# Patient Record
Sex: Female | Born: 1965
Health system: Southern US, Community
[De-identification: ages and names within clinical notes are randomized; demographics above are authoritative.]

## PROBLEM LIST (undated history)

## (undated) ENCOUNTER — Emergency Department (HOSPITAL_COMMUNITY): Payer: MEDICAID

## (undated) DIAGNOSIS — F3112 Bipolar disorder, current episode manic without psychotic features, moderate: Secondary | ICD-10-CM

## (undated) DIAGNOSIS — I10 Essential (primary) hypertension: Secondary | ICD-10-CM

## (undated) HISTORY — DX: Bipolar disorder, current episode manic without psychotic features, moderate: F31.12

---

## 2000-10-03 ENCOUNTER — Emergency Department (HOSPITAL_COMMUNITY): Admission: EM | Admit: 2000-10-03 | Discharge: 2000-10-03 | Payer: Self-pay | Admitting: Emergency Medicine

## 2001-11-08 ENCOUNTER — Emergency Department (HOSPITAL_COMMUNITY): Admission: EM | Admit: 2001-11-08 | Discharge: 2001-11-09 | Payer: Self-pay | Admitting: *Deleted

## 2005-04-19 ENCOUNTER — Emergency Department (HOSPITAL_COMMUNITY): Admission: EM | Admit: 2005-04-19 | Discharge: 2005-04-19 | Payer: Self-pay | Admitting: Emergency Medicine

## 2008-10-22 ENCOUNTER — Emergency Department (HOSPITAL_COMMUNITY): Admission: EM | Admit: 2008-10-22 | Discharge: 2008-10-23 | Payer: Self-pay | Admitting: Emergency Medicine

## 2013-06-11 ENCOUNTER — Ambulatory Visit: Payer: Self-pay

## 2013-09-05 DIAGNOSIS — Y929 Unspecified place or not applicable: Secondary | ICD-10-CM | POA: Insufficient documentation

## 2013-09-05 DIAGNOSIS — IMO0002 Reserved for concepts with insufficient information to code with codable children: Secondary | ICD-10-CM | POA: Insufficient documentation

## 2013-09-05 DIAGNOSIS — Y9389 Activity, other specified: Secondary | ICD-10-CM | POA: Insufficient documentation

## 2013-09-05 DIAGNOSIS — F17200 Nicotine dependence, unspecified, uncomplicated: Secondary | ICD-10-CM | POA: Insufficient documentation

## 2013-09-05 DIAGNOSIS — S0990XA Unspecified injury of head, initial encounter: Secondary | ICD-10-CM | POA: Insufficient documentation

## 2013-09-06 ENCOUNTER — Encounter (HOSPITAL_COMMUNITY): Payer: Self-pay | Admitting: Emergency Medicine

## 2013-09-06 ENCOUNTER — Emergency Department (HOSPITAL_COMMUNITY)
Admission: EM | Admit: 2013-09-06 | Discharge: 2013-09-06 | Discharge status: Against Medical Advice | Payer: No Typology Code available for payment source | Attending: Emergency Medicine | Admitting: Emergency Medicine
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
## 2013-09-06 NOTE — ED Notes (Signed)
Informed that pt decided to leave and walked out. 

## 2013-12-30 ENCOUNTER — Encounter: Payer: Self-pay | Admitting: Internal Medicine

## 2013-12-30 ENCOUNTER — Ambulatory Visit: Payer: No Typology Code available for payment source | Attending: Internal Medicine | Admitting: Internal Medicine

## 2013-12-30 VITALS — BP 121/80 | HR 73 | Temp 97.9°F | Resp 14 | Ht 66.0 in | Wt 145.8 lb

## 2013-12-30 DIAGNOSIS — Z23 Encounter for immunization: Secondary | ICD-10-CM

## 2013-12-30 DIAGNOSIS — R6882 Decreased libido: Secondary | ICD-10-CM | POA: Insufficient documentation

## 2013-12-30 DIAGNOSIS — F3112 Bipolar disorder, current episode manic without psychotic features, moderate: Secondary | ICD-10-CM

## 2013-12-30 DIAGNOSIS — Z0000 Encounter for general adult medical examination without abnormal findings: Secondary | ICD-10-CM | POA: Insufficient documentation

## 2013-12-30 DIAGNOSIS — F319 Bipolar disorder, unspecified: Secondary | ICD-10-CM | POA: Insufficient documentation

## 2013-12-30 DIAGNOSIS — R32 Unspecified urinary incontinence: Secondary | ICD-10-CM | POA: Insufficient documentation

## 2013-12-30 DIAGNOSIS — N3941 Urge incontinence: Secondary | ICD-10-CM | POA: Insufficient documentation

## 2013-12-30 HISTORY — DX: Bipolar disorder, current episode manic without psychotic features, moderate: F31.12

## 2013-12-30 LAB — CBC WITH DIFFERENTIAL/PLATELET
BASOS ABS: 0 10*3/uL (ref 0.0–0.1)
BASOS PCT: 1 % (ref 0–1)
Eosinophils Absolute: 0.1 10*3/uL (ref 0.0–0.7)
Eosinophils Relative: 2 % (ref 0–5)
HEMATOCRIT: 40.1 % (ref 36.0–46.0)
Hemoglobin: 13.8 g/dL (ref 12.0–15.0)
Lymphocytes Relative: 43 % (ref 12–46)
Lymphs Abs: 1.9 10*3/uL (ref 0.7–4.0)
MCH: 30.5 pg (ref 26.0–34.0)
MCHC: 34.4 g/dL (ref 30.0–36.0)
MCV: 88.5 fL (ref 78.0–100.0)
MONO ABS: 0.2 10*3/uL (ref 0.1–1.0)
Monocytes Relative: 5 % (ref 3–12)
NEUTROS ABS: 2.2 10*3/uL (ref 1.7–7.7)
NEUTROS PCT: 49 % (ref 43–77)
Platelets: 269 10*3/uL (ref 150–400)
RBC: 4.53 MIL/uL (ref 3.87–5.11)
RDW: 14.5 % (ref 11.5–15.5)
WBC: 4.5 10*3/uL (ref 4.0–10.5)

## 2013-12-30 LAB — COMPLETE METABOLIC PANEL WITH GFR
ALBUMIN: 4.2 g/dL (ref 3.5–5.2)
ALK PHOS: 63 U/L (ref 39–117)
ALT: 13 U/L (ref 0–35)
AST: 21 U/L (ref 0–37)
BUN: 12 mg/dL (ref 6–23)
CO2: 25 mEq/L (ref 19–32)
Calcium: 8.9 mg/dL (ref 8.4–10.5)
Chloride: 103 mEq/L (ref 96–112)
Creat: 0.97 mg/dL (ref 0.50–1.10)
GFR, Est African American: 81 mL/min
GFR, Est Non African American: 70 mL/min
Glucose, Bld: 92 mg/dL (ref 70–99)
POTASSIUM: 4.3 meq/L (ref 3.5–5.3)
Sodium: 139 mEq/L (ref 135–145)
Total Bilirubin: 0.5 mg/dL (ref 0.2–1.2)
Total Protein: 7.3 g/dL (ref 6.0–8.3)

## 2013-12-30 LAB — LIPID PANEL
CHOL/HDL RATIO: 3.6 ratio
CHOLESTEROL: 178 mg/dL (ref 0–200)
HDL: 49 mg/dL (ref >39)
LDL Cholesterol: 110 mg/dL — ABNORMAL HIGH (ref 0–99)
TRIGLYCERIDES: 94 mg/dL (ref <150)
VLDL: 19 mg/dL (ref 0–40)

## 2013-12-30 LAB — POCT GLYCOSYLATED HEMOGLOBIN (HGB A1C): HEMOGLOBIN A1C: 5.6

## 2013-12-30 MED ORDER — CARBAMIDE PEROXIDE 6.5 % OT SOLN: 5.0000 [drp] | Freq: Two times a day (BID) | OTIC | Status: DC

## 2013-12-30 NOTE — Progress Notes (Signed)
Patient presents to establish care C/O urinary incontinence for 2 years C/o absent libido for 1 year

## 2013-12-30 NOTE — Progress Notes (Signed)
Subjective:     Patient ID: Natalie Brown, female   DOB: 1965/07/17, 48 y.o.   MRN: [id number]  HPI  Ryanna Teschner is a 48y AA female who presents today to establish medical care.  She was previously seen by [facility] primary care in 2013.  She complains of urinary incontinence for the last 1 year.  She wears incontinence briefs and endorses numerous incontinence episodes daily.  At night, she reports wakening with "rapid heart rate" leading to an incontinent episode.  She does drink coffee, tea, and water "all day long."  She also reports amenorrhea for the last 2 years.  No family history of early menopause.  She smokes 5-6 cigarettes a day but does not "inhale."  She denies sexual activity for "several years."  No PMH, no current medications other than vitamin D3. She denies additional complaints.      Review of Systems  Constitutional: Negative.   HENT: Negative.   Eyes: Negative.   Respiratory: Negative.   Cardiovascular: Positive for palpitations.       Awakens at night with palpitations  Gastrointestinal: Negative.   Endocrine: Positive for polyuria.  Genitourinary: Positive for urgency and menstrual problem.       Amenorrhea  Musculoskeletal: Negative.   Skin: Negative.   Allergic/Immunologic: Negative.   Neurological: Negative.   Hematological: Negative.   Psychiatric/Behavioral: Negative.        Objective:   Physical Exam  Constitutional: [name] oriented to person, place, and time. She appears well-developed and well-nourished. No distress.  HENT:  Head: Atraumatic.  Bilateral cerumen   Eyes: Conjunctivae and EOM are normal. Pupils are equal, round, and reactive to light. Right eye exhibits no discharge. Left eye exhibits no discharge.  Neck: Neck supple. No JVD present. No tracheal deviation present. No thyromegaly present.  Cardiovascular: Regular rhythm and normal heart sounds.  Exam reveals no friction rub.   No murmur heard. Pulmonary/Chest: Effort normal and breath  sounds normal.  Abdominal: Bowel sounds are normal. She exhibits no distension and no mass. There is no tenderness. There is no rebound and no guarding.  Musculoskeletal: Normal range of motion.  Lymphadenopathy:    She has no cervical adenopathy.  Neurological: [name] alert and oriented to person, place, and time. She displays normal reflexes.  Skin: Skin is warm and dry.  Psychiatric: Her speech is tangential. [name] hyperactive.       Assessment:         Plan:     Assessment and plan:  Urinary incontinence:  Denies h/o DM.  Encouraged decrease in caffienated beverages.  U/A to r/o source of infection,  CMET to assess renal fxn, glucose.    Bipolar type 1 with mania:  Pt referred to psychiatry for eval and treatment of her bipolar presentation.    Menopause:  Explained to pt that this is not uncommon given her age.  Will monitor.  Smoking:  Pt reports smoking 5-6 cigarettes a day but does not endorse being a smoker because she  does not "inhale."  Encouraged cessation.  Declined cessation materials.  Preventative care:  CBC, TSH, lipids to establish baseline values as pt has no medical records available for  Review.  Pt prefers female provider complete pelvic exam with PAP smear.  Will schedule visit  in 2-4 weeks for examination.  Will also perform thorough genital exam to r/o physical cause of  urinary incontinence.  Encouraged pt to consider Tdap vaccination as she has not had one  since childhood.  Pt will consider.     Alija Riano, FNP-student 12/30/2013  12:43 PM  Evaluation and management procedures were performed by me and NP student. I have reviewed the Advanced Practitioner student's note and chart, and I agree with the documentation, management and plan.   Jeanann Lewandowsky, MD, MHA, [facility] [place], [place] [phone]

## 2013-12-31 LAB — URINALYSIS, COMPLETE
BACTERIA UA: NONE SEEN
BILIRUBIN URINE: NEGATIVE
CASTS: NONE SEEN
Crystals: NONE SEEN
GLUCOSE, UA: NEGATIVE mg/dL
HGB URINE DIPSTICK: NEGATIVE
KETONES UR: NEGATIVE mg/dL
Leukocytes, UA: NEGATIVE
Nitrite: NEGATIVE
PH: 7 (ref 5.0–8.0)
Protein, ur: NEGATIVE mg/dL
Specific Gravity, Urine: 1.016 (ref 1.005–1.030)
Squamous Epithelial / LPF: NONE SEEN
Urobilinogen, UA: 1 mg/dL (ref 0.0–1.0)

## 2013-12-31 LAB — TSH: TSH: 1.412 u[IU]/mL (ref 0.350–4.500)

## 2014-01-14 ENCOUNTER — Telehealth: Payer: Self-pay | Admitting: Emergency Medicine

## 2014-01-14 NOTE — Telephone Encounter (Signed)
Attempted to reach pt with test results;number listed invalid

## 2014-01-14 NOTE — Telephone Encounter (Signed)
Message copied by Darlis Loan on Fri Jan 14, 2014  5:04 PM ------      Message from: Jeanann Lewandowsky E      Created: Wed Jan 12, 2014 10:05 AM       Please inform patient that her laboratory tests results are mostly within normal limit except for her cholesterol that is slightly high. Please advise patient to adhere with low-cholesterol, low-fat diet and regular physical exercise at least 3 times a week 30 minutes each time. ------

## 2014-02-02 ENCOUNTER — Encounter (INDEPENDENT_AMBULATORY_CARE_PROVIDER_SITE_OTHER): Payer: Self-pay

## 2014-02-02 ENCOUNTER — Telehealth: Payer: Self-pay | Admitting: Internal Medicine

## 2014-02-02 ENCOUNTER — Encounter: Payer: Self-pay | Admitting: *Deleted

## 2014-02-02 NOTE — Progress Notes (Signed)
Pt is aware of her lab results.  

## 2014-02-02 NOTE — Telephone Encounter (Signed)
Patient has come in today to request lab results; please f/u with patient on her cell phone

## 2014-02-14 ENCOUNTER — Ambulatory Visit: Payer: No Typology Code available for payment source | Admitting: Family Medicine

## 2014-02-15 ENCOUNTER — Ambulatory Visit: Payer: No Typology Code available for payment source | Admitting: Family Medicine

## 2014-02-22 ENCOUNTER — Ambulatory Visit: Payer: No Typology Code available for payment source | Attending: Family Medicine | Admitting: Family Medicine

## 2014-02-22 ENCOUNTER — Other Ambulatory Visit (HOSPITAL_COMMUNITY)
Admission: RE | Admit: 2014-02-22 | Discharge: 2014-02-22 | Disposition: A | Discharge status: Home / Self Care | Payer: No Typology Code available for payment source | Source: Ambulatory Visit | Attending: Family Medicine | Admitting: Family Medicine

## 2014-02-22 ENCOUNTER — Encounter: Payer: Self-pay | Admitting: Family Medicine

## 2014-02-22 VITALS — BP 122/75 | HR 74 | Temp 98.1°F | Resp 18 | Ht 66.0 in | Wt 139.0 lb

## 2014-02-22 DIAGNOSIS — Z01419 Encounter for gynecological examination (general) (routine) without abnormal findings: Secondary | ICD-10-CM | POA: Insufficient documentation

## 2014-02-22 DIAGNOSIS — Z01411 Encounter for gynecological examination (general) (routine) with abnormal findings: Secondary | ICD-10-CM | POA: Insufficient documentation

## 2014-02-22 DIAGNOSIS — F17200 Nicotine dependence, unspecified, uncomplicated: Secondary | ICD-10-CM | POA: Insufficient documentation

## 2014-02-22 DIAGNOSIS — Z113 Encounter for screening for infections with a predominantly sexual mode of transmission: Secondary | ICD-10-CM | POA: Insufficient documentation

## 2014-02-22 DIAGNOSIS — Z1151 Encounter for screening for human papillomavirus (HPV): Secondary | ICD-10-CM | POA: Insufficient documentation

## 2014-02-22 DIAGNOSIS — N760 Acute vaginitis: Secondary | ICD-10-CM | POA: Insufficient documentation

## 2014-02-22 DIAGNOSIS — Z124 Encounter for screening for malignant neoplasm of cervix: Secondary | ICD-10-CM | POA: Insufficient documentation

## 2014-02-22 DIAGNOSIS — Z720 Tobacco use: Secondary | ICD-10-CM | POA: Insufficient documentation

## 2014-02-22 HISTORY — DX: Nicotine dependence, unspecified, uncomplicated: F17.200

## 2014-02-22 NOTE — Assessment & Plan Note (Signed)
Cessation information given today

## 2014-02-22 NOTE — Progress Notes (Signed)
   Subjective:    Patient ID: Natalie PutnamAngela M Brown, female    DOB: 02-May-1965, 48 y.o.   MRN: [id number] CC: pap smear  HPI  48 yo F presents for pap. She has no vaginal discharge or complaints. No history of abnormal pap smears.   Soc hx: smoker   Review of Systems As per HPI     Objective:   Physical Exam BP 122/75 mmHg  Pulse 74  Temp(Src) 98.1 F (36.7 C) (Oral)  Resp 18  Ht 5\' 6"  (1.676 m)  Wt 139 lb (63.05 kg)  BMI 22.45 kg/m2  SpO2 97% General appearance: alert, cooperative and no distress Pelvic: cervix normal in appearance, external genitalia normal, no adnexal masses or tenderness, no cervical motion tenderness, rectovaginal septum normal, uterus normal size, shape, and consistency and vagina normal without discharge  Rectal:      Assessment & Plan:

## 2014-02-22 NOTE — Assessment & Plan Note (Addendum)
Normal exam  Pap done today

## 2014-02-22 NOTE — Progress Notes (Signed)
Annual pap smear No vaginal discharge

## 2014-02-22 NOTE — Patient Instructions (Addendum)
[name],  Thank you for coming in today. Your pap smear was done today. This is routine screening for cervical cancer.  You will receive a call with pap results.  If normal, repeat pap is recommended in 3 years.  Smoking cessation support: smoking cessation hotline: 1-800-QUIT-NOW.  Smoking cessation classes are available through [facility]. Call [phone] or visit our website at [email or web address].   Dr. Armen PickupFunches

## 2014-02-23 LAB — CERVICOVAGINAL ANCILLARY ONLY
Chlamydia: NEGATIVE
Neisseria Gonorrhea: NEGATIVE
Wet Prep (BD Affirm): NEGATIVE
Wet Prep (BD Affirm): NEGATIVE
Wet Prep (BD Affirm): NEGATIVE

## 2014-02-23 LAB — CYTOLOGY - PAP

## 2014-02-24 ENCOUNTER — Ambulatory Visit: Payer: No Typology Code available for payment source | Attending: Internal Medicine

## 2014-02-28 ENCOUNTER — Telehealth: Payer: Self-pay | Admitting: *Deleted

## 2014-02-28 NOTE — Telephone Encounter (Signed)
Unable to contact patient.  Both number listed are disconnected, number called wrong number.

## 2014-02-28 NOTE — Telephone Encounter (Signed)
-----   Message from Lora PaulaJosalyn C Funches, MD sent at 02/23/2014  9:06 AM EST ----- Negative wet prep. Pap pending.

## 2014-09-30 ENCOUNTER — Emergency Department (HOSPITAL_COMMUNITY)
Admission: EM | Admit: 2014-09-30 | Discharge: 2014-09-30 | Disposition: A | Discharge status: Home / Self Care | Payer: No Typology Code available for payment source | Source: Home / Self Care | Attending: Family Medicine | Admitting: Family Medicine

## 2014-09-30 ENCOUNTER — Encounter (HOSPITAL_COMMUNITY): Payer: Self-pay | Admitting: *Deleted

## 2014-09-30 DIAGNOSIS — R21 Rash and other nonspecific skin eruption: Secondary | ICD-10-CM

## 2014-09-30 MED ORDER — NYSTATIN 100000 UNIT/GM EX CREA: TOPICAL_CREAM | CUTANEOUS | Status: DC

## 2014-09-30 NOTE — ED Notes (Signed)
Pt is here with complaints of breast rash and itching.

## 2014-09-30 NOTE — ED Provider Notes (Signed)
Natalie Brown is a 49 y.o. female who presents to Urgent Care today for rash. Patient has an itchy rash between her breasts. This is been present for about a month. She's tried some prescriptions. Cream which helped a bit. She feels well with no fevers or chills.   History reviewed. No pertinent past medical history. No past surgical history on file. History  Substance Use Topics  . Smoking status: Current Every Day Smoker  . Smokeless tobacco: Not on file  . Alcohol Use: No   ROS as above Medications: No current facility-administered medications for this encounter.   Current Outpatient Prescriptions  Medication Sig Dispense Refill  . carbamide peroxide (EAR WAX REMOVAL DROPS) 6.5 % otic solution Place 5 drops into both ears 2 (two) times daily. 15 mL 0  . nystatin cream (MYCOSTATIN) Apply to affected area 2 times daily 30 g 1   Allergies  Allergen Reactions  . Penicillins Itching     Exam:  BP 117/68 mmHg  Pulse 92  Temp(Src) 98.8 F (37.1 C) (Oral)  Resp 16  SpO2 98% Gen: Well NAD HEENT: EOMI,  MMM Lungs: Normal work of breathing. CTABL Heart: RRR no MRG Abd: NABS, Soft. Nondistended, Nontender Exts: Brisk capillary refill, warm and well perfused.  Skin: Area between the breasts is very minimally hyperpigmented and with some excoriations. No obvious rash present  No results found for this or any previous visit (from the past 24 hour(s)). No results found.  Assessment and Plan: 49 y.o. female with intertrigo possibly. Patient has been using corticosteroids therefore the rash is not particularly visible. Trial of nystatin cream. Follow-up with PCP.  Discussed warning signs or symptoms. Please see discharge instructions. Patient expresses understanding.     Rodolph Bong, MD 09/30/14 [phone]

## 2014-09-30 NOTE — Discharge Instructions (Signed)
Thank you for coming in today. Intertrigo Intertrigo is a skin condition that occurs in between folds of skin in places on the body that rub together a lot and do not get much ventilation. It is caused by heat, moisture, friction, sweat retention, and lack of air circulation, which produces red, irritated patches and, sometimes, scaling or drainage. People who have diabetes, who are obese, or who have treatment with antibiotics are at increased risk for intertrigo. The most common sites for intertrigo to occur include:  The groin.  The breasts.  The armpits.  Folds of abdominal skin.  Webbed spaces between the fingers or toes. Intertrigo may be aggravated by:  Sweat.  Feces.  Yeast or bacteria that are present near skin folds.  Urine.  Vaginal discharge. HOME CARE INSTRUCTIONS  The following steps can be taken to reduce friction and keep the affected area cool and dry:  Expose skin folds to the air.  Keep deep skin folds separated with cotton or linen cloth. Avoid tight fitting clothing that could cause chafing.  Wear open-toed shoes or sandals to help reduce moisture between the toes.  Apply absorbent powders to affected areas as directed by your caregiver.  Apply over-the-counter barrier pastes, such as zinc oxide, as directed by your caregiver.  If you develop a fungal infection in the affected area, your caregiver may have you use antifungal creams. SEEK MEDICAL CARE IF:   The rash is not improving after 1 week of treatment.  The rash is getting worse (more red, more swollen, more painful, or spreading).  You have a fever or chills. MAKE SURE YOU:   Understand these instructions.  Will watch your condition.  Will get help right away if you are not doing well or get worse. Document Released: 04/08/2005 Document Revised: 07/01/2011 Document Reviewed: 09/21/2009 [facility] Patient Information 2015 [place], [place]. This information is not intended to replace  advice given to you by your health care provider. Make sure you discuss any questions you have with your health care provider.

## 2014-10-27 ENCOUNTER — Ambulatory Visit: Payer: Self-pay | Admitting: Internal Medicine

## 2015-06-19 ENCOUNTER — Ambulatory Visit: Payer: No Typology Code available for payment source

## 2015-06-28 ENCOUNTER — Ambulatory Visit: Payer: No Typology Code available for payment source | Attending: Internal Medicine

## 2015-06-29 ENCOUNTER — Encounter: Payer: Self-pay | Admitting: Internal Medicine

## 2015-06-29 ENCOUNTER — Ambulatory Visit: Payer: No Typology Code available for payment source | Attending: Internal Medicine | Admitting: Internal Medicine

## 2015-06-29 VITALS — BP 117/76 | HR 100 | Temp 98.2°F | Resp 18 | Ht 66.0 in | Wt 140.0 lb

## 2015-06-29 DIAGNOSIS — N3941 Urge incontinence: Secondary | ICD-10-CM | POA: Insufficient documentation

## 2015-06-29 DIAGNOSIS — Z0000 Encounter for general adult medical examination without abnormal findings: Secondary | ICD-10-CM

## 2015-06-29 DIAGNOSIS — Z23 Encounter for immunization: Secondary | ICD-10-CM | POA: Insufficient documentation

## 2015-06-29 DIAGNOSIS — F17200 Nicotine dependence, unspecified, uncomplicated: Secondary | ICD-10-CM

## 2015-06-29 DIAGNOSIS — Z880 Allergy status to penicillin: Secondary | ICD-10-CM | POA: Insufficient documentation

## 2015-06-29 DIAGNOSIS — F3112 Bipolar disorder, current episode manic without psychotic features, moderate: Secondary | ICD-10-CM

## 2015-06-29 DIAGNOSIS — F319 Bipolar disorder, unspecified: Secondary | ICD-10-CM | POA: Insufficient documentation

## 2015-06-29 DIAGNOSIS — Z720 Tobacco use: Secondary | ICD-10-CM

## 2015-06-29 DIAGNOSIS — K029 Dental caries, unspecified: Secondary | ICD-10-CM | POA: Insufficient documentation

## 2015-06-29 MED ORDER — VARENICLINE TARTRATE 0.5 MG X 11 & 1 MG X 42 PO MISC: ORAL | Status: DC

## 2015-06-29 NOTE — Progress Notes (Signed)
Patient is here for Dental Referral  Patient would like the flu shot today. Patient tolerated flu shot well.

## 2015-06-29 NOTE — Patient Instructions (Signed)
Dental Caries °Dental caries (also called tooth decay) is the most common oral disease. It can occur at any age but is more common in children and young adults.  °HOW DENTAL CARIES DEVELOPS  °The process of decay begins when bacteria and foods (particularly sugars and starches) combine in your mouth to produce plaque. Plaque is a substance that sticks to the hard, outer surface of a tooth (enamel). The bacteria in plaque produce acids that attack enamel. These acids may also attack the root surface of a tooth (cementum) if it is exposed. Repeated attacks dissolve these surfaces and create holes in the tooth (cavities). If left untreated, the acids destroy the other layers of the tooth.  °RISK FACTORS °· Frequent sipping of sugary beverages.   °· Frequent snacking on sugary and starchy foods, especially those that easily get stuck in the teeth.   °· Poor oral hygiene.   °· Dry mouth.   °· Substance abuse such as methamphetamine abuse.   °· Broken or poor-fitting dental restorations.   °· Eating disorders.   °· Gastroesophageal reflux disease (GERD).   °· Certain radiation treatments to the head and neck. °SYMPTOMS °In the early stages of dental caries, symptoms are seldom present. Sometimes white, chalky areas may be seen on the enamel or other tooth layers. In later stages, symptoms may include: °· Pits and holes on the enamel. °· Toothache after sweet, hot, or cold foods or drinks are consumed. °· Pain around the tooth. °· Swelling around the tooth. °DIAGNOSIS  °Most of the time, dental caries is detected during a regular dental checkup. A diagnosis is made after a thorough medical and dental history is taken and the surfaces of your teeth are checked for signs of dental caries. Sometimes special instruments, such as lasers, are used to check for dental caries. Dental X-ray exams may be taken so that areas not visible to the eye (such as between the contact areas of the teeth) can be checked for cavities.    °TREATMENT  °If dental caries is in its early stages, it may be reversed with a fluoride treatment or an application of a remineralizing agent at the dental office. Thorough brushing and flossing at home is needed to aid these treatments. If it is in its later stages, treatment depends on the location and extent of tooth destruction:  °· If a small area of the tooth has been destroyed, the destroyed area will be removed and cavities will be filled with a material such as gold, silver amalgam, or composite resin.   °· If a large area of the tooth has been destroyed, the destroyed area will be removed and a cap (crown) will be fitted over the remaining tooth structure.   °· If the center part of the tooth (pulp) is affected, a procedure called a root canal will be needed before a filling or crown can be placed.   °· If most of the tooth has been destroyed, the tooth may need to be pulled (extracted). °HOME CARE INSTRUCTIONS °You can prevent, stop, or reverse dental caries at home by practicing good oral hygiene. Good oral hygiene includes: °· Thoroughly cleaning your teeth at least twice a day with a toothbrush and dental floss.   °· Using a fluoride toothpaste. A fluoride mouth rinse may also be used if recommended by your dentist or health care provider.   °· Restricting the amount of sugary and starchy foods and sugary liquids you consume.   °· Avoiding frequent snacking on these foods and sipping of these liquids.   °· Keeping regular visits with   a dentist for checkups and cleanings. °PREVENTION  °· Practice good oral hygiene. °· Consider a dental sealant. A dental sealant is a coating material that is applied by your dentist to the pits and grooves of teeth. The sealant prevents food from being trapped in them. It may protect the teeth for several years. °· Ask about fluoride supplements if you live in a community without fluorinated water or with water that has a low fluoride content. Use fluoride supplements  as directed by your dentist or health care provider. °· Allow fluoride varnish applications to teeth if directed by your dentist or health care provider. °  °This information is not intended to replace advice given to you by your health care provider. Make sure you discuss any questions you have with your health care provider. °  °Document Released: 12/29/2001 Document Revised: 04/29/2014 Document Reviewed: 04/10/2012 °Elsevier Interactive Patient Education ©2016 Elsevier Inc. ° °

## 2015-06-29 NOTE — Progress Notes (Signed)
Natalie Brown, is a 50 y.o. female  ZOX:[id number]  [name]:[id number]  DOB - 1965-08-28  CC:  Chief Complaint  Patient presents with  . Referral    dental       HPI: Natalie Brown is a 50 y.o. female here today for tooth pain and dental referral. Patient has history of tobacco dependence, urge incontinence, bipolar 1 disorder w/moderate mania no medication at this time. Patient with complaints of ongoing left lower tooth pain. Patient states she lost her retainer wore braces for 5 years, would like retainer replaced. Patient lives at [employer] [facility] for past month. Patient has no other complaints today. Patient to have influenzae vaccine today. Patient has No headache, No chest pain, No abdominal pain - No Nausea, No new weakness tingling or numbness, No Cough - SOB.  Allergies  Allergen Reactions  . Penicillins Itching   History reviewed. No pertinent past medical history. No current outpatient prescriptions on Brown prior to visit.   No current facility-administered medications on Brown prior to visit.   History reviewed. No pertinent family history. Social History   Social History  . Marital Status: Married    Spouse Name: N/A  . Number of Children: N/A  . Years of Education: N/A   Occupational History  . Not on Brown.   Social History Main Topics  . Smoking status: Current Every Day Smoker  . Smokeless tobacco: Not on Brown  . Alcohol Use: No  . Drug Use: No  . Sexual Activity: Not on Brown   Other Topics Concern  . Not on Brown   Social History Narrative    Review of Systems: Constitutional: Negative for fever, chills, diaphoresis, activity change, appetite change and fatigue. HENT: Negative for ear pain, nosebleeds, congestion, facial swelling, rhinorrhea, neck pain, neck stiffness and ear discharge.  Eyes: Negative for pain, discharge, redness, itching and visual disturbance. Respiratory: Negative for cough, choking, chest tightness, shortness of breath,  wheezing and stridor.  Cardiovascular: Negative for chest pain, palpitations and leg swelling. Gastrointestinal: Negative for abdominal distention. Genitourinary: Negative for dysuria, urgency, frequency, hematuria, flank pain, decreased urine volume, difficulty urinating and dyspareunia.  Musculoskeletal: Negative for back pain, joint swelling, arthralgia and gait problem. Neurological: Negative for dizziness, tremors, seizures, syncope, facial asymmetry, speech difficulty, weakness, light-headedness, numbness and headaches.  Hematological: Negative for adenopathy. Does not bruise/bleed easily. Psychiatric/Behavioral: Negative for hallucinations, behavioral problems, confusion, dysphoric mood, decreased concentration and agitation.    Objective:   Filed Vitals:   06/29/15 1517  BP: 117/76  Pulse: 100  Temp: 98.2 F (36.8 C)  Resp: 18    Physical Exam: Constitutional: Patient appears well-developed and well-nourished. No distress. HENT: Normocephalic, atraumatic, External right and left ear normal. Oropharynx is clear and moist. Positive for tooth pain lower left, no abscess apparent Neck: Normal ROM. Neck supple. No JVD. No tracheal deviation. No thyromegaly. CVS: RRR, S1/S2 +, no murmurs, no gallops, no carotid bruit.  Pulmonary: Effort and breath sounds normal, no stridor, rhonchi, wheezes, rales.  Lymphadenopathy: No lymphadenopathy noted Neuro: Alert & Oriented x 4 Skin: Skin is warm and dry. No rash noted. Not diaphoretic. No erythema. No pallor. Psychiatric: Normal mood and affect. Behavior, judgment, thought content normal.  Lab Results  Component Value Date   WBC 4.5 12/30/2013   HGB 13.8 12/30/2013   HCT 40.1 12/30/2013   MCV 88.5 12/30/2013   PLT 269 12/30/2013   Lab Results  Component Value Date   CREATININE 0.97 12/30/2013   BUN 12  12/30/2013   NA 139 12/30/2013   K 4.3 12/30/2013   CL 103 12/30/2013   CO2 25 12/30/2013    Lab Results  Component Value  Date   HGBA1C 5.6 12/30/2013   Lipid Panel     Component Value Date/Time   CHOL 178 12/30/2013 1216   TRIG 94 12/30/2013 1216   HDL 49 12/30/2013 1216   CHOLHDL 3.6 12/30/2013 1216   VLDL 19 12/30/2013 1216   LDLCALC 110* 12/30/2013 1216       Assessment and plan:   Natalie Brown was seen today for referral.  Diagnoses and all orders for this visit:  Healthcare maintenance  -     POCT A1C -     Flu Vaccine QUAD 36+ mos PF IM (Fluarix & Fluzone Quad PF)  Current smoker -     varenicline (CHANTIX STARTING MONTH PAK) 0.5 MG X 11 & 1 MG X 42 tablet; Take one 0.5 mg tablet by mouth once daily for 3 days, then increase to one 0.5 mg tablet twice daily for 4 days, then increase to one 1 mg tablet twice daily.   Natalie Brown was counseled on the dangers of tobacco use, and was advised to quit. Reviewed strategies to maximize success, including removing cigarettes and smoking materials from environment, stress management and support of family/friends.   Bipolar 1 disorder with moderate mania (HCC)   Follow-up with behavioral health.  Patient was seen by our licensed clinical social worker today For counseling.  Dental caries  -     Ambulatory referral to Dentistry  Return in about 6 months (around 12/30/2015) for Annual Physical.  The patient was given clear instructions to go to ER or return to medical center if symptoms don't improve, worsen or new problems develop. The patient verbalized understanding.   Stephanie CoupLou Ann Earnhardt, AGNP-Student [facility] [phone] 06/29/2015, 3:41 PM  Evaluation and management procedures were performed by the Advanced Practitioner under my supervision and collaboration. I have reviewed the Advanced Practitioner's note and chart, and I agree with the management and plan.   Jeanann LewandowskyJEGEDE, Becci Batty, MD, MHA, CPE, FACP, FAAP [facility] [place], [place] [phone]   06/29/2015, 7:12 PM

## 2015-08-17 DIAGNOSIS — Z719 Counseling, unspecified: Secondary | ICD-10-CM

## 2015-08-30 NOTE — Congregational Nurse Program (Signed)
Congregational Nurse Program Note  Date of Encounter: 08/17/2015  Past Medical History: No past medical history on file.  Encounter Details:     CNP Questionnaire - 08/30/15 0100    Patient Demographics   Patient is considered a/an Not Applicable   Race African-American/Black   Patient Assistance   Location of [facility]   Patient's financial/insurance status Low Income;[place] Card/Care Connects   Uninsured Patient No   Patient referred to apply for the following financial assistance Not Applicable   Food insecurities addressed Not Applicable   Transportation assistance Yes   Type of Assistance Bus Pass Given   Assistance securing medications No   Educational health offerings Interpersonal relationships;Behavioral health;Health literacy   Encounter Details   Primary purpose of visit Education/Health Concerns   Was an Emergency Department visit averted? No   Does patient have a medical provider? Yes   Patient referred to Not Applicable   Was a mental health screening completed? (GAINS tool) No   Does patient have dental issues? Yes   Was a dental referral made? No resources for a referral   Does patient have vision issues? No   Does your patient have an abnormal blood pressure today? No   Since previous encounter, have you referred patient for abnormal blood pressure that resulted in a new diagnosis or medication change? No   Does your patient have an abnormal blood glucose today? No   Since previous encounter, have you referred patient for abnormal blood glucose that resulted in a new diagnosis or medication change? No   Was there a life-saving intervention made? No    [facility].  Taking job readiness class.  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  Client vague about where she worked and her level of education.  [place]  [name] wanting to get her license reinstated and  is [facility] or [employer] to get this.  Seemed to have flight of ideas during our brief encounter.  Will continue to engage client as I can get her to converse with me.

## 2015-09-04 DIAGNOSIS — Z719 Counseling, unspecified: Secondary | ICD-10-CM

## 2015-09-05 NOTE — Congregational Nurse Program (Signed)
Congregational Nurse Program Note  Date of Encounter: 09/04/2015  Past Medical History: No past medical history on file.  Encounter Details:     CNP Questionnaire - 09/04/15 2348    Patient Demographics   Is this a new or existing patient? Existing   Patient is considered a/an Not Applicable   Race African-American/Black   Patient Assistance   [facility]   Patient's financial/insurance status Low Income;[place]/[facility]   Uninsured Patient No   Patient referred to apply for the following financial assistance Not Applicable   Food insecurities addressed Not Applicable   Transportation assistance Yes   Type of Assistance Bus Pass Given   Assistance securing medications No   Educational health offerings Interpersonal relationships;Behavioral health;Health literacy   Encounter Details   Primary purpose of visit Education/Health Concerns   Was an Emergency Department visit averted? No   Does patient have a medical provider? Yes   Patient referred to Not Applicable   Was a mental health screening completed? (GAINS tool) No   Does patient have dental issues? Yes   Was a dental referral made? No resources for a referral   Does patient have vision issues? No   Does your patient have an abnormal blood pressure today? No   Since previous encounter, have you referred patient for abnormal blood pressure that resulted in a new diagnosis or medication change? No   Does your patient have an abnormal blood glucose today? No   Since previous encounter, have you referred patient for abnormal blood glucose that resulted in a new diagnosis or medication change? No   Was there a life-saving intervention made? No     Client attended [employer].  Client seemed to be staying away from the group and approached staff and other people with excited and somewhat inappropriote conversation.  [facility] has a great deal of difficulty  focusing and learning.  I will talk with her on Thursday as this setting is inappropriate to approach client.

## 2015-10-20 DIAGNOSIS — Z719 Counseling, unspecified: Secondary | ICD-10-CM

## 2015-10-27 NOTE — Congregational Nurse Program (Signed)
Congregational Nurse Program Note  Date of Encounter: 10/20/2015  Past Medical History: No past medical history on file.  Encounter Details:     CNP Questionnaire - 10/20/15 0026    Patient Demographics   Is this a new or existing patient? Existing   Patient is considered a/an Not Applicable   Race African-American/Black   Patient Assistance   [facility]   Patient's financial/insurance status Low Income;[place] [employer]/Care Connects   Uninsured Patient No   Patient referred to apply for the following financial assistance Not Applicable   Food insecurities addressed Not Applicable   Transportation assistance Yes   Assistance securing medications No   Educational health offerings Spiritual care   Encounter Details   Primary purpose of visit Spiritual Care/Support Visit   Was an Emergency Department visit averted? Not Applicable   Does patient have a medical provider? Yes   Patient referred to Not Applicable   Was a mental health screening completed? (GAINS tool) No   Does patient have dental issues? Yes   Was a dental referral made? No resources for a referral   Does patient have vision issues? No   Does your patient have an abnormal blood pressure today? No   Since previous encounter, have you referred patient for abnormal blood pressure that resulted in a new diagnosis or medication change? No   Does your patient have an abnormal blood glucose today? No   Since previous encounter, have you referred patient for abnormal blood glucose that resulted in a new diagnosis or medication change? No   Was there a life-saving intervention made? No    .Client will followup with me     Client graduated from steps to success today. .[name]

## 2016-07-29 ENCOUNTER — Other Ambulatory Visit: Payer: Self-pay | Admitting: Internal Medicine

## 2016-07-29 MED FILL — EAR DROPS 6.5%: 6.5 | 5 days supply | Qty: 15 | Fill #0

## 2016-08-07 ENCOUNTER — Ambulatory Visit: Payer: Self-pay

## 2016-08-07 ENCOUNTER — Encounter: Payer: Self-pay | Admitting: Internal Medicine

## 2016-08-07 ENCOUNTER — Ambulatory Visit: Payer: Self-pay | Attending: Internal Medicine | Admitting: Internal Medicine

## 2016-08-07 VITALS — BP 120/76 | HR 80 | Temp 98.8°F | Resp 18 | Ht 66.0 in | Wt 158.0 lb

## 2016-08-07 DIAGNOSIS — M545 Low back pain, unspecified: Secondary | ICD-10-CM

## 2016-08-07 DIAGNOSIS — F3112 Bipolar disorder, current episode manic without psychotic features, moderate: Secondary | ICD-10-CM

## 2016-08-07 DIAGNOSIS — Z880 Allergy status to penicillin: Secondary | ICD-10-CM | POA: Insufficient documentation

## 2016-08-07 DIAGNOSIS — Z79899 Other long term (current) drug therapy: Secondary | ICD-10-CM | POA: Insufficient documentation

## 2016-08-07 DIAGNOSIS — J302 Other seasonal allergic rhinitis: Secondary | ICD-10-CM | POA: Insufficient documentation

## 2016-08-07 DIAGNOSIS — F17200 Nicotine dependence, unspecified, uncomplicated: Secondary | ICD-10-CM

## 2016-08-07 DIAGNOSIS — F3189 Other bipolar disorder: Secondary | ICD-10-CM | POA: Insufficient documentation

## 2016-08-07 DIAGNOSIS — F17210 Nicotine dependence, cigarettes, uncomplicated: Secondary | ICD-10-CM | POA: Insufficient documentation

## 2016-08-07 DIAGNOSIS — J3089 Other allergic rhinitis: Secondary | ICD-10-CM

## 2016-08-07 LAB — POCT URINALYSIS DIPSTICK
Bilirubin, UA: NEGATIVE
Blood, UA: NEGATIVE
Glucose, UA: NEGATIVE
Ketones, UA: NEGATIVE
LEUKOCYTES UA: NEGATIVE
NITRITE UA: NEGATIVE
PH UA: 7 (ref 5.0–8.0)
PROTEIN UA: NEGATIVE
Spec Grav, UA: 1.02 (ref 1.010–1.025)
Urobilinogen, UA: 2 E.U./dL — AB

## 2016-08-07 MED ORDER — LORATADINE 10 MG PO TABS: 10.0000 mg | ORAL_TABLET | Freq: Every day | ORAL | 11 refills | Status: DC

## 2016-08-07 NOTE — Progress Notes (Signed)
Patient is here for med refill and allergies  Patient denies pain at this time.  Patient complains of seasonal allergies and taking Mucinex for the symptoms.  Patient has not taken medication today. Patient has eaten today.

## 2016-08-07 NOTE — Progress Notes (Signed)
Natalie Brown, is a 51 y.o. female  WUJ:[id number]  NFA:[id number]  DOB - Jan 20, 1966    Subjective:   Natalie Brown is a 51 y.o. female with history of tobacco dependence, urge incontinence, bipolar 1 disorder w/moderate mania here today for a follow up visit. [name] complaining of seasonal allergy, requesting medication. She has used OTC Mucinex with no significant improvement. [name] tangential with her narratives, difficult to complete a story without changing to another with significant flight of ideas and delusion of grandiosity. She denies any suicidal ideation or thought. Patient has No headache, No chest pain, No abdominal pain - No Nausea, No new weakness tingling or numbness, No Cough - SOB.  ALLERGIES: Allergies  Allergen Reactions  . Penicillins Itching   PAST MEDICAL HISTORY: No past medical history on file.  MEDICATIONS AT HOME: Prior to Admission medications   Medication Sig Start Date End Date Taking? Authorizing Provider  EAR DROPS EARWAX AID 6.5 % otic solution PLACE 5 DROPS INTO EACH EAR TWICE DAILY 07/29/16   Quentin Angst, MD  loratadine (CLARITIN) 10 MG tablet Take 1 tablet (10 mg total) by mouth daily. 08/07/16   Quentin Angst, MD  varenicline (CHANTIX STARTING MONTH PAK) 0.5 MG X 11 & 1 MG X 42 tablet Take one 0.5 mg tablet by mouth once daily for 3 days, then increase to one 0.5 mg tablet twice daily for 4 days, then increase to one 1 mg tablet twice daily. 06/29/15   Quentin Angst, MD    Objective:  There were no vitals filed for this visit. Exam General appearance : Awake, alert, not in any distress. Speech Clear. Not toxic looking HEENT: Atraumatic and Normocephalic, pupils equally reactive to light and accomodation Neck: Supple, no JVD. No cervical lymphadenopathy.  Chest: Good air entry bilaterally, no added sounds  CVS: S1 S2 regular, no murmurs.  Abdomen: Bowel sounds present, Non tender and not distended with no gaurding, rigidity  or rebound. Extremities: B/L Lower Ext shows no edema, both legs are warm to touch Neurology: Awake alert, and oriented X 3, CN II-XII intact, Non focal Skin: No Rash  Data Review Lab Results  Component Value Date   HGBA1C 5.6 12/30/2013    Assessment & Plan   1. Current smoker  Natalie Brown was counseled on the dangers of tobacco use, and was advised to quit. Reviewed strategies to maximize success, including removing cigarettes and smoking materials from environment, stress management and support of family/friends.   2. Bipolar 1 disorder with moderate mania (HCC)  - Ambulatory referral to Psychiatry  3. Seasonal allergic rhinitis due to other allergic trigger  - loratadine (CLARITIN) 10 MG tablet; Take 1 tablet (10 mg total) by mouth daily.  Dispense: 30 tablet; Refill: 11  Patient have been counseled extensively about nutrition and exercise. Other issues discussed during this visit include: low cholesterol diet, weight control and daily exercise   Return in about 1 year (around 08/07/2017).  The patient was given clear instructions to go to ER or return to medical center if symptoms don't improve, worsen or new problems develop. The patient verbalized understanding. The patient was told to call to get lab results if they haven't heard anything in the next week.   This note has been created with Education officer, environmental. Any transcriptional errors are unintentional.    Jeanann Lewandowsky, MD, MHA, FACP, FAAP, CPE [facility] and [facility] [place], [place] [phone]  08/07/2016, 3:03 PM

## 2016-08-07 NOTE — Patient Instructions (Signed)
Allergic Rhinitis Allergic rhinitis is when the mucous membranes in the nose respond to allergens. Allergens are particles in the air that cause your body to have an allergic reaction. This causes you to release allergic antibodies. Through a chain of events, these eventually cause you to release histamine into the blood stream. Although meant to protect the body, it is this release of histamine that causes your discomfort, such as frequent sneezing, congestion, and an itchy, runny nose. What are the causes? Seasonal allergic rhinitis (hay fever) is caused by pollen allergens that may come from grasses, trees, and weeds. Year-round allergic rhinitis (perennial allergic rhinitis) is caused by allergens such as house dust mites, pet dander, and mold spores. What are the signs or symptoms?  Nasal stuffiness (congestion).  Itchy, runny nose with sneezing and tearing of the eyes. How is this diagnosed? Your health care provider can help you determine the allergen or allergens that trigger your symptoms. If you and your health care provider are unable to determine the allergen, skin or blood testing may be used. Your health care provider will diagnose your condition after taking your health history and performing a physical exam. Your health care provider may assess you for other related conditions, such as asthma, pink eye, or an ear infection. How is this treated? Allergic rhinitis does not have a cure, but it can be controlled by:  Medicines that block allergy symptoms. These may include allergy shots, nasal sprays, and oral antihistamines.  Avoiding the allergen. Hay fever may often be treated with antihistamines in pill or nasal spray forms. Antihistamines block the effects of histamine. There are over-the-counter medicines that may help with nasal congestion and swelling around the eyes. Check with your health care provider before taking or giving this medicine. If avoiding the allergen or the  medicine prescribed do not work, there are many new medicines your health care provider can prescribe. Stronger medicine may be used if initial measures are ineffective. Desensitizing injections can be used if medicine and avoidance does not work. Desensitization is when a patient is given ongoing shots until the body becomes less sensitive to the allergen. Make sure you follow up with your health care provider if problems continue. Follow these instructions at home: It is not possible to completely avoid allergens, but you can reduce your symptoms by taking steps to limit your exposure to them. It helps to know exactly what you are allergic to so that you can avoid your specific triggers. Contact a health care provider if:  You have a fever.  You develop a cough that does not stop easily (persistent).  You have shortness of breath.  You start wheezing.  Symptoms interfere with normal daily activities. This information is not intended to replace advice given to you by your health care provider. Make sure you discuss any questions you have with your health care provider. Document Released: 01/01/2001 Document Revised: 12/08/2015 Document Reviewed: 12/14/2012 Elsevier Interactive Patient Education  2017 Elsevier Inc.  

## 2016-08-23 ENCOUNTER — Ambulatory Visit (HOSPITAL_COMMUNITY)
Admission: EM | Admit: 2016-08-23 | Discharge: 2016-08-23 | Disposition: A | Discharge status: Home / Self Care | Payer: Self-pay | Attending: Internal Medicine | Admitting: Internal Medicine

## 2016-08-23 ENCOUNTER — Encounter (HOSPITAL_COMMUNITY): Payer: Self-pay | Admitting: Emergency Medicine

## 2016-08-23 DIAGNOSIS — R002 Palpitations: Secondary | ICD-10-CM

## 2016-08-23 DIAGNOSIS — Z9109 Other allergy status, other than to drugs and biological substances: Secondary | ICD-10-CM

## 2016-08-23 DIAGNOSIS — R0981 Nasal congestion: Secondary | ICD-10-CM

## 2016-08-23 MED ORDER — PSEUDOEPHEDRINE-GUAIFENESIN ER 60-600 MG PO TB12: 1.0000 | ORAL_TABLET | Freq: Two times a day (BID) | ORAL | 0 refills | Status: DC

## 2016-08-23 MED ORDER — PREDNISONE 50 MG PO TABS: 50.0000 mg | ORAL_TABLET | Freq: Every day | ORAL | 0 refills | Status: AC

## 2016-08-23 MED ORDER — TRIAMCINOLONE ACETONIDE 55 MCG/ACT NA AERO: 2.0000 | INHALATION_SPRAY | Freq: Every day | NASAL | 0 refills | Status: DC

## 2016-08-23 NOTE — ED Provider Notes (Signed)
[facility]    CSN: [id number] Arrival date & time: 08/23/16  1520     History   Chief Complaint Chief Complaint  Patient presents with  . Facial Pain    HPI Natalie Brown is a 51 y.o. female. She presents today with sinus congestion and drainage, itchy eyes, tearing, for the last month or so. She has been taking loratadine, with Mucinex-D. She has a little difficulty with palpitations, particularly at night. Heart rate is 111. Also occasional urinary incontinence, worse since the allergy season started. Taking loratadine 20-30 mg sometimes more than once a day. Taking the Mucinex D twice a day.    HPI  History reviewed. No pertinent past medical history.  Patient Active Problem List   Diagnosis Date Noted  . Dental caries 06/29/2015  . Cervical cancer screening 02/22/2014  . Current smoker 02/22/2014  . Urge incontinence of urine 12/30/2013  . Need for prophylactic vaccination and inoculation against influenza 12/30/2013  . Bipolar 1 disorder with moderate mania (HCC) 12/30/2013    History reviewed. No pertinent surgical history.   Home Medications    Prior to Admission medications   Medication Sig Start Date End Date Taking? Authorizing Provider  loratadine (CLARITIN) 10 MG tablet Take 1 tablet (10 mg total) by mouth daily. 08/07/16  Yes Quentin Angst, MD  predniSONE (DELTASONE) 50 MG tablet Take 1 tablet (50 mg total) by mouth daily. 08/23/16 08/26/16  Eustace Moore, MD  pseudoephedrine-guaifenesin (MUCINEX D) 60-600 MG 12 hr tablet Take 1 tablet by mouth every 12 (twelve) hours. 08/23/16   Eustace Moore, MD  triamcinolone (NASACORT AQ) 55 MCG/ACT AERO nasal inhaler Place 2 sprays into the nose daily. 08/23/16   Eustace Moore, MD    Family History History reviewed. No pertinent family history.  Social History Social History  Substance Use Topics  . Smoking status: Current Every Day Smoker  . Smokeless tobacco: Current User  . Alcohol use No       Allergies   Penicillins   Review of Systems Review of Systems  All other systems reviewed and are negative.    Physical Exam Triage Vital Signs ED Triage Vitals  Enc Vitals Group     BP 08/23/16 1553 122/77     Pulse Rate 08/23/16 1553 (!) 111     Resp 08/23/16 1553 16     Temp 08/23/16 1553 98.3 F (36.8 C)     Temp Source 08/23/16 1553 Oral     SpO2 08/23/16 1553 100 %     Weight --      Height --      Pain Score 08/23/16 1552 4     Pain Loc --    Updated Vital Signs BP 122/77 (BP Location: Right Arm)   Pulse (!) 111   Temp 98.3 F (36.8 C) (Oral)   Resp 16   SpO2 100%   Physical Exam  Constitutional: [name] oriented to person, place, and time. No distress.  HENT:  Head: Atraumatic.  Eyes:  Conjugate gaze observed, no eye redness/discharge  Neck: Neck supple.  Cardiovascular: Regular rhythm.   HR 110s  Pulmonary/Chest: No respiratory distress. She has no wheezes. She has no rales.  Lungs clear, symmetric breath sounds  Abdominal: She exhibits no distension.  Musculoskeletal: Normal range of motion. She exhibits no edema.  Neurological: [name] alert and oriented to person, place, and time.  Skin: Skin is warm and dry.  Nursing note and vitals reviewed.  UC Treatments / Results   Procedures Procedures (including critical care time) None today  Final Clinical Impressions(s) / UC Diagnoses   Final diagnoses:  Sinus congestion  Environmental allergies  Intermittent palpitations   Sudafed (pseudoephedrine, the "D" in the allergy medicine) can cause palpitations (heart beat skipping).  Will try prednisone (anti inflammatory) and nasal steroid spray (for sinus congestion, eye itching).  Prescription for mucinex D sent also, use sparingly.  Continue work on quitting smoking.  Recheck for new fever >100.5, increasing phlegm production/nasal discharge, or if not starting to improve in a few days.   New Prescriptions New Prescriptions   PREDNISONE  (DELTASONE) 50 MG TABLET    Take 1 tablet (50 mg total) by mouth daily.   TRIAMCINOLONE (NASACORT AQ) 55 MCG/ACT AERO NASAL INHALER    Place 2 sprays into the nose daily.     Eustace MooreMurray, Ellison Leisure W, MD 08/25/16 [phone]

## 2016-08-23 NOTE — Discharge Instructions (Addendum)
Sudafed (pseudoephedrine, the "D" in the allergy medicine) can cause palpitations (heart beat skipping).  Will try prednisone (anti inflammatory) and nasal steroid spray (for sinus congestion, eye itching).  Prescription for mucinex D sent also, use sparingly.  Continue work on quitting smoking.  Recheck for new fever >100.5, increasing phlegm production/nasal discharge, or if not starting to improve in a few days.

## 2016-08-23 NOTE — ED Triage Notes (Signed)
[facility] a [facility] pressure.

## 2016-08-23 NOTE — ED Notes (Signed)
Bed: UC07 Expected date:  Expected time:  Means of arrival:  Comments: Closed 

## 2017-09-03 ENCOUNTER — Encounter: Payer: Self-pay | Admitting: Family Medicine

## 2017-09-03 ENCOUNTER — Ambulatory Visit: Payer: Self-pay | Attending: Family Medicine | Admitting: Family Medicine

## 2017-09-03 DIAGNOSIS — J302 Other seasonal allergic rhinitis: Secondary | ICD-10-CM | POA: Insufficient documentation

## 2017-09-03 DIAGNOSIS — F319 Bipolar disorder, unspecified: Secondary | ICD-10-CM | POA: Insufficient documentation

## 2017-09-03 DIAGNOSIS — Z880 Allergy status to penicillin: Secondary | ICD-10-CM | POA: Insufficient documentation

## 2017-09-03 DIAGNOSIS — J3089 Other allergic rhinitis: Secondary | ICD-10-CM

## 2017-09-03 MED ORDER — CARBAMIDE PEROXIDE 6.5 % OT SOLN: 5.0000 [drp] | Freq: Two times a day (BID) | OTIC | 1 refills | Status: DC

## 2017-09-03 MED ORDER — PSEUDOEPHEDRINE-GUAIFENESIN ER 60-600 MG PO TB12: 1.0000 | ORAL_TABLET | Freq: Two times a day (BID) | ORAL | 0 refills | Status: DC

## 2017-09-03 MED ORDER — LORATADINE 10 MG PO TABS: 10.0000 mg | ORAL_TABLET | Freq: Every day | ORAL | 11 refills | Status: DC

## 2017-09-03 NOTE — Patient Instructions (Signed)
Allergies An allergy is when your body reacts to a substance in a way that is not normal. An allergic reaction can happen after you:  Eat something.  Breathe in something.  Touch something.  You can be allergic to:  Things that are only around during certain seasons, like molds and pollens.  Foods.  Drugs.  Insects.  Animal dander.  What are the signs or symptoms?  Puffiness (swelling). This may happen on the lips, face, tongue, mouth, or throat.  Sneezing.  Coughing.  Breathing loudly (wheezing).  Stuffy nose.  Tingling in the mouth.  A rash.  Itching.  Itchy, red, puffy areas of skin (hives).  Watery eyes.  Throwing up (vomiting).  Watery poop (diarrhea).  Dizziness.  Feeling faint or fainting.  Trouble breathing or swallowing.  A tight feeling in the chest.  A fast heartbeat. How is this diagnosed? Allergies can be diagnosed with:  A medical and family history.  Skin tests.  Blood tests.  A food diary. A food diary is a record of all the foods, drinks, and symptoms you have each day.  The results of an elimination diet. This diet involves making sure not to eat certain foods and then seeing what happens when you start eating them again.  How is this treated? There is no cure for allergies, but allergic reactions can be treated with medicine. Severe reactions usually need to be treated at a hospital. How is this prevented? The best way to prevent an allergic reaction is to avoid the thing you are allergic to. Allergy shots and medicines can also help prevent reactions in some cases. This information is not intended to replace advice given to you by your health care provider. Make sure you discuss any questions you have with your health care provider. Document Released: 08/03/2012 Document Revised: 12/04/2015 Document Reviewed: 01/18/2014 Elsevier Interactive Patient Education  2018 Elsevier Inc.  

## 2017-09-03 NOTE — Progress Notes (Signed)
Subjective:  Patient ID: Natalie Brown, female    DOB: 03-31-1966  Age: 52 y.o. MRN: [id number]  CC: Establish Care   HPI Natalie Brown is a 52 year old female with a history of seasonal allergies, Bipolar disorder here to establish care with me. She was last seen in the clinic 1 year ago and needs refills of her antihistamines and nasal spray. Her allergies bother her all year round with associated sneezing, runny nose and itchy eyes. She exercises regularly and is a vegan which she states has helped her loose weight. She currently does not take medications for bipolar disorder. She has no acute concerns today.  Past Medical History:  Diagnosis Date  . Bipolar 1 disorder with moderate mania (HCC) 12/30/2013     History reviewed. No pertinent surgical history.  Allergies  Allergen Reactions  . Penicillins Itching      Outpatient Medications Prior to Visit  Medication Sig Dispense Refill  . triamcinolone (NASACORT AQ) 55 MCG/ACT AERO nasal inhaler Place 2 sprays into the nose daily. 1 Inhaler 0  . loratadine (CLARITIN) 10 MG tablet Take 1 tablet (10 mg total) by mouth daily. 30 tablet 11  . pseudoephedrine-guaifenesin (MUCINEX D) 60-600 MG 12 hr tablet Take 1 tablet by mouth every 12 (twelve) hours. 30 tablet 0   No facility-administered medications prior to visit.     ROS Review of Systems  Constitutional: Negative for activity change, appetite change and fatigue.  HENT: Positive for rhinorrhea and sneezing. Negative for congestion, sinus pressure and sore throat.   Eyes: Positive for itching. Negative for visual disturbance.  Respiratory: Negative for cough, chest tightness, shortness of breath and wheezing.   Cardiovascular: Negative for chest pain and palpitations.  Gastrointestinal: Negative for abdominal distention, abdominal pain and constipation.  Endocrine: Negative for polydipsia.  Genitourinary: Negative for dysuria and frequency.  Musculoskeletal:  Negative for arthralgias and back pain.  Skin: Negative for rash.  Neurological: Negative for tremors, light-headedness and numbness.  Hematological: Does not bruise/bleed easily.  Psychiatric/Behavioral: Negative for agitation and behavioral problems.    Objective:  BP 125/77   Pulse (!) 101   Temp 97.6 F (36.4 C) (Oral)   Wt 163 lb 12.8 oz (74.3 kg)   SpO2 96%   BMI 26.44 kg/m   BP/Weight 09/03/2017 08/23/2016 08/07/2016  Systolic BP 125 122 120  Diastolic BP 77 77 76  Wt. (Lbs) 163.8 - 158  BMI 26.44 - 25.5      Physical Exam  Constitutional: [name] oriented to person, place, and time. She appears well-developed and well-nourished.  HENT:  Right Ear: External ear normal.  Left Ear: External ear normal.  Mouth/Throat: Oropharynx is clear and moist.  Cardiovascular: Normal rate, normal heart sounds and intact distal pulses.  No murmur heard. Pulmonary/Chest: Effort normal and breath sounds normal. She has no wheezes. She has no rales. She exhibits no tenderness.  Abdominal: Soft. Bowel sounds are normal. She exhibits no distension and no mass. There is no tenderness.  Musculoskeletal: Normal range of motion.  Neurological: [name] alert and oriented to person, place, and time.     Assessment & Plan:   1. Seasonal allergic rhinitis due to other allergic trigger Uncontrolled due to running out of medications which I have refilled - loratadine (CLARITIN) 10 MG tablet; Take 1 tablet (10 mg total) by mouth daily.  Dispense: 30 tablet; Refill: 11 - pseudoephedrine-guaifenesin (MUCINEX D) 60-600 MG 12 hr tablet; Take 1 tablet by mouth  every 12 (twelve) hours.  Dispense: 30 tablet; Refill: 0 - carbamide peroxide (DEBROX) 6.5 % OTIC solution; Place 5 drops into both ears 2 (two) times daily.  Dispense: 15 mL; Refill: 1   Meds ordered this encounter  Medications  . loratadine (CLARITIN) 10 MG tablet    Sig: Take 1 tablet (10 mg total) by mouth daily.    Dispense:  30 tablet      Refill:  11  . pseudoephedrine-guaifenesin (MUCINEX D) 60-600 MG 12 hr tablet    Sig: Take 1 tablet by mouth every 12 (twelve) hours.    Dispense:  30 tablet    Refill:  0  . carbamide peroxide (DEBROX) 6.5 % OTIC solution    Sig: Place 5 drops into both ears 2 (two) times daily.    Dispense:  15 mL    Refill:  1    Follow-up: Return in about 1 month (around 10/04/2017) for complete physical exam.   Hoy Register MD

## 2017-09-19 ENCOUNTER — Encounter: Payer: Self-pay | Admitting: Family Medicine

## 2017-09-19 ENCOUNTER — Ambulatory Visit: Payer: Self-pay | Attending: Family Medicine | Admitting: Family Medicine

## 2017-09-19 ENCOUNTER — Other Ambulatory Visit: Payer: Self-pay

## 2017-09-19 VITALS — BP 114/76 | HR 90 | Temp 97.9°F | Wt 164.2 lb

## 2017-09-19 DIAGNOSIS — Z124 Encounter for screening for malignant neoplasm of cervix: Secondary | ICD-10-CM

## 2017-09-19 DIAGNOSIS — Z79899 Other long term (current) drug therapy: Secondary | ICD-10-CM | POA: Insufficient documentation

## 2017-09-19 DIAGNOSIS — Z1231 Encounter for screening mammogram for malignant neoplasm of breast: Secondary | ICD-10-CM

## 2017-09-19 DIAGNOSIS — Z0000 Encounter for general adult medical examination without abnormal findings: Secondary | ICD-10-CM

## 2017-09-19 DIAGNOSIS — Z880 Allergy status to penicillin: Secondary | ICD-10-CM | POA: Insufficient documentation

## 2017-09-19 DIAGNOSIS — Z1211 Encounter for screening for malignant neoplasm of colon: Secondary | ICD-10-CM

## 2017-09-19 DIAGNOSIS — Z01419 Encounter for gynecological examination (general) (routine) without abnormal findings: Secondary | ICD-10-CM | POA: Insufficient documentation

## 2017-09-19 DIAGNOSIS — Z1239 Encounter for other screening for malignant neoplasm of breast: Secondary | ICD-10-CM

## 2017-09-19 DIAGNOSIS — Z8659 Personal history of other mental and behavioral disorders: Secondary | ICD-10-CM | POA: Insufficient documentation

## 2017-09-19 MED ORDER — TETANUS-DIPHTH-ACELL PERTUSSIS 5-2.5-18.5 LF-MCG/0.5 IM SUSP: 0.5000 mL | INTRAMUSCULAR | 0 refills | Status: DC

## 2017-09-19 NOTE — Patient Instructions (Signed)
Health Maintenance, Female Adopting a healthy lifestyle and getting preventive care can go a long way to promote health and wellness. Talk with your health care provider about what schedule of regular examinations is right for you. This is a good chance for you to check in with your provider about disease prevention and staying healthy. In between checkups, there are plenty of things you can do on your own. Experts have done a lot of research about which lifestyle changes and preventive measures are most likely to keep you healthy. Ask your health care provider for more information. Weight and diet Eat a healthy diet  Be sure to include plenty of vegetables, fruits, low-fat dairy products, and lean protein.  Do not eat a lot of foods high in solid fats, added sugars, or salt.  Get regular exercise. This is one of the most important things you can do for your health. ? Most adults should exercise for at least 150 minutes each week. The exercise should increase your heart rate and make you sweat (moderate-intensity exercise). ? Most adults should also do strengthening exercises at least twice a week. This is in addition to the moderate-intensity exercise.  Maintain a healthy weight  Body mass index (BMI) is a measurement that can be used to identify possible weight problems. It estimates body fat based on height and weight. Your health care provider can help determine your BMI and help you achieve or maintain a healthy weight.  For females 20 years of age and older: ? A BMI below 18.5 is considered underweight. ? A BMI of 18.5 to 24.9 is normal. ? A BMI of 25 to 29.9 is considered overweight. ? A BMI of 30 and above is considered obese.  Watch levels of cholesterol and blood lipids  You should start having your blood tested for lipids and cholesterol at 50 years of age, then have this test every 5 years.  You may need to have your cholesterol levels checked more often if: ? Your lipid or  cholesterol levels are high. ? You are older than 50 years of age. ? You are at high risk for heart disease.  Cancer screening Lung Cancer  Lung cancer screening is recommended for adults 55-80 years old who are at high risk for lung cancer because of a history of smoking.  A yearly low-dose CT scan of the lungs is recommended for people who: ? Currently smoke. ? Have quit within the past 15 years. ? Have at least a 30-pack-year history of smoking. A pack year is smoking an average of one pack of cigarettes a day for 1 year.  Yearly screening should continue until it has been 15 years since you quit.  Yearly screening should stop if you develop a health problem that would prevent you from having lung cancer treatment.  Breast Cancer  Practice breast self-awareness. This means understanding how your breasts normally appear and feel.  It also means doing regular breast self-exams. Let your health care provider know about any changes, no matter how small.  If you are in your 20s or 30s, you should have a clinical breast exam (CBE) by a health care provider every 1-3 years as part of a regular health exam.  If you are 40 or older, have a CBE every year. Also consider having a breast X-ray (mammogram) every year.  If you have a family history of breast cancer, talk to your health care provider about genetic screening.  If you are at high risk   for breast cancer, talk to your health care provider about having an MRI and a mammogram every year.  Breast cancer gene (BRCA) assessment is recommended for women who have family members with BRCA-related cancers. BRCA-related cancers include: ? Breast. ? Ovarian. ? Tubal. ? Peritoneal cancers.  Results of the assessment will determine the need for genetic counseling and BRCA1 and BRCA2 testing.  Cervical Cancer Your health care provider may recommend that you be screened regularly for cancer of the pelvic organs (ovaries, uterus, and  vagina). This screening involves a pelvic examination, including checking for microscopic changes to the surface of your cervix (Pap test). You may be encouraged to have this screening done every 3 years, beginning at age 22.  For women ages 56-65, health care providers may recommend pelvic exams and Pap testing every 3 years, or they may recommend the Pap and pelvic exam, combined with testing for human papilloma virus (HPV), every 5 years. Some types of HPV increase your risk of cervical cancer. Testing for HPV may also be done on women of any age with unclear Pap test results.  Other health care providers may not recommend any screening for nonpregnant women who are considered low risk for pelvic cancer and who do not have symptoms. Ask your health care provider if a screening pelvic exam is right for you.  If you have had past treatment for cervical cancer or a condition that could lead to cancer, you need Pap tests and screening for cancer for at least 20 years after your treatment. If Pap tests have been discontinued, your risk factors (such as having a new sexual partner) need to be reassessed to determine if screening should resume. Some women have medical problems that increase the chance of getting cervical cancer. In these cases, your health care provider may recommend more frequent screening and Pap tests.  Colorectal Cancer  This type of cancer can be detected and often prevented.  Routine colorectal cancer screening usually begins at 50 years of age and continues through 50 years of age.  Your health care provider may recommend screening at an earlier age if you have risk factors for colon cancer.  Your health care provider may also recommend using home test kits to check for hidden blood in the stool.  A small camera at the end of a tube can be used to examine your colon directly (sigmoidoscopy or colonoscopy). This is done to check for the earliest forms of colorectal  cancer.  Routine screening usually begins at age 33.  Direct examination of the colon should be repeated every 5-10 years through 50 years of age. However, you may need to be screened more often if early forms of precancerous polyps or small growths are found.  Skin Cancer  Check your skin from head to toe regularly.  Tell your health care provider about any new moles or changes in moles, especially if there is a change in a mole's shape or color.  Also tell your health care provider if you have a mole that is larger than the size of a pencil eraser.  Always use sunscreen. Apply sunscreen liberally and repeatedly throughout the day.  Protect yourself by wearing long sleeves, pants, a wide-brimmed hat, and sunglasses whenever you are outside.  Heart disease, diabetes, and high blood pressure  High blood pressure causes heart disease and increases the risk of stroke. High blood pressure is more likely to develop in: ? People who have blood pressure in the high end of  the normal range (130-139/85-89 mm Hg). ? People who are overweight or obese. ? People who are African American.  If you are 21-29 years of age, have your blood pressure checked every 3-5 years. If you are 3 years of age or older, have your blood pressure checked every year. You should have your blood pressure measured twice-once when you are at a hospital or clinic, and once when you are not at a hospital or clinic. Record the average of the two measurements. To check your blood pressure when you are not at a hospital or clinic, you can use: ? An automated blood pressure machine at a pharmacy. ? A home blood pressure monitor.  If you are between 17 years and 37 years old, ask your health care provider if you should take aspirin to prevent strokes.  Have regular diabetes screenings. This involves taking a blood sample to check your fasting blood sugar level. ? If you are at a normal weight and have a low risk for diabetes,  have this test once every three years after 50 years of age. ? If you are overweight and have a high risk for diabetes, consider being tested at a younger age or more often. Preventing infection Hepatitis B  If you have a higher risk for hepatitis B, you should be screened for this virus. You are considered at high risk for hepatitis B if: ? You were born in a country where hepatitis B is common. Ask your health care provider which countries are considered high risk. ? Your parents were born in a high-risk country, and you have not been immunized against hepatitis B (hepatitis B vaccine). ? You have HIV or AIDS. ? You use needles to inject street drugs. ? You live with someone who has hepatitis B. ? You have had sex with someone who has hepatitis B. ? You get hemodialysis treatment. ? You take certain medicines for conditions, including cancer, organ transplantation, and autoimmune conditions.  Hepatitis C  Blood testing is recommended for: ? Everyone born from [birth year] through 1963. ? Anyone with known risk factors for hepatitis C.  Sexually transmitted infections (STIs)  You should be screened for sexually transmitted infections (STIs) including gonorrhea and chlamydia if: ? You are sexually active and are younger than 50 years of age. ? You are older than 50 years of age and your health care provider tells you that you are at risk for this type of infection. ? Your sexual activity has changed since you were last screened and you are at an increased risk for chlamydia or gonorrhea. Ask your health care provider if you are at risk.  If you do not have HIV, but are at risk, it may be recommended that you take a prescription medicine daily to prevent HIV infection. This is called pre-exposure prophylaxis (PrEP). You are considered at risk if: ? You are sexually active and do not regularly use condoms or know the HIV status of your partner(s). ? You take drugs by injection. ? You are  sexually active with a partner who has HIV.  Talk with your health care provider about whether you are at high risk of being infected with HIV. If you choose to begin PrEP, you should first be tested for HIV. You should then be tested every 3 months for as long as you are taking PrEP. Pregnancy  If you are premenopausal and you may become pregnant, ask your health care provider about preconception counseling.  If you may become  pregnant, take 400 to 800 micrograms (mcg) of folic acid every day.  If you want to prevent pregnancy, talk to your health care provider about birth control (contraception). Osteoporosis and menopause  Osteoporosis is a disease in which the bones lose minerals and strength with aging. This can result in serious bone fractures. Your risk for osteoporosis can be identified using a bone density scan.  If you are 13 years of age or older, or if you are at risk for osteoporosis and fractures, ask your health care provider if you should be screened.  Ask your health care provider whether you should take a calcium or vitamin D supplement to lower your risk for osteoporosis.  Menopause may have certain physical symptoms and risks.  Hormone replacement therapy may reduce some of these symptoms and risks. Talk to your health care provider about whether hormone replacement therapy is right for you. Follow these instructions at home:  Schedule regular health, dental, and eye exams.  Stay current with your immunizations.  Do not use any tobacco products including cigarettes, chewing tobacco, or electronic cigarettes.  If you are pregnant, do not drink alcohol.  If you are breastfeeding, limit how much and how often you drink alcohol.  Limit alcohol intake to no more than 1 drink per day for nonpregnant women. One drink equals 12 ounces of beer, 5 ounces of wine, or 1 ounces of hard liquor.  Do not use street drugs.  Do not share needles.  Ask your health care  provider for help if you need support or information about quitting drugs.  Tell your health care provider if you often feel depressed.  Tell your health care provider if you have ever been abused or do not feel safe at home. This information is not intended to replace advice given to you by your health care provider. Make sure you discuss any questions you have with your health care provider. Document Released: 10/22/2010 Document Revised: 09/14/2015 Document Reviewed: 01/10/2015 Elsevier Interactive Patient Education  [employer].

## 2017-09-19 NOTE — Progress Notes (Signed)
Subjective:  Patient ID: Natalie Brown, female    DOB: [birth year]  Age: 52 y.o. MRN: [id number]  CC: Annual Exam and Gynecologic Exam   HPI Natalie Brown presents for a complete physical exam.  Last Pap smear was in 02/2014.  She has never had a mammogram or colonoscopy. She has no acute concerns today.  Past Medical History:  Diagnosis Date  . Bipolar 1 disorder with moderate mania ([facility]) 12/30/2013    No past surgical history on file.  Allergies  Allergen Reactions  . Penicillins Itching      Outpatient Medications Prior to Visit  Medication Sig Dispense Refill  . loratadine (CLARITIN) 10 MG tablet Take 1 tablet (10 mg total) by mouth daily. 30 tablet 11  . triamcinolone (NASACORT AQ) 55 MCG/ACT AERO nasal inhaler Place 2 sprays into the nose daily. 1 Inhaler 0  . carbamide peroxide (DEBROX) 6.5 % OTIC solution Place 5 drops into both ears 2 (two) times daily. (Patient not taking: Reported on 09/19/2017) 15 mL 1  . pseudoephedrine-guaifenesin (MUCINEX D) 60-600 MG 12 hr tablet Take 1 tablet by mouth every 12 (twelve) hours. (Patient not taking: Reported on 09/19/2017) 30 tablet 0   No facility-administered medications prior to visit.     ROS Review of Systems  Constitutional: Negative for activity change, appetite change and fatigue.  HENT: Negative for congestion, sinus pressure and sore throat.   Eyes: Negative for visual disturbance.  Respiratory: Negative for cough, chest tightness, shortness of breath and wheezing.   Cardiovascular: Negative for chest pain and palpitations.  Gastrointestinal: Negative for abdominal distention, abdominal pain and constipation.  Endocrine: Negative for polydipsia.  Genitourinary: Negative for dysuria and frequency.  Musculoskeletal: Negative for arthralgias and back pain.  Skin: Negative for rash.  Neurological: Negative for tremors, light-headedness and numbness.  Hematological: Does not bruise/bleed easily.    Psychiatric/Behavioral: Negative for agitation and behavioral problems.    Objective:  BP 114/76   Pulse 90   Temp 97.9 F (36.6 C) (Oral)   Wt 164 lb 3.2 oz (74.5 kg)   SpO2 96%   BMI 26.50 kg/m   BP/Weight 09/19/2017 5/95/6387 08/26/4330  Systolic BP 951 884 166  Diastolic BP 76 77 77  Wt. (Lbs) 164.2 163.8 -  BMI 26.5 26.44 -      Physical Exam  Constitutional: [name] oriented to person, place, and time. She appears well-developed and well-nourished. No distress.  HENT:  Head: Normocephalic.  Right Ear: External ear normal.  Left Ear: External ear normal.  Nose: Nose normal.  Mouth/Throat: Oropharynx is clear and moist.  Eyes: Pupils are equal, round, and reactive to light. Conjunctivae and EOM are normal.  Neck: Normal range of motion. No JVD present.  Cardiovascular: Normal rate, regular rhythm, normal heart sounds and intact distal pulses. Exam reveals no gallop.  No murmur heard. Pulmonary/Chest: Effort normal and breath sounds normal. No respiratory distress. She has no wheezes. She has no rales. She exhibits no tenderness. Right breast exhibits no mass and no tenderness. Left breast exhibits no mass and no tenderness.  Abdominal: Soft. Bowel sounds are normal. She exhibits no distension and no mass. There is no tenderness.  Genitourinary:  Genitourinary Comments: External genitalia, vagina, cervix, adnexa-all normal  Musculoskeletal: Normal range of motion. She exhibits no edema or tenderness.  Neurological: [name] alert and oriented to person, place, and time. She has normal reflexes.  Skin: Skin is warm and dry. [name] not diaphoretic.  Psychiatric:  She has a normal mood and affect.     Assessment & Plan:   1. Annual physical exam Counseled on 150 minutes of exercise per week, healthy eating (including decreased daily intake of saturated fats, cholesterol, added sugars, sodium), STI prevention, routine healthcare maintenance. - Lipid panel - CMP14+EGFR -  HIV antibody  2. Screening for cervical cancer - Cytology - PAP([place])  3. Screening for breast cancer - MM Digital Screening; Future  4. Screening for colon cancer - Ambulatory referral to Gastroenterology   No orders of the defined types were placed in this encounter.   Follow-up: Return in about 1 year (around 09/20/2018) for complete physical exam.   Charlott Rakes MD

## 2017-09-20 LAB — CMP14+EGFR
ALBUMIN: 4.1 g/dL (ref 3.5–5.5)
ALK PHOS: 69 IU/L (ref 39–117)
ALT: 14 IU/L (ref 0–32)
AST: 24 IU/L (ref 0–40)
Albumin/Globulin Ratio: 1.5 (ref 1.2–2.2)
BILIRUBIN TOTAL: 0.3 mg/dL (ref 0.0–1.2)
BUN/Creatinine Ratio: 10 (ref 9–23)
BUN: 11 mg/dL (ref 6–24)
CO2: 25 mmol/L (ref 20–29)
CREATININE: 1.14 mg/dL — AB (ref 0.57–1.00)
Calcium: 9.6 mg/dL (ref 8.7–10.2)
Chloride: 104 mmol/L (ref 96–106)
GFR calc non Af Amer: 56 mL/min/{1.73_m2} — ABNORMAL LOW (ref >59)
GFR, EST AFRICAN AMERICAN: 65 mL/min/{1.73_m2} (ref >59)
GLOBULIN, TOTAL: 2.8 g/dL (ref 1.5–4.5)
Glucose: 82 mg/dL (ref 65–99)
Potassium: 4.8 mmol/L (ref 3.5–5.2)
SODIUM: 144 mmol/L (ref 134–144)
TOTAL PROTEIN: 6.9 g/dL (ref 6.0–8.5)

## 2017-09-20 LAB — LIPID PANEL
Chol/HDL Ratio: 3.9 ratio (ref 0.0–4.4)
Cholesterol, Total: 188 mg/dL (ref 100–199)
HDL: 48 mg/dL (ref >39)
LDL Calculated: 118 mg/dL — ABNORMAL HIGH (ref 0–99)
Triglycerides: 112 mg/dL (ref 0–149)
VLDL CHOLESTEROL CAL: 22 mg/dL (ref 5–40)

## 2017-09-20 LAB — HIV ANTIBODY (ROUTINE TESTING W REFLEX): HIV SCREEN 4TH GENERATION: NONREACTIVE

## 2017-09-24 ENCOUNTER — Telehealth: Payer: Self-pay

## 2017-09-24 LAB — CYTOLOGY - PAP
Candida vaginitis: NEGATIVE
Chlamydia: NEGATIVE
Diagnosis: NEGATIVE
HPV: NOT DETECTED
NEISSERIA GONORRHEA: NEGATIVE
Trichomonas: NEGATIVE

## 2017-09-24 NOTE — Telephone Encounter (Signed)
Patient was called and voicemail was full.   If patient returns phone call please inform patient that cholesterol is slightly elevated. Low cholesterol diet will help with this.

## 2017-09-25 LAB — CERVICOVAGINAL ANCILLARY ONLY: Herpes: NEGATIVE

## 2017-09-26 MED FILL — $BOOSTRIX VACCINE SYRINGE: 5-2.5-18.5 | 1 days supply | Qty: 1 | Fill #0

## 2017-10-27 ENCOUNTER — Ambulatory Visit: Payer: Self-pay | Attending: Family Medicine

## 2017-12-12 ENCOUNTER — Ambulatory Visit: Payer: Self-pay | Attending: Family Medicine

## 2018-09-10 ENCOUNTER — Encounter: Payer: Self-pay | Admitting: Primary Care

## 2018-09-10 ENCOUNTER — Ambulatory Visit: Payer: Self-pay | Attending: Primary Care | Admitting: Primary Care

## 2018-09-10 ENCOUNTER — Other Ambulatory Visit: Payer: Self-pay

## 2018-09-10 VITALS — BP 117/78 | HR 101 | Temp 98.2°F | Wt 160.6 lb

## 2018-09-10 DIAGNOSIS — N3941 Urge incontinence: Secondary | ICD-10-CM

## 2018-09-10 DIAGNOSIS — F17210 Nicotine dependence, cigarettes, uncomplicated: Secondary | ICD-10-CM

## 2018-09-10 DIAGNOSIS — S80862A Insect bite (nonvenomous), left lower leg, initial encounter: Secondary | ICD-10-CM

## 2018-09-10 DIAGNOSIS — S80861A Insect bite (nonvenomous), right lower leg, initial encounter: Secondary | ICD-10-CM

## 2018-09-10 DIAGNOSIS — W57XXXA Bitten or stung by nonvenomous insect and other nonvenomous arthropods, initial encounter: Secondary | ICD-10-CM

## 2018-09-10 DIAGNOSIS — J3089 Other allergic rhinitis: Secondary | ICD-10-CM

## 2018-09-10 DIAGNOSIS — S0006XA Insect bite (nonvenomous) of scalp, initial encounter: Secondary | ICD-10-CM

## 2018-09-10 DIAGNOSIS — J302 Other seasonal allergic rhinitis: Secondary | ICD-10-CM

## 2018-09-10 DIAGNOSIS — Z590 Homelessness: Secondary | ICD-10-CM

## 2018-09-10 MED ORDER — MUPIROCIN CALCIUM 2 % EX CREA: 1.0000 "application " | TOPICAL_CREAM | Freq: Two times a day (BID) | CUTANEOUS | 0 refills | Status: DC

## 2018-09-10 MED ORDER — DOXYCYCLINE HYCLATE 100 MG PO TABS: 100.0000 mg | ORAL_TABLET | Freq: Two times a day (BID) | ORAL | 0 refills | Status: DC

## 2018-09-10 MED ORDER — MUPIROCIN 2 % EX OINT: 1.0000 "application " | TOPICAL_OINTMENT | Freq: Two times a day (BID) | CUTANEOUS | 0 refills | Status: DC

## 2018-09-10 MED ORDER — LORATADINE 10 MG PO TABS: 10.0000 mg | ORAL_TABLET | Freq: Every day | ORAL | 11 refills | Status: DC

## 2018-09-10 MED FILL — DOXYCYCLINE HYCLATE 100 MG: 100 | 10 days supply | Qty: 20 | Fill #0

## 2018-09-10 MED FILL — MUPIROCIN 2% OINTMENT: 2 | 10 days supply | Qty: 22 | Fill #0

## 2018-09-10 NOTE — Progress Notes (Signed)
Acute Office Visit  Subjective:    Patient ID: Natalie Brown, female    DOB: 1965-12-13, 53 y.o.   MRN: [id number]  Chief Complaint  Patient presents with  . Insect Bite    HPI Natalie Brown is in today for an acute visit same day visit she is concern that something is biting her. Patient first thought was a spider bite but does not present that way. She has multiple raised darken areas . [name] also homeless and been placed in different hotel for sleeping arrangement.  Past Medical History:  Diagnosis Date  . Bipolar 1 disorder with moderate mania ([facility]) 12/30/2013    No past surgical history on file.  No family history on file.  Social History   Socioeconomic History  . Marital status: Married    Spouse name: Not on file  . Number of children: Not on file  . Years of education: Not on file  . Highest education level: Not on file  Occupational History  . Not on file  Social Needs  . Financial resource strain: Not on file  . Food insecurity:    Worry: Not on file    Inability: Not on file  . Transportation needs:    Medical: Not on file    Non-medical: Not on file  Tobacco Use  . Smoking status: Current Every Day Smoker  . Smokeless tobacco: Current User  Substance and Sexual Activity  . Alcohol use: No  . Drug use: No  . Sexual activity: Not on file  Lifestyle  . Physical activity:    Days per week: Not on file    Minutes per session: Not on file  . Stress: Not on file  Relationships  . Social connections:    Talks on phone: Not on file    Gets together: Not on file    Attends religious service: Not on file    Active member of club or organization: Not on file    Attends meetings of clubs or organizations: Not on file    Relationship status: Not on file  . Intimate partner violence:    Fear of current or ex partner: Not on file    Emotionally abused: Not on file    Physically abused: Not on file    Forced sexual activity: Not on file  Other  Topics Concern  . Not on file  Social History Narrative  . Not on file    Outpatient Medications Prior to Visit  Medication Sig Dispense Refill  . loratadine (CLARITIN) 10 MG tablet Take 1 tablet (10 mg total) by mouth daily. 30 tablet 11  . triamcinolone (NASACORT AQ) 55 MCG/ACT AERO nasal inhaler Place 2 sprays into the nose daily. 1 Inhaler 0  . carbamide peroxide (DEBROX) 6.5 % OTIC solution Place 5 drops into both ears 2 (two) times daily. (Patient not taking: Reported on 09/19/2017) 15 mL 1  . pseudoephedrine-guaifenesin (MUCINEX D) 60-600 MG 12 hr tablet Take 1 tablet by mouth every 12 (twelve) hours. (Patient not taking: Reported on 09/19/2017) 30 tablet 0  . Tdap (BOOSTRIX) 5-2.5-18.5 LF-MCG/0.5 injection Inject 0.5 mLs into the muscle as directed. (Patient not taking: Reported on 09/10/2018) 0.5 mL 0   No facility-administered medications prior to visit.     Allergies  Allergen Reactions  . Penicillins Itching    Review of Systems  Constitutional: Negative.   HENT: Negative.   Eyes: Negative.   Respiratory: Negative.   Cardiovascular: Negative.  Gastrointestinal: Negative.   Genitourinary: Negative.   Musculoskeletal: Negative.   Skin: Positive for itching and rash.  Neurological: Negative.   Endo/Heme/Allergies: Negative.   Psychiatric/Behavioral: Negative.        Objective:    Physical Exam  Constitutional: [name] oriented to person, place, and time. She appears well-developed and well-nourished.  HENT:  Head: Normocephalic and atraumatic.  Neck: Normal range of motion. Neck supple.  Cardiovascular: Normal rate and regular rhythm.  Pulmonary/Chest: Effort normal and breath sounds normal.  Abdominal: Bowel sounds are normal.  Musculoskeletal: Normal range of motion.  Neurological: [name] oriented to person, place, and time.  Skin: Rash noted. There is erythema.  Psychiatric: She has a normal mood and affect.    BP 117/78   Pulse (!) 101   Temp 98.2 F  (36.8 C) (Oral)   Wt 160 lb 9.6 oz (72.8 kg)   SpO2 100%   BMI 25.92 kg/m  Wt Readings from Last 3 Encounters:  09/10/18 160 lb 9.6 oz (72.8 kg)  09/19/17 164 lb 3.2 oz (74.5 kg)  09/03/17 163 lb 12.8 oz (74.3 kg)    Health Maintenance Due  Topic Date Due  . MAMMOGRAM  05/16/2017  . COLONOSCOPY  05/16/2017    There are no preventive care reminders to display for this patient.   Lab Results  Component Value Date   TSH 1.412 12/30/2013   Lab Results  Component Value Date   WBC 4.5 12/30/2013   HGB 13.8 12/30/2013   HCT 40.1 12/30/2013   MCV 88.5 12/30/2013   PLT 269 12/30/2013   Lab Results  Component Value Date   NA 144 09/19/2017   K 4.8 09/19/2017   CO2 25 09/19/2017   GLUCOSE 82 09/19/2017   BUN 11 09/19/2017   CREATININE 1.14 (H) 09/19/2017   BILITOT 0.3 09/19/2017   ALKPHOS 69 09/19/2017   AST 24 09/19/2017   ALT 14 09/19/2017   PROT 6.9 09/19/2017   ALBUMIN 4.1 09/19/2017   CALCIUM 9.6 09/19/2017   Lab Results  Component Value Date   CHOL 188 09/19/2017   Lab Results  Component Value Date   HDL 48 09/19/2017   Lab Results  Component Value Date   LDLCALC 118 (H) 09/19/2017   Lab Results  Component Value Date   TRIG 112 09/19/2017   Lab Results  Component Value Date   CHOLHDL 3.9 09/19/2017   Lab Results  Component Value Date   HGBA1C 5.6 12/30/2013       Assessment & Plan:   Problem List Items Addressed This Visit    None    Jenness was seen today for insect bite.  Diagnoses and all orders for this visit:  Insect bite of scalp, initial encounter tx empirically with abt/oint see pictures   Seasonal allergic rhinitis due to other allergic trigger -     loratadine (CLARITIN) 10 MG tablet; Take 1 tablet (10 mg total) by mouth daily.  Urge incontinence of urine Wears briefs d/w Kegel excercise  Other orders -     doxycycline (VIBRA-TABS) 100 MG tablet; Take 1 tablet (100 mg total) by mouth 2 (two) times daily. -      mupirocin cream (BACTROBAN) 2 %; Apply 1 application topically 2 (two) times daily. -     mupirocin ointment (BACTROBAN) 2 %; Place 1 application into the nose 2 (two) times daily.     Meds ordered this encounter  Medications  . doxycycline (VIBRA-TABS) 100 MG tablet  Sig: Take 1 tablet (100 mg total) by mouth 2 (two) times daily.    Dispense:  20 tablet    Refill:  0  . mupirocin cream (BACTROBAN) 2 %    Sig: Apply 1 application topically 2 (two) times daily.    Dispense:  15 g    Refill:  0     Grayce SessionsMichelle P Kloe Oates, NP

## 2020-02-03 ENCOUNTER — Telehealth: Payer: Self-pay | Admitting: Family Medicine

## 2020-02-03 NOTE — Telephone Encounter (Signed)
Patient is calling to schedule appt to renew CAFA. Please advise [phone]

## 2020-02-04 ENCOUNTER — Telehealth: Payer: Self-pay | Admitting: Family Medicine

## 2020-02-04 NOTE — Telephone Encounter (Signed)
Patient information was given to front desk to schedule an appointment.

## 2020-02-04 NOTE — Telephone Encounter (Signed)
Called pt to set up FINANCIAL appt. Pt did not answer and voicemail is not set up. Pt needs to pick up application at her earliest convenience to gather docs needed for the cafa appt. At the moment no appts are available. Need pt to call on Monday 25th to schedule that.

## 2020-02-23 ENCOUNTER — Ambulatory Visit: Payer: Self-pay | Admitting: Physician Assistant

## 2020-05-26 ENCOUNTER — Other Ambulatory Visit: Payer: Self-pay

## 2020-05-26 ENCOUNTER — Encounter (HOSPITAL_COMMUNITY): Payer: Self-pay

## 2020-05-26 ENCOUNTER — Ambulatory Visit (HOSPITAL_COMMUNITY)
Admission: EM | Admit: 2020-05-26 | Discharge: 2020-05-29 | Disposition: A | Discharge status: Home / Self Care | Payer: No Payment, Other | Attending: Psychiatry | Admitting: Psychiatry

## 2020-05-26 DIAGNOSIS — Z20822 Contact with and (suspected) exposure to covid-19: Secondary | ICD-10-CM | POA: Insufficient documentation

## 2020-05-26 DIAGNOSIS — F3110 Bipolar disorder, current episode manic without psychotic features, unspecified: Secondary | ICD-10-CM

## 2020-05-26 DIAGNOSIS — F3112 Bipolar disorder, current episode manic without psychotic features, moderate: Secondary | ICD-10-CM | POA: Insufficient documentation

## 2020-05-26 DIAGNOSIS — F17200 Nicotine dependence, unspecified, uncomplicated: Secondary | ICD-10-CM | POA: Insufficient documentation

## 2020-05-26 DIAGNOSIS — Z79899 Other long term (current) drug therapy: Secondary | ICD-10-CM | POA: Insufficient documentation

## 2020-05-26 DIAGNOSIS — F309 Manic episode, unspecified: Secondary | ICD-10-CM

## 2020-05-26 DIAGNOSIS — F319 Bipolar disorder, unspecified: Secondary | ICD-10-CM | POA: Diagnosis present

## 2020-05-26 LAB — POCT URINE DRUG SCREEN - MANUAL ENTRY (I-SCREEN)
POC Amphetamine UR: NOT DETECTED
POC Buprenorphine (BUP): NOT DETECTED
POC Cocaine UR: NOT DETECTED
POC Marijuana UR: NOT DETECTED
POC Methadone UR: NOT DETECTED
POC Methamphetamine UR: NOT DETECTED
POC Morphine: NOT DETECTED
POC Oxazepam (BZO): NOT DETECTED
POC Oxycodone UR: NOT DETECTED
POC Secobarbital (BAR): NOT DETECTED

## 2020-05-26 LAB — RESP PANEL BY RT-PCR (FLU A&B, COVID) ARPGX2
Influenza A by PCR: NEGATIVE
Influenza B by PCR: NEGATIVE
SARS Coronavirus 2 by RT PCR: NEGATIVE

## 2020-05-26 LAB — POC SARS CORONAVIRUS 2 AG: SARS Coronavirus 2 Ag: NEGATIVE

## 2020-05-26 MED ORDER — OLANZAPINE 5 MG PO TBDP
5.0000 mg | ORAL_TABLET | Freq: Every day | ORAL | Status: DC
  Administered 2020-05-27 – 2020-05-28 (×2): 5 mg via ORAL
  Filled 2020-05-26 (×3): qty 1
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
## 2020-05-26 NOTE — ED Notes (Signed)
Pt admitted to continuous assessment due to manic behaviors. Refused to take oral medication prescribed by practitioner. Loud in tone, rambling speech. Oriented to unit. Will monitor for safety.
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
## 2020-05-26 NOTE — ED Notes (Signed)
Called for STAT lab pickup

## 2020-05-26 NOTE — ED Notes (Signed)
Pt sleeping @ this time

## 2020-05-26 NOTE — ED Notes (Signed)
Pt asleep in bed. Respirations even and unlabored. Will continue to monitor for safety. ?
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
## 2020-05-27 MED ORDER — NICOTINE POLACRILEX 2 MG MT GUM
2.0000 mg | CHEWING_GUM | Freq: Once | OROMUCOSAL | Status: AC
  Administered 2020-05-27: 2 mg via ORAL
  Filled 2020-05-27: qty 1

## 2020-05-27 NOTE — ED Notes (Signed)
Pt sleeping at present, no distress noted, monitoring for safety. 

## 2020-05-27 NOTE — ED Notes (Signed)
Given salad

## 2020-05-27 NOTE — ED Provider Notes (Signed)
Behavioral Health Progress Note  Date and Time: 05/27/2020 10:54 AM Name: Natalie Brown MRN:  [id number]  Subjective:  "I don't understand. I just came here for a funeral and have been stuck here for five months"  Patient remains tangential and delusional; speaking in foreign accent. Alert and oriented x3; with the exception of situation. No insight into situation; remains delusional. 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 Patient denies any knowledge of past diagnosis of bipolar and states the woman at [facility] was prejudice to call the police on her to bring her here. Patient endorses taking "prevagen" for her brain at home. Patient states she hasn't seen her family since she was 55 and currently lives with her 53 year old "friend".   I continue to recommend inpatient treatment. AC Kim aware and there are no appropriate beds available for this patient at this time. SW notified.   Diagnosis:  Final diagnoses:  Bipolar I disorder, most recent episode (or current) manic (HCC)  Mania (HCC)    Total Time spent with patient: 15 minutes  Past Psychiatric History: Bipolar I Past Medical History:  Past Medical History:  Diagnosis Date  . Bipolar 1 disorder with moderate mania (HCC) 12/30/2013   History reviewed. No pertinent surgical history. Family History: History reviewed. No pertinent family history. Family Psychiatric  History: not noted Social History:  Social History   Substance and Sexual Activity  Alcohol Use No     Social History   Substance and Sexual Activity  Drug Use No    Social History   Socioeconomic History  . Marital status: Married    Spouse name: Not on file  . Number of children: Not on file  . Years of education: Not on file  . Highest education level: Not on file  Occupational History  . Not on file  Tobacco Use  . Smoking status: Current Every Day Smoker  . Smokeless  tobacco: Current User  Substance and Sexual Activity  . Alcohol use: No  . Drug use: No  . Sexual activity: Not on file  Other Topics Concern  . Not on file  Social History Narrative  . Not on file   Social Determinants of Health   Financial Resource Strain: Not on file  Food Insecurity: Not on file  Transportation Needs: Not on file  Physical Activity: Not on file  Stress: Not on file  Social Connections: Not on file   SDOH:  SDOH Screenings   Alcohol Screen: Not on file  Depression (PHQ2-9): Medium Risk  . PHQ-2 Score: 15  Financial Resource Strain: Not on file  Food Insecurity: Not on file  Housing: Not on file  Physical Activity: Not on file  Social Connections: Not on file  Stress: Not on file  Tobacco Use: High Risk  . Smoking Tobacco Use: Current Every Day Smoker  . Smokeless Tobacco Use: Current User  Transportation Needs: Not on file   Additional Social History:    Pain Medications: see MAR Prescriptions: see MAR Over the Counter: see MAR History of alcohol / drug use?: No history of alcohol / drug abuse  Sleep: Poor  Appetite:  Fair  Current Medications:  Current Facility-Administered Medications  Medication Dose Route Frequency Provider Last Rate Last Admin  . OLANZapine zydis (ZYPREXA) disintegrating tablet 5 mg  5 mg Oral Daily Leevy-Johnson, Inez Rosato A, NP   5 mg at 05/27/20 [phone]  Current Outpatient Medications  Medication Sig Dispense Refill  . pseudoephedrine-guaifenesin (MUCINEX D) 60-600 MG 12 hr tablet Take 1 tablet by mouth every 12 (twelve) hours. 30 tablet 0    Labs  Lab Results:  Admission on 05/26/2020  Component Date Value Ref Range Status  . SARS Coronavirus 2 by RT PCR 05/26/2020 NEGATIVE  NEGATIVE Final   Comment: (NOTE) SARS-CoV-2 target nucleic acids are NOT DETECTED.  The SARS-CoV-2 RNA is generally detectable in upper respiratory specimens during the acute phase of infection. The lowest concentration of SARS-CoV-2 viral  copies this assay can detect is 138 copies/mL. A negative result does not preclude SARS-Cov-2 infection and should not be used as the sole basis for treatment or other patient management decisions. A negative result may occur with  improper specimen collection/handling, submission of specimen other than nasopharyngeal swab, presence of viral mutation(s) within the areas targeted by this assay, and inadequate number of viral copies(<138 copies/mL). A negative result must be combined with clinical observations, patient history, and epidemiological information. The expected result is Negative.  Fact Sheet for Patients:  BloggerCourse.com  Fact Sheet for Healthcare Providers:  SeriousBroker.it  This test is no                          t yet approved or cleared by the [place] FDA and  has been authorized for detection and/or diagnosis of SARS-CoV-2 by FDA under an Emergency Use Authorization (EUA). This EUA will remain  in effect (meaning this test can be used) for the duration of the COVID-19 declaration under Section 564(b)(1) of the Act, 21 U.S.C.section 360bbb-3(b)(1), unless the authorization is terminated  or revoked sooner.      . Influenza A by PCR 05/26/2020 NEGATIVE  NEGATIVE Final  . Influenza B by PCR 05/26/2020 NEGATIVE  NEGATIVE Final   Comment: (NOTE) The Xpert Xpress SARS-CoV-2/FLU/RSV plus assay is intended as an aid in the diagnosis of influenza from Nasopharyngeal swab specimens and should not be used as a sole basis for treatment. Nasal washings and aspirates are unacceptable for Xpert Xpress SARS-CoV-2/FLU/RSV testing.  Fact Sheet for Patients: BloggerCourse.com  Fact Sheet for Healthcare Providers: SeriousBroker.it  This test is not yet approved or cleared by the [place] FDA and has been authorized for detection and/or diagnosis of SARS-CoV-2  by FDA under an Emergency Use Authorization (EUA). This EUA will remain in effect (meaning this test can be used) for the duration of the COVID-19 declaration under Section 564(b)(1) of the Act, 21 U.S.C. section 360bbb-3(b)(1), unless the authorization is terminated or revoked.  Performed at [facility], [address] [place], [place] [address]   . POC Amphetamine UR 05/26/2020 None Detected  NONE DETECTED (Cut Off Level 1000 ng/mL) Final  . POC Secobarbital (BAR) 05/26/2020 None Detected  NONE DETECTED (Cut Off Level 300 ng/mL) Final  . POC Buprenorphine (BUP) 05/26/2020 None Detected  NONE DETECTED (Cut Off Level 10 ng/mL) Final  . POC Oxazepam (BZO) 05/26/2020 None Detected  NONE DETECTED (Cut Off Level 300 ng/mL) Final  . POC Cocaine UR 05/26/2020 None Detected  NONE DETECTED (Cut Off Level 300 ng/mL) Final  . POC Methamphetamine UR 05/26/2020 None Detected  NONE DETECTED (Cut Off Level 1000 ng/mL) Final  . POC Morphine 05/26/2020 None Detected  NONE DETECTED (Cut Off Level 300 ng/mL) Final  . POC Oxycodone UR 05/26/2020 None Detected  NONE DETECTED (Cut Off Level 100 ng/mL) Final  .  POC Methadone UR 05/26/2020 None Detected  NONE DETECTED (Cut Off Level 300 ng/mL) Final  . POC Marijuana UR 05/26/2020 None Detected  NONE DETECTED (Cut Off Level 50 ng/mL) Final  . SARS Coronavirus 2 Ag 05/26/2020 NEGATIVE  NEGATIVE Final   Comment: (NOTE) SARS-CoV-2 antigen NOT DETECTED.   Negative results are presumptive.  Negative results do not preclude SARS-CoV-2 infection and should not be used as the sole basis for treatment or other patient management decisions, including infection  control decisions, particularly in the presence of clinical signs and  symptoms consistent with COVID-19, or in those who have been in contact with the virus.  Negative results must be combined with clinical observations, patient history, and epidemiological information. The expected result is  Negative.  Fact Sheet for Patients: https://www.jennings-kim.com/  Fact Sheet for Healthcare Providers: https://alexander-rogers.biz/  This test is not yet approved or cleared by the [place] FDA and  has been authorized for detection and/or diagnosis of SARS-CoV-2 by FDA under an Emergency Use Authorization (EUA).  This EUA will remain in effect (meaning this test can be used) for the duration of  the COV                          ID-19 declaration under Section 564(b)(1) of the Act, 21 U.S.C. section 360bbb-3(b)(1), unless the authorization is terminated or revoked sooner.      Blood Alcohol level:  No results found for: [facility]  Metabolic Disorder Labs: Lab Results  Component Value Date   HGBA1C 5.6 12/30/2013   No results found for: PROLACTIN Lab Results  Component Value Date   CHOL 188 09/19/2017   TRIG 112 09/19/2017   HDL 48 09/19/2017   CHOLHDL 3.9 09/19/2017   VLDL 19 12/30/2013   LDLCALC 118 (H) 09/19/2017   LDLCALC 110 (H) 12/30/2013    Therapeutic Lab Levels: No results found for: LITHIUM No results found for: VALPROATE No components found for:  CBMZ  Physical Findings   GAD-7   Flowsheet Row Office Visit from 09/03/2017 in [facility] Office Visit from 08/07/2016 in [facility]  Total GAD-7 Score 6 11    PHQ2-9   Flowsheet Row ED from 05/26/2020 in [facility] Office Visit from 09/03/2017 in [facility] Office Visit from 08/07/2016 in [facility] Office Visit from 06/29/2015 in [facility] Office Visit from 02/22/2014 in [facility]  PHQ-2 Total Score 6 4 5  0 0  PHQ-9 Total Score 15 16 10  -- --       Musculoskeletal  Strength & Muscle Tone: within normal limits Gait & Station: normal Patient leans: N/A  Psychiatric  Specialty Exam  Presentation  General Appearance: Bizarre  Eye Contact:Fair  Speech:Clear and Coherent  Speech Volume:Normal  Handedness:Right   Mood and Affect  Mood:Anxious  Affect:Congruent   Thought Process  Thought Processes:Disorganized  Descriptions of Associations:Loose  Orientation:Full (Time, Place and Person)  Thought Content:Paranoid Ideation; Scattered; Tangential; Delusions  Hallucinations:Hallucinations: None (patient currently denies any) Description of Auditory Hallucinations: pt won't describe  Ideas of Reference:Delusions  Suicidal Thoughts:Suicidal Thoughts: No  Homicidal Thoughts:Homicidal Thoughts: No   Sensorium  Memory:Immediate Fair; Recent Fair; Remote Fair  Judgment:Impaired  Insight:Poor; Lacking   Executive Functions  Concentration:Fair  Attention Span:Fair  Recall:Fair  Fund of Knowledge:Fair  Language:Fair   Psychomotor  Activity  Psychomotor Activity:Psychomotor Activity: Decreased   Assets  Assets:Physical Health; Resilience; Social Support   Sleep  Sleep:Sleep: Fair   Physical Exam  Physical Exam Psychiatric:        Attention and Perception: [name] inattentive.        Mood and Affect: Mood is anxious.        Speech: Speech is tangential.        Behavior: Behavior is cooperative.        Thought Content: Thought content is delusional.        Judgment: Judgment is impulsive and inappropriate.    Review of Systems  Psychiatric/Behavioral: The patient is nervous/anxious and has insomnia.    Blood pressure 117/76, pulse 75, temperature 98.6 F (37 C), temperature source Oral, resp. rate 18, SpO2 100 %. There is no height or weight on file to calculate BMI.  Treatment Plan Summary: Daily contact with patient to assess and evaluate symptoms and progress in treatment, Medication management and Plan to continue to observe and monitor patient until placement on inpatient pscyhiatric unit available.    Loletta Parish, NP 05/27/2020 10:54 AM

## 2020-05-27 NOTE — ED Notes (Signed)
Pt sleeping @ this time

## 2020-05-27 NOTE — ED Notes (Signed)
Patient is alert and oriented. Patient denies SI/HI. Patient noted sitting on bed drinking fluids and interacting with staff. Monitoring continues.

## 2020-05-27 NOTE — ED Notes (Signed)
Pt sleeping at present, no distress noted, calm & cooperative.  Monitoring for safety.  Pt is IVC.

## 2020-05-27 NOTE — Care Management (Signed)
Per Dr. Bronwen Betters, patient meets criteria for inpatient hospitalization.  Per [facility], Kim there are no appropriate beds at [facility].  Per Dr. Bronwen Betters, the patient will be referred to other facilities:    [facility] Details  Fax          [address] [place], [place] [address]     Internal comment    [facility] Details  Fax        [phone]. [address], [place] [address]     Internal comment    [facility] Details  Fax        [address] [place] [address]     Internal comment    [facility] Details  Fax        [address] [place] [address]     Internal comment    [facility] Office Details  Fax        [address] [place] [address]     Internal comment    [facility] Details  Fax        [address] [place] [address]     Internal comment

## 2020-05-27 NOTE — ED Notes (Signed)
Given  breakfast

## 2020-05-27 NOTE — ED Notes (Signed)
Patient resting on bed. No distress noted. Monitoring continues.

## 2020-05-27 NOTE — ED Notes (Signed)
Patient resting in bed with eyes. Respirations even and non labored. No distress noted.

## 2020-05-28 MED ORDER — NICOTINE 21 MG/24HR TD PT24
21.0000 mg | MEDICATED_PATCH | Freq: Once | TRANSDERMAL | Status: AC
  Administered 2020-05-28: 21 mg via TRANSDERMAL
  Filled 2020-05-28: qty 1

## 2020-05-28 MED ORDER — OLANZAPINE 5 MG PO TBDP: 7.2500 mg | ORAL_TABLET | Freq: Every day | ORAL | Status: DC

## 2020-05-28 NOTE — ED Provider Notes (Signed)
Behavioral Health Progress Note  Date and Time: 05/28/2020 2:45 PM Name: Natalie Brown MRN:  [id number]  Subjective:  Natalie Brown was seen and evaluated face-to-face.  [name] denying suicidal or homicidal ideations.  Denies auditory or visual hallucination.  Patient appears less pressured during this assessment than compared to reports on admission.  Patient is circumstantial and delusional but pleasant.  Natalie Brown continues to request to be discharged.    NP spoke to patient regarding additional collateral however states " no one for you to talk with now"  CSW to follow-up with bed placement.  We will titrate Zyprexa 5 mg to 7.5 mg p.o. daily.  Continue overnight observation.  Support, encouragement and reassurance was provided.  Diagnosis:  Final diagnoses:  Bipolar I disorder, most recent episode (or current) manic (HCC)  Mania (HCC)    Total Time spent with patient: 15 minutes  Past Psychiatric History:  Past Medical History:  Past Medical History:  Diagnosis Date  . Bipolar 1 disorder with moderate mania (HCC) 12/30/2013   History reviewed. No pertinent surgical history. Family History: History reviewed. No pertinent family history. Family Psychiatric  History:  Social History:  Social History   Substance and Sexual Activity  Alcohol Use No     Social History   Substance and Sexual Activity  Drug Use No    Social History   Socioeconomic History  . Marital status: Married    Spouse name: Not on file  . Number of children: Not on file  . Years of education: Not on file  . Highest education level: Not on file  Occupational History  . Not on file  Tobacco Use  . Smoking status: Current Every Day Smoker  . Smokeless tobacco: Current User  Substance and Sexual Activity  . Alcohol use: No  . Drug use: No  . Sexual activity: Not on file  Other Topics Concern  . Not on file  Social History Narrative  . Not on file   Social Determinants of Health   Financial  Resource Strain: Not on file  Food Insecurity: Not on file  Transportation Needs: Not on file  Physical Activity: Not on file  Stress: Not on file  Social Connections: Not on file   SDOH:  SDOH Screenings   Alcohol Screen: Not on file  Depression (PHQ2-9): Medium Risk  . PHQ-2 Score: 15  Financial Resource Strain: Not on file  Food Insecurity: Not on file  Housing: Not on file  Physical Activity: Not on file  Social Connections: Not on file  Stress: Not on file  Tobacco Use: High Risk  . Smoking Tobacco Use: Current Every Day Smoker  . Smokeless Tobacco Use: Current User  Transportation Needs: Not on file   Additional Social History:    Pain Medications: see MAR Prescriptions: see MAR Over the Counter: see MAR History of alcohol / drug use?: No history of alcohol / drug abuse                    Sleep: Good  Appetite:  Good  Current Medications:  Current Facility-Administered Medications  Medication Dose Route Frequency Provider Last Rate Last Admin  . nicotine (NICODERM CQ - dosed in mg/24 hours) patch 21 mg  21 mg Transdermal Once Oneta Rack, NP   21 mg at 05/28/20 1302  . OLANZapine zydis (ZYPREXA) disintegrating tablet 5 mg  5 mg Oral Daily Leevy-Johnson, Brooke A, NP   5 mg at 05/28/20 1012  Current Outpatient Medications  Medication Sig Dispense Refill  . pseudoephedrine-guaifenesin (MUCINEX D) 60-600 MG 12 hr tablet Take 1 tablet by mouth every 12 (twelve) hours. 30 tablet 0    Labs  Lab Results:  Admission on 05/26/2020  Component Date Value Ref Range Status  . SARS Coronavirus 2 by RT PCR 05/26/2020 NEGATIVE  NEGATIVE Final   Comment: (NOTE) SARS-CoV-2 target nucleic acids are NOT DETECTED.  The SARS-CoV-2 RNA is generally detectable in upper respiratory specimens during the acute phase of infection. The lowest concentration of SARS-CoV-2 viral copies this assay can detect is 138 copies/mL. A negative result does not preclude  SARS-Cov-2 infection and should not be used as the sole basis for treatment or other patient management decisions. A negative result may occur with  improper specimen collection/handling, submission of specimen other than nasopharyngeal swab, presence of viral mutation(s) within the areas targeted by this assay, and inadequate number of viral copies(<138 copies/mL). A negative result must be combined with clinical observations, patient history, and epidemiological information. The expected result is Negative.  Fact Sheet for Patients:  BloggerCourse.com  Fact Sheet for Healthcare Providers:  SeriousBroker.it  This test is no                          t yet approved or cleared by the [place] FDA and  has been authorized for detection and/or diagnosis of SARS-CoV-2 by FDA under an Emergency Use Authorization (EUA). This EUA will remain  in effect (meaning this test can be used) for the duration of the COVID-19 declaration under Section 564(b)(1) of the Act, 21 U.S.C.section 360bbb-3(b)(1), unless the authorization is terminated  or revoked sooner.      . Influenza A by PCR 05/26/2020 NEGATIVE  NEGATIVE Final  . Influenza B by PCR 05/26/2020 NEGATIVE  NEGATIVE Final   Comment: (NOTE) The Xpert Xpress SARS-CoV-2/FLU/RSV plus assay is intended as an aid in the diagnosis of influenza from Nasopharyngeal swab specimens and should not be used as a sole basis for treatment. Nasal washings and aspirates are unacceptable for Xpert Xpress SARS-CoV-2/FLU/RSV testing.  Fact Sheet for Patients: BloggerCourse.com  Fact Sheet for Healthcare Providers: SeriousBroker.it  This test is not yet approved or cleared by the [place] FDA and has been authorized for detection and/or diagnosis of SARS-CoV-2 by FDA under an Emergency Use Authorization (EUA). This EUA will remain in effect  (meaning this test can be used) for the duration of the COVID-19 declaration under Section 564(b)(1) of the Act, 21 U.S.C. section 360bbb-3(b)(1), unless the authorization is terminated or revoked.  Performed at [facility], [address], [place], [place] [address]   . POC Amphetamine UR 05/26/2020 None Detected  NONE DETECTED (Cut Off Level 1000 ng/mL) Final  . POC Secobarbital (BAR) 05/26/2020 None Detected  NONE DETECTED (Cut Off Level 300 ng/mL) Final  . POC Buprenorphine (BUP) 05/26/2020 None Detected  NONE DETECTED (Cut Off Level 10 ng/mL) Final  . POC Oxazepam (BZO) 05/26/2020 None Detected  NONE DETECTED (Cut Off Level 300 ng/mL) Final  . POC Cocaine UR 05/26/2020 None Detected  NONE DETECTED (Cut Off Level 300 ng/mL) Final  . POC Methamphetamine UR 05/26/2020 None Detected  NONE DETECTED (Cut Off Level 1000 ng/mL) Final  . POC Morphine 05/26/2020 None Detected  NONE DETECTED (Cut Off Level 300 ng/mL) Final  . POC Oxycodone UR 05/26/2020 None Detected  NONE DETECTED (Cut Off Level 100 ng/mL) Final  .  POC Methadone UR 05/26/2020 None Detected  NONE DETECTED (Cut Off Level 300 ng/mL) Final  . POC Marijuana UR 05/26/2020 None Detected  NONE DETECTED (Cut Off Level 50 ng/mL) Final  . SARS Coronavirus 2 Ag 05/26/2020 NEGATIVE  NEGATIVE Final   Comment: (NOTE) SARS-CoV-2 antigen NOT DETECTED.   Negative results are presumptive.  Negative results do not preclude SARS-CoV-2 infection and should not be used as the sole basis for treatment or other patient management decisions, including infection  control decisions, particularly in the presence of clinical signs and  symptoms consistent with COVID-19, or in those who have been in contact with the virus.  Negative results must be combined with clinical observations, patient history, and epidemiological information. The expected result is Negative.  Fact Sheet for Patients: https://www.jennings-kim.com/  Fact Sheet  for Healthcare Providers: https://alexander-rogers.biz/  This test is not yet approved or cleared by the [place] FDA and  has been authorized for detection and/or diagnosis of SARS-CoV-2 by FDA under an Emergency Use Authorization (EUA).  This EUA will remain in effect (meaning this test can be used) for the duration of  the COV                          ID-19 declaration under Section 564(b)(1) of the Act, 21 U.S.C. section 360bbb-3(b)(1), unless the authorization is terminated or revoked sooner.      Blood Alcohol level:  No results found for: [facility]  Metabolic Disorder Labs: Lab Results  Component Value Date   HGBA1C 5.6 12/30/2013   No results found for: PROLACTIN Lab Results  Component Value Date   CHOL 188 09/19/2017   TRIG 112 09/19/2017   HDL 48 09/19/2017   CHOLHDL 3.9 09/19/2017   VLDL 19 12/30/2013   LDLCALC 118 (H) 09/19/2017   LDLCALC 110 (H) 12/30/2013    Therapeutic Lab Levels: No results found for: LITHIUM No results found for: VALPROATE No components found for:  CBMZ  Physical Findings   GAD-7   Flowsheet Row Office Visit from 09/03/2017 in [facility] Office Visit from 08/07/2016 in [facility]  Total GAD-7 Score 6 11    PHQ2-9   Flowsheet Row ED from 05/26/2020 in [facility] Office Visit from 09/03/2017 in [facility] Office Visit from 08/07/2016 in [facility] Office Visit from 06/29/2015 in [facility] Office Visit from 02/22/2014 in [facility]  PHQ-2 Total Score 6 4 5  0 0  PHQ-9 Total Score 15 16 10  - -       Musculoskeletal  Strength & Muscle Tone: within normal limits Gait & Station: normal Patient leans: N/A  Psychiatric Specialty Exam  Presentation  General Appearance: Bizarre  Eye Contact:Fair  Speech:Clear and  Coherent  Speech Volume:Normal  Handedness:Right   Mood and Affect  Mood:Anxious  Affect:Congruent   Thought Process  Thought Processes:Disorganized  Descriptions of Associations:Loose  Orientation:Full (Time, Place and Person)  Thought Content:Paranoid Ideation; Scattered; Tangential; Delusions  Hallucinations:Hallucinations: None (patient currently denies any)  Ideas of Reference:Delusions  Suicidal Thoughts:Suicidal Thoughts: No  Homicidal Thoughts:Homicidal Thoughts: No   Sensorium  Memory:Immediate Fair; Recent Fair; Remote Fair  Judgment:Impaired  Insight:Poor; Lacking   Executive Functions  Concentration:Fair  Attention Span:Fair  Recall:Fair  Fund of Knowledge:Fair  Language:Fair   Psychomotor Activity  Psychomotor Activity:Psychomotor Activity: Decreased  Assets  Assets:Physical Health; Resilience; Social Support   Sleep  Sleep:Sleep: Fair   Physical Exam  Physical Exam ROS Blood pressure 105/76, pulse 80, temperature 98.7 F (37.1 C), temperature source Oral, resp. rate 16, SpO2 100 %. There is no height or weight on file to calculate BMI.  Treatment Plan Summary: Daily contact with patient to assess and evaluate symptoms and progress in treatment and Medication management   -Continue with current medication regimen on 05/28/2020 except were noted  Increase Zyprexa Zydis 5 mg to 7.5 mg p.o. nightly Will make hydroxyzine 25 mg p.o. as needed 3 times daily for anxiety and sedation available.  CSW to continue working on inpatient admission placement Overnight observation with reassessment on 05/29/2020    Oneta Rack, NP 05/28/2020 2:45 PM

## 2020-05-28 NOTE — ED Notes (Signed)
Pt resting with eyes closed. RR even and unlabored. Safety maintained.

## 2020-05-28 NOTE — ED Notes (Signed)
Lunch given: salad

## 2020-05-28 NOTE — ED Notes (Signed)
Pt sleeping in no acute distress. RR even and unlabored. Safety maintained. 

## 2020-05-28 NOTE — ED Notes (Signed)
Pt sleeping@this time. breathing even and unlabored. Will continue to monitor for safety 

## 2020-05-28 NOTE — ED Notes (Addendum)
Pt sitting up on bed in no acute distress. Denies SI/HI/AVH. Answer all questions appropriately. Follow commands. Pt requesting to leave facility today. Pt reports sleeping well last night. Pt denies concerns or needs at present. Will continue to monitor for safety.

## 2020-05-28 NOTE — ED Notes (Signed)
Bible given at request

## 2020-05-28 NOTE — ED Notes (Signed)
Dinner given: PB&J and teddy ghrams

## 2020-05-28 NOTE — ED Notes (Signed)
Pt sleeping@this time. Breathing even and unlabored. Will continue to monitor for safety 

## 2020-05-28 NOTE — ED Notes (Signed)
Pt sleeping@this time breathing even and unlabored will continue to monitor for safety 

## 2020-05-28 NOTE — ED Notes (Signed)
Breakfast given: coffee (6 splenda, 4 cream) and muffin.

## 2020-05-29 MED ORDER — OLANZAPINE 7.5 MG PO TABS
7.5000 mg | ORAL_TABLET | Freq: Every day | ORAL | Status: DC
  Administered 2020-05-29: 7.5 mg via ORAL

## 2020-05-29 MED ORDER — OLANZAPINE 7.5 MG PO TABS: 7.5000 mg | ORAL_TABLET | Freq: Every day | ORAL | 0 refills | Status: DC

## 2020-05-29 NOTE — ED Notes (Signed)
Pt sleeping@this time. Will continue to monitor for safety 

## 2020-05-29 NOTE — Progress Notes (Signed)
Pt is sitting quietly reading her bible. Pt denies  Pain, SI, HI and AVH. Pt took her Zyprexa after a little prompting. Pt continues to be tangential. Staff will monitor for pt's safety.

## 2020-05-29 NOTE — Discharge Summary (Signed)
Natalie Brown to be D/C'd home per MD order. Discussed with the patient and all questions fully answered. An After Visit Summary was printed and given to the patient. Prescription also given to pt. Patient escorted out and D/C home via safe transport. Dickie La  05/29/2020 1:13 PM

## 2020-05-29 NOTE — ED Notes (Signed)
Breakfast given.  

## 2020-05-29 NOTE — ED Provider Notes (Signed)
[facility]BC/OBS ASAP Discharge Summary  Date and Time: 05/29/2020 12:02 PM  Name: Natalie Brown  MRN:  [id number]   Discharge Diagnoses:  Final diagnoses:  Bipolar I disorder, most recent episode (or current) manic (HCC)  Mania (HCC)    Subjective: Patient reports today that she is ready to go home.  She states that she has been here for 4 days she does not understand why the police brought her in.  She states that she was at  [facility] I was having a conversation with her caseworker there and then they contacted the police.  She states that she does remember being a little confused but did not think that she had said something to get her brought to the hospital.  She reports that the medications that she has been taking has helped her some and the only other medication she takes is Claritin and Mucinex.  She reported that she is been off her Trintellix in the past and has taken Rexulti.  She reports that she would like to be able to return home today and denies having any suicidal or homicidal ideations and denies any hallucinations.  Stay Summary: Patient is a 55 year old female that presented to the [facility] voluntarily with [place] [employer] and staff from the [facility].  Patient presented disheveled and multi layers of clothing and appeared elevated and tangential with delusions of grandeur, flight of ideas, presenting with manic symptoms.  Patient presented to be confused and disoriented and delusional.  Patient was admitted to the continuous observation unit for overnight assessment and started on Zyprexa 7.5 mg p.o. nightly.  Patient remained on the unit for approximately 70 hours and today presented alert and oriented and reporting what had happened prior to her coming to the hospital.  She still reports that she is not sure what she said to make them contact the police but feels that she is ready to go home.  Patient agreed to continue her medications and was provided with a prescription.  Patient states that she has  outpatient providers that she sees.  Patient reports that she has her own place to stay and is requesting transportation home.  Patient continued to deny any suicidal homicidal ideations and denied any hallucinations.  Patient does not appear to be paranoid, delusional, or manic today.  Patient does have some hyperverbal speech but does stop for conversation and does not listen and has logical thought process.  Total Time spent with patient: 30 minutes  Past Psychiatric History: Bipolar I Past Medical History:  Past Medical History:  Diagnosis Date  . Bipolar 1 disorder with moderate mania (HCC) 12/30/2013   History reviewed. No pertinent surgical history. Family History: History reviewed. No pertinent family history. Family Psychiatric History: None reported Social History:  Social History   Substance and Sexual Activity  Alcohol Use No     Social History   Substance and Sexual Activity  Drug Use No    Social History   Socioeconomic History  . Marital status: Married    Spouse name: Not on file  . Number of children: Not on file  . Years of education: Not on file  . Highest education level: Not on file  Occupational History  . Not on file  Tobacco Use  . Smoking status: Current Every Day Smoker  . Smokeless tobacco: Current User  Substance and Sexual Activity  . Alcohol use: No  . Drug use: No  . Sexual activity: Not on file  Other Topics Concern  .  Not on file  Social History Narrative  . Not on file   Social Determinants of Health   Financial Resource Strain: Not on file  Food Insecurity: Not on file  Transportation Needs: Not on file  Physical Activity: Not on file  Stress: Not on file  Social Connections: Not on file   SDOH:  SDOH Screenings   Alcohol Screen: Not on file  Depression (PHQ2-9): Medium Risk  . PHQ-2 Score: 15  Financial Resource Strain: Not on file  Food Insecurity: Not on file  Housing: Not on file  Physical Activity: Not on file   Social Connections: Not on file  Stress: Not on file  Tobacco Use: High Risk  . Smoking Tobacco Use: Current Every Day Smoker  . Smokeless Tobacco Use: Current User  Transportation Needs: Not on file    Has this patient used any form of tobacco in the last 30 days? (Cigarettes, Smokeless Tobacco, Cigars, and/or Pipes) A prescription for an FDA-approved tobacco cessation medication was offered at discharge and the patient refused  Current Medications:  Current Facility-Administered Medications  Medication Dose Route Frequency Provider Last Rate Last Admin  . nicotine (NICODERM CQ - dosed in mg/24 hours) patch 21 mg  21 mg Transdermal Once Oneta Rack, NP   21 mg at 05/28/20 1302  . OLANZapine (ZYPREXA) tablet 7.5 mg  7.5 mg Oral Daily Makaia Rappa, Gerlene Burdock, FNP   7.5 mg at 05/29/20 3846   Current Outpatient Medications  Medication Sig Dispense Refill  . pseudoephedrine-guaifenesin (MUCINEX D) 60-600 MG 12 hr tablet Take 1 tablet by mouth every 12 (twelve) hours. 30 tablet 0    PTA Medications: (Not in a hospital admission)   Musculoskeletal  Strength & Muscle Tone: within normal limits Gait & Station: normal Patient leans: N/A  Psychiatric Specialty Exam  Presentation  General Appearance: Appropriate for Environment; Casual  Eye Contact:Good  Speech:Clear and Coherent; Normal Rate  Speech Volume:Normal  Handedness:Right   Mood and Affect  Mood:Euthymic  Affect:Appropriate; Congruent   Thought Process  Thought Processes:Coherent  Descriptions of Associations:Intact  Orientation:Full (Time, Place and Person)  Thought Content:WDL  Hallucinations:Hallucinations: None  Ideas of Reference:None  Suicidal Thoughts:Suicidal Thoughts: No  Homicidal Thoughts:Homicidal Thoughts: No   Sensorium  Memory:Immediate Good; Recent Good; Remote Good  Judgment:Good  Insight:Fair   Executive Functions  Concentration:Good  Attention  Span:Good  Recall:Good  Fund of Knowledge:Good  Language:Good   Psychomotor Activity  Psychomotor Activity:Psychomotor Activity: Normal   Assets  Assets:Communication Skills; Desire for Improvement; Financial Resources/Insurance; Housing   Sleep  Sleep:Sleep: Fair   Physical Exam  Physical Exam Vitals and nursing note reviewed.  Constitutional:      Appearance: [name] well-developed.  HENT:     Head: Normocephalic.  Eyes:     Pupils: Pupils are equal, round, and reactive to light.  Cardiovascular:     Rate and Rhythm: Normal rate.  Pulmonary:     Effort: Pulmonary effort is normal.  Musculoskeletal:        General: Normal range of motion.  Neurological:     Mental Status: [name] alert and oriented to person, place, and time.    Review of Systems  Constitutional: Negative.   HENT: Negative.   Eyes: Negative.   Respiratory: Negative.   Cardiovascular: Negative.   Gastrointestinal: Negative.   Genitourinary: Negative.   Musculoskeletal: Negative.   Skin: Negative.   Neurological: Negative.   Endo/Heme/Allergies: Negative.   Psychiatric/Behavioral: Negative.    Blood pressure 122/78,  pulse 98, temperature 97.8 F (36.6 C), temperature source Oral, resp. rate 18, SpO2 100 %. There is no height or weight on file to calculate BMI.  Demographic Factors:  Low socioeconomic status  Loss Factors: NA  Historical Factors: NA  Risk Reduction Factors:   Positive social support and Positive therapeutic relationship  Continued Clinical Symptoms:  Previous Psychiatric Diagnoses and Treatments  Cognitive Features That Contribute To Risk:  None    Suicide Risk:  Minimal: No identifiable suicidal ideation.  Patients presenting with no risk factors but with morbid ruminations; may be classified as minimal risk based on the severity of the depressive symptoms  Plan Of Care/Follow-up recommendations:  Continue activity as tolerated. Continue diet as recommended  by your PCP. Ensure to keep all appointments with outpatient providers.  Disposition: Discharge home  Maryfrances Bunnell, FNP 05/29/2020, 12:02 PM

## 2020-05-29 NOTE — Discharge Instructions (Addendum)

## 2020-06-20 ENCOUNTER — Ambulatory Visit: Payer: Self-pay | Attending: Family Medicine | Admitting: Family Medicine

## 2020-06-20 ENCOUNTER — Encounter: Payer: Self-pay | Admitting: Family Medicine

## 2020-06-20 ENCOUNTER — Other Ambulatory Visit: Payer: Self-pay

## 2020-06-20 ENCOUNTER — Ambulatory Visit (HOSPITAL_COMMUNITY): Payer: No Payment, Other | Admitting: Licensed Clinical Social Worker

## 2020-06-20 VITALS — BP 118/75 | HR 93 | Wt 159.0 lb

## 2020-06-20 DIAGNOSIS — Z1231 Encounter for screening mammogram for malignant neoplasm of breast: Secondary | ICD-10-CM

## 2020-06-20 DIAGNOSIS — Z1211 Encounter for screening for malignant neoplasm of colon: Secondary | ICD-10-CM

## 2020-06-20 DIAGNOSIS — Z124 Encounter for screening for malignant neoplasm of cervix: Secondary | ICD-10-CM

## 2020-06-20 DIAGNOSIS — Z0000 Encounter for general adult medical examination without abnormal findings: Secondary | ICD-10-CM

## 2020-06-20 NOTE — Patient Instructions (Signed)
Health Maintenance, Female Adopting a healthy lifestyle and getting preventive care are important in promoting health and wellness. Ask your health care provider about:  The right schedule for you to have regular tests and exams.  Things you can do on your own to prevent diseases and keep yourself healthy. What should I know about diet, weight, and exercise? Eat a healthy diet  Eat a diet that includes plenty of vegetables, fruits, low-fat dairy products, and lean protein.  Do not eat a lot of foods that are high in solid fats, added sugars, or sodium.   Maintain a healthy weight Body mass index (BMI) is used to identify weight problems. It estimates body fat based on height and weight. Your health care provider can help determine your BMI and help you achieve or maintain a healthy weight. Get regular exercise Get regular exercise. This is one of the most important things you can do for your health. Most adults should:  Exercise for at least 150 minutes each week. The exercise should increase your heart rate and make you sweat (moderate-intensity exercise).  Do strengthening exercises at least twice a week. This is in addition to the moderate-intensity exercise.  Spend less time sitting. Even light physical activity can be beneficial. Watch cholesterol and blood lipids Have your blood tested for lipids and cholesterol at 53 years of age, then have this test every 5 years. Have your cholesterol levels checked more often if:  Your lipid or cholesterol levels are high.  You are older than 53 years of age.  You are at high risk for heart disease. What should I know about cancer screening? Depending on your health history and family history, you may need to have cancer screening at various ages. This may include screening for:  Breast cancer.  Cervical cancer.  Colorectal cancer.  Skin cancer.  Lung cancer. What should I know about heart disease, diabetes, and high blood  pressure? Blood pressure and heart disease  High blood pressure causes heart disease and increases the risk of stroke. This is more likely to develop in people who have high blood pressure readings, are of African descent, or are overweight.  Have your blood pressure checked: ? Every 3-5 years if you are 18-39 years of age. ? Every year if you are 40 years old or older. Diabetes Have regular diabetes screenings. This checks your fasting blood sugar level. Have the screening done:  Once every three years after age 40 if you are at a normal weight and have a low risk for diabetes.  More often and at a younger age if you are overweight or have a high risk for diabetes. What should I know about preventing infection? Hepatitis B If you have a higher risk for hepatitis B, you should be screened for this virus. Talk with your health care provider to find out if you are at risk for hepatitis B infection. Hepatitis C Testing is recommended for:  Everyone born from 1945 through 1965.  Anyone with known risk factors for hepatitis C. Sexually transmitted infections (STIs)  Get screened for STIs, including gonorrhea and chlamydia, if: ? You are sexually active and are younger than 53 years of age. ? You are older than 53 years of age and your health care provider tells you that you are at risk for this type of infection. ? Your sexual activity has changed since you were last screened, and you are at increased risk for chlamydia or gonorrhea. Ask your health care provider   if you are at risk.  Ask your health care provider about whether you are at high risk for HIV. Your health care provider may recommend a prescription medicine to help prevent HIV infection. If you choose to take medicine to prevent HIV, you should first get tested for HIV. You should then be tested every 3 months for as long as you are taking the medicine. Pregnancy  If you are about to stop having your period (premenopausal) and  you may become pregnant, seek counseling before you get pregnant.  Take 400 to 800 micrograms (mcg) of folic acid every day if you become pregnant.  Ask for birth control (contraception) if you want to prevent pregnancy. Osteoporosis and menopause Osteoporosis is a disease in which the bones lose minerals and strength with aging. This can result in bone fractures. If you are 65 years old or older, or if you are at risk for osteoporosis and fractures, ask your health care provider if you should:  Be screened for bone loss.  Take a calcium or vitamin D supplement to lower your risk of fractures.  Be given hormone replacement therapy (HRT) to treat symptoms of menopause. Follow these instructions at home: Lifestyle  Do not use any products that contain nicotine or tobacco, such as cigarettes, e-cigarettes, and chewing tobacco. If you need help quitting, ask your health care provider.  Do not use street drugs.  Do not share needles.  Ask your health care provider for help if you need support or information about quitting drugs. Alcohol use  Do not drink alcohol if: ? Your health care provider tells you not to drink. ? You are pregnant, may be pregnant, or are planning to become pregnant.  If you drink alcohol: ? Limit how much you use to 0-1 drink a day. ? Limit intake if you are breastfeeding.  Be aware of how much alcohol is in your drink. In the [place] one drink equals one 12 oz bottle of beer (355 mL), one 5 oz glass of wine (148 mL), or one 1 oz glass of hard liquor (44 mL). General instructions  Schedule regular health, dental, and eye exams.  Stay current with your vaccines.  Tell your health care provider if: ? You often feel depressed. ? You have ever been abused or do not feel safe at home. Summary  Adopting a healthy lifestyle and getting preventive care are important in promoting health and wellness.  Follow your health care provider's instructions about healthy  diet, exercising, and getting tested or screened for diseases.  Follow your health care provider's instructions on monitoring your cholesterol and blood pressure. This information is not intended to replace advice given to you by your health care provider. Make sure you discuss any questions you have with your health care provider. Document Revised: 04/01/2018 Document Reviewed: 04/01/2018 Elsevier Patient Education  2021 Elsevier Inc.  

## 2020-06-20 NOTE — Progress Notes (Signed)
Subjective:  Patient ID: Natalie Brown, female    DOB: 1965/05/12  Age: 55 y.o. MRN: [id number]  CC: Annual Exam   HPI Natalie Brown is a 55 year old female who presents today for an annual physical exam. [name] due for mammogram, Pap smear and colonoscopy. Does not exercise regularly and compliance with healthy diet cannot be ascertained. She has no additional concerns today.  Past Medical History:  Diagnosis Date  . Bipolar 1 disorder with moderate mania ([facility]) 12/30/2013    No past surgical history on file.  No family history on file.  Allergies  Allergen Reactions  . Penicillins Itching    Outpatient Medications Prior to Visit  Medication Sig Dispense Refill  . OLANZapine (ZYPREXA) 7.5 MG tablet Take 1 tablet (7.5 mg total) by mouth daily. (Patient not taking: Reported on 06/20/2020) 30 tablet 0  . pseudoephedrine-guaifenesin (MUCINEX D) 60-600 MG 12 hr tablet Take 1 tablet by mouth every 12 (twelve) hours. (Patient not taking: Reported on 06/20/2020) 30 tablet 0   No facility-administered medications prior to visit.     ROS Review of Systems  Constitutional: Negative for activity change, appetite change and fatigue.  HENT: Negative for congestion, sinus pressure and sore throat.   Eyes: Negative for visual disturbance.  Respiratory: Negative for cough, chest tightness, shortness of breath and wheezing.   Cardiovascular: Negative for chest pain and palpitations.  Gastrointestinal: Negative for abdominal distention, abdominal pain and constipation.  Endocrine: Negative for polydipsia.  Genitourinary: Negative for dysuria and frequency.  Musculoskeletal: Negative for arthralgias and back pain.  Skin: Negative for rash.  Neurological: Negative for tremors, light-headedness and numbness.  Hematological: Does not bruise/bleed easily.  Psychiatric/Behavioral: Negative for agitation and behavioral problems.    Objective:  BP 118/75   Pulse 93   Wt 159 lb (72.1  kg)   SpO2 98%   BMI 25.66 kg/m   BP/Weight 06/20/2020 09/10/2018 5/68/6168  Systolic BP 372 902 111  Diastolic BP 75 78 76  Wt. (Lbs) 159 160.6 164.2  BMI 25.66 25.92 26.5  Some encounter information is confidential and restricted. Go to Review Flowsheets activity to see all data.      Physical Exam Constitutional:      General: [name] not in acute distress.    Appearance: [name] well-developed and well-nourished. [name] not diaphoretic.  HENT:     Head: Normocephalic.     Right Ear: External ear normal.     Left Ear: External ear normal.     Nose: Nose normal.     Mouth/Throat:     Mouth: Oropharynx is clear and moist.  Eyes:     Extraocular Movements: EOM normal.     Conjunctiva/sclera: Conjunctivae normal.     Pupils: Pupils are equal, round, and reactive to light.  Neck:     Vascular: No JVD.  Cardiovascular:     Rate and Rhythm: Normal rate and regular rhythm.     Pulses: Intact distal pulses.     Heart sounds: Normal heart sounds. No murmur heard. No gallop.   Pulmonary:     Effort: Pulmonary effort is normal. No respiratory distress.     Breath sounds: Normal breath sounds. No wheezing or rales.  Chest:     Chest wall: No tenderness.  Breasts:     Right: Mass (6 o'clock) present. No nipple discharge or tenderness.     Left: No mass, nipple discharge or tenderness.    Abdominal:  General: Bowel sounds are normal. There is no distension.     Palpations: Abdomen is soft. There is no mass.     Tenderness: There is no abdominal tenderness.  Musculoskeletal:        General: No tenderness or edema. Normal range of motion.     Cervical back: Normal range of motion.  Skin:    General: Skin is warm and dry.  Neurological:     Mental Status: [name] alert and oriented to person, place, and time.     Deep Tendon Reflexes: Reflexes are normal and symmetric.  Psychiatric:        Mood and Affect: Mood and affect normal.     CMP Latest Ref Rng & Units 09/19/2017  12/30/2013  Glucose 65 - 99 mg/dL 82 92  BUN 6 - 24 mg/dL 11 12  Creatinine 0.57 - 1.00 mg/dL 1.14(H) 0.97  Sodium 134 - 144 mmol/L 144 139  Potassium 3.5 - 5.2 mmol/L 4.8 4.3  Chloride 96 - 106 mmol/L 104 103  CO2 20 - 29 mmol/L 25 25  Calcium 8.7 - 10.2 mg/dL 9.6 8.9  Total Protein 6.0 - 8.5 g/dL 6.9 7.3  Total Bilirubin 0.0 - 1.2 mg/dL 0.3 0.5  Alkaline Phos 39 - 117 IU/L 69 63  AST 0 - 40 IU/L 24 21  ALT 0 - 32 IU/L 14 13    Lipid Panel     Component Value Date/Time   CHOL 188 09/19/2017 1000   TRIG 112 09/19/2017 1000   HDL 48 09/19/2017 1000   CHOLHDL 3.9 09/19/2017 1000   CHOLHDL 3.6 12/30/2013 1216   VLDL 19 12/30/2013 1216   LDLCALC 118 (H) 09/19/2017 1000    CBC    Component Value Date/Time   WBC 4.5 12/30/2013 1216   RBC 4.53 12/30/2013 1216   HGB 13.8 12/30/2013 1216   HCT 40.1 12/30/2013 1216   PLT 269 12/30/2013 1216   MCV 88.5 12/30/2013 1216   MCH 30.5 12/30/2013 1216   MCHC 34.4 12/30/2013 1216   RDW 14.5 12/30/2013 1216   LYMPHSABS 1.9 12/30/2013 1216   MONOABS 0.2 12/30/2013 1216   EOSABS 0.1 12/30/2013 1216   BASOSABS 0.0 12/30/2013 1216    Lab Results  Component Value Date   HGBA1C 5.6 12/30/2013    Assessment & Plan:  1. Annual physical exam Counseled on 150 minutes of exercise per week, healthy eating (including decreased daily intake of saturated fats, cholesterol, added sugars, sodium), routine healthcare maintenance. - CMP14+EGFR; Future - Lipid panel; Future - CBC with Differential/Platelet; Future - CBC with Differential/Platelet - CMP14+EGFR  2. Encounter for screening mammogram for malignant neoplasm of breast - MM DIAG BREAST TOMO BILATERAL; Future - US BREAST LTD UNI LEFT INC AXILLA; Future  3. Screening for colon cancer - Fecal occult blood, imunochemical([facility])  4. Screening for cervical cancer - Cytology - PAP([facility]) - Cervicovaginal ancillary only    No orders of the defined types were placed  in this encounter.   Follow-up: Return in about 1 year (around 06/20/2021) for annual physical exam.       Charlott Rakes, MD, FAAFP. [facility] [place], [place]   06/20/2020, 12:34 PM

## 2020-06-21 ENCOUNTER — Other Ambulatory Visit: Payer: Self-pay

## 2020-06-21 ENCOUNTER — Ambulatory Visit: Payer: Self-pay | Attending: Family Medicine

## 2020-06-21 DIAGNOSIS — Z0000 Encounter for general adult medical examination without abnormal findings: Secondary | ICD-10-CM

## 2020-06-21 LAB — CERVICOVAGINAL ANCILLARY ONLY
Bacterial Vaginitis (gardnerella): POSITIVE — AB
Candida Glabrata: NEGATIVE
Candida Vaginitis: NEGATIVE
Chlamydia: NEGATIVE
Comment: NEGATIVE
Comment: NEGATIVE
Comment: NEGATIVE
Comment: NEGATIVE
Comment: NEGATIVE
Comment: NORMAL
Neisseria Gonorrhea: NEGATIVE
Trichomonas: POSITIVE — AB

## 2020-06-21 LAB — CYTOLOGY - PAP
Comment: NEGATIVE
Diagnosis: NEGATIVE
High risk HPV: NEGATIVE

## 2020-06-22 LAB — CMP14+EGFR
ALT: 15 IU/L (ref 0–32)
AST: 21 IU/L (ref 0–40)
Albumin/Globulin Ratio: 1.5 (ref 1.2–2.2)
Albumin: 4.2 g/dL (ref 3.8–4.9)
Alkaline Phosphatase: 91 IU/L (ref 44–121)
BUN/Creatinine Ratio: 13 (ref 9–23)
BUN: 14 mg/dL (ref 6–24)
Bilirubin Total: 0.3 mg/dL (ref 0.0–1.2)
CO2: 23 mmol/L (ref 20–29)
Calcium: 9.5 mg/dL (ref 8.7–10.2)
Chloride: 104 mmol/L (ref 96–106)
Creatinine, Ser: 1.08 mg/dL — ABNORMAL HIGH (ref 0.57–1.00)
Globulin, Total: 2.8 g/dL (ref 1.5–4.5)
Glucose: 96 mg/dL (ref 65–99)
Potassium: 4.8 mmol/L (ref 3.5–5.2)
Sodium: 145 mmol/L — ABNORMAL HIGH (ref 134–144)
Total Protein: 7 g/dL (ref 6.0–8.5)
eGFR: 61 mL/min/{1.73_m2} (ref >59)

## 2020-06-22 LAB — CBC WITH DIFFERENTIAL/PLATELET
Basophils Absolute: 0 10*3/uL (ref 0.0–0.2)
Basos: 1 %
EOS (ABSOLUTE): 0.2 10*3/uL (ref 0.0–0.4)
Eos: 5 %
Hematocrit: 40.4 % (ref 34.0–46.6)
Hemoglobin: 13.8 g/dL (ref 11.1–15.9)
Immature Grans (Abs): 0 10*3/uL (ref 0.0–0.1)
Immature Granulocytes: 0 %
Lymphocytes Absolute: 1.8 10*3/uL (ref 0.7–3.1)
Lymphs: 43 %
MCH: 30.5 pg (ref 26.6–33.0)
MCHC: 34.2 g/dL (ref 31.5–35.7)
MCV: 89 fL (ref 79–97)
Monocytes Absolute: 0.2 10*3/uL (ref 0.1–0.9)
Monocytes: 5 %
Neutrophils Absolute: 2 10*3/uL (ref 1.4–7.0)
Neutrophils: 46 %
Platelets: 263 10*3/uL (ref 150–450)
RBC: 4.53 x10E6/uL (ref 3.77–5.28)
RDW: 12.8 % (ref 11.7–15.4)
WBC: 4.2 10*3/uL (ref 3.4–10.8)

## 2020-06-22 LAB — LIPID PANEL
Chol/HDL Ratio: 3.1 ratio (ref 0.0–4.4)
Cholesterol, Total: 181 mg/dL (ref 100–199)
HDL: 59 mg/dL (ref >39)
LDL Chol Calc (NIH): 102 mg/dL — ABNORMAL HIGH (ref 0–99)
Triglycerides: 112 mg/dL (ref 0–149)
VLDL Cholesterol Cal: 20 mg/dL (ref 5–40)

## 2020-06-26 ENCOUNTER — Other Ambulatory Visit: Payer: Self-pay | Admitting: Family Medicine

## 2020-06-26 ENCOUNTER — Telehealth: Payer: Self-pay

## 2020-06-26 MED ORDER — METRONIDAZOLE 500 MG PO TABS: 500.0000 mg | ORAL_TABLET | Freq: Two times a day (BID) | ORAL | 0 refills | Status: DC

## 2020-06-26 MED FILL — metroNIDAZOLE 500 MG TABS: 500 | 7 days supply | Qty: 14 | Fill #0

## 2020-06-26 NOTE — Telephone Encounter (Signed)
Patient name and DOB has been verified Patient was informed of lab results. Patient had no questions.  

## 2020-06-26 NOTE — Telephone Encounter (Signed)
-----   Message from Hoy Register, MD sent at 06/26/2020  7:48 AM EST ----- Blood work is normal, PAP smear is normal, ancillary test is positive for bacteria vaginosis and Trichomonas. I have sent a rx to her Pharmacy for this. Because Ivery Quale is an STD her partner needs treatment and she needs a repeat test in 3 months

## 2020-06-26 NOTE — Telephone Encounter (Signed)
-----   Message from Hoy Register, MD sent at 06/22/2020  2:43 PM EST ----- Please inform the patient that labs are normal. Thank you.

## 2020-07-04 ENCOUNTER — Ambulatory Visit: Payer: Self-pay

## 2020-07-24 ENCOUNTER — Other Ambulatory Visit: Payer: Self-pay

## 2020-07-27 ENCOUNTER — Other Ambulatory Visit: Payer: Self-pay

## 2020-07-27 ENCOUNTER — Ambulatory Visit: Payer: Self-pay | Attending: Family Medicine

## 2020-10-13 ENCOUNTER — Ambulatory Visit: Payer: Self-pay

## 2020-10-24 ENCOUNTER — Ambulatory Visit: Payer: Self-pay

## 2020-10-30 ENCOUNTER — Ambulatory Visit: Payer: Self-pay

## 2020-11-01 ENCOUNTER — Other Ambulatory Visit: Payer: Self-pay

## 2020-11-01 ENCOUNTER — Ambulatory Visit: Payer: Self-pay | Attending: Family Medicine

## 2020-11-20 ENCOUNTER — Ambulatory Visit: Payer: Self-pay | Attending: Family Medicine | Admitting: Family Medicine

## 2020-11-20 ENCOUNTER — Encounter: Payer: Self-pay | Admitting: Family Medicine

## 2020-11-20 ENCOUNTER — Other Ambulatory Visit: Payer: Self-pay

## 2020-11-20 ENCOUNTER — Telehealth: Payer: Self-pay | Admitting: Family Medicine

## 2020-11-20 VITALS — BP 111/69 | HR 90 | Wt 163.0 lb

## 2020-11-20 DIAGNOSIS — A5901 Trichomonal vulvovaginitis: Secondary | ICD-10-CM

## 2020-11-20 DIAGNOSIS — F3112 Bipolar disorder, current episode manic without psychotic features, moderate: Secondary | ICD-10-CM

## 2020-11-20 MED ORDER — METRONIDAZOLE 500 MG PO TABS
500.0000 mg | ORAL_TABLET | Freq: Two times a day (BID) | ORAL | 0 refills | Status: DC
  Filled 2020-11-20: qty 14, 7d supply, fill #0

## 2020-11-20 NOTE — Telephone Encounter (Signed)
This patient has bipolar disorder and was manic in the clinic but has poor insight, not taking medications.  She declines Psychiatry referral.  Can you please reach out to her.

## 2020-11-20 NOTE — Progress Notes (Signed)
Subjective:  Patient ID: Natalie Brown, female    DOB: 07-20-1965  Age: 54 y.o. MRN: [id number]  CC: SEXUALLY TRANSMITTED DISEASE   HPI Natalie Brown is a 55 year old female with a history of bipolar disorder, Trichomonas vaginalis in 06/2020 who presents today after losing her medications.  Interval History: Informs me she was not completely treated for T vaginalis as she lost her medications.  She does have one stable sexual partner which she has had for over 15 years and does not use protection. Currently not seeing a Psychiatrist for management of her bipolar disorder and informs me she does not need to see one as nothing is wrong with her but rather 'something is wrong with her sister'. She has no acute concerns today.  Past Medical History:  Diagnosis Date   Bipolar 1 disorder with moderate mania ([facility]) 12/30/2013    History reviewed. No pertinent surgical history.  History reviewed. No pertinent family history.  Allergies  Allergen Reactions   Penicillins Itching    Outpatient Medications Prior to Visit  Medication Sig Dispense Refill   OLANZapine (ZYPREXA) 7.5 MG tablet Take 1 tablet (7.5 mg total) by mouth daily. (Patient not taking: No sig reported) 30 tablet 0   pseudoephedrine-guaifenesin (MUCINEX D) 60-600 MG 12 hr tablet Take 1 tablet by mouth every 12 (twelve) hours. (Patient not taking: No sig reported) 30 tablet 0   metroNIDAZOLE (FLAGYL) 500 MG tablet Take 1 tablet (500 mg total) by mouth 2 (two) times daily. (Patient not taking: Reported on 11/20/2020) 14 tablet 0   metroNIDAZOLE (FLAGYL) 500 MG tablet TAKE 1 TABLET (500 MG TOTAL) BY MOUTH 2 (TWO) TIMES DAILY. (Patient not taking: Reported on 11/20/2020) 14 tablet 0   No facility-administered medications prior to visit.     ROS Review of Systems  Constitutional:  Negative for activity change, appetite change and fatigue.  HENT:  Negative for congestion, sinus pressure and sore throat.   Eyes:  Negative  for visual disturbance.  Respiratory:  Negative for cough, chest tightness, shortness of breath and wheezing.   Cardiovascular:  Negative for chest pain and palpitations.  Gastrointestinal:  Negative for abdominal distention, abdominal pain and constipation.  Endocrine: Negative for polydipsia.  Genitourinary:  Negative for dysuria and frequency.  Musculoskeletal:  Negative for arthralgias and back pain.  Skin:  Negative for rash.  Neurological:  Negative for tremors, light-headedness and numbness.  Hematological:  Does not bruise/bleed easily.  Psychiatric/Behavioral:  Positive for behavioral problems. Negative for agitation.    Objective:  BP 111/69   Pulse 90   Wt 163 lb (73.9 kg)   SpO2 98%   BMI 26.31 kg/m   BP/Weight 11/20/2020 06/20/2020 09/10/2018  Systolic BP 111 118 117  Diastolic BP 69 75 78  Wt. (Lbs) 163 159 160.6  BMI 26.31 25.66 25.92  Some encounter information is confidential and restricted. Go to Review Flowsheets activity to see all data.      Physical Exam Constitutional:      Appearance: [name] well-developed.  Cardiovascular:     Rate and Rhythm: Normal rate.     Heart sounds: Normal heart sounds. No murmur heard. Pulmonary:     Effort: Pulmonary effort is normal.     Breath sounds: Normal breath sounds. No wheezing or rales.  Chest:     Chest wall: No tenderness.  Abdominal:     General: Bowel sounds are normal. There is no distension.     Palpations: Abdomen is soft.  There is no mass.     Tenderness: There is no abdominal tenderness.  Musculoskeletal:        General: Normal range of motion.     Right lower leg: No edema.     Left lower leg: No edema.  Neurological:     Mental Status: [name] alert and oriented to person, place, and time.  Psychiatric:        Mood and Affect: Mood normal.        Speech: Speech is rapid and pressured.        Behavior: Behavior is hyperactive.    CMP Latest Ref Rng & Units 06/21/2020 09/19/2017 12/30/2013  Glucose  65 - 99 mg/dL 96 82 92  BUN 6 - 24 mg/dL 14 11 12   Creatinine 0.57 - 1.00 mg/dL ) 7.32(K) 0.25(K  Sodium 134 - 144 mmol/L 145(H) 144 139  Potassium 3.5 - 5.2 mmol/L 4.8 4.8 4.3  Chloride 96 - 106 mmol/L 104 104 103  CO2 20 - 29 mmol/L 23 25 25   Calcium 8.7 - 10.2 mg/dL 9.5 9.6 8.9  Total Protein 6.0 - 8.5 g/dL 7.0 6.9 7.3  Total Bilirubin 0.0 - 1.2 mg/dL 0.3 0.3 0.5  Alkaline Phos 44 - 121 IU/L 91 69 63  AST 0 - 40 IU/L 21 24 21   ALT 0 - 32 IU/L 15 14 13     Lipid Panel     Component Value Date/Time   CHOL 181 06/21/2020 1107   TRIG 112 06/21/2020 1107   HDL 59 06/21/2020 1107   CHOLHDL 3.1 06/21/2020 1107   CHOLHDL 3.6 12/30/2013 1216   VLDL 19 12/30/2013 1216   LDLCALC 102 (H) 06/21/2020 1107    CBC    Component Value Date/Time   WBC 4.2 06/21/2020 1106   WBC 4.5 12/30/2013 1216   RBC 4.53 06/21/2020 1106   RBC 4.53 12/30/2013 1216   HGB 13.8 06/21/2020 1106   HCT 40.4 06/21/2020 1106   PLT 263 06/21/2020 1106   MCV 89 06/21/2020 1106   MCH 30.5 06/21/2020 1106   MCH 30.5 12/30/2013 1216   MCHC 34.2 06/21/2020 1106   MCHC 34.4 12/30/2013 1216   RDW 12.8 06/21/2020 1106   LYMPHSABS 1.8 06/21/2020 1106   MONOABS 0.2 12/30/2013 1216   EOSABS 0.2 06/21/2020 1106   BASOSABS 0.0 06/21/2020 1106    Lab Results  Component Value Date   HGBA1C 5.6 12/30/2013    Assessment & Plan:  1. Trichomonas vaginitis I have prescribed another course of metronidazole given she never completed the first one Provided treatment for expedited partner therapy Safe sex practices encouraged - metroNIDAZOLE (FLAGYL) 500 MG tablet; Take 1 tablet (500 mg total) by mouth 2 (two) times daily.  Dispense: 14 tablet; Refill: 0  2. Bipolar 1 disorder with moderate mania ([facility]) Currently not on treatment and she is manic at the moment Declines psych referral and declines medication Will have LCSW reach out to her.    Meds ordered this encounter  Medications   metroNIDAZOLE (FLAGYL)  500 MG tablet    Sig: Take 1 tablet (500 mg total) by mouth 2 (two) times daily.    Dispense:  14 tablet    Refill:  0    Follow-up: Return in about 3 months (around 02/20/2021) for Retest for vaginal infection.       08/21/2020, MD, FAAFP. [facility] [place], 13/04/2020 Hoy Register   11/20/2020, 11:20 AM

## 2020-11-20 NOTE — Progress Notes (Signed)
Pt never to medication for trich. Need to be retested.

## 2020-11-20 NOTE — Patient Instructions (Signed)
Trichomoniasis Trichomoniasis is an STI (sexually transmitted infection) that can affect both women and men. In women, the outer area of the female genitalia (vulva) and the vagina are affected. In men, mainly the penis is affected, but theprostate and other reproductive organs can also be involved.  This condition can be treated with medicine. It often has no symptoms (is asymptomatic), especially in men. If not treated, trichomoniasis can last for months oryears. What are the causes? This condition is caused by a parasite called Trichomonas vaginalis. Trichomoniasis most often spreads from person to person (is contagious) through sexual contact. What increases the risk? The following factors may make you more likely to develop this condition: Having unprotected sex. Having sex with a partner who has trichomoniasis. Having multiple sexual partners. Having had previous trichomoniasis infections or other STIs. What are the signs or symptoms? In women, symptoms of trichomoniasis include: Abnormal vaginal discharge that is clear, white, gray, or yellow-green and foamy and has an unusual "fishy" odor. Itching and irritation of the vagina and vulva. Burning or pain during urination or sex. Redness and swelling of the genitals. In men, symptoms of trichomoniasis include: Penile discharge that may be foamy or contain pus. Pain in the penis. This may happen only when urinating. Itching or irritation inside the penis. Burning after urination or ejaculation. How is this diagnosed? In women, this condition may be found during a routine Pap test or physical exam. It may be found in men during a routine physical exam. Your health care provider may do tests to help diagnose this infection, such as: Urine tests (men and women). The following in women: Testing the pH of the vagina. A vaginal swab test that checks for the Trichomonas vaginalis parasite. Testing vaginal secretions. Your health care  provider may test you for other STIs, including HIV (human immunodeficiency virus). How is this treated? This condition is treated with medicine taken by mouth (orally), such as metronidazole or tinidazole, to fight the infection. Your sexual partner(s) also need to be tested and treated. If you are a woman and you plan to become pregnant or think you may be pregnant, tell your health care provider right away. Some medicines that are used to treat the infection should not be taken during pregnancy. Your health care provider may recommend over-the-counter medicines or creams to help relieve itching or irritation. You may be tested for infection again 3months after treatment. Follow these instructions at home: Take and use over-the-counter and prescription medicines, including creams, only as told by your health care provider. Take your antibiotic medicine as told by your health care provider. Do not stop taking the antibiotic even if you start to feel better. Do not have sex until 7-10 days after you finish your medicine, or until your health care provider approves. Ask your health care provider when you may start to have sex again. (Women) Do not douche or wear tampons while you have the infection. Discuss your infection with your sexual partner(s). Make sure that your partner gets tested and treated, if necessary. Keep all follow-up visits as told by your health care provider. This is important. How is this prevented?  Use condoms every time you have sex. Using condoms correctly and consistently can help protect against STIs. Avoid having multiple sexual partners. Talk with your sexual partner about any symptoms that either of you may have, as well as any history of STIs. Get tested for STIs and STDs (sexually transmitted diseases) before you have sex. Ask your partner   to do the same. Do not have sexual contact if you have symptoms of trichomoniasis or another STI. Contact a health care provider  if: You still have symptoms after you finish your medicine. You develop pain in your abdomen. You have pain when you urinate. You have bleeding after sex. You develop a rash. You feel nauseous or you vomit. You plan to become pregnant or think you may be pregnant. Summary Trichomoniasis is an STI (sexually transmitted infection) that can affect both women and men. This condition often has no symptoms (is asymptomatic), especially in men. Without treatment, this condition can last for months or years. You should not have sex until 7-10 days after you finish your medicine, or until your health care provider approves. Ask your health care provider when you may start to have sex again. Discuss your infection with your sexual partner(s). Make sure that your partner gets tested and treated, if necessary. This information is not intended to replace advice given to you by your health care provider. Make sure you discuss any questions you have with your healthcare provider. Document Revised: 01/20/2018 Document Reviewed: 01/20/2018 Elsevier Patient Education  2022 Elsevier Inc.  

## 2020-11-21 NOTE — Telephone Encounter (Signed)
Spoke with pt and scheduled appt for 12/04/20. Pt seemed confused initially when she answered the phone.

## 2020-12-04 ENCOUNTER — Institutional Professional Consult (permissible substitution): Payer: Self-pay | Admitting: Clinical

## 2020-12-27 ENCOUNTER — Institutional Professional Consult (permissible substitution): Payer: Self-pay | Admitting: Clinical

## 2021-01-01 ENCOUNTER — Institutional Professional Consult (permissible substitution): Payer: Self-pay | Admitting: Clinical

## 2021-01-03 ENCOUNTER — Ambulatory Visit: Payer: Self-pay | Attending: Family Medicine

## 2021-01-03 ENCOUNTER — Other Ambulatory Visit: Payer: Self-pay

## 2021-01-17 ENCOUNTER — Telehealth: Payer: Self-pay | Admitting: Family Medicine

## 2021-01-17 NOTE — Telephone Encounter (Signed)
I return Pt call, VM is full unable to leave msg 

## 2021-01-17 NOTE — Telephone Encounter (Signed)
Copied from CRM [id number]. Topic: General - Other >> Jan 12, 2021  4:53 PM Eliseo Gum, Deedra Ehrich wrote: Reason for CRM: Patient called back says has irs letter, for financial assitance. Please call back

## 2021-01-23 ENCOUNTER — Ambulatory Visit: Payer: Self-pay | Attending: Family Medicine

## 2021-01-23 ENCOUNTER — Other Ambulatory Visit: Payer: Self-pay

## 2021-09-05 ENCOUNTER — Emergency Department (HOSPITAL_COMMUNITY)
Admission: EM | Admit: 2021-09-05 | Discharge: 2021-09-06 | Disposition: A | Discharge status: Other Acute Inpatient Hospital | Payer: Medicaid Other | Attending: Emergency Medicine | Admitting: Emergency Medicine

## 2021-09-05 ENCOUNTER — Other Ambulatory Visit: Payer: Self-pay

## 2021-09-05 ENCOUNTER — Emergency Department (HOSPITAL_COMMUNITY): Payer: Medicaid Other

## 2021-09-05 ENCOUNTER — Encounter (HOSPITAL_COMMUNITY): Payer: Self-pay | Admitting: Emergency Medicine

## 2021-09-05 DIAGNOSIS — F3112 Bipolar disorder, current episode manic without psychotic features, moderate: Secondary | ICD-10-CM | POA: Insufficient documentation

## 2021-09-05 DIAGNOSIS — F419 Anxiety disorder, unspecified: Secondary | ICD-10-CM | POA: Insufficient documentation

## 2021-09-05 DIAGNOSIS — Z20822 Contact with and (suspected) exposure to covid-19: Secondary | ICD-10-CM | POA: Insufficient documentation

## 2021-09-05 DIAGNOSIS — R45851 Suicidal ideations: Secondary | ICD-10-CM | POA: Diagnosis not present

## 2021-09-05 DIAGNOSIS — I10 Essential (primary) hypertension: Secondary | ICD-10-CM | POA: Diagnosis not present

## 2021-09-05 DIAGNOSIS — F319 Bipolar disorder, unspecified: Secondary | ICD-10-CM | POA: Diagnosis present

## 2021-09-05 HISTORY — DX: Essential (primary) hypertension: I10

## 2021-09-05 LAB — COMPREHENSIVE METABOLIC PANEL
ALT: 14 U/L (ref 0–44)
AST: 22 U/L (ref 15–41)
Albumin: 3.9 g/dL (ref 3.5–5.0)
Alkaline Phosphatase: 83 U/L (ref 38–126)
Anion gap: 7 (ref 5–15)
BUN: 11 mg/dL (ref 6–20)
CO2: 25 mmol/L (ref 22–32)
Calcium: 9.2 mg/dL (ref 8.9–10.3)
Chloride: 108 mmol/L (ref 98–111)
Creatinine, Ser: 1.1 mg/dL — ABNORMAL HIGH (ref 0.44–1.00)
GFR, Estimated: 60 mL/min — ABNORMAL LOW (ref >60)
Glucose, Bld: 83 mg/dL (ref 70–99)
Potassium: 4 mmol/L (ref 3.5–5.1)
Sodium: 140 mmol/L (ref 135–145)
Total Bilirubin: 0.7 mg/dL (ref 0.3–1.2)
Total Protein: 7.5 g/dL (ref 6.5–8.1)

## 2021-09-05 LAB — I-STAT BETA HCG BLOOD, ED (MC, WL, AP ONLY): I-stat hCG, quantitative: 6.4 m[IU]/mL — ABNORMAL HIGH (ref <5)

## 2021-09-05 LAB — CBC WITH DIFFERENTIAL/PLATELET
Abs Immature Granulocytes: 0.01 10*3/uL (ref 0.00–0.07)
Basophils Absolute: 0 10*3/uL (ref 0.0–0.1)
Basophils Relative: 1 %
Eosinophils Absolute: 0.2 10*3/uL (ref 0.0–0.5)
Eosinophils Relative: 4 %
HCT: 41.3 % (ref 36.0–46.0)
Hemoglobin: 13.7 g/dL (ref 12.0–15.0)
Immature Granulocytes: 0 %
Lymphocytes Relative: 33 %
Lymphs Abs: 1.9 10*3/uL (ref 0.7–4.0)
MCH: 30.9 pg (ref 26.0–34.0)
MCHC: 33.2 g/dL (ref 30.0–36.0)
MCV: 93.2 fL (ref 80.0–100.0)
Monocytes Absolute: 0.2 10*3/uL (ref 0.1–1.0)
Monocytes Relative: 4 %
Neutro Abs: 3.3 10*3/uL (ref 1.7–7.7)
Neutrophils Relative %: 58 %
Platelets: 300 10*3/uL (ref 150–400)
RBC: 4.43 MIL/uL (ref 3.87–5.11)
RDW: 13.8 % (ref 11.5–15.5)
WBC: 5.7 10*3/uL (ref 4.0–10.5)
nRBC: 0 % (ref 0.0–0.2)

## 2021-09-05 LAB — RAPID URINE DRUG SCREEN, HOSP PERFORMED
Amphetamines: NOT DETECTED
Barbiturates: NOT DETECTED
Benzodiazepines: NOT DETECTED
Cocaine: NOT DETECTED
Opiates: NOT DETECTED
Tetrahydrocannabinol: NOT DETECTED

## 2021-09-05 LAB — ETHANOL: Alcohol, Ethyl (B): <10 mg/dL (ref <10)

## 2021-09-05 LAB — TROPONIN I (HIGH SENSITIVITY): Troponin I (High Sensitivity): 3 ng/L (ref <18)

## 2021-09-05 LAB — RESP PANEL BY RT-PCR (FLU A&B, COVID) ARPGX2
Influenza A by PCR: NEGATIVE
Influenza B by PCR: NEGATIVE
SARS Coronavirus 2 by RT PCR: NEGATIVE

## 2021-09-05 MED ORDER — HYDROXYZINE HCL 25 MG PO TABS
25.0000 mg | ORAL_TABLET | Freq: Three times a day (TID) | ORAL | Status: DC | PRN
  Administered 2021-09-06: 25 mg via ORAL
  Filled 2021-09-05: qty 1

## 2021-09-05 MED ORDER — NICOTINE 14 MG/24HR TD PT24
14.0000 mg | MEDICATED_PATCH | Freq: Once | TRANSDERMAL | Status: AC
  Administered 2021-09-05: 14 mg via TRANSDERMAL
  Filled 2021-09-05: qty 1

## 2021-09-05 MED ORDER — OLANZAPINE 5 MG PO TABS
5.0000 mg | ORAL_TABLET | Freq: Every day | ORAL | Status: DC
  Administered 2021-09-05: 5 mg via ORAL
  Filled 2021-09-05: qty 1
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
## 2021-09-05 NOTE — ED Notes (Signed)
This RN inventoried the patient's belongings with [facility] CNA in purple zone. 3 bags were locked in the patient's locker number 6 and a valuable belongings envelope was sent to security and appropriately labeled by [facility] CNA.  ?

## 2021-09-05 NOTE — ED Notes (Signed)
Provider at bedside at this time
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
## 2021-09-05 NOTE — ED Notes (Signed)
TTS done 
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
## 2021-09-06 ENCOUNTER — Inpatient Hospital Stay (HOSPITAL_COMMUNITY)
Admission: AD | Admit: 2021-09-06 | Discharge: 2021-12-24 | DRG: 885 | Disposition: A | Discharge status: Other Acute Inpatient Hospital | Payer: Medicaid Other | Source: Intra-hospital | Attending: Family Medicine | Admitting: Family Medicine

## 2021-09-06 DIAGNOSIS — Z7189 Other specified counseling: Secondary | ICD-10-CM | POA: Diagnosis not present

## 2021-09-06 DIAGNOSIS — K5900 Constipation, unspecified: Secondary | ICD-10-CM | POA: Diagnosis not present

## 2021-09-06 DIAGNOSIS — R350 Frequency of micturition: Secondary | ICD-10-CM | POA: Diagnosis not present

## 2021-09-06 DIAGNOSIS — G9341 Metabolic encephalopathy: Secondary | ICD-10-CM | POA: Diagnosis not present

## 2021-09-06 DIAGNOSIS — F411 Generalized anxiety disorder: Secondary | ICD-10-CM | POA: Diagnosis present

## 2021-09-06 DIAGNOSIS — F319 Bipolar disorder, unspecified: Secondary | ICD-10-CM | POA: Diagnosis present

## 2021-09-06 DIAGNOSIS — Z20822 Contact with and (suspected) exposure to covid-19: Secondary | ICD-10-CM | POA: Diagnosis present

## 2021-09-06 DIAGNOSIS — I10 Essential (primary) hypertension: Secondary | ICD-10-CM | POA: Diagnosis not present

## 2021-09-06 DIAGNOSIS — F3112 Bipolar disorder, current episode manic without psychotic features, moderate: Secondary | ICD-10-CM | POA: Diagnosis not present

## 2021-09-06 DIAGNOSIS — R4182 Altered mental status, unspecified: Secondary | ICD-10-CM | POA: Diagnosis not present

## 2021-09-06 DIAGNOSIS — F05 Delirium due to known physiological condition: Secondary | ICD-10-CM | POA: Diagnosis present

## 2021-09-06 DIAGNOSIS — R000 Tachycardia, unspecified: Secondary | ICD-10-CM | POA: Diagnosis not present

## 2021-09-06 DIAGNOSIS — F0390 Unspecified dementia without behavioral disturbance: Secondary | ICD-10-CM | POA: Diagnosis present

## 2021-09-06 DIAGNOSIS — E43 Unspecified severe protein-calorie malnutrition: Secondary | ICD-10-CM | POA: Diagnosis not present

## 2021-09-06 DIAGNOSIS — R1110 Vomiting, unspecified: Secondary | ICD-10-CM | POA: Diagnosis not present

## 2021-09-06 DIAGNOSIS — R451 Restlessness and agitation: Secondary | ICD-10-CM | POA: Diagnosis not present

## 2021-09-06 DIAGNOSIS — F02818 Dementia in other diseases classified elsewhere, unspecified severity, with other behavioral disturbance: Secondary | ICD-10-CM | POA: Diagnosis present

## 2021-09-06 DIAGNOSIS — F03918 Unspecified dementia, unspecified severity, with other behavioral disturbance: Secondary | ICD-10-CM | POA: Diagnosis present

## 2021-09-06 DIAGNOSIS — F0282 Dementia in other diseases classified elsewhere, unspecified severity, with psychotic disturbance: Secondary | ICD-10-CM | POA: Diagnosis present

## 2021-09-06 DIAGNOSIS — T473X5A Adverse effect of saline and osmotic laxatives, initial encounter: Secondary | ICD-10-CM | POA: Diagnosis not present

## 2021-09-06 DIAGNOSIS — K30 Functional dyspepsia: Secondary | ICD-10-CM | POA: Diagnosis present

## 2021-09-06 DIAGNOSIS — R509 Fever, unspecified: Secondary | ICD-10-CM | POA: Diagnosis not present

## 2021-09-06 DIAGNOSIS — J302 Other seasonal allergic rhinitis: Secondary | ICD-10-CM | POA: Diagnosis present

## 2021-09-06 DIAGNOSIS — K521 Toxic gastroenteritis and colitis: Secondary | ICD-10-CM | POA: Diagnosis not present

## 2021-09-06 DIAGNOSIS — R32 Unspecified urinary incontinence: Secondary | ICD-10-CM | POA: Diagnosis not present

## 2021-09-06 DIAGNOSIS — U071 COVID-19: Secondary | ICD-10-CM | POA: Diagnosis not present

## 2021-09-06 DIAGNOSIS — F0283 Dementia in other diseases classified elsewhere, unspecified severity, with mood disturbance: Secondary | ICD-10-CM | POA: Diagnosis present

## 2021-09-06 DIAGNOSIS — G9340 Encephalopathy, unspecified: Secondary | ICD-10-CM | POA: Diagnosis not present

## 2021-09-06 DIAGNOSIS — F17213 Nicotine dependence, cigarettes, with withdrawal: Secondary | ICD-10-CM | POA: Diagnosis not present

## 2021-09-06 DIAGNOSIS — R4701 Aphasia: Secondary | ICD-10-CM | POA: Diagnosis not present

## 2021-09-06 DIAGNOSIS — R45851 Suicidal ideations: Secondary | ICD-10-CM | POA: Diagnosis not present

## 2021-09-06 DIAGNOSIS — Z560 Unemployment, unspecified: Secondary | ICD-10-CM

## 2021-09-06 DIAGNOSIS — E7220 Disorder of urea cycle metabolism, unspecified: Secondary | ICD-10-CM | POA: Diagnosis not present

## 2021-09-06 DIAGNOSIS — Z66 Do not resuscitate: Secondary | ICD-10-CM | POA: Diagnosis not present

## 2021-09-06 DIAGNOSIS — Z880 Allergy status to penicillin: Secondary | ICD-10-CM

## 2021-09-06 DIAGNOSIS — F312 Bipolar disorder, current episode manic severe with psychotic features: Principal | ICD-10-CM | POA: Diagnosis present

## 2021-09-06 DIAGNOSIS — F17200 Nicotine dependence, unspecified, uncomplicated: Secondary | ICD-10-CM | POA: Diagnosis present

## 2021-09-06 DIAGNOSIS — Z820 Family history of epilepsy and other diseases of the nervous system: Secondary | ICD-10-CM

## 2021-09-06 DIAGNOSIS — Z720 Tobacco use: Secondary | ICD-10-CM

## 2021-09-06 DIAGNOSIS — Z781 Physical restraint status: Secondary | ICD-10-CM

## 2021-09-06 DIAGNOSIS — R944 Abnormal results of kidney function studies: Secondary | ICD-10-CM | POA: Diagnosis present

## 2021-09-06 DIAGNOSIS — R7989 Other specified abnormal findings of blood chemistry: Secondary | ICD-10-CM | POA: Diagnosis not present

## 2021-09-06 DIAGNOSIS — Z5900 Homelessness unspecified: Secondary | ICD-10-CM

## 2021-09-06 DIAGNOSIS — G4700 Insomnia, unspecified: Secondary | ICD-10-CM | POA: Diagnosis present

## 2021-09-06 DIAGNOSIS — D649 Anemia, unspecified: Secondary | ICD-10-CM | POA: Diagnosis not present

## 2021-09-06 DIAGNOSIS — K117 Disturbances of salivary secretion: Secondary | ICD-10-CM | POA: Diagnosis not present

## 2021-09-06 DIAGNOSIS — F442 Dissociative stupor: Secondary | ICD-10-CM | POA: Diagnosis not present

## 2021-09-06 DIAGNOSIS — Z515 Encounter for palliative care: Secondary | ICD-10-CM | POA: Diagnosis not present

## 2021-09-06 DIAGNOSIS — F0284 Dementia in other diseases classified elsewhere, unspecified severity, with anxiety: Secondary | ICD-10-CM | POA: Diagnosis present

## 2021-09-06 DIAGNOSIS — T426X5A Adverse effect of other antiepileptic and sedative-hypnotic drugs, initial encounter: Secondary | ICD-10-CM | POA: Diagnosis not present

## 2021-09-06 DIAGNOSIS — R627 Adult failure to thrive: Secondary | ICD-10-CM | POA: Diagnosis not present

## 2021-09-06 DIAGNOSIS — E875 Hyperkalemia: Secondary | ICD-10-CM | POA: Diagnosis not present

## 2021-09-06 DIAGNOSIS — I509 Heart failure, unspecified: Secondary | ICD-10-CM | POA: Diagnosis not present

## 2021-09-06 DIAGNOSIS — Z79899 Other long term (current) drug therapy: Secondary | ICD-10-CM

## 2021-09-06 DIAGNOSIS — E538 Deficiency of other specified B group vitamins: Secondary | ICD-10-CM | POA: Diagnosis not present

## 2021-09-06 DIAGNOSIS — E870 Hyperosmolality and hypernatremia: Secondary | ICD-10-CM | POA: Diagnosis not present

## 2021-09-06 MED ORDER — TRAZODONE HCL 50 MG PO TABS
50.0000 mg | ORAL_TABLET | Freq: Every day | ORAL | Status: DC
  Administered 2021-09-06 – 2021-09-09 (×4): 50 mg via ORAL
  Filled 2021-09-06 (×8): qty 1

## 2021-09-06 MED ORDER — NICOTINE 14 MG/24HR TD PT24
14.0000 mg | MEDICATED_PATCH | Freq: Once | TRANSDERMAL | Status: AC
  Administered 2021-09-06: 14 mg via TRANSDERMAL
  Filled 2021-09-06 (×2): qty 1

## 2021-09-06 MED ORDER — ALUM & MAG HYDROXIDE-SIMETH 200-200-20 MG/5ML PO SUSP: 30.0000 mL | ORAL | Status: DC | PRN

## 2021-09-06 MED ORDER — FLUTICASONE PROPIONATE 50 MCG/ACT NA SUSP
1.0000 | Freq: Every day | NASAL | Status: DC
  Administered 2021-09-07 – 2021-11-09 (×61): 1 via NASAL
  Filled 2021-09-06 (×3): qty 16

## 2021-09-06 MED ORDER — TRAZODONE HCL 50 MG PO TABS: 50.0000 mg | ORAL_TABLET | Freq: Every day | ORAL | Status: DC

## 2021-09-06 MED ORDER — OLANZAPINE 5 MG PO TABS
5.0000 mg | ORAL_TABLET | Freq: Every day | ORAL | Status: DC
  Administered 2021-09-06 – 2021-09-08 (×3): 5 mg via ORAL
  Filled 2021-09-06 (×6): qty 1

## 2021-09-06 MED ORDER — MAGNESIUM HYDROXIDE 400 MG/5ML PO SUSP
30.0000 mL | Freq: Every day | ORAL | Status: DC | PRN
  Administered 2021-11-05 – 2021-11-14 (×3): 30 mL via ORAL
  Filled 2021-09-06 (×5): qty 30

## 2021-09-06 MED ORDER — HYDROXYZINE HCL 25 MG PO TABS
25.0000 mg | ORAL_TABLET | Freq: Three times a day (TID) | ORAL | Status: DC | PRN
  Administered 2021-09-10 – 2021-12-17 (×33): 25 mg via ORAL
  Filled 2021-09-06 (×40): qty 1

## 2021-09-06 NOTE — Consult Note (Signed)
Telepsych Consultation   Reason for Consult:  Psychiatric Reassessment  Referring Physician:  Blanchie Dessert, MD Location of Patient:   [facility] Location of Provider: Other: virtual home office  Patient Identification: Natalie Brown MRN:  [id number] Principal Diagnosis: <principal problem not specified> Diagnosis:  Active Problems:   Bipolar 1 disorder with moderate mania ([place])   Total Time spent with patient: 30 minutes  Subjective:   Natalie Brown is a 56 y.o. female patient admitted with mental decompensation in the setting of mounting psychosocial stressors and medication non-adherence.  HPI:  Patient seen via telepsych by this provider; chart reviewed and consulted with Dr. Dwyane Dee on 09/06/21.  On evaluation Natalie Brown reports, "there's something wrong with me in my head  and my heart and I have problems stating what's wrong." Patient is A&Ox4, based on admission assessment notes, she is more clear and not as confused as she was on yesterday.  However, she continues to present with disorganization of thoughts, which limits her ability to participate in the assessment.  At times pt offers tangential responses to questions.  She does collaborate most of what has already been obtained in the [facility] admission assessment. Endorses mounting psychosocial stressors and medication non-compliance as the reason for her current presentation.   Patient's agrees to allow her mother to be present for interview: [name], reports her daughter is not doing well and needs to be admitted.  She states pt is sleeping outside in the rain, is confused, has problems remaining on task and has intermittent irritability.    Past Psychiatric History: Bipolar 1 disorder  Risk to Self:  yes Risk to Others:  yes Prior Inpatient Therapy: yes  Prior Outpatient Therapy:  unknown  Past Medical History:  Past Medical History:  Diagnosis Date   Bipolar 1 disorder with moderate mania ([place])  12/30/2013   Hypertension    History reviewed. No pertinent surgical history. Family History: History reviewed. No pertinent family history. Family Psychiatric  History: unknown Social History:  Social History   Substance and Sexual Activity  Alcohol Use No     Social History   Substance and Sexual Activity  Drug Use No    Social History   Socioeconomic History   Marital status: Married    Spouse name: Not on file   Number of children: Not on file   Years of education: Not on file   Highest education level: Not on file  Occupational History   Not on file  Tobacco Use   Smoking status: Every Day   Smokeless tobacco: Current  Substance and Sexual Activity   Alcohol use: No   Drug use: No   Sexual activity: Not on file  Other Topics Concern   Not on file  Social History Narrative   Not on file   Social Determinants of Health   Financial Resource Strain: Not on file  Food Insecurity: Not on file  Transportation Needs: Not on file  Physical Activity: Not on file  Stress: Not on file  Social Connections: Not on file   Additional Social History:    Allergies:   Allergies  Allergen Reactions   Penicillins Itching    Labs:  Results for orders placed or performed during the hospital encounter of 09/05/21 (from the past 48 hour(s))  Comprehensive metabolic panel     Status: Abnormal   Collection Time: 09/05/21  1:27 PM  Result Value Ref Range   Sodium 140 135 - 145 mmol/L  Potassium 4.0 3.5 - 5.1 mmol/L   Chloride 108 98 - 111 mmol/L   CO2 25 22 - 32 mmol/L   Glucose, Bld 83 70 - 99 mg/dL    Comment: Glucose reference range applies only to samples taken after fasting for at least 8 hours.   BUN 11 6 - 20 mg/dL   Creatinine, Ser 1.10 (H) 0.44 - 1.00 mg/dL   Calcium 9.2 8.9 - 10.3 mg/dL   Total Protein 7.5 6.5 - 8.1 g/dL   Albumin 3.9 3.5 - 5.0 g/dL   AST 22 15 - 41 U/L   ALT 14 0 - 44 U/L   Alkaline Phosphatase 83 38 - 126 U/L   Total Bilirubin 0.7 0.3  - 1.2 mg/dL   GFR, Estimated 60 (L) >60 mL/min    Comment: (NOTE) Calculated using the CKD-EPI Creatinine Equation (2021)    Anion gap 7 5 - 15    Comment: Performed at [facility] [address] [place], [place] [address]  Ethanol     Status: None   Collection Time: 09/05/21  1:27 PM  Result Value Ref Range   Alcohol, Ethyl (B) <10 <10 mg/dL    Comment: (NOTE) Lowest detectable limit for serum alcohol is 10 mg/dL.  For medical purposes only. Performed at [facility], [place] [address], [place], [place] [address]   CBC with Diff     Status: None   Collection Time: 09/05/21  1:27 PM  Result Value Ref Range   WBC 5.7 4.0 - 10.5 K/uL   RBC 4.43 3.87 - 5.11 MIL/uL   Hemoglobin 13.7 12.0 - 15.0 g/dL   HCT 41.3 36.0 - 46.0 %   MCV 93.2 80.0 - 100.0 fL   MCH 30.9 26.0 - 34.0 pg   MCHC 33.2 30.0 - 36.0 g/dL   RDW 13.8 11.5 - 15.5 %   Platelets 300 150 - 400 K/uL   nRBC 0.0 0.0 - 0.2 %   Neutrophils Relative % 58 %   Neutro Abs 3.3 1.7 - 7.7 K/uL   Lymphocytes Relative 33 %   Lymphs Abs 1.9 0.7 - 4.0 K/uL   Monocytes Relative 4 %   Monocytes Absolute 0.2 0.1 - 1.0 K/uL   Eosinophils Relative 4 %   Eosinophils Absolute 0.2 0.0 - 0.5 K/uL   Basophils Relative 1 %   Basophils Absolute 0.0 0.0 - 0.1 K/uL   Immature Granulocytes 0 %   Abs Immature Granulocytes 0.01 0.00 - 0.07 K/uL    Comment: Performed at [facility], [address] [place], [place] [address]  Troponin I (High Sensitivity)     Status: None   Collection Time: 09/05/21  1:27 PM  Result Value Ref Range   Troponin I (High Sensitivity) 3 <18 ng/L    Comment: (NOTE) Elevated high sensitivity troponin I (hsTnI) values and significant  changes across serial measurements may suggest ACS but many other  chronic and acute conditions are known to elevate hsTnI results.  Refer to the "Links" section for chest pain algorithms and additional  guidance. Performed at [facility], [place] [address], [place], [place] [address]   I-Stat beta hCG blood, ED     Status: Abnormal   Collection Time: 09/05/21  1:34 PM  Result Value Ref Range   I-stat hCG, quantitative 6.4 (H) <5 mIU/mL   Comment 3            Comment:   GEST. AGE  CONC.  (mIU/mL)   <=1 WEEK        5 - 50     2 WEEKS       50 - 500     3 WEEKS       100 - 10,000     4 WEEKS     1,000 - 30,000        FEMALE AND NON-PREGNANT FEMALE:     LESS THAN 5 mIU/mL   Resp Panel by RT-PCR (Flu A&B, Covid) Nasopharyngeal Swab     Status: None   Collection Time: 09/05/21  4:18 PM   Specimen: Nasopharyngeal Swab; Nasopharyngeal(NP) swabs in vial transport medium  Result Value Ref Range   SARS Coronavirus 2 by RT PCR NEGATIVE NEGATIVE    Comment: (NOTE) SARS-CoV-2 target nucleic acids are NOT DETECTED.  The SARS-CoV-2 RNA is generally detectable in upper respiratory specimens during the acute phase of infection. The lowest concentration of SARS-CoV-2 viral copies this assay can detect is 138 copies/mL. A negative result does not preclude SARS-Cov-2 infection and should not be used as the sole basis for treatment or other patient management decisions. A negative result may occur with  improper specimen collection/handling, submission of specimen other than nasopharyngeal swab, presence of viral mutation(s) within the areas targeted by this assay, and inadequate number of viral copies(<138 copies/mL). A negative result must be combined with clinical observations, patient history, and epidemiological information. The expected result is Negative.  Fact Sheet for Patients:  EntrepreneurPulse.com.au  Fact Sheet for Healthcare Providers:  IncredibleEmployment.be  This test is no t yet approved or cleared by the [place] FDA and  has been authorized for detection and/or diagnosis of SARS-CoV-2 by FDA under an Emergency Use Authorization (EUA). This EUA will remain  in effect (meaning this  test can be used) for the duration of the COVID-19 declaration under Section 564(b)(1) of the Act, 21 U.S.C.section 360bbb-3(b)(1), unless the authorization is terminated  or revoked sooner.       Influenza A by PCR NEGATIVE NEGATIVE   Influenza B by PCR NEGATIVE NEGATIVE    Comment: (NOTE) The Xpert Xpress SARS-CoV-2/FLU/RSV plus assay is intended as an aid in the diagnosis of influenza from Nasopharyngeal swab specimens and should not be used as a sole basis for treatment. Nasal washings and aspirates are unacceptable for Xpert Xpress SARS-CoV-2/FLU/RSV testing.  Fact Sheet for Patients: EntrepreneurPulse.com.au  Fact Sheet for Healthcare Providers: IncredibleEmployment.be  This test is not yet approved or cleared by the [place] FDA and has been authorized for detection and/or diagnosis of SARS-CoV-2 by FDA under an Emergency Use Authorization (EUA). This EUA will remain in effect (meaning this test can be used) for the duration of the COVID-19 declaration under Section 564(b)(1) of the Act, 21 U.S.C. section 360bbb-3(b)(1), unless the authorization is terminated or revoked.  Performed at [facility], [place] [address], [place], [place] [address]   Urine rapid drug screen (hosp performed)     Status: None   Collection Time: 09/05/21  4:18 PM  Result Value Ref Range   Opiates NONE DETECTED NONE DETECTED   Cocaine NONE DETECTED NONE DETECTED   Benzodiazepines NONE DETECTED NONE DETECTED   Amphetamines NONE DETECTED NONE DETECTED   Tetrahydrocannabinol NONE DETECTED NONE DETECTED   Barbiturates NONE DETECTED NONE DETECTED    Comment: (NOTE) DRUG SCREEN FOR MEDICAL PURPOSES ONLY.  IF CONFIRMATION IS NEEDED FOR ANY PURPOSE, NOTIFY LAB WITHIN 5 DAYS.  LOWEST DETECTABLE LIMITS FOR URINE  DRUG SCREEN Drug Class                     Cutoff (ng/mL) Amphetamine and metabolites    1000 Barbiturate and metabolites     200 Benzodiazepine                 [id number] Tricyclics and metabolites     300 Opiates and metabolites        300 Cocaine and metabolites        300 THC                            50 Performed at [facility], [place] [address], [place], [place] [address]     Medications:  Current Facility-Administered Medications  Medication Dose Route Frequency Provider Last Rate Last Admin   hydrOXYzine (ATARAX) tablet 25 mg  25 mg Oral TID PRN Merlyn Lot E, NP   25 mg at 09/06/21 1755   nicotine (NICODERM CQ - dosed in mg/24 hours) patch 14 mg  14 mg Transdermal Once Horton, Kristie M, DO   14 mg at 09/05/21 1903   OLANZapine (ZYPREXA) tablet 5 mg  5 mg Oral QHS Merlyn Lot E, NP   5 mg at 09/05/21 2336   Current Outpatient Medications  Medication Sig Dispense Refill   CLARITIN 10 MG tablet Take 10 mg by mouth daily.     fluticasone (FLONASE) 50 MCG/ACT nasal spray Place 1 spray into both nostrils daily.     pseudoephedrine-guaifenesin (MUCINEX D) 60-600 MG 12 hr tablet Take 1 tablet by mouth every 12 (twelve) hours. 30 tablet 0    Musculoskeletal: pt seen moving all extremities without concerns Strength & Muscle Tone: within normal limits Gait & Station: normal Patient leans: N/A  Psychiatric Specialty Exam:  Presentation  General Appearance: Appropriate for Environment; Fairly Groomed  Eye Contact:Good  Speech:Clear and Coherent  Speech Volume:Normal  Handedness:Right   Mood and Affect  Mood:Anxious; Hopeless; Worthless  Affect:Congruent; Labile   Social worker Processes:Disorganized  Descriptions of Associations:Circumstantial  Orientation:Full (Time, Place and Person)  Thought Content:Illogical; Rumination  History of Schizophrenia/Schizoaffective disorder:No  Duration of Psychotic Symptoms:N/A  Hallucinations:Hallucinations: None  Ideas of Reference:None  Suicidal Thoughts:Suicidal Thoughts: No  Homicidal Thoughts:Homicidal Thoughts:  No   Sensorium  Memory:Immediate Good; Recent Fair; Remote Fair (patient does not endorse memory concerns but in the setting of bipolar disorder and medication non-adherence she presents with intermittent confusion)  Judgment:Impaired  Insight:Lacking   Executive Functions  Concentration:Poor  Attention Span:Poor  [place]  Language:Good   Psychomotor Activity  Psychomotor Activity:Psychomotor Activity: Normal   Assets  Assets:Communication Skills; Desire for Improvement; Social Support (states he mother is very supportive)   Sleep  Sleep:Sleep: Fair Number of Hours of Sleep: 6    Physical Exam: Physical Exam Constitutional:      Appearance: Normal appearance.  Musculoskeletal:        General: Normal range of motion.  Neurological:     General: No focal deficit present.     Mental Status: [name] oriented to person, place, and time.  Psychiatric:        Attention and Perception: Perception normal. [name] inattentive.        Mood and Affect: Mood is anxious and depressed.        Speech: Speech is tangential.        Behavior: Behavior is cooperative.  Cognition and Memory: Cognition is impaired. Memory is impaired.        Judgment: Judgment is impulsive.   Review of Systems  Constitutional: Negative.   HENT: Negative.    Eyes: Negative.   Respiratory: Negative.    Cardiovascular: Negative.   Gastrointestinal: Negative.   Genitourinary: Negative.   Skin: Negative.   Neurological: Negative.   Endo/Heme/Allergies: Negative.   Blood pressure 107/76, pulse 64, temperature 98 F (36.7 C), temperature source Oral, resp. rate 18, height 5\' 6"  (1.676 m), weight 72.6 kg, SpO2 100 %. Body mass index is 25.82 kg/m.  Treatment Plan Summary: Patient with Bipolar 1 disorder, presents mentally decompensated, thoughts are disorganized and offers tangential responses. Patient has not been adherent with psychotropic medications, is  homeless and endorses mounting stressors,and suicidal ideations.    Daily contact with patient to assess and evaluate symptoms and progress in treatment and Medication management. -UDS is negative -CMP with elevated creatinine 1.10 (range 0.44-1.0) in reviewing previous labs 06/21/20 appears this is chronic, remains WNL to -Continue psychiatric medications.  -Urine pregnancy test is 6.4, will ask that be repeated.  -QT/QTC 366/430, no prolongation  Plan to restart medications: Olanzapine 5mg  po daily for mood/bipolar disorder Trazodone 50mg  po qhs Hydroxyzine 25mg  po TID prn anxiety  Disposition: Recommend psychiatric Inpatient admission when medically cleared.  This service was provided via telemedicine using a 2-way, interactive audio and video technology.  Names of all persons participating in this telemedicine service and their role in this encounter. Name: [place] Degnan. Arsenio Loader Role: Patient   Name: Merlyn Lot Role: [place], NP 09/06/2021 6:49 PM

## 2021-09-06 NOTE — ED Provider Notes (Signed)
Emergency Medicine Observation Re-evaluation Note  Natalie Brown is a 56 y.o. female, seen on rounds today.  Pt initially presented to the ED for complaints of Suicidal Currently, the patient is medically cleared and awaiting psychiatry reevaluation  Physical Exam  BP 100/68 (BP Location: Left Arm)   Pulse 63   Temp 97.6 F (36.4 C) (Oral)   Resp 15   Ht 1.676 m (5\' 6" )   Wt 72.6 kg   SpO2 100%   BMI 25.82 kg/m  Physical Exam General: nad Cardiac: Heart rate recorded is normal blood pressure normal Lungs: Respiratory rate is normal with normal oxygen saturations Psych: Patient is resting  ED Course / MDM  EKG:EKG Interpretation  Date/Time:  Wednesday Sep 05 2021 23:20:50 EDT Ventricular Rate:  83 PR Interval:  132 QRS Duration: 66 QT Interval:  366 QTC Calculation: 430 R Axis:   49 Text Interpretation: Sinus rhythm with Premature supraventricular complexes Otherwise normal ECG No previous ECGs available Confirmed by 05-21[birth year]1980 [phone]) on 09/05/2021 11:39:16 PM  I have reviewed the labs performed to date as well as medications administered while in observation.  Recent changes in the last 24 hours include none.  Plan  Current plan is for psych to advise.  Natalie Brown is not under involuntary commitment.     Natalie Putnam, MD 09/06/21 1228

## 2021-09-06 NOTE — Discharge Instructions (Signed)
Transport to [facility] at 1900.

## 2021-09-06 NOTE — Progress Notes (Signed)
Pt was accepted to [facility] 09/06/2021 at 2130; Bed Assignment 400-1  Pt meets inpatient criteria per Ophelia Shoulder, NP  Attending Physician will be MD Phineas Inches  Report can be called to: Adult unit: [phone]  Pt can arrive after 2130  Pt must sign voluntary consent and fax that back to [phone] , original to accompany at transport.  Care Team notified: Rennie Plowman, RN, [facility] Malva Limes, RN, Ophelia Shoulder, NP, Alfredia Client, [place], Ophelia Shoulder, NP.  Kelton Pillar, LCSWA 09/06/2021 @ 6:08 PM

## 2021-09-06 NOTE — ED Provider Notes (Signed)
[place] [facility] team indicates to move patient to [facility] for further [facility] evaluation.   Pt alert, content, vitals normal.  Pt currently appears stable for transport to [facility].     Lajean Saver, MD 09/06/21 1714

## 2021-09-06 NOTE — ED Notes (Signed)
Voluntary consent signed

## 2021-09-06 NOTE — ED Notes (Signed)
Pt continues to states 'im just not sure whats going on, no has talked to me about going to another facility" pt informed of POC, pt seems anxious.

## 2021-09-06 NOTE — ED Notes (Signed)
Refused vitals before transport.

## 2021-09-06 NOTE — ED Notes (Signed)
Pt ambulatory to restroom to change under garments at this time.

## 2021-09-06 NOTE — ED Notes (Addendum)
Rn called to give report. [facility] stated to call after 7pm to give report. Will notify next shift

## 2021-09-06 NOTE — ED Notes (Signed)
TSS speaking with pt, mother at bedside

## 2021-09-06 NOTE — Tx Team (Signed)
Initial Treatment Plan 09/06/2021 10:31 PM Natalie Brown [id number]    PATIENT STRESSORS: Health problems   Medication change or noncompliance     PATIENT STRENGTHS: Capable of independent living  Communication skills  Motivation for treatment/growth  Supportive family/friends    PATIENT IDENTIFIED PROBLEMS: Suicidal Ideation  "I woke for the [place] I woke here in medical records"                   DISCHARGE CRITERIA:  Improved stabilization in mood, thinking, and/or behavior Motivation to continue treatment in a less acute level of care Verbal commitment to aftercare and medication compliance  PRELIMINARY DISCHARGE PLAN: Outpatient therapy Placement in alternative living arrangements  PATIENT/FAMILY INVOLVEMENT: This treatment plan has been presented to and reviewed with the patient, Natalie Brown, and/or family member, .  The patient and family have been given the opportunity to ask questions and make suggestions.  Margarita Rana, RN 09/06/2021, 10:31 PM

## 2021-09-07 ENCOUNTER — Encounter (HOSPITAL_COMMUNITY): Payer: Self-pay | Admitting: Psychiatry

## 2021-09-07 ENCOUNTER — Other Ambulatory Visit: Payer: Self-pay

## 2021-09-07 ENCOUNTER — Encounter (HOSPITAL_COMMUNITY): Payer: Self-pay

## 2021-09-07 DIAGNOSIS — F3112 Bipolar disorder, current episode manic without psychotic features, moderate: Secondary | ICD-10-CM | POA: Diagnosis not present

## 2021-09-07 LAB — PREGNANCY, URINE: Preg Test, Ur: NEGATIVE

## 2021-09-07 MED ORDER — OXCARBAZEPINE 300 MG PO TABS
300.0000 mg | ORAL_TABLET | Freq: Two times a day (BID) | ORAL | Status: DC
  Administered 2021-09-07 – 2021-09-12 (×10): 300 mg via ORAL
  Filled 2021-09-07 (×15): qty 1

## 2021-09-07 NOTE — Group Note (Signed)
LCSW Group Therapy Note   Group Date: 09/07/2021 Start Time: 1300 End Time: 1400  Type of Therapy and Topic:  Group Therapy:  Strengths Exploration   Participation Level: Active  Description of Group: This group allows individuals to explore their strengths, learn to use strengths in new ways to improve well-being. Strengths-based interventions involve identifying strengths, understanding how they are used, and learning new ways to apply them. Individuals will identify their strengths, and then explore their roles in different areas of life (relationships, professional life, and personal fulfillment). Individuals will think about ways in which they currently use their strengths, along with new ways they could begin using them.    Therapeutic Goals Patient will verbalize two of their strengths Patient will identify how their strengths are currently used Patient will identify two new ways to apply their strengths  Patients will create a plan to apply their strengths in their daily lives     Summary of Patient Progress:  The Pt attended group and remained there the entire time.  The Pt accepted the worksheets that were provided and discussed the topic openly with their peers.  The Pt was able to acknowledge and discuss strengths that they have and strengths from other people in their life as well.       Therapeutic Modalities Cognitive Behavioral Therapy Motivational Interviewing  Darleen Crocker, [place] 09/07/2021  1:47 PM

## 2021-09-07 NOTE — BH IP Treatment Plan (Signed)
Interdisciplinary Treatment and Diagnostic Plan Update  09/07/2021 Time of Session: 9:40am Natalie Brown MRN: [id number]  Principal Diagnosis: Bipolar 1 disorder with moderate mania ([place])  Secondary Diagnoses: Principal Problem:   Bipolar 1 disorder with moderate mania ([facility])   Current Medications:  Current Facility-Administered Medications  Medication Dose Route Frequency Provider Last Rate Last Admin   alum & mag hydroxide-simeth (MAALOX/MYLANTA) 200-200-20 MG/5ML suspension 30 mL  30 mL Oral Q4H PRN Massengill, [name], MD       fluticasone (FLONASE) 50 MCG/ACT nasal spray 1 spray  1 spray Each Nare Daily Massengill, [name], MD   1 spray at 09/07/21 0841   hydrOXYzine (ATARAX) tablet 25 mg  25 mg Oral TID PRN Massengill, [name], MD       magnesium hydroxide (MILK OF MAGNESIA) suspension 30 mL  30 mL Oral Daily PRN Massengill, [name], MD       nicotine (NICODERM CQ - dosed in mg/24 hours) patch 14 mg  14 mg Transdermal Once Massengill, [name], MD   14 mg at 09/06/21 2300   OLANZapine (ZYPREXA) tablet 5 mg  5 mg Oral QHS Massengill, [name], MD   5 mg at 09/06/21 2309   traZODone (DESYREL) tablet 50 mg  50 mg Oral QHS Massengill, [name], MD   50 mg at 09/06/21 2310   PTA Medications: Medications Prior to Admission  Medication Sig Dispense Refill Last Dose   CLARITIN 10 MG tablet Take 10 mg by mouth daily.      fluticasone (FLONASE) 50 MCG/ACT nasal spray Place 1 spray into both nostrils daily.      pseudoephedrine-guaifenesin (MUCINEX D) 60-600 MG 12 hr tablet Take 1 tablet by mouth every 12 (twelve) hours. 30 tablet 0     Patient Stressors: Health problems   Medication change or noncompliance    Patient Strengths: Capable of independent living  Hydrographic surveyor for treatment/growth  Supportive family/friends   Treatment Modalities: Medication Management, Group therapy, Case management,  1 to 1 session with clinician, Psychoeducation, Recreational  therapy.   Physician Treatment Plan for Primary Diagnosis: Bipolar 1 disorder with moderate mania ([place]) Long Term Goal(s): Improvement in symptoms so as ready for discharge   Short Term Goals: Ability to identify and develop effective coping behaviors will improve Ability to maintain clinical measurements within normal limits will improve Compliance with prescribed medications will improve Ability to identify changes in lifestyle to reduce recurrence of condition will improve Ability to verbalize feelings will improve Ability to disclose and discuss suicidal ideas Ability to demonstrate self-control will improve  Medication Management: Evaluate patient's response, side effects, and tolerance of medication regimen.  Therapeutic Interventions: 1 to 1 sessions, Unit Group sessions and Medication administration.  Evaluation of Outcomes: Not Met  Physician Treatment Plan for Secondary Diagnosis: Principal Problem:   Bipolar 1 disorder with moderate mania ([place])  Long Term Goal(s): Improvement in symptoms so as ready for discharge   Short Term Goals: Ability to identify and develop effective coping behaviors will improve Ability to maintain clinical measurements within normal limits will improve Compliance with prescribed medications will improve Ability to identify changes in lifestyle to reduce recurrence of condition will improve Ability to verbalize feelings will improve Ability to disclose and discuss suicidal ideas Ability to demonstrate self-control will improve     Medication Management: Evaluate patient's response, side effects, and tolerance of medication regimen.  Therapeutic Interventions: 1 to 1 sessions, Unit Group sessions and Medication administration.  Evaluation of Outcomes: Not Met  RN Treatment Plan for Primary Diagnosis: Bipolar 1 disorder with moderate mania ([place]) Long Term Goal(s): Knowledge of disease and therapeutic regimen to maintain health will  improve  Short Term Goals: Ability to remain free from injury will improve, Ability to verbalize frustration and anger appropriately will improve, Ability to demonstrate self-control, Ability to participate in decision making will improve, Ability to verbalize feelings will improve, Ability to disclose and discuss suicidal ideas, Ability to identify and develop effective coping behaviors will improve, and Compliance with prescribed medications will improve  Medication Management: RN will administer medications as ordered by provider, will assess and evaluate patient's response and provide education to patient for prescribed medication. RN will report any adverse and/or side effects to prescribing provider.  Therapeutic Interventions: 1 on 1 counseling sessions, Psychoeducation, Medication administration, Evaluate responses to treatment, Monitor vital signs and CBGs as ordered, Perform/monitor CIWA, COWS, AIMS and Fall Risk screenings as ordered, Perform wound care treatments as ordered.  Evaluation of Outcomes: Progressing   LCSW Treatment Plan for Primary Diagnosis: Bipolar 1 disorder with moderate mania ([place]) Long Term Goal(s): Safe transition to appropriate next level of care at discharge, Engage patient in therapeutic group addressing interpersonal concerns.  Short Term Goals: Engage patient in aftercare planning with referrals and resources, Increase social support, Increase ability to appropriately verbalize feelings, Increase emotional regulation, Facilitate acceptance of mental health diagnosis and concerns, Facilitate patient progression through stages of change regarding substance use diagnoses and concerns, Identify triggers associated with mental health/substance abuse issues, and Increase skills for wellness and recovery  Therapeutic Interventions: Assess for all discharge needs, 1 to 1 time with Social worker, Explore available resources and support systems, Assess for adequacy in  community support network, Educate family and significant other(s) on suicide prevention, Complete Psychosocial Assessment, Interpersonal group therapy.  Evaluation of Outcomes: Progressing   Progress in Treatment: Attending groups: Yes. Participating in groups: Yes. Taking medication as prescribed: Yes. Toleration medication: Yes. Family/Significant other contact made: No, will contact:  CSW will assess social supports and identify supports to safety plan with  Patient understands diagnosis: No. Discussing patient identified problems/goals with staff: Yes. Medical problems stabilized or resolved: Yes. Denies suicidal/homicidal ideation: Yes. Issues/concerns per patient self-inventory: No. Other: none  New problem(s) identified: No, Describe:  none reported   New Short Term/Long Term Goal(s):   medication stabilization, elimination of SI thoughts, development of comprehensive mental wellness plan.    Patient Goals:  Patient states that "I would like to understand my thoughts and brain more as well as locate a safe place for me to go"  Discharge Plan or Barriers: Patient recently admitted. CSW will continue to follow and assess for appropriate referrals and possible discharge planning.    Reason for Continuation of Hospitalization: Anxiety Depression Mania Suicidal ideation  Estimated Length of Stay: 3-5 days  Last 3 [place] Suicide Severity Risk Score: [place] Admission (Current) from 09/06/2021 in [facility] ED from 09/05/2021 in [facility] from 05/26/2020 in [facility] High Risk High Risk No Risk       Last PHQ 2/9 Scores:    05/26/2020    2:14 PM 09/03/2017    4:34 PM 08/07/2016    2:12 PM  Depression screen PHQ 2/9  Decreased Interest 3 3 3   Down, Depressed, Hopeless 3 1 2   PHQ - 2 Score 6 4 5   Altered sleeping 0 3 3  Tired,  decreased energy 0 3 0  Change in appetite 3 3 0  Feeling bad or failure about yourself  0 3 1  Trouble concentrating 3 0 0  Moving slowly or fidgety/restless 3 0 1  Suicidal thoughts 0 0 0  PHQ-9 Score 15 16 10     Scribe for Treatment Team: Zachery Conch, [place] 09/07/2021 10:28 AM
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
## 2021-09-07 NOTE — BHH Group Notes (Signed)
PT was informed but did not attend group. ?
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
## 2021-09-07 NOTE — Progress Notes (Signed)
Patient is ambulating around the unit. Denies any SI/HI, denies anxiety/depression at present, and denies any A/V hallucinations. When talking to patient, it is hard to get a straight story about why she is here. She jumps around from subject to subject and often looks off in the distance when she is talking. Took her medications this morning with no problems. States she slept well. Will continue to monitor q50min and provide positive support.

## 2021-09-07 NOTE — Progress Notes (Signed)
   09/07/21 2103  Psych Admission Type (Psych Patients Only)  Admission Status Voluntary  Psychosocial Assessment  Patient Complaints Anxiety  Eye Contact Fair  Facial Expression Anxious  Affect Appropriate to circumstance  Speech Logical/coherent  Interaction Assertive  Motor Activity Restless  Appearance/Hygiene In hospital gown  Behavior Characteristics Appropriate to situation  Mood Anxious  Thought Process  Coherency Disorganized  Content Blaming others  Delusions None reported or observed  Perception UTA  Hallucination None reported or observed  Judgment Impaired  Confusion Mild  Danger to Self  Current suicidal ideation? Denies  Agreement Not to Harm Self Yes  Description of Agreement verbal  Danger to Others  Danger to Others None reported or observed

## 2021-09-07 NOTE — Group Note (Signed)
Recreation Therapy Group Note   Group Topic:Stress Management  Group Date: 09/07/2021 Start Time: 0935 End Time: 0950 Facilitators: Victorino Sparrow, [place] Location: [address]   Goal Area(s) Addresses:  Patient will identify positive stress management techniques. Patient will identify benefits of using stress management post d/c.  Group Description:  Meditation.  LRT played a meditation that focused on being resilient in the face of resistance and let down.  Patients were to listen and follow along to fully engage in the meditation as it played.  Patents were encouraged to look for sources to use for stress management such as Apps, Youtube, scripts, etc.    Affect/Mood: Appropriate   Participation Level: Engaged   Participation Quality: Independent   Behavior: Appropriate   Speech/Thought Process: Focused   Insight: Good   Judgement: Good   Modes of Intervention: Meditation   Patient Response to Interventions:  Engaged   Education Outcome:  Acknowledges education and In group clarification offered    Clinical Observations/Individualized Feedback: Pt attended and participated in group session.    Plan: Continue to engage patient in RT group sessions 2-3x/week.   Victorino Sparrow, LRT,CTRS 09/07/2021 11:32 AM
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
## 2021-09-08 DIAGNOSIS — F3112 Bipolar disorder, current episode manic without psychotic features, moderate: Secondary | ICD-10-CM | POA: Diagnosis not present

## 2021-09-08 LAB — LIPID PANEL
Cholesterol: 173 mg/dL (ref 0–200)
HDL: 50 mg/dL (ref >40)
LDL Cholesterol: 101 mg/dL — ABNORMAL HIGH (ref 0–99)
Total CHOL/HDL Ratio: 3.5 RATIO
Triglycerides: 110 mg/dL (ref <150)
VLDL: 22 mg/dL (ref 0–40)

## 2021-09-08 LAB — TSH: TSH: 1.719 u[IU]/mL (ref 0.350–4.500)

## 2021-09-08 LAB — HEMOGLOBIN A1C
Hgb A1c MFr Bld: 5.3 % (ref 4.8–5.6)
Mean Plasma Glucose: 105.41 mg/dL

## 2021-09-08 NOTE — Progress Notes (Signed)
Pt is A&OX4, calm, remains a poor historian, disorganized thoughts, loose association, limited judgement, denies suicidal ideations, denies homicidal ideations, denies auditory hallucinations and denies visual hallucinations. Pt verbally agrees to approach staff if these become apparent and before harming self or others. Pt denies experiencing nightmares. Mood and affect are congruent. Pt appetite is ok. No complaints of anxiety, distress, pain and/or discomfort at this time. Pt's memory appears to be grossly intact, and Pt hasn't displayed any injurious behaviors. Pt is medication compliant. There's no evidence of suicidal intent. Psychomotor activity was WNL. No s/s of Parkinson, Dystonia, Akathisia and/or Tardive Dyskinesia noted.

## 2021-09-08 NOTE — Progress Notes (Signed)
   09/08/21 2314  Psych Admission Type (Psych Patients Only)  Admission Status Voluntary  Psychosocial Assessment  Patient Complaints Anxiety  Eye Contact Fair  Facial Expression Anxious;Worried  Affect Apprehensive;Preoccupied  Actuary Activity Other (Comment) (WDL)  Appearance/Hygiene In scrubs  Behavior Characteristics Appropriate to situation  Mood Apprehensive  Thought Process  Coherency Disorganized;Loose associations  Content Blaming others  Delusions None reported or observed  Perception WDL  Hallucination None reported or observed  Judgment Impaired  Confusion Mild  Danger to Self  Current suicidal ideation? Denies  Agreement Not to Harm Self Yes  Description of Agreement verbal  Danger to Others  Danger to Others None reported or observed
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
## 2021-09-08 NOTE — [facility] Group Notes (Signed)
.  Psychoeducational Group Note    Date:  5/20//23 Time: 1300-1400    Purpose of Group: . The group focus' on teaching patients on how to identify their needs and their Life Skills:  A group where two lists are made. What people need and what are things that we do that are unhealthy. The lists are developed by the patients and it is explained that we often do the actions that are not healthy to get our list of needs met.  Goal:: to develop the coping skills needed to get their needs met  Participation Level:  Active  Participation Quality:  Appropriate  Affect:  Appropriate  Cognitive:  Oriented  Insight:  Improving  Engagement in Group:  Engaged  Additional Comments: Rates her energy at a 10/10. Participated fully in the group.  Paulino Rily

## 2021-09-08 NOTE — [facility] Group Notes (Signed)
[facility] Group Notes:  (Nursing/MHT/Case Management/Adjunct)  Date:  09/08/2021  Time:  9:02 PM  Type of Therapy:   wrap up  Participation Level:  Active  Participation Quality:  Appropriate and Attentive  Affect:  Appropriate  Cognitive:  Appropriate  Insight:  Appropriate, Good, and Improving  Engagement in Group:  Developing/Improving  Modes of Intervention:  Discussion  Summary of Progress/Problems: PT enjoyed being outside today. She also said she was able to speak with her mom and sister and that was positive for her. Maxine Glenn 09/08/2021, 9:02 PM

## 2021-09-08 NOTE — Group Note (Signed)
LCSW Group Therapy Note 09/08/2021  11:15am-12:00pm  Type of Therapy and Topic:  Group Therapy: Anger and Commonalities  Participation Level:  Active   Description of Group: In this group, patients initially shared an "unknown" fact about themselves and CSW led a discussion about the ways in which we have things in common without realizing it.  Patient then identified a recent time they became angry and how this yet again showed a way in which they had something in common with other patients.  We discussed possible unhealthy reactions to anger and possible healthy reactions.  We also discussed possible underlying emotions that lead to the anger.  Commonalities among group members were pointed out throughout the entirety of group.  Therapeutic Goals: Patients were asked to share something about themselves and learned that they often have things in common with other people without knowing this Patients will remember an incident of anger and how they reacted Patients will be able to identify their reaction as healthy or unhealthy, and identify possible reactions that would have been the opposite Patients will learn that anger itself is a secondary emotion and will think about their primary emotion at the time of their last incident of anger  Summary of Patient Progress:  The patient shared that something interesting she could share about herself is that her favorite colors are purple and red.  This was expressed as also present in several other patients, so the patient was able to recognize that she is not alone.  The patient was not able to stay focused on the group topic, but she did listen and make comments.  Therapeutic Modalities:   Cognitive Behavioral Therapy  Maretta Los, LCSW 09/08/2021  1:33 PM

## 2021-09-08 NOTE — Group Note (Signed)
Date:  09/08/2021 Time:  10:01 AM  Group Topic/Focus:  Orientation:   The focus of this group is to educate the patient on the purpose and policies of crisis stabilization and provide a format to answer questions about their admission.  The group details unit policies and expectations of patients while admitted.    Participation Level:  Active  Participation Quality:  Appropriate  Affect:  Appropriate  Cognitive:  Appropriate  Insight: Appropriate  Engagement in Group:  Engaged  Modes of Intervention:  Discussion  Additional Comments:    Jaquita Rector 09/08/2021, 10:01 AM
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
## 2021-09-08 NOTE — [facility] Group Notes (Signed)
Psychoeducational Group Note  Date: 09/08/2021 Time: 0900-1000    Goal Setting   Purpose of Group: This group helps to provide patients with the steps of setting Brown goal that is specific, measurable, attainable, realistic and time specific. Brown discussion on how we keep ourselves stuck with negative self talk. Homework given for Patients to write 30 positive attributes about themselves.    Participation Level:  Active  Participation Quality:  Appropriate  Affect:  Appropriate  Cognitive:  Appropriate  Insight:  Improving  Engagement in Group:  Engaged  Additional Comments:  [employer] at Brown 7/10. Participated in the group.  Natalie Brown 

## 2021-09-09 DIAGNOSIS — F3112 Bipolar disorder, current episode manic without psychotic features, moderate: Secondary | ICD-10-CM | POA: Diagnosis not present

## 2021-09-09 MED ORDER — WHITE PETROLATUM EX OINT
TOPICAL_OINTMENT | CUTANEOUS | Status: AC
  Filled 2021-09-09: qty 5

## 2021-09-09 MED ORDER — OLANZAPINE 7.5 MG PO TABS
7.5000 mg | ORAL_TABLET | Freq: Every day | ORAL | Status: DC
  Administered 2021-09-09: 7.5 mg via ORAL
  Filled 2021-09-09 (×3): qty 1

## 2021-09-09 NOTE — Progress Notes (Signed)
Psychoeducational Group Note  Date:  09/09/2021 Time:  2015  Group Topic/Focus:  Wrap up group  Participation Level: Did Not Attend  Participation Quality:  Not Applicable  Affect:  Not Applicable  Cognitive:  Not Applicable  Insight:  Not Applicable  Engagement in Group: Not Applicable  Additional Comments:  Did not attend.   Marcille Buffy 09/09/2021, 9:34 PM

## 2021-09-09 NOTE — [facility] Group Notes (Signed)
.  Psychoeducational Group Note    Date:  5/21//23 Time: 1300-1400    Purpose of Group: . The group focus' on teaching patients on how to identify their needs and their Life Skills:  A group where two lists are made. What people need and what are things that we do that are unhealthy. The lists are developed by the patients and it is explained that we often do the actions that are not healthy to get our list of needs met.  Goal:: to develop the coping skills needed to get their needs met  Participation Level: did not attend  Ravonda Brecheen A  

## 2021-09-09 NOTE — Progress Notes (Signed)
   09/09/21 2302  Psych Admission Type (Psych Patients Only)  Admission Status Voluntary  Psychosocial Assessment  Patient Complaints Anxiety  Eye Contact Fair  Facial Expression Anxious  Affect Appropriate to circumstance  Speech Pressured  Interaction Assertive  Motor Activity Other (Comment) (WDL)  Appearance/Hygiene In hospital gown  Behavior Characteristics Appropriate to situation  Mood Anxious;Pleasant  Thought Process  Coherency Disorganized  Content Religiosity  Delusions None reported or observed  Perception WDL  Hallucination None reported or observed  Judgment Impaired  Confusion Mild  Danger to Self  Current suicidal ideation? Denies  Agreement Not to Harm Self Yes  Description of Agreement verbal  Danger to Others  Danger to Others None reported or observed
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
## 2021-09-09 NOTE — [facility] Group Notes (Signed)
Adult Psychoeducational Group Not Date:  09/09/2021 Time:  9675-9163 Group Topic/Focus: PROGRESSIVE RELAXATION. A group where deep breathing is taught and tensing and relaxation muscle groups is used. Imagery is used as well.  Pts are asked to imagine 3 pillars that hold them up when they are not able to hold themselves up and to share that with the group.  Participation Level:  Active  Participation Quality:  Appropriate  Affect:  Appropriate  Cognitive:  Oriented Pt Insight: Improving  Engagement in Group:  Engaged  Modes of Intervention:  Activity, Discussion, Education, and Support  Additional Comments:  Pt rate her energy at a 10/10. States what holds her up is herself and her mother.  Dione Housekeeper

## 2021-09-10 DIAGNOSIS — F3112 Bipolar disorder, current episode manic without psychotic features, moderate: Secondary | ICD-10-CM | POA: Diagnosis not present

## 2021-09-10 MED ORDER — OLANZAPINE 5 MG PO TABS: 5.0000 mg | ORAL_TABLET | Freq: Every day | ORAL | Status: DC

## 2021-09-10 MED ORDER — OLANZAPINE 7.5 MG PO TABS
7.5000 mg | ORAL_TABLET | Freq: Every day | ORAL | Status: DC
  Filled 2021-09-10 (×2): qty 1

## 2021-09-10 MED ORDER — OLANZAPINE 7.5 MG PO TABS
7.5000 mg | ORAL_TABLET | Freq: Every day | ORAL | Status: DC
  Administered 2021-09-10: 7.5 mg via ORAL
  Filled 2021-09-10 (×3): qty 1

## 2021-09-10 NOTE — H&P (Signed)
Spiritual care group on grief and loss facilitated by chaplain Janne Napoleon, [facility]   Group Goal:   Support / Education around grief and loss   Members engage in facilitated group support and psycho-social education.   Group Description:   Following introductions and group rules, group members engaged in facilitated group dialog and support around topic of loss, with particular support around experiences of loss in their lives. Group Identified types of loss (relationships / self / things) and identified patterns, circumstances, and changes that precipitate losses. Reflected on thoughts / feelings around loss, normalized grief responses, and recognized variety in grief experience. Group noted Worden's four tasks of grief in discussion.   Group drew on Adlerian / Rogerian, narrative, MI,   Patient Progress: Aleasa attended group and actively engaged and participated in conversation.  At times her comments were tangential and other times, on topic and insightful.  She appeared very confused at the beginning, but seemed to become more clear as the group went on.  She requested an individual follow-up.  Chaplain will attempt follow up tomorrow.  [address] [place] Pager, [phone]

## 2021-09-10 NOTE — Progress Notes (Addendum)
D. Pt is pleasant upon approach, but continues to present as somewhat disorganized, tangential and delusional. 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 Pt currently denies SI/HI and AVH and does not appear to be responding to internal stimuli. .  A. Labs and vitals monitored. Pt compliant with meds Pt supported emotionally and encouraged to express concerns and ask questions.   R. Pt remains safe with 15 minute checks. Will continue POC.

## 2021-09-10 NOTE — Progress Notes (Signed)
Patient did not attend morning orientation group.  
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
## 2021-09-10 NOTE — Group Note (Signed)
Occupational Therapy Group Note  Group Topic:Communication  Group Date: 09/10/2021 Start Time: 1400 End Time: 1500 Facilitators: Ted Mcalpine, OT   Group Description: Group encouraged increased engagement and participation through discussion focused on communication styles. Patients were educated on the different styles of communication including passive, aggressive, assertive, and passive-aggressive communication. Group members shared and reflected on which styles they most often find themselves communicating in and brainstormed strategies on how to transition and practice a more assertive approach. Further discussion explored how to use assertiveness skills and strategies to further advocate and ask questions as it relates to their treatment plan and mental health.   Therapeutic Goal(s): Identify practical strategies to improve communication skills  Identify how to use assertive communication skills to address individual needs and wants   Participation Level: Active   Participation Quality: Independent   Behavior: Appropriate   Speech/Thought Process: Coherent and Directed   Affect/Mood: Appropriate   Insight: Fair   Judgement: Fair   Individualization: Pt was active / engaged in their participation of group discussion/activity. New skills were identified  Modes of Intervention: Discussion and Education  Patient Response to Interventions:  Attentive, Engaged, and Interested    Plan: Continue to engage patient in OT groups 2 - 3x/week.  09/10/2021  Ted Mcalpine, OT Kerrin Champagne, OT

## 2021-09-10 NOTE — [facility] Group Notes (Addendum)
Pt did not attend AA group  

## 2021-09-10 NOTE — Progress Notes (Signed)
Writer witnessed pt walking out of the dayroom and attempting to hold on to a staff member as if they were going to fall. Writer proceeded to walk behind both staff member and pt. Pt began to lean backward appearing to lose their balance. Writer approached pt from behind to stop pt's fall. Both Clinical research associate and other staff member assisted pt to their bed. Writer alerted their Nurse and took their vitals (141/81). Pt was given Gatorade and voiced that they felt better and that they believe that they got up from a laying to a standing position too quickly.

## 2021-09-10 NOTE — Group Note (Signed)
LCSW Group Therapy Note   Group Date: 09/10/2021 Start Time: 1300 End Time: 1400  Type of Therapy and Topic:  Group Therapy: Thoughts, Feelings, and Actions  Participation Level:  Active   Description of Group:   In this group, each patient discussed their previous experiencing and understanding of overthinking, identifying the harmful impact on their lives. As a group, each patient was introduced to the basic concepts of Cognitive Behavioral Therapy: that thoughts, feelings, and actions are all connected and influence one another. They were given examples of how overthinking can affect our feelings, actions, and vise versa. The group was then asked to analyze how overthinking was harmful and brainstorm alternative thinking patterns/reactions to the example situation. Then, each group member filled out and identified their own example situation in which a problem situation caused their thoughts, feelings, and actions to be negatively impacted; they were asked to come up with 3 new (more adaptive/positive) thoughts that led to 3 new feelings and actions.  Therapeutic Goals: Patients will review and discuss their past experience with overthinking. Patients will learn the basics of the CBT model through group-led examples.. Patients will identify situations where they may have negative thoughts, feelings, or actions and will then reframe the situation using more positive thoughts to react differently.  Summary of Patient Progress:  The patient shared that they felt guilty today. Patient contributed to the discussion of how thoughts, feelings, and actions interact, noting when they may have experienced a negative thought pattern and recognized it as harmful. They were attentive when other patients shared their experiences, and worked to reframe their own thoughts in an activity to identify future situations where they may typically overthink.  Therapeutic Modalities:   Cognitive Behavioral  Therapy Mindfulness  Aram Beecham, [place] 09/10/2021  2:08 PM

## 2021-09-10 NOTE — Group Note (Signed)
Recreation Therapy Group Note   Group Topic:Stress Management  Group Date: 09/10/2021 Start Time: 0940 End Time: 0953 Facilitators: Caroll Rancher, LRT,CTRS Location: [address] Dayroom   Goal Area(s) Addresses:  Patient will actively participate in stress management techniques presented during session.  Patient will successfully identify benefit of practicing stress management post d/c.   Group Description: Guided Imagery. LRT provided education, instruction, and demonstration on practice of visualization via guided imagery. Patient was asked to participate in the technique introduced during session. LRT debriefed including topics of mindfulness, stress management and specific scenarios each patient could use these techniques. Patients were given suggestions of ways to access scripts post d/c and encouraged to explore Youtube and other apps available on smartphones, tablets, and computers.   Affect/Mood: N/A   Participation Level: Did not attend    Clinical Observations/Individualized Feedback:     Plan: Continue to engage patient in RT group sessions 2-3x/week.   Caroll Rancher, LRT,CTRS  09/10/2021 11:23 AM

## 2021-09-11 DIAGNOSIS — F3112 Bipolar disorder, current episode manic without psychotic features, moderate: Secondary | ICD-10-CM | POA: Diagnosis not present

## 2021-09-11 MED ORDER — DOCUSATE SODIUM 100 MG PO CAPS
100.0000 mg | ORAL_CAPSULE | Freq: Every day | ORAL | Status: DC
  Administered 2021-09-11 – 2021-10-12 (×32): 100 mg via ORAL
  Filled 2021-09-11 (×39): qty 1

## 2021-09-11 MED ORDER — WHITE PETROLATUM EX OINT
TOPICAL_OINTMENT | CUTANEOUS | Status: AC
  Administered 2021-09-11: 2
  Filled 2021-09-11: qty 10

## 2021-09-11 MED ORDER — OLANZAPINE 10 MG PO TABS
10.0000 mg | ORAL_TABLET | Freq: Every day | ORAL | Status: DC
  Administered 2021-09-12 – 2021-09-13 (×2): 10 mg via ORAL
  Filled 2021-09-11 (×7): qty 1

## 2021-09-11 NOTE — Progress Notes (Signed)
   09/11/21 1100  Psych Admission Type (Psych Patients Only)  Admission Status Voluntary  Psychosocial Assessment  Patient Complaints Anxiety  Eye Contact Fair  Facial Expression Blank  Affect Appropriate to circumstance  Speech Pressured  Interaction Intrusive;Assertive  Motor Activity Other (Comment) (WNL)  Appearance/Hygiene Unremarkable  Behavior Characteristics Cooperative  Mood Preoccupied;Anxious  Thought Process  Coherency Tangential;Disorganized  Content Preoccupation  Delusions Paranoid  Perception WDL  Hallucination None reported or observed  Judgment Poor  Confusion Mild  Danger to Self  Current suicidal ideation? Denies  Agreement Not to Harm Self Yes  Description of Agreement verbal  Danger to Others  Danger to Others None reported or observed

## 2021-09-11 NOTE — Plan of Care (Signed)
  Problem: Activity: Goal: Interest or engagement in activities will improve Outcome: Progressing   Problem: Education: Goal: Emotional status will improve Outcome: Not Progressing Goal: Mental status will improve Outcome: Not Progressing   

## 2021-09-11 NOTE — [facility] Group Notes (Signed)
Adult Orientation Group Note  Date:  09/11/2021 Time:  11:06 AM  Group Topic/Focus:  Orientation:   The focus of this group is to educate the patient on the purpose and policies of crisis stabilization and provide a format to answer questions about their admission.  The group details unit policies and expectations of patients while admitted.  Participation Level:  Active  Participation Quality:  Appropriate  Affect:  Appropriate  Cognitive:  Appropriate  Insight: Appropriate  Engagement in Group:  Engaged  Modes of Intervention:  Education   Thomas Hoff 09/11/2021, 11:06 AM

## 2021-09-11 NOTE — Group Note (Signed)
Recreation Therapy Group Note   Group Topic:Animal Assisted Therapy   Group Date: 09/11/2021 Start Time: 1430 End Time: 1515 Facilitators: Caroll Rancher, LRT,CTRS Location: [address] Dayroom   Animal-Assisted Activity (AAA) Program Checklist/Progress Note Patient Eligibility Criteria Checklist & Daily Group note for Rec Tx Intervention   AAA/T Program Assumption of Risk Form signed by Patient/ or Parent Legal Guardian YES  Patient understands their participation is voluntary YES  Group Description: Patients provided opportunity to interact with trained and credentialed [employer] [facility] dog and the community volunteer/dog handler. Patients practiced appropriate animal interaction and were educated on dog safety outside of the hospital in common community settings. Patients were allowed to use dog toys and other items to practice commands, engage the dog in play, and/or complete routine aspects of animal care.   Education: Charity fundraiser, Health visitor, Communication & Social Skills    Affect/Mood: N/A   Participation Level: Did not attend    Clinical Observations/Individualized Feedback:     Plan: Continue to engage patient in RT group sessions 2-3x/week.   Caroll Rancher, Antonietta Jewel 09/11/2021 3:48 PM

## 2021-09-11 NOTE — Progress Notes (Signed)
   09/10/21 2300  Psych Admission Type (Psych Patients Only)  Admission Status Voluntary  Psychosocial Assessment  Patient Complaints Anxiety  Eye Contact Fair  Facial Expression Anxious  Affect Appropriate to circumstance  Speech Pressured  Interaction Assertive  Motor Activity Other (Comment) (WDL)  Appearance/Hygiene Unremarkable  Behavior Characteristics Cooperative;Appropriate to situation  Mood Pleasant;Anxious  Thought Process  Coherency Disorganized;Tangential  Content Preoccupation  Delusions Paranoid  Perception WDL  Hallucination None reported or observed  Judgment Poor  Confusion Mild  Danger to Self  Current suicidal ideation? Denies  Agreement Not to Harm Self Yes  Description of Agreement verbal  Danger to Others  Danger to Others None reported or observed

## 2021-09-11 NOTE — Progress Notes (Signed)
[facility] MD Progress Note  09/11/2021 7:50 AM Natalie Brown  MRN:  [id number]  Chief Complaint: disorganization, delusions  Reason for Admission:  Natalie Brown is a 56 y.o. female with a history of bipolar d/o, who was initially admitted for inpatient psychiatric hospitalization on 09/06/2021 for management of delusions and disorganized thinking. The patient is currently on Hospital Day 5.   Chart Review from last 24 hours:  The patient's chart was reviewed and nursing notes were reviewed. The patient's case was discussed in multidisciplinary team meeting. Per nursing, patient had an episode yesterday where she appeared as if she may fall but did not. She appeared disorganized, delusional, and tangential on the unit but did attend select groups. Per MAR she was compliant with scheduled medications and did receive Vistaril X1 for anxiety.  Information Obtained Today During Patient Interview: The patient was seen and evaluated on the unit. [name] disorganized on exam and required frequent interruption and redirection making interview difficult. On assessment today the patient reports that she is not sure how she ended up in the hospital. She goes into rambling story about showering at the [facility] and trying to get help at [facility] prior to admission. She states she is homeless and cannot live with her mother who is an Corporate treasurer apartment. She denies paranoia, AVH, or first rank symptoms and would not comment when asked about ideas of reference. 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 She shows me papers she has written on with illogical notes and things not written in [place]. She states "I speak [place], [place], [place], [place], [place], [place] and [place]." She goes on to say she is getting her Masters Degree to be "a doctor." She denies feeling manic or depressed and does not know her past psychiatric history. She thinks she  previously was on trials of Rexulti, Lexapro and Trintellix but cannot give more details. She denies SI or HI and voices no physical complaints today other than constipation. She denies medication side-effects. I attempted to have conversation with her regarding her medications and she could not engage in a meaningful discussion of r/b/se/a. I advised that her creatinine was mildly elevated and I would like to recheck BMP and encouraged fluid hydration. I also discussed the need for her to have a beta HCG rechecked and she is resistant for additional labs. She agreed for me to contact her mother for collateral with verbal permission given.   Collateral Natalie Brown [phone]) mother: Ms Dan Humphreys states the patient has had mental health issues for "years" and frequently has delusion that she is from [place]. Mother states she thinks she has been treated for Bipolar I and depression in the past but does not know previous med trials. She states the patient has not consistently taken meds or had f/u mental health appointments in the community. She confirms that the patient's report of speaking multiple languages and having multiple licenses/degrees are delusions. She states the patient has not worked in 10 years. She cannot live with her since her mother is in an elder apartment with strict visitation policy with potential for eviction is she allows anyone to stay with her more than 2 weeks.   Chart Review: Patient was seen at [facility] and approximately 70 hours at [facility] in February 2022 for bipolar mania with reported delusions. She was stabilized on Zyprexa and discharged.  Principal Problem: Bipolar 1 disorder with moderate mania (HCC) Diagnosis:  Principal Problem:   Bipolar 1 disorder with moderate mania (HCC)  Total Time Spent in Direct Patient Care:  I personally spent 40 minutes on the unit in direct patient care. The direct patient care time included face-to-face time with the patient, reviewing the  patient's chart, communicating with other professionals, and coordinating care. Greater than 50% of this time was spent in counseling or coordinating care with the patient regarding goals of hospitalization, psycho-education, and discharge planning needs.   Past Psychiatric History: see H&P  Past Medical History:  Past Medical History:  Diagnosis Date   Bipolar 1 disorder with moderate mania (HCC) 12/30/2013   Hypertension    Family History: see H&P  Family Psychiatric  History: see H&P  Social History:  Social History   Substance and Sexual Activity  Alcohol Use No     Social History   Substance and Sexual Activity  Drug Use No    Social History   Socioeconomic History   Marital status: Married    Spouse name: Not on file   Number of children: Not on file   Years of education: Not on file   Highest education level: Not on file  Occupational History   Not on file  Tobacco Use   Smoking status: Every Day   Smokeless tobacco: Current  Substance and Sexual Activity   Alcohol use: No   Drug use: No   Sexual activity: Not on file  Other Topics Concern   Not on file  Social History Narrative   Not on file   Social Determinants of Health   Financial Resource Strain: Not on file  Food Insecurity: Not on file  Transportation Needs: Not on file  Physical Activity: Not on file  Stress: Not on file  Social Connections: Not on file    Sleep: Fair  Appetite:  Fair  Current Medications: Current Facility-Administered Medications  Medication Dose Route Frequency Provider Last Rate Last Admin   alum & mag hydroxide-simeth (MAALOX/MYLANTA) 200-200-20 MG/5ML suspension 30 mL  30 mL Oral Q4H PRN Massengill, Nathan, MD       fluticasone (FLONASE) 50 MCG/ACT nasal spray 1 spray  1 spray Each Nare Daily Massengill, Nathan, MD   1 spray at 09/10/21 0732   hydrOXYzine (ATARAX) tablet 25 mg  25 mg Oral TID PRN Phineas Inches, MD   25 mg at 09/10/21 1204   magnesium  hydroxide (MILK OF MAGNESIA) suspension 30 mL  30 mL Oral Daily PRN Massengill, Harrold Donath, MD       OLANZapine (ZYPREXA) tablet 7.5 mg  7.5 mg Oral Q2000 Massengill, Nathan, MD   7.5 mg at 09/10/21 2128   Oxcarbazepine (TRILEPTAL) tablet 300 mg  300 mg Oral BID Armandina Stammer I, NP   300 mg at 09/10/21 1800    Lab Results: No results found for this or any previous visit (from the past 48 hour(s)).  Blood Alcohol level:  Lab Results  Component Value Date   ETH <10 09/05/2021    Metabolic Disorder Labs: Lab Results  Component Value Date   HGBA1C 5.3 09/08/2021   MPG 105.41 09/08/2021   No results found for: PROLACTIN Lab Results  Component Value Date   CHOL 173 09/08/2021   TRIG 110 09/08/2021   HDL 50 09/08/2021   CHOLHDL 3.5 09/08/2021   VLDL 22 09/08/2021   LDLCALC 101 (H) 09/08/2021   LDLCALC 102 (H) 06/21/2020    Physical Findings:  Musculoskeletal: Strength & Muscle Tone: within normal limits Gait & Station:  normal Patient leans: N/A  Psychiatric Specialty Exam:  Presentation  General Appearance: casually dressed, fair hygiene  Eye Contact:Fair  Speech: rambling, rapid but interruptible  Speech Volume: Normal  Mood and Affect  Mood:Mildly elevated, anxious appearing  Affect:Labile  Thought Process  Thought Processes:Disorganized, tangential  Orientation: Oriented to self, month, year and city but not situation  Thought Content:Has grandiose delusions that she speaks multiple languages and has multiple degrees, has illogical statements written on paper she gives to examiner; denies AVH, first rank symptoms or paranoia and is not grossly responding to internal/external stimuli on exam; will not comment about ideas of reference; denies SI or HI  Hallucinations: Denied  Ideas of Reference:uncooperative for questioning  Suicidal Thoughts: denied  Homicidal Thoughts:denied  Sensorium  Memory:Limited secondary to  mania/psychosis  Judgment:Impaired  Insight: Poor  Art therapistxecutive Functions  Concentration:Poor  Attention Span:Poor  Recall:Poor  Fund of Knowledge:Limited secondary to psychosis/mania  Language:Fair   Psychomotor Activity  Psychomotor Activity:Fidgety on exam; no akathisias or tremors noted  Assets  Assets:Communication Skills; Desire for Improvement; Physical Health   Sleep  5 hours   Physical Exam: Physical Exam Vitals and nursing note reviewed.  Constitutional:      Appearance: Normal appearance.  HENT:     Head: Normocephalic.  Pulmonary:     Effort: Pulmonary effort is normal.  Neurological:     General: No focal deficit present.     Mental Status: [name] alert.   Review of Systems  Respiratory:  Negative for shortness of breath.   Cardiovascular:  Negative for chest pain.  Gastrointestinal:  Positive for constipation. Negative for diarrhea, nausea and vomiting.  Neurological:  Negative for headaches.  Blood pressure 109/90, pulse (!) 111, temperature 98.6 F (37 C), temperature source Oral, resp. rate 16, height 5\' 6"  (1.676 m), weight 72.6 kg, SpO2 99 %. Body mass index is 25.83 kg/m.   Treatment Plan Summary: Diagnoses / Active Problems: Bipolar I MRE manic with psychotic features  PLAN: Safety and Monitoring:  -- Voluntary admission to inpatient psychiatric unit for safety, stabilization and treatment  -- Daily contact with patient to assess and evaluate symptoms and progress in treatment  -- Patient's case to be discussed in multi-disciplinary team meeting  -- Observation Level : q15 minute checks  -- Vital signs:  q12 hours  -- Precautions: suicide, elopement, and assault  2. Psychiatric Diagnoses and Treatment:   Bipolar I MRE manic with psychotic features  -- Increase Zyprexa to 10mg  qhs for mania/psychosis (attempted to discuss r/b/se/a to med and she could not engage in meaningful discussion but agrees to continue med)  -- Will continue  Trileptal 300mg  bid until we can recheck BHCG and UPT - would ideally like to transition to VPA for mania management  -- Metabolic profile and EKG monitoring obtained while on an atypical antipsychotic (Lipid Panel: WNL except for LDL 101; HbgA1c: 5.3; QTc:48011ms )   -- Encouraged patient to participate in unit milieu and in scheduled group therapies     3. Medical Issues Being Addressed:   Mildly elevated creatinine  -- repeat BMP pending and po hydration encouraged   Elevated BhCG with negative urine pregnancy test  -- I talked to on-call GYN provider who recommends repeating Beta HCG for trending and I will also repeat UPT - if beta HCG still elevated and UPT negative will need outpatient w/u  4. Discharge Planning:   -- Social work and case management to assist with discharge planning and identification of  hospital follow-up needs prior to discharge  -- Estimated LOS: 5-7 days  -- Discharge Concerns: Need to establish a safety plan; Medication compliance and effectiveness  -- Discharge Goals: Return home with outpatient referrals for mental health follow-up including medication management/psychotherapy  Comer Locket, MD, FAPA 09/11/2021, 7:50 AM

## 2021-09-11 NOTE — Plan of Care (Signed)
  Problem: Coping: Goal: Ability to demonstrate self-control will improve Outcome: Progressing   Problem: Safety: Goal: Periods of time without injury will increase Outcome: Progressing   Problem: Medication: Goal: Compliance with prescribed medication regimen will improve Outcome: Progressing   

## 2021-09-11 NOTE — Progress Notes (Signed)
Psychoeducational Group Note  Date:  09/11/2021 Time:  2059  Group Topic/Focus:  Wrap-Up Group:   The focus of this group is to help patients review their daily goal of treatment and discuss progress on daily workbooks.  Participation Level: Did Not Attend  Participation Quality:  Not Applicable  Affect:  Not Applicable  Cognitive:  Not Applicable  Insight:  Not Applicable  Engagement in Group: Not Applicable  Additional Comments:  The patient did not attend group this evening.   Hazle Coca S 09/11/2021, 8:59 PM
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
## 2021-09-12 ENCOUNTER — Encounter (HOSPITAL_COMMUNITY): Payer: Self-pay

## 2021-09-12 DIAGNOSIS — F3112 Bipolar disorder, current episode manic without psychotic features, moderate: Secondary | ICD-10-CM | POA: Diagnosis not present

## 2021-09-12 DIAGNOSIS — F312 Bipolar disorder, current episode manic severe with psychotic features: Secondary | ICD-10-CM | POA: Diagnosis present

## 2021-09-12 LAB — BASIC METABOLIC PANEL
Anion gap: 5 (ref 5–15)
BUN: 12 mg/dL (ref 6–20)
CO2: 25 mmol/L (ref 22–32)
Calcium: 8.4 mg/dL — ABNORMAL LOW (ref 8.9–10.3)
Chloride: 109 mmol/L (ref 98–111)
Creatinine, Ser: 1.01 mg/dL — ABNORMAL HIGH (ref 0.44–1.00)
GFR, Estimated: >60 mL/min (ref >60)
Glucose, Bld: 124 mg/dL — ABNORMAL HIGH (ref 70–99)
Potassium: 3.9 mmol/L (ref 3.5–5.1)
Sodium: 139 mmol/L (ref 135–145)

## 2021-09-12 LAB — HCG, QUANTITATIVE, PREGNANCY: hCG, Beta Chain, Quant, S: 5 m[IU]/mL — ABNORMAL HIGH (ref <5)

## 2021-09-12 LAB — PREGNANCY, URINE: Preg Test, Ur: NEGATIVE

## 2021-09-12 MED ORDER — LORAZEPAM 1 MG PO TABS
1.0000 mg | ORAL_TABLET | Freq: Two times a day (BID) | ORAL | Status: DC
  Administered 2021-09-12 – 2021-09-17 (×10): 1 mg via ORAL
  Filled 2021-09-12 (×10): qty 1

## 2021-09-12 MED ORDER — BENZTROPINE MESYLATE 1 MG PO TABS
1.0000 mg | ORAL_TABLET | Freq: Two times a day (BID) | ORAL | Status: DC | PRN
  Administered 2021-09-14: 1 mg via ORAL
  Filled 2021-09-12: qty 1

## 2021-09-12 MED ORDER — BENZTROPINE MESYLATE 1 MG/ML IJ SOLN: 1.0000 mg | Freq: Two times a day (BID) | INTRAMUSCULAR | Status: DC | PRN

## 2021-09-12 MED ORDER — DIVALPROEX SODIUM ER 500 MG PO TB24
1000.0000 mg | ORAL_TABLET | Freq: Every day | ORAL | Status: DC
  Administered 2021-09-12 – 2021-09-16 (×3): 1000 mg via ORAL
  Filled 2021-09-12 (×6): qty 2

## 2021-09-12 MED ORDER — LORAZEPAM 1 MG PO TABS
1.0000 mg | ORAL_TABLET | ORAL | Status: AC | PRN
  Administered 2021-09-14: 1 mg via ORAL
  Filled 2021-09-12: qty 1

## 2021-09-12 MED ORDER — ZIPRASIDONE MESYLATE 20 MG IM SOLR
20.0000 mg | INTRAMUSCULAR | Status: DC | PRN
  Filled 2021-09-12: qty 20

## 2021-09-12 MED ORDER — OLANZAPINE 5 MG PO TABS: 5.0000 mg | ORAL_TABLET | Freq: Every day | ORAL | Status: DC

## 2021-09-12 MED ORDER — OLANZAPINE 5 MG PO TBDP
5.0000 mg | ORAL_TABLET | Freq: Three times a day (TID) | ORAL | Status: DC | PRN
  Administered 2021-09-14 – 2021-12-21 (×3): 5 mg via ORAL
  Filled 2021-09-12 (×3): qty 1
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
## 2021-09-12 NOTE — Progress Notes (Signed)
Adult Psychoeducational Group Note  Date:  09/12/2021 Time:  8:55 PM  Group Topic/Focus:  Wrap-Up Group:   The focus of this group is to help patients review their daily goal of treatment and discuss progress on daily workbooks.  Participation Level:  Active  Participation Quality:  Appropriate  Affect:  Anxious and Not Congruent  Cognitive:  Disorganized  Insight: Limited  Engagement in Group:  Engaged  Modes of Intervention:  Discussion  Additional Comments:   Pt was engaged but highly tangential during group discussion. Pt states that she had a good day and rated it at a 10/10. Pt states that she is confused about her moving halls and states that she feels anxious about being here. Pt had visitation with her mother earlier and she has communicated with her doctor and SW today. Pt seems focused on how she will afford housing and groceries  amongst other things and was directed to talk to her SW about these concerns. Pt has to be constantly redirected during conversation due to the tangential nature of her conversations.   Natalie Brown 09/12/2021, 8:55 PM

## 2021-09-12 NOTE — Progress Notes (Addendum)
[facility] MD Progress Note  09/12/2021 11:51 AM Natalie Brown  MRN:  [id number]  Chief Complaint: disorganization, delusions  Reason for Admission:  Natalie Brown is a 56 y.o. female with a history of bipolar d/o, who was initially admitted for inpatient psychiatric hospitalization on 09/06/2021 for management of delusions and disorganized thinking. The patient is currently on Hospital Day 6.   Chart Review from last 24 hours:  The patient's chart was reviewed and nursing notes were reviewed. The patient's case was discussed in multidisciplinary team meeting. Per nursing, she was pressured in her speech, disorganized, and paranoid. She attended some groups. She reportedly had an episode of emesis last night. Per MAR she was compliant with scheduled medications except she did not receive scheduled Zyprexa due to being asleep. She did not receive PRN meds.  Information Obtained Today During Patient Interview: The patient was seen and evaluated on the unit. Initially this morning on interview she was paranoid and argumentative about getting her lab drawn. She was suspicious as to why blood was needed and referenced not getting money to give blood like she does as an outpatient. She was later interviewed after lab and states she speaks multiple languages and attempts to demonstrate to this examiner speaking in various accents and trying to speak in [place]. She remains hyper-verbal and more difficult to interrupt and redirect today. She states she "finished at [employer] and is working on her Master's degree. She continues to reference belief that she has "6 licenses for nutrition and dietician" and then rambles about previous work with [place] where she was commuting but not having her gas reimbursed by her employer. 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 She denies AVH, SI, HI, ideas of reference  or first rank symptoms. She denies SI or HI. She states her sleep is increased and her appetite is fair. She states she is eating salads and fruits due to being vegan. When questioned about emesis she states she drank Gatorade too fast and got nauseated but this has resolved. She voices no physical complaints today. Time was spent discussing the plan to discontinue her Trileptal and transition her to VPA given her ongoing disorganization, intrusiveness on the unit, and mania. I discussed the need for ongoing lab monitoring while on VPA and she agrees to repeat labs in 3 days. She could not engage in a meaningful conversation about her medications.   Principal Problem: Bipolar 1 disorder with moderate mania (HCC) Diagnosis: Active Problems:   Bipolar affective disorder, current episode manic with psychotic symptoms ([place])  Total Time Spent in Direct Patient Care:  I personally spent 30 minutes on the unit in direct patient care. The direct patient care time included face-to-face time with the patient, reviewing the patient's chart, communicating with other professionals, and coordinating care. Greater than 50% of this time was spent in counseling or coordinating care with the patient regarding goals of hospitalization, psycho-education, and discharge planning needs.  Past Psychiatric History: see H&P  Past Medical History:  Past Medical History:  Diagnosis Date   Bipolar 1 disorder with moderate mania ([place]) 12/30/2013   Hypertension    Family History: see H&P  Family Psychiatric  History: see H&P  Social History:  Social History   Substance and Sexual Activity  Alcohol Use No     Social History   Substance and Sexual Activity  Drug Use No  Social History   Socioeconomic History   Marital status: Married    Spouse name: Not on file   Number of children: Not on file   Years of education: Not on file   Highest education level: Not on file  Occupational History   Not on file   Tobacco Use   Smoking status: Every Day   Smokeless tobacco: Current  Substance and Sexual Activity   Alcohol use: No   Drug use: No   Sexual activity: Not on file  Other Topics Concern   Not on file  Social History Narrative   Not on file   Social Determinants of Health   Financial Resource Strain: Not on file  Food Insecurity: Not on file  Transportation Needs: Not on file  Physical Activity: Not on file  Stress: Not on file  Social Connections: Not on file    Sleep: Good  Appetite:  Fair  Current Medications: Current Facility-Administered Medications  Medication Dose Route Frequency Provider Last Rate Last Admin   alum & mag hydroxide-simeth (MAALOX/MYLANTA) 200-200-20 MG/5ML suspension 30 mL  30 mL Oral Q4H PRN Massengill, Harrold Donath, MD       benztropine (COGENTIN) tablet 1 mg  1 mg Oral BID PRN Comer Locket, MD       benztropine mesylate (COGENTIN) injection 1 mg  1 mg Intramuscular BID PRN Comer Locket, MD       divalproex (DEPAKOTE ER) 24 hr tablet 1,000 mg  1,000 mg Oral QHS Mason Jim, Giovonni Poirier E, MD       docusate sodium (COLACE) capsule 100 mg  100 mg Oral Daily Mason Jim, Auri Jahnke E, MD   100 mg at 09/12/21 0825   fluticasone (FLONASE) 50 MCG/ACT nasal spray 1 spray  1 spray Each Nare Daily Massengill, Harrold Donath, MD   1 spray at 09/12/21 0825   hydrOXYzine (ATARAX) tablet 25 mg  25 mg Oral TID PRN Phineas Inches, MD   25 mg at 09/10/21 1204   OLANZapine zydis (ZYPREXA) disintegrating tablet 5 mg  5 mg Oral Q8H PRN Comer Locket, MD       And   LORazepam (ATIVAN) tablet 1 mg  1 mg Oral PRN Comer Locket, MD       And   ziprasidone (GEODON) injection 20 mg  20 mg Intramuscular PRN Mason Jim, Dayvon Dax E, MD       LORazepam (ATIVAN) tablet 1 mg  1 mg Oral BID Mason Jim, Jagger Beahm E, MD       magnesium hydroxide (MILK OF MAGNESIA) suspension 30 mL  30 mL Oral Daily PRN Massengill, Harrold Donath, MD       OLANZapine (ZYPREXA) tablet 10 mg  10 mg Oral Q2000 Mason Jim, Jaielle Dlouhy E, MD        OLANZapine (ZYPREXA) tablet 5 mg  5 mg Oral Daily Comer Locket, MD        Lab Results:  Results for orders placed or performed during the hospital encounter of 09/06/21 (from the past 48 hour(s))  Basic metabolic panel     Status: Abnormal   Collection Time: 09/12/21  6:39 AM  Result Value Ref Range   Sodium 139 135 - 145 mmol/L   Potassium 3.9 3.5 - 5.1 mmol/L   Chloride 109 98 - 111 mmol/L   CO2 25 22 - 32 mmol/L   Glucose, Bld 124 (H) 70 - 99 mg/dL    Comment: Glucose reference range applies only to samples taken after fasting for at least 8 hours.  BUN 12 6 - 20 mg/dL   Creatinine, Ser 1.01 (H) 0.44 - 1.00 mg/dL   Calcium 8.4 (L) 8.9 - 10.3 mg/dL   GFR, Estimated >60 >60 mL/min    Comment: (NOTE) Calculated using the CKD-EPI Creatinine Equation (2021)    Anion gap 5 5 - 15    Comment: Performed at [facility], [place] [address] [place], [place] [address]  hCG, quantitative, pregnancy     Status: Abnormal   Collection Time: 09/12/21  6:39 AM  Result Value Ref Range   hCG, Beta Chain, Quant, S 5 (H) <5 mIU/mL    Comment:          GEST. AGE      CONC.  (mIU/mL)   <=1 WEEK        5 - 50     2 WEEKS       50 - 500     3 WEEKS       100 - 10,000     4 WEEKS     1,000 - 30,000     5 WEEKS     3,500 - 115,000   6-8 WEEKS     12,000 - 270,000    12 WEEKS     15,000 - 220,000        FEMALE AND NON-PREGNANT FEMALE:     LESS THAN 5 mIU/mL Performed at [facility], [place] [address], [place], [place] [address]   Pregnancy, urine     Status: None   Collection Time: 09/12/21  6:39 AM  Result Value Ref Range   Preg Test, Ur NEGATIVE NEGATIVE    Comment:        THE SENSITIVITY OF THIS METHODOLOGY IS >20 mIU/mL. Performed at [facility], [place] [address] [place], [place] [address]     Blood Alcohol level:  Lab Results  Component Value Date   ETH <10 [id number]    Metabolic Disorder Labs: Lab Results  Component  Value Date   HGBA1C 5.3 09/08/2021   MPG 105.41 09/08/2021   No results found for: PROLACTIN Lab Results  Component Value Date   CHOL 173 09/08/2021   TRIG 110 09/08/2021   HDL 50 09/08/2021   CHOLHDL 3.5 09/08/2021   VLDL 22 09/08/2021   LDLCALC 101 (H) 09/08/2021   LDLCALC 102 (H) 06/21/2020    Physical Findings:  Musculoskeletal: Strength & Muscle Tone: within normal limits Gait & Station: normal Patient leans: N/A  Psychiatric Specialty Exam:  Presentation  General Appearance: casually dressed, fair hygiene, wearing hair bonnet  Eye Contact:Fair  Speech: rambling, more pressured today  Speech Volume: increased at times  Mood and Affect  Mood: Elevated, labile  Affect:Labile  Thought Process  Thought Processes:Disorganized and ruminative about labs and discharge  Orientation: Oriented to self, month, year and city but not situation  Thought Content:Has ongoing grandiose delusions, paranoia about labs, and at times tries to speak in foreign languages; denies AVH, first rank symptoms or ideas of reference and is not grossly responding to internal/external stimuli on exam  Hallucinations: Denied  Ideas of Reference:Denied  Suicidal Thoughts: denied  Homicidal Thoughts:denied  Sensorium  Memory:Limited secondary to mania/psychosis  Judgment:Impaired  Insight: Poor  Community education officer  Concentration:Poor  Attention Span:Poor  [place] secondary to psychosis/mania  Language:Fair   Psychomotor Activity  Psychomotor Activity:Normal - less animated and less fidgety today  Assets  Assets:Communication Skills; Desire for Improvement; Physical Health  Sleep  7.25 hours   Physical Exam Vitals and nursing note reviewed.  Constitutional:      Appearance: Normal appearance.  HENT:     Head: Normocephalic.  Pulmonary:     Effort: Pulmonary effort is normal.  Neurological:     General: No focal deficit  present.     Mental Status: [name] alert.   Review of Systems  Constitutional:  Negative for fever.  Respiratory:  Negative for shortness of breath.   Cardiovascular:  Negative for chest pain.  Gastrointestinal:  Negative for constipation, diarrhea, nausea and vomiting.  Neurological:  Negative for headaches.  Blood pressure 126/82, pulse 97, temperature 98.3 F (36.8 C), temperature source Oral, resp. rate 16, height 5\' 6"  (1.676 m), weight 72.6 kg, SpO2 99 %. Body mass index is 25.83 kg/m.   Treatment Plan Summary: Diagnoses / Active Problems: Bipolar I MRE manic severe with psychotic features  PLAN: Safety and Monitoring:  -- Convert to IVC admission today since staff states patient is requesting information about 72 hour discharge form (first opinion and affidavit completed and faxed to magistrate 09/12/21)  -- Daily contact with patient to assess and evaluate symptoms and progress in treatment  -- Patient's case to be discussed in multi-disciplinary team meeting  -- Observation Level : q15 minute checks  -- Vital signs:  q12 hours  -- Precautions: suicide, elopement, and assault  2. Psychiatric Diagnoses and Treatment:   Bipolar I MRE manic with psychotic features  --Change Zyprexa 10mg  to 2000 for mania/psychosis to avoid missing med while sleeping  -- D/c trileptal and start VPA ER 1000mg  qhs for mania (advised she will need CBC, LFT and VPA level monitoring as well as weight monitoring and birth control while on this med - will need more meaningful discussion of psychoeducation about med as she clears; admission AST 22 and ALT 14 and admission CBC WNL - will repeat LFT, VPA level and CBC in 3-4 days once has been on med)  -- Start Ativan 1mg  bid for help with intrusive behaviors/mania  -- Move to [id number] hall  -- Metabolic profile and EKG monitoring obtained while on an atypical antipsychotic (Lipid Panel: WNL except for LDL 101; HbgA1c: 5.3; QTc:484ms )   -- Encouraged patient  to participate in unit milieu and in scheduled group therapies     3. Medical Issues Being Addressed:   Mildly elevated creatinine - improving  -- repeat BMP shows creatinine down to 1.01 and po hydration encouraged   Elevated BhCG with negative urine pregnancy test  -- I talked to on-call GYN provider again today and discussed that repeat beta HCG is 5 and 2nd repeat UPT is negative- Gyn states this is post-menopausal change and we can proceed with start of VPA  4. Discharge Planning:   -- Social work and case management to assist with discharge planning and identification of hospital follow-up needs prior to discharge  -- Discharge Concerns: Need to establish a safety plan; Medication compliance and effectiveness  -- Discharge Goals: Return home with outpatient referrals for mental health follow-up including medication management/psychotherapy  Harlow Asa, MD, FAPA 09/12/2021, 11:51 AM

## 2021-09-12 NOTE — BHH Group Notes (Signed)
The focus of this group is to help patients establish daily goals to achieve during treatment and discuss how the patient can incorporate goal setting into their daily lives to aide in recovery.  Pt attended group and stated that her goal is "Be around people , follow directions.

## 2021-09-12 NOTE — Progress Notes (Signed)
Pt disorganized at times with loose associations, but has been pleasant on the unit this evening.    09/12/21 2100  Psych Admission Type (Psych Patients Only)  Admission Status Involuntary  Psychosocial Assessment  Patient Complaints Anxiety  Eye Contact Fair  Facial Expression Anxious;Animated  Affect Labile;Preoccupied  Speech Pressured  Interaction Intrusive;Cautious  Motor Activity Restless  Appearance/Hygiene Unremarkable  Behavior Characteristics Fidgety  Mood Anxious;Depressed  Aggressive Behavior  Effect No apparent injury  Thought Process  Coherency Disorganized;Flight of ideas;Loose associations  Content Preoccupation;Blaming others  Delusions Paranoid  Perception WDL  Hallucination None reported or observed  Judgment WDL  Confusion Mild  Danger to Self  Current suicidal ideation? Denies  Danger to Others  Danger to Others None reported or observed

## 2021-09-12 NOTE — BH IP Treatment Plan (Signed)
Interdisciplinary Treatment and Diagnostic Plan Update  09/12/2021 Time of Session: 9:50am  Natalie Brown MRN: [id number]  Principal Diagnosis: Bipolar 1 disorder with moderate mania (HCC)  Secondary Diagnoses: Principal Problem:   Bipolar 1 disorder with moderate mania (HCC)   Current Medications:  Current Facility-Administered Medications  Medication Dose Route Frequency Provider Last Rate Last Admin   alum & mag hydroxide-simeth (MAALOX/MYLANTA) 200-200-20 MG/5ML suspension 30 mL  30 mL Oral Q4H PRN Massengill, Nathan, MD       docusate sodium (COLACE) capsule 100 mg  100 mg Oral Daily Mason Jim, Amy E, MD   100 mg at 09/12/21 0825   fluticasone (FLONASE) 50 MCG/ACT nasal spray 1 spray  1 spray Each Nare Daily Massengill, Nathan, MD   1 spray at 09/12/21 0825   hydrOXYzine (ATARAX) tablet 25 mg  25 mg Oral TID PRN Phineas Inches, MD   25 mg at 09/10/21 1204   magnesium hydroxide (MILK OF MAGNESIA) suspension 30 mL  30 mL Oral Daily PRN Massengill, Harrold Donath, MD       OLANZapine (ZYPREXA) tablet 10 mg  10 mg Oral Q2000 Mason Jim, Amy E, MD       Oxcarbazepine (TRILEPTAL) tablet 300 mg  300 mg Oral BID Armandina Stammer I, NP   300 mg at 09/12/21 0825   PTA Medications: Medications Prior to Admission  Medication Sig Dispense Refill Last Dose   CLARITIN 10 MG tablet Take 10 mg by mouth daily.      fluticasone (FLONASE) 50 MCG/ACT nasal spray Place 1 spray into both nostrils daily.      pseudoephedrine-guaifenesin (MUCINEX D) 60-600 MG 12 hr tablet Take 1 tablet by mouth every 12 (twelve) hours. 30 tablet 0     Patient Stressors: Health problems   Medication change or noncompliance    Patient Strengths: Capable of independent living  Printmaker for treatment/growth  Supportive family/friends   Treatment Modalities: Medication Management, Group therapy, Case management,  1 to 1 session with clinician, Psychoeducation, Recreational therapy.   Physician  Treatment Plan for Primary Diagnosis: Bipolar 1 disorder with moderate mania (HCC) Long Term Goal(s): Improvement in symptoms so as ready for discharge   Short Term Goals: Ability to identify and develop effective coping behaviors will improve Ability to maintain clinical measurements within normal limits will improve Compliance with prescribed medications will improve Ability to identify changes in lifestyle to reduce recurrence of condition will improve Ability to verbalize feelings will improve Ability to disclose and discuss suicidal ideas Ability to demonstrate self-control will improve  Medication Management: Evaluate patient's response, side effects, and tolerance of medication regimen.  Therapeutic Interventions: 1 to 1 sessions, Unit Group sessions and Medication administration.  Evaluation of Outcomes: Progressing  Physician Treatment Plan for Secondary Diagnosis: Principal Problem:   Bipolar 1 disorder with moderate mania (HCC)  Long Term Goal(s): Improvement in symptoms so as ready for discharge   Short Term Goals: Ability to identify and develop effective coping behaviors will improve Ability to maintain clinical measurements within normal limits will improve Compliance with prescribed medications will improve Ability to identify changes in lifestyle to reduce recurrence of condition will improve Ability to verbalize feelings will improve Ability to disclose and discuss suicidal ideas Ability to demonstrate self-control will improve     Medication Management: Evaluate patient's response, side effects, and tolerance of medication regimen.  Therapeutic Interventions: 1 to 1 sessions, Unit Group sessions and Medication administration.  Evaluation of Outcomes: Progressing   RN Treatment Plan  for Primary Diagnosis: Bipolar 1 disorder with moderate mania (HCC) Long Term Goal(s): Knowledge of disease and therapeutic regimen to maintain health will improve  Short Term  Goals: Ability to remain free from injury will improve, Ability to participate in decision making will improve, Ability to verbalize feelings will improve, Ability to disclose and discuss suicidal ideas, and Ability to identify and develop effective coping behaviors will improve  Medication Management: RN will administer medications as ordered by provider, will assess and evaluate patient's response and provide education to patient for prescribed medication. RN will report any adverse and/or side effects to prescribing provider.  Therapeutic Interventions: 1 on 1 counseling sessions, Psychoeducation, Medication administration, Evaluate responses to treatment, Monitor vital signs and CBGs as ordered, Perform/monitor CIWA, COWS, AIMS and Fall Risk screenings as ordered, Perform wound care treatments as ordered.  Evaluation of Outcomes: Progressing   LCSW Treatment Plan for Primary Diagnosis: Bipolar 1 disorder with moderate mania (HCC) Long Term Goal(s): Safe transition to appropriate next level of care at discharge, Engage patient in therapeutic group addressing interpersonal concerns.  Short Term Goals: Engage patient in aftercare planning with referrals and resources, Increase social support, Increase emotional regulation, Facilitate acceptance of mental health diagnosis and concerns, Identify triggers associated with mental health/substance abuse issues, and Increase skills for wellness and recovery  Therapeutic Interventions: Assess for all discharge needs, 1 to 1 time with Social worker, Explore available resources and support systems, Assess for adequacy in community support network, Educate family and significant other(s) on suicide prevention, Complete Psychosocial Assessment, Interpersonal group therapy.  Evaluation of Outcomes: Progressing   Progress in Treatment: Attending groups: Yes. Participating in groups: Yes. Taking medication as prescribed: Yes. Toleration medication:  Yes. Family/Significant other contact made: No, will contact:  Mother and Sister Patient understands diagnosis: No. Discussing patient identified problems/goals with staff: Yes. Medical problems stabilized or resolved: Yes. Denies suicidal/homicidal ideation: Yes. Issues/concerns per patient self-inventory: No. Other: none   New problem(s) identified: No, Describe:  none reported    New Short Term/Long Term Goal(s):    medication stabilization, elimination of SI thoughts, development of comprehensive mental wellness plan.      Patient Goals:  Patient states that "I would like to understand my thoughts and brain more as well as locate a safe place for me to go"   Discharge Plan or Barriers: Patient recently admitted. CSW will continue to follow and assess for appropriate referrals and possible discharge planning.      Reason for Continuation of Hospitalization: Anxiety Depression Mania Suicidal ideation   Estimated Length of Stay: 3-5 days  Last 3 [place] Suicide Severity Risk Score: Flowsheet Row [facility]  C-SSRS RISK CATEGORY High Risk High Risk No Risk       Last PHQ 2/9 Scores:    05/26/2020    2:14 PM 09/03/2017    4:34 PM 08/07/2016    2:12 PM  Depression screen PHQ 2/9  Decreased Interest 3 3 3   Down, Depressed, Hopeless 3 1 2   PHQ - 2 Score 6 4 5   Altered sleeping 0 3 3  Tired, decreased energy 0 3 0  Change in appetite 3 3 0  Feeling bad or failure about yourself  0 3 1  Trouble concentrating 3 0 0  Moving slowly or fidgety/restless 3 0 1  Suicidal thoughts 0  0 0  PHQ-9 Score 15 16 10     Scribe for Treatment Team: 09/12/2021 10:54 AM

## 2021-09-12 NOTE — Group Note (Signed)
  Type of Therapy and Topic:  Group Therapy:  Healthy and Unhealthy Supports  Participation Level:  Active   Description of Group:  Patients in this group were introduced to the idea of adding a variety of healthy supports to address the various needs in their lives.Patients discussed what additional healthy supports could be helpful in their recovery and wellness after discharge in order to prevent future hospitalizations.   An emphasis was placed on using counselor, doctor, therapy groups, 12-step groups, and problem-specific support groups to expand supports.  They also worked as a group on developing a specific plan for several patients to deal with unhealthy supports through boundary-setting, psychoeducation with loved ones, and even termination of relationships.   Therapeutic Goals:   1)  discuss importance of adding supports to stay well once out of the hospital  2)  compare healthy versus unhealthy supports and identify some examples of each  3)  generate ideas and descriptions of healthy supports that can be added  4)  offer mutual support about how to address unhealthy supports  5)  encourage active participation in and adherence to discharge plan    Summary of Patient Progress:  Patient was disorganized in group.  Patient had to be redirected to get back on topic but was easily redirectable.  Patient identified that most of her supports are stressful.  Patient discussed ways that she can strengthen social supports.    Therapeutic Modalities:   Motivational Interviewing Brief Solution-Focused Therapy  Beatris Si, LCSW 09/12/2021  2:02 PM

## 2021-09-12 NOTE — BHH Group Notes (Signed)
Adult Psychoeducational Group Note  Date:  09/12/2021 Time:  11:19 AM  Group Topic/Focus:  Healthy Communication:   The focus of this group is to discuss communication, barriers to communication, as well as healthy ways to communicate with others.  Participation Level:  Active  Participation Quality:  Appropriate  Affect:  Appropriate  Cognitive:  Appropriate  Insight: Appropriate  Engagement in Group:  Improving  Modes of Intervention:  Discussion  Additional Comments:    Donell Beers 09/12/2021, 11:19 AM

## 2021-09-12 NOTE — Progress Notes (Signed)
Pt presents with disruptive behavior. Pt presents with disorganized and delusional  thought processes.  Pt is labile in mood.  Pt transferred to 500 hall without incident.

## 2021-09-13 DIAGNOSIS — F3112 Bipolar disorder, current episode manic without psychotic features, moderate: Secondary | ICD-10-CM | POA: Diagnosis not present

## 2021-09-13 MED ORDER — OLANZAPINE 5 MG PO TABS
5.0000 mg | ORAL_TABLET | Freq: Every day | ORAL | Status: DC
  Administered 2021-09-13 – 2021-09-14 (×2): 5 mg via ORAL
  Filled 2021-09-13 (×4): qty 1

## 2021-09-13 NOTE — Group Note (Signed)
Recreation Therapy Group Note   Group Topic:Stress Management  Group Date: 09/13/2021 Start Time: 1010 End Time: [id number] Facilitators: Victorino Sparrow, LRT,CTRS Location: [address] Dayroom   Goal Area(s) Addresses:  Patient will identify reactions to stress. Patient will identify causes of stress. Patient will identify what they can control related to stress.   Group Description:  Symptoms of Stress.  LRT and patients discussed what stress is and the affects it can have on a person.  Patients were then given worksheets where patients had to identify triggers that cause them stress.  Patient were to then identify the causes of stress they can control versus the ones they couldn't control.  Patients would then share their findings with the group.    Affect/Mood: Appropriate   Participation Level: Engaged   Participation Quality: Independent   Behavior: Preoccupied   Speech/Thought Process: Disorganized   Insight: Poor   Judgement: Poor   Modes of Intervention: Worksheet   Patient Response to Interventions:  Engaged   Education Outcome:  Acknowledges education and In group clarification offered    Clinical Observations/Individualized Feedback: Pt was appropriate but was unable to stay on topic.  Pt would ramble about things that had nothing to do with the topic.  Pt also stayed she thinks 5 years ahead.  Pt was able to identify her symptoms of stress as worry, memory problems and sweating.  Pt expressed her triggers were not being able to go to her locker or go to her own place.  Pt then went on to talk about a storage she has that needs to be bigger.  Pt stated things she can control are "mom, sister, tv, red, gas and car", which the car and tv are things the pt wants.  Lastly, pt stated she can't control her self worth, self respect, intimacy outside of hospital and not pay bills she supposed to.     Plan: Continue to engage patient in RT group sessions 2-3x/week.   Victorino Sparrow, LRT,CTRS 09/13/2021 11:19 AM

## 2021-09-13 NOTE — Progress Notes (Signed)
   09/13/21 2100  Psych Admission Type (Psych Patients Only)  Admission Status Involuntary  Psychosocial Assessment  Patient Complaints Anxiety  Eye Contact Fair  Facial Expression Anxious;Animated  Affect Labile;Preoccupied  Speech Pressured  Interaction Intrusive;Cautious  Motor Activity Restless  Appearance/Hygiene Unremarkable  Behavior Characteristics Cooperative  Mood Anxious;Preoccupied;Suspicious  Aggressive Behavior  Effect No apparent injury  Thought Process  Coherency Disorganized;Flight of ideas;Loose associations  Content Preoccupation;Blaming others  Delusions Paranoid  Perception WDL  Hallucination None reported or observed  Judgment WDL  Confusion Mild  Danger to Self  Current suicidal ideation? Denies  Danger to Others  Danger to Others None reported or observed
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
## 2021-09-13 NOTE — Progress Notes (Signed)
   09/13/21 0500  Sleep  Number of Hours 6

## 2021-09-13 NOTE — Group Note (Signed)
Occupational Therapy Group Note  Group Topic:Other  Group Date: 09/13/2021 Start Time: 1400 End Time: 1500 Facilitators: Ted Mcalpine, OT    In today's OT group, we discussed what boundaries are, how to create and use them, and the benefits of boundary setting. We will also explore the challenges individuals may face while setting and adhering to healthy boundaries, along with practical strategies to overcome these challenges. Setting healthy boundaries is essential for individuals suffering from depression and anxiety, grief, and loss. Healthy boundaries promote self-esteem, reduce stress and anxiety, improve relationships, and increase emotional well-being.  However, individuals may face challenges in setting and adhering to healthy boundaries, such as guilt and shame, fear of rejection, lack of assertiveness, and difficulty identifying personal boundaries.  By practicing self-care, identifying personal values, practicing assertiveness, and seeking support, individuals can overcome these challenges and set and maintain healthy boundaries effectively.  Kerrin Champagne, OT     Participation Level: Active and Engaged   Participation Quality: Independent   Behavior: Appropriate   Speech/Thought Process: Coherent and Directed   Affect/Mood: Appropriate   Insight: Fair   Judgement: Fair   Individualization: Pt was engaged in their participation of group discussion/activity. New skills were identified  Modes of Intervention: Discussion and Education  Patient Response to Interventions:  Attentive, Engaged, Interested , and Receptive   Plan: Continue to engage patient in OT groups 2 - 3x/week.  09/13/2021  Ted Mcalpine, OT Kerrin Champagne, OT

## 2021-09-13 NOTE — [facility] Group Notes (Signed)
Adult Psychoeducational Group Note  Date:  09/13/2021 Time:  8:46 PM  Group Topic/Focus:  Wrap-Up Group:   The focus of this group is to help patients review their daily goal of treatment and discuss progress on daily workbooks.  Participation Level:  Active  Participation Quality:  Appropriate  Affect:  Appropriate  Cognitive:  Appropriate  Insight: Appropriate  Engagement in Group:  Supportive  Modes of Intervention:  Discussion  Additional Comments:  Pt rated day 5 out of 10.   Baldwin [place] 09/13/2021, 8:46 PM

## 2021-09-14 DIAGNOSIS — F3112 Bipolar disorder, current episode manic without psychotic features, moderate: Secondary | ICD-10-CM | POA: Diagnosis not present

## 2021-09-14 MED ORDER — LORAZEPAM 1 MG PO TABS
1.0000 mg | ORAL_TABLET | Freq: Four times a day (QID) | ORAL | Status: DC | PRN
  Administered 2021-10-07 – 2021-12-21 (×6): 1 mg via ORAL
  Filled 2021-09-14 (×6): qty 1

## 2021-09-14 MED ORDER — OLANZAPINE 10 MG PO TABS
10.0000 mg | ORAL_TABLET | Freq: Every day | ORAL | Status: DC
Start: 2021-09-15 — End: 2021-09-15
  Administered 2021-09-15: 10 mg via ORAL
  Filled 2021-09-14 (×3): qty 1

## 2021-09-14 MED ORDER — KATE FARMS STANDARD 1.4 PO LIQD: 325.0000 mL | Freq: Two times a day (BID) | ORAL | Status: DC

## 2021-09-14 MED ORDER — KATE FARMS STANDARD 1.4 PO LIQD
325.0000 mL | Freq: Two times a day (BID) | ORAL | Status: DC
  Administered 2021-09-14 – 2021-12-23 (×128): 325 mL via ORAL
  Filled 2021-09-14 (×205): qty 325

## 2021-09-14 MED ORDER — NICOTINE 14 MG/24HR TD PT24
14.0000 mg | MEDICATED_PATCH | Freq: Every day | TRANSDERMAL | Status: DC
  Administered 2021-09-15 – 2021-09-21 (×7): 14 mg via TRANSDERMAL
  Filled 2021-09-14 (×12): qty 1

## 2021-09-14 NOTE — Progress Notes (Signed)
Natalie Brown woke from nap and was disoriented and unsteady. She walked into patient room briefly but tech redirected. Nurse was notify.

## 2021-09-14 NOTE — Progress Notes (Signed)
   09/14/21 0500  Sleep  Number of Hours 6.75

## 2021-09-14 NOTE — [facility] Group Notes (Signed)
[facility] Group Notes:  (Nursing/MHT/Case Management/Adjunct)  Date:  09/14/2021  Time:  10:39 AM  Type of Therapy:   Orientation/Goals group  Participation Level:  Active  Participation Quality:  Appropriate  Affect:  Appropriate  Cognitive:  Delusional  Insight:  Appropriate  Engagement in Group:  Engaged and Improving  Modes of Intervention:  Discussion, Education, Orientation, and Support  Summary of Progress/Problems: Pt goal for today is  to work on getting a job and to fix her living situation.   Jill Ruppe J Donalyn Schneeberger 09/14/2021, 10:39 AM
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
## 2021-09-14 NOTE — Progress Notes (Signed)
[facility]  09/14/2021 3:43 PM Nobie Putnamngela M Hilmer  MRN:  [id number]  Chief Complaint: disorganization, delusions  Reason for Admission:  Nobie Putnamngela M Aylward is a 56 y.o. female with a history of bipolar d/o, who was initially admitted for inpatient psychiatric hospitalization on 09/06/2021 for management of delusions and disorganized thinking. The patient is currently on Hospital Day 8.   Chart Review from last 24 hours:  The patient's chart was reviewed and nursing notes were reviewed. The patient's case was discussed in multidisciplinary team meeting. Per nursing, she has been labile, delusional,  intrusive, and disorganized. She attended groups but could not stay on topic in RT group. She had no acute behavioral issues or safety concerns noted. [facility] with [facility]. She did require PRNs today around lunch time for agitation.  Information Obtained Today During Patient Interview: The patient was seen and evaluated on the unit.  She remains pleasant but disorganized on exam. She shows examiner her "diary" which she states she has written in daily. The papers in her folder that she has written on have incomplete sentences and illogical statements. She continues to express grandiose delusions that she is a Estate agent"nurse in diabetic and nutrition" and that she worked for [employer] and has a job in Engineer, manufacturing"system analysis and business finance." She denies AVH, paranoia, ideas of reference or first rank symptoms. She denies SI or HI. [name] less pressured today and denies medication side-effects. She voices no physical complaints.   Call to mother Joanette GulaJulia Walker [phone]): Spoke for 15 minutes Update provided regarding recent symptoms and current medications and questions answered. Mother confirms that patient's symptoms are delusional, but she has had periods of time where delusions are not this prominent in the past. I advised patient is now under IVC. Time given for  questions.  Principal Problem: Bipolar 1 disorder with moderate mania (HCC) Diagnosis: Active Problems:   Bipolar affective disorder, current episode manic with psychotic symptoms (HCC)  Total Time Spent in Direct Patient Care:  I personally spent 40 minutes on the unit in direct patient care. The direct patient care time included face-to-face time with the patient, reviewing the patient's chart, communicating with other professionals, and coordinating care. Greater than 50% of this time was spent in counseling or coordinating care with the patient regarding goals of hospitalization, psycho-education, and discharge planning needs.  Past Psychiatric History: see H&P  Past Medical History:  Past Medical History:  Diagnosis Date   Bipolar 1 disorder with moderate mania (HCC) 12/30/2013   Hypertension    Family History: see H&P  Family Psychiatric  History: see H&P  Social History:  Social History   Substance and Sexual Activity  Alcohol Use No     Social History   Substance and Sexual Activity  Drug Use No    Social History   Socioeconomic History   Marital status: Married    Spouse name: Not on file   Number of children: Not on file   Years of education: Not on file   Highest education level: Not on file  Occupational History   Not on file  Tobacco Use   Smoking status: Every Day   Smokeless tobacco: Current  Substance and Sexual Activity   Alcohol use: No   Drug use: No   Sexual activity: Not on file  Other Topics Concern   Not on file  Social History Narrative   Not on file   Social Determinants of Health  Financial Resource Strain: Not on file  Food Insecurity: Not on file  Transportation Needs: Not on file  Physical Activity: Not on file  Stress: Not on file  Social Connections: Not on file    Sleep: Good  Appetite:  Good  Current Medications: Current Facility-Administered Medications  Medication Dose Route Frequency Provider Last Rate Last  Admin   alum & mag hydroxide-simeth (MAALOX/MYLANTA) 200-200-20 MG/5ML suspension 30 mL  30 mL Oral Q4H PRN Massengill, Nathan, MD       benztropine (COGENTIN) tablet 1 mg  1 mg Oral BID PRN Bartholomew Crews E, MD   1 mg at 09/14/21 1254   benztropine mesylate (COGENTIN) injection 1 mg  1 mg Intramuscular BID PRN Comer Locket, MD       divalproex (DEPAKOTE ER) 24 hr tablet 1,000 mg  1,000 mg Oral QHS Sireen Halk E, MD   1,000 mg at 09/13/21 2101   docusate sodium (COLACE) capsule 100 mg  100 mg Oral Daily Mason Jim, Larhonda Dettloff E, MD   100 mg at 09/14/21 5732   feeding supplement (KATE FARMS STANDARD 1.4) liquid 325 mL  325 mL Oral BID BM Massengill, Harrold Donath, MD   325 mL at 09/14/21 0828   fluticasone (FLONASE) 50 MCG/ACT nasal spray 1 spray  1 spray Each Nare Daily Massengill, Nathan, MD   1 spray at 09/14/21 0830   hydrOXYzine (ATARAX) tablet 25 mg  25 mg Oral TID PRN Phineas Inches, MD   25 mg at 09/10/21 1204   LORazepam (ATIVAN) tablet 1 mg  1 mg Oral BID Mason Jim, Amalee Olsen E, MD   1 mg at 09/14/21 2025   magnesium hydroxide (MILK OF MAGNESIA) suspension 30 mL  30 mL Oral Daily PRN Massengill, Harrold Donath, MD       OLANZapine (ZYPREXA) tablet 10 mg  10 mg Oral Q2000 Mason Jim, Marranda Arakelian E, MD   10 mg at 09/13/21 2102   OLANZapine (ZYPREXA) tablet 5 mg  5 mg Oral Daily Mason Jim, Lonna Rabold E, MD   5 mg at 09/14/21 0828   OLANZapine zydis (ZYPREXA) disintegrating tablet 5 mg  5 mg Oral Q8H PRN Bartholomew Crews E, MD   5 mg at 09/14/21 1255   And   ziprasidone (GEODON) injection 20 mg  20 mg Intramuscular PRN Comer Locket, MD        Lab Results:  No results found for this or any previous visit (from the past 48 hour(s)).   Blood Alcohol level:  Lab Results  Component Value Date   ETH <10 09/05/2021    Metabolic Disorder Labs: Lab Results  Component Value Date   HGBA1C 5.3 09/08/2021   MPG 105.41 09/08/2021   No results found for: PROLACTIN Lab Results  Component Value Date   CHOL 173 09/08/2021    TRIG 110 09/08/2021   HDL 50 09/08/2021   CHOLHDL 3.5 09/08/2021   VLDL 22 09/08/2021   LDLCALC 101 (H) 09/08/2021   LDLCALC 102 (H) 06/21/2020    Physical Findings:  Musculoskeletal: Strength & Muscle Tone: within normal limits Gait & Station: normal Patient leans: N/A  Psychiatric Specialty Exam:  Presentation  General Appearance: casually dressed, fair hygiene, wearing hair bonnet   Eye Contact:Fair  Speech: rambling but less pressured; occasionally speaks with accent but does speak [place] today   Speech Volume: normal  Mood and Affect  Mood: Elevated and mildly anxious  Affect:polite, less labile but anxious  Thought Process  Thought Processes:Disorganized with loose associations, ruminative about desire for discharge  Orientation: Oriented to self, month, year and city but not situation  Thought Content:Has ongoing grandiose delusions,denies AVH, paranoia, or ideas of reference and is not grossly responding to internal/external stimuli on exam; denies ideas of reference or first rank symptoms; less paranoid appearing  Hallucinations: Denied  Ideas of Reference:Denied  Suicidal Thoughts: denied  Homicidal Thoughts:denied  Sensorium  Memory:Limited secondary to mania/psychosis  Judgment:Impaired  Insight: Poor  Executive Functions  Concentration: Fair  Attention Span:Poor  Recall:Poor  Fund of Knowledge:Limited secondary to psychosis/mania  Language:Fair   Psychomotor Activity  Psychomotor Activity:Normal - less animated and less fidgety today  Assets  Assets:Communication Skills; Desire for Improvement; Physical Health   Sleep  6.75 hours   Physical Exam Vitals and nursing note reviewed.  Constitutional:      Appearance: Normal appearance.  HENT:     Head: Normocephalic.  Pulmonary:     Effort: Pulmonary effort is normal.  Neurological:     General: No focal deficit present.     Mental Status: [name] alert.   Review of  Systems  Constitutional:  Negative for fever.  Respiratory:  Negative for shortness of breath.   Cardiovascular:  Negative for chest pain.  Gastrointestinal:  Negative for constipation, diarrhea, nausea and vomiting.  Neurological:  Negative for headaches.  Blood pressure 117/81, pulse (!) 105, temperature 97.8 F (36.6 C), temperature source Oral, resp. rate 16, height 5\' 6"  (1.676 m), weight 72.6 kg, SpO2 100 %. Body mass index is 25.83 kg/m.   Treatment Plan Summary: Diagnoses / Active Problems: Bipolar I MRE manic severe with psychotic features  PLAN: Safety and Monitoring:  -- Converted to IVC admission  -- Daily contact with patient to assess and evaluate symptoms and progress in treatment  -- Patient's case to be discussed in multi-disciplinary team meeting  -- Observation Level : q15 minute checks  -- Vital signs:  q12 hours  -- Precautions: suicide, elopement, and assault  2. Psychiatric Diagnoses and Treatment:   Bipolar I MRE manic with psychotic features  --Increase to Zyprexa 10mg  qam and 10mg  at 2000 for mania/psychosis   -- Continue VPA ER 1000mg  qhs for mania (will need more meaningful discussion of psychoeducation about med as she clears; admission AST 22 and ALT 14 and admission CBC WNL - will repeat LFT, VPA level and CBC Sunday night for trough/trending)  -- Continue Ativan 1mg  bid for help with intrusive behaviors/mania  -- Metabolic profile and EKG monitoring obtained while on an atypical antipsychotic (Lipid Panel: WNL except for LDL 101; HbgA1c: 5.3; QTc:428ms )   -- Zyprexa/Geodon/ativan agitation protocol  -- Encouraged patient to participate in unit milieu and in scheduled group therapies     3. Medical Issues Being Addressed:   Mildly elevated creatinine - improving  -- repeat BMP shows creatinine down to 1.01 and po hydration encouraged   Elevated BhCG with negative urine pregnancy test  -- Repeat beta HCG is 5 and 2nd repeat UPT is negative- Gyn  states this is post-menopausal change and we can proceed with start of VPA  4. Discharge Planning:   -- Social work and case management to assist with discharge planning and identification of hospital follow-up needs prior to discharge  -- Discharge Concerns: Need to establish a safety plan; Medication compliance and effectiveness  -- Discharge Goals: Return home with outpatient referrals for mental health follow-up including medication management/psychotherapy  , MD, FAPA 09/14/2021, 3:43 PM

## 2021-09-14 NOTE — Plan of Care (Signed)
  Problem: Coping: Goal: Ability to verbalize frustrations and anger appropriately will improve Outcome: Progressing Goal: Ability to demonstrate self-control will improve Outcome: Progressing   Problem: Activity: Goal: Interest or engagement in activities will improve Outcome: Progressing Goal: Sleeping patterns will improve Outcome: Progressing   

## 2021-09-14 NOTE — Group Note (Signed)
LCSW Group Therapy Note   Group Date: 09/14/2021 Start Time: 1300 End Time: 1400   Type of Therapy and Topic:  Group Therapy: Boundaries  Participation Level:  Active  Description of Group: This group will address the use of boundaries in their personal lives. Patients will explore why boundaries are important, the difference between healthy and unhealthy boundaries, and negative and postive outcomes of different boundaries and will look at how boundaries can be crossed.  Patients will be encouraged to identify current boundaries in their own lives and identify what kind of boundary is being set. Facilitators will guide patients in utilizing problem-solving interventions to address and correct types boundaries being used and to address when no boundary is being used. Understanding and applying boundaries will be explored and addressed for obtaining and maintaining a balanced life. Patients will be encouraged to explore ways to assertively make their boundaries and needs known to significant others in their lives, using other group members and facilitator for role play, support, and feedback.  Therapeutic Goals:  1.  Patient will identify areas in their life where setting clear boundaries could be  used to improve their life.  2.  Patient will identify signs/triggers that a boundary is not being respected. 3.  Patient will identify two ways to set boundaries in order to achieve balance in  their lives: 4.  Patient will demonstrate ability to communicate their needs and set boundaries  through discussion and/or role plays  Summary of Patient Progress:  Alonzo was present/active throughout the session.  She was disorganized and  Patient demonstrated poor insight into the subject matter.  Patient often got off topic and was hard to redirect.   Therapeutic Modalities:   Cognitive Behavioral Therapy Solution-Focused Therapy  Beatris Si, LCSW 09/14/2021  11:27 AM

## 2021-09-14 NOTE — Progress Notes (Signed)
NUTRITION ASSESSMENT  RD consulted for assessment. Pt reports needing a vegan diet.  INTERVENTION: 1. Supplements: Molli Posey 1.4 PO BID, each provides 455 kcals and 20g protein 2. Placed order for vegetarian diet with needing vegan menu items  NUTRITION DIAGNOSIS: Unintentional weight loss related to sub-optimal intake as evidenced by pt report.   Goal: Pt to meet >/= 90% of their estimated nutrition needs.  Monitor:  PO intake  Assessment:  Pt admitted for delusions and disorganized thinking. Pt reports to MD that she needs to be on a vegan diet. Since pt follows a vegan diet, likely will not meet protein needs while admitted. Will order vegan protein supplement Molli Posey to help meet protein needs. Have switched diet to vegetarian diet in order to alert kitchen.  Height: Ht Readings from Last 1 Encounters:  09/06/21 5\' 6"  (1.676 m)    Weight: Wt Readings from Last 1 Encounters:  09/06/21 72.6 kg    Weight Hx: Wt Readings from Last 10 Encounters:  09/06/21 72.6 kg  09/05/21 72.6 kg  11/20/20 73.9 kg  06/20/20 72.1 kg  09/10/18 72.8 kg  09/19/17 74.5 kg  09/03/17 74.3 kg  08/07/16 71.7 kg  06/29/15 63.5 kg  02/22/14 63 kg    BMI:  Body mass index is 25.83 kg/m. Pt meets criteria for normal based on current BMI.  Estimated Nutritional Needs: Kcal: 25-30 kcal/kg Protein: > 1 gram protein/kg Fluid: 1 ml/kcal  Diet Order:  Diet Order             Diet vegetarian Room service appropriate? Yes; Fluid consistency: Thin  Diet effective now                  Pt is also offered choice of unit snacks mid-morning and mid-afternoon.   Lab results and medications reviewed.   13/03/15, MS, RD, LDN Inpatient Clinical Dietitian Contact information available via Amion

## 2021-09-14 NOTE — Group Note (Signed)
Recreation Therapy Group Note   Group Topic:Leisure Education  Group Date: 09/14/2021 Start Time: 1005 End Time: 1045 Facilitators: Caroll Rancher, LRT,CTRS Location: [address] Dayroom   Goal Area(s) Addresses:  Patient will successfully identify positive leisure and recreation activities.  Patient will acknowledge benefits of participation in healthy leisure activities post discharge.   Group Description: Brain Teasers. LRT explained brain teasers to patients incase they didn't know what they were.  Patients were then give 2 sheets with various brain teasers on them.  Each patient was to examine the clues to determine what each brain teaser was.  Patients were given 20-25 minutes to complete the brain teasers.  LRT and patients then went over the answers patients came up with.  This activity was to show patients that leisure doesn't always have to be an extravagant experience.  They can take the smallest thing and make it a leisurely experience.    Affect/Mood: Appropriate   Participation Level: Engaged   Participation Quality: Maximum Cues   Behavior: Appropriate   Speech/Thought Process: Disorganized   Insight: Impaired   Judgement: Impaired   Modes of Intervention: Activity   Patient Response to Interventions:  Receptive   Education Outcome:  Acknowledges education and In group clarification offered    Clinical Observations/Individualized Feedback: Pt was appropriate during group.  Pt was very confused with the activity.  LRT attempted to assist pt by breaking down the pictures on the sheet to try to help pt figure them out but pt was unable to grasp the concept of brain teasers.  Pt was engaged but struggled the entire activity.    Plan: Continue to engage patient in RT group sessions 2-3x/week.   Caroll Rancher, Antonietta Jewel  09/14/2021 12:59 PM

## 2021-09-15 DIAGNOSIS — F3112 Bipolar disorder, current episode manic without psychotic features, moderate: Secondary | ICD-10-CM | POA: Diagnosis not present

## 2021-09-15 MED ORDER — OLANZAPINE 10 MG PO TBDP
10.0000 mg | ORAL_TABLET | Freq: Two times a day (BID) | ORAL | Status: DC
Start: 2021-09-15 — End: 2021-09-16
  Administered 2021-09-16: 10 mg via ORAL
  Filled 2021-09-15 (×3): qty 1

## 2021-09-15 MED ORDER — OLANZAPINE 10 MG PO TBDP
10.0000 mg | ORAL_TABLET | Freq: Two times a day (BID) | ORAL | Status: DC
  Filled 2021-09-15 (×2): qty 1

## 2021-09-15 NOTE — Progress Notes (Addendum)
[facility] MD Progress Note  09/15/2021 6:43 PM Natalie Brown  MRN:  [id number]  Subjective: "I feel good, and have not felt like this in 25 years."  Reason for Admission:  Natalie Brown is a 56 y.o. female with a history of bipolar d/o, who was initially admitted for inpatient psychiatric hospitalization on 09/06/2021 for management of delusions and disorganized thinking. The patient is currently on Hospital Day 10.   The patient's chart was reviewed and nursing notes were reviewed. Findings shared with the tx team and discussed with Dr. Mason Jim.The patient's case was discussed in multidisciplinary team meeting. Per nursing, yesterday she did require Cogentin, zyprexa po and ativan po yesterday X1 for agitation around lunch time after almost getting into an altercation on the unit. Yesterday evening, she awoke from a nap, appeared disoriented, disrobed, and tried to walk into a patient's room requiring redirection. Patient remains delusional and disorganized. She attended groups but could not stay on topic in RT group.  Per MAR she did not receive scheduled VPA or zyprexa yesterday due to being asleep.   During Patient Interview Today: The patient was seen and evaluated on the unit.  She remains pleasantly disorganized on exam. She continues to express grandiose delusions that she is a Production manager with the [employer]"  She denies SI, HI, AVH, paranoia, ideas of reference. [name] pleasant and less pressured today and denies medication side-effects. She voices no physical complaints. Ammonia level to check for mental state and Valproic acid levels to be obtained in am. Zyprexa change to disintegrating tablets.  Principal Problem: Bipolar 1 disorder with moderate mania (HCC) Diagnosis: Active Problems:   Bipolar affective disorder, current episode manic with psychotic symptoms (HCC)  Past Psychiatric History: see H&P  Past Medical History:  Past Medical History:  Diagnosis Date   Bipolar 1 disorder  with moderate mania (HCC) 12/30/2013   Hypertension    Family History: see H&P  Family Psychiatric  History: see H&P  Social History:  Social History   Substance and Sexual Activity  Alcohol Use No     Social History   Substance and Sexual Activity  Drug Use No    Social History   Socioeconomic History   Marital status: Married    Spouse name: Not on file   Number of children: Not on file   Years of education: Not on file   Highest education level: Not on file  Occupational History   Not on file  Tobacco Use   Smoking status: Every Day   Smokeless tobacco: Current  Substance and Sexual Activity   Alcohol use: No   Drug use: No   Sexual activity: Not on file  Other Topics Concern   Not on file  Social History Narrative   Not on file   Social Determinants of Health   Financial Resource Strain: Not on file  Food Insecurity: Not on file  Transportation Needs: Not on file  Physical Activity: Not on file  Stress: Not on file  Social Connections: Not on file    Sleep: Good  Appetite:  Good  Current Medications: Current Facility-Administered Medications  Medication Dose Route Frequency Provider Last Rate Last Admin   alum & mag hydroxide-simeth (MAALOX/MYLANTA) 200-200-20 MG/5ML suspension 30 mL  30 mL Oral Q4H PRN Massengill, Nathan, MD       benztropine (COGENTIN) tablet 1 mg  1 mg Oral BID PRN Comer Locket, MD   1 mg at 09/14/21 1254   benztropine mesylate (  COGENTIN) injection 1 mg  1 mg Intramuscular BID PRN Comer Locket, MD       divalproex (DEPAKOTE ER) 24 hr tablet 1,000 mg  1,000 mg Oral QHS Mason Jim, Lorayne Getchell E, MD   1,000 mg at 09/13/21 2101   docusate sodium (COLACE) capsule 100 mg  100 mg Oral Daily Mason Jim, Maynor Mwangi E, MD   100 mg at 09/15/21 0830   feeding supplement (KATE FARMS STANDARD 1.4) liquid 325 mL  325 mL Oral BID BM Massengill, Harrold Donath, MD   325 mL at 09/15/21 1705   fluticasone (FLONASE) 50 MCG/ACT nasal spray 1 spray  1 spray Each Nare  Daily Massengill, Harrold Donath, MD   1 spray at 09/15/21 0830   hydrOXYzine (ATARAX) tablet 25 mg  25 mg Oral TID PRN Phineas Inches, MD   25 mg at 09/10/21 1204   LORazepam (ATIVAN) tablet 1 mg  1 mg Oral BID Bartholomew Crews E, MD   1 mg at 09/15/21 1704   LORazepam (ATIVAN) tablet 1 mg  1 mg Oral Q6H PRN Comer Locket, MD       magnesium hydroxide (MILK OF MAGNESIA) suspension 30 mL  30 mL Oral Daily PRN Massengill, Nathan, MD       nicotine (NICODERM CQ - dosed in mg/24 hours) patch 14 mg  14 mg Transdermal Daily Mason Jim, Alejandria Wessells E, MD   14 mg at 09/15/21 0829   OLANZapine (ZYPREXA) tablet 10 mg  10 mg Oral Q2000 Mason Jim, Miyo Aina E, MD   10 mg at 09/13/21 2102   OLANZapine (ZYPREXA) tablet 10 mg  10 mg Oral Daily Mason Jim, Tereso Unangst E, MD   10 mg at 09/15/21 0830   OLANZapine zydis (ZYPREXA) disintegrating tablet 5 mg  5 mg Oral Q8H PRN Bartholomew Crews E, MD   5 mg at 09/14/21 1255   And   ziprasidone (GEODON) injection 20 mg  20 mg Intramuscular PRN Comer Locket, MD        Lab Results:  No results found for this or any previous visit (from the past 48 hour(s)).   Blood Alcohol level:  Lab Results  Component Value Date   ETH <10 09/05/2021    Metabolic Disorder Labs: Lab Results  Component Value Date   HGBA1C 5.3 09/08/2021   MPG 105.41 09/08/2021   No results found for: PROLACTIN Lab Results  Component Value Date   CHOL 173 09/08/2021   TRIG 110 09/08/2021   HDL 50 09/08/2021   CHOLHDL 3.5 09/08/2021   VLDL 22 09/08/2021   LDLCALC 101 (H) 09/08/2021   LDLCALC 102 (H) 06/21/2020    Physical Findings:  Musculoskeletal: Strength & Muscle Tone: within normal limits Gait & Station: normal Patient leans: N/A  Psychiatric Specialty Exam:  Presentation  General Appearance: casually dressed, fair hygiene, wearing hair bonnet   Eye Contact:Fair  Speech: rambling but less pressured; occasionally speaks with accent but does speak [place] today   Speech Volume:  normal  Mood and Affect  Mood: Elevated and mildly anxious  Affect:polite, less labile but anxious  Thought Process  Thought Processes:Disorganized with loose associations, ruminative about desire for discharge  Orientation: Oriented to self, month, year and city but not situation  Thought Content:Has ongoing grandiose delusions,denies AVH, paranoia, or ideas of reference and is not grossly responding to internal/external stimuli on exam; denies ideas of reference or first rank symptoms; less paranoid appearing  Hallucinations: Denied  Ideas of Reference:Denied  Suicidal Thoughts: denied  Homicidal Thoughts:denied  Sensorium  Memory:Limited  secondary to mania/psychosis  Judgment:Impaired  Insight: Poor  Executive Functions  Concentration: Fair  Attention Span:Fair  Recall:Poor  Fund of Knowledge:Limited secondary to psychosis/mania  Language:Fair   Psychomotor Activity  Psychomotor Activity:Normal - less animated and less fidgety today  Assets  Assets:Communication Skills; Physical Health   Sleep  6.75 hours   Physical Exam Vitals and nursing note reviewed.  Constitutional:      Appearance: Normal appearance.  HENT:     Head: Normocephalic.  Pulmonary:     Effort: Pulmonary effort is normal.  Neurological:     General: No focal deficit present.     Mental Status: [name] alert.   Review of Systems  Constitutional:  Negative for fever.  Respiratory:  Negative for shortness of breath.   Cardiovascular:  Negative for chest pain.  Gastrointestinal:  Negative for constipation, diarrhea, nausea and vomiting.  Neurological:  Negative for headaches.  Blood pressure 108/87, pulse 100, temperature 97.8 F (36.6 C), temperature source Oral, resp. rate 16, height 5\' 6"  (1.676 m), weight 72.6 kg, SpO2 100 %. Body mass index is 25.83 kg/m.   Treatment Plan Summary: Diagnoses / Active Problems: Bipolar I MRE manic severe with psychotic  features  PLAN: Safety and Monitoring:  -- Converted to IVC admission  -- Daily contact with patient to assess and evaluate symptoms and progress in treatment  -- Patient's case to be discussed in multi-disciplinary team meeting  -- Observation Level : q15 minute checks  -- Vital signs:  q12 hours  -- Precautions: suicide, elopement, and assault  2. Psychiatric Diagnoses and Treatment:   Bipolar I MRE manic with psychotic features  --Increase to Zyprexa 10mg  qam and 10mg  at 2000 for mania/psychosis              -Note:Zyprexa change to disintegrating tablets to ensure not cheeking.  -- Continue VPA ER 1000mg  qhs for mania (will need more meaningful discussion of psychoeducation about med as she clears; admission AST 22 and ALT 14 and admission CBC WNL - will repeat CMP, VPA level and CBC and check NH3 level Sunday morning for trough/trending)  -- Continue Ativan 1mg  bid for help with intrusive behaviors/mania  -- Metabolic profile and EKG monitoring obtained while on an atypical antipsychotic (Lipid Panel: WNL except for LDL 101; HbgA1c: 5.3; QTc:44211ms )   -- Zyprexa/Geodon/ativan agitation protocol  -- Encouraged patient to participate in unit milieu and in scheduled group therapies     3. Medical Issues Being Addressed:   Mildly elevated creatinine - improving  -- repeat BMP shows creatinine down to 1.01 and po hydration encouraged   Elevated BhCG with negative urine pregnancy test  -- Repeat beta HCG is 5 and 2nd repeat UPT is negative- Gyn states this is post-menopausal change and we can proceed with start of VPA  4. Discharge Planning:   -- Social work and case management to assist with discharge planning and identification of hospital follow-up needs prior to discharge  -- Discharge Concerns: Need to establish a safety plan; Medication compliance and effectiveness  -- Discharge Goals: Return home with outpatient referrals for mental health follow-up including medication  management/psychotherapy  Cecilie Lowersina C Ntuen, FNP, Psychiatry 09/15/2021, 6:43 PM Patient ID: Nobie PutnamAngela M Batzel, female   DOB: 04-07-1966, 56 y.o.   MRN: [id number]

## 2021-09-15 NOTE — [facility] Group Notes (Signed)
Adult Psychoeducational Group Note  Date:  09/15/2021 Time:  8:32 PM  Group Topic/Focus:  Wrap-Up Group:   The focus of this group is to help patients review their daily goal of treatment and discuss progress on daily workbooks.  Participation Level:  Active  Participation Quality:  Appropriate  Affect:  Appropriate  Cognitive:  Appropriate  Insight: Appropriate  Engagement in Group:  Supportive  Modes of Intervention:  Discussion  Additional Comments:  Pt sai they would take shower and clean room for self-care  Deeann Dowse 09/15/2021, 8:32 PM

## 2021-09-15 NOTE — [facility] Group Notes (Signed)
[facility] Group Notes:  (Nursing/MHT/Case Management/Adjunct)  Date:  09/15/2021  Time:  11:05 AM  Type of Therapy:   Orientation/Goals group  Participation Level:  Active  Participation Quality:  Appropriate  Affect:  Appropriate and Not Congruent  Cognitive:  Disorganized, Confused, and Delusional  Insight:  Lacking  Engagement in Group:  Engaged and Improving  Modes of Intervention:  Discussion, Education, Orientation, and Support  Summary of Progress/Problems: Pt goal for today is to take medications and attend groups.  Natalie Brown 09/15/2021, 11:05 AM

## 2021-09-15 NOTE — Progress Notes (Signed)
Patient denies SI, HI and AVH. Patient has been sleeping after receiving PRN medications this evening. When patient woke up she disrobed and began to wander patient was redirected, took a shower and appropriately dressed.   Assess patient for safety, offer medications as prescribed, engage patient in 1:1 staff talks.   Patient able to contract for safety, continue to monitor as planned.

## 2021-09-15 NOTE — [facility] Group Notes (Signed)
Psychoeducational Group Note  Date: 09/15/2021 Time: 0900-1000    Goal Setting   Purpose of Group: This group helps to provide patients with the steps of setting a goal that is specific, measurable, attainable, realistic and time specific. A discussion on how we keep ourselves stuck with negative self talk. Homework given for Patients to write 30 positive attributes about themselves.    Participation Level:  did not attend  Natalie Brown A 

## 2021-09-15 NOTE — Group Note (Signed)
  [facility] LCSW Group Therapy Note  Date/Time:  09/15/2021 11:15AM-12:00PM  Type of Therapy and Topic:  Group Therapy:  Feelings About Hospitalization  Participation Level:  Active   Description of Group This process group involved patients discussing their feelings related to being hospitalized, as well as the benefits they see to being in the hospital.  These feelings and benefits were itemized.  The group then brainstormed specific ways in which they could seek those same benefits when they discharge and return home.  Therapeutic Goals Patient will identify and describe positive and negative feelings related to hospitalization Patient will verbalize benefits of hospitalization to themselves personally Patients will brainstorm together ways they can obtain similar benefits in the outpatient setting, identify barriers to wellness and possible solutions  Summary of Patient Progress:  The patient expressed her primary feelings about being hospitalized are that she is "safe."  She then listed a number of unrelated thoughts and questions by reading them off a piece of paper.  Most had to do with having anemia, needing different things at discharge, and such.  She did not participate a lot and was often not on task when she did.  Therapeutic Modalities Cognitive Behavioral Therapy Motivational Interviewing    Ambrose Mantle, LCSW 09/15/2021, 1:06 PM

## 2021-09-16 DIAGNOSIS — F3112 Bipolar disorder, current episode manic without psychotic features, moderate: Secondary | ICD-10-CM | POA: Diagnosis not present

## 2021-09-16 LAB — CBC WITH DIFFERENTIAL/PLATELET
Abs Immature Granulocytes: 0.03 10*3/uL (ref 0.00–0.07)
Basophils Absolute: 0 10*3/uL (ref 0.0–0.1)
Basophils Relative: 0 %
Eosinophils Absolute: 0.3 10*3/uL (ref 0.0–0.5)
Eosinophils Relative: 6 %
HCT: 41.6 % (ref 36.0–46.0)
Hemoglobin: 13.5 g/dL (ref 12.0–15.0)
Immature Granulocytes: 1 %
Lymphocytes Relative: 35 %
Lymphs Abs: 1.7 10*3/uL (ref 0.7–4.0)
MCH: 30.9 pg (ref 26.0–34.0)
MCHC: 32.5 g/dL (ref 30.0–36.0)
MCV: 95.2 fL (ref 80.0–100.0)
Monocytes Absolute: 0.2 10*3/uL (ref 0.1–1.0)
Monocytes Relative: 5 %
Neutro Abs: 2.5 10*3/uL (ref 1.7–7.7)
Neutrophils Relative %: 53 %
Platelets: 260 10*3/uL (ref 150–400)
RBC: 4.37 MIL/uL (ref 3.87–5.11)
RDW: 14 % (ref 11.5–15.5)
WBC: 4.8 10*3/uL (ref 4.0–10.5)
nRBC: 0 % (ref 0.0–0.2)

## 2021-09-16 LAB — COMPREHENSIVE METABOLIC PANEL
ALT: 37 U/L (ref 0–44)
AST: 33 U/L (ref 15–41)
Albumin: 3.6 g/dL (ref 3.5–5.0)
Alkaline Phosphatase: 70 U/L (ref 38–126)
Anion gap: 9 (ref 5–15)
BUN: 19 mg/dL (ref 6–20)
CO2: 27 mmol/L (ref 22–32)
Calcium: 8.6 mg/dL — ABNORMAL LOW (ref 8.9–10.3)
Chloride: 101 mmol/L (ref 98–111)
Creatinine, Ser: 1.08 mg/dL — ABNORMAL HIGH (ref 0.44–1.00)
GFR, Estimated: >60 mL/min (ref >60)
Glucose, Bld: 131 mg/dL — ABNORMAL HIGH (ref 70–99)
Potassium: 4.1 mmol/L (ref 3.5–5.1)
Sodium: 137 mmol/L (ref 135–145)
Total Bilirubin: 0.6 mg/dL (ref 0.3–1.2)
Total Protein: 7.3 g/dL (ref 6.5–8.1)

## 2021-09-16 LAB — VALPROIC ACID LEVEL: Valproic Acid Lvl: 20 ug/mL — ABNORMAL LOW (ref 50.0–100.0)

## 2021-09-16 LAB — AMMONIA: Ammonia: 32 umol/L (ref 9–35)

## 2021-09-16 MED ORDER — DIVALPROEX SODIUM ER 500 MG PO TB24
1000.0000 mg | ORAL_TABLET | Freq: Every day | ORAL | Status: DC
Start: 2021-09-16 — End: 2021-09-16
  Filled 2021-09-16 (×2): qty 2

## 2021-09-16 MED ORDER — OLANZAPINE 10 MG PO TBDP: 10.0000 mg | ORAL_TABLET | Freq: Two times a day (BID) | ORAL | Status: DC

## 2021-09-16 MED ORDER — OLANZAPINE 10 MG PO TBDP
10.0000 mg | ORAL_TABLET | Freq: Every day | ORAL | Status: DC
  Administered 2021-09-17: 10 mg via ORAL
  Filled 2021-09-16 (×4): qty 1

## 2021-09-16 MED ORDER — DIVALPROEX SODIUM ER 500 MG PO TB24
1500.0000 mg | ORAL_TABLET | Freq: Every day | ORAL | Status: DC
  Administered 2021-09-16 – 2021-09-18 (×3): 1500 mg via ORAL
  Filled 2021-09-16 (×6): qty 3

## 2021-09-16 MED ORDER — DIVALPROEX SODIUM ER 500 MG PO TB24: 1000.0000 mg | ORAL_TABLET | Freq: Every day | ORAL | Status: DC

## 2021-09-16 MED ORDER — OLANZAPINE 10 MG PO TBDP
10.0000 mg | ORAL_TABLET | Freq: Every day | ORAL | Status: DC
  Administered 2021-09-16: 10 mg via ORAL
  Filled 2021-09-16 (×3): qty 1

## 2021-09-16 NOTE — Plan of Care (Signed)
  Problem: Education: Goal: Mental status will improve Outcome: Progressing   Problem: Activity: Goal: Interest or engagement in activities will improve Outcome: Progressing Goal: Sleeping patterns will improve Outcome: Progressing   Problem: Activity: Goal: Sleeping patterns will improve Outcome: Progressing  Patient interacting well with Peers and Staff. Denies SI/HI/A/VH at present and Support and encouragement provided.

## 2021-09-16 NOTE — Progress Notes (Addendum)
[facility] MD Progress Note  09/16/2021 8:10 AM Natalie Brown  MRN:  [id number]  Chief Complaint: mania and psychosis  Reason for Admission:  Natalie Brown is a 56 y.o. female with a history of bipolar d/o, who was initially admitted for inpatient psychiatric hospitalization on 09/06/2021 for management of delusions and disorganized thinking. The patient is currently on Hospital Day 10.   Chart Review from last 24 hours: The patient's chart was reviewed and nursing notes were reviewed.The patient's case was discussed in multidisciplinary team meeting. Per nursing, she attended select groups but had difficulty staying on topic. She had no documented behavioral issues during day shift yesterday or overnight. Per MAR, she was compliant with scheduled medications with exception of not getting Zyprexa last night due to sleeping and receiving VPA dose this morning rather than last night. She did not require PRNs.   Information Obtained Today During Patient Interview: The patient was seen and evaluated on the unit.  She appears sedated this morning after receiving morning meds.  She states that she slept well overnight and has a good appetite.  She denies AVH, paranoia, ideas of reference or first rank symptoms.  She denies SI or HI.  She continues to have delusional believes that she has 4 different resumes, speaks multiple languages, and has a license in nutrition and diabetic management.  She derails into discussion about having O- blood and diabetes in her family.  She voices no physical complaints and denies medication side effects.   Principal Problem: Bipolar 1 disorder with moderate mania (HCC) Diagnosis: Active Problems:   Bipolar affective disorder, current episode manic with psychotic symptoms ([place])  Past Psychiatric History: see H&P  Past Medical History:  Past Medical History:  Diagnosis Date   Bipolar 1 disorder with moderate mania ([place]) 12/30/2013   Hypertension    Family History:  see H&P  Family Psychiatric  History: see H&P  Social History:  Social History   Substance and Sexual Activity  Alcohol Use No     Social History   Substance and Sexual Activity  Drug Use No    Social History   Socioeconomic History   Marital status: Married    Spouse name: Not on file   Number of children: Not on file   Years of education: Not on file   Highest education level: Not on file  Occupational History   Not on file  Tobacco Use   Smoking status: Every Day   Smokeless tobacco: Current  Substance and Sexual Activity   Alcohol use: No   Drug use: No   Sexual activity: Not on file  Other Topics Concern   Not on file  Social History Narrative   Not on file   Social Determinants of Health   Financial Resource Strain: Not on file  Food Insecurity: Not on file  Transportation Needs: Not on file  Physical Activity: Not on file  Stress: Not on file  Social Connections: Not on file    Sleep: Good  Appetite:  Good  Current Medications: Current Facility-Administered Medications  Medication Dose Route Frequency Provider Last Rate Last Admin   alum & mag hydroxide-simeth (MAALOX/MYLANTA) 200-200-20 MG/5ML suspension 30 mL  30 mL Oral Q4H PRN Massengill, Nathan, MD       benztropine (COGENTIN) tablet 1 mg  1 mg Oral BID PRN Viann Fish E, MD   1 mg at 09/14/21 1254   benztropine mesylate (COGENTIN) injection 1 mg  1 mg Intramuscular BID PRN Nelda Marseille,  Akyla Vavrek E, MD       divalproex (DEPAKOTE ER) 24 hr tablet 1,000 mg  1,000 mg Oral QHS Nelda Marseille, Stephanie Mcglone E, MD   1,000 mg at 09/16/21 0606   docusate sodium (COLACE) capsule 100 mg  100 mg Oral Daily Nelda Marseille, Debhora Titus E, MD   100 mg at 09/16/21 0809   feeding supplement (KATE FARMS STANDARD 1.4) liquid 325 mL  325 mL Oral BID BM Massengill, [name], MD   325 mL at 09/15/21 1705   fluticasone (FLONASE) 50 MCG/ACT nasal spray 1 spray  1 spray Each Nare Daily Massengill, [name], MD   1 spray at 09/15/21 0830   hydrOXYzine  (ATARAX) tablet 25 mg  25 mg Oral TID PRN Janine Limbo, MD   25 mg at 09/10/21 1204   LORazepam (ATIVAN) tablet 1 mg  1 mg Oral BID Viann Fish E, MD   1 mg at 09/16/21 0809   LORazepam (ATIVAN) tablet 1 mg  1 mg Oral Q6H PRN Harlow Asa, MD       magnesium hydroxide (MILK OF MAGNESIA) suspension 30 mL  30 mL Oral Daily PRN Massengill, [name], MD       nicotine (NICODERM CQ - dosed in mg/24 hours) patch 14 mg  14 mg Transdermal Daily Nelda Marseille, Faviola Klare E, MD   14 mg at 09/15/21 0829   OLANZapine zydis (ZYPREXA) disintegrating tablet 10 mg  10 mg Oral BID AC & HS Nelda Marseille, Messi Twedt E, MD   10 mg at 09/16/21 0809   OLANZapine zydis (ZYPREXA) disintegrating tablet 5 mg  5 mg Oral Q8H PRN Viann Fish E, MD   5 mg at 09/14/21 1255   And   ziprasidone (GEODON) injection 20 mg  20 mg Intramuscular PRN Harlow Asa, MD        Lab Results:  Results for orders placed or performed during the hospital encounter of 09/06/21 (from the past 48 hour(s))  Ammonia     Status: None   Collection Time: 09/16/21  6:47 AM  Result Value Ref Range   Ammonia 32 9 - 35 umol/L    Comment: Performed at [facility], [place] [address], [place], [place] [address]  CBC with Differential/Platelet     Status: None   Collection Time: 09/16/21  6:47 AM  Result Value Ref Range   WBC 4.8 4.0 - 10.5 K/uL   RBC 4.37 3.87 - 5.11 MIL/uL   Hemoglobin 13.5 12.0 - 15.0 g/dL   HCT 41.6 36.0 - 46.0 %   MCV 95.2 80.0 - 100.0 fL   MCH 30.9 26.0 - 34.0 pg   MCHC 32.5 30.0 - 36.0 g/dL   RDW 14.0 11.5 - 15.5 %   Platelets 260 150 - 400 K/uL   nRBC 0.0 0.0 - 0.2 %   Neutrophils Relative % 53 %   Neutro Abs 2.5 1.7 - 7.7 K/uL   Lymphocytes Relative 35 %   Lymphs Abs 1.7 0.7 - 4.0 K/uL   Monocytes Relative 5 %   Monocytes Absolute 0.2 0.1 - 1.0 K/uL   Eosinophils Relative 6 %   Eosinophils Absolute 0.3 0.0 - 0.5 K/uL   Basophils Relative 0 %   Basophils Absolute 0.0 0.0 - 0.1 K/uL   Immature  Granulocytes 1 %   Abs Immature Granulocytes 0.03 0.00 - 0.07 K/uL    Comment: Performed at [facility], [place] [address] [place], [place] [address]     Blood Alcohol level:  Lab Results  Component Value Date   ETH <10 [id number]    Metabolic Disorder Labs: Lab Results  Component Value Date   HGBA1C 5.3 09/08/2021   MPG 105.41 09/08/2021   No results found for: PROLACTIN Lab Results  Component Value Date   CHOL 173 09/08/2021   TRIG 110 09/08/2021   HDL 50 09/08/2021   CHOLHDL 3.5 09/08/2021   VLDL 22 09/08/2021   LDLCALC 101 (H) 09/08/2021   LDLCALC 102 (H) 06/21/2020    Physical Findings:  Musculoskeletal: Strength & Muscle Tone: within normal limits Gait & Station: normal Patient leans: N/A  Psychiatric Specialty Exam:  Presentation  General Appearance: casually dressed, fair hygiene, wearing hair bonnet and mask   Eye Contact:Fair  Speech: rambling but less pressured, English  Speech Volume: normal  Mood and Affect  Mood: sedated appearing, calm  Affect:polite, less labile   Thought Process  Thought Processes:Disorganized   Orientation: Oriented to self, month and city but not situation  Thought Content:Has ongoing grandiose delusions,denies AVH, paranoia, or ideas of reference and is not grossly responding to internal/external stimuli on exam; denies first rank symptoms; less paranoid appearing  Hallucinations: Denied  Ideas of Reference:Denied  Suicidal Thoughts: denied  Homicidal Thoughts:denied  Sensorium  Memory:Limited secondary to mania/psychosis  Judgment:Impaired  Insight: Poor  Executive Functions  Concentration: Poor  Attention Span:Poor  [place] secondary to psychosis/mania  Language:Fair   Psychomotor Activity  Psychomotor Activity:sedated appearing  Assets  Assets:Communication Skills; Physical Health   Sleep  8 hours   Physical Exam Vitals and nursing  note reviewed.  Constitutional:      Appearance: Normal appearance.  HENT:     Head: Normocephalic.  Pulmonary:     Effort: Pulmonary effort is normal.  Neurological:     General: No focal deficit present.     Mental Status: [name] alert.   Review of Systems  Constitutional:  Negative for fever.  Respiratory:  Negative for shortness of breath.   Cardiovascular:  Negative for chest pain.  Gastrointestinal:  Negative for constipation, diarrhea, nausea and vomiting.  Neurological:  Negative for headaches.  Blood pressure 105/75, pulse (!) 102, temperature 97.7 F (36.5 C), temperature source Oral, resp. rate 16, height 5\' 6"  (1.676 m), weight 72.6 kg, SpO2 (!) 89 %. Body mass index is 25.83 kg/m.   Treatment Plan Summary: Diagnoses / Active Problems: Bipolar I MRE manic severe with psychotic features  PLAN: Safety and Monitoring:  -- Converted to IVC admission  -- Daily contact with patient to assess and evaluate symptoms and progress in treatment  -- Patient's case to be discussed in multi-disciplinary team meeting  -- Observation Level : q15 minute checks  -- Vital signs:  q12 hours  -- Precautions: suicide, elopement, and assault  2. Psychiatric Diagnoses and Treatment:   Bipolar I MRE manic with psychotic features  --Continue  Zyprexa zydis 10mg  at 0800 and 10mg  moved to 1800 to ensure compliance  Lipid Panel: WNL except for LDL 101; HbgA1c: 5.3; QTc:480ms   -- Increase to VPA ER 1500mg  qhs for residual mania and moved dose to 1800 to ensure compliance Will need more meaningful discussion of psychoeducation about med as she clears CBC WNL today; AST 33 and ALT 37 today and VPA level 20 today which is unlikely accurate trough given inconsistencies with recent dosing (recheck VPA level, CBC and LFTs in 3-4 days after dose increase) NH3 level 32 today  -- Continue Ativan 1mg  bid for help with intrusive  behaviors/mania  -- Metabolic profile and EKG monitoring obtained while  on an atypical antipsychotic   -- Zyprexa/Geodon/ativan agitation protocol  -- Encouraged patient to participate in unit milieu and in scheduled group therapies     3. Medical Issues Being Addressed:   Mildly elevated creatinine   -- repeat CMP today shows creatinine 1.08 and po hydration encouraged   Elevated BhCG with negative urine pregnancy test  -- Repeat beta HCG is 5 and 2nd repeat UPT is negative- Gyn states this is post-menopausal change and we can proceed with start of VPA  4. Discharge Planning:   -- Social work and case management to assist with discharge planning and identification of hospital follow-up needs prior to discharge  -- Discharge Concerns: Need to establish a safety plan; Medication compliance and effectiveness  -- Discharge Goals: Return home with outpatient referrals for mental health follow-up including medication management/psychotherapy  Korynne Dols [name] Delay, MD, Alda Ponder

## 2021-09-16 NOTE — Plan of Care (Signed)

## 2021-09-16 NOTE — Progress Notes (Signed)
[facility] Group Notes:  (Nursing/MHT/Case Management/Adjunct)  Date:  09/16/2021  Time:  2000 Type of Therapy:   wrap up group  Participation Level:  Active  Participation Quality:  Appropriate, Attentive, Sharing, and Supportive  Affect:  Appropriate  Cognitive:  Disorganized  Insight:  Improving  Engagement in Group:  Engaged  Modes of Intervention:  Clarification, Education, and Support  Summary of Progress/Problems: Positive thinking and positive change were discussed.   Natalie Brown S 09/16/2021, 9:00 PM

## 2021-09-16 NOTE — Group Note (Addendum)
[facility] LCSW Group Therapy Note  Date/Time:  09/16/2021  11:00AM-12:00PM  Type of Therapy and Topic:  Group Therapy:  Music and Mood  Participation Level:  Minimal   Description of Group: In this process group, members listened to a variety of genres of music and identified that different types of music evoke different responses.  Patients were encouraged to identify music that was soothing for them and music that was energizing for them.  Patients discussed how this knowledge can help with wellness and recovery in various ways including managing depression and anxiety as well as encouraging healthy sleep habits.    Therapeutic Goals: Patients will explore the impact of different varieties of music on mood Patients will verbalize the thoughts they have when listening to different types of music Patients will identify music that is soothing to them as well as music that is energizing to them Patients will discuss how to use this knowledge to assist in maintaining wellness and recovery Patients will explore the use of music as a coping skill  Summary of Patient Progress:  At the beginning of group, patient expressed her mood was different.  At the end of group, patient expressed her mood was good and peaceful. She did not remain on task during group, had to be stopped from having side conversations about the Bible.    Therapeutic Modalities: Solution Focused Brief Therapy Activity   Ambrose Mantle, LCSW

## 2021-09-16 NOTE — Progress Notes (Signed)
   09/16/21 0800  Psych Admission Type (Psych Patients Only)  Admission Status Involuntary  Psychosocial Assessment  Patient Complaints Worrying  Eye Contact Fair  Facial Expression Anxious  Affect Preoccupied  Speech Soft  Interaction Cautious  Motor Activity Slow  Appearance/Hygiene Unremarkable  Behavior Characteristics Cooperative  Mood Preoccupied  Thought Process  Coherency Disorganized  Content Preoccupation  Delusions Paranoid  Perception WDL  Hallucination None reported or observed  Judgment WDL  Confusion None  Danger to Self  Current suicidal ideation? Denies  Agreement Not to Harm Self Yes  Description of Agreement Verbally contracts for safety  Danger to Others  Danger to Others None reported or observed

## 2021-09-16 NOTE — [facility] Group Notes (Signed)
Adult Psychoeducational Group Not Date:  09/16/2021 Time:  0900-1045 Group Topic/Focus: PROGRESSIVE RELAXATION. A group where deep breathing is taught and tensing and relaxation muscle groups is used. Imagery is used as well.  Pts are asked to imagine 3 pillars that hold them up when they are not able to hold themselves up and to share that with the group.  Participation Level:  did not attend Farha Dano A   

## 2021-09-17 ENCOUNTER — Encounter (HOSPITAL_COMMUNITY): Payer: Self-pay

## 2021-09-17 DIAGNOSIS — F3112 Bipolar disorder, current episode manic without psychotic features, moderate: Secondary | ICD-10-CM | POA: Diagnosis not present

## 2021-09-17 MED ORDER — LORAZEPAM 0.5 MG PO TABS
0.5000 mg | ORAL_TABLET | Freq: Two times a day (BID) | ORAL | Status: DC
  Administered 2021-09-17 – 2021-09-19 (×4): 0.5 mg via ORAL
  Filled 2021-09-17 (×4): qty 1

## 2021-09-17 MED ORDER — OLANZAPINE 10 MG PO TBDP
10.0000 mg | ORAL_TABLET | Freq: Every day | ORAL | Status: AC
  Administered 2021-09-17: 10 mg via ORAL
  Filled 2021-09-17: qty 1

## 2021-09-17 MED ORDER — OLANZAPINE 10 MG PO TBDP
20.0000 mg | ORAL_TABLET | Freq: Every day | ORAL | Status: DC
  Administered 2021-09-18 – 2021-09-19 (×2): 20 mg via ORAL
  Filled 2021-09-17 (×3): qty 2

## 2021-09-17 NOTE — Group Note (Signed)
[facility] LCSW Group Therapy Note   Group Date: 09/17/2021 Start Time: 1030 End Time: 1100   Type of Therapy/Topic:  Group Therapy:  Emotion Regulation  Participation Level:  Minimal   Mood:  Description of Group:    The purpose of this group is to assist patients in learning to regulate negative emotions and experience positive emotions. Patients will be guided to discuss ways in which they have been vulnerable to their negative emotions. These vulnerabilities will be juxtaposed with experiences of positive emotions or situations, and patients challenged to use positive emotions to combat negative ones. Special emphasis will be placed on coping with negative emotions in conflict situations, and patients will process healthy conflict resolution skills.  Therapeutic Goals: Patient will identify two positive emotions or experiences to reflect on in order to balance out negative emotions:  Patient will label two or more emotions that they find the most difficult to experience:  Patient will be able to demonstrate positive conflict resolution skills through discussion or role plays:   Summary of Patient Progress:   Patient was present for the entirety of group session. Patient participated in opening and closing remarks. However, patient did not contribute at all to the topic of discussion despite encouraged participation. Patient fell asleep during group.    Therapeutic Modalities:   Cognitive Behavioral Therapy Feelings Identification Dialectical Behavioral Therapy   [name], [place]

## 2021-09-17 NOTE — Progress Notes (Addendum)
Natalie Brown  09/17/2021 3:48 PM Natalie Brown  MRN:  [id number]  Chief Complaint: mania and psychosis  Reason for Admission:  Natalie Brown is a 56 y.o. female with a history of bipolar d/o, who was initially admitted for inpatient psychiatric hospitalization on 09/06/2021 for management of delusions and disorganized thinking. The patient is currently on Hospital Day 10.   Chart Review from last 24 hours: The patient's chart was reviewed and nursing notes were reviewed.The patient's case was discussed in multidisciplinary team meeting. Per nursing, she attended some groups but had trouble staying on task in group.She was able to go off unit to the cafeteria last night without incident.She has appeared sedated this morning. She had not acute behavioral issues noted. Per MAR she was compliant with scheduled medications and did not require as needed medications yesterday.  Information Obtained Today During Patient Interview: The patient was seen and evaluated on the unit.  She states that she is feeling sedated today and we discussed that I will start tapering down on her Ativan as she is showing signs interval improvement in her impulsivity and behavioral control on the unit.  She states that she slept well overnight and has a good appetite today.  She has had some constipation but states that she was able to move her bowels some of this morning.  She voices no other physical complaints other than concern that she could be developing some acne-like rash on her face.  On clinical exam no rash is evident and she was encouraged to wash her face well and to continue using Eucerin cream topically as needed.  [name] less rapid and pressured today but continues to be disorganized and delusional.  When asked why she is in the hospital she thinks she is here to for help with housing.  She has no insight into her mental health condition but is oriented to date.  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  She denies AVH, SI, HI, paranoia, ideas of reference or first rank symptoms.  During the middle of the interview she randomly gets up to take off her coat and then to adjust the heat and air in the room and does require redirection to reengage in the interview.  Principal Problem: Bipolar 1 disorder with moderate mania (HCC) Diagnosis: Active Problems:   Bipolar affective disorder, current episode manic with psychotic symptoms ([place])  Past Psychiatric History: see H&P  Past Medical History:  Past Medical History:  Diagnosis Date   Bipolar 1 disorder with moderate mania ([place]) 12/30/2013   Hypertension    Family History: see H&P  Family Psychiatric  History: see H&P  Social History:  Social History   Substance and Sexual Activity  Alcohol Use No     Social History   Substance and Sexual Activity  Drug Use No    Social History   Socioeconomic History   Marital status: Married    Spouse name: Not on file   Number of children: Not on file   Years of education: Not on file   Highest education level: Not on file  Occupational History   Not on file  Tobacco Use   Smoking status: Every Day   Smokeless tobacco: Current  Substance and Sexual Activity   Alcohol use: No   Drug use: No   Sexual  activity: Not on file  Other Topics Concern   Not on file  Social History Narrative   Not on file   Social Determinants of Health   Financial Resource Strain: Not on file  Food Insecurity: Not on file  Transportation Needs: Not on file  Physical Activity: Not on file  Stress: Not on file  Social Connections: Not on file    Sleep: Increased   Appetite:  Good  Current Medications: Current Facility-Administered Medications  Medication Dose Route Frequency Provider Last Rate Last Admin   alum & mag hydroxide-simeth  (MAALOX/MYLANTA) 200-200-20 MG/5ML suspension 30 mL  30 mL Oral Q4H PRN Massengill, [name], MD       benztropine (COGENTIN) tablet 1 mg  1 mg Oral BID PRN Harlow Asa, MD   1 mg at 09/14/21 1254   benztropine mesylate (COGENTIN) injection 1 mg  1 mg Intramuscular BID PRN Harlow Asa, MD       divalproex (DEPAKOTE ER) 24 hr tablet 1,500 mg  1,500 mg Oral QHS Nelda Marseille, Donnamae Muilenburg E, MD   1,500 mg at 09/16/21 1831   docusate sodium (COLACE) capsule 100 mg  100 mg Oral Daily Nelda Marseille, Prabhleen Montemayor E, MD   100 mg at 09/17/21 0801   feeding supplement (KATE FARMS STANDARD 1.4) liquid 325 mL  325 mL Oral BID BM Massengill, [name], MD   325 mL at 09/17/21 1424   fluticasone (FLONASE) 50 MCG/ACT nasal spray 1 spray  1 spray Each Nare Daily Massengill, [name], MD   1 spray at 09/17/21 0802   hydrOXYzine (ATARAX) tablet 25 mg  25 mg Oral TID PRN Janine Limbo, MD   25 mg at 09/10/21 1204   LORazepam (ATIVAN) tablet 1 mg  1 mg Oral BID Nelda Marseille, Jodeci Roarty E, MD   1 mg at 09/17/21 0800   LORazepam (ATIVAN) tablet 1 mg  1 mg Oral Q6H PRN Harlow Asa, MD       magnesium hydroxide (MILK OF MAGNESIA) suspension 30 mL  30 mL Oral Daily PRN Massengill, [name], MD       nicotine (NICODERM CQ - dosed in mg/24 hours) patch 14 mg  14 mg Transdermal Daily Nelda Marseille, Zarius Furr E, MD   14 mg at 09/17/21 0802   OLANZapine zydis (ZYPREXA) disintegrating tablet 10 mg  10 mg Oral Daily Nelda Marseille, Kaidan Harpster E, MD   10 mg at 09/17/21 0802   OLANZapine zydis (ZYPREXA) disintegrating tablet 10 mg  10 mg Oral QHS Nelda Marseille, Aja Bolander E, MD   10 mg at 09/16/21 1831   OLANZapine zydis (ZYPREXA) disintegrating tablet 5 mg  5 mg Oral Q8H PRN Harlow Asa, MD   5 mg at 09/14/21 1255   And   ziprasidone (GEODON) injection 20 mg  20 mg Intramuscular PRN Harlow Asa, MD        Lab Results:  Results for orders placed or performed during the hospital encounter of 09/06/21 (from the past 48 hour(s))  Ammonia     Status: None   Collection Time:  09/16/21  6:47 AM  Result Value Ref Range   Ammonia 32 9 - 35 umol/L    Comment: Performed at [facility], [place] [address] [place], [place] [address]  CBC with Differential/Platelet     Status: None   Collection Time: 09/16/21  6:47 AM  Result Value Ref Range   WBC 4.8 4.0 - 10.5 K/uL   RBC 4.37 3.87 - 5.11 MIL/uL   Hemoglobin 13.5 12.0 - 15.0  g/dL   HCT 41.6 36.0 - 46.0 %   MCV 95.2 80.0 - 100.0 fL   MCH 30.9 26.0 - 34.0 pg   MCHC 32.5 30.0 - 36.0 g/dL   RDW 14.0 11.5 - 15.5 %   Platelets 260 150 - 400 K/uL   nRBC 0.0 0.0 - 0.2 %   Neutrophils Relative % 53 %   Neutro Abs 2.5 1.7 - 7.7 K/uL   Lymphocytes Relative 35 %   Lymphs Abs 1.7 0.7 - 4.0 K/uL   Monocytes Relative 5 %   Monocytes Absolute 0.2 0.1 - 1.0 K/uL   Eosinophils Relative 6 %   Eosinophils Absolute 0.3 0.0 - 0.5 K/uL   Basophils Relative 0 %   Basophils Absolute 0.0 0.0 - 0.1 K/uL   Immature Granulocytes 1 %   Abs Immature Granulocytes 0.03 0.00 - 0.07 K/uL    Comment: Performed at [facility], [place] [address] [place], [place] [address]  Comprehensive metabolic panel     Status: Abnormal   Collection Time: 09/16/21  6:47 AM  Result Value Ref Range   Sodium 137 135 - 145 mmol/L   Potassium 4.1 3.5 - 5.1 mmol/L   Chloride 101 98 - 111 mmol/L   CO2 27 22 - 32 mmol/L   Glucose, Bld 131 (H) 70 - 99 mg/dL    Comment: Glucose reference range applies only to samples taken after fasting for at least 8 hours.   BUN 19 6 - 20 mg/dL   Creatinine, Ser 1.08 (H) 0.44 - 1.00 mg/dL   Calcium 8.6 (L) 8.9 - 10.3 mg/dL   Total Protein 7.3 6.5 - 8.1 g/dL   Albumin 3.6 3.5 - 5.0 g/dL   AST 33 15 - 41 U/L   ALT 37 0 - 44 U/L   Alkaline Phosphatase 70 38 - 126 U/L   Total Bilirubin 0.6 0.3 - 1.2 mg/dL   GFR, Estimated >60 >60 mL/min    Comment: (Brown) Calculated using the CKD-EPI Creatinine Equation (2021)    Anion gap 9 5 - 15    Comment: Performed at [facility],  [place] [address] [place], [place] [address]  Valproic acid level     Status: Abnormal   Collection Time: 09/16/21  6:47 AM  Result Value Ref Range   Valproic Acid Lvl 20 (L) 50.0 - 100.0 ug/mL    Comment: Performed at [facility], [place] [address], [place], [place] [address]     Blood Alcohol level:  Lab Results  Component Value Date   ETH <10 [id number]    Metabolic Disorder Labs: Lab Results  Component Value Date   HGBA1C 5.3 09/08/2021   MPG 105.41 09/08/2021   No results found for: PROLACTIN Lab Results  Component Value Date   CHOL 173 09/08/2021   TRIG 110 09/08/2021   HDL 50 09/08/2021   CHOLHDL 3.5 09/08/2021   VLDL 22 09/08/2021   LDLCALC 101 (H) 09/08/2021   LDLCALC 102 (H) 06/21/2020    Physical Findings:  Musculoskeletal: Strength & Muscle Tone: within normal limits Gait & Station: normal Patient leans: N/A  Psychiatric Specialty Exam:  Presentation  General Appearance: casually dressed, fair hygiene, wearing hair bonnet and multiple layers of clothing  Eye Contact:Fleeting  Speech: no longer pressured, English, less rambling today  Speech Volume: normal  Mood and Affect  Mood: sedated appearing, calm  Affect:polite, less labile   Thought Process  Thought Processes:Disorganized   Orientation: Oriented to self,  month and city but not situation  Thought Content:Has ongoing grandiose delusions,denies AVH, paranoia, or ideas of reference and is not grossly responding to internal/external stimuli on exam; denies first rank symptoms; less paranoid appearing  Hallucinations: Denied  Ideas of Reference:Denied  Suicidal Thoughts: denied  Homicidal Thoughts:denied  Sensorium  Memory:Limited secondary to mania/psychosis  Judgment:Impaired  Insight: Poor  Executive Functions  Concentration: Poor  Attention Span:Poor  [place] secondary to  psychosis/mania  Language:Fair   Psychomotor Activity  Psychomotor Activity:no stiffness, no cogwheeling, no tremor, AIMS 0   Assets  Assets:Communication Skills; Physical Health   Sleep  6.25 hours   Physical Exam Vitals and nursing Brown reviewed.  Constitutional:      Appearance: Normal appearance.  HENT:     Head: Normocephalic.  Pulmonary:     Effort: Pulmonary effort is normal.  Skin:    Comments: No rash noted to face  Neurological:     General: No focal deficit present.     Mental Status: [name] alert.   Review of Systems  Constitutional:  Negative for fever.  Respiratory:  Negative for shortness of breath.   Cardiovascular:  Negative for chest pain.  Gastrointestinal:  Positive for constipation. Negative for diarrhea, nausea and vomiting.  Skin:  Positive for rash.       Perceives is acne on face  Neurological:  Negative for headaches.  Blood pressure 126/84, pulse 87, temperature 97.6 F (36.4 C), temperature source Oral, resp. rate 20, height 5\' 6"  (1.676 m), weight 72.6 kg, SpO2 100 %. Body mass index is 25.83 kg/m.   Treatment Plan Summary: Diagnoses / Active Problems: Bipolar I MRE manic severe with psychotic features  PLAN: Safety and Monitoring:  -- Converted to IVC admission  -- Daily contact with patient to assess and evaluate symptoms and progress in treatment  -- Patient's case to be discussed in multi-disciplinary team meeting  -- Observation Level : q15 minute checks  -- Vital signs:  q12 hours  -- Precautions: suicide, elopement, and assault  -- Attempting off unit privileges and monitoring behavior  2. Psychiatric Diagnoses and Treatment:   Bipolar I MRE manic with psychotic features  --Due to daytime sedation, will continue Zyprexa 10mg  bid today and starting tomorrow change to 20mg  qhs Lipid Panel: WNL except for LDL 101; HbgA1c: 5.3; QTc:417ms   -- Continue VPA ER 1500mg  qhs for residual mania and moved dose to 1800 to ensure  compliance Will need more meaningful discussion of psychoeducation about med and need for birth control as she clears CBC WNL 5/28; AST 33 and ALT 37 on 5/28 and VPA level 20 on 5/28 -(recheck VPA level, CBC and LFTs in 3-4 days after dose increase) NH3 level 32 on 5/28  -- Reduce Ativan to 0.5 bid and gradually taper off due to sedation issues  -- Zyprexa/Geodon/ativan agitation protocol  -- Encouraged patient to participate in unit milieu and in scheduled group therapies     3. Medical Issues Being Addressed:   Mildly elevated creatinine   -- repeat CMP shows creatinine 1.08 and po hydration encouraged   Constipation  - Start colace 100mg  daily and encouraged PRN MOM   Elevated BhCG with negative urine pregnancy test  -- Repeat beta HCG is 5 and 2nd repeat UPT is negative- Gyn states this is post-menopausal change and we can proceed with start of VPA   Season allergies  - Start Claritin 10mg  daily PRN  4. Discharge Planning:   --  Social work and case management to assist with discharge planning and identification of hospital follow-up needs prior to discharge  -- Discharge Concerns: Need to establish a safety plan; Medication compliance and effectiveness  -- Discharge Goals: Return home with outpatient referrals for mental health follow-up including medication management/psychotherapy  Shelly Shoultz [name] Delay, MD, Alda Ponder

## 2021-09-17 NOTE — BH IP Treatment Plan (Signed)
Interdisciplinary Treatment and Diagnostic Plan Update  09/17/2021 Time of Session: 0830 ANISIA LEIJA MRN: [id number]  Principal Diagnosis: Bipolar 1 disorder with moderate mania (HCC)  Secondary Diagnoses: Active Problems:   Bipolar affective disorder, current episode manic with psychotic symptoms (HCC)   Current Medications:  Current Facility-Administered Medications  Medication Dose Route Frequency Provider Last Rate Last Admin   alum & mag hydroxide-simeth (MAALOX/MYLANTA) 200-200-20 MG/5ML suspension 30 mL  30 mL Oral Q4H PRN Massengill, Harrold Donath, MD       benztropine (COGENTIN) tablet 1 mg  1 mg Oral BID PRN Comer Locket, MD   1 mg at 09/14/21 1254   benztropine mesylate (COGENTIN) injection 1 mg  1 mg Intramuscular BID PRN Comer Locket, MD       divalproex (DEPAKOTE ER) 24 hr tablet 1,500 mg  1,500 mg Oral QHS Mason Jim, Amy E, MD   1,500 mg at 09/16/21 1831   docusate sodium (COLACE) capsule 100 mg  100 mg Oral Daily Comer Locket, MD   100 mg at 09/17/21 0801   feeding supplement (KATE FARMS STANDARD 1.4) liquid 325 mL  325 mL Oral BID BM Massengill, Harrold Donath, MD   325 mL at 09/17/21 1424   fluticasone (FLONASE) 50 MCG/ACT nasal spray 1 spray  1 spray Each Nare Daily Massengill, Harrold Donath, MD   1 spray at 09/17/21 0802   hydrOXYzine (ATARAX) tablet 25 mg  25 mg Oral TID PRN Phineas Inches, MD   25 mg at 09/10/21 1204   LORazepam (ATIVAN) tablet 0.5 mg  0.5 mg Oral BID Comer Locket, MD       LORazepam (ATIVAN) tablet 1 mg  1 mg Oral Q6H PRN Comer Locket, MD       magnesium hydroxide (MILK OF MAGNESIA) suspension 30 mL  30 mL Oral Daily PRN Massengill, Harrold Donath, MD       nicotine (NICODERM CQ - dosed in mg/24 hours) patch 14 mg  14 mg Transdermal Daily Mason Jim, Amy E, MD   14 mg at 09/17/21 0802   OLANZapine zydis (ZYPREXA) disintegrating tablet 10 mg  10 mg Oral QHS Comer Locket, MD       [START ON 09/18/2021] OLANZapine zydis (ZYPREXA) disintegrating tablet  20 mg  20 mg Oral QHS Singleton, Amy E, MD       OLANZapine zydis (ZYPREXA) disintegrating tablet 5 mg  5 mg Oral Q8H PRN Mason Jim, Amy E, MD   5 mg at 09/14/21 1255   And   ziprasidone (GEODON) injection 20 mg  20 mg Intramuscular PRN Comer Locket, MD       PTA Medications: Medications Prior to Admission  Medication Sig Dispense Refill Last Dose   CLARITIN 10 MG tablet Take 10 mg by mouth daily.      fluticasone (FLONASE) 50 MCG/ACT nasal spray Place 1 spray into both nostrils daily.      pseudoephedrine-guaifenesin (MUCINEX D) 60-600 MG 12 hr tablet Take 1 tablet by mouth every 12 (twelve) hours. 30 tablet 0     Patient Stressors: Health problems   Medication change or noncompliance    Patient Strengths: Capable of independent living  Printmaker for treatment/growth  Supportive family/friends   Treatment Modalities: Medication Management, Group therapy, Case management,  1 to 1 session with clinician, Psychoeducation, Recreational therapy.   Physician Treatment Plan for Primary Diagnosis: Bipolar 1 disorder with moderate mania (HCC) Long Term Goal(s): Improvement in symptoms so as ready for discharge  Short Term Goals: Ability to identify and develop effective coping behaviors will improve Ability to maintain clinical measurements within normal limits will improve Compliance with prescribed medications will improve Ability to identify changes in lifestyle to reduce recurrence of condition will improve Ability to verbalize feelings will improve Ability to disclose and discuss suicidal ideas Ability to demonstrate self-control will improve  Medication Management: Evaluate patient's response, side effects, and tolerance of medication regimen.  Therapeutic Interventions: 1 to 1 sessions, Unit Group sessions and Medication administration.  Evaluation of Outcomes: Progressing  Physician Treatment Plan for Secondary Diagnosis: Active Problems:    Bipolar affective disorder, current episode manic with psychotic symptoms (HCC)  Long Term Goal(s): Improvement in symptoms so as ready for discharge   Short Term Goals: Ability to identify and develop effective coping behaviors will improve Ability to maintain clinical measurements within normal limits will improve Compliance with prescribed medications will improve Ability to identify changes in lifestyle to reduce recurrence of condition will improve Ability to verbalize feelings will improve Ability to disclose and discuss suicidal ideas Ability to demonstrate self-control will improve     Medication Management: Evaluate patient's response, side effects, and tolerance of medication regimen.  Therapeutic Interventions: 1 to 1 sessions, Unit Group sessions and Medication administration.  Evaluation of Outcomes: Progressing   RN Treatment Plan for Primary Diagnosis: Bipolar 1 disorder with moderate mania (HCC) Long Term Goal(s): Knowledge of disease and therapeutic regimen to maintain health will improve  Short Term Goals: Ability to remain free from injury will improve, Ability to verbalize frustration and anger appropriately will improve, Ability to demonstrate self-control, Ability to participate in decision making will improve, Ability to verbalize feelings will improve, Ability to disclose and discuss suicidal ideas, Ability to identify and develop effective coping behaviors will improve, and Compliance with prescribed medications will improve  Medication Management: RN will administer medications as ordered by provider, will assess and evaluate patient's response and provide education to patient for prescribed medication. RN will report any adverse and/or side effects to prescribing provider.  Therapeutic Interventions: 1 on 1 counseling sessions, Psychoeducation, Medication administration, Evaluate responses to treatment, Monitor vital signs and CBGs as ordered, Perform/monitor  CIWA, COWS, AIMS and Fall Risk screenings as ordered, Perform wound care treatments as ordered.  Evaluation of Outcomes: Progressing   LCSW Treatment Plan for Primary Diagnosis: Bipolar 1 disorder with moderate mania (HCC) Long Term Goal(s): Safe transition to appropriate next level of care at discharge, Engage patient in therapeutic group addressing interpersonal concerns.  Short Term Goals: Engage patient in aftercare planning with referrals and resources, Increase social support, Increase ability to appropriately verbalize feelings, Increase emotional regulation, Facilitate acceptance of mental health diagnosis and concerns, Facilitate patient progression through stages of change regarding substance use diagnoses and concerns, Identify triggers associated with mental health/substance abuse issues, and Increase skills for wellness and recovery  Therapeutic Interventions: Assess for all discharge needs, 1 to 1 time with Social worker, Explore available resources and support systems, Assess for adequacy in community support network, Educate family and significant other(s) on suicide prevention, Complete Psychosocial Assessment, Interpersonal group therapy.  Evaluation of Outcomes: Progressing   Progress in Treatment: Attending groups: Yes. Participating in groups: No. Taking medication as prescribed: Yes. Toleration medication: Yes. Family/Significant other contact made: Yes, individual(s) contacted:  SPE completed with Joanette GulaJulia Walker, Mother.  Patient understands diagnosis: No. Discussing patient identified problems/goals with staff: Yes. Medical problems stabilized or resolved: Yes. Denies suicidal/homicidal ideation: Yes. Issues/concerns per patient self-inventory: Yes.  Other: none   New problem(s) identified: No, Describe:  none   New Short Term/Long Term Goal(s): Patient to work towards elimination of symptoms of psychosis, medication management for mood stabilization; elimination of  SI thoughts; development of comprehensive mental wellness plan.  Patient Goals:  No additional goals identified at this time. Patient to continue to work towards original goals identified in initial treatment team meeting. CSW will remain available to patient should they voice additional treatment goals.   Discharge Plan or Barriers: Patient is currently homeless, CSW to have further conversations with patient once more oriented.   Reason for Continuation of Hospitalization: Other; describe psychosis and disorganized thought/behaviors.   Estimated Length of Stay: 1-7 days   Last 3 [place] Suicide Severity Risk Score: Flowsheet Row Ad[facility] (Current) [facility] from 09/05/2021 in [facility] ED from [facility]/26/2020 in [facility]  C-SSRS RISK CATEGORY High Risk High Risk No Risk       Last PHQ 2/9 Scores:    05/26/2020    2:14 PM 09/03/2017    4:34 PM 08/07/2016    2:12 PM  Depression screen PHQ 2/9  Decreased Interest 3 3 3   Down, Depressed, Hopeless 3 1 2   PHQ - 2 Score 6 4 5   Altered sleeping 0 3 3  Tired, decreased energy 0 3 0  Change in appetite 3 3 0  Feeling bad or failure about yourself  0 3 1  Trouble concentrating 3 0 0  Moving slowly or fidgety/restless 3 0 1  Suicidal thoughts 0 0 0  PHQ-9 Score 15 16 10     Scribe for Treatment Team: 09/17/2021 5:56 PM

## 2021-09-17 NOTE — BHH Group Notes (Signed)
Adult Orientation Group Note  Date:  09/17/2021 Time:  9:29 AM  Group Topic/Focus:  Orientation:   The focus of this group is to educate the patient on the purpose and policies of crisis stabilization and provide a format to answer questions about their admission.  The group details unit policies and expectations of patients while admitted.  Participation Level:  Active  Participation Quality:  Appropriate  Affect:  Appropriate  Cognitive:  Appropriate  Insight: Appropriate  Engagement in Group:  Engaged  Modes of Intervention:  Education   Kern Reap 09/17/2021, 9:29 AM

## 2021-09-17 NOTE — BHH Group Notes (Signed)
Pt did not attend Relaxation group.

## 2021-09-17 NOTE — Progress Notes (Signed)
Adult Psychoeducational Group Note  Date:  09/17/2021 Time:  9:07 PM  Group Topic/Focus:  Wrap-Up Group:   The focus of this group is to help patients review their daily goal of treatment and discuss progress on daily workbooks.  Participation Level:  Did Not Attend  Participation Quality:  Did Not Attend  Affect:  Did Not Attend  Cognitive:  Did Not Attend  Insight: Did Not Attend  Engagement in Group:  Did Not Attend  Modes of Intervention:  Did Not Attend  Additional Comments:   Pt was encouraged to attend wrap up group but refused.  Vevelyn Pat 09/17/2021, 9:07 PM

## 2021-09-17 NOTE — Progress Notes (Signed)
   09/17/21 0807  Psych Admission Type (Psych Patients Only)  Admission Status Involuntary  Psychosocial Assessment  Patient Complaints Worrying;Anxiety  Eye Contact Fair  Facial Expression Anxious  Affect Preoccupied  Speech Soft  Interaction Cautious  Motor Activity Slow  Appearance/Hygiene Unremarkable  Behavior Characteristics Cooperative  Mood Preoccupied  Thought Process  Coherency Disorganized  Content Preoccupation  Delusions Paranoid  Perception WDL  Hallucination None reported or observed  Judgment WDL  Confusion None  Danger to Self  Current suicidal ideation? Denies  Agreement Not to Harm Self Yes  Description of Agreement Verbally contracts for safety  Danger to Others  Danger to Others None reported or observed

## 2021-09-17 NOTE — Plan of Care (Signed)

## 2021-09-18 DIAGNOSIS — F3112 Bipolar disorder, current episode manic without psychotic features, moderate: Secondary | ICD-10-CM | POA: Diagnosis not present

## 2021-09-18 NOTE — Progress Notes (Signed)
[facility] MD Progress Note  09/18/2021 2:29 PM Natalie Brown  MRN:  [id number]  Chief Complaint: mania and psychosis  Reason for Admission:  Natalie Brown is a 56 y.o. female with a history of bipolar d/o, who was initially admitted for inpatient psychiatric hospitalization on 09/06/2021 for management of delusions and disorganized thinking. The patient is currently on Hospital Day 10.   Chart Review from last 24 hours: The patient's chart was reviewed and nursing notes were reviewed.The patient's case was discussed in multidisciplinary team meeting.  Per staff report patient continues to be tangential and disorganized.  Per MAR she was compliant with scheduled medications and did not require as needed medications yesterday.  Information Obtained Today During Patient Interview: Upon evaluation this morning patient reports feeling good, denies side effect to medications, reports good sleep and appetite she primarily presents call and linear but as the interview goes on she started her ambulating and nonlinear manner with some racing thoughts noted but continued to be pleasant and calm in general she denies SI HI or AVH she reports good sleep and appetite and denies any history of alcohol or illicit drug use currently or in the past.  She feels better on medication since admission but unable to specify in what way.  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 Principal Problem: Bipolar 1 disorder with moderate mania (HCC) Diagnosis: Active Problems:   Bipolar affective disorder, current episode manic with psychotic symptoms (HCC)  Past Psychiatric History: see H&P  Past Medical History:  Past Medical History:  Diagnosis Date   Bipolar 1 disorder with moderate mania (HCC) 12/30/2013   Hypertension    Family History: see H&P  Family Psychiatric  History: see H&P  Social History:  Social History   Substance and Sexual  Activity  Alcohol Use No     Social History   Substance and Sexual Activity  Drug Use No    Social History   Socioeconomic History   Marital status: Married    Spouse name: Not on file   Number of children: Not on file   Years of education: Not on file   Highest education level: Not on file  Occupational History   Not on file  Tobacco Use   Smoking status: Every Day   Smokeless tobacco: Current  Substance and Sexual Activity   Alcohol use: No   Drug use: No   Sexual activity: Not on file  Other Topics Concern   Not on file  Social History Narrative   Not on file   Social Determinants of Health   Financial Resource Strain: Not on file  Food Insecurity: Not on file  Transportation Needs: Not on file  Physical Activity: Not on file  Stress: Not on file  Social Connections: Not on file    Sleep: Increased   Appetite:  Good  Current Medications: Current Facility-Administered Medications  Medication Dose Route Frequency Provider Last Rate Last Admin   alum & mag hydroxide-simeth (MAALOX/MYLANTA) 200-200-20 MG/5ML suspension 30 mL  30 mL Oral Q4H PRN Massengill, Nathan, MD       benztropine (COGENTIN) tablet 1 mg  1 mg Oral BID PRN Bartholomew Crews E, MD   1 mg at 09/14/21 1254   benztropine mesylate (COGENTIN) injection 1 mg  1 mg Intramuscular BID PRN Comer Locket, MD       divalproex (DEPAKOTE ER) 24 hr tablet 1,500 mg  1,500  mg Oral QHS Mason JimSingleton, Amy E, MD   1,500 mg at 09/17/21 2046   docusate sodium (COLACE) capsule 100 mg  100 mg Oral Daily Mason JimSingleton, Amy E, MD   100 mg at 09/18/21 0816   feeding supplement (KATE FARMS STANDARD 1.4) liquid 325 mL  325 mL Oral BID BM Massengill, Harrold DonathNathan, MD   325 mL at 09/18/21 0817   fluticasone (FLONASE) 50 MCG/ACT nasal spray 1 spray  1 spray Each Nare Daily Massengill, Harrold DonathNathan, MD   1 spray at 09/18/21 0816   hydrOXYzine (ATARAX) tablet 25 mg  25 mg Oral TID PRN Phineas InchesMassengill, Nathan, MD   25 mg at 09/10/21 1204   LORazepam  (ATIVAN) tablet 0.5 mg  0.5 mg Oral BID Bartholomew CrewsSingleton, Amy E, MD   0.5 mg at 09/18/21 0816   LORazepam (ATIVAN) tablet 1 mg  1 mg Oral Q6H PRN Comer LocketSingleton, Amy E, MD       magnesium hydroxide (MILK OF MAGNESIA) suspension 30 mL  30 mL Oral Daily PRN Massengill, Harrold DonathNathan, MD       nicotine (NICODERM CQ - dosed in mg/24 hours) patch 14 mg  14 mg Transdermal Daily Mason JimSingleton, Amy E, MD   14 mg at 09/18/21 0816   OLANZapine zydis (ZYPREXA) disintegrating tablet 20 mg  20 mg Oral QHS Singleton, Amy E, MD       OLANZapine zydis (ZYPREXA) disintegrating tablet 5 mg  5 mg Oral Q8H PRN Mason JimSingleton, Amy E, MD   5 mg at 09/14/21 1255   And   ziprasidone (GEODON) injection 20 mg  20 mg Intramuscular PRN Comer LocketSingleton, Amy E, MD        Lab Results:  No results found for this or any previous visit (from the past 48 hour(s)).    Blood Alcohol level:  Lab Results  Component Value Date   ETH <10 09/05/2021    Metabolic Disorder Labs: Lab Results  Component Value Date   HGBA1C 5.3 09/08/2021   MPG 105.41 09/08/2021   No results found for: PROLACTIN Lab Results  Component Value Date   CHOL 173 09/08/2021   TRIG 110 09/08/2021   HDL 50 09/08/2021   CHOLHDL 3.5 09/08/2021   VLDL 22 09/08/2021   LDLCALC 101 (H) 09/08/2021   LDLCALC 102 (H) 06/21/2020    Physical Findings:  Musculoskeletal: Strength & Muscle Tone: within normal limits Gait & Station: normal Patient leans: N/A  Psychiatric Specialty Exam:  Presentation  General Appearance: casually dressed, fair hygiene, wearing hair bonnet and multiple layers of clothing  Eye Contact:Fleeting  Speech: no longer pressured, English, less rambling today  Speech Volume: normal  Mood and Affect  Mood: calm  Affect:polite, less labile, much calmer  Thought Process  Thought Processes:Disorganized   Orientation: Oriented to self, month and city but not situation  Thought Content: Ongoing grandiosity, denies paranoia, denies SI HI or  AVH  Hallucinations: Denied  Ideas of Reference:Denied  Suicidal Thoughts: denied  Homicidal Thoughts:denied  Sensorium  Memory:Limited secondary to mania and racing thoughts  Judgment:Poor  Insight: Poor  Art therapistxecutive Functions  Concentration: Poor  Attention Span:Poor  Recall:Poor  Fund of Knowledge:Limited secondary to psychosis/mania  Language:Fair   Psychomotor Activity  Psychomotor Activity:no stiffness, no cogwheeling, no tremor, AIMS 0   Assets  Assets:Communication Skills; Physical Health   Sleep  7 hours   Physical Exam Vitals and nursing note reviewed.  Constitutional:      Appearance: Normal appearance.  HENT:     Head: Normocephalic.  Pulmonary:  Effort: Pulmonary effort is normal.  Skin:    Comments: No rash noted to face  Neurological:     General: No focal deficit present.     Mental Status: [name] alert.   Review of Systems  Constitutional:  Negative for fever.  Respiratory:  Negative for shortness of breath.   Cardiovascular:  Negative for chest pain.  Gastrointestinal:  Positive for constipation. Negative for diarrhea, nausea and vomiting.  Skin:  Positive for rash.       Perceives is acne on face  Neurological:  Negative for headaches.  Blood pressure 111/89, pulse 97, temperature 97.8 F (36.6 C), temperature source Oral, resp. rate 20, height 5\' 6"  (1.676 m), weight 72.6 kg, SpO2 100 %. Body mass index is 25.83 kg/m.   Treatment Plan Summary: Diagnoses / Active Problems: Bipolar I MRE manic severe with psychotic features  PLAN: Safety and Monitoring:  -- Converted to IVC admission  -- Daily contact with patient to assess and evaluate symptoms and progress in treatment  -- Patient's case to be discussed in multi-disciplinary team meeting  -- Observation Level : q15 minute checks  -- Vital signs:  q12 hours  -- Precautions: suicide, elopement, and assault  -- Attempting off unit privileges and monitoring behavior  2.  Psychiatric Diagnoses and Treatment:   Bipolar I MRE manic with psychotic features  --zyprexa 20mg  qhs Lipid Panel: WNL except for LDL 101; HbgA1c: 5.3; QTc:466ms   -- Continue VPA ER 1500mg  qhs for residual mania and moved dose to 1800 to ensure compliance Will need more meaningful discussion of psychoeducation about med and need for birth control as she clears CBC WNL 5/28; AST 33 and ALT 37 on 5/28 and VPA level 20 on 5/28 -(recheck VPA level, CBC and LFTs in 3-4 days after dose increase) NH3 level 32 on 5/28  -- Ativan to 0.5 bid and gradually taper off due to sedation issues  -- Zyprexa/Geodon/ativan agitation protocol  -- Encouraged patient to participate in unit milieu and in scheduled group therapies     3. Medical Issues Being Addressed:   Mildly elevated creatinine   -- repeat CMP shows creatinine 1.08 and po hydration encouraged   Constipation  - continue colace 100mg  daily and encouraged PRN MOM    Season allergies  - continue Claritin 10mg  daily PRN  4. Discharge Planning:   -- Social work and case management to assist with discharge planning and identification of hospital follow-up needs prior to discharge  -- Discharge Concerns: Need to establish a safety plan; Medication compliance and effectiveness  -- Discharge Goals: Return home with outpatient referrals for mental health follow-up including medication management/psychotherapy  Barrie Sigmund 6/28, MD, MD

## 2021-09-18 NOTE — Progress Notes (Signed)
   09/18/21 0515  Sleep  Number of Hours 5.5

## 2021-09-18 NOTE — Progress Notes (Signed)
   09/18/21 7989  Psych Admission Type (Psych Patients Only)  Admission Status Involuntary  Psychosocial Assessment  Patient Complaints Anxiety;Worrying  Eye Contact Fair  Facial Expression Anxious  Affect Preoccupied  Speech Soft  Interaction Cautious;Minimal  Motor Activity Slow  Appearance/Hygiene Unremarkable  Behavior Characteristics Cooperative  Mood Preoccupied  Aggressive Behavior  Effect No apparent injury  Thought Process  Coherency Circumstantial;Disorganized  Content Preoccupation  Delusions Paranoid  Perception WDL  Hallucination None reported or observed  Judgment WDL  Confusion WDL  Danger to Self  Current suicidal ideation? Denies  Agreement Not to Harm Self Yes  Description of Agreement Verbally Contracts for safety  Danger to Others  Danger to Others None reported or observed

## 2021-09-18 NOTE — Group Note (Signed)
Recreation Therapy Group Note   Group Topic:Health and Wellness  Group Date: 09/18/2021 Start Time: 0940 End Time: 1020 Facilitators: Victorino Sparrow, LRT,CTRS Location: [address] Day[address]   Goal Area(s) Addresses:  Patient will define components of whole wellness. Patient will verbalize benefit of whole wellness.  Group Description:  Exercise.  LRT and patients discussed the elements of wellness (physical, mental and spiritual) and how they work together.  LRT explained to patients how wellness is a part of having good self care.  After discussion, LRT explained to patients they were going to exercise for at least 30 minutes and that they would be charge of the types of exercises the group did.  LRT would go around the room and give each patient a turn leading an exercise.  Patients were encouraged to take breaks or get water if needed.    Affect/Mood: Appropriate   Participation Level: Engaged and Hyperverbal   Participation Quality: Maximum Cues   Behavior: Poor boundaries    Speech/Thought Process: Relevant   Insight: Moderate   Judgement: Moderate   Modes of Intervention: Music and Exercise   Patient Response to Interventions:  Engaged   Education Outcome:  Acknowledges education and In group clarification offered    Clinical Observations/Individualized Feedback: Pt was able to identify the element of wellness.  Pt was rambling about things that had nothing to do with the discussion.  Pt was very active during the actual exercises.  Pt needed a lot of redirection for talking over peers, not letting peers have their turn, imposing on others personal space trying to get them to do things the way she wanted them done and for constantly being disruptive.  Pt would calm down for a few minutes before going back to the previous behaviors.  Pt expressed she had fun but needed a lot of redirection.   Plan: Continue to engage patient in RT group sessions 2-3x/week.   Victorino Sparrow, LRT,CTRS 09/18/2021 11:57 AM

## 2021-09-18 NOTE — Progress Notes (Signed)
Patient denies SI/HI/A/VH and verbally contracted for safety. Stated that she was not able to enjoy her visit with her Mother she was sleepy she wanted to ask her Mother about her discharge. Patient in bed sleeping respirations noted. No S/S/ of distress.   Q 15 minutes safety checks ongoing. Patient remains safe.

## 2021-09-18 NOTE — Plan of Care (Signed)

## 2021-09-18 NOTE — [facility] Group Notes (Signed)
[facility] Group Notes:  (Nursing/MHT/Case Management/Adjunct)  Date:  09/18/2021  Time:  9:41 AM  Type of Therapy:   Orientation/Goals group  Participation Level:  Active  Participation Quality:  Appropriate  Affect:  Appropriate  Cognitive:  Appropriate  Insight:  Appropriate  Engagement in Group:  Engaged and Improving  Modes of Intervention:  Discussion, Education, Orientation, and Support  Summary of Progress/Problems: Pt goal is to get discharged.   Joanne Salah J Joyice Magda 09/18/2021, 9:41 AM

## 2021-09-19 ENCOUNTER — Inpatient Hospital Stay: Payer: Self-pay | Admitting: Physician Assistant

## 2021-09-19 DIAGNOSIS — F3112 Bipolar disorder, current episode manic without psychotic features, moderate: Secondary | ICD-10-CM | POA: Diagnosis not present

## 2021-09-19 MED ORDER — DIVALPROEX SODIUM ER 500 MG PO TB24
1500.0000 mg | ORAL_TABLET | Freq: Every day | ORAL | Status: DC
  Administered 2021-09-20 – 2021-09-22 (×4): 1500 mg via ORAL
  Filled 2021-09-19 (×8): qty 3

## 2021-09-19 NOTE — Group Note (Signed)
Recreation Therapy Group Note   Group Topic:Leisure Education  Group Date: 09/19/2021 Start Time: 1005 End Time: 1050 Facilitators: Caroll Rancher, LRT,CTRS Location: [address] Dayroom   Goal Area(s) Addresses:  Patient will successfully identify positive leisure and recreation activities.  Patient will acknowledge benefits of participation in healthy leisure activities post discharge.  Patient will actively work with peers toward a shared goal.   Group Description: Pictionary. In groups of 5-7, patients took turns trying to guess the picture being drawn on the board by their teammate.  If the team guessed the correct answer, they won a point.  If the team guessed wrong, the other team got a chance to steal the point. After several rounds of game play, the team with the most points were declared winners. Post-activity discussion reviewed benefits of positive recreation outlets: reducing stress, improving coping mechanisms, increasing self-esteem, and building larger support systems.   Affect/Mood: Appropriate   Participation Level: Engaged   Participation Quality: Independent   Behavior: Appropriate and Attentive    Speech/Thought Process: Relevant   Insight: Fair   Judgement: Fair    Modes of Intervention: Competitive Play   Patient Response to Interventions:  Engaged   Education Outcome:  Acknowledges education and In group clarification offered    Clinical Observations/Individualized Feedback: Pt was appropriate during group.  Pt had a hard time understanding the rules at first but then got it.  Pt also struggled with the concept of leisure even with explanations.  Pt did well with the activity, a lot of her answers were in line with what the pictures were but not quit the answer.  Pt was able to identify one leisure activity for her as writing in her journal.    Plan: Continue to engage patient in RT group sessions 2-3x/week.   Caroll Rancher, Antonietta Jewel 09/19/2021  2:31 PM

## 2021-09-19 NOTE — Group Note (Signed)
[facility] LCSW Group Therapy Note    Group Date: 09/19/2021 Start Time: 1300 End Time: 1400  Type of Therapy and Topic:  Group Therapy:  Overcoming Obstacles  Participation Level:  [facility] PARTICIPATION LEVEL: Active  Mood:  Description of Group:   In this group patients will be encouraged to explore what they see as obstacles to their own wellness and recovery. They will be guided to discuss their thoughts, feelings, and behaviors related to these obstacles. The group will process together ways to cope with barriers, with attention given to specific choices patients can make. Each patient will be challenged to identify changes they are motivated to make in order to overcome their obstacles. This group will be process-oriented, with patients participating in exploration of their own experiences as well as giving and receiving support and challenge from other group members.  Therapeutic Goals: 1. Patient will identify personal and current obstacles as they relate to admission. 2. Patient will identify barriers that currently interfere with their wellness or overcoming obstacles.  3. Patient will identify feelings, thought process and behaviors related to these barriers. 4. Patient will identify two changes they are willing to make to overcome these obstacles:    Summary of Patient Progress   Patient was disorganized in group and off topic.  Patient discussed that taking meds has been an obstacle in the past.  Patient was hard to redirect.    Therapeutic Modalities:   Cognitive Behavioral Therapy Solution Focused Therapy Motivational Interviewing Relapse Prevention Therapy   Amilee Janvier E Kimberley Dastrup, LCSW

## 2021-09-19 NOTE — Progress Notes (Signed)
Pt observed to be disorganized, confused and hyper-religious on interactions. Denies SI, HI, AVH and pain when assessed "I love the [place], I'm a good Christian. I never do anything to hurt myself or anyone. I'm a child of God, I'm a loving soul, God is always with me". Remains medication compliant, denies adverse drug reactions. Incontinent of bladder, assisted by writer to clean up. Visible in milieu for scheduled groups. Interacts appropriately with others, affect is congruent and forwards on interactions. Required interactions this shift as she believed she was being d/c, parked up her belongings and wanted to leave. Cooperative with redirections.  Safety checks maintained at Q 15 minutes intervals without outburst. Support, encouragement and reassurance provided to pt this shift. Pt tolerates all PO intake well. Safety maintained on unit.

## 2021-09-19 NOTE — Progress Notes (Signed)
[facility] MD Progress Note  09/19/2021 2:27 PM Natalie Brown  MRN:  [id number]  Chief Complaint: mania and psychosis  Reason for Admission:  Natalie Brown is a 56 y.o. female with a history of bipolar d/o, who was initially admitted for inpatient psychiatric hospitalization on 09/06/2021 for management of delusions and disorganized thinking. The patient is currently on Hospital Day 10.   Chart Review from last 24 hours: The patient's chart was reviewed and nursing notes were reviewed.The patient's case was discussed in multidisciplinary team meeting.  Per staff report patient continues to be tangential and disorganized.  Per MAR she was compliant with scheduled medications and did not require as needed medications yesterday.  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  [name] compliant with her medications and does report feeling sleepy this morning and presents sleeping.  Her medications were reviewed and she receives Depakote and Zyprexa at bedtime and Ativan twice daily 0.5 mg.    I spoke to patient's mother today she does report to visit with patient yesterday and she was sleeping during visiting hours when discussed with patient's mother patient multiple compression of she does report that at baseline patient sometimes does not make sense.  I did discuss with patient's mother having family meeting to interview patient during mother's presence to assess how far she from baseline.    Chart review indicates at time of admission patient reported suicidality as well as increased depression in addition to disorganized thought process and continued manic increased speech.  During today's  evaluation and yesterday's evaluation patient depression and suicidality seem to have improved she is much less high present time of admission but remains disorganized.    Principal Problem: Bipolar 1 disorder with moderate mania (HCC) Diagnosis: Active Problems:   Bipolar affective disorder, current episode manic with psychotic symptoms (HCC)  Past Psychiatric History: see H&P  Past Medical History:  Past Medical History:  Diagnosis Date   Bipolar 1 disorder with moderate mania (HCC) 12/30/2013   Hypertension    Family History: see H&P  Family Psychiatric  History: see H&P  Social History:  Social History   Substance and Sexual Activity  Alcohol Use No     Social History   Substance and Sexual Activity  Drug Use No    Social History   Socioeconomic History   Marital status: Married    Spouse name: Not on file   Number of children: Not on file   Years of education: Not on file   Highest education level: Not on file  Occupational History   Not on file  Tobacco Use   Smoking status: Every Day   Smokeless tobacco: Current  Substance and Sexual Activity   Alcohol use: No   Drug use: No   Sexual activity: Not on file  Other Topics Concern   Not on file  Social History Narrative   Not on file   Social Determinants of Health   Financial Resource Strain: Not on file  Food Insecurity: Not on file  Transportation Needs: Not on file  Physical Activity: Not on file  Stress: Not on file  Social Connections: Not on file    Sleep: Increased  Appetite:  Good  Current Medications: Current Facility-Administered Medications  Medication Dose Route Frequency Provider Last Rate Last Admin   alum & mag hydroxide-simeth (MAALOX/MYLANTA) 200-200-20 MG/5ML suspension 30 mL  30 mL Oral Q4H PRN Massengill, Harrold Donath, MD       benztropine (COGENTIN) tablet 1 mg  1 mg Oral BID PRN Comer Locket, MD   1 mg at 09/14/21 1254   benztropine mesylate (COGENTIN) injection 1 mg  1 mg  Intramuscular BID PRN Comer Locket, MD       divalproex (DEPAKOTE ER) 24 hr tablet 1,500 mg  1,500 mg Oral QHS Mason Jim, Amy E, MD   1,500 mg at 09/18/21 1708   docusate sodium (COLACE) capsule 100 mg  100 mg Oral Daily Mason Jim, Amy E, MD   100 mg at 09/19/21 0981   feeding supplement (KATE FARMS STANDARD 1.4) liquid 325 mL  325 mL Oral BID BM Massengill, Harrold Donath, MD   325 mL at 09/19/21 0940   fluticasone (FLONASE) 50 MCG/ACT nasal spray 1 spray  1 spray Each Nare Daily Massengill, Harrold Donath, MD   1 spray at 09/19/21 1914   hydrOXYzine (ATARAX) tablet 25 mg  25 mg Oral TID PRN Phineas Inches, MD   25 mg at 09/10/21 1204   LORazepam (ATIVAN) tablet 0.5 mg  0.5 mg Oral BID Bartholomew Crews E, MD   0.5 mg at 09/19/21 7829   LORazepam (ATIVAN) tablet 1 mg  1 mg Oral Q6H PRN Comer Locket, MD       magnesium hydroxide (MILK OF MAGNESIA) suspension 30 mL  30 mL Oral Daily PRN Massengill, Nathan, MD       nicotine (NICODERM CQ - dosed in mg/24 hours) patch 14 mg  14 mg Transdermal Daily Mason Jim, Amy E, MD   14 mg at 09/19/21 0942   OLANZapine zydis (ZYPREXA) disintegrating tablet 20 mg  20 mg Oral QHS Singleton, Amy E, MD   20 mg at 09/18/21 1708   OLANZapine zydis (ZYPREXA) disintegrating tablet 5 mg  5 mg Oral Q8H PRN Bartholomew Crews E, MD   5 mg at 09/14/21 1255   And   ziprasidone (GEODON) injection 20 mg  20 mg Intramuscular PRN Comer Locket, MD        Lab Results:  No results found for this or any previous visit (from the past 48 hour(s)).    Blood Alcohol level:  Lab Results  Component Value Date   ETH <10 09/05/2021    Metabolic Disorder Labs: Lab Results  Component Value Date   HGBA1C 5.3 09/08/2021   MPG 105.41 09/08/2021   No results found for: PROLACTIN Lab Results  Component Value Date   CHOL 173 09/08/2021   TRIG 110 09/08/2021   HDL 50 09/08/2021   CHOLHDL 3.5 09/08/2021   VLDL 22 09/08/2021   LDLCALC 101 (H) 09/08/2021   LDLCALC 102 (H) 06/21/2020     Physical Findings:  Musculoskeletal: Strength & Muscle Tone: within normal limits Gait & Station: normal Patient leans: N/A  Psychiatric Specialty Exam:  Presentation  General Appearance: casually dressed, fair hygiene, wearing hair bonnet and multiple layers of clothing  Eye Contact:Fleeting, limited eye contact  Speech: No pressured speech noted but some increased amount  Speech Volume: normal  Mood and Affect  Mood: calm  Affect:polite, less labile, much calmer  Thought Process  Thought Processes:Disorganized   Orientation: Oriented to self, month and city but not situation  Thought Content: Ongoing grandiosity, denies paranoia, denies SI  HI or AVH  Hallucinations: Denied  Ideas of Reference:Denied  Suicidal Thoughts: denied  Homicidal Thoughts:denied  Sensorium  Memory:Limited secondary to mania and racing thoughts  Judgment:Poor  Insight: Poor  Art therapist  Concentration: Poor, easily distracted  Attention Span:Poor  Recall:Poor  Fund of Knowledge:Limited secondary to psychosis/mania  Language:Fair   Psychomotor Activity  Psychomotor Activity:no stiffness, no cogwheeling, no tremor, AIMS 0   Assets  Assets:Communication Skills; Physical Health   Sleep  7 hours   Physical Exam Vitals and nursing note reviewed.  Constitutional:      Appearance: Normal appearance.  HENT:     Head: Normocephalic.  Pulmonary:     Effort: Pulmonary effort is normal.  Skin:    Comments: No rash noted to face  Neurological:     General: No focal deficit present.     Mental Status: [name] alert.   Review of Systems  Constitutional:  Negative for fever.  Respiratory:  Negative for shortness of breath.   Cardiovascular:  Negative for chest pain.  Gastrointestinal:  Positive for constipation. Negative for diarrhea, nausea and vomiting.  Skin:  Positive for rash.       Perceives is acne on face  Neurological:  Negative for headaches.   Blood pressure 130/68, pulse (!) 112, temperature 97.8 F (36.6 C), temperature source Oral, resp. rate 20, height 5\' 6"  (1.676 m), weight 72.6 kg, SpO2 100 %. Body mass index is 25.83 kg/m.   Treatment Plan Summary: Diagnoses / Active Problems: Bipolar I MRE manic severe with psychotic features  PLAN: Safety and Monitoring:  -- Converted to IVC admission  -- Daily contact with patient to assess and evaluate symptoms and progress in treatment  -- Patient's case to be discussed in multi-disciplinary team meeting  -- Observation Level : q15 minute checks  -- Vital signs:  q12 hours  -- Precautions: suicide, elopement, and assault  -- Attempting off unit privileges and monitoring behavior  2. Psychiatric Diagnoses and Treatment:   Bipolar I MRE manic with psychotic features  -- Continue Zyprexa 20 mg at bedtime for mood and psychosis Lipid Panel: WNL except for LDL 101; HbgA1c: 5.3; QTc:466ms   -- Change Depakote to 1500 mg at bedtime to be given at 8 or 9 PM to avoid sleepiness in the afternoon We will repeat Depakote level on 6/2 to ensure therapeutic level and no toxicity Will need more meaningful discussion of psychoeducation about med and need for birth control as she clears CBC WNL 5/28; AST 33 and ALT 37 on 5/28 and VPA level 20 on 5/28 -(recheck VPA level, CBC and LFTs in 3-4 days after dose increase) NH3 level 32 on 5/28  -- Discontinue scheduled Ativan secondary to sleepiness reported  -- Zyprexa/Geodon/ativan agitation protocol  -- Encouraged patient to participate in unit milieu and in scheduled group therapies     3. Medical Issues Being Addressed:   Mildly elevated creatinine   -- repeat CMP shows creatinine 1.08 and po hydration encouraged   Constipation  - continue colace 100mg  daily and encouraged PRN MOM    Season allergies  - continue Claritin 10mg  daily PRN  4. Discharge Planning:   -- Social work and case management to assist with discharge planning and  identification of hospital follow-up needs prior to discharge  -- Discharge Concerns: Need to establish a safety plan; Medication compliance and effectiveness Arrange family meeting the patient to be interviewed with mother present to assist to assist how close she to baseline  --  Discharge Goals: Return home with outpatient referrals for mental health follow-up including medication management/psychotherapy  Total Time Spent in Direct Patient Care:  I personally spent 35 minutes on the unit in direct patient care. The direct patient care time included face-to-face time with the patient, reviewing the patient's chart, communicating with other professionals, and coordinating care. Greater than 50% of this time was spent in counseling or coordinating care with the patient regarding goals of hospitalization, psycho-education, and discharge planning needs.   Tracey Stewart Abbott PaoAttiah, MD

## 2021-09-20 DIAGNOSIS — F3112 Bipolar disorder, current episode manic without psychotic features, moderate: Secondary | ICD-10-CM | POA: Diagnosis not present

## 2021-09-20 MED ORDER — HALOPERIDOL 5 MG PO TABS
5.0000 mg | ORAL_TABLET | Freq: Two times a day (BID) | ORAL | Status: DC
  Administered 2021-09-20 – 2021-09-21 (×2): 5 mg via ORAL
  Filled 2021-09-20 (×6): qty 1

## 2021-09-20 NOTE — Plan of Care (Signed)

## 2021-09-20 NOTE — Group Note (Signed)
Recreation Therapy Group Note   Group Topic:Healthy Decision Making  Group Date: 09/20/2021 Start Time: 1000 End Time: [id number] Facilitators: Victorino Sparrow, LRT,CTRS Location:  [address]   Goal Area(s) Addresses:  Patient will review and complete packet supporting identification of healthy leisure and recreation activities.   Activity: LRT provided pt a workbook reviewing leisure and its impact on emotional states and personal fulfillment. Pt were to complete packets at their own pace in their rooms.   Affect/Mood: N/A   Participation Level: N/A    Clinical Observations/Individualized Feedback: Due to precautions on the unit, group was not held in its usual manner.  Patients were given packets to complete in place of regular group format.    Plan: Continue to engage patient in RT group sessions 2-3x/week.   Victorino Sparrow, LRT,CTRS 09/20/2021 11:38 AM

## 2021-09-20 NOTE — Plan of Care (Signed)
  Problem: Education: Goal: Mental status will improve Outcome: Progressing   Problem: Education: Goal: Verbalization of understanding the information provided will improve Outcome: Progressing   Problem: Coping: Goal: Coping ability will improve Outcome: Progressing   Problem: Education: Goal: Emotional status will improve Outcome: Progressing.

## 2021-09-20 NOTE — Progress Notes (Signed)
   09/20/21 0822  Psych Admission Type (Psych Patients Only)  Admission Status Involuntary  Psychosocial Assessment  Patient Complaints Anxiety  Eye Contact Fair  Facial Expression Anxious  Affect Appropriate to circumstance  Interaction Assertive  Motor Activity Slow  Appearance/Hygiene Unremarkable  Behavior Characteristics Cooperative  Mood Anxious  Thought Process  Coherency Circumstantial  Content Preoccupation  Delusions Paranoid  Perception WDL  Hallucination None reported or observed  Judgment Impaired  Confusion Mild  Danger to Self  Current suicidal ideation? Denies  Agreement Not to Harm Self Yes  Description of Agreement Verbal  Danger to Others  Danger to Others None reported or observed

## 2021-09-20 NOTE — Progress Notes (Signed)
Patient did not attend wrap up group. 

## 2021-09-20 NOTE — Progress Notes (Signed)
[facility] MD Progress Note  09/20/2021 12:45 PM Natalie Brown  MRN:  [id number]  Chief Complaint: mania and psychosis  Reason for Admission:  Natalie Brown is a 56 y.o. female with a history of bipolar d/o, who was initially admitted for inpatient psychiatric hospitalization on 09/06/2021 for management of delusions and disorganized thinking. The patient is currently on Hospital Day 10.   Chart Review from last 24 hours: The patient's chart was reviewed and nursing notes were reviewed.The patient's case was discussed in multidisciplinary team meeting.  Per staff report patient continues to be tangential and disorganized.  Per MAR she was compliant with scheduled medications and did not require as needed medications yesterday. Per staff patient was noted to be disorganized during the day yesterday requiring a lot of redirection, compliant with her scheduled medications.  Information Obtained Today During Patient Interview: Patient was evaluated in her room this morning she reports feeling good she primarily is linear telling me she has not slept good last night after "I worked out doing yoga and some exercises in my room" but soon becomes very tangential with racing thoughts and flight of ideas talking about different subjects very hard to follow demonstrating pressured speech not responding to redirection to stop talking.  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  She reports good sleep last night.  She  denies side effects to medication.  I spoke to patient's mother again today.  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    Principal Problem: Bipolar 1 disorder with moderate mania (HCC) Diagnosis: Active Problems:   Bipolar affective disorder, current episode manic with psychotic symptoms (HCC)  Past Psychiatric History: see H&P  Past Medical History:  Past Medical History:  Diagnosis Date   Bipolar 1 disorder with moderate mania (HCC) 12/30/2013   Hypertension    Family History: see H&P  Family Psychiatric  History: see H&P  Social History:  Social History   Substance and Sexual Activity  Alcohol Use No     Social History   Substance and Sexual Activity  Drug Use No    Social History   Socioeconomic History   Marital status: Married    Spouse name: Not on file   Number of children: Not on file   Years of education: Not on file   Highest education level: Not on file  Occupational History   Not on file  Tobacco Use   Smoking status: Every Day   Smokeless tobacco: Current  Substance and Sexual Activity   Alcohol use: No   Drug use: No   Sexual activity: Not on file  Other Topics Concern   Not on file  Social History Narrative   Not  on file   Social Determinants of Health   Financial Resource Strain: Not on file  Food Insecurity: Not on file  Transportation Needs: Not on file  Physical Activity: Not on file  Stress: Not on file  Social Connections: Not on file    Sleep: Good  Appetite:  Good  Current Medications: Current Facility-Administered Medications  Medication Dose Route Frequency Provider Last Rate Last Admin   alum & mag hydroxide-simeth (MAALOX/MYLANTA) 200-200-20 MG/5ML suspension 30 mL  30 mL Oral Q4H PRN  Massengill, Nathan, MD       benztropine (COGENTIN) tablet 1 mg  1 mg Oral BID PRN Bartholomew Crews E, MD   1 mg at 09/14/21 1254   benztropine mesylate (COGENTIN) injection 1 mg  1 mg Intramuscular BID PRN Comer Locket, MD       divalproex (DEPAKOTE ER) 24 hr tablet 1,500 mg  1,500 mg Oral QHS Freeda Spivey, MD   1,500 mg at 09/20/21 5366   docusate sodium (COLACE) capsule 100 mg  100 mg Oral Daily Mason Jim, Amy E, MD   100 mg at 09/20/21 0816   feeding supplement (KATE FARMS STANDARD 1.4) liquid 325 mL  325 mL Oral BID BM Massengill, Harrold Donath, MD   325 mL at 09/20/21 0817   fluticasone (FLONASE) 50 MCG/ACT nasal spray 1 spray  1 spray Each Nare Daily Massengill, Nathan, MD   1 spray at 09/20/21 0816   hydrOXYzine (ATARAX) tablet 25 mg  25 mg Oral TID PRN Phineas Inches, MD   25 mg at 09/10/21 1204   LORazepam (ATIVAN) tablet 1 mg  1 mg Oral Q6H PRN Mason Jim, Amy E, MD       magnesium hydroxide (MILK OF MAGNESIA) suspension 30 mL  30 mL Oral Daily PRN Massengill, Nathan, MD       nicotine (NICODERM CQ - dosed in mg/24 hours) patch 14 mg  14 mg Transdermal Daily Mason Jim, Amy E, MD   14 mg at 09/20/21 0816   OLANZapine zydis (ZYPREXA) disintegrating tablet 20 mg  20 mg Oral QHS Singleton, Amy E, MD   20 mg at 09/19/21 1713   OLANZapine zydis (ZYPREXA) disintegrating tablet 5 mg  5 mg Oral Q8H PRN Bartholomew Crews E, MD   5 mg at 09/14/21 1255   And   ziprasidone (GEODON) injection 20 mg  20 mg Intramuscular PRN Comer Locket, MD        Lab Results:  No results found for this or any previous visit (from the past 48 hour(s)).    Blood Alcohol level:  Lab Results  Component Value Date   ETH <10 09/05/2021    Metabolic Disorder Labs: Lab Results  Component Value Date   HGBA1C 5.3 09/08/2021   MPG 105.41 09/08/2021   No results found for: PROLACTIN Lab Results  Component Value Date   CHOL 173 09/08/2021   TRIG 110 09/08/2021   HDL 50 09/08/2021   CHOLHDL 3.5 09/08/2021    VLDL 22 09/08/2021   LDLCALC 101 (H) 09/08/2021   LDLCALC 102 (H) 06/21/2020    Physical Findings:  Musculoskeletal: Strength & Muscle Tone: within normal limits Gait & Station: normal Patient leans: N/A  Psychiatric Specialty Exam:  Presentation  General Appearance: casually dressed, fair hygiene, wearing hair bonnet and multiple layers of clothing  Eye Contact:Fleeting, limited eye contact  Speech: Pressured speech today hard to redirect with increased amount  Speech Volume: normal  Mood and Affect  Mood: Labile  Affect: Labile mood and  affect Tangential, racing thought process, flight of ideas noted Thought Process  Thought Processes:Disorganized   Orientation: Oriented to self, month and city but not situation  Thought Content: Ongoing grandiosity, denies paranoia, denies SI HI or AVH  Hallucinations: Denied  Ideas of Reference:Denied  Suicidal Thoughts: denied  Homicidal Thoughts:denied  Sensorium  Memory:Limited secondary to mania and racing thoughts  Judgment:Poor  Insight: Poor  Executive Functions  Concentration: Poor  Attention Span:Poor  Recall:Poor  Fund of Knowledge:Limited secondary to psychosis/mania  Language:Fair   Psychomotor Activity  Psychomotor Activity:no stiffness, no cogwheeling, no tremor, AIMS 0   Assets  Assets:Communication Skills; Physical Health   Sleep  7 hours   Physical Exam Vitals and nursing note reviewed.  Constitutional:      Appearance: Normal appearance.  HENT:     Head: Normocephalic.  Pulmonary:     Effort: Pulmonary effort is normal.  Skin:    Comments: No rash noted to face  Neurological:     General: No focal deficit present.     Mental Status: [name] alert.   Review of Systems  Constitutional:  Negative for fever.  Respiratory:  Negative for shortness of breath.   Cardiovascular:  Negative for chest pain.  Gastrointestinal:  Positive for constipation. Negative for diarrhea, nausea  and vomiting.  Skin:  Positive for rash.       Perceives is acne on face  Neurological:  Negative for headaches.  Blood pressure 119/79, pulse 92, temperature 98.9 F (37.2 C), temperature source Oral, resp. rate 18, height 5\' 6"  (1.676 m), weight 72.6 kg, SpO2 100 %. Body mass index is 25.83 kg/m.   Treatment Plan Summary: Diagnoses / Active Problems: Bipolar I MRE manic severe with psychotic features  PLAN: Safety and Monitoring:  -- Converted to IVC admission  -- Daily contact with patient to assess and evaluate symptoms and progress in treatment  -- Patient's case to be discussed in multi-disciplinary team meeting  -- Observation Level : q15 minute checks  -- Vital signs:  q12 hours  -- Precautions: suicide, elopement, and assault  -- Attempting off unit privileges and monitoring behavior  2. Psychiatric Diagnoses and Treatment:   Bipolar I MRE manic with psychotic features  -- Discontinue Zyprexa for lack of efficacy  -Start trial of haldol 5 mg bid to help with psyhcosis and mood stabilization, monitor for SE Lipid Panel: WNL except for LDL 101; HbgA1c: 5.3; QTc:47511ms   -- Change Depakote to 1500 mg at bedtime  We will repeat Depakote level on 6/2 to ensure therapeutic level and no toxicity Will need more meaningful discussion of psychoeducation about med and need for birth control as she clears CBC WNL 5/28; AST 33 and ALT 37 on 5/28 and VPA level 20 on 5/28 -(recheck VPA level, CBC and LFTs in 3-4 days after dose increase) NH3 level 32 on 5/28  -- Discontinue scheduled Ativan secondary to sleepiness reported  -- Zyprexa/Geodon/ativan agitation protocol  -- Encouraged patient to participate in unit milieu and in scheduled group therapies     3. Medical Issues Being Addressed:   Mildly elevated creatinine   -- repeat CMP shows creatinine 1.08 and po hydration encouraged   Constipation  - continue colace 100mg  daily and encouraged PRN MOM    Season allergies  -  continue Claritin 10mg  daily PRN  4. Discharge Planning:   -- Social work and case management to assist with discharge planning and identification of hospital follow-up needs prior to discharge  --  Discharge Concerns: Need to establish a safety plan; Medication compliance and effectiveness  -- Discharge Goals: Return home with outpatient referrals for mental health follow-up including medication management/psychotherapy  Total Time Spent in Direct Patient Care:  I personally spent 35 minutes on the unit in direct patient care. The direct patient care time included face-to-face time with the patient, reviewing the patient's chart, communicating with other professionals, and coordinating care. Greater than 50% of this time was spent in counseling or coordinating care with the patient regarding goals of hospitalization, psycho-education, and discharge planning needs.   Debbora Ang Abbott Pao, MD

## 2021-09-21 ENCOUNTER — Encounter (HOSPITAL_COMMUNITY): Payer: Self-pay

## 2021-09-21 DIAGNOSIS — F3112 Bipolar disorder, current episode manic without psychotic features, moderate: Secondary | ICD-10-CM | POA: Diagnosis not present

## 2021-09-21 MED ORDER — NICOTINE POLACRILEX 2 MG MT GUM
2.0000 mg | CHEWING_GUM | OROMUCOSAL | Status: DC | PRN
  Administered 2021-09-22 – 2021-11-20 (×19): 2 mg via ORAL
  Filled 2021-09-21 (×13): qty 1

## 2021-09-21 MED ORDER — HALOPERIDOL 5 MG PO TABS
5.0000 mg | ORAL_TABLET | Freq: Three times a day (TID) | ORAL | Status: DC
  Administered 2021-09-21 – 2021-09-24 (×8): 5 mg via ORAL
  Filled 2021-09-21 (×11): qty 1

## 2021-09-21 NOTE — Progress Notes (Addendum)
[facility] MD Progress Note  09/21/2021 1:47 PM Natalie Brown  MRN:  [id number]  Chief Complaint: mania and psychosis  Reason for Admission:  Natalie Brown is a 56 y.o. female with a history of bipolar d/o, who was initially admitted for inpatient psychiatric hospitalization on 09/06/2021 for management of delusions and disorganized thinking. The patient is currently on Hospital Day 10.   Chart Review from last 24 hours: The patient's chart was reviewed and nursing notes were reviewed.The patient's case was discussed in multidisciplinary team meeting.  Per staff report patient continues to be tangential and disorganized.  Per MAR she was compliant with scheduled medications and did not require as needed medications yesterday. Per staff patient was noted to be disorganized during the day yesterday requiring a lot of redirection, compliant with her scheduled medications.  Information Obtained Today During Patient Interview: Patient was evaluated in her room this morning she does present less hyper than yesterday, speech remains pushed but at least she is redirectable, stops and listen when asked.  She denies side effect to current medications and does not display any signs of EPS upon exam.  She continues to deny SI HI or AVH  She denies racing thoughts but continues to present with racing thoughts and flight of ideas but improved since yesterday.  Reports fair sleep and appetite, denies side effect of medication including Haldol started yesterday. Patient reports NicoDerm patch causing itching and requested to be discontinued, patient reports smoking 1 pack weekly and agrees to use Nicorette gum as needed instead.  Principal Problem: Bipolar 1 disorder with moderate mania (HCC) Diagnosis: Active Problems:   Bipolar affective disorder, current episode manic with psychotic symptoms (HCC)  Past Psychiatric History: see H&P  Past Medical History:  Past Medical History:  Diagnosis Date   Bipolar 1  disorder with moderate mania (HCC) 12/30/2013   Hypertension    Family History: see H&P  Family Psychiatric  History: see H&P  Social History:  Social History   Substance and Sexual Activity  Alcohol Use No     Social History   Substance and Sexual Activity  Drug Use No    Social History   Socioeconomic History   Marital status: Married    Spouse name: Not on file   Number of children: Not on file   Years of education: Not on file   Highest education level: Not on file  Occupational History   Not on file  Tobacco Use   Smoking status: Every Day   Smokeless tobacco: Current  Substance and Sexual Activity   Alcohol use: No   Drug use: No   Sexual activity: Not on file  Other Topics Concern   Not on file  Social History Narrative   Not on file   Social Determinants of Health   Financial Resource Strain: Not on file  Food Insecurity: Not on file  Transportation Needs: Not on file  Physical Activity: Not on file  Stress: Not on file  Social Connections: Not on file    Sleep: Good  Appetite:  Good  Current Medications: Current Facility-Administered Medications  Medication Dose Route Frequency Provider Last Rate Last Admin   alum & mag hydroxide-simeth (MAALOX/MYLANTA) 200-200-20 MG/5ML suspension 30 mL  30 mL Oral Q4H PRN Massengill, Nathan, MD       benztropine (COGENTIN) tablet 1 mg  1 mg Oral BID PRN Bartholomew Crews E, MD   1 mg at 09/14/21 1254   benztropine mesylate (COGENTIN) injection 1 mg  1 mg Intramuscular BID PRN Comer Locket, MD       divalproex (DEPAKOTE ER) 24 hr tablet 1,500 mg  1,500 mg Oral QHS Rowdy Guerrini, MD   1,500 mg at 09/20/21 2116   docusate sodium (COLACE) capsule 100 mg  100 mg Oral Daily Mason Jim, Amy E, MD   100 mg at 09/21/21 2993   feeding supplement (KATE FARMS STANDARD 1.4) liquid 325 mL  325 mL Oral BID BM Massengill, Nathan, MD   325 mL at 09/21/21 1046   fluticasone (FLONASE) 50 MCG/ACT nasal spray 1 spray  1 spray Each  Nare Daily Massengill, Nathan, MD   1 spray at 09/21/21 0837   haloperidol (HALDOL) tablet 5 mg  5 mg Oral BID Abbott Pao, Lashe Oliveira, MD   5 mg at 09/21/21 7169   hydrOXYzine (ATARAX) tablet 25 mg  25 mg Oral TID PRN Phineas Inches, MD   25 mg at 09/10/21 1204   LORazepam (ATIVAN) tablet 1 mg  1 mg Oral Q6H PRN Comer Locket, MD       magnesium hydroxide (MILK OF MAGNESIA) suspension 30 mL  30 mL Oral Daily PRN Massengill, Nathan, MD       nicotine (NICODERM CQ - dosed in mg/24 hours) patch 14 mg  14 mg Transdermal Daily Mason Jim, Amy E, MD   14 mg at 09/21/21 0839   OLANZapine zydis (ZYPREXA) disintegrating tablet 5 mg  5 mg Oral Q8H PRN Bartholomew Crews E, MD   5 mg at 09/14/21 1255   And   ziprasidone (GEODON) injection 20 mg  20 mg Intramuscular PRN Comer Locket, MD        Lab Results:  No results found for this or any previous visit (from the past 48 hour(s)).    Blood Alcohol level:  Lab Results  Component Value Date   ETH <10 09/05/2021    Metabolic Disorder Labs: Lab Results  Component Value Date   HGBA1C 5.3 09/08/2021   MPG 105.41 09/08/2021   No results found for: PROLACTIN Lab Results  Component Value Date   CHOL 173 09/08/2021   TRIG 110 09/08/2021   HDL 50 09/08/2021   CHOLHDL 3.5 09/08/2021   VLDL 22 09/08/2021   LDLCALC 101 (H) 09/08/2021   LDLCALC 102 (H) 06/21/2020    Physical Findings:  Musculoskeletal: Strength & Muscle Tone: within normal limits Gait & Station: normal Patient leans: N/A  Psychiatric Specialty Exam:  Presentation  General Appearance: casually dressed, fair hygiene, wearing hair bonnet and multiple layers of clothing  Eye Contact:Fleeting, limited eye contact  Speech: Remains with increased pushed speech but redirectable when asked to stop and listen  Speech Volume: normal  Mood and Affect  Mood: Labile but improved  Affect: Labile mood and affect but improved Much less tangential, less racing thought process and  flight of ideas than yesterday Thought Process  Thought Processes: Less disorganized and slightly more linear  Orientation: Oriented to self, month and city but not situation  Thought Content: Ongoing grandiosity, denies paranoia, denies SI HI or AVH  Hallucinations: Denied  Ideas of Reference:Denied  Suicidal Thoughts: denied  Homicidal Thoughts:denied  Sensorium  Memory:Limited secondary to mania and racing thoughts  Judgment:Poor  Insight: Poor  Art therapist  Concentration: Poor easily distracted  Attention Span:Poor  Recall:Poor  Fund of Knowledge:Limited secondary to psychosis/mania  Language:Fair   Psychomotor Activity  Psychomotor Activity:no stiffness, no cogwheeling, no tremor, AIMS 0   Assets  Assets:Communication Skills; Physical Health  Sleep  7 hours   Physical Exam Vitals and nursing note reviewed.  Constitutional:      Appearance: Normal appearance.  HENT:     Head: Normocephalic.  Pulmonary:     Effort: Pulmonary effort is normal.  Skin:    Comments: No rash noted to face  Neurological:     General: No focal deficit present.     Mental Status: [name] alert.   Review of Systems  Constitutional:  Negative for fever.  Respiratory:  Negative for shortness of breath.   Cardiovascular:  Negative for chest pain.  Gastrointestinal:  Negative for diarrhea, nausea and vomiting.  Skin:        Perceives is acne on face  Neurological:  Negative for headaches.  Blood pressure 104/89, pulse (!) 106, temperature 98.6 F (37 C), temperature source Oral, resp. rate 18, height 5\' 6"  (1.676 m), weight 72.6 kg, SpO2 91 %. Body mass index is 25.83 kg/m.   Treatment Plan Summary: Diagnoses / Active Problems: Bipolar I MRE manic severe with psychotic features  PLAN: Safety and Monitoring:  -- Converted to IVC admission  -- Daily contact with patient to assess and evaluate symptoms and progress in treatment  -- Patient's case to be  discussed in multi-disciplinary team meeting  -- Observation Level : q15 minute checks  -- Vital signs:  q12 hours  -- Precautions: suicide, elopement, and assault  -- Attempting off unit privileges and monitoring behavior  2. Psychiatric Diagnoses and Treatment:   Bipolar I MRE manic with psychotic features  -- continue on Zyprexa discontinued yesterday for lack of effect history  -Continue trial of Haldol and titrate up to 5 mg 3 times daily to help with psyhcosis and mood stabilization, monitor for SE Lipid Panel: WNL except for LDL 101; HbgA1c: 5.3; QTc:43911ms   -- Continue Depakote to 1500 mg at bedtime  We will repeat Depakote level on 6/2 to ensure therapeutic level and no toxicity Will need more meaningful discussion of psychoeducation about med and need for birth control as she clears CBC WNL 5/28; AST 33 and ALT 37 on 5/28 and VPA level 20 on 5/28 -(recheck VPA level, CBC and LFTs in 3-4 days after dose increase) NH3 level 32 on 5/28  -- Discontinue scheduled Ativan secondary to sleepiness reported  -- Zyprexa/Geodon/ativan agitation protocol  -- Encouraged patient to participate in unit milieu and in scheduled group therapies     3. Medical Issues Being Addressed:   Mildly elevated creatinine   -- repeat CMP shows creatinine 1.08 and po hydration encouraged   Constipation  - continue colace 100mg  daily and encouraged PRN MOM    Season allergies  - continue Claritin 10mg  daily PRN  4. Discharge Planning:   -- Social work and case management to assist with discharge planning and identification of hospital follow-up needs prior to discharge  -- Discharge Concerns: Need to establish a safety plan; Medication compliance and effectiveness  -- Discharge Goals: Return home with outpatient referrals for mental health follow-up including medication management/psychotherapy  Total Time Spent in Direct Patient Care:  I personally spent 35 minutes on the unit in direct patient care.  The direct patient care time included face-to-face time with the patient, reviewing the patient's chart, communicating with other professionals, and coordinating care. Greater than 50% of this time was spent in counseling or coordinating care with the patient regarding goals of hospitalization, psycho-education, and discharge planning needs.   Kharis Lapenna Abbott PaoAttiah, MD

## 2021-09-21 NOTE — Group Note (Signed)
Type of Therapy and Topic:  Group Therapy: Urge Surfing and Triggers  Participation Level:  Active   Description of Group: Recognizing Triggers: Patients defined triggers and discussed the importance of recognizing their personal warning signs. Patients identified their own triggers and how they tend to cope with stressful situations. Patients discussed areas such as people, places, things, and thoughts that rigger certain emotions for them. CSW provided support to patients and discussed safety planning for when these triggers occur. Group participants had opportunities to share openly with the group and participate in a group discussion while providing support and feedback to their peers.   Therapeutic Goals:   1.  Patient will discuss what a trigger is 2. Patient will review urge surfing 3. Patient will identify unwanted patterns that have occurred due to urges connected with emotions  4. Patient will discuss coping skills to assist with urge surfing 5. Patient will identify 2 changed behaviors they can participate in to help with triggers and urges.    Summary of Patient Progress:    Patient actively participate in group.  Patient was disorganized and hard to redirect.  Patient did not seem to be able to understand group topic and often answered questions that were not related to group topic. Patient was able to identify triggers to be her mother and that she would get angry and have unwanted behaviors.   Therapeutic Modalities:  DBT Solution focused therapy  Shannell Mikkelsen E Christion Leonhard, LCSW

## 2021-09-21 NOTE — BH IP Treatment Plan (Signed)
Interdisciplinary Treatment and Diagnostic Plan Update  09/21/2021 Time of Session: 0830 Natalie Brown MRN: [id number]  Principal Diagnosis: Bipolar 1 disorder with moderate mania ([place])  Secondary Diagnoses: Active Problems:   Bipolar affective disorder, current episode manic with psychotic symptoms (HCC)   Current Medications:  Current Facility-Administered Medications  Medication Dose Route Frequency Provider Last Rate Last Admin   alum & mag hydroxide-simeth (MAALOX/MYLANTA) 200-200-20 MG/5ML suspension 30 mL  30 mL Oral Q4H PRN Massengill, [name], MD       benztropine (COGENTIN) tablet 1 mg  1 mg Oral BID PRN Harlow Asa, MD   1 mg at 09/14/21 1254   benztropine mesylate (COGENTIN) injection 1 mg  1 mg Intramuscular BID PRN Harlow Asa, MD       divalproex (DEPAKOTE ER) 24 hr tablet 1,500 mg  1,500 mg Oral QHS Winfred Leeds, Nadir, MD   1,500 mg at 09/21/21 2020   docusate sodium (COLACE) capsule 100 mg  100 mg Oral Daily Harlow Asa, MD   100 mg at 09/21/21 0962   feeding supplement (KATE FARMS STANDARD 1.4) liquid 325 mL  325 mL Oral BID BM Massengill, [name], MD   325 mL at 09/21/21 1455   fluticasone (FLONASE) 50 MCG/ACT nasal spray 1 spray  1 spray Each Nare Daily Massengill, [name], MD   1 spray at 09/21/21 8366   haloperidol (HALDOL) tablet 5 mg  5 mg Oral TID Dian Situ, MD   5 mg at 09/21/21 1755   hydrOXYzine (ATARAX) tablet 25 mg  25 mg Oral TID PRN Janine Limbo, MD   25 mg at 09/10/21 1204   LORazepam (ATIVAN) tablet 1 mg  1 mg Oral Q6H PRN Harlow Asa, MD       magnesium hydroxide (MILK OF MAGNESIA) suspension 30 mL  30 mL Oral Daily PRN Massengill, [name], MD       nicotine polacrilex (NICORETTE) gum 2 mg  2 mg Oral PRN Winfred Leeds, Nadir, MD       OLANZapine zydis (ZYPREXA) disintegrating tablet 5 mg  5 mg Oral Q8H PRN Nelda Marseille, Amy E, MD   5 mg at 09/14/21 1255   And   ziprasidone (GEODON) injection 20 mg  20 mg Intramuscular PRN Harlow Asa,  MD       PTA Medications: Medications Prior to Admission  Medication Sig Dispense Refill Last Dose   CLARITIN 10 MG tablet Take 10 mg by mouth daily.      fluticasone (FLONASE) 50 MCG/ACT nasal spray Place 1 spray into both nostrils daily.      pseudoephedrine-guaifenesin (MUCINEX D) 60-600 MG 12 hr tablet Take 1 tablet by mouth every 12 (twelve) hours. 30 tablet 0     Patient Stressors: Health problems   Medication change or noncompliance    Patient Strengths: Capable of independent living  Hydrographic surveyor for treatment/growth  Supportive family/friends   Treatment Modalities: Medication Management, Group therapy, Case management,  1 to 1 session with clinician, Psychoeducation, Recreational therapy.   Physician Treatment Plan for Primary Diagnosis: Bipolar 1 disorder with moderate mania ([name]) Long Term Goal(s): Improvement in symptoms so as ready for discharge   Short Term Goals: Ability to identify and develop effective coping behaviors will improve Ability to maintain clinical measurements within normal limits will improve Compliance with prescribed medications will improve Ability to identify changes in lifestyle to reduce recurrence of condition will improve Ability to verbalize feelings will improve Ability to disclose and discuss suicidal ideas  Ability to demonstrate self-control will improve  Medication Management: Evaluate patient's response, side effects, and tolerance of medication regimen.  Therapeutic Interventions: 1 to 1 sessions, Unit Group sessions and Medication administration.  Evaluation of Outcomes: Not Met  Physician Treatment Plan for Secondary Diagnosis: Active Problems:   Bipolar affective disorder, current episode manic with psychotic symptoms ([name])  Long Term Goal(s): Improvement in symptoms so as ready for discharge   Short Term Goals: Ability to identify and develop effective coping behaviors will improve Ability to maintain  clinical measurements within normal limits will improve Compliance with prescribed medications will improve Ability to identify changes in lifestyle to reduce recurrence of condition will improve Ability to verbalize feelings will improve Ability to disclose and discuss suicidal ideas Ability to demonstrate self-control will improve     Medication Management: Evaluate patient's response, side effects, and tolerance of medication regimen.  Therapeutic Interventions: 1 to 1 sessions, Unit Group sessions and Medication administration.  Evaluation of Outcomes: Not Met   RN Treatment Plan for Primary Diagnosis: Bipolar 1 disorder with moderate mania ([name]) Long Term Goal(s): Knowledge of disease and therapeutic regimen to maintain health will improve  Short Term Goals: Ability to remain free from injury will improve, Ability to verbalize frustration and anger appropriately will improve, Ability to demonstrate self-control, Ability to participate in decision making will improve, Ability to verbalize feelings will improve, Ability to disclose and discuss suicidal ideas, Ability to identify and develop effective coping behaviors will improve, and Compliance with prescribed medications will improve  Medication Management: RN will administer medications as ordered by provider, will assess and evaluate patient's response and provide education to patient for prescribed medication. RN will report any adverse and/or side effects to prescribing provider.  Therapeutic Interventions: 1 on 1 counseling sessions, Psychoeducation, Medication administration, Evaluate responses to treatment, Monitor vital signs and CBGs as ordered, Perform/monitor CIWA, COWS, AIMS and Fall Risk screenings as ordered, Perform wound care treatments as ordered.  Evaluation of Outcomes: Not Met   LCSW Treatment Plan for Primary Diagnosis: Bipolar 1 disorder with moderate mania ([name]) Long Term Goal(s): Safe transition to appropriate  next level of care at discharge, Engage patient in therapeutic group addressing interpersonal concerns.  Short Term Goals: Engage patient in aftercare planning with referrals and resources, Increase social support, Increase ability to appropriately verbalize feelings, Increase emotional regulation, Facilitate acceptance of mental health diagnosis and concerns, Facilitate patient progression through stages of change regarding substance use diagnoses and concerns, Identify triggers associated with mental health/substance abuse issues, and Increase skills for wellness and recovery  Therapeutic Interventions: Assess for all discharge needs, 1 to 1 time with Social worker, Explore available resources and support systems, Assess for adequacy in community support network, Educate family and significant other(s) on suicide prevention, Complete Psychosocial Assessment, Interpersonal group therapy.  Evaluation of Outcomes: Not Met   Progress in Treatment: Attending groups: Yes. Participating in groups: Yes. Albeit, patient continues to be disruptive and tangential in group.  Taking medication as prescribed: Yes. Toleration medication: No. Family/Significant other contact made: Yes, individual(s) contacted:  SPE completed with Mother, Truddie Hidden [phone]) and Emali Heyward, sister, [phone] Patient understands diagnosis: No. Discussing patient identified problems/goals with staff: Yes. Medical problems stabilized or resolved: Yes. Denies suicidal/homicidal ideation: Yes. Issues/concerns per patient self-inventory: Yes. Other: none  New problem(s) identified: No, Describe:  none   New Short Term/Long Term Goal(s): Patient to work towards elimination of symptoms of psychosis, medication management for mood stabilization development of comprehensive mental  wellness plan.  Patient Goals No additional goals identified at this time. Patient to continue to work towards original goals identified  in initial treatment team meeting. CSW will remain available to patient should they voice additional treatment goals.   Discharge Plan or Barriers:  Patient s currently homeless, thoguh unable to engage in productive conversation, plan to continue treatment and discuss housing resources/options once patient's condition has improved.   Reason for Continuation of Hospitalization: Delusions , disorganized thought/behaviors   Estimated Length of Stay: TBD   Last [place] Suicide Severity Risk Score: Fate Admission (Current) from 09/06/2021 in [facility] ED from 09/05/2021 in [name] ED from 05/26/2020 in [facility] High Risk High Risk No Risk       Last PHQ 2/9 Scores:    05/26/2020    2:14 PM 09/03/2017    4:34 PM 08/07/2016    2:12 PM  Depression screen PHQ 2/9  Decreased Interest _0 Down, Depressed, Hopeless _1 PHQ - 2 Score _2 Altered sleeping 0 3 3  Tired, decreased energy 0 3 0  Change in appetite 3 3 0  Feeling bad or failure about yourself  0 3 1  Trouble concentrating 3 0 0  Moving slowly or fidgety/restless 3 0 1  Suicidal thoughts 0 0 0  PHQ-9 Score _3 Scribe for Treatment Team: Larose Kells 09/21/2021 8:35 PM

## 2021-09-21 NOTE — [facility] Group Notes (Signed)
Adult Orientation Group Note  Date:  09/21/2021 Time:  9:14 AM  Group Topic/Focus:  Orientation:   The focus of this group is to educate the patient on the purpose and policies of crisis stabilization and provide a format to answer questions about their admission.  The group details unit policies and expectations of patients while admitted.  Participation Level:  Active  Participation Quality:  Appropriate  Affect:  Appropriate  Cognitive:  Appropriate  Insight: Appropriate  Engagement in Group:  Engaged  Modes of Intervention:  Education  Thomas Hoff 09/21/2021, 9:14 AM

## 2021-09-21 NOTE — Group Note (Signed)
Recreation Therapy Group Note   Group Topic:Team Building  Group Date: 09/21/2021 Start Time: 1000 End Time: 1040 Facilitators: Caroll Rancher, LRT,CTRS Location: 500 [address] Dayroom   Goal Area(s) Addresses:  Patient will effectively work with peer towards shared goal.  Patient will identify skills used to make activity successful.  Patient will share challenges and verbalize solution-driven approaches used. Patient will identify how skills used during activity can be used to reach post d/c goals.   Group Description:  [employer]. Patients were provided the following materials: 2 drinking straws, 5 rubber bands, 5 paper clips, 2 index cards and 2 drinking cups. Using the provided materials patients were asked to build a launching mechanism to launch a ping pong ball across the room, approximately 10 feet. Patients were divided into teams of 3-5. Instructions required all materials be incorporated into the device, functionality of items left to the peer group's discretion.   Affect/Mood: N/A   Participation Level: Did not attend    Clinical Observations/Individualized Feedback:      Plan: Continue to engage patient in RT group sessions 2-3x/week.   Caroll Rancher, LRT,CTRS 09/21/2021 11:49 AM

## 2021-09-21 NOTE — Progress Notes (Signed)
Pt presents with bright affect, fair eye contact, pleasant mood and soft speech. Denies SI, HI, AVH and pain this shift. However, pt is still confused, disorganized. Unable to state her current location "I know it's [place], you know I know where I am. Don't ask me that. 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 Compliant with medications as ordered. Noted with hypersomnolence; slept for approximately 6 hours this shift.  Safety checks maintained at Q 15 minutes intervals without self harm gestures. All medications given with verbal education and effects monitored. Emotional support, reassurance and encouragement offered to pt throughout this shift. Pt is cooperative with care when awake, tolerates meals and fluids well. Denies discomfort at this time.

## 2021-09-22 DIAGNOSIS — F3112 Bipolar disorder, current episode manic without psychotic features, moderate: Secondary | ICD-10-CM | POA: Diagnosis not present

## 2021-09-22 LAB — CBC WITH DIFFERENTIAL/PLATELET
Abs Immature Granulocytes: 0.04 10*3/uL (ref 0.00–0.07)
Basophils Absolute: 0 10*3/uL (ref 0.0–0.1)
Basophils Relative: 1 %
Eosinophils Absolute: 0.3 10*3/uL (ref 0.0–0.5)
Eosinophils Relative: 7 %
HCT: 36.8 % (ref 36.0–46.0)
Hemoglobin: 11.9 g/dL — ABNORMAL LOW (ref 12.0–15.0)
Immature Granulocytes: 1 %
Lymphocytes Relative: 28 %
Lymphs Abs: 1.2 10*3/uL (ref 0.7–4.0)
MCH: 30.4 pg (ref 26.0–34.0)
MCHC: 32.3 g/dL (ref 30.0–36.0)
MCV: 93.9 fL (ref 80.0–100.0)
Monocytes Absolute: 0.3 10*3/uL (ref 0.1–1.0)
Monocytes Relative: 8 %
Neutro Abs: 2.5 10*3/uL (ref 1.7–7.7)
Neutrophils Relative %: 55 %
Platelets: 235 10*3/uL (ref 150–400)
RBC: 3.92 MIL/uL (ref 3.87–5.11)
RDW: 13.8 % (ref 11.5–15.5)
WBC: 4.4 10*3/uL (ref 4.0–10.5)
nRBC: 0 % (ref 0.0–0.2)

## 2021-09-22 LAB — HEPATIC FUNCTION PANEL
ALT: 18 U/L (ref 0–44)
AST: 22 U/L (ref 15–41)
Albumin: 3.5 g/dL (ref 3.5–5.0)
Alkaline Phosphatase: 59 U/L (ref 38–126)
Bilirubin, Direct: <0.1 mg/dL (ref 0.0–0.2)
Total Bilirubin: 0.6 mg/dL (ref 0.3–1.2)
Total Protein: 6.9 g/dL (ref 6.5–8.1)

## 2021-09-22 LAB — VALPROIC ACID LEVEL: Valproic Acid Lvl: 64 ug/mL (ref 50.0–100.0)

## 2021-09-22 NOTE — Group Note (Signed)
[facility] Group Notes: (Clinical Social Work)   09/22/2021      Type of Therapy:  Group Therapy   Participation Level:  Did Not Attend - was invited both individually by MHT and by overhead announcement, chose not to attend.   Riley Papin Grossman-Orr, LCSW 09/22/2021, 4:08 PM    

## 2021-09-22 NOTE — Progress Notes (Signed)
   09/21/21 2020  Psych Admission Type (Psych Patients Only)  Admission Status Involuntary  Psychosocial Assessment  Patient Complaints None  Eye Contact Fair  Facial Expression Animated  Affect Appropriate to circumstance;Preoccupied  Speech Soft  Interaction Assertive  Motor Activity Slow  Appearance/Hygiene Unremarkable  Behavior Characteristics Cooperative  Mood Preoccupied;Pleasant  Thought Process  Coherency Tangential;Flight of ideas  Content Preoccupation;Delusions  Delusions Paranoid;Grandeur  Perception WDL  Hallucination None reported or observed  Judgment Impaired  Confusion Moderate  Danger to Self  Current suicidal ideation? Denies  Agreement Not to Harm Self Yes  Description of Agreement Verbal Contract  Danger to Others  Danger to Others None reported or observed

## 2021-09-22 NOTE — Progress Notes (Signed)
[facility]  09/22/2021 3:11 PM Natalie Brown  MRN:  [id number]  Chief Complaint: mania and psychosis  Reason for Admission:  Natalie Brown is a 56 y.o. female with a history of bipolar d/o, who was initially admitted for inpatient psychiatric hospitalization on 09/06/2021 for management of delusions and disorganized thinking. The patient is currently on Hospital Day 10.   Chart Review from last 24 hours: The patient's chart was reviewed and nursing notes were reviewed.The patient's case was discussed in multidisciplinary team meeting.  Per staff report patient slept a lot during the day yesterday.  Per MAR she was compliant with scheduled medications and did not require as needed medications yesterday. Per staff patient was noted to be sleepy but reported to be less disorganized.  Information Obtained Today During Patient Interview: Patient was evaluated in her room this morning she does not present manic or euphoric and seems to have less racing thoughts and flight of ideas able to hold conversation, she reports she had diarrhea yesterday but none this morning, she reports fair sleep and appetite she continues to deny SI HI or AVH, she continues to present with some grandiosity regarding previous jobs and education.  She denies side effect to medications and does not display any sign of EPS during exam.  She presents to me today more linear and able to follow conversation with no racing thoughts or flight of ideas yet remains to be occasionally disorganized.  We will continue current medications same with no changes and obtain Depakote level tonight to ensure no toxicity, will follow.  Principal Problem: Bipolar 1 disorder with moderate mania (HCC) Diagnosis: Active Problems:   Bipolar affective disorder, current episode manic with psychotic symptoms (HCC)  Past Psychiatric History: see H&P  Past Medical History:  Past Medical History:  Diagnosis Date   Bipolar 1 disorder  with moderate mania (HCC) 12/30/2013   Hypertension    Family History: see H&P  Family Psychiatric  History: see H&P  Social History:  Social History   Substance and Sexual Activity  Alcohol Use No     Social History   Substance and Sexual Activity  Drug Use No    Social History   Socioeconomic History   Marital status: Married    Spouse name: Not on file   Number of children: Not on file   Years of education: Not on file   Highest education level: Not on file  Occupational History   Not on file  Tobacco Use   Smoking status: Every Day   Smokeless tobacco: Current  Substance and Sexual Activity   Alcohol use: No   Drug use: No   Sexual activity: Not on file  Other Topics Concern   Not on file  Social History Narrative   Not on file   Social Determinants of Health   Financial Resource Strain: Not on file  Food Insecurity: Not on file  Transportation Needs: Not on file  Physical Activity: Not on file  Stress: Not on file  Social Connections: Not on file    Sleep: Good  Appetite:  Good  Current Medications: Current Facility-Administered Medications  Medication Dose Route Frequency Provider Last Rate Last Admin   alum & mag hydroxide-simeth (MAALOX/MYLANTA) 200-200-20 MG/5ML suspension 30 mL  30 mL Oral Q4H PRN Massengill, Nathan, MD       benztropine (COGENTIN) tablet 1 mg  1 mg Oral BID PRN Comer LocketSingleton, Amy E, MD   1 mg at 09/14/21 1254  benztropine mesylate (COGENTIN) injection 1 mg  1 mg Intramuscular BID PRN Comer Locket, MD       divalproex (DEPAKOTE ER) 24 hr tablet 1,500 mg  1,500 mg Oral QHS Colby Reels, MD   1,500 mg at 09/21/21 2020   docusate sodium (COLACE) capsule 100 mg  100 mg Oral Daily Mason Jim, Amy E, MD   100 mg at 09/22/21 5883   feeding supplement (KATE FARMS STANDARD 1.4) liquid 325 mL  325 mL Oral BID BM Massengill, Harrold Donath, MD   325 mL at 09/22/21 0945   fluticasone (FLONASE) 50 MCG/ACT nasal spray 1 spray  1 spray Each Nare  Daily Massengill, Nathan, MD   1 spray at 09/22/21 2549   haloperidol (HALDOL) tablet 5 mg  5 mg Oral TID Sarita Bottom, MD   5 mg at 09/22/21 1139   hydrOXYzine (ATARAX) tablet 25 mg  25 mg Oral TID PRN Phineas Inches, MD   25 mg at 09/10/21 1204   LORazepam (ATIVAN) tablet 1 mg  1 mg Oral Q6H PRN Comer Locket, MD       magnesium hydroxide (MILK OF MAGNESIA) suspension 30 mL  30 mL Oral Daily PRN Massengill, Nathan, MD       nicotine polacrilex (NICORETTE) gum 2 mg  2 mg Oral PRN Abbott Pao, Enora Trillo, MD   2 mg at 09/22/21 0821   OLANZapine zydis (ZYPREXA) disintegrating tablet 5 mg  5 mg Oral Q8H PRN Bartholomew Crews E, MD   5 mg at 09/14/21 1255   And   ziprasidone (GEODON) injection 20 mg  20 mg Intramuscular PRN Comer Locket, MD        Lab Results:  No results found for this or any previous visit (from the past 48 hour(s)).    Blood Alcohol level:  Lab Results  Component Value Date   ETH <10 09/05/2021    Metabolic Disorder Labs: Lab Results  Component Value Date   HGBA1C 5.3 09/08/2021   MPG 105.41 09/08/2021   No results found for: PROLACTIN Lab Results  Component Value Date   CHOL 173 09/08/2021   TRIG 110 09/08/2021   HDL 50 09/08/2021   CHOLHDL 3.5 09/08/2021   VLDL 22 09/08/2021   LDLCALC 101 (H) 09/08/2021   LDLCALC 102 (H) 06/21/2020    Physical Findings:  Musculoskeletal: Strength & Muscle Tone: within normal limits Gait & Station: normal Patient leans: N/A  Psychiatric Specialty Exam:  Presentation  General Appearance: casually dressed, fair hygiene, wearing hair bonnet and multiple layers of clothing  Eye Contact:Fleeting, limited eye contact  Speech: Remains with increased pushed speech but redirectable when asked to stop and listen  Speech Volume: normal  Mood and Affect  Mood: Labile but improved  Affect: Labile mood and affect but improved Much less tangential, less racing thought process and flight of ideas  Thought Process   Thought Processes: Less disorganized and slightly more linear  Orientation: Oriented to self, month and city but not situation  Thought Content: Ongoing grandiosity, denies paranoia, denies SI HI or AVH  Hallucinations: Denied  Ideas of Reference:Denied  Suicidal Thoughts: denied  Homicidal Thoughts:denied  Sensorium  Memory:Limited secondary to mania and racing thoughts  Judgment:Poor  Insight: Poor  Art therapist  Concentration: Poor easily distracted  Attention Span:Poor  Recall:Poor  Fund of Knowledge:Limited secondary to psychosis/mania  Language:Fair   Psychomotor Activity  Psychomotor Activity:no stiffness, no cogwheeling, no tremor, AIMS 0   Assets  Assets:Communication Skills; Physical Health  Sleep  7 hours   Physical Exam Vitals and nursing note reviewed.  Constitutional:      Appearance: Normal appearance.  HENT:     Head: Normocephalic.  Pulmonary:     Effort: Pulmonary effort is normal.  Skin:    Comments: No rash noted to face  Neurological:     General: No focal deficit present.     Mental Status: [name] alert.   Review of Systems  Constitutional:  Negative for fever.  Respiratory:  Negative for shortness of breath.   Cardiovascular:  Negative for chest pain.  Gastrointestinal:  Negative for diarrhea, nausea and vomiting.  Skin:        Perceives is acne on face  Neurological:  Negative for headaches.  Blood pressure 115/79, pulse (!) 103, temperature 98.6 F (37 C), temperature source Oral, resp. rate 18, height 5\' 6"  (1.676 m), weight 72.6 kg, SpO2 100 %. Body mass index is 25.83 kg/m.   Treatment Plan Summary: Diagnoses / Active Problems: Bipolar I MRE manic severe with psychotic features  PLAN: Safety and Monitoring:  -- Converted to IVC admission  -- Daily contact with patient to assess and evaluate symptoms and progress in treatment  -- Patient's case to be discussed in multi-disciplinary team meeting  --  Observation Level : q15 minute checks  -- Vital signs:  q12 hours  -- Precautions: suicide, elopement, and assault  -- Attempting off unit privileges and monitoring behavior  2. Psychiatric Diagnoses and Treatment:   Bipolar I MRE manic with psychotic features  -- continue on Zyprexa discontinued yesterday for lack of effect history  -Continue Haldol 5 mg 3 times daily to help with psyhcosis and mood stabilization, monitor for SE Lipid Panel: WNL except for LDL 101; HbgA1c: 5.3; QTc:447ms   -- Continue Depakote to 1500 mg at bedtime  We will repeat Depakote level on 6/2 to ensure therapeutic level and no toxicity Will need more meaningful discussion of psychoeducation about med and need for birth control as she clears CBC WNL 5/28; AST 33 and ALT 37 on 5/28 and VPA level 20 on 5/28 -(recheck VPA level, CBC and LFTs in 3-4 days after dose increase) NH3 level 32 on 5/28  -- Ativan was discontinued few days ago to limit sedative agents.  -- Zyprexa/Geodon/ativan agitation protocol  -- Encouraged patient to participate in unit milieu and in scheduled group therapies     3. Medical Issues Being Addressed:   Mildly elevated creatinine   -- repeat CMP shows creatinine 1.08 and po hydration encouraged   Constipation  - continue colace 100mg  daily and encouraged PRN MOM    Season allergies  - continue Claritin 10mg  daily PRN  4. Discharge Planning:   -- Social work and case management to assist with discharge planning and identification of hospital follow-up needs prior to discharge  -- Discharge Concerns: Need to establish a safety plan; Medication compliance and effectiveness  -- Discharge Goals: Return home with outpatient referrals for mental health follow-up including medication management/psychotherapy  Total Time Spent in Direct Patient Care:  I personally spent 35 minutes on the unit in direct patient care. The direct patient care time included face-to-face time with the patient,  reviewing the patient's chart, communicating with other professionals, and coordinating care. Greater than 50% of this time was spent in counseling or coordinating care with the patient regarding goals of hospitalization, psycho-education, and discharge planning needs.   Able Malloy 6/28, MD

## 2021-09-22 NOTE — Progress Notes (Signed)
Adult Psychoeducational Group Note  Date:  09/22/2021 Time:  9:15 PM  Group Topic/Focus:  Wrap-Up Group:   The focus of this group is to help patients review their daily goal of treatment and discuss progress on daily workbooks.  Participation Level:  Did Not Attend    Wolfgang Phoenix 09/22/2021, 9:15 PM

## 2021-09-23 DIAGNOSIS — F3112 Bipolar disorder, current episode manic without psychotic features, moderate: Secondary | ICD-10-CM | POA: Diagnosis not present

## 2021-09-23 MED ORDER — DIVALPROEX SODIUM ER 500 MG PO TB24
2000.0000 mg | ORAL_TABLET | Freq: Every day | ORAL | Status: DC
  Administered 2021-09-23 – 2021-09-28 (×6): 2000 mg via ORAL
  Filled 2021-09-23 (×7): qty 4

## 2021-09-23 NOTE — Progress Notes (Signed)
Adult Psychoeducational Group Note  Date:  09/23/2021 Time:  8:25 PM  Group Topic/Focus:  Wrap-Up Group:   The focus of this group is to help patients review their daily goal of treatment and discuss progress on daily workbooks.  Participation Level:  Did Not Attend  Participation Quality:  Did Not Attend  Affect:  Did Not Attend  Cognitive:  Did Not Attend  Insight: Did Not Attend  Engagement in Group:  Did Not Attend  Modes of Intervention:  Did Not Attend  Additional Comments:   Pt was encouraged to attend group discussion but refused.   Vevelyn Pat 09/23/2021, 8:25 PM

## 2021-09-23 NOTE — Progress Notes (Signed)
[facility] MD Progress Note  09/23/2021 11:44 AM Natalie Brown  MRN:  [id number]  Chief Complaint: mania and psychosis  Reason for Admission:  Natalie Brown is a 56 y.o. female with a history of bipolar d/o, who was initially admitted for inpatient psychiatric hospitalization on 09/06/2021 for management of delusions and disorganized thinking. The patient is currently on Hospital Day 10.   Chart Review from last 24 hours: The patient's chart was reviewed and nursing notes were reviewed.The patient's case was discussed in multidisciplinary team meeting.  Per staff report patient slept a lot during the day yesterday.  Per MAR she was compliant with scheduled medications and did not require as needed medications yesterday. Per staff patient was noted to be sleepy but reported to be less disorganized. Trough Depakote level yesterday evening low therapeutic at 64, LFTs within normal limits, CBC within normal limits except for slightly low hemoglobin 11.9  Information Obtained Today During Patient Interview: Patient was evaluated in her room this morning she does not present manic or euphoric and seems to have improved with racing thoughts and no flight of ideas noted today, able to follow" fair conversation.  She reports diarrhea incident yesterday but denies any since then.  She continues to deny SI HI or AVH, she denies racing thoughts or feeling hyper or related, denies mood swings or easy irritability or crying spells.  She denies any symptoms and does not display any signs consistent with EPS, does not present sleepy during this evaluation.  She shows me her journal she is writing for today and it seems to show linear thought process about her waking up this morning bathing and calling her mother and eating breakfast.    We will continue current medications same with no changes except titrate Depakote further starting tonight and consider titrating Haldol in the next few days.  Principal Problem:  Bipolar 1 disorder with moderate mania (HCC) Diagnosis: Active Problems:   Bipolar affective disorder, current episode manic with psychotic symptoms (HCC)  Past Psychiatric History: see H&P  Past Medical History:  Past Medical History:  Diagnosis Date   Bipolar 1 disorder with moderate mania (HCC) 12/30/2013   Hypertension    Family History: see H&P  Family Psychiatric  History: see H&P  Social History:  Social History   Substance and Sexual Activity  Alcohol Use No     Social History   Substance and Sexual Activity  Drug Use No    Social History   Socioeconomic History   Marital status: Married    Spouse name: Not on file   Number of children: Not on file   Years of education: Not on file   Highest education level: Not on file  Occupational History   Not on file  Tobacco Use   Smoking status: Every Day   Smokeless tobacco: Current  Substance and Sexual Activity   Alcohol use: No   Drug use: No   Sexual activity: Not on file  Other Topics Concern   Not on file  Social History Narrative   Not on file   Social Determinants of Health   Financial Resource Strain: Not on file  Food Insecurity: Not on file  Transportation Needs: Not on file  Physical Activity: Not on file  Stress: Not on file  Social Connections: Not on file    Sleep: Good  Appetite:  Good  Current Medications: Current Facility-Administered Medications  Medication Dose Route Frequency Provider Last Rate Last Admin   alum &  mag hydroxide-simeth (MAALOX/MYLANTA) 200-200-20 MG/5ML suspension 30 mL  30 mL Oral Q4H PRN Massengill, Harrold Donath, MD       benztropine (COGENTIN) tablet 1 mg  1 mg Oral BID PRN Bartholomew Crews E, MD   1 mg at 09/14/21 1254   benztropine mesylate (COGENTIN) injection 1 mg  1 mg Intramuscular BID PRN Comer Locket, MD       divalproex (DEPAKOTE ER) 24 hr tablet 1,500 mg  1,500 mg Oral QHS Eyvonne Burchfield, MD   1,500 mg at 09/22/21 2105   docusate sodium (COLACE) capsule  100 mg  100 mg Oral Daily Mason Jim, Amy E, MD   100 mg at 09/23/21 0749   feeding supplement (KATE FARMS STANDARD 1.4) liquid 325 mL  325 mL Oral BID BM Massengill, Harrold Donath, MD   325 mL at 09/22/21 0945   fluticasone (FLONASE) 50 MCG/ACT nasal spray 1 spray  1 spray Each Nare Daily Massengill, Harrold Donath, MD   1 spray at 09/23/21 0749   haloperidol (HALDOL) tablet 5 mg  5 mg Oral TID Sarita Bottom, MD   5 mg at 09/23/21 0749   hydrOXYzine (ATARAX) tablet 25 mg  25 mg Oral TID PRN Phineas Inches, MD   25 mg at 09/10/21 1204   LORazepam (ATIVAN) tablet 1 mg  1 mg Oral Q6H PRN Comer Locket, MD       magnesium hydroxide (MILK OF MAGNESIA) suspension 30 mL  30 mL Oral Daily PRN Massengill, Nathan, MD       nicotine polacrilex (NICORETTE) gum 2 mg  2 mg Oral PRN Abbott Pao, Tylesha Gibeault, MD   2 mg at 09/22/21 0821   OLANZapine zydis (ZYPREXA) disintegrating tablet 5 mg  5 mg Oral Q8H PRN Bartholomew Crews E, MD   5 mg at 09/14/21 1255   And   ziprasidone (GEODON) injection 20 mg  20 mg Intramuscular PRN Comer Locket, MD        Lab Results:  Results for orders placed or performed during the hospital encounter of 09/06/21 (from the past 48 hour(s))  Valproic acid level     Status: None   Collection Time: 09/22/21  6:24 PM  Result Value Ref Range   Valproic Acid Lvl 64 50.0 - 100.0 ug/mL    Comment: Performed at [facility], [address] [place], [place] [address]  Hepatic function panel     Status: None   Collection Time: 09/22/21  6:24 PM  Result Value Ref Range   Total Protein 6.9 6.5 - 8.1 g/dL   Albumin 3.5 3.5 - 5.0 g/dL   AST 22 15 - 41 U/L   ALT 18 0 - 44 U/L   Alkaline Phosphatase 59 38 - 126 U/L   Total Bilirubin 0.6 0.3 - 1.2 mg/dL   Bilirubin, Direct <9.8 0.0 - 0.2 mg/dL   Indirect Bilirubin NOT CALCULATED 0.3 - 0.9 mg/dL    Comment: Performed at [facility], [address], [place], [place] [address]  CBC with Differential/Platelet     Status:  Abnormal   Collection Time: 09/22/21  6:24 PM  Result Value Ref Range   WBC 4.4 4.0 - 10.5 K/uL   RBC 3.92 3.87 - 5.11 MIL/uL   Hemoglobin 11.9 (L) 12.0 - 15.0 g/dL   HCT 05.3 97.6 - 73.4 %   MCV 93.9 80.0 - 100.0 fL   MCH 30.4 26.0 - 34.0 pg   MCHC 32.3 30.0 - 36.0 g/dL   RDW 19.3 79.0 -  15.5 %   Platelets 235 150 - 400 K/uL   nRBC 0.0 0.0 - 0.2 %   Neutrophils Relative % 55 %   Neutro Abs 2.5 1.7 - 7.7 K/uL   Lymphocytes Relative 28 %   Lymphs Abs 1.2 0.7 - 4.0 K/uL   Monocytes Relative 8 %   Monocytes Absolute 0.3 0.1 - 1.0 K/uL   Eosinophils Relative 7 %   Eosinophils Absolute 0.3 0.0 - 0.5 K/uL   Basophils Relative 1 %   Basophils Absolute 0.0 0.0 - 0.1 K/uL   Immature Granulocytes 1 %   Abs Immature Granulocytes 0.04 0.00 - 0.07 K/uL    Comment: Performed at [facility], [address], [place], [place] [address]      Blood Alcohol level:  Lab Results  Component Value Date   ETH <10 09/05/2021    Metabolic Disorder Labs: Lab Results  Component Value Date   HGBA1C 5.3 09/08/2021   MPG 105.41 09/08/2021   No results found for: PROLACTIN Lab Results  Component Value Date   CHOL 173 09/08/2021   TRIG 110 09/08/2021   HDL 50 09/08/2021   CHOLHDL 3.5 09/08/2021   VLDL 22 09/08/2021   LDLCALC 101 (H) 09/08/2021   LDLCALC 102 (H) 06/21/2020    Physical Findings:  Musculoskeletal: Strength & Muscle Tone: within normal limits Gait & Station: normal Patient leans: N/A  Psychiatric Specialty Exam:  Presentation  General Appearance: casually dressed, fair hygiene, wearing hair bonnet and multiple layers of clothing  Eye Contact:Fleeting, limited eye contact  Speech: Remains with increased pushed speech but redirectable when asked to stop and listen  Speech Volume: normal  Mood and Affect  Mood: Labile but improved  Affect: Labile mood and affect but improved Much less tangential, less racing thought process and flight of ideas   Thought Process  Thought Processes: Less disorganized and slightly more linear  Orientation: Oriented to self, month and city but not situation  Thought Content: Ongoing grandiosity, denies paranoia, denies SI HI or AVH  Hallucinations: Denied  Ideas of Reference:Denied  Suicidal Thoughts: denied  Homicidal Thoughts:denied  Sensorium  Memory:Limited secondary to mania and racing thoughts  Judgment:Poor  Insight: Poor  Art therapist  Concentration: Poor easily distracted  Attention Span:Poor  Recall:Poor  Fund of Knowledge:Limited secondary to psychosis/mania  Language:Fair   Psychomotor Activity  Psychomotor Activity:no stiffness, no cogwheeling, no tremor, AIMS 0   Assets  Assets:Communication Skills; Physical Health   Sleep  7 hours   Physical Exam Vitals and nursing note reviewed.  Constitutional:      Appearance: Normal appearance.  HENT:     Head: Normocephalic.  Pulmonary:     Effort: Pulmonary effort is normal.  Skin:    Comments: No rash noted to face  Neurological:     General: No focal deficit present.     Mental Status: [name] alert.   Review of Systems  Constitutional:  Negative for fever.  Respiratory:  Negative for shortness of breath.   Cardiovascular:  Negative for chest pain.  Gastrointestinal:  Negative for diarrhea, nausea and vomiting.  Skin:        Perceives is acne on face  Neurological:  Negative for headaches.  Blood pressure 101/70, pulse 84, temperature 98.3 F (36.8 C), temperature source Oral, resp. rate 18, height 5\' 6"  (1.676 m), weight 72.6 kg, SpO2 100 %. Body mass index is 25.83 kg/m.   Treatment Plan Summary: Diagnoses / Active Problems: Bipolar I MRE  manic severe with psychotic features  PLAN: Safety and Monitoring:  -- Converted to IVC admission  -- Daily contact with patient to assess and evaluate symptoms and progress in treatment  -- Patient's case to be discussed in multi-disciplinary team  meeting  -- Observation Level : q15 minute checks  -- Vital signs:  q12 hours  -- Precautions: suicide, elopement, and assault  -- Attempting off unit privileges and monitoring behavior  2. Psychiatric Diagnoses and Treatment:   Bipolar I MRE manic with psychotic features  -- continue on Zyprexa discontinued yesterday for lack of effect history  -Continue Haldol 5 mg 3 times daily to help with psyhcosis and mood stabilization, monitor for SE Lipid Panel: WNL except for LDL 101; HbgA1c: 5.3; QTc:41111ms   -- Titrate Depakote from 1500 to 2000 mg at bedtime  We will repeat Depakote level on 6/7 to ensure therapeutic level and no toxicity Will need more meaningful discussion of psychoeducation about med and need for birth control as she clears CBC WNL 5/28; AST 33 and ALT 37 on 5/28 and VPA level 20 on 5/28 -(recheck VPA level, CBC and LFTs in 3-4 days after dose increase) NH3 level 32 on 5/28  -- Ativan was discontinued few days ago to limit sedative agents.  -- Zyprexa/Geodon/ativan agitation protocol  -- Encouraged patient to participate in unit milieu and in scheduled group therapies     3. Medical Issues Being Addressed:   Mildly elevated creatinine   -- repeat CMP shows creatinine 1.08 and po hydration encouraged   Constipation  - continue colace 100mg  daily and encouraged PRN MOM    Season allergies  - continue Claritin 10mg  daily PRN  4. Discharge Planning:   -- Social work and case management to assist with discharge planning and identification of hospital follow-up needs prior to discharge  -- Discharge Concerns: Need to establish a safety plan; Medication compliance and effectiveness  -- Discharge Goals: Return home with outpatient referrals for mental health follow-up including medication management/psychotherapy  Total Time Spent in Direct Patient Care:  I personally spent 35 minutes on the unit in direct patient care. The direct patient care time included face-to-face  time with the patient, reviewing the patient's chart, communicating with other professionals, and coordinating care. Greater than 50% of this time was spent in counseling or coordinating care with the patient regarding goals of hospitalization, psycho-education, and discharge planning needs.   Desia Saban Abbott PaoAttiah, MD

## 2021-09-23 NOTE — Group Note (Signed)
[facility] LCSW Group Therapy Note  Date/Time:  09/23/2021  11:00AM-12:00PM  Type of Therapy and Topic:  Group Therapy:  Music and Mood  Participation Level:  Active   Description of Group: In this process group, members listened to a variety of genres of music and identified that different types of music evoke different responses.  Patients were encouraged to identify music that was soothing for them and music that was energizing for them.  Patients discussed how this knowledge can help with wellness and recovery in various ways including managing depression and anxiety as well as encouraging healthy sleep habits.    Therapeutic Goals: Patients will explore the impact of different varieties of music on mood Patients will verbalize the thoughts they have when listening to different types of music Patients will identify music that is soothing to them as well as music that is energizing to them Patients will discuss how to use this knowledge to assist in maintaining wellness and recovery Patients will explore the use of music as a coping skill  Summary of Patient Progress:  The patient talked a lot during group.  She was the only person for the first part of group.  CSW asked her what life will look like at discharge, where she will live, etc.  She stated she has to figure out a way to get an income so she can pay for a place to stay.  CSW spent time talking to her about applying for disability and what that could do for her.  She was quite receptive.  Another patent arrived and we listened to several different songs.  She could respond directly to CSW and appreciated the songs; however, she does not get along with the other patient and made disparaging remarks to him.  CSW sat between them when it looked like it was going to escalate, redirected them, then pointed out how similar they are in the needs in their lives.  They ultimately agreed to try to get alon.  Therapeutic Modalities: Solution Focused  Brief Therapy Activity   Ambrose Mantle, LCSW

## 2021-09-23 NOTE — Progress Notes (Signed)
   09/23/21 0900  Psych Admission Type (Psych Patients Only)  Admission Status Involuntary  Psychosocial Assessment  Patient Complaints None  Eye Contact Fair  Facial Expression Animated  Affect Appropriate to circumstance  Speech Soft  Interaction Assertive  Motor Activity Slow  Appearance/Hygiene Unremarkable  Behavior Characteristics Cooperative  Mood Pleasant  Thought Process  Coherency Tangential  Content Preoccupation;Delusions  Delusions None reported or observed  Perception WDL  Hallucination None reported or observed  Judgment Impaired  Confusion Moderate  Danger to Self  Current suicidal ideation? Denies  Agreement Not to Harm Self Yes  Description of Agreement Verbal  Danger to Others  Danger to Others None reported or observed

## 2021-09-23 NOTE — Progress Notes (Signed)
   09/22/21 2300  Psych Admission Type (Psych Patients Only)  Admission Status Involuntary  Psychosocial Assessment  Patient Complaints None  Eye Contact Fair  Facial Expression Animated  Affect Appropriate to circumstance  Speech Soft  Interaction Assertive  Motor Activity Slow  Appearance/Hygiene Unremarkable  Behavior Characteristics Cooperative  Mood Pleasant  Thought Process  Coherency Tangential  Content Preoccupation;Delusions  Delusions None reported or observed  Perception WDL  Hallucination None reported or observed  Judgment Impaired  Confusion Moderate  Danger to Self  Current suicidal ideation? Denies  Agreement Not to Harm Self Yes  Danger to Others  Danger to Others None reported or observed

## 2021-09-23 NOTE — Plan of Care (Signed)
  Problem: Education: Goal: Emotional status will improve Outcome: Progressing   Problem: Education: Goal: Mental status will improve Outcome: Progressing   Problem: Education: Goal: Verbalization of understanding the information provided will improve Outcome: Progressing   Problem: Coping: Goal: Ability to verbalize frustrations and anger appropriately will improve Outcome: Progressing  Patient compliant with medication. Isolative to her room and sleeping this shift. Support and encouragement provided.

## 2021-09-23 NOTE — [facility] Counselor (Signed)
Clinical Social Work Note  At treatment team request, CSW contacted patient's mother Truddie Hidden [phone] and informed her that the patient is not being discharged tomorrow.    CSW answered all questions about how patient is doing and when she might be discharged.  CSW emphasized once again that the patient really appears to qualify for and should apply for disability.  CSW asked mother to not become upset if she gets a message from the patient stating that she is due to discharge, but rather to call the hospital first and determine if that is based in reality or not.  Selmer Dominion, LCSW 09/23/2021, 2:59 PM

## 2021-09-24 DIAGNOSIS — F3112 Bipolar disorder, current episode manic without psychotic features, moderate: Secondary | ICD-10-CM | POA: Diagnosis not present

## 2021-09-24 MED ORDER — HALOPERIDOL 5 MG PO TABS
10.0000 mg | ORAL_TABLET | Freq: Two times a day (BID) | ORAL | Status: DC
  Administered 2021-09-24 – 2021-10-13 (×37): 10 mg via ORAL
  Filled 2021-09-24 (×46): qty 2

## 2021-09-24 MED ORDER — BENZTROPINE MESYLATE 0.5 MG PO TABS
0.5000 mg | ORAL_TABLET | Freq: Two times a day (BID) | ORAL | Status: DC
  Administered 2021-09-24 – 2021-10-02 (×16): 0.5 mg via ORAL
  Filled 2021-09-24 (×21): qty 1

## 2021-09-24 NOTE — Progress Notes (Signed)
[facility] MD Progress Note  09/24/2021 11:14 AM Natalie Brown  MRN:  [id number]  Reason for Admission:  Natalie Brown is a 56 y.o. female with a history of bipolar d/o, who was initially admitted for inpatient psychiatric hospitalization on 09/06/2021 for management of delusions and disorganized thinking. The patient is currently on Hospital Day 10.   Chart Review from last 24 hours: The patient's chart was reviewed and nursing notes were reviewed.The patient's case was discussed in multidisciplinary team meeting.  Per staff report patient slept a lot during the day yesterday.  Per MAR she was compliant with scheduled medications and did not require as needed medications yesterday. Per staff patient was noted to be sleepy but reported to be less disorganized. Trough Depakote level 6/3 low therapeutic at 64, LFTs within normal limits, CBC within normal limits except for slightly low hemoglobin 11.9  Information Obtained Today During Patient Interview: Patient was evaluated in her room this morning she is lying down in bed calm, does not appear anxious or restless able to answer questions in linear manner with no flight of ideas and racing thoughts noted today, describes her mood as "good" denies feeling hyper or depressed reports good sleep and appetite continues to deny SI HI or AVH and does not appear responding to stimuli or paranoia.  [name] able to maintain organized linear conversation but very minimally derails but easily redirectable.   She denies any incidents of diarrhea yesterday or this morning.  She does not display any sign of EPS and denies side effect to medications.  We will continue current medications the same except titrate Haldol from 5 mg 3 times daily to 10 mg twice daily for psychosis and mood stabilization  Principal Problem: Bipolar 1 disorder with moderate mania (HCC) Diagnosis: Active Problems:   Bipolar affective disorder, current episode manic with psychotic symptoms  (HCC)  Past Psychiatric History: see H&P  Past Medical History:  Past Medical History:  Diagnosis Date   Bipolar 1 disorder with moderate mania (HCC) 12/30/2013   Hypertension    Family History: see H&P  Family Psychiatric  History: see H&P  Social History:  Social History   Substance and Sexual Activity  Alcohol Use No     Social History   Substance and Sexual Activity  Drug Use No    Social History   Socioeconomic History   Marital status: Married    Spouse name: Not on file   Number of children: Not on file   Years of education: Not on file   Highest education level: Not on file  Occupational History   Not on file  Tobacco Use   Smoking status: Every Day   Smokeless tobacco: Current  Substance and Sexual Activity   Alcohol use: No   Drug use: No   Sexual activity: Not on file  Other Topics Concern   Not on file  Social History Narrative   Not on file   Social Determinants of Health   Financial Resource Strain: Not on file  Food Insecurity: Not on file  Transportation Needs: Not on file  Physical Activity: Not on file  Stress: Not on file  Social Connections: Not on file    Sleep: Good  Appetite:  Good  Current Medications: Current Facility-Administered Medications  Medication Dose Route Frequency Provider Last Rate Last Admin   alum & mag hydroxide-simeth (MAALOX/MYLANTA) 200-200-20 MG/5ML suspension 30 mL  30 mL Oral Q4H PRN Phineas Inches, MD  benztropine (COGENTIN) tablet 1 mg  1 mg Oral BID PRN Bartholomew CrewsSingleton, Amy E, MD   1 mg at 09/14/21 1254   benztropine mesylate (COGENTIN) injection 1 mg  1 mg Intramuscular BID PRN Comer LocketSingleton, Amy E, MD       divalproex (DEPAKOTE ER) 24 hr tablet 2,000 mg  2,000 mg Oral QHS Durenda Pechacek, MD   2,000 mg at 09/23/21 1938   docusate sodium (COLACE) capsule 100 mg  100 mg Oral Daily Mason JimSingleton, Amy E, MD   100 mg at 09/24/21 0805   feeding supplement (KATE FARMS STANDARD 1.4) liquid 325 mL  325 mL Oral BID  BM Massengill, Harrold DonathNathan, MD   325 mL at 09/24/21 0805   fluticasone (FLONASE) 50 MCG/ACT nasal spray 1 spray  1 spray Each Nare Daily Massengill, Nathan, MD   1 spray at 09/24/21 0804   haloperidol (HALDOL) tablet 5 mg  5 mg Oral TID Sarita BottomAttiah, Leroi Haque, MD   5 mg at 09/24/21 0805   hydrOXYzine (ATARAX) tablet 25 mg  25 mg Oral TID PRN Phineas InchesMassengill, Nathan, MD   25 mg at 09/10/21 1204   LORazepam (ATIVAN) tablet 1 mg  1 mg Oral Q6H PRN Comer LocketSingleton, Amy E, MD       magnesium hydroxide (MILK OF MAGNESIA) suspension 30 mL  30 mL Oral Daily PRN Massengill, Nathan, MD       nicotine polacrilex (NICORETTE) gum 2 mg  2 mg Oral PRN Abbott PaoAttiah, Marius Betts, MD   2 mg at 09/22/21 0821   OLANZapine zydis (ZYPREXA) disintegrating tablet 5 mg  5 mg Oral Q8H PRN Bartholomew CrewsSingleton, Amy E, MD   5 mg at 09/14/21 1255   And   ziprasidone (GEODON) injection 20 mg  20 mg Intramuscular PRN Comer LocketSingleton, Amy E, MD        Lab Results:  Results for orders placed or performed during the hospital encounter of 09/06/21 (from the past 48 hour(s))  Valproic acid level     Status: None   Collection Time: 09/22/21  6:24 PM  Result Value Ref Range   Valproic Acid Lvl 64 50.0 - 100.0 ug/mL    Comment: Performed at [facility], [address] [place], [place] [address]  Hepatic function panel     Status: None   Collection Time: 09/22/21  6:24 PM  Result Value Ref Range   Total Protein 6.9 6.5 - 8.1 g/dL   Albumin 3.5 3.5 - 5.0 g/dL   AST 22 15 - 41 U/L   ALT 18 0 - 44 U/L   Alkaline Phosphatase 59 38 - 126 U/L   Total Bilirubin 0.6 0.3 - 1.2 mg/dL   Bilirubin, Direct <6.0<0.1 0.0 - 0.2 mg/dL   Indirect Bilirubin NOT CALCULATED 0.3 - 0.9 mg/dL    Comment: Performed at [facility], [address] [place], [place] [address]  CBC with Differential/Platelet     Status: Abnormal   Collection Time: 09/22/21  6:24 PM  Result Value Ref Range   WBC 4.4 4.0 - 10.5 K/uL   RBC 3.92 3.87 - 5.11 MIL/uL   Hemoglobin 11.9 (L)  12.0 - 15.0 g/dL   HCT 81.136.8 91.436.0 - 78.246.0 %   MCV 93.9 80.0 - 100.0 fL   MCH 30.4 26.0 - 34.0 pg   MCHC 32.3 30.0 - 36.0 g/dL   RDW 95.613.8 21.311.5 - 08.615.5 %   Platelets 235 150 - 400 K/uL   nRBC 0.0 0.0 - 0.2 %   Neutrophils Relative %  55 %   Neutro Abs 2.5 1.7 - 7.7 K/uL   Lymphocytes Relative 28 %   Lymphs Abs 1.2 0.7 - 4.0 K/uL   Monocytes Relative 8 %   Monocytes Absolute 0.3 0.1 - 1.0 K/uL   Eosinophils Relative 7 %   Eosinophils Absolute 0.3 0.0 - 0.5 K/uL   Basophils Relative 1 %   Basophils Absolute 0.0 0.0 - 0.1 K/uL   Immature Granulocytes 1 %   Abs Immature Granulocytes 0.04 0.00 - 0.07 K/uL    Comment: Performed at [facility], [address] [place], [place] [address]      Blood Alcohol level:  Lab Results  Component Value Date   ETH <10 09/05/2021    Metabolic Disorder Labs: Lab Results  Component Value Date   HGBA1C 5.3 09/08/2021   MPG 105.41 09/08/2021   No results found for: PROLACTIN Lab Results  Component Value Date   CHOL 173 09/08/2021   TRIG 110 09/08/2021   HDL 50 09/08/2021   CHOLHDL 3.5 09/08/2021   VLDL 22 09/08/2021   LDLCALC 101 (H) 09/08/2021   LDLCALC 102 (H) 06/21/2020    Physical Findings:  Musculoskeletal: Strength & Muscle Tone: within normal limits Gait & Station: normal Patient leans: N/A  Psychiatric Specialty Exam:  Presentation  General Appearance: casually dressed, fair hygiene, wearing hair bonnet and multiple layers of clothing  Eye Contact:Fleeting, limited eye contact  Speech: Remains with increased pushed speech but redirectable when asked to stop and listen  Speech Volume: normal  Mood and Affect  Mood: Minimally elated, no mood lability noted today, pleasant in general  Affect: Congruent, improved Much less tangential, no racing thoughts or flight of ideas noted today Thought Process  Thought Processes: Less disorganized, linear today, minimally derailing but easily  redirected  Orientation: Oriented to self, month and city but not situation  Thought Content: Ongoing grandiosity, denies paranoia, denies SI HI or AVH  Hallucinations: Denied  Ideas of Reference:Denied  Suicidal Thoughts: denied  Homicidal Thoughts:denied  Sensorium  Memory:Limited secondary to mania and racing thoughts  Judgment:Improved yet limited, compliant with medication and treatment plan Insight: Poor  Executive Functions  Concentration: Improved, easily redirectable  Attention Span:Poor  Recall:Poor  Fund of Knowledge:Limited secondary to psychosis/mania  Language:Fair   Psychomotor Activity  Psychomotor Activity:no stiffness, no cogwheeling, no tremor, AIMS 0   Assets  Assets:Communication Skills; Physical Health   Sleep  7 hours   Physical Exam Vitals and nursing note reviewed.  Constitutional:      Appearance: Normal appearance.  HENT:     Head: Normocephalic.  Pulmonary:     Effort: Pulmonary effort is normal.  Skin:    Comments: No rash noted to face  Neurological:     General: No focal deficit present.     Mental Status: [name] alert.   Review of Systems  Constitutional:  Negative for fever.  Respiratory:  Negative for shortness of breath.   Cardiovascular:  Negative for chest pain.  Gastrointestinal:  Negative for diarrhea, nausea and vomiting.  Skin:        Perceives is acne on face  Neurological:  Negative for headaches.  Blood pressure 107/81, pulse (!) 102, temperature 98.6 F (37 C), temperature source Oral, resp. rate 16, height 5\' 6"  (1.676 m), weight 72.6 kg, SpO2 95 %. Body mass index is 25.83 kg/m.   Treatment Plan Summary: Diagnoses / Active Problems: Bipolar I MRE manic severe with psychotic features  PLAN: Safety and  Monitoring:  -- Converted to IVC admission  -- Daily contact with patient to assess and evaluate symptoms and progress in treatment  -- Patient's case to be discussed in multi-disciplinary team  meeting  -- Observation Level : q15 minute checks  -- Vital signs:  q12 hours  -- Precautions: suicide, elopement, and assault  -- Attempting off unit privileges and monitoring behavior  2. Psychiatric Diagnoses and Treatment:   Bipolar I MRE manic with psychotic features  -- Continue off Zyprexa, was discontinued secondary to lack of efficacy  -Titrate Haldol from 5 mg 3 times daily to 10 mg twice daily to help with psyhcosis and mood stabilization, monitor for SE  -Start Cogentin 0.5 twice daily for EPS prophylaxis and continue to monitor Lipid Panel: WNL except for LDL 101; HbgA1c: 5.3; QTc:466ms   -- Continue Depakote 2000 mg at bedtime  We will repeat Depakote level on 6/7 to ensure therapeutic level and no toxicity Will need more meaningful discussion of psychoeducation about med and need for birth control as she clears CBC WNL 5/28; AST 33 and ALT 37 on 5/28 and VPA level 20 on 5/28 -(recheck VPA level, CBC and LFTs in 3-4 days after dose increase) NH3 level 32 on 5/28  -- Ativan was discontinued few days ago to limit sedative agents.  -- Zyprexa/Geodon/ativan agitation protocol  -- Encouraged patient to participate in unit milieu and in scheduled group therapies     3. Medical Issues Being Addressed:   Mildly elevated creatinine   -- repeat CMP shows creatinine 1.08 and po hydration encouraged   Constipation  - continue colace  daily and encouraged PRN MOM    Season allergies  - continue Claritin  daily PRN  4. Discharge Planning:   -- Social work and case management to assist with discharge planning and identification of hospital follow-up needs prior to discharge  -- Discharge Concerns: Need to establish a safety plan; Medication compliance and effectiveness  -- Discharge Goals: Return home with outpatient referrals for mental health follow-up including medication management/psychotherapy Patient is homeless with limited insurance coverage, we will plan  discharging to homeless shelters with ACT team resources and follow-up, also recommending applying for disability and Medicaid to facilitate long-term placement which will be needed to avoid further decompensation and rehospitalization.  Total Time Spent in Direct Patient Care:  I personally spent 35 minutes on the unit in direct patient care. The direct patient care time included face-to-face time with the patient, reviewing the patient's chart, communicating with other professionals, and coordinating care. Greater than 50% of this time was spent in counseling or coordinating care with the patient regarding goals of hospitalization, psycho-education, and discharge planning needs.   Nadie Fiumara Abbott Pao, MD

## 2021-09-24 NOTE — Progress Notes (Signed)
   09/24/21 2215  Psych Admission Type (Psych Patients Only)  Admission Status Involuntary  Psychosocial Assessment  Patient Complaints Anxiety  Eye Contact Fair  Facial Expression Anxious;Animated  Affect Appropriate to circumstance  Speech Soft  Interaction Assertive  Motor Activity Slow  Appearance/Hygiene Disheveled  Behavior Characteristics Cooperative  Mood Preoccupied;Pleasant  Aggressive Behavior  Effect No apparent injury  Thought Process  Coherency Circumstantial;Disorganized;Tangential  Content Preoccupation  Delusions Grandeur  Perception WDL  Hallucination None reported or observed  Judgment Impaired  Confusion Moderate  Danger to Self  Current suicidal ideation? Denies  Danger to Others  Danger to Others None reported or observed

## 2021-09-24 NOTE — Progress Notes (Signed)
   09/24/21 0806  Psych Admission Type (Psych Patients Only)  Admission Status Involuntary  Psychosocial Assessment  Patient Complaints Anxiety  Eye Contact Fair  Facial Expression Animated  Affect Appropriate to circumstance  Speech Soft  Interaction Assertive  Motor Activity Slow  Appearance/Hygiene Unremarkable  Behavior Characteristics Cooperative  Mood Pleasant  Thought Process  Coherency Tangential  Content Preoccupation  Delusions None reported or observed  Perception WDL  Hallucination None reported or observed  Judgment Impaired  Confusion Moderate  Danger to Self  Current suicidal ideation? Denies  Agreement Not to Harm Self Yes  Description of Agreement Verbal  Danger to Others  Danger to Others None reported or observed

## 2021-09-24 NOTE — Group Note (Signed)
Recreation Therapy Group Note   Group Topic:Coping Skills  Group Date: 09/24/2021 Start Time: 1000 End Time: 1030 Facilitators: Caroll Rancher, LRT,CTRS Location: [address] Dayroom   Goal Area(s) Addresses:  Patient will identify positive coping skills techniques. Patient will identify benefits of using coping skills post d/c.   Group Description:  Mind Map.  Patient was provided a blank template of a diagram with 32 blank boxes in a tiered system, branching from the center (similar to a bubble chart). LRT directed patients to label the middle of the diagram "Coping Skills" and consider 8 different challenges that would require coping skills.  LRT and patients filled in the 2nd tier of boxes together (anxiety, depression, frustration, lack of patience, family, anger, relationships and finances).  Patients were to then come up with 3 positive coping skills that could address each challenge identified.  Patients would then share their finding with the group and LRT would write the responses on the board.  Patients were to fill in any blank spaces they may have had on their mind maps.   Affect/Mood: Appropriate   Participation Level: Active   Participation Quality: Moderate Cues   Behavior: Cooperative   Speech/Thought Process: Irrational   Insight: Poor   Judgement: Poor   Modes of Intervention: Worksheet   Patient Response to Interventions:  Attentive   Education Outcome:  Acknowledges education and In group clarification offered    Clinical Observations/Individualized Feedback: Pt had a hard time following instructions.  Pt kept trying to turn worksheet in even after LRT explained numerous times, they were to just say their answers to be written on the board.  Pt had a hard time identifying coping skill and the sheet itself.  Pt identified some coping skills as religion, be heard, acceptance, sister, mom, talk to coworkers, be valued, joy and spirituality.     Plan: Continue  to engage patient in RT group sessions 2-3x/week.   Caroll Rancher, LRT,CTRS 09/24/2021 1:09 PM

## 2021-09-24 NOTE — Progress Notes (Signed)
Adult Psychoeducational Group Note  Date:  09/24/2021 Time:  11:58 PM  Group Topic/Focus:  Wrap-Up Group:   The focus of this group is to help patients review their daily goal of treatment and discuss progress on daily workbooks.  Participation Level:  Active  Participation Quality:  Appropriate  Affect:  Appropriate  Cognitive:  Appropriate  Insight: Appropriate  Engagement in Group:  Limited  Modes of Intervention:  Discussion  Additional Comments: Pt stated her goal for today was to focus on her treatment plan. Pt stated she accomplished her goal today. Pt stated she talked with her doctor and with her social worker about her care today. Pt rated her overall day a 10. Pt stated she was able to contact her mother and her mother coming for visitation today improved her overall day. Pt stated she felt better about herself tonight. Pt stated staff brought back all meals today. Pt stated she took all medications provided today. Pt stated her appetite was pretty good today. Pt rated her sleep last night was pretty good. Pt stated the goal tonight was to get some rest. Pt stated she had no physical pain tonight. Pt deny visual hallucinations and auditory issues tonight. Pt denies thoughts of harming herself or others. Pt stated she would alert staff if anything changed.   Felipa Furnace 09/24/2021, 11:58 PM

## 2021-09-24 NOTE — [facility] Counselor (Signed)
CSW sent a court letter to Marla Roe regarding request to extend patient IVC for another week.    Peyton Spengler, LCSW, LCAS Clincal Social Worker  [facility]

## 2021-09-24 NOTE — Group Note (Signed)
[facility] LCSW Group Therapy Note   Group Date: 09/24/2021 Start Time: 1300 End Time: 1400   Type of Therapy/Topic:  Group Therapy:  Emotion Regulation  Participation Level:  Did Not Attend   Mood:  Description of Group:    The purpose of this group is to assist patients in learning to regulate negative emotions and experience positive emotions. Patients will be guided to discuss ways in which they have been vulnerable to their negative emotions. These vulnerabilities will be juxtaposed with experiences of positive emotions or situations, and patients challenged to use positive emotions to combat negative ones. Special emphasis will be placed on coping with negative emotions in conflict situations, and patients will process healthy conflict resolution skills.  Therapeutic Goals: Patient will identify two positive emotions or experiences to reflect on in order to balance out negative emotions:  Patient will label two or more emotions that they find the most difficult to experience:  Patient will be able to demonstrate positive conflict resolution skills through discussion or role plays:   Summary of Patient Progress:   Did not attend    Therapeutic Modalities:   Cognitive Behavioral Therapy Feelings Identification Dialectical Behavioral Therapy   Aries Townley E Conan Mcmanaway, LCSW 

## 2021-09-25 DIAGNOSIS — F3112 Bipolar disorder, current episode manic without psychotic features, moderate: Secondary | ICD-10-CM | POA: Diagnosis not present

## 2021-09-25 NOTE — Group Note (Signed)
Recreation Therapy Group Note   Group Topic:Other  Group Date: 09/25/2021 Start Time: 1000 End Time: 1045 Facilitators: Caroll Rancher, LRT,CTRS Location: [address] Dayroom   Goal Area(s) Addresses:  Patient will identify and write positive traits and accomplishments about themself. Patient will acknowledge the benefit of healthy self-expression. Patient will endorse understanding of self-expression.   Group Description:  LRT began group session with open dialogue asking the patients to define self-expression and verbally identify what the purpose of self-expression is. Patients were then instructed to design a personalized license plate, with words and drawings, representing positive things about themselves. Patients were encouraged to include favorites, things they are proud of, what they enjoy doing, and dreams for their future. If a patient had a life motto or a meaningful phase that expressed their life values, patients were asked to incorporate that into their design as well. Patients were given the opportunity to share their completed work with the group.    Affect/Mood: Appropriate   Participation Level: Engaged   Participation Quality: Independent   Behavior: Cooperative   Speech/Thought Process: Confused   Insight: Lacking   Judgement: Lacking    Modes of Intervention: Art and Music   Patient Response to Interventions:  Challenging    Education Outcome:  Acknowledges education and In group clarification offered    Clinical Observations/Individualized Feedback: Pt was unable to follow instructions.  Pt didn't comprehend what to do during group.  Pt was to use drawing and words to tell LRT about herself, instead pt tried to redraw the picture on the puzzle box.  When asked directly about what makes her stick out or different from the next person, pt stated "I don't know".  Pt then proceeded to tell LRT she didn't eat breakfast because she doesn't like eggs.  When asked  if she told anyone she didn't like eggs, pt stated yes and she wanted cereal.  Pt then went on a whole tangent about cereal and kinds of cereal she likes.  LRT finally got pt to express other things about herself.  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     Plan: Continue to engage patient in RT group sessions 2-3x/week.   Caroll Rancher, LRT,CTRS 09/25/2021 12:15 PM

## 2021-09-25 NOTE — [facility] Counselor (Signed)
CSW met with patient and assisted patient with participating in court for IVC hearing. Patient was in agreeance that she needs to stay for ongoing stabilization.    Ned Kakar, LCSW, [place] Social Worker  [facility]

## 2021-09-25 NOTE — Progress Notes (Signed)
   09/25/21 0545  Sleep  Number of Hours 8.5

## 2021-09-25 NOTE — Progress Notes (Signed)
   09/25/21 2215  Psych Admission Type (Psych Patients Only)  Admission Status Involuntary  Psychosocial Assessment  Patient Complaints Anxiety  Eye Contact Fair  Facial Expression Anxious;Animated  Affect Appropriate to circumstance  Speech Soft  Interaction Assertive  Motor Activity Slow  Appearance/Hygiene Disheveled  Behavior Characteristics Cooperative  Mood Preoccupied;Pleasant  Aggressive Behavior  Effect No apparent injury  Thought Process  Coherency Circumstantial;Disorganized;Tangential  Content Preoccupation  Delusions Grandeur  Perception WDL  Hallucination None reported or observed  Judgment Impaired  Confusion Moderate  Danger to Self  Current suicidal ideation? Denies  Danger to Others  Danger to Others None reported or observed

## 2021-09-25 NOTE — Progress Notes (Signed)
[facility] MD Progress Note  09/25/2021 2:44 PM CHALICE PHILBERT  MRN:  [id number]  Subjective: Natalie Brown states "I am okay. [address] called my mom's phone and didn't know I had the phone. They told me to come. They helped me for a few months with the rent for a hotel", in response to how she is feeling today.  Reason for Admission:  Natalie Brown is a 56 y.o. female with a history of bipolar d/o, who was initially admitted for inpatient psychiatric hospitalization on 09/06/2021 for management of delusions and disorganized thinking.   Today's patient assessment Note: Patient's chart is reviewed, her case discussed with her treatment team. Pt with a euphoric mood, & affect is congruent. Pt's attention to personal hygiene and grooming is fair, eye contact is good, speech is clear, but with flight of ideas, and lacking in coherency. [name] tangential in her responses to questions, & thought contents are disorganized, with some illogical contents. Pt talked at length [address] helping her get a hotel room when asked how she was feeling earlier in the morning. She rambled about being on her mother's phone, and how she went down town after the phone call, etc. She currently denies SI/HI/AVH or paranoia. There is no evidence of delusional thoughts.    Pt reports that her sleep quality last night was good, and she reports a fair appetite, and denies any medication related side effects to medications. Will continue medications as listed below since last increase in dose of her Haldol to 10 mg BID was on 6/5.  Labs reviewed, Valproic acid level on 6/3 was therapeutic at 64. Repeating a level tonight to ensure that level is remaining therapeutic.  Principal Problem: Bipolar 1 disorder with moderate mania (HCC) Diagnosis: Active Problems:   Bipolar affective disorder, current episode manic with psychotic symptoms (HCC)  Past Psychiatric History: see H&P  Past Medical History:  Past Medical History:  Diagnosis  Date   Bipolar 1 disorder with moderate mania (HCC) 12/30/2013   Hypertension    Family History: see H&P  Family Psychiatric  History: see H&P  Social History:  Social History   Substance and Sexual Activity  Alcohol Use No     Social History   Substance and Sexual Activity  Drug Use No    Social History   Socioeconomic History   Marital status: Married    Spouse name: Not on file   Number of children: Not on file   Years of education: Not on file   Highest education level: Not on file  Occupational History   Not on file  Tobacco Use   Smoking status: Every Day   Smokeless tobacco: Current  Substance and Sexual Activity   Alcohol use: No   Drug use: No   Sexual activity: Not on file  Other Topics Concern   Not on file  Social History Narrative   Not on file   Social Determinants of Health   Financial Resource Strain: Not on file  Food Insecurity: Not on file  Transportation Needs: Not on file  Physical Activity: Not on file  Stress: Not on file  Social Connections: Not on file    Sleep: Good  Appetite:  Good  Current Medications: Current Facility-Administered Medications  Medication Dose Route Frequency Provider Last Rate Last Admin   alum & mag hydroxide-simeth (MAALOX/MYLANTA) 200-200-20 MG/5ML suspension 30 mL  30 mL Oral Q4H PRN Massengill, Harrold Donath, MD       benztropine (COGENTIN) tablet 0.5  mg  0.5 mg Oral BID Abbott Pao, Nadir, MD   0.5 mg at 09/25/21 0844   benztropine (COGENTIN) tablet 1 mg  1 mg Oral BID PRN Comer Locket, MD   1 mg at 09/14/21 1254   benztropine mesylate (COGENTIN) injection 1 mg  1 mg Intramuscular BID PRN Comer Locket, MD       divalproex (DEPAKOTE ER) 24 hr tablet 2,000 mg  2,000 mg Oral QHS Attiah, Nadir, MD   2,000 mg at 09/24/21 2050   docusate sodium (COLACE) capsule 100 mg  100 mg Oral Daily Mason Jim, Amy E, MD   100 mg at 09/25/21 0844   feeding supplement (KATE FARMS STANDARD 1.4) liquid 325 mL  325 mL Oral BID BM  Massengill, Nathan, MD   325 mL at 09/24/21 1640   fluticasone (FLONASE) 50 MCG/ACT nasal spray 1 spray  1 spray Each Nare Daily Massengill, Nathan, MD   1 spray at 09/25/21 0844   haloperidol (HALDOL) tablet 10 mg  10 mg Oral BID Abbott Pao, Nadir, MD   10 mg at 09/25/21 0844   hydrOXYzine (ATARAX) tablet 25 mg  25 mg Oral TID PRN Phineas Inches, MD   25 mg at 09/10/21 1204   LORazepam (ATIVAN) tablet 1 mg  1 mg Oral Q6H PRN Mason Jim, Amy E, MD       magnesium hydroxide (MILK OF MAGNESIA) suspension 30 mL  30 mL Oral Daily PRN Massengill, Nathan, MD       nicotine polacrilex (NICORETTE) gum 2 mg  2 mg Oral PRN Abbott Pao, Nadir, MD   2 mg at 09/22/21 0821   OLANZapine zydis (ZYPREXA) disintegrating tablet 5 mg  5 mg Oral Q8H PRN Bartholomew Crews E, MD   5 mg at 09/14/21 1255   And   ziprasidone (GEODON) injection 20 mg  20 mg Intramuscular PRN Comer Locket, MD        Lab Results:  No results found for this or any previous visit (from the past 48 hour(s)).     Blood Alcohol level:  Lab Results  Component Value Date   ETH <10 09/05/2021    Metabolic Disorder Labs: Lab Results  Component Value Date   HGBA1C 5.3 09/08/2021   MPG 105.41 09/08/2021   No results found for: PROLACTIN Lab Results  Component Value Date   CHOL 173 09/08/2021   TRIG 110 09/08/2021   HDL 50 09/08/2021   CHOLHDL 3.5 09/08/2021   VLDL 22 09/08/2021   LDLCALC 101 (H) 09/08/2021   LDLCALC 102 (H) 06/21/2020    Physical Findings:  Musculoskeletal: Strength & Muscle Tone: within normal limits Gait & Station: normal Patient leans: N/A  Psychiatric Specialty Exam:  Presentation  General Appearance: casually dressed, fair hygiene, wearing hair bonnet and multiple layers of clothing  Eye Contact:Fleeting, limited eye contact  Speech: Remains with increased pushed speech but redirectable when asked to stop and listen  Speech Volume: normal  Mood and Affect  Mood: Minimally elated, no mood  lability noted today, pleasant in general  Affect: Congruent, improved Much less tangential, no racing thoughts or flight of ideas noted today Thought Process  Thought Processes: Less disorganized, linear today, minimally derailing but easily redirected  Orientation: Oriented to self, month and city but not situation  Thought Content: Ongoing grandiosity, denies paranoia, denies SI HI or AVH  Hallucinations: Denied  Ideas of Reference:Denied  Suicidal Thoughts: denied  Homicidal Thoughts:denied  Sensorium  Memory:Limited secondary to mania and racing thoughts  Judgment:Improved  yet limited, compliant with medication and treatment plan Insight: Poor  Executive Functions  Concentration: Improved, easily redirectable  Attention Span:Poor  Recall:Fair  Fund of Knowledge:Limited secondary to psychosis/mania  Language:Fair   Psychomotor Activity  Psychomotor Activity:no stiffness, no cogwheeling, no tremor, AIMS 0   Assets  Assets:Communication Skills  Sleep  8.5 hours  Physical Exam Vitals and nursing note reviewed.  Constitutional:      Appearance: Normal appearance.  HENT:     Head: Normocephalic.  Pulmonary:     Effort: Pulmonary effort is normal.  Skin:    Comments: No rash noted to face  Neurological:     General: No focal deficit present.     Mental Status: [name] alert.   Review of Systems  Constitutional:  Negative for fever.  Respiratory:  Negative for shortness of breath.   Cardiovascular:  Negative for chest pain.  Gastrointestinal:  Negative for diarrhea, nausea and vomiting.  Skin:        Perceives is acne on face  Neurological:  Negative for headaches.  Psychiatric/Behavioral:  Negative for depression and substance abuse.   Blood pressure 107/81, pulse (!) 102, temperature 98.6 F (37 C), temperature source Oral, resp. rate 16, height  (1.676 m), weight 72.6 kg, SpO2 95 %. Body mass index is 25.83 kg/m.   Treatment Plan  Summary: Diagnoses / Active Problems: Bipolar I MRE manic severe with psychotic features  PLAN: Safety and Monitoring:  -- Converted to IVC admission  -- Daily contact with patient to assess and evaluate symptoms and progress in treatment  -- Patient's case to be discussed in multi-disciplinary team meeting  -- Observation Level : q15 minute checks  -- Vital signs:  q12 hours  -- Precautions: suicide, elopement, and assault  -- Attempting off unit privileges and monitoring behavior  2. Psychiatric Diagnoses and Treatment:   Bipolar I MRE manic with psychotic features  -- Zyprexa was discontinued secondary to lack of efficacy  -Continue Haldol 10 mg BID for psychosis and mood stabilization, monitor for SE  -Continue Cogentin 0.5 twice daily for EPS prophylaxis and continue to monitor Lipid Panel: WNL except for LDL 101; HbgA1c: 5.3; QTc:445ms   -- Continue Depakote 2000 mg at bedtime. Repeating Depakote level on 6/7 to ensure therapeutic level and no toxicity Will need more meaningful discussion of psychoeducation about med and need for birth control as she clears CBC WNL 5/28; AST 33 and ALT 37 on 5/28 and VPA level 20 on 5/28 -(recheck VPA level, CBC and LFTs in 3-4 days after dose increase) NH3 level 32 on 5/28  -- Ativan was discontinued few days ago to limit sedative agents.  -- Zyprexa/Geodon/ativan agitation protocol  -- Encouraged patient to participate in unit milieu and in scheduled group therapies     3. Medical Issues Being Addressed:   Mildly elevated creatinine   -- repeat CMP shows creatinine 1.08 and po hydration encouraged   Constipation  - continue colace  daily and encouraged PRN MOM   Season allergies  - continue Claritin  daily PRN  4. Discharge Planning:   -- Social work and case management to assist with discharge planning and identification of hospital follow-up needs prior to discharge  -- Discharge Concerns: Need to establish a safety plan;  Medication compliance and effectiveness  -- Discharge Goals: Return home with outpatient referrals for mental health follow-up including medication management/psychotherapy Patient is homeless with limited insurance coverage, we will plan discharging to homeless shelters with ACT team  resources and follow-up. Referrals for ACT team have been placed by social work. Also recommending applying for disability and Medicaid to facilitate long-term placement which will be needed to avoid further decompensation and rehospitalization.  Starleen Blueoris  Shannel Zahm, NP Patient ID: Nobie Putnamngela M Lye, female   DOB: April 21, 1966, 56 y.o.   MRN: [id number]

## 2021-09-26 ENCOUNTER — Encounter (HOSPITAL_COMMUNITY): Payer: Self-pay

## 2021-09-26 DIAGNOSIS — F3112 Bipolar disorder, current episode manic without psychotic features, moderate: Secondary | ICD-10-CM

## 2021-09-26 LAB — VALPROIC ACID LEVEL: Valproic Acid Lvl: 75 ug/mL (ref 50.0–100.0)

## 2021-09-26 MED ORDER — PALIPERIDONE ER 3 MG PO TB24
3.0000 mg | ORAL_TABLET | Freq: Every day | ORAL | Status: DC
  Administered 2021-09-26 – 2021-10-01 (×6): 3 mg via ORAL
  Filled 2021-09-26 (×8): qty 1

## 2021-09-26 NOTE — Group Note (Signed)
[facility] LCSW Group Therapy   Type of Therapy and Topic:  Group Therapy:  Wellness   Participation Level: Active  Description of Group: This group allows individuals to explore the 6 dimensions of wellness, including spiritual, emotional, intellectual, physical, social, environmental, financial and spiritual. Patients will learn to different ways to practice wellness to improve well-being. Patients also participated in a conversation about what wellness means to them.   Individuals will think about ways in which they currently practice wellness as well as ways they can improve their wellness and new ways to practice wellness.       Therapeutic Goals Patient will verbalize 1 pr 2 we;;mess areas where they are doing well. Patient will identify 2 areas where they would like to improve their wellness.   Patient will provide a definition of what wellness means to them.  Patients will reflect on current hospitalization and primary areas to maintain mental health to prevent re-hospitalization.     Summary of Patient Progress:  Patient was disorganized when asked about 6 dimensions of wellness. Patient discussed that she is good at all 6 dimensions and started to discuss all her licensures and certifications for certain jobs and degrees.        Therapeutic Modalities Cognitive Behavioral Therapy Motivational Interviewing   Wallis Spizzirri, LCSW, LCAS [facility] Social Worker  [facility]

## 2021-09-26 NOTE — Progress Notes (Signed)
[facility] Natalie Brown Progress Note  09/26/2021 11:13 AM Natalie Brown  MRN:  [id number]  Subjective: Natalie Brown states "Brown'm doing well, Brown just do not know why we are eating in our rooms".  Reason for Admission:  Natalie Brown is a 56 y.o. female with a history of bipolar d/o, who was initially admitted for inpatient psychiatric hospitalization on 09/06/2021 for management of delusions and disorganized thinking.   Today's patient assessment Note: Natalie Brown is seen today in her room with Dr. Lucianne Muss present. Chart reviewed. The chart findings discussed with the treatment team. She presents alert & oriented to place, month, time & fairly to situation. [name] visible on the unit. [name] attending group sessions. Natalie Brown presents with a good affect, good eye contact & friendly. She speaks very clearly, however, her speech remains tangential as well as circumstantial. Her thought contents/process are disorganized & illogical. She says she doing well, slept well last night & has a good appetite. She says she knows where is currently "[facility]". 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 Then she states that she has a lot of families that "look like you", pointing to the attending psychiatrist. [name] went further to say that she has 18 sisters, but was able to relate to her one sister with a disabled son. Natalie Brown to be doing a little better from the time she was admitted to the [facility] which is going on 3 weeks as of date. Admission reports indicated that she was homeless, unemployed & has no mental health provider. It appeared she was not taking or has not been on her mental health medications in a long time. Although seems to be doing okay, patient's delusional thinking & other symptoms Brown to not have been responding adequately to her current treatment regimen. This warrants the need to add another antipsychotic therapy with an injectable format to help combat/stabilize her symptoms to  maintain mood stability. Natalie Brown at this time is started on Invega 3 mg tab today. She will be transitioned to the injectable format once she is able to tolerate the po form.  Natalie Brown currently denies SI/HI/AVH or paranoia. She denies any medication side effects. Will continue medications as listed below. Labs reviewed, stable. Vital signs remain stable as well. There are no behavioral issues reported by staff.  Principal Problem: Bipolar 1 disorder with moderate mania (HCC) Diagnosis: Active Problems:   Bipolar affective disorder, current episode manic with psychotic symptoms (HCC)  Past Psychiatric History: see H&P  Past Medical History:  Past Medical History:  Diagnosis Date   Bipolar 1 disorder with moderate mania (HCC) 12/30/2013   Hypertension    Family History: see H&P  Family Psychiatric  History: see H&P  Social History:  Social History   Substance and Sexual Activity  Alcohol Use No     Social History   Substance and Sexual Activity  Drug Use No    Social History   Socioeconomic History   Marital status: Married    Spouse name: Not on file   Number of children: Not on file   Years of education: Not on file   Highest education level: Not on file  Occupational History   Not on file  Tobacco Use   Smoking status: Every Day   Smokeless tobacco: Current  Substance and Sexual Activity   Alcohol use: No   Drug use: No   Sexual activity: Not on file  Other Topics  Concern   Not on file  Social History Narrative   Not on file   Social Determinants of Health   Financial Resource Strain: Not on file  Food Insecurity: Not on file  Transportation Needs: Not on file  Physical Activity: Not on file  Stress: Not on file  Social Connections: Not on file   Sleep: Good  Appetite:  Good  Current Medications: Current Facility-Administered Medications  Medication Dose Route Frequency Provider Last Rate Last Admin   alum & mag hydroxide-simeth (MAALOX/MYLANTA)  200-200-20 MG/5ML suspension 30 mL  30 mL Oral Q4H PRN Massengill, Nathan, Natalie Brown       benztropine (COGENTIN) tablet 0.5 mg  0.5 mg Oral BID Natalie Brown, Nadir, Natalie Brown   0.5 mg at 09/26/21 1013   benztropine (COGENTIN) tablet 1 mg  1 mg Oral BID PRN Natalie Brown, Natalie Brown, Natalie Brown   1 mg at 09/14/21 1254   benztropine mesylate (COGENTIN) injection 1 mg  1 mg Intramuscular BID PRN Natalie Brown, Natalie Brown, Natalie Brown       divalproex (DEPAKOTE ER) 24 hr tablet 2,000 mg  2,000 mg Oral QHS Natalie Brown, Nadir, Natalie Brown   2,000 mg at 09/25/21 2052   docusate sodium (COLACE) capsule 100 mg  100 mg Oral Daily Natalie Brown, Natalie Brown, Natalie Brown   100 mg at 09/26/21 1012   feeding supplement (KATE FARMS STANDARD 1.4) liquid 325 mL  325 mL Oral BID BM Massengill, Nathan, Natalie Brown   325 mL at 09/26/21 1014   fluticasone (FLONASE) 50 MCG/ACT nasal spray 1 spray  1 spray Each Nare Daily Massengill, Nathan, Natalie Brown   1 spray at 09/26/21 1013   haloperidol (HALDOL) tablet 10 mg  10 mg Oral BID Natalie Brown, Nadir, Natalie Brown   10 mg at 09/26/21 1013   hydrOXYzine (ATARAX) tablet 25 mg  25 mg Oral TID PRN Phineas InchesMassengill, Nathan, Natalie Brown   25 mg at 09/10/21 1204   LORazepam (ATIVAN) tablet 1 mg  1 mg Oral Q6H PRN Natalie Brown, Natalie Brown, Natalie Brown       magnesium hydroxide (MILK OF MAGNESIA) suspension 30 mL  30 mL Oral Daily PRN Massengill, Nathan, Natalie Brown       nicotine polacrilex (NICORETTE) gum 2 mg  2 mg Oral PRN Natalie Brown, Nadir, Natalie Brown   2 mg at 09/22/21 0821   OLANZapine zydis (ZYPREXA) disintegrating tablet 5 mg  5 mg Oral Q8H PRN Natalie Brown, Natalie Brown, Natalie Brown   5 mg at 09/14/21 1255   And   ziprasidone (GEODON) injection 20 mg  20 mg Intramuscular PRN Natalie Brown, Natalie Brown, Natalie Brown       paliperidone (INVEGA) 24 hr tablet 3 mg  3 mg Oral Daily Natalie Brown, Natalie Brown       Lab Results:  No results found for this or any previous visit (from the past 48 hour(s)).   Blood Alcohol level:  Lab Results  Component Value Date   ETH <10 09/05/2021   Metabolic Disorder Labs: Lab Results  Component Value Date   HGBA1C 5.3 09/08/2021   MPG 105.41  09/08/2021   No results found for: PROLACTIN Lab Results  Component Value Date   CHOL 173 09/08/2021   TRIG 110 09/08/2021   HDL 50 09/08/2021   CHOLHDL 3.5 09/08/2021   VLDL 22 09/08/2021   LDLCALC 101 (H) 09/08/2021   LDLCALC 102 (H) 06/21/2020   Physical Findings:  Musculoskeletal: Strength & Muscle Tone: within normal limits Gait & Station: normal Patient leans: N/A  Psychiatric Specialty Exam:  Presentation  General Appearance: casually dressed,  fair hygiene, wearing hair bonnet and multiple layers of clothing  Eye Contact:Fleeting, limited eye contact  Speech: Remains with increased pushed speech but redirectable when asked to stop and listen  Speech Volume: normal  Mood and Affect  Mood: Minimally elated, no mood lability noted today, pleasant in general  Affect: Congruent, improved Much less tangential, no racing thoughts or flight of ideas noted today Thought Process  Thought Processes: Less disorganized, linear today, minimally derailing but easily redirected  Orientation: Oriented to self, month and city but not situation  Thought Content: Ongoing grandiosity, denies paranoia, denies SI HI or AVH  Hallucinations: Denied  Ideas of Reference:Denied  Suicidal Thoughts: denied  Homicidal Thoughts:denied  Sensorium  Memory:Limited secondary to mania and racing thoughts  Judgment:Improved yet limited, compliant with medication and treatment plan Insight: Poor  Executive Functions  Concentration: Improved, easily redirectable  Attention Span:Poor  Recall:Fair  Fund of Knowledge:Limited secondary to psychosis/mania  Language:Fair  Psychomotor Activity  Psychomotor Activity:no stiffness, no cogwheeling, no tremor, AIMS 0   Assets  Assets:Communication Skills  Sleep  8.5 hours  Physical Exam Vitals and nursing note reviewed.  Constitutional:      Appearance: Normal appearance.  HENT:     Head: Normocephalic.  Pulmonary:     Effort:  Pulmonary effort is normal.  Skin:    Comments: No rash noted to face  Neurological:     General: No focal deficit present.     Mental Status: [name] alert.   Review of Systems  Constitutional:  Negative for fever.  Respiratory:  Negative for shortness of breath.   Cardiovascular:  Negative for chest pain.  Gastrointestinal:  Negative for diarrhea, nausea and vomiting.  Skin:        Perceives is acne on face  Neurological:  Negative for headaches.  Psychiatric/Behavioral:  Negative for depression and substance abuse.   Blood pressure 109/85, pulse (!) 103, temperature 99.1 F (37.3 C), temperature source Oral, resp. rate 16, height 5\' 6"  (1.676 m), weight 72.6 kg, SpO2 95 %. Body mass index is 25.83 kg/m.  Treatment Plan Summary: Diagnoses / Active Problems: Bipolar Brown MRE manic severe with psychotic features  PLAN: Safety and Monitoring:  -- Converted to IVC admission  -- Daily contact with patient to assess and evaluate symptoms and progress in treatment  -- Patient's case to be discussed in multi-disciplinary team meeting  -- Observation Level : q15 minute checks  -- Vital signs:  q12 hours  -- Precautions: suicide, elopement, and assault  -- Attempting off unit privileges and monitoring behavior  2. Psychiatric Diagnoses and Treatment:   Bipolar Brown MRE manic with psychotic features  -- Zyprexa was discontinued secondary to lack of efficacy  -Continue Haldol 10 mg BID for psychosis and mood stabilization, monitor  for SE.              _ initiated invega 3 mg po daily for mood control (transition to injectable in few days).  -Continue Cogentin 0.5 twice daily for EPS prophylaxis and continue to monitor Lipid Panel: WNL except for LDL 101; HbgA1c: 5.3; QTc:429ms   -- Continue Depakote 2000 mg at bedtime. Repeating Depakote level on 6/7 to ensure therapeutic level and no toxicity Will need more meaningful discussion of psychoeducation about med and need for birth control as  she clears CBC WNL 5/28; AST 33 and ALT 37 on 5/28 and VPA level 20 on 5/28 -(recheck VPA level, CBC and LFTs in 3-4 days after dose increase) NH3  level 32 on 5/28  -- Ativan was discontinued few days ago to limit sedative agents.  -- Zyprexa/Geodon/ativan agitation protocol  -- Encouraged patient to participate in unit milieu and in scheduled group therapies     3. Medical Issues Being Addressed:   Mildly elevated creatinine   -- repeat CMP shows creatinine 1.08 and po hydration encouraged   Constipation  - continue colace 100mg  daily and encouraged PRN MOM   Season allergies  - continue Claritin 10mg  daily PRN  4. Discharge Planning:   -- Social work and case management to assist with discharge planning and identification of hospital follow-up needs prior to discharge  -- Discharge Concerns: Need to establish a safety plan; Medication compliance and effectiveness  -- Discharge Goals: Return home with outpatient referrals for mental health follow-up including medication management/psychotherapy Patient is homeless with limited insurance coverage, we will plan discharging to homeless shelters with ACT team resources and follow-up. Referrals for ACT team have been placed by social work. Also recommending applying for disability and Medicaid to facilitate long-term placement which will be needed to avoid further decompensation and rehospitalization.  , Natalie Brown, pmhnp, fnp-bc. Patient ID: TORRA PALA, female   DOB: [birth year], 56 y.o.   MRN: [id number] Patient ID: EOLA WALDREP, female   DOB: 25-Aug-1965, 56 y.o.   MRN: [id number]

## 2021-09-26 NOTE — BHH Group Notes (Signed)
Adult Psychoeducational Group Note  Date:  09/26/2021 Time:  8:51 PM  Group Topic/Focus:  Goals Group:   The focus of this group is to help patients establish daily goals to achieve during treatment and discuss how the patient can incorporate goal setting into their daily lives to aide in recovery.  Participation Level:  Active  Participation Quality:  Attentive and Sharing  Affect:  Appropriate  Cognitive:  Disorganized  Insight: Appropriate  Engagement in Group:  Off Topic  Modes of Intervention:  Discussion  Additional Comments  Jacalyn Lefevre 09/26/2021, 8:51 PM

## 2021-09-26 NOTE — Group Note (Signed)
Recreation Therapy Group Note   Group Topic:Anger Management  Group Date: 09/26/2021 Start Time: 1005 End Time: 1020 Facilitators: Caroll Rancher, LRT,CTRS Location:  [address]   Goal Area(s) Addresses:  Patient will identify things that cause anger.  Patient will identify coping skills for anger. Patients will identify how anger coping skills can be used post d/c.  Group Description:  Anger Thermometer.  LRT individually discussed anger with patients.  Patients were to then identify 10 things that get them angry.  Patients were to then rank each instance on a thermometer from 10-1, 10 being the highest and one being the lowest.  Patients would then identify at least 5 coping skills they can use to deal with anger.   Affect/Mood: Flat   Participation Level: Moderate   Participation Quality: Maximum Cues   Behavior: Reluctant   Speech/Thought Process: Confused   Insight: Impaired   Judgement: Impaired   Modes of Intervention: Worksheet   Patient Response to Interventions:  Challenging    Education Outcome:  Acknowledges education   Clinical Observations/Individualized Feedback: Pt was given anger thermometer worksheet to complete.  Pt didn't understand the purpose of the activity.  Pt kept asking why she had to do the sheet.  LRT kept explaining the sheet to patient.  LRT helped patient fill out worksheet.  Pt ranked her boyfriend at number 3, nephew at number 7, mom at number 8, sister at number 19 and cigarettes at 21.  Pt explained she ranked sister at 39 because "she should have been gotten in home help for my nephew because of his cerebral palsy".  Pt had a hard time identifying coping skills as well.  Pt expressed some of her coping skills as [employer], walking, games on phone, spirituality, to belong and self worth.  Pt showed a lot of confusion during assignment.    Plan: Continue to engage patient in RT group sessions 2-3x/week.   Caroll Rancher, LRT,CTRS 09/26/2021  11:25 AM

## 2021-09-26 NOTE — Progress Notes (Signed)
Pt continues to be paranoid on the unit    09/26/21 2045  Psych Admission Type (Psych Patients Only)  Admission Status Involuntary  Psychosocial Assessment  Patient Complaints Anxiety  Eye Contact Fair  Facial Expression Anxious;Animated  Affect Appropriate to circumstance  Speech Soft  Interaction Assertive  Motor Activity Slow  Appearance/Hygiene Disheveled  Behavior Characteristics Cooperative  Mood Preoccupied  Aggressive Behavior  Effect No apparent injury  Thought Process  Coherency Circumstantial;Disorganized;Tangential  Content Preoccupation  Delusions Grandeur  Perception WDL  Hallucination None reported or observed  Judgment Impaired  Confusion Moderate  Danger to Self  Current suicidal ideation? Denies  Danger to Others  Danger to Others None reported or observed

## 2021-09-26 NOTE — Progress Notes (Signed)
   09/26/21 0515  Sleep  Number of Hours 7

## 2021-09-26 NOTE — BH IP Treatment Plan (Signed)
Interdisciplinary Treatment and Diagnostic Plan Update  09/26/2021 Time of Session: 9:55am  Natalie Brown MRN: [id number]  Principal Diagnosis: Bipolar 1 disorder with moderate mania ([place])  Secondary Diagnoses: Active Problems:   Bipolar affective disorder, current episode manic with psychotic symptoms (HCC)   Current Medications:  Current Facility-Administered Medications  Medication Dose Route Frequency Provider Last Rate Last Admin   alum & mag hydroxide-simeth (MAALOX/MYLANTA) 200-200-20 MG/5ML suspension 30 mL  30 mL Oral Q4H PRN Massengill, Nathan, MD       benztropine (COGENTIN) tablet 0.5 mg  0.5 mg Oral BID Winfred Leeds, Nadir, MD   0.5 mg at 09/25/21 2052   benztropine (COGENTIN) tablet 1 mg  1 mg Oral BID PRN Harlow Asa, MD   1 mg at 09/14/21 1254   benztropine mesylate (COGENTIN) injection 1 mg  1 mg Intramuscular BID PRN Harlow Asa, MD       divalproex (DEPAKOTE ER) 24 hr tablet 2,000 mg  2,000 mg Oral QHS Attiah, Nadir, MD   2,000 mg at 09/25/21 2052   docusate sodium (COLACE) capsule 100 mg  100 mg Oral Daily Nelda Marseille, Amy E, MD   100 mg at 09/25/21 0844   feeding supplement (KATE FARMS STANDARD 1.4) liquid 325 mL  325 mL Oral BID BM Massengill, Nathan, MD   325 mL at 09/25/21 1438   fluticasone (FLONASE) 50 MCG/ACT nasal spray 1 spray  1 spray Each Nare Daily Massengill, Nathan, MD   1 spray at 09/25/21 0844   haloperidol (HALDOL) tablet 10 mg  10 mg Oral BID Winfred Leeds, Nadir, MD   10 mg at 09/25/21 2052   hydrOXYzine (ATARAX) tablet 25 mg  25 mg Oral TID PRN Janine Limbo, MD   25 mg at 09/10/21 1204   LORazepam (ATIVAN) tablet 1 mg  1 mg Oral Q6H PRN Nelda Marseille, Amy E, MD       magnesium hydroxide (MILK OF MAGNESIA) suspension 30 mL  30 mL Oral Daily PRN Massengill, Nathan, MD       nicotine polacrilex (NICORETTE) gum 2 mg  2 mg Oral PRN Winfred Leeds, Nadir, MD   2 mg at 09/22/21 0821   OLANZapine zydis (ZYPREXA) disintegrating tablet 5 mg  5 mg Oral Q8H PRN  Viann Fish E, MD   5 mg at 09/14/21 1255   And   ziprasidone (GEODON) injection 20 mg  20 mg Intramuscular PRN Harlow Asa, MD       PTA Medications: Medications Prior to Admission  Medication Sig Dispense Refill Last Dose   CLARITIN 10 MG tablet Take 10 mg by mouth daily.      fluticasone (FLONASE) 50 MCG/ACT nasal spray Place 1 spray into both nostrils daily.      pseudoephedrine-guaifenesin (MUCINEX D) 60-600 MG 12 hr tablet Take 1 tablet by mouth every 12 (twelve) hours. 30 tablet 0     Patient Stressors: Health problems   Medication change or noncompliance    Patient Strengths: Capable of independent living  Hydrographic surveyor for treatment/growth  Supportive family/friends   Treatment Modalities: Medication Management, Group therapy, Case management,  1 to 1 session with clinician, Psychoeducation, Recreational therapy.   Physician Treatment Plan for Primary Diagnosis: Bipolar 1 disorder with moderate mania ([place]) Long Term Goal(s): Improvement in symptoms so as ready for discharge   Short Term Goals: Ability to identify and develop effective coping behaviors will improve Ability to maintain clinical measurements within normal limits will improve Compliance with prescribed medications will improve  Ability to identify changes in lifestyle to reduce recurrence of condition will improve Ability to verbalize feelings will improve Ability to disclose and discuss suicidal ideas Ability to demonstrate self-control will improve  Medication Management: Evaluate patient's response, side effects, and tolerance of medication regimen.  Therapeutic Interventions: 1 to 1 sessions, Unit Group sessions and Medication administration.  Evaluation of Outcomes: Progressing  Physician Treatment Plan for Secondary Diagnosis: Active Problems:   Bipolar affective disorder, current episode manic with psychotic symptoms ([place])  Long Term Goal(s): Improvement in symptoms so  as ready for discharge   Short Term Goals: Ability to identify and develop effective coping behaviors will improve Ability to maintain clinical measurements within normal limits will improve Compliance with prescribed medications will improve Ability to identify changes in lifestyle to reduce recurrence of condition will improve Ability to verbalize feelings will improve Ability to disclose and discuss suicidal ideas Ability to demonstrate self-control will improve     Medication Management: Evaluate patient's response, side effects, and tolerance of medication regimen.  Therapeutic Interventions: 1 to 1 sessions, Unit Group sessions and Medication administration.  Evaluation of Outcomes: Progressing   RN Treatment Plan for Primary Diagnosis: Bipolar 1 disorder with moderate mania ([place]) Long Term Goal(s): Knowledge of disease and therapeutic regimen to maintain health will improve  Short Term Goals: Ability to remain free from injury will improve, Ability to participate in decision making will improve, Ability to verbalize feelings will improve, Ability to disclose and discuss suicidal ideas, and Ability to identify and develop effective coping behaviors will improve  Medication Management: RN will administer medications as ordered by provider, will assess and evaluate patient's response and provide education to patient for prescribed medication. RN will report any adverse and/or side effects to prescribing provider.  Therapeutic Interventions: 1 on 1 counseling sessions, Psychoeducation, Medication administration, Evaluate responses to treatment, Monitor vital signs and CBGs as ordered, Perform/monitor CIWA, COWS, AIMS and Fall Risk screenings as ordered, Perform wound care treatments as ordered.  Evaluation of Outcomes: Progressing   LCSW Treatment Plan for Primary Diagnosis: Bipolar 1 disorder with moderate mania ([place]) Long Term Goal(s): Safe transition to appropriate next level of  care at discharge, Engage patient in therapeutic group addressing interpersonal concerns.  Short Term Goals: Engage patient in aftercare planning with referrals and resources, Increase social support, Increase emotional regulation, Facilitate acceptance of mental health diagnosis and concerns, Identify triggers associated with mental health/substance abuse issues, and Increase skills for wellness and recovery  Therapeutic Interventions: Assess for all discharge needs, 1 to 1 time with Social worker, Explore available resources and support systems, Assess for adequacy in community support network, Educate family and significant other(s) on suicide prevention, Complete Psychosocial Assessment, Interpersonal group therapy.  Evaluation of Outcomes: Progressing   Progress in Treatment: Attending groups: Yes. Participating in groups: Yes. Albeit, patient continues to be disruptive and tangential in group.  Taking medication as prescribed: Yes. Toleration medication: No. Family/Significant other contact made: Yes, individual(s) contacted:  SPE completed with Mother, Truddie Hidden [phone]) and Ruthe Lustig, sister, [phone] Patient understands diagnosis: No. Discussing patient identified problems/goals with staff: Yes. Medical problems stabilized or resolved: Yes. Denies suicidal/homicidal ideation: Yes. Issues/concerns per patient self-inventory: Yes. Other: none   New problem(s) identified: No, Describe:  none    New Short Term/Long Term Goal(s): Patient to work towards elimination of symptoms of psychosis, medication management for mood stabilization development of comprehensive mental wellness plan.   Patient Goals No additional goals identified at this time. Patient  to continue to work towards original goals identified in initial treatment team meeting. CSW will remain available to patient should they voice additional treatment goals.    Discharge Plan or Barriers:  Patient s  currently homeless, thoguh unable to engage in productive conversation, plan to continue treatment and discuss housing resources/options once patient's condition has improved.    Reason for Continuation of Hospitalization: Delusions , disorganized thought/behaviors         Estimated Length of Stay: 3 to 7 days     Last [place] Suicide Severity Risk Score: [place] Admission (Current) from 09/06/2021 in [place] [facility] ED from 09/05/2021 in [place] ED from 05/26/2020 in [place] High Risk High Risk No Risk       Last PHQ 2/9 Scores:    05/26/2020    2:14 PM 09/03/2017    4:34 PM 08/07/2016    2:12 PM  Depression screen PHQ 2/9  Decreased Interest 3 3 3   Down, Depressed, Hopeless 3 1 2   PHQ - 2 Score 6 4 5   Altered sleeping 0 3 3  Tired, decreased energy 0 3 0  Change in appetite 3 3 0  Feeling bad or failure about yourself  0 3 1  Trouble concentrating 3 0 0  Moving slowly or fidgety/restless 3 0 1  Suicidal thoughts 0 0 0  PHQ-9 Score 15 16 10     Scribe for Treatment Team: Darleen Crocker, Latanya Presser 09/26/2021 10:10 AM

## 2021-09-27 DIAGNOSIS — F3112 Bipolar disorder, current episode manic without psychotic features, moderate: Secondary | ICD-10-CM | POA: Diagnosis not present

## 2021-09-27 NOTE — Progress Notes (Signed)
Pt continues to be confused and disorganized at times, but is pleasant

## 2021-09-27 NOTE — Progress Notes (Signed)
Pt had hard time swallowing 4 big Depakote pills, pt needs to be changed to the Liquid or sprinkles, pt took over 10 minutes to swallow all 4 pills. Pt stated she has always had issue with larger pills.

## 2021-09-27 NOTE — Progress Notes (Signed)
   09/27/21 0515  Sleep  Number of Hours 5.75

## 2021-09-27 NOTE — Progress Notes (Signed)
[facility] MD Progress Note  09/27/2021 11:52 AM DANIAH ZALDIVAR  MRN:  [id number]  Subjective: Kang states "I'm doing a little normal today, that means, I feel good".  Reason for Admission:  ELIABETH SHOFF is a 56 y.o. female with a history of bipolar d/o, who was initially admitted for inpatient psychiatric hospitalization on 09/06/2021 for management of delusions and disorganized thinking.   Today's patient assessment Note: Lawson is seen today in her room. Chart reviewed. The chart findings discussed with the treatment team. She presents alert & oriented to place, month, time & fairly to situation. [name] visible on the unit. [name] attending group sessions. Chenee presents with a good affect, good eye contact & friendly. She speaks very clearly, however, her speech remains tangential as well as circumstantial. Her thought contents/process are disorganized & illogical.  Yamil seem to be doing a little better than from the time she was admitted to the [facility] which is going on 3 weeks as of date. Admission reports indicated that she was homeless, unemployed & has no mental health provider. It appeared she was not taking or has not been on her mental health medications in a long time. Although seems to be doing okay, patient's delusional thinking & other symptoms seem to not have been responding adequately to her current treatment regimen. This warrants the need to add another antipsychotic therapy with an injectable format to help combat/stabilize her symptoms to maintain mood stability. Maribeth at this time is started on Invega 3 mg tab yesterday. She will be transitioned to the injectable format once she is able to tolerate the po form.  Yamilee currently denies SI/HI/AVH or paranoia. She denies any medication side effects. Will continue medications as listed below. Labs reviewed, stable. Vital signs remain stable as well. There are no behavioral issues reported by staff. Will obtain a repeat CMP in  am.  Principal Problem: Bipolar 1 disorder with moderate mania (HCC)  Diagnosis: Active Problems:   Bipolar affective disorder, current episode manic with psychotic symptoms (HCC)  Past Psychiatric History: see H&P  Past Medical History:  Past Medical History:  Diagnosis Date   Bipolar 1 disorder with moderate mania (HCC) 12/30/2013   Hypertension    Family History: see H&P  Family Psychiatric  History: see H&P  Social History:  Social History   Substance and Sexual Activity  Alcohol Use No     Social History   Substance and Sexual Activity  Drug Use No    Social History   Socioeconomic History   Marital status: Married    Spouse name: Not on file   Number of children: Not on file   Years of education: Not on file   Highest education level: Not on file  Occupational History   Not on file  Tobacco Use   Smoking status: Every Day   Smokeless tobacco: Current  Substance and Sexual Activity   Alcohol use: No   Drug use: No   Sexual activity: Not on file  Other Topics Concern   Not on file  Social History Narrative   Not on file   Social Determinants of Health   Financial Resource Strain: Not on file  Food Insecurity: Not on file  Transportation Needs: Not on file  Physical Activity: Not on file  Stress: Not on file  Social Connections: Not on file   Sleep: Good  Appetite:  Good  Current Medications: Current Facility-Administered Medications  Medication Dose Route Frequency Provider Last Rate Last  Admin   alum & mag hydroxide-simeth (MAALOX/MYLANTA) 200-200-20 MG/5ML suspension 30 mL  30 mL Oral Q4H PRN Massengill, Nathan, MD       benztropine (COGENTIN) tablet 0.5 mg  0.5 mg Oral BID Abbott Pao, Nadir, MD   0.5 mg at 09/27/21 0948   benztropine (COGENTIN) tablet 1 mg  1 mg Oral BID PRN Comer Locket, MD   1 mg at 09/14/21 1254   benztropine mesylate (COGENTIN) injection 1 mg  1 mg Intramuscular BID PRN Comer Locket, MD       divalproex (DEPAKOTE  ER) 24 hr tablet 2,000 mg  2,000 mg Oral QHS Attiah, Nadir, MD   2,000 mg at 09/26/21 2024   docusate sodium (COLACE) capsule 100 mg  100 mg Oral Daily Mason Jim, Amy E, MD   100 mg at 09/27/21 0948   feeding supplement (KATE FARMS STANDARD 1.4) liquid 325 mL  325 mL Oral BID BM Massengill, Harrold Donath, MD   325 mL at 09/26/21 1355   fluticasone (FLONASE) 50 MCG/ACT nasal spray 1 spray  1 spray Each Nare Daily Massengill, Nathan, MD   1 spray at 09/27/21 0948   haloperidol (HALDOL) tablet 10 mg  10 mg Oral BID Abbott Pao, Nadir, MD   10 mg at 09/27/21 0948   hydrOXYzine (ATARAX) tablet 25 mg  25 mg Oral TID PRN Phineas Inches, MD   25 mg at 09/10/21 1204   LORazepam (ATIVAN) tablet 1 mg  1 mg Oral Q6H PRN Mason Jim, Amy E, MD       magnesium hydroxide (MILK OF MAGNESIA) suspension 30 mL  30 mL Oral Daily PRN Massengill, Nathan, MD       nicotine polacrilex (NICORETTE) gum 2 mg  2 mg Oral PRN Abbott Pao, Nadir, MD   2 mg at 09/22/21 0821   OLANZapine zydis (ZYPREXA) disintegrating tablet 5 mg  5 mg Oral Q8H PRN Bartholomew Crews E, MD   5 mg at 09/14/21 1255   And   ziprasidone (GEODON) injection 20 mg  20 mg Intramuscular PRN Comer Locket, MD       paliperidone (INVEGA) 24 hr tablet 3 mg  3 mg Oral Daily Armandina Stammer I, NP   3 mg at 09/27/21 1610   Lab Results:  Results for orders placed or performed during the hospital encounter of 09/06/21 (from the past 48 hour(s))  Valproic acid level     Status: None   Collection Time: 09/26/21  6:21 PM  Result Value Ref Range   Valproic Acid Lvl 75 50.0 - 100.0 ug/mL    Comment: Performed at [facility], [address], [place], [place] [address]   Blood Alcohol level:  Lab Results  Component Value Date   ETH <10 09/05/2021   Metabolic Disorder Labs: Lab Results  Component Value Date   HGBA1C 5.3 09/08/2021   MPG 105.41 09/08/2021   No results found for: "PROLACTIN" Lab Results  Component Value Date   CHOL 173 09/08/2021   TRIG  110 09/08/2021   HDL 50 09/08/2021   CHOLHDL 3.5 09/08/2021   VLDL 22 09/08/2021   LDLCALC 101 (H) 09/08/2021   LDLCALC 102 (H) 06/21/2020   Physical Findings:  Musculoskeletal: Strength & Muscle Tone: within normal limits Gait & Station: normal Patient leans: N/A  Psychiatric Specialty Exam: Psychiatric Specialty Exam: Physical Exam Vitals and nursing note reviewed.  Constitutional:      Appearance: Normal appearance.  HENT:     Head: Normocephalic.  Nose: Nose normal.     Mouth/Throat:     Pharynx: Oropharynx is clear.  Cardiovascular:     Rate and Rhythm: Normal rate.     Pulses: Normal pulses.  Pulmonary:     Effort: Pulmonary effort is normal.  Genitourinary:    Comments: Deferred Musculoskeletal:        General: Normal range of motion.     Cervical back: Normal range of motion.  Skin:    General: Skin is warm.     Comments: No rash noted to face  Neurological:     General: No focal deficit present.     Mental Status: [name] alert.     Review of Systems  Constitutional:  Negative for fever.  Respiratory:  Negative for shortness of breath.   Cardiovascular:  Negative for chest pain.  Gastrointestinal:  Negative for diarrhea, nausea and vomiting.  Skin:        Perceives is acne on face  Neurological:  Negative for headaches.  Psychiatric/Behavioral:  Negative for depression and substance abuse.     Blood pressure 114/73, pulse (!) 108, temperature 98.4 F (36.9 C), temperature source Oral, resp. rate 16, height 5\' 6"  (1.676 m), weight 72.6 kg, SpO2 98 %.Body mass index is 25.83 kg/m.  General Appearance: Casual and Fairly Groomed  Eye Contact:  Good  Speech:  Clear and Coherent, disorganized.  Volume:   Moderately increased.  Mood:  Euphoric  Affect:  Appropriate and Congruent  Thought Process:  Disorganized and Descriptions of Associations: Tangential  Orientation:  Full (Time, Place, and Person)  Thought Content:  Illogical, Rumination, and  Tangential  Suicidal Thoughts:  No  Homicidal Thoughts:  No  Memory:  Immediate;   Fair Recent;   Fair Remote;   Poor  Judgement:  Impaired  Insight:  Fair  Psychomotor Activity:  Normal  Concentration:  Concentration: Fair and Attention Span: Fair  Recall:  [employer]  Fund of Knowledge:  Poor  Language:  Good  Akathisia:  Negative  Handed:  Right  AIMS (if indicated):     Assets:  Communication Skills Desire for Improvement Physical Health  ADL's:  Intact  Cognition:  Impaired,  Mild  Sleep:  Number of Hours: 5.75    Physical Exam Vitals and nursing note reviewed.  Constitutional:      Appearance: Normal appearance.  HENT:     Head: Normocephalic.     Nose: Nose normal.     Mouth/Throat:     Pharynx: Oropharynx is clear.  Cardiovascular:     Rate and Rhythm: Normal rate.     Pulses: Normal pulses.  Pulmonary:     Effort: Pulmonary effort is normal.  Genitourinary:    Comments: Deferred Musculoskeletal:        General: Normal range of motion.     Cervical back: Normal range of motion.  Skin:    General: Skin is warm.     Comments: No rash noted to face  Neurological:     General: No focal deficit present.     Mental Status: [name] alert.    Review of Systems  Constitutional:  Negative for fever.  Respiratory:  Negative for shortness of breath.   Cardiovascular:  Negative for chest pain.  Gastrointestinal:  Negative for diarrhea, nausea and vomiting.  Skin:        Perceives is acne on face  Neurological:  Negative for headaches.  Psychiatric/Behavioral:  Negative for depression and substance abuse.    Blood pressure  114/73, pulse (!) 108, temperature 98.4 F (36.9 C), temperature source Oral, resp. rate 16, height 5\' 6"  (1.676 m), weight 72.6 kg, SpO2 98 %. Body mass index is 25.83 kg/m.  Treatment Plan Summary: Diagnoses / Active Problems: Bipolar I MRE manic severe with psychotic features  PLAN: Safety and Monitoring:  -- Converted to IVC  admission  -- Daily contact with patient to assess and evaluate symptoms and progress in treatment  -- Patient's case to be discussed in multi-disciplinary team meeting  -- Observation Level : q15 minute checks  -- Vital signs:  q12 hours  -- Precautions: suicide, elopement, and assault  -- Attempting off unit privileges and monitoring behavior  2. Psychiatric Diagnoses and Treatment:   Bipolar I MRE manic with psychotic features  -- Zyprexa was discontinued secondary to lack of efficacy  -Continue Haldol 10 mg BID for psychosis and mood stabilization, monitor  for SE.              - Continue invega 3 mg po daily for mood control (transition to injectable in few days).  -Continue Cogentin 0.5 twice daily for EPS prophylaxis and continue to monitor Lipid Panel: WNL except for LDL 101; HbgA1c: 5.3; QTc:440ms   -- Continue Depakote 2000 mg at bedtime. Repeating Depakote level on 6/7 to ensure therapeutic level and no toxicity Will need more meaningful discussion of psychoeducation about med and need for birth control as she clears CBC WNL 5/28; AST 33 and ALT 37 on 5/28 and VPA level 20 on 5/28 -(recheck VPA level, CBC and LFTs in 3-4 days after dose increase) NH3 level 32 on 5/28  -- Ativan was discontinued few days ago to limit sedative agents.  -- Zyprexa/Geodon/ativan agitation protocol  -- Encouraged patient to participate in unit milieu and in scheduled group therapies     3. Medical Issues Being Addressed:   Mildly elevated creatinine   -- repeat CMP shows creatinine 1.08 and po hydration encouraged   Constipation  - continue colace 100mg  daily and encouraged PRN MOM   Season allergies  - continue Claritin 10mg  daily PRN  4. Discharge Planning:   -- Social work and case management to assist with discharge planning and identification of hospital follow-up needs prior to discharge  -- Discharge Concerns: Need to establish a safety plan; Medication compliance and  effectiveness  -- Discharge Goals: Return home with outpatient referrals for mental health follow-up including medication management/psychotherapy Patient is homeless with limited insurance coverage, we will plan discharging to homeless shelters with ACT team resources and follow-up. Referrals for ACT team have been placed by social work. Also recommending applying for disability and Medicaid to facilitate long-term placement which will be needed to avoid further decompensation and rehospitalization.  6/28, NP, pmhnp, fnp-bc. Patient ID: GELILA WELL, female   DOB: Jan 21, 1966, 56 y.o.   MRN: Nobie Putnam Patient ID: AADYA KINDLER, female   DOB: 1965-06-10, 56 y.o.   MRN: Nobie Putnam Patient ID: MELORA MENON, female   DOB: Nov 23, 1965, 56 y.o.   MRN: Nobie Putnam

## 2021-09-27 NOTE — Group Note (Signed)
Recreation Therapy Group Note   Group Topic:Goal Setting  Group Date: 09/27/2021 Start Time: 1000 End Time: 1020 Facilitators: Caroll Rancher, LRT,CTRS Location: [address] Dayroom   Goal Area(s) Addresses:  Patient will participate in discussion of what a goal is. Patient will successfully identify goals they want to complete. Patient will identify how goals can be beneficial post d/c.  Group Description:  LRT and patients had a discussion on what goals are and how they are beneficial.  Patients were then a worksheet where they were to identify goals they hope to accomplish in a week, month, year and five years.  Patients would identify obstacles to those goals, what they need to reach those goals and what they can start doing right now to work towards goals.   Affect/Mood: Appropriate   Participation Level: Moderate   Participation Quality: Independent   Behavior: Appropriate   Speech/Thought Process: Confusion   Insight: Fair   Judgement: Fair    Modes of Intervention: Worksheet   Patient Response to Interventions:  Engaged   Education Outcome:  Acknowledges education and In group clarification offered    Clinical Observations/Individualized Feedback: Pt was engaged and pleasant.  Pt showed some confusion in group.  Pt took a while to start the activity seeming as if she didn't know what to do.  Pt expressed in a week, she wants to be nice; in a month save for rent, in a year get another part time job and in 5 years keep saving.  Pt identified obstacles as "my doctor and social worker".  Pt went on to say she needs to talk differently to complete goal.  Pt expressed not know what she could start doing right now to work towards goal.     Plan: Continue to engage patient in RT group sessions 2-3x/week.   Caroll Rancher, LRT,CTRS 09/27/2021 11:34 AM

## 2021-09-27 NOTE — Progress Notes (Signed)
Pt visible in milieu at longer intervals this shift. Denies SI, HI, AVH and pain. Continues to be disorganized with tangential and loose speech. Affect is congruent, she's pleasant on interactions. Continues to need multiple verbal redirections to comply with unit rules due to confusion. However, she's cooperative with care and is medication compliant.Q 15 minutes safety checks maintained without outburst. Support, encouragement and reassurance provided to pt this shift. All medications given as ordered with verbal education. Pt's appetite is poor "I don't like yellow eggs, I don't like the food. I can't eat it; supplement given.

## 2021-09-27 NOTE — Progress Notes (Signed)
   09/27/21 2045  Psych Admission Type (Psych Patients Only)  Admission Status Involuntary  Psychosocial Assessment  Patient Complaints Suspiciousness;Worrying  Eye Contact Fair  Facial Expression Anxious;Animated  Affect Appropriate to circumstance  Speech Soft  Interaction Assertive  Motor Activity Slow  Appearance/Hygiene Disheveled  Behavior Characteristics Cooperative  Mood Suspicious;Preoccupied  Aggressive Behavior  Effect No apparent injury  Thought Process  Coherency Circumstantial;Disorganized;Tangential  Content Preoccupation  Delusions Grandeur  Perception WDL  Hallucination None reported or observed  Judgment Impaired  Confusion Moderate  Danger to Self  Current suicidal ideation? Denies  Danger to Others  Danger to Others None reported or observed

## 2021-09-27 NOTE — Progress Notes (Signed)
The patient rated her day as a 6 out of 10 but did not discuss much about her day. She talked about donating blood and getting paid for it and that this hospital needs to stop drawing her blood.The patient was redirected so that she could discuss what happened today.

## 2021-09-28 DIAGNOSIS — F3112 Bipolar disorder, current episode manic without psychotic features, moderate: Secondary | ICD-10-CM | POA: Diagnosis not present

## 2021-09-28 MED ORDER — PALIPERIDONE PALMITATE ER 234 MG/1.5ML IM SUSY
234.0000 mg | PREFILLED_SYRINGE | Freq: Once | INTRAMUSCULAR | Status: DC
  Filled 2021-09-28: qty 1.5

## 2021-09-28 NOTE — Progress Notes (Signed)
[facility] MD Progress Note  09/28/2021 4:50 PM Natalie Brown  MRN:  [id number]  Subjective: Natalie Brown states "I'm okay, just stressed because I'm still here"..  Reason for Admission:  Natalie Brown is a 56 y.o. female with a history of bipolar d/o, who was initially admitted for inpatient psychiatric hospitalization on 09/06/2021 for management of delusions and disorganized thinking.   Today's patient assessment Note: Natalie Brown is seen today in her room. Chart reviewed. The chart findings discussed with the treatment team. She presents alert & oriented to place, month, time & fairly to situation. [name] visible on the unit. [name] attending group sessions. Natalie Brown presents with a good affect, good eye contact & friendly. She speaks very clearly, however, her speech remains tangential as well as circumstantial. Her thought contents/process are disorganized & illogical.  Natalie Brown seem to be doing a little better than from the time she was admitted to the [facility] which is going on 3 weeks as of date. Admission reports indicated that she was homeless, unemployed & has no mental health provider. It appeared she was not taking or has not been on her mental health medications in a long time. Although seems to be doing okay, patient's delusional thinking & other symptoms seem to not have been responding adequately to her current treatment regimen. This warrants the need to add another antipsychotic therapy with an injectable format to help combat/stabilize her symptoms to maintain mood stability. Natalie Brown at this time is started on Invega 3 mg tab yesterday. She will be transitioned to the injectable format once she is able to tolerate the po form.  Natalie Brown currently denies SI/HI/AVH or paranoia. She denies any medication side effects. Will continue medications as listed below. Labs reviewed, stable. Vital signs remain stable as well. There are no behavioral issues reported by staff. A repeat CMP result pending. Ordered the first  dose of the Invega sustenna 234 mg IM to be administered on 09-30-21. The subsequent doses will follow after.   Principal Problem: Bipolar 1 disorder with moderate mania (HCC)  Diagnosis: Active Problems:   Bipolar affective disorder, current episode manic with psychotic symptoms (HCC)  Past Psychiatric History: see H&P  Past Medical History:  Past Medical History:  Diagnosis Date   Bipolar 1 disorder with moderate mania (HCC) 12/30/2013   Hypertension    Family History: see H&P  Family Psychiatric  History: see H&P  Social History:  Social History   Substance and Sexual Activity  Alcohol Use No     Social History   Substance and Sexual Activity  Drug Use No    Social History   Socioeconomic History   Marital status: Married    Spouse name: Not on file   Number of children: Not on file   Years of education: Not on file   Highest education level: Not on file  Occupational History   Not on file  Tobacco Use   Smoking status: Every Day   Smokeless tobacco: Current  Substance and Sexual Activity   Alcohol use: No   Drug use: No   Sexual activity: Not on file  Other Topics Concern   Not on file  Social History Narrative   Not on file   Social Determinants of Health   Financial Resource Strain: Not on file  Food Insecurity: Not on file  Transportation Needs: Not on file  Physical Activity: Not on file  Stress: Not on file  Social Connections: Not on file   Sleep: Good  Appetite:  Good  Current Medications: Current Facility-Administered Medications  Medication Dose Route Frequency Provider Last Rate Last Admin   alum & mag hydroxide-simeth (MAALOX/MYLANTA) 200-200-20 MG/5ML suspension 30 mL  30 mL Oral Q4H PRN Massengill, Nathan, MD       benztropine (COGENTIN) tablet 0.5 mg  0.5 mg Oral BID Abbott Pao, Nadir, MD   0.5 mg at 09/28/21 0811   benztropine (COGENTIN) tablet 1 mg  1 mg Oral BID PRN Comer Locket, MD   1 mg at 09/14/21 1254   benztropine  mesylate (COGENTIN) injection 1 mg  1 mg Intramuscular BID PRN Comer Locket, MD       divalproex (DEPAKOTE ER) 24 hr tablet 2,000 mg  2,000 mg Oral QHS Attiah, Nadir, MD   2,000 mg at 09/27/21 2034   docusate sodium (COLACE) capsule 100 mg  100 mg Oral Daily Mason Jim, Amy E, MD   100 mg at 09/28/21 1610   feeding supplement (KATE FARMS STANDARD 1.4) liquid 325 mL  325 mL Oral BID BM Massengill, Harrold Donath, MD   325 mL at 09/28/21 1008   fluticasone (FLONASE) 50 MCG/ACT nasal spray 1 spray  1 spray Each Nare Daily Massengill, Harrold Donath, MD   1 spray at 09/28/21 9604   haloperidol (HALDOL) tablet 10 mg  10 mg Oral BID Abbott Pao, Nadir, MD   10 mg at 09/28/21 5409   hydrOXYzine (ATARAX) tablet 25 mg  25 mg Oral TID PRN Phineas Inches, MD   25 mg at 09/10/21 1204   LORazepam (ATIVAN) tablet 1 mg  1 mg Oral Q6H PRN Mason Jim, Amy E, MD       magnesium hydroxide (MILK OF MAGNESIA) suspension 30 mL  30 mL Oral Daily PRN Massengill, Nathan, MD       nicotine polacrilex (NICORETTE) gum 2 mg  2 mg Oral PRN Abbott Pao, Nadir, MD   2 mg at 09/22/21 0821   OLANZapine zydis (ZYPREXA) disintegrating tablet 5 mg  5 mg Oral Q8H PRN Bartholomew Crews E, MD   5 mg at 09/14/21 1255   And   ziprasidone (GEODON) injection 20 mg  20 mg Intramuscular PRN Comer Locket, MD       paliperidone (INVEGA) 24 hr tablet 3 mg  3 mg Oral Daily Armandina Stammer I, NP   3 mg at 09/28/21 8119   Lab Results:  Results for orders placed or performed during the hospital encounter of 09/06/21 (from the past 48 hour(s))  Valproic acid level     Status: None   Collection Time: 09/26/21  6:21 PM  Result Value Ref Range   Valproic Acid Lvl 75 50.0 - 100.0 ug/mL    Comment: Performed at [facility], [address], [place], [place] [address]   Blood Alcohol level:  Lab Results  Component Value Date   ETH <10 09/05/2021   Metabolic Disorder Labs: Lab Results  Component Value Date   HGBA1C 5.3 09/08/2021   MPG 105.41  09/08/2021   No results found for: "PROLACTIN" Lab Results  Component Value Date   CHOL 173 09/08/2021   TRIG 110 09/08/2021   HDL 50 09/08/2021   CHOLHDL 3.5 09/08/2021   VLDL 22 09/08/2021   LDLCALC 101 (H) 09/08/2021   LDLCALC 102 (H) 06/21/2020   Physical Findings:  Musculoskeletal: Strength & Muscle Tone: within normal limits Gait & Station: normal Patient leans: N/A  Psychiatric Specialty Exam: Psychiatric Specialty Exam: Physical Exam Vitals and nursing note reviewed.  Constitutional:  Appearance: Normal appearance.  HENT:     Head: Normocephalic.     Nose: Nose normal.     Mouth/Throat:     Pharynx: Oropharynx is clear.  Cardiovascular:     Rate and Rhythm: Normal rate.     Pulses: Normal pulses.  Pulmonary:     Effort: Pulmonary effort is normal.  Genitourinary:    Comments: Deferred Musculoskeletal:        General: Normal range of motion.     Cervical back: Normal range of motion.  Skin:    General: Skin is warm.     Comments: No rash noted to face  Neurological:     General: No focal deficit present.     Mental Status: [name] alert.     Review of Systems  Constitutional:  Negative for fever.  Respiratory:  Negative for shortness of breath.   Cardiovascular:  Negative for chest pain.  Gastrointestinal:  Negative for diarrhea, nausea and vomiting.  Skin:        Perceives is acne on face  Neurological:  Negative for headaches.  Psychiatric/Behavioral:  Negative for depression and substance abuse.     Blood pressure 132/69, pulse 100, temperature 98.5 F (36.9 C), temperature source Oral, resp. rate 16, height  (1.676 m), weight 72.6 kg, SpO2 100 %.Body mass index is 25.83 kg/m.  General Appearance: Casual and Fairly Groomed  Eye Contact:  Good  Speech:  Clear and Coherent, disorganized.  Volume:   Moderately increased.  Mood:  Euphoric  Affect:  Appropriate and Congruent  Thought Process:  Disorganized and Descriptions of  Associations: Tangential  Orientation:  Full (Time, Place, and Person)  Thought Content:  Illogical, Rumination, and Tangential  Suicidal Thoughts:  No  Homicidal Thoughts:  No  Memory:  Immediate;   Fair Recent;   Fair Remote;   Poor  Judgement:  Impaired  Insight:  Fair  Psychomotor Activity:  Normal  Concentration:  Concentration: Fair and Attention Span: Fair  Recall:  [employer] of Knowledge:  Poor  Language:  Good  Akathisia:  Negative  Handed:  Right  AIMS (if indicated):     Assets:  Communication Skills Desire for Improvement Physical Health  ADL's:  Intact  Cognition:  Impaired,  Mild  Sleep:  Number of Hours: 6    Physical Exam Vitals and nursing note reviewed.  Constitutional:      Appearance: Normal appearance.  HENT:     Head: Normocephalic.     Nose: Nose normal.     Mouth/Throat:     Pharynx: Oropharynx is clear.  Cardiovascular:     Rate and Rhythm: Normal rate.     Pulses: Normal pulses.  Pulmonary:     Effort: Pulmonary effort is normal.  Genitourinary:    Comments: Deferred Musculoskeletal:        General: Normal range of motion.     Cervical back: Normal range of motion.  Skin:    General: Skin is warm.     Comments: No rash noted to face  Neurological:     General: No focal deficit present.     Mental Status: [name] alert.    Review of Systems  Constitutional:  Negative for fever.  Respiratory:  Negative for shortness of breath.   Cardiovascular:  Negative for chest pain.  Gastrointestinal:  Negative for diarrhea, nausea and vomiting.  Skin:        Perceives is acne on face  Neurological:  Negative for headaches.  Psychiatric/Behavioral:  Negative for depression and substance abuse.    Blood pressure 132/69, pulse 100, temperature 98.5 F (36.9 C), temperature source Oral, resp. rate 16, height 5\' 6"  (1.676 m), weight 72.6 kg, SpO2 100 %. Body mass index is 25.83 kg/m.  Treatment Plan Summary: Diagnoses / Active  Problems: Bipolar I MRE manic severe with psychotic features  PLAN: Safety and Monitoring:  -- Converted to IVC admission  -- Daily contact with patient to assess and evaluate symptoms and progress in treatment  -- Patient's case to be discussed in multi-disciplinary team meeting  -- Observation Level : q15 minute checks  -- Vital signs:  q12 hours  -- Precautions: suicide, elopement, and assault  -- Attempting off unit privileges and monitoring behavior  2. Psychiatric Diagnoses and Treatment:   Bipolar I MRE manic with psychotic features  -- Zyprexa was discontinued secondary to lack of efficacy  -Continue Haldol 10 mg BID for psychosis and mood stabilization, monitor  for SE.              - Continue invega 3 mg po daily for mood control (transition to injectable in few days).  -Continue Cogentin 0.5 twice daily for EPS prophylaxis and continue to monitor.             -Initiated Hinda GlatterInvega Sustenna 234 mg IM due on 09-30-21.  Lipid Panel: WNL except for LDL 101; HbgA1c: 5.3; QTc:49811ms   -- Continue Depakote 2000 mg at bedtime. Repeating Depakote level on 6/7 to ensure therapeutic level and no toxicity Will need more meaningful discussion of psychoeducation about med and need for birth control as she clears CBC WNL 5/28; AST 33 and ALT 37 on 5/28 and VPA level 20 on 5/28 -(recheck VPA level, CBC and LFTs in 3-4 days after dose increase) NH3 level 32 on 5/28  -- Ativan was discontinued few days ago to limit sedative agents.  -- Zyprexa/Geodon/ativan agitation protocol  -- Encouraged patient to participate in unit milieu and in scheduled group therapies     3. Medical Issues Being Addressed:   Mildly elevated creatinine   -- repeat CMP shows creatinine 1.08 and po hydration encouraged   Constipation  - continue colace 100mg  daily and encouraged PRN MOM   Season allergies  - continue Claritin 10mg  daily PRN  4. Discharge Planning:   -- Social work and case management to assist  with discharge planning and identification of hospital follow-up needs prior to discharge  -- Discharge Concerns: Need to establish a safety plan; Medication compliance and effectiveness  -- Discharge Goals: Return home with outpatient referrals for mental health follow-up including medication management/psychotherapy Patient is homeless with limited insurance coverage, we will plan discharging to homeless shelters with ACT team resources and follow-up. Referrals for ACT team have been placed by social work. Also recommending applying for disability and Medicaid to facilitate long-term placement which will be needed to avoid further decompensation and rehospitalization.  Armandina StammerAgnes Maralee Higuchi, NP, pmhnp, fnp-bc. Patient ID: Nobie Putnamngela M Vallandingham, female   DOB: 31-Jan-1966, 56 y.o.   MRN: [id number] Patient ID: Nobie Putnamngela M Parkerson, female   DOB: 31-Jan-1966, 56 y.o.   MRN: [id number] Patient ID: Nobie Putnamngela M Taulbee, female   DOB: 31-Jan-1966, 56 y.o.    MRN: [id number] Patient ID: Nobie Putnamngela M Panjwani, female   DOB: 31-Jan-1966, 56 y.o.   MRN: [id number]

## 2021-09-28 NOTE — Group Note (Signed)
Recreation Therapy Group Note   Group Topic:Stress Management  Group Date: 09/28/2021 Start Time: 1000 End Time: 1020 Facilitators: Caroll Rancher, LRT,CTRS Location: [address] Hall Dayroom   Goal Area(s) Addresses:  Patient will identify positive stress management techniques. Patient will identify benefits of using stress management post d/c.  Group Description:  Meditation.  LRT played a meditation focused on letting go of the past and focusing on the future.  The meditation also reiterated the point of not letting past obstacles cloud your moving forward. Patients were to listen and follow along as meditation played to fully engage in the process.   Affect/Mood: N/A   Participation Level: Did not attend    Clinical Observations/Individualized Feedback:      Plan: Continue to engage patient in RT group sessions 2-3x/week.   Caroll Rancher, LRT,CTRS 09/28/2021 10:42 AM

## 2021-09-28 NOTE — [facility] Group Notes (Signed)
Patient was asleep during Spirituality group time.  Chaplain Dyanne Carrel, Bcc PAger, [phone]

## 2021-09-28 NOTE — Progress Notes (Signed)
   09/28/21 2000  Psych Admission Type (Psych Patients Only)  Admission Status Involuntary  Psychosocial Assessment  Patient Complaints Suspiciousness  Eye Contact Fair  Facial Expression Anxious;Animated  Affect Appropriate to circumstance  Speech Soft  Interaction Assertive  Motor Activity Slow  Appearance/Hygiene Disheveled  Behavior Characteristics Cooperative  Mood Suspicious;Preoccupied  Aggressive Behavior  Effect No apparent injury  Thought Process  Coherency Circumstantial;Disorganized;Tangential  Content Preoccupation  Delusions Grandeur  Perception WDL  Hallucination None reported or observed  Judgment Impaired  Confusion Moderate  Danger to Self  Current suicidal ideation? Denies  Danger to Others  Danger to Others None reported or observed

## 2021-09-28 NOTE — Group Note (Addendum)
LCSW Group Therapy Note     Group Date: 09/28/2021 Start Time: 1100 End Time: 1200     Type of Therapy and Topic:  Group Therapy: Self Esteem   Participation Level:  Did not attend   Description of Group: Patients will define self esteem and discuss benefits of healthy self esteem.  Patients then identified triggers that cause poor self esteem and ways to cope with those triggers to maintain a healthy self esteem.  Patients then participated in activity where they identified their core beliefs and helpful behavior to accurately represent themselves.       Therapeutic Goals: Define Self Esteem Identify benefits of healthy self esteem.  Encourage and identify behaviors that increase self esteem Identify triggers that cause poor self esteem.  Identify core values and beliefs.        Summary of Patient Progress:   Patient was asleep.  Patient did not participate but was provided a packet on group topic.        Therapeutic Modalities:    Cognitive Behavioral Therapy Solution based therapy   Gardiner Sleeper Shany Marinez, LCSW 09/28/2021  1:18 PM

## 2021-09-28 NOTE — Progress Notes (Signed)
Pt has issue swallowing large ER Depakote, pt medication may need to be evaluated to possibly include liquid, or possibly breaking up the doses throughout the day so she could receive smaller pills. Pt is having very difficult time swallowing 4 500 mg ER Depakote

## 2021-09-28 NOTE — Progress Notes (Signed)
   09/28/21 0500  Sleep  Number of Hours 6

## 2021-09-29 DIAGNOSIS — F3112 Bipolar disorder, current episode manic without psychotic features, moderate: Secondary | ICD-10-CM | POA: Diagnosis not present

## 2021-09-29 MED ORDER — VALPROIC ACID 250 MG/5ML PO SOLN
1000.0000 mg | Freq: Two times a day (BID) | ORAL | Status: DC
  Filled 2021-09-29 (×2): qty 20

## 2021-09-29 MED ORDER — VALPROIC ACID 250 MG/5ML PO SOLN
1000.0000 mg | Freq: Two times a day (BID) | ORAL | Status: DC
  Filled 2021-09-29 (×3): qty 20

## 2021-09-29 MED ORDER — PALIPERIDONE PALMITATE ER 234 MG/1.5ML IM SUSY
234.0000 mg | PREFILLED_SYRINGE | Freq: Once | INTRAMUSCULAR | Status: AC
  Administered 2021-09-30: 234 mg via INTRAMUSCULAR
  Filled 2021-09-29: qty 1.5

## 2021-09-29 MED ORDER — VALPROIC ACID 250 MG/5ML PO SOLN
1000.0000 mg | Freq: Two times a day (BID) | ORAL | Status: DC
Start: 2021-09-29 — End: 2021-10-07
  Administered 2021-09-29 – 2021-10-07 (×16): 1000 mg via ORAL
  Filled 2021-09-29 (×21): qty 20

## 2021-09-29 NOTE — [facility] Counselor (Signed)
Goals Group 09/29/2021   Group Focus: affirmation, clarity of thought, and goals/reality orientation Treatment Modality:  Psychoeducation Interventions utilized were assignment, group exercise, and support Purpose: To be able to understand and verbalize the reason for their admission to the hospital. To understand that the medication helps with their chemical imbalance but they also need to work on their choices in life. To be challenged to develop Brown list of 30 positives about themselves. Also introduce the concept that "feelings" are not reality.  Participation Level:  did not attend Natalie Brown 

## 2021-09-29 NOTE — Progress Notes (Signed)
[facility] MD Progress Note  09/29/2021 12:50 PM Natalie Brown  MRN:  [id number]  Subjective: Natalie Brown states "I'm okay, just stressed because I'm still here"..  Reason for Admission:  Natalie Brown is a 56 y.o. female with a history of bipolar d/o, who was initially admitted for inpatient psychiatric hospitalization on 09/06/2021 for management of delusions and disorganized thinking.   Today's patient assessment Note: Chart review indicates no as needed medication given for agitation or aggression.  Chart review indicates patient is currently on Invega 3 mg daily p.o. Natalie Brown injection for his dose will be given tomorrow morning 234 mg IM she is also on Haldol 10 mg twice daily oral and Cogentin 1.5 mg twice daily in addition to Depakote p.o. 2000 mg at bedtime.  Evaluation this morning patient is sitting up in bed eating breakfast does not present euphoric or manic but is still confused with nonlinear disorganized thought process at times, she denies SI HI or AVH and does not present responding to stimuli, does not present with any self injures or aggressive behavior does not appear sleepy and does not demonstrate any sign of EPS upon exam.  She does complain of having trouble taking p.o. Depakote secondary to large capsules and I discussed with her switching her to liquid Depakote instead.  Patient reports good mood in general, reports good sleep and appetite and denies side effects medications.  She then starts talking in a preoccupied manner regarding wanting to be and declining to stay with her mom after discharge despite the fact that it is well-known that she cannot do staying with her mom at her place after discharge.  I will continue current medications and seems no changes except switching Depakote tablets to liquid Depakene.  Principal Problem: Bipolar 1 disorder with moderate mania (HCC)  Diagnosis: Active Problems:   Bipolar affective disorder, current episode manic with psychotic  symptoms (HCC)  Past Psychiatric History: see H&P  Past Medical History:  Past Medical History:  Diagnosis Date   Bipolar 1 disorder with moderate mania (HCC) 12/30/2013   Hypertension    Family History: see H&P  Family Psychiatric  History: see H&P  Social History:  Social History   Substance and Sexual Activity  Alcohol Use No     Social History   Substance and Sexual Activity  Drug Use No    Social History   Socioeconomic History   Marital status: Married    Spouse name: Not on file   Number of children: Not on file   Years of education: Not on file   Highest education level: Not on file  Occupational History   Not on file  Tobacco Use   Smoking status: Every Day   Smokeless tobacco: Current  Substance and Sexual Activity   Alcohol use: No   Drug use: No   Sexual activity: Not on file  Other Topics Concern   Not on file  Social History Narrative   Not on file   Social Determinants of Health   Financial Resource Strain: Not on file  Food Insecurity: Not on file  Transportation Needs: Not on file  Physical Activity: Not on file  Stress: Not on file  Social Connections: Not on file   Sleep: Good  Appetite:  Good  Current Medications: Current Facility-Administered Medications  Medication Dose Route Frequency Provider Last Rate Last Admin   alum & mag hydroxide-simeth (MAALOX/MYLANTA) 200-200-20 MG/5ML suspension 30 mL  30 mL Oral Q4H PRN Phineas Inches, MD  benztropine (COGENTIN) tablet 0.5 mg  0.5 mg Oral BID Abbott Pao, Amiley Shishido, MD   0.5 mg at 09/29/21 0836   benztropine (COGENTIN) tablet 1 mg  1 mg Oral BID PRN Comer Locket, MD   1 mg at 09/14/21 1254   benztropine mesylate (COGENTIN) injection 1 mg  1 mg Intramuscular BID PRN Comer Locket, MD       docusate sodium (COLACE) capsule 100 mg  100 mg Oral Daily Mason Jim, Amy E, MD   100 mg at 09/29/21 0836   feeding supplement (KATE FARMS STANDARD 1.4) liquid 325 mL  325 mL Oral BID BM  Massengill, Harrold Donath, MD   325 mL at 09/29/21 1024   fluticasone (FLONASE) 50 MCG/ACT nasal spray 1 spray  1 spray Each Nare Daily Massengill, Harrold Donath, MD   1 spray at 09/29/21 0836   haloperidol (HALDOL) tablet 10 mg  10 mg Oral BID Abbott Pao, Beila Purdie, MD   10 mg at 09/29/21 0836   hydrOXYzine (ATARAX) tablet 25 mg  25 mg Oral TID PRN Phineas Inches, MD   25 mg at 09/10/21 1204   LORazepam (ATIVAN) tablet 1 mg  1 mg Oral Q6H PRN Comer Locket, MD       magnesium hydroxide (MILK OF MAGNESIA) suspension 30 mL  30 mL Oral Daily PRN Massengill, Harrold Donath, MD       nicotine polacrilex (NICORETTE) gum 2 mg  2 mg Oral PRN Abbott Pao, Ziah Turvey, MD   2 mg at 09/22/21 0821   OLANZapine zydis (ZYPREXA) disintegrating tablet 5 mg  5 mg Oral Q8H PRN Bartholomew Crews E, MD   5 mg at 09/14/21 1255   And   ziprasidone (GEODON) injection 20 mg  20 mg Intramuscular PRN Comer Locket, MD       [START ON 09/30/2021] paliperidone (INVEGA Brown) injection 234 mg  234 mg Intramuscular Once Armandina Stammer I, NP       paliperidone (INVEGA) 24 hr tablet 3 mg  3 mg Oral Daily Nwoko, Agnes I, NP   3 mg at 09/29/21 1245   valproic acid (DEPAKENE) 250 MG/5ML solution 1,000 mg  1,000 mg Oral BID Abbott Pao, Carvel Huskins, MD       Lab Results:  No results found for this or any previous visit (from the past 48 hour(s)).  Blood Alcohol level:  Lab Results  Component Value Date   ETH <10 09/05/2021   Metabolic Disorder Labs: Lab Results  Component Value Date   HGBA1C 5.3 09/08/2021   MPG 105.41 09/08/2021   No results found for: "PROLACTIN" Lab Results  Component Value Date   CHOL 173 09/08/2021   TRIG 110 09/08/2021   HDL 50 09/08/2021   CHOLHDL 3.5 09/08/2021   VLDL 22 09/08/2021   LDLCALC 101 (H) 09/08/2021   LDLCALC 102 (H) 06/21/2020   Physical Findings:  Musculoskeletal: Strength & Muscle Tone: within normal limits Gait & Station: normal Patient leans: N/A  Psychiatric Specialty Exam: Psychiatric Specialty  Exam: Physical Exam Vitals and nursing note reviewed.  Constitutional:      Appearance: Normal appearance.  HENT:     Head: Normocephalic.     Nose: Nose normal.     Mouth/Throat:     Pharynx: Oropharynx is clear.  Cardiovascular:     Rate and Rhythm: Normal rate.     Pulses: Normal pulses.  Pulmonary:     Effort: Pulmonary effort is normal.  Genitourinary:    Comments: Deferred Musculoskeletal:        General:  Normal range of motion.     Cervical back: Normal range of motion.  Skin:    General: Skin is warm.     Comments: No rash noted to face  Neurological:     General: No focal deficit present.     Mental Status: [name] alert.     Review of Systems  Constitutional:  Negative for fever.  Respiratory:  Negative for shortness of breath.   Cardiovascular:  Negative for chest pain.  Gastrointestinal:  Negative for diarrhea, nausea and vomiting.  Skin:        Perceives is acne on face  Neurological:  Negative for headaches.  Psychiatric/Behavioral:  Negative for depression and substance abuse.     Blood pressure 106/74, pulse (!) 101, temperature 99 F (37.2 C), temperature source Oral, resp. rate 16, height 5\' 6"  (1.676 m), weight 72.6 kg, SpO2 100 %.Body mass index is 25.83 kg/m.  General Appearance: Casual and Fairly Groomed  Eye Contact:  Good  Speech:  Clear and Coherent, disorganized.  Volume: Within normal limits  Mood: Euthymic, not depressed or euphoric  Affect:  Appropriate and Congruent  Thought Process:  Disorganized and Descriptions of Associations: Tangential  Orientation:  Full (Time, Place, and Person)  Thought Content: Disorganized at times but no paranoia or other delusions noted  Suicidal Thoughts:  No  Homicidal Thoughts:  No  Memory:  Immediate;   Fair Recent;   Fair Remote;   Poor  Judgement:  Impaired  Insight:  Fair  Psychomotor Activity:  Normal  Concentration:  Concentration: Fair and Attention Span: Fair  Recall:  of  Knowledge:  Poor  Language:  Good  Akathisia:  Negative  Handed:  Right  AIMS (if indicated):     Assets:  Communication Skills Desire for Improvement Physical Health  ADL's:  Intact  Cognition:  Impaired,  Mild  Sleep:  Number of Hours: 7.5    Physical Exam Vitals and nursing note reviewed.  Constitutional:      Appearance: Normal appearance.  HENT:     Head: Normocephalic.     Nose: Nose normal.     Mouth/Throat:     Pharynx: Oropharynx is clear.  Cardiovascular:     Rate and Rhythm: Normal rate.     Pulses: Normal pulses.  Pulmonary:     Effort: Pulmonary effort is normal.  Genitourinary:    Comments: Deferred Musculoskeletal:        General: Normal range of motion.     Cervical back: Normal range of motion.  Skin:    General: Skin is warm.     Comments: No rash noted to face  Neurological:     General: No focal deficit present.     Mental Status: [name] alert.    Review of Systems  Constitutional:  Negative for fever.  Respiratory:  Negative for shortness of breath.   Cardiovascular:  Negative for chest pain.  Gastrointestinal:  Negative for diarrhea, nausea and vomiting.  Skin:        Perceives is acne on face  Neurological:  Negative for headaches.  Psychiatric/Behavioral:  Negative for depression and substance abuse.    Blood pressure 106/74, pulse (!) 101, temperature 99 F (37.2 C), temperature source Oral, resp. rate 16, height 5\' 6"  (1.676 m), weight 72.6 kg, SpO2 100 %. Body mass index is 25.83 kg/m.  Treatment Plan Summary: Diagnoses / Active Problems: Bipolar I MRE manic severe with psychotic features  PLAN: Safety and Monitoring:  --  Converted to IVC admission  -- Daily contact with patient to assess and evaluate symptoms and progress in treatment  -- Patient's case to be discussed in multi-disciplinary team meeting  -- Observation Level : q15 minute checks  -- Vital signs:  q12 hours  -- Precautions: suicide, elopement, and assault  --  Attempting off unit privileges and monitoring behavior  2. Psychiatric Diagnoses and Treatment:   Bipolar I MRE manic with psychotic features  -- Zyprexa was discontinued secondary to lack of efficacy  -Continue Haldol 10 mg BID for psychosis and mood stabilization, monitor  for SE.              - Continue invega 3 mg po daily for mood control (transition to injectable in few days).  -Continue Cogentin 0.5 twice daily for EPS prophylaxis and continue to monitor.             -Initiated Natalie GlatterInvega Brown 234 mg IM due on 09-30-21.  Lipid Panel: WNL except for LDL 101; HbgA1c: 5.3; QTc:43311ms   -- Continue Depakote 2000 mg but switch to p.o. liquid form 1000 mg twice daily.  Depakote level on 6/7 therapeutic at 75.   Will need more meaningful discussion of psychoeducation about med and need for birth control as she clears CBC, LFT on 6/3 within normal limits   -- Ativan was discontinued few days ago to limit sedative agents.  -- Zyprexa/Geodon/ativan agitation protocol  -- Encouraged patient to participate in unit milieu and in scheduled group therapies     3. Medical Issues Being Addressed:   Mildly elevated creatinine   -- repeat CMP shows creatinine 1.08 and po hydration encouraged   Constipation  - continue colace 100mg  daily and encouraged PRN MOM   Season allergies  - continue Claritin 10mg  daily PRN  4. Discharge Planning:   -- Social work and case management to assist with discharge planning and identification of hospital follow-up needs prior to discharge  -- Discharge Concerns: Need to establish a safety plan; Medication compliance and effectiveness  -- Discharge Goals: Return home with outpatient referrals for mental health follow-up including medication management/psychotherapy Patient is homeless with limited insurance coverage, we will plan discharging to homeless shelters with ACT team resources and follow-up. Referrals for ACT team have been placed by social work. Also  recommending applying for disability and Medicaid to facilitate long-term placement which will be needed to avoid further decompensation and rehospitalization.  Rylen Swindler Abbott PaoAttiah, MD, pmhnp, fnp-bc. Patient ID: Nobie Putnamngela M Heidrick, female   DOB: 02/16/1966, 56 y.o.   MRN: [id number] Patient ID: Nobie Putnamngela M Clayson, female   DOB: 02/16/1966, 56 y.o.   MRN: [id number] Patient ID: Nobie Putnamngela M Geise, female   DOB: 02/16/1966, 56 y.o.    MRN: [id number] Patient ID: Nobie Putnamngela M Cropper, female   DOB: 02/16/1966, 56 y.o.   MRN: [id number]

## 2021-09-29 NOTE — Progress Notes (Signed)
   09/29/21 2109  Psych Admission Type (Psych Patients Only)  Admission Status Involuntary  Psychosocial Assessment  Patient Complaints Suspiciousness  Eye Contact Fair  Facial Expression Animated;Anxious  Affect Appropriate to circumstance  Speech Soft  Interaction Minimal  Motor Activity Other (Comment) (WDL)  Appearance/Hygiene Disheveled  Behavior Characteristics Appropriate to situation  Mood Preoccupied  Thought Process  Coherency Circumstantial  Content Preoccupation  Delusions None reported or observed  Perception WDL  Hallucination None reported or observed  Judgment Impaired  Confusion Moderate  Danger to Self  Current suicidal ideation? Denies  Agreement Not to Harm Self Yes  Description of Agreement verbal  Danger to Others  Danger to Others None reported or observed

## 2021-09-29 NOTE — Progress Notes (Signed)
The focus of this group is to help patients review their daily goal of treatment and discuss progress on daily workbooks. Pt did not attend the evening group. 

## 2021-09-29 NOTE — Progress Notes (Signed)
   09/29/21 0515  Sleep  Number of Hours 7.5

## 2021-09-29 NOTE — Progress Notes (Signed)
Adult Psychoeducational Group Note  Date:  09/29/2021 Time:  9:04 PM  Group Topic/Focus:  Wrap-Up Group:   The focus of this group is to help patients review their daily goal of treatment and discuss progress on daily workbooks.  Participation Level:  Active  Participation Quality:  Appropriate  Affect:  Anxious  Cognitive:  Disorganized and Confused  Insight: Limited  Engagement in Group:  Limited  Modes of Intervention:  Discussion  Additional Comments:   Pt stated her goal for today was to focus on her treatment plan. Pt stated she accomplished her goal today. Pt stated she did not talked with her doctor or with her social worker about her care today. Pt rated her overall day a 10. Pt stated she was able to contact her mother and her mother coming for visitation today improved her overall day. Pt stated she felt better about herself tonight. Pt stated staff brought back all meals today. Pt stated she took all medications provided today. Pt stated her appetite was fair today. Pt rated her sleep last night was pretty good. Pt stated the goal tonight was to get some rest. Pt stated she had no physical pain tonight. Pt deny visual hallucinations and auditory issues tonight. Pt denies thoughts of harming herself or others. Pt stated she would alert staff if anything changed.  Felipa Furnace 09/29/2021, 9:04 PM

## 2021-09-29 NOTE — Group Note (Signed)
Group not held due to low staffing and high patient needs.  Ambrose Mantle, LCSW 09/29/2021, 4:37 PM

## 2021-09-30 DIAGNOSIS — F3112 Bipolar disorder, current episode manic without psychotic features, moderate: Secondary | ICD-10-CM | POA: Diagnosis not present

## 2021-09-30 NOTE — [facility] Group Notes (Signed)
Adult Psychoeducational Group Not Date:  09/30/2021 Time:  0900-1045 Group Topic/Focus: PROGRESSIVE RELAXATION. A group where deep breathing is taught and tensing and relaxation muscle groups is used. Imagery is used as well.  Pts are asked to imagine 3 pillars that hold them up when they are not able to hold themselves up and to share that with the group.  Participation Level:  did not attend  Saajan Willmon A   

## 2021-09-30 NOTE — Progress Notes (Signed)
[facility] Group Notes:  (Nursing/MHT/Case Management/Adjunct)  Date:  09/30/2021  Time: 2000  Type of Therapy:   wrap up group  Participation Level:  Active  Participation Quality:  Appropriate, Attentive, Sharing, and Supportive  Affect:  Appropriate  Cognitive:  Alert  Insight:  Limited  Engagement in Group:  Engaged  Modes of Intervention:  Clarification, Education, and Support  Summary of Progress/Problems: Positive thinking and positive change were discussed. Pt reported having a good meeting with the Dr today. Pt shared when she leaves she wants to move some of her things to or from her mothers home to or from storage. Pt is grateful for family. Pt insists that she is leaving tomorrow and is focused on her court legal papers due to her IVC.   Marcille Buffy 09/30/2021, 9:32 PM

## 2021-09-30 NOTE — Progress Notes (Signed)
   09/30/21 1100  Psych Admission Type (Psych Patients Only)  Admission Status Involuntary  Psychosocial Assessment  Patient Complaints Suspiciousness  Eye Contact Fair  Facial Expression Animated;Anxious  Affect Appropriate to circumstance  Speech Soft  Interaction Minimal  Motor Activity Other (Comment) (WDL)  Appearance/Hygiene Disheveled  Behavior Characteristics Appropriate to situation  Mood Preoccupied  Thought Process  Coherency Circumstantial  Content Preoccupation  Delusions None reported or observed  Perception WDL  Hallucination None reported or observed  Judgment Impaired  Confusion Moderate  Danger to Self  Current suicidal ideation? Denies  Agreement Not to Harm Self Yes  Description of Agreement Verbal  Danger to Others  Danger to Others None reported or observed

## 2021-09-30 NOTE — Progress Notes (Signed)
[facility] MD Progress Note  09/30/2021 12:42 PM Natalie Brown  MRN:  [id number]  Subjective: Natalie Brown states "I'm okay, just stressed because I'm still here"..  Reason for Admission:  Natalie Brown is a 56 y.o. female with a history of bipolar d/o, who was initially admitted for inpatient psychiatric hospitalization on 09/06/2021 for management of delusions and disorganized thinking.   Today's patient assessment Note: Chart review indicates no as needed medication given for agitation or aggression.  Chart review indicates patient received first injection of [place] this morning, she is also on Haldol 10 mg twice daily oral and Cogentin 1.5 mg twice daily in addition to Depakene liquid 1000 mg twice daily.  Upon assessment this morning patient is sitting on the bed, does not appear euphoric or hyper, calm and not anxious, denies depressed mood denies SI HI or AVH does not present responding to stimuli seems to have linear speech in general occasionally derailing but easy to redirect, preoccupied with wanting to leave and go stay with her mom for 2 weeks despite the fact that it was confirmed that she cannot stay with her mom after discharge.  She reports fair sleep and appetite and denies side effect to medications, does not have any signs of EPS or sedation during evaluation.  Principal Problem: Bipolar 1 disorder with moderate mania (HCC)  Diagnosis: Active Problems:   Bipolar affective disorder, current episode manic with psychotic symptoms (HCC)  Past Psychiatric History: see H&P  Past Medical History:  Past Medical History:  Diagnosis Date   Bipolar 1 disorder with moderate mania (HCC) 12/30/2013   Hypertension    Family History: see H&P  Family Psychiatric  History: see H&P  Social History:  Social History   Substance and Sexual Activity  Alcohol Use No     Social History   Substance and Sexual Activity  Drug Use No    Social History   Socioeconomic History    Marital status: Married    Spouse name: Not on file   Number of children: Not on file   Years of education: Not on file   Highest education level: Not on file  Occupational History   Not on file  Tobacco Use   Smoking status: Every Day   Smokeless tobacco: Current  Substance and Sexual Activity   Alcohol use: No   Drug use: No   Sexual activity: Not on file  Other Topics Concern   Not on file  Social History Narrative   Not on file   Social Determinants of Health   Financial Resource Strain: Not on file  Food Insecurity: Not on file  Transportation Needs: Not on file  Physical Activity: Not on file  Stress: Not on file  Social Connections: Not on file   Sleep: Good  Appetite:  Good  Current Medications: Current Facility-Administered Medications  Medication Dose Route Frequency Provider Last Rate Last Admin   alum & mag hydroxide-simeth (MAALOX/MYLANTA) 200-200-20 MG/5ML suspension 30 mL  30 mL Oral Q4H PRN Massengill, Nathan, MD       benztropine (COGENTIN) tablet 0.5 mg  0.5 mg Oral BID Rourke Mcquitty, MD   0.5 mg at 09/30/21 0830   benztropine (COGENTIN) tablet 1 mg  1 mg Oral BID PRN Comer Locket, MD   1 mg at 09/14/21 1254   benztropine mesylate (COGENTIN) injection 1 mg  1 mg Intramuscular BID PRN Comer Locket, MD       docusate sodium (COLACE) capsule 100 mg  100 mg Oral Daily Nelda Marseille, Amy E, MD   100 mg at 09/30/21 0830   feeding supplement (KATE FARMS STANDARD 1.4) liquid 325 mL  325 mL Oral BID BM Massengill, Nathan, MD   325 mL at 09/29/21 1411   fluticasone (FLONASE) 50 MCG/ACT nasal spray 1 spray  1 spray Each Nare Daily Massengill, Nathan, MD   1 spray at 09/30/21 0830   haloperidol (HALDOL) tablet 10 mg  10 mg Oral BID Winfred Leeds, Philbert Ocallaghan, MD   10 mg at 09/30/21 0830   hydrOXYzine (ATARAX) tablet 25 mg  25 mg Oral TID PRN Janine Limbo, MD   25 mg at 09/10/21 1204   LORazepam (ATIVAN) tablet 1 mg  1 mg Oral Q6H PRN Nelda Marseille, Amy E, MD        magnesium hydroxide (MILK OF MAGNESIA) suspension 30 mL  30 mL Oral Daily PRN Massengill, [name], MD       nicotine polacrilex (NICORETTE) gum 2 mg  2 mg Oral PRN Winfred Leeds, Harlan Vinal, MD   2 mg at 09/22/21 0821   OLANZapine zydis (ZYPREXA) disintegrating tablet 5 mg  5 mg Oral Q8H PRN Viann Fish E, MD   5 mg at 09/14/21 1255   And   ziprasidone (GEODON) injection 20 mg  20 mg Intramuscular PRN Harlow Asa, MD       paliperidone (INVEGA) 24 hr tablet 3 mg  3 mg Oral Daily Nwoko, Agnes I, NP   3 mg at 09/30/21 0830   valproic acid (DEPAKENE) 250 MG/5ML solution 1,000 mg  1,000 mg Oral BID Winfred Leeds, Sirinity Outland, MD   1,000 mg at 09/30/21 [id number]   Lab Results:  No results found for this or any previous visit (from the past 76 hour(s)).  Blood Alcohol level:  Lab Results  Component Value Date   ETH <10 [id number]   Metabolic Disorder Labs: Lab Results  Component Value Date   HGBA1C 5.3 09/08/2021   MPG 105.41 09/08/2021   No results found for: "PROLACTIN" Lab Results  Component Value Date   CHOL 173 09/08/2021   TRIG 110 09/08/2021   HDL 50 09/08/2021   CHOLHDL 3.5 09/08/2021   VLDL 22 09/08/2021   LDLCALC 101 (H) 09/08/2021   LDLCALC 102 (H) 06/21/2020   Physical Findings:  Musculoskeletal: Strength & Muscle Tone: within normal limits Gait & Station: normal Patient leans: N/A  Psychiatric Specialty Exam: Psychiatric Specialty Exam: Physical Exam Vitals and nursing note reviewed.  Constitutional:      Appearance: Normal appearance.  HENT:     Head: Normocephalic.     Nose: Nose normal.     Mouth/Throat:     Pharynx: Oropharynx is clear.  Cardiovascular:     Rate and Rhythm: Normal rate.     Pulses: Normal pulses.  Pulmonary:     Effort: Pulmonary effort is normal.  Genitourinary:    Comments: Deferred Musculoskeletal:        General: Normal range of motion.     Cervical back: Normal range of motion.  Skin:    General: Skin is warm.     Comments: No rash noted to face   Neurological:     General: No focal deficit present.     Mental Status: [name] alert.     Review of Systems  Constitutional:  Negative for fever.  Respiratory:  Negative for shortness of breath.   Cardiovascular:  Negative for chest pain.  Gastrointestinal:  Negative for diarrhea, nausea and vomiting.  Skin:  Perceives is acne on face  Neurological:  Negative for headaches.  Psychiatric/Behavioral:  Negative for depression and substance abuse.     Blood pressure 111/73, pulse 100, temperature 97.6 F (36.4 C), temperature source Oral, resp. rate 16, height 5\' 6"  (1.676 m), weight 72.6 kg, SpO2 94 %.Body mass index is 25.83 kg/m.  General Appearance: Casual and Fairly Groomed  Eye Contact:  Good  Speech:  Clear and Coherent, disorganized.  Volume: Within normal limits  Mood: Euthymic, not depressed or euphoric  Affect:  Appropriate and Congruent  Thought Process:  Disorganized and Descriptions of Associations: Tangential  Orientation:  Full (Time, Place, and Person)  Thought Content: Disorganized at times but no paranoia or other delusions noted  Suicidal Thoughts:  No  Homicidal Thoughts:  No  Memory:  Immediate;   Fair Recent;   Fair Remote;   Poor  Judgement:  Impaired  Insight:  Fair  Psychomotor Activity:  Normal  Concentration:  Concentration: Fair and Attention Span: Fair  Recall:  [employer] of Knowledge:  Poor  Language:  Good  Akathisia:  Negative  Handed:  Right  AIMS (if indicated):     Assets:  Communication Skills Desire for Improvement Physical Health  ADL's:  Intact  Cognition:  Impaired,  Mild  Sleep:  Number of Hours: 7.5    Physical Exam Vitals and nursing note reviewed.  Constitutional:      Appearance: Normal appearance.  HENT:     Head: Normocephalic.     Nose: Nose normal.     Mouth/Throat:     Pharynx: Oropharynx is clear.  Cardiovascular:     Rate and Rhythm: Normal rate.     Pulses: Normal pulses.  Pulmonary:     Effort:  Pulmonary effort is normal.  Genitourinary:    Comments: Deferred Musculoskeletal:        General: Normal range of motion.     Cervical back: Normal range of motion.  Skin:    General: Skin is warm.     Comments: No rash noted to face  Neurological:     General: No focal deficit present.     Mental Status: [name] alert.    Review of Systems  Constitutional:  Negative for fever.  Respiratory:  Negative for shortness of breath.   Cardiovascular:  Negative for chest pain.  Gastrointestinal:  Negative for diarrhea, nausea and vomiting.  Skin:        Perceives is acne on face  Neurological:  Negative for headaches.  Psychiatric/Behavioral:  Negative for depression and substance abuse.    Blood pressure 111/73, pulse 100, temperature 97.6 F (36.4 C), temperature source Oral, resp. rate 16, height 5\' 6"  (1.676 m), weight 72.6 kg, SpO2 94 %. Body mass index is 25.83 kg/m.  Treatment Plan Summary: Diagnoses / Active Problems: Bipolar I MRE manic severe with psychotic features  PLAN: Safety and Monitoring:  -- Converted to IVC admission  -- Daily contact with patient to assess and evaluate symptoms and progress in treatment  -- Patient's case to be discussed in multi-disciplinary team meeting  -- Observation Level : q15 minute checks  -- Vital signs:  q12 hours  -- Precautions: suicide, elopement, and assault  -- Attempting off unit privileges and monitoring behavior  2. Psychiatric Diagnoses and Treatment:   Bipolar I MRE manic with psychotic features  -- Zyprexa was discontinued secondary to lack of efficacy  -Continue Haldol 10 mg BID for psychosis and mood stabilization, monitor  for SE.              -  Continue invega 3 mg po daily for mood control.  -Continue Cogentin 0.5 twice daily for EPS prophylaxis and continue to monitor.             -Continue Invega Sustenna 234 mg IM given on 09-30-21.  Lipid Panel: WNL except for LDL 101; HbgA1c: 5.3; QTc:467ms   -- Continue  Depakene 1000 mg twice daily.  Depakote level on 6/7 therapeutic at 75.   Will need more meaningful discussion of psychoeducation about med and need for birth control as she clears CBC, LFT on 6/3 within normal limits   -- Ativan was discontinued few days ago to limit sedative agents.  -- Zyprexa/Geodon/ativan agitation protocol  -- Encouraged patient to participate in unit milieu and in scheduled group therapies     3. Medical Issues Being Addressed:   Mildly elevated creatinine   -- repeat CMP shows creatinine 1.08 and po hydration encouraged   Constipation  - continue colace 100mg  daily and encouraged PRN MOM   Season allergies  - continue Claritin 10mg  daily PRN  4. Discharge Planning:   -- Social work and case management to assist with discharge planning and identification of hospital follow-up needs prior to discharge  -- Discharge Concerns: Need to establish a safety plan; Medication compliance and effectiveness  -- Discharge Goals: Return home with outpatient referrals for mental health follow-up including medication management/psychotherapy Patient is homeless with limited insurance coverage, we will plan discharging to homeless shelters with ACT team resources and follow-up. Referrals for ACT team have been placed by social work. Also recommending applying for disability and Medicaid to facilitate long-term placement which will be needed to avoid further decompensation and rehospitalization.  Dian Situ, MD Patient ID: Natalie Brown, female   DOB: 1966-03-21, 56 y.o.   MRN: [id number] Patient ID: Natalie Brown, female   DOB: [birth year], 56 y.o.   MRN: [id number] Patient ID: Natalie Brown, female   DOB: 1965-10-27, 56 y.o.    MRN: [id number] Patient ID: Natalie Brown, female   DOB: 11-17-1965, 56 y.o.   MRN: [id number]

## 2021-09-30 NOTE — Group Note (Signed)
LCSW Group Therapy Note   Group was not held due to acuity on the unit and low staffing for CSW team.  [name], LCSWA 09/30/2021  12:40 PM   

## 2021-10-01 ENCOUNTER — Encounter (HOSPITAL_COMMUNITY): Payer: Self-pay

## 2021-10-01 DIAGNOSIS — F3112 Bipolar disorder, current episode manic without psychotic features, moderate: Secondary | ICD-10-CM | POA: Diagnosis not present

## 2021-10-01 NOTE — Progress Notes (Signed)
[facility] MD Progress Note  10/01/2021 12:40 PM QUANIYA DAMAS  MRN:  [id number]  Subjective: Brendaly states "I'm okay, just stressed because I'm still here"..  Reason for Admission:  HUDSYN BARICH is a 56 y.o. female with a history of bipolar d/o, who was initially admitted for inpatient psychiatric hospitalization on 09/06/2021 for management of delusions and disorganized thinking.   Today's patient assessment Note: Chart review indicates no as needed medication given for agitation or aggression.  Patient continues to comply with her medications on the unit with no side effects reported.  Reported per staff to be preoccupied with wanting discharge but unable to identify a reasonable plan.  Upon evaluation this morning she presents calm.  No anxiety or irritability, no self-injurious or aggressive behavior, does not appear responding to stimuli paranoid linear yet concrete, preoccupied with wanting to be discharged noting that she can go stay with her mother for 2 weeks but in fact it is well-known that she cannot do that and she does not have any alternative safe plan.  [name] easily redirected when derailing during interview occasionally.  She denies depressed mood denies SI HI or AVH, does not present depressed or euphoric or manic, no racing thoughts or flight of ideas noted, no paranoia or other delusions noted. We will continue current medications same dose no changes except we will discontinue oral Invega, patient received Gean Birchwood injection yesterday.  Ongoing confusion noted during this hospitalization as well as poor insight and judgment regarding her mental health needs and need for follow-up after discharge and need to establish safe residence to limit decompensation. Given today's evaluation and the earlier evaluations during this hospital stay, in my opinion patient at baseline lacks capacity to make medical or social decisions regarding placement or finances, she needs a guardian to  make decisions on her behalf in her best interest.  I did contact patient's mother today discussed with her current plan of care and today's patient's presentation, discussed with patient's mother recommendation for to pursue emergency guardianship through court which hopefully can facilitate patient getting disability and healthcare coverage to facilitate placement to group home.  Unfortunately without such process only available discharge option will be going to homeless shelter which carries high risk of decompensation given patient's poor insight and judgment regarding her mental illness and needs.    Principal Problem: Bipolar 1 disorder with moderate mania (HCC)  Diagnosis: Active Problems:   Bipolar affective disorder, current episode manic with psychotic symptoms (HCC)  Past Psychiatric History: see H&P  Past Medical History:  Past Medical History:  Diagnosis Date   Bipolar 1 disorder with moderate mania (HCC) 12/30/2013   Hypertension    Family History: see H&P  Family Psychiatric  History: see H&P  Social History:  Social History   Substance and Sexual Activity  Alcohol Use No     Social History   Substance and Sexual Activity  Drug Use No    Social History   Socioeconomic History   Marital status: Married    Spouse name: Not on file   Number of children: Not on file   Years of education: Not on file   Highest education level: Not on file  Occupational History   Not on file  Tobacco Use   Smoking status: Every Day   Smokeless tobacco: Current  Substance and Sexual Activity   Alcohol use: No   Drug use: No   Sexual activity: Not on file  Other Topics Concern  Not on file  Social History Narrative   Not on file   Social Determinants of Health   Financial Resource Strain: Not on file  Food Insecurity: Not on file  Transportation Needs: Not on file  Physical Activity: Not on file  Stress: Not on file  Social Connections: Not on file   Sleep:  Good  Appetite:  Good  Current Medications: Current Facility-Administered Medications  Medication Dose Route Frequency Provider Last Rate Last Admin   alum & mag hydroxide-simeth (MAALOX/MYLANTA) 200-200-20 MG/5ML suspension 30 mL  30 mL Oral Q4H PRN Massengill, Nathan, MD       benztropine (COGENTIN) tablet 0.5 mg  0.5 mg Oral BID Abbott Pao, Odessa Morren, MD   0.5 mg at 10/01/21 0810   benztropine (COGENTIN) tablet 1 mg  1 mg Oral BID PRN Comer Locket, MD   1 mg at 09/14/21 1254   benztropine mesylate (COGENTIN) injection 1 mg  1 mg Intramuscular BID PRN Comer Locket, MD       docusate sodium (COLACE) capsule 100 mg  100 mg Oral Daily Mason Jim, Amy E, MD   100 mg at 10/01/21 0810   feeding supplement (KATE FARMS STANDARD 1.4) liquid 325 mL  325 mL Oral BID BM Massengill, Nathan, MD   325 mL at 09/29/21 1411   fluticasone (FLONASE) 50 MCG/ACT nasal spray 1 spray  1 spray Each Nare Daily Massengill, Harrold Donath, MD   1 spray at 10/01/21 0810   haloperidol (HALDOL) tablet 10 mg  10 mg Oral BID Abbott Pao, Abrish Erny, MD   10 mg at 10/01/21 0810   hydrOXYzine (ATARAX) tablet 25 mg  25 mg Oral TID PRN Phineas Inches, MD   25 mg at 09/10/21 1204   LORazepam (ATIVAN) tablet 1 mg  1 mg Oral Q6H PRN Mason Jim, Amy E, MD       magnesium hydroxide (MILK OF MAGNESIA) suspension 30 mL  30 mL Oral Daily PRN Massengill, Nathan, MD       nicotine polacrilex (NICORETTE) gum 2 mg  2 mg Oral PRN Abbott Pao, Alaira Level, MD   2 mg at 09/22/21 0821   OLANZapine zydis (ZYPREXA) disintegrating tablet 5 mg  5 mg Oral Q8H PRN Mason Jim, Amy E, MD   5 mg at 09/14/21 1255   And   ziprasidone (GEODON) injection 20 mg  20 mg Intramuscular PRN Comer Locket, MD       paliperidone (INVEGA) 24 hr tablet 3 mg  3 mg Oral Daily Nwoko, Agnes I, NP   3 mg at 10/01/21 0811   valproic acid (DEPAKENE) 250 MG/5ML solution 1,000 mg  1,000 mg Oral BID Abbott Pao, Shaday Rayborn, MD   1,000 mg at 10/01/21 0810   Lab Results:  No results found for this or any  previous visit (from the past 48 hour(s)).  Blood Alcohol level:  Lab Results  Component Value Date   ETH <10 09/05/2021   Metabolic Disorder Labs: Lab Results  Component Value Date   HGBA1C 5.3 09/08/2021   MPG 105.41 09/08/2021   No results found for: "PROLACTIN" Lab Results  Component Value Date   CHOL 173 09/08/2021   TRIG 110 09/08/2021   HDL 50 09/08/2021   CHOLHDL 3.5 09/08/2021   VLDL 22 09/08/2021   LDLCALC 101 (H) 09/08/2021   LDLCALC 102 (H) 06/21/2020   Physical Findings:  Musculoskeletal: Strength & Muscle Tone: within normal limits Gait & Station: normal Patient leans: N/A  Psychiatric Specialty Exam: Psychiatric Specialty Exam: Physical Exam Vitals and nursing  note reviewed.  Constitutional:      Appearance: Normal appearance.  HENT:     Head: Normocephalic.     Nose: Nose normal.     Mouth/Throat:     Pharynx: Oropharynx is clear.  Cardiovascular:     Rate and Rhythm: Normal rate.     Pulses: Normal pulses.  Pulmonary:     Effort: Pulmonary effort is normal.  Genitourinary:    Comments: Deferred Musculoskeletal:        General: Normal range of motion.     Cervical back: Normal range of motion.  Skin:    General: Skin is warm.     Comments: No rash noted to face  Neurological:     General: No focal deficit present.     Mental Status: [name] alert.     Review of Systems  Constitutional:  Negative for fever.  Respiratory:  Negative for shortness of breath.   Cardiovascular:  Negative for chest pain.  Gastrointestinal:  Negative for diarrhea, nausea and vomiting.  Skin:        Perceives is acne on face  Neurological:  Negative for headaches.  Psychiatric/Behavioral:  Negative for depression and substance abuse.     Blood pressure 111/78, pulse (!) 101, temperature 98.7 F (37.1 C), temperature source Oral, resp. rate 18, height 5\' 6"  (1.676 m), weight 72.6 kg, SpO2 100 %.Body mass index is 25.83 kg/m.  General Appearance: Casual and  Fairly Groomed  Eye Contact:  Good  Speech:  Clear and Coherent, disorganized.  Volume: Within normal limits  Mood: Euthymic, not depressed or euphoric  Affect:  Appropriate and Congruent  Thought Process:  Disorganized and Descriptions of Associations: Tangential  Orientation:  Full (Time, Place, and Person)  Thought Content: Disorganized at times but no paranoia or other delusions noted  Suicidal Thoughts:  No  Homicidal Thoughts:  No  Memory:  Immediate;   Fair Recent;   Fair Remote;   Poor  Judgement:  Impaired  Insight:  Fair  Psychomotor Activity:  Normal  Concentration:  Concentration: Fair and Attention Span: Fair  Recall:  [employer]  Fund of Knowledge:  Poor  Language:  Good  Akathisia:  Negative  Handed:  Right  AIMS (if indicated):     Assets:  Communication Skills Desire for Improvement Physical Health  ADL's:  Intact  Cognition:  Impaired,  Mild  Sleep:  Number of Hours: 6.5    Physical Exam Vitals and nursing note reviewed.  Constitutional:      Appearance: Normal appearance.  HENT:     Head: Normocephalic.     Nose: Nose normal.     Mouth/Throat:     Pharynx: Oropharynx is clear.  Cardiovascular:     Rate and Rhythm: Normal rate.     Pulses: Normal pulses.  Pulmonary:     Effort: Pulmonary effort is normal.  Genitourinary:    Comments: Deferred Musculoskeletal:        General: Normal range of motion.     Cervical back: Normal range of motion.  Skin:    General: Skin is warm.     Comments: No rash noted to face  Neurological:     General: No focal deficit present.     Mental Status: [name] alert.    Review of Systems  Constitutional:  Negative for fever.  Respiratory:  Negative for shortness of breath.   Cardiovascular:  Negative for chest pain.  Gastrointestinal:  Negative for diarrhea, nausea and vomiting.  Skin:  Perceives is acne on face  Neurological:  Negative for headaches.  Psychiatric/Behavioral:  Negative for depression and  substance abuse.    Blood pressure 111/78, pulse (!) 101, temperature 98.7 F (37.1 C), temperature source Oral, resp. rate 18, height 5\' 6"  (1.676 m), weight 72.6 kg, SpO2 100 %. Body mass index is 25.83 kg/m.  Treatment Plan Summary: Diagnoses / Active Problems: Bipolar I MRE manic severe with psychotic features  PLAN: Safety and Monitoring:  -- Converted to IVC admission  -- Daily contact with patient to assess and evaluate symptoms and progress in treatment  -- Patient's case to be discussed in multi-disciplinary team meeting  -- Observation Level : q15 minute checks  -- Vital signs:  q12 hours  -- Precautions: suicide, elopement, and assault  -- Attempting off unit privileges and monitoring behavior  2. Psychiatric Diagnoses and Treatment:   Bipolar I MRE manic with psychotic features  -- Zyprexa was discontinued secondary to lack of efficacy  -Continue Haldol 10 mg BID for psychosis and mood stabilization, monitor  for SE.              -DC oral Invega  -Continue Cogentin 0.5 twice daily for EPS prophylaxis and continue to monitor.             -Continue Invega Sustenna 234 mg IM given on 09-30-21.  Lipid Panel: WNL except for LDL 101; HbgA1c: 5.3; QTc:477ms   -- Continue Depakene 1000 mg twice daily.  Depakote level on 6/7 therapeutic at 75.   Will need more meaningful discussion of psychoeducation about med and need for birth control as she clears CBC, LFT on 6/3 within normal limits   -- Ativan was discontinued few days ago to limit sedative agents.  -- Zyprexa/Geodon/ativan agitation protocol  -- Encouraged patient to participate in unit milieu and in scheduled group therapies     3. Medical Issues Being Addressed:   Mildly elevated creatinine   -- repeat CMP shows creatinine 1.08 and po hydration encouraged   Constipation  - continue colace 100mg  daily and encouraged PRN MOM   Season allergies  - continue Claritin 10mg  daily PRN  4. Discharge Planning:   --  Social work and case management to assist with discharge planning and identification of hospital follow-up needs prior to discharge  -- Discharge Concerns: Need to establish a safety plan; Medication compliance and effectiveness  -- Discharge Goals: Return home with outpatient referrals for mental health follow-up including medication management/psychotherapy Patient is homeless with limited insurance coverage, we will plan discharging to homeless shelters with ACT team resources and follow-up. Referrals for ACT team have been placed by social work. Also recommending applying for disability and Medicaid to facilitate long-term placement which will be needed to avoid further decompensation and rehospitalization.  8/3, MD Patient ID: DEEKSHA COTRELL, female   DOB: [birth year], 56 y.o.   MRN: Nobie Putnam Patient ID: RIVER MCKERCHER, female   DOB: 12/06/1965, 56 y.o.   MRN: Nobie Putnam Patient ID: CELLA CAPPELLO, female   DOB: 08-08-1965, 56 y.o.    MRN: Nobie Putnam Patient ID: DERRIONA BRANSCOM, female   DOB: [birth year], 56 y.o.   MRN: Nobie Putnam

## 2021-10-01 NOTE — Progress Notes (Signed)
Adult Psychoeducational Group Note  Date:  10/01/2021 Time:  10:43 PM  Group Topic/Focus:  Wrap-Up Group:   The focus of this group is to help patients review their daily goal of treatment and discuss progress on daily workbooks.  Participation Level:  Did Not Attend  Participation Quality:   Did Not Attend  Affect:   Did Not Attend  Cognitive:   Did Not Attend  Insight: None  Engagement in Group:   Did Not Attend  Modes of Intervention:   Did Not Attend  Additional Comments:  Pt was encouraged to attend wrap up group but did not attend.  Felipa Furnace 10/01/2021, 10:43 PM

## 2021-10-01 NOTE — Group Note (Signed)
Recreation Therapy Group Note   Group Topic:Personal Development  Group Date: 10/01/2021 Start Time: 1000 End Time: 1025 Facilitators: Caroll Rancher, LRT,CTRS Location: [address] Dayroom   Goal Area(s) Addresses:  Patient will define peace. Patient will identify the benefits of peace. Patient will identify ways peace in used in day to day situations.   Group Description: Finding Peace.  LRT discussed with patients the meaning of peace and what brings them peace.  LRT and patients discussed how peace can be anywhere you make it and is used to clear your mind of any worries, concerns or any distractions all together.  Patients were given a worksheet the broke down peace into 5 questions.  The first question addressed positive change, the second addressed positive relationships, the third addressed future goals, the fourth question was about being grateful for non-material things and the last question addressed setting limits.    Affect/Mood: Flat   Participation Level: Moderate   Participation Quality: Independent   Behavior: Cooperative   Speech/Thought Process: Barely audible  and Disorganized   Insight: Poor   Judgement: Poor   Modes of Intervention: Worksheet   Patient Response to Interventions:  Receptive   Education Outcome:  Acknowledges education and In group clarification offered    Clinical Observations/Individualized Feedback: Pt was quiet and spent a bit of time going through her folders to answer questions on the sheet.  Pt was hard to understand at times and at others times, pt didn't seem to know how to answer the questions.  Pt identified a change she was looking for in relationships was "working to get a mortgage".  Pt expressed looking for marriage and family in positive relationships.  Pt explained her goal was to feel valued and that she was grateful for "a lot" of things.  Pt showed some confusion and inability to follow along with the discussion.  Pt was  however, pleasant during group session.    Plan: Continue to engage patient in RT group sessions 2-3x/week.   Caroll Rancher, LRT,CTRS 10/01/2021 12:04 PM

## 2021-10-01 NOTE — Progress Notes (Signed)
   10/01/21 2130  Psych Admission Type (Psych Patients Only)  Admission Status Involuntary  Psychosocial Assessment  Patient Complaints Worrying  Eye Contact Fair  Facial Expression Anxious  Affect Appropriate to circumstance  Speech Soft  Interaction Minimal  Motor Activity Slow  Appearance/Hygiene Disheveled  Behavior Characteristics Cooperative  Mood Preoccupied  Aggressive Behavior  Effect No apparent injury  Thought Process  Coherency Circumstantial  Content Preoccupation  Delusions None reported or observed  Perception WDL  Hallucination None reported or observed  Judgment Impaired  Confusion Mild  Danger to Self  Current suicidal ideation? Denies  Danger to Others  Danger to Others None reported or observed

## 2021-10-01 NOTE — BH IP Treatment Plan (Signed)
Interdisciplinary Treatment and Diagnostic Plan Update  10/01/2021 Time of Session: 0830 Natalie Brown MRN: [id number]  Principal Diagnosis: Bipolar 1 disorder with moderate mania ([place])  Secondary Diagnoses: Active Problems:   Bipolar affective disorder, current episode manic with psychotic symptoms (HCC)   Current Medications:  Current Facility-Administered Medications  Medication Dose Route Frequency Provider Last Rate Last Admin   alum & mag hydroxide-simeth (MAALOX/MYLANTA) 200-200-20 MG/5ML suspension 30 mL  30 mL Oral Q4H PRN Massengill, Nathan, MD       benztropine (COGENTIN) tablet 0.5 mg  0.5 mg Oral BID Winfred Leeds, Nadir, MD   0.5 mg at 10/01/21 0810   benztropine (COGENTIN) tablet 1 mg  1 mg Oral BID PRN Harlow Asa, MD   1 mg at 09/14/21 1254   benztropine mesylate (COGENTIN) injection 1 mg  1 mg Intramuscular BID PRN Harlow Asa, MD       docusate sodium (COLACE) capsule 100 mg  100 mg Oral Daily Nelda Marseille, Amy E, MD   100 mg at 10/01/21 0810   feeding supplement (KATE FARMS STANDARD 1.4) liquid 325 mL  325 mL Oral BID BM Massengill, [name], MD   325 mL at 10/01/21 1408   fluticasone (FLONASE) 50 MCG/ACT nasal spray 1 spray  1 spray Each Nare Daily Massengill, [name], MD   1 spray at 10/01/21 0810   haloperidol (HALDOL) tablet 10 mg  10 mg Oral BID Winfred Leeds, Nadir, MD   10 mg at 10/01/21 0810   hydrOXYzine (ATARAX) tablet 25 mg  25 mg Oral TID PRN Janine Limbo, MD   25 mg at 09/10/21 1204   LORazepam (ATIVAN) tablet 1 mg  1 mg Oral Q6H PRN Nelda Marseille, Amy E, MD       magnesium hydroxide (MILK OF MAGNESIA) suspension 30 mL  30 mL Oral Daily PRN Massengill, Nathan, MD       nicotine polacrilex (NICORETTE) gum 2 mg  2 mg Oral PRN Winfred Leeds, Nadir, MD   2 mg at 09/22/21 0821   OLANZapine zydis (ZYPREXA) disintegrating tablet 5 mg  5 mg Oral Q8H PRN Viann Fish E, MD   5 mg at 09/14/21 1255   And   ziprasidone (GEODON) injection 20 mg  20 mg Intramuscular PRN  Harlow Asa, MD       valproic acid (DEPAKENE) 250 MG/5ML solution 1,000 mg  1,000 mg Oral BID Winfred Leeds, Nadir, MD   1,000 mg at 10/01/21 1743   PTA Medications: Medications Prior to Admission  Medication Sig Dispense Refill Last Dose   CLARITIN 10 MG tablet Take 10 mg by mouth daily.      fluticasone (FLONASE) 50 MCG/ACT nasal spray Place 1 spray into both nostrils daily.      pseudoephedrine-guaifenesin (MUCINEX D) 60-600 MG 12 hr tablet Take 1 tablet by mouth every 12 (twelve) hours. 30 tablet 0     Patient Stressors: Health problems   Medication change or noncompliance    Patient Strengths: Capable of independent living  Hydrographic surveyor for treatment/growth  Supportive family/friends   Treatment Modalities: Medication Management, Group therapy, Case management,  1 to 1 session with clinician, Psychoeducation, Recreational therapy.   Physician Treatment Plan for Primary Diagnosis: Bipolar 1 disorder with moderate mania ([place]) Long Term Goal(s): Improvement in symptoms so as ready for discharge   Short Term Goals: Ability to identify and develop effective coping behaviors will improve Ability to maintain clinical measurements within normal limits will improve Compliance with prescribed medications will improve Ability  to identify changes in lifestyle to reduce recurrence of condition will improve Ability to verbalize feelings will improve Ability to disclose and discuss suicidal ideas Ability to demonstrate self-control will improve  Medication Management: Evaluate patient's response, side effects, and tolerance of medication regimen.  Therapeutic Interventions: 1 to 1 sessions, Unit Group sessions and Medication administration.  Evaluation of Outcomes: Not Met  Physician Treatment Plan for Secondary Diagnosis: Active Problems:   Bipolar affective disorder, current episode manic with psychotic symptoms ([place])  Long Term Goal(s): Improvement in symptoms  so as ready for discharge   Short Term Goals: Ability to identify and develop effective coping behaviors will improve Ability to maintain clinical measurements within normal limits will improve Compliance with prescribed medications will improve Ability to identify changes in lifestyle to reduce recurrence of condition will improve Ability to verbalize feelings will improve Ability to disclose and discuss suicidal ideas Ability to demonstrate self-control will improve     Medication Management: Evaluate patient's response, side effects, and tolerance of medication regimen.  Therapeutic Interventions: 1 to 1 sessions, Unit Group sessions and Medication administration.  Evaluation of Outcomes: Not Met   RN Treatment Plan for Primary Diagnosis: Bipolar 1 disorder with moderate mania ([place]) Long Term Goal(s): Knowledge of disease and therapeutic regimen to maintain health will improve  Short Term Goals: Ability to remain free from injury will improve, Ability to verbalize frustration and anger appropriately will improve, Ability to demonstrate self-control, Ability to participate in decision making will improve, Ability to verbalize feelings will improve, Ability to disclose and discuss suicidal ideas, Ability to identify and develop effective coping behaviors will improve, and Compliance with prescribed medications will improve  Medication Management: RN will administer medications as ordered by provider, will assess and evaluate patient's response and provide education to patient for prescribed medication. RN will report any adverse and/or side effects to prescribing provider.  Therapeutic Interventions: 1 on 1 counseling sessions, Psychoeducation, Medication administration, Evaluate responses to treatment, Monitor vital signs and CBGs as ordered, Perform/monitor CIWA, COWS, AIMS and Fall Risk screenings as ordered, Perform wound care treatments as ordered.  Evaluation of Outcomes: Not  Met   LCSW Treatment Plan for Primary Diagnosis: Bipolar 1 disorder with moderate mania ([place]) Long Term Goal(s): Safe transition to appropriate next level of care at discharge, Engage patient in therapeutic group addressing interpersonal concerns.  Short Term Goals: Engage patient in aftercare planning with referrals and resources, Increase social support, Increase ability to appropriately verbalize feelings, Increase emotional regulation, Facilitate acceptance of mental health diagnosis and concerns, Facilitate patient progression through stages of change regarding substance use diagnoses and concerns, Identify triggers associated with mental health/substance abuse issues, and Increase skills for wellness and recovery  Therapeutic Interventions: Assess for all discharge needs, 1 to 1 time with Social worker, Explore available resources and support systems, Assess for adequacy in community support network, Educate family and significant other(s) on suicide prevention, Complete Psychosocial Assessment, Interpersonal group therapy.  Evaluation of Outcomes: Not Met   Progress in Treatment: Attending groups: Yes. Participating in groups: No. Taking medication as prescribed: Yes. Toleration medication: Yes. Family/Significant other contact made: Yes, individual(s) contacted:  SPE completed with Sheppard Penton, mother   Patient understands diagnosis: No. Discussing patient identified problems/goals with staff: Yes. Medical problems stabilized or resolved: Yes. Denies suicidal/homicidal ideation: Yes. Issues/concerns per patient self-inventory: Yes. Other: None  New problem(s) identified: No, Describe:  none, Patient continues to lack insight and judgment. Mother is considering filing for guardianship. CSW to  continue to work with mother to provide assistance where needed.   New Short Term/Long Term Goal(s): Patient to work towards detox, elimination of symptoms of psychosis, medication management  for mood stabilization; development of comprehensive mental wellness/sobriety plan.  Patient Goals:  No additional goals identified at this time. Patient to continue to work towards original goals identified in initial treatment team meeting. CSW will remain available to patient should they voice additional treatment goals.   Discharge Plan or Barriers: Patient is homeless, CSW to have further conversations with patient in order to assist with housing.   Reason for Continuation of Hospitalization: Other; describe Confusion, disorganized thought behaviors.   Estimated Length of Stay: 1-7  days   Last 3 [place] Suicide Severity Risk Score: Flowsheet Row Admission (Current) from 09/06/2021 in [facility] ED from 09/05/2021 in [facility] from 05/26/2020 in [facility] High Risk High Risk No Risk       Last PHQ 2/9 Scores:    05/26/2020    2:14 PM 09/03/2017    4:34 PM 08/07/2016    2:12 PM  Depression screen PHQ 2/9  Decreased Interest _0 Down, Depressed, Hopeless _1 PHQ - 2 Score _2 Altered sleeping 0 3 3  Tired, decreased energy 0 3 0  Change in appetite 3 3 0  Feeling bad or failure about yourself  0 3 1  Trouble concentrating 3 0 0  Moving slowly or fidgety/restless 3 0 1  Suicidal thoughts 0 0 0  PHQ-9 Score _3 Scribe for Treatment Team: Larose Kells 10/01/2021 7:41 PM

## 2021-10-01 NOTE — Group Note (Signed)
Type of Therapy and Topic: Group Therapy: Control  Participation Level: Minimal  Description of Group: In this group patients will discuss what is out of their control, what is somewhat in their control, and what is within their control.  They will be encouraged to explore what issues they can control and what issues are out of their control within their daily lives. They will be guided to discuss their thoughts, feelings, and behaviors related to these issues. The group will process together ways to better control things that are well within our own control and how to notice and accept the things that are not within our control. This group will be process-oriented, with patients participating in exploration of their own experiences as well as giving and receiving support and challenge from other group members.  During this group 2 worksheets will be provided to each patient to follow along and fill out.   Therapeutic Goals: 1. Patient will identify what is within their control and what is not within their control. 2. Patient will identify their thoughts and feelings about having control over their own lives. 3. Patient will identify their thoughts and feelings about not having control over everything in their lives.. 4. Patient will identify ways that they can have more control over their own lives. 5. Patient will identify areas were they can allow others to help them or provide assistance.  Summary of Patient Progress: Patient participated in group.  She was disorganized at times but tried to participate in ongoing discussion about control vs. No control.    Lashelle Koy, LCSW, [place] Social Worker  [facility]

## 2021-10-01 NOTE — [facility] Group Notes (Signed)
Adult Psychoeducational Group Note  Date:  10/01/2021 Time:  11:21 AM  Group Topic/Focus:  Goals Group:   The focus of this group is to help patients establish daily goals to achieve during treatment and discuss how the patient can incorporate goal setting into their daily lives to aide in recovery.  Participation Level:  Minimal  Participation Quality:  Appropriate  Affect:  Appropriate  Cognitive:  Appropriate  Insight: Appropriate  Engagement in Group:  Engaged  Modes of Intervention:  Discussion  Additional Comments:  Patient attended goals group. Patient's goal was to get a discharge plan.   Walburga Hudman T Bren Steers 10/01/2021, 11:21 AM

## 2021-10-01 NOTE — [facility] Group Notes (Signed)
Adult Psychoeducational Group Note  Date:  10/01/2021 Time:  5:39 PM  Group Topic/Focus:  Wellness Toolbox:   The focus of this group is to discuss various aspects of wellness, balancing those aspects and exploring ways to increase the ability to experience wellness.  Patients will create a wellness toolbox for use upon discharge.  Participation Level:  Active  Participation Quality:  Appropriate  Affect:  Appropriate  Cognitive:  Appropriate  Insight: Appropriate  Engagement in Group:  Engaged  Modes of Intervention:  Activity  Additional Comments:  Patient attended and participated in the relaxation group activity.  [name] 10/01/2021, 5:39 PM

## 2021-10-01 NOTE — Progress Notes (Addendum)
[facility] LCSW Progress Note   10/01/2021    2:30 PM  Natalie Brown   [id number]   Type of Contact and Topic:  Care Coordination   CSW attempted to reach DSS to discuss potential for guardianship. Unable to reach [name] (Supervisor: [name], [phone]). Left HIPAA compliant voicemail with contact information and callback request. Situation ongoing, CSW will continue to monitor and update note as more information becomes available.   CSW attempted to contact Adah Perl, DAP Director, ext [phone], w/ [employer]. Left HIPAA compliant voicemail with contact information and callback request. Situation ongoing, CSW will continue to monitor and update note as more information becomes available.     Signed:  [name], MSW, LCSWA, LCAS 10/01/2021 2:30 PM

## 2021-10-02 DIAGNOSIS — F3112 Bipolar disorder, current episode manic without psychotic features, moderate: Secondary | ICD-10-CM | POA: Diagnosis not present

## 2021-10-02 MED ORDER — BENZTROPINE MESYLATE 1 MG PO TABS
1.0000 mg | ORAL_TABLET | Freq: Two times a day (BID) | ORAL | Status: DC
  Administered 2021-10-02 – 2021-10-09 (×13): 1 mg via ORAL
  Filled 2021-10-02 (×16): qty 1

## 2021-10-02 NOTE — Progress Notes (Addendum)
[facility]  10/02/2021 12:38 PM Natalie Brown  MRN:  [id number]  Subjective: Natalie Brown states "I'm okay, just stressed because I'm still here"..  Reason for Admission:  Natalie Putnamngela M Laprise is a 56 y.o. female with a history of bipolar d/o, who was initially admitted for inpatient psychiatric hospitalization on 09/06/2021 for management of delusions and disorganized thinking.   Today's patient assessment Note: Chart review indicates no as needed medication given for agitation or aggression.  Patient is compliant with medications and attending groups on the unit, no side effects reported.  Upon evaluation this morning patient reports good sleep and appetite continues to deny SI HI or AVH does not appear responding to stimuli, does not present depressed or manic denies racing thoughts or flight of ideas.  Linear yet concrete.  No sign of EPS or side effect of medications noted except for reported which is probable side effect of being on 2 antipsychotics, she is also on Cogentin at a small dose 0.5 g twice daily. Case was discussed in treatment team, CSW contacted DSS to discuss need for guardianship which will show the patient's mother was approached about it as noted in the note yesterday.   Principal Problem: Bipolar 1 disorder with moderate mania (HCC)  Diagnosis: Active Problems:   Bipolar affective disorder, current episode manic with psychotic symptoms (HCC)  Past Psychiatric History: see H&P  Past Medical History:  Past Medical History:  Diagnosis Date   Bipolar 1 disorder with moderate mania (HCC) 12/30/2013   Hypertension    Family History: see H&P  Family Psychiatric  History: see H&P  Social History:  Social History   Substance and Sexual Activity  Alcohol Use No     Social History   Substance and Sexual Activity  Drug Use No    Social History   Socioeconomic History   Marital status: Married    Spouse name: Not on file   Number of children: Not on file    Years of education: Not on file   Highest education level: Not on file  Occupational History   Not on file  Tobacco Use   Smoking status: Every Day   Smokeless tobacco: Current  Substance and Sexual Activity   Alcohol use: No   Drug use: No   Sexual activity: Not on file  Other Topics Concern   Not on file  Social History Narrative   Not on file   Social Determinants of Health   Financial Resource Strain: Not on file  Food Insecurity: Not on file  Transportation Needs: Not on file  Physical Activity: Not on file  Stress: Not on file  Social Connections: Not on file   Sleep: Good  Appetite:  Good  Current Medications: Current Facility-Administered Medications  Medication Dose Route Frequency Provider Last Rate Last Admin   alum & mag hydroxide-simeth (MAALOX/MYLANTA) 200-200-20 MG/5ML suspension 30 mL  30 mL Oral Q4H PRN Massengill, Nathan, MD       benztropine (COGENTIN) tablet 0.5 mg  0.5 mg Oral BID Abbott PaoAttiah, Jahseh Lucchese, MD   0.5 mg at 10/02/21 0939   benztropine (COGENTIN) tablet 1 mg  1 mg Oral BID PRN Comer LocketSingleton, Amy E, MD   1 mg at 09/14/21 1254   benztropine mesylate (COGENTIN) injection 1 mg  1 mg Intramuscular BID PRN Comer LocketSingleton, Amy E, MD       docusate sodium (COLACE) capsule 100 mg  100 mg Oral Daily Mason JimSingleton, Amy E, MD   100 mg at 10/02/21  8921   feeding supplement (KATE FARMS STANDARD 1.4) liquid 325 mL  325 mL Oral BID BM Massengill, Nathan, MD   325 mL at 10/02/21 0935   fluticasone (FLONASE) 50 MCG/ACT nasal spray 1 spray  1 spray Each Nare Daily Massengill, Harrold Donath, MD   1 spray at 10/02/21 0935   haloperidol (HALDOL) tablet 10 mg  10 mg Oral BID Abbott Pao, Maricel Swartzendruber, MD   10 mg at 10/02/21 0936   hydrOXYzine (ATARAX) tablet 25 mg  25 mg Oral TID PRN Phineas Inches, MD   25 mg at 09/10/21 1204   LORazepam (ATIVAN) tablet 1 mg  1 mg Oral Q6H PRN Comer Locket, MD       magnesium hydroxide (MILK OF MAGNESIA) suspension 30 mL  30 mL Oral Daily PRN Massengill, Harrold Donath,  MD       nicotine polacrilex (NICORETTE) gum 2 mg  2 mg Oral PRN Abbott Pao, Zarria Towell, MD   2 mg at 09/22/21 0821   OLANZapine zydis (ZYPREXA) disintegrating tablet 5 mg  5 mg Oral Q8H PRN Bartholomew Crews E, MD   5 mg at 09/14/21 1255   And   ziprasidone (GEODON) injection 20 mg  20 mg Intramuscular PRN Comer Locket, MD       valproic acid (DEPAKENE) 250 MG/5ML solution 1,000 mg  1,000 mg Oral BID Abbott Pao, Kaelyn Innocent, MD   1,000 mg at 10/02/21 0935   Lab Results:  No results found for this or any previous visit (from the past 48 hour(s)).  Blood Alcohol level:  Lab Results  Component Value Date   ETH <10 09/05/2021   Metabolic Disorder Labs: Lab Results  Component Value Date   HGBA1C 5.3 09/08/2021   MPG 105.41 09/08/2021   No results found for: "PROLACTIN" Lab Results  Component Value Date   CHOL 173 09/08/2021   TRIG 110 09/08/2021   HDL 50 09/08/2021   CHOLHDL 3.5 09/08/2021   VLDL 22 09/08/2021   LDLCALC 101 (H) 09/08/2021   LDLCALC 102 (H) 06/21/2020   Physical Findings:  Musculoskeletal: Strength & Muscle Tone: within normal limits Gait & Station: normal Patient leans: N/A  Psychiatric Specialty Exam: Psychiatric Specialty Exam: Physical Exam Vitals and nursing note reviewed.  Constitutional:      Appearance: Normal appearance.  HENT:     Head: Normocephalic.     Nose: Nose normal.     Mouth/Throat:     Pharynx: Oropharynx is clear.  Cardiovascular:     Rate and Rhythm: Normal rate.     Pulses: Normal pulses.  Pulmonary:     Effort: Pulmonary effort is normal.  Genitourinary:    Comments: Deferred Musculoskeletal:        General: Normal range of motion.     Cervical back: Normal range of motion.  Skin:    General: Skin is warm.     Comments: No rash noted to face  Neurological:     General: No focal deficit present.     Mental Status: [name] alert.     Review of Systems  Constitutional:  Negative for fever.  Respiratory:  Negative for shortness of  breath.   Cardiovascular:  Negative for chest pain.  Gastrointestinal:  Negative for diarrhea, nausea and vomiting.  Skin:        Perceives is acne on face  Neurological:  Negative for headaches.  Psychiatric/Behavioral:  Negative for depression and substance abuse.     Blood pressure 111/78, pulse (!) 101, temperature 98.7 F (37.1 C),  temperature source Oral, resp. rate 18, height  (1.676 m), weight 72.6 kg, SpO2 100 %.Body mass index is 25.83 kg/m.  General Appearance: Casual and Fairly Groomed  Eye Contact:  Good  Speech:  Clear and Coherent, disorganized.  Volume: Within normal limits  Mood: Euthymic, not depressed or euphoric  Affect:  Appropriate and Congruent  Thought Process:  Disorganized and Descriptions of Associations: Tangential  Orientation:  Full (Time, Place, and Person)  Thought Content: Disorganized at times but no paranoia or other delusions noted  Suicidal Thoughts:  No  Homicidal Thoughts:  No  Memory:  Immediate;   Fair Recent;   Fair Remote;   Poor  Judgement:  Impaired  Insight:  Fair  Psychomotor Activity:  Normal  Concentration:  Concentration: Fair and Attention Span: Fair  Recall:  [employer] of Knowledge:  Poor  Language:  Good  Akathisia:  Negative  Handed:  Right  AIMS (if indicated):     Assets:  Communication Skills Desire for Improvement Physical Health  ADL's:  Intact  Cognition:  Impaired,  Mild  Sleep:  Number of Hours: 6    Physical Exam Vitals and nursing note reviewed.  Constitutional:      Appearance: Normal appearance.  HENT:     Head: Normocephalic.     Nose: Nose normal.     Mouth/Throat:     Pharynx: Oropharynx is clear.  Cardiovascular:     Rate and Rhythm: Normal rate.     Pulses: Normal pulses.  Pulmonary:     Effort: Pulmonary effort is normal.  Genitourinary:    Comments: Deferred Musculoskeletal:        General: Normal range of motion.     Cervical back: Normal range of motion.  Skin:    General:  Skin is warm.     Comments: No rash noted to face  Neurological:     General: No focal deficit present.     Mental Status: [name] alert.    Review of Systems  Constitutional:  Negative for fever.  Respiratory:  Negative for shortness of breath.   Cardiovascular:  Negative for chest pain.  Gastrointestinal:  Negative for diarrhea, nausea and vomiting.  Skin:        Perceives is acne on face  Neurological:  Negative for headaches.  Psychiatric/Behavioral:  Negative for depression and substance abuse.    Blood pressure 111/78, pulse (!) 101, temperature 98.7 F (37.1 C), temperature source Oral, resp. rate 18, height  (1.676 m), weight 72.6 kg, SpO2 100 %. Body mass index is 25.83 kg/m.  Treatment Plan Summary: Diagnoses / Active Problems: Bipolar I MRE manic severe with psychotic features  During this hospitalization patient made significant improvement regarding her mood and psychosis was no further mania or paranoia noted despite medication treatment she continues to have ongoing confusion as well as poor insight and judgment regarding her mental health needs and need for follow-up after discharge and need to establish safe residence to limit decompensation. Given today's evaluation and the earlier evaluations during this hospital stay, in my opinion patient at baseline lacks capacity to make medical or social decisions regarding placement or finances, she needs a guardian to make decisions on her behalf in her best interest.  PLAN: Safety and Monitoring:  -- Converted to IVC admission  -- Daily contact with patient to assess and evaluate symptoms and progress in treatment  -- Patient's case to be discussed in multi-disciplinary team meeting  -- Observation Level :  q15 minute checks  -- Vital signs:  q12 hours  -- Precautions: suicide, elopement, and assault  -- Attempting off unit privileges and monitoring behavior  2. Psychiatric Diagnoses and Treatment:   Bipolar I MRE  manic with psychotic features  -- Zyprexa was discontinued secondary to lack of efficacy  -Continue Haldol 10 mg BID for psychosis and mood stabilization, monitor  for SE.              -DC oral Invega  -Titrate Cogentin from 0.5 to 1 mg twice daily for EPS prophylaxis and continue to monitor.             -Continue Invega Sustenna 234 mg IM given on 09-30-21.  Lipid Panel: WNL except for LDL 101; HbgA1c: 5.3; QTc:490ms   -- Continue Depakene 1000 mg twice daily.  Depakote level on 6/7 therapeutic at 75.   Will need more meaningful discussion of psychoeducation about med and need for birth control as she clears CBC, LFT on 6/3 within normal limits   -- Ativan was discontinued few days ago to limit sedative agents.  -- Zyprexa/Geodon/ativan agitation protocol  -- Encouraged patient to participate in unit milieu and in scheduled group therapies     3. Medical Issues Being Addressed:   Mildly elevated creatinine   -- repeat CMP shows creatinine 1.08 and po hydration encouraged   Constipation  - continue colace 100mg  daily and encouraged PRN MOM   Season allergies  - continue Claritin 10mg  daily PRN  4. Discharge Planning:   -- Social work and case management to assist with discharge planning and identification of hospital follow-up needs prior to discharge  -- Discharge Concerns: Need to establish a safety plan; Medication compliance and effectiveness  -- Discharge Goals: Return home with outpatient referrals for mental health follow-up including medication management/psychotherapy Patient is homeless with limited insurance coverage, we will plan discharging to homeless shelters with ACT team resources and follow-up. Referrals for ACT team have been placed by social work. Also recommending applying for disability and Medicaid to facilitate long-term placement which will be needed to avoid further decompensation and rehospitalization.  , MD Patient ID: KATALENA MALVEAUX, female    DOB: [birth year], 56 y.o.   MRN: 05/19/1965 Patient ID: VALERIA BOZA, female   DOB: 26-Nov-1965, 56 y.o.   MRN: 05/19/1967 Patient ID: JAHLISA ROSSITTO, female   DOB: July 17, 1965, 55 y.o.    MRN: 05/19/1967 Patient ID: KIANAH HARRIES, female   DOB: [birth year], 56 y.o.   MRN: 05/19/1965

## 2021-10-02 NOTE — Group Note (Signed)
Recreation Therapy Group Note   Group Topic:Health and Wellness  Group Date: 10/02/2021 Start Time: 0955 End Time: 1025 Facilitators: Caroll Rancher, LRT,CTRS Location: [address] Dayroom   Goal Area(s) Addresses:  Patient will define components of whole wellness. Patient will verbalize benefit of whole wellness.  Group Description:  Exercise.  LRT and patients discussed the importance of wellness and the main elements that make it up (mental, physical and spiritual).  LRT then explained to group they would be focusing on the physical aspect with some chair exercises.  LRT led group in a series of stretches before allowing each patient to lead group in an exercise of their choosing.  The group was going for at least 30 minutes of movement.  Patients were encouraged to get water or take breaks if needed.   Affect/Mood: Labile   Participation Level: Moderate   Participation Quality: Independent   Behavior: Cooperative and Distracted   Speech/Thought Process: Rational   Insight: Poor   Judgement: Poor   Modes of Intervention: Music and Exercise   Patient Response to Interventions:  Receptive   Education Outcome:  Acknowledges education and In group clarification offered    Clinical Observations/Individualized Feedback: Pt engaged for the most part.  When asked if she had an exercise for the group to do, pt seemed to have drifted off and would have to be asked a few times before she answered.  Pt had a hard time staying focused but gave some participation.     Plan: Continue to engage patient in RT group sessions 2-3x/week.   Caroll Rancher, Antonietta Jewel 10/02/2021 12:25 PM

## 2021-10-02 NOTE — Progress Notes (Signed)
Adult Psychoeducational Group Note  Date:  10/02/2021 Time:  11:18 PM  Group Topic/Focus:  Wrap-Up Group:   The focus of this group is to help patients review their daily goal of treatment and discuss progress on daily workbooks.  Participation Level:  Did Not Attend  Participation Quality:   Did Not Attend  Affect:   Did Not Attend  Cognitive:   Did Not Attend  Insight: None  Engagement in Group:   Did Not Attend  Modes of Intervention:   Did Not Attend  Additional Comments:  Pt was encouraged to attend wrap up group but did not attend  Felipa Furnace 10/02/2021, 11:18 PM

## 2021-10-02 NOTE — Progress Notes (Signed)
   10/02/21 2315  Psych Admission Type (Psych Patients Only)  Admission Status Involuntary  Psychosocial Assessment  Patient Complaints Worrying  Eye Contact Fair  Facial Expression Anxious  Affect Appropriate to circumstance  Speech Soft  Interaction Minimal  Motor Activity Slow  Appearance/Hygiene Disheveled  Behavior Characteristics Cooperative  Mood Preoccupied  Aggressive Behavior  Effect No apparent injury  Thought Process  Coherency Circumstantial  Content Preoccupation  Delusions None reported or observed  Perception WDL  Hallucination None reported or observed  Judgment Impaired  Confusion Mild  Danger to Self  Current suicidal ideation? Denies  Danger to Others  Danger to Others None reported or observed

## 2021-10-02 NOTE — Progress Notes (Signed)
   10/02/21 0530  Sleep  Number of Hours 6

## 2021-10-02 NOTE — Progress Notes (Signed)
[facility] LCSW Progress Note   10/02/2021    2:41 PM  Natalie Brown   [id number]   Type of Contact and Topic:  Judicial Hearing    Patient was present for IVC judicial hearing, offered opportunity to provide input. Sharee Pimple approved 7 day extension to IVC. Next hearing to be scheduled for next Tuesday should further hospitalization be needed.    Signed:  [name], MSW, LCSWA, LCAS 10/02/2021 2:41 PM

## 2021-10-03 DIAGNOSIS — F3112 Bipolar disorder, current episode manic without psychotic features, moderate: Secondary | ICD-10-CM | POA: Diagnosis not present

## 2021-10-03 NOTE — Progress Notes (Addendum)
[facility] LCSW Progress Note   10/03/2021    11:55 AM  Natalie Brown   [id number]   Type of Contact and Topic:  Care Coordination   CSW has made multiple attempts to reach [name] service line at [facility]. DSS unsuccessfully. Writer then attempted to contact supervisor [name] at [phone] after unsuccessfully attempting to reach [name], DSS Case Worker. Unable to reach supervisor. Left HIPAA compliant voicemail with contact information and callback request.   CSW awaiting call back from supervisor [name]. Should CSW not receive a call back in 24 hours, writer will contact [name] supervisor of [name] at [phone].  Alternatively, patient's mother [name] has been provided with letter from physician stating patient lacks capacity to make decisions in her best interest and is recommended for guardianship. Mother is expected to file for emergent guardianship on this day. CSW to follow up at a later time.   Situation ongoing, CSW will continue to monitor and update note as more information becomes available.   Signed:  [name], MSW, LCSWA, LCAS 10/03/2021 11:55 AM

## 2021-10-03 NOTE — Progress Notes (Signed)
[facility] MD Progress Note  10/03/2021 11:38 AM Natalie Brown  MRN:  [id number]  Subjective: Natalie Brown states "I'm okay, just stressed because I'm still here"..  Reason for Admission:  Natalie Brown is a 56 y.o. female with a history of bipolar d/o, who was initially admitted for inpatient psychiatric hospitalization on 09/06/2021 for management of delusions and disorganized thinking.   Today's patient assessment Note: Chart review indicates no as needed medication given for agitation or aggression.  Patient is compliant with medications and attending groups on the unit, no side effects reported.  Upon evaluation this morning patient continues to report good sleep and appetite, denies SI HI or AVH, presents with pleasant mood and affect and denies depressed mood, denies racing thoughts or symptoms consistent with mania or hypomania.  [name] occasionally confused during evaluation and treatment by record court hearing that was done yesterday for her IVC talking about meeting with discharge and needing to see his discharge today yet easy to redirect.  No sign of EPS noted.  No drooling noted during evaluation, Cogentin was titrated up yesterday to address drooling side effect. Discussed with staff current process to have patient assigned a guardian is a family member or to be induced from state given patient lacks capacity to make decisions regarding her medical care or social disposition planning.  Principal Problem: Bipolar 1 disorder with moderate mania (HCC)  Diagnosis: Active Problems:   Bipolar affective disorder, current episode manic with psychotic symptoms ([place])  Past Psychiatric History: see H&P  Past Medical History:  Past Medical History:  Diagnosis Date   Bipolar 1 disorder with moderate mania ([place]) 12/30/2013   Hypertension    Family History: see H&P  Family Psychiatric  History: see H&P  Social History:  Social History   Substance and Sexual Activity  Alcohol Use No      Social History   Substance and Sexual Activity  Drug Use No    Social History   Socioeconomic History   Marital status: Married    Spouse name: Not on file   Number of children: Not on file   Years of education: Not on file   Highest education level: Not on file  Occupational History   Not on file  Tobacco Use   Smoking status: Every Day   Smokeless tobacco: Current  Substance and Sexual Activity   Alcohol use: No   Drug use: No   Sexual activity: Not on file  Other Topics Concern   Not on file  Social History Narrative   Not on file   Social Determinants of Health   Financial Resource Strain: Not on file  Food Insecurity: Not on file  Transportation Needs: Not on file  Physical Activity: Not on file  Stress: Not on file  Social Connections: Not on file   Sleep: Good  Appetite:  Good  Current Medications: Current Facility-Administered Medications  Medication Dose Route Frequency Provider Last Rate Last Admin   alum & mag hydroxide-simeth (MAALOX/MYLANTA) 200-200-20 MG/5ML suspension 30 mL  30 mL Oral Q4H PRN Massengill, Nathan, MD       benztropine (COGENTIN) tablet 1 mg  1 mg Oral BID PRN Viann Fish E, MD   1 mg at 09/14/21 1254   benztropine (COGENTIN) tablet 1 mg  1 mg Oral BID Winfred Leeds, Gurman Ashland, MD   1 mg at 10/03/21 0936   benztropine mesylate (COGENTIN) injection 1 mg  1 mg Intramuscular BID PRN Harlow Asa, MD  docusate sodium (COLACE) capsule 100 mg  100 mg Oral Daily Nelda Marseille, Amy E, MD   100 mg at 10/03/21 0936   feeding supplement (KATE FARMS STANDARD 1.4) liquid 325 mL  325 mL Oral BID BM Massengill, [name], MD   325 mL at 10/02/21 1446   fluticasone (FLONASE) 50 MCG/ACT nasal spray 1 spray  1 spray Each Nare Daily Massengill, [name], MD   1 spray at 10/03/21 0940   haloperidol (HALDOL) tablet 10 mg  10 mg Oral BID Winfred Leeds, Nashon Erbes, MD   10 mg at 10/03/21 0936   hydrOXYzine (ATARAX) tablet 25 mg  25 mg Oral TID PRN Janine Limbo, MD   25  mg at 09/10/21 1204   LORazepam (ATIVAN) tablet 1 mg  1 mg Oral Q6H PRN Harlow Asa, MD       magnesium hydroxide (MILK OF MAGNESIA) suspension 30 mL  30 mL Oral Daily PRN Massengill, [name], MD       nicotine polacrilex (NICORETTE) gum 2 mg  2 mg Oral PRN Winfred Leeds, Honor Fairbank, MD   2 mg at 09/22/21 0821   OLANZapine zydis (ZYPREXA) disintegrating tablet 5 mg  5 mg Oral Q8H PRN Viann Fish E, MD   5 mg at 09/14/21 1255   And   ziprasidone (GEODON) injection 20 mg  20 mg Intramuscular PRN Harlow Asa, MD       valproic acid (DEPAKENE) 250 MG/5ML solution 1,000 mg  1,000 mg Oral BID Winfred Leeds, Flannery Cavallero, MD   1,000 mg at 10/03/21 [id number]   Lab Results:  No results found for this or any previous visit (from the past 87 hour(s)).  Blood Alcohol level:  Lab Results  Component Value Date   ETH <10 [id number]   Metabolic Disorder Labs: Lab Results  Component Value Date   HGBA1C 5.3 09/08/2021   MPG 105.41 09/08/2021   No results found for: "PROLACTIN" Lab Results  Component Value Date   CHOL 173 09/08/2021   TRIG 110 09/08/2021   HDL 50 09/08/2021   CHOLHDL 3.5 09/08/2021   VLDL 22 09/08/2021   LDLCALC 101 (H) 09/08/2021   LDLCALC 102 (H) 06/21/2020   Physical Findings:  Musculoskeletal: Strength & Muscle Tone: within normal limits Gait & Station: normal Patient leans: N/A  Psychiatric Specialty Exam: Psychiatric Specialty Exam: Physical Exam Vitals and nursing note reviewed.  Constitutional:      Appearance: Normal appearance.  HENT:     Head: Normocephalic.     Nose: Nose normal.     Mouth/Throat:     Pharynx: Oropharynx is clear.  Cardiovascular:     Rate and Rhythm: Normal rate.     Pulses: Normal pulses.  Pulmonary:     Effort: Pulmonary effort is normal.  Genitourinary:    Comments: Deferred Musculoskeletal:        General: Normal range of motion.     Cervical back: Normal range of motion.  Skin:    General: Skin is warm.     Comments: No rash noted to face   Neurological:     General: No focal deficit present.     Mental Status: [name] alert.     Review of Systems  Constitutional: Negative.  Negative for fever.  Respiratory: Negative.  Negative for shortness of breath.   Cardiovascular: Negative.  Negative for chest pain.  Gastrointestinal:  Negative for diarrhea, nausea and vomiting.  Skin:        Perceives is acne on face  Neurological: Negative.  Negative for  headaches.  Psychiatric/Behavioral:  Negative for depression and substance abuse.     Blood pressure 111/78, pulse (!) 101, temperature 98.7 F (37.1 C), temperature source Oral, resp. rate 18, height 5\' 6"  (1.676 m), weight 72.6 kg, SpO2 100 %.Body mass index is 25.83 kg/m.  General Appearance: Casual and Fairly Groomed  Eye Contact:  Good  Speech:  Clear and Coherent, disorganized.  Volume: Within normal limits  Mood: Euthymic, not depressed or euphoric  Affect:  Appropriate and Congruent  Thought Process:  Disorganized and Descriptions of Associations: Tangential  Orientation:  Full (Time, Place, and Person)  Thought Content: Disorganized at times but no paranoia or other delusions noted  Suicidal Thoughts:  No  Homicidal Thoughts:  No  Memory:  Immediate;   Fair Recent;   Fair Remote;   Poor  Judgement:  Impaired  Insight:  Fair  Psychomotor Activity:  Normal  Concentration:  Concentration: Fair and Attention Span: Fair  Recall:  [employer] of Knowledge:  Poor  Language:  Good  Akathisia:  Negative  Handed:  Right  AIMS (if indicated):     Assets:  Communication Skills Desire for Improvement Physical Health  ADL's:  Intact  Cognition:  Impaired,  Mild  Sleep:  Number of Hours: 7.5    Physical Exam Vitals and nursing note reviewed.  Constitutional:      Appearance: Normal appearance.  HENT:     Head: Normocephalic.     Nose: Nose normal.     Mouth/Throat:     Pharynx: Oropharynx is clear.  Cardiovascular:     Rate and Rhythm: Normal rate.      Pulses: Normal pulses.  Pulmonary:     Effort: Pulmonary effort is normal.  Genitourinary:    Comments: Deferred Musculoskeletal:        General: Normal range of motion.     Cervical back: Normal range of motion.  Skin:    General: Skin is warm.     Comments: No rash noted to face  Neurological:     General: No focal deficit present.     Mental Status: [name] alert.    Review of Systems  Constitutional: Negative.  Negative for fever.  Respiratory: Negative.  Negative for shortness of breath.   Cardiovascular: Negative.  Negative for chest pain.  Gastrointestinal:  Negative for diarrhea, nausea and vomiting.  Skin:        Perceives is acne on face  Neurological: Negative.  Negative for headaches.  Psychiatric/Behavioral:  Negative for depression and substance abuse.    Blood pressure 111/78, pulse (!) 101, temperature 98.7 F (37.1 C), temperature source Oral, resp. rate 18, height 5\' 6"  (1.676 m), weight 72.6 kg, SpO2 100 %. Body mass index is 25.83 kg/m.  Treatment Plan Summary: Diagnoses / Active Problems: Bipolar I MRE manic severe with psychotic features  During this hospitalization patient made significant improvement regarding her mood and psychosis was no further mania or paranoia noted despite medication treatment she continues to have ongoing confusion as well as poor insight and judgment regarding her mental health needs and need for follow-up after discharge and need to establish safe residence to limit decompensation. Given today's evaluation and the earlier evaluations during this hospital stay, in my opinion patient at baseline lacks capacity to make medical or social decisions regarding placement or finances, she needs a guardian to make decisions on her behalf in her best interest.  PLAN: Safety and Monitoring:  -- Converted to IVC admission  --  Daily contact with patient to assess and evaluate symptoms and progress in treatment  -- Patient's case to be  discussed in multi-disciplinary team meeting  -- Observation Level : q15 minute checks  -- Vital signs:  q12 hours  -- Precautions: suicide, elopement, and assault  -- Attempting off unit privileges and monitoring behavior  2. Psychiatric Diagnoses and Treatment:   Bipolar I MRE manic with psychotic features  -- Zyprexa was discontinued secondary to lack of efficacy  -Continue Haldol 10 mg BID for psychosis and mood stabilization, monitor  for SE.              -DC oral Invega  -Continue Cogentin 1 mg twice daily for EPS prophylaxis and continue to monitor.             -Continue Invega Sustenna 234 mg IM given on 09-30-21.  Lipid Panel: WNL except for LDL 101; HbgA1c: 5.3; QTc:468ms   -- Continue Depakene 1000 mg twice daily.  Depakote level on 6/7 therapeutic at 75.   Will need more meaningful discussion of psychoeducation about med and need for birth control as she clears CBC, LFT on 6/3 within normal limits   -- Ativan was discontinued few days ago to limit sedative agents.  -- Zyprexa/Geodon/ativan agitation protocol  -- Encouraged patient to participate in unit milieu and in scheduled group therapies     3. Medical Issues Being Addressed:   Mildly elevated creatinine   -- repeat CMP shows creatinine 1.08 and po hydration encouraged   Constipation  - continue colace 100mg  daily and encouraged PRN MOM   Season allergies  - continue Claritin 10mg  daily PRN  4. Discharge Planning:   -- Social work and case management to assist with discharge planning and identification of hospital follow-up needs prior to discharge  -- Discharge Concerns: Need to establish a safety plan; Medication compliance and effectiveness  -- Discharge Goals: Return home with outpatient referrals for mental health follow-up including medication management/psychotherapy Patient is homeless with limited insurance coverage, we will plan discharging to homeless shelters with ACT team resources and follow-up.  Referrals for ACT team have been placed by social work. Also recommending applying for disability and Medicaid to facilitate long-term placement which will be needed to avoid further decompensation and rehospitalization.  Dian Situ, MD Patient ID: Natalie Brown, female   DOB: 13-Jul-1965, 56 y.o.   MRN: [id number] Patient ID: Natalie Brown, female   DOB: 01-Jan-1966, 56 y.o.   MRN: [id number] Patient ID: Natalie Brown, female   DOB: 1965/05/27, 56 y.o.    MRN: [id number] Patient ID: Natalie Brown, female   DOB: July 08, 1965, 56 y.o.   MRN: [id number]

## 2021-10-03 NOTE — Progress Notes (Signed)
   10/03/21 0545  Sleep  Number of Hours 7.5

## 2021-10-03 NOTE — Progress Notes (Signed)
Pt visible in milieu at intervals this shift. Denies SI, HI, AVh and pain when assessed. Continues to express request for d/c. Still confused, preoccupied about court proceeding related to IVC paper work from [employer] but remains verbally redirectable. Visited with mother this evening. Safety checks maintained at Q 15 minutes intervals without incident. Emotional support, reassurance and encouragement provided to pt. All medications administered with verbal education and effects monitored. Pt tolerates all meals, fluids and medications well. Remains verbally redirectable at this time.

## 2021-10-03 NOTE — Progress Notes (Signed)
   10/03/21 2030  Psych Admission Type (Psych Patients Only)  Admission Status Involuntary  Psychosocial Assessment  Patient Complaints Worrying  Eye Contact Fair  Facial Expression Anxious  Affect Appropriate to circumstance  Speech Soft  Interaction Minimal  Motor Activity Slow  Appearance/Hygiene Disheveled  Behavior Characteristics Cooperative  Mood Suspicious;Preoccupied  Aggressive Behavior  Effect No apparent injury  Thought Process  Coherency Circumstantial  Content Preoccupation  Delusions None reported or observed  Perception WDL  Hallucination None reported or observed  Judgment Impaired  Confusion Mild  Danger to Self  Current suicidal ideation? Denies  Danger to Others  Danger to Others None reported or observed

## 2021-10-03 NOTE — Group Note (Signed)
Recreation Therapy Group Note   Group Topic:Communication  Group Date: 10/03/2021 Start Time: 1000 End Time: 1040 Facilitators: Caroll Rancher, LRT,CTRS Location: [address] Dayroom   Goal Area(s) Addresses:  Patient will effectively listen to complete activity.  Patient will identify communication skills used to make activity successful.  Patient will identify how skills used during activity can be used to reach post d/c goals.    Group Description:  Geometric Drawings.  Three volunteers from the peer group will be shown an abstract picture with a particular arrangement of geometrical shapes.  Each round, one 'speaker' will describe the pattern, as accurately as possible without revealing the image to the group.  The remaining group members will listen and draw the picture to reflect how it is described to them. Patients with the role of 'listener' cannot ask clarifying questions but, may request that the speaker repeat a direction. Once the drawings are complete, the presenter will show the rest of the group the picture and compare how close each person came to drawing the picture. LRT will facilitate a post-activity discussion regarding effective communication and the importance of planning, listening, and asking for clarification in daily interactions with others.   Affect/Mood: Appropriate   Participation Level: Moderate   Participation Quality: Independent   Behavior: Appropriate   Speech/Thought Process: Disorganized   Insight: Lacking   Judgement: Lacking    Modes of Intervention: Activity   Patient Response to Interventions:  Challenging    Education Outcome:  Acknowledges education and In group clarification offered    Clinical Observations/Individualized Feedback: Pt appeared to struggle with the activity.  Pt was unable to follow along with the descriptions of peers.  At one point, pt was observed coloring during the activity.  Pt was appropriate during activity.      Plan: Continue to engage patient in RT group sessions 2-3x/week.   Caroll Rancher, LRT,CTRS 10/03/2021 1:56 PM

## 2021-10-03 NOTE — Group Note (Signed)
LCSW Group Therapy Note   Group Date: 10/03/2021 Start Time: 1300 End Time: 1400   Type of Therapy and Topic:  Group Therapy: Problem Solving   Participation Level:  Did not attend  Description of Group:  Patients identified different skills needed to problem solve.  Patients were able to identify a problem they were having and identify steps into solving that problem including 1. Identify the problem, 2. Generating possible solutions, 3. Evaluating alternatives, 4. Decide on a solution, 5 Implement the solution and 6 Evaluate the outcome.  Patients demonstrated understanding by participating in discussion and assisting peers with solving problems.    Therapeutic Goals:  1. Identify problem solving skills 2. Demonstrate understanding by identifying problem and going through steps of problem solving.     Summary of Patient Progress:   Did not attend  Therapeutic Modalities:   Gaige Sebo E Celicia Minahan, LCSW 10/03/2021  1:48 PM   

## 2021-10-04 DIAGNOSIS — F3112 Bipolar disorder, current episode manic without psychotic features, moderate: Secondary | ICD-10-CM | POA: Diagnosis not present

## 2021-10-04 NOTE — Progress Notes (Signed)
   10/04/21 0530  Sleep  Number of Hours 8.25    

## 2021-10-04 NOTE — [facility] Group Notes (Signed)
Patient did not attend the Wrap-up group. 

## 2021-10-04 NOTE — Progress Notes (Signed)
   10/04/21 2115  Psych Admission Type (Psych Patients Only)  Admission Status Involuntary  Psychosocial Assessment  Patient Complaints Worrying  Eye Contact Fair  Facial Expression Anxious  Affect Appropriate to circumstance  Speech Soft  Interaction Minimal  Motor Activity Slow  Appearance/Hygiene Disheveled  Behavior Characteristics Cooperative  Mood Preoccupied  Aggressive Behavior  Effect No apparent injury  Thought Process  Coherency Circumstantial  Content Preoccupation  Delusions None reported or observed  Perception WDL  Hallucination None reported or observed  Judgment Impaired  Confusion Mild  Danger to Self  Current suicidal ideation? Denies  Danger to Others  Danger to Others None reported or observed

## 2021-10-04 NOTE — Progress Notes (Addendum)
[facility] MD Progress Note  10/04/2021 1:21 PM Natalie Brown  MRN:  [id number]  Subjective: Natalie Brown states "I'm okay, just stressed because I'm still here"..  Reason for Admission:  Natalie Brown is a 56 y.o. female with a history of bipolar d/o, who was initially admitted for inpatient psychiatric hospitalization on 09/06/2021 for management of delusions and disorganized thinking.   Today's patient assessment Note: No as needed medications needed or given for agitation, patient continues to be compliant with medications as written, reportedly attending groups and participates with medications secondary to on and off confusion.  This morning patient reports good sleep and appetite, continues to deny SI HI or AVH does not present paranoid or delusional or responding to stimuli.  She continues to be confused at times, this morning she is preoccupied with needing to talk to the judge as she was told she will leave after 7-day which she is correlating with her IVC paperwork for 7 days.  [name] redirectable that we are currently working on finding her a place to go to.  She denies side effects to medications currently and reports her mood to be stable and denies any depressed mood or anxiety, does not present with mania or hypomania.  During evaluation patient does not display any signs of EPS, no cogwheel rigidity or stiffness, no other side effects noted.  Julli have improved since Cogentin was titrated.  Principal Problem: Bipolar 1 disorder with moderate mania (HCC)  Diagnosis: Active Problems:   Bipolar affective disorder, current episode manic with psychotic symptoms (HCC)  Past Psychiatric History: see H&P  Past Medical History:  Past Medical History:  Diagnosis Date   Bipolar 1 disorder with moderate mania (HCC) 12/30/2013   Hypertension    Family History: see H&P  Family Psychiatric  History: see H&P  Social History:  Social History   Substance and Sexual Activity  Alcohol Use No      Social History   Substance and Sexual Activity  Drug Use No    Social History   Socioeconomic History   Marital status: Married    Spouse name: Not on file   Number of children: Not on file   Years of education: Not on file   Highest education level: Not on file  Occupational History   Not on file  Tobacco Use   Smoking status: Every Day   Smokeless tobacco: Current  Substance and Sexual Activity   Alcohol use: No   Drug use: No   Sexual activity: Not on file  Other Topics Concern   Not on file  Social History Narrative   Not on file   Social Determinants of Health   Financial Resource Strain: Not on file  Food Insecurity: Not on file  Transportation Needs: Not on file  Physical Activity: Not on file  Stress: Not on file  Social Connections: Not on file   Sleep: Good  Appetite:  Good  Current Medications: Current Facility-Administered Medications  Medication Dose Route Frequency Provider Last Rate Last Admin   alum & mag hydroxide-simeth (MAALOX/MYLANTA) 200-200-20 MG/5ML suspension 30 mL  30 mL Oral Q4H PRN Massengill, Nathan, MD       benztropine (COGENTIN) tablet 1 mg  1 mg Oral BID PRN Bartholomew Crews E, MD   1 mg at 09/14/21 1254   benztropine (COGENTIN) tablet 1 mg  1 mg Oral BID Abbott Pao, Shiheem Corporan, MD   1 mg at 10/04/21 0809   benztropine mesylate (COGENTIN) injection 1 mg  1  mg Intramuscular BID PRN Comer Locket, MD       docusate sodium (COLACE) capsule 100 mg  100 mg Oral Daily Mason Jim, Amy E, MD   100 mg at 10/04/21 0809   feeding supplement (KATE FARMS STANDARD 1.4) liquid 325 mL  325 mL Oral BID BM Massengill, Harrold Donath, MD   325 mL at 10/03/21 1402   fluticasone (FLONASE) 50 MCG/ACT nasal spray 1 spray  1 spray Each Nare Daily Massengill, Harrold Donath, MD   1 spray at 10/04/21 0810   haloperidol (HALDOL) tablet 10 mg  10 mg Oral BID Abbott Pao, Derica Leiber, MD   10 mg at 10/04/21 0809   hydrOXYzine (ATARAX) tablet 25 mg  25 mg Oral TID PRN Phineas Inches, MD   25  mg at 09/10/21 1204   LORazepam (ATIVAN) tablet 1 mg  1 mg Oral Q6H PRN Comer Locket, MD       magnesium hydroxide (MILK OF MAGNESIA) suspension 30 mL  30 mL Oral Daily PRN Massengill, Harrold Donath, MD       nicotine polacrilex (NICORETTE) gum 2 mg  2 mg Oral PRN Abbott Pao, Verdean Murin, MD   2 mg at 09/22/21 0821   OLANZapine zydis (ZYPREXA) disintegrating tablet 5 mg  5 mg Oral Q8H PRN Bartholomew Crews E, MD   5 mg at 09/14/21 1255   And   ziprasidone (GEODON) injection 20 mg  20 mg Intramuscular PRN Comer Locket, MD       valproic acid (DEPAKENE) 250 MG/5ML solution 1,000 mg  1,000 mg Oral BID Abbott Pao, Iretta Mangrum, MD   1,000 mg at 10/04/21 0809   Lab Results:  No results found for this or any previous visit (from the past 48 hour(s)).  Blood Alcohol level:  Lab Results  Component Value Date   ETH <10 09/05/2021   Metabolic Disorder Labs: Lab Results  Component Value Date   HGBA1C 5.3 09/08/2021   MPG 105.41 09/08/2021   No results found for: "PROLACTIN" Lab Results  Component Value Date   CHOL 173 09/08/2021   TRIG 110 09/08/2021   HDL 50 09/08/2021   CHOLHDL 3.5 09/08/2021   VLDL 22 09/08/2021   LDLCALC 101 (H) 09/08/2021   LDLCALC 102 (H) 06/21/2020   Physical Findings:  Musculoskeletal: Strength & Muscle Tone: within normal limits Gait & Station: normal Patient leans: N/A  Psychiatric Specialty Exam: Psychiatric Specialty Exam: Physical Exam Vitals and nursing note reviewed.  Constitutional:      Appearance: Normal appearance.  HENT:     Head: Normocephalic.     Nose: Nose normal.     Mouth/Throat:     Pharynx: Oropharynx is clear.  Cardiovascular:     Rate and Rhythm: Normal rate.     Pulses: Normal pulses.  Pulmonary:     Effort: Pulmonary effort is normal.  Genitourinary:    Comments: Deferred Musculoskeletal:        General: Normal range of motion.     Cervical back: Normal range of motion.  Skin:    General: Skin is warm.     Comments: No rash noted to face   Neurological:     General: No focal deficit present.     Mental Status: [name] alert.     Review of Systems  Constitutional: Negative.  Negative for fever.  Respiratory: Negative.  Negative for shortness of breath.   Cardiovascular: Negative.  Negative for chest pain.  Gastrointestinal:  Negative for diarrhea, nausea and vomiting.  Skin:  Perceives is acne on face  Neurological: Negative.  Negative for headaches.  Psychiatric/Behavioral:  Negative for depression and substance abuse.     Blood pressure 110/75, pulse (!) 118, temperature 98.2 F (36.8 C), temperature source Oral, resp. rate 16, height 5\' 6"  (1.676 m), weight 72.6 kg, SpO2 96 %.Body mass index is 25.83 kg/m.  General Appearance: Casual and Fairly Groomed  Eye Contact:  Good  Speech:  Clear and Coherent, disorganized.  Volume: Within normal limits  Mood: Euthymic, not depressed or euphoric  Affect:  Appropriate and Congruent  Thought Process:  Disorganized and Descriptions of Associations: Tangential  Orientation:  Full (Time, Place, and Person)  Thought Content: Disorganized at times but no paranoia or other delusions noted  Suicidal Thoughts:  No  Homicidal Thoughts:  No  Memory:  Immediate;   Fair Recent;   Fair Remote;   Poor  Judgement:  Impaired  Insight:  Fair  Psychomotor Activity:  Normal  Concentration:  Concentration: Fair and Attention Span: Fair  Recall:  of Knowledge:  Poor  Language:  Good  Akathisia:  Negative  Handed:  Right  AIMS (if indicated):     Assets:  Communication Skills Desire for Improvement Physical Health  ADL's:  Intact  Cognition:  Impaired,  Mild  Sleep:  Number of Hours: 8.25    Physical Exam Vitals and nursing note reviewed.  Constitutional:      Appearance: Normal appearance.  HENT:     Head: Normocephalic.     Nose: Nose normal.     Mouth/Throat:     Pharynx: Oropharynx is clear.  Cardiovascular:     Rate and Rhythm: Normal rate.      Pulses: Normal pulses.  Pulmonary:     Effort: Pulmonary effort is normal.  Genitourinary:    Comments: Deferred Musculoskeletal:        General: Normal range of motion.     Cervical back: Normal range of motion.  Skin:    General: Skin is warm.     Comments: No rash noted to face  Neurological:     General: No focal deficit present.     Mental Status: [name] alert.    Review of Systems  Constitutional: Negative.  Negative for fever.  Respiratory: Negative.  Negative for shortness of breath.   Cardiovascular: Negative.  Negative for chest pain.  Gastrointestinal:  Negative for diarrhea, nausea and vomiting.  Skin:        Perceives is acne on face  Neurological: Negative.  Negative for headaches.  Psychiatric/Behavioral:  Negative for depression and substance abuse.    Blood pressure 110/75, pulse (!) 118, temperature 98.2 F (36.8 C), temperature source Oral, resp. rate 16, height 5\' 6"  (1.676 m), weight 72.6 kg, SpO2 96 %. Body mass index is 25.83 kg/m.  Treatment Plan Summary: Diagnoses / Active Problems: Bipolar I MRE manic severe with psychotic features  During this hospitalization patient made significant improvement regarding her mood and psychosis was no further mania or paranoia noted despite medication treatment she continues to have ongoing confusion as well as poor insight and judgment regarding her mental health needs and need for follow-up after discharge and need to establish safe residence to limit decompensation. Given today's evaluation and the earlier evaluations during this hospital stay, in my opinion patient at baseline lacks capacity to make medical or social decisions regarding placement or finances, she needs a guardian to make decisions on her behalf in her best interest.  PLAN:  Safety and Monitoring:  -- Converted to IVC admission  -- Daily contact with patient to assess and evaluate symptoms and progress in treatment  -- Patient's case to be  discussed in multi-disciplinary team meeting  -- Observation Level : q15 minute checks  -- Vital signs:  q12 hours  -- Precautions: suicide, elopement, and assault  -- Attempting off unit privileges and monitoring behavior  2. Psychiatric Diagnoses and Treatment:   Bipolar I MRE manic with psychotic features  -- Zyprexa was discontinued secondary to lack of efficacy  -Continue Haldol 10 mg BID for psychosis and mood stabilization, monitor  for SE.              -DC oral Invega  -Continue Cogentin 1 mg twice daily for EPS prophylaxis and continue to monitor.             -Continue Invega Sustenna 234 mg IM given on 09-30-21.  Lipid Panel: WNL except for LDL 101; HbgA1c: 5.3; QTc:469ms   -- Continue Depakene 1000 mg twice daily.  Depakote level on 6/7 therapeutic at 75.   Will need more meaningful discussion of psychoeducation about med and need for birth control as she clears CBC, LFT on 6/3 within normal limits   -- Ativan was discontinued few days ago to limit sedative agents.  -- Zyprexa/Geodon/ativan agitation protocol  -- Encouraged patient to participate in unit milieu and in scheduled group therapies     3. Medical Issues Being Addressed:   Mildly elevated creatinine   -- repeat CMP shows creatinine 1.08 and po hydration encouraged   Constipation  - continue colace 100mg  daily and encouraged PRN MOM   Season allergies  - continue Claritin 10mg  daily PRN  4. Discharge Planning:   -- Social work and case management to assist with discharge planning and identification of hospital follow-up needs prior to discharge  -- Discharge Concerns: Need to establish a safety plan; Medication compliance and effectiveness  -- Discharge Goals: Return home with outpatient referrals for mental health follow-up including medication management/psychotherapy Patient is homeless with limited insurance coverage, we will plan discharging to homeless shelters with ACT team resources and follow-up.  Referrals for ACT team have been placed by social work. Also recommending applying for disability and Medicaid to facilitate long-term placement which will be needed to avoid further decompensation and rehospitalization.  , MD Patient ID: MAQUITA SANDOVAL, female   DOB: [birth year], 56 y.o.   MRN: 05/19/1967 Patient ID: BROGAN ENGLAND, female   DOB: June 02, 1965, 56 y.o.   MRN: 05/19/1967 Patient ID: CABELA PACIFICO, female   DOB: 06-16-1965, 56 y.o.    MRN: 05/19/1967 Patient ID: JOHANNA MATTO, female   DOB: 01/24/1966, 56 y.o.   MRN: [birth year]1967

## 2021-10-04 NOTE — Group Note (Signed)
Occupational Therapy Group Note   Group Topic:Goal Setting  Group Date: 10/04/2021 Start Time: 1400 End Time: 1445 Facilitators: Ted Mcalpine, OT   Group Description: Group encouraged engagement and participation through discussion focused on goal setting. Group members were introduced to goal-setting using the SMART Goal framework, identifying goals as Specific, Measureable, Acheivable, Relevant, and Time-Bound. Group members took time from group to create their own personal goal reflecting the SMART goal template and shared for review by peers and OT.    In this group therapy session, we will delve into the power of routines and how they can positively impact our mental health and wellbeing. We will explore the concept of approaching routines from a systems and goals perspective, recognizing the interconnectedness of various aspects of our lives and the importance of setting meaningful goals. Together, we will discuss practical ideas and strategies for implementing effective routines in the inpatient setting, as well as maintaining them when transitioning back home and into the community. Through open and supportive dialogue, we will share personal experiences, challenges, and successes, allowing each participant to gain valuable insights and tools for fostering positive change in their lives. This discussion aims to empower and inspire individuals to embrace routines as a pathway to improved mental health and overall wellbeing.   Therapeutic Goal(s):  Identify at least one goal that fits the SMART framework    Participation Level: Active   Participation Quality: Moderate Cues   Behavior: Bizarre, Disruptive, Impulsive, and Off-task   Speech/Thought Process: Disorganized and Distracted   Affect/Mood: Full range   Insight: Poor   Judgement: Poor   Individualization: pt was present yet appeared to be responding to IS in their participation of group discussion/activity. Minimal new  skills identified  Modes of Intervention: Discussion and Education  Patient Response to Interventions:  Disengaged   Plan: Continue to engage patient in OT groups 2 - 3x/week.  10/04/2021  Ted Mcalpine, OT Kerrin Champagne, OT

## 2021-10-04 NOTE — Group Note (Signed)
Recreation Therapy Group Note   Group Topic:Team Building  Group Date: 10/04/2021 Start Time: 1000 End Time: 1020 Facilitators: Caroll Rancher, LRT,CTRS Location: [address] Dayroom   Goal Area(s) Addresses:  Patient will effectively work with peer towards shared goal.  Patient will identify skills used to make activity successful.  Patient will identify how skills used during activity can be used to reach post d/c goals.   Group Description:  Straw Bridge. In teams of 3-5, patients were given 15 plastic drinking straws and an equal length of masking tape. Using the materials provided, patients were instructed to build a free standing bridge-like structure to suspend an everyday item (ex: puzzle box) off of the floor or table surface. All materials were required to be used by the team in their design. LRT facilitated post-activity discussion reviewing team process. Patients were encouraged to reflect how the skills used in this activity can be generalized to daily life post discharge.   Affect/Mood: N/A   Participation Level: Did not attend    Clinical Observations/Individualized Feedback:     Plan: Continue to engage patient in RT group sessions 2-3x/week.   Caroll Rancher, Antonietta Jewel  10/04/2021 12:58 PM

## 2021-10-05 ENCOUNTER — Encounter (HOSPITAL_COMMUNITY): Payer: Self-pay

## 2021-10-05 DIAGNOSIS — F3112 Bipolar disorder, current episode manic without psychotic features, moderate: Secondary | ICD-10-CM | POA: Diagnosis not present

## 2021-10-05 NOTE — Progress Notes (Signed)
[facility] MD Progress Note  10/05/2021 2:20 PM SOLEIL MAS  MRN:  [id number]  Subjective: Natalie Brown states "I'm okay, just stressed because I'm still here"..  Reason for Admission:  Natalie Brown is a 56 y.o. female with a history of bipolar d/o, who was initially admitted for inpatient psychiatric hospitalization on 09/06/2021 for management of delusions and disorganized thinking.   Today's patient assessment Note: No as needed medications needed or given for agitation, patient continues to be compliant with medications as written, reportedly attending groups and participates with medications secondary to on and off confusion.  Upon evaluation patient continues to report good sleep and appetite, continues to deny SI HI or AVH, does not appear overtly psychotic paranoid or delusional.  She does continue to present confused preoccupied regard to her hearing earlier this week for the IVC and keeps noting that she spoke to the charge and she is staying only for 7 days which is partially correct and partially delusional.  She does not present with any sign of EPS or drooling and denies any side effects to current medication regimen.  Principal Problem: Bipolar 1 disorder with moderate mania (HCC)  Diagnosis: Active Problems:   Bipolar affective disorder, current episode manic with psychotic symptoms (HCC)  Past Psychiatric History: see H&P  Past Medical History:  Past Medical History:  Diagnosis Date   Bipolar 1 disorder with moderate mania (HCC) 12/30/2013   Hypertension    Family History: see H&P  Family Psychiatric  History: see H&P  Social History:  Social History   Substance and Sexual Activity  Alcohol Use No     Social History   Substance and Sexual Activity  Drug Use No    Social History   Socioeconomic History   Marital status: Married    Spouse name: Not on file   Number of children: Not on file   Years of education: Not on file   Highest education level: Not on file   Occupational History   Not on file  Tobacco Use   Smoking status: Every Day   Smokeless tobacco: Current  Substance and Sexual Activity   Alcohol use: No   Drug use: No   Sexual activity: Not on file  Other Topics Concern   Not on file  Social History Narrative   Not on file   Social Determinants of Health   Financial Resource Strain: Not on file  Food Insecurity: Not on file  Transportation Needs: Not on file  Physical Activity: Not on file  Stress: Not on file  Social Connections: Not on file   Sleep: Good  Appetite:  Good  Current Medications: Current Facility-Administered Medications  Medication Dose Route Frequency Provider Last Rate Last Admin   alum & mag hydroxide-simeth (MAALOX/MYLANTA) 200-200-20 MG/5ML suspension 30 mL  30 mL Oral Q4H PRN Massengill, Nathan, MD       benztropine (COGENTIN) tablet 1 mg  1 mg Oral BID PRN Bartholomew Crews E, MD   1 mg at 09/14/21 1254   benztropine (COGENTIN) tablet 1 mg  1 mg Oral BID Abbott Pao, Jerika Wales, MD   1 mg at 10/05/21 0813   benztropine mesylate (COGENTIN) injection 1 mg  1 mg Intramuscular BID PRN Bartholomew Crews E, MD       docusate sodium (COLACE) capsule 100 mg  100 mg Oral Daily Mason Jim, Amy E, MD   100 mg at 10/05/21 0813   feeding supplement (KATE FARMS STANDARD 1.4) liquid 325 mL  325 mL Oral BID  BM Massengill, Harrold Donath, MD   325 mL at 10/05/21 1138   fluticasone (FLONASE) 50 MCG/ACT nasal spray 1 spray  1 spray Each Nare Daily Massengill, Harrold Donath, MD   1 spray at 10/05/21 1025   haloperidol (HALDOL) tablet 10 mg  10 mg Oral BID Abbott Pao, [place] Lapaglia, MD   10 mg at 10/05/21 0813   hydrOXYzine (ATARAX) tablet 25 mg  25 mg Oral TID PRN Phineas Inches, MD   25 mg at 09/10/21 1204   LORazepam (ATIVAN) tablet 1 mg  1 mg Oral Q6H PRN Comer Locket, MD       magnesium hydroxide (MILK OF MAGNESIA) suspension 30 mL  30 mL Oral Daily PRN Massengill, Harrold Donath, MD       nicotine polacrilex (NICORETTE) gum 2 mg  2 mg Oral PRN Abbott Pao,  Baruch Lewers, MD   2 mg at 09/22/21 0821   OLANZapine zydis (ZYPREXA) disintegrating tablet 5 mg  5 mg Oral Q8H PRN Bartholomew Crews E, MD   5 mg at 09/14/21 1255   And   ziprasidone (GEODON) injection 20 mg  20 mg Intramuscular PRN Comer Locket, MD       valproic acid (DEPAKENE) 250 MG/5ML solution 1,000 mg  1,000 mg Oral BID Abbott Pao, Ting Cage, MD   1,000 mg at 10/05/21 0813   Lab Results:  No results found for this or any previous visit (from the past 48 hour(s)).  Blood Alcohol level:  Lab Results  Component Value Date   ETH <10 09/05/2021   Metabolic Disorder Labs: Lab Results  Component Value Date   HGBA1C 5.3 09/08/2021   MPG 105.41 09/08/2021   No results found for: "PROLACTIN" Lab Results  Component Value Date   CHOL 173 09/08/2021   TRIG 110 09/08/2021   HDL 50 09/08/2021   CHOLHDL 3.5 09/08/2021   VLDL 22 09/08/2021   LDLCALC 101 (H) 09/08/2021   LDLCALC 102 (H) 06/21/2020   Physical Findings:  Musculoskeletal: Strength & Muscle Tone: within normal limits Gait & Station: normal Patient leans: N/A  Psychiatric Specialty Exam: Psychiatric Specialty Exam: Physical Exam Vitals and nursing note reviewed.  Constitutional:      Appearance: Normal appearance.  HENT:     Head: Normocephalic.     Nose: Nose normal.     Mouth/Throat:     Pharynx: Oropharynx is clear.  Cardiovascular:     Rate and Rhythm: Normal rate.     Pulses: Normal pulses.  Pulmonary:     Effort: Pulmonary effort is normal.  Genitourinary:    Comments: Deferred Musculoskeletal:        General: Normal range of motion.     Cervical back: Normal range of motion.  Skin:    General: Skin is warm.     Comments: No rash noted to face  Neurological:     General: No focal deficit present.     Mental Status: [name] alert.     Review of Systems  Constitutional: Negative.  Negative for fever.  Respiratory: Negative.  Negative for shortness of breath.   Cardiovascular: Negative.  Negative for chest  pain.  Gastrointestinal:  Negative for diarrhea, nausea and vomiting.  Skin:        Perceives is acne on face  Neurological: Negative.  Negative for headaches.  Psychiatric/Behavioral:  Negative for depression and substance abuse.     Blood pressure 116/75, pulse (!) 102, temperature 98.4 F (36.9 C), temperature source Oral, resp. rate 18, height 5\' 6"  (1.676 m), weight 72.6  kg, SpO2 97 %.Body mass index is 25.83 kg/m.  General Appearance: Casual and Fairly Groomed  Eye Contact:  Good  Speech:  Clear and Coherent, disorganized.  Volume: Within normal limits  Mood: Euthymic, not depressed or euphoric  Affect:  Appropriate and Congruent  Thought Process:  Disorganized and Descriptions of Associations: Tangential  Orientation:  Full (Time, Place, and Person)  Thought Content: Disorganized at times but no paranoia or other delusions noted  Suicidal Thoughts:  No  Homicidal Thoughts:  No  Memory:  Immediate;   Fair Recent;   Fair Remote;   Poor  Judgement:  Impaired  Insight:  Fair  Psychomotor Activity:  Normal  Concentration:  Concentration: Fair and Attention Span: Fair  Recall:  [employer] of Knowledge:  Poor  Language:  Good  Akathisia:  Negative  Handed:  Right  AIMS (if indicated):     Assets:  Communication Skills Desire for Improvement Physical Health  ADL's:  Intact  Cognition:  Impaired,  Mild  Sleep:  Number of Hours: 9.25    Physical Exam Vitals and nursing note reviewed.  Constitutional:      Appearance: Normal appearance.  HENT:     Head: Normocephalic.     Nose: Nose normal.     Mouth/Throat:     Pharynx: Oropharynx is clear.  Cardiovascular:     Rate and Rhythm: Normal rate.     Pulses: Normal pulses.  Pulmonary:     Effort: Pulmonary effort is normal.  Genitourinary:    Comments: Deferred Musculoskeletal:        General: Normal range of motion.     Cervical back: Normal range of motion.  Skin:    General: Skin is warm.     Comments: No rash  noted to face  Neurological:     General: No focal deficit present.     Mental Status: [name] alert.    Review of Systems  Constitutional: Negative.  Negative for fever.  Respiratory: Negative.  Negative for shortness of breath.   Cardiovascular: Negative.  Negative for chest pain.  Gastrointestinal:  Negative for diarrhea, nausea and vomiting.  Skin:        Perceives is acne on face  Neurological: Negative.  Negative for headaches.  Psychiatric/Behavioral:  Negative for depression and substance abuse.    Blood pressure 116/75, pulse (!) 102, temperature 98.4 F (36.9 C), temperature source Oral, resp. rate 18, height 5\' 6"  (1.676 m), weight 72.6 kg, SpO2 97 %. Body mass index is 25.83 kg/m.  Treatment Plan Summary: Diagnoses / Active Problems: Bipolar I MRE manic severe with psychotic features  During this hospitalization patient made significant improvement regarding her mood and psychosis was no further mania or paranoia noted despite medication treatment she continues to have ongoing confusion as well as poor insight and judgment regarding her mental health needs and need for follow-up after discharge and need to establish safe residence to limit decompensation. Given today's evaluation and the earlier evaluations during this hospital stay, in my opinion patient at baseline lacks capacity to make medical or social decisions regarding placement or finances, she needs a guardian to make decisions on her behalf in her best interest.  PLAN: Safety and Monitoring:  -- Converted to IVC admission  -- Daily contact with patient to assess and evaluate symptoms and progress in treatment  -- Patient's case to be discussed in multi-disciplinary team meeting  -- Observation Level : q15 minute checks  -- Vital signs:  q12  hours  -- Precautions: suicide, elopement, and assault  -- Attempting off unit privileges and monitoring behavior  2. Psychiatric Diagnoses and Treatment:   Bipolar I  MRE manic with psychotic features  -- Zyprexa was discontinued secondary to lack of efficacy  -Continue Haldol 10 mg BID for psychosis and mood stabilization, monitor  for SE.              -DC oral Invega  -Continue Cogentin 1 mg twice daily for EPS prophylaxis, helped with drooling and continue to monitor.             -Continue Invega Sustenna 234 mg IM given on 09-30-21.  Lipid Panel: WNL except for LDL 101; HbgA1c: 5.3; QTc:435ms   -- Continue Depakene 1000 mg twice daily.  Depakote level on 6/7 therapeutic at 75.   Will need more meaningful discussion of psychoeducation about med and need for birth control as she clears CBC, LFT on 6/3 within normal limits   -- Ativan was discontinued few days ago to limit sedative agents.  -- Zyprexa/Geodon/ativan agitation protocol  -- Encouraged patient to participate in unit milieu and in scheduled group therapies     3. Medical Issues Being Addressed:   Mildly elevated creatinine   -- repeat CMP shows creatinine 1.08 and po hydration encouraged   Constipation  - continue colace 100mg  daily and encouraged PRN MOM   Season allergies  - continue Claritin 10mg  daily PRN  4. Discharge Planning:   -- Social work and case management to assist with discharge planning and identification of hospital follow-up needs prior to discharge  -- Discharge Concerns: Need to establish a safety plan; Medication compliance and effectiveness  -- Discharge Goals: Return home with outpatient referrals for mental health follow-up including medication management/psychotherapy Patient is homeless with limited insurance coverage, we will plan discharging to homeless shelters with ACT team resources and follow-up. Referrals for ACT team have been placed by social work. Also recommending applying for disability and Medicaid to facilitate long-term placement which will be needed to avoid further decompensation and rehospitalization.  Dian Situ, MD Patient ID: DANICA PEDREIRA, female   DOB: 1965/12/20, 56 y.o.   MRN: [id number] Patient ID: LORETTO VILLARI, female   DOB: [birth year], 56 y.o.   MRN: [id number] Patient ID: SOMALIA SPIZZIRRI, female   DOB: [birth year], 56 y.o.    MRN: [id number] Patient ID: LAKEYSA FITTING, female   DOB: 10-01-1965, 56 y.o.   MRN: [id number]

## 2021-10-05 NOTE — Progress Notes (Signed)
   10/05/21 2015  Psych Admission Type (Psych Patients Only)  Admission Status Involuntary  Psychosocial Assessment  Patient Complaints Worrying  Eye Contact Fair  Facial Expression Anxious  Affect Appropriate to circumstance  Speech Soft  Interaction Minimal  Motor Activity Slow  Appearance/Hygiene Disheveled  Behavior Characteristics Cooperative  Mood Preoccupied  Aggressive Behavior  Effect No apparent injury  Thought Process  Coherency Circumstantial  Content Preoccupation  Delusions None reported or observed  Perception WDL  Hallucination None reported or observed  Judgment Impaired  Confusion Mild  Danger to Self  Current suicidal ideation? Denies  Danger to Others  Danger to Others None reported or observed

## 2021-10-05 NOTE — Group Note (Signed)
Recreation Therapy Group Note   Group Topic:Self-Esteem  Group Date: 10/05/2021 Start Time: 1005 End Time: 1040 Facilitators: Caroll Rancher, LRT,CTRS Location: [address] Dayroom   Goal Area(s) Addresses:  Patient will successfully identify positive attributes about themselves.  Patient will identify healthy ways to increase self-esteem. Patient will acknowledge benefit(s) of improved self-esteem.   Group Description:  Radiation protection practitioner.  LRT and patients discussed the importance of having positive self esteem.  Patients were then given a worksheet of an outline of a picture frame.  Patients were to create an image of themselves, things they like and things they are proud of within the mirror.  Patients shared their mirrors with the group when completed.   Affect/Mood: Appropriate   Participation Level: Moderate   Participation Quality: Independent   Behavior: Appropriate   Speech/Thought Process: Distracted   Insight: Lacking   Judgement: Lacking    Modes of Intervention: Art   Patient Response to Interventions:  Attentive   Education Outcome:  Acknowledges education and In group clarification offered    Clinical Observations/Individualized Feedback: Pt appeared confused and unable to focus.  Pt left group early and didn't return.     Plan: Continue to engage patient in RT group sessions 2-3x/week.   Caroll Rancher, LRT,CTRS 10/05/2021 1:22 PM

## 2021-10-05 NOTE — BH IP Treatment Plan (Signed)
Interdisciplinary Treatment and Diagnostic Plan Update  10/05/2021 Time of Session: 0830  Natalie Brown MRN: [id number]  Principal Diagnosis: Bipolar 1 disorder with moderate mania (HCC)  Secondary Diagnoses: Active Problems:   Bipolar affective disorder, current episode manic with psychotic symptoms (HCC)   Current Medications:  Current Facility-Administered Medications  Medication Dose Route Frequency Provider Last Rate Last Admin   alum & mag hydroxide-simeth (MAALOX/MYLANTA) 200-200-20 MG/5ML suspension 30 mL  30 mL Oral Q4H PRN Massengill, Nathan, MD       benztropine (COGENTIN) tablet 1 mg  1 mg Oral BID PRN Bartholomew Crews E, MD   1 mg at 09/14/21 1254   benztropine (COGENTIN) tablet 1 mg  1 mg Oral BID Abbott Pao, Nadir, MD   1 mg at 10/05/21 0813   benztropine mesylate (COGENTIN) injection 1 mg  1 mg Intramuscular BID PRN Bartholomew Crews E, MD       docusate sodium (COLACE) capsule 100 mg  100 mg Oral Daily Mason Jim, Amy E, MD   100 mg at 10/05/21 0813   feeding supplement (KATE FARMS STANDARD 1.4) liquid 325 mL  325 mL Oral BID BM Massengill, Harrold Donath, MD   325 mL at 10/03/21 1402   fluticasone (FLONASE) 50 MCG/ACT nasal spray 1 spray  1 spray Each Nare Daily Massengill, Harrold Donath, MD   1 spray at 10/05/21 3419   haloperidol (HALDOL) tablet 10 mg  10 mg Oral BID Abbott Pao, Nadir, MD   10 mg at 10/05/21 0813   hydrOXYzine (ATARAX) tablet 25 mg  25 mg Oral TID PRN Phineas Inches, MD   25 mg at 09/10/21 1204   LORazepam (ATIVAN) tablet 1 mg  1 mg Oral Q6H PRN Mason Jim, Amy E, MD       magnesium hydroxide (MILK OF MAGNESIA) suspension 30 mL  30 mL Oral Daily PRN Massengill, Nathan, MD       nicotine polacrilex (NICORETTE) gum 2 mg  2 mg Oral PRN Abbott Pao, Nadir, MD   2 mg at 09/22/21 0821   OLANZapine zydis (ZYPREXA) disintegrating tablet 5 mg  5 mg Oral Q8H PRN Bartholomew Crews E, MD   5 mg at 09/14/21 1255   And   ziprasidone (GEODON) injection 20 mg  20 mg Intramuscular PRN Comer Locket, MD       valproic acid (DEPAKENE) 250 MG/5ML solution 1,000 mg  1,000 mg Oral BID Abbott Pao, Nadir, MD   1,000 mg at 10/05/21 0813   PTA Medications: Medications Prior to Admission  Medication Sig Dispense Refill Last Dose   CLARITIN 10 MG tablet Take 10 mg by mouth daily.      fluticasone (FLONASE) 50 MCG/ACT nasal spray Place 1 spray into both nostrils daily.      pseudoephedrine-guaifenesin (MUCINEX D) 60-600 MG 12 hr tablet Take 1 tablet by mouth every 12 (twelve) hours. 30 tablet 0     Patient Stressors: Health problems   Medication change or noncompliance    Patient Strengths: Capable of independent living  Printmaker for treatment/growth  Supportive family/friends   Treatment Modalities: Medication Management, Group therapy, Case management,  1 to 1 session with clinician, Psychoeducation, Recreational therapy.   Physician Treatment Plan for Primary Diagnosis: Bipolar 1 disorder with moderate mania (HCC) Long Term Goal(s): Improvement in symptoms so as ready for discharge   Short Term Goals: Ability to identify and develop effective coping behaviors will improve Ability to maintain clinical measurements within normal limits will improve Compliance with prescribed medications will improve  Ability to identify changes in lifestyle to reduce recurrence of condition will improve Ability to verbalize feelings will improve Ability to disclose and discuss suicidal ideas Ability to demonstrate self-control will improve  Medication Management: Evaluate patient's response, side effects, and tolerance of medication regimen.  Therapeutic Interventions: 1 to 1 sessions, Unit Group sessions and Medication administration.  Evaluation of Outcomes: Progressing  Physician Treatment Plan for Secondary Diagnosis: Active Problems:   Bipolar affective disorder, current episode manic with psychotic symptoms (HCC)  Long Term Goal(s): Improvement in symptoms so as ready  for discharge   Short Term Goals: Ability to identify and develop effective coping behaviors will improve Ability to maintain clinical measurements within normal limits will improve Compliance with prescribed medications will improve Ability to identify changes in lifestyle to reduce recurrence of condition will improve Ability to verbalize feelings will improve Ability to disclose and discuss suicidal ideas Ability to demonstrate self-control will improve     Medication Management: Evaluate patient's response, side effects, and tolerance of medication regimen.  Therapeutic Interventions: 1 to 1 sessions, Unit Group sessions and Medication administration.  Evaluation of Outcomes: Progressing   RN Treatment Plan for Primary Diagnosis: Bipolar 1 disorder with moderate mania (HCC) Long Term Goal(s): Knowledge of disease and therapeutic regimen to maintain health will improve  Short Term Goals: Ability to remain free from injury will improve, Ability to verbalize frustration and anger appropriately will improve, Ability to demonstrate self-control, Ability to participate in decision making will improve, Ability to verbalize feelings will improve, Ability to disclose and discuss suicidal ideas, Ability to identify and develop effective coping behaviors will improve, and Compliance with prescribed medications will improve  Medication Management: RN will administer medications as ordered by provider, will assess and evaluate patient's response and provide education to patient for prescribed medication. RN will report any adverse and/or side effects to prescribing provider.  Therapeutic Interventions: 1 on 1 counseling sessions, Psychoeducation, Medication administration, Evaluate responses to treatment, Monitor vital signs and CBGs as ordered, Perform/monitor CIWA, COWS, AIMS and Fall Risk screenings as ordered, Perform wound care treatments as ordered.  Evaluation of Outcomes:  Progressing   LCSW Treatment Plan for Primary Diagnosis: Bipolar 1 disorder with moderate mania (HCC) Long Term Goal(s): Safe transition to appropriate next level of care at discharge, Engage patient in therapeutic group addressing interpersonal concerns.  Short Term Goals: Engage patient in aftercare planning with referrals and resources, Increase social support, Increase ability to appropriately verbalize feelings, Increase emotional regulation, Facilitate acceptance of mental health diagnosis and concerns, Facilitate patient progression through stages of change regarding substance use diagnoses and concerns, Identify triggers associated with mental health/substance abuse issues, and Increase skills for wellness and recovery  Therapeutic Interventions: Assess for all discharge needs, 1 to 1 time with Social worker, Explore available resources and support systems, Assess for adequacy in community support network, Educate family and significant other(s) on suicide prevention, Complete Psychosocial Assessment, Interpersonal group therapy.  Evaluation of Outcomes: Progressing   Progress in Treatment: Attending groups: Yes. Participating in groups: No. Taking medication as prescribed: Yes. Toleration medication: Yes. Family/Significant other contact made: Yes, individual(s) contacted:  SPE completed with [name], Mother.  Patient understands diagnosis: No. Discussing patient identified problems/goals with staff: Yes. Medical problems stabilized or resolved: Yes. Denies suicidal/homicidal ideation: Yes. Issues/concerns per patient self-inventory: Yes. Other: none   New problem(s) identified: No, Describe:  none   New Short Term/Long Term Goal(s): Patient to work towards elimination of symptoms of psychosis, medication management  for mood stabilization; elimination of SI thoughts; development of comprehensive mental wellness/sobriety plan.  Patient Goals: No additional goals identified  at this time. Patient to continue to work towards original goals identified in initial treatment team meeting. CSW will remain available to patient should they voice additional treatment goals.   Discharge Plan or Barriers: Patient continues to be homeless, CSW working with community resources for housing and disability assistance.   Reason for Continuation of Hospitalization: Other; describe disorganized thoguhts and behaviors.   Estimated Length of Stay: 1-7 days    Last 3 [place] Suicide Severity Risk Score: Flowsheet Row [facility] (Current) [facility] from [facility]21 in [facility] from [facility]26/[facility] in [facility]  C-SSRS RISK CATEGORY High Risk High Risk No Risk       Last PHQ 2/9 Scores:    05/26/2020    2:14 PM 09/03/2017    4:34 PM 08/07/2016    2:12 PM  Depression screen PHQ 2/9  Decreased Interest 3 3 3   Down, Depressed, Hopeless 3 1 2   PHQ - 2 Score 6 4 5   Altered sleeping 0 3 3  Tired, decreased energy 0 3 0  Change in appetite 3 3 0  Feeling bad or failure about yourself  0 3 1  Trouble concentrating 3 0 0  Moving slowly or fidgety/restless 3 0 1  Suicidal thoughts 0 0 0  PHQ-9 Score 15 16 10     Scribe for Treatment Team: 10/05/2021 8:41 AM

## 2021-10-05 NOTE — [facility] Group Notes (Signed)
[facility] Group Notes:  (Nursing/MHT/Case Management/Adjunct)  Date:  10/05/2021  Time:  9:47 AM  Type of Therapy:   Orientation/Goals group  Participation Level:  Active  Participation Quality:  Attentive  Affect:  Appropriate  Cognitive:  Disorganized  Insight:  Lacking  Engagement in Group:  Engaged and Improving  Modes of Intervention:  Discussion, Education, Orientation, and Support  Summary of Progress/Problems: Pt goal for today is to talk with the social worker and doctor.   Natalie Brown 10/05/2021, 9:47 AM

## 2021-10-05 NOTE — Progress Notes (Signed)
   10/05/21 0515  Sleep  Number of Hours 9.25

## 2021-10-05 NOTE — Group Note (Signed)
LCSW Group Therapy Note   Group Date: 10/05/2021 Start Time: 1300 End Time: 1400   Type of Therapy and Topic:  Group Therapy: Boundaries  Participation Level:  Active  Description of Group: This group will address the use of boundaries in their personal lives. Patients will explore why boundaries are important, the difference between healthy and unhealthy boundaries, and negative and postive outcomes of different boundaries and will look at how boundaries can be crossed.  Patients will be encouraged to identify current boundaries in their own lives and identify what kind of boundary is being set. Facilitators will guide patients in utilizing problem-solving interventions to address and correct types boundaries being used and to address when no boundary is being used. Understanding and applying boundaries will be explored and addressed for obtaining and maintaining a balanced life. Patients will be encouraged to explore ways to assertively make their boundaries and needs known to significant others in their lives, using other group members and facilitator for role play, support, and feedback.  Therapeutic Goals:  1.  Patient will identify areas in their life where setting clear boundaries could be  used to improve their life.  2.  Patient will identify signs/triggers that a boundary is not being respected. 3.  Patient will identify two ways to set boundaries in order to achieve balance in  their lives: 4.  Patient will demonstrate ability to communicate their needs and set boundaries  through discussion and/or role plays  Summary of Patient Progress:  Caily was present/active throughout the session and proved open to feedback from CSW and peers. Patient demonstrated fair insight into the subject matter, was respectful of peers, and was present throughout the entire session. Patient was tangential during some parts of the group but was able to be redirectable.   Therapeutic Modalities:    Cognitive Behavioral Therapy Solution-Focused Therapy  Beatris Si, LCSW 10/05/2021  11:55 AM

## 2021-10-05 NOTE — Progress Notes (Signed)
   10/05/21 1100  Psych Admission Type (Psych Patients Only)  Admission Status Involuntary  Psychosocial Assessment  Patient Complaints Worrying  Eye Contact Fair  Facial Expression Anxious  Affect Appropriate to circumstance  Speech Soft  Interaction Minimal  Motor Activity Slow  Appearance/Hygiene Disheveled;Layered clothes  Behavior Characteristics Cooperative  Mood Preoccupied  Aggressive Behavior  Effect No apparent injury  Thought Process  Coherency Circumstantial  Content Preoccupation  Delusions None reported or observed  Perception WDL  Hallucination None reported or observed  Judgment Impaired  Confusion Mild  Danger to Self  Current suicidal ideation? Denies  Agreement Not to Harm Self Yes  Description of Agreement Verbal  Danger to Others  Danger to Others None reported or observed

## 2021-10-06 DIAGNOSIS — F3112 Bipolar disorder, current episode manic without psychotic features, moderate: Secondary | ICD-10-CM | POA: Diagnosis not present

## 2021-10-06 LAB — URINALYSIS, COMPLETE (UACMP) WITH MICROSCOPIC
Bacteria, UA: NONE SEEN
Bilirubin Urine: NEGATIVE
Glucose, UA: NEGATIVE mg/dL
Hgb urine dipstick: NEGATIVE
Ketones, ur: NEGATIVE mg/dL
Leukocytes,Ua: NEGATIVE
Nitrite: NEGATIVE
Protein, ur: NEGATIVE mg/dL
Specific Gravity, Urine: 1.02 (ref 1.005–1.030)
pH: 7 (ref 5.0–8.0)

## 2021-10-06 NOTE — Group Note (Signed)
LCSW Group Therapy Note  10/06/2021   10:00-11:00am   Type of Therapy and Topic:  Group Therapy: Anger Cues and Responses  Participation Level:  Minimal   Description of Group:   In this group, patients learned how to recognize the physical, cognitive, emotional, and behavioral responses they have to anger-provoking situations.  They identified a recent time they became angry and how they reacted.  They analyzed how their reaction was possibly beneficial and how it was possibly unhelpful.  The group discussed a variety of healthier coping skills that could help with such a situation in the future.  They also learned that anger is a second emotion fueled by other feelings and explored their own emotions that may frequently fuel their anger.  Focus was placed on how helpful it is to recognize the underlying emotions to our anger, because working on those can lead to a more permanent solution as well as our ability to focus on the important rather than the urgent.  Therapeutic Goals: Patients will remember their last incident of anger and how they felt emotionally and physically, what their thoughts were at the time, and how they behaved. Patients will identify how their behavior at that time worked for them, as well as how it worked against them. Patients will explore possible new behaviors to use in future anger situations. Patients will learn that anger itself is normal and cannot be eliminated, and that healthier reactions can assist with resolving conflict rather than worsening situations. Patients will learn that anger is a secondary emotion and worked to identify some of the underlying feelings that may lead to anger.  Summary of Patient Progress:  The patient shared that her most recent time of anger was with her mother and said her mother "belongs to the other side."  She was looking through the papers in her notebook and not paying attention.  She left the group, almost feel while doing so,  and was distracting.  At one point she spoke out loud about something unrelated to group and another patient asked her if she was speaking her thoughts out loud to make sense of them, to which she responded, "Yes."  Therapeutic Modalities:   Cognitive Behavioral Therapy  Lynnell Chad

## 2021-10-06 NOTE — Progress Notes (Signed)
[facility] MD Progress Note  10/06/2021 9:12 AM Natalie Brown  MRN:  [id number]  Subjective: Natalie Brown states "It has been so many days and I'm ready to go"..  Reason for Admission:  Natalie Brown is a 56 y.o. female with a history of bipolar d/o, who was initially admitted for inpatient psychiatric hospitalization on 09/06/2021 for management of delusions and disorganized thinking.   Today's patient assessment Note: No as needed medications needed or given for agitation, patient continues to be compliant with medications as written except refused flonase yesterday, reportedly attending groups.  Upon evaluation patient continues to report good sleep and appetite, continues to deny SI HI or AVH, does not appear overtly psychotic paranoid or delusional.  [name] is focused on discharge, states that she has been here for many days and is ready to go home.  She states that she can go to her mom's place and then [place] to [place].  [name] still preoccupied and keeps noting that she spoke to the charge and she is staying only for 7 days which is partially correct and partially delusional.  She does not present with any sign of EPS or drooling and denies any side effects to current medication regimen.  She she reports that she wet her bed and nurse had to change sheets.  She denies any urinary symptoms.  Discussed that we will send urinalysis just to make sure that she does not have any infection.  Principal Problem: Bipolar 1 disorder with moderate mania (HCC)  Diagnosis: Active Problems:   Bipolar affective disorder, current episode manic with psychotic symptoms (HCC)  Past Psychiatric History: see H&P  Past Medical History:  Past Medical History:  Diagnosis Date   Bipolar 1 disorder with moderate mania (HCC) 12/30/2013   Hypertension    Family History: see H&P  Family Psychiatric  History: see H&P  Social History:  Social History   Substance and Sexual Activity  Alcohol Use No     Social  History   Substance and Sexual Activity  Drug Use No    Social History   Socioeconomic History   Marital status: Married    Spouse name: Not on file   Number of children: Not on file   Years of education: Not on file   Highest education level: Not on file  Occupational History   Not on file  Tobacco Use   Smoking status: Every Day   Smokeless tobacco: Current  Substance and Sexual Activity   Alcohol use: No   Drug use: No   Sexual activity: Not on file  Other Topics Concern   Not on file  Social History Narrative   Not on file   Social Determinants of Health   Financial Resource Strain: Not on file  Food Insecurity: Not on file  Transportation Needs: Not on file  Physical Activity: Not on file  Stress: Not on file  Social Connections: Not on file   Sleep: Good  Appetite:  Good  Current Medications: Current Facility-Administered Medications  Medication Dose Route Frequency Provider Last Rate Last Admin   alum & mag hydroxide-simeth (MAALOX/MYLANTA) 200-200-20 MG/5ML suspension 30 mL  30 mL Oral Q4H PRN Massengill, Nathan, MD       benztropine (COGENTIN) tablet 1 mg  1 mg Oral BID PRN Bartholomew Crews E, MD   1 mg at 09/14/21 1254   benztropine (COGENTIN) tablet 1 mg  1 mg Oral BID Abbott Pao, Nadir, MD   1 mg at 10/06/21 [phone]  benztropine mesylate (COGENTIN) injection 1 mg  1 mg Intramuscular BID PRN Mason Jim, Amy E, MD       docusate sodium (COLACE) capsule 100 mg  100 mg Oral Daily Mason Jim, Amy E, MD   100 mg at 10/06/21 0802   feeding supplement (KATE FARMS STANDARD 1.4) liquid 325 mL  325 mL Oral BID BM Massengill, Nathan, MD   325 mL at 10/05/21 1427   fluticasone (FLONASE) 50 MCG/ACT nasal spray 1 spray  1 spray Each Nare Daily Massengill, Harrold Donath, MD   1 spray at 10/05/21 6389   haloperidol (HALDOL) tablet 10 mg  10 mg Oral BID Abbott Pao, Nadir, MD   10 mg at 10/06/21 0802   hydrOXYzine (ATARAX) tablet 25 mg  25 mg Oral TID PRN Phineas Inches, MD   25 mg at  09/10/21 1204   LORazepam (ATIVAN) tablet 1 mg  1 mg Oral Q6H PRN Mason Jim, Amy E, MD       magnesium hydroxide (MILK OF MAGNESIA) suspension 30 mL  30 mL Oral Daily PRN Massengill, Harrold Donath, MD       nicotine polacrilex (NICORETTE) gum 2 mg  2 mg Oral PRN Abbott Pao, Nadir, MD   2 mg at 09/22/21 0821   OLANZapine zydis (ZYPREXA) disintegrating tablet 5 mg  5 mg Oral Q8H PRN Bartholomew Crews E, MD   5 mg at 09/14/21 1255   And   ziprasidone (GEODON) injection 20 mg  20 mg Intramuscular PRN Comer Locket, MD       valproic acid (DEPAKENE) 250 MG/5ML solution 1,000 mg  1,000 mg Oral BID Abbott Pao, Nadir, MD   1,000 mg at 10/06/21 0802   Lab Results:  No results found for this or any previous visit (from the past 48 hour(s)).  Blood Alcohol level:  Lab Results  Component Value Date   ETH <10 09/05/2021   Metabolic Disorder Labs: Lab Results  Component Value Date   HGBA1C 5.3 09/08/2021   MPG 105.41 09/08/2021   No results found for: "PROLACTIN" Lab Results  Component Value Date   CHOL 173 09/08/2021   TRIG 110 09/08/2021   HDL 50 09/08/2021   CHOLHDL 3.5 09/08/2021   VLDL 22 09/08/2021   LDLCALC 101 (H) 09/08/2021   LDLCALC 102 (H) 06/21/2020   Physical Findings:  Musculoskeletal: Strength & Muscle Tone: within normal limits Gait & Station: normal Patient leans: N/A  Psychiatric Specialty Exam: Psychiatric Specialty Exam: Physical Exam Vitals and nursing note reviewed.  Constitutional:      General: [name] not in acute distress.    Appearance: [name] not ill-appearing, toxic-appearing or diaphoretic.  HENT:     Head: Normocephalic.  Pulmonary:     Effort: Pulmonary effort is normal.  Neurological:     General: No focal deficit present.     Mental Status: [name] alert.     Review of Systems  Constitutional: Negative.  Negative for fever.  Respiratory: Negative.  Negative for shortness of breath.   Cardiovascular: Negative.  Negative for chest pain.  Gastrointestinal:   Negative for diarrhea, nausea and vomiting.  Neurological:  Negative for tremors and headaches.  Psychiatric/Behavioral:  Negative for hallucinations and suicidal ideas. The patient is not nervous/anxious.     Blood pressure 108/78, pulse 97, temperature 98.5 F (36.9 C), temperature source Oral, resp. rate 16, height 5\' 6"  (1.676 m), weight 72.6 kg, SpO2 100 %.Body mass index is 25.83 kg/m.  General Appearance:  Disheveled, layered clothing  Eye Contact:  Fair  Speech:  Clear and Coherent,   Volume: Within normal limits  Mood: Euthymic, not depressed or euphoric  Affect:  Appropriate and Congruent  Thought Process:  Descriptions of Associations: Tangential  Orientation:  Full (Time, Place, and Person)  Thought Content: Disorganized at times but no paranoia or other delusions noted  Suicidal Thoughts:  No  Homicidal Thoughts:  No  Memory:  Immediate;   Fair Recent;   Fair Remote;   Poor  Judgement:  Impaired  Insight:  Fair  Psychomotor Activity:  Normal  Concentration:  Concentration: Fair and Attention Span: Fair  Recall:  [employer] of Knowledge:  Poor  Language:  Good  Akathisia:  Negative  Handed:  Ambidextrous  AIMS (if indicated):     Assets:  Communication Skills Desire for Improvement Physical Health  ADL's:  Intact  Cognition:  Impaired,  Mild  Sleep:  Number of Hours: 6.25   Blood pressure 108/78, pulse 97, temperature 98.5 F (36.9 C), temperature source Oral, resp. rate 16, height 5\' 6"  (1.676 m), weight 72.6 kg, SpO2 100 %. Body mass index is 25.83 kg/m.  Treatment Plan Summary: Diagnoses / Active Problems: Bipolar I MRE manic severe with psychotic features  During this hospitalization patient made significant improvement regarding her mood and psychosis was no further mania or paranoia noted despite medication treatment she continues to have  poor insight and judgment regarding her mental health needs and need for follow-up after discharge and need to  establish safe residence to limit decompensation. Given today's evaluation and the earlier evaluations during this hospital stay, in my opinion patient at baseline lacks capacity to make medical or social decisions regarding placement or finances, she needs a guardian to make decisions on her behalf in her best interest.  PLAN: Safety and Monitoring:  -- Converted to IVC admission  -- Daily contact with patient to assess and evaluate symptoms and progress in treatment  -- Patient's case to be discussed in multi-disciplinary team meeting  -- Observation Level : q15 minute checks  -- Vital signs:  q12 hours  -- Precautions: suicide, elopement, and assault  -- Attempting off unit privileges and monitoring behavior  2. Psychiatric Diagnoses and Treatment:   Bipolar I MRE manic with psychotic features  -- Zyprexa was discontinued secondary to lack of efficacy  -Continue Haldol 10 mg BID for psychosis and mood stabilization, monitor  for SE.              -DC oral Invega  -Continue Cogentin 1 mg twice daily for EPS prophylaxis, helped with drooling and continue to monitor.             -Continue Invega Sustenna 234 mg IM given on 09-30-21.  Lipid Panel: WNL except for LDL 101; HbgA1c: 5.3; QTc:471ms   -- Continue Depakene 1000 mg twice daily.  Depakote level on 6/7 therapeutic at 75.   Will need more meaningful discussion of psychoeducation about med and need for birth control as she clears CBC, LFT on 6/3 within normal limits   -- Ativan was discontinued few days ago to limit sedative agents.  -- Zyprexa/Geodon/ativan agitation protocol  -- Encouraged patient to participate in unit milieu and in scheduled group therapies     3. Medical Issues Being Addressed:   Mildly elevated creatinine   -- Last CMP shows creatinine 1.08 and po hydration encouraged   Constipation  - continue colace 100mg  daily and encouraged PRN MOM   Season allergies  - continue Claritin 10mg  daily  PRN              Urinary incontinence            -Send urinalysis  4. Discharge Planning:   -- Social work and case management to assist with discharge planning and identification of hospital follow-up needs prior to discharge  -- Discharge Concerns: Need to establish a safety plan; Medication compliance and effectiveness  -- Discharge Goals: Return home with outpatient referrals for mental health follow-up including medication management/psychotherapy Patient is homeless with limited insurance coverage, we will plan discharging to homeless shelters with ACT team resources and follow-up. Referrals for ACT team have been placed by social work. Also recommending applying for disability and Medicaid to facilitate long-term placement which will be needed to avoid further decompensation and rehospitalization.  Karsten Ro, MD PGY2 Psychiatry Resident

## 2021-10-06 NOTE — [facility] Group Notes (Signed)
.  Psychoeducational Group Note  Date: 10/06/2021 Time: 0900-1000    Goal Setting   Purpose of Group: This group helps to provide patients with the steps of setting a goal that is specific, measurable, attainable, realistic and time specific. A discussion on how we keep ourselves stuck with negative self talk. Homework given for Patients to write 30 positive attributes about themselves.    Participation Level:  Active  Participation Quality:  Appropriate  Affect:  Appropriate  Cognitive:  Appropriate  Insight:  Improving  Engagement in Group:  Engaged  Additional Comments: Rates her energy at a 4/10. States that she is here due to depression. Participated in the group.  Dione Housekeeper

## 2021-10-06 NOTE — Group Note (Deleted)
LCSW Group Therapy Note   Group Date: 10/06/2021 Start Time: 1000 End Time: 1100   Type of Therapy and Topic:  Group Therapy:   Participation Level:  {[facility] PARTICIPATION LEVEL:22264}  Description of Group:   Therapeutic Goals:  1.     Summary of Patient Progress:    ***  Therapeutic Modalities:   Lexani Corona J Grossman-Orr, LCSWA 10/06/2021  9:41 AM    

## 2021-10-06 NOTE — Progress Notes (Signed)
Adult Psychoeducational Group Note  Date:  10/06/2021 Time:  9:43 PM  Group Topic/Focus:  Wrap-Up Group:   The focus of this group is to help patients review their daily goal of treatment and discuss progress on daily workbooks.  Participation Level:  Did Not Attend  Participation Quality:  Did Not Attend  Affect:  Did Not Attend  Cognitive:  Did Not Attend  Insight: None  Engagement in Group:  Did Not Attend  Modes of Intervention:  Did Not Attend  Additional Comments:   Pt was encouraged to attend group discussion but refused   Vevelyn Pat 10/06/2021, 9:43 PM

## 2021-10-06 NOTE — Progress Notes (Signed)
   10/06/21 0530  Sleep  Number of Hours 6.25

## 2021-10-06 NOTE — Progress Notes (Signed)
   10/06/21 1100  Psych Admission Type (Psych Patients Only)  Admission Status Involuntary  Psychosocial Assessment  Patient Complaints Worrying  Eye Contact Fair  Facial Expression Anxious  Affect Appropriate to circumstance  Speech Soft  Interaction Minimal  Motor Activity Slow  Appearance/Hygiene Disheveled  Behavior Characteristics Cooperative  Mood Preoccupied  Aggressive Behavior  Effect No apparent injury  Thought Process  Coherency Circumstantial  Content Preoccupation  Delusions None reported or observed  Perception WDL  Hallucination None reported or observed  Judgment Impaired  Confusion Mild  Danger to Self  Current suicidal ideation? Denies  Agreement Not to Harm Self Yes  Description of Agreement Verbal  Danger to Others  Danger to Others None reported or observed

## 2021-10-07 DIAGNOSIS — F3112 Bipolar disorder, current episode manic without psychotic features, moderate: Secondary | ICD-10-CM | POA: Diagnosis not present

## 2021-10-07 DIAGNOSIS — R7989 Other specified abnormal findings of blood chemistry: Secondary | ICD-10-CM | POA: Diagnosis not present

## 2021-10-07 DIAGNOSIS — T426X5A Adverse effect of other antiepileptic and sedative-hypnotic drugs, initial encounter: Secondary | ICD-10-CM | POA: Diagnosis not present

## 2021-10-07 DIAGNOSIS — R4182 Altered mental status, unspecified: Secondary | ICD-10-CM | POA: Diagnosis not present

## 2021-10-07 LAB — CBC
HCT: 38.4 % (ref 36.0–46.0)
Hemoglobin: 12.6 g/dL (ref 12.0–15.0)
MCH: 30.5 pg (ref 26.0–34.0)
MCHC: 32.8 g/dL (ref 30.0–36.0)
MCV: 93 fL (ref 80.0–100.0)
Platelets: 195 10*3/uL (ref 150–400)
RBC: 4.13 MIL/uL (ref 3.87–5.11)
RDW: 13.3 % (ref 11.5–15.5)
WBC: 4.6 10*3/uL (ref 4.0–10.5)
nRBC: 0 % (ref 0.0–0.2)

## 2021-10-07 LAB — BASIC METABOLIC PANEL
Anion gap: 5 (ref 5–15)
BUN: 22 mg/dL — ABNORMAL HIGH (ref 6–20)
CO2: 28 mmol/L (ref 22–32)
Calcium: 8.6 mg/dL — ABNORMAL LOW (ref 8.9–10.3)
Chloride: 108 mmol/L (ref 98–111)
Creatinine, Ser: 1.19 mg/dL — ABNORMAL HIGH (ref 0.44–1.00)
GFR, Estimated: 54 mL/min — ABNORMAL LOW (ref >60)
Glucose, Bld: 79 mg/dL (ref 70–99)
Potassium: 4.6 mmol/L (ref 3.5–5.1)
Sodium: 141 mmol/L (ref 135–145)

## 2021-10-07 LAB — HEPATIC FUNCTION PANEL
ALT: 9 U/L (ref 0–44)
AST: 17 U/L (ref 15–41)
Albumin: 3.3 g/dL — ABNORMAL LOW (ref 3.5–5.0)
Alkaline Phosphatase: 45 U/L (ref 38–126)
Bilirubin, Direct: 0.1 mg/dL (ref 0.0–0.2)
Indirect Bilirubin: 0.2 mg/dL — ABNORMAL LOW (ref 0.3–0.9)
Total Bilirubin: 0.3 mg/dL (ref 0.3–1.2)
Total Protein: 6.6 g/dL (ref 6.5–8.1)

## 2021-10-07 LAB — VALPROIC ACID LEVEL: Valproic Acid Lvl: 136 ug/mL — ABNORMAL HIGH (ref 50.0–100.0)

## 2021-10-07 LAB — AMMONIA: Ammonia: 99 umol/L — ABNORMAL HIGH (ref 9–35)

## 2021-10-07 MED ORDER — LACTULOSE 10 GM/15ML PO SOLN
20.0000 g | Freq: Every day | ORAL | Status: DC
  Administered 2021-10-07 – 2021-10-12 (×6): 20 g via ORAL
  Filled 2021-10-07 (×7): qty 30

## 2021-10-07 MED ORDER — VALPROIC ACID 250 MG/5ML PO SOLN
500.0000 mg | Freq: Two times a day (BID) | ORAL | Status: AC
  Administered 2021-10-08 – 2021-10-09 (×4): 500 mg via ORAL
  Filled 2021-10-07 (×5): qty 10

## 2021-10-07 NOTE — Group Note (Signed)
[facility] Group Notes: (Clinical Social Work)   10/07/2021      Type of Therapy:  Group Therapy   Participation Level:  Did Not Attend - was invited both individually by MHT and by overhead announcement, chose not to attend.   Ambrose Mantle, LCSW 10/07/2021, 1:43 PM

## 2021-10-07 NOTE — Progress Notes (Signed)
   10/07/21 2245  Psych Admission Type (Psych Patients Only)  Admission Status Involuntary  Psychosocial Assessment  Patient Complaints Disorientation;Worrying  Eye Contact Fair  Facial Expression Flat  Affect Depressed  Speech Slurred  Interaction Cautious  Motor Activity Unsteady  Appearance/Hygiene Disheveled  Behavior Characteristics Unwilling to participate  Mood Preoccupied;Apprehensive  Aggressive Behavior  Effect No apparent injury  Thought Process  Coherency Disorganized  Content Paranoia  Delusions Paranoid  Perception Depersonalization  Hallucination None reported or observed  Judgment Impaired  Confusion Mild  Danger to Self  Current suicidal ideation? Denies  Danger to Others  Danger to Others None reported or observed

## 2021-10-07 NOTE — Progress Notes (Signed)
Adult Psychoeducational Group Note  Date:  10/07/2021 Time:  8:47 PM  Group Topic/Focus:  Wrap-Up Group:   The focus of this group is to help patients review their daily goal of treatment and discuss progress on daily workbooks.  Participation Level:  Did Not Attend  Participation Quality:  Did Not Attend  Affect:  Did Not Attend  Cognitive:  Did Not Attend  Insight: Did Not Attend  Engagement in Group:  Did Not Attend  Modes of Intervention:  Did Not Attend  Additional Comments:   Pt was encouraged to attend group discussion but refused.  Vevelyn Pat 10/07/2021, 8:47 PM

## 2021-10-07 NOTE — Progress Notes (Signed)
   10/07/21 0900  Psych Admission Type (Psych Patients Only)  Admission Status Involuntary  Psychosocial Assessment  Patient Complaints Disorientation;Shakiness;Worrying  Eye Contact Avoids  Facial Expression Blank  Affect Depressed;Preoccupied  Speech Slurred  Interaction Superficial  Motor Activity Tremors;Unsteady;Restless  Appearance/Hygiene Disheveled  Behavior Characteristics Unable to participate  Mood Preoccupied;Apprehensive  Aggressive Behavior  Effect No apparent injury  Thought Process  Coherency Disorganized;Incoherent  Content UTA  Delusions Paranoid  Perception UTA  Hallucination UTA  Judgment Impaired  Confusion Moderate  Danger to Self  Current suicidal ideation? Denies  Agreement Not to Harm Self Yes  Description of Agreement Verbal  Danger to Others  Danger to Others None reported or observed

## 2021-10-07 NOTE — Progress Notes (Signed)
Nursing 1:1 note D:Pt observed sleeping in bed with eyes closed. RR even and unlabored. No distress noted. A: 1:1 observation continues for safety  R: pt remains safe  

## 2021-10-07 NOTE — Progress Notes (Signed)
Patient observed not being able to walk on her own. Unsteady gait. Unable to hold things in her hand. Patient confused. Placed on 1:1 observation. Labs drawn. Patient in her room laying in bed with staff member at bedside.

## 2021-10-07 NOTE — [facility] Group Notes (Signed)
Adult Psychoeducational Group Not Date:  10/07/2021 Time:  0900-1045 Group Topic/Focus: PROGRESSIVE RELAXATION. A group where deep breathing is taught and tensing and relaxation muscle groups is used. Imagery is used as well.  Pts are asked to imagine 3 pillars that hold them up when they are not able to hold themselves up and to share that with the group.  Participation Level: did not attend  Glynn Freas A   

## 2021-10-07 NOTE — Progress Notes (Signed)
Nursing 1:1 note  D: Patient observed in dayroom watching T.V. Patient continues to attempt to get up and walk with unsteady gait.   A: 1:1 continued for patient safety    R: Patient remains safe

## 2021-10-07 NOTE — Progress Notes (Signed)
Patient had a visit with a friend on shift, she spent most of the shift resting in bed, she was compliant with medication regime on shift. During her sleep she was reported to be incontinent of urine. She had minimal interaction on shift with peers and staff due to her resting in bed.

## 2021-10-07 NOTE — Progress Notes (Signed)
[facility] MD Progress Note  10/07/2021 7:56 AM Natalie Brown  MRN:  [id number]  Subjective: Medrith states "It has been so many days and I'm ready to go"..  Reason for Admission:  Natalie Brown is a 56 y.o. female with a history of bipolar d/o, who was initially admitted for inpatient psychiatric hospitalization on 09/06/2021 for management of delusions and disorganized thinking.   Today's patient assessment Note: No PRN medications needed or given for agitation, patient continues to be compliant with medications as written except refused supplement protein shake and flonase yesterday, reportedly attending groups.  NS reported that pt has been declining since yesterday, is confused, and has trouble doing her ADLs.  [name] unsteady, urinating in her bed and unable to hold a cup to drink. Upon evaluation patient appears confused and was trying to get up from bed continuously.  She urinated on herself and needed a change of clothes and sheets.  [name] oriented to place, date, month and year but not oriented to situation.  She knows current president but does not know previous presidents.  Not able to do DOWB.  [name] wobbly and needs support during walking. She continues to report good sleep and appetite, continues to deny SI HI or AVH, does not appear overtly psychotic paranoid or delusional.  [name] still confused and talks about some lawyer, packing her stuff and then tells me days of weeks without being asked.  She reports good mood and denies any depression or anxiety.  She does not present with any sign of EPS or drooling and denies any side effects to current medication regimen.  Discussed that her valproic less acid level was high so we reduced the dose of Depakote from tomorrow and will skip tonight's dose of Depakote.   Principal Problem: Bipolar 1 disorder with moderate mania (HCC)  Diagnosis: Active Problems:   Bipolar affective disorder, current episode manic with psychotic symptoms  (HCC)  Past Psychiatric History: see H&P  Past Medical History:  Past Medical History:  Diagnosis Date   Bipolar 1 disorder with moderate mania (HCC) 12/30/2013   Hypertension    Family History: see H&P  Family Psychiatric  History: see H&P  Social History:  Social History   Substance and Sexual Activity  Alcohol Use No     Social History   Substance and Sexual Activity  Drug Use No    Social History   Socioeconomic History   Marital status: Married    Spouse name: Not on file   Number of children: Not on file   Years of education: Not on file   Highest education level: Not on file  Occupational History   Not on file  Tobacco Use   Smoking status: Every Day   Smokeless tobacco: Current  Substance and Sexual Activity   Alcohol use: No   Drug use: No   Sexual activity: Not on file  Other Topics Concern   Not on file  Social History Narrative   Not on file   Social Determinants of Health   Financial Resource Strain: Not on file  Food Insecurity: Not on file  Transportation Needs: Not on file  Physical Activity: Not on file  Stress: Not on file  Social Connections: Not on file   Sleep: Good  Appetite:  Good  Current Medications: Current Facility-Administered Medications  Medication Dose Route Frequency Provider Last Rate Last Admin   alum & mag hydroxide-simeth (MAALOX/MYLANTA) 200-200-20 MG/5ML suspension 30 mL  30 mL  Oral Q4H PRN Massengill, [name], MD       benztropine (COGENTIN) tablet 1 mg  1 mg Oral BID PRN Harlow Asa, MD   1 mg at 09/14/21 1254   benztropine (COGENTIN) tablet 1 mg  1 mg Oral BID Winfred Leeds, Nadir, MD   1 mg at 10/06/21 2115   benztropine mesylate (COGENTIN) injection 1 mg  1 mg Intramuscular BID PRN Harlow Asa, MD       docusate sodium (COLACE) capsule 100 mg  100 mg Oral Daily Nelda Marseille, Amy E, MD   100 mg at 10/06/21 0802   feeding supplement (KATE FARMS STANDARD 1.4) liquid 325 mL  325 mL Oral BID BM Massengill,  [name], MD   325 mL at 10/06/21 1528   fluticasone (FLONASE) 50 MCG/ACT nasal spray 1 spray  1 spray Each Nare Daily Massengill, [name], MD   1 spray at 10/05/21 [id number]   haloperidol (HALDOL) tablet 10 mg  10 mg Oral BID Dian Situ, MD   10 mg at 10/06/21 2115   hydrOXYzine (ATARAX) tablet 25 mg  25 mg Oral TID PRN Janine Limbo, MD   25 mg at 09/10/21 1204   LORazepam (ATIVAN) tablet 1 mg  1 mg Oral Q6H PRN Harlow Asa, MD       magnesium hydroxide (MILK OF MAGNESIA) suspension 30 mL  30 mL Oral Daily PRN Massengill, [name], MD       nicotine polacrilex (NICORETTE) gum 2 mg  2 mg Oral PRN Winfred Leeds, Nadir, MD   2 mg at 09/22/21 0821   OLANZapine zydis (ZYPREXA) disintegrating tablet 5 mg  5 mg Oral Q8H PRN Harlow Asa, MD   5 mg at 09/14/21 1255   And   ziprasidone (GEODON) injection 20 mg  20 mg Intramuscular PRN Harlow Asa, MD       valproic acid (DEPAKENE) 250 MG/5ML solution 1,000 mg  1,000 mg Oral BID Winfred Leeds, Nadir, MD   1,000 mg at 10/06/21 1624   Lab Results:  Results for orders placed or performed during the hospital encounter of 09/06/21 (from the past 48 hour(s))  Urinalysis, Complete w Microscopic Urine, Clean Catch     Status: None   Collection Time: 10/06/21  1:55 PM  Result Value Ref Range   Color, Urine YELLOW YELLOW   APPearance CLEAR CLEAR   Specific Gravity, Urine 1.020 1.005 - 1.030   pH 7.0 5.0 - 8.0   Glucose, UA NEGATIVE NEGATIVE mg/dL   Hgb urine dipstick NEGATIVE NEGATIVE   Bilirubin Urine NEGATIVE NEGATIVE   Ketones, ur NEGATIVE NEGATIVE mg/dL   Protein, ur NEGATIVE NEGATIVE mg/dL   Nitrite NEGATIVE NEGATIVE   Leukocytes,Ua NEGATIVE NEGATIVE   RBC / HPF 0-5 0 - 5 RBC/hpf   WBC, UA 0-5 0 - 5 WBC/hpf   Bacteria, UA NONE SEEN NONE SEEN   Squamous Epithelial / LPF 0-5 0 - 5    Comment: Performed at [facility], [place] [address] [place], [place] [address]    Blood Alcohol level:  Lab Results  Component Value Date   ETH  <10 [id number]   Metabolic Disorder Labs: Lab Results  Component Value Date   HGBA1C 5.3 09/08/2021   MPG 105.41 09/08/2021   No results found for: "PROLACTIN" Lab Results  Component Value Date   CHOL 173 09/08/2021   TRIG 110 09/08/2021   HDL 50 09/08/2021   CHOLHDL 3.5 09/08/2021   VLDL 22 09/08/2021   LDLCALC 101 (H) 09/08/2021  LDLCALC 102 (H) 06/21/2020   Physical Findings:  Musculoskeletal: Strength & Muscle Tone: within normal limits Gait & Station: normal Patient leans: N/A  Psychiatric Specialty Exam: Psychiatric Specialty Exam: Physical Exam Vitals and nursing note reviewed.  Constitutional:      General: [name] not in acute distress.    Appearance: [name] not ill-appearing, toxic-appearing or diaphoretic.  HENT:     Head: Normocephalic.  Pulmonary:     Effort: Pulmonary effort is normal.  Neurological:     Mental Status: [name] alert.     Comments: Appears confused, partially oriented     Review of Systems  Constitutional: Negative.  Negative for fever.  Respiratory: Negative.  Negative for shortness of breath.   Cardiovascular: Negative.  Negative for chest pain.  Gastrointestinal:  Negative for diarrhea, nausea and vomiting.  Genitourinary:        Urinary incontinence  Neurological:  Negative for tremors and headaches.  Psychiatric/Behavioral:  Negative for hallucinations and suicidal ideas. The patient is not nervous/anxious.     Blood pressure 99/79, pulse 90, temperature 98.5 F (36.9 C), temperature source Oral, resp. rate 16, height 5\' 6"  (1.676 m), weight 72.6 kg, SpO2 92 %.Body mass index is 25.83 kg/m.  General Appearance: appropriate for environment  Eye Contact:  Fair  Speech:  Clear and Coherent,   Volume: Within normal limits  Mood: Euthymic, not depressed or euphoric  Affect:  Appropriate and Congruent  Thought Process:  Disorganized and Descriptions of Associations: Tangential  Orientation:  Other:  partially oriented  Thought  Content: Disorganized at times but no paranoia or other delusions noted  Suicidal Thoughts:  No  Homicidal Thoughts:  No  Memory:  Immediate;   Poor Recent;   Poor Remote;   Poor  Judgement:  Impaired  Insight:  Fair  Psychomotor Activity:  Normal  Concentration:  Concentration: Fair and Attention Span: Fair  Recall:  [employer] of Knowledge:  Poor  Language:  Good  Akathisia:  Negative  Handed:  Ambidextrous  AIMS (if indicated):     Assets:  Communication Skills Desire for Improvement Physical Health  ADL's:  Impaired  Cognition:  Impaired,  Mild  Sleep:  Number of Hours: 6.25   Blood pressure 99/79, pulse 90, temperature 98.5 F (36.9 C), temperature source Oral, resp. rate 16, height 5\' 6"  (1.676 m), weight 72.6 kg, SpO2 92 %. Body mass index is 25.83 kg/m.  Treatment Plan Summary: Diagnoses / Active Problems: Bipolar I MRE manic severe with psychotic features  During this hospitalization patient made significant improvement regarding her mood and psychosis was no further mania or paranoia noted despite medication treatment she continues to have  poor insight and judgment regarding her mental health needs and need for follow-up after discharge and need to establish safe residence to limit decompensation. Given today's evaluation and the earlier evaluations during this hospital stay, in my opinion patient at baseline lacks capacity to make medical or social decisions regarding placement or finances, she needs a guardian to make decisions on her behalf in her best interest. Pt is declining and appears confused and more disorganized today.  [name] also unsteady and needs help with walking.  Labs shows high ammonia and valproic acid level.  Will decrease the dose of Depakote and add lactulose. PLAN: Safety and Monitoring:  -- Converted to IVC admission  -- Daily contact with patient to assess and evaluate symptoms and progress in treatment  -- Patient's case to be discussed in  multi-disciplinary team meeting  -- Observation Level : q15 minute checks  -- Vital signs:  q12 hours  -- Precautions: suicide, elopement, and assault, fall risk  -- Attempting off unit privileges and monitoring behavior  2. Psychiatric Diagnoses and Treatment:   Bipolar I MRE manic with psychotic features  -- Zyprexa was discontinued secondary to lack of efficacy  -Continue Haldol 10 mg BID for psychosis and mood stabilization, monitor  for SE.              -DC oral Invega  -Continue Cogentin 1 mg twice daily for EPS prophylaxis, helped with drooling and continue to monitor.             -Continue Invega Sustenna 234 mg IM given on 09-30-21.  Lipid Panel: WNL except for LDL 101; HbgA1c: 5.3; QTc:460ms   -- Skip tonight's Depakene dose.   -            -Decrease Depakene to 500 mg  twice daily from tomorrow morning.  Depakote level on 6/18-high at 136 Will need more meaningful discussion of psychoeducation about med and need for birth control as she clears CBC, LFT on 6/18 within normal limits   -- Ativan was discontinued few days ago to limit sedative agents.  -- Zyprexa/Geodon/ativan agitation protocol  -- Encouraged patient to participate in unit milieu and in scheduled group therapies     3. Medical Issues Being Addressed:   Mildly elevated creatinine   -- Last CMP shows mild increasing creatinine 1.19 and po hydration encouraged   Constipation  - continue colace 100mg  daily and encouraged PRN MOM   Season allergies  - continue Claritin 10mg  daily PRN             Urinary incontinence            -urinalysis-WNL  4. Discharge Planning:   -- Social work and case management to assist with discharge planning and identification of hospital follow-up needs prior to discharge  -- Discharge Concerns: Need to establish a safety plan; Medication compliance and effectiveness  -- Discharge Goals: Return home with outpatient referrals for mental health follow-up including medication  management/psychotherapy Patient is homeless with limited insurance coverage, we will plan discharging to homeless shelters with ACT team resources and follow-up. Referrals for ACT team have been placed by social work. Also recommending applying for disability and Medicaid to facilitate long-term placement which will be needed to avoid further decompensation and rehospitalization.  , MD PGY2 Psychiatry Resident

## 2021-10-07 NOTE — Progress Notes (Signed)
Nursing 1:1 note  D: Pt observed in her room. Urinated on herself. Staff assisted pt with changing her clothes. Staff changed pt's bed linen. Patient is confused. Attempting to walk but gait is unsteady. Gait belt used for safety. Non-slip yellow socks on patient.    A: 1:1 observation continued for patient safety.   R: Pt remains safe.

## 2021-10-08 DIAGNOSIS — F3112 Bipolar disorder, current episode manic without psychotic features, moderate: Secondary | ICD-10-CM | POA: Diagnosis not present

## 2021-10-08 NOTE — [facility] Group Notes (Signed)
Patient did not attend the Wrap-up group. 

## 2021-10-08 NOTE — Progress Notes (Signed)
Nursing 1:1 note D:Pt observed sleeping in bed with eyes closed. RR even and unlabored. No distress noted. A: 1:1 observation continues for safety  R: pt remains safe  

## 2021-10-08 NOTE — Plan of Care (Signed)
  Problem: Coping: Goal: Ability to verbalize frustrations and anger appropriately will improve Outcome: Progressing Goal: Ability to demonstrate self-control will improve Outcome: Progressing   Problem: Education: Goal: Ability to make informed decisions regarding treatment will improve Outcome: Progressing   Problem: Medication: Goal: Compliance with prescribed medication regimen will improve Outcome: Progressing

## 2021-10-08 NOTE — Progress Notes (Signed)
   10/08/21 0515  Sleep  Number of Hours 8    

## 2021-10-08 NOTE — Progress Notes (Addendum)
Pt had an episode of incontinence of urine, was cleaned up by staff and clothes / linen changed. Pt was too lethargic , so HS medication was held this evening.

## 2021-10-08 NOTE — Group Note (Signed)
LCSW Group Therapy Note  Group Date: 10/08/2021 Start Time: 1300 End Time: 1400   Type of Therapy and Topic:  Group Therapy - How To Cope with Nervousness about Discharge   Participation Level:  Did Not Attend   Description of Group This process group involved identification of patients' feelings about discharge. Some of them are scheduled to be discharged soon, while others are new admissions, but each of them was asked to share thoughts and feelings surrounding discharge from the hospital. One common theme was that they are excited at the prospect of going home, while another was that many of them are apprehensive about sharing why they were hospitalized. Patients were given the opportunity to discuss these feelings with their peers in preparation for discharge.  Therapeutic Goals  Patient will identify their overall feelings about pending discharge. Patient will think about how they might proactively address issues that they believe will once again arise once they get home (i.e. with parents). Patients will participate in discussion about having hope for change.   Summary of Patient Progress:  Did not attend  Therapeutic Modalities Cognitive Behavioral Therapy   Beatris Si, LCSW 10/08/2021  1:52 PM

## 2021-10-08 NOTE — Progress Notes (Signed)
Nursing 1:1 note: Patient is in the day room with the sitter. Patient is restless and do not want to stay still. No acute distress noted at this time, patient denies any pain or discomfort. Patient will continue to be on 1:1 observation, and staff will continue to provide support and reassurance to patient.

## 2021-10-08 NOTE — Group Note (Signed)
Recreation Therapy Group Note   Group Topic:Problem Solving  Group Date: 10/08/2021 Start Time: 1005 End Time: 1040 Facilitators: Caroll Rancher, LRT,CTRS Location: [address] Dayroom   Goal Area(s) Addresses:  Patient will effectively work with peer towards shared goal.  Patient will identify skills used to make activity successful.  Patient will share challenges and verbalize solution-driven approaches used. Patient will identify how skills used during activity can be used to reach post d/c goals.   Group Description:  [employer]. Patients were provided the following materials: 2 drinking straws, 5 rubber bands, 5 paper clips, 2 index cards and 2 drinking cups. Using the provided materials patients were asked to build a launching mechanism to launch a ping pong ball across the room, approximately 10 feet. Patients were divided into teams of 3-5. Instructions required all materials be incorporated into the device, functionality of items left to the peer group's discretion.   Affect/Mood: Flat   Participation Level: None   Participation Quality: N/A   Behavior: Drowsy   Speech/Thought Process: Disorganized   Insight: None   Judgement: None   Modes of Intervention: Problem-solving   Patient Response to Interventions:  Disengaged   Education Outcome:  Acknowledges education and In group clarification offered    Clinical Observations/Individualized Feedback: Pt appeared drowsy.  Pt was unable to participate due to drowsiness and comprehending the activity.  Pt sat and observed when not sleep.    Plan: Continue to engage patient in RT group sessions 2-3x/week.   Caroll Rancher, LRT,CTRS 10/08/2021 12:07 PM

## 2021-10-08 NOTE — Progress Notes (Signed)
Nursing 1:1 note D:Pt observed laying in bed with eyes open. RR even and unlabored. No distress noted. A: 1:1 observation continues for safety  R: pt remains safe  

## 2021-10-08 NOTE — Progress Notes (Signed)
Nursing 1:1 note: Patient is seen sitting quietly in the day room with no issue noted. No acute distress noted,  patient denies any pain or discomfort. Patient will continue to be on 1:1 observation, and staff will continue to provide support and reassurance to patient.

## 2021-10-08 NOTE — Progress Notes (Signed)
Patient is seen in the day room with sitter with no acute distress noted. No complaint of pain or any discomfort. Patient was a little upset when she was not allowed to go to the cafeteria. With a little redirection, patient was able to calm down and carry on with activities. Patient will continue to be on 1:1 observation. Staff will continue to provide support to patient.  10/08/21 0800  Psych Admission Type (Psych Patients Only)  Admission Status Involuntary  Psychosocial Assessment  Patient Complaints Anxiety;Confusion  Eye Contact Fair  Facial Expression Masked  Affect Depressed  Speech Slow  Interaction Cautious  Motor Activity Unsteady  Appearance/Hygiene Disheveled  Behavior Characteristics Cooperative  Mood Preoccupied;Apprehensive  Aggressive Behavior  Effect No apparent injury  Thought Process  Coherency Disorganized  Content Paranoia  Delusions Paranoid  Perception Depersonalization  Hallucination None reported or observed  Judgment Impaired  Confusion Mild  Danger to Self  Current suicidal ideation? Denies  Agreement Not to Harm Self Yes  Description of Agreement Verbal  Danger to Others  Danger to Others None reported or observed

## 2021-10-08 NOTE — Progress Notes (Addendum)
[facility] MD Progress Note  10/08/2021 12:52 PM Natalie Brown  MRN:  [id number]   Reason for Admission:  Natalie Brown is a 56 y.o. female with a history of bipolar d/o, who was initially admitted for inpatient psychiatric hospitalization on 09/06/2021 for management of delusions and disorganized thinking.  Chart Review from last 24 hours: The patient's chart was reviewed and nursing notes were reviewed.The patient's case was discussed in multidisciplinary team meeting. Per nursing, she has not been attending any groups.  Per MAR she was compliant with scheduled medications except flonase and Ensure yesterday and and cogentin today. She did not require as needed medications yesterday.  She required Ativan x2 yesterday for anxiety/agitation.NS reported that pt is still confused and still unsteady.  Information obtained today during patient  interview: Patient was found in the day room and appears better than yesterday. [name] still confused and delusional.  She denies any anxiety or depression.  She reports that she is feeling good and supposed to be discharged today. [name] delusional, states she is going to go to her mom's place and the judge "Caryn Section " told her that she can go home today. She reports that she slept different last night but not able to explain what different means for her. She reports stable appetite and continues to deny SI HI or AVH, does not appear overtly psychotic and paranoid. She does not present with any sign of EPS or drooling. She reports some diarrhea.  Discussed that it is likely due to lactulose.  Discussed that we will repeat her labs tomorrow morning.  She verbalizes understanding.  She denies any other physical symptoms. [name] oriented x 3 , knows current president, she states Trump was last president and currently he is in Senate which is not true.  She states Earl Lites or Elmon Kirschner was president before [employer] [name] not able to do DOWB. Principal Problem: Bipolar  affective disorder, current episode manic with psychotic symptoms (HCC)  Diagnosis: Principal Problem:   Bipolar affective disorder, current episode manic with psychotic symptoms (HCC) Active Problems:   Increased ammonia level   Valproic acid causing adverse effect in therapeutic use   Altered mental status  Past Psychiatric History: see H&P  Past Medical History:  Past Medical History:  Diagnosis Date   Bipolar 1 disorder with moderate mania (HCC) 12/30/2013   Hypertension    Family History: see H&P  Family Psychiatric  History: see H&P  Social History:  Social History   Substance and Sexual Activity  Alcohol Use No     Social History   Substance and Sexual Activity  Drug Use No    Social History   Socioeconomic History   Marital status: Married    Spouse name: Not on file   Number of children: Not on file   Years of education: Not on file   Highest education level: Not on file  Occupational History   Not on file  Tobacco Use   Smoking status: Every Day   Smokeless tobacco: Current  Substance and Sexual Activity   Alcohol use: No   Drug use: No   Sexual activity: Not on file  Other Topics Concern   Not on file  Social History Narrative   Not on file   Social Determinants of Health   Financial Resource Strain: Not on file  Food Insecurity: Not on file  Transportation Needs: Not on file  Physical Activity: Not on file  Stress: Not on file  Social Connections: Not on file   Sleep: Charting shows 8 hrs. Per Pt she slept different   Appetite:  Good  Current Medications: Current Facility-Administered Medications  Medication Dose Route Frequency Provider Last Rate Last Admin   alum & mag hydroxide-simeth (MAALOX/MYLANTA) 200-200-20 MG/5ML suspension 30 mL  30 mL Oral Q4H PRN Massengill, Harrold Donath, MD       benztropine (COGENTIN) tablet 1 mg  1 mg Oral BID PRN Comer Locket, MD   1 mg at 09/14/21 1254   benztropine (COGENTIN) tablet 1 mg  1 mg Oral  BID Abbott Pao, Nadir, MD   1 mg at 10/08/21 0834   benztropine mesylate (COGENTIN) injection 1 mg  1 mg Intramuscular BID PRN Comer Locket, MD       docusate sodium (COLACE) capsule 100 mg  100 mg Oral Daily Mason Jim, Amy E, MD   100 mg at 10/08/21 0834   feeding supplement (KATE FARMS STANDARD 1.4) liquid 325 mL  325 mL Oral BID BM Massengill, Harrold Donath, MD   325 mL at 10/08/21 0835   fluticasone (FLONASE) 50 MCG/ACT nasal spray 1 spray  1 spray Each Nare Daily Massengill, Harrold Donath, MD   1 spray at 10/05/21 0102   haloperidol (HALDOL) tablet 10 mg  10 mg Oral BID Sarita Bottom, MD   10 mg at 10/08/21 0834   hydrOXYzine (ATARAX) tablet 25 mg  25 mg Oral TID PRN Phineas Inches, MD   25 mg at 09/10/21 1204   lactulose (CHRONULAC) 10 GM/15ML solution 20 g  20 g Oral Daily Karsten Ro, MD   20 g at 10/08/21 0835   LORazepam (ATIVAN) tablet 1 mg  1 mg Oral Q6H PRN Comer Locket, MD   1 mg at 10/07/21 1546   magnesium hydroxide (MILK OF MAGNESIA) suspension 30 mL  30 mL Oral Daily PRN Massengill, Harrold Donath, MD       nicotine polacrilex (NICORETTE) gum 2 mg  2 mg Oral PRN Abbott Pao, Nadir, MD   2 mg at 09/22/21 0821   OLANZapine zydis (ZYPREXA) disintegrating tablet 5 mg  5 mg Oral Q8H PRN Comer Locket, MD   5 mg at 09/14/21 1255   And   ziprasidone (GEODON) injection 20 mg  20 mg Intramuscular PRN Comer Locket, MD       valproic acid (DEPAKENE) 250 MG/5ML solution 500 mg  500 mg Oral BID Karsten Ro, MD   500 mg at 10/08/21 7253   Lab Results:  Results for orders placed or performed during the hospital encounter of 09/06/21 (from the past 48 hour(s))  Urinalysis, Complete w Microscopic Urine, Clean Catch     Status: None   Collection Time: 10/06/21  1:55 PM  Result Value Ref Range   Color, Urine YELLOW YELLOW   APPearance CLEAR CLEAR   Specific Gravity, Urine 1.020 1.005 - 1.030   pH 7.0 5.0 - 8.0   Glucose, UA NEGATIVE NEGATIVE mg/dL   Hgb urine dipstick NEGATIVE NEGATIVE   Bilirubin  Urine NEGATIVE NEGATIVE   Ketones, ur NEGATIVE NEGATIVE mg/dL   Protein, ur NEGATIVE NEGATIVE mg/dL   Nitrite NEGATIVE NEGATIVE   Leukocytes,Ua NEGATIVE NEGATIVE   RBC / HPF 0-5 0 - 5 RBC/hpf   WBC, UA 0-5 0 - 5 WBC/hpf   Bacteria, UA NONE SEEN NONE SEEN   Squamous Epithelial / LPF 0-5 0 - 5    Comment: Performed at [facility], [address], [place], [place] [address]  CBC  Status: None   Collection Time: 10/07/21 10:14 AM  Result Value Ref Range   WBC 4.6 4.0 - 10.5 K/uL   RBC 4.13 3.87 - 5.11 MIL/uL   Hemoglobin 12.6 12.0 - 15.0 g/dL   HCT 95.3 20.2 - 33.4 %   MCV 93.0 80.0 - 100.0 fL   MCH 30.5 26.0 - 34.0 pg   MCHC 32.8 30.0 - 36.0 g/dL   RDW 35.6 86.1 - 68.3 %   Platelets 195 150 - 400 K/uL   nRBC 0.0 0.0 - 0.2 %    Comment: Performed at [facility], [address], [place], [place] [address]  Valproic acid level     Status: Abnormal   Collection Time: 10/07/21 10:14 AM  Result Value Ref Range   Valproic Acid Lvl 136 (H) 50.0 - 100.0 ug/mL    Comment: Performed at [facility], [address] [place], [place] [address]  Ammonia     Status: Abnormal   Collection Time: 10/07/21 10:14 AM  Result Value Ref Range   Ammonia 99 (H) 9 - 35 umol/L    Comment: Performed at [facility], [address], [place], [place] [address]  Basic metabolic panel     Status: Abnormal   Collection Time: 10/07/21 10:14 AM  Result Value Ref Range   Sodium 141 135 - 145 mmol/L   Potassium 4.6 3.5 - 5.1 mmol/L   Chloride 108 98 - 111 mmol/L   CO2 28 22 - 32 mmol/L   Glucose, Bld 79 70 - 99 mg/dL    Comment: Glucose reference range applies only to samples taken after fasting for at least 8 hours.   BUN 22 (H) 6 - 20 mg/dL   Creatinine, Ser 2.33 (H) 0.44 - 1.00 mg/dL   Calcium 8.6 (L) 8.9 - 10.3 mg/dL   GFR, Estimated 54 (L) >60 mL/min    Comment: (NOTE) Calculated using the CKD-EPI Creatinine Equation  (2021)    Anion gap 5 5 - 15    Comment: Performed at [facility], [address], [place], [place] [address]  Hepatic function panel     Status: Abnormal   Collection Time: 10/07/21 10:14 AM  Result Value Ref Range   Total Protein 6.6 6.5 - 8.1 g/dL   Albumin 3.3 (L) 3.5 - 5.0 g/dL   AST 17 15 - 41 U/L   ALT 9 0 - 44 U/L   Alkaline Phosphatase 45 38 - 126 U/L   Total Bilirubin 0.3 0.3 - 1.2 mg/dL   Bilirubin, Direct 0.1 0.0 - 0.2 mg/dL   Indirect Bilirubin 0.2 (L) 0.3 - 0.9 mg/dL    Comment: Performed at [facility], [address] [place], [place] [address]    Blood Alcohol level:  Lab Results  Component Value Date   ETH <10 09/05/2021   Metabolic Disorder Labs: Lab Results  Component Value Date   HGBA1C 5.3 09/08/2021   MPG 105.41 09/08/2021   No results found for: "PROLACTIN" Lab Results  Component Value Date   CHOL 173 09/08/2021   TRIG 110 09/08/2021   HDL 50 09/08/2021   CHOLHDL 3.5 09/08/2021   VLDL 22 09/08/2021   LDLCALC 101 (H) 09/08/2021   LDLCALC 102 (H) 06/21/2020   Physical Findings:  Musculoskeletal: Strength & Muscle Tone: within normal limits Gait & Station: normal Patient leans: N/A  Psychiatric Specialty Exam: Psychiatric Specialty Exam: Physical Exam Vitals and nursing note reviewed.  Constitutional:  General: [name] not in acute distress.    Appearance: [name] not ill-appearing, toxic-appearing or diaphoretic.  HENT:     Head: Normocephalic.  Pulmonary:     Effort: Pulmonary effort is normal.  Neurological:     Mental Status: [name] alert and oriented to person, place, and time.     Comments: Somewhat confused.      Review of Systems  Constitutional: Negative.  Negative for fever.  Respiratory: Negative.  Negative for shortness of breath.   Cardiovascular: Negative.  Negative for chest pain.  Gastrointestinal:  Negative for diarrhea, nausea and vomiting.  Genitourinary:         Urinary incontinence  Neurological:  Negative for tremors and headaches.  Psychiatric/Behavioral:  Negative for hallucinations and suicidal ideas. The patient is not nervous/anxious.     Blood pressure 111/89, pulse 71, temperature 98.7 F (37.1 C), temperature source Oral, resp. rate 16, height 5\' 6"  (1.676 m), weight 72.6 kg, SpO2 98 %.Body mass index is 25.83 kg/m.  General Appearance: appropriate for environment  Eye Contact:  Fair  Speech:  Normal Rate,   Volume: Within normal limits  Mood: Euthymic, not depressed or euphoric  Affect:  Appropriate and Congruent  Thought Process:  Disorganized and Descriptions of Associations: Tangential  Orientation:  Full (Time, Place, and Person)  Thought Content: Disorganized at times but no paranoia or other delusions noted  Suicidal Thoughts:  No  Homicidal Thoughts:  No  Memory:  Immediate;   Poor Recent;   Poor Remote;   Poor  Judgement:  Impaired  Insight:   poor  Psychomotor Activity:  Normal  Concentration:  Concentration: Fair and Attention Span: Fair  Recall:  of Knowledge:  Poor  Language:  Good  Akathisia:  Negative  Handed:  Ambidextrous  AIMS (if indicated):     Assets:  Communication Skills Desire for Improvement Physical Health  ADL's:  Impaired  Cognition:  Impaired,  Mild  Sleep:  Number of Hours: 8   Blood pressure 111/89, pulse 71, temperature 98.7 F (37.1 C), temperature source Oral, resp. rate 16, height 5\' 6"  (1.676 m), weight 72.6 kg, SpO2 98 %. Body mass index is 25.83 kg/m.  Treatment Plan Summary: Diagnoses / Active Problems: Bipolar I MRE manic severe with psychotic features  During this hospitalization patient made significant improvement regarding her mood and psychosis was no further mania or paranoia noted despite medication treatment she continues to have  poor insight and judgment regarding her mental health needs and need for follow-up after discharge and need to establish safe  residence to limit decompensation. Given today's evaluation and the earlier evaluations during this hospital stay, in my opinion patient at baseline lacks capacity to make medical or social decisions regarding placement or finances, she needs a guardian to make decisions on her behalf in her best interest. Pt appears less confused and disorganized than yesterday but still delusional.  [name] also unsteady and needs help with walking.  Valproic acid level and ammonia was high yesterday.  Depakote dose was decreased and lactulose was added .  We will repeat valproic acid level, ammonia and CMP tomorrow morning.  PLAN: Safety and Monitoring:  -- Converted to IVC admission  -- Daily contact with patient to assess and evaluate symptoms and progress in treatment  -- Patient's case to be discussed in multi-disciplinary team meeting  -- Observation Level : q15 minute checks  -- Vital signs:  q12 hours  -- Precautions: suicide, elopement,  and assault, fall risk  -- Attempting off unit privileges and monitoring behavior  2. Psychiatric Diagnoses and Treatment:   Bipolar I MRE manic with psychotic features  -- Zyprexa was discontinued secondary to lack of efficacy  -Continue Haldol 10 mg BID for psychosis and mood stabilization, monitor  for SE.              -DC oral Invega  -Continue Cogentin 1 mg twice daily for EPS prophylaxis, helped with drooling and continue to monitor.             Hinda Glatter-Invega Sustenna 234 mg IM given on 09-30-21.  Lipid Panel: WNL except for LDL 101; HbgA1c: 5.3; QTc:4911ms              -Continue Depakene to 500 mg  twice daily.  (Decreased on 6/18 due to high VPA level and pneumonia) Depakote level on 6/18-high at 136, Ammonia 99 Will need more meaningful discussion of psychoeducation about med and need for birth control as she clears CBC, LFT on 6/18 within normal limits   -- Ativan discontinued to limit sedative agents.  -- Zyprexa/Geodon/ativan agitation protocol  --  Encouraged patient to participate in unit milieu and in scheduled group therapies     3. Medical Issues Being Addressed:               Hyperammonemia            -Continue lactulose 20 g daily.             -Repeat ammonia level tomorrow morning, Checking CMP and VPA level tomorrow.   Mildly elevated creatinine   -- Last CMP shows mild increasing creatinine 1.19 and po hydration encouraged   Constipation  - continue colace 100mg  daily and encouraged PRN MOM   Season allergies  - continue Claritin 10mg  daily PRN             Urinary incontinence            -urinalysis-WNL  4. Discharge Planning:   -- Social work and case management to assist with discharge planning and identification of hospital follow-up needs prior to discharge  -- Discharge Concerns: Need to establish a safety plan; Medication compliance and effectiveness  -- Discharge Goals: Return home with outpatient referrals for mental health follow-up including medication management/psychotherapy Patient is homeless with limited insurance coverage, we will plan discharging to homeless shelters with ACT team resources and follow-up. Referrals for ACT team have been placed by social work. Also recommending applying for disability and Medicaid to facilitate long-term placement which will be needed to avoid further decompensation and rehospitalization.  Karsten RoVandana  Arvella Massingale, MD PGY2 Psychiatry Resident

## 2021-10-08 NOTE — Progress Notes (Signed)
Pt appeared less confused at times this evening, pt appeared more steady with her gait, pt walked to the med room to take her medications this evening. Pt able to carry on logical conversation  some this evening.     10/08/21 2115  Psych Admission Type (Psych Patients Only)  Admission Status Involuntary  Psychosocial Assessment  Patient Complaints Anxiety;Confusion  Eye Contact Fair  Facial Expression Flat  Affect Depressed  Speech Slurred  Interaction Cautious  Motor Activity Unsteady  Appearance/Hygiene Disheveled  Behavior Characteristics Cooperative  Mood Preoccupied;Pleasant  Aggressive Behavior  Effect No apparent injury  Thought Process  Coherency Disorganized  Content Paranoia  Delusions Paranoid  Perception Depersonalization  Hallucination None reported or observed  Judgment Impaired  Confusion Mild  Danger to Self  Current suicidal ideation? Denies  Danger to Others  Danger to Others None reported or observed

## 2021-10-08 NOTE — Progress Notes (Signed)
[facility] LCSW Progress Note   10/08/2021    2:22 PM  Natalie Brown   [id number]   Type of Contact and Topic:  Care Coordination   CSW spoke with mother who reports she intends on filing for guardianship later in the week. She has secured the funds to file the paperwork and needs time to get help filling required paperwork. Situation ongoing, CSW will continue to monitor and update note as more information becomes available. CSW will check in with mother later this week to get update.    Concurrently, CSW attempted to reach [name] at [phone], Supervisor to [name], Supervisor to [name].  CSW will await call back from DSS, should writer not receive call from [name], CSW will reach out to his supervisor [name] at [phone].     Signed:  [name], MSW, LCSWA, LCAS 10/08/2021 2:22 PM

## 2021-10-09 ENCOUNTER — Inpatient Hospital Stay (HOSPITAL_COMMUNITY): Payer: Self-pay

## 2021-10-09 DIAGNOSIS — F312 Bipolar disorder, current episode manic severe with psychotic features: Principal | ICD-10-CM

## 2021-10-09 LAB — RAPID HIV SCREEN (HIV 1/2 AB+AG)
HIV 1/2 Antibodies: NONREACTIVE
HIV-1 P24 Antigen - HIV24: NONREACTIVE

## 2021-10-09 LAB — COMPREHENSIVE METABOLIC PANEL
ALT: 11 U/L (ref 0–44)
AST: 20 U/L (ref 15–41)
Albumin: 3.4 g/dL — ABNORMAL LOW (ref 3.5–5.0)
Alkaline Phosphatase: 52 U/L (ref 38–126)
Anion gap: 8 (ref 5–15)
BUN: 23 mg/dL — ABNORMAL HIGH (ref 6–20)
CO2: 25 mmol/L (ref 22–32)
Calcium: 8.3 mg/dL — ABNORMAL LOW (ref 8.9–10.3)
Chloride: 109 mmol/L (ref 98–111)
Creatinine, Ser: 1.35 mg/dL — ABNORMAL HIGH (ref 0.44–1.00)
GFR, Estimated: 47 mL/min — ABNORMAL LOW (ref >60)
Glucose, Bld: 104 mg/dL — ABNORMAL HIGH (ref 70–99)
Potassium: 4.5 mmol/L (ref 3.5–5.1)
Sodium: 142 mmol/L (ref 135–145)
Total Bilirubin: 0.4 mg/dL (ref 0.3–1.2)
Total Protein: 7.2 g/dL (ref 6.5–8.1)

## 2021-10-09 LAB — AMMONIA: Ammonia: 48 umol/L — ABNORMAL HIGH (ref 9–35)

## 2021-10-09 LAB — SEDIMENTATION RATE: Sed Rate: 6 mm/hr (ref 0–22)

## 2021-10-09 LAB — VALPROIC ACID LEVEL: Valproic Acid Lvl: 25 ug/mL — ABNORMAL LOW (ref 50.0–100.0)

## 2021-10-09 MED ORDER — VALPROIC ACID 250 MG/5ML PO SOLN
250.0000 mg | Freq: Two times a day (BID) | ORAL | Status: AC
  Administered 2021-10-10 (×2): 250 mg via ORAL
  Filled 2021-10-09 (×3): qty 5

## 2021-10-09 MED ORDER — BENZTROPINE MESYLATE 0.5 MG PO TABS
0.5000 mg | ORAL_TABLET | Freq: Two times a day (BID) | ORAL | Status: DC
  Administered 2021-10-09 – 2021-11-20 (×83): 0.5 mg via ORAL
  Filled 2021-10-09 (×87): qty 1

## 2021-10-09 NOTE — Progress Notes (Signed)
   10/09/21 0545  Sleep  Number of Hours 8

## 2021-10-09 NOTE — Progress Notes (Signed)
Nursing 1:1 note D:Pt observed sleeping in bed with eyes closed. RR even and unlabored. No distress noted. A: 1:1 observation continues for safety  R: pt remains safe  

## 2021-10-09 NOTE — Plan of Care (Signed)
  Problem: Coping: Goal: Ability to verbalize frustrations and anger appropriately will improve Outcome: Progressing Goal: Ability to demonstrate self-control will improve Outcome: Progressing   

## 2021-10-09 NOTE — Progress Notes (Signed)
   10/09/21 2015  Psych Admission Type (Psych Patients Only)  Admission Status Involuntary  Psychosocial Assessment  Patient Complaints Anxiety;Confusion  Eye Contact Fair  Facial Expression Flat  Affect Depressed  Speech Slurred  Interaction Cautious  Motor Activity Unsteady  Appearance/Hygiene Disheveled  Behavior Characteristics Cooperative  Mood Sad  Aggressive Behavior  Effect No apparent injury  Thought Process  Coherency Disorganized  Content Paranoia  Delusions Paranoid  Perception Depersonalization  Hallucination None reported or observed  Judgment Impaired  Confusion Mild  Danger to Self  Current suicidal ideation? Denies  Danger to Others  Danger to Others None reported or observed

## 2021-10-09 NOTE — Progress Notes (Signed)
Patient is the room at this time, and still under 1:1 observation. No acute distress noted, patient denies any pain or discomfort. Patient tolerated all scheduled medication well with no side effect noted. Patient will continue on 1;1 observation. Staff will continue to provide support and reassurance to patient.

## 2021-10-09 NOTE — Progress Notes (Signed)
Nursing 1:1 note D:Pt observed sitting on  bed talking to sitter. RR even and unlabored. No distress noted. A: 1:1 observation continues for safety  R: pt remains safe

## 2021-10-09 NOTE — Progress Notes (Signed)
Pt seen walking up the hall and ambulating without staff and no gait belt, pt gait steady, pt not experiencing issues with walking and ambulating. Pt status changed to Close Observation while awake.

## 2021-10-09 NOTE — Progress Notes (Addendum)
[facility] MD Progress Note  10/09/2021 8:42 AM Natalie Brown  MRN:  [id number]   Reason for Admission:  Natalie Brown is a 56 y.o. female with a history of bipolar d/o, who was initially admitted for inpatient psychiatric hospitalization on 09/06/2021 for management of delusions and disorganized thinking.  Chart Review from last 24 hours: The patient's chart was reviewed and nursing notes were reviewed.The patient's case was discussed in multidisciplinary team meeting. Per nursing, she has not been attending any groups.  Per [facility] she was compliant with scheduled medications except cogentin yesterday. She did not require as needed medications yesterday. NS reported that pt is less confused and more steady.   Information obtained today during patient  interview: Patient was seen in her room.  She reports that she is feeling "good "and denies any anxiety or depression. [name] delusional, and states  judge "Hassell Done " told her that she can go home and it was on TV.  Discussed that her hearing for IVC is postponed.  Discussed that we are still tapering her medications and ordering additional labs and CT head.  She reports that she slept well last night and reports stable appetite.  She continues to deny SI HI or AVH, and denies paranoia, ideas of reference or first rank symptoms.  [name] still delusional and states she knows multiple languages including [place], [place], and [place] and has 6 licenses including ones for "diabetic nutrition." She does not present with any sign of EPS or drooling. She denies any physical symptoms except some lightheadedness.[name] oriented to month and year but not to date.  Knows she is in [place]. She continues to lack insight into the reason for her admission and is ruminative about desire to go to cafeteria.   Principal Problem: Bipolar affective disorder, current episode manic with psychotic symptoms ([place])  Diagnosis: Principal Problem:   Bipolar affective disorder,  current episode manic with psychotic symptoms ([place]) Active Problems:   Increased ammonia level   Valproic acid causing adverse effect in therapeutic use   Altered mental status  Past Psychiatric History: see H&P  Past Medical History:  Past Medical History:  Diagnosis Date   Bipolar 1 disorder with moderate mania ([place]) 12/30/2013   Hypertension    Family History: see H&P  Family Psychiatric  History: see H&P  Social History:  Social History   Substance and Sexual Activity  Alcohol Use No     Social History   Substance and Sexual Activity  Drug Use No    Social History   Socioeconomic History   Marital status: Married    Spouse name: Not on file   Number of children: Not on file   Years of education: Not on file   Highest education level: Not on file  Occupational History   Not on file  Tobacco Use   Smoking status: Every Day   Smokeless tobacco: Current  Substance and Sexual Activity   Alcohol use: No   Drug use: No   Sexual activity: Not on file  Other Topics Concern   Not on file  Social History Narrative   Not on file   Social Determinants of Health   Financial Resource Strain: Not on file  Food Insecurity: Not on file  Transportation Needs: Not on file  Physical Activity: Not on file  Stress: Not on file  Social Connections: Not on file   Sleep: Good  Appetite:  Good  Current Medications: Current Facility-Administered Medications  Medication Dose  Route Frequency Provider Last Rate Last Admin   alum & mag hydroxide-simeth (MAALOX/MYLANTA) 200-200-20 MG/5ML suspension 30 mL  30 mL Oral Q4H PRN Massengill, [name], MD       benztropine (COGENTIN) tablet 1 mg  1 mg Oral BID PRN Harlow Asa, MD   1 mg at 09/14/21 1254   benztropine (COGENTIN) tablet 1 mg  1 mg Oral BID Winfred Leeds, Nadir, MD   1 mg at 10/09/21 2836   benztropine mesylate (COGENTIN) injection 1 mg  1 mg Intramuscular BID PRN Harlow Asa, MD       docusate sodium (COLACE)  capsule 100 mg  100 mg Oral Daily Nelda Marseille, Geramy Lamorte E, MD   100 mg at 10/09/21 6294   feeding supplement (KATE FARMS STANDARD 1.4) liquid 325 mL  325 mL Oral BID BM Massengill, [name], MD   325 mL at 10/09/21 0823   fluticasone (FLONASE) 50 MCG/ACT nasal spray 1 spray  1 spray Each Nare Daily Massengill, [name], MD   1 spray at 10/09/21 7654   haloperidol (HALDOL) tablet 10 mg  10 mg Oral BID Dian Situ, MD   10 mg at 10/09/21 0813   hydrOXYzine (ATARAX) tablet 25 mg  25 mg Oral TID PRN Janine Limbo, MD   25 mg at 09/10/21 1204   lactulose (CHRONULAC) 10 GM/15ML solution 20 g  20 g Oral Daily Armando Reichert, MD   20 g at 10/09/21 0814   LORazepam (ATIVAN) tablet 1 mg  1 mg Oral Q6H PRN Harlow Asa, MD   1 mg at 10/07/21 1546   magnesium hydroxide (MILK OF MAGNESIA) suspension 30 mL  30 mL Oral Daily PRN Massengill, [name], MD       nicotine polacrilex (NICORETTE) gum 2 mg  2 mg Oral PRN Winfred Leeds, Nadir, MD   2 mg at 09/22/21 0821   OLANZapine zydis (ZYPREXA) disintegrating tablet 5 mg  5 mg Oral Q8H PRN Harlow Asa, MD   5 mg at 09/14/21 1255   And   ziprasidone (GEODON) injection 20 mg  20 mg Intramuscular PRN Harlow Asa, MD       valproic acid (DEPAKENE) 250 MG/5ML solution 500 mg  500 mg Oral BID Armando Reichert, MD   500 mg at 10/09/21 6503   Lab Results:  Results for orders placed or performed during the hospital encounter of 09/06/21 (from the past 48 hour(s))  CBC     Status: None   Collection Time: 10/07/21 10:14 AM  Result Value Ref Range   WBC 4.6 4.0 - 10.5 K/uL   RBC 4.13 3.87 - 5.11 MIL/uL   Hemoglobin 12.6 12.0 - 15.0 g/dL   HCT 38.4 36.0 - 46.0 %   MCV 93.0 80.0 - 100.0 fL   MCH 30.5 26.0 - 34.0 pg   MCHC 32.8 30.0 - 36.0 g/dL   RDW 13.3 11.5 - 15.5 %   Platelets 195 150 - 400 K/uL   nRBC 0.0 0.0 - 0.2 %    Comment: Performed at [facility], [place] [address] [place], [place] [address]  Valproic acid level     Status: Abnormal    Collection Time: 10/07/21 10:14 AM  Result Value Ref Range   Valproic Acid Lvl 136 (H) 50.0 - 100.0 ug/mL    Comment: Performed at [facility], [place] [address] [place], [place] [address]  Ammonia     Status: Abnormal   Collection Time: 10/07/21 10:14 AM  Result Value Ref  Range   Ammonia 99 (H) 9 - 35 umol/L    Comment: Performed at [facility], [place] [address], [place], [place] [address]  Basic metabolic panel     Status: Abnormal   Collection Time: 10/07/21 10:14 AM  Result Value Ref Range   Sodium 141 135 - 145 mmol/L   Potassium 4.6 3.5 - 5.1 mmol/L   Chloride 108 98 - 111 mmol/L   CO2 28 22 - 32 mmol/L   Glucose, Bld 79 70 - 99 mg/dL    Comment: Glucose reference range applies only to samples taken after fasting for at least 8 hours.   BUN 22 (H) 6 - 20 mg/dL   Creatinine, Ser 1.19 (H) 0.44 - 1.00 mg/dL   Calcium 8.6 (L) 8.9 - 10.3 mg/dL   GFR, Estimated 54 (L) >60 mL/min    Comment: (NOTE) Calculated using the CKD-EPI Creatinine Equation (2021)    Anion gap 5 5 - 15    Comment: Performed at [facility], [place] [address], [place], [place] [address]  Hepatic function panel     Status: Abnormal   Collection Time: 10/07/21 10:14 AM  Result Value Ref Range   Total Protein 6.6 6.5 - 8.1 g/dL   Albumin 3.3 (L) 3.5 - 5.0 g/dL   AST 17 15 - 41 U/L   ALT 9 0 - 44 U/L   Alkaline Phosphatase 45 38 - 126 U/L   Total Bilirubin 0.3 0.3 - 1.2 mg/dL   Bilirubin, Direct 0.1 0.0 - 0.2 mg/dL   Indirect Bilirubin 0.2 (L) 0.3 - 0.9 mg/dL    Comment: Performed at [facility], [place] [address] [place], [place] [address]    Blood Alcohol level:  Lab Results  Component Value Date   ETH <10 27/74/1287   Metabolic Disorder Labs: Lab Results  Component Value Date   HGBA1C 5.3 09/08/2021   MPG 105.41 09/08/2021   No results found for: "PROLACTIN" Lab Results  Component Value Date   CHOL 173 09/08/2021   TRIG  110 09/08/2021   HDL 50 09/08/2021   CHOLHDL 3.5 09/08/2021   VLDL 22 09/08/2021   LDLCALC 101 (H) 09/08/2021   LDLCALC 102 (H) 06/21/2020   Physical Findings:  Musculoskeletal: Strength & Muscle Tone: within normal limits Gait & Station: normal Patient leans: N/A  Psychiatric Specialty Exam: Psychiatric Specialty Exam: Physical Exam Vitals and nursing note reviewed.  Constitutional:      General: [name] not in acute distress.    Appearance: [name] not ill-appearing, toxic-appearing or diaphoretic.  HENT:     Head: Normocephalic.  Pulmonary:     Effort: Pulmonary effort is normal.  Neurological:     Mental Status: [name] alert.     Review of Systems  Constitutional: Negative.  Negative for fever.  Respiratory: Negative.  Negative for shortness of breath.   Cardiovascular: Negative.  Negative for chest pain.  Gastrointestinal:  Negative for diarrhea, nausea and vomiting.  Genitourinary:        Urinary incontinence  Neurological:  Positive for light-headedness. Negative for tremors and headaches.  Psychiatric/Behavioral:  Negative for hallucinations and suicidal ideas. The patient is not nervous/anxious.     Blood pressure 110/75, pulse 95, temperature 98.1 F (36.7 C), temperature source Oral, resp. rate 16, height 5' 6" (1.676 m), weight 72.6 kg, SpO2 94 %.Body mass index is 25.83 kg/m.  General Appearance: appropriate for environment  Eye Contact:  Fair  Speech:  Normal  Rate - not speaking in foreign accent today  Volume: Within normal limits  Mood: Mildly Anxious appearing  Affect:  mildly anxious  Thought Process:  Ruminative about desire to go to cafeteria and on discharge planning  Orientation:  Oriented to city, month and year not date or situation  Thought Content: Delusional that she speaks multiple languages and has multiple degrees; is not grossly responding to internal/external stimuli on exam; denies paranoia, ideas of reference, first rank sx, SI, HI or  AVH  Suicidal Thoughts:  No  Homicidal Thoughts:  No  Memory: Limited  Judgement:  Impaired  Insight:   poor  Psychomotor Activity:  Normal  Concentration:  Concentration: Fair and Attention Span: Fair  Recall:  Limited  Fund of Knowledge:  Poor  Language:  Good  Akathisia:  Negative  Handed:  Ambidextrous  AIMS (if indicated):     Assets:  Communication Skills Desire for Improvement Physical Health  ADL's:  Impaired  Cognition:  Impaired,  Mild  Sleep:  Number of Hours: 8   Blood pressure 110/75, pulse 95, temperature 98.1 F (36.7 C), temperature source Oral, resp. rate 16, height 5' 6" (1.676 m), weight 72.6 kg, SpO2 94 %. Body mass index is 25.83 kg/m.  Treatment Plan Summary: Diagnoses / Active Problems: Bipolar I MRE manic severe with psychotic features  She continues to have  poor insight and judgment regarding her mental health needs and need for follow-up after discharge and need to establish safe residence to limit decompensation.Given today's evaluation and the earlier evaluations during this hospital stay, in my opinion patient at baseline lacks capacity to make medical or social decisions regarding placement or finances, and would benefit from a guardian to make decisions on her behalf in her best interest. She appears less delirious today and we will proceed with tapering off VPA and doing additional psychosis w/u given residual symptoms.    PLAN: Safety and Monitoring:  -- Converted to IVC admission  -- Daily contact with patient to assess and evaluate symptoms and progress in treatment  -- Patient's case to be discussed in multi-disciplinary team meeting  -- Observation Level : q15 minute checks  -- Vital signs:  q12 hours  -- Precautions: suicide, elopement, and assault, fall risk  -- Attempting off unit privileges and monitoring behavior but continuing 1:1 for fall risk   2. Psychiatric Diagnoses and Treatment:   Bipolar I MRE manic with psychotic  features  -- Zyprexa was discontinued secondary to lack of efficacy  -Continue Haldol 10 mg BID for psychosis and mood stabilization  -Decrease Cogentin to 0.5 mg twice daily in event anticholinergic med has contributed to recent confusion             -Invega Sustenna 234 mg IM given on 09-30-21. Holding on giving  2nd loading Invega sustenna 137m IM dose at this time due to recent confusion and need for multiple medication presently Lipid Panel: WNL except for LDL 101; HbgA1c: 5.3; QTc:4112m             -Decrease Depakene to 250 mg  twice daily starting tomorrow and then stop - tapering off VPA given recent hyperammonemia - Depakote level on 6/18-high at 136, Ammonia 99; CBC, LFT on 6/18 within normal limits - repeat CMP, Ammonia level and VPA level pending  -- Ativan discontinued to limit sedative agents.  -- Zyprexa/Geodon/ativan agitation protocol  -- Encouraged patient to participate in unit milieu and in scheduled group therapies             -  Ordering additional psychosis labs (HIV, RPR, ANA, heavy metal, ESR, ceruloplasmin) and CT head today    3. Medical Issues Being Addressed:               Hyperammonemia            -Continue lactulose 20 g daily.             -Repeating ammonia level, CMP and VPA level today.   - Tapering off VPA   Mildly elevated creatinine   -- Last CMP shows mild increasing creatinine 1.19 and po hydration encouraged   Constipation  - continue colace 155m daily and encouraged PRN MOM   Season allergies  - continue Claritin 168mdaily PRN              Urinary incontinence            -urinalysis-WNL  4. Discharge Planning:   -- Social work and case management to assist with discharge planning and identification of hospital follow-up needs prior to discharge  -- Discharge Concerns: Need to establish a safety plan; Medication compliance and effectiveness  -- Discharge Goals: Return home with outpatient referrals for mental health follow-up including  medication management/psychotherapy Patient is homeless with limited insurance coverage, we will plan discharging to homeless shelters with ACT team resources and follow-up. Referrals for ACT team have been placed by social work. Also recommending applying for disability and Medicaid to facilitate long-term placement which will be needed to avoid further decompensation and rehospitalization.  Mom is applying for guardianship later this week. CSW following.   VaArmando ReichertMD PGY2 Psychiatry Resident

## 2021-10-09 NOTE — Progress Notes (Signed)
After much encouragement pt agreed to take labs, but they were gone, but the labs were rescheduled for this evening and pt agreed to get them done

## 2021-10-09 NOTE — Progress Notes (Signed)
Pt refused labs, writer tried to explain the concept of the Depakote and ammonia levels being high and having to monitor, but pt could not comprehend the concept. Pt stated at another place they only took labs 1 time.

## 2021-10-09 NOTE — Progress Notes (Signed)
Patient in room with staff at this time. No acute distress noted at this time. Staff will continue to monitor.

## 2021-10-09 NOTE — Progress Notes (Signed)
   10/09/21 0800  Psych Admission Type (Psych Patients Only)  Admission Status Involuntary  Psychosocial Assessment  Patient Complaints Anxiety;Confusion  Eye Contact Fair  Facial Expression Flat  Affect Depressed  Speech Slurred;Slow  Interaction Cautious  Motor Activity Unsteady  Appearance/Hygiene Disheveled  Behavior Characteristics Cooperative  Mood Sad  Thought Process  Coherency Disorganized;Flight of ideas  Content Paranoia  Delusions Paranoid  Perception Depersonalization  Hallucination None reported or observed  Judgment Impaired  Confusion Moderate  Danger to Self  Agreement Not to Harm Self Yes  Description of Agreement Verbal  Danger to Others  Danger to Others None reported or observed

## 2021-10-09 NOTE — Group Note (Signed)
Recreation Therapy Group Note   Group Topic:Healthy Decision Making  Group Date: 10/09/2021 Start Time: 1004 End Time: 1050 Facilitators: Caroll Rancher, LRT,CTRS Location: [address] Dayroom  Goal Area(s) Addresses:  Patient will effectively work with peer towards shared goal.  Patient will identify factors that guided their decision making.  Patient will pro-socially communicate ideas during group session.    Group Description:  Patients were given a scenario that they were going to be stranded on a deserted [place] for several months before being rescued. Writer tasked them with making a list of 15 things they would choose to bring with them for "survival". The list of items was prioritized most important to least. Each patient would come up with their own list, then work together to create a new list of 15 items while in a group of 3-5 peers. LRT discussed each person's list and how it differed from others. The debrief included discussion of priorities, good decisions versus bad decisions, and how it is important to think before acting so we can make the best decision possible. LRT tied the concept of effective communication among group members to patient's support systems outside of the hospital and its benefit post discharge.    Affect/Mood: N/A   Participation Level: Did not attend    Clinical Observations/Individualized Feedback:      Plan: Continue to engage patient in RT group sessions 2-3x/week.   Caroll Rancher, LRT,CTRS 10/09/2021 11:11 AM

## 2021-10-10 ENCOUNTER — Encounter (HOSPITAL_COMMUNITY): Payer: Self-pay

## 2021-10-10 DIAGNOSIS — F312 Bipolar disorder, current episode manic severe with psychotic features: Secondary | ICD-10-CM | POA: Diagnosis not present

## 2021-10-10 LAB — RPR: RPR Ser Ql: NONREACTIVE

## 2021-10-10 LAB — ANA W/REFLEX IF POSITIVE: Anti Nuclear Antibody (ANA): NEGATIVE

## 2021-10-10 LAB — CERULOPLASMIN: Ceruloplasmin: 24.2 mg/dL (ref 19.0–39.0)

## 2021-10-10 MED ORDER — LACTULOSE 10 GM/15ML PO SOLN
10.0000 g | Freq: Once | ORAL | Status: AC
  Administered 2021-10-10: 10 g via ORAL
  Filled 2021-10-10: qty 15

## 2021-10-10 NOTE — Progress Notes (Signed)
   10/10/21 1100  Psych Admission Type (Psych Patients Only)  Admission Status Involuntary  Psychosocial Assessment  Patient Complaints Anxiety;Confusion  Eye Contact Fair  Facial Expression Flat  Affect Depressed  Speech Slurred  Interaction Cautious  Motor Activity Unsteady  Appearance/Hygiene Disheveled  Behavior Characteristics Cooperative  Mood Anxious;Pleasant  Aggressive Behavior  Effect No apparent injury  Thought Process  Coherency Disorganized  Content Paranoia  Delusions Paranoid  Perception Depersonalization  Hallucination None reported or observed  Judgment Impaired  Confusion Mild  Danger to Self  Current suicidal ideation? Denies  Agreement Not to Harm Self Yes  Description of Agreement Verbal  Danger to Others  Danger to Others None reported or observed

## 2021-10-10 NOTE — Progress Notes (Signed)
   10/10/21 0530  Sleep  Number of Hours 8.5

## 2021-10-10 NOTE — Group Note (Signed)
Recreation Therapy Group Note   Group Topic:Leisure Education  Group Date: 10/10/2021 Start Time: 1008 End Time: 1045 Facilitators: Caroll Rancher, LRT,CTRS Location: [address] Dayroom   Goal Area(s) Addresses:  Patient will successfully identify positive leisure and recreation activities.  Patient will acknowledge benefits of participation in healthy leisure activities post discharge.  Patient will actively work with peers toward a shared goal.   Group Description:  Pictionary. In groups of 5-7, patients took turns trying to guess the picture being drawn on the board by their teammate.  If the team guessed the correct answer, they won a point.  If the team guessed wrong, the other team got a chance to steal the point. After several rounds of game play, the team with the most points were declared winners. Post-activity discussion reviewed benefits of positive recreation outlets: reducing stress, improving coping mechanisms, increasing self-esteem, and building larger support systems.   Affect/Mood: Appropriate   Participation Level: Moderate   Participation Quality: Independent   Behavior: Appropriate   Speech/Thought Process: Focused   Insight: Moderate   Judgement: Moderate   Modes of Intervention: Game   Patient Response to Interventions:  Engaged   Education Outcome:  Acknowledges education and In group clarification offered    Clinical Observations/Individualized Feedback:  Pt was quiet but engaged when prompted.  Pt was able to complete one drawing for her team to guess.  Pt engaged well with peers and was able to follow along with the activity.  Pt identified some of her leisure activities as going places with her boyfriend and walking.    Plan: Continue to engage patient in RT group sessions 2-3x/week.   Caroll Rancher, LRT,CTRS 10/10/2021 1:32 PM

## 2021-10-10 NOTE — Progress Notes (Signed)
Nursing Close Observation note D:Pt observed sleeping in bed with eyes closed. RR even and unlabored. No distress noted. A: 1:1 observation continues for safety  R: pt remains safe

## 2021-10-10 NOTE — Progress Notes (Signed)
[facility] Post 1:1 Observation Documentation  For the first (8) hours following discontinuation of 1:1 precautions, a progress note entry by nursing staff should be documented at least every 2 hours, reflecting the patient's behavior, condition, mood, and conversation.  Use the progress notes for additional entries.  Time 1:1 discontinued:  0922  Patient's Behavior: Restless   Patient's Condition: confused "I will come back after I close my storage door".   Patient's Conversation:  "Let me go to my storage and come back". Required multiple verbal redirections.   Sherryl Manges 10/10/2021, [phone]

## 2021-10-10 NOTE — Progress Notes (Addendum)
ADDENDUM   [place] DSS will not be pursuing guardianship at this time. Patient has been referred to [facility], placement social worker [phone], for further coordination of services. CSW will reach out to DSS social worker to discuss placement options.   Signed:  [name], MSW, [place], LCAS 10/10/2021 4:17 PM  [facility] LCSW Progress Note   10/10/2021    3:26 PM  Natalie Brown   [phone]   Type of Contact and Topic:    Clinical review completed with Darryl Lent, [phone], for Natalie Brown. DSS guardianship, currently report is being reviewed. Situation ongoing, CSW will continue to monitor and update note as more information becomes available. CSW will call back in order to get update on case status.    Signed:  [name], MSW, LCSWA, LCAS 10/10/2021 3:26 PM

## 2021-10-10 NOTE — Progress Notes (Signed)
[facility] Post 1:1 Observation Documentation  For the first (8) hours following discontinuation of 1:1 precautions, a progress note entry by nursing staff should be documented at least every 2 hours, reflecting the patient's behavior, condition, mood, and conversation.  Use the progress notes for additional entries.  Time 1:1 discontinued:  0922  Patient's Behavior:  pacing hall  Patient's Condition:  restless and confused.   Patient's Conversation: Demanding d/c to go pick up her things at her storage. Required multiple verbal redirections.   Sherryl Manges 10/10/2021, 1322

## 2021-10-10 NOTE — Progress Notes (Signed)
   10/10/21 2015  Psych Admission Type (Psych Patients Only)  Admission Status Involuntary  Psychosocial Assessment  Patient Complaints Anxiety;Confusion  Eye Contact Fair  Facial Expression Flat  Affect Depressed  Speech Slurred  Interaction Cautious  Motor Activity Unsteady  Appearance/Hygiene Disheveled  Behavior Characteristics Cooperative  Mood Preoccupied;Pleasant  Aggressive Behavior  Effect No apparent injury  Thought Process  Coherency Disorganized  Content Paranoia  Delusions Paranoid  Perception Depersonalization  Hallucination None reported or observed  Judgment Impaired  Confusion Mild  Danger to Self  Current suicidal ideation? Denies  Danger to Others  Danger to Others None reported or observed

## 2021-10-10 NOTE — Group Note (Signed)
LCSW Group Therapy  Type of Therapy and Topic:  Group Therapy: Thoughts, Feelings, and Actions  Participation Level:  Active   Description of Group:   In this group, each patient discussed their previous experiencing and understanding of overthinking, identifying the harmful impact on their lives. As a group, each patient was introduced to the basic concepts of Cognitive Behavioral Therapy: that thoughts, feelings, and actions are all connected and influence one another. They were given examples of how overthinking can affect our feelings, actions, and vise versa. The group was then asked to analyze how overthinking was harmful and brainstorm alternative thinking patterns/reactions to the example situation. Then, each group member filled out and identified their own example situation in which a problem situation caused their thoughts, feelings, and actions to be negatively impacted; they were asked to come up with 3 new (more adaptive/positive) thoughts that led to 3 new feelings and actions.  Therapeutic Goals: Patients will review and discuss their past experience with overthinking. Patients will learn the basics of the CBT model through group-led examples.. Patients will identify situations where they may have negative thoughts, feelings, or actions and will then reframe the situation using more positive thoughts to react differently.  Summary of Patient Progress:  The patient shared that they were unlovable. Patient contributed to the discussion of how thoughts, feelings, and actions interact, noting when they may have experienced a negative thought pattern and recognized it as harmful. They were attentive when other patients shared their experiences, and worked to reframe their own thoughts in an activity to identify future situations where they may typically overthink.  Therapeutic Modalities:   Cognitive Behavioral Therapy Mindfulness  Beatris Si, LCSW 10/10/2021  1:57 PM

## 2021-10-10 NOTE — Progress Notes (Signed)
[facility] Post 1:1 Observation Documentation  For the first (8) hours following discontinuation of 1:1 precautions, a progress note entry by nursing staff should be documented at least every 2 hours, reflecting the patient's behavior, condition, mood, and conversation.  Use the progress notes for additional entries.  Time 1:1 discontinued:  0922  Patient's Behavior:Verbally redirectable   Patient's Condition:  Anxious with mild confusion but is cooperative with care.  Patient's Conversation:  "I need to get my stuff out of the storage".  Sherryl Manges 10/10/2021, 1122

## 2021-10-10 NOTE — Progress Notes (Signed)
Psychoeducational Group Note  Date:  10/10/2021 Time:  2034  Group Topic/Focus:  Wrap-Up Group:   The focus of this group is to help patients review their daily goal of treatment and discuss progress on daily workbooks.  Participation Level: Did Not Attend  Participation Quality:  Not Applicable  Affect:  Not Applicable  Cognitive:  Not Applicable  Insight:  Not Applicable  Engagement in Group: Not Applicable  Additional Comments:  The patient did not attend group this evening.   Hazle Coca S 10/10/2021, 8:34 PM

## 2021-10-10 NOTE — BH IP Treatment Plan (Signed)
Interdisciplinary Treatment and Diagnostic Plan Update  10/10/2021 Time of Session: 10:35am  Natalie Brown MRN: [id number]  Principal Diagnosis: Bipolar affective disorder, current episode manic with psychotic symptoms (HCC)  Secondary Diagnoses: Principal Problem:   Bipolar affective disorder, current episode manic with psychotic symptoms (HCC) Active Problems:   Increased ammonia level   Valproic acid causing adverse effect in therapeutic use   Altered mental status   Current Medications:  Current Facility-Administered Medications  Medication Dose Route Frequency Provider Last Rate Last Admin   alum & mag hydroxide-simeth (MAALOX/MYLANTA) 200-200-20 MG/5ML suspension 30 mL  30 mL Oral Q4H PRN Massengill, Harrold Donath, MD       benztropine (COGENTIN) tablet 0.5 mg  0.5 mg Oral BID Karsten Ro, MD   0.5 mg at 10/10/21 0752   docusate sodium (COLACE) capsule 100 mg  100 mg Oral Daily Mason Jim, Amy E, MD   100 mg at 10/10/21 0752   feeding supplement (KATE FARMS STANDARD 1.4) liquid 325 mL  325 mL Oral BID BM Massengill, Harrold Donath, MD   325 mL at 10/09/21 0823   fluticasone (FLONASE) 50 MCG/ACT nasal spray 1 spray  1 spray Each Nare Daily Massengill, Harrold Donath, MD   1 spray at 10/10/21 0751   haloperidol (HALDOL) tablet 10 mg  10 mg Oral BID Abbott Pao, Nadir, MD   10 mg at 10/10/21 1829   hydrOXYzine (ATARAX) tablet 25 mg  25 mg Oral TID PRN Phineas Inches, MD   25 mg at 09/10/21 1204   lactulose (CHRONULAC) 10 GM/15ML solution 20 g  20 g Oral Daily Doda, Vandana, MD   20 g at 10/10/21 0751   LORazepam (ATIVAN) tablet 1 mg  1 mg Oral Q6H PRN Bartholomew Crews E, MD   1 mg at 10/07/21 1546   magnesium hydroxide (MILK OF MAGNESIA) suspension 30 mL  30 mL Oral Daily PRN Massengill, Harrold Donath, MD       nicotine polacrilex (NICORETTE) gum 2 mg  2 mg Oral PRN Abbott Pao, Nadir, MD   2 mg at 09/22/21 0821   OLANZapine zydis (ZYPREXA) disintegrating tablet 5 mg  5 mg Oral Q8H PRN Bartholomew Crews E, MD   5 mg at  09/14/21 1255   And   ziprasidone (GEODON) injection 20 mg  20 mg Intramuscular PRN Comer Locket, MD       valproic acid (DEPAKENE) 250 MG/5ML solution 250 mg  250 mg Oral BID Karsten Ro, MD   250 mg at 10/10/21 9371   PTA Medications: Medications Prior to Admission  Medication Sig Dispense Refill Last Dose   CLARITIN 10 MG tablet Take 10 mg by mouth daily.      fluticasone (FLONASE) 50 MCG/ACT nasal spray Place 1 spray into both nostrils daily.      pseudoephedrine-guaifenesin (MUCINEX D) 60-600 MG 12 hr tablet Take 1 tablet by mouth every 12 (twelve) hours. 30 tablet 0     Patient Stressors: Health problems   Medication change or noncompliance    Patient Strengths: Capable of independent living  Printmaker for treatment/growth  Supportive family/friends   Treatment Modalities: Medication Management, Group therapy, Case management,  1 to 1 session with clinician, Psychoeducation, Recreational therapy.   Physician Treatment Plan for Primary Diagnosis: Bipolar affective disorder, current episode manic with psychotic symptoms (HCC) Long Term Goal(s): Improvement in symptoms so as ready for discharge   Short Term Goals: Ability to identify and develop effective coping behaviors will improve Ability to maintain clinical measurements within normal  limits will improve Compliance with prescribed medications will improve Ability to identify changes in lifestyle to reduce recurrence of condition will improve Ability to verbalize feelings will improve Ability to disclose and discuss suicidal ideas Ability to demonstrate self-control will improve  Medication Management: Evaluate patient's response, side effects, and tolerance of medication regimen.  Therapeutic Interventions: 1 to 1 sessions, Unit Group sessions and Medication administration.  Evaluation of Outcomes: Progressing  Physician Treatment Plan for Secondary Diagnosis: Principal Problem:    Bipolar affective disorder, current episode manic with psychotic symptoms (HCC) Active Problems:   Increased ammonia level   Valproic acid causing adverse effect in therapeutic use   Altered mental status  Long Term Goal(s): Improvement in symptoms so as ready for discharge   Short Term Goals: Ability to identify and develop effective coping behaviors will improve Ability to maintain clinical measurements within normal limits will improve Compliance with prescribed medications will improve Ability to identify changes in lifestyle to reduce recurrence of condition will improve Ability to verbalize feelings will improve Ability to disclose and discuss suicidal ideas Ability to demonstrate self-control will improve     Medication Management: Evaluate patient's response, side effects, and tolerance of medication regimen.  Therapeutic Interventions: 1 to 1 sessions, Unit Group sessions and Medication administration.  Evaluation of Outcomes: Progressing   RN Treatment Plan for Primary Diagnosis: Bipolar affective disorder, current episode manic with psychotic symptoms (HCC) Long Term Goal(s): Knowledge of disease and therapeutic regimen to maintain health will improve  Short Term Goals: Ability to remain free from injury will improve, Ability to participate in decision making will improve, Ability to verbalize feelings will improve, Ability to disclose and discuss suicidal ideas, and Ability to identify and develop effective coping behaviors will improve  Medication Management: RN will administer medications as ordered by provider, will assess and evaluate patient's response and provide education to patient for prescribed medication. RN will report any adverse and/or side effects to prescribing provider.  Therapeutic Interventions: 1 on 1 counseling sessions, Psychoeducation, Medication administration, Evaluate responses to treatment, Monitor vital signs and CBGs as ordered, Perform/monitor  CIWA, COWS, AIMS and Fall Risk screenings as ordered, Perform wound care treatments as ordered.  Evaluation of Outcomes: Progressing   LCSW Treatment Plan for Primary Diagnosis: Bipolar affective disorder, current episode manic with psychotic symptoms (HCC) Long Term Goal(s): Safe transition to appropriate next level of care at discharge, Engage patient in therapeutic group addressing interpersonal concerns.  Short Term Goals: Engage patient in aftercare planning with referrals and resources, Increase social support, Increase emotional regulation, Facilitate acceptance of mental health diagnosis and concerns, Identify triggers associated with mental health/substance abuse issues, and Increase skills for wellness and recovery  Therapeutic Interventions: Assess for all discharge needs, 1 to 1 time with Social worker, Explore available resources and support systems, Assess for adequacy in community support network, Educate family and significant other(s) on suicide prevention, Complete Psychosocial Assessment, Interpersonal group therapy.  Evaluation of Outcomes: Progressing   Progress in Treatment: Attending groups: Yes. Participating in groups: No. Taking medication as prescribed: Yes. Toleration medication: Yes. Family/Significant other contact made: Yes, individual(s) contacted:  SPE completed with [name], Mother.  Patient understands diagnosis: No. Discussing patient identified problems/goals with staff: Yes. Medical problems stabilized or resolved: Yes. Denies suicidal/homicidal ideation: Yes. Issues/concerns per patient self-inventory: Yes. Other: none        New problem(s) identified: No, Describe:  none    New Short Term/Long Term Goal(s): Patient to work towards elimination of  symptoms of psychosis, medication management for mood stabilization; elimination of SI thoughts; development of comprehensive mental wellness/sobriety plan.   Patient Goals: No additional goals  identified at this time. Patient to continue to work towards original goals identified in initial treatment team meeting. CSW will remain available to patient should they voice additional treatment goals.    Discharge Plan or Barriers: Patient continues to be homeless, CSW working with community resources for housing and disability assistance.    Reason for Continuation of Hospitalization: Other; describe disorganized thoguhts and behaviors.    Estimated Length of Stay: 3-7 days     Last 3 [place] Suicide Severity Risk Score: Flowsheet Row Admission (Current) [facility] from [facility]05/2021 in [facility] from [facility]/26/2020 in [facility]  C-SSRS RISK CATEGORY High Risk High Risk No Risk       Last PHQ 2/9 Scores:    05/26/2020    2:14 PM 09/03/2017    4:34 PM 08/07/2016    2:12 PM  Depression screen PHQ 2/9  Decreased Interest 3 3 3   Down, Depressed, Hopeless 3 1 2   PHQ - 2 Score 6 4 5   Altered sleeping 0 3 3  Tired, decreased energy 0 3 0  Change in appetite 3 3 0  Feeling bad or failure about yourself  0 3 1  Trouble concentrating 3 0 0  Moving slowly or fidgety/restless 3 0 1  Suicidal thoughts 0 0 0  PHQ-9 Score 15 16 10     Scribe for Treatment Team: , 10/10/2021 9:18 AM

## 2021-10-10 NOTE — Progress Notes (Signed)
[facility] Post 1:1 Observation Documentation  For the first (8) hours following discontinuation of 1:1 precautions, a progress note entry by nursing staff should be documented at least every 2 hours, reflecting the patient's behavior, condition, mood, and conversation.  Use the progress notes for additional entries.  Time 1:1 discontinued:  0922  Patient's Behavior:  Cooperative with care and unit routines.  Patient's Condition: Anxious but A & O to self, place and events. Ambulatory to dayroom for orientation group without issues.   Patient's Conversation:  Requesting to call mom after group.  Sherryl Manges 10/10/2021, [phone]

## 2021-10-10 NOTE — Progress Notes (Signed)
[facility] MD Progress Note  10/10/2021 3:57 PM Natalie Brown  MRN:  [id number]   Reason for Admission:  Natalie Brown is a 56 y.o. female with a history of bipolar d/o, who was initially admitted for inpatient psychiatric hospitalization on 09/06/2021 for management of delusions and disorganized thinking.  Chart Review from last 24 hours: The patient's chart was reviewed and nursing notes were reviewed.The patient's case was discussed in multidisciplinary team meeting. Per nursing, she was seen walking in the hallway without any gait belt, her gait was stable. Per [facility] she was compliant with scheduled medications and did not require any as needed medication for agitation.  Information obtained today during patient  interview: Patient was seen in the hallway walking without any assistance.  She was seen in her room.  She reports that her mood is "good "and denies any anxiety or depression.  She reports that she showered this morning.  She slept well last night and reports stable appetite.  She ate eggs and grits this morning.  Discussed that her creatinine is little high so she needs to keep herself hydrated.  Patient verbalizes understanding.  [name] still delusional and confused, states she used to work here at [employer] in [place].  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  She reports that she had clients at multiple places. She continues to deny SI HI or AVH, and denies paranoia, ideas of reference or first rank symptoms.  [name] oriented to month and year but not to date.  When asked about year, patient states "June ". She repeated this 3 times before  answering it correctly.  Knows she is in [place].  On exam, no tremors, rigidity, or drooling noted. While examining patient's muscles for rigidity, patient started doing yoga pose, states "I know yoga pose".  She denies any physical symptoms. Later this  afternoon, NS reported that patient is more confused and wanted to leave the unit.  Principal Problem: Bipolar affective disorder, current episode manic with psychotic symptoms ([place])  Diagnosis: Principal Problem:   Bipolar affective disorder, current episode manic with psychotic symptoms ([place]) Active Problems:   Increased ammonia level   Valproic acid causing adverse effect in therapeutic use   Altered mental status  Past Psychiatric History: see H&P  Past Medical History:  Past Medical History:  Diagnosis Date   Bipolar 1 disorder with moderate mania ([place]) 12/30/2013   Hypertension    Family History: see H&P  Family Psychiatric  History: see H&P  Social History:  Social History   Substance and Sexual Activity  Alcohol Use No     Social History   Substance and Sexual Activity  Drug Use No    Social History   Socioeconomic History   Marital status: Married    Spouse name: Not on file   Number of children: Not on file   Years of education: Not on file   Highest education level: Not on file  Occupational History   Not on file  Tobacco Use   Smoking status: Every Day   Smokeless tobacco: Current  Substance and Sexual Activity   Alcohol use: No   Drug use: No   Sexual activity: Not on file  Other Topics Concern   Not on file  Social History Narrative   Not on file   Social Determinants of Health   Financial Resource Strain: Not on  file  Food Insecurity: Not on file  Transportation Needs: Not on file  Physical Activity: Not on file  Stress: Not on file  Social Connections: Not on file   Sleep: Good  Appetite:  Good  Current Medications: Current Facility-Administered Medications  Medication Dose Route Frequency Provider Last Rate Last Admin   alum & mag hydroxide-simeth (MAALOX/MYLANTA) 200-200-20 MG/5ML suspension 30 mL  30 mL Oral Q4H PRN Massengill, Nathan, MD       benztropine (COGENTIN) tablet 0.5 mg  0.5 mg Oral BID Armando Reichert, MD   0.5 mg at  10/10/21 0752   docusate sodium (COLACE) capsule 100 mg  100 mg Oral Daily Nelda Marseille, Amy E, MD   100 mg at 10/10/21 0752   feeding supplement (KATE FARMS STANDARD 1.4) liquid 325 mL  325 mL Oral BID BM Massengill, Nathan, MD   325 mL at 10/10/21 1406   fluticasone (FLONASE) 50 MCG/ACT nasal spray 1 spray  1 spray Each Nare Daily Massengill, [name], MD   1 spray at 10/10/21 0751   haloperidol (HALDOL) tablet 10 mg  10 mg Oral BID Winfred Leeds, Nadir, MD   10 mg at 10/10/21 4970   hydrOXYzine (ATARAX) tablet 25 mg  25 mg Oral TID PRN Janine Limbo, MD   25 mg at 09/10/21 1204   lactulose (CHRONULAC) 10 GM/15ML solution 20 g  20 g Oral Daily Benecio Kluger, MD   20 g at 10/10/21 0751   LORazepam (ATIVAN) tablet 1 mg  1 mg Oral Q6H PRN Viann Fish E, MD   1 mg at 10/07/21 1546   magnesium hydroxide (MILK OF MAGNESIA) suspension 30 mL  30 mL Oral Daily PRN Massengill, [name], MD       nicotine polacrilex (NICORETTE) gum 2 mg  2 mg Oral PRN Winfred Leeds, Nadir, MD   2 mg at 09/22/21 0821   OLANZapine zydis (ZYPREXA) disintegrating tablet 5 mg  5 mg Oral Q8H PRN Viann Fish E, MD   5 mg at 09/14/21 1255   And   ziprasidone (GEODON) injection 20 mg  20 mg Intramuscular PRN Harlow Asa, MD       valproic acid (DEPAKENE) 250 MG/5ML solution 250 mg  250 mg Oral BID Armando Reichert, MD   250 mg at 10/10/21 2637   Lab Results:  Results for orders placed or performed during the hospital encounter of 09/06/21 (from the past 48 hour(s))  ANA w/Reflex if Positive     Status: None   Collection Time: 10/09/21  6:17 PM  Result Value Ref Range   Anti Nuclear Antibody (ANA) Negative Negative    Comment: (NOTE) Performed At: [facility] [place], [place] [id number] Rush Farmer MD [id number]   Ammonia     Status: Abnormal   Collection Time: 10/09/21  6:17 PM  Result Value Ref Range   Ammonia 48 (H) 9 - 35 umol/L    Comment: Performed at [facility], [place]  [address] [place], [place] [address]  Ceruloplasmin     Status: None   Collection Time: 10/09/21  6:17 PM  Result Value Ref Range   Ceruloplasmin 24.2 19.0 - 39.0 mg/dL    Comment: (NOTE) Performed At: [facility] [place], [place] [id number] Rush Farmer MD [id number]   Comprehensive metabolic panel     Status: Abnormal   Collection Time: 10/09/21  6:17 PM  Result Value Ref Range   Sodium 142 135 - 145 mmol/L   Potassium 4.5  3.5 - 5.1 mmol/L   Chloride 109 98 - 111 mmol/L   CO2 25 22 - 32 mmol/L   Glucose, Bld 104 (H) 70 - 99 mg/dL    Comment: Glucose reference range applies only to samples taken after fasting for at least 8 hours.   BUN 23 (H) 6 - 20 mg/dL   Creatinine, Ser 1.35 (H) 0.44 - 1.00 mg/dL   Calcium 8.3 (L) 8.9 - 10.3 mg/dL   Total Protein 7.2 6.5 - 8.1 g/dL   Albumin 3.4 (L) 3.5 - 5.0 g/dL   AST 20 15 - 41 U/L   ALT 11 0 - 44 U/L   Alkaline Phosphatase 52 38 - 126 U/L   Total Bilirubin 0.4 0.3 - 1.2 mg/dL   GFR, Estimated 47 (L) >60 mL/min    Comment: (NOTE) Calculated using the CKD-EPI Creatinine Equation (2021)    Anion gap 8 5 - 15    Comment: Performed at [facility], [place] [address], [place], [place] [address]  Rapid HIV screen (HIV 1/2 Ab+Ag)     Status: None   Collection Time: 10/09/21  6:17 PM  Result Value Ref Range   HIV-1 P24 Antigen - HIV24 NON REACTIVE NON REACTIVE    Comment: (NOTE) Detection of p24 may be inhibited by biotin in the sample, causing false negative results in acute infection.    HIV 1/2 Antibodies NON REACTIVE NON REACTIVE   Interpretation (HIV Ag Ab)      A non reactive test result means that HIV 1 or HIV 2 antibodies and HIV 1 p24 antigen were not detected in the specimen.    Comment: RESULT CALLED TO, READ BACK BY AND VERIFIED WITH: M.PHILLIPS, RN AT 2018 ON 06.20.23 BY N.THOMPSON Performed at [facility], [place] [address], [place], [place] [address]   RPR      Status: None   Collection Time: 10/09/21  6:17 PM  Result Value Ref Range   RPR Ser Ql NON REACTIVE NON REACTIVE    Comment: Performed at [facility], [place] [address] [place], [place] [address]  Sedimentation rate     Status: None   Collection Time: 10/09/21  6:17 PM  Result Value Ref Range   Sed Rate 6 0 - 22 mm/hr    Comment: Performed at [facility], [place] [address], [place], [place] [address]  Valproic acid level     Status: Abnormal   Collection Time: 10/09/21  6:17 PM  Result Value Ref Range   Valproic Acid Lvl 25 (L) 50.0 - 100.0 ug/mL    Comment: Performed at [facility], [place] [address] [place], [place] [address]    Blood Alcohol level:  Lab Results  Component Value Date   ETH <10 13/11/6576   Metabolic Disorder Labs: Lab Results  Component Value Date   HGBA1C 5.3 09/08/2021   MPG 105.41 09/08/2021   No results found for: "PROLACTIN" Lab Results  Component Value Date   CHOL 173 09/08/2021   TRIG 110 09/08/2021   HDL 50 09/08/2021   CHOLHDL 3.5 09/08/2021   VLDL 22 09/08/2021   LDLCALC 101 (H) 09/08/2021   LDLCALC 102 (H) 06/21/2020   Physical Findings:  Musculoskeletal: Strength & Muscle Tone: within normal limits Gait & Station: normal Patient leans: N/A  Psychiatric Specialty Exam: Psychiatric Specialty Exam: Physical Exam Vitals and nursing note reviewed.  Constitutional:      General: [name] not in acute distress.  Appearance: [name] not ill-appearing, toxic-appearing or diaphoretic.  HENT:     Head: Normocephalic.  Pulmonary:     Effort: Pulmonary effort is normal.  Neurological:     Mental Status: [name] alert.     Review of Systems  Constitutional: Negative.  Negative for fever.  Respiratory: Negative.  Negative for shortness of breath.   Cardiovascular: Negative.  Negative for chest pain.  Gastrointestinal:  Negative for diarrhea, nausea and vomiting.  Neurological:  Negative for tremors,  light-headedness and headaches.  Psychiatric/Behavioral:  Negative for hallucinations and suicidal ideas. The patient is not nervous/anxious.     Blood pressure 113/69, pulse (!) 110, temperature 99.3 F (37.4 C), temperature source Oral, resp. rate 16, height 5' 6"  (1.676 m), weight 72.6 kg, SpO2 100 %.Body mass index is 25.83 kg/m.  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; is not grossly responding to internal/external stimuli on exam; denies paranoia, ideas of reference, first rank sx, SI, HI or AVH  Suicidal Thoughts:  No  Homicidal Thoughts:  No  Memory: Limited  Judgement:  Impaired  Insight:   poor  Psychomotor Activity:  Normal  Concentration:  Concentration: Fair and Attention Span: Fair  Recall:  Limited  Fund of Knowledge:  Poor  Language:  Good  Akathisia:  Negative  Handed:  Ambidextrous  AIMS (if indicated):     Assets:  Communication Skills Desire for Improvement Physical Health  ADL's:  Impaired  Cognition:  Impaired,  Mild  Sleep:  Number of Hours: 8.5   Blood pressure 113/69, pulse (!) 110, temperature 99.3 F (37.4 C), temperature source Oral, resp. rate 16, height 5' 6"  (1.676 m), weight 72.6 kg, SpO2 100 %. Body mass index is 25.83 kg/m.  Treatment Plan Summary: Diagnoses / Active Problems: Bipolar I MRE manic severe with psychotic features  She continues to have poor insight and judgment regarding her mental health needs and need for follow-up after discharge and need to establish safe residence to limit decompensation.Given  today's evaluation and the earlier evaluations during this hospital stay, in my opinion patient at baseline lacks capacity to make medical or social decisions regarding placement or finances, and would benefit from a guardian to make decisions on her behalf in her best interest. She appears less delirious this morning.  Depakote will be stopped after today's dose.  CT shows marked atrophy.  First psychosis labs until now are all negative.  Heavy metals still pending.  Patient again more confused in the afternoon.  We will order additional dose of lactulose and check ammonia this evening.  We will continue to monitor symptoms. PLAN: Safety and Monitoring:  -- Converted to IVC admission  -- Daily contact with patient to assess and evaluate symptoms and progress in treatment  -- Patient's case to be discussed in multi-disciplinary team meeting  -- Observation Level : q15 minute checks  -- Vital signs:  q12 hours  -- Precautions: suicide, elopement, and assault, fall risk  -- Attempting off unit privileges and monitoring behavior but continuing 1:1 for fall risk  2. Psychiatric Diagnoses and Treatment:   Bipolar I MRE manic with psychotic features  -- Zyprexa was discontinued secondary to lack of efficacy  -Continue Haldol 10 mg BID for psychosis and mood stabilization  -Decrease Cogentin to 0.5 mg twice daily in event anticholinergic med has contributed to recent confusion             -Invega Sustenna 234 mg IM given on 09-30-21. Holding on giving  2nd loading Invega sustenna 19m IM dose at this time due to recent confusion and need for multiple medication presently Lipid Panel: WNL except for LDL 101; HbgA1c: 5.3; QTc:4129m             -Stop Depakote after today's dose.  tapering off VPA given recent hyperammonemia -most recent Depakote level on 6/20-25 (tapering Depakote), Ammonia trending down 48; CBC, LFT on 6/20 within normal limits -repeating ammonia level this evening due to confusion.  --  Ativan discontinued to limit sedative agents.  -- Zyprexa/Geodon/ativan agitation protocol  -- Encouraged patient to participate in unit milieu and in scheduled group therapies             -Additional psychosis labs (HIV- , RPR-, ANA-, heavy metal pending, ESR wnl, ceruloplasmin wnl) and             CT head - Marked for age brain atrophy.  No acute or reversible finding.  3. Medical Issues Being Addressed:               Hyperammonemia            -Continue lactulose 20 g daily.             -Give additional dose of lactulose 10 g once.             -Repeating ammonia level today given increased confusion.   - Tapering off VPA.  No Depakote from tomorrow.   Mildly elevated creatinine   -- Last CMP shows increasing creatinine 1. 35 and po hydration encouraged   Constipation  - continue colace 10032maily and encouraged PRN MOM   Season allergies  - continue Claritin 43m33mily PRN              Urinary incontinence            -urinalysis-WNL  4. Discharge Planning:   -- Social work and case management to assist with discharge planning and identification of hospital follow-up needs prior to discharge  -- Discharge Concerns: Need to establish a safety plan; Medication compliance and effectiveness  -- Discharge Goals: Return home with outpatient referrals for mental health follow-up including medication management/psychotherapy Patient is homeless with limited insurance coverage, we will plan discharging to homeless shelters with ACT team resources and follow-up. Referrals for ACT team have been placed by social work. Also recommending applying for disability and Medicaid to facilitate long-term placement which will be needed to avoid further decompensation and rehospitalization.  Mom is applying for guardianship later this week. CSW following.   VandArmando Reichert PGY2 Psychiatry Resident

## 2021-10-11 DIAGNOSIS — F312 Bipolar disorder, current episode manic severe with psychotic features: Secondary | ICD-10-CM | POA: Diagnosis not present

## 2021-10-11 LAB — HEAVY METALS, BLOOD
Arsenic: 2 ug/L (ref 0–9)
Lead: 1 ug/dL (ref 0.0–3.4)
Mercury: <1 ug/L (ref 0.0–14.9)

## 2021-10-11 MED ORDER — HYDROCERIN EX CREA
TOPICAL_CREAM | Freq: Two times a day (BID) | CUTANEOUS | Status: DC
  Administered 2021-10-11 – 2021-12-22 (×29): 1 via TOPICAL
  Filled 2021-10-11 (×2): qty 113

## 2021-10-11 NOTE — Group Note (Signed)
Date:  10/11/2021 Time:  4:15 PM  Group Topic/Focus:  Coping With Mental Health Crisis:   The purpose of this group is to help patients identify strategies for coping with mental health crisis.  Group discusses possible causes of crisis and ways to manage them effectively. Crisis Planning:   The purpose of this group is to help patients create a crisis plan for use upon discharge or in the future, as needed. Making Healthy Choices:   The focus of this group is to help patients identify negative/unhealthy choices they were using prior to admission and identify positive/healthier coping strategies to replace them upon discharge. Recovery Goals:   The focus of this group is to identify appropriate goals for recovery and establish a plan to achieve them. Relapse Prevention Planning:   The focus of this group is to define relapse and discuss the need for planning to combat relapse. Self Care:   The focus of this group is to help patients understand the importance of self-care in order to improve or restore emotional, physical, spiritual, interpersonal, and financial health.    Participation Level:  Active  Participation Quality:  Attentive and Sharing  Affect:  Appropriate  Cognitive:  Alert  Insight: Limited  Engagement in Group:  Limited  Modes of Intervention:  Discussion and Education  Kerrin Champagne, OT   Ted Mcalpine 10/11/2021, 4:15 PM

## 2021-10-11 NOTE — Group Note (Addendum)
Recreation Therapy Group Note   Group Topic:Coping Skills  Group Date: 10/11/2021 Start Time: 1010 End Time: 1030 Facilitators: Caroll Rancher, LRT,CTRS Location: [address] Dayroom   Goal Area(s) Addresses: Patient will define what a coping skill is. Patient will successfully identify positive coping skills they can use post d/c.  Patient will acknowledge benefit(s) of using learned coping skills post d/c.   Group Description: Coping A to Z. Patient asked to identify what a coping skill is and when they use them. Patients with Clinical research associate discussed healthy versus unhealthy coping skills. Next patients were given a blank worksheet titled "Coping Skills A-Z".  Patients were instructed to come up with at least one positive coping skill per letter of the alphabet, addressing a specific challenge (ex: stress, anger, anxiety, depression, grief, doubt, isolation, self-harm/suicidal thoughts, substance use). Patients were given 15 minutes to brainstorm, before ideas were presented to the large group. Patients and LRT debriefed on the importance of coping skill selection based on situation and back-up plans when a skill tried is not effective. At the end of group, patients were given an handout of alphabetized strategies to keep for future reference.   Affect/Mood: Appropriate   Participation Level: Active   Participation Quality: Independent   Behavior: Appropriate   Speech/Thought Process: Disorganized   Insight: Lacking   Judgement: Lacking    Modes of Intervention: Worksheet   Patient Response to Interventions:  Engaged   Education Outcome:  Acknowledges education and In group clarification offered    Clinical Observations/Individualized Feedback: Pt was unable to follow the instructions.  Pt seemed confused about what to do.  Pt came up with words for the letters but actual coping skills.  Pt was to come up with coping skills for doubt.  However, pt came up with things like car,  nutmeg, Godly and healthy. Pt was attentive and appropriate.     Plan: Continue to engage patient in RT group sessions 2-3x/week.   Caroll Rancher, LRT,CTRS 10/11/2021 11:45 AM

## 2021-10-11 NOTE — Progress Notes (Signed)
   10/11/21 0530  Sleep  Number of Hours 7.5

## 2021-10-11 NOTE — Progress Notes (Signed)
   10/11/21 2015  Psych Admission Type (Psych Patients Only)  Admission Status Involuntary  Psychosocial Assessment  Patient Complaints Anxiety;Confusion  Eye Contact Fair  Facial Expression Flat  Affect Depressed  Speech Slurred  Interaction Cautious  Motor Activity Unsteady  Appearance/Hygiene Disheveled  Behavior Characteristics Cooperative  Mood Preoccupied;Pleasant  Aggressive Behavior  Effect No apparent injury  Thought Process  Coherency Disorganized  Content Paranoia  Delusions Paranoid  Perception Depersonalization  Hallucination None reported or observed  Judgment Impaired  Confusion Mild  Danger to Self  Current suicidal ideation? Denies  Danger to Others  Danger to Others None reported or observed

## 2021-10-11 NOTE — Progress Notes (Addendum)
ADDENDUM  [place] DSS Adult Protective Services has accepted case and intends on investigating. Writer was informed that case worker will reach out should they need additional information, representative unable to provide contact information for investigator at this time.   Patient's mother reports she intends on filing guardianship papers on this day 10/11/2021. CSW will follow up with mother tomorrow 10/12/2021.  Signed:  [name], MSW, [place], LCAS 10/11/2021 3:11 PM  [facility] LCSW Progress Note   10/11/2021    1:48 PM  Natalie Brown   [id number]   Type of Contact and Topic: APS report   CSW spoke with Threasa Beards, Loann Quill. DSS placement social worker [phone], for further coordination of services. Social worker states she is unable to provide any assistance without the patient having any funding source to pursue placement. She recommended CSW file another APS report.   CSW filed second APS report in past two days stating the patient lacks capacity to make decisions and is vulnerable to be exploited and inadvertent harm should she be discharged. DSS is currently reviewing report. Situation ongoing, CSW will continue to monitor and update note as more information becomes available.    Signed:  [name], MSW, LCSWA, LCAS 10/11/2021 1:48 PM

## 2021-10-11 NOTE — Progress Notes (Addendum)
Pt visible in milieu majority of this shift. Presents with fair eye contact, concrete responses to assessment questions with a fixed smile and soft speech. Denies SI, HI, AVH and pain "I will never hurt my self or anybody. I just want to leave". Pt remains preoccupied about discharge but is verbally redirectable. Pt attended scheduled groups, participates in activities. Went off unit for meals, returned without issues. Remains medication compliant. Fluid push continues, tolerated well. Safety checks maintained at Q 15 minutes intervals. Emotional support and reassurance provided to pt throughout this shift. Verbal education provided on all medications and effects monitored. Pt went off unit for meals, returned without issues. Denies concerns at this time.

## 2021-10-12 DIAGNOSIS — F312 Bipolar disorder, current episode manic severe with psychotic features: Secondary | ICD-10-CM | POA: Diagnosis not present

## 2021-10-12 LAB — BASIC METABOLIC PANEL
Anion gap: 7 (ref 5–15)
BUN: 20 mg/dL (ref 6–20)
CO2: 27 mmol/L (ref 22–32)
Calcium: 8.7 mg/dL — ABNORMAL LOW (ref 8.9–10.3)
Chloride: 109 mmol/L (ref 98–111)
Creatinine, Ser: 1.2 mg/dL — ABNORMAL HIGH (ref 0.44–1.00)
GFR, Estimated: 54 mL/min — ABNORMAL LOW (ref >60)
Glucose, Bld: 103 mg/dL — ABNORMAL HIGH (ref 70–99)
Potassium: 4.4 mmol/L (ref 3.5–5.1)
Sodium: 143 mmol/L (ref 135–145)

## 2021-10-12 LAB — AMMONIA: Ammonia: 46 umol/L — ABNORMAL HIGH (ref 9–35)

## 2021-10-12 MED ORDER — LACTULOSE 10 GM/15ML PO SOLN
20.0000 g | Freq: Two times a day (BID) | ORAL | Status: DC
  Administered 2021-10-12 – 2021-10-14 (×4): 20 g via ORAL
  Filled 2021-10-12 (×6): qty 30

## 2021-10-12 MED ORDER — PALIPERIDONE PALMITATE ER 156 MG/ML IM SUSY
156.0000 mg | PREFILLED_SYRINGE | Freq: Once | INTRAMUSCULAR | Status: AC
Start: 2021-10-12 — End: 2021-10-12
  Administered 2021-10-12: 156 mg via INTRAMUSCULAR

## 2021-10-12 NOTE — Progress Notes (Signed)
Pt focused on D/C, pt stated she has safe place to D/C to. Pt said she can stay with her mother. Pt was encouraged to let the doctor know , pt still confused, but presents with much clear thought process.     10/12/21 1930  Psych Admission Type (Psych Patients Only)  Admission Status Involuntary  Psychosocial Assessment  Patient Complaints Anxiety;Confusion  Eye Contact Fair  Facial Expression Flat  Affect Depressed  Speech Slurred  Interaction Cautious  Motor Activity Unsteady  Appearance/Hygiene Disheveled  Behavior Characteristics Cooperative  Mood Preoccupied;Pleasant  Aggressive Behavior  Effect No apparent injury  Thought Process  Coherency Disorganized  Content Paranoia  Delusions Paranoid  Perception Depersonalization  Hallucination None reported or observed  Judgment Impaired  Confusion Mild  Danger to Self  Current suicidal ideation? Denies  Danger to Others  Danger to Others None reported or observed

## 2021-10-13 DIAGNOSIS — F312 Bipolar disorder, current episode manic severe with psychotic features: Secondary | ICD-10-CM | POA: Diagnosis not present

## 2021-10-13 MED ORDER — HALOPERIDOL 5 MG PO TABS
10.0000 mg | ORAL_TABLET | Freq: Every day | ORAL | Status: DC
Start: 2021-10-13 — End: 2021-11-12
  Administered 2021-10-13 – 2021-11-11 (×30): 10 mg via ORAL
  Filled 2021-10-13 (×25): qty 2

## 2021-10-13 MED ORDER — HALOPERIDOL 5 MG PO TABS: 10.0000 mg | ORAL_TABLET | Freq: Every day | ORAL | Status: DC

## 2021-10-13 MED ORDER — HALOPERIDOL 5 MG PO TABS
5.0000 mg | ORAL_TABLET | Freq: Every day | ORAL | Status: DC
  Administered 2021-10-14 – 2021-11-16 (×34): 5 mg via ORAL
  Filled 2021-10-13 (×36): qty 1

## 2021-10-13 NOTE — Progress Notes (Signed)
     Patient denies SI, HI and AVH. Patient reports depression due to being in the hospital and wanting to discharge. Patient is forgetful and becomes confused.    Assess patient for safety, offer medications as prescribed, engage patient in 1:1 staff talks.    Continue to monitor as planned patient able to contract for safety.

## 2021-10-13 NOTE — Progress Notes (Addendum)
[facility] MD Progress Note  10/13/2021 5:50 PM Natalie Brown  MRN:  [id number]   Reason for Admission: Natalie Brown is a 56 y.o. female with a history of bipolar d/o, who was initially admitted for inpatient psychiatric hospitalization on 09/06/2021 for management of delusions and disorganized thinking.  Chart Review from last 24 hours: The patient's chart was reviewed and nursing notes were reviewed.The patient's case was discussed in multidisciplinary team meeting.  Per [facility] she was compliant with scheduled medications and did not require any as needed medication for agitation. Patient attended some groups.  She slept 8.75 hours last night.   Information obtained today during patient  interview: Patient was seen in [address] hall this morning. Pt presents today with a flat affect and depressed mood. Her attention to personal hygiene and grooming is poor, and the need to tend to personal hygiene and grooming reiterated.  Pt's eye contact is good, speech is clear & coherent. Thought contents are more organized and logical, and pt currently denies SI/HI/AVH or paranoia. She denies any thought insertion, deletion, thought broadcasting, and ideas of reference. She continues to report that she speaks 6 languages, is a Cytogeneticist, and studied diabetic nutrition. These might be fixed delusions. She has been tolerating her medications well without any side effects.  She denies being in any physical distress at this time, and denies any medication related side effects. No tremors visible, no drooling noted.   Pt continues to report today that she "need to go". When asked where she would be going to if she leaves this hospital, pt states that she will be going to live with her mother. As per social work, mother lives in a senior community where she is not allowed to have people live with her. Adult protective services has accepted pt's case, and will investigate as per social work documentation. Mother also pursuing  guardianship.  Pt currently on Lactulose 20 g BID for an elevated ammonia level of 46 (6/23). Will repeat ammonia level tomorrow (6/25). Pt received her 2nd dose of [employer]. Will continue other medications as listed below.   Principal Problem: Bipolar affective disorder, current episode manic with psychotic symptoms (HCC)  Diagnosis: Principal Problem:   Bipolar affective disorder, current episode manic with psychotic symptoms (HCC) Active Problems:   Increased ammonia level   Valproic acid causing adverse effect in therapeutic use   Altered mental status  Past Psychiatric History: see H&P  Past Medical History:  Past Medical History:  Diagnosis Date   Bipolar 1 disorder with moderate mania (HCC) 12/30/2013   Hypertension    Family History: see H&P  Family Psychiatric  History: see H&P  Social History:  Social History   Substance and Sexual Activity  Alcohol Use No     Social History   Substance and Sexual Activity  Drug Use No    Social History   Socioeconomic History   Marital status: Married    Spouse name: Not on file   Number of children: Not on file   Years of education: Not on file   Highest education level: Not on file  Occupational History   Not on file  Tobacco Use   Smoking status: Every Day   Smokeless tobacco: Current  Substance and Sexual Activity   Alcohol use: No   Drug use: No   Sexual activity: Not on file  Other Topics Concern   Not on file  Social History Narrative   Not on file  Social Determinants of Health   Financial Resource Strain: Not on file  Food Insecurity: Not on file  Transportation Needs: Not on file  Physical Activity: Not on file  Stress: Not on file  Social Connections: Not on file   Sleep: Good  Appetite:  Good  Current Medications: Current Facility-Administered Medications  Medication Dose Route Frequency Provider Last Rate Last Admin   alum & mag hydroxide-simeth (MAALOX/MYLANTA)  200-200-20 MG/5ML suspension 30 mL  30 mL Oral Q4H PRN Massengill, Nathan, MD       benztropine (COGENTIN) tablet 0.5 mg  0.5 mg Oral BID Karsten Ro, MD   0.5 mg at 10/13/21 0734   feeding supplement (KATE FARMS STANDARD 1.4) liquid 325 mL  325 mL Oral BID BM Massengill, Harrold Donath, MD   325 mL at 10/13/21 1712   fluticasone (FLONASE) 50 MCG/ACT nasal spray 1 spray  1 spray Each Nare Daily Massengill, Harrold Donath, MD   1 spray at 10/13/21 0734   haloperidol (HALDOL) tablet 10 mg  10 mg Oral BID Abbott Pao, Nadir, MD   10 mg at 10/13/21 0102   hydrocerin (EUCERIN) cream   Topical BID Phineas Inches, MD   Given at 10/13/21 1712   hydrOXYzine (ATARAX) tablet 25 mg  25 mg Oral TID PRN Phineas Inches, MD   25 mg at 09/10/21 1204   lactulose (CHRONULAC) 10 GM/15ML solution 20 g  20 g Oral BID Karsten Ro, MD   20 g at 10/13/21 1712   LORazepam (ATIVAN) tablet 1 mg  1 mg Oral Q6H PRN Bartholomew Crews E, MD   1 mg at 10/07/21 1546   magnesium hydroxide (MILK OF MAGNESIA) suspension 30 mL  30 mL Oral Daily PRN Massengill, Harrold Donath, MD       nicotine polacrilex (NICORETTE) gum 2 mg  2 mg Oral PRN Abbott Pao, Nadir, MD   2 mg at 09/22/21 0821   OLANZapine zydis (ZYPREXA) disintegrating tablet 5 mg  5 mg Oral Q8H PRN Bartholomew Crews E, MD   5 mg at 09/14/21 1255   And   ziprasidone (GEODON) injection 20 mg  20 mg Intramuscular PRN Comer Locket, MD       Lab Results:  Results for orders placed or performed during the hospital encounter of 09/06/21 (from the past 48 hour(s))  Ammonia     Status: Abnormal   Collection Time: 10/12/21  7:01 AM  Result Value Ref Range   Ammonia 46 (H) 9 - 35 umol/L    Comment: Performed at [facility], [address], [place], [place] [address]  Basic metabolic panel     Status: Abnormal   Collection Time: 10/12/21  7:01 AM  Result Value Ref Range   Sodium 143 135 - 145 mmol/L   Potassium 4.4 3.5 - 5.1 mmol/L   Chloride 109 98 - 111 mmol/L   CO2 27 22 - 32  mmol/L   Glucose, Bld 103 (H) 70 - 99 mg/dL    Comment: Glucose reference range applies only to samples taken after fasting for at least 8 hours.   BUN 20 6 - 20 mg/dL   Creatinine, Ser 6.44 (H) 0.44 - 1.00 mg/dL   Calcium 8.7 (L) 8.9 - 10.3 mg/dL   GFR, Estimated 54 (L) >60 mL/min    Comment: (NOTE) Calculated using the CKD-EPI Creatinine Equation (2021)    Anion gap 7 5 - 15    Comment: Performed at [facility], [address] [place], [place] [address]  Blood Alcohol level:  Lab Results  Component Value Date   ETH <10 09/05/2021   Metabolic Disorder Labs: Lab Results  Component Value Date   HGBA1C 5.3 09/08/2021   MPG 105.41 09/08/2021   No results found for: "PROLACTIN" Lab Results  Component Value Date   CHOL 173 09/08/2021   TRIG 110 09/08/2021   HDL 50 09/08/2021   CHOLHDL 3.5 09/08/2021   VLDL 22 09/08/2021   LDLCALC 101 (H) 09/08/2021   LDLCALC 102 (H) 06/21/2020   Physical Findings:  Musculoskeletal: Strength & Muscle Tone: within normal limits Gait & Station: normal Patient leans: N/A  Psychiatric Specialty Exam: Psychiatric Specialty Exam: Physical Exam Vitals and nursing note reviewed.  Constitutional:      General: [name] not in acute distress.    Appearance: [name] not ill-appearing, toxic-appearing or diaphoretic.  HENT:     Head: Normocephalic.  Pulmonary:     Effort: Pulmonary effort is normal.  Neurological:     Mental Status: [name] alert.     Review of Systems  Constitutional: Negative.  Negative for fever.  Respiratory: Negative.  Negative for shortness of breath.   Cardiovascular: Negative.  Negative for chest pain.  Gastrointestinal:  Negative for nausea and vomiting.  Genitourinary:  Positive for frequency.  Musculoskeletal: Negative.   Skin: Negative.   Neurological:  Positive for headaches. Negative for tremors and light-headedness.  Psychiatric/Behavioral:  Negative for hallucinations and suicidal  ideas. The patient is not nervous/anxious.     Blood pressure 109/71, pulse 83, temperature 98.3 F (36.8 C), temperature source Oral, resp. rate 16, height 5\' 6"  (1.676 m), weight 72.6 kg, SpO2 100 %.Body mass index is 25.83 kg/m.  General Appearance: casually dressed wearing hair bonnet  Eye Contact:  Fair  Speech:  Normal Rate - not speaking in foreign accent today  Volume: Within normal limits  Mood: anxious  Affect:  mildly anxious  Thought Process:  Ruminative about discharge planning and need to get to storage unit  Orientation:  Oriented to city, month and year not to situation and cannot name upcoming holiday, knows president  Thought Content: Has residual grandiose delusions that appear fixed; is not grossly responding to internal/external stimuli on exam; denies paranoia, ideas of reference, first rank sx, SI, HI or AVH  Suicidal Thoughts:  No  Homicidal Thoughts:  No  Memory: Limited  Judgement:  Impaired  Insight:   poor  Psychomotor Activity:  Normal  Concentration:  Poor  Recall:  Limited  Fund of Knowledge:  Poor  Language:  Good  Akathisia:  Negative  Handed:  Ambidextrous  AIMS (if indicated):     Assets:  Communication Skills Desire for Improvement Physical Health  ADL's:  Independent at this time  Cognition:  Impaired,  Mild  Sleep:  Number of Hours: 8.75   Blood pressure 109/71, pulse 83, temperature 98.3 F (36.8 C), temperature source Oral, resp. rate 16, height 5\' 6"  (1.676 m), weight 72.6 kg, SpO2 100 %. Body mass index is 25.83 kg/m.  Treatment Plan Summary: Diagnoses / Active Problems: Bipolar I MRE manic severe with psychotic features  She continues to have poor insight and judgment in regards to safe housing needs, medications, and mental health issues. She appears less delirious but has residual, fixed delusions. Depakote tapered and stopped. CT shows marked atrophy.  First psychosis labs  are all negative. Ammonia still high and creatinine  trending down.  Will continue lactulose dose and completed Invega Sustenna LAI 156 2nd  loading dose in the hopes we can transition to LAI and taper down on Haldol.  We will continue to monitor symptoms.  PLAN: Safety and Monitoring:  -- Converted to IVC admission  -- Daily contact with patient to assess and evaluate symptoms and progress in treatment  -- Patient's case to be discussed in multi-disciplinary team meeting  -- Observation Level : q15 minute checks  -- Vital signs:  q12 hours  -- Precautions: suicide, elopement, and assault, fall risk  -- Attempting off unit privileges and monitoring behavior but continuing 1:1 for fall risk   2. Psychiatric Diagnoses and Treatment:   Bipolar I MRE manic with psychotic features (r/o schizoaffective d/o - bipolar type)  -- Zyprexa was discontinued secondary to lack of efficacy  -Reduce Haldol to 5mg  po qam and 10mg  qhs now that she is clearing some and after loading with Invega sustenna   -Continue Cogentin to 0.5 mg twice daily (decreased recently in event anticholinergic med has contributed to recent confusion)             -Invega Sustenna 234 mg IM given on 09-30-21 and received 2nd loading dose of 156mg  IM 10/12/20 with hopes of transition to LAI long-term for help with compliance Lipid Panel: WNL except for LDL 101; HbgA1c: 5.3; QTc:433ms - repeating EKG for monitoring of QTC              -Depakote tapered and stopped given recent hyperammonemia - Ammonia trending down 48; CBC, LFT on 6/20 within normal limits   -- Ativan discontinued to limit sedative agents.  -- Zyprexa/Geodon/ativan agitation protocol  -- Encouraged patient to participate in unit milieu and in scheduled group therapies             -Additional psychosis labs (HIV- , RPR-, ANA-, heavy metal WNL, ESR wnl, ceruloplasmin wnl and CT head shows Marked for age brain atrophy.  No acute or reversible finding.)   3. Medical Issues Being Addressed:               Hyperammonemia             -Continue lactulose 20 g twice daily.             -Repeat ammonia level still high.  We will keep on trending with repeat level 6/25  -  Depakote tapered and stopped.  - Stop colace with use of lactulose   Mildly elevated creatinine   --  creatinine downtrending to 1.20 from 1.35 -  po hydration encouraged.              --- We will keep on trending   Season allergies  - continue Claritin 10mg  daily PRN              Urinary frequency            -repeat UA ordered, not collected, and reordered  4. Discharge Planning:   -- Social work and case management to assist with discharge planning and identification of hospital follow-up needs prior to discharge  -- Discharge Concerns: Need to establish a safety plan; Medication compliance and effectiveness  -- Discharge Goals: Return home with outpatient referrals for mental health follow-up including medication management/psychotherapy Patient is homeless with limited insurance coverage, we will plan discharging to homeless shelters with ACT team resources and follow-up. Referrals for ACT team have been placed by social work. Also recommending applying for disability and Medicaid to facilitate long-term placement which will be needed to avoid further decompensation  and rehospitalization.  Mom is applying for guardianship  this week. CSW following and APS case accepted.   Natalie Blue, NP  Psychiatry ResidentPatient ID: Natalie Brown, female   DOB: 04/22/1966, 56 y.o.   MRN: [id number]

## 2021-10-14 DIAGNOSIS — F312 Bipolar disorder, current episode manic severe with psychotic features: Secondary | ICD-10-CM | POA: Diagnosis not present

## 2021-10-14 LAB — URINALYSIS, COMPLETE (UACMP) WITH MICROSCOPIC
Bilirubin Urine: NEGATIVE
Glucose, UA: NEGATIVE mg/dL
Hgb urine dipstick: NEGATIVE
Ketones, ur: NEGATIVE mg/dL
Leukocytes,Ua: NEGATIVE
Nitrite: NEGATIVE
Protein, ur: NEGATIVE mg/dL
Specific Gravity, Urine: 1.017 (ref 1.005–1.030)
pH: 5 (ref 5.0–8.0)

## 2021-10-14 NOTE — [facility] Counselor (Signed)
Clinical Social Work Note  CSW contacted mother Natalie Brown [phone]) to inquire if she was able to submit guardianship paperwork at the courthouse on 6/22 as planned.  She did so, but has noted that the hearing is not scheduled on 01/09/2022 which she believes to be too long.  CSW informed her that she can request an emergency hearing be held so that a temporary guardian can be appointed, so she will go to the courthouse tomorrow to request this.  She was also confused about a Guardian ad Litem being appointed, but was able to comprehend the explanation of that person's role as a non-decision-maker who represents the patient in the hearing.  Mother was asked to contact weekday CSW to convey results of her request to move the guardianship hearing to a closer date.  Ambrose Mantle, LCSW 10/14/2021, 3:04 PM

## 2021-10-15 ENCOUNTER — Encounter (HOSPITAL_COMMUNITY): Payer: Self-pay

## 2021-10-15 DIAGNOSIS — F312 Bipolar disorder, current episode manic severe with psychotic features: Secondary | ICD-10-CM | POA: Diagnosis not present

## 2021-10-15 NOTE — Progress Notes (Addendum)
[facility] MD Progress Note  10/15/2021 5:57 PM Natalie Brown  MRN:  [id number] Subjective:   Natalie Brown is a 56 yr old female who presented on 5/17 to [facility] with complaints of anxiety and feeling overwhelmed and SI without a plan, she was admitted to [facility] on 5/19.  PPHx is significant for possible Bipolar Disorder.  Case was discussed in the multidisciplinary team. MAR was reviewed and patient was compliant with medications.  She received PRN Zyprexa for Agitation yesterday.  Psychiatric Team made the following recommendations yesterday: -Reduced Haldol to 5mg  qam and 10mg  qhs -Continue Cogentin to 0.5 mg BID -Invega Sustenna 234 mg IM given on 09-30-21 156mg  IM given 10/12/20 -Discontinue lactulose 20 g BID  On interview today patient reports she slept good last night.  She reports her appetite is doing good.  She reports no SI, HI, or AVH.  She reports no Ideas of Reference or other First Rank symptoms.  She reports not understanding why having we need blood work.  She reports no issues with her medications.  She asked to be discharged today.  Discussed with her that we do not think she is safe going home in her current state but she claims that she is.  She asked if her mom is filing for guardianship and we discussed that SW is working with her mother and APS regarding guardianship and medicaid application.  Discussed the importance of getting blood work and she stated that its #7 with her blood draws and she has never needed so much blood work at previous hospitals.  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  When asked why she was in the hospital she stated because she needed "help with her brain."  When asked what languages she speaks she lists [place], [place], and [place] and then also lists several other gibberish words.  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  She states  that she has 6 licenses in [place] and previously attended [employer].  She reports no other concerns at present.   Principal Problem: Bipolar affective disorder, current episode manic with psychotic symptoms (HCC) Diagnosis: Principal Problem:   Bipolar affective disorder, current episode manic with psychotic symptoms (HCC) Active Problems:   Increased ammonia level   Valproic acid causing adverse effect in therapeutic use   Altered mental status  Total Time spent with patient:  I personally spent 30 minutes on the unit in direct patient care. The direct patient care time included face-to-face time with the patient, reviewing the patient's chart, communicating with other professionals, and coordinating care. Greater than 50% of this time was spent in counseling or coordinating care with the patient regarding goals of hospitalization, psycho-education, and discharge planning needs.   Past Psychiatric History: Probable bipolar disorder.  Past Medical History:  Past Medical History:  Diagnosis Date   Bipolar 1 disorder with moderate mania (HCC) 12/30/2013   Hypertension    History reviewed. No pertinent surgical history. Family History: History reviewed. No pertinent family history. Family Psychiatric  History: Patient reports mental illness runs in her father's side of the family Social History:  Social History   Substance and Sexual Activity  Alcohol Use No     Social History   Substance and Sexual Activity  Drug Use No    Social History   Socioeconomic History   Marital status: Married  Spouse name: Not on file   Number of children: Not on file   Years of education: Not on file   Highest education level: Not on file  Occupational History   Not on file  Tobacco Use   Smoking status: Every Day   Smokeless tobacco: Current  Substance and Sexual Activity   Alcohol use: No   Drug use: No   Sexual activity: Not on file  Other Topics Concern   Not on file  Social  History Narrative   Not on file   Social Determinants of Health   Financial Resource Strain: Not on file  Food Insecurity: Not on file  Transportation Needs: Not on file  Physical Activity: Not on file  Stress: Not on file  Social Connections: Not on file    Sleep: Good  Appetite:  Good  Current Medications: Current Facility-Administered Medications  Medication Dose Route Frequency Provider Last Rate Last Admin   alum & mag hydroxide-simeth (MAALOX/MYLANTA) 200-200-20 MG/5ML suspension 30 mL  30 mL Oral Q4H PRN Massengill, Nathan, MD       benztropine (COGENTIN) tablet 0.5 mg  0.5 mg Oral BID Leone Haven, Vandana, MD   0.5 mg at 10/15/21 0915   feeding supplement (KATE FARMS STANDARD 1.4) liquid 325 mL  325 mL Oral BID BM Massengill, Nathan, MD   325 mL at 10/15/21 1349   fluticasone (FLONASE) 50 MCG/ACT nasal spray 1 spray  1 spray Each Nare Daily Massengill, Harrold Donath, MD   1 spray at 10/15/21 0916   haloperidol (HALDOL) tablet 10 mg  10 mg Oral QHS Mason Jim, Nazarene Bunning E, MD   10 mg at 10/14/21 2103   haloperidol (HALDOL) tablet 5 mg  5 mg Oral Daily Comer Locket, MD   5 mg at 10/15/21 6440   hydrocerin (EUCERIN) cream   Topical BID Phineas Inches, MD   Given at 10/15/21 1703   hydrOXYzine (ATARAX) tablet 25 mg  25 mg Oral TID PRN Phineas Inches, MD   25 mg at 09/10/21 1204   LORazepam (ATIVAN) tablet 1 mg  1 mg Oral Q6H PRN Bartholomew Crews E, MD   1 mg at 10/07/21 1546   magnesium hydroxide (MILK OF MAGNESIA) suspension 30 mL  30 mL Oral Daily PRN Massengill, Harrold Donath, MD       nicotine polacrilex (NICORETTE) gum 2 mg  2 mg Oral PRN Abbott Pao, Nadir, MD   2 mg at 09/22/21 0821   OLANZapine zydis (ZYPREXA) disintegrating tablet 5 mg  5 mg Oral Q8H PRN Bartholomew Crews E, MD   5 mg at 10/14/21 1416   And   ziprasidone (GEODON) injection 20 mg  20 mg Intramuscular PRN Comer Locket, MD        Lab Results: No results found for this or any previous visit (from the past 48 hour(s)).  Blood  Alcohol level:  Lab Results  Component Value Date   ETH <10 09/05/2021    Metabolic Disorder Labs: Lab Results  Component Value Date   HGBA1C 5.3 09/08/2021   MPG 105.41 09/08/2021   No results found for: "PROLACTIN" Lab Results  Component Value Date   CHOL 173 09/08/2021   TRIG 110 09/08/2021   HDL 50 09/08/2021   CHOLHDL 3.5 09/08/2021   VLDL 22 09/08/2021   LDLCALC 101 (H) 09/08/2021   LDLCALC 102 (H) 06/21/2020    Physical Findings: AIMS: Facial and Oral Movements Muscles of Facial Expression: None, normal Lips and Perioral Area: None, normal Jaw: None, normal Tongue:  None, normal,Extremity Movements Upper (arms, wrists, hands, fingers): None, normal Lower (legs, knees, ankles, toes): None, normal, Trunk Movements Neck, shoulders, hips: None, normal, Overall Severity Severity of abnormal movements (highest score from questions above): None, normal Incapacitation due to abnormal movements: None, normal Patient's awareness of abnormal movements (rate only patient's report): No Awareness, Dental Status Current problems with teeth and/or dentures?: No Does patient usually wear dentures?: No  CIWA:    COWS:     Musculoskeletal: Strength & Muscle Tone: within normal limits Gait & Station: normal Patient leans: N/A  Psychiatric Specialty Exam:  Presentation  General Appearance: casually dressed wearing multiple layers of clothes and hair bonnet  Eye Contact:Fair  Speech:Clear and Coherent  Speech Volume:Normal  Handedness:Right   Mood and Affect  Mood:anxious  Affect:Congruent   Thought Process  Thought Processes:ruminative about discharge planning; concrete, tangential and at times disorganized  Orientation:oriented to self, month, city and year but not upcoming holiday and not situation   Thought Content:Illogical; Tangential; Scattered No SI, HI, or AVH. No Ideas of Reference or First Rank symptoms.  Continues to have paranoia about need for  blood work and grandiose delusions  Hallucinations:Hallucinations: None  Ideas of Reference:Denied  Suicidal Thoughts:Suicidal Thoughts: No  Homicidal Thoughts:Homicidal Thoughts: No   Sensorium  Memory: Remote poor; recent fair  Judgment:Impaired  Insight:Lacking   Executive Functions  Concentration:Fair  Attention Span:Fair  Recall:Poor  Fund of Knowledge:Limited   Language:Good   Psychomotor Activity  Psychomotor Activity:Psychomotor Activity: Normal   Assets  Assets:Communication Skills; Resilience   Sleep  Total hours not recorded  Physical Exam Vitals and nursing note reviewed.  Constitutional:      General: [name] not in acute distress.    Appearance: Normal appearance. [name] normal weight. [name] not ill-appearing or toxic-appearing.  HENT:     Head: Normocephalic and atraumatic.  Pulmonary:     Effort: Pulmonary effort is normal.  Musculoskeletal:        General: Normal range of motion.  Neurological:     General: No focal deficit present.     Mental Status: [name] alert.    Review of Systems  Respiratory:  Negative for cough and shortness of breath.   Cardiovascular:  Negative for chest pain.  Gastrointestinal:  Negative for abdominal pain, constipation, diarrhea, nausea and vomiting.  Neurological:  Negative for dizziness, weakness and headaches.  Psychiatric/Behavioral:  Negative for depression, hallucinations and suicidal ideas. The patient is not nervous/anxious.    Blood pressure 112/68, pulse (!) 106, temperature 97.9 F (36.6 C), temperature source Oral, resp. rate 16, height 5\' 6"  (1.676 m), weight 72.6 kg, SpO2 100 %. Body mass index is 25.83 kg/m.   Treatment Plan Summary: Daily contact with patient to assess and evaluate symptoms and progress in treatment and Medication management  Natalie Brown is a 56 yr old female who presented on 5/17 to [facility] with complaints of anxiety and feeling overwhelmed and SI without a plan,  she was admitted to [facility] on 5/19.    Natalie Brown continues to be delusional and unable to hold organized conversations.  When asked about the languages she speaks she continues to list both real languages and gibberish ones.  We have again discussed the importance of obtaining blood work so will order CMP, CBC, and Ammonia for tomorrow.  We will not make any medication changes at this time.  Social Work will be reaching out to her mother to see if she has filled for Emergent Guardianship.  We will continue to monitor.    Bipolar I MRE manic with psychotic features (r/o schizoaffective d/o - bipolar type)             -- Zyprexa was discontinued secondary to lack of efficacy             -Reduced Haldol to 5 mg AM and 10 mg QHS for psychosis and mood stabilization - hope to taper down or off as tolerated now that she is loaded with Gean Birchwood.             -Continue Cogentin to 0.5 mg twice daily (decreased recently in event anticholinergic med has contributed to recent confusion)             Hinda Glatter Sustenna 234 mg IM given on 09-30-21 156mg  IM given 10/12/20 Lipid Panel: WNL except for LDL 101; HbgA1c: 5.3; QTc:454ms - repeating EKG for monitoring of QTC-ordered and pending when she will cooperate             -Depakote tapered and stopped given recent hyperammonemia - Ammonia trending down 48 and refused repeat lab today; CBC, LFT on 6/20 within normal limits              -- Ativan discontinued to limit sedative agents.             -- Zyprexa/Geodon/ativan agitation protocol             -- Encouraged patient to participate in unit milieu and in scheduled group therapies             -Additional psychosis labs (HIV- , RPR-, ANA-, heavy metal WNL, ESR wnl, ceruloplasmin wnl and CT head shows Marked for age brain atrophy.  No acute or reversible finding.)     Hyperammonemia: -Repeat ammonia level was trending down with last draw -Currently not compliant with blood draws for a repeat level. -Depakote was  stopped   Mildly elevated creatinine  -Creatinine downtrending to 1.20 from 1.35 -  po hydration encouraged.  -We will keep on trending as she will cooperate for labs    Season allergies -Continue Claritin 10mg  daily PRN   Urinary frequency -repeat UA with rare bacteria, but other wise, WNL  Lauro Franklin, MD 10/15/2021, 5:57 PM

## 2021-10-16 DIAGNOSIS — F3112 Bipolar disorder, current episode manic without psychotic features, moderate: Secondary | ICD-10-CM | POA: Diagnosis not present

## 2021-10-16 NOTE — Group Note (Signed)
Recreation Therapy Group Note   Group Topic:Team Building  Group Date: 10/16/2021 Start Time: 1002 End Time: 1030 Facilitators: Caroll Rancher, LRT,CTRS Location: [address] Dayroom   Goal Area(s) Addresses:  Patient will effectively work with peer towards shared goal.  Patient will identify skills used to make activity successful.  Patient will share challenges and verbalize solution-driven approaches used. Patient will identify how skills used during activity can be used to reach post d/c goals.   Group Description:  [employer]. Patients were provided the following materials: 5 drinking straws, 5 rubber bands, 5 paper clips, 2 index cards and 2 drinking cups. Using the provided materials patients were asked to build a launching mechanism to launch a ping pong ball across the room, approximately 10 feet. Patients were divided into teams of 3-5. Instructions required all materials be incorporated into the device, functionality of items left to the peer group's discretion.   Affect/Mood: Flat   Participation Level: Moderate   Participation Quality: Independent   Behavior: Appropriate and Distracted   Speech/Thought Process: Distracted   Insight: Poor   Judgement: Poor   Modes of Intervention: STEM Activity   Patient Response to Interventions:  Engaged   Education Outcome:  Acknowledges education and In group clarification offered    Clinical Observations/Individualized Feedback: Pt was attentive and interactive.  Pt was able to focus more but would get off track talking about wanting to go home after hearing some of her peers talking about their discharge dates.  Pt would get back on track and attempt to help peers put launcher together.      Plan: Continue to engage patient in RT group sessions 2-3x/week.   Caroll Rancher, LRT,CTRS 10/16/2021 12:30 PM

## 2021-10-16 NOTE — [facility] Group Notes (Signed)
Adult Orientation Group Note  Date:  10/16/2021 Time:  2:34 PM  Group Topic/Focus:  Orientation:   The focus of this group is to educate the patient on the purpose and policies of crisis stabilization and provide a format to answer questions about their admission.  The group details unit policies and expectations of patients while admitted.  Participation Level:  Did Not Attend   [name] [name] 10/16/2021, 2:34 PM 

## 2021-10-17 ENCOUNTER — Inpatient Hospital Stay: Payer: Self-pay | Admitting: Family Medicine

## 2021-10-17 DIAGNOSIS — F3112 Bipolar disorder, current episode manic without psychotic features, moderate: Secondary | ICD-10-CM | POA: Diagnosis not present

## 2021-10-17 NOTE — Progress Notes (Signed)
   10/17/21 0530  Sleep  Number of Hours 7.5

## 2021-10-17 NOTE — Progress Notes (Signed)
Adult Psychoeducational Group Note  Date:  10/17/2021 Time:  8:29 PM  Group Topic/Focus:  Wrap-Up Group:   The focus of this group is to help patients review their daily goal of treatment and discuss progress on daily workbooks.  Participation Level:  Active  Participation Quality:  Appropriate  Affect:  Appropriate  Cognitive:  Appropriate  Insight: Appropriate  Engagement in Group:  Engaged  Modes of Intervention:  Activity and Discussion  Additional Comments:  Pt stated her goal for the day was to talk to the doctor.  Pt goal was met.  Tonia Brooms D 10/17/2021, 8:29 PM

## 2021-10-17 NOTE — [facility] Group Notes (Signed)
Adult Psychoeducational Group Note  Date:  10/17/2021 Time:  1:24 PM  Group Topic/Focus:  Orientation:   The focus of this group is to educate the patient on the purpose and policies of crisis stabilization and provide a format to answer questions about their admission.  The group details unit policies and expectations of patients while admitted.  Participation Level:  Active  Participation Quality:  Appropriate  Affect:  Depressed  Cognitive:  Appropriate  Insight: Appropriate  Engagement in Group:  Engaged  Modes of Intervention:  Discussion  Additional Comments:   Patient attended morning orientation group and participated.    Nashali Ditmer W Mikiya Nebergall 10/17/2021, 1:24 PM

## 2021-10-17 NOTE — Progress Notes (Addendum)
[facility] MD Progress Note  10/17/2021 4:37 PM [name]  MRN:  [id number] Subjective:   Natalie Brown is a 56 yr old female who presented on 5/17 to [facility] with complaints of anxiety and feeling overwhelmed and SI without a plan, she was admitted to [facility] on 5/19.  PPHx is significant for possible Bipolar Disorder.   Case was discussed in the multidisciplinary team. MAR was reviewed and patient was compliant with medications.  She received no PRN medications yesterday.   Psychiatric Team made the following recommendations yesterday: -Continue Haldol 5 mg AM and 10 mg QHS -Continue Cogentin to 0.5 mg BID -Invega Sustenna 234 mg IM given on 09-30-21 185m IM given 10/12/20    On interview today patient reports she slept good last night.  She reports her appetite is doing good.  She reports no SI, HI, or AVH.  She reports no P[place]oia, Ideas of Reference, or other First Rank symptoms.  She reports no issues with her medications.  She continues to focus on why she is here still while others get to discharge.  Explained to her multiple times that we are continuing to work with social work and her mother in finding placement for her.  When asked if she got her blood work she states that she had and initially states they took 2 pints from her.  When told that it had been reported from nursing that she had not gotten the blood work she continued to insist that she had.  Discussed the importance of getting the blood work and the need to monitor her ammonia levels and kidney function.  She reports concerns present time   Principal Problem: Bipolar affective disorder, current episode manic with psychotic symptoms ([place] Diagnosis: Principal Problem:   Bipolar affective disorder, current episode manic with psychotic symptoms ([place] Active Problems:   Increased ammonia level   Valproic acid causing adverse effect in therapeutic use   Altered mental status  Total Time spent with patient:  I personally  spent 30 minutes on the unit in direct patient care. The direct patient care time included face-to-face time with the patient, reviewing the patient's chart, communicating with other professionals, and coordinating care. Greater than 50% of this time was spent in counseling or coordinating care with the patient regarding goals of hospitalization, psycho-education, and discharge planning needs.   Past Psychiatric History: Probable bipolar disorder  Past Medical History:  Past Medical History:  Diagnosis Date   Bipolar 1 disorder with moderate mania ([facility] 12/30/2013   Hypertension    History reviewed. No pertinent surgical history. Family History: History reviewed. No pertinent family history. Family Psychiatric  History: Patient reports mental illness runs in her father's side of the family Social History:  Social History   Substance and Sexual Activity  Alcohol Use No     Social History   Substance and Sexual Activity  Drug Use No    Social History   Socioeconomic History   Marital status: Married    Spouse name: Not on file   Number of children: Not on file   Years of education: Not on file   Highest education level: Not on file  Occupational History   Not on file  Tobacco Use   Smoking status: Every Day   Smokeless tobacco: Current  Substance and Sexual Activity   Alcohol use: No   Drug use: No   Sexual activity: Not on file  Other Topics Concern   Not on file  Social History Narrative  Not on file   Social Determinants of Health   Financial Resource Strain: Not on file  Food Insecurity: Not on file  Transportation Needs: Not on file  Physical Activity: Not on file  Stress: Not on file  Social Connections: Not on file   Additional Social History:                         Sleep: Good  Appetite:  Good  Current Medications: Current Facility-Administered Medications  Medication Dose Route Frequency Provider Last Rate Last Admin   alum & mag  hydroxide-simeth (MAALOX/MYLANTA) 200-200-20 MG/5ML suspension 30 mL  30 mL Oral Q4H PRN Massengill, Nathan, MD       benztropine (COGENTIN) tablet 0.5 mg  0.5 mg Oral BID Rosita Kea, Vandana, MD   0.5 mg at 10/17/21 0818   feeding supplement (KATE FARMS STANDARD 1.4) liquid 325 mL  325 mL Oral BID BM Massengill, Nathan, MD   325 mL at 10/17/21 1008   fluticasone (FLONASE) 50 MCG/ACT nasal spray 1 spray  1 spray Each Nare Daily Massengill, [name], MD   1 spray at 10/17/21 0818   haloperidol (HALDOL) tablet 10 mg  10 mg Oral QHS Nelda Marseille, Amy E, MD   10 mg at 10/16/21 2015   haloperidol (HALDOL) tablet 5 mg  5 mg Oral Daily Viann Fish E, MD   5 mg at 10/17/21 0814   hydrocerin (EUCERIN) cream   Topical BID Janine Limbo, MD   1 Application at 48/18/56 0819   hydrOXYzine (ATARAX) tablet 25 mg  25 mg Oral TID PRN Janine Limbo, MD   25 mg at 09/10/21 1204   LORazepam (ATIVAN) tablet 1 mg  1 mg Oral Q6H PRN Viann Fish E, MD   1 mg at 10/07/21 1546   magnesium hydroxide (MILK OF MAGNESIA) suspension 30 mL  30 mL Oral Daily PRN Massengill, [name], MD       nicotine polacrilex (NICORETTE) gum 2 mg  2 mg Oral PRN Winfred Leeds, Nadir, MD   2 mg at 09/22/21 0821   OLANZapine zydis (ZYPREXA) disintegrating tablet 5 mg  5 mg Oral Q8H PRN Viann Fish E, MD   5 mg at 10/14/21 1416   And   ziprasidone (GEODON) injection 20 mg  20 mg Intramuscular PRN Harlow Asa, MD        Lab Results: No results found for this or any previous visit (from the past 18 hour(s)).  Blood Alcohol level:  Lab Results  Component Value Date   ETH <10 31/49/7026    Metabolic Disorder Labs: Lab Results  Component Value Date   HGBA1C 5.3 09/08/2021   MPG 105.41 09/08/2021   No results found for: "PROLACTIN" Lab Results  Component Value Date   CHOL 173 09/08/2021   TRIG 110 09/08/2021   HDL 50 09/08/2021   CHOLHDL 3.5 09/08/2021   VLDL 22 09/08/2021   LDLCALC 101 (H) 09/08/2021   LDLCALC 102 (H) 06/21/2020     Physical Findings: AIMS: Facial and Oral Movements Muscles of Facial Expression: None, normal Lips and Perioral Area: None, normal Jaw: None, normal Tongue: None, normal,Extremity Movements Upper (arms, wrists, hands, fingers): None, normal Lower (legs, knees, ankles, toes): None, normal, Trunk Movements Neck, shoulders, hips: None, normal, Overall Severity Severity of abnormal movements (highest score from questions above): None, normal Incapacitation due to abnormal movements: None, normal Patient's awareness of abnormal movements (rate only patient's report): No Awareness, Dental Status Current problems with teeth  and/or dentures?: No Does patient usually wear dentures?: No  CIWA:    COWS:     Musculoskeletal: Strength & Muscle Tone: within normal limits Gait & Station: normal Patient leans: N/A  Psychiatric Specialty Exam:  Presentation  General Appearance: Casual  Eye Contact:Fair  Speech:Clear and Coherent; Normal Rate  Speech Volume:Normal  Handedness:Right   Mood and Affect  Mood:Dysphoric  Affect:Congruent; Appropriate   Thought Process  Thought Processes:Disorganized  Descriptions of Associations:Tangential  Orientation:Partial  Thought Content:Illogical; Tangential; Scattered (Ruminates on Discharge and still being admitted) No SI, HI, or AVH. No Paranoia, Ideas of Reference, or First Rank symptoms.  History of Schizophrenia/Schizoaffective disorder:Yes  Duration of Psychotic Symptoms:Less than six months  Hallucinations:Hallucinations: None  Ideas of Reference:None  Suicidal Thoughts:Suicidal Thoughts: No  Homicidal Thoughts:Homicidal Thoughts: No   Sensorium  Memory:Immediate Fair; Recent Fair  Judgment:Impaired  Insight:Lacking   Executive Functions  Concentration:Fair  Attention Span:Fair  [place]   Psychomotor Activity  Psychomotor Activity:Psychomotor Activity:  Normal   Assets  Assets:Communication Skills; Resilience   Sleep  Sleep:Sleep: Good Number of Hours of Sleep: 7.5    Physical Exam: Physical Exam Vitals and nursing note reviewed.  Constitutional:      General: [name] not in acute distress.    Appearance: Normal appearance. [name] normal weight. [name] not ill-appearing or toxic-appearing.  HENT:     Head: Normocephalic and atraumatic.  Pulmonary:     Effort: Pulmonary effort is normal.  Musculoskeletal:        General: Normal range of motion.  Neurological:     General: No focal deficit present.     Mental Status: [name] alert.    Review of Systems  Respiratory:  Negative for cough and shortness of breath.   Cardiovascular:  Negative for chest pain.  Gastrointestinal:  Negative for abdominal pain, constipation, diarrhea, nausea and vomiting.  Neurological:  Negative for dizziness, weakness and headaches.  Psychiatric/Behavioral:  Negative for depression, hallucinations and suicidal ideas. The patient is not nervous/anxious.    Blood pressure 121/67, pulse 99, temperature 98.5 F (36.9 C), temperature source Oral, resp. rate 16, height 5' 6"  (1.676 m), weight 72.6 kg, SpO2 100 %. Body mass index is 25.83 kg/m.   Treatment Plan Summary: Daily contact with patient to assess and evaluate symptoms and progress in treatment and Medication management  Shefali Ng is a 56 yr old female who presented on 5/17 to [facility] with complaints of anxiety and feeling overwhelmed and SI without a plan, she was admitted to [facility] on 5/19.      Anoushka is still ruminating on other patients being discharged and her continuing to be here.  She continues to have tangential/scattered thinking and repeats her self often during interview.  The repeat blood work is still ordered and nursing will continue to attempt to get it drawn.  We will not make any medication changes at this time.  There is some concern she may be decompensating due to the  stopping of her Depakote, she may need another mood stabilizer in the future, will consider Trileptal.  We will continue to monitor.      Bipolar I MRE manic with psychotic features (r/o schizoaffective d/o - bipolar type)             -- Zyprexa was discontinued secondary to lack of efficacy             -Continue Haldol 5 mg AM and 10 mg  QHS for psychosis and mood stabilization - hope to taper down or off as tolerated now that she is loaded with Kirt Boys.             -Continue Cogentin 0.5 mg twice daily (decreased recently in event anticholinergic med has contributed to recent confusion)             Lorayne Bender Sustenna 234 mg IM given on 09-30-21 162m IM given 10/12/20 Lipid Panel: WNL except for LDL 101; HbgA1c: 5.3; QTc:4161m- repeating EKG for monitoring of QTC-ordered and pending when she will cooperate             -Depakote tapered and stopped given recent hyperammonemia - Ammonia trending down 48 and refused repeat lab today; CBC, LFT on 6/20 within normal limits              -- Ativan discontinued to limit sedative agents.             -- Zyprexa/Geodon/ativan agitation protocol            -Additional psychosis labs (HIV- , RPR-, ANA-, heavy metal WNL, ESR wnl, ceruloplasmin wnl and CT head shows Marked for age brain atrophy.  No acute or reversible finding.)       Hyperammonemia: -Repeat ammonia level was trending down with last draw -Currently not compliant with blood draws for a repeat level. -Depakote was stopped     Mildly elevated creatinine  -Creatinine downtrending to 1.20 from 1.35 -  po hydration encouraged.  -We will keep on trending as she will cooperate for labs     Season allergies -Continue Claritin 1083maily PRN     Urinary frequency -repeat UA with rare bacteria, but other wise, WNL   AleBriant CedarD 10/17/2021, 4:37 PM

## 2021-10-17 NOTE — Progress Notes (Signed)
Writer tries to educate pt on importance of getting lab work done, pt continues to be confused and paranoid about her getting labs done.     10/17/21 2130  Psych Admission Type (Psych Patients Only)  Admission Status Involuntary  Psychosocial Assessment  Patient Complaints Confusion  Eye Contact Fair  Facial Expression Anxious  Affect Anxious;Preoccupied  Speech Slurred  Interaction Assertive  Motor Activity Slow  Appearance/Hygiene Layered clothes  Behavior Characteristics Cooperative  Mood Suspicious;Anxious;Pleasant  Aggressive Behavior  Effect No apparent injury  Thought Process  Coherency Disorganized  Content Preoccupation  Delusions None reported or observed  Perception Derealization  Hallucination None reported or observed  Judgment Impaired  Confusion None  Danger to Self  Current suicidal ideation? Denies  Danger to Others  Danger to Others None reported or observed

## 2021-10-17 NOTE — Progress Notes (Signed)
Pt A & O to self and place. Denies SI, HI, AVH and pain when assessed. Observed in scheduled groups, participates minimally but was appropriate. Remains confused, disorganized with multiple requests for discharge and storage run, however, she's verbally redirectable. She refused labs this evening on multiple approaches. Pt walked off unit with writer and came back to unit. Per pt Y'all not drawing no blood. I've given y'all 2 pints of blood yesterday. Y'all not CSL and biolife. They pay me hundreds of dollars for my blood. That's way too much blood. I swear to God, I already give you blood yesterday". Emotional support, encouragement and reassurance provided to pt this shift. All  medications administered with verbal education and effects monitored. Safety checks maintained without incident. Pt tolerates all meals, fluids and medications well. Continues to need verbal redirections due to disorganized state.

## 2021-10-17 NOTE — Group Note (Signed)
[facility] LCSW Group Therapy Note  Date/Time:10/17/2021 @  Type of Therapy and Topic:  Group Therapy:  Communication  Participation Level:  Active  Description of Group:    In this group patients will be encouraged to explore how individuals communicate with one another appropriately and inappropriately. Patients will be guided to discuss their thoughts, feelings, and behaviors related to barriers communicating feelings, needs, and stressors. The group will process together ways to execute positive and appropriate communications, with attention given to how one use behavior, tone, and body language to communicate. Patient will be encouraged to reflect on an incident where they were successfully able to communicate and the factors that they believe helped them to communicate. Each patient will be encouraged to identify specific changes they are motivated to make in order to overcome communication barriers with self, peers, authority, and parents. This group will be process-oriented, with patients participating in exploration of their own experiences as well as giving and receiving support and challenging self as well as other group members.  Therapeutic Goals: Patient will identify how people communicate (body language, facial expression, and electronics) Also discuss tone, voice and how these impact what is communicated and how the message is perceived.  Patient will identify feelings (such as fear or worry), thought process and behaviors related to why people internalize feelings rather than express self openly. Patient will identify two changes they are willing to make to overcome communication barriers. Members will then practice through Role Play how to communicate by utilizing psycho-education material (such as I Feel statements and acknowledging feelings rather than displacing on others)   Summary of Patient Progress: Patient participated appropriately in group.  Patient was easily redirected when  getting off topic.  She reports that she has been using assertive communication while being a patient here and to advocate about her discharge and when to get labs. Patient participated in discussion around different communication styles and how to effectively communicate.     Therapeutic Modalities:   Cognitive Behavioral Therapy Solution Focused Therapy Motivational Interviewing Family Systems Approach

## 2021-10-18 DIAGNOSIS — F3112 Bipolar disorder, current episode manic without psychotic features, moderate: Secondary | ICD-10-CM | POA: Diagnosis not present

## 2021-10-18 NOTE — Progress Notes (Signed)
   10/18/21 0530  Sleep  Number of Hours 9.5

## 2021-10-18 NOTE — [facility] Counselor (Signed)
CSW spoke with Onnie Boer from [employer] regarding assisting patient with applying for disability.  He discussed coming out to see patient next week to go over some paperwork.  CSW agreed to follow up on Monday regarding setting up a time for patient and caseworker to meet.  Caseworker also asked for contact information of patient APS worker.  CSW agreed to call him back with that information. Contact information for [employer] caseworker below:  Onnie Boer- [phone] ext [phone].    Melven Stockard, LCSW, LCAS Clincal Social Worker  [facility]

## 2021-10-18 NOTE — Progress Notes (Signed)
[facility] MD Progress Note  10/18/2021 11:30 AM Natalie Brown  MRN:  [id number] Subjective:   Natalie Brown is a 56 yr old female who presented on 5/17 to [facility] with complaints of anxiety and feeling overwhelmed and SI without a plan, she was admitted to [facility] on 5/19.  PPHx is significant for possible Bipolar Disorder.   Case was discussed in the multidisciplinary team. MAR was reviewed and patient was compliant with medications.  She did not receive any PRN medications yesterday.   Psychiatric Team made the following recommendations yesterday: -Continue Haldol 5 mg AM and 10 mg QHS -Continue Cogentin to 0.5 mg BID -Invega Sustenna 234 mg IM given on 09-30-21 170m IM given 10/12/20   On interview today patient reports she slept good last night.  She reports her appetite is doing good.  She reports no SI, HI, or AVH.  She reports no Parnoia, Ideas of Reference, or other First Rank symptoms.  She reports no issues with her medications.  She asks multiple times during the interview why she is still admitted to the hospital when many other patients have come and gone.  Discussed with her multiple times that we are having to await Medicare/Medicaid and getting a group home.    Discussed with her that while she did go to the lab the other day they were not able to get the blood work.  Discussed the importance of getting this lab work and if she would agree to trying again.  She states she is willing to attempt again.    She reports no other concerns at present.    Principal Problem: Bipolar affective disorder, current episode manic with psychotic symptoms ([place] Diagnosis: Principal Problem:   Bipolar affective disorder, current episode manic with psychotic symptoms ([facility] Active Problems:   Increased ammonia level   Valproic acid causing adverse effect in therapeutic use   Altered mental status  Total Time spent with patient:  I personally spent 30 minutes on the unit in direct patient care.  The direct patient care time included face-to-face time with the patient, reviewing the patient's chart, communicating with other professionals, and coordinating care. Greater than 50% of this time was spent in counseling or coordinating care with the patient regarding goals of hospitalization, psycho-education, and discharge planning needs.   Past Psychiatric History: Probable bipolar disorder  Past Medical History:  Past Medical History:  Diagnosis Date   Bipolar 1 disorder with moderate mania [place]   Hypertension    History reviewed. No pertinent surgical history. Family History: History reviewed. No pertinent family history. Family Psychiatric  History: Patient reports mental illness runs in her father's side of the family Social History:  Social History   Substance and Sexual Activity  Alcohol Use No     Social History   Substance and Sexual Activity  Drug Use No    Social History   Socioeconomic History   Marital status: Married    Spouse name: Not on file   Number of children: Not on file   Years of education: Not on file   Highest education level: Not on file  Occupational History   Not on file  Tobacco Use   Smoking status: Every Day   Smokeless tobacco: Current  Substance and Sexual Activity   Alcohol use: No   Drug use: No   Sexual activity: Not on file  Other Topics Concern   Not on file  Social History Narrative   Not on file  Social Determinants of Health   Financial Resource Strain: Not on file  Food Insecurity: Not on file  Transportation Needs: Not on file  Physical Activity: Not on file  Stress: Not on file  Social Connections: Not on file   Additional Social History:                         Sleep: Good  Appetite:  Good  Current Medications: Current Facility-Administered Medications  Medication Dose Route Frequency Provider Last Rate Last Admin   alum & mag hydroxide-simeth (MAALOX/MYLANTA) 200-200-20 MG/5ML  suspension 30 mL  30 mL Oral Q4H PRN Massengill, Nathan, MD       benztropine (COGENTIN) tablet 0.5 mg  0.5 mg Oral BID Armando Reichert, MD   0.5 mg at 10/18/21 1017   feeding supplement (KATE FARMS STANDARD 1.4) liquid 325 mL  325 mL Oral BID BM Massengill, Nathan, MD   325 mL at 10/17/21 1405   fluticasone (FLONASE) 50 MCG/ACT nasal spray 1 spray  1 spray Each Nare Daily Massengill, [name], MD   1 spray at 10/18/21 5102   haloperidol (HALDOL) tablet 10 mg  10 mg Oral QHS Nelda Marseille, Amy E, MD   10 mg at 10/17/21 2037   haloperidol (HALDOL) tablet 5 mg  5 mg Oral Daily Harlow Asa, MD   5 mg at 10/18/21 5852   hydrocerin (EUCERIN) cream   Topical BID Janine Limbo, MD   1 Application at 77/82/42 0813   hydrOXYzine (ATARAX) tablet 25 mg  25 mg Oral TID PRN Janine Limbo, MD   25 mg at 09/10/21 1204   LORazepam (ATIVAN) tablet 1 mg  1 mg Oral Q6H PRN Viann Fish E, MD   1 mg at 10/07/21 1546   magnesium hydroxide (MILK OF MAGNESIA) suspension 30 mL  30 mL Oral Daily PRN Massengill, [name], MD       nicotine polacrilex (NICORETTE) gum 2 mg  2 mg Oral PRN Winfred Leeds, Nadir, MD   2 mg at 09/22/21 0821   OLANZapine zydis (ZYPREXA) disintegrating tablet 5 mg  5 mg Oral Q8H PRN Viann Fish E, MD   5 mg at 10/14/21 1416   And   ziprasidone (GEODON) injection 20 mg  20 mg Intramuscular PRN Harlow Asa, MD        Lab Results: No results found for this or any previous visit (from the past 14 hour(s)).  Blood Alcohol level:  Lab Results  Component Value Date   ETH <10 35/36/1443    Metabolic Disorder Labs: Lab Results  Component Value Date   HGBA1C 5.3 09/08/2021   MPG 105.41 09/08/2021   No results found for: "PROLACTIN" Lab Results  Component Value Date   CHOL 173 09/08/2021   TRIG 110 09/08/2021   HDL 50 09/08/2021   CHOLHDL 3.5 09/08/2021   VLDL 22 09/08/2021   LDLCALC 101 (H) 09/08/2021   LDLCALC 102 (H) 06/21/2020    Physical Findings: AIMS: Facial and Oral  Movements Muscles of Facial Expression: None, normal Lips and Perioral Area: None, normal Jaw: None, normal Tongue: None, normal,Extremity Movements Upper (arms, wrists, hands, fingers): None, normal Lower (legs, knees, ankles, toes): None, normal, Trunk Movements Neck, shoulders, hips: None, normal, Overall Severity Severity of abnormal movements (highest score from questions above): None, normal Incapacitation due to abnormal movements: None, normal Patient's awareness of abnormal movements (rate only patient's report): No Awareness, Dental Status Current problems with teeth and/or dentures?: No Does patient  usually wear dentures?: No  CIWA:    COWS:     Musculoskeletal: Strength & Muscle Tone: within normal limits Gait & Station: normal Patient leans: N/A  Psychiatric Specialty Exam:  Presentation  General Appearance: Appropriate for Environment; Casual  Eye Contact:Good  Speech:Clear and Coherent; Normal Rate  Speech Volume:Normal  Handedness:Right   Mood and Affect  Mood:Dysphoric  Affect:Congruent; Appropriate   Thought Process  Thought Processes:Disorganized  Descriptions of Associations:Tangential  Orientation:Partial  Thought Content:Illogical; Tangential; Scattered (Perseverates on discharge) No SI, HI, or AVH. No Paranoia, Ideas of Reference, or First Rank symptoms.  History of Schizophrenia/Schizoaffective disorder:Yes  Duration of Psychotic Symptoms:Less than six months  Hallucinations:Hallucinations: None  Ideas of Reference:None  Suicidal Thoughts:Suicidal Thoughts: No  Homicidal Thoughts:Homicidal Thoughts: No   Sensorium  Memory:Immediate Fair; Recent Fair  Judgment:Impaired  Insight:Lacking   Executive Functions  Concentration:Fair  Attention Span:Fair  [place]   Psychomotor Activity  Psychomotor Activity:Psychomotor Activity: Normal   Assets  Assets:Communication  Skills; Resilience   Sleep  Sleep:Sleep: Good Number of Hours of Sleep: 9.5    Physical Exam: Physical Exam Vitals and nursing note reviewed.  Constitutional:      General: [name] not in acute distress.    Appearance: Normal appearance. [name] normal weight. [name] not ill-appearing or toxic-appearing.  HENT:     Head: Normocephalic and atraumatic.  Pulmonary:     Effort: Pulmonary effort is normal.  Musculoskeletal:        General: Normal range of motion.  Neurological:     General: No focal deficit present.     Mental Status: [name] alert.    Review of Systems  Respiratory:  Negative for cough and shortness of breath.   Cardiovascular:  Negative for chest pain.  Gastrointestinal:  Negative for abdominal pain, constipation, diarrhea, nausea and vomiting.  Neurological:  Negative for dizziness, weakness and headaches.  Psychiatric/Behavioral:  Negative for depression, hallucinations and suicidal ideas. The patient is not nervous/anxious.    Blood pressure 107/73, pulse 96, temperature 97.6 F (36.4 C), temperature source Oral, resp. rate 18, height 5' 6"  (1.676 m), weight 72.6 kg, SpO2 100 %. Body mass index is 25.83 kg/m.   Treatment Plan Summary: Daily contact with patient to assess and evaluate symptoms and progress in treatment and Medication management  Jacole Capley is a 56 yr old female who presented on 5/17 to [facility] with complaints of anxiety and feeling overwhelmed and SI without a plan, she was admitted to [facility] on 5/19.      Saia continues to perseverate on continuing to be in the hospital while others are discharged.  Discussed with her that they were unable to get lab work the other evening despite being taken to the lab room and it being attempted.  [name] will to attempt again tomorrow morning so we will order the labs.  She continues to be unable to take care of herself.  This morning she was arguing with nursing staff about not getting Depakote despite it  being stopped several days ago because of hyperammoniemia and this being described to her multiple times. We will continue to monitor.      Bipolar I MRE manic with psychotic features (r/o schizoaffective d/o - bipolar type)             -- Zyprexa was discontinued secondary to lack of efficacy             -Continue Haldol 5  mg AM and 10 mg QHS for psychosis and mood stabilization - hope to taper down or off as tolerated now that she is loaded with Kirt Boys.             -Continue Cogentin 0.5 mg twice daily (decreased recently in event anticholinergic med has contributed to recent confusion)             Lorayne Bender Sustenna 234 mg IM given on 09-30-21 175m IM given 10/12/20 Lipid Panel: WNL except for LDL 101; HbgA1c: 5.3; QTc:4134m- repeating EKG for monitoring of QTC-ordered and pending when she will cooperate             -Depakote tapered and stopped given recent hyperammonemia - Ammonia trending down 48 and refused repeat lab today; CBC, LFT on 6/20 within normal limits              -- Ativan discontinued to limit sedative agents.             -- Zyprexa/Geodon/ativan agitation protocol            -Additional psychosis labs (HIV- , RPR-, ANA-, heavy metal WNL, ESR wnl, ceruloplasmin wnl and CT head shows Marked for age brain atrophy.  No acute or reversible finding.)       Hyperammonemia: -Repeat ammonia level was trending down with last draw -Currently not compliant with blood draws for a repeat level. -Depakote was stopped     Mildly elevated creatinine  -Creatinine downtrending to 1.20 from 1.35 -  po hydration encouraged.  -We will keep on trending as she will cooperate for labs     Season allergies -Continue Claritin 1035maily PRN     Urinary frequency -repeat UA with rare bacteria, but other wise, WNL   AleBriant CedarD 10/18/2021, 11:30 AM

## 2021-10-18 NOTE — Progress Notes (Signed)
The focus of this group is to help patients review their daily goal of treatment and discuss progress on daily workbooks. Pt did not attend the evening group. 

## 2021-10-18 NOTE — Group Note (Signed)
Recreation Therapy Group Note   Group Topic:Self-Esteem  Group Date: 10/18/2021 Start Time: 1005 End Time: 1045 Facilitators: Caroll Rancher, LRT,CTRS Location: [address] Dayroom   Goal Area(s) Addresses:  Patient will identify and write at least one positive trait about themself. Patient will successfully identify influential people in their life and why they admire them. Patient will acknowledge the benefit of healthy self-esteem. Patient will endorse understanding of ways to increase self-esteem.   Group Description:   LRT began group session with open dialogue asking the patients to define self-esteem and verbally identify positive qualities and traits people may possess. Patients were then instructed to design a personalized license plate, with words and drawings, representing at least 3 positive things about themselves. Pts were encouraged to include favorites, things they are proud of, what they enjoy doing, and dreams for their future. If a patient had a life motto or a meaningful phase that expressed their life values, pt's were asked to incorporate that into their design as well. Patients were given the opportunity to share their completed work with the group.    Affect/Mood: N/A   Participation Level: Did not attend    Clinical Observations/Individualized Feedback:     Plan: Continue to engage patient in RT group sessions 2-3x/week.   Caroll Rancher, LRT,CTRS 10/18/2021 12:10 PM

## 2021-10-18 NOTE — Progress Notes (Signed)
   10/18/21 2100  Psych Admission Type (Psych Patients Only)  Admission Status Involuntary  Psychosocial Assessment  Patient Complaints Confusion  Eye Contact Fair  Facial Expression Anxious  Affect Anxious;Preoccupied  Speech Slurred  Interaction Assertive  Motor Activity Slow  Appearance/Hygiene Layered clothes  Behavior Characteristics Cooperative  Mood Anxious  Aggressive Behavior  Effect No apparent injury  Thought Process  Coherency Disorganized  Content Preoccupation  Delusions None reported or observed  Perception Derealization  Hallucination None reported or observed  Judgment Impaired  Confusion None  Danger to Self  Current suicidal ideation? Denies  Danger to Others  Danger to Others None reported or observed

## 2021-10-19 ENCOUNTER — Encounter (HOSPITAL_COMMUNITY): Payer: Self-pay

## 2021-10-19 DIAGNOSIS — F3112 Bipolar disorder, current episode manic without psychotic features, moderate: Secondary | ICD-10-CM | POA: Diagnosis not present

## 2021-10-19 LAB — COMPREHENSIVE METABOLIC PANEL
ALT: 14 U/L (ref 0–44)
AST: 17 U/L (ref 15–41)
Albumin: 3.5 g/dL (ref 3.5–5.0)
Alkaline Phosphatase: 50 U/L (ref 38–126)
Anion gap: 9 (ref 5–15)
BUN: 19 mg/dL (ref 6–20)
CO2: 23 mmol/L (ref 22–32)
Calcium: 8.6 mg/dL — ABNORMAL LOW (ref 8.9–10.3)
Chloride: 107 mmol/L (ref 98–111)
Creatinine, Ser: 1.04 mg/dL — ABNORMAL HIGH (ref 0.44–1.00)
GFR, Estimated: >60 mL/min (ref >60)
Glucose, Bld: 103 mg/dL — ABNORMAL HIGH (ref 70–99)
Potassium: 4.3 mmol/L (ref 3.5–5.1)
Sodium: 139 mmol/L (ref 135–145)
Total Bilirubin: 0.4 mg/dL (ref 0.3–1.2)
Total Protein: 7.1 g/dL (ref 6.5–8.1)

## 2021-10-19 LAB — CBC WITH DIFFERENTIAL/PLATELET
Abs Immature Granulocytes: 0.01 10*3/uL (ref 0.00–0.07)
Basophils Absolute: 0.1 10*3/uL (ref 0.0–0.1)
Basophils Relative: 1 %
Eosinophils Absolute: 0.4 10*3/uL (ref 0.0–0.5)
Eosinophils Relative: 7 %
HCT: 39.7 % (ref 36.0–46.0)
Hemoglobin: 12.8 g/dL (ref 12.0–15.0)
Immature Granulocytes: 0 %
Lymphocytes Relative: 36 %
Lymphs Abs: 1.8 10*3/uL (ref 0.7–4.0)
MCH: 30.3 pg (ref 26.0–34.0)
MCHC: 32.2 g/dL (ref 30.0–36.0)
MCV: 94.1 fL (ref 80.0–100.0)
Monocytes Absolute: 0.3 10*3/uL (ref 0.1–1.0)
Monocytes Relative: 5 %
Neutro Abs: 2.6 10*3/uL (ref 1.7–7.7)
Neutrophils Relative %: 51 %
Platelets: 232 10*3/uL (ref 150–400)
RBC: 4.22 MIL/uL (ref 3.87–5.11)
RDW: 13.8 % (ref 11.5–15.5)
WBC: 5.2 10*3/uL (ref 4.0–10.5)
nRBC: 0 % (ref 0.0–0.2)

## 2021-10-19 LAB — AMMONIA: Ammonia: 41 umol/L — ABNORMAL HIGH (ref 9–35)

## 2021-10-19 NOTE — Progress Notes (Signed)
   10/19/21 0515  Sleep  Number of Hours 8.25

## 2021-10-19 NOTE — Group Note (Signed)
[facility] LCSW Group Therapy Note  Date/Time:  Type of Therapy and Topic:  Group Therapy:  Who Am I?  Self Esteem, Self-Actualization and Understanding Self.  Participation Level:  Active  Description of Group:    In this group patients will be asked to explore values, beliefs, truths, and morals as they relate to personal self.  Patients will be guided to discuss their thoughts, feelings, and behaviors related to what they identify as important to their true self. Patients will process together how values, beliefs and truths are connected to specific choices patients make every day. Each patient will be challenged to identify changes that they are motivated to make in order to improve self-esteem and self-actualization. This group will be process-oriented, with patients participating in exploration of their own experiences as well as giving and receiving support and challenge from other group members.  Therapeutic Goals: Patient will identify false beliefs that currently interfere with their self-esteem.  Patient will identify feelings, thought process, and behaviors related to self and will become aware of the uniqueness of themselves and of others.  Patient will be able to identify and verbalize values, morals, and beliefs as they relate to self. Patient will begin to learn how to build self-esteem/self-awareness by expressing what is important and unique to them personally.  Summary of Patient Progress: Patient participated in group.  She was off topic at times and had fair insight.  Patient reports that she has values around family and work.  Patient engaged in discussion around values and assisted other peers in identifying values within themselves.              Therapeutic Modalities:   Cognitive Behavioral Therapy Solution Focused Therapy Motivational Interviewing Brief Therapy

## 2021-10-19 NOTE — Group Note (Signed)
Recreation Therapy Group Note   Group Topic:Personal Development  Group Date: 10/19/2021 Start Time: 1000 End Time: 1030 Facilitators: Caroll Rancher, LRT,CTRS Location: [address] Dayroom   Goal Area(s) Addresses:  Patient will successfully define what peace means to them. Patient will successfully identify how peace can be beneficial post discharge.    Group Description: Patients were given a worksheet were they what peace means to them.  Patients would then identify positive changes they have made, positive things they look for in relationships, goals they are working towards, nonmaterial things they are grateful for and what they need to set limits on.     Affect/Mood: Flat   Participation Level: Engaged   Participation Quality: Independent   Behavior: Appropriate   Speech/Thought Process: Disorganized   Insight: Lacking   Judgement: Lacking    Modes of Intervention: Worksheet   Patient Response to Interventions:  Engaged   Education Outcome:  Acknowledges education and In group clarification offered    Clinical Observations/Individualized Feedback: Pt was confused and for the most part wasn't on topic in answering the questions.  Pt was focused on discharging for every question asked on the sheet.  Pt expressed she has been here too long and needs to get out.  Pt also explained she wants to get a new job and can do that through a flash drive.      Plan: Continue to engage patient in RT group sessions 2-3x/week.   Caroll Rancher, LRT,CTRS  10/19/2021 1:03 PM

## 2021-10-19 NOTE — [facility] Group Notes (Signed)
  Spirituality group facilitated by Kathleen Argue, [facility].   Group Description: Group focused on topic of hope. Patients participated in facilitated discussion around topic, connecting with one another around experiences and definitions for hope. Group members engaged with visual explorer photos, reflecting on what hope looks like for them today. Group engaged in discussion around how their definitions of hope are present today in hospital.   Modalities: Psycho-social ed, Adlerian, Narrative, MI   Patient Progress: Natalie Brown attended group and was in good spirits.  Her comments at times were tangential, but other times, her comments were on topic and appropriate.  She appeared engaged in the conversation for the majority of the time.  [address], [facility] Pager, [phone]

## 2021-10-19 NOTE — BH IP Treatment Plan (Signed)
Interdisciplinary Treatment and Diagnostic Plan Update  10/19/2021 Time of Session: 10:05am Natalie Brown MRN: [id number]  Principal Diagnosis: Bipolar affective disorder, current episode manic with psychotic symptoms (HCC)  Secondary Diagnoses: Principal Problem:   Bipolar affective disorder, current episode manic with psychotic symptoms (HCC) Active Problems:   Increased ammonia level   Valproic acid causing adverse effect in therapeutic use   Altered mental status   Current Medications:  Current Facility-Administered Medications  Medication Dose Route Frequency Provider Last Rate Last Admin   alum & mag hydroxide-simeth (MAALOX/MYLANTA) 200-200-20 MG/5ML suspension 30 mL  30 mL Oral Q4H PRN Massengill, Harrold Donath, MD       benztropine (COGENTIN) tablet 0.5 mg  0.5 mg Oral BID Karsten Ro, MD   0.5 mg at 10/19/21 0725   feeding supplement (KATE FARMS STANDARD 1.4) liquid 325 mL  325 mL Oral BID BM Massengill, Nathan, MD   325 mL at 10/19/21 1131   fluticasone (FLONASE) 50 MCG/ACT nasal spray 1 spray  1 spray Each Nare Daily Massengill, Harrold Donath, MD   1 spray at 10/19/21 1660   haloperidol (HALDOL) tablet 10 mg  10 mg Oral QHS Bartholomew Crews E, MD   10 mg at 10/18/21 2048   haloperidol (HALDOL) tablet 5 mg  5 mg Oral Daily Comer Locket, MD   5 mg at 10/19/21 6301   hydrocerin (EUCERIN) cream   Topical BID Phineas Inches, MD   Given at 10/19/21 1132   hydrOXYzine (ATARAX) tablet 25 mg  25 mg Oral TID PRN Phineas Inches, MD   25 mg at 09/10/21 1204   LORazepam (ATIVAN) tablet 1 mg  1 mg Oral Q6H PRN Bartholomew Crews E, MD   1 mg at 10/07/21 1546   magnesium hydroxide (MILK OF MAGNESIA) suspension 30 mL  30 mL Oral Daily PRN Massengill, Harrold Donath, MD       nicotine polacrilex (NICORETTE) gum 2 mg  2 mg Oral PRN Abbott Pao, Nadir, MD   2 mg at 10/19/21 0726   OLANZapine zydis (ZYPREXA) disintegrating tablet 5 mg  5 mg Oral Q8H PRN Bartholomew Crews E, MD   5 mg at 10/14/21 1416   And    ziprasidone (GEODON) injection 20 mg  20 mg Intramuscular PRN Comer Locket, MD       PTA Medications: Medications Prior to Admission  Medication Sig Dispense Refill Last Dose   CLARITIN 10 MG tablet Take 10 mg by mouth daily.      fluticasone (FLONASE) 50 MCG/ACT nasal spray Place 1 spray into both nostrils daily.      pseudoephedrine-guaifenesin (MUCINEX D) 60-600 MG 12 hr tablet Take 1 tablet by mouth every 12 (twelve) hours. 30 tablet 0     Patient Stressors: Health problems   Medication change or noncompliance    Patient Strengths: Capable of independent living  Printmaker for treatment/growth  Supportive family/friends   Treatment Modalities: Medication Management, Group therapy, Case management,  1 to 1 session with clinician, Psychoeducation, Recreational therapy.   Physician Treatment Plan for Primary Diagnosis: Bipolar affective disorder, current episode manic with psychotic symptoms (HCC) Long Term Goal(s): Improvement in symptoms so as ready for discharge   Short Term Goals: Ability to identify and develop effective coping behaviors will improve Ability to maintain clinical measurements within normal limits will improve Compliance with prescribed medications will improve Ability to identify changes in lifestyle to reduce recurrence of condition will improve Ability to verbalize feelings will improve Ability to disclose and  discuss suicidal ideas Ability to demonstrate self-control will improve  Medication Management: Evaluate patient's response, side effects, and tolerance of medication regimen.  Therapeutic Interventions: 1 to 1 sessions, Unit Group sessions and Medication administration.  Evaluation of Outcomes: Progressing  Physician Treatment Plan for Secondary Diagnosis: Principal Problem:   Bipolar affective disorder, current episode manic with psychotic symptoms (HCC) Active Problems:   Increased ammonia level   Valproic acid  causing adverse effect in therapeutic use   Altered mental status  Long Term Goal(s): Improvement in symptoms so as ready for discharge   Short Term Goals: Ability to identify and develop effective coping behaviors will improve Ability to maintain clinical measurements within normal limits will improve Compliance with prescribed medications will improve Ability to identify changes in lifestyle to reduce recurrence of condition will improve Ability to verbalize feelings will improve Ability to disclose and discuss suicidal ideas Ability to demonstrate self-control will improve     Medication Management: Evaluate patient's response, side effects, and tolerance of medication regimen.  Therapeutic Interventions: 1 to 1 sessions, Unit Group sessions and Medication administration.  Evaluation of Outcomes: Progressing   RN Treatment Plan for Primary Diagnosis: Bipolar affective disorder, current episode manic with psychotic symptoms (HCC) Long Term Goal(s): Knowledge of disease and therapeutic regimen to maintain health will improve  Short Term Goals: Ability to remain free from injury will improve, Ability to participate in decision making will improve, Ability to verbalize feelings will improve, Ability to disclose and discuss suicidal ideas, and Ability to identify and develop effective coping behaviors will improve  Medication Management: RN will administer medications as ordered by provider, will assess and evaluate patient's response and provide education to patient for prescribed medication. RN will report any adverse and/or side effects to prescribing provider.  Therapeutic Interventions: 1 on 1 counseling sessions, Psychoeducation, Medication administration, Evaluate responses to treatment, Monitor vital signs and CBGs as ordered, Perform/monitor CIWA, COWS, AIMS and Fall Risk screenings as ordered, Perform wound care treatments as ordered.  Evaluation of Outcomes:  Progressing   LCSW Treatment Plan for Primary Diagnosis: Bipolar affective disorder, current episode manic with psychotic symptoms (HCC) Long Term Goal(s): Safe transition to appropriate next level of care at discharge, Engage patient in therapeutic group addressing interpersonal concerns.  Short Term Goals: Engage patient in aftercare planning with referrals and resources, Increase social support, Increase emotional regulation, Facilitate acceptance of mental health diagnosis and concerns, Identify triggers associated with mental health/substance abuse issues, and Increase skills for wellness and recovery  Therapeutic Interventions: Assess for all discharge needs, 1 to 1 time with Social worker, Explore available resources and support systems, Assess for adequacy in community support network, Educate family and significant other(s) on suicide prevention, Complete Psychosocial Assessment, Interpersonal group therapy.  Evaluation of Outcomes: Progressing   Progress in Treatment: Attending groups: Yes. Participating in groups: Yes. Taking medication as prescribed: Yes. Toleration medication: Yes. Family/Significant other contact made: Yes, individual(s) contacted:  SPE completed with Ms Dan Humphreys, mother.  Patient understands diagnosis: Yes. Discussing patient identified problems/goals with staff: Yes. Medical problems stabilized or resolved: Yes. Denies suicidal/homicidal ideation: Yes. Issues/concerns per patient self-inventory: Yes. Other: none   New problem(s) identified: Yes, Describe:  Mother has filed for guardianship over patient, has court for 12/24/2021. Mother was encouraged to file for emergent guardianship. CSW has filed APS report to pursue DSS guardianship.    New Short Term/Long Term Goal(s): Patient to work towards  elimination of symptoms of psychosis, medication management for mood stabilization;development of  comprehensive mental wellness plan.   Patient Goals:  No  additional goals identified at this time. Patient to continue to work towards original goals identified in initial treatment team meeting. CSW will remain available to patient should they voice additional treatment goals.    Discharge Plan or Barriers: Patient is homeless, lacks capacity to be self sufficient.    Reason for Continuation of Hospitalization: Mania   Estimated Length of Stay: TBD  Last 3 [place] Suicide Severity Risk Score: Flowsheet Row [facility] (Current) [facility] from 09/05/2021 in [facility] ED from 05/26/2020 in [facility]  C-SSRS RISK CATEGORY High Risk High Risk No Risk       Last PHQ 2/9 Scores:    05/26/2020    2:14 PM 09/03/2017    4:34 PM 08/07/2016    2:12 PM  Depression screen PHQ 2/9  Decreased Interest 3 3 3   Down, Depressed, Hopeless 3 1 2   PHQ - 2 Score 6 4 5   Altered sleeping 0 3 3  Tired, decreased energy 0 3 0  Change in appetite 3 3 0  Feeling bad or failure about yourself  0 3 1  Trouble concentrating 3 0 0  Moving slowly or fidgety/restless 3 0 1  Suicidal thoughts 0 0 0  PHQ-9 Score 15 16 10     Scribe for Treatment Team: , 10/19/2021 11:33 AM

## 2021-10-19 NOTE — Progress Notes (Signed)
[facility] MD Progress Note  10/19/2021 3:09 PM Natalie Brown  MRN:  [id number] Subjective:   Natalie Brown is a 56 yr old female who presented on 5/17 to [facility] with complaints of anxiety and feeling overwhelmed and SI without a plan, she was admitted to [facility] on 5/19.  PPHx is significant for possible Bipolar Disorder.   Case was discussed in the multidisciplinary team. MAR was reviewed and patient was compliant with medications.  She did not receive any PRN medications yesterday.   Psychiatric Team made the following recommendations yesterday: -Continue Haldol 5 mg AM and 10 mg QHS -Continue Cogentin to 0.5 mg BID -Invega Sustenna 234 mg IM given on 09-30-21 129m IM given 10/12/20   On interview today patient reports she slept good last night.  She reports her appetite is doing good.  She reports no SI, HI, or AVH.  She reports no Paranoia, Ideas of Reference, or other First Rank symptoms.  She reports no issues with her medications.  Thanked her for getting her blood work this morning.  Discussed with her that her ammonia level continues to down trend towards normal and that her creatinine has also downtrended towards normal.  She continues to ask why she is still admitted while so many other patients have come and gone.  Discussed with her that we are still working on getting Medicare/Medicaid and disability so that she will be able to go to a group home.  She reports no other concerns at present.     Principal Problem: Bipolar affective disorder, current episode manic with psychotic symptoms ([place] Diagnosis: Principal Problem:   Bipolar affective disorder, current episode manic with psychotic symptoms ([place] Active Problems:   Increased ammonia level   Valproic acid causing adverse effect in therapeutic use   Altered mental status  Total Time spent with patient:  I personally spent 30 minutes on the unit in direct patient care. The direct patient care time included face-to-face time  with the patient, reviewing the patient's chart, communicating with other professionals, and coordinating care. Greater than 50% of this time was spent in counseling or coordinating care with the patient regarding goals of hospitalization, psycho-education, and discharge planning needs.   Past Psychiatric History: Probable bipolar disorder  Past Medical History:  Past Medical History:  Diagnosis Date   Bipolar 1 disorder with moderate mania ([place] 12/[place]/2013   Hypertension    History reviewed. No pertinent surgical history. Family History: History reviewed. No pertinent family history. Family Psychiatric  History: Patient reports mental illness runs in her father's side of the family Social History:  Social History   Substance and Sexual Activity  Alcohol Use No     Social History   Substance and Sexual Activity  Drug Use No    Social History   Socioeconomic History   Marital status: Married    Spouse name: Not on file   Number of children: Not on file   Years of education: Not on file   Highest education level: Not on file  Occupational History   Not on file  Tobacco Use   Smoking status: Every Day   Smokeless tobacco: Current  Substance and Sexual Activity   Alcohol use: No   Drug use: No   Sexual activity: Not on file  Other Topics Concern   Not on file  Social History Narrative   Not on file   Social Determinants of Health   Financial Resource Strain: Not on file  Food Insecurity: Not on  file  Transportation Needs: Not on file  Physical Activity: Not on file  Stress: Not on file  Social Connections: Not on file   Additional Social History:                         Sleep: Good  Appetite:  Good  Current Medications: Current Facility-Administered Medications  Medication Dose Route Frequency Provider Last Rate Last Admin   alum & mag hydroxide-simeth (MAALOX/MYLANTA) 200-200-20 MG/5ML suspension 30 mL  30 mL Oral Q4H PRN Massengill, Nathan, MD        benztropine (COGENTIN) tablet 0.5 mg  0.5 mg Oral BID Rosita Kea, Vandana, MD   0.5 mg at 10/19/21 0725   feeding supplement (KATE FARMS STANDARD 1.4) liquid 325 mL  325 mL Oral BID BM Massengill, Nathan, MD   325 mL at 10/19/21 1303   fluticasone (FLONASE) 50 MCG/ACT nasal spray 1 spray  1 spray Each Nare Daily Massengill, [name], MD   1 spray at 10/19/21 0727   haloperidol (HALDOL) tablet 10 mg  10 mg Oral QHS Nelda Marseille, Amy E, MD   10 mg at 10/18/21 2048   haloperidol (HALDOL) tablet 5 mg  5 mg Oral Daily Viann Fish E, MD   5 mg at 10/19/21 4010   hydrocerin (EUCERIN) cream   Topical BID Massengill, [name], MD   Given at 10/19/21 1132   hydrOXYzine (ATARAX) tablet 25 mg  25 mg Oral TID PRN Janine Limbo, MD   25 mg at 09/10/21 1204   LORazepam (ATIVAN) tablet 1 mg  1 mg Oral Q6H PRN Viann Fish E, MD   1 mg at 10/07/21 1546   magnesium hydroxide (MILK OF MAGNESIA) suspension 30 mL  30 mL Oral Daily PRN Massengill, [name], MD       nicotine polacrilex (NICORETTE) gum 2 mg  2 mg Oral PRN Winfred Leeds, Nadir, MD   2 mg at 10/19/21 0726   OLANZapine zydis (ZYPREXA) disintegrating tablet 5 mg  5 mg Oral Q8H PRN Harlow Asa, MD   5 mg at 10/14/21 1416   And   ziprasidone (GEODON) injection 20 mg  20 mg Intramuscular PRN Harlow Asa, MD        Lab Results:  Results for orders placed or performed during the hospital encounter of 09/06/21 (from the past 48 hour(s))  Ammonia     Status: Abnormal   Collection Time: 10/19/21  6:29 AM  Result Value Ref Range   Ammonia 41 (H) 9 - 35 umol/L    Comment: Performed at [facility], [place] [address], [place],  [address]  CBC with Differential     Status: None   Collection Time: 10/19/21  6:29 AM  Result Value Ref Range   WBC 5.2 4.0 - 10.5 K/uL   RBC 4.22 3.87 - 5.11 MIL/uL   Hemoglobin 12.8 12.0 - 15.0 g/dL   HCT 39.7 36.0 - 46.0 %   MCV 94.1 80.0 - 100.0 fL   MCH 30.3 26.0 - 34.0 pg   MCHC 32.2 30.0 - 36.0  g/dL   RDW 13.8 11.5 - 15.5 %   Platelets 232 150 - 400 K/uL   nRBC 0.0 0.0 - 0.2 %   Neutrophils Relative % 51 %   Neutro Abs 2.6 1.7 - 7.7 K/uL   Lymphocytes Relative 36 %   Lymphs Abs 1.8 0.7 - 4.0 K/uL   Monocytes Relative 5 %   Monocytes Absolute 0.3 0.1 -  1.0 K/uL   Eosinophils Relative 7 %   Eosinophils Absolute 0.4 0.0 - 0.5 K/uL   Basophils Relative 1 %   Basophils Absolute 0.1 0.0 - 0.1 K/uL   Immature Granulocytes 0 %   Abs Immature Granulocytes 0.01 0.00 - 0.07 K/uL    Comment: Performed at [facility], [place] [address], [place], [place] [address]  Comprehensive metabolic panel     Status: Abnormal   Collection Time: 10/19/21  6:29 AM  Result Value Ref Range   Sodium 139 135 - 145 mmol/L   Potassium 4.3 3.5 - 5.1 mmol/L   Chloride 107 98 - 111 mmol/L   CO2 23 22 - 32 mmol/L   Glucose, Bld 103 (H) 70 - 99 mg/dL    Comment: Glucose reference range applies only to samples taken after fasting for at least 8 hours.   BUN 19 6 - 20 mg/dL   Creatinine, Ser 1.04 (H) 0.44 - 1.00 mg/dL   Calcium 8.6 (L) 8.9 - 10.3 mg/dL   Total Protein 7.1 6.5 - 8.1 g/dL   Albumin 3.5 3.5 - 5.0 g/dL   AST 17 15 - 41 U/L   ALT 14 0 - 44 U/L   Alkaline Phosphatase 50 38 - 126 U/L   Total Bilirubin 0.4 0.3 - 1.2 mg/dL   GFR, Estimated >60 >60 mL/min    Comment: (NOTE) Calculated using the CKD-EPI Creatinine Equation (2021)    Anion gap 9 5 - 15    Comment: Performed at [facility], [place] [address], [place], [place] [address]    Blood Alcohol level:  Lab Results  Component Value Date   ETH <10 76/73/4193    Metabolic Disorder Labs: Lab Results  Component Value Date   HGBA1C 5.3 09/08/2021   MPG 105.41 09/08/2021   No results found for: "PROLACTIN" Lab Results  Component Value Date   CHOL 173 09/08/2021   TRIG 110 09/08/2021   HDL 50 09/08/2021   CHOLHDL 3.5 09/08/2021   VLDL 22 09/08/2021   LDLCALC 101 (H) 09/08/2021   LDLCALC 102 (H)  06/21/2020    Physical Findings: AIMS: Facial and Oral Movements Muscles of Facial Expression: None, normal Lips and Perioral Area: None, normal Jaw: None, normal Tongue: None, normal,Extremity Movements Upper (arms, wrists, hands, fingers): None, normal Lower (legs, knees, ankles, toes): None, normal, Trunk Movements Neck, shoulders, hips: None, normal, Overall Severity Severity of abnormal movements (highest score from questions above): None, normal Incapacitation due to abnormal movements: None, normal Patient's awareness of abnormal movements (rate only patient's report): No Awareness, Dental Status Current problems with teeth and/or dentures?: No Does patient usually wear dentures?: No  CIWA:    COWS:     Musculoskeletal: Strength & Muscle Tone: within normal limits Gait & Station: normal Patient leans: N/A  Psychiatric Specialty Exam:  Presentation  General Appearance: Appropriate for Environment; Casual  Eye Contact:Good  Speech:Clear and Coherent; Normal Rate  Speech Volume:Normal  Handedness:Right   Mood and Affect  Mood:Dysphoric  Affect:Congruent; Appropriate   Thought Process  Thought Processes:Disorganized  Descriptions of Associations:Tangential  Orientation:Partial  Thought Content:Illogical; Tangential; Scattered (Perseverates on discharge) No SI, HI, or AVH. No Paranoia, Ideas of Reference, or First Rank symptoms.  History of Schizophrenia/Schizoaffective disorder:Yes  Duration of Psychotic Symptoms:Less than six months  Hallucinations:Hallucinations: None  Ideas of Reference:None  Suicidal Thoughts:Suicidal Thoughts: No  Homicidal Thoughts:Homicidal Thoughts: No   Sensorium  Memory:Immediate Fair; Recent Fair  Judgment:Impaired  Insight:Lacking  Executive Functions  Concentration:Fair  Attention Span:Fair  [place]   Psychomotor Activity  Psychomotor  Activity:Psychomotor Activity: Normal   Assets  Assets:Communication Skills; Resilience   Sleep  Sleep:Sleep: Good Number of Hours of Sleep: 8.25    Physical Exam: Physical Exam Vitals and nursing note reviewed.  Constitutional:      General: [name] not in acute distress.    Appearance: Normal appearance. [name] normal weight. [name] not ill-appearing or toxic-appearing.  HENT:     Head: Normocephalic and atraumatic.  Pulmonary:     Effort: Pulmonary effort is normal.  Musculoskeletal:        General: Normal range of motion.  Neurological:     General: No focal deficit present.     Mental Status: [name] alert.    Review of Systems  Respiratory:  Negative for cough and shortness of breath.   Cardiovascular:  Negative for chest pain.  Gastrointestinal:  Negative for abdominal pain, constipation, diarrhea, nausea and vomiting.  Neurological:  Negative for dizziness, weakness and headaches.  Psychiatric/Behavioral:  Negative for depression, hallucinations and suicidal ideas. The patient is not nervous/anxious.    Blood pressure 111/76, pulse 82, temperature 98.6 F (37 C), temperature source Oral, resp. rate 18, height 5' 6"  (1.676 m), weight 72.6 kg, SpO2 100 %. Body mass index is 25.83 kg/m.   Treatment Plan Summary: Daily contact with patient to assess and evaluate symptoms and progress in treatment and Medication management  Natalie Brown is a 56 yr old female who presented on 5/17 to [facility] with complaints of anxiety and feeling overwhelmed and SI without a plan, she was admitted to [facility] on 5/19.      Natalie Brown was able to get her blood work this morning which showed her Creatinine and Ammonia continue to trend towards normal.  She still perseverates on being here for so long but with repeated describing of the situation she will report understanding before again asking why she is still here.  We will not make any medication changes at this time.  We will continue to  monitor.      Bipolar I MRE manic with psychotic features (r/o schizoaffective d/o - bipolar type)             -- Zyprexa was discontinued secondary to lack of efficacy             -Continue Haldol 5 mg AM and 10 mg QHS for psychosis and mood stabilization - hope to taper down or off as tolerated now that she is loaded with Kirt Boys.             -Continue Cogentin 0.5 mg twice daily (decreased recently in event anticholinergic med has contributed to recent confusion)             Lorayne Bender Sustenna 234 mg IM given on 09-30-21 154m IM given 10/12/20 Lipid Panel: WNL except for LDL 101; HbgA1c: 5.3; QTc:4134m- repeating EKG for monitoring of QTC-ordered and pending when she will cooperate             -Depakote tapered and stopped given recent hyperammonemia - Ammonia trending down 48 and refused repeat lab today; CBC, LFT on 6/20 within normal limits              -- Ativan discontinued to limit sedative agents.             -- Zyprexa/Geodon/ativan agitation protocol            -  Additional psychosis labs (HIV- , RPR-, ANA-, heavy metal WNL, ESR wnl, ceruloplasmin wnl and CT head shows Marked for age brain atrophy.  No acute or reversible finding.)       Hyperammonemia: -Repeat ammonia level was trending down with last draw -Currently not compliant with blood draws for a repeat level. -Depakote was stopped     Mildly elevated creatinine  -Creatinine downtrending to 1.20 from 1.35 -  po hydration encouraged.  -We will keep on trending as she will cooperate for labs     Season allergies -Continue Claritin 27m daily PRN     Urinary frequency -repeat UA with rare bacteria, but other wise, WNL   Labs On Admission- CMP: WNL except Cr: 1.1,  GFR: 60,  CBC: WNL,  UDS: Neg,  Troponin: 3,  EtOH: Neg,  beta hCG: 6.4,  Resp Panel: Neg 5/29- Urine Preg: Neg,  A1c: 5.3,  TSH: 1.719,  Lipid Panel: WNL except LDL: 101 5/24- BMP: WNL except Cr: 1.01,  Ca: 8.4,  hCG: 5,  Urine Preg: Neg 5/28-  Ammonia: 32,  CBC: WNL,  CMP: WNL except  Cr: 1.08,  Ca: 8.6,  Depakote lvl: 20 6/3- Depakote lvl: 64, Hep Function: WNL,  CBC: WNL except Hem: 11.9 6/7- Depakote lvl: 75 6/18- UA: Neg,  CBC: WNL,  Depakote lvl: 136,  Ammonia: 99,  BMP: WNL except BUN: 22,  Cr: 1.19,  Ca: 8.6,  GFR: 54,  Hep Function: WNL except Albu: 3.3,  Indirect Bili: 0.2 6/20- ANA: Neg,  Ammonia: 48,  Ceruloplasmin: 24.2,  CMP: WNL except BUN: 23,  Cr: 1.35,  Ca: 8.3,  Albu: 3.4  HIV:Neg,  RPR: Neg,  Sed Rate: 6,  Depakote lvl: 25,  Heavy Metal: Neg 6/23- Ammonia: 46,  BMP: WNL except Cr: 1.2,  Ca: 8.3,  GFR: 54 6/24- UA: WNL except Bacteria: rare 6/30- CMP:  WNL except Cr: 1.04,  Ca: 8.6,  CBC: WNL,  Ammonia: [place] MD 10/19/2021, 3:09 PM

## 2021-10-19 NOTE — Progress Notes (Signed)
Adult Psychoeducational Group Note  Date:  10/19/2021 Time:  9:29 PM  Group Topic/Focus:  Wrap-Up Group:   The focus of this group is to help patients review their daily goal of treatment and discuss progress on daily workbooks.  Participation Level:  Did Not Attend  Participation Quality:  Did Not Attend  Affect:  Did Not Attend  Cognitive:  Did Not Attend  Insight: Did Not Attend  Engagement in Group:  Did Not Attend  Modes of Intervention:  Did Not Attend  Additional Comments:   Pt was encouraged to attend group but refused.  Vevelyn Pat 10/19/2021, 9:29 PM

## 2021-10-19 NOTE — Progress Notes (Signed)
[facility] LCSW Progress Note   10/19/2021    2:08 PM  Natalie Brown   [id number]   Type of Contact and Topic:  Care Coordination   CSW contacted Onnie Boer- [phone] ext [phone] in order to coordinate time for him to come assist the patient to apply for disability. [name] will come Thursday 10/25/2021 at 1000 am. Situation ongoing, CSW will continue to monitor and update note as more information becomes available.     Signed:  [name], MSW, LCSWA, LCAS 10/19/2021 2:08 PM

## 2021-10-19 NOTE — Progress Notes (Signed)
   10/19/21 2100  Psych Admission Type (Psych Patients Only)  Admission Status Involuntary  Psychosocial Assessment  Patient Complaints Confusion  Eye Contact Fair  Facial Expression Anxious  Affect Anxious;Preoccupied  Speech Slurred  Interaction Assertive  Motor Activity Slow  Appearance/Hygiene Layered clothes  Behavior Characteristics Cooperative  Mood Anxious;Pleasant  Aggressive Behavior  Effect No apparent injury  Thought Process  Coherency Disorganized  Content Preoccupation  Delusions None reported or observed  Perception Derealization  Hallucination None reported or observed  Judgment Impaired  Confusion None  Danger to Self  Current suicidal ideation? Denies  Danger to Others  Danger to Others None reported or observed

## 2021-10-20 DIAGNOSIS — F3112 Bipolar disorder, current episode manic without psychotic features, moderate: Secondary | ICD-10-CM | POA: Diagnosis not present

## 2021-10-20 MED ORDER — ACETAMINOPHEN 325 MG PO TABS
650.0000 mg | ORAL_TABLET | Freq: Four times a day (QID) | ORAL | Status: DC | PRN
Start: 2021-10-20 — End: 2021-12-24
  Administered 2021-10-23 – 2021-11-14 (×4): 650 mg via ORAL
  Filled 2021-10-20 (×4): qty 2

## 2021-10-20 MED ORDER — ACETAMINOPHEN 325 MG PO TABS
650.0000 mg | ORAL_TABLET | Freq: Once | ORAL | Status: AC
  Administered 2021-10-20: 650 mg via ORAL
  Filled 2021-10-20 (×2): qty 2

## 2021-10-20 NOTE — Group Note (Signed)
LCSW Group Therapy Note   Group Date: 10/20/2021 Start Time: 1115 End Time: 1200   Did not attend.  Lynnell Chad, LCSWA 10/20/2021  11:12 AM

## 2021-10-20 NOTE — Progress Notes (Signed)
Adult Psychoeducational Group Note  Date:  10/20/2021 Time:  9:36 PM  Group Topic/Focus:  Wrap-Up Group:   The focus of this group is to help patients review their daily goal of treatment and discuss progress on daily workbooks.  Participation Level:  Active  Participation Quality:  Appropriate  Affect:  Appropriate  Cognitive:  Appropriate  Insight: Appropriate  Engagement in Group:  Developing/Improving  Modes of Intervention:  Discussion  Additional Comments:  Pt stated her goal for today was to focus on her treatment plan. Pt stated she accomplished her goal today. Pt stated she  talked with her doctor but did not speak with her social worker about her care today. Pt rated her overall day a 10. Pt stated she made no calls today. Pt stated she felt better about herself tonight. Pt stated attend all other meals today. Pt stated she took all medications provided today. Pt stated her appetite was fair today. Pt rated her sleep last night was pretty good. Pt stated the goal tonight was to get some rest. Pt stated she had no physical pain tonight. Pt deny visual hallucinations and auditory issues tonight. Pt denies thoughts of harming herself or others. Pt stated she would alert staff if anything changed.  Natalie Brown 10/20/2021, 9:36 PM

## 2021-10-20 NOTE — Progress Notes (Signed)
   10/20/21 0530  Sleep  Number of Hours 9.25

## 2021-10-20 NOTE — Progress Notes (Addendum)
[facility] MD Progress Note  10/20/2021 10:28 AM Natalie Brown  MRN:  [id number] Subjective:   Natalie Brown is a 56 yr old female who presented on 5/17 to [facility] with complaints of anxiety and feeling overwhelmed and SI without a plan, she was admitted to [facility] on 5/19.  PPHx is significant for possible Bipolar Disorder.   Case was discussed in the multidisciplinary team. MAR was reviewed and patient was compliant with medications.  She did not receive any PRN medications yesterday.   Psychiatric Team made the following recommendations yesterday: -Continue Haldol 5 mg AM and 10 mg QHS -Continue Cogentin to 0.5 mg BID -Invega Sustenna 234 mg IM given on 09-30-21 165m IM given 10/12/20    On interview today patient reports she slept good last night.  She reports her appetite is doing good.  She reports no SI, HI, or AVH.  She reports no Paranoia, Ideas of Reference, or other First Rank symptoms.  She reports no issues with her medications.  She reports that she needs to be discharged.  Discussed with her that [facility].  She states that her sister has filled out paperwork and her sister birthday is on the 3rd so needs to be there for that.  Discussed with her that we are continuing to work on getting her Disability and Medicare/Medicaid.  She continues to state that she is able to take care of herself and wants to be discharged.  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  Encouraged her to ask for a Tylenol if needed.  She reports no other concerns at present.    Principal Problem: Bipolar affective disorder, current episode manic with psychotic symptoms ([facility] Diagnosis: Principal Problem:   Bipolar affective disorder, current episode manic with psychotic symptoms ([place] Active Problems:   Increased ammonia level   Valproic acid causing  adverse effect in therapeutic use   Altered mental status  Total Time spent with patient:  I personally spent 30 minutes on the unit in direct patient care. The direct patient care time included face-to-face time with the patient, reviewing the patient's chart, communicating with other professionals, and coordinating care. Greater than 50% of this time was spent in counseling or coordinating care with the patient regarding goals of hospitalization, psycho-education, and discharge planning needs.   Past Psychiatric History: Probable bipolar disorder  Past Medical History:  Past Medical History:  Diagnosis Date   Bipolar 1 disorder with moderate mania ([facility] [place]   Hypertension    History reviewed. No pertinent surgical history. Family History: History reviewed. No pertinent family history. Family Psychiatric  History: Patient reports mental illness runs in her father's side of the family Social History:  Social History   Substance and Sexual Activity  Alcohol Use No     Social History   Substance and Sexual Activity  Drug Use No    Social History   Socioeconomic History   Marital status: Married    Spouse name: Not on file   Number of children: Not on file   Years of education: Not on file   Highest education level: Not on file  Occupational History   Not on file  Tobacco Use   Smoking status: Every Day   Smokeless tobacco: Current  Substance and Sexual Activity   Alcohol use: No  Drug use: No   Sexual activity: Not on file  Other Topics Concern   Not on file  Social History Narrative   Not on file   Social Determinants of Health   Financial Resource Strain: Not on file  Food Insecurity: Not on file  Transportation Needs: Not on file  Physical Activity: Not on file  Stress: Not on file  Social Connections: Not on file   Additional Social History:                         Sleep: Good  Appetite:  Good  Current Medications: Current  Facility-Administered Medications  Medication Dose Route Frequency Provider Last Rate Last Admin   acetaminophen (TYLENOL) tablet 650 mg  650 mg Oral Q6H PRN Briant Cedar, MD       alum & mag hydroxide-simeth (MAALOX/MYLANTA) 200-200-20 MG/5ML suspension 30 mL  30 mL Oral Q4H PRN Massengill, Nathan, MD       benztropine (COGENTIN) tablet 0.5 mg  0.5 mg Oral BID Rosita Kea, Vandana, MD   0.5 mg at 10/20/21 0731   feeding supplement (KATE FARMS STANDARD 1.4) liquid 325 mL  325 mL Oral BID BM Massengill, Nathan, MD   325 mL at 10/19/21 1303   fluticasone (FLONASE) 50 MCG/ACT nasal spray 1 spray  1 spray Each Nare Daily Massengill, [name], MD   1 spray at 10/20/21 0731   haloperidol (HALDOL) tablet 10 mg  10 mg Oral QHS Nelda Marseille, Amy E, MD   10 mg at 10/19/21 2041   haloperidol (HALDOL) tablet 5 mg  5 mg Oral Daily Viann Fish E, MD   5 mg at 10/20/21 0732   hydrocerin (EUCERIN) cream   Topical BID Janine Limbo, MD   Given at 10/20/21 0731   hydrOXYzine (ATARAX) tablet 25 mg  25 mg Oral TID PRN Janine Limbo, MD   25 mg at 09/10/21 1204   LORazepam (ATIVAN) tablet 1 mg  1 mg Oral Q6H PRN Viann Fish E, MD   1 mg at 10/07/21 1546   magnesium hydroxide (MILK OF MAGNESIA) suspension 30 mL  30 mL Oral Daily PRN Massengill, [name], MD       nicotine polacrilex (NICORETTE) gum 2 mg  2 mg Oral PRN Winfred Leeds, Nadir, MD   2 mg at 10/19/21 0726   OLANZapine zydis (ZYPREXA) disintegrating tablet 5 mg  5 mg Oral Q8H PRN Harlow Asa, MD   5 mg at 10/14/21 1416   And   ziprasidone (GEODON) injection 20 mg  20 mg Intramuscular PRN Harlow Asa, MD        Lab Results:  Results for orders placed or performed during the hospital encounter of 09/06/21 (from the past 48 hour(s))  Ammonia     Status: Abnormal   Collection Time: 10/19/21  6:29 AM  Result Value Ref Range   Ammonia 41 (H) 9 - 35 umol/L    Comment: Performed at [facility], [place] [address] [place],  [place] [address]  CBC with Differential     Status: None   Collection Time: 10/19/21  6:29 AM  Result Value Ref Range   WBC 5.2 4.0 - 10.5 K/uL   RBC 4.22 3.87 - 5.11 MIL/uL   Hemoglobin 12.8 12.0 - 15.0 g/dL   HCT 39.7 36.0 - 46.0 %   MCV 94.1 80.0 - 100.0 fL   MCH 30.3 26.0 - 34.0 pg   MCHC 32.2 30.0 - 36.0 g/dL  RDW 13.8 11.5 - 15.5 %   Platelets 232 150 - 400 K/uL   nRBC 0.0 0.0 - 0.2 %   Neutrophils Relative % 51 %   Neutro Abs 2.6 1.7 - 7.7 K/uL   Lymphocytes Relative 36 %   Lymphs Abs 1.8 0.7 - 4.0 K/uL   Monocytes Relative 5 %   Monocytes Absolute 0.3 0.1 - 1.0 K/uL   Eosinophils Relative 7 %   Eosinophils Absolute 0.4 0.0 - 0.5 K/uL   Basophils Relative 1 %   Basophils Absolute 0.1 0.0 - 0.1 K/uL   Immature Granulocytes 0 %   Abs Immature Granulocytes 0.01 0.00 - 0.07 K/uL    Comment: Performed at [facility], [place] [address] [place], [place] [address]  Comprehensive metabolic panel     Status: Abnormal   Collection Time: 10/19/21  6:29 AM  Result Value Ref Range   Sodium 139 135 - 145 mmol/L   Potassium 4.3 3.5 - 5.1 mmol/L   Chloride 107 98 - 111 mmol/L   CO2 23 22 - 32 mmol/L   Glucose, Bld 103 (H) 70 - 99 mg/dL    Comment: Glucose reference range applies only to samples taken after fasting for at least 8 hours.   BUN 19 6 - 20 mg/dL   Creatinine, Ser 1.04 (H) 0.44 - 1.00 mg/dL   Calcium 8.6 (L) 8.9 - 10.3 mg/dL   Total Protein 7.1 6.5 - 8.1 g/dL   Albumin 3.5 3.5 - 5.0 g/dL   AST 17 15 - 41 U/L   ALT 14 0 - 44 U/L   Alkaline Phosphatase 50 38 - 126 U/L   Total Bilirubin 0.4 0.3 - 1.2 mg/dL   GFR, Estimated >60 >60 mL/min    Comment: (NOTE) Calculated using the CKD-EPI Creatinine Equation (2021)    Anion gap 9 5 - 15    Comment: Performed at [facility], [place] [address] [place], [place] [address]    Blood Alcohol level:  Lab Results  Component Value Date   ETH <10 35/57/3220    Metabolic Disorder Labs: Lab  Results  Component Value Date   HGBA1C 5.3 09/08/2021   MPG 105.41 09/08/2021   No results found for: "PROLACTIN" Lab Results  Component Value Date   CHOL 173 09/08/2021   TRIG 110 09/08/2021   HDL 50 09/08/2021   CHOLHDL 3.5 09/08/2021   VLDL 22 09/08/2021   LDLCALC 101 (H) 09/08/2021   LDLCALC 102 (H) 06/21/2020    Physical Findings:  AIMS: Facial and Oral Movements Muscles of Facial Expression: None, normal Lips and Perioral Area: None, normal Jaw: None, normal Tongue: Minimal,Extremity Movements Upper (arms, wrists, hands, fingers): None, normal Lower (legs, knees, ankles, toes): None, normal, Trunk Movements Neck, shoulders, hips: None, normal, Overall Severity Severity of abnormal movements (highest score from questions above): Minimal Incapacitation due to abnormal movements: None, normal Patient's awareness of abnormal movements (rate only patient's report): No Awareness, Dental Status Current problems with teeth and/or dentures?: No Does patient usually wear dentures?: No  No Cogwheeling or Rigidity Present.   Musculoskeletal: Strength & Muscle Tone: within normal limits Gait & Station: normal Patient leans: N/A  Psychiatric Specialty Exam:  Presentation  General Appearance: Appropriate for Environment; Casual  Eye Contact:Good  Speech:Clear and Coherent; Normal Rate  Speech Volume:Normal  Handedness:Right   Mood and Affect  Mood:Dysphoric  Affect:Congruent; Appropriate   Thought Process  Thought Processes:Disorganized  Descriptions of Associations:Tangential  Orientation:Partial  Thought Content:Illogical;  Tangential; Scattered (Perseverates on discharge) No SI, HI, or AVH. No Paranoia, Ideas of Reference, or First Rank symptoms.  History of Schizophrenia/Schizoaffective disorder:Yes  Duration of Psychotic Symptoms:Less than six months  Hallucinations:Hallucinations: None  Ideas of Reference:None  Suicidal Thoughts:Suicidal  Thoughts: No  Homicidal Thoughts:Homicidal Thoughts: No   Sensorium  Memory:Immediate Fair; Recent Fair  Judgment:Impaired  Insight:Lacking   Executive Functions  Concentration:Fair  Attention Span:Fair  [place]   Psychomotor Activity  Psychomotor Activity:Psychomotor Activity: Normal   Assets  Assets:Communication Skills; Resilience   Sleep  Sleep:Sleep: Good Number of Hours of Sleep: 9.25    Physical Exam: Physical Exam Vitals and nursing note reviewed.  Constitutional:      General: [name] not in acute distress.    Appearance: Normal appearance. [name] normal weight. [name] not ill-appearing or toxic-appearing.  HENT:     Head: Normocephalic and atraumatic.  Pulmonary:     Effort: Pulmonary effort is normal.  Musculoskeletal:        General: Normal range of motion.  Neurological:     General: No focal deficit present.     Mental Status: [name] alert.    Review of Systems  Respiratory:  Negative for cough and shortness of breath.   Cardiovascular:  Negative for chest pain.  Gastrointestinal:  Negative for abdominal pain, constipation, diarrhea, nausea and vomiting.  Neurological:  Positive for headaches (mild). Negative for dizziness and weakness.  Psychiatric/Behavioral:  Negative for depression, hallucinations and suicidal ideas. The patient is not nervous/anxious.    Blood pressure 111/79, pulse 91, temperature 98.2 F (36.8 C), temperature source Oral, resp. rate 16, height 5' 6"  (1.676 m), weight 72.6 kg, SpO2 (!) 87 %. Body mass index is 25.83 kg/m.   Treatment Plan Summary: Daily contact with patient to assess and evaluate symptoms and progress in treatment and Medication management  Chris Narasimhan is a 56 yr old female who presented on 5/17 to [facility] with complaints of anxiety and feeling overwhelmed and SI without a plan, she was admitted to [facility] on 5/19.      Kathlean continues to perseverate  on discharge and that she is able to take care of herself.  There is still concern for destabilization as her memory does seem to be worse and she perseverates more since stopping the Depakote so we will not be decreasing the Haldol at this time.  We will not make any medication changes at this time.  We will continue to monitor.     Bipolar I MRE manic with psychotic features (r/o schizoaffective d/o - bipolar type)             -Continue Haldol 5 mg AM and 10 mg QHS for psychosis and mood stabilization - hope to taper down or off as tolerated now that she is loaded with Kirt Boys.             -Continue Cogentin 0.5 mg twice daily             -Invega Sustenna 234 mg IM given on 09-30-21 122m IM given 10/12/20             -- Zyprexa/Geodon/ativan agitation protocol            -Additional psychosis labs (HIV- , RPR-, ANA-, heavy metal WNL, ESR wnl, ceruloplasmin wnl and CT head shows Marked for age brain atrophy.  No acute or reversible finding.)   -Depakote, Ativan, and Zyprexa stopped  Hyperammonemia: -Repeat ammonia level was trending down with last draw -Currently not compliant with blood draws for a repeat level. -Depakote was stopped     Mildly elevated creatinine  -Creatinine downtrending to 1.35 -> 1.20 -> 1.04 -We will keep on trending as she will cooperate for labs     Season allergies -Continue Claritin 42m daily PRN     Urinary frequency -repeat UA with rare bacteria, but other wise, WNL   Labs On Admission- CMP: WNL except Cr: 1.1,  GFR: 60,  CBC: WNL,  UDS: Neg,  Troponin: 3,  EtOH: Neg,  beta hCG: 6.4,  Resp Panel: Neg 5/29- Urine Preg: Neg,  A1c: 5.3,  TSH: 1.719,  Lipid Panel: WNL except LDL: 101 5/24- BMP: WNL except Cr: 1.01,  Ca: 8.4,  hCG: 5,  Urine Preg: Neg 5/28- Ammonia: 32,  CBC: WNL,  CMP: WNL except  Cr: 1.08,  Ca: 8.6,  Depakote lvl: 20 6/3- Depakote lvl: 64, Hep Function: WNL,  CBC: WNL except Hem: 11.9 6/7- Depakote lvl: 75 6/18- UA: Neg,   CBC: WNL,  Depakote lvl: 136,  Ammonia: 99,  BMP: WNL except BUN: 22,  Cr: 1.19,  Ca: 8.6,  GFR: 54,  Hep Function: WNL except Albu: 3.3,  Indirect Bili: 0.2 6/20- ANA: Neg,  Ammonia: 48,  Ceruloplasmin: 24.2,  CMP: WNL except BUN: 23,  Cr: 1.35,  Ca: 8.3,  Albu: 3.4  HIV:Neg,  RPR: Neg,  Sed Rate: 6,  Depakote lvl: 25,  Heavy Metal: Neg 6/23- Ammonia: 46,  BMP: WNL except Cr: 1.2,  Ca: 8.3,  GFR: 54 6/24- UA: WNL except Bacteria: rare 6/30- CMP:  WNL except Cr: 1.04,  Ca: 8.6,  CBC: WNL,  Ammonia: 41, EKG: NSR with Qtc: 3[place] MD 10/20/2021, 10:28 AM

## 2021-10-20 NOTE — Progress Notes (Signed)
Did not attend group 

## 2021-10-20 NOTE — Group Note (Addendum)
  [facility] LCSW Group Therapy Note  Date/Time:  10/20/2021 11:15AM-12:00PM  Type of Therapy and Topic:  Group Therapy:  Feelings About Hospitalization  Participation Level:  Minimal   Description of Group This process group involved patients discussing their feelings related to being hospitalized, as well as the benefits they see to being in the hospital.  These feelings and benefits were itemized.  The group then brainstormed specific ways in which they could seek those same benefits when they discharge and return home.  Therapeutic Goals Patient will identify and describe positive and negative feelings related to hospitalization Patient will verbalize benefits of hospitalization to themselves personally Patients will brainstorm together ways they can obtain similar benefits in the outpatient setting, identify barriers to wellness and possible solutions  Summary of Patient Progress:  The patient expressed her primary feelings about being hospitalized are good.  When asked how she will take care of herself, she responded that she needs to move from her current storage unit to a new one because they increased the rent for no reason.  Throughout the group, her thinking was tangential like this.  Therapeutic Modalities Cognitive Behavioral Therapy Motivational Interviewing    Ambrose Mantle, LCSW 10/20/2021, 11:49 AM

## 2021-10-20 NOTE — [facility] Group Notes (Signed)
.  Psychoeducational Group Note  Date: 10/20/2021 Time: 0900-1000    Goal Setting   Purpose of Group: This group helps to provide patients with the steps of setting a goal that is specific, measurable, attainable, realistic and time specific. A discussion on how we keep ourselves stuck with negative self talk. Homework given for Patients to write 30 positive attributes about themselves.    Participation Level:  Active  Participation Quality:  Appropriate  Affect:  Appropriate  Cognitive:  Appropriate  Insight:  Improving  Engagement in Group:  Engaged  Additional Comments:  Rates her energy at a 10/10. Was able to sit through the entire group.  Dione Housekeeper

## 2021-10-20 NOTE — Progress Notes (Signed)
Pt continues to question why she is not being discharged. Pt c/o headache. The medical doctor provided verbal order of one time dose of Acetaminophen. Writer administered per physician order. Pt appears to be at her baseline. No c/o ideations and/or hallucinations.

## 2021-10-21 DIAGNOSIS — F3112 Bipolar disorder, current episode manic without psychotic features, moderate: Secondary | ICD-10-CM | POA: Diagnosis not present

## 2021-10-21 NOTE — Progress Notes (Signed)
Adult Psychoeducational Group Note  Date:  10/21/2021 Time:  10:34 PM  Group Topic/Focus:  Wrap-Up Group:   The focus of this group is to help patients review their daily goal of treatment and discuss progress on daily workbooks.  Participation Level:  Active  Participation Quality:  Appropriate  Affect:  Appropriate  Cognitive:  Disorganized and Confused  Insight: Appropriate  Engagement in Group:  Developing/Improving  Modes of Intervention:  Discussion  Additional Comments:  Pt stated her goal for today was to focus on her treatment plan and to discuss her medication issue with her doctor. Pt stated she accomplished her goal today. Pt stated she  talked with her doctor and with her social worker about her care today. Pt rated her overall day a 10. Pt stated her mother coming for visitation tonight improved her overall day. Pt stated she felt better about herself tonight. Pt stated attend all other meals today. Pt stated she took all medications provided today. Pt stated her appetite was good today. Pt rated her sleep last night was pretty good. Pt stated the goal tonight was to get some rest. Pt stated she had no physical pain tonight. Pt deny visual hallucinations and auditory issues tonight. Pt denies thoughts of harming herself or others. Pt stated she would alert staff if anything changed.  Felipa Furnace 10/21/2021, 10:34 PM

## 2021-10-21 NOTE — Progress Notes (Signed)

## 2021-10-21 NOTE — Group Note (Signed)
[facility] LCSW Group Therapy Note  Date/Time:  10/21/2021  11:00AM-12:00PM  Type of Therapy and Topic:  Group Therapy:  Music and Mood  Participation Level:  Active   Description of Group: In this process group, members listened to a variety of genres of music and identified that different types of music evoke different responses.  Patients were encouraged to identify music that was soothing for them and music that was energizing for them.  Patients discussed how this knowledge can help with wellness and recovery in various ways including managing depression and anxiety as well as encouraging healthy sleep habits.    Therapeutic Goals: Patients will explore the impact of different varieties of music on mood Patients will verbalize the thoughts they have when listening to different types of music Patients will identify music that is soothing to them as well as music that is energizing to them Patients will discuss how to use this knowledge to assist in maintaining wellness and recovery Patients will explore the use of music as a coping skill  Summary of Patient Progress:  At the beginning of group, patient expressed her mood was "normal."  At the end of group, patient expressed her mood was excellent, and she expressed appreciation for the music.    Therapeutic Modalities: Solution Focused Brief Therapy Activity   Ambrose Mantle, LCSW

## 2021-10-21 NOTE — Progress Notes (Signed)
[facility] MD Progress Note  10/21/2021 11:35 AM Natalie Brown  MRN:  [id number] Subjective:   Natalie Brown is a 56 yr old female who presented on 5/17 to [facility] with complaints of anxiety and feeling overwhelmed and SI without a plan, she was admitted to [facility] on 5/19.  PPHx is significant for possible Bipolar Disorder.   Case was discussed in the multidisciplinary team. MAR was reviewed and patient was compliant with medications.  She did not receive any PRN medications yesterday.   Psychiatric Team made the following recommendations yesterday: -Continue Haldol 5 mg AM and 10 mg QHS -Continue Cogentin to 0.5 mg BID -Invega Sustenna 234 mg IM given on 09-30-21 166m IM given 10/12/20   On interview today patient reports she slept good last night.  She reports her appetite is doing good.  She reports no SI, HI, or AVH.  She reports no Paranoia, Ideas of Reference, or other First Rank symptoms.  She reports no issues with her medications.  She continues to state that she needs to be discharged, that she cannot stay here because this is a hospital and that her sister's birthday tomorrow.  She states she always thinks part and a gift card for her and will be able to do that here.  She also states she needs her phone charge because it is a "Building services engineer"  When asked why she needs the phone to be charged and if there is something staff can help her with she states no she just needs a charge because it is a "Building services engineer"  Described to her again that we are continuing to work on obtaining Medicaid/Medicare and disability for her so that we can get her into a group home.  She states she is fine living on her own and that [facility] was able to get her a hotel room in 1 day when she was able to stay there for 3 months.  Discussed with her again that she needs assistance and help with her medications which require a group home and not simply a hotel room.  She continues to state she needs to be discharged and have her  "Federal phone" charge.  She reports no concerns for them.    Principal Problem: Bipolar affective disorder, current episode manic with psychotic symptoms ([place] Diagnosis: Principal Problem:   Bipolar affective disorder, current episode manic with psychotic symptoms ([place] Active Problems:   Increased ammonia level   Valproic acid causing adverse effect in therapeutic use   Altered mental status  Total Time spent with patient:  I personally spent 30 minutes on the unit in direct patient care. The direct patient care time included face-to-face time with the patient, reviewing the patient's chart, communicating with other professionals, and coordinating care. Greater than 50% of this time was spent in counseling or coordinating care with the patient regarding goals of hospitalization, psycho-education, and discharge planning needs.   Past Psychiatric History: Probable bipolar disorder  Past Medical History:  Past Medical History:  Diagnosis Date   Bipolar 1 disorder with moderate mania ([place] 12/30/2013   Hypertension    History reviewed. No pertinent surgical history. Family History: History reviewed. No pertinent family history. Family Psychiatric  History: Patient reports mental illness runs in her father's side of the family Social History:  Social History   Substance and Sexual Activity  Alcohol Use No     Social History   Substance and Sexual Activity  Drug Use No    Social History  Socioeconomic History   Marital status: Married    Spouse name: Not on file   Number of children: Not on file   Years of education: Not on file   Highest education level: Not on file  Occupational History   Not on file  Tobacco Use   Smoking status: Every Day   Smokeless tobacco: Current  Substance and Sexual Activity   Alcohol use: No   Drug use: No   Sexual activity: Not on file  Other Topics Concern   Not on file  Social History Narrative   Not on file   Social Determinants  of Health   Financial Resource Strain: Not on file  Food Insecurity: Not on file  Transportation Needs: Not on file  Physical Activity: Not on file  Stress: Not on file  Social Connections: Not on file   Additional Social History:                         Sleep: Good  Appetite:  Good  Current Medications: Current Facility-Administered Medications  Medication Dose Route Frequency Provider Last Rate Last Admin   acetaminophen (TYLENOL) tablet 650 mg  650 mg Oral Q6H PRN Briant Cedar, MD       alum & mag hydroxide-simeth (MAALOX/MYLANTA) 200-200-20 MG/5ML suspension 30 mL  30 mL Oral Q4H PRN Massengill, Nathan, MD       benztropine (COGENTIN) tablet 0.5 mg  0.5 mg Oral BID Rosita Kea, Vandana, MD   0.5 mg at 10/21/21 0804   feeding supplement (KATE FARMS STANDARD 1.4) liquid 325 mL  325 mL Oral BID BM Massengill, Nathan, MD   325 mL at 10/21/21 1033   fluticasone (FLONASE) 50 MCG/ACT nasal spray 1 spray  1 spray Each Nare Daily Massengill, [name], MD   1 spray at 10/21/21 0804   haloperidol (HALDOL) tablet 10 mg  10 mg Oral QHS Nelda Marseille, Amy E, MD   10 mg at 10/20/21 1951   haloperidol (HALDOL) tablet 5 mg  5 mg Oral Daily Viann Fish E, MD   5 mg at 10/21/21 0254   hydrocerin (EUCERIN) cream   Topical BID Janine Limbo, MD   Given at 10/21/21 2706   hydrOXYzine (ATARAX) tablet 25 mg  25 mg Oral TID PRN Janine Limbo, MD   25 mg at 10/21/21 0804   LORazepam (ATIVAN) tablet 1 mg  1 mg Oral Q6H PRN Viann Fish E, MD   1 mg at 10/07/21 1546   magnesium hydroxide (MILK OF MAGNESIA) suspension 30 mL  30 mL Oral Daily PRN Massengill, [name], MD       nicotine polacrilex (NICORETTE) gum 2 mg  2 mg Oral PRN Winfred Leeds, Nadir, MD   2 mg at 10/19/21 0726   OLANZapine zydis (ZYPREXA) disintegrating tablet 5 mg  5 mg Oral Q8H PRN Viann Fish E, MD   5 mg at 10/14/21 1416   And   ziprasidone (GEODON) injection 20 mg  20 mg Intramuscular PRN Harlow Asa, MD         Lab Results:  No results found for this or any previous visit (from the past 8 hour(s)).   Blood Alcohol level:  Lab Results  Component Value Date   ETH <10 23/76/2831    Metabolic Disorder Labs: Lab Results  Component Value Date   HGBA1C 5.3 09/08/2021   MPG 105.41 09/08/2021   No results found for: "PROLACTIN" Lab Results  Component Value Date   CHOL 173  09/08/2021   TRIG 110 09/08/2021   HDL 50 09/08/2021   CHOLHDL 3.5 09/08/2021   VLDL 22 09/08/2021   LDLCALC 101 (H) 09/08/2021   LDLCALC 102 (H) 06/21/2020    Physical Findings:  AIMS: Facial and Oral Movements Muscles of Facial Expression: None, normal Lips and Perioral Area: None, normal Jaw: None, normal Tongue: Minimal,Extremity Movements Upper (arms, wrists, hands, fingers): None, normal Lower (legs, knees, ankles, toes): None, normal, Trunk Movements Neck, shoulders, hips: None, normal, Overall Severity Severity of abnormal movements (highest score from questions above): Minimal Incapacitation due to abnormal movements: None, normal Patient's awareness of abnormal movements (rate only patient's report): No Awareness, Dental Status Current problems with teeth and/or dentures?: No Does patient usually wear dentures?: No  No Cogwheeling or Rigidity Present.   Musculoskeletal: Strength & Muscle Tone: within normal limits Gait & Station: normal Patient leans: N/A  Psychiatric Specialty Exam:  Presentation  General Appearance: Appropriate for Environment; Casual  Eye Contact:Good  Speech:Clear and Coherent; Normal Rate  Speech Volume:Normal  Handedness:Right   Mood and Affect  Mood:Dysphoric  Affect:Appropriate; Congruent   Thought Process  Thought Processes:Disorganized; Irrevelant  Descriptions of Associations:Tangential  Orientation:Partial  Thought Content:Illogical; Tangential; Scattered (Perseverates on discharge and her phone) No SI, HI, or AVH. No Paranoia, Ideas of  Reference, or First Rank symptoms.  History of Schizophrenia/Schizoaffective disorder:Yes  Duration of Psychotic Symptoms:Less than six months  Hallucinations:Hallucinations: None  Ideas of Reference:None  Suicidal Thoughts:Suicidal Thoughts: No  Homicidal Thoughts:Homicidal Thoughts: No   Sensorium  Memory:Immediate Fair; Recent Fair  Judgment:Impaired  Insight:Lacking   Executive Functions  Concentration:Fair  Attention Span:Poor  Recall:Poor  Fund of Knowledge:Fair  Language:Fair   Psychomotor Activity  Psychomotor Activity:Psychomotor Activity: Normal   Assets  Assets:Communication Skills; Resilience   Sleep  Sleep:Sleep: Good Number of Hours of Sleep: 0 (not charted)    Physical Exam: Physical Exam Vitals and nursing note reviewed.  Constitutional:      General: [name] not in acute distress.    Appearance: Normal appearance. [name] normal weight. [name] not ill-appearing or toxic-appearing.  HENT:     Head: Normocephalic and atraumatic.  Pulmonary:     Effort: Pulmonary effort is normal.  Musculoskeletal:        General: Normal range of motion.  Neurological:     General: No focal deficit present.     Mental Status: [name] alert.    Review of Systems  Respiratory:  Negative for cough and shortness of breath.   Cardiovascular:  Negative for chest pain.  Gastrointestinal:  Negative for abdominal pain, constipation, diarrhea, nausea and vomiting.  Neurological:  Positive for headaches (mild). Negative for dizziness and weakness.  Psychiatric/Behavioral:  Negative for depression, hallucinations and suicidal ideas. The patient is not nervous/anxious.    Blood pressure 118/73, pulse 80, temperature 97.7 F (36.5 C), temperature source Oral, resp. rate 16, height 5' 6"  (1.676 m), weight 72.6 kg, SpO2 100 %. Body mass index is 25.83 kg/m.   Treatment Plan Summary: Daily contact with patient to assess and evaluate symptoms and progress in  treatment and Medication management  Natalie Brown is a 56 yr old female who presented on 5/17 to [facility] with complaints of anxiety and feeling overwhelmed and SI without a plan, she was admitted to [facility] on 5/19.      Jarrah continues to perseverate on having her phone charged and being discharged despite explaining to her several times why she needs a group home.  She  also continues to question why she is not taking Depakote.  We will not make any medication changes at this time.  We will continue to monitor.     Bipolar I MRE manic with psychotic features (r/o schizoaffective d/o - bipolar type)             -Continue Haldol 5 mg AM and 10 mg QHS for psychosis and mood stabilization - hope to taper down or off as tolerated now that she is loaded with [employer].             -Continue Cogentin 0.5 mg twice daily             -Invega Sustenna 234 mg IM given on 09-30-21 173m IM given 10/12/20             -- Zyprexa/Geodon/ativan agitation protocol            -Additional psychosis labs (HIV- , RPR-, ANA-, heavy metal WNL, ESR wnl, ceruloplasmin wnl and CT head shows Marked for age brain atrophy.  No acute or reversible finding.)   -Depakote, Ativan, and Zyprexa stopped     Hyperammonemia: -Repeat ammonia level was trending down with last draw -Currently not compliant with blood draws for a repeat level. -Depakote was stopped     Mildly elevated creatinine  -Creatinine downtrending to 1.35 -> 1.20 -> 1.04 -We will keep on trending as she will cooperate for labs     Season allergies -Continue Claritin 163mdaily PRN     Urinary frequency -repeat UA with rare bacteria, but other wise, WNL   Labs On Admission- CMP: WNL except Cr: 1.1,  GFR: 60,  CBC: WNL,  UDS: Neg,  Troponin: 3,  EtOH: Neg,  beta hCG: 6.4,  Resp Panel: Neg 5/29- Urine Preg: Neg,  A1c: 5.3,  TSH: 1.719,  Lipid Panel: WNL except LDL: 101 5/24- BMP: WNL except Cr: 1.01,  Ca: 8.4,  hCG: 5,  Urine Preg: Neg 5/28-  Ammonia: 32,  CBC: WNL,  CMP: WNL except  Cr: 1.08,  Ca: 8.6,  Depakote lvl: 20 6/3- Depakote lvl: 64, Hep Function: WNL,  CBC: WNL except Hem: 11.9 6/7- Depakote lvl: 75 6/18- UA: Neg,  CBC: WNL,  Depakote lvl: 136,  Ammonia: 99,  BMP: WNL except BUN: 22,  Cr: 1.19,  Ca: 8.6,  GFR: 54,  Hep Function: WNL except Albu: 3.3,  Indirect Bili: 0.2 6/20- ANA: Neg,  Ammonia: 48,  Ceruloplasmin: 24.2,  CMP: WNL except BUN: 23,  Cr: 1.35,  Ca: 8.3,  Albu: 3.4  HIV:Neg,  RPR: Neg,  Sed Rate: 6,  Depakote lvl: 25,  Heavy Metal: Neg 6/23- Ammonia: 46,  BMP: WNL except Cr: 1.2,  Ca: 8.3,  GFR: 54 6/24- UA: WNL except Bacteria: rare 6/30- CMP:  WNL except Cr: 1.04,  Ca: 8.6,  CBC: WNL,  Ammonia: 41, EKG: NSR with Qtc: 39[place]st HillsMD 10/21/2021, 11:35 AM

## 2021-10-21 NOTE — Progress Notes (Signed)
   10/21/21 0900  Psych Admission Type (Psych Patients Only)  Admission Status Involuntary  Psychosocial Assessment  Patient Complaints Confusion  Eye Contact Fair  Facial Expression Worried  Affect Anxious;Preoccupied  Speech Unremarkable  Interaction Assertive  Motor Activity Slow  Appearance/Hygiene Layered clothes  Behavior Characteristics Cooperative  Mood Anxious  Aggressive Behavior  Effect No apparent injury  Thought Process  Coherency Disorganized  Content Preoccupation  Delusions None reported or observed  Perception WDL  Hallucination None reported or observed  Judgment Impaired  Confusion None  Danger to Self  Current suicidal ideation? Denies  Agreement Not to Harm Self Yes  Description of Agreement verbal  Danger to Others  Danger to Others None reported or observed

## 2021-10-21 NOTE — [facility] Group Notes (Signed)
Adult Psychoeducational Group Note Date:  10/21/2021 Time:  0900-1045 Group Topic/Focus: PROGRESSIVE RELAXATION. A group where deep breathing is taught and tensing and relaxation muscle groups is used. Imagery is used as well.  Pts are asked to imagine 3 pillars that hold them up when they are not able to hold themselves up and to share that with the group.  Participation Level:  Active  Participation Quality:  Appropriate  Affect:  Appropriate  Cognitive:  Oriented  Insight: Improving  Engagement in Group:  Engaged  Modes of Intervention:  Activity, Discussion, Education, and Support  Additional Comments:  Rates her energy at an 8/10. States the hospital, the doctors, her mom and her sister hold her up.  Dione Housekeeper

## 2021-10-21 NOTE — Progress Notes (Signed)
   10/21/21 2100  Psych Admission Type (Psych Patients Only)  Admission Status Involuntary  Psychosocial Assessment  Patient Complaints None  Eye Contact Brief  Facial Expression Worried  Affect Appropriate to circumstance  Speech Unremarkable  Interaction Assertive  Motor Activity Slow  Appearance/Hygiene Layered clothes  Behavior Characteristics Cooperative;Appropriate to situation  Mood Pleasant  Thought Process  Coherency Disorganized  Content Preoccupation  Delusions None reported or observed  Perception WDL  Hallucination None reported or observed  Judgment Poor  Confusion None  Danger to Self  Current suicidal ideation? Denies  Agreement Not to Harm Self Yes  Description of Agreement verbally  Danger to Others  Danger to Others None reported or observed

## 2021-10-22 DIAGNOSIS — F312 Bipolar disorder, current episode manic severe with psychotic features: Secondary | ICD-10-CM | POA: Diagnosis not present

## 2021-10-22 NOTE — Progress Notes (Signed)
   10/22/21 2000  Psych Admission Type (Psych Patients Only)  Admission Status Involuntary  Psychosocial Assessment  Patient Complaints None  Eye Contact Brief  Facial Expression Worried  Affect Preoccupied  Speech Slow  Interaction Assertive  Motor Activity Slow  Appearance/Hygiene Layered clothes  Behavior Characteristics Cooperative  Mood Preoccupied  Aggressive Behavior  Effect No apparent injury  Thought Process  Coherency Disorganized  Content Preoccupation  Delusions None reported or observed  Perception WDL  Hallucination None reported or observed  Judgment Impaired  Confusion None  Danger to Self  Current suicidal ideation? Denies  Danger to Others  Danger to Others None reported or observed

## 2021-10-22 NOTE — Group Note (Signed)
LCSW Group Therapy Note  Group Date: 10/22/2021 Start Time: 1300 End Time: 1400   Type of Therapy and Topic:  Group Therapy: Anger Cues and Responses  Participation Level:  Active   Description of Group:   In this group, patients learned how to recognize the physical, cognitive, emotional, and behavioral responses they have to anger-provoking situations.  They identified a recent time they became angry and how they reacted.  They analyzed how their reaction was possibly beneficial and how it was possibly unhelpful.  The group discussed a variety of healthier coping skills that could help with such a situation in the future.  Focus was placed on how helpful it is to recognize the underlying emotions to our anger, because working on those can lead to a more permanent solution as well as our ability to focus on the important rather than the urgent.  Therapeutic Goals: Patients will remember their last incident of anger and how they felt emotionally and physically, what their thoughts were at the time, and how they behaved. Patients will identify how their behavior at that time worked for them, as well as how it worked against them. Patients will explore possible new behaviors to use in future anger situations. Patients will learn that anger itself is normal and cannot be eliminated, and that healthier reactions can assist with resolving conflict rather than worsening situations.  Summary of Patient Progress:  Natalie Brown was active during the group. She shared a recent occurrence wherein feeling like she didn't have control over her dischcarge led to anger. She demonstrated fair insight into the subject matter, was respectful of peers, and participated throughout the entire session.  Therapeutic Modalities:   Cognitive Behavioral Therapy    Beatris Si, LCSW 10/22/2021  2:06 PM

## 2021-10-22 NOTE — Progress Notes (Signed)
[facility] MD Progress Note  10/22/2021 3:19 PM Natalie Brown  MRN:  [id number] Subjective: Natalie Brown reports: "I'm okay..I want to go home so bad.Marland KitchenMarland KitchenI want to leave so bad. I want to leave before July 4th...this is not good."  HPI: Natalie Brown is a 56 yr old female who presented on 5/17 to [facility] with complaints of anxiety and feeling overwhelmed and SI without a plan, she was admitted to [facility] on 5/19.  PPHx is significant for possible Bipolar Disorder.  Today's patient assessment note: Pt's chart reviewed, her case discussed with the treatment team. Pt has been compliant with her scheduled medications over the past 24 hrs. She required a dose of Vistaril 25 mg last night for anxiety.  She has  attended some group sessions. Pt seen during this encounter in her room on the 500 hall. She presents with a depressed mood, and affect is congruent. Her attention to personal hygiene and grooming is fair, eye contact is good, speech is clear & coherent. Thought contents are unorganized & illogical, and pt currently denies SI/HI/AVH. She perseverates, and is tangential in her responses to questions.  She answers with answers that are irrelevant to questions being asked, and also presents with flight of ideas. Writer inquired how pt is doing today and she repeated over and over again that she would like to leave the hospital, but then, began stating that it is her sister's birthday today, and her sister looks nothing like her, is half [place], she called and wished her a happy birthday, etc.  Pt reports a good sleep quality last night, and reports a "pretty good" appetite. She denies any medication related side effects, and there are no signs/symptoms of EPS of medications noted today. She denies any other concerns with the exception of stating that she wants to be discharged. Pt again asked today if she can allow for blood draws to recheck her ammonia level, but continues to refuse, stating that she is not being paid for  blood draws, and does not comprehend the rationale for blood draws when it is explained to her. Last ammonia level was 41 (6/30). Will continue pt's medications with no changes as listed below.  Principal Problem: Bipolar affective disorder, current episode manic with psychotic symptoms ([place]) Diagnosis: Principal Problem:   Bipolar affective disorder, current episode manic with psychotic symptoms ([place]) Active Problems:   Increased ammonia level   Valproic acid causing adverse effect in therapeutic use   Altered mental status  Total Time spent with patient:  I personally spent 30 minutes on the unit in direct patient care. The direct patient care time included face-to-face time with the patient, reviewing the patient's chart, communicating with other professionals, and coordinating care. Greater than 50% of this time was spent in counseling or coordinating care with the patient regarding goals of hospitalization, psycho-education, and discharge planning needs.   Past Psychiatric History: Probable bipolar disorder  Past Medical History:  Past Medical History:  Diagnosis Date   Bipolar 1 disorder with moderate mania ([place]) 12/30/2013   Hypertension    History reviewed. No pertinent surgical history. Family History: History reviewed. No pertinent family history. Family Psychiatric  History: Patient reports mental illness runs in her father's side of the family Social History:  Social History   Substance and Sexual Activity  Alcohol Use No     Social History   Substance and Sexual Activity  Drug Use No    Social History   Socioeconomic History   Marital status:  Married    Spouse name: Not on file   Number of children: Not on file   Years of education: Not on file   Highest education level: Not on file  Occupational History   Not on file  Tobacco Use   Smoking status: Every Day   Smokeless tobacco: Current  Substance and Sexual Activity   Alcohol use: No   Drug use: No    Sexual activity: Not on file  Other Topics Concern   Not on file  Social History Narrative   Not on file   Social Determinants of Health   Financial Resource Strain: Not on file  Food Insecurity: Not on file  Transportation Needs: Not on file  Physical Activity: Not on file  Stress: Not on file  Social Connections: Not on file   Additional Social History:   Sleep: Good  Appetite:  Good  Current Medications: Current Facility-Administered Medications  Medication Dose Route Frequency Provider Last Rate Last Admin   acetaminophen (TYLENOL) tablet 650 mg  650 mg Oral Q6H PRN Briant Cedar, MD       alum & mag hydroxide-simeth (MAALOX/MYLANTA) 200-200-20 MG/5ML suspension 30 mL  30 mL Oral Q4H PRN Massengill, Nathan, MD       benztropine (COGENTIN) tablet 0.5 mg  0.5 mg Oral BID Rosita Kea, Vandana, MD   0.5 mg at 10/22/21 0826   feeding supplement (KATE FARMS STANDARD 1.4) liquid 325 mL  325 mL Oral BID BM Massengill, Nathan, MD   325 mL at 10/22/21 1502   fluticasone (FLONASE) 50 MCG/ACT nasal spray 1 spray  1 spray Each Nare Daily Massengill, [name], MD   1 spray at 10/22/21 0826   haloperidol (HALDOL) tablet 10 mg  10 mg Oral QHS Viann Fish E, MD   10 mg at 10/21/21 2058   haloperidol (HALDOL) tablet 5 mg  5 mg Oral Daily Harlow Asa, MD   5 mg at 10/22/21 7353   hydrocerin (EUCERIN) cream   Topical BID Janine Limbo, MD   Given at 10/22/21 0827   hydrOXYzine (ATARAX) tablet 25 mg  25 mg Oral TID PRN Janine Limbo, MD   25 mg at 10/21/21 2059   LORazepam (ATIVAN) tablet 1 mg  1 mg Oral Q6H PRN Viann Fish E, MD   1 mg at 10/07/21 1546   magnesium hydroxide (MILK OF MAGNESIA) suspension 30 mL  30 mL Oral Daily PRN Massengill, [name], MD       nicotine polacrilex (NICORETTE) gum 2 mg  2 mg Oral PRN Winfred Leeds, Nadir, MD   2 mg at 10/19/21 0726   OLANZapine zydis (ZYPREXA) disintegrating tablet 5 mg  5 mg Oral Q8H PRN Viann Fish E, MD   5 mg at 10/14/21 1416    And   ziprasidone (GEODON) injection 20 mg  20 mg Intramuscular PRN Harlow Asa, MD       Lab Results:  No results found for this or any previous visit (from the past 35 hour(s)).  Blood Alcohol level:  Lab Results  Component Value Date   ETH <10 29/92/4268   Metabolic Disorder Labs: Lab Results  Component Value Date   HGBA1C 5.3 09/08/2021   MPG 105.41 09/08/2021   No results found for: "PROLACTIN" Lab Results  Component Value Date   CHOL 173 09/08/2021   TRIG 110 09/08/2021   HDL 50 09/08/2021   CHOLHDL 3.5 09/08/2021   VLDL 22 09/08/2021   LDLCALC 101 (H) 09/08/2021   LDLCALC 102 (  H) 06/21/2020   Physical Findings:  AIMS: Facial and Oral Movements Muscles of Facial Expression: None, normal Lips and Perioral Area: None, normal Jaw: None, normal Tongue: Minimal,Extremity Movements Upper (arms, wrists, hands, fingers): None, normal Lower (legs, knees, ankles, toes): None, normal, Trunk Movements Neck, shoulders, hips: None, normal, Overall Severity Severity of abnormal movements (highest score from questions above): Minimal Incapacitation due to abnormal movements: None, normal Patient's awareness of abnormal movements (rate only patient's report): No Awareness, Dental Status Current problems with teeth and/or dentures?: No Does patient usually wear dentures?: No  No Cogwheeling or Rigidity Present.  Musculoskeletal: Strength & Muscle Tone: within normal limits Gait & Station: normal Patient leans: N/A  Psychiatric Specialty Exam:  Presentation  General Appearance: Appropriate for Environment; Fairly Groomed  Eye Contact:Good  Speech:Clear and Coherent  Speech Volume:Normal  Handedness:Right  Mood and Affect  Mood:Depressed  Affect:Congruent  Thought Process  Thought Processes:Coherent  Descriptions of Associations:Tangential  Orientation:Partial  Thought Content:Illogical; Perseveration; Rumination No SI, HI, or AVH. No Paranoia, Ideas  of Reference, or First Rank symptoms.  History of Schizophrenia/Schizoaffective disorder:No  Duration of Psychotic Symptoms:N/A  Hallucinations:Hallucinations: None  Ideas of Reference:Paranoia  Suicidal Thoughts:Suicidal Thoughts: No  Homicidal Thoughts:Homicidal Thoughts: No  Sensorium  Memory:Immediate Good  Judgment:Poor  Insight:Poor  Executive Functions  Concentration:Poor  Attention Span:Fair  Recall:Poor  Fund of Knowledge:Poor  Language:Fair  Psychomotor Activity  Psychomotor Activity:Psychomotor Activity: Normal  Assets  Assets:Resilience  Sleep  Sleep:Sleep: Good Number of Hours of Sleep: 0 (not charted)  Physical Exam: Physical Exam Vitals and nursing note reviewed.  Constitutional:      General: [name] not in acute distress.    Appearance: Normal appearance. [name] normal weight. [name] not ill-appearing or toxic-appearing.  HENT:     Head: Normocephalic and atraumatic.  Pulmonary:     Effort: Pulmonary effort is normal.  Musculoskeletal:        General: Normal range of motion.  Neurological:     General: No focal deficit present.     Mental Status: [name] alert.    Review of Systems  Respiratory:  Negative for cough and shortness of breath.   Cardiovascular:  Negative for chest pain.  Gastrointestinal:  Negative for abdominal pain, constipation, diarrhea, nausea and vomiting.  Neurological:  Positive for headaches (mild). Negative for dizziness and weakness.  Psychiatric/Behavioral:  Negative for hallucinations, memory loss, substance abuse and suicidal ideas. The patient is not nervous/anxious and does not have insomnia.    Blood pressure 104/72, pulse 98, temperature 97.8 F (36.6 C), temperature source Oral, resp. rate 16, height 5' 6"  (1.676 m), weight 72.6 kg, SpO2 100 %. Body mass index is 25.83 kg/m.  Treatment Plan Summary: Daily contact with patient to assess and evaluate symptoms and progress in treatment and Medication  management  Safety and Monitoring: Voluntary admission to inpatient psychiatric unit for safety, stabilization and treatment Daily contact with patient to assess and evaluate symptoms and progress in treatment Patient's case to be discussed in multi-disciplinary team meeting Observation Level : q15 minute checks Vital signs: q12 hours Precautions: safety   Bipolar I MRE manic with psychotic features (r/o schizoaffective d/o - bipolar type)             -Continue Haldol 5 mg AM and 10 mg QHS for psychosis and mood stabilization - hope to taper down or off as tolerated now that she is loaded with [employer].             -  Continue Cogentin 0.5 mg twice daily             -Invega Sustenna 234 mg IM given on 09-30-21 144m IM given 10/12/20             -- Zyprexa/Geodon/ativan agitation protocol            -Additional psychosis labs (HIV- , RPR-, ANA-, heavy metal WNL, ESR wnl, ceruloplasmin wnl and CT head shows Marked for age brain atrophy.  No acute or reversible finding.)   -Depakote, Ativan, and Zyprexa stopped    Hyperammonemia: -Repeat ammonia level was trending down with last draw -Currently not compliant with blood draws for a repeat level. -Depakote was stopped    Mildly elevated creatinine  -Creatinine downtrending to 1.35 -> 1.20 -> 1.04 -We will keep on trending as she will cooperate for labs    Season allergies -Continue Claritin 125mdaily PRN    Urinary frequency -repeat UA with rare bacteria, but other wise, WNL  Discharge Planning: Social work and case management to assist with discharge planning and identification of hospital follow-up needs prior to discharge Estimated LOS: 5-7 days Discharge Concerns: Need to establish a safety plan; Medication compliance and effectiveness Discharge Goals: Return home with outpatient referrals for mental health follow-up including medication management/psychotherapy Labs On Admission- CMP: WNL except Cr: 1.1,  GFR: 60,  CBC:  WNL,  UDS: Neg,  Troponin: 3,  EtOH: Neg,  beta hCG: 6.4,  Resp Panel: Neg 5/29- Urine Preg: Neg,  A1c: 5.3,  TSH: 1.719,  Lipid Panel: WNL except LDL: 101 5/24- BMP: WNL except Cr: 1.01,  Ca: 8.4,  hCG: 5,  Urine Preg: Neg 5/28- Ammonia: 32,  CBC: WNL,  CMP: WNL except  Cr: 1.08,  Ca: 8.6,  Depakote lvl: 20 6/3- Depakote lvl: 64, Hep Function: WNL,  CBC: WNL except Hem: 11.9 6/7- Depakote lvl: 75 6/18- UA: Neg,  CBC: WNL,  Depakote lvl: 136,  Ammonia: 99,  BMP: WNL except BUN: 22,  Cr: 1.19,  Ca: 8.6,  GFR: 54,  Hep Function: WNL except Albu: 3.3,  Indirect Bili: 0.2 6/20- ANA: Neg,  Ammonia: 48,  Ceruloplasmin: 24.2,  CMP: WNL except BUN: 23,  Cr: 1.35,  Ca: 8.3,  Albu: 3.4  HIV:Neg,  RPR: Neg,  Sed Rate: 6,  Depakote lvl: 25,  Heavy Metal: Neg 6/23- Ammonia: 46,  BMP: WNL except Cr: 1.2,  Ca: 8.3,  GFR: 54 6/24- UA: WNL except Bacteria: rare 6/30- CMP:  WNL except Cr: 1.04,  Ca: 8.6,  CBC: WNL,  Ammonia: 41, EKG: NSR with Qtc: 39Franklin SpringsNP 10/22/2021, 3:19 PMPatient ID: AnManuella Ghazifemale   DOB: [id number].   MRN: [id number]

## 2021-10-22 NOTE — Progress Notes (Signed)
   10/22/21 1500  Psych Admission Type (Psych Patients Only)  Admission Status Involuntary  Psychosocial Assessment  Patient Complaints None  Eye Contact Brief  Facial Expression Worried  Affect Preoccupied  Speech Unremarkable  Interaction Assertive  Motor Activity Slow  Appearance/Hygiene Layered clothes  Behavior Characteristics Cooperative  Mood Preoccupied  Thought Process  Coherency Disorganized  Content Preoccupation  Delusions None reported or observed  Perception WDL  Hallucination None reported or observed  Judgment Poor  Confusion None  Danger to Self  Current suicidal ideation? Denies  Danger to Others  Danger to Others None reported or observed

## 2021-10-22 NOTE — Progress Notes (Signed)
Adult Psychoeducational Group Note  Date:  10/22/2021 Time:  10:54 PM  Group Topic/Focus:  Wrap-Up Group:   The focus of this group is to help patients review their daily goal of treatment and discuss progress on daily workbooks.  Participation Level:  Active  Participation Quality:  Appropriate  Affect:  Appropriate  Cognitive:  Appropriate  Insight: Appropriate  Engagement in Group:  Developing/Improving  Modes of Intervention:  Discussion  Additional Comments:  Pt stated her goal for today was to focus on her treatment plan. Pt stated she accomplished her goal today. Pt stated she  talked with her doctor and with her social worker about her care today. Pt rated her overall day a 4 out of 10. Pt stated  she was able to contact her sister today which improved her over day. Pt stated she felt better about herself tonight. Pt stated attend all other meals today. Pt stated she took all medications provided today. Pt stated her appetite was fair today. Pt rated her sleep last night was pretty good. Pt stated the goal tonight was to get some rest. Pt stated she had some physical pain tonight. Pt stated she had a mild headache tonight. Pt rated the mild headache a 3 on the pain level scale. Pt nurse was updated on the situation. Pt deny visual hallucinations and auditory issues tonight. Pt denies thoughts of harming herself or others. Pt stated she would alert staff if anything changed  Natalie Brown 10/22/2021, 10:54 PM

## 2021-10-22 NOTE — Progress Notes (Signed)
The patient did not attend group.

## 2021-10-23 DIAGNOSIS — F312 Bipolar disorder, current episode manic severe with psychotic features: Secondary | ICD-10-CM | POA: Diagnosis not present

## 2021-10-23 NOTE — Progress Notes (Signed)
   10/23/21 0545  Sleep  Number of Hours 6    

## 2021-10-23 NOTE — Progress Notes (Signed)
[facility] MD Progress Note  10/23/2021 3:37 PM Natalie Brown  MRN:  [id number] Subjective: [name] reports: "I'm still angry that the person sitting with me at breakfast told me that he was going home. I'm the only one not going home and that makes me angry. I want to go home so bad.""  HPI: Natalie Brown is a 56 yr old female who presented on 5/17 to [facility] with complaints of anxiety and feeling overwhelmed and SI without a plan, she was admitted to [facility] on 5/19.  PPHx is significant for possible Bipolar Disorder.  Today's patient assessment note: Pt's chart reviewed, her case discussed with the treatment team. Pt has been compliant with her scheduled medications over the past 24 hrs. She required a dose of Vistaril 25 mg earlier today in the morning for anxiety.  She has  attended some unit group sessions. She presents today with a depressed mood, and affect is congruent. Her attention to personal hygiene and grooming is poor today, but the need to tend to personal hygiene has been reiterated and supplies provided.  Eye contact is good, speech is clear & coherent. Thought contents continue to be unorganized & illogical, and pt currently denies SI/HI/AVH. She continues to state that she does not want to allow blood draws for repeat ammonia levels because staff is not paying her for giving blood. She does not understand the rational for blood draws when it is explained to her.  She continues to perseverate, and is tangential in her responses to questions.  She reports wanting to be discharged because it is her sister's birthday , and she has been educated on the fact that we are currently awaiting for her to have benefits so that she can be able to have housing. She seems not to have insight regarding this.  Pt reports a good sleep quality last night, and reports a good appetite. She continues to deny any medication related side effects, and there are no signs/symptoms of EPS of medications noted on assessment  today. AIMS:0. Pt denies being in any physical discomfort. Will continue all medications as listed below.  Principal Problem: Bipolar affective disorder, current episode manic with psychotic symptoms ([place]) Diagnosis: Principal Problem:   Bipolar affective disorder, current episode manic with psychotic symptoms ([place]) Active Problems:   Increased ammonia level   Valproic acid causing adverse effect in therapeutic use   Altered mental status  Total Time spent with patient:  I personally spent 30 minutes on the unit in direct patient care. The direct patient care time included face-to-face time with the patient, reviewing the patient's chart, communicating with other professionals, and coordinating care. Greater than 50% of this time was spent in counseling or coordinating care with the patient regarding goals of hospitalization, psycho-education, and discharge planning needs.   Past Psychiatric History: Probable bipolar disorder  Past Medical History:  Past Medical History:  Diagnosis Date   Bipolar 1 disorder with moderate mania ([name]) 12/30/2013   Hypertension    History reviewed. No pertinent surgical history. Family History: History reviewed. No pertinent family history. Family Psychiatric  History: Patient reports mental illness runs in her father's side of the family Social History:  Social History   Substance and Sexual Activity  Alcohol Use No     Social History   Substance and Sexual Activity  Drug Use No    Social History   Socioeconomic History   Marital status: Married    Spouse name: Not on file   Number  of children: Not on file   Years of education: Not on file   Highest education level: Not on file  Occupational History   Not on file  Tobacco Use   Smoking status: Every Day   Smokeless tobacco: Current  Substance and Sexual Activity   Alcohol use: No   Drug use: No   Sexual activity: Not on file  Other Topics Concern   Not on file  Social History  Narrative   Not on file   Social Determinants of Health   Financial Resource Strain: Not on file  Food Insecurity: Not on file  Transportation Needs: Not on file  Physical Activity: Not on file  Stress: Not on file  Social Connections: Not on file   Additional Social History:   Sleep: Good  Appetite:  Good  Current Medications: Current Facility-Administered Medications  Medication Dose Route Frequency Provider Last Rate Last Admin   acetaminophen (TYLENOL) tablet 650 mg  650 mg Oral Q6H PRN Briant Cedar, MD   650 mg at 10/23/21 1135   alum & mag hydroxide-simeth (MAALOX/MYLANTA) 200-200-20 MG/5ML suspension 30 mL  30 mL Oral Q4H PRN Massengill, [name], MD       benztropine (COGENTIN) tablet 0.5 mg  0.5 mg Oral BID Armando Reichert, MD   0.5 mg at 10/23/21 0803   feeding supplement (KATE FARMS STANDARD 1.4) liquid 325 mL  325 mL Oral BID BM Massengill, Nathan, MD   325 mL at 10/23/21 1134   fluticasone (FLONASE) 50 MCG/ACT nasal spray 1 spray  1 spray Each Nare Daily Massengill, [name], MD   1 spray at 10/23/21 0802   haloperidol (HALDOL) tablet 10 mg  10 mg Oral QHS Nelda Marseille, Amy E, MD   10 mg at 10/22/21 2001   haloperidol (HALDOL) tablet 5 mg  5 mg Oral Daily Viann Fish E, MD   5 mg at 10/23/21 3151   hydrocerin (EUCERIN) cream   Topical BID Janine Limbo, MD   Given at 10/23/21 0804   hydrOXYzine (ATARAX) tablet 25 mg  25 mg Oral TID PRN Janine Limbo, MD   25 mg at 10/23/21 0802   LORazepam (ATIVAN) tablet 1 mg  1 mg Oral Q6H PRN Viann Fish E, MD   1 mg at 10/07/21 1546   magnesium hydroxide (MILK OF MAGNESIA) suspension 30 mL  30 mL Oral Daily PRN Massengill, [name], MD       nicotine polacrilex (NICORETTE) gum 2 mg  2 mg Oral PRN Winfred Leeds, Nadir, MD   2 mg at 10/19/21 0726   OLANZapine zydis (ZYPREXA) disintegrating tablet 5 mg  5 mg Oral Q8H PRN Viann Fish E, MD   5 mg at 10/14/21 1416   And   ziprasidone (GEODON) injection 20 mg  20 mg  Intramuscular PRN Harlow Asa, MD       Lab Results:  No results found for this or any previous visit (from the past 51 hour(s)).  Blood Alcohol level:  Lab Results  Component Value Date   ETH <10 76/16/0737   Metabolic Disorder Labs: Lab Results  Component Value Date   HGBA1C 5.3 09/08/2021   MPG 105.41 09/08/2021   No results found for: "PROLACTIN" Lab Results  Component Value Date   CHOL 173 09/08/2021   TRIG 110 09/08/2021   HDL 50 09/08/2021   CHOLHDL 3.5 09/08/2021   VLDL 22 09/08/2021   LDLCALC 101 (H) 09/08/2021   LDLCALC 102 (H) 06/21/2020   Physical Findings:  AIMS: Facial  and Oral Movements Muscles of Facial Expression: None, normal Lips and Perioral Area: None, normal Jaw: None, normal Tongue: None, normal,Extremity Movements Upper (arms, wrists, hands, fingers): None, normal Lower (legs, knees, ankles, toes): None, normal, Trunk Movements Neck, shoulders, hips: None, normal, Overall Severity Severity of abnormal movements (highest score from questions above): None, normal Incapacitation due to abnormal movements: None, normal Patient's awareness of abnormal movements (rate only patient's report): No Awareness, Dental Status Current problems with teeth and/or dentures?: No Does patient usually wear dentures?: No  No Cogwheeling or Rigidity Present.  Musculoskeletal: Strength & Muscle Tone: within normal limits Gait & Station: normal Patient leans: N/A  Psychiatric Specialty Exam:  Presentation  General Appearance: Disheveled  Eye Contact:Fair  Speech:Clear and Coherent  Speech Volume:Normal  Handedness:Right  Mood and Affect  Mood:Depressed  Affect:Congruent  Thought Process  Thought Processes:Coherent  Descriptions of Associations:Intact  Orientation:Partial  Thought Content:Illogical No SI, HI, or AVH. No Paranoia, Ideas of Reference, or First Rank symptoms.  History of Schizophrenia/Schizoaffective disorder:No  Duration  of Psychotic Symptoms:N/A  Hallucinations:Hallucinations: None  Ideas of Reference:Paranoia  Suicidal Thoughts:Suicidal Thoughts: No  Homicidal Thoughts:Homicidal Thoughts: No  Sensorium  Memory:Immediate Good; Recent Poor  Judgment:Poor  Insight:Poor  Executive Functions  Concentration:Fair  Attention Span:Fair  Recall:Poor  Fund of Knowledge:Poor  Language:Fair  Psychomotor Activity  Psychomotor Activity:Psychomotor Activity: Normal  Assets  Assets:Resilience  Sleep  Sleep:Sleep: Good  Physical Exam: Physical Exam Vitals and nursing note reviewed.  Constitutional:      General: [name] not in acute distress.    Appearance: Normal appearance. [name] normal weight. [name] not ill-appearing or toxic-appearing.  HENT:     Head: Normocephalic and atraumatic.  Pulmonary:     Effort: Pulmonary effort is normal.  Musculoskeletal:        General: Normal range of motion.  Neurological:     General: No focal deficit present.     Mental Status: [name] alert.    Review of Systems  Respiratory:  Negative for cough and shortness of breath.   Cardiovascular:  Negative for chest pain.  Gastrointestinal:  Negative for abdominal pain, constipation, diarrhea, nausea and vomiting.  Neurological:  Positive for headaches (mild). Negative for dizziness and weakness.  Psychiatric/Behavioral:  Negative for hallucinations, memory loss, substance abuse and suicidal ideas. The patient is not nervous/anxious and does not have insomnia.    Blood pressure 104/73, pulse (!) 102, temperature 98.2 F (36.8 C), temperature source Oral, resp. rate 16, height 5' 6"  (1.676 m), weight 72.6 kg, SpO2 100 %. Body mass index is 25.83 kg/m.  Treatment Plan Summary: Daily contact with patient to assess and evaluate symptoms and progress in treatment and Medication management  Safety and Monitoring: Voluntary admission to inpatient psychiatric unit for safety, stabilization and  treatment Daily contact with patient to assess and evaluate symptoms and progress in treatment Patient's case to be discussed in multi-disciplinary team meeting Observation Level : q15 minute checks Vital signs: q12 hours Precautions: safety   Bipolar I MRE manic with psychotic features (r/o schizoaffective d/o - bipolar type)             -Continue Haldol 5 mg AM and 10 mg QHS for psychosis and mood stabilization - hope to taper down or off as tolerated now that she is loaded with Kirt Boys.             -Continue Cogentin 0.5 mg twice daily             -  Invega Sustenna 234 mg IM given on 09-30-21 176m IM given 10/12/20             -- Zyprexa/Geodon/ativan agitation protocol            -Additional psychosis labs (HIV- , RPR-, ANA-, heavy metal WNL, ESR wnl, ceruloplasmin wnl and CT head shows Marked for age brain atrophy.  No acute or reversible finding.)   -Depakote, Ativan, and Zyprexa stopped    Hyperammonemia: -Repeat ammonia level was trending down with last draw-continuing to deny blood draws for repeat ammonia levels -Currently not compliant with blood draws for a repeat level. -Depakote was stopped    Mildly elevated creatinine  -Creatinine downtrending to 1.35 -> 1.20 -> 1.04 -We will keep on trending as she will cooperate for labs    Season allergies -Continue Claritin 138mdaily PRN    Urinary frequency -repeat UA with rare bacteria, but other wise, WNL  Discharge Planning: Social work and case management to assist with discharge planning and identification of hospital follow-up needs prior to discharge Estimated LOS: 5-7 days Discharge Concerns: Need to establish a safety plan; Medication compliance and effectiveness Discharge Goals: Return home with outpatient referrals for mental health follow-up including medication management/psychotherapy Labs On Admission- CMP: WNL except Cr: 1.1,  GFR: 60,  CBC: WNL,  UDS: Neg,  Troponin: 3,  EtOH: Neg,  beta hCG: 6.4,   Resp Panel: Neg 5/29- Urine Preg: Neg,  A1c: 5.3,  TSH: 1.719,  Lipid Panel: WNL except LDL: 101 5/24- BMP: WNL except Cr: 1.01,  Ca: 8.4,  hCG: 5,  Urine Preg: Neg 5/28- Ammonia: 32,  CBC: WNL,  CMP: WNL except  Cr: 1.08,  Ca: 8.6,  Depakote lvl: 20 6/3- Depakote lvl: 64, Hep Function: WNL,  CBC: WNL except Hem: 11.9 6/7- Depakote lvl: 75 6/18- UA: Neg,  CBC: WNL,  Depakote lvl: 136,  Ammonia: 99,  BMP: WNL except BUN: 22,  Cr: 1.19,  Ca: 8.6,  GFR: 54,  Hep Function: WNL except Albu: 3.3,  Indirect Bili: 0.2 6/20- ANA: Neg,  Ammonia: 48,  Ceruloplasmin: 24.2,  CMP: WNL except BUN: 23,  Cr: 1.35,  Ca: 8.3,  Albu: 3.4  HIV:Neg,  RPR: Neg,  Sed Rate: 6,  Depakote lvl: 25,  Heavy Metal: Neg 6/23- Ammonia: 46,  BMP: WNL except Cr: 1.2,  Ca: 8.3,  GFR: 54 6/24- UA: WNL except Bacteria: rare 6/30- CMP:  WNL except Cr: 1.04,  Ca: 8.6,  CBC: WNL,  Ammonia: 41, EKG: NSR with Qtc: 39IngramNP 10/23/2021, 3:37 PMPatient ID: AnManuella Ghazifemale   DOB: [id number].o.   MRN: [id number] ID: AnSHRISTI SCHEIBfemale   DOB: [id number].o.   MRN: [id number]

## 2021-10-23 NOTE — BHH Group Notes (Signed)
Patient attended orientation group and completed self inventory sheet.  

## 2021-10-23 NOTE — Progress Notes (Signed)
D.Patient continues to present as disorganized, but has been calm and cooperative, observed sitting in the dayroom with peers- minimal interaction between peers. Per pt's self inventory, pt rated her depression,hopelessness and anxiety all 8/10's. Pt reported that she slept well last night, has a good appetite and rated her energy level as 'normal'. Pt wrote that her goal was to "take meds and work on getting discharged." Pt currently denies SI/HI and AVH and does not appear to be responding to internal stimuli.  A. Labs and vitals monitored. Pt compliant with meds. Pt given prn Tylenol for complaints of headache-rating pain 6/10. Pt encouraged to express concerns and ask questions.   R. Pt remains safe with 15 minute checks. Will continue POC.

## 2021-10-23 NOTE — Progress Notes (Signed)
The focus of this group is to help patients review their daily goal of treatment and discuss progress on daily workbooks.  Pt attended the evening group but was unable to respond on-topic to discussion prompts from the Writer. Pt required frequent redirection from the Writer to keep from going too far off-topic.

## 2021-10-24 ENCOUNTER — Encounter (HOSPITAL_COMMUNITY): Payer: Self-pay

## 2021-10-24 DIAGNOSIS — F312 Bipolar disorder, current episode manic severe with psychotic features: Secondary | ICD-10-CM | POA: Diagnosis not present

## 2021-10-24 NOTE — BH IP Treatment Plan (Signed)
Interdisciplinary Treatment and Diagnostic Plan Update  10/24/2021 Time of Session: 10:45am  Natalie Brown MRN: [id number]  Principal Diagnosis: Bipolar affective disorder, current episode manic with psychotic symptoms (HCC)  Secondary Diagnoses: Principal Problem:   Bipolar affective disorder, current episode manic with psychotic symptoms (HCC) Active Problems:   Increased ammonia level   Valproic acid causing adverse effect in therapeutic use   Altered mental status   Current Medications:  Current Facility-Administered Medications  Medication Dose Route Frequency Provider Last Rate Last Admin   acetaminophen (TYLENOL) tablet 650 mg  650 mg Oral Q6H PRN Lauro Franklin, MD   650 mg at 10/23/21 1135   alum & mag hydroxide-simeth (MAALOX/MYLANTA) 200-200-20 MG/5ML suspension 30 mL  30 mL Oral Q4H PRN Massengill, Harrold Donath, MD       benztropine (COGENTIN) tablet 0.5 mg  0.5 mg Oral BID Karsten Ro, MD   0.5 mg at 10/24/21 0942   feeding supplement (KATE FARMS STANDARD 1.4) liquid 325 mL  325 mL Oral BID BM Massengill, Harrold Donath, MD   325 mL at 10/24/21 0945   fluticasone (FLONASE) 50 MCG/ACT nasal spray 1 spray  1 spray Each Nare Daily Massengill, Harrold Donath, MD   1 spray at 10/24/21 0942   haloperidol (HALDOL) tablet 10 mg  10 mg Oral QHS Bartholomew Crews E, MD   10 mg at 10/23/21 2057   haloperidol (HALDOL) tablet 5 mg  5 mg Oral Daily Comer Locket, MD   5 mg at 10/24/21 0944   hydrocerin (EUCERIN) cream   Topical BID Phineas Inches, MD   1 Application at 10/24/21 0944   hydrOXYzine (ATARAX) tablet 25 mg  25 mg Oral TID PRN Phineas Inches, MD   25 mg at 10/23/21 0802   LORazepam (ATIVAN) tablet 1 mg  1 mg Oral Q6H PRN Bartholomew Crews E, MD   1 mg at 10/07/21 1546   magnesium hydroxide (MILK OF MAGNESIA) suspension 30 mL  30 mL Oral Daily PRN Massengill, Harrold Donath, MD       nicotine polacrilex (NICORETTE) gum 2 mg  2 mg Oral PRN Abbott Pao, Nadir, MD   2 mg at 10/19/21 0726    OLANZapine zydis (ZYPREXA) disintegrating tablet 5 mg  5 mg Oral Q8H PRN Bartholomew Crews E, MD   5 mg at 10/14/21 1416   And   ziprasidone (GEODON) injection 20 mg  20 mg Intramuscular PRN Comer Locket, MD       PTA Medications: Medications Prior to Admission  Medication Sig Dispense Refill Last Dose   CLARITIN 10 MG tablet Take 10 mg by mouth daily.      fluticasone (FLONASE) 50 MCG/ACT nasal spray Place 1 spray into both nostrils daily.      pseudoephedrine-guaifenesin (MUCINEX D) 60-600 MG 12 hr tablet Take 1 tablet by mouth every 12 (twelve) hours. 30 tablet 0     Patient Stressors: Health problems   Medication change or noncompliance    Patient Strengths: Capable of independent living  Printmaker for treatment/growth  Supportive family/friends   Treatment Modalities: Medication Management, Group therapy, Case management,  1 to 1 session with clinician, Psychoeducation, Recreational therapy.   Physician Treatment Plan for Primary Diagnosis: Bipolar affective disorder, current episode manic with psychotic symptoms (HCC) Long Term Goal(s): Improvement in symptoms so as ready for discharge   Short Term Goals: Ability to identify and develop effective coping behaviors will improve Ability to maintain clinical measurements within normal limits will improve Compliance with prescribed  medications will improve Ability to identify changes in lifestyle to reduce recurrence of condition will improve Ability to verbalize feelings will improve Ability to disclose and discuss suicidal ideas Ability to demonstrate self-control will improve  Medication Management: Evaluate patient's response, side effects, and tolerance of medication regimen.  Therapeutic Interventions: 1 to 1 sessions, Unit Group sessions and Medication administration.  Evaluation of Outcomes: Progressing  Physician Treatment Plan for Secondary Diagnosis: Principal Problem:   Bipolar affective  disorder, current episode manic with psychotic symptoms (HCC) Active Problems:   Increased ammonia level   Valproic acid causing adverse effect in therapeutic use   Altered mental status  Long Term Goal(s): Improvement in symptoms so as ready for discharge   Short Term Goals: Ability to identify and develop effective coping behaviors will improve Ability to maintain clinical measurements within normal limits will improve Compliance with prescribed medications will improve Ability to identify changes in lifestyle to reduce recurrence of condition will improve Ability to verbalize feelings will improve Ability to disclose and discuss suicidal ideas Ability to demonstrate self-control will improve     Medication Management: Evaluate patient's response, side effects, and tolerance of medication regimen.  Therapeutic Interventions: 1 to 1 sessions, Unit Group sessions and Medication administration.  Evaluation of Outcomes: Progressing   RN Treatment Plan for Primary Diagnosis: Bipolar affective disorder, current episode manic with psychotic symptoms (HCC) Long Term Goal(s): Knowledge of disease and therapeutic regimen to maintain health will improve  Short Term Goals: Ability to remain free from injury will improve, Ability to participate in decision making will improve, Ability to verbalize feelings will improve, Ability to disclose and discuss suicidal ideas, and Ability to identify and develop effective coping behaviors will improve  Medication Management: RN will administer medications as ordered by provider, will assess and evaluate patient's response and provide education to patient for prescribed medication. RN will report any adverse and/or side effects to prescribing provider.  Therapeutic Interventions: 1 on 1 counseling sessions, Psychoeducation, Medication administration, Evaluate responses to treatment, Monitor vital signs and CBGs as ordered, Perform/monitor CIWA, COWS, AIMS  and Fall Risk screenings as ordered, Perform wound care treatments as ordered.  Evaluation of Outcomes: Progressing   LCSW Treatment Plan for Primary Diagnosis: Bipolar affective disorder, current episode manic with psychotic symptoms (HCC) Long Term Goal(s): Safe transition to appropriate next level of care at discharge, Engage patient in therapeutic group addressing interpersonal concerns.  Short Term Goals: Engage patient in aftercare planning with referrals and resources, Increase social support, Increase emotional regulation, Facilitate acceptance of mental health diagnosis and concerns, Identify triggers associated with mental health/substance abuse issues, and Increase skills for wellness and recovery  Therapeutic Interventions: Assess for all discharge needs, 1 to 1 time with Social worker, Explore available resources and support systems, Assess for adequacy in community support network, Educate family and significant other(s) on suicide prevention, Complete Psychosocial Assessment, Interpersonal group therapy.  Evaluation of Outcomes: Progressing   Progress in Treatment: Attending groups: Yes. Participating in groups: Yes. Taking medication as prescribed: Yes. Toleration medication: Yes. Family/Significant other contact made: Yes, individual(s) contacted:  SPE completed with Ms Dan Humphreys, mother.  Patient understands diagnosis: Yes. Discussing patient identified problems/goals with staff: Yes. Medical problems stabilized or resolved: Yes. Denies suicidal/homicidal ideation: Yes. Issues/concerns per patient self-inventory: Yes. Other: none   New problem(s) identified: Yes, Describe:  Mother has filed for guardianship over patient, has court for 12/24/2021. Mother was encouraged to file for emergent guardianship. CSW has filed APS report to pursue  DSS guardianship.    New Short Term/Long Term Goal(s): Patient to work towards  elimination of symptoms of psychosis, medication management  for mood stabilization;development of comprehensive mental wellness plan.   Patient Goals:  No additional goals identified at this time. Patient to continue to work towards original goals identified in initial treatment team meeting. CSW will remain available to patient should they voice additional treatment goals.    Discharge Plan or Barriers: Patient is homeless, lacks capacity to be self sufficient.    Reason for Continuation of Hospitalization: Mania   Estimated Length of Stay: TBD     Last 3 [place] Suicide Severity Risk Score: Flowsheet Row [facility] (Current[facility]/05/2021 in [facility] ED from 05/26/2020 in [facility]  C-SSRS RISK CATEGORY High Risk High Risk No Risk       Last PHQ 2/9 Scores:    05/26/2020    2:14 PM 09/03/2017    4:34 PM 08/07/2016    2:12 PM  Depression screen PHQ 2/9  Decreased Interest 3 3 3   Down, Depressed, Hopeless 3 1 2   PHQ - 2 Score 6 4 5   Altered sleeping 0 3 3  Tired, decreased energy 0 3 0  Change in appetite 3 3 0  Feeling bad or failure about yourself  0 3 1  Trouble concentrating 3 0 0  Moving slowly or fidgety/restless 3 0 1  Suicidal thoughts 0 0 0  PHQ-9 Score 15 16 10     Scribe for Treatment Team: , 10/24/2021 2:08 PM

## 2021-10-24 NOTE — Progress Notes (Addendum)
[facility] MD Progress Note  10/24/2021 11:19 AM Natalie Brown  MRN:  [id number]  Subjective: Natalie Brown reports: "I feel bad, I missed my breakfast this morning, I can't believe I overslept. I need to leave here, this is an hospital, do you know I have been here since May? My mother is getting a Chief Executive Officer."  HPI: Natalie Brown is a 56 yr old female who presented on 5/17 to [facility] with complaints of anxiety and feeling overwhelmed and SI without a plan, she was admitted to [facility] on 5/19.  PPHx is significant for possible Bipolar Disorder.  Today's patient assessment note: Pt's chart reviewed, her case discussed with the treatment team. Pt has been compliant with her scheduled medications over the past 24 hrs.   [name] reported 5/10 depression on a scale of 1-10, 10 being the worst depression.She continues to perseverate, and is tangential in her responses to questions but she was easily directed back to the questions. She reported that her mother is getting a Chief Executive Officer because she is staying in the [facility] for too long. She was educated on the fact that our Social workers are currently awaiting for her to have benefits so that she can be able to have housing. She seems not to have insight regarding this.She reported missing her breakfast this morning because she she overslept, otherwise her appetite is good and she reported sleeping well. She denies SI, HI, AVH or paranoia. She does not make grandiose statements today but continues to show no insight into her present need for safe housing plans for discharge.AIMS:0. Pt denies being in any physical discomfort. Will continue all medications as listed below.  Principal Problem: Bipolar affective disorder, current episode manic with psychotic symptoms ([place]) Diagnosis: Principal Problem:   Bipolar affective disorder, current episode manic with psychotic symptoms ([place]) Active Problems:   Increased ammonia level   Valproic acid causing adverse effect in therapeutic use    Altered mental status  Total Time spent with patient:  I personally spent 30 minutes on the unit in direct patient care. The direct patient care time included face-to-face time with the patient, reviewing the patient's chart, communicating with other professionals, and coordinating care. Greater than 50% of this time was spent in counseling or coordinating care with the patient regarding goals of hospitalization, psycho-education, and discharge planning needs.   Past Psychiatric History: Probable bipolar disorder  Past Medical History:  Past Medical History:  Diagnosis Date   Bipolar 1 disorder with moderate mania ([place]) 12/30/2013   Hypertension    History reviewed. No pertinent surgical history. Family History: History reviewed. No pertinent family history. Family Psychiatric  History: Patient reports mental illness runs in her father's side of the family Social History:  Social History   Substance and Sexual Activity  Alcohol Use No     Social History   Substance and Sexual Activity  Drug Use No    Social History   Socioeconomic History   Marital status: Married    Spouse name: Not on file   Number of children: Not on file   Years of education: Not on file   Highest education level: Not on file  Occupational History   Not on file  Tobacco Use   Smoking status: Every Day   Smokeless tobacco: Current  Substance and Sexual Activity   Alcohol use: No   Drug use: No   Sexual activity: Not on file  Other Topics Concern   Not on file  Social History Narrative  Not on file   Social Determinants of Health   Financial Resource Strain: Not on file  Food Insecurity: Not on file  Transportation Needs: Not on file  Physical Activity: Not on file  Stress: Not on file  Social Connections: Not on file   Additional Social History:   Sleep: Good  Appetite:  Good  Current Medications: Current Facility-Administered Medications  Medication Dose Route Frequency Provider  Last Rate Last Admin   acetaminophen (TYLENOL) tablet 650 mg  650 mg Oral Q6H PRN Briant Cedar, MD   650 mg at 10/23/21 1135   alum & mag hydroxide-simeth (MAALOX/MYLANTA) 200-200-20 MG/5ML suspension 30 mL  30 mL Oral Q4H PRN Massengill, [name], MD       benztropine (COGENTIN) tablet 0.5 mg  0.5 mg Oral BID Armando Reichert, MD   0.5 mg at 10/24/21 0942   feeding supplement (KATE FARMS STANDARD 1.4) liquid 325 mL  325 mL Oral BID BM Massengill, Nathan, MD   325 mL at 10/24/21 0945   fluticasone (FLONASE) 50 MCG/ACT nasal spray 1 spray  1 spray Each Nare Daily Massengill, [name], MD   1 spray at 10/24/21 0942   haloperidol (HALDOL) tablet 10 mg  10 mg Oral QHS Nelda Marseille, Nadalyn Deringer E, MD   10 mg at 10/23/21 2057   haloperidol (HALDOL) tablet 5 mg  5 mg Oral Daily Harlow Asa, MD   5 mg at 10/24/21 0944   hydrocerin (EUCERIN) cream   Topical BID Janine Limbo, MD   1 Application at 72/53/66 0944   hydrOXYzine (ATARAX) tablet 25 mg  25 mg Oral TID PRN Janine Limbo, MD   25 mg at 10/23/21 0802   LORazepam (ATIVAN) tablet 1 mg  1 mg Oral Q6H PRN Viann Fish E, MD   1 mg at 10/07/21 1546   magnesium hydroxide (MILK OF MAGNESIA) suspension 30 mL  30 mL Oral Daily PRN Massengill, [name], MD       nicotine polacrilex (NICORETTE) gum 2 mg  2 mg Oral PRN Winfred Leeds, Nadir, MD   2 mg at 10/19/21 0726   OLANZapine zydis (ZYPREXA) disintegrating tablet 5 mg  5 mg Oral Q8H PRN Viann Fish E, MD   5 mg at 10/14/21 1416   And   ziprasidone (GEODON) injection 20 mg  20 mg Intramuscular PRN Harlow Asa, MD       Lab Results:  No results found for this or any previous visit (from the past 71 hour(s)).  Blood Alcohol level:  Lab Results  Component Value Date   ETH <10 44/06/4740   Metabolic Disorder Labs: Lab Results  Component Value Date   HGBA1C 5.3 09/08/2021   MPG 105.41 09/08/2021   No results found for: "PROLACTIN" Lab Results  Component Value Date   CHOL 173 09/08/2021    TRIG 110 09/08/2021   HDL 50 09/08/2021   CHOLHDL 3.5 09/08/2021   VLDL 22 09/08/2021   LDLCALC 101 (H) 09/08/2021   LDLCALC 102 (H) 06/21/2020   Physical Findings:  AIMS: Facial and Oral Movements Muscles of Facial Expression: None, normal Lips and Perioral Area: None, normal Jaw: None, normal Tongue: None, normal,Extremity Movements Upper (arms, wrists, hands, fingers): None, normal Lower (legs, knees, ankles, toes): None, normal, Trunk Movements Neck, shoulders, hips: None, normal, Overall Severity Severity of abnormal movements (highest score from questions above): None, normal Incapacitation due to abnormal movements: None, normal Patient's awareness of abnormal movements (rate only patient's report): No Awareness, Dental Status Current problems with  teeth and/or dentures?: No Does patient usually wear dentures?: No  No Cogwheeling or Rigidity Present.  Musculoskeletal: Strength & Muscle Tone: within normal limits Gait & Station: normal Patient leans: N/A  Psychiatric Specialty Exam:  Presentation  General Appearance: Fair grooming - wearing hair bonnet  Eye Contact:Fair  Speech:rambling but normal fluency  Speech Volume:Normal  Mood and Affect  Mood:Described as depressed - appears anxious  Affect:anxious  Thought Process  Thought Processes: perseverative and ruminative  Orientation:Partial  Thought Content: Continues to have baseline, fixed delusions but does not mention these unless prompted with questions; is not grossly responding to internal/external stimuli on exam; denies SI, HI, AVH or paranoia and denies ideas of reference or first rank symptoms but is guarded and perseverative about need for continued admission and need to get to "federal cell phone"  Hallucinations:Hallucinations: None  Ideas of Reference:Denied  Suicidal Thoughts:Suicidal Thoughts: No  Homicidal Thoughts:Homicidal Thoughts: No  Sensorium  Memory:Limited    Judgment:Poor  Insight:Poor  Executive Functions  Concentration:Fair  Attention Span:Fair  Recall:Poor  Fund of Knowledge:Limited  Language:Fair  Psychomotor Activity  Psychomotor Activity:Psychomotor Activity: Normal  Assets  Assets:Resilience  Sleep  7 hours   Physical Exam Vitals and nursing note reviewed.  Constitutional:      General: [name] not in acute distress.    Appearance: Normal appearance. [name] normal weight. [name] not ill-appearing or toxic-appearing.  HENT:     Head: Normocephalic and atraumatic.  Pulmonary:     Effort: Pulmonary effort is normal.  Musculoskeletal:        General: Normal range of motion.  Neurological:     General: No focal deficit present.     Mental Status: [name] alert.    Review of Systems  Respiratory:  Negative for cough and shortness of breath.   Cardiovascular:  Negative for chest pain.  Gastrointestinal:  Negative for abdominal pain, constipation, diarrhea, nausea and vomiting.  Neurological:  Positive for headaches (She took some tylenol. Mild). Negative for dizziness and weakness.  Psychiatric/Behavioral:  Depression: She rated her Depression as 5/10 on a scale of 1-10. 10 being the worst depression..    Blood pressure 114/76, pulse 90, temperature 98.2 F (36.8 C), temperature source Oral, resp. rate 16, height 5' 6"  (1.676 m), weight 72.6 kg, SpO2 100 %. Body mass index is 25.83 kg/m.  Treatment Plan Summary: Daily contact with patient to assess and evaluate symptoms and progress in treatment and Medication management  Safety and Monitoring: Involuntary admission to inpatient psychiatric unit for safety, stabilization and treatment Daily contact with patient to assess and evaluate symptoms and progress in treatment Patient's case to be discussed in multi-disciplinary team meeting Observation Level : q15 minute checks Vital signs: q12 hours Precautions: safety   Bipolar I MRE manic with psychotic  features (r/o schizoaffective d/o - bipolar type)             -Continue Haldol 5 mg AM and 10 mg QHS for psychosis and mood stabilization              -Continue Cogentin 0.5 mg twice daily             -Invega Sustenna 234 mg IM given on 09-30-21 126m IM given 10/12/20 - due for next monthly IM injection on 11/09/21             -- Zyprexa/Geodon/ativan agitation protocol PRN            -Additional psychosis labs (HIV- ,  RPR-, ANA-, heavy metal WNL, ESR wnl, ceruloplasmin wnl and CT head shows Marked for age brain atrophy.  No acute or reversible finding.)   -Depakote, Ativan, and Zyprexa stopped    Hyperammonemia: -Repeat ammonia level was trending down with last draw and patient currently asymptomatic-continuing to deny blood draws for repeat ammonia levels -Depakote was stopped    Mildly elevated creatinine - improved -Creatinine downtrending to 1.35 -> 1.20 -> 1.04 -We will keep on trending as she will cooperate for labs    Season allergies -Continue Claritin 86m daily PRN    Urinary frequency- resolved -repeat UA with rare bacteria, but other wise, WNL  Discharge Planning: Social work and case management to assist with discharge planning and identification of [facility] follow-up needs prior to discharge Estimated LOS: 5-7 days Discharge Concerns: Need to establish a safety plan; Medication compliance and effectiveness Discharge Goals: Return home with outpatient referrals for mental health follow-up including medication management/psychotherapy Labs On Admission- CMP: WNL except Cr: 1.1,  GFR: 60,  CBC: WNL,  UDS: Neg,  Troponin: 3,  EtOH: Neg,  beta hCG: 6.4,  Resp Panel: Neg 5/29- Urine Preg: Neg,  A1c: 5.3,  TSH: 1.719,  Lipid Panel: WNL except LDL: 101 5/24- BMP: WNL except Cr: 1.01,  Ca: 8.4,  hCG: 5,  Urine Preg: Neg 5/28- Ammonia: 32,  CBC: WNL,  CMP: WNL except  Cr: 1.08,  Ca: 8.6,  Depakote lvl: 20 6/3- Depakote lvl: 64, Hep Function: WNL,  CBC: WNL except Hem: 11.9 6/7-  Depakote lvl: 75 6/18- UA: Neg,  CBC: WNL,  Depakote lvl: 136,  Ammonia: 99,  BMP: WNL except BUN: 22,  Cr: 1.19,  Ca: 8.6,  GFR: 54,  Hep Function: WNL except Albu: 3.3,  Indirect Bili: 0.2 6/20- ANA: Neg,  Ammonia: 48,  Ceruloplasmin: 24.2,  CMP: WNL except BUN: 23,  Cr: 1.35,  Ca: 8.3,  Albu: 3.4  HIV:Neg,  RPR: Neg,  Sed Rate: 6,  Depakote lvl: 25,  Heavy Metal: Neg 6/23- Ammonia: 46,  BMP: WNL except Cr: 1.2,  Ca: 8.3,  GFR: 54 6/24- UA: WNL except Bacteria: rare 6/30- CMP:  WNL except Cr: 1.04,  Ca: 8.6,  CBC: WNL,  Ammonia: 41, EKG: NSR with Qtc: 3[place] NP 10/24/2021, 11:19 AMPatient ID: AManuella [facility] female   DOB: 1Mar 17, 1967 56y.o.   MRN: [id number] ID: Natalie [facility] female   DOB: 108-31-1967 56y.o.   MRN: [id number] ID: AKHAILA [facility] female   DOB: 1Oct 04, 1967 56y.o.   MRN: [id number]

## 2021-10-24 NOTE — Progress Notes (Signed)
   10/24/21 0515  Sleep  Number of Hours 7

## 2021-10-24 NOTE — Group Note (Signed)
Type of Therapy and Topic:  Group Therapy:  Stress Management °  °Participation Level:  Did Not Attend  °  °Description of Group:  Patients in this group were introduced to the idea of stress and encouraged to discuss negative and positive ways to manage stress. Patients discussed specific stressors that they have in their life right now and the physical signs and symptoms associated with that stress.  Patient encouraged to come up with positive changes to assist with the stress upon discharge in order to prevent future hospitalizations.   They also worked as a group on developing a specific plan for several patients to deal with stressors through boundary-setting, psychoeducation and self care techniques °  °Therapeutic Goals: °  °            1)  To discuss the positive and negative impacts of stress °            2)  identify signs and symptoms of stress °            3)  generate ideas for stress management °            4)  offer mutual support to others regarding stress management °            5)  Developing plans for ways to manage specific stressors upon discharge °            °  °Summary of Patient Progress:  Did not attend °  °Therapeutic Modalities:   °Motivational Interviewing °Brief Solution-Focused Therapy ° ° °Katana Berthold, LCSW, LCAS °Clincal Social Worker  °[facility] ° ° °

## 2021-10-24 NOTE — Progress Notes (Signed)
   10/24/21 2000  Psych Admission Type (Psych Patients Only)  Admission Status Involuntary  Psychosocial Assessment  Patient Complaints Anxiety;Confusion  Eye Contact Brief  Facial Expression Worried  Affect Preoccupied  Speech Slow  Interaction Assertive  Motor Activity Slow  Appearance/Hygiene Layered clothes  Behavior Characteristics Cooperative  Mood Preoccupied  Aggressive Behavior  Effect No apparent injury  Thought Process  Coherency Disorganized  Content Preoccupation  Delusions None reported or observed  Perception WDL  Hallucination None reported or observed  Judgment Impaired  Confusion None  Danger to Self  Current suicidal ideation? Denies  Danger to Others  Danger to Others None reported or observed

## 2021-10-24 NOTE — Progress Notes (Signed)
The focus of this group is to help patients review their daily goal of treatment and discuss progress on daily workbooks.  Pt attended the evening group and responded to all discussion prompts from the Writer. Pt shared that today was a good day on the unit, the highlight of which was having good conversations with her treatment team. "I feel like they're taking care of me here."  Pt told that, upon discharge, she plans to stay well by getting a job, which she'll use to help support herself. 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

## 2021-10-24 NOTE — Group Note (Signed)
Recreation Therapy Group Note   Group Topic:Problem Solving  Group Date: 10/24/2021 Start Time: 1005 End Time: 1030 Facilitators: Caroll Rancher, LRT,CTRS Location: [address] Dayroom   Goal Area(s) Addresses:  Patient will effectively work with peer towards shared goal.  Patient will identify skills used to make activity successful.  Patient will identify how skills used during activity can be used to reach post d/c goals.    Group Description:  Straw Bridge. In teams of 3-5, patients were given 15 plastic drinking straws and an equal length of masking tape. Using the materials provided, patients were instructed to build a free standing bridge-like structure to suspend an everyday item (ex: puzzle box) off of the floor or table surface. All materials were required to be used by the team in their design. LRT facilitated post-activity discussion reviewing team process. Patients were encouraged to reflect how the skills used in this activity can be generalized to daily life post discharge.   Affect/Mood: N/A   Participation Level: Did not attend    Clinical Observations/Individualized Feedback:      Plan: Continue to engage patient in RT group sessions 2-3x/week.   Caroll Rancher, LRT,CTRS 10/24/2021 12:50 PM

## 2021-10-25 DIAGNOSIS — F312 Bipolar disorder, current episode manic severe with psychotic features: Secondary | ICD-10-CM | POA: Diagnosis not present

## 2021-10-25 NOTE — Progress Notes (Signed)
   10/25/21 2100  Psych Admission Type (Psych Patients Only)  Admission Status Involuntary  Psychosocial Assessment  Patient Complaints Confusion  Eye Contact Brief  Facial Expression Worried  Affect Preoccupied  Speech Slow  Interaction Assertive  Motor Activity Slow  Appearance/Hygiene Layered clothes  Behavior Characteristics Cooperative  Mood Preoccupied  Aggressive Behavior  Effect No apparent injury  Thought Process  Coherency Disorganized  Content Preoccupation  Delusions None reported or observed  Perception WDL  Hallucination None reported or observed  Judgment Impaired  Confusion None  Danger to Self  Current suicidal ideation? Denies  Danger to Others  Danger to Others None reported or observed

## 2021-10-25 NOTE — Progress Notes (Signed)
[facility] MD Progress Note  10/25/2021 4:03 PM Natalie Brown  MRN:  [id number]  Subjective: Natalie Brown reports: "I'm okay, I'm upset because I am still here. I want to be on my own so I can to to [facility]. I know my mom says a week, but I want to do something on my own. I know that [facility] is good. [facility] can help me too."  HPI: Natalie Brown is a 56 yr old female who presented on 5/17 to [facility] with complaints of anxiety and feeling overwhelmed and SI without a plan, she was admitted to [facility] on 5/19.  PPHx is significant for possible Bipolar Disorder.  Today's patient assessment note: Pt's chart reviewed, her case discussed with the treatment team. Pt with flat affect and depressed mood, attention to personal hygiene and grooming is poor, and the need to tend to personal hygiene and grooming encouraged and personal hygiene items supplied. Pt's eye contact is good, speech is clear, but lacking in coherency. She presents with flight of ideas, and continues to perseverate & ruminate about wanting to discharged.  She states that the [facility] is able to assist her, and that she plans to go to [facility]" and [facility]" after discharge.Thoughts are disorganized, with some illogical content, and pt currently denies SI/HI/AVH. She continues to present with paranoia, and continues to state that she is not giving any blood draws because we might "do something" with her blood, and she is not getting paid for it.  Pt reports a good sleep quality last night, reports a good appetite, and denies any medication related side effects. There is no abnormal movements of any of her extremities, and there is no stiffness or any other EPS related symptoms. The disability assistance coordinator was on unit today to assist pt with an application for disability income (please see CSW notes from today). Will continue all of pt's medications as listed below.  Principal Problem: Bipolar affective disorder, current episode  manic with psychotic symptoms ([place]) Diagnosis: Principal Problem:   Bipolar affective disorder, current episode manic with psychotic symptoms ([place]) Active Problems:   Increased ammonia level   Valproic acid causing adverse effect in therapeutic use   Altered mental status  Total Time spent with patient:  I personally spent 30 minutes on the unit in direct patient care. The direct patient care time included face-to-face time with the patient, reviewing the patient's chart, communicating with other professionals, and coordinating care. Greater than 50% of this time was spent in counseling or coordinating care with the patient regarding goals of hospitalization, psycho-education, and discharge planning needs.   Past Psychiatric History: Probable bipolar disorder  Past Medical History:  Past Medical History:  Diagnosis Date   Bipolar 1 disorder with moderate mania ([place]) 12/30/2013   Hypertension    History reviewed. No pertinent surgical history. Family History: History reviewed. No pertinent family history. Family Psychiatric  History: Patient reports mental illness runs in her father's side of the family Social History:  Social History   Substance and Sexual Activity  Alcohol Use No     Social History   Substance and Sexual Activity  Drug Use No    Social History   Socioeconomic History   Marital status: Married    Spouse name: Not on file   Number of children: Not on file   Years of education: Not on file   Highest education level: Not on file  Occupational History   Not on file  Tobacco Use  Smoking status: Every Day   Smokeless tobacco: Current  Substance and Sexual Activity   Alcohol use: No   Drug use: No   Sexual activity: Not on file  Other Topics Concern   Not on file  Social History Narrative   Not on file   Social Determinants of Health   Financial Resource Strain: Not on file  Food Insecurity: Not on file  Transportation Needs: Not on file   Physical Activity: Not on file  Stress: Not on file  Social Connections: Not on file   Additional Social History:   Sleep: Good  Appetite:  Good  Current Medications: Current Facility-Administered Medications  Medication Dose Route Frequency Provider Last Rate Last Admin   acetaminophen (TYLENOL) tablet 650 mg  650 mg Oral Q6H PRN Briant Cedar, MD   650 mg at 10/23/21 1135   alum & mag hydroxide-simeth (MAALOX/MYLANTA) 200-200-20 MG/5ML suspension 30 mL  30 mL Oral Q4H PRN Massengill, [name], MD       benztropine (COGENTIN) tablet 0.5 mg  0.5 mg Oral BID Rosita Kea, Vandana, MD   0.5 mg at 10/25/21 0810   feeding supplement (KATE FARMS STANDARD 1.4) liquid 325 mL  325 mL Oral BID BM Massengill, Nathan, MD   325 mL at 10/24/21 1443   fluticasone (FLONASE) 50 MCG/ACT nasal spray 1 spray  1 spray Each Nare Daily Massengill, [name], MD   1 spray at 10/25/21 0810   haloperidol (HALDOL) tablet 10 mg  10 mg Oral QHS Nelda Marseille, Amy E, MD   10 mg at 10/24/21 2049   haloperidol (HALDOL) tablet 5 mg  5 mg Oral Daily Harlow Asa, MD   5 mg at 10/25/21 7412   hydrocerin (EUCERIN) cream   Topical BID Janine Limbo, MD   Given at 10/25/21 8786   hydrOXYzine (ATARAX) tablet 25 mg  25 mg Oral TID PRN Janine Limbo, MD   25 mg at 10/23/21 0802   LORazepam (ATIVAN) tablet 1 mg  1 mg Oral Q6H PRN Viann Fish E, MD   1 mg at 10/07/21 1546   magnesium hydroxide (MILK OF MAGNESIA) suspension 30 mL  30 mL Oral Daily PRN Massengill, [name], MD       nicotine polacrilex (NICORETTE) gum 2 mg  2 mg Oral PRN Winfred Leeds, Nadir, MD   2 mg at 10/19/21 0726   OLANZapine zydis (ZYPREXA) disintegrating tablet 5 mg  5 mg Oral Q8H PRN Viann Fish E, MD   5 mg at 10/14/21 1416   And   ziprasidone (GEODON) injection 20 mg  20 mg Intramuscular PRN Harlow Asa, MD       Lab Results:  No results found for this or any previous visit (from the past 59 hour(s)).  Blood Alcohol level:  Lab Results   Component Value Date   ETH <10 76/72/0947   Metabolic Disorder Labs: Lab Results  Component Value Date   HGBA1C 5.3 09/08/2021   MPG 105.41 09/08/2021   No results found for: "PROLACTIN" Lab Results  Component Value Date   CHOL 173 09/08/2021   TRIG 110 09/08/2021   HDL 50 09/08/2021   CHOLHDL 3.5 09/08/2021   VLDL 22 09/08/2021   LDLCALC 101 (H) 09/08/2021   LDLCALC 102 (H) 06/21/2020   Physical Findings:  AIMS: Facial and Oral Movements Muscles of Facial Expression: None, normal Lips and Perioral Area: None, normal Jaw: None, normal Tongue: None, normal,Extremity Movements Upper (arms, wrists, hands, fingers): None, normal Lower (legs, knees, ankles, toes):  None, normal, Trunk Movements Neck, shoulders, hips: None, normal, Overall Severity Severity of abnormal movements (highest score from questions above): None, normal Incapacitation due to abnormal movements: None, normal Patient's awareness of abnormal movements (rate only patient's report): No Awareness, Dental Status Current problems with teeth and/or dentures?: No Does patient usually wear dentures?: No  No Cogwheeling or Rigidity Present.  Musculoskeletal: Strength & Muscle Tone: within normal limits Gait & Station: normal Patient leans: N/A  Psychiatric Specialty Exam:  Presentation  General Appearance: Fair grooming - wearing hair bonnet  Eye Contact:Good  Speech:rambling but normal fluency  Speech Volume:Normal  Mood and Affect  Mood:Described as depressed - appears anxious  Affect:anxious  Thought Process  Thought Processes: perseverative and ruminative  Orientation:Full (Time, Place and Person)  Thought Content: Continues to have baseline, fixed delusions but does not mention these unless prompted with questions; is not grossly responding to internal/external stimuli on exam; denies SI, HI, AVH or paranoia and denies ideas of reference or first rank symptoms but is guarded and  perseverative about need for continued admission and need to get to "federal cell phone"  Hallucinations:Hallucinations: None  Ideas of Reference:Denied  Suicidal Thoughts:Suicidal Thoughts: No  Homicidal Thoughts:Homicidal Thoughts: No  Sensorium  Memory:Limited   Judgment:Poor  Insight:Poor  Executive Functions  Concentration:Fair  Attention Span:Fair  [place] of Knowledge:Limited  Language:Fair  Psychomotor Activity  Psychomotor Activity:Psychomotor Activity: Normal  Assets  Assets:Resilience; Communication Skills  Sleep  7 hours   Physical Exam Vitals and nursing note reviewed.  Constitutional:      General: [name] not in acute distress.    Appearance: Normal appearance. [name] normal weight. [name] not ill-appearing or toxic-appearing.  HENT:     Head: Normocephalic and atraumatic.  Pulmonary:     Effort: Pulmonary effort is normal.  Musculoskeletal:        General: Normal range of motion.  Neurological:     General: No focal deficit present.     Mental Status: [name] alert.    Review of Systems  Respiratory:  Negative for cough and shortness of breath.   Cardiovascular:  Negative for chest pain.  Gastrointestinal:  Negative for abdominal pain, constipation, diarrhea, nausea and vomiting.  Neurological:  Positive for headaches (She took some tylenol. Mild). Negative for dizziness and weakness.  Psychiatric/Behavioral:  Positive for memory loss. Negative for depression (She rated her Depression as 5/10 on a scale of 1-10. 10 being the worst depression.), substance abuse and suicidal ideas. Hallucinations: paranoia.The patient is not nervous/anxious and does not have insomnia.    Blood pressure 105/73, pulse (!) 108, temperature 97.6 F (36.4 C), temperature source Oral, resp. rate 16, height 5' 6"  (1.676 m), weight 72.6 kg, SpO2 100 %. Body mass index is 25.83 kg/m.  Treatment Plan Summary: Daily contact with patient to assess and  evaluate symptoms and progress in treatment and Medication management  Safety and Monitoring: Involuntary admission to inpatient psychiatric unit for safety, stabilization and treatment Daily contact with patient to assess and evaluate symptoms and progress in treatment Patient's case to be discussed in multi-disciplinary team meeting Observation Level : q15 minute checks Vital signs: q12 hours Precautions: safety   Bipolar I MRE manic with psychotic features (r/o schizoaffective d/o - bipolar type)             -Continue Haldol 5 mg AM and 10 mg QHS for psychosis and mood stabilization              -  Continue Cogentin 0.5 mg twice daily             -Invega Sustenna 234 mg IM given on 09-30-21 128m IM given 10/12/20 - due for next monthly IM injection on 11/09/21             -- Zyprexa/Geodon/ativan agitation protocol PRN            -Additional psychosis labs (HIV- , RPR-, ANA-, heavy metal WNL, ESR wnl, ceruloplasmin wnl and CT head shows Marked for age brain atrophy.  No acute or reversible finding.)   -Depakote, Ativan, and Zyprexa stopped    Hyperammonemia: -Repeat ammonia level was trending down with last draw and patient currently asymptomatic-continuing to deny blood draws for repeat ammonia levels -Depakote was stopped    Mildly elevated creatinine - improved -Creatinine downtrending to 1.35 -> 1.20 -> 1.04 -We will keep on trending as she will cooperate for labs    Season allergies -Continue Claritin 142mdaily PRN    Urinary frequency- resolved -repeat UA with rare bacteria, but other wise, WNL  Discharge Planning: Social work and case management to assist with discharge planning and identification of hospital follow-up needs prior to discharge Estimated LOS: 5-7 days Discharge Concerns: Need to establish a safety plan; Medication compliance and effectiveness Discharge Goals: Return home with outpatient referrals for mental health follow-up including medication  management/psychotherapy Labs On Admission- CMP: WNL except Cr: 1.1,  GFR: 60,  CBC: WNL,  UDS: Neg,  Troponin: 3,  EtOH: Neg,  beta hCG: 6.4,  Resp Panel: Neg 5/29- Urine Preg: Neg,  A1c: 5.3,  TSH: 1.719,  Lipid Panel: WNL except LDL: 101 5/24- BMP: WNL except Cr: 1.01,  Ca: 8.4,  hCG: 5,  Urine Preg: Neg 5/28- Ammonia: 32,  CBC: WNL,  CMP: WNL except  Cr: 1.08,  Ca: 8.6,  Depakote lvl: 20 6/3- Depakote lvl: 64, Hep Function: WNL,  CBC: WNL except Hem: 11.9 6/7- Depakote lvl: 75 6/18- UA: Neg,  CBC: WNL,  Depakote lvl: 136,  Ammonia: 99,  BMP: WNL except BUN: 22,  Cr: 1.19,  Ca: 8.6,  GFR: 54,  Hep Function: WNL except Albu: 3.3,  Indirect Bili: 0.2 6/20- ANA: Neg,  Ammonia: 48,  Ceruloplasmin: 24.2,  CMP: WNL except BUN: 23,  Cr: 1.35,  Ca: 8.3,  Albu: 3.4  HIV:Neg,  RPR: Neg,  Sed Rate: 6,  Depakote lvl: 25,  Heavy Metal: Neg 6/23- Ammonia: 46,  BMP: WNL except Cr: 1.2,  Ca: 8.3,  GFR: 54 6/24- UA: WNL except Bacteria: rare 6/30- CMP:  WNL except Cr: 1.04,  Ca: 8.6,  CBC: WNL,  Ammonia: 41, EKG: NSR with Qtc: 39Spruce PineNP 10/25/2021, 4:03 PMPatient ID: AnManuella Ghazifemale   DOB: [id number].   MRN: [id number]

## 2021-10-25 NOTE — Progress Notes (Addendum)
[facility] LCSW Progress Note   10/25/2021    1:00 PM  Natalie Brown   [id number]   Type of Contact and Topic:  Care Coordination   CSW moderated in-person meeting between Natalie Brown, patient, and Onnie Boer ([phone]) disability assistance coordinator at the [facility]. Patient's mother [name] was present by phone to corroborate patient account of work/medical history.   Patient has signed all necessary paperwork required to submit application for both SSDI and SSI simultaneously. [name] states he intends on submitting the application later this week. Further states he believes the application will be expedited by social security allowing for sooner response than the usual 3-6 month timeline, though he is unable to confirm this.  Social Security will be in Psychologist, occupational, mother, and/or [name] regarding medical screening and and additional documentation needed to make decision.   During interview, patient showed marked difficulty answer questions, recalling from memory, and staying on topic. Patient would often begin to answer question and quickly become tangential with loose associations. Patient unable to follow basic instructions.   Situation ongoing, CSW will continue to monitor and update note as more information becomes available. CSW will reach out to Onnie Boer to confirm application is submitted.     Signed:  [name], MSW, LCSWA, LCAS 10/25/2021 1:00 PM

## 2021-10-25 NOTE — Group Note (Signed)
Recreation Therapy Group Note   Group Topic:Communication  Group Date: 10/25/2021 Start Time: 1000 End Time: 1040 Facilitators: Caroll Rancher, LRT,CTRS Location: [address] Day[address]   Goal Area(s) Addresses:  Patient will effectively listen to complete activity.  Patient will identify communication skills used to make activity successful.  Patient will identify how skills used during activity can be used to reach post d/c goals.    Group Description:  Geometric Drawings.  Three volunteers from the peer group will be shown an abstract picture with a particular arrangement of geometrical shapes.  Each round, one 'speaker' will describe the pattern, as accurately as possible without revealing the image to the group.  The remaining group members will listen and draw the picture to reflect how it is described to them. Patients with the role of 'listener' cannot ask clarifying questions but, may request that the speaker repeat a direction. Once the drawings are complete, the presenter will show the rest of the group the picture and compare how close each person came to drawing the picture. LRT will facilitate a post-activity discussion regarding effective communication and the importance of planning, listening, and asking for clarification in daily interactions with others.   Affect/Mood: N/A   Participation Level: Did not attend    Clinical Observations/Individualized Feedback:     Plan: Continue to engage patient in RT group sessions 2-3x/week.   Caroll Rancher, LRT,CTRS  10/25/2021 12:43 PM

## 2021-10-25 NOTE — Progress Notes (Signed)
   10/25/21 1200  Psych Admission Type (Psych Patients Only)  Admission Status Involuntary  Psychosocial Assessment  Patient Complaints Confusion  Eye Contact Brief  Facial Expression Worried  Affect Preoccupied  Speech Soft;Slow  Interaction Assertive  Motor Activity Slow  Appearance/Hygiene Layered clothes  Behavior Characteristics Cooperative  Mood Preoccupied  Thought Process  Coherency Disorganized  Content Preoccupation  Delusions None reported or observed  Perception WDL  Hallucination None reported or observed  Judgment Impaired  Confusion Mild  Danger to Self  Current suicidal ideation? Denies  Danger to Others  Danger to Others None reported or observed

## 2021-10-25 NOTE — Progress Notes (Signed)
   10/25/21 0515  Sleep  Number of Hours 8

## 2021-10-26 DIAGNOSIS — F312 Bipolar disorder, current episode manic severe with psychotic features: Secondary | ICD-10-CM | POA: Diagnosis not present

## 2021-10-26 NOTE — Progress Notes (Signed)
[facility] MD Progress Note  10/26/2021 12:47 PM FRANCA STAKES  MRN:  [id number]  Subjective: Ritaj reports: "I thought I would be gone before July came in, but they're talking about SSI and medicaid. I want to go to a group home or something like that."  HPI: Xylia Scherger is a 56 yr old female who presented on 5/17 to [facility] with complaints of anxiety and feeling overwhelmed and SI without a plan, she was admitted to [facility] on 5/19.  PPHx is significant for possible Bipolar Disorder.  Today's patient assessment note: Pt's chart reviewed, her case discussed with the treatment team. Pt presents today with a euthymic mood, affect is congruent. Attention to personal hygiene and grooming is fair, and the need to tend to personal hygiene and grooming encouraged and personal hygiene items supplied. Pt's eye contact is good, speech is clear, but continues to be lacking in coherency. She continues to present with flight of ideas, perseveration & rumination about wanting to discharged. She reports today that she thought she would be discharged prior to July. When asked where she thinks she wants to be discharged to, she states that she would like to go to a group home, but that talks are ongoing with other staff members and her mother about SSI and she does not understand.  Pt educated on the fact that she would need the money from [place] or disability in order to live in a group home because group homes are not free. She verbalizes understanding, but does not seem to comprehend it. Thoughts remain disorganized, with some illogical content. Pt currently denies SI/HI/AVH. She continues to present with paranoia & delusional thinking, as she continues to refuse blood draws stating that she is not getting paid for it and is unsure what we are using it for. She has poor insight and does not comprehend explanations and rationales for blood draws  Pt reports a good sleep quality last night, reports a good appetite, and denies any  medication related side effects. There continues to be no abnormal movements of any of her extremities, and there is no stiffness or any other EPS related symptoms. Will continue medications as listed below.  Principal Problem: Bipolar affective disorder, current episode manic with psychotic symptoms ([place]) Diagnosis: Principal Problem:   Bipolar affective disorder, current episode manic with psychotic symptoms ([place]) Active Problems:   Increased ammonia level   Valproic acid causing adverse effect in therapeutic use   Altered mental status  Total Time spent with patient:  I personally spent 30 minutes on the unit in direct patient care. The direct patient care time included face-to-face time with the patient, reviewing the patient's chart, communicating with other professionals, and coordinating care. Greater than 50% of this time was spent in counseling or coordinating care with the patient regarding goals of hospitalization, psycho-education, and discharge planning needs.  Past Psychiatric History: Probable bipolar disorder  Past Medical History:  Past Medical History:  Diagnosis Date   Bipolar 1 disorder with moderate mania ([place]) 12/30/2013   Hypertension    History reviewed. No pertinent surgical history. Family History: History reviewed. No pertinent family history. Family Psychiatric  History: Patient reports mental illness runs in her father's side of the family Social History:  Social History   Substance and Sexual Activity  Alcohol Use No     Social History   Substance and Sexual Activity  Drug Use No    Social History   Socioeconomic History   Marital status: Married  Spouse name: Not on file   Number of children: Not on file   Years of education: Not on file   Highest education level: Not on file  Occupational History   Not on file  Tobacco Use   Smoking status: Every Day   Smokeless tobacco: Current  Substance and Sexual Activity   Alcohol use: No   Drug  use: No   Sexual activity: Not on file  Other Topics Concern   Not on file  Social History Narrative   Not on file   Social Determinants of Health   Financial Resource Strain: Not on file  Food Insecurity: Not on file  Transportation Needs: Not on file  Physical Activity: Not on file  Stress: Not on file  Social Connections: Not on file   Additional Social History:   Sleep: Good  Appetite:  Good  Current Medications: Current Facility-Administered Medications  Medication Dose Route Frequency Provider Last Rate Last Admin   acetaminophen (TYLENOL) tablet 650 mg  650 mg Oral Q6H PRN Briant Cedar, MD   650 mg at 10/26/21 0832   alum & mag hydroxide-simeth (MAALOX/MYLANTA) 200-200-20 MG/5ML suspension 30 mL  30 mL Oral Q4H PRN Massengill, [name], MD       benztropine (COGENTIN) tablet 0.5 mg  0.5 mg Oral BID Rosita Kea, Vandana, MD   0.5 mg at 10/26/21 0825   feeding supplement (KATE FARMS STANDARD 1.4) liquid 325 mL  325 mL Oral BID BM Massengill, Nathan, MD   325 mL at 10/26/21 1046   fluticasone (FLONASE) 50 MCG/ACT nasal spray 1 spray  1 spray Each Nare Daily Massengill, [name], MD   1 spray at 10/26/21 0825   haloperidol (HALDOL) tablet 10 mg  10 mg Oral QHS Nelda Marseille, Amy E, MD   10 mg at 10/25/21 2027   haloperidol (HALDOL) tablet 5 mg  5 mg Oral Daily Viann Fish E, MD   5 mg at 10/26/21 0825   hydrocerin (EUCERIN) cream   Topical BID Janine Limbo, MD   Given at 10/26/21 6063   hydrOXYzine (ATARAX) tablet 25 mg  25 mg Oral TID PRN Janine Limbo, MD   25 mg at 10/23/21 0802   LORazepam (ATIVAN) tablet 1 mg  1 mg Oral Q6H PRN Viann Fish E, MD   1 mg at 10/07/21 1546   magnesium hydroxide (MILK OF MAGNESIA) suspension 30 mL  30 mL Oral Daily PRN Massengill, [name], MD       nicotine polacrilex (NICORETTE) gum 2 mg  2 mg Oral PRN Winfred Leeds, Nadir, MD   2 mg at 10/25/21 2029   OLANZapine zydis (ZYPREXA) disintegrating tablet 5 mg  5 mg Oral Q8H PRN Viann Fish  E, MD   5 mg at 10/14/21 1416   And   ziprasidone (GEODON) injection 20 mg  20 mg Intramuscular PRN Harlow Asa, MD       Lab Results:  No results found for this or any previous visit (from the past 34 hour(s)).  Blood Alcohol level:  Lab Results  Component Value Date   ETH <10 01/60/1093   Metabolic Disorder Labs: Lab Results  Component Value Date   HGBA1C 5.3 09/08/2021   MPG 105.41 09/08/2021   No results found for: "PROLACTIN" Lab Results  Component Value Date   CHOL 173 09/08/2021   TRIG 110 09/08/2021   HDL 50 09/08/2021   CHOLHDL 3.5 09/08/2021   VLDL 22 09/08/2021   LDLCALC 101 (H) 09/08/2021   LDLCALC 102 (H)  06/21/2020   Physical Findings:  AIMS: Facial and Oral Movements Muscles of Facial Expression: None, normal Lips and Perioral Area: None, normal Jaw: None, normal Tongue: None, normal,Extremity Movements Upper (arms, wrists, hands, fingers): None, normal Lower (legs, knees, ankles, toes): None, normal, Trunk Movements Neck, shoulders, hips: None, normal, Overall Severity Severity of abnormal movements (highest score from questions above): None, normal Incapacitation due to abnormal movements: None, normal Patient's awareness of abnormal movements (rate only patient's report): No Awareness, Dental Status Current problems with teeth and/or dentures?: No Does patient usually wear dentures?: No  No Cogwheeling or Rigidity Present.  Musculoskeletal: Strength & Muscle Tone: within normal limits Gait & Station: normal Patient leans: N/A  Psychiatric Specialty Exam:  Presentation  General Appearance: Fair grooming - wearing hair bonnet  Eye Contact:Good  Speech:rambling but normal fluency  Speech Volume:Normal  Mood and Affect  Mood:Described as depressed - appears anxious  Affect:anxious  Thought Process  Thought Processes: perseverative and ruminative  Orientation:Partial  Thought Content: Continues to have baseline, fixed delusions  but does not mention these unless prompted with questions; is not grossly responding to internal/external stimuli on exam; denies SI, HI, AVH or paranoia and denies ideas of reference or first rank symptoms but is guarded and perseverative about need for continued admission and need to get to "federal cell phone"  Hallucinations:Hallucinations: None  Ideas of Reference:Denied  Suicidal Thoughts:Suicidal Thoughts: No  Homicidal Thoughts:Homicidal Thoughts: No  Sensorium  Memory:Limited   Judgment:Poor  Insight:Poor  Executive Functions  Concentration:Fair  Attention Span:Fair  Recall:Poor  Fund of Knowledge:Limited  Language:Fair  Psychomotor Activity  Psychomotor Activity:Psychomotor Activity: Normal  Assets  Assets:Resilience  Sleep  7 hours  Physical Exam Vitals and nursing note reviewed.  Constitutional:      General: [name] not in acute distress.    Appearance: Normal appearance. [name] normal weight. [name] not ill-appearing or toxic-appearing.  HENT:     Head: Normocephalic and atraumatic.  Pulmonary:     Effort: Pulmonary effort is normal.  Musculoskeletal:        General: Normal range of motion.  Neurological:     General: No focal deficit present.     Mental Status: [name] alert.     Sensory: No sensory deficit.     Coordination: Coordination normal.  Psychiatric:        Behavior: Behavior normal.    Review of Systems  Respiratory:  Negative for cough and shortness of breath.   Cardiovascular:  Negative for chest pain.  Gastrointestinal:  Negative for abdominal pain, constipation, diarrhea, nausea and vomiting.  Neurological:  Positive for headaches (She took some tylenol. Mild). Negative for dizziness and weakness.  Psychiatric/Behavioral:  Positive for memory loss. Negative for depression (She rated her Depression as 5/10 on a scale of 1-10. 10 being the worst depression.), substance abuse and suicidal ideas. Hallucinations: paranoia.The  patient is not nervous/anxious and does not have insomnia.    Blood pressure 115/83, pulse (!) 105, temperature 97.6 F (36.4 C), resp. rate 17, height 5' 6" (1.676 m), weight 72.6 kg, SpO2 100 %. Body mass index is 25.83 kg/m.  Treatment Plan Summary: Daily contact with patient to assess and evaluate symptoms and progress in treatment and Medication management  Safety and Monitoring: Involuntary admission to inpatient psychiatric unit for safety, stabilization and treatment Daily contact with patient to assess and evaluate symptoms and progress in treatment Patient's case to be discussed in multi-disciplinary team meeting Observation Level : q15 minute  checks Vital signs: q12 hours Precautions: safety   Bipolar I MRE manic with psychotic features (r/o schizoaffective d/o - bipolar type)             -Continue Haldol 5 mg AM and 10 mg QHS for psychosis and mood stabilization              -Continue Cogentin 0.5 mg twice daily             -Invega Sustenna 234 mg IM given on 09-30-21 173m IM given 10/12/20 - due for next monthly IM injection on 11/09/21             -- Zyprexa/Geodon/ativan agitation protocol PRN            -Additional psychosis labs (HIV- , RPR-, ANA-, heavy metal WNL, ESR wnl, ceruloplasmin wnl and CT head shows Marked for age brain atrophy.  No acute or reversible finding.)   -Depakote, Ativan, and Zyprexa stopped    Hyperammonemia: -Repeat ammonia level was trending down with last draw and patient currently asymptomatic-continuing to deny blood draws for repeat ammonia levels -Depakote was stopped    Mildly elevated creatinine - improved -Creatinine downtrending to 1.35 -> 1.20 -> 1.04 -We will keep on trending as she will cooperate for labs    Season allergies -Continue Claritin 128mdaily PRN    Urinary frequency- resolved -repeat UA with rare bacteria, but other wise, WNL  Discharge Planning: Social work and case management to assist with discharge planning  and identification of hospital follow-up needs prior to discharge Estimated LOS: 5-7 days Discharge Concerns: Need to establish a safety plan; Medication compliance and effectiveness Discharge Goals: Return home with outpatient referrals for mental health follow-up including medication management/psychotherapy Labs On Admission- CMP: WNL except Cr: 1.1,  GFR: 60,  CBC: WNL,  UDS: Neg,  Troponin: 3,  EtOH: Neg,  beta hCG: 6.4,  Resp Panel: Neg 5/29- Urine Preg: Neg,  A1c: 5.3,  TSH: 1.719,  Lipid Panel: WNL except LDL: 101 5/24- BMP: WNL except Cr: 1.01,  Ca: 8.4,  hCG: 5,  Urine Preg: Neg 5/28- Ammonia: 32,  CBC: WNL,  CMP: WNL except  Cr: 1.08,  Ca: 8.6,  Depakote lvl: 20 6/3- Depakote lvl: 64, Hep Function: WNL,  CBC: WNL except Hem: 11.9 6/7- Depakote lvl: 75 6/18- UA: Neg,  CBC: WNL,  Depakote lvl: 136,  Ammonia: 99,  BMP: WNL except BUN: 22,  Cr: 1.19,  Ca: 8.6,  GFR: 54,  Hep Function: WNL except Albu: 3.3,  Indirect Bili: 0.2 6/20- ANA: Neg,  Ammonia: 48,  Ceruloplasmin: 24.2,  CMP: WNL except BUN: 23,  Cr: 1.35,  Ca: 8.3,  Albu: 3.4  HIV:Neg,  RPR: Neg,  Sed Rate: 6,  Depakote lvl: 25,  Heavy Metal: Neg 6/23- Ammonia: 46,  BMP: WNL except Cr: 1.2,  Ca: 8.3,  GFR: 54 6/24- UA: WNL except Bacteria: rare 6/30- CMP:  WNL except Cr: 1.04,  Ca: 8.6,  CBC: WNL,  Ammonia: 41, EKG: NSR with Qtc: 39[place]NP 10/26/2021, 12:47 PMPatient ID: AnManuella Ghazifemale   DOB: 04/[id number].o.   MRN: [id number] ID: AnCHARLY HUNTONfemale   DOB: [id number].o.   MRN: [id number]

## 2021-10-26 NOTE — Group Note (Signed)
[facility] LCSW Group Therapy Note   Group Date: 10/26/2021 Start Time: 1300 End Time: 1400   Type of Therapy/Topic:  Group Therapy:  Emotion Regulation  Participation Level:  Active     Description of Group:    The purpose of this group is to assist patients in learning to regulate negative emotions and experience positive emotions. Patients will be guided to discuss ways in which they have been vulnerable to their negative emotions. These vulnerabilities will be juxtaposed with experiences of positive emotions or situations, and patients challenged to use positive emotions to combat negative ones. Special emphasis will be placed on coping with negative emotions in conflict situations, and patients will process healthy conflict resolution skills.  Therapeutic Goals: Patient will identify two positive emotions or experiences to reflect on in order to balance out negative emotions:  Patient will label two or more emotions that they find the most difficult to experience:  Patient will be able to demonstrate positive conflict resolution skills through discussion or role plays:   Summary of Patient Progress:   Natalie Brown was active in group and discussed feeling nervous when doctors were checking her cognitive ability. Patient discussed having faith in the process has been how she has been coping with her emotions.  Patient was off topic at times and had to be redirected to get back on topic.  Patient easily redirected.     Therapeutic Modalities:   Cognitive Behavioral Therapy Feelings Identification Dialectical Behavioral Therapy   Ayron Fillinger E Hillary Schwegler, LCSW

## 2021-10-26 NOTE — Progress Notes (Signed)
   10/26/21 2045  Psych Admission Type (Psych Patients Only)  Admission Status Involuntary  Psychosocial Assessment  Patient Complaints Confusion  Eye Contact Brief  Facial Expression Worried  Affect Preoccupied  Speech Slow  Interaction Assertive  Motor Activity Slow  Appearance/Hygiene Layered clothes  Behavior Characteristics Cooperative  Mood Preoccupied;Suspicious  Aggressive Behavior  Effect No apparent injury  Thought Process  Coherency Disorganized  Content Preoccupation  Delusions None reported or observed  Perception WDL  Hallucination None reported or observed  Judgment Impaired  Confusion None  Danger to Self  Current suicidal ideation? Denies  Danger to Others  Danger to Others None reported or observed

## 2021-10-26 NOTE — Progress Notes (Signed)
SPIRITUALITY GROUP NOTE  Spirituality group facilitated by Wilkie Aye, MDiv, BCC.  Group Description:  Group focused on topic of hope.  Patients participated in facilitated discussion around topic, connecting with one another around experiences and definitions for hope.  Group members engaged with visual explorer photos, reflecting on what hope looks like for them today.  Group engaged in discussion around how their definitions of hope are present today in hospital.   Modalities: Psycho-social ed, Adlerian, Narrative, MI Patient Progress: Natalie Brown was present throughout group.  Identified her faith as a source of hope.  Mood appeared appropriate for setting.  When responding to ways she maintains hope, she was initially on topic and increasingly tangential.

## 2021-10-26 NOTE — Progress Notes (Signed)
   10/26/21 1100  Psych Admission Type (Psych Patients Only)  Admission Status Involuntary  Psychosocial Assessment  Patient Complaints Confusion  Eye Contact Brief  Facial Expression Worried  Affect Preoccupied  Speech Soft;Slow  Interaction Assertive  Motor Activity Slow  Appearance/Hygiene Layered clothes  Behavior Characteristics Cooperative  Mood Preoccupied  Aggressive Behavior  Effect No apparent injury  Thought Process  Coherency Disorganized  Content Preoccupation  Delusions None reported or observed  Perception WDL  Hallucination None reported or observed  Judgment Impaired  Confusion None  Danger to Self  Current suicidal ideation? Denies  Danger to Others  Danger to Others None reported or observed

## 2021-10-26 NOTE — Progress Notes (Signed)
   10/26/21 0500  Sleep  Number of Hours 7.75

## 2021-10-26 NOTE — Group Note (Signed)
Recreation Therapy Group Note   Group Topic:Team Building  Group Date: 10/26/2021 Start Time: 1002 End Time: 1033 Facilitators: Caroll Rancher, LRT,CTRS Location: [address] Dayroom   Goal Area(s) Addresses:  Patient will effectively work with peer towards shared goal.  Patient will identify skill used to make activity successful.  Patient will identify how skills used during activity can be used to reach post d/c goals.   Group Description:  Patient(s) were given a set of solo cups, a rubber band, and some tied strings. The objective is to build a pyramid with the cups by only using the rubber band and string to move the cups. After the activity the patient(s) and LRT debriefed and discussed what strategies worked, what didn't, and what lessons they can take from the activity and use in life post discharge.    Affect/Mood: Flat   Participation Level: None   Participation Quality: None   Behavior: Distracted   Speech/Thought Process: Distracted   Insight: N/A   Judgement: N/A   Modes of Intervention: Team-building   Patient Response to Interventions:  Disengaged   Education Outcome:  Acknowledges education and In group clarification offered    Clinical Observations/Individualized Feedback:  Pt came in late to group.  Pt did not participate.  Pt was in and out focused on lunch and when she would go home.     Plan: Continue to engage patient in RT group sessions 2-3x/week.   Caroll Rancher, Antonietta Jewel 10/26/2021 12:47 PM

## 2021-10-26 NOTE — Plan of Care (Cosign Needed)
Performed a MOCA and patient scored 11/30. MOCA was scanned into chart. Reviewed results with attending.

## 2021-10-27 DIAGNOSIS — F312 Bipolar disorder, current episode manic severe with psychotic features: Secondary | ICD-10-CM | POA: Diagnosis not present

## 2021-10-27 LAB — COMPREHENSIVE METABOLIC PANEL
ALT: 13 U/L (ref 0–44)
AST: 19 U/L (ref 15–41)
Albumin: 3.6 g/dL (ref 3.5–5.0)
Alkaline Phosphatase: 56 U/L (ref 38–126)
Anion gap: 9 (ref 5–15)
BUN: 19 mg/dL (ref 6–20)
CO2: 24 mmol/L (ref 22–32)
Calcium: 8.9 mg/dL (ref 8.9–10.3)
Chloride: 107 mmol/L (ref 98–111)
Creatinine, Ser: 1.33 mg/dL — ABNORMAL HIGH (ref 0.44–1.00)
GFR, Estimated: 48 mL/min — ABNORMAL LOW (ref >60)
Glucose, Bld: 88 mg/dL (ref 70–99)
Potassium: 4 mmol/L (ref 3.5–5.1)
Sodium: 140 mmol/L (ref 135–145)
Total Bilirubin: 0.6 mg/dL (ref 0.3–1.2)
Total Protein: 7.4 g/dL (ref 6.5–8.1)

## 2021-10-27 LAB — AMMONIA: Ammonia: 30 umol/L (ref 9–35)

## 2021-10-27 NOTE — Progress Notes (Signed)
Patient ID: Natalie Brown, female   DOB: Nov 27, 1965, 56 y.o.   MRN: [id number] D: Patient observed in the day room with a visitor. When asked if she had a good visit, she states, "Yes, I did. That is my boyfriend of 16 years." Patient continues to be confused about her medications and where she is going from [facility] [name] easily redirected when necessary. She did attend group this evening and she took all her medications without incident.  A: Continue to monitor medication management and MD orders.  Safety checks completed every 15 minutes per protocol.  Offer support and encouragement as needed.  R: Patient is receptive to staff; her behavior is appropriate.

## 2021-10-27 NOTE — Progress Notes (Addendum)
[facility] MD Progress Note  10/27/2021 1:57 PM Natalie Brown  MRN:  [id number]  Reason For Admission: Natalie Brown is a 56 yr old female who presented on 5/17 to [facility] with complaints of anxiety and feeling overwhelmed and SI without a plan, she was admitted to [facility] on 5/19.  PPHx is significant for possible Bipolar Disorder.  24 hr chart review: As per review of pt's chart, she has been medication compliant, and has not required a PRN medication the past 24 hrs. Her HR earlier today morning was 118, and nursing staff has been asked to recheck it. As per flow sheets, she slept a total 8.5 hrs last night. As per nursing documentation, over the past 24 hrs, she was preoccupied, suspicious, disorganized & worried.  Today's patient assessment note: Pt continues to present with a euthymic mood, affect is congruent.  Attention to personal hygiene and grooming is poor even though pt reported to writer that she took a shower earlier today morning. Nursing staff will have pt change into a gown so that current clothing can be washed. Hair will also be checked since she wears a bonnet and hair is not visible.  Eye contact is good, speech is clear & coherent. Thought contents are disorganized, and she is circumstantial in response to questions. Thought contents are illogical. She continues to have poor insight and continues not to understand the reason for continuous hospitalization even though it has been explained to her several times. Pt currently denies SI/HI/AVH. Pt is continuing to refuse blood draws for a recheck of her CR and ammonia levels. She continues not to understand the rationale for blood draws even though she has been educated on this several times. Will reorder these labs again, and positive reinforcements will continue to be given to pt to comply with blood draws.  Pt continues to report a good sleep quality last night, reports a good appetite, and denies any medication related side effects. There  continues to be no abnormal movements of any of her extremities, and there is no stiffness or any other EPS related symptoms. AIMS: 0. Will continue medications as listed below.  Principal Problem: Bipolar affective disorder, current episode manic with psychotic symptoms (Natalie Brown) Diagnosis: Principal Problem:   Bipolar affective disorder, current episode manic with psychotic symptoms (Natalie Brown) Active Problems:   Increased ammonia level   Valproic acid causing adverse effect in therapeutic use   Altered mental status  Total Time spent with patient:  I personally spent 30 minutes on the unit in direct patient care. The direct patient care time included face-to-face time with the patient, reviewing the patient's chart, communicating with other professionals, and coordinating care. Greater than 50% of this time was spent in counseling or coordinating care with the patient regarding goals of hospitalization, psycho-education, and discharge planning needs.  Past Psychiatric History: Probable bipolar disorder  Past Medical History:  Past Medical History:  Diagnosis Date   Bipolar 1 disorder with moderate mania ([place]) 12/30/2013   Hypertension    History reviewed. No pertinent surgical history. Family History: History reviewed. No pertinent family history. Family Psychiatric  History: Patient reports mental illness runs in her father's side of the family Social History:  Social History   Substance and Sexual Activity  Alcohol Use No     Social History   Substance and Sexual Activity  Drug Use No    Social History   Socioeconomic History   Marital status: Married    Spouse name: Not on file  Number of children: Not on file   Years of education: Not on file   Highest education level: Not on file  Occupational History   Not on file  Tobacco Use   Smoking status: Every Day   Smokeless tobacco: Current  Substance and Sexual Activity   Alcohol use: No   Drug use: No   Sexual activity:  Not on file  Other Topics Concern   Not on file  Social History Narrative   Not on file   Social Determinants of Health   Financial Resource Strain: Not on file  Food Insecurity: Not on file  Transportation Needs: Not on file  Physical Activity: Not on file  Stress: Not on file  Social Connections: Not on file   Additional Social History:   Sleep: Good  Appetite:  Good  Current Medications: Current Facility-Administered Medications  Medication Dose Route Frequency Provider Last Rate Last Admin   acetaminophen (TYLENOL) tablet 650 mg  650 mg Oral Q6H PRN Briant Cedar, MD   650 mg at 10/26/21 0832   alum & mag hydroxide-simeth (MAALOX/MYLANTA) 200-200-20 MG/5ML suspension 30 mL  30 mL Oral Q4H PRN Massengill, [name], MD       benztropine (COGENTIN) tablet 0.5 mg  0.5 mg Oral BID Armando Reichert, MD   0.5 mg at 10/27/21 0813   feeding supplement (KATE FARMS STANDARD 1.4) liquid 325 mL  325 mL Oral BID BM Massengill, Nathan, MD   325 mL at 10/27/21 1000   fluticasone (FLONASE) 50 MCG/ACT nasal spray 1 spray  1 spray Each Nare Daily Massengill, [name], MD   1 spray at 10/27/21 0813   haloperidol (HALDOL) tablet 10 mg  10 mg Oral QHS Nelda Marseille, Makinzie Considine E, MD   10 mg at 10/26/21 2039   haloperidol (HALDOL) tablet 5 mg  5 mg Oral Daily Viann Fish E, MD   5 mg at 10/27/21 0815   hydrocerin (EUCERIN) cream   Topical BID Janine Limbo, MD   Given at 10/27/21 1275   hydrOXYzine (ATARAX) tablet 25 mg  25 mg Oral TID PRN Janine Limbo, MD   25 mg at 10/23/21 0802   LORazepam (ATIVAN) tablet 1 mg  1 mg Oral Q6H PRN Viann Fish E, MD   1 mg at 10/07/21 1546   magnesium hydroxide (MILK OF MAGNESIA) suspension 30 mL  30 mL Oral Daily PRN Massengill, [name], MD       nicotine polacrilex (NICORETTE) gum 2 mg  2 mg Oral PRN Winfred Leeds, Nadir, MD   2 mg at 10/26/21 2041   OLANZapine zydis (ZYPREXA) disintegrating tablet 5 mg  5 mg Oral Q8H PRN Viann Fish E, MD   5 mg at 10/14/21 1416    And   ziprasidone (GEODON) injection 20 mg  20 mg Intramuscular PRN Harlow Asa, MD       Lab Results:  No results found for this or any previous visit (from the past 107 hour(s)).  Blood Alcohol level:  Lab Results  Component Value Date   ETH <10 17/00/1749   Metabolic Disorder Labs: Lab Results  Component Value Date   HGBA1C 5.3 09/08/2021   MPG 105.41 09/08/2021   No results found for: "PROLACTIN" Lab Results  Component Value Date   CHOL 173 09/08/2021   TRIG 110 09/08/2021   HDL 50 09/08/2021   CHOLHDL 3.5 09/08/2021   VLDL 22 09/08/2021   LDLCALC 101 (H) 09/08/2021   LDLCALC 102 (H) 06/21/2020   Physical Findings:  AIMS:  Facial and Oral Movements Muscles of Facial Expression: None, normal Lips and Perioral Area: None, normal Jaw: None, normal Tongue: None, normal,Extremity Movements Upper (arms, wrists, hands, fingers): None, normal Lower (legs, knees, ankles, toes): None, normal, Trunk Movements Neck, shoulders, hips: None, normal, Overall Severity Severity of abnormal movements (highest score from questions above): None, normal Incapacitation due to abnormal movements: None, normal Patient's awareness of abnormal movements (rate only patient's report): No Awareness, Dental Status Current problems with teeth and/or dentures?: No Does patient usually wear dentures?: No  No Cogwheeling or Rigidity Present.  Musculoskeletal: Strength & Muscle Tone: within normal limits Gait & Station: normal Patient leans: N/A  Psychiatric Specialty Exam:  Presentation  General Appearance: unkempt appearing  Eye Contact:Good  Speech:rambling but normal fluency  Speech Volume:Normal  Mood and Affect  Mood: appears anxious  Affect:anxious  Thought Process  Thought Processes: perseverative and ruminative  Orientation:Partial  Thought Content: Continues to have baseline, fixed delusions but does not mention these unless prompted with questions; is not  grossly responding to internal/external stimuli on exam; denies SI, HI, AVH or paranoia and denies ideas of reference or first rank symptoms but is guarded and perseverative about need for continued admission   Hallucinations:Hallucinations: None  Ideas of Reference:Denied  Suicidal Thoughts:Suicidal Thoughts: No  Homicidal Thoughts:Homicidal Thoughts: No  Sensorium  Memory:Limited   Judgment:Poor  Insight:Lacking  Executive Functions  Concentration:Fair  Attention Span:Fair  Independence of Knowledge:Limited  Language:Fair  Psychomotor Activity  Psychomotor Activity:Psychomotor Activity: Normal  Assets  Assets:Resilience  Sleep  7 hours  Physical Exam Vitals and nursing note reviewed.  Constitutional:      General: [name] not in acute distress.    Appearance: Normal appearance. [name] normal weight. [name] not ill-appearing or toxic-appearing.  HENT:     Head: Normocephalic and atraumatic.  Pulmonary:     Effort: Pulmonary effort is normal.  Musculoskeletal:        General: Normal range of motion.  Neurological:     General: No focal deficit present.     Mental Status: [name] alert.     Sensory: No sensory deficit.     Coordination: Coordination normal.  Psychiatric:        Behavior: Behavior normal.    Review of Systems  Respiratory:  Negative for cough and shortness of breath.   Cardiovascular:  Negative for chest pain.  Gastrointestinal:  Negative for abdominal pain, constipation, diarrhea, nausea and vomiting.  Neurological:  Positive for headaches (She took some tylenol. Mild). Negative for dizziness and weakness.  Psychiatric/Behavioral:  Positive for memory loss. Negative for depression (She rated her Depression as 5/10 on a scale of 1-10. 10 being the worst depression.), substance abuse and suicidal ideas. Hallucinations: paranoia.The patient is not nervous/anxious and does not have insomnia.    Blood pressure 108/76, pulse (!) 118,  temperature 98 F (36.7 C), temperature source Oral, resp. rate 17, height _0  (1.676 m), weight 72.6 kg, SpO2 99 %. Body mass index is 25.83 kg/m.  Treatment Plan Summary: Daily contact with patient to assess and evaluate symptoms and progress in treatment and Medication management  Safety and Monitoring: Involuntary admission to inpatient psychiatric unit for safety, stabilization and treatment Daily contact with patient to assess and evaluate symptoms and progress in treatment Patient's case to be discussed in multi-disciplinary team meeting Observation Level : q15 minute checks Vital signs: q12 hours Precautions: safety   Bipolar I MRE manic with psychotic features (r/o schizoaffective  d/o - bipolar type)             -Continue Haldol 5 mg AM and 10 mg QHS for psychosis and mood stabilization              -Continue Cogentin 0.5 mg twice daily             -Invega Sustenna 234 mg IM given on 09-30-21 136m IM given 10/12/20 - due for next monthly IM injection on 11/09/21             -- Zyprexa/Geodon/ativan agitation protocol PRN            -Additional psychosis labs (HIV- , RPR-, ANA-, heavy metal WNL, ESR wnl, ceruloplasmin wnl and CT head shows Marked for age brain atrophy.  No acute or reversible finding.)   -Depakote, Ativan, and Zyprexa stopped    Hyperammonemia: -Repeat ammonia level was trending down with last draw and patient currently asymptomatic-continuing to deny blood draws for repeat ammonia levels -Depakote was stopped    Mildly elevated creatinine - improved -Creatinine downtrending to 1.35 -> 1.20 -> 1.04 -We will keep on trending as she will cooperate for labs    Season allergies -Continue Claritin 123mdaily PRN    Urinary frequency- resolved -repeat UA with rare bacteria, but other wise, WNL  Discharge Planning: Social work and case management to assist with discharge planning and identification of hospital follow-up needs prior to discharge Estimated LOS:  5-7 days Discharge Concerns: Need to establish a safety plan; Medication compliance and effectiveness Discharge Goals: Return home with outpatient referrals for mental health follow-up including medication management/psychotherapy Labs On Admission- CMP: WNL except Cr: 1.1,  GFR: 60,  CBC: WNL,  UDS: Neg,  Troponin: 3,  EtOH: Neg,  beta hCG: 6.4,  Resp Panel: Neg 5/29- Urine Preg: Neg,  A1c: 5.3,  TSH: 1.719,  Lipid Panel: WNL except LDL: 101 5/24- BMP: WNL except Cr: 1.01,  Ca: 8.4,  hCG: 5,  Urine Preg: Neg 5/28- Ammonia: 32,  CBC: WNL,  CMP: WNL except  Cr: 1.08,  Ca: 8.6,  Depakote lvl: 20 6/3- Depakote lvl: 64, Hep Function: WNL,  CBC: WNL except Hem: 11.9 6/7- Depakote lvl: 75 6/18- UA: Neg,  CBC: WNL,  Depakote lvl: 136,  Ammonia: 99,  BMP: WNL except BUN: 22,  Cr: 1.19,  Ca: 8.6,  GFR: 54,  Hep Function: WNL except Albu: 3.3,  Indirect Bili: 0.2 6/20- ANA: Neg,  Ammonia: 48,  Ceruloplasmin: 24.2,  CMP: WNL except BUN: 23,  Cr: 1.35,  Ca: 8.3,  Albu: 3.4  HIV:Neg,  RPR: Neg,  Sed Rate: 6,  Depakote lvl: 25,  Heavy Metal: Neg 6/23- Ammonia: 46,  BMP: WNL except Cr: 1.2,  Ca: 8.3,  GFR: 54 6/24- UA: WNL except Bacteria: rare 6/30- CMP:  WNL except Cr: 1.04,  Ca: 8.6,  CBC: WNL,  Ammonia: 41, EKG: NSR with Qtc: 39Peachtree CornersNP 10/27/2021, 1:57 PMPatient ID: AnManuella Ghazifemale   DOB: 04/[id number].o.   MRN: [id number] ID: AnCHIANA WAMSERfemale   DOB: 04/[id number].o.   MRN: [id number] ID: AnALEECIA TAPIAfemale   DOB: [id number].o.   MRN: [id number]

## 2021-10-27 NOTE — Progress Notes (Signed)
The focus of this group is to help patients review their daily goal of treatment and discuss progress on daily workbooks.  Pt attended the evening group and responded to all discussion prompts from the Writer. Pt shared that today was a good day on the unit, the highlight of which was a visit from her boyfriend, whom she considers supportive.  Pt told that, upon feeling stressed out at home, she liked to go to [employer] as a positive way to relax. "I just love being there. I would love to even get a job there if I could."  Pt rated her day a 7 out of 10 and her affect was appropriate.

## 2021-10-27 NOTE — Progress Notes (Signed)
   10/27/21 0515  Sleep  Number of Hours 8.5

## 2021-10-27 NOTE — [facility] Group Notes (Signed)
Goals Group 78/2023   Group Focus: affirmation, clarity of thought, and goals/reality orientation Treatment Modality:  Psychoeducation Interventions utilized were assignment, group exercise, and support Purpose: To be able to understand and verbalize the reason for their admission to the hospital. To understand that the medication helps with their chemical imbalance but they also need to work on their choices in life. To be challenged to develop a list of 30 positives about themselves. Also introduce the concept that "feelings" are not reality.  Participation Level:  Active  Participation Quality:  Appropriate  Affect:  Appropriate  Cognitive:  Appropriate  Insight:  Improving  Engagement in Group:  Engaged  Additional Comments:  Pt continues to state that she wants to leave here. Rates her energy at a 5/10. Was able to write out a list and also read her past lists.  Dione Housekeeper

## 2021-10-27 NOTE — Group Note (Signed)
  [facility] LCSW Group Therapy Note  Date/Time:  10/27/2021 11:15am-12:00pm  Type of Therapy and Topic:  Group Therapy:  Self-Care after Hospitalization  Participation Level:  Active   Description of Group This process group involved patients discussing how they plan to take care of themselves in a better manner when they get home from the hospital.  The group started with patients listing one healthy and one unhealthy way they took care of themselves prior to hospitalization.  A discussion ensued about the differences in healthy and unhealthy coping skills.  Group members shared ideas about making changes when they return home so that they can stay well and in recovery.  The white board was used to list ideas so that patients can continue to see these ideas throughout the day.  Therapeutic Goals Patient will identify and describe one healthy and one unhealthy coping technique used prior to hospitalization Patient will participate in generating ideas about healthy self-care options when they return to the community Patients will be supportive of one another and receive said support from others Patient will identify one healthy self-care activity to add to his/her post-hospitalization life that can help in recovery  Summary of Patient Progress:  The patient expressed that one thing she looks forward to at discharge is having a place to live.  She stated that she knows she has applied for both SSI and Medicaid and is waiting for that to come through so she can get a place to live.  She also stated she plans to stay on her medicine at discharge, talked about being taken off some medicine while in the hospital.  Patient's participation in group was improved.  Her thoughts were more organized and on topic.   Therapeutic Modalities Brief Solution-Focused Therapy Motivational Interviewing Psychoeducation   Ambrose Mantle, LCSW 10/27/2021, 12:00pm

## 2021-10-27 NOTE — Progress Notes (Signed)
   10/27/21 2000  Psych Admission Type (Psych Patients Only)  Admission Status Involuntary  Psychosocial Assessment  Patient Complaints Confusion  Eye Contact Fair  Facial Expression Anxious  Affect Preoccupied  Speech Slow  Interaction Assertive  Motor Activity Slow  Appearance/Hygiene Layered clothes  Behavior Characteristics Cooperative  Mood Anxious  Thought Process  Coherency Circumstantial  Content WDL  Delusions None reported or observed  Perception WDL  Hallucination None reported or observed  Judgment Impaired  Confusion None  Danger to Self  Current suicidal ideation? Denies  Agreement Not to Harm Self Yes  Description of Agreement verbal  Danger to Others  Danger to Others None reported or observed

## 2021-10-27 NOTE — Progress Notes (Signed)
   10/27/21 1759  Psych Admission Type (Psych Patients Only)  Admission Status Involuntary  Psychosocial Assessment  Patient Complaints Confusion  Eye Contact Fair  Facial Expression Worried (Pt verbalized concern regarding discontinuation of Depakote.)  Affect Appropriate to circumstance  Speech Slow  Interaction Assertive  Motor Activity Slow  Appearance/Hygiene Improved  Behavior Characteristics Cooperative;Appropriate to situation  Mood Euthymic  Aggressive Behavior  Targets Other (Comment) (no aggressive behavior noted)  Type of Behavior Other (Comment)  Effect No apparent injury  Thought Process  Coherency Circumstantial  Content WDL  Delusions None reported or observed  Perception WDL  Hallucination None reported or observed  Judgment Impaired  Confusion None  Danger to Self  Current suicidal ideation? Denies  Agreement Not to Harm Self Yes  Description of Agreement verbal  Danger to Others  Danger to Others None reported or observed

## 2021-10-28 DIAGNOSIS — F312 Bipolar disorder, current episode manic severe with psychotic features: Secondary | ICD-10-CM | POA: Diagnosis not present

## 2021-10-28 NOTE — Progress Notes (Signed)
Adult Psychoeducational Group Note  Date:  10/28/2021 Time:  9:22 PM  Group Topic/Focus:  Wrap-Up Group:   The focus of this group is to help patients review their daily goal of treatment and discuss progress on daily workbooks.  Participation Level:  Active  Participation Quality:  Appropriate  Affect:  Appropriate  Cognitive:  Appropriate  Insight: Appropriate  Engagement in Group:  Developing/Improving  Modes of Intervention:  Discussion  Additional Comments:  Pt stated her goal for today was to focus on her treatment plan. Pt stated she accomplished her goal today. Pt stated she did not get a chance to speak with a doctor or with a social worker about her care today. Pt rated her overall day a 10. Pt stated the medication is helping her get back to her normal self. Pt stated she was able to contact her mother today which improved her overall day. Pt stated she felt better about herself tonight. Pt stated she was able to attend all meals. Pt stated she took all medications provided today. Pt stated her appetite was fair today. Pt rated her sleep last night was good. Pt stated the goal tonight was to get some rest. Pt stated she had no physical pain tonight. Pt deny visual hallucinations and auditory issues tonight. Pt denies thoughts of harming herself or others. Pt stated she would alert staff if anything changed.  Felipa Furnace 10/28/2021, 9:22 PM

## 2021-10-28 NOTE — Progress Notes (Addendum)
[facility] MD Progress Note  10/28/2021 2:47 PM Natalie Brown  MRN:  [id number]  Reason For Admission: Natalie Brown is a 56 yr old female who presented on 5/17 to [facility] with complaints of anxiety and feeling overwhelmed and SI without a plan, she was admitted to [facility] on 5/19.  PPHx is significant for possible Bipolar Disorder.  24 hr chart review: As per review of pt's chart, she has been medication compliant, and has not required a PRN medication the past 24 hrs. Her HR earlier today morning was 107, and BP was WNL. As per flow sheets, she slept a total 6.25 hrs last night. As per nursing documentation, over the past 24 hrs, she continues to be preoccupied, suspicious, confused, disorganized & anxious.  Today's patient assessment note: Today, pt was irritable earlier in the morning when being educated on the need to take a shower and change into other clothing, and allow staff to launder the clothing which she had on. She argued with writer and stated that she had business cards of her liaisons in her jacket pocket, and would not take off the jacket so that it can be laundered. Pt eventually changed into a T-shirt, but put same dirty jacket back on, even after multiple positive redirections. She wears a bonnet on her head, and would not also allow for that to be washed. She resisted showing staff her hair so that it can be checked for cleanliness, but eventually pulled bonnet off quickly and put it back on. Hair with four parts tied into buns, appears clean, but uncombed. She reported that she took a shower earlier today at 5 am. Staff informed to continue to reinforce and give positive reinforcements for pt  to tend to personal hygiene and grooming. Speech is clear & coherent, but thoughts remain disorganized, with illogical contents. She currently denies SI/HI/AVH, but remains suspicious of staff. Pt was given positive reinforcements to allow for blood draws yesterday for repeat ammonia levels. As per nursing  staff, it took 30 minutes of positive reinforcements yesterday for pt to allow for blood draws. Repeat ammonia level was WNL. GFR was low at 48, and CR-1.33. Pt will need Primary care provider f/u after discharge.  Pt continues to report a good sleep quality & continues to reports a good appetite, and denies any medication related side effects. There continues to be no abnormal movements of any of her extremities, and there is no stiffness or any other EPS related symptoms. AIMS: 0. Will continue medications as listed below.  Principal Problem: Bipolar affective disorder, current episode manic with psychotic symptoms (HCC) Diagnosis: Principal Problem:   Bipolar affective disorder, current episode manic with psychotic symptoms (HCC) Active Problems:   Increased ammonia level   Valproic acid causing adverse effect in therapeutic use   Altered mental status  Total Time spent with patient:  I personally spent 30 minutes on the unit in direct patient care. The direct patient care time included face-to-face time with the patient, reviewing the patient's chart, communicating with other professionals, and coordinating care. Greater than 50% of this time was spent in counseling or coordinating care with the patient regarding goals of hospitalization, psycho-education, and discharge planning needs.  Past Psychiatric History: Probable bipolar disorder  Past Medical History:  Past Medical History:  Diagnosis Date   Bipolar 1 disorder with moderate mania (HCC) 12/30/2013   Hypertension    History reviewed. No pertinent surgical history. Family History: History reviewed. No pertinent family history. Family Psychiatric    History: Patient reports mental illness runs in her father's side of the family Social History:  Social History   Substance and Sexual Activity  Alcohol Use No     Social History   Substance and Sexual Activity  Drug Use No    Social History   Socioeconomic History   Marital  status: Married    Spouse name: Not on file   Number of children: Not on file   Years of education: Not on file   Highest education level: Not on file  Occupational History   Not on file  Tobacco Use   Smoking status: Every Day   Smokeless tobacco: Current  Substance and Sexual Activity   Alcohol use: No   Drug use: No   Sexual activity: Not on file  Other Topics Concern   Not on file  Social History Narrative   Not on file   Social Determinants of Health   Financial Resource Strain: Not on file  Food Insecurity: Not on file  Transportation Needs: Not on file  Physical Activity: Not on file  Stress: Not on file  Social Connections: Not on file   Additional Social History:   Sleep: Good  Appetite:  Good  Current Medications: Current Facility-Administered Medications  Medication Dose Route Frequency Provider Last Rate Last Admin   acetaminophen (TYLENOL) tablet 650 mg  650 mg Oral Q6H PRN Briant Cedar, MD   650 mg at 10/28/21 0757   alum & mag hydroxide-simeth (MAALOX/MYLANTA) 200-200-20 MG/5ML suspension 30 mL  30 mL Oral Q4H PRN Massengill, [name], MD       benztropine (COGENTIN) tablet 0.5 mg  0.5 mg Oral BID Armando Reichert, MD   0.5 mg at 10/28/21 0754   feeding supplement (KATE FARMS STANDARD 1.4) liquid 325 mL  325 mL Oral BID BM Massengill, Nathan, MD   325 mL at 10/28/21 1337   fluticasone (FLONASE) 50 MCG/ACT nasal spray 1 spray  1 spray Each Nare Daily Massengill, [name], MD   1 spray at 10/28/21 0754   haloperidol (HALDOL) tablet 10 mg  10 mg Oral QHS Nelda Marseille, Kaige Whistler E, MD   10 mg at 10/27/21 1956   haloperidol (HALDOL) tablet 5 mg  5 mg Oral Daily Viann Fish E, MD   5 mg at 10/28/21 3474   hydrocerin (EUCERIN) cream   Topical BID Janine Limbo, MD   Given at 10/28/21 0755   hydrOXYzine (ATARAX) tablet 25 mg  25 mg Oral TID PRN Janine Limbo, MD   25 mg at 10/23/21 0802   LORazepam (ATIVAN) tablet 1 mg  1 mg Oral Q6H PRN Viann Fish E, MD    1 mg at 10/07/21 1546   magnesium hydroxide (MILK OF MAGNESIA) suspension 30 mL  30 mL Oral Daily PRN Massengill, [name], MD       nicotine polacrilex (NICORETTE) gum 2 mg  2 mg Oral PRN Winfred Leeds, Nadir, MD   2 mg at 10/27/21 1940   OLANZapine zydis (ZYPREXA) disintegrating tablet 5 mg  5 mg Oral Q8H PRN Viann Fish E, MD   5 mg at 10/14/21 1416   And   ziprasidone (GEODON) injection 20 mg  20 mg Intramuscular PRN Harlow Asa, MD       Lab Results:  Results for orders placed or performed during the hospital encounter of 09/06/21 (from the past 48 hour(s))  Ammonia     Status: None   Collection Time: 10/27/21  6:32 PM  Result Value Ref Range  Ammonia 30 9 - 35 umol/L    Comment: Performed at [facility], [place] [address] [name], [place] [address]  Comprehensive metabolic panel     Status: Abnormal   Collection Time: 10/27/21  6:32 PM  Result Value Ref Range   Sodium 140 135 - 145 mmol/L   Potassium 4.0 3.5 - 5.1 mmol/L   Chloride 107 98 - 111 mmol/L   CO2 24 22 - 32 mmol/L   Glucose, Bld 88 70 - 99 mg/dL    Comment: Glucose reference range applies only to samples taken after fasting for at least 8 hours.   BUN 19 6 - 20 mg/dL   Creatinine, Ser 1.33 (H) 0.44 - 1.00 mg/dL   Calcium 8.9 8.9 - 10.3 mg/dL   Total Protein 7.4 6.5 - 8.1 g/dL   Albumin 3.6 3.5 - 5.0 g/dL   AST 19 15 - 41 U/L   ALT 13 0 - 44 U/L   Alkaline Phosphatase 56 38 - 126 U/L   Total Bilirubin 0.6 0.3 - 1.2 mg/dL   GFR, Estimated 48 (L) >60 mL/min    Comment: (NOTE) Calculated using the CKD-EPI Creatinine Equation (2021)    Anion gap 9 5 - 15    Comment: Performed at [facility], [place] [address], [place], [place] [address]    Blood Alcohol level:  Lab Results  Component Value Date   ETH <10 68/06/2120   Metabolic Disorder Labs: Lab Results  Component Value Date   HGBA1C 5.3 09/08/2021   MPG 105.41 09/08/2021   No results found for: "PROLACTIN" Lab  Results  Component Value Date   CHOL 173 09/08/2021   TRIG 110 09/08/2021   HDL 50 09/08/2021   CHOLHDL 3.5 09/08/2021   VLDL 22 09/08/2021   LDLCALC 101 (H) 09/08/2021   LDLCALC 102 (H) 06/21/2020   Physical Findings:  AIMS: Facial and Oral Movements Muscles of Facial Expression: None, normal Lips and Perioral Area: None, normal Jaw: None, normal Tongue: None, normal,Extremity Movements Upper (arms, wrists, hands, fingers): None, normal Lower (legs, knees, ankles, toes): None, normal, Trunk Movements Neck, shoulders, hips: None, normal, Overall Severity Severity of abnormal movements (highest score from questions above): None, normal Incapacitation due to abnormal movements: None, normal Patient's awareness of abnormal movements (rate only patient's report): No Awareness, Dental Status Current problems with teeth and/or dentures?: No Does patient usually wear dentures?: No  No Cogwheeling or Rigidity Present.  Musculoskeletal: Strength & Muscle Tone: within normal limits Gait & Station: normal Patient leans: N/A  Psychiatric Specialty Exam:  Presentation  General Appearance: unkempt appearing   Eye Contact:Good  Speech:rambling but normal fluency  Speech Volume:Normal  Mood and Affect  Mood: appears anxious and at times irritable when discussing ADLs and clothes washing  Affect:anxious  Thought Process  Thought Processes: perseverative and ruminative  Orientation:Partial  Thought Content: Continues to have baseline, fixed delusions but does not mention these unless prompted with questions; is not grossly responding to internal/external stimuli on exam; denies SI, HI, AVH or paranoia and denies ideas of reference or first rank symptoms but is guarded and perseverative about need for continued admission   Hallucinations:Hallucinations: None  Ideas of Reference:Denied  Suicidal Thoughts:Suicidal Thoughts: No  Homicidal Thoughts:Homicidal Thoughts:  No  Sensorium  Memory:Limited   Judgment:Poor  Insight:Poor  Executive Functions  Concentration:Fair  Attention Span:Fair  [place] of Knowledge:Limited  Language:Fair  Psychomotor Activity  Psychomotor Activity:Psychomotor Activity: Normal  Assets  Assets:Resilience  Sleep  6.25 hours  Physical Exam Vitals and nursing note reviewed.  Constitutional:      General: [name] not in acute distress.    Appearance: Normal appearance. [name] normal weight. [name] not ill-appearing or toxic-appearing.  HENT:     Head: Normocephalic and atraumatic.  Pulmonary:     Effort: Pulmonary effort is normal.  Musculoskeletal:        General: Normal range of motion.  Neurological:     General: No focal deficit present.     Mental Status: [name] alert.     Sensory: No sensory deficit.     Coordination: Coordination normal.  Psychiatric:        Behavior: Behavior normal.    Review of Systems  Respiratory:  Negative for cough and shortness of breath.   Cardiovascular:  Negative for chest pain.  Gastrointestinal:  Negative for abdominal pain, constipation, diarrhea, nausea and vomiting.  Neurological:  Positive for headaches (She took some tylenol. Mild). Negative for dizziness and weakness.  Psychiatric/Behavioral:  Positive for memory loss. Negative for depression (She rated her Depression as 5/10 on a scale of 1-10. 10 being the worst depression.), substance abuse and suicidal ideas. Hallucinations: paranoia.The patient is not nervous/anxious and does not have insomnia.    Blood pressure 123/69, pulse (!) 107, temperature 98.2 F (36.8 C), temperature source Oral, resp. rate 17, height 5' 6" (1.676 m), weight 72.6 kg, SpO2 100 %. Body mass index is 25.83 kg/m.  Treatment Plan Summary: Daily contact with patient to assess and evaluate symptoms and progress in treatment and Medication management  Safety and Monitoring: Involuntary admission to inpatient psychiatric  unit for safety, stabilization and treatment Daily contact with patient to assess and evaluate symptoms and progress in treatment Patient's case to be discussed in multi-disciplinary team meeting Observation Level : q15 minute checks Vital signs: q12 hours Precautions: safety   Bipolar I MRE manic with psychotic features (r/o schizoaffective d/o - bipolar type)             -Continue Haldol 5 mg AM and 10 mg QHS for psychosis and mood stabilization              -Continue Cogentin 0.5 mg twice daily             -Invega Sustenna 234 mg IM given on 09-30-21 140m IM given 10/12/20 - due for next monthly IM injection on 11/09/21             -- Zyprexa/Geodon/ativan agitation protocol PRN            -Additional psychosis labs (HIV- , RPR-, ANA-, heavy metal WNL, ESR wnl, ceruloplasmin wnl and CT head shows Marked for age brain atrophy.  No acute or reversible finding.)   -Depakote, Ativan, and Zyprexa stopped    Hyperammonemia -resolved -Ammonia level 30 on 10/27/21 -Depakote was stopped    Mildly elevated creatinine  -Creatinine up to 1.33 -We will keep on trending as she will cooperate for labs and encouraging po intake    Season allergies -Continue Claritin 156mdaily PRN    Urinary frequency- resolved -repeat UA with rare bacteria, but other wise, WNL  Discharge Planning: Social work and case management to assist with discharge planning and identification of hospital follow-up needs prior to discharge Estimated LOS: 5-7 days Discharge Concerns: Need to establish a safety plan; Medication compliance and effectiveness Discharge Goals: Return home with outpatient referrals for mental health follow-up including medication management/psychotherapy Labs On  Admission- CMP: WNL except Cr: 1.1,  GFR: 60,  CBC: WNL,  UDS: Neg,  Troponin: 3,  EtOH: Neg,  beta hCG: 6.4,  Resp Panel: Neg 5/29- Urine Preg: Neg,  A1c: 5.3,  TSH: 1.719,  Lipid Panel: WNL except LDL: 101 5/24- BMP: WNL except Cr: 1.01,   Ca: 8.4,  hCG: 5,  Urine Preg: Neg 5/28- Ammonia: 32,  CBC: WNL,  CMP: WNL except  Cr: 1.08,  Ca: 8.6,  Depakote lvl: 20 6/3- Depakote lvl: 64, Hep Function: WNL,  CBC: WNL except Hem: 11.9 6/7- Depakote lvl: 75 6/18- UA: Neg,  CBC: WNL,  Depakote lvl: 136,  Ammonia: 99,  BMP: WNL except BUN: 22,  Cr: 1.19,  Ca: 8.6,  GFR: 54,  Hep Function: WNL except Albu: 3.3,  Indirect Bili: 0.2 6/20- ANA: Neg,  Ammonia: 48,  Ceruloplasmin: 24.2,  CMP: WNL except BUN: 23,  Cr: 1.35,  Ca: 8.3,  Albu: 3.4  HIV:Neg,  RPR: Neg,  Sed Rate: 6,  Depakote lvl: 25,  Heavy Metal: Neg 6/23- Ammonia: 46,  BMP: WNL except Cr: 1.2,  Ca: 8.3,  GFR: 54 6/24- UA: WNL except Bacteria: rare 6/30- CMP:  WNL except Cr: 1.04,  Ca: 8.6,  CBC: WNL,  Ammonia: 41, EKG: NSR with Qtc: 399 7/8 - CMP WNL other than CR 1.33 and GFR 48; Ammonia 30  Nicholes Rough, NP 10/28/2021, 2:47 PMPatient ID: Natalie Brown, female   DOB: 12/14/1965, 56 y.o.   MRN: [id number] Patient ID: Natalie Brown, female   DOB: 14-Dec-1965, 56 y.o.   MRN: [id number] Patient ID: Natalie Brown, female   DOB: 19-Jun-1965, 56 y.o.   MRN: [id number] Patient ID: Natalie Brown, female   DOB: [birth year], 56 y.o.   MRN: [id number]

## 2021-10-28 NOTE — Progress Notes (Signed)
   10/28/21 0556  Sleep  Number of Hours 6.25

## 2021-10-28 NOTE — Group Note (Signed)
[facility] LCSW Group Therapy Note  Date/Time:  10/28/2021 10:00am-11:00am  Type of Therapy and Topic:  Group Therapy:  Healthy and Unhealthy Supports  Participation Level:  Active   Description of Group:  Patients in this group were introduced to the idea of adding a variety of healthy supports to address the various needs in their lives, especially in reference to their plans and focus for the new year.  Patients discussed what additional healthy supports could be helpful in their recovery and wellness after discharge in order to prevent future hospitalizations such as counselor, doctor, other levels of psychiatric care such as ACTT services, therapy groups, 12-step groups, and problem-specific support groups.  A demonstration was given about how to set boundaries which patients expressed was beneficial.  Several songs were played to inspire patients to be more self-supportive.  Therapeutic Goals:   1)  discuss importance of adding supports to stay well once out of the hospital  2)  compare healthy versus unhealthy supports and identify some examples of each  3)  generate ideas and descriptions of healthy supports that can be added  4)  offer mutual support about how to address unhealthy supports  5)  encourage active participation in and adherence to discharge plan    Summary of Patient Progress:  The patient stated that current healthy supports in her life are her mother and sister.  The patient expressed little in group.   Therapeutic Modalities:   Motivational Interviewing Brief Solution-Focused Therapy  Ambrose Mantle, LCSW

## 2021-10-28 NOTE — Progress Notes (Signed)
Pt lying in bed with eyes closed, respirations even/unlabored, no s/s of distress (a) 15 min checks (r) safety maintained.

## 2021-10-28 NOTE — [facility] Group Notes (Signed)
Adult Psychoeducational Group Note Date:  10/28/2021 Time:  0900-1045 Group Topic/Focus: PROGRESSIVE RELAXATION. A group where deep breathing is taught and tensing and relaxation muscle groups is used. Imagery is used as well.  Pts are asked to imagine 3 pillars that hold them up when they are not able to hold themselves up and to share that with the group.  Participation Level:  Active  Participation Quality:  Appropriate  Affect:  Appropriate  Cognitive:  Oriented  Insight: Improving  Engagement in Group:  Engaged  Modes of Intervention:  Activity, Discussion, Education, and Support  Additional Comments:  Pt rates her energy at a 10/10. States what holds her up is the groups and the hospital. Yet, pt continues to state over and over "I just want to leave here. I have been here since 09/06/21. It's time for me to leave".  Dione Housekeeper

## 2021-10-29 ENCOUNTER — Encounter (HOSPITAL_COMMUNITY): Payer: Self-pay

## 2021-10-29 DIAGNOSIS — F312 Bipolar disorder, current episode manic severe with psychotic features: Secondary | ICD-10-CM | POA: Diagnosis not present

## 2021-10-29 NOTE — Group Note (Signed)
LCSW Group Therapy Note   Group Date: 10/29/2021 Start Time: 1300 End Time: 1400   Type of Therapy and Topic:  Group Therapy: Boundaries  Participation Level:  Minimal  Description of Group: This group will address the use of boundaries in their personal lives. Patients will explore why boundaries are important, the difference between healthy and unhealthy boundaries, and negative and postive outcomes of different boundaries and will look at how boundaries can be crossed.  Patients will be encouraged to identify current boundaries in their own lives and identify what kind of boundary is being set. Facilitators will guide patients in utilizing problem-solving interventions to address and correct types boundaries being used and to address when no boundary is being used. Understanding and applying boundaries will be explored and addressed for obtaining and maintaining a balanced life. Patients will be encouraged to explore ways to assertively make their boundaries and needs known to significant others in their lives, using other group members and facilitator for role play, support, and feedback.  Therapeutic Goals:  1.  Patient will identify areas in their life where setting clear boundaries could be  used to improve their life.  2.  Patient will identify signs/triggers that a boundary is not being respected. 3.  Patient will identify two ways to set boundaries in order to achieve balance in  their lives: 4.  Patient will demonstrate ability to communicate their needs and set boundaries  through discussion and/or role plays  Summary of Patient Progress:  Natalie Brown was present/active throughout the session and proved open to feedback from CSW and peers. Patient demonstrated fair insight into the subject matter, was respectful of peers, and was present throughout the entire session.  Therapeutic Modalities:   Cognitive Behavioral Therapy Solution-Focused Therapy  Beatris Si,  LCSW 10/29/2021  2:29 PM

## 2021-10-29 NOTE — Progress Notes (Signed)
Adult Psychoeducational Group Note  Date:  10/29/2021 Time:  8:36 PM  Group Topic/Focus:  Wrap-Up Group:   The focus of this group is to help patients review their daily goal of treatment and discuss progress on daily workbooks.  Participation Level:  Active  Participation Quality:  Appropriate  Affect:  Appropriate  Cognitive:  Appropriate  Insight: Appropriate  Engagement in Group:  Developing/Improving  Modes of Intervention:  Discussion  Additional Comments:   Pt stated her goal for today was to focus on her treatment plan. Pt stated she accomplished her goal today. Pt stated she did not get a chance to speak with a doctor or with a social worker about her care today. Pt rated her overall day a 5 out of 10. Pt stated she was able to contact her mother and her mother coming for visitation tonight improved her overall day. Pt stated she felt better about herself tonight. Pt stated she was able to attend all meals. Pt stated she took all medications provided today. Pt stated her appetite was good today. Pt rated her sleep last night was good. Pt stated the goal tonight was to get some rest. Pt stated she had no physical pain tonight. Pt deny visual hallucinations and auditory issues tonight. Pt denies thoughts of harming herself or others. Pt stated she would alert staff if anything changed.  Natalie Brown 10/29/2021, 8:36 PM

## 2021-10-29 NOTE — Progress Notes (Signed)
Natalie Brown was in the day room much of the evening.  She reported having a good visit with her mother.  She did attend evening wrap up group.  She fell asleep in the day room and this writer walked her to her room after bedtime medications.  No prn's needed at this time.  She denied SI/HI or AVH.  [name] currently resting with her eyes closed and appears to be asleep.   10/29/21 2104  Psych Admission Type (Psych Patients Only)  Admission Status Involuntary  Psychosocial Assessment  Patient Complaints Confusion  Eye Contact Fair  Facial Expression Blank  Affect Preoccupied  Speech Soft;Slow  Interaction Assertive  Motor Activity Slow  Appearance/Hygiene Disheveled;Layered clothes  Behavior Characteristics Cooperative;Appropriate to situation  Mood Preoccupied  Thought Process  Coherency Circumstantial;Disorganized  Content Preoccupation  Delusions None reported or observed  Perception WDL  Hallucination None reported or observed  Judgment Impaired  Confusion Moderate  Danger to Self  Current suicidal ideation? Denies  Danger to Others  Danger to Others None reported or observed

## 2021-10-29 NOTE — Progress Notes (Signed)
Natalie Brown was isolative to her room at the beginning of the shift.  She did come out to the day room for group, snacks and to watch tv.  She denied SI/HI or AVH.  She was focused on being discharged because "I miss my dental floss, nail clippers and nail files."  She reported family not visiting today made her sad but that her mother is going to visit tomorrow.  No prns requested this evening.  [name] currently resting with her eyes closed and appears to be asleep.  Q 15 minute checks maintained for safety.   10/28/21 2102  Psych Admission Type (Psych Patients Only)  Admission Status Involuntary  Psychosocial Assessment  Patient Complaints Isolation;Confusion  Eye Contact Fair  Facial Expression Anxious;Worried  Affect Preoccupied;Anxious  Speech Slow  Interaction Assertive  Motor Activity Slow  Appearance/Hygiene Disheveled;Layered clothes  Behavior Characteristics Cooperative;Anxious  Mood Anxious;Preoccupied;Pleasant  Thought Process  Coherency Circumstantial;Disorganized  Content Preoccupation  Delusions None reported or observed  Perception WDL  Hallucination None reported or observed  Judgment Limited  Confusion Moderate  Danger to Self  Current suicidal ideation? Denies  Danger to Others  Danger to Others None reported or observed

## 2021-10-29 NOTE — Progress Notes (Addendum)
[facility] MD Progress Note  10/29/2021 12:07 PM Natalie Brown  MRN:  [id number]  Reason For Admission: Natalie Brown is a 56 yr old female who presented on 5/17 to [facility] with complaints of anxiety and feeling overwhelmed and SI without a plan, she was admitted to [facility] on 5/19.  PPHx is significant for possible Bipolar Disorder.  24 hr chart review: As per review of pt's chart, she has been medication compliant, and has not required a PRN medication the past 24 hrs. Her HR and BP were WNL today. As per flow sheets, she reported a good sleep through the night. As per nursing documentation, over the past 24 hrs, she continues to be preoccupied, suspicious, confused, disorganized & anxious.  Today's patient assessment note:  The Pt was a bit drowsy when the provider knocked on her door but easily aroused. She appears slow and a bit confused this morning. Pt. grooming and hygiene were poor and she has to be reminded severally to change her clothes and take a shower. The patient's speech was low in volume and rate. His communications not direct and relevant to the questions asked, she was redirected to the questions.The patient's eye contact was fair.The patient's motor activity was unremarkable, with a normal gait. The Patient described her mood as "Ok" and her Affect was somewhat bizarre and Flat. However, she is oriented to person, place, and situation. Her thought process was disorganized, and have to be redirected back to the topic of discussion.The Patient did not appear to be attending to internal stimuli during the discussion. She denied auditory and visual hallucinations.   The Patient appears a bit slow and confused but she is alert and oriented to self, place and situation. She reported good appetite and a good night sleep.Her hygiene remains poor. Though she reported having her shower at 0400 but told the writer that she slept through the night. The Pt was observed wearing same clothes and jacket.since  yesterday. She agreed to change her pant but refused to give up her jackets to be washed. Her thoughts process remains disorganized and she have to be redirected to the questions asked. The patient did have some delusional thoughts. She informed this provider that she speaks six different international languages including [place], [place], [place] and having six different degrees including a degree from [employer]. She denied current suicidal/homicidal ideation/plan/intent.   Vital signs reviewed - WNL AIMS = 0    Principal Problem: Bipolar affective disorder, current episode manic with psychotic symptoms (HCC) Diagnosis: Principal Problem:   Bipolar affective disorder, current episode manic with psychotic symptoms ([place]) Active Problems:   Increased ammonia level   Valproic acid causing adverse effect in therapeutic use   Altered mental status  Total Time spent with patient:  I personally spent 30 minutes on the unit in direct patient care. The direct patient care time included face-to-face time with the patient, reviewing the patient's chart, communicating with other professionals, and coordinating care. Greater than 50% of this time was spent in counseling or coordinating care with the patient regarding goals of hospitalization, psycho-education, and discharge planning needs.  Past Psychiatric History: Probable bipolar disorder  Past Medical History:  Past Medical History:  Diagnosis Date   Bipolar 1 disorder with moderate mania ([place]) 12/30/2013   Hypertension    History reviewed. No pertinent surgical history. Family History: History reviewed. No pertinent family history. Family Psychiatric  History: Patient reports mental illness runs in her father's side of the family Social History:  Social History   Substance and Sexual Activity  Alcohol Use No     Social History   Substance and Sexual Activity  Drug Use No    Social History   Socioeconomic History   Marital  status: Married    Spouse name: Not on file   Number of children: Not on file   Years of education: Not on file   Highest education level: Not on file  Occupational History   Not on file  Tobacco Use   Smoking status: Every Day   Smokeless tobacco: Current  Substance and Sexual Activity   Alcohol use: No   Drug use: No   Sexual activity: Not on file  Other Topics Concern   Not on file  Social History Narrative   Not on file   Social Determinants of Health   Financial Resource Strain: Not on file  Food Insecurity: Not on file  Transportation Needs: Not on file  Physical Activity: Not on file  Stress: Not on file  Social Connections: Not on file   Additional Social History:   Sleep: Good  Appetite:  Good  Current Medications: Current Facility-Administered Medications  Medication Dose Route Frequency Provider Last Rate Last Admin   acetaminophen (TYLENOL) tablet 650 mg  650 mg Oral Q6H PRN Briant Cedar, MD   650 mg at 10/28/21 0757   alum & mag hydroxide-simeth (MAALOX/MYLANTA) 200-200-20 MG/5ML suspension 30 mL  30 mL Oral Q4H PRN Massengill, [name], MD       benztropine (COGENTIN) tablet 0.5 mg  0.5 mg Oral BID Armando Reichert, MD   0.5 mg at 10/29/21 1062   feeding supplement (KATE FARMS STANDARD 1.4) liquid 325 mL  325 mL Oral BID BM Massengill, [name], MD   325 mL at 10/29/21 0821   fluticasone (FLONASE) 50 MCG/ACT nasal spray 1 spray  1 spray Each Nare Daily Massengill, [name], MD   1 spray at 10/29/21 6948   haloperidol (HALDOL) tablet 10 mg  10 mg Oral QHS Nelda Marseille, Amy E, MD   10 mg at 10/28/21 2102   haloperidol (HALDOL) tablet 5 mg  5 mg Oral Daily Harlow Asa, MD   5 mg at 10/29/21 5462   hydrocerin (EUCERIN) cream   Topical BID Janine Limbo, MD   Given at 10/29/21 7035   hydrOXYzine (ATARAX) tablet 25 mg  25 mg Oral TID PRN Janine Limbo, MD   25 mg at 10/23/21 0802   LORazepam (ATIVAN) tablet 1 mg  1 mg Oral Q6H PRN Viann Fish E, MD    1 mg at 10/07/21 1546   magnesium hydroxide (MILK OF MAGNESIA) suspension 30 mL  30 mL Oral Daily PRN Massengill, [name], MD       nicotine polacrilex (NICORETTE) gum 2 mg  2 mg Oral PRN Winfred Leeds, Nadir, MD   2 mg at 10/27/21 1940   OLANZapine zydis (ZYPREXA) disintegrating tablet 5 mg  5 mg Oral Q8H PRN Viann Fish E, MD   5 mg at 10/14/21 1416   And   ziprasidone (GEODON) injection 20 mg  20 mg Intramuscular PRN Harlow Asa, MD       Lab Results:  Results for orders placed or performed during the hospital encounter of 09/06/21 (from the past 48 hour(s))  Ammonia     Status: None   Collection Time: 10/27/21  6:32 PM  Result Value Ref Range   Ammonia 30 9 - 35 umol/L    Comment: Performed at [facility], [place] [address], [place], [place] [address]  Comprehensive metabolic panel     Status: Abnormal   Collection Time: 10/27/21  6:32 PM  Result Value Ref Range   Sodium 140 135 - 145 mmol/L   Potassium 4.0 3.5 - 5.1 mmol/L   Chloride 107 98 - 111 mmol/L   CO2 24 22 - 32 mmol/L   Glucose, Bld 88 70 - 99 mg/dL    Comment: Glucose reference range applies only to samples taken after fasting for at least 8 hours.   BUN 19 6 - 20 mg/dL   Creatinine, Ser 1.33 (H) 0.44 - 1.00 mg/dL   Calcium 8.9 8.9 - 10.3 mg/dL   Total Protein 7.4 6.5 - 8.1 g/dL   Albumin 3.6 3.5 - 5.0 g/dL   AST 19 15 - 41 U/L   ALT 13 0 - 44 U/L   Alkaline Phosphatase 56 38 - 126 U/L   Total Bilirubin 0.6 0.3 - 1.2 mg/dL   GFR, Estimated 48 (L) >60 mL/min    Comment: (NOTE) Calculated using the CKD-EPI Creatinine Equation (2021)    Anion gap 9 5 - 15    Comment: Performed at [facility], [place] [address], [place], [place] [address]    Blood Alcohol level:  Lab Results  Component Value Date   ETH <10 50/27/7412   Metabolic Disorder Labs: Lab Results  Component Value Date   HGBA1C 5.3 09/08/2021   MPG 105.41 09/08/2021   No results found for: "PROLACTIN" Lab  Results  Component Value Date   CHOL 173 09/08/2021   TRIG 110 09/08/2021   HDL 50 09/08/2021   CHOLHDL 3.5 09/08/2021   VLDL 22 09/08/2021   LDLCALC 101 (H) 09/08/2021   LDLCALC 102 (H) 06/21/2020   Physical Findings:  AIMS: Facial and Oral Movements Muscles of Facial Expression: None, normal Lips and Perioral Area: None, normal Jaw: None, normal Tongue: None, normal,Extremity Movements Upper (arms, wrists, hands, fingers): None, normal Lower (legs, knees, ankles, toes): None, normal, Trunk Movements Neck, shoulders, hips: None, normal, Overall Severity Severity of abnormal movements (highest score from questions above): None, normal Incapacitation due to abnormal movements: None, normal Patient's awareness of abnormal movements (rate only patient's report): No Awareness, Dental Status Current problems with teeth and/or dentures?: No Does patient usually wear dentures?: No  No Cogwheeling or Rigidity Present.  Musculoskeletal: Strength & Muscle Tone: within normal limits Gait & Station: normal Patient leans: N/A  Psychiatric Specialty Exam:  Presentation  General Appearance: unkempt appearing   Eye Contact:Good  Speech:rambling but normal fluency  Speech Volume:Normal  Mood and Affect  Mood: appears anxious and at times irritable when discussing ADLs and clothes washing  Affect:anxious  Thought Process  Thought Processes: perseverative and ruminative  Orientation:Partial  Thought Content: Continues to have baseline, fixed delusions but does not mention these unless prompted with questions; is not grossly responding to internal/external stimuli on exam; denies SI, HI, AVH or paranoia and denies ideas of reference or first rank symptoms but is guarded and perseverative about need for continued admission   Hallucinations:Hallucinations: None  Ideas of Reference:Denied  Suicidal Thoughts:Suicidal Thoughts: No  Homicidal Thoughts:Homicidal Thoughts:  No  Sensorium  Memory:Limited   Judgment:Poor  Insight:Poor  Executive Functions  Concentration:Fair  Attention Span:Fair  [place] of Knowledge:Limited  Language:Fair  Psychomotor Activity  Psychomotor Activity:Psychomotor Activity: Normal  Assets  Assets:Resilience  Sleep  6.25 hours  Physical Exam Vitals and nursing note reviewed.  Constitutional:  General: [name] not in acute distress.    Appearance: Normal appearance. [name] normal weight. [name] not ill-appearing or toxic-appearing.  HENT:     Head: Normocephalic and atraumatic.  Pulmonary:     Effort: Pulmonary effort is normal.  Musculoskeletal:        General: Normal range of motion.  Neurological:     General: No focal deficit present.     Mental Status: [name] alert.     Sensory: No sensory deficit.     Coordination: Coordination normal.  Psychiatric:        Behavior: Behavior normal.    Review of Systems  Respiratory:  Negative for cough and shortness of breath.   Cardiovascular:  Negative for chest pain.  Gastrointestinal:  Negative for abdominal pain, constipation, diarrhea, nausea and vomiting.  Neurological:  Positive for headaches (She took some tylenol. Mild). Negative for dizziness and weakness.  Psychiatric/Behavioral:  Positive for memory loss. Negative for depression (She rated her Depression as 5/10 on a scale of 1-10. 10 being the worst depression.), substance abuse and suicidal ideas. Hallucinations: paranoia.The patient is not nervous/anxious and does not have insomnia.    Blood pressure 116/78, pulse (!) 113, temperature 98.2 F (36.8 C), temperature source Oral, resp. rate 17, height 5' 6"  (1.676 m), weight 72.6 kg, SpO2 100 %. Body mass index is 25.83 kg/m.  Treatment Plan Summary: Daily contact with patient to assess and evaluate symptoms and progress in treatment and Medication management  Safety and Monitoring: Involuntary admission to inpatient psychiatric  unit for safety, stabilization and treatment Daily contact with patient to assess and evaluate symptoms and progress in treatment Patient's case to be discussed in multi-disciplinary team meeting Observation Level : q15 minute checks Vital signs: q12 hours Precautions: safety   Bipolar I MRE manic with psychotic features (r/o schizoaffective d/o - bipolar type)             -Continue Haldol 5 mg AM and 10 mg QHS for psychosis and mood stabilization              -Continue Cogentin 0.5 mg twice daily             -Invega Sustenna 234 mg IM given on 09-30-21 181m IM given 10/12/20 - due for next monthly IM injection on 11/09/21             -- Zyprexa/Geodon/ativan agitation protocol PRN            -Additional psychosis labs (HIV- , RPR-, ANA-, heavy metal WNL, ESR wnl, ceruloplasmin wnl and CT head shows Marked for age brain atrophy.  No acute or reversible finding.)   -Depakote, Ativan, and Zyprexa stopped    Hyperammonemia -resolved -Ammonia level 30 on 10/27/21 -Depakote was stopped    Mildly elevated creatinine  -Creatinine up to 1.33 -We will keep on trending as she will cooperate for labs and encouraging po intake    Season allergies -Continue Claritin 197mdaily PRN    Urinary frequency- resolved -repeat UA with rare bacteria, but other wise, WNL  Discharge Planning: Social work and case management to assist with discharge planning and identification of hospital follow-up needs prior to discharge Estimated LOS: 5-7 days Discharge Concerns: Need to establish a safety plan; Medication compliance and effectiveness Discharge Goals: Return home with outpatient referrals for mental health follow-up including medication management/psychotherapy Labs On Admission- CMP: WNL except Cr: 1.1,  GFR: 60,  CBC: WNL,  UDS: Neg,  Troponin: 3,  EtOH: Neg,  beta hCG: 6.4,  Resp Panel: Neg 5/29- Urine Preg: Neg,  A1c: 5.3,  TSH: 1.719,  Lipid Panel: WNL except LDL: 101 5/24- BMP: WNL except Cr: 1.01,   Ca: 8.4,  hCG: 5,  Urine Preg: Neg 5/28- Ammonia: 32,  CBC: WNL,  CMP: WNL except  Cr: 1.08,  Ca: 8.6,  Depakote lvl: 20 6/3- Depakote lvl: 64, Hep Function: WNL,  CBC: WNL except Hem: 11.9 6/7- Depakote lvl: 75 6/18- UA: Neg,  CBC: WNL,  Depakote lvl: 136,  Ammonia: 99,  BMP: WNL except BUN: 22,  Cr: 1.19,  Ca: 8.6,  GFR: 54,  Hep Function: WNL except Albu: 3.3,  Indirect Bili: 0.2 6/20- ANA: Neg,  Ammonia: 48,  Ceruloplasmin: 24.2,  CMP: WNL except BUN: 23,  Cr: 1.35,  Ca: 8.3,  Albu: 3.4  HIV:Neg,  RPR: Neg,  Sed Rate: 6,  Depakote lvl: 25,  Heavy Metal: Neg 6/23- Ammonia: 46,  BMP: WNL except Cr: 1.2,  Ca: 8.3,  GFR: 54 6/24- UA: WNL except Bacteria: rare 6/30- CMP:  WNL except Cr: 1.04,  Ca: 8.6,  CBC: WNL,  Ammonia: 41, EKG: NSR with Qtc: 399 7/8 - CMP WNL other than CR 1.33 and GFR 48; Ammonia 30  Kai Levins, NP 10/29/2021, 12:07 PMPatient ID: Manuella Ghazi, female   DOB: 1965/09/27, 56 y.o.   MRN: [id number] Patient ID: CATHREN SWEEN, female   DOB: 1965-10-11, 56 y.o.   MRN: [id number] Patient ID: STEPHANYE FINNICUM, female   DOB: February 26, 1966, 57 y.o.   MRN: [id number] Patient ID: TRIANA COOVER, female   DOB: 06-Jan-1966, 56 y.o.   MRN: [id number] Patient ID: JENNIFR GAETA, female   DOB: 1965-05-23, 56 y.o.   MRN: [id number]

## 2021-10-29 NOTE — BH IP Treatment Plan (Signed)
Interdisciplinary Treatment and Diagnostic Plan Update  10/29/2021 Time of Session: 0830 Natalie Brown MRN: [id number]  Principal Diagnosis: Bipolar affective disorder, current episode manic with psychotic symptoms (HCC)  Secondary Diagnoses: Principal Problem:   Bipolar affective disorder, current episode manic with psychotic symptoms (HCC) Active Problems:   Increased ammonia level   Valproic acid causing adverse effect in therapeutic use   Altered mental status   Current Medications:  Current Facility-Administered Medications  Medication Dose Route Frequency Provider Last Rate Last Admin   acetaminophen (TYLENOL) tablet 650 mg  650 mg Oral Q6H PRN Lauro Franklin, MD   650 mg at 10/28/21 0757   alum & mag hydroxide-simeth (MAALOX/MYLANTA) 200-200-20 MG/5ML suspension 30 mL  30 mL Oral Q4H PRN Massengill, Harrold Donath, MD       benztropine (COGENTIN) tablet 0.5 mg  0.5 mg Oral BID Karsten Ro, MD   0.5 mg at 10/29/21 0034   feeding supplement (KATE FARMS STANDARD 1.4) liquid 325 mL  325 mL Oral BID BM Massengill, Harrold Donath, MD   325 mL at 10/29/21 0821   fluticasone (FLONASE) 50 MCG/ACT nasal spray 1 spray  1 spray Each Nare Daily Massengill, Harrold Donath, MD   1 spray at 10/29/21 9179   haloperidol (HALDOL) tablet 10 mg  10 mg Oral QHS Bartholomew Crews E, MD   10 mg at 10/28/21 2102   haloperidol (HALDOL) tablet 5 mg  5 mg Oral Daily Comer Locket, MD   5 mg at 10/29/21 1505   hydrocerin (EUCERIN) cream   Topical BID Phineas Inches, MD   Given at 10/29/21 6979   hydrOXYzine (ATARAX) tablet 25 mg  25 mg Oral TID PRN Phineas Inches, MD   25 mg at 10/23/21 0802   LORazepam (ATIVAN) tablet 1 mg  1 mg Oral Q6H PRN Bartholomew Crews E, MD   1 mg at 10/07/21 1546   magnesium hydroxide (MILK OF MAGNESIA) suspension 30 mL  30 mL Oral Daily PRN Massengill, Harrold Donath, MD       nicotine polacrilex (NICORETTE) gum 2 mg  2 mg Oral PRN Abbott Pao, Nadir, MD   2 mg at 10/27/21 1940   OLANZapine zydis  (ZYPREXA) disintegrating tablet 5 mg  5 mg Oral Q8H PRN Bartholomew Crews E, MD   5 mg at 10/14/21 1416   And   ziprasidone (GEODON) injection 20 mg  20 mg Intramuscular PRN Comer Locket, MD       PTA Medications: Medications Prior to Admission  Medication Sig Dispense Refill Last Dose   CLARITIN 10 MG tablet Take 10 mg by mouth daily.      fluticasone (FLONASE) 50 MCG/ACT nasal spray Place 1 spray into both nostrils daily.      pseudoephedrine-guaifenesin (MUCINEX D) 60-600 MG 12 hr tablet Take 1 tablet by mouth every 12 (twelve) hours. 30 tablet 0     Patient Stressors: Health problems   Medication change or noncompliance    Patient Strengths: Capable of independent living  Printmaker for treatment/growth  Supportive family/friends   Treatment Modalities: Medication Management, Group therapy, Case management,  1 to 1 session with clinician, Psychoeducation, Recreational therapy.   Physician Treatment Plan for Primary Diagnosis: Bipolar affective disorder, current episode manic with psychotic symptoms (HCC) Long Term Goal(s): Improvement in symptoms so as ready for discharge   Short Term Goals: Ability to identify and develop effective coping behaviors will improve Ability to maintain clinical measurements within normal limits will improve Compliance with prescribed medications will  improve Ability to identify changes in lifestyle to reduce recurrence of condition will improve Ability to verbalize feelings will improve Ability to disclose and discuss suicidal ideas Ability to demonstrate self-control will improve  Medication Management: Evaluate patient's response, side effects, and tolerance of medication regimen.  Therapeutic Interventions: 1 to 1 sessions, Unit Group sessions and Medication administration.  Evaluation of Outcomes: Not Progressing  Physician Treatment Plan for Secondary Diagnosis: Principal Problem:   Bipolar affective disorder,  current episode manic with psychotic symptoms (HCC) Active Problems:   Increased ammonia level   Valproic acid causing adverse effect in therapeutic use   Altered mental status  Long Term Goal(s): Improvement in symptoms so as ready for discharge   Short Term Goals: Ability to identify and develop effective coping behaviors will improve Ability to maintain clinical measurements within normal limits will improve Compliance with prescribed medications will improve Ability to identify changes in lifestyle to reduce recurrence of condition will improve Ability to verbalize feelings will improve Ability to disclose and discuss suicidal ideas Ability to demonstrate self-control will improve     Medication Management: Evaluate patient's response, side effects, and tolerance of medication regimen.  Therapeutic Interventions: 1 to 1 sessions, Unit Group sessions and Medication administration.  Evaluation of Outcomes: Not Progressing   RN Treatment Plan for Primary Diagnosis: Bipolar affective disorder, current episode manic with psychotic symptoms (HCC) Long Term Goal(s): Knowledge of disease and therapeutic regimen to maintain health will improve  Short Term Goals: Ability to remain free from injury will improve, Ability to verbalize frustration and anger appropriately will improve, Ability to demonstrate self-control, Ability to participate in decision making will improve, Ability to verbalize feelings will improve, Ability to disclose and discuss suicidal ideas, Ability to identify and develop effective coping behaviors will improve, and Compliance with prescribed medications will improve  Medication Management: RN will administer medications as ordered by provider, will assess and evaluate patient's response and provide education to patient for prescribed medication. RN will report any adverse and/or side effects to prescribing provider.  Therapeutic Interventions: 1 on 1 counseling  sessions, Psychoeducation, Medication administration, Evaluate responses to treatment, Monitor vital signs and CBGs as ordered, Perform/monitor CIWA, COWS, AIMS and Fall Risk screenings as ordered, Perform wound care treatments as ordered.  Evaluation of Outcomes: Not Progressing   LCSW Treatment Plan for Primary Diagnosis: Bipolar affective disorder, current episode manic with psychotic symptoms (HCC) Long Term Goal(s): Safe transition to appropriate next level of care at discharge, Engage patient in therapeutic group addressing interpersonal concerns.  Short Term Goals: Engage patient in aftercare planning with referrals and resources, Increase social support, Increase ability to appropriately verbalize feelings, Increase emotional regulation, Facilitate acceptance of mental health diagnosis and concerns, Facilitate patient progression through stages of change regarding substance use diagnoses and concerns, Identify triggers associated with mental health/substance abuse issues, and Increase skills for wellness and recovery  Therapeutic Interventions: Assess for all discharge needs, 1 to 1 time with Social worker, Explore available resources and support systems, Assess for adequacy in community support network, Educate family and significant other(s) on suicide prevention, Complete Psychosocial Assessment, Interpersonal group therapy.  Evaluation of Outcomes: Not Progressing   Progress in Treatment: Attending groups: Yes. Participating in groups: Yes. Taking medication as prescribed: Yes. Toleration medication: Yes. Family/Significant other contact made: Yes, individual(s) contacted:  SPE completed with Tamala Ser, mother.  Patient understands diagnosis: Yes. Discussing patient identified problems/goals with staff: Yes. Medical problems stabilized or resolved: Yes. Denies suicidal/homicidal ideation: Yes. Issues/concerns  per patient self-inventory: Yes. Other: none  New problem(s)  identified: No, Describe:  none  New Short Term/Long Term Goal(s): Patient to work towards elimination of symptoms of psychosis, medication management for mood stabilization; development of comprehensive mental wellness plan.  Patient Goals: No additional goals identified at this time. Patient to continue to work towards original goals identified in initial treatment team meeting. CSW will remain available to patient should they voice additional treatment goals.   Discharge Plan or Barriers: Patient continues to lack adequate housing and supervision given baseline confusion, disorganized thought/behaviors, and mania. Medicaid, disability, and guardianship in process; awaiting pending applications.   Reason for Continuation of Hospitalization: Other; describe altered mental status.   Estimated Length of Stay: TBD    Scribe for Treatment Team: Almedia Balls 10/29/2021 9:34 AM

## 2021-10-29 NOTE — Progress Notes (Signed)
   10/29/21 1300  Psych Admission Type (Psych Patients Only)  Admission Status Involuntary  Psychosocial Assessment  Patient Complaints Confusion  Eye Contact Fair  Facial Expression Anxious;Worried  Affect Preoccupied  Astronomer Activity Slow  Appearance/Hygiene Disheveled  Behavior Characteristics Cooperative  Mood Preoccupied  Thought Process  Coherency Circumstantial;Disorganized  Content Preoccupation  Delusions None reported or observed  Perception WDL  Hallucination None reported or observed  Judgment Impaired  Confusion Mild  Danger to Self  Current suicidal ideation? Denies  Danger to Others  Danger to Others None reported or observed

## 2021-10-29 NOTE — Group Note (Signed)
Recreation Therapy Group Note   Group Topic:Coping Skills  Group Date: 10/29/2021 Start Time: 0955 End Time: 1030 Facilitators: Caroll Rancher, LRT,CTRS Location: [address] Dayroom   Goal Area(s) Addresses:  Patient will identify positive coping skills. Patient will identify benefits of using coping skills post d/c.  Group Description:  Mind Map.  Patient was provided a blank template of a diagram with 32 blank boxes in a tiered system, branching from the center (similar to a bubble chart). LRT directed patients to label the middle of the diagram "Coping Skills".  LRT and patients then identified 8 different challenges (anxiety, depression, setting boundaries, relationships, attitude, self esteem, self respect and communication) in which coping skills could be used.  Patients were then given 20 minutes the come up with 3 coping skills for each challenge identified.  LRT would then write the coping skills the patients came up with on the board so patients could fill in any blank spots they may have had on their sheets.   Affect/Mood: Flat   Participation Level: Moderate   Participation Quality: Moderate Cues   Behavior: Cooperative   Speech/Thought Process: Disorganized   Insight: Lacking   Judgement: Lacking    Modes of Intervention: Worksheet   Patient Response to Interventions:  Attentive and Receptive   Education Outcome:  Acknowledges education and In group clarification offered    Clinical Observations/Individualized Feedback: Pt came in late to group.  Pt needed assistance filling in the first part of the worksheet.  Pt seemed to struggle understanding what the activity entailed.  At times, pt would just stare at the board.  Pt even walked up to the board and stood in front of it for awhile.  Pt was appropriate and attentive.     Plan: Continue to engage patient in RT group sessions 2-3x/week.   Caroll Rancher, Antonietta Jewel 10/29/2021 12:56 PM

## 2021-10-30 DIAGNOSIS — F312 Bipolar disorder, current episode manic severe with psychotic features: Secondary | ICD-10-CM | POA: Diagnosis not present

## 2021-10-30 NOTE — Progress Notes (Signed)
[facility] MD Progress Note  10/30/2021 4:57 PM Natalie Brown  MRN:  [id number]  Reason For Admission: Natalie Brown is a 56 yr old female who presented on 5/17 to [facility] with complaints of anxiety and feeling overwhelmed and SI without a plan, she was admitted to [facility] on 5/19.  PPHx is significant for possible Bipolar Disorder.  24 hr chart review: As per review of pt's chart, she has been medication compliant, and has not required a PRN medication the past 24 hrs. Her HR earlier today morning was slightly elevated at 102, and BP was WNL. As per flow sheets, she slept a total 68.75 hrs last night. As per nursing documentation, over the past 24 hrs, she continues to be preoccupied, suspicious, confused, disorganized & anxious.  Today's patient assessment note: On assessment today, pt presents with a euthymic mood, and affect is congruent. Attention to personal hygiene and grooming is poor, and staff continues to be met with resistance when positive reinforcements are provided for pt to take off a visibly dirty jacket that she wears so that staff can launder it. Eye contact is good, speech is clear & coherent. Thoughts remain unorganized with some illogical contents. She remains circumstantial and presents with flight of ideas in response to questions being asked.  She currently denies SI/HI/AVH. She denies paranoia today, but is suspicious of staff whenever attempts to draw blood for tests are made.  Pt continues to report a good sleep quality & continues to reports a good appetite, and denies any medication related side effects. There continues to be no abnormal movements of any of her extremities, and there is no stiffness or any other EPS related symptoms. AIMS: 0. Will continue medications as listed below. CR was 1.33 with a GFR of 48 on 7/8. Will reorder a repeat CMP to check CR tomorrow. Pt is being given positive reinforcements to drink fluids. Social work continues to coordinate for pt to get disability  income so that she can have funding for placement in a structured environment such as a group home since mother is unable to house her at this time.  Principal Problem: Bipolar affective disorder, current episode manic with psychotic symptoms ([place]) Diagnosis: Principal Problem:   Bipolar affective disorder, current episode manic with psychotic symptoms ([place]) Active Problems:   Increased ammonia level   Valproic acid causing adverse effect in therapeutic use   Altered mental status  Total Time spent with patient:  I personally spent 30 minutes on the unit in direct patient care. The direct patient care time included face-to-face time with the patient, reviewing the patient's chart, communicating with other professionals, and coordinating care. Greater than 50% of this time was spent in counseling or coordinating care with the patient regarding goals of hospitalization, psycho-education, and discharge planning needs.  Past Psychiatric History: Probable bipolar disorder  Past Medical History:  Past Medical History:  Diagnosis Date   Bipolar 1 disorder with moderate mania ([place]) 12/30/2013   Hypertension    History reviewed. No pertinent surgical history. Family History: History reviewed. No pertinent family history. Family Psychiatric  History: Patient reports mental illness runs in her father's side of the family Social History:  Social History   Substance and Sexual Activity  Alcohol Use No     Social History   Substance and Sexual Activity  Drug Use No    Social History   Socioeconomic History   Marital status: Married    Spouse name: Not on file   Number  of children: Not on file   Years of education: Not on file   Highest education level: Not on file  Occupational History   Not on file  Tobacco Use   Smoking status: Every Day   Smokeless tobacco: Current  Substance and Sexual Activity   Alcohol use: No   Drug use: No   Sexual activity: Not on file  Other Topics  Concern   Not on file  Social History Narrative   Not on file   Social Determinants of Health   Financial Resource Strain: Not on file  Food Insecurity: Not on file  Transportation Needs: Not on file  Physical Activity: Not on file  Stress: Not on file  Social Connections: Not on file   Additional Social History:   Sleep: Good  Appetite:  Good  Current Medications: Current Facility-Administered Medications  Medication Dose Route Frequency Provider Last Rate Last Admin   acetaminophen (TYLENOL) tablet 650 mg  650 mg Oral Q6H PRN Briant Cedar, MD   650 mg at 10/28/21 0757   alum & mag hydroxide-simeth (MAALOX/MYLANTA) 200-200-20 MG/5ML suspension 30 mL  30 mL Oral Q4H PRN Massengill, [name], MD       benztropine (COGENTIN) tablet 0.5 mg  0.5 mg Oral BID Armando Reichert, MD   0.5 mg at 10/30/21 0843   feeding supplement (KATE FARMS STANDARD 1.4) liquid 325 mL  325 mL Oral BID BM Massengill, [name], MD   325 mL at 10/29/21 0821   fluticasone (FLONASE) 50 MCG/ACT nasal spray 1 spray  1 spray Each Nare Daily Massengill, [name], MD   1 spray at 10/30/21 0842   haloperidol (HALDOL) tablet 10 mg  10 mg Oral QHS Viann Fish E, MD   10 mg at 10/29/21 2104   haloperidol (HALDOL) tablet 5 mg  5 mg Oral Daily Harlow Asa, MD   5 mg at 10/30/21 7124   hydrocerin (EUCERIN) cream   Topical BID Janine Limbo, MD   Given at 10/30/21 0843   hydrOXYzine (ATARAX) tablet 25 mg  25 mg Oral TID PRN Janine Limbo, MD   25 mg at 10/23/21 0802   LORazepam (ATIVAN) tablet 1 mg  1 mg Oral Q6H PRN Viann Fish E, MD   1 mg at 10/07/21 1546   magnesium hydroxide (MILK OF MAGNESIA) suspension 30 mL  30 mL Oral Daily PRN Massengill, [name], MD       nicotine polacrilex (NICORETTE) gum 2 mg  2 mg Oral PRN Winfred Leeds, Nadir, MD   2 mg at 10/27/21 1940   OLANZapine zydis (ZYPREXA) disintegrating tablet 5 mg  5 mg Oral Q8H PRN Viann Fish E, MD   5 mg at 10/14/21 1416   And   ziprasidone  (GEODON) injection 20 mg  20 mg Intramuscular PRN Harlow Asa, MD       Lab Results:  No results found for this or any previous visit (from the past 62 hour(s)).   Blood Alcohol level:  Lab Results  Component Value Date   ETH <10 58/12/9831   Metabolic Disorder Labs: Lab Results  Component Value Date   HGBA1C 5.3 09/08/2021   MPG 105.41 09/08/2021   No results found for: "PROLACTIN" Lab Results  Component Value Date   CHOL 173 09/08/2021   TRIG 110 09/08/2021   HDL 50 09/08/2021   CHOLHDL 3.5 09/08/2021   VLDL 22 09/08/2021   LDLCALC 101 (H) 09/08/2021   LDLCALC 102 (H) 06/21/2020   Physical Findings:  AIMS:  Facial and Oral Movements Muscles of Facial Expression: None, normal Lips and Perioral Area: None, normal Jaw: None, normal Tongue: None, normal,Extremity Movements Upper (arms, wrists, hands, fingers): None, normal Lower (legs, knees, ankles, toes): None, normal, Trunk Movements Neck, shoulders, hips: None, normal, Overall Severity Severity of abnormal movements (highest score from questions above): None, normal Incapacitation due to abnormal movements: None, normal Patient's awareness of abnormal movements (rate only patient's report): No Awareness, Dental Status Current problems with teeth and/or dentures?: No Does patient usually wear dentures?: No  No Cogwheeling or Rigidity Present.  Musculoskeletal: Strength & Muscle Tone: within normal limits Gait & Station: normal Patient leans: N/A  Psychiatric Specialty Exam:  Presentation  General Appearance: unkempt appearing   Eye Contact:Good  Speech:rambling but normal fluency  Speech Volume:Normal  Mood and Affect  Mood: appears anxious and at times irritable when discussing ADLs and clothes washing  Affect:anxious  Thought Process  Thought Processes: perseverative and ruminative  Orientation:Partial  Thought Content: Continues to have baseline, fixed delusions but does not mention these  unless prompted with questions; is not grossly responding to internal/external stimuli on exam; denies SI, HI, AVH or paranoia and denies ideas of reference or first rank symptoms but is guarded and perseverative about need for continued admission   Hallucinations:Hallucinations: None  Ideas of Reference:Denied  Suicidal Thoughts:Suicidal Thoughts: No  Homicidal Thoughts:Homicidal Thoughts: No  Sensorium  Memory:Limited   Judgment:Poor  Insight:Poor  Executive Functions  Concentration:Fair  Attention Span:Fair  Recall:Poor  Fund of Knowledge:Limited  Language:Fair  Psychomotor Activity  Psychomotor Activity:Psychomotor Activity: Normal  Assets  Assets:Resilience  Sleep  6.25 hours  Physical Exam Vitals and nursing note reviewed.  Constitutional:      General: [name] not in acute distress.    Appearance: Normal appearance. [name] normal weight. [name] not ill-appearing or toxic-appearing.  HENT:     Head: Normocephalic and atraumatic.  Pulmonary:     Effort: Pulmonary effort is normal.  Musculoskeletal:        General: Normal range of motion.  Neurological:     General: No focal deficit present.     Mental Status: [name] alert.     Sensory: No sensory deficit.     Coordination: Coordination normal.  Psychiatric:        Behavior: Behavior normal.    Review of Systems  Respiratory:  Negative for cough and shortness of breath.   Cardiovascular:  Negative for chest pain.  Gastrointestinal:  Negative for abdominal pain, constipation, diarrhea, nausea and vomiting.  Neurological:  Positive for headaches (She took some tylenol. Mild). Negative for dizziness and weakness.  Psychiatric/Behavioral:  Positive for memory loss. Negative for depression (She rated her Depression as 5/10 on a scale of 1-10. 10 being the worst depression.), substance abuse and suicidal ideas. Hallucinations: paranoia.The patient is not nervous/anxious and does not have insomnia.    Blood  pressure 132/83, pulse (!) 105, temperature 98.5 F (36.9 C), temperature source Oral, resp. rate 16, height 5' 6"  (1.676 m), weight 72.6 kg, SpO2 100 %. Body mass index is 25.83 kg/m.  Treatment Plan Summary: Daily contact with patient to assess and evaluate symptoms and progress in treatment and Medication management  Safety and Monitoring: Involuntary admission to inpatient psychiatric unit for safety, stabilization and treatment Daily contact with patient to assess and evaluate symptoms and progress in treatment Patient's case to be discussed in multi-disciplinary team meeting Observation Level : q15 minute checks Vital signs: q12 hours Precautions: safety  Bipolar I MRE manic with psychotic features (r/o schizoaffective d/o - bipolar type)             -Continue Haldol 5 mg AM and 10 mg QHS for psychosis and mood stabilization              -Continue Cogentin 0.5 mg twice daily             -Invega Sustenna 234 mg IM given on 09-30-21 116m IM given 10/12/20 - due for next monthly IM injection on 11/09/21             -- Zyprexa/Geodon/ativan agitation protocol PRN            -Additional psychosis labs (HIV- , RPR-, ANA-, heavy metal WNL, ESR wnl, ceruloplasmin wnl and CT head shows Marked for age brain atrophy.  No acute or reversible finding.)   -Depakote, Ativan, and Zyprexa stopped    Hyperammonemia -resolved -Ammonia level 30 on 10/27/21 -Depakote was stopped    Mildly elevated creatinine  -Creatinine up to 1.33 -We will keep on trending as she will cooperate for labs and encouraging po intake    Season allergies -Continue Claritin 158mdaily PRN    Urinary frequency- resolved -repeat UA with rare bacteria, but other wise, WNL  Discharge Planning: Social work and case management to assist with discharge planning and identification of hospital follow-up needs prior to discharge Estimated LOS: 5-7 days Discharge Concerns: Need to establish a safety plan; Medication compliance  and effectiveness Discharge Goals: Return home with outpatient referrals for mental health follow-up including medication management/psychotherapy Labs On Admission- CMP: WNL except Cr: 1.1,  GFR: 60,  CBC: WNL,  UDS: Neg,  Troponin: 3,  EtOH: Neg,  beta hCG: 6.4,  Resp Panel: Neg 5/29- Urine Preg: Neg,  A1c: 5.3,  TSH: 1.719,  Lipid Panel: WNL except LDL: 101 5/24- BMP: WNL except Cr: 1.01,  Ca: 8.4,  hCG: 5,  Urine Preg: Neg 5/28- Ammonia: 32,  CBC: WNL,  CMP: WNL except  Cr: 1.08,  Ca: 8.6,  Depakote lvl: 20 6/3- Depakote lvl: 64, Hep Function: WNL,  CBC: WNL except Hem: 11.9 6/7- Depakote lvl: 75 6/18- UA: Neg,  CBC: WNL,  Depakote lvl: 136,  Ammonia: 99,  BMP: WNL except BUN: 22,  Cr: 1.19,  Ca: 8.6,  GFR: 54,  Hep Function: WNL except Albu: 3.3,  Indirect Bili: 0.2 6/20- ANA: Neg,  Ammonia: 48,  Ceruloplasmin: 24.2,  CMP: WNL except BUN: 23,  Cr: 1.35,  Ca: 8.3,  Albu: 3.4  HIV:Neg,  RPR: Neg,  Sed Rate: 6,  Depakote lvl: 25,  Heavy Metal: Neg 6/23- Ammonia: 46,  BMP: WNL except Cr: 1.2,  Ca: 8.3,  GFR: 54 6/24- UA: WNL except Bacteria: rare 6/30- CMP:  WNL except Cr: 1.04,  Ca: 8.6,  CBC: WNL,  Ammonia: 41, EKG: NSR with Qtc: 399 7/8 - CMP WNL other than CR 1.33 and GFR 48; Ammonia 30  Natalie RoughNP 10/30/2021, 4:57 PMPatient ID: AnManuella Ghazifemale   DOB: [id number].o.   MRN: [id number] ID: AnLAQUAN LUDDENfemale   DOB: 1/[id number].o.   MRN: [id number] ID: AnELVIN BANKERfemale   DOB: 1/[id number].o.

## 2021-10-30 NOTE — Progress Notes (Signed)
   10/30/21 2005  Psych Admission Type (Psych Patients Only)  Admission Status Involuntary  Psychosocial Assessment  Patient Complaints Confusion;Other (Comment) (forgetfulness)  Eye Contact Fair  Facial Expression Worried  Affect Preoccupied  Speech Soft;Slow  Interaction Assertive  Motor Activity Other (Comment) (wnl)  Appearance/Hygiene Layered clothes  Behavior Characteristics Cooperative  Mood Preoccupied  Thought Process  Coherency Disorganized  Content Preoccupation  Delusions None reported or observed  Perception WDL  Hallucination None reported or observed  Judgment Impaired  Confusion Mild  Danger to Self  Current suicidal ideation? Denies  Danger to Others  Danger to Others None reported or observed   Progress note   D: Pt seen in hallway. Pt denies SI, HI, AVH. Pt rates pain  0/10. Pt rates anxiety  0/10 and depression  5/10. Pt is pleasant and cooperative. Repeats previous statements as if she has forgotten what she just said. Preoccupied with a letter from her mother that was left for social work on 10/26/21. Pt also talking about a meeting about her SSDI. Note from social worker referencing this meeting that was set up and took place in person 10/25/21. Pt said she was supposed to be at court today. Pt denies any other complaints right now.   A: Pt provided support and encouragement. Pt given scheduled medication as prescribed. PRNs as appropriate. Takes medication without incident. Q15 min checks for safety.   R: Pt safe on the unit. Will continue to monitor.

## 2021-10-30 NOTE — Group Note (Signed)
Recreation Therapy Group Note   Group Topic:Self-Esteem  Group Date: 10/30/2021 Start Time: 1000 End Time: 1030 Facilitators: Caroll Rancher, LRT,CTRS Location: [address] Dayroom   Goal Area(s) Addresses:  Patient will be able to identify self esteem. Patient will successfully share why it is beneficial to positive self esteem.  Group Description:  Patients and LRT discussed the importance of self esteem and what influences how we see ourselves.  Patients then picked a picture of a blank face of their choosing.  Patients were instructed to draw and describe how they see themselves on the blank face.  On the back of the sheet, patients were to identify how other people perceive them.  Patients were given colored pencils, markers, and crayons to complete the assignment. Patients shared their completed assignment with each other. Writer and group reflected on how it's important to have positive thoughts about oneself because it can impact how you move in the world.   Affect/Mood: Appropriate   Participation Level: Active   Participation Quality: Independent   Behavior: Appropriate   Speech/Thought Process: Disorganized   Insight: Lacking   Judgement: Lacking    Modes of Intervention: Art   Patient Response to Interventions:  Engaged   Education Outcome:  Acknowledges education and In group clarification offered    Clinical Observations/Individualized Feedback: Pt was able to draw a picture of how she sees herself.  However, pt was unable to describe it and how it represents her to the group.   Plan: Continue to engage patient in RT group sessions 2-3x/week.   Caroll Rancher, Antonietta Jewel 10/30/2021 12:32 PM

## 2021-10-30 NOTE — Progress Notes (Signed)
   10/30/21 0900  Psych Admission Type (Psych Patients Only)  Admission Status Involuntary  Psychosocial Assessment  Patient Complaints Confusion  Eye Contact Fair  Facial Expression Worried  Affect Preoccupied  Speech Soft;Slow  Interaction Assertive  Motor Activity Slow  Appearance/Hygiene Layered clothes  Behavior Characteristics Cooperative  Mood Preoccupied  Thought Process  Coherency Disorganized  Content Preoccupation  Delusions None reported or observed  Perception WDL  Hallucination None reported or observed  Judgment Impaired  Confusion Mild  Danger to Self  Current suicidal ideation? Denies  Danger to Others  Danger to Others None reported or observed

## 2021-10-31 DIAGNOSIS — F312 Bipolar disorder, current episode manic severe with psychotic features: Secondary | ICD-10-CM | POA: Diagnosis not present

## 2021-10-31 NOTE — [facility] Group Notes (Signed)
Adult Psychoeducational Group Note  Date:  10/31/2021 Time:  9:09 AM  Group Topic/Focus:  Goals Group:   The focus of this group is to help patients establish daily goals to achieve during treatment and discuss how the patient can incorporate goal setting into their daily lives to aide in recovery.  Participation Level:  Did Not Attend    Donell Beers 10/31/2021, 9:09 AM

## 2021-10-31 NOTE — Group Note (Signed)
Recreation Therapy Group Note   Group Topic:Team Building  Group Date: 10/31/2021 Start Time: 1000 End Time: 1025 Facilitators: Caroll Rancher, LRT,CTRS Location: 500 Hall Dayroom   Goal Area(s) Addresses:  Patient will effectively work with peer towards shared goal.  Patient will identify skills used to make activity successful.  Patient will identify how skills used during activity can be applied to reach post d/c goals.    Group Description: [employer]. In teams of 5-6, patients were given 20 small craft pipe cleaners. Using the materials provided, patients were instructed to compete again the opposing team(s) to build the tallest free-standing structure from floor level. The activity was timed; difficulty increased by Clinical research associate as Production designer, theatre/television/film continued.  Systematically resources were removed with additional directions for example, placing one arm behind their back, working in silence, and shape stipulations. LRT facilitated post-activity discussion reviewing team processes and necessary communication skills involved in completion. Patients were encouraged to reflect how the skills utilized, or not utilized, in this activity can be incorporated to positively impact support systems post discharge.   Affect/Mood: N/A   Participation Level: Did not attend    Clinical Observations/Individualized Feedback:     Plan: Continue to engage patient in RT group sessions 2-3x/week.   Caroll Rancher, LRT,CTRS 10/31/2021 11:54 AM

## 2021-10-31 NOTE — Progress Notes (Signed)
   10/31/21 0602  Sleep  Number of Hours 7.5

## 2021-10-31 NOTE — Progress Notes (Signed)
   10/31/21 2050  Psych Admission Type (Psych Patients Only)  Admission Status Involuntary  Psychosocial Assessment  Patient Complaints Confusion;Anxiety  Eye Contact Fair  Facial Expression Anxious  Affect Preoccupied  Speech Soft  Interaction Assertive  Motor Activity Slow  Appearance/Hygiene Layered clothes  Behavior Characteristics Cooperative  Mood Preoccupied;Anxious  Thought Process  Coherency Disorganized  Content Preoccupation  Delusions None reported or observed  Perception WDL  Hallucination None reported or observed  Judgment Impaired  Confusion Mild  Danger to Self  Current suicidal ideation? Denies  Danger to Others  Danger to Others None reported or observed   Progress note   D: Pt seen at med window. Pt denies SI, HI, AVH. Pt rates pain  0/10. Pt rates anxiety  6/10 and depression  0/10. Pt does express some sadness about being in the hospital. "I came here on 5/20 and I am still here. I need to go tomorrow. I have to meet with someone about my SSI and disability. I'm going to leave some of my stuff here but I have to leave tomorrow." Pt is still confused about a lot of things. Pt did say one good thing about her day was that her younger sister came to visit. "The visit was fine. You know she has a special needs child." No other concerns noted.  A: Pt provided support and encouragement. Pt given scheduled medication as prescribed. PRNs as appropriate. Q15 min checks for safety.   R: Pt safe on the unit. Will continue to monitor.

## 2021-10-31 NOTE — Group Note (Signed)
  [facility] LCSW Group Therapy Note  Date/Time:  10/31/2021 1:00pm  Type of Therapy and Topic:  Group Therapy:  Self-Care after Hospitalization  Participation Level:  Minimal   Description of Group This process group involved patients discussing how they plan to take care of themselves in a better manner when they get home from the hospital.  The group started with patients listing one healthy and one unhealthy way they took care of themselves prior to hospitalization.  A discussion ensued about the differences in healthy and unhealthy coping skills.  Group members shared ideas about making changes when they return home so that they can stay well and in recovery.  The white board was used to list ideas so that patients can continue to see these ideas throughout the day.  Therapeutic Goals Patient will identify and describe one healthy and one unhealthy coping technique used prior to hospitalization Patient will participate in generating ideas about healthy self-care options when they return to the community Patients will be supportive of one another and receive said support from others Patient will identify one healthy self-care activity to add to his/her post-hospitalization life that can help in recovery  Summary of Patient Progress:  The patient expressed that prior to hospitalization some healthy self-care activity engaging in fun activities, she engaged in was talking to family and friends.  Patient's participation in group was minimal.     Therapeutic Modalities Brief Solution-Focused Therapy Motivational Interviewing Psychoeducation   Nolon Rod, LCSW 10/31/2021, 12:00pm

## 2021-10-31 NOTE — Progress Notes (Signed)
Adult Psychoeducational Group Note  Date:  10/31/2021 Time:  12:21 AM  Group Topic/Focus:  Wrap-Up Group:   The focus of this group is to help patients review their daily goal of treatment and discuss progress on daily workbooks.  Participation Level:  Active  Participation Quality:  Appropriate  Affect:  Appropriate  Cognitive:  Disorganized and Confused  Insight: Limited  Engagement in Group:  Limited  Modes of Intervention:  Discussion  Additional Comments:   Pt stated her goal for today was to focus on her treatment plan. Pt stated she accomplished her goal today. Pt stated she talked with her doctor and with her social worker about her care today. Pt rated her overall day a 10. Pt stated her mother coming for visitation tonight improved her overall day. Pt stated she felt better about herself tonight. Pt stated she was able to attend all meals. Pt stated she took all medications provided today. Pt stated her appetite was good today. Pt rated her sleep last night was good. Pt stated the goal tonight was to get some rest. Pt stated she had no physical pain tonight. Pt deny visual hallucinations and auditory issues tonight. Pt denies thoughts of harming herself or others. Pt stated she would alert staff if anything changed.  Felipa Furnace 10/31/2021, 12:21 AM

## 2021-10-31 NOTE — Progress Notes (Signed)
[facility] MD Progress Note  10/31/2021 4:26 PM SHAYLEEN EPPINGER  MRN:  [id number]  Reason For Admission: Natalie Brown is a 56 yr old female who presented on 5/17 to [facility] with complaints of anxiety and feeling overwhelmed and SI without a plan, she was admitted to [facility] on 5/19.  PPHx is significant for possible Bipolar Disorder.  24 hr chart review: As per review of pt's chart, she has been medication compliant, and has not required a PRN medication the past 24 hrs. Her Vital signs have been WNL for past 24 hrs. As per flow sheets, she slept a total 7.8 hrs last night. As per nursing documentation, over the past 24 hrs, she continues to be preoccupied, suspicious, confused, disorganized & anxious.  Today's patient assessment note: On assessment today, pt presents with a euthymic mood, and affect is congruent. Attention to personal hygiene continues to be poor, and pt continues to refuse assistance with tending to personal hygiene. Eye contact is good, speech is clear & coherent. Thoughts remain unorganized with some illogical contents. She remains circumstantial and continues to present with flight of ideas in response to questions being asked.  She currently denies SI/HI/AVH. [name] continuing to suspicious of staff whenever attempts to draw blood for tests are made. Repeat CMP to check CR was ordered, but pt continues to refuse blood draws at this time.  Pt reports a good sleep quality & continues to report a good appetite, and denies any medication related side effects. There continues to be no abnormal movements of any of her extremities, and there is no stiffness or any other EPS related symptoms. AIMS: 0. Will continue medications as listed below. Pt is being given positive reinforcements to drink fluids. Social work continues to coordinate for pt to get disability income so that she can have funding for placement in a structured environment such as a group home since mother is unable to house her at this  time. Date for guardianship hearing has been moved up to July as per CSW.  Principal Problem: Bipolar affective disorder, current episode manic with psychotic symptoms ([place]) Diagnosis: Principal Problem:   Bipolar affective disorder, current episode manic with psychotic symptoms ([place]) Active Problems:   Increased ammonia level   Valproic acid causing adverse effect in therapeutic use   Altered mental status  Total Time spent with patient:  I personally spent 30 minutes on the unit in direct patient care. The direct patient care time included face-to-face time with the patient, reviewing the patient's chart, communicating with other professionals, and coordinating care. Greater than 50% of this time was spent in counseling or coordinating care with the patient regarding goals of hospitalization, psycho-education, and discharge planning needs.  Past Psychiatric History: Probable bipolar disorder  Past Medical History:  Past Medical History:  Diagnosis Date   Bipolar 1 disorder with moderate mania ([place]) 12/30/2013   Hypertension    History reviewed. No pertinent surgical history. Family History: History reviewed. No pertinent family history. Family Psychiatric  History: Patient reports mental illness runs in her father's side of the family Social History:  Social History   Substance and Sexual Activity  Alcohol Use No     Social History   Substance and Sexual Activity  Drug Use No    Social History   Socioeconomic History   Marital status: Married    Spouse name: Not on file   Number of children: Not on file   Years of education: Not on file  Highest education level: Not on file  Occupational History   Not on file  Tobacco Use   Smoking status: Every Day   Smokeless tobacco: Current  Substance and Sexual Activity   Alcohol use: No   Drug use: No   Sexual activity: Not on file  Other Topics Concern   Not on file  Social History Narrative   Not on file   Social  Determinants of Health   Financial Resource Strain: Not on file  Food Insecurity: Not on file  Transportation Needs: Not on file  Physical Activity: Not on file  Stress: Not on file  Social Connections: Not on file   Additional Social History:   Sleep: Good  Appetite:  Good  Current Medications: Current Facility-Administered Medications  Medication Dose Route Frequency Provider Last Rate Last Admin   acetaminophen (TYLENOL) tablet 650 mg  650 mg Oral Q6H PRN Briant Cedar, MD   650 mg at 10/28/21 0757   alum & mag hydroxide-simeth (MAALOX/MYLANTA) 200-200-20 MG/5ML suspension 30 mL  30 mL Oral Q4H PRN Massengill, [name], MD       benztropine (COGENTIN) tablet 0.5 mg  0.5 mg Oral BID Armando Reichert, MD   0.5 mg at 10/31/21 0845   feeding supplement (KATE FARMS STANDARD 1.4) liquid 325 mL  325 mL Oral BID BM Massengill, Nathan, MD   325 mL at 10/31/21 1036   fluticasone (FLONASE) 50 MCG/ACT nasal spray 1 spray  1 spray Each Nare Daily Massengill, [name], MD   1 spray at 10/31/21 0845   haloperidol (HALDOL) tablet 10 mg  10 mg Oral QHS Nelda Marseille, Amy E, MD   10 mg at 10/30/21 2031   haloperidol (HALDOL) tablet 5 mg  5 mg Oral Daily Viann Fish E, MD   5 mg at 10/31/21 0845   hydrocerin (EUCERIN) cream   Topical BID Janine Limbo, MD   1 Application at 35/36/14 0846   hydrOXYzine (ATARAX) tablet 25 mg  25 mg Oral TID PRN Janine Limbo, MD   25 mg at 10/23/21 0802   LORazepam (ATIVAN) tablet 1 mg  1 mg Oral Q6H PRN Viann Fish E, MD   1 mg at 10/07/21 1546   magnesium hydroxide (MILK OF MAGNESIA) suspension 30 mL  30 mL Oral Daily PRN Massengill, [name], MD       nicotine polacrilex (NICORETTE) gum 2 mg  2 mg Oral PRN Winfred Leeds, Nadir, MD   2 mg at 10/27/21 1940   OLANZapine zydis (ZYPREXA) disintegrating tablet 5 mg  5 mg Oral Q8H PRN Viann Fish E, MD   5 mg at 10/14/21 1416   And   ziprasidone (GEODON) injection 20 mg  20 mg Intramuscular PRN Harlow Asa, MD        Lab Results:  No results found for this or any previous visit (from the past 56 hour(s)).   Blood Alcohol level:  Lab Results  Component Value Date   ETH <10 43/15/4008   Metabolic Disorder Labs: Lab Results  Component Value Date   HGBA1C 5.3 09/08/2021   MPG 105.41 09/08/2021   No results found for: "PROLACTIN" Lab Results  Component Value Date   CHOL 173 09/08/2021   TRIG 110 09/08/2021   HDL 50 09/08/2021   CHOLHDL 3.5 09/08/2021   VLDL 22 09/08/2021   LDLCALC 101 (H) 09/08/2021   LDLCALC 102 (H) 06/21/2020   Physical Findings:  AIMS: Facial and Oral Movements Muscles of Facial Expression: None, normal Lips and Perioral Area:  None, normal Jaw: None, normal Tongue: None, normal,Extremity Movements Upper (arms, wrists, hands, fingers): None, normal Lower (legs, knees, ankles, toes): None, normal, Trunk Movements Neck, shoulders, hips: None, normal, Overall Severity Severity of abnormal movements (highest score from questions above): None, normal Incapacitation due to abnormal movements: None, normal Patient's awareness of abnormal movements (rate only patient's report): No Awareness, Dental Status Current problems with teeth and/or dentures?: No Does patient usually wear dentures?: No  No Cogwheeling or Rigidity Present.  Musculoskeletal: Strength & Muscle Tone: within normal limits Gait & Station: normal Patient leans: N/A  Psychiatric Specialty Exam:  Presentation  General Appearance: unkempt appearing   Eye Contact:Good  Speech:rambling but normal fluency  Speech Volume:Normal  Mood and Affect  Mood: appears anxious and at times irritable when discussing ADLs and clothes washing  Affect:anxious  Thought Process  Thought Processes: perseverative and ruminative  Orientation:Partial  Thought Content: Continues to have baseline, fixed delusions but does not mention these unless prompted with questions; is not grossly responding to  internal/external stimuli on exam; denies SI, HI, AVH or paranoia and denies ideas of reference or first rank symptoms but is guarded and perseverative about need for continued admission   Hallucinations:Hallucinations: None  Ideas of Reference:Denied  Suicidal Thoughts:Suicidal Thoughts: No  Homicidal Thoughts:Homicidal Thoughts: No  Sensorium  Memory:Limited   Judgment:Poor  Insight:Poor  Executive Functions  Concentration:Fair  Attention Span:Fair  Recall:Poor  Fund of Knowledge:Limited  Language:Fair  Psychomotor Activity  Psychomotor Activity:Psychomotor Activity: Normal  Assets  Assets:Resilience  Sleep  6.25 hours  Physical Exam Vitals and nursing note reviewed.  Constitutional:      General: [name] not in acute distress.    Appearance: Normal appearance. [name] normal weight. [name] not ill-appearing or toxic-appearing.  HENT:     Head: Normocephalic and atraumatic.  Pulmonary:     Effort: Pulmonary effort is normal.  Musculoskeletal:        General: Normal range of motion.  Neurological:     General: No focal deficit present.     Mental Status: [name] alert.     Sensory: No sensory deficit.     Coordination: Coordination normal.  Psychiatric:        Behavior: Behavior normal.    Review of Systems  Respiratory:  Negative for cough and shortness of breath.   Cardiovascular:  Negative for chest pain.  Gastrointestinal:  Negative for abdominal pain, constipation, diarrhea, nausea and vomiting.  Neurological:  Positive for headaches (She took some tylenol. Mild). Negative for dizziness and weakness.  Psychiatric/Behavioral:  Positive for memory loss. Negative for depression (She rated her Depression as 5/10 on a scale of 1-10. 10 being the worst depression.), substance abuse and suicidal ideas. Hallucinations: paranoia.The patient is not nervous/anxious and does not have insomnia.    Blood pressure 107/65, pulse 98, temperature 97.8 F (36.6 C),  temperature source Oral, resp. rate 16, height _0  (1.676 m), weight 72.6 kg, SpO2 100 %. Body mass index is 25.83 kg/m.  Treatment Plan Summary: Daily contact with patient to assess and evaluate symptoms and progress in treatment and Medication management  Safety and Monitoring: Involuntary admission to inpatient psychiatric unit for safety, stabilization and treatment Daily contact with patient to assess and evaluate symptoms and progress in treatment Patient's case to be discussed in multi-disciplinary team meeting Observation Level : q15 minute checks Vital signs: q12 hours Precautions: safety   Bipolar I MRE manic with psychotic features (r/o schizoaffective d/o - bipolar type)             -  Continue Haldol 5 mg AM and 10 mg QHS for psychosis and mood stabilization              -Continue Cogentin 0.5 mg twice daily             -Invega Sustenna 234 mg IM given on 09-30-21 140m IM given 10/12/20 - due for next monthly IM injection on 11/09/21             -- Zyprexa/Geodon/ativan agitation protocol PRN            -Additional psychosis labs (HIV- , RPR-, ANA-, heavy metal WNL, ESR wnl, ceruloplasmin wnl and CT head shows Marked for age brain atrophy.  No acute or reversible finding.)   -Depakote, Ativan, and Zyprexa stopped    Hyperammonemia -resolved -Ammonia level 30 on 10/27/21 -Depakote was stopped    Mildly elevated creatinine  -Creatinine up to 1.33 -We will keep on trending as she will cooperate for labs and encouraging po intake    Season allergies -Continue Claritin 150mdaily PRN    Urinary frequency- resolved -repeat UA with rare bacteria, but other wise, WNL  Discharge Planning: Social work and case management to assist with discharge planning and identification of hospital follow-up needs prior to discharge Estimated LOS: 5-7 days Discharge Concerns: Need to establish a safety plan; Medication compliance and effectiveness Discharge Goals: Return home with  outpatient referrals for mental health follow-up including medication management/psychotherapy Labs On Admission- CMP: WNL except Cr: 1.1,  GFR: 60,  CBC: WNL,  UDS: Neg,  Troponin: 3,  EtOH: Neg,  beta hCG: 6.4,  Resp Panel: Neg 5/29- Urine Preg: Neg,  A1c: 5.3,  TSH: 1.719,  Lipid Panel: WNL except LDL: 101 5/24- BMP: WNL except Cr: 1.01,  Ca: 8.4,  hCG: 5,  Urine Preg: Neg 5/28- Ammonia: 32,  CBC: WNL,  CMP: WNL except  Cr: 1.08,  Ca: 8.6,  Depakote lvl: 20 6/3- Depakote lvl: 64, Hep Function: WNL,  CBC: WNL except Hem: 11.9 6/7- Depakote lvl: 75 6/18- UA: Neg,  CBC: WNL,  Depakote lvl: 136,  Ammonia: 99,  BMP: WNL except BUN: 22,  Cr: 1.19,  Ca: 8.6,  GFR: 54,  Hep Function: WNL except Albu: 3.3,  Indirect Bili: 0.2 6/20- ANA: Neg,  Ammonia: 48,  Ceruloplasmin: 24.2,  CMP: WNL except BUN: 23,  Cr: 1.35,  Ca: 8.3,  Albu: 3.4  HIV:Neg,  RPR: Neg,  Sed Rate: 6,  Depakote lvl: 25,  Heavy Metal: Neg 6/23- Ammonia: 46,  BMP: WNL except Cr: 1.2,  Ca: 8.3,  GFR: 54 6/24- UA: WNL except Bacteria: rare 6/30- CMP:  WNL except Cr: 1.04,  Ca: 8.6,  CBC: WNL,  Ammonia: 41, EKG: NSR with Qtc: 399 7/8 - CMP WNL other than CR 1.33 and GFR 48; Ammonia 30  DoNicholes RoughNP 10/31/2021, 4:26 PMPatient ID: AnManuella Ghazifemale   DOB: [birth year]/[id number].   MRN: [id number]

## 2021-11-01 DIAGNOSIS — F312 Bipolar disorder, current episode manic severe with psychotic features: Secondary | ICD-10-CM | POA: Diagnosis not present

## 2021-11-01 NOTE — Group Note (Signed)
Recreation Therapy Group Note   Group Topic:Leisure Education  Group Date: 11/01/2021 Start Time: 1000 End Time: 1030 Facilitators: Caroll Rancher, LRT,CTRS Location: [address] Dayroom   Goal Area(s) Addresses:  Patient will identify positive leisure activities for use post discharge. Patient will identify at least one positive benefit of participation in leisure activities.  Patient will work effectively work with peer in completing the activity.  Group Description:  Patients were give one rubber disk a piece and one extra for the group.  Patients were to work as a team using the discs to get from one end of the room to the other and back.  Patients were to accomplish this by stepping on the disc, if anyone stepped off, the group had to start over.  Once the patients completed the task, they had to create a game of their own using the same rubber discs.  Patients need to come up with the rules, age limit, number of players and how the game is to be played.     Affect/Mood: Anxious   Participation Level: Minimal   Participation Quality: Maximum Cues   Behavior: Distracted and Impulsive   Speech/Thought Process: Distracted and Delusional   Insight: Lacking   Judgement: Lacking    Modes of Intervention: Cooperative Play   Patient Response to Interventions:  Challenging    Education Outcome:  Acknowledges education and In group clarification offered    Clinical Observations/Individualized Feedback: Pt was unable to focus during group.  Pt couldn't follow instructions for the activity.  Instead of patient working with peers to use the disc to maneuver through the room, pt flung her disc across the room, making it impossible for her peers to use it as intended.  Pt was distracted by anyone who came onto the unit.  Pt would ask them if they were Tyler Aas, the NP.  Pt needed constant redirection to focus on the group but pt was unable to do so.    Plan: Continue to engage patient in  RT group sessions 2-3x/week.   Caroll Rancher, LRT,CTRS 11/01/2021 11:14 AM

## 2021-11-01 NOTE — Progress Notes (Signed)
Pt presents irritable, worried and demanding on interactions "I want to leave today. I need to be at the social security office, they don't need my mom, they don't need my sister. They need me there. I will go and come back for my stuff". Requires multiple verbal redirections to comply with care and unit routines. Denies SI, HI, AVH and pain "I'm done, I just want to go and I'm leaving today". Attended scheduled groups and off unit activities this shift. Remains medication compliant without adverse drug reactions. Pt concerns validated. However, placement remains a barrier to d/c. Q 15 minutes safety checks maintained without incident. All medications administered with verbal education and effects. Support, encouragement and reassurance provided to pt. Pt tolerates all PO intake well. Reports she slept well with good appetite.

## 2021-11-01 NOTE — Progress Notes (Signed)
   11/01/21 0546  Sleep  Number of Hours 8

## 2021-11-01 NOTE — [facility] Group Notes (Signed)
Adult Psychoeducational Group Note  Date:  11/01/2021 Time:  8:43 AM  Group Topic/Focus:  Goals Group:   The focus of this group is to help patients establish daily goals to achieve during treatment and discuss how the patient can incorporate goal setting into their daily lives to aide in recovery.  Participation Level:  Did Not Attend   Natalie Brown 11/01/2021, 8:43 AM

## 2021-11-01 NOTE — Progress Notes (Signed)
[facility] MD Progress Note  11/01/2021 6:06 PM Natalie Brown  MRN:  [id number]  Reason For Admission: Natalie Brown is a 56 yr old female who presented on 5/17 to [facility] with complaints of anxiety and feeling overwhelmed and SI without a plan, she was admitted to [facility] on 5/19.  PPHx is significant for possible Bipolar Disorder.  24 hr chart review: As per review of pt's chart, she has been medication compliant, and has not required a PRN medication the past 24 hrs. Her Vital signs have been WNL for past 24 hrs. As per flow sheets, she slept a total 8 hrs last night. As per nursing documentation, over the past 24 hrs, she continues to be preoccupied, suspicious, confused, disorganized & anxious.  Today's patient assessment note: On assessment today, pt presents with an anxious & irritable mood, and affect is congruent. Attention to personal hygiene continues to be poor, and pt continues to refuse assistance with tending to personal hygiene needs.Today, pt reports being "Very angry", and states that she wants to leave hospital to go to the social services department. [name] confused and not able to comprehend the rationale for continuous hospitalization. Rational for remaining hospitalized has been explained to pt multiple times. Eye contact is good, speech is clear & coherent. Thoughts remain unorganized with some illogical contents. She remains circumstantial and continues to present with flight of ideas in response to questions being asked.  She currently denies SI/HI/AVH. [name] continuing to suspicious of staff whenever attempts to draw blood for tests are made. Repeat CMP to check CR has been ordered, but pt continues to refuse blood draws at this time. [name] continuing to be provided with positive reinforcements to have blood drawn for labs.  Pt reports a good sleep quality & continues to report a good appetite, and denies any medication related side effects. There continues to be no abnormal movements of  any of her extremities, and there is no stiffness or any other EPS related symptoms. AIMS: 0. Will continue medications as listed below. Pt is being given positive reinforcements to drink fluids. Social work continues to coordinate for pt to get disability income so that she can have funding for placement in a structured environment such as a group home since mother is unable to house her at this time. Date for guardianship hearing has been moved from September up to July as per CSW.  Will continue all medications as listed below.  Principal Problem: Bipolar affective disorder, current episode manic with psychotic symptoms ([place]) Diagnosis: Principal Problem:   Bipolar affective disorder, current episode manic with psychotic symptoms ([place]) Active Problems:   Increased ammonia level   Valproic acid causing adverse effect in therapeutic use   Altered mental status  Total Time spent with patient:  I personally spent 30 minutes on the unit in direct patient care. The direct patient care time included face-to-face time with the patient, reviewing the patient's chart, communicating with other professionals, and coordinating care. Greater than 50% of this time was spent in counseling or coordinating care with the patient regarding goals of hospitalization, psycho-education, and discharge planning needs.  Past Psychiatric History: Probable bipolar disorder  Past Medical History:  Past Medical History:  Diagnosis Date   Bipolar 1 disorder with moderate mania ([place]) 12/30/2013   Hypertension    History reviewed. No pertinent surgical history. Family History: History reviewed. No pertinent family history. Family Psychiatric  History: Patient reports mental illness runs in her father's side of  the family Social History:  Social History   Substance and Sexual Activity  Alcohol Use No     Social History   Substance and Sexual Activity  Drug Use No    Social History   Socioeconomic History    Marital status: Married    Spouse name: Not on file   Number of children: Not on file   Years of education: Not on file   Highest education level: Not on file  Occupational History   Not on file  Tobacco Use   Smoking status: Every Day   Smokeless tobacco: Current  Substance and Sexual Activity   Alcohol use: No   Drug use: No   Sexual activity: Not on file  Other Topics Concern   Not on file  Social History Narrative   Not on file   Social Determinants of Health   Financial Resource Strain: Not on file  Food Insecurity: Not on file  Transportation Needs: Not on file  Physical Activity: Not on file  Stress: Not on file  Social Connections: Not on file   Additional Social History:   Sleep: Good  Appetite:  Good  Current Medications: Current Facility-Administered Medications  Medication Dose Route Frequency Provider Last Rate Last Admin   acetaminophen (TYLENOL) tablet 650 mg  650 mg Oral Q6H PRN Briant Cedar, MD   650 mg at 10/28/21 0757   alum & mag hydroxide-simeth (MAALOX/MYLANTA) 200-200-20 MG/5ML suspension 30 mL  30 mL Oral Q4H PRN Massengill, [name], MD       benztropine (COGENTIN) tablet 0.5 mg  0.5 mg Oral BID Armando Reichert, MD   0.5 mg at 11/01/21 0855   feeding supplement (KATE FARMS STANDARD 1.4) liquid 325 mL  325 mL Oral BID BM Massengill, Nathan, MD   325 mL at 11/01/21 1454   fluticasone (FLONASE) 50 MCG/ACT nasal spray 1 spray  1 spray Each Nare Daily Massengill, [name], MD   1 spray at 11/01/21 0855   haloperidol (HALDOL) tablet 10 mg  10 mg Oral QHS Nelda Marseille, Amy E, MD   10 mg at 10/31/21 2050   haloperidol (HALDOL) tablet 5 mg  5 mg Oral Daily Viann Fish E, MD   5 mg at 11/01/21 1607   hydrocerin (EUCERIN) cream   Topical BID Massengill, [name], MD   1 Application at 37/10/62 1655   hydrOXYzine (ATARAX) tablet 25 mg  25 mg Oral TID PRN Janine Limbo, MD   25 mg at 10/23/21 0802   LORazepam (ATIVAN) tablet 1 mg  1 mg Oral Q6H PRN  Viann Fish E, MD   1 mg at 10/07/21 1546   magnesium hydroxide (MILK OF MAGNESIA) suspension 30 mL  30 mL Oral Daily PRN Massengill, [name], MD       nicotine polacrilex (NICORETTE) gum 2 mg  2 mg Oral PRN Winfred Leeds, Nadir, MD   2 mg at 11/01/21 0859   OLANZapine zydis (ZYPREXA) disintegrating tablet 5 mg  5 mg Oral Q8H PRN Viann Fish E, MD   5 mg at 10/14/21 1416   And   ziprasidone (GEODON) injection 20 mg  20 mg Intramuscular PRN Harlow Asa, MD       Lab Results:  No results found for this or any previous visit (from the past 10 hour(s)).   Blood Alcohol level:  Lab Results  Component Value Date   ETH <10 69/48/5462   Metabolic Disorder Labs: Lab Results  Component Value Date   HGBA1C 5.3 09/08/2021  MPG 105.41 09/08/2021   No results found for: "PROLACTIN" Lab Results  Component Value Date   CHOL 173 09/08/2021   TRIG 110 09/08/2021   HDL 50 09/08/2021   CHOLHDL 3.5 09/08/2021   VLDL 22 09/08/2021   LDLCALC 101 (H) 09/08/2021   LDLCALC 102 (H) 06/21/2020   Physical Findings:  AIMS: Facial and Oral Movements Muscles of Facial Expression: None, normal Lips and Perioral Area: None, normal Jaw: None, normal Tongue: None, normal,Extremity Movements Upper (arms, wrists, hands, fingers): None, normal Lower (legs, knees, ankles, toes): None, normal, Trunk Movements Neck, shoulders, hips: None, normal, Overall Severity Severity of abnormal movements (highest score from questions above): None, normal Incapacitation due to abnormal movements: None, normal Patient's awareness of abnormal movements (rate only patient's report): No Awareness, Dental Status Current problems with teeth and/or dentures?: No Does patient usually wear dentures?: No  No Cogwheeling or Rigidity Present.  Musculoskeletal: Strength & Muscle Tone: within normal limits Gait & Station: normal Patient leans: N/A  Psychiatric Specialty Exam:  Presentation  General Appearance: unkempt  appearing   Eye Contact:Good  Speech:rambling but normal fluency  Speech Volume:Normal  Mood and Affect  Mood: appears anxious and at times irritable when discussing ADLs and clothes washing  Affect:anxious  Thought Process  Thought Processes: perseverative and ruminative  Orientation:Partial  Thought Content: Continues to have baseline, fixed delusions but does not mention these unless prompted with questions; is not grossly responding to internal/external stimuli on exam; denies SI, HI, AVH or paranoia and denies ideas of reference or first rank symptoms but is guarded and perseverative about need for continued admission   Hallucinations:Hallucinations: None  Ideas of Reference:Denied  Suicidal Thoughts:Suicidal Thoughts: No  Homicidal Thoughts:Homicidal Thoughts: No  Sensorium  Memory:Limited   Judgment:Poor  Insight:Poor  Executive Functions  Concentration:Poor  Attention Span:Fair  Recall:Poor  Fund of Knowledge:Limited  Language:Fair  Psychomotor Activity  Psychomotor Activity:Psychomotor Activity: Normal  Assets  Assets:Resilience  Sleep  6.25 hours  Physical Exam Vitals and nursing note reviewed.  Constitutional:      General: [name] not in acute distress.    Appearance: Normal appearance. [name] normal weight. [name] not ill-appearing or toxic-appearing.  HENT:     Head: Normocephalic and atraumatic.  Pulmonary:     Effort: Pulmonary effort is normal.  Musculoskeletal:        General: Normal range of motion.  Neurological:     General: No focal deficit present.     Mental Status: [name] alert.     Sensory: No sensory deficit.     Coordination: Coordination normal.  Psychiatric:        Behavior: Behavior normal.    Review of Systems  Respiratory:  Negative for cough and shortness of breath.   Cardiovascular:  Negative for chest pain.  Gastrointestinal:  Negative for abdominal pain, constipation, diarrhea, nausea and vomiting.   Neurological:  Positive for headaches (She took some tylenol. Mild). Negative for dizziness and weakness.  Psychiatric/Behavioral:  Positive for memory loss. Negative for depression (She rated her Depression as 5/10 on a scale of 1-10. 10 being the worst depression.), substance abuse and suicidal ideas. Hallucinations: paranoia.The patient is not nervous/anxious and does not have insomnia.    Blood pressure 123/75, pulse 92, temperature 98.2 F (36.8 C), temperature source Oral, resp. rate 16, height 5' 6"  (1.676 m), weight 72.6 kg, SpO2 100 %. Body mass index is 25.83 kg/m.  Treatment Plan Summary: Daily contact with patient to assess and  evaluate symptoms and progress in treatment and Medication management  Safety and Monitoring: Involuntary admission to inpatient psychiatric unit for safety, stabilization and treatment Daily contact with patient to assess and evaluate symptoms and progress in treatment Patient's case to be discussed in multi-disciplinary team meeting Observation Level : q15 minute checks Vital signs: q12 hours Precautions: safety   Bipolar I MRE manic with psychotic features (r/o schizoaffective d/o - bipolar type)             -Continue Haldol 5 mg AM and 10 mg QHS for psychosis and mood stabilization              -Continue Cogentin 0.5 mg twice daily             -Invega Sustenna 234 mg IM given on 09-30-21 174m IM given 10/12/20 - due for next monthly IM injection on 11/09/21             -- Zyprexa/Geodon/ativan agitation protocol PRN            -Additional psychosis labs (HIV- , RPR-, ANA-, heavy metal WNL, ESR wnl, ceruloplasmin wnl and CT head shows Marked for age brain atrophy.  No acute or reversible finding.)   -Depakote, Ativan, and Zyprexa stopped    Hyperammonemia -resolved -Ammonia level 30 on 10/27/21 -Depakote was stopped    Mildly elevated creatinine  -Creatinine up to 1.33 -We will keep on trending as she will cooperate for labs and encouraging po  intake    Season allergies -Continue Claritin 145mdaily PRN    Urinary frequency- resolved -repeat UA with rare bacteria, but other wise, WNL  Discharge Planning: Social work and case management to assist with discharge planning and identification of hospital follow-up needs prior to discharge Estimated LOS: 5-7 days Discharge Concerns: Need to establish a safety plan; Medication compliance and effectiveness Discharge Goals: Return home with outpatient referrals for mental health follow-up including medication management/psychotherapy Labs On Admission- CMP: WNL except Cr: 1.1,  GFR: 60,  CBC: WNL,  UDS: Neg,  Troponin: 3,  EtOH: Neg,  beta hCG: 6.4,  Resp Panel: Neg 5/29- Urine Preg: Neg,  A1c: 5.3,  TSH: 1.719,  Lipid Panel: WNL except LDL: 101 5/24- BMP: WNL except Cr: 1.01,  Ca: 8.4,  hCG: 5,  Urine Preg: Neg 5/28- Ammonia: 32,  CBC: WNL,  CMP: WNL except  Cr: 1.08,  Ca: 8.6,  Depakote lvl: 20 6/3- Depakote lvl: 64, Hep Function: WNL,  CBC: WNL except Hem: 11.9 6/7- Depakote lvl: 75 6/18- UA: Neg,  CBC: WNL,  Depakote lvl: 136,  Ammonia: 99,  BMP: WNL except BUN: 22,  Cr: 1.19,  Ca: 8.6,  GFR: 54,  Hep Function: WNL except Albu: 3.3,  Indirect Bili: 0.2 6/20- ANA: Neg,  Ammonia: 48,  Ceruloplasmin: 24.2,  CMP: WNL except BUN: 23,  Cr: 1.35,  Ca: 8.3,  Albu: 3.4  HIV:Neg,  RPR: Neg,  Sed Rate: 6,  Depakote lvl: 25,  Heavy Metal: Neg 6/23- Ammonia: 46,  BMP: WNL except Cr: 1.2,  Ca: 8.3,  GFR: 54 6/24- UA: WNL except Bacteria: rare 6/30- CMP:  WNL except Cr: 1.04,  Ca: 8.6,  CBC: WNL,  Ammonia: 41, EKG: NSR with Qtc: 399 7/8 - CMP WNL other than CR 1.33 and GFR 48; Ammonia 30  DoNicholes RoughNP 11/01/2021, 6:06 PMPatient ID: AnManuella Ghazifemale   DOB: [id number].   MRN: [id number] ID: AnManuella Ghazifemale   DOB:  1966/01/31, 56 y.o.   MRN: [id number]

## 2021-11-01 NOTE — [facility] Group Notes (Signed)
Adult Psychoeducational Group Note  Date:  11/01/2021 Time:  8:55 PM  Group Topic/Focus:  Wrap-Up Group:   The focus of this group is to help patients review their daily goal of treatment and discuss progress on daily workbooks.  Participation Level:  Active  Participation Quality:  Appropriate and Attentive  Affect:  Appropriate  Cognitive:  Alert and Appropriate  Insight: Appropriate and Good  Engagement in Group:  Engaged  Modes of Intervention:  Discussion and Education  Additional Comments:  Pt attended and participated in wrap up group this evening, Pt states that they had a rough day due to them not being able to D/C like they wanted. Pt feels that they are ready to D/C. Pt states that they have no further information regarding their D/C at this time.   Chrisandra Netters 11/01/2021, 8:55 PM

## 2021-11-01 NOTE — Group Note (Signed)
Occupational Therapy Group Note  Group Topic:Coping Skills  Group Date: 11/01/2021 Start Time: 1400 End Time: 1500 Facilitators: Ted Mcalpine, OT   Group Description: Group encouraged increased engagement and participation through discussion and activity focused on "Coping Ahead." Patients were split up into teams and selected a card from a stack of positive coping strategies. Patients were instructed to act out/charade the coping skill for other peers to guess and receive points for their team. Discussion followed with a focus on identifying additional positive coping strategies and patients shared how they were going to cope ahead over the weekend while continuing hospitalization stay.  Therapeutic Goal(s): Identify positive vs negative coping strategies. Identify coping skills to be used during hospitalization vs coping skills outside of hospital/at home Increase participation in therapeutic group environment and promote engagement in treatment   Participation Level: Minimal   Participation Quality: Minimal Cues   Behavior: Calm and Cooperative   Speech/Thought Process: Barely audible   Affect/Mood: Anxious   Insight: Impaired   Judgement: Impaired   Individualization: pt was active in their participation of group discussion/activity. New skills were identified  Modes of Intervention: Discussion and Education  Patient Response to Interventions:  Attentive and Engaged   Plan: Continue to engage patient in OT groups 2 - 3x/week.  11/01/2021  Ted Mcalpine, OT  Kerrin Champagne, OT

## 2021-11-01 NOTE — Progress Notes (Addendum)
[facility] LCSW Progress Note   11/01/2021    8:10 AM  Natalie Brown   [id number]   Type of Contact and Topic: Care Coordination   CSW spoke with [name], mother, who states the guardianship hearing for patient has been moved from 20 September to 19 July. CSW to follow up after hearing and update treatment team. Situation ongoing, CSW will continue to monitor and update note as more information becomes available.     CSW attempted to reach out to Onnie Boer, [phone]) disability assistance coordinator at the [facility], in order to find out if he had submitted applications for SSI and SSDI. Unable to reach him by phone, Left HIPAA compliant voicemail with contact information and callback request. CSW to make additional attempts to reach coordinator.   Signed:  [name], MSW, LCSWA, LCAS 11/01/2021 8:10 AM

## 2021-11-01 NOTE — Progress Notes (Signed)
   11/01/21 2005  Psych Admission Type (Psych Patients Only)  Admission Status Involuntary  Psychosocial Assessment  Patient Complaints Confusion  Eye Contact Fair  Facial Expression Animated  Affect Appropriate to circumstance  Speech Soft  Interaction Assertive  Motor Activity Slow  Appearance/Hygiene Layered clothes  Behavior Characteristics Cooperative  Mood Pleasant  Thought Process  Coherency Disorganized  Content Preoccupation  Delusions None reported or observed  Perception WDL  Hallucination None reported or observed  Judgment Impaired  Confusion Mild  Danger to Self  Current suicidal ideation? Denies  Danger to Others  Danger to Others None reported or observed   Progress note   D: Pt seen in hallway. Pt denies SI, HI, AVH. Pt rates pain  0/10. Pt rates anxiety  0/10 and depression  0/10. Pt is animated and pleasant. Pt still mild confusion and repeating herself. Pt states that her appetite is good and that she has been attending groups today. Pt does not mention wanting to leave as she did last night. No other concerns noted at this time.  A: Pt provided support and encouragement. Pt given scheduled medication as prescribed. PRNs as appropriate. Q15 min checks for safety.   R: Pt safe on the unit. Will continue to monitor.

## 2021-11-02 ENCOUNTER — Encounter (HOSPITAL_COMMUNITY): Payer: Self-pay

## 2021-11-02 DIAGNOSIS — F312 Bipolar disorder, current episode manic severe with psychotic features: Secondary | ICD-10-CM | POA: Diagnosis not present

## 2021-11-02 NOTE — BH IP Treatment Plan (Signed)
Interdisciplinary Treatment and Diagnostic Plan Update  11/02/2021 Time of Session: 0830 Natalie Brown MRN: [id number]  Principal Diagnosis: Bipolar affective disorder, current episode manic with psychotic symptoms (HCC)  Secondary Diagnoses: Principal Problem:   Bipolar affective disorder, current episode manic with psychotic symptoms (HCC) Active Problems:   Increased ammonia level   Valproic acid causing adverse effect in therapeutic use   Altered mental status   Current Medications:  Current Facility-Administered Medications  Medication Dose Route Frequency Provider Last Rate Last Admin   acetaminophen (TYLENOL) tablet 650 mg  650 mg Oral Q6H PRN Lauro Franklin, MD   650 mg at 10/28/21 0757   alum & mag hydroxide-simeth (MAALOX/MYLANTA) 200-200-20 MG/5ML suspension 30 mL  30 mL Oral Q4H PRN Massengill, Harrold Donath, MD       benztropine (COGENTIN) tablet 0.5 mg  0.5 mg Oral BID Karsten Ro, MD   0.5 mg at 11/02/21 0834   feeding supplement (KATE FARMS STANDARD 1.4) liquid 325 mL  325 mL Oral BID BM Massengill, Harrold Donath, MD   325 mL at 11/02/21 0834   fluticasone (FLONASE) 50 MCG/ACT nasal spray 1 spray  1 spray Each Nare Daily Massengill, Harrold Donath, MD   1 spray at 11/02/21 0834   haloperidol (HALDOL) tablet 10 mg  10 mg Oral QHS Bartholomew Crews E, MD   10 mg at 11/01/21 2054   haloperidol (HALDOL) tablet 5 mg  5 mg Oral Daily Comer Locket, MD   5 mg at 11/02/21 7829   hydrocerin (EUCERIN) cream   Topical BID Phineas Inches, MD   Given at 11/02/21 0834   hydrOXYzine (ATARAX) tablet 25 mg  25 mg Oral TID PRN Phineas Inches, MD   25 mg at 10/23/21 0802   LORazepam (ATIVAN) tablet 1 mg  1 mg Oral Q6H PRN Bartholomew Crews E, MD   1 mg at 10/07/21 1546   magnesium hydroxide (MILK OF MAGNESIA) suspension 30 mL  30 mL Oral Daily PRN Massengill, Harrold Donath, MD       nicotine polacrilex (NICORETTE) gum 2 mg  2 mg Oral PRN Abbott Pao, Nadir, MD   2 mg at 11/01/21 0859   OLANZapine zydis  (ZYPREXA) disintegrating tablet 5 mg  5 mg Oral Q8H PRN Bartholomew Crews E, MD   5 mg at 10/14/21 1416   And   ziprasidone (GEODON) injection 20 mg  20 mg Intramuscular PRN Comer Locket, MD       PTA Medications: Medications Prior to Admission  Medication Sig Dispense Refill Last Dose   CLARITIN 10 MG tablet Take 10 mg by mouth daily.      fluticasone (FLONASE) 50 MCG/ACT nasal spray Place 1 spray into both nostrils daily.      pseudoephedrine-guaifenesin (MUCINEX D) 60-600 MG 12 hr tablet Take 1 tablet by mouth every 12 (twelve) hours. 30 tablet 0     Patient Stressors: Health problems   Medication change or noncompliance    Patient Strengths: Capable of independent living  Printmaker for treatment/growth  Supportive family/friends   Treatment Modalities: Medication Management, Group therapy, Case management,  1 to 1 session with clinician, Psychoeducation, Recreational therapy.   Physician Treatment Plan for Primary Diagnosis: Bipolar affective disorder, current episode manic with psychotic symptoms (HCC) Long Term Goal(s): Improvement in symptoms so as ready for discharge   Short Term Goals: Ability to identify and develop effective coping behaviors will improve Ability to maintain clinical measurements within normal limits will improve Compliance with prescribed medications will  improve Ability to identify changes in lifestyle to reduce recurrence of condition will improve Ability to verbalize feelings will improve Ability to disclose and discuss suicidal ideas Ability to demonstrate self-control will improve  Medication Management: Evaluate patient's response, side effects, and tolerance of medication regimen.  Therapeutic Interventions: 1 to 1 sessions, Unit Group sessions and Medication administration.  Evaluation of Outcomes: Not Progressing  Physician Treatment Plan for Secondary Diagnosis: Principal Problem:   Bipolar affective disorder,  current episode manic with psychotic symptoms ([place]) Active Problems:   Increased ammonia level   Valproic acid causing adverse effect in therapeutic use   Altered mental status  Long Term Goal(s): Improvement in symptoms so as ready for discharge   Short Term Goals: Ability to identify and develop effective coping behaviors will improve Ability to maintain clinical measurements within normal limits will improve Compliance with prescribed medications will improve Ability to identify changes in lifestyle to reduce recurrence of condition will improve Ability to verbalize feelings will improve Ability to disclose and discuss suicidal ideas Ability to demonstrate self-control will improve     Medication Management: Evaluate patient's response, side effects, and tolerance of medication regimen.  Therapeutic Interventions: 1 to 1 sessions, Unit Group sessions and Medication administration.  Evaluation of Outcomes: Not Progressing   RN Treatment Plan for Primary Diagnosis: Bipolar affective disorder, current episode manic with psychotic symptoms ([place]) Long Term Goal(s): Knowledge of disease and therapeutic regimen to maintain health will improve  Short Term Goals: Ability to remain free from injury will improve, Ability to verbalize frustration and anger appropriately will improve, Ability to demonstrate self-control, Ability to participate in decision making will improve, Ability to verbalize feelings will improve, Ability to disclose and discuss suicidal ideas, Ability to identify and develop effective coping behaviors will improve, and Compliance with prescribed medications will improve  Medication Management: RN will administer medications as ordered by provider, will assess and evaluate patient's response and provide education to patient for prescribed medication. RN will report any adverse and/or side effects to prescribing provider.  Therapeutic Interventions: 1 on 1 counseling  sessions, Psychoeducation, Medication administration, Evaluate responses to treatment, Monitor vital signs and CBGs as ordered, Perform/monitor CIWA, COWS, AIMS and Fall Risk screenings as ordered, Perform wound care treatments as ordered.  Evaluation of Outcomes: Not Progressing   LCSW Treatment Plan for Primary Diagnosis: Bipolar affective disorder, current episode manic with psychotic symptoms ([place]) Long Term Goal(s): Safe transition to appropriate next level of care at discharge, Engage patient in therapeutic group addressing interpersonal concerns.  Short Term Goals: Engage patient in aftercare planning with referrals and resources, Increase social support, Increase ability to appropriately verbalize feelings, Increase emotional regulation, Facilitate acceptance of mental health diagnosis and concerns, Facilitate patient progression through stages of change regarding substance use diagnoses and concerns, Identify triggers associated with mental health/substance abuse issues, and Increase skills for wellness and recovery  Therapeutic Interventions: Assess for all discharge needs, 1 to 1 time with Social worker, Explore available resources and support systems, Assess for adequacy in community support network, Educate family and significant other(s) on suicide prevention, Complete Psychosocial Assessment, Interpersonal group therapy.  Evaluation of Outcomes: Not Progressing   Progress in Treatment: Attending groups: Yes. Participating in groups: Yes. Taking medication as prescribed: Yes. Toleration medication: Yes. Family/Significant other contact made: Yes, individual(s) contacted:  SPE completed with Truddie Hidden, mother.  Patient understands diagnosis: Yes. Discussing patient identified problems/goals with staff: Yes. Medical problems stabilized or resolved: Yes. Denies suicidal/homicidal ideation: Yes. Issues/concerns  per patient self-inventory: Yes. Other: none  New problem(s)  identified: No, Describe:  none  New Short Term/Long Term Goal(s): Patient to work towards elimination of symptoms of psychosis, medication management for mood stabilization; development of comprehensive mental wellness plan.  Patient Goals:  No additional goals identified at this time. Patient to continue to work towards original goals identified in initial treatment team meeting. CSW will remain available to patient should they voice additional treatment goals.   Discharge Plan or Barriers: Patient continues to lack adequate housing and supervision given baseline confusion, disorganized thought/behaviors, and mania. Medicaid, disability, and guardianship in process; awaiting pending applications.     Reason for Continuation of Hospitalization: Delusions , altered mental status  Estimated Length of Stay: TBD  Last 3 [place] Suicide Severity Risk Score: Flowsheet Row Ad[facility] (Current) [facility] from [facility]2021 in [facility] from [facility]/26/2020 in [facility]  C-SSRS RISK CATEGORY High Risk High Risk No Risk       Last PHQ 2/9 Scores:    05/26/2020    2:14 PM 09/03/2017    4:34 PM 08/07/2016    2:12 PM  Depression screen PHQ 2/9  Decreased Interest 3 3 3   Down, Depressed, Hopeless 3 1 2   PHQ - 2 Score 6 4 5   Altered sleeping 0 3 3  Tired, decreased energy 0 3 0  Change in appetite 3 3 0  Feeling bad or failure about yourself  0 3 1  Trouble concentrating 3 0 0  Moving slowly or fidgety/restless 3 0 1  Suicidal thoughts 0 0 0  PHQ-9 Score 15 16 10     Scribe for Treatment Team: 11/02/2021 12:42 PM

## 2021-11-02 NOTE — [facility] Group Notes (Signed)
[facility] Group Notes:  (Nursing/MHT/Case Management/Adjunct)  Date:  11/02/2021  Time:  11:02 AM  Type of Therapy:   Orientation/Goals group  Participation Level:  Active  Participation Quality:  Attentive and Intrusive  Affect:  Anxious, Irritable, and Not Congruent  Cognitive:  Confused and Delusional  Insight:  Improving  Engagement in Group:  Developing/Improving and Off Topic  Modes of Intervention:  Discussion, Education, Orientation, and Support  Summary of Progress/Problems: Pt goal for today is to gt to social services and talk to the doctor.   Dianna Ewald J Madinah Quarry 11/02/2021, 11:02 AM

## 2021-11-02 NOTE — Plan of Care (Signed)

## 2021-11-02 NOTE — Group Note (Signed)
Recreation Therapy Group Note   Group Topic:Other  Group Date: 11/02/2021 Start Time: 1000 End Time: 1040 Facilitators: Caroll Rancher, LRT,CTRS Location: [address] Dayroom   Goal Area(s) Addresses:  Patient will successfully identify positive attributes about themselves.  Patient will identify healthy ways to increase self-expression Patient will acknowledge benefit(s) of expressing self in a positive manner.   Group Description:  TOTIKA.  This game consists of blocks of different colors (orange, blue, red and green).  It is also played just like [place]  Patients will pull a block from the tower and stack it on top of the tower.  Whatever color is picked, LRT will ask patient a question that corresponds with that particular color.  Patients will continue to play the game as instructed until the tower falls down.  Once the tower falls, it will be rebuilt and patients will play another round.   Affect/Mood: N/A   Participation Level: Did not attend    Clinical Observations/Individualized Feedback:     Plan: Continue to engage patient in RT group sessions 2-3x/week.   Caroll Rancher, LRT,CTRS  11/02/2021 1:04 PM

## 2021-11-02 NOTE — Progress Notes (Signed)
   11/02/21 2000  Psych Admission Type (Psych Patients Only)  Admission Status Involuntary  Psychosocial Assessment  Patient Complaints Confusion  Eye Contact Fair  Facial Expression Animated;Anxious  Affect Appropriate to circumstance  Speech Soft  Interaction Assertive  Motor Activity Slow  Appearance/Hygiene Disheveled  Behavior Characteristics Cooperative  Mood Pleasant  Aggressive Behavior  Effect No apparent injury  Thought Process  Coherency Circumstantial;Disorganized  Content Preoccupation  Delusions None reported or observed  Perception WDL  Hallucination None reported or observed  Judgment Impaired  Confusion Mild  Danger to Self  Current suicidal ideation? Denies  Danger to Others  Danger to Others None reported or observed

## 2021-11-02 NOTE — Progress Notes (Signed)
   11/02/21 0539  Sleep  Number of Hours 6.75

## 2021-11-02 NOTE — [facility] Group Notes (Signed)
Wrap up group   Pt attended and contributed to group. 

## 2021-11-02 NOTE — Progress Notes (Signed)
[facility] MD Progress Note  11/02/2021 5:45 PM Natalie Brown  MRN:  [id number]  Reason For Admission: Natalie Brown is a 56 yr old female who presented on 5/17 to [facility] with complaints of anxiety and feeling overwhelmed and SI without a plan, she was admitted to [facility] on 5/19.  PPHx is significant for possible Bipolar Disorder.  24 hr chart review: As per review of pt's chart, she has been medication compliant, and has not required a PRN medication the past 24 hrs. Her Vital signs have been mostly WNL for past 24 hrs, with the exception of a slightly elevated HR at 103. As per flow sheets, she slept a total 6.75 hrs last night. As per nursing documentation, over the past 24 hrs, she continues to be preoccupied, suspicious, confused, disorganized & anxious.  Today's patient assessment note: On assessment today, pt presents with a euthymic mood & affect is congruent. Attention to personal hygiene remains poor, and pt continues to refuse assistance with tending to personal hygiene needs. Eye contact is good, speech is clear & coherent. Thoughts remain unorganized with some illogical contents. She remains circumstantial and continues to present with flight of ideas in response to questions being asked.  She currently denies SI/HI/AVH. [name] continuing to suspicious of staff. Repeat BMP ordered, due to CR of 1.33, but pt is continuing to refuse to comply with blood draws. Pt is being given positive reinforcements to drink fluids.   Pt reports a good sleep quality & continues to report a good appetite, and denies any medication related side effects. There continues to be no abnormal movements of any of her extremities, and there is no stiffness or any other EPS related symptoms. AIMS: 0. Will continue medications as listed below. Social work continues to coordinate for pt to get disability income so that she can have funding for placement in a structured environment such as a group home since mother is unable to  house her at this time. Date for guardianship hearing has been moved from September up to July as per CSW. Will continue all medications as listed below.  Principal Problem: Bipolar affective disorder, current episode manic with psychotic symptoms ([place]) Diagnosis: Principal Problem:   Bipolar affective disorder, current episode manic with psychotic symptoms ([place]) Active Problems:   Increased ammonia level   Valproic acid causing adverse effect in therapeutic use   Altered mental status  Total Time spent with patient:  I personally spent 30 minutes on the unit in direct patient care. The direct patient care time included face-to-face time with the patient, reviewing the patient's chart, communicating with other professionals, and coordinating care. Greater than 50% of this time was spent in counseling or coordinating care with the patient regarding goals of hospitalization, psycho-education, and discharge planning needs.  Past Psychiatric History: Probable bipolar disorder  Past Medical History:  Past Medical History:  Diagnosis Date   Bipolar 1 disorder with moderate mania ([place]) 12/30/2013   Hypertension    History reviewed. No pertinent surgical history. Family History: History reviewed. No pertinent family history. Family Psychiatric  History: Patient reports mental illness runs in her father's side of the family Social History:  Social History   Substance and Sexual Activity  Alcohol Use No     Social History   Substance and Sexual Activity  Drug Use No    Social History   Socioeconomic History   Marital status: Married    Spouse name: Not on file   Number of children:  Not on file   Years of education: Not on file   Highest education level: Not on file  Occupational History   Not on file  Tobacco Use   Smoking status: Every Day   Smokeless tobacco: Current  Substance and Sexual Activity   Alcohol use: No   Drug use: No   Sexual activity: Not on file  Other Topics  Concern   Not on file  Social History Narrative   Not on file   Social Determinants of Health   Financial Resource Strain: Not on file  Food Insecurity: Not on file  Transportation Needs: Not on file  Physical Activity: Not on file  Stress: Not on file  Social Connections: Not on file   Additional Social History:   Sleep: Good  Appetite:  Good  Current Medications: Current Facility-Administered Medications  Medication Dose Route Frequency Provider Last Rate Last Admin   acetaminophen (TYLENOL) tablet 650 mg  650 mg Oral Q6H PRN Briant Cedar, MD   650 mg at 10/28/21 0757   alum & mag hydroxide-simeth (MAALOX/MYLANTA) 200-200-20 MG/5ML suspension 30 mL  30 mL Oral Q4H PRN Massengill, [name], MD       benztropine (COGENTIN) tablet 0.5 mg  0.5 mg Oral BID Armando Reichert, MD   0.5 mg at 11/02/21 0834   feeding supplement (KATE FARMS STANDARD 1.4) liquid 325 mL  325 mL Oral BID BM Massengill, [name], MD   325 mL at 11/02/21 0834   fluticasone (FLONASE) 50 MCG/ACT nasal spray 1 spray  1 spray Each Nare Daily Massengill, [name], MD   1 spray at 11/02/21 0834   haloperidol (HALDOL) tablet 10 mg  10 mg Oral QHS Nelda Marseille, Amy E, MD   10 mg at 11/01/21 2054   haloperidol (HALDOL) tablet 5 mg  5 mg Oral Daily Harlow Asa, MD   5 mg at 11/02/21 6378   hydrocerin (EUCERIN) cream   Topical BID Janine Limbo, MD   Given at 11/02/21 0834   hydrOXYzine (ATARAX) tablet 25 mg  25 mg Oral TID PRN Janine Limbo, MD   25 mg at 10/23/21 0802   LORazepam (ATIVAN) tablet 1 mg  1 mg Oral Q6H PRN Viann Fish E, MD   1 mg at 10/07/21 1546   magnesium hydroxide (MILK OF MAGNESIA) suspension 30 mL  30 mL Oral Daily PRN Massengill, [name], MD       nicotine polacrilex (NICORETTE) gum 2 mg  2 mg Oral PRN Winfred Leeds, Nadir, MD   2 mg at 11/01/21 0859   OLANZapine zydis (ZYPREXA) disintegrating tablet 5 mg  5 mg Oral Q8H PRN Viann Fish E, MD   5 mg at 10/14/21 1416   And   ziprasidone  (GEODON) injection 20 mg  20 mg Intramuscular PRN Harlow Asa, MD       Lab Results:  No results found for this or any previous visit (from the past 30 hour(s)).   Blood Alcohol level:  Lab Results  Component Value Date   ETH <10 58/85/0277   Metabolic Disorder Labs: Lab Results  Component Value Date   HGBA1C 5.3 09/08/2021   MPG 105.41 09/08/2021   No results found for: "PROLACTIN" Lab Results  Component Value Date   CHOL 173 09/08/2021   TRIG 110 09/08/2021   HDL 50 09/08/2021   CHOLHDL 3.5 09/08/2021   VLDL 22 09/08/2021   LDLCALC 101 (H) 09/08/2021   LDLCALC 102 (H) 06/21/2020   Physical Findings:  AIMS: Facial and  Oral Movements Muscles of Facial Expression: None, normal Lips and Perioral Area: None, normal Jaw: None, normal Tongue: None, normal,Extremity Movements Upper (arms, wrists, hands, fingers): None, normal Lower (legs, knees, ankles, toes): None, normal, Trunk Movements Neck, shoulders, hips: None, normal, Overall Severity Severity of abnormal movements (highest score from questions above): None, normal Incapacitation due to abnormal movements: None, normal Patient's awareness of abnormal movements (rate only patient's report): No Awareness, Dental Status Current problems with teeth and/or dentures?: No Does patient usually wear dentures?: No  No Cogwheeling or Rigidity Present.  Musculoskeletal: Strength & Muscle Tone: within normal limits Gait & Station: normal Patient leans: N/A  Psychiatric Specialty Exam:  Presentation  General Appearance: unkempt appearing   Eye Contact:Good  Speech:rambling but normal fluency  Speech Volume:Normal  Mood and Affect  Mood: appears anxious and at times irritable when discussing ADLs and clothes washing  Affect:anxious  Thought Process  Thought Processes: perseverative and ruminative  Orientation:Partial  Thought Content: Continues to have baseline, fixed delusions but does not mention these  unless prompted with questions; is not grossly responding to internal/external stimuli on exam; denies SI, HI, AVH or paranoia and denies ideas of reference or first rank symptoms but is guarded and perseverative about need for continued admission   Hallucinations:Hallucinations: None  Ideas of Reference:Denied  Suicidal Thoughts:Suicidal Thoughts: No  Homicidal Thoughts:Homicidal Thoughts: No  Sensorium  Memory:Limited   Judgment:Poor  Insight:Poor  Executive Functions  Concentration:Fair  Attention Span:Fair  Recall:Poor  Fund of Knowledge:Limited  Language:Fair  Psychomotor Activity  Psychomotor Activity:Psychomotor Activity: Normal  Assets  Assets:Resilience  Sleep  6.25 hours  Physical Exam Vitals and nursing note reviewed.  Constitutional:      General: [name] not in acute distress.    Appearance: Normal appearance. [name] normal weight. [name] not ill-appearing or toxic-appearing.  HENT:     Head: Normocephalic and atraumatic.  Pulmonary:     Effort: Pulmonary effort is normal.  Musculoskeletal:        General: Normal range of motion.  Neurological:     General: No focal deficit present.     Mental Status: [name] alert.     Sensory: No sensory deficit.     Coordination: Coordination normal.  Psychiatric:        Behavior: Behavior normal.    Review of Systems  Respiratory:  Negative for cough and shortness of breath.   Cardiovascular:  Negative for chest pain.  Gastrointestinal:  Negative for abdominal pain, constipation, diarrhea, nausea and vomiting.  Neurological:  Positive for headaches (She took some tylenol. Mild). Negative for dizziness and weakness.  Psychiatric/Behavioral:  Positive for memory loss. Negative for depression (She rated her Depression as 5/10 on a scale of 1-10. 10 being the worst depression.), substance abuse and suicidal ideas. Hallucinations: paranoia.The patient is not nervous/anxious and does not have insomnia.    Blood  pressure 123/79, pulse (!) 103, temperature 97.7 F (36.5 C), temperature source Oral, resp. rate 16, height 5' 6"  (1.676 m), weight 72.6 kg, SpO2 97 %. Body mass index is 25.83 kg/m.  Treatment Plan Summary: Daily contact with patient to assess and evaluate symptoms and progress in treatment and Medication management  Safety and Monitoring: Involuntary admission to inpatient psychiatric unit for safety, stabilization and treatment Daily contact with patient to assess and evaluate symptoms and progress in treatment Patient's case to be discussed in multi-disciplinary team meeting Observation Level : q15 minute checks Vital signs: q12 hours Precautions: safety  Bipolar I MRE manic with psychotic features (r/o schizoaffective d/o - bipolar type)             -Continue Haldol 5 mg AM and 10 mg QHS for psychosis and mood stabilization              -Continue Cogentin 0.5 mg twice daily             -Invega Sustenna 234 mg IM given on 09-30-21 148m IM given 10/12/20 - due for next monthly IM injection on 11/09/21             -- Zyprexa/Geodon/ativan agitation protocol PRN            -Additional psychosis labs (HIV- , RPR-, ANA-, heavy metal WNL, ESR wnl, ceruloplasmin wnl and CT head shows Marked for age brain atrophy.  No acute or reversible finding.)   -Depakote, Ativan, and Zyprexa stopped    Hyperammonemia -resolved -Ammonia level 30 on 10/27/21 -Depakote was stopped    Mildly elevated creatinine  -Creatinine up to 1.33 -We will keep on trending as she will cooperate for labs and encouraging po intake    Season allergies -Continue Claritin 159mdaily PRN    Urinary frequency- resolved -repeat UA with rare bacteria, but other wise, WNL  Discharge Planning: Social work and case management to assist with discharge planning and identification of hospital follow-up needs prior to discharge Estimated LOS: 5-7 days Discharge Concerns: Need to establish a safety plan; Medication compliance  and effectiveness Discharge Goals: Return home with outpatient referrals for mental health follow-up including medication management/psychotherapy Labs On Admission- CMP: WNL except Cr: 1.1,  GFR: 60,  CBC: WNL,  UDS: Neg,  Troponin: 3,  EtOH: Neg,  beta hCG: 6.4,  Resp Panel: Neg 5/29- Urine Preg: Neg,  A1c: 5.3,  TSH: 1.719,  Lipid Panel: WNL except LDL: 101 5/24- BMP: WNL except Cr: 1.01,  Ca: 8.4,  hCG: 5,  Urine Preg: Neg 5/28- Ammonia: 32,  CBC: WNL,  CMP: WNL except  Cr: 1.08,  Ca: 8.6,  Depakote lvl: 20 6/3- Depakote lvl: 64, Hep Function: WNL,  CBC: WNL except Hem: 11.9 6/7- Depakote lvl: 75 6/18- UA: Neg,  CBC: WNL,  Depakote lvl: 136,  Ammonia: 99,  BMP: WNL except BUN: 22,  Cr: 1.19,  Ca: 8.6,  GFR: 54,  Hep Function: WNL except Albu: 3.3,  Indirect Bili: 0.2 6/20- ANA: Neg,  Ammonia: 48,  Ceruloplasmin: 24.2,  CMP: WNL except BUN: 23,  Cr: 1.35,  Ca: 8.3,  Albu: 3.4  HIV:Neg,  RPR: Neg,  Sed Rate: 6,  Depakote lvl: 25,  Heavy Metal: Neg 6/23- Ammonia: 46,  BMP: WNL except Cr: 1.2,  Ca: 8.3,  GFR: 54 6/24- UA: WNL except Bacteria: rare 6/30- CMP:  WNL except Cr: 1.04,  Ca: 8.6,  CBC: WNL,  Ammonia: 41, EKG: NSR with Qtc: 399 7/8 - CMP WNL other than CR 1.33 and GFR 48; Ammonia 30  DoNicholes RoughNP 11/02/2021, 5:45 PMPatient ID: AnManuella Ghazifemale   DOB: [id number].   MRN: [id number] ID: AnDONIELLE KAIGLERfemale   DOB: [id number].o.   MRN: [id number] ID: AnZULEYMA SCHARFfemale   DOB: [id number].o.   MRN: [id number]

## 2021-11-02 NOTE — Progress Notes (Signed)
   11/02/21 0833  Psych Admission Type (Psych Patients Only)  Admission Status Involuntary  Psychosocial Assessment  Patient Complaints Confusion  Eye Contact Fair  Facial Expression Animated  Affect Appropriate to circumstance  Speech Soft  Interaction Assertive  Motor Activity Slow  Appearance/Hygiene Disheveled  Behavior Characteristics Cooperative  Mood Pleasant  Thought Process  Coherency Disorganized  Content Preoccupation  Delusions None reported or observed  Perception WDL  Hallucination None reported or observed  Judgment Impaired  Confusion Mild  Danger to Self  Current suicidal ideation? Denies  Agreement Not to Harm Self Yes  Description of Agreement verbal contract  Danger to Others  Danger to Others None reported or observed

## 2021-11-02 NOTE — Group Note (Signed)
LCSW Group Therapy Note   Group Date: 11/02/2021 Start Time: 1100 End Time: 1200   Type of Therapy and Topic:  Group Therapy: Coping Skills  Participation Level:  Active  Due to the acuity and complex discharge plans, group was not held. Patient was provided therapeutic worksheets and asked to meet with CSW as needed.  Aram Beecham, LCSWA 11/02/2021  2:00 PM

## 2021-11-03 DIAGNOSIS — F312 Bipolar disorder, current episode manic severe with psychotic features: Secondary | ICD-10-CM | POA: Diagnosis not present

## 2021-11-03 NOTE — Progress Notes (Signed)
   11/03/21 0630  Sleep  Number of Hours 6

## 2021-11-03 NOTE — Group Note (Signed)
LCSW Group Therapy Note   11/03/2021 4:18 PM  Type of Therapy and Topic:  Group Therapy:   Emotions and Triggers    Participation Level:  Active  Description of Group: Participants were asked to participate in an assignment that involved exploring more about oneself. Patients were asked to identify things that typically triggered an emotional response and think about the physical symptoms they experience when feeling this way. Pt's were encouraged to identify the thoughts that they have when feeling this way and discuss ways to cope with it.  The focus was on the emotion of "anger" during this group.  Patients found they could relate to each other's experiences.  Therapeutic Goals:   Patient will state the definition of an emotion and identify two pleasant and two unpleasant emotions they have experienced. Patient will describe the relationship between thoughts, emotions and triggers.  Patient will state the definition of a trigger and identify three triggers experienced recently   Summary of Patient Progress:   The patient was focused on her anger at mother and at whoever she thinks is taking advantage of her EBT card currently.  No matter the topic at the moment, she reintroduced these same themes every time she was called on.     Therapeutic Modalities: Cognitive Behavioral Therapy Motivational Interviewing

## 2021-11-03 NOTE — Progress Notes (Signed)
Pt is A&OX4, calm, denies suicidal ideations, denies homicidal ideations, denies auditory hallucinations and denies visual hallucinations. Pt verbally agrees to approach staff if these become apparent and before harming self or others. Pt denies experiencing nightmares. Mood and affect are congruent. Pt appetite is ok. Pt complaints of anxiety and depression being "5/10." Pt's appears to be mildly confused. Pt hasn't displayed any injurious behaviors. Pt is medication compliant. There's no evidence of suicidal intent. Psychomotor activity was WNL. No s/s of Parkinson, Dystonia, Akathisia and/or Tardive Dyskinesia noted.

## 2021-11-03 NOTE — Progress Notes (Signed)
[facility] MD Progress Note  11/03/2021 9:58 AM Natalie Brown  MRN:  [id number]  Subjective:   Natalie Brown reported "  I am just so ready to go."  Evaluation: Natalie Brown observed resting in bed.  She was seen and evaluated face-to-face by this provider.  She presents with a bright and pleasant affect.  She reports she has been in the hospital since" 5/20" and feels ready to discharge today.  Reports having restless night on last night. She reported " I woke up about 4 or 5 this morning to take a shower however did not realize what time it was."  Natalie Brown is currently prescribed Haldol 10 mg p.o. nightly and 5 mg daily.  Was reported she has been medication compliant.  States she has been attending daily group sessions with active and engaged participation.  Patient reports " I made several reports for [employer] and they know me.  States I just need to leave here so I can get my money together."   Natalie Brown has a charted history with bipolar affective disorder, major depressive disorder and generalized anxiety disorder.  Per chart patient had upcoming hearing for guardianship on this Tuesday.  CSW to continue to follow.  She continues to deny suicidal or homicidal ideations.  Denies auditory visual hallucinations.  Reports a good appetite.  States she rested as well " most nights"  Support, encouragement and reassurance was provided.   Principal Problem: Bipolar affective disorder, current episode manic with psychotic symptoms (HCC) Diagnosis: Principal Problem:   Bipolar affective disorder, current episode manic with psychotic symptoms (HCC) Active Problems:   Increased ammonia level   Valproic acid causing adverse effect in therapeutic use   Altered mental status  Total Time spent with patient: 15 minutes  Past Psychiatric History:   Past Medical History:  Past Medical History:  Diagnosis Date   Bipolar 1 disorder with moderate mania (HCC) 12/30/2013   Hypertension    History reviewed. No pertinent surgical  history. Family History: History reviewed. No pertinent family history. Family Psychiatric  History:  Social History:  Social History   Substance and Sexual Activity  Alcohol Use No     Social History   Substance and Sexual Activity  Drug Use No    Social History   Socioeconomic History   Marital status: Married    Spouse name: Not on file   Number of children: Not on file   Years of education: Not on file   Highest education level: Not on file  Occupational History   Not on file  Tobacco Use   Smoking status: Every Day   Smokeless tobacco: Current  Substance and Sexual Activity   Alcohol use: No   Drug use: No   Sexual activity: Not on file  Other Topics Concern   Not on file  Social History Narrative   Not on file   Social Determinants of Health   Financial Resource Strain: Not on file  Food Insecurity: Not on file  Transportation Needs: Not on file  Physical Activity: Not on file  Stress: Not on file  Social Connections: Not on file   Additional Social History:                         Sleep: Fair  Appetite:  Fair  Current Medications: Current Facility-Administered Medications  Medication Dose Route Frequency Provider Last Rate Last Admin   acetaminophen (TYLENOL) tablet 650 mg  650 mg Oral Q6H PRN Rhea Belton  S, MD   650 mg at 10/28/21 0757   alum & mag hydroxide-simeth (MAALOX/MYLANTA) 200-200-20 MG/5ML suspension 30 mL  30 mL Oral Q4H PRN Massengill, Harrold Donath, MD       benztropine (COGENTIN) tablet 0.5 mg  0.5 mg Oral BID Karsten Ro, MD   0.5 mg at 11/03/21 0803   feeding supplement (KATE FARMS STANDARD 1.4) liquid 325 mL  325 mL Oral BID BM Massengill, Harrold Donath, MD   325 mL at 11/02/21 0834   fluticasone (FLONASE) 50 MCG/ACT nasal spray 1 spray  1 spray Each Nare Daily Massengill, Harrold Donath, MD   1 spray at 11/03/21 0802   haloperidol (HALDOL) tablet 10 mg  10 mg Oral QHS Bartholomew Crews E, MD   10 mg at 11/02/21 2034   haloperidol  (HALDOL) tablet 5 mg  5 mg Oral Daily Comer Locket, MD   5 mg at 11/03/21 3220   hydrocerin (EUCERIN) cream   Topical BID Phineas Inches, MD   Given at 11/03/21 0804   hydrOXYzine (ATARAX) tablet 25 mg  25 mg Oral TID PRN Phineas Inches, MD   25 mg at 10/23/21 0802   LORazepam (ATIVAN) tablet 1 mg  1 mg Oral Q6H PRN Comer Locket, MD   1 mg at 10/07/21 1546   magnesium hydroxide (MILK OF MAGNESIA) suspension 30 mL  30 mL Oral Daily PRN Massengill, Harrold Donath, MD       nicotine polacrilex (NICORETTE) gum 2 mg  2 mg Oral PRN Abbott Pao, Nadir, MD   2 mg at 11/01/21 0859   OLANZapine zydis (ZYPREXA) disintegrating tablet 5 mg  5 mg Oral Q8H PRN Comer Locket, MD   5 mg at 10/14/21 1416   And   ziprasidone (GEODON) injection 20 mg  20 mg Intramuscular PRN Comer Locket, MD        Lab Results: No results found for this or any previous visit (from the past 48 hour(s)).  Blood Alcohol level:  Lab Results  Component Value Date   ETH <10 09/05/2021    Metabolic Disorder Labs: Lab Results  Component Value Date   HGBA1C 5.3 09/08/2021   MPG 105.41 09/08/2021   No results found for: "PROLACTIN" Lab Results  Component Value Date   CHOL 173 09/08/2021   TRIG 110 09/08/2021   HDL 50 09/08/2021   CHOLHDL 3.5 09/08/2021   VLDL 22 09/08/2021   LDLCALC 101 (H) 09/08/2021   LDLCALC 102 (H) 06/21/2020    Physical Findings: AIMS: Facial and Oral Movements Muscles of Facial Expression: None, normal Lips and Perioral Area: None, normal Jaw: None, normal Tongue: None, normal,Extremity Movements Upper (arms, wrists, hands, fingers): None, normal Lower (legs, knees, ankles, toes): None, normal, Trunk Movements Neck, shoulders, hips: None, normal, Overall Severity Severity of abnormal movements (highest score from questions above): None, normal Incapacitation due to abnormal movements: None, normal Patient's awareness of abnormal movements (rate only patient's report): No Awareness,  Dental Status Current problems with teeth and/or dentures?: No Does patient usually wear dentures?: No  CIWA:    COWS:     Musculoskeletal: Strength & Muscle Tone: within normal limits Gait & Station: normal Patient leans: N/A  Psychiatric Specialty Exam:  Presentation  General Appearance: Disheveled  Eye Contact:Good  Speech:Clear and Coherent  Speech Volume:Normal  Handedness:Right   Mood and Affect  Mood:Euthymic  Affect:Congruent   Thought Process  Thought Processes:Coherent  Descriptions of Associations:Intact  Orientation:Partial  Thought Content:Illogical  History of Schizophrenia/Schizoaffective disorder:No  Duration of  Psychotic Symptoms:Less than six months  Hallucinations:Hallucinations: None  Ideas of Reference:Paranoia  Suicidal Thoughts:Suicidal Thoughts: No  Homicidal Thoughts:Homicidal Thoughts: No   Sensorium  Memory:Immediate Good  Judgment:Poor  Insight:Poor   Executive Functions  Concentration:Fair  Attention Span:Fair  Recall:Poor  Fund of Knowledge:Poor  Language:Fair   Psychomotor Activity  Psychomotor Activity:Psychomotor Activity: Normal   Assets  Assets:Resilience   Sleep  Sleep:Sleep: Fair    Physical Exam: Physical Exam Vitals and nursing note reviewed.  HENT:     Head: Normocephalic.  Cardiovascular:     Rate and Rhythm: Normal rate and regular rhythm.     Pulses: Normal pulses.  Neurological:     Mental Status: [name] alert and oriented to person, place, and time.  Psychiatric:        Mood and Affect: Mood normal.        Thought Content: Thought content normal.    Review of Systems  HENT: Negative.    Cardiovascular: Negative.   Endo/Heme/Allergies: Negative.   Psychiatric/Behavioral:  Positive for hallucinations. Negative for depression and suicidal ideas. The patient is nervous/anxious.   All other systems reviewed and are negative.  Blood pressure 115/77, pulse 100, temperature  97.9 F (36.6 C), temperature source Oral, resp. rate 16, height 5\' 6"  (1.676 m), weight 72.6 kg, SpO2 100 %. Body mass index is 25.83 kg/m.   Treatment Plan Summary: Daily contact with patient to assess and evaluate symptoms and progress in treatment and Medication management  Continue with current treatment plan on 11/03/2021 as listed below except were noted  Bipolar affective disorder: Generalized anxiety disorder: Major depressive disorder  Continue Haldol 5 mg p.o. daily and Haldol 10 mg nightly Continue Cogentin 0.5 mg p.o. twice daily Continue hydroxyzine 25 mg p.o. 3 times daily Continue Ativan 1 mg p.o. nightly as needed  CSW to continue working on discharge disposition Patient encouraged to continue participating daily group session    11/05/2021, NP 11/03/2021, 9:58 AM

## 2021-11-03 NOTE — [facility] Group Notes (Signed)
Wrap up group   Pt attended and contributed to group. 

## 2021-11-03 NOTE — [facility] Group Notes (Signed)
The focus of this group is to help patients establish daily goals to achieve during treatment and discuss how the patient can incorporate goal setting into their daily lives to aide in recovery.  Patient attended goals group. She shared that her goal is to "take meds and discharge". She wanted to share with staff that she would love to leave and discharge.

## 2021-11-04 DIAGNOSIS — F312 Bipolar disorder, current episode manic severe with psychotic features: Secondary | ICD-10-CM | POA: Diagnosis not present

## 2021-11-04 NOTE — Progress Notes (Signed)
Patient alert, verbal and able to make needs known. Patient remains somewhat confused but pleasant. Denies any SI/HI/AVH. Pt denies any pain. Pt attends groups and compliant with medications. Will continue to encourage participation in her treatment.

## 2021-11-04 NOTE — Progress Notes (Signed)
Adult Psychoeducational Group Note  Date:  11/04/2021 Time:  9:40 PM  Group Topic/Focus:  Wrap-Up Group:   The focus of this group is to help patients review their daily goal of treatment and discuss progress on daily workbooks.  Participation Level:  Active  Participation Quality:  Appropriate  Affect:  Appropriate  Cognitive:  Appropriate  Insight: Appropriate  Engagement in Group:  Engaged  Modes of Intervention:  Discussion  Additional Comments:   Pt rated her day 10/10.Pt was able to make some progress toward gaining insight regarding housing, insurance and other social services resources. Pt reportedly plans to  practice being positive and being patient to improve coping.  Natalie Brown 11/04/2021, 9:40 PM

## 2021-11-04 NOTE — Progress Notes (Signed)
   11/04/21 2008  Psych Admission Type (Psych Patients Only)  Admission Status Involuntary  Psychosocial Assessment  Patient Complaints Confusion;Worrying;Nervousness  Eye Contact Fair  Facial Expression Anxious  Affect Anxious;Appropriate to circumstance  Speech Soft  Interaction Isolative;Minimal  Motor Activity Slow  Appearance/Hygiene Unremarkable  Behavior Characteristics Cooperative;Anxious  Mood Pleasant;Anxious  Thought Process  Coherency Disorganized  Content Preoccupation  Delusions None reported or observed  Perception WDL  Hallucination None reported or observed  Judgment Impaired  Confusion Mild  Danger to Self  Current suicidal ideation? Denies   Patient reports that her sister visited her on tonight. Writer asked how her day has been and she replied "okay". She continues to be slow to answer and seems puzzled the majority of the time Clinical research associate spoke with her. Support given and safety maintained with 15 min checks

## 2021-11-04 NOTE — Progress Notes (Signed)
[facility] MD Progress Note  11/04/2021 12:14 PM Natalie Brown  MRN:  [id number]  Evaluation: Natalie Brown was seen and evaluated face-to-face by this provider.  She presents slightly guarded but pleasant.  Denying suicidal or homicidal ideations.  Denies auditory visual hallucinations.  Has been compliant with medications.  Denying any medication side effects. Per nursing staff patient needs some redirection  and she has been attending and participating with group sessions. Charted member about his slept 6 last night.  No safety concerns noted on the unit.   Natalie Brown is resting  in no acute distress. [name] alert, oriented x 3; calm and cooperative her mood congruent with affect. [name] speaking in a clear tone at moderate volume, and normal pace; with good eye contact.  There is no indication that she is currently responding to internal/external stimuli or that she is experiencing psychosis and or paranoia. Patient has remained calm throughout assessment and has answered questions appropriately.     Principal Problem: Bipolar affective disorder, current episode manic with psychotic symptoms (HCC) Diagnosis: Principal Problem:   Bipolar affective disorder, current episode manic with psychotic symptoms (HCC) Active Problems:   Increased ammonia level   Valproic acid causing adverse effect in therapeutic use   Altered mental status  Total Time spent with patient: 15 minutes  Past Psychiatric History:   Past Medical History:  Past Medical History:  Diagnosis Date   Bipolar 1 disorder with moderate mania (HCC) 12/30/2013   Hypertension    History reviewed. No pertinent surgical history. Family History: History reviewed. No pertinent family history. Family Psychiatric  History:  Social History:  Social History   Substance and Sexual Activity  Alcohol Use No     Social History   Substance and Sexual Activity  Drug Use No    Social History   Socioeconomic History   Marital status:  Married    Spouse name: Not on file   Number of children: Not on file   Years of education: Not on file   Highest education level: Not on file  Occupational History   Not on file  Tobacco Use   Smoking status: Every Day   Smokeless tobacco: Current  Substance and Sexual Activity   Alcohol use: No   Drug use: No   Sexual activity: Not on file  Other Topics Concern   Not on file  Social History Narrative   Not on file   Social Determinants of Health   Financial Resource Strain: Not on file  Food Insecurity: Not on file  Transportation Needs: Not on file  Physical Activity: Not on file  Stress: Not on file  Social Connections: Not on file   Additional Social History:                         Sleep: Fair  Appetite:  Fair  Current Medications: Current Facility-Administered Medications  Medication Dose Route Frequency Provider Last Rate Last Admin   acetaminophen (TYLENOL) tablet 650 mg  650 mg Oral Q6H PRN Lauro Franklin, MD   650 mg at 10/28/21 0757   alum & mag hydroxide-simeth (MAALOX/MYLANTA) 200-200-20 MG/5ML suspension 30 mL  30 mL Oral Q4H PRN Massengill, Harrold Donath, MD       benztropine (COGENTIN) tablet 0.5 mg  0.5 mg Oral BID Karsten Ro, MD   0.5 mg at 11/04/21 0804   feeding supplement (KATE FARMS STANDARD 1.4) liquid 325 mL  325 mL Oral BID BM  Phineas Inches, MD   325 mL at 11/02/21 0834   fluticasone (FLONASE) 50 MCG/ACT nasal spray 1 spray  1 spray Each Nare Daily Massengill, Harrold Donath, MD   1 spray at 11/04/21 0804   haloperidol (HALDOL) tablet 10 mg  10 mg Oral QHS Mason Jim, Amy E, MD   10 mg at 11/03/21 2210   haloperidol (HALDOL) tablet 5 mg  5 mg Oral Daily Comer Locket, MD   5 mg at 11/04/21 8466   hydrocerin (EUCERIN) cream   Topical BID Phineas Inches, MD   Given at 11/03/21 1623   hydrOXYzine (ATARAX) tablet 25 mg  25 mg Oral TID PRN Phineas Inches, MD   25 mg at 10/23/21 0802   LORazepam (ATIVAN) tablet 1 mg  1 mg Oral Q6H  PRN Comer Locket, MD   1 mg at 10/07/21 1546   magnesium hydroxide (MILK OF MAGNESIA) suspension 30 mL  30 mL Oral Daily PRN Massengill, Harrold Donath, MD       nicotine polacrilex (NICORETTE) gum 2 mg  2 mg Oral PRN Abbott Pao, Nadir, MD   2 mg at 11/01/21 0859   OLANZapine zydis (ZYPREXA) disintegrating tablet 5 mg  5 mg Oral Q8H PRN Comer Locket, MD   5 mg at 10/14/21 1416   And   ziprasidone (GEODON) injection 20 mg  20 mg Intramuscular PRN Comer Locket, MD        Lab Results: No results found for this or any previous visit (from the past 48 hour(s)).  Blood Alcohol level:  Lab Results  Component Value Date   ETH <10 09/05/2021    Metabolic Disorder Labs: Lab Results  Component Value Date   HGBA1C 5.3 09/08/2021   MPG 105.41 09/08/2021   No results found for: "PROLACTIN" Lab Results  Component Value Date   CHOL 173 09/08/2021   TRIG 110 09/08/2021   HDL 50 09/08/2021   CHOLHDL 3.5 09/08/2021   VLDL 22 09/08/2021   LDLCALC 101 (H) 09/08/2021   LDLCALC 102 (H) 06/21/2020    Physical Findings: AIMS: Facial and Oral Movements Muscles of Facial Expression: None, normal Lips and Perioral Area: None, normal Jaw: None, normal Tongue: None, normal,Extremity Movements Upper (arms, wrists, hands, fingers): None, normal Lower (legs, knees, ankles, toes): None, normal, Trunk Movements Neck, shoulders, hips: None, normal, Overall Severity Severity of abnormal movements (highest score from questions above): None, normal Incapacitation due to abnormal movements: None, normal Patient's awareness of abnormal movements (rate only patient's report): No Awareness, Dental Status Current problems with teeth and/or dentures?: No Does patient usually wear dentures?: No  CIWA:    COWS:     Musculoskeletal: Strength & Muscle Tone: within normal limits Gait & Station: normal Patient leans: N/A  Psychiatric Specialty Exam:  Presentation  General Appearance: Disheveled  Eye  Contact:Good  Speech:Clear and Coherent  Speech Volume:Normal  Handedness:Right   Mood and Affect  Mood:Euthymic  Affect:Congruent   Thought Process  Thought Processes:Coherent  Descriptions of Associations:Intact  Orientation:Partial  Thought Content:Illogical  History of Schizophrenia/Schizoaffective disorder:No  Duration of Psychotic Symptoms:Less than six months  Hallucinations:No data recorded  Ideas of Reference:Paranoia  Suicidal Thoughts:No data recorded  Homicidal Thoughts:No data recorded   Sensorium  Memory:Immediate Good  Judgment:Poor  Insight:Poor   Executive Functions  Concentration:Fair  Attention Span:Fair  Recall:Poor  Fund of Knowledge:Poor  Language:Fair   Psychomotor Activity  Psychomotor Activity:No data recorded   Assets  Assets:Resilience   Sleep  Sleep:No data recorded  Physical Exam: Physical Exam Vitals and nursing note reviewed.  HENT:     Head: Normocephalic.  Cardiovascular:     Rate and Rhythm: Normal rate and regular rhythm.     Pulses: Normal pulses.  Neurological:     Mental Status: [name] alert and oriented to person, place, and time.  Psychiatric:        Mood and Affect: Mood normal.        Thought Content: Thought content normal.    Review of Systems  HENT: Negative.    Cardiovascular: Negative.   Endo/Heme/Allergies: Negative.   Psychiatric/Behavioral:  Positive for hallucinations. Negative for depression and suicidal ideas. The patient is nervous/anxious.   All other systems reviewed and are negative.  Blood pressure 116/78, pulse (!) 103, temperature 98.1 F (36.7 C), temperature source Oral, resp. rate 16, height 5\' 6"  (1.676 m), weight 72.6 kg, SpO2 98 %. Body mass index is 25.83 kg/m.   Treatment Plan Summary: Daily contact with patient to assess and evaluate symptoms and progress in treatment and Medication management  Continue with current treatment plan on 11/04/2021 as  listed below except were noted  Bipolar affective disorder: Generalized anxiety disorder: Major depressive disorder  Continue Haldol 5 mg p.o. daily and Haldol 10 mg nightly Continue Cogentin 0.5 mg p.o. twice daily Continue hydroxyzine 25 mg p.o. 3 times daily Continue Ativan 1 mg p.o. nightly as needed  CSW to continue working on discharge disposition Patient encouraged to continue participating daily group session    11/06/2021, NP 11/04/2021, 12:14 PM

## 2021-11-04 NOTE — Plan of Care (Signed)
  Problem: Education: Goal: Knowledge of [place] [facility] Education information/materials will improve Outcome: Progressing Goal: Emotional status will improve Outcome: Progressing Goal: Mental status will improve Outcome: Progressing Goal: Verbalization of understanding the information provided will improve Outcome: Progressing   Problem: Activity: Goal: Interest or engagement in activities will improve Outcome: Progressing Goal: Sleeping patterns will improve Outcome: Progressing   Problem: Coping: Goal: Ability to verbalize frustrations and anger appropriately will improve Outcome: Progressing Goal: Ability to demonstrate self-control will improve Outcome: Progressing   Problem: Health Behavior/Discharge Planning: Goal: Identification of resources available to assist in meeting health care needs will improve Outcome: Progressing Goal: Compliance with treatment plan for underlying cause of condition will improve Outcome: Progressing   Problem: Physical Regulation: Goal: Ability to maintain clinical measurements within normal limits will improve Outcome: Progressing   Problem: Safety: Goal: Periods of time without injury will increase Outcome: Progressing   Problem: Education: Goal: Ability to make informed decisions regarding treatment will improve Outcome: Progressing   Problem: Coping: Goal: Coping ability will improve Outcome: Progressing   Problem: Health Behavior/Discharge Planning: Goal: Identification of resources available to assist in meeting health care needs will improve Outcome: Progressing   Problem: Medication: Goal: Compliance with prescribed medication regimen will improve Outcome: Progressing   Problem: Self-Concept: Goal: Ability to disclose and discuss suicidal ideas will improve Outcome: Progressing Goal: Will verbalize positive feelings about self Outcome: Progressing   Problem: Activity: Goal: Will verbalize the importance of  balancing activity with adequate rest periods Outcome: Progressing   Problem: Education: Goal: Will be free of psychotic symptoms Outcome: Progressing Goal: Knowledge of the prescribed therapeutic regimen will improve Outcome: Progressing   Problem: Coping: Goal: Coping ability will improve Outcome: Progressing Goal: Will verbalize feelings Outcome: Progressing   Problem: Health Behavior/Discharge Planning: Goal: Compliance with prescribed medication regimen will improve Outcome: Progressing   Problem: Nutritional: Goal: Ability to achieve adequate nutritional intake will improve Outcome: Progressing   Problem: Role Relationship: Goal: Ability to communicate needs accurately will improve Outcome: Progressing Goal: Ability to interact with others will improve Outcome: Progressing   Problem: Safety: Goal: Ability to redirect hostility and anger into socially appropriate behaviors will improve Outcome: Progressing Goal: Ability to remain free from injury will improve Outcome: Progressing   Problem: Self-Care: Goal: Ability to participate in self-care as condition permits will improve Outcome: Progressing   Problem: Self-Concept: Goal: Will verbalize positive feelings about self Outcome: Progressing

## 2021-11-04 NOTE — Group Note (Signed)
[facility] LCSW Group Therapy Note  Date/Time:  11/04/2021  11:00AM-12:00PM  Type of Therapy and Topic:  Group Therapy:  Music and Mood  Participation Level:  Active   Description of Group: In this process group, members listened to a variety of genres of music and identified that different types of music evoke different responses.  Patients were encouraged to identify music that was soothing for them and music that was energizing for them.  Patients discussed how this knowledge can help with wellness and recovery in various ways including managing depression and anxiety as well as encouraging healthy sleep habits.    Therapeutic Goals: Patients will explore the impact of different varieties of music on mood Patients will verbalize the thoughts they have when listening to different types of music Patients will identify music that is soothing to them as well as music that is energizing to them Patients will discuss how to use this knowledge to assist in maintaining wellness and recovery Patients will explore the use of music as a coping skill  Summary of Patient Progress:  At the beginning of group, patient expressed her mood was good.  At the end of group, patient expressed her mood was more emotional, saying that some of the music touched her, adding "I understand that because I'm a spiritual person."   She sat in her chair and danced for part of group..    Therapeutic Modalities: Solution Focused Brief Therapy Activity   Ambrose Mantle, LCSW

## 2021-11-04 NOTE — [facility] Group Notes (Signed)
[facility] Group Notes:  (Nursing/MHT/Case Management/Adjunct)  Date:  11/04/2021  Time:  4:39 PM  Type of Therapy:  Group Therapy  Summary of Progress/Problems:  Patient participated in a goals/ orientation group today. Patient's goals for today is to leave.   Daneil Dan 11/04/2021, 4:39 PM

## 2021-11-05 DIAGNOSIS — F312 Bipolar disorder, current episode manic severe with psychotic features: Secondary | ICD-10-CM | POA: Diagnosis not present

## 2021-11-05 MED ORDER — MELATONIN 3 MG PO TABS
3.0000 mg | ORAL_TABLET | Freq: Every day | ORAL | Status: DC
  Administered 2021-11-05 – 2021-11-08 (×4): 3 mg via ORAL
  Filled 2021-11-05 (×7): qty 1

## 2021-11-05 NOTE — Group Note (Signed)
LCSW Group Therapy Note  Group Date: 11/05/2021 Start Time: 1300 End Time: 1400   Type of Therapy and Topic:  Group Therapy - How To Cope with Nervousness about Discharge   Participation Level:  Minimal   Description of Group This process group involved identification of patients' feelings about discharge. Some of them are scheduled to be discharged soon, while others are new admissions, but each of them was asked to share thoughts and feelings surrounding discharge from the hospital. One common theme was that they are excited at the prospect of going home, while another was that many of them are apprehensive about sharing why they were hospitalized. Patients were given the opportunity to discuss these feelings with their peers in preparation for discharge.  Therapeutic Goals  Patient will identify their overall feelings about pending discharge. Patient will think about how they might proactively address issues that they believe will once again arise once they get home (i.e. with parents). Patients will participate in discussion about having hope for change.   Summary of Patient Progress:   Patient was present for the entirety of group session. Patient participated in opening and closing remarks. However, patient did not contribute at all to the topic of discussion despite encouraged participation.   Therapeutic Modalities Cognitive Behavioral Therapy   Almedia Balls 11/05/2021  4:21 PM

## 2021-11-05 NOTE — Progress Notes (Signed)
[facility] MD Progress Note  11/05/2021 5:37 PM Natalie Brown  MRN:  [id number]  Reason For Admission: Natalie Brown is a 56 yr old female who presented on 5/17 to [facility] with complaints of anxiety and feeling overwhelmed and SI without a plan, she was admitted to [facility] on 5/19.  PPHx is significant for possible Bipolar Disorder.  24 hr chart review: As per review of pt's chart, she has been medication compliant, and has not required a PRN medication the past 24 hrs. Her Vital signs have been mostly WNL for past 24 hrs, with the exception of a slightly elevated HR at 116 & 102 when rechecked earlier today. As per flow sheets, she slept a total 4.75 hrs last night. As per nursing documentation, over the past 24 hrs, she is confused, disorganized, but pleasant sometimes, and irritable sometimes.  Today's patient assessment note: During this encounter, pt presents with a euthymic mood, and affect is congruent. Her attention to personal hygiene and grooming is fair, eye contact is good, speech is clear & coherent. Thought contents are unorganized and pt presents with flight of ideas and is circumstantial when providing answers to questions. She currently denies SI/HI/AVH. She does not appear to be responding to any internal stimuli, but continues to think that she can speak several languages, and is resistant and refuses blood draws, and also becomes resistant when positive reinforcements are being given for her clothing to be laundered. She continues to wear same jacket, and will not allow for staff to wash it.   Natalie Brown reports a good sleep quality last night even though as per flow sheets, she only slept for 4.75 hrs.  Melatonin 3 mg nightly ordered for insomnia. She reports a good appetite, and denies any current medication related side effects. There are no TD/EPS type symptoms on assessment today. Will continue medications as listed below.  Principal Problem: Bipolar affective disorder, current episode manic  with psychotic symptoms ([place]) Diagnosis: Principal Problem:   Bipolar affective disorder, current episode manic with psychotic symptoms ([place]) Active Problems:   Increased ammonia level   Valproic acid causing adverse effect in therapeutic use   Altered mental status  Total Time spent with patient:  I personally spent 30 minutes on the unit in direct patient care. The direct patient care time included face-to-face time with the patient, reviewing the patient's chart, communicating with other professionals, and coordinating care. Greater than 50% of this time was spent in counseling or coordinating care with the patient regarding goals of hospitalization, psycho-education, and discharge planning needs.  Past Psychiatric History: Probable bipolar disorder  Past Medical History:  Past Medical History:  Diagnosis Date   Bipolar 1 disorder with moderate mania ([place]) 12/30/2013   Hypertension    History reviewed. No pertinent surgical history. Family History: History reviewed. No pertinent family history. Family Psychiatric  History: Patient reports mental illness runs in her father's side of the family Social History:  Social History   Substance and Sexual Activity  Alcohol Use No     Social History   Substance and Sexual Activity  Drug Use No    Social History   Socioeconomic History   Marital status: Married    Spouse name: Not on file   Number of children: Not on file   Years of education: Not on file   Highest education level: Not on file  Occupational History   Not on file  Tobacco Use   Smoking status: Every Day   Smokeless tobacco:  Current  Substance and Sexual Activity   Alcohol use: No   Drug use: No   Sexual activity: Not on file  Other Topics Concern   Not on file  Social History Narrative   Not on file   Social Determinants of Health   Financial Resource Strain: Not on file  Food Insecurity: Not on file  Transportation Needs: Not on file  Physical  Activity: Not on file  Stress: Not on file  Social Connections: Not on file   Additional Social History:   Sleep: Good  Appetite:  Good  Current Medications: Current Facility-Administered Medications  Medication Dose Route Frequency Provider Last Rate Last Admin   acetaminophen (TYLENOL) tablet 650 mg  650 mg Oral Q6H PRN Briant Cedar, MD   650 mg at 10/28/21 0757   alum & mag hydroxide-simeth (MAALOX/MYLANTA) 200-200-20 MG/5ML suspension 30 mL  30 mL Oral Q4H PRN Massengill, [name], MD       benztropine (COGENTIN) tablet 0.5 mg  0.5 mg Oral BID Armando Reichert, MD   0.5 mg at 11/05/21 0819   feeding supplement (KATE FARMS STANDARD 1.4) liquid 325 mL  325 mL Oral BID BM Massengill, [name], MD   325 mL at 11/05/21 1437   fluticasone (FLONASE) 50 MCG/ACT nasal spray 1 spray  1 spray Each Nare Daily Massengill, [name], MD   1 spray at 11/05/21 0820   haloperidol (HALDOL) tablet 10 mg  10 mg Oral QHS Nelda Marseille, Amy E, MD   10 mg at 11/04/21 2048   haloperidol (HALDOL) tablet 5 mg  5 mg Oral Daily Harlow Asa, MD   5 mg at 11/05/21 0454   hydrocerin (EUCERIN) cream   Topical BID Janine Limbo, MD   Given at 11/04/21 1608   hydrOXYzine (ATARAX) tablet 25 mg  25 mg Oral TID PRN Janine Limbo, MD   25 mg at 10/23/21 0802   LORazepam (ATIVAN) tablet 1 mg  1 mg Oral Q6H PRN Harlow Asa, MD   1 mg at 10/07/21 1546   magnesium hydroxide (MILK OF MAGNESIA) suspension 30 mL  30 mL Oral Daily PRN Massengill, [name], MD   30 mL at 11/05/21 0981   melatonin tablet 3 mg  3 mg Oral QHS Binnie Vonderhaar, NP       nicotine polacrilex (NICORETTE) gum 2 mg  2 mg Oral PRN Winfred Leeds, Nadir, MD   2 mg at 11/01/21 0859   OLANZapine zydis (ZYPREXA) disintegrating tablet 5 mg  5 mg Oral Q8H PRN Viann Fish E, MD   5 mg at 10/14/21 1416   And   ziprasidone (GEODON) injection 20 mg  20 mg Intramuscular PRN Harlow Asa, MD       Lab Results:  No results found for this or any previous  visit (from the past 71 hour(s)).   Blood Alcohol level:  Lab Results  Component Value Date   ETH <10 19/14/7829   Metabolic Disorder Labs: Lab Results  Component Value Date   HGBA1C 5.3 09/08/2021   MPG 105.41 09/08/2021   No results found for: "PROLACTIN" Lab Results  Component Value Date   CHOL 173 09/08/2021   TRIG 110 09/08/2021   HDL 50 09/08/2021   CHOLHDL 3.5 09/08/2021   VLDL 22 09/08/2021   LDLCALC 101 (H) 09/08/2021   LDLCALC 102 (H) 06/21/2020   Physical Findings:  AIMS: Facial and Oral Movements Muscles of Facial Expression: None, normal Lips and Perioral Area: None, normal Jaw: None, normal Tongue: None,  normal,Extremity Movements Upper (arms, wrists, hands, fingers): None, normal Lower (legs, knees, ankles, toes): None, normal, Trunk Movements Neck, shoulders, hips: None, normal, Overall Severity Severity of abnormal movements (highest score from questions above): None, normal Incapacitation due to abnormal movements: None, normal Patient's awareness of abnormal movements (rate only patient's report): No Awareness, Dental Status Current problems with teeth and/or dentures?: No Does patient usually wear dentures?: No  No Cogwheeling or Rigidity Present.  Musculoskeletal: Strength & Muscle Tone: within normal limits Gait & Station: normal Patient leans: N/A  Psychiatric Specialty Exam:  Presentation  General Appearance: unkempt appearing   Eye Contact:Fair  Speech:rambling but normal fluency  Speech Volume:Normal  Mood and Affect  Mood: appears anxious and at times irritable when discussing ADLs and clothes washing  Affect:anxious  Thought Process  Thought Processes: perseverative and ruminative  Orientation:Partial  Thought Content: Continues to have baseline, fixed delusions but does not mention these unless prompted with questions; is not grossly responding to internal/external stimuli on exam; denies SI, HI, AVH or paranoia and  denies ideas of reference or first rank symptoms but is guarded and perseverative about need for continued admission   Hallucinations:Hallucinations: None   Ideas of Reference:Denied  Suicidal Thoughts:Suicidal Thoughts: No   Homicidal Thoughts:Homicidal Thoughts: No   Sensorium  Memory:Limited   Judgment:Poor  Insight:Poor  Executive Functions  Concentration:Fair  Attention Span:Fair  Recall:Poor  Fund of Knowledge:Limited  Language:Fair  Psychomotor Activity  Psychomotor Activity:Psychomotor Activity: Normal   Assets  Assets:Resilience  Sleep  6.25 hours  Physical Exam Vitals and nursing note reviewed.  Constitutional:      General: [name] not in acute distress.    Appearance: Normal appearance. [name] normal weight. [name] not ill-appearing or toxic-appearing.  HENT:     Head: Normocephalic and atraumatic.  Pulmonary:     Effort: Pulmonary effort is normal.  Musculoskeletal:        General: Normal range of motion.  Neurological:     General: No focal deficit present.     Mental Status: [name] alert.     Sensory: No sensory deficit.     Coordination: Coordination normal.  Psychiatric:        Behavior: Behavior normal.    Review of Systems  Respiratory:  Negative for cough and shortness of breath.   Cardiovascular:  Negative for chest pain.  Gastrointestinal:  Negative for abdominal pain, constipation, diarrhea, nausea and vomiting.  Neurological:  Positive for headaches (She took some tylenol. Mild). Negative for dizziness and weakness.  Psychiatric/Behavioral:  Positive for memory loss. Negative for depression (She rated her Depression as 5/10 on a scale of 1-10. 10 being the worst depression.), substance abuse and suicidal ideas. Hallucinations: paranoia.The patient is not nervous/anxious and does not have insomnia.    Blood pressure 118/66, pulse (!) 102, temperature 98.3 F (36.8 C), temperature source Oral, resp. rate 16, height 5' 6"  (1.676  m), weight 72.6 kg, SpO2 100 %. Body mass index is 25.83 kg/m.  Treatment Plan Summary: Daily contact with patient to assess and evaluate symptoms and progress in treatment and Medication management  Safety and Monitoring: Involuntary admission to inpatient psychiatric unit for safety, stabilization and treatment Daily contact with patient to assess and evaluate symptoms and progress in treatment Patient's case to be discussed in multi-disciplinary team meeting Observation Level : q15 minute checks Vital signs: q12 hours Precautions: safety   Bipolar I MRE manic with psychotic features (r/o schizoaffective d/o - bipolar type)  -Continue  Haldol 5 mg AM and 10 mg QHS for psychosis and mood stabilization  -Start Melatonin 3 mg nightly for insomnia -Continue Cogentin 0.5 mg twice daily  -Invega Sustenna 234 mg IM given on 09-30-21 186m IM given 10/12/20 - due for next monthly IM injection on 11/09/21 -- Zyprexa/Geodon/ativan agitation protocol PR  -Additional psychosis labs (HIV- , RPR-, ANA-, heavy metal WNL, ESR wnl, ceruloplasmin wnl and CT head shows Marked for age brain atrophy.  No acute or reversible finding.)   -Depakote, Ativan, and Zyprexa stopped    Hyperammonemia -resolved -Ammonia level 30 on 10/27/21 -Depakote was stopped    Mildly elevated creatinine  -Creatinine up to 1.33 -We will keep on trending as she will cooperate for labs and encouraging po intake    Season allergies -Continue Claritin 133mdaily PRN    Urinary frequency- resolved -repeat UA with rare bacteria, but other wise, WNL  Discharge Planning: Social work and case management to assist with discharge planning and identification of hospital follow-up needs prior to discharge Estimated LOS: 5-7 days Discharge Concerns: Need to establish a safety plan; Medication compliance and effectiveness Discharge Goals: Return home with outpatient referrals for mental health follow-up including medication  management/psychotherapy Labs On Admission- CMP: WNL except Cr: 1.1,  GFR: 60,  CBC: WNL,  UDS: Neg,  Troponin: 3,  EtOH: Neg,  beta hCG: 6.4,  Resp Panel: Neg 5/29- Urine Preg: Neg,  A1c: 5.3,  TSH: 1.719,  Lipid Panel: WNL except LDL: 101 5/24- BMP: WNL except Cr: 1.01,  Ca: 8.4,  hCG: 5,  Urine Preg: Neg 5/28- Ammonia: 32,  CBC: WNL,  CMP: WNL except  Cr: 1.08,  Ca: 8.6,  Depakote lvl: 20 6/3- Depakote lvl: 64, Hep Function: WNL,  CBC: WNL except Hem: 11.9 6/7- Depakote lvl: 75 6/18- UA: Neg,  CBC: WNL,  Depakote lvl: 136,  Ammonia: 99,  BMP: WNL except BUN: 22,  Cr: 1.19,  Ca: 8.6,  GFR: 54,  Hep Function: WNL except Albu: 3.3,  Indirect Bili: 0.2 6/20- ANA: Neg,  Ammonia: 48,  Ceruloplasmin: 24.2,  CMP: WNL except BUN: 23,  Cr: 1.35,  Ca: 8.3,  Albu: 3.4  HIV:Neg,  RPR: Neg,  Sed Rate: 6,  Depakote lvl: 25,  Heavy Metal: Neg 6/23- Ammonia: 46,  BMP: WNL except Cr: 1.2,  Ca: 8.3,  GFR: 54 6/24- UA: WNL except Bacteria: rare 6/30- CMP:  WNL except Cr: 1.04,  Ca: 8.6,  CBC: WNL,  Ammonia: 41, EKG: NSR with Qtc: 399 7/8 - CMP WNL other than CR 1.33 and GFR 48; Ammonia 30  DoNicholes RoughNP 11/05/2021, 5:37 PMPatient ID: AnManuella Ghazifemale   DOB: 04/[id number].o.   MRN: [id number]

## 2021-11-05 NOTE — Group Note (Signed)
Recreation Therapy Group Note   Group Topic:Coping Skills  Group Date: 11/05/2021 Start Time: 1005 End Time: 1100 Facilitators: Caroll Rancher, LRT,CTRS Location: [address] Dayroom   Goal Area(s) Addresses: Patient will define what a coping skill is. Patient will successfully identify positive coping skills they can use post d/c.  Patient will acknowledge benefit(s) of using learned coping skills post d/c.  Group Description:  Coping A to Z. Patient asked to identify what a coping skill is and when they use them. Patients with Clinical research associate discussed healthy versus unhealthy coping skills. Next patients were given a blank worksheet titled "Coping Skills A-Z". Patients were instructed to come up with at least one positive coping skill per letter of the alphabet, addressing a specific challenge (ex: stress, anger, anxiety, depression, grief, doubt, isolation, self-harm/suicidal thoughts, substance use). Patients were given 20 minutes to brainstorm before ideas were presented to the large group. Patients and LRT debriefed on the importance of coping skill selection based on situation and back-up plans when a skill tried is not effective.    Affect/Mood: N/A   Participation Level: Did not attend    Clinical Observations/Individualized Feedback:     Plan: Continue to engage patient in RT group sessions 2-3x/week.   Caroll Rancher, LRT,CTRS 11/05/2021 1:12 PM

## 2021-11-05 NOTE — Progress Notes (Signed)
Pt denies SI/HI/AVH and verbally agrees to approach staff if these become apparent or before harming themselves/others. Rates depression 7/10. Rates anxiety 5/10. Rates pain 0/10. Pt has been in and out of the dayroom for most of the day. Pt is preoccupied with d/c. Pt was confused about IVC and court date paperwork. Pt has been in and out of her room throughout the day. Pt did complain of constipation and was given MOM. Scheduled medications administered to pt, per MD orders. RN provided support and encouragement to pt. Q15 min safety checks implemented and continued. Pt safe on the unit. RN will continue to monitor and intervene as needed.   11/05/21 1937  Psych Admission Type (Psych Patients Only)  Admission Status Involuntary  Psychosocial Assessment  Patient Complaints Worrying;Confusion;Anxiety;Depression  Eye Contact Fair  Facial Expression Anxious  Affect Anxious;Appropriate to circumstance;Sad  Speech Soft;Logical/coherent  Interaction Assertive  Motor Activity Slow  Appearance/Hygiene Unremarkable  Behavior Characteristics Cooperative;Anxious  Mood Anxious;Depressed;Pleasant;Preoccupied  Thought Process  Coherency Disorganized  Content Preoccupation  Delusions None reported or observed  Perception WDL  Hallucination None reported or observed  Judgment Limited  Confusion None  Danger to Self  Current suicidal ideation? Denies  Danger to Others  Danger to Others None reported or observed

## 2021-11-05 NOTE — Progress Notes (Signed)
Adult Psychoeducational Group Note  Date:  11/05/2021 Time:  8:37 PM  Group Topic/Focus:  Wrap-Up Group:   The focus of this group is to help patients review their daily goal of treatment and discuss progress on daily workbooks.  Participation Level:  Active  Participation Quality:  Appropriate  Affect:  Appropriate  Cognitive:  Appropriate  Insight: Appropriate  Engagement in Group:  Developing/Improving  Modes of Intervention:  Discussion  Additional Comments:  Pt stated her goal for today was to focus on her treatment plan. Pt stated she accomplished her goal today. Pt stated she did not get a chance to speak with a doctor but did speak with her social worker about her care today. Pt rated her overall day a 10. Pt stated she was able to contact her mother and her mother coming for visitation tonight  improved her overall day. Pt stated she felt better about herself tonight. Pt stated she was able to attend all meals today.  Pt stated she took all medications provided today. Pt stated her appetite was pretty good today. Pt rated her sleep last night was fair. Pt stated the goal tonight was to get some rest. Pt stated she had no physical pain tonight. Pt deny visual hallucinations and auditory issues tonight. Pt denies thoughts of harming herself or others. Pt stated she would alert staff if anything changed.  Felipa Furnace 11/05/2021, 8:37 PM

## 2021-11-05 NOTE — Progress Notes (Signed)
   11/05/21 2115  Psych Admission Type (Psych Patients Only)  Admission Status Involuntary  Psychosocial Assessment  Patient Complaints Worrying;Confusion  Eye Contact Fair  Facial Expression Anxious  Affect Anxious  Speech Soft  Interaction Assertive  Motor Activity Slow  Appearance/Hygiene Disheveled  Behavior Characteristics Cooperative  Mood Anxious  Aggressive Behavior  Effect No apparent injury  Thought Process  Coherency Disorganized  Content Preoccupation  Delusions None reported or observed  Perception WDL  Hallucination None reported or observed  Judgment Limited  Confusion Mild  Danger to Self  Current suicidal ideation? Denies

## 2021-11-05 NOTE — [facility] Group Notes (Signed)
[facility] Group Notes:  (Nursing/MHT/Case Management/Adjunct)  Date:  11/05/2021  Time:  10:18 AM  Type of Therapy:   Orientation/Goals group  Participation Level:  Active  Participation Quality:  Intrusive and Inattentive  Affect:  Irritable and Not Congruent  Cognitive:  Disorganized and Confused  Insight:  Lacking  Engagement in Group:  Developing/Improving  Modes of Intervention:  Discussion, Education, Orientation, and Support  Summary of Progress/Problems: Pt goal for today is to get out here. It upsetting to see everyone else come and go.   Natalie Brown J Natalie Brown 11/05/2021, 10:18 AM

## 2021-11-06 DIAGNOSIS — F312 Bipolar disorder, current episode manic severe with psychotic features: Secondary | ICD-10-CM | POA: Diagnosis not present

## 2021-11-06 LAB — COMPREHENSIVE METABOLIC PANEL
ALT: 17 U/L (ref 0–44)
AST: 17 U/L (ref 15–41)
Albumin: 3.6 g/dL (ref 3.5–5.0)
Alkaline Phosphatase: 56 U/L (ref 38–126)
Anion gap: 9 (ref 5–15)
BUN: 20 mg/dL (ref 6–20)
CO2: 27 mmol/L (ref 22–32)
Calcium: 9.1 mg/dL (ref 8.9–10.3)
Chloride: 106 mmol/L (ref 98–111)
Creatinine, Ser: 1.22 mg/dL — ABNORMAL HIGH (ref 0.44–1.00)
GFR, Estimated: 53 mL/min — ABNORMAL LOW (ref >60)
Glucose, Bld: 96 mg/dL (ref 70–99)
Potassium: 3.8 mmol/L (ref 3.5–5.1)
Sodium: 142 mmol/L (ref 135–145)
Total Bilirubin: 0.4 mg/dL (ref 0.3–1.2)
Total Protein: 7.4 g/dL (ref 6.5–8.1)

## 2021-11-06 NOTE — Progress Notes (Signed)
   11/06/21 2215  Psych Admission Type (Psych Patients Only)  Admission Status Involuntary  Psychosocial Assessment  Patient Complaints Worrying;Confusion  Eye Contact Fair  Facial Expression Anxious  Affect Anxious  Speech Soft  Interaction Assertive  Motor Activity Slow  Appearance/Hygiene Disheveled  Behavior Characteristics Cooperative  Mood Preoccupied  Aggressive Behavior  Effect No apparent injury  Thought Process  Coherency Disorganized  Content Preoccupation  Delusions None reported or observed  Perception WDL  Hallucination None reported or observed  Judgment Limited  Confusion Mild  Danger to Self  Current suicidal ideation? Denies

## 2021-11-06 NOTE — Progress Notes (Signed)
[facility] MD Progress Note  11/06/2021 3:29 PM [name]  MRN:  [id number]  Reason For Admission: Natalie Brown is a 56 yr old female who presented on 5/17 to [facility] with complaints of anxiety and feeling overwhelmed and SI without a plan, she was admitted to [facility] on 5/19.  PPHx is significant for possible Bipolar Disorder.   Yesterday, the psychiatry team made following recommendations: No changes  Interval History: PRN Medications administered within the last 24 hours: none Per nursing staff: patient has exhibited flat affect and a disorganized thought process.    Per Patient:  On assessment today, the patient reports an "okay" mood with a "fine" energy level and appetite. She states that her family came to visit her yesterday but cannot state any details about the visit. [name] insistent on leaving today and asks "who can pay for my storage?". She shows the interviewer the IVC paperwork and when asked what it is, she states "it's bad". She mutters about "people taking my blood" repeatedly. She denies suicidal thoughts and homicidal thoughts. She denies auditory of visual hallucinations.   Patient denies side effects to current scheduled psychiatric medications.   Patient denies other somatic complaints.    Principal Problem: Bipolar affective disorder, current episode manic with psychotic symptoms (Natalie Brown) Diagnosis: Principal Problem:   Bipolar affective disorder, current episode manic with psychotic symptoms (Natalie Brown) Active Problems:   Increased ammonia level   Valproic acid causing adverse effect in therapeutic use   Altered mental status   Past Psychiatric History: Probable bipolar disorder  Past Medical History:  Past Medical History:  Diagnosis Date   Bipolar 1 disorder with moderate mania ([place]) 12/30/2013   Hypertension    History reviewed. No pertinent surgical history. Family History: History reviewed. No pertinent family history. Family Psychiatric  History: Patient  reports mental illness runs in her father's side of the family Social History:  Social History   Substance and Sexual Activity  Alcohol Use No     Social History   Substance and Sexual Activity  Drug Use No    Social History   Socioeconomic History   Marital status: Married    Spouse name: Not on file   Number of children: Not on file   Years of education: Not on file   Highest education level: Not on file  Occupational History   Not on file  Tobacco Use   Smoking status: Every Day   Smokeless tobacco: Current  Substance and Sexual Activity   Alcohol use: No   Drug use: No   Sexual activity: Not on file  Other Topics Concern   Not on file  Social History Narrative   Not on file   Social Determinants of Health   Financial Resource Strain: Not on file  Food Insecurity: Not on file  Transportation Needs: Not on file  Physical Activity: Not on file  Stress: Not on file  Social Connections: Not on file   Additional Social History:   Sleep: Good  Appetite:  Good  Current Medications: Current Facility-Administered Medications  Medication Dose Route Frequency Provider Last Rate Last Admin   acetaminophen (TYLENOL) tablet 650 mg  650 mg Oral Q6H PRN Briant Cedar, MD   650 mg at 10/28/21 0757   alum & mag hydroxide-simeth (MAALOX/MYLANTA) 200-200-20 MG/5ML suspension 30 mL  30 mL Oral Q4H PRN Massengill, [name], MD       benztropine (COGENTIN) tablet 0.5 mg  0.5 mg Oral BID Armando Reichert, MD  0.5 mg at 11/06/21 0831   feeding supplement (KATE FARMS STANDARD 1.4) liquid 325 mL  325 mL Oral BID BM Massengill, [name], MD   325 mL at 11/06/21 1042   fluticasone (FLONASE) 50 MCG/ACT nasal spray 1 spray  1 spray Each Nare Daily Massengill, [name], MD   1 spray at 11/06/21 0831   haloperidol (HALDOL) tablet 10 mg  10 mg Oral QHS Harlow Asa, MD   10 mg at 11/05/21 2044   haloperidol (HALDOL) tablet 5 mg  5 mg Oral Daily Harlow Asa, MD   5 mg at 11/06/21  0831   hydrocerin (EUCERIN) cream   Topical BID Janine Limbo, MD   1 Application at 50/93/26 0831   hydrOXYzine (ATARAX) tablet 25 mg  25 mg Oral TID PRN Janine Limbo, MD   25 mg at 10/23/21 0802   LORazepam (ATIVAN) tablet 1 mg  1 mg Oral Q6H PRN Harlow Asa, MD   1 mg at 10/07/21 1546   magnesium hydroxide (MILK OF MAGNESIA) suspension 30 mL  30 mL Oral Daily PRN Janine Limbo, MD   30 mL at 11/05/21 7124   melatonin tablet 3 mg  3 mg Oral QHS Nicholes Rough, NP   3 mg at 11/05/21 2044   nicotine polacrilex (NICORETTE) gum 2 mg  2 mg Oral PRN Dian Situ, MD   2 mg at 11/01/21 0859   OLANZapine zydis (ZYPREXA) disintegrating tablet 5 mg  5 mg Oral Q8H PRN Harlow Asa, MD   5 mg at 10/14/21 1416   And   ziprasidone (GEODON) injection 20 mg  20 mg Intramuscular PRN Harlow Asa, MD       Lab Results:  Results for orders placed or performed during the hospital encounter of 09/06/21 (from the past 48 hour(s))  Comprehensive metabolic panel     Status: Abnormal   Collection Time: 11/06/21  6:34 AM  Result Value Ref Range   Sodium 142 135 - 145 mmol/L   Potassium 3.8 3.5 - 5.1 mmol/L   Chloride 106 98 - 111 mmol/L   CO2 27 22 - 32 mmol/L   Glucose, Bld 96 70 - 99 mg/dL    Comment: Glucose reference range applies only to samples taken after fasting for at least 8 hours.   BUN 20 6 - 20 mg/dL   Creatinine, Ser 1.22 (H) 0.44 - 1.00 mg/dL   Calcium 9.1 8.9 - 10.3 mg/dL   Total Protein 7.4 6.5 - 8.1 g/dL   Albumin 3.6 3.5 - 5.0 g/dL   AST 17 15 - 41 U/L   ALT 17 0 - 44 U/L   Alkaline Phosphatase 56 38 - 126 U/L   Total Bilirubin 0.4 0.3 - 1.2 mg/dL   GFR, Estimated 53 (L) >60 mL/min    Comment: (NOTE) Calculated using the CKD-EPI Creatinine Equation (2021)    Anion gap 9 5 - 15    Comment: Performed at [facility], [place] [address], [place], [place] [address]     Blood Alcohol level:  Lab Results  Component Value Date   ETH <10  83/38/2505   Metabolic Disorder Labs: Lab Results  Component Value Date   HGBA1C 5.3 09/08/2021   MPG 105.41 09/08/2021   No results found for: "PROLACTIN" Lab Results  Component Value Date   CHOL 173 09/08/2021   TRIG 110 09/08/2021   HDL 50 09/08/2021   CHOLHDL 3.5 09/08/2021   VLDL 22 09/08/2021   LDLCALC  101 (H) 09/08/2021   LDLCALC 102 (H) 06/21/2020   Physical Findings:  AIMS: Facial and Oral Movements Muscles of Facial Expression: None, normal Lips and Perioral Area: None, normal Jaw: None, normal Tongue: None, normal,Extremity Movements Upper (arms, wrists, hands, fingers): None, normal Lower (legs, knees, ankles, toes): None, normal, Trunk Movements Neck, shoulders, hips: None, normal, Overall Severity Severity of abnormal movements (highest score from questions above): None, normal Incapacitation due to abnormal movements: None, normal Patient's awareness of abnormal movements (rate only patient's report): No Awareness, Dental Status Current problems with teeth and/or dentures?: No Does patient usually wear dentures?: No  No Cogwheeling or Rigidity Present.  Musculoskeletal: Strength & Muscle Tone: within normal limits Gait & Station: normal Patient leans: N/A  Psychiatric Specialty Exam:  Presentation  General Appearance: unkempt appearing   Eye Contact:Fair  Speech:rambling but normal fluency  Speech Volume:Normal  Mood and Affect  Mood: appears anxious and at times irritable when discussing ADLs and clothes washing  Affect:anxious  Thought Process  Thought Processes: perseverative and ruminative  Orientation:Partial  Thought Content: Continues to have baseline, fixed delusions but does not mention these unless prompted with questions; is not grossly responding to internal/external stimuli on exam; denies SI, HI, AVH or paranoia and denies ideas of reference or first rank symptoms but is guarded and perseverative about need for continued  admission   Hallucinations:Hallucinations: None   Ideas of Reference:Denied  Suicidal Thoughts:Suicidal Thoughts: No   Homicidal Thoughts:Homicidal Thoughts: No   Sensorium  Memory:Limited   Judgment:Poor  Insight:Poor  Executive Functions  Concentration:Fair  Attention Span:Fair  Recall:Poor  Fund of Knowledge:Limited  Language:Fair  Psychomotor Activity  Psychomotor Activity:Psychomotor Activity: Normal   Assets  Assets:Resilience  Sleep  fair  Physical Exam Constitutional:      Appearance: the patient is not toxic-appearing.  Pulmonary:     Effort: Pulmonary effort is normal.  Neurological:     General: No focal deficit present.     Mental Status: the patient is alert and oriented to person, place, and time.   Review of Systems  Respiratory:  Negative for shortness of breath.   Cardiovascular:  Negative for chest pain.  Gastrointestinal:  Negative for abdominal pain, constipation, diarrhea, nausea and vomiting.  Neurological:  Negative for headaches.    Blood pressure 118/81, pulse (!) 107, temperature 98.1 F (36.7 C), temperature source Oral, resp. rate 16, height 5' 6"  (1.676 m), weight 72.6 kg, SpO2 98 %. Body mass index is 25.83 kg/m.  Treatment Plan Summary: Daily contact with patient to assess and evaluate symptoms and progress in treatment and Medication management  Safety and Monitoring: Involuntary admission to inpatient psychiatric unit for safety, stabilization and treatment Daily contact with patient to assess and evaluate symptoms and progress in treatment Patient's case to be discussed in multi-disciplinary team meeting Observation Level : q15 minute checks Vital signs: q12 hours Precautions: safety   Bipolar I MRE manic with psychotic features (r/o schizoaffective d/o - bipolar type)  -Continue Haldol 5 mg AM and 10 mg QHS for psychosis and mood stabilization  -Continue Melatonin 3 mg nightly for insomnia -Continue Cogentin  0.5 mg twice daily  -Invega Sustenna 234 mg IM given on 09-30-21 170m IM given 10/12/20 - due for next monthly IM injection on 11/09/21 -- Zyprexa/Geodon/ativan agitation protocol PR  -Additional psychosis labs (HIV- , RPR-, ANA-, heavy metal WNL, ESR wnl, ceruloplasmin wnl and CT head shows Marked for age brain atrophy.  No acute or reversible finding.)   -  Depakote, Ativan, and Zyprexa stopped    Hyperammonemia -resolved -Ammonia level 30 on 10/27/21 -Depakote was stopped    Mildly elevated creatinine  -Creatinine up to 1.33 -We will keep on trending as she will cooperate for labs and encouraging po intake    Season allergies -Continue Claritin 72m daily PRN    Urinary frequency- resolved -repeat UA with rare bacteria, but other wise, WNL  Discharge Planning: Social work and case management to assist with discharge planning and identification of hospital follow-up needs prior to discharge Estimated LOS: 5-7 days Discharge Concerns: Need to establish a safety plan; Medication compliance and effectiveness Discharge Goals: Return home with outpatient referrals for mental health follow-up including medication management/psychotherapy Labs On Admission- CMP: WNL except Cr: 1.1,  GFR: 60,  CBC: WNL,  UDS: Neg,  Troponin: 3,  EtOH: Neg,  beta hCG: 6.4,  Resp Panel: Neg 5/29- Urine Preg: Neg,  A1c: 5.3,  TSH: 1.719,  Lipid Panel: WNL except LDL: 101 5/24- BMP: WNL except Cr: 1.01,  Ca: 8.4,  hCG: 5,  Urine Preg: Neg 5/28- Ammonia: 32,  CBC: WNL,  CMP: WNL except  Cr: 1.08,  Ca: 8.6,  Depakote lvl: 20 6/3- Depakote lvl: 64, Hep Function: WNL,  CBC: WNL except Hem: 11.9 6/7- Depakote lvl: 75 6/18- UA: Neg,  CBC: WNL,  Depakote lvl: 136,  Ammonia: 99,  BMP: WNL except BUN: 22,  Cr: 1.19,  Ca: 8.6,  GFR: 54,  Hep Function: WNL except Albu: 3.3,  Indirect Bili: 0.2 6/20- ANA: Neg,  Ammonia: 48,  Ceruloplasmin: 24.2,  CMP: WNL except BUN: 23,  Cr: 1.35,  Ca: 8.3,  Albu: 3.4  HIV:Neg,  RPR: Neg,   Sed Rate: 6,  Depakote lvl: 25,  Heavy Metal: Neg 6/23- Ammonia: 46,  BMP: WNL except Cr: 1.2,  Ca: 8.3,  GFR: 54 6/24- UA: WNL except Bacteria: rare 6/30- CMP:  WNL except Cr: 1.04,  Ca: 8.6,  CBC: WNL,  Ammonia: 41, EKG: NSR with Qtc: 399 7/8 - CMP WNL other than CR 1.33 and GFR 48; Ammonia 30  NCorky Sox MD 11/06/2021, 3:29 PM

## 2021-11-06 NOTE — Progress Notes (Signed)
   11/06/21 0530  Sleep  Number of Hours 6.75

## 2021-11-06 NOTE — Progress Notes (Signed)
Adult Psychoeducational Group Note  Date:  11/06/2021 Time:  9:22 PM  Group Topic/Focus:  Wrap-Up Group:   The focus of this group is to help patients review their daily goal of treatment and discuss progress on daily workbooks.  Participation Level:  Did Not Attend  Participation Quality:   N/A  Affect:   N/A  Cognitive:   N/A  Insight: None  Engagement in Group:   N/A  Modes of Intervention:   N/A  Additional Comments:    Pt did not attend the Wrap Up group.  Edmund Hilda Nirvan Laban 11/06/2021, 9:22 PM

## 2021-11-06 NOTE — Progress Notes (Signed)
   11/06/21 1100  Psych Admission Type (Psych Patients Only)  Admission Status Involuntary  Psychosocial Assessment  Patient Complaints Confusion;Worrying  Eye Contact Fair  Facial Expression Anxious  Affect Anxious  Speech Soft  Interaction Assertive  Motor Activity Slow  Appearance/Hygiene Unremarkable  Behavior Characteristics Cooperative  Mood Anxious  Thought Process  Coherency Disorganized  Content Preoccupation  Delusions None reported or observed  Perception WDL  Hallucination None reported or observed  Judgment Impaired  Confusion Mild  Danger to Self  Current suicidal ideation? Denies  Danger to Others  Danger to Others None reported or observed

## 2021-11-06 NOTE — Group Note (Signed)
Recreation Therapy Group Note   Group Topic:Health and Wellness  Group Date: 11/06/2021 Start Time: 1000 End Time: 1040 Facilitators: Caroll Rancher, LRT,CTRS Location: [address] Dayroom   Goal Area(s) Addresses:  Patient will define components of whole wellness. Patient will verbalize benefit of whole wellness.   Group Description:  Exercise.  LRT and patients talked about the components of wellness and why they are important for the wellbeing of each individual.  LRT explained the group was going to do at least 30 minutes of exercise and they would take turns leading the group in an exercise of their choosing.  Water was provided for the patients, which they were encouraged to drink if they needed it.  Patients were also encouraged to take breaks if they were getting tired or overwhelmed.    Affect/Mood: Flat   Participation Level: None   Participation Quality: None   Behavior: Attentive    Speech/Thought Process: Disorganized   Insight: Lacking   Judgement: Lacking    Modes of Intervention: Music and Exercise   Patient Response to Interventions:  Attentive   Education Outcome:  Acknowledges education and In group clarification offered    Clinical Observations/Individualized Feedback: Pt came in late to group.  Pt did not participate.  Pt seemed to disconnected mentally.  Pt was just staring off when LRT attempted to engage pt to get her involved with the activity.    Plan: Continue to engage patient in RT group sessions 2-3x/week.   Caroll Rancher, LRT,CTRS 11/06/2021 10:56 AM

## 2021-11-06 NOTE — Progress Notes (Signed)
Pt was encouraged but didn't attend orientation/goals group. ?

## 2021-11-07 ENCOUNTER — Encounter (HOSPITAL_COMMUNITY): Payer: Self-pay

## 2021-11-07 DIAGNOSIS — F312 Bipolar disorder, current episode manic severe with psychotic features: Secondary | ICD-10-CM | POA: Diagnosis not present

## 2021-11-07 NOTE — Progress Notes (Signed)
Pt was encouraged but didn't attend orientation/goals group. ?

## 2021-11-07 NOTE — Progress Notes (Signed)
   11/07/21 1000  Psych Admission Type (Psych Patients Only)  Admission Status Involuntary  Psychosocial Assessment  Patient Complaints Confusion;Worrying  Eye Contact Fair  Facial Expression Anxious;Worried  Affect Anxious;Preoccupied  Training and development officer;Intrusive  Motor Activity Slow  Appearance/Hygiene Layered clothes;Disheveled  Behavior Characteristics Cooperative;Pacing  Mood Anxious;Preoccupied  Aggressive Behavior  Effect No apparent injury  Thought Process  Coherency Disorganized  Content Preoccupation  Delusions None reported or observed  Perception WDL  Hallucination None reported or observed  Judgment Impaired  Confusion Mild  Danger to Self  Current suicidal ideation? Denies  Danger to Others  Danger to Others None reported or observed

## 2021-11-07 NOTE — Progress Notes (Addendum)
[facility] LCSW Progress Note   11/07/2021    2:13 PM  Natalie Brown   [id number]   Type of Contact and Topic:  Care Coordination   CSW was present for patient's interim guardianship hearing. People in attendance include:  Natalie Brown - patient   [name] - petitioner/mother Leeroy Cha - guardian ad litem.  Natalie Brown - patient's sister  Keene Breath - Sharee Pimple  During this hearing, the judge has granted interim 45 day guardianship to [name], mother effective 11/07/2021. Permament guardianship hearing will be held on 01/09/22.   Leeroy Cha, attorney, requested the judge grant permanent guardianship at current hearing as all parties, to include patient, were in agreement that guardianship would be mutually beneficial. Sharee Pimple denied the motion. Attorney Leeroy Cha has agreed to request the special courts scheduler to move the permanent hearing to an earlier date   Additionally, Onnie Boer, [phone]) disability assistance coordinator at the [facility], has informed writer that the patient's application for disability has been submitted. Situation ongoing, CSW will continue to monitor and update note as more information becomes available.    Lastly, Ms Roxan Hockey from adult protective service has decided to close dependency case as a result of mother being awarded guardianship.     Signed:  [name], MSW, LCSWA, LCAS 11/07/2021 2:13 PM

## 2021-11-07 NOTE — BH IP Treatment Plan (Signed)
Interdisciplinary Treatment and Diagnostic Plan Update  11/07/2021 Time of Session: 10:50am  Natalie Brown MRN: [id number]  Principal Diagnosis: Bipolar affective disorder, current episode manic with psychotic symptoms (HCC)  Secondary Diagnoses: Principal Problem:   Bipolar affective disorder, current episode manic with psychotic symptoms (HCC) Active Problems:   Increased ammonia level   Valproic acid causing adverse effect in therapeutic use   Altered mental status   Current Medications:  Current Facility-Administered Medications  Medication Dose Route Frequency Provider Last Rate Last Admin   acetaminophen (TYLENOL) tablet 650 mg  650 mg Oral Q6H PRN Lauro Franklin, MD   650 mg at 10/28/21 0757   alum & mag hydroxide-simeth (MAALOX/MYLANTA) 200-200-20 MG/5ML suspension 30 mL  30 mL Oral Q4H PRN Massengill, Harrold Donath, MD       benztropine (COGENTIN) tablet 0.5 mg  0.5 mg Oral BID Karsten Ro, MD   0.5 mg at 11/07/21 0737   feeding supplement (KATE FARMS STANDARD 1.4) liquid 325 mL  325 mL Oral BID BM Massengill, Harrold Donath, MD   325 mL at 11/06/21 1401   fluticasone (FLONASE) 50 MCG/ACT nasal spray 1 spray  1 spray Each Nare Daily Massengill, Harrold Donath, MD   1 spray at 11/07/21 0737   haloperidol (HALDOL) tablet 10 mg  10 mg Oral QHS Bartholomew Crews E, MD   10 mg at 11/06/21 2053   haloperidol (HALDOL) tablet 5 mg  5 mg Oral Daily Comer Locket, MD   5 mg at 11/07/21 6387   hydrocerin (EUCERIN) cream   Topical BID Phineas Inches, MD   Given at 11/07/21 0737   hydrOXYzine (ATARAX) tablet 25 mg  25 mg Oral TID PRN Phineas Inches, MD   25 mg at 10/23/21 0802   LORazepam (ATIVAN) tablet 1 mg  1 mg Oral Q6H PRN Comer Locket, MD   1 mg at 10/07/21 1546   magnesium hydroxide (MILK OF MAGNESIA) suspension 30 mL  30 mL Oral Daily PRN Phineas Inches, MD   30 mL at 11/05/21 5643   melatonin tablet 3 mg  3 mg Oral QHS Nkwenti, Doris, NP   3 mg at 11/06/21 2053   nicotine  polacrilex (NICORETTE) gum 2 mg  2 mg Oral PRN Abbott Pao, Nadir, MD   2 mg at 11/01/21 0859   OLANZapine zydis (ZYPREXA) disintegrating tablet 5 mg  5 mg Oral Q8H PRN Bartholomew Crews E, MD   5 mg at 10/14/21 1416   And   ziprasidone (GEODON) injection 20 mg  20 mg Intramuscular PRN Comer Locket, MD       PTA Medications: Medications Prior to Admission  Medication Sig Dispense Refill Last Dose   CLARITIN 10 MG tablet Take 10 mg by mouth daily.      fluticasone (FLONASE) 50 MCG/ACT nasal spray Place 1 spray into both nostrils daily.      pseudoephedrine-guaifenesin (MUCINEX D) 60-600 MG 12 hr tablet Take 1 tablet by mouth every 12 (twelve) hours. 30 tablet 0     Patient Stressors: Health problems   Medication change or noncompliance    Patient Strengths: Capable of independent living  Printmaker for treatment/growth  Supportive family/friends   Treatment Modalities: Medication Management, Group therapy, Case management,  1 to 1 session with clinician, Psychoeducation, Recreational therapy.   Physician Treatment Plan for Primary Diagnosis: Bipolar affective disorder, current episode manic with psychotic symptoms (HCC) Long Term Goal(s): Improvement in symptoms so as ready for discharge   Short Term Goals:  Ability to identify and develop effective coping behaviors will improve Ability to maintain clinical measurements within normal limits will improve Compliance with prescribed medications will improve Ability to identify changes in lifestyle to reduce recurrence of condition will improve Ability to verbalize feelings will improve Ability to disclose and discuss suicidal ideas Ability to demonstrate self-control will improve  Medication Management: Evaluate patient's response, side effects, and tolerance of medication regimen.  Therapeutic Interventions: 1 to 1 sessions, Unit Group sessions and Medication administration.  Evaluation of Outcomes:  Progressing  Physician Treatment Plan for Secondary Diagnosis: Principal Problem:   Bipolar affective disorder, current episode manic with psychotic symptoms (HCC) Active Problems:   Increased ammonia level   Valproic acid causing adverse effect in therapeutic use   Altered mental status  Long Term Goal(s): Improvement in symptoms so as ready for discharge   Short Term Goals: Ability to identify and develop effective coping behaviors will improve Ability to maintain clinical measurements within normal limits will improve Compliance with prescribed medications will improve Ability to identify changes in lifestyle to reduce recurrence of condition will improve Ability to verbalize feelings will improve Ability to disclose and discuss suicidal ideas Ability to demonstrate self-control will improve     Medication Management: Evaluate patient's response, side effects, and tolerance of medication regimen.  Therapeutic Interventions: 1 to 1 sessions, Unit Group sessions and Medication administration.  Evaluation of Outcomes: Progressing   RN Treatment Plan for Primary Diagnosis: Bipolar affective disorder, current episode manic with psychotic symptoms (HCC) Long Term Goal(s): Knowledge of disease and therapeutic regimen to maintain health will improve  Short Term Goals: Ability to remain free from injury will improve, Ability to participate in decision making will improve, Ability to verbalize feelings will improve, Ability to disclose and discuss suicidal ideas, and Ability to identify and develop effective coping behaviors will improve  Medication Management: RN will administer medications as ordered by provider, will assess and evaluate patient's response and provide education to patient for prescribed medication. RN will report any adverse and/or side effects to prescribing provider.  Therapeutic Interventions: 1 on 1 counseling sessions, Psychoeducation, Medication administration,  Evaluate responses to treatment, Monitor vital signs and CBGs as ordered, Perform/monitor CIWA, COWS, AIMS and Fall Risk screenings as ordered, Perform wound care treatments as ordered.  Evaluation of Outcomes: Progressing   LCSW Treatment Plan for Primary Diagnosis: Bipolar affective disorder, current episode manic with psychotic symptoms (HCC) Long Term Goal(s): Safe transition to appropriate next level of care at discharge, Engage patient in therapeutic group addressing interpersonal concerns.  Short Term Goals: Engage patient in aftercare planning with referrals and resources, Increase social support, Increase emotional regulation, Facilitate acceptance of mental health diagnosis and concerns, Identify triggers associated with mental health/substance abuse issues, and Increase skills for wellness and recovery  Therapeutic Interventions: Assess for all discharge needs, 1 to 1 time with Social worker, Explore available resources and support systems, Assess for adequacy in community support network, Educate family and significant other(s) on suicide prevention, Complete Psychosocial Assessment, Interpersonal group therapy.  Evaluation of Outcomes: Progressing   Progress in Treatment: Attending groups: Yes. Participating in groups: Yes. Taking medication as prescribed: Yes. Toleration medication: Yes. Family/Significant other contact made: Yes, individual(s) contacted:  SPE completed with [name], mother.  Patient understands diagnosis: Yes. Discussing patient identified problems/goals with staff: Yes. Medical problems stabilized or resolved: Yes. Denies suicidal/homicidal ideation: Yes. Issues/concerns per patient self-inventory: Yes. Other: none   New problem(s) identified: No, Describe:  none  New Short Term/Long Term Goal(s): Patient to work towards elimination of symptoms of psychosis, medication management for mood stabilization; development of comprehensive mental wellness  plan.   Patient Goals:  No additional goals identified at this time. Patient to continue to work towards original goals identified in initial treatment team meeting. CSW will remain available to patient should they voice additional treatment goals.    Discharge Plan or Barriers: Patient continues to lack adequate housing and supervision given baseline confusion, disorganized thought/behaviors, and mania. Medicaid, disability, and guardianship in process; awaiting pending applications.      Reason for Continuation of Hospitalization: Delusions , altered mental status   Estimated Length of Stay: TBD   Last 3 [place] Suicide Severity Risk Score: Flowsheet Row Ad[facility] (Current) [facility] from [facility]05[facility]2021 in [facility] ED from 05/26/2020 in [facility]  C-SSRS RISK CATEGORY High Risk High Risk No Risk       Last PHQ 2/9 Scores:    05/26/2020    2:14 PM 09/03/2017    4:34 PM 08/07/2016    2:12 PM  Depression screen PHQ 2/9  Decreased Interest 3 3 3   Down, Depressed, Hopeless 3 1 2   PHQ - 2 Score 6 4 5   Altered sleeping 0 3 3  Tired, decreased energy 0 3 0  Change in appetite 3 3 0  Feeling bad or failure about yourself  0 3 1  Trouble concentrating 3 0 0  Moving slowly or fidgety/restless 3 0 1  Suicidal thoughts 0 0 0  PHQ-9 Score 15 16 10     Scribe for Treatment Team: , 11/07/2021 10:58 AM

## 2021-11-07 NOTE — Progress Notes (Signed)
   11/07/21 2215  Psych Admission Type (Psych Patients Only)  Admission Status Involuntary  Psychosocial Assessment  Patient Complaints Worrying;Confusion  Eye Contact Fair  Facial Expression Anxious  Affect Anxious  Speech Soft  Interaction Assertive  Motor Activity Slow  Appearance/Hygiene Disheveled  Behavior Characteristics Cooperative  Mood Preoccupied  Aggressive Behavior  Effect No apparent injury  Thought Process  Coherency Disorganized  Content Preoccupation  Delusions None reported or observed  Perception WDL  Hallucination None reported or observed  Judgment Limited  Confusion Mild  Danger to Self  Current suicidal ideation? Denies

## 2021-11-07 NOTE — Group Note (Signed)
Recreation Therapy Group Note   Group Topic:Leisure Education  Group Date: 11/07/2021 Start Time: 1005 End Time: 1050 Facilitators: Caroll Rancher, LRT,CTRS Location: 500 [address] Dayroom   Goal Area(s) Addresses:  Patient will successfully identify positive leisure and recreation activities.  Patient will acknowledge benefits of participation in healthy leisure activities post discharge.  Patient will actively work with peers toward a shared goal.   Group Description: Pictionary. In groups of 5-7, patients took turns trying to guess the picture being drawn on the board by their teammate.  If the team guessed the correct answer, they won a point.  If the team guessed wrong, the other team got a chance to steal the point. After several rounds of game play, the team with the most points were declared winners. Post-activity discussion reviewed benefits of positive recreation outlets: reducing stress, improving coping mechanisms, increasing self-esteem, and building larger support systems.   Affect/Mood: N/A   Participation Level: Did not attend    Clinical Observations/Individualized Feedback:     Plan: Continue to engage patient in RT group sessions 2-3x/week.   Caroll Rancher, LRT,CTRS 11/07/2021 1:34 PM

## 2021-11-07 NOTE — Progress Notes (Signed)
[facility] MD Progress Note  11/07/2021 5:50 PM Natalie Brown  MRN:  [id number]  Reason For Admission: Johnny Latu is a 56 yr old female who presented on 5/17 to [facility] with complaints of anxiety and feeling overwhelmed and SI without a plan, she was admitted to [facility] on 5/19.  PPHx is significant for possible Bipolar Disorder.   Yesterday, the psychiatry team made following recommendations: No changes  Interval History: PRN Medications administered within the last 24 hours: none Per nursing staff: patient has exhibited flat affect and a disorganized thought process.    Per Patient:  On assessment today, the patient reports an " okay" mood.  She states "God is with me".  When asked what this means, the patient says "I know what you are talking about".  She again shows this interviewer a copy of her involuntary commitment paperwork.  She says "this is a copy" over and over again.  Upcoming guardianship hearing discussed with the patient.  No questions at this time.  The patient denies suicidal or homicidal thoughts.  She denies auditory or visual hallucinations.  Patient denies side effects to current scheduled psychiatric medications.   Patient denies other somatic complaints.    Principal Problem: Bipolar affective disorder, current episode manic with psychotic symptoms ([place]) Diagnosis: Principal Problem:   Bipolar affective disorder, current episode manic with psychotic symptoms ([place]) Active Problems:   Increased ammonia level   Valproic acid causing adverse effect in therapeutic use   Altered mental status   Past Psychiatric History: Probable bipolar disorder  Past Medical History:  Past Medical History:  Diagnosis Date   Bipolar 1 disorder with moderate mania ([place]) 12/30/2013   Hypertension    History reviewed. No pertinent surgical history. Family History: History reviewed. No pertinent family history. Family Psychiatric  History: Patient reports mental illness runs in her  father's side of the family Social History:  Social History   Substance and Sexual Activity  Alcohol Use No     Social History   Substance and Sexual Activity  Drug Use No    Social History   Socioeconomic History   Marital status: Married    Spouse name: Not on file   Number of children: Not on file   Years of education: Not on file   Highest education level: Not on file  Occupational History   Not on file  Tobacco Use   Smoking status: Every Day   Smokeless tobacco: Current  Substance and Sexual Activity   Alcohol use: No   Drug use: No   Sexual activity: Not on file  Other Topics Concern   Not on file  Social History Narrative   Not on file   Social Determinants of Health   Financial Resource Strain: Not on file  Food Insecurity: Not on file  Transportation Needs: Not on file  Physical Activity: Not on file  Stress: Not on file  Social Connections: Not on file   Additional Social History:   Sleep: Good  Appetite:  Good  Current Medications: Current Facility-Administered Medications  Medication Dose Route Frequency Provider Last Rate Last Admin   acetaminophen (TYLENOL) tablet 650 mg  650 mg Oral Q6H PRN Briant Cedar, MD   650 mg at 10/28/21 0757   alum & mag hydroxide-simeth (MAALOX/MYLANTA) 200-200-20 MG/5ML suspension 30 mL  30 mL Oral Q4H PRN Massengill, [name], MD       benztropine (COGENTIN) tablet 0.5 mg  0.5 mg Oral BID Armando Reichert, MD  0.5 mg at 11/07/21 0737   feeding supplement (KATE FARMS STANDARD 1.4) liquid 325 mL  325 mL Oral BID BM Massengill, [name], MD   325 mL at 11/07/21 1439   fluticasone (FLONASE) 50 MCG/ACT nasal spray 1 spray  1 spray Each Nare Daily Massengill, [name], MD   1 spray at 11/07/21 0737   haloperidol (HALDOL) tablet 10 mg  10 mg Oral QHS Harlow Asa, MD   10 mg at 11/06/21 2053   haloperidol (HALDOL) tablet 5 mg  5 mg Oral Daily Harlow Asa, MD   5 mg at 11/07/21 7341   hydrocerin (EUCERIN) cream    Topical BID Janine Limbo, MD   Given at 11/07/21 1618   hydrOXYzine (ATARAX) tablet 25 mg  25 mg Oral TID PRN Janine Limbo, MD   25 mg at 10/23/21 0802   LORazepam (ATIVAN) tablet 1 mg  1 mg Oral Q6H PRN Harlow Asa, MD   1 mg at 10/07/21 1546   magnesium hydroxide (MILK OF MAGNESIA) suspension 30 mL  30 mL Oral Daily PRN Massengill, [name], MD   30 mL at 11/07/21 1440   melatonin tablet 3 mg  3 mg Oral QHS Nicholes Rough, NP   3 mg at 11/06/21 2053   nicotine polacrilex (NICORETTE) gum 2 mg  2 mg Oral PRN Dian Situ, MD   2 mg at 11/01/21 0859   OLANZapine zydis (ZYPREXA) disintegrating tablet 5 mg  5 mg Oral Q8H PRN Harlow Asa, MD   5 mg at 10/14/21 1416   And   ziprasidone (GEODON) injection 20 mg  20 mg Intramuscular PRN Harlow Asa, MD       Lab Results:  Results for orders placed or performed during the hospital encounter of 09/06/21 (from the past 48 hour(s))  Comprehensive metabolic panel     Status: Abnormal   Collection Time: 11/06/21  6:34 AM  Result Value Ref Range   Sodium 142 135 - 145 mmol/L   Potassium 3.8 3.5 - 5.1 mmol/L   Chloride 106 98 - 111 mmol/L   CO2 27 22 - 32 mmol/L   Glucose, Bld 96 70 - 99 mg/dL    Comment: Glucose reference range applies only to samples taken after fasting for at least 8 hours.   BUN 20 6 - 20 mg/dL   Creatinine, Ser 1.22 (H) 0.44 - 1.00 mg/dL   Calcium 9.1 8.9 - 10.3 mg/dL   Total Protein 7.4 6.5 - 8.1 g/dL   Albumin 3.6 3.5 - 5.0 g/dL   AST 17 15 - 41 U/L   ALT 17 0 - 44 U/L   Alkaline Phosphatase 56 38 - 126 U/L   Total Bilirubin 0.4 0.3 - 1.2 mg/dL   GFR, Estimated 53 (L) >60 mL/min    Comment: (NOTE) Calculated using the CKD-EPI Creatinine Equation (2021)    Anion gap 9 5 - 15    Comment: Performed at [facility], [place] [address], [place], [place] [address]     Blood Alcohol level:  Lab Results  Component Value Date   ETH <10 24/12/7351   Metabolic Disorder Labs: Lab  Results  Component Value Date   HGBA1C 5.3 09/08/2021   MPG 105.41 09/08/2021   No results found for: "PROLACTIN" Lab Results  Component Value Date   CHOL 173 09/08/2021   TRIG 110 09/08/2021   HDL 50 09/08/2021   CHOLHDL 3.5 09/08/2021   VLDL 22 09/08/2021   LDLCALC 101 (  H) 09/08/2021   LDLCALC 102 (H) 06/21/2020   Physical Findings:  AIMS: Facial and Oral Movements Muscles of Facial Expression: None, normal Lips and Perioral Area: None, normal Jaw: None, normal Tongue: None, normal,Extremity Movements Upper (arms, wrists, hands, fingers): None, normal Lower (legs, knees, ankles, toes): None, normal, Trunk Movements Neck, shoulders, hips: None, normal, Overall Severity Severity of abnormal movements (highest score from questions above): None, normal Incapacitation due to abnormal movements: None, normal Patient's awareness of abnormal movements (rate only patient's report): No Awareness, Dental Status Current problems with teeth and/or dentures?: No Does patient usually wear dentures?: No  No Cogwheeling or Rigidity Present.  Musculoskeletal: Strength & Muscle Tone: within normal limits Gait & Station: normal Patient leans: N/A  Psychiatric Specialty Exam:  Presentation  General Appearance: unkempt appearing   Eye Contact:Fair  Speech:rambling but normal fluency  Speech Volume:Normal  Mood and Affect  Mood: appears anxious and at times irritable when discussing ADLs and clothes washing  Affect:anxious  Thought Process  Thought Processes: perseverative and ruminative  Orientation:Partial  Thought Content: Continues to have baseline, fixed delusions but does not mention these unless prompted with questions; is not grossly responding to internal/external stimuli on exam; denies SI, HI, AVH or paranoia and denies ideas of reference or first rank symptoms but is guarded and perseverative about need for continued admission   Hallucinations:No data  recorded   Ideas of Reference:Denied  Suicidal Thoughts:No data recorded   Homicidal Thoughts:No data recorded   Sensorium  Memory:Limited   Judgment:Poor  Insight:Poor  Executive Functions  Concentration:Fair  Attention Span:Fair  Recall:Poor  Fund of Knowledge:Limited  Language:Fair  Psychomotor Activity  Psychomotor Activity:No data recorded   Assets  Assets:Resilience  Sleep  fair  Physical Exam Constitutional:      Appearance: the patient is not toxic-appearing.  Pulmonary:     Effort: Pulmonary effort is normal.  Neurological:     General: No focal deficit present.     Mental Status: the patient is alert and oriented to person, place, and time.   Review of Systems  Respiratory:  Negative for shortness of breath.   Cardiovascular:  Negative for chest pain.  Gastrointestinal:  Negative for abdominal pain, constipation, diarrhea, nausea and vomiting.  Neurological:  Negative for headaches.    Blood pressure 137/81, pulse (!) 128, temperature 98 F (36.7 C), temperature source Oral, resp. rate 16, height 5' 6" (1.676 m), weight 72.6 kg, SpO2 100 %. Body mass index is 25.83 kg/m.  Treatment Plan Summary: Daily contact with patient to assess and evaluate symptoms and progress in treatment and Medication management  Safety and Monitoring: Involuntary admission to inpatient psychiatric unit for safety, stabilization and treatment Daily contact with patient to assess and evaluate symptoms and progress in treatment Patient's case to be discussed in multi-disciplinary team meeting Observation Level : q15 minute checks Vital signs: q12 hours Precautions: safety   Bipolar I MRE manic with psychotic features (r/o schizoaffective d/o - bipolar type)  -Continue Haldol 5 mg AM and 10 mg QHS for psychosis and mood stabilization  -Continue Melatonin 3 mg nightly for insomnia -Continue Cogentin 0.5 mg twice daily  -Invega Sustenna 234 mg IM given on  09-30-21 156mg IM given 10/12/20 - due for next monthly IM injection on 11/09/21 -- Zyprexa/Geodon/ativan agitation protocol PR  -Additional psychosis labs (HIV- , RPR-, ANA-, heavy metal WNL, ESR wnl, ceruloplasmin wnl and CT head shows Marked for age brain atrophy.  No acute or reversible finding.)   -  Depakote, Ativan, and Zyprexa stopped    Hyperammonemia -resolved -Ammonia level 30 on 10/27/21 -Depakote was stopped    Mildly elevated creatinine  -Creatinine up to 1.33 -We will keep on trending as she will cooperate for labs and encouraging po intake    Season allergies -Continue Claritin 8m daily PRN    Urinary frequency- resolved -repeat UA with rare bacteria, but other wise, WNL  Discharge Planning: Social work and case management to assist with discharge planning and identification of hospital follow-up needs prior to discharge Estimated LOS: 5-7 days Discharge Concerns: Need to establish a safety plan; Medication compliance and effectiveness Discharge Goals: Return home with outpatient referrals for mental health follow-up including medication management/psychotherapy Labs On Admission- CMP: WNL except Cr: 1.1,  GFR: 60,  CBC: WNL,  UDS: Neg,  Troponin: 3,  EtOH: Neg,  beta hCG: 6.4,  Resp Panel: Neg 5/29- Urine Preg: Neg,  A1c: 5.3,  TSH: 1.719,  Lipid Panel: WNL except LDL: 101 5/24- BMP: WNL except Cr: 1.01,  Ca: 8.4,  hCG: 5,  Urine Preg: Neg 5/28- Ammonia: 32,  CBC: WNL,  CMP: WNL except  Cr: 1.08,  Ca: 8.6,  Depakote lvl: 20 6/3- Depakote lvl: 64, Hep Function: WNL,  CBC: WNL except Hem: 11.9 6/7- Depakote lvl: 75 6/18- UA: Neg,  CBC: WNL,  Depakote lvl: 136,  Ammonia: 99,  BMP: WNL except BUN: 22,  Cr: 1.19,  Ca: 8.6,  GFR: 54,  Hep Function: WNL except Albu: 3.3,  Indirect Bili: 0.2 6/20- ANA: Neg,  Ammonia: 48,  Ceruloplasmin: 24.2,  CMP: WNL except BUN: 23,  Cr: 1.35,  Ca: 8.3,  Albu: 3.4  HIV:Neg,  RPR: Neg,  Sed Rate: 6,  Depakote lvl: 25,  Heavy Metal: Neg 6/23-  Ammonia: 46,  BMP: WNL except Cr: 1.2,  Ca: 8.3,  GFR: 54 6/24- UA: WNL except Bacteria: rare 6/30- CMP:  WNL except Cr: 1.04,  Ca: 8.6,  CBC: WNL,  Ammonia: 41, EKG: NSR with Qtc: 399 7/8 - CMP WNL other than CR 1.33 and GFR 48; Ammonia 30  NCorky Sox MD 11/07/2021, 5:50 PM

## 2021-11-07 NOTE — Progress Notes (Signed)
   11/07/21 0530  Sleep  Number of Hours 6.5

## 2021-11-07 NOTE — Group Note (Signed)
[facility] LCSW Group Therapy Note  Date/Time: 11/07/2021 @1pm    Type of Therapy and Topic:  Group Therapy:  Strengths and Qualities  Participation Level:  Did not attend  Description of Group:    In this group patients will be asked to explore and define the terms strength ans qualities.  Patients will be guided to discuss their thoughts, feelings, and behaviors as to where strengths and qualities originate. Participants will then list some of their strengths and qualities related to each subject topic. This group will be process-oriented, with patients participating in exploration of their own experiences as well as giving and receiving support and challenge from other group members.  Therapeutic Goals: Patient will identify specific strengths related to their personal life. Patient will identify feelings, thoughts, and beliefs about strengths and qualities. Patient will identify ways their strengths have been used. . Patient will identify situations where they have helped others or made someone else happy. .  Summary of Patient Progress Did not attend     Therapeutic Modalities:   Cognitive Behavioral Therapy Solution Focused Therapy Motivational Interviewing Brief Therapy   Lyal Husted, LCSW, LCAS Clincal Social Worker  [facility]

## 2021-11-07 NOTE — Progress Notes (Signed)
Adult Psychoeducational Group Note  Date:  11/07/2021 Time:  10:07 PM  Group Topic/Focus:  Wrap-Up Group:   The focus of this group is to help patients review their daily goal of treatment and discuss progress on daily workbooks.  Participation Level:  Minimal  Participation Quality:  Appropriate  Affect:  Appropriate  Cognitive:  Disorganized and Confused  Insight: Appropriate  Engagement in Group:  Limited  Modes of Intervention:  Discussion  Additional Comments:   Pt stated her goal for today was to focus on her treatment plan. Pt stated she accomplished her goal today. Pt stated she did not get a chance to speak with a doctor but did speak with her social worker about her care today. Pt rated her overall day a 5 out of 10. Pt stated she was able to contact her mother and her mother coming for visitation tonight  improved her overall day. Pt stated she felt better about herself tonight. Pt stated she was able to attend all meals but dinner today.  Pt stated she took all medications provided today. Pt stated her appetite was fair today. Pt rated her sleep last night was pretty good. Pt stated the goal tonight was to get some rest. Pt stated she had no physical pain tonight. Pt deny visual hallucinations and auditory issues tonight. Pt denies thoughts of harming herself or others. Pt stated she would alert staff if anything changed.    Felipa Furnace 11/07/2021, 10:07 PM

## 2021-11-08 DIAGNOSIS — F312 Bipolar disorder, current episode manic severe with psychotic features: Secondary | ICD-10-CM | POA: Diagnosis not present

## 2021-11-08 MED ORDER — PALIPERIDONE PALMITATE ER 78 MG/0.5ML IM SUSY
78.0000 mg | PREFILLED_SYRINGE | Freq: Once | INTRAMUSCULAR | Status: AC
Start: 2021-11-09 — End: 2021-11-09
  Administered 2021-11-09: 78 mg via INTRAMUSCULAR
  Filled 2021-11-08: qty 78

## 2021-11-08 MED ORDER — DOCUSATE SODIUM 100 MG PO CAPS
100.0000 mg | ORAL_CAPSULE | Freq: Every day | ORAL | Status: DC
  Administered 2021-11-08 – 2021-11-09 (×2): 100 mg via ORAL
  Filled 2021-11-08 (×5): qty 1

## 2021-11-08 NOTE — Group Note (Signed)
Recreation Therapy Group Note   Group Topic:Communication  Group Date: 11/08/2021 Start Time: 1000 End Time: 1040 Facilitators: Caroll Rancher, LRT,CTRS Location: [address] Dayroom   Goal Area(s) Addresses:  Patient will effectively work with peer towards shared goal.  Patient will identify skills used to make activity successful.  Patient will identify how skills used during activity can be applied to reach post d/c goals.    Group Description: [employer]. In teams of 5-6, patients were given 12 craft pipe cleaners. Using the materials provided, patients were instructed to compete again the opposing team(s) to build the tallest free-standing structure from floor level. The activity was timed; difficulty increased by Clinical research associate as Production designer, theatre/television/film continued.  Systematically resources were removed with additional directions for example, placing one arm behind their back, working in silence, and shape stipulations. LRT facilitated post-activity discussion reviewing team processes and necessary communication skills involved in completion. Patients were encouraged to reflect how the skills utilized, or not utilized, in this activity can be incorporated to positively impact support systems post discharge.   Affect/Mood: Flat   Participation Level: N/A   Participation Quality: N/A   Behavior: Disinterested   Speech/Thought Process: Rational   Insight: N/A   Judgement: N/A   Modes of Intervention: STEM Activity   Patient Response to Interventions:  Disengaged   Education Outcome:  Acknowledges education and In group clarification offered    Clinical Observations/Individualized Feedback: Pt came to group, made some coffee and stayed for a few minutes before leaving and not returning.    Plan: Continue to engage patient in RT group sessions 2-3x/week.   Caroll Rancher, LRT,CTRS 11/08/2021 11:18 AM

## 2021-11-08 NOTE — [facility] Group Notes (Signed)
Adult Psychoeducational Group Note  Date:  11/08/2021 Time:  10:24 PM  Group Topic/Focus:  Wrap-Up Group:   The focus of this group is to help patients review their daily goal of treatment and discuss progress on daily workbooks.  Participation Level:  Active  Participation Quality:  Appropriate  Affect:  Appropriate  Cognitive:  Appropriate  Insight: Appropriate  Engagement in Group:  Engaged  Modes of Intervention:  Discussion  Additional Comments:  Patient attended and participated in the Wrap-up group.  [name] 11/08/2021, 10:24 PM

## 2021-11-08 NOTE — Progress Notes (Signed)
   11/08/21 0515  Sleep  Number of Hours 7

## 2021-11-08 NOTE — Progress Notes (Signed)
   11/08/21 2215  Psych Admission Type (Psych Patients Only)  Admission Status Involuntary  Psychosocial Assessment  Patient Complaints Confusion;Worrying  Eye Contact Fair  Facial Expression Anxious  Affect Anxious  Speech Soft  Interaction Assertive  Motor Activity Slow  Appearance/Hygiene Disheveled  Behavior Characteristics Cooperative  Mood Preoccupied  Aggressive Behavior  Effect No apparent injury  Thought Process  Coherency Disorganized  Content Preoccupation  Delusions None reported or observed  Perception WDL  Hallucination None reported or observed  Judgment Limited  Confusion Mild  Danger to Self  Current suicidal ideation? Denies

## 2021-11-08 NOTE — Progress Notes (Signed)
Pt was encouraged but didn't attend orientation/goals group. ?

## 2021-11-08 NOTE — Progress Notes (Addendum)
[facility] LCSW Progress Note   11/08/2021    3:09 PM  Natalie Brown   [id number]   Type of Contact and Topic: Care Coordination   CSW met with Patient's mother/guardian who presented guardianship documents (paperwork placed in patient's chart, legal guardianship flag placed in EPIC).   Discussed discharge options for patient; during this meeting, Patient's sister Natalie Brown has tentatively agreed to allow patient to temporarily stay at her residence until the patient's funding is secure to have her stay at group home. Sister agrees to provide adequate supervision of patient while residing at her home.  Patient's mother/guardian is in agreement with this plan. CSW encouraged patient's family to seek immediate medical care should the patient decompensate once discharged.   CSW discussed plan of care with Amy Singleton, MD and Nick Gabriel, MD who are in agreement with plan.   CSW discussed plan with Melissa Hornbeck who agrees to be present for family meeting prior to discharge.   [facility] ACTT referral has been placed on behalf of patient's guardian to be funded by IPRS funding until disability is secured.   Situation ongoing, CSW will continue to monitor and update note as more information becomes available. Writer will remain available to family should place change.       Signed:   W. , MSW, LCSWA, LCAS 11/08/2021 3:09 PM     

## 2021-11-08 NOTE — Group Note (Signed)
Occupational Therapy Group Note   Group Topic:Goal Setting  Group Date: 11/08/2021 Start Time: 1415 End Time: 1515 Facilitators: Ted Mcalpine, OT   Group Description: Group encouraged engagement and participation through discussion focused on goal setting. Group members were introduced to goal-setting using the SMART Goal framework, identifying goals as Specific, Measureable, Acheivable, Relevant, and Time-Bound. Group members took time from group to create their own personal goal reflecting the SMART goal template and shared for review by peers and OT.    Therapeutic Goal(s):  Identify at least one goal that fits the SMART framework    Participation Level: Active   Participation Quality: Moderate Cues   Behavior: Cooperative   Speech/Thought Process: Disorganized, Loose association , and Tangential    Affect/Mood: Appropriate   Insight: Poor   Judgement: Poor   Individualization: pt was active in their participation of group discussion/activity. New skills were identified  Modes of Intervention: Discussion and Education  Patient Response to Interventions:  Attentive and Interested    Plan: Continue to engage patient in OT groups 2 - 3x/week.  11/08/2021  Ted Mcalpine, OT Kerrin Champagne, OT

## 2021-11-08 NOTE — Progress Notes (Addendum)
[facility] MD Progress Note  11/08/2021 4:04 PM Natalie Brown  MRN:  [id number]  Reason For Admission: Natalie Brown is a 56 yr old female who presented on 5/17 to [facility] with complaints of anxiety and feeling overwhelmed and SI without a plan, she was admitted to [facility] on 5/19.  PPHx is significant for possible Bipolar Disorder.  Yesterday, the psychiatry team made following recommendations: No changes  Interval History: PRN Medications administered within the last 24 hours: milk of magnesia x1 Per nursing staff: patient has been pre-occupied and discheveled but cooperative  Per Patient:  On assessment today, the patient reports that she was "excused from court" yesterday.  [name] asked about the different languages that she speaks and she states that they are "friends" and proceeds to list several languages very quickly and an odd accent.  She reports that she has "6 diabetic certifications".  She also states that she has a federal phone and it appears this is very important to her.  The patient reports that she is sleeping and eating well.  She denies ideas of reference and first rank symptoms.  She denies auditory visual hallucinations.  She denies suicidal thoughts.  Patient denies side effects to current scheduled psychiatric medications.   Patient denies other somatic complaints.  Discussed patient's care with legal guardian, patient's mother.  Has many questions about the patient's medicines, but she agrees with the plan to administer another Invega injection on 7/21.  Principal Problem: Bipolar affective disorder, current episode manic with psychotic symptoms ([place]) Diagnosis: Principal Problem:   Bipolar affective disorder, current episode manic with psychotic symptoms ([place]) Active Problems:   Increased ammonia level   Valproic acid causing adverse effect in therapeutic use   Altered mental status   Past Psychiatric History: Probable bipolar disorder  Past Medical History:  Past  Medical History:  Diagnosis Date   Bipolar 1 disorder with moderate mania ([place]) 12/30/2013   Hypertension    Family History: see H&P  Family Psychiatric  History: Patient reports mental illness runs in her father's side of the family  Social History:  Social History   Substance and Sexual Activity  Alcohol Use No     Social History   Substance and Sexual Activity  Drug Use No    Social History   Socioeconomic History   Marital status: Married    Spouse name: Not on file   Number of children: Not on file   Years of education: Not on file   Highest education level: Not on file  Occupational History   Not on file  Tobacco Use   Smoking status: Every Day   Smokeless tobacco: Current  Substance and Sexual Activity   Alcohol use: No   Drug use: No   Sexual activity: Not on file  Other Topics Concern   Not on file  Social History Narrative   Not on file   Social Determinants of Health   Financial Resource Strain: Not on file  Food Insecurity: Not on file  Transportation Needs: Not on file  Physical Activity: Not on file  Stress: Not on file  Social Connections: Not on file   Additional Social History:   Sleep: Good  Appetite:  Good  Current Medications: Current Facility-Administered Medications  Medication Dose Route Frequency Provider Last Rate Last Admin   acetaminophen (TYLENOL) tablet 650 mg  650 mg Oral Q6H PRN Briant Cedar, MD   650 mg at 10/28/21 0757   alum & mag hydroxide-simeth (MAALOX/MYLANTA)  200-200-20 MG/5ML suspension 30 mL  30 mL Oral Q4H PRN Massengill, [name], MD       benztropine (COGENTIN) tablet 0.5 mg  0.5 mg Oral BID Armando Reichert, MD   0.5 mg at 11/08/21 6606   docusate sodium (COLACE) capsule 100 mg  100 mg Oral Daily Nelda Marseille, Danyel Tobey E, MD       feeding supplement (KATE FARMS STANDARD 1.4) liquid 325 mL  325 mL Oral BID BM Massengill, [name], MD   325 mL at 11/08/21 0907   fluticasone (FLONASE) 50 MCG/ACT nasal spray 1 spray  1  spray Each Nare Daily Massengill, [name], MD   1 spray at 11/08/21 3016   haloperidol (HALDOL) tablet 10 mg  10 mg Oral QHS Nelda Marseille, Kelilah Hebard E, MD   10 mg at 11/07/21 2030   haloperidol (HALDOL) tablet 5 mg  5 mg Oral Daily Harlow Asa, MD   5 mg at 11/08/21 0109   hydrocerin (EUCERIN) cream   Topical BID Janine Limbo, MD   1 Application at 32/35/57 3220   hydrOXYzine (ATARAX) tablet 25 mg  25 mg Oral TID PRN Janine Limbo, MD   25 mg at 10/23/21 0802   LORazepam (ATIVAN) tablet 1 mg  1 mg Oral Q6H PRN Harlow Asa, MD   1 mg at 10/07/21 1546   magnesium hydroxide (MILK OF MAGNESIA) suspension 30 mL  30 mL Oral Daily PRN Massengill, [name], MD   30 mL at 11/07/21 1440   melatonin tablet 3 mg  3 mg Oral QHS Nkwenti, Doris, NP   3 mg at 11/07/21 2030   nicotine polacrilex (NICORETTE) gum 2 mg  2 mg Oral PRN Winfred Leeds, Nadir, MD   2 mg at 11/01/21 0859   OLANZapine zydis (ZYPREXA) disintegrating tablet 5 mg  5 mg Oral Q8H PRN Viann Fish E, MD   5 mg at 10/14/21 1416   And   ziprasidone (GEODON) injection 20 mg  20 mg Intramuscular PRN Harlow Asa, MD       Lab Results:  No results found for this or any previous visit (from the past 92 hour(s)).    Blood Alcohol level:  Lab Results  Component Value Date   ETH <10 25/42/7062   Metabolic Disorder Labs: Lab Results  Component Value Date   HGBA1C 5.3 09/08/2021   MPG 105.41 09/08/2021   No results found for: "PROLACTIN" Lab Results  Component Value Date   CHOL 173 09/08/2021   TRIG 110 09/08/2021   HDL 50 09/08/2021   CHOLHDL 3.5 09/08/2021   VLDL 22 09/08/2021   LDLCALC 101 (H) 09/08/2021   LDLCALC 102 (H) 06/21/2020   Physical Findings:  AIMS: Facial and Oral Movements Muscles of Facial Expression: None, normal Lips and Perioral Area: None, normal Jaw: None, normal Tongue: None, normal,Extremity Movements Upper (arms, wrists, hands, fingers): None, normal Lower (legs, knees, ankles, toes): None,  normal, Trunk Movements Neck, shoulders, hips: None, normal, Overall Severity Severity of abnormal movements (highest score from questions above): None, normal Incapacitation due to abnormal movements: None, normal Patient's awareness of abnormal movements (rate only patient's report): No Awareness, Dental Status Current problems with teeth and/or dentures?: No Does patient usually wear dentures?: No  No Cogwheeling or Rigidity Present.  Musculoskeletal: Strength & Muscle Tone: within normal limits Gait & Station: normal Patient leans: N/A  Psychiatric Specialty Exam:  Presentation  General Appearance: unkempt appearing - wearing hair bonnet and multiple layers of clothing  Eye Contact:Fair  Speech:rambling but normal  fluency  Speech Volume:Normal  Mood and Affect  Mood: appears anxious  Affect:anxious  Thought Process  Thought Processes: perseverative and ruminative; concrete  Orientation:Partial  Thought Content: Continues to have baseline, fixed delusions but does not mention these unless prompted with questions; is not grossly responding to internal/external stimuli on exam; denies SI, HI, AVH or paranoia and denies ideas of reference or first rank symptoms but is guarded and perseverative about need for continued admission   Hallucinations:Denied   Ideas of Reference:Denied  Suicidal Thoughts:Denied   Homicidal Thoughts:Denied   Sensorium  Memory:Limited   Judgment:Poor  Insight:Poor  Executive Functions  Concentration:Poor  Attention Span:Poor  Recall:Poor  Fund of Knowledge:Limited  Language:Fair  Psychomotor Activity  Psychomotor Activity:No cogwheeling, no stiffness, no tremor, AIMS 0   Assets  Assets:Resilience  Sleep  7 hours  Physical Exam Constitutional:      Appearance: the patient is not toxic-appearing.  Pulmonary:     Effort: Pulmonary effort is normal.  Neurological:     General: No focal deficit present.   Review  of Systems  Respiratory:  Negative for shortness of breath.   Cardiovascular:  Negative for chest pain.  Gastrointestinal:  Negative for abdominal pain, constipation, diarrhea, nausea and vomiting.  Neurological:  Negative for headaches.    Blood pressure 115/86, pulse (!) 108, temperature 97.9 F (36.6 C), temperature source Oral, resp. rate 16, height 5' 6"  (1.676 m), weight 72.6 kg, SpO2 100 %. Body mass index is 25.83 kg/m.  Treatment Plan Summary: Daily contact with patient to assess and evaluate symptoms and progress in treatment and Medication management  Safety and Monitoring: Involuntary admission to inpatient psychiatric unit for safety, stabilization and treatment Daily contact with patient to assess and evaluate symptoms and progress in treatment Patient's case to be discussed in multi-disciplinary team meeting Observation Level : q15 minute checks Vital signs: q12 hours Precautions: safety   Bipolar I MRE manic with psychotic features (r/o schizoaffective d/o - bipolar type)  -Continue Haldol 5 mg AM and 10 mg QHS for psychosis and mood stabilization (plan to continue given upcoming discharge to sister's house with goal of tapering to monotherapy on Invega LAI as an outpatient) -Continue Melatonin 3 mg nightly for insomnia -Continue Cogentin 0.5 mg twice daily  -Invega Sustenna 234 mg IM given on 09-30-21 145m IM given 10/12/20 - Invega 78 mg on 7/21, reduced dose given due to CrCl of 54 after consultation with pharmacist regarding monthly maintenance dose and consent obtained from guardian for medication administration -- Zyprexa/Geodon/ativan agitation protocol PR  -Additional psychosis labs (HIV- , RPR-, ANA-, heavy metal WNL, ESR wnl, ceruloplasmin wnl and CT head shows Marked for age brain atrophy.  No acute or reversible finding.)   -Depakote, Ativan, and Zyprexa stopped    Hyperammonemia -resolved -Ammonia level 30 on 10/27/21 -Depakote was stopped    Mildly  elevated creatinine  -Creatinine of 1.22 on 7/18    Season allergies -Continue Claritin 164mdaily PRN    Urinary frequency- resolved -repeat UA with rare bacteria, but other wise, WNL  Discharge Planning: Social work and case management to assist with discharge planning and identification of hospital follow-up needs prior to discharge Estimated LOS: 5-7 days Discharge Concerns: Need to establish a safety plan; Medication compliance and effectiveness Discharge Goals: Return home with outpatient referrals for mental health follow-up including medication management/psychotherapy Labs On Admission- CMP: WNL except Cr: 1.1,  GFR: 60,  CBC: WNL,  UDS: Neg,  Troponin: 3,  EtOH: Neg,  beta hCG: 6.4,  Resp Panel: Neg 5/29- Urine Preg: Neg,  A1c: 5.3,  TSH: 1.719,  Lipid Panel: WNL except LDL: 101 5/24- BMP: WNL except Cr: 1.01,  Ca: 8.4,  hCG: 5,  Urine Preg: Neg 5/28- Ammonia: 32,  CBC: WNL,  CMP: WNL except  Cr: 1.08,  Ca: 8.6,  Depakote lvl: 20 6/3- Depakote lvl: 64, Hep Function: WNL,  CBC: WNL except Hem: 11.9 6/7- Depakote lvl: 75 6/18- UA: Neg,  CBC: WNL,  Depakote lvl: 136,  Ammonia: 99,  BMP: WNL except BUN: 22,  Cr: 1.19,  Ca: 8.6,  GFR: 54,  Hep Function: WNL except Albu: 3.3,  Indirect Bili: 0.2 6/20- ANA: Neg,  Ammonia: 48,  Ceruloplasmin: 24.2,  CMP: WNL except BUN: 23,  Cr: 1.35,  Ca: 8.3,  Albu: 3.4  HIV:Neg,  RPR: Neg,  Sed Rate: 6,  Depakote lvl: 25,  Heavy Metal: Neg 6/23- Ammonia: 46,  BMP: WNL except Cr: 1.2,  Ca: 8.3,  GFR: 54 6/24- UA: WNL except Bacteria: rare 6/30- CMP:  WNL except Cr: 1.04,  Ca: 8.6,  CBC: WNL,  Ammonia: 41, EKG: NSR with Qtc: 399 7/8 - CMP WNL other than CR 1.33 and GFR 48; Ammonia 30 7/18 - Cr of 1.22, eGFR of 53  Corky Sox, MD 11/08/2021, 4:04 PM

## 2021-11-09 DIAGNOSIS — F02818 Dementia in other diseases classified elsewhere, unspecified severity, with other behavioral disturbance: Secondary | ICD-10-CM | POA: Diagnosis present

## 2021-11-09 DIAGNOSIS — F312 Bipolar disorder, current episode manic severe with psychotic features: Secondary | ICD-10-CM | POA: Diagnosis not present

## 2021-11-09 MED ORDER — DOCUSATE SODIUM 100 MG PO CAPS
100.0000 mg | ORAL_CAPSULE | Freq: Every day | ORAL | Status: DC | PRN
  Administered 2021-11-11 – 2021-11-20 (×8): 100 mg via ORAL
  Filled 2021-11-09 (×10): qty 1

## 2021-11-09 NOTE — Progress Notes (Signed)
[facility] LCSW Progress Note   11/09/2021    3:51 PM  LEANDA PADMORE   [id number]   Type of Contact and Topic:  Care Coordination   CSW attempted to confirm discharge plan with sister Ceara Wrightson. Per prior note, patient sister has tentatively agreed to allow paitent to reside at her home until disability is approved. CSW attempted multiple attempts to reach sister to determine a date od discharge. Unable to reach sister, Left HIPAA compliant voicemail with contact information and callback request. Situation ongoing, CSW will continue to monitor and update note as more information becomes available.     CSW team will make additional attempts to reach sister in order to discharge patient.    Signed:  [name], MSW, LCSWA, LCAS 11/09/2021 3:51 PM

## 2021-11-09 NOTE — Progress Notes (Signed)
Pt was encouraged but didn't attend orientation/goals group. ?

## 2021-11-09 NOTE — [facility] Group Notes (Signed)
Spirituality group facilitated by Kathleen Argue, BCC.   Group Description: Group focused on topic of hope. Patients participated in facilitated discussion around topic, connecting with one another around experiences and definitions for hope. Group members engaged with visual explorer photos, reflecting on what hope looks like for them today. Group engaged in discussion around how their definitions of hope are present today in hospital.   Modalities: Psycho-social ed, Adlerian, Narrative, MI   Patient Progress: Ananiah came into group partway through, but did not really participate.    [address] B[facility] Pager, [phone]

## 2021-11-09 NOTE — Progress Notes (Signed)
   11/09/21 0515  Sleep  Number of Hours 7.25

## 2021-11-09 NOTE — Progress Notes (Signed)
Adult Psychoeducational Group Note  Date:  11/09/2021 Time:  8:47 PM  Group Topic/Focus:  Wrap-Up Group:   The focus of this group is to help patients review their daily goal of treatment and discuss progress on daily workbooks.  Participation Level:  Active  Participation Quality:  Appropriate  Affect:  Appropriate  Cognitive:  Appropriate  Insight: Appropriate  Engagement in Group:  Limited  Modes of Intervention:  Discussion  Additional Comments:   Pt stated her goal for today was to focus on her treatment plan. Pt stated she accomplished her goal today. Pt stated she  talked with her doctor and with her social worker about her care today. Pt rated her overall day a  5 out of 10. Pt stated she was able to contact her mother and her mother coming for visitation tonight  improved her overall day. Pt stated she felt better about herself tonight. Pt stated she was able to attend all meals today.  Pt stated she took all medications provided today. Pt stated her appetite was pretty good today. Pt rated her sleep last night was good. Pt stated the goal tonight was to get some rest. Pt stated she had no physical pain tonight. Pt deny visual hallucinations and auditory issues tonight. Pt denies thoughts of harming herself or others. Pt stated she would alert staff if anything changed  Natalie Brown 11/09/2021, 8:47 PM

## 2021-11-09 NOTE — Group Note (Signed)
Recreation Therapy Group Note   Group Topic:Self-Esteem  Group Date: 11/09/2021 Start Time: 1011 End Time: 1055 Facilitators: Caroll Rancher, LRT,CTRS Location: [address] Dayroom   Goal Area(s) Addresses:  Patient will appropriately identify what self esteem is.  Patient will create a shield of armor describing themselves.  Patient will successfully identify positive attributes about themselves.  Patient will acknowledge benefit of improved self-esteem.    Activity Description:  Self-Esteem Shield. Patient attended a recreation therapy group session focused on self esteem. Patient identified what self esteem is, and why it is important to have high self esteem during group discussion. LRT wrote on the white board, drawing the outline of the shield and labeling the quadrants.  Patient was asked to create their own shield to show off their unique attributes, four quadrants reflected the following:  The Upper Left quadrant- two things or persons they admire most The Upper Right quadrant- two lessons they've learned thus far in life The Lower Left quadrant- at least three qualities that make them unique The Lower Right quadrant- two goals they are working towards   Patients were provided sheets with the shield printed on them and colored pencils, markers and crayons to complete the activity.  Patients were debriefed on the importance of healthy self esteem.    Affect/Mood: Appropriate   Participation Level: Engaged   Participation Quality: Independent   Behavior: Appropriate   Speech/Thought Process: Relevant and Rational   Insight: Fair   Judgement: Fair    Modes of Intervention: Art   Patient Response to Interventions:  Engaged   Education Outcome:  Acknowledges education and In group clarification offered    Clinical Observations/Individualized Feedback: Pt was attentive and able to focus on activity.  Pt was able to work through distractions.  Pt identified her mother,  sister and nephews as persons she values most.  Pt also expressed wanting to discharge today and taking her medications as things she's working towards. Lastly, pt identified pictures as things important to her.      Plan: Continue to engage patient in RT group sessions 2-3x/week.   Caroll Rancher, Antonietta Jewel 11/09/2021 12:34 PM

## 2021-11-09 NOTE — Progress Notes (Addendum)
[facility] MD Progress Note  11/09/2021 5:44 PM Natalie Brown  MRN:  [id number]  Reason For Admission: Natalie Brown is a 56 yr old female who presented on 5/17 to [facility] with complaints of anxiety and feeling overwhelmed and SI without a plan, she was admitted to [facility] on 5/19.  PPHx is significant for possible Bipolar Disorder.  Yesterday, the psychiatry team made following recommendations: Given Invega Sustenna 78 mg x1  Interval History: PRN Medications administered within the last 24 hours: none Per nursing staff: patient has been pre-occupied and discheveled but cooperative  Per Patient:  On assessment today, the patient reports that she saw her sister yesterday.  The patient is asked multiple questions about this visit, and it is clear that the patient has difficulty following the questions.  She randomly mentions "I have talked to a couple judges".  Later in the conversation she says "what is medicaid?"  With no prompting.  The patient denies suicidal or homicidal thoughts.  She denies auditory or visual hallucinations, ideas of reference or paranoia. She continues to make delusional reference to having multiple degrees, speaking multiple languages, and planning to work when she is discharged.  Patient denies side effects to current scheduled psychiatric medications.   Patient reports other somatic complaints, such as headache and diarrhea.   Discussed patient's care with legal guardian, patient's mother.  She continues to have more questions about the medications.  These were all answered.  She requests that the patient be discontinued from her melatonin and Flonase.  [name] concerned that the patient is on 5 total medications.  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 [facility])   Past Psychiatric History: Probable bipolar disorder  Past Medical History:  Past Medical History:  Diagnosis Date   Bipolar 1 disorder with moderate mania ([place]) 12/30/2013   Hypertension    Family History: see H&P  Family Psychiatric  History: Patient reports mental illness runs in her father's side of the family  Social History:  Social History   Substance and Sexual Activity  Alcohol Use No     Social History   Substance and Sexual Activity  Drug Use No    Social History   Socioeconomic History   Marital status: Married    Spouse name: Not on file   Number of children: Not on file   Years of education: Not on file   Highest education level: Not on file  Occupational History   Not on file  Tobacco Use   Smoking status: Every Day   Smokeless tobacco: Current  Substance and Sexual Activity   Alcohol use: No   Drug use: No   Sexual activity: Not on file  Other Topics Concern   Not on file  Social History Narrative   Not on file   Social Determinants of Health   Financial Resource Strain: Not on file  Food Insecurity: Not on file  Transportation Needs: Not on file  Physical Activity: Not on file  Stress: Not on file  Social Connections: Not on file   Additional Social History:   Sleep: Good  Appetite:  Good  Current Medications: Current Facility-Administered Medications  Medication Dose Route Frequency Provider Last Rate Last Admin   acetaminophen (TYLENOL) tablet 650 mg  650  mg Oral Q6H PRN Briant Cedar, MD   650 mg at 10/28/21 0757   alum & mag hydroxide-simeth (MAALOX/MYLANTA) 200-200-20 MG/5ML suspension 30 mL  30 mL Oral Q4H PRN Massengill, [name], MD       benztropine (COGENTIN) tablet 0.5 mg  0.5 mg Oral BID Armando Reichert, MD   0.5 mg at 11/09/21 0820   docusate sodium (COLACE) capsule 100 mg  100 mg Oral Daily PRN Corky Sox, MD        feeding supplement (KATE FARMS STANDARD 1.4) liquid 325 mL  325 mL Oral BID BM Massengill, [name], MD   325 mL at 11/08/21 1400   haloperidol (HALDOL) tablet 10 mg  10 mg Oral QHS Nelda Marseille, Floye Fesler E, MD   10 mg at 11/08/21 2052   haloperidol (HALDOL) tablet 5 mg  5 mg Oral Daily Harlow Asa, MD   5 mg at 11/09/21 0820   hydrocerin (EUCERIN) cream   Topical BID Janine Limbo, MD   Given at 11/09/21 0272   hydrOXYzine (ATARAX) tablet 25 mg  25 mg Oral TID PRN Janine Limbo, MD   25 mg at 10/23/21 0802   LORazepam (ATIVAN) tablet 1 mg  1 mg Oral Q6H PRN Harlow Asa, MD   1 mg at 10/07/21 1546   magnesium hydroxide (MILK OF MAGNESIA) suspension 30 mL  30 mL Oral Daily PRN Massengill, [name], MD   30 mL at 11/07/21 1440   nicotine polacrilex (NICORETTE) gum 2 mg  2 mg Oral PRN Winfred Leeds, Nadir, MD   2 mg at 11/01/21 0859   OLANZapine zydis (ZYPREXA) disintegrating tablet 5 mg  5 mg Oral Q8H PRN Harlow Asa, MD   5 mg at 10/14/21 1416   And   ziprasidone (GEODON) injection 20 mg  20 mg Intramuscular PRN Harlow Asa, MD       Lab Results:  No results found for this or any previous visit (from the past 71 hour(s)).    Blood Alcohol level:  Lab Results  Component Value Date   ETH <10 53/66/4403   Metabolic Disorder Labs: Lab Results  Component Value Date   HGBA1C 5.3 09/08/2021   MPG 105.41 09/08/2021   No results found for: "PROLACTIN" Lab Results  Component Value Date   CHOL 173 09/08/2021   TRIG 110 09/08/2021   HDL 50 09/08/2021   CHOLHDL 3.5 09/08/2021   VLDL 22 09/08/2021   LDLCALC 101 (H) 09/08/2021   LDLCALC 102 (H) 06/21/2020   Physical Findings:  AIMS: Facial and Oral Movements Muscles of Facial Expression: None, normal Lips and Perioral Area: None, normal Jaw: None, normal Tongue: None, normal,Extremity Movements Upper (arms, wrists, hands, fingers): None, normal Lower (legs, knees, ankles, toes): None, normal, Trunk Movements Neck, shoulders,  hips: None, normal, Overall Severity Severity of abnormal movements (highest score from questions above): None, normal Incapacitation due to abnormal movements: None, normal Patient's awareness of abnormal movements (rate only patient's report): No Awareness, Dental Status Current problems with teeth and/or dentures?: No Does patient usually wear dentures?: No  No Cogwheeling or Rigidity Present.  Musculoskeletal: Strength & Muscle Tone: within normal limits Gait & Station: normal Patient leans: N/A  Psychiatric Specialty Exam:  Presentation  General Appearance: unkempt appearing - wearing hair bonnet and multiple layers of clothing  Eye Contact:Fair  Speech:rambling but normal fluency  Speech Volume:Normal  Mood and Affect  Mood: appears anxious  Affect:anxious  Thought Process  Thought Processes: perseverative and ruminative; concrete  Orientation:Partial  Thought Content: Continues to have baseline, fixed delusions but does not mention these unless prompted with questions; is not grossly responding to internal/external stimuli on exam; denies SI, HI, AVH or paranoia and denies ideas of reference or first rank symptoms but is guarded and perseverative about need for continued admission   Hallucinations:Denied   Ideas of Reference:Denied  Suicidal Thoughts:Denied   Homicidal Thoughts:Denied   Sensorium  Memory:Limited   Judgment:Poor  Insight:Poor  Executive Functions  Concentration:Poor  Attention Span:Poor  Recall:Poor  Fund of Knowledge:Limited  Language:Fair  Psychomotor Activity  Psychomotor Activity:No cogwheeling, no stiffness, no tremor, AIMS 0   Assets  Assets:Resilience  Sleep  Fair   Physical Exam Constitutional:      Appearance: the patient is not toxic-appearing.  Pulmonary:     Effort: Pulmonary effort is normal.  Neurological:     General: No focal deficit present.   Review of Systems  Respiratory:  Negative for  shortness of breath.   Cardiovascular:  Negative for chest pain.  Gastrointestinal:  Negative for abdominal pain, constipation, diarrhea, nausea and vomiting.  Neurological:  Negative for headaches.    Blood pressure 136/77, pulse 89, temperature 98.6 F (37 C), temperature source Oral, resp. rate 16, height 5' 6"  (1.676 m), weight 72.6 kg, SpO2 99 %. Body mass index is 25.83 kg/m.  Treatment Plan Summary: Daily contact with patient to assess and evaluate symptoms and progress in treatment and Medication management  Safety and Monitoring: Involuntary admission to inpatient psychiatric unit for safety, stabilization and treatment Daily contact with patient to assess and evaluate symptoms and progress in treatment Patient's case to be discussed in multi-disciplinary team meeting Observation Level : q15 minute checks Vital signs: q12 hours Precautions: safety   Bipolar I MRE manic with psychotic features (r/o schizoaffective d/o - bipolar type) Major neurocognitive disorder with behavioral disturbance (marked for age brain atrophy on CT head and MOCA 11/30) At this time, the patient's mania has resolved with medications and she appears to have residual, fixed delusions. It is unclear if she has had a bipolar or schizoaffective d/o presentation prior to this hospitalization in the past, but overall her mood stability and behavioral control are improved at this time on medications. Given her brain atrophy and cognitive testing scores, she appears to have a major neurocognitive d/o of unclear etiology also present.   -Continue Haldol 5 mg AM and 10 mg QHS for psychosis/mania and mood stabilization (goal of tapering to monotherapy on Invega LAI as an outpatient) -d/c Melatonin 3 mg nightly for insomnia at request of guardian -Continue Cogentin 0.5 mg twice daily  -Invega Sustenna 234 mg IM given on 09-30-21, 153m IM given 10/12/20 - Invega 78 mg given on 7/21, reduced dose given due to CrCl of 54  after consultation with pharmacist regarding monthly maintenance dose and consent obtained from guardian for medication administration - AIMS on 7/20 of 0  -- Zyprexa/Geodon/ativan agitation protocol PRN  -Additional psychosis labs (HIV- , RPR-, ANA-, heavy metal WNL, ESR wnl, ceruloplasmin wnl and CT head shows Marked for age brain atrophy.  No acute or reversible finding.)   -Depakote, Ativan, and Zyprexa stopped - ACTT referral pending - Recommend Neurology f/u after discharge - Interim guardianship in place with mother and waiting on SSDI and medicaid for group home referral    Hyperammonemia -resolved -Ammonia level 30 on 10/27/21 -Depakote was stopped    Mildly elevated creatinine  -Creatinine of 1.22 on 7/18    Season allergies -Continue Claritin  74m daily PRN and d/c flonase at request of guardian    Urinary frequency- resolved -repeat UA with rare bacteria, but other wise, WNL  Discharge Planning: Social work and case management to assist with discharge planning and identification of hospital follow-up needs prior to discharge Discharge Concerns: Need to establish a safety plan; Medication compliance and effectiveness Discharge Goals: Return home with outpatient referrals for mental health follow-up including medication management/psychotherapy  Labs On Admission- CMP: WNL except Cr: 1.1,  GFR: 60,  CBC: WNL,  UDS: Neg,  Troponin: 3,  EtOH: Neg,  beta hCG: 6.4,  Resp Panel: Neg 5/29- Urine Preg: Neg,  A1c: 5.3,  TSH: 1.719,  Lipid Panel: WNL except LDL: 101 5/24- BMP: WNL except Cr: 1.01,  Ca: 8.4,  hCG: 5,  Urine Preg: Neg 5/28- Ammonia: 32,  CBC: WNL,  CMP: WNL except  Cr: 1.08,  Ca: 8.6,  Depakote lvl: 20 6/3- Depakote lvl: 64, Hep Function: WNL,  CBC: WNL except Hem: 11.9 6/7- Depakote lvl: 75 6/18- UA: Neg,  CBC: WNL,  Depakote lvl: 136,  Ammonia: 99,  BMP: WNL except BUN: 22,  Cr: 1.19,  Ca: 8.6,  GFR: 54,  Hep Function: WNL except Albu: 3.3,  Indirect Bili: 0.2 6/20-  ANA: Neg,  Ammonia: 48,  Ceruloplasmin: 24.2,  CMP: WNL except BUN: 23,  Cr: 1.35,  Ca: 8.3,  Albu: 3.4  HIV:Neg,  RPR: Neg,  Sed Rate: 6,  Depakote lvl: 25,  Heavy Metal: Neg 6/23- Ammonia: 46,  BMP: WNL except Cr: 1.2,  Ca: 8.3,  GFR: 54 6/24- UA: WNL except Bacteria: rare 6/30- CMP:  WNL except Cr: 1.04,  Ca: 8.6,  CBC: WNL,  Ammonia: 41, EKG: NSR with Qtc: 399 7/8 - CMP WNL other than CR 1.33 and GFR 48; Ammonia 30 7/18 - Cr of 1.22, eGFR of 53  NCorky Sox MD PGY1 11/09/2021, 5:44 PM

## 2021-11-09 NOTE — Group Note (Signed)
Type of Therapy and Topic: Group Therapy: Control  Participation Level: Active  Description of Group: In this group patients will discuss what is out of their control, what is somewhat in their control, and what is within their control.  They will be encouraged to explore what issues they can control and what issues are out of their control within their daily lives. They will be guided to discuss their thoughts, feelings, and behaviors related to these issues. The group will process together ways to better control things that are well within our own control and how to notice and accept the things that are not within our control. This group will be process-oriented, with patients participating in exploration of their own experiences as well as giving and receiving support and challenge from other group members.  During this group 2 worksheets will be provided to each patient to follow along and fill out.   Therapeutic Goals: 1. Patient will identify what is within their control and what is not within their control. 2. Patient will identify their thoughts and feelings about having control over their own lives. 3. Patient will identify their thoughts and feelings about not having control over everything in their lives.. 4. Patient will identify ways that they can have more control over their own lives. 5. Patient will identify areas were they can allow others to help them or provide assistance.  Summary of Patient Progress: Patient participated in group.  At some times, she was tangential and off topic but was easily redirected.  Patient reported that she uses religion to cope with anxiety .  Patient provided and received feedback from group peers about group topic.    Brandey Vandalen, LCSW, LCAS Clincal Social Worker  [facility]

## 2021-11-09 NOTE — Progress Notes (Signed)
   11/09/21 1100  Psych Admission Type (Psych Patients Only)  Admission Status Involuntary  Psychosocial Assessment  Patient Complaints Confusion;Worrying  Eye Contact Fair  Facial Expression Flat  Affect Appropriate to circumstance  Speech Soft  Interaction Assertive  Motor Activity Slow  Appearance/Hygiene Disheveled  Behavior Characteristics Cooperative  Mood Preoccupied  Thought Process  Coherency Disorganized  Content Preoccupation  Delusions None reported or observed  Perception WDL  Hallucination None reported or observed  Judgment Poor  Confusion Mild  Danger to Self  Current suicidal ideation? Denies  Danger to Others  Danger to Others None reported or observed

## 2021-11-10 DIAGNOSIS — F312 Bipolar disorder, current episode manic severe with psychotic features: Secondary | ICD-10-CM | POA: Diagnosis not present

## 2021-11-10 NOTE — Progress Notes (Signed)
Adult Psychoeducational Group Note  Date:  11/10/2021 Time:  8:30 PM  Group Topic/Focus:  Wrap-Up Group:   The focus of this group is to help patients review their daily goal of treatment and discuss progress on daily workbooks.  Participation Level:  Did Not Attend  Participation Quality:  Did Not Attend  Affect:  Did Not Attend  Cognitive:  Did Not Attend  Insight: Did Not Attend  Engagement in Group:  Did Not Attend  Modes of Intervention:  Did Not Attend  Additional Comments:   Pt was encouraged to attend wrap up group but refused.  Vevelyn Pat 11/10/2021, 8:30 PM

## 2021-11-10 NOTE — Progress Notes (Signed)
   11/10/21 2035  Psych Admission Type (Psych Patients Only)  Admission Status Involuntary  Psychosocial Assessment  Patient Complaints Worrying  Eye Contact Poor  Facial Expression Flat  Affect Appropriate to circumstance  Speech Soft  Interaction Minimal  Motor Activity Slow  Appearance/Hygiene Disheveled  Behavior Characteristics Cooperative;Appropriate to situation  Mood Pleasant  Thought Process  Coherency Disorganized  Content Preoccupation  Delusions WDL  Perception WDL  Hallucination None reported or observed  Judgment Poor  Confusion Mild  Danger to Self  Current suicidal ideation? Denies

## 2021-11-10 NOTE — [facility] Group Notes (Signed)
[facility] Group Notes:  (Social Work Group)  Date:  11/10/2021   Time:  11:00am - 12:00pm  Type of Therapy:  Group Therapy  Participation Level:  Minimal  Participation Quality:  Intrusive and Inattentive  Affect:  Anxious  Cognitive:  Disorganized and Confused  Insight:  None  Engagement in Group:  Distracting and Off Topic  Modes of Intervention:   Mindfulness, Cognitive Behavior Therapy, Music Activity  Summary of Progress/Problems:  Pt was late to group.  She was not able to follow the group content. She was intrusive and distracting and needed considerable redirection.  Pt talked about that she was "annoyed with the hospital" but what she said after that did not make logical sense.  Pt sat and appeared to attentively listen to the song PMHNP student facilitator played at the conclusion of the session.  Garvin Fila 11/10/2021, 3:29 PM

## 2021-11-10 NOTE — Progress Notes (Signed)
Pt is A&OX3, worried, mildly confused, intrusive, denies suicidal ideations, denies homicidal ideations, denies auditory hallucinations and denies visual hallucinations. Pt verbally agrees to approach staff if these become apparent and before harming self or others. Pt denies experiencing nightmares. Mood and affect are congruent. Pt appetite is ok. No complaints of anxiety, distress, pain and/or discomfort at this time. Pt's memory appears to be grossly intact, and Pt hasn't displayed any injurious behaviors. Pt is medication compliant. There's no evidence of suicidal intent. Psychomotor activity was WNL. No s/s of Parkinson, Dystonia, Akathisia and/or Tardive Dyskinesia noted.

## 2021-11-10 NOTE — Progress Notes (Signed)
   11/09/21 2055  Psych Admission Type (Psych Patients Only)  Admission Status Involuntary  Psychosocial Assessment  Patient Complaints Worrying;Confusion  Eye Contact Poor  Facial Expression Flat  Affect Appropriate to circumstance  Speech Soft  Interaction Minimal  Motor Activity Slow  Appearance/Hygiene Disheveled  Behavior Characteristics Cooperative;Appropriate to situation  Mood Preoccupied;Pleasant  Thought Process  Coherency Disorganized  Content Preoccupation  Delusions WDL  Judgment Poor  Confusion Mild     11/09/21 2055  Psych Admission Type (Psych Patients Only)  Admission Status Involuntary  Psychosocial Assessment  Patient Complaints Worrying;Confusion  Eye Contact Poor  Facial Expression Flat  Affect Appropriate to circumstance  Speech Soft  Interaction Minimal  Motor Activity Slow  Appearance/Hygiene Disheveled  Behavior Characteristics Cooperative;Appropriate to situation  Mood Preoccupied;Pleasant  Thought Process  Coherency Disorganized  Content Preoccupation  Delusions WDL  Judgment Poor  Confusion Mild

## 2021-11-10 NOTE — Progress Notes (Signed)
[facility] MD Progress Note  11/10/2021 5:14 PM Natalie Brown  MRN:  [id number]  Reason For Admission: Natalie Brown is a 56 yr old female who presented on 5/17 to [facility] with complaints of anxiety and feeling overwhelmed and SI without a plan, she was admitted to [facility] on 5/19.  PPHx is significant for possible Bipolar Disorder.  24 hr chart review: V/S have been WNL except for HR being slightly elevated earlier this morning at 106, then 108. Pt slept for 6.75 hrs last night as per nursing documentation. As per nursing documentation, she has been intrusive, inattentive, preoccupied, disheveled and confused. She has been compliant with all of her scheduled medications for the past 24 hrs.   Today's patient assessment: Pt presents today with a euthymic mood & .  [name] oriented to person, place, and knows that it is July 2023, and knows that the current president is Biden. Her attention to personal hygiene and grooming is poor, eye contact is good, speech is clear, but pt presents with flight of ideas, and ruminations about wanting to be discharged so that she can go live with her sister. As per reports from the treatment team, sister is agreeable for pt to go live with her, but repeated attempts were made to contact the sister yesterday, and there was no answer. Mother has temporary legal guardianship of pt, but she is not able to live at her mother's home due to the restrictions where she lives. Thought contents are unorganized and illogical, and pt currently denies SI/HI/AVH or paranoia. She continues to reports that she is able to speak multiple languages.  Pt reports a good sleep quality last night, and reports a good appetite. She denies being in any current physical distress. We are continuing medications as listed below.  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 [facility])   Past Psychiatric History: Probable bipolar disorder  Past Medical History:  Past Medical History:  Diagnosis Date   Bipolar 1 disorder with moderate mania ([place]) 12/30/2013   Hypertension    Family History: see H&P  Family Psychiatric  History: Patient reports mental illness runs in her father's side of the family  Social History:  Social History   Substance and Sexual Activity  Alcohol Use No     Social History   Substance and Sexual Activity  Drug Use No    Social History   Socioeconomic History   Marital status: Married    Spouse name: Not on file   Number of children: Not on file   Years of education: Not on file   Highest education level: Not on file  Occupational History   Not on file  Tobacco Use   Smoking status: Every Day   Smokeless tobacco: Current  Substance and Sexual Activity   Alcohol use: No   Drug use: No   Sexual activity: Not on file  Other Topics Concern   Not on file  Social History Narrative   Not on file   Social Determinants of Health   Financial Resource Strain: Not on file  Food Insecurity: Not on file  Transportation Needs: Not on file  Physical Activity: Not on file  Stress: Not on file  Social Connections: Not on file   Additional Social  History:   Sleep: Good  Appetite:  Good  Current Medications: Current Facility-Administered Medications  Medication Dose Route Frequency Provider Last Rate Last Admin   acetaminophen (TYLENOL) tablet 650 mg  650 mg Oral Q6H PRN Briant Cedar, MD   650 mg at 10/28/21 0757   alum & mag hydroxide-simeth (MAALOX/MYLANTA) 200-200-20 MG/5ML suspension 30 mL  30 mL Oral Q4H PRN Massengill, [name], MD       benztropine (COGENTIN) tablet 0.5 mg  0.5 mg Oral BID Armando Reichert, MD   0.5 mg at 11/10/21 0750   docusate  sodium (COLACE) capsule 100 mg  100 mg Oral Daily PRN Corky Sox, MD       feeding supplement (KATE FARMS STANDARD 1.4) liquid 325 mL  325 mL Oral BID BM Massengill, [name], MD   325 mL at 11/10/21 1312   haloperidol (HALDOL) tablet 10 mg  10 mg Oral QHS Nelda Marseille, Amy E, MD   10 mg at 11/09/21 2048   haloperidol (HALDOL) tablet 5 mg  5 mg Oral Daily Harlow Asa, MD   5 mg at 11/10/21 0315   hydrocerin (EUCERIN) cream   Topical BID Janine Limbo, MD   1 Application at 94/58/59 1621   hydrOXYzine (ATARAX) tablet 25 mg  25 mg Oral TID PRN Janine Limbo, MD   25 mg at 10/23/21 0802   LORazepam (ATIVAN) tablet 1 mg  1 mg Oral Q6H PRN Harlow Asa, MD   1 mg at 10/07/21 1546   magnesium hydroxide (MILK OF MAGNESIA) suspension 30 mL  30 mL Oral Daily PRN Massengill, [name], MD   30 mL at 11/07/21 1440   nicotine polacrilex (NICORETTE) gum 2 mg  2 mg Oral PRN Winfred Leeds, Nadir, MD   2 mg at 11/10/21 1614   OLANZapine zydis (ZYPREXA) disintegrating tablet 5 mg  5 mg Oral Q8H PRN Viann Fish E, MD   5 mg at 10/14/21 1416   And   ziprasidone (GEODON) injection 20 mg  20 mg Intramuscular PRN Harlow Asa, MD       Lab Results:  No results found for this or any previous visit (from the past 12 hour(s)).    Blood Alcohol level:  Lab Results  Component Value Date   ETH <10 29/24/4628   Metabolic Disorder Labs: Lab Results  Component Value Date   HGBA1C 5.3 09/08/2021   MPG 105.41 09/08/2021   No results found for: "PROLACTIN" Lab Results  Component Value Date   CHOL 173 09/08/2021   TRIG 110 09/08/2021   HDL 50 09/08/2021   CHOLHDL 3.5 09/08/2021   VLDL 22 09/08/2021   LDLCALC 101 (H) 09/08/2021   LDLCALC 102 (H) 06/21/2020   Physical Findings:  AIMS: Facial and Oral Movements Muscles of Facial Expression: None, normal Lips and Perioral Area: None, normal Jaw: None, normal Tongue: None, normal,Extremity Movements Upper (arms, wrists, hands, fingers): None,  normal Lower (legs, knees, ankles, toes): None, normal, Trunk Movements Neck, shoulders, hips: None, normal, Overall Severity Severity of abnormal movements (highest score from questions above): None, normal Incapacitation due to abnormal movements: None, normal Patient's awareness of abnormal movements (rate only patient's report): No Awareness, Dental Status Current problems with teeth and/or dentures?: No Does patient usually wear dentures?: No  No Cogwheeling or Rigidity Present.  Musculoskeletal: Strength & Muscle Tone: within normal limits Gait & Station: normal Patient leans: N/A  Psychiatric Specialty Exam:  Presentation  General Appearance: unkempt appearing - wearing hair bonnet and multiple layers  of clothing  Eye Contact:Good  [place] but normal fluency  Speech Volume:Normal  Mood and Affect  Mood: appears anxious  Affect:anxious  Thought Process  Thought Processes: perseverative and ruminative; concrete  Orientation:Partial  Thought Content: Continues to have baseline, fixed delusions but does not mention these unless prompted with questions; is not grossly responding to internal/external stimuli on exam; denies SI, HI, AVH or paranoia and denies ideas of reference or first rank symptoms but is guarded and perseverative about need for continued admission   Hallucinations:Denied   Ideas of Reference:Denied  Suicidal Thoughts:Denied   Homicidal Thoughts:Denied   Sensorium  Memory:Limited   Judgment:Poor  Insight:Poor  Executive Functions  Concentration:Poor  Attention Span:Poor  [place]  Psychomotor Activity  Psychomotor Activity:No cogwheeling, no stiffness, no tremor, AIMS 0   Assets  Assets:Communication Skills  Sleep  Fair   Physical Exam Constitutional:      Appearance: the patient is not toxic-appearing.  Pulmonary:     Effort: Pulmonary effort is normal.   Neurological:     General: No focal deficit present.   Review of Systems  Respiratory:  Negative for shortness of breath.   Cardiovascular:  Negative for chest pain.  Gastrointestinal:  Negative for abdominal pain, constipation, diarrhea, nausea and vomiting.  Neurological:  Negative for headaches.    Blood pressure 130/78, pulse (!) 108, temperature 98 F (36.7 C), temperature source Oral, resp. rate 16, height 5' 6"  (1.676 m), weight 72.6 kg, SpO2 100 %. Body mass index is 25.83 kg/m.  Treatment Plan Summary: Daily contact with patient to assess and evaluate symptoms and progress in treatment and Medication management  Safety and Monitoring: Involuntary admission to inpatient psychiatric unit for safety, stabilization and treatment Daily contact with patient to assess and evaluate symptoms and progress in treatment Patient's case to be discussed in multi-disciplinary team meeting Observation Level : q15 minute checks Vital signs: q12 hours Precautions: safety   Bipolar I MRE manic with psychotic features (r/o schizoaffective d/o - bipolar type) Major neurocognitive disorder with behavioral disturbance (marked for age brain atrophy on CT head and MOCA 11/30) At this time, the patient's mania has resolved with medications and she appears to have residual, fixed delusions. It is unclear if she has had a bipolar or schizoaffective d/o presentation prior to this hospitalization in the past, but overall her mood stability and behavioral control are improved at this time on medications. Given her brain atrophy and cognitive testing scores, she appears to have a major neurocognitive d/o of unclear etiology also present.    -Continue Haldol 5 mg AM and 10 mg QHS for psychosis/mania and mood stabilization (goal of tapering to monotherapy on Invega LAI as an outpatient)  -d/c Melatonin 3 mg nightly for insomnia at request of guardian  -Continue Cogentin 0.5 mg twice daily   -Invega  Sustenna 234 mg IM given on 09-30-21, 1105m IM given 10/12/20 - Invega 78 mg given on 7/21, reduced dose given due to CrCl of 54 after consultation with pharmacist regarding monthly maintenance dose and consent obtained from guardian for medication administration  - AIMS on 7/20 of 0   -- Zyprexa/Geodon/ativan agitation protocol PRN   -Additional psychosis labs (HIV- , RPR-, ANA-, heavy metal WNL, ESR wnl, ceruloplasmin wnl and CT head shows Marked for age brain atrophy.  No acute or reversible finding.)    -Depakote, Ativan, and Zyprexa stopped  - ACTT referral pending - Recommend Neurology f/u after discharge - Interim  guardianship in place with mother and waiting on SSDI and medicaid for group home referral    Hyperammonemia -resolved -Ammonia level 30 on 10/27/21 -Depakote was stopped    Mildly elevated creatinine  -Creatinine of 1.22 on 7/18    Season allergies -Continue Claritin 42m daily PRN and d/c flonase at request of guardian    Urinary frequency- resolved -repeat UA with rare bacteria, but other wise, WNL  Discharge Planning: Social work and case management to assist with discharge planning and identification of hospital follow-up needs prior to discharge Discharge Concerns: Need to establish a safety plan; Medication compliance and effectiveness Discharge Goals: Return home with outpatient referrals for mental health follow-up including medication management/psychotherapy  Labs On Admission- CMP: WNL except Cr: 1.1,  GFR: 60,  CBC: WNL,  UDS: Neg,  Troponin: 3,  EtOH: Neg,  beta hCG: 6.4,  Resp Panel: Neg 5/29- Urine Preg: Neg,  A1c: 5.3,  TSH: 1.719,  Lipid Panel: WNL except LDL: 101 5/24- BMP: WNL except Cr: 1.01,  Ca: 8.4,  hCG: 5,  Urine Preg: Neg 5/28- Ammonia: 32,  CBC: WNL,  CMP: WNL except  Cr: 1.08,  Ca: 8.6,  Depakote lvl: 20 6/3- Depakote lvl: 64, Hep Function: WNL,  CBC: WNL except Hem: 11.9 6/7- Depakote lvl: 75 6/18- UA: Neg,  CBC: WNL,  Depakote lvl:  136,  Ammonia: 99,  BMP: WNL except BUN: 22,  Cr: 1.19,  Ca: 8.6,  GFR: 54,  Hep Function: WNL except Albu: 3.3,  Indirect Bili: 0.2 6/20- ANA: Neg,  Ammonia: 48,  Ceruloplasmin: 24.2,  CMP: WNL except BUN: 23,  Cr: 1.35,  Ca: 8.3,  Albu: 3.4  HIV:Neg,  RPR: Neg,  Sed Rate: 6,  Depakote lvl: 25,  Heavy Metal: Neg 6/23- Ammonia: 46,  BMP: WNL except Cr: 1.2,  Ca: 8.3,  GFR: 54 6/24- UA: WNL except Bacteria: rare 6/30- CMP:  WNL except Cr: 1.04,  Ca: 8.6,  CBC: WNL,  Ammonia: 41, EKG: NSR with Qtc: 399 7/8 - CMP WNL other than CR 1.33 and GFR 48; Ammonia 30 7/18 - Cr of 1.22, eGFR of 53

## 2021-11-11 DIAGNOSIS — F312 Bipolar disorder, current episode manic severe with psychotic features: Secondary | ICD-10-CM | POA: Diagnosis not present

## 2021-11-11 DIAGNOSIS — F0390 Unspecified dementia without behavioral disturbance: Secondary | ICD-10-CM | POA: Diagnosis present

## 2021-11-11 DIAGNOSIS — F03918 Unspecified dementia, unspecified severity, with other behavioral disturbance: Secondary | ICD-10-CM | POA: Diagnosis present

## 2021-11-11 NOTE — Progress Notes (Signed)
Adult Psychoeducational Group Note  Date:  11/11/2021 Time:  9:09 PM  Group Topic/Focus:  Wrap-Up Group:   The focus of this group is to help patients review their daily goal of treatment and discuss progress on daily workbooks.  Participation Level:  Active  Participation Quality:  Redirectable  Affect:  Not Congruent  Cognitive:  Alert and Disorganized  Insight: Limited  Engagement in Group:  Engaged, Off Topic, and Poor  Modes of Intervention:  Discussion  Additional Comments:   Writer couldn't retrieve much information out of pt, because pt was preoccupied with her mother and sister visiting over the last few days. Pt states she had a good day and wasn't sure what her goal was even after re-explaining what it meant to have a goal. Pt was confused but did her best to respond to questions.  Vevelyn Pat 11/11/2021, 9:09 PM

## 2021-11-11 NOTE — Progress Notes (Signed)
   11/11/21 1000  Psych Admission Type (Psych Patients Only)  Admission Status Involuntary  Psychosocial Assessment  Patient Complaints Confusion;Worrying  Eye Contact Poor  Facial Expression Anxious;Worried  Affect Anxious;Sad  Speech Soft  Interaction Minimal  Motor Activity Slow  Appearance/Hygiene Layered clothes  Behavior Characteristics Cooperative;Intrusive  Mood Anxious;Preoccupied  Thought Process  Coherency Blocking;Disorganized  Content Preoccupation  Delusions WDL  Perception WDL  Hallucination None reported or observed  Judgment Impaired  Confusion Mild  Danger to Self  Current suicidal ideation? Denies  Agreement Not to Harm Self Yes  Description of Agreement verbal  Danger to Others  Danger to Others None reported or observed

## 2021-11-11 NOTE — Progress Notes (Signed)
Patient attended group, had snack and briefly watched tv before taking her medications so she could lie down. She questioned her medications and writer informed her of her medications and stressed that she has been taking the same medications at night. She requested medication for constipation and received colace. Support given and safety maintained with 15 min checks.  11/11/21 2045  Psych Admission Type (Psych Patients Only)  Admission Status Involuntary  Psychosocial Assessment  Patient Complaints Suspiciousness  Eye Contact Poor  Facial Expression Flat  Affect Appropriate to circumstance  Speech Soft  Interaction Minimal  Motor Activity Slow  Appearance/Hygiene Disheveled  Behavior Characteristics Cooperative;Appropriate to situation  Mood Pleasant;Preoccupied  Thought Process  Coherency Disorganized  Content Preoccupation  Delusions WDL  Perception WDL  Hallucination None reported or observed  Judgment Poor  Confusion Mild  Danger to Self  Current suicidal ideation? Denies

## 2021-11-11 NOTE — Progress Notes (Addendum)
[facility] MD Progress Note  11/11/2021 2:29 PM Natalie Brown  MRN:  [id number]  Reason For Admission: Natalie Brown is a 56 yr old female who presented on 5/17 to [facility] with complaints of anxiety and feeling overwhelmed and SI without a plan, she was admitted to [facility] on 5/19.  PPHx is significant for possible Bipolar Disorder.  24 hr chart review: V/S have been WNL except for HR which was elevated earlier this morning at 122, then repeated was 112. Pt slept for 6.75 hrs last night as per nursing documentation. As per nursing documentation, she has been disorganized, anxious, preoccupied, disheveled and confused. She has been compliant with all of her scheduled medications for the past 24 hrs. She has not required any sleep aides, antianxiety meds or agitation protocol medications in the past 24 hrs.  Today's patient assessment: Pt with a euthymic mood, and affect is congruent. Attention to personal hygiene and grooming is fair today, eye contact is good, speech is clear & coherent. Thought contents remain unorganized and illogical, and pt is tangential in her response to questions. She also ruminates about wanting to be discharged, and does not comprehend that her sister who is supposed to house her, has not yet been reached.  She would not answer calls from [facility] within the past week, and attempts will me made tomorrow to reach her. Pt currently denies SI/HI/AVH or paranoia. She continues to present with delusions which seem to be fixed at this point, and states that she is able to speak several languages  Pt reports a good sleep quality last night, and reports a good appetite. She denies being in any current physical distress. We are continuing medications as listed below. No TD/EPS type symptoms found on assessment, and pt denies any feelings of stiffness. AIMS: 0. She denies any medication related side effects. We are continuing all of her medications as listed below.  Principal Problem: Bipolar affective  disorder, current episode manic with psychotic symptoms ([place]) Diagnosis: Principal Problem:   Bipolar affective disorder, current episode manic with psychotic symptoms ([place]) Active Problems:   Major neurocognitive disorder ([place])   Past Psychiatric History: Probable bipolar disorder  Past Medical History:  Past Medical History:  Diagnosis Date   Bipolar 1 disorder with moderate mania ([place]) 12/30/2013   Hypertension    Family History: see H&P  Family Psychiatric  History: Patient reports mental illness runs in her father's side of the family  Social History:  Social History   Substance and Sexual Activity  Alcohol Use No     Social History   Substance and Sexual Activity  Drug Use No    Social History   Socioeconomic History   Marital status: Married    Spouse name: Not on file   Number of children: Not on file   Years of education: Not on file   Highest education level: Not on file  Occupational History   Not on file  Tobacco Use   Smoking status: Every Day   Smokeless tobacco: Current  Substance and Sexual Activity   Alcohol use: No   Drug use: No   Sexual activity: Not on file  Other Topics Concern   Not on file  Social History Narrative   Not on file   Social Determinants of Health   Financial Resource Strain: Not on file  Food Insecurity: Not on file  Transportation Needs: Not on file  Physical Activity: Not on file  Stress: Not on file  Social Connections: Not  on file   Additional Social History:   Sleep: Good  Appetite:  Good  Current Medications: Current Facility-Administered Medications  Medication Dose Route Frequency Provider Last Rate Last Admin   acetaminophen (TYLENOL) tablet 650 mg  650 mg Oral Q6H PRN Briant Cedar, MD   650 mg at 10/28/21 0757   alum & mag hydroxide-simeth (MAALOX/MYLANTA) 200-200-20 MG/5ML suspension 30 mL  30 mL Oral Q4H PRN Massengill, [name], MD       benztropine (COGENTIN) tablet 0.5 mg  0.5 mg Oral  BID Armando Reichert, MD   0.5 mg at 11/11/21 3546   docusate sodium (COLACE) capsule 100 mg  100 mg Oral Daily PRN Corky Sox, MD       feeding supplement (KATE FARMS STANDARD 1.4) liquid 325 mL  325 mL Oral BID BM Massengill, [name], MD   325 mL at 11/11/21 0805   haloperidol (HALDOL) tablet 10 mg  10 mg Oral QHS Nelda Marseille, Isaiah Cianci E, MD   10 mg at 11/10/21 2050   haloperidol (HALDOL) tablet 5 mg  5 mg Oral Daily Harlow Asa, MD   5 mg at 11/11/21 5681   hydrocerin (EUCERIN) cream   Topical BID Janine Limbo, MD   1 Application at 27/51/70 1621   hydrOXYzine (ATARAX) tablet 25 mg  25 mg Oral TID PRN Janine Limbo, MD   25 mg at 10/23/21 0802   LORazepam (ATIVAN) tablet 1 mg  1 mg Oral Q6H PRN Harlow Asa, MD   1 mg at 10/07/21 1546   magnesium hydroxide (MILK OF MAGNESIA) suspension 30 mL  30 mL Oral Daily PRN Massengill, [name], MD   30 mL at 11/07/21 1440   nicotine polacrilex (NICORETTE) gum 2 mg  2 mg Oral PRN Winfred Leeds, Nadir, MD   2 mg at 11/11/21 0807   OLANZapine zydis (ZYPREXA) disintegrating tablet 5 mg  5 mg Oral Q8H PRN Harlow Asa, MD   5 mg at 10/14/21 1416   And   ziprasidone (GEODON) injection 20 mg  20 mg Intramuscular PRN Harlow Asa, MD       Lab Results:  No results found for this or any previous visit (from the past 71 hour(s)).    Blood Alcohol level:  Lab Results  Component Value Date   ETH <10 01/74/9449   Metabolic Disorder Labs: Lab Results  Component Value Date   HGBA1C 5.3 09/08/2021   MPG 105.41 09/08/2021   No results found for: "PROLACTIN" Lab Results  Component Value Date   CHOL 173 09/08/2021   TRIG 110 09/08/2021   HDL 50 09/08/2021   CHOLHDL 3.5 09/08/2021   VLDL 22 09/08/2021   LDLCALC 101 (H) 09/08/2021   LDLCALC 102 (H) 06/21/2020   Physical Findings:  AIMS: Facial and Oral Movements Muscles of Facial Expression: None, normal Lips and Perioral Area: None, normal Jaw: None, normal Tongue: None,  normal,Extremity Movements Upper (arms, wrists, hands, fingers): None, normal Lower (legs, knees, ankles, toes): None, normal, Trunk Movements Neck, shoulders, hips: None, normal, Overall Severity Severity of abnormal movements (highest score from questions above): None, normal Incapacitation due to abnormal movements: None, normal Patient's awareness of abnormal movements (rate only patient's report): No Awareness, Dental Status Current problems with teeth and/or dentures?: No Does patient usually wear dentures?: No  No Cogwheeling or Rigidity Present.  Musculoskeletal: Strength & Muscle Tone: within normal limits Gait & Station: normal Patient leans: N/A  Psychiatric Specialty Exam:  Presentation  General Appearance: unkempt appearing -  wearing hair bonnet and multiple layers of clothing  Eye Contact:Good  Speech:rambling but normal fluency  Speech Volume:Normal  Mood and Affect  Mood: appears anxious  Affect:anxious  Thought Process  Thought Processes: perseverative and ruminative; concrete  Orientation:Partial  Thought Content: Continues to have baseline, fixed delusions but does not mention these unless prompted with questions; is not grossly responding to internal/external stimuli on exam; denies SI, HI, AVH or paranoia and denies ideas of reference or first rank symptoms but is guarded and perseverative about need for continued admission   Hallucinations:Denied   Ideas of Reference:Denied  Suicidal Thoughts:Denied   Homicidal Thoughts:Denied   Sensorium  Memory:Limited   Judgment:Impaired  Insight:Poor  Executive Functions  Concentration:Poor  Attention Span:Poor  [place] of Knowledge:Limited  Language:Fair  Psychomotor Activity  Psychomotor Activity:No cogwheeling, no stiffness, no tremor, AIMS 0   Assets  Assets:Social Support  Sleep  Fair   Physical Exam Constitutional:      Appearance: the patient is not  toxic-appearing.  Pulmonary:     Effort: Pulmonary effort is normal.  Neurological:     General: No focal deficit present.   Review of Systems  Respiratory:  Negative for shortness of breath.   Cardiovascular:  Negative for chest pain.  Gastrointestinal:  Negative for abdominal pain, constipation, diarrhea, nausea and vomiting.  Neurological:  Negative for headaches.    Blood pressure 117/82, pulse (!) 112, temperature 98.3 F (36.8 C), temperature source Oral, resp. rate 16, height 5' 6" (1.676 m), weight 72.6 kg, SpO2 98 %. Body mass index is 25.83 kg/m.  Treatment Plan Summary: Daily contact with patient to assess and evaluate symptoms and progress in treatment and Medication management  Safety and Monitoring: Involuntary admission to inpatient psychiatric unit for safety, stabilization and treatment Daily contact with patient to assess and evaluate symptoms and progress in treatment Patient's case to be discussed in multi-disciplinary team meeting Observation Level : q15 minute checks Vital signs: q12 hours Precautions: safety   Bipolar I MRE manic with psychotic features (r/o schizoaffective d/o - bipolar type) Major neurocognitive disorder with behavioral disturbance (marked for age brain atrophy on CT head and MOCA 11/30) At this time, the patient's mania has resolved with medications and she appears to have residual, fixed delusions. It is unclear if she has had a bipolar or schizoaffective d/o presentation prior to this hospitalization in the past, but overall her mood stability and behavioral control are improved at this time on medications. Given her brain atrophy and cognitive testing scores, she appears to have a major neurocognitive d/o of unclear etiology also present.    -Continue Haldol 5 mg AM and 10 mg QHS for psychosis/mania and mood stabilization (goal of tapering to monotherapy on Invega LAI as an outpatient)  -d/c Melatonin 3 mg nightly for insomnia at request  of guardian  -Continue Cogentin 0.5 mg twice daily   -Invega Sustenna 234 mg IM given on 09-30-21, 16m IM given 10/12/20 - Invega 78 mg given on 7/21, reduced dose given due to CrCl of 54 after consultation with pharmacist regarding monthly maintenance dose and consent obtained from guardian for medication administration  - AIMS on 7/20 of 0   -- Zyprexa/Geodon/ativan agitation protocol PRN   -Additional psychosis labs (HIV- , RPR-, ANA-, heavy metal WNL, ESR wnl, ceruloplasmin wnl and CT head shows Marked for age brain atrophy.  No acute or reversible finding.)    -Depakote, Ativan, and Zyprexa stopped  - ACTT referral pending - Recommend  Neurology f/u after discharge for major neurocognitive d/o evaluation/treatment options - Interim guardianship in place with mother and waiting on SSDI and medicaid for group home referral    Hyperammonemia -resolved -Ammonia level 30 on 10/27/21 -Depakote was stopped    Mildly elevated creatinine  -Creatinine of 1.22 on 7/18    Season allergies -Continue Claritin 36m daily PRN and d/c flonase at request of guardian    Urinary frequency- resolved -repeat UA with rare bacteria, but other wise, WNL  Discharge Planning: Social work and case management to assist with discharge planning and identification of hospital follow-up needs prior to discharge Discharge Concerns: Need to establish a safety plan; Medication compliance and effectiveness Discharge Goals: Return home with outpatient referrals for mental health follow-up including medication management/psychotherapy  Labs On Admission- CMP: WNL except Cr: 1.1,  GFR: 60,  CBC: WNL,  UDS: Neg,  Troponin: 3,  EtOH: Neg,  beta hCG: 6.4,  Resp Panel: Neg 5/29- Urine Preg: Neg,  A1c: 5.3,  TSH: 1.719,  Lipid Panel: WNL except LDL: 101 5/24- BMP: WNL except Cr: 1.01,  Ca: 8.4,  hCG: 5,  Urine Preg: Neg 5/28- Ammonia: 32,  CBC: WNL,  CMP: WNL except  Cr: 1.08,  Ca: 8.6,  Depakote lvl: 20 6/3-  Depakote lvl: 64, Hep Function: WNL,  CBC: WNL except Hem: 11.9 6/7- Depakote lvl: 75 6/18- UA: Neg,  CBC: WNL,  Depakote lvl: 136,  Ammonia: 99,  BMP: WNL except BUN: 22,  Cr: 1.19,  Ca: 8.6,  GFR: 54,  Hep Function: WNL except Albu: 3.3,  Indirect Bili: 0.2 6/20- ANA: Neg,  Ammonia: 48,  Ceruloplasmin: 24.2,  CMP: WNL except BUN: 23,  Cr: 1.35,  Ca: 8.3,  Albu: 3.4  HIV:Neg,  RPR: Neg,  Sed Rate: 6,  Depakote lvl: 25,  Heavy Metal: Neg 6/23- Ammonia: 46,  BMP: WNL except Cr: 1.2,  Ca: 8.3,  GFR: 54 6/24- UA: WNL except Bacteria: rare 6/30- CMP:  WNL except Cr: 1.04,  Ca: 8.6,  CBC: WNL,  Ammonia: 41, EKG: NSR with Qtc: 399 7/8 - CMP WNL other than CR 1.33 and GFR 48; Ammonia 30 7/18 - Cr of 1.22, eGFR of 53 7/23 - EKG pending for QTC monitoring  DNicholes Rough NP Patient ID: Natalie Brown female   DOB: [birth year] 56y.o.   MRN: [id number]

## 2021-11-11 NOTE — Group Note (Signed)
[facility] LCSW Group Therapy Note  Date/Time:  11/11/2021    Type of Therapy and Topic:  Group Therapy: Music and Mood  Participation Level:  Minimal   Description of Group: In this process group, members listened to a variety of music through choosing from CSW's list #1 through #25.  Patients identified the messages received from those songs and how the music affected their emotions.  Patients were encouraged to use music as a coping skill at home, but to be mindful of the choices made.  Patients discussed how this knowledge can help with wellness and recovery in various ways including managing depression and anxiety as well as encouraging healthy sleep habits.    Therapeutic Goals: Patients will explore the impact of different songs on mood Patients will verbalize the thoughts they have when listening to different types of music Patients will identify music that is soothing to them as well as music that is energizing to them Patients will discuss how to use this knowledge to assist in maintaining wellness and recovery Patients will explore the use of music as a coping skill Patients will encourage one another  Summary of Patient Progress:  At the beginning of group, patient was not present but she did arrive for the last few songs.  Patient listened to those songs and at the end of group stated "I'm a Christian so I feel good."   Therapeutic Modalities: Solution Focused Brief Therapy Activity   Ambrose Mantle, LCSW

## 2021-11-12 ENCOUNTER — Encounter (HOSPITAL_COMMUNITY): Payer: Self-pay

## 2021-11-12 DIAGNOSIS — F312 Bipolar disorder, current episode manic severe with psychotic features: Secondary | ICD-10-CM | POA: Diagnosis not present

## 2021-11-12 MED ORDER — HALOPERIDOL 5 MG PO TABS
7.5000 mg | ORAL_TABLET | Freq: Every day | ORAL | Status: DC
  Administered 2021-11-12: 7.5 mg via ORAL
  Filled 2021-11-12 (×4): qty 1

## 2021-11-12 NOTE — Progress Notes (Signed)
   11/12/21 2200  Psych Admission Type (Psych Patients Only)  Admission Status Involuntary  Psychosocial Assessment  Patient Complaints Suspiciousness  Eye Contact Poor  Facial Expression Anxious  Affect Anxious  Speech Soft  Interaction Cautious  Motor Activity Slow  Appearance/Hygiene Disheveled  Behavior Characteristics Cooperative  Mood Preoccupied  Aggressive Behavior  Effect No apparent injury  Thought Process  Coherency Disorganized  Content Preoccupation  Delusions WDL  Perception WDL  Hallucination None reported or observed  Judgment Poor  Confusion Mild  Danger to Self  Current suicidal ideation? Denies  Danger to Others  Danger to Others None reported or observed

## 2021-11-12 NOTE — BH IP Treatment Plan (Signed)
Interdisciplinary Treatment and Diagnostic Plan Update  11/12/2021 Time of Session: update Natalie Brown MRN: [id number]  Principal Diagnosis: Bipolar affective disorder, current episode manic with psychotic symptoms (HCC)  Secondary Diagnoses: Principal Problem:   Bipolar affective disorder, current episode manic with psychotic symptoms (HCC) Active Problems:   Major neurocognitive disorder (HCC)   Current Medications:  Current Facility-Administered Medications  Medication Dose Route Frequency Provider Last Rate Last Admin   acetaminophen (TYLENOL) tablet 650 mg  650 mg Oral Q6H PRN Lauro Franklin, MD   650 mg at 10/28/21 0757   alum & mag hydroxide-simeth (MAALOX/MYLANTA) 200-200-20 MG/5ML suspension 30 mL  30 mL Oral Q4H PRN Massengill, Harrold Donath, MD       benztropine (COGENTIN) tablet 0.5 mg  0.5 mg Oral BID Karsten Ro, MD   0.5 mg at 11/11/21 2045   docusate sodium (COLACE) capsule 100 mg  100 mg Oral Daily PRN Carlyn Reichert, MD   100 mg at 11/11/21 2049   feeding supplement (KATE FARMS STANDARD 1.4) liquid 325 mL  325 mL Oral BID BM Massengill, Harrold Donath, MD   325 mL at 11/11/21 1559   haloperidol (HALDOL) tablet 10 mg  10 mg Oral QHS Bartholomew Crews E, MD   10 mg at 11/11/21 2045   haloperidol (HALDOL) tablet 5 mg  5 mg Oral Daily Comer Locket, MD   5 mg at 11/11/21 4098   hydrocerin (EUCERIN) cream   Topical BID Phineas Inches, MD   1 Application at 11/10/21 1621   hydrOXYzine (ATARAX) tablet 25 mg  25 mg Oral TID PRN Phineas Inches, MD   25 mg at 10/23/21 0802   LORazepam (ATIVAN) tablet 1 mg  1 mg Oral Q6H PRN Comer Locket, MD   1 mg at 10/07/21 1546   magnesium hydroxide (MILK OF MAGNESIA) suspension 30 mL  30 mL Oral Daily PRN Massengill, Harrold Donath, MD   30 mL at 11/07/21 1440   nicotine polacrilex (NICORETTE) gum 2 mg  2 mg Oral PRN Abbott Pao, Nadir, MD   2 mg at 11/11/21 0807   OLANZapine zydis (ZYPREXA) disintegrating tablet 5 mg  5 mg Oral Q8H PRN  Comer Locket, MD   5 mg at 10/14/21 1416   And   ziprasidone (GEODON) injection 20 mg  20 mg Intramuscular PRN Comer Locket, MD       PTA Medications: Medications Prior to Admission  Medication Sig Dispense Refill Last Dose   CLARITIN 10 MG tablet Take 10 mg by mouth daily.      fluticasone (FLONASE) 50 MCG/ACT nasal spray Place 1 spray into both nostrils daily.      pseudoephedrine-guaifenesin (MUCINEX D) 60-600 MG 12 hr tablet Take 1 tablet by mouth every 12 (twelve) hours. 30 tablet 0     Patient Stressors: Health problems   Medication change or noncompliance    Patient Strengths: Capable of independent living  Printmaker for treatment/growth  Supportive family/friends   Treatment Modalities: Medication Management, Group therapy, Case management,  1 to 1 session with clinician, Psychoeducation, Recreational therapy.   Physician Treatment Plan for Primary Diagnosis: Bipolar affective disorder, current episode manic with psychotic symptoms (HCC) Long Term Goal(s): Improvement in symptoms so as ready for discharge   Short Term Goals: Ability to identify and develop effective coping behaviors will improve Ability to maintain clinical measurements within normal limits will improve Compliance with prescribed medications will improve Ability to identify changes in lifestyle to reduce recurrence of condition  will improve Ability to verbalize feelings will improve Ability to disclose and discuss suicidal ideas Ability to demonstrate self-control will improve  Medication Management: Evaluate patient's response, side effects, and tolerance of medication regimen.  Therapeutic Interventions: 1 to 1 sessions, Unit Group sessions and Medication administration.  Evaluation of Outcomes: Progressing  Physician Treatment Plan for Secondary Diagnosis: Principal Problem:   Bipolar affective disorder, current episode manic with psychotic symptoms (HCC) Active  Problems:   Major neurocognitive disorder (HCC)  Long Term Goal(s): Improvement in symptoms so as ready for discharge   Short Term Goals: Ability to identify and develop effective coping behaviors will improve Ability to maintain clinical measurements within normal limits will improve Compliance with prescribed medications will improve Ability to identify changes in lifestyle to reduce recurrence of condition will improve Ability to verbalize feelings will improve Ability to disclose and discuss suicidal ideas Ability to demonstrate self-control will improve     Medication Management: Evaluate patient's response, side effects, and tolerance of medication regimen.  Therapeutic Interventions: 1 to 1 sessions, Unit Group sessions and Medication administration.  Evaluation of Outcomes: Progressing   RN Treatment Plan for Primary Diagnosis: Bipolar affective disorder, current episode manic with psychotic symptoms (HCC) Long Term Goal(s): Knowledge of disease and therapeutic regimen to maintain health will improve  Short Term Goals: Ability to remain free from injury will improve, Ability to verbalize frustration and anger appropriately will improve, Ability to demonstrate self-control, Ability to participate in decision making will improve, Ability to verbalize feelings will improve, Ability to disclose and discuss suicidal ideas, Ability to identify and develop effective coping behaviors will improve, and Compliance with prescribed medications will improve  Medication Management: RN will administer medications as ordered by provider, will assess and evaluate patient's response and provide education to patient for prescribed medication. RN will report any adverse and/or side effects to prescribing provider.  Therapeutic Interventions: 1 on 1 counseling sessions, Psychoeducation, Medication administration, Evaluate responses to treatment, Monitor vital signs and CBGs as ordered, Perform/monitor  CIWA, COWS, AIMS and Fall Risk screenings as ordered, Perform wound care treatments as ordered.  Evaluation of Outcomes: Progressing   LCSW Treatment Plan for Primary Diagnosis: Bipolar affective disorder, current episode manic with psychotic symptoms (HCC) Long Term Goal(s): Safe transition to appropriate next level of care at discharge, Engage patient in therapeutic group addressing interpersonal concerns.  Short Term Goals: Engage patient in aftercare planning with referrals and resources, Increase social support, Increase ability to appropriately verbalize feelings, Increase emotional regulation, Facilitate acceptance of mental health diagnosis and concerns, Facilitate patient progression through stages of change regarding substance use diagnoses and concerns, Identify triggers associated with mental health/substance abuse issues, and Increase skills for wellness and recovery  Therapeutic Interventions: Assess for all discharge needs, 1 to 1 time with Social worker, Explore available resources and support systems, Assess for adequacy in community support network, Educate family and significant other(s) on suicide prevention, Complete Psychosocial Assessment, Interpersonal group therapy.  Evaluation of Outcomes: Progressing   Progress in Treatment: Attending groups: Yes. Participating in groups: Yes. Taking medication as prescribed: Yes. Toleration medication: Yes. Family/Significant other contact made: Yes, individual(s) contacted:  mother, guardian and sister Patient understands diagnosis: No. Discussing patient identified problems/goals with staff: Yes. Medical problems stabilized or resolved: Yes. Denies suicidal/homicidal ideation: Yes. Issues/concerns per patient self-inventory: No.   New problem(s) identified: No, Describe:  none reported   New Short Term/Long Term Goal(s):  medication stabilization, elimination of SI thoughts, development of comprehensive mental wellness  plan.    Patient Goals: No additional goals identified at this time. Patient to continue to work towards original goals identified in initial treatment team meeting. CSW will remain available to patient should they voice additional treatment goals.   Discharge Plan or Barriers: Patient currently is not connected with benefits and is not able to access safe placement.  CSW working alongside community partners to assist patient with accessing benefits for safe placement.   Reason for Continuation of Hospitalization: Delusions  Mania Medication stabilization  Estimated Length of Stay: TBD  Last 3 [place] Suicide Severity Risk Score: Flowsheet Row [facility] (Current) [facility] from 09/05/2021 in [facility] EM[facility]PART[facility] ED from [facility]/26/2020 in [facility]  C-SSRS RISK CATEGORY High Risk High Risk No Risk       Last PHQ 2/9 Scores:    05/26/2020    2:14 PM 09/03/2017    4:34 PM 08/07/2016    2:12 PM  Depression screen PHQ 2/9  Decreased Interest 3 3 3   Down, Depressed, Hopeless 3 1 2   PHQ - 2 Score 6 4 5   Altered sleeping 0 3 3  Tired, decreased energy 0 3 0  Change in appetite 3 3 0  Feeling bad or failure about yourself  0 3 1  Trouble concentrating 3 0 0  Moving slowly or fidgety/restless 3 0 1  Suicidal thoughts 0 0 0  PHQ-9 Score 15 16 10     Scribe for Treatment Team: , LCSW 11/12/2021 8:26 AM

## 2021-11-12 NOTE — Progress Notes (Signed)
The focus of this group is to help patients review their daily goal of treatment and discuss progress on daily workbooks.  Pt attended the evening group and responded to all discussion prompts from the Writer. Pt shared that today was a good day on the unit, the highlight of which was having a nice talk with her Doctor. "My Mom also came to see me again. She takes good care of me."  Pt told that her goal this week was to get her medications straightened out so that she may eventually discharge. "I can't remember them right now, but I wrote them down."  Pt rated her day a 7 out of 10 and her affect was flat. She appeared confused throughout wrap-up and had to be re-oriented to the discussion several times.

## 2021-11-12 NOTE — Group Note (Signed)
[facility] LCSW Group Therapy Note    Group Date: 11/12/2021 Start Time: 1300 End Time: 1400  Type of Therapy and Topic:  Group Therapy:  Overcoming Obstacles  Participation Level:  [facility] PARTICIPATION LEVEL: Did Not Attend    Description of Group:   In this group patients will be encouraged to explore what they see as obstacles to their own wellness and recovery. They will be guided to discuss their thoughts, feelings, and behaviors related to these obstacles. The group will process together ways to cope with barriers, with attention given to specific choices patients can make. Each patient will be challenged to identify changes they are motivated to make in order to overcome their obstacles. This group will be process-oriented, with patients participating in exploration of their own experiences as well as giving and receiving support and challenge from other group members.  Therapeutic Goals: 1. Patient will identify personal and current obstacles as they relate to admission. 2. Patient will identify barriers that currently interfere with their wellness or overcoming obstacles.  3. Patient will identify feelings, thought process and behaviors related to these barriers. 4. Patient will identify two changes they are willing to make to overcome these obstacles:    Summary of Patient Progress   Did not attend   Therapeutic Modalities:   Cognitive Behavioral Therapy Solution Focused Therapy Motivational Interviewing Relapse Prevention Therapy   [name] E Chailyn Racette, LCSW 

## 2021-11-12 NOTE — [facility] Counselor (Signed)
CSW called patient sister, Lafonda Mosses, 2x regarding possible discharge.  Sister did not answer.  CSW will continue to call and assist with arranging safe discharge.  CSW provided contact information for a call back.

## 2021-11-12 NOTE — Plan of Care (Addendum)
Called patient's mother and spoke 20 minutes- The patient's sister is in governmental housing and cannot have visitors more than 3 days. Mother does not feel this is an appropriate discharge plan and does not understand why her sister offered this plan stating she does not want the patient's sister to jeopardize getting kicked out of her housing since sister has a disabled son she cares for. Mother also does not know why sister offered for patient to stay with her for a month knowing that family has no resources for housing patient thereafter. She requests that SW continue to work on group home/ALF placement.   I advised mother that patient likely has dementia as well as bipolar d/o. We discussed that cognitively she is not improving but that her mania has resolved with medications. I advised that it is hard to know if some of her residual disorganized behaviors are related to behavioral changes associated with a dementia process. Mother states that the patient's father has dementia. I discussed the black box warning for use of antipsychotics in patients with dementia including risk of sudden cardiac death and stroke. I discussed that there is a risk that she will decompensate in terms of her bipolar illness if we attempt to reduce her antipsychotics. I advised that given her h/o hyperammonemia and encephalopathic changes on VPA that restart of this mood stabilizer is not advised. I discussed that given her kidney function that Dierdre Searles is not an option for alternative bipolar management. After discussion of options, mother would like [place] to try to taper patient off Haldol onto [place] sustenna as a single agent if possible. She understands that if patient starts to decompensate with dose reduction in Haldol, we would have to contact her and discuss options at that time. To reduce potential risks associated with 2 antipsychotic use, she requests that we try to move to monotherapy while she is waiting on group home/ALF  placement. I advised that an outpatient neurology referral could be made for further w/u and management of potential dementia after discharge. Time was given for questions.   Bartholomew Crews, MD, Celene Skeen

## 2021-11-12 NOTE — Progress Notes (Signed)
DAR NOTE: Patient presents with flat affect and depressed mood.  Denies suicidal thoughts, pain, auditory and visual hallucinations.  Rates depression at 5, hopelessness at 5, and anxiety at 5.  Maintained on routine safety checks.  Medications given as prescribed.  Support and encouragement offered as needed.  States goal for today is "I love to leave or discharge."  Patient visible in milieu with minimal interaction.  Offered no complaint.

## 2021-11-12 NOTE — Progress Notes (Addendum)
[facility] MD Progress Note  11/12/2021 3:04 PM Natalie Brown  MRN:  [id number]  Reason For Admission: Natalie Brown is a 56 yr old female who presented on 5/17 to [facility] with complaints of anxiety and feeling overwhelmed and SI without a plan, she was admitted to [facility] on 5/19.  PPHx is significant for possible Bipolar Disorder.  Yesterday, the psychiatry team made following recommendations: No changes  Interval History: PRN Medications administered within the last 24 hours: nicorette x1, docusate Per nursing staff: patient has been pre-occupied and confused  Per Patient:  On assessment today, the patient shows the attending and resident physician her involuntary commitment form.  She has done this many previous times.  She states "I am supposed to be on the outside".  She makes odd statements about "he will go to a box online".  Patient appears easily confused throughout the conversation.  Points have to be reiterated multiple times with the patient to understand.  Specifically, she is told that she is not able to go home with her sister.  She denies suicidal and homicidal thoughts.  She denies experiencing auditory or visual hallucinations.  She reports appropriate sleep and appetite.  She reports having a bowel movement recently.  Patient denies side effects to current scheduled psychiatric medications.   Patient denies other somatic complaints.  Discussed patient's care with legal guardian, patient's mother.  She reports that the patient is unable to go to her sister's home, given that her sister lives in [place] funded housing with restrictions on visitors and has a child with cerebral palsy.  Also discussed the potential cardiovascular complications associated with being on an antipsychotic medication while having dementia.  The patient's mother is agreeable to a plan to decrease Haldol and see if the patient can maintain stability on Invega injections.  The mother is aware that this could  potentially result in reactivation of bipolar symptoms.  However, she agrees to this medication plan.   Principal Problem: Bipolar affective disorder, current episode manic with psychotic symptoms ([place]) Diagnosis: Principal Problem:   Bipolar affective disorder, current episode manic with psychotic symptoms ([place]) Active Problems:   Major neurocognitive disorder ([place])   Past Psychiatric History: Probable bipolar disorder  Past Medical History:  Past Medical History:  Diagnosis Date   Bipolar 1 disorder with moderate mania ([place]) 12/30/2013   Hypertension    Family History: see H&P  Family Psychiatric  History: Patient reports mental illness runs in her father's side of the family  Social History:  Social History   Substance and Sexual Activity  Alcohol Use No     Social History   Substance and Sexual Activity  Drug Use No    Social History   Socioeconomic History   Marital status: Married    Spouse name: Not on file   Number of children: Not on file   Years of education: Not on file   Highest education level: Not on file  Occupational History   Not on file  Tobacco Use   Smoking status: Every Day   Smokeless tobacco: Current  Substance and Sexual Activity   Alcohol use: No   Drug use: No   Sexual activity: Not on file  Other Topics Concern   Not on file  Social History Narrative   Not on file   Social Determinants of Health   Financial Resource Strain: Not on file  Food Insecurity: Not on file  Transportation Needs: Not on file  Physical Activity: Not on file  Stress: Not on file  Social Connections: Not on file   Additional Social History:   Sleep: Good  Appetite:  Good  Current Medications: Current Facility-Administered Medications  Medication Dose Route Frequency Provider Last Rate Last Admin   acetaminophen (TYLENOL) tablet 650 mg  650 mg Oral Q6H PRN Briant Cedar, MD   650 mg at 10/28/21 0757   alum & mag hydroxide-simeth  (MAALOX/MYLANTA) 200-200-20 MG/5ML suspension 30 mL  30 mL Oral Q4H PRN Massengill, [name], MD       benztropine (COGENTIN) tablet 0.5 mg  0.5 mg Oral BID Armando Reichert, MD   0.5 mg at 11/12/21 0929   docusate sodium (COLACE) capsule 100 mg  100 mg Oral Daily PRN Corky Sox, MD   100 mg at 11/11/21 2049   feeding supplement (KATE FARMS STANDARD 1.4) liquid 325 mL  325 mL Oral BID BM Massengill, [name], MD   325 mL at 11/11/21 1559   haloperidol (HALDOL) tablet 5 mg  5 mg Oral Daily Nelda Marseille, Rodgers Likes E, MD   5 mg at 11/12/21 3300   haloperidol (HALDOL) tablet 7.5 mg  7.5 mg Oral QHS Corky Sox, MD       hydrocerin (EUCERIN) cream   Topical BID Janine Limbo, MD   Given at 11/12/21 7622   hydrOXYzine (ATARAX) tablet 25 mg  25 mg Oral TID PRN Janine Limbo, MD   25 mg at 10/23/21 0802   LORazepam (ATIVAN) tablet 1 mg  1 mg Oral Q6H PRN Harlow Asa, MD   1 mg at 10/07/21 1546   magnesium hydroxide (MILK OF MAGNESIA) suspension 30 mL  30 mL Oral Daily PRN Massengill, [name], MD   30 mL at 11/07/21 1440   nicotine polacrilex (NICORETTE) gum 2 mg  2 mg Oral PRN Winfred Leeds, Nadir, MD   2 mg at 11/11/21 0807   OLANZapine zydis (ZYPREXA) disintegrating tablet 5 mg  5 mg Oral Q8H PRN Harlow Asa, MD   5 mg at 10/14/21 1416   And   ziprasidone (GEODON) injection 20 mg  20 mg Intramuscular PRN Harlow Asa, MD       Lab Results:  No results found for this or any previous visit (from the past 63 hour(s)).    Blood Alcohol level:  Lab Results  Component Value Date   ETH <10 63/33/5456   Metabolic Disorder Labs: Lab Results  Component Value Date   HGBA1C 5.3 09/08/2021   MPG 105.41 09/08/2021   No results found for: "PROLACTIN" Lab Results  Component Value Date   CHOL 173 09/08/2021   TRIG 110 09/08/2021   HDL 50 09/08/2021   CHOLHDL 3.5 09/08/2021   VLDL 22 09/08/2021   LDLCALC 101 (H) 09/08/2021   LDLCALC 102 (H) 06/21/2020   Physical Findings:  AIMS: Facial  and Oral Movements Muscles of Facial Expression: None, normal Lips and Perioral Area: None, normal Jaw: None, normal Tongue: None, normal,Extremity Movements Upper (arms, wrists, hands, fingers): None, normal Lower (legs, knees, ankles, toes): None, normal, Trunk Movements Neck, shoulders, hips: None, normal, Overall Severity Severity of abnormal movements (highest score from questions above): None, normal Incapacitation due to abnormal movements: None, normal Patient's awareness of abnormal movements (rate only patient's report): No Awareness, Dental Status Current problems with teeth and/or dentures?: No Does patient usually wear dentures?: No  No Cogwheeling or Rigidity Present.  Musculoskeletal: Strength & Muscle Tone: within normal limits Gait & Station: normal Patient leans: N/A  Psychiatric Specialty Exam:  Presentation  General Appearance: unkempt appearing - wearing hair bonnet and multiple layers of clothing  Eye Contact:Good  Speech:rambling but normal fluency  Speech Volume:Normal  Mood and Affect  Mood: appears anxious  Affect:anxious  Thought Process  Thought Processes: perseverative and ruminative; concrete  Orientation:Partial  Thought Content: Continues to have baseline, fixed delusions but does not mention these unless prompted with questions; is not grossly responding to internal/external stimuli on exam; denies SI, HI, AVH or paranoia and denies ideas of reference or first rank symptoms but is guarded and perseverative about need for continued admission   Hallucinations:Denied   Ideas of Reference:Denied  Suicidal Thoughts:Denied   Homicidal Thoughts:Denied   Sensorium  Memory:Limited   Judgment:Impaired  Insight:Poor  Executive Functions  Concentration:Poor  Attention Span:Poor  [place] of Knowledge:Limited  Language:Fair  Psychomotor Activity  Psychomotor Activity:No cogwheeling, no stiffness, no tremor, AIMS 0  (7/20)   Assets  Assets:Social Support  Sleep  Fair   Physical Exam Constitutional:      Appearance: the patient is not toxic-appearing.  Pulmonary:     Effort: Pulmonary effort is normal.  Neurological:     General: No focal deficit present.   Review of Systems  Respiratory:  Negative for shortness of breath.   Cardiovascular:  Negative for chest pain.  Gastrointestinal:  Negative for abdominal pain, constipation, diarrhea, nausea and vomiting.  Neurological:  Negative for headaches.    Blood pressure 127/84, pulse (!) 126, temperature 98.5 F (36.9 C), temperature source Oral, resp. rate 16, height 5' 6"  (1.676 m), weight 72.6 kg, SpO2 96 %. Body mass index is 25.83 kg/m.  Treatment Plan Summary: Daily contact with patient to assess and evaluate symptoms and progress in treatment and Medication management  Safety and Monitoring: Involuntary admission to inpatient psychiatric unit for safety, stabilization and treatment Daily contact with patient to assess and evaluate symptoms and progress in treatment Patient's case to be discussed in multi-disciplinary team meeting Observation Level : q15 minute checks Vital signs: q12 hours Precautions: safety   Bipolar I MRE manic with psychotic features (r/o schizoaffective d/o - bipolar type) Major neurocognitive disorder with behavioral disturbance (marked for age brain atrophy on CT head and MOCA 11/30) At this time, the patient's mania has resolved with medications and she appears to have residual, fixed delusions. It is unclear if she has had a bipolar or schizoaffective d/o presentation prior to this hospitalization in the past, but overall the mania she had on admission has resolved and her mood stability and behavioral control are improved at this time on medications. Given her brain atrophy and cognitive testing scores, she appears to have a major neurocognitive d/o of unclear etiology also present.   -Reduce Haldol to 5 mg  AM and 7.5 mg QHS given patient's prolonged stability and goal to reduce antipsychotic polypharmacy after discussion with guardian (Discussed with guardian the black box warning for use of antipsychotics in patient's with dementia and guardian ideally would like the team to transition to monotherapy if possible - advised if her mania returns or she has decompensation with dose reduction in Haldol that we would have to readdress medication regimen with guardian at that time) -Continue Cogentin 0.5 mg twice daily - may be able to reduce medication need as Haldol is discontinued  -Invega Sustenna 234 mg IM given on 09-30-21, 144m IM given 10/12/20 - Invega 78 mg given on 7/21, reduced dose given due to CrCl of 54 after consultation with pharmacist regarding monthly maintenance dose and  consent obtained from guardian for medication administration - AIMS on 7/20 of 0  -- Zyprexa/Geodon/ativan agitation protocol PRN  -Additional psychosis labs (HIV- , RPR-, ANA-, heavy metal WNL, ESR wnl, ceruloplasmin wnl and CT head shows Marked for age brain atrophy.  No acute or reversible finding.)   -Depakote, Ativan, and Zyprexa stopped - ACTT referral pending - Recommend Neurology f/u after discharge for dementia evaluation/management - Interim guardianship in place with mother and waiting on SSDI and medicaid for group home referral    Hyperammonemia -resolved -Ammonia level 30 on 10/27/21 -Depakote was stopped    Mildly elevated creatinine  -Creatinine of 1.22 on 7/18    Season allergies -Continue Claritin 58m daily PRN and d/c flonase at request of guardian    Urinary frequency- resolved -repeat UA with rare bacteria, but other wise, WNL  Discharge Planning: Social work and case management to assist with discharge planning and identification of hospital follow-up needs prior to discharge Discharge Concerns: Need to establish a safety plan; Medication compliance and effectiveness Discharge Goals: Return  home with outpatient referrals for mental health follow-up including medication management/psychotherapy  Labs On Admission- CMP: WNL except Cr: 1.1,  GFR: 60,  CBC: WNL,  UDS: Neg,  Troponin: 3,  EtOH: Neg,  beta hCG: 6.4,  Resp Panel: Neg 5/29- Urine Preg: Neg,  A1c: 5.3,  TSH: 1.719,  Lipid Panel: WNL except LDL: 101 5/24- BMP: WNL except Cr: 1.01,  Ca: 8.4,  hCG: 5,  Urine Preg: Neg 5/28- Ammonia: 32,  CBC: WNL,  CMP: WNL except  Cr: 1.08,  Ca: 8.6,  Depakote lvl: 20 6/3- Depakote lvl: 64, Hep Function: WNL,  CBC: WNL except Hem: 11.9 6/7- Depakote lvl: 75 6/18- UA: Neg,  CBC: WNL,  Depakote lvl: 136,  Ammonia: 99,  BMP: WNL except BUN: 22,  Cr: 1.19,  Ca: 8.6,  GFR: 54,  Hep Function: WNL except Albu: 3.3,  Indirect Bili: 0.2 6/20- ANA: Neg,  Ammonia: 48,  Ceruloplasmin: 24.2,  CMP: WNL except BUN: 23,  Cr: 1.35,  Ca: 8.3,  Albu: 3.4  HIV:Neg,  RPR: Neg,  Sed Rate: 6,  Depakote lvl: 25,  Heavy Metal: Neg 6/23- Ammonia: 46,  BMP: WNL except Cr: 1.2,  Ca: 8.3,  GFR: 54 6/24- UA: WNL except Bacteria: rare 6/30- CMP:  WNL except Cr: 1.04,  Ca: 8.6,  CBC: WNL,  Ammonia: 41, EKG: NSR with Qtc: 399 7/8 - CMP WNL other than CR 1.33 and GFR 48; Ammonia 30 7/18 - Cr of 1.22, eGFR of 53 7/23 - EKG with Qtc of 4[facility] MD PGY-2

## 2021-11-13 DIAGNOSIS — F312 Bipolar disorder, current episode manic severe with psychotic features: Secondary | ICD-10-CM | POA: Diagnosis not present

## 2021-11-13 NOTE — Progress Notes (Signed)
[facility] MD Progress Note  11/13/2021 3:03 PM Natalie Brown  MRN:  [id number]  Reason For Admission: Natalie Brown is a 56 yr old female who presented on 5/17 to [facility] with complaints of anxiety and feeling overwhelmed and SI without a plan, she was admitted to [facility] on 5/19.  PPHx is significant for possible Bipolar Disorder.  Yesterday, the psychiatry team made following recommendations: Decrease Haldol to 5 mg AM  and 7.5 mg QHS given need to reduce antipsychotic polypharmacy  Interval History: PRN Medications administered within the last 24 hours: nicorette x1 Per nursing staff: patient has been pre-occupied and confused  Per Patient:  On assessment today, the patient appears similar to previous days. She reports a mood that is "okay". She makes odd statements about "I'm always seeing screens" and appears confused throughout the interview. She insists that she can go to her sister's home despite her mother's reports to the contrary. The patient denies auditory/visual hallucinations and first rank symptoms.  She reports good appetite, and sleep. She denies suicidal and homicidal thoughts.    Patient denies side effects to current scheduled psychiatric medications.   Patient denies other somatic complaints.   Principal Problem: Bipolar affective disorder, current episode manic with psychotic symptoms ([place]) Diagnosis: Principal Problem:   Bipolar affective disorder, current episode manic with psychotic symptoms ([place]) Active Problems:   Major neurocognitive disorder ([place])   Past Psychiatric History: Probable bipolar disorder  Past Medical History:  Past Medical History:  Diagnosis Date   Bipolar 1 disorder with moderate mania ([place]) 12/30/2013   Hypertension    Family History: see H&P  Family Psychiatric  History: Patient reports mental illness runs in her father's side of the family  Social History:  Social History   Substance and Sexual Activity  Alcohol Use No     Social  History   Substance and Sexual Activity  Drug Use No    Social History   Socioeconomic History   Marital status: Married    Spouse name: Not on file   Number of children: Not on file   Years of education: Not on file   Highest education level: Not on file  Occupational History   Not on file  Tobacco Use   Smoking status: Every Day   Smokeless tobacco: Current  Substance and Sexual Activity   Alcohol use: No   Drug use: No   Sexual activity: Not on file  Other Topics Concern   Not on file  Social History Narrative   Not on file   Social Determinants of Health   Financial Resource Strain: Not on file  Food Insecurity: Not on file  Transportation Needs: Not on file  Physical Activity: Not on file  Stress: Not on file  Social Connections: Not on file   Additional Social History:   Sleep: Good  Appetite:  Good  Current Medications: Current Facility-Administered Medications  Medication Dose Route Frequency Provider Last Rate Last Admin   acetaminophen (TYLENOL) tablet 650 mg  650 mg Oral Q6H PRN Briant Cedar, MD   650 mg at 10/28/21 0757   alum & mag hydroxide-simeth (MAALOX/MYLANTA) 200-200-20 MG/5ML suspension 30 mL  30 mL Oral Q4H PRN Massengill, [name], MD       benztropine (COGENTIN) tablet 0.5 mg  0.5 mg Oral BID Armando Reichert, MD   0.5 mg at 11/13/21 0820   docusate sodium (COLACE) capsule 100 mg  100 mg Oral Daily PRN Corky Sox, MD   100 mg at  11/11/21 2049   feeding supplement (KATE FARMS STANDARD 1.4) liquid 325 mL  325 mL Oral BID BM Massengill, [name], MD   325 mL at 11/11/21 1559   haloperidol (HALDOL) tablet 5 mg  5 mg Oral Daily Nelda Marseille, Amy E, MD   5 mg at 11/13/21 0820   haloperidol (HALDOL) tablet 7.5 mg  7.5 mg Oral QHS Corky Sox, MD   7.5 mg at 11/12/21 2038   hydrocerin (EUCERIN) cream   Topical BID Janine Limbo, MD   Given at 11/12/21 6387   hydrOXYzine (ATARAX) tablet 25 mg  25 mg Oral TID PRN Janine Limbo, MD    25 mg at 10/23/21 0802   LORazepam (ATIVAN) tablet 1 mg  1 mg Oral Q6H PRN Harlow Asa, MD   1 mg at 10/07/21 1546   magnesium hydroxide (MILK OF MAGNESIA) suspension 30 mL  30 mL Oral Daily PRN Massengill, [name], MD   30 mL at 11/07/21 1440   nicotine polacrilex (NICORETTE) gum 2 mg  2 mg Oral PRN Winfred Leeds, Nadir, MD   2 mg at 11/12/21 2040   OLANZapine zydis (ZYPREXA) disintegrating tablet 5 mg  5 mg Oral Q8H PRN Harlow Asa, MD   5 mg at 10/14/21 1416   And   ziprasidone (GEODON) injection 20 mg  20 mg Intramuscular PRN Harlow Asa, MD       Lab Results:  No results found for this or any previous visit (from the past 62 hour(s)).    Blood Alcohol level:  Lab Results  Component Value Date   ETH <10 56/43/3295   Metabolic Disorder Labs: Lab Results  Component Value Date   HGBA1C 5.3 09/08/2021   MPG 105.41 09/08/2021   No results found for: "PROLACTIN" Lab Results  Component Value Date   CHOL 173 09/08/2021   TRIG 110 09/08/2021   HDL 50 09/08/2021   CHOLHDL 3.5 09/08/2021   VLDL 22 09/08/2021   LDLCALC 101 (H) 09/08/2021   LDLCALC 102 (H) 06/21/2020   Physical Findings:  AIMS: Facial and Oral Movements Muscles of Facial Expression: None, normal Lips and Perioral Area: None, normal Jaw: None, normal Tongue: None, normal,Extremity Movements Upper (arms, wrists, hands, fingers): None, normal Lower (legs, knees, ankles, toes): None, normal, Trunk Movements Neck, shoulders, hips: None, normal, Overall Severity Severity of abnormal movements (highest score from questions above): None, normal Incapacitation due to abnormal movements: None, normal Patient's awareness of abnormal movements (rate only patient's report): No Awareness, Dental Status Current problems with teeth and/or dentures?: No Does patient usually wear dentures?: No  No Cogwheeling or Rigidity Present.  Musculoskeletal: Strength & Muscle Tone: within normal limits Gait & Station:  normal Patient leans: N/A  Psychiatric Specialty Exam:  Presentation  General Appearance: unkempt appearing - wearing hair bonnet and multiple layers of clothing  Eye Contact:Good  Speech:rambling but normal fluency  Speech Volume:Normal  Mood and Affect  Mood: euthymic  Affect: constricted, odd  Thought Process  Thought Processes: perseverative and ruminative; concrete  Orientation:Partial  Thought Content: Continues to have baseline, fixed delusions but does not mention these unless prompted with questions; is not grossly responding to internal/external stimuli on exam; denies SI, HI, AVH or paranoia and denies ideas of reference or first rank symptoms but is guarded and perseverative about need for continued admission   Hallucinations:Denied   Ideas of Reference:Denied  Suicidal Thoughts:Denied   Homicidal Thoughts:Denied   Sensorium  Memory:Limited   Judgment:Impaired  Insight:Poor  Community education officer  Concentration:Poor  Attention Span:Poor  [place]  Psychomotor Activity  Psychomotor Activity:No cogwheeling, no stiffness, no tremor, AIMS 0 (7/20)   Assets  Assets:Social Support  Sleep  Fair   Physical Exam Constitutional:      Appearance: the patient is not toxic-appearing.  Pulmonary:     Effort: Pulmonary effort is normal.  Neurological:     General: No focal deficit present.   Review of Systems  Respiratory:  Negative for shortness of breath.   Cardiovascular:  Negative for chest pain.  Gastrointestinal:  Negative for abdominal pain, constipation, diarrhea, nausea and vomiting.  Neurological:  Negative for headaches.    Blood pressure 118/87, pulse (!) 129, temperature 98.4 F (36.9 C), temperature source Oral, resp. rate 16, height 5' 6"  (1.676 m), weight 72.6 kg, SpO2 100 %. Body mass index is 25.83 kg/m.  Treatment Plan Summary: Daily contact with patient to assess and  evaluate symptoms and progress in treatment and Medication management  Safety and Monitoring: Involuntary admission to inpatient psychiatric unit for safety, stabilization and treatment Daily contact with patient to assess and evaluate symptoms and progress in treatment Patient's case to be discussed in multi-disciplinary team meeting Observation Level : q15 minute checks Vital signs: q12 hours Precautions: safety   Bipolar I MRE manic with psychotic features (r/o schizoaffective d/o - bipolar type) Major neurocognitive disorder with behavioral disturbance (marked for age brain atrophy on CT head and MOCA 11/30) At this time, the patient's mania has resolved with medications and she appears to have residual, fixed delusions. It is unclear if she has had a bipolar or schizoaffective d/o presentation prior to this hospitalization in the past, but overall the mania she had on admission has resolved and her mood stability and behavioral control are improved at this time on medications. Given her brain atrophy and cognitive testing scores, she appears to have a major neurocognitive d/o of unclear etiology also present.   -Reduce Haldol to 5 mg AM and 7.5 mg QHS (decrease gradually) given patient's prolonged stability and goal to reduce antipsychotic polypharmacy after discussion with guardian (Discussed with guardian the black box warning for use of antipsychotics in patient's with dementia and guardian ideally would like the team to transition to monotherapy if possible - advised if her mania returns or she has decompensation with dose reduction in Haldol that we would have to readdress medication regimen with guardian at that time) -Continue Cogentin 0.5 mg twice daily - may be able to reduce medication need as Haldol is discontinued  -Invega Sustenna 234 mg IM given on 09-30-21, 179m IM given 10/12/20 - Invega 78 mg given on 7/21, reduced dose given due to CrCl of 54 after consultation with pharmacist  regarding monthly maintenance dose and consent obtained from guardian for medication administration - AIMS on 7/20 of 0  -- Zyprexa/Geodon/ativan agitation protocol PRN  -Additional psychosis labs (HIV- , RPR-, ANA-, heavy metal WNL, ESR wnl, ceruloplasmin wnl and CT head shows Marked for age brain atrophy.  No acute or reversible finding.)   -Depakote, Ativan, and Zyprexa stopped - ACTT referral pending - Recommend Neurology f/u after discharge for dementia evaluation/management - Interim guardianship in place with mother and waiting on SSDI and medicaid for group home referral    Hyperammonemia -resolved -Ammonia level 30 on 10/27/21 -Depakote was stopped    Mildly elevated creatinine  -Creatinine of 1.22 on 7/18    Season allergies -Continue Claritin 142mdaily PRN and d/c flonase at  request of guardian    Urinary frequency- resolved -repeat UA with rare bacteria, but other wise, WNL  Discharge Planning: Social work and case management to assist with discharge planning and identification of hospital follow-up needs prior to discharge Discharge Concerns: Need to establish a safety plan; Medication compliance and effectiveness Discharge Goals: Return home with outpatient referrals for mental health follow-up including medication management/psychotherapy  Labs On Admission- CMP: WNL except Cr: 1.1,  GFR: 60,  CBC: WNL,  UDS: Neg,  Troponin: 3,  EtOH: Neg,  beta hCG: 6.4,  Resp Panel: Neg 5/29- Urine Preg: Neg,  A1c: 5.3,  TSH: 1.719,  Lipid Panel: WNL except LDL: 101 5/24- BMP: WNL except Cr: 1.01,  Ca: 8.4,  hCG: 5,  Urine Preg: Neg 5/28- Ammonia: 32,  CBC: WNL,  CMP: WNL except  Cr: 1.08,  Ca: 8.6,  Depakote lvl: 20 6/3- Depakote lvl: 64, Hep Function: WNL,  CBC: WNL except Hem: 11.9 6/7- Depakote lvl: 75 6/18- UA: Neg,  CBC: WNL,  Depakote lvl: 136,  Ammonia: 99,  BMP: WNL except BUN: 22,  Cr: 1.19,  Ca: 8.6,  GFR: 54,  Hep Function: WNL except Albu: 3.3,  Indirect Bili: 0.2 6/20-  ANA: Neg,  Ammonia: 48,  Ceruloplasmin: 24.2,  CMP: WNL except BUN: 23,  Cr: 1.35,  Ca: 8.3,  Albu: 3.4  HIV:Neg,  RPR: Neg,  Sed Rate: 6,  Depakote lvl: 25,  Heavy Metal: Neg 6/23- Ammonia: 46,  BMP: WNL except Cr: 1.2,  Ca: 8.3,  GFR: 54 6/24- UA: WNL except Bacteria: rare 6/30- CMP:  WNL except Cr: 1.04,  Ca: 8.6,  CBC: WNL,  Ammonia: 41, EKG: NSR with Qtc: 399 7/8 - CMP WNL other than CR 1.33 and GFR 48; Ammonia 30 7/18 - Cr of 1.22, eGFR of 53 7/23 - EKG with Qtc of [place], MD PGY-2

## 2021-11-13 NOTE — Group Note (Signed)
Recreation Therapy Group Note   Group Topic:Stress Management  Group Date: 11/13/2021 Start Time: 1000 End Time: 1055 Facilitators: Caroll Rancher, LRT,CTRS Location: [address] Dayroom    Goal Area(s) Addresses:  Patient will identify positive stress management techniques. Patient will identify benefits of using stress management post d/c.  Group Description:  Stress Exploration.  LRT and patients discussed the causes of stress and how to deal with them.  Patients gave examples of things that can cause a person to stress out such as work, bills, family, etc.  Patients were then given a worksheet that was divided into three areas daily hassles, major life changes and life circumstances.  Patients were to identify four stresses for each section.  On the back of the sheet, patients were to identify things that can help them deal with the stressor in each section listed above  Affect/Mood: Appropriate   Participation Level: Active   Participation Quality: Moderate Cues   Behavior: Appropriate   Speech/Thought Process: Confused   Insight: Impaired   Judgement: Impaired   Modes of Intervention: Worksheet   Patient Response to Interventions:  Receptive   Education Outcome:  Acknowledges education and In group clarification offered    Clinical Observations/Individualized Feedback: Pt was confused and needed assistance in completing activity.  Pt was able to express her main daily hassle was court, not sleeping and her mom fussing at her.  Pt identified major life changes as losing a job, here at the hospital, taking medications from staff and doctors.  Pt expressed her life circumstances were financial problems and having 2 jobs before coming here.  Pt kept repeating the same things over and had a hard time following along with the group.     Plan: Continue to engage patient in RT group sessions 2-3x/week.   Caroll Rancher, Antonietta Jewel 11/13/2021 12:42 PM

## 2021-11-13 NOTE — Progress Notes (Signed)
Adult Psychoeducational Group Note  Date:  11/13/2021 Time:  8:57 PM  Group Topic/Focus:  Wrap-Up Group:   The focus of this group is to help patients review their daily goal of treatment and discuss progress on daily workbooks.  Participation Level:  Did Not Attend  Participation Quality:   Did Not Attend  Affect:   Did Not Attend  Cognitive:   Did Not Attend  Insight: None  Engagement in Group:   Did Not Attend  Modes of Intervention:   Did Not Attend  Additional Comments:  Pt was encouraged to attend wrap up group but did not attend  Felipa Furnace 11/13/2021, 8:57 PM

## 2021-11-13 NOTE — Progress Notes (Signed)
   11/13/21 2145  Psych Admission Type (Psych Patients Only)  Admission Status Involuntary  Psychosocial Assessment  Patient Complaints Suspiciousness  Eye Contact Poor  Facial Expression Anxious  Affect Anxious  Speech Soft  Interaction Cautious  Motor Activity Slow  Appearance/Hygiene Disheveled  Behavior Characteristics Cooperative  Mood Suspicious  Aggressive Behavior  Effect No apparent injury  Thought Process  Coherency Disorganized  Content Preoccupation  Delusions WDL  Perception WDL  Hallucination None reported or observed  Judgment Poor  Confusion Mild  Danger to Self  Current suicidal ideation? Denies  Danger to Others  Danger to Others None reported or observed

## 2021-11-13 NOTE — Progress Notes (Signed)
   11/12/21 2200  Psych Admission Type (Psych Patients Only)  Admission Status Involuntary  Psychosocial Assessment  Patient Complaints Suspiciousness  Eye Contact Poor  Facial Expression Anxious  Affect Anxious  Speech Soft  Interaction Cautious  Motor Activity Slow  Appearance/Hygiene Disheveled  Behavior Characteristics Cooperative  Mood Preoccupied  Aggressive Behavior  Effect No apparent injury  Thought Process  Coherency Disorganized  Content Preoccupation  Delusions WDL  Perception WDL  Hallucination None reported or observed  Judgment Poor  Confusion Mild  Danger to Self  Current suicidal ideation? Denies  Danger to Others  Danger to Others None reported or observed    

## 2021-11-13 NOTE — Progress Notes (Signed)
   11/13/21 0530  Sleep  Number of Hours 5.25

## 2021-11-14 ENCOUNTER — Inpatient Hospital Stay: Payer: Self-pay | Admitting: Physician Assistant

## 2021-11-14 DIAGNOSIS — F312 Bipolar disorder, current episode manic severe with psychotic features: Secondary | ICD-10-CM | POA: Diagnosis not present

## 2021-11-14 MED ORDER — HALOPERIDOL 5 MG PO TABS
5.0000 mg | ORAL_TABLET | Freq: Every day | ORAL | Status: DC
  Administered 2021-11-14 – 2021-11-15 (×2): 5 mg via ORAL
  Filled 2021-11-14 (×4): qty 1

## 2021-11-14 NOTE — Progress Notes (Signed)
[facility] MD Progress Note  11/14/2021 4:39 PM Natalie Brown  MRN:  [id number]  Reason For Admission: Natalie Brown is a 56 yr old female who presented on 5/17 to [facility] with complaints of anxiety and feeling overwhelmed and SI without a plan, she was admitted to [facility] on 5/19.  PPHx is significant for possible Bipolar Disorder.  Yesterday, the psychiatry team made following recommendations: Continue Haldol 5 mg AM and 7.5 mg QHS  Interval History: PRN Medications administered within the last 24 hours: milk of mag x1 and Tylenol x1 Per nursing staff: patient has been pre-occupied and confused Of note, the patient missed administration of her nightly Haldol last night.  Per Patient:  On assessment today, the patient appears similar to previous days. She reports a mood that is "I am okay, God is with me".  When asked to elaborate, the patient reports "I should not be here at all, I could have just gone to [place] and gotten Thera works".  When asked about her missing the dose of Haldol she states that she was asleep and did not want to take it.  She denies suicidal or homicidal thoughts.  She denies auditory or visual hallucinations.  She reports a normal mood and reports good sleep and appetite.   Patient denies side effects to current scheduled psychiatric medications.   Patient denies other somatic complaints.   Principal Problem: Bipolar affective disorder, current episode manic with psychotic symptoms ([place]) Diagnosis: Principal Problem:   Bipolar affective disorder, current episode manic with psychotic symptoms ([place]) Active Problems:   Major neurocognitive disorder ([place])   Past Psychiatric History: Probable bipolar disorder  Past Medical History:  Past Medical History:  Diagnosis Date   Bipolar 1 disorder with moderate mania ([place]) 12/30/2013   Hypertension    Family History: see H&P  Family Psychiatric  History: Patient reports mental illness runs in her father's side of the  family  Social History:  Social History   Substance and Sexual Activity  Alcohol Use No     Social History   Substance and Sexual Activity  Drug Use No    Social History   Socioeconomic History   Marital status: Married    Spouse name: Not on file   Number of children: Not on file   Years of education: Not on file   Highest education level: Not on file  Occupational History   Not on file  Tobacco Use   Smoking status: Every Day   Smokeless tobacco: Current  Substance and Sexual Activity   Alcohol use: No   Drug use: No   Sexual activity: Not on file  Other Topics Concern   Not on file  Social History Narrative   Not on file   Social Determinants of Health   Financial Resource Strain: Not on file  Food Insecurity: Not on file  Transportation Needs: Not on file  Physical Activity: Not on file  Stress: Not on file  Social Connections: Not on file   Additional Social History:   Sleep: Good  Appetite:  Good  Current Medications: Current Facility-Administered Medications  Medication Dose Route Frequency Provider Last Rate Last Admin   acetaminophen (TYLENOL) tablet 650 mg  650 mg Oral Q6H PRN Briant Cedar, MD   650 mg at 11/14/21 0759   alum & mag hydroxide-simeth (MAALOX/MYLANTA) 200-200-20 MG/5ML suspension 30 mL  30 mL Oral Q4H PRN Massengill, [name], MD       benztropine (COGENTIN) tablet 0.5 mg  0.5  mg Oral BID Armando Reichert, MD   0.5 mg at 11/14/21 0759   docusate sodium (COLACE) capsule 100 mg  100 mg Oral Daily PRN Corky Sox, MD   100 mg at 11/11/21 2049   feeding supplement (KATE FARMS STANDARD 1.4) liquid 325 mL  325 mL Oral BID BM Massengill, [name], MD   325 mL at 11/14/21 1400   haloperidol (HALDOL) tablet 5 mg  5 mg Oral Daily Nelda Marseille, Amy E, MD   5 mg at 11/14/21 0758   haloperidol (HALDOL) tablet 7.5 mg  7.5 mg Oral QHS Corky Sox, MD   7.5 mg at 11/12/21 2038   hydrocerin (EUCERIN) cream   Topical BID Janine Limbo, MD    Given at 11/14/21 0800   hydrOXYzine (ATARAX) tablet 25 mg  25 mg Oral TID PRN Janine Limbo, MD   25 mg at 10/23/21 0802   LORazepam (ATIVAN) tablet 1 mg  1 mg Oral Q6H PRN Harlow Asa, MD   1 mg at 10/07/21 1546   magnesium hydroxide (MILK OF MAGNESIA) suspension 30 mL  30 mL Oral Daily PRN Janine Limbo, MD   30 mL at 11/14/21 0802   nicotine polacrilex (NICORETTE) gum 2 mg  2 mg Oral PRN Winfred Leeds, Nadir, MD   2 mg at 11/12/21 2040   OLANZapine zydis (ZYPREXA) disintegrating tablet 5 mg  5 mg Oral Q8H PRN Harlow Asa, MD   5 mg at 10/14/21 1416   And   ziprasidone (GEODON) injection 20 mg  20 mg Intramuscular PRN Harlow Asa, MD       Lab Results:  No results found for this or any previous visit (from the past 37 hour(s)).    Blood Alcohol level:  Lab Results  Component Value Date   ETH <10 64/33/2951   Metabolic Disorder Labs: Lab Results  Component Value Date   HGBA1C 5.3 09/08/2021   MPG 105.41 09/08/2021   No results found for: "PROLACTIN" Lab Results  Component Value Date   CHOL 173 09/08/2021   TRIG 110 09/08/2021   HDL 50 09/08/2021   CHOLHDL 3.5 09/08/2021   VLDL 22 09/08/2021   LDLCALC 101 (H) 09/08/2021   LDLCALC 102 (H) 06/21/2020   Physical Findings:  AIMS: Facial and Oral Movements Muscles of Facial Expression: None, normal Lips and Perioral Area: None, normal Jaw: None, normal Tongue: None, normal,Extremity Movements Upper (arms, wrists, hands, fingers): None, normal Lower (legs, knees, ankles, toes): None, normal, Trunk Movements Neck, shoulders, hips: None, normal, Overall Severity Severity of abnormal movements (highest score from questions above): None, normal Incapacitation due to abnormal movements: None, normal Patient's awareness of abnormal movements (rate only patient's report): No Awareness, Dental Status Current problems with teeth and/or dentures?: No Does patient usually wear dentures?: No  No Cogwheeling or  Rigidity Present.  Musculoskeletal: Strength & Muscle Tone: within normal limits Gait & Station: normal Patient leans: N/A  Psychiatric Specialty Exam:  Presentation  General Appearance: unkempt appearing - wearing hair bonnet and multiple layers of clothing  Eye Contact:Good  Speech:rambling but normal fluency  Speech Volume:Normal  Mood and Affect  Mood: euthymic  Affect: constricted, odd  Thought Process  Thought Processes: perseverative and ruminative; concrete  Orientation:Partial  Thought Content: Continues to have baseline, fixed delusions but does not mention these unless prompted with questions; is not grossly responding to internal/external stimuli on exam; denies SI, HI, AVH or paranoia and denies ideas of reference or first rank symptoms but is guarded and  perseverative about need for continued admission   Hallucinations:Denied   Ideas of Reference:Denied  Suicidal Thoughts:Denied   Homicidal Thoughts:Denied   Sensorium  Memory:Limited   Judgment:Impaired  Insight:Poor  Executive Functions  Concentration:Poor  Attention Span:Poor  [place]  Psychomotor Activity  Psychomotor Activity:No cogwheeling, no stiffness, no tremor, AIMS 0 (7/20)   Assets  Assets:Social Support  Sleep  Fair   Physical Exam Constitutional:      Appearance: the patient is not toxic-appearing.  Pulmonary:     Effort: Pulmonary effort is normal.  Neurological:     General: No focal deficit present.   Review of Systems  Respiratory:  Negative for shortness of breath.   Cardiovascular:  Negative for chest pain.  Gastrointestinal:  Negative for abdominal pain, constipation, diarrhea, nausea and vomiting.  Neurological:  Negative for headaches.    Blood pressure 117/84, pulse (!) 106, temperature 97.8 F (36.6 C), temperature source Oral, resp. rate 16, height 5' 6"  (1.676 m), weight 72.6 kg, SpO2 100 %. Body  mass index is 25.83 kg/m.  Treatment Plan Summary: Daily contact with patient to assess and evaluate symptoms and progress in treatment and Medication management  Safety and Monitoring: Involuntary admission to inpatient psychiatric unit for safety, stabilization and treatment Daily contact with patient to assess and evaluate symptoms and progress in treatment Patient's case to be discussed in multi-disciplinary team meeting Observation Level : q15 minute checks Vital signs: q12 hours Precautions: safety   Bipolar I MRE manic with psychotic features (r/o schizoaffective d/o - bipolar type) Major neurocognitive disorder with behavioral disturbance (marked for age brain atrophy on CT head and MOCA 11/30) At this time, the patient's mania has resolved with medications and she appears to have residual, fixed delusions. It is unclear if she has had a bipolar or schizoaffective d/o presentation prior to this hospitalization in the past, but overall the mania she had on admission has resolved and her mood stability and behavioral control are improved at this time on medications. Given her brain atrophy and cognitive testing scores, she appears to have a major neurocognitive d/o of unclear etiology also present.   -Reduce Haldol to 5 mg AM and 5 mg QHS (decrease gradually) given patient's prolonged stability and goal to reduce antipsychotic polypharmacy after discussion with guardian (Discussed with guardian the black box warning for use of antipsychotics in patient's with dementia and guardian ideally would like the team to transition to monotherapy if possible - advised if her mania returns or she has decompensation with dose reduction in Haldol that we would have to readdress medication regimen with guardian at that time) -Continue Cogentin 0.5 mg twice daily - may be able to reduce medication need as Haldol is discontinued  -Invega Sustenna 234 mg IM given on 09-30-21, 154m IM given 10/12/20 - Invega  78 mg given on 7/21, reduced dose given due to CrCl of 54 after consultation with pharmacist regarding monthly maintenance dose and consent obtained from guardian for medication administration - AIMS on 7/20 of 0  -- Zyprexa/Geodon/ativan agitation protocol PRN  -Additional psychosis labs (HIV- , RPR-, ANA-, heavy metal WNL, ESR wnl, ceruloplasmin wnl and CT head shows Marked for age brain atrophy.  No acute or reversible finding.)   -Depakote, Ativan, and Zyprexa stopped - ACTT referral pending - Recommend Neurology f/u after discharge for dementia evaluation/management - Interim guardianship in place with mother and waiting on SSDI and medicaid for group home referral    Hyperammonemia -  resolved -Ammonia level 30 on 10/27/21 -Depakote was stopped    Mildly elevated creatinine  -Creatinine of 1.22 on 7/18    Season allergies -Continue Claritin 63m daily PRN and d/c flonase at request of guardian    Urinary frequency- resolved -repeat UA with rare bacteria, but other wise, WNL  Discharge Planning: Social work and case management to assist with discharge planning and identification of hospital follow-up needs prior to discharge Discharge Concerns: Need to establish a safety plan; Medication compliance and effectiveness Discharge Goals: Return home with outpatient referrals for mental health follow-up including medication management/psychotherapy  Labs On Admission- CMP: WNL except Cr: 1.1,  GFR: 60,  CBC: WNL,  UDS: Neg,  Troponin: 3,  EtOH: Neg,  beta hCG: 6.4,  Resp Panel: Neg 5/29- Urine Preg: Neg,  A1c: 5.3,  TSH: 1.719,  Lipid Panel: WNL except LDL: 101 5/24- BMP: WNL except Cr: 1.01,  Ca: 8.4,  hCG: 5,  Urine Preg: Neg 5/28- Ammonia: 32,  CBC: WNL,  CMP: WNL except  Cr: 1.08,  Ca: 8.6,  Depakote lvl: 20 6/3- Depakote lvl: 64, Hep Function: WNL,  CBC: WNL except Hem: 11.9 6/7- Depakote lvl: 75 6/18- UA: Neg,  CBC: WNL,  Depakote lvl: 136,  Ammonia: 99,  BMP: WNL except BUN: 22,   Cr: 1.19,  Ca: 8.6,  GFR: 54,  Hep Function: WNL except Albu: 3.3,  Indirect Bili: 0.2 6/20- ANA: Neg,  Ammonia: 48,  Ceruloplasmin: 24.2,  CMP: WNL except BUN: 23,  Cr: 1.35,  Ca: 8.3,  Albu: 3.4  HIV:Neg,  RPR: Neg,  Sed Rate: 6,  Depakote lvl: 25,  Heavy Metal: Neg 6/23- Ammonia: 46,  BMP: WNL except Cr: 1.2,  Ca: 8.3,  GFR: 54 6/24- UA: WNL except Bacteria: rare 6/30- CMP:  WNL except Cr: 1.04,  Ca: 8.6,  CBC: WNL,  Ammonia: 41, EKG: NSR with Qtc: 399 7/8 - CMP WNL other than CR 1.33 and GFR 48; Ammonia 30 7/18 - Cr of 1.22, eGFR of 53 7/23 - EKG with Qtc of 4Au Gres MD PGY-2

## 2021-11-14 NOTE — Group Note (Signed)
Topic: Self Esteem  Due to the acuity and complex discharge plans, group was not held. Patient was provided therapeutic worksheets and asked to meet with CSW as needed.    Scarlette Hogston, LCSW, LCAS Clincal Social Worker  [facility]  

## 2021-11-14 NOTE — Progress Notes (Signed)
Pt stated she uses [employer] at home for her muscles , pt encouraged to talk to the doctor

## 2021-11-14 NOTE — Group Note (Signed)
Recreation Therapy Group Note   Group Topic:Healthy Decision Making  Group Date: 11/14/2021 Start Time: 1000 End Time: 1050 Facilitators: Caroll Rancher, LRT,CTRS Location: [address] Dayroom   Goal Area(s) Addresses:  Patient will effectively work with peer towards shared goal.  Patient will identify factors that guided their decision making.  Patient will pro-socially communicate ideas during group session.    Group Description:  Patients were given a scenario that they were going to be stranded on a deserted [place] for several months before being rescued. Writer tasked them with making a list of 15 things they would choose to bring with them for "survival". The list of items was prioritized most important to least. Each patient would come up with their own list, then work together to create a new list of 15 items while in a group of 3-5 peers. LRT discussed each person's list and how it differed from others. The debrief included discussion of priorities, good decisions versus bad decisions, and how it is important to think before acting so we can make the best decision possible. LRT tied the concept of effective communication among group members to patient's support systems outside of the hospital and its benefit post discharge.   Affect/Mood: Flat   Participation Level: Engaged   Participation Quality: Independent   Behavior: Appropriate   Speech/Thought Process: Disorganized   Insight: Impaired   Judgement: Impaired   Modes of Intervention: Group work   Patient Response to Interventions:  Engaged   Education Outcome:  Acknowledges education and In group clarification offered    Clinical Observations/Individualized Feedback: Pt was engaged but confused.  Pt constantly repeated the same things over and over. Pt expressed some of the things she would take with her on the [place] are luggage, bathing suit, pictures, tell her family to get her mail and best memories.  Pt also  expressed taking a Consulting civil engineer on [address] but couldn't understand why an [place] would not work.  Pt was also preoccupied with her luggage and repeated buying it on [employer] and it being filled with everything she needs.    Plan: Continue to engage patient in RT group sessions 2-3x/week.   Caroll Rancher, LRT,CTRS 11/14/2021 1:13 PM

## 2021-11-14 NOTE — Progress Notes (Signed)
   11/14/21 2300  Psych Admission Type (Psych Patients Only)  Admission Status Involuntary  Psychosocial Assessment  Patient Complaints Suspiciousness  Eye Contact Poor  Facial Expression Anxious  Affect Anxious  Speech Soft  Interaction Cautious  Motor Activity Slow  Appearance/Hygiene Disheveled  Behavior Characteristics Cooperative  Mood Preoccupied  Aggressive Behavior  Effect No apparent injury  Thought Process  Coherency Disorganized  Content Preoccupation  Delusions WDL  Perception WDL  Hallucination None reported or observed  Judgment Poor  Confusion Mild  Danger to Self  Current suicidal ideation? Denies  Danger to Others  Danger to Others None reported or observed

## 2021-11-14 NOTE — Progress Notes (Signed)
   11/14/21 0623  Sleep  Number of Hours 9.25

## 2021-11-14 NOTE — BHH Counselor (Signed)
CSW spoke with Nyra Jabs, medicaid [phone] and verified with her that they have all needed paperwork for a medicaid claim.  Ms. Sherlie Ban informed social worker that they are just waiting for disability to kick in so that medicaid can be applied   CSW called [facility] case worker, Onnie Boer, [phone] ext [phone].  He reports that application has been submitted, and they requested for additional paperwork to be signed by patient.  [facility] case worker will come to hospital at [facility] for patient to sign additional paperwork tomorrow.  Case worker also informed this Child psychotherapist that he is worried that they will not make a decision on her disability until she is out of the hospital.  CSW let case worker know about patient not being able to leave due to safety concerns and no place to go but a shelter.  Case Worker reports that cases in the past have had DSS involvement and they were able to get transitional housing so patient could get approved.  CSW let him know that patient APS case was closed due to her having a guardian.   Caseworker agrees to problem solve with this Child psychotherapist tomorrow when he comes for patient to sign paperwork.    Nakai Pollio, LCSW, LCAS Clincal Social Worker  [facility]

## 2021-11-14 NOTE — Progress Notes (Signed)
   11/14/21 1400  Psych Admission Type (Psych Patients Only)  Admission Status Involuntary  Psychosocial Assessment  Patient Complaints Suspiciousness  Eye Contact Poor  Facial Expression Anxious  Affect Anxious  Speech Soft  Interaction Cautious  Motor Activity Slow  Appearance/Hygiene Disheveled  Behavior Characteristics Cooperative  Mood Preoccupied  Aggressive Behavior  Effect No apparent injury  Thought Process  Coherency Disorganized  Content Preoccupation  Delusions WDL  Perception WDL  Hallucination None reported or observed  Judgment Poor  Confusion Mild  Danger to Self  Current suicidal ideation? Denies  Danger to Others  Danger to Others None reported or observed

## 2021-11-14 NOTE — Progress Notes (Signed)
Pt sleep much of the evening, too lethargic to take medication, pt woke up in the middle of the night confused and refused to take HS medication, pt stated she would take it at 6 am, pt informed it would be too close to her 8 am medication

## 2021-11-14 NOTE — Progress Notes (Signed)
Psychoeducational Group Note  Date:  11/14/2021 Time:  2000  Group Topic/Focus:  Wrap up group  Participation Level: Did Not Attend  Participation Quality:  Not Applicable  Affect:  Not Applicable  Cognitive:  Not Applicable  Insight:  Not Applicable  Engagement in Group: Not Applicable  Additional Comments:  Did not attend.   Marcille Buffy 11/14/2021, 8:56 PM

## 2021-11-15 DIAGNOSIS — F312 Bipolar disorder, current episode manic severe with psychotic features: Secondary | ICD-10-CM | POA: Diagnosis not present

## 2021-11-15 NOTE — Progress Notes (Signed)
   11/15/21 1300  Psych Admission Type (Psych Patients Only)  Admission Status Involuntary  Psychosocial Assessment  Patient Complaints Suspiciousness  Eye Contact Poor  Facial Expression Anxious  Affect Anxious  Speech Soft  Interaction Cautious  Motor Activity Slow  Appearance/Hygiene Disheveled  Behavior Characteristics Cooperative  Mood Preoccupied  Thought Process  Coherency Disorganized  Content Preoccupation  Delusions WDL  Perception WDL  Hallucination None reported or observed  Judgment Poor  Confusion Mild  Danger to Self  Current suicidal ideation? Denies  Danger to Others  Danger to Others None reported or observed

## 2021-11-15 NOTE — Progress Notes (Signed)
   11/15/21 0515  Sleep  Number of Hours 7.75

## 2021-11-15 NOTE — Progress Notes (Signed)
[facility] MD Progress Note  11/15/2021 12:52 PM Natalie Brown  MRN:  [id number]  Reason For Admission: Natalie Brown is a 56 yr old female who presented on 5/17 to [facility] with complaints of anxiety and feeling overwhelmed and SI without a plan, she was admitted to [facility] on 5/19.  PPHx is significant for possible Bipolar Disorder.  Yesterday, the psychiatry team made following recommendations: Decrease Haldol to 5 mg BID to reduce polypharmacy  Interval History: PRN Medications administered within the last 24 hours: Tylenol, colace, atarax x1, milk of mag x1, nicorette x1 Per nursing staff: patient has been pre-occupied and confused  Per Patient:  On assessment today, the patient appears similar to previous days. She reports a mood that is "I am okay, God is with me".  She shows this provider her involuntary commitment paperwork several times during the interview.  She asks "why is this still here? ".  She continues to talk about wanting her "federal phone ".  She reports that she urinated on herself last night around 2 AM.  She states that this has never occurred before.  She denies any urinary symptoms such as dysuria or urinary frequency.  She denies experiencing suicidal or homicidal thoughts.  She denies experiencing auditory or visual hallucinations.  She reports appropriate sleep and appetite.  Patient denies side effects to current scheduled psychiatric medications.   Patient denies other somatic complaints.   Principal Problem: Bipolar affective disorder, current episode manic with psychotic symptoms ([place]) Diagnosis: Principal Problem:   Bipolar affective disorder, current episode manic with psychotic symptoms ([place]) Active Problems:   Major neurocognitive disorder ([place])   Past Psychiatric History: Probable bipolar disorder  Past Medical History:  Past Medical History:  Diagnosis Date   Bipolar 1 disorder with moderate mania ([place]) 12/30/2013   Hypertension    Family History: see  H&P  Family Psychiatric  History: Patient reports mental illness runs in her father's side of the family  Social History:  Social History   Substance and Sexual Activity  Alcohol Use No     Social History   Substance and Sexual Activity  Drug Use No    Social History   Socioeconomic History   Marital status: Married    Spouse name: Not on file   Number of children: Not on file   Years of education: Not on file   Highest education level: Not on file  Occupational History   Not on file  Tobacco Use   Smoking status: Every Day   Smokeless tobacco: Current  Substance and Sexual Activity   Alcohol use: No   Drug use: No   Sexual activity: Not on file  Other Topics Concern   Not on file  Social History Narrative   Not on file   Social Determinants of Health   Financial Resource Strain: Not on file  Food Insecurity: Not on file  Transportation Needs: Not on file  Physical Activity: Not on file  Stress: Not on file  Social Connections: Not on file   Additional Social History:   Sleep: Good  Appetite:  Good  Current Medications: Current Facility-Administered Medications  Medication Dose Route Frequency Provider Last Rate Last Admin   acetaminophen (TYLENOL) tablet 650 mg  650 mg Oral Q6H PRN Briant Cedar, MD   650 mg at 11/14/21 0759   alum & mag hydroxide-simeth (MAALOX/MYLANTA) 200-200-20 MG/5ML suspension 30 mL  30 mL Oral Q4H PRN Janine Limbo, MD       benztropine (  COGENTIN) tablet 0.5 mg  0.5 mg Oral BID Armando Reichert, MD   0.5 mg at 11/15/21 0830   docusate sodium (COLACE) capsule 100 mg  100 mg Oral Daily PRN Corky Sox, MD   100 mg at 11/14/21 2044   feeding supplement (KATE FARMS STANDARD 1.4) liquid 325 mL  325 mL Oral BID BM Massengill, [name], MD   325 mL at 11/14/21 1400   haloperidol (HALDOL) tablet 5 mg  5 mg Oral Daily Nelda Marseille, Amy E, MD   5 mg at 11/15/21 0830   haloperidol (HALDOL) tablet 5 mg  5 mg Oral QHS Corky Sox,  MD   5 mg at 11/14/21 2042   hydrocerin (EUCERIN) cream   Topical BID Janine Limbo, MD   Given at 11/14/21 1706   hydrOXYzine (ATARAX) tablet 25 mg  25 mg Oral TID PRN Janine Limbo, MD   25 mg at 11/14/21 2042   LORazepam (ATIVAN) tablet 1 mg  1 mg Oral Q6H PRN Harlow Asa, MD   1 mg at 10/07/21 1546   magnesium hydroxide (MILK OF MAGNESIA) suspension 30 mL  30 mL Oral Daily PRN Janine Limbo, MD   30 mL at 11/14/21 0802   nicotine polacrilex (NICORETTE) gum 2 mg  2 mg Oral PRN Dian Situ, MD   2 mg at 11/14/21 2043   OLANZapine zydis (ZYPREXA) disintegrating tablet 5 mg  5 mg Oral Q8H PRN Harlow Asa, MD   5 mg at 10/14/21 1416   And   ziprasidone (GEODON) injection 20 mg  20 mg Intramuscular PRN Harlow Asa, MD       Lab Results:  No results found for this or any previous visit (from the past 23 hour(s)).    Blood Alcohol level:  Lab Results  Component Value Date   ETH <10 16/38/4665   Metabolic Disorder Labs: Lab Results  Component Value Date   HGBA1C 5.3 09/08/2021   MPG 105.41 09/08/2021   No results found for: "PROLACTIN" Lab Results  Component Value Date   CHOL 173 09/08/2021   TRIG 110 09/08/2021   HDL 50 09/08/2021   CHOLHDL 3.5 09/08/2021   VLDL 22 09/08/2021   LDLCALC 101 (H) 09/08/2021   LDLCALC 102 (H) 06/21/2020   Physical Findings:  AIMS: Facial and Oral Movements Muscles of Facial Expression: None, normal Lips and Perioral Area: None, normal Jaw: None, normal Tongue: None, normal,Extremity Movements Upper (arms, wrists, hands, fingers): None, normal Lower (legs, knees, ankles, toes): None, normal, Trunk Movements Neck, shoulders, hips: None, normal, Overall Severity Severity of abnormal movements (highest score from questions above): None, normal Incapacitation due to abnormal movements: None, normal Patient's awareness of abnormal movements (rate only patient's report): No Awareness, Dental Status Current problems  with teeth and/or dentures?: No Does patient usually wear dentures?: No  No Cogwheeling or Rigidity Present.  Musculoskeletal: Strength & Muscle Tone: within normal limits Gait & Station: normal Patient leans: N/A  Psychiatric Specialty Exam:  Presentation  General Appearance: unkempt appearing - wearing hair bonnet and multiple layers of clothing  Eye Contact:Good  Speech:rambling but normal fluency  Speech Volume:Normal  Mood and Affect  Mood: euthymic  Affect: constricted, odd  Thought Process  Thought Processes: perseverative and ruminative; concrete  Orientation:Partial  Thought Content: Continues to have baseline, fixed delusions but does not mention these unless prompted with questions; is not grossly responding to internal/external stimuli on exam; denies SI, HI, AVH or paranoia and denies ideas of reference or first  rank symptoms but is guarded and perseverative about need for continued admission   Hallucinations:Denied   Ideas of Reference:Denied  Suicidal Thoughts:Denied   Homicidal Thoughts:Denied   Sensorium  Memory:Limited   Judgment:Impaired  Insight:Poor  Executive Functions  Concentration:Poor  Attention Span:Poor  [place]  Psychomotor Activity  Psychomotor Activity:No cogwheeling, no stiffness, no tremor, AIMS 0 (7/20)   Assets  Assets:Social Support  Sleep  Fair   Physical Exam Constitutional:      Appearance: the patient is not toxic-appearing.  Pulmonary:     Effort: Pulmonary effort is normal.  Neurological:     General: No focal deficit present.   Review of Systems  Respiratory:  Negative for shortness of breath.   Cardiovascular:  Negative for chest pain.  Gastrointestinal:  Negative for abdominal pain, constipation, diarrhea, nausea and vomiting.  Neurological:  Negative for headaches.    Blood pressure 127/82, pulse (!) 137, temperature 98.8 F (37.1 C),  temperature source Oral, resp. rate 16, height 5' 6"  (1.676 m), weight 72.6 kg, SpO2 100 %. Body mass index is 25.83 kg/m.  Treatment Plan Summary: Daily contact with patient to assess and evaluate symptoms and progress in treatment and Medication management  Safety and Monitoring: Involuntary admission to inpatient psychiatric unit for safety, stabilization and treatment Daily contact with patient to assess and evaluate symptoms and progress in treatment Patient's case to be discussed in multi-disciplinary team meeting Observation Level : q15 minute checks Vital signs: q12 hours Precautions: safety   Bipolar I MRE manic with psychotic features (r/o schizoaffective d/o - bipolar type) Major neurocognitive disorder with behavioral disturbance (marked for age brain atrophy on CT head and MOCA 11/30) At this time, the patient's mania has resolved with medications and she appears to have residual, fixed delusions. It is unclear if she has had a bipolar or schizoaffective d/o presentation prior to this hospitalization in the past, but overall the mania she had on admission has resolved and her mood stability and behavioral control are improved at this time on medications. Given her brain atrophy and cognitive testing scores, she appears to have a major neurocognitive d/o of unclear etiology also present.   -Continue Haldol 5 mg AM and 5 mg QHS (decrease gradually) given patient's prolonged stability and goal to reduce antipsychotic polypharmacy after discussion with guardian (Discussed with guardian the black box warning for use of antipsychotics in patient's with dementia and guardian ideally would like the team to transition to monotherapy if possible - advised if her mania returns or she has decompensation with dose reduction in Haldol that we would have to readdress medication regimen with guardian at that time) - Plan to reduce to 2.5 mg BID 7/29 -Continue Cogentin 0.5 mg twice daily - may be able  to reduce medication need as Haldol is discontinued  -Invega Sustenna 234 mg IM given on 09-30-21, 160m IM given 10/12/20 - Invega 78 mg given on 7/21, reduced dose given due to CrCl of 54 after consultation with pharmacist regarding monthly maintenance dose and consent obtained from guardian for medication administration - AIMS on 7/20 of 0  -- Zyprexa/Geodon/ativan agitation protocol PRN  -Additional psychosis labs (HIV- , RPR-, ANA-, heavy metal WNL, ESR wnl, ceruloplasmin wnl and CT head shows Marked for age brain atrophy.  No acute or reversible finding.)   -Depakote, Ativan, and Zyprexa stopped - ACTT referral pending - Recommend Neurology f/u after discharge for dementia evaluation/management - Interim guardianship in place with mother  and waiting on SSDI and medicaid for group home referral    Hyperammonemia -resolved -Ammonia level 30 on 10/27/21 -Depakote was stopped    Mildly elevated creatinine  -Creatinine of 1.22 on 7/18    Season allergies -Continue Claritin 61m daily PRN and d/c flonase at request of guardian    Urinary frequency- resolved -repeat UA with rare bacteria, but other wise, WNL  Bedwetting episode (7/26) -May be related to progressing dementia, low suspicion for UTI given lack of urinary symptoms, will get UA if this recurs  Discharge Planning: Social work and case management to assist with discharge planning and identification of Brown follow-up needs prior to discharge Discharge Concerns: Need to establish a safety plan; Medication compliance and effectiveness Discharge Goals: Return home with outpatient referrals for mental health follow-up including medication management/psychotherapy  Labs On Admission- CMP: WNL except Cr: 1.1,  GFR: 60,  CBC: WNL,  UDS: Neg,  Troponin: 3,  EtOH: Neg,  beta hCG: 6.4,  Resp Panel: Neg 5/29- Urine Preg: Neg,  A1c: 5.3,  TSH: 1.719,  Lipid Panel: WNL except LDL: 101 5/24- BMP: WNL except Cr: 1.01,  Ca: 8.4,  hCG: 5,   Urine Preg: Neg 5/28- Ammonia: 32,  CBC: WNL,  CMP: WNL except  Cr: 1.08,  Ca: 8.6,  Depakote lvl: 20 6/3- Depakote lvl: 64, Hep Function: WNL,  CBC: WNL except Hem: 11.9 6/7- Depakote lvl: 75 6/18- UA: Neg,  CBC: WNL,  Depakote lvl: 136,  Ammonia: 99,  BMP: WNL except BUN: 22,  Cr: 1.19,  Ca: 8.6,  GFR: 54,  Hep Function: WNL except Albu: 3.3,  Indirect Bili: 0.2 6/20- ANA: Neg,  Ammonia: 48,  Ceruloplasmin: 24.2,  CMP: WNL except BUN: 23,  Cr: 1.35,  Ca: 8.3,  Albu: 3.4  HIV:Neg,  RPR: Neg,  Sed Rate: 6,  Depakote lvl: 25,  Heavy Metal: Neg 6/23- Ammonia: 46,  BMP: WNL except Cr: 1.2,  Ca: 8.3,  GFR: 54 6/24- UA: WNL except Bacteria: rare 6/30- CMP:  WNL except Cr: 1.04,  Ca: 8.6,  CBC: WNL,  Ammonia: 41, EKG: NSR with Qtc: 399 7/8 - CMP WNL other than CR 1.33 and GFR 48; Ammonia 30 7/18 - Cr of 1.22, eGFR of 53 7/23 - EKG with Qtc of 4[place] MD PGY-2

## 2021-11-15 NOTE — Progress Notes (Signed)
   11/15/21 2300  Psych Admission Type (Psych Patients Only)  Admission Status Involuntary  Psychosocial Assessment  Patient Complaints Suspiciousness  Eye Contact Poor  Facial Expression Anxious  Affect Anxious  Speech Soft  Interaction Cautious  Motor Activity Slow  Appearance/Hygiene Disheveled  Behavior Characteristics Cooperative  Mood Preoccupied  Aggressive Behavior  Effect No apparent injury  Thought Process  Coherency Disorganized  Content Preoccupation  Delusions WDL  Perception WDL  Hallucination None reported or observed  Judgment Poor  Confusion Mild  Danger to Self  Current suicidal ideation? Denies  Danger to Others  Danger to Others None reported or observed

## 2021-11-15 NOTE — [facility] Group Notes (Signed)
Adult Psychoeducational Group Note  Date:  11/15/2021 Time:  2:16 PM  Group Topic/Focus:  Goals Group:   The focus of this group is to help patients establish daily goals to achieve during treatment and discuss how the patient can incorporate goal setting into their daily lives to aide in recovery.  Participation Level:  Active  Participation Quality:  Appropriate  Affect:  Appropriate  Cognitive:  Appropriate  Insight: Appropriate  Engagement in Group:  Engaged  Modes of Intervention:  Discussion  Additional Comments:  Patient attended morning orientation  group and participated.   Natalie Brown Natalie Brown 11/15/2021, 2:16 PM

## 2021-11-15 NOTE — Group Note (Signed)
Recreation Therapy Group Note   Group Topic:Team Building  Group Date: 11/15/2021 Start Time: 1000 End Time: 1045 Facilitators: Caroll Rancher, LRT,CTRS Location: [address] Dayroom   Goal Area(s) Addresses:  Patient will effectively work with peer towards shared goal.  Patient will identify skills used to make activity successful.  Patient will share challenges and verbalize solution-driven approaches used. Patient will identify how skills used during activity can be used to reach post d/c goals.   Group Description: [employer]. Patients were provided the following materials: 5 drinking straws, 5 rubber bands, 5 paper clips, 2 index cards, 2 drinking cups, and 2 toilet paper rolls. Using the provided materials patients were asked to build a launching mechanism to launch a ping pong ball across the room, approximately 10 feet. Patients were divided into teams of 3-5. Instructions required all materials be incorporated into the device, functionality of items left to the peer group's discretion.   Affect/Mood: N/A   Participation Level: Did not attend    Clinical Observations/Individualized Feedback:    Plan: Continue to engage patient in RT group sessions 2-3x/week.   Caroll Rancher, LRT,CTRS  11/15/2021 11:47 AM

## 2021-11-15 NOTE — Progress Notes (Signed)
Psychoeducational Group Note  Date:  11/15/2021 Time:  2000  Group Topic/Focus:  Wrap up group  Participation Level: Did Not Attend  Participation Quality:  Not Applicable  Affect:  Not Applicable  Cognitive:  Not Applicable  Insight:  Not Applicable  Engagement in Group: Not Applicable  Additional Comments:  Did not attend.  [name] S 11/15/2021, 8:22 PM

## 2021-11-16 ENCOUNTER — Encounter (HOSPITAL_COMMUNITY): Payer: Self-pay

## 2021-11-16 DIAGNOSIS — F312 Bipolar disorder, current episode manic severe with psychotic features: Secondary | ICD-10-CM | POA: Diagnosis not present

## 2021-11-16 MED ORDER — HALOPERIDOL 2 MG PO TABS
2.0000 mg | ORAL_TABLET | Freq: Two times a day (BID) | ORAL | Status: DC
  Administered 2021-11-16 – 2021-11-20 (×8): 2 mg via ORAL
  Filled 2021-11-16 (×10): qty 1

## 2021-11-16 NOTE — BH IP Treatment Plan (Signed)
Interdisciplinary Treatment and Diagnostic Plan Update  11/16/2021 Time of Session: 10:00am  Natalie Brown MRN: [id number]  Principal Diagnosis: Bipolar affective disorder, current episode manic with psychotic symptoms ([place])  Secondary Diagnoses: Principal Problem:   Bipolar affective disorder, current episode manic with psychotic symptoms ([place]) Active Problems:   Major neurocognitive disorder ([place])   Current Medications:  Current Facility-Administered Medications  Medication Dose Route Frequency Provider Last Rate Last Admin   acetaminophen (TYLENOL) tablet 650 mg  650 mg Oral Q6H PRN Briant Cedar, MD   650 mg at 11/14/21 0759   alum & mag hydroxide-simeth (MAALOX/MYLANTA) 200-200-20 MG/5ML suspension 30 mL  30 mL Oral Q4H PRN Massengill, [name], MD       benztropine (COGENTIN) tablet 0.5 mg  0.5 mg Oral BID Armando Reichert, MD   0.5 mg at 11/16/21 0803   docusate sodium (COLACE) capsule 100 mg  100 mg Oral Daily PRN Corky Sox, MD   100 mg at 11/15/21 2043   feeding supplement (KATE FARMS STANDARD 1.4) liquid 325 mL  325 mL Oral BID BM Massengill, [name], MD   325 mL at 11/14/21 1400   haloperidol (HALDOL) tablet 5 mg  5 mg Oral Daily Nelda Marseille, Amy E, MD   5 mg at 11/16/21 [id number]   haloperidol (HALDOL) tablet 5 mg  5 mg Oral QHS Corky Sox, MD   5 mg at 11/15/21 2042   hydrocerin (EUCERIN) cream   Topical BID Janine Limbo, MD   1 Application at [id number]   hydrOXYzine (ATARAX) tablet 25 mg  25 mg Oral TID PRN Janine Limbo, MD   25 mg at 11/15/21 2042   LORazepam (ATIVAN) tablet 1 mg  1 mg Oral Q6H PRN Harlow Asa, MD   1 mg at 10/07/21 1546   magnesium hydroxide (MILK OF MAGNESIA) suspension 30 mL  30 mL Oral Daily PRN Janine Limbo, MD   30 mL at 11/14/21 0802   nicotine polacrilex (NICORETTE) gum 2 mg  2 mg Oral PRN Winfred Leeds, Nadir, MD   2 mg at 11/15/21 2042   OLANZapine zydis (ZYPREXA) disintegrating tablet 5 mg  5 mg Oral Q8H PRN  Harlow Asa, MD   5 mg at 10/14/21 1416   And   ziprasidone (GEODON) injection 20 mg  20 mg Intramuscular PRN Harlow Asa, MD       PTA Medications: Medications Prior to Admission  Medication Sig Dispense Refill Last Dose   CLARITIN 10 MG tablet Take 10 mg by mouth daily.      fluticasone (FLONASE) 50 MCG/ACT nasal spray Place 1 spray into both nostrils daily.      pseudoephedrine-guaifenesin (MUCINEX D) 60-600 MG 12 hr tablet Take 1 tablet by mouth every 12 (twelve) hours. 30 tablet 0     Patient Stressors: Health problems   Medication change or noncompliance    Patient Strengths: Capable of independent living  Hydrographic surveyor for treatment/growth  Supportive family/friends   Treatment Modalities: Medication Management, Group therapy, Case management,  1 to 1 session with clinician, Psychoeducation, Recreational therapy.   Physician Treatment Plan for Primary Diagnosis: Bipolar affective disorder, current episode manic with psychotic symptoms ([place]) Long Term Goal(s): Improvement in symptoms so as ready for discharge   Short Term Goals: Ability to identify and develop effective coping behaviors will improve Ability to maintain clinical measurements within normal limits will improve Compliance with prescribed medications will improve Ability to identify changes in lifestyle to reduce recurrence of condition  will improve Ability to verbalize feelings will improve Ability to disclose and discuss suicidal ideas Ability to demonstrate self-control will improve  Medication Management: Evaluate patient's response, side effects, and tolerance of medication regimen.  Therapeutic Interventions: 1 to 1 sessions, Unit Group sessions and Medication administration.  Evaluation of Outcomes: Progressing  Physician Treatment Plan for Secondary Diagnosis: Principal Problem:   Bipolar affective disorder, current episode manic with psychotic symptoms ([facility]) Active  Problems:   Major neurocognitive disorder ([facility])  Long Term Goal(s): Improvement in symptoms so as ready for discharge   Short Term Goals: Ability to identify and develop effective coping behaviors will improve Ability to maintain clinical measurements within normal limits will improve Compliance with prescribed medications will improve Ability to identify changes in lifestyle to reduce recurrence of condition will improve Ability to verbalize feelings will improve Ability to disclose and discuss suicidal ideas Ability to demonstrate self-control will improve     Medication Management: Evaluate patient's response, side effects, and tolerance of medication regimen.  Therapeutic Interventions: 1 to 1 sessions, Unit Group sessions and Medication administration.  Evaluation of Outcomes: Progressing   RN Treatment Plan for Primary Diagnosis: Bipolar affective disorder, current episode manic with psychotic symptoms ([facility]) Long Term Goal(s): Knowledge of disease and therapeutic regimen to maintain health will improve  Short Term Goals: Ability to remain free from injury will improve, Ability to participate in decision making will improve, Ability to verbalize feelings will improve, Ability to disclose and discuss suicidal ideas, and Ability to identify and develop effective coping behaviors will improve  Medication Management: RN will administer medications as ordered by provider, will assess and evaluate patient's response and provide education to patient for prescribed medication. RN will report any adverse and/or side effects to prescribing provider.  Therapeutic Interventions: 1 on 1 counseling sessions, Psychoeducation, Medication administration, Evaluate responses to treatment, Monitor vital signs and CBGs as ordered, Perform/monitor CIWA, COWS, AIMS and Fall Risk screenings as ordered, Perform wound care treatments as ordered.  Evaluation of Outcomes: Progressing   LCSW Treatment Plan  for Primary Diagnosis: Bipolar affective disorder, current episode manic with psychotic symptoms ([facility]) Long Term Goal(s): Safe transition to appropriate next level of care at discharge, Engage patient in therapeutic group addressing interpersonal concerns.  Short Term Goals: Engage patient in aftercare planning with referrals and resources, Increase social support, Increase emotional regulation, Facilitate acceptance of mental health diagnosis and concerns, Identify triggers associated with mental health/substance abuse issues, and Increase skills for wellness and recovery  Therapeutic Interventions: Assess for all discharge needs, 1 to 1 time with Social worker, Explore available resources and support systems, Assess for adequacy in community support network, Educate family and significant other(s) on suicide prevention, Complete Psychosocial Assessment, Interpersonal group therapy.  Evaluation of Outcomes: Progressing   Progress in Treatment: Attending groups: Yes. Participating in groups: Yes. Taking medication as prescribed: Yes. Toleration medication: Yes. Family/Significant other contact made: Yes, individual(s) contacted:  mother, guardian and sister Patient understands diagnosis: No. Discussing patient identified problems/goals with staff: Yes. Medical problems stabilized or resolved: Yes. Denies suicidal/homicidal ideation: Yes. Issues/concerns per patient self-inventory: No.     New problem(s) identified: No, Describe:  none reported    New Short Term/Long Term Goal(s):  medication stabilization, elimination of SI thoughts, development of comprehensive mental wellness plan.      Patient Goals: No additional goals identified at this time. Patient to continue to work towards original goals identified in initial treatment team meeting. CSW will remain available to patient  should they voice additional treatment goals.    Discharge Plan or Barriers: Patient currently is not  connected with benefits and is not able to access safe placement.  CSW working alongside community partners to assist patient with accessing benefits for safe placement.    Reason for Continuation of Hospitalization: Delusions  Mania Medication stabilization   Estimated Length of Stay: TBD   Last 3 [place] Suicide Severity Risk Score: Flowsheet Row Ad[facility] (Current) [facility] from 09[facility]05/2021 in [facility] ED from 05/26/2020 in [facility]  C-SSRS RISK CATEGORY High Risk High Risk No Risk       Last PHQ 2/9 Scores:    05/26/2020    2:14 PM 09/03/2017    4:34 PM 08/07/2016    2:12 PM  Depression screen PHQ 2/9  Decreased Interest 3 3 3   Down, Depressed, Hopeless 3 1 2   PHQ - 2 Score 6 4 5   Altered sleeping 0 3 3  Tired, decreased energy 0 3 0  Change in appetite 3 3 0  Feeling bad or failure about yourself  0 3 1  Trouble concentrating 3 0 0  Moving slowly or fidgety/restless 3 0 1  Suicidal thoughts 0 0 0  PHQ-9 Score 15 16 10     Scribe for Treatment Team: , 11/16/2021 11:18 AM

## 2021-11-16 NOTE — [facility] Group Notes (Signed)
Adult Psychoeducational Group Note  Date:  11/16/2021 Time:  5:12 PM  Group Topic/Focus:  Goals Group:   The focus of this group is to help patients establish daily goals to achieve during treatment and discuss how the patient can incorporate goal setting into their daily lives to aide in recovery.  Participation Level:  Active  Participation Quality:  Appropriate  Affect:  Appropriate  Cognitive:  Appropriate  Insight: Appropriate  Engagement in Group:  Engaged  Modes of Intervention:  Discussion  Additional Comments:  Patient attended morning orientation group and participated.   Natalie Brown 11/16/2021, 5:12 PM

## 2021-11-16 NOTE — Group Note (Signed)
LCSW Group Therapy Note   Group Date: 11/16/2021 Start Time: 1300 End Time: 1400   Type of Therapy and Topic:  Group Therapy: Coping Skills Safe Space  Participation Level:  Did not attend  Description of Group: Pts will define what a safe space looks like to them and be able to describe sensations and feelings associated with that space.  Patients will be able to participate in imagery exercise.  Patients then provided opportunity to describe unique safe spaces.   Patients discussed when they can use this coping skill to assist with feelings and situations after discharge.    Therapeutic Goals:  DBT Meditations and Grounding  Solution Based Therapy     Summary of Patient Progress:   Did not attend   Therapeutic Modalities:   Gardiner Sleeper Krislyn Donnan, LCSW 11/16/2021  2:00 PM

## 2021-11-16 NOTE — [facility] Group Notes (Signed)
Spirituality group facilitated by Chaplain Katy Ryhanna Dunsmore, BCC.  ? ?Group Description: Group focused on topic of hope. Patients participated in facilitated discussion around topic, connecting with one another around experiences and definitions for hope. Group members engaged with visual explorer photos, reflecting on what hope looks like for them today. Group engaged in discussion around how their definitions of hope are present today in hospital.  ? ?Modalities: Psycho-social ed, Adlerian, Narrative, MI  ? ?Patient Progress: did not attend. ?

## 2021-11-16 NOTE — Group Note (Signed)
Recreation Therapy Group Note   Group Topic:Communication  Group Date: 11/16/2021 Start Time: 1000 End Time: 1045 Facilitators: Caroll Rancher, LRT,CTRS Location: [address] Dayroom   Goal Area(s) Addresses:  Patient will effectively listen to complete activity.  Patient will identify communication skills used to make activity successful.  Patient will identify how skills used during activity can be used to reach post d/c goals.    Group Description:  Geometric Drawings.  Three volunteers from the peer group will be shown an abstract picture with a particular arrangement of geometrical shapes.  Each round, one 'speaker' will describe the pattern, as accurately as possible without revealing the image to the group.  The remaining group members will listen and draw the picture to reflect how it is described to them. Patients with the role of 'listener' cannot ask clarifying questions but, may request that the speaker repeat a direction. Once the drawings are complete, the presenter will show the rest of the group the picture and compare how close each person came to drawing the picture. LRT will facilitate a post-activity discussion regarding effective communication and the importance of planning, listening, and asking for clarification in daily interactions with others.   Affect/Mood: N/A   Participation Level: Did not attend    Clinical Observations/Individualized Feedback:     Plan: Continue to engage patient in RT group sessions 2-3x/week.   Caroll Rancher, Antonietta Jewel 11/16/2021 12:13 PM

## 2021-11-16 NOTE — Progress Notes (Signed)
   11/16/21 0530  Sleep  Number of Hours 7.5

## 2021-11-16 NOTE — Progress Notes (Signed)
[facility] MD Progress Note  11/16/2021 1:37 PM Natalie Brown  MRN:  [id number]  Reason For Admission: Natalie Brown is a 56 yr old female who presented on 5/17 to [facility] with complaints of anxiety and feeling overwhelmed and SI without a plan, she was admitted to [facility] on 5/19.  PPHx is significant for possible Bipolar Disorder.  Yesterday, the psychiatry team made following recommendations: Continue Haldol 5 mg BID, gradual reduction to reduce polypharmacy  Interval History: PRN Medications administered within the last 24 hours: colace, atarax x1, milk of mag x1, nicorette x1 Per nursing staff: patient has been disheveled as well as pre-occupied and confused  Per Patient:  On assessment today, the patient appears similar to previous days.  She continues to show this provider her IVC paperwork on each interaction.  She says "I am not supposed to be here past 9/20 ".  She continues to ask about her "federal phone".  She reports appropriate sleep and appetite.  She states that her mood is "good".  She denies suicidal homicidal thoughts.  She denies auditory or visual hallucinations.  She denies any recurrence of bedwetting over this previous night.  Patient denies side effects to current scheduled psychiatric medications.   Patient denies other somatic complaints.   Principal Problem: Bipolar affective disorder, current episode manic with psychotic symptoms ([place]) Diagnosis: Principal Problem:   Bipolar affective disorder, current episode manic with psychotic symptoms ([place]) Active Problems:   Major neurocognitive disorder ([place])   Past Psychiatric History: Probable bipolar disorder  Past Medical History:  Past Medical History:  Diagnosis Date   Bipolar 1 disorder with moderate mania ([place]) 12/30/2013   Hypertension    Family History: see H&P  Family Psychiatric  History: Patient reports mental illness runs in her father's side of the family  Social History:  Social History   Substance  and Sexual Activity  Alcohol Use No     Social History   Substance and Sexual Activity  Drug Use No    Social History   Socioeconomic History   Marital status: Married    Spouse name: Not on file   Number of children: Not on file   Years of education: Not on file   Highest education level: Not on file  Occupational History   Not on file  Tobacco Use   Smoking status: Every Day   Smokeless tobacco: Current  Substance and Sexual Activity   Alcohol use: No   Drug use: No   Sexual activity: Not on file  Other Topics Concern   Not on file  Social History Narrative   Not on file   Social Determinants of Health   Financial Resource Strain: Not on file  Food Insecurity: Not on file  Transportation Needs: Not on file  Physical Activity: Not on file  Stress: Not on file  Social Connections: Not on file   Additional Social History:   Sleep: Good  Appetite:  Good  Current Medications: Current Facility-Administered Medications  Medication Dose Route Frequency Provider Last Rate Last Admin   acetaminophen (TYLENOL) tablet 650 mg  650 mg Oral Q6H PRN Briant Cedar, MD   650 mg at 11/14/21 0759   alum & mag hydroxide-simeth (MAALOX/MYLANTA) 200-200-20 MG/5ML suspension 30 mL  30 mL Oral Q4H PRN Massengill, [name], MD       benztropine (COGENTIN) tablet 0.5 mg  0.5 mg Oral BID Armando Reichert, MD   0.5 mg at 11/16/21 0803   docusate sodium (COLACE) capsule 100  mg  100 mg Oral Daily PRN Corky Sox, MD   100 mg at 11/15/21 2043   feeding supplement (KATE FARMS STANDARD 1.4) liquid 325 mL  325 mL Oral BID BM Massengill, [name], MD   325 mL at 11/14/21 1400   haloperidol (HALDOL) tablet 5 mg  5 mg Oral Daily Nelda Marseille, Amy E, MD   5 mg at 11/16/21 5597   haloperidol (HALDOL) tablet 5 mg  5 mg Oral QHS Corky Sox, MD   5 mg at 11/15/21 2042   hydrocerin (EUCERIN) cream   Topical BID Janine Limbo, MD   1 Application at 41/63/84 5364   hydrOXYzine (ATARAX) tablet  25 mg  25 mg Oral TID PRN Janine Limbo, MD   25 mg at 11/15/21 2042   LORazepam (ATIVAN) tablet 1 mg  1 mg Oral Q6H PRN Harlow Asa, MD   1 mg at 10/07/21 1546   magnesium hydroxide (MILK OF MAGNESIA) suspension 30 mL  30 mL Oral Daily PRN Janine Limbo, MD   30 mL at 11/14/21 0802   nicotine polacrilex (NICORETTE) gum 2 mg  2 mg Oral PRN Dian Situ, MD   2 mg at 11/15/21 2042   OLANZapine zydis (ZYPREXA) disintegrating tablet 5 mg  5 mg Oral Q8H PRN Harlow Asa, MD   5 mg at 10/14/21 1416   And   ziprasidone (GEODON) injection 20 mg  20 mg Intramuscular PRN Harlow Asa, MD       Lab Results:  No results found for this or any previous visit (from the past 31 hour(s)).    Blood Alcohol level:  Lab Results  Component Value Date   ETH <10 68/06/2120   Metabolic Disorder Labs: Lab Results  Component Value Date   HGBA1C 5.3 09/08/2021   MPG 105.41 09/08/2021   No results found for: "PROLACTIN" Lab Results  Component Value Date   CHOL 173 09/08/2021   TRIG 110 09/08/2021   HDL 50 09/08/2021   CHOLHDL 3.5 09/08/2021   VLDL 22 09/08/2021   LDLCALC 101 (H) 09/08/2021   LDLCALC 102 (H) 06/21/2020   Physical Findings:  AIMS: Facial and Oral Movements Muscles of Facial Expression: None, normal Lips and Perioral Area: None, normal Jaw: None, normal Tongue: None, normal,Extremity Movements Upper (arms, wrists, hands, fingers): None, normal Lower (legs, knees, ankles, toes): None, normal, Trunk Movements Neck, shoulders, hips: None, normal, Overall Severity Severity of abnormal movements (highest score from questions above): None, normal Incapacitation due to abnormal movements: None, normal Patient's awareness of abnormal movements (rate only patient's report): No Awareness, Dental Status Current problems with teeth and/or dentures?: No Does patient usually wear dentures?: No  No Cogwheeling or Rigidity Present.  Musculoskeletal: Strength & Muscle  Tone: within normal limits Gait & Station: normal Patient leans: N/A  Psychiatric Specialty Exam:  Presentation  General Appearance: unkempt appearing - wearing hair bonnet and multiple layers of clothing  Eye Contact:Good  Speech:rambling but normal fluency  Speech Volume:Normal  Mood and Affect  Mood: euthymic  Affect: constricted, odd  Thought Process  Thought Processes: perseverative and ruminative; concrete  Orientation:Partial  Thought Content: Continues to have baseline, fixed delusions but does not mention these unless prompted with questions; is not grossly responding to internal/external stimuli on exam; denies SI, HI, AVH or paranoia and denies ideas of reference or first rank symptoms but is guarded and perseverative about need for continued admission   Hallucinations:Denied   Ideas of Reference:Denied  Suicidal Thoughts:Denied  Homicidal Thoughts:Denied   Sensorium  Memory:Limited   Judgment:Impaired  Insight:Poor  Executive Functions  Concentration:Poor  Attention Span:Poor  [place]  Psychomotor Activity  Psychomotor Activity:No cogwheeling, no stiffness, no tremor, AIMS 0 (7/20)   Assets  Assets:Social Support  Sleep  Fair   Physical Exam Constitutional:      Appearance: the patient is not toxic-appearing.  Pulmonary:     Effort: Pulmonary effort is normal.  Neurological:     General: No focal deficit present.   Review of Systems  Respiratory:  Negative for shortness of breath.   Cardiovascular:  Negative for chest pain.  Gastrointestinal:  Negative for abdominal pain, constipation, diarrhea, nausea and vomiting.  Neurological:  Negative for headaches.    Blood pressure 123/83, pulse (!) 103, temperature 98.4 F (36.9 C), temperature source Oral, resp. rate 16, height 5' 6"  (1.676 m), weight 72.6 kg, SpO2 100 %. Body mass index is 25.83 kg/m.  Treatment Plan  Summary: Daily contact with patient to assess and evaluate symptoms and progress in treatment and Medication management  Safety and Monitoring: Involuntary admission to inpatient psychiatric unit for safety, stabilization and treatment Daily contact with patient to assess and evaluate symptoms and progress in treatment Patient's case to be discussed in multi-disciplinary team meeting Observation Level : q15 minute checks Vital signs: q12 hours Precautions: safety   Bipolar I MRE manic with psychotic features (r/o schizoaffective d/o - bipolar type) Major neurocognitive disorder with behavioral disturbance (marked for age brain atrophy on CT head and MOCA 11/30) At this time, the patient's mania has resolved with medications and she appears to have residual, fixed delusions. It is unclear if she has had a bipolar or schizoaffective d/o presentation prior to this hospitalization in the past, but overall the mania she had on admission has resolved and her mood stability and behavioral control are improved at this time on medications. Given her brain atrophy and cognitive testing scores, she appears to have a major neurocognitive d/o of unclear etiology also present.   -Decrease Haldol to 2 mg AM and 2 mg QHS (decrease gradually) given patient's prolonged stability and goal to reduce antipsychotic polypharmacy after discussion with guardian (Discussed with guardian the black box warning for use of antipsychotics in patient's with dementia and guardian ideally would like the team to transition to monotherapy if possible - advised if her mania returns or she has decompensation with dose reduction in Haldol that we would have to readdress medication regimen with guardian at that time) -Continue Cogentin 0.5 mg twice daily - may be able to reduce medication need as Haldol is discontinued  -Invega Sustenna 234 mg IM given on 09-30-21, 127m IM given 10/12/20 - Invega 78 mg given on 7/21, reduced dose given due  to CrCl of 54 after consultation with pharmacist regarding monthly maintenance dose and consent obtained from guardian for medication administration - AIMS on 7/20 of 0  -- Zyprexa/Geodon/ativan agitation protocol PRN  -Additional psychosis labs (HIV- , RPR-, ANA-, heavy metal WNL, ESR wnl, ceruloplasmin wnl and CT head shows Marked for age brain atrophy.  No acute or reversible finding.)   -Depakote, Ativan, and Zyprexa stopped - ACTT referral pending - Recommend Neurology f/u after discharge for dementia evaluation/management - Interim guardianship in place with mother and waiting on SSDI and medicaid for group home referral    Hyperammonemia -resolved -Ammonia level 30 on 10/27/21 -Depakote was stopped    Mildly elevated creatinine  -Creatinine of 1.22  on 7/18    Season allergies -Continue Claritin 70m daily PRN and d/c flonase at request of guardian    Urinary frequency- resolved -repeat UA with rare bacteria, but other wise, WNL  Bedwetting episode (7/26) -May be related to progressing dementia, low suspicion for UTI given lack of urinary symptoms, will get UA if this recurs  Discharge Planning: Social work and case management to assist with discharge planning and identification of hospital follow-up needs prior to discharge Discharge Concerns: Need to establish a safety plan; Medication compliance and effectiveness Discharge Goals: Return home with outpatient referrals for mental health follow-up including medication management/psychotherapy LCSW NWinferd Humphreyto meet with patient today regarding disability  Labs On Admission- CMP: WNL except Cr: 1.1,  GFR: 60,  CBC: WNL,  UDS: Neg,  Troponin: 3,  EtOH: Neg,  beta hCG: 6.4,  Resp Panel: Neg 5/29- Urine Preg: Neg,  A1c: 5.3,  TSH: 1.719,  Lipid Panel: WNL except LDL: 101 5/24- BMP: WNL except Cr: 1.01,  Ca: 8.4,  hCG: 5,  Urine Preg: Neg 5/28- Ammonia: 32,  CBC: WNL,  CMP: WNL except  Cr: 1.08,  Ca: 8.6,  Depakote lvl: 20 6/3-  Depakote lvl: 64, Hep Function: WNL,  CBC: WNL except Hem: 11.9 6/7- Depakote lvl: 75 6/18- UA: Neg,  CBC: WNL,  Depakote lvl: 136,  Ammonia: 99,  BMP: WNL except BUN: 22,  Cr: 1.19,  Ca: 8.6,  GFR: 54,  Hep Function: WNL except Albu: 3.3,  Indirect Bili: 0.2 6/20- ANA: Neg,  Ammonia: 48,  Ceruloplasmin: 24.2,  CMP: WNL except BUN: 23,  Cr: 1.35,  Ca: 8.3,  Albu: 3.4  HIV:Neg,  RPR: Neg,  Sed Rate: 6,  Depakote lvl: 25,  Heavy Metal: Neg 6/23- Ammonia: 46,  BMP: WNL except Cr: 1.2,  Ca: 8.3,  GFR: 54 6/24- UA: WNL except Bacteria: rare 6/30- CMP:  WNL except Cr: 1.04,  Ca: 8.6,  CBC: WNL,  Ammonia: 41, EKG: NSR with Qtc: 399 7/8 - CMP WNL other than CR 1.33 and GFR 48; Ammonia 30 7/18 - Cr of 1.22, eGFR of 53 7/23 - EKG with Qtc of 4[place] MD PGY-2

## 2021-11-16 NOTE — Progress Notes (Signed)
Adult Psychoeducational Group Note  Date:  11/16/2021 Time:  9:14 PM  Group Topic/Focus:  Wrap-Up Group:   The focus of this group is to help patients review their daily goal of treatment and discuss progress on daily workbooks.  Participation Level:  Active  Participation Quality:  Appropriate  Affect:  Appropriate  Cognitive:  Appropriate  Insight: Appropriate  Engagement in Group:  Engaged  Modes of Intervention:  Discussion  Additional Comments:  Pt stated she had a good day.  Pt stated her goal for the day was to fill out paperwork.  Pt met goal.  Elise Benne 11/16/2021, 9:14 PM

## 2021-11-17 DIAGNOSIS — F312 Bipolar disorder, current episode manic severe with psychotic features: Principal | ICD-10-CM

## 2021-11-17 NOTE — Progress Notes (Signed)
   11/17/21 2100  Psych Admission Type (Psych Patients Only)  Admission Status Involuntary  Psychosocial Assessment  Patient Complaints Suspiciousness  Eye Contact Fair  Facial Expression Anxious  Affect Anxious;Preoccupied  Speech Soft  Interaction Cautious  Motor Activity Slow  Appearance/Hygiene Disheveled  Behavior Characteristics Cooperative  Mood Suspicious;Preoccupied;Pleasant  Aggressive Behavior  Effect No apparent injury  Thought Process  Coherency Disorganized;Circumstantial  Content Preoccupation  Delusions WDL  Perception WDL  Hallucination None reported or observed  Judgment Poor  Confusion Mild  Danger to Self  Current suicidal ideation? Denies

## 2021-11-17 NOTE — Progress Notes (Signed)
[facility] MD Progress Note  11/17/2021 11:09 AM KRISHA BEEGLE  MRN:  [id number]  Reason For Admission: Lyrick Worland is a 56 yr old female who presented on 5/17 to [facility] with complaints of anxiety and feeling overwhelmed and SI without a plan, she was admitted to [facility] on 5/19.  PPHx is significant for possible Bipolar Disorder.  Yesterday, the psychiatry team made following recommendations: Continue Haldol 2 mg BID.  Interval History: PRN Medications administered within the last 24 hours: colace, atarax x1, milk of mag x1, nicorette x 1 Per nursing staff: patient has been disheveled as well as pre-occupied and confused  Per Patient:  On assessment today, the patient appears similar to previous days. There have not been much changes in her mood.  She has the tendency to show the providers her IVC paperwork on each interaction, but today, that was not case. She reports appropriate sleep and appetite.  She states that her mood is "good".  She denies suicidal homicidal thoughts.  She denies auditory or visual hallucinations.  She denies any recurrence of bedwetting over this previous night. She told this provider that she informed the psych tech that she did not want to be woken up till 12 noon today. She did not mention anything about getting discharge. Denies any new issues or concerns.  Patient denies side effects to current scheduled psychiatric medications.   Patient denies other somatic complaints.   Principal Problem: Bipolar affective disorder, current episode manic with psychotic symptoms ([place]) Diagnosis: Principal Problem:   Bipolar affective disorder, current episode manic with psychotic symptoms ([place]) Active Problems:   Major neurocognitive disorder ([place])   Past Psychiatric History: Probable bipolar disorder  Past Medical History:  Past Medical History:  Diagnosis Date   Bipolar 1 disorder with moderate mania ([place]) 12/30/2013   Hypertension    Family History: see H&P  Family  Psychiatric  History: Patient reports mental illness runs in her father's side of the family  Social History:  Social History   Substance and Sexual Activity  Alcohol Use No     Social History   Substance and Sexual Activity  Drug Use No    Social History   Socioeconomic History   Marital status: Married    Spouse name: Not on file   Number of children: Not on file   Years of education: Not on file   Highest education level: Not on file  Occupational History   Not on file  Tobacco Use   Smoking status: Every Day   Smokeless tobacco: Current  Substance and Sexual Activity   Alcohol use: No   Drug use: No   Sexual activity: Not on file  Other Topics Concern   Not on file  Social History Narrative   Not on file   Social Determinants of Health   Financial Resource Strain: Not on file  Food Insecurity: Not on file  Transportation Needs: Not on file  Physical Activity: Not on file  Stress: Not on file  Social Connections: Not on file   Additional Social History:   Sleep: Good  Appetite:  Good  Current Medications: Current Facility-Administered Medications  Medication Dose Route Frequency Provider Last Rate Last Admin   acetaminophen (TYLENOL) tablet 650 mg  650 mg Oral Q6H PRN Briant Cedar, MD   650 mg at 11/14/21 0759   alum & mag hydroxide-simeth (MAALOX/MYLANTA) 200-200-20 MG/5ML suspension 30 mL  30 mL Oral Q4H PRN Janine Limbo, MD  benztropine (COGENTIN) tablet 0.5 mg  0.5 mg Oral BID Armando Reichert, MD   0.5 mg at 11/17/21 0804   docusate sodium (COLACE) capsule 100 mg  100 mg Oral Daily PRN Corky Sox, MD   100 mg at 11/16/21 2037   feeding supplement (KATE FARMS STANDARD 1.4) liquid 325 mL  325 mL Oral BID BM Massengill, [name], MD   325 mL at 11/17/21 0909   haloperidol (HALDOL) tablet 2 mg  2 mg Oral BID Corky Sox, MD   2 mg at 11/17/21 0803   hydrocerin (EUCERIN) cream   Topical BID Janine Limbo, MD   1 Application at  79/89/21 1941   hydrOXYzine (ATARAX) tablet 25 mg  25 mg Oral TID PRN Janine Limbo, MD   25 mg at 11/16/21 2036   LORazepam (ATIVAN) tablet 1 mg  1 mg Oral Q6H PRN Harlow Asa, MD   1 mg at 10/07/21 1546   magnesium hydroxide (MILK OF MAGNESIA) suspension 30 mL  30 mL Oral Daily PRN Janine Limbo, MD   30 mL at 11/14/21 0802   nicotine polacrilex (NICORETTE) gum 2 mg  2 mg Oral PRN Dian Situ, MD   2 mg at 11/16/21 2036   OLANZapine zydis (ZYPREXA) disintegrating tablet 5 mg  5 mg Oral Q8H PRN Harlow Asa, MD   5 mg at 10/14/21 1416   And   ziprasidone (GEODON) injection 20 mg  20 mg Intramuscular PRN Harlow Asa, MD       Lab Results:  No results found for this or any previous visit (from the past 68 hour(s)).    Blood Alcohol level:  Lab Results  Component Value Date   ETH <10 74/11/1446   Metabolic Disorder Labs: Lab Results  Component Value Date   HGBA1C 5.3 09/08/2021   MPG 105.41 09/08/2021   No results found for: "PROLACTIN" Lab Results  Component Value Date   CHOL 173 09/08/2021   TRIG 110 09/08/2021   HDL 50 09/08/2021   CHOLHDL 3.5 09/08/2021   VLDL 22 09/08/2021   LDLCALC 101 (H) 09/08/2021   LDLCALC 102 (H) 06/21/2020   Physical Findings:  AIMS: Facial and Oral Movements Muscles of Facial Expression: None, normal Lips and Perioral Area: None, normal Jaw: None, normal Tongue: None, normal,Extremity Movements Upper (arms, wrists, hands, fingers): None, normal Lower (legs, knees, ankles, toes): None, normal, Trunk Movements Neck, shoulders, hips: None, normal, Overall Severity Severity of abnormal movements (highest score from questions above): None, normal Incapacitation due to abnormal movements: None, normal Patient's awareness of abnormal movements (rate only patient's report): No Awareness, Dental Status Current problems with teeth and/or dentures?: No Does patient usually wear dentures?: No  No Cogwheeling or Rigidity  Present.  Musculoskeletal: Strength & Muscle Tone: within normal limits Gait & Station: normal Patient leans: N/A  Psychiatric Specialty Exam:  Presentation  General Appearance: unkempt appearing - wearing hair bonnet and multiple layers of clothing  Eye Contact:Good  Speech:rambling but normal fluency  Speech Volume:Normal  Mood and Affect  Mood: euthymic  Affect: constricted, odd  Thought Process  Thought Processes: perseverative and ruminative; concrete  Orientation:Partial  Thought Content: Continues to have baseline, fixed delusions but does not mention these unless prompted with questions; is not grossly responding to internal/external stimuli on exam; denies SI, HI, AVH or paranoia and denies ideas of reference or first rank symptoms but is guarded and perseverative about need for continued admission   Hallucinations:Denied  Ideas of Reference:Denied  Suicidal  Thoughts:Denied   Homicidal Thoughts:Denied   Sensorium  Memory:Limited   Judgment:Impaired  Insight:Poor  Executive Functions  Concentration:Poor  Attention Span:Poor  [place]  Psychomotor Activity  Psychomotor Activity:No cogwheeling, no stiffness, no tremor, AIMS 0 (7/20)   Assets  Assets:Social Support  Sleep  Fair   Physical Exam Constitutional:      Appearance: the patient is not toxic-appearing.  Pulmonary:     Effort: Pulmonary effort is normal.  Neurological:     General: No focal deficit present.   Review of Systems  Respiratory:  Negative for shortness of breath.   Cardiovascular:  Negative for chest pain.  Gastrointestinal:  Negative for abdominal pain, constipation, diarrhea, nausea and vomiting.  Neurological:  Negative for headaches.    Blood pressure (!) 123/92, pulse (!) 110, temperature 98.2 F (36.8 C), temperature source Oral, resp. rate 16, height _0  (1.676 m), weight 72.6 kg, SpO2 100 %. Body mass  index is 25.83 kg/m.  Treatment Plan Summary: Daily contact with patient to assess and evaluate symptoms and progress in treatment and Medication management  Safety and Monitoring: Involuntary admission to inpatient psychiatric unit for safety, stabilization and treatment Daily contact with patient to assess and evaluate symptoms and progress in treatment Patient's case to be discussed in multi-disciplinary team meeting Observation Level : q15 minute checks Vital signs: q12 hours Precautions: safety   Bipolar I MRE manic with psychotic features (r/o schizoaffective d/o - bipolar type) Major neurocognitive disorder with behavioral disturbance (marked for age brain atrophy on CT head and MOCA 11/30) At this time, the patient's mania has resolved with medications and she appears to have residual, fixed delusions. It is unclear if she has had a bipolar or schizoaffective d/o presentation prior to this hospitalization in the past, but overall the mania she had on admission has resolved and her mood stability and behavioral control are improved at this time on medications. Given her brain atrophy and cognitive testing scores, she appears to have a major neurocognitive d/o of unclear etiology also present.   -Decrease Haldol to 2 mg AM and 2 mg QHS (decrease gradually) given patient's prolonged stability and goal to reduce antipsychotic polypharmacy after discussion with guardian (Discussed with guardian the black box warning for use of antipsychotics in patient's with dementia and guardian ideally would like the team to transition to monotherapy if possible - advised if her mania returns or she has decompensation with dose reduction in Haldol that we would have to readdress medication regimen with guardian at that time) -Continue Cogentin 0.5 mg twice daily - may be able to reduce medication need as Haldol is discontinued  -Invega Sustenna 234 mg IM given on 09-30-21, 155m IM given 10/12/20 - Invega 78  mg given on 7/21, reduced dose given due to CrCl of 54 after consultation with pharmacist regarding monthly maintenance dose and consent obtained from guardian for medication administration - AIMS on 7/20 of 0  -- Zyprexa/Geodon/ativan agitation protocol PRN  -Additional psychosis labs (HIV- , RPR-, ANA-, heavy metal WNL, ESR wnl, ceruloplasmin wnl and CT head shows Marked for age brain atrophy.  No acute or reversible finding.)   -Depakote, Ativan, and Zyprexa stopped - ACTT referral pending - Recommend Neurology f/u after discharge for dementia evaluation/management - Interim guardianship in place with mother and waiting on SSDI and medicaid for group home referral    Hyperammonemia -resolved -Ammonia level 30 on 10/27/21 -Depakote was stopped    Mildly elevated creatinine  -  Creatinine of 1.22 on 7/18    Season allergies -Continue Claritin 78m daily PRN and d/c flonase at request of guardian    Urinary frequency- resolved -repeat UA with rare bacteria, but other wise, WNL  Bedwetting episode (7/26) -May be related to progressing dementia, low suspicion for UTI given lack of urinary symptoms, will get UA if this recurs  Discharge Planning: Social work and case management to assist with discharge planning and identification of hospital follow-up needs prior to discharge Discharge Concerns: Need to establish a safety plan; Medication compliance and effectiveness Discharge Goals: Return home with outpatient referrals for mental health follow-up including medication management/psychotherapy LCSW NWinferd Humphreyto meet with patient today regarding disability  Labs On Admission- CMP: WNL except Cr: 1.1,  GFR: 60,  CBC: WNL,  UDS: Neg,  Troponin: 3,  EtOH: Neg,  beta hCG: 6.4,  Resp Panel: Neg 5/29- Urine Preg: Neg,  A1c: 5.3,  TSH: 1.719,  Lipid Panel: WNL except LDL: 101 5/24- BMP: WNL except Cr: 1.01,  Ca: 8.4,  hCG: 5,  Urine Preg: Neg 5/28- Ammonia: 32,  CBC: WNL,  CMP: WNL except  Cr:  1.08,  Ca: 8.6,  Depakote lvl: 20 6/3- Depakote lvl: 64, Hep Function: WNL,  CBC: WNL except Hem: 11.9 6/7- Depakote lvl: 75 6/18- UA: Neg,  CBC: WNL,  Depakote lvl: 136,  Ammonia: 99,  BMP: WNL except BUN: 22,  Cr: 1.19,  Ca: 8.6,  GFR: 54,  Hep Function: WNL except Albu: 3.3,  Indirect Bili: 0.2 6/20- ANA: Neg,  Ammonia: 48,  Ceruloplasmin: 24.2,  CMP: WNL except BUN: 23,  Cr: 1.35,  Ca: 8.3,  Albu: 3.4  HIV:Neg,  RPR: Neg,  Sed Rate: 6,  Depakote lvl: 25,  Heavy Metal: Neg 6/23- Ammonia: 46,  BMP: WNL except Cr: 1.2,  Ca: 8.3,  GFR: 54 6/24- UA: WNL except Bacteria: rare 6/30- CMP:  WNL except Cr: 1.04,  Ca: 8.6,  CBC: WNL,  Ammonia: 41, EKG: NSR with Qtc: 399 7/8 - CMP WNL other than CR 1.33 and GFR 48; Ammonia 30 7/18 - Cr of 1.22, eGFR of 53 7/23 - EKG with Qtc of 4[place] pmhnp, fnp-bc. Patient ID: AJAYSIE BENTHALL female   DOB: 1December 30, 1967 56y.o.   MRN: [id number]

## 2021-11-17 NOTE — Progress Notes (Signed)
   11/17/21 0515  Sleep  Number of Hours 7

## 2021-11-17 NOTE — Group Note (Signed)
LCSW Group Therapy Note  11/17/2021     Type of Therapy and Topic:  Group Therapy: Anger and Coping Skills  Participation Level:  None   Description of Group:   In this group, patients learned how to recognize the physical, cognitive, emotional, and behavioral responses they have to anger-provoking situations.  They identified how they usually or often react when angered, and learned how healthy and unhealthy coping skills work initially, but the unhealthy ones stop working.   They analyzed how their frequently-chosen coping skill is possibly beneficial and how it is possibly unhelpful.  The group discussed a variety of healthier coping skills that could help in resolving the actual issues, as well as how to go about planning for the the possibility of future similar situations.  Therapeutic Goals: Patients will identify one thing that makes them angry and how they feel emotionally and physically, what their thoughts are or tend to be in those situations, and what healthy or unhealthy coping mechanism they typically use Patients will identify how their coping technique works for them, as well as how it works against them. Patients will explore possible new behaviors to use in future anger situations. Patients will learn that anger itself is normal and cannot be eliminated, and that healthier coping skills can assist with resolving conflict rather than worsening situations.  Summary of Patient Progress:  The patient was late to group and when she did speak up was completely off topic.  She left early as well.  Therapeutic Modalities:   Cognitive Behavioral Therapy   Lynnell Chad  .

## 2021-11-17 NOTE — [facility] Group Notes (Signed)
Pt did not attend group. 

## 2021-11-18 NOTE — [facility] Group Notes (Signed)
Pt did not attend Psychoeducational group. 

## 2021-11-18 NOTE — Progress Notes (Signed)
[facility] MD Progress Note  11/18/2021 9:02 AM Natalie BORDEAUX  MRN:  [id number]  Reason For Admission: Natalie Brown is a 56 yr old female who presented on 5/17 to [facility] with complaints of anxiety and feeling overwhelmed and SI without a plan, she was admitted to [facility] on 5/19.  PPHx is significant for possible Bipolar Disorder.  Two days ago, the psychiatry team made following recommendations: Continue Haldol 2 mg BID.  Interval History: PRN Medications administered within the last 24 hours: colace, atarax x1, milk of mag x1, nicorette x 1 Per nursing staff: patient has been disheveled as well as pre-occupied and confused  Per Patient:  On assessment today, the patient appears similar to previous days. There have not been much changes in her mood.  She did show this providers her IVC paperwork today, pointing out that she would not like to miss the court date or she will get in trouble. [name] instructed & informed that even if she misses the court date, she will not be held responsible or penalized because she is in the hospital. She also states that she feels she has stayed long enough in the hospital & should be discharged by now. She says she has a home to go to after discharge (her sister's or her mother's home). She was informed that the discharge plan is still to make sure she does not end-up homeless after discharge. She reports appropriate sleep and appetite.  She states that her mood is "good".  She denies suicidal homicidal thoughts.  She denies auditory or visual hallucinations.  She denies any recurrence of bedwetting over this previous night. She reports that she threw-up at the cafeteria this morning after breakfast. Felt she ate the wrong thing. Denies any nausea at this time. Says she is having bowel movements on daily basis. Denies any heart burn at this time. Says her boyfriend visited her yesterday evening. Is happy that he came to see her. Denies any other issues or concerns. Patient  denies side effects to current scheduled psychiatric medications. Patient denies other somatic complaints.  Principal Problem: Bipolar affective disorder, current episode manic with psychotic symptoms ([place]) Diagnosis: Principal Problem:   Bipolar affective disorder, current episode manic with psychotic symptoms ([place]) Active Problems:   Major neurocognitive disorder ([place])  Past Psychiatric History: Probable bipolar disorder  Past Medical History:  Past Medical History:  Diagnosis Date   Bipolar 1 disorder with moderate mania ([place]) 12/30/2013   Hypertension    Family History: see H&P  Family Psychiatric  History: Patient reports mental illness runs in her father's side of the family  Social History:  Social History   Substance and Sexual Activity  Alcohol Use No     Social History   Substance and Sexual Activity  Drug Use No    Social History   Socioeconomic History   Marital status: Married    Spouse name: Not on file   Number of children: Not on file   Years of education: Not on file   Highest education level: Not on file  Occupational History   Not on file  Tobacco Use   Smoking status: Every Day   Smokeless tobacco: Current  Substance and Sexual Activity   Alcohol use: No   Drug use: No   Sexual activity: Not on file  Other Topics Concern   Not on file  Social History Narrative   Not on file   Social Determinants of Health   Financial Resource Strain: Not  on file  Food Insecurity: Not on file  Transportation Needs: Not on file  Physical Activity: Not on file  Stress: Not on file  Social Connections: Not on file   Additional Social History:   Sleep: Good  Appetite:  Good  Current Medications: Current Facility-Administered Medications  Medication Dose Route Frequency Provider Last Rate Last Admin   acetaminophen (TYLENOL) tablet 650 mg  650 mg Oral Q6H PRN Briant Cedar, MD   650 mg at 11/14/21 0759   alum & mag hydroxide-simeth  (MAALOX/MYLANTA) 200-200-20 MG/5ML suspension 30 mL  30 mL Oral Q4H PRN Massengill, [name], MD       benztropine (COGENTIN) tablet 0.5 mg  0.5 mg Oral BID Armando Reichert, MD   0.5 mg at 11/18/21 0748   docusate sodium (COLACE) capsule 100 mg  100 mg Oral Daily PRN Corky Sox, MD   100 mg at 11/17/21 2034   feeding supplement (KATE FARMS STANDARD 1.4) liquid 325 mL  325 mL Oral BID BM Massengill, [name], MD   325 mL at 11/17/21 1406   haloperidol (HALDOL) tablet 2 mg  2 mg Oral BID Corky Sox, MD   2 mg at 11/18/21 7622   hydrocerin (EUCERIN) cream   Topical BID Janine Limbo, MD   1 Application at 63/33/54 1705   hydrOXYzine (ATARAX) tablet 25 mg  25 mg Oral TID PRN Janine Limbo, MD   25 mg at 11/17/21 2032   LORazepam (ATIVAN) tablet 1 mg  1 mg Oral Q6H PRN Harlow Asa, MD   1 mg at 10/07/21 1546   magnesium hydroxide (MILK OF MAGNESIA) suspension 30 mL  30 mL Oral Daily PRN Janine Limbo, MD   30 mL at 11/14/21 0802   nicotine polacrilex (NICORETTE) gum 2 mg  2 mg Oral PRN Winfred Leeds, Nadir, MD   2 mg at 11/17/21 2033   OLANZapine zydis (ZYPREXA) disintegrating tablet 5 mg  5 mg Oral Q8H PRN Harlow Asa, MD   5 mg at 10/14/21 1416   And   ziprasidone (GEODON) injection 20 mg  20 mg Intramuscular PRN Harlow Asa, MD       Lab Results:  No results found for this or any previous visit (from the past 3 hour(s)).    Blood Alcohol level:  Lab Results  Component Value Date   ETH <10 56/25/6389   Metabolic Disorder Labs: Lab Results  Component Value Date   HGBA1C 5.3 09/08/2021   MPG 105.41 09/08/2021   No results found for: "PROLACTIN" Lab Results  Component Value Date   CHOL 173 09/08/2021   TRIG 110 09/08/2021   HDL 50 09/08/2021   CHOLHDL 3.5 09/08/2021   VLDL 22 09/08/2021   LDLCALC 101 (H) 09/08/2021   LDLCALC 102 (H) 06/21/2020   Physical Findings:  AIMS: Facial and Oral Movements Muscles of Facial Expression: None, normal Lips and  Perioral Area: None, normal Jaw: None, normal Tongue: None, normal,Extremity Movements Upper (arms, wrists, hands, fingers): None, normal Lower (legs, knees, ankles, toes): None, normal, Trunk Movements Neck, shoulders, hips: None, normal, Overall Severity Severity of abnormal movements (highest score from questions above): None, normal Incapacitation due to abnormal movements: None, normal Patient's awareness of abnormal movements (rate only patient's report): No Awareness, Dental Status Current problems with teeth and/or dentures?: No Does patient usually wear dentures?: No  No Cogwheeling or Rigidity Present.  Musculoskeletal: Strength & Muscle Tone: within normal limits Gait & Station: normal Patient leans: N/A  Psychiatric Specialty Exam:  Presentation  General Appearance: unkempt appearing - wearing hair bonnet and multiple layers of clothing  Eye Contact:Good  Speech:rambling but normal fluency  Speech Volume:Normal  Mood and Affect  Mood: euthymic  Affect: constricted, odd  Thought Process  Thought Processes: perseverative and ruminative; concrete  Orientation:Partial  Thought Content: Continues to have baseline, fixed delusions but does not mention these unless prompted with questions; is not grossly responding to internal/external stimuli on exam; denies SI, HI, AVH or paranoia and denies ideas of reference or first rank symptoms but is guarded and perseverative about need for continued admission   Hallucinations:Denied  Ideas of Reference:Denied  Suicidal Thoughts:Denied   Homicidal Thoughts:Denied   Sensorium  Memory:Limited   Judgment:Impaired  Insight:Poor  Executive Functions  Concentration:Poor  Attention Span:Poor  [place] of Knowledge:Limited  Language:Fair  Psychomotor Activity  Psychomotor Activity:No cogwheeling, no stiffness, no tremor, AIMS 0 (7/20)   Assets  Assets:Social Support  Sleep  Fair   Physical  Exam Constitutional:      Appearance: the patient is not toxic-appearing.  Pulmonary:     Effort: Pulmonary effort is normal.  Neurological:     General: No focal deficit present.   Review of Systems  Respiratory:  Negative for shortness of breath.   Cardiovascular:  Negative for chest pain.  Gastrointestinal:  Negative for abdominal pain, constipation, diarrhea, nausea and vomiting.  Neurological:  Negative for headaches.   Blood pressure 112/84, pulse (!) 117, temperature 98.5 F (36.9 C), resp. rate 16, height 5' 6"  (1.676 m), weight 72.6 kg, SpO2 100 %. Body mass index is 25.83 kg/m.  Treatment Plan Summary: Daily contact with patient to assess and evaluate symptoms and progress in treatment and Medication management  Safety and Monitoring: Involuntary admission to inpatient psychiatric unit for safety, stabilization and treatment Daily contact with patient to assess and evaluate symptoms and progress in treatment Patient's case to be discussed in multi-disciplinary team meeting Observation Level : q15 minute checks Vital signs: q12 hours Precautions: safety   Bipolar I MRE manic with psychotic features (r/o schizoaffective d/o - bipolar type) Major neurocognitive disorder with behavioral disturbance (marked for age brain atrophy on CT head and MOCA 11/30) At this time, the patient's mania has resolved with medications and she appears to have residual, fixed delusions. It is unclear if she has had a bipolar or schizoaffective d/o presentation prior to this hospitalization in the past, but overall the mania she had on admission has resolved and her mood stability and behavioral control are improved at this time on medications. Given her brain atrophy and cognitive testing scores, she appears to have a major neurocognitive d/o of unclear etiology also present.   -Decrease Haldol to 2 mg AM and 2 mg QHS (decrease gradually) given patient's prolonged stability and goal to reduce  antipsychotic polypharmacy after discussion with guardian (Discussed with guardian the black box warning for use of antipsychotics in patient's with dementia and guardian ideally would like the team to transition to monotherapy if possible - advised if her mania returns or she has decompensation with dose reduction in Haldol that we would have to readdress medication regimen with guardian at that time) -Continue Cogentin 0.5 mg twice daily - may be able to reduce medication need as Haldol is discontinued  -Invega Sustenna 234 mg IM given on 09-30-21, 162m IM given 10/12/20 - Invega 78 mg given on 7/21, reduced dose given due to CrCl of 54 after consultation with pharmacist regarding monthly maintenance dose and  consent obtained from guardian for medication administration - AIMS on 7/20 of 0  -- Zyprexa/Geodon/ativan agitation protocol PRN  -Additional psychosis labs (HIV- , RPR-, ANA-, heavy metal WNL, ESR wnl, ceruloplasmin wnl and CT head shows Marked for age brain atrophy.  No acute or reversible finding.)   -Depakote, Ativan, and Zyprexa stopped - ACTT referral pending - Recommend Neurology f/u after discharge for dementia evaluation/management - Interim guardianship in place with mother and waiting on SSDI and medicaid for group home referral    Hyperammonemia -resolved -Ammonia level 30 on 10/27/21 -Depakote was stopped    Mildly elevated creatinine  -Creatinine of 1.22 on 7/18    Season allergies -Continue Claritin 95m daily PRN and d/c flonase at request of guardian    Urinary frequency- resolved -repeat UA with rare bacteria, but other wise, WNL  Bedwetting episode (7/26) -May be related to progressing dementia, low suspicion for UTI given lack of urinary symptoms, will get UA if this recurs  Discharge Planning: Social work and case management to assist with discharge planning and identification of hospital follow-up needs prior to discharge Discharge Concerns: Need to establish  a safety plan; Medication compliance and effectiveness Discharge Goals: Return home with outpatient referrals for mental health follow-up including medication management/psychotherapy LCSW NWinferd Humphreyto meet with patient today regarding disability  Labs On Admission- CMP: WNL except Cr: 1.1,  GFR: 60,  CBC: WNL,  UDS: Neg,  Troponin: 3,  EtOH: Neg,  beta hCG: 6.4,  Resp Panel: Neg 5/29- Urine Preg: Neg,  A1c: 5.3,  TSH: 1.719,  Lipid Panel: WNL except LDL: 101 5/24- BMP: WNL except Cr: 1.01,  Ca: 8.4,  hCG: 5,  Urine Preg: Neg 5/28- Ammonia: 32,  CBC: WNL,  CMP: WNL except  Cr: 1.08,  Ca: 8.6,  Depakote lvl: 20 6/3- Depakote lvl: 64, Hep Function: WNL,  CBC: WNL except Hem: 11.9 6/7- Depakote lvl: 75 6/18- UA: Neg,  CBC: WNL,  Depakote lvl: 136,  Ammonia: 99,  BMP: WNL except BUN: 22,  Cr: 1.19,  Ca: 8.6,  GFR: 54,  Hep Function: WNL except Albu: 3.3,  Indirect Bili: 0.2 6/20- ANA: Neg,  Ammonia: 48,  Ceruloplasmin: 24.2,  CMP: WNL except BUN: 23,  Cr: 1.35,  Ca: 8.3,  Albu: 3.4  HIV:Neg,  RPR: Neg,  Sed Rate: 6,  Depakote lvl: 25,  Heavy Metal: Neg 6/23- Ammonia: 46,  BMP: WNL except Cr: 1.2,  Ca: 8.3,  GFR: 54 6/24- UA: WNL except Bacteria: rare 6/30- CMP:  WNL except Cr: 1.04,  Ca: 8.6,  CBC: WNL,  Ammonia: 41, EKG: NSR with Qtc: 399 7/8 - CMP WNL other than CR 1.33 and GFR 48; Ammonia 30 7/18 - Cr of 1.22, eGFR of 53 7/23 - EKG with Qtc of 4[place]uI.Pat Patrick pmhnp, fnp-bc. Patient ID: Natalie Brown female   DOB: 101/01/1966 56y.o.   MRN: [id number] ID: Natalie Brown female   DOB: [birth year] 56y.o.   MRN: [id number]

## 2021-11-18 NOTE — Progress Notes (Signed)
Pt denies SI HI, AVH and pain when assessed. Visible in milieu at intervals for scheduled medications, groups, off unit activities to courtyard and meals with prompts. Remains tangential with mild confusion but is easily redirectable. Pt tolerated meals well without further emesis since breakfast. Emotional support, reassurance and encouragement offered to pt to attend to ADLs and to clean up her room as it's litter with old napkins "No, I don't want you to help me. I will do it myself later". Reports she slept well with fair appetite "I don't know if it's something I ate or drank but I vomited. I don't want to vomit in the dinning room at all". Safety checks continues at Q 15 minutes intervals without incident. Emotional support, reassurance and encouragement provided. Pt tolerates all PO intake and medications without discomfort.

## 2021-11-18 NOTE — Progress Notes (Signed)
Psychoeducational Group Note  Date:  11/18/2021 Time:  2000  Group Topic/Focus:  Wrap up group  Participation Level: Did Not Attend  Participation Quality:  Not Applicable  Affect:  Not Applicable  Cognitive:  Not Applicable  Insight:  Not Applicable  Engagement in Group: Not Applicable  Additional Comments:  Did not attend.   [name] S 11/18/2021, 9:08 PM

## 2021-11-18 NOTE — Group Note (Signed)
[facility] LCSW Group Therapy Note  Date/Time:  11/18/2021  11:00AM-12:00PM  Type of Therapy and Topic:  Group Therapy:  Music and Mood  Participation Level:  Minimal   Description of Group: In this process group, members listened to a variety of genres of music and identified that different types of music evoke different responses.  Patients were encouraged to identify music that was soothing for them and music that was energizing for them.  Patients discussed how this knowledge can help with wellness and recovery in various ways including managing depression and anxiety as well as encouraging healthy sleep habits.    Therapeutic Goals: Patients will explore the impact of different varieties of music on mood Patients will verbalize the thoughts they have when listening to different types of music Patients will identify music that is soothing to them as well as music that is energizing to them Patients will discuss how to use this knowledge to assist in maintaining wellness and recovery Patients will explore the use of music as a coping skill  Summary of Patient Progress:  At the beginning of group, patient expressed her mood was "pretty good."  At the end of group, patient expressed her mood was "very positive, that made me feel really good."    Therapeutic Modalities: Solution Focused Brief Therapy Activity   Ambrose Mantle, LCSW

## 2021-11-18 NOTE — Progress Notes (Signed)
   11/18/21 0515  Sleep  Number of Hours 8.5

## 2021-11-19 DIAGNOSIS — F312 Bipolar disorder, current episode manic severe with psychotic features: Secondary | ICD-10-CM | POA: Diagnosis not present

## 2021-11-19 LAB — COMPREHENSIVE METABOLIC PANEL
ALT: 16 U/L (ref 0–44)
AST: 18 U/L (ref 15–41)
Albumin: 3.7 g/dL (ref 3.5–5.0)
Alkaline Phosphatase: 59 U/L (ref 38–126)
Anion gap: 8 (ref 5–15)
BUN: 15 mg/dL (ref 6–20)
CO2: 26 mmol/L (ref 22–32)
Calcium: 9.3 mg/dL (ref 8.9–10.3)
Chloride: 108 mmol/L (ref 98–111)
Creatinine, Ser: 1.08 mg/dL — ABNORMAL HIGH (ref 0.44–1.00)
GFR, Estimated: >60 mL/min (ref >60)
Glucose, Bld: 103 mg/dL — ABNORMAL HIGH (ref 70–99)
Potassium: 4.3 mmol/L (ref 3.5–5.1)
Sodium: 142 mmol/L (ref 135–145)
Total Bilirubin: 0.3 mg/dL (ref 0.3–1.2)
Total Protein: 7.8 g/dL (ref 6.5–8.1)

## 2021-11-19 NOTE — [facility] Counselor (Signed)
CSW spoke with Natalie Brown and explained that she has been signed in voluntarily and her IVC has been rescinded.  CSW further explained that her mother is guardian and that her mother has signed her in voluntarily until she has been able to find safe placement.  Patient is frustrated and continued to deny needing to be here.  Patient continues to state that she can stay with her sister and guardian and sister will bring her home.     Natalie Howk, LCSW, LCAS Clincal Social Worker  [facility]

## 2021-11-19 NOTE — Progress Notes (Signed)
[facility] LCSW Progress Note   11/19/2021    12:50 PM  Natalie Brown   [id number]   Type of Contact and Topic:  Consent for Voluntary Treatment   CSW has contacted [name], patient's legal guardian, to inform her of the physician's intent to rescind involuntary commitment paperwork. Legal guardian verbally consent to voluntary treatment at this time. Legal guardian was requested to present to facility in order to sign voluntary forms, to which she has agreed to do so.   Situation ongoing, CSW will continue to monitor and update note as more information becomes available. CSW to ensure voluntary form is completed.     Signed:  [name], MSW, LCSWA, LCAS 11/19/2021 12:50 PM

## 2021-11-19 NOTE — Progress Notes (Signed)
Pt was encouraged but didn't attend orientation/goals group. ?

## 2021-11-19 NOTE — Group Note (Signed)
LCSW Group Therapy Note   Group Date: 11/19/2021 Start Time: 1300 End Time: 1400   Type of Therapy and Topic:  Group Therapy: Challenging Core Beliefs  Participation Level:  Active  Description of Group:  Patients were educated about core beliefs and asked to identify one harmful core belief that they have. Patients were asked to explore from where those beliefs originate. Patients were asked to discuss how those beliefs make them feel and the resulting behaviors of those beliefs. They were then be asked if those beliefs are true and, if so, what evidence they have to support them. Lastly, group members were challenged to replace those negative core beliefs with helpful beliefs.   Therapeutic Goals:   1. Patient will identify harmful core beliefs and explore the origins of such beliefs. 2. Patient will identify feelings and behaviors that result from those core beliefs. 3. Patient will discuss whether such beliefs are true. 4.  Patient will replace harmful core beliefs with helpful ones.  Summary of Patient Progress:  Natalie Brown actively engaged in processing and exploring how core beliefs are formed and how they impact thoughts, feelings, and behaviors. Patient proved open to input from peers and feedback from CSW. Patient demonstrated poor insight into the subject matter, was respectful and supportive of peers, and participated throughout the entire session.  Therapeutic Modalities: Cognitive Behavioral Therapy; Solution-Focused Therapy   Natalie Brown Natalie Lacroix, LCSW 11/19/2021  2:12 PM

## 2021-11-19 NOTE — Plan of Care (Signed)
  Problem: Coping: Goal: Ability to verbalize frustrations and anger appropriately will improve Outcome: Progressing Goal: Ability to demonstrate self-control will improve Outcome: Progressing   Problem: Self-Concept: Goal: Ability to disclose and discuss suicidal ideas will improve Outcome: Progressing Goal: Will verbalize positive feelings about self Outcome: Progressing

## 2021-11-19 NOTE — [facility] Group Notes (Signed)
Adult Psychoeducational Group Note  Date:  11/19/2021 Time:  2:48 PM  Group Topic/Focus:  Wellness Toolbox:   The focus of this group is to discuss various aspects of wellness, balancing those aspects and exploring ways to increase the ability to experience wellness.  Patients will create a wellness toolbox for use upon discharge.  Participation Level:  Active  Participation Quality:  Appropriate  Affect:  Appropriate  Cognitive:  Appropriate  Insight: Appropriate  Engagement in Group:  Engaged  Modes of Intervention:  Activity  Additional Comments:  Patient attended and participated in the relaxation group activity.  [name] 11/19/2021, 2:48 PM

## 2021-11-19 NOTE — Group Note (Signed)
Recreation Therapy Group Note   Group Topic:Health and Wellness  Group Date: 11/19/2021 Start Time: 1000 End Time: 1030 Facilitators: Caroll Rancher, LRT,CTRS Location: [address] Dayroom   Goal Area(s) Addresses:  Patient will define components of whole wellness. Patient will verbalize benefit of whole wellness.  Group Description:  Exercise.  LRT and patients discussed the importance of having mental, physical and spiritual stability.  LRT and patients discussed how each area was important to making the individual feel whole.  LRT told the patients when those areas feel off, to take time for themselves to regroup by doing things such as going for a walk, watching a movie, listening to music, etc.  LRT and patients then proceeded to engage in exercise.  LRT led patients in a series of stretches to get loose.  Patients then took turns leading group in an exercise of their choosing.  The activity was to take at least 30 minutes.  Patients were encouraged to take breaks or get water as needed.   Affect/Mood: Appropriate   Participation Level: Active   Participation Quality: Independent   Behavior: Apprehensive    Speech/Thought Process: Disorganized   Insight: Lacking   Judgement: Lacking    Modes of Intervention: Music and Exercise   Patient Response to Interventions:  Receptive   Education Outcome:  Acknowledges education and In group clarification offered    Clinical Observations/Individualized Feedback: Pt came in for the last few minutes of group.  Pt needed encouraging to get involved in the activity.  Pt seemed hesitant and having a dazed look at times but would eventually participate in the activity.    Plan: Continue to engage patient in RT group sessions 2-3x/week.   Caroll Rancher, Antonietta Jewel 11/19/2021 12:23 PM

## 2021-11-19 NOTE — Progress Notes (Signed)
[facility] MD Progress Note  11/19/2021 2:38 PM Natalie Brown  MRN:  [id number]  Reason For Admission: Natalie Brown is a 56 yr old female who presented on 5/17 to [facility] with complaints of anxiety and feeling overwhelmed and SI without a plan, she was admitted to [facility] on 5/19.  PPHx is significant for possible Bipolar Disorder.  Two days ago, the psychiatry team made following recommendations: Continue Haldol 2 mg BID  Interval History: PRN Medications administered within the last 24 hours: colace, atarax x1, nicorette x 1 Per nursing staff: patient has been disheveled as well as pre-occupied and confused  Per Patient:  On assessment today, the patient appears similar to previous days.  The patient continues to appear delusional, stating that her boyfriend of 7 years visited yesterday.  She states "he has been paying my storage to my mom at [employer]".  She states that she needs to leave the hospital so that she can "run errands and go to court".  She reports a mood that is "okay".  She denies suicidal or homicidal thoughts.  She denies auditory or visual hallucinations.  Principal Problem: Bipolar affective disorder, current episode manic with psychotic symptoms ([place]) Diagnosis: Principal Problem:   Bipolar affective disorder, current episode manic with psychotic symptoms ([place]) Active Problems:   Major neurocognitive disorder ([place])  Past Psychiatric History: Probable bipolar disorder  Past Medical History:  Past Medical History:  Diagnosis Date   Bipolar 1 disorder with moderate mania ([place]) 12/30/2013   Hypertension    Family History: see H&P  Family Psychiatric  History: Patient reports mental illness runs in her father's side of the family  Social History:  Social History   Substance and Sexual Activity  Alcohol Use No     Social History   Substance and Sexual Activity  Drug Use No    Social History   Socioeconomic History   Marital status: Married    Spouse name: Not on  file   Number of children: Not on file   Years of education: Not on file   Highest education level: Not on file  Occupational History   Not on file  Tobacco Use   Smoking status: Every Day   Smokeless tobacco: Current  Substance and Sexual Activity   Alcohol use: No   Drug use: No   Sexual activity: Not on file  Other Topics Concern   Not on file  Social History Narrative   Not on file   Social Determinants of Health   Financial Resource Strain: Not on file  Food Insecurity: Not on file  Transportation Needs: Not on file  Physical Activity: Not on file  Stress: Not on file  Social Connections: Not on file   Additional Social History:   Sleep: Good  Appetite:  Good  Current Medications: Current Facility-Administered Medications  Medication Dose Route Frequency Provider Last Rate Last Admin   acetaminophen (TYLENOL) tablet 650 mg  650 mg Oral Q6H PRN Briant Cedar, MD   650 mg at 11/14/21 0759   alum & mag hydroxide-simeth (MAALOX/MYLANTA) 200-200-20 MG/5ML suspension 30 mL  30 mL Oral Q4H PRN Massengill, [name], MD       benztropine (COGENTIN) tablet 0.5 mg  0.5 mg Oral BID Armando Reichert, MD   0.5 mg at 11/19/21 0814   docusate sodium (COLACE) capsule 100 mg  100 mg Oral Daily PRN Corky Sox, MD   100 mg at 11/18/21 2117   feeding supplement (KATE FARMS STANDARD 1.4) liquid 325  mL  325 mL Oral BID BM Massengill, [name], MD   325 mL at 11/19/21 1301   haloperidol (HALDOL) tablet 2 mg  2 mg Oral BID Corky Sox, MD   2 mg at 11/19/21 5784   hydrocerin (EUCERIN) cream   Topical BID Janine Limbo, MD   Given at 11/19/21 6962   hydrOXYzine (ATARAX) tablet 25 mg  25 mg Oral TID PRN Janine Limbo, MD   25 mg at 11/18/21 2111   LORazepam (ATIVAN) tablet 1 mg  1 mg Oral Q6H PRN Harlow Asa, MD   1 mg at 10/07/21 1546   magnesium hydroxide (MILK OF MAGNESIA) suspension 30 mL  30 mL Oral Daily PRN Janine Limbo, MD   30 mL at 11/14/21 0802    nicotine polacrilex (NICORETTE) gum 2 mg  2 mg Oral PRN Dian Situ, MD   2 mg at 11/19/21 1304   OLANZapine zydis (ZYPREXA) disintegrating tablet 5 mg  5 mg Oral Q8H PRN Harlow Asa, MD   5 mg at 10/14/21 1416   And   ziprasidone (GEODON) injection 20 mg  20 mg Intramuscular PRN Harlow Asa, MD       Lab Results:  Results for orders placed or performed during the hospital encounter of 09/06/21 (from the past 48 hour(s))  Comprehensive metabolic panel     Status: Abnormal   Collection Time: 11/19/21  6:31 AM  Result Value Ref Range   Sodium 142 135 - 145 mmol/L   Potassium 4.3 3.5 - 5.1 mmol/L   Chloride 108 98 - 111 mmol/L   CO2 26 22 - 32 mmol/L   Glucose, Bld 103 (H) 70 - 99 mg/dL    Comment: Glucose reference range applies only to samples taken after fasting for at least 8 hours.   BUN 15 6 - 20 mg/dL   Creatinine, Ser 1.08 (H) 0.44 - 1.00 mg/dL   Calcium 9.3 8.9 - 10.3 mg/dL   Total Protein 7.8 6.5 - 8.1 g/dL   Albumin 3.7 3.5 - 5.0 g/dL   AST 18 15 - 41 U/L   ALT 16 0 - 44 U/L   Alkaline Phosphatase 59 38 - 126 U/L   Total Bilirubin 0.3 0.3 - 1.2 mg/dL   GFR, Estimated >60 >60 mL/min    Comment: (NOTE) Calculated using the CKD-EPI Creatinine Equation (2021)    Anion gap 8 5 - 15    Comment: Performed at [facility], [place] [address], [place], [place] [address]      Blood Alcohol level:  Lab Results  Component Value Date   ETH <10 13/24/4010   Metabolic Disorder Labs: Lab Results  Component Value Date   HGBA1C 5.3 09/08/2021   MPG 105.41 09/08/2021   No results found for: "PROLACTIN" Lab Results  Component Value Date   CHOL 173 09/08/2021   TRIG 110 09/08/2021   HDL 50 09/08/2021   CHOLHDL 3.5 09/08/2021   VLDL 22 09/08/2021   LDLCALC 101 (H) 09/08/2021   LDLCALC 102 (H) 06/21/2020   Physical Findings:  AIMS: Facial and Oral Movements Muscles of Facial Expression: None, normal Lips and Perioral Area: None, normal Jaw:  None, normal Tongue: None, normal,Extremity Movements Upper (arms, wrists, hands, fingers): None, normal Lower (legs, knees, ankles, toes): None, normal, Trunk Movements Neck, shoulders, hips: None, normal, Overall Severity Severity of abnormal movements (highest score from questions above): None, normal Incapacitation due to abnormal movements: None, normal Patient's awareness of abnormal movements (rate only  patient's report): No Awareness, Dental Status Current problems with teeth and/or dentures?: No Does patient usually wear dentures?: No  No Cogwheeling or Rigidity Present.  Musculoskeletal: Strength & Muscle Tone: within normal limits Gait & Station: normal Patient leans: N/A  Psychiatric Specialty Exam:  Presentation  General Appearance: unkempt appearing - wearing hair bonnet and multiple layers of clothing  Eye Contact:Good  Speech:rambling but normal fluency  Speech Volume:Normal  Mood and Affect  Mood: euthymic  Affect: constricted, odd  Thought Process  Thought Processes: perseverative and ruminative; concrete  Orientation:Partial  Thought Content: Continues to have baseline, fixed delusions but does not mention these unless prompted with questions; is not grossly responding to internal/external stimuli on exam; denies SI, HI, AVH or paranoia and denies ideas of reference or first rank symptoms but is guarded and perseverative about need for continued admission   Hallucinations:Denied  Ideas of Reference:Denied  Suicidal Thoughts:Denied   Homicidal Thoughts:Denied   Sensorium  Memory:Limited   Judgment:Impaired  Insight:Poor  Executive Functions  Concentration:Poor  Attention Span:Poor  [place] of Knowledge:Limited  Language:Fair  Psychomotor Activity  Psychomotor Activity:No cogwheeling, no stiffness, no tremor, AIMS 0 (7/20)   Assets  Assets:Social Support  Sleep  Fair   Physical Exam Constitutional:       Appearance: the patient is not toxic-appearing.  Pulmonary:     Effort: Pulmonary effort is normal.  Neurological:     General: No focal deficit present.   Review of Systems  Respiratory:  Negative for shortness of breath.   Cardiovascular:  Negative for chest pain.  Gastrointestinal:  Negative for abdominal pain, constipation, diarrhea, nausea and vomiting.  Neurological:  Negative for headaches.   Blood pressure 120/83, pulse 100, temperature 98.1 F (36.7 C), temperature source Oral, resp. rate 16, height 5' 6"  (2.542 m), weight 72.6 kg, SpO2 100 %. Body mass index is 25.83 kg/m.  Treatment Plan Summary: Daily contact with patient to assess and evaluate symptoms and progress in treatment and Medication management  Safety and Monitoring: Voluntary admission to inpatient psychiatric unit for safety, stabilization and treatment (patient's guardian signed patient in voluntarily on 7/31) Daily contact with patient to assess and evaluate symptoms and progress in treatment Patient's case to be discussed in multi-disciplinary team meeting Observation Level : q15 minute checks Vital signs: q12 hours Precautions: safety   Bipolar I MRE manic with psychotic features (r/o schizoaffective d/o - bipolar type) Major neurocognitive disorder with behavioral disturbance (marked for age brain atrophy on CT head and MOCA 11/30) At this time, the patient's mania has resolved with medications and she appears to have residual, fixed delusions. It is unclear if she has had a bipolar or schizoaffective d/o presentation prior to this hospitalization in the past, but overall the mania she had on admission has resolved and her mood stability and behavioral control are improved at this time on medications. Given her brain atrophy and cognitive testing scores, she appears to have a major neurocognitive d/o of unclear etiology also present.   -Continue Haldol 2 mg AM and 2 mg QHS (decrease gradually) given  patient's prolonged stability and goal to reduce antipsychotic polypharmacy after discussion with guardian (Discussed with guardian the black box warning for use of antipsychotics in patient's with dementia and guardian ideally would like the team to transition to monotherapy if possible - advised if her mania returns or she has decompensation with dose reduction in Haldol that we would have to readdress medication regimen with guardian at that time) -  Continue Cogentin 0.5 mg twice daily - may be able to reduce medication need as Haldol is discontinued  -Invega Sustenna 234 mg IM given on 09-30-21, 167m IM given 10/12/20 - Invega 78 mg given on 7/21, reduced dose given due to CrCl of 54 after consultation with pharmacist regarding monthly maintenance dose and consent obtained from guardian for medication administration - AIMS on 7/20 of 0  -- Zyprexa/Geodon/ativan agitation protocol PRN  -Additional psychosis labs (HIV- , RPR-, ANA-, heavy metal WNL, ESR wnl, ceruloplasmin wnl and CT head shows Marked for age brain atrophy.  No acute or reversible finding.)   -Depakote, Ativan, and Zyprexa stopped - ACTT referral pending - Recommend Neurology f/u after discharge for dementia evaluation/management - Interim guardianship in place with mother and waiting on SSDI and medicaid for group home referral    Hyperammonemia -resolved -Ammonia level 30 on 10/27/21 -Depakote was stopped    Mildly elevated creatinine  -Creatinine of 1.22 on 7/18    Season allergies -Continue Claritin 140mdaily PRN and d/c flonase at request of guardian    Urinary frequency- resolved -repeat UA with rare bacteria, but other wise, WNL  Bedwetting episode (7/26) -May be related to progressing dementia, low suspicion for UTI given lack of urinary symptoms, will get UA if this recurs  Discharge Planning: Social work and case management to assist with discharge planning and identification of hospital follow-up needs prior to  discharge Discharge Concerns: Need to establish a safety plan; Medication compliance and effectiveness Discharge Goals: Return home with outpatient referrals for mental health follow-up including medication management/psychotherapy LCSW NaWinferd Humphreyo meet with patient regarding disability  Labs On Admission- CMP: WNL except Cr: 1.1,  GFR: 60,  CBC: WNL,  UDS: Neg,  Troponin: 3,  EtOH: Neg,  beta hCG: 6.4,  Resp Panel: Neg 5/29- Urine Preg: Neg,  A1c: 5.3,  TSH: 1.719,  Lipid Panel: WNL except LDL: 101 5/24- BMP: WNL except Cr: 1.01,  Ca: 8.4,  hCG: 5,  Urine Preg: Neg 5/28- Ammonia: 32,  CBC: WNL,  CMP: WNL except  Cr: 1.08,  Ca: 8.6,  Depakote lvl: 20 6/3- Depakote lvl: 64, Hep Function: WNL,  CBC: WNL except Hem: 11.9 6/7- Depakote lvl: 75 6/18- UA: Neg,  CBC: WNL,  Depakote lvl: 136,  Ammonia: 99,  BMP: WNL except BUN: 22,  Cr: 1.19,  Ca: 8.6,  GFR: 54,  Hep Function: WNL except Albu: 3.3,  Indirect Bili: 0.2 6/20- ANA: Neg,  Ammonia: 48,  Ceruloplasmin: 24.2,  CMP: WNL except BUN: 23,  Cr: 1.35,  Ca: 8.3,  Albu: 3.4  HIV:Neg,  RPR: Neg,  Sed Rate: 6,  Depakote lvl: 25,  Heavy Metal: Neg 6/23- Ammonia: 46,  BMP: WNL except Cr: 1.2,  Ca: 8.3,  GFR: 54 6/24- UA: WNL except Bacteria: rare 6/30- CMP:  WNL except Cr: 1.04,  Ca: 8.6,  CBC: WNL,  Ammonia: 41, EKG: NSR with Qtc: 399 7/8 - CMP WNL other than CR 1.33 and GFR 48; Ammonia 30 7/18 - Cr of 1.22, eGFR of 53 7/23 - EKG with Qtc of 42[place]wickMD PGY-2

## 2021-11-19 NOTE — Progress Notes (Signed)
     11/19/21 0544  Sleep  Number of Hours 6.75

## 2021-11-19 NOTE — Progress Notes (Signed)
    11/18/21 2111  Psych Admission Type (Psych Patients Only)  Admission Status Involuntary  Psychosocial Assessment  Patient Complaints Other (Comment) (Constipation)  Eye Contact Fair  Facial Expression Anxious  Affect Appropriate to circumstance  Speech Soft  Interaction Cautious  Motor Activity Slow  Appearance/Hygiene Disheveled  Behavior Characteristics Cooperative  Mood Pleasant;Preoccupied  Thought Process  Coherency Disorganized;Circumstantial  Content Preoccupation  Delusions None reported or observed  Perception WDL  Hallucination None reported or observed  Judgment Poor  Confusion Mild  Danger to Self  Current suicidal ideation? Denies  Agreement Not to Harm Self Yes  Description of Agreement verbal contract for safety  Danger to Others  Danger to Others None reported or observed

## 2021-11-19 NOTE — [facility] Group Notes (Signed)
Adult Psychoeducational Group Note  Date:  11/19/2021 Time:  8:35 PM  Group Topic/Focus:  Wrap-Up Group:   The focus of this group is to help patients review their daily goal of treatment and discuss progress on daily workbooks.  Participation Level:  Active  Participation Quality:  Drowsy  Affect:  Flat  Cognitive:  Disorganized  Insight: Lacking  Engagement in Group:  Distracting  Modes of Intervention:  Discussion  Additional Comments:  Patient attended and participated in the Wrap-up.  [name] 11/19/2021, 8:35 PM

## 2021-11-19 NOTE — Plan of Care (Signed)
  Problem: Education: Goal: Emotional status will improve Outcome: Progressing   Problem: Coping: Goal: Ability to verbalize frustrations and anger appropriately will improve Outcome: Progressing Goal: Ability to demonstrate self-control will improve Outcome: Progressing   Problem: Health Behavior/Discharge Planning: Goal: Compliance with treatment plan for underlying cause of condition will improve Outcome: Progressing

## 2021-11-20 DIAGNOSIS — F312 Bipolar disorder, current episode manic severe with psychotic features: Secondary | ICD-10-CM | POA: Diagnosis not present

## 2021-11-20 LAB — BASIC METABOLIC PANEL
Anion gap: 8 (ref 5–15)
BUN: 16 mg/dL (ref 6–20)
CO2: 24 mmol/L (ref 22–32)
Calcium: 8.6 mg/dL — ABNORMAL LOW (ref 8.9–10.3)
Chloride: 106 mmol/L (ref 98–111)
Creatinine, Ser: 1.24 mg/dL — ABNORMAL HIGH (ref 0.44–1.00)
GFR, Estimated: 52 mL/min — ABNORMAL LOW (ref >60)
Glucose, Bld: 128 mg/dL — ABNORMAL HIGH (ref 70–99)
Potassium: 3.8 mmol/L (ref 3.5–5.1)
Sodium: 138 mmol/L (ref 135–145)

## 2021-11-20 MED ORDER — NICOTINE 14 MG/24HR TD PT24
14.0000 mg | MEDICATED_PATCH | Freq: Every day | TRANSDERMAL | Status: DC
  Administered 2021-11-21 – 2021-12-24 (×33): 14 mg via TRANSDERMAL
  Filled 2021-11-20 (×36): qty 1

## 2021-11-20 MED ORDER — BENZTROPINE MESYLATE 0.5 MG PO TABS
0.5000 mg | ORAL_TABLET | Freq: Every day | ORAL | Status: DC
  Administered 2021-11-21 – 2021-11-23 (×3): 0.5 mg via ORAL
  Filled 2021-11-20 (×5): qty 1

## 2021-11-20 MED ORDER — HALOPERIDOL 1 MG PO TABS
3.0000 mg | ORAL_TABLET | Freq: Every day | ORAL | Status: DC
  Administered 2021-11-21: 3 mg via ORAL
  Filled 2021-11-20 (×3): qty 3

## 2021-11-20 MED ORDER — HALOPERIDOL 2 MG PO TABS
2.0000 mg | ORAL_TABLET | Freq: Two times a day (BID) | ORAL | Status: AC
  Administered 2021-11-20: 2 mg via ORAL
  Filled 2021-11-20 (×2): qty 1

## 2021-11-20 NOTE — Group Note (Signed)
Recreation Therapy Group Note   Group Topic:Coping Skills  Group Date: 11/20/2021 Start Time: 1000 End Time: 1050 Facilitators: Caroll Rancher, LRT,CTRS Location: [address] Dayroom   Goal Area(s) Addresses:  Patient will successfully define what a coping skill is. Patient will create a visual display of at least 5 positive coping skills. Patient will successfully identify benefit of using outlined coping skills post d/c.     Group Description:  Patients were asked to fill in a coping skills chart using images and words found from magazines. As a group, patients were prompted to discuss what coping skills are, when they need to be utilized, and the importance of selection based on various triggers. Coping skills on the chart were identified by 5 categories - Diversion, Social, Cognitive, Tension Releasers, and Physical. LRT requested that patients represent at least 2 coping skills per category. Patients were then asked to present their collage-style artwork to the group and make suggestions to peers, when necessary, who might be have less skills in certain sections of the chart.   Affect/Mood: Flat   Participation Level: None   Participation Quality: None   Behavior: Confused   Speech/Thought Process: Disorganized and Irrational   Insight: Impaired   Judgement: Impaired   Modes of Intervention: Art   Patient Response to Interventions:  Resistant  and Skeptical    Education Outcome:  Acknowledges education and In group clarification offered    Clinical Observations/Individualized Feedback: Pt arrived late to group.  LRT attempted to explain instructions to pt but pt couldn't focus and was concerned with other things.  Pt kept asking "what is this" and saying "this makes no sense".  Pt left and stayed gone the majority of group but returned as group was wrapping up.    Plan: Continue to engage patient in RT group sessions 2-3x/week.   Caroll Rancher, LRT,CTRS  11/20/2021  12:01 PM

## 2021-11-20 NOTE — Progress Notes (Addendum)
[facility] LCSW Progress Note   11/20/2021    10:19 AM  Nobie Putnam   [id number]   Type of Contact and Topic:  Care Coordination    CSW attempted to contact Onnie Boer, [phone]) disability assistance coordinator at the [facility]. Unable to reach him, Left HIPAA compliant voicemail with contact information and callback request. CSW will make additional attempts to reach him in order to have necessary paperwork signed for disability application.     Signed:  [name], MSW, LCSWA, LCAS 11/20/2021 10:19 AM

## 2021-11-20 NOTE — [facility] Group Notes (Signed)
Pt. Attended group. Pt. Stated that she will like to be discharge.

## 2021-11-20 NOTE — Progress Notes (Signed)
Adult Psychoeducational Group Note  Date:  11/20/2021 Time:  9:09 PM  Group Topic/Focus:  Wrap-Up Group:   The focus of this group is to help patients review their daily goal of treatment and discuss progress on daily workbooks.  Participation Level:  Did Not Attend  Participation Quality:   Did Not Attend  Affect:   Did Not Attend  Cognitive:   Did Not Attend  Insight: None  Engagement in Group:   Did Not Attend  Modes of Intervention:   Did Not Attend  Additional Comments:  Pt was encouraged to attend wrap up group but did not attend.  Felipa Furnace 11/20/2021, 9:09 PM

## 2021-11-20 NOTE — Progress Notes (Signed)
[facility] MD Progress Note  11/20/2021 8:28 AM Natalie Brown  MRN:  [id number]  Reason for Admission:  Natalie Brown is a 56 y.o. female  who was initially admitted for inpatient psychiatric hospitalization on 09/06/2021 for management of mania, disorganized behaviors/thinking, and delusions. The patient is currently on Hospital Day 75.   Chart Review from last 24 hours:  The patient's chart was reviewed and nursing notes were reviewed. The patient's case was discussed in multidisciplinary team meeting. Per nursing, she remains disorganized but has had no acute behavioral issues. She attended groups. Per MAR she was compliant with scheduled medications and did receive Vistaril X1 for anxiety, Colace X1 for constipation, and nicorette gum.   Information Obtained Today During Patient Interview: The patient was seen and evaluated on the unit. On assessment today the patient reports that she is sleeping and eating well. She states she has a "secret" she is keeping from her mother and that is that she plans to stay with her sister once she is discharged. She remains perseverative about discharge planning. She continues to have fixed delusions that she speaks multiple languages, has 6 advanced degrees, and that she is working on her [employer]. She denies SI, HI, AVH, paranoia, ideas of reference or first rank symptoms. She derails into discussion that she thinks her boyfriend and mother are paying the rent on her storage building. She states her mood is "pretty good" and voices no other physical complaints. She states she had a BM yesterday. She requests to change to Nicotine patch from gum. She states she showered today but appear to have on dirty clothing and was prompted to wash her clothes.  Principal Problem: Bipolar affective disorder, current episode manic with psychotic symptoms ([place]) Diagnosis: Principal Problem:   Bipolar affective disorder, current episode manic with psychotic symptoms  ([place]) Active Problems:   Major neurocognitive disorder ([place])  Past Psychiatric History: see H&P  Past Medical History:  Past Medical History:  Diagnosis Date   Bipolar 1 disorder with moderate mania ([place]) 12/30/2013   Hypertension    Family History: see H&P  Family Psychiatric  History: see H&P  Social History:  Homeless; mother is new guardian   Sleep: Good  Appetite:  Good  Current Medications: Current Facility-Administered Medications  Medication Dose Route Frequency Provider Last Rate Last Admin   acetaminophen (TYLENOL) tablet 650 mg  650 mg Oral Q6H PRN Briant Cedar, MD   650 mg at 11/14/21 0759   alum & mag hydroxide-simeth (MAALOX/MYLANTA) 200-200-20 MG/5ML suspension 30 mL  30 mL Oral Q4H PRN Massengill, [name], MD       benztropine (COGENTIN) tablet 0.5 mg  0.5 mg Oral BID Rosita Kea, Vandana, MD   0.5 mg at 11/20/21 8592   docusate sodium (COLACE) capsule 100 mg  100 mg Oral Daily PRN Corky Sox, MD   100 mg at 11/20/21 9244   feeding supplement (KATE FARMS STANDARD 1.4) liquid 325 mL  325 mL Oral BID BM Massengill, [name], MD   325 mL at 11/20/21 6286   haloperidol (HALDOL) tablet 2 mg  2 mg Oral BID Corky Sox, MD   2 mg at 11/20/21 3817   hydrocerin (EUCERIN) cream   Topical BID Janine Limbo, MD   Given at 11/20/21 7116   hydrOXYzine (ATARAX) tablet 25 mg  25 mg Oral TID PRN Janine Limbo, MD   25 mg at 11/19/21 2051   LORazepam (ATIVAN) tablet 1 mg  1 mg Oral Q6H PRN Nelda [place],  Lamorris Knoblock E, MD   1 mg at 10/07/21 1546   magnesium hydroxide (MILK OF MAGNESIA) suspension 30 mL  30 mL Oral Daily PRN Janine Limbo, MD   30 mL at 11/14/21 0802   nicotine polacrilex (NICORETTE) gum 2 mg  2 mg Oral PRN Dian Situ, MD   2 mg at 11/19/21 2051   OLANZapine zydis (ZYPREXA) disintegrating tablet 5 mg  5 mg Oral Q8H PRN Harlow Asa, MD   5 mg at 10/14/21 1416   And   ziprasidone (GEODON) injection 20 mg  20 mg Intramuscular PRN Harlow Asa,  MD       Lab Results:  Results for orders placed or performed during the hospital encounter of 09/06/21 (from the past 48 hour(s))  Comprehensive metabolic panel     Status: Abnormal   Collection Time: 11/19/21  6:31 AM  Result Value Ref Range   Sodium 142 135 - 145 mmol/L   Potassium 4.3 3.5 - 5.1 mmol/L   Chloride 108 98 - 111 mmol/L   CO2 26 22 - 32 mmol/L   Glucose, Bld 103 (H) 70 - 99 mg/dL    Comment: Glucose reference range applies only to samples taken after fasting for at least 8 hours.   BUN 15 6 - 20 mg/dL   Creatinine, Ser 1.08 (H) 0.44 - 1.00 mg/dL   Calcium 9.3 8.9 - 10.3 mg/dL   Total Protein 7.8 6.5 - 8.1 g/dL   Albumin 3.7 3.5 - 5.0 g/dL   AST 18 15 - 41 U/L   ALT 16 0 - 44 U/L   Alkaline Phosphatase 59 38 - 126 U/L   Total Bilirubin 0.3 0.3 - 1.2 mg/dL   GFR, Estimated >60 >60 mL/min    Comment: (NOTE) Calculated using the CKD-EPI Creatinine Equation (2021)    Anion gap 8 5 - 15    Comment: Performed at [facility], [place] [address] [place], [place] [address]      Blood Alcohol level:  Lab Results  Component Value Date   ETH <10 08/65/7846   Metabolic Disorder Labs: Lab Results  Component Value Date   HGBA1C 5.3 09/08/2021   MPG 105.41 09/08/2021   No results found for: "PROLACTIN" Lab Results  Component Value Date   CHOL 173 09/08/2021   TRIG 110 09/08/2021   HDL 50 09/08/2021   CHOLHDL 3.5 09/08/2021   VLDL 22 09/08/2021   LDLCALC 101 (H) 09/08/2021   LDLCALC 102 (H) 06/21/2020   Physical Findings:  AIMS: Facial and Oral Movements Muscles of Facial Expression: None, normal Lips and Perioral Area: None, normal Jaw: None, normal Tongue: None, normal,Extremity Movements Upper (arms, wrists, hands, fingers): None, normal Lower (legs, knees, ankles, toes): None, normal, Trunk Movements Neck, shoulders, hips: None, normal, Overall Severity Severity of abnormal movements (highest score from questions above): None,  normal Incapacitation due to abnormal movements: None, normal Patient's awareness of abnormal movements (rate only patient's report): No Awareness, Dental Status Current problems with teeth and/or dentures?: No Does patient usually wear dentures?: No   Musculoskeletal: Strength & Muscle Tone: within normal limits Gait & Station: normal Patient leans: N/A  Psychiatric Specialty Exam:  Presentation  General Appearance: unkempt appearing - wearing hair bonnet and dirty scrub top with yoga pants  Eye Contact:Fair  Speech:rambling but normal fluency  Speech Volume:Normal  Mood and Affect  Mood:described as "pretty good" - appears calm  Affect: calm, polite, mildly constricted  Thought Process  Thought Processes: perseverative  and disorganized; concrete  Orientation:Oriented to month but not year; oriented to city and state but not situation  Thought Content: Continues to have baseline, fixed delusions but does not mention these unless prompted with questions; is not grossly responding to internal/external stimuli on exam; denies SI, HI, AVH or paranoia and denies ideas of reference or first rank symptoms but is guarded and perseverative about need for continued admission   Hallucinations:Denied  Ideas of Reference:Denied  Suicidal Thoughts:Denied   Homicidal Thoughts:Denied   Sensorium  Memory:Limited   Judgment:Impaired  Insight:Poor  Executive Functions  Concentration:Poor  Attention Span:Poor  [name] of Knowledge:Limited  Language:Fair  Psychomotor Activity  Psychomotor Activity:goes to bathroom during middle of interview and then rustles in her papers to show examiner her IVC paperwork - no akathisias noted, no tremors   Assets  Assets:Social Support  Sleep  8.75 hours  Physical Exam Vitals and nursing note reviewed.  Constitutional:      Appearance: Normal appearance.  HENT:     Head: Normocephalic.  Pulmonary:     Effort:  Pulmonary effort is normal.  Neurological:     General: No focal deficit present.     Mental Status: [name] alert.    Review of Systems  Respiratory:  Negative for shortness of breath.   Cardiovascular:  Negative for chest pain.  Gastrointestinal:  Negative for abdominal pain, constipation, diarrhea, nausea and vomiting.  Neurological:  Negative for headaches.   Blood pressure 120/79, pulse (!) 116, temperature 98.2 F (36.8 C), temperature source Oral, resp. rate 16, height 5' 6"  (1.676 m), weight 72.6 kg, SpO2 95 %. Body mass index is 25.83 kg/m.  Treatment Plan Summary: ASSESSMENT: At this time, the patient's mania has resolved with medications and she appears to have residual, fixed delusions. It is unclear if she has had a bipolar or schizoaffective d/o presentation prior to this hospitalization in the past, but overall the mania she had on admission has resolved and her mood stability and behavioral control are improved at this time on medications. Given her brain atrophy and cognitive testing scores, she appears to have a major neurocognitive d/o of unclear etiology also present.   Diagnoses / Active Problems: Bipolar I MRE manic with psychotic features Major Neurocognitive d/o with behavioral disturbance  PLAN: Safety and Monitoring:  -- Voluntary admission (signed in by guardian for treatment on 11/19/21) to inpatient psychiatric unit for safety, stabilization and treatment  -- Daily contact with patient to assess and evaluate symptoms and progress in treatment  -- Patient's case to be discussed in multi-disciplinary team meeting  -- Observation Level : q15 minute checks  -- Vital signs:  q12 hours  -- Precautions: suicide, elopement, and assault  2. Psychiatric Diagnoses and Treatment:   Bipolar I MRE manic with psychotic features (r/o schizoaffective d/o - bipolar type) Major neurocognitive disorder with behavioral disturbance (marked for age brain atrophy on CT head and  MOCA 11/30)   -Continue Haldol 2 mg AM and 2 mg QHS tonight and reduce to 37m po qhs starting tomorrow - goal to gradually taper off to monotherapy with Invega sustenna -Reduce Cogentin to 0.5 mg daily with taper down on Haldol   -Invega Sustenna 234 mg IM given on 09-30-21, 1550mIM given 10/12/20 - Invega 78 mg given on 7/21, reduced dose given due to CrCl of 54 after consultation with pharmacist regarding monthly maintenance dose and consent obtained from guardian for medication administration -- Zyprexa/Geodon/ativan agitation protocol PRN   --Additional  psychosis labs (HIV- , RPR-, ANA-, heavy metal WNL, ESR wnl, ceruloplasmin wnl and CT head shows Marked for age brain atrophy.  No acute or reversible finding.)   --Depakote, Ativan, and Zyprexa stopped -- ACTT referral pending -- Recommend Neurology f/u after discharge for dementia evaluation/management -- Interim guardianship in place with mother and waiting on SSDI and medicaid for group home referral  3. Medical Issues Being Addressed:   Tobacco Use Disorder  -- Nicotine patch 62m/24 hours ordered and d/c nicotine gum    Hyperammonemia -resolved --Ammonia level 30 on 10/27/21 --Depakote was stopped    Mildly elevated creatinine  --Creatinine of 1.22 on 7/18 (will attempt to recheck tonight if patient will comply with lab)    Seasonal allergies --Continue Claritin 140mdaily PRN and d/c flonase at request of guardian    4. Discharge Planning:   -- Social work and case management to assist with discharge planning and identification of hospital follow-up needs prior to discharge  -- Estimated LOS: 5-7 days  -- Discharge Concerns: Need to establish a safety plan; Medication compliance and effectiveness  -- Discharge Goals: Return home with outpatient referrals for mental health follow-up including medication management/psychotherapy  Labs On Admission- CMP: WNL except Cr: 1.1,  GFR: 60,  CBC: WNL,  UDS: Neg,  Troponin: 3,  EtOH:  Neg,  beta hCG: 6.4,  Resp Panel: Neg 5/29- Urine Preg: Neg,  A1c: 5.3,  TSH: 1.719,  Lipid Panel: WNL except LDL: 101 5/24- BMP: WNL except Cr: 1.01,  Ca: 8.4,  hCG: 5,  Urine Preg: Neg 5/28- Ammonia: 32,  CBC: WNL,  CMP: WNL except  Cr: 1.08,  Ca: 8.6,  Depakote lvl: 20 6/3- Depakote lvl: 64, Hep Function: WNL,  CBC: WNL except Hem: 11.9 6/7- Depakote lvl: 75 6/18- UA: Neg,  CBC: WNL,  Depakote lvl: 136,  Ammonia: 99,  BMP: WNL except BUN: 22,  Cr: 1.19,  Ca: 8.6,  GFR: 54,  Hep Function: WNL except Albu: 3.3,  Indirect Bili: 0.2 6/20- ANA: Neg,  Ammonia: 48,  Ceruloplasmin: 24.2,  CMP: WNL except BUN: 23,  Cr: 1.35,  Ca: 8.3,  Albu: 3.4  HIV:Neg,  RPR: Neg,  Sed Rate: 6,  Depakote lvl: 25,  Heavy Metal: Neg 6/23- Ammonia: 46,  BMP: WNL except Cr: 1.2,  Ca: 8.3,  GFR: 54 6/24- UA: WNL except Bacteria: rare 6/30- CMP:  WNL except Cr: 1.04,  Ca: 8.6,  CBC: WNL,  Ammonia: 41, EKG: NSR with Qtc: 399 7/8 - CMP WNL other than CR 1.33 and GFR 48; Ammonia 30 7/18 - Cr of 1.22, eGFR of 53 7/23 - EKG with Qtc of 42Fl[place]woodMD, FAPA 11/20/2021, 8:27 AM

## 2021-11-20 NOTE — Plan of Care (Signed)
  Problem: Activity: Goal: Sleeping patterns will improve Outcome: Progressing   Problem: Coping: Goal: Ability to verbalize frustrations and anger appropriately will improve Outcome: Progressing Goal: Ability to demonstrate self-control will improve Outcome: Progressing   

## 2021-11-20 NOTE — Progress Notes (Signed)
     11/19/21 2050  Psych Admission Type (Psych Patients Only)  Admission Status Involuntary  Psychosocial Assessment  Patient Complaints Suspiciousness  Eye Contact Fair  Facial Expression Anxious  Affect Appropriate to circumstance  Speech Soft  Interaction Cautious  Motor Activity Slow  Appearance/Hygiene Disheveled  Behavior Characteristics Cooperative  Mood Pleasant;Preoccupied  Thought Process  Coherency Disorganized  Content Preoccupation  Delusions None reported or observed  Perception WDL  Hallucination None reported or observed  Judgment Poor  Confusion Mild  Danger to Self  Current suicidal ideation? Denies  Agreement Not to Harm Self Yes  Description of Agreement verbal contract for safety  Danger to Others  Danger to Others None reported or observed

## 2021-11-20 NOTE — Progress Notes (Signed)
    11/20/21 0533  Sleep  Number of Hours 8.75

## 2021-11-21 ENCOUNTER — Encounter (HOSPITAL_COMMUNITY): Payer: Self-pay

## 2021-11-21 DIAGNOSIS — F312 Bipolar disorder, current episode manic severe with psychotic features: Secondary | ICD-10-CM | POA: Diagnosis not present

## 2021-11-21 MED ORDER — DONEPEZIL HCL 5 MG PO TABS
5.0000 mg | ORAL_TABLET | Freq: Every day | ORAL | Status: DC
  Administered 2021-11-21 – 2021-11-22 (×2): 5 mg via ORAL
  Filled 2021-11-21 (×4): qty 1

## 2021-11-21 MED ORDER — POLYETHYLENE GLYCOL 3350 17 G PO PACK: 17.0000 g | PACK | Freq: Every day | ORAL | Status: DC | PRN

## 2021-11-21 MED ORDER — DOCUSATE SODIUM 100 MG PO CAPS
100.0000 mg | ORAL_CAPSULE | Freq: Two times a day (BID) | ORAL | Status: DC
  Administered 2021-11-21 – 2021-12-05 (×27): 100 mg via ORAL
  Filled 2021-11-21 (×32): qty 1

## 2021-11-21 NOTE — BH IP Treatment Plan (Signed)
Interdisciplinary Treatment and Diagnostic Plan Update  11/21/2021 Time of Session: 0830 Natalie Brown MRN: [id number]  Principal Diagnosis: Bipolar affective disorder, current episode manic with psychotic symptoms (HCC)  Secondary Diagnoses: Principal Problem:   Bipolar affective disorder, current episode manic with psychotic symptoms (HCC) Active Problems:   Major neurocognitive disorder (HCC)   Current Medications:  Current Facility-Administered Medications  Medication Dose Route Frequency Provider Last Rate Last Admin   acetaminophen (TYLENOL) tablet 650 mg  650 mg Oral Q6H PRN Lauro Franklin, MD   650 mg at 11/14/21 0759   alum & mag hydroxide-simeth (MAALOX/MYLANTA) 200-200-20 MG/5ML suspension 30 mL  30 mL Oral Q4H PRN Massengill, Harrold Donath, MD       benztropine (COGENTIN) tablet 0.5 mg  0.5 mg Oral Daily Mason Jim, Amy E, MD   0.5 mg at 11/21/21 0913   docusate sodium (COLACE) capsule 100 mg  100 mg Oral BID Mason Jim, Amy E, MD       donepezil (ARICEPT) tablet 5 mg  5 mg Oral Daily Mason Jim, Amy E, MD       feeding supplement (KATE FARMS STANDARD 1.4) liquid 325 mL  325 mL Oral BID BM Massengill, Harrold Donath, MD   325 mL at 11/21/21 0913   haloperidol (HALDOL) tablet 3 mg  3 mg Oral QHS Comer Locket, MD       hydrocerin (EUCERIN) cream   Topical BID Phineas Inches, MD   Given at 11/21/21 0913   hydrOXYzine (ATARAX) tablet 25 mg  25 mg Oral TID PRN Phineas Inches, MD   25 mg at 11/20/21 2058   LORazepam (ATIVAN) tablet 1 mg  1 mg Oral Q6H PRN Comer Locket, MD   1 mg at 10/07/21 1546   magnesium hydroxide (MILK OF MAGNESIA) suspension 30 mL  30 mL Oral Daily PRN Massengill, Harrold Donath, MD   30 mL at 11/14/21 0802   nicotine (NICODERM CQ - dosed in mg/24 hours) patch 14 mg  14 mg Transdermal Daily Mason Jim, Amy E, MD   14 mg at 11/21/21 0914   OLANZapine zydis (ZYPREXA) disintegrating tablet 5 mg  5 mg Oral Q8H PRN Comer Locket, MD   5 mg at 10/14/21 1416   And    ziprasidone (GEODON) injection 20 mg  20 mg Intramuscular PRN Comer Locket, MD       polyethylene glycol (MIRALAX / GLYCOLAX) packet 17 g  17 g Oral Daily PRN Comer Locket, MD       PTA Medications: Medications Prior to Admission  Medication Sig Dispense Refill Last Dose   CLARITIN 10 MG tablet Take 10 mg by mouth daily.      fluticasone (FLONASE) 50 MCG/ACT nasal spray Place 1 spray into both nostrils daily.      pseudoephedrine-guaifenesin (MUCINEX D) 60-600 MG 12 hr tablet Take 1 tablet by mouth every 12 (twelve) hours. 30 tablet 0     Patient Stressors: Health problems   Medication change or noncompliance    Patient Strengths: Capable of independent living  Printmaker for treatment/growth  Supportive family/friends   Treatment Modalities: Medication Management, Group therapy, Case management,  1 to 1 session with clinician, Psychoeducation, Recreational therapy.   Physician Treatment Plan for Primary Diagnosis: Bipolar affective disorder, current episode manic with psychotic symptoms (HCC) Long Term Goal(s): Improvement in symptoms so as ready for discharge   Short Term Goals: Ability to identify and develop effective coping behaviors will improve Ability to maintain clinical measurements within  normal limits will improve Compliance with prescribed medications will improve Ability to identify changes in lifestyle to reduce recurrence of condition will improve Ability to verbalize feelings will improve Ability to disclose and discuss suicidal ideas Ability to demonstrate self-control will improve  Medication Management: Evaluate patient's response, side effects, and tolerance of medication regimen.  Therapeutic Interventions: 1 to 1 sessions, Unit Group sessions and Medication administration.  Evaluation of Outcomes: Adequate for Discharge  Physician Treatment Plan for Secondary Diagnosis: Principal Problem:   Bipolar affective disorder,  current episode manic with psychotic symptoms (HCC) Active Problems:   Major neurocognitive disorder (HCC)  Long Term Goal(s): Improvement in symptoms so as ready for discharge   Short Term Goals: Ability to identify and develop effective coping behaviors will improve Ability to maintain clinical measurements within normal limits will improve Compliance with prescribed medications will improve Ability to identify changes in lifestyle to reduce recurrence of condition will improve Ability to verbalize feelings will improve Ability to disclose and discuss suicidal ideas Ability to demonstrate self-control will improve     Medication Management: Evaluate patient's response, side effects, and tolerance of medication regimen.  Therapeutic Interventions: 1 to 1 sessions, Unit Group sessions and Medication administration.  Evaluation of Outcomes: Adequate for Discharge   RN Treatment Plan for Primary Diagnosis: Bipolar affective disorder, current episode manic with psychotic symptoms (HCC) Long Term Goal(s): Knowledge of disease and therapeutic regimen to maintain health will improve  Short Term Goals: Ability to remain free from injury will improve, Ability to verbalize frustration and anger appropriately will improve, Ability to demonstrate self-control, Ability to participate in decision making will improve, Ability to verbalize feelings will improve, Ability to disclose and discuss suicidal ideas, Ability to identify and develop effective coping behaviors will improve, and Compliance with prescribed medications will improve  Medication Management: RN will administer medications as ordered by provider, will assess and evaluate patient's response and provide education to patient for prescribed medication. RN will report any adverse and/or side effects to prescribing provider.  Therapeutic Interventions: 1 on 1 counseling sessions, Psychoeducation, Medication administration, Evaluate responses  to treatment, Monitor vital signs and CBGs as ordered, Perform/monitor CIWA, COWS, AIMS and Fall Risk screenings as ordered, Perform wound care treatments as ordered.  Evaluation of Outcomes: Adequate for Discharge   LCSW Treatment Plan for Primary Diagnosis: Bipolar affective disorder, current episode manic with psychotic symptoms (HCC) Long Term Goal(s): Safe transition to appropriate next level of care at discharge, Engage patient in therapeutic group addressing interpersonal concerns.  Short Term Goals: Engage patient in aftercare planning with referrals and resources, Increase social support, Increase ability to appropriately verbalize feelings, Increase emotional regulation, Facilitate acceptance of mental health diagnosis and concerns, Facilitate patient progression through stages of change regarding substance use diagnoses and concerns, Identify triggers associated with mental health/substance abuse issues, and Increase skills for wellness and recovery  Therapeutic Interventions: Assess for all discharge needs, 1 to 1 time with Social worker, Explore available resources and support systems, Assess for adequacy in community support network, Educate family and significant other(s) on suicide prevention, Complete Psychosocial Assessment, Interpersonal group therapy.  Evaluation of Outcomes: Adequate for Discharge   Progress in Treatment: Attending groups: Yes. Participating in groups: Yes. Taking medication as prescribed: Yes. Toleration medication: Yes. Family/Significant other contact made: Yes, individual(s) contacted:  SPE completed with [name], mother/guardian Patient understands diagnosis: Yes. Discussing patient identified problems/goals with staff: Yes. Medical problems stabilized or resolved: Yes. Denies suicidal/homicidal ideation: Yes. Issues/concerns per patient  self-inventory: Yes. Other: none  New problem(s) identified: No, Describe:  none  New Short Term/Long  Term Goal(s): PATIENT IS BOARDING. Patient to continue working towards treatment goals after discharge. Patient no longer meets criteria for inpatient criteria per attending physician. Continue taking medications as prescribed, nursing to provide instructions at discharge. Follow up with all scheduled appointments.   Patient Goals:  No additional goals identified at this time. Patient to continue to work towards original goals identified in initial treatment team meeting. CSW will remain available to patient should they voice additional treatment goals.   Discharge Plan or Barriers: Patient continues to lack adequate housing and supervision option. Does not currently have funding in order to pay for necessary care. CSW has coordinated for patient to apply for disability and medicaid.   Reason for Continuation of Hospitalization: N/A patient is boarding.   Estimated Length of Stay: TBD  Last 3 [place] Suicide Severity Risk Score: Flowsheet Row [facility] (Current) [facility] from 09/05/2021 in [facility] from 05/26/2020 in [facility]  C-SSRS RISK CATEGORY High Risk High Risk No Risk       Last PHQ 2/9 Scores:    05/26/2020    2:14 PM 09/03/2017    4:34 PM 08/07/2016    2:12 PM  Depression screen PHQ 2/9  Decreased Interest 3 3 3   Down, Depressed, Hopeless 3 1 2   PHQ - 2 Score 6 4 5   Altered sleeping 0 3 3  Tired, decreased energy 0 3 0  Change in appetite 3 3 0  Feeling bad or failure about yourself  0 3 1  Trouble concentrating 3 0 0  Moving slowly or fidgety/restless 3 0 1  Suicidal thoughts 0 0 0  PHQ-9 Score 15 16 10     Scribe for Treatment Team: , 11/21/2021 9:15 AM

## 2021-11-21 NOTE — Group Note (Signed)
Recreation Therapy Group Note   Group Topic:Team Building  Group Date: 11/21/2021 Start Time: 1010 End Time: 1100 Facilitators: Caroll Rancher, LRT,CTRS Location: [address] Dayroom   Goal Area(s) Addresses:  Patient will effectively work with peer towards shared goal.  Patient will identify skills used to make activity successful.  Patient will identify how skills used during activity can be used to reach post d/c goals.    Group Description:  Straw Bridge. In teams of 3-5, patients were given 15 plastic drinking straws and an equal length of masking tape. Using the materials provided, patients were instructed to build a free standing bridge-like structure to suspend an everyday item (ex: puzzle box) off of the floor or table surface. All materials were required to be used by the team in their design. LRT facilitated post-activity discussion reviewing team process. Patients were encouraged to reflect how the skills used in this activity can be generalized to daily life post discharge.    Affect/Mood: Flat   Participation Level: Moderate   Participation Quality: Moderate Cues   Behavior: Appropriate   Speech/Thought Process: Disorganized and Confused   Insight: Impaired   Judgement: Impaired   Modes of Intervention: STEM Activity   Patient Response to Interventions:  Challenging  and Receptive   Education Outcome:  Acknowledges education and In group clarification offered    Clinical Observations/Individualized Feedback: Pt was confused and had a hard time following along with the rules of the group.  Pt had a hard time understanding the larger group had been divided into two smaller groups.  Pt eventually joined a group and worked to help with the bridge.  Pt was able to maintain focus on the activity once she started.    Plan: Continue to engage patient in RT group sessions 2-3x/week.   Caroll Rancher, LRT,CTRS 11/21/2021 12:58 PM

## 2021-11-21 NOTE — Plan of Care (Signed)

## 2021-11-21 NOTE — Group Note (Signed)
[facility] LCSW Group Therapy Note  Date/Time: 11/21/2021 @ 1pm   Type of Therapy and Topic:  Group Therapy:  Who Am I?  Self Esteem, Self-Actualization and Understanding Self.  Participation Level:  Did not attend  Description of Group:    In this group patients will be asked to explore values, beliefs, truths, and morals as they relate to personal self.  Patients will be guided to discuss their thoughts, feelings, and behaviors related to what they identify as important to their true self. Patients will process together how values, beliefs and truths are connected to specific choices patients make every day. Each patient will be challenged to identify changes that they are motivated to make in order to improve self-esteem and self-actualization. This group will be process-oriented, with patients participating in exploration of their own experiences as well as giving and receiving support and challenge from other group members.  Therapeutic Goals: Patient will identify false beliefs that currently interfere with their self-esteem.  Patient will identify feelings, thought process, and behaviors related to self and will become aware of the uniqueness of themselves and of others.  Patient will be able to identify and verbalize values, morals, and beliefs as they relate to self. Patient will begin to learn how to build self-esteem/self-awareness by expressing what is important and unique to them personally.  Summary of Patient Progress: Did not attend       Therapeutic Modalities:   Cognitive Behavioral Therapy Solution Focused Therapy Motivational Interviewing Brief Therapy   Rolla Kedzierski, LCSW, LCAS Clincal Social Worker  [facility]

## 2021-11-21 NOTE — Progress Notes (Addendum)
[facility] MD Progress Note  11/21/2021 7:22 AM Natalie Brown  MRN:  [id number]  Reason for Admission:  Natalie Brown is a 56 y.o. female  who was initially admitted for inpatient psychiatric hospitalization on 09/06/2021 for management of mania, disorganized behaviors/thinking, and delusions. The patient is currently on Hospital Day 76.   Chart Review from last 24 hours:  The patient's chart was reviewed and nursing notes were reviewed. The patient's case was discussed in multidisciplinary team meeting. Per nursing, she attended some groups but had difficulty participating. She had no acute behavioral issues noted. Per [facility] she was compliant with scheduled medications except refusal of nicotine patch. She did receive Colace X1 PRN and Vistaril X1 PRN.  Information Obtained Today During Patient Interview: The patient was seen and evaluated on the unit. She was sleeping on entrance to her room but easily awakens. She reports good sleep and appetite and voices no physical complaints except some residual constipation. She denies AVH, SI, HI, ideas of reference or first rank symptoms. She does not make delusional statements unless prompted with questions about previously held delusional beliefs. She continues to endorse belief she can speak multiple languages, that she has 6 degrees in "diabetes and nutrition" and studied at [employer] from ages [age] after skipping 3 grades in school. She was prompted to attend to her hygiene since she is wearing same dirty scrub top. She states she showered this morning. Her room is unkempt and she was prompted to straighten the area. [name] perseverative on desire for discharge and is ruminating about need to be out to go to court and on need to transition from nicotine gum to patch.   Collateral : contacted guardian this morning and discussed progress - discussed slow taper off Haldol and Cogentin without worsening behavioral issues or return of mania and discussed  recommendations by Neurology for trial of Aricept for cognitive issues- time given for questions. Discussed constipation and treatment. Guardian gives consent for Aricept trial.  Principal Problem: Bipolar affective disorder, current episode manic with psychotic symptoms ([place]) Diagnosis: Principal Problem:   Bipolar affective disorder, current episode manic with psychotic symptoms ([place]) Active Problems:   Major neurocognitive disorder ([place])  Past Psychiatric History: see H&P  Past Medical History:  Past Medical History:  Diagnosis Date   Bipolar 1 disorder with moderate mania ([place]) 12/30/2013   Hypertension    Family History: see H&P  Family Psychiatric  History: see H&P  Social History:  Homeless; mother is new guardian   Sleep: Good  Appetite:  Good  Current Medications: Current Facility-Administered Medications  Medication Dose Route Frequency Provider Last Rate Last Admin   acetaminophen (TYLENOL) tablet 650 mg  650 mg Oral Q6H PRN Briant Cedar, MD   650 mg at 11/14/21 0759   alum & mag hydroxide-simeth (MAALOX/MYLANTA) 200-200-20 MG/5ML suspension 30 mL  30 mL Oral Q4H PRN Massengill, [name], MD       benztropine (COGENTIN) tablet 0.5 mg  0.5 mg Oral Daily Nelda Marseille, Akaiya Touchette E, MD       docusate sodium (COLACE) capsule 100 mg  100 mg Oral Daily PRN Corky Sox, MD   100 mg at 11/20/21 2979   feeding supplement (KATE FARMS STANDARD 1.4) liquid 325 mL  325 mL Oral BID BM Massengill, [name], MD   325 mL at 11/20/21 8921   haloperidol (HALDOL) tablet 3 mg  3 mg Oral QHS Harlow Asa, MD       hydrocerin (EUCERIN) cream  Topical BID Janine Limbo, MD   Given at 11/20/21 0340   hydrOXYzine (ATARAX) tablet 25 mg  25 mg Oral TID PRN Janine Limbo, MD   25 mg at 11/20/21 2058   LORazepam (ATIVAN) tablet 1 mg  1 mg Oral Q6H PRN Harlow Asa, MD   1 mg at 10/07/21 1546   magnesium hydroxide (MILK OF MAGNESIA) suspension 30 mL  30 mL Oral Daily PRN  Janine Limbo, MD   30 mL at 11/14/21 0802   nicotine (NICODERM CQ - dosed in mg/24 hours) patch 14 mg  14 mg Transdermal Daily Nelda Marseille, Lindey Renzulli E, MD       OLANZapine zydis (ZYPREXA) disintegrating tablet 5 mg  5 mg Oral Q8H PRN Harlow Asa, MD   5 mg at 10/14/21 1416   And   ziprasidone (GEODON) injection 20 mg  20 mg Intramuscular PRN Harlow Asa, MD       Lab Results:  Results for orders placed or performed during the hospital encounter of 09/06/21 (from the past 48 hour(s))  Basic metabolic panel     Status: Abnormal   Collection Time: 11/20/21  6:18 PM  Result Value Ref Range   Sodium 138 135 - 145 mmol/L   Potassium 3.8 3.5 - 5.1 mmol/L   Chloride 106 98 - 111 mmol/L   CO2 24 22 - 32 mmol/L   Glucose, Bld 128 (H) 70 - 99 mg/dL    Comment: Glucose reference range applies only to samples taken after fasting for at least 8 hours.   BUN 16 6 - 20 mg/dL   Creatinine, Ser 1.24 (H) 0.44 - 1.00 mg/dL   Calcium 8.6 (L) 8.9 - 10.3 mg/dL   GFR, Estimated 52 (L) >60 mL/min    Comment: (NOTE) Calculated using the CKD-EPI Creatinine Equation (2021)    Anion gap 8 5 - 15    Comment: Performed at [facility], [place] [address], [place], [place] [address]      Blood Alcohol level:  Lab Results  Component Value Date   ETH <10 18/59/0931   Metabolic Disorder Labs: Lab Results  Component Value Date   HGBA1C 5.3 09/08/2021   MPG 105.41 09/08/2021   No results found for: "PROLACTIN" Lab Results  Component Value Date   CHOL 173 09/08/2021   TRIG 110 09/08/2021   HDL 50 09/08/2021   CHOLHDL 3.5 09/08/2021   VLDL 22 09/08/2021   LDLCALC 101 (H) 09/08/2021   LDLCALC 102 (H) 06/21/2020   Physical Findings:  AIMS: Facial and Oral Movements Muscles of Facial Expression: None, normal Lips and Perioral Area: None, normal Jaw: None, normal Tongue: None, normal,Extremity Movements Upper (arms, wrists, hands, fingers): None, normal Lower (legs, knees,  ankles, toes): None, normal, Trunk Movements Neck, shoulders, hips: None, normal, Overall Severity Severity of abnormal movements (highest score from questions above): None, normal Incapacitation due to abnormal movements: None, normal Patient's awareness of abnormal movements (rate only patient's report): No Awareness, Dental Status Current problems with teeth and/or dentures?: No Does patient usually wear dentures?: No   Musculoskeletal: Strength & Muscle Tone: within normal limits Gait & Station: normal Patient leans: N/A  Psychiatric Specialty Exam:  Presentation  General Appearance: unkempt appearing - wearing hair bonnet and dirty scrub top with yoga pants  Eye Contact:Fair  Speech:rambling but normal fluency  Speech Volume:Normal  Mood and Affect  Mood:described as "okay" - appears calm  Affect: calm, polite, mildly constricted  Thought Process  Thought Processes:  perseverative and ruminative, concrete  Orientation:Oriented to month but not year; oriented to city, Software engineer, and recent July 4 holiday but not situation  Thought Content: Continues to have baseline, fixed delusions but does not mention these unless prompted with questions; is not grossly responding to internal/external stimuli on exam; denies SI, HI, AVH or paranoia and denies ideas of reference or first rank symptoms but is guarded and perseverative about need for continued admission   Hallucinations:Denied  Ideas of Reference:Denied  Suicidal Thoughts:Denied   Homicidal Thoughts:Denied   Sensorium  Memory:Limited   Judgment:Impaired  Insight:Poor  Executive Functions  Concentration:Poor  Attention Span:Poor  Recall:untested  Fund of Knowledge:Limited  Language:Fair  Psychomotor Activity  Psychomotor Activity:no restlessness, akathisia or tremors noted   Assets  Assets:Social Support  Sleep  Total time unrecorded  Physical Exam Vitals and nursing note reviewed.   Constitutional:      Appearance: Normal appearance.  HENT:     Head: Normocephalic.  Pulmonary:     Effort: Pulmonary effort is normal.  Neurological:     General: No focal deficit present.     Mental Status: [name] alert.    Review of Systems  Respiratory:  Negative for shortness of breath.   Cardiovascular:  Negative for chest pain.  Gastrointestinal:  Negative for abdominal pain, diarrhea, nausea and vomiting. Positive for constipation Neurological:  Negative for headaches.   Blood pressure 117/80, pulse (!) 114, temperature 98.3 F (36.8 C), temperature source Oral, resp. rate 16, height 5' 6"  (1.676 m), weight 72.6 kg, SpO2 100 %. Body mass index is 25.83 kg/m.  Treatment Plan Summary: ASSESSMENT: At this time, the patient's mania has resolved with medications and she appears to have residual, fixed delusions. It is unclear if she has had a bipolar or schizoaffective d/o presentation prior to this hospitalization in the past, but overall the mania she had on admission has resolved and her mood stability and behavioral control are improved at this time on medications. Given her brain atrophy and cognitive testing scores, she appears to have a major neurocognitive d/o of unclear etiology also present.   Diagnoses / Active Problems: Bipolar I MRE manic with psychotic features Major Neurocognitive d/o with behavioral disturbance  PLAN: Safety and Monitoring:  -- Voluntary admission (signed in by guardian for treatment on 11/19/21) to inpatient psychiatric unit for safety, stabilization and treatment  -- Daily contact with patient to assess and evaluate symptoms and progress in treatment  -- Patient's case to be discussed in multi-disciplinary team meeting  -- Observation Level : q15 minute checks  -- Vital signs:  q12 hours  -- Precautions: suicide, elopement, and assault  2. Psychiatric Diagnoses and Treatment:   Bipolar I MRE manic with psychotic features (r/o  schizoaffective d/o - bipolar type) Major neurocognitive disorder with behavioral disturbance (marked for age brain atrophy on CT head and MOCA 11/30)   -Reduce Haldol to 20m po qhs starting tonight - goal to gradually taper off to monotherapy with Invega sustenna -Continue Cogentin to 0.5 mg daily with taper down on Haldol   -Invega Sustenna 234 mg IM given on 09-30-21, 1591mIM given 10/12/20 - Invega 78 mg given on 7/21, reduced dose given due to CrCl of 54 after consultation with pharmacist regarding monthly maintenance dose and consent obtained from guardian for medication administration -- Zyprexa/Geodon/ativan agitation protocol PRN   --Additional psychosis labs (HIV- , RPR-, ANA-, heavy metal WNL, ESR wnl, ceruloplasmin wnl and CT head shows Marked for age brain atrophy.  No acute or reversible finding.)   --Depakote, Ativan, and Zyprexa stopped -- ACTT referral pending -- Recommend Neurology f/u after discharge for dementia evaluation/management - contacted Neurology, Dr. Quinn Axe on 8/2 and discussed the case - she recommends start of Aricept 70m daily and if tolerated increase in a month to 141m- I contacted her guardian and discussed Aricept r/b/se/a and she agrees to start of this medication. Will attempt to repeat MOCA as she will agree to testing. -- Interim guardianship in place with mother and waiting on SSDI and medicaid for group home referral  3. Medical Issues Being Addressed:   Tobacco Use Disorder  -- Nicotine patch 1453m4 hours ordered and d/c nicotine gum    Hyperammonemia -resolved --Ammonia level 30 on 10/27/21 --Depakote was stopped    Mildly elevated creatinine  --Creatinine of 1.24 on 8/1  -- Continue to encourage po fluids    Seasonal allergies --Continue Claritin 64m73mily PRN and d/c Flonase at request of guardian  Constipation -- Monitor for GI side-effects with start of Aricept and hope constipation improves with taper down on Cogentin -- Increase colace  to 100mg17m and encouraged po fluids -- PRN Miralax for severe constipation    4. Discharge Planning:   -- Social work and case management to assist with discharge planning and identification of hospital follow-up needs prior to discharge  -- Estimated LOS: 5-7 days  -- Discharge Concerns: Need to establish a safety plan; Medication compliance and effectiveness  -- Discharge Goals: Return home with outpatient referrals for mental health follow-up including medication management/psychotherapy  Labs On Admission- CMP: WNL except Cr: 1.1,  GFR: 60,  CBC: WNL,  UDS: Neg,  Troponin: 3,  EtOH: Neg,  beta hCG: 6.4,  Resp Panel: Neg 5/29- Urine Preg: Neg,  A1c: 5.3,  TSH: 1.719,  Lipid Panel: WNL except LDL: 101 5/24- BMP: WNL except Cr: 1.01,  Ca: 8.4,  hCG: 5,  Urine Preg: Neg 5/28- Ammonia: 32,  CBC: WNL,  CMP: WNL except  Cr: 1.08,  Ca: 8.6,  Depakote lvl: 20 6/3- Depakote lvl: 64, Hep Function: WNL,  CBC: WNL except Hem: 11.9 6/7- Depakote lvl: 75 6/18- UA: Neg,  CBC: WNL,  Depakote lvl: 136,  Ammonia: 99,  BMP: WNL except BUN: 22,  Cr: 1.19,  Ca: 8.6,  GFR: 54,  Hep Function: WNL except Albu: 3.3,  Indirect Bili: 0.2 6/20- ANA: Neg,  Ammonia: 48,  Ceruloplasmin: 24.2,  CMP: WNL except BUN: 23,  Cr: 1.35,  Ca: 8.3,  Albu: 3.4  HIV:Neg,  RPR: Neg,  Sed Rate: 6,  Depakote lvl: 25,  Heavy Metal: Neg 6/23- Ammonia: 46,  BMP: WNL except Cr: 1.2,  Ca: 8.3,  GFR: 54 6/24- UA: WNL except Bacteria: rare 6/30- CMP:  WNL except Cr: 1.04,  Ca: 8.6,  CBC: WNL,  Ammonia: 41, EKG: NSR with Qtc: 399 7/8 - CMP WNL other than CR 1.33 and GFR 48; Ammonia 30 7/18 - Cr of 1.22, eGFR of 53 7/23 - EKG with Qtc of 422 7/31 - creatinine 1.08 8/1 - creatinine 1.24  Kedra Mcglade EHarlow Asa FAPA 11/21/2021, 7:22 AM

## 2021-11-21 NOTE — Progress Notes (Signed)
   11/21/21 0100  Psych Admission Type (Psych Patients Only)  Admission Status Involuntary  Psychosocial Assessment  Patient Complaints Suspiciousness  Eye Contact Fair  Facial Expression Anxious  Affect Appropriate to circumstance  Speech Soft  Interaction Cautious  Motor Activity Slow  Appearance/Hygiene Disheveled  Behavior Characteristics Cooperative  Mood Preoccupied;Pleasant  Thought Process  Coherency Disorganized  Content Preoccupation  Delusions None reported or observed  Perception WDL  Hallucination None reported or observed  Judgment Poor  Confusion Mild  Danger to Self  Current suicidal ideation? Denies  Agreement Not to Harm Self Yes  Description of Agreement Verbal  Danger to Others  Danger to Others None reported or observed

## 2021-11-21 NOTE — Progress Notes (Signed)
Offered to assist patient with her shower, patient states "I had my shower at 5 am, I don't need any shower. Patient agreed to changing to a clean blouse, but insisted on not taking her jacket off to be washed. Patient had dirty toilet paper all over room and on the bed, and became upset when this writer cleaned them from the room. No acute distress noted at this time. Staff will continue to monitor patient.

## 2021-11-21 NOTE — Progress Notes (Signed)
   11/21/21 0923  Psych Admission Type (Psych Patients Only)  Admission Status Voluntary  Psychosocial Assessment  Patient Complaints Suspiciousness  Eye Contact Fair  Facial Expression Anxious  Affect Appropriate to circumstance  Speech Soft  Interaction Cautious  Motor Activity Slow  Appearance/Hygiene Disheveled  Behavior Characteristics Cooperative  Mood Preoccupied;Pleasant  Thought Process  Coherency Disorganized  Content Preoccupation  Delusions None reported or observed  Perception WDL  Hallucination None reported or observed  Judgment Impaired  Confusion Moderate  Danger to Self  Current suicidal ideation? Denies  Agreement Not to Harm Self Yes  Description of Agreement verbal  Danger to Others  Danger to Others None reported or observed

## 2021-11-21 NOTE — [facility] Group Notes (Signed)
Adult Psychoeducational Group Note  Date:  11/21/2021 Time:  9:50 PM  Group Topic/Focus:  Wrap-Up Group:   The focus of this group is to help patients review their daily goal of treatment and discuss progress on daily workbooks.  Participation Level:  Active  Participation Quality:  Inattentive  Affect:  Appropriate  Cognitive:  Disorganized and Confused  Insight: Appropriate  Engagement in Group:  Distracting  Modes of Intervention:  Discussion, Education, and Limit-setting  Additional Comments:  Pt attended and participated in group. Pt was preoccupied with wanting to watch T.V and proceeded to interrupt multiple pt's concerning watching T.V. Pt was redirected multiple times by Clinical research associate. Pt states that they went to groups and was able to speak with the Dr. When asked about their evening visit from a family member, pt proceeded to say they the visitor was the Dr. Rock Nephew has no concerns to report at this time.   Chrisandra Netters 11/21/2021, 9:50 PM

## 2021-11-21 NOTE — [facility] Group Notes (Signed)
Adult Psychoeducational Group Note  Date:  11/21/2021 Time:  9:37 AM  Group Topic/Focus:  Goals Group:   The focus of this group is to help patients establish daily goals to achieve during treatment and discuss how the patient can incorporate goal setting into their daily lives to aide in recovery.  Participation Level:  Did Not Attend   Natalie Brown 11/21/2021, 9:37 AM

## 2021-11-21 NOTE — Plan of Care (Signed)
  Problem: Education: Goal: Knowledge of [facility] Education information/materials will improve Outcome: Not Progressing Goal: Emotional status will improve Outcome: Not Progressing Goal: Mental status will improve Outcome: Not Progressing Goal: Verbalization of understanding the information provided will improve Outcome: Not Progressing   Problem: Activity: Goal: Interest or engagement in activities will improve Outcome: Not Progressing Goal: Sleeping patterns will improve Outcome: Not Progressing   Problem: Coping: Goal: Ability to verbalize frustrations and anger appropriately will improve Outcome: Not Progressing Goal: Ability to demonstrate self-control will improve Outcome: Not Progressing   Problem: Health Behavior/Discharge Planning: Goal: Identification of resources available to assist in meeting health care needs will improve Outcome: Not Progressing Goal: Compliance with treatment plan for underlying cause of condition will improve Outcome: Not Progressing   Problem: Physical Regulation: Goal: Ability to maintain clinical measurements within normal limits will improve Outcome: Not Progressing   Problem: Safety: Goal: Periods of time without injury will increase Outcome: Not Progressing   Problem: Education: Goal: Ability to make informed decisions regarding treatment will improve Outcome: Not Progressing   Problem: Coping: Goal: Coping ability will improve Outcome: Not Progressing   Problem: Health Behavior/Discharge Planning: Goal: Identification of resources available to assist in meeting health care needs will improve Outcome: Not Progressing   Problem: Medication: Goal: Compliance with prescribed medication regimen will improve Outcome: Not Progressing   Problem: Self-Concept: Goal: Ability to disclose and discuss suicidal ideas will improve Outcome: Not Progressing Goal: Will verbalize positive feelings about self Outcome: Not  Progressing   Problem: Activity: Goal: Will verbalize the importance of balancing activity with adequate rest periods Outcome: Not Progressing   Problem: Education: Goal: Will be free of psychotic symptoms Outcome: Not Progressing Goal: Knowledge of the prescribed therapeutic regimen will improve Outcome: Not Progressing   Problem: Coping: Goal: Coping ability will improve Outcome: Not Progressing Goal: Will verbalize feelings Outcome: Not Progressing   Problem: Health Behavior/Discharge Planning: Goal: Compliance with prescribed medication regimen will improve Outcome: Not Progressing   Problem: Nutritional: Goal: Ability to achieve adequate nutritional intake will improve Outcome: Not Progressing   Problem: Role Relationship: Goal: Ability to communicate needs accurately will improve Outcome: Not Progressing Goal: Ability to interact with others will improve Outcome: Not Progressing   Problem: Safety: Goal: Ability to redirect hostility and anger into socially appropriate behaviors will improve Outcome: Not Progressing Goal: Ability to remain free from injury will improve Outcome: Not Progressing   Problem: Self-Care: Goal: Ability to participate in self-care as condition permits will improve Outcome: Not Progressing   Problem: Self-Concept: Goal: Will verbalize positive feelings about self Outcome: Not Progressing

## 2021-11-22 DIAGNOSIS — F312 Bipolar disorder, current episode manic severe with psychotic features: Secondary | ICD-10-CM | POA: Diagnosis not present

## 2021-11-22 LAB — SARS CORONAVIRUS 2 BY RT PCR: SARS Coronavirus 2 by RT PCR: NEGATIVE

## 2021-11-22 MED ORDER — HALOPERIDOL 2 MG PO TABS
2.0000 mg | ORAL_TABLET | Freq: Once | ORAL | Status: AC
  Administered 2021-11-22: 2 mg via ORAL
  Filled 2021-11-22: qty 1

## 2021-11-22 MED ORDER — DONEPEZIL HCL 5 MG PO TABS
5.0000 mg | ORAL_TABLET | Freq: Every day | ORAL | Status: DC
  Administered 2021-11-23 – 2021-12-20 (×28): 5 mg via ORAL
  Filled 2021-11-22 (×32): qty 1

## 2021-11-22 MED ORDER — ONDANSETRON HCL 4 MG PO TABS
4.0000 mg | ORAL_TABLET | Freq: Three times a day (TID) | ORAL | Status: DC | PRN
  Administered 2021-11-23: 4 mg via ORAL
  Filled 2021-11-22: qty 1

## 2021-11-22 MED ORDER — HALOPERIDOL 2 MG PO TABS
2.0000 mg | ORAL_TABLET | Freq: Two times a day (BID) | ORAL | Status: DC
  Administered 2021-11-22 – 2021-12-19 (×52): 2 mg via ORAL
  Filled 2021-11-22 (×58): qty 1

## 2021-11-22 NOTE — Group Note (Signed)
Recreation Therapy Group Note   Group Topic:Leisure Education  Group Date: 11/22/2021 Start Time: 1000 End Time: 1045 Facilitators: Caroll Rancher, LRT,CTRS Location: [address] Hall Dayroom   Goal Area(s) Addresses:  Patient will review and complete packet supporting identification of healthy leisure and recreation activities.   Clinical Observations/Individualized Feedback:   Recreation therapy group did not occur due COVID being on the unit.  LRT provided pt a workbook reviewing leisure and its impact on emotional states and personal fulfillment. Pt given the option to complete the packet in the dayroom or at their own pace in their room.   Plan: Continue to engage patient in RT group sessions 2-3x/week.   Caroll Rancher, LRT,CTRS 11/22/2021 11:50 AM

## 2021-11-22 NOTE — [facility] Group Notes (Signed)
Adult Psychoeducational Group Note  Date:  11/22/2021 Time:  8:54 PM  Group Topic/Focus:  Wrap-Up Group:   The focus of this group is to help patients review their daily goal of treatment and discuss progress on daily workbooks.  Participation Level:  Active  Participation Quality:  Attentive  Affect:  Appropriate  Cognitive:  Alert  Insight: Appropriate  Engagement in Group:  Engaged  Modes of Intervention:  Discussion  Additional Comments:  Patient attended and participated in the Wrap-up group.  [name] 11/22/2021, 8:54 PM

## 2021-11-22 NOTE — Plan of Care (Signed)
  Problem: Activity: Goal: Interest or engagement in activities will improve Outcome: Progressing   Problem: Coping: Goal: Ability to demonstrate self-control will improve Outcome: Progressing   Problem: Health Behavior/Discharge Planning: Goal: Compliance with treatment plan for underlying cause of condition will improve Outcome: Progressing   

## 2021-11-22 NOTE — Progress Notes (Signed)
   11/22/21 0800  Psych Admission Type (Psych Patients Only)  Admission Status Voluntary  Psychosocial Assessment  Patient Complaints Suspiciousness  Eye Contact Fair  Facial Expression Anxious  Affect Appropriate to circumstance  Speech Soft  Interaction Cautious  Motor Activity Slow  Appearance/Hygiene Disheveled  Behavior Characteristics Appropriate to situation  Mood Anxious  Thought Process  Coherency Disorganized  Content Preoccupation  Delusions None reported or observed  Perception WDL  Hallucination None reported or observed  Judgment Impaired  Confusion Moderate  Danger to Self  Agreement Not to Harm Self Yes  Description of Agreement Verbal contracts for safety  Danger to Others  Danger to Others None reported or observed

## 2021-11-22 NOTE — Progress Notes (Signed)
   11/22/21 2145  Psych Admission Type (Psych Patients Only)  Admission Status Voluntary  Psychosocial Assessment  Patient Complaints Suspiciousness  Eye Contact Fair  Facial Expression Anxious  Affect Appropriate to circumstance  Speech Soft  Interaction Cautious;Childlike;Guarded  Motor Activity Slow  Appearance/Hygiene Disheveled  Behavior Characteristics Cooperative  Mood Suspicious;Preoccupied  Aggressive Behavior  Effect No apparent injury  Thought Process  Coherency Disorganized;Loose associations  Content Preoccupation  Delusions None reported or observed  Perception WDL  Hallucination None reported or observed  Judgment Impaired  Confusion Mild  Danger to Self  Current suicidal ideation? Denies  Danger to Others  Danger to Others None reported or observed

## 2021-11-22 NOTE — [facility] Group Notes (Signed)
Patient did not attend the relaxation group. 

## 2021-11-22 NOTE — Progress Notes (Signed)
[facility] MD Progress Note  11/22/2021 9:08 AM Natalie Brown  MRN:  [id number]  Reason for Admission:  Natalie Brown is a 56 y.o. female  who was initially admitted for inpatient psychiatric hospitalization on 09/06/2021 for management of mania, disorganized behaviors/thinking, and delusions. The patient is currently on Hospital Day 77.   Chart Review from last 24 hours:  The patient's chart was reviewed and nursing notes were reviewed. The patient's case was discussed in multidisciplinary team meeting. Per nursing, she remained anxious, disheveled, and disorganized on exam. She required frequent redirection in some groups and in RT group had trouble following instructions. Per MAR, she was compliant with scheduled medications and did require Vistaril X1 for anxiety/sleep.   Information Obtained Today During Patient Interview: The patient was seen and evaluated on the unit. Initially on entering room, she is sleeping soundly and appears groggy and confused upon awakening. She has trouble focusing on questions today and gets up in the middle of the interview randomly to use the bathroom. She denies AVH, paranoia, ideas of reference or first rank symptoms. She does not make delusional statements today. She reports good sleep and appetite and voices no physical complaints. She states she visited with her mom yesterday but does not recall content of their discussions. [name] wearing a new t-shirt today and states she has showered. Her room remains cluttered and she appears unkempt.She recognizes she needs to wash her clothes when prompted. She describes her mood as "fine." She remains ruminative about discharge planning. She denies medication side-effects.  Principal Problem: Bipolar affective disorder, current episode manic with psychotic symptoms ([place]) Diagnosis: Principal Problem:   Bipolar affective disorder, current episode manic with psychotic symptoms ([place]) Active Problems:   Major neurocognitive  disorder ([place])  Past Psychiatric History: see H&P  Past Medical History:  Past Medical History:  Diagnosis Date   Bipolar 1 disorder with moderate mania ([place]) 12/30/2013   Hypertension    Family History: see H&P  Family Psychiatric  History: see H&P  Social History:  Homeless; mother is interim guardian   Sleep: Good  Appetite:  Good  Current Medications: Current Facility-Administered Medications  Medication Dose Route Frequency Provider Last Rate Last Admin   acetaminophen (TYLENOL) tablet 650 mg  650 mg Oral Q6H PRN Briant Cedar, MD   650 mg at 11/14/21 0759   alum & mag hydroxide-simeth (MAALOX/MYLANTA) 200-200-20 MG/5ML suspension 30 mL  30 mL Oral Q4H PRN Massengill, [name], MD       benztropine (COGENTIN) tablet 0.5 mg  0.5 mg Oral Daily Nelda Marseille, Lelania Bia E, MD   0.5 mg at 11/22/21 0845   docusate sodium (COLACE) capsule 100 mg  100 mg Oral BID Nelda Marseille, Kieara Schwark E, MD   100 mg at 11/22/21 0844   donepezil (ARICEPT) tablet 5 mg  5 mg Oral Daily Nelda Marseille, Falisa Lamora E, MD   5 mg at 11/22/21 0845   feeding supplement (KATE FARMS STANDARD 1.4) liquid 325 mL  325 mL Oral BID BM Massengill, [name], MD   325 mL at 11/22/21 0845   haloperidol (HALDOL) tablet 3 mg  3 mg Oral QHS Nelda Marseille, Reganne Messerschmidt E, MD   3 mg at 11/21/21 2105   hydrocerin (EUCERIN) cream   Topical BID Janine Limbo, MD   Given at 11/22/21 0845   hydrOXYzine (ATARAX) tablet 25 mg  25 mg Oral TID PRN Janine Limbo, MD   25 mg at 11/21/21 2106   LORazepam (ATIVAN) tablet 1 mg  1 mg  Oral Q6H PRN Harlow Asa, MD   1 mg at 10/07/21 1546   magnesium hydroxide (MILK OF MAGNESIA) suspension 30 mL  30 mL Oral Daily PRN Janine Limbo, MD   30 mL at 11/14/21 0802   nicotine (NICODERM CQ - dosed in mg/24 hours) patch 14 mg  14 mg Transdermal Daily Nelda Marseille, Janaye [employer] E, MD   14 mg at 11/22/21 0845   OLANZapine zydis (ZYPREXA) disintegrating tablet 5 mg  5 mg Oral Q8H PRN Harlow Asa, MD   5 mg at 10/14/21 1416    And   ziprasidone (GEODON) injection 20 mg  20 mg Intramuscular PRN Harlow Asa, MD       polyethylene glycol (MIRALAX / GLYCOLAX) packet 17 g  17 g Oral Daily PRN Harlow Asa, MD       Lab Results:  Results for orders placed or performed during the hospital encounter of 09/06/21 (from the past 48 hour(s))  Basic metabolic panel     Status: Abnormal   Collection Time: 11/20/21  6:18 PM  Result Value Ref Range   Sodium 138 135 - 145 mmol/L   Potassium 3.8 3.5 - 5.1 mmol/L   Chloride 106 98 - 111 mmol/L   CO2 24 22 - 32 mmol/L   Glucose, Bld 128 (H) 70 - 99 mg/dL    Comment: Glucose reference range applies only to samples taken after fasting for at least 8 hours.   BUN 16 6 - 20 mg/dL   Creatinine, Ser 1.24 (H) 0.44 - 1.00 mg/dL   Calcium 8.6 (L) 8.9 - 10.3 mg/dL   GFR, Estimated 52 (L) >60 mL/min    Comment: (NOTE) Calculated using the CKD-EPI Creatinine Equation (2021)    Anion gap 8 5 - 15    Comment: Performed at [facility], [place] [address] [place], [place] [address]      Blood Alcohol level:  Lab Results  Component Value Date   ETH <10 09/47/0962   Metabolic Disorder Labs: Lab Results  Component Value Date   HGBA1C 5.3 09/08/2021   MPG 105.41 09/08/2021   No results found for: "PROLACTIN" Lab Results  Component Value Date   CHOL 173 09/08/2021   TRIG 110 09/08/2021   HDL 50 09/08/2021   CHOLHDL 3.5 09/08/2021   VLDL 22 09/08/2021   LDLCALC 101 (H) 09/08/2021   LDLCALC 102 (H) 06/21/2020   Physical Findings:  AIMS: Facial and Oral Movements Muscles of Facial Expression: None, normal Lips and Perioral Area: None, normal Jaw: None, normal Tongue: None, normal,Extremity Movements Upper (arms, wrists, hands, fingers): None, normal Lower (legs, knees, ankles, toes): None, normal, Trunk Movements Neck, shoulders, hips: None, normal, Overall Severity Severity of abnormal movements (highest score from questions above): None,  normal Incapacitation due to abnormal movements: None, normal Patient's awareness of abnormal movements (rate only patient's report): No Awareness, Dental Status Current problems with teeth and/or dentures?: No Does patient usually wear dentures?: No   Musculoskeletal: Strength & Muscle Tone: within normal limits Gait & Station: normal Patient leans: N/A  Psychiatric Specialty Exam:  Presentation  General Appearance: unkempt appearing - wearing hair bonnet, clean t-shirt today - casually dressed  Eye Contact:Fair  Speech:rambling but normal fluency  Speech Volume:Normal  Mood and Affect  Mood:described as "fine" - appears anxious  Affect: anxious, confused appearing  Thought Process  Thought Processes: concrete, ruminative about discharge planning; tangential and more easily distracted today  Orientation:Oriented to month but not year, place,  or situation  Thought Content: Does not make delusional statements today; is not grossly responding to internal/external stimuli on exam; denies SI, HI, AVH or paranoia and denies ideas of reference or first rank symptoms but is guarded and ruminative about need for continued admission   Hallucinations:Denied  Ideas of Reference:Denied  Suicidal Thoughts:Denied   Homicidal Thoughts:Denied   Sensorium  Memory:Limited  Judgment:Impaired  Insight:Poor  Executive Functions  Concentration:Poor  Attention Span:Poor  Recall:untested  Fund of Knowledge:Limited  Language:Fair  Psychomotor Activity  Psychomotor Activity:no restlessness, akathisia or tremors noted   Assets  Assets:Social Support  Sleep  6 hours  Physical Exam Vitals and nursing note reviewed.  Constitutional:      Appearance: Normal appearance.  HENT:     Head: Normocephalic.  Pulmonary:     Effort: Pulmonary effort is normal.  Neurological:     General: No focal deficit present.     Mental Status: [name] alert.    Review of Systems   Respiratory:  Negative for shortness of breath.   Cardiovascular:  Negative for chest pain.  Gastrointestinal:  Negative for abdominal pain, diarrhea, nausea and vomiting. Positive for constipation Neurological:  Negative for headaches.   Blood pressure 125/83, pulse (!) 115, temperature 98.3 F (36.8 C), temperature source Oral, resp. rate 16, height 5' 6"  (1.676 m), weight 72.6 kg, SpO2 100 %. Body mass index is 25.83 kg/m.  Treatment Plan Summary: ASSESSMENT: At this time,the patient appears more confused overall today but this could be related to being sleepy and just being awakened from sleep prior to exam. We will continue to monitor mood and cognitive functioning with start of Aricept and titration down on Haldol.   Diagnoses / Active Problems: Bipolar I MRE manic with psychotic features Major Neurocognitive d/o with behavioral disturbance  PLAN: Safety and Monitoring:  -- Voluntary admission (signed in by guardian for treatment on 11/19/21) to inpatient psychiatric unit for safety, stabilization and treatment  -- Daily contact with patient to assess and evaluate symptoms and progress in treatment  -- Patient's case to be discussed in multi-disciplinary team meeting  -- Observation Level : q15 minute checks  -- Vital signs:  q12 hours  -- Precautions: suicide, elopement, and assault  2. Psychiatric Diagnoses and Treatment:   Bipolar I MRE manic with psychotic features (r/o schizoaffective d/o - bipolar type) Major neurocognitive disorder with behavioral disturbance (marked for age brain atrophy on CT head and MOCA 11/30)   -Continue Haldol 558m po qhs starting tonight - goal to gradually taper off to monotherapy with Invega sustenna -Continue Cogentin to 0.5 mg daily with taper down on Haldol   -Invega Sustenna 234 mg IM given on 09-30-21, 15758mIM given 10/12/20 - Invega 78 mg given on 7/21, reduced dose given due to CrCl of 54 after consultation with pharmacist regarding  monthly maintenance dose and consent obtained from guardian for medication administration -- Zyprexa/Geodon/ativan agitation protocol PRN   --Additional psychosis labs (HIV- , RPR-, ANA-, heavy metal WNL, ESR wnl, ceruloplasmin wnl and CT head shows Marked for age brain atrophy.  No acute or reversible finding.)   --Depakote, Ativan, and Zyprexa stopped -- ACTT referral pending -- Continue Aricept 58m52maily and attempt to repeat MOCNoxapateren she will cooperate for testing -- Interim guardianship in place with mother and waiting on SSDI and medicaid for group home referral  3. Medical Issues Being Addressed:   Tobacco Use Disorder  -- Nicotine patch 94m30m hours ordered and d/c nicotine gum  Hyperammonemia -resolved --Ammonia level 30 on 10/27/21 --Depakote was stopped    Mildly elevated creatinine  --Creatinine of 1.24 on 8/1  -- Continue to encourage po fluids    Seasonal allergies --Continue Claritin 63m daily PRN and d/c Flonase at request of guardian  Constipation -- Monitor for GI side-effects with start of Aricept and hope constipation improves with taper down on Cogentin -- Continue colace 1053mbid and encouraged po fluids -- PRN Miralax for severe constipation    4. Discharge Planning:   -- Social work and case management to assist with discharge planning and identification of hospital follow-up needs prior to discharge  -- Discharge Concerns: Need to establish a safety plan; Medication compliance and effectiveness  -- Discharge Goals: Return home with outpatient referrals for mental health follow-up including medication management/psychotherapy  Labs On Admission- CMP: WNL except Cr: 1.1,  GFR: 60,  CBC: WNL,  UDS: Neg,  Troponin: 3,  EtOH: Neg,  beta hCG: 6.4,  Resp Panel: Neg 5/29- Urine Preg: Neg,  A1c: 5.3,  TSH: 1.719,  Lipid Panel: WNL except LDL: 101 5/24- BMP: WNL except Cr: 1.01,  Ca: 8.4,  hCG: 5,  Urine Preg: Neg 5/28- Ammonia: 32,  CBC: WNL,  CMP: WNL except   Cr: 1.08,  Ca: 8.6,  Depakote lvl: 20 6/3- Depakote lvl: 64, Hep Function: WNL,  CBC: WNL except Hem: 11.9 6/7- Depakote lvl: 75 6/18- UA: Neg,  CBC: WNL,  Depakote lvl: 136,  Ammonia: 99,  BMP: WNL except BUN: 22,  Cr: 1.19,  Ca: 8.6,  GFR: 54,  Hep Function: WNL except Albu: 3.3,  Indirect Bili: 0.2 6/20- ANA: Neg,  Ammonia: 48,  Ceruloplasmin: 24.2,  CMP: WNL except BUN: 23,  Cr: 1.35,  Ca: 8.3,  Albu: 3.4  HIV:Neg,  RPR: Neg,  Sed Rate: 6,  Depakote lvl: 25,  Heavy Metal: Neg 6/23- Ammonia: 46,  BMP: WNL except Cr: 1.2,  Ca: 8.3,  GFR: 54 6/24- UA: WNL except Bacteria: rare 6/30- CMP:  WNL except Cr: 1.04,  Ca: 8.6,  CBC: WNL,  Ammonia: 41, EKG: NSR with Qtc: 399 7/8 - CMP WNL other than CR 1.33 and GFR 48; Ammonia 30 7/18 - Cr of 1.22, eGFR of 53 7/23 - EKG with Qtc of 422 7/31 - creatinine 1.08 8/1 - creatinine 1.24  AmHarlow AsaMD, FAPA 11/22/2021, 9:08 AM

## 2021-11-22 NOTE — Progress Notes (Signed)
Patient vomited after eating some bites of sandwich served for lunch. Patient did not show any sign of weakness or acute distress. Patient went to sleep a few minutes after throwing up and slept for about 5 hours. She was awaken at 1800 to check her vital signs and to get her medication. Patient is in the day room at this time. No acute distress noted at this time. Staff will continue to monitor.

## 2021-11-22 NOTE — Progress Notes (Signed)
    11/22/21 0500  Sleep  Number of Hours 6

## 2021-11-22 NOTE — Plan of Care (Addendum)
Notified by nursing that patient had a episode of vomiting on the unit. I went to assist with cleaning patient up at which time she was confused, resistant to take off her clothes that had vomitus on them, and could not follow simple instructions, was perseverative about wanting to clean the floor with paper towels, and requiring frequent redirection. I have ordered PRN Zofran and COVID swab is pending given possible COVID exposure on the unit. In the event that the start of Aricept has caused GI sx, will need continued monitoring. If GI sx persist may need to hold medication. She appears to have clinically decompensated with dose reduction in Haldol and will therefore titrate back up to 2mg  bid and continue to monitor. Given level of confusion and vomiting, will check CBC, CMP, ammonia and lipase. She would not cooperate for an abdominal exam but voices no pain complaints.  I attempted to reach guardian to provide update but no answer. I left a HIPPA compliant voicemail asking for return call.  , MD, Bartholomew Crews

## 2021-11-22 NOTE — Progress Notes (Signed)
     11/21/21 2105  Psych Admission Type (Psych Patients Only)  Admission Status Voluntary  Psychosocial Assessment  Patient Complaints Suspiciousness  Eye Contact Fair  Facial Expression Anxious  Affect Appropriate to circumstance  Speech Soft  Interaction Cautious  Motor Activity Slow  Appearance/Hygiene Disheveled  Behavior Characteristics Cooperative;Calm  Mood Pleasant;Preoccupied  Thought Process  Coherency Disorganized  Content Preoccupation  Delusions None reported or observed  Perception WDL  Hallucination None reported or observed  Judgment Impaired  Confusion Moderate  Danger to Self  Current suicidal ideation? Denies  Agreement Not to Harm Self Yes  Description of Agreement verbal contract for safety  Danger to Others  Danger to Others None reported or observed

## 2021-11-23 DIAGNOSIS — F312 Bipolar disorder, current episode manic severe with psychotic features: Secondary | ICD-10-CM | POA: Diagnosis not present

## 2021-11-23 LAB — COMPREHENSIVE METABOLIC PANEL
ALT: 16 U/L (ref 0–44)
AST: 23 U/L (ref 15–41)
Albumin: 3.6 g/dL (ref 3.5–5.0)
Alkaline Phosphatase: 57 U/L (ref 38–126)
Anion gap: 9 (ref 5–15)
BUN: 14 mg/dL (ref 6–20)
CO2: 23 mmol/L (ref 22–32)
Calcium: 9 mg/dL (ref 8.9–10.3)
Chloride: 107 mmol/L (ref 98–111)
Creatinine, Ser: 1.05 mg/dL — ABNORMAL HIGH (ref 0.44–1.00)
GFR, Estimated: >60 mL/min (ref >60)
Glucose, Bld: 122 mg/dL — ABNORMAL HIGH (ref 70–99)
Potassium: 4.4 mmol/L (ref 3.5–5.1)
Sodium: 139 mmol/L (ref 135–145)
Total Bilirubin: 0.6 mg/dL (ref 0.3–1.2)
Total Protein: 7.7 g/dL (ref 6.5–8.1)

## 2021-11-23 LAB — CBC WITH DIFFERENTIAL/PLATELET
Abs Immature Granulocytes: 0.02 10*3/uL (ref 0.00–0.07)
Basophils Absolute: 0 10*3/uL (ref 0.0–0.1)
Basophils Relative: 0 %
Eosinophils Absolute: 0.1 10*3/uL (ref 0.0–0.5)
Eosinophils Relative: 1 %
HCT: 42.5 % (ref 36.0–46.0)
Hemoglobin: 14.2 g/dL (ref 12.0–15.0)
Immature Granulocytes: 0 %
Lymphocytes Relative: 25 %
Lymphs Abs: 2.1 10*3/uL (ref 0.7–4.0)
MCH: 30.1 pg (ref 26.0–34.0)
MCHC: 33.4 g/dL (ref 30.0–36.0)
MCV: 90 fL (ref 80.0–100.0)
Monocytes Absolute: 0.3 10*3/uL (ref 0.1–1.0)
Monocytes Relative: 4 %
Neutro Abs: 5.8 10*3/uL (ref 1.7–7.7)
Neutrophils Relative %: 70 %
Platelets: 265 10*3/uL (ref 150–400)
RBC: 4.72 MIL/uL (ref 3.87–5.11)
RDW: 13.4 % (ref 11.5–15.5)
WBC: 8.4 10*3/uL (ref 4.0–10.5)
nRBC: 0 % (ref 0.0–0.2)

## 2021-11-23 LAB — AMMONIA: Ammonia: 25 umol/L (ref 9–35)

## 2021-11-23 LAB — LIPASE, BLOOD: Lipase: 35 U/L (ref 11–51)

## 2021-11-23 MED ORDER — BENZTROPINE MESYLATE 0.5 MG PO TABS
0.5000 mg | ORAL_TABLET | Freq: Two times a day (BID) | ORAL | Status: DC
  Administered 2021-11-23 – 2021-12-10 (×33): 0.5 mg via ORAL
  Filled 2021-11-23 (×37): qty 1

## 2021-11-23 NOTE — Progress Notes (Addendum)
[facility] MD Progress Note  11/23/2021 8:15 AM Natalie Brown  MRN:  [id number]  Reason for Admission:  Natalie Brown is a 56 y.o. female  who was initially admitted for inpatient psychiatric hospitalization on 09/06/2021 for management of mania, disorganized behaviors/thinking, and delusions. The patient is currently on Hospital Day 78.   Chart Review from last 24 hours:  The patient's chart was reviewed and nursing notes were reviewed. The patient's case was discussed in multidisciplinary team meeting. Per nursing report, the patient had an episode of emesis yesterday and then slept most of the afternoon. She remained guarded, child-like at times, cautious, and disorganized. She did attend evening wrap up group. She was felt to be more confused yesterday overall on exam. Per MAR she was compliant with scheduled medications and did receive Vistaril X1 yesterday PRN. She received Zofran X1 this morning and Vistaril X1 this morning PRN.  Information Obtained Today During Patient Interview: The patient was seen and evaluated on the unit. [name] more alert today and is able to follow simple instructions on exam today. [name] noted to have some drooling but voices no physical complaints. She states she no longer has nausea and ate breakfast well this morning. She thinks her vomiting yesterday was related to food content. She states she is moving her bowels, denies diarrhea or abdominal pain, and denies fever. She denies AVH, ideas of reference, first rank symptoms, or paranoia. She does not make delusional statements unless prompted with questions and continues to express plans for discharge to live with her sister even though it was explained again that this is not a suitable option. When questioned about why she is in the hospital, she derails into discussion about [place] [address] helping her find housing, the need for Medicaid, and then shows this examiner a copy of the court document regarding interim  guardianship. Her hygiene appears improved and she states she has showered.   Called Guardian/Mother 8/4: Updated mother on patient's GI sx and decompensation yesterday and discussed the need to titrate back up on her Haldol and Cogentin. Discussed lab values.  Discussed that we will continue to monitor on her present regimen with plans to attempt continued dose titration off Haldol over time. Time given for questions.   Principal Problem: Bipolar affective disorder, current episode manic with psychotic symptoms ([place]) Diagnosis: Principal Problem:   Bipolar affective disorder, current episode manic with psychotic symptoms ([place]) Active Problems:   Major neurocognitive disorder ([place])  Past Psychiatric History: see H&P  Past Medical History:  Past Medical History:  Diagnosis Date   Bipolar 1 disorder with moderate mania ([place]) 12/30/2013   Hypertension    Family History: see H&P  Family Psychiatric  History: see H&P  Social History:  Homeless; mother is interim guardian   Sleep: Good  Appetite:  Good  Current Medications: Current Facility-Administered Medications  Medication Dose Route Frequency Provider Last Rate Last Admin   acetaminophen (TYLENOL) tablet 650 mg  650 mg Oral Q6H PRN Briant Cedar, MD   650 mg at 11/14/21 0759   alum & mag hydroxide-simeth (MAALOX/MYLANTA) 200-200-20 MG/5ML suspension 30 mL  30 mL Oral Q4H PRN Massengill, [name], MD       benztropine (COGENTIN) tablet 0.5 mg  0.5 mg Oral Daily Nelda Marseille, Dianne Whelchel E, MD   0.5 mg at 11/22/21 0845   docusate sodium (COLACE) capsule 100 mg  100 mg Oral BID Harlow Asa, MD   100 mg at 11/22/21 1816  donepezil (ARICEPT) tablet 5 mg  5 mg Oral Daily Laurenashley Viar E, MD       feeding supplement (KATE FARMS STANDARD 1.4) liquid 325 mL  325 mL Oral BID BM Massengill, [name], MD   325 mL at 11/22/21 0845   haloperidol (HALDOL) tablet 2 mg  2 mg Oral BID Harlow Asa, MD   2 mg at 11/22/21 2106   hydrocerin  (EUCERIN) cream   Topical BID Janine Limbo, MD   Given at 11/22/21 1816   hydrOXYzine (ATARAX) tablet 25 mg  25 mg Oral TID PRN Janine Limbo, MD   25 mg at 11/22/21 2106   LORazepam (ATIVAN) tablet 1 mg  1 mg Oral Q6H PRN Harlow Asa, MD   1 mg at 10/07/21 1546   magnesium hydroxide (MILK OF MAGNESIA) suspension 30 mL  30 mL Oral Daily PRN Massengill, [name], MD   30 mL at 11/14/21 0802   nicotine (NICODERM CQ - dosed in mg/24 hours) patch 14 mg  14 mg Transdermal Daily Nelda Marseille, Wanita Derenzo E, MD   14 mg at 11/22/21 0845   OLANZapine zydis (ZYPREXA) disintegrating tablet 5 mg  5 mg Oral Q8H PRN Harlow Asa, MD   5 mg at 10/14/21 1416   And   ziprasidone (GEODON) injection 20 mg  20 mg Intramuscular PRN Harlow Asa, MD       ondansetron [facility]) tablet 4 mg  4 mg Oral Q8H PRN Harlow Asa, MD   4 mg at 11/23/21 1751   polyethylene glycol (MIRALAX / GLYCOLAX) packet 17 g  17 g Oral Daily PRN Harlow Asa, MD       Lab Results:  Results for orders placed or performed during the hospital encounter of 09/06/21 (from the past 48 hour(s))  SARS Coronavirus 2 by RT PCR (hospital order, performed in [facility] hospital lab) *cepheid single result test* Anterior Nasal Swab     Status: None   Collection Time: 11/22/21 12:59 PM   Specimen: Anterior Nasal Swab  Result Value Ref Range   SARS Coronavirus 2 by RT PCR NEGATIVE NEGATIVE    Comment: (NOTE) SARS-CoV-2 target nucleic acids are NOT DETECTED.  The SARS-CoV-2 RNA is generally detectable in upper and lower respiratory specimens during the acute phase of infection. The lowest concentration of SARS-CoV-2 viral copies this assay can detect is 250 copies / mL. A negative result does not preclude SARS-CoV-2 infection and should not be used as the sole basis for treatment or other patient management decisions.  A negative result may occur with improper specimen collection / handling, submission of specimen other than  nasopharyngeal swab, presence of viral mutation(s) within the areas targeted by this assay, and inadequate number of viral copies (<250 copies / mL). A negative result must be combined with clinical observations, patient history, and epidemiological information.  Fact Sheet for Patients:   https://www.patel.info/  Fact Sheet for Healthcare Providers: https://hall.com/  This test is not yet approved or  cleared by the [place] FDA and has been authorized for detection and/or diagnosis of SARS-CoV-2 by FDA under an Emergency Use Authorization (EUA).  This EUA will remain in effect (meaning this test can be used) for the duration of the COVID-19 declaration under Section 564(b)(1) of the Act, 21 U.S.C. section 360bbb-3(b)(1), unless the authorization is terminated or revoked sooner.  Performed at [facility], [place] [address], [place], [place] [address]   CBC with Differential/Platelet     Status:  None   Collection Time: 11/23/21  6:55 AM  Result Value Ref Range   WBC 8.4 4.0 - 10.5 K/uL   RBC 4.72 3.87 - 5.11 MIL/uL   Hemoglobin 14.2 12.0 - 15.0 g/dL   HCT 42.5 36.0 - 46.0 %   MCV 90.0 80.0 - 100.0 fL   MCH 30.1 26.0 - 34.0 pg   MCHC 33.4 30.0 - 36.0 g/dL   RDW 13.4 11.5 - 15.5 %   Platelets 265 150 - 400 K/uL   nRBC 0.0 0.0 - 0.2 %   Neutrophils Relative % 70 %   Neutro Abs 5.8 1.7 - 7.7 K/uL   Lymphocytes Relative 25 %   Lymphs Abs 2.1 0.7 - 4.0 K/uL   Monocytes Relative 4 %   Monocytes Absolute 0.3 0.1 - 1.0 K/uL   Eosinophils Relative 1 %   Eosinophils Absolute 0.1 0.0 - 0.5 K/uL   Basophils Relative 0 %   Basophils Absolute 0.0 0.0 - 0.1 K/uL   Immature Granulocytes 0 %   Abs Immature Granulocytes 0.02 0.00 - 0.07 K/uL    Comment: Performed at [facility], [place] [address] [place], [place] [address]      Blood Alcohol level:  Lab Results  Component Value Date   ETH <10  68/[birth year]1962   Metabolic Disorder Labs: Lab Results  Component Value Date   HGBA1C 5.3 09/08/2021   MPG 105.41 09/08/2021   No results found for: "PROLACTIN" Lab Results  Component Value Date   CHOL 173 09/08/2021   TRIG 110 09/08/2021   HDL 50 09/08/2021   CHOLHDL 3.5 09/08/2021   VLDL 22 09/08/2021   LDLCALC 101 (H) 09/08/2021   LDLCALC 102 (H) 06/21/2020   Physical Findings:  AIMS: Facial and Oral Movements Muscles of Facial Expression: None, normal Lips and Perioral Area: None, normal Jaw: None, normal Tongue: None, normal,Extremity Movements Upper (arms, wrists, hands, fingers): None, normal Lower (legs, knees, ankles, toes): None, normal, Trunk Movements Neck, shoulders, hips: None, normal, Overall Severity Severity of abnormal movements (highest score from questions above): None, normal Incapacitation due to abnormal movements: None, normal Patient's awareness of abnormal movements (rate only patient's report): No Awareness, Dental Status Current problems with teeth and/or dentures?: No Does patient usually wear dentures?: No   Musculoskeletal: Strength & Muscle Tone: within normal limits Gait & Station: normal Patient leans: N/A  Psychiatric Specialty Exam:  Presentation  General Appearance: improved hygiene - wearing clean clothing  Eye Contact:Fair  Speech:rambling but normal fluency  Speech Volume:Normal  Mood and Affect  Mood:described as "fine" - calm   Affect: less anxious appearing, less confused appearing  Thought Process  Thought Processes: concrete, ruminative about discharge planning; tangential but improved processing speed compared to yesterday and following instructions today  Orientation:Oriented to month and date not year, season, or situation - knows Software engineer is Medical illustrator Content: Does not make delusional statements today; is not grossly responding to internal/external stimuli on exam; denies SI, HI, AVH or paranoia and denies  ideas of reference or first rank symptoms but is suspicious and ruminative about need for continued admission   Hallucinations:Denied  Ideas of Reference:Denied  Suicidal Thoughts:Denied   Homicidal Thoughts:Denied   Sensorium  Memory:Limited  Judgment:Impaired  Insight:Poor  Executive Functions  Concentration:Poor  Attention Span:Poor  Recall:untested  Fund of Knowledge:Limited  Language:Fair  Psychomotor Activity  Psychomotor Activity:no restlessness, akathisia or tremors noted; following instructions today better   Assets  Assets:Social Support  Sleep  7  hours  Physical Exam Vitals and nursing note reviewed.  Constitutional:      Appearance: Normal appearance.  HENT:     Head: Normocephalic.     Mouth/Throat:     Comments: Drooling at times on exam Pulmonary:     Effort: Pulmonary effort is normal.  Neurological:     General: No focal deficit present.     Mental Status: [name] alert.    Review of Systems  Respiratory:  Negative for shortness of breath.   Cardiovascular:  Negative for chest pain.  Gastrointestinal:  Negative for abdominal pain, constipation and diarrhea.       Vomiting and nausea resolved  Neurological:  Negative for dizziness and headaches.     Blood pressure 118/83, pulse 97, temperature 98.1 F (36.7 C), temperature source Oral, resp. rate 16, height 5' 6"  (1.676 m), weight 72.6 kg, SpO2 100 %. Body mass index is 25.83 kg/m.  Treatment Plan Summary: ASSESSMENT: The patient appears more alert and less disorganized than yesterday. [name] able to follow some simple instructions and despite residual tangential and ruminative thoughts is more goal oriented today. We will continue to monitor on present regimen.  Diagnoses / Active Problems: Bipolar I MRE manic with psychotic features Major Neurocognitive d/o with behavioral disturbance  PLAN: Safety and Monitoring:  -- Voluntary admission (signed in by guardian for  treatment on 11/19/21) to inpatient psychiatric unit for safety, stabilization and treatment  -- Daily contact with patient to assess and evaluate symptoms and progress in treatment  -- Patient's case to be discussed in multi-disciplinary team meeting  -- Observation Level : q15 minute checks  -- Vital signs:  q12 hours  -- Precautions: suicide, elopement, and assault  2. Psychiatric Diagnoses and Treatment:   Bipolar I MRE manic with psychotic features (r/o schizoaffective d/o - bipolar type) Major neurocognitive disorder with behavioral disturbance (marked for age brain atrophy on CT head and MOCA 11/30)   -Due to worsening confusion yesterday and more disorganized behaviors, her Haldol was changed back to 34m bid - will continue to monitor  -Increase Cogentin to 0.5 mg bid given drooling   -Invega Sustenna 234 mg IM given on 09-30-21, 1526mIM given 10/12/20 - Invega 78 mg given on 7/21, reduced dose given due to CrCl of 54 after consultation with pharmacist regarding monthly maintenance dose and consent obtained from guardian for medication administration -- Zyprexa/Geodon/ativan agitation protocol PRN   --Additional psychosis labs (HIV- , RPR-, ANA-, heavy metal WNL, ESR wnl, ceruloplasmin wnl and CT head shows Marked for age brain atrophy.  No acute or reversible finding.)   --Depakote, Ativan, and Zyprexa stopped -- ACTT referral pending -- Continue Aricept 74m24maily and attempt to repeat MOCA when she will cooperate for testing - monitoring for GI sx on med -- Interim guardianship in place with mother and waiting on SSDI and medicaid for group home referral  3. Medical Issues Being Addressed:   Tobacco Use Disorder  -- Nicotine patch 30m28m hours ordered and d/c nicotine gum    Hyperammonemia -resolved --Ammonia level 30 on 10/27/21 and 25 on 8/4 --Depakote was stopped    Mildly elevated creatinine - improving  --Creatinine of 1.05 on 8/4  -- Continue to encourage po fluids     Seasonal allergies --Continue Claritin 10mg11mly PRN and d/c Flonase at request of guardian  Constipation - improved -- Monitor for GI side-effects with start of Aricept  -- Continue colace 100mg 1mand encouraged po fluids --  PRN Miralax for severe constipation  Nausea/Vomiting - resolved -- One episode of emesis yesterday  -- Patient did not get labs last night but instead this morning - CBC WNL, NH3 25, CMP WNL other than creatinine 1.05 and glucose 122, Lipase 35 -- Patient afebrile and COVID negative    4. Discharge Planning:   -- Social work and case management to assist with discharge planning and identification of hospital follow-up needs prior to discharge  -- Discharge Concerns: Need to establish a safety plan; Medication compliance and effectiveness; safe housing   -- Discharge Goals: ALF or group home with outpatient referrals for mental health follow-up including medication management/psychotherapy  Labs On Admission- CMP: WNL except Cr: 1.1,  GFR: 60,  CBC: WNL,  UDS: Neg,  Troponin: 3,  EtOH: Neg,  beta hCG: 6.4,  Resp Panel: Neg 5/29- Urine Preg: Neg,  A1c: 5.3,  TSH: 1.719,  Lipid Panel: WNL except LDL: 101 5/24- BMP: WNL except Cr: 1.01,  Ca: 8.4,  hCG: 5,  Urine Preg: Neg 5/28- Ammonia: 32,  CBC: WNL,  CMP: WNL except  Cr: 1.08,  Ca: 8.6,  Depakote lvl: 20 6/3- Depakote lvl: 64, Hep Function: WNL,  CBC: WNL except Hem: 11.9 6/7- Depakote lvl: 75 6/18- UA: Neg,  CBC: WNL,  Depakote lvl: 136,  Ammonia: 99,  BMP: WNL except BUN: 22,  Cr: 1.19,  Ca: 8.6,  GFR: 54,  Hep Function: WNL except Albu: 3.3,  Indirect Bili: 0.2 6/20- ANA: Neg,  Ammonia: 48,  Ceruloplasmin: 24.2,  CMP: WNL except BUN: 23,  Cr: 1.35,  Ca: 8.3,  Albu: 3.4  HIV:Neg,  RPR: Neg,  Sed Rate: 6,  Depakote lvl: 25,  Heavy Metal: Neg 6/23- Ammonia: 46,  BMP: WNL except Cr: 1.2,  Ca: 8.3,  GFR: 54 6/24- UA: WNL except Bacteria: rare 6/30- CMP:  WNL except Cr: 1.04,  Ca: 8.6,  CBC: WNL,  Ammonia: 41, EKG:  NSR with Qtc: 399 7/8 - CMP WNL other than CR 1.33 and GFR 48; Ammonia 30 7/18 - Cr of 1.22, eGFR of 53 7/23 - EKG with Qtc of 422 7/31 - creatinine 1.08 8/1 - creatinine 1.24 8/3 - COVID negative 8/4 - CBC WNL, NH3 25, CMP WNL other than creatinine 1.05 and glucose 122, lipase WNL  Harlow Asa, MD, FAPA 11/23/2021, 8:15 AM

## 2021-11-23 NOTE — Group Note (Signed)
[facility] LCSW Group Therapy Note   Group Date: 11/23/2021 Start Time: 1100 End Time: 1200   Type of Therapy/Topic:  Group Therapy:  Emotion Regulation  Participation Level:  Did Not Attend   Mood:  Description of Group:    The purpose of this group is to assist patients in learning to regulate negative emotions and experience positive emotions. Patients will be guided to discuss ways in which they have been vulnerable to their negative emotions. These vulnerabilities will be juxtaposed with experiences of positive emotions or situations, and patients challenged to use positive emotions to combat negative ones. Special emphasis will be placed on coping with negative emotions in conflict situations, and patients will process healthy conflict resolution skills.  Therapeutic Goals: Patient will identify two positive emotions or experiences to reflect on in order to balance out negative emotions:  Patient will label two or more emotions that they find the most difficult to experience:  Patient will be able to demonstrate positive conflict resolution skills through discussion or role plays:   Summary of Patient Progress:   Did not attend    Therapeutic Modalities:   Cognitive Behavioral Therapy Feelings Identification Dialectical Behavioral Therapy   Natalie Brown E Ryli Standlee, LCSW

## 2021-11-23 NOTE — [facility] Group Notes (Signed)
  Spirituality group facilitated by Kathleen Argue, BCC.   Group Description: Group focused on topic of hope. Patients participated in facilitated discussion around topic, connecting with one another around experiences and definitions for hope. Group members engaged with visual explorer photos, reflecting on what hope looks like for them today. Group engaged in discussion around how their definitions of hope are present today in hospital.   Modalities: Psycho-social ed, Adlerian, Narrative, MI   Patient Progress: Natalie Brown attended group and was in and out a few times.  She showed engagement when she was present. Her participation was very focused on just wanting to leave the hospital and at times that distracted from the group conversation.  [address] Bcc Pager, [phone]

## 2021-11-23 NOTE — Progress Notes (Signed)
   11/23/21 0515  Sleep  Number of Hours 7

## 2021-11-23 NOTE — [facility] Group Notes (Signed)
Adult Psychoeducational Group Note  Date:  11/23/2021 Time:  2:28 PM  Group Topic/Focus:  Wellness Toolbox:   The focus of this group is to discuss various aspects of wellness, balancing those aspects and exploring ways to increase the ability to experience wellness.  Patients will create a wellness toolbox for use upon discharge.  Participation Level:  Active  Participation Quality:  Attentive  Affect:  Appropriate  Cognitive:  Alert  Insight: Appropriate  Engagement in Group:  Engaged  Modes of Intervention:  Activity  Additional Comments:  Patient attended and participated in the relaxation group activity.  [name] 11/23/2021, 2:28 PM

## 2021-11-23 NOTE — Progress Notes (Signed)
[facility] LCSW Progress Note   11/23/2021    2:34 PM  RHAPSODY WOLVEN   [phone]   Type of Contact and Topic:  Care Coordination   CSW has made multiple attempts to reach Onnie Boer, [phone]) disability assistance coordinator at the [facility], pertaining to patient's disability application. Unable to reach [place]. Left HIPAA compliant voicemail with contact information and callback request.   Situation ongoing, CSW will continue to monitor and update note as more information becomes available. CSW will continue to make attempts to reach disability coordinator.     Signed:  [name], MSW, LCSWA, LCAS 11/23/2021 2:34 PM

## 2021-11-23 NOTE — Progress Notes (Signed)
   11/23/21 1100  Psych Admission Type (Psych Patients Only)  Admission Status Voluntary  Psychosocial Assessment  Patient Complaints Suspiciousness  Eye Contact Fair  Facial Expression Anxious  Affect Appropriate to circumstance  Speech Soft  Interaction Cautious  Motor Activity Slow  Appearance/Hygiene Disheveled  Behavior Characteristics Cooperative;Appropriate to situation  Mood Anxious;Preoccupied;Suspicious  Aggressive Behavior  Effect No apparent injury  Thought Process  Coherency Disorganized;Loose associations  Content Preoccupation  Delusions None reported or observed  Perception WDL  Hallucination None reported or observed  Judgment Impaired  Confusion Mild  Danger to Self  Current suicidal ideation? Denies  Agreement Not to Harm Self Yes  Description of Agreement Verbal  Danger to Others  Danger to Others None reported or observed

## 2021-11-23 NOTE — Group Note (Signed)
Recreation Therapy Group Note   Group Topic:Stress Management  Group Date: 11/23/2021 Start Time: 1000 End Time: 1035 Facilitators: Caroll Rancher, LRT,CTRS Location: [address] Dayroom   Goal Area(s) Addresses:  Patient will identify symptoms of stress. Patient will identify what causes stress. Patient will identify was to cope with stress post d/c.   Group Description:  LRT provided each patient with a packet that went over different elements of stress.  LRT reviewed and went over packet with the group.  LRT and patients discusses the physical, emotional and behavioral symptoms of stress and patients identified the symptoms they experience most.  Patients also identified triggers to stress and identified the things they can control versus what they can't.   Affect/Mood: Flat   Participation Level: None   Participation Quality: None   Behavior: Distracted   Speech/Thought Process: Distracted   Insight: Impaired   Judgement: Impaired   Modes of Intervention: Worksheet   Patient Response to Interventions:  Disengaged   Education Outcome:  Acknowledges education and In group clarification offered    Clinical Observations/Individualized Feedback: Pt was distracted about wanting coffee and there being none in the coffee pot.  Pt was unable to focus.  Pt left and did not return.    Plan: Continue to engage patient in RT group sessions 2-3x/week.   Caroll Rancher, LRT,CTRS 11/23/2021 12:42 PM

## 2021-11-23 NOTE — Progress Notes (Signed)
Adult Psychoeducational Group Note  Date:  11/23/2021 Time:  9:06 PM  Group Topic/Focus:  Wrap-Up Group:   The focus of this group is to help patients review their daily goal of treatment and discuss progress on daily workbooks.  Participation Level:  Did Not Attend  Participation Quality:  Did Not Attend  Affect:  Did Not Attend  Cognitive:  Did Not Attend  Insight: Did Not Attend  Engagement in Group:  Did Not Attend  Modes of Intervention:  Did Not Attend  Additional Comments:   Pt was encouraged to attend group discussion but refused   Vevelyn Pat 11/23/2021, 9:06 PM

## 2021-11-23 NOTE — Progress Notes (Signed)
   11/23/21 2015  Psych Admission Type (Psych Patients Only)  Admission Status Voluntary  Psychosocial Assessment  Patient Complaints Suspiciousness  Eye Contact Fair  Facial Expression Anxious  Affect Appropriate to circumstance  Speech Soft  Interaction Cautious;Childlike;Guarded  Motor Activity Slow  Appearance/Hygiene Disheveled  Behavior Characteristics Cooperative  Mood Anxious;Preoccupied  Aggressive Behavior  Effect No apparent injury  Thought Process  Coherency Disorganized;Loose associations  Content Preoccupation  Delusions None reported or observed  Perception WDL  Hallucination None reported or observed  Judgment Impaired  Confusion Mild  Danger to Self  Current suicidal ideation? Denies  Danger to Others  Danger to Others None reported or observed

## 2021-11-24 DIAGNOSIS — F312 Bipolar disorder, current episode manic severe with psychotic features: Secondary | ICD-10-CM | POA: Diagnosis not present

## 2021-11-24 NOTE — Progress Notes (Addendum)
[facility] MD Progress Note  11/24/2021 5:42 PM CHELSA STOUT  MRN:  [id number]  Subjective: Francena Hanly states, "I feel good, but I am ready to leave."  Brief History: OVETA IDRIS is a 56 y.o. female  who was initially admitted for inpatient psychiatric hospitalization on 09/06/2021 for management of mania, disorganized behaviors/thinking, and delusions. The patient is currently on Hospital Day 80.    Chart Review from last 24 hours:  The patient's chart was reviewed and nursing notes were reviewed. The patient's case was discussed in multidisciplinary team meeting.  She remained guarded, child-like at times, cautious, and disorganized. She did attend morning therapeutic Milieu and group activities. [name] calm today during assessment but states she is ready to be discharge to home. Per MAR she was compliant with scheduled medications and did receive Vistaril X1 PRN today for anxiety.   Assessment: On assessment today, patient is seen face-to-face and examined on [place].  She appears calm, alert, however, confused and kept reiterating that she is ready to leave.  Chart reviewed and findings shared with the treatment team and discussed with Dr. Nelda Marseille.  Partial orientation noted.  Able to respond to name and answer simple questions.  Mood less depressed and less anxious.  When asked about pocketing food in her mouth, responded, "I did not like the [place] sandwich, it made me want to throw up." [name] noted to have some drooling while drinking coffee but no choking or voicing  physical complaints.  When asked about this drooling while drinking coffee, stated, "that is because I need some braces."  Denies nausea nor vomiting today. She denies AVH, ideas of reference, first rank symptoms, or paranoia. She does not make delusional statements unless prompted with questions and continues to express plans for discharge to live with her sister even though it was explained again that this is not a suitable  option. When questioned about where she plans to go after discharge, stated [address] where my sister lives. Much improvement with her hygiene she had a shower yesterday.   Principal Problem: Bipolar affective disorder, current episode manic with psychotic symptoms ([place]) Diagnosis: Principal Problem:   Bipolar affective disorder, current episode manic with psychotic symptoms ([address]) Active Problems:   Major neurocognitive disorder ([place])  Past Psychiatric History: see H&P  Past Medical History:  Past Medical History:  Diagnosis Date   Bipolar 1 disorder with moderate mania ([place]) 12/30/2013   Hypertension    Family History: see H&P  Family Psychiatric  History: see H&P  Social History:  Homeless; mother is interim guardian   Sleep: Good  Appetite:  Good  Current Medications: Current Facility-Administered Medications  Medication Dose Route Frequency Provider Last Rate Last Admin   acetaminophen (TYLENOL) tablet 650 mg  650 mg Oral Q6H PRN Briant Cedar, MD   650 mg at 11/14/21 0759   alum & mag hydroxide-simeth (MAALOX/MYLANTA) 200-200-20 MG/5ML suspension 30 mL  30 mL Oral Q4H PRN Massengill, [name], MD       benztropine (COGENTIN) tablet 0.5 mg  0.5 mg Oral BID Nelda Marseille, Vuong Musa E, MD   0.5 mg at 11/24/21 1701   docusate sodium (COLACE) capsule 100 mg  100 mg Oral BID Nelda Marseille, Kresha Abelson E, MD   100 mg at 11/24/21 1701   donepezil (ARICEPT) tablet 5 mg  5 mg Oral Daily Nelda Marseille, Benno Brensinger E, MD   5 mg at 11/24/21 0809   feeding supplement (KATE FARMS STANDARD 1.4) liquid 325 mL  325 mL Oral BID BM Massengill, [name], MD   325 mL at 11/24/21 1340   haloperidol (HALDOL) tablet 2 mg  2 mg Oral BID Harlow Asa, MD   2 mg at 11/24/21 9892   hydrocerin (EUCERIN) cream   Topical BID Janine Limbo, MD   Given at 11/24/21 1700   hydrOXYzine (ATARAX) tablet 25 mg  25 mg Oral TID PRN Janine Limbo, MD   25 mg at 11/24/21 1047   LORazepam (ATIVAN) tablet 1 mg  1 mg  Oral Q6H PRN Harlow Asa, MD   1 mg at 10/07/21 1546   magnesium hydroxide (MILK OF MAGNESIA) suspension 30 mL  30 mL Oral Daily PRN Janine Limbo, MD   30 mL at 11/14/21 0802   nicotine (NICODERM CQ - dosed in mg/24 hours) patch 14 mg  14 mg Transdermal Daily Nelda Marseille,  E, MD   14 mg at 11/24/21 0810   OLANZapine zydis (ZYPREXA) disintegrating tablet 5 mg  5 mg Oral Q8H PRN Harlow Asa, MD   5 mg at 10/14/21 1416   And   ziprasidone (GEODON) injection 20 mg  20 mg Intramuscular PRN Harlow Asa, MD       ondansetron [facility]) tablet 4 mg  4 mg Oral Q8H PRN Harlow Asa, MD   4 mg at 11/23/21 1194   polyethylene glycol (MIRALAX / GLYCOLAX) packet 17 g  17 g Oral Daily PRN Harlow Asa, MD       Lab Results:  Results for orders placed or performed during the hospital encounter of 09/06/21 (from the past 48 hour(s))  Comprehensive metabolic panel     Status: Abnormal   Collection Time: 11/23/21  6:55 AM  Result Value Ref Range   Sodium 139 135 - 145 mmol/L   Potassium 4.4 3.5 - 5.1 mmol/L   Chloride 107 98 - 111 mmol/L   CO2 23 22 - 32 mmol/L   Glucose, Bld 122 (H) 70 - 99 mg/dL    Comment: Glucose reference range applies only to samples taken after fasting for at least 8 hours.   BUN 14 6 - 20 mg/dL   Creatinine, Ser 1.05 (H) 0.44 - 1.00 mg/dL   Calcium 9.0 8.9 - 10.3 mg/dL   Total Protein 7.7 6.5 - 8.1 g/dL   Albumin 3.6 3.5 - 5.0 g/dL   AST 23 15 - 41 U/L   ALT 16 0 - 44 U/L   Alkaline Phosphatase 57 38 - 126 U/L   Total Bilirubin 0.6 0.3 - 1.2 mg/dL   GFR, Estimated >60 >60 mL/min    Comment: (NOTE) Calculated using the CKD-EPI Creatinine Equation (2021)    Anion gap 9 5 - 15    Comment: Performed at [facility], [place] [address] [place], [place] [address]  Ammonia     Status: None   Collection Time: 11/23/21  6:55 AM  Result Value Ref Range   Ammonia 25 9 - 35 umol/L    Comment: Performed at [facility],  [place] [address] [place], [place] [address]  CBC with Differential/Platelet     Status: None   Collection Time: 11/23/21  6:55 AM  Result Value Ref Range   WBC 8.4 4.0 - 10.5 K/uL   RBC 4.72 3.87 - 5.11 MIL/uL   Hemoglobin 14.2 12.0 - 15.0 g/dL   HCT 42.5 36.0 - 46.0 %   MCV 90.0 80.0 - 100.0 fL   MCH 30.1 26.0 -  34.0 pg   MCHC 33.4 30.0 - 36.0 g/dL   RDW 13.4 11.5 - 15.5 %   Platelets 265 150 - 400 K/uL   nRBC 0.0 0.0 - 0.2 %   Neutrophils Relative % 70 %   Neutro Abs 5.8 1.7 - 7.7 K/uL   Lymphocytes Relative 25 %   Lymphs Abs 2.1 0.7 - 4.0 K/uL   Monocytes Relative 4 %   Monocytes Absolute 0.3 0.1 - 1.0 K/uL   Eosinophils Relative 1 %   Eosinophils Absolute 0.1 0.0 - 0.5 K/uL   Basophils Relative 0 %   Basophils Absolute 0.0 0.0 - 0.1 K/uL   Immature Granulocytes 0 %   Abs Immature Granulocytes 0.02 0.00 - 0.07 K/uL    Comment: Performed at [facility], [place] [address], [place], [place] [address]  Lipase, blood     Status: None   Collection Time: 11/23/21  6:55 AM  Result Value Ref Range   Lipase 35 11 - 51 U/L    Comment: Performed at [facility], [place] [address], [place], [place] [address]      Blood Alcohol level:  Lab Results  Component Value Date   ETH <10 05/39/7673   Metabolic Disorder Labs: Lab Results  Component Value Date   HGBA1C 5.3 09/08/2021   MPG 105.41 09/08/2021   No results found for: "PROLACTIN" Lab Results  Component Value Date   CHOL 173 09/08/2021   TRIG 110 09/08/2021   HDL 50 09/08/2021   CHOLHDL 3.5 09/08/2021   VLDL 22 09/08/2021   LDLCALC 101 (H) 09/08/2021   LDLCALC 102 (H) 06/21/2020   Physical Findings:  AIMS: Facial and Oral Movements Muscles of Facial Expression: None, normal Lips and Perioral Area: None, normal Jaw: None, normal Tongue: None, normal,Extremity Movements Upper (arms, wrists, hands, fingers): None, normal Lower (legs, knees, ankles, toes): None, normal, Trunk  Movements Neck, shoulders, hips: None, normal, Overall Severity Severity of abnormal movements (highest score from questions above): None, normal Incapacitation due to abnormal movements: None, normal Patient's awareness of abnormal movements (rate only patient's report): No Awareness, Dental Status Current problems with teeth and/or dentures?: No Does patient usually wear dentures?: No   Musculoskeletal: Strength & Muscle Tone: within normal limits Gait & Station: normal Patient leans: N/A  Psychiatric Specialty Exam:  Presentation  General Appearance: improved hygiene - wearing clean clothing  Eye Contact:Fair  Speech:rambling but normal fluency  Speech Volume:Normal  Mood and Affect  Mood:described as "fine" - calm   Affect: less anxious appearing, less confused appearing  Thought Process  Thought Processes: concrete, ruminative about discharge planning; tangential but improved processing speed compared to yesterday and following instructions today  Orientation:Oriented to month and date not year, season, or situation - knows Software engineer is Medical illustrator Content: Does not make delusional statements today; is not grossly responding to internal/external stimuli on exam; denies SI, HI, AVH or paranoia and denies ideas of reference or first rank symptoms but is suspicious and ruminative about need for continued admission   Hallucinations:Denied  Ideas of Reference:Denied  Suicidal Thoughts:Denied   Homicidal Thoughts:Denied   Sensorium  Memory:Limited  Judgment:Impaired  Insight:Shallow  Executive Functions  Concentration:Poor  Attention Span:Poor  Recall:untested  Fund of Knowledge:Limited  Language:Fair  Psychomotor Activity  Psychomotor Activity:no restlessness, akathisia or tremors noted; following instructions today better   Assets  Assets:Communication Skills; Social Support; Physical Health  Sleep  7 hours  Physical Exam Vitals and  nursing note reviewed.  Constitutional:      Appearance: Normal appearance.  HENT:     Head: Normocephalic.     Right Ear: External ear normal.     Left Ear: External ear normal.     Nose: Nose normal.     Mouth/Throat:     Mouth: Mucous membranes are moist.     Pharynx: Oropharynx is clear.     Comments: Drooling at times on exam Eyes:     Extraocular Movements: Extraocular movements intact.     Conjunctiva/sclera: Conjunctivae normal.     Pupils: Pupils are equal, round, and reactive to light.  Cardiovascular:     Rate and Rhythm: Normal rate.     Pulses: Normal pulses.  Pulmonary:     Effort: Pulmonary effort is normal.  Abdominal:     Palpations: Abdomen is soft.  Genitourinary:    Comments: deferred Musculoskeletal:        General: Normal range of motion.     Cervical back: Normal range of motion and neck supple.  Skin:    General: Skin is warm.  Neurological:     General: No focal deficit present.     Mental Status: [name] alert.  Psychiatric:        Behavior: Behavior normal.    Review of Systems  Constitutional: Negative.  Negative for chills and fever.  HENT: Negative.  Negative for hearing loss and tinnitus.   Eyes: Negative.  Negative for blurred vision and photophobia.  Respiratory: Negative.  Negative for cough, sputum production, shortness of breath and wheezing.   Cardiovascular: Negative.  Negative for palpitations.  Gastrointestinal: Negative.  Negative for abdominal pain, constipation and diarrhea.       Vomiting and nausea resolved  Genitourinary: Negative.  Negative for dysuria, frequency and urgency.  Musculoskeletal: Negative.  Negative for myalgias and neck pain.  Skin: Negative.  Negative for itching and rash.  Neurological: Negative.  Negative for dizziness and headaches.  Endo/Heme/Allergies: Negative.  Negative for environmental allergies and polydipsia. Does not bruise/bleed easily.       Penicillins Penicillins  Itching Not  Specified Allergy 12/30/2013    Psychiatric/Behavioral: Negative.     Blood pressure 122/81, pulse 95, temperature 98.5 F (36.9 C), temperature source Oral, resp. rate 16, height 5' 6" (1.676 m), weight 72.6 kg, SpO2 100 %. Body mass index is 25.83 kg/m.  Treatment Plan Summary: ASSESSMENT: The patient appears more alert and less disorganized than yesterday. [name] able to follow some simple instructions and despite residual tangential and ruminative thoughts is more goal oriented today. We will continue to monitor on present regimen.  Diagnoses / Active Problems: Bipolar I MRE manic with psychotic features Major Neurocognitive d/o with behavioral disturbance  PLAN: Safety and Monitoring:  -- Voluntary admission (signed in by guardian for treatment on 11/19/21) to inpatient psychiatric unit for safety, stabilization and treatment  -- Daily contact with patient to assess and evaluate symptoms and progress in treatment  -- Patient's case to be discussed in multi-disciplinary team meeting  -- Observation Level : q15 minute checks  -- Vital signs:  q12 hours  -- Precautions: suicide, elopement, and assault  2. Psychiatric Diagnoses and Treatment:   Bipolar I MRE manic with psychotic features (r/o schizoaffective d/o - bipolar type) Major neurocognitive disorder with behavioral disturbance (marked for age brain atrophy on CT head and MOCA 11/30)   -Due to worsening confusion yesterday and more disorganized behaviors, her Haldol was changed back to 75m bid - will continue  to monitor  -Continue Cogentin 0.5 mg bid given drooling   -Invega Sustenna 234 mg IM given on 09-30-21, 151m IM given 10/12/20 - Invega 78 mg given on 7/21, reduced dose given due to CrCl of 54 after consultation with pharmacist regarding monthly maintenance dose and consent obtained from guardian for medication administration -- Zyprexa/Geodon/ativan agitation protocol PRN   --Additional psychosis labs (HIV- , RPR-,  ANA-, heavy metal WNL, ESR wnl, ceruloplasmin wnl and CT head shows Marked for age brain atrophy.  No acute or reversible finding.)   --Depakote, Ativan, and Zyprexa stopped -- ACTT referral pending -- Continue Aricept 555mdaily and attempt to repeat MOCA when she will cooperate for testing - monitoring for GI sx on med -- Interim guardianship in place with mother and waiting on SSDI and medicaid for group home referral  3. Medical Issues Being Addressed:   Tobacco Use Disorder  -- Nicotine patch 1422m4 hours ordered and d/c nicotine gum    Hyperammonemia -resolved --Ammonia level 30 on 10/27/21 and 25 on 8/4 --Depakote was stopped    Mildly elevated creatinine - improving  --Creatinine of 1.05 on 8/4  -- Continue to encourage po fluids    Seasonal allergies --Continue Claritin 9m92mily PRN and d/c Flonase at request of guardian  Constipation - improved -- Monitor for GI side-effects with start of Aricept  -- Continue colace 100mg32m and encouraged po fluids -- PRN Miralax for severe constipation  Nausea/Vomiting - resolved -- No further emesis -- ordered SLP for swallow assessment    4. Discharge Planning:   -- Social work and case management to assist with discharge planning and identification of hospital follow-up needs prior to discharge  -- Discharge Concerns: Need to establish a safety plan; Medication compliance and effectiveness; safe housing   -- Discharge Goals: ALF or group home with outpatient referrals for mental health follow-up including medication management/psychotherapy  Labs On Admission- CMP: WNL except Cr: 1.1,  GFR: 60,  CBC: WNL,  UDS: Neg,  Troponin: 3,  EtOH: Neg,  beta hCG: 6.4,  Resp Panel: Neg 5/29- Urine Preg: Neg,  A1c: 5.3,  TSH: 1.719,  Lipid Panel: WNL except LDL: 101 5/24- BMP: WNL except Cr: 1.01,  Ca: 8.4,  hCG: 5,  Urine Preg: Neg 5/28- Ammonia: 32,  CBC: WNL,  CMP: WNL except  Cr: 1.08,  Ca: 8.6,  Depakote lvl: 20 6/3- Depakote lvl: 64,  Hep Function: WNL,  CBC: WNL except Hem: 11.9 6/7- Depakote lvl: 75 6/18- UA: Neg,  CBC: WNL,  Depakote lvl: 136,  Ammonia: 99,  BMP: WNL except BUN: 22,  Cr: 1.19,  Ca: 8.6,  GFR: 54,  Hep Function: WNL except Albu: 3.3,  Indirect Bili: 0.2 6/20- ANA: Neg,  Ammonia: 48,  Ceruloplasmin: 24.2,  CMP: WNL except BUN: 23,  Cr: 1.35,  Ca: 8.3,  Albu: 3.4  HIV:Neg,  RPR: Neg,  Sed Rate: 6,  Depakote lvl: 25,  Heavy Metal: Neg 6/23- Ammonia: 46,  BMP: WNL except Cr: 1.2,  Ca: 8.3,  GFR: 54 6/24- UA: WNL except Bacteria: rare 6/30- CMP:  WNL except Cr: 1.04,  Ca: 8.6,  CBC: WNL,  Ammonia: 41, EKG: NSR with Qtc: 399 7/8 - CMP WNL other than CR 1.33 and GFR 48; Ammonia 30 7/18 - Cr of 1.22, eGFR of 53 7/23 - EKG with Qtc of 422 7/31 - creatinine 1.08 8/1 - creatinine 1.24 8/3 - COVID negative 8/4 - CBC WNL, NH3 25, CMP WNL other  than creatinine 1.05 and glucose 122, lipase WNL 11/24/21-No recent [name], FNP,  11/24/2021, 5:42 PM Patient ID: Manuella Ghazi, female   DOB: 07-Oct-1965, 56 y.o.   MRN: [id number]

## 2021-11-24 NOTE — Group Note (Signed)
  [facility] LCSW Group Therapy Note  Date/Time:  11/24/2021 10:00am-11:00am  Type of Therapy and Topic:  Group Therapy:  Stability after Discharge  Participation Level:  Minimal   Description of Group This process group involved patients discussing what their overall goal is at this time in their life and how they plan to work toward that goal when they get home from the hospital.  The group started with patients sharing their goal and plans, then proceeded with group members identifying with each other.  A discussion ensued about the differences in healthy and unhealthy coping skills and a variety of specific coping skills that might be helpful in specific instances were shared.  Group members shared ideas about making changes when they return home so that they can stay well and in recovery.  These included boundaries, putting oneself first, staying on medications, talking to a therapist, and more  Therapeutic Goals Patient will identify one overall goal Patient will list current ideas for how to go about achieving their goal Patient will participate in generating ideas about healthy self-care options when they return to the community Patient will be supportive of one another and receive support from others Patient will receive affirmations and hope  Summary of Patient Progress:  The patient expressed her goal is "quite a bit since 5/20, I can go home."  She appeared confused and did not talk at length like she usually does, but quickly just stopped talking as though finished.   Therapeutic Modalities Brief Solution-Focused Therapy Psychoeducation   Ambrose Mantle, LCSW 11/24/2021, 12:00pm

## 2021-11-24 NOTE — Progress Notes (Signed)
   11/24/21 0515  Sleep  Number of Hours 8.75

## 2021-11-24 NOTE — Progress Notes (Signed)
Adult Psychoeducational Group Note  Date:  11/24/2021 Time:  9:04 PM  Group Topic/Focus:  Wrap-Up Group:   The focus of this group is to help patients review their daily goal of treatment and discuss progress on daily workbooks.  Participation Level:  Did Not Attend  Participation Quality:  N/A  Affect:  N/A  Cognitive:  N/A  Insight: N/A  Engagement in Group:  N/A  Modes of Intervention:  N/A  Additional Comments:   Pt was encouraged to attend group discussion but refused   Vevelyn Pat 11/24/2021, 9:04 PM

## 2021-11-24 NOTE — Progress Notes (Signed)
   11/24/21 1000  Psych Admission Type (Psych Patients Only)  Admission Status Involuntary  Psychosocial Assessment  Patient Complaints Suspiciousness  Eye Contact Fair  Facial Expression Anxious  Affect Appropriate to circumstance  Speech Soft  Interaction Cautious;Childlike;Guarded  Motor Activity Slow  Appearance/Hygiene Disheveled  Behavior Characteristics Cooperative;Appropriate to situation  Mood Anxious;Preoccupied;Suspicious  Aggressive Behavior  Effect No apparent injury  Thought Process  Coherency Disorganized;Loose associations  Content Preoccupation  Delusions None reported or observed  Perception WDL  Hallucination None reported or observed  Judgment Impaired  Confusion Mild  Danger to Self  Current suicidal ideation? Denies  Agreement Not to Harm Self Yes  Description of Agreement Verbal  Danger to Others  Danger to Others None reported or observed

## 2021-11-25 ENCOUNTER — Inpatient Hospital Stay (HOSPITAL_COMMUNITY): Payer: Self-pay

## 2021-11-25 DIAGNOSIS — F312 Bipolar disorder, current episode manic severe with psychotic features: Secondary | ICD-10-CM | POA: Diagnosis not present

## 2021-11-25 LAB — SARS CORONAVIRUS 2 BY RT PCR: SARS Coronavirus 2 by RT PCR: NEGATIVE

## 2021-11-25 NOTE — Final Progress Note (Signed)
Adult Psychoeducational Group Note  Date:  11/25/2021 Time:  10:54 PM  Group Topic/Focus:  Wrap-Up Group:   The focus of this group is to help patients review their daily goal of treatment and discuss progress on daily workbooks.  Participation Level:  Minimal  Participation Quality:  Attentive  Affect:  Not Congruent  Cognitive:  Disorganized  Insight: Lacking and Limited  Engagement in Group:  Lacking  Modes of Intervention:  Discussion  Additional Comments:   Pt states that she had a good day because she was able to go to music therapy. Pt denied everything and when asked about ways in which she wants to better herself pt began talking about leaving.   Vevelyn Pat 11/25/2021, 10:54 PM

## 2021-11-25 NOTE — Progress Notes (Signed)
Patient had HR of 117 sitting and 142 bpm while standing this morning. Nurse notified NP about it who asked for EKG to be done and came to assess pt. Patient is alert and orient, in no apparent distress. She denies SOB, palpitation, chest pain, dizziness. EKG obtain, sinus tach HR 105 bpm.

## 2021-11-25 NOTE — Progress Notes (Signed)
   11/25/21 1700  Psych Admission Type (Psych Patients Only)  Admission Status Involuntary  Psychosocial Assessment  Patient Complaints Suspiciousness  Eye Contact Fair  Facial Expression Anxious  Affect Appropriate to circumstance  Speech Soft  Interaction Cautious;Childlike;Guarded  Motor Activity Slow  Appearance/Hygiene Disheveled;Layered clothes  Behavior Characteristics Cooperative;Appropriate to situation  Mood Anxious;Preoccupied  Aggressive Behavior  Effect No apparent injury  Thought Process  Coherency Disorganized;Loose associations  Content Preoccupation  Delusions None reported or observed  Perception WDL  Hallucination None reported or observed  Judgment Impaired  Confusion Mild  Danger to Self  Current suicidal ideation? Denies  Agreement Not to Harm Self Yes  Description of Agreement Verbal  Danger to Others  Danger to Others None reported or observed

## 2021-11-25 NOTE — Group Note (Signed)
  [facility] LCSW Group Therapy Note  Date/Time:  11/25/2021 10:00AM-11:00AM  Type of Therapy and Topic:  Group Therapy:  Ways to Love Myself and Take Care of Myself  Participation Level:  Did Not Attend   Description of Group This process group started with group leader playing a song entitled "Love Me More" to facilitate a discussion about the need to love and respect ourselves and prioritize taking care of ourselves, especially after hospital discharge.   There was a discussion about the need for self-love, and patients listed ways in which they can demonstrate self-love.  Patients were then asked to share how they plan to take care of themselves in a better manner when they get home from the hospital.  Group members shared ideas about making changes when they return home so that they can stay well and in recovery.  Two more songs were played including "I Am Enough" and "My Own Hero" which led to further reinforcement of the ideas given.  Patients were emotional and/or supportive of those who became emotional.  Therapeutic Goals Patient will listen and be able to relate to a song about prioritizing themselves through self-love Patient will participate in generating ideas about healthy self-care options when they return to the community Patients will be supportive of one another and receive said support from others   Summary of Patient Progress:  The patient did not attend group   Therapeutic Modalities Activity Motivational Interviewing Processing   Keeanna Villafranca Grossman-Orr, LCSW 11/25/2021, 12:00pm    

## 2021-11-25 NOTE — Progress Notes (Signed)
   11/25/21 0100  Psych Admission Type (Psych Patients Only)  Admission Status Involuntary  Psychosocial Assessment  Patient Complaints Suspiciousness  Eye Contact Fair  Facial Expression Anxious  Affect Appropriate to circumstance  Speech Soft  Interaction Childlike;Cautious;Guarded  Motor Activity Slow  Appearance/Hygiene Disheveled  Behavior Characteristics Cooperative;Appropriate to situation  Mood Anxious;Preoccupied;Suspicious  Aggressive Behavior  Effect No apparent injury  Thought Process  Coherency Disorganized  Content Preoccupation  Delusions None reported or observed  Perception WDL  Hallucination None reported or observed  Judgment Impaired  Confusion Mild  Danger to Self  Current suicidal ideation? Denies  Agreement Not to Harm Self Yes  Description of Agreement verbal  Danger to Others  Danger to Others None reported or observed

## 2021-11-25 NOTE — Progress Notes (Signed)
Notified by patient's nurse that pt's HR was 117 sitting and 142 bpm while standing. This Clinical research associate presented to unit to assess pt. Patient is alert and orient, in no apparent distress. She denies SOB, palpitation, chest pain, dizziness. Current resting HR 110 bpm, HR up to 138 while ambulating. EKG obtain, sinus tach HR 105 bpm.   BP 123/86 (BP Location: Right Arm)   Pulse (!) 130   Temp 98.6 F (37 C) (Oral)   Resp 16   Ht 5\' 6"  (1.676 m)   Wt 72.6 kg   SpO2 98%   BMI 25.83 kg/m

## 2021-11-25 NOTE — Evaluation (Signed)
Clinical/Bedside Swallow Evaluation Patient Details  Name: CLAY MENSER MRN: [id number] Date of Birth: [birth year]  Today's Date: 11/25/2021 Time: SLP Start Time (ACUTE ONLY): 0818 SLP Stop Time (ACUTE ONLY): 0849 SLP Time Calculation (min) (ACUTE ONLY): 31 min  Past Medical History:  Past Medical History:  Diagnosis Date   Bipolar 1 disorder with moderate mania (HCC) 12/30/2013   Hypertension    Past Surgical History: History reviewed. No pertinent surgical history. HPI:  DASHAWN GOLDA is a 56 y.o. female, homeless, with h/o bipolar d/o who was initially admitted for inpatient psychiatric hospitalization on 09/06/2021 for management of mania, disorganized behaviors/thinking, and delusions. The patient is currently on Hospital Day 80. Head CT complete 10/09/21: Marked for age brain atrophy.  No acute or reversible finding. Nursing notes recent pocketing of food and drooling warranting SLP consult.    Assessment / Plan / Recommendation  Clinical Impression  Patient presents with what appears to be a cognitively based dysphagia. Evaluation limited by patient cooperation. She appeared skeptical of SLP stating "what are you talking about?" to every request made of her. Oral motor exam limited but [facility] for labial and lingual symmetry. No dysarthria present during conversation to indicate focal weakness. SLP did note some labial spillage of secretions on the left during conversation however patient lying on side, leaning to the left for entire evaluation, refusing to sit upright when requested. She was agreeable to consumption of thin liquids, water and soda, via cup sips with normal appearing oropharyngeal swallow, no overt s/s of aspiration. She refused all solids. Patient has notable cognitive deficits including poor awareness and attention which are likely contributing to reports of pocketing of solid boluses during po intake. Further discussion with RN revealed that this has only begun since  patient in room to eat, normally goes to the cafeteria but has not over the past 24-48 hours to to covid exposure. SLP suspects that act of walking to the cafeteria, sitting upright at a table, surrounded by others who are eating, is faciliating improved attention to task and therefore, more efficient po intake. Recommend encouraging patient to at the least, sit upright at edge of bed for all pos until she can return to the cafeteria. Per RN, patients are being tested for covid today to determine return to communal eating. If s/s of dysphagia persist once returning to normal eating environment, SLP can be re consulted. SLP Visit Diagnosis: Dysphagia, unspecified (R13.10)    Aspiration Risk  No limitations    Diet Recommendation Regular;Thin liquid   Liquid Administration via: Cup;Straw Medication Administration: Whole meds with liquid (can crush or place in pureed solid if needed) Supervision: Patient able to self feed Compensations: Small sips/bites (sit upright in char, in cafeteria) Postural Changes: Seated upright at 90 degrees    Other  Recommendations Oral Care Recommendations: Oral care BID    Recommendations for follow up therapy are one component of a multi-disciplinary discharge planning process, led by the attending physician.  Recommendations may be updated based on patient status, additional functional criteria and insurance authorization.    Swallow Study   General HPI: ALIANNA WURSTER is a 56 y.o. female, homeless, with h/o bipolar d/o who was initially admitted for inpatient psychiatric hospitalization on 09/06/2021 for management of mania, disorganized behaviors/thinking, and delusions. The patient is currently on Hospital Day 80. Head CT complete 10/09/21: Marked for age brain atrophy.  No acute or reversible finding. Nursing notes recent pocketing of food and drooling warranting SLP consult. Type  of Study: Bedside Swallow Evaluation Previous Swallow Assessment: none Diet  Prior to this Study: Regular;Thin liquids Temperature Spikes Noted: No Respiratory Status: Room air History of Recent Intubation: No Behavior/Cognition: Alert;Distractible;Requires cueing Oral Cavity Assessment: Within Functional Limits Oral Care Completed by SLP: No Oral Cavity - Dentition: Adequate natural dentition Vision: Functional for self-feeding Self-Feeding Abilities: Able to feed self Patient Positioning:  (patient laying in bed on side, refused to sit up) Baseline Vocal Quality: Normal Volitional Cough: Other (Comment) (would not complete) Volitional Swallow: Able to elicit    Oral/Motor/Sensory Function Overall Oral Motor/Sensory Function: Within functional limits   Ice Chips Ice chips: Not tested   Thin Liquid Thin Liquid: Within functional limits Presentation: Cup;Self Fed    Nectar Thick Nectar Thick Liquid: Not tested   Honey Thick Honey Thick Liquid: Not tested   Puree Puree: Not tested   Solid     Solid: Not tested     Ferdinand Lango MA, CCC-SLP  Cherly Erno Meryl 11/25/2021,9:01 AM

## 2021-11-25 NOTE — Progress Notes (Addendum)
[facility] MD Progress Note  11/25/2021 2:41 PM Natalie Brown  MRN:  [id number]  Subjective: Natalie Brown states, "I am going to the hospital to get my CXR done."  Brief History: Natalie Brown is a 56 y.o. female  who was initially admitted for inpatient psychiatric hospitalization on 09/06/2021 for management of mania, disorganized behaviors/thinking, and delusions. The patient is currently on Hospital Day 80.   Chart Review from last 24 hours:  The patient's chart was reviewed and nursing notes were reviewed. The patient's case was discussed in multidisciplinary team meeting.  She did attend morning therapeutic Milieu and group activities. [name] calm today during assessment but states she is ready to be discharge to [facility]. Per MAR she was compliant with scheduled medications and did receive Vistaril X1 PRN today for anxiety within the past 24 hours.  Patient seen today by speech therapy therapist for recent pocketing of food and drooling.  Her report indicates overall oral motor/sensory functions within functional limits.  Patient's heart rate was observed to be 117 sitting and 142 bpm while standing last night.  EKG was obtained, with results of sinus tachycardia heart rate of 105. VS at 4:19 PM: BP 132/76, P 92, O2 Sat 95%.  Assessment: On assessment today, patient is seen face-to-face and examined on Silt.  She appears calm, alert, however, confused and kept reiterating that she is ready to leave.  Chart reviewed and findings shared with the treatment team and discussed with Dr. Nelda Marseille.  Partial orientation noted.  Able to respond to name and answer simple questions.  Mood less depressed and less anxious.  Patient was scheduled today for chest x-ray for follow-up on elevated heart rate of 117/142 for ? Lung aspiration.  Result of findings and  impressions are indicate below:  FINDINGS: The heart size and mediastinal contours are within normal limits. Both lungs are clear. The visualized  skeletal structures are unremarkable.   IMPRESSION: No acute abnormality of the lungs.    Denies nausea nor vomiting today. She denies AVH, ideas of reference, first rank symptoms, or paranoia. She does not make delusional statements unless prompted with questions and continues to express plans for discharge to live with her sister even though it was explained again that this is not a suitable option. When questioned about where she plans to go after discharge, stated [facility] where my sister lives. Prior to Chest X-Ray procedure, patient was given a clean mask to put on because the previous mask was so stained.  Collateral Information: Patient's mother Natalie Brown at [phone] called regarding sending patient for chest x-ray imaging, speech pathologist report, EKG report, and patient plan of care including Haldol and Aricept.  Patient mother is very confused, all the medications have to be called out and spelled out for Natalie Brown.  She complained that nobody from [facility] has called to discuss patient's plan of care.  Then added that she is receiving information from so many people and she is very confused.  Spent 50 minutes reviewing this information with patient's mother.  She kept complaining that there are too many medications, when patient is only on 7 scheduled medications including Eucerin cream and nicotine patch, these were all explained to patient's mother and she is still confused.  Principal Problem: Bipolar affective disorder, current episode manic with psychotic symptoms ([facility]ie Brown) Diagnosis: Principal Problem:   Bipolar affective disorder, current episode manic with psychotic symptoms (Natalie Brown) Active Problems:   Major neurocognitive disorder (Natalie Brown)  Past Psychiatric History: see H&P  Past Medical History:  Past Medical History:  Diagnosis Date   Bipolar 1 disorder with moderate mania ([place]) 12/30/2013   Hypertension    Family History: see H&P  Family Psychiatric   History: see H&P  Social History:  Homeless; mother is interim guardian   Sleep: Good  Appetite:  Good  Current Medications: Current Facility-Administered Medications  Medication Dose Route Frequency Provider Last Rate Last Admin   acetaminophen (TYLENOL) tablet 650 mg  650 mg Oral Q6H PRN Briant Cedar, MD   650 mg at 11/14/21 0759   alum & mag hydroxide-simeth (MAALOX/MYLANTA) 200-200-20 MG/5ML suspension 30 mL  30 mL Oral Q4H PRN Massengill, [name], MD       benztropine (COGENTIN) tablet 0.5 mg  0.5 mg Oral BID Nelda Marseille, Amy E, MD   0.5 mg at 11/25/21 0817   docusate sodium (COLACE) capsule 100 mg  100 mg Oral BID Nelda Marseille, Amy E, MD   100 mg at 11/25/21 0817   donepezil (ARICEPT) tablet 5 mg  5 mg Oral Daily Nelda Marseille, Amy E, MD   5 mg at 11/25/21 1950   feeding supplement (KATE FARMS STANDARD 1.4) liquid 325 mL  325 mL Oral BID BM Massengill, [name], MD   325 mL at 11/25/21 1342   haloperidol (HALDOL) tablet 2 mg  2 mg Oral BID Harlow Asa, MD   2 mg at 11/25/21 9326   hydrocerin (EUCERIN) cream   Topical BID Janine Limbo, MD   Given at 11/25/21 7124   hydrOXYzine (ATARAX) tablet 25 mg  25 mg Oral TID PRN Janine Limbo, MD   25 mg at 11/24/21 1047   LORazepam (ATIVAN) tablet 1 mg  1 mg Oral Q6H PRN Viann Fish E, MD   1 mg at 10/07/21 1546   magnesium hydroxide (MILK OF MAGNESIA) suspension 30 mL  30 mL Oral Daily PRN Massengill, [name], MD   30 mL at 11/14/21 0802   nicotine (NICODERM CQ - dosed in mg/24 hours) patch 14 mg  14 mg Transdermal Daily Nelda Marseille, Amy E, MD   14 mg at 11/25/21 0650   OLANZapine zydis (ZYPREXA) disintegrating tablet 5 mg  5 mg Oral Q8H PRN Viann Fish E, MD   5 mg at 10/14/21 1416   And   ziprasidone (GEODON) injection 20 mg  20 mg Intramuscular PRN Harlow Asa, MD       ondansetron (ZOFRAN) tablet 4 mg  4 mg Oral Q8H PRN Viann Fish E, MD   4 mg at 11/23/21 5809   polyethylene glycol (MIRALAX / GLYCOLAX) packet 17 g   17 g Oral Daily PRN Harlow Asa, MD       Lab Results:  No results found for this or any previous visit (from the past 77 hour(s)).     Blood Alcohol level:  Lab Results  Component Value Date   ETH <10 98/33/8250   Metabolic Disorder Labs: Lab Results  Component Value Date   HGBA1C 5.3 09/08/2021   MPG 105.41 09/08/2021   No results found for: "PROLACTIN" Lab Results  Component Value Date   CHOL 173 09/08/2021   TRIG 110 09/08/2021   HDL 50 09/08/2021   CHOLHDL 3.5 09/08/2021   VLDL 22 09/08/2021   LDLCALC 101 (H) 09/08/2021   LDLCALC 102 (H) 06/21/2020   Physical Findings:  AIMS: Facial and Oral Movements Muscles of Facial Expression: None, normal Lips and Perioral Area: None, normal Jaw: None, normal Tongue: None,  normal,Extremity Movements Upper (arms, wrists, hands, fingers): None, normal Lower (legs, knees, ankles, toes): None, normal, Trunk Movements Neck, shoulders, hips: None, normal, Overall Severity Severity of abnormal movements (highest score from questions above): None, normal Incapacitation due to abnormal movements: None, normal Patient's awareness of abnormal movements (rate only patient's report): No Awareness, Dental Status Current problems with teeth and/or dentures?: No Does patient usually wear dentures?: No   Musculoskeletal: Strength & Muscle Tone: within normal limits Gait & Station: normal Patient leans: N/A  Psychiatric Specialty Exam:  Presentation  General Appearance: improved hygiene - wearing clean clothing  Eye Contact:Fair  Speech:rambling but normal fluency  Speech Volume:Normal  Mood and Affect  Mood:described as "fine" - calm   Affect: less anxious appearing, less confused appearing  Thought Process  Thought Processes: concrete, ruminative about discharge planning; tangential but improved processing speed compared to yesterday and following instructions today  Orientation:Oriented to month and date not year,  season, or situation - knows Software engineer is Medical illustrator Content: Does not make delusional statements today; is not grossly responding to internal/external stimuli on exam; denies SI, HI, AVH or paranoia and denies ideas of reference or first rank symptoms but is suspicious and ruminative about need for continued admission   Hallucinations:Denied  Ideas of Reference:Denied  Suicidal Thoughts:Denied  Homicidal Thoughts:Denied  Sensorium  Memory:Limited  Judgment:Impaired  Insight:Shallow  Executive Functions  Concentration:Poor  Attention Span:Poor  Recall:untested  Fund of Knowledge:Limited  Language:Fair  Psychomotor Activity  Psychomotor Activity:no restlessness, akathisia or tremors noted; following instructions today better  Assets  Assets:Communication Skills; Social Support; Physical Health  Sleep  7 hours  Physical Exam Vitals and nursing note reviewed.  Constitutional:      Appearance: Normal appearance.  HENT:     Head: Normocephalic.     Right Ear: External ear normal.     Left Ear: External ear normal.     Nose: Nose normal.     Mouth/Throat:     Mouth: Mucous membranes are moist.     Pharynx: Oropharynx is clear.     Comments: Drooling at times on exam Eyes:     Extraocular Movements: Extraocular movements intact.     Conjunctiva/sclera: Conjunctivae normal.     Pupils: Pupils are equal, round, and reactive to light.  Cardiovascular:     Rate and Rhythm: Normal rate.     Pulses: Normal pulses.  Pulmonary:     Effort: Pulmonary effort is normal.  Abdominal:     Palpations: Abdomen is soft.  Genitourinary:    Comments: deferred Musculoskeletal:        General: Normal range of motion.     Cervical back: Normal range of motion and neck supple.  Skin:    General: Skin is warm.  Neurological:     General: No focal deficit present.     Mental Status: [name] alert.  Psychiatric:        Behavior: Behavior normal.    Review of Systems   Constitutional: Negative.  Negative for chills and fever.  HENT: Negative.  Negative for hearing loss and tinnitus.   Eyes: Negative.  Negative for blurred vision and photophobia.  Respiratory: Negative.  Negative for cough, sputum production, shortness of breath and wheezing.   Cardiovascular: Negative.  Negative for palpitations.  Gastrointestinal: Negative.  Negative for abdominal pain, constipation and diarrhea.       Vomiting and nausea resolved  Genitourinary: Negative.  Negative for dysuria, frequency and urgency.  Musculoskeletal: Negative.  Negative for myalgias and neck pain.  Skin: Negative.  Negative for itching and rash.  Neurological: Negative.  Negative for dizziness and headaches.  Endo/Heme/Allergies: Negative.  Negative for environmental allergies and polydipsia. Does not bruise/bleed easily.       Penicillins Penicillins  Itching Not Specified Allergy 12/30/2013    Psychiatric/Behavioral: Negative.     Blood pressure 123/86, pulse (!) 108, temperature 98.6 F (37 C), temperature source Oral, resp. rate 16, height 5' 6"  (1.676 m), weight 72.6 kg, SpO2 98 %. Body mass index is 25.83 kg/m.  Treatment Plan Summary: ASSESSMENT: Diagnoses / Active Problems: Bipolar I MRE manic with psychotic features Major Neurocognitive d/o with behavioral disturbance  PLAN: Safety and Monitoring:  -- Voluntary admission (signed in by guardian for treatment on 11/19/21) to inpatient psychiatric unit for safety, stabilization and treatment  -- Daily contact with patient to assess and evaluate symptoms and progress in treatment  -- Patient's case to be discussed in multi-disciplinary team meeting  -- Observation Level : q15 minute checks  -- Vital signs:  q12 hours  -- Precautions: suicide, elopement, and assault  2. Psychiatric Diagnoses and Treatment:   Bipolar I MRE manic with psychotic features (r/o schizoaffective d/o - bipolar type) Major neurocognitive disorder with behavioral  disturbance (marked for age brain atrophy on CT head and MOCA 11/30)   -Due to worsening confusion yesterday and more disorganized behaviors, her Haldol was changed back to 76m bid - will continue to monitor  -Continue Cogentin 0.5 mg bid given drooling   -Invega Sustenna 234 mg IM given on 09-30-21, 1564mIM given 10/12/20 - Invega 78 mg given on 7/21, reduced dose given due to CrCl of 54 after consultation with pharmacist regarding monthly maintenance dose and consent obtained from guardian for medication administration -- Zyprexa/Geodon/ativan agitation protocol PRN   --Additional psychosis labs (HIV- , RPR-, ANA-, heavy metal WNL, ESR wnl, ceruloplasmin wnl and CT head shows Marked for age brain atrophy.  No acute or reversible finding.)   --Depakote, Ativan, and Zyprexa stopped -- ACTT referral pending -- Continue Aricept 92m40maily and attempt to repeat MOCA when she will cooperate for testing - monitoring for GI sx on med -- Interim guardianship in place with mother and waiting on SSDI and medicaid for group [facility] referral  3. Medical Issues Being Addressed:   Tobacco Use Disorder  -- Nicotine patch 24m1m hours ordered and d/c nicotine gum    Hyperammonemia -resolved --Ammonia level 30 on 10/27/21 and 25 on 8/4 --Depakote was stopped    Mildly elevated creatinine - improving  --Creatinine of 1.05 on 8/4  -- Continue to encourage po fluids    Seasonal allergies --Continue Claritin 10mg92mly PRN and d/c Flonase at request of guardian  Constipation - improved -- Monitor for GI side-effects with start of Aricept  -- Continue colace 100mg 67mand encouraged po fluids -- PRN Miralax for severe constipation  Nausea/Vomiting - resolved -- No further emesis -- ordered SLP for swallow assessment    4. Discharge Planning:   -- Social work and case management to assist with discharge planning and identification of hospital follow-up needs prior to discharge  -- Discharge Concerns: Need  to establish a safety plan; Medication compliance and effectiveness; safe housing   -- Discharge Goals: ALF or group [facility] with outpatient referrals for mental health follow-up including medication management/psychotherapy  Labs On Admission- CMP: WNL except Cr: 1.1,  GFR: 60,  CBC: WNL,  UDS: Neg,  Troponin: 3,  EtOH: Neg,  beta hCG: 6.4,  Resp Panel: Neg 5/29- Urine Preg: Neg,  A1c: 5.3,  TSH: 1.719,  Lipid Panel: WNL except LDL: 101 5/24- BMP: WNL except Cr: 1.01,  Ca: 8.4,  hCG: 5,  Urine Preg: Neg 5/28- Ammonia: 32,  CBC: WNL,  CMP: WNL except  Cr: 1.08,  Ca: 8.6,  Depakote lvl: 20 6/3- Depakote lvl: 64, Hep Function: WNL,  CBC: WNL except Hem: 11.9 6/7- Depakote lvl: 75 6/18- UA: Neg,  CBC: WNL,  Depakote lvl: 136,  Ammonia: 99,  BMP: WNL except BUN: 22,  Cr: 1.19,  Ca: 8.6,  GFR: 54,  Hep Function: WNL except Albu: 3.3,  Indirect Bili: 0.2 6/20- ANA: Neg,  Ammonia: 48,  Ceruloplasmin: 24.2,  CMP: WNL except BUN: 23,  Cr: 1.35,  Ca: 8.3,  Albu: 3.4  HIV:Neg,  RPR: Neg,  Sed Rate: 6,  Depakote lvl: 25,  Heavy Metal: Neg 6/23- Ammonia: 46,  BMP: WNL except Cr: 1.2,  Ca: 8.3,  GFR: 54 6/24- UA: WNL except Bacteria: rare 6/30- CMP:  WNL except Cr: 1.04,  Ca: 8.6,  CBC: WNL,  Ammonia: 41, EKG: NSR with Qtc: 399 7/8 - CMP WNL other than CR 1.33 and GFR 48; Ammonia 30 7/18 - Cr of 1.22, eGFR of 53 7/23 - EKG with Qtc of 422 7/31 - creatinine 1.08 8/1 - creatinine 1.24 8/3 - COVID negative 8/4 - CBC WNL, NH3 25, CMP WNL other than creatinine 1.05 and glucose 122, lipase WNL 11/24/21-No recent Labs 11/25/21-COVID 19 PCR lab obtained. Awaiting result. CXR completed, see result; EKG obtained, see result, SLP in to assess patient, see the note.  Laretta Bolster, FNP,  11/25/2021, 2:41 PM Patient ID: Natalie Brown, female   DOB: 06-15-1965, 56 y.o.   MRN: [id number] Patient ID: Natalie Brown, female   DOB: 02/25/1966, 56 y.o.   MRN: [id number]

## 2021-11-26 ENCOUNTER — Encounter (HOSPITAL_COMMUNITY): Payer: Self-pay

## 2021-11-26 DIAGNOSIS — F312 Bipolar disorder, current episode manic severe with psychotic features: Secondary | ICD-10-CM | POA: Diagnosis not present

## 2021-11-26 MED ORDER — FLUTICASONE PROPIONATE 50 MCG/ACT NA SUSP
1.0000 | Freq: Every day | NASAL | Status: DC | PRN
Start: 2021-11-26 — End: 2021-12-24
  Administered 2021-11-30 – 2021-12-18 (×7): 1 via NASAL
  Filled 2021-11-26: qty 16

## 2021-11-26 NOTE — Progress Notes (Signed)
   11/26/21 0800  Psych Admission Type (Psych Patients Only)  Admission Status Involuntary  Psychosocial Assessment  Patient Complaints Confusion  Eye Contact Fair  Facial Expression Anxious  Affect Appropriate to circumstance  Speech Soft;Slow  Interaction Guarded  Motor Activity Slow  Appearance/Hygiene Unremarkable  Behavior Characteristics Appropriate to situation;Cooperative  Mood Anxious;Preoccupied  Thought Process  Coherency Disorganized  Content Preoccupation  Delusions None reported or observed  Perception WDL  Hallucination None reported or observed  Judgment Limited  Confusion Mild  Danger to Self  Current suicidal ideation? Denies  Agreement Not to Harm Self Yes  Description of Agreement Verbal  Danger to Others  Danger to Others None reported or observed

## 2021-11-26 NOTE — Progress Notes (Signed)
Adult Psychoeducational Group Note  Date:  11/26/2021 Time:  8:55 PM  Group Topic/Focus:  Wrap-Up Group:   The focus of this group is to help patients review their daily goal of treatment and discuss progress on daily workbooks.  Participation Level:  Did Not Attend  Participation Quality:  N/A  Affect:  N/A  Cognitive:  N/A  Insight: N/A  Engagement in Group:  N/A  Modes of Intervention:  N/A  Additional Comments:   Pt was asleep and refused to go to wrap up group when asked  Vevelyn Pat 11/26/2021, 8:55 PM

## 2021-11-26 NOTE — Progress Notes (Signed)
Pt vomited in cafeteria during lunch today, MHT observed pt to have her mouth full and try to swallow at same time.

## 2021-11-26 NOTE — [facility] Group Notes (Signed)
[facility] Group Notes:  (Nursing/MHT/Case Management/Adjunct)  Date:  11/26/2021  Time:  10:53 AM  Type of Therapy:  Psychoeducational Skills  Participation Level:  Did Not Attend   Natalie Brown 11/26/2021, 10:53 AM

## 2021-11-26 NOTE — Group Note (Signed)
LCSW Group Therapy Note   Group Date: 11/26/2021 Start Time: 1300 End Time: 1400   Type of Therapy and Topic:  Group Therapy: Boundaries  Participation Level:  Did Not Attend  Description of Group: This group will address the use of boundaries in their personal lives. Patients will explore why boundaries are important, the difference between healthy and unhealthy boundaries, and negative and postive outcomes of different boundaries and will look at how boundaries can be crossed.  Patients will be encouraged to identify current boundaries in their own lives and identify what kind of boundary is being set. Facilitators will guide patients in utilizing problem-solving interventions to address and correct types boundaries being used and to address when no boundary is being used. Understanding and applying boundaries will be explored and addressed for obtaining and maintaining a balanced life. Patients will be encouraged to explore ways to assertively make their boundaries and needs known to significant others in their lives, using other group members and facilitator for role play, support, and feedback.  Therapeutic Goals:  1.  Patient will identify areas in their life where setting clear boundaries could be  used to improve their life.  2.  Patient will identify signs/triggers that a boundary is not being respected. 3.  Patient will identify two ways to set boundaries in order to achieve balance in  their lives: 4.  Patient will demonstrate ability to communicate their needs and set boundaries  through discussion and/or role plays  Summary of Patient Progress:  Did not attend  Therapeutic Modalities:   Cognitive Behavioral Therapy Solution-Focused Therapy  Beatris Si, LCSW 11/26/2021  2:39 PM

## 2021-11-26 NOTE — Progress Notes (Signed)
[facility] MD Progress Note  11/26/2021 7:41 AM KAM KUSHNIR  MRN:  [id number]  Reason for Admission:  Natalie Brown is a 56 y.o. female  who was initially admitted for inpatient psychiatric hospitalization on 09/06/2021 for management of mania, disorganized behaviors/thinking, and delusions. The patient is currently on Hospital Day 81.   Chart Review from last 24 hours:  The patient's chart was reviewed and nursing notes were reviewed. The patient's case was discussed in multidisciplinary team meeting. Per nursing report, she attended select groups, remained disheveled and confused, but had no acute behavioral issues noted. Per MAR she was compliant with scheduled po medications and did not require PRNs.  Information Obtained Today During Patient Interview: The patient was seen and evaluated on the unit. [name] resting in her room upon approach and is easily awakened. She denies issues with N/V or bowel issues. She states she is eating and drinking well. She denies SI, HI, AVH, paranoia or ideas of reference, and denies first rank symptoms. She does not make delusional statements unless prompted with questions. She continues to endorse belief she speaks more than 6 languages and continues to have no understanding of the reason she is in the hospital. She remains ruminative about desire for discharge. She states her mood is "pretty good" and she does not feel depressed or manic. She denies medication side-effects and is not drooling today. [name] oriented to month, city and Software engineer but cannot tell me the year, date, or reason for admission. She was prompted to straighten her room, to shower, and to change her clothes. She insists she is showering but is resistant for staff to wash her clothing. She reports stuffy nose and request Flonase be restarted. She voices no other physical complaints.   Called Guardian/Mother 8/7: Discussed patient's progress and swallow evaluation and discussed negative CXR.  Mother gives permission for Flonase again. Mother concerned about patient's recent hygiene, and we discussed staff attempts to get her to change and was clothes and to attend to ADLs. Mother states she got a letter and had questions for SW and she was advised to speak to SW. Time given for questions.  Principal Problem: Bipolar affective disorder, current episode manic with psychotic symptoms ([place]) Diagnosis: Principal Problem:   Bipolar affective disorder, current episode manic with psychotic symptoms ([place]) Active Problems:   Major neurocognitive disorder ([place])  Past Psychiatric History: see H&P  Past Medical History:  Past Medical History:  Diagnosis Date   Bipolar 1 disorder with moderate mania ([place]) 12/30/2013   Hypertension    Family History: see H&P  Family Psychiatric  History: see H&P  Social History:  Homeless; mother is interim guardian   Sleep: Good  Appetite:  Good  Current Medications: Current Facility-Administered Medications  Medication Dose Route Frequency Provider Last Rate Last Admin   acetaminophen (TYLENOL) tablet 650 mg  650 mg Oral Q6H PRN Briant Cedar, MD   650 mg at 11/14/21 0759   alum & mag hydroxide-simeth (MAALOX/MYLANTA) 200-200-20 MG/5ML suspension 30 mL  30 mL Oral Q4H PRN Massengill, [name], MD       benztropine (COGENTIN) tablet 0.5 mg  0.5 mg Oral BID Nelda Marseille, Sienna Stonehocker E, MD   0.5 mg at 11/25/21 1659   docusate sodium (COLACE) capsule 100 mg  100 mg Oral BID Nelda Marseille, Avin Gibbons E, MD   100 mg at 11/25/21 1659   donepezil (ARICEPT) tablet 5 mg  5 mg Oral Daily Harlow Asa, MD   5 mg  at 11/25/21 0817   feeding supplement (KATE FARMS STANDARD 1.4) liquid 325 mL  325 mL Oral BID BM Massengill, [name], MD   325 mL at 11/25/21 1342   haloperidol (HALDOL) tablet 2 mg  2 mg Oral BID Harlow Asa, MD   2 mg at 11/25/21 2048   hydrocerin (EUCERIN) cream   Topical BID Janine Limbo, MD   Given at 11/25/21 5784   hydrOXYzine (ATARAX) tablet  25 mg  25 mg Oral TID PRN Janine Limbo, MD   25 mg at 11/24/21 1047   LORazepam (ATIVAN) tablet 1 mg  1 mg Oral Q6H PRN Harlow Asa, MD   1 mg at 10/07/21 1546   magnesium hydroxide (MILK OF MAGNESIA) suspension 30 mL  30 mL Oral Daily PRN Massengill, [name], MD   30 mL at 11/14/21 0802   nicotine (NICODERM CQ - dosed in mg/24 hours) patch 14 mg  14 mg Transdermal Daily Nelda Marseille, Shanin Szymanowski E, MD   14 mg at 11/25/21 0650   OLANZapine zydis (ZYPREXA) disintegrating tablet 5 mg  5 mg Oral Q8H PRN Harlow Asa, MD   5 mg at 10/14/21 1416   And   ziprasidone (GEODON) injection 20 mg  20 mg Intramuscular PRN Harlow Asa, MD       ondansetron [facility]) tablet 4 mg  4 mg Oral Q8H PRN Harlow Asa, MD   4 mg at 11/23/21 6962   polyethylene glycol (MIRALAX / GLYCOLAX) packet 17 g  17 g Oral Daily PRN Harlow Asa, MD       Lab Results:  Results for orders placed or performed during the hospital encounter of 09/06/21 (from the past 48 hour(s))  SARS Coronavirus 2 by RT PCR (hospital order, performed in [facility] hospital lab) *cepheid single result test* Anterior Nasal Swab     Status: None   Collection Time: 11/25/21  8:48 AM   Specimen: Anterior Nasal Swab  Result Value Ref Range   SARS Coronavirus 2 by RT PCR NEGATIVE NEGATIVE    Comment: (NOTE) SARS-CoV-2 target nucleic acids are NOT DETECTED.  The SARS-CoV-2 RNA is generally detectable in upper and lower respiratory specimens during the acute phase of infection. The lowest concentration of SARS-CoV-2 viral copies this assay can detect is 250 copies / mL. A negative result does not preclude SARS-CoV-2 infection and should not be used as the sole basis for treatment or other patient management decisions.  A negative result may occur with improper specimen collection / handling, submission of specimen other than nasopharyngeal swab, presence of viral mutation(s) within the areas targeted by this assay, and inadequate number  of viral copies (<250 copies / mL). A negative result must be combined with clinical observations, patient history, and epidemiological information.  Fact Sheet for Patients:   https://www.patel.info/  Fact Sheet for Healthcare Providers: https://hall.com/  This test is not yet approved or  cleared by the [place] FDA and has been authorized for detection and/or diagnosis of SARS-CoV-2 by FDA under an Emergency Use Authorization (EUA).  This EUA will remain in effect (meaning this test can be used) for the duration of the COVID-19 declaration under Section 564(b)(1) of the Act, 21 U.S.C. section 360bbb-3(b)(1), unless the authorization is terminated or revoked sooner.  Performed at [facility], [place] [address] [place], [place] [address]       Blood Alcohol level:  Lab Results  Component Value Date   [facility] <10 13/24/4010   Metabolic  Disorder Labs: Lab Results  Component Value Date   HGBA1C 5.3 09/08/2021   MPG 105.41 09/08/2021   No results found for: "PROLACTIN" Lab Results  Component Value Date   CHOL 173 09/08/2021   TRIG 110 09/08/2021   HDL 50 09/08/2021   CHOLHDL 3.5 09/08/2021   VLDL 22 09/08/2021   LDLCALC 101 (H) 09/08/2021   LDLCALC 102 (H) 06/21/2020   Physical Findings:  AIMS: Facial and Oral Movements Muscles of Facial Expression: None, normal Lips and Perioral Area: None, normal Jaw: None, normal Tongue: None, normal,Extremity Movements Upper (arms, wrists, hands, fingers): None, normal Lower (legs, knees, ankles, toes): None, normal, Trunk Movements Neck, shoulders, hips: None, normal, Overall Severity Severity of abnormal movements (highest score from questions above): None, normal Incapacitation due to abnormal movements: None, normal Patient's awareness of abnormal movements (rate only patient's report): No Awareness, Dental Status Current problems with teeth and/or dentures?:  No Does patient usually wear dentures?: No   Musculoskeletal: Strength & Muscle Tone:untested in bed Gait & Station: untested in bed Patient leans: N/A  Psychiatric Specialty Exam:  Presentation  General Appearance: unkempt appearing  - wearing hair bonnet, dirty scrub top and layers of clothes  Eye Contact:Fair  Speech:rambling but normal fluency  Speech Volume:Normal  Mood and Affect  Mood:described as "pretty good" - appears calm and euthymic  Affect: calm, polite, confused  Thought Process  Thought Processes: concrete, ruminative about discharge planning; tangential at times   Orientation:Oriented to month, city, state and Software engineer not date or year and not for reason for admission  Thought Content: Does not make delusional statements today unless prompted with questions; is not grossly responding to internal/external stimuli on exam; denies SI, HI, AVH or paranoia and denies ideas of reference or first rank symptoms and appears less suspicious and less guarded; ruminative about need for continued admission   Hallucinations:Denied  Ideas of Reference:Denied  Suicidal Thoughts:Denied   Homicidal Thoughts:Denied   Sensorium  Memory:Poor  Judgment:Impaired  Insight:Shallow  Executive Functions  Concentration:Poor  Attention Span:Poor  Recall:Poor  Fund of Knowledge:Limited  Language:Fair  Psychomotor Activity  Psychomotor Activity:no restlessness, akathisia or tremors noted; following instructions today better   Assets  Assets:Communication Skills; Social Support; Physical Health  Sleep  Total time unrecorded  Physical Exam Vitals and nursing note reviewed.  Constitutional:      Appearance: Normal appearance.  HENT:     Head: Normocephalic.     Mouth/Throat:     Comments: No drooling noted Pulmonary:     Effort: Pulmonary effort is normal.  Neurological:     General: No focal deficit present.     Mental Status: [name] alert.     Review of Systems  Respiratory:  Negative for shortness of breath.   Cardiovascular:  Negative for chest pain.  Gastrointestinal:  Negative for abdominal pain, constipation, diarrhea, nausea and vomiting.  Neurological:  Negative for dizziness and headaches.     Blood pressure 132/76, pulse 95, temperature 98.6 F (37 C), temperature source Oral, resp. rate 16, height _0  (1.676 m), weight 72.6 kg, SpO2 98 %. Body mass index is 25.83 kg/m.  Treatment Plan Summary:  Diagnoses / Active Problems: Bipolar I MRE manic with psychotic features Major Neurocognitive d/o with behavioral disturbance  PLAN: Safety and Monitoring:  -- Voluntary admission (signed in by guardian for treatment on 11/19/21) to inpatient psychiatric unit for safety, stabilization and treatment  -- Daily contact with patient to assess and evaluate symptoms and progress in  treatment  -- Patient's case to be discussed in multi-disciplinary team meeting  -- Observation Level : q15 minute checks  -- Vital signs:  q12 hours  -- Precautions: suicide, elopement, and assault  2. Psychiatric Diagnoses and Treatment:   Bipolar I MRE manic with psychotic features (r/o schizoaffective d/o - bipolar type) Major neurocognitive disorder with behavioral disturbance (marked for age brain atrophy on CT head and MOCA 11/30)   -Continue Haldol 20m bid - (had decompensation with attempt to reduce dose lower) - Continue Cogentin 0.5 mg bid (had drooling with previous attempt at dose reduction)   -Invega Sustenna 234 mg IM given on 09-30-21, 15387mIM given 10/12/20 - Invega 78 mg given on 7/21, reduced dose given due to CrCl of 54 after consultation with pharmacist regarding monthly maintenance dose and consent obtained from guardian for medication administration -- Zyprexa/Geodon/ativan agitation protocol PRN   --Additional psychosis labs (HIV- , RPR-, ANA-, heavy metal WNL, ESR wnl, ceruloplasmin wnl and CT head shows Marked for age  brain atrophy.  No acute or reversible finding.)   --Depakote, Ativan, and Zyprexa stopped -- ACTT referral pending -- Continue Aricept 87m21maily and monitoring for GI sx - could not complete repeat MOCA on 11/23/21 -- Interim guardianship in place with mother and waiting on SSDI and medicaid for group home referral  3. Medical Issues Being Addressed:   Tobacco Use Disorder  -- Nicotine patch 43m16m hours ordered and d/c nicotine gum    Hyperammonemia -resolved --Ammonia level 30 on 10/27/21 and 25 on 8/4 --Depakote was stopped    Mildly elevated creatinine - improving  --Creatinine of 1.05 on 8/4  -- Continue to encourage po fluids    Seasonal allergies --Restart Flonase PRN per guardian approval  Constipation - improved -- Monitor for GI side-effects with use of Aricept  -- Continue colace 100mg57m and encouraged po fluids -- PRN Miralax for severe constipation  Nausea/Vomiting - resolved -- No further emesis -- SLP saw for swallow assessment and feels is cognitively based dysphagia - no limitations continue regular diet and thin liquid -- CXR negative for signs of aspiration on 8/6  Tachycardia - resolved     4. Discharge Planning:   -- Social work and case management to assist with discharge planning and identification of hospital follow-up needs prior to discharge  -- Discharge Concerns: Need to establish a safety plan; Medication compliance and effectiveness; safe housing   -- Discharge Goals: ALF or group home with outpatient referrals for mental health follow-up including medication management/psychotherapy  Labs On Admission- CMP: WNL except Cr: 1.1,  GFR: 60,  CBC: WNL,  UDS: Neg,  Troponin: 3,  EtOH: Neg,  beta hCG: 6.4,  Resp Panel: Neg 5/29- Urine Preg: Neg,  A1c: 5.3,  TSH: 1.719,  Lipid Panel: WNL except LDL: 101 5/24- BMP: WNL except Cr: 1.01,  Ca: 8.4,  hCG: 5,  Urine Preg: Neg 5/28- Ammonia: 32,  CBC: WNL,  CMP: WNL except  Cr: 1.08,  Ca: 8.6,  Depakote lvl:  20 6/3- Depakote lvl: 64, Hep Function: WNL,  CBC: WNL except Hem: 11.9 6/7- Depakote lvl: 75 6/18- UA: Neg,  CBC: WNL,  Depakote lvl: 136,  Ammonia: 99,  BMP: WNL except BUN: 22,  Cr: 1.19,  Ca: 8.6,  GFR: 54,  Hep Function: WNL except Albu: 3.3,  Indirect Bili: 0.2 6/20- ANA: Neg,  Ammonia: 48,  Ceruloplasmin: 24.2,  CMP: WNL except BUN: 23,  Cr: 1.35,  Ca: 8.3,  Albu:  3.4  HIV:Neg,  RPR: Neg,  Sed Rate: 6,  Depakote lvl: 25,  Heavy Metal: Neg 6/23- Ammonia: 46,  BMP: WNL except Cr: 1.2,  Ca: 8.3,  GFR: 54 6/24- UA: WNL except Bacteria: rare 6/30- CMP:  WNL except Cr: 1.04,  Ca: 8.6,  CBC: WNL,  Ammonia: 41, EKG: NSR with Qtc: 399 7/8 - CMP WNL other than CR 1.33 and GFR 48; Ammonia 30 7/18 - Cr of 1.22, eGFR of 53 7/23 - EKG with Qtc of 422 7/31 - creatinine 1.08 8/1 - creatinine 1.24 8/3 - COVID negative 8/4 - CBC WNL, NH3 25, CMP WNL other than creatinine 1.05 and glucose 122, lipase WNL 8/6 - CXR negative and COVID negative  Harlow Asa, MD, FAPA 11/26/2021, 7:41 AM

## 2021-11-26 NOTE — BH IP Treatment Plan (Signed)
Interdisciplinary Treatment and Diagnostic Plan Update  11/26/2021 Time of Session: update Natalie Brown MRN: [id number]  Principal Diagnosis: Bipolar affective disorder, current episode manic with psychotic symptoms (HCC)  Secondary Diagnoses: Principal Problem:   Bipolar affective disorder, current episode manic with psychotic symptoms (HCC) Active Problems:   Major neurocognitive disorder (HCC)   Current Medications:  Current Facility-Administered Medications  Medication Dose Route Frequency Provider Last Rate Last Admin   acetaminophen (TYLENOL) tablet 650 mg  650 mg Oral Q6H PRN Lauro Franklin, MD   650 mg at 11/14/21 0759   alum & mag hydroxide-simeth (MAALOX/MYLANTA) 200-200-20 MG/5ML suspension 30 mL  30 mL Oral Q4H PRN Massengill, Harrold Donath, MD       benztropine (COGENTIN) tablet 0.5 mg  0.5 mg Oral BID Mason Jim, Amy E, MD   0.5 mg at 11/26/21 0347   docusate sodium (COLACE) capsule 100 mg  100 mg Oral BID Bartholomew Crews E, MD   100 mg at 11/26/21 4259   donepezil (ARICEPT) tablet 5 mg  5 mg Oral Daily Bartholomew Crews E, MD   5 mg at 11/26/21 5638   feeding supplement (KATE FARMS STANDARD 1.4) liquid 325 mL  325 mL Oral BID BM Massengill, Harrold Donath, MD   325 mL at 11/26/21 0947   haloperidol (HALDOL) tablet 2 mg  2 mg Oral BID Comer Locket, MD   2 mg at 11/26/21 7564   hydrocerin (EUCERIN) cream   Topical BID Phineas Inches, MD   Given at 11/26/21 3329   hydrOXYzine (ATARAX) tablet 25 mg  25 mg Oral TID PRN Phineas Inches, MD   25 mg at 11/24/21 1047   LORazepam (ATIVAN) tablet 1 mg  1 mg Oral Q6H PRN Bartholomew Crews E, MD   1 mg at 10/07/21 1546   magnesium hydroxide (MILK OF MAGNESIA) suspension 30 mL  30 mL Oral Daily PRN Massengill, Harrold Donath, MD   30 mL at 11/14/21 0802   nicotine (NICODERM CQ - dosed in mg/24 hours) patch 14 mg  14 mg Transdermal Daily Mason Jim, Amy E, MD   14 mg at 11/26/21 0945   OLANZapine zydis (ZYPREXA) disintegrating tablet 5 mg  5 mg Oral  Q8H PRN Bartholomew Crews E, MD   5 mg at 10/14/21 1416   And   ziprasidone (GEODON) injection 20 mg  20 mg Intramuscular PRN Comer Locket, MD       ondansetron (ZOFRAN) tablet 4 mg  4 mg Oral Q8H PRN Bartholomew Crews E, MD   4 mg at 11/23/21 5188   polyethylene glycol (MIRALAX / GLYCOLAX) packet 17 g  17 g Oral Daily PRN Comer Locket, MD       PTA Medications: Medications Prior to Admission  Medication Sig Dispense Refill Last Dose   CLARITIN 10 MG tablet Take 10 mg by mouth daily.      fluticasone (FLONASE) 50 MCG/ACT nasal spray Place 1 spray into both nostrils daily.      pseudoephedrine-guaifenesin (MUCINEX D) 60-600 MG 12 hr tablet Take 1 tablet by mouth every 12 (twelve) hours. 30 tablet 0     Patient Stressors: Health problems   Medication change or noncompliance    Patient Strengths: Capable of independent living  Printmaker for treatment/growth  Supportive family/friends   Treatment Modalities: Medication Management, Group therapy, Case management,  1 to 1 session with clinician, Psychoeducation, Recreational therapy.   Physician Treatment Plan for Primary Diagnosis: Bipolar affective disorder, current episode manic with psychotic symptoms (  HCC) Long Term Goal(s): Improvement in symptoms so as ready for discharge   Short Term Goals: Ability to identify and develop effective coping behaviors will improve Ability to maintain clinical measurements within normal limits will improve Compliance with prescribed medications will improve Ability to identify changes in lifestyle to reduce recurrence of condition will improve Ability to verbalize feelings will improve Ability to disclose and discuss suicidal ideas Ability to demonstrate self-control will improve  Medication Management: Evaluate patient's response, side effects, and tolerance of medication regimen.  Therapeutic Interventions: 1 to 1 sessions, Unit Group sessions and Medication  administration.  Evaluation of Outcomes: Adequate for Discharge  Physician Treatment Plan for Secondary Diagnosis: Principal Problem:   Bipolar affective disorder, current episode manic with psychotic symptoms (HCC) Active Problems:   Major neurocognitive disorder (HCC)  Long Term Goal(s): Improvement in symptoms so as ready for discharge   Short Term Goals: Ability to identify and develop effective coping behaviors will improve Ability to maintain clinical measurements within normal limits will improve Compliance with prescribed medications will improve Ability to identify changes in lifestyle to reduce recurrence of condition will improve Ability to verbalize feelings will improve Ability to disclose and discuss suicidal ideas Ability to demonstrate self-control will improve     Medication Management: Evaluate patient's response, side effects, and tolerance of medication regimen.  Therapeutic Interventions: 1 to 1 sessions, Unit Group sessions and Medication administration.  Evaluation of Outcomes: Adequate for Discharge   RN Treatment Plan for Primary Diagnosis: Bipolar affective disorder, current episode manic with psychotic symptoms (HCC) Long Term Goal(s): Knowledge of disease and therapeutic regimen to maintain health will improve  Short Term Goals: Ability to remain free from injury will improve, Ability to verbalize frustration and anger appropriately will improve, Ability to demonstrate self-control, Ability to participate in decision making will improve, Ability to verbalize feelings will improve, Ability to disclose and discuss suicidal ideas, Ability to identify and develop effective coping behaviors will improve, and Compliance with prescribed medications will improve  Medication Management: RN will administer medications as ordered by provider, will assess and evaluate patient's response and provide education to patient for prescribed medication. RN will report any  adverse and/or side effects to prescribing provider.  Therapeutic Interventions: 1 on 1 counseling sessions, Psychoeducation, Medication administration, Evaluate responses to treatment, Monitor vital signs and CBGs as ordered, Perform/monitor CIWA, COWS, AIMS and Fall Risk screenings as ordered, Perform wound care treatments as ordered.  Evaluation of Outcomes: Adequate for Discharge   LCSW Treatment Plan for Primary Diagnosis: Bipolar affective disorder, current episode manic with psychotic symptoms (HCC) Long Term Goal(s): Safe transition to appropriate next level of care at discharge, Engage patient in therapeutic group addressing interpersonal concerns.  Short Term Goals: Engage patient in aftercare planning with referrals and resources, Increase social support, Increase ability to appropriately verbalize feelings, Increase emotional regulation, Facilitate acceptance of mental health diagnosis and concerns, Facilitate patient progression through stages of change regarding substance use diagnoses and concerns, Identify triggers associated with mental health/substance abuse issues, and Increase skills for wellness and recovery  Therapeutic Interventions: Assess for all discharge needs, 1 to 1 time with Social worker, Explore available resources and support systems, Assess for adequacy in community support network, Educate family and significant other(s) on suicide prevention, Complete Psychosocial Assessment, Interpersonal group therapy.  Evaluation of Outcomes: Adequate for Discharge   Progress in Treatment: Attending groups: Yes. Participating in groups: Yes. Taking medication as prescribed: Yes. Toleration medication: Yes. Family/Significant other contact made: Yes,  individual(s) contacted:  guardian, mother Patient understands diagnosis: Yes. Discussing patient identified problems/goals with staff: Yes. Medical problems stabilized or resolved: No. Patient has signs of dementia Denies  suicidal/homicidal ideation: Yes. Issues/concerns per patient self-inventory: No.  New problem(s) identified: No, Describe:  none reported  New Short Term/Long Term Goal(s):   medication stabilization, elimination of SI thoughts, development of comprehensive mental wellness plan.    Patient Goals:  Patient continues to express goals of discharge.   Discharge Plan or Barriers:  Patient is currently waiting for placement.  Patient awaiting benefits so that placement can be pursued.  Patient guardian has signed her in voluntarily.   Reason for Continuation of Hospitalization: Delusions  Medication stabilization Other; describe Patient has symptoms of demential and not able to care for herself.   Estimated Length of Stay: TBD  Last 3 [place] Suicide Severity Risk Score: Flowsheet Row Ad[facility] (Current) [facility] from [facility]05/2021 in [facility] from [facility]26/[facility] in [facility]  C-SSRS RISK CATEGORY High Risk High Risk No Risk       Last PHQ 2/9 Scores:    05/26/2020    2:14 PM 09/03/2017    4:34 PM 08/07/2016    2:12 PM  Depression screen PHQ 2/9  Decreased Interest 3 3 3   Down, Depressed, Hopeless 3 1 2   PHQ - 2 Score 6 4 5   Altered sleeping 0 3 3  Tired, decreased energy 0 3 0  Change in appetite 3 3 0  Feeling bad or failure about yourself  0 3 1  Trouble concentrating 3 0 0  Moving slowly or fidgety/restless 3 0 1  Suicidal thoughts 0 0 0  PHQ-9 Score 15 16 10     Scribe for Treatment Team: , LCSW 11/26/2021 10:14 AM

## 2021-11-26 NOTE — Progress Notes (Signed)
   11/25/21 2300  Psych Admission Type (Psych Patients Only)  Admission Status Involuntary  Psychosocial Assessment  Patient Complaints Confusion  Eye Contact Fair  Facial Expression Anxious  Affect Appropriate to circumstance  Speech Soft;Slow  Interaction Guarded  Motor Activity Slow  Appearance/Hygiene Unremarkable  Behavior Characteristics Appropriate to situation;Cooperative  Mood Anxious  Thought Process  Coherency Disorganized  Content Preoccupation  Delusions None reported or observed  Perception WDL  Hallucination None reported or observed  Judgment Impaired  Confusion Mild  Danger to Self  Current suicidal ideation? Denies  Agreement Not to Harm Self Yes  Description of Agreement verbal  Danger to Others  Danger to Others None reported or observed

## 2021-11-27 DIAGNOSIS — F312 Bipolar disorder, current episode manic severe with psychotic features: Secondary | ICD-10-CM | POA: Diagnosis not present

## 2021-11-27 NOTE — Progress Notes (Signed)
[facility] LCSW Progress Note   11/27/2021    10:21 AM  Nobie Putnam   [id number]   Type of Contact and Topic:  Care Coordination and Discharge Planning   Patient continues to board due to inadequate funding for placement. CSW attempted again to contact Onnie Boer, [phone]) in order to get update on status on disability application, whether more signatures are required. CSW has been unable to get intouch with him despite multiple attempts over the past week. CSW left another message with Diplomatic Services operational officer for him to Furniture conservator/restorer at direct line. Situation ongoing, CSW will continue to monitor and update note as more information becomes available.   Physician continued recommendation of discharge to 24 hour supervised facility. Shelter not currently a viable option. Family unable to accommodate housing.   Situation ongoing, CSW will continue to monitor and update note as more information becomes available.     Signed:  [name], MSW, LCSWA, LCAS 11/27/2021 10:21 AM

## 2021-11-27 NOTE — Progress Notes (Signed)
   11/26/21 2300  Psych Admission Type (Psych Patients Only)  Admission Status Involuntary  Psychosocial Assessment  Patient Complaints Confusion  Eye Contact Glaring  Facial Expression Anxious  Affect Preoccupied  Speech Soft;Slow  Interaction Guarded  Motor Activity Slow  Appearance/Hygiene Unremarkable  Behavior Characteristics Appropriate to situation  Mood Anxious  Thought Process  Coherency Disorganized  Content Preoccupation  Delusions None reported or observed  Perception WDL  Hallucination None reported or observed  Judgment Limited  Confusion Mild  Danger to Self  Current suicidal ideation? Denies  Agreement Not to Harm Self Yes  Description of Agreement verbal  Danger to Others  Danger to Others None reported or observed

## 2021-11-27 NOTE — [facility] Group Notes (Signed)
Patient attended the Pharmacy group. 

## 2021-11-27 NOTE — Progress Notes (Signed)
Pt denies SI/HI/AVH and verbally agrees to approach staff if these become apparent or before harming themselves/others. Rates depression 0/10. Rates anxiety 0/10. Rates pain 0/10. Pt has been in and out of her room throughout the day. No issues when going to cafeteria. Scheduled medications administered to pt, per MD orders. RN provided support and encouragement to pt. Q15 min safety checks implemented and continued. Pt safe on the unit. RN will continue to monitor and intervene as needed.   11/27/21 0820  Psych Admission Type (Psych Patients Only)  Admission Status Involuntary  Psychosocial Assessment  Patient Complaints Confusion  Eye Contact Fair  Facial Expression Animated;Anxious  Affect Preoccupied  Speech Soft;Slow  Interaction Assertive  Motor Activity Slow  Appearance/Hygiene Unremarkable  Behavior Characteristics Appropriate to situation;Cooperative;Anxious  Mood Anxious;Pleasant  Thought Process  Coherency Disorganized  Content Preoccupation  Delusions None reported or observed  Perception WDL  Hallucination None reported or observed  Judgment Limited  Confusion Mild  Danger to Self  Current suicidal ideation? Denies  Danger to Others  Danger to Others None reported or observed

## 2021-11-27 NOTE — Progress Notes (Signed)
[facility] MD Progress Note  11/27/2021 1:56 PM Natalie Brown  MRN:  [id number]  Reason for Admission:  Natalie Brown is a 56 y.o. female  who was initially admitted for inpatient psychiatric hospitalization on 09/06/2021 for management of mania, disorganized behaviors/thinking, and delusions. The patient is currently on Hospital Day 82.   Chart Review from last 24 hours:  The patient's chart was reviewed and nursing notes were reviewed. The patient's case was discussed in multidisciplinary team meeting. Per nursing report, she attended select groups, remained disheveled and confused, but had no acute behavioral issues noted. Per MAR she was compliant with scheduled po medications and did not require PRNs.  Information Obtained Today During Patient Interview: Patient presents lying down in bed easily awakened, interacting with this provider pleasantly, no agitation or aggression noted, continues to be pleasantly confused telling me "I went to the hospital yesterday" telling me that she went to [facility] yesterday for "my heart and everything but I am doing very well now" I did confirm patient did not leave this facility to another facility, pleasantly confused and disorganized, reports fair sleep and appetite, denies SI HI or AVH.  Denies paranoia or other delusions.  Reports fair sleep and appetite.  Denies depressed mood or feeling elated or anxious.  Does not present manic or depressed.  Placement continues to be pending benefits getting approved.    Principal Problem: Bipolar affective disorder, current episode manic with psychotic symptoms ([place]) Diagnosis: Principal Problem:   Bipolar affective disorder, current episode manic with psychotic symptoms ([place]) Active Problems:   Major neurocognitive disorder ([place])  Past Psychiatric History: see H&P  Past Medical History:  Past Medical History:  Diagnosis Date   Bipolar 1 disorder with moderate mania ([place]) 12/30/2013   Hypertension    Family  History: see H&P  Family Psychiatric  History: see H&P  Social History:  Homeless; mother is interim guardian   Sleep: Good  Appetite:  Good  Current Medications: Current Facility-Administered Medications  Medication Dose Route Frequency Provider Last Rate Last Admin   acetaminophen (TYLENOL) tablet 650 mg  650 mg Oral Q6H PRN Briant Cedar, MD   650 mg at 11/14/21 0759   alum & mag hydroxide-simeth (MAALOX/MYLANTA) 200-200-20 MG/5ML suspension 30 mL  30 mL Oral Q4H PRN Massengill, [name], MD       benztropine (COGENTIN) tablet 0.5 mg  0.5 mg Oral BID Singleton, Amy E, MD   0.5 mg at 11/27/21 0820   docusate sodium (COLACE) capsule 100 mg  100 mg Oral BID Nelda Marseille, Amy E, MD   100 mg at 11/27/21 0820   donepezil (ARICEPT) tablet 5 mg  5 mg Oral Daily Nelda Marseille, Amy E, MD   5 mg at 11/27/21 0820   feeding supplement (KATE FARMS STANDARD 1.4) liquid 325 mL  325 mL Oral BID BM Massengill, Nathan, MD   325 mL at 11/27/21 1006   fluticasone (FLONASE) 50 MCG/ACT nasal spray 1 spray  1 spray Each Nare Daily PRN Nelda Marseille, Amy E, MD       haloperidol (HALDOL) tablet 2 mg  2 mg Oral BID Nelda Marseille, Amy E, MD   2 mg at 11/27/21 0820   hydrocerin (EUCERIN) cream   Topical BID Janine Limbo, MD   Given at 11/26/21 2100   hydrOXYzine (ATARAX) tablet 25 mg  25 mg Oral TID PRN Janine Limbo, MD   25 mg at 11/24/21 1047   LORazepam (ATIVAN) tablet 1 mg  1 mg Oral Q6H  PRN Harlow Asa, MD   1 mg at 10/07/21 1546   magnesium hydroxide (MILK OF MAGNESIA) suspension 30 mL  30 mL Oral Daily PRN Massengill, [name], MD   30 mL at 11/14/21 0802   nicotine (NICODERM CQ - dosed in mg/24 hours) patch 14 mg  14 mg Transdermal Daily Nelda Marseille, Amy E, MD   14 mg at 11/27/21 0822   OLANZapine zydis (ZYPREXA) disintegrating tablet 5 mg  5 mg Oral Q8H PRN Harlow Asa, MD   5 mg at 10/14/21 1416   And   ziprasidone (GEODON) injection 20 mg  20 mg Intramuscular PRN Harlow Asa, MD        ondansetron [facility]) tablet 4 mg  4 mg Oral Q8H PRN Harlow Asa, MD   4 mg at 11/23/21 6629   polyethylene glycol (MIRALAX / GLYCOLAX) packet 17 g  17 g Oral Daily PRN Harlow Asa, MD       Lab Results:  No results found for this or any previous visit (from the past 76 hour(s)).     Blood Alcohol level:  Lab Results  Component Value Date   ETH <10 47/65/4650   Metabolic Disorder Labs: Lab Results  Component Value Date   HGBA1C 5.3 09/08/2021   MPG 105.41 09/08/2021   No results found for: "PROLACTIN" Lab Results  Component Value Date   CHOL 173 09/08/2021   TRIG 110 09/08/2021   HDL 50 09/08/2021   CHOLHDL 3.5 09/08/2021   VLDL 22 09/08/2021   LDLCALC 101 (H) 09/08/2021   LDLCALC 102 (H) 06/21/2020   Physical Findings:  AIMS: Facial and Oral Movements Muscles of Facial Expression: None, normal Lips and Perioral Area: None, normal Jaw: None, normal Tongue: None, normal,Extremity Movements Upper (arms, wrists, hands, fingers): None, normal Lower (legs, knees, ankles, toes): None, normal, Trunk Movements Neck, shoulders, hips: None, normal, Overall Severity Severity of abnormal movements (highest score from questions above): None, normal Incapacitation due to abnormal movements: None, normal Patient's awareness of abnormal movements (rate only patient's report): No Awareness, Dental Status Current problems with teeth and/or dentures?: No Does patient usually wear dentures?: No   Musculoskeletal: Strength & Muscle Tone:untested in bed Gait & Station: untested in bed Patient leans: N/A  Psychiatric Specialty Exam:  Presentation  General Appearance: unkempt appearing  - wearing hair bonnet, dirty scrub top and layers of clothes  Eye Contact:Fair  Speech:rambling but normal fluency  Speech Volume:Normal  Mood and Affect  Mood:described as "pretty good" - appears calm and euthymic  Affect: calm, polite, confused  Thought Process  Thought  Processes: concrete, ruminative about discharge planning; tangential at times   Orientation:Oriented to month, city, state and Software [place] not date or year and not for reason for admission  Thought Content: Does not make delusional statements today unless prompted with questions; is not grossly responding to internal/external stimuli on exam; denies SI, HI, AVH or paranoia and denies ideas of reference or first rank symptoms and appears less suspicious and less guarded; ruminative about need for continued admission   Hallucinations:Denied  Ideas of Reference:Denied  Suicidal Thoughts:Denied   Homicidal Thoughts:Denied   Sensorium  Memory:Poor  Judgment:Impaired  Insight:Shallow  Executive Functions  Concentration:Poor  Attention Span:Poor  Recall:Poor  Fund of Knowledge:Limited  Language:Fair  Psychomotor Activity  Psychomotor Activity:no restlessness, akathisia or tremors noted; following instructions today better   Assets  Assets:Communication Skills; Social Support; Physical Health  Sleep  Total time unrecorded  Physical Exam Vitals and  nursing note reviewed.  Constitutional:      Appearance: Normal appearance.  HENT:     Head: Normocephalic.     Mouth/Throat:     Comments: No drooling noted Pulmonary:     Effort: Pulmonary effort is normal.  Neurological:     General: No focal deficit present.     Mental Status: [name] alert.    Review of Systems  Constitutional: Negative.   Respiratory:  Negative for shortness of breath.   Cardiovascular:  Negative for chest pain.  Gastrointestinal: Negative.  Negative for abdominal pain, constipation, diarrhea, nausea and vomiting.  Genitourinary: Negative.   Neurological: Negative.  Negative for dizziness and headaches.     Blood pressure (!) 120/95, pulse (!) 118, temperature 98.5 F (36.9 C), temperature source Oral, resp. rate 16, height 5' 6"  (1.676 m), weight 72.6 kg, SpO2 100 %. Body mass index is 25.83  kg/m.  Treatment Plan Summary:  Diagnoses / Active Problems: Bipolar I MRE manic with psychotic features Major Neurocognitive d/o with behavioral disturbance  PLAN: Safety and Monitoring:  -- Voluntary admission (signed in by guardian for treatment on 11/19/21) to inpatient psychiatric unit for safety, stabilization and treatment  -- Daily contact with patient to assess and evaluate symptoms and progress in treatment  -- Patient's case to be discussed in multi-disciplinary team meeting  -- Observation Level : q15 minute checks  -- Vital signs:  q12 hours  -- Precautions: suicide, elopement, and assault  2. Psychiatric Diagnoses and Treatment:   Bipolar I MRE manic with psychotic features (r/o schizoaffective d/o - bipolar type) Major neurocognitive disorder with behavioral disturbance (marked for age brain atrophy on CT head and MOCA 11/30)   -Continue Haldol 10m bid - (had decompensation with attempt to reduce dose lower) - Continue Cogentin 0.5 mg bid (had drooling with previous attempt at dose reduction)   -Invega Sustenna 234 mg IM given on 09-30-21, 1526mIM given 10/12/20 - Invega 78 mg given on 7/21, reduced dose given due to CrCl of 54 after consultation with pharmacist regarding monthly maintenance dose and consent obtained from guardian for medication administration -- Zyprexa/Geodon/ativan agitation protocol PRN   --Additional psychosis labs (HIV- , RPR-, ANA-, heavy metal WNL, ESR wnl, ceruloplasmin wnl and CT head shows Marked for age brain atrophy.  No acute or reversible finding.)   --Depakote, Ativan, and Zyprexa stopped -- ACTT referral pending -- Continue Aricept 39m34maily and monitoring for GI sx - could not complete repeat MOCA on 11/23/21 -- Interim guardianship in place with mother and waiting on SSDI and medicaid for group home referral  3. Medical Issues Being Addressed:   Tobacco Use Disorder  -- Nicotine patch 77m20m hours ordered and d/c nicotine  gum    Hyperammonemia -resolved --Ammonia level 30 on 10/27/21 and 25 on 8/4 --Depakote was stopped    Mildly elevated creatinine - improving  --Creatinine of 1.05 on 8/4  -- Continue to encourage po fluids    Seasonal allergies --Restart Flonase PRN per guardian approval  Constipation - improved -- Monitor for GI side-effects with use of Aricept  -- Continue colace 100mg40m and encouraged po fluids -- PRN Miralax for severe constipation  Nausea/Vomiting - resolved -- No further emesis -- SLP saw for swallow assessment and feels is cognitively based dysphagia - no limitations continue regular diet and thin liquid -- CXR negative for signs of aspiration on 8/6  Tachycardia - resolved     4. Discharge Planning:   -- Social  work and case management to assist with discharge planning and identification of hospital follow-up needs prior to discharge  -- Discharge Concerns: Need to establish a safety plan; Medication compliance and effectiveness; safe housing   -- Discharge Goals: ALF or group home with outpatient referrals for mental health follow-up including medication management/psychotherapy  Labs On Admission- CMP: WNL except Cr: 1.1,  GFR: 60,  CBC: WNL,  UDS: Neg,  Troponin: 3,  EtOH: Neg,  beta hCG: 6.4,  Resp Panel: Neg 5/29- Urine Preg: Neg,  A1c: 5.3,  TSH: 1.719,  Lipid Panel: WNL except LDL: 101 5/24- BMP: WNL except Cr: 1.01,  Ca: 8.4,  hCG: 5,  Urine Preg: Neg 5/28- Ammonia: 32,  CBC: WNL,  CMP: WNL except  Cr: 1.08,  Ca: 8.6,  Depakote lvl: 20 6/3- Depakote lvl: 64, Hep Function: WNL,  CBC: WNL except Hem: 11.9 6/7- Depakote lvl: 75 6/18- UA: Neg,  CBC: WNL,  Depakote lvl: 136,  Ammonia: 99,  BMP: WNL except BUN: 22,  Cr: 1.19,  Ca: 8.6,  GFR: 54,  Hep Function: WNL except Albu: 3.3,  Indirect Bili: 0.2 6/20- ANA: Neg,  Ammonia: 48,  Ceruloplasmin: 24.2,  CMP: WNL except BUN: 23,  Cr: 1.35,  Ca: 8.3,  Albu: 3.4  HIV:Neg,  RPR: Neg,  Sed Rate: 6,  Depakote lvl: 25,   Heavy Metal: Neg 6/23- Ammonia: 46,  BMP: WNL except Cr: 1.2,  Ca: 8.3,  GFR: 54 6/24- UA: WNL except Bacteria: rare 6/30- CMP:  WNL except Cr: 1.04,  Ca: 8.6,  CBC: WNL,  Ammonia: 41, EKG: NSR with Qtc: 399 7/8 - CMP WNL other than CR 1.33 and GFR 48; Ammonia 30 7/18 - Cr of 1.22, eGFR of 53 7/23 - EKG with Qtc of 422 7/31 - creatinine 1.08 8/1 - creatinine 1.24 8/3 - COVID negative 8/4 - CBC WNL, NH3 25, CMP WNL other than creatinine 1.05 and glucose 122, lipase WNL 8/6 - CXR negative and COVID negative  Braxson Hollingsworth Winfred Leeds, MD, FAPA 11/27/2021, 1:56 PM

## 2021-11-27 NOTE — Progress Notes (Signed)
   11/27/21 2222  Psych Admission Type (Psych Patients Only)  Admission Status Involuntary  Psychosocial Assessment  Patient Complaints Confusion  Eye Contact Staring  Facial Expression Blank  Affect Inconsistent with thought content  Speech Soft;Slow  Interaction Cautious;Childlike  Motor Activity Slow  Appearance/Hygiene Unremarkable  Behavior Characteristics Anxious  Mood Anxious  Thought Process  Coherency Disorganized  Content Preoccupation  Delusions None reported or observed  Perception WDL  Hallucination None reported or observed  Judgment Poor  Confusion Mild  Danger to Self  Current suicidal ideation? Denies  Agreement Not to Harm Self Yes  Description of Agreement verbal contract  Danger to Others  Danger to Others None reported or observed   D: Patient in dayroom reports she had a good day and is able to engage therapeutically. A: Medications administered as prescribed. Support and encouragement provided as needed.  R: Patient remains safe on the unit. Will continue to monitor for safety and stability.

## 2021-11-28 DIAGNOSIS — F312 Bipolar disorder, current episode manic severe with psychotic features: Secondary | ICD-10-CM | POA: Diagnosis not present

## 2021-11-28 NOTE — Progress Notes (Signed)
[facility] MD Progress Note  11/28/2021 12:42 PM Natalie Brown  MRN:  [id number]  Reason for Admission:  Natalie Brown is a 56 y.o. female  who was initially admitted for inpatient psychiatric hospitalization on 09/06/2021 for management of mania, disorganized behaviors/thinking, and delusions. The patient is currently on Hospital Day 83.   Chart Review from last 24 hours:  The patient's chart was reviewed and nursing notes were reviewed. The patient's case was discussed in multidisciplinary team meeting. Per nursing report, she attended select groups, remained disheveled and confused, but had no acute behavioral issues noted. Per MAR she was compliant with scheduled po medications and did not require PRNs.  Information Obtained Today During Patient Interview: Patient was evaluated in her room, presents pleasant and cooperative denies depressed mood, does not present hyper or manic, does not present responding to stimuli or paranoid but pleasantly confused on and off, preoccupied with wanting to be discharged but redirectable when I inform her that we remain in process of finding her place to be going to safely "all okay I am not familiar with this part of the state but you are I hope you can help me" she denies side effect of medications and reports fair sleep and appetite.  Principal Problem: Bipolar affective disorder, current episode manic with psychotic symptoms ([place]) Diagnosis: Principal Problem:   Bipolar affective disorder, current episode manic with psychotic symptoms ([place]) Active Problems:   Major neurocognitive disorder ([place])  Past Psychiatric History: see H&P  Past Medical History:  Past Medical History:  Diagnosis Date   Bipolar 1 disorder with moderate mania ([place]) 12/30/2013   Hypertension    Family History: see H&P  Family Psychiatric  History: see H&P  Social History:  Homeless; mother is interim guardian   Sleep: Good  Appetite:  Good  Current Medications: Current  Facility-Administered Medications  Medication Dose Route Frequency Provider Last Rate Last Admin   acetaminophen (TYLENOL) tablet 650 mg  650 mg Oral Q6H PRN Briant Cedar, MD   650 mg at 11/14/21 0759   alum & mag hydroxide-simeth (MAALOX/MYLANTA) 200-200-20 MG/5ML suspension 30 mL  30 mL Oral Q4H PRN Massengill, [name], MD       benztropine (COGENTIN) tablet 0.5 mg  0.5 mg Oral BID Nelda Marseille, Amy E, MD   0.5 mg at 11/28/21 0826   docusate sodium (COLACE) capsule 100 mg  100 mg Oral BID Nelda Marseille, Amy E, MD   100 mg at 11/28/21 0826   donepezil (ARICEPT) tablet 5 mg  5 mg Oral Daily Nelda Marseille, Amy E, MD   5 mg at 11/28/21 7867   feeding supplement (KATE FARMS STANDARD 1.4) liquid 325 mL  325 mL Oral BID BM Massengill, Nathan, MD   325 mL at 11/28/21 1000   fluticasone (FLONASE) 50 MCG/ACT nasal spray 1 spray  1 spray Each Nare Daily PRN Nelda Marseille, Amy E, MD       haloperidol (HALDOL) tablet 2 mg  2 mg Oral BID Viann Fish E, MD   2 mg at 11/28/21 6720   hydrocerin (EUCERIN) cream   Topical BID Janine Limbo, MD   Given at 11/28/21 0827   hydrOXYzine (ATARAX) tablet 25 mg  25 mg Oral TID PRN Janine Limbo, MD   25 mg at 11/28/21 0826   LORazepam (ATIVAN) tablet 1 mg  1 mg Oral Q6H PRN Harlow Asa, MD   1 mg at 10/07/21 1546   magnesium hydroxide (MILK OF MAGNESIA) suspension 30 mL  30 mL  Oral Daily PRN Massengill, [name], MD   30 mL at 11/14/21 0802   nicotine (NICODERM CQ - dosed in mg/24 hours) patch 14 mg  14 mg Transdermal Daily Nelda Marseille, Amy E, MD   14 mg at 11/28/21 0829   OLANZapine zydis (ZYPREXA) disintegrating tablet 5 mg  5 mg Oral Q8H PRN Harlow Asa, MD   5 mg at 10/14/21 1416   And   ziprasidone (GEODON) injection 20 mg  20 mg Intramuscular PRN Harlow Asa, MD       ondansetron [facility]) tablet 4 mg  4 mg Oral Q8H PRN Harlow Asa, MD   4 mg at 11/23/21 9038   polyethylene glycol (MIRALAX / GLYCOLAX) packet 17 g  17 g Oral Daily PRN Harlow Asa, MD       Lab Results:  No results found for this or any previous visit (from the past 44 hour(s)).     Blood Alcohol level:  Lab Results  Component Value Date   ETH <10 33/38/3291   Metabolic Disorder Labs: Lab Results  Component Value Date   HGBA1C 5.3 09/08/2021   MPG 105.41 09/08/2021   No results found for: "PROLACTIN" Lab Results  Component Value Date   CHOL 173 09/08/2021   TRIG 110 09/08/2021   HDL 50 09/08/2021   CHOLHDL 3.5 09/08/2021   VLDL 22 09/08/2021   LDLCALC 101 (H) 09/08/2021   LDLCALC 102 (H) 06/21/2020   Physical Findings:  AIMS: Facial and Oral Movements Muscles of Facial Expression: None, normal Lips and Perioral Area: None, normal Jaw: None, normal Tongue: None, normal,Extremity Movements Upper (arms, wrists, hands, fingers): None, normal Lower (legs, knees, ankles, toes): None, normal, Trunk Movements Neck, shoulders, hips: None, normal, Overall Severity Severity of abnormal movements (highest score from questions above): None, normal Incapacitation due to abnormal movements: None, normal Patient's awareness of abnormal movements (rate only patient's report): No Awareness, Dental Status Current problems with teeth and/or dentures?: No Does patient usually wear dentures?: No   Musculoskeletal: Strength & Muscle Tone:untested in bed Gait & Station: untested in bed Patient leans: N/A  Psychiatric Specialty Exam:  Presentation  General Appearance: unkempt appearing  - wearing hair bonnet, dirty scrub top and layers of clothes  Eye Contact:Fair  Speech:rambling but normal fluency  Speech Volume:Normal  Mood and Affect  Mood:described as "pretty good" - appears calm and euthymic  Affect: calm, polite, confused  Thought Process  Thought Processes: concrete, ruminative about discharge planning; tangential at times   Orientation:Oriented to month, city, state and Software engineer not date or year and not for reason for  admission  Thought Content: Does not make delusional statements today unless prompted with questions; is not grossly responding to internal/external stimuli on exam; denies SI, HI, AVH or paranoia and denies ideas of reference or first rank symptoms and appears less suspicious and less guarded; ruminative about need for continued admission   Hallucinations:Denied  Ideas of Reference:Denied  Suicidal Thoughts:Denied   Homicidal Thoughts:Denied   Sensorium  Memory:Poor  Judgment:Impaired  Insight:Shallow  Executive Functions  Concentration:Poor  Attention Span:Poor  Recall:Poor  Fund of Knowledge:Limited  Language:Fair  Psychomotor Activity  Psychomotor Activity:no restlessness, akathisia or tremors noted; following instructions today better   Assets  Assets:Communication Skills; Social Support; Physical Health  Sleep  Total time unrecorded  Physical Exam Vitals and nursing note reviewed.  Constitutional:      Appearance: Normal appearance.  HENT:     Head: Normocephalic.  Mouth/Throat:     Comments: No drooling noted Pulmonary:     Effort: Pulmonary effort is normal.  Neurological:     General: No focal deficit present.     Mental Status: [name] alert.    Review of Systems  Constitutional: Negative.   Respiratory:  Negative for shortness of breath.   Cardiovascular:  Negative for chest pain.  Gastrointestinal: Negative.  Negative for abdominal pain, constipation, diarrhea, nausea and vomiting.  Genitourinary: Negative.   Neurological: Negative.  Negative for dizziness and headaches.     Blood pressure (!) 117/91, pulse (!) 126, temperature 98.5 F (36.9 C), temperature source Oral, resp. rate 16, height 5' 6"  (1.676 m), weight 72.6 kg, SpO2 97 %. Body mass index is 25.83 kg/m.  Treatment Plan Summary:  Diagnoses / Active Problems: Bipolar I MRE manic with psychotic features Major Neurocognitive d/o with behavioral  disturbance  PLAN: Safety and Monitoring:  -- Voluntary admission (signed in by guardian for treatment on 11/19/21) to inpatient psychiatric unit for safety, stabilization and treatment  -- Daily contact with patient to assess and evaluate symptoms and progress in treatment  -- Patient's case to be discussed in multi-disciplinary team meeting  -- Observation Level : q15 minute checks  -- Vital signs:  q12 hours  -- Precautions: suicide, elopement, and assault  2. Psychiatric Diagnoses and Treatment:   Bipolar I MRE manic with psychotic features (r/o schizoaffective d/o - bipolar type) Major neurocognitive disorder with behavioral disturbance (marked for age brain atrophy on CT head and MOCA 11/30)   -Continue Haldol 22m bid - (had decompensation with attempt to reduce dose lower) - Continue Cogentin 0.5 mg bid (had drooling with previous attempt at dose reduction)   -Invega Sustenna 234 mg IM given on 09-30-21, 1546mIM given 10/12/20 - Invega 78 mg given on 7/21, reduced dose given due to CrCl of 54 after consultation with pharmacist regarding monthly maintenance dose and consent obtained from guardian for medication administration -- Zyprexa/Geodon/ativan agitation protocol PRN   --Additional psychosis labs (HIV- , RPR-, ANA-, heavy metal WNL, ESR wnl, ceruloplasmin wnl and CT head shows Marked for age brain atrophy.  No acute or reversible finding.)   --Depakote, Ativan, and Zyprexa stopped -- ACTT referral pending -- Continue Aricept 27m48maily and monitoring for GI sx - could not complete repeat MOCA on 11/23/21 -- Interim guardianship in place with mother and waiting on SSDI and medicaid for group home referral  3. Medical Issues Being Addressed:   Tobacco Use Disorder  -- Nicotine patch 61m31m hours ordered and d/c nicotine gum    Hyperammonemia -resolved --Ammonia level 30 on 10/27/21 and 25 on 8/4 --Depakote was stopped    Mildly elevated creatinine - improving  --Creatinine of  1.05 on 8/4  -- Continue to encourage po fluids    Seasonal allergies --Restart Flonase PRN per guardian approval  Constipation - improved -- Monitor for GI side-effects with use of Aricept  -- Continue colace 100mg21m and encouraged po fluids -- PRN Miralax for severe constipation  Nausea/Vomiting - resolved -- No further emesis -- SLP saw for swallow assessment and feels is cognitively based dysphagia - no limitations continue regular diet and thin liquid -- CXR negative for signs of aspiration on 8/6  Tachycardia - resolved     4. Discharge Planning:   -- Social work and case management to assist with discharge planning and identification of hospital follow-up needs prior to discharge  -- Discharge Concerns: Need to establish  a safety plan; Medication compliance and effectiveness; safe housing   -- Discharge Goals: ALF or group home with outpatient referrals for mental health follow-up including medication management/psychotherapy  Labs On Admission- CMP: WNL except Cr: 1.1,  GFR: 60,  CBC: WNL,  UDS: Neg,  Troponin: 3,  EtOH: Neg,  beta hCG: 6.4,  Resp Panel: Neg 5/29- Urine Preg: Neg,  A1c: 5.3,  TSH: 1.719,  Lipid Panel: WNL except LDL: 101 5/24- BMP: WNL except Cr: 1.01,  Ca: 8.4,  hCG: 5,  Urine Preg: Neg 5/28- Ammonia: 32,  CBC: WNL,  CMP: WNL except  Cr: 1.08,  Ca: 8.6,  Depakote lvl: 20 6/3- Depakote lvl: 64, Hep Function: WNL,  CBC: WNL except Hem: 11.9 6/7- Depakote lvl: 75 6/18- UA: Neg,  CBC: WNL,  Depakote lvl: 136,  Ammonia: 99,  BMP: WNL except BUN: 22,  Cr: 1.19,  Ca: 8.6,  GFR: 54,  Hep Function: WNL except Albu: 3.3,  Indirect Bili: 0.2 6/20- ANA: Neg,  Ammonia: 48,  Ceruloplasmin: 24.2,  CMP: WNL except BUN: 23,  Cr: 1.35,  Ca: 8.3,  Albu: 3.4  HIV:Neg,  RPR: Neg,  Sed Rate: 6,  Depakote lvl: 25,  Heavy Metal: Neg 6/23- Ammonia: 46,  BMP: WNL except Cr: 1.2,  Ca: 8.3,  GFR: 54 6/24- UA: WNL except Bacteria: rare 6/30- CMP:  WNL except Cr: 1.04,  Ca: 8.6,   CBC: WNL,  Ammonia: 41, EKG: NSR with Qtc: 399 7/8 - CMP WNL other than CR 1.33 and GFR 48; Ammonia 30 7/18 - Cr of 1.22, eGFR of 53 7/23 - EKG with Qtc of 422 7/31 - creatinine 1.08 8/1 - creatinine 1.24 8/3 - COVID negative 8/4 - CBC WNL, NH3 25, CMP WNL other than creatinine 1.05 and glucose 122, lipase WNL 8/6 - CXR negative and COVID negative  Zeinab Rodwell Winfred Leeds, MD, FAPA 11/28/2021, 12:42 PM

## 2021-11-28 NOTE — Group Note (Signed)
[facility] LCSW Group Therapy   Type of Therapy and Topic:  Group Therapy:  Wellness   Participation Level: Minimal  Description of Group: This group allows individuals to explore the 6 dimensions of wellness, including spiritual, emotional, intellectual, physical, social, environmental, financial and spiritual. Patients will learn to different ways to practice wellness to improve well-being. Patients also participated in a conversation about what wellness means to them.   Individuals will think about ways in which they currently practice wellness as well as ways they can improve their wellness and new ways to practice wellness.       Therapeutic Goals Patient will verbalize 1 pr 2 we;;mess areas where they are doing well. Patient will identify 2 areas where they would like to improve their wellness.   Patient will provide a definition of what wellness means to them.  Patients will reflect on current hospitalization and primary areas to maintain mental health to prevent re-hospitalization.     Summary of Patient Progress:  Patient minimally participated in group.  Patient states that she feels like she talks to God which helps with spiritual wellness. At times patient was confused about group topic and unable to appropriately participate.        Therapeutic Modalities Cognitive Behavioral Therapy Motivational Interviewing   Natalie Breslin, LCSW, LCAS Clincal Social Worker  [facility]

## 2021-11-28 NOTE — Progress Notes (Signed)
The patient attended the evening group, but was unable to respond to any of the questions. She just sat and stared.

## 2021-11-28 NOTE — Progress Notes (Signed)
   11/28/21 1000  Psych Admission Type (Psych Patients Only)  Admission Status Involuntary  Psychosocial Assessment  Patient Complaints Confusion  Eye Contact Glaring;Staring  Facial Expression Blank;Wide-eyed;Worried  Affect Preoccupied  Museum/gallery curator;Slow  Interaction Assertive;Cautious;Childlike  Motor Activity Slow  Appearance/Hygiene Unremarkable  Behavior Characteristics Appropriate to situation;Anxious  Mood Anxious;Pleasant  Thought Process  Coherency Disorganized  Content Preoccupation  Delusions None reported or observed  Perception WDL  Hallucination None reported or observed  Judgment Poor  Confusion Mild  Danger to Self  Current suicidal ideation? Denies  Agreement Not to Harm Self Yes  Description of Agreement Verbal  Danger to Others  Danger to Others None reported or observed

## 2021-11-28 NOTE — Progress Notes (Signed)
   11/28/21 2100  Psych Admission Type (Psych Patients Only)  Admission Status Involuntary  Psychosocial Assessment  Patient Complaints Confusion  Eye Contact Brief  Facial Expression Blank  Affect Preoccupied  Speech Soft;Slow  Interaction Assertive  Motor Activity Slow  Appearance/Hygiene Unremarkable  Behavior Characteristics Cooperative  Mood Pleasant  Thought Process  Coherency Circumstantial  Content Preoccupation  Delusions None reported or observed  Perception WDL  Hallucination None reported or observed  Judgment Poor  Confusion Mild  Danger to Self  Current suicidal ideation? Denies  Danger to Others  Danger to Others None reported or observed   Progress note   D: Pt seen at med window. Pt denies SI, HI, AVH. Pt rates pain  0/10. Pt rates anxiety  0/10 and depression  0/10. Pt denies any issues. During assessment, pt will stop talking and stare off for a moment and have to be prompted to return to conversation. Pt attending groups and takes medication without incident.  A: Pt provided support and encouragement. Pt given scheduled medication as prescribed. PRNs as appropriate. Q15 min checks for safety.   R: Pt safe on the unit. Will continue to monitor.

## 2021-11-29 DIAGNOSIS — F312 Bipolar disorder, current episode manic severe with psychotic features: Secondary | ICD-10-CM | POA: Diagnosis not present

## 2021-11-29 NOTE — Group Note (Signed)
Occupational Therapy Group Note   Group Topic:Goal Setting  Group Date: 11/29/2021 Start Time: 1400 End Time: 1500 Facilitators: Ted Mcalpine, OT   Group Description: Group encouraged engagement and participation through discussion focused on goal setting. Group members were introduced to goal-setting using the SMART Goal framework, identifying goals as Specific, Measureable, Acheivable, Relevant, and Time-Bound. Group members took time from group to create their own personal goal reflecting the SMART goal template and shared for review by peers and OT.    Therapeutic Goal(s):  Identify at least one goal that fits the SMART framework    Participation Level: Active   Participation Quality: Minimal Cues   Behavior: Alert   Speech/Thought Process: Disorganized and Loose association    Affect/Mood: Stable    Insight: Limited   Judgement: Limited   Individualization: pt was active in their participation of group discussion/activity. New skills identified  Modes of Intervention: Discussion and Education  Patient Response to Interventions:  Attentive   Plan: Continue to engage patient in OT groups 2 - 3x/week.  11/29/2021  Ted Mcalpine, OT Kerrin Champagne, OT

## 2021-11-29 NOTE — Progress Notes (Signed)
   11/29/21 2300  Psych Admission Type (Psych Patients Only)  Admission Status Involuntary  Psychosocial Assessment  Patient Complaints Confusion  Eye Contact Glaring  Facial Expression Blank  Affect Preoccupied  Speech Slow  Interaction Cautious  Motor Activity Slow  Appearance/Hygiene Unremarkable  Behavior Characteristics Appropriate to situation  Mood Pleasant  Thought Process  Coherency Disorganized  Content Preoccupation  Delusions None reported or observed  Perception WDL  Hallucination None reported or observed  Judgment Impaired  Confusion Mild  Danger to Self  Current suicidal ideation? Denies  Danger to Others  Danger to Others None reported or observed    

## 2021-11-29 NOTE — Progress Notes (Addendum)
[facility] MD Progress Note  11/29/2021 12:31 PM Natalie Brown  MRN:  [id number]  Reason for Admission:  Natalie Brown is a 56 y.o. female  who was initially admitted for inpatient psychiatric hospitalization on 09/06/2021 for management of mania, disorganized behaviors/thinking, and delusions. The patient is currently on Hospital Day 84.   Chart Review from last 24 hours:  The patient's chart was reviewed and nursing notes were reviewed. The patient's case was discussed in multidisciplinary team meeting. Per nursing report, she attended select groups, remained disheveled and confused, but had no acute behavioral issues noted. Per MAR she was compliant with scheduled po medications and did not require PRNs.  Information Obtained Today During Patient Interview: Patient was evaluated in her room, presents pleasant and cooperative denies depressed mood, does not present hyper or manic, does not present responding to stimuli or paranoid but pleasantly confused on and off, preoccupied with wanting to be discharged but redirectable when I inform her that we remain in process of finding her place to be going to safely "all okay I am not familiar with this part of the state but you are I hope you can help me" she denies side effect of medications and reports fair sleep and appetite.  Principal Problem: Bipolar affective disorder, current episode manic with psychotic symptoms ([place]) Diagnosis: Principal Problem:   Bipolar affective disorder, current episode manic with psychotic symptoms ([place]) Active Problems:   Major neurocognitive disorder ([place])  Past Psychiatric History: see H&P  Past Medical History:  Past Medical History:  Diagnosis Date   Bipolar 1 disorder with moderate mania ([place]) 12/30/2013   Hypertension    Family History: see H&P  Family Psychiatric  History: see H&P  Social History:  Homeless; mother is interim guardian   Sleep: Good  Appetite:  Good  Current  Medications: Current Facility-Administered Medications  Medication Dose Route Frequency Provider Last Rate Last Admin   acetaminophen (TYLENOL) tablet 650 mg  650 mg Oral Q6H PRN Briant Cedar, MD   650 mg at 11/14/21 0759   alum & mag hydroxide-simeth (MAALOX/MYLANTA) 200-200-20 MG/5ML suspension 30 mL  30 mL Oral Q4H PRN Massengill, [name], MD       benztropine (COGENTIN) tablet 0.5 mg  0.5 mg Oral BID Nelda Marseille, Amy E, MD   0.5 mg at 11/29/21 0815   docusate sodium (COLACE) capsule 100 mg  100 mg Oral BID Nelda Marseille, Amy E, MD   100 mg at 11/29/21 0816   donepezil (ARICEPT) tablet 5 mg  5 mg Oral Daily Nelda Marseille, Amy E, MD   5 mg at 11/29/21 0815   feeding supplement (KATE FARMS STANDARD 1.4) liquid 325 mL  325 mL Oral BID BM Massengill, Nathan, MD   325 mL at 11/28/21 1423   fluticasone (FLONASE) 50 MCG/ACT nasal spray 1 spray  1 spray Each Nare Daily PRN Nelda Marseille, Amy E, MD       haloperidol (HALDOL) tablet 2 mg  2 mg Oral BID Viann Fish E, MD   2 mg at 11/29/21 0815   hydrocerin (EUCERIN) cream   Topical BID Massengill, [name], MD   1 Application at 19/62/22 0815   hydrOXYzine (ATARAX) tablet 25 mg  25 mg Oral TID PRN Janine Limbo, MD   25 mg at 11/28/21 2104   LORazepam (ATIVAN) tablet 1 mg  1 mg Oral Q6H PRN Harlow Asa, MD   1 mg at 10/07/21 1546   magnesium hydroxide (MILK OF MAGNESIA) suspension 30 mL  30  mL Oral Daily PRN Massengill, [name], MD   30 mL at 11/14/21 0802   nicotine (NICODERM CQ - dosed in mg/24 hours) patch 14 mg  14 mg Transdermal Daily Nelda Marseille, Amy E, MD   14 mg at 11/29/21 0815   OLANZapine zydis (ZYPREXA) disintegrating tablet 5 mg  5 mg Oral Q8H PRN Harlow Asa, MD   5 mg at 10/14/21 1416   And   ziprasidone (GEODON) injection 20 mg  20 mg Intramuscular PRN Harlow Asa, MD       ondansetron [facility]) tablet 4 mg  4 mg Oral Q8H PRN Harlow Asa, MD   4 mg at 11/23/21 2094   polyethylene glycol (MIRALAX / GLYCOLAX) packet 17 g   17 g Oral Daily PRN Harlow Asa, MD       Lab Results:  No results found for this or any previous visit (from the past 28 hour(s)).     Blood Alcohol level:  Lab Results  Component Value Date   ETH <10 70/96/2836   Metabolic Disorder Labs: Lab Results  Component Value Date   HGBA1C 5.3 09/08/2021   MPG 105.41 09/08/2021   No results found for: "PROLACTIN" Lab Results  Component Value Date   CHOL 173 09/08/2021   TRIG 110 09/08/2021   HDL 50 09/08/2021   CHOLHDL 3.5 09/08/2021   VLDL 22 09/08/2021   LDLCALC 101 (H) 09/08/2021   LDLCALC 102 (H) 06/21/2020   Physical Findings:  AIMS: Facial and Oral Movements Muscles of Facial Expression: None, normal Lips and Perioral Area: None, normal Jaw: None, normal Tongue: None, normal,Extremity Movements Upper (arms, wrists, hands, fingers): None, normal Lower (legs, knees, ankles, toes): None, normal, Trunk Movements Neck, shoulders, hips: None, normal, Overall Severity Severity of abnormal movements (highest score from questions above): None, normal Incapacitation due to abnormal movements: None, normal Patient's awareness of abnormal movements (rate only patient's report): No Awareness, Dental Status Current problems with teeth and/or dentures?: No Does patient usually wear dentures?: No   Musculoskeletal: Strength & Muscle Tone:untested in bed Gait & Station: untested in bed Patient leans: N/A  Psychiatric Specialty Exam:  Presentation  General Appearance: unkempt appearing  - wearing hair bonnet, dirty scrub top and layers of clothes  Eye Contact:Fair  Speech:rambling but normal fluency  Speech Volume:Normal  Mood and Affect  Mood:described as "pretty good" - appears calm and euthymic  Affect: calm, polite, confused  Thought Process  Thought Processes: concrete, ruminative about discharge planning; tangential at times   Orientation:Oriented to month, city, state and Software [place] not date or year and not  for reason for admission  Thought Content: Does not make delusional statements today unless prompted with questions; is not grossly responding to internal/external stimuli on exam; denies SI, HI, AVH or paranoia and denies ideas of reference or first rank symptoms and appears less suspicious and less guarded; ruminative about need for continued admission   Hallucinations:Denied  Ideas of Reference:Denied  Suicidal Thoughts:Denied   Homicidal Thoughts:Denied   Sensorium  Memory:Poor  Judgment:Impaired  Insight:Shallow  Executive Functions  Concentration:Poor  Attention Span:Poor  Recall:Poor  Fund of Knowledge:Limited  Language:Fair  Psychomotor Activity  Psychomotor Activity:no restlessness, akathisia or tremors noted; following instructions today better   Assets  Assets:Communication Skills; Social Support; Physical Health  Sleep  Total time unrecorded  Physical Exam Vitals and nursing note reviewed.  Constitutional:      Appearance: Normal appearance.  HENT:     Head: Normocephalic.  Mouth/Throat:     Comments: No drooling noted Pulmonary:     Effort: Pulmonary effort is normal.  Neurological:     General: No focal deficit present.     Mental Status: [name] alert.    Review of Systems  Constitutional: Negative.   HENT: Negative.    Eyes: Negative.   Respiratory: Negative.  Negative for shortness of breath.   Cardiovascular: Negative.  Negative for chest pain.  Gastrointestinal: Negative.  Negative for abdominal pain, constipation, diarrhea, nausea and vomiting.  Genitourinary: Negative.   Musculoskeletal: Negative.   Skin: Negative.   Neurological: Negative.  Negative for dizziness and headaches.     Blood pressure 119/89, pulse 100, temperature 98.5 F (36.9 C), temperature source Oral, resp. rate 18, height 5' 6"  (1.676 m), weight 72.6 kg, SpO2 98 %. Body mass index is 25.83 kg/m.  Treatment Plan Summary:  Diagnoses / Active  Problems: Bipolar I MRE manic with psychotic features Major Neurocognitive d/o with behavioral disturbance  PLAN: Safety and Monitoring:  -- Voluntary admission (signed in by guardian for treatment on 11/19/21) to inpatient psychiatric unit for safety, stabilization and treatment  -- Daily contact with patient to assess and evaluate symptoms and progress in treatment  -- Patient's case to be discussed in multi-disciplinary team meeting  -- Observation Level : q15 minute checks  -- Vital signs:  q12 hours  -- Precautions: suicide, elopement, and assault  2. Psychiatric Diagnoses and Treatment:   Bipolar I MRE manic with psychotic features (r/o schizoaffective d/o - bipolar type) Major neurocognitive disorder with behavioral disturbance (marked for age brain atrophy on CT head and MOCA 11/30)   -Continue Haldol 76m bid - (had decompensation with attempt to reduce dose lower) - Continue Cogentin 0.5 mg bid (had drooling with previous attempt at dose reduction)   -Invega Sustenna 234 mg IM given on 09-30-21, 156mIM given 10/12/20 - Invega 78 mg given on 7/21, reduced dose given due to CrCl of 54 after consultation with pharmacist regarding monthly maintenance dose and consent obtained from guardian for medication administration -- Zyprexa/Geodon/ativan agitation protocol PRN   --Additional psychosis labs (HIV- , RPR-, ANA-, heavy metal WNL, ESR wnl, ceruloplasmin wnl and CT head shows Marked for age brain atrophy.  No acute or reversible finding.)   --Depakote, Ativan, and Zyprexa stopped -- ACTT referral pending -- Continue Aricept 73m39maily and monitoring for GI sx - could not complete repeat MOCA on 11/23/21 -- Interim guardianship in place with mother and waiting on SSDI and medicaid for group home referral  3. Medical Issues Being Addressed:   Tobacco Use Disorder  -- Nicotine patch 33m12m hours ordered and d/c nicotine gum    Hyperammonemia -resolved --Ammonia level 30 on 10/27/21 and  25 on 8/4 --Depakote was stopped    Mildly elevated creatinine - improving  --Creatinine of 1.05 on 8/4  -- Continue to encourage po fluids    Seasonal allergies --Restart Flonase PRN per guardian approval  Constipation - improved -- Monitor for GI side-effects with use of Aricept  -- Continue colace 100mg273m and encouraged po fluids -- PRN Miralax for severe constipation  Nausea/Vomiting - resolved -- No further emesis -- SLP saw for swallow assessment and feels is cognitively based dysphagia - no limitations continue regular diet and thin liquid -- CXR negative for signs of aspiration on 8/6  Tachycardia - resolved     4. Discharge Planning:   -- Social work and case management to assist with discharge  planning and identification of hospital follow-up needs prior to discharge  -- Discharge Concerns: Need to establish a safety plan; Medication compliance and effectiveness; safe housing   -- Discharge Goals: ALF or group home with outpatient referrals for mental health follow-up including medication management/psychotherapy  Labs On Admission- CMP: WNL except Cr: 1.1,  GFR: 60,  CBC: WNL,  UDS: Neg,  Troponin: 3,  EtOH: Neg,  beta hCG: 6.4,  Resp Panel: Neg 5/29- Urine Preg: Neg,  A1c: 5.3,  TSH: 1.719,  Lipid Panel: WNL except LDL: 101 5/24- BMP: WNL except Cr: 1.01,  Ca: 8.4,  hCG: 5,  Urine Preg: Neg 5/28- Ammonia: 32,  CBC: WNL,  CMP: WNL except  Cr: 1.08,  Ca: 8.6,  Depakote lvl: 20 6/3- Depakote lvl: 64, Hep Function: WNL,  CBC: WNL except Hem: 11.9 6/7- Depakote lvl: 75 6/18- UA: Neg,  CBC: WNL,  Depakote lvl: 136,  Ammonia: 99,  BMP: WNL except BUN: 22,  Cr: 1.19,  Ca: 8.6,  GFR: 54,  Hep Function: WNL except Albu: 3.3,  Indirect Bili: 0.2 6/20- ANA: Neg,  Ammonia: 48,  Ceruloplasmin: 24.2,  CMP: WNL except BUN: 23,  Cr: 1.35,  Ca: 8.3,  Albu: 3.4  HIV:Neg,  RPR: Neg,  Sed Rate: 6,  Depakote lvl: 25,  Heavy Metal: Neg 6/23- Ammonia: 46,  BMP: WNL except Cr: 1.2,  Ca:  8.3,  GFR: 54 6/24- UA: WNL except Bacteria: rare 6/30- CMP:  WNL except Cr: 1.04,  Ca: 8.6,  CBC: WNL,  Ammonia: 41, EKG: NSR with Qtc: 399 7/8 - CMP WNL other than CR 1.33 and GFR 48; Ammonia 30 7/18 - Cr of 1.22, eGFR of 53 7/23 - EKG with Qtc of 422 7/31 - creatinine 1.08 8/1 - creatinine 1.24 8/3 - COVID negative 8/4 - CBC WNL, NH3 25, CMP WNL other than creatinine 1.05 and glucose 122, lipase WNL 8/6 - CXR negative and COVID negative  Kennette Cuthrell Winfred Leeds, MD 11/29/2021, 12:31 PM

## 2021-11-29 NOTE — Progress Notes (Signed)
Psychoeducational Group Note  Date:  11/29/2021 Time:  2041  Group Topic/Focus:  Wrap-Up Group:   The focus of this group is to help patients review their daily goal of treatment and discuss progress on daily workbooks.  Participation Level: Did Not Attend  Participation Quality:  Not Applicable  Affect:  Not Applicable  Cognitive:  Not Applicable  Insight:  Not Applicable  Engagement in Group: Not Applicable  Additional Comments:  The patient did not attend group this evening.   Hazle Coca S 11/29/2021, 8:41 PM

## 2021-11-29 NOTE — Progress Notes (Signed)
   11/29/21 1100  Psych Admission Type (Psych Patients Only)  Admission Status Involuntary  Psychosocial Assessment  Patient Complaints Confusion  Eye Contact Brief  Facial Expression Blank  Affect Preoccupied  Speech Soft  Interaction Assertive  Motor Activity Slow  Appearance/Hygiene Unremarkable  Behavior Characteristics Cooperative  Mood Pleasant  Thought Process  Coherency Circumstantial  Content Preoccupation  Delusions None reported or observed  Perception WDL  Hallucination None reported or observed  Judgment Poor  Confusion Mild  Danger to Self  Current suicidal ideation? Denies  Danger to Others  Danger to Others None reported or observed

## 2021-11-30 ENCOUNTER — Encounter (HOSPITAL_COMMUNITY): Payer: Self-pay

## 2021-11-30 DIAGNOSIS — F312 Bipolar disorder, current episode manic severe with psychotic features: Secondary | ICD-10-CM | POA: Diagnosis not present

## 2021-11-30 NOTE — Progress Notes (Signed)
Adult Psychoeducational Group Note  Date:  11/30/2021 Time:  8:55 PM  Group Topic/Focus:  Wrap-Up Group:   The focus of this group is to help patients review their daily goal of treatment and discuss progress on daily workbooks.  Participation Level:  Active  Participation Quality:  Appropriate  Affect:  Appropriate  Cognitive:  Appropriate  Insight: Appropriate  Engagement in Group:  Engaged  Modes of Intervention:  Discussion  Additional Comments:  Pt stated her goal for the day was to go to all meals, and talk to doctor.  Pt met goal.  ,  D 11/30/2021, 8:55 PM 

## 2021-11-30 NOTE — Group Note (Signed)
LCSW Aftercare Discharge Planning Group Note   Type of Group and Topic: Psychoeducational Group: Discharge Planning  Participation Level: Minimal  Description of Group  Discharge planning group reviews patient's anticipated discharge plans and assists patients to anticipate and address any barriers to wellness/recovery in the community. Suicide prevention education is reviewed with patients in group.  Therapeutic Goals  1. Patients will state their anticipated discharge plan and mental health aftercare  2. Patients will identify potential barriers to wellness in the community setting  3. Patients will engage in problem solving, solution focused discussion of ways to anticipate and address barriers to wellness/recovery  Summary of Patient Progress: Patient participated in group.  Patient had poor insight into group topic but stated she would like to engage her sister and mom more in her treatment upon discharge.  Patient was redirected many times throughout group.    Therapeutic Modalities:  Motivational Interviewing   Misael Mcgaha, LCSW, LCAS Clincal Social Worker  [facility]

## 2021-11-30 NOTE — Progress Notes (Signed)
SPIRITUALITY GROUP NOTE  Spirituality group facilitated by Wilkie Aye, MDiv, BCC.  Group Description: Group focused on topic of hope. Patients participated in facilitated discussion around topic, connecting with one another around experiences and definitions for hope. Group members engaged with visual explorer photos, reflecting on what hope looks like for them today. Group engaged in discussion around how their definitions of hope are present today in hospital.  Modalities: Psycho-social ed, Adlerian, Narrative, MI  Patient Progress: Present throughout group.  Engaged with facilitator.  Predominantly on topic - noting that her faith practice of prayer keeps her connected to hope.

## 2021-11-30 NOTE — Progress Notes (Signed)
   11/29/21 2300  Psych Admission Type (Psych Patients Only)  Admission Status Involuntary  Psychosocial Assessment  Patient Complaints Confusion  Eye Contact Glaring  Facial Expression Blank  Affect Preoccupied  Speech Slow  Interaction Cautious  Motor Activity Slow  Appearance/Hygiene Unremarkable  Behavior Characteristics Appropriate to situation  Mood Pleasant  Thought Process  Coherency Disorganized  Content Preoccupation  Delusions None reported or observed  Perception WDL  Hallucination None reported or observed  Judgment Impaired  Confusion Mild  Danger to Self  Current suicidal ideation? Denies  Danger to Others  Danger to Others None reported or observed

## 2021-11-30 NOTE — BH IP Treatment Plan (Signed)
Interdisciplinary Treatment and Diagnostic Plan Update  11/30/2021 Time of Session: 0830 MAIZE BRITTINGHAM MRN: [id number]  Principal Diagnosis: Bipolar affective disorder, current episode manic with psychotic symptoms (HCC)  Secondary Diagnoses: Principal Problem:   Bipolar affective disorder, current episode manic with psychotic symptoms (HCC) Active Problems:   Major neurocognitive disorder (HCC)   Current Medications:  Current Facility-Administered Medications  Medication Dose Route Frequency Provider Last Rate Last Admin   acetaminophen (TYLENOL) tablet 650 mg  650 mg Oral Q6H PRN Lauro Franklin, MD   650 mg at 11/14/21 0759   alum & mag hydroxide-simeth (MAALOX/MYLANTA) 200-200-20 MG/5ML suspension 30 mL  30 mL Oral Q4H PRN Massengill, Harrold Donath, MD       benztropine (COGENTIN) tablet 0.5 mg  0.5 mg Oral BID Mason Jim, Amy E, MD   0.5 mg at 11/30/21 0819   docusate sodium (COLACE) capsule 100 mg  100 mg Oral BID Mason Jim, Amy E, MD   100 mg at 11/30/21 0820   donepezil (ARICEPT) tablet 5 mg  5 mg Oral Daily Mason Jim, Amy E, MD   5 mg at 11/30/21 0819   feeding supplement (KATE FARMS STANDARD 1.4) liquid 325 mL  325 mL Oral BID BM Massengill, Harrold Donath, MD   325 mL at 11/28/21 1423   fluticasone (FLONASE) 50 MCG/ACT nasal spray 1 spray  1 spray Each Nare Daily PRN Mason Jim, Amy E, MD       haloperidol (HALDOL) tablet 2 mg  2 mg Oral BID Comer Locket, MD   2 mg at 11/30/21 0865   hydrocerin (EUCERIN) cream   Topical BID Phineas Inches, MD   Given at 11/30/21 0820   hydrOXYzine (ATARAX) tablet 25 mg  25 mg Oral TID PRN Phineas Inches, MD   25 mg at 11/28/21 2104   LORazepam (ATIVAN) tablet 1 mg  1 mg Oral Q6H PRN Comer Locket, MD   1 mg at 10/07/21 1546   magnesium hydroxide (MILK OF MAGNESIA) suspension 30 mL  30 mL Oral Daily PRN Massengill, Harrold Donath, MD   30 mL at 11/14/21 0802   nicotine (NICODERM CQ - dosed in mg/24 hours) patch 14 mg  14 mg Transdermal Daily  Mason Jim, Amy E, MD   14 mg at 11/30/21 0832   OLANZapine zydis (ZYPREXA) disintegrating tablet 5 mg  5 mg Oral Q8H PRN Bartholomew Crews E, MD   5 mg at 10/14/21 1416   And   ziprasidone (GEODON) injection 20 mg  20 mg Intramuscular PRN Comer Locket, MD       ondansetron (ZOFRAN) tablet 4 mg  4 mg Oral Q8H PRN Bartholomew Crews E, MD   4 mg at 11/23/21 7846   polyethylene glycol (MIRALAX / GLYCOLAX) packet 17 g  17 g Oral Daily PRN Comer Locket, MD       PTA Medications: Medications Prior to Admission  Medication Sig Dispense Refill Last Dose   CLARITIN 10 MG tablet Take 10 mg by mouth daily.      fluticasone (FLONASE) 50 MCG/ACT nasal spray Place 1 spray into both nostrils daily.      pseudoephedrine-guaifenesin (MUCINEX D) 60-600 MG 12 hr tablet Take 1 tablet by mouth every 12 (twelve) hours. 30 tablet 0     Patient Stressors: Health problems   Medication change or noncompliance    Patient Strengths: Capable of independent living  Printmaker for treatment/growth  Supportive family/friends   Treatment Modalities: Medication Management, Group therapy, Case management,  1  to 1 session with clinician, Psychoeducation, Recreational therapy.   Physician Treatment Plan for Primary Diagnosis: Bipolar affective disorder, current episode manic with psychotic symptoms (HCC) Long Term Goal(s): Improvement in symptoms so as ready for discharge   Short Term Goals: Ability to identify and develop effective coping behaviors will improve Ability to maintain clinical measurements within normal limits will improve Compliance with prescribed medications will improve Ability to identify changes in lifestyle to reduce recurrence of condition will improve Ability to verbalize feelings will improve Ability to disclose and discuss suicidal ideas Ability to demonstrate self-control will improve  Medication Management: Evaluate patient's response, side effects, and tolerance of  medication regimen.  Therapeutic Interventions: 1 to 1 sessions, Unit Group sessions and Medication administration.  Evaluation of Outcomes: Adequate for Discharge  Physician Treatment Plan for Secondary Diagnosis: Principal Problem:   Bipolar affective disorder, current episode manic with psychotic symptoms (HCC) Active Problems:   Major neurocognitive disorder (HCC)  Long Term Goal(s): Improvement in symptoms so as ready for discharge   Short Term Goals: Ability to identify and develop effective coping behaviors will improve Ability to maintain clinical measurements within normal limits will improve Compliance with prescribed medications will improve Ability to identify changes in lifestyle to reduce recurrence of condition will improve Ability to verbalize feelings will improve Ability to disclose and discuss suicidal ideas Ability to demonstrate self-control will improve     Medication Management: Evaluate patient's response, side effects, and tolerance of medication regimen.  Therapeutic Interventions: 1 to 1 sessions, Unit Group sessions and Medication administration.  Evaluation of Outcomes: Adequate for Discharge   RN Treatment Plan for Primary Diagnosis: Bipolar affective disorder, current episode manic with psychotic symptoms (HCC) Long Term Goal(s): Knowledge of disease and therapeutic regimen to maintain health will improve  Short Term Goals: Ability to remain free from injury will improve, Ability to verbalize frustration and anger appropriately will improve, Ability to demonstrate self-control, Ability to participate in decision making will improve, Ability to verbalize feelings will improve, Ability to disclose and discuss suicidal ideas, Ability to identify and develop effective coping behaviors will improve, and Compliance with prescribed medications will improve  Medication Management: RN will administer medications as ordered by provider, will assess and evaluate  patient's response and provide education to patient for prescribed medication. RN will report any adverse and/or side effects to prescribing provider.  Therapeutic Interventions: 1 on 1 counseling sessions, Psychoeducation, Medication administration, Evaluate responses to treatment, Monitor vital signs and CBGs as ordered, Perform/monitor CIWA, COWS, AIMS and Fall Risk screenings as ordered, Perform wound care treatments as ordered.  Evaluation of Outcomes: Adequate for Discharge   LCSW Treatment Plan for Primary Diagnosis: Bipolar affective disorder, current episode manic with psychotic symptoms (HCC) Long Term Goal(s): Safe transition to appropriate next level of care at discharge, Engage patient in therapeutic group addressing interpersonal concerns.  Short Term Goals: Engage patient in aftercare planning with referrals and resources, Increase social support, Increase ability to appropriately verbalize feelings, Increase emotional regulation, Facilitate acceptance of mental health diagnosis and concerns, Facilitate patient progression through stages of change regarding substance use diagnoses and concerns, Identify triggers associated with mental health/substance abuse issues, and Increase skills for wellness and recovery  Therapeutic Interventions: Assess for all discharge needs, 1 to 1 time with Social worker, Explore available resources and support systems, Assess for adequacy in community support network, Educate family and significant other(s) on suicide prevention, Complete Psychosocial Assessment, Interpersonal group therapy.  Evaluation of Outcomes: Adequate for Discharge  Progress in Treatment: Attending groups: Yes. Participating in groups: Yes. Taking medication as prescribed: Yes. Toleration medication: Yes. Family/Significant other contact made: Yes, individual(s) contacted:  SPE completed w/ patient's legal guardian/mother [name]. Patient understands diagnosis:  No. Discussing patient identified problems/goals with staff: Yes. Medical problems stabilized or resolved: Yes. Denies suicidal/homicidal ideation: Yes. Issues/concerns per patient self-inventory: Yes. Other: none  New problem(s) identified: No, Describe:  none  New Short Term/Long Term Goal(s): Patient to work towards elimination of symptoms of psychosis, medication management for mood stabilization;  development of comprehensive mental wellness plan.  Patient Goals:  No additional goals identified at this time. Patient to continue to work towards original goals identified in initial treatment team meeting. CSW will remain available to patient should they voice additional treatment goals.   Discharge Plan or Barriers: Patient continues to lack adequate discharge location. CSW continues to monitor patient disability status in order to get patient placed at group home.   Reason for Continuation of Hospitalization: Other; describe psychosis.   Estimated Length of Stay:1-7 days   Last 3 [place] Suicide Severity Risk Score: Flowsheet Row Ad[facility] (Current) [facility] from [facility]/05/2021 in [facility] ED from [facility]/26/2020 in [facility]  C-SSRS RISK CATEGORY High Risk High Risk No Risk       Last PHQ 2/9 Scores:    05/26/2020    2:14 PM 09/03/2017    4:34 PM 08/07/2016    2:12 PM  Depression screen PHQ 2/9  Decreased Interest 3 3 3   Down, Depressed, Hopeless 3 1 2   PHQ - 2 Score 6 4 5   Altered sleeping 0 3 3  Tired, decreased energy 0 3 0  Change in appetite 3 3 0  Feeling bad or failure about yourself  0 3 1  Trouble concentrating 3 0 0  Moving slowly or fidgety/restless 3 0 1  Suicidal thoughts 0 0 0  PHQ-9 Score 15 16 10     Scribe for Treatment Team: 11/30/2021 3:06 PM

## 2021-11-30 NOTE — Progress Notes (Signed)
[facility] MD Progress Note  11/30/2021 12:08 PM Natalie Brown  MRN:  [id number]  Reason for Admission:  Natalie Brown is a 56 y.o. female  who was initially admitted for inpatient psychiatric hospitalization on 09/06/2021 for management of mania, disorganized behaviors/thinking, and delusions. The patient is currently on Hospital Day 85.   Chart Review from last 24 hours:  The patient's chart was reviewed and nursing notes were reviewed. The patient's case was discussed in multidisciplinary team meeting. Per nursing report, she attended select groups, remained disheveled and confused, but had no acute behavioral issues noted. Per MAR she was compliant with scheduled po medications and did not require PRNs.  Information Obtained Today During Patient Interview: Patient was evaluated in her room, continues to present pleasant and cooperative denying any depressed mood does not present hyper or manic, does not present responding to stimuli or paranoid or delusional but continues to be confused at baseline consistent with dementia diagnosis stable with no worsening.  Denies side effect of medications except for staff reporting her to be drooling at times but it was not noted during my evaluation despite the fact that I had her take off her mask during evaluation.  She does not display any sign of EPS at this time.  Currently on Haldol 2 mg twice daily as attempt to taper down dosing caused worsening psychosis, also on Invega sustain injection monthly, to avoid being on 2 antipsychotics I recommend to discontinue [place] and continue with Haldol oral, titrate up if needed, consider Haldol decannulate to replace Invega LAI.  Principal Problem: Bipolar affective disorder, current episode manic with psychotic symptoms ([place]) Diagnosis: Principal Problem:   Bipolar affective disorder, current episode manic with psychotic symptoms ([place]) Active Problems:   Major neurocognitive disorder ([place])  Past  Psychiatric History: see H&P  Past Medical History:  Past Medical History:  Diagnosis Date   Bipolar 1 disorder with moderate mania ([place]) 12/30/2013   Hypertension    Family History: see H&P  Family Psychiatric  History: see H&P  Social History:  Homeless; mother is interim guardian   Sleep: Good  Appetite:  Good  Current Medications: Current Facility-Administered Medications  Medication Dose Route Frequency Provider Last Rate Last Admin   acetaminophen (TYLENOL) tablet 650 mg  650 mg Oral Q6H PRN Briant Cedar, MD   650 mg at 11/14/21 0759   alum & mag hydroxide-simeth (MAALOX/MYLANTA) 200-200-20 MG/5ML suspension 30 mL  30 mL Oral Q4H PRN Massengill, [name], MD       benztropine (COGENTIN) tablet 0.5 mg  0.5 mg Oral BID Nelda Marseille, Amy E, MD   0.5 mg at 11/30/21 0819   docusate sodium (COLACE) capsule 100 mg  100 mg Oral BID Nelda Marseille, Amy E, MD   100 mg at 11/30/21 0820   donepezil (ARICEPT) tablet 5 mg  5 mg Oral Daily Nelda Marseille, Amy E, MD   5 mg at 11/30/21 0819   feeding supplement (KATE FARMS STANDARD 1.4) liquid 325 mL  325 mL Oral BID BM Massengill, Nathan, MD   325 mL at 11/28/21 1423   fluticasone (FLONASE) 50 MCG/ACT nasal spray 1 spray  1 spray Each Nare Daily PRN Nelda Marseille, Amy E, MD       haloperidol (HALDOL) tablet 2 mg  2 mg Oral BID Harlow Asa, MD   2 mg at 11/30/21 3903   hydrocerin (EUCERIN) cream   Topical BID Janine Limbo, MD   Given at 11/30/21 0820   hydrOXYzine (ATARAX) tablet  25 mg  25 mg Oral TID PRN Janine Limbo, MD   25 mg at 11/28/21 2104   LORazepam (ATIVAN) tablet 1 mg  1 mg Oral Q6H PRN Harlow Asa, MD   1 mg at 10/07/21 1546   magnesium hydroxide (MILK OF MAGNESIA) suspension 30 mL  30 mL Oral Daily PRN Massengill, [name], MD   30 mL at 11/14/21 0802   nicotine (NICODERM CQ - dosed in mg/24 hours) patch 14 mg  14 mg Transdermal Daily Nelda Marseille, Amy E, MD   14 mg at 11/30/21 0832   OLANZapine zydis (ZYPREXA)  disintegrating tablet 5 mg  5 mg Oral Q8H PRN Harlow Asa, MD   5 mg at 10/14/21 1416   And   ziprasidone (GEODON) injection 20 mg  20 mg Intramuscular PRN Harlow Asa, MD       ondansetron [facility]) tablet 4 mg  4 mg Oral Q8H PRN Harlow Asa, MD   4 mg at 11/23/21 5188   polyethylene glycol (MIRALAX / GLYCOLAX) packet 17 g  17 g Oral Daily PRN Harlow Asa, MD       Lab Results:  No results found for this or any previous visit (from the past 76 hour(s)).     Blood Alcohol level:  Lab Results  Component Value Date   ETH <10 41/66/0630   Metabolic Disorder Labs: Lab Results  Component Value Date   HGBA1C 5.3 09/08/2021   MPG 105.41 09/08/2021   No results found for: "PROLACTIN" Lab Results  Component Value Date   CHOL 173 09/08/2021   TRIG 110 09/08/2021   HDL 50 09/08/2021   CHOLHDL 3.5 09/08/2021   VLDL 22 09/08/2021   LDLCALC 101 (H) 09/08/2021   LDLCALC 102 (H) 06/21/2020   Physical Findings:  AIMS: Facial and Oral Movements Muscles of Facial Expression: None, normal Lips and Perioral Area: None, normal Jaw: None, normal Tongue: None, normal,Extremity Movements Upper (arms, wrists, hands, fingers): None, normal Lower (legs, knees, ankles, toes): None, normal, Trunk Movements Neck, shoulders, hips: None, normal, Overall Severity Severity of abnormal movements (highest score from questions above): None, normal Incapacitation due to abnormal movements: None, normal Patient's awareness of abnormal movements (rate only patient's report): No Awareness, Dental Status Current problems with teeth and/or dentures?: No Does patient usually wear dentures?: No   Musculoskeletal: Strength & Muscle Tone:untested in bed Gait & Station: untested in bed Patient leans: N/A  Psychiatric Specialty Exam:  Presentation  General Appearance: unkempt appearing  - wearing hair bonnet, dirty scrub top and layers of clothes  Eye Contact:Fair  Speech:rambling but  normal fluency  Speech Volume:Normal  Mood and Affect  Mood:described as "pretty good" - appears calm and euthymic  Affect: calm, polite, confused  Thought Process  Thought Processes: concrete, ruminative about discharge planning; tangential at times   Orientation:Oriented to month, city, state and Software engineer not date or year and not for reason for admission  Thought Content: Does not make delusional statements today unless prompted with questions; is not grossly responding to internal/external stimuli on exam; denies SI, HI, AVH or paranoia and denies ideas of reference or first rank symptoms and appears less suspicious and less guarded; ruminative about need for continued admission   Hallucinations:Denied  Ideas of Reference:Denied  Suicidal Thoughts:Denied   Homicidal Thoughts:Denied   Sensorium  Memory:Poor  Judgment:Impaired  Insight:Shallow  Executive Functions  Concentration:Poor  Attention Span:Poor  Recall:Poor  Fund of Knowledge:Limited  Language:Fair  Psychomotor Activity  Psychomotor Activity:no  restlessness, akathisia or tremors noted; following instructions today better   Assets  Assets:Communication Skills; Social Support; Physical Health  Sleep  Total time unrecorded  Physical Exam Vitals and nursing note reviewed.  Constitutional:      Appearance: Normal appearance.  HENT:     Head: Normocephalic.     Mouth/Throat:     Comments: No drooling noted Pulmonary:     Effort: Pulmonary effort is normal.  Neurological:     General: No focal deficit present.     Mental Status: [name] alert.    Review of Systems  Constitutional: Negative.   HENT: Negative.    Eyes: Negative.   Respiratory: Negative.  Negative for shortness of breath.   Cardiovascular: Negative.  Negative for chest pain.  Gastrointestinal: Negative.  Negative for abdominal pain, constipation, diarrhea, nausea and vomiting.  Genitourinary: Negative.   Musculoskeletal:  Negative.   Skin: Negative.   Neurological: Negative.  Negative for dizziness and headaches.     Blood pressure 130/87, pulse (!) 126, temperature 98.8 F (37.1 C), temperature source Oral, resp. rate 18, height 5' 6"  (1.676 m), weight 72.6 kg, SpO2 98 %. Body mass index is 25.83 kg/m.  Treatment Plan Summary:  Diagnoses / Active Problems: Bipolar I MRE manic with psychotic features Major Neurocognitive d/o with behavioral disturbance  PLAN: Safety and Monitoring:  -- Voluntary admission (signed in by guardian for treatment on 11/19/21) to inpatient psychiatric unit for safety, stabilization and treatment  -- Daily contact with patient to assess and evaluate symptoms and progress in treatment  -- Patient's case to be discussed in multi-disciplinary team meeting  -- Observation Level : q15 minute checks  -- Vital signs:  q12 hours  -- Precautions: suicide, elopement, and assault  2. Psychiatric Diagnoses and Treatment:   Bipolar I MRE manic with psychotic features (r/o schizoaffective d/o - bipolar type) Major neurocognitive disorder with behavioral disturbance (marked for age brain atrophy on CT head and MOCA 11/30)   -Continue Haldol 253m bid - (had decompensation with attempt to reduce dose lower) - Continue Cogentin 0.5 mg bid (had drooling with previous attempt at dose reduction)   -Invega Sustenna 234 mg IM given on 09-30-21, 1557mIM given 10/12/20 - Invega 78 mg given on 7/21, reduced dose given due to CrCl of 54 after consultation with pharmacist regarding monthly maintenance dose and consent obtained from guardian for medication administration  Currently on Haldol 2 mg twice daily as attempt to taper down dosing caused worsening psychosis, also on Invega sustain injection monthly, to avoid being on 2 antipsychotics I recommend to discontinue [place] continue with Haldol oral, titrate up if needed, consider Haldol decannulate to replace Invega LAI.  --  Zyprexa/Geodon/ativan agitation protocol PRN   --Additional psychosis labs (HIV- , RPR-, ANA-, heavy metal WNL, ESR wnl, ceruloplasmin wnl and CT head shows Marked for age brain atrophy.  No acute or reversible finding.)   --Depakote, Ativan, and Zyprexa stopped -- ACTT referral pending -- Continue Aricept 53m47maily and monitoring for GI sx - could not complete repeat MOCA on 11/23/21 -- Interim guardianship in place with mother and waiting on SSDI and medicaid for group home referral  3. Medical Issues Being Addressed:   Tobacco Use Disorder  -- Nicotine patch 61m31m hours ordered and d/c nicotine gum    Hyperammonemia -resolved --Ammonia level 30 on 10/27/21 and 25 on 8/4 --Depakote was stopped    Mildly elevated creatinine - improving  --Creatinine of 1.05 on 8/4  --  Continue to encourage po fluids    Seasonal allergies --Restart Flonase PRN per guardian approval  Constipation - improved -- Monitor for GI side-effects with use of Aricept  -- Continue colace 197m bid and encouraged po fluids -- PRN Miralax for severe constipation  Nausea/Vomiting - resolved -- No further emesis -- SLP saw for swallow assessment and feels is cognitively based dysphagia - no limitations continue regular diet and thin liquid -- CXR negative for signs of aspiration on 8/6  Tachycardia - resolved     4. Discharge Planning:   -- Social work and case management to assist with discharge planning and identification of hospital follow-up needs prior to discharge  -- Discharge Concerns: Need to establish a safety plan; Medication compliance and effectiveness; safe housing   -- Discharge Goals: ALF or group home with outpatient referrals for mental health follow-up including medication management/psychotherapy  Labs On Admission- CMP: WNL except Cr: 1.1,  GFR: 60,  CBC: WNL,  UDS: Neg,  Troponin: 3,  EtOH: Neg,  beta hCG: 6.4,  Resp Panel: Neg 5/29- Urine Preg: Neg,  A1c: 5.3,  TSH: 1.719,  Lipid Panel:  WNL except LDL: 101 5/24- BMP: WNL except Cr: 1.01,  Ca: 8.4,  hCG: 5,  Urine Preg: Neg 5/28- Ammonia: 32,  CBC: WNL,  CMP: WNL except  Cr: 1.08,  Ca: 8.6,  Depakote lvl: 20 6/3- Depakote lvl: 64, Hep Function: WNL,  CBC: WNL except Hem: 11.9 6/7- Depakote lvl: 75 6/18- UA: Neg,  CBC: WNL,  Depakote lvl: 136,  Ammonia: 99,  BMP: WNL except BUN: 22,  Cr: 1.19,  Ca: 8.6,  GFR: 54,  Hep Function: WNL except Albu: 3.3,  Indirect Bili: 0.2 6/20- ANA: Neg,  Ammonia: 48,  Ceruloplasmin: 24.2,  CMP: WNL except BUN: 23,  Cr: 1.35,  Ca: 8.3,  Albu: 3.4  HIV:Neg,  RPR: Neg,  Sed Rate: 6,  Depakote lvl: 25,  Heavy Metal: Neg 6/23- Ammonia: 46,  BMP: WNL except Cr: 1.2,  Ca: 8.3,  GFR: 54 6/24- UA: WNL except Bacteria: rare 6/30- CMP:  WNL except Cr: 1.04,  Ca: 8.6,  CBC: WNL,  Ammonia: 41, EKG: NSR with Qtc: 399 7/8 - CMP WNL other than CR 1.33 and GFR 48; Ammonia 30 7/18 - Cr of 1.22, eGFR of 53 7/23 - EKG with Qtc of 422 7/31 - creatinine 1.08 8/1 - creatinine 1.24 8/3 - COVID negative 8/4 - CBC WNL, NH3 25, CMP WNL other than creatinine 1.05 and glucose 122, lipase WNL 8/6 - CXR negative and COVID negative  Kodah Maret AWinfred Leeds MD 11/30/2021, 12:08 PM

## 2021-11-30 NOTE — Progress Notes (Signed)
Pt was encouraged but didn't attend orientation/goals group. ?

## 2021-11-30 NOTE — Progress Notes (Signed)
   11/30/21 2134  Psych Admission Type (Psych Patients Only)  Admission Status Involuntary  Psychosocial Assessment  Patient Complaints Worrying  Eye Contact Staring  Facial Expression Blank  Affect Preoccupied  Speech Slow  Interaction Cautious  Motor Activity Slow  Appearance/Hygiene Unremarkable  Behavior Characteristics Cooperative  Mood Preoccupied  Thought Process  Content WDL  Delusions None reported or observed  Perception WDL  Hallucination None reported or observed  Judgment Poor  Confusion Mild  Danger to Self  Current suicidal ideation? Denies  Agreement Not to Harm Self Yes  Danger to Others  Danger to Others None reported or observed   D: Patient in dayroom watching a movie appears calm and cooperative.  A: Medications administered as prescribed. Support and encouragement provided as needed.  R: Patient remains safe on the unit. Plan of care ongoing. Pt expresses no other needs at this time.

## 2021-12-01 DIAGNOSIS — F312 Bipolar disorder, current episode manic severe with psychotic features: Secondary | ICD-10-CM | POA: Diagnosis not present

## 2021-12-01 NOTE — Progress Notes (Signed)
[facility] MD Progress Note  12/01/2021 12:35 PM Natalie Brown  MRN:  [id number]  Reason for Admission:  Natalie Brown is a 56 y.o. female  who was initially admitted for inpatient psychiatric hospitalization on 09/06/2021 for management of mania, disorganized behaviors/thinking, and delusions. The patient is currently on Hospital Day 86.   Chart Review from last 24 hours:  The patient's chart was reviewed and nursing notes were reviewed. The patient's case was discussed in multidisciplinary team meeting. Per nursing report, she attended select groups, remained disheveled and pleasantly confused, but had no acute behavioral issues noted. Per MAR she was compliant with scheduled po medications and did not require PRNs.  Information Obtained Today During Patient Interview: Patient was evaluated in her room, continues to present pleasant and cooperative, continues to be confused at baseline, denying any depressed mood does not present hyper or manic, does not present responding to stimuli or paranoid or delusional but continues to be confused at baseline consistent with dementia diagnosis stable with no worsening.  Denies side effect of medications and she does not appear to be drooling during evaluation.  She does not display any sign of EPS at this time.   Principal Problem: Bipolar affective disorder, current episode manic with psychotic symptoms ([place]) Diagnosis: Principal Problem:   Bipolar affective disorder, current episode manic with psychotic symptoms ([place]) Active Problems:   Major neurocognitive disorder ([place])  Past Psychiatric History: see H&P  Past Medical History:  Past Medical History:  Diagnosis Date   Bipolar 1 disorder with moderate mania ([place]) 12/30/2013   Hypertension    Family History: see H&P  Family Psychiatric  History: see H&P  Social History:  Homeless; mother is interim guardian   Sleep: Good  Appetite:  Good  Current Medications: Current  Facility-Administered Medications  Medication Dose Route Frequency Provider Last Rate Last Admin   acetaminophen (TYLENOL) tablet 650 mg  650 mg Oral Q6H PRN Briant Cedar, MD   650 mg at 11/14/21 0759   alum & mag hydroxide-simeth (MAALOX/MYLANTA) 200-200-20 MG/5ML suspension 30 mL  30 mL Oral Q4H PRN Massengill, [name], MD       benztropine (COGENTIN) tablet 0.5 mg  0.5 mg Oral BID Nelda Marseille, Amy E, MD   0.5 mg at 12/01/21 0804   docusate sodium (COLACE) capsule 100 mg  100 mg Oral BID Nelda Marseille, Amy E, MD   100 mg at 12/01/21 0804   donepezil (ARICEPT) tablet 5 mg  5 mg Oral Daily Nelda Marseille, Amy E, MD   5 mg at 12/01/21 0804   feeding supplement (KATE FARMS STANDARD 1.4) liquid 325 mL  325 mL Oral BID BM Massengill, [name], MD   325 mL at 12/01/21 0953   fluticasone (FLONASE) 50 MCG/ACT nasal spray 1 spray  1 spray Each Nare Daily PRN Harlow Asa, MD   1 spray at 11/30/21 2031   haloperidol (HALDOL) tablet 2 mg  2 mg Oral BID Harlow Asa, MD   2 mg at 12/01/21 9528   hydrocerin (EUCERIN) cream   Topical BID Janine Limbo, MD   Given at 12/01/21 0804   hydrOXYzine (ATARAX) tablet 25 mg  25 mg Oral TID PRN Janine Limbo, MD   25 mg at 11/30/21 2158   LORazepam (ATIVAN) tablet 1 mg  1 mg Oral Q6H PRN Harlow Asa, MD   1 mg at 10/07/21 1546   magnesium hydroxide (MILK OF MAGNESIA) suspension 30 mL  30 mL Oral Daily PRN Janine Limbo, MD  30 mL at 11/14/21 0802   nicotine (NICODERM CQ - dosed in mg/24 hours) patch 14 mg  14 mg Transdermal Daily Nelda Marseille, Amy E, MD   14 mg at 11/30/21 0832   OLANZapine zydis (ZYPREXA) disintegrating tablet 5 mg  5 mg Oral Q8H PRN Harlow Asa, MD   5 mg at 10/14/21 1416   And   ziprasidone (GEODON) injection 20 mg  20 mg Intramuscular PRN Harlow Asa, MD       ondansetron [facility]) tablet 4 mg  4 mg Oral Q8H PRN Harlow Asa, MD   4 mg at 11/23/21 4825   polyethylene glycol (MIRALAX / GLYCOLAX) packet 17 g  17 g  Oral Daily PRN Harlow Asa, MD       Lab Results:  No results found for this or any previous visit (from the past 32 hour(s)).     Blood Alcohol level:  Lab Results  Component Value Date   ETH <10 00/37/0488   Metabolic Disorder Labs: Lab Results  Component Value Date   HGBA1C 5.3 09/08/2021   MPG 105.41 09/08/2021   No results found for: "PROLACTIN" Lab Results  Component Value Date   CHOL 173 09/08/2021   TRIG 110 09/08/2021   HDL 50 09/08/2021   CHOLHDL 3.5 09/08/2021   VLDL 22 09/08/2021   LDLCALC 101 (H) 09/08/2021   LDLCALC 102 (H) 06/21/2020   Physical Findings:  AIMS: Facial and Oral Movements Muscles of Facial Expression: None, normal Lips and Perioral Area: None, normal Jaw: None, normal Tongue: None, normal,Extremity Movements Upper (arms, wrists, hands, fingers): None, normal Lower (legs, knees, ankles, toes): None, normal, Trunk Movements Neck, shoulders, hips: None, normal, Overall Severity Severity of abnormal movements (highest score from questions above): None, normal Incapacitation due to abnormal movements: None, normal Patient's awareness of abnormal movements (rate only patient's report): No Awareness, Dental Status Current problems with teeth and/or dentures?: No Does patient usually wear dentures?: No   Musculoskeletal: Strength & Muscle Tone:untested in bed Gait & Station: untested in bed Patient leans: N/A  Psychiatric Specialty Exam:  Presentation  General Appearance: unkempt appearing  - wearing hair bonnet, dirty scrub top and layers of clothes  Eye Contact:Fair  Speech:rambling but normal fluency  Speech Volume:Normal  Mood and Affect  Mood:described as "pretty good" - appears calm and euthymic  Affect: calm, polite, confused  Thought Process  Thought Processes: concrete, ruminative about discharge planning; tangential at times   Orientation:Oriented to month, city, state and Software [place] not date or year and not for  reason for admission  Thought Content: Does not make delusional statements today unless prompted with questions; is not grossly responding to internal/external stimuli on exam; denies SI, HI, AVH or paranoia and denies ideas of reference or first rank symptoms and appears less suspicious and less guarded; ruminative about need for continued admission   Hallucinations:Denied  Ideas of Reference:Denied  Suicidal Thoughts:Denied   Homicidal Thoughts:Denied   Sensorium  Memory:Poor  Judgment:Impaired  Insight:Shallow  Executive Functions  Concentration:Poor  Attention Span:Poor  Recall:Poor  Fund of Knowledge:Limited  Language:Fair  Psychomotor Activity  Psychomotor Activity:no restlessness, akathisia or tremors noted; following instructions today better   Assets  Assets:Communication Skills; Social Support; Physical Health  Sleep  Total time unrecorded  Physical Exam Vitals and nursing note reviewed.  Constitutional:      Appearance: Normal appearance.  HENT:     Head: Normocephalic.     Mouth/Throat:     Comments: No drooling  noted Pulmonary:     Effort: Pulmonary effort is normal.  Neurological:     General: No focal deficit present.     Mental Status: [name] alert.    Review of Systems  Constitutional: Negative.   HENT: Negative.    Eyes: Negative.   Respiratory: Negative.  Negative for shortness of breath.   Cardiovascular: Negative.  Negative for chest pain.  Gastrointestinal: Negative.  Negative for abdominal pain, constipation, diarrhea, nausea and vomiting.  Genitourinary: Negative.   Musculoskeletal: Negative.   Skin: Negative.   Neurological: Negative.  Negative for dizziness and headaches.     Blood pressure 132/89, pulse (!) 130, temperature 98.6 F (37 C), temperature source Oral, resp. rate 16, height 5' 6"  (1.676 m), weight 72.6 kg, SpO2 93 %. Body mass index is 25.83 kg/m.  Treatment Plan Summary:  Diagnoses / Active  Problems: Bipolar I MRE manic with psychotic features Major Neurocognitive d/o with behavioral disturbance  PLAN: Safety and Monitoring:  -- Voluntary admission (signed in by guardian for treatment on 11/19/21) to inpatient psychiatric unit for safety, stabilization and treatment  -- Daily contact with patient to assess and evaluate symptoms and progress in treatment  -- Patient's case to be discussed in multi-disciplinary team meeting  -- Observation Level : q15 minute checks  -- Vital signs:  q12 hours  -- Precautions: suicide, elopement, and assault  2. Psychiatric Diagnoses and Treatment:   Bipolar I MRE manic with psychotic features (r/o schizoaffective d/o - bipolar type) Major neurocognitive disorder with behavioral disturbance (marked for age brain atrophy on CT head and MOCA 11/30)   -Continue Haldol 79m bid - (had decompensation with attempt to reduce dose lower) - Continue Cogentin 0.5 mg bid (had drooling with previous attempt at dose reduction)   -Invega Sustenna 234 mg IM given on 09-30-21, 1569mIM given 10/12/20 - Invega 78 mg given on 7/21, reduced dose given due to CrCl of 54 after consultation with pharmacist regarding monthly maintenance dose and consent obtained from guardian for medication administration  Currently on Haldol 2 mg twice daily as attempt to taper down dosing caused worsening psychosis, also on Invega sustain injection monthly, to avoid being on 2 antipsychotics I recommend to discontinue [place] continue with Haldol oral, titrate up if needed, consider Haldol decannulate to replace Invega LAI.  -- Zyprexa/Geodon/ativan agitation protocol PRN   --Additional psychosis labs (HIV- , RPR-, ANA-, heavy metal WNL, ESR wnl, ceruloplasmin wnl and CT head shows Marked for age brain atrophy.  No acute or reversible finding.)   --Depakote, Ativan, and Zyprexa stopped -- ACTT referral pending -- Continue Aricept 68m41maily and monitoring for GI sx - could  not complete repeat MOCA on 11/23/21 -- Interim guardianship in place with mother and waiting on SSDI and medicaid for group home referral  3. Medical Issues Being Addressed:   Tobacco Use Disorder  -- Nicotine patch 70m52m hours ordered and d/c nicotine gum    Hyperammonemia -resolved --Ammonia level 30 on 10/27/21 and 25 on 8/4 --Depakote was stopped    Mildly elevated creatinine - improving  --Creatinine of 1.05 on 8/4  -- Continue to encourage po fluids    Seasonal allergies --Restart Flonase PRN per guardian approval  Constipation - improved -- Monitor for GI side-effects with use of Aricept  -- Continue colace 100mg49m and encouraged po fluids -- PRN Miralax for severe constipation  Nausea/Vomiting - resolved -- No further emesis -- SLP saw for swallow assessment and feels  is cognitively based dysphagia - no limitations continue regular diet and thin liquid -- CXR negative for signs of aspiration on 8/6  Tachycardia - resolved     4. Discharge Planning:   -- Social work and case management to assist with discharge planning and identification of hospital follow-up needs prior to discharge  -- Discharge Concerns: Need to establish a safety plan; Medication compliance and effectiveness; safe housing   -- Discharge Goals: ALF or group home with outpatient referrals for mental health follow-up including medication management/psychotherapy  Labs On Admission- CMP: WNL except Cr: 1.1,  GFR: 60,  CBC: WNL,  UDS: Neg,  Troponin: 3,  EtOH: Neg,  beta hCG: 6.4,  Resp Panel: Neg 5/29- Urine Preg: Neg,  A1c: 5.3,  TSH: 1.719,  Lipid Panel: WNL except LDL: 101 5/24- BMP: WNL except Cr: 1.01,  Ca: 8.4,  hCG: 5,  Urine Preg: Neg 5/28- Ammonia: 32,  CBC: WNL,  CMP: WNL except  Cr: 1.08,  Ca: 8.6,  Depakote lvl: 20 6/3- Depakote lvl: 64, Hep Function: WNL,  CBC: WNL except Hem: 11.9 6/7- Depakote lvl: 75 6/18- UA: Neg,  CBC: WNL,  Depakote lvl: 136,  Ammonia: 99,  BMP: WNL except BUN:  22,  Cr: 1.19,  Ca: 8.6,  GFR: 54,  Hep Function: WNL except Albu: 3.3,  Indirect Bili: 0.2 6/20- ANA: Neg,  Ammonia: 48,  Ceruloplasmin: 24.2,  CMP: WNL except BUN: 23,  Cr: 1.35,  Ca: 8.3,  Albu: 3.4  HIV:Neg,  RPR: Neg,  Sed Rate: 6,  Depakote lvl: 25,  Heavy Metal: Neg 6/23- Ammonia: 46,  BMP: WNL except Cr: 1.2,  Ca: 8.3,  GFR: 54 6/24- UA: WNL except Bacteria: rare 6/30- CMP:  WNL except Cr: 1.04,  Ca: 8.6,  CBC: WNL,  Ammonia: 41, EKG: NSR with Qtc: 399 7/8 - CMP WNL other than CR 1.33 and GFR 48; Ammonia 30 7/18 - Cr of 1.22, eGFR of 53 7/23 - EKG with Qtc of 422 7/31 - creatinine 1.08 8/1 - creatinine 1.24 8/3 - COVID negative 8/4 - CBC WNL, NH3 25, CMP WNL other than creatinine 1.05 and glucose 122, lipase WNL 8/6 - CXR negative and COVID negative  [place] Winfred Leeds, MD 12/01/2021, 12:35 PM

## 2021-12-01 NOTE — [facility] Group Notes (Signed)
Goals Group 12/01/2021  Group Focus: affirmation, clarity of thought, and goals/reality orientation Treatment Modality:  Psychoeducation Interventions utilized were assignment, group exercise, and support Purpose: To be able to understand and verbalize the reason for their admission to the hospital. To understand that the medication helps with their chemical imbalance but they also need to work on their choices in life. To be challenged to develop a list of 30 positives about themselves. Also introduce the concept that "feelings" are not reality.  Participation Level:  Active  Participation Quality:  Appropriate  Affect:  Appropriate  Cognitive:  Appropriate  Insight:  Improving  Engagement in Group:  Engaged  Additional Comments: Pt rates her energy at a 5/10.  Dione Housekeeper

## 2021-12-01 NOTE — Group Note (Signed)
LCSW Group Therapy Note No social work group was held today due to newly separated halls necessitating a higher number of groups, a significant number of expected and unexpected discharges, a number of necessary Suicide Prevention Education calls to family members, and a high number of admissions that required initial psychosocial assessments.  The following was provided to each patient on 300 and 500 halls in lieu of in-person group:  Healthy vs. Unhealthy Coping Skills and Supports   Unhealthy Qualities                                             Healthy Qualities Works (at first) Works   Stops working or starts hurting Continues working  Fast Usually takes time to develop  Easy Often difficult to learn  Usually a habit Usually unknown, has to become a habit  Can do alone Often need to reach out for help   Leads to loss Leads to gain         My Unhealthy Coping Skills                                    My Healthy Coping Skills                       My Unhealthy Supports                                           My Healthy Supports                         Delesha Pohlman Grossman-Orr, LCSW 12/01/2021  2:40 PM     

## 2021-12-01 NOTE — Progress Notes (Signed)
Pt is A&OX3 (dementia), calm, denies suicidal ideations, denies homicidal ideations, denies auditory hallucinations and denies visual hallucinations. Pt verbally agrees to approach staff if these become apparent and before harming self or others. Pt denies experiencing nightmares. Mood and affect are congruent. Pt appetite is ok. No complaints of anxiety, distress, pain and/or discomfort at this time. Pt is diagnosed with Dementia. Pt hasn't displayed any injurious behaviors. Pt is medication compliant. There's no evidence of suicidal intent. Psychomotor activity was WNL. No s/s of Parkinson, Dystonia, Akathisia and/or Tardive Dyskinesia noted.

## 2021-12-02 DIAGNOSIS — F312 Bipolar disorder, current episode manic severe with psychotic features: Secondary | ICD-10-CM | POA: Diagnosis not present

## 2021-12-02 NOTE — [facility] Group Notes (Signed)
Adult Psychoeducational Group Note Date:  12/02/2021 Time:  0900-1045 Group Topic/Focus: PROGRESSIVE RELAXATION. A group where deep breathing is taught and tensing and relaxation muscle groups is used. Imagery is used as well.  Pts are asked to imagine 3 pillars that hold them up when they are not able to hold themselves up and to share that with the group.  Participation Level:  Active  Participation Quality: lacking  Affect:  flat  Cognitive:  Oriented  Insight: lacking  Engagement in Group:  paying attention Modes of Intervention:  Activity, Discussion, Education, and Support  Additional Comments:  Rates hers energy at a 4/10.   Dione Housekeeper

## 2021-12-02 NOTE — Group Note (Signed)
LCSW Group Therapy Note  12/02/2021      Type of Therapy and Topic:  Group Therapy: Gratitude   Description:   Group was not held due to acuity on the unit and the overall inability of patients to program in a productive manner.  A handout was given to each patient, with the following information:   Gratitude  "Acknowledging the good that you already have in your life is the foundation for all abundance." - Eckhart Tolle  " 'Enough' is a feast." - Buddhist Proverb  "Gratitude sweetens even the smallest moments."  "It is not joy that makes us grateful; It is gratitude that makes us joyful." - David Steindl-Rast    Put at least one response under each category of something for which you are grateful:  People:  Experiences:  Things:  Places:  Skills:  Other:  Add more responses as you get ideas from other people.   Therapeutic Modalities:   Activity  Jacolyn Joaquin J Grossman-Orr, LCSW .  

## 2021-12-02 NOTE — Progress Notes (Signed)
[facility] MD Progress Note  12/02/2021 11:32 AM Natalie Brown  MRN:  [id number]  Reason for Admission:  Natalie Brown is a 56 y.o. female  who was initially admitted for inpatient psychiatric hospitalization on 09/06/2021 for management of mania, disorganized behaviors/thinking, and delusions. The patient is currently on Hospital Day 87.   Chart Review from last 24 hours:  The patient's chart was reviewed and nursing notes were reviewed. The patient's case was discussed in multidisciplinary team meeting. Per nursing report, she attended select groups, remained disheveled and pleasantly confused, but had no acute behavioral issues noted. Per MAR she was compliant with scheduled po medications and did not require PRNs. Patient had 1 incident of vomiting today morning after breakfast but no other complaints reported.  Information Obtained Today During Patient Interview: Patient was evaluated in her room, continues to present pleasant and cooperative, continues to be confused at baseline, denying any depressed mood does not present hyper or manic, does not present responding to stimuli or paranoid or delusional but continues to be confused at baseline consistent with dementia diagnosis stable with no worsening.  Denies side effect of medications and she does not appear to be drooling during evaluation.  She does not display any sign of EPS at this time.  Patient describes doing well in general and denies any problem when asked.  Principal Problem: Bipolar affective disorder, current episode manic with psychotic symptoms ([place]) Diagnosis: Principal Problem:   Bipolar affective disorder, current episode manic with psychotic symptoms ([place]) Active Problems:   Major neurocognitive disorder ([place])  Past Psychiatric History: see H&P  Past Medical History:  Past Medical History:  Diagnosis Date   Bipolar 1 disorder with moderate mania ([place]) 12/30/2013   Hypertension    Family History: see H&P  Family  Psychiatric  History: see H&P  Social History:  Homeless; mother is interim guardian   Sleep: Good  Appetite:  Good  Current Medications: Current Facility-Administered Medications  Medication Dose Route Frequency Provider Last Rate Last Admin   acetaminophen (TYLENOL) tablet 650 mg  650 mg Oral Q6H PRN Briant Cedar, MD   650 mg at 11/14/21 0759   alum & mag hydroxide-simeth (MAALOX/MYLANTA) 200-200-20 MG/5ML suspension 30 mL  30 mL Oral Q4H PRN Massengill, [name], MD       benztropine (COGENTIN) tablet 0.5 mg  0.5 mg Oral BID Nelda Marseille, Amy E, MD   0.5 mg at 12/02/21 0817   docusate sodium (COLACE) capsule 100 mg  100 mg Oral BID Nelda Marseille, Amy E, MD   100 mg at 12/02/21 0817   donepezil (ARICEPT) tablet 5 mg  5 mg Oral Daily Nelda Marseille, Amy E, MD   5 mg at 12/02/21 0817   feeding supplement (KATE FARMS STANDARD 1.4) liquid 325 mL  325 mL Oral BID BM Massengill, [name], MD   325 mL at 12/02/21 0832   fluticasone (FLONASE) 50 MCG/ACT nasal spray 1 spray  1 spray Each Nare Daily PRN Harlow Asa, MD   1 spray at 11/30/21 2031   haloperidol (HALDOL) tablet 2 mg  2 mg Oral BID Harlow Asa, MD   2 mg at 12/02/21 0347   hydrocerin (EUCERIN) cream   Topical BID Janine Limbo, MD   Given at 12/02/21 0817   hydrOXYzine (ATARAX) tablet 25 mg  25 mg Oral TID PRN Janine Limbo, MD   25 mg at 11/30/21 2158   LORazepam (ATIVAN) tablet 1 mg  1 mg Oral Q6H PRN Harlow Asa,  MD   1 mg at 10/07/21 1546   magnesium hydroxide (MILK OF MAGNESIA) suspension 30 mL  30 mL Oral Daily PRN Massengill, [name], MD   30 mL at 11/14/21 0802   nicotine (NICODERM CQ - dosed in mg/24 hours) patch 14 mg  14 mg Transdermal Daily Nelda Marseille, Amy E, MD   14 mg at 12/02/21 0817   OLANZapine zydis (ZYPREXA) disintegrating tablet 5 mg  5 mg Oral Q8H PRN Harlow Asa, MD   5 mg at 10/14/21 1416   And   ziprasidone (GEODON) injection 20 mg  20 mg Intramuscular PRN Harlow Asa, MD        ondansetron [facility]) tablet 4 mg  4 mg Oral Q8H PRN Harlow Asa, MD   4 mg at 11/23/21 2094   polyethylene glycol (MIRALAX / GLYCOLAX) packet 17 g  17 g Oral Daily PRN Harlow Asa, MD       Lab Results:  No results found for this or any previous visit (from the past 21 hour(s)).     Blood Alcohol level:  Lab Results  Component Value Date   ETH <10 70/96/2836   Metabolic Disorder Labs: Lab Results  Component Value Date   HGBA1C 5.3 09/08/2021   MPG 105.41 09/08/2021   No results found for: "PROLACTIN" Lab Results  Component Value Date   CHOL 173 09/08/2021   TRIG 110 09/08/2021   HDL 50 09/08/2021   CHOLHDL 3.5 09/08/2021   VLDL 22 09/08/2021   LDLCALC 101 (H) 09/08/2021   LDLCALC 102 (H) 06/21/2020   Physical Findings:  AIMS: Facial and Oral Movements Muscles of Facial Expression: None, normal Lips and Perioral Area: None, normal Jaw: None, normal Tongue: None, normal,Extremity Movements Upper (arms, wrists, hands, fingers): None, normal Lower (legs, knees, ankles, toes): None, normal, Trunk Movements Neck, shoulders, hips: None, normal, Overall Severity Severity of abnormal movements (highest score from questions above): None, normal Incapacitation due to abnormal movements: None, normal Patient's awareness of abnormal movements (rate only patient's report): No Awareness, Dental Status Current problems with teeth and/or dentures?: No Does patient usually wear dentures?: No   Musculoskeletal: Strength & Muscle Tone:untested in bed Gait & Station: untested in bed Patient leans: N/A  Psychiatric Specialty Exam:  Presentation  General Appearance: unkempt appearing  - wearing hair bonnet, dirty scrub top and layers of clothes  Eye Contact:Fair  Speech:rambling but normal fluency  Speech Volume:Normal  Mood and Affect  Mood:described as "pretty good" - appears calm and euthymic  Affect: calm, polite, confused  Thought Process  Thought  Processes: concrete, ruminative about discharge planning; tangential at times   Orientation:Oriented to month, city, state and Software [place] not date or year and not for reason for admission  Thought Content: Does not make delusional statements today unless prompted with questions; is not grossly responding to internal/external stimuli on exam; denies SI, HI, AVH or paranoia and denies ideas of reference or first rank symptoms and appears less suspicious and less guarded; ruminative about need for continued admission   Hallucinations:Denied  Ideas of Reference:Denied  Suicidal Thoughts:Denied   Homicidal Thoughts:Denied   Sensorium  Memory:Poor  Judgment:Impaired  Insight:Shallow  Executive Functions  Concentration:Poor  Attention Span:Poor  Recall:Poor  Fund of Knowledge:Limited  Language:Fair  Psychomotor Activity  Psychomotor Activity:no restlessness, akathisia or tremors noted; following instructions today better   Assets  Assets:Communication Skills; Social Support; Physical Health  Sleep  Total time unrecorded  Physical Exam Vitals and nursing note reviewed.  Constitutional:      Appearance: Normal appearance.  HENT:     Head: Normocephalic.     Mouth/Throat:     Comments: No drooling noted Pulmonary:     Effort: Pulmonary effort is normal.  Neurological:     General: No focal deficit present.     Mental Status: [name] alert.    Review of Systems  Constitutional: Negative.   HENT: Negative.    Eyes: Negative.   Respiratory: Negative.  Negative for shortness of breath.   Cardiovascular: Negative.  Negative for chest pain.  Gastrointestinal: Negative.  Negative for abdominal pain, constipation, diarrhea, nausea and vomiting.  Genitourinary: Negative.   Musculoskeletal: Negative.   Skin: Negative.   Neurological: Negative.  Negative for dizziness and headaches.     Blood pressure 132/84, pulse (!) 122, temperature 98.5 F (36.9 C), temperature  source Oral, resp. rate 18, height 5' 6"  (1.676 m), weight 72.6 kg, SpO2 99 %. Body mass index is 25.83 kg/m.  Treatment Plan Summary:  Diagnoses / Active Problems: Bipolar I MRE manic with psychotic features Major Neurocognitive d/o with behavioral disturbance  PLAN: Safety and Monitoring:  -- Voluntary admission (signed in by guardian for treatment on 11/19/21) to inpatient psychiatric unit for safety, stabilization and treatment  -- Daily contact with patient to assess and evaluate symptoms and progress in treatment  -- Patient's case to be discussed in multi-disciplinary team meeting  -- Observation Level : q15 minute checks  -- Vital signs:  q12 hours  -- Precautions: suicide, elopement, and assault  2. Psychiatric Diagnoses and Treatment:   Bipolar I MRE manic with psychotic features (r/o schizoaffective d/o - bipolar type) Major neurocognitive disorder with behavioral disturbance (marked for age brain atrophy on CT head and MOCA 11/30)   -Continue Haldol 147m bid - (had decompensation with attempt to reduce dose lower) - Continue Cogentin 0.5 mg bid (had drooling with previous attempt at dose reduction)   -Invega Sustenna 234 mg IM given on 09-30-21, 1552mIM given 10/12/20 - Invega 78 mg given on 7/21, reduced dose given due to CrCl of 54 after consultation with pharmacist regarding monthly maintenance dose and consent obtained from guardian for medication administration  Currently on Haldol 2 mg twice daily as attempt to taper down dosing caused worsening psychosis, also on Invega sustain injection monthly, to avoid being on 2 antipsychotics I recommend to discontinue [place] continue with Haldol oral, titrate up if needed, consider Haldol decannulate to replace Invega LAI.  -- Zyprexa/Geodon/ativan agitation protocol PRN   --Additional psychosis labs (HIV- , RPR-, ANA-, heavy metal WNL, ESR wnl, ceruloplasmin wnl and CT head shows Marked for age brain atrophy.  No acute  or reversible finding.)   --Depakote, Ativan, and Zyprexa stopped -- ACTT referral pending -- Continue Aricept 47m2maily and monitoring for GI sx - could not complete repeat MOCA on 11/23/21 -- Interim guardianship in place with mother and waiting on SSDI and medicaid for group home referral  3. Medical Issues Being Addressed:   Tobacco Use Disorder  -- Nicotine patch 55m64m hours ordered and d/c nicotine gum    Hyperammonemia -resolved --Ammonia level 30 on 10/27/21 and 25 on 8/4 --Depakote was stopped    Mildly elevated creatinine - improving  --Creatinine of 1.05 on 8/4  -- Continue to encourage po fluids    Seasonal allergies --Restart Flonase PRN per guardian approval  Constipation - improved -- Monitor for GI side-effects with use of Aricept  -- Continue  colace 117m bid and encouraged po fluids -- PRN Miralax for severe constipation  Nausea/Vomiting - resolved -- No further emesis -- SLP saw for swallow assessment and feels is cognitively based dysphagia - no limitations continue regular diet and thin liquid -- CXR negative for signs of aspiration on 8/6  Tachycardia - resolved     4. Discharge Planning:   -- Social work and case management to assist with discharge planning and identification of hospital follow-up needs prior to discharge  -- Discharge Concerns: Need to establish a safety plan; Medication compliance and effectiveness; safe housing   -- Discharge Goals: ALF or group home with outpatient referrals for mental health follow-up including medication management/psychotherapy  Labs On Admission- CMP: WNL except Cr: 1.1,  GFR: 60,  CBC: WNL,  UDS: Neg,  Troponin: 3,  EtOH: Neg,  beta hCG: 6.4,  Resp Panel: Neg 5/29- Urine Preg: Neg,  A1c: 5.3,  TSH: 1.719,  Lipid Panel: WNL except LDL: 101 5/24- BMP: WNL except Cr: 1.01,  Ca: 8.4,  hCG: 5,  Urine Preg: Neg 5/28- Ammonia: 32,  CBC: WNL,  CMP: WNL except  Cr: 1.08,  Ca: 8.6,  Depakote lvl: 20 6/3- Depakote lvl:  64, Hep Function: WNL,  CBC: WNL except Hem: 11.9 6/7- Depakote lvl: 75 6/18- UA: Neg,  CBC: WNL,  Depakote lvl: 136,  Ammonia: 99,  BMP: WNL except BUN: 22,  Cr: 1.19,  Ca: 8.6,  GFR: 54,  Hep Function: WNL except Albu: 3.3,  Indirect Bili: 0.2 6/20- ANA: Neg,  Ammonia: 48,  Ceruloplasmin: 24.2,  CMP: WNL except BUN: 23,  Cr: 1.35,  Ca: 8.3,  Albu: 3.4  HIV:Neg,  RPR: Neg,  Sed Rate: 6,  Depakote lvl: 25,  Heavy Metal: Neg 6/23- Ammonia: 46,  BMP: WNL except Cr: 1.2,  Ca: 8.3,  GFR: 54 6/24- UA: WNL except Bacteria: rare 6/30- CMP:  WNL except Cr: 1.04,  Ca: 8.6,  CBC: WNL,  Ammonia: 41, EKG: NSR with Qtc: 399 7/8 - CMP WNL other than CR 1.33 and GFR 48; Ammonia 30 7/18 - Cr of 1.22, eGFR of 53 7/23 - EKG with Qtc of 422 7/31 - creatinine 1.08 8/1 - creatinine 1.24 8/3 - COVID negative 8/4 - CBC WNL, NH3 25, CMP WNL other than creatinine 1.05 and glucose 122, lipase WNL 8/6 - CXR negative and COVID negative  Yariah Selvey AWinfred Leeds MD 12/02/2021, 11:32 AM

## 2021-12-02 NOTE — Progress Notes (Signed)
Pt appears to be confused/disorganized/restless. Pt answers "No" to SI/HI/AVH. Per MHT, Pt vomited in dayroom-food fragments observed. MD notified. Pt took her meds without issue. Pt remains safe.

## 2021-12-02 NOTE — [facility] Group Notes (Signed)
Did not attend psychoed group

## 2021-12-02 NOTE — Progress Notes (Signed)
   12/02/21 2100  Psych Admission Type (Psych Patients Only)  Admission Status Involuntary  Psychosocial Assessment  Patient Complaints Anxiety  Eye Contact Brief  Facial Expression Flat  Affect Anxious  Speech Soft;Slow  Interaction Guarded  Motor Activity Slow  Appearance/Hygiene Disheveled;Poor hygiene  Behavior Characteristics Cooperative;Appropriate to situation  Mood Preoccupied  Thought Process  Coherency Disorganized  Content WDL  Delusions WDL  Perception WDL  Hallucination None reported or observed  Judgment Impaired  Confusion Moderate  Danger to Self  Current suicidal ideation? Denies  Danger to Others  Danger to Others None reported or observed

## 2021-12-02 NOTE — Progress Notes (Signed)
Pt vomited in the dayroom this morning on two of the walls and in one of the chairs. Pt then proceeded to try and drink more coffee but was quickly redirected to go to her room to clean up. Pt was being difficult and had to be walked down to her room. This is the second time in two days where she has vomited (yesterday was in the cafeteria). Pt will continue to be monitored while on the unit.

## 2021-12-03 DIAGNOSIS — F312 Bipolar disorder, current episode manic severe with psychotic features: Secondary | ICD-10-CM | POA: Diagnosis not present

## 2021-12-03 NOTE — Progress Notes (Signed)
Adult Psychoeducational Group Note  Date:  12/03/2021 Time:  11:20 PM  Group Topic/Focus:  Wrap-Up Group:   The focus of this group is to help patients review their daily goal of treatment and discuss progress on daily workbooks.  Participation Level:  Active  Participation Quality:  Appropriate  Affect:  Anxious  Cognitive:  Disorganized, Confused, and Delusional  Insight: Lacking and Limited  Engagement in Group:  Lacking and Limited  Modes of Intervention:  Discussion  Additional Comments:  Pt stated her goal for today was to focus on her treatment plan. Pt stated she accomplished her goal today. Pt stated she talked with her doctor and with her social worker about her care today. Pt rated her overall day a 8 out of 10. Pt stated her boyfriend coming for visitation tonight improved her overall day. Pt stated she felt better about herself tonight. Pt stated staff brought back all her meals today. Pt stated she took all medications provided today. Pt stated her appetite was fair today. Pt rated her sleep last night was good. Pt stated the goal tonight was to get some rest. Pt stated she had no physical pain tonight. Pt deny visual hallucinations and auditory issues tonight. Pt denies thoughts of harming herself or others. Pt stated she would alert staff if anything changed.  Natalie Brown 12/03/2021, 11:20 PM

## 2021-12-03 NOTE — Progress Notes (Signed)
[facility] MD Progress Note  12/03/2021 3:59 PM Natalie Brown  MRN:  [id number]  Reason for Admission:  Natalie Brown is a 56 y.o. female  who was initially admitted for inpatient psychiatric hospitalization on 09/06/2021 for management of mania, disorganized behaviors/thinking, and delusions. The patient is currently on Hospital Day 88.   Chart Review from last 24 hours:  The patient's chart was reviewed and nursing notes were reviewed. The patient's case was discussed in multidisciplinary team meeting. Per nursing report, she is continuing to attend select groups & has remained disheveled and pleasantly confused, but has had no acute behavioral issues noted. Per MAR she was compliant with scheduled po medications and did not require PRNs. Patient had 1 incident of vomiting yesterday, and has not had any more since then. BP has been WNL for the, past 24 hrs, and HR with slight elevations at times, with the highest being earlier today morning at 122. HR has normalized at 100.  Information Obtained Today During Patient Interview: During this encounter, pt presents with a euthymic mood & affect is congruent. Her attention to personal hygiene and grooming is poor and she appears disheveled, but refuses to change into a gown so that staff can launder her clothing. She wears a bonnet over her hair and refuses to take it off for an assessment of her hair, but states that she has been washing her hair. Eye contact is good, speech is clear & coherent. Thought contents are unorganized with flight of ideas, and she is illogical at times as she rambles from one topic to another. Pt currently denies SI/HI/AVH or paranoia.   She continues to state that she can speak multiple languages which is a fixed delusion at this point. She reports a good sleep quality and reports a good appetite, and denies feeling nauseous today. There is no evidence of TD/EPS type symptoms, but pt pools saliva in her mouth which slowly oozes  out as she interacts with Probation officer. This seems to be related to her dementia.We will continue medications as listed below and continue to monitor. Pt denies being in any physical distress today.   Principal Problem: Bipolar affective disorder, current episode manic with psychotic symptoms ([place]) Diagnosis: Principal Problem:   Bipolar affective disorder, current episode manic with psychotic symptoms ([place]) Active Problems:   Major neurocognitive disorder ([place])  Past Psychiatric History: see H&P  Past Medical History:  Past Medical History:  Diagnosis Date   Bipolar 1 disorder with moderate mania ([place]) 12/30/2013   Hypertension    Family History: see H&P  Family Psychiatric  History: see H&P  Social History:  Homeless; mother is interim guardian   Sleep: Good  Appetite:  Good  Current Medications: Current Facility-Administered Medications  Medication Dose Route Frequency Provider Last Rate Last Admin   acetaminophen (TYLENOL) tablet 650 mg  650 mg Oral Q6H PRN Briant Cedar, MD   650 mg at 11/14/21 0759   alum & mag hydroxide-simeth (MAALOX/MYLANTA) 200-200-20 MG/5ML suspension 30 mL  30 mL Oral Q4H PRN Massengill, [name], MD       benztropine (COGENTIN) tablet 0.5 mg  0.5 mg Oral BID Nelda Marseille, Amy E, MD   0.5 mg at 12/03/21 0809   docusate sodium (COLACE) capsule 100 mg  100 mg Oral BID Nelda Marseille, Amy E, MD   100 mg at 12/03/21 0809   donepezil (ARICEPT) tablet 5 mg  5 mg Oral Daily Harlow Asa, MD   5 mg at 12/03/21 [phone]  feeding supplement (KATE FARMS STANDARD 1.4) liquid 325 mL  325 mL Oral BID BM Massengill, [name], MD   325 mL at 12/02/21 0832   fluticasone (FLONASE) 50 MCG/ACT nasal spray 1 spray  1 spray Each Nare Daily PRN Harlow Asa, MD   1 spray at 11/30/21 2031   haloperidol (HALDOL) tablet 2 mg  2 mg Oral BID Harlow Asa, MD   2 mg at 12/03/21 2482   hydrocerin (EUCERIN) cream   Topical BID Janine Limbo, MD   Given at 12/02/21 1619    hydrOXYzine (ATARAX) tablet 25 mg  25 mg Oral TID PRN Janine Limbo, MD   25 mg at 12/02/21 2022   LORazepam (ATIVAN) tablet 1 mg  1 mg Oral Q6H PRN Harlow Asa, MD   1 mg at 10/07/21 1546   magnesium hydroxide (MILK OF MAGNESIA) suspension 30 mL  30 mL Oral Daily PRN Massengill, [name], MD   30 mL at 11/14/21 0802   nicotine (NICODERM CQ - dosed in mg/24 hours) patch 14 mg  14 mg Transdermal Daily Nelda Marseille, Amy E, MD   14 mg at 12/03/21 0809   OLANZapine zydis (ZYPREXA) disintegrating tablet 5 mg  5 mg Oral Q8H PRN Harlow Asa, MD   5 mg at 10/14/21 1416   And   ziprasidone (GEODON) injection 20 mg  20 mg Intramuscular PRN Harlow Asa, MD       ondansetron [facility]) tablet 4 mg  4 mg Oral Q8H PRN Harlow Asa, MD   4 mg at 11/23/21 5003   polyethylene glycol (MIRALAX / GLYCOLAX) packet 17 g  17 g Oral Daily PRN Harlow Asa, MD       Lab Results:  No results found for this or any previous visit (from the past 8 hour(s)).     Blood Alcohol level:  Lab Results  Component Value Date   ETH <10 70/48/8891   Metabolic Disorder Labs: Lab Results  Component Value Date   HGBA1C 5.3 09/08/2021   MPG 105.41 09/08/2021   No results found for: "PROLACTIN" Lab Results  Component Value Date   CHOL 173 09/08/2021   TRIG 110 09/08/2021   HDL 50 09/08/2021   CHOLHDL 3.5 09/08/2021   VLDL 22 09/08/2021   LDLCALC 101 (H) 09/08/2021   LDLCALC 102 (H) 06/21/2020   Physical Findings:  AIMS: Facial and Oral Movements Muscles of Facial Expression: None, normal Lips and Perioral Area: None, normal Jaw: None, normal Tongue: None, normal,Extremity Movements Upper (arms, wrists, hands, fingers): None, normal Lower (legs, knees, ankles, toes): None, normal, Trunk Movements Neck, shoulders, hips: None, normal, Overall Severity Severity of abnormal movements (highest score from questions above): None, normal Incapacitation due to abnormal movements: None,  normal Patient's awareness of abnormal movements (rate only patient's report): No Awareness, Dental Status Current problems with teeth and/or dentures?: No Does patient usually wear dentures?: No   Musculoskeletal: Strength & Muscle Tone:untested in bed Gait & Station: untested in bed Patient leans: N/A  Psychiatric Specialty Exam:  Presentation  General Appearance: unkempt appearing  - wearing hair bonnet, dirty scrub top and layers of clothes  Eye Contact:Fair  Speech:rambling but normal fluency  Speech Volume:Normal  Mood and Affect  Mood:described as "pretty good" - appears calm and euthymic  Affect: calm, polite, confused  Thought Process  Thought Processes: concrete, ruminative about discharge planning; tangential at times   Orientation:Oriented to month, city, state and President not date or year and not  for reason for admission  Thought Content: Does not make delusional statements today unless prompted with questions; is not grossly responding to internal/external stimuli on exam; denies SI, HI, AVH or paranoia and denies ideas of reference or first rank symptoms and appears less suspicious and less guarded; ruminative about need for continued admission   Hallucinations:Denied  Ideas of Reference:Denied  Suicidal Thoughts:Denied   Homicidal Thoughts:Denied   Sensorium  Memory:Poor  Judgment:Poor  Insight:Poor  Executive Functions  Concentration:Poor  Attention Span:Poor  [place]  Language:Good  Psychomotor Activity  Psychomotor Activity:no restlessness, akathisia or tremors noted; following instructions today better   Assets  Assets:Resilience  Sleep  Total time unrecorded  Physical Exam Vitals and nursing note reviewed.  Constitutional:      Appearance: Normal appearance.  HENT:     Head: Normocephalic.     Mouth/Throat:     Comments: No drooling noted Pulmonary:     Effort: Pulmonary effort is  normal.  Neurological:     General: No focal deficit present.     Mental Status: [name] alert.    Review of Systems  Constitutional: Negative.   HENT: Negative.    Eyes: Negative.   Respiratory: Negative.  Negative for shortness of breath.   Cardiovascular: Negative.  Negative for chest pain.  Gastrointestinal: Negative.  Negative for abdominal pain, constipation, diarrhea, nausea and vomiting.  Genitourinary: Negative.   Musculoskeletal: Negative.   Skin: Negative.   Neurological: Negative.  Negative for dizziness and headaches.  Psychiatric/Behavioral:  Negative for depression, memory loss, substance abuse and suicidal ideas. The patient is not nervous/anxious and does not have insomnia.      Blood pressure 130/82, pulse 100, temperature 98.3 F (36.8 C), temperature source Oral, resp. rate 18, height 5' 6" (1.676 m), weight 72.6 kg, SpO2 98 %. Body mass index is 25.83 kg/m.  Treatment Plan Summary:  Diagnoses / Active Problems: Bipolar I MRE manic with psychotic features Major Neurocognitive d/o with behavioral disturbance  PLAN: Safety and Monitoring:  -- Voluntary admission (signed in by guardian for treatment on 11/19/21) to inpatient psychiatric unit for safety, stabilization and treatment  -- Daily contact with patient to assess and evaluate symptoms and progress in treatment  -- Patient's case to be discussed in multi-disciplinary team meeting  -- Observation Level : q15 minute checks  -- Vital signs:  q12 hours  -- Precautions: suicide, elopement, and assault  2. Psychiatric Diagnoses and Treatment:   Bipolar I MRE manic with psychotic features (r/o schizoaffective d/o - bipolar type) Major neurocognitive disorder with behavioral disturbance (marked for age brain atrophy on CT head and MOCA 11/30)   -Continue Haldol 63m bid - (had decompensation with attempt to reduce dose lower) - Continue Cogentin 0.5 mg bid (had drooling with previous attempt at dose  reduction)   -Invega Sustenna 234 mg IM given on 09-30-21, 1576mIM given 10/12/20 - Invega 78 mg given on 7/21, reduced dose given due to CrCl of 54 after consultation with pharmacist regarding monthly maintenance dose and consent obtained from guardian for medication administration  We will continue Haldol 2 mg twice daily as attempt to taper down dosing caused worsening psychosis. In order to avoid being on 2 antipsychotics recommendations given by Dr. AtWinfred Leedso discontinue InKirt Boysnd continue with Haldol oral, titrate up if needed, consider Haldol decannulate to replace Invega LAI.  -- Zyprexa/Geodon/ativan agitation protocol PRN   --Additional psychosis labs (HIV- , RPR-, ANA-, heavy metal WNL, ESR  wnl, ceruloplasmin wnl and CT head shows Marked for age brain atrophy.  No acute or reversible finding.)   --Depakote, Ativan, and Zyprexa stopped -- ACTT referral pending -- Continue Aricept 44m daily and monitoring for GI sx - could not complete repeat MOCA on 11/23/21 -- Interim guardianship in place with mother and waiting on SSDI and medicaid for group home referral  3. Medical Issues Being Addressed:   Tobacco Use Disorder  -- Nicotine patch 122m24 hours ordered and d/c nicotine gum    Hyperammonemia -resolved --Ammonia level 30 on 10/27/21 and 25 on 8/4 --Depakote was stopped    Mildly elevated creatinine - improving  --Creatinine of 1.05 on 8/4  -- Continue to encourage po fluids    Seasonal allergies --Restart Flonase PRN per guardian approval  Constipation - improved -- Monitor for GI side-effects with use of Aricept  -- Continue colace 10063mid and encouraged po fluids -- PRN Miralax for severe constipation  Nausea/Vomiting - resolved -- No further emesis -- SLP saw for swallow assessment and feels is cognitively based dysphagia - no limitations continue regular diet and thin liquid -- CXR negative for signs of aspiration on 8/6  Tachycardia - resolved     4.  Discharge Planning:   -- Social work and case management to assist with discharge planning and identification of hospital follow-up needs prior to discharge  -- Discharge Concerns: Need to establish a safety plan; Medication compliance and effectiveness; safe housing   -- Discharge Goals: ALF or group home with outpatient referrals for mental health follow-up including medication management/psychotherapy  Labs On Admission- CMP: WNL except Cr: 1.1,  GFR: 60,  CBC: WNL,  UDS: Neg,  Troponin: 3,  EtOH: Neg,  beta hCG: 6.4,  Resp Panel: Neg 5/29- Urine Preg: Neg,  A1c: 5.3,  TSH: 1.719,  Lipid Panel: WNL except LDL: 101 5/24- BMP: WNL except Cr: 1.01,  Ca: 8.4,  hCG: 5,  Urine Preg: Neg 5/28- Ammonia: 32,  CBC: WNL,  CMP: WNL except  Cr: 1.08,  Ca: 8.6,  Depakote lvl: 20 6/3- Depakote lvl: 64, Hep Function: WNL,  CBC: WNL except Hem: 11.9 6/7- Depakote lvl: 75 6/18- UA: Neg,  CBC: WNL,  Depakote lvl: 136,  Ammonia: 99,  BMP: WNL except BUN: 22,  Cr: 1.19,  Ca: 8.6,  GFR: 54,  Hep Function: WNL except Albu: 3.3,  Indirect Bili: 0.2 6/20- ANA: Neg,  Ammonia: 48,  Ceruloplasmin: 24.2,  CMP: WNL except BUN: 23,  Cr: 1.35,  Ca: 8.3,  Albu: 3.4  HIV:Neg,  RPR: Neg,  Sed Rate: 6,  Depakote lvl: 25,  Heavy Metal: Neg 6/23- Ammonia: 46,  BMP: WNL except Cr: 1.2,  Ca: 8.3,  GFR: 54 6/24- UA: WNL except Bacteria: rare 6/30- CMP:  WNL except Cr: 1.04,  Ca: 8.6,  CBC: WNL,  Ammonia: 41, EKG: NSR with Qtc: 399 7/8 - CMP WNL other than CR 1.33 and GFR 48; Ammonia 30 7/18 - Cr of 1.22, eGFR of 53 7/23 - EKG with Qtc of 422 7/31 - creatinine 1.08 8/1 - creatinine 1.24 8/3 - COVID negative 8/4 - CBC WNL, NH3 25, CMP WNL other than creatinine 1.05 and glucose 122, lipase WNL 8/6 - CXR negative and COVID negative  DorNicholes RoughP 12/03/2021, 3:59 PM Patient ID: AngManuella Ghaziemale   DOB: [birth year] 45o.   MRN: [id number]

## 2021-12-03 NOTE — Progress Notes (Signed)
   12/03/21 2130  Psych Admission Type (Psych Patients Only)  Admission Status Involuntary  Psychosocial Assessment  Patient Complaints Confusion  Eye Contact Fair;Glaring  Facial Expression Flat  Affect Anxious  Speech Soft  Interaction Cautious  Motor Activity Slow  Appearance/Hygiene Poor hygiene  Behavior Characteristics Cooperative  Mood Preoccupied  Aggressive Behavior  Effect No apparent injury  Thought Process  Coherency Disorganized  Content Blaming others  Delusions WDL  Perception WDL  Hallucination None reported or observed  Judgment Impaired  Confusion Mild  Danger to Self  Current suicidal ideation? Denies  Danger to Others  Danger to Others None reported or observed

## 2021-12-03 NOTE — Group Note (Signed)
Recreation Therapy Group Note   Group Topic:Coping Skills  Group Date: 12/03/2021 Start Time: 1000 End Time: 1030 Facilitators: Caroll Rancher, LRT,CTRS Location: [address] Dayroom   Goal Area(s) Addresses: Patient will define what a coping skill is. Patient will work with peer to create a list of healthy coping skills beginning with each letter of the alphabet. Patient will successfully identify positive coping skills they can use post d/c.  Patient will acknowledge benefit(s) of using learned coping skills post d/c.  Group Description: Coping A to Z. Patient asked to identify what a coping skill is and when they use them. Patients with Clinical research associate discussed healthy versus unhealthy coping skills. Next patients were given a blank worksheet titled "Coping Skills A-Z".  Patients were instructed to come up with at least one positive coping skill per letter of the alphabet.  Patients were given 15 minutes to brainstorm, before ideas were presented to the large group. Patients and LRT debriefed on the importance of coping skill selection based on situation and back-up plans when a skill tried is not effective. At the end of group, patients were given an handout of alphabetized strategies to keep for future reference.   Affect/Mood: N/A   Participation Level: Did not attend    Clinical Observations/Individualized Feedback:     Plan: Continue to engage patient in RT group sessions 2-3x/week.   Caroll Rancher, LRT,CTRS 12/03/2021 11:49 AM

## 2021-12-03 NOTE — Group Note (Signed)
12/03/2021 1pm   Type of Therapy and Topic:  Group Therapy:  Who Am I?  Self Esteem, Self-Actualization and Understanding Self.    Participation Level:  Did Not Attend  Description of Group:   In this group patients will be asked to explore values, beliefs, truths, and morals as they relate to personal self.  Patients will be guided to discuss their thoughts, feelings, and behaviors related to what they identify as important to their true self. Patients will process together how values, beliefs and truths are connected to specific choices patients make every day. Each patient will be challenged to identify changes that they are motivated to make in order to improve self-esteem and self-actualization. This group will be process-oriented, with patients participating in exploration of their own experiences, giving and receiving support, and processing challenge from other group members.   Therapeutic Goals: Patient will identify false beliefs that currently interfere with their self-esteem.  Patient will identify feelings, thought process, and behaviors related to self and will become aware of the uniqueness of themselves and of others.  Patient will be able to identify and verbalize values, morals, and beliefs as they relate to self. Patient will begin to learn how to build self-esteem/self-awareness by expressing what is important and unique to them personally.   Summary of Patient Progress: X     Therapeutic Modalities:   Cognitive Behavioral Therapy Solution Focused Therapy Motivational Interviewing Brief Therapy   Wilferd Ritson E Evoleth Nordmeyer, LCSW 12/03/2021 1:51 PM

## 2021-12-03 NOTE — Progress Notes (Signed)
Patient did not attend morning orientation group.  

## 2021-12-04 DIAGNOSIS — F312 Bipolar disorder, current episode manic severe with psychotic features: Secondary | ICD-10-CM | POA: Diagnosis not present

## 2021-12-04 NOTE — Progress Notes (Signed)
Adult Psychoeducational Group Note  Date:  12/04/2021 Time:  10:39 PM  Group Topic/Focus:  Wrap-Up Group:   The focus of this group is to help patients review their daily goal of treatment and discuss progress on daily workbooks.  Participation Level:  Did Not Attend  Participation Quality:   Did Not Attend  Affect:   Did Not Attend  Cognitive:   Did Not Attend  Insight: None  Engagement in Group:   Did Not Attend   Modes of Intervention:   Did Not Attend  Additional Comments:  Pt was encouraged to attend wrap up group but did not attend.  Felipa Furnace 12/04/2021, 10:39 PM

## 2021-12-04 NOTE — Progress Notes (Addendum)
[facility] MD Progress Note  12/04/2021 4:52 PM Natalie Brown  MRN:  [id number]  Reason for Admission:  Natalie Brown is a 56 y.o. female  who was initially admitted for inpatient psychiatric hospitalization on 09/06/2021 for management of mania, disorganized behaviors/thinking, and delusions. The patient is currently on Hospital Day 89.   Chart Review from last 24 hours:  The patient's chart was reviewed and nursing notes were reviewed. The patient's case was discussed in multidisciplinary team meeting. Per nursing she attended select groups. She remained disorganized and confused but had no acute behavioral issues noted. Per MAR she was compliant with scheduled medications except Eucerin cream and did receive Vistaril X1 po yesterday.   Information Obtained Today During Patient Interview: The patient was interviewed in her room. She reports that she is sleeping and eating well and voices no physical complaints. She denies SI, HI, AVH, paranoia, ideas of reference, or first rank symptoms. She does not make delusional statements today but appears pleasantly confused. She states she wants to discharge and does not understand why she is not going to the cafeteria. She was reminded she is having meals brought to her for help with portion control given her recent over-eating and vomiting. [name] perseverative about wanting to go to the cafeteria. She has trouble with orientation questions today. Her room is untidy with uneaten food on her bed, bags strewn on the floor, and personal hygiene products collected with papertowels on her bed. [name] wearing clothes with food stains but states clothes were just washed and reports she is showering. She was prompted to attend to ADLs and allow staff to assist with cleaning her room and clothes.  Principal Problem: Bipolar affective disorder, current episode manic with psychotic symptoms ([place]) Diagnosis: Principal Problem:   Bipolar affective disorder, current  episode manic with psychotic symptoms ([place]) Active Problems:   Major neurocognitive disorder ([place])  Past Psychiatric History: see H&P  Past Medical History:  Past Medical History:  Diagnosis Date   Bipolar 1 disorder with moderate mania ([place]) 12/30/2013   Hypertension    Family History: see H&P  Family Psychiatric  History: see H&P  Social History:  Homeless; mother is interim guardian   Sleep: Good  Appetite:  Good  Current Medications: Current Facility-Administered Medications  Medication Dose Route Frequency Provider Last Rate Last Admin   acetaminophen (TYLENOL) tablet 650 mg  650 mg Oral Q6H PRN Briant Cedar, MD   650 mg at 11/14/21 0759   alum & mag hydroxide-simeth (MAALOX/MYLANTA) 200-200-20 MG/5ML suspension 30 mL  30 mL Oral Q4H PRN Massengill, [name], MD       benztropine (COGENTIN) tablet 0.5 mg  0.5 mg Oral BID Nelda Marseille, Jailine Lieder E, MD   0.5 mg at 12/04/21 0845   docusate sodium (COLACE) capsule 100 mg  100 mg Oral BID Nelda Marseille, Lashea Goda E, MD   100 mg at 12/04/21 0846   donepezil (ARICEPT) tablet 5 mg  5 mg Oral Daily Nelda Marseille, Allyah Heather E, MD   5 mg at 12/04/21 0846   feeding supplement (KATE FARMS STANDARD 1.4) liquid 325 mL  325 mL Oral BID BM Massengill, [name], MD   325 mL at 12/02/21 0832   fluticasone (FLONASE) 50 MCG/ACT nasal spray 1 spray  1 spray Each Nare Daily PRN Harlow Asa, MD   1 spray at 11/30/21 2031   haloperidol (HALDOL) tablet 2 mg  2 mg Oral BID Harlow Asa, MD   2 mg at 12/04/21 [phone]  hydrocerin (EUCERIN) cream   Topical BID Janine Limbo, MD   Given at 12/02/21 1619   hydrOXYzine (ATARAX) tablet 25 mg  25 mg Oral TID PRN Janine Limbo, MD   25 mg at 12/03/21 2041   LORazepam (ATIVAN) tablet 1 mg  1 mg Oral Q6H PRN Harlow Asa, MD   1 mg at 10/07/21 1546   magnesium hydroxide (MILK OF MAGNESIA) suspension 30 mL  30 mL Oral Daily PRN Massengill, [name], MD   30 mL at 11/14/21 0802   nicotine (NICODERM CQ - dosed in mg/24  hours) patch 14 mg  14 mg Transdermal Daily Nelda Marseille, Osamah Schmader E, MD   14 mg at 12/04/21 0848   OLANZapine zydis (ZYPREXA) disintegrating tablet 5 mg  5 mg Oral Q8H PRN Harlow Asa, MD   5 mg at 10/14/21 1416   And   ziprasidone (GEODON) injection 20 mg  20 mg Intramuscular PRN Harlow Asa, MD       ondansetron [facility]) tablet 4 mg  4 mg Oral Q8H PRN Harlow Asa, MD   4 mg at 11/23/21 1287   polyethylene glycol (MIRALAX / GLYCOLAX) packet 17 g  17 g Oral Daily PRN Harlow Asa, MD       Lab Results:  No results found for this or any previous visit (from the past 52 hour(s)).     Blood Alcohol level:  Lab Results  Component Value Date   ETH <10 86/76/7209   Metabolic Disorder Labs: Lab Results  Component Value Date   HGBA1C 5.3 09/08/2021   MPG 105.41 09/08/2021   No results found for: "PROLACTIN" Lab Results  Component Value Date   CHOL 173 09/08/2021   TRIG 110 09/08/2021   HDL 50 09/08/2021   CHOLHDL 3.5 09/08/2021   VLDL 22 09/08/2021   LDLCALC 101 (H) 09/08/2021   LDLCALC 102 (H) 06/21/2020   Physical Findings:  AIMS: Facial and Oral Movements Muscles of Facial Expression: None, normal Lips and Perioral Area: None, normal Jaw: None, normal Tongue: None, normal,Extremity Movements Upper (arms, wrists, hands, fingers): None, normal Lower (legs, knees, ankles, toes): None, normal, Trunk Movements Neck, shoulders, hips: None, normal, Overall Severity Severity of abnormal movements (highest score from questions above): None, normal Incapacitation due to abnormal movements: None, normal Patient's awareness of abnormal movements (rate only patient's report): No Awareness, Dental Status Current problems with teeth and/or dentures?: No Does patient usually wear dentures?: No   Musculoskeletal: Strength & Muscle Tone:untested  Gait & Station: normal, steady Patient leans: N/A  Psychiatric Specialty Exam:  Presentation  General Appearance: unkempt  appearing  - wearing hair bonnet, dirty scrub top and layers of clothes  Eye Contact:Fair  Speech:rambling but normal fluency  Speech Volume:Normal  Mood and Affect  Mood:appears frustrated and mildly anxious  Affect: mildly anxious  Thought Process  Thought Processes: concrete, ruminative about discharge planning and desire to go to cafeteria; tangential  Orientation:Oriented to month, city, and Software engineer not date, state, or year and not for reason for admission  Thought Content: Does not make delusional statements today; is not grossly responding to internal/external stimuli on exam; denies SI, HI, AVH or paranoia and denies ideas of reference or first rank symptoms; ruminative about need for continued admission and desire to go to cafeteria   Hallucinations:Denied  Ideas of Reference:Denied  Suicidal Thoughts:Denied   Homicidal Thoughts:Denied   Sensorium  Memory:Poor  Judgment:Poor  Insight:Poor  Executive Functions  Concentration:Poor  Attention Span:Poor  Recall:Poor  Fund of Knowledge:Limited  Language:Fair  Psychomotor Activity  Psychomotor Activity:no restlessness, akathisia or tremors noted  Assets  Assets:Resilience  Sleep  5.5 hours  Physical Exam Vitals and nursing note reviewed.  Constitutional:      Appearance: Normal appearance.  HENT:     Head: Normocephalic.     Mouth/Throat:     Comments: No drooling noted Pulmonary:     Effort: Pulmonary effort is normal.  Neurological:     General: No focal deficit present.     Mental Status: [name] alert.    Review of Systems  Respiratory:  Negative for shortness of breath.   Cardiovascular:  Negative for chest pain.  Gastrointestinal:  Negative for abdominal pain, constipation, diarrhea, nausea and vomiting.  Neurological: Negative.  Negative for dizziness and headaches.     Blood pressure 119/75, pulse (!) 105, temperature 98.5 F (36.9 C), temperature source Oral, resp. rate 16,  height 5' 6"  (1.676 m), weight 72.6 kg, SpO2 99 %. Body mass index is 25.83 kg/m.  Treatment Plan Summary:  Diagnoses / Active Problems: Bipolar I MRE manic with psychotic features Major Neurocognitive d/o with behavioral disturbance  PLAN: Safety and Monitoring:  -- Voluntary admission (signed in by guardian for treatment on 11/19/21) to inpatient psychiatric unit for safety, stabilization and treatment  -- Daily contact with patient to assess and evaluate symptoms and progress in treatment  -- Patient's case to be discussed in multi-disciplinary team meeting  -- Observation Level : q15 minute checks  -- Vital signs:  q12 hours  -- Precautions: suicide, elopement, and assault  2. Psychiatric Diagnoses and Treatment:   Bipolar I MRE manic with psychotic features (r/o schizoaffective d/o - bipolar type) Major neurocognitive disorder with behavioral disturbance (marked for age brain atrophy on CT head and MOCA 11/30)   -Continue Haldol 8m bid - (had decompensation with attempt to reduce dose lower) - Continue Cogentin 0.5 mg bid (had drooling with previous attempt at dose reduction)   -Invega Sustenna 234 mg IM given on 09-30-21, 1571mIM given 10/12/20 - Invega 78 mg given on 7/21, reduced dose given due to CrCl of 54 after consultation with pharmacist regarding monthly maintenance dose and consent obtained from guardian for medication administration - will discuss options with guardian regarding continuation of Invega sustenna LAI vs discontinuation and attempt to maximize Haldol as a single agent -- Zyprexa/Geodon/ativan agitation protocol PRN   --Additional psychosis labs (HIV- , RPR-, ANA-, heavy metal WNL, ESR wnl, ceruloplasmin wnl and CT head shows Marked for age brain atrophy.  No acute or reversible finding.)   --Depakote, Ativan, and Zyprexa stopped -- ACTT referral pending -- Continue Aricept 44m77maily and monitoring for GI sx - could not complete repeat MOCA on 11/23/21 --  Interim guardianship in place with mother and waiting on SSDI and medicaid for group home referral  3. Medical Issues Being Addressed:   Tobacco Use Disorder  -- Nicotine patch 63m20m hours ordered and d/c nicotine gum    Hyperammonemia -resolved --Ammonia level 30 on 10/27/21 and 25 on 8/4 --Depakote was stopped    Mildly elevated creatinine - improving  --Creatinine of 1.05 on 8/4  -- Continue to encourage po fluids    Seasonal allergies --Restart Flonase PRN per guardian approval  Constipation - improved -- Continue colace 100mg22m and encouraged po fluids -- PRN Miralax for severe constipation  Nausea/Vomiting -intermittent -- SLP saw for swallow assessment and feels is cognitively based dysphagia - no limitations continue regular  diet and thin liquid  Tachycardia  -- Appears intermittent - asymptomatic - will monitor   4. Discharge Planning:   -- Social work and case management to assist with discharge planning and identification of hospital follow-up needs prior to discharge  -- Discharge Concerns: Need to establish a safety plan; Medication compliance and effectiveness; safe housing   -- Discharge Goals: ALF or group home with outpatient referrals for mental health follow-up including medication management/psychotherapy  Labs On Admission- CMP: WNL except Cr: 1.1,  GFR: 60,  CBC: WNL,  UDS: Neg,  Troponin: 3,  EtOH: Neg,  beta hCG: 6.4,  Resp Panel: Neg 5/29- Urine Preg: Neg,  A1c: 5.3,  TSH: 1.719,  Lipid Panel: WNL except LDL: 101 5/24- BMP: WNL except Cr: 1.01,  Ca: 8.4,  hCG: 5,  Urine Preg: Neg 5/28- Ammonia: 32,  CBC: WNL,  CMP: WNL except  Cr: 1.08,  Ca: 8.6,  Depakote lvl: 20 6/3- Depakote lvl: 64, Hep Function: WNL,  CBC: WNL except Hem: 11.9 6/7- Depakote lvl: 75 6/18- UA: Neg,  CBC: WNL,  Depakote lvl: 136,  Ammonia: 99,  BMP: WNL except BUN: 22,  Cr: 1.19,  Ca: 8.6,  GFR: 54,  Hep Function: WNL except Albu: 3.3,  Indirect Bili: 0.2 6/20- ANA: Neg,  Ammonia:  48,  Ceruloplasmin: 24.2,  CMP: WNL except BUN: 23,  Cr: 1.35,  Ca: 8.3,  Albu: 3.4  HIV:Neg,  RPR: Neg,  Sed Rate: 6,  Depakote lvl: 25,  Heavy Metal: Neg 6/23- Ammonia: 46,  BMP: WNL except Cr: 1.2,  Ca: 8.3,  GFR: 54 6/24- UA: WNL except Bacteria: rare 6/30- CMP:  WNL except Cr: 1.04,  Ca: 8.6,  CBC: WNL,  Ammonia: 41, EKG: NSR with Qtc: 399 7/8 - CMP WNL other than CR 1.33 and GFR 48; Ammonia 30 7/18 - Cr of 1.22, eGFR of 53 7/23 - EKG with Qtc of 422 7/31 - creatinine 1.08 8/1 - creatinine 1.24 8/3 - COVID negative 8/4 - CBC WNL, NH3 25, CMP WNL other than creatinine 1.05 and glucose 122, lipase WNL 8/6 - CXR negative and COVID negative  Harlow Asa, MD, FAPA 12/04/2021, 4:52 PM

## 2021-12-04 NOTE — Progress Notes (Signed)
Pt in room this morning. Pt has not showered or addressed ADL's. Pt remains oriented to person. Pt presents confused, attempting to wander off unit this afternoon stating "They said I could go! My doctor said I could go!". Pt declined nutritional supplement when offered. Pts thought process appears disorganized. Pt endorses depressive symptoms 6/10, denies SI/HI/self harm thoughts. Will monitor and assist with tx, ensure a safe milleu.

## 2021-12-04 NOTE — Progress Notes (Signed)
   12/04/21 2015  Psych Admission Type (Psych Patients Only)  Admission Status Involuntary  Psychosocial Assessment  Patient Complaints Confusion  Eye Contact Fair;Glaring  Facial Expression Flat  Affect Anxious  Speech Soft  Interaction Cautious  Motor Activity Slow  Appearance/Hygiene Poor hygiene  Behavior Characteristics Cooperative  Mood Preoccupied  Aggressive Behavior  Effect No apparent injury  Thought Process  Coherency Disorganized  Content Blaming others  Delusions WDL  Perception WDL  Hallucination None reported or observed  Judgment Impaired  Confusion Mild  Danger to Self  Current suicidal ideation? Denies  Danger to Others  Danger to Others None reported or observed

## 2021-12-05 ENCOUNTER — Encounter (HOSPITAL_COMMUNITY): Payer: Self-pay

## 2021-12-05 DIAGNOSIS — F312 Bipolar disorder, current episode manic severe with psychotic features: Secondary | ICD-10-CM | POA: Diagnosis not present

## 2021-12-05 LAB — BASIC METABOLIC PANEL
Anion gap: 10 (ref 5–15)
BUN: 19 mg/dL (ref 6–20)
CO2: 22 mmol/L (ref 22–32)
Calcium: 8.9 mg/dL (ref 8.9–10.3)
Chloride: 106 mmol/L (ref 98–111)
Creatinine, Ser: 1.42 mg/dL — ABNORMAL HIGH (ref 0.44–1.00)
GFR, Estimated: 44 mL/min — ABNORMAL LOW (ref >60)
Glucose, Bld: 146 mg/dL — ABNORMAL HIGH (ref 70–99)
Potassium: 3.4 mmol/L — ABNORMAL LOW (ref 3.5–5.1)
Sodium: 138 mmol/L (ref 135–145)

## 2021-12-05 MED ORDER — DOCUSATE SODIUM 100 MG PO CAPS
100.0000 mg | ORAL_CAPSULE | Freq: Every day | ORAL | Status: DC
  Administered 2021-12-06 – 2021-12-23 (×18): 100 mg via ORAL
  Filled 2021-12-05 (×19): qty 1

## 2021-12-05 NOTE — Progress Notes (Signed)
[facility] MD Progress Note  12/05/2021 1:00 PM Natalie Brown  MRN:  [id number]  Reason for Admission:  Natalie Brown is a 56 y.o. female  who was initially admitted for inpatient psychiatric hospitalization on 09/06/2021 for management of mania, disorganized behaviors/thinking, and delusions. The patient is currently on Hospital Day 90.   Chart Review from last 24 hours:  The patient's chart was reviewed and nursing notes were reviewed. The patient's case was discussed in multidisciplinary team meeting. Per nursing she did not receive qhs medications due to being too sedated. Yesterday she was confused, attempting to leave, and disorganized. She continues to put excessive food in her mouth and holds food in mouth. She has required prompts but is not attending to ADLs. She did not attend groups. Per MAR she was compliant with daytime medications except refusal of Eucerin.She did not require PRNs.  Information Obtained Today During Patient Interview: The patient was interviewed in her room. [name] ruminative about wanting to go to the cafeteria and was advised that we will attempt this today with a sitter to help her slow her eating, remember to swallow, and work on portion control of bites to reduce risk of vomiting and swallowing issues. She denies AVH, SI, HI, ideas of reference or first rank symptoms. She states she had some diarrhea this morning but denies other physical complaints. She denies medication side-effects. She reports good sleep. She was again prompted to wash her clothes and shower.   Principal Problem: Bipolar affective disorder, current episode manic with psychotic symptoms ([place]) Diagnosis: Principal Problem:   Bipolar affective disorder, current episode manic with psychotic symptoms ([place]) Active Problems:   Major neurocognitive disorder ([place])  Past Psychiatric History: see H&P  Past Medical History:  Past Medical History:  Diagnosis Date   Bipolar 1 disorder with moderate  mania ([place]) 12/30/2013   Hypertension    Family History: see H&P  Family Psychiatric  History: see H&P  Social History:  Homeless; mother is interim guardian   Sleep: Good 9 hours  Appetite:  Good  Current Medications: Current Facility-Administered Medications  Medication Dose Route Frequency Provider Last Rate Last Admin   acetaminophen (TYLENOL) tablet 650 mg  650 mg Oral Q6H PRN Briant Cedar, MD   650 mg at 11/14/21 0759   alum & mag hydroxide-simeth (MAALOX/MYLANTA) 200-200-20 MG/5ML suspension 30 mL  30 mL Oral Q4H PRN Massengill, [name], MD       benztropine (COGENTIN) tablet 0.5 mg  0.5 mg Oral BID Nelda Marseille, Caleel Kiner E, MD   0.5 mg at 12/05/21 0847   docusate sodium (COLACE) capsule 100 mg  100 mg Oral BID Nelda Marseille, [name] E, MD   100 mg at 12/05/21 0847   donepezil (ARICEPT) tablet 5 mg  5 mg Oral Daily Nelda Marseille, Maycol Hoying E, MD   5 mg at 12/05/21 0847   feeding supplement (KATE FARMS STANDARD 1.4) liquid 325 mL  325 mL Oral BID BM Massengill, [name], MD   325 mL at 12/02/21 0832   fluticasone (FLONASE) 50 MCG/ACT nasal spray 1 spray  1 spray Each Nare Daily PRN Harlow Asa, MD   1 spray at 11/30/21 2031   haloperidol (HALDOL) tablet 2 mg  2 mg Oral BID Harlow Asa, MD   2 mg at 12/05/21 4628   hydrocerin (EUCERIN) cream   Topical BID Janine Limbo, MD   Given at 12/02/21 1619   hydrOXYzine (ATARAX) tablet 25 mg  25 mg Oral TID PRN Janine Limbo,  MD   25 mg at 12/03/21 2041   LORazepam (ATIVAN) tablet 1 mg  1 mg Oral Q6H PRN Harlow Asa, MD   1 mg at 10/07/21 1546   magnesium hydroxide (MILK OF MAGNESIA) suspension 30 mL  30 mL Oral Daily PRN Massengill, [name], MD   30 mL at 11/14/21 0802   nicotine (NICODERM CQ - dosed in mg/24 hours) patch 14 mg  14 mg Transdermal Daily Nelda Marseille, Dorlisa Savino E, MD   14 mg at 12/05/21 0847   OLANZapine zydis (ZYPREXA) disintegrating tablet 5 mg  5 mg Oral Q8H PRN Harlow Asa, MD   5 mg at 10/14/21 1416   And    ziprasidone (GEODON) injection 20 mg  20 mg Intramuscular PRN Harlow Asa, MD       ondansetron [facility]) tablet 4 mg  4 mg Oral Q8H PRN Harlow Asa, MD   4 mg at 11/23/21 5329   polyethylene glycol (MIRALAX / GLYCOLAX) packet 17 g  17 g Oral Daily PRN Harlow Asa, MD       Lab Results:  No results found for this or any previous visit (from the past 12 hour(s)).     Blood Alcohol level:  Lab Results  Component Value Date   ETH <10 92/42/6834   Metabolic Disorder Labs: Lab Results  Component Value Date   HGBA1C 5.3 09/08/2021   MPG 105.41 09/08/2021   No results found for: "PROLACTIN" Lab Results  Component Value Date   CHOL 173 09/08/2021   TRIG 110 09/08/2021   HDL 50 09/08/2021   CHOLHDL 3.5 09/08/2021   VLDL 22 09/08/2021   LDLCALC 101 (H) 09/08/2021   LDLCALC 102 (H) 06/21/2020   Physical Findings:  AIMS: Facial and Oral Movements Muscles of Facial Expression: None, normal Lips and Perioral Area: None, normal Jaw: None, normal Tongue: None, normal,Extremity Movements Upper (arms, wrists, hands, fingers): None, normal Lower (legs, knees, ankles, toes): None, normal, Trunk Movements Neck, shoulders, hips: None, normal, Overall Severity Severity of abnormal movements (highest score from questions above): None, normal Incapacitation due to abnormal movements: None, normal Patient's awareness of abnormal movements (rate only patient's report): No Awareness, Dental Status Current problems with teeth and/or dentures?: No Does patient usually wear dentures?: No   Musculoskeletal: Strength & Muscle Tone:untested  Gait & Station: normal, steady Patient leans: N/A  Psychiatric Specialty Exam:  Presentation  General Appearance: unkempt appearing  - wearing hair bonnet, dirty scrub top and layers of clothes with food stains on pants and top  Eye Contact:Fair  Speech:rambling but normal fluency  Speech Volume:Normal  Mood and Affect  Mood:appears  mildly anxious  Affect: congruent  Thought Process  Thought Processes: concrete, ruminative about desire to go to cafeteria; tangential  Orientation:Oriented to month, city, but not year, date, place or situation  Thought Content: Does not make delusional statements today; is not grossly responding to internal/external stimuli on exam; denies SI, HI, AVH or paranoia and denies ideas of reference or first rank symptoms; ruminative about desire to go to cafeteria   Hallucinations:Denied  Ideas of Reference:Denied  Suicidal Thoughts:Denied   Homicidal Thoughts:Denied   Sensorium  Memory:Poor  Judgment:Poor  Insight:Poor  Executive Functions  Concentration:Poor  Attention Span:Poor  Recall:Poor  Fund of Knowledge:Limited  Language:Fair  Psychomotor Activity  Psychomotor Activity:no restlessness, akathisia or tremors noted  Assets  Assets:Resilience  Sleep  9 hours  Physical Exam Vitals and nursing note reviewed.  Constitutional:  Appearance: Normal appearance.  HENT:     Head: Normocephalic.     Mouth/Throat:     Comments: No drooling noted Pulmonary:     Effort: Pulmonary effort is normal.  Neurological:     General: No focal deficit present.     Mental Status: [name] alert.    Review of Systems  Respiratory:  Negative for shortness of breath.   Cardiovascular:  Negative for chest pain.  Gastrointestinal:  Positive for diarrhea. Negative for abdominal pain, constipation, nausea and vomiting.  Neurological: Negative.  Negative for dizziness and headaches.     Blood pressure (!) 144/86, pulse (!) 146, temperature 98.1 F (36.7 C), temperature source Oral, resp. rate 16, height 5' 6"  (1.676 m), weight 72.6 kg, SpO2 100 %. Body mass index is 25.83 kg/m.  Treatment Plan Summary:  Diagnoses / Active Problems: Bipolar I MRE manic with psychotic features Major Neurocognitive d/o with behavioral disturbance  PLAN: Safety and Monitoring:  --  Voluntary admission (signed in by guardian for treatment on 11/19/21) to inpatient psychiatric unit for safety, stabilization and treatment  -- Daily contact with patient to assess and evaluate symptoms and progress in treatment  -- Patient's case to be discussed in multi-disciplinary team meeting  -- Observation Level : q15 minute checks  -- Vital signs:  q12 hours  -- Precautions: suicide, elopement, and assault  2. Psychiatric Diagnoses and Treatment:   Bipolar I MRE manic with psychotic features (r/o schizoaffective d/o - bipolar type) Major neurocognitive disorder with behavioral disturbance (marked for age brain atrophy on CT head and MOCA 11/30)   -Continue Haldol 48m bid - (had decompensation with attempt to reduce dose lower) - Continue Cogentin 0.5 mg bid (had drooling with previous attempt at dose reduction)   -Invega Sustenna 234 mg IM given on 09-30-21, 1564mIM given 10/12/20 - Invega 78 mg given on 7/21, reduced dose given due to CrCl of 54 after consultation with pharmacist regarding monthly maintenance dose and consent obtained from guardian for medication administration - will discuss options with guardian regarding continuation of Invega sustenna LAI vs discontinuation and attempt to maximize Haldol as a single agent -- Zyprexa/Geodon/ativan agitation protocol PRN   --Additional psychosis labs (HIV- , RPR-, ANA-, heavy metal WNL, ESR wnl, ceruloplasmin wnl and CT head shows Marked for age brain atrophy.  No acute or reversible finding.)   --Depakote, Ativan, and Zyprexa stopped -- ACTT referral pending -- Continue Aricept 43m64maily and monitoring for GI sx - could not complete repeat MOCA on 11/23/21 -- Interim guardianship in place with mother and waiting on SSDI and medicaid for group home referral  3. Medical Issues Being Addressed:   Tobacco Use Disorder  -- Nicotine patch 22m443m hours ordered and d/c nicotine gum    Hyperammonemia -resolved --Ammonia level 30 on 10/27/21  and 25 on 8/4 --Depakote was stopped    Mildly elevated creatinine - improving  --Creatinine of 1.05 on 8/4  -- Continue to encourage po fluids    Seasonal allergies --Restart Flonase PRN per guardian approval  Constipation - improved -- Continue colace but reduce to qd dosing due to report of diarrhea today -- PRN Miralax for severe constipation  Nausea/Vomiting -intermittent -- SLP saw for swallow assessment and feels is cognitively based dysphagia - no limitations continue regular diet and thin liquid -- sitter for meals to help with swallowing/portion control   Tachycardia  -- Appears intermittent - asymptomatic - will monitor and rechecking resting HR today  4. Discharge Planning:   -- Social work and case management to assist with discharge planning and identification of hospital follow-up needs prior to discharge  -- Discharge Concerns: Need to establish a safety plan; Medication compliance and effectiveness; safe housing   -- Discharge Goals: ALF or group home with outpatient referrals for mental health follow-up including medication management/psychotherapy  Labs On Admission- CMP: WNL except Cr: 1.1,  GFR: 60,  CBC: WNL,  UDS: Neg,  Troponin: 3,  EtOH: Neg,  beta hCG: 6.4,  Resp Panel: Neg 5/29- Urine Preg: Neg,  A1c: 5.3,  TSH: 1.719,  Lipid Panel: WNL except LDL: 101 5/24- BMP: WNL except Cr: 1.01,  Ca: 8.4,  hCG: 5,  Urine Preg: Neg 5/28- Ammonia: 32,  CBC: WNL,  CMP: WNL except  Cr: 1.08,  Ca: 8.6,  Depakote lvl: 20 6/3- Depakote lvl: 64, Hep Function: WNL,  CBC: WNL except Hem: 11.9 6/7- Depakote lvl: 75 6/18- UA: Neg,  CBC: WNL,  Depakote lvl: 136,  Ammonia: 99,  BMP: WNL except BUN: 22,  Cr: 1.19,  Ca: 8.6,  GFR: 54,  Hep Function: WNL except Albu: 3.3,  Indirect Bili: 0.2 6/20- ANA: Neg,  Ammonia: 48,  Ceruloplasmin: 24.2,  CMP: WNL except BUN: 23,  Cr: 1.35,  Ca: 8.3,  Albu: 3.4  HIV:Neg,  RPR: Neg,  Sed Rate: 6,  Depakote lvl: 25,  Heavy Metal: Neg 6/23-  Ammonia: 46,  BMP: WNL except Cr: 1.2,  Ca: 8.3,  GFR: 54 6/24- UA: WNL except Bacteria: rare 6/30- CMP:  WNL except Cr: 1.04,  Ca: 8.6,  CBC: WNL,  Ammonia: 41, EKG: NSR with Qtc: 399 7/8 - CMP WNL other than CR 1.33 and GFR 48; Ammonia 30 7/18 - Cr of 1.22, eGFR of 53 7/23 - EKG with Qtc of 422 7/31 - creatinine 1.08 8/1 - creatinine 1.24 8/3 - COVID negative 8/4 - CBC WNL, NH3 25, CMP WNL other than creatinine 1.05 and glucose 122, lipase WNL 8/6 - CXR negative and COVID negative  Harlow Asa, MD, FAPA 12/05/2021, 1:00 PM

## 2021-12-05 NOTE — Progress Notes (Signed)
   12/05/21 1100  Psych Admission Type (Psych Patients Only)  Admission Status Involuntary  Psychosocial Assessment  Patient Complaints Worrying  Eye Contact Glaring  Facial Expression Anxious;Worried;Wide-eyed  Affect Anxious;Inconsistent with thought content;Preoccupied  Speech Slow;Soft  Interaction Intrusive;Needy  Motor Activity Fidgety;Slow;Shuffling  Appearance/Hygiene Disheveled;Layered clothes  Behavior Characteristics Anxious;Intrusive  Mood Anxious  Aggressive Behavior  Effect No apparent injury  Thought Process  Coherency Disorganized  Content Blaming others  Delusions None reported or observed  Perception WDL  Hallucination None reported or observed  Judgment Impaired  Confusion Mild  Danger to Self  Current suicidal ideation? Denies  Agreement Not to Harm Self Yes  Description of Agreement Verbal  Danger to Others  Danger to Others None reported or observed  Danger to Others Abnormal  Harmful Behavior to others No threats or harm toward other people

## 2021-12-05 NOTE — Progress Notes (Signed)
   12/05/21 2100  Psych Admission Type (Psych Patients Only)  Admission Status Involuntary  Psychosocial Assessment  Patient Complaints Worrying;Confusion  Eye Contact Fair;Glaring  Facial Expression Flat  Affect Anxious  Speech Soft  Interaction Cautious  Motor Activity Slow  Appearance/Hygiene Poor hygiene  Behavior Characteristics Anxious  Mood Anxious  Aggressive Behavior  Effect No apparent injury  Thought Process  Coherency Disorganized  Content Blaming others  Delusions WDL  Perception WDL  Hallucination None reported or observed  Judgment Impaired  Confusion Mild  Danger to Self  Current suicidal ideation? Denies  Danger to Others  Danger to Others None reported or observed

## 2021-12-05 NOTE — Plan of Care (Signed)
I talked to patient's mother/guardian. We discussed treatment options since her monthly Invega LAI is due on 8/18.I advised that our attempts to taper off Haldol and onto Invega LAI as monotherapy have been unsuccessful since the patient decompensated with past attempts to reduce her oral Haldol dose below 4mg /day. I discussed option of holding Invega LAI and trying to maximize po Haldol as a single agent as an alternative option for monotherapy. I discussed that Haldol does also come in LAI form if needed in the future. I advised that we will not know if she decompensates with the discontinuation of Invega LAI unless we trying holding the medication and observing. The r/b/se/a to Haldol and Invega were again reviewed, and her mother is concerned about potential increased side-effects with long-term use of a first generation antipsychotic. We also discussed option of continuing her present regimen with both Invega LAI and oral Haldol at lowest effective doses in combination since she is presently stable on this regimen.   After discussion of options, her mother states she would like to continue the patient on her present regimen including Invega LAI and current Haldol 2mg  bid due to her present stability. Mother states that she hopes we can again try to taper down in the future on oral Haldol dosing and aim toward monotherapy if tolerated at a later time. I advised that we will recheck her renal function if she will cooperate with labs and will renally adjust Invega LAI dose based on creatinine clearance. We will continue current Haldol 2mg  bid with the hope of tapering down further on Haldol dose in the future as tolerated. Time was given for questions.   , MD, 

## 2021-12-05 NOTE — Progress Notes (Signed)
[facility] Group Notes:  (Nursing/MHT/Case Management/Adjunct)  Date:  12/05/2021  Time:  2000  Type of Therapy:   wrap up group  Participation Level:  Active  Participation Quality:  Attentive  Affect:  Flat  Cognitive:  Lacking  Insight:  Limited  Engagement in Group:  Lacking  Modes of Intervention:  Clarification, Education, and Support  Summary of Progress/Problems: Positive thinking and positive change were discussed.   Marcille Buffy 12/05/2021, 8:49 PM

## 2021-12-05 NOTE — BH IP Treatment Plan (Signed)
Interdisciplinary Treatment and Diagnostic Plan Update  12/05/2021 Time of Session: 10:40am  Natalie Brown MRN: [id number]  Principal Diagnosis: Bipolar affective disorder, current episode manic with psychotic symptoms (HCC)  Secondary Diagnoses: Principal Problem:   Bipolar affective disorder, current episode manic with psychotic symptoms (HCC) Active Problems:   Major neurocognitive disorder (HCC)   Current Medications:  Current Facility-Administered Medications  Medication Dose Route Frequency Provider Last Rate Last Admin   acetaminophen (TYLENOL) tablet 650 mg  650 mg Oral Q6H PRN Lauro Franklin, MD   650 mg at 11/14/21 0759   alum & mag hydroxide-simeth (MAALOX/MYLANTA) 200-200-20 MG/5ML suspension 30 mL  30 mL Oral Q4H PRN Massengill, Harrold Donath, MD       benztropine (COGENTIN) tablet 0.5 mg  0.5 mg Oral BID Mason Jim, Amy E, MD   0.5 mg at 12/05/21 0847   docusate sodium (COLACE) capsule 100 mg  100 mg Oral BID Bartholomew Crews E, MD   100 mg at 12/05/21 0847   donepezil (ARICEPT) tablet 5 mg  5 mg Oral Daily Bartholomew Crews E, MD   5 mg at 12/05/21 0847   feeding supplement (KATE FARMS STANDARD 1.4) liquid 325 mL  325 mL Oral BID BM Massengill, Harrold Donath, MD   325 mL at 12/02/21 0832   fluticasone (FLONASE) 50 MCG/ACT nasal spray 1 spray  1 spray Each Nare Daily PRN Comer Locket, MD   1 spray at 11/30/21 2031   haloperidol (HALDOL) tablet 2 mg  2 mg Oral BID Comer Locket, MD   2 mg at 12/05/21 3710   hydrocerin (EUCERIN) cream   Topical BID Phineas Inches, MD   Given at 12/02/21 1619   hydrOXYzine (ATARAX) tablet 25 mg  25 mg Oral TID PRN Phineas Inches, MD   25 mg at 12/03/21 2041   LORazepam (ATIVAN) tablet 1 mg  1 mg Oral Q6H PRN Comer Locket, MD   1 mg at 10/07/21 1546   magnesium hydroxide (MILK OF MAGNESIA) suspension 30 mL  30 mL Oral Daily PRN Massengill, Harrold Donath, MD   30 mL at 11/14/21 0802   nicotine (NICODERM CQ - dosed in mg/24 hours) patch 14 mg   14 mg Transdermal Daily Mason Jim, Amy E, MD   14 mg at 12/05/21 0847   OLANZapine zydis (ZYPREXA) disintegrating tablet 5 mg  5 mg Oral Q8H PRN Bartholomew Crews E, MD   5 mg at 10/14/21 1416   And   ziprasidone (GEODON) injection 20 mg  20 mg Intramuscular PRN Comer Locket, MD       ondansetron (ZOFRAN) tablet 4 mg  4 mg Oral Q8H PRN Bartholomew Crews E, MD   4 mg at 11/23/21 6269   polyethylene glycol (MIRALAX / GLYCOLAX) packet 17 g  17 g Oral Daily PRN Comer Locket, MD       PTA Medications: Medications Prior to Admission  Medication Sig Dispense Refill Last Dose   CLARITIN 10 MG tablet Take 10 mg by mouth daily.      fluticasone (FLONASE) 50 MCG/ACT nasal spray Place 1 spray into both nostrils daily.      pseudoephedrine-guaifenesin (MUCINEX D) 60-600 MG 12 hr tablet Take 1 tablet by mouth every 12 (twelve) hours. 30 tablet 0     Patient Stressors: Health problems   Medication change or noncompliance    Patient Strengths: Capable of independent living  Printmaker for treatment/growth  Supportive family/friends   Treatment Modalities: Medication Management, Group therapy,  Case management,  1 to 1 session with clinician, Psychoeducation, Recreational therapy.   Physician Treatment Plan for Primary Diagnosis: Bipolar affective disorder, current episode manic with psychotic symptoms (HCC) Long Term Goal(s): Improvement in symptoms so as ready for discharge   Short Term Goals: Ability to identify and develop effective coping behaviors will improve Ability to maintain clinical measurements within normal limits will improve Compliance with prescribed medications will improve Ability to identify changes in lifestyle to reduce recurrence of condition will improve Ability to verbalize feelings will improve Ability to disclose and discuss suicidal ideas Ability to demonstrate self-control will improve  Medication Management: Evaluate patient's response, side  effects, and tolerance of medication regimen.  Therapeutic Interventions: 1 to 1 sessions, Unit Group sessions and Medication administration.  Evaluation of Outcomes: Progressing  Physician Treatment Plan for Secondary Diagnosis: Principal Problem:   Bipolar affective disorder, current episode manic with psychotic symptoms (HCC) Active Problems:   Major neurocognitive disorder (HCC)  Long Term Goal(s): Improvement in symptoms so as ready for discharge   Short Term Goals: Ability to identify and develop effective coping behaviors will improve Ability to maintain clinical measurements within normal limits will improve Compliance with prescribed medications will improve Ability to identify changes in lifestyle to reduce recurrence of condition will improve Ability to verbalize feelings will improve Ability to disclose and discuss suicidal ideas Ability to demonstrate self-control will improve     Medication Management: Evaluate patient's response, side effects, and tolerance of medication regimen.  Therapeutic Interventions: 1 to 1 sessions, Unit Group sessions and Medication administration.  Evaluation of Outcomes: Progressing   RN Treatment Plan for Primary Diagnosis: Bipolar affective disorder, current episode manic with psychotic symptoms (HCC) Long Term Goal(s): Knowledge of disease and therapeutic regimen to maintain health will improve  Short Term Goals: Ability to remain free from injury will improve, Ability to participate in decision making will improve, Ability to verbalize feelings will improve, Ability to disclose and discuss suicidal ideas, and Ability to identify and develop effective coping behaviors will improve  Medication Management: RN will administer medications as ordered by provider, will assess and evaluate patient's response and provide education to patient for prescribed medication. RN will report any adverse and/or side effects to prescribing  provider.  Therapeutic Interventions: 1 on 1 counseling sessions, Psychoeducation, Medication administration, Evaluate responses to treatment, Monitor vital signs and CBGs as ordered, Perform/monitor CIWA, COWS, AIMS and Fall Risk screenings as ordered, Perform wound care treatments as ordered.  Evaluation of Outcomes: Progressing   LCSW Treatment Plan for Primary Diagnosis: Bipolar affective disorder, current episode manic with psychotic symptoms (HCC) Long Term Goal(s): Safe transition to appropriate next level of care at discharge, Engage patient in therapeutic group addressing interpersonal concerns.  Short Term Goals: Engage patient in aftercare planning with referrals and resources, Increase social support, Increase emotional regulation, Facilitate acceptance of mental health diagnosis and concerns, Identify triggers associated with mental health/substance abuse issues, and Increase skills for wellness and recovery  Therapeutic Interventions: Assess for all discharge needs, 1 to 1 time with Social worker, Explore available resources and support systems, Assess for adequacy in community support network, Educate family and significant other(s) on suicide prevention, Complete Psychosocial Assessment, Interpersonal group therapy.  Evaluation of Outcomes: Progressing   Progress in Treatment: Attending groups: Yes. Participating in groups: Yes. Taking medication as prescribed: Yes. Toleration medication: Yes. Family/Significant other contact made: Yes, individual(s) contacted:  SPE completed w/ patient's legal guardian/mother [name]. Patient understands diagnosis: No. Discussing patient  identified problems/goals with staff: Yes. Medical problems stabilized or resolved: Yes. Denies suicidal/homicidal ideation: Yes. Issues/concerns per patient self-inventory: Yes. Other: none   New problem(s) identified: No, Describe:  none   New Short Term/Long Term Goal(s): Patient to work  towards elimination of symptoms of psychosis, medication management for mood stabilization;  development of comprehensive mental wellness plan.   Patient Goals:  No additional goals identified at this time. Patient to continue to work towards original goals identified in initial treatment team meeting. CSW will remain available to patient should they voice additional treatment goals.    Discharge Plan or Barriers: Patient continues to lack adequate discharge location. CSW continues to monitor patient disability status in order to get patient placed at group home.    Reason for Continuation of Hospitalization: Other; describe psychosis.    Estimated Length of Stay:1-7 days     Last 3 [place] Suicide Severity Risk Score: Flowsheet Row Ad[facility] (Current) [facility] from [facility]05/2021 in [facility] ED from 05/26/2020 in [facility]  C-SSRS RISK CATEGORY High Risk High Risk No Risk       Last PHQ 2/9 Scores:    05/26/2020    2:14 PM 09/03/2017    4:34 PM 08/07/2016    2:12 PM  Depression screen PHQ 2/9  Decreased Interest 3 3 3   Down, Depressed, Hopeless 3 1 2   PHQ - 2 Score 6 4 5   Altered sleeping 0 3 3  Tired, decreased energy 0 3 0  Change in appetite 3 3 0  Feeling bad or failure about yourself  0 3 1  Trouble concentrating 3 0 0  Moving slowly or fidgety/restless 3 0 1  Suicidal thoughts 0 0 0  PHQ-9 Score 15 16 10     Scribe for Treatment Team: , 12/05/2021 11:04 AM

## 2021-12-05 NOTE — Progress Notes (Signed)
Pt HS medications not given due to pt being too lethargic

## 2021-12-06 ENCOUNTER — Other Ambulatory Visit (HOSPITAL_COMMUNITY): Payer: Self-pay

## 2021-12-06 DIAGNOSIS — F312 Bipolar disorder, current episode manic severe with psychotic features: Secondary | ICD-10-CM | POA: Diagnosis not present

## 2021-12-06 MED ORDER — PALIPERIDONE PALMITATE ER 78 MG/0.5ML IM SUSY
78.0000 mg | PREFILLED_SYRINGE | Freq: Once | INTRAMUSCULAR | Status: DC
  Filled 2021-12-06: qty 78

## 2021-12-06 MED ORDER — PALIPERIDONE PALMITATE ER 78 MG/0.5ML IM SUSY: 78.0000 mg | PREFILLED_SYRINGE | Freq: Once | INTRAMUSCULAR | Status: DC

## 2021-12-06 MED ORDER — PALIPERIDONE PALMITATE ER 78 MG/0.5ML IM SUSY
78.0000 mg | PREFILLED_SYRINGE | Freq: Once | INTRAMUSCULAR | Status: DC
Start: 2021-12-07 — End: 2021-12-07
  Filled 2021-12-06 (×2): qty 78

## 2021-12-06 MED ORDER — POTASSIUM CHLORIDE CRYS ER 20 MEQ PO TBCR
20.0000 meq | EXTENDED_RELEASE_TABLET | Freq: Two times a day (BID) | ORAL | Status: DC
  Filled 2021-12-06 (×2): qty 1

## 2021-12-06 MED ORDER — POTASSIUM CHLORIDE 20 MEQ PO PACK
20.0000 meq | PACK | Freq: Two times a day (BID) | ORAL | Status: AC
  Administered 2021-12-06 (×2): 20 meq via ORAL
  Filled 2021-12-06 (×2): qty 1

## 2021-12-06 NOTE — Progress Notes (Addendum)
[facility] Group Notes:  (Nursing/MHT/Case Management/Adjunct)  Date:  12/06/2021  Time:  2000  Type of Therapy:   wrap up group  Participation Level:  Active  Participation Quality:  Attentive and Sharing  Affect:  Blunted  Cognitive:  Confused and Lacking  Insight:  Lacking  Engagement in Group:  Engaged  Modes of Intervention:  Clarification, Education, and Socialization  Summary of Progress/Problems: Positive thinking and self-care were discussed. Pt answers prompts but takes awhile to come up with answer.   Marcille Buffy 12/06/2021, 8:42 PM

## 2021-12-06 NOTE — Progress Notes (Signed)
[facility] [facility] MD Progress Note  12/06/2021 7:51 AM Natalie Brown  MRN:  [id number]  Reason for Admission:  Natalie Brown is a 56 y.o. female  who was initially admitted for inpatient psychiatric hospitalization on 09/06/2021 for management of mania, disorganized behaviors/thinking, and delusions. The patient is currently on Hospital Day 91.   Chart Review from last 24 hours:  The patient's chart was reviewed and nursing notes were reviewed. The patient's case was discussed in multidisciplinary team meeting. Per nursing, she did well with 1:1 for meals in terms of portion control and po intake. She was anxious and disorganized on the unit but attended groups. Per MAR she was compliant with scheduled medications except Eucerin cream and did receive Flonase X1 and Vistaril X1 PRN yesterday.  Information Obtained Today During Patient Interview: The patient was interviewed in her room. [name] resting in bed and states she is sleeping well. She reports good appetite and denies any further GI issues. She specifically denies N/V/D/C. She voices no physical complaints today and was again prompted to attend to her ADLs. She claims she is showering but appears unkempt with dirty clothes on again. She denies SI, HI, AVH, paranoia, first rank symptoms or ideas of reference. She does not make delusional statements unprompted today and is no longer perseverative on discharge plans or unit rules. I reminded her she is due for her Lorayne Bender sustenna injection on Friday and advised that her K+ is low and her kidney function shows she is not drinking enough fluids. She was encouraged to increase po hydration and allow repeat lab again tomorrow.   Principal Problem: Bipolar affective disorder, current episode manic with psychotic symptoms ([place]) Diagnosis: Principal Problem:   Bipolar affective disorder, current episode manic with psychotic symptoms ([place]) Active Problems:   Major neurocognitive disorder ([place])  Past  Psychiatric History: see H&P  Past Medical History:  Past Medical History:  Diagnosis Date   Bipolar 1 disorder with moderate mania ([place]) 12/30/2013   Hypertension    Family History: see H&P  Family Psychiatric  History: see H&P  Social History:  Homeless; mother is interim guardian   Sleep: Fair - 5 hours  Appetite:  Good  Current Medications: Current Facility-Administered Medications  Medication Dose Route Frequency Provider Last Rate Last Admin   acetaminophen (TYLENOL) tablet 650 mg  650 mg Oral Q6H PRN Briant Cedar, MD   650 mg at 11/14/21 0759   alum & mag hydroxide-simeth (MAALOX/MYLANTA) 200-200-20 MG/5ML suspension 30 mL  30 mL Oral Q4H PRN Massengill, [name], MD       benztropine (COGENTIN) tablet 0.5 mg  0.5 mg Oral BID Nelda Marseille, Gregary Blackard E, MD   0.5 mg at 12/05/21 1644   docusate sodium (COLACE) capsule 100 mg  100 mg Oral Daily Estiben Mizuno E, MD       donepezil (ARICEPT) tablet 5 mg  5 mg Oral Daily Nelda Marseille, Adhvik Canady E, MD   5 mg at 12/05/21 0847   feeding supplement (KATE FARMS STANDARD 1.4) liquid 325 mL  325 mL Oral BID BM Massengill, [name], MD   325 mL at 12/02/21 0832   fluticasone (FLONASE) 50 MCG/ACT nasal spray 1 spray  1 spray Each Nare Daily PRN Harlow Asa, MD   1 spray at 12/05/21 1646   haloperidol (HALDOL) tablet 2 mg  2 mg Oral BID Harlow Asa, MD   2 mg at 12/05/21 2046   hydrocerin (EUCERIN) cream   Topical BID Janine Limbo, MD  Given at 12/02/21 1619   hydrOXYzine (ATARAX) tablet 25 mg  25 mg Oral TID PRN Janine Limbo, MD   25 mg at 12/05/21 2046   LORazepam (ATIVAN) tablet 1 mg  1 mg Oral Q6H PRN Harlow Asa, MD   1 mg at 10/07/21 1546   magnesium hydroxide (MILK OF MAGNESIA) suspension 30 mL  30 mL Oral Daily PRN Janine Limbo, MD   30 mL at 11/14/21 0802   nicotine (NICODERM CQ - dosed in mg/24 hours) patch 14 mg  14 mg Transdermal Daily Nelda Marseille, Jayen Bromwell E, MD   14 mg at 12/05/21 0847   OLANZapine zydis  (ZYPREXA) disintegrating tablet 5 mg  5 mg Oral Q8H PRN Harlow Asa, MD   5 mg at 10/14/21 1416   And   ziprasidone (GEODON) injection 20 mg  20 mg Intramuscular PRN Harlow Asa, MD       ondansetron [facility]) tablet 4 mg  4 mg Oral Q8H PRN Harlow Asa, MD   4 mg at 11/23/21 3220   polyethylene glycol (MIRALAX / GLYCOLAX) packet 17 g  17 g Oral Daily PRN Harlow Asa, MD       potassium chloride (KLOR-CON) packet 20 mEq  20 mEq Oral BID Dian Situ, MD       Lab Results:  Results for orders placed or performed during the hospital encounter of 09/06/21 (from the past 48 hour(s))  Basic metabolic panel     Status: Abnormal   Collection Time: 12/05/21  6:19 PM  Result Value Ref Range   Sodium 138 135 - 145 mmol/L   Potassium 3.4 (L) 3.5 - 5.1 mmol/L   Chloride 106 98 - 111 mmol/L   CO2 22 22 - 32 mmol/L   Glucose, Bld 146 (H) 70 - 99 mg/dL    Comment: Glucose reference range applies only to samples taken after fasting for at least 8 hours.   BUN 19 6 - 20 mg/dL   Creatinine, Ser 1.42 (H) 0.44 - 1.00 mg/dL   Calcium 8.9 8.9 - 10.3 mg/dL   GFR, Estimated 44 (L) >60 mL/min    Comment: (NOTE) Calculated using the CKD-EPI Creatinine Equation (2021)    Anion gap 10 5 - 15    Comment: Performed at [facility], [place] [address], [place], [place] [address]    Blood Alcohol level:  Lab Results  Component Value Date   ETH <10 10/13/7626   Metabolic Disorder Labs: Lab Results  Component Value Date   HGBA1C 5.3 09/08/2021   MPG 105.41 09/08/2021   No results found for: "PROLACTIN" Lab Results  Component Value Date   CHOL 173 09/08/2021   TRIG 110 09/08/2021   HDL 50 09/08/2021   CHOLHDL 3.5 09/08/2021   VLDL 22 09/08/2021   LDLCALC 101 (H) 09/08/2021   LDLCALC 102 (H) 06/21/2020   Physical Findings:  AIMS: Facial and Oral Movements Muscles of Facial Expression: None, normal Lips and Perioral Area: None, normal Jaw: None, normal Tongue:  None, normal,Extremity Movements Upper (arms, wrists, hands, fingers): None, normal Lower (legs, knees, ankles, toes): None, normal, Trunk Movements Neck, shoulders, hips: None, normal, Overall Severity Severity of abnormal movements (highest score from questions above): None, normal Incapacitation due to abnormal movements: None, normal Patient's awareness of abnormal movements (rate only patient's report): No Awareness, Dental Status Current problems with teeth and/or dentures?: No Does patient usually wear dentures?: No   Musculoskeletal: Strength & Muscle Tone:untested  Gait & Station:  untested in bed Patient leans: N/A  Psychiatric Specialty Exam:  Presentation  General Appearance: unkempt appearing  - wearing hair bonnet, dirty scrub top   Eye Contact:Good  Speech:less rambling, normal fluency  Speech Volume:Normal  Mood and Affect  Mood:appears mildly anxious and confused  Affect: congruent  Thought Process  Thought Processes: concrete, less ruminative today  Orientation:Oriented to month, city, but not year, date, place or situation  Thought Content: Does not make delusional statements today; is not grossly responding to internal/external stimuli on exam; denies SI, HI, AVH or paranoia and denies ideas of reference or first rank symptoms  Hallucinations:Denied  Ideas of Reference:Denied  Suicidal Thoughts:Denied   Homicidal Thoughts:Denied   Sensorium  Memory:Poor  Judgment:Poor  Insight:Poor  Executive Functions  Concentration:Poor  Attention Span:Poor  Recall:Poor  Fund of Knowledge:Limited  Language:Fair  Psychomotor Activity  Psychomotor Activity:no restlessness, akathisia or tremors noted  Assets  Assets:Resilience  Physical Exam Vitals and nursing note reviewed.  Constitutional:      Appearance: Normal appearance.  HENT:     Head: Normocephalic.     Mouth/Throat:     Comments: No drooling noted Pulmonary:     Effort:  Pulmonary effort is normal.  Neurological:     General: No focal deficit present.     Mental Status: [name] alert.    Review of Systems  Respiratory:  Negative for shortness of breath.   Cardiovascular:  Negative for chest pain.  Gastrointestinal:  Negative for abdominal pain, constipation, diarrhea, nausea and vomiting.  Neurological: Negative.  Negative for dizziness and headaches.   Blood pressure (!) 135/96, pulse (!) 109, temperature 97.8 F (36.6 C), temperature source Oral, resp. rate 16, height _0  (1.676 m), weight 72.6 kg, SpO2 100 %. Body mass index is 25.83 kg/m.  Treatment Plan Summary:  Diagnoses / Active Problems: Bipolar I MRE manic with psychotic features Major Neurocognitive d/o with behavioral disturbance  PLAN: Safety and Monitoring:  -- Voluntary admission (signed in by guardian for treatment on 11/19/21) to inpatient psychiatric unit for safety, stabilization and treatment  -- Daily contact with patient to assess and evaluate symptoms and progress in treatment  -- Patient's case to be discussed in multi-disciplinary team meeting  -- Observation Level : q15 minute checks  -- Vital signs:  q12 hours  -- Precautions: suicide, elopement, and assault  2. Psychiatric Diagnoses and Treatment:   Bipolar I MRE manic with psychotic features (r/o schizoaffective d/o - bipolar type) Major neurocognitive disorder with behavioral disturbance (marked for age brain atrophy on CT head and MOCA 11/30)   -Continue Haldol 40m bid - (discussed with guardian and will continue present dose with hope of attempting to taper off in future if tolerated) - Continue Cogentin 0.5 mg bid  -Invega Sustenna 234 mg IM given on 09-30-21, 1586mIM given 10/12/20 - Invega 78 mg given on 7/21; due for next monthly injection 8/18 and discussed options with guardian who wishes to proceed with next monthly IM injection with dose based on renal clearance  -- Zyprexa/Geodon/ativan agitation protocol  PRN   --Additional psychosis labs (HIV- , RPR-, ANA-, heavy metal WNL, ESR wnl, ceruloplasmin wnl and CT head shows Marked for age brain atrophy.  No acute or reversible finding.)   --Depakote, Ativan, and Zyprexa stopped -- ACTT referral pending -- Continue Aricept 108m49maily for memory issues -- Interim guardianship in place with mother and waiting on SSDI and medicaid for group home referral  3. Medical Issues Being Addressed:  Tobacco Use Disorder  -- Nicotine patch 54m/24 hours ordered and d/c nicotine gum    Hyperammonemia -resolved --Ammonia level 30 on 10/27/21 and 25 on 8/4 --Depakote was stopped     Elevated creatinine  --Creatinine of 1.42 on 8/16 -- Continue to encourage po fluids and trend     Seasonal allergies --Restart Flonase PRN per guardian approval  Constipation - improved -- Continue colace 1054mqd dosing  -- PRN Miralax for severe constipation  Nausea/Vomiting -intermittent -- SLP saw for swallow assessment and feels is cognitively based dysphagia - no limitations continue regular diet and thin liquid -- sitter for meals to help with swallowing/portion control   Tachycardia  -- Appears intermittent - asymptomatic - will monitor and encourage po hydration  Hypokalemia  - Giving K+ 20 meq bid today and repeating BMP tomorrow for trending  4. Discharge Planning:   -- Social work and case management to assist with discharge planning and identification of hospital follow-up needs prior to discharge  -- Discharge Concerns: Need to establish a safety plan; Medication compliance and effectiveness; safe housing   -- Discharge Goals: ALF or group home with outpatient referrals for mental health follow-up including medication management/psychotherapy  Labs On Admission- CMP: WNL except Cr: 1.1,  GFR: 60,  CBC: WNL,  UDS: Neg,  Troponin: 3,  EtOH: Neg,  beta hCG: 6.4,  Resp Panel: Neg 5/29- Urine Preg: Neg,  A1c: 5.3,  TSH: 1.719,  Lipid Panel: WNL except LDL:  101 5/24- BMP: WNL except Cr: 1.01,  Ca: 8.4,  hCG: 5,  Urine Preg: Neg 5/28- Ammonia: 32,  CBC: WNL,  CMP: WNL except  Cr: 1.08,  Ca: 8.6,  Depakote lvl: 20 6/3- Depakote lvl: 64, Hep Function: WNL,  CBC: WNL except Hem: 11.9 6/7- Depakote lvl: 75 6/18- UA: Neg,  CBC: WNL,  Depakote lvl: 136,  Ammonia: 99,  BMP: WNL except BUN: 22,  Cr: 1.19,  Ca: 8.6,  GFR: 54,  Hep Function: WNL except Albu: 3.3,  Indirect Bili: 0.2 6/20- ANA: Neg,  Ammonia: 48,  Ceruloplasmin: 24.2,  CMP: WNL except BUN: 23,  Cr: 1.35,  Ca: 8.3,  Albu: 3.4  HIV:Neg,  RPR: Neg,  Sed Rate: 6,  Depakote lvl: 25,  Heavy Metal: Neg 6/23- Ammonia: 46,  BMP: WNL except Cr: 1.2,  Ca: 8.3,  GFR: 54 6/24- UA: WNL except Bacteria: rare 6/30- CMP:  WNL except Cr: 1.04,  Ca: 8.6,  CBC: WNL,  Ammonia: 41, EKG: NSR with Qtc: 399 7/8 - CMP WNL other than CR 1.33 and GFR 48; Ammonia 30 7/18 - Cr of 1.22, eGFR of 53 7/23 - EKG with Qtc of 422 7/31 - creatinine 1.08 8/1 - creatinine 1.24 8/3 - COVID negative 8/4 - CBC WNL, NH3 25, CMP WNL other than creatinine 1.05 and glucose 122, lipase WNL 8/6 - CXR negative and COVID negative 8/16 - Creatinine 1.42 with GFR 44, glucose 146 and K+ 3.4 otherwise BMP WNL  AmHarlow AsaMD, FAPA 12/06/2021, 7:51 AM

## 2021-12-06 NOTE — Progress Notes (Signed)
   12/06/21 0515  Sleep  Number of Hours 5

## 2021-12-06 NOTE — Progress Notes (Signed)
   12/06/21 2130  Psych Admission Type (Psych Patients Only)  Admission Status Involuntary  Psychosocial Assessment  Patient Complaints Worrying;Confusion  Eye Contact Fair;Glaring  Facial Expression Flat  Affect Anxious  Speech Soft  Interaction Cautious  Motor Activity Slow  Appearance/Hygiene Poor hygiene  Behavior Characteristics Cooperative  Mood Preoccupied;Pleasant  Aggressive Behavior  Effect No apparent injury  Thought Process  Coherency Disorganized  Content Blaming others  Delusions WDL  Perception WDL  Hallucination None reported or observed  Judgment Impaired  Confusion Mild  Danger to Self  Current suicidal ideation? Denies  Danger to Others  Danger to Others None reported or observed

## 2021-12-06 NOTE — Group Note (Signed)
Occupational Therapy Group Note   Group Topic:Goal Setting  Group Date: 12/06/2021 Start Time: 1400 End Time: 1455 Facilitators: Ted Mcalpine, OT   Group Description: Group encouraged engagement and participation through discussion focused on goal setting. Group members were introduced to goal-setting using the SMART Goal framework, identifying goals as Specific, Measureable, Acheivable, Relevant, and Time-Bound. Group members took time from group to create their own personal goal reflecting the SMART goal template and shared for review by peers and OT.    Therapeutic Goal(s):  Identify at least one goal that fits the SMART framework    Participation Level: Minimal   Participation Quality: Minimal Cues   Behavior: Calm and Cooperative   Speech/Thought Process: Disorganized   Affect/Mood: Flat   Insight: Impaired   Judgement: Impaired   Individualization: pt was present and passively engaged in their participation of group discussion/activity. New skills were identified  Modes of Intervention: Discussion and Education  Patient Response to Interventions:  Attentive and Receptive   Plan: Continue to engage patient in OT groups 2 - 3x/week.  12/06/2021  Ted Mcalpine, OT Kerrin Champagne, OT

## 2021-12-07 DIAGNOSIS — F312 Bipolar disorder, current episode manic severe with psychotic features: Secondary | ICD-10-CM | POA: Diagnosis not present

## 2021-12-07 LAB — BASIC METABOLIC PANEL
Anion gap: 7 (ref 5–15)
BUN: 13 mg/dL (ref 6–20)
CO2: 24 mmol/L (ref 22–32)
Calcium: 9.2 mg/dL (ref 8.9–10.3)
Chloride: 112 mmol/L — ABNORMAL HIGH (ref 98–111)
Creatinine, Ser: 1.19 mg/dL — ABNORMAL HIGH (ref 0.44–1.00)
GFR, Estimated: 54 mL/min — ABNORMAL LOW (ref >60)
Glucose, Bld: 110 mg/dL — ABNORMAL HIGH (ref 70–99)
Potassium: 4 mmol/L (ref 3.5–5.1)
Sodium: 143 mmol/L (ref 135–145)

## 2021-12-07 MED ORDER — PALIPERIDONE PALMITATE ER 78 MG/0.5ML IM SUSY
78.0000 mg | PREFILLED_SYRINGE | Freq: Once | INTRAMUSCULAR | Status: AC
  Administered 2021-12-07: 78 mg via INTRAMUSCULAR
  Filled 2021-12-07: qty 78

## 2021-12-07 MED ORDER — PALIPERIDONE PALMITATE ER 78 MG/0.5ML IM SUSY: 78.0000 mg | PREFILLED_SYRINGE | Freq: Once | INTRAMUSCULAR | Status: DC

## 2021-12-07 NOTE — Progress Notes (Signed)
[facility] MD Progress Note  12/07/2021 8:00 AM Natalie Brown  MRN:  [id number]  Reason for Admission:  Natalie Brown is a 56 y.o. female  who was initially admitted for inpatient psychiatric hospitalization on 09/06/2021 for management of mania, disorganized behaviors/thinking, and delusions. The patient is currently on Hospital Day 92.   Chart Review from last 24 hours:  The patient's chart was reviewed and nursing notes were reviewed. The patient's case was discussed in multidisciplinary team meeting. Per nursing, she continues to have poor hygiene and disorganized thinking. She attended and passively engaged in groups. Per MAR, she was compliant with medications and did receive Vistaril X1 PRN. She did comply with labs.  Information Obtained Today During Patient Interview: The patient was interviewed in her room. [name] unkempt and wearing dirty clothes but was willing to allow me to help her change her top today with prompts. She states she is sleeping and eating well and voices no physical complaints. [name] ruminative about desire for discharge and states she visited with her sister yesterday. She specifically denies issues with N/V/D/C. She denies SI, HI, AVH, first rank symptoms, paranoia or ideas of reference. She does not make delusional statements unprompted today but appears confused. [name] able to follow simple directions.  Principal Problem: Bipolar affective disorder, current episode manic with psychotic symptoms ([place]) Diagnosis: Principal Problem:   Bipolar affective disorder, current episode manic with psychotic symptoms ([place]) Active Problems:   Major neurocognitive disorder ([place])  Past Psychiatric History: see H&P  Past Medical History:  Past Medical History:  Diagnosis Date   Bipolar 1 disorder with moderate mania ([place]) 12/30/2013   Hypertension    Family History: see H&P  Family Psychiatric  History: see H&P  Social History:  Homeless; mother is interim  guardian   Sleep: Fair - 7 hours  Appetite:  Good  Current Medications: Current Facility-Administered Medications  Medication Dose Route Frequency Provider Last Rate Last Admin   acetaminophen (TYLENOL) tablet 650 mg  650 mg Oral Q6H PRN Briant Cedar, MD   650 mg at 11/14/21 0759   alum & mag hydroxide-simeth (MAALOX/MYLANTA) 200-200-20 MG/5ML suspension 30 mL  30 mL Oral Q4H PRN Massengill, [name], MD       benztropine (COGENTIN) tablet 0.5 mg  0.5 mg Oral BID Nelda Marseille, Mekaela Azizi E, MD   0.5 mg at 12/06/21 1720   docusate sodium (COLACE) capsule 100 mg  100 mg Oral Daily Nelda Marseille, Tegan Burnside E, MD   100 mg at 12/06/21 0816   donepezil (ARICEPT) tablet 5 mg  5 mg Oral Daily Nelda Marseille, Jashaun Penrose E, MD   5 mg at 12/06/21 0816   feeding supplement (KATE FARMS STANDARD 1.4) liquid 325 mL  325 mL Oral BID BM Massengill, Nathan, MD   325 mL at 12/06/21 1419   fluticasone (FLONASE) 50 MCG/ACT nasal spray 1 spray  1 spray Each Nare Daily PRN Harlow Asa, MD   1 spray at 12/05/21 1646   haloperidol (HALDOL) tablet 2 mg  2 mg Oral BID Harlow Asa, MD   2 mg at 12/06/21 2102   hydrocerin (EUCERIN) cream   Topical BID Janine Limbo, MD   Given at 12/06/21 1720   hydrOXYzine (ATARAX) tablet 25 mg  25 mg Oral TID PRN Janine Limbo, MD   25 mg at 12/06/21 2102   LORazepam (ATIVAN) tablet 1 mg  1 mg Oral Q6H PRN Harlow Asa, MD   1 mg at 10/07/21 1546  magnesium hydroxide (MILK OF MAGNESIA) suspension 30 mL  30 mL Oral Daily PRN Massengill, [name], MD   30 mL at 11/14/21 0802   nicotine (NICODERM CQ - dosed in mg/24 hours) patch 14 mg  14 mg Transdermal Daily Nelda Marseille, Stone Spirito E, MD   14 mg at 12/06/21 0819   OLANZapine zydis (ZYPREXA) disintegrating tablet 5 mg  5 mg Oral Q8H PRN Harlow Asa, MD   5 mg at 10/14/21 1416   And   ziprasidone (GEODON) injection 20 mg  20 mg Intramuscular PRN Harlow Asa, MD       ondansetron [facility]) tablet 4 mg  4 mg Oral Q8H PRN Harlow Asa, MD    4 mg at 11/23/21 3154   Paliperidone ER (INVEGA SUSTENNA) injection 78 mg  78 mg Intramuscular Once Nelda Marseille, Eri Mcevers E, MD       polyethylene glycol (MIRALAX / GLYCOLAX) packet 17 g  17 g Oral Daily PRN Harlow Asa, MD       Lab Results:  Results for orders placed or performed during the hospital encounter of 09/06/21 (from the past 48 hour(s))  Basic metabolic panel     Status: Abnormal   Collection Time: 12/05/21  6:19 PM  Result Value Ref Range   Sodium 138 135 - 145 mmol/L   Potassium 3.4 (L) 3.5 - 5.1 mmol/L   Chloride 106 98 - 111 mmol/L   CO2 22 22 - 32 mmol/L   Glucose, Bld 146 (H) 70 - 99 mg/dL    Comment: Glucose reference range applies only to samples taken after fasting for at least 8 hours.   BUN 19 6 - 20 mg/dL   Creatinine, Ser 1.42 (H) 0.44 - 1.00 mg/dL   Calcium 8.9 8.9 - 10.3 mg/dL   GFR, Estimated 44 (L) >60 mL/min    Comment: (NOTE) Calculated using the CKD-EPI Creatinine Equation (2021)    Anion gap 10 5 - 15    Comment: Performed at [facility], [place] [address], [place], [place] [address]  Basic metabolic panel     Status: Abnormal   Collection Time: 12/07/21  6:54 AM  Result Value Ref Range   Sodium 143 135 - 145 mmol/L   Potassium 4.0 3.5 - 5.1 mmol/L   Chloride 112 (H) 98 - 111 mmol/L   CO2 24 22 - 32 mmol/L   Glucose, Bld 110 (H) 70 - 99 mg/dL    Comment: Glucose reference range applies only to samples taken after fasting for at least 8 hours.   BUN 13 6 - 20 mg/dL   Creatinine, Ser 1.19 (H) 0.44 - 1.00 mg/dL   Calcium 9.2 8.9 - 10.3 mg/dL   GFR, Estimated 54 (L) >60 mL/min    Comment: (NOTE) Calculated using the CKD-EPI Creatinine Equation (2021)    Anion gap 7 5 - 15    Comment: Performed at [facility], [place] [address], [place], [place] [address]    Blood Alcohol level:  Lab Results  Component Value Date   ETH <10 93/26/7124   Metabolic Disorder Labs: Lab Results  Component Value Date   HGBA1C  5.3 09/08/2021   MPG 105.41 09/08/2021   No results found for: "PROLACTIN" Lab Results  Component Value Date   CHOL 173 09/08/2021   TRIG 110 09/08/2021   HDL 50 09/08/2021   CHOLHDL 3.5 09/08/2021   VLDL 22 09/08/2021   LDLCALC 101 (H) 09/08/2021   LDLCALC 102 (H) 06/21/2020  Physical Findings:  AIMS: Facial and Oral Movements Muscles of Facial Expression: None, normal Lips and Perioral Area: None, normal Jaw: None, normal Tongue: None, normal,Extremity Movements Upper (arms, wrists, hands, fingers): None, normal Lower (legs, knees, ankles, toes): None, normal, Trunk Movements Neck, shoulders, hips: None, normal, Overall Severity Severity of abnormal movements (highest score from questions above): None, normal Incapacitation due to abnormal movements: None, normal Patient's awareness of abnormal movements (rate only patient's report): No Awareness, Dental Status Current problems with teeth and/or dentures?: No Does patient usually wear dentures?: No   Musculoskeletal: Strength & Muscle Tone:untested  Gait & Station: untested in bed Patient leans: N/A  Psychiatric Specialty Exam:  Presentation  General Appearance: unkempt appearing  - wearing hair bonnet, dirty scrub top - changed into clean t-shirt  Eye Contact:Fair  Speech: speaks in short phrases to direct questions  Speech Volume:Normal  Mood and Affect  Mood:appears mildly anxious and confused  Affect: calm, polite, confused appearing  Thought Process  Thought Processes: concrete, some ruminations about desire for discharge  Orientation:Oriented to month, city, but not year, date, place or situation  Thought Content: Does not make delusional statements today; is not grossly responding to internal/external stimuli on exam; denies SI, HI, AVH or paranoia and denies ideas of reference or first rank symptoms  Hallucinations:Denied  Ideas of Reference:Denied  Suicidal Thoughts:Denied   Homicidal  Thoughts:Denied   Sensorium  Memory:Poor  Judgment:Poor  Insight:Poor  Executive Functions  Concentration:Poor  Attention Span:Poor  Recall:Poor  Fund of Knowledge:Limited  Language:Fair  Psychomotor Activity  Psychomotor Activity:no restlessness, akathisia or tremors noted  Assets  Assets:Resilience  Physical Exam Vitals and nursing note reviewed.  Constitutional:      Appearance: Normal appearance.  HENT:     Head: Normocephalic.     Mouth/Throat:     Comments: Mild drooling noted a times Pulmonary:     Effort: Pulmonary effort is normal.  Neurological:     General: No focal deficit present.     Mental Status: [name] alert.    Review of Systems  Respiratory:  Negative for shortness of breath.   Cardiovascular:  Negative for chest pain.  Gastrointestinal:  Negative for abdominal pain, constipation, diarrhea, nausea and vomiting.  Neurological: Negative.  Negative for dizziness and headaches.   Blood pressure 126/87, pulse (!) 120, temperature 98 F (36.7 C), temperature source Oral, resp. rate 16, height 5' 6"  (1.676 m), weight 72.6 kg, SpO2 100 %. Body mass index is 25.83 kg/m.  Treatment Plan Summary:  Diagnoses / Active Problems: Bipolar I MRE manic with psychotic features Major Neurocognitive d/o with behavioral disturbance  PLAN: Safety and Monitoring:  -- Voluntary admission (signed in by guardian for treatment on 11/19/21) to inpatient psychiatric unit for safety, stabilization and treatment  -- Daily contact with patient to assess and evaluate symptoms and progress in treatment  -- Patient's case to be discussed in multi-disciplinary team meeting  -- Observation Level : q15 minute checks  -- Vital signs:  q12 hours  -- Precautions: suicide, elopement, and assault  2. Psychiatric Diagnoses and Treatment:   Bipolar I MRE manic with psychotic features (r/o schizoaffective d/o - bipolar type) Major neurocognitive disorder with behavioral  disturbance (marked for age brain atrophy on CT head and MOCA 11/30)   -Continue Haldol 51m bid - (discussed with guardian and will continue present dose with hope of attempting to taper off in future if tolerated) - Continue Cogentin 0.5 mg bid  -Invega Sustenna 234 mg  IM given on 09-30-21, 141m IM given 10/12/20 - Invega 78 mg given on 7/21; Invega [id number] with consent obtained by guardian   -- Zyprexa/Geodon/ativan agitation protocol PRN   --Additional psychosis labs (HIV- , RPR-, ANA-, heavy metal WNL, ESR wnl, ceruloplasmin wnl and CT head shows Marked for age brain atrophy.  No acute or reversible finding.)   --Depakote, Ativan, and Zyprexa stopped -- ACTT referral pending -- Continue Aricept 25m37maily for memory issues -- Interim guardianship in place with mother and waiting on SSDI and medicaid for group home referral  3. Medical Issues Being Addressed:   Tobacco Use Disorder  -- Nicotine patch 61m3m hours ordered and d/c nicotine gum    Hyperammonemia -resolved --Ammonia level 30 on 10/27/21 and 25 on 8/4 --Depakote was stopped     Elevated creatinine  --Creatinine of 1.42 on 8/16 - on repeat today 1.19 with GFR 54 -- Continue to encourage po fluids and trend     Seasonal allergies --Restart Flonase PRN per guardian approval  Constipation - improved -- Continue colace 100mg1mdosing  -- PRN Miralax for severe constipation  Nausea/Vomiting -intermittent -- SLP saw for swallow assessment and feels is cognitively based dysphagia - no limitations continue regular diet and thin liquid -- sitter for meals to help with swallowing/portion control   Tachycardia  -- Appears intermittent - asymptomatic - will monitor and encourage po hydration - recheck resting HR today  Hypokalemia  - Received K+ 20 meq bid and repeat BMP today shows K+ 4.0  4. Discharge Planning:   -- Social work and case management to assist with discharge planning and identification of hospital  follow-up needs prior to discharge  -- Discharge Concerns: Need to establish a safety plan; Medication compliance and effectiveness; safe housing   -- Discharge Goals: ALF or group home with outpatient referrals for mental health follow-up including medication management/psychotherapy  Labs On Admission- CMP: WNL except Cr: 1.1,  GFR: 60,  CBC: WNL,  UDS: Neg,  Troponin: 3,  EtOH: Neg,  beta hCG: 6.4,  Resp Panel: Neg 5/29- Urine Preg: Neg,  A1c: 5.3,  TSH: 1.719,  Lipid Panel: WNL except LDL: 101 5/24- BMP: WNL except Cr: 1.01,  Ca: 8.4,  hCG: 5,  Urine Preg: Neg 5/28- Ammonia: 32,  CBC: WNL,  CMP: WNL except  Cr: 1.08,  Ca: 8.6,  Depakote lvl: 20 6/3- Depakote lvl: 64, Hep Function: WNL,  CBC: WNL except Hem: 11.9 6/7- Depakote lvl: 75 6/18- UA: Neg,  CBC: WNL,  Depakote lvl: 136,  Ammonia: 99,  BMP: WNL except BUN: 22,  Cr: 1.19,  Ca: 8.6,  GFR: 54,  Hep Function: WNL except Albu: 3.3,  Indirect Bili: 0.2 6/20- ANA: Neg,  Ammonia: 48,  Ceruloplasmin: 24.2,  CMP: WNL except BUN: 23,  Cr: 1.35,  Ca: 8.3,  Albu: 3.4  HIV:Neg,  RPR: Neg,  Sed Rate: 6,  Depakote lvl: 25,  Heavy Metal: Neg 6/23- Ammonia: 46,  BMP: WNL except Cr: 1.2,  Ca: 8.3,  GFR: 54 6/24- UA: WNL except Bacteria: rare 6/30- CMP:  WNL except Cr: 1.04,  Ca: 8.6,  CBC: WNL,  Ammonia: 41, EKG: NSR with Qtc: 399 7/8 - CMP WNL other than CR 1.33 and GFR 48; Ammonia 30 7/18 - Cr of 1.22, eGFR of 53 7/23 - EKG with Qtc of 422 7/31 - creatinine 1.08 8/1 - creatinine 1.24 8/3 - COVID negative 8/4 - CBC WNL, NH3 25, CMP WNL other than creatinine  1.05 and glucose 122, lipase WNL 8/6 - CXR negative and COVID negative 8/16 - Creatinine 1.42 with GFR 44, glucose 146 and K+ 3.4 otherwise BMP WNL 8/17 - Creatinine 1.19 with GFR 54, glucose 110, and CL 112 and K+ 4.0  Harlow Asa, MD, FAPA 12/07/2021, 8:00 AM

## 2021-12-07 NOTE — Progress Notes (Signed)
   12/07/21 0515  Sleep  Number of Hours 7

## 2021-12-07 NOTE — Progress Notes (Signed)
   12/07/21 1700  Psych Admission Type (Psych Patients Only)  Admission Status Involuntary  Psychosocial Assessment  Patient Complaints Worrying;Confusion  Eye Contact Fair;Glaring  Facial Expression Anxious;Flat;Wide-eyed;Worried  Affect Anxious  Chartered loss adjuster Cautious  Motor Activity Slow  Appearance/Hygiene Poor hygiene  Behavior Characteristics Cooperative  Mood Anxious;Preoccupied  Aggressive Behavior  Effect No apparent injury  Thought Process  Coherency Disorganized  Content Blaming others  Delusions None reported or observed  Perception WDL  Hallucination None reported or observed  Judgment Impaired  Confusion Mild  Danger to Self  Current suicidal ideation? Denies  Agreement Not to Harm Self Yes  Description of Agreement Verbal  Danger to Others  Danger to Others None reported or observed  Danger to Others Abnormal  Harmful Behavior to others No threats or harm toward other people  Destructive Behavior No threats or harm toward property

## 2021-12-07 NOTE — Group Note (Signed)
LCSW Group Therapy Note   Group Date: 09/05/2021 Start Time: 1300 End Time: 1400   Type of Therapy and Topic:  Group Therapy: Wise Mind  Participation Level:  Did not attend  Description of Group: Group members were presented with topic about wise mind, reasonable mind, and emotional mind.  Group members asked to identify situations were they used their wise mind, reasonable mind and emotional mind.  Members asked to identify how behaviors affected them after using parts of their mind and identified alternative behaviors they can use when they are in crisis.    Therapeutic Goals:  1. Patients will identify consequences of using emotional mind and rational mind.  2. Patients will engage in discussion on how they cope when they are in crisis 3.  Patients will discuss coping mechanisms to engage their wise mind     Summary of Patient Progress:  Did not attend    Therapeutic Modalities:  DBT Solution focused therapy    Natalie Sleeper Shannelle Alguire, LCSW 09/05/2021  1:41 PM

## 2021-12-07 NOTE — Progress Notes (Signed)
   12/07/21 2100  Psych Admission Type (Psych Patients Only)  Admission Status Involuntary  Psychosocial Assessment  Patient Complaints Worrying  Eye Contact Fair;Glaring  Facial Expression Flat  Affect Anxious  Speech Soft  Interaction Cautious  Motor Activity Slow  Appearance/Hygiene Poor hygiene  Behavior Characteristics Cooperative  Mood Anxious  Aggressive Behavior  Effect No apparent injury  Thought Process  Coherency Disorganized  Content Blaming others  Delusions WDL  Perception WDL  Hallucination None reported or observed  Judgment Impaired  Confusion Mild  Danger to Self  Current suicidal ideation? Denies  Danger to Others  Danger to Others None reported or observed

## 2021-12-07 NOTE — [facility] Group Notes (Signed)
Adult Psychoeducational Group Note  Date:  12/07/2021 Time:  9:24 AM  Group Topic/Focus:  Goals Group:   The focus of this group is to help patients establish daily goals to achieve during treatment and discuss how the patient can incorporate goal setting into their daily lives to aide in recovery.  Participation Level:  Did Not Attend  [facility] 12/07/2021, 9:24 AM

## 2021-12-07 NOTE — Progress Notes (Signed)
Adult Psychoeducational Group Note  Date:  12/07/2021 Time:  9:20 PM  Group Topic/Focus:  Wrap-Up Group:   The focus of this group is to help patients review their daily goal of treatment and discuss progress on daily workbooks.  Participation Level:  Active  Participation Quality:  Appropriate  Affect:  Appropriate  Cognitive:  Appropriate  Insight: Appropriate  Engagement in Group:  Engaged  Modes of Intervention:  Discussion  Additional Comments:   Pt actively participated in the Wrap Up group. Pt rated her day "almost a 10/10". Pt  did not identify her personal goal however stated that she hasn't made progress. Pt stated she enjoyed the visit with her mother. Pt expressed interest in being discharged from treatment soon and finding housing. Pt reportedly plans to practice patience, make friends, and take her medicine to help improve coping.  Natalie Brown 12/07/2021, 9:20 PM

## 2021-12-07 NOTE — [facility] Group Notes (Signed)
Spirituality group facilitated by Kathleen Argue, [facility].   Group Description: Group focused on topic of hope. Patients participated in facilitated discussion around topic, connecting with one another around experiences and definitions for hope. Group members engaged with visual explorer photos, reflecting on what hope looks like for them today. Group engaged in discussion around how their definitions of hope are present today in hospital.   Modalities: Psycho-social ed, Adlerian, Narrative, MI   Patient Progress: Natalie Brown was in and out of group.  She participated and showed some insight in her comments.  [address], [facility] Pager, [phone]

## 2021-12-08 DIAGNOSIS — F312 Bipolar disorder, current episode manic severe with psychotic features: Secondary | ICD-10-CM | POA: Diagnosis not present

## 2021-12-08 NOTE — Progress Notes (Signed)
   12/08/21 0515  Sleep  Number of Hours 7

## 2021-12-08 NOTE — Progress Notes (Signed)
   12/08/21 2130  Psych Admission Type (Psych Patients Only)  Admission Status Involuntary  Psychosocial Assessment  Patient Complaints Worrying;Confusion  Eye Contact Fair;Glaring  Facial Expression Flat  Affect Anxious  Speech Soft  Interaction Cautious  Motor Activity Slow  Appearance/Hygiene Poor hygiene  Behavior Characteristics Cooperative  Mood Anxious;Suspicious;Preoccupied;Pleasant  Aggressive Behavior  Effect No apparent injury  Thought Process  Coherency Disorganized  Content Blaming others  Delusions WDL  Perception WDL  Hallucination None reported or observed  Judgment Impaired  Confusion Mild  Danger to Self  Current suicidal ideation? Denies  Danger to Others  Danger to Others None reported or observed

## 2021-12-08 NOTE — [facility] Group Notes (Signed)
Goals Group 12/08/2021   Group Focus: affirmation, clarity of thought, and goals/reality orientation Treatment Modality:  Psychoeducation Interventions utilized were assignment, group exercise, and support Purpose: To be able to understand and verbalize the reason for their admission to the hospital. To understand that the medication helps with their chemical imbalance but they also need to work on their choices in life. To be challenged to develop Brown list of 30 positives about themselves. Also introduce the concept that "feelings" are not reality.  Participation Level:  did not attend  Natalie Brown 

## 2021-12-08 NOTE — Progress Notes (Signed)
   12/08/21 1200  Psych Admission Type (Psych Patients Only)  Admission Status Involuntary  Psychosocial Assessment  Patient Complaints Worrying;Confusion  Eye Contact Fair;Glaring  Facial Expression Anxious;Flat;Wide-eyed;Worried  Affect Anxious  Chartered loss adjuster Cautious  Motor Activity Slow  Appearance/Hygiene Poor hygiene  Behavior Characteristics Cooperative  Mood Anxious;Preoccupied  Aggressive Behavior  Effect No apparent injury  Thought Process  Coherency Disorganized  Content Blaming others  Delusions None reported or observed  Perception WDL  Hallucination None reported or observed  Judgment Impaired  Confusion Mild  Danger to Self  Current suicidal ideation? Denies  Agreement Not to Harm Self Yes  Description of Agreement Verbal  Danger to Others  Danger to Others None reported or observed  Danger to Others Abnormal  Harmful Behavior to others No threats or harm toward other people  Destructive Behavior No threats or harm toward property

## 2021-12-08 NOTE — Group Note (Signed)
  [facility] LCSW Group Therapy Note  Date/Time:  12/08/2021   Type of Therapy and Topic:  Group Therapy:  Feelings About Hospitalization  Participation Level:  Did Not Attend   Description of Group This process group involved patients discussing their feelings related to being hospitalized, as well as the benefits they see to being in the hospital.  These feelings and benefits were itemized.  The group then brainstormed specific ways in which they could seek those same benefits when they discharge and return home.  Therapeutic Goals Patient will identify and describe positive and negative feelings related to hospitalization Patient will verbalize benefits of hospitalization to themselves personally Patients will brainstorm together ways they can obtain similar benefits in the outpatient setting, identify barriers to wellness and possible solutions  Summary of Patient Progress:  The patient did not attend.  Therapeutic Modalities Cognitive Behavioral Therapy Motivational Interviewing    [place], [place] 12/08/2021, 11:41 AM

## 2021-12-08 NOTE — Progress Notes (Signed)
[facility] MD Progress Note  12/08/2021 7:55 AM Natalie Brown  MRN:  [id number]  Reason for Admission:  Natalie Brown is a 56 y.o. female  who was initially admitted for inpatient psychiatric hospitalization on 09/06/2021 for management of mania, disorganized behaviors/thinking, and delusions. The patient is currently on Hospital Day 93.   Chart Review from last 24 hours:  The patient's chart was reviewed and nursing notes were reviewed. The patient's case was discussed in multidisciplinary team meeting. Per nursing, she attended some groups but remained disorganized, worried, and confused with poor hygiene. She had no acute behavioral issues noted. Per MAR she was compliant with scheduled medications and did receive Vistaril X1 PRN  Information Obtained Today During Patient Interview: The patient was interviewed in her room. She states she is sleeping and eating well. [name] ruminative today about why she has been in the hospital so long and why she cannot be discharged. She states she wants to "sneak and secretly stay with my sister" when she gets out. She was reminded she has a guardian who does not feel this is a good plan and she was reminded we are waiting on SSI so we can get her medicaid and see about placement options. She states she moved her bowels today and denies GI sx. She voices no physical complaints. She specifically denies CP, SOB, palpitations or dizziness and was encouraged to fluid hydrate. She denies SI, HI, AVH, paranoia, first rank symptoms or ideas of reference. She appears pleasantly confused on exam and remains unkempt appearing with a cluttered room. She does not make delusional statements today but continues to have no insight into her mental health issues. Resting HR manually 98 on my exam.  Principal Problem: Bipolar affective disorder, current episode manic with psychotic symptoms ([place]) Diagnosis: Principal Problem:   Bipolar affective disorder, current episode manic with  psychotic symptoms ([place]) Active Problems:   Major neurocognitive disorder (Natalie)  Past Psychiatric History: see H&P  Past Medical History:  Past Medical History:  Diagnosis Date   Bipolar 1 disorder with moderate mania ([place]) 12/30/2013   Hypertension    Family History: see H&P  Family Psychiatric  History: see H&P  Social History:  Homeless; mother is interim guardian   Sleep: Good - 7 hours  Appetite:  Good  Current Medications: Current Facility-Administered Medications  Medication Dose Route Frequency Provider Last Rate Last Admin   acetaminophen (TYLENOL) tablet 650 mg  650 mg Oral Q6H PRN Briant Cedar, MD   650 mg at 11/14/21 0759   alum & mag hydroxide-simeth (MAALOX/MYLANTA) 200-200-20 MG/5ML suspension 30 mL  30 mL Oral Q4H PRN Massengill, [name], MD       benztropine (COGENTIN) tablet 0.5 mg  0.5 mg Oral BID Nelda Marseille, Lolly Glaus E, MD   0.5 mg at 12/07/21 1826   docusate sodium (COLACE) capsule 100 mg  100 mg Oral Daily Nelda Marseille, Zyasia Halbleib E, MD   100 mg at 12/07/21 0820   donepezil (ARICEPT) tablet 5 mg  5 mg Oral Daily Nelda Marseille, Delita Chiquito E, MD   5 mg at 12/07/21 0820   feeding supplement (KATE FARMS STANDARD 1.4) liquid 325 mL  325 mL Oral BID BM Massengill, Nathan, MD   325 mL at 12/07/21 1456   fluticasone (FLONASE) 50 MCG/ACT nasal spray 1 spray  1 spray Each Nare Daily PRN Harlow Asa, MD   1 spray at 12/05/21 1646   haloperidol (HALDOL) tablet 2 mg  2 mg Oral BID Nelda Marseille, Monie Shere  E, MD   2 mg at 12/07/21 2047   hydrocerin (EUCERIN) cream   Topical BID Janine Limbo, MD   Given at 12/07/21 1826   hydrOXYzine (ATARAX) tablet 25 mg  25 mg Oral TID PRN Janine Limbo, MD   25 mg at 12/07/21 2047   LORazepam (ATIVAN) tablet 1 mg  1 mg Oral Q6H PRN Harlow Asa, MD   1 mg at 10/07/21 1546   magnesium hydroxide (MILK OF MAGNESIA) suspension 30 mL  30 mL Oral Daily PRN Janine Limbo, MD   30 mL at 11/14/21 0802   nicotine (NICODERM CQ - dosed in mg/24 hours)  patch 14 mg  14 mg Transdermal Daily Nelda Marseille, Omero Kowal E, MD   14 mg at 12/07/21 0821   OLANZapine zydis (ZYPREXA) disintegrating tablet 5 mg  5 mg Oral Q8H PRN Harlow Asa, MD   5 mg at 10/14/21 1416   And   ziprasidone (GEODON) injection 20 mg  20 mg Intramuscular PRN Harlow Asa, MD       ondansetron [facility]) tablet 4 mg  4 mg Oral Q8H PRN Harlow Asa, MD   4 mg at 11/23/21 7893   polyethylene glycol (MIRALAX / GLYCOLAX) packet 17 g  17 g Oral Daily PRN Harlow Asa, MD       Lab Results:  Results for orders placed or performed during the hospital encounter of 09/06/21 (from the past 48 hour(s))  Basic metabolic panel     Status: Abnormal   Collection Time: 12/07/21  6:54 AM  Result Value Ref Range   Sodium 143 135 - 145 mmol/L   Potassium 4.0 3.5 - 5.1 mmol/L   Chloride 112 (H) 98 - 111 mmol/L   CO2 24 22 - 32 mmol/L   Glucose, Bld 110 (H) 70 - 99 mg/dL    Comment: Glucose reference range applies only to samples taken after fasting for at least 8 hours.   BUN 13 6 - 20 mg/dL   Creatinine, Ser 1.19 (H) 0.44 - 1.00 mg/dL   Calcium 9.2 8.9 - 10.3 mg/dL   GFR, Estimated 54 (L) >60 mL/min    Comment: (NOTE) Calculated using the CKD-EPI Creatinine Equation (2021)    Anion gap 7 5 - 15    Comment: Performed at [facility], [place] [address] [place], [place] [address]    Blood Alcohol level:  Lab Results  Component Value Date   ETH <10 51/05/5850   Metabolic Disorder Labs: Lab Results  Component Value Date   HGBA1C 5.3 09/08/2021   MPG 105.41 09/08/2021   No results found for: "PROLACTIN" Lab Results  Component Value Date   CHOL 173 09/08/2021   TRIG 110 09/08/2021   HDL 50 09/08/2021   CHOLHDL 3.5 09/08/2021   VLDL 22 09/08/2021   LDLCALC 101 (H) 09/08/2021   LDLCALC 102 (H) 06/21/2020   Physical Findings:  AIMS: Facial and Oral Movements Muscles of Facial Expression: None, normal Lips and Perioral Area: None, normal Jaw: None,  normal Tongue: None, normal,Extremity Movements Upper (arms, wrists, hands, fingers): None, normal Lower (legs, knees, ankles, toes): None, normal, Trunk Movements Neck, shoulders, hips: None, normal, Overall Severity Severity of abnormal movements (highest score from questions above): None, normal Incapacitation due to abnormal movements: None, normal Patient's awareness of abnormal movements (rate only patient's report): No Awareness, Dental Status Current problems with teeth and/or dentures?: No Does patient usually wear dentures?: No   Musculoskeletal: Strength & Muscle Tone:untested  Gait & Station: untested in bed Patient leans: N/A  Psychiatric Specialty Exam:  Presentation  General Appearance: unkempt appearing  - wearing hair bonnet, wearing same t-shirt as yesterday  Eye Contact:Fair  Speech: speaks in short phrases to direct questions  Speech Volume:Normal  Mood and Affect  Mood:appears mildly anxious and confused  Affect: calm, polite, confused appearing  Thought Process  Thought Processes: concrete, some ruminations about desire for discharge  Orientation:Oriented to month, city, but not year, date, place or situation; knows Software engineer is Medical illustrator Content: Does not make delusional statements today; is not grossly responding to internal/external stimuli on exam; denies SI, HI, AVH or paranoia and denies ideas of reference or first rank symptoms; preoccupied about discharge plans  Hallucinations:Denied  Ideas of Reference:Denied  Suicidal Thoughts:Denied   Homicidal Thoughts:Denied   Sensorium  Memory:Poor  Judgment:Poor  Insight:Poor  Executive Functions  Concentration:Poor  Attention Span:Poor  Cerulean  Psychomotor Activity  Psychomotor Activity:no restlessness, akathisia or tremors noted; no cogwheeling or tremor AIMS 0  Assets  Assets:Resilience  Physical Exam Vitals and nursing  note reviewed.  Constitutional:      Appearance: Normal appearance.  HENT:     Head: Normocephalic.     Mouth/Throat:     Comments: No drooling today Pulmonary:     Effort: Pulmonary effort is normal.  Neurological:     General: No focal deficit present.     Mental Status: [name] alert.    Review of Systems  Respiratory:  Negative for shortness of breath.   Cardiovascular:  Negative for chest pain and palpitations.  Gastrointestinal:  Negative for abdominal pain, constipation, diarrhea, nausea and vomiting.  Neurological: Negative.  Negative for dizziness and headaches.   Blood pressure (!) 122/90, pulse (!) 117, temperature 99 F (37.2 C), temperature source Oral, resp. rate 16, height _0  (1.676 m), weight 72.6 kg, SpO2 97 %. Body mass index is 25.83 kg/m.  Treatment Plan Summary:  Diagnoses / Active Problems: Bipolar I MRE manic with psychotic features Major Neurocognitive d/o with behavioral disturbance  PLAN: Safety and Monitoring:  -- Voluntary admission (signed in by guardian for treatment on 11/19/21) to inpatient psychiatric unit for safety, stabilization and treatment  -- Daily contact with patient to assess and evaluate symptoms and progress in treatment  -- Patient's case to be discussed in multi-disciplinary team meeting  -- Observation Level : q15 minute checks  -- Vital signs:  q12 hours  -- Precautions: suicide, elopement, and assault  2. Psychiatric Diagnoses and Treatment:   Bipolar I MRE manic with psychotic features (r/o schizoaffective d/o - bipolar type) Major neurocognitive disorder with behavioral disturbance (marked for age brain atrophy on CT head and MOCA 11/30)   -Continue Haldol 102m bid - (discussed with guardian and will continue present dose with hope of attempting to taper off in future if tolerated) - Continue Cogentin 0.5 mg bid  -Invega Sustenna 234 mg IM given on 09-30-21, 1522mIM given 10/12/20 - Invega 78 mg given on 7/21; Invega 7851mgiven 8/18 with consent by guardian -- Zyprexa/Geodon/ativan agitation protocol PRN   --Additional psychosis labs (HIV- , RPR-, ANA-, heavy metal WNL, ESR wnl, ceruloplasmin wnl and CT head shows Marked for age brain atrophy.  No acute or reversible finding.)   --Depakote, Ativan, and Zyprexa stopped -- ACTT referral pending -- Continue Aricept 5mg18mily for memory issues -- Interim guardianship in place with mother and waiting on SSDI and  medicaid for group home referral  3. Medical Issues Being Addressed:   Tobacco Use Disorder  -- Nicotine patch 21m/24 hours ordered and d/c nicotine gum    Hyperammonemia -resolved --Ammonia level 30 on 10/27/21 and 25 on 8/4 --Depakote was stopped     Elevated creatinine  --Creatinine of 1.42 on 8/16 - on repeat today 1.19 with GFR 54 -- Continue to encourage po fluids and trend     Seasonal allergies --Restart Flonase PRN per guardian approval  Constipation - improved -- Continue colace 1021mqd dosing  -- PRN Miralax for severe constipation  Nausea/Vomiting -intermittent -- SLP saw for swallow assessment and feels is cognitively based dysphagia - no limitations continue regular diet and thin liquid -- sitter for meals to help with swallowing/portion control   Tachycardia  -- Appears intermittent - asymptomatic - will monitor and encourage po hydration - manual HR 98  Hypokalemia - resolved - Received K+ 20 meq bid and repeat BMP 8/18 shows K+ 4.0  4. Discharge Planning:   -- Social work and case management to assist with discharge planning and identification of hospital follow-up needs prior to discharge  -- Discharge Concerns: Need to establish a safety plan; Medication compliance and effectiveness; safe housing   -- Discharge Goals: ALF or group home with outpatient referrals for mental health follow-up including medication management/psychotherapy  Labs On Admission- CMP: WNL except Cr: 1.1,  GFR: 60,  CBC: WNL,  UDS: Neg,  Troponin:  3,  EtOH: Neg,  beta hCG: 6.4,  Resp Panel: Neg 5/29- Urine Preg: Neg,  A1c: 5.3,  TSH: 1.719,  Lipid Panel: WNL except LDL: 101 5/24- BMP: WNL except Cr: 1.01,  Ca: 8.4,  hCG: 5,  Urine Preg: Neg 5/28- Ammonia: 32,  CBC: WNL,  CMP: WNL except  Cr: 1.08,  Ca: 8.6,  Depakote lvl: 20 6/3- Depakote lvl: 64, Hep Function: WNL,  CBC: WNL except Hem: 11.9 6/7- Depakote lvl: 75 6/18- UA: Neg,  CBC: WNL,  Depakote lvl: 136,  Ammonia: 99,  BMP: WNL except BUN: 22,  Cr: 1.19,  Ca: 8.6,  GFR: 54,  Hep Function: WNL except Albu: 3.3,  Indirect Bili: 0.2 6/20- ANA: Neg,  Ammonia: 48,  Ceruloplasmin: 24.2,  CMP: WNL except BUN: 23,  Cr: 1.35,  Ca: 8.3,  Albu: 3.4  HIV:Neg,  RPR: Neg,  Sed Rate: 6,  Depakote lvl: 25,  Heavy Metal: Neg 6/23- Ammonia: 46,  BMP: WNL except Cr: 1.2,  Ca: 8.3,  GFR: 54 6/24- UA: WNL except Bacteria: rare 6/30- CMP:  WNL except Cr: 1.04,  Ca: 8.6,  CBC: WNL,  Ammonia: 41, EKG: NSR with Qtc: 399 7/8 - CMP WNL other than CR 1.33 and GFR 48; Ammonia 30 7/18 - Cr of 1.22, eGFR of 53 7/23 - EKG with Qtc of 422 7/31 - creatinine 1.08 8/1 - creatinine 1.24 8/3 - COVID negative 8/4 - CBC WNL, NH3 25, CMP WNL other than creatinine 1.05 and glucose 122, lipase WNL 8/6 - CXR negative and COVID negative 8/16 - Creatinine 1.42 with GFR 44, glucose 146 and K+ 3.4 otherwise BMP WNL 8/18 - Creatinine 1.19 with GFR 54, glucose 110, and CL 112 and K+ 4.0  AmHarlow AsaMD, FAPA 12/08/2021, 7:55 AM

## 2021-12-09 DIAGNOSIS — F312 Bipolar disorder, current episode manic severe with psychotic features: Secondary | ICD-10-CM | POA: Diagnosis not present

## 2021-12-09 NOTE — [facility] Group Notes (Signed)
Adult Psychoeducational Group Note Date:  12/09/2021 Time:  9417-4081 Group Topic/Focus: PROGRESSIVE RELAXATION. A group where deep breathing is taught and tensing and relaxation muscle groups is used. Imagery is used as well.  Pts are asked to imagine 3 pillars that hold them up when they are not able to hold themselves up and to share that with the group.  Participation Level:  Active  Participation Quality:  Appropriate  Affect:  Appropriate  Cognitive:  Oriented  Insight: Improving  Engagement in Group:  Engaged  Modes of Intervention:  Activity, Discussion, Education, and Support  Additional Comments:  Pt rates her energy at 10/10. States her family supports her by coming to visit her, the hospital and the doctors  Dione Housekeeper

## 2021-12-09 NOTE — Progress Notes (Signed)
   12/09/21 2015  Psych Admission Type (Psych Patients Only)  Admission Status Involuntary  Psychosocial Assessment  Patient Complaints Confusion  Eye Contact Fair;Glaring  Facial Expression Flat  Affect Anxious  Speech Soft  Interaction Cautious  Motor Activity Slow  Appearance/Hygiene Poor hygiene  Behavior Characteristics Cooperative  Mood Anxious;Suspicious  Aggressive Behavior  Effect No apparent injury  Thought Process  Coherency Disorganized  Content Blaming others  Delusions WDL  Perception WDL  Hallucination None reported or observed  Judgment Impaired  Confusion Mild  Danger to Self  Current suicidal ideation? Denies  Danger to Others  Danger to Others None reported or observed

## 2021-12-09 NOTE — Progress Notes (Signed)
   12/09/21 0530  Sleep  Number of Hours 7.5    

## 2021-12-09 NOTE — Group Note (Signed)
LCSW Group Therapy   Due to threatening behavior towards CSW on 12/07/21 during 500 hall group (including some patients punching the air around CSW and saying they could "charge them" if they wanted), group was not held on 12/09/2021. Patients were offered to meet with CSW as needed.  Natalie Brown T Zohra Clavel LCSWA  12:24 PM  

## 2021-12-09 NOTE — Progress Notes (Signed)
Adult Psychoeducational Group Note  Date:  12/09/2021 Time:  9:55 PM  Group Topic/Focus:  Wrap-Up Group:   The focus of this group is to help patients review their daily goal of treatment and discuss progress on daily workbooks.  Participation Level:  Active  Participation Quality:  Appropriate  Affect:  Appropriate  Cognitive:  Appropriate  Insight: Appropriate  Engagement in Group:  Improving  Modes of Intervention:  Discussion  Additional Comments:  Pt did not participate in discussion.  Wynema Birch D 12/09/2021, 9:55 PM

## 2021-12-09 NOTE — Progress Notes (Signed)
   12/09/21 1400  Psych Admission Type (Psych Patients Only)  Admission Status Involuntary  Psychosocial Assessment  Patient Complaints Confusion;Worrying  Eye Contact Fair;Glaring  Facial Expression Flat  Affect Anxious  Speech Soft  Interaction Cautious  Motor Activity Slow  Appearance/Hygiene Poor hygiene  Behavior Characteristics Cooperative  Mood Anxious;Suspicious;Preoccupied  Aggressive Behavior  Effect No apparent injury  Thought Process  Coherency Disorganized  Content Blaming others  Delusions WDL  Perception WDL  Hallucination None reported or observed  Judgment Impaired  Confusion Mild  Danger to Self  Current suicidal ideation? Denies  Agreement Not to Harm Self Yes  Description of Agreement Verbal  Danger to Others  Danger to Others None reported or observed  Danger to Others Abnormal  Harmful Behavior to others No threats or harm toward other people  Destructive Behavior No threats or harm toward property

## 2021-12-09 NOTE — Progress Notes (Signed)
[facility] MD Progress Note  12/09/2021 8:10 AM Natalie Brown  MRN:  [id number]  Reason for Admission:  Natalie Brown is a 57 y.o. female  who was initially admitted for inpatient psychiatric hospitalization on 09/06/2021 for management of mania, disorganized behaviors/thinking, and delusions. The patient is currently on Hospital Day 94.   Chart Review from last 24 hours:  The patient's chart was reviewed and nursing notes were reviewed. The patient's case was discussed in multidisciplinary team meeting. Per nursing, she continues to appear confused and worried. She has continued poor hygiene and remains disorganized. She did not attend groups. Per MAR she was compliant with scheduled medications and did receive Flonase X1 yesterday and today and Vistaril X1 yesterday.  Information Obtained Today During Patient Interview: The patient was interviewed on the unit. [name] disheveled with dirty clothes on and resists prompts to allow [place] to wash her clothes or assist with a shower. She continues to state she is taking a bath but is mildly malodorous on exam today. She states she is sleeping and eating well and denies SI or HI. She knows it is August and that she is Natalie Brown and knows the Natalie Brown is Natalie Brown. She does not know why she is in the hospital and is ruminative about desire for discharge to live with her sister. She denies AVH, ideas of reference, first rank symptoms, paranoia and does not make delusional statements unprompted today. She remains confused and anxious appearing. She voices no physical complaints.   Principal Problem: Bipolar affective disorder, current episode manic with psychotic symptoms ([place]) Diagnosis: Principal Problem:   Bipolar affective disorder, current episode manic with psychotic symptoms ([place]) Active Problems:   Major neurocognitive disorder ([place])  Past Psychiatric History: see H&P  Past Medical History:  Past Medical History:  Diagnosis Date   Bipolar 1 disorder  with moderate mania ([place]) 12/30/2013   Hypertension    Family History: see H&P  Family Psychiatric  History: see H&P  Social History:  Homeless; mother is interim guardian   Sleep: Good - 7.5 hours  Appetite:  Good  Current Medications: Current Facility-Administered Medications  Medication Dose Route Frequency Provider Last Rate Last Admin   acetaminophen (TYLENOL) tablet 650 mg  650 mg Oral Q6H PRN Briant Cedar, MD   650 mg at 11/14/21 0759   alum & mag hydroxide-simeth (MAALOX/MYLANTA) 200-200-20 MG/5ML suspension 30 mL  30 mL Oral Q4H PRN Massengill, [name], MD       benztropine (COGENTIN) tablet 0.5 mg  0.5 mg Oral BID Nelda Marseille, Reign Dziuba E, MD   0.5 mg at 12/09/21 0806   docusate sodium (COLACE) capsule 100 mg  100 mg Oral Daily Nelda Marseille, Katelynn Heidler E, MD   100 mg at 12/09/21 0806   donepezil (ARICEPT) tablet 5 mg  5 mg Oral Daily Nelda Marseille, Isla Sabree E, MD   5 mg at 12/09/21 0806   feeding supplement (KATE FARMS STANDARD 1.4) liquid 325 mL  325 mL Oral BID BM Massengill, Nathan, MD   325 mL at 12/08/21 1353   fluticasone (FLONASE) 50 MCG/ACT nasal spray 1 spray  1 spray Each Nare Daily PRN Harlow Asa, MD   1 spray at 12/09/21 0806   haloperidol (HALDOL) tablet 2 mg  2 mg Oral BID Harlow Asa, MD   2 mg at 12/09/21 3149   hydrocerin (EUCERIN) cream   Topical BID Janine Limbo, MD   Given at 12/09/21 0806   hydrOXYzine (ATARAX) tablet 25 mg  25 mg  Oral TID PRN Janine Limbo, MD   25 mg at 12/08/21 2037   LORazepam (ATIVAN) tablet 1 mg  1 mg Oral Q6H PRN Harlow Asa, MD   1 mg at 10/07/21 1546   magnesium hydroxide (MILK OF MAGNESIA) suspension 30 mL  30 mL Oral Daily PRN Massengill, [name], MD   30 mL at 11/14/21 0802   nicotine (NICODERM CQ - dosed in mg/24 hours) patch 14 mg  14 mg Transdermal Daily Nelda Marseille, Ticara Waner E, MD   14 mg at 12/09/21 0807   OLANZapine zydis (ZYPREXA) disintegrating tablet 5 mg  5 mg Oral Q8H PRN Harlow Asa, MD   5 mg at 10/14/21 1416    And   ziprasidone (GEODON) injection 20 mg  20 mg Intramuscular PRN Harlow Asa, MD       [facility]) tablet 4 mg  4 mg Oral Q8H PRN Harlow Asa, MD   4 mg at 11/23/21 4098   polyethylene glycol (MIRALAX / GLYCOLAX) packet 17 g  17 g Oral Daily PRN Harlow Asa, MD       Lab Results:  No results found for this or any previous visit (from the past 109 hour(s)).   Blood Alcohol level:  Lab Results  Component Value Date   ETH <10 11/91/4782   Metabolic Disorder Labs: Lab Results  Component Value Date   HGBA1C 5.3 09/08/2021   MPG 105.41 09/08/2021   No results found for: "PROLACTIN" Lab Results  Component Value Date   CHOL 173 09/08/2021   TRIG 110 09/08/2021   HDL 50 09/08/2021   CHOLHDL 3.5 09/08/2021   VLDL 22 09/08/2021   LDLCALC 101 (H) 09/08/2021   LDLCALC 102 (H) 06/21/2020   Physical Findings:  AIMS: Facial and Oral Movements Muscles of Facial Expression: None, normal Lips and Perioral Area: None, normal Jaw: None, normal Tongue: None, normal,Extremity Movements Upper (arms, wrists, hands, fingers): None, normal Lower (legs, knees, ankles, toes): None, normal, Trunk Movements Neck, shoulders, hips: None, normal, Overall Severity Severity of abnormal movements (highest score from questions above): None, normal Incapacitation due to abnormal movements: None, normal Patient's awareness of abnormal movements (rate only patient's report): No Awareness, Dental Status Current problems with teeth and/or dentures?: No Does patient usually wear dentures?: No   Musculoskeletal: Strength & Muscle Tone: normal Gait & Station: normal Patient leans: N/A  Psychiatric Specialty Exam:  Presentation  General Appearance: unkempt appearing  - wearing hair bonnet, wearing dirty clothes - face appears unwashed and mildly malodorous  Eye Contact:Fair - glaring quality at times  Speech: speaks in short phrases to direct questions  Speech  Volume:Normal  Mood and Affect  Mood:appears mildly anxious and confused  Affect: calm, polite, confused appearing  Thought Process  Thought Processes: concrete, some ruminations about desire for discharge  Orientation:Oriented to month, city, but not year, date, place or situation; knows Software engineer is Medical illustrator Content: Does not make delusional statements today unprompted; is not grossly responding to internal/external stimuli on exam; denies SI, HI, AVH or paranoia and denies ideas of reference or first rank symptoms; preoccupied about discharge plans  Hallucinations:Denied  Ideas of Reference:Denied  Suicidal Thoughts:Denied   Homicidal Thoughts:Denied   Sensorium  Memory:Poor  Judgment:Poor  Insight:Poor  Executive Functions  Concentration:Poor  Attention Span:Poor  Recall:Poor  Fund of Knowledge:Limited  Language:Fair  Psychomotor Activity  Psychomotor Activity:no restlessness, akathisia or tremors noted  Assets  Assets:Resilience  Physical Exam Vitals and nursing note reviewed.  Constitutional:      Appearance: Normal appearance.  HENT:     Head: Normocephalic.     Mouth/Throat:     Comments: Occasional mild drooling on exam Pulmonary:     Effort: Pulmonary effort is normal.  Neurological:     General: No focal deficit present.     Mental Status: [name] alert.    Review of Systems  Respiratory:  Negative for shortness of breath.   Cardiovascular:  Negative for chest pain and palpitations.  Gastrointestinal:  Negative for abdominal pain, constipation, diarrhea, nausea and vomiting.  Neurological: Negative.  Negative for dizziness and headaches.   Blood pressure 126/87, pulse (!) 105, temperature 98.1 F (36.7 C), temperature source Oral, resp. rate 16, height 5' 6"  (1.676 m), weight 72.6 kg, SpO2 100 %. Body mass index is 25.83 kg/m.  Treatment Plan Summary:  Diagnoses / Active Problems: Bipolar I MRE manic with psychotic  features Major Neurocognitive d/o with behavioral disturbance  PLAN: Safety and Monitoring:  -- Voluntary admission (signed in by guardian for treatment on 11/19/21) to inpatient psychiatric unit for safety, stabilization and treatment  -- Daily contact with patient to assess and evaluate symptoms and progress in treatment  -- Patient's case to be discussed in multi-disciplinary team meeting  -- Observation Level : q15 minute checks  -- Vital signs:  q12 hours  -- Precautions: suicide, elopement, and assault  2. Psychiatric Diagnoses and Treatment:   Bipolar I MRE manic with psychotic features  Major neurocognitive disorder with behavioral disturbance (marked for age brain atrophy on CT head and MOCA 11/30)   -Continue Haldol 33m bid - (discussed with guardian and will continue present dose with hope of attempting to taper off in future if tolerated) - Continue Cogentin 0.5 mg bid  - consider atropine SL drops if drooling worsens -Invega Sustenna 234 mg IM given on 09-30-21, 1524mIM given 10/12/20 - Invega 78 mg given on 7/21; Invega 7867miven 8/18 with consent by guardian -- Zyprexa/Geodon/ativan agitation protocol PRN   --Additional psychosis labs (HIV- , RPR-, ANA-, heavy metal WNL, ESR wnl, ceruloplasmin wnl and CT head shows Marked for age brain atrophy.  No acute or reversible finding.)   --Depakote, Ativan, and Zyprexa stopped -- ACTT referral pending -- Continue Aricept 5mg64mily for memory issues -- Interim guardianship in place with mother and waiting on SSDI and medicaid for group home referral -- working on prompts to allow staff to assist with ADLs and hygiene but she is resistant  3. Medical Issues Being Addressed:   Tobacco Use Disorder  -- Nicotine patch 14mg55mhours ordered and d/c nicotine gum    Hyperammonemia -resolved --Ammonia level 30 on 10/27/21 and 25 on 8/4 --Depakote was stopped     Elevated creatinine  --Creatinine of 1.42 on 8/16 - on repeat today 1.19  with GFR 54 -- Continue to encourage po fluids and trend     Seasonal allergies --Restart Flonase PRN per guardian approval  Constipation - improved -- Continue colace 100mg 44mosing  -- PRN Miralax for severe constipation  Nausea/Vomiting -resolved -- SLP saw for swallow assessment and feels is cognitively based dysphagia - no limitations continue regular diet and thin liquid -- sitter for meals to help with swallowing/portion control   Tachycardia  -- Appears intermittent - asymptomatic - will monitor and encourage po hydration - May need to discuss trial of Inderal with guardian if persists  Hypokalemia - resolved - Received K+ 20 meq bid and repeat  BMP 8/18 shows K+ 4.0  4. Discharge Planning:   -- Social work and case management to assist with discharge planning and identification of hospital follow-up needs prior to discharge  -- Discharge Concerns: Need to establish a safety plan; Medication compliance and effectiveness; safe housing   -- Discharge Goals: ALF or group home with outpatient referrals for mental health follow-up including medication management/psychotherapy  Labs On Admission- CMP: WNL except Cr: 1.1,  GFR: 60,  CBC: WNL,  UDS: Neg,  Troponin: 3,  EtOH: Neg,  beta hCG: 6.4,  Resp Panel: Neg 5/29- Urine Preg: Neg,  A1c: 5.3,  TSH: 1.719,  Lipid Panel: WNL except LDL: 101 5/24- BMP: WNL except Cr: 1.01,  Ca: 8.4,  hCG: 5,  Urine Preg: Neg 5/28- Ammonia: 32,  CBC: WNL,  CMP: WNL except  Cr: 1.08,  Ca: 8.6,  Depakote lvl: 20 6/3- Depakote lvl: 64, Hep Function: WNL,  CBC: WNL except Hem: 11.9 6/7- Depakote lvl: 75 6/18- UA: Neg,  CBC: WNL,  Depakote lvl: 136,  Ammonia: 99,  BMP: WNL except BUN: 22,  Cr: 1.19,  Ca: 8.6,  GFR: 54,  Hep Function: WNL except Albu: 3.3,  Indirect Bili: 0.2 6/20- ANA: Neg,  Ammonia: 48,  Ceruloplasmin: 24.2,  CMP: WNL except BUN: 23,  Cr: 1.35,  Ca: 8.3,  Albu: 3.4  HIV:Neg,  RPR: Neg,  Sed Rate: 6,  Depakote lvl: 25,  Heavy Metal:  Neg 6/23- Ammonia: 46,  BMP: WNL except Cr: 1.2,  Ca: 8.3,  GFR: 54 6/24- UA: WNL except Bacteria: rare 6/30- CMP:  WNL except Cr: 1.04,  Ca: 8.6,  CBC: WNL,  Ammonia: 41, EKG: NSR with Qtc: 399 7/8 - CMP WNL other than CR 1.33 and GFR 48; Ammonia 30 7/18 - Cr of 1.22, eGFR of 53 7/23 - EKG with Qtc of 422 7/31 - creatinine 1.08 8/1 - creatinine 1.24 8/3 - COVID negative 8/4 - CBC WNL, NH3 25, CMP WNL other than creatinine 1.05 and glucose 122, lipase WNL 8/6 - CXR negative and COVID negative 8/16 - Creatinine 1.42 with GFR 44, glucose 146 and K+ 3.4 otherwise BMP WNL 8/18 - Creatinine 1.19 with GFR 54, glucose 110, and CL 112 and K+ 4.0  Harlow Asa, MD, FAPA 12/09/2021, 8:10 AM

## 2021-12-10 ENCOUNTER — Encounter (HOSPITAL_COMMUNITY): Payer: Self-pay

## 2021-12-10 DIAGNOSIS — F312 Bipolar disorder, current episode manic severe with psychotic features: Secondary | ICD-10-CM | POA: Diagnosis not present

## 2021-12-10 MED ORDER — PROPRANOLOL HCL 10 MG PO TABS
10.0000 mg | ORAL_TABLET | Freq: Two times a day (BID) | ORAL | Status: DC
  Administered 2021-12-10 – 2021-12-24 (×28): 10 mg via ORAL
  Filled 2021-12-10 (×33): qty 1

## 2021-12-10 MED ORDER — BENZTROPINE MESYLATE 0.5 MG PO TABS
0.5000 mg | ORAL_TABLET | Freq: Every day | ORAL | Status: DC
  Filled 2021-12-10: qty 1

## 2021-12-10 MED ORDER — ATROPINE SULFATE 1 % OP SOLN
1.0000 [drp] | Freq: Two times a day (BID) | OPHTHALMIC | Status: DC | PRN
Start: 2021-12-10 — End: 2021-12-11

## 2021-12-10 MED ORDER — BENZTROPINE MESYLATE 0.5 MG PO TABS
0.5000 mg | ORAL_TABLET | Freq: Two times a day (BID) | ORAL | Status: DC
  Administered 2021-12-10 – 2021-12-20 (×21): 0.5 mg via ORAL
  Filled 2021-12-10 (×24): qty 1

## 2021-12-10 MED ORDER — BENZTROPINE MESYLATE 1 MG PO TABS
1.0000 mg | ORAL_TABLET | Freq: Every day | ORAL | Status: DC
  Filled 2021-12-10: qty 1

## 2021-12-10 MED ORDER — BENZTROPINE MESYLATE 0.5 MG PO TABS: 0.5000 mg | ORAL_TABLET | Freq: Three times a day (TID) | ORAL | Status: DC

## 2021-12-10 NOTE — Plan of Care (Signed)
  Problem: Safety: Goal: Periods of time without injury will increase Outcome: Progressing   

## 2021-12-10 NOTE — Progress Notes (Signed)
   12/10/21 0530  Sleep  Number of Hours 6.5

## 2021-12-10 NOTE — Progress Notes (Addendum)
[facility] LCSW Progress Note   12/10/2021    2:44 PM  JAMAIRA SHERK   [id number]   Type of Contact and Topic:  SSI/SSDI application   CSW received call from Onnie Boer, [phone]) disability assistance coordinator at the [facility], regarding letters received pertaining to patient's disability application.   CSW received copy of letters regarding application status.  Pertaining to application for Social [employer], Survivors, and Disability Insurance (SSDI), the letter reads:  "You <Ariona Moynahan> do not qualify for disability benefits because you have not worked long enough under [employer] . . . Since you do not have enough work credits to qualify for benefits, we did not make a decision about whether you are disabled under our rules."   Pertaining to application for Social [employer] Income, the letter reads:   "We need more information to decide if we can pay you SSI." Letter lists deadline of 26 December 2021 to provide additional information.   [name] Investment banker, corporate reach out to [employer], Psychologist, clinical out of [place], [place] who requests information about patient's medical condition and psychosocial information. Contact information provided to writer as [phone] ext. [phone]. During call, [name] states "It seems like they are moving swiftly on her application."  CSW attempted to contact Vanessa Kick in order to provide any additional information/documentation related to application for social security disability. Left HIPAA compliant voicemail with contact information and callback request. CSW will make additional attempts to reach [employer]. Situation ongoing, CSW will continue to monitor and update note as more information becomes available.    Signed:  [name], MSW, [place], LCAS 12/10/2021 2:44 PM

## 2021-12-10 NOTE — Group Note (Signed)
Recreation Therapy Group Note   Group Topic:Health and Wellness  Group Date: 12/10/2021 Start Time: 1005 End Time: 1035 Facilitators: Caroll Rancher, LRT,CTRS Location: [address] Dayroom   Goal Area(s) Addresses:  Patient will define components of whole wellness. Patient will verbalize benefit of whole wellness.  Group Description:  LRT and patients discussed the three elements (mental, physical and spiritual) of wellness and how they intersect.  Patients also discuss the consequences when one of the elements is not in tune with the others.  LRT and patients then went into a series of exercises.  LRT led group in a series of stretches before allowing each group member to pick an exercise of their choosing to lead the group in.  The exercises were to take a minimum of 30 minutes. Each patient took turns leading group with their exercise.  Patients were encouraged to take breaks and get water as needed.   Affect/Mood: N/A   Participation Level: Did not attend    Clinical Observations/Individualized Feedback:     Plan: Continue to engage patient in RT group sessions 2-3x/week.   Caroll Rancher, LRT,CTRS 12/10/2021 11:52 AM

## 2021-12-10 NOTE — Group Note (Signed)
[facility] LCSW Group Therapy   12/10/2021 1pm    Type of Therapy and Topic:  Group Therapy:  Strengths Exploration   Participation Level: Did Not Attend  Description of Group: This group allows individuals to explore their strengths, learn to use strengths in new ways to improve well-being. Strengths-based interventions involve identifying strengths, understanding how they are used, and learning new ways to apply them. Individuals will identify their strengths, and then explore their roles in different areas of life (relationships, professional life, and personal fulfillment). Individuals will think about ways in which they currently use their strengths, along with new ways they could begin using them.    Therapeutic Goals Patient will verbalize two of their strengths Patient will identify how their strengths are currently used Patient will identify two new ways to apply their strengths  Patients will create a plan to apply their strengths in their daily lives     Summary of Patient Progress:  Did not attend.       Therapeutic Modalities Cognitive Behavioral Therapy Motivational Interviewing   Ledger Heindl, LCSW, LCAS Clincal Social Worker   [facility]   

## 2021-12-10 NOTE — Progress Notes (Signed)
[facility] MD Progress Note  12/10/2021 12:28 PM Natalie Brown  MRN:  [id number]  Reason for Admission:  Natalie Brown is a 56 y.o. female  who was initially admitted for inpatient psychiatric hospitalization on 09/06/2021 for management of mania, disorganized behaviors/thinking, and delusions. The patient is currently on Hospital Day 95.   Chart Review from last 24 hours:  The patient's chart was reviewed and nursing notes were reviewed. The patient's case was discussed in multidisciplinary team meeting. Per nursing, she attended scheduled groups yesterday and was more engaged. She continues to have poor hygiene despite multiple prompts to shower and change clothes. She had no acute behavioral issues noted. Per MAR she was compliant with scheduled medications and did receive Vistaril X1 and Flonase X1 yesterday.   Information Obtained Today During Patient Interview: The patient was interviewed on the unit. [name] sleeping but awakens to her name. She was confronted on fact that she is in dirty clothes and does not appear to have showered. She continues to insist that she is showering daily but there is no evidence of such. She was prompts to wash her hair and states she has no blow dryer or products. The multiple bottles of shampoo and body wash on her bed were pointed out to her. She states she is sleeping and eating well and denies issues with constipation or diarrhea. She voices no physical complaints. With questioning, she continues to report belief she can speak multiple languages but she is not ruminative about delusion that she has multiple master's degrees today. She knows she use to work at the [employer] and knows today's date accurately and the city. [name] ruminative about her desire for discharge. She continues to show no insight into her illness or reason for continued admission. She denies SI, HI, AVH,paranoia, ideas of reference or first rank symptoms.  Collateral call to  guardian/mother: I advised that patient is not attending to her hygiene despite prompts. Mother agrees to talk again to patient to try to reinforce this need. I discussed trial of atropine gtts (r/b/se/a)  to help with drooling in an effort to avoid having to increase Cogentin due to risk of increased Cogentin dosing potentially causing her more confusion. She agrees to atropine gtts PRN. I advised that we are checking orthostatic vitals and she appears anxious and HR has been elevated consistently. For help controlling HR and for anxiety, I recommended trial of Inderal 65m bid. The r/b/se/a to med were discussed and she consents to med trial. Mother requests family meeting later this week with print out of medications for her review and for general update with physician and SW. I advised I would pass this request on to oncoming provider and SW.   Principal Problem: Bipolar affective disorder, current episode manic with psychotic symptoms ([place] Diagnosis: Principal Problem:   Bipolar affective disorder, current episode manic with psychotic symptoms ([place] Active Problems:   Major neurocognitive disorder ([place]  Past Psychiatric History: see H&P  Past Medical History:  Past Medical History:  Diagnosis Date   Bipolar 1 disorder with moderate mania ([place] 12/30/2013   Hypertension    Family History: see H&P  Family Psychiatric  History: see H&P  Social History:  Homeless; mother is interim guardian   Sleep: Good - 6.5 hours  Appetite:  Good  Current Medications: Current Facility-Administered Medications  Medication Dose Route Frequency Provider Last Rate Last Admin   acetaminophen (TYLENOL) tablet 650 mg  650 mg Oral Q6H PRN Pashayan,  Redgie Grayer, MD   650 mg at 11/14/21 0759   alum & mag hydroxide-simeth (MAALOX/MYLANTA) 200-200-20 MG/5ML suspension 30 mL  30 mL Oral Q4H PRN Massengill, [name], MD       benztropine (COGENTIN) tablet 0.5 mg  0.5 mg Oral QHS Harlow Asa, MD        Derrill Memo ON 12/11/2021] benztropine (COGENTIN) tablet 1 mg  1 mg Oral Daily Nelda Marseille, Theona Muhs E, MD       docusate sodium (COLACE) capsule 100 mg  100 mg Oral Daily Nelda Marseille, Neal Trulson E, MD   100 mg at 12/10/21 9381   donepezil (ARICEPT) tablet 5 mg  5 mg Oral Daily Nelda Marseille, Maryiah Olvey E, MD   5 mg at 12/10/21 8299   feeding supplement (KATE FARMS STANDARD 1.4) liquid 325 mL  325 mL Oral BID BM Massengill, [name], MD   325 mL at 12/09/21 1358   fluticasone (FLONASE) 50 MCG/ACT nasal spray 1 spray  1 spray Each Nare Daily PRN Harlow Asa, MD   1 spray at 12/09/21 3716   haloperidol (HALDOL) tablet 2 mg  2 mg Oral BID Harlow Asa, MD   2 mg at 12/10/21 9678   hydrocerin (EUCERIN) cream   Topical BID Janine Limbo, MD   Given at 12/10/21 9381   hydrOXYzine (ATARAX) tablet 25 mg  25 mg Oral TID PRN Janine Limbo, MD   25 mg at 12/09/21 2102   LORazepam (ATIVAN) tablet 1 mg  1 mg Oral Q6H PRN Harlow Asa, MD   1 mg at 10/07/21 1546   magnesium hydroxide (MILK OF MAGNESIA) suspension 30 mL  30 mL Oral Daily PRN Massengill, [name], MD   30 mL at 11/14/21 0802   nicotine (NICODERM CQ - dosed in mg/24 hours) patch 14 mg  14 mg Transdermal Daily Nelda Marseille, Galo Sayed E, MD   14 mg at 12/10/21 0822   OLANZapine zydis (ZYPREXA) disintegrating tablet 5 mg  5 mg Oral Q8H PRN Harlow Asa, MD   5 mg at 10/14/21 1416   And   ziprasidone (GEODON) injection 20 mg  20 mg Intramuscular PRN Harlow Asa, MD       ondansetron [facility]) tablet 4 mg  4 mg Oral Q8H PRN Harlow Asa, MD   4 mg at 11/23/21 0175   polyethylene glycol (MIRALAX / GLYCOLAX) packet 17 g  17 g Oral Daily PRN Harlow Asa, MD       Lab Results:  No results found for this or any previous visit (from the past 32 hour(s)).   Blood Alcohol level:  Lab Results  Component Value Date   ETH <10 02/13/8526   Metabolic Disorder Labs: Lab Results  Component Value Date   HGBA1C 5.3 09/08/2021   MPG 105.41 09/08/2021   No results  found for: "PROLACTIN" Lab Results  Component Value Date   CHOL 173 09/08/2021   TRIG 110 09/08/2021   HDL 50 09/08/2021   CHOLHDL 3.5 09/08/2021   VLDL 22 09/08/2021   LDLCALC 101 (H) 09/08/2021   LDLCALC 102 (H) 06/21/2020   Physical Findings:  AIMS: Facial and Oral Movements Muscles of Facial Expression: None, normal Lips and Perioral Area: None, normal Jaw: None, normal Tongue: None, normal,Extremity Movements Upper (arms, wrists, hands, fingers): None, normal Lower (legs, knees, ankles, toes): None, normal, Trunk Movements Neck, shoulders, hips: None, normal, Overall Severity Severity of abnormal movements (highest score from questions above): None, normal Incapacitation due to abnormal movements: None, normal Patient's  awareness of abnormal movements (rate only patient's report): No Awareness, Dental Status Current problems with teeth and/or dentures?: No Does patient usually wear dentures?: No   Musculoskeletal: Strength & Muscle Tone: normal Gait & Station: normal Patient leans: N/A  Psychiatric Specialty Exam:  Presentation  General Appearance: unkempt appearing  - wearing hair bonnet, wearing same dirty clothes for last several days - face appears unwashed and mildly malodorous  Eye Contact:Fair - glaring quality at times  Speech: speaks in short phrases to direct questions  Speech Volume:Normal  Mood and Affect  Mood:appears mildly anxious and confused  Affect: calm, polite, confused appearing  Thought Process  Thought Processes: concrete, some ruminations about desire for discharge  Orientation:Oriented to month, date, and year and city - not to situation  Thought Content: With questions admits she still believes she speaks multiple languages but does not make unprompted delusional statements; denies AVH, paranoia, ideas of reference, first rank symptoms, SI or HI and is not grossly responding to internal/external stimuli on  exam  Hallucinations:Denied  Ideas of Reference:Denied  Suicidal Thoughts:Denied   Homicidal Thoughts:Denied   Sensorium  Memory:Poor  Judgment:Poor  Insight:Poor  Executive Functions  Concentration:Poor  Attention Span:Poor  Recall:Poor  Fund of Knowledge:Limited  Language:Fair  Psychomotor Activity  Psychomotor Activity:no restlessness, akathisia or tremors noted, AIMS 0  Assets  Assets:Resilience  Physical Exam Vitals and nursing note reviewed.  Constitutional:      Appearance: Normal appearance.  HENT:     Head: Normocephalic.     Mouth/Throat:     Comments: Increased drooling on exam Pulmonary:     Effort: Pulmonary effort is normal.  Neurological:     General: No focal deficit present.     Mental Status: [name] alert.    Review of Systems  Respiratory:  Negative for shortness of breath.   Cardiovascular:  Negative for chest pain and palpitations.  Gastrointestinal:  Negative for abdominal pain, constipation, diarrhea, nausea and vomiting.  Neurological: Negative.  Negative for dizziness and headaches.   Blood pressure (!) 126/94, pulse (!) 120, temperature 98.3 F (36.8 C), temperature source Oral, resp. rate 18, height _0  (1.676 m), weight 72.6 kg, SpO2 99 %. Body mass index is 25.83 kg/m.  Treatment Plan Summary:  Diagnoses / Active Problems: Bipolar I MRE manic with psychotic features Major Neurocognitive d/o with behavioral disturbance  PLAN: Safety and Monitoring:  -- Voluntary admission (signed in by guardian for treatment on 11/19/21) to inpatient psychiatric unit for safety, stabilization and treatment  -- Daily contact with patient to assess and evaluate symptoms and progress in treatment  -- Patient's case to be discussed in multi-disciplinary team meeting  -- Observation Level : q15 minute checks  -- Vital signs:  q12 hours  -- Precautions: suicide, elopement, and assault  -- Guardian requests family meeting this week with  SW and MD with med print out and plans to discuss updates in person- SW notified of request to coordinate  2. Psychiatric Diagnoses and Treatment:   Bipolar I MRE manic with psychotic features  Major neurocognitive disorder with behavioral disturbance (marked for age brain atrophy on CT head and MOCA 11/30)   -Continue Haldol 109m bid - (discussed with guardian and will continue present dose with hope of attempting to taper off in future if tolerated) - Continue Cogentin 0.532mbid and monitor for EPS - Start atropine ophthalmologic gtts 1 drop q12 hour SL PRN drooling (guardian consents to med trial) - Start Inderal 1037mid for  help with anxiety and tachycardia (guardian consents to med trial) -Invega Sustenna 234 mg IM given on 09-30-21, 158m IM given 10/12/20 - Invega 78 mg given on 7/21; Invega 766mgiven 8/18 with consent by guardian -- Zyprexa/Geodon/ativan agitation protocol PRN   --Additional psychosis labs (HIV- , RPR-, ANA-, heavy metal WNL, ESR wnl, ceruloplasmin wnl and CT head shows Marked for age brain atrophy.  No acute or reversible finding.)   --Depakote, Ativan, and Zyprexa stopped -- ACTT referral pending -- Continue Aricept 20m54maily for memory issues -- Interim guardianship in place with mother and waiting on SSDI and medicaid for group home referral -- working on prompts to allow staff to assist with ADLs and hygiene but she is resistant  3. Medical Issues Being Addressed:   Tobacco Use Disorder  -- Nicotine patch 83m66m hours ordered and d/c nicotine gum    Hyperammonemia -resolved --Ammonia level 30 on 10/27/21 and 25 on 8/4 --Depakote was stopped     Elevated creatinine  --Creatinine of 1.42 on 8/16 - on repeat today 1.19 with GFR 54 -- Continue to encourage po fluids and trend     Seasonal allergies --Restart Flonase PRN per guardian approval  Constipation - improved -- Continue colace 100mg68mdosing  -- PRN Miralax for severe  constipation  Nausea/Vomiting -resolved -- SLP saw for swallow assessment and feels is cognitively based dysphagia - no limitations continue regular diet and thin liquid -- sitter for meals to help with swallowing/portion control   Tachycardia  -- Rechecking EKG and starting Inderal 10mg 120m- checking orthostatic vitals  Hypokalemia - resolved - Received K+ 20 meq bid and repeat BMP 8/18 shows K+ 4.0  4. Discharge Planning:   -- Social work and case management to assist with discharge planning and identification of hospital follow-up needs prior to discharge  -- Discharge Concerns: Need to establish a safety plan; Medication compliance and effectiveness; safe housing   -- Discharge Goals: ALF or group home with outpatient referrals for mental health follow-up including medication management/psychotherapy  Labs On Admission- CMP: WNL except Cr: 1.1,  GFR: 60,  CBC: WNL,  UDS: Neg,  Troponin: 3,  EtOH: Neg,  beta hCG: 6.4,  Resp Panel: Neg 5/29- Urine Preg: Neg,  A1c: 5.3,  TSH: 1.719,  Lipid Panel: WNL except LDL: 101 5/24- BMP: WNL except Cr: 1.01,  Ca: 8.4,  hCG: 5,  Urine Preg: Neg 5/28- Ammonia: 32,  CBC: WNL,  CMP: WNL except  Cr: 1.08,  Ca: 8.6,  Depakote lvl: 20 6/3- Depakote lvl: 64, Hep Function: WNL,  CBC: WNL except Hem: 11.9 6/7- Depakote lvl: 75 6/18- UA: Neg,  CBC: WNL,  Depakote lvl: 136,  Ammonia: 99,  BMP: WNL except BUN: 22,  Cr: 1.19,  Ca: 8.6,  GFR: 54,  Hep Function: WNL except Albu: 3.3,  Indirect Bili: 0.2 6/20- ANA: Neg,  Ammonia: 48,  Ceruloplasmin: 24.2,  CMP: WNL except BUN: 23,  Cr: 1.35,  Ca: 8.3,  Albu: 3.4  HIV:Neg,  RPR: Neg,  Sed Rate: 6,  Depakote lvl: 25,  Heavy Metal: Neg 6/23- Ammonia: 46,  BMP: WNL except Cr: 1.2,  Ca: 8.3,  GFR: 54 6/24- UA: WNL except Bacteria: rare 6/30- CMP:  WNL except Cr: 1.04,  Ca: 8.6,  CBC: WNL,  Ammonia: 41, EKG: NSR with Qtc: 399 7/8 - CMP WNL other than CR 1.33 and GFR 48; Ammonia 30 7/18 - Cr of 1.22, eGFR of 53 7/23 -  EKG with  Qtc of 422 7/31 - creatinine 1.08 8/1 - creatinine 1.24 8/3 - COVID negative 8/4 - CBC WNL, NH3 25, CMP WNL other than creatinine 1.05 and glucose 122, lipase WNL 8/6 - CXR negative and COVID negative 8/16 - Creatinine 1.42 with GFR 44, glucose 146 and K+ 3.4 otherwise BMP WNL 8/18 - Creatinine 1.19 with GFR 54, glucose 110, and CL 112 and K+ 4.0  Harlow Asa, MD, FAPA 12/10/2021, 12:28 PM

## 2021-12-10 NOTE — Progress Notes (Signed)
Adult Psychoeducational Group Note  Date:  12/10/2021 Time:  9:08 PM  Group Topic/Focus:  Wrap-Up Group:   The focus of this group is to help patients review their daily goal of treatment and discuss progress on daily workbooks.  Participation Level:  Did Not Attend  Participation Quality:   Did Not Attend  Affect:   Did Not Attend  Cognitive:  Did Not Attend  Insight: None  Engagement in Group:   Did Not Attend  Modes of Intervention:   Did Not Attend  Additional Comments:  Pt was encouraged to attend wrap up group but did not attend.  Felipa Furnace 12/10/2021, 9:08 PM

## 2021-12-11 DIAGNOSIS — F312 Bipolar disorder, current episode manic severe with psychotic features: Secondary | ICD-10-CM | POA: Diagnosis not present

## 2021-12-11 MED ORDER — ATROPINE SULFATE 1 % OP SOLN
1.0000 [drp] | Freq: Three times a day (TID) | OPHTHALMIC | Status: DC | PRN
Start: 2021-12-11 — End: 2021-12-24
  Administered 2021-12-11 – 2021-12-22 (×7): 1 [drp] via SUBLINGUAL
  Filled 2021-12-11 (×2): qty 2

## 2021-12-11 NOTE — Progress Notes (Signed)
[facility] MD Progress Note  12/11/2021 10:28 AM Natalie Brown  MRN:  [id number]  Reason for Admission:  Natalie Brown is a 56 y.o. female  who was initially admitted for inpatient psychiatric hospitalization on 09/06/2021 for management of mania, disorganized behaviors/thinking, and delusions. The patient is currently on Hospital Day 96.   Chart Review from last 24 hours:  The patient's chart was reviewed and nursing notes were reviewed. The patient's case was discussed in multidisciplinary team meeting.  Patient is compliant with her medications, as needed Atarax for anxiety was given twice yesterday, also using Flonase as needed.  Information Obtained Today During Patient Interview: The patient was interviewed in her room, she is primarily sleeping in the bed but easily aroused when calling her name, she reports good sleep and appetite and denies SI HI or AVH or physical complaints.  I did continue to encourage patient for fluid intake as well as bathing and washing her cloth, her hygiene seems to continue to be an issue on the unit as reported per staff but she does not present malodorous but her cough seems to be stained.   With further questioning patient seems to be continuing to be confused at baseline not oriented to time, limited orientation to place and situation, consistent with current disorder diagnosis.   Principal Problem: Bipolar affective disorder, current episode manic with psychotic symptoms ([place]) Diagnosis: Principal Problem:   Bipolar affective disorder, current episode manic with psychotic symptoms ([employer]) Active Problems:   Major neurocognitive disorder ([place])  Past Psychiatric History: see H&P  Past Medical History:  Past Medical History:  Diagnosis Date   Bipolar 1 disorder with moderate mania ([place]) 12/30/2013   Hypertension    Family History: see H&P  Family Psychiatric  History: see H&P  Social History:  Homeless; mother is interim guardian   Sleep: Good -  6.5 hours  Appetite:  Good  Current Medications: Current Facility-Administered Medications  Medication Dose Route Frequency Provider Last Rate Last Admin   acetaminophen (TYLENOL) tablet 650 mg  650 mg Oral Q6H PRN Briant Cedar, MD   650 mg at 11/14/21 0759   alum & mag hydroxide-simeth (MAALOX/MYLANTA) 200-200-20 MG/5ML suspension 30 mL  30 mL Oral Q4H PRN Massengill, [name], MD       atropine 1 % ophthalmic solution 1 drop  1 drop Sublingual Q12H PRN Nelda Marseille, Amy E, MD       benztropine (COGENTIN) tablet 0.5 mg  0.5 mg Oral BID Nelda Marseille, Amy E, MD   0.5 mg at 12/11/21 0810   docusate sodium (COLACE) capsule 100 mg  100 mg Oral Daily Nelda Marseille, Amy E, MD   100 mg at 12/11/21 0810   donepezil (ARICEPT) tablet 5 mg  5 mg Oral Daily Nelda Marseille, Amy E, MD   5 mg at 12/11/21 0810   feeding supplement (KATE FARMS STANDARD 1.4) liquid 325 mL  325 mL Oral BID BM Massengill, [name], MD   325 mL at 12/11/21 0811   fluticasone (FLONASE) 50 MCG/ACT nasal spray 1 spray  1 spray Each Nare Daily PRN Harlow Asa, MD   1 spray at 12/11/21 0817   haloperidol (HALDOL) tablet 2 mg  2 mg Oral BID Harlow Asa, MD   2 mg at 12/11/21 8315   hydrocerin (EUCERIN) cream   Topical BID Janine Limbo, MD   Given at 12/11/21 1761   hydrOXYzine (ATARAX) tablet 25 mg  25 mg Oral TID PRN Janine Limbo, MD   25 mg  at 12/10/21 2107   LORazepam (ATIVAN) tablet 1 mg  1 mg Oral Q6H PRN Harlow Asa, MD   1 mg at 10/07/21 1546   magnesium hydroxide (MILK OF MAGNESIA) suspension 30 mL  30 mL Oral Daily PRN Massengill, [name], MD   30 mL at 11/14/21 0802   nicotine (NICODERM CQ - dosed in mg/24 hours) patch 14 mg  14 mg Transdermal Daily Nelda Marseille, Amy E, MD   14 mg at 12/11/21 0811   OLANZapine zydis (ZYPREXA) disintegrating tablet 5 mg  5 mg Oral Q8H PRN Harlow Asa, MD   5 mg at 10/14/21 1416   And   ziprasidone (GEODON) injection 20 mg  20 mg Intramuscular PRN Harlow Asa, MD        ondansetron [facility]) tablet 4 mg  4 mg Oral Q8H PRN Harlow Asa, MD   4 mg at 11/23/21 3833   polyethylene glycol (MIRALAX / GLYCOLAX) packet 17 g  17 g Oral Daily PRN Harlow Asa, MD       propranolol (INDERAL) tablet 10 mg  10 mg Oral BID Harlow Asa, MD   10 mg at 12/11/21 3832   Lab Results:  No results found for this or any previous visit (from the past 20 hour(s)).   Blood Alcohol level:  Lab Results  Component Value Date   ETH <10 91/91/6606   Metabolic Disorder Labs: Lab Results  Component Value Date   HGBA1C 5.3 09/08/2021   MPG 105.41 09/08/2021   No results found for: "PROLACTIN" Lab Results  Component Value Date   CHOL 173 09/08/2021   TRIG 110 09/08/2021   HDL 50 09/08/2021   CHOLHDL 3.5 09/08/2021   VLDL 22 09/08/2021   LDLCALC 101 (H) 09/08/2021   LDLCALC 102 (H) 06/21/2020   Physical Findings:  AIMS: Facial and Oral Movements Muscles of Facial Expression: None, normal Lips and Perioral Area: None, normal Jaw: None, normal Tongue: None, normal,Extremity Movements Upper (arms, wrists, hands, fingers): None, normal Lower (legs, knees, ankles, toes): None, normal, Trunk Movements Neck, shoulders, hips: None, normal, Overall Severity Severity of abnormal movements (highest score from questions above): None, normal Incapacitation due to abnormal movements: None, normal Patient's awareness of abnormal movements (rate only patient's report): No Awareness, Dental Status Current problems with teeth and/or dentures?: No Does patient usually wear dentures?: No   Musculoskeletal: Strength & Muscle Tone: normal Gait & Station: normal Patient leans: N/A  Psychiatric Specialty Exam:  Presentation  General Appearance: unkempt appearing  - wearing hair bonnet, wearing same dirty clothes for last several days - face appears unwashed and mildly malodorous  Eye Contact:Fair - glaring quality at times  Speech: speaks in short phrases to direct  questions  Speech Volume:Normal  Mood and Affect  Mood:appears mildly anxious and confused  Affect: calm, polite, confused appearing  Thought Process  Thought Processes: concrete, confused at times  Orientation: Partially oriented to time place and situation  Thought Content: Denies SI HI or AVH, does not present paranoid or responding to stimuli. Hallucinations:Denied  Ideas of Reference:Denied  Suicidal Thoughts:Denied   Homicidal Thoughts:Denied   Sensorium  Memory:Poor  Judgment:Poor  Insight:Poor  Executive Functions  Concentration:Poor  Attention Span:Poor  Recall:Poor  Fund of Knowledge:Limited  Language:Fair  Psychomotor Activity  Psychomotor Activity:no restlessness, akathisia or tremors noted, AIMS 0  Assets  Assets:Resilience  Physical Exam Vitals and nursing note reviewed.  Constitutional:      Appearance: Normal appearance.  HENT:  Head: Normocephalic.     Mouth/Throat:     Comments: Increased drooling on exam Pulmonary:     Effort: Pulmonary effort is normal.  Neurological:     General: No focal deficit present.     Mental Status: [name] alert.    Review of Systems  Constitutional: Negative.   HENT: Negative.    Eyes: Negative.   Respiratory: Negative.  Negative for shortness of breath.   Cardiovascular: Negative.  Negative for chest pain and palpitations.  Gastrointestinal: Negative.  Negative for abdominal pain, constipation, diarrhea, nausea and vomiting.  Genitourinary: Negative.   Musculoskeletal: Negative.   Skin: Negative.   Neurological: Negative.  Negative for dizziness and headaches.   Blood pressure (!) 126/90, pulse (!) 117, temperature 98.2 F (36.8 C), temperature source Oral, resp. rate 18, height 5' 6"  (1.676 m), weight 72.6 kg, SpO2 98 %. Body mass index is 25.83 kg/m.  Treatment Plan Summary:  Diagnoses / Active Problems: Bipolar I MRE manic with psychotic features Major Neurocognitive d/o with  behavioral disturbance  PLAN: Safety and Monitoring:  -- Voluntary admission (signed in by guardian for treatment on 11/19/21) to inpatient psychiatric unit for safety, stabilization and treatment  -- Daily contact with patient to assess and evaluate symptoms and progress in treatment  -- Patient's case to be discussed in multi-disciplinary team meeting  -- Observation Level : q15 minute checks  -- Vital signs:  q12 hours  -- Precautions: suicide, elopement, and assault  -- Guardian requests family meeting this week with SW and MD with med print out and plans to discuss updates in person- SW notified of request to coordinate, will follow.  2. Psychiatric Diagnoses and Treatment:   Bipolar I MRE manic with psychotic features  Major neurocognitive disorder with behavioral disturbance (marked for age brain atrophy on CT head and MOCA 11/30) -Continue Aricept 5 mg daily for major neurocognitive disorder with plan to titrate to 10 mg on 9/4   -Continue Haldol 18m bid - (discussed with guardian and will continue present dose with hope of attempting to taper off in future if tolerated) - Continue Cogentin 0.530mbid and monitor for EPS -Continue atropine ophthalmologic gtts 1 drop q12 hour SL PRN drooling (guardian consents to med trial) -Continue Inderal 105mid for help with anxiety and tachycardia (guardian consents to med trial) -InLorayne Benderstenna 234 mg IM given on 09-30-21, 156m11m given 10/12/20 - Invega 78 mg given on 7/21; Invega 78mg66men 8/18 with consent by guardian -- Zyprexa/Geodon/ativan agitation protocol PRN   --Additional psychosis labs (HIV- , RPR-, ANA-, heavy metal WNL, ESR wnl, ceruloplasmin wnl and CT head shows Marked for age brain atrophy.  No acute or reversible finding.)   --Depakote, Ativan, and Zyprexa stopped -- ACTT referral pending -- Continue Aricept 5mg d11my for memory issues -- Interim guardianship in place with mother and waiting on SSDI and medicaid for group  home referral -- working on prompts to allow staff to assist with ADLs and hygiene but she is resistant  3. Medical Issues Being Addressed:   Tobacco Use Disorder  -- Nicotine patch 14mg/2105murs ordered and d/c nicotine gum    Hyperammonemia -resolved --Ammonia level 30 on 10/27/21 and 25 on 8/4 --Depakote was stopped     Elevated creatinine  --Creatinine of 1.42 on 8/16 - on repeat today 1.19 with GFR 54 -- Continue to encourage po fluids and trend     Seasonal allergies --Restart Flonase PRN per guardian approval  Constipation -  improved -- Continue colace 164m qd dosing  -- PRN Miralax for severe constipation  Nausea/Vomiting -resolved -- SLP saw for swallow assessment and feels is cognitively based dysphagia - no limitations continue regular diet and thin liquid -- sitter for meals to help with swallowing/portion control   Tachycardia  -- Rechecking EKG and starting Inderal 15mbid - checking orthostatic vitals  Hypokalemia - resolved - Received K+ 20 meq bid and repeat BMP 8/18 shows K+ 4.0  4. Discharge Planning:   -- Social work and case management to assist with discharge planning and identification of hospital follow-up needs prior to discharge  -- Discharge Concerns: Need to establish a safety plan; Medication compliance and effectiveness; safe housing   -- Discharge Goals: ALF or group home with outpatient referrals for mental health follow-up including medication management/psychotherapy  Labs On Admission- CMP: WNL except Cr: 1.1,  GFR: 60,  CBC: WNL,  UDS: Neg,  Troponin: 3,  EtOH: Neg,  beta hCG: 6.4,  Resp Panel: Neg 5/29- Urine Preg: Neg,  A1c: 5.3,  TSH: 1.719,  Lipid Panel: WNL except LDL: 101 5/24- BMP: WNL except Cr: 1.01,  Ca: 8.4,  hCG: 5,  Urine Preg: Neg 5/28- Ammonia: 32,  CBC: WNL,  CMP: WNL except  Cr: 1.08,  Ca: 8.6,  Depakote lvl: 20 6/3- Depakote lvl: 64, Hep Function: WNL,  CBC: WNL except Hem: 11.9 6/7- Depakote lvl: 75 6/18- UA: Neg,   CBC: WNL,  Depakote lvl: 136,  Ammonia: 99,  BMP: WNL except BUN: 22,  Cr: 1.19,  Ca: 8.6,  GFR: 54,  Hep Function: WNL except Albu: 3.3,  Indirect Bili: 0.2 6/20- ANA: Neg,  Ammonia: 48,  Ceruloplasmin: 24.2,  CMP: WNL except BUN: 23,  Cr: 1.35,  Ca: 8.3,  Albu: 3.4  HIV:Neg,  RPR: Neg,  Sed Rate: 6,  Depakote lvl: 25,  Heavy Metal: Neg 6/23- Ammonia: 46,  BMP: WNL except Cr: 1.2,  Ca: 8.3,  GFR: 54 6/24- UA: WNL except Bacteria: rare 6/30- CMP:  WNL except Cr: 1.04,  Ca: 8.6,  CBC: WNL,  Ammonia: 41, EKG: NSR with Qtc: 399 7/8 - CMP WNL other than CR 1.33 and GFR 48; Ammonia 30 7/18 - Cr of 1.22, eGFR of 53 7/23 - EKG with Qtc of 422 7/31 - creatinine 1.08 8/1 - creatinine 1.24 8/3 - COVID negative 8/4 - CBC WNL, NH3 25, CMP WNL other than creatinine 1.05 and glucose 122, lipase WNL 8/6 - CXR negative and COVID negative 8/16 - Creatinine 1.42 with GFR 44, glucose 146 and K+ 3.4 otherwise BMP WNL 8/18 - Creatinine 1.19 with GFR 54, glucose 110, and CL 112 and K+ 4.0  Namir Neto, MD 12/11/2021, 10:28 AM

## 2021-12-11 NOTE — Plan of Care (Signed)
  Problem: Safety: Goal: Ability to redirect hostility and anger into socially appropriate behaviors will improve Outcome: Progressing Goal: Ability to remain free from injury will improve Outcome: Progressing   Problem: Self-Care: Goal: Ability to participate in self-care as condition permits will improve Outcome: Progressing

## 2021-12-11 NOTE — BH IP Treatment Plan (Signed)
Interdisciplinary Treatment and Diagnostic Plan Update  12/11/2021 Time of Session: update Natalie Brown MRN: [id number]  Principal Diagnosis: Bipolar affective disorder, current episode manic with psychotic symptoms (H[facility])  Secondary Diagnoses: Principal Problem:   Bipolar affective disorder, current episode manic with psychotic symptoms ([facility]) Active Problems:   Major neurocognitive disorder (H[facility])   Current Medications:  Current Facility-Administered Medications  Medication Dose Route Frequency Provider Last Rate Last Admin   acetaminophen (TYLENOL) tablet 650 mg  650 mg Oral Q6H PRN Lauro Franklin, MD   650 mg at 11/14/21 0759   alum & mag hydroxide-simeth (MAALOX/MYLANTA) 200-200-20 MG/5ML suspension 30 mL  30 mL Oral Q4H PRN Massengill, Harrold Donath, MD       atropine 1 % ophthalmic solution 1 drop  1 drop Sublingual Q12H PRN Mason Jim, Amy E, MD       benztropine (COGENTIN) tablet 0.5 mg  0.5 mg Oral BID Mason Jim, Amy E, MD   0.5 mg at 12/10/21 1643   docusate sodium (COLACE) capsule 100 mg  100 mg Oral Daily Mason Jim, Amy E, MD   100 mg at 12/10/21 3536   donepezil (ARICEPT) tablet 5 mg  5 mg Oral Daily Mason Jim, Amy E, MD   5 mg at 12/10/21 1443   feeding supplement (KATE FARMS STANDARD 1.4) liquid 325 mL  325 mL Oral BID BM Massengill, Harrold Donath, MD   325 mL at 12/10/21 1555   fluticasone (FLONASE) 50 MCG/ACT nasal spray 1 spray  1 spray Each Nare Daily PRN Comer Locket, MD   1 spray at 12/09/21 1540   haloperidol (HALDOL) tablet 2 mg  2 mg Oral BID Comer Locket, MD   2 mg at 12/10/21 2107   hydrocerin (EUCERIN) cream   Topical BID Phineas Inches, MD   Given at 12/10/21 1640   hydrOXYzine (ATARAX) tablet 25 mg  25 mg Oral TID PRN Phineas Inches, MD   25 mg at 12/10/21 2107   LORazepam (ATIVAN) tablet 1 mg  1 mg Oral Q6H PRN Comer Locket, MD   1 mg at 10/07/21 1546   magnesium hydroxide (MILK OF MAGNESIA) suspension 30 mL  30 mL Oral Daily PRN Massengill,  Harrold Donath, MD   30 mL at 11/14/21 0802   nicotine (NICODERM CQ - dosed in mg/24 hours) patch 14 mg  14 mg Transdermal Daily Mason Jim, Amy E, MD   14 mg at 12/10/21 0822   OLANZapine zydis (ZYPREXA) disintegrating tablet 5 mg  5 mg Oral Q8H PRN Comer Locket, MD   5 mg at 10/14/21 1416   And   ziprasidone (GEODON) injection 20 mg  20 mg Intramuscular PRN Comer Locket, MD       ondansetron (ZOFRAN) tablet 4 mg  4 mg Oral Q8H PRN Bartholomew Crews E, MD   4 mg at 11/23/21 0867   polyethylene glycol (MIRALAX / GLYCOLAX) packet 17 g  17 g Oral Daily PRN Comer Locket, MD       propranolol (INDERAL) tablet 10 mg  10 mg Oral BID Mason Jim, Amy E, MD   10 mg at 12/10/21 1641   PTA Medications: Medications Prior to Admission  Medication Sig Dispense Refill Last Dose   CLARITIN 10 MG tablet Take 10 mg by mouth daily.      fluticasone (FLONASE) 50 MCG/ACT nasal spray Place 1 spray into both nostrils daily.      pseudoephedrine-guaifenesin (MUCINEX D) 60-600 MG 12 hr tablet Take 1 tablet by mouth every 12 (twelve)  hours. 30 tablet 0     Patient Stressors: Health problems   Medication change or noncompliance    Patient Strengths: Capable of independent living  Printmaker for treatment/growth  Supportive family/friends   Treatment Modalities: Medication Management, Group therapy, Case management,  1 to 1 session with clinician, Psychoeducation, Recreational therapy.   Physician Treatment Plan for Primary Diagnosis: Bipolar affective disorder, current episode manic with psychotic symptoms (HCC) Long Term Goal(s): Improvement in symptoms so as ready for discharge   Short Term Goals: Ability to identify and develop effective coping behaviors will improve Ability to maintain clinical measurements within normal limits will improve Compliance with prescribed medications will improve Ability to identify changes in lifestyle to reduce recurrence of condition will  improve Ability to verbalize feelings will improve Ability to disclose and discuss suicidal ideas Ability to demonstrate self-control will improve  Medication Management: Evaluate patient's response, side effects, and tolerance of medication regimen.  Therapeutic Interventions: 1 to 1 sessions, Unit Group sessions and Medication administration.  Evaluation of Outcomes: Adequate for Discharge  Physician Treatment Plan for Secondary Diagnosis: Principal Problem:   Bipolar affective disorder, current episode manic with psychotic symptoms (HCC) Active Problems:   Major neurocognitive disorder (HCC)  Long Term Goal(s): Improvement in symptoms so as ready for discharge   Short Term Goals: Ability to identify and develop effective coping behaviors will improve Ability to maintain clinical measurements within normal limits will improve Compliance with prescribed medications will improve Ability to identify changes in lifestyle to reduce recurrence of condition will improve Ability to verbalize feelings will improve Ability to disclose and discuss suicidal ideas Ability to demonstrate self-control will improve     Medication Management: Evaluate patient's response, side effects, and tolerance of medication regimen.  Therapeutic Interventions: 1 to 1 sessions, Unit Group sessions and Medication administration.  Evaluation of Outcomes: Adequate for Discharge   RN Treatment Plan for Primary Diagnosis: Bipolar affective disorder, current episode manic with psychotic symptoms (HCC) Long Term Goal(s): Knowledge of disease and therapeutic regimen to maintain health will improve  Short Term Goals: Ability to remain free from injury will improve, Ability to verbalize frustration and anger appropriately will improve, Ability to demonstrate self-control, Ability to participate in decision making will improve, Ability to verbalize feelings will improve, Ability to disclose and discuss suicidal ideas,  Ability to identify and develop effective coping behaviors will improve, and Compliance with prescribed medications will improve  Medication Management: RN will administer medications as ordered by provider, will assess and evaluate patient's response and provide education to patient for prescribed medication. RN will report any adverse and/or side effects to prescribing provider.  Therapeutic Interventions: 1 on 1 counseling sessions, Psychoeducation, Medication administration, Evaluate responses to treatment, Monitor vital signs and CBGs as ordered, Perform/monitor CIWA, COWS, AIMS and Fall Risk screenings as ordered, Perform wound care treatments as ordered.  Evaluation of Outcomes: Adequate for Discharge   LCSW Treatment Plan for Primary Diagnosis: Bipolar affective disorder, current episode manic with psychotic symptoms (HCC) Long Term Goal(s): Safe transition to appropriate next level of care at discharge, Engage patient in therapeutic group addressing interpersonal concerns.  Short Term Goals: Engage patient in aftercare planning with referrals and resources, Increase social support, Increase ability to appropriately verbalize feelings, Increase emotional regulation, Facilitate acceptance of mental health diagnosis and concerns, Facilitate patient progression through stages of change regarding substance use diagnoses and concerns, Identify triggers associated with mental health/substance abuse issues, and Increase skills for wellness and recovery  Therapeutic Interventions: Assess for all discharge needs, 1 to 1 time with Child psychotherapist, Explore available resources and support systems, Assess for adequacy in community support network, Educate family and significant other(s) on suicide prevention, Complete Psychosocial Assessment, Interpersonal group therapy.  Evaluation of Outcomes: Adequate for Discharge   Progress in Treatment: Attending groups: Yes. Participating in groups: Yes. Taking  medication as prescribed: Yes. Toleration medication: Yes. Family/Significant other contact made: Yes, individual(s) contacted:  guardian and mother Patient understands diagnosis: No. Discussing patient identified problems/goals with staff: Yes. Medical problems stabilized or resolved: Yes. Denies suicidal/homicidal ideation: Yes. Issues/concerns per patient self-inventory: No.   New problem(s) identified: No, Describe:  none reported  New Short Term/Long Term Goal(s): medication stabilization, elimination of SI thoughts, development of comprehensive mental wellness plan.    Patient Goals:  Patient continues to work on initial treatment team goals  Discharge Plan or Barriers:   Reason for Continuation of Hospitalization: Delusions  Medication stabilization  Estimated Length of Stay: Placement.  Patient working on getting benefits  Last 3 [place] Suicide Severity Risk Score: Flowsheet Row Admission (Current) [facility] from 09/05/2021 in [facility] ED from 05/26/2020 in [facility]  C-SSRS RISK CATEGORY High Risk High Risk No Risk       Last PHQ 2/9 Scores:    05/26/2020    2:14 PM 09/03/2017    4:34 PM 08/07/2016    2:12 PM  Depression screen PHQ 2/9  Decreased Interest 3 3 3   Down, Depressed, Hopeless 3 1 2   PHQ - 2 Score 6 4 5   Altered sleeping 0 3 3  Tired, decreased energy 0 3 0  Change in appetite 3 3 0  Feeling bad or failure about yourself  0 3 1  Trouble concentrating 3 0 0  Moving slowly or fidgety/restless 3 0 1  Suicidal thoughts 0 0 0  PHQ-9 Score 15 16 10     Scribe for Treatment Team: , LCSW 12/11/2021 7:52 AM

## 2021-12-11 NOTE — Progress Notes (Signed)
Adult Psychoeducational Group Note  Date:  12/11/2021 Time:  8:57 PM  Group Topic/Focus:  Orientation:   The focus of this group is to educate the patient on the purpose and policies of crisis stabilization and provide a format to answer questions about their admission.  The group details unit policies and expectations of patients while admitted. Wrap-Up Group:   The focus of this group is to help patients review their daily goal of treatment and discuss progress on daily workbooks.  Participation Level:  Did Not Attend  Participation Quality:   Did Not Attend  Affect:   Did Not Attend  Cognitive:   Did Not Attend  Insight: None  Engagement in Group:   Did Not Attend  Modes of Intervention:   Did Not Attend  Additional Comments:  Pt was encouraged to attend wrap up group but did not attend.  Felipa Furnace 12/11/2021, 8:57 PM

## 2021-12-11 NOTE — Progress Notes (Signed)
   12/11/21 0800  Psych Admission Type (Psych Patients Only)  Admission Status Involuntary  Psychosocial Assessment  Patient Complaints Confusion  Eye Contact Fair;Glaring  Facial Expression Anxious  Affect Flat  Speech Soft;Slurred;Slow  Interaction Cautious  Motor Activity Slow  Appearance/Hygiene Disheveled  Behavior Characteristics Anxious  Mood Suspicious;Anxious  Thought Process  Coherency Disorganized  Content Blaming others  Delusions WDL  Perception WDL  Hallucination None reported or observed  Judgment Impaired  Confusion Mild  Danger to Self  Agreement Not to Harm Self Yes  Description of Agreement Verbal  Danger to Others  Danger to Others None reported or observed  Danger to Others Abnormal  Harmful Behavior to others No threats or harm toward other people  Destructive Behavior No threats or harm toward property

## 2021-12-11 NOTE — Progress Notes (Signed)
   12/11/21 0530  Sleep  Number of Hours 7.25    

## 2021-12-11 NOTE — Progress Notes (Signed)
   12/11/21 2145  Psych Admission Type (Psych Patients Only)  Admission Status Involuntary  Psychosocial Assessment  Patient Complaints Confusion  Eye Contact Glaring;Fair  Facial Expression Anxious  Affect Flat  Speech Soft  Interaction Cautious  Motor Activity Slow  Appearance/Hygiene Improved  Behavior Characteristics Anxious  Mood Anxious;Preoccupied;Pleasant  Aggressive Behavior  Effect No apparent injury  Thought Process  Coherency Disorganized  Content WDL  Delusions WDL  Perception WDL  Hallucination None reported or observed  Judgment Impaired  Confusion Mild  Danger to Self  Current suicidal ideation? Denies

## 2021-12-11 NOTE — Progress Notes (Signed)
This Clinical research associate assisted the patient to take her shower today. She changed to clean cloths, and the dirty ones sent to laundry. With much persuasion, we were able to complete tasks. Staff will continue to monitor Patient.

## 2021-12-12 DIAGNOSIS — F312 Bipolar disorder, current episode manic severe with psychotic features: Secondary | ICD-10-CM | POA: Diagnosis not present

## 2021-12-12 NOTE — Progress Notes (Signed)
   12/12/21 2015  Psych Admission Type (Psych Patients Only)  Admission Status Involuntary  Psychosocial Assessment  Patient Complaints Confusion  Eye Contact Glaring;Fair  Facial Expression Anxious  Affect Flat  Speech Soft  Interaction Cautious  Motor Activity Slow  Appearance/Hygiene Improved  Behavior Characteristics Cooperative  Mood Anxious;Suspicious  Aggressive Behavior  Effect No apparent injury  Thought Process  Coherency Disorganized  Content WDL  Delusions WDL  Perception WDL  Hallucination None reported or observed  Judgment Impaired  Confusion Mild  Danger to Self  Current suicidal ideation? Denies

## 2021-12-12 NOTE — Group Note (Signed)
Recreation Therapy Group Note   Group Topic:Team Building  Group Date: 12/12/2021 Start Time: 1000 End Time: 1045 Facilitators: Caroll Rancher, LRT,CTRS Location: [address] Dayroom   Goal Area(s) Addresses:  Patient will effectively work with peer towards shared goal.  Patient will identify skills used to make activity successful.  Patient will identify how skills used during activity can be applied to reach post d/c goals.   Group Description: [employer]. In teams of 5-6, patients were given 12 craft pipe cleaners. Using the materials provided, patients were instructed to compete again the opposing team(s) to build the tallest free-standing structure from floor level. The activity was timed; difficulty increased by Clinical research associate as Production designer, theatre/television/film continued.  Systematically resources were removed with additional directions for example, placing one arm behind their back, working in silence, and shape stipulations. LRT facilitated post-activity discussion reviewing team processes and necessary communication skills involved in completion. Patients were encouraged to reflect how the skills utilized, or not utilized, in this activity can be incorporated to positively impact support systems post discharge.   Affect/Mood: N/A   Participation Level: Did not attend    Clinical Observations/Individualized Feedback:     Plan: Continue to engage patient in RT group sessions 2-3x/week.   Caroll Rancher, Antonietta Jewel 12/12/2021 12:37 PM

## 2021-12-12 NOTE — Progress Notes (Signed)
[facility] LCSW Progress Note   12/12/2021    3:51 PM  Natalie Brown   [id number]   Type of Contact and Topic:  SSI application   CSW contacted Vanessa Kick w/ the Social Security office in [place], [place] at [phone] ext [phone] who states she no longer needs any additional documents/information. She will be submitting application to [facility] for determination. States this process can take up to 9 months for decision.    No further action needed. Situation ongoing, CSW will continue to monitor and update note as more information becomes available.      Signed:  [name], MSW, [place], LCAS 12/12/2021 3:51 PM

## 2021-12-12 NOTE — Group Note (Signed)
LCSW Group Therapy Note  Group Date: 12/12/2021 Start Time: 1300 End Time: 1400   Type of Therapy and Topic:  Group Therapy: Anger Cues and Responses  Participation Level:  Minimal   Description of Group:   In this group, patients learned how to recognize the physical, cognitive, emotional, and behavioral responses they have to anger-provoking situations.  They identified a recent time they became angry and how they reacted.  They analyzed how their reaction was possibly beneficial and how it was possibly unhelpful.  The group discussed a variety of healthier coping skills that could help with such a situation in the future.  Focus was placed on how helpful it is to recognize the underlying emotions to our anger, because working on those can lead to a more permanent solution as well as our ability to focus on the important rather than the urgent.  Therapeutic Goals: Patients will remember their last incident of anger and how they felt emotionally and physically, what their thoughts were at the time, and how they behaved. Patients will identify how their behavior at that time worked for them, as well as how it worked against them. Patients will explore possible new behaviors to use in future anger situations. Patients will learn that anger itself is normal and cannot be eliminated, and that healthier reactions can assist with resolving conflict rather than worsening situations.  Summary of Patient Progress:  Natalie Brown participated appropriately in group.  Natalie Brown  shared a recent occurrence wherein feeling stuck at the hospital led to anger. She demonstrated fair insight into the subject matter, was respectful of peers, and participated throughout the entire session.  Therapeutic Modalities:   Cognitive Behavioral Therapy    Beatris Si, LCSW 12/12/2021  2:04 PM

## 2021-12-12 NOTE — Progress Notes (Signed)
   12/12/21 0530  Sleep  Number of Hours 8.5

## 2021-12-12 NOTE — [facility] Group Notes (Signed)
Adult Psychoeducational Group Note  Date:  12/12/2021 Time:  9:47 AM  Group Topic/Focus:  Goals Group:   The focus of this group is to help patients establish daily goals to achieve during treatment and discuss how the patient can incorporate goal setting into their daily lives to aide in recovery.  Participation Level:  Active  Participation Quality:  Appropriate  Additional Comments: Pt has a goal to take all her medications so she can move closer to her discharge.   [name] 12/12/2021, 9:47 AM

## 2021-12-12 NOTE — Progress Notes (Signed)
[facility] MD Progress Note  12/12/2021 10:24 AM Natalie Brown  MRN:  [id number]  Reason for Admission:  Natalie Brown is a 56 y.o. female  who was initially admitted for inpatient psychiatric hospitalization on 09/06/2021 for management of mania, disorganized behaviors/thinking, and delusions. The patient is currently on Hospital Day 97.   Chart Review from last 24 hours:  The patient's chart was reviewed and nursing notes were reviewed. The patient's case was discussed in multidisciplinary team meeting.  Patient is compliant with her medications, as needed Atarax for anxiety was given once yesterday, also using Flonase as needed.  Information Obtained Today During Patient Interview: The patient was interviewed in her room, primarily asleep but easily awakened, answering questions in a linear manner but occasionally seems confused at baseline.  She denies any physical complaints, denies feeling depressed or anxious, presents calm, denies SI HI or AVH, reports good sleep and appetite.  She was counseled again today regarding need to clean her room and change her clothes and she agrees.  [name] oriented to being in the hospital then becomes a preoccupied "I want to be discharged" she is disoriented to situation and confused about why she is still in the hospital unable to elaborate any details despite it was explained to her several times.  [name] oriented to the city but when asked about the state keeps repeating [place] until redirected that it is not the state, disoriented/confused about the day of the week "it is weekend" and instead of Wednesday, oriented to the month correctly, confused about the year but oriented with delay, I do not know may be about 23"  Principal Problem: Bipolar affective disorder, current episode manic with psychotic symptoms ([place]) Diagnosis: Principal Problem:   Bipolar affective disorder, current episode manic with psychotic symptoms ([place]) Active Problems:   Major  neurocognitive disorder ([place])  Past Psychiatric History: see H&P  Past Medical History:  Past Medical History:  Diagnosis Date   Bipolar 1 disorder with moderate mania ([place]) 12/30/2013   Hypertension    Family History: see H&P  Family Psychiatric  History: see H&P  Social History:  Homeless; mother is interim guardian   Sleep: Good - 6.5 hours  Appetite:  Good  Current Medications: Current Facility-Administered Medications  Medication Dose Route Frequency Provider Last Rate Last Admin   acetaminophen (TYLENOL) tablet 650 mg  650 mg Oral Q6H PRN Briant Cedar, MD   650 mg at 11/14/21 0759   alum & mag hydroxide-simeth (MAALOX/MYLANTA) 200-200-20 MG/5ML suspension 30 mL  30 mL Oral Q4H PRN Massengill, [name], MD       atropine 1 % ophthalmic solution 1 drop  1 drop Sublingual Q8H PRN Nayib Remer, MD   1 drop at 12/11/21 1504   benztropine (COGENTIN) tablet 0.5 mg  0.5 mg Oral BID Nelda Marseille, Amy E, MD   0.5 mg at 12/12/21 0844   docusate sodium (COLACE) capsule 100 mg  100 mg Oral Daily Nelda Marseille, Amy E, MD   100 mg at 12/12/21 0844   donepezil (ARICEPT) tablet 5 mg  5 mg Oral Daily Nelda Marseille, Amy E, MD   5 mg at 12/12/21 0848   feeding supplement (KATE FARMS STANDARD 1.4) liquid 325 mL  325 mL Oral BID BM Massengill, [name], MD   325 mL at 12/11/21 0811   fluticasone (FLONASE) 50 MCG/ACT nasal spray 1 spray  1 spray Each Nare Daily PRN Harlow Asa, MD   1 spray at 12/11/21 [phone]  haloperidol (HALDOL) tablet 2 mg  2 mg Oral BID Harlow Asa, MD   2 mg at 12/12/21 2694   hydrocerin (EUCERIN) cream   Topical BID Janine Limbo, MD   Given at 12/12/21 [phone]   hydrOXYzine (ATARAX) tablet 25 mg  25 mg Oral TID PRN Janine Limbo, MD   25 mg at 12/11/21 2031   LORazepam (ATIVAN) tablet 1 mg  1 mg Oral Q6H PRN Harlow Asa, MD   1 mg at 10/07/21 1546   magnesium hydroxide (MILK OF MAGNESIA) suspension 30 mL  30 mL Oral Daily PRN Massengill, [name], MD   30 mL  at 11/14/21 0802   nicotine (NICODERM CQ - dosed in mg/24 hours) patch 14 mg  14 mg Transdermal Daily Nelda Marseille, Amy E, MD   14 mg at 12/11/21 0811   OLANZapine zydis (ZYPREXA) disintegrating tablet 5 mg  5 mg Oral Q8H PRN Harlow Asa, MD   5 mg at 10/14/21 1416   And   ziprasidone (GEODON) injection 20 mg  20 mg Intramuscular PRN Harlow Asa, MD       ondansetron [facility]) tablet 4 mg  4 mg Oral Q8H PRN Harlow Asa, MD   4 mg at 11/23/21 2703   polyethylene glycol (MIRALAX / GLYCOLAX) packet 17 g  17 g Oral Daily PRN Harlow Asa, MD       propranolol (INDERAL) tablet 10 mg  10 mg Oral BID Harlow Asa, MD   10 mg at 12/12/21 5009   Lab Results:  No results found for this or any previous visit (from the past 96 hour(s)).   Blood Alcohol level:  Lab Results  Component Value Date   ETH <10 38/18/2993   Metabolic Disorder Labs: Lab Results  Component Value Date   HGBA1C 5.3 09/08/2021   MPG 105.41 09/08/2021   No results found for: "PROLACTIN" Lab Results  Component Value Date   CHOL 173 09/08/2021   TRIG 110 09/08/2021   HDL 50 09/08/2021   CHOLHDL 3.5 09/08/2021   VLDL 22 09/08/2021   LDLCALC 101 (H) 09/08/2021   LDLCALC 102 (H) 06/21/2020   Physical Findings:  AIMS: Facial and Oral Movements Muscles of Facial Expression: None, normal Lips and Perioral Area: None, normal Jaw: None, normal Tongue: None, normal,Extremity Movements Upper (arms, wrists, hands, fingers): None, normal Lower (legs, knees, ankles, toes): None, normal, Trunk Movements Neck, shoulders, hips: None, normal, Overall Severity Severity of abnormal movements (highest score from questions above): None, normal Incapacitation due to abnormal movements: None, normal Patient's awareness of abnormal movements (rate only patient's report): No Awareness, Dental Status Current problems with teeth and/or dentures?: No Does patient usually wear dentures?: No   Musculoskeletal: Strength &  Muscle Tone: normal Gait & Station: normal Patient leans: N/A  Psychiatric Specialty Exam:  Presentation  General Appearance: unkempt appearing  - wearing hair bonnet, wearing same dirty clothes for last several days - face appears unwashed and mildly malodorous  Eye Contact:Fair - glaring quality at times  Speech: speaks in short phrases to direct questions  Speech Volume:Normal  Mood and Affect  Mood:appears mildly anxious and confused  Affect: calm, polite, confused appearing  Thought Process  Thought Processes: concrete, confused at times  Orientation: Partially oriented to time place and situation  Thought Content: Denies SI HI or AVH, does not present paranoid or responding to stimuli. Hallucinations:Denied  Ideas of Reference:Denied  Suicidal Thoughts:Denied   Homicidal Thoughts:Denied   Sensorium  Memory:Poor  Judgment:Poor  Insight:Poor  Executive Functions  Concentration:Poor  Attention Span:Poor  Recall:Poor  Fund of Knowledge:Limited  Language:Fair  Psychomotor Activity  Psychomotor Activity:no restlessness, akathisia or tremors noted, AIMS 0  Assets  Assets:Resilience  Physical Exam Vitals and nursing note reviewed.  Constitutional:      Appearance: Normal appearance.  HENT:     Head: Normocephalic.     Mouth/Throat:     Comments: Increased drooling on exam Pulmonary:     Effort: Pulmonary effort is normal.  Neurological:     General: No focal deficit present.     Mental Status: [name] alert.    Review of Systems  Constitutional: Negative.   HENT: Negative.    Eyes: Negative.   Respiratory: Negative.  Negative for shortness of breath.   Cardiovascular: Negative.  Negative for chest pain and palpitations.  Gastrointestinal: Negative.  Negative for abdominal pain, constipation, diarrhea, nausea and vomiting.  Genitourinary: Negative.   Musculoskeletal: Negative.   Skin: Negative.   Neurological: Negative.  Negative for  dizziness and headaches.   Blood pressure (!) 130/94, pulse (!) 111, temperature (!) 97.1 F (36.2 C), resp. rate 18, height _0  (1.676 m), weight 72.6 kg, SpO2 100 %. Body mass index is 25.83 kg/m.  Treatment Plan Summary:  Diagnoses / Active Problems: Bipolar I MRE manic with psychotic features Major Neurocognitive d/o with behavioral disturbance  PLAN: Safety and Monitoring:  -- Voluntary admission (signed in by guardian for treatment on 11/19/21) to inpatient psychiatric unit for safety, stabilization and treatment  -- Daily contact with patient to assess and evaluate symptoms and progress in treatment  -- Patient's case to be discussed in multi-disciplinary team meeting  -- Observation Level : q15 minute checks  -- Vital signs:  q12 hours  -- Precautions: suicide, elopement, and assault  -- Guardian requests family meeting this week with SW and MD with med print out and plans to discuss updates in person- SW notified of request to coordinate, will follow.  2. Psychiatric Diagnoses and Treatment:   Bipolar I MRE manic with psychotic features  Major neurocognitive disorder with behavioral disturbance (marked for age brain atrophy on CT head and MOCA 11/30) -Continue Aricept 5 mg daily for major neurocognitive disorder with plan to titrate to 10 mg on 9/4   -Continue Haldol 70m bid - (discussed with guardian and will continue present dose with hope of attempting to taper off in future if tolerated) - Continue Cogentin 0.563mbid and monitor for EPS -Continue atropine ophthalmologic gtts 1 drop q8 hour SL PRN drooling (guardian consents to med trial) -Continue Inderal 1040mid for help with anxiety and tachycardia (guardian consents to med trial) -InLorayne Benderstenna 234 mg IM given on 09-30-21, 156m82m given 10/12/20 - Invega 78 mg given on 7/21; Invega 78mg40men 8/18 with consent by guardian -- Zyprexa/Geodon/ativan agitation protocol PRN   --Additional psychosis labs (HIV- , RPR-,  ANA-, heavy metal WNL, ESR wnl, ceruloplasmin wnl and CT head shows Marked for age brain atrophy.  No acute or reversible finding.)   --Depakote, Ativan, and Zyprexa stopped -- ACTT referral pending -- Continue Aricept 5mg d62my for memory issues -- Interim guardianship in place with mother and waiting on SSDI and medicaid for group home referral -- working on prompts to allow staff to assist with ADLs and hygiene but she is resistant  3. Medical Issues Being Addressed:   Tobacco Use Disorder  -- Nicotine patch 14mg/215murs ordered and d/c nicotine gum  Hyperammonemia -resolved --Ammonia level 30 on 10/27/21 and 25 on 8/4 --Depakote was stopped     Elevated creatinine  --Creatinine of 1.42 on 8/16 - on repeat today 1.19 with GFR 54 -- Continue to encourage po fluids and trend     Seasonal allergies --Restart Flonase PRN per guardian approval  Constipation - improved -- Continue colace 125m qd dosing  -- PRN Miralax for severe constipation  Nausea/Vomiting -resolved -- SLP saw for swallow assessment and feels is cognitively based dysphagia - no limitations continue regular diet and thin liquid -- sitter for meals to help with swallowing/portion control   Tachycardia  -- Rechecking EKG and starting Inderal 155mbid - checking orthostatic vitals  Hypokalemia - resolved - Received K+ 20 meq bid and repeat BMP 8/18 shows K+ 4.0  4. Discharge Planning:   -- Social work and case management to assist with discharge planning and identification of hospital follow-up needs prior to discharge  -- Discharge Concerns: Need to establish a safety plan; Medication compliance and effectiveness; safe housing   -- Discharge Goals: ALF or group home with outpatient referrals for mental health follow-up including medication management/psychotherapy  Labs On Admission- CMP: WNL except Cr: 1.1,  GFR: 60,  CBC: WNL,  UDS: Neg,  Troponin: 3,  EtOH: Neg,  beta hCG: 6.4,  Resp Panel: Neg 5/29- Urine  Preg: Neg,  A1c: 5.3,  TSH: 1.719,  Lipid Panel: WNL except LDL: 101 5/24- BMP: WNL except Cr: 1.01,  Ca: 8.4,  hCG: 5,  Urine Preg: Neg 5/28- Ammonia: 32,  CBC: WNL,  CMP: WNL except  Cr: 1.08,  Ca: 8.6,  Depakote lvl: 20 6/3- Depakote lvl: 64, Hep Function: WNL,  CBC: WNL except Hem: 11.9 6/7- Depakote lvl: 75 6/18- UA: Neg,  CBC: WNL,  Depakote lvl: 136,  Ammonia: 99,  BMP: WNL except BUN: 22,  Cr: 1.19,  Ca: 8.6,  GFR: 54,  Hep Function: WNL except Albu: 3.3,  Indirect Bili: 0.2 6/20- ANA: Neg,  Ammonia: 48,  Ceruloplasmin: 24.2,  CMP: WNL except BUN: 23,  Cr: 1.35,  Ca: 8.3,  Albu: 3.4  HIV:Neg,  RPR: Neg,  Sed Rate: 6,  Depakote lvl: 25,  Heavy Metal: Neg 6/23- Ammonia: 46,  BMP: WNL except Cr: 1.2,  Ca: 8.3,  GFR: 54 6/24- UA: WNL except Bacteria: rare 6/30- CMP:  WNL except Cr: 1.04,  Ca: 8.6,  CBC: WNL,  Ammonia: 41, EKG: NSR with Qtc: 399 7/8 - CMP WNL other than CR 1.33 and GFR 48; Ammonia 30 7/18 - Cr of 1.22, eGFR of 53 7/23 - EKG with Qtc of 422 7/31 - creatinine 1.08 8/1 - creatinine 1.24 8/3 - COVID negative 8/4 - CBC WNL, NH3 25, CMP WNL other than creatinine 1.05 and glucose 122, lipase WNL 8/6 - CXR negative and COVID negative 8/16 - Creatinine 1.42 with GFR 44, glucose 146 and K+ 3.4 otherwise BMP WNL 8/18 - Creatinine 1.19 with GFR 54, glucose 110, and CL 112 and K+ 4.0  Brayley Mackowiak, MD 12/12/2021, 10:24 AM

## 2021-12-12 NOTE — Progress Notes (Signed)
Patient ID: Natalie Brown, female   DOB: 12-29-1965, 56 y.o.   MRN: [id number]   12/12/2021 @ 11:30am  [facility]  Type of Contact and Topic: SSI application  CSW assisted patient with calling SSA office and speak with Vanessa Kick, [phone] ext [phone].  Vanessa Kick asked questions on SSI applications for clarity.  Patient answered best of ability.  Patient application has been sent to [facility] for ongoing determination.  States that it could be a process of up to 9 months before a decision is made.    All additional forms or further information will be sent to patient caseworker, [name] at the [facility]. No further action at this time.     Johnatha Zeidman, LCSW, LCAS Clincal Social Worker  [facility]

## 2021-12-12 NOTE — BHH Group Notes (Signed)
Adult Psychoeducational Group Note  Date:  12/12/2021 Time:  8:28 PM  Group Topic/Focus:  Healthy Communication:   The focus of this group is to discuss communication, barriers to communication, as well as healthy ways to communicate with others.  Participation Level:  Active  Participation Quality:  Inattentive  Affect:  Lethargic  Cognitive:  Lacking  Insight: Lacking  Engagement in Group:  Off Topic  Modes of Intervention:  Discussion  Additional Comments  Jacalyn Lefevre 12/12/2021, 8:28 PM

## 2021-12-13 DIAGNOSIS — F312 Bipolar disorder, current episode manic severe with psychotic features: Secondary | ICD-10-CM | POA: Diagnosis not present

## 2021-12-13 NOTE — Progress Notes (Signed)
Patient did not attend morning orientation/goal setting group 

## 2021-12-13 NOTE — Progress Notes (Signed)
   12/13/21 2015  Psych Admission Type (Psych Patients Only)  Admission Status Involuntary  Psychosocial Assessment  Patient Complaints Confusion  Eye Contact Glaring;Fair  Facial Expression Anxious  Affect Flat  Speech Soft  Interaction Cautious  Motor Activity Slow  Appearance/Hygiene Improved  Behavior Characteristics Cooperative  Mood Anxious;Suspicious  Aggressive Behavior  Effect No apparent injury  Thought Process  Coherency Disorganized  Content WDL  Delusions WDL  Perception WDL  Hallucination None reported or observed  Judgment Impaired  Confusion Mild  Danger to Self  Current suicidal ideation? Denies

## 2021-12-13 NOTE — Progress Notes (Signed)
   12/13/21 0515  Sleep  Number of Hours 7.25

## 2021-12-13 NOTE — BHH Group Notes (Signed)
Adult Psychoeducational Group Note  Date:  12/13/2021 Time:  8:20 PM  Group Topic/Focus:  Wrap-Up Group:   The focus of this group is to help patients review their daily goal of treatment and discuss progress on daily workbooks.  Participation Level:  None  Participation Quality:  Inattentive  Affect:  Not Congruent  Cognitive:  Confused  Insight: None  Engagement in Group:  None  Modes of Intervention:  Discussion  Additional Comments:  Patient attended but did not participate in the Wrap-up group.  [name] 12/13/2021, 8:20 PM

## 2021-12-13 NOTE — Progress Notes (Signed)
[facility] MD Progress Note  12/13/2021 11:45 AM Natalie Brown  MRN:  [id number]  Reason for Admission:  Natalie Brown is a 56 y.o. female  who was initially admitted for inpatient psychiatric hospitalization on 09/06/2021 for management of mania, disorganized behaviors/thinking, and delusions. The patient is currently on Hospital Day 98.   Chart Review from last 24 hours:  The patient's chart was reviewed and nursing notes were reviewed. The patient's case was discussed in multidisciplinary team meeting.  Patient is compliant with her medications, as needed Atarax for anxiety was given once yesterday, also using Flonase as needed.  Information Obtained Today During Patient Interview: The patient was interviewed in her room, lying down in bed with her eyes open, answering questions in a linear manner but occasionally seems confused at baseline.  She denies any physical complaints, denies feeling depressed or anxious, presents calm, denies SI HI or AVH, reports good sleep and appetite. Reportedly still drooling mainly secondary to keeping food in her mouth, using atropine drops prn, used some this morning.  Principal Problem: Bipolar affective disorder, current episode manic with psychotic symptoms ([place]) Diagnosis: Principal Problem:   Bipolar affective disorder, current episode manic with psychotic symptoms ([place]) Active Problems:   Major neurocognitive disorder ([place])  Past Psychiatric History: see H&P  Past Medical History:  Past Medical History:  Diagnosis Date   Bipolar 1 disorder with moderate mania ([place]) 12/30/2013   Hypertension    Family History: see H&P  Family Psychiatric  History: see H&P  Social History:  Homeless; mother is interim guardian   Sleep: Good - 6.5 hours  Appetite:  Good  Current Medications: Current Facility-Administered Medications  Medication Dose Route Frequency Provider Last Rate Last Admin   acetaminophen (TYLENOL) tablet 650 mg  650 mg Oral Q6H PRN  Briant Cedar, MD   650 mg at 11/14/21 0759   alum & mag hydroxide-simeth (MAALOX/MYLANTA) 200-200-20 MG/5ML suspension 30 mL  30 mL Oral Q4H PRN Massengill, [name], MD       atropine 1 % ophthalmic solution 1 drop  1 drop Sublingual Q8H PRN Winfred Leeds, Bennette Hasty, MD   1 drop at 12/13/21 0814   benztropine (COGENTIN) tablet 0.5 mg  0.5 mg Oral BID Nelda Marseille, Amy E, MD   0.5 mg at 12/13/21 0811   docusate sodium (COLACE) capsule 100 mg  100 mg Oral Daily Nelda Marseille, Amy E, MD   100 mg at 12/13/21 2536   donepezil (ARICEPT) tablet 5 mg  5 mg Oral Daily Nelda Marseille, Amy E, MD   5 mg at 12/13/21 0811   feeding supplement (KATE FARMS STANDARD 1.4) liquid 325 mL  325 mL Oral BID BM Massengill, [name], MD   325 mL at 12/11/21 0811   fluticasone (FLONASE) 50 MCG/ACT nasal spray 1 spray  1 spray Each Nare Daily PRN Harlow Asa, MD   1 spray at 12/13/21 6440   haloperidol (HALDOL) tablet 2 mg  2 mg Oral BID Harlow Asa, MD   2 mg at 12/13/21 3474   hydrocerin (EUCERIN) cream   Topical BID Janine Limbo, MD   1 Application at 25/95/63 0813   hydrOXYzine (ATARAX) tablet 25 mg  25 mg Oral TID PRN Janine Limbo, MD   25 mg at 12/12/21 2039   LORazepam (ATIVAN) tablet 1 mg  1 mg Oral Q6H PRN Harlow Asa, MD   1 mg at 10/07/21 1546   magnesium hydroxide (MILK OF MAGNESIA) suspension 30 mL  30 mL Oral Daily  PRN Janine Limbo, MD   30 mL at 11/14/21 0802   nicotine (NICODERM CQ - dosed in mg/24 hours) patch 14 mg  14 mg Transdermal Daily Nelda Marseille, Amy E, MD   14 mg at 12/13/21 0814   OLANZapine zydis (ZYPREXA) disintegrating tablet 5 mg  5 mg Oral Q8H PRN Harlow Asa, MD   5 mg at 10/14/21 1416   And   ziprasidone (GEODON) injection 20 mg  20 mg Intramuscular PRN Harlow Asa, MD       ondansetron [facility]) tablet 4 mg  4 mg Oral Q8H PRN Harlow Asa, MD   4 mg at 11/23/21 6948   polyethylene glycol (MIRALAX / GLYCOLAX) packet 17 g  17 g Oral Daily PRN Harlow Asa, MD        propranolol (INDERAL) tablet 10 mg  10 mg Oral BID Harlow Asa, MD   10 mg at 12/13/21 5462   Lab Results:  No results found for this or any previous visit (from the past 34 hour(s)).   Blood Alcohol level:  Lab Results  Component Value Date   ETH <10 70/35/0093   Metabolic Disorder Labs: Lab Results  Component Value Date   HGBA1C 5.3 09/08/2021   MPG 105.41 09/08/2021   No results found for: "PROLACTIN" Lab Results  Component Value Date   CHOL 173 09/08/2021   TRIG 110 09/08/2021   HDL 50 09/08/2021   CHOLHDL 3.5 09/08/2021   VLDL 22 09/08/2021   LDLCALC 101 (H) 09/08/2021   LDLCALC 102 (H) 06/21/2020   Physical Findings:  AIMS: Facial and Oral Movements Muscles of Facial Expression: None, normal Lips and Perioral Area: None, normal Jaw: None, normal Tongue: None, normal,Extremity Movements Upper (arms, wrists, hands, fingers): None, normal Lower (legs, knees, ankles, toes): None, normal, Trunk Movements Neck, shoulders, hips: None, normal, Overall Severity Severity of abnormal movements (highest score from questions above): None, normal Incapacitation due to abnormal movements: None, normal Patient's awareness of abnormal movements (rate only patient's report): No Awareness, Dental Status Current problems with teeth and/or dentures?: No Does patient usually wear dentures?: No   Musculoskeletal: Strength & Muscle Tone: normal Gait & Station: normal Patient leans: N/A  Psychiatric Specialty Exam:  Presentation  General Appearance: unkempt appearing  - wearing hair bonnet, wearing same dirty clothes for last several days - face appears unwashed and mildly malodorous  Eye Contact:Fair - glaring quality at times  Speech: speaks in short phrases to direct questions  Speech Volume:Normal  Mood and Affect  Mood:appears mildly anxious and confused  Affect: calm, polite, confused appearing  Thought Process  Thought Processes: concrete, confused at  times  Orientation: Partially oriented to time place and situation  Thought Content: Denies SI HI or AVH, does not present paranoid or responding to stimuli. Hallucinations:Denied  Ideas of Reference:Denied  Suicidal Thoughts:Denied   Homicidal Thoughts:Denied   Sensorium  Memory:Poor  Judgment:Poor  Insight:Poor  Executive Functions  Concentration:Poor  Attention Span:Poor  Recall:Poor  Fund of Knowledge:Limited  Language:Fair  Psychomotor Activity  Psychomotor Activity:no restlessness, akathisia or tremors noted, AIMS 0  Assets  Assets:Resilience  Physical Exam Vitals and nursing note reviewed.  Constitutional:      Appearance: Normal appearance.  HENT:     Head: Normocephalic.     Mouth/Throat:     Comments: Increased drooling on exam Pulmonary:     Effort: Pulmonary effort is normal.  Neurological:     General: No focal deficit present.  Mental Status: [name] alert.    Review of Systems  Constitutional: Negative.   HENT: Negative.    Eyes: Negative.   Respiratory: Negative.  Negative for shortness of breath.   Cardiovascular: Negative.  Negative for chest pain and palpitations.  Gastrointestinal: Negative.  Negative for abdominal pain, constipation, diarrhea, nausea and vomiting.  Genitourinary: Negative.   Musculoskeletal: Negative.   Skin: Negative.   Neurological: Negative.  Negative for dizziness and headaches.   Blood pressure 119/79, pulse (!) 105, temperature 97.7 F (36.5 C), temperature source Oral, resp. rate 16, height 5' 6"  (1.676 m), weight 72.6 kg, SpO2 99 %. Body mass index is 25.83 kg/m.  Treatment Plan Summary:  Diagnoses / Active Problems: Bipolar I MRE manic with psychotic features Major Neurocognitive d/o with behavioral disturbance  PLAN: Safety and Monitoring:  -- Voluntary admission (signed in by guardian for treatment on 11/19/21) to inpatient psychiatric unit for safety, stabilization and treatment  -- Daily  contact with patient to assess and evaluate symptoms and progress in treatment  -- Patient's case to be discussed in multi-disciplinary team meeting  -- Observation Level : q15 minute checks  -- Vital signs:  q12 hours  -- Precautions: suicide, elopement, and assault  -- Guardian requests family meeting this week with SW and MD with med print out and plans to discuss updates in person- SW notified of request to coordinate, will follow.  2. Psychiatric Diagnoses and Treatment:   Bipolar I MRE manic with psychotic features  Major neurocognitive disorder with behavioral disturbance (marked for age brain atrophy on CT head and MOCA 11/30) -Continue Aricept 5 mg daily for major neurocognitive disorder with plan to titrate to 10 mg on 9/4   -Continue Haldol 45m bid - (discussed with guardian and will continue present dose with hope of attempting to taper off in future if tolerated) - Continue Cogentin 0.532mbid and monitor for EPS -Continue atropine ophthalmologic gtts 1 drop q8 hour SL PRN drooling (guardian consents to med trial) -Continue Inderal 1017mid for help with anxiety and tachycardia (guardian consents to med trial) -InLorayne Benderstenna 234 mg IM given on 09-30-21, 156m77m given 10/12/20 - Invega 78 mg given on 7/21; Invega 78mg2men 8/18 with consent by guardian -- Zyprexa/Geodon/ativan agitation protocol PRN   --Additional psychosis labs (HIV- , RPR-, ANA-, heavy metal WNL, ESR wnl, ceruloplasmin wnl and CT head shows Marked for age brain atrophy.  No acute or reversible finding.)   --Depakote, Ativan, and Zyprexa stopped -- ACTT referral pending -- Continue Aricept 5mg d57my for memory issues -- Interim guardianship in place with mother and waiting on SSDI and medicaid for group home referral -- working on prompts to allow staff to assist with ADLs and hygiene but she is resistant  3. Medical Issues Being Addressed:   Tobacco Use Disorder  -- Nicotine patch 14mg/248murs ordered and  d/c nicotine gum    Hyperammonemia -resolved --Ammonia level 30 on 10/27/21 and 25 on 8/4 --Depakote was stopped     Elevated creatinine  --Creatinine of 1.42 on 8/16 - on repeat today 1.19 with GFR 54 -- Continue to encourage po fluids and trend     Seasonal allergies --Restart Flonase PRN per guardian approval  Constipation - improved -- Continue colace 100mg qd71ming  -- PRN Miralax for severe constipation  Nausea/Vomiting -resolved -- SLP saw for swallow assessment and feels is cognitively based dysphagia - no limitations continue regular diet and thin liquid -- sitter for meals  to help with swallowing/portion control   Tachycardia  -- Rechecking EKG and starting Inderal 95m bid - checking orthostatic vitals  Hypokalemia - resolved - Received K+ 20 meq bid and repeat BMP 8/18 shows K+ 4.0  4. Discharge Planning:   -- Social work and case management to assist with discharge planning and identification of hospital follow-up needs prior to discharge  -- Discharge Concerns: Need to establish a safety plan; Medication compliance and effectiveness; safe housing   -- Discharge Goals: ALF or group home with outpatient referrals for mental health follow-up including medication management/psychotherapy  Labs On Admission- CMP: WNL except Cr: 1.1,  GFR: 60,  CBC: WNL,  UDS: Neg,  Troponin: 3,  EtOH: Neg,  beta hCG: 6.4,  Resp Panel: Neg 5/29- Urine Preg: Neg,  A1c: 5.3,  TSH: 1.719,  Lipid Panel: WNL except LDL: 101 5/24- BMP: WNL except Cr: 1.01,  Ca: 8.4,  hCG: 5,  Urine Preg: Neg 5/28- Ammonia: 32,  CBC: WNL,  CMP: WNL except  Cr: 1.08,  Ca: 8.6,  Depakote lvl: 20 6/3- Depakote lvl: 64, Hep Function: WNL,  CBC: WNL except Hem: 11.9 6/7- Depakote lvl: 75 6/18- UA: Neg,  CBC: WNL,  Depakote lvl: 136,  Ammonia: 99,  BMP: WNL except BUN: 22,  Cr: 1.19,  Ca: 8.6,  GFR: 54,  Hep Function: WNL except Albu: 3.3,  Indirect Bili: 0.2 6/20- ANA: Neg,  Ammonia: 48,  Ceruloplasmin: 24.2,  CMP:  WNL except BUN: 23,  Cr: 1.35,  Ca: 8.3,  Albu: 3.4  HIV:Neg,  RPR: Neg,  Sed Rate: 6,  Depakote lvl: 25,  Heavy Metal: Neg 6/23- Ammonia: 46,  BMP: WNL except Cr: 1.2,  Ca: 8.3,  GFR: 54 6/24- UA: WNL except Bacteria: rare 6/30- CMP:  WNL except Cr: 1.04,  Ca: 8.6,  CBC: WNL,  Ammonia: 41, EKG: NSR with Qtc: 399 7/8 - CMP WNL other than CR 1.33 and GFR 48; Ammonia 30 7/18 - Cr of 1.22, eGFR of 53 7/23 - EKG with Qtc of 422 7/31 - creatinine 1.08 8/1 - creatinine 1.24 8/3 - COVID negative 8/4 - CBC WNL, NH3 25, CMP WNL other than creatinine 1.05 and glucose 122, lipase WNL 8/6 - CXR negative and COVID negative 8/16 - Creatinine 1.42 with GFR 44, glucose 146 and K+ 3.4 otherwise BMP WNL 8/18 - Creatinine 1.19 with GFR 54, glucose 110, and CL 112 and K+ 4.0  Jentry Mcqueary, MD 12/13/2021, 11:45 AM

## 2021-12-14 DIAGNOSIS — F312 Bipolar disorder, current episode manic severe with psychotic features: Secondary | ICD-10-CM | POA: Diagnosis not present

## 2021-12-14 NOTE — Group Note (Signed)
LCSW Group Therapy Note  Group Date: 12/14/2021 Start Time: 1300 End Time: 1400   Type of Therapy and Topic:  Group Therapy - How To Cope with Nervousness about Discharge   Participation Level:  Did Not Attend   Description of Group This process group involved identification of patients' feelings about discharge. Some of them are scheduled to be discharged soon, while others are new admissions, but each of them was asked to share thoughts and feelings surrounding discharge from the hospital. One common theme was that they are excited at the prospect of going home, while another was that many of them are apprehensive about sharing why they were hospitalized. Patients were given the opportunity to discuss these feelings with their peers in preparation for discharge.  Therapeutic Goals  Patient will identify their overall feelings about pending discharge. Patient will think about how they might proactively address issues that they believe will once again arise once they get home (i.e. with parents). Patients will participate in discussion about having hope for change.   Summary of Patient Progress:  Did not attend   Therapeutic Modalities Cognitive Behavioral Therapy   Beatris Si, LCSW 12/14/2021  12:09 PM

## 2021-12-14 NOTE — Progress Notes (Signed)
[facility] MD Progress Note  12/14/2021 10:29 AM Natalie Brown  MRN:  [id number]  Reason for Admission:  Natalie Brown is a 56 y.o. female  who was initially admitted for inpatient psychiatric hospitalization on 09/06/2021 for management of mania, disorganized behaviors/thinking, and delusions. The patient is currently on Hospital Day 99.   Chart Review from last 24 hours:  The patient's chart was reviewed and nursing notes were reviewed. The patient's case was discussed in multidisciplinary team meeting.  Patient is compliant with her medications, as needed Atarax for anxiety was given once yesterday, also using Flonase as needed.  Information Obtained Today During Patient Interview: The patient was interviewed in her room, prior to interviewing her patient was taking medicine in front of medication window appropriately with some drooling noted, answering questions in a linear manner but occasionally seems confused at baseline.  She denies any physical complaints, denies feeling depressed or anxious, presents calm, denies SI HI or AVH, reports good sleep and appetite. Reportedly still drooling mainly secondary to keeping food in her mouth, using atropine drops prn, used some this morning.  Today she started asking me regarding her discharge planning and reports hoping for discharge "before the end of this month" I did discuss with her again discharge planning and current process awaiting getting approved for benefits to be able to refer her for group homes.  Principal Problem: Bipolar affective disorder, current episode manic with psychotic symptoms ([place]) Diagnosis: Principal Problem:   Bipolar affective disorder, current episode manic with psychotic symptoms ([place]) Active Problems:   Major neurocognitive disorder ([place])  Past Psychiatric History: see H&P  Past Medical History:  Past Medical History:  Diagnosis Date   Bipolar 1 disorder with moderate mania ([place]) 12/30/2013   Hypertension     Family History: see H&P  Family Psychiatric  History: see H&P  Social History:  Homeless; mother is interim guardian   Sleep: Good - 6.5 hours  Appetite:  Good  Current Medications: Current Facility-Administered Medications  Medication Dose Route Frequency Provider Last Rate Last Admin   acetaminophen (TYLENOL) tablet 650 mg  650 mg Oral Q6H PRN Briant Cedar, MD   650 mg at 11/14/21 0759   alum & mag hydroxide-simeth (MAALOX/MYLANTA) 200-200-20 MG/5ML suspension 30 mL  30 mL Oral Q4H PRN Massengill, [name], MD       atropine 1 % ophthalmic solution 1 drop  1 drop Sublingual Q8H PRN Winfred Leeds, Amandajo Gonder, MD   1 drop at 12/13/21 0814   benztropine (COGENTIN) tablet 0.5 mg  0.5 mg Oral BID Nelda Marseille, Amy E, MD   0.5 mg at 12/14/21 0805   docusate sodium (COLACE) capsule 100 mg  100 mg Oral Daily Nelda Marseille, Amy E, MD   100 mg at 12/14/21 0805   donepezil (ARICEPT) tablet 5 mg  5 mg Oral Daily Nelda Marseille, Amy E, MD   5 mg at 12/13/21 0811   feeding supplement (KATE FARMS STANDARD 1.4) liquid 325 mL  325 mL Oral BID BM Massengill, Nathan, MD   325 mL at 12/13/21 1403   fluticasone (FLONASE) 50 MCG/ACT nasal spray 1 spray  1 spray Each Nare Daily PRN Harlow Asa, MD   1 spray at 12/13/21 9528   haloperidol (HALDOL) tablet 2 mg  2 mg Oral BID Harlow Asa, MD   2 mg at 12/14/21 0805   hydrocerin (EUCERIN) cream   Topical BID Janine Limbo, MD   Given at 12/14/21 0806   hydrOXYzine (ATARAX) tablet 25 mg  25 mg Oral TID PRN Janine Limbo, MD   25 mg at 12/13/21 2051   LORazepam (ATIVAN) tablet 1 mg  1 mg Oral Q6H PRN Harlow Asa, MD   1 mg at 10/07/21 1546   magnesium hydroxide (MILK OF MAGNESIA) suspension 30 mL  30 mL Oral Daily PRN Massengill, [name], MD   30 mL at 11/14/21 0802   nicotine (NICODERM CQ - dosed in mg/24 hours) patch 14 mg  14 mg Transdermal Daily Nelda Marseille, Amy E, MD   14 mg at 12/14/21 0807   OLANZapine zydis (ZYPREXA) disintegrating tablet 5 mg  5  mg Oral Q8H PRN Harlow Asa, MD   5 mg at 10/14/21 1416   And   ziprasidone (GEODON) injection 20 mg  20 mg Intramuscular PRN Harlow Asa, MD       ondansetron [facility]) tablet 4 mg  4 mg Oral Q8H PRN Harlow Asa, MD   4 mg at 11/23/21 8403   polyethylene glycol (MIRALAX / GLYCOLAX) packet 17 g  17 g Oral Daily PRN Harlow Asa, MD       propranolol (INDERAL) tablet 10 mg  10 mg Oral BID Viann Fish E, MD   10 mg at 12/14/21 7543   Lab Results:  No results found for this or any previous visit (from the past 73 hour(s)).   Blood Alcohol level:  Lab Results  Component Value Date   ETH <10 60/67/7034   Metabolic Disorder Labs: Lab Results  Component Value Date   HGBA1C 5.3 09/08/2021   MPG 105.41 09/08/2021   No results found for: "PROLACTIN" Lab Results  Component Value Date   CHOL 173 09/08/2021   TRIG 110 09/08/2021   HDL 50 09/08/2021   CHOLHDL 3.5 09/08/2021   VLDL 22 09/08/2021   LDLCALC 101 (H) 09/08/2021   LDLCALC 102 (H) 06/21/2020   Physical Findings:  AIMS: Facial and Oral Movements Muscles of Facial Expression: None, normal Lips and Perioral Area: None, normal Jaw: None, normal Tongue: None, normal,Extremity Movements Upper (arms, wrists, hands, fingers): None, normal Lower (legs, knees, ankles, toes): None, normal, Trunk Movements Neck, shoulders, hips: None, normal, Overall Severity Severity of abnormal movements (highest score from questions above): None, normal Incapacitation due to abnormal movements: None, normal Patient's awareness of abnormal movements (rate only patient's report): No Awareness, Dental Status Current problems with teeth and/or dentures?: No Does patient usually wear dentures?: No   Musculoskeletal: Strength & Muscle Tone: normal Gait & Station: normal Patient leans: N/A  Psychiatric Specialty Exam:  Presentation  General Appearance: unkempt appearing  - wearing hair bonnet, wearing same dirty clothes for  last several days - face appears unwashed and mildly malodorous  Eye Contact:Fair - glaring quality at times  Speech: speaks in short phrases to direct questions  Speech Volume:Normal  Mood and Affect  Mood:appears mildly anxious and confused  Affect: calm, polite, confused appearing  Thought Process  Thought Processes: concrete, confused at times  Orientation: Partially oriented to time place and situation  Thought Content: Denies SI HI or AVH, does not present paranoid or responding to stimuli. Hallucinations:Denied  Ideas of Reference:Denied  Suicidal Thoughts:Denied   Homicidal Thoughts:Denied   Sensorium  Memory:Poor  Judgment:Poor  Insight:Poor  Executive Functions  Concentration:Poor  Attention Span:Poor  Recall:Poor  Fund of Knowledge:Limited  Language:Fair  Psychomotor Activity  Psychomotor Activity:no restlessness, akathisia or tremors noted, AIMS 0  Assets  Assets:Resilience  Physical Exam Vitals and nursing note reviewed.  Constitutional:  Appearance: Normal appearance.  HENT:     Head: Normocephalic.     Mouth/Throat:     Comments: Increased drooling on exam Pulmonary:     Effort: Pulmonary effort is normal.  Neurological:     General: No focal deficit present.     Mental Status: [name] alert.    Review of Systems  Constitutional: Negative.   HENT: Negative.    Eyes: Negative.   Respiratory: Negative.  Negative for shortness of breath.   Cardiovascular: Negative.  Negative for chest pain and palpitations.  Gastrointestinal: Negative.  Negative for abdominal pain, constipation, diarrhea, nausea and vomiting.  Genitourinary: Negative.   Musculoskeletal: Negative.   Skin: Negative.   Neurological: Negative.  Negative for dizziness and headaches.   Blood pressure 105/77, pulse (!) 113, temperature 97.9 F (36.6 C), temperature source Oral, resp. rate 20, height 5' 6"  (1.676 m), weight 72.6 kg, SpO2 97 %. Body mass index is  25.83 kg/m.  Treatment Plan Summary:  Diagnoses / Active Problems: Bipolar I MRE manic with psychotic features Major Neurocognitive d/o with behavioral disturbance  PLAN: Safety and Monitoring:  -- Voluntary admission (signed in by guardian for treatment on 11/19/21) to inpatient psychiatric unit for safety, stabilization and treatment  -- Daily contact with patient to assess and evaluate symptoms and progress in treatment  -- Patient's case to be discussed in multi-disciplinary team meeting  -- Observation Level : q15 minute checks  -- Vital signs:  q12 hours  -- Precautions: suicide, elopement, and assault  -- Guardian requests family meeting this week with SW and MD with med print out and plans to discuss updates in person- SW notified of request to coordinate, will follow.  2. Psychiatric Diagnoses and Treatment:   Bipolar I MRE manic with psychotic features  Major neurocognitive disorder with behavioral disturbance (marked for age brain atrophy on CT head and MOCA 11/30) -Continue Aricept 5 mg daily for major neurocognitive disorder with plan to titrate to 10 mg on 9/4   -Continue Haldol 66m bid - (discussed with guardian and will continue present dose with hope of attempting to taper off in future if tolerated) - Continue Cogentin 0.593mbid and monitor for EPS -Continue atropine ophthalmologic gtts 1 drop q8 hour SL PRN drooling (guardian consents to med trial) -Continue Inderal 1080mid for help with anxiety and tachycardia (guardian consents to med trial) -InLorayne Benderstenna 234 mg IM given on 09-30-21, 156m42m given 10/12/20 - Invega 78 mg given on 7/21; Invega 78mg7men 8/18 with consent by guardian -- Zyprexa/Geodon/ativan agitation protocol PRN   --Additional psychosis labs (HIV- , RPR-, ANA-, heavy metal WNL, ESR wnl, ceruloplasmin wnl and CT head shows Marked for age brain atrophy.  No acute or reversible finding.)   --Depakote, Ativan, and Zyprexa stopped -- ACTT referral  pending -- Continue Aricept 5mg d62my for memory issues -- Interim guardianship in place with mother and waiting on SSDI and medicaid for group home referral -- working on prompts to allow staff to assist with ADLs and hygiene but she is resistant  3. Medical Issues Being Addressed:   Tobacco Use Disorder  -- Nicotine patch 14mg/276murs ordered and d/c nicotine gum    Hyperammonemia -resolved --Ammonia level 30 on 10/27/21 and 25 on 8/4 --Depakote was stopped     Elevated creatinine  --Creatinine of 1.42 on 8/16 - on repeat today 1.19 with GFR 54 -- Continue to encourage po fluids and trend     Seasonal allergies --Restart  Flonase PRN per guardian approval  Constipation - improved -- Continue colace 152m qd dosing  -- PRN Miralax for severe constipation  Nausea/Vomiting -resolved -- SLP saw for swallow assessment and feels is cognitively based dysphagia - no limitations continue regular diet and thin liquid -- sitter for meals to help with swallowing/portion control   Tachycardia  -- Rechecking EKG and starting Inderal 180mbid - checking orthostatic vitals  Hypokalemia - resolved - Received K+ 20 meq bid and repeat BMP 8/18 shows K+ 4.0  4. Discharge Planning:   -- Social work and case management to assist with discharge planning and identification of hospital follow-up needs prior to discharge  -- Discharge Concerns: Need to establish a safety plan; Medication compliance and effectiveness; safe housing   -- Discharge Goals: ALF or group home with outpatient referrals for mental health follow-up including medication management/psychotherapy  Labs On Admission- CMP: WNL except Cr: 1.1,  GFR: 60,  CBC: WNL,  UDS: Neg,  Troponin: 3,  EtOH: Neg,  beta hCG: 6.4,  Resp Panel: Neg 5/29- Urine Preg: Neg,  A1c: 5.3,  TSH: 1.719,  Lipid Panel: WNL except LDL: 101 5/24- BMP: WNL except Cr: 1.01,  Ca: 8.4,  hCG: 5,  Urine Preg: Neg 5/28- Ammonia: 32,  CBC: WNL,  CMP: WNL except  Cr:  1.08,  Ca: 8.6,  Depakote lvl: 20 6/3- Depakote lvl: 64, Hep Function: WNL,  CBC: WNL except Hem: 11.9 6/7- Depakote lvl: 75 6/18- UA: Neg,  CBC: WNL,  Depakote lvl: 136,  Ammonia: 99,  BMP: WNL except BUN: 22,  Cr: 1.19,  Ca: 8.6,  GFR: 54,  Hep Function: WNL except Albu: 3.3,  Indirect Bili: 0.2 6/20- ANA: Neg,  Ammonia: 48,  Ceruloplasmin: 24.2,  CMP: WNL except BUN: 23,  Cr: 1.35,  Ca: 8.3,  Albu: 3.4  HIV:Neg,  RPR: Neg,  Sed Rate: 6,  Depakote lvl: 25,  Heavy Metal: Neg 6/23- Ammonia: 46,  BMP: WNL except Cr: 1.2,  Ca: 8.3,  GFR: 54 6/24- UA: WNL except Bacteria: rare 6/30- CMP:  WNL except Cr: 1.04,  Ca: 8.6,  CBC: WNL,  Ammonia: 41, EKG: NSR with Qtc: 399 7/8 - CMP WNL other than CR 1.33 and GFR 48; Ammonia 30 7/18 - Cr of 1.22, eGFR of 53 7/23 - EKG with Qtc of 422 7/31 - creatinine 1.08 8/1 - creatinine 1.24 8/3 - COVID negative 8/4 - CBC WNL, NH3 25, CMP WNL other than creatinine 1.05 and glucose 122, lipase WNL 8/6 - CXR negative and COVID negative 8/16 - Creatinine 1.42 with GFR 44, glucose 146 and K+ 3.4 otherwise BMP WNL 8/18 - Creatinine 1.19 with GFR 54, glucose 110, and CL 112 and K+ 4.0  Ashauna Bertholf, MD 12/14/2021, 10:29 AM

## 2021-12-14 NOTE — [facility] Group Notes (Signed)
  Spirituality group facilitated by Kathleen Argue, BCC.   Group Description: Group focused on topic of hope. Patients participated in facilitated discussion around topic, connecting with one another around experiences and definitions for hope. Group members engaged with visual explorer photos, reflecting on what hope looks like for them today. Group engaged in discussion around how their definitions of hope are present today in hospital.   Modalities: Psycho-social ed, Adlerian, Narrative, MI   Patient Progress: Aleecia was in and out of group.  While present, she participated in the group conversation and showed engagement.  [address] Bcc Pager, [phone]

## 2021-12-14 NOTE — Progress Notes (Signed)
   12/14/21 0515  Sleep  Number of Hours 7.5    

## 2021-12-14 NOTE — Progress Notes (Signed)
   12/14/21 1100  Psych Admission Type (Psych Patients Only)  Admission Status Involuntary  Psychosocial Assessment  Patient Complaints Confusion  Eye Contact Glaring  Facial Expression Anxious  Affect Flat  Speech Soft  Interaction Cautious  Motor Activity Slow  Appearance/Hygiene Improved  Behavior Characteristics Cooperative  Mood Anxious;Suspicious  Aggressive Behavior  Effect No apparent injury  Thought Process  Coherency Disorganized  Content WDL  Delusions None reported or observed  Perception WDL  Hallucination None reported or observed  Judgment Impaired  Confusion Mild  Danger to Self  Current suicidal ideation? Denies  Agreement Not to Harm Self Yes  Description of Agreement Verbal  Danger to Others  Danger to Others None reported or observed  Danger to Others Abnormal  Harmful Behavior to others No threats or harm toward other people  Destructive Behavior No threats or harm toward property

## 2021-12-14 NOTE — Progress Notes (Signed)
    12/14/21 2043  Psych Admission Type (Psych Patients Only)  Admission Status Involuntary  Psychosocial Assessment  Patient Complaints Confusion  Eye Contact Glaring  Facial Expression Blank  Affect Flat  Speech Soft;Slow;Slurred  Interaction Cautious;Minimal  Motor Activity Slow  Appearance/Hygiene Disheveled  Behavior Characteristics Cooperative;Anxious  Mood Anxious;Suspicious  Thought Process  Coherency Disorganized;Concrete thinking  Content WDL  Delusions None reported or observed  Perception WDL  Hallucination None reported or observed  Judgment Poor  Confusion Mild  Danger to Self  Current suicidal ideation? Denies  Danger to Others  Danger to Others None reported or observed

## 2021-12-14 NOTE — Group Note (Signed)
Recreation Therapy Group Note   Group Topic:Goal Setting  Group Date: 12/14/2021 Start Time: 1000 End Time: 1045 Facilitators: Caroll Rancher, LRT,CTRS Location: [address] Dayroom   Goal Area(s) Addresses:  Patient will listen on 1 prompt. Patient will participate in discussion of what a goal is. Patient will successfully complete their self inventory sheet.  Group Description: LRT and patients discussed the importance of goals and how they impact one's life.  Patients were then given a worksheet where they were to identify goals they wanted to accomplish in the next week, month, year and five years.  Patients would then identify obstacles reaching their goals, what they need to achieve goals and what they can start doing now to work towards those goals.   Affect/Mood: Flat   Participation Level: Minimal   Participation Quality: Moderate Cues   Behavior: Reluctant   Speech/Thought Process: Confused   Insight: Impaired   Judgement: Impaired   Modes of Intervention: Worksheet   Patient Response to Interventions:  Challenging    Education Outcome:  Acknowledges education   Clinical Observations/Individualized Feedback: Pt was confused and needed the instructions repeated numerous times to complete the assignment.  Pt still didn't understand what she was supposed to do.  Pt was completely focused on getting discharged from the hospital.    Plan: Continue to engage patient in RT group sessions 2-3x/week.   Caroll Rancher, LRT,CTRS 12/14/2021 1:32 PM

## 2021-12-14 NOTE — [facility] Group Notes (Signed)
Adult Psychoeducational Group Note  Date:  12/14/2021 Time:  8:44 PM  Group Topic/Focus:  Wrap-Up Group:   The focus of this group is to help patients review their daily goal of treatment and discuss progress on daily workbooks.  Participation Level:  Minimal  Participation Quality:  Inattentive  Affect:  Appropriate  Cognitive:  Disorganized, Confused, and Delusional  Insight: None  Engagement in Group:  Off Topic  Modes of Intervention:  Discussion, Education, and Reality Testing  Additional Comments:  Pt attended and participated in wrap up group this evening but did not rate their day. Pt stated that the Dr came to visit them and did not "give good news". Pt then began to go off topic after stating that their sister visited them this evening. Pt stated that they had 80+ siblings and 100's of nieces and nephews. Writer listened then ended discussion with patient.   Chrisandra Netters 12/14/2021, 8:44 PM

## 2021-12-14 NOTE — Progress Notes (Signed)
Pt was encouraged but didn't attend orientation/goals group. ?

## 2021-12-15 DIAGNOSIS — F312 Bipolar disorder, current episode manic severe with psychotic features: Secondary | ICD-10-CM | POA: Diagnosis not present

## 2021-12-15 NOTE — Group Note (Signed)
[facility] LCSW Group Therapy Note   Type of Therapy and Topic:  Group Therapy:  Healthy vs Unhealthy Coping Skills  Participation Level:  Did Not Attend   Description of Group:  The focus of this group was to determine what unhealthy and healthy coping techniques typically are used by group members and why they tend to fall back on those particular techniques of handling hard situations. Patients were guided in becoming aware of the differences between healthy and unhealthy coping techniques.  Facilitator led a discussion about the benefits and costs of returning to old unhealthy coping techniques, as well as the benefits and costs of learning new healthier coping skills.  Therapeutic Goals Patients learned that coping is what human beings do all day long to deal with various situations in their lives Patients defined and discussed healthy vs unhealthy coping techniques Patients came to understand the the reasons human beings often return to old coping techniques that they know are unhelpful Patients were provided with motivation to consider new means of coping that may be more healthy and helpful Patients provided support and ideas to each other  Summary of Patient Progress: Patient was invited to group, did not attend.    Therapeutic Modalities Psychoeducation Motivational Interviewing   Ambrose Mantle, LCSW 12/15/2021, 11:43 AM

## 2021-12-15 NOTE — Progress Notes (Signed)
Adult Psychoeducational Group Note  Date:  12/15/2021 Time:  9:16 PM  Group Topic/Focus:  Wrap-Up Group:   The focus of this group is to help patients review their daily goal of treatment and discuss progress on daily workbooks.  Participation Level:  Active  Participation Quality:  Inattentive  Affect:  Not Congruent  Cognitive:  Disorganized  Insight: Lacking  Engagement in Group:  Lacking, Limited, and Off Topic  Modes of Intervention:  Discussion  Additional Comments:   Pt states that she had a good day. Pt often became confused by questions posed by writer so there wasn't much information retrieved from pt during this group. Pt states she was happy her mom came to visit today. Pt denied everything.  Vevelyn Pat 12/15/2021, 9:16 PM

## 2021-12-15 NOTE — Progress Notes (Signed)
   12/15/21 2100  Psych Admission Type (Psych Patients Only)  Admission Status Involuntary  Psychosocial Assessment  Patient Complaints Confusion  Eye Contact Staring  Facial Expression Flat  Affect Blunted;Appropriate to circumstance  Speech Soft  Interaction Assertive;Guarded  Motor Activity Slow  Appearance/Hygiene Disheveled  Behavior Characteristics Appropriate to situation  Mood Pleasant  Thought Process  Coherency Disorganized  Content WDL  Delusions None reported or observed  Perception WDL  Hallucination None reported or observed  Judgment Impaired  Confusion Mild  Danger to Self  Current suicidal ideation? Denies  Danger to Others  Danger to Others None reported or observed

## 2021-12-15 NOTE — Progress Notes (Signed)
   12/15/21 1400  Psych Admission Type (Psych Patients Only)  Admission Status Involuntary  Psychosocial Assessment  Patient Complaints Confusion  Eye Contact Glaring  Facial Expression Blank  Affect Flat  Speech Soft  Interaction Cautious  Motor Activity Slow  Appearance/Hygiene Disheveled  Behavior Characteristics Cooperative  Mood Anxious;Suspicious  Aggressive Behavior  Effect No apparent injury  Thought Process  Coherency Disorganized;Concrete thinking  Content WDL  Delusions None reported or observed  Perception WDL  Hallucination None reported or observed  Judgment Impaired  Confusion Mild  Danger to Self  Current suicidal ideation? Denies  Danger to Others  Danger to Others None reported or observed  Danger to Others Abnormal  Harmful Behavior to others No threats or harm toward other people  Destructive Behavior No threats or harm toward property

## 2021-12-15 NOTE — [facility] Group Notes (Signed)
Goals Group 12/15/2021   Group Focus: affirmation, clarity of thought, and goals/reality orientation Treatment Modality:  Psychoeducation Interventions utilized were assignment, group exercise, and support Purpose: To be able to understand and verbalize the reason for their admission to the hospital. To understand that the medication helps with their chemical imbalance but they also need to work on their choices in life. To be challenged to develop a list of 30 positives about themselves. Also introduce the concept that "feelings" are not reality.  Participation Level:  Active  Participation Quality:  Appropriate  Affect:  Appropriate  Cognitive:  Appropriate  Insight:  Improving  Engagement in Group:  Engaged  Additional Comments:  Pt rates her energy at a 6/10. Pt continues to voice that she wants to be discharged. Pt's affect was brighter today. But continues to be disorganized  Dione Housekeeper

## 2021-12-15 NOTE — Progress Notes (Signed)
[facility] MD Progress Note  12/15/2021 11:07 AM Natalie Brown  MRN:  [id number]  Reason for Admission:  Natalie Brown is a 56 y.o. female  who was initially admitted for inpatient psychiatric hospitalization on 09/06/2021 for management of mania, disorganized behaviors/thinking, and delusions. The patient is currently on Hospital Day 100.   Chart Review from last 24 hours:  The patient's chart was reviewed and nursing notes were reviewed. The patient's case was discussed in multidisciplinary team meeting.  Patient is compliant with her medications, as needed Atarax for anxiety was given once yesterday, also using Flonase as needed.  Information Obtained Today During Patient Interview: The patient was interviewed in her room, prior to interviewing her patient was taking medicine in front of medication window appropriately with some drooling noted, answering questions in a linear manner but occasionally seems confused at baseline.  She denies any physical complaints, denies feeling depressed or anxious, presents calm, denies SI HI or AVH, reports good sleep and appetite. Reportedly still drooling mainly secondary to keeping food in her mouth, using atropine drops prn, used some this morning.  Today she continues to ask me regarding her discharge planning I did discuss with her again discharge planning and current process awaiting getting approved for benefits to be able to refer her for group homes.  Principal Problem: Bipolar affective disorder, current episode manic with psychotic symptoms ([place]) Diagnosis: Principal Problem:   Bipolar affective disorder, current episode manic with psychotic symptoms (Natalie Brown) Active Problems:   Major neurocognitive disorder ([place])  Past Psychiatric History: see H&P  Past Medical History:  Past Medical History:  Diagnosis Date   Bipolar 1 disorder with moderate mania ([place]) 12/30/2013   Hypertension    Family History: see H&P  Family Psychiatric  History: see  H&P  Social History:  Homeless; mother is interim guardian   Sleep: Good - 6.5 hours  Appetite:  Good  Current Medications: Current Facility-Administered Medications  Medication Dose Route Frequency Provider Last Rate Last Admin   acetaminophen (TYLENOL) tablet 650 mg  650 mg Oral Q6H PRN Briant Cedar, MD   650 mg at 11/14/21 0759   alum & mag hydroxide-simeth (MAALOX/MYLANTA) 200-200-20 MG/5ML suspension 30 mL  30 mL Oral Q4H PRN Massengill, [name], MD       atropine 1 % ophthalmic solution 1 drop  1 drop Sublingual Q8H PRN Winfred Leeds, , MD   1 drop at 12/13/21 0814   benztropine (COGENTIN) tablet 0.5 mg  0.5 mg Oral BID Nelda Marseille, Amy E, MD   0.5 mg at 12/15/21 0801   docusate sodium (COLACE) capsule 100 mg  100 mg Oral Daily Nelda Marseille, Amy E, MD   100 mg at 12/15/21 0801   donepezil (ARICEPT) tablet 5 mg  5 mg Oral Daily Nelda Marseille, Amy E, MD   5 mg at 12/15/21 0811   feeding supplement (KATE FARMS STANDARD 1.4) liquid 325 mL  325 mL Oral BID BM Massengill, Nathan, MD   325 mL at 12/15/21 1057   fluticasone (FLONASE) 50 MCG/ACT nasal spray 1 spray  1 spray Each Nare Daily PRN Harlow Asa, MD   1 spray at 12/13/21 1601   haloperidol (HALDOL) tablet 2 mg  2 mg Oral BID Harlow Asa, MD   2 mg at 12/15/21 0801   hydrocerin (EUCERIN) cream   Topical BID Janine Limbo, MD   Given at 12/15/21 0803   hydrOXYzine (ATARAX) tablet 25 mg  25 mg Oral TID PRN Janine Limbo, MD  25 mg at 12/14/21 2043   LORazepam (ATIVAN) tablet 1 mg  1 mg Oral Q6H PRN Harlow Asa, MD   1 mg at 10/07/21 1546   magnesium hydroxide (MILK OF MAGNESIA) suspension 30 mL  30 mL Oral Daily PRN Massengill, [name], MD   30 mL at 11/14/21 0802   nicotine (NICODERM CQ - dosed in mg/24 hours) patch 14 mg  14 mg Transdermal Daily Nelda Marseille, Amy E, MD   14 mg at 12/15/21 0802   OLANZapine zydis (ZYPREXA) disintegrating tablet 5 mg  5 mg Oral Q8H PRN Harlow Asa, MD   5 mg at 10/14/21 1416    And   ziprasidone (GEODON) injection 20 mg  20 mg Intramuscular PRN Harlow Asa, MD       ondansetron [facility]) tablet 4 mg  4 mg Oral Q8H PRN Harlow Asa, MD   4 mg at 11/23/21 2831   polyethylene glycol (MIRALAX / GLYCOLAX) packet 17 g  17 g Oral Daily PRN Harlow Asa, MD       propranolol (INDERAL) tablet 10 mg  10 mg Oral BID Viann Fish E, MD   10 mg at 12/15/21 5176   Lab Results:  No results found for this or any previous visit (from the past 30 hour(s)).   Blood Alcohol level:  Lab Results  Component Value Date   ETH <10 16/10/3708   Metabolic Disorder Labs: Lab Results  Component Value Date   HGBA1C 5.3 09/08/2021   MPG 105.41 09/08/2021   No results found for: "PROLACTIN" Lab Results  Component Value Date   CHOL 173 09/08/2021   TRIG 110 09/08/2021   HDL 50 09/08/2021   CHOLHDL 3.5 09/08/2021   VLDL 22 09/08/2021   LDLCALC 101 (H) 09/08/2021   LDLCALC 102 (H) 06/21/2020   Physical Findings:  AIMS: Facial and Oral Movements Muscles of Facial Expression: None, normal Lips and Perioral Area: None, normal Jaw: None, normal Tongue: None, normal,Extremity Movements Upper (arms, wrists, hands, fingers): None, normal Lower (legs, knees, ankles, toes): None, normal, Trunk Movements Neck, shoulders, hips: None, normal, Overall Severity Severity of abnormal movements (highest score from questions above): None, normal Incapacitation due to abnormal movements: None, normal Patient's awareness of abnormal movements (rate only patient's report): No Awareness, Dental Status Current problems with teeth and/or dentures?: No Does patient usually wear dentures?: No   Musculoskeletal: Strength & Muscle Tone: normal Gait & Station: normal Patient leans: N/A  Psychiatric Specialty Exam:  Presentation  General Appearance: unkempt appearing  - wearing hair bonnet, wearing same dirty clothes for last several days - face appears unwashed and mildly  malodorous  Eye Contact:Fair - glaring quality at times  Speech: speaks in short phrases to direct questions  Speech Volume:Normal  Mood and Affect  Mood:appears mildly anxious and confused  Affect: calm, polite, confused appearing  Thought Process  Thought Processes: concrete, confused at times  Orientation: Partially oriented to time place and situation  Thought Content: Denies SI HI or AVH, does not present paranoid or responding to stimuli. Hallucinations:Denied  Ideas of Reference:Denied  Suicidal Thoughts:Denied   Homicidal Thoughts:Denied   Sensorium  Memory:Poor  Judgment:Poor  Insight:Poor  Executive Functions  Concentration:Poor  Attention Span:Poor  Recall:Poor  Fund of Knowledge:Limited  Language:Fair  Psychomotor Activity  Psychomotor Activity:no restlessness, akathisia or tremors noted, AIMS 0  Assets  Assets:Resilience  Physical Exam Vitals and nursing note reviewed.  Constitutional:      Appearance: Normal appearance.  HENT:  Head: Normocephalic.     Mouth/Throat:     Comments: Increased drooling on exam Pulmonary:     Effort: Pulmonary effort is normal.  Neurological:     General: No focal deficit present.     Mental Status: [name] alert.    Review of Systems  Constitutional: Negative.   HENT: Negative.    Eyes: Negative.   Respiratory: Negative.  Negative for shortness of breath.   Cardiovascular: Negative.  Negative for chest pain and palpitations.  Gastrointestinal: Negative.  Negative for abdominal pain, constipation, diarrhea, nausea and vomiting.  Genitourinary: Negative.   Musculoskeletal: Negative.   Skin: Negative.   Neurological: Negative.  Negative for dizziness and headaches.   Blood pressure 119/79, pulse (!) 109, temperature 97.7 F (36.5 C), temperature source Oral, resp. rate 16, height 5' 6" (1.676 m), weight 72.6 kg, SpO2 100 %. Body mass index is 25.83 kg/m.  Treatment Plan Summary:  Diagnoses  / Active Problems: Bipolar I MRE manic with psychotic features Major Neurocognitive d/o with behavioral disturbance  PLAN: Safety and Monitoring:  -- Voluntary admission (signed in by guardian for treatment on 11/19/21) to inpatient psychiatric unit for safety, stabilization and treatment  -- Daily contact with patient to assess and evaluate symptoms and progress in treatment  -- Patient's case to be discussed in multi-disciplinary team meeting  -- Observation Level : q15 minute checks  -- Vital signs:  q12 hours  -- Precautions: suicide, elopement, and assault  -- Guardian requests family meeting this week with SW and MD with med print out and plans to discuss updates in person- SW notified of request to coordinate, will follow.  2. Psychiatric Diagnoses and Treatment:   Bipolar I MRE manic with psychotic features  Major neurocognitive disorder with behavioral disturbance (marked for age brain atrophy on CT head and MOCA 11/30) -Continue Aricept 5 mg daily for major neurocognitive disorder with plan to titrate to 10 mg on 9/4   -Continue Haldol 43m bid - (discussed with guardian and will continue present dose with hope of attempting to taper off in future if tolerated) - Continue Cogentin 0.528mbid and monitor for EPS -Continue atropine ophthalmologic gtts 1 drop q8 hour SL PRN drooling (guardian consents to med trial) -Continue Inderal 1038mid for help with anxiety and tachycardia (guardian consents to med trial) -InLorayne Benderstenna 234 mg IM given on 09-30-21, 156m69m given 10/12/20 - Invega 78 mg given on 7/21; Invega 78mg108men 8/18 with consent by guardian -- Zyprexa/Geodon/ativan agitation protocol PRN   --Additional psychosis labs (HIV- , RPR-, ANA-, heavy metal WNL, ESR wnl, ceruloplasmin wnl and CT head shows Marked for age brain atrophy.  No acute or reversible finding.)   --Depakote, Ativan, and Zyprexa stopped -- ACTT referral pending -- Continue Aricept 5mg d73my for memory  issues -- Interim guardianship in place with mother and waiting on SSDI and medicaid for group home referral -- working on prompts to allow staff to assist with ADLs and hygiene but she is resistant  3. Medical Issues Being Addressed:   Tobacco Use Disorder  -- Nicotine patch 14mg/215murs ordered and d/c nicotine gum    Hyperammonemia -resolved --Ammonia level 30 on 10/27/21 and 25 on 8/4 --Depakote was stopped     Elevated creatinine  --Creatinine of 1.42 on 8/16 - on repeat today 1.19 with GFR 54 -- Continue to encourage po fluids and trend     Seasonal allergies --Restart Flonase PRN per guardian approval  Constipation - improved --  Continue colace 19m qd dosing  -- PRN Miralax for severe constipation  Nausea/Vomiting -resolved -- SLP saw for swallow assessment and feels is cognitively based dysphagia - no limitations continue regular diet and thin liquid -- sitter for meals to help with swallowing/portion control   Tachycardia  -- Rechecking EKG and starting Inderal 175mbid - checking orthostatic vitals  Hypokalemia - resolved - Received K+ 20 meq bid and repeat BMP 8/18 shows K+ 4.0  4. Discharge Planning:   -- Social work and case management to assist with discharge planning and identification of hospital follow-up needs prior to discharge  -- Discharge Concerns: Need to establish a safety plan; Medication compliance and effectiveness; safe housing   -- Discharge Goals: ALF or group home with outpatient referrals for mental health follow-up including medication management/psychotherapy  Labs On Admission- CMP: WNL except Cr: 1.1,  GFR: 60,  CBC: WNL,  UDS: Neg,  Troponin: 3,  EtOH: Neg,  beta hCG: 6.4,  Resp Panel: Neg 5/29- Urine Preg: Neg,  A1c: 5.3,  TSH: 1.719,  Lipid Panel: WNL except LDL: 101 5/24- BMP: WNL except Cr: 1.01,  Ca: 8.4,  hCG: 5,  Urine Preg: Neg 5/28- Ammonia: 32,  CBC: WNL,  CMP: WNL except  Cr: 1.08,  Ca: 8.6,  Depakote lvl: 20 6/3- Depakote  lvl: 64, Hep Function: WNL,  CBC: WNL except Hem: 11.9 6/7- Depakote lvl: 75 6/18- UA: Neg,  CBC: WNL,  Depakote lvl: 136,  Ammonia: 99,  BMP: WNL except BUN: 22,  Cr: 1.19,  Ca: 8.6,  GFR: 54,  Hep Function: WNL except Albu: 3.3,  Indirect Bili: 0.2 6/20- ANA: Neg,  Ammonia: 48,  Ceruloplasmin: 24.2,  CMP: WNL except BUN: 23,  Cr: 1.35,  Ca: 8.3,  Albu: 3.4  HIV:Neg,  RPR: Neg,  Sed Rate: 6,  Depakote lvl: 25,  Heavy Metal: Neg 6/23- Ammonia: 46,  BMP: WNL except Cr: 1.2,  Ca: 8.3,  GFR: 54 6/24- UA: WNL except Bacteria: rare 6/30- CMP:  WNL except Cr: 1.04,  Ca: 8.6,  CBC: WNL,  Ammonia: 41, EKG: NSR with Qtc: 399 7/8 - CMP WNL other than CR 1.33 and GFR 48; Ammonia 30 7/18 - Cr of 1.22, eGFR of 53 7/23 - EKG with Qtc of 422 7/31 - creatinine 1.08 8/1 - creatinine 1.24 8/3 - COVID negative 8/4 - CBC WNL, NH3 25, CMP WNL other than creatinine 1.05 and glucose 122, lipase WNL 8/6 - CXR negative and COVID negative 8/16 - Creatinine 1.42 with GFR 44, glucose 146 and K+ 3.4 otherwise BMP WNL 8/18 - Creatinine 1.19 with GFR 54, glucose 110, and CL 112 and K+ 4.0   , MD 12/15/2021, 11:07 AM

## 2021-12-16 DIAGNOSIS — F312 Bipolar disorder, current episode manic severe with psychotic features: Secondary | ICD-10-CM | POA: Diagnosis not present

## 2021-12-16 NOTE — Progress Notes (Signed)
Adult Psychoeducational Group Note  Date:  12/16/2021 Time:  9:17 PM  Group Topic/Focus:  Wrap-Up Group:   The focus of this group is to help patients review their daily goal of treatment and discuss progress on daily workbooks.  Participation Level:  None  Participation Quality:  Drowsy  Affect:   None  Cognitive:   None  Insight: None  Engagement in Group:   None  Modes of Intervention:   None  Additional Comments:  Pt attend group by choose not to participate in group.  Natalie Brown 12/16/2021, 9:17 PM

## 2021-12-16 NOTE — [facility] Group Notes (Signed)
Adult Psychoeducational Group Note Date:  12/09/2021 Time:  0900-1045 Group Topic/Focus: PROGRESSIVE RELAXATION. A group where deep breathing is taught and tensing and relaxation muscle groups is used. Imagery is used as well.  Pts are asked to imagine 3 pillars that hold them up when they are not able to hold themselves up and to share that with the group.  Participation Level:  did not attend  Annahi Short A   

## 2021-12-16 NOTE — Progress Notes (Signed)
[facility] MD Progress Note  12/16/2021 11:54 AM [place]  MRN:  [id number]  Reason for Admission:  Natalie Brown is a 56 y.o. female  who was initially admitted for inpatient psychiatric hospitalization on 09/06/2021 for management of mania, disorganized behaviors/thinking, and delusions. The patient is currently on Hospital Day 101.   Chart Review from last 24 hours:  The patient's chart was reviewed and nursing notes were reviewed. The patient's case was discussed in multidisciplinary team meeting.  Patient is compliant with her medications, as needed Atarax for anxiety was given once yesterday, also using Flonase as needed.  Information Obtained Today During Patient Interview: The patient was interviewed in her room, prior to interviewing her patient was taking medicine in front of medication window appropriately with some drooling noted, answering questions in a linear manner but occasionally seems confused at baseline.  She denies any physical complaints, denies feeling depressed or anxious, presents calm, denies SI HI or AVH, reports good sleep and appetite.  Staff report improving drooling, using atropine drops as needed.  Principal Problem: Bipolar affective disorder, current episode manic with psychotic symptoms ([place]) Diagnosis: Principal Problem:   Bipolar affective disorder, current episode manic with psychotic symptoms ([place]) Active Problems:   Major neurocognitive disorder ([place])  Past Psychiatric History: see H&P  Past Medical History:  Past Medical History:  Diagnosis Date   Bipolar 1 disorder with moderate mania ([place]) 12/30/2013   Hypertension    Family History: see H&P  Family Psychiatric  History: see H&P  Social History:  Homeless; mother is interim guardian   Sleep: Good - 6.5 hours  Appetite:  Good  Current Medications: Current Facility-Administered Medications  Medication Dose Route Frequency Provider Last Rate Last Admin   acetaminophen (TYLENOL)  tablet 650 mg  650 mg Oral Q6H PRN Briant Cedar, MD   650 mg at 11/14/21 0759   alum & mag hydroxide-simeth (MAALOX/MYLANTA) 200-200-20 MG/5ML suspension 30 mL  30 mL Oral Q4H PRN Massengill, [name], MD       atropine 1 % ophthalmic solution 1 drop  1 drop Sublingual Q8H PRN Lowell Mcgurk, MD   1 drop at 12/16/21 0830   benztropine (COGENTIN) tablet 0.5 mg  0.5 mg Oral BID Nelda Marseille, Amy E, MD   0.5 mg at 12/16/21 3536   docusate sodium (COLACE) capsule 100 mg  100 mg Oral Daily Nelda Marseille, Amy E, MD   100 mg at 12/16/21 0821   donepezil (ARICEPT) tablet 5 mg  5 mg Oral Daily Nelda Marseille, Amy E, MD   5 mg at 12/16/21 1443   feeding supplement (KATE FARMS STANDARD 1.4) liquid 325 mL  325 mL Oral BID BM Massengill, Nathan, MD   325 mL at 12/16/21 0955   fluticasone (FLONASE) 50 MCG/ACT nasal spray 1 spray  1 spray Each Nare Daily PRN Harlow Asa, MD   1 spray at 12/13/21 1540   haloperidol (HALDOL) tablet 2 mg  2 mg Oral BID Harlow Asa, MD   2 mg at 12/16/21 0867   hydrocerin (EUCERIN) cream   Topical BID Janine Limbo, MD   Given at 12/15/21 1634   hydrOXYzine (ATARAX) tablet 25 mg  25 mg Oral TID PRN Janine Limbo, MD   25 mg at 12/14/21 2043   LORazepam (ATIVAN) tablet 1 mg  1 mg Oral Q6H PRN Harlow Asa, MD   1 mg at 10/07/21 1546   magnesium hydroxide (MILK OF MAGNESIA) suspension 30 mL  30 mL Oral Daily  PRN Janine Limbo, MD   30 mL at 11/14/21 0802   nicotine (NICODERM CQ - dosed in mg/24 hours) patch 14 mg  14 mg Transdermal Daily Nelda Marseille, Amy E, MD   14 mg at 12/16/21 0822   OLANZapine zydis (ZYPREXA) disintegrating tablet 5 mg  5 mg Oral Q8H PRN Harlow Asa, MD   5 mg at 10/14/21 1416   And   ziprasidone (GEODON) injection 20 mg  20 mg Intramuscular PRN Harlow Asa, MD       ondansetron [facility]) tablet 4 mg  4 mg Oral Q8H PRN Harlow Asa, MD   4 mg at 11/23/21 9794   polyethylene glycol (MIRALAX / GLYCOLAX) packet 17 g  17 g Oral Daily  PRN Harlow Asa, MD       propranolol (INDERAL) tablet 10 mg  10 mg Oral BID Harlow Asa, MD   10 mg at 12/16/21 8016   Lab Results:  No results found for this or any previous visit (from the past 31 hour(s)).   Blood Alcohol level:  Lab Results  Component Value Date   ETH <10 55/37/4827   Metabolic Disorder Labs: Lab Results  Component Value Date   HGBA1C 5.3 09/08/2021   MPG 105.41 09/08/2021   No results found for: "PROLACTIN" Lab Results  Component Value Date   CHOL 173 09/08/2021   TRIG 110 09/08/2021   HDL 50 09/08/2021   CHOLHDL 3.5 09/08/2021   VLDL 22 09/08/2021   LDLCALC 101 (H) 09/08/2021   LDLCALC 102 (H) 06/21/2020   Physical Findings:  AIMS: Facial and Oral Movements Muscles of Facial Expression: None, normal Lips and Perioral Area: None, normal Jaw: None, normal Tongue: None, normal,Extremity Movements Upper (arms, wrists, hands, fingers): None, normal Lower (legs, knees, ankles, toes): None, normal, Trunk Movements Neck, shoulders, hips: None, normal, Overall Severity Severity of abnormal movements (highest score from questions above): None, normal Incapacitation due to abnormal movements: None, normal Patient's awareness of abnormal movements (rate only patient's report): No Awareness, Dental Status Current problems with teeth and/or dentures?: No Does patient usually wear dentures?: No   Musculoskeletal: Strength & Muscle Tone: normal Gait & Station: normal Patient leans: N/A  Psychiatric Specialty Exam:  Presentation  General Appearance: unkempt appearing  - wearing hair bonnet, wearing same dirty clothes for last several days - face appears unwashed and mildly malodorous  Eye Contact:Fair - glaring quality at times  Speech: speaks in short phrases to direct questions  Speech Volume:Normal  Mood and Affect  Mood:appears mildly anxious and confused  Affect: calm, polite, confused appearing  Thought Process  Thought  Processes: concrete, confused at times  Orientation: Partially oriented to time place and situation  Thought Content: Denies SI HI or AVH, does not present paranoid or responding to stimuli. Hallucinations:Denied  Ideas of Reference:Denied  Suicidal Thoughts:Denied   Homicidal Thoughts:Denied   Sensorium  Memory:Poor  Judgment:Poor  Insight:Poor  Executive Functions  Concentration:Poor  Attention Span:Poor  Recall:Poor  Fund of Knowledge:Limited  Language:Fair  Psychomotor Activity  Psychomotor Activity:no restlessness, akathisia or tremors noted, AIMS 0  Assets  Assets:Resilience  Physical Exam Vitals and nursing note reviewed.  Constitutional:      Appearance: Normal appearance.  HENT:     Head: Normocephalic.     Mouth/Throat:     Comments: Increased drooling on exam Pulmonary:     Effort: Pulmonary effort is normal.  Neurological:     General: No focal deficit present.  Mental Status: [name] alert.    Review of Systems  Constitutional: Negative.   HENT: Negative.    Eyes: Negative.   Respiratory: Negative.  Negative for shortness of breath.   Cardiovascular: Negative.  Negative for chest pain and palpitations.  Gastrointestinal: Negative.  Negative for abdominal pain, constipation, diarrhea, nausea and vomiting.  Genitourinary: Negative.   Musculoskeletal: Negative.   Skin: Negative.   Neurological: Negative.  Negative for dizziness and headaches.   Blood pressure (!) 128/90, pulse (!) 102, temperature 97.9 F (36.6 C), temperature source Oral, resp. rate 16, height 5' 6"  (1.676 m), weight 72.6 kg, SpO2 100 %. Body mass index is 25.83 kg/m.  Treatment Plan Summary:  Diagnoses / Active Problems: Bipolar I MRE manic with psychotic features Major Neurocognitive d/o with behavioral disturbance  PLAN: Safety and Monitoring:  -- Voluntary admission (signed in by guardian for treatment on 11/19/21) to inpatient psychiatric unit for safety,  stabilization and treatment  -- Daily contact with patient to assess and evaluate symptoms and progress in treatment  -- Patient's case to be discussed in multi-disciplinary team meeting  -- Observation Level : q15 minute checks  -- Vital signs:  q12 hours  -- Precautions: suicide, elopement, and assault  -- Guardian requests family meeting this week with SW and MD with med print out and plans to discuss updates in person- SW notified of request to coordinate, will follow.  2. Psychiatric Diagnoses and Treatment:   Bipolar I MRE manic with psychotic features  Major neurocognitive disorder with behavioral disturbance (marked for age brain atrophy on CT head and MOCA 11/30) -Continue Aricept 5 mg daily for major neurocognitive disorder with plan to titrate to 10 mg on 9/4   -Continue Haldol 5m bid - (discussed with guardian and will continue present dose with hope of attempting to taper off in future if tolerated) - Continue Cogentin 0.559mbid and monitor for EPS -Continue atropine ophthalmologic gtts 1 drop q8 hour SL PRN drooling (guardian consents to med trial) -Continue Inderal 1088mid for help with anxiety and tachycardia (guardian consents to med trial) -InLorayne Benderstenna 234 mg IM given on 09-30-21, 156m75m given 10/12/20 - Invega 78 mg given on 7/21; Invega 78mg47men 8/18 with consent by guardian -- Zyprexa/Geodon/ativan agitation protocol PRN   --Additional psychosis labs (HIV- , RPR-, ANA-, heavy metal WNL, ESR wnl, ceruloplasmin wnl and CT head shows Marked for age brain atrophy.  No acute or reversible finding.)   --Depakote, Ativan, and Zyprexa stopped -- ACTT referral pending -- Continue Aricept 5mg d51my for memory issues -- Interim guardianship in place with mother and waiting on SSDI and medicaid for group home referral -- working on prompts to allow staff to assist with ADLs and hygiene but she is resistant  3. Medical Issues Being Addressed:   Tobacco Use Disorder  --  Nicotine patch 14mg/254murs ordered and d/c nicotine gum    Hyperammonemia -resolved --Ammonia level 30 on 10/27/21 and 25 on 8/4 --Depakote was stopped     Elevated creatinine  --Creatinine of 1.42 on 8/16 - on repeat today 1.19 with GFR 54 -- Continue to encourage po fluids and trend     Seasonal allergies --Restart Flonase PRN per guardian approval  Constipation - improved -- Continue colace 100mg qd47ming  -- PRN Miralax for severe constipation  Nausea/Vomiting -resolved -- SLP saw for swallow assessment and feels is cognitively based dysphagia - no limitations continue regular diet and thin liquid -- sitter for  meals to help with swallowing/portion control   Tachycardia  -- Rechecking EKG and starting Inderal 29m bid - checking orthostatic vitals  Hypokalemia - resolved - Received K+ 20 meq bid and repeat BMP 8/18 shows K+ 4.0  4. Discharge Planning:   -- Social work and case management to assist with discharge planning and identification of hospital follow-up needs prior to discharge  -- Discharge Concerns: Need to establish a safety plan; Medication compliance and effectiveness; safe housing   -- Discharge Goals: ALF or group home with outpatient referrals for mental health follow-up including medication management/psychotherapy  Labs On Admission- CMP: WNL except Cr: 1.1,  GFR: 60,  CBC: WNL,  UDS: Neg,  Troponin: 3,  EtOH: Neg,  beta hCG: 6.4,  Resp Panel: Neg 5/29- Urine Preg: Neg,  A1c: 5.3,  TSH: 1.719,  Lipid Panel: WNL except LDL: 101 5/24- BMP: WNL except Cr: 1.01,  Ca: 8.4,  hCG: 5,  Urine Preg: Neg 5/28- Ammonia: 32,  CBC: WNL,  CMP: WNL except  Cr: 1.08,  Ca: 8.6,  Depakote lvl: 20 6/3- Depakote lvl: 64, Hep Function: WNL,  CBC: WNL except Hem: 11.9 6/7- Depakote lvl: 75 6/18- UA: Neg,  CBC: WNL,  Depakote lvl: 136,  Ammonia: 99,  BMP: WNL except BUN: 22,  Cr: 1.19,  Ca: 8.6,  GFR: 54,  Hep Function: WNL except Albu: 3.3,  Indirect Bili: 0.2 6/20- ANA: Neg,   Ammonia: 48,  Ceruloplasmin: 24.2,  CMP: WNL except BUN: 23,  Cr: 1.35,  Ca: 8.3,  Albu: 3.4  HIV:Neg,  RPR: Neg,  Sed Rate: 6,  Depakote lvl: 25,  Heavy Metal: Neg 6/23- Ammonia: 46,  BMP: WNL except Cr: 1.2,  Ca: 8.3,  GFR: 54 6/24- UA: WNL except Bacteria: rare 6/30- CMP:  WNL except Cr: 1.04,  Ca: 8.6,  CBC: WNL,  Ammonia: 41, EKG: NSR with Qtc: 399 7/8 - CMP WNL other than CR 1.33 and GFR 48; Ammonia 30 7/18 - Cr of 1.22, eGFR of 53 7/23 - EKG with Qtc of 422 7/31 - creatinine 1.08 8/1 - creatinine 1.24 8/3 - COVID negative 8/4 - CBC WNL, NH3 25, CMP WNL other than creatinine 1.05 and glucose 122, lipase WNL 8/6 - CXR negative and COVID negative 8/16 - Creatinine 1.42 with GFR 44, glucose 146 and K+ 3.4 otherwise BMP WNL 8/18 - Creatinine 1.19 with GFR 54, glucose 110, and CL 112 and K+ 4.0  Briunna Leicht, MD 12/16/2021, 11:54 AM

## 2021-12-16 NOTE — Progress Notes (Signed)
   12/16/21 2143  Psych Admission Type (Psych Patients Only)  Admission Status Involuntary  Psychosocial Assessment  Patient Complaints None  Eye Contact Staring  Facial Expression Flat  Affect Blunted;Appropriate to circumstance  Speech Soft  Interaction Assertive;Guarded  Motor Activity Slow  Appearance/Hygiene Disheveled  Behavior Characteristics Appropriate to situation  Mood Pleasant  Thought Process  Coherency Disorganized  Content WDL  Delusions None reported or observed  Perception WDL  Hallucination None reported or observed  Judgment Impaired  Confusion Mild  Danger to Self  Current suicidal ideation? Denies  Danger to Others  Danger to Others None reported or observed

## 2021-12-16 NOTE — Progress Notes (Signed)
D:  Patient's self inventory sheet, patient sleeps good, no sleep medication.  Good appetite, Rated depression, anxiety and hopeless 6.  Denied withdrawals.  Checked diarrhea.  Denied SI.  Physical problems, headaches, worse pain #6 in past 24 hours.  Pain medicine is helpful.  Goal is discharge date.  Will discuss medications with MD.   A:  Medications administered per MD orders.  Emotional support and encouragement given patient. R:  Denied SI and HI, contracts for safety.  Denied A/V hallucinations.  Safety maintained with 15 minute checks.

## 2021-12-16 NOTE — Group Note (Signed)
[facility] LCSW Group Therapy Note  Date/Time:  12/16/2021  11:00AM-12:00PM  Type of Therapy and Topic:  Group Therapy:  Music and Mood  Participation Level:  Minimal   Description of Group: In this process group, members listened to a variety of genres of music and identified that different types of music evoke different responses.  Patients were encouraged to identify music that was soothing for them and music that was energizing for them.  Patients discussed how this knowledge can help with wellness and recovery in various ways including managing depression and anxiety as well as encouraging healthy sleep habits.    Therapeutic Goals: Patients will explore the impact of different varieties of music on mood Patients will verbalize the thoughts they have when listening to different types of music Patients will identify music that is soothing to them as well as music that is energizing to them Patients will discuss how to use this knowledge to assist in maintaining wellness and recovery Patients will explore the use of music as a coping skill  Summary of Patient Progress:  At the beginning of group, patient was not present.  At the end of group, patient expressed her mood was "good" and "uplifted."  Of note, the patient came into the group room, sat staring at CSW for stretches of time.    Therapeutic Modalities: Solution Focused Brief Therapy Activity   Ambrose Mantle, LCSW

## 2021-12-16 NOTE — Plan of Care (Signed)
Nurse discussed anxiety, depression and coping skills with patient.  

## 2021-12-17 ENCOUNTER — Encounter (HOSPITAL_COMMUNITY): Payer: Self-pay

## 2021-12-17 DIAGNOSIS — F312 Bipolar disorder, current episode manic severe with psychotic features: Secondary | ICD-10-CM | POA: Diagnosis not present

## 2021-12-17 NOTE — Progress Notes (Signed)
Adult Psychoeducational Group Note  Date:  12/17/2021 Time:  9:18 PM  Group Topic/Focus:  Wrap-Up Group:   The focus of this group is to help patients review their daily goal of treatment and discuss progress on daily workbooks.  Participation Level:  Minimal  Participation Quality:  Appropriate  Affect:  Appropriate  Cognitive:  Appropriate  Insight: Appropriate  Engagement in Group:  Limited  Modes of Intervention:  Discussion  Additional Comments: Pt stated her goal for today was to focus on her treatment plan. Pt stated she accomplished her goal today. Pt stated she talked with her doctor but did not get a chance to speak with her social worker about her care today. Pt rated her overall day a 6 out of 10. Pt stated her boyfriend coming for visitation tonight improved her overall day. Pt stated she felt better about herself tonight. Pt stated she was able to attend all meals today. Pt stated she took all medications provided today. Pt stated her appetite was good today. Pt rated her sleep last night was good. Pt stated the goal tonight was to get some rest. Pt stated she had no physical pain tonight. Pt deny visual hallucinations and auditory issues tonight. Pt denies thoughts of harming herself or others. Pt stated she would alert staff if anything changed.   Felipa Furnace 12/17/2021, 9:18 PM

## 2021-12-17 NOTE — Progress Notes (Addendum)
[facility] MD Progress Note  12/17/2021 3:11 PM Natalie Brown  MRN:  [id number]  Reason for Admission:  Natalie Brown is a 56 y.o. female  who was initially admitted for inpatient psychiatric hospitalization on 09/06/2021 for management of mania, disorganized behaviors/thinking, and delusions. The patient is currently on Hospital Day 102.   Chart Review from last 24 hours:  V/S for the past 24 hrs have been WNL. She has taking all medications as scheduled without requiring any anti anxiety or agitation protocol medications for the past 24 hrs. Pt has attended some unit group sessions over the past 24 hrs, but chose not to participate. She has been noted to be disorganized, impaired & confused as per nursing documentation.   Information Obtained Today During Patient Interview: Pt is seen today in her room on the 500 hall. Pt with flat affect and depressed mood, attention to personal hygiene and grooming is poor, and the need to tend to personal hygiene and grooming has been reiterated. Eye contact is good, speech is clear & coherent. Thought are organized, but illogical at times. Pt is confused at times and blankly stares at Probation officer during interaction. She currently denies SI/HI/AVH or paranoia. She continues to state that she is able to speak multiple languages, which is a delusion at this point.   Pt denies any medication related side effects. No TD/EPS type symptoms found on assessment, and pt denies any feelings of stiffness. AIMS: 0. Mild drooling noted on assessment, but this is an improvement from what it was prior, as per staff reports. Pt reports a good sleep quality last night, and reports a good appetite. She denies being in any physical discomfort. We will continue all medications as listed below.  Principal Problem: Bipolar affective disorder, current episode manic with psychotic symptoms ([place]) Diagnosis: Principal Problem:   Bipolar affective disorder, current episode manic with psychotic  symptoms ([place]) Active Problems:   Major neurocognitive disorder ([place])  Past Psychiatric History: see H&P  Past Medical History:  Past Medical History:  Diagnosis Date   Bipolar 1 disorder with moderate mania ([place]) 12/30/2013   Hypertension    Family History: see H&P  Family Psychiatric  History: see H&P  Social History:  Homeless; mother is interim guardian   Sleep: Good - 6.5 hours  Appetite:  Good  Current Medications: Current Facility-Administered Medications  Medication Dose Route Frequency Provider Last Rate Last Admin   acetaminophen (TYLENOL) tablet 650 mg  650 mg Oral Q6H PRN Briant Cedar, MD   650 mg at 11/14/21 0759   alum & mag hydroxide-simeth (MAALOX/MYLANTA) 200-200-20 MG/5ML suspension 30 mL  30 mL Oral Q4H PRN Massengill, [name], MD       atropine 1 % ophthalmic solution 1 drop  1 drop Sublingual Q8H PRN Attiah, Nadir, MD   1 drop at 12/16/21 0830   benztropine (COGENTIN) tablet 0.5 mg  0.5 mg Oral BID Nelda Marseille, Amy E, MD   0.5 mg at 12/17/21 7494   docusate sodium (COLACE) capsule 100 mg  100 mg Oral Daily Nelda Marseille, Amy E, MD   100 mg at 12/17/21 0821   donepezil (ARICEPT) tablet 5 mg  5 mg Oral Daily Nelda Marseille, Amy E, MD   5 mg at 12/17/21 4967   feeding supplement (KATE FARMS STANDARD 1.4) liquid 325 mL  325 mL Oral BID BM Massengill, Nathan, MD   325 mL at 12/16/21 1404   fluticasone (FLONASE) 50 MCG/ACT nasal spray 1 spray  1 spray Each Nare  Daily PRN Harlow Asa, MD   1 spray at 12/13/21 3762   haloperidol (HALDOL) tablet 2 mg  2 mg Oral BID Harlow Asa, MD   2 mg at 12/17/21 8315   hydrocerin (EUCERIN) cream   Topical BID Janine Limbo, MD   Given at 12/17/21 1761   hydrOXYzine (ATARAX) tablet 25 mg  25 mg Oral TID PRN Janine Limbo, MD   25 mg at 12/14/21 2043   LORazepam (ATIVAN) tablet 1 mg  1 mg Oral Q6H PRN Harlow Asa, MD   1 mg at 10/07/21 1546   magnesium hydroxide (MILK OF MAGNESIA) suspension 30 mL  30 mL Oral  Daily PRN Massengill, [name], MD   30 mL at 11/14/21 0802   nicotine (NICODERM CQ - dosed in mg/24 hours) patch 14 mg  14 mg Transdermal Daily Nelda Marseille, Amy E, MD   14 mg at 12/17/21 0822   OLANZapine zydis (ZYPREXA) disintegrating tablet 5 mg  5 mg Oral Q8H PRN Harlow Asa, MD   5 mg at 10/14/21 1416   And   ziprasidone (GEODON) injection 20 mg  20 mg Intramuscular PRN Harlow Asa, MD       ondansetron [facility]) tablet 4 mg  4 mg Oral Q8H PRN Harlow Asa, MD   4 mg at 11/23/21 6073   polyethylene glycol (MIRALAX / GLYCOLAX) packet 17 g  17 g Oral Daily PRN Harlow Asa, MD       propranolol (INDERAL) tablet 10 mg  10 mg Oral BID Harlow Asa, MD   10 mg at 12/17/21 7106   Lab Results:  No results found for this or any previous visit (from the past 1 hour(s)).   Blood Alcohol level:  Lab Results  Component Value Date   ETH <10 26/94/8546   Metabolic Disorder Labs: Lab Results  Component Value Date   HGBA1C 5.3 09/08/2021   MPG 105.41 09/08/2021   No results found for: "PROLACTIN" Lab Results  Component Value Date   CHOL 173 09/08/2021   TRIG 110 09/08/2021   HDL 50 09/08/2021   CHOLHDL 3.5 09/08/2021   VLDL 22 09/08/2021   LDLCALC 101 (H) 09/08/2021   LDLCALC 102 (H) 06/21/2020   Physical Findings:  AIMS: Facial and Oral Movements Muscles of Facial Expression: None, normal Lips and Perioral Area: None, normal Jaw: None, normal Tongue: None, normal,Extremity Movements Upper (arms, wrists, hands, fingers): None, normal Lower (legs, knees, ankles, toes): None, normal, Trunk Movements Neck, shoulders, hips: None, normal, Overall Severity Severity of abnormal movements (highest score from questions above): None, normal Incapacitation due to abnormal movements: None, normal Patient's awareness of abnormal movements (rate only patient's report): No Awareness, Dental Status Current problems with teeth and/or dentures?: No Does patient usually wear  dentures?: No   Musculoskeletal: Strength & Muscle Tone: normal Gait & Station: normal Patient leans: N/A  Psychiatric Specialty Exam:  Presentation  General Appearance: unkempt appearing  - wearing hair bonnet, wearing same dirty clothes for last several days - face appears unwashed and mildly malodorous  Eye Contact:Fair - glaring quality at times  Speech: speaks in short phrases to direct questions  Speech Volume:Normal  Mood and Affect  Mood:appears mildly anxious and confused  Affect: calm, polite, confused appearing  Thought Process  Thought Processes: concrete, confused at times  Orientation: Partially oriented to time place and situation  Thought Content: Denies SI HI or AVH, does not present paranoid or responding to stimuli. Hallucinations:Denied  Ideas of Reference:Denied  Suicidal Thoughts:Denied   Homicidal Thoughts:Denied   Sensorium  Memory:Poor  Judgment:Poor  Insight:Poor  Executive Functions  Concentration:Poor  Attention Span:Poor  Recall:Poor  Fund of Knowledge:Limited  Language:Fair  Psychomotor Activity  Psychomotor Activity:no restlessness, akathisia or tremors noted, AIMS 0  Assets  Assets:Resilience  Physical Exam Vitals and nursing note reviewed.  Constitutional:      Appearance: Normal appearance.  HENT:     Head: Normocephalic.     Mouth/Throat:     Comments: Increased drooling on exam Eyes:     Pupils: Pupils are equal, round, and reactive to light.  Pulmonary:     Effort: Pulmonary effort is normal.  Neurological:     General: No focal deficit present.     Mental Status: [name] alert.    Review of Systems  Constitutional: Negative.   HENT: Negative.    Eyes: Negative.   Respiratory: Negative.  Negative for shortness of breath.   Cardiovascular: Negative.  Negative for chest pain and palpitations.  Gastrointestinal: Negative.  Negative for abdominal pain, constipation, diarrhea, nausea and vomiting.   Genitourinary: Negative.   Musculoskeletal: Negative.   Skin: Negative.   Neurological: Negative.  Negative for dizziness and headaches.  Psychiatric/Behavioral:  Positive for memory loss. Negative for depression, hallucinations, substance abuse and suicidal ideas. The patient is not nervous/anxious and does not have insomnia.    Blood pressure 118/88, pulse 100, temperature 98.3 F (36.8 C), temperature source Oral, resp. rate 16, height 5' 6"  (1.676 m), weight 72.6 kg, SpO2 97 %. Body mass index is 25.83 kg/m.  Treatment Plan Summary:  Diagnoses / Active Problems: Bipolar I MRE manic with psychotic features Major Neurocognitive d/o with behavioral disturbance  PLAN: Safety and Monitoring:  -- Voluntary admission (signed in by guardian for treatment on 11/19/21) to inpatient psychiatric unit for safety, stabilization and treatment  -- Daily contact with patient to assess and evaluate symptoms and progress in treatment  -- Patient's case to be discussed in multi-disciplinary team meeting  -- Observation Level : q15 minute checks  -- Vital signs:  q12 hours  -- Precautions: Safety 2. Psychiatric Diagnoses and Treatment:   Bipolar I MRE manic with psychotic features  Major neurocognitive disorder with behavioral disturbance (marked for age brain atrophy on CT head and MOCA 11/30) -Continue Aricept 5 mg daily for major neurocognitive disorder with plan to titrate to 10 mg on 9/4   -Continue Haldol 15m bid - (discussed with guardian and will continue present dose with hope of attempting to taper off in future if tolerated) - Continue Cogentin 0.573mbid and monitor for EPS -Continue atropine ophthalmologic gtts 1 drop q8 hour SL PRN drooling (guardian consents to med trial) -Continue Inderal 1018mid for help with anxiety and tachycardia (guardian consents to med trial) -InLorayne Benderstenna 234 mg IM given on 09-30-21, 156m30m given 10/12/20 - Invega 78 mg given on 7/21; Invega 78mg54men  8/18 with consent by guardian -- Zyprexa/Geodon/ativan agitation protocol PRN   --Additional psychosis labs (HIV- , RPR-, ANA-, heavy metal WNL, ESR wnl, ceruloplasmin wnl and CT head shows Marked for age brain atrophy.  No acute or reversible finding.)   --Depakote, Ativan, and Zyprexa stopped -- ACTT referral pending -- Continue Aricept 5mg d21my for memory issues -- Interim guardianship in place with mother and waiting on SSDI and medicaid for group home referral -- working on prompts to allow staff to assist with ADLs and hygiene but she is resistant  3. Medical Issues Being Addressed:   Tobacco Use Disorder  -- Continue Nicotine patch 6m Q day    Hyperammonemia -resolved --Ammonia level 30 on 10/27/21 and 25 on 8/4 --Depakote was stopped     Elevated creatinine  --Creatinine of 1.42 on 8/16 - on repeat today 1.19 with GFR 54 -- Continue to encourage po fluids and trend     Seasonal allergies -Continue Flonase PRN per guardian approval  Constipation - improved -- Continue colace 1043mqd dosing  -- PRN Miralax for severe constipation  Nausea/Vomiting -resolved -- SLP saw for swallow assessment and feels is cognitively based dysphagia - no limitations continue regular diet and thin liquid -- Continue to monitor during meals meals to help with portion control   Tachycardia  -- EKG on 8/6 with QTC-417. Continuing Inderal 1045mid - checking orthostatic vitals Q day  Hypokalemia - resolved - Received K+ 20 meq bid and repeat BMP 8/18 shows K+ 4.0  4. Discharge Planning:   -- Social work and case management to assist with discharge planning and identification of hospital follow-up needs prior to discharge  -- Discharge Concerns: Need to establish a safety plan; Medication compliance and effectiveness; safe housing   -- Discharge Goals: ALF or group home with outpatient referrals for mental health follow-up including medication management/psychotherapy  Labs On Admission-  CMP: WNL except Cr: 1.1,  GFR: 60,  CBC: WNL,  UDS: Neg,  Troponin: 3,  EtOH: Neg,  beta hCG: 6.4,  Resp Panel: Neg 5/29- Urine Preg: Neg,  A1c: 5.3,  TSH: 1.719,  Lipid Panel: WNL except LDL: 101 5/24- BMP: WNL except Cr: 1.01,  Ca: 8.4,  hCG: 5,  Urine Preg: Neg 5/28- Ammonia: 32,  CBC: WNL,  CMP: WNL except  Cr: 1.08,  Ca: 8.6,  Depakote lvl: 20 6/3- Depakote lvl: 64, Hep Function: WNL,  CBC: WNL except Hem: 11.9 6/7- Depakote lvl: 75 6/18- UA: Neg,  CBC: WNL,  Depakote lvl: 136,  Ammonia: 99,  BMP: WNL except BUN: 22,  Cr: 1.19,  Ca: 8.6,  GFR: 54,  Hep Function: WNL except Albu: 3.3,  Indirect Bili: 0.2 6/20- ANA: Neg,  Ammonia: 48,  Ceruloplasmin: 24.2,  CMP: WNL except BUN: 23,  Cr: 1.35,  Ca: 8.3,  Albu: 3.4  HIV:Neg,  RPR: Neg,  Sed Rate: 6,  Depakote lvl: 25,  Heavy Metal: Neg 6/23- Ammonia: 46,  BMP: WNL except Cr: 1.2,  Ca: 8.3,  GFR: 54 6/24- UA: WNL except Bacteria: rare 6/30- CMP:  WNL except Cr: 1.04,  Ca: 8.6,  CBC: WNL,  Ammonia: 41, EKG: NSR with Qtc: 399 7/8 - CMP WNL other than CR 1.33 and GFR 48; Ammonia 30 7/18 - Cr of 1.22, eGFR of 53 7/23 - EKG with Qtc of 422 7/31 - creatinine 1.08 8/1 - creatinine 1.24 8/3 - COVID negative 8/4 - CBC WNL, NH3 25, CMP WNL other than creatinine 1.05 and glucose 122, lipase WNL 8/6 - CXR negative and COVID negative 8/16 - Creatinine 1.42 with GFR 44, glucose 146 and K+ 3.4 otherwise BMP WNL 8/18 - Creatinine 1.19 with GFR 54, glucose 110, and CL 112 and K+ 4.0  Natalie Brown 12/17/2021, 3:11 PM        Patient ID: AngManuella Ghaziemale   DOB: [birth year] 78o.   MRN: [id number]

## 2021-12-17 NOTE — Group Note (Signed)
Recreation Therapy Group Note   Group Topic:Coping Skills  Group Date: 12/17/2021 Start Time: 1000 End Time: 1035 Facilitators: Caroll Rancher, LRT,CTRS Location: 500 Hall Dayroom   Goal Area(s) Addresses:  Patient will identify positive coping skill techniques. Patient will identify benefits of using positive coping skills post d/c.  Group Description: Mind Map.  Patient was provided a blank template of a diagram with 32 blank boxes in a tiered system, branching from the center (similar to a bubble chart). LRT directed patients to label the middle of the diagram "Coping Skills" and consider 8 different sources in which coping skills would be needed.  Patients and LRT filled in the first 8 boxes together with 8 sources coping skills could be used (anger, stress, isolation, emotions, depression, anxiety, relationships and communication/cooperation.  Patients were to then come up with 3 effective coping techniques to address each identified area in the remaining boxes stemming from a particular source. Pts were encouraged to share ideas with one another and ask for suggestions of peers and Clinical research associate when stuck on a certain category.   Affect/Mood: Flat   Participation Level: Moderate   Participation Quality: Moderate Cues   Behavior: Confused   Speech/Thought Process: Incoherent   Insight: Lacking   Judgement: Lacking    Modes of Intervention: Worksheet   Patient Response to Interventions:  Attentive   Education Outcome:  Acknowledges education and In group clarification offered    Clinical Observations/Individualized Feedback:  Pt was confused.  Pt needed things repeated to her and she still couldn't understand the instructions.  Pt came up to the front to write the things off the board onto her paper. Pt had to be told multiple times to move to the side as to not peers view.  Pt identified spirituality and finances as coping skills.    Plan: Continue to engage patient in RT  group sessions 2-3x/week.   Caroll Rancher, LRT,CTRS 12/17/2021 12:30 PM

## 2021-12-17 NOTE — BH IP Treatment Plan (Signed)
Interdisciplinary Treatment and Diagnostic Plan Update  12/17/2021 Time of Session: 0830 Natalie Brown MRN: [id number]  Principal Diagnosis: Bipolar affective disorder, current episode manic with psychotic symptoms (HCC)  Secondary Diagnoses: Principal Problem:   Bipolar affective disorder, current episode manic with psychotic symptoms (HCC) Active Problems:   Major neurocognitive disorder (HCC)   Current Medications:  Current Facility-Administered Medications  Medication Dose Route Frequency Provider Last Rate Last Admin   acetaminophen (TYLENOL) tablet 650 mg  650 mg Oral Q6H PRN Lauro Franklin, MD   650 mg at 11/14/21 0759   alum & mag hydroxide-simeth (MAALOX/MYLANTA) 200-200-20 MG/5ML suspension 30 mL  30 mL Oral Q4H PRN Massengill, Harrold Donath, MD       atropine 1 % ophthalmic solution 1 drop  1 drop Sublingual Q8H PRN Attiah, Nadir, MD   1 drop at 12/16/21 0830   benztropine (COGENTIN) tablet 0.5 mg  0.5 mg Oral BID Mason Jim, Amy E, MD   0.5 mg at 12/17/21 9417   docusate sodium (COLACE) capsule 100 mg  100 mg Oral Daily Mason Jim, Amy E, MD   100 mg at 12/17/21 4081   donepezil (ARICEPT) tablet 5 mg  5 mg Oral Daily Mason Jim, Amy E, MD   5 mg at 12/17/21 4481   feeding supplement (KATE FARMS STANDARD 1.4) liquid 325 mL  325 mL Oral BID BM Massengill, Nathan, MD   325 mL at 12/16/21 1404   fluticasone (FLONASE) 50 MCG/ACT nasal spray 1 spray  1 spray Each Nare Daily PRN Comer Locket, MD   1 spray at 12/13/21 8563   haloperidol (HALDOL) tablet 2 mg  2 mg Oral BID Comer Locket, MD   2 mg at 12/17/21 1497   hydrocerin (EUCERIN) cream   Topical BID Phineas Inches, MD   Given at 12/17/21 0263   hydrOXYzine (ATARAX) tablet 25 mg  25 mg Oral TID PRN Phineas Inches, MD   25 mg at 12/14/21 2043   LORazepam (ATIVAN) tablet 1 mg  1 mg Oral Q6H PRN Comer Locket, MD   1 mg at 10/07/21 1546   magnesium hydroxide (MILK OF MAGNESIA) suspension 30 mL  30 mL Oral Daily PRN  Massengill, Harrold Donath, MD   30 mL at 11/14/21 0802   nicotine (NICODERM CQ - dosed in mg/24 hours) patch 14 mg  14 mg Transdermal Daily Mason Jim, Amy E, MD   14 mg at 12/17/21 0822   OLANZapine zydis (ZYPREXA) disintegrating tablet 5 mg  5 mg Oral Q8H PRN Comer Locket, MD   5 mg at 10/14/21 1416   And   ziprasidone (GEODON) injection 20 mg  20 mg Intramuscular PRN Comer Locket, MD       ondansetron (ZOFRAN) tablet 4 mg  4 mg Oral Q8H PRN Bartholomew Crews E, MD   4 mg at 11/23/21 7858   polyethylene glycol (MIRALAX / GLYCOLAX) packet 17 g  17 g Oral Daily PRN Comer Locket, MD       propranolol (INDERAL) tablet 10 mg  10 mg Oral BID Bartholomew Crews E, MD   10 mg at 12/17/21 8502   PTA Medications: Medications Prior to Admission  Medication Sig Dispense Refill Last Dose   CLARITIN 10 MG tablet Take 10 mg by mouth daily.      fluticasone (FLONASE) 50 MCG/ACT nasal spray Place 1 spray into both nostrils daily.      pseudoephedrine-guaifenesin (MUCINEX D) 60-600 MG 12 hr tablet Take 1 tablet by mouth every  12 (twelve) hours. 30 tablet 0     Patient Stressors: Health problems   Medication change or noncompliance    Patient Strengths: Capable of independent living  Printmaker for treatment/growth  Supportive family/friends   Treatment Modalities: Medication Management, Group therapy, Case management,  1 to 1 session with clinician, Psychoeducation, Recreational therapy.   Physician Treatment Plan for Primary Diagnosis: Bipolar affective disorder, current episode manic with psychotic symptoms (HCC) Long Term Goal(s): Improvement in symptoms so as ready for discharge   Short Term Goals: Ability to identify and develop effective coping behaviors will improve Ability to maintain clinical measurements within normal limits will improve Compliance with prescribed medications will improve Ability to identify changes in lifestyle to reduce recurrence of condition will  improve Ability to verbalize feelings will improve Ability to disclose and discuss suicidal ideas Ability to demonstrate self-control will improve  Medication Management: Evaluate patient's response, side effects, and tolerance of medication regimen.  Therapeutic Interventions: 1 to 1 sessions, Unit Group sessions and Medication administration.  Evaluation of Outcomes: Adequate for Discharge  Physician Treatment Plan for Secondary Diagnosis: Principal Problem:   Bipolar affective disorder, current episode manic with psychotic symptoms (HCC) Active Problems:   Major neurocognitive disorder (HCC)  Long Term Goal(s): Improvement in symptoms so as ready for discharge   Short Term Goals: Ability to identify and develop effective coping behaviors will improve Ability to maintain clinical measurements within normal limits will improve Compliance with prescribed medications will improve Ability to identify changes in lifestyle to reduce recurrence of condition will improve Ability to verbalize feelings will improve Ability to disclose and discuss suicidal ideas Ability to demonstrate self-control will improve     Medication Management: Evaluate patient's response, side effects, and tolerance of medication regimen.  Therapeutic Interventions: 1 to 1 sessions, Unit Group sessions and Medication administration.  Evaluation of Outcomes: Adequate for Discharge   RN Treatment Plan for Primary Diagnosis: Bipolar affective disorder, current episode manic with psychotic symptoms (HCC) Long Term Goal(s): Knowledge of disease and therapeutic regimen to maintain health will improve  Short Term Goals: Ability to remain free from injury will improve, Ability to verbalize frustration and anger appropriately will improve, Ability to demonstrate self-control, Ability to participate in decision making will improve, Ability to verbalize feelings will improve, Ability to disclose and discuss suicidal ideas,  Ability to identify and develop effective coping behaviors will improve, and Compliance with prescribed medications will improve  Medication Management: RN will administer medications as ordered by provider, will assess and evaluate patient's response and provide education to patient for prescribed medication. RN will report any adverse and/or side effects to prescribing provider.  Therapeutic Interventions: 1 on 1 counseling sessions, Psychoeducation, Medication administration, Evaluate responses to treatment, Monitor vital signs and CBGs as ordered, Perform/monitor CIWA, COWS, AIMS and Fall Risk screenings as ordered, Perform wound care treatments as ordered.  Evaluation of Outcomes: Adequate for Discharge   LCSW Treatment Plan for Primary Diagnosis: Bipolar affective disorder, current episode manic with psychotic symptoms (HCC) Long Term Goal(s): Safe transition to appropriate next level of care at discharge, Engage patient in therapeutic group addressing interpersonal concerns.  Short Term Goals: Engage patient in aftercare planning with referrals and resources, Increase social support, Increase ability to appropriately verbalize feelings, Increase emotional regulation, Facilitate acceptance of mental health diagnosis and concerns, Facilitate patient progression through stages of change regarding substance use diagnoses and concerns, Identify triggers associated with mental health/substance abuse issues, and Increase skills for wellness  and recovery  Therapeutic Interventions: Assess for all discharge needs, 1 to 1 time with Child psychotherapist, Explore available resources and support systems, Assess for adequacy in community support network, Educate family and significant other(s) on suicide prevention, Complete Psychosocial Assessment, Interpersonal group therapy.  Evaluation of Outcomes: Adequate for Discharge   Progress in Treatment: Attending groups: Yes. Participating in groups: Yes. Taking  medication as prescribed: Yes. Toleration medication: Yes. Family/Significant other contact made: Yes, individual(s) contacted:  [name], mother/legal guardian Patient understands diagnosis: Yes. Discussing patient identified problems/goals with staff: Yes. Medical problems stabilized or resolved: Yes. Denies suicidal/homicidal ideation: Yes. Issues/concerns per patient self-inventory: Yes. Other: none  New problem(s) identified: Yes, Describe:  none  New Short Term/Long Term Goal(s): Patient to work towards elimination of symptoms of psychosis, medication management for mood stabilization; development of comprehensive mental wellness plan.  Patient Goals:  No additional goals identified at this time. Patient to continue to work towards original goals identified in initial treatment team meeting. CSW will remain available to patient should they voice additional treatment goals.   Discharge Plan or Barriers: No psychosocial barriers identified at this time, patient to return to place of residence when appropriate for discharge.   Reason for Continuation of Hospitalization: N/A, patient is adequate for discharge. Currently boarding due to no funds available for necessary level of care.   Estimated Length of Stay:  Last 3 [place] Suicide Severity Risk Score: Flowsheet Row Ad[facility] (Current) [facility] from 09/05/2021 in [facility] ED from 05/26/2020 in [facility]  C-SSRS RISK CATEGORY High Risk High Risk No Risk       Last PHQ 2/9 Scores:    05/26/2020    2:14 PM 09/03/2017    4:34 PM 08/07/2016    2:12 PM  Depression screen PHQ 2/9  Decreased Interest 3 3 3   Down, Depressed, Hopeless 3 1 2   PHQ - 2 Score 6 4 5   Altered sleeping 0 3 3  Tired, decreased energy 0 3 0  Change in appetite 3 3 0  Feeling bad or failure about yourself  0 3 1  Trouble  concentrating 3 0 0  Moving slowly or fidgety/restless 3 0 1  Suicidal thoughts 0 0 0  PHQ-9 Score 15 16 10     Scribe for Treatment Team: 12/17/2021 2:42 PM

## 2021-12-17 NOTE — Group Note (Signed)
LCSW Group Therapy Note   Group Date: 12/17/2021 Start Time: 1300 End Time: 1400   Type of Therapy and Topic:  Group Therapy: Boundaries  Participation Level:  Did Not Attend  Description of Group: This group will address the use of boundaries in their personal lives. Patients will explore why boundaries are important, the difference between healthy and unhealthy boundaries, and negative and postive outcomes of different boundaries and will look at how boundaries can be crossed.  Patients will be encouraged to identify current boundaries in their own lives and identify what kind of boundary is being set. Facilitators will guide patients in utilizing problem-solving interventions to address and correct types boundaries being used and to address when no boundary is being used. Understanding and applying boundaries will be explored and addressed for obtaining and maintaining a balanced life. Patients will be encouraged to explore ways to assertively make their boundaries and needs known to significant others in their lives, using other group members and facilitator for role play, support, and feedback.  Therapeutic Goals:  1.  Patient will identify areas in their life where setting clear boundaries could be  used to improve their life.  2.  Patient will identify signs/triggers that a boundary is not being respected. 3.  Patient will identify two ways to set boundaries in order to achieve balance in  their lives: 4.  Patient will demonstrate ability to communicate their needs and set boundaries  through discussion and/or role plays  Summary of Patient Progress:  Did not attend  Therapeutic Modalities:   Cognitive Behavioral Therapy Solution-Focused Therapy  Beatris Si, LCSW 12/17/2021  2:08 PM

## 2021-12-17 NOTE — Progress Notes (Signed)
   12/17/21 2115  Psych Admission Type (Psych Patients Only)  Admission Status Involuntary  Psychosocial Assessment  Patient Complaints None  Eye Contact Brief  Facial Expression Flat  Affect Appropriate to circumstance  Speech Soft  Interaction Cautious  Motor Activity Slow  Appearance/Hygiene Disheveled  Behavior Characteristics Cooperative  Mood Preoccupied  Aggressive Behavior  Effect No apparent injury  Thought Process  Coherency Disorganized  Content WDL  Delusions None reported or observed  Perception WDL  Hallucination None reported or observed  Judgment Impaired  Confusion None  Danger to Self  Current suicidal ideation? Denies  Danger to Others  Danger to Others None reported or observed  Danger to Others Abnormal  Harmful Behavior to others No threats or harm toward other people

## 2021-12-18 DIAGNOSIS — F312 Bipolar disorder, current episode manic severe with psychotic features: Secondary | ICD-10-CM | POA: Diagnosis not present

## 2021-12-18 NOTE — Progress Notes (Signed)
   12/18/21 0515  Sleep  Number of Hours 6

## 2021-12-18 NOTE — Progress Notes (Signed)
Adult Psychoeducational Group Note  Date:  12/18/2021 Time:  9:28 PM  Group Topic/Focus:  Wrap-Up Group:   The focus of this group is to help patients review their daily goal of treatment and discuss progress on daily workbooks.  Participation Level:  Did Not Attend  Participation Quality:   Did Not Attend  Affect:   Did Not Attend  Cognitive:   Did Not Attend   Insight: None  Engagement in Group:   Did Not Attend  Modes of Intervention:   Did Not Attend   Additional Comments:   Pt was encouraged to attend wrap up group but did not attend. Felipa Furnace 12/18/2021, 9:28 PM

## 2021-12-18 NOTE — Progress Notes (Signed)
Patient did not attend morning orientation/goal setting group, although she was made aware of it.  

## 2021-12-18 NOTE — Progress Notes (Signed)
   12/18/21 2115  Psych Admission Type (Psych Patients Only)  Admission Status Involuntary  Psychosocial Assessment  Patient Complaints None  Eye Contact Brief  Facial Expression Flat  Affect Appropriate to circumstance  Speech Soft  Interaction Cautious  Motor Activity Slow  Appearance/Hygiene Disheveled  Behavior Characteristics Cooperative  Mood Preoccupied  Aggressive Behavior  Effect No apparent injury  Thought Process  Coherency Disorganized  Content WDL  Delusions None reported or observed  Perception WDL  Hallucination None reported or observed  Judgment Impaired  Confusion None  Danger to Self  Current suicidal ideation? Denies  Danger to Others  Danger to Others None reported or observed  Danger to Others Abnormal  Harmful Behavior to others No threats or harm toward other people

## 2021-12-18 NOTE — Progress Notes (Signed)
   12/18/21 1200  Psych Admission Type (Psych Patients Only)  Admission Status Involuntary  Psychosocial Assessment  Patient Complaints None  Eye Contact Brief  Facial Expression Flat  Affect Appropriate to circumstance  Speech Soft  Interaction Minimal  Motor Activity Slow  Appearance/Hygiene Disheveled  Behavior Characteristics Cooperative  Mood Preoccupied  Thought Process  Coherency Disorganized  Content WDL  Delusions None reported or observed  Perception WDL  Hallucination None reported or observed  Judgment Impaired  Confusion None  Danger to Self  Current suicidal ideation? Denies  Danger to Others  Danger to Others None reported or observed

## 2021-12-18 NOTE — Progress Notes (Signed)
[facility] MD Progress Note  12/18/2021 1:53 PM ALANTIS BETHUNE  MRN:  [id number]  Reason for Admission:  Natalie Brown is a 56 y.o. female  who was initially admitted for inpatient psychiatric hospitalization on 09/06/2021 for management of mania, disorganized behaviors/thinking, and delusions. The patient is currently on Hospital Day 103.   Chart Review from last 24 hours:  V/S for the past 24 hrs have been WNL with the exception of a slightly elevated HR at 103 earlier today morning. [name] continuing to take all medications as scheduled for the past 24hrs. She has not required any anti anxiety or agitation protocol medications for the past 24 hrs. Pt has attended some unit group sessions over the past 24 hrs, and as per nursing flow sheets, she slept for a total of 6 hrs last night.  Information Obtained Today During Patient Interview: Pt is seen today in her room on the 500 hall sleeping, but is easily arouses and engages in conversation with Probation officer. She presents with a depressed mood and affect is congruent and flat.  She states that she is at the [facility], but knows that today is August 29th 2023, and immediately states: "I've been here too long, and I want to leave. I want to go home." She states that she would like to talk to "Legrand Como" regarding discharge. Pt reoriented to place & situation, and educated that discharge is pending a safe discharge disposition, but seems not to comprehend this information.   Pt denies any medication related side effects. No TD/EPS type symptoms found on assessment, and pt denies any feelings of stiffness. AIMS: 0. Mild drooling noted on assessment, but this is an improvement from what it was prior, as per staff reports. Pt continues to report a good sleep quality & states she slept well last night, and reports a good appetite as well. She denies being in any physical discomfort. We will continue all medications as listed below.  Principal Problem:  Bipolar affective disorder, current episode manic with psychotic symptoms ([place]) Diagnosis: Principal Problem:   Bipolar affective disorder, current episode manic with psychotic symptoms ([place]) Active Problems:   Major neurocognitive disorder ([place])  Past Psychiatric History: see H&P  Past Medical History:  Past Medical History:  Diagnosis Date   Bipolar 1 disorder with moderate mania ([place]) 12/30/2013   Hypertension    Family History: see H&P  Family Psychiatric  History: see H&P  Social History:  Homeless; mother is interim guardian   Sleep: Good - 6.5 hours  Appetite:  Good  Current Medications: Current Facility-Administered Medications  Medication Dose Route Frequency Provider Last Rate Last Admin   acetaminophen (TYLENOL) tablet 650 mg  650 mg Oral Q6H PRN Briant Cedar, MD   650 mg at 11/14/21 0759   alum & mag hydroxide-simeth (MAALOX/MYLANTA) 200-200-20 MG/5ML suspension 30 mL  30 mL Oral Q4H PRN Massengill, [name], MD       atropine 1 % ophthalmic solution 1 drop  1 drop Sublingual Q8H PRN Winfred Leeds, Nadir, MD   1 drop at 12/18/21 0939   benztropine (COGENTIN) tablet 0.5 mg  0.5 mg Oral BID Nelda Marseille, Amy E, MD   0.5 mg at 12/18/21 0934   docusate sodium (COLACE) capsule 100 mg  100 mg Oral Daily Nelda Marseille, Amy E, MD   100 mg at 12/18/21 0934   donepezil (ARICEPT) tablet 5 mg  5 mg Oral Daily Harlow Asa, MD   5 mg at 12/18/21 [phone]  feeding supplement (KATE FARMS STANDARD 1.4) liquid 325 mL  325 mL Oral BID BM Massengill, Nathan, MD   325 mL at 12/16/21 1404   fluticasone (FLONASE) 50 MCG/ACT nasal spray 1 spray  1 spray Each Nare Daily PRN Harlow Asa, MD   1 spray at 12/18/21 0941   haloperidol (HALDOL) tablet 2 mg  2 mg Oral BID Harlow Asa, MD   2 mg at 12/18/21 5176   hydrocerin (EUCERIN) cream   Topical BID Janine Limbo, MD   Given at 12/17/21 1607   hydrOXYzine (ATARAX) tablet 25 mg  25 mg Oral TID PRN Janine Limbo, MD   25 mg at  12/17/21 2027   LORazepam (ATIVAN) tablet 1 mg  1 mg Oral Q6H PRN Harlow Asa, MD   1 mg at 10/07/21 1546   magnesium hydroxide (MILK OF MAGNESIA) suspension 30 mL  30 mL Oral Daily PRN Massengill, [name], MD   30 mL at 11/14/21 0802   nicotine (NICODERM CQ - dosed in mg/24 hours) patch 14 mg  14 mg Transdermal Daily Nelda Marseille, Amy E, MD   14 mg at 12/18/21 0935   OLANZapine zydis (ZYPREXA) disintegrating tablet 5 mg  5 mg Oral Q8H PRN Harlow Asa, MD   5 mg at 10/14/21 1416   And   ziprasidone (GEODON) injection 20 mg  20 mg Intramuscular PRN Harlow Asa, MD       ondansetron [facility]) tablet 4 mg  4 mg Oral Q8H PRN Harlow Asa, MD   4 mg at 11/23/21 3710   polyethylene glycol (MIRALAX / GLYCOLAX) packet 17 g  17 g Oral Daily PRN Harlow Asa, MD       propranolol (INDERAL) tablet 10 mg  10 mg Oral BID Harlow Asa, MD   10 mg at 12/18/21 6269   Lab Results:  No results found for this or any previous visit (from the past 60 hour(s)).   Blood Alcohol level:  Lab Results  Component Value Date   ETH <10 48/54/6270   Metabolic Disorder Labs: Lab Results  Component Value Date   HGBA1C 5.3 09/08/2021   MPG 105.41 09/08/2021   No results found for: "PROLACTIN" Lab Results  Component Value Date   CHOL 173 09/08/2021   TRIG 110 09/08/2021   HDL 50 09/08/2021   CHOLHDL 3.5 09/08/2021   VLDL 22 09/08/2021   LDLCALC 101 (H) 09/08/2021   LDLCALC 102 (H) 06/21/2020   Physical Findings:  AIMS: Facial and Oral Movements Muscles of Facial Expression: None, normal Lips and Perioral Area: None, normal Jaw: None, normal Tongue: None, normal,Extremity Movements Upper (arms, wrists, hands, fingers): None, normal Lower (legs, knees, ankles, toes): None, normal, Trunk Movements Neck, shoulders, hips: None, normal, Overall Severity Severity of abnormal movements (highest score from questions above): None, normal Incapacitation due to abnormal movements: None,  normal Patient's awareness of abnormal movements (rate only patient's report): No Awareness, Dental Status Current problems with teeth and/or dentures?: No Does patient usually wear dentures?: No   Musculoskeletal: Strength & Muscle Tone: normal Gait & Station: normal Patient leans: N/A  Psychiatric Specialty Exam:  Presentation  General Appearance: unkempt appearing  - wearing hair bonnet, wearing same dirty clothes for last several days - face appears unwashed and mildly malodorous  Eye Contact:Fair - glaring quality at times  Speech: speaks in short phrases to direct questions  Speech Volume:Normal  Mood and Affect  Mood:appears mildly anxious and confused  Affect: calm,  polite, confused appearing  Thought Process  Thought Processes: concrete, confused at times  Orientation: Partially oriented to time place and situation  Thought Content: Denies SI HI or AVH, does not present paranoid or responding to stimuli. Hallucinations:Denied  Ideas of Reference:Denied  Suicidal Thoughts:Denied   Homicidal Thoughts:Denied   Sensorium  Memory:Poor  Judgment:Poor  Insight:Poor  Executive Functions  Concentration:Poor  Attention Span:Poor  Recall:Poor  Fund of Knowledge:Limited  Language:Fair  Psychomotor Activity  Psychomotor Activity:no restlessness, akathisia or tremors noted, AIMS 0  Assets  Assets:Resilience  Physical Exam Vitals and nursing note reviewed.  Constitutional:      Appearance: Normal appearance.  HENT:     Head: Normocephalic.     Mouth/Throat:     Comments: Increased drooling on exam Eyes:     Pupils: Pupils are equal, round, and reactive to light.  Pulmonary:     Effort: Pulmonary effort is normal.  Neurological:     General: No focal deficit present.     Mental Status: [name] alert.    Review of Systems  Constitutional: Negative.   HENT: Negative.    Eyes: Negative.   Respiratory: Negative.  Negative for shortness of  breath.   Cardiovascular: Negative.  Negative for chest pain and palpitations.  Gastrointestinal: Negative.  Negative for abdominal pain, constipation, diarrhea, nausea and vomiting.  Genitourinary: Negative.   Musculoskeletal: Negative.   Skin: Negative.   Neurological: Negative.  Negative for dizziness and headaches.  Psychiatric/Behavioral:  Positive for memory loss. Negative for depression, hallucinations, substance abuse and suicidal ideas. The patient is not nervous/anxious and does not have insomnia.    Blood pressure (!) 122/93, pulse (!) 105, temperature 98.2 F (36.8 C), temperature source Oral, resp. rate 16, height _0  (1.676 m), weight 72.6 kg, SpO2 99 %. Body mass index is 25.83 kg/m.  Treatment Plan Summary:  Diagnoses / Active Problems: Bipolar I MRE manic with psychotic features Major Neurocognitive d/o with behavioral disturbance  PLAN: Safety and Monitoring:  -- Voluntary admission (signed in by guardian for treatment on 11/19/21) to inpatient psychiatric unit for safety, stabilization and treatment  -- Daily contact with patient to assess and evaluate symptoms and progress in treatment  -- Patient's case to be discussed in multi-disciplinary team meeting  -- Observation Level : q15 minute checks  -- Vital signs:  q12 hours  -- Precautions: Safety 2. Psychiatric Diagnoses and Treatment:   Bipolar I MRE manic with psychotic features  Major neurocognitive disorder with behavioral disturbance (marked for age brain atrophy on CT head and MOCA 11/30) -Continue Aricept 5 mg daily for major neurocognitive disorder with plan to titrate to 10 mg on 9/4   -Continue Haldol 71m bid - (Guardian aware and will continue present dose with hope of attempting to taper off in future if tolerated) - Continue Cogentin 0.527mbid and monitor for EPS -Continue atropine ophthalmologic gtts 1 drop q8 hour SL PRN drooling (guardian consents to med trial) -Continue Inderal 1046mid for help  with anxiety and tachycardia (guardian consents to med trial) -InLorayne Benderstenna 234 mg IM given on 09-30-21, 156m56m given 10/12/20 - Invega 78 mg given on 7/21; Invega 78mg65men 8/18 with consent by guardian -- Zyprexa/Geodon/ativan agitation protocol PRN   --Additional psychosis labs (HIV- , RPR-, ANA-, heavy metal WNL, ESR wnl, ceruloplasmin wnl and CT head shows Marked for age brain atrophy.  No acute or reversible finding.)   --Depakote, Ativan, and Zyprexa stopped -- ACTT referral pending -- Continue  Aricept 27m daily for memory issues -- Interim guardianship in place with mother and waiting on SSDI and medicaid for group home referral -- working on prompts to allow staff to assist with ADLs and hygiene but she is resistant  3. Medical Issues Being Addressed:   Tobacco Use Disorder  -- Continue Nicotine patch 178mQ day    Hyperammonemia -resolved --Ammonia level 30 on 10/27/21 and 25 on 8/4 --Depakote was stopped     Elevated creatinine  --Creatinine of 1.42 on 8/16 - on repeat today 1.19 with GFR 54 -- Continue to encourage po fluids and trend     Seasonal allergies -Continue Flonase PRN per guardian approval  Constipation - improved -- Continue colace 10041md dosing  -- PRN Miralax for severe constipation  Nausea/Vomiting -resolved -- SLP saw for swallow assessment and feels is cognitively based dysphagia - no limitations continue regular diet and thin liquid -- Continue to monitor during meals meals to help with portion control   Tachycardia  -- EKG on 8/6 with QTC-417. Continuing Inderal 58m20md - checking orthostatic vitals Q day  Hypokalemia - resolved - Received K+ 20 meq bid and repeat BMP 8/18 shows K+ 4.0  4. Discharge Planning:   -- Social work and case management to assist with discharge planning and identification of hospital follow-up needs prior to discharge  -- Discharge Concerns: Need to establish a safety plan; Medication compliance and effectiveness;  safe housing   -- Discharge Goals: ALF or group home with outpatient referrals for mental health follow-up including medication management/psychotherapy  Labs On Admission- CMP: WNL except Cr: 1.1,  GFR: 60,  CBC: WNL,  UDS: Neg,  Troponin: 3,  EtOH: Neg,  beta hCG: 6.4,  Resp Panel: Neg 5/29- Urine Preg: Neg,  A1c: 5.3,  TSH: 1.719,  Lipid Panel: WNL except LDL: 101 5/24- BMP: WNL except Cr: 1.01,  Ca: 8.4,  hCG: 5,  Urine Preg: Neg 5/28- Ammonia: 32,  CBC: WNL,  CMP: WNL except  Cr: 1.08,  Ca: 8.6,  Depakote lvl: 20 6/3- Depakote lvl: 64, Hep Function: WNL,  CBC: WNL except Hem: 11.9 6/7- Depakote lvl: 75 6/18- UA: Neg,  CBC: WNL,  Depakote lvl: 136,  Ammonia: 99,  BMP: WNL except BUN: 22,  Cr: 1.19,  Ca: 8.6,  GFR: 54,  Hep Function: WNL except Albu: 3.3,  Indirect Bili: 0.2 6/20- ANA: Neg,  Ammonia: 48,  Ceruloplasmin: 24.2,  CMP: WNL except BUN: 23,  Cr: 1.35,  Ca: 8.3,  Albu: 3.4  HIV:Neg,  RPR: Neg,  Sed Rate: 6,  Depakote lvl: 25,  Heavy Metal: Neg 6/23- Ammonia: 46,  BMP: WNL except Cr: 1.2,  Ca: 8.3,  GFR: 54 6/24- UA: WNL except Bacteria: rare 6/30- CMP:  WNL except Cr: 1.04,  Ca: 8.6,  CBC: WNL,  Ammonia: 41, EKG: NSR with Qtc: 399 7/8 - CMP WNL other than CR 1.33 and GFR 48; Ammonia 30 7/18 - Cr of 1.22, eGFR of 53 7/23 - EKG with Qtc of 422 7/31 - creatinine 1.08 8/1 - creatinine 1.24 8/3 - COVID negative 8/4 - CBC WNL, NH3 25, CMP WNL other than creatinine 1.05 and glucose 122, lipase WNL 8/6 - CXR negative and COVID negative 8/16 - Creatinine 1.42 with GFR 44, glucose 146 and K+ 3.4 otherwise BMP WNL 8/18 - Creatinine 1.19 with GFR 54, glucose 110, and CL 112 and K+ 4.0  DoriNicholes Rough 12/18/2021, 1:53 PM  Patient ID: AngeManuella Ghazi  female   DOB: 1965/05/30, 56 y.o.   MRN: [id number] Patient ID: MALEY VENEZIA, female   DOB: December 15, 1965, 56 y.o.   MRN: [id number]

## 2021-12-19 MED ORDER — HALOPERIDOL 2 MG PO TABS
2.0000 mg | ORAL_TABLET | Freq: Every day | ORAL | Status: DC
  Administered 2021-12-19 – 2021-12-23 (×5): 2 mg via ORAL
  Filled 2021-12-19 (×7): qty 1

## 2021-12-19 MED ORDER — HALOPERIDOL 1 MG PO TABS
1.0000 mg | ORAL_TABLET | Freq: Every day | ORAL | Status: DC
  Administered 2021-12-20 – 2021-12-24 (×5): 1 mg via ORAL
  Filled 2021-12-19 (×6): qty 1

## 2021-12-19 NOTE — Progress Notes (Signed)
[facility] MD Progress Note  12/19/2021 2:43 PM Natalie Brown  MRN:  [id number]  Reason for Admission:  Natalie Brown is a 56 y.o. female  who was initially admitted for inpatient psychiatric hospitalization on 09/06/2021 for management of mania, disorganized behaviors/thinking, and delusions. The patient is currently on Hospital Day 104.   Chart Review from last 24 hours:  V/S for the past 24 hrs have been WNL. She has taken all medications as scheduled for the past 24hrs. She has not required any anti anxiety or agitation protocol medications for the past 24 hrs. Pt has attended some unit group sessions over the past 24 hrs, and has been noted to be disorganized and confused, and lacking in judgment and insight.   Information Obtained Today During Patient Interview: Pt is seen today in her room on the 500 hall sitting in bed. Pt with flat affect and depressed mood, attention to personal hygiene and grooming is fair, eye contact is good, speech is clear & coherent. Thought contents are organized and logical, and pt currently denies SI/HI/AVH or paranoia. There is no evidence of delusional thoughts.  Pt is oriented to person, states that she is at [facility], and knows today's date and states that it is 12/19/21. Pt did take a shower today and tended to her personal hygiene including clothing change.  During interaction, pt seemed dazed, and stated: "I woke up to this", while pulling at the covers on her bed, and seeming not to know what to do with them. She tried to get back into bed, but very slowly, and asked if it is night time yet. [name] definitely confused regarding her surroundings, and seems not to know what to do with items around her. She however reports a good sleep quality last night and reports a good appetite. We will decrease her Haldol to 1 mg in the mornings, and leave 2 mg nightly to see if this helps with the day time tiredness. We will continue all other medications as  below.  Principal Problem: Bipolar affective disorder, current episode manic with psychotic symptoms ([place]) Diagnosis: Principal Problem:   Bipolar affective disorder, current episode manic with psychotic symptoms ([place]) Active Problems:   Major neurocognitive disorder ([place])  Past Psychiatric History: see H&P  Past Medical History:  Past Medical History:  Diagnosis Date   Bipolar 1 disorder with moderate mania ([place]) 12/30/2013   Hypertension    Family History: see H&P  Family Psychiatric  History: see H&P  Social History:  Homeless; mother is interim guardian   Sleep: Good - 6.5 hours  Appetite:  Good  Current Medications: Current Facility-Administered Medications  Medication Dose Route Frequency Provider Last Rate Last Admin   acetaminophen (TYLENOL) tablet 650 mg  650 mg Oral Q6H PRN Briant Cedar, MD   650 mg at 11/14/21 0759   alum & mag hydroxide-simeth (MAALOX/MYLANTA) 200-200-20 MG/5ML suspension 30 mL  30 mL Oral Q4H PRN Massengill, [name], MD       atropine 1 % ophthalmic solution 1 drop  1 drop Sublingual Q8H PRN Attiah, Nadir, MD   1 drop at 12/18/21 2135   benztropine (COGENTIN) tablet 0.5 mg  0.5 mg Oral BID Nelda Marseille, Amy E, MD   0.5 mg at 12/19/21 0758   docusate sodium (COLACE) capsule 100 mg  100 mg Oral Daily Nelda Marseille, Amy E, MD   100 mg at 12/19/21 0758   donepezil (ARICEPT) tablet 5 mg  5 mg Oral Daily Nelda Marseille, Amy  E, MD   5 mg at 12/19/21 0758   feeding supplement (KATE FARMS STANDARD 1.4) liquid 325 mL  325 mL Oral BID BM Massengill, Nathan, MD   325 mL at 12/16/21 1404   fluticasone (FLONASE) 50 MCG/ACT nasal spray 1 spray  1 spray Each Nare Daily PRN Harlow Asa, MD   1 spray at 12/18/21 0941   [START ON 12/20/2021] haloperidol (HALDOL) tablet 1 mg  1 mg Oral Daily Belva Koziel, NP       haloperidol (HALDOL) tablet 2 mg  2 mg Oral QHS Estaban [place], NP       hydrocerin (EUCERIN) cream   Topical BID Janine Limbo, MD   1 Application  at 16/10/96 0759   hydrOXYzine (ATARAX) tablet 25 mg  25 mg Oral TID PRN Janine Limbo, MD   25 mg at 12/17/21 2027   LORazepam (ATIVAN) tablet 1 mg  1 mg Oral Q6H PRN Viann Fish E, MD   1 mg at 12/18/21 2133   magnesium hydroxide (MILK OF MAGNESIA) suspension 30 mL  30 mL Oral Daily PRN Massengill, [name], MD   30 mL at 11/14/21 0802   nicotine (NICODERM CQ - dosed in mg/24 hours) patch 14 mg  14 mg Transdermal Daily Nelda Marseille, Amy E, MD   14 mg at 12/19/21 0801   OLANZapine zydis (ZYPREXA) disintegrating tablet 5 mg  5 mg Oral Q8H PRN Viann Fish E, MD   5 mg at 10/14/21 1416   And   ziprasidone (GEODON) injection 20 mg  20 mg Intramuscular PRN Harlow Asa, MD       ondansetron (ZOFRAN) tablet 4 mg  4 mg Oral Q8H PRN Viann Fish E, MD   4 mg at 11/23/21 0454   polyethylene glycol (MIRALAX / GLYCOLAX) packet 17 g  17 g Oral Daily PRN Harlow Asa, MD       propranolol (INDERAL) tablet 10 mg  10 mg Oral BID Viann Fish E, MD   10 mg at 12/19/21 0981   Lab Results:  No results found for this or any previous visit (from the past 23 hour(s)).   Blood Alcohol level:  Lab Results  Component Value Date   ETH <10 19/14/7829   Metabolic Disorder Labs: Lab Results  Component Value Date   HGBA1C 5.3 09/08/2021   MPG 105.41 09/08/2021   No results found for: "PROLACTIN" Lab Results  Component Value Date   CHOL 173 09/08/2021   TRIG 110 09/08/2021   HDL 50 09/08/2021   CHOLHDL 3.5 09/08/2021   VLDL 22 09/08/2021   LDLCALC 101 (H) 09/08/2021   LDLCALC 102 (H) 06/21/2020   Physical Findings:  AIMS: Facial and Oral Movements Muscles of Facial Expression: None, normal Lips and Perioral Area: None, normal Jaw: None, normal Tongue: None, normal,Extremity Movements Upper (arms, wrists, hands, fingers): None, normal Lower (legs, knees, ankles, toes): None, normal, Trunk Movements Neck, shoulders, hips: None, normal, Overall Severity Severity of abnormal movements  (highest score from questions above): None, normal Incapacitation due to abnormal movements: None, normal Patient's awareness of abnormal movements (rate only patient's report): No Awareness, Dental Status Current problems with teeth and/or dentures?: No Does patient usually wear dentures?: No   Musculoskeletal: Strength & Muscle Tone: normal Gait & Station: normal Patient leans: N/A  Psychiatric Specialty Exam:  Presentation  General Appearance: unkempt appearing  - wearing hair bonnet, wearing same dirty clothes for last several days - face appears unwashed and mildly malodorous  Eye Contact:Fair -  glaring quality at times  Speech: speaks in short phrases to direct questions  Speech Volume:Normal  Mood and Affect  Mood:appears mildly anxious and confused  Affect: calm, polite, confused appearing  Thought Process  Thought Processes: concrete, confused at times  Orientation: Partially oriented to time place and situation  Thought Content: Denies SI HI or AVH, does not present paranoid or responding to stimuli. Hallucinations:Denied  Ideas of Reference:Denied  Suicidal Thoughts:Denied   Homicidal Thoughts:Denied   Sensorium  Memory:Poor  Judgment:Poor  Insight:Poor  Executive Functions  Concentration:Poor  Attention Span:Poor  Recall:Poor  Fund of Knowledge:Limited  Language:Fair  Psychomotor Activity  Psychomotor Activity:no restlessness, akathisia or tremors noted, AIMS 0  Assets  Assets:Resilience  Physical Exam Vitals and nursing note reviewed.  Constitutional:      Appearance: Normal appearance.  HENT:     Head: Normocephalic.     Mouth/Throat:     Comments: Increased drooling on exam Eyes:     Pupils: Pupils are equal, round, and reactive to light.  Pulmonary:     Effort: Pulmonary effort is normal.  Neurological:     General: No focal deficit present.     Mental Status: [name] alert.    Review of Systems  Constitutional:  Negative.   HENT: Negative.    Eyes: Negative.   Respiratory: Negative.  Negative for shortness of breath.   Cardiovascular: Negative.  Negative for chest pain and palpitations.  Gastrointestinal: Negative.  Negative for abdominal pain, constipation, diarrhea, nausea and vomiting.  Genitourinary: Negative.   Musculoskeletal: Negative.   Skin: Negative.   Neurological: Negative.  Negative for dizziness and headaches.  Psychiatric/Behavioral:  Positive for memory loss. Negative for depression, hallucinations, substance abuse and suicidal ideas. The patient is not nervous/anxious and does not have insomnia.    Blood pressure 133/85, pulse 90, temperature 97.8 F (36.6 C), temperature source Oral, resp. rate 20, height 5' 6"  (1.676 m), weight 72.6 kg, SpO2 100 %. Body mass index is 25.83 kg/m.  Treatment Plan Summary:  Diagnoses / Active Problems: Bipolar I MRE manic with psychotic features Major Neurocognitive d/o with behavioral disturbance  PLAN: Safety and Monitoring:  -- Voluntary admission (signed in by guardian for treatment on 11/19/21) to inpatient psychiatric unit for safety, stabilization and treatment  -- Daily contact with patient to assess and evaluate symptoms and progress in treatment  -- Patient's case to be discussed in multi-disciplinary team meeting  -- Observation Level : q15 minute checks  -- Vital signs:  q12 hours  -- Precautions: Safety 2. Psychiatric Diagnoses and Treatment:   Bipolar I MRE manic with psychotic features  Major neurocognitive disorder with behavioral disturbance (marked for age brain atrophy on CT head and MOCA 11/30) -Continue Aricept 5 mg daily for major neurocognitive disorder with plan to titrate to 10 mg on 9/4   -Continue Haldol and change daytime dose to 83m and leave 24mnightly - (Guardian aware and will continue present dose with hope of attempting to taper off in future if tolerated) - Continue Cogentin 0.69m61mid and monitor for  EPS -Continue atropine ophthalmologic gtts 1 drop q8 hour SL PRN drooling (guardian consents to med trial) -Continue Inderal 39m51md for help with anxiety and tachycardia (guardian consents to med trial) -InvLorayne Bendertenna 234 mg IM given on 09-30-21, 156mg37mgiven 10/12/20 - Invega 78 mg given on 7/21; Invega 78mg 82mn 8/18 with consent by guardian -- Zyprexa/Geodon/ativan agitation protocol PRN   --Additional psychosis labs (HIV- , RPR-, ANA-,  heavy metal WNL, ESR wnl, ceruloplasmin wnl and CT head shows Marked for age brain atrophy.  No acute or reversible finding.)   --Depakote, Ativan, and Zyprexa stopped -- ACTT referral pending -- Continue Aricept 75m daily for memory issues -- Interim guardianship in place with mother and waiting on SSDI and medicaid for group home referral -- working on prompts to allow staff to assist with ADLs and hygiene but she is resistant  3. Medical Issues Being Addressed:   Tobacco Use Disorder  -- Continue Nicotine patch 120mQ day    Hyperammonemia -resolved --Ammonia level 30 on 10/27/21 and 25 on 8/4 --Depakote was stopped     Elevated creatinine  --Creatinine of 1.42 on 8/16 - on repeat today 1.19 with GFR 54 -- Continue to encourage po fluids and trend     Seasonal allergies -Continue Flonase PRN per guardian approval  Constipation - improved -- Continue colace 10034md dosing  -- PRN Miralax for severe constipation  Nausea/Vomiting -resolved -- SLP saw for swallow assessment and feels is cognitively based dysphagia - no limitations continue regular diet and thin liquid -- Continue to monitor during meals meals to help with portion control   Tachycardia  -- EKG on 8/6 with QTC-417. Continuing Inderal 65m68md - checking orthostatic vitals Q day  Hypokalemia - resolved - Received K+ 20 meq bid and repeat BMP 8/18 shows K+ 4.0  4. Discharge Planning:   -- Social work and case management to assist with discharge planning and identification  of hospital follow-up needs prior to discharge  -- Discharge Concerns: Need to establish a safety plan; Medication compliance and effectiveness; safe housing   -- Discharge Goals: ALF or group home with outpatient referrals for mental health follow-up including medication management/psychotherapy  Labs On Admission- CMP: WNL except Cr: 1.1,  GFR: 60,  CBC: WNL,  UDS: Neg,  Troponin: 3,  EtOH: Neg,  beta hCG: 6.4,  Resp Panel: Neg 5/29- Urine Preg: Neg,  A1c: 5.3,  TSH: 1.719,  Lipid Panel: WNL except LDL: 101 5/24- BMP: WNL except Cr: 1.01,  Ca: 8.4,  hCG: 5,  Urine Preg: Neg 5/28- Ammonia: 32,  CBC: WNL,  CMP: WNL except  Cr: 1.08,  Ca: 8.6,  Depakote lvl: 20 6/3- Depakote lvl: 64, Hep Function: WNL,  CBC: WNL except Hem: 11.9 6/7- Depakote lvl: 75 6/18- UA: Neg,  CBC: WNL,  Depakote lvl: 136,  Ammonia: 99,  BMP: WNL except BUN: 22,  Cr: 1.19,  Ca: 8.6,  GFR: 54,  Hep Function: WNL except Albu: 3.3,  Indirect Bili: 0.2 6/20- ANA: Neg,  Ammonia: 48,  Ceruloplasmin: 24.2,  CMP: WNL except BUN: 23,  Cr: 1.35,  Ca: 8.3,  Albu: 3.4  HIV:Neg,  RPR: Neg,  Sed Rate: 6,  Depakote lvl: 25,  Heavy Metal: Neg 6/23- Ammonia: 46,  BMP: WNL except Cr: 1.2,  Ca: 8.3,  GFR: 54 6/24- UA: WNL except Bacteria: rare 6/30- CMP:  WNL except Cr: 1.04,  Ca: 8.6,  CBC: WNL,  Ammonia: 41, EKG: NSR with Qtc: 399 7/8 - CMP WNL other than CR 1.33 and GFR 48; Ammonia 30 7/18 - Cr of 1.22, eGFR of 53 7/23 - EKG with Qtc of 422 7/31 - creatinine 1.08 8/1 - creatinine 1.24 8/3 - COVID negative 8/4 - CBC WNL, NH3 25, CMP WNL other than creatinine 1.05 and glucose 122, lipase WNL 8/6 - CXR negative and COVID negative 8/16 - Creatinine 1.42 with GFR 44, glucose 146  and K+ 3.4 otherwise BMP WNL 8/18 - Creatinine 1.19 with GFR 54, glucose 110, and CL 112 and K+ 4.0  Nicholes Rough, NP 12/19/2021, 2:43 PM  Patient ID: JASMINE MCBETH, female   DOB: 14-Dec-1965, 56 y.o.   MRN: [id number] Patient ID: COLLEENE SWARTHOUT, female    DOB: 12/06/1965, 56 y.o.   MRN: [id number] Patient ID: KAYAH HECKER, female   DOB: [birth year], 56 y.o.   MRN: [id number]

## 2021-12-19 NOTE — Progress Notes (Signed)
Adult Psychoeducational Group Note  Date:  12/19/2021 Time:  9:00 PM  Group Topic/Focus:  Wrap-Up Group:   The focus of this group is to help patients review their daily goal of treatment and discuss progress on daily workbooks.  Participation Level:  Minimal  Participation Quality:  Redirectable  Affect:  Appropriate  Cognitive:  Disorganized and Confused  Insight: Lacking  Engagement in Group:  Lacking  Modes of Intervention:  Discussion  Additional Comments:   Pt attended and participated in the Wrap-Up group. After 3 prompts Pt was able to rate her day/mood 6/10. Pt was unsure if she had any goals for today. Pt states she was glad to see her mom today. Pt was redirected 2x while peers shared. Pt stood up, walked back and forth in confusion then left group.  Natalie Brown 12/19/2021, 9:00 PM

## 2021-12-19 NOTE — Progress Notes (Signed)
Pt observed with increase confusion, excessive worrying, demanding & intrusive and behaviors this shift. Pt unaware of where she was this morning, what she ate, calling peers to leave unit breakfast to cafeteria.   Required multiple verbal redirections to comply with unit routines. Pt became irritated when multiple demands to go to the cafeteria for breakfast was not met as it was not meal time. Accused staff of stealing her jacket "I know what I did. I gave you my jacket, now give it to me". Had to be redirected multiple times from reaching into medication bin. Safety checks maintained at Q 15 minutes intervals without incident. Emotional support, encouragement and reassurance provided to pt this shift. Pt tolerated meals well but declined nutritional supplement.

## 2021-12-19 NOTE — Progress Notes (Signed)
   12/19/21 0515  Sleep  Number of Hours 7.5

## 2021-12-19 NOTE — Progress Notes (Signed)
   12/19/21 2100  Psych Admission Type (Psych Patients Only)  Admission Status Involuntary  Psychosocial Assessment  Patient Complaints Confusion  Eye Contact Brief  Facial Expression Flat  Affect Appropriate to circumstance  Speech Soft  Interaction Cautious  Motor Activity Slow  Appearance/Hygiene Disheveled  Behavior Characteristics Cooperative  Mood Preoccupied  Aggressive Behavior  Effect No apparent injury  Thought Process  Coherency Disorganized  Content WDL  Delusions None reported or observed  Perception WDL  Hallucination None reported or observed  Judgment Impaired  Confusion None  Danger to Self  Current suicidal ideation? Denies  Danger to Others  Danger to Others None reported or observed  Danger to Others Abnormal  Harmful Behavior to others No threats or harm toward other people

## 2021-12-19 NOTE — Group Note (Signed)
Recreation Therapy Group Note   Group Topic:Team Building  Group Date: 12/19/2021 Start Time: 1000 End Time: 1045 Facilitators: Caroll Rancher, LRT,CTRS Location: 500 Hall Dayroom   Goal Area(s) Addresses:  Patient will effectively work with peer towards shared goal.  Patient will identify skills used to make activity successful.  Patient will identify how skills used during activity can be used to reach post d/c goals.   Group Description: Landing Pad. In teams of 3-5, patients were given 12 plastic drinking straws and an equal length of masking tape. Using the materials provided, patients were asked to build a landing pad to catch a golf ball dropped from approximately 5 feet in the air. All materials were required to be used by the team in their design. LRT facilitated post-activity discussion.   Affect/Mood: Appropriate   Participation Level: Minimal   Participation Quality: Moderate Cues   Behavior: Confused   Speech/Thought Process: Disorganized   Insight: Lacking   Judgement: Lacking    Modes of Intervention: STEM Activity   Patient Response to Interventions:  Confused   Education Outcome:  Acknowledges education and In group clarification offered    Clinical Observations/Individualized Feedback: Pt came late to group.  Pt was unable to focus or understand the instructions of the activity. Pt eventually left group and did not return.     Plan: Continue to engage patient in RT group sessions 2-3x/week.   Caroll Rancher, LRT,CTRS 12/19/2021 1:20 PM

## 2021-12-19 NOTE — Group Note (Signed)
  [facility] LCSW Group Therapy Note  Date/Time:  12/19/2021 10:00AM-11:00AM  Type of Therapy and Topic:  Group Therapy:  Self-Care Wheel  Participation Level:  Did Not Attend   Description of Group This process group involved patients discussing the importance of self-care in different areas of life (professional, personal, emotional, psychological, spiritual, and physical) in order to achieve healthy life balance.  The group talked about what self-care in each of those areas would constitute and then specifically listed how they want to provide themselves with improved self-care.  Therapeutic Goals Patient will learn how to break self-care down into various areas of life Patient will participate in generating ideas about healthy self-care options in each category Patients will be supportive of one another and receive support from others Patient will identify one healthy self-care activity to add to his/her life   Summary of Patient Progress:  Did not Attend Therapeutic Modalities Processing Psychoeducation   [name] Ossa S. Devaun Hernandez, LCSW 12/19/2021 2:00 PM

## 2021-12-20 LAB — CBC WITH DIFFERENTIAL/PLATELET
Abs Immature Granulocytes: 0.01 10*3/uL (ref 0.00–0.07)
Basophils Absolute: 0 10*3/uL (ref 0.0–0.1)
Basophils Relative: 1 %
Eosinophils Absolute: 0.2 10*3/uL (ref 0.0–0.5)
Eosinophils Relative: 3 %
HCT: 42.1 % (ref 36.0–46.0)
Hemoglobin: 13.9 g/dL (ref 12.0–15.0)
Immature Granulocytes: 0 %
Lymphocytes Relative: 33 %
Lymphs Abs: 2.1 10*3/uL (ref 0.7–4.0)
MCH: 29.8 pg (ref 26.0–34.0)
MCHC: 33 g/dL (ref 30.0–36.0)
MCV: 90.1 fL (ref 80.0–100.0)
Monocytes Absolute: 0.4 10*3/uL (ref 0.1–1.0)
Monocytes Relative: 6 %
Neutro Abs: 3.6 10*3/uL (ref 1.7–7.7)
Neutrophils Relative %: 57 %
Platelets: 302 10*3/uL (ref 150–400)
RBC: 4.67 MIL/uL (ref 3.87–5.11)
RDW: 13.2 % (ref 11.5–15.5)
WBC: 6.2 10*3/uL (ref 4.0–10.5)
nRBC: 0 % (ref 0.0–0.2)

## 2021-12-20 LAB — COMPREHENSIVE METABOLIC PANEL
ALT: 14 U/L (ref 0–44)
AST: 20 U/L (ref 15–41)
Albumin: 3.6 g/dL (ref 3.5–5.0)
Alkaline Phosphatase: 58 U/L (ref 38–126)
Anion gap: 5 (ref 5–15)
BUN: 14 mg/dL (ref 6–20)
CO2: 26 mmol/L (ref 22–32)
Calcium: 9.1 mg/dL (ref 8.9–10.3)
Chloride: 110 mmol/L (ref 98–111)
Creatinine, Ser: 1.19 mg/dL — ABNORMAL HIGH (ref 0.44–1.00)
GFR, Estimated: 54 mL/min — ABNORMAL LOW (ref >60)
Glucose, Bld: 92 mg/dL (ref 70–99)
Potassium: 4.3 mmol/L (ref 3.5–5.1)
Sodium: 141 mmol/L (ref 135–145)
Total Bilirubin: 0.6 mg/dL (ref 0.3–1.2)
Total Protein: 7.2 g/dL (ref 6.5–8.1)

## 2021-12-20 NOTE — Progress Notes (Signed)
Urine cup given to patient to collect urine at 1430, patient verbalized she would give a specimen later, but has not given [place] one yet.

## 2021-12-20 NOTE — Plan of Care (Signed)
  Problem: Education: Goal: Emotional status will improve Outcome: Progressing Goal: Mental status will improve Outcome: Not Progressing Goal: Verbalization of understanding the information provided will improve Outcome: Not Progressing   Problem: Activity: Goal: Interest or engagement in activities will improve Outcome: Progressing Goal: Sleeping patterns will improve Outcome: Progressing   Problem: Coping: Goal: Ability to verbalize frustrations and anger appropriately will improve Outcome: Progressing Goal: Ability to demonstrate self-control will improve Outcome: Progressing

## 2021-12-20 NOTE — Progress Notes (Signed)
[facility] MD Progress Note  12/20/2021 10:16 AM Natalie Brown  MRN:  [id number]  Reason for Admission:  Natalie Brown is a 56 y.o. female  who was initially admitted for inpatient psychiatric hospitalization on 09/06/2021 for management of mania, disorganized behaviors/thinking, and delusions. The patient is currently on Hospital Day 105.   Chart Review from last 24 hours:  V/S for the past 24 hrs have been WNL. She has taken all medications as scheduled for the past 24hrs. She has not required any anti anxiety or agitation protocol medications for the past 24 hrs. Pt has attended some unit group sessions over the past 24 hrs, and has been noted to be disorganized and confused, and lacking in judgment and insight.   Information Obtained Today During Patient Interview: Pt is seen today in her room on the 500 hall laying in bed. Pt with flat affect and depressed mood, attention to personal hygiene and grooming is fair, eye contact is good, speech is clear & coherent. Thought contents are organized and logical, and pt currently denies SI/HI/AVH or paranoia. There is no evidence of delusional thoughts.  Pt is oriented to person, states that she is at [facility], and knows today's date and states that it is 12/20/21. Pt has been refusing to shower at times.  During interaction, pt seemed dazed, and stated: "You woke me up". She however reports a good sleep quality last night and reports a good appetite. We will decrease her Haldol to 1 mg in the mornings, and leave 2 mg nightly to see if this helps with the day time tiredness. We will continue all other medications as below.  Principal Problem: Bipolar affective disorder, current episode manic with psychotic symptoms ([place]) Diagnosis: Principal Problem:   Bipolar affective disorder, current episode manic with psychotic symptoms ([place]) Active Problems:   Major neurocognitive disorder ([place])  Past Psychiatric History: see H&P  Past Medical  History:  Past Medical History:  Diagnosis Date   Bipolar 1 disorder with moderate mania ([place]) 12/30/2013   Hypertension    Family History: see H&P  Family Psychiatric  History: see H&P  Social History:  Homeless; mother is interim guardian   Sleep: Good - 7.5 hours  Appetite:  Good  Current Medications: Current Facility-Administered Medications  Medication Dose Route Frequency Provider Last Rate Last Admin   acetaminophen (TYLENOL) tablet 650 mg  650 mg Oral Q6H PRN Briant Cedar, MD   650 mg at 11/14/21 0759   alum & mag hydroxide-simeth (MAALOX/MYLANTA) 200-200-20 MG/5ML suspension 30 mL  30 mL Oral Q4H PRN Massengill, [name], MD       atropine 1 % ophthalmic solution 1 drop  1 drop Sublingual Q8H PRN Winfred Leeds, Nadir, MD   1 drop at 12/18/21 2135   benztropine (COGENTIN) tablet 0.5 mg  0.5 mg Oral BID Nelda Marseille, Amy E, MD   0.5 mg at 12/20/21 0803   docusate sodium (COLACE) capsule 100 mg  100 mg Oral Daily Nelda Marseille, Amy E, MD   100 mg at 12/20/21 0803   donepezil (ARICEPT) tablet 5 mg  5 mg Oral Daily Nelda Marseille, Amy E, MD   5 mg at 12/20/21 0803   feeding supplement (KATE FARMS STANDARD 1.4) liquid 325 mL  325 mL Oral BID BM Massengill, Nathan, MD   325 mL at 12/16/21 1404   fluticasone (FLONASE) 50 MCG/ACT nasal spray 1 spray  1 spray Each Nare Daily PRN Harlow Asa, MD   1 spray at 12/18/21  0941   haloperidol (HALDOL) tablet 1 mg  1 mg Oral Daily Nkwenti, Doris, NP   1 mg at 12/20/21 9629   haloperidol (HALDOL) tablet 2 mg  2 mg Oral QHS Nkwenti, Doris, NP   2 mg at 12/19/21 2042   hydrocerin (EUCERIN) cream   Topical BID Massengill, [name], MD   1 Application at 52/84/13 1718   hydrOXYzine (ATARAX) tablet 25 mg  25 mg Oral TID PRN Janine Limbo, MD   25 mg at 12/17/21 2027   LORazepam (ATIVAN) tablet 1 mg  1 mg Oral Q6H PRN Viann Fish E, MD   1 mg at 12/19/21 2041   magnesium hydroxide (MILK OF MAGNESIA) suspension 30 mL  30 mL Oral Daily PRN Massengill,  [name], MD   30 mL at 11/14/21 0802   nicotine (NICODERM CQ - dosed in mg/24 hours) patch 14 mg  14 mg Transdermal Daily Nelda Marseille, Amy E, MD   14 mg at 12/20/21 0806   OLANZapine zydis (ZYPREXA) disintegrating tablet 5 mg  5 mg Oral Q8H PRN Harlow Asa, MD   5 mg at 10/14/21 1416   And   ziprasidone (GEODON) injection 20 mg  20 mg Intramuscular PRN Harlow Asa, MD       ondansetron [facility]) tablet 4 mg  4 mg Oral Q8H PRN Harlow Asa, MD   4 mg at 11/23/21 2440   polyethylene glycol (MIRALAX / GLYCOLAX) packet 17 g  17 g Oral Daily PRN Harlow Asa, MD       propranolol (INDERAL) tablet 10 mg  10 mg Oral BID Harlow Asa, MD   10 mg at 12/20/21 1027   Lab Results:  No results found for this or any previous visit (from the past 7 hour(s)).   Blood Alcohol level:  Lab Results  Component Value Date   ETH <10 25/36/6440   Metabolic Disorder Labs: Lab Results  Component Value Date   HGBA1C 5.3 09/08/2021   MPG 105.41 09/08/2021   No results found for: "PROLACTIN" Lab Results  Component Value Date   CHOL 173 09/08/2021   TRIG 110 09/08/2021   HDL 50 09/08/2021   CHOLHDL 3.5 09/08/2021   VLDL 22 09/08/2021   LDLCALC 101 (H) 09/08/2021   LDLCALC 102 (H) 06/21/2020   Physical Findings:  AIMS: Facial and Oral Movements Muscles of Facial Expression: None, normal Lips and Perioral Area: None, normal Jaw: None, normal Tongue: None, normal,Extremity Movements Upper (arms, wrists, hands, fingers): None, normal Lower (legs, knees, ankles, toes): None, normal, Trunk Movements Neck, shoulders, hips: None, normal, Overall Severity Severity of abnormal movements (highest score from questions above): None, normal Incapacitation due to abnormal movements: None, normal Patient's awareness of abnormal movements (rate only patient's report): No Awareness, Dental Status Current problems with teeth and/or dentures?: No Does patient usually wear dentures?: No    Musculoskeletal: Strength & Muscle Tone: normal Gait & Station: normal Patient leans: N/A  Psychiatric Specialty Exam:  Presentation  General Appearance: unkempt appearing  - wearing hair bonnet, wearing same dirty clothes for last several days - face appears unwashed and mildly malodorous  Eye Contact:Fair - glaring quality at times  Speech: speaks in short phrases to direct questions  Speech Volume:Normal  Mood and Affect  Mood:appears mildly anxious and confused  Affect: calm, polite, confused appearing  Thought Process  Thought Processes: concrete, confused at times  Orientation: Partially oriented to time place and situation  Thought Content: Denies SI HI or AVH,  does not present paranoid or responding to stimuli. Hallucinations:Denied  Ideas of Reference:Denied  Suicidal Thoughts:Denied   Homicidal Thoughts:Denied   Sensorium  Memory:Poor  Judgment:Poor  Insight:Poor  Executive Functions  Concentration:Poor  Attention Span:Poor  Recall:Poor  Fund of Knowledge:Limited  Language:Fair  Psychomotor Activity  Psychomotor Activity:no restlessness, akathisia or tremors noted, AIMS 0  Assets  Assets:Resilience  Physical Exam Vitals and nursing note reviewed.  Constitutional:      Appearance: Normal appearance.  HENT:     Head: Normocephalic.     Mouth/Throat:     Comments: Increased drooling on exam Eyes:     Pupils: Pupils are equal, round, and reactive to light.  Pulmonary:     Effort: Pulmonary effort is normal.  Neurological:     General: No focal deficit present.     Mental Status: [name] alert.    Review of Systems  Constitutional: Negative.   HENT: Negative.    Eyes: Negative.   Respiratory: Negative.  Negative for shortness of breath.   Cardiovascular: Negative.  Negative for chest pain and palpitations.  Gastrointestinal: Negative.  Negative for abdominal pain, constipation, diarrhea, nausea and vomiting.  Genitourinary:  Negative.   Musculoskeletal: Negative.   Skin: Negative.   Neurological: Negative.  Negative for dizziness and headaches.  Psychiatric/Behavioral:  Positive for memory loss. Negative for depression, hallucinations, substance abuse and suicidal ideas. The patient is not nervous/anxious and does not have insomnia.    Blood pressure 118/83, pulse (!) 111, temperature (!) 97.3 F (36.3 C), temperature source Oral, resp. rate 16, height 5' 6"  (1.676 m), weight 72.6 kg, SpO2 99 %. Body mass index is 25.83 kg/m.  Treatment Plan Summary:  Diagnoses / Active Problems: Bipolar I MRE manic with psychotic features Major Neurocognitive d/o with behavioral disturbance  PLAN: Safety and Monitoring:  -- Voluntary admission (signed in by guardian for treatment on 11/19/21) to inpatient psychiatric unit for safety, stabilization and treatment  -- Daily contact with patient to assess and evaluate symptoms and progress in treatment  -- Patient's case to be discussed in multi-disciplinary team meeting  -- Observation Level : q15 minute checks  -- Vital signs:  q12 hours  -- Precautions: Safety 2. Psychiatric Diagnoses and Treatment:   Bipolar I MRE manic with psychotic features  Major neurocognitive disorder with behavioral disturbance (marked for age brain atrophy on CT head and MOCA 11/30) -Continue Aricept 5 mg daily for major neurocognitive disorder with plan to titrate to 10 mg on 9/4   -Continue Haldol and change daytime dose to 54m and leave 268mnightly - (Guardian aware and will continue present dose with hope of attempting to taper off in future if tolerated) - Continue Cogentin 0.41m43mid and monitor for EPS -Continue atropine ophthalmologic gtts 1 drop q8 hour SL PRN drooling (guardian consents to med trial) -Continue Inderal 18m63md for help with anxiety and tachycardia (guardian consents to med trial) -InvLorayne Bendertenna 234 mg IM given on 09-30-21, 156mg42mgiven 10/12/20 - Invega 78 mg given on  7/21; Invega 78mg 54mn 8/18 with consent by guardian -- Zyprexa/Geodon/ativan agitation protocol PRN   --Additional psychosis labs (HIV- , RPR-, ANA-, heavy metal WNL, ESR wnl, ceruloplasmin wnl and CT head shows Marked for age brain atrophy.  No acute or reversible finding.)   --Depakote, Ativan, and Zyprexa stopped -- ACTT referral pending -- Continue Aricept 41mg da841m for memory issues -- Interim guardianship in place with mother and waiting on SSDI and medicaid for group home  referral -- working on prompts to allow staff to assist with ADLs and hygiene but she is resistant  3. Medical Issues Being Addressed:   Tobacco Use Disorder  -- Continue Nicotine patch 56m Q day    Hyperammonemia -resolved --Ammonia level 30 on 10/27/21 and 25 on 8/4 --Depakote was stopped     Elevated creatinine  --Creatinine of 1.42 on 8/16 - on repeat today 1.19 with GFR 54 -- Continue to encourage po fluids and trend     Seasonal allergies -Continue Flonase PRN per guardian approval  Constipation - improved -- Continue colace 1032mqd dosing  -- PRN Miralax for severe constipation  Nausea/Vomiting -resolved -- SLP saw for swallow assessment and feels is cognitively based dysphagia - no limitations continue regular diet and thin liquid -- Continue to monitor during meals meals to help with portion control   Tachycardia  -- EKG on 8/6 with QTC-417. Continuing Inderal 103mid - checking orthostatic vitals Q day  Hypokalemia - resolved - Received K+ 20 meq bid and repeat BMP 8/18 shows K+ 4.0  4. Discharge Planning:   -- Social work and case management to assist with discharge planning and identification of hospital follow-up needs prior to discharge  -- Discharge Concerns: Need to establish a safety plan; Medication compliance and effectiveness; safe housing   -- Discharge Goals: ALF or group home with outpatient referrals for mental health follow-up including medication  management/psychotherapy  Labs On Admission- CMP: WNL except Cr: 1.1,  GFR: 60,  CBC: WNL,  UDS: Neg,  Troponin: 3,  EtOH: Neg,  beta hCG: 6.4,  Resp Panel: Neg 5/29- Urine Preg: Neg,  A1c: 5.3,  TSH: 1.719,  Lipid Panel: WNL except LDL: 101 5/24- BMP: WNL except Cr: 1.01,  Ca: 8.4,  hCG: 5,  Urine Preg: Neg 5/28- Ammonia: 32,  CBC: WNL,  CMP: WNL except  Cr: 1.08,  Ca: 8.6,  Depakote lvl: 20 6/3- Depakote lvl: 64, Hep Function: WNL,  CBC: WNL except Hem: 11.9 6/7- Depakote lvl: 75 6/18- UA: Neg,  CBC: WNL,  Depakote lvl: 136,  Ammonia: 99,  BMP: WNL except BUN: 22,  Cr: 1.19,  Ca: 8.6,  GFR: 54,  Hep Function: WNL except Albu: 3.3,  Indirect Bili: 0.2 6/20- ANA: Neg,  Ammonia: 48,  Ceruloplasmin: 24.2,  CMP: WNL except BUN: 23,  Cr: 1.35,  Ca: 8.3,  Albu: 3.4  HIV:Neg,  RPR: Neg,  Sed Rate: 6,  Depakote lvl: 25,  Heavy Metal: Neg 6/23- Ammonia: 46,  BMP: WNL except Cr: 1.2,  Ca: 8.3,  GFR: 54 6/24- UA: WNL except Bacteria: rare 6/30- CMP:  WNL except Cr: 1.04,  Ca: 8.6,  CBC: WNL,  Ammonia: 41, EKG: NSR with Qtc: 399 7/8 - CMP WNL other than CR 1.33 and GFR 48; Ammonia 30 7/18 - Cr of 1.22, eGFR of 53 7/23 - EKG with Qtc of 422 7/31 - creatinine 1.08 8/1 - creatinine 1.24 8/3 - COVID negative 8/4 - CBC WNL, NH3 25, CMP WNL other than creatinine 1.05 and glucose 122, lipase WNL 8/6 - CXR negative and COVID negative 8/16 - Creatinine 1.42 with GFR 44, glucose 146 and K+ 3.4 otherwise BMP WNL 8/18 - Creatinine 1.19 with GFR 54, glucose 110, and CL 112 and K+ 4.0 8/31- will order CMP/CBC/UA, due to concerns about confusion  SARDereck LeepD 12/20/2021, 10:16 AM

## 2021-12-20 NOTE — [facility] Group Notes (Signed)
Adult Psychoeducational Group Note  Date:  12/20/2021 Time:  9:10 PM  Group Topic/Focus:  Wrap-Up Group:   The focus of this group is to help patients review their daily goal of treatment and discuss progress on daily workbooks.  Participation Level:  Minimal  Participation Quality:  Appropriate  Affect:  Appropriate  Cognitive:  Alert  Insight: Improving  Engagement in Group:  Engaged  Modes of Intervention:  Discussion and Education  Additional Comments:  Pt attended and participated in wrap up group this evening and rated their day a 6/10. Pt states that they went to groups and went outside with their peers. Pt stated that they saw the Dr, but they have no new news about their discharge date. Pt has no complaints to report at this time.   Chrisandra Netters 12/20/2021, 9:10 PM

## 2021-12-20 NOTE — [facility] Group Notes (Signed)
Adult Psychoeducational Group Note  Date:  12/20/2021 Time:  8:58 AM  Group Topic/Focus:  Goals Group:   The focus of this group is to help patients establish daily goals to achieve during treatment and discuss how the patient can incorporate goal setting into their daily lives to aide in recovery.  Participation Level:  Minimal  Participation Quality:  Appropriate  Affect:  Appropriate  Cognitive:  Disorganized  Insight: Lacking  Engagement in Group:  Lacking  Modes of Intervention:  Clarification  Donell Beers 12/20/2021, 8:58 AM

## 2021-12-20 NOTE — B[facility] Counselor (Signed)
CSW spoke with mother and provided updates around disability paperwork.  Mother requested family meeting with doctor to go over medications.  CSW notified provider and agreed to meet with patient mother Friday at 2pm to go over medication regimen    Nuri Larmer, LCSW, LCAS Clincal Social Worker  [facility]

## 2021-12-20 NOTE — Progress Notes (Signed)
Patient has been up most of the shift, took her meds as ordered and has been pacing up and down the hall. Confusion noted , patient denies SI/HI/AVH. Pt rates her depression, hopelessness and anxiety 6/10. Her daily goal pertains to discharge and going home. [name] easily redirectable and pleasant. Will continue q45min checks and provide verbal encouragement as needed.

## 2021-12-20 NOTE — Progress Notes (Signed)
   12/20/21 2145  Psych Admission Type (Psych Patients Only)  Admission Status Involuntary  Psychosocial Assessment  Patient Complaints Confusion  Eye Contact Brief  Facial Expression Flat  Affect Appropriate to circumstance  Speech Soft  Interaction Cautious  Motor Activity Slow  Appearance/Hygiene Disheveled  Behavior Characteristics Cooperative  Mood Preoccupied  Aggressive Behavior  Effect No apparent injury  Thought Process  Coherency Disorganized  Content WDL  Delusions None reported or observed  Perception WDL  Hallucination None reported or observed  Judgment Impaired  Confusion None  Danger to Self  Current suicidal ideation? Denies  Danger to Others  Danger to Others None reported or observed  Danger to Others Abnormal  Harmful Behavior to others No threats or harm toward other people

## 2021-12-20 NOTE — Progress Notes (Signed)
   12/20/21 0515  Sleep  Number of Hours 7.5

## 2021-12-21 ENCOUNTER — Encounter (HOSPITAL_COMMUNITY): Payer: Self-pay

## 2021-12-21 ENCOUNTER — Encounter (HOSPITAL_COMMUNITY): Payer: Self-pay | Admitting: Psychiatry

## 2021-12-21 LAB — URINALYSIS, ROUTINE W REFLEX MICROSCOPIC
Bilirubin Urine: NEGATIVE
Glucose, UA: NEGATIVE mg/dL
Hgb urine dipstick: NEGATIVE
Ketones, ur: NEGATIVE mg/dL
Leukocytes,Ua: NEGATIVE
Nitrite: NEGATIVE
Protein, ur: NEGATIVE mg/dL
Specific Gravity, Urine: 1.017 (ref 1.005–1.030)
pH: 6 (ref 5.0–8.0)

## 2021-12-21 NOTE — Progress Notes (Addendum)
[facility]/BM[facility] LCSW Progress Note   12/21/2021    2:59 PM  Natalie Brown   [id number]   Type of Contact and Topic: Family Meeting,   Participants:  [name], MSW, LCSWA, LCAS Bartholomew Crews, MD  Charna Busman, [place], MSW  [name], Mother/ LG  Time spent with family: 35 minutes   During meeting, physician informed mother/LG of medication changes and pharm-ed. CSW updated mother on disability status and process of finding group home. Mother was offered opportunity to ask clarifying questions.    Signed:  [name], MSW, LCSWA, LCAS 12/21/2021 2:59 PM

## 2021-12-21 NOTE — [facility] Group Notes (Signed)
Spirituality group facilitated by Chaplain Katy Tarry Blayney, BCC.   Group Description: Group focused on topic of hope. Patients participated in facilitated discussion around topic, connecting with one another around experiences and definitions for hope. Group members engaged with visual explorer photos, reflecting on what hope looks like for them today. Group engaged in discussion around how their definitions of hope are present today in hospital.   Modalities: Psycho-social ed, [place]lerian, Narrative, MI   Patient Progress: Did not attend.  

## 2021-12-21 NOTE — BH IP Treatment Plan (Signed)
Interdisciplinary Treatment and Diagnostic Plan Update  12/21/2021 Time of Session: 10:10am  Natalie Brown MRN: [id number]  Principal Diagnosis: Bipolar affective disorder, current episode manic with psychotic symptoms ([place])  Secondary Diagnoses: Principal Problem:   Bipolar affective disorder, current episode manic with psychotic symptoms ([place]) Active Problems:   Major neurocognitive disorder ([place])   Current Medications:  Current Facility-Administered Medications  Medication Dose Route Frequency Provider Last Rate Last Admin   acetaminophen (TYLENOL) tablet 650 mg  650 mg Oral Q6H PRN Briant Cedar, MD   650 mg at 11/14/21 0759   alum & mag hydroxide-simeth (MAALOX/MYLANTA) 200-200-20 MG/5ML suspension 30 mL  30 mL Oral Q4H PRN Massengill, [name], MD       atropine 1 % ophthalmic solution 1 drop  1 drop Sublingual Q8H PRN Winfred Leeds, Nadir, MD   1 drop at 12/18/21 2135   docusate sodium (COLACE) capsule 100 mg  100 mg Oral Daily Nelda Marseille, Amy E, MD   100 mg at 12/21/21 1013   feeding supplement (KATE FARMS STANDARD 1.4) liquid 325 mL  325 mL Oral BID BM Massengill, Nathan, MD   325 mL at 12/16/21 1404   fluticasone (FLONASE) 50 MCG/ACT nasal spray 1 spray  1 spray Each Nare Daily PRN Harlow Asa, MD   1 spray at 12/18/21 0941   haloperidol (HALDOL) tablet 1 mg  1 mg Oral Daily Nkwenti, Tamela Oddi, NP   1 mg at 12/21/21 1013   haloperidol (HALDOL) tablet 2 mg  2 mg Oral QHS Nkwenti, Doris, NP   2 mg at 12/20/21 2056   hydrocerin (EUCERIN) cream   Topical BID Janine Limbo, MD   Given at 12/21/21 1013   hydrOXYzine (ATARAX) tablet 25 mg  25 mg Oral TID PRN Janine Limbo, MD   25 mg at 12/17/21 2027   LORazepam (ATIVAN) tablet 1 mg  1 mg Oral Q6H PRN Viann Fish E, MD   1 mg at 12/20/21 2205   magnesium hydroxide (MILK OF MAGNESIA) suspension 30 mL  30 mL Oral Daily PRN Massengill, [name], MD   30 mL at 11/14/21 0802   nicotine (NICODERM CQ - dosed in mg/24 hours) patch  14 mg  14 mg Transdermal Daily Nelda Marseille, Amy E, MD   14 mg at 12/21/21 1018   OLANZapine zydis (ZYPREXA) disintegrating tablet 5 mg  5 mg Oral Q8H PRN Viann Fish E, MD   5 mg at 12/21/21 1027   And   ziprasidone (GEODON) injection 20 mg  20 mg Intramuscular PRN Harlow Asa, MD       ondansetron (ZOFRAN) tablet 4 mg  4 mg Oral Q8H PRN Viann Fish E, MD   4 mg at 11/23/21 [id number]   polyethylene glycol (MIRALAX / GLYCOLAX) packet 17 g  17 g Oral Daily PRN Harlow Asa, MD       propranolol (INDERAL) tablet 10 mg  10 mg Oral BID Nelda Marseille, Amy E, MD   10 mg at 12/21/21 1012   PTA Medications: Medications Prior to Admission  Medication Sig Dispense Refill Last Dose   CLARITIN 10 MG tablet Take 10 mg by mouth daily.      fluticasone (FLONASE) 50 MCG/ACT nasal spray Place 1 spray into both nostrils daily.      pseudoephedrine-guaifenesin (MUCINEX D) 60-600 MG 12 hr tablet Take 1 tablet by mouth every 12 (twelve) hours. 30 tablet 0     Patient Stressors: Health problems   Medication change or noncompliance    Patient  Strengths: Capable of independent living  Printmaker for treatment/growth  Supportive family/friends   Treatment Modalities: Medication Management, Group therapy, Case management,  1 to 1 session with clinician, Psychoeducation, Recreational therapy.   Physician Treatment Plan for Primary Diagnosis: Bipolar affective disorder, current episode manic with psychotic symptoms (H[facility]) Long Term Goal(s): Improvement in symptoms so as ready for discharge   Short Term Goals: Ability to identify and develop effective coping behaviors will improve Ability to maintain clinical measurements within normal limits will improve Compliance with prescribed medications will improve Ability to identify changes in lifestyle to reduce recurrence of condition will improve Ability to verbalize feelings will improve Ability to disclose and discuss suicidal  ideas Ability to demonstrate self-control will improve  Medication Management: Evaluate patient's response, side effects, and tolerance of medication regimen.  Therapeutic Interventions: 1 to 1 sessions, Unit Group sessions and Medication administration.  Evaluation of Outcomes: Progressing  Physician Treatment Plan for Secondary Diagnosis: Principal Problem:   Bipolar affective disorder, current episode manic with psychotic symptoms (HCC) Active Problems:   Major neurocognitive disorder (HCC)  Long Term Goal(s): Improvement in symptoms so as ready for discharge   Short Term Goals: Ability to identify and develop effective coping behaviors will improve Ability to maintain clinical measurements within normal limits will improve Compliance with prescribed medications will improve Ability to identify changes in lifestyle to reduce recurrence of condition will improve Ability to verbalize feelings will improve Ability to disclose and discuss suicidal ideas Ability to demonstrate self-control will improve     Medication Management: Evaluate patient's response, side effects, and tolerance of medication regimen.  Therapeutic Interventions: 1 to 1 sessions, Unit Group sessions and Medication administration.  Evaluation of Outcomes: Progressing   RN Treatment Plan for Primary Diagnosis: Bipolar affective disorder, current episode manic with psychotic symptoms (HCC) Long Term Goal(s): Knowledge of disease and therapeutic regimen to maintain health will improve  Short Term Goals: Ability to remain free from injury will improve, Ability to participate in decision making will improve, Ability to verbalize feelings will improve, Ability to disclose and discuss suicidal ideas, and Ability to identify and develop effective coping behaviors will improve  Medication Management: RN will administer medications as ordered by provider, will assess and evaluate patient's response and provide education  to patient for prescribed medication. RN will report any adverse and/or side effects to prescribing provider.  Therapeutic Interventions: 1 on 1 counseling sessions, Psychoeducation, Medication administration, Evaluate responses to treatment, Monitor vital signs and CBGs as ordered, Perform/monitor CIWA, COWS, AIMS and Fall Risk screenings as ordered, Perform wound care treatments as ordered.  Evaluation of Outcomes: Progressing   LCSW Treatment Plan for Primary Diagnosis: Bipolar affective disorder, current episode manic with psychotic symptoms (HCC) Long Term Goal(s): Safe transition to appropriate next level of care at discharge, Engage patient in therapeutic group addressing interpersonal concerns.  Short Term Goals: Engage patient in aftercare planning with referrals and resources, Increase social support, Increase emotional regulation, Facilitate acceptance of mental health diagnosis and concerns, Identify triggers associated with mental health/substance abuse issues, and Increase skills for wellness and recovery  Therapeutic Interventions: Assess for all discharge needs, 1 to 1 time with Social worker, Explore available resources and support systems, Assess for adequacy in community support network, Educate family and significant other(s) on suicide prevention, Complete Psychosocial Assessment, Interpersonal group therapy.  Evaluation of Outcomes: Progressing   Progress in Treatment: Attending groups: Yes. Participating in groups: Yes. Taking medication as prescribed: Yes. Toleration medication:  Yes. Family/Significant other contact made: Yes, individual(s) contacted:  [name], mother/legal guardian Patient understands diagnosis: Yes. Discussing patient identified problems/goals with staff: Yes. Medical problems stabilized or resolved: Yes. Denies suicidal/homicidal ideation: Yes. Issues/concerns per patient self-inventory: Yes. Other: none   New problem(s) identified: Yes,  Describe:  none   New Short Term/Long Term Goal(s): Patient to work towards elimination of symptoms of psychosis, medication management for mood stabilization; development of comprehensive mental wellness plan.   Patient Goals:  No additional goals identified at this time. Patient to continue to work towards original goals identified in initial treatment team meeting. CSW will remain available to patient should they voice additional treatment goals.    Discharge Plan or Barriers: No psychosocial barriers identified at this time, patient to return to place of residence when appropriate for discharge.    Reason for Continuation of Hospitalization: N/A, patient is adequate for discharge. Currently boarding due to no funds available for necessary level of care.    Estimated Length of Stay:    Last 3 [place] Suicide Severity Risk Score: Flowsheet Row Ad[facility] (Current) [facility] from [facility] in [facility] from [facility]/26/2020 in [facility]  C-SSRS RISK CATEGORY High Risk High Risk No Risk       Last PHQ 2/9 Scores:    05/26/2020    2:14 PM 09/03/2017    4:34 PM 08/07/2016    2:12 PM  Depression screen PHQ 2/9  Decreased Interest 3 3 3   Down, Depressed, Hopeless 3 1 2   PHQ - 2 Score 6 4 5   Altered sleeping 0 3 3  Tired, decreased energy 0 3 0  Change in appetite 3 3 0  Feeling bad or failure about yourself  0 3 1  Trouble concentrating 3 0 0  Moving slowly or fidgety/restless 3 0 1  Suicidal thoughts 0 0 0  PHQ-9 Score 15 16 10     Scribe for Treatment Team: , 12/21/2021 2:19 PM

## 2021-12-21 NOTE — Progress Notes (Signed)
   12/21/21 0515  Sleep  Number of Hours 5

## 2021-12-21 NOTE — Progress Notes (Signed)
Urine collected for UA and placed in lab fridge for pickup.

## 2021-12-21 NOTE — [facility] Group Notes (Signed)
[facility] Group Notes:  (Nursing/MHT/Case Management/Adjunct)  Date:  12/21/2021  Time:  11:18 AM  Type of Therapy:   Orientation/Goals group  Participation Level:  Active  Participation Quality:  Drowsy and Inattentive  Affect:  Lethargic and Not Congruent  Cognitive:  Disorganized, Confused, and Delusional  Insight:  Improving and Lacking  Engagement in Group:  Limited and Off Topic  Modes of Intervention:  Discussion, Education, Orientation, and Support  Summary of Progress/Problems: Pt goal for today is to take medications and find out when she can leave.   Jaynie Hitch J Anmol Paschen 12/21/2021, 11:18 AM

## 2021-12-21 NOTE — Progress Notes (Signed)
Adult Psychoeducational Group Note  Date:  12/21/2021 Time:  9:34 PM  Group Topic/Focus:  Wrap-Up Group:   The focus of this group is to help patients review their daily goal of treatment and discuss progress on daily workbooks.  Participation Level:  Active  Participation Quality:  Appropriate  Affect:  Appropriate  Cognitive:  Appropriate  Insight: Appropriate  Engagement in Group:  Engaged  Modes of Intervention:  Discussion  Additional Comments:   Pt attended and actively participated in the Wrap Up group. Pt rated her day/mood 6/10. Pt reports no progress toward her goal of discharging from "here". Pt stated that she is ready to go and tired of being here. Pt enjoyed the visitation with her mother today which helped her feel better. Pt inquired about the next session date with her assigned social worker, this MHT was unable to provide any insight, however agreed to follow up with treatment team.   Natalie Brown 12/21/2021, 9:34 PM

## 2021-12-21 NOTE — Plan of Care (Signed)
Family meeting was held with guardian (Ms. [name]), physician (Saleha Kalp [place]) , and 2 social workers [place], and [facility]) for 45 minutes. Guardian was provided print outs of the patient's current medications with information about the medications, and indications and doses of medications were discussed in detail. I made the guardian aware that today I have stopped her Aricept in the event it is making her dementia worse and have held her Cogentin to make sure the anticholinergic properties are not making her cognitive issues worse. I advised that we reduced her Haldol to 1mg  qam and 2mg  qhs yesterday, and we are watching closely after medication changes for any acute decompensation. I advised if she worsens with recurrence of psychosis/mania/delusions or if she has signs of EPS despite dose reduction in Haldol, that we may have to discuss restart of Haldol previous dosing and restart of Cogentin. The FDA black box warning for use of antipsychotics in dementia patients was again discussed and time was given for questions. I have suggested that once out of the hospital that she see neurology for further dementia w/u. SW discussed her pending medicaid application and group home process. Her guardian was made aware that we continue to prompt her for hygiene but she remains resistant to shower, change clothes, or allow staff to wash clothes for her. Her continued cheeking of food and need for prompts to take small bites was again reviewed. Coordination of care discussed.   , MD, 

## 2021-12-21 NOTE — Progress Notes (Signed)
Pt a little more confused last night and very anxious , not trying to lay down to go to sleep. Pt trying to eat old food in her room and staff removed out her room to prevent her from getting sick. Pt given PRN Ativan per MAR last night

## 2021-12-21 NOTE — Progress Notes (Signed)
   12/21/21 2115  Psych Admission Type (Psych Patients Only)  Admission Status Involuntary  Psychosocial Assessment  Patient Complaints Confusion  Eye Contact Brief  Facial Expression Flat  Affect Appropriate to circumstance  Speech Soft  Interaction Cautious  Motor Activity Slow  Appearance/Hygiene Disheveled  Behavior Characteristics Cooperative  Mood Suspicious;Preoccupied  Aggressive Behavior  Effect No apparent injury  Thought Process  Coherency Disorganized  Content WDL  Delusions None reported or observed  Perception WDL  Hallucination None reported or observed  Judgment Impaired  Confusion None  Danger to Self  Current suicidal ideation? Denies  Danger to Others  Danger to Others None reported or observed  Danger to Others Abnormal  Harmful Behavior to others No threats or harm toward other people

## 2021-12-21 NOTE — Group Note (Signed)
Recreation Therapy Group Note   Group Topic:Self-Esteem  Group Date: 12/21/2021 Start Time: 1015 End Time: 1050 Facilitators: Caroll Rancher, LRT,CTRS Location: [address] Dayroom   Goal Area(s) Addresses:  Patient will successfully identify positive attributes about themselves.  Patient will identify healthy ways to increase self-esteem. Patient will acknowledge benefit(s) of improved self-esteem.   Group Description:  Self Retrospective.  Patients were to envision talking to their younger selves. Patients were to then express, based on the things they had learned thus far in life, what advice they would give their younger selves to keep them from making some of the decisions they have made as an adult.   Affect/Mood: N/A   Participation Level: Did not attend    Clinical Observations/Individualized Feedback:     Plan: Continue to engage patient in RT group sessions 2-3x/week.   Caroll Rancher, LRT,CTRS 12/21/2021 12:50 PM

## 2021-12-21 NOTE — Progress Notes (Signed)
Patient showered this shift.

## 2021-12-21 NOTE — Progress Notes (Signed)
[facility] MD Progress Note  12/21/2021 7:20 AM Natalie Brown  MRN:  [id number]  Reason for Admission:  Natalie Brown is a 56 y.o. female  who was initially admitted for inpatient psychiatric hospitalization on 09/06/2021 for management of mania, disorganized behaviors/thinking, and delusions. The patient is currently on Hospital Day 106.   Chart Review from last 24 hours:  The patient's chart was reviewed and nursing notes were reviewed. The patient's case was discussed in multidisciplinary team meeting. Per nursing, the patient was more confused, was trying to eat old food in her room, and required Ativan PRN due to anxiety last night. She remains disorganized but did attend groups. Per MAR she was compliant with scheduled medications and did require Ativan X1 last night for anxiety but no other PRNs.   Information Obtained Today During Patient Interview: The patient was interviewed on the unit. The patient is sitting up in bed eating breakfast. She has some cognitive slowing but is more oriented today (knows date, year, city and that she is in behavioral health). [name] disheveled in dirty clothes and has poor hygiene. She claims she has showered. She was prompted to shower and allow staff to help her change her clothes. She denies SI, HI, AVH, paranoia, ideas of reference for first rank symptoms. She reports "good" mood. She does not make any unprompted delusional statements. She reports good sleep and appetite and voices no physical complaints. She reports BM yesterday. She states she talked to her family yesterday. I advised that I am meeting with her mother for a family meeting today. [name] not as ruminative about discharge today.  Principal Problem: Bipolar affective disorder, current episode manic with psychotic symptoms ([name]) Diagnosis: Principal Problem:   Bipolar affective disorder, current episode manic with psychotic symptoms ([name]) Active Problems:   Major neurocognitive disorder  ([name])  Past Psychiatric History: see H&P  Past Medical History:  Past Medical History:  Diagnosis Date   Bipolar 1 disorder with moderate mania ([name]) 12/30/2013   Hypertension    Family History: see H&P  Family Psychiatric  History: see H&P  Social History:  Homeless; mother is interim guardian   Sleep: 5 hours  Appetite:  Good  Current Medications: Current Facility-Administered Medications  Medication Dose Route Frequency Provider Last Rate Last Admin   acetaminophen (TYLENOL) tablet 650 mg  650 mg Oral Q6H PRN Briant Cedar, MD   650 mg at 11/14/21 0759   alum & mag hydroxide-simeth (MAALOX/MYLANTA) 200-200-20 MG/5ML suspension 30 mL  30 mL Oral Q4H PRN Massengill, [name], MD       atropine 1 % ophthalmic solution 1 drop  1 drop Sublingual Q8H PRN Winfred Leeds, Nadir, MD   1 drop at 12/18/21 2135   benztropine (COGENTIN) tablet 0.5 mg  0.5 mg Oral BID Nelda Marseille, Nakkia Mackiewicz E, MD   0.5 mg at 12/20/21 1723   docusate sodium (COLACE) capsule 100 mg  100 mg Oral Daily Nelda Marseille, Dorethia Jeanmarie E, MD   100 mg at 12/20/21 0803   donepezil (ARICEPT) tablet 5 mg  5 mg Oral Daily Nelda Marseille, Cristin Penaflor E, MD   5 mg at 12/20/21 0803   feeding supplement (KATE FARMS STANDARD 1.4) liquid 325 mL  325 mL Oral BID BM Massengill, Nathan, MD   325 mL at 12/16/21 1404   fluticasone (FLONASE) 50 MCG/ACT nasal spray 1 spray  1 spray Each Nare Daily PRN Harlow Asa, MD   1 spray at 12/18/21 0941   haloperidol (HALDOL) tablet 1  mg  1 mg Oral Daily Nicholes Rough, NP   1 mg at 12/20/21 1610   haloperidol (HALDOL) tablet 2 mg  2 mg Oral QHS Nicholes Rough, NP   2 mg at 12/20/21 2056   hydrocerin (EUCERIN) cream   Topical BID Janine Limbo, MD   1 Application at 96/04/54 1718   hydrOXYzine (ATARAX) tablet 25 mg  25 mg Oral TID PRN Janine Limbo, MD   25 mg at 12/17/21 2027   LORazepam (ATIVAN) tablet 1 mg  1 mg Oral Q6H PRN Harlow Asa, MD   1 mg at 12/20/21 2205   magnesium hydroxide (MILK OF MAGNESIA)  suspension 30 mL  30 mL Oral Daily PRN Janine Limbo, MD   30 mL at 11/14/21 0802   nicotine (NICODERM CQ - dosed in mg/24 hours) patch 14 mg  14 mg Transdermal Daily Nelda Marseille, Crist Kruszka E, MD   14 mg at 12/20/21 0806   OLANZapine zydis (ZYPREXA) disintegrating tablet 5 mg  5 mg Oral Q8H PRN Harlow Asa, MD   5 mg at 10/14/21 1416   And   ziprasidone (GEODON) injection 20 mg  20 mg Intramuscular PRN Harlow Asa, MD       ondansetron [facility]) tablet 4 mg  4 mg Oral Q8H PRN Harlow Asa, MD   4 mg at 11/23/21 0981   polyethylene glycol (MIRALAX / GLYCOLAX) packet 17 g  17 g Oral Daily PRN Harlow Asa, MD       propranolol (INDERAL) tablet 10 mg  10 mg Oral BID Harlow Asa, MD   10 mg at 12/20/21 1723   Lab Results:  Results for orders placed or performed during the hospital encounter of 09/06/21 (from the past 48 hour(s))  Comprehensive metabolic panel     Status: Abnormal   Collection Time: 12/20/21  6:25 PM  Result Value Ref Range   Sodium 141 135 - 145 mmol/L   Potassium 4.3 3.5 - 5.1 mmol/L   Chloride 110 98 - 111 mmol/L   CO2 26 22 - 32 mmol/L   Glucose, Bld 92 70 - 99 mg/dL    Comment: Glucose reference range applies only to samples taken after fasting for at least 8 hours.   BUN 14 6 - 20 mg/dL   Creatinine, Ser 1.19 (H) 0.44 - 1.00 mg/dL   Calcium 9.1 8.9 - 10.3 mg/dL   Total Protein 7.2 6.5 - 8.1 g/dL   Albumin 3.6 3.5 - 5.0 g/dL   AST 20 15 - 41 U/L   ALT 14 0 - 44 U/L   Alkaline Phosphatase 58 38 - 126 U/L   Total Bilirubin 0.6 0.3 - 1.2 mg/dL   GFR, Estimated 54 (L) >60 mL/min    Comment: (NOTE) Calculated using the CKD-EPI Creatinine Equation (2021)    Anion gap 5 5 - 15    Comment: Performed at [facility], [place] [address] [place], [place] [address]  CBC with Differential/Platelet     Status: None   Collection Time: 12/20/21  6:25 PM  Result Value Ref Range   WBC 6.2 4.0 - 10.5 K/uL   RBC 4.67 3.87 - 5.11 MIL/uL    Hemoglobin 13.9 12.0 - 15.0 g/dL   HCT 42.1 36.0 - 46.0 %   MCV 90.1 80.0 - 100.0 fL   MCH 29.8 26.0 - 34.0 pg   MCHC 33.0 30.0 - 36.0 g/dL   RDW 13.2 11.5 - 15.5 %   Platelets 302  150 - 400 K/uL   nRBC 0.0 0.0 - 0.2 %   Neutrophils Relative % 57 %   Neutro Abs 3.6 1.7 - 7.7 K/uL   Lymphocytes Relative 33 %   Lymphs Abs 2.1 0.7 - 4.0 K/uL   Monocytes Relative 6 %   Monocytes Absolute 0.4 0.1 - 1.0 K/uL   Eosinophils Relative 3 %   Eosinophils Absolute 0.2 0.0 - 0.5 K/uL   Basophils Relative 1 %   Basophils Absolute 0.0 0.0 - 0.1 K/uL   Immature Granulocytes 0 %   Abs Immature Granulocytes 0.01 0.00 - 0.07 K/uL    Comment: Performed at [facility], [place] [address] [place], [place] [address]     Blood Alcohol level:  Lab Results  Component Value Date   ETH <10 17/49/4496   Metabolic Disorder Labs: Lab Results  Component Value Date   HGBA1C 5.3 09/08/2021   MPG 105.41 09/08/2021   No results found for: "PROLACTIN" Lab Results  Component Value Date   CHOL 173 09/08/2021   TRIG 110 09/08/2021   HDL 50 09/08/2021   CHOLHDL 3.5 09/08/2021   VLDL 22 09/08/2021   LDLCALC 101 (H) 09/08/2021   LDLCALC 102 (H) 06/21/2020   Physical Findings:  AIMS: Facial and Oral Movements Muscles of Facial Expression: None, normal Lips and Perioral Area: None, normal Jaw: None, normal Tongue: None, normal,Extremity Movements Upper (arms, wrists, hands, fingers): None, normal Lower (legs, knees, ankles, toes): None, normal, Trunk Movements Neck, shoulders, hips: None, normal, Overall Severity Severity of abnormal movements (highest score from questions above): None, normal Incapacitation due to abnormal movements: None, normal Patient's awareness of abnormal movements (rate only patient's report): No Awareness, Dental Status Current problems with teeth and/or dentures?: No Does patient usually wear dentures?: No   Musculoskeletal: Strength & Muscle Tone:  normal Gait & Station: normal Patient leans: N/A  Psychiatric Specialty Exam:  Presentation  General Appearance: unkempt appearing  - wearing hair bonnet, wearing dirty clothes and face appears unwashed  Eye Contact:Good  Speech: speaks in short phrases to direct questions with some cognitive slowing  Speech Volume:Normal  Mood and Affect  Mood:described as "good" appears confused  Affect: calm, polite, confused appearing  Thought Process  Thought Processes: concrete but linear  Orientation:Oriented to month, date, year, city and that she is in [place]: She does not make delusional statements; she denies SI, HI, AVH, paranoia, ideas of reference of first rank symptoms - is not grossly responding to internal stimuli on exam  Hallucinations:Denied  Ideas of Reference:Denied  Suicidal Thoughts:Denied   Homicidal Thoughts:Denied   Sensorium  Memory:Poor  Judgment:Poor  Insight:Poor  Executive Functions  Concentration:Poor - has some cognitive slowing and requires questions to be asked several times with some cognitive delay  Attention Span:Poor  Recall:Poor  Fund of Knowledge:Limited  Language:Fair  Psychomotor Activity  Psychomotor Activity:no restlessness, akathisia or tremors noted  Assets  Assets:social support, guardianship, resilience  Physical Exam Vitals and nursing note reviewed.  Constitutional:      Appearance: Normal appearance.  HENT:     Head: Normocephalic.     Mouth/Throat:     Comments: Some drooling and cheeking of food on exam while eating Pulmonary:     Effort: Pulmonary effort is normal.  Neurological:     General: No focal deficit present.     Mental Status: [name] alert.    Review of Systems  Respiratory:  Negative for shortness of breath.  Cardiovascular:  Negative for chest pain and palpitations.  Gastrointestinal:  Negative for abdominal pain, constipation, diarrhea, nausea and vomiting.  Neurological:  Negative.  Negative for dizziness and headaches.   Blood pressure 118/83, pulse (!) 111, temperature (!) 97.3 F (36.3 C), temperature source Oral, resp. rate 16, height 5' 6"  (1.676 m), weight 72.6 kg, SpO2 99 %. Body mass index is 25.83 kg/m.  Treatment Plan Summary:  Diagnoses / Active Problems: Bipolar I MRE manic with psychotic features Major Neurocognitive d/o with behavioral disturbance  PLAN: Safety and Monitoring:  -- Voluntary admission (signed in by guardian for treatment on 11/19/21) to inpatient psychiatric unit for safety, stabilization and treatment  -- Daily contact with patient to assess and evaluate symptoms and progress in treatment  -- Patient's case to be discussed in multi-disciplinary team meeting  -- Observation Level : q15 minute checks  -- Vital signs:  q12 hours  -- Precautions: suicide, elopement, and assault  -- Family meeting scheduled today with guardian  2. Psychiatric Diagnoses and Treatment:   Bipolar I MRE manic with psychotic features  Major neurocognitive disorder with behavioral disturbance (marked for age brain atrophy on CT head and MOCA 11/30)   -Continue Haldol 69m qam and 211mqhs with attempt to taper off as tolerated - Due to confusion, will hold Cogentin to see if anticholinergic effects are worsening confusion and monitor for EPS with dose reduction in Haldol - Continue atropine ophthalmologic gtts 1 drop q12 hour SL PRN drooling  - Continue Inderal 1077mid for help with anxiety and tachycardia  - Stop Aricept 5mg68m the event she has frontotemporal dementia that is being worsened with med -Invega Sustenna 234 mg IM given on 09-30-21, 156mg51mgiven 10/12/20 - Invega 78 mg given on 7/21; Invega 78mg 12mn 8/18  -- Zyprexa/Geodon/ativan agitation protocol PRN   --Additional psychosis labs (HIV- , RPR-, ANA-, heavy metal WNL, ESR wnl, ceruloplasmin wnl and CT head shows Marked for age brain atrophy.  No acute or reversible finding.)    --Depakote, Ativan, and Zyprexa stopped -- Interim guardianship in place with mother and waiting on SSDI and medicaid for group home referral -- working on prompts to allow staff to assist with ADLs and hygiene but she is resistant - Given recent increased confusion, checked labs CBC WNL (afebrile), CMP WNL other than creatinine 1.19 GFR 54 and UA pending  3. Medical Issues Being Addressed:   Tobacco Use Disorder  -- Nicotine patch 14mg/256murs ordered and d/c nicotine gum    Hyperammonemia -resolved --Ammonia level 30 on 10/27/21 and 25 on 8/4 --Depakote was stopped     Elevated creatinine - improved --Repeat 8/31 1.19 with GFR 54 -- Continue to encourage po fluids and trend     Seasonal allergies --Restart Flonase PRN per guardian approval  Constipation - improved -- Continue colace 100mg qd77ming  -- PRN Miralax for severe constipation  Nausea/Vomiting -resolved -- SLP saw for swallow assessment and feels is cognitively based dysphagia - no limitations continue regular diet and thin liquid -- monitoring to help with swallowing/portion control   Tachycardia  -- Rechecking EKG and starting Inderal 10mg bid74m 96-111 today and 88-90 yesterday)  Hypokalemia - resolved - Received K+ 20 meq bid and repeat BMP 8/18 shows K+ 4.0  4. Discharge Planning:   -- Social work and case management to assist with discharge planning and identification of hospital follow-up needs prior to discharge  -- Discharge Concerns: Need to establish a safety  plan; Medication compliance and effectiveness; safe housing   -- Discharge Goals: ALF or group home with outpatient referrals for mental health follow-up including medication management/psychotherapy  Labs On Admission- CMP: WNL except Cr: 1.1,  GFR: 60,  CBC: WNL,  UDS: Neg,  Troponin: 3,  EtOH: Neg,  beta hCG: 6.4,  Resp Panel: Neg 5/29- Urine Preg: Neg,  A1c: 5.3,  TSH: 1.719,  Lipid Panel: WNL except LDL: 101 5/24- BMP: WNL except Cr: 1.01,   Ca: 8.4,  hCG: 5,  Urine Preg: Neg 5/28- Ammonia: 32,  CBC: WNL,  CMP: WNL except  Cr: 1.08,  Ca: 8.6,  Depakote lvl: 20 6/3- Depakote lvl: 64, Hep Function: WNL,  CBC: WNL except Hem: 11.9 6/7- Depakote lvl: 75 6/18- UA: Neg,  CBC: WNL,  Depakote lvl: 136,  Ammonia: 99,  BMP: WNL except BUN: 22,  Cr: 1.19,  Ca: 8.6,  GFR: 54,  Hep Function: WNL except Albu: 3.3,  Indirect Bili: 0.2 6/20- ANA: Neg,  Ammonia: 48,  Ceruloplasmin: 24.2,  CMP: WNL except BUN: 23,  Cr: 1.35,  Ca: 8.3,  Albu: 3.4  HIV:Neg,  RPR: Neg,  Sed Rate: 6,  Depakote lvl: 25,  Heavy Metal: Neg 6/23- Ammonia: 46,  BMP: WNL except Cr: 1.2,  Ca: 8.3,  GFR: 54 6/24- UA: WNL except Bacteria: rare 6/30- CMP:  WNL except Cr: 1.04,  Ca: 8.6,  CBC: WNL,  Ammonia: 41, EKG: NSR with Qtc: 399 7/8 - CMP WNL other than CR 1.33 and GFR 48; Ammonia 30 7/18 - Cr of 1.22, eGFR of 53 7/23 - EKG with Qtc of 422 7/31 - creatinine 1.08 8/1 - creatinine 1.24 8/3 - COVID negative 8/4 - CBC WNL, NH3 25, CMP WNL other than creatinine 1.05 and glucose 122, lipase WNL 8/6 - CXR negative and COVID negative 8/16 - Creatinine 1.42 with GFR 44, glucose 146 and K+ 3.4 otherwise BMP WNL 8/18 - Creatinine 1.19 with GFR 54, glucose 110, and CL 112 and K+ 4.0 8/31 - Creatinine 1.19 with GFR 54 otherwise CMP WNL; CBC WNL  Harlow Asa, MD, FAPA 12/21/2021, 7:20 AM

## 2021-12-21 NOTE — Group Note (Signed)
LCSW Group Therapy  Type of Therapy and Topic:  Group Therapy: Thoughts, Feelings, and Actions  Participation Level:  Did Not Attend   Description of Group:   In this group, each patient discussed their previous experiencing and understanding of overthinking, identifying the harmful impact on their lives. As a group, each patient was introduced to the basic concepts of Cognitive Behavioral Therapy: that thoughts, feelings, and actions are all connected and influence one another. They were given examples of how overthinking can affect our feelings, actions, and vise versa. The group was then asked to analyze how overthinking was harmful and brainstorm alternative thinking patterns/reactions to the example situation. Then, each group member filled out and identified their own example situation in which a problem situation caused their thoughts, feelings, and actions to be negatively impacted; they were asked to come up with 3 new (more adaptive/positive) thoughts that led to 3 new feelings and actions.  Therapeutic Goals: Patients will review and discuss their past experience with overthinking. Patients will learn the basics of the CBT model through group-led examples.. Patients will identify situations where they may have negative thoughts, feelings, or actions and will then reframe the situation using more positive thoughts to react differently.  Summary of Patient Progress:  Did not attend  Therapeutic Modalities:   Cognitive Behavioral Therapy Mindfulness  Beatris Si, LCSW 12/21/2021  11:56 AM

## 2021-12-21 NOTE — Plan of Care (Signed)
Alert, verbal and able to make needs known. Patient is slightly brighter today than she was yesterday. She denies SI/HI/AVH. Denies any pain at present. She continues to be fixated on going home. She took her medication as ordered but did question where the white pill was (Aricept) and I explained it had been discontinued. Will continue q50min checks and provide verbal encouragement as needed.   Problem: Education: Goal: Knowledge of [place] [facility] Education information/materials will improve Outcome: Progressing Goal: Emotional status will improve Outcome: Progressing Goal: Mental status will improve Outcome: Not Progressing   Problem: Activity: Goal: Interest or engagement in activities will improve Outcome: Not Progressing Goal: Sleeping patterns will improve Outcome: Progressing   Problem: Coping: Goal: Ability to verbalize frustrations and anger appropriately will improve Outcome: Not Progressing Goal: Ability to demonstrate self-control will improve Outcome: Progressing

## 2021-12-22 NOTE — Progress Notes (Signed)
   12/22/21 0515  Sleep  Number of Hours 5.5

## 2021-12-22 NOTE — Group Note (Signed)
LCSW Group Therapy Note 12/22/2021  11:00am - 12:00pm  Type of Therapy and Topic:  Group Therapy: Anger and Powerlessness  Participation Level:  Minimal   Description of Group: In this group, patients identified a frequent cause of their anger and a frequent reaction.  They analyzed how their reaction was possibly beneficial and how it was possibly unhelpful.  Their commonalities with other patients was pointed out and discussed.  It was also discussed that feelings of powerlessness are often present in the situations described, and that they have this feeling in common with other people as well.  The bulk of group time was spent talking about how they can take back their power in small but significant ways that feels empowering.  Therapeutic Goals: Patients will describe a frequent cause of anger, plus other emotions that are felt along with the anger Patients will identify their typical manner of reacting to this anger Patients will be able to acknowledge the common theme of powerlessness among the group members Patients will consider ways to take back their power where and when they are able  Summary of Patient Progress:  The patient was late to group and left early.  She shared that one of her frequent causes of anger is "being here."  Her typical reaction is to use spirituality.    Therapeutic Modalities:   Processing Brief Solution-Focused Therapy  Lynnell Chad, LCSW 12/22/2021  1:33 PM

## 2021-12-22 NOTE — Progress Notes (Signed)
Adult Psychoeducational Group Note  Date:  12/22/2021 Time:  8:55 PM  Group Topic/Focus:  Wrap-Up Group:   The focus of this group is to help patients review their daily goal of treatment and discuss progress on daily workbooks.  Participation Level:  Active  Participation Quality:  Appropriate  Affect:  Appropriate  Cognitive:  Appropriate  Insight: Appropriate  Engagement in Group:  Improving  Modes of Intervention:  Discussion  Additional Comments:   Pt stated her goal for today was to focus on her treatment plan. Pt stated she accomplished her goal today. Pt stated she talked with her doctor and with her social worker about her care today. Pt rated her overall day a 6 out of 10. Pt stated her sister coming for visitation tonight improved her overall day. Pt stated she felt better about herself tonight. Pt stated she was able to attend all meals today. Pt stated she took all medications provided today. Pt stated her appetite was good today. Pt rated her sleep last night was good. Pt stated the goal tonight was to get some rest. Pt stated she had no physical pain tonight. Pt deny visual hallucinations and auditory issues tonight. Pt denies thoughts of harming herself or others. Pt stated she would alert staff if anything changed.   Felipa Furnace 12/22/2021, 8:55 PM

## 2021-12-22 NOTE — [facility] Group Notes (Signed)
Goals Group 12/22/2021   Group Focus: affirmation, clarity of thought, and goals/reality orientation Treatment Modality:  Psychoeducation Interventions utilized were assignment, group exercise, and support Purpose: To be able to understand and verbalize the reason for their admission to the hospital. To understand that the medication helps with their chemical imbalance but they also need to work on their choices in life. To be challenged to develop a list of 30 positives about themselves. Also introduce the concept that "feelings" are not reality.  Participation Level:  Active  Participation Quality:  Appropriate  Affect:  Appropriate  Cognitive:  Appropriate  Insight:  Improving  Engagement in Group:  Engaged  Additional Comments:  States her energy is a 6/10. Continues to verbalize that she wants to leave. When another participant was acting up, Pt joined right in stating she wanted to leave.  Dione Housekeeper

## 2021-12-22 NOTE — Progress Notes (Signed)
[facility] MD Progress Note  12/22/2021 7:07 AM Natalie Brown  MRN:  [id number]  Reason for Admission:  Natalie Brown is a 56 y.o. female  who was initially admitted for inpatient psychiatric hospitalization on 09/06/2021 for management of mania, disorganized behaviors/thinking, and delusions. The patient is currently on Hospital Day 107.   Chart Review from last 24 hours:  The patient's chart was reviewed and nursing notes were reviewed. The patient's case was discussed in multidisciplinary team meeting. Per nursing, patient showered on day shift. She attended 2 groups. She appeared slightly brighter to staff but overall remains disorganized. She reportedly was agitated in the morning and requested a PRN and received Zyprexa. No other behavioral issues noted. Per [facility] she was compliant with scheduled medications. She received Ativan X1 for anxiety last night and Zyprexa X1 for agitation yesterday morning.   Information Obtained Today During Patient Interview: The patient was interviewed on the unit. [name] more alert and has improved hygiene overall on exam. She states she washed her hair, body and face yesterday. She does have mild drooling and was reminded she has PRN atropine gtts she can use. [name] more ruminative today about desire for discharge and states she plans to stay with her sister after discharge. She states she had a BM this morning and does not feel she is constipated. She denies other physical complaints. She reports good sleep and appetite. She denies SI, HI, AVH, paranoia, ideas of reference, of first rank symptoms. 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 [name] oriented to date, city, state, and place today. She denies medication side-effects and states she visited with her mother yesterday. She cannot tell me what caused her to feel agitated  yesterday to need PRN. She could not cooperate for AIMS or testing for stiffness/cogwheeling and abruptly left interview to go to bathroom.   Principal Problem: Bipolar affective disorder, current episode manic with psychotic symptoms ([place]) Diagnosis: Principal Problem:   Bipolar affective disorder, current episode manic with psychotic symptoms ([place]) Active Problems:   Major neurocognitive disorder ([place])  Past Psychiatric History: see H&P  Past Medical History:  Past Medical History:  Diagnosis Date   Bipolar 1 disorder with moderate mania ([place]) 12/30/2013   Hypertension    Family History: see H&P  Family Psychiatric  History: see H&P  Social History:  Homeless; mother is interim guardian   Sleep: 5.5 hours  Appetite:  Good  Current Medications: Current Facility-Administered Medications  Medication Dose Route Frequency Provider Last Rate Last Admin   acetaminophen (TYLENOL) tablet 650 mg  650 mg Oral Q6H PRN Briant Cedar, MD   650 mg at 11/14/21 0759   alum & mag hydroxide-simeth (MAALOX/MYLANTA) 200-200-20 MG/5ML suspension 30 mL  30 mL Oral Q4H PRN Massengill, [name], MD       atropine 1 % ophthalmic solution 1 drop  1 drop Sublingual Q8H PRN Winfred Leeds, Nadir, MD   1 drop at 12/18/21 2135   docusate sodium (COLACE) capsule 100 mg  100 mg Oral Daily Nelda Marseille,  E, MD   100 mg at 12/21/21 1013   feeding supplement (KATE FARMS STANDARD 1.4) liquid 325 mL  325 mL Oral BID BM Massengill, Nathan, MD   325 mL at 12/16/21 1404   fluticasone (FLONASE) 50 MCG/ACT nasal spray 1 spray  1 spray Each Nare Daily PRN Nelda Marseille,  E,  MD   1 spray at 12/18/21 0941   haloperidol (HALDOL) tablet 1 mg  1 mg Oral Daily Nicholes Rough, NP   1 mg at 12/21/21 1013   haloperidol (HALDOL) tablet 2 mg  2 mg Oral QHS Nicholes Rough, NP   2 mg at 12/21/21 2045   hydrocerin (EUCERIN) cream   Topical BID Janine Limbo, MD   Given at 12/21/21 1724   hydrOXYzine (ATARAX) tablet 25 mg  25 mg  Oral TID PRN Janine Limbo, MD   25 mg at 12/17/21 2027   LORazepam (ATIVAN) tablet 1 mg  1 mg Oral Q6H PRN Harlow Asa, MD   1 mg at 12/21/21 2045   magnesium hydroxide (MILK OF MAGNESIA) suspension 30 mL  30 mL Oral Daily PRN Janine Limbo, MD   30 mL at 11/14/21 0802   nicotine (NICODERM CQ - dosed in mg/24 hours) patch 14 mg  14 mg Transdermal Daily Nelda Marseille, Antero Derosia E, MD   14 mg at 12/21/21 1018   OLANZapine zydis (ZYPREXA) disintegrating tablet 5 mg  5 mg Oral Q8H PRN Harlow Asa, MD   5 mg at 12/21/21 1027   And   ziprasidone (GEODON) injection 20 mg  20 mg Intramuscular PRN Harlow Asa, MD       ondansetron [facility]) tablet 4 mg  4 mg Oral Q8H PRN Harlow Asa, MD   4 mg at 11/23/21 3354   polyethylene glycol (MIRALAX / GLYCOLAX) packet 17 g  17 g Oral Daily PRN Harlow Asa, MD       propranolol (INDERAL) tablet 10 mg  10 mg Oral BID Harlow Asa, MD   10 mg at 12/21/21 1724   Lab Results:  Results for orders placed or performed during the hospital encounter of 09/06/21 (from the past 48 hour(s))  Comprehensive metabolic panel     Status: Abnormal   Collection Time: 12/20/21  6:25 PM  Result Value Ref Range   Sodium 141 135 - 145 mmol/L   Potassium 4.3 3.5 - 5.1 mmol/L   Chloride 110 98 - 111 mmol/L   CO2 26 22 - 32 mmol/L   Glucose, Bld 92 70 - 99 mg/dL    Comment: Glucose reference range applies only to samples taken after fasting for at least 8 hours.   BUN 14 6 - 20 mg/dL   Creatinine, Ser 1.19 (H) 0.44 - 1.00 mg/dL   Calcium 9.1 8.9 - 10.3 mg/dL   Total Protein 7.2 6.5 - 8.1 g/dL   Albumin 3.6 3.5 - 5.0 g/dL   AST 20 15 - 41 U/L   ALT 14 0 - 44 U/L   Alkaline Phosphatase 58 38 - 126 U/L   Total Bilirubin 0.6 0.3 - 1.2 mg/dL   GFR, Estimated 54 (L) >60 mL/min    Comment: (NOTE) Calculated using the CKD-EPI Creatinine Equation (2021)    Anion gap 5 5 - 15    Comment: Performed at [facility], [place] [address]  [place], [place] [address]  CBC with Differential/Platelet     Status: None   Collection Time: 12/20/21  6:25 PM  Result Value Ref Range   WBC 6.2 4.0 - 10.5 K/uL   RBC 4.67 3.87 - 5.11 MIL/uL   Hemoglobin 13.9 12.0 - 15.0 g/dL   HCT 42.1 36.0 - 46.0 %   MCV 90.1 80.0 - 100.0 fL   MCH 29.8 26.0 - 34.0 pg   MCHC 33.0 30.0 - 36.0  g/dL   RDW 13.2 11.5 - 15.5 %   Platelets 302 150 - 400 K/uL   nRBC 0.0 0.0 - 0.2 %   Neutrophils Relative % 57 %   Neutro Abs 3.6 1.7 - 7.7 K/uL   Lymphocytes Relative 33 %   Lymphs Abs 2.1 0.7 - 4.0 K/uL   Monocytes Relative 6 %   Monocytes Absolute 0.4 0.1 - 1.0 K/uL   Eosinophils Relative 3 %   Eosinophils Absolute 0.2 0.0 - 0.5 K/uL   Basophils Relative 1 %   Basophils Absolute 0.0 0.0 - 0.1 K/uL   Immature Granulocytes 0 %   Abs Immature Granulocytes 0.01 0.00 - 0.07 K/uL    Comment: Performed at [facility], [place] [address] [place], [place] [address]  Urinalysis, Routine w reflex microscopic Urine, Clean Catch     Status: None   Collection Time: 12/21/21 11:02 AM  Result Value Ref Range   Color, Urine YELLOW YELLOW   APPearance CLEAR CLEAR   Specific Gravity, Urine 1.017 1.005 - 1.030   pH 6.0 5.0 - 8.0   Glucose, UA NEGATIVE NEGATIVE mg/dL   Hgb urine dipstick NEGATIVE NEGATIVE   Bilirubin Urine NEGATIVE NEGATIVE   Ketones, ur NEGATIVE NEGATIVE mg/dL   Protein, ur NEGATIVE NEGATIVE mg/dL   Nitrite NEGATIVE NEGATIVE   Leukocytes,Ua NEGATIVE NEGATIVE    Comment: Performed at [facility], [place] [address], [place], [place] [address]     Blood Alcohol level:  Lab Results  Component Value Date   ETH <10 82/50/5397   Metabolic Disorder Labs: Lab Results  Component Value Date   HGBA1C 5.3 09/08/2021   MPG 105.41 09/08/2021   No results found for: "PROLACTIN" Lab Results  Component Value Date   CHOL 173 09/08/2021   TRIG 110 09/08/2021   HDL 50 09/08/2021   CHOLHDL 3.5 09/08/2021   VLDL 22  09/08/2021   LDLCALC 101 (H) 09/08/2021   LDLCALC 102 (H) 06/21/2020   Physical Findings:  AIMS: Facial and Oral Movements Muscles of Facial Expression: None, normal Lips and Perioral Area: None, normal Jaw: None, normal Tongue: None, normal,Extremity Movements Upper (arms, wrists, hands, fingers): None, normal Lower (legs, knees, ankles, toes): None, normal, Trunk Movements Neck, shoulders, hips: None, normal, Overall Severity Severity of abnormal movements (highest score from questions above): None, normal Incapacitation due to abnormal movements: None, normal Patient's awareness of abnormal movements (rate only patient's report): No Awareness, Dental Status Current problems with teeth and/or dentures?: No Does patient usually wear dentures?: No   Musculoskeletal: Strength & Muscle Tone: normal Gait & Station: normal Patient leans: N/A  Psychiatric Specialty Exam:  Presentation  General Appearance: improved hygiene - still wearing hair bonnet and top has drool/food stains  Eye Contact:Fair  Speech: improved spontaneity of speech; answers with longer responses with normal fluency  Speech Volume:Normal  Mood and Affect  Mood:described as "good" appears anxious when discussing discharge desire  Affect: mildly anxious, polite  Thought Process  Thought Processes: concrete and ruminative about desire for discharge  Orientation:Oriented to month, date, year, city and that she is in [facility]  Thought Content: Has residual delusions but does not mention without prompts; she denies SI, HI, AVH, paranoia, ideas of reference of first rank symptoms - is not grossly responding to internal stimuli on exam  Hallucinations:Denied  Ideas of Reference:Denied  Suicidal Thoughts:Denied   Homicidal Thoughts:Denied   Sensorium  Memory:Poor  Judgment:Poor  Insight:Poor  Executive Functions  Concentration:Poor - improved  processing speed today with less cognitive  slowing  Attention Span:Poor  Recall:Poor  Fund of Knowledge:Limited  Language:Fair  Psychomotor Activity  Psychomotor Activity:no restlessness, akathisia or tremors noted but would not cooperate for AIMS or testing for cogwheeling/stiffness  Assets  Assets:social support, guardianship, resilience  Physical Exam Vitals and nursing note reviewed.  Constitutional:      Appearance: Normal appearance.  HENT:     Head: Normocephalic.     Mouth/Throat:     Comments: Some drooling  Pulmonary:     Effort: Pulmonary effort is normal.  Neurological:     General: No focal deficit present.     Mental Status: [name] alert.    Review of Systems  Respiratory:  Negative for shortness of breath.   Cardiovascular:  Negative for chest pain and palpitations.  Gastrointestinal:  Negative for abdominal pain, constipation, diarrhea, nausea and vomiting.  Neurological: Negative.  Negative for dizziness and headaches.   Blood pressure 127/89, pulse 94, temperature 97.8 F (36.6 C), temperature source Oral, resp. rate 16, height 5' 6" (1.676 m), weight 72.6 kg, SpO2 100 %. Body mass index is 25.83 kg/m.  Treatment Plan Summary:  Diagnoses / Active Problems: Bipolar I MRE manic with psychotic features Major Neurocognitive d/o with behavioral disturbance  PLAN: Safety and Monitoring:  -- Voluntary admission (signed in by guardian for treatment on 11/19/21) to inpatient psychiatric unit for safety, stabilization and treatment  -- Daily contact with patient to assess and evaluate symptoms and progress in treatment  -- Patient's case to be discussed in multi-disciplinary team meeting  -- Observation Level : q15 minute checks  -- Vital signs:  q12 hours  -- Precautions: suicide, elopement, and assault  -- Family meeting held 9/1  2. Psychiatric Diagnoses and Treatment:   Bipolar I MRE manic with psychotic features  Major neurocognitive disorder with behavioral disturbance (marked for age  brain atrophy on CT head and MOCA 11/30)   -Continue Haldol 27m qam and 242mqhs with attempt to taper off as tolerated - Due to confusion, stopped Cogentin 9/1 to see if anticholinergic effects were worsening confusion and monitor for EPS with dose reduction in Haldol - attempt AIMS+ again when she will cooperate - Continue atropine ophthalmologic gtts 1 drop q12 hour SL PRN drooling - encouraged use - Continue Inderal 1071mid for help with anxiety and tachycardia  - Stopped Aricept 5mg48m1 in the event she has frontotemporal dementia that is being worsened with med -InveLorayne Bendertenna 234 mg IM given on 09-30-21, 156mg47mgiven 10/12/20 - Invega 78 mg given on 7/21; Invega 78mg 29mn 8/18 - next due 9/15 -- Zyprexa/Geodon/ativan agitation protocol PRN   --Additional psychosis labs (HIV- , RPR-, ANA-, heavy metal WNL, ESR wnl, ceruloplasmin wnl and CT head shows Marked for age brain atrophy.  No acute or reversible finding.)   --Depakote, Ativan, and Zyprexa stopped -- Interim guardianship in place with mother and waiting on SSDI and medicaid for group home referral -- working on prompts to allow staff to assist with ADLs and hygiene - Given recent increased confusion, checked labs 8/31 and 9/1 CBC WNL (afebrile), CMP WNL other than creatinine 1.19 GFR 54 and UA negative  3. Medical Issues Being Addressed:   Tobacco Use Disorder  -- Nicotine patch 14mg/220murs ordered and d/c nicotine gum    Hyperammonemia -resolved --Ammonia level 30 on 10/27/21 and 25 on 8/4 --Depakote was stopped     Elevated creatinine - improved --Repeat 8/31 1.19 with  GFR 54 -- Continue to encourage po fluids and trend     Seasonal allergies --Restart Flonase PRN per guardian approval  Constipation - improved -- Continue colace 155m qd dosing  -- PRN Miralax for severe constipation  Nausea/Vomiting -resolved -- SLP saw for swallow assessment and feels is cognitively based dysphagia - no limitations continue  regular diet and thin liquid -- monitoring to help with swallowing/portion control   Tachycardia  -- Rechecking EKG and starting Inderal 145mbid (HR 78-94)  Hypokalemia - resolved - Received K+ 20 meq bid and repeat BMP 8/18 shows K+ 4.0 and 8/31 K+ 4.3  4. Discharge Planning:   -- Social work and case management to assist with discharge planning and identification of hospital follow-up needs prior to discharge  -- Discharge Concerns: Need to establish a safety plan; Medication compliance and effectiveness; safe housing   -- Discharge Goals: ALF or group home with outpatient referrals for mental health follow-up including medication management/psychotherapy  Labs On Admission- CMP: WNL except Cr: 1.1,  GFR: 60,  CBC: WNL,  UDS: Neg,  Troponin: 3,  EtOH: Neg,  beta hCG: 6.4,  Resp Panel: Neg 5/29- Urine Preg: Neg,  A1c: 5.3,  TSH: 1.719,  Lipid Panel: WNL except LDL: 101 5/24- BMP: WNL except Cr: 1.01,  Ca: 8.4,  hCG: 5,  Urine Preg: Neg 5/28- Ammonia: 32,  CBC: WNL,  CMP: WNL except  Cr: 1.08,  Ca: 8.6,  Depakote lvl: 20 6/3- Depakote lvl: 64, Hep Function: WNL,  CBC: WNL except Hem: 11.9 6/7- Depakote lvl: 75 6/18- UA: Neg,  CBC: WNL,  Depakote lvl: 136,  Ammonia: 99,  BMP: WNL except BUN: 22,  Cr: 1.19,  Ca: 8.6,  GFR: 54,  Hep Function: WNL except Albu: 3.3,  Indirect Bili: 0.2 6/20- ANA: Neg,  Ammonia: 48,  Ceruloplasmin: 24.2,  CMP: WNL except BUN: 23,  Cr: 1.35,  Ca: 8.3,  Albu: 3.4  HIV:Neg,  RPR: Neg,  Sed Rate: 6,  Depakote lvl: 25,  Heavy Metal: Neg 6/23- Ammonia: 46,  BMP: WNL except Cr: 1.2,  Ca: 8.3,  GFR: 54 6/24- UA: WNL except Bacteria: rare 6/30- CMP:  WNL except Cr: 1.04,  Ca: 8.6,  CBC: WNL,  Ammonia: 41, EKG: NSR with Qtc: 399 7/8 - CMP WNL other than CR 1.33 and GFR 48; Ammonia 30 7/18 - Cr of 1.22, eGFR of 53 7/23 - EKG with Qtc of 422 7/31 - creatinine 1.08 8/1 - creatinine 1.24 8/3 - COVID negative 8/4 - CBC WNL, NH3 25, CMP WNL other than creatinine 1.05 and  glucose 122, lipase WNL 8/6 - CXR negative and COVID negative 8/16 - Creatinine 1.42 with GFR 44, glucose 146 and K+ 3.4 otherwise BMP WNL 8/18 - Creatinine 1.19 with GFR 54, glucose 110, and CL 112 and K+ 4.0 8/31 - Creatinine 1.19 with GFR 54 otherwise CMP WNL; CBC WNL 9/1 - UA negative  AmHarlow AsaMD, FAPA 12/22/2021, 7:07 AM

## 2021-12-23 MED ORDER — LORAZEPAM 1 MG PO TABS
0.5000 mg | ORAL_TABLET | Freq: Four times a day (QID) | ORAL | Status: DC | PRN
  Administered 2021-12-24: 0.5 mg via ORAL
  Filled 2021-12-23: qty 1

## 2021-12-23 MED ORDER — DOCUSATE SODIUM 100 MG PO CAPS
100.0000 mg | ORAL_CAPSULE | Freq: Every day | ORAL | Status: DC | PRN
  Administered 2021-12-24: 100 mg via ORAL

## 2021-12-23 NOTE — Progress Notes (Signed)
   12/22/21 2115  Psych Admission Type (Psych Patients Only)  Admission Status Involuntary  Psychosocial Assessment  Patient Complaints Confusion  Eye Contact Brief  Facial Expression Flat  Affect Appropriate to circumstance  Speech Soft;Slow  Interaction No initiation;Minimal  Motor Activity Slow  Appearance/Hygiene Disheveled  Behavior Characteristics Cooperative  Mood Preoccupied  Thought Process  Coherency Disorganized  Content Preoccupation  Delusions None reported or observed  Perception UTA  Hallucination None reported or observed  Judgment Impaired  Confusion Mild  Danger to Self  Current suicidal ideation? Denies

## 2021-12-23 NOTE — Progress Notes (Addendum)
Patient s boyfriend visited on tonight. Natalie Brown appeared more disorganized tonight and had to be redirected or reminded often to wipe her mouth or take her medicine. She would just give a blank stare as if she didn't Agricultural engineer or staff. Support given and safety maintained on unit with 15 min checks.       12/23/21 2137  Psych Admission Type (Psych Patients Only)  Admission Status Involuntary  Psychosocial Assessment  Patient Complaints Disorientation  Eye Contact Brief  Facial Expression Flat  Affect Appropriate to circumstance  Speech Soft;Slow  Interaction No initiation;Minimal  Motor Activity Slow  Appearance/Hygiene Disheveled  Behavior Characteristics Unwilling to participate  Mood Suspicious;Preoccupied  Thought Process  Coherency Disorganized  Content Preoccupation  Delusions None reported or observed  Perception UTA  Hallucination None reported or observed  Judgment Impaired  Confusion Mild  Danger to Self  Current suicidal ideation? Denies

## 2021-12-23 NOTE — Group Note (Signed)
[facility] LCSW Group Therapy Note  Date/Time:  12/23/2021  11:00AM-12:00PM  Type of Therapy and Topic:  Group Therapy:  Music and Mood  Participation Level:  Active   Description of Group: In this process group, members listened to a variety of genres of music and identified that different types of music evoke different responses.  Patients were encouraged to identify music that was soothing for them and music that was energizing for them.  Patients discussed how this knowledge can help with wellness and recovery in various ways including managing depression and anxiety as well as encouraging healthy sleep habits.    Therapeutic Goals: Patients will explore the impact of different varieties of music on mood Patients will verbalize the thoughts they have when listening to different types of music Patients will identify music that is soothing to them as well as music that is energizing to them Patients will discuss how to use this knowledge to assist in maintaining wellness and recovery Patients will explore the use of music as a coping skill  Summary of Patient Progress:  At the beginning of group, patient expressed her mood was okay.  At the end of group, patient expressed her mood was "calm."  She stated that music is nice and makes her feel better, so she can use it in that way after discharge.  Therapeutic Modalities: Solution Focused Brief Therapy Activity   Ambrose Mantle, LCSW

## 2021-12-23 NOTE — Progress Notes (Signed)
[facility] MD Progress Note  12/23/2021 7:09 AM Natalie Brown  MRN:  [id number]  Reason for Admission:  Natalie Brown is a 56 y.o. female  who was initially admitted for inpatient psychiatric hospitalization on 09/06/2021 for management of mania, disorganized behaviors/thinking, and delusions. The patient is currently on Hospital Day 108.   Chart Review from last 24 hours:  The patient's chart was reviewed and nursing notes were reviewed. The patient's case was discussed in multidisciplinary team meeting. Per nursing, she remains disorganized and disheveled. She attended groups. Per MAR she did not require PRNs other than atropine gtt for drooling. She was compliant with scheduled medications.   Information Obtained Today During Patient Interview: The patient was interviewed on the unit. She denies SI, HI, AVH, first rank symptoms, or ideas of reference. She states she has had some diarrhea this morning and I discussed holding her Colace. She reports good sleep and appetite and voices no other physical complaints. She states her sister visited and it went well but she does not disclose what they talked about. [name] fixated today on desire to leave and live with her sister after discharge. She was again reminded she has a guardian and no stable housing in place and that we are waiting on Medicaid but she continues to be confused about this. She states her mood is "okay." She continues to express belief that she can speak multiple languages when asked but does not make unprompted delusional statements.   Called Guardian 9/3: Spoke to mother and updated her on patient's care. Mother made aware that she showered on Friday. Made guardian aware that the patient is ruminative about discharge. Discussed that she seems more alert and has not acutely decompensated with recent medication changes. Mother states she has received new forms to fill out for SSI which she is working on. Mother had questions about her  medication print out she had been given and questions were answered. Mother questioned PRNs that had been given recently for anxiety and agitation and I advised that she had required PRN zyprexa X1 and several nights of PRN Ativan for agitation this week but did not require any PRNs last night. Time given for questions.  Principal Problem: Bipolar affective disorder, current episode manic with psychotic symptoms (Natalie Brown) Diagnosis: Principal Problem:   Bipolar affective disorder, current episode manic with psychotic symptoms (Natalie Brown) Active Problems:   Major neurocognitive disorder (Natalie Brown)  Past Psychiatric History: see H&P  Past Medical History:  Past Medical History:  Diagnosis Date   Bipolar 1 disorder with moderate mania (Natalie Brown) 12/30/2013   Hypertension    Family History: see H&P  Family Psychiatric  History: see H&P  Social History:  Homeless; mother is interim guardian   Sleep: total time unrecorded  Appetite:  Good  Current Medications: Current Facility-Administered Medications  Medication Dose Route Frequency Provider Last Rate Last Admin   acetaminophen (TYLENOL) tablet 650 mg  650 mg Oral Q6H PRN Briant Cedar, MD   650 mg at 11/14/21 0759   alum & mag hydroxide-simeth (MAALOX/MYLANTA) 200-200-20 MG/5ML suspension 30 mL  30 mL Oral Q4H PRN Massengill, [name], MD       atropine 1 % ophthalmic solution 1 drop  1 drop Sublingual Q8H PRN Winfred Leeds, Nadir, MD   1 drop at 12/22/21 0850   docusate sodium (COLACE) capsule 100 mg  100 mg Oral Daily Nelda Marseille, Rishikesh Khachatryan E, MD   100 mg at 12/22/21 0847   feeding supplement (KATE FARMS STANDARD 1.4)  liquid 325 mL  325 mL Oral BID BM Massengill, [name], MD   325 mL at 12/22/21 0920   fluticasone (FLONASE) 50 MCG/ACT nasal spray 1 spray  1 spray Each Nare Daily PRN Harlow Asa, MD   1 spray at 12/18/21 0941   haloperidol (HALDOL) tablet 1 mg  1 mg Oral Daily Nicholes Rough, NP   1 mg at 12/22/21 0847   haloperidol (HALDOL) tablet 2 mg  2  mg Oral QHS Nicholes Rough, NP   2 mg at 12/22/21 2107   hydrocerin (EUCERIN) cream   Topical BID Janine Limbo, MD   Given at 12/22/21 1653   hydrOXYzine (ATARAX) tablet 25 mg  25 mg Oral TID PRN Janine Limbo, MD   25 mg at 12/17/21 2027   LORazepam (ATIVAN) tablet 1 mg  1 mg Oral Q6H PRN Harlow Asa, MD   1 mg at 12/21/21 2045   magnesium hydroxide (MILK OF MAGNESIA) suspension 30 mL  30 mL Oral Daily PRN Janine Limbo, MD   30 mL at 11/14/21 0802   nicotine (NICODERM CQ - dosed in mg/24 hours) patch 14 mg  14 mg Transdermal Daily Nelda Marseille, Shyheem Whitham E, MD   14 mg at 12/22/21 0848   OLANZapine zydis (ZYPREXA) disintegrating tablet 5 mg  5 mg Oral Q8H PRN Harlow Asa, MD   5 mg at 12/21/21 1027   And   ziprasidone (GEODON) injection 20 mg  20 mg Intramuscular PRN Harlow Asa, MD       ondansetron [facility]) tablet 4 mg  4 mg Oral Q8H PRN Harlow Asa, MD   4 mg at 11/23/21 8469   polyethylene glycol (MIRALAX / GLYCOLAX) packet 17 g  17 g Oral Daily PRN Harlow Asa, MD       propranolol (INDERAL) tablet 10 mg  10 mg Oral BID Harlow Asa, MD   10 mg at 12/22/21 1653   Lab Results:  Results for orders placed or performed during the hospital encounter of 09/06/21 (from the past 48 hour(s))  Urinalysis, Routine w reflex microscopic Urine, Clean Catch     Status: None   Collection Time: 12/21/21 11:02 AM  Result Value Ref Range   Color, Urine YELLOW YELLOW   APPearance CLEAR CLEAR   Specific Gravity, Urine 1.017 1.005 - 1.030   pH 6.0 5.0 - 8.0   Glucose, UA NEGATIVE NEGATIVE mg/dL   Hgb urine dipstick NEGATIVE NEGATIVE   Bilirubin Urine NEGATIVE NEGATIVE   Ketones, ur NEGATIVE NEGATIVE mg/dL   Protein, ur NEGATIVE NEGATIVE mg/dL   Nitrite NEGATIVE NEGATIVE   Leukocytes,Ua NEGATIVE NEGATIVE    Comment: Performed at [facility], [place] [address], [place], [place] [address]     Blood Alcohol level:  Lab Results  Component Value Date    ETH <10 84/13/2440   Metabolic Disorder Labs: Lab Results  Component Value Date   HGBA1C 5.3 09/08/2021   MPG 105.41 09/08/2021   No results found for: "PROLACTIN" Lab Results  Component Value Date   CHOL 173 09/08/2021   TRIG 110 09/08/2021   HDL 50 09/08/2021   CHOLHDL 3.5 09/08/2021   VLDL 22 09/08/2021   LDLCALC 101 (H) 09/08/2021   LDLCALC 102 (H) 06/21/2020   Physical Findings:  AIMS: Facial and Oral Movements Muscles of Facial Expression: None, normal Lips and Perioral Area: None, normal Jaw: None, normal Tongue: None, normal,Extremity Movements Upper (arms, wrists, hands, fingers): None, normal Lower (legs, knees, ankles,  toes): None, normal, Trunk Movements Neck, shoulders, hips: None, normal, Overall Severity Severity of abnormal movements (highest score from questions above): None, normal Incapacitation due to abnormal movements: None, normal Patient's awareness of abnormal movements (rate only patient's report): No Awareness, Dental Status Current problems with teeth and/or dentures?: No Does patient usually wear dentures?: No   Musculoskeletal: Strength & Muscle Tone: normal Gait & Station: normal Patient leans: N/A  Psychiatric Specialty Exam:  Presentation  General Appearance: disheveled appearing; wearing dirty clothes and hair bonnet  Eye Contact:Fair  Speech: some speech delay noted at times during interview but overall normal word fluency   Speech Volume:Normal  Mood and Affect  Mood:described as "good" appears anxious when discussing discharge desire  Affect: mildly anxious, polite  Thought Process  Thought Processes: concrete and ruminative about desire for discharge  Orientation:Oriented to month, date, city and Software engineer, not month or situation  Thought Content: Has residual delusions but does not mention without prompts; she denies SI, HI, AVH, paranoia, ideas of reference of first rank symptoms - is not grossly responding to  internal stimuli on exam - has some speech hesitancy but not thought blocking on exam  Hallucinations:Denied  Ideas of Reference:Denied  Suicidal Thoughts:Denied   Homicidal Thoughts:Denied   Sensorium  Memory:Poor  Judgment:Poor  Insight:Poor  Executive Functions  Concentration:Poor -  Attention Span:Poor  Recall:Poor  Fund of Knowledge:Limited  Language:Fair  Psychomotor Activity  Psychomotor Activity:no restlessness, akathisia or tremors noted and no stiffness or cogwheeling with testing today; AIMS 0  Assets  Assets:social support, guardianship, resilience  Physical Exam Vitals and nursing note reviewed.  Constitutional:      Appearance: Normal appearance.  HENT:     Head: Normocephalic.     Mouth/Throat:     Comments: Less drooling today Pulmonary:     Effort: Pulmonary effort is normal.  Neurological:     General: No focal deficit present.     Mental Status: [name] alert.    Review of Systems  Respiratory:  Negative for shortness of breath.   Cardiovascular:  Negative for chest pain and palpitations.  Gastrointestinal:  Positive for diarrhea. Negative for abdominal pain, constipation, nausea and vomiting.  Neurological: Negative.  Negative for dizziness and headaches.   Blood pressure 115/78, pulse (!) 105, temperature 98.2 F (36.8 C), temperature source Oral, resp. rate 16, height _0  (1.676 m), weight 72.6 kg, SpO2 100 %. Body mass index is 25.83 kg/m.  Treatment Plan Summary:  Diagnoses / Active Problems: Bipolar I MRE manic with psychotic features Major Neurocognitive d/o with behavioral disturbance  PLAN: Safety and Monitoring:  -- Voluntary admission (signed in by guardian for treatment on 11/19/21) to inpatient psychiatric unit for safety, stabilization and treatment  -- Daily contact with patient to assess and evaluate symptoms and progress in treatment  -- Patient's case to be discussed in multi-disciplinary team meeting  --  Observation Level : q15 minute checks  -- Vital signs:  q12 hours  -- Precautions: suicide, elopement, and assault  -- Family meeting held 9/1  2. Psychiatric Diagnoses and Treatment:   Bipolar I MRE manic with psychotic features  Major neurocognitive disorder with behavioral disturbance (marked for age brain atrophy on CT head and MOCA 11/30)   -Continue Haldol 19m qam and 235mqhs with attempt to taper off as tolerated - Due to confusion, stopped Cogentin 9/1 to see if anticholinergic effects were worsening confusion and monitor for EPS with dose reduction in Haldol  - Continue atropine  ophthalmologic gtts 1 drop q12 hour SL PRN drooling  - Continue Inderal 49m bid for help with anxiety and tachycardia  - Stopped Aricept 550m9/1 in the event she has frontotemporal dementia that is being worsened with Brown -InLorayne Benderustenna 234 mg IM given on 09-30-21, 15617mM given 10/12/20 - Invega 78 mg given on 7/21; Invega 27m49mven 8/18 - next due 9/15 -- Zyprexa/Geodon/ativan agitation protocol PRN (reduced Ativan to 0.5mg 51m from 1mg) 62m-Additional psychosis labs (HIV- , RPR-, ANA-, heavy metal WNL, ESR wnl, ceruloplasmin wnl and CT head shows Marked for age brain atrophy.  No acute or reversible finding.)   --Depakote, Ativan, and Zyprexa stopped -- Interim guardianship in place with mother and waiting on SSDI and medicaid for group home referral -- working on prompts to allow staff to assist with ADLs and hygiene - Given recent increased confusion, checked labs 8/31 and 9/1 CBC WNL (afebrile), CMP WNL other than creatinine 1.19 GFR 54 and UA negative  3. Medical Issues Being Addressed:   Tobacco Use Disorder  -- Nicotine patch 14mg/215murs ordered and d/c nicotine gum    Hyperammonemia -resolved --Ammonia level 30 on 10/27/21 and 25 on 8/4 --Depakote was stopped     Elevated creatinine - improved --Repeat 8/31 1.19 with GFR 54 -- Continue to encourage po fluids and trend     Seasonal  allergies --Restart Flonase PRN per guardian approval  Diarrhea -- Change Colace to PRN daily for return of constipation  Nausea/Vomiting -resolved -- SLP saw for swallow assessment and feels is cognitively based dysphagia - no limitations continue regular diet and thin liquid -- monitoring to help with swallowing/portion control   Tachycardia  -- Rechecking EKG and starting Inderal 10mg bi60mR 83-105)  Hypokalemia - resolved - Received K+ 20 meq bid and repeat BMP 8/18 shows K+ 4.0 and 8/31 K+ 4.3  4. Discharge Planning:   -- Social work and case management to assist with discharge planning and identification of hospital follow-up needs prior to discharge  -- Discharge Concerns: Need to establish a safety plan; Medication compliance and effectiveness; safe housing   -- Discharge Goals: ALF or group home with outpatient referrals for mental health follow-up including medication management/psychotherapy  Labs On Admission- CMP: WNL except Cr: 1.1,  GFR: 60,  CBC: WNL,  UDS: Neg,  Troponin: 3,  EtOH: Neg,  beta hCG: 6.4,  Resp Panel: Neg 5/29- Urine Preg: Neg,  A1c: 5.3,  TSH: 1.719,  Lipid Panel: WNL except LDL: 101 5/24- BMP: WNL except Cr: 1.01,  Ca: 8.4,  hCG: 5,  Urine Preg: Neg 5/28- Ammonia: 32,  CBC: WNL,  CMP: WNL except  Cr: 1.08,  Ca: 8.6,  Depakote lvl: 20 6/3- Depakote lvl: 64, Hep Function: WNL,  CBC: WNL except Hem: 11.9 6/7- Depakote lvl: 75 6/18- UA: Neg,  CBC: WNL,  Depakote lvl: 136,  Ammonia: 99,  BMP: WNL except BUN: 22,  Cr: 1.19,  Ca: 8.6,  GFR: 54,  Hep Function: WNL except Albu: 3.3,  Indirect Bili: 0.2 6/20- ANA: Neg,  Ammonia: 48,  Ceruloplasmin: 24.2,  CMP: WNL except BUN: 23,  Cr: 1.35,  Ca: 8.3,  Albu: 3.4  HIV:Neg,  RPR: Neg,  Sed Rate: 6,  Depakote lvl: 25,  Heavy Metal: Neg 6/23- Ammonia: 46,  BMP: WNL except Cr: 1.2,  Ca: 8.3,  GFR: 54 6/24- UA: WNL except Bacteria: rare 6/30- CMP:  WNL except Cr: 1.04,  Ca: 8.6,  CBC: WNL,  Ammonia: 41, EKG: NSR with  Qtc: 399 7/8 - CMP WNL other than CR 1.33 and GFR 48; Ammonia 30 7/18 - Cr of 1.22, eGFR of 53 7/23 - EKG with Qtc of 422 7/31 - creatinine 1.08 8/1 - creatinine 1.24 8/3 - COVID negative 8/4 - CBC WNL, NH3 25, CMP WNL other than creatinine 1.05 and glucose 122, lipase WNL 8/6 - CXR negative and COVID negative 8/16 - Creatinine 1.42 with GFR 44, glucose 146 and K+ 3.4 otherwise BMP WNL 8/18 - Creatinine 1.19 with GFR 54, glucose 110, and CL 112 and K+ 4.0 8/31 - Creatinine 1.19 with GFR 54 otherwise CMP WNL; CBC WNL 9/1 - UA negative  Harlow Asa, MD, FAPA 12/23/2021, 7:09 AM

## 2021-12-23 NOTE — Progress Notes (Signed)
   12/23/21 0800  Psych Admission Type (Psych Patients Only)  Admission Status Involuntary  Psychosocial Assessment  Patient Complaints Anxiety;Irritability  Eye Contact Brief  Facial Expression Flat  Affect Appropriate to circumstance  Speech Soft  Interaction No initiation  Motor Activity Slow  Appearance/Hygiene Disheveled  Behavior Characteristics Cooperative;Fidgety  Mood Preoccupied  Thought Process  Coherency Disorganized  Content Preoccupation  Delusions None reported or observed  Perception UTA  Hallucination None reported or observed  Judgment Impaired  Confusion Moderate  Danger to Self  Agreement Not to Harm Self Yes  Description of Agreement Verbal contract  Danger to Others  Danger to Others None reported or observed  Danger to Others Abnormal  Harmful Behavior to others No threats or harm toward other people  Destructive Behavior No threats or harm toward property

## 2021-12-23 NOTE — [facility] Group Notes (Signed)
Adult Psychoeducational Group Note Date:  12/22/2021 Time:  0900-1045 Group Topic/Focus: PROGRESSIVE RELAXATION. A group where deep breathing is taught and tensing and relaxation muscle groups is used. Imagery is used as well.  Pts are asked to imagine 3 pillars that hold them up when they are not able to hold themselves up and to share that with the group.  Participation Level:  did not attend   Danile Trier A   

## 2021-12-23 NOTE — Plan of Care (Signed)
?  Problem: Activity: ?Goal: Interest or engagement in activities will improve ?Outcome: Progressing ?Goal: Sleeping patterns will improve ?Outcome: Progressing ?  ?Problem: Coping: ?Goal: Ability to verbalize frustrations and anger appropriately will improve ?Outcome: Progressing ?Goal: Ability to demonstrate self-control will improve ?Outcome: Progressing ?  ?Problem: Safety: ?Goal: Periods of time without injury will increase ?Outcome: Progressing ?  ?

## 2021-12-23 NOTE — Progress Notes (Signed)
Adult Psychoeducational Group Note  Date:  12/23/2021 Time:  9:26 PM  Group Topic/Focus:  Wrap-Up Group:   The focus of this group is to help patients review their daily goal of treatment and discuss progress on daily workbooks.  Participation Level:  Did Not Attend  Participation Quality:   n/a  Affect:   n/a  Cognitive:   n/a  Insight: None  Engagement in Group:   n/a  Modes of Intervention:   n/a  Additional Comments:   Pt did not attend the Wrap-Up group.  Edmund Hilda Nikolai Wilczak 12/23/2021, 9:26 PM

## 2021-12-24 ENCOUNTER — Inpatient Hospital Stay (HOSPITAL_COMMUNITY)
Admission: RE | Admit: 2021-12-24 | Discharge: 2023-01-22 | DRG: 070 | Disposition: A | Discharge status: Home / Self Care | Payer: MEDICAID | Source: Intra-hospital | Attending: Internal Medicine | Admitting: Internal Medicine

## 2021-12-24 ENCOUNTER — Observation Stay (HOSPITAL_COMMUNITY): Payer: MEDICAID

## 2021-12-24 ENCOUNTER — Encounter (HOSPITAL_COMMUNITY): Payer: Self-pay | Admitting: Psychiatry

## 2021-12-24 ENCOUNTER — Encounter (HOSPITAL_COMMUNITY): Payer: Self-pay

## 2021-12-24 ENCOUNTER — Other Ambulatory Visit: Payer: Self-pay

## 2021-12-24 DIAGNOSIS — Z7401 Bed confinement status: Secondary | ICD-10-CM

## 2021-12-24 DIAGNOSIS — K117 Disturbances of salivary secretion: Secondary | ICD-10-CM | POA: Diagnosis not present

## 2021-12-24 DIAGNOSIS — Z79899 Other long term (current) drug therapy: Secondary | ICD-10-CM

## 2021-12-24 DIAGNOSIS — Z538 Procedure and treatment not carried out for other reasons: Secondary | ICD-10-CM | POA: Diagnosis not present

## 2021-12-24 DIAGNOSIS — Z111 Encounter for screening for respiratory tuberculosis: Secondary | ICD-10-CM

## 2021-12-24 DIAGNOSIS — N1832 Chronic kidney disease, stage 3b: Secondary | ICD-10-CM | POA: Diagnosis present

## 2021-12-24 DIAGNOSIS — Z720 Tobacco use: Secondary | ICD-10-CM

## 2021-12-24 DIAGNOSIS — Z9181 History of falling: Secondary | ICD-10-CM

## 2021-12-24 DIAGNOSIS — R7989 Other specified abnormal findings of blood chemistry: Secondary | ICD-10-CM | POA: Diagnosis not present

## 2021-12-24 DIAGNOSIS — F0392 Unspecified dementia, unspecified severity, with psychotic disturbance: Secondary | ICD-10-CM | POA: Diagnosis present

## 2021-12-24 DIAGNOSIS — G9341 Metabolic encephalopathy: Principal | ICD-10-CM | POA: Diagnosis present

## 2021-12-24 DIAGNOSIS — S01112A Laceration without foreign body of left eyelid and periocular area, initial encounter: Secondary | ICD-10-CM | POA: Diagnosis not present

## 2021-12-24 DIAGNOSIS — Z66 Do not resuscitate: Secondary | ICD-10-CM | POA: Diagnosis not present

## 2021-12-24 DIAGNOSIS — G9340 Encephalopathy, unspecified: Secondary | ICD-10-CM

## 2021-12-24 DIAGNOSIS — R001 Bradycardia, unspecified: Secondary | ICD-10-CM | POA: Diagnosis not present

## 2021-12-24 DIAGNOSIS — I952 Hypotension due to drugs: Secondary | ICD-10-CM | POA: Diagnosis not present

## 2021-12-24 DIAGNOSIS — R0989 Other specified symptoms and signs involving the circulatory and respiratory systems: Secondary | ICD-10-CM | POA: Diagnosis not present

## 2021-12-24 DIAGNOSIS — E538 Deficiency of other specified B group vitamins: Secondary | ICD-10-CM | POA: Diagnosis present

## 2021-12-24 DIAGNOSIS — Z23 Encounter for immunization: Secondary | ICD-10-CM

## 2021-12-24 DIAGNOSIS — E43 Unspecified severe protein-calorie malnutrition: Secondary | ICD-10-CM | POA: Diagnosis not present

## 2021-12-24 DIAGNOSIS — Z681 Body mass index (BMI) 19 or less, adult: Secondary | ICD-10-CM

## 2021-12-24 DIAGNOSIS — Z5971 Insufficient health insurance coverage: Secondary | ICD-10-CM

## 2021-12-24 DIAGNOSIS — W1830XA Fall on same level, unspecified, initial encounter: Secondary | ICD-10-CM | POA: Diagnosis not present

## 2021-12-24 DIAGNOSIS — F17200 Nicotine dependence, unspecified, uncomplicated: Secondary | ICD-10-CM | POA: Diagnosis present

## 2021-12-24 DIAGNOSIS — F312 Bipolar disorder, current episode manic severe with psychotic features: Secondary | ICD-10-CM | POA: Diagnosis present

## 2021-12-24 DIAGNOSIS — G249 Dystonia, unspecified: Secondary | ICD-10-CM | POA: Diagnosis not present

## 2021-12-24 DIAGNOSIS — E872 Acidosis, unspecified: Secondary | ICD-10-CM | POA: Diagnosis not present

## 2021-12-24 DIAGNOSIS — T50916A Underdosing of multiple unspecified drugs, medicaments and biological substances, initial encounter: Secondary | ICD-10-CM | POA: Diagnosis not present

## 2021-12-24 DIAGNOSIS — F05 Delirium due to known physiological condition: Secondary | ICD-10-CM | POA: Diagnosis present

## 2021-12-24 DIAGNOSIS — R270 Ataxia, unspecified: Secondary | ICD-10-CM | POA: Diagnosis not present

## 2021-12-24 DIAGNOSIS — Z515 Encounter for palliative care: Secondary | ICD-10-CM

## 2021-12-24 DIAGNOSIS — Z751 Person awaiting admission to adequate facility elsewhere: Secondary | ICD-10-CM

## 2021-12-24 DIAGNOSIS — F0393 Unspecified dementia, unspecified severity, with mood disturbance: Secondary | ICD-10-CM | POA: Diagnosis present

## 2021-12-24 DIAGNOSIS — R000 Tachycardia, unspecified: Secondary | ICD-10-CM | POA: Diagnosis present

## 2021-12-24 DIAGNOSIS — F03918 Unspecified dementia, unspecified severity, with other behavioral disturbance: Principal | ICD-10-CM | POA: Diagnosis present

## 2021-12-24 DIAGNOSIS — F03911 Unspecified dementia, unspecified severity, with agitation: Secondary | ICD-10-CM | POA: Diagnosis present

## 2021-12-24 DIAGNOSIS — Z91199 Patient's noncompliance with other medical treatment and regimen due to unspecified reason: Secondary | ICD-10-CM

## 2021-12-24 DIAGNOSIS — Z781 Physical restraint status: Secondary | ICD-10-CM

## 2021-12-24 DIAGNOSIS — Z5900 Homelessness unspecified: Secondary | ICD-10-CM

## 2021-12-24 DIAGNOSIS — B974 Respiratory syncytial virus as the cause of diseases classified elsewhere: Secondary | ICD-10-CM | POA: Diagnosis not present

## 2021-12-24 DIAGNOSIS — E860 Dehydration: Secondary | ICD-10-CM | POA: Diagnosis present

## 2021-12-24 DIAGNOSIS — F0394 Unspecified dementia, unspecified severity, with anxiety: Secondary | ICD-10-CM | POA: Diagnosis present

## 2021-12-24 DIAGNOSIS — T447X5A Adverse effect of beta-adrenoreceptor antagonists, initial encounter: Secondary | ICD-10-CM | POA: Diagnosis not present

## 2021-12-24 DIAGNOSIS — Z880 Allergy status to penicillin: Secondary | ICD-10-CM

## 2021-12-24 DIAGNOSIS — K5900 Constipation, unspecified: Secondary | ICD-10-CM | POA: Diagnosis not present

## 2021-12-24 DIAGNOSIS — R64 Cachexia: Secondary | ICD-10-CM | POA: Diagnosis not present

## 2021-12-24 DIAGNOSIS — G319 Degenerative disease of nervous system, unspecified: Secondary | ICD-10-CM | POA: Diagnosis present

## 2021-12-24 DIAGNOSIS — E876 Hypokalemia: Secondary | ICD-10-CM | POA: Diagnosis not present

## 2021-12-24 DIAGNOSIS — N179 Acute kidney failure, unspecified: Secondary | ICD-10-CM | POA: Diagnosis not present

## 2021-12-24 DIAGNOSIS — U071 COVID-19: Secondary | ICD-10-CM | POA: Diagnosis not present

## 2021-12-24 DIAGNOSIS — T434X5A Adverse effect of butyrophenone and thiothixene neuroleptics, initial encounter: Secondary | ICD-10-CM | POA: Diagnosis not present

## 2021-12-24 DIAGNOSIS — E870 Hyperosmolality and hypernatremia: Secondary | ICD-10-CM | POA: Diagnosis not present

## 2021-12-24 DIAGNOSIS — R4182 Altered mental status, unspecified: Secondary | ICD-10-CM | POA: Diagnosis present

## 2021-12-24 DIAGNOSIS — I129 Hypertensive chronic kidney disease with stage 1 through stage 4 chronic kidney disease, or unspecified chronic kidney disease: Secondary | ICD-10-CM | POA: Diagnosis present

## 2021-12-24 DIAGNOSIS — E875 Hyperkalemia: Secondary | ICD-10-CM | POA: Diagnosis not present

## 2021-12-24 DIAGNOSIS — I10 Essential (primary) hypertension: Secondary | ICD-10-CM | POA: Diagnosis present

## 2021-12-24 DIAGNOSIS — Y92232 Corridor of hospital as the place of occurrence of the external cause: Secondary | ICD-10-CM | POA: Diagnosis not present

## 2021-12-24 HISTORY — DX: Delirium due to known physiological condition: F05

## 2021-12-24 HISTORY — DX: Tobacco use: Z72.0

## 2021-12-24 LAB — CBC WITH DIFFERENTIAL/PLATELET
Abs Immature Granulocytes: 0.01 10*3/uL (ref 0.00–0.07)
Basophils Absolute: 0 10*3/uL (ref 0.0–0.1)
Basophils Relative: 1 %
Eosinophils Absolute: 0.1 10*3/uL (ref 0.0–0.5)
Eosinophils Relative: 2 %
HCT: 45.5 % (ref 36.0–46.0)
Hemoglobin: 14.9 g/dL (ref 12.0–15.0)
Immature Granulocytes: 0 %
Lymphocytes Relative: 29 %
Lymphs Abs: 1.6 10*3/uL (ref 0.7–4.0)
MCH: 30.3 pg (ref 26.0–34.0)
MCHC: 32.7 g/dL (ref 30.0–36.0)
MCV: 92.5 fL (ref 80.0–100.0)
Monocytes Absolute: 0.2 10*3/uL (ref 0.1–1.0)
Monocytes Relative: 4 %
Neutro Abs: 3.5 10*3/uL (ref 1.7–7.7)
Neutrophils Relative %: 64 %
Platelets: 279 10*3/uL (ref 150–400)
RBC: 4.92 MIL/uL (ref 3.87–5.11)
RDW: 13.1 % (ref 11.5–15.5)
WBC: 5.5 10*3/uL (ref 4.0–10.5)
nRBC: 0 % (ref 0.0–0.2)

## 2021-12-24 LAB — COMPREHENSIVE METABOLIC PANEL
ALT: 16 U/L (ref 0–44)
AST: 28 U/L (ref 15–41)
Albumin: 3.7 g/dL (ref 3.5–5.0)
Alkaline Phosphatase: 55 U/L (ref 38–126)
Anion gap: 13 (ref 5–15)
BUN: 12 mg/dL (ref 6–20)
CO2: 24 mmol/L (ref 22–32)
Calcium: 9.7 mg/dL (ref 8.9–10.3)
Chloride: 105 mmol/L (ref 98–111)
Creatinine, Ser: 1.13 mg/dL — ABNORMAL HIGH (ref 0.44–1.00)
GFR, Estimated: 58 mL/min — ABNORMAL LOW (ref >60)
Glucose, Bld: 99 mg/dL (ref 70–99)
Potassium: 4.7 mmol/L (ref 3.5–5.1)
Sodium: 142 mmol/L (ref 135–145)
Total Bilirubin: 0.6 mg/dL (ref 0.3–1.2)
Total Protein: 7.4 g/dL (ref 6.5–8.1)

## 2021-12-24 LAB — CK: Total CK: 263 U/L — ABNORMAL HIGH (ref 38–234)

## 2021-12-24 LAB — RESP PANEL BY RT-PCR (FLU A&B, COVID) ARPGX2
Influenza A by PCR: NEGATIVE
Influenza B by PCR: NEGATIVE
SARS Coronavirus 2 by RT PCR: NEGATIVE

## 2021-12-24 LAB — LACTIC ACID, PLASMA: Lactic Acid, Venous: 1.3 mmol/L (ref 0.5–1.9)

## 2021-12-24 LAB — AMMONIA: Ammonia: 39 umol/L — ABNORMAL HIGH (ref 9–35)

## 2021-12-24 LAB — VITAMIN B12: Vitamin B-12: 290 pg/mL (ref 180–914)

## 2021-12-24 MED ORDER — HYDROCERIN EX CREA
TOPICAL_CREAM | Freq: Two times a day (BID) | CUTANEOUS | Status: DC
  Administered 2022-01-23 – 2023-01-21 (×14): 1 via TOPICAL
  Filled 2021-12-24 (×8): qty 113

## 2021-12-24 MED ORDER — ALUM & MAG HYDROXIDE-SIMETH 200-200-20 MG/5ML PO SUSP
30.0000 mL | ORAL | Status: DC | PRN
  Filled 2021-12-24: qty 30

## 2021-12-24 MED ORDER — SODIUM CHLORIDE 0.9 % IV SOLN
INTRAVENOUS | Status: DC
  Filled 2021-12-24: qty 1000

## 2021-12-24 MED ORDER — STERILE WATER FOR INJECTION IJ SOLN
INTRAMUSCULAR | Status: AC
  Filled 2021-12-24: qty 10

## 2021-12-24 MED ORDER — HALOPERIDOL LACTATE 5 MG/ML IJ SOLN: 1.0000 mg | Freq: Four times a day (QID) | INTRAMUSCULAR | Status: DC | PRN

## 2021-12-24 MED ORDER — SODIUM CHLORIDE 0.9 % IV SOLN: INTRAVENOUS | Status: DC

## 2021-12-24 MED ORDER — OLANZAPINE 5 MG PO TBDP
5.0000 mg | ORAL_TABLET | Freq: Three times a day (TID) | ORAL | Status: DC | PRN
  Administered 2022-01-17 – 2022-06-23 (×15): 5 mg via ORAL
  Filled 2021-12-24 (×19): qty 1

## 2021-12-24 MED ORDER — ONDANSETRON HCL 4 MG PO TABS
4.0000 mg | ORAL_TABLET | Freq: Three times a day (TID) | ORAL | Status: DC | PRN
  Administered 2022-03-17: 4 mg via ORAL
  Filled 2021-12-24: qty 1

## 2021-12-24 MED ORDER — ENOXAPARIN SODIUM 40 MG/0.4ML IJ SOSY: 40.0000 mg | PREFILLED_SYRINGE | INTRAMUSCULAR | Status: DC

## 2021-12-24 MED ORDER — HYDROXYZINE HCL 25 MG PO TABS
25.0000 mg | ORAL_TABLET | Freq: Three times a day (TID) | ORAL | Status: DC | PRN
  Administered 2021-12-28 – 2022-10-19 (×78): 25 mg via ORAL
  Filled 2021-12-24 (×2): qty 1
  Filled 2021-12-24: qty 3
  Filled 2021-12-24: qty 1
  Filled 2021-12-24: qty 3
  Filled 2021-12-24 (×3): qty 1
  Filled 2021-12-24: qty 3
  Filled 2021-12-24: qty 1
  Filled 2021-12-24 (×6): qty 3
  Filled 2021-12-24: qty 1
  Filled 2021-12-24: qty 3
  Filled 2021-12-24 (×4): qty 1
  Filled 2021-12-24: qty 3
  Filled 2021-12-24: qty 1
  Filled 2021-12-24 (×3): qty 3
  Filled 2021-12-24 (×4): qty 1
  Filled 2021-12-24 (×2): qty 3
  Filled 2021-12-24: qty 1
  Filled 2021-12-24 (×2): qty 3
  Filled 2021-12-24 (×2): qty 1
  Filled 2021-12-24: qty 3
  Filled 2021-12-24 (×3): qty 1
  Filled 2021-12-24: qty 3
  Filled 2021-12-24: qty 1
  Filled 2021-12-24: qty 3
  Filled 2021-12-24: qty 1
  Filled 2021-12-24: qty 3
  Filled 2021-12-24 (×3): qty 1
  Filled 2021-12-24: qty 3
  Filled 2021-12-24 (×2): qty 1
  Filled 2021-12-24 (×2): qty 3
  Filled 2021-12-24: qty 1
  Filled 2021-12-24: qty 3
  Filled 2021-12-24: qty 1
  Filled 2021-12-24: qty 3
  Filled 2021-12-24: qty 1
  Filled 2021-12-24: qty 3
  Filled 2021-12-24: qty 1
  Filled 2021-12-24: qty 3
  Filled 2021-12-24: qty 1
  Filled 2021-12-24 (×4): qty 3
  Filled 2021-12-24: qty 1
  Filled 2021-12-24: qty 3
  Filled 2021-12-24 (×6): qty 1
  Filled 2021-12-24: qty 3
  Filled 2021-12-24: qty 1
  Filled 2021-12-24 (×2): qty 3
  Filled 2021-12-24: qty 1
  Filled 2021-12-24 (×4): qty 3
  Filled 2021-12-24 (×2): qty 1

## 2021-12-24 MED ORDER — ATROPINE SULFATE 1 % OP SOLN
1.0000 [drp] | Freq: Three times a day (TID) | OPHTHALMIC | Status: DC | PRN
Start: 2021-12-24 — End: 2022-01-01

## 2021-12-24 MED ORDER — MAGNESIUM HYDROXIDE 400 MG/5ML PO SUSP
30.0000 mL | Freq: Every day | ORAL | Status: DC | PRN
Start: 2021-12-24 — End: 2022-10-29

## 2021-12-24 MED ORDER — ENOXAPARIN SODIUM 40 MG/0.4ML IJ SOSY
40.0000 mg | PREFILLED_SYRINGE | INTRAMUSCULAR | Status: DC
  Administered 2021-12-25: 40 mg via SUBCUTANEOUS
  Filled 2021-12-24: qty 0.4

## 2021-12-24 MED ORDER — PROPRANOLOL HCL 10 MG PO TABS
10.0000 mg | ORAL_TABLET | Freq: Two times a day (BID) | ORAL | Status: DC
  Administered 2021-12-26 – 2021-12-30 (×10): 10 mg via ORAL
  Filled 2021-12-24 (×14): qty 1

## 2021-12-24 MED ORDER — ZIPRASIDONE MESYLATE 20 MG IM SOLR
20.0000 mg | INTRAMUSCULAR | Status: AC | PRN
  Administered 2022-04-04: 20 mg via INTRAMUSCULAR
  Filled 2021-12-24 (×3): qty 20

## 2021-12-24 MED ORDER — POLYETHYLENE GLYCOL 3350 17 G PO PACK
17.0000 g | PACK | Freq: Every day | ORAL | Status: DC | PRN
Start: 2021-12-24 — End: 2022-01-02

## 2021-12-24 MED ORDER — NICOTINE 7 MG/24HR TD PT24
14.0000 mg | MEDICATED_PATCH | Freq: Every day | TRANSDERMAL | Status: DC
  Administered 2021-12-26 – 2022-02-08 (×43): 14 mg via TRANSDERMAL
  Filled 2021-12-24 (×47): qty 2

## 2021-12-24 MED ORDER — VITAMIN B-12 1000 MCG PO TABS
1000.0000 ug | ORAL_TABLET | Freq: Every day | ORAL | Status: DC
  Administered 2021-12-25: 1000 ug via ORAL
  Filled 2021-12-24 (×2): qty 1

## 2021-12-24 MED ORDER — LORAZEPAM 1 MG PO TABS: 1.0000 mg | ORAL_TABLET | Freq: Four times a day (QID) | ORAL | Status: DC

## 2021-12-24 MED ORDER — FLUTICASONE PROPIONATE 50 MCG/ACT NA SUSP: 1.0000 | Freq: Every day | NASAL | Status: DC | PRN

## 2021-12-24 MED ORDER — KATE FARMS STANDARD 1.4 PO LIQD
325.0000 mL | Freq: Two times a day (BID) | ORAL | Status: DC
  Administered 2021-12-26 – 2022-07-17 (×321): 325 mL via ORAL
  Filled 2021-12-24 (×420): qty 325

## 2021-12-24 MED ORDER — LORAZEPAM 2 MG/ML IJ SOLN
2.0000 mg | Freq: Once | INTRAMUSCULAR | Status: AC
  Administered 2021-12-24: 2 mg via INTRAMUSCULAR
  Filled 2021-12-24: qty 1

## 2021-12-24 MED ORDER — LORAZEPAM 0.5 MG PO TABS
0.5000 mg | ORAL_TABLET | Freq: Four times a day (QID) | ORAL | Status: DC | PRN
  Administered 2021-12-28 – 2022-10-18 (×68): 0.5 mg via ORAL
  Filled 2021-12-24 (×73): qty 1

## 2021-12-24 MED ORDER — LORAZEPAM 0.5 MG PO TABS: 1.0000 mg | ORAL_TABLET | Freq: Four times a day (QID) | ORAL | Status: DC

## 2021-12-24 MED ORDER — ACETAMINOPHEN 325 MG PO TABS
650.0000 mg | ORAL_TABLET | Freq: Four times a day (QID) | ORAL | Status: DC | PRN
  Administered 2021-12-28 – 2022-12-17 (×11): 650 mg via ORAL
  Filled 2021-12-24 (×14): qty 2

## 2021-12-24 MED ORDER — DOCUSATE SODIUM 50 MG PO CAPS: 100.0000 mg | ORAL_CAPSULE | Freq: Every day | ORAL | Status: DC | PRN

## 2021-12-24 MED ORDER — ZIPRASIDONE HCL 20 MG PO CAPS
20.0000 mg | ORAL_CAPSULE | Freq: Once | ORAL | Status: AC
  Administered 2021-12-25: 20 mg via ORAL
  Filled 2021-12-24 (×2): qty 1

## 2021-12-24 NOTE — Plan of Care (Signed)
Loni Beckwith Score for patient obtained prior to ativan challenge:  Immobility/stupor: 1 Mutism: 1 Staring: 2 Posturing/catalepsy: 2 Grimacing: 0 Echopraxia/echolalia: 0 Stereotypy: 0 Mannerism: 1 Verbigeration: 0 Rigidity: 0 Negativism: 3 Waxy Flexibility: 0 Withdrawal: 0 Excitement: 0

## 2021-12-24 NOTE — Assessment & Plan Note (Signed)
Continue nicotine patch  °

## 2021-12-24 NOTE — ED Provider Notes (Signed)
Patient moved into Orange pod on board. I entered chart to review. Patient then moved to green pod. I did not evaluate this patient.   Note: Portions of this report may have been transcribed using voice recognition software. Every effort was made to ensure accuracy; however, inadvertent computerized transcription errors may still be present.    Natalie Brown 12/24/21 1136    Gloris Manchester, MD 12/25/21 [phone]

## 2021-12-24 NOTE — Progress Notes (Signed)
Pt is refusing to let tech touch head. Patient hyper fixated on her bonnet. Explained to pt that I need to get to her head and that she can have her bonnet back once I am done. Patient keeps moving hands.

## 2021-12-24 NOTE — Assessment & Plan Note (Addendum)
56 year old female [facility] for acute change in mental status Seizure vs. Psychiatric vs. Metabolic  -obs to progressive  -frequent neuro checks -neurology consulted -brain Mri with no acute changes, but does have advanced atrophy -overnight EEG -no signs of infectious source at this time, UA pending. Check PCT  -metabolic labs pending -neuro labs for dementia  -swallow screen  -delirium precautions -psychiatry for medication -gentle IVF. CK mildly elevated

## 2021-12-24 NOTE — Consult Note (Signed)
Neurology Consultation  Reason for Consult: Neurological work-up Referring Physician: Tomi Bamberger  CC: Altered mental status  History is obtained from: Chart review, mother  HPI: Natalie Brown is a 56 y.o. female with a past medical history of hypertension and bipolar 1 with moderate mania presenting from behavioral [facility] with concern for an acute change in mental status. The nursing staff noted an episode of staring while being unresponsive to commands and nonverbal. She also had increased confusion and difficulty following simple commands with purposeless body movements with delayed responses today. She was given a 2 mg IM Ativan challenge with minimal improvement in her her speech pattern only.  She still was not able to follow commands and was not cooperative for orientation questioning.  Her mother saw her at the hospital 2 days ago and states that she was able to have a conversation with her however she was not oriented and did perseverate on discharge.  She has been at the behavioral health hospital for 109 days and is reported to have had a progressive decline. On 9/1 her Aricept and cogentin were discontinued and her haldol dosing was reduced. On 9/2 she received PRN zyprexa for agitation and a PRN ativan for anxiety.  Ammonia is 35, CK2 163, B12 290.  Her mother is at the bedside and provides history that Natalie Brown bipolar disorder developed around the age of 33.  She states that she came back from [place] after moving there with a boyfriend and started declining in her mental health.  Her mother suspects that she may have gotten in with the wrong crowd, but she denies knowing about any overt drug or alcohol abuse.  She does note that her housing situation was unstable prior to her admission to the behavioral health hospital.  She does also have a strong family history on the paternal side for dementia.  Her paternal grandfather died from Alzheimer's and her father is reported to have  dementia both starting in their 45s  [id number] to obtain due to altered mental status.   Past Medical History:  Diagnosis Date   Bipolar 1 disorder with moderate mania ([place]) 12/30/2013   Hypertension     History reviewed. No pertinent family history.   Social History:   reports that she has been smoking. She uses smokeless tobacco. She reports that she does not drink alcohol and does not use drugs.  Medications  Current Facility-Administered Medications:    acetaminophen (TYLENOL) tablet 650 mg, 650 mg, Oral, Q6H PRN, Briant Cedar, MD, 650 mg at 11/14/21 0759   alum & mag hydroxide-simeth (MAALOX/MYLANTA) 200-200-20 MG/5ML suspension 30 mL, 30 mL, Oral, Q4H PRN, Massengill, Nathan, MD   atropine 1 % ophthalmic solution 1 drop, 1 drop, Sublingual, Q8H PRN, Winfred Leeds, Nadir, MD, 1 drop at 12/22/21 0850   docusate sodium (COLACE) capsule 100 mg, 100 mg, Oral, Daily PRN, Nelda Marseille, Amy E, MD, 100 mg at 12/24/21 0819   feeding supplement (KATE FARMS STANDARD 1.4) liquid 325 mL, 325 mL, Oral, BID BM, Massengill, Nathan, MD, 325 mL at 12/23/21 1439   fluticasone (FLONASE) 50 MCG/ACT nasal spray 1 spray, 1 spray, Each Nare, Daily PRN, Nelda Marseille, Amy E, MD, 1 spray at 12/18/21 0941   hydrocerin (EUCERIN) cream, , Topical, BID, Massengill, [name], MD, Given at 12/23/21 1800   hydrOXYzine (ATARAX) tablet 25 mg, 25 mg, Oral, TID PRN, Massengill, [name], MD, 25 mg at 12/17/21 2027   LORazepam (ATIVAN) tablet 0.5 mg, 0.5 mg, Oral, Q6H PRN, Nelda Marseille, Amy  E, MD, 0.5 mg at 12/24/21 0847   LORazepam (ATIVAN) tablet 1 mg, 1 mg, Oral, QID, Park Pope, MD   magnesium hydroxide (MILK OF MAGNESIA) suspension 30 mL, 30 mL, Oral, Daily PRN, Massengill, Harrold Donath, MD, 30 mL at 11/14/21 0802   nicotine (NICODERM CQ - dosed in mg/24 hours) patch 14 mg, 14 mg, Transdermal, Daily, Mason Jim, Amy E, MD, 14 mg at 12/24/21 0818   OLANZapine zydis (ZYPREXA) disintegrating tablet 5 mg, 5 mg, Oral, Q8H PRN, 5 mg at  12/21/21 1027 **AND** [COMPLETED] LORazepam (ATIVAN) tablet 1 mg, 1 mg, Oral, PRN, 1 mg at 09/14/21 1254 **AND** ziprasidone (GEODON) injection 20 mg, 20 mg, Intramuscular, PRN, Mason Jim, Amy E, MD   ondansetron (ZOFRAN) tablet 4 mg, 4 mg, Oral, Q8H PRN, Mason Jim, Amy E, MD, 4 mg at 11/23/21 5520   polyethylene glycol (MIRALAX / GLYCOLAX) packet 17 g, 17 g, Oral, Daily PRN, Mason Jim, Amy E, MD   propranolol (INDERAL) tablet 10 mg, 10 mg, Oral, BID, Mason Jim, Amy E, MD, 10 mg at 12/24/21 8022  Current Outpatient Medications:    CLARITIN 10 MG tablet, Take 10 mg by mouth daily., Disp: , Rfl:    fluticasone (FLONASE) 50 MCG/ACT nasal spray, Place 1 spray into both nostrils daily., Disp: , Rfl:    pseudoephedrine-guaifenesin (MUCINEX D) 60-600 MG 12 hr tablet, Take 1 tablet by mouth every 12 (twelve) hours., Disp: 30 tablet, Rfl: 0  Exam: Current vital signs: BP 113/82 (BP Location: Right Arm)   Pulse 92   Temp 98 F (36.7 C) (Oral)   Resp 16   Ht 5\' 6"  (1.676 m)   Wt 72 kg   SpO2 97%   BMI 25.62 kg/m  Vital signs in last 24 hours: Temp:  [98 F (36.7 C)-98.1 F (36.7 C)] 98 F (36.7 C) (09/04 1023) Pulse Rate:  [92-122] 92 (09/04 1023) Resp:  [16] 16 (09/04 1023) BP: (113-122)/(81-82) 113/82 (09/04 1023) SpO2:  [97 %] 97 % (09/04 0631) Weight:  [72 kg] 72 kg (09/04 1142)  GENERAL: Drowsy, alert in NAD HEENT: - Normocephalic and atraumatic, dry mm, no LN++, no Thyromegally LUNGS - Clear to auscultation bilaterally with no wheezes CV - S1S2 RRR, no m/r/g, equal pulses bilaterally. ABDOMEN - Soft, nontender, nondistended with normoactive BS Ext: warm, well perfused, intact peripheral pulses, no edema  NEURO:  Mental Status: Drowsy, awakens to voice, able to state name, age, her mothers name and age. Unable to tell me the date or her situation correctly.  Intermittently agitated and difficult to redirect.  She does fall asleep during exam and requires frequent reorientation and  prompting to complete commands Language: Intermittently pressured speech, does not consistently answer questions appropriately, poor attention and concentration Cranial Nerves: PERRL 5mm/brisk. EOMI, visual fields full, no facial asymmetry, facial sensation intact, hearing intact, tongue/uvula/soft palate midline, normal sternocleidomastoid and trapezius muscle strength. No evidence of tongue atrophy or fibrillations Motor:  Tone: is normal and bulk is normal Sensation- Intact to light touch bilaterally Coordination: movements are slowed with upper extremities. Does not follow commands to move lower extremities.  Gait- deferred  Labs I have reviewed labs in epic and the results pertinent to this consultation are:  CBC    Component Value Date/Time   WBC 5.5 12/24/2021 1314   RBC 4.92 12/24/2021 1314   HGB 14.9 12/24/2021 1314   HGB 13.8 06/21/2020 1106   HCT 45.5 12/24/2021 1314   HCT 40.4 06/21/2020 1106   PLT 279 12/24/2021 1314  PLT 263 06/21/2020 1106   MCV 92.5 12/24/2021 1314   MCV 89 06/21/2020 1106   MCH 30.3 12/24/2021 1314   MCHC 32.7 12/24/2021 1314   RDW 13.1 12/24/2021 1314   RDW 12.8 06/21/2020 1106   LYMPHSABS 1.6 12/24/2021 1314   LYMPHSABS 1.8 06/21/2020 1106   MONOABS 0.2 12/24/2021 1314   EOSABS 0.1 12/24/2021 1314   EOSABS 0.2 06/21/2020 1106   BASOSABS 0.0 12/24/2021 1314   BASOSABS 0.0 06/21/2020 1106    CMP     Component Value Date/Time   NA 141 12/20/2021 1825   NA 145 (H) 06/21/2020 1106   K 4.3 12/20/2021 1825   CL 110 12/20/2021 1825   CO2 26 12/20/2021 1825   GLUCOSE 92 12/20/2021 1825   BUN 14 12/20/2021 1825   BUN 14 06/21/2020 1106   CREATININE 1.19 (H) 12/20/2021 1825   CREATININE 0.97 12/30/2013 1216   CALCIUM 9.1 12/20/2021 1825   PROT 7.2 12/20/2021 1825   PROT 7.0 06/21/2020 1106   ALBUMIN 3.6 12/20/2021 1825   ALBUMIN 4.2 06/21/2020 1106   AST 20 12/20/2021 1825   ALT 14 12/20/2021 1825   ALKPHOS 58 12/20/2021 1825    BILITOT 0.6 12/20/2021 1825   BILITOT 0.3 06/21/2020 1106   GFRNONAA 54 (L) 12/20/2021 1825   GFRNONAA 70 12/30/2013 1216   GFRAA 65 09/19/2017 1000   GFRAA 81 12/30/2013 1216    Lipid Panel     Component Value Date/Time   CHOL 173 09/08/2021 0736   CHOL 181 06/21/2020 1107   TRIG 110 09/08/2021 0736   HDL 50 09/08/2021 0736   HDL 59 06/21/2020 1107   CHOLHDL 3.5 09/08/2021 0736   VLDL 22 09/08/2021 0736   LDLCALC 101 (H) 09/08/2021 0736   LDLCALC 102 (H) 06/21/2020 1107     Imaging I have reviewed the images obtained: MRI examination of the brain 12/24/21 -advanced cerebral and cerebellar atrophy, no acute intracranial abnormality CT head 10/19/21 Marked for age brain atrophy.  No acute or reversible finding.  Assessment: 56 year old female past medical history of hypertension and bipolar 1 with moderate mania presenting from [facility] with an acute change in mental status.  She did have an Ativan challenge for catatonia at the behavioral health hospital with minimal change in mental status.  Infectious work-up has been negative so far, will complete delirium work-up and obtain an EEG to rule out seizure activity as an etiology for her waxing and waning symptoms.  Given her significant atrophy this may represent progression of her dementia, but we will also evaluate for reversible causes of the same.  Given the chronicity of her symptoms I do not feel that the risk of an LP would outweigh the benefits.  It is possible that she was receiving some benefit from her Aricept and discontinuation of this led to some decompensation, however neurology's focus will be on ruling out reversible causes of dementia  Impression: Progression of dementia versus seizure versus metabolic encephalopathy vs polypharmacy   Recommendations: - cEEG - Appreciate management of antipsychotics and mood medications per psychiatry - Delirium/dementia work-up in progress- B1, B12, MMA - Given  low normal B12 will supplement for goal greater than 400.  If MMA results normal this can be discontinued (1000 mcg p.o. daily ordered) - environmental support for delirium: Lights on during the day, patient up and out of bed as much as is feasible, OT/PT, quiet dimly lit room at night, reorient patient often, provide hearing aides and  glasses if patient uses them routinely, minimize sleep disruptions as much as possible overnight.  As much as possible, reorient patient, and have them engage patient in activities, e.g. playing cards. TV should be off or on neutral background music unless patient engaged and watching. Try to keep interactions with the patient calm and quiet.     Patient seen and examined by NP/APP with MD. MD to update note as needed.   Elmer Picker, DNP, FNP-BC [facility] Pager: [phone]  Attending Neurologist's note:  I personally saw this patient, gathering history, performing a full neurologic examination, reviewing relevant labs, personally reviewing relevant imaging including head CT and MRI brain, and formulated the assessment and plan, adding the note above for completeness and clarity to accurately reflect my thoughts  Brooke Dare MD-PhD [facility] [phone] Available 7 AM to 7 PM, outside these hours please contact Neurologist on call listed on AMION

## 2021-12-24 NOTE — Plan of Care (Addendum)
I saw the patient this morning on rounds and she was having difficulty following simple commands, had purposeless body movements (laying in bed but unable to lay flat and picking at covers), shook my hand even when told not to, and delayed verbal responses to questions. She had no waxy flexibility on my assessment and on rigidity. She did have HR 120 at rest. Given concerns for possible catatonia, Ativan 2mg  IM challenge was given with minimal improvement in spontaneity of speech only - she still had trouble following commands but was oriented to place, and month but could not cooperate for other questioning. Due to concerns that she could have aspirated given swallowing issues, could have an acute neurologic event, could have elevated ammonia level again and be encephalopathic, or could have other acute infectious etiology to explain sudden decompensation, will send to ED for medical w/u. EKG and repeat set of vitals ordered. We will hold haldol in the event this is catatonia and will start scheduled Ativan 1mg  qid. MRI brain ordered.   I called the patient's mother/guardian and updated on the patient's clinical picture. I advised that we tried an Ativan challenge IM and she was minimally more verbally responsive after the dose but overall still is more confused and not following commands. In the event this is catatonia, I advised that we will hold Haldol and will start scheduled Ativan 1mg  qid with goal of tapering off over time. I advised that we are sending her to the ED for a medical w/u for worsening confusion to r/o infections etiology, r/o elevated ammonia level again, r/o NMS, or r/o acute neurologic event. Mother understands and given consent for Ativan po dosing. I advised we would update on the patient's care as the day progresses.

## 2021-12-24 NOTE — Progress Notes (Addendum)
[facility] MD Progress Note  12/24/2021 1:58 PM JETTIE MANNOR  MRN:  [id number]  Reason for Admission:  Natalie Brown is a 56 y.o. female  who was initially admitted for inpatient psychiatric hospitalization on 09/06/2021 for management of mania, disorganized behaviors/thinking, and delusions. The patient is currently on Hospital Day 109.   Chart Review from last 24 hours:  The patient's chart was reviewed and nursing notes were reviewed. The patient's case was discussed in multidisciplinary team meeting. Per nursing, patient visited with a boyfriend last night. She remains disorganized and confused. She attended SW group. Per MAR she was compliant with scheduled medications and did not require PRNS.  Information Obtained Today During Patient Interview: The patient was interviewed on the unit in patient's room.   Patient appears more confused and significant delay in response today. Patient has minimal spontaneity of movement and required multiple promptings to answer yes/no questions. Unable to follow directions. Mild mutism, persistent staring, catalepsy, negativism. Denies any acute concerns today but hard to assess. Patient had significant drooling during hospitalization. Was open to brain imaging today if needed. Amenable to ativan challenge due to concern for catatonia. Received 0.5 mg table ativan this morning.   Ativan Challenge:  -Lorazepam IM 2 mg administered 9:48 AM -Patient was monitored for 20 minutes by me -Patient appeared to have slightly less delay in speech latency but no significant improvement -When monitoring, patient had to use restroom. Upon return, it took patient approximately 5 minutes to go from bathroom (that's 10 feet from bed) to bed with stiff posturing. -Took another 5 minutes from sitting on bed to laying in bed -Patient is not back at baseline and minimally improved after ativan challenge   Principal Problem: Bipolar affective disorder, current episode manic  with psychotic symptoms ([place]) Diagnosis: Principal Problem:   Bipolar affective disorder, current episode manic with psychotic symptoms ([place]) Active Problems:   Major neurocognitive disorder ([place])  Past Psychiatric History: see H&P  Past Medical History:  Past Medical History:  Diagnosis Date   Bipolar 1 disorder with moderate mania ([place]) 12/30/2013   Hypertension    Family History: see H&P  Family Psychiatric  History: see H&P  Social History:  Homeless; mother is interim guardian   Sleep: total time unrecorded  Appetite:  Good  Current Medications: Current Facility-Administered Medications  Medication Dose Route Frequency Provider Last Rate Last Admin   acetaminophen (TYLENOL) tablet 650 mg  650 mg Oral Q6H PRN Briant Cedar, MD   650 mg at 11/14/21 0759   alum & mag hydroxide-simeth (MAALOX/MYLANTA) 200-200-20 MG/5ML suspension 30 mL  30 mL Oral Q4H PRN Massengill, [name], MD       atropine 1 % ophthalmic solution 1 drop  1 drop Sublingual Q8H PRN Winfred Leeds, Nadir, MD   1 drop at 12/22/21 0850   docusate sodium (COLACE) capsule 100 mg  100 mg Oral Daily PRN Harlow Asa, MD   100 mg at 12/24/21 0819   feeding supplement (KATE FARMS STANDARD 1.4) liquid 325 mL  325 mL Oral BID BM Massengill, Nathan, MD   325 mL at 12/23/21 1439   fluticasone (FLONASE) 50 MCG/ACT nasal spray 1 spray  1 spray Each Nare Daily PRN Harlow Asa, MD   1 spray at 12/18/21 0941   hydrocerin (EUCERIN) cream   Topical BID Janine Limbo, MD   Given at 12/23/21 1800   hydrOXYzine (ATARAX) tablet 25 mg  25 mg Oral TID PRN Janine Limbo, MD  25 mg at 12/17/21 2027   LORazepam (ATIVAN) tablet 0.5 mg  0.5 mg Oral Q6H PRN Harlow Asa, MD   0.5 mg at 12/24/21 0847   LORazepam (ATIVAN) tablet 1 mg  1 mg Oral QID France Ravens, MD       magnesium hydroxide (MILK OF MAGNESIA) suspension 30 mL  30 mL Oral Daily PRN Massengill, [name], MD   30 mL at 11/14/21 0802   nicotine (NICODERM CQ -  dosed in mg/24 hours) patch 14 mg  14 mg Transdermal Daily Nelda Marseille, Curry Seefeldt E, MD   14 mg at 12/24/21 0818   OLANZapine zydis (ZYPREXA) disintegrating tablet 5 mg  5 mg Oral Q8H PRN Harlow Asa, MD   5 mg at 12/21/21 1027   And   ziprasidone (GEODON) injection 20 mg  20 mg Intramuscular PRN Harlow Asa, MD       ondansetron [facility]) tablet 4 mg  4 mg Oral Q8H PRN Harlow Asa, MD   4 mg at 11/23/21 3557   polyethylene glycol (MIRALAX / GLYCOLAX) packet 17 g  17 g Oral Daily PRN Harlow Asa, MD       propranolol (INDERAL) tablet 10 mg  10 mg Oral BID Harlow Asa, MD   10 mg at 12/24/21 3220   Current Outpatient Medications  Medication Sig Dispense Refill   CLARITIN 10 MG tablet Take 10 mg by mouth daily.     fluticasone (FLONASE) 50 MCG/ACT nasal spray Place 1 spray into both nostrils daily.     pseudoephedrine-guaifenesin (MUCINEX D) 60-600 MG 12 hr tablet Take 1 tablet by mouth every 12 (twelve) hours. 30 tablet 0   Lab Results:  Results for orders placed or performed during the hospital encounter of 09/06/21 (from the past 48 hour(s))  CBC with Differential     Status: None   Collection Time: 12/24/21  1:14 PM  Result Value Ref Range   WBC 5.5 4.0 - 10.5 K/uL   RBC 4.92 3.87 - 5.11 MIL/uL   Hemoglobin 14.9 12.0 - 15.0 g/dL   HCT 45.5 36.0 - 46.0 %   MCV 92.5 80.0 - 100.0 fL   MCH 30.3 26.0 - 34.0 pg   MCHC 32.7 30.0 - 36.0 g/dL   RDW 13.1 11.5 - 15.5 %   Platelets 279 150 - 400 K/uL   nRBC 0.0 0.0 - 0.2 %   Neutrophils Relative % 64 %   Neutro Abs 3.5 1.7 - 7.7 K/uL   Lymphocytes Relative 29 %   Lymphs Abs 1.6 0.7 - 4.0 K/uL   Monocytes Relative 4 %   Monocytes Absolute 0.2 0.1 - 1.0 K/uL   Eosinophils Relative 2 %   Eosinophils Absolute 0.1 0.0 - 0.5 K/uL   Basophils Relative 1 %   Basophils Absolute 0.0 0.0 - 0.1 K/uL   Immature Granulocytes 0 %   Abs Immature Granulocytes 0.01 0.00 - 0.07 K/uL    Comment: Performed at [facility], [address] [place], [place] [address]     Blood Alcohol level:  Lab Results  Component Value Date   ETH <10 10/13/7626   Metabolic Disorder Labs: Lab Results  Component Value Date   HGBA1C 5.3 09/08/2021   MPG 105.41 09/08/2021   No results found for: "PROLACTIN" Lab Results  Component Value Date   CHOL 173 09/08/2021   TRIG 110 09/08/2021   HDL 50 09/08/2021   CHOLHDL 3.5 09/08/2021   VLDL 22 09/08/2021  LDLCALC 101 (H) 09/08/2021   LDLCALC 102 (H) 06/21/2020   Physical Findings:  AIMS: Facial and Oral Movements Muscles of Facial Expression: None, normal Lips and Perioral Area: None, normal Jaw: None, normal Tongue: None, normal,Extremity Movements Upper (arms, wrists, hands, fingers): None, normal Lower (legs, knees, ankles, toes): None, normal, Trunk Movements Neck, shoulders, hips: None, normal, Overall Severity Severity of abnormal movements (highest score from questions above): None, normal Incapacitation due to abnormal movements: None, normal Patient's awareness of abnormal movements (rate only patient's report): No Awareness, Dental Status Current problems with teeth and/or dentures?: No Does patient usually wear dentures?: No   Musculoskeletal: Strength & Muscle Tone: no cogwheeling or rigidity on exam Gait & Station: uncooperative for testing Patient leans: N/A  Psychiatric Specialty Exam:  Presentation  General Appearance: disheveled appearing; wearing dirty clothes and hair bonnet. Laying in bed with odd body posturing at times- laying flat but elevating head - grasping covers and refusing to let go.  Eye Contact:Persistent staring with eyes open - minimal blink  Speech: significant paucity in speech. Low volume to the point of mumbling   Speech Volume:Normal  Mood and Affect  Mood: "fine"- appears acutely confused  Affect: flat  Thought Process  Thought Processes: unable to assess  Orientation: oriented to month and at [facility] - otherwise unable  to assess or answer  Thought Content: unable to assess  Hallucinations:unable to assess  Ideas of Reference:unable to assess  Suicidal Thoughts:unable to assess   Homicidal Thoughts:unable to assess   Sensorium  Memory:unable to assess  Judgment:unable to assess Insight:unable to assess Executive Functions  Concentration:Poor  Attention Span:Poor  Recall:unable to assess  Fund of Knowledge:unable to assess  Language:poor  Psychomotor Activity  Psychomotor Activity: significantly less than normal - purposeless movements at times during exam - picking at band aid on finger, trouble following commands, picking at covers or grasping covers and resisting letting go; shaking my hand even when told not to; no waxy flexibility or stiffness noted with passive movement of BUE, no tremors  Assets  Assets:social support, guardianship, resilience  Physical Exam Vitals and nursing note reviewed.  HENT:     Head: Normocephalic.     Mouth/Throat:     Comments: Significant drooling noted Cardiovascular:     Rate and Rhythm: Tachycardia present.  Pulmonary:     Effort: Pulmonary effort is normal.  Neurological:     Mental Status: [name] alert.     Review of Systems  Unable to perform ROS: Mental status change  Neurological:  Positive for speech change.   Blood pressure 113/82, pulse 92, temperature 98 F (36.7 C), temperature source Oral, resp. rate 16, height 5' 6" (1.676 m), weight 72 kg, SpO2 97 %. Body mass index is 25.62 kg/m.  Treatment Plan Summary:  Diagnoses / Active Problems: Bipolar I MRE manic with psychotic features Major Neurocognitive d/o with behavioral disturbance  PLAN: Safety and Monitoring:  -- Voluntary admission (signed in by guardian for treatment on 11/19/21) to inpatient psychiatric unit for safety, stabilization and treatment  -- Daily contact with patient to assess and evaluate symptoms and progress in treatment  -- Patient's case to be  discussed in multi-disciplinary team meeting  -- Observation Level : q15 minute checks  -- Vital signs:  q12 hours  -- Precautions: suicide, elopement, and assault  -- Family meeting held 9/1  2. Psychiatric Diagnoses and Treatment:  Acute on chronic Encephalopathy This is starkly different from patient's presentation yesterday per progress note  and attending's attestation. Unclear etiology as there have been no major changes to medications. Notably, patient did have tachycardia to 120s today for no apparent reason. Differential is wide including NMS, catatonia, infectious etiology of encephalopathy, hepatic encephalopathy, partial/absence seizure, progression of dementia (although the acute nature of current encephalopathy is concerning), vasculitis -Send to ED for medical clearance. Gave report to Dr. Haviland  -Recommend CK, ammonia for NMS vs hepatic encephalopathy - consider lactic acid, CRP, sed rate, UA, CXR and repeat CBC and CMP -MRI w w/o contrast to r/o other etiologies of acute on chronic encephalopathy (as well as reason for major cognitive decline at such a young age) ordered -Ativan 1 mg qid started in event this represents catatonia and po Haldol held at this time - is loaded with Invega sustenna 78mg monthly with last dose 8/18 -Patient is not back at baseline and minimally improved after ativan challenge. If medically clear, patient can return to [facility]. If requiring hospital admission, please consult psychiatry for management of psychotropic medications.    Bipolar I MRE manic with psychotic features  Major neurocognitive disorder with behavioral disturbance (marked for age brain atrophy on CT head and MOCA 11/30) R/o catatonia   -Holding Haldol 1mg qam and 2mg qhs given current encephalopathy and concern for possible catatonia  - Start Ativan 1mg qid for possible catatonia and placed on fall precautions - may need 1:1  - Minimal response to IM Ativan challenge - Due to  confusion, stopped Cogentin 9/1 to see if anticholinergic effects were worsening confusion and monitor for EPS with dose reduction in Haldol  - Continue atropine ophthalmologic gtts 1 drop q12 hour SL PRN drooling  - Continue Inderal 10mg bid for help with anxiety and tachycardia  - Stopped Aricept 5mg 9/1 in the event she has frontotemporal dementia that is being worsened with med -Invega Sustenna 234 mg IM given on 09-30-21, 156mg IM given 10/12/20 - Invega 78 mg given on 7/21; Invega 78mg given 8/18 - next due 9/15 -- Zyprexa/Geodon/ativan agitation protocol PRN (reduced Ativan to 0.5mg PRN from 1mg)   --Additional psychosis labs (HIV- , RPR-, ANA-, heavy metal WNL, ESR wnl, ceruloplasmin wnl and CT head shows Marked for age brain atrophy.  No acute or reversible finding.)   --Depakote, Ativan, and Zyprexa stopped -- Interim guardianship in place with mother and waiting on SSDI and medicaid for group home referral -- working on prompts to allow staff to assist with ADLs and hygiene  3. Medical Issues Being Addressed:   Tobacco Use Disorder  -- Nicotine patch 14mg/24 hours ordered and d/c nicotine gum    Hyperammonemia -resolved --Ammonia level 30 on 10/27/21 and 25 on 8/4 --Depakote was stopped     Elevated creatinine - improved --Repeat 8/31 1.19 with GFR 54 -- Continue to encourage po fluids and trend     Seasonal allergies --Restart Flonase PRN per guardian approval  Diarrhea -- Change Colace to PRN daily for return of constipation  Nausea/Vomiting -resolved -- SLP saw for swallow assessment and feels is cognitively based dysphagia - no limitations continue regular diet and thin liquid -- monitoring to help with swallowing/portion control   Tachycardia  -- Rechecking EKG and continuing Inderal 10mg bid   Hypokalemia - resolved - Received K+ 20 meq bid and repeat BMP 8/18 shows K+ 4.0 and 8/31 K+ 4.3  4. Discharge Planning:   -- Social work and case management to assist with  discharge planning and identification of hospital follow-up needs prior to   discharge  -- Discharge Concerns: Need to establish a safety plan; Medication compliance and effectiveness; safe housing   -- Discharge Goals: ALF or group home with outpatient referrals for mental health follow-up including medication management/psychotherapy  Labs On Admission- CMP: WNL except Cr: 1.1,  GFR: 60,  CBC: WNL,  UDS: Neg,  Troponin: 3,  EtOH: Neg,  beta hCG: 6.4,  Resp Panel: Neg 5/29- Urine Preg: Neg,  A1c: 5.3,  TSH: 1.719,  Lipid Panel: WNL except LDL: 101 5/24- BMP: WNL except Cr: 1.01,  Ca: 8.4,  hCG: 5,  Urine Preg: Neg 5/28- Ammonia: 32,  CBC: WNL,  CMP: WNL except  Cr: 1.08,  Ca: 8.6,  Depakote lvl: 20 6/3- Depakote lvl: 64, Hep Function: WNL,  CBC: WNL except Hem: 11.9 6/7- Depakote lvl: 75 6/18- UA: Neg,  CBC: WNL,  Depakote lvl: 136,  Ammonia: 99,  BMP: WNL except BUN: 22,  Cr: 1.19,  Ca: 8.6,  GFR: 54,  Hep Function: WNL except Albu: 3.3,  Indirect Bili: 0.2 6/20- ANA: Neg,  Ammonia: 48,  Ceruloplasmin: 24.2,  CMP: WNL except BUN: 23,  Cr: 1.35,  Ca: 8.3,  Albu: 3.4  HIV:Neg,  RPR: Neg,  Sed Rate: 6,  Depakote lvl: 25,  Heavy Metal: Neg 6/23- Ammonia: 46,  BMP: WNL except Cr: 1.2,  Ca: 8.3,  GFR: 54 6/24- UA: WNL except Bacteria: rare 6/30- CMP:  WNL except Cr: 1.04,  Ca: 8.6,  CBC: WNL,  Ammonia: 41, EKG: NSR with Qtc: 399 7/8 - CMP WNL other than CR 1.33 and GFR 48; Ammonia 30 7/18 - Cr of 1.22, eGFR of 53 7/23 - EKG with Qtc of 422 7/31 - creatinine 1.08 8/1 - creatinine 1.24 8/3 - COVID negative 8/4 - CBC WNL, NH3 25, CMP WNL other than creatinine 1.05 and glucose 122, lipase WNL 8/6 - CXR negative and COVID negative 8/16 - Creatinine 1.42 with GFR 44, glucose 146 and K+ 3.4 otherwise BMP WNL 8/18 - Creatinine 1.19 with GFR 54, glucose 110, and CL 112 and K+ 4.0 8/31 - Creatinine 1.19 with GFR 54 otherwise CMP WNL; CBC WNL 9/1 - UA negative  France Ravens, MD 12/24/2021, 1:58  PM

## 2021-12-24 NOTE — Group Note (Signed)
Recreation Therapy Group Note   Group Topic:Self-Esteem  Group Date: 12/24/2021 Start Time: 1000 End Time: 1030 Facilitators: Caroll Rancher, LRT,CTRS Location: [address] Dayroom   Goal Area(s) Addresses:  Patient will identify and write at least one positive trait about themself. Patient will acknowledge the benefit of healthy self-esteem. Patient will endorse understanding of ways to increase self-esteem.   Group Description:   LRT began group session with open dialogue asking the patients to define self-esteem and verbally identify positive qualities and traits people may possess. Patients were then instructed to design a personalized license plate, with words and drawings, representing at least 3 positive things about themselves. Pts were encouraged to include favorites, things they are proud of, what they enjoy doing, and dreams for their future. If a patient had a life motto or a meaningful phase that expressed their life values, pt's were asked to incorporate that into their design as well. Patients were given the opportunity to share their completed work with the group.   Affect/Mood: N/A   Participation Level: Did not attend    Clinical Observations/Individualized Feedback:     Plan: Continue to engage patient in RT group sessions 2-3x/week.   Caroll Rancher, LRT,CTRS 12/24/2021 12:03 PM

## 2021-12-24 NOTE — H&P (Addendum)
History and Physical    Patient: Natalie Brown DOB: [birth year] DOA: (Not on file) DOS: the patient was seen and examined on 12/24/2021 PCP: Lavinia Sharps, NP  Patient coming from: [facility]    Chief Complaint: AMS  HPI: Natalie Brown is a 56 y.o. female with medical history significant of HTN and bipolar 1 with mania who presented to ED from [facility] hospital for AMS. She was given an ativan challenge for catatonia with little improvement. Her mom is at beside and gives history and other parts reviewed from her chart. Per psychiatry she had a change in mental status this morning during rounds. She was having problems following simple commands, had purposeless body movements and delayed verbal responses. She had an elevated HR to 120 at rest. They had concerns for catatonia and was given 2mg  IM ativan challenge with minimal improvement. Due to concerns for her decompensation and acute mental status change she was send to ED for further work up.   [name] lying in bed. Talking to her mom and will talk to me. Some answers do not make sense. She denies any pain, headaches, chest pain, shortness of breath, cough, abdominal pain, N/V/D or dysuria.   She does smoke, does not drink.    ER Course:  vitals: afebrile, bp: 122/81, HR; 122, RR: 16, oxygen: 97% Pertinent labs: covid negative, ammonia: 39, CK: 263,  CXR: Crowding of markings in the lower lung fields may be due to poor inspiration. Left lateral CP angle is indistinct suggesting possible small area of atelectasis/pneumonia and small left pleural effusion. Brain MRI: no acute finding. Advanced cerebral and cerebellar atrophy.  In ED: neurology consulted. TRH asked to admit.   Review of Systems: As mentioned in the history of present illness. All other systems reviewed and are negative. Past Medical History:  Diagnosis Date   Bipolar 1 disorder with moderate mania (HCC) 12/30/2013   Hypertension     No past surgical history on file. Social History:  reports that she has been smoking. She uses smokeless tobacco. She reports that she does not drink alcohol and does not use drugs.  Allergies  Allergen Reactions   Penicillins Itching    No family history on file.  Prior to Admission medications   Medication Sig Start Date End Date Taking? Authorizing Provider  CLARITIN 10 MG tablet Take 10 mg by mouth daily. 04/11/21   [provider]  fluticasone (FLONASE) 50 MCG/ACT nasal spray Place 1 spray into both nostrils daily.    [provider]  pseudoephedrine-guaifenesin (MUCINEX D) 60-600 MG 12 hr tablet Take 1 tablet by mouth every 12 (twelve) hours. 09/03/17   09/05/17, MD    Physical Exam: There were no vitals filed for this visit. General:  Appears calm and comfortable and is in NAD Eyes:  PERRL, EOMI, normal lids, iris ENT:  grossly normal hearing, lips & tongue, mmm; appropriate dentition Neck:  no LAD, masses or thyromegaly; no carotid bruits Cardiovascular:  RRR, no m/r/g. No LE edema.  Respiratory:   CTA bilaterally with no wheezes/rales/rhonchi.  Normal respiratory effort. Abdomen:  soft, NT, ND, NABS Back:   normal alignment, no CVAT Skin:  no rash or induration seen on limited exam Musculoskeletal:  rigid tone. Strength intact.  good ROM, no bony abnormality Lower extremity:  No LE edema.  Limited foot exam with no ulcerations.  2+ distal pulses. Psychiatric:  flat mood/affect. Speech normal, not pressured but doesn't make sense  at times. Alert to self.  Neurologic:  CN 2-12 grossly intact, moves all extremities spontaneously, sensation intact   Radiological Exams on Admission: Independently reviewed - see discussion in A/P where applicable  MR BRAIN WO CONTRAST  Result Date: 12/24/2021 CLINICAL DATA:  Mental status change, unknown cause. Episode of unresponsiveness today. EXAM: MRI HEAD WITHOUT CONTRAST TECHNIQUE: Multiplanar, multiecho  pulse sequences of the brain and surrounding structures were obtained without intravenous contrast. COMPARISON:  Head CT 10/09/2021 FINDINGS: Brain: There is no evidence of an acute infarct, intracranial hemorrhage, mass, midline shift, or extra-axial fluid collection. Moderate cerebral and cerebellar atrophy is greatly advanced for age and unchanged. No significant white matter disease is evident. Vascular: Major intracranial vascular flow voids are preserved. Skull and upper cervical spine: Unremarkable bone marrow signal. Sinuses/Orbits: Unremarkable orbits. Paranasal sinuses and mastoid air cells are clear. Other: None. IMPRESSION: 1. No acute intracranial abnormality. 2. Advanced cerebral and cerebellar atrophy. Electronically Signed   By: Sebastian Ache M.D.   On: 12/24/2021 16:08   DG Chest 2 View  Result Date: 12/24/2021 CLINICAL DATA:  Altered mental status EXAM: CHEST - 2 VIEW COMPARISON:  11/25/2021 FINDINGS: Cardiac size is within normal limits. There is poor inspiration. Crowding of markings in the lower lung fields may be due to poor inspiration. There are no signs of alveolar pulmonary edema. There is increased density in the lateral aspect of left lower lung field. Left lateral CP angle is indistinct. There is no pneumothorax. IMPRESSION: Crowding of markings in the lower lung fields may be due to poor inspiration. Left lateral CP angle is indistinct suggesting possible small area of atelectasis/pneumonia and small left pleural effusion. Electronically Signed   By: Ernie Avena M.D.   On: 12/24/2021 12:53    EKG: Independently reviewed.  NSR with rate 91; nonspecific ST changes with no evidence of acute ischemia   Labs on Admission: I have personally reviewed the available labs and imaging studies at the time of the admission.  Pertinent labs:   covid negative,  ammonia: 39,  CK: 263,   Assessment and Plan: Principal Problem:   Acute encephalopathy Active Problems:   Bipolar  affective disorder, current episode manic with psychotic symptoms (HCC)   Tobacco abuse    Assessment and Plan: * Acute encephalopathy 56 year old female [facility] change in mental status Seizure vs. Psychiatric vs. Metabolic  -obs to progressive  -frequent neuro checks -neurology consulted -brain Mri with no acute changes, but does have advanced atrophy -overnight EEG -no signs of infectious source at this time, UA pending. Check PCT  -metabolic labs pending -neuro labs for dementia  -swallow screen  -delirium precautions -psychiatry for medication -gentle IVF. CK mildly elevated   Bipolar affective disorder, current episode manic with psychotic symptoms [facility]) Per psychiatry. Continued meds from [facility] Prn ativan and geodon.   Tobacco abuse Continue nicotine patch     Advance Care Planning:   Code Status: Full Code   Consults: neurology/psychiatry   DVT Prophylaxis: lovenox   Family Communication: mother at bedside: julia walker   Severity of Illness: The appropriate patient status for this patient is OBSERVATION. Observation status is judged to be reasonable and necessary in order to provide the required intensity of service to ensure the patient's safety. The patient's presenting symptoms, physical exam findings, and initial radiographic and laboratory data in the context of their medical condition is felt to place them at decreased risk for further clinical deterioration.  Furthermore, it is anticipated that the patient will be medically stable for discharge from the hospital within 2 midnights of admission.   Author: Orland Mustard, MD 12/24/2021 8:02 PM  For on call review [email or web address].

## 2021-12-24 NOTE — Plan of Care (Signed)
Attempted to call mother at 4:50 to provide updates based on ED notes and no answer. Per ED records mother is with patient at ED.   Bartholomew Crews, MD, Celene Skeen

## 2021-12-24 NOTE — ED Notes (Signed)
Patient observed being verbally aggressive towards family and staff. Patient attempting to get out of bed and not following commands. Admitting provider notified and order for non-violent restraints entered. Patient placed in soft restraints at this time and remains agitated and disoriented x4.

## 2021-12-24 NOTE — Progress Notes (Signed)
Dar Note: Patient is transferred to [facility] via EMS for medical evaluation.  Patient is alert and oriented to person.  Report given to charge nurse, Patty.

## 2021-12-24 NOTE — ED Notes (Signed)
Patient received 20mg  IM geodon at 1850 d/t agitation during EEG set up. Family and sitter at bedside

## 2021-12-24 NOTE — Assessment & Plan Note (Signed)
-  Nicotine patch 

## 2021-12-24 NOTE — ED Triage Notes (Signed)
Pt to ER via EMS from [facility].  Staff reports pt had an episode today where she was starring straight ahead, unresponsive to commands and nonverbal.  Staff gave 2mg  Ativan IM and pt returned to baseline.  Staff sent pt for medical evaluation.

## 2021-12-24 NOTE — Progress Notes (Signed)
LTM EEG hooked up and running - no initial skin breakdown - push button tested - neuro notified. No [facility] monitoring. Pt in ED.  

## 2021-12-24 NOTE — Assessment & Plan Note (Signed)
Per psychiatry. Continued meds from [facility] Prn ativan and geodon.

## 2021-12-25 ENCOUNTER — Observation Stay (HOSPITAL_COMMUNITY): Payer: MEDICAID

## 2021-12-25 ENCOUNTER — Observation Stay (HOSPITAL_COMMUNITY): Payer: Self-pay

## 2021-12-25 ENCOUNTER — Other Ambulatory Visit: Payer: Self-pay

## 2021-12-25 DIAGNOSIS — R000 Tachycardia, unspecified: Secondary | ICD-10-CM

## 2021-12-25 DIAGNOSIS — G9340 Encephalopathy, unspecified: Secondary | ICD-10-CM

## 2021-12-25 DIAGNOSIS — F312 Bipolar disorder, current episode manic severe with psychotic features: Secondary | ICD-10-CM | POA: Diagnosis not present

## 2021-12-25 DIAGNOSIS — R4182 Altered mental status, unspecified: Secondary | ICD-10-CM

## 2021-12-25 LAB — TSH: TSH: 1.371 u[IU]/mL (ref 0.350–4.500)

## 2021-12-25 LAB — BASIC METABOLIC PANEL
Anion gap: 11 (ref 5–15)
BUN: 9 mg/dL (ref 6–20)
CO2: 25 mmol/L (ref 22–32)
Calcium: 9.3 mg/dL (ref 8.9–10.3)
Chloride: 106 mmol/L (ref 98–111)
Creatinine, Ser: 1.05 mg/dL — ABNORMAL HIGH (ref 0.44–1.00)
GFR, Estimated: >60 mL/min (ref >60)
Glucose, Bld: 108 mg/dL — ABNORMAL HIGH (ref 70–99)
Potassium: 3.9 mmol/L (ref 3.5–5.1)
Sodium: 142 mmol/L (ref 135–145)

## 2021-12-25 LAB — CBC
HCT: 41.6 % (ref 36.0–46.0)
Hemoglobin: 14.1 g/dL (ref 12.0–15.0)
MCH: 30.1 pg (ref 26.0–34.0)
MCHC: 33.9 g/dL (ref 30.0–36.0)
MCV: 88.9 fL (ref 80.0–100.0)
Platelets: 266 10*3/uL (ref 150–400)
RBC: 4.68 MIL/uL (ref 3.87–5.11)
RDW: 13.2 % (ref 11.5–15.5)
WBC: 5.3 10*3/uL (ref 4.0–10.5)
nRBC: 0 % (ref 0.0–0.2)

## 2021-12-25 LAB — TROPONIN I (HIGH SENSITIVITY)
Troponin I (High Sensitivity): 6 ng/L (ref <18)
Troponin I (High Sensitivity): 15 ng/L (ref <18)

## 2021-12-25 LAB — CK: Total CK: 256 U/L — ABNORMAL HIGH (ref 38–234)

## 2021-12-25 LAB — D-DIMER, QUANTITATIVE: D-Dimer, Quant: 1.55 ug/mL-FEU — ABNORMAL HIGH (ref 0.00–0.50)

## 2021-12-25 LAB — PROCALCITONIN: Procalcitonin: <0.1 ng/mL

## 2021-12-25 MED ORDER — HALOPERIDOL 1 MG PO TABS
2.0000 mg | ORAL_TABLET | Freq: Every day | ORAL | Status: DC
  Administered 2021-12-26: 2 mg via ORAL
  Filled 2021-12-25 (×2): qty 2

## 2021-12-25 MED ORDER — LACTATED RINGERS IV BOLUS
500.0000 mL | Freq: Once | INTRAVENOUS | Status: AC
  Administered 2021-12-25: 500 mL via INTRAVENOUS

## 2021-12-25 MED ORDER — PROPRANOLOL HCL 10 MG PO TABS: 10.0000 mg | ORAL_TABLET | Freq: Two times a day (BID) | ORAL | Status: DC

## 2021-12-25 MED ORDER — HALOPERIDOL 1 MG PO TABS: 2.0000 mg | ORAL_TABLET | Freq: Every day | ORAL | Status: DC

## 2021-12-25 MED ORDER — HALOPERIDOL 0.5 MG PO TABS: 0.5000 mg | ORAL_TABLET | Freq: Every day | ORAL | Status: DC

## 2021-12-25 MED ORDER — HALOPERIDOL 1 MG PO TABS: 1.0000 mg | ORAL_TABLET | Freq: Every day | ORAL | Status: DC

## 2021-12-25 NOTE — ED Notes (Signed)
Attempted to call report on pt to [facility]. Receiving nurse in other patient's room and will call back

## 2021-12-25 NOTE — ED Notes (Deleted)
  The note originally documented on this encounter has been moved the the encounter in which it belongs.  

## 2021-12-25 NOTE — Progress Notes (Signed)
1754- Market researcher RN stating it better to give report by phone. RN unavailable for report due to pt care in another room.  1819- Attempted to call RN back to receive report. Also messaged through epic chat.

## 2021-12-25 NOTE — ED Notes (Signed)
Called back receiving nurse who stated they do not have the resources to take patient without a sitter and bed was rejected, said to call [facility]. Notified charge nurse

## 2021-12-25 NOTE — Progress Notes (Signed)
PROGRESS NOTE    [name]  XTG:[id number] DOB: May 08, 1965 DOA: 09/06/2021 PCP: Lavinia Sharps, NP  Chief Complaint  Patient presents with   Bipolar D.O.   Altered Mental Status    Brief Narrative:   Natalie Brown is Natalie Brown 56 y.o. female with medical history significant of HTN and bipolar 1 with mania who presented to ED from [facility] hospital for AMS. She was given an ativan challenge for catatonia with little improvement. Her mom is at beside and gives history and other parts reviewed from her chart. Per psychiatry she had Shenicka Sunderlin change in mental status this morning during rounds. She was having problems following simple commands, had purposeless body movements and delayed verbal responses. She had an elevated HR to 120 at rest. They had concerns for catatonia and was given 2mg  IM ativan challenge with minimal improvement. Due to concerns for her decompensation and acute mental status change she was send to ED for further work up.    [name] lying in bed. Talking to her mom and will talk to me. Some answers do not make sense. She denies any pain, headaches, chest pain, shortness of breath, cough, abdominal pain, N/V/D or dysuria.    She does smoke, does not drink.     Assessment & Plan:   Principal Problem:   Bipolar affective disorder, current episode manic with psychotic symptoms (HCC) Active Problems:   Acute encephalopathy   Tachycardia   Bipolar 1 disorder (HCC)   Tobacco abuse   Current smoker   Major neurocognitive disorder (HCC)   Assessment and Plan: Acute encephalopathy 56 year old female [facility] for acute change in mental status MRI brain without acute intracranial abnormality EEG without seizures or epileptiform discharges Neurology recommending b1, b12, mma.  Recommending b12 supplementation for goal >400 (can d/c if MMA wnl). ? Benefit from aricept and d/c leading to decompensation. Delirium precautions  Tachycardia Sinus on  EKG Normal TSH Troponin wnl, d dimer is elevated Follow CT PE protocol Echo pending  Tobacco abuse Nicotine patch   Bipolar 1 disorder (HCC) Per psychiatry     DVT prophylaxis: lovenox Code Status: full Family Communication: none at bedside Disposition:   Status is: Observation The patient remains OBS appropriate and will d/c before 2 midnights.   Consultants:  Psychiatry neurology  Procedures:  EEG  Antimicrobials:  Anti-infectives (From admission, onward)    None       Subjective: Difficult to understand, not complaining of pain or discomfort   Objective: Vitals:   12/25/21 1700 12/25/21 1758 12/25/21 1800 12/25/21 1900  BP: 122/87 125/88 125/88 (!) 124/95  Pulse: (!) 103 (!) 107 (!) 105 (!) 104  Resp: 15 (!) 21 (!) 21 20  Temp: 98.4 F (36.9 C)     TempSrc: Oral     SpO2: 100% 99% 100% 98%  Weight:      Height:        Intake/Output Summary (Last 24 hours) at 12/25/2021 2022 Last data filed at 12/25/2021 1730 Gross per 24 hour  Intake 500 ml  Output --  Net 500 ml   Filed Weights   09/06/21 2100 12/24/21 1142  Weight: 72.6 kg 72 kg    Examination:  General exam: Appears calm and comfortable  Respiratory system: unlabored Cardiovascular system: tachycardic, regular Gastrointestinal system: Abdomen is nondistended, soft and nontender Central nervous system: difficult to understand, says she wants her restraints off, moving all extremities Extremities: no LEE  Data Reviewed: I have personally reviewed following labs and imaging studies  CBC: Recent Labs  Lab 12/20/21 1825 12/24/21 1314 12/25/21 0516  WBC 6.2 5.5 5.3  NEUTROABS 3.6 3.5  --   HGB 13.9 14.9 14.1  HCT 42.1 45.5 41.6  MCV 90.1 92.5 88.9  PLT 302 279 266    Basic Metabolic Panel: Recent Labs  Lab 12/20/21 1825 12/24/21 1507 12/25/21 0516  NA 141 142 142  K 4.3 4.7 3.9  CL 110 105 106  CO2 26 24 25   GLUCOSE 92 99 108*  BUN 14 12 9   CREATININE 1.19* 1.13*  1.05*  CALCIUM 9.1 9.7 9.3    GFR: Estimated Creatinine Clearance: 62.3 mL/min (Sorah Falkenstein) (by C-G formula based on SCr of 1.05 mg/dL (H)).  Liver Function Tests: Recent Labs  Lab 12/20/21 1825 12/24/21 1507  AST 20 28  ALT 14 16  ALKPHOS 58 55  BILITOT 0.6 0.6  PROT 7.2 7.4  ALBUMIN 3.6 3.7    CBG: No results for input(s): "GLUCAP" in the last 168 hours.   Recent Results (from the past 240 hour(s))  Resp Panel by RT-PCR (Flu Kharon Hixon&B, Covid) Anterior Nasal Swab     Status: None   Collection Time: 12/24/21  1:42 PM   Specimen: Anterior Nasal Swab  Result Value Ref Range Status   SARS Coronavirus 2 by RT PCR NEGATIVE NEGATIVE Final    Comment: (NOTE) SARS-CoV-2 target nucleic acids are NOT DETECTED.  The SARS-CoV-2 RNA is generally detectable in upper respiratory specimens during the acute phase of infection. The lowest concentration of SARS-CoV-2 viral copies this assay can detect is 138 copies/mL. Laurabeth Yip negative result does not preclude SARS-Cov-2 infection and should not be used as the sole basis for treatment or other patient management decisions. Verdis Bassette negative result may occur with  improper specimen collection/handling, submission of specimen other than nasopharyngeal swab, presence of viral mutation(s) within the areas targeted by this assay, and inadequate number of viral copies(<138 copies/mL). Neizan Debruhl negative result must be combined with clinical observations, patient history, and epidemiological information. The expected result is Negative.  Fact Sheet for Patients:  02/23/22  Fact Sheet for Healthcare Providers:  02/23/22  This test is no t yet approved or cleared by the [employer] FDA and  has been authorized for detection and/or diagnosis of SARS-CoV-2 by FDA under an Emergency Use Authorization (EUA). This EUA will remain  in effect (meaning this test can be used) for the duration of the COVID-19  declaration under Section 564(b)(1) of the Act, 21 U.S.C.section 360bbb-3(b)(1), unless the authorization is terminated  or revoked sooner.       Influenza Skeeter Sheard by PCR NEGATIVE NEGATIVE Final   Influenza B by PCR NEGATIVE NEGATIVE Final    Comment: (NOTE) The Xpert Xpress SARS-CoV-2/FLU/RSV plus assay is intended as an aid in the diagnosis of influenza from Nasopharyngeal swab specimens and should not be used as Neill Jurewicz sole basis for treatment. Nasal washings and aspirates are unacceptable for Xpert Xpress SARS-CoV-2/FLU/RSV testing.  Fact Sheet for Patients: [employer]it  Fact Sheet for Healthcare Providers: [place]  This test is not yet approved or cleared by the [employer] FDA and has been authorized for detection and/or diagnosis of SARS-CoV-2 by FDA under an Emergency Use Authorization (EUA). This EUA will remain in effect (meaning this test can be used) for the duration of the COVID-19 declaration under Section 564(b)(1) of the Act, 21 U.S.C. section 360bbb-3(b)(1), unless the authorization is terminated or revoked.  Performed  at [facility], [address] [place], [place] [address]          Radiology Studies: Overnight EEG with video  Result Date: 12/25/2021 Charlsie Quest, MD     12/25/2021  9:11 AM Patient Name: Natalie Brown MRN: [id number] Epilepsy Attending: Charlsie Quest Referring Physician/Provider: Orland Mustard, MD Duration: 12/24/2021 1831 to 12/25/2021 0844 Patient history: 56 year old female with altered mental status.  EEG to evaluate for seizure. Level of alertness: Awake AEDs during EEG study: None Technical aspects: This EEG study was done with scalp electrodes positioned according to the 10-20 International system of electrode placement. Electrical activity was reviewed with band pass filter of 1-70Hz , sensitivity of 7 uV/mm, display speed of 57mm/sec with Magon Croson 60Hz  notched filter  applied as appropriate. EEG data were recorded continuously and digitally stored.  Video monitoring was available and reviewed as appropriate. Description: The posterior dominant rhythm consists of 9-10 Hz activity of moderate voltage (25-35 uV) seen predominantly in posterior head regions, symmetric and reactive to eye opening and eye closing. Hyperventilation and photic stimulation were not performed.   IMPRESSION: This study is within normal limits. No seizures or epileptiform discharges were seen throughout the recording. Brissa Asante normal interictal EEG does not exclude nor support the diagnosis of epilepsy.   MR BRAIN WO CONTRAST  Result Date: 12/24/2021 CLINICAL DATA:  Mental status change, unknown cause. Episode of unresponsiveness today. EXAM: MRI HEAD WITHOUT CONTRAST TECHNIQUE: Multiplanar, multiecho pulse sequences of the brain and surrounding structures were obtained without intravenous contrast. COMPARISON:  Head CT 10/09/2021 FINDINGS: Brain: There is no evidence of an acute infarct, intracranial hemorrhage, mass, midline shift, or extra-axial fluid collection. Moderate cerebral and cerebellar atrophy is greatly advanced for age and unchanged. No significant white matter disease is evident. Vascular: Major intracranial vascular flow voids are preserved. Skull and upper cervical spine: Unremarkable bone marrow signal. Sinuses/Orbits: Unremarkable orbits. Paranasal sinuses and mastoid air cells are clear. Other: None. IMPRESSION: 1. No acute intracranial abnormality. 2. Advanced cerebral and cerebellar atrophy. Electronically Signed   By: 10/11/2021 M.D.   On: 12/24/2021 16:08   DG Chest 2 View  Result Date: 12/24/2021 CLINICAL DATA:  Altered mental status EXAM: CHEST - 2 VIEW COMPARISON:  11/25/2021 FINDINGS: Cardiac size is within normal limits. There is poor inspiration. Crowding of markings in the lower lung fields may be due to poor inspiration. There are no signs of alveolar  pulmonary edema. There is increased density in the lateral aspect of left lower lung field. Left lateral CP angle is indistinct. There is no pneumothorax. IMPRESSION: Crowding of markings in the lower lung fields may be due to poor inspiration. Left lateral CP angle is indistinct suggesting possible small area of atelectasis/pneumonia and small left pleural effusion. Electronically Signed   By: 01/25/2022 M.D.   On: 12/24/2021 12:53        Scheduled Meds:  vitamin B-12  1,000 mcg Oral Daily   enoxaparin (LOVENOX) injection  40 mg Subcutaneous Q24H   [START ON 12/26/2021] haloperidol  1 mg Oral Daily   And   haloperidol  2 mg Oral QHS   Continuous Infusions:  sodium chloride Stopped (12/25/21 0029)   sodium chloride Stopped (12/25/21 2355)     LOS: 109 days    Time spent: over 30 min    02/24/22, MD [facility]   To contact the attending provider between 7A-7P or the covering provider during after hours 7P-7A,  please log into the web site [email or web address] and access using universal [place] password for that web site. If you do not have the password, please call the hospital operator.  12/25/2021, 8:22 PM

## 2021-12-25 NOTE — Assessment & Plan Note (Signed)
Per psychiatry 

## 2021-12-25 NOTE — ED Notes (Signed)
CT attempted. PIV blown. IV team called for new IV placement. Sitter now at bedside.

## 2021-12-25 NOTE — Hospital Course (Signed)
[name] is Natalie Brown 56 y.o. female with medical history significant of HTN and bipolar 1 with mania who presented to ED from [facility] hospital for AMS. She was given an ativan challenge for catatonia with little improvement. Her mom is at beside and gives history and other parts reviewed from her chart. Per psychiatry she had Wahid Holley change in mental status this morning during rounds. She was having problems following simple commands, had purposeless body movements and delayed verbal responses. She had an elevated HR to 120 at rest. They had concerns for catatonia and was given 2mg  IM ativan challenge with minimal improvement. Due to concerns for her decompensation and acute mental status change she was send to ED for further work up.    [name] lying in bed. Talking to her mom and will talk to me. Some answers do not make sense. She denies any pain, headaches, chest pain, shortness of breath, cough, abdominal pain, N/V/D or dysuria.    She does smoke, does not drink.

## 2021-12-25 NOTE — Assessment & Plan Note (Signed)
56 year old female [facility] for [facility] change in mental status MRI brain without acute intracranial abnormality EEG without seizures or epileptiform discharges Neurology recommending b1, b12, mma.  Recommending b12 supplementation for goal >400 (can d/c if MMA wnl). ? Benefit from aricept and d/c leading to decompensation. Delirium precautions

## 2021-12-25 NOTE — ED Notes (Signed)
Pt has episodes of staring into space and then coming back to relaity. Speech is slow and pt focuses on things like taking off EEG leads or picking at restraints. Pt oriented to self and sometimes place and situation, at other times is confused. Long EEG still in process

## 2021-12-25 NOTE — Progress Notes (Deleted)
  The note originally documented on this encounter has been moved the the encounter in which it belongs.  

## 2021-12-25 NOTE — Assessment & Plan Note (Signed)
Sinus on EKG Normal TSH Troponin wnl, d dimer is elevated Follow CT PE protocol Echo pending

## 2021-12-25 NOTE — Procedures (Signed)
Patient Name: Natalie Brown  MRN: [id number]  Epilepsy Attending: Charlsie Quest  Referring Physician/Provider: Orland Mustard, MD Duration: 12/24/2021 1831 to 12/25/2021 0844  Patient history: 56 year old female with altered mental status.  EEG to evaluate for seizure.  Level of alertness: Awake  AEDs during EEG study: None  Technical aspects: This EEG study was done with scalp electrodes positioned according to the 10-20 International system of electrode placement. Electrical activity was reviewed with band pass filter of 1-70Hz , sensitivity of 7 uV/mm, display speed of 76mm/sec with a 60Hz  notched filter applied as appropriate. EEG data were recorded continuously and digitally stored.  Video monitoring was available and reviewed as appropriate.  Description: The posterior dominant rhythm consists of 9-10 Hz activity of moderate voltage (25-35 uV) seen predominantly in posterior head regions, symmetric and reactive to eye opening and eye closing. Hyperventilation and photic stimulation were not performed.     IMPRESSION: This study is within normal limits. No seizures or epileptiform discharges were seen throughout the recording.  A normal interictal EEG does not exclude nor support the diagnosis of epilepsy.   Teri Diltz 

## 2021-12-25 NOTE — ED Notes (Signed)
Pt picks at EEG leads and VS monitoring, still in soft restraints. Pt reports feeling sad about recent long-term [facility] hospitalization and being in this situation. Pt became tearful, says "I never thought I would be in this situation." This nurse offered therapeutic listening and therapeutic touch for patient. Diet order obtained and tray ordered.

## 2021-12-25 NOTE — ED Notes (Signed)
IV team at bedside. Pharmacy messaged for missing haldol dose.

## 2021-12-25 NOTE — ED Notes (Signed)
Noticed that pt 's monitor beeping. Pulse now ranging from 120-150. Pt's mentation the same as has been all shift, contacted MD. New orders received and in implementation

## 2021-12-25 NOTE — Progress Notes (Signed)
EEG maint complete.   Fixed Cz F3 Fz Pz Fp1 Fp2 A1 P3 and T7.  All under 10.  No visible skin breakdown.  Patient  is constantly touching her leads.

## 2021-12-25 NOTE — Progress Notes (Deleted)
Neurology Progress Notes Brief HPI They had concerns for catatonia and was given 2mg  IM ativan challenge with minimal improvement. Due to concerns for her decompensation and acute mental status change she was send to ED for further work up.  Natalie Brown is a 56 y.o. female with a past medical history of hypertension and bipolar 1 with moderate mania presenting from [facility] with concern for an acute change in mental status.  Subjective: Seen in her room and still connected to LTM.  [name] in bilateral wrist restraints.  [name] able to answer questions appropriately, but is quick to become agitated  Exam: Vitals:   12/25/21 0522 12/25/21 0926  BP: 109/79 115/76  Pulse: 83 88  Resp: 16 18  Temp: 98 F (36.7 C) 97.6 F (36.4 C)  SpO2: 100% 100%   Gen: In bed, NAD Resp: non-labored breathing, no acute distress Abd: soft, nt  Neuro: NEURO:  Mental Status: Drowsy, awakens to voice, able to state name, age, her mothers name and age. Unable to tell me the date or her situation correctly.  Intermittently agitated and difficult to redirect.  She does fall asleep during exam and requires frequent reorientation and prompting to complete commands Language: Intermittently pressured speech, does not consistently answer questions appropriately, poor attention and concentration Cranial Nerves: PERRL 56mm/brisk. EOMI, visual fields full, no facial asymmetry, facial sensation intact, hearing intact, tongue/uvula/soft palate midline, normal sternocleidomastoid and trapezius muscle strength. No evidence of tongue atrophy or fibrillations Motor:  Tone: is normal and bulk is normal Sensation- Intact to light touch bilaterally DTR: 2+ throughout Coordination: movements are slowed with upper extremities. Does not follow commands to move lower extremities.  Gait- deferred    Pertinent Labs: CK- 256 Ammonia 39 Creatinine 1.05 B12-290  EEG: 12/24/2021 1831 to 12/25/2021 0844 This  study is within normal limits. No seizures or epileptiform discharges were seen throughout the recording.  Impression: Progression of dementia versus seizure versus metabolic encephalopathy vs polypharmacy 56 year old female past medical history of hypertension and bipolar 1 with moderate mania presenting from [facility] with an acute change in mental status.  She did have an Ativan challenge for catatonia at the behavioral health hospital with minimal change in mental status.  Infectious work-up has been negative so far, will complete delirium work-up and obtain an EEG to rule out seizure activity as an etiology for her waxing and waning symptoms.  Given her significant atrophy this may represent progression of her dementia, but we will also evaluate for reversible causes of the same.  Given the chronicity of her symptoms I do not feel that the risk of an LP would outweigh the benefits.  It is possible that she was receiving some benefit from her Aricept and discontinuation of this led to some decompensation, however neurology's focus will be on ruling out reversible causes of dementia   Recommendations: - cEEG - Appreciate management of antipsychotics and mood medications per psychiatry - Delirium/dementia work-up in progress- B1, B12, MMA - Given low normal B12 will supplement for goal greater than 400.  If MMA results normal this can be discontinued (1000 mcg p.o. daily ordered) - environmental support for delirium: Lights on during the day, patient up and out of bed as much as is feasible, OT/PT, quiet dimly lit room at night, reorient patient often, provide hearing aides and glasses if patient uses them routinely, minimize sleep disruptions as much as possible overnight.  As much as possible, reorient patient, and have  them engage patient in activities, e.g. playing cards. TV should be off or on neutral background music unless patient engaged and watching. Try to keep interactions  with the patient calm and quiet.     Patient seen and examined by NP/APP with MD. MD to update note as needed.   Elmer Picker, DNP, FNP-BC [facility] Pager: [phone]

## 2021-12-25 NOTE — Consult Note (Deleted)
  The note originally documented on this encounter has been moved the the encounter in which it belongs.  

## 2021-12-25 NOTE — ED Notes (Signed)
Pt focuses on TV. Still seems disoriented to time and situation. Does not grasp at IV and lines. Asks for lights to be turned off to sleep. This RN remains at doorside with WOW.

## 2021-12-25 NOTE — Consult Note (Signed)
Patient currently admitted under observation status.  Once patient is medically stable and cleared by hospitalist team, she will be able to return to call [facility].  Please see updated note from psychiatric resident.

## 2021-12-26 ENCOUNTER — Observation Stay (HOSPITAL_COMMUNITY): Payer: Self-pay

## 2021-12-26 DIAGNOSIS — I129 Hypertensive chronic kidney disease with stage 1 through stage 4 chronic kidney disease, or unspecified chronic kidney disease: Secondary | ICD-10-CM | POA: Diagnosis present

## 2021-12-26 DIAGNOSIS — R451 Restlessness and agitation: Secondary | ICD-10-CM | POA: Diagnosis not present

## 2021-12-26 DIAGNOSIS — F0392 Unspecified dementia, unspecified severity, with psychotic disturbance: Secondary | ICD-10-CM | POA: Diagnosis present

## 2021-12-26 DIAGNOSIS — E870 Hyperosmolality and hypernatremia: Secondary | ICD-10-CM | POA: Diagnosis not present

## 2021-12-26 DIAGNOSIS — R45851 Suicidal ideations: Secondary | ICD-10-CM | POA: Diagnosis not present

## 2021-12-26 DIAGNOSIS — Z7189 Other specified counseling: Secondary | ICD-10-CM | POA: Diagnosis not present

## 2021-12-26 DIAGNOSIS — W1830XA Fall on same level, unspecified, initial encounter: Secondary | ICD-10-CM | POA: Diagnosis not present

## 2021-12-26 DIAGNOSIS — Z5900 Homelessness unspecified: Secondary | ICD-10-CM | POA: Diagnosis not present

## 2021-12-26 DIAGNOSIS — Z66 Do not resuscitate: Secondary | ICD-10-CM | POA: Diagnosis not present

## 2021-12-26 DIAGNOSIS — N179 Acute kidney failure, unspecified: Secondary | ICD-10-CM | POA: Diagnosis not present

## 2021-12-26 DIAGNOSIS — F0394 Unspecified dementia, unspecified severity, with anxiety: Secondary | ICD-10-CM | POA: Diagnosis present

## 2021-12-26 DIAGNOSIS — R410 Disorientation, unspecified: Secondary | ICD-10-CM | POA: Diagnosis not present

## 2021-12-26 DIAGNOSIS — Z720 Tobacco use: Secondary | ICD-10-CM | POA: Diagnosis not present

## 2021-12-26 DIAGNOSIS — R000 Tachycardia, unspecified: Secondary | ICD-10-CM | POA: Diagnosis not present

## 2021-12-26 DIAGNOSIS — Y92232 Corridor of hospital as the place of occurrence of the external cause: Secondary | ICD-10-CM | POA: Diagnosis not present

## 2021-12-26 DIAGNOSIS — N1832 Chronic kidney disease, stage 3b: Secondary | ICD-10-CM | POA: Diagnosis present

## 2021-12-26 DIAGNOSIS — Z23 Encounter for immunization: Secondary | ICD-10-CM | POA: Diagnosis not present

## 2021-12-26 DIAGNOSIS — R509 Fever, unspecified: Secondary | ICD-10-CM | POA: Diagnosis not present

## 2021-12-26 DIAGNOSIS — G9340 Encephalopathy, unspecified: Secondary | ICD-10-CM | POA: Diagnosis present

## 2021-12-26 DIAGNOSIS — Z681 Body mass index (BMI) 19 or less, adult: Secondary | ICD-10-CM | POA: Diagnosis not present

## 2021-12-26 DIAGNOSIS — F312 Bipolar disorder, current episode manic severe with psychotic features: Secondary | ICD-10-CM | POA: Diagnosis present

## 2021-12-26 DIAGNOSIS — E43 Unspecified severe protein-calorie malnutrition: Secondary | ICD-10-CM | POA: Diagnosis not present

## 2021-12-26 DIAGNOSIS — Z515 Encounter for palliative care: Secondary | ICD-10-CM | POA: Diagnosis not present

## 2021-12-26 DIAGNOSIS — R4182 Altered mental status, unspecified: Secondary | ICD-10-CM | POA: Diagnosis not present

## 2021-12-26 DIAGNOSIS — F319 Bipolar disorder, unspecified: Secondary | ICD-10-CM | POA: Diagnosis not present

## 2021-12-26 DIAGNOSIS — I509 Heart failure, unspecified: Secondary | ICD-10-CM | POA: Diagnosis not present

## 2021-12-26 DIAGNOSIS — I10 Essential (primary) hypertension: Secondary | ICD-10-CM | POA: Diagnosis not present

## 2021-12-26 DIAGNOSIS — F0393 Unspecified dementia, unspecified severity, with mood disturbance: Secondary | ICD-10-CM | POA: Diagnosis present

## 2021-12-26 DIAGNOSIS — F03918 Unspecified dementia, unspecified severity, with other behavioral disturbance: Secondary | ICD-10-CM | POA: Diagnosis present

## 2021-12-26 DIAGNOSIS — U071 COVID-19: Secondary | ICD-10-CM | POA: Diagnosis not present

## 2021-12-26 DIAGNOSIS — R7989 Other specified abnormal findings of blood chemistry: Secondary | ICD-10-CM | POA: Diagnosis not present

## 2021-12-26 DIAGNOSIS — B974 Respiratory syncytial virus as the cause of diseases classified elsewhere: Secondary | ICD-10-CM | POA: Diagnosis not present

## 2021-12-26 DIAGNOSIS — F03911 Unspecified dementia, unspecified severity, with agitation: Secondary | ICD-10-CM | POA: Diagnosis present

## 2021-12-26 DIAGNOSIS — E8809 Other disorders of plasma-protein metabolism, not elsewhere classified: Secondary | ICD-10-CM | POA: Diagnosis not present

## 2021-12-26 DIAGNOSIS — R627 Adult failure to thrive: Secondary | ICD-10-CM | POA: Diagnosis not present

## 2021-12-26 DIAGNOSIS — R64 Cachexia: Secondary | ICD-10-CM | POA: Diagnosis not present

## 2021-12-26 DIAGNOSIS — N1831 Chronic kidney disease, stage 3a: Secondary | ICD-10-CM | POA: Diagnosis not present

## 2021-12-26 DIAGNOSIS — E875 Hyperkalemia: Secondary | ICD-10-CM | POA: Diagnosis not present

## 2021-12-26 DIAGNOSIS — G9341 Metabolic encephalopathy: Secondary | ICD-10-CM | POA: Diagnosis present

## 2021-12-26 DIAGNOSIS — E872 Acidosis, unspecified: Secondary | ICD-10-CM | POA: Diagnosis not present

## 2021-12-26 DIAGNOSIS — F05 Delirium due to known physiological condition: Secondary | ICD-10-CM | POA: Diagnosis present

## 2021-12-26 DIAGNOSIS — F3110 Bipolar disorder, current episode manic without psychotic features, unspecified: Secondary | ICD-10-CM | POA: Diagnosis not present

## 2021-12-26 DIAGNOSIS — E860 Dehydration: Secondary | ICD-10-CM | POA: Diagnosis present

## 2021-12-26 LAB — TSH: TSH: 1.093 u[IU]/mL (ref 0.350–4.500)

## 2021-12-26 MED ORDER — CYANOCOBALAMIN 1000 MCG/ML IJ SOLN
1000.0000 ug | Freq: Every day | INTRAMUSCULAR | Status: AC
  Administered 2021-12-26 – 2022-01-01 (×7): 1000 ug via INTRAMUSCULAR
  Filled 2021-12-26 (×7): qty 1

## 2021-12-26 MED ORDER — HALOPERIDOL 1 MG PO TABS
1.0000 mg | ORAL_TABLET | Freq: Every day | ORAL | Status: DC
  Administered 2021-12-26 – 2022-01-28 (×32): 1 mg via ORAL
  Filled 2021-12-26 (×37): qty 1

## 2021-12-26 MED ORDER — ENOXAPARIN SODIUM 40 MG/0.4ML IJ SOSY
40.0000 mg | PREFILLED_SYRINGE | INTRAMUSCULAR | Status: DC
  Administered 2021-12-26 – 2022-11-04 (×275): 40 mg via SUBCUTANEOUS
  Filled 2021-12-26 (×285): qty 0.4

## 2021-12-26 MED ORDER — IOHEXOL 350 MG/ML SOLN
80.0000 mL | Freq: Once | INTRAVENOUS | Status: AC | PRN
  Administered 2021-12-26: 80 mL via INTRAVENOUS

## 2021-12-26 MED ORDER — HALOPERIDOL 1 MG PO TABS
2.0000 mg | ORAL_TABLET | Freq: Every day | ORAL | Status: DC
Start: 2021-12-26 — End: 2022-01-29
  Administered 2021-12-26 – 2022-01-28 (×33): 2 mg via ORAL
  Filled 2021-12-26 (×35): qty 2

## 2021-12-26 NOTE — Plan of Care (Signed)
Given negative (although technically limited) EEG, I feel that an underlying seizure is less likely an explanation of this patient's symptoms and the presentation is more consistent with dementia as supported by the significant atrophy on her imaging, which is a chronic finding  MMA, thiamine still pending, and can be followed up by primary team -If MMA results normal B12 supplementation can be discontinued -If thiamine results low, she should be supplemented  TSH within normal limits  If she develops new spell types in the future, repeat EEG for spell capture would be reasonable given that she will be at increased risk of seizures due to her atrophy.  However at this time I do not see any further inpatient neurological work-up that would be required. Neurology will be available on an as-needed basis going forward  Brooke Dare MD-PhD [facility] [phone]  Available 7 AM to 7 PM, outside these hours please contact Neurologist on call listed on AMION   No charge plan of care note

## 2021-12-26 NOTE — Progress Notes (Signed)
  Transition of Care [facility]) Screening Note   Patient Details  Name: Natalie Brown Date of Birth: 14-Apr-1966   [facility] CM/SW Contact:    Harriet Masson, RN Phone Number: 12/26/2021, 8:49 AM    Transition of Care Department [facility]) has reviewed patient and no TOC needs have been identified at this time. We will continue to monitor patient advancement through interdisciplinary progression rounds. If new patient transition needs arise, please place a TOC consult.

## 2021-12-26 NOTE — ED Notes (Deleted)
  The note originally documented on this encounter has been moved the the encounter in which it belongs.  

## 2021-12-26 NOTE — Consult Note (Signed)
[facility] Psychiatry New Face-to-Face Psychiatric Evaluation   Name: Natalie Brown DOB: January 08, 1966 MRN: [id number] Service Date: December 26, 2021 LOS:  LOS: 0 days  Reason for Consult: " Med management" Referring Provider: Bing Neighbors, MD   Assessment  Natalie Brown is a 56 y.o. female admitted medically for 12/26/2021  1:58 AM for possible catatonia/altered mental status. She carries the psychiatric diagnoses of bipolar affective disorder with psychotic symptoms and has a past medical history of hypertension.   Her current presentation of altered mental status is most consistent with catatonia versus progressive dementia versus psychogenic. She meets criteria for inpatient psychiatry based on admission prior to hospitalization.  Current inpatient BH psychotropic medications include Haldol 1 mg every morning and 2 mg nightly, propranolol 10 mg twice daily and historically she has had a good response to these medications. She was compliant with medications prior to admission as evidenced by [facility]. On initial examination, patient is cooperative with a bright affect; she repeats concerns and asks the same questions over and over.   Please see plan below for detailed recommendations.   Diagnoses:  Active Hospital problems: Principal Problem:   Acute encephalopathy Active Problems:   Bipolar affective disorder, current episode manic with psychotic symptoms (HCC)   Tobacco abuse   Altered mental state     Plan  ## Safety and Observation Level:  - Based on my clinical evaluation, I estimate the patient to be at moderate risk of self harm in the current setting of safety unawareness - At this time, we recommend a 1:1 level of observation. This decision is based on my review of the chart including patient's history and current presentation, interview of the patient, mental status examination, and consideration of suicide risk including evaluating suicidal ideation, plan, intent,  suicidal or self-harm behaviors, risk factors, and protective factors. This judgment is based on our ability to directly address suicide risk, implement suicide prevention strategies and develop a safety plan while the patient is in the clinical setting. Please contact our team if there is a concern that risk level has changed.   ## Medications:  --CONTINUE Haldol 1 mg every morning and 2 mg nightly; per [facility], plan to taper as tolerated -CONTINUE propranolol 10 mg twice daily for anxiety and tachycardia - CONTINUE atropine sublingual drops every 8 hours as needed drooling  ## Medical Decision Making Capacity:  Patient has interim legal guardian, mother Natalie Brown [phone])  ## Further Work-up:   -- most recent EKG on 9/5 had QtC of 426 -- Pertinent labwork reviewed earlier this admission includes:  D-dimer 1.55 TSH 1.371 CK 263-> 256 CBC WNL BMP largely WNL, only mildly elevated creatinine Ammonia 39 Lactic acid 1.3  ## Disposition:  --Likely return to [facility] once medically cleared  ## Behavioral / Environmental:  -- 1:1, delirium precautions DELIRIUM RECS 1: Avoid benzodiazepines, antihistamines, anticholinergics, and minimize opiate use as these may worsen delirium. 2:Assess, prevent and manage pain as lack of treatment can result in delirium.  3: Recommend consult to PT/OT if not already done. Early mobility and exercise has been shown to decrease duration of delirium.  4:Provide appropriate lighting and clear signage; a clock and calendar should be easily visible to the patient. 5:Monitor environmental factors. Reduce light and noise at night (close shades, turn off lights, turn off TV, ect). Correct any alterations in sleep cycle. 6: Reorient the patient to person, place, time and situation on each encounter.  7: Correct sensory deficits if possible (replace eye  glasses, hearing aids, ect). 8: Avoid restraints. Severely delirious patients benefit from constant observation by a  sitter. 9: Do not leave patient unattended.     ##Legal Status Patient with legal guardian  Thank you for this consult request. Recommendations have been communicated to the primary team.  We will continue to follow at this time.   [name], MD   NEW history  Relevant Aspects of Hospital Course:  Admitted on 12/26/2021 for acute encephalopathy.  Patient Report:  Patient is seen at bedside and is cooperative with assessment. [name] alert and oriented to person, place, and month. Patient asks the same questions repeatedly. An extensive amount is spent talking with patient's mom and legal guardian who believes that the medications and being at [facility] has worsened her daughter's condition despite explaining that her brain imaging has showed significant atrophy that is marked for patient's age and how that is a gradual process and not medication-induced. She informs that a nurse told her family that patient "absolutely does not have dementia and shouldn't be on her current medication regimen," and I gave her the evidence showing how that is not true including imaging and MOCA score obtained at [facility]. Her questions are answered, and mom said that she will look into other options other than returning to behavioral health tonight.    Psychiatric History:  Information collected from chart. Bipolar affective disorder with psychotic features, MNCD Admitted to [facility] since Sep 06, 2021  Family psych history: Patient reports mental illness runs in her father's side of the family.   Social History:   Tobacco use: Half PPD; no use since admission Alcohol use: No use since admission Drug use: Denies; UDS negative on admission in May  Family History:  The patient's family history is not on file.  Medical History: Past Medical History:  Diagnosis Date   Bipolar 1 disorder with moderate mania (HCC) 12/30/2013   Hypertension     Surgical History: No past surgical history on file.  Medications:    Current Facility-Administered Medications:    acetaminophen (TYLENOL) tablet 650 mg, 650 mg, Oral, Q6H PRN, Orland Mustard, MD   alum & mag hydroxide-simeth (MAALOX/MYLANTA) 200-200-20 MG/5ML suspension 30 mL, 30 mL, Oral, Q4H PRN, Orland Mustard, MD   atropine 1 % ophthalmic solution 1 drop, 1 drop, Sublingual, Q8H PRN, Orland Mustard, MD   cyanocobalamin (VITAMIN B12) injection 1,000 mcg, 1,000 mcg, Intramuscular, Daily, Ghimire, Werner Lean, MD   docusate sodium (COLACE) capsule 100 mg, 100 mg, Oral, Daily PRN, Orland Mustard, MD   enoxaparin (LOVENOX) injection 40 mg, 40 mg, Subcutaneous, Q24H, Ghimire, Werner Lean, MD   feeding supplement (KATE FARMS STANDARD 1.4) liquid 325 mL, 325 mL, Oral, BID BM, Orland Mustard, MD, 325 mL at 12/26/21 0841   fluticasone (FLONASE) 50 MCG/ACT nasal spray 1 spray, 1 spray, Each Nare, Daily PRN, Orland Mustard, MD   haloperidol (HALDOL) tablet 1 mg, 1 mg, Oral, Daily **AND** haloperidol (HALDOL) tablet 2 mg, 2 mg, Oral, QHS, Brylyn Novakovich, Toni Amend, MD   hydrocerin (EUCERIN) cream, , Topical, BID, Orland Mustard, MD   hydrOXYzine (ATARAX) tablet 25 mg, 25 mg, Oral, TID PRN, Orland Mustard, MD   LORazepam (ATIVAN) tablet 0.5 mg, 0.5 mg, Oral, Q6H PRN, Orland Mustard, MD   magnesium hydroxide (MILK OF MAGNESIA) suspension 30 mL, 30 mL, Oral, Daily PRN, Orland Mustard, MD   nicotine (NICODERM CQ - dosed in mg/24 hr) patch 14 mg, 14 mg, Transdermal, Daily, Orland Mustard, MD, 14 mg at 12/26/21 [phone]  OLANZapine zydis (ZYPREXA) disintegrating tablet 5 mg, 5 mg, Oral, Q8H PRN **AND** ziprasidone (GEODON) injection 20 mg, 20 mg, Intramuscular, PRN, Orland Mustard, MD   ondansetron [facility]) tablet 4 mg, 4 mg, Oral, Q8H PRN, Orland Mustard, MD   polyethylene glycol (MIRALAX / GLYCOLAX) packet 17 g, 17 g, Oral, Daily PRN, Orland Mustard, MD   propranolol (INDERAL) tablet 10 mg, 10 mg, Oral, BID, Orland Mustard, MD, 10 mg at 12/26/21 0840  Allergies: Allergies  Allergen  Reactions   Penicillins Itching       Objective  Vital signs:  Temp:  [97.9 F (36.6 C)-98.4 F (36.9 C)] 97.9 F (36.6 C) (09/06 0202) Pulse Rate:  [67-123] 86 (09/06 1200) Resp:  [13-21] 13 (09/06 1200) BP: (114-149)/(74-98) 120/85 (09/06 1200) SpO2:  [95 %-100 %] 95 % (09/06 0202)  Psychiatric Specialty Exam:  Presentation  General Appearance: Appropriate for Environment; Fairly Groomed  Eye Contact:Good  Speech:Clear and Coherent  Speech Volume:Normal  Handedness:Right   Mood and Affect  Mood:Depressed  Affect:Congruent   Thought Process  Thought Processes:Disorganized  Descriptions of Associations:Intact  Orientation:Partial  Thought Content:Logical  History of Schizophrenia/Schizoaffective disorder:No  Duration of Psychotic Symptoms:N/A  Hallucinations:No data recorded Ideas of Reference:None  Suicidal Thoughts:No data recorded Homicidal Thoughts:No data recorded  Sensorium  Memory:Remote Poor; Recent Poor; Immediate Fair  Judgment:Poor  Insight:Poor   Executive Functions  Concentration:Fair  Attention Span:Fair  Recall:Poor  Fund of Knowledge:Poor  Language:Fair   Psychomotor Activity  Psychomotor Activity:No data recorded  Assets  Assets:Resilience   Sleep  Sleep:No data recorded   Physical Exam: Physical Exam Vitals reviewed.  Constitutional:      General: [name] not in acute distress.    Appearance: [name] not toxic-appearing.  HENT:     Head: Normocephalic and atraumatic.     Mouth/Throat:     Mouth: Mucous membranes are moist.     Pharynx: Oropharynx is clear.  Pulmonary:     Effort: Pulmonary effort is normal.  Skin:    General: Skin is warm and dry.  Neurological:     General: No focal deficit present.     Mental Status: [name] alert and oriented to person, place, and time.     Motor: No weakness.     Gait: Gait normal.     Blood pressure 120/85, pulse 86, temperature 97.9 F (36.6 C),  temperature source Oral, resp. rate 13, SpO2 95 %. There is no height or weight on file to calculate BMI.

## 2021-12-26 NOTE — ED Notes (Signed)
Restraints removed for CT scan. Remain off. Sitter at bedside.

## 2021-12-26 NOTE — Plan of Care (Signed)
  Problem: Safety: Goal: Non-violent Restraint(s) Outcome: Progressing   Problem: Education: Goal: Knowledge of General Education information will improve Description: Including pain rating scale, medication(s)/side effects and non-pharmacologic comfort measures Outcome: Progressing   Problem: Health Behavior/Discharge Planning: Goal: Ability to manage health-related needs will improve Outcome: Progressing   

## 2021-12-26 NOTE — Progress Notes (Signed)
EEG D/C'd. No skin breakdown. [facility] notified.  

## 2021-12-26 NOTE — Progress Notes (Signed)
PROGRESS NOTE        PATIENT DETAILS Name: Natalie Brown Age: 56 y.o. Sex: female Date of Birth: July 06, 1965 Admit Date: 12/26/2021 Admitting Physician Provider Default, MD [id number], [name], NP  Brief Summary: Patient is a 56 y.o.  female with history of bipolar disorder-who was hospitalized at [facility] received a Ativan challenge for possible catatonia-with minimal improvement-due to persistent altered mental status/tachycardia-patient was admitted to [facility] service for further evaluation and treatment.  Significant events: 9/4>> transferred to [facility].  Altered-no improvement after Ativan challenge.  Significant studies: 9/4>> vitamin B12: 290 9/4>> CXR: No obvious pneumonia 9/4>> MRI brain: No acute intracranial abnormality. 9/4-9/5>> LTM EEG: No seizures. 9/5>> TSH: 1.3 9/6>> CTA chest: No PE-no PNA.  Significant microbiology data: None  Procedures: None   Consults: Neurology  Subjective: Lying comfortably in bed-denies any chest pain or shortness of breath.  Slow to respond but able to tell me her name-knows Barbette Or is the president-knows that this is September and able to tell me that she is in [facility].  At times she requires minimal redirection.  Objective: Vitals: Blood pressure 114/77, pulse 67, temperature 97.9 F (36.6 C), temperature source Oral, resp. rate 14, SpO2 95 %.   Exam: Gen Exam: HEENT:atraumatic, normocephalic Chest: B/L clear to auscultation anteriorly CVS:S1S2 regular Abdomen:soft non tender, non distended Extremities:no edema Neurology: Non focal Skin: no rash  Pertinent Labs/Radiology:    Latest Ref Rng & Units 12/25/2021    5:16 AM 12/24/2021    1:14 PM 12/20/2021    6:25 PM  CBC  WBC 4.0 - 10.5 K/uL 5.3  5.5  6.2   Hemoglobin 12.0 - 15.0 g/dL 14.1  14.9  13.9   Hematocrit 36.0 - 46.0 % 41.6  45.5  42.1   Platelets 150 - 400 K/uL 266  279  302     Lab Results  Component Value Date   NA  142 12/25/2021   K 3.9 12/25/2021   CL 106 12/25/2021   CO2 25 12/25/2021      Assessment/Plan: Acute encephalopathy: Unclear etiology-neuroimaging-EEG negative-suspect this is likely psychogenic.  Reconsulted psychiatry today-she likely needs to be restarted on her antipsychotics.  We will need to discuss with neurology if there is any utility in continuing LTM EEG.  Vitamin B12 deficiency: Continue supplementation.  Bipolar disorder: Psych reconsulted-we will defer further to psychiatry service.  Tobacco abuse: Continue transdermal nicotine  BMI: Estimated body mass index is 25.62 kg/m as calculated from the following:   Height as of 12/24/21: 5\' 6"  (1.676 m).   Weight as of 12/24/21: 72 kg.   Code status:   Code Status: Prior   DVT Prophylaxis: enoxaparin (LOVENOX) injection 40 mg Start: 12/26/21 1115    Family Communication: None at bedside   Disposition Plan: Status is: Observation The patient will require care spanning > 2 midnights and should be moved to inpatient because: LTM EEG in progress-needs neurology clearance before being discharged back to [facility].   Planned Discharge Destination: [facility].   Diet: Diet Order             Diet regular Room service appropriate? Yes; Fluid consistency: Thin  Diet effective now                     Antimicrobial agents: Anti-infectives (From admission, onward)    None  MEDICATIONS: Scheduled Meds:  enoxaparin (LOVENOX) injection  40 mg Subcutaneous Q24H   feeding supplement (KATE FARMS STANDARD 1.4)  325 mL Oral BID BM   hydrocerin   Topical BID   nicotine  14 mg Transdermal Daily   propranolol  10 mg Oral BID   Continuous Infusions: PRN Meds:.acetaminophen, alum & mag hydroxide-simeth, atropine, docusate sodium, fluticasone, hydrOXYzine, LORazepam, magnesium hydroxide, OLANZapine zydis **AND** ziprasidone, ondansetron, polyethylene glycol   I have personally reviewed following labs and imaging  studies  LABORATORY DATA: CBC: Recent Labs  Lab 12/20/21 1825 12/24/21 1314 12/25/21 0516  WBC 6.2 5.5 5.3  NEUTROABS 3.6 3.5  --   HGB 13.9 14.9 14.1  HCT 42.1 45.5 41.6  MCV 90.1 92.5 88.9  PLT 302 279 266    Basic Metabolic Panel: Recent Labs  Lab 12/20/21 1825 12/24/21 1507 12/25/21 0516  NA 141 142 142  K 4.3 4.7 3.9  CL 110 105 106  CO2 26 24 25   GLUCOSE 92 99 108*  BUN 14 12 9   CREATININE 1.19* 1.13* 1.05*  CALCIUM 9.1 9.7 9.3    GFR: Estimated Creatinine Clearance: 62.3 mL/min (A) (by C-G formula based on SCr of 1.05 mg/dL (H)).  Liver Function Tests: Recent Labs  Lab 12/20/21 1825 12/24/21 1507  AST 20 28  ALT 14 16  ALKPHOS 58 55  BILITOT 0.6 0.6  PROT 7.2 7.4  ALBUMIN 3.6 3.7   No results for input(s): "LIPASE", "AMYLASE" in the last 168 hours. Recent Labs  Lab 12/24/21 1420  AMMONIA 39*    Coagulation Profile: No results for input(s): "INR", "PROTIME" in the last 168 hours.  Cardiac Enzymes: Recent Labs  Lab 12/24/21 1420 12/25/21 0516  CKTOTAL 263* 256*    BNP (last 3 results) No results for input(s): "PROBNP" in the last 8760 hours.  Lipid Profile: No results for input(s): "CHOL", "HDL", "LDLCALC", "TRIG", "CHOLHDL", "LDLDIRECT" in the last 72 hours.  Thyroid Function Tests: Recent Labs    12/25/21 1130  TSH 1.371    Anemia Panel: Recent Labs    12/24/21 1420  VITAMINB12 290    Urine analysis:    Component Value Date/Time   COLORURINE YELLOW 12/21/2021 1102   APPEARANCEUR CLEAR 12/21/2021 1102   LABSPEC 1.017 12/21/2021 1102   PHURINE 6.0 12/21/2021 1102   GLUCOSEU NEGATIVE 12/21/2021 1102   HGBUR NEGATIVE 12/21/2021 1102   BILIRUBINUR NEGATIVE 12/21/2021 1102   BILIRUBINUR neg 08/07/2016 1526   KETONESUR NEGATIVE 12/21/2021 1102   PROTEINUR NEGATIVE 12/21/2021 1102   UROBILINOGEN 2.0 (A) 08/07/2016 1526   UROBILINOGEN 1 12/30/2013 1216   NITRITE NEGATIVE 12/21/2021 1102   LEUKOCYTESUR NEGATIVE  12/21/2021 1102    Sepsis Labs: Lactic Acid, Venous    Component Value Date/Time   LATICACIDVEN 1.3 12/24/2021 1314    MICROBIOLOGY: Recent Results (from the past 240 hour(s))  Resp Panel by RT-PCR (Flu A&B, Covid) Anterior Nasal Swab     Status: None   Collection Time: 12/24/21  1:42 PM   Specimen: Anterior Nasal Swab  Result Value Ref Range Status   SARS Coronavirus 2 by RT PCR NEGATIVE NEGATIVE Final    Comment: (NOTE) SARS-CoV-2 target nucleic acids are NOT DETECTED.  The SARS-CoV-2 RNA is generally detectable in upper respiratory specimens during the acute phase of infection. The lowest concentration of SARS-CoV-2 viral copies this assay can detect is 138 copies/mL. A negative result does not preclude SARS-Cov-2 infection and should not be used as the sole basis for treatment or  other patient management decisions. A negative result may occur with  improper specimen collection/handling, submission of specimen other than nasopharyngeal swab, presence of viral mutation(s) within the areas targeted by this assay, and inadequate number of viral copies(<138 copies/mL). A negative result must be combined with clinical observations, patient history, and epidemiological information. The expected result is Negative.  Fact Sheet for Patients:  EntrepreneurPulse.com.au  Fact Sheet for Healthcare Providers:  IncredibleEmployment.be  This test is no t yet approved or cleared by the [place] FDA and  has been authorized for detection and/or diagnosis of SARS-CoV-2 by FDA under an Emergency Use Authorization (EUA). This EUA will remain  in effect (meaning this test can be used) for the duration of the COVID-19 declaration under Section 564(b)(1) of the Act, 21 U.S.C.section 360bbb-3(b)(1), unless the authorization is terminated  or revoked sooner.       Influenza A by PCR NEGATIVE NEGATIVE Final   Influenza B by PCR NEGATIVE NEGATIVE  Final    Comment: (NOTE) The Xpert Xpress SARS-CoV-2/FLU/RSV plus assay is intended as an aid in the diagnosis of influenza from Nasopharyngeal swab specimens and should not be used as a sole basis for treatment. Nasal washings and aspirates are unacceptable for Xpert Xpress SARS-CoV-2/FLU/RSV testing.  Fact Sheet for Patients: EntrepreneurPulse.com.au  Fact Sheet for Healthcare Providers: IncredibleEmployment.be  This test is not yet approved or cleared by the [place] FDA and has been authorized for detection and/or diagnosis of SARS-CoV-2 by FDA under an Emergency Use Authorization (EUA). This EUA will remain in effect (meaning this test can be used) for the duration of the COVID-19 declaration under Section 564(b)(1) of the Act, 21 U.S.C. section 360bbb-3(b)(1), unless the authorization is terminated or revoked.  Performed at [facility], [place] [address], [place], [place] [address]     RADIOLOGY STUDIES/RESULTS: CT Angio Chest Pulmonary Embolism (PE) W or WO Contrast  Result Date: 12/26/2021 CLINICAL DATA:  Concern for pulmonary embolism. EXAM: CT ANGIOGRAPHY CHEST WITH CONTRAST TECHNIQUE: Multidetector CT imaging of the chest was performed using the standard protocol during bolus administration of intravenous contrast. Multiplanar CT image reconstructions and MIPs were obtained to evaluate the vascular anatomy. RADIATION DOSE REDUCTION: This exam was performed according to the departmental dose-optimization program which includes automated exposure control, adjustment of the mA and/or kV according to patient size and/or use of iterative reconstruction technique. CONTRAST:  30mL OMNIPAQUE IOHEXOL 350 MG/ML SOLN COMPARISON:  Chest radiograph dated 12/24/2021. FINDINGS: Cardiovascular: There is no cardiomegaly or pericardial effusion. The thoracic aorta is unremarkable. No pulmonary artery embolus identified. Mediastinum/Nodes: No hilar or  mediastinal adenopathy. The esophagus is grossly unremarkable no mediastinal fluid collection. Lungs/Pleura: Background of mild centrilobular emphysema. There are bibasilar subpleural dependent atelectasis. No consolidative changes. There is no pleural effusion or pneumothorax. The central airways are patent. Upper Abdomen: No acute abnormality. Musculoskeletal: No chest wall abnormality. No acute or significant osseous findings. Review of the MIP images confirms the above findings. IMPRESSION: 1. No acute intrathoracic pathology. No CT evidence of pulmonary artery embolus. 2. Emphysema (ICD10-J43.9). Electronically Signed   By: Anner Crete M.D.   On: 12/26/2021 01:12   Overnight EEG with video  Result Date: 12/25/2021 Lora Havens, MD     12/25/2021  9:11 AM Patient Name: Natalie Brown MRN: [id number] Epilepsy Attending: Lora Havens Referring Physician/Provider: Orma Flaming, MD Duration: 12/24/2021 1831 to 12/25/2021 0844 Patient history: 56 year old female with altered mental status.  EEG to evaluate for seizure. Level  of alertness: Awake AEDs during EEG study: None Technical aspects: This EEG study was done with scalp electrodes positioned according to the 10-20 International system of electrode placement. Electrical activity was reviewed with band pass filter of 1-70Hz , sensitivity of 7 uV/mm, display speed of 11mm/sec with a 60Hz  notched filter applied as appropriate. EEG data were recorded continuously and digitally stored.  Video monitoring was available and reviewed as appropriate. Description: The posterior dominant rhythm consists of 9-10 Hz activity of moderate voltage (25-35 uV) seen predominantly in posterior head regions, symmetric and reactive to eye opening and eye closing. Hyperventilation and photic stimulation were not performed.   IMPRESSION: This study is within normal limits. No seizures or epileptiform discharges were seen throughout the recording. A normal interictal EEG does  not exclude nor support the diagnosis of epilepsy.   MR BRAIN WO CONTRAST  Result Date: 12/24/2021 CLINICAL DATA:  Mental status change, unknown cause. Episode of unresponsiveness today. EXAM: MRI HEAD WITHOUT CONTRAST TECHNIQUE: Multiplanar, multiecho pulse sequences of the brain and surrounding structures were obtained without intravenous contrast. COMPARISON:  Head CT 10/09/2021 FINDINGS: Brain: There is no evidence of an acute infarct, intracranial hemorrhage, mass, midline shift, or extra-axial fluid collection. Moderate cerebral and cerebellar atrophy is greatly advanced for age and unchanged. No significant white matter disease is evident. Vascular: Major intracranial vascular flow voids are preserved. Skull and upper cervical spine: Unremarkable bone marrow signal. Sinuses/Orbits: Unremarkable orbits. Paranasal sinuses and mastoid air cells are clear. Other: None. IMPRESSION: 1. No acute intracranial abnormality. 2. Advanced cerebral and cerebellar atrophy. Electronically Signed   By: 10/11/2021 M.D.   On: 12/24/2021 16:08   DG Chest 2 View  Result Date: 12/24/2021 CLINICAL DATA:  Altered mental status EXAM: CHEST - 2 VIEW COMPARISON:  11/25/2021 FINDINGS: Cardiac size is within normal limits. There is poor inspiration. Crowding of markings in the lower lung fields may be due to poor inspiration. There are no signs of alveolar pulmonary edema. There is increased density in the lateral aspect of left lower lung field. Left lateral CP angle is indistinct. There is no pneumothorax. IMPRESSION: Crowding of markings in the lower lung fields may be due to poor inspiration. Left lateral CP angle is indistinct suggesting possible small area of atelectasis/pneumonia and small left pleural effusion. Electronically Signed   By: 01/25/2022 M.D.   On: 12/24/2021 12:53     LOS: 0 days   02/23/2022, MD  [facility]    To contact the attending provider between 7A-7P or  the covering provider during after hours 7P-7A, please log into the web site [email or web address] and access using universal [place] password for that web site. If you do not have the password, please call the hospital operator.  12/26/2021, 10:21 AM

## 2021-12-26 NOTE — Procedures (Addendum)
Patient Name: Natalie Brown  MRN: [id number]  Epilepsy Attending: Charlsie Quest  Referring Physician/Provider: Orland Mustard, MD Duration: 12/25/2021 1831 to 12/26/2021 1418   Patient history: 56 year old female with altered mental status.  EEG to evaluate for seizure.   Level of alertness: Awake   AEDs during EEG study: None   Technical aspects: This EEG study was done with scalp electrodes positioned according to the 10-20 International system of electrode placement. Electrical activity was reviewed with band pass filter of 1-70Hz , sensitivity of 7 uV/mm, display speed of 91mm/sec with a 60Hz  notched filter applied as appropriate. EEG data were recorded continuously and digitally stored.  Video monitoring was available and reviewed as appropriate.   Description: The posterior dominant rhythm consists of 9-10 Hz activity of moderate voltage (25-35 uV) seen predominantly in posterior head regions, symmetric and reactive to eye opening and eye closing. Hyperventilation and photic stimulation were not performed.      Of note, study was not interpretable between 12/25/2021 at 2300 to 12/26/2021 at 1220   IMPRESSION: This study is within normal limits. No seizures or epileptiform discharges were seen throughout the recording.   A normal interictal EEG does not exclude nor support the diagnosis of epilepsy.     Nakhi Choi 02/25/2022

## 2021-12-26 NOTE — Evaluation (Signed)
Occupational Therapy Evaluation Patient Details Name: Natalie Brown MRN: [id number] DOB: 05-10-1965 Today's Date: 12/26/2021   History of Present Illness Patient is a 56 y.o.  female with history of bipolar disorder-who was hospitalized at [facility] received a Ativan challenge for possible catatonia-with minimal improvement-due to persistent altered mental status/tachycardia-patient was admitted to [facility] service for further evaluation and treatment.   Clinical Impression   Natalie Brown was evaluated s/p the above admission list, pt unable to detail PLOF and not family present at the time of evaluation. Overall she had functional limitations due to impaired cognition and required supervision A for mobility and up to min A for ADLs. Assist needed for verbal cues to imitate, sequence and terminate tasks, and fro safety. OT to continue to follow acutely. D/c recommendations to be determined based on pt's progress and PLOF/home set up PTA.      Recommendations for follow up therapy are one component of a multi-disciplinary discharge planning process, led by the attending physician.  Recommendations may be updated based on patient status, additional functional criteria and insurance authorization.   Follow Up Recommendations  Outpatient OT (pt active with OT PTA)    Assistance Recommended at Discharge Frequent or constant Supervision/Assistance  Patient can return home with the following A little help with bathing/dressing/bathroom;Assistance with cooking/housework;Direct supervision/assist for medications management;Direct supervision/assist for financial management;Assist for transportation    Functional Status Assessment  Patient has had a recent decline in their functional status and demonstrates the ability to make significant improvements in function in a reasonable and predictable amount of time.  Equipment Recommendations  None recommended by OT    Recommendations for Other Services        Precautions / Restrictions Precautions Precautions: Fall Precaution Comments: EEG monitoring Restrictions Weight Bearing Restrictions: No      Mobility Bed Mobility Overal bed mobility: Needs Assistance             General bed mobility comments: sitting EOB upon arrival    Transfers Overall transfer level: Needs assistance Equipment used: None Transfers: Sit to/from Stand Sit to Stand: Min guard                  Balance Overall balance assessment: Needs assistance Sitting-balance support: Feet supported Sitting balance-Leahy Scale: Good     Standing balance support: No upper extremity supported, During functional activity Standing balance-Leahy Scale: Fair                             ADL either performed or assessed with clinical judgement   ADL Overall ADL's : Needs assistance/impaired Eating/Feeding: Supervision/ safety;Sitting Eating/Feeding Details (indicate cue type and reason): needed cues to chew and swallow, otherwise pt observed sitting with food in mouth Grooming: Minimal assistance;Standing Grooming Details (indicate cue type and reason): at the sink, min A for verbal cues Upper Body Bathing: Minimal assistance;Sitting   Lower Body Bathing: Minimal assistance;Sit to/from stand   Upper Body Dressing : Minimal assistance;Sitting   Lower Body Dressing: Minimal assistance;Sit to/from stand   Toilet Transfer: Min guard;Ambulation   Toileting- Clothing Manipulation and Hygiene: Supervision/safety;Sitting/lateral lean       Functional mobility during ADLs: Min guard General ADL Comments: min A for verbal cues for attention, initiation, sequencing and termination of task     Vision Baseline Vision/History: 0 No visual deficits Vision Assessment?: No apparent visual deficits Additional Comments: seemingly WFL, unable to fully assess  Pertinent Vitals/Pain Pain Assessment Pain Assessment: Faces Faces Pain Scale: No  hurt Pain Intervention(s): Monitored during session, Limited activity within patient's tolerance     Hand Dominance     Extremity/Trunk Assessment Upper Extremity Assessment Upper Extremity Assessment: Overall WFL for tasks assessed;Difficult to assess due to impaired cognition (Pt with very poor command following to fully assess. Able to use BUE appropriately for functional tasks)   Lower Extremity Assessment Lower Extremity Assessment: Overall WFL for tasks assessed   Cervical / Trunk Assessment Cervical / Trunk Assessment: Normal   Communication Communication Communication: Expressive difficulties (slow/deliberate speech)   Cognition Arousal/Alertness: Awake/alert Behavior During Therapy: Impulsive Overall Cognitive Status: No family/caregiver present to determine baseline cognitive functioning                                 General Comments: no family present to determine baseline cognition. per chart pt has been working with OT in group settings. Pt with poor command following, extremely slow processing and unable to dual task     General Comments  NT/sitter in room, HR to 140s            Home Living Family/patient expects to be discharged to:: Shelter/Homeless Living Arrangements: Alone                               Additional Comments: unable to provide, per chart pt from [facility]      Prior Functioning/Environment Prior Level of Function : Patient poor historian/Family not available             Mobility Comments: seemingly indep, no AD - pt unable to provide accurate history ADLs Comments: Pt reports she did not need assist with ADLs PTA - not a reliable historian        OT Problem List: Decreased activity tolerance;Decreased cognition;Decreased safety awareness      OT Treatment/Interventions: Cognitive remediation/compensation;Therapeutic activities;Self-care/ADL training    OT Goals(Current goals can be found in the care  plan section) Acute Rehab OT Goals Patient Stated Goal: did not state OT Goal Formulation: With patient Time For Goal Achievement: 01/09/22 Potential to Achieve Goals: Good ADL Goals Pt Will Perform Grooming: Independently;standing Pt Will Perform Upper Body Dressing: Independently;sitting Pt Will Perform Lower Body Dressing: Independently;sit to/from stand Additional ADL Goal #1: Pt will indep complete IADL medication management task Additional ADL Goal #2: pt will follow 2-3 step commands 50% of the session to complete functional tasks  OT Frequency: Min 2X/week       AM-PAC OT "6 Clicks" Daily Activity     Outcome Measure Help from another person eating meals?: A Little Help from another person taking care of personal grooming?: A Little Help from another person toileting, which includes using toliet, bedpan, or urinal?: A Little Help from another person bathing (including washing, rinsing, drying)?: A Little Help from another person to put on and taking off regular upper body clothing?: A Little Help from another person to put on and taking off regular lower body clothing?: A Little 6 Click Score: 18   End of Session Nurse Communication: Mobility status  Activity Tolerance: Patient tolerated treatment well Patient left: in bed;with call bell/phone within reach;with bed alarm set;with nursing/sitter in room  OT Visit Diagnosis: Muscle weakness (generalized) (M62.81)                Time: 5361-4431  OT Time Calculation (min): 15 min Charges:  OT General Charges $OT Visit: 1 Visit OT Evaluation $OT Eval Moderate Complexity: 1 Mod   Bereket Gernert A Wynette [place] 12/26/2021, 5:34 PM

## 2021-12-26 NOTE — Evaluation (Signed)
Physical Therapy Evaluation Patient Details Name: Natalie Brown MRN: [id number] DOB: 05-May-1965 Today's Date: 12/26/2021  History of Present Illness  Patient is a 56 y.o.  female with history of bipolar disorder-who was hospitalized at [facility] received a Ativan challenge for possible catatonia-with minimal improvement-due to persistent altered mental status/tachycardia-patient was admitted to [facility] service for further evaluation and treatment.  Clinical Impression  Patient presents with decreased mobility due to limited safety awareness, decreased activity tolerance limited by EEG monitoring.  Patient min A to S for mobility in the room.  Feel she will likely progress and not need follow up PT at d/c.  Concern for placement as mother questioning if she should return to [facility].  PT will follow acutely.        Recommendations for follow up therapy are one component of a multi-disciplinary discharge planning process, led by the attending physician.  Recommendations may be updated based on patient status, additional functional criteria and insurance authorization.  Follow Up Recommendations No PT follow up      Assistance Recommended at Discharge Intermittent Supervision/Assistance  Patient can return home with the following  A little help with bathing/dressing/bathroom;Assist for transportation;Assistance with cooking/housework;Direct supervision/assist for medications management    Equipment Recommendations None recommended by PT  Recommendations for Other Services       Functional Status Assessment Patient has had a recent decline in their functional status and demonstrates the ability to make significant improvements in function in a reasonable and predictable amount of time.     Precautions / Restrictions Precautions Precaution Comments: EEG monitoring      Mobility  Bed Mobility Overal bed mobility: Needs Assistance Bed Mobility: Supine to Sit, Sit to Supine     Supine to sit:  HOB elevated, Min assist Sit to supine: Supervision   General bed mobility comments: assist for safety with lines and for initiation coming up to sit    Transfers Overall transfer level: Needs assistance Equipment used: None Transfers: Sit to/from Stand Sit to Stand: Supervision           General transfer comment: assist for safety with lines    Ambulation/Gait Ambulation/Gait assistance: Min assist Gait Distance (Feet): 6 Feet Assistive device: None Gait Pattern/deviations: Step-through pattern       General Gait Details: assist for safety with lines, pt walking to check her bag in chair near the window; limited by EEG monitoring  Stairs            Wheelchair Mobility    Modified Rankin (Stroke Patients Only)       Balance Overall balance assessment: Needs assistance   Sitting balance-Leahy Scale: Good       Standing balance-Leahy Scale: Fair Standing balance comment: assist for safety while reaching for bag and no awareness of EEG wires                             Pertinent Vitals/Pain Pain Assessment Pain Assessment: No/denies pain    Home Living Family/patient expects to be discharged to:: Shelter/Homeless Living Arrangements: Alone                      Prior Function Prior Level of Function : Independent/Modified Independent                     Hand Dominance        Extremity/Trunk Assessment   Upper Extremity Assessment Upper Extremity  Assessment: Overall WFL for tasks assessed    Lower Extremity Assessment Lower Extremity Assessment: Overall WFL for tasks assessed       Communication   Communication: No difficulties  Cognition Arousal/Alertness: Awake/alert Behavior During Therapy: Impulsive Overall Cognitive Status: Impaired/Different from baseline Area of Impairment: Attention, Following commands, Safety/judgement                   Current Attention Level: Sustained   Following  Commands: Follows one step commands with increased time, Follows one step commands inconsistently Safety/Judgement: Decreased awareness of safety     General Comments: Limited attention to task and easily distracted limiting safety; she remembered she had EEG and telemetry wires until she saw she wanted a new brief and walked to check in the bag for her package of briefs despite reminders to wait and stop since she was hooked up to wires.        General Comments General comments (skin integrity, edema, etc.): mother in the room and reports she was concerned about pt returning to [facility] due to medication issues.    Exercises     Assessment/Plan    PT Assessment Patient needs continued PT services  PT Problem List Decreased safety awareness;Decreased mobility;Decreased activity tolerance       PT Treatment Interventions Balance training;Gait training;Functional mobility training;Therapeutic activities;Patient/family education    PT Goals (Current goals can be found in the Care Plan section)  Acute Rehab PT Goals Patient Stated Goal: to return to independent PT Goal Formulation: With patient/family Time For Goal Achievement: 01/02/22 Potential to Achieve Goals: Good    Frequency Min 3X/week     Co-evaluation               AM-PAC PT "6 Clicks" Mobility  Outcome Measure Help needed turning from your back to your side while in a flat bed without using bedrails?: A Little Help needed moving from lying on your back to sitting on the side of a flat bed without using bedrails?: A Little Help needed moving to and from a bed to a chair (including a wheelchair)?: A Little Help needed standing up from a chair using your arms (e.g., wheelchair or bedside chair)?: A Little Help needed to walk in hospital room?: A Little Help needed climbing 3-5 steps with a railing? : Total 6 Click Score: 16    End of Session   Activity Tolerance: Patient tolerated treatment well Patient left: in  bed;with call bell/phone within reach;with family/visitor present;with bed alarm set   PT Visit Diagnosis: Other abnormalities of gait and mobility (R26.89)    Time: 6073-7106 PT Time Calculation (min) (ACUTE ONLY): 18 min   Charges:   PT Evaluation $PT Eval Moderate Complexity: 1 Mod PT Treatments $Gait Training: 8-22 mins        Sheran Lawless, PT Acute Rehabilitation Services [phone] 12/26/2021   Elray Mcgregor 12/26/2021, 1:50 PM

## 2021-12-26 NOTE — Procedures (Addendum)
Patient Name: DIMPLES PROBUS  MRN: [id number]  Epilepsy Attending: Charlsie Quest  Referring Physician/Provider: Orland Mustard, MD Duration: 12/24/2021 1831 to 12/25/2021 1831  Patient history: 56 year old female with altered mental status.  EEG to evaluate for seizure.  Level of alertness: Awake  AEDs during EEG study: None  Technical aspects: This EEG study was done with scalp electrodes positioned according to the 10-20 International system of electrode placement. Electrical activity was reviewed with band pass filter of 1-70Hz , sensitivity of 7 uV/mm, display speed of 61mm/sec with a 60Hz  notched filter applied as appropriate. EEG data were recorded continuously and digitally stored.  Video monitoring was available and reviewed as appropriate.  Description: The posterior dominant rhythm consists of 9-10 Hz activity of moderate voltage (25-35 uV) seen predominantly in posterior head regions, symmetric and reactive to eye opening and eye closing. Hyperventilation and photic stimulation were not performed.     Of note, study was not interpretable between 0933 to 1800  IMPRESSION: This study is within normal limits. No seizures or epileptiform discharges were seen throughout the recording.  A normal interictal EEG does not exclude nor support the diagnosis of epilepsy.   Torres Hardenbrook 

## 2021-12-27 ENCOUNTER — Other Ambulatory Visit: Payer: Self-pay

## 2021-12-27 DIAGNOSIS — F312 Bipolar disorder, current episode manic severe with psychotic features: Secondary | ICD-10-CM

## 2021-12-27 DIAGNOSIS — Z720 Tobacco use: Secondary | ICD-10-CM

## 2021-12-27 NOTE — Progress Notes (Signed)
PROGRESS NOTE        PATIENT DETAILS Name: Natalie Brown Age: 56 y.o. Sex: female Date of Birth: 02-25-1966 Admit Date: 12/24/2021 Admitting Physician Evalee Mutton Kristeen Mans, MD [id number], [name], NP  Brief Summary: Patient is a 56 y.o.  female with history of bipolar disorder-who was hospitalized at [facility] received a Ativan challenge for possible catatonia-with minimal improvement-due to persistent altered mental status/tachycardia-patient was admitted to [facility] service for further evaluation and treatment.  Significant events: 9/4>> transferred to [facility]  Altered-no improvement after Ativan challenge.  Significant studies: 9/4>> vitamin B12: 290 9/4>> CXR: No obvious pneumonia 9/4>> MRI brain: No acute intracranial abnormality.  Advanced cerebral/cerebellar atrophy 9/4-9/5>> LTM EEG: No seizures. 9/5>> TSH: 1.3 9/5-9/6>> LTM EEG: Negative. 9/6>> CTA chest: No PE-no PNA.  Significant microbiology data: None  Procedures: None   Consults: Neurology  Subjective: More awake/alert.  Confused-answers only some questions appropriately.  But seems more with it today compared to yesterday.  Objective: Vitals: Blood pressure 112/81, pulse (!) 140, temperature 98.4 F (36.9 C), temperature source Oral, resp. rate 19, SpO2 (!) 87 %.   Exam: Gen Exam: Confused not in any distress HEENT:atraumatic, normocephalic Chest: B/L clear to auscultation anteriorly CVS:S1S2 regular Abdomen:soft non tender, non distended Extremities:no edema Neurology: Non focal Skin: no rash   Pertinent Labs/Radiology:    Latest Ref Rng & Units 12/25/2021    5:16 AM 12/24/2021    1:14 PM 12/20/2021    6:25 PM  CBC  WBC 4.0 - 10.5 K/uL 5.3  5.5  6.2   Hemoglobin 12.0 - 15.0 g/dL 14.1  14.9  13.9   Hematocrit 36.0 - 46.0 % 41.6  45.5  42.1   Platelets 150 - 400 K/uL 266  279  302     Lab Results  Component Value Date   NA 142 12/25/2021   K 3.9 12/25/2021   CL 106  12/25/2021   CO2 25 12/25/2021      Assessment/Plan: Acute encephalopathy: Likely delirium in the setting of progressive but undiagnosed dementia (MRI brain showed significant atrophy).  EEG negative for seizures.  Has significant underlying psych issues that could be contributing as well.  No further recommendations from neurology-unclear baseline but she seems better today.    Dementia: Significant atrophy on neuroimaging-chronic finding-neurology suspects patient has progressive dementia.  TSH/B12 as above-check RPR to complete work-up.  Vitamin B12 deficiency: Continue supplementation.  Bipolar disorder: Appreciate psych input-continue Haldol, propanolol .  Tobacco abuse: Continue transdermal nicotine  BMI: Estimated body mass index is 25.62 kg/m as calculated from the following:   Height as of an earlier encounter on 12/24/21: 5\' 6"  (1.676 m).   Weight as of an earlier encounter on 12/24/21: 72 kg.   Code status:   Code Status: Prior   DVT Prophylaxis: enoxaparin (LOVENOX) injection 40 mg Start: 12/26/21 1200    Family Communication: None at bedside   Disposition Plan: Status is: Observation The patient will require care spanning > 2 midnights and should be moved to inpatient because: Needs memory care unit-safe discharge disposition   Planned Discharge Destination: Needs ongoing discussion with family regarding safe discharge disposition.   Diet: Diet Order             Diet regular Room service appropriate? Yes; Fluid consistency: Thin  Diet effective now  Antimicrobial agents: Anti-infectives (From admission, onward)    None        MEDICATIONS: Scheduled Meds:  cyanocobalamin  1,000 mcg Intramuscular Daily   enoxaparin (LOVENOX) injection  40 mg Subcutaneous Q24H   feeding supplement (KATE FARMS STANDARD 1.4)  325 mL Oral BID BM   haloperidol  1 mg Oral Daily   And   haloperidol  2 mg Oral QHS   hydrocerin   Topical BID    nicotine  14 mg Transdermal Daily   propranolol  10 mg Oral BID   Continuous Infusions: PRN Meds:.acetaminophen, alum & mag hydroxide-simeth, atropine, docusate sodium, fluticasone, hydrOXYzine, LORazepam, magnesium hydroxide, OLANZapine zydis **AND** ziprasidone, ondansetron, polyethylene glycol   I have personally reviewed following labs and imaging studies  LABORATORY DATA: CBC: Recent Labs  Lab 12/20/21 1825 12/24/21 1314 12/25/21 0516  WBC 6.2 5.5 5.3  NEUTROABS 3.6 3.5  --   HGB 13.9 14.9 14.1  HCT 42.1 45.5 41.6  MCV 90.1 92.5 88.9  PLT 302 279 266     Basic Metabolic Panel: Recent Labs  Lab 12/20/21 1825 12/24/21 1507 12/25/21 0516  NA 141 142 142  K 4.3 4.7 3.9  CL 110 105 106  CO2 26 24 25   GLUCOSE 92 99 108*  BUN 14 12 9   CREATININE 1.19* 1.13* 1.05*  CALCIUM 9.1 9.7 9.3     GFR: Estimated Creatinine Clearance: 62.3 mL/min (A) (by C-G formula based on SCr of 1.05 mg/dL (H)).  Liver Function Tests: Recent Labs  Lab 12/20/21 1825 12/24/21 1507  AST 20 28  ALT 14 16  ALKPHOS 58 55  BILITOT 0.6 0.6  PROT 7.2 7.4  ALBUMIN 3.6 3.7    No results for input(s): "LIPASE", "AMYLASE" in the last 168 hours. Recent Labs  Lab 12/24/21 1420  AMMONIA 39*     Coagulation Profile: No results for input(s): "INR", "PROTIME" in the last 168 hours.  Cardiac Enzymes: Recent Labs  Lab 12/24/21 1420 12/25/21 0516  CKTOTAL 263* 256*     BNP (last 3 results) No results for input(s): "PROBNP" in the last 8760 hours.  Lipid Profile: No results for input(s): "CHOL", "HDL", "LDLCALC", "TRIG", "CHOLHDL", "LDLDIRECT" in the last 72 hours.  Thyroid Function Tests: Recent Labs    12/26/21 1748  TSH 1.093     Anemia Panel: Recent Labs    12/24/21 1420  VITAMINB12 290     Urine analysis:    Component Value Date/Time   COLORURINE YELLOW 12/21/2021 1102   APPEARANCEUR CLEAR 12/21/2021 1102   LABSPEC 1.017 12/21/2021 1102   PHURINE 6.0  12/21/2021 1102   GLUCOSEU NEGATIVE 12/21/2021 1102   HGBUR NEGATIVE 12/21/2021 1102   BILIRUBINUR NEGATIVE 12/21/2021 1102   BILIRUBINUR neg 08/07/2016 1526   KETONESUR NEGATIVE 12/21/2021 1102   PROTEINUR NEGATIVE 12/21/2021 1102   UROBILINOGEN 2.0 (A) 08/07/2016 1526   UROBILINOGEN 1 12/30/2013 1216   NITRITE NEGATIVE 12/21/2021 1102   LEUKOCYTESUR NEGATIVE 12/21/2021 1102    Sepsis Labs: Lactic Acid, Venous    Component Value Date/Time   LATICACIDVEN 1.3 12/24/2021 1314    MICROBIOLOGY: Recent Results (from the past 240 hour(s))  Resp Panel by RT-PCR (Flu A&B, Covid) Anterior Nasal Swab     Status: None   Collection Time: 12/24/21  1:42 PM   Specimen: Anterior Nasal Swab  Result Value Ref Range Status   SARS Coronavirus 2 by RT PCR NEGATIVE NEGATIVE Final    Comment: (NOTE) SARS-CoV-2 target nucleic acids are NOT DETECTED.  The SARS-CoV-2 RNA is generally detectable in upper respiratory specimens during the acute phase of infection. The lowest concentration of SARS-CoV-2 viral copies this assay can detect is 138 copies/mL. A negative result does not preclude SARS-Cov-2 infection and should not be used as the sole basis for treatment or other patient management decisions. A negative result may occur with  improper specimen collection/handling, submission of specimen other than nasopharyngeal swab, presence of viral mutation(s) within the areas targeted by this assay, and inadequate number of viral copies(<138 copies/mL). A negative result must be combined with clinical observations, patient history, and epidemiological information. The expected result is Negative.  Fact Sheet for Patients:  BloggerCourse.com  Fact Sheet for Healthcare Providers:  SeriousBroker.it  This test is no t yet approved or cleared by the [place] FDA and  has been authorized for detection and/or diagnosis of SARS-CoV-2 by FDA under an  Emergency Use Authorization (EUA). This EUA will remain  in effect (meaning this test can be used) for the duration of the COVID-19 declaration under Section 564(b)(1) of the Act, 21 U.S.C.section 360bbb-3(b)(1), unless the authorization is terminated  or revoked sooner.       Influenza A by PCR NEGATIVE NEGATIVE Final   Influenza B by PCR NEGATIVE NEGATIVE Final    Comment: (NOTE) The Xpert Xpress SARS-CoV-2/FLU/RSV plus assay is intended as an aid in the diagnosis of influenza from Nasopharyngeal swab specimens and should not be used as a sole basis for treatment. Nasal washings and aspirates are unacceptable for Xpert Xpress SARS-CoV-2/FLU/RSV testing.  Fact Sheet for Patients: BloggerCourse.com  Fact Sheet for Healthcare Providers: SeriousBroker.it  This test is not yet approved or cleared by the [place] FDA and has been authorized for detection and/or diagnosis of SARS-CoV-2 by FDA under an Emergency Use Authorization (EUA). This EUA will remain in effect (meaning this test can be used) for the duration of the COVID-19 declaration under Section 564(b)(1) of the Act, 21 U.S.C. section 360bbb-3(b)(1), unless the authorization is terminated or revoked.  Performed at [facility], [address] [place], [place] [address]     RADIOLOGY STUDIES/RESULTS: CT Angio Chest Pulmonary Embolism (PE) W or WO Contrast  Result Date: 12/26/2021 CLINICAL DATA:  Concern for pulmonary embolism. EXAM: CT ANGIOGRAPHY CHEST WITH CONTRAST TECHNIQUE: Multidetector CT imaging of the chest was performed using the standard protocol during bolus administration of intravenous contrast. Multiplanar CT image reconstructions and MIPs were obtained to evaluate the vascular anatomy. RADIATION DOSE REDUCTION: This exam was performed according to the departmental dose-optimization program which includes automated exposure control, adjustment of the  mA and/or kV according to patient size and/or use of iterative reconstruction technique. CONTRAST:  53mL OMNIPAQUE IOHEXOL 350 MG/ML SOLN COMPARISON:  Chest radiograph dated 12/24/2021. FINDINGS: Cardiovascular: There is no cardiomegaly or pericardial effusion. The thoracic aorta is unremarkable. No pulmonary artery embolus identified. Mediastinum/Nodes: No hilar or mediastinal adenopathy. The esophagus is grossly unremarkable no mediastinal fluid collection. Lungs/Pleura: Background of mild centrilobular emphysema. There are bibasilar subpleural dependent atelectasis. No consolidative changes. There is no pleural effusion or pneumothorax. The central airways are patent. Upper Abdomen: No acute abnormality. Musculoskeletal: No chest wall abnormality. No acute or significant osseous findings. Review of the MIP images confirms the above findings. IMPRESSION: 1. No acute intrathoracic pathology. No CT evidence of pulmonary artery embolus. 2. Emphysema (ICD10-J43.9). Electronically Signed   By: Elgie Collard M.D.   On: 12/26/2021 01:12   Overnight EEG with video  Result Date: 12/25/2021  Lora Havens, MD     12/26/2021  1:36 PM Patient Name: Natalie Brown MRN: [id number] Epilepsy Attending: Lora Havens Referring Physician/Provider: Orma Flaming, MD Duration: 12/24/2021 1831 to 12/25/2021 1831 Patient history: 56 year old female with altered mental status.  EEG to evaluate for seizure. Level of alertness: Awake AEDs during EEG study: None Technical aspects: This EEG study was done with scalp electrodes positioned according to the 10-20 International system of electrode placement. Electrical activity was reviewed with band pass filter of 1-70Hz , sensitivity of 7 uV/mm, display speed of 33mm/sec with a 60Hz  notched filter applied as appropriate. EEG data were recorded continuously and digitally stored.  Video monitoring was available and reviewed as appropriate. Description: The posterior dominant rhythm  consists of 9-10 Hz activity of moderate voltage (25-35 uV) seen predominantly in posterior head regions, symmetric and reactive to eye opening and eye closing. Hyperventilation and photic stimulation were not performed.   Of note, study was not interpretable between 0933 to 1800 IMPRESSION: This study is within normal limits. No seizures or epileptiform discharges were seen throughout the recording. A normal interictal EEG does not exclude nor support the diagnosis of epilepsy. [place]     LOS: 1 day   Oren Binet, MD  [facility]    To contact the attending provider between 7A-7P or the covering provider during after hours 7P-7A, please log into the web site [email or web address] and access using universal [place] password for that web site. If you do not have the password, please call the hospital operator.  12/27/2021, 11:43 AM

## 2021-12-27 NOTE — Consult Note (Signed)
[facility] Psychiatry New Face-to-Face Psychiatric Evaluation   Name: Natalie Brown DOB: Jun 30, 1965 MRN: [id number] Service Date: December 27, 2021 LOS:  LOS: 1 day  Reason for Consult: " Med management" Referring Provider: Bing Neighbors, MD   Assessment  Natalie Brown is a 56 y.o. female admitted medically for 12/24/2021 11:34 PM for possible catatonia/altered mental status. She carries the psychiatric diagnoses of bipolar affective disorder with psychotic symptoms and has a past medical history of hypertension.   Her current presentation of altered mental status is most consistent with dementia, per neurology, given her subpar response to Ativan challenge less likely catatonia, EEG showed no seizures, and imaging shows chronic atrophy, c/w dementia. She has no overt psychosis on my assessment and does not appear internally preoccupied. She DOES NOT meet criteria for inpatient psychiatry; rather, she is more appropriate for a memory care unit.  Current inpatient BH psychotropic medications include Haldol 1 mg every morning and 2 mg nightly, propranolol 10 mg twice daily and historically she has had a good response to these medications. She was compliant with medications prior to admission as evidenced by [facility]. On initial examination, patient is cooperative with a bright affect; she repeats concerns and asks the same questions over and over.   Please see plan below for detailed recommendations.   Diagnoses:  Active Hospital problems: Principal Problem:   Acute encephalopathy Active Problems:   Bipolar affective disorder, current episode manic with psychotic symptoms (HCC)   Tobacco abuse   Altered mental state     Plan  ## Safety and Observation Level:  - Based on my clinical evaluation, I estimate the patient to be at moderate risk of self harm in the current setting of safety unawareness - At this time, we recommend a 1:1 level of observation. This decision is based on  my review of the chart including patient's history and current presentation, interview of the patient, mental status examination, and consideration of suicide risk including evaluating suicidal ideation, plan, intent, suicidal or self-harm behaviors, risk factors, and protective factors. This judgment is based on our ability to directly address suicide risk, implement suicide prevention strategies and develop a safety plan while the patient is in the clinical setting. Please contact our team if there is a concern that risk level has changed.   ## Medications:  --CONTINUE Haldol 1 mg every morning and 2 mg nightly; per [facility], plan to taper as tolerated -CONTINUE propranolol 10 mg twice daily for anxiety and tachycardia - CONTINUE atropine sublingual drops every 8 hours as needed drooling  ## Medical Decision Making Capacity:  Patient has interim legal guardian, mother Natalie Brown [phone])  ## Further Work-up:   -- most recent EKG on 9/5 had QtC of 426 -- Pertinent labwork reviewed earlier this admission includes:  D-dimer 1.55 TSH 1.371 CK 263-> 256 CBC WNL BMP largely WNL, only mildly elevated creatinine Ammonia 39 Lactic acid 1.3  ## Disposition:  --Pending; not recommended to return to [facility] upon discharge  ## Behavioral / Environmental:  -- 1:1, delirium precautions DELIRIUM RECS 1: Avoid benzodiazepines, antihistamines, anticholinergics, and minimize opiate use as these may worsen delirium. 2:Assess, prevent and manage pain as lack of treatment can result in delirium.  3: Recommend consult to PT/OT if not already done. Early mobility and exercise has been shown to decrease duration of delirium.  4:Provide appropriate lighting and clear signage; a clock and calendar should be easily visible to the patient. 5:Monitor environmental factors. Reduce light and noise  at night (close shades, turn off lights, turn off TV, ect). Correct any alterations in sleep cycle. 6: Reorient the patient to  person, place, time and situation on each encounter.  7: Correct sensory deficits if possible (replace eye glasses, hearing aids, ect). 8: Avoid restraints. Severely delirious patients benefit from constant observation by a sitter. 9: Do not leave patient unattended.     ##Legal Status Patient with legal guardian  Thank you for this consult request. Recommendations have been communicated to the primary team.  We will continue to follow at this time.   [name], MD   NEW history  Relevant Aspects of Hospital Course:  Admitted on 12/24/2021 for acute encephalopathy.  Patient Report:  Patient is seen at bedside and is cooperative with assessment. [name] alert and oriented to person, place, month, and year. She denies acute concerns or complaints today. She questions when she will be able to leave the hospital, and she is advised that we are looking into placement options for her. She voices understanding, then repeats the question.  Collateral: Called mom and spoke with her and patient's sister. No one is able to stay with sister beyond 3 days or with mom beyond 2 weeks, and they are the only family members that patient has. They are informed of neurology's assessment that patient did not have seizures and her presentation is more consistent with dementia. Because of this, patient is more so suitable for a memory care unit rather than returning to [facility], as her manic symptoms were treated, and they are in agreement.     Psychiatric History:  Information collected from chart. Bipolar affective disorder with psychotic features, MNCD Admitted to [facility] since Sep 06, 2021  Family psych history: Patient reports mental illness runs in her father's side of the family.   Social History:   Tobacco use: Half PPD; no use since admission Alcohol use: No use since admission Drug use: Denies; UDS negative on admission in May  Family History:  The patient's family history is not on  file.  Medical History: Past Medical History:  Diagnosis Date   Bipolar 1 disorder with moderate mania (HCC) 12/30/2013   Hypertension     Surgical History: No past surgical history on file.  Medications:   Current Facility-Administered Medications:    acetaminophen (TYLENOL) tablet 650 mg, 650 mg, Oral, Q6H PRN, Orland Mustard, MD   alum & mag hydroxide-simeth (MAALOX/MYLANTA) 200-200-20 MG/5ML suspension 30 mL, 30 mL, Oral, Q4H PRN, Orland Mustard, MD   atropine 1 % ophthalmic solution 1 drop, 1 drop, Sublingual, Q8H PRN, Orland Mustard, MD   cyanocobalamin (VITAMIN B12) injection 1,000 mcg, 1,000 mcg, Intramuscular, Daily, Ghimire, Werner Lean, MD, 1,000 mcg at 12/26/21 1321   docusate sodium (COLACE) capsule 100 mg, 100 mg, Oral, Daily PRN, Orland Mustard, MD   enoxaparin (LOVENOX) injection 40 mg, 40 mg, Subcutaneous, Q24H, Ghimire, Werner Lean, MD, 40 mg at 12/26/21 1321   feeding supplement (KATE FARMS STANDARD 1.4) liquid 325 mL, 325 mL, Oral, BID BM, Orland Mustard, MD, 325 mL at 12/26/21 1321   fluticasone (FLONASE) 50 MCG/ACT nasal spray 1 spray, 1 spray, Each Nare, Daily PRN, Orland Mustard, MD   haloperidol (HALDOL) tablet 1 mg, 1 mg, Oral, Daily, 1 mg at 12/26/21 1321 **AND** haloperidol (HALDOL) tablet 2 mg, 2 mg, Oral, QHS, Akacia Boltz, MD, 2 mg at 12/26/21 2057   hydrocerin (EUCERIN) cream, , Topical, BID, Orland Mustard, MD   hydrOXYzine (ATARAX) tablet 25 mg, 25 mg,  Oral, TID PRN, Orland Mustard, MD   LORazepam (ATIVAN) tablet 0.5 mg, 0.5 mg, Oral, Q6H PRN, Orland Mustard, MD   magnesium hydroxide (MILK OF MAGNESIA) suspension 30 mL, 30 mL, Oral, Daily PRN, Orland Mustard, MD   nicotine (NICODERM CQ - dosed in mg/24 hr) patch 14 mg, 14 mg, Transdermal, Daily, Orland Mustard, MD, 14 mg at 12/26/21 0841   OLANZapine zydis (ZYPREXA) disintegrating tablet 5 mg, 5 mg, Oral, Q8H PRN **AND** ziprasidone (GEODON) injection 20 mg, 20 mg, Intramuscular, PRN, Orland Mustard, MD    ondansetron [facility]) tablet 4 mg, 4 mg, Oral, Q8H PRN, Orland Mustard, MD   polyethylene glycol (MIRALAX / GLYCOLAX) packet 17 g, 17 g, Oral, Daily PRN, Orland Mustard, MD   propranolol (INDERAL) tablet 10 mg, 10 mg, Oral, BID, Orland Mustard, MD, 10 mg at 12/26/21 2116  Allergies: Allergies  Allergen Reactions   Penicillins Itching       Objective  Vital signs:  Temp:  [97.7 F (36.5 C)] 97.7 F (36.5 C) (09/06 1936) Pulse Rate:  [86-140] 140 (09/07 0830) Resp:  [12-20] 19 (09/07 0830) BP: (120-144)/(81-99) 130/81 (09/07 0830)  Psychiatric Specialty Exam:  Presentation  General Appearance: Appropriate for Environment; Fairly Groomed  Eye Contact:Good  Speech:Clear and Coherent  Speech Volume:Normal  Handedness:Right   Mood and Affect  Mood:Depressed  Affect:Congruent   Thought Process  Thought Processes:Disorganized  Descriptions of Associations:Intact  Orientation:Partial  Thought Content:Logical  History of Schizophrenia/Schizoaffective disorder:No  Duration of Psychotic Symptoms:N/A  Hallucinations:No data recorded Ideas of Reference:None  Suicidal Thoughts:No data recorded Homicidal Thoughts:No data recorded  Sensorium  Memory:Remote Poor; Recent Poor; Immediate Fair  Judgment:Poor  Insight:Poor   Executive Functions  Concentration:Fair  Attention Span:Fair  Recall:Poor  Fund of Knowledge:Poor  Language:Fair   Psychomotor Activity  Psychomotor Activity:No data recorded  Assets  Assets:Resilience   Sleep  Sleep:No data recorded   Physical Exam: Physical Exam Vitals reviewed.  Constitutional:      General: [name] not in acute distress.    Appearance: [name] not toxic-appearing.  HENT:     Head: Normocephalic and atraumatic.     Mouth/Throat:     Mouth: Mucous membranes are moist.     Pharynx: Oropharynx is clear.  Pulmonary:     Effort: Pulmonary effort is normal.  Skin:    General: Skin is warm and dry.   Neurological:     General: No focal deficit present.     Mental Status: [name] alert and oriented to person, place, and time.     Motor: No weakness.     Gait: Gait normal.    Blood pressure 130/81, pulse (!) 140, temperature 97.7 F (36.5 C), temperature source Oral, resp. rate 19, SpO2 95 %. There is no height or weight on file to calculate BMI.

## 2021-12-28 LAB — VITAMIN B1
Vitamin B1 (Thiamine): 116.3 nmol/L (ref 66.5–200.0)
Vitamin B1 (Thiamine): 112.6 nmol/L (ref 66.5–200.0)

## 2021-12-28 LAB — METHYLMALONIC ACID, SERUM
Methylmalonic Acid, Quantitative: 114 nmol/L (ref 0–378)
Methylmalonic Acid, Quantitative: 165 nmol/L (ref 0–378)

## 2021-12-28 LAB — RPR: RPR Ser Ql: NONREACTIVE

## 2021-12-28 NOTE — TOC Initial Note (Signed)
Transition of Care [facility]) - Initial/Assessment Note    Patient Details  Name: Natalie Brown MRN: [id number] Date of Birth: 10/26/1965  Transition [facility]) CM/SW Contact:    Mearl Latin, LCSW Phone Number: 12/28/2021, 5:29 PM  Clinical Narrative:                 CSW following. Per [facility], patient no longer in need of inpatient psych and she cannot return to [facility]. They are recommending memory care unit, however patient currently without insurance or financial resources for placement. Discussed case with leadership.     Barriers to Discharge: Financial Resources, Inadequate or no insurance, Homeless with medical needs   Patient Goals and CMS Choice Patient states their goals for this hospitalization and ongoing recovery are:: Housing      Expected Discharge Plan and Services   In-house Referral: Clinical Social Work   Post Acute Care Choice: Nursing Home Living arrangements for the past 2 months: Homeless [facility]) Expected Discharge Date: 12/24/21                                    Prior Living Arrangements/Services Living arrangements for the past 2 months: Homeless [facility]) Lives with:: Self Patient language and need for interpreter reviewed:: Yes Do you feel safe going back to the place where you live?: Yes      Need for Family Participation in Patient Care: Yes (Comment) Care giver support system in place?: No (comment)   Criminal Activity/Legal Involvement Pertinent to Current Situation/Hospitalization: No - Comment as needed  Activities of Daily Living      Permission Sought/Granted                  Emotional Assessment Appearance:: Appears stated age Attitude/Demeanor/Rapport: Unable to Assess Affect (typically observed): Unable to Assess Orientation: : Oriented to Self, Oriented to Place Alcohol / Substance Use: Not Applicable Psych Involvement: Yes (comment)  Admission diagnosis:  Acute encephalopathy [G93.40] Altered mental state  [R41.82] Patient Active Problem List   Diagnosis Date Noted   Altered mental state 12/26/2021   Tachycardia 12/25/2021   Tobacco abuse 12/24/2021   Acute encephalopathy 12/24/2021   Major neurocognitive disorder (HCC) 11/11/2021   Bipolar affective disorder, current episode manic with psychotic symptoms (HCC) 09/12/2021   Dental caries 06/29/2015   Cervical cancer screening 02/22/2014   Current smoker 02/22/2014   Urge incontinence of urine 12/30/2013   Need for prophylactic vaccination and inoculation against influenza 12/30/2013   Bipolar 1 disorder (HCC) 12/30/2013   PCP:  Lavinia Sharps, NP Pharmacy:   [facility] [address], [address] [place] [address] Phone: [phone] Fax: [phone]     Social Determinants of Health (SDOH) Interventions    Readmission Risk Interventions     No data to display

## 2021-12-28 NOTE — Progress Notes (Signed)
Occupational Therapy Treatment Patient Details Name: Natalie Brown MRN: [id number] DOB: [birth year] Today's Date: 12/28/2021   History of present illness Patient is a 56 y.o.  female with history of bipolar disorder-who was hospitalized at [facility] received a Ativan challenge for possible catatonia-with minimal improvement-due to persistent altered mental status/tachycardia-patient was admitted to [facility] service for further evaluation and treatment.   OT comments  Pt attempting to get out of bed triggering telesitter response upon therapy arrival. Mother and Sister in room at bedside attempting to keep pt in bed with VC. Pt initially nonverbal when Mother asked her questions (may have been hesitant to respond due to demanding tone). Pt verbalized to patient that she wanted the bed rail down (to throw her trash away) and also that she needed to use the bathroom.  OT session focused on patient led tasks while providing VC for safety, task sequencing and initiation in order to complete self care tasks safely. Placed trash can on right side of bed and rolling tray table on left side of bed to decrease need to exit bed.    Recommendations for follow up therapy are one component of a multi-disciplinary discharge planning process, led by the attending physician.  Recommendations may be updated based on patient status, additional functional criteria and insurance authorization.    Follow Up Recommendations  Outpatient OT    Assistance Recommended at Discharge Frequent or constant Supervision/Assistance  Patient can return home with the following  A little help with bathing/dressing/bathroom;Assistance with cooking/housework;Direct supervision/assist for medications management;Direct supervision/assist for financial management;Assist for transportation;A little help with walking and/or transfers;Help with stairs or ramp for entrance   Equipment Recommendations  None recommended by OT       Precautions /  Restrictions Precautions Precautions: Fall Precaution Comments: EEG monitoring - it was discounted when therapist entered room. Restrictions Weight Bearing Restrictions: No       Mobility Bed Mobility Overal bed mobility: Modified Independent Bed Mobility: Supine to Sit     Supine to sit: HOB elevated, Modified independent (Device/Increase time)       Patient Response: Flat affect  Transfers Overall transfer level: Needs assistance Equipment used: None Transfers: Sit to/from Stand, Bed to chair/wheelchair/BSC Sit to Stand: Supervision     Step pivot transfers: Supervision           Balance Overall balance assessment: Mild deficits observed, not formally tested (several staggered steps demonstrated during session with min assist provided for balance to correct)       ADL either performed or assessed with clinical judgement   ADL Overall ADL's : Needs assistance/impaired Eating/Feeding: Set up;Sitting Eating/Feeding Details (indicate cue type and reason): Mother opened fruit snack bag for patient to eat. During session, pt demonstrates increased drooling indicating possible decreased oral motor skills and sensory processing. Grooming: Wash/dry hands;Applying deodorant;Supervision/safety;Sitting;Standing Grooming Details (indicate cue type and reason): hand washing completed standing at sink. deodorant applied seated on EOB. VC to initiate each task.         Upper Body Dressing : Minimal assistance;Sitting       Toilet Transfer: Min guard;Ambulation;Regular Toilet   Toileting- Clothing Manipulation and Hygiene: Supervision/safety;Sitting/lateral lean         General ADL Comments: min A for verbal cues for attention, initiation, sequencing and termination of task      Cognition Arousal/Alertness: Awake/alert Behavior During Therapy: Impulsive Overall Cognitive Status: Impaired/Different from baseline Area of Impairment: Attention, Following commands,  Safety/judgement, Memory, Awareness, Problem solving  Memory: Decreased recall of precautions, Decreased short-term memory Following Commands: Follows one step commands inconsistently Safety/Judgement: Decreased awareness of safety, Decreased awareness of deficits   Problem Solving: Requires verbal cues, Decreased initiation                General Comments VSS on RA. HR 110    Pertinent Vitals/ Pain       Pain Assessment Pain Assessment: No/denies pain Pain Score: 0-No pain         Frequency  Min 2X/week        Progress Toward Goals  OT Goals(current goals can now be found in the care plan section)  Progress towards OT goals: Progressing toward goals     Plan Discharge plan remains appropriate;Frequency remains appropriate       AM-PAC OT "6 Clicks" Daily Activity     Outcome Measure   Help from another person eating meals?: A Little Help from another person taking care of personal grooming?: A Little Help from another person toileting, which includes using toliet, bedpan, or urinal?: A Little Help from another person bathing (including washing, rinsing, drying)?: A Little Help from another person to put on and taking off regular upper body clothing?: A Little Help from another person to put on and taking off regular lower body clothing?: A Little 6 Click Score: 18    End of Session Equipment Utilized During Treatment: Gait belt  OT Visit Diagnosis: Muscle weakness (generalized) (M62.81);History of falling (Z91.81)   Activity Tolerance Patient tolerated treatment well   Patient Left in bed;with call bell/phone within reach;with bed alarm set;with family/visitor present;Other (comment) (telesitter in room)   Nurse Communication          Time: [phone] OT Time Calculation (min): 21 min  Charges: OT General Charges $OT Visit: 1 Visit OT Treatments $Self Care/Home Management : 8-22 mins  Limmie Patricia, OTR/L,CBIS  Supplemental OT - [facility] and  [facility]   Robel Wuertz, Charisse March 12/28/2021, 4:38 PM

## 2021-12-28 NOTE — Progress Notes (Signed)
Physical Therapy Treatment Patient Details Name: Natalie Brown MRN: [id number] DOB: Dec 05, 1965 Today's Date: 12/28/2021   History of Present Illness Patient is a 56 y.o.  female with history of bipolar disorder-who was hospitalized at [facility] received a Ativan challenge for possible catatonia-with minimal improvement-due to persistent altered mental status/tachycardia-patient was admitted to [facility] service for further evaluation and treatment.    PT Comments    Pt doing well with mobility. Just needing supervision for safety due to cognitive/mental health issues. Will likely dc from PT soon.    Recommendations for follow up therapy are one component of a multi-disciplinary discharge planning process, led by the attending physician.  Recommendations may be updated based on patient status, additional functional criteria and insurance authorization.  Follow Up Recommendations  No PT follow up     Assistance Recommended at Discharge Intermittent Supervision/Assistance  Patient can return home with the following Direct supervision/assist for medications management;Direct supervision/assist for financial management;Assist for transportation   Equipment Recommendations  None recommended by PT    Recommendations for Other Services       Precautions / Restrictions Precautions Precautions: Fall Restrictions Weight Bearing Restrictions: No     Mobility  Bed Mobility Overal bed mobility: Modified Independent             General bed mobility comments: Modified independent when she initiates the movement. Needed repeated verbal cues when asked to mobilize on command    Transfers Overall transfer level: Needs assistance Equipment used: None Transfers: Sit to/from Stand Sit to Stand: Supervision           General transfer comment: supervision for safety    Ambulation/Gait Ambulation/Gait assistance: Supervision Gait Distance (Feet): 300 Feet Assistive device: None Gait  Pattern/deviations: Step-through pattern Gait velocity: decr Gait velocity interpretation: 1.31 - 2.62 ft/sec, indicative of limited community ambulator   General Gait Details: supervision for safety and to keep pt on task when distracted by environment   Stairs             Wheelchair Mobility    Modified Rankin (Stroke Patients Only)       Balance Overall balance assessment: Needs assistance Sitting-balance support: Feet supported Sitting balance-Leahy Scale: Good     Standing balance support: No upper extremity supported, During functional activity Standing balance-Leahy Scale: Good                              Cognition Arousal/Alertness: Awake/alert Behavior During Therapy: Impulsive Overall Cognitive Status: No family/caregiver present to determine baseline cognitive functioning                                 General Comments: slow processing, poor attention and easily distracted        Exercises      General Comments General comments (skin integrity, edema, etc.): VSS on RA. HR 110      Pertinent Vitals/Pain Pain Assessment Pain Assessment: Faces Faces Pain Scale: No hurt    Home Living                          Prior Function            PT Goals (current goals can now be found in the care plan section) Progress towards PT goals: Progressing toward goals    Frequency    Min 3X/week  PT Plan Current plan remains appropriate    Co-evaluation              AM-PAC PT "6 Clicks" Mobility   Outcome Measure  Help needed turning from your back to your side while in a flat bed without using bedrails?: None Help needed moving from lying on your back to sitting on the side of a flat bed without using bedrails?: None Help needed moving to and from a bed to a chair (including a wheelchair)?: A Little Help needed standing up from a chair using your arms (e.g., wheelchair or bedside chair)?: A  Little Help needed to walk in hospital room?: A Little Help needed climbing 3-5 steps with a railing? : A Little 6 Click Score: 20    End of Session Equipment Utilized During Treatment: Gait belt Activity Tolerance: Patient tolerated treatment well Patient left: in bed;with call bell/phone within reach;with bed alarm set;Other (comment) (telesitter present) Nurse Communication: Mobility status (nurse tech) PT Visit Diagnosis: Other abnormalities of gait and mobility (R26.89)     Time: 7262-0355 PT Time Calculation (min) (ACUTE ONLY): 19 min  Charges:  $Gait Training: 8-22 mins                     [facility] PT Acute Rehabilitation Services Office [phone]    Angelina Ok [facility] 12/28/2021, 2:14 PM

## 2021-12-28 NOTE — Discharge Summary (Addendum)
Physician Discharge Summary Note  Patient:  Natalie Brown is an 56 y.o., female MRN:  [id number] DOB:  [birth year] Patient phone:  [phone] (home)  Patient address:   [address] [place] [address],  Total Time spent with patient: 45 minutes  Date of Admission:  09/07/2021 Date of Discharge: 12/24/2021  Reason for Admission:  Natalie QUEBEDEAUX is a 56 y.o. female  who was initially admitted for inpatient psychiatric hospitalization on 09/06/2021 for management of mania, disorganized behaviors/thinking, and delusions.  Principal Problem: Acute encephalopathy Discharge Diagnoses: Principal Problem:   Acute encephalopathy Active Problems:   Bipolar affective disorder, current episode manic with psychotic symptoms (HCC)   Tobacco abuse   Altered mental state    Past Psychiatric History: see H&P  Past Medical History:  Past Medical History:  Diagnosis Date   Bipolar 1 disorder with moderate mania (HCC) 12/30/2013   Hypertension    No past surgical history on file. Family History: No family history on file.  Social History:  Social History   Substance and Sexual Activity  Alcohol Use No     Social History   Substance and Sexual Activity  Drug Use No    Social History   Socioeconomic History   Marital status: Married    Spouse name: Not on file   Number of children: Not on file   Years of education: Not on file   Highest education level: Not on file  Occupational History   Not on file  Tobacco Use   Smoking status: Every Day   Smokeless tobacco: Current  Substance and Sexual Activity   Alcohol use: No   Drug use: No   Sexual activity: Not on file  Other Topics Concern   Not on file  Social History Narrative   Not on file   Social Determinants of Health   Financial Resource Strain: Not on file  Food Insecurity: Not on file  Transportation Needs: Not on file  Physical Activity: Not on file  Stress: Not on file  Social  Connections: Not on file    Hospital Course:    During the patient's hospitalization, patient had extensive initial psychiatric evaluation, and follow-up psychiatric evaluations every day.   Psychiatric diagnoses provided upon initial assessment:  Bipolar I MRE manic with psychotic features  Major neurocognitive disorder with behavioral disturbance (marked for age brain atrophy on CT head and MOCA 11/30)   Please note at time of admission patient presented tangential anxious psychotic and delusional grandiose regard to her drop and education unable to maintain or provide straight a story or reliable history also reported passive SI without any active intention or plan at time of admission.   Patient's psychiatric medications were adjusted on admission: - Continue Olanzapine 5 mg po Q bedtime for bipolar disorder. - Initiated Trileptal 300 mg po bid for mood stability. - Continue Trazodone 50 mg po Q bedtime prn for insomnia. - Continue Hydroxyzine 25 mg po tid prn for anxiety.      During the hospitalization, other adjustments were made to the patient's psychiatric medication regimen: Primarily Zyprexa was titrated up to address bipolar disorder mania and psychosis which was switched later on secondary to lack of efficacy to Haldol with good efficacy noted, later during hospital stay Haldol dose was decreased and patient was started on Invega orally and later Invega monthly injection per mother's request and agreed, multiple attempts to lower or stop Haldol resulted in worsening psychosis.  Prior to discharge  patient's psychosis was stabilized on Haldol 1 mg in the morning and 2 mg at bedtime and [place] injection monthly. Patient also was started on Depakote for mood stabilization but later it was discontinued secondary to abnormal liver function and a slightly elevated ammonia level. During this hospital stay please note patient's psychosis and mania was stabilized after first of 3 to  4 weeks and the patient was stable psychiatrically otherwise but unfortunately secondary to being homeless and having no place to go he was not discharged, it was noted that patient discharged to shelter carries high risk of rapid decompensation.  During this hospital stay it was also noted patient was having memory problem consistent with dementia, MoCA testing was performed at patient is scored 11/30 consistent with dementia diagnosis, also CT scan was obtained resolved consistent with dementia diagnosis. After stabilized psychiatrically patient did continue to stay in the hospital for difficult discharge planning as noted above.  Patient's mother filed for custody and was able to obtain custody but unfortunately patient did not get any benefits to facilitate placement to group home facility. On Monday 9/4 it was noted that patient was increasingly confused, questionably delirious and was sent to emergency room to rule out organic reason for encephalopathy. Please note patient will benefit from placement to memory care unit given diagnosis of dementia.   After stabilized psychiatrically as noted above patient was able to attend the groups fairly fine with limited participation secondary to dementia and baseline confusion.   The patient denied having side effects to prescribed psychiatric medication.   The patient reports their target psychiatric symptoms of mood instability, paranoia and psychosis responded well to the psychiatric medications, and the patient reports overall benefit over psychiatric hospitalization. Supportive psychotherapy was provided to the patient. The patient also participated in regular group therapy while admitted.    Labs were reviewed with the patient, and abnormal results were discussed with the patient.   The patient denied having suicidal thoughts more than 48 hours prior to discharge.  Patient denies having homicidal thoughts.  Patient denies having auditory  hallucinations.  Patient denies any visual hallucinations.  Patient denies having paranoid thoughts.   The patient is able to verbalize their individual safety plan to this provider.   It is recommended to the patient to continue psychiatric medications as prescribed, after discharge from the hospital.     It is recommended to the patient to follow up with your outpatient psychiatric provider and PCP.     Total Time spent with patient: 45 minutes   Musculoskeletal: Strength & Muscle Tone: decreased Gait & Station: unsteady Patient leans: N/A   Psychiatric Specialty Exam   General Appearance: unkempt appearing  - wearing hair bonnet, wearing same dirty clothes for last several days - face appears unwashed and mildly malodorous   Eye Contact:Fair - glaring quality at times   Speech: speaks in short phrases to direct questions   Speech Volume:Normal   Mood and Affect  Mood:appears mildly anxious and confused   Affect: calm, polite, confused appearing   Thought Process  Thought Processes: concrete, confused at times   Orientation: Partially oriented to time place and situation   Thought Content: Denies SI HI or AVH, does not present paranoid or responding to stimuli. Hallucinations:Denied   Ideas of Reference:Denied   Suicidal Thoughts:Denied     Homicidal Thoughts:Denied     Sensorium  Memory:Poor   Judgment:Poor   Insight:Poor   Executive Functions  Concentration:Poor   Attention Span:Poor  Recall:Poor   Fund of Knowledge:Limited   Language:Fair   Psychomotor Activity  Psychomotor Activity:no restlessness, akathisia or tremors noted, AIMS 0      Physical Findings: AIMS:   ,  ,   ,   ,     CIWA:    COWS:     Musculoskeletal: Strength & Muscle Tone: decreased Gait & Station: unsteady Patient leans: N/A   Sleep  Sleep:No data recorded    Physical Exam: Physical Exam ROS Blood pressure (!) 120/91, pulse 68, temperature 98.3 F  (36.8 C), temperature source Oral, resp. rate 16, SpO2 98 %. There is no height or weight on file to calculate BMI.   Social History   Tobacco Use  Smoking Status Every Day  Smokeless Tobacco Current   Tobacco Cessation:  N/A, patient does not currently use tobacco products   Blood Alcohol level:  Lab Results  Component Value Date   ETH <10 09/05/2021    Metabolic Disorder Labs:  Lab Results  Component Value Date   HGBA1C 5.3 09/08/2021   MPG 105.41 09/08/2021   No results found for: "PROLACTIN" Lab Results  Component Value Date   CHOL 173 09/08/2021   TRIG 110 09/08/2021   HDL 50 09/08/2021   CHOLHDL 3.5 09/08/2021   VLDL 22 09/08/2021   LDLCALC 101 (H) 09/08/2021   LDLCALC 102 (H) 06/21/2020    See Psychiatric Specialty Exam and Suicide Risk Assessment completed by Attending Physician prior to discharge.  Discharge destination:  Other:  Transfer to ED  Is patient on multiple antipsychotic therapies at discharge:  No   Has Patient had three or more failed trials of antipsychotic monotherapy by history:  No  Recommended Plan for Multiple Antipsychotic Therapies: NA       Follow-up recommendations:   Patient transferred to ED for medical clearance and evaluation of acute encephalopathy.    SignedSarita Bottom, MD 12/28/2021, 12:15 PM

## 2021-12-28 NOTE — Discharge Summary (Deleted)
  The note originally documented on this encounter has been moved the the encounter in which it belongs.  

## 2021-12-28 NOTE — [facility] Suicide Risk Assessment (Addendum)
[facility] Discharge Suicide Risk Assessment   Principal Problem: Acute encephalopathy Discharge Diagnoses: Principal Problem:   Acute encephalopathy Active Problems:   Bipolar affective disorder, current episode manic with psychotic symptoms (HCC)   Tobacco abuse   Altered mental state   Reason for Admission: Natalie Brown is a 56 y.o. female  who was initially admitted for inpatient psychiatric hospitalization on 09/06/2021 for management of mania, disorganized behaviors/thinking, and delusions.  Hospital Summary During the patient's hospitalization, patient had extensive initial psychiatric evaluation, and follow-up psychiatric evaluations every day.  Psychiatric diagnoses provided upon initial assessment:  Bipolar I MRE manic with psychotic features  Major neurocognitive disorder with behavioral disturbance (marked for age brain atrophy on CT head and MOCA 11/30)  Please note at time of admission patient presented tangential anxious psychotic and delusional grandiose regard to her drop and education unable to maintain or provide straight a story or reliable history also reported passive SI without any active intention or plan at time of admission.  Patient's psychiatric medications were adjusted on admission: - Continue Olanzapine 5 mg po Q bedtime for bipolar disorder. - Initiated Trileptal 300 mg po bid for mood stability. - Continue Trazodone 50 mg po Q bedtime prn for insomnia. - Continue Hydroxyzine 25 mg po tid prn for anxiety.    During the hospitalization, other adjustments were made to the patient's psychiatric medication regimen: Primarily Zyprexa was titrated up to address bipolar disorder mania and psychosis which was switched later on secondary to lack of efficacy to Haldol with good efficacy noted, later during hospital stay Haldol dose was decreased and patient was started on Invega orally and later Invega monthly injection per mother's request and agreed, multiple attempts  to lower or stop Haldol resulted in worsening psychosis.  Prior to discharge patient's psychosis was stabilized on Haldol 1 mg in the morning and 2 mg at bedtime and [place] injection monthly. Patient also was started on Depakote for mood stabilization but later it was discontinued secondary to abnormal liver function and a slightly elevated ammonia level. During this hospital stay please note patient's psychosis and mania was stabilized after first of 3 to 4 weeks and the patient was stable psychiatrically otherwise but unfortunately secondary to being homeless and having no place to go he was not discharged, it was noted that patient discharged to shelter carries high risk of rapid decompensation.  During this hospital stay it was also noted patient was having memory problem consistent with dementia, MoCA testing was performed at patient is scored 11/30 consistent with dementia diagnosis, also CT scan was obtained resolved consistent with dementia diagnosis. After stabilized psychiatrically patient did continue to stay in the hospital for difficult discharge planning as noted above.  Patient's mother filed for custody and was able to obtain custody but unfortunately patient did not get any benefits to facilitate placement to group home facility. On Monday 9/4 it was needed that patient was increasingly confused, questionably delirious and was sent to emergency room to rule out organic reason for encephalopathy. Please note patient will benefit from placement to memory care unit given diagnosis of dementia.  After stabilized psychiatrically as noted above patient was able to attend the groups fairly fine with limited participation secondary to dementia and baseline confusion.  The patient denied having side effects to prescribed psychiatric medication.  The patient reports their target psychiatric symptoms of mood instability, paranoia and psychosis responded well to the psychiatric medications,  and the patient reports overall benefit over psychiatric  hospitalization. Supportive psychotherapy was provided to the patient. The patient also participated in regular group therapy while admitted.   Labs were reviewed with the patient, and abnormal results were discussed with the patient.  The patient denied having suicidal thoughts more than 48 hours prior to discharge.  Patient denies having homicidal thoughts.  Patient denies having auditory hallucinations.  Patient denies any visual hallucinations.  Patient denies having paranoid thoughts.  The patient is able to verbalize their individual safety plan to this provider.  It is recommended to the patient to continue psychiatric medications as prescribed, after discharge from the hospital.    It is recommended to the patient to follow up with your outpatient psychiatric provider and PCP.   Total Time spent with patient: 45 minutes  Musculoskeletal: Strength & Muscle Tone: decreased Gait & Station: unsteady Patient leans: N/A  Psychiatric Specialty Exam  General Appearance: unkempt appearing  - wearing hair bonnet, wearing same dirty clothes for last several days - face appears unwashed and mildly malodorous   Eye Contact:Fair - glaring quality at times   Speech: speaks in short phrases to direct questions   Speech Volume:Normal   Mood and Affect  Mood:appears mildly anxious and confused   Affect: calm, polite, confused appearing   Thought Process  Thought Processes: concrete, confused at times   Orientation: Partially oriented to time place and situation   Thought Content: Denies SI HI or AVH, does not present paranoid or responding to stimuli. Hallucinations:Denied   Ideas of Reference:Denied   Suicidal Thoughts:Denied     Homicidal Thoughts:Denied     Sensorium  Memory:Poor   Judgment:Poor   Insight:Poor   Executive Functions  Concentration:Poor   Attention Span:Poor   Recall:Poor   Fund of  Knowledge:Limited   Language:Fair   Psychomotor Activity  Psychomotor Activity:no restlessness, akathisia or tremors noted, AIMS 0    Assets  Assets:Resilience    Sleep  Sleep:No data recorded  Physical Exam: Physical Exam Review of Systems  Unable to perform ROS: Dementia   Blood pressure (!) 120/91, pulse 68, temperature 98.3 F (36.8 C), temperature source Oral, resp. rate 16, SpO2 98 %. There is no height or weight on file to calculate BMI.  Mental Status Per Nursing Assessment::   On Admission:     Demographic Factors:  Low socioeconomic status and Unemployed  Loss Factors: Decrease in vocational status, Decline in physical health, and Financial problems/change in socioeconomic status  Historical Factors: Impulsivity  Risk Reduction Factors:   Positive social support  Continued Clinical Symptoms:  Bipolar Disorder:   Mixed State More than one psychiatric diagnosis Previous Psychiatric Diagnoses and Treatments Medical Diagnoses and Treatments/Surgeries  Cognitive Features That Contribute To Risk:  Loss of executive function    Suicide Risk:  Moderate:  Frequent suicidal ideation with limited intensity, and duration, some specificity in terms of plans, no associated intent, good self-control, limited dysphoria/symptomatology, some risk factors present, and identifiable protective factors, including available and accessible social support.     Plan Of Care/Follow-up recommendations:  Patient transferred to ED for medical clearance and evaluation of acute encephalopathy.   Bergen Magner Abbott Pao, MD 12/28/2021, 12:01 PM

## 2021-12-28 NOTE — Plan of Care (Signed)
  Problem: Safety: Goal: Non-violent Restraint(s) Outcome: Not Progressing   Problem: Safety: Goal: Ability to remain free from injury will improve Outcome: Not Progressing

## 2021-12-28 NOTE — Progress Notes (Signed)
PROGRESS NOTE        PATIENT DETAILS Name: Natalie Brown Age: 57 y.o. Sex: female Date of Birth: Oct 10, 1965 Admit Date: 12/24/2021 Admitting Physician Dewayne Shorter Levora Dredge, MD [id number], [name], NP  Brief Summary: Patient is a 56 y.o.  female with history of bipolar disorder-who was hospitalized at [facility] received a Ativan challenge for possible catatonia-with minimal improvement-due to persistent altered mental status/tachycardia-patient was admitted to [facility] service for further evaluation and treatment.  Significant events: 9/4>> transferred to [facility]-from [facility]-Altered-no improvement after Ativan challenge.  Significant studies: 9/4>> vitamin B12: 290 9/4>> CXR: No obvious pneumonia 9/4>> MRI brain: No acute intracranial abnormality.  Advanced cerebral/cerebellar atrophy 9/4-9/5>> LTM EEG: No seizures. 9/5>> TSH: 1.3 9/5-9/6>> LTM EEG: Negative. 9/6>> CTA chest: No PE-no PNA.  Significant microbiology data: None  Procedures: None   Consults: Neurology, psychiatry  Subjective: Pleasantly confused.  Objective: Vitals: Blood pressure (!) 120/91, pulse 68, temperature 98.3 F (36.8 C), temperature source Oral, resp. rate 16, SpO2 98 %.   Exam: Gen Exam: Confused not in any distress HEENT:atraumatic, normocephalic Chest: B/L clear to auscultation anteriorly CVS:S1S2 regular Abdomen:soft non tender, non distended Extremities:no edema Neurology: Non focal Skin: no rash   Pertinent Labs/Radiology:    Latest Ref Rng & Units 12/25/2021    5:16 AM 12/24/2021    1:14 PM 12/20/2021    6:25 PM  CBC  WBC 4.0 - 10.5 K/uL 5.3  5.5  6.2   Hemoglobin 12.0 - 15.0 g/dL 47.6  54.6  50.3   Hematocrit 36.0 - 46.0 % 41.6  45.5  42.1   Platelets 150 - 400 K/uL 266  279  302     Lab Results  Component Value Date   NA 142 12/25/2021   K 3.9 12/25/2021   CL 106 12/25/2021   CO2 25 12/25/2021      Assessment/Plan: Acute encephalopathy: Likely delirium  in the setting of progressive but undiagnosed dementia (MRI brain showed significant atrophy).  EEG negative for seizures.  Has significant underlying psych issues that could be contributing as well.  No further recommendations from neurology-unclear baseline but she seems better today.    Dementia: Significant atrophy on neuroimaging-chronic finding-neurology suspects patient has progressive dementia.  TSH/RPR stable-B12 being supplemented.  Vitamin B12 deficiency: Continue supplementation.  Bipolar disorder: Appreciate psych input-continue Haldol, propanolol .  Tobacco abuse: Continue transdermal nicotine  BMI: Estimated body mass index is 25.62 kg/m as calculated from the following:   Height as of an earlier encounter on 12/24/21: 5\' 6"  (1.676 m).   Weight as of an earlier encounter on 12/24/21: 72 kg.   Code status:   Code Status: Prior   DVT Prophylaxis: enoxaparin (LOVENOX) injection 40 mg Start: 12/26/21 1200    Family Communication: None at bedside   Disposition Plan: Status is: Observation The patient will require care spanning > 2 midnights and should be moved to inpatient because: Needs memory care unit-no safe discharge disposition   Planned Discharge Destination: Memory care/SNF-awaiting bed.  Remains medically stable for transfer.  Diet: Diet Order             Diet regular Room service appropriate? Yes; Fluid consistency: Thin  Diet effective now                     Antimicrobial agents: Anti-infectives (From admission, onward)  None        MEDICATIONS: Scheduled Meds:  cyanocobalamin  1,000 mcg Intramuscular Daily   enoxaparin (LOVENOX) injection  40 mg Subcutaneous Q24H   feeding supplement (KATE FARMS STANDARD 1.4)  325 mL Oral BID BM   haloperidol  1 mg Oral Daily   And   haloperidol  2 mg Oral QHS   hydrocerin   Topical BID   nicotine  14 mg Transdermal Daily   propranolol  10 mg Oral BID   Continuous Infusions: PRN  Meds:.acetaminophen, alum & mag hydroxide-simeth, atropine, docusate sodium, fluticasone, hydrOXYzine, LORazepam, magnesium hydroxide, OLANZapine zydis **AND** ziprasidone, ondansetron, polyethylene glycol   I have personally reviewed following labs and imaging studies  LABORATORY DATA: CBC: Recent Labs  Lab 12/24/21 1314 12/25/21 0516  WBC 5.5 5.3  NEUTROABS 3.5  --   HGB 14.9 14.1  HCT 45.5 41.6  MCV 92.5 88.9  PLT 279 266     Basic Metabolic Panel: Recent Labs  Lab 12/24/21 1507 12/25/21 0516  NA 142 142  K 4.7 3.9  CL 105 106  CO2 24 25  GLUCOSE 99 108*  BUN 12 9  CREATININE 1.13* 1.05*  CALCIUM 9.7 9.3     GFR: Estimated Creatinine Clearance: 62.3 mL/min (A) (by C-G formula based on SCr of 1.05 mg/dL (H)).  Liver Function Tests: Recent Labs  Lab 12/24/21 1507  AST 28  ALT 16  ALKPHOS 55  BILITOT 0.6  PROT 7.4  ALBUMIN 3.7    No results for input(s): "LIPASE", "AMYLASE" in the last 168 hours. Recent Labs  Lab 12/24/21 1420  AMMONIA 39*     Coagulation Profile: No results for input(s): "INR", "PROTIME" in the last 168 hours.  Cardiac Enzymes: Recent Labs  Lab 12/24/21 1420 12/25/21 0516  CKTOTAL 263* 256*     BNP (last 3 results) No results for input(s): "PROBNP" in the last 8760 hours.  Lipid Profile: No results for input(s): "CHOL", "HDL", "LDLCALC", "TRIG", "CHOLHDL", "LDLDIRECT" in the last 72 hours.  Thyroid Function Tests: Recent Labs    12/26/21 1748  TSH 1.093     Anemia Panel: No results for input(s): "VITAMINB12", "FOLATE", "FERRITIN", "TIBC", "IRON", "RETICCTPCT" in the last 72 hours.   Urine analysis:    Component Value Date/Time   COLORURINE YELLOW 12/21/2021 1102   APPEARANCEUR CLEAR 12/21/2021 1102   LABSPEC 1.017 12/21/2021 1102   PHURINE 6.0 12/21/2021 1102   GLUCOSEU NEGATIVE 12/21/2021 1102   HGBUR NEGATIVE 12/21/2021 1102   BILIRUBINUR NEGATIVE 12/21/2021 1102   BILIRUBINUR neg 08/07/2016 1526    KETONESUR NEGATIVE 12/21/2021 1102   PROTEINUR NEGATIVE 12/21/2021 1102   UROBILINOGEN 2.0 (A) 08/07/2016 1526   UROBILINOGEN 1 12/30/2013 1216   NITRITE NEGATIVE 12/21/2021 1102   LEUKOCYTESUR NEGATIVE 12/21/2021 1102    Sepsis Labs: Lactic Acid, Venous    Component Value Date/Time   LATICACIDVEN 1.3 12/24/2021 1314    MICROBIOLOGY: Recent Results (from the past 240 hour(s))  Resp Panel by RT-PCR (Flu A&B, Covid) Anterior Nasal Swab     Status: None   Collection Time: 12/24/21  1:42 PM   Specimen: Anterior Nasal Swab  Result Value Ref Range Status   SARS Coronavirus 2 by RT PCR NEGATIVE NEGATIVE Final    Comment: (NOTE) SARS-CoV-2 target nucleic acids are NOT DETECTED.  The SARS-CoV-2 RNA is generally detectable in upper respiratory specimens during the acute phase of infection. The lowest concentration of SARS-CoV-2 viral copies this assay can detect is 138 copies/mL. A  negative result does not preclude SARS-Cov-2 infection and should not be used as the sole basis for treatment or other patient management decisions. A negative result may occur with  improper specimen collection/handling, submission of specimen other than nasopharyngeal swab, presence of viral mutation(s) within the areas targeted by this assay, and inadequate number of viral copies(<138 copies/mL). A negative result must be combined with clinical observations, patient history, and epidemiological information. The expected result is Negative.  Fact Sheet for Patients:  BloggerCourse.com  Fact Sheet for Healthcare Providers:  SeriousBroker.it  This test is no t yet approved or cleared by the [place] FDA and  has been authorized for detection and/or diagnosis of SARS-CoV-2 by FDA under an Emergency Use Authorization (EUA). This EUA will remain  in effect (meaning this test can be used) for the duration of the COVID-19 declaration under Section  564(b)(1) of the Act, 21 U.S.C.section 360bbb-3(b)(1), unless the authorization is terminated  or revoked sooner.       Influenza A by PCR NEGATIVE NEGATIVE Final   Influenza B by PCR NEGATIVE NEGATIVE Final    Comment: (NOTE) The Xpert Xpress SARS-CoV-2/FLU/RSV plus assay is intended as an aid in the diagnosis of influenza from Nasopharyngeal swab specimens and should not be used as a sole basis for treatment. Nasal washings and aspirates are unacceptable for Xpert Xpress SARS-CoV-2/FLU/RSV testing.  Fact Sheet for Patients: BloggerCourse.com  Fact Sheet for Healthcare Providers: SeriousBroker.it  This test is not yet approved or cleared by the [place] FDA and has been authorized for detection and/or diagnosis of SARS-CoV-2 by FDA under an Emergency Use Authorization (EUA). This EUA will remain in effect (meaning this test can be used) for the duration of the COVID-19 declaration under Section 564(b)(1) of the Act, 21 U.S.C. section 360bbb-3(b)(1), unless the authorization is terminated or revoked.  Performed at [facility], [address] [place], [place] [address]     RADIOLOGY STUDIES/RESULTS: No results found.   LOS: 2 days   Jeoffrey Massed, MD  [facility]    To contact the attending provider between 7A-7P or the covering provider during after hours 7P-7A, please log into the web site [email or web address] and access using universal [place] password for that web site. If you do not have the password, please call the hospital operator.  12/28/2021, 12:56 PM

## 2021-12-29 LAB — D-DIMER, QUANTITATIVE: D-Dimer, Quant: 1.21 ug/mL-FEU — ABNORMAL HIGH (ref 0.00–0.50)

## 2021-12-29 NOTE — Progress Notes (Signed)
Pt up to the bathroom, with full assistance and redirection. Totally standby assistance and pt was not left alone and can be validated with Hil[facility] or locator data. Pt returning from, bathroom and lost balance, moving too fast. Attempt to catch patient and pt fell on side. Pt did not hit head and immediately return to her feet and ambulated to the bed. NT assist with support upon standing. Pt was able to do most of the active movement to her feet with minimum assist. Pt refuses to use a walker and is impulsive when getting out of bed. Tele sitter in room, however, writer was present during fall. Pt VSs stable, CN updated, MD called by CN and orders received for beside sitter. However, pt given bedtimes and will follow-up with effectiveness and writer will sit near pt to maintain safety for the duration of the shift. SRP, RN

## 2021-12-29 NOTE — Plan of Care (Signed)
  Problem: Safety: Goal: Non-violent Restraint(s) Outcome: Progressing   Problem: Education: Goal: Knowledge of General Education information will improve Description: Including pain rating scale, medication(s)/side effects and non-pharmacologic comfort measures Outcome: Progressing   Problem: Health Behavior/Discharge Planning: Goal: Ability to manage health-related needs will improve Outcome: Progressing   

## 2021-12-29 NOTE — Progress Notes (Signed)
PROGRESS NOTE        PATIENT DETAILS Name: Natalie Brown Age: 56 y.o. Sex: female Date of Birth: [birth year] Admit Date: 12/24/2021 Admitting Physician Dewayne Shorter Levora Dredge, MD [id number], [name], NP  Brief Summary: Patient is a 56 y.o.  female with history of bipolar disorder-who was hospitalized at [facility] received a Ativan challenge for possible catatonia-with minimal improvement-due to persistent altered mental status/tachycardia-patient was admitted to [facility] service for further evaluation and treatment.  Significant events: 9/4>> transferred to [facility]-from [facility]-Altered-no improvement after Ativan challenge.  Significant studies: 9/4>> vitamin B12: 290 9/4>> CXR: No obvious pneumonia 9/4>> MRI brain: No acute intracranial abnormality.  Advanced cerebral/cerebellar atrophy 9/4-9/5>> LTM EEG: No seizures. 9/5>> TSH: 1.3 9/5-9/6>> LTM EEG: Negative. 9/6>> CTA chest: No PE-no PNA.  Significant microbiology data: None  Procedures: None   Consults: Neurology, psychiatry  Subjective:  In bed appears to be in no distress eating breakfast, denies any headache chest or abdominal pain.  Wants another bacon.  Objective: Vitals: Blood pressure 112/69, pulse 90, temperature 98 F (36.7 C), temperature source Axillary, resp. rate 16, SpO2 96 %.   Exam:  Awake, mildly confused, no focal deficits, in no distress [place].AT,PERRAL Supple Neck, No JVD,   Symmetrical Chest wall movement, Good air movement bilaterally, CTAB RRR,No Gallops, Rubs or new Murmurs,  +ve B.Sounds, Abd Soft, No tenderness,   No Cyanosis, Clubbing or edema   Assessment/Plan:  Acute encephalopathy: Likely delirium in the setting of progressive but undiagnosed dementia (MRI brain showed significant atrophy).  EEG negative for seizures.  Has significant underlying psych issues that could be contributing as well.  No further recommendations from neurology-unclear baseline but she seems better  today.    Elevated D-dimer few days ago.  Repeat if still high check lower extremity venous duplex.  Clinically no signs of PE.    Dementia: Significant atrophy on neuroimaging-chronic finding-neurology suspects patient has progressive dementia.  TSH/RPR stable-B12 being supplemented.  Vitamin B12 deficiency: Continue supplementation.  Bipolar disorder: Appreciate psych input-continue Haldol, propanolol .  Tobacco abuse: Continue transdermal nicotine  BMI: Estimated body mass index is 25.62 kg/m as calculated from the following:   Height as of an earlier encounter on 12/24/21: 5\' 6"  (1.676 m).   Weight as of an earlier encounter on 12/24/21: 72 kg.   Code status:   Code Status: Prior   DVT Prophylaxis: enoxaparin (LOVENOX) injection 40 mg Start: 12/26/21 1200    Family Communication: None at bedside   Disposition Plan: Status is: Observation The patient will require care spanning > 2 midnights and should be moved to inpatient because: Needs memory care unit-no safe discharge disposition   Planned Discharge Destination: Memory care/SNF-awaiting bed.  Remains medically stable for transfer.  Diet: Diet Order             Diet regular Room service appropriate? Yes; Fluid consistency: Thin  Diet effective now                     Antimicrobial agents: Anti-infectives (From admission, onward)    None        MEDICATIONS: Scheduled Meds:  cyanocobalamin  1,000 mcg Intramuscular Daily   enoxaparin (LOVENOX) injection  40 mg Subcutaneous Q24H   feeding supplement (KATE FARMS STANDARD 1.4)  325 mL Oral BID BM   haloperidol  1 mg Oral Daily  And   haloperidol  2 mg Oral QHS   hydrocerin   Topical BID   nicotine  14 mg Transdermal Daily   propranolol  10 mg Oral BID   Continuous Infusions: PRN Meds:.acetaminophen, alum & mag hydroxide-simeth, atropine, docusate sodium, fluticasone, hydrOXYzine, LORazepam, magnesium hydroxide, OLANZapine zydis **AND** ziprasidone,  ondansetron, polyethylene glycol   I have personally reviewed following labs and imaging studies  LABORATORY DATA:  Recent Labs  Lab 12/24/21 1314 12/25/21 0516  WBC 5.5 5.3  HGB 14.9 14.1  HCT 45.5 41.6  PLT 279 266  MCV 92.5 88.9  MCH 30.3 30.1  MCHC 32.7 33.9  RDW 13.1 13.2  LYMPHSABS 1.6  --   MONOABS 0.2  --   EOSABS 0.1  --   BASOSABS 0.0  --     Recent Labs  Lab 12/24/21 1314 12/24/21 1420 12/24/21 1507 12/25/21 0516 12/25/21 1130 12/25/21 1640 12/26/21 1748  NA  --   --  142 142  --   --   --   K  --   --  4.7 3.9  --   --   --   CL  --   --  105 106  --   --   --   CO2  --   --  24 25  --   --   --   GLUCOSE  --   --  99 108*  --   --   --   BUN  --   --  12 9  --   --   --   CREATININE  --   --  1.13* 1.05*  --   --   --   CALCIUM  --   --  9.7 9.3  --   --   --   AST  --   --  28  --   --   --   --   ALT  --   --  16  --   --   --   --   ALKPHOS  --   --  55  --   --   --   --   BILITOT  --   --  0.6  --   --   --   --   ALBUMIN  --   --  3.7  --   --   --   --   DDIMER  --   --   --   --   --  1.55*  --   PROCALCITON  --   --   --  <0.10  --   --   --   LATICACIDVEN 1.3  --   --   --   --   --   --   TSH  --   --   --   --  1.371  --  1.093  AMMONIA  --  39*  --   --   --   --   --    RADIOLOGY STUDIES/RESULTS: No results found.   LOS: 3 days   Signature  Susa Raring M.D on 12/29/2021 at 11:13 AM   -  To page go to [email or web address]

## 2021-12-30 ENCOUNTER — Inpatient Hospital Stay (HOSPITAL_COMMUNITY): Payer: MEDICAID

## 2021-12-30 DIAGNOSIS — R7989 Other specified abnormal findings of blood chemistry: Secondary | ICD-10-CM

## 2021-12-30 LAB — COMPREHENSIVE METABOLIC PANEL
ALT: 18 U/L (ref 0–44)
AST: 21 U/L (ref 15–41)
Albumin: 3 g/dL — ABNORMAL LOW (ref 3.5–5.0)
Alkaline Phosphatase: 51 U/L (ref 38–126)
Anion gap: 8 (ref 5–15)
BUN: 12 mg/dL (ref 6–20)
CO2: 25 mmol/L (ref 22–32)
Calcium: 8.7 mg/dL — ABNORMAL LOW (ref 8.9–10.3)
Chloride: 107 mmol/L (ref 98–111)
Creatinine, Ser: 1.21 mg/dL — ABNORMAL HIGH (ref 0.44–1.00)
GFR, Estimated: 53 mL/min — ABNORMAL LOW (ref >60)
Glucose, Bld: 100 mg/dL — ABNORMAL HIGH (ref 70–99)
Potassium: 3.7 mmol/L (ref 3.5–5.1)
Sodium: 140 mmol/L (ref 135–145)
Total Bilirubin: 0.2 mg/dL — ABNORMAL LOW (ref 0.3–1.2)
Total Protein: 6.2 g/dL — ABNORMAL LOW (ref 6.5–8.1)

## 2021-12-30 LAB — CBC
HCT: 39.2 % (ref 36.0–46.0)
Hemoglobin: 12.7 g/dL (ref 12.0–15.0)
MCH: 29.7 pg (ref 26.0–34.0)
MCHC: 32.4 g/dL (ref 30.0–36.0)
MCV: 91.6 fL (ref 80.0–100.0)
Platelets: 262 10*3/uL (ref 150–400)
RBC: 4.28 MIL/uL (ref 3.87–5.11)
RDW: 13.4 % (ref 11.5–15.5)
WBC: 5.6 10*3/uL (ref 4.0–10.5)
nRBC: 0 % (ref 0.0–0.2)

## 2021-12-30 LAB — MAGNESIUM: Magnesium: 2 mg/dL (ref 1.7–2.4)

## 2021-12-30 MED ORDER — ORAL CARE MOUTH RINSE: 15.0000 mL | OROMUCOSAL | Status: DC | PRN

## 2021-12-30 NOTE — Plan of Care (Signed)

## 2021-12-30 NOTE — Progress Notes (Signed)
PROGRESS NOTE        PATIENT DETAILS Name: Natalie Brown Age: 56 y.o. Sex: female Date of Birth: 23-Sep-1965 Admit Date: 12/24/2021 Admitting Physician Dewayne Shorter Levora Dredge, MD [id number], [name], NP  Brief Summary: Patient is a 56 y.o.  female with history of bipolar disorder-who was hospitalized at [facility] received a Ativan challenge for possible catatonia-with minimal improvement-due to persistent altered mental status/tachycardia-patient was admitted to [facility] service for further evaluation and treatment.  Significant events: 9/4>> transferred to [facility]-from [facility]-Altered-no improvement after Ativan challenge.  Significant studies: 9/4>> vitamin B12: 290 9/4>> CXR: No obvious pneumonia 9/4>> MRI brain: No acute intracranial abnormality.  Advanced cerebral/cerebellar atrophy 9/4-9/5>> LTM EEG: No seizures. 9/5>> TSH: 1.3 9/5-9/6>> LTM EEG: Negative. 9/6>> CTA chest: No PE-no PNA.  Significant microbiology data: None  Procedures: None   Consults: Neurology, psychiatry  Subjective:  Patient in bed, appears comfortable, denies any headache, no fever, no chest pain or pressure, no shortness of breath , no abdominal pain. No new focal weakness.   Objective: Vitals: Blood pressure 120/74, pulse (!) 121, temperature 98.4 F (36.9 C), temperature source Axillary, resp. rate 20, SpO2 97 %.   Exam:  Awake, mildly confused, no focal deficits, in no distress [place].AT,PERRAL Supple Neck, No JVD,   Symmetrical Chest wall movement, Good air movement bilaterally, CTAB RRR,No Gallops, Rubs or new Murmurs,  +ve B.Sounds, Abd Soft, No tenderness,   No Cyanosis, Clubbing or edema    Assessment/Plan:  Acute encephalopathy: Likely delirium in the setting of progressive but undiagnosed dementia (MRI brain showed significant atrophy).  EEG negative for seizures.  Has significant underlying psych issues that could be contributing as well.  No further recommendations  from neurology-unclear baseline but she seems better today.    Elevated D-dimer few days ago.  Check lower extremity venous duplex.  Clinically no signs of PE.    Dementia: Significant atrophy on neuroimaging-chronic finding-neurology suspects patient has progressive dementia.  TSH/RPR stable-B12 being supplemented.  Vitamin B12 deficiency: Continue supplementation.  Bipolar disorder: Appreciate psych input-continue Haldol, propanolol .  Tobacco abuse: Continue transdermal nicotine  BMI: Estimated body mass index is 25.62 kg/m as calculated from the following:   Height as of an earlier encounter on 12/24/21: 5\' 6"  (1.676 m).   Weight as of an earlier encounter on 12/24/21: 72 kg.   Code status:   Code Status: Prior   DVT Prophylaxis: enoxaparin (LOVENOX) injection 40 mg Start: 12/26/21 1200    Family Communication: None at bedside   Disposition Plan: Status is: Observation The patient will require care spanning > 2 midnights and should be moved to inpatient because: Needs memory care unit-no safe discharge disposition   Planned Discharge Destination: Memory care/SNF-awaiting bed.  Remains medically stable for transfer.  Diet: Diet Order             Diet regular Room service appropriate? Yes; Fluid consistency: Thin  Diet effective now                    MEDICATIONS: Scheduled Meds:  cyanocobalamin  1,000 mcg Intramuscular Daily   enoxaparin (LOVENOX) injection  40 mg Subcutaneous Q24H   feeding supplement (KATE FARMS STANDARD 1.4)  325 mL Oral BID BM   haloperidol  1 mg Oral Daily   And   haloperidol  2 mg Oral QHS  hydrocerin   Topical BID   nicotine  14 mg Transdermal Daily   propranolol  10 mg Oral BID   Continuous Infusions: PRN Meds:.acetaminophen, alum & mag hydroxide-simeth, atropine, docusate sodium, fluticasone, hydrOXYzine, LORazepam, magnesium hydroxide, OLANZapine zydis **AND** ziprasidone, ondansetron, polyethylene glycol   I have personally  reviewed following labs and imaging studies  LABORATORY DATA:  Recent Labs  Lab 12/24/21 1314 12/25/21 0516 12/30/21 0235  WBC 5.5 5.3 5.6  HGB 14.9 14.1 12.7  HCT 45.5 41.6 39.2  PLT 279 266 262  MCV 92.5 88.9 91.6  MCH 30.3 30.1 29.7  MCHC 32.7 33.9 32.4  RDW 13.1 13.2 13.4  LYMPHSABS 1.6  --   --   MONOABS 0.2  --   --   EOSABS 0.1  --   --   BASOSABS 0.0  --   --     Recent Labs  Lab 12/24/21 1314 12/24/21 1420 12/24/21 1507 12/25/21 0516 12/25/21 1130 12/25/21 1640 12/26/21 1748 12/29/21 1122 12/30/21 0235  NA  --   --  142 142  --   --   --   --  140  K  --   --  4.7 3.9  --   --   --   --  3.7  CL  --   --  105 106  --   --   --   --  107  CO2  --   --  24 25  --   --   --   --  25  GLUCOSE  --   --  99 108*  --   --   --   --  100*  BUN  --   --  12 9  --   --   --   --  12  CREATININE  --   --  1.13* 1.05*  --   --   --   --  1.21*  CALCIUM  --   --  9.7 9.3  --   --   --   --  8.7*  AST  --   --  28  --   --   --   --   --  21  ALT  --   --  16  --   --   --   --   --  18  ALKPHOS  --   --  55  --   --   --   --   --  51  BILITOT  --   --  0.6  --   --   --   --   --  0.2*  ALBUMIN  --   --  3.7  --   --   --   --   --  3.0*  MG  --   --   --   --   --   --   --   --  2.0  DDIMER  --   --   --   --   --  1.55*  --  1.21*  --   PROCALCITON  --   --   --  <0.10  --   --   --   --   --   LATICACIDVEN 1.3  --   --   --   --   --   --   --   --   TSH  --   --   --   --  1.371  --  1.093  --   --   AMMONIA  --  39*  --   --   --   --   --   --   --    RADIOLOGY STUDIES/RESULTS: No results found.   LOS: 4 days   Signature  Susa Raring M.D on 12/30/2021 at 10:04 AM   -  To page go to [email or web address]

## 2021-12-30 NOTE — Consult Note (Signed)
[facility] Psychiatry New Face-to-Face Psychiatric Evaluation   Name: Natalie Brown DOB: 1965-10-02 MRN: [id number] Service Date: December 30, 2021 LOS:  LOS: 4 days  Reason for Consult: " Med management" Referring Provider: Bing Neighbors, MD   Assessment  Natalie Brown is a 56 y.o. female admitted medically for 12/24/2021 11:34 PM for possible catatonia/altered mental status. She carries the psychiatric diagnoses of bipolar affective disorder with psychotic symptoms and has a past medical history of hypertension.   Her current presentation of altered mental status is most consistent with dementia, per neurology, given her subpar response to Ativan challenge less likely catatonia, EEG showed no seizures, and imaging shows chronic atrophy, c/w dementia. She has no overt psychosis on my assessment and does not appear internally preoccupied. She DOES NOT meet criteria for inpatient psychiatry; rather, she is more appropriate for a memory care unit.  Current inpatient BH psychotropic medications include Haldol 1 mg every morning and 2 mg nightly, propranolol 10 mg twice daily and historically she has had a good response to these medications. She was compliant with medications prior to admission as evidenced by [facility].   Please see plan below for detailed recommendations.   Diagnoses:  Active Hospital problems: Principal Problem:   Acute encephalopathy Active Problems:   Bipolar affective disorder, current episode manic with psychotic symptoms (HCC)   Tobacco abuse   Altered mental state     Plan  ## Safety and Observation Level:  - Based on my clinical evaluation, I estimate the patient to be at moderate risk of self harm in the current setting of safety unawareness - At this time, we recommend a 1:1 level of observation. This decision is based on my review of the chart including patient's history and current presentation, interview of the patient, mental status examination,  and consideration of suicide risk including evaluating suicidal ideation, plan, intent, suicidal or self-harm behaviors, risk factors, and protective factors. This judgment is based on our ability to directly address suicide risk, implement suicide prevention strategies and develop a safety plan while the patient is in the clinical setting. Please contact our team if there is a concern that risk level has changed.   ## Medications:  --CONTINUE Haldol 1 mg every morning and 2 mg nightly; per [facility], plan to taper as tolerated - CONTINUE propranolol 10 mg twice daily for anxiety and tachycardia - CONTINUE atropine sublingual drops every 8 hours as needed drooling  ## Medical Decision Making Capacity:  Patient has interim legal guardian, mother [name] [phone])  ## Further Work-up:  -- most recent EKG on 9/5 had QtC of 426 -- Pertinent labwork reviewed earlier this admission includes:  D-dimer 1.55 TSH 1.371 CK 263-> 256 CBC WNL BMP largely WNL, only mildly elevated creatinine Ammonia 39 Lactic acid 1.3  ## Disposition:  --Recommend memory care unit; not recommended to return to [facility] upon discharge  ## Behavioral / Environmental:  -- 1:1, delirium precautions DELIRIUM RECS 1: Avoid benzodiazepines, antihistamines, anticholinergics, and minimize opiate use as these may worsen delirium. 2:Assess, prevent and manage pain as lack of treatment can result in delirium.  3: Recommend consult to PT/OT if not already done. Early mobility and exercise has been shown to decrease duration of delirium.  4:Provide appropriate lighting and clear signage; a clock and calendar should be easily visible to the patient. 5:Monitor environmental factors. Reduce light and noise at night (close shades, turn off lights, turn off TV, ect). Correct any alterations in sleep cycle. 6:  Reorient the patient to person, place, time and situation on each encounter.  7: Correct sensory deficits if possible (replace eye  glasses, hearing aids, ect). 8: Avoid restraints. Severely delirious patients benefit from constant observation by a sitter. 9: Do not leave patient unattended.     ##Legal Status Patient with legal guardian  Thank you for this consult request. Recommendations have been communicated to the primary team.  We will continue to follow at this time.   Cristy Hilts, MD  Total Time Spent in Direct Patient Care:  I personally spent 35 minutes on the unit in direct patient care. The direct patient care time included face-to-face time with the patient, reviewing the patient's chart, communicating with other professionals, and coordinating care. Greater than 50% of this time was spent in counseling or coordinating care with the patient regarding goals of hospitalization, psycho-education, and discharge planning needs.   Phineas Inches, MD Psychiatrist   NEW history  Relevant Aspects of Hospital Course:  Admitted on 12/24/2021 for acute encephalopathy.  Patient Report:  Patient is seen at bedside and is cooperative with assessment. [name] alert and oriented to person, place, month, and year. She denies acute concerns or complaints today. She questions when she will be able to leave the hospital, and she is advised that we are looking into placement options for her. She voices understanding, then repeats the question. She otherwise reports that mood is okay and denies depressive symptoms. Reports sleep is okay. Concentration and attention are poor. Denies SI, HI, AH, VH, paranoia.   Previously obtained collateral: Called mom and spoke with her and patient's sister. No one is able to stay with sister beyond 3 days or with mom beyond 2 weeks, and they are the only family members that patient has. They are informed of neurology's assessment that patient did not have seizures and her presentation is more consistent with dementia. Because of this, patient is more so suitable for a memory care unit  rather than returning to [facility], as her manic symptoms were treated, and they are in agreement.     Psychiatric History:  Information collected from chart. Bipolar affective disorder with psychotic features, MNCD Admitted to [facility] since Sep 06, 2021  Family psych history: Patient reports mental illness runs in her father's side of the family.   Social History:   Tobacco use: Half PPD; no use since admission Alcohol use: No use since admission Drug use: Denies; UDS negative on admission in May  Family History:  The patient's family history is not on file.  Medical History: Past Medical History:  Diagnosis Date   Bipolar 1 disorder with moderate mania (HCC) 12/30/2013   Hypertension     Surgical History: No past surgical history on file.  Medications:   Current Facility-Administered Medications:    acetaminophen (TYLENOL) tablet 650 mg, 650 mg, Oral, Q6H PRN, Orland Mustard, MD, 650 mg at 12/29/21 2057   alum & mag hydroxide-simeth (MAALOX/MYLANTA) 200-200-20 MG/5ML suspension 30 mL, 30 mL, Oral, Q4H PRN, Orland Mustard, MD   atropine 1 % ophthalmic solution 1 drop, 1 drop, Sublingual, Q8H PRN, Orland Mustard, MD   cyanocobalamin (VITAMIN B12) injection 1,000 mcg, 1,000 mcg, Intramuscular, Daily, Ghimire, Werner Lean, MD, 1,000 mcg at 12/30/21 8676   docusate sodium (COLACE) capsule 100 mg, 100 mg, Oral, Daily PRN, Orland Mustard, MD   enoxaparin (LOVENOX) injection 40 mg, 40 mg, Subcutaneous, Q24H, Ghimire, Werner Lean, MD, 40 mg at 12/30/21 1246   feeding supplement (KATE FARMS  STANDARD 1.4) liquid 325 mL, 325 mL, Oral, BID BM, Orland Mustard, MD, 325 mL at 12/30/21 1246   fluticasone (FLONASE) 50 MCG/ACT nasal spray 1 spray, 1 spray, Each Nare, Daily PRN, Orland Mustard, MD   haloperidol (HALDOL) tablet 1 mg, 1 mg, Oral, Daily, 1 mg at 12/30/21 0917 **AND** haloperidol (HALDOL) tablet 2 mg, 2 mg, Oral, QHS, Cosby, Toni Amend, MD, 2 mg at 12/29/21 2015   hydrocerin (EUCERIN) cream, ,  Topical, BID, Orland Mustard, MD, Given at 12/30/21 0919   hydrOXYzine (ATARAX) tablet 25 mg, 25 mg, Oral, TID PRN, Orland Mustard, MD, 25 mg at 12/30/21 1829   LORazepam (ATIVAN) tablet 0.5 mg, 0.5 mg, Oral, Q6H PRN, Orland Mustard, MD, 0.5 mg at 12/30/21 9371   magnesium hydroxide (MILK OF MAGNESIA) suspension 30 mL, 30 mL, Oral, Daily PRN, Orland Mustard, MD   nicotine (NICODERM CQ - dosed in mg/24 hr) patch 14 mg, 14 mg, Transdermal, Daily, Orland Mustard, MD, 14 mg at 12/30/21 0922   OLANZapine zydis (ZYPREXA) disintegrating tablet 5 mg, 5 mg, Oral, Q8H PRN **AND** ziprasidone (GEODON) injection 20 mg, 20 mg, Intramuscular, PRN, Orland Mustard, MD   ondansetron [facility]) tablet 4 mg, 4 mg, Oral, Q8H PRN, Orland Mustard, MD   Oral care mouth rinse, 15 mL, Mouth Rinse, PRN, Leroy Sea, MD   polyethylene glycol (MIRALAX / GLYCOLAX) packet 17 g, 17 g, Oral, Daily PRN, Orland Mustard, MD   propranolol (INDERAL) tablet 10 mg, 10 mg, Oral, BID, Orland Mustard, MD, 10 mg at 12/30/21 6967  Allergies: Allergies  Allergen Reactions   Penicillins Itching       Objective  Vital signs:  Temp:  [97.6 F (36.4 C)-99.5 F (37.5 C)] 98.6 F (37 C) (09/10 1154) Pulse Rate:  [79-122] 103 (09/10 1154) Resp:  [16-20] 16 (09/10 1154) BP: (102-144)/(71-87) 107/71 (09/10 1154) SpO2:  [95 %-99 %] 97 % (09/10 1154)  Psychiatric Specialty Exam:  Presentation  General Appearance: Appropriate for Environment; Fairly Groomed  Eye Contact:Good  Speech:Clear and Coherent  Speech Volume:Normal  Handedness:Right   Mood and Affect  Mood:euthymic  Affect:Congruent   Thought Process  Thought Processes:tangential  Descriptions of Associations:Intact  Orientation:Partial  Thought Content:Logical  History of Schizophrenia/Schizoaffective disorder:No  Duration of Psychotic Symptoms:N/A  Hallucinations:No data recorded denies AH, VH, TH  Ideas of Reference:None  Suicidal  Thoughts:No data recorded Denies  Homicidal Thoughts:No data recorded Denies  Sensorium  Memory:Remote Poor; Recent Poor; Immediate Fair  Judgment:Poor  Insight:Poor   Executive Functions  Concentration:poor  Attention Span:poor  Recall:Poor  Fund of Knowledge:Poor  Language:Fair   Psychomotor Activity  Psychomotor Activity:No data recorded  Assets  Assets:Resilience   Sleep  Sleep:No data recorded   Physical Exam: Physical Exam Vitals reviewed.  Constitutional:      General: [name] not in acute distress.    Appearance: [name] not toxic-appearing.  HENT:     Head: Normocephalic and atraumatic.     Mouth/Throat:     Mouth: Mucous membranes are moist.     Pharynx: Oropharynx is clear.  Pulmonary:     Effort: Pulmonary effort is normal.  Skin:    General: Skin is warm and dry.  Neurological:     General: No focal deficit present.     Mental Status: [name] alert and oriented to person, place, and time.     Motor: No weakness.     Gait: Gait normal.     Blood pressure 107/71, pulse (!) 103, temperature 98.6  F (37 C), temperature source Oral, resp. rate 16, SpO2 97 %. There is no height or weight on file to calculate BMI.  Review of Systems  Psychiatric/Behavioral:  Negative for depression, hallucinations and suicidal ideas. The patient is not nervous/anxious.

## 2021-12-30 NOTE — Progress Notes (Signed)
   12/30/21 0900  Assess: MEWS Score  Temp 98.4 F (36.9 C)  BP 120/74  MAP (mmHg) 87  Pulse Rate (!) 121  Resp 20  Assess: MEWS Score  MEWS Temp 0  MEWS Systolic 0  MEWS Pulse 2  MEWS RR 0  MEWS LOC 0  MEWS Score 2  MEWS Score Color Yellow  Assess: if the MEWS score is Yellow or Red  Were vital signs taken at a resting state? Yes  Focused Assessment No change from prior assessment  Does the patient meet 2 or more of the SIRS criteria? Yes  Does the patient have a confirmed or suspected source of infection? No  MEWS guidelines implemented *See Row Information* Yes  Take Vital Signs  Increase Vital Sign Frequency  Yellow: Q 2hr X 2 then Q 4hr X 2, if remains yellow, continue Q 4hrs  Escalate  MEWS: Escalate Yellow: discuss with charge nurse/RN and consider discussing with provider and RRT  Notify: Charge Nurse/RN  Name of Charge Nurse/RN Notified DesiRay  Date Charge Nurse/RN Notified 12/30/21  Time Charge Nurse/RN Notified 0915  Document  Progress note created (see row info) Yes  Assess: SIRS CRITERIA  SIRS Temperature  0  SIRS Pulse 1  SIRS Respirations  0  SIRS WBC 1  SIRS Score Sum  2

## 2021-12-30 NOTE — Progress Notes (Signed)
Bilateral lower extremity venous duplex completed. Refer to "CV Proc" under chart review to view preliminary results.  12/30/2021 11:21 AM Eula Fried., MHA, RVT, RDCS, RDMS

## 2021-12-31 DIAGNOSIS — R4182 Altered mental status, unspecified: Secondary | ICD-10-CM

## 2021-12-31 MED ORDER — LACTATED RINGERS IV BOLUS
500.0000 mL | Freq: Once | INTRAVENOUS | Status: AC
  Administered 2021-12-31: 500 mL via INTRAVENOUS

## 2021-12-31 NOTE — Consult Note (Signed)
Brief Psychiatry Consult Note  Date of service: December 31, 2021 Patient Name: Natalie Brown DOB: 1965-09-17 MRN: [id number] Reason for Consult: " Med management" Referring Provider: Bing Neighbors, MD  Natalie Brown is a 56 y.o. female admitted medically for 12/24/2021 11:34 PM for possible catatonia/altered mental status. She carries the psychiatric diagnoses of bipolar affective disorder with psychotic symptoms and has a past medical history of hypertension.  The patient was last seen by the psychiatry service on 9/10. Interim documentation by primary team and nursing staff has been reviewed. At this time, current presentation of altered mental status is most consistent with dementia, per neurology, given her subpar response to Ativan challenge less likely catatonia, EEG showed no seizures, and imaging shows chronic atrophy, c/w dementia.   Family, including mom as legal guardian, is adamant about the fact that they do not want psychiatry follow-up moving forward.  Psychiatric Specialty Exam: Physical Exam Vitals reviewed.  Constitutional:      General: [name] not in acute distress. HENT:     Head: Normocephalic and atraumatic.  Pulmonary:     Effort: Pulmonary effort is normal.  Skin:    General: Skin is warm and dry.  Neurological:     Mental Status: [name] alert. [name] disoriented.     Motor: No weakness.       Blood pressure 114/74, pulse 89, temperature 98 F (36.7 C), temperature source Oral, resp. rate 18, SpO2 95 %.There is no height or weight on file to calculate BMI.  General Appearance:  In hospital gown, eating, with drool running down her chin  Eye Contact:  Fair  Speech:  Slow  Volume:  Normal  Mood:   "I'm okay"  Affect:  Blunt  Thought Process:  NA and Descriptions of Associations: Intact, answers questions with minimally worded answers. Asks questions repeatedly  Orientation:  Other:  Partial: person, place, and month (not year)  Thought Content:    Limited spontaneous conversation, except to ask when she is returning to [facility] despite being told numerous times that she will not be returning there.  Suicidal Thoughts:  Not asked, as mother preferred to discuss with primary team  Homicidal Thoughts:  Not asked, as mother preferred to discuss with primary team  Memory:  Immediate;   Poor Recent;   Poor Remote;   Poor  Judgement:  Impaired  Insight:   None  Psychomotor Activity:  Psychomotor Retardation  Concentration:  Concentration: Poor and Attention Span: Fair  Recall:  Poor  Fund of Knowledge:  Poor  Language:  Poor  Akathisia:  No  Handed:  Right  AIMS (if indicated):     Assets:  Social Support  ADL's:  Impaired  Cognition:  Impaired,  Moderate  Sleep:   Good     Patient is without evidence of acute psychiatric disturbance, including mania or psychosis, requiring ongoing psychiatric consultation. Please see last consult note for full assessment. Final medication recommendations are as follows:  - --CONTINUE Haldol 1 mg every morning and 2 mg nightly; per [facility], plan to taper as tolerated - CONTINUE propranolol 10 mg twice daily for anxiety and tachycardia (currently held for hypotension but resume as tolerated) - CONTINUE atropine sublingual drops every 8 hours as needed drooling  As no medication changes have been made since patient has been admitted to the hospital, and patient no longer requires inpatient psychiatric admission, we will sign off at this time. This has been communicated to the primary team. If issues  arise in the future, don't hesitate to reconsult the Psychiatry Inpatient Consult Service.   Signed: [name], MD Psychiatry Resident, PGY-2 [facility] 12/31/2021, 9:40 AM

## 2021-12-31 NOTE — Progress Notes (Addendum)
PT Cancellation Note  Patient Details Name: Natalie Brown MRN: [id number] DOB: 04/11/1966   Cancelled Treatment:    Reason Eval/Treat Not Completed: (P) Other (comment) (pt working with OT, PTA did not have time to reattempt.) Will continue efforts per PT plan of care as schedule permits.   [name] 12/31/2021, 6:47 PM* delayed entry

## 2021-12-31 NOTE — Progress Notes (Signed)
PROGRESS NOTE        PATIENT DETAILS Name: Natalie Brown Age: 56 y.o. Sex: female Date of Birth: [birth year] Admit Date: 12/24/2021 Admitting Physician Dewayne Shorter Levora Dredge, MD CNO:[id number], [name], NP  Brief Summary: Patient is a 56 y.o.  female with history of bipolar disorder-who was hospitalized at [facility] received a Ativan challenge for possible catatonia-with minimal improvement-due to persistent altered mental status/tachycardia-patient was admitted to [facility] service for further evaluation and treatment.  Significant events: 9/4>> transferred to [facility]-from [facility]-Altered-no improvement after Ativan challenge.  Significant studies: 9/4>> vitamin B12: 290 9/4>> CXR: No obvious pneumonia 9/4>> MRI brain: No acute intracranial abnormality.  Advanced cerebral/cerebellar atrophy 9/4-9/5>> LTM EEG: No seizures. 9/5>> TSH: 1.3 9/5-9/6>> LTM EEG: Negative. 9/6>> CTA chest: No PE-no PNA.  Significant microbiology data: None  Procedures: None   Consults: Neurology, psychiatry  Subjective:  Patient in bed, appears comfortable, denies any headache, no fever, no chest pain or pressure, no shortness of breath , no abdominal pain. No new focal weakness.   Objective: Vitals: Blood pressure 100/75, pulse 73, temperature 97.7 F (36.5 C), temperature source Axillary, resp. rate 18, SpO2 95 %.   Exam:  Awake, mildly confused, no focal deficits, in no distress [place].AT,PERRAL Supple Neck, No JVD,   Symmetrical Chest wall movement, Good air movement bilaterally, CTAB RRR,No Gallops, Rubs or new Murmurs,  +ve B.Sounds, Abd Soft, No tenderness,   No Cyanosis, Clubbing or edema     Assessment/Plan:  Acute encephalopathy: Likely delirium in the setting of progressive but undiagnosed dementia (MRI brain showed significant atrophy).  EEG negative for seizures.  Has significant underlying psych issues that could be contributing as well.  No further recommendations from  neurology-unclear baseline but she seems better today.    Elevated D-dimer few days ago, and has improved negative lower extremity venous duplex.  No signs of PE.  Dementia: Significant atrophy on neuroimaging-chronic finding-neurology suspects patient has progressive dementia.  TSH/RPR stable-B12 being supplemented.  Await SNF bed cleared by psych for placement.  Vitamin B12 deficiency: Continue supplementation.  Bipolar disorder: Appreciate psych input-continue Haldol, propanolol .  Per psych requires SNF/memory care.  Tobacco abuse: Continue transdermal nicotine  BMI: Estimated body mass index is 25.62 kg/m as calculated from the following:   Height as of an earlier encounter on 12/24/21: 5\' 6"  (1.676 m).   Weight as of an earlier encounter on 12/24/21: 72 kg.   Code status:   Code Status: Prior   DVT Prophylaxis: enoxaparin (LOVENOX) injection 40 mg Start: 12/26/21 1200    Family Communication: None at bedside   Disposition Plan: Status is: Inpt The patient will require care spanning > 2 midnights and should be moved to inpatient because: Needs memory care unit-no safe discharge disposition   Planned Discharge Destination: Memory care/SNF-awaiting bed.  Remains medically stable for transfer.  Diet: Diet Order             Diet regular Room service appropriate? Yes; Fluid consistency: Thin  Diet effective now                    MEDICATIONS: Scheduled Meds:  cyanocobalamin  1,000 mcg Intramuscular Daily   enoxaparin (LOVENOX) injection  40 mg Subcutaneous Q24H   feeding supplement (KATE FARMS STANDARD 1.4)  325 mL Oral BID BM   haloperidol  1 mg Oral  Daily   And   haloperidol  2 mg Oral QHS   hydrocerin   Topical BID   nicotine  14 mg Transdermal Daily   Continuous Infusions: PRN Meds:.acetaminophen, alum & mag hydroxide-simeth, atropine, docusate sodium, fluticasone, hydrOXYzine, LORazepam, magnesium hydroxide, OLANZapine zydis **AND** ziprasidone,  ondansetron, mouth rinse, polyethylene glycol   I have personally reviewed following labs and imaging studies  LABORATORY DATA:  Recent Labs  Lab 12/24/21 1314 12/25/21 0516 12/30/21 0235  WBC 5.5 5.3 5.6  HGB 14.9 14.1 12.7  HCT 45.5 41.6 39.2  PLT 279 266 262  MCV 92.5 88.9 91.6  MCH 30.3 30.1 29.7  MCHC 32.7 33.9 32.4  RDW 13.1 13.2 13.4  LYMPHSABS 1.6  --   --   MONOABS 0.2  --   --   EOSABS 0.1  --   --   BASOSABS 0.0  --   --     Recent Labs  Lab 12/24/21 1314 12/24/21 1420 12/24/21 1507 12/25/21 0516 12/25/21 1130 12/25/21 1640 12/26/21 1748 12/29/21 1122 12/30/21 0235  NA  --   --  142 142  --   --   --   --  140  K  --   --  4.7 3.9  --   --   --   --  3.7  CL  --   --  105 106  --   --   --   --  107  CO2  --   --  24 25  --   --   --   --  25  GLUCOSE  --   --  99 108*  --   --   --   --  100*  BUN  --   --  12 9  --   --   --   --  12  CREATININE  --   --  1.13* 1.05*  --   --   --   --  1.21*  CALCIUM  --   --  9.7 9.3  --   --   --   --  8.7*  AST  --   --  28  --   --   --   --   --  21  ALT  --   --  16  --   --   --   --   --  18  ALKPHOS  --   --  55  --   --   --   --   --  51  BILITOT  --   --  0.6  --   --   --   --   --  0.2*  ALBUMIN  --   --  3.7  --   --   --   --   --  3.0*  MG  --   --   --   --   --   --   --   --  2.0  DDIMER  --   --   --   --   --  1.55*  --  1.21*  --   PROCALCITON  --   --   --  <0.10  --   --   --   --   --   LATICACIDVEN 1.3  --   --   --   --   --   --   --   --   TSH  --   --   --   --  1.371  --  1.093  --   --   AMMONIA  --  39*  --   --   --   --   --   --   --    RADIOLOGY STUDIES/RESULTS: VAS [place] LOWER EXTREMITY VENOUS (DVT)  Result Date: 12/30/2021  Lower Venous DVT Study Patient Name:  Natalie Brown  Date of Exam:   12/30/2021 Medical Rec #: [id number]         Accession #:    [id number] Date of Birth: 1965-05-07         Patient Gender: F Patient Age:   56 years Exam Location:  [facility]  Procedure:      VAS [place] LOWER EXTREMITY VENOUS (DVT) Referring Phys: Bess Harvest [facility] --------------------------------------------------------------------------------  Indications: Elevated d-dimer.  Comparison Study: No prior study Performing Technologist: Gertie Fey MHA, RDMS, RVT, RDCS  Examination Guidelines: A complete evaluation includes B-mode imaging, spectral Doppler, color Doppler, and power Doppler as needed of all accessible portions of each vessel. Bilateral testing is considered an integral part of a complete examination. Limited examinations for reoccurring indications may be performed as noted. The reflux portion of the exam is performed with the patient in reverse Trendelenburg.  +---------+---------------+---------+-----------+----------+--------------+ RIGHT    CompressibilityPhasicitySpontaneityPropertiesThrombus Aging +---------+---------------+---------+-----------+----------+--------------+ CFV      Full           Yes      Yes                                 +---------+---------------+---------+-----------+----------+--------------+ SFJ      Full                                                        +---------+---------------+---------+-----------+----------+--------------+ FV Prox  Full                                                        +---------+---------------+---------+-----------+----------+--------------+ FV Mid   Full                                                        +---------+---------------+---------+-----------+----------+--------------+ FV DistalFull                                                        +---------+---------------+---------+-----------+----------+--------------+ PFV      Full                                                        +---------+---------------+---------+-----------+----------+--------------+ POP      Full           Yes  Yes                                  +---------+---------------+---------+-----------+----------+--------------+ PTV      Full                                                        +---------+---------------+---------+-----------+----------+--------------+ PERO     Full                                                        +---------+---------------+---------+-----------+----------+--------------+   +---------+---------------+---------+-----------+----------+--------------+ LEFT     CompressibilityPhasicitySpontaneityPropertiesThrombus Aging +---------+---------------+---------+-----------+----------+--------------+ CFV      Full           Yes      Yes                                 +---------+---------------+---------+-----------+----------+--------------+ SFJ      Full                                                        +---------+---------------+---------+-----------+----------+--------------+ FV Prox  Full                                                        +---------+---------------+---------+-----------+----------+--------------+ FV Mid   Full                                                        +---------+---------------+---------+-----------+----------+--------------+ FV DistalFull                                                        +---------+---------------+---------+-----------+----------+--------------+ PFV      Full                                                        +---------+---------------+---------+-----------+----------+--------------+ POP      Full           Yes      Yes                                 +---------+---------------+---------+-----------+----------+--------------+ PTV      Full                                                        +---------+---------------+---------+-----------+----------+--------------+  PERO     Full                                                         +---------+---------------+---------+-----------+----------+--------------+     Summary: RIGHT: - There is no evidence of deep vein thrombosis in the lower extremity.  - No cystic structure found in the popliteal fossa.  LEFT: - There is no evidence of deep vein thrombosis in the lower extremity.  - No cystic structure found in the popliteal fossa.  *See table(s) above for measurements and observations.    Preliminary      LOS: 5 days   Signature  Susa Raring M.D on 12/31/2021 at 8:47 AM   -  To page go to [email or web address]

## 2021-12-31 NOTE — TOC Progression Note (Addendum)
Transition of Care [facility]) - Progression Note    Patient Details  Name: Natalie Brown MRN: [id number] Date of Birth: [birth year]  [facility] CM/SW Contact  Mearl Latin, LCSW Phone Number: 12/31/2021, 9:31 AM  Clinical Narrative:    CSW continuing to follow. Payer source still pending at this time. Leadership aware.   CSW spoke with patient's mother and provided update.     Barriers to Discharge: Financial Resources, Inadequate or no insurance, Homeless with medical needs  Expected Discharge Plan and Services   In-house Referral: Clinical Social Work   Post Acute Care Choice: Nursing Home Living arrangements for the past 2 months: Homeless [facility]) Expected Discharge Date: 12/24/21                                     Social Determinants of Health (SDOH) Interventions    Readmission Risk Interventions     No data to display

## 2021-12-31 NOTE — Progress Notes (Signed)
Occupational Therapy Treatment Patient Details Name: Natalie Brown MRN: [id number] DOB: [birth year] Today's Date: 12/31/2021   History of present illness Patient is a 56 y.o.  female with history of bipolar disorder-who was hospitalized at [facility] received a Ativan challenge for possible catatonia-with minimal improvement-due to persistent altered mental status/tachycardia-patient was admitted to [facility] service for further evaluation and treatment.   OT comments  Pt progressing towards established OT goals. Performing transfers and functional mobility with min guard A. Focus session on following multistep commands. Pt performing IADL of making bed with mod verbal cues for problem solving and sequencing, as well as significantly increased time. Pt with decreased initiation and observed with consistent anterior loss of drool during session. Pt answering all questions with increased time, but not verbalizing unless asked to. Pt with mod-max difficulty answering simple medication management questions. Continue to recommend OPOT to optimize safety and independence with ADL and IADL.    Recommendations for follow up therapy are one component of a multi-disciplinary discharge planning process, led by the attending physician.  Recommendations may be updated based on patient status, additional functional criteria and insurance authorization.    Follow Up Recommendations  Outpatient OT    Assistance Recommended at Discharge Frequent or constant Supervision/Assistance  Patient can return home with the following  A little help with bathing/dressing/bathroom;Assistance with cooking/housework;Direct supervision/assist for medications management;Direct supervision/assist for financial management;Assist for transportation;A little help with walking and/or transfers;Help with stairs or ramp for entrance   Equipment Recommendations  None recommended by OT    Recommendations for Other Services Speech consult     Precautions / Restrictions Precautions Precautions: Fall Restrictions Weight Bearing Restrictions: No       Mobility Bed Mobility Overal bed mobility: Modified Independent Bed Mobility: Supine to Sit, Sit to Supine     Supine to sit: HOB elevated, Modified independent (Device/Increase time) Sit to supine: Supervision   General bed mobility comments: Modified independent when she initiates the movement. Needed repeated verbal cues when asked to mobilize on command    Transfers Overall transfer level: Needs assistance Equipment used: None Transfers: Sit to/from Stand Sit to Stand: Supervision           General transfer comment: supervision for safety     Balance Overall balance assessment: Mild deficits observed, not formally tested (Several staggering steps during session, but able to self correct. Min guard A for safety)                                         ADL either performed or assessed with clinical judgement   ADL Overall ADL's : Needs assistance/impaired     Grooming: Wash/dry face;Min guard;Standing                   Toilet Transfer: Min guard;Ambulation;Regular Teacher, adult education Details (indicate cue type and reason): min guard A for safety; simulated in room         Functional mobility during ADLs: Min guard General ADL Comments: min A for verbal cues for attention, initiation, sequencing and termination of task. Focus session on functional daily task of making bed. Pt requiring mod cues throughout for problem solving. Min guard A for balance. Pt with decreased safety as indicated by standing with one foot in front of other when reaching    Extremity/Trunk Assessment Upper Extremity Assessment Upper Extremity Assessment: Overall WFL for tasks  assessed (using appropriately during functional tasks)   Lower Extremity Assessment Lower Extremity Assessment: Overall WFL for tasks assessed        Vision   Vision  Assessment?: No apparent visual deficits Additional Comments: seemingly [facility]   Perception     Praxis      Cognition Arousal/Alertness: Awake/alert Behavior During Therapy: Flat affect Overall Cognitive Status: Impaired/Different from baseline Area of Impairment: Attention, Following commands, Safety/judgement, Awareness, Problem solving                   Current Attention Level: Sustained   Following Commands: Follows one step commands inconsistently Safety/Judgement: Decreased awareness of safety, Decreased awareness of deficits Awareness: Intellectual Problem Solving: Requires verbal cues, Decreased initiation General Comments: slow processing, poor attention and easily distracted. Requiring step by step commands to make bed this session. Pt with very slow processig and movement. Decreased safety awareness, leaning onto bed with far reaching, difficulty problem solving        Exercises      Shoulder Instructions       General Comments VSS on RA    Pertinent Vitals/ Pain       Pain Assessment Pain Assessment: No/denies pain Pain Intervention(s): Monitored during session  Home Living                                          Prior Functioning/Environment              Frequency  Min 2X/week        Progress Toward Goals  OT Goals(current goals can now be found in the care plan section)  Progress towards OT goals: Progressing toward goals  Acute Rehab OT Goals Patient Stated Goal: Go back to [facility] OT Goal Formulation: With patient Time For Goal Achievement: 01/09/22 Potential to Achieve Goals: Good ADL Goals Pt Will Perform Grooming: Independently;standing Pt Will Perform Upper Body Dressing: Independently;sitting Pt Will Perform Lower Body Dressing: Independently;sit to/from stand Additional ADL Goal #1: Pt will indep complete IADL medication management task Additional ADL Goal #2: pt will follow 2-3 step commands 50% of the  session to complete functional tasks  Plan Discharge plan remains appropriate;Frequency remains appropriate    Co-evaluation                 AM-PAC OT "6 Clicks" Daily Activity     Outcome Measure   Help from another person eating meals?: A Little Help from another person taking care of personal grooming?: A Little Help from another person toileting, which includes using toliet, bedpan, or urinal?: A Little Help from another person bathing (including washing, rinsing, drying)?: A Little Help from another person to put on and taking off regular upper body clothing?: A Little Help from another person to put on and taking off regular lower body clothing?: A Little 6 Click Score: 18    End of Session    OT Visit Diagnosis: Muscle weakness (generalized) (M62.81);History of falling (Z91.81);Other symptoms and signs involving cognitive function   Activity Tolerance Patient tolerated treatment well   Patient Left in bed;with call bell/phone within reach;with bed alarm set;with family/visitor present   Nurse Communication Mobility status;Other (comment) (Pt reporting she has not eaten at all today)        Time: [id number] OT Time Calculation (min): 24 min  Charges: OT General Charges $OT Visit: 1 Visit OT Treatments $  Self Care/Home Management : 23-37 mins  Ladene Artist, OTR/L [facility] Acute Rehabilitation Office: [phone]   Drue Novel 12/31/2021, 1:08 PM

## 2022-01-01 DIAGNOSIS — G9340 Encephalopathy, unspecified: Secondary | ICD-10-CM

## 2022-01-01 MED ORDER — ATROPINE SULFATE 1 % OP SOLN
1.0000 [drp] | Freq: Three times a day (TID) | OPHTHALMIC | Status: DC
Start: 2022-01-01 — End: 2022-02-19
  Administered 2022-01-01 – 2022-02-19 (×89): 1 [drp] via SUBLINGUAL
  Filled 2022-01-01 (×6): qty 2

## 2022-01-01 NOTE — Plan of Care (Signed)
  Problem: Safety: Goal: Non-violent Restraint(s) Outcome: Progressing   Problem: Clinical Measurements: Goal: Will remain free from infection Outcome: Progressing   Problem: Nutrition: Goal: Adequate nutrition will be maintained Outcome: Progressing   Problem: Coping: Goal: Level of anxiety will decrease Outcome: Progressing   Problem: Pain Managment: Goal: General experience of comfort will improve Outcome: Progressing   Problem: Safety: Goal: Ability to remain free from injury will improve Outcome: Progressing

## 2022-01-01 NOTE — Progress Notes (Signed)
PROGRESS NOTE        PATIENT DETAILS Name: Natalie Brown Age: 56 y.o. Sex: female Date of Birth: 16-Dec-1965 Admit Date: 12/24/2021 Admitting Physician Dewayne Shorter Levora Dredge, MD PNT:IRWERX, [name], NP  Brief Summary: Patient is a 56 y.o.  female with history of bipolar disorder-who was hospitalized at [facility] received a Ativan challenge for possible catatonia-with minimal improvement-due to persistent altered mental status/tachycardia-patient was admitted to [facility] service for further evaluation and treatment.  Significant events: 9/4>> transferred to [facility]-from [facility]-Altered-no improvement after Ativan challenge.  Significant studies: 9/4>> vitamin B12: 290 9/4>> CXR: No obvious pneumonia 9/4>> MRI brain: No acute intracranial abnormality.  Advanced cerebral/cerebellar atrophy 9/4-9/5>> LTM EEG: No seizures. 9/5>> TSH: 1.3 9/5-9/6>> LTM EEG: Negative. 9/6>> CTA chest: No PE-no PNA.  Significant microbiology data: None  Procedures: None   Consults: Neurology, psychiatry  Subjective:  Patient in bed, appears comfortable, denies any headache, no fever, no chest pain or pressure, no shortness of breath , no abdominal pain. No new focal weakness.  Objective: Vitals: Blood pressure 132/80, pulse 98, temperature 98.1 F (36.7 C), temperature source Oral, resp. rate 18, SpO2 97 %.   Exam:  Awake, mildly confused, no focal deficits, in no distress [place].AT,PERRAL Supple Neck, No JVD,   Symmetrical Chest wall movement, Good air movement bilaterally, CTAB RRR,No Gallops, Rubs or new Murmurs,  +ve B.Sounds, Abd Soft, No tenderness,   No Cyanosis, Clubbing or edema    Assessment/Plan:  Acute encephalopathy: Likely delirium in the setting of progressive but undiagnosed dementia (MRI brain showed significant atrophy).  EEG negative for seizures.  Has significant underlying psych issues that could be contributing as well.  No further recommendations from  neurology-unclear baseline but she seems better today.    Elevated D-dimer few days ago, and has improved negative lower extremity venous duplex.  No signs of PE.  Dementia: Significant atrophy on neuroimaging-chronic finding-neurology suspects patient has progressive dementia.  TSH/RPR stable-B12 being supplemented.  Await SNF bed cleared by psych for placement.  Vitamin B12 deficiency: Continue supplementation.  Bipolar disorder: Appreciate psych input-continue Haldol, propanolol .  Per psych requires SNF/memory care.  Tobacco abuse: Continue transdermal nicotine  BMI: Estimated body mass index is 25.62 kg/m as calculated from the following:   Height as of an earlier encounter on 12/24/21: 5\' 6"  (1.676 m).   Weight as of an earlier encounter on 12/24/21: 72 kg.   Code status:   Code Status: Prior   DVT Prophylaxis: enoxaparin (LOVENOX) injection 40 mg Start: 12/26/21 1200    Family Communication: Mother bedside in detail on 12/31/2021.   Disposition Plan: Status is: Inpt The patient will require care spanning > 2 midnights and should be moved to inpatient because: Needs memory care unit-no safe discharge disposition   Planned Discharge Destination: Memory care/SNF-awaiting bed.  Remains medically stable for transfer.  Diet: Diet Order             Diet regular Room service appropriate? No; Fluid consistency: Thin  Diet effective now                    MEDICATIONS: Scheduled Meds:  atropine  1 drop Sublingual TID   enoxaparin (LOVENOX) injection  40 mg Subcutaneous Q24H   feeding supplement (KATE FARMS STANDARD 1.4)  325 mL Oral BID BM   haloperidol  1 mg  Oral Daily   And   haloperidol  2 mg Oral QHS   hydrocerin   Topical BID   nicotine  14 mg Transdermal Daily   Continuous Infusions: PRN Meds:.acetaminophen, alum & mag hydroxide-simeth, docusate sodium, fluticasone, hydrOXYzine, LORazepam, magnesium hydroxide, OLANZapine zydis **AND** ziprasidone, ondansetron,  mouth rinse, polyethylene glycol   I have personally reviewed following labs and imaging studies  LABORATORY DATA:  Recent Labs  Lab 12/30/21 0235  WBC 5.6  HGB 12.7  HCT 39.2  PLT 262  MCV 91.6  MCH 29.7  MCHC 32.4  RDW 13.4    Recent Labs  Lab 12/25/21 1130 12/25/21 1640 12/26/21 1748 12/29/21 1122 12/30/21 0235  NA  --   --   --   --  140  K  --   --   --   --  3.7  CL  --   --   --   --  107  CO2  --   --   --   --  25  GLUCOSE  --   --   --   --  100*  BUN  --   --   --   --  12  CREATININE  --   --   --   --  1.21*  CALCIUM  --   --   --   --  8.7*  AST  --   --   --   --  21  ALT  --   --   --   --  18  ALKPHOS  --   --   --   --  51  BILITOT  --   --   --   --  0.2*  ALBUMIN  --   --   --   --  3.0*  MG  --   --   --   --  2.0  DDIMER  --  1.55*  --  1.21*  --   TSH 1.371  --  1.093  --   --    RADIOLOGY STUDIES/RESULTS: VAS [place] LOWER EXTREMITY VENOUS (DVT)  Result Date: 12/31/2021  Lower Venous DVT Study Patient Name:  ENYLA LISBON  Date of Exam:   12/30/2021 Medical Rec #: [id number]         Accession #:    [id number] Date of Birth: 07/30/1965         Patient Gender: F Patient Age:   32 years Exam Location:  [facility] Procedure:      VAS [place] LOWER EXTREMITY VENOUS (DVT) Referring Phys: Bess Harvest [facility] --------------------------------------------------------------------------------  Indications: Elevated d-dimer.  Comparison Study: No prior study Performing Technologist: Gertie Fey MHA, RDMS, RVT, RDCS  Examination Guidelines: A complete evaluation includes B-mode imaging, spectral Doppler, color Doppler, and power Doppler as needed of all accessible portions of each vessel. Bilateral testing is considered an integral part of a complete examination. Limited examinations for reoccurring indications may be performed as noted. The reflux portion of the exam is performed with the patient in reverse Trendelenburg.   +---------+---------------+---------+-----------+----------+--------------+ RIGHT    CompressibilityPhasicitySpontaneityPropertiesThrombus Aging +---------+---------------+---------+-----------+----------+--------------+ CFV      Full           Yes      Yes                                 +---------+---------------+---------+-----------+----------+--------------+ SFJ      Full                                                        +---------+---------------+---------+-----------+----------+--------------+  FV Prox  Full                                                        +---------+---------------+---------+-----------+----------+--------------+ FV Mid   Full                                                        +---------+---------------+---------+-----------+----------+--------------+ FV DistalFull                                                        +---------+---------------+---------+-----------+----------+--------------+ PFV      Full                                                        +---------+---------------+---------+-----------+----------+--------------+ POP      Full           Yes      Yes                                 +---------+---------------+---------+-----------+----------+--------------+ PTV      Full                                                        +---------+---------------+---------+-----------+----------+--------------+ PERO     Full                                                        +---------+---------------+---------+-----------+----------+--------------+   +---------+---------------+---------+-----------+----------+--------------+ LEFT     CompressibilityPhasicitySpontaneityPropertiesThrombus Aging +---------+---------------+---------+-----------+----------+--------------+ CFV      Full           Yes      Yes                                  +---------+---------------+---------+-----------+----------+--------------+ SFJ      Full                                                        +---------+---------------+---------+-----------+----------+--------------+ FV Prox  Full                                                        +---------+---------------+---------+-----------+----------+--------------+  FV Mid   Full                                                        +---------+---------------+---------+-----------+----------+--------------+ FV DistalFull                                                        +---------+---------------+---------+-----------+----------+--------------+ PFV      Full                                                        +---------+---------------+---------+-----------+----------+--------------+ POP      Full           Yes      Yes                                 +---------+---------------+---------+-----------+----------+--------------+ PTV      Full                                                        +---------+---------------+---------+-----------+----------+--------------+ PERO     Full                                                        +---------+---------------+---------+-----------+----------+--------------+     Summary: RIGHT: - There is no evidence of deep vein thrombosis in the lower extremity.  - No cystic structure found in the popliteal fossa.  LEFT: - There is no evidence of deep vein thrombosis in the lower extremity.  - No cystic structure found in the popliteal fossa.  *See table(s) above for measurements and observations. Electronically signed by Monica Martinez MD on 12/31/2021 at 1:45:21 PM.    Final      LOS: 6 days   Signature  Lala Lund M.D on 01/01/2022 at 10:02 AM   -  To page go to [email or web address]

## 2022-01-02 LAB — CBC
HCT: 38.9 % (ref 36.0–46.0)
Hemoglobin: 13 g/dL (ref 12.0–15.0)
MCH: 30.1 pg (ref 26.0–34.0)
MCHC: 33.4 g/dL (ref 30.0–36.0)
MCV: 90 fL (ref 80.0–100.0)
Platelets: 247 10*3/uL (ref 150–400)
RBC: 4.32 MIL/uL (ref 3.87–5.11)
RDW: 13.4 % (ref 11.5–15.5)
WBC: 5.1 10*3/uL (ref 4.0–10.5)
nRBC: 0 % (ref 0.0–0.2)

## 2022-01-02 LAB — COMPREHENSIVE METABOLIC PANEL
ALT: 21 U/L (ref 0–44)
AST: 23 U/L (ref 15–41)
Albumin: 3.3 g/dL — ABNORMAL LOW (ref 3.5–5.0)
Alkaline Phosphatase: 50 U/L (ref 38–126)
Anion gap: 7 (ref 5–15)
BUN: 11 mg/dL (ref 6–20)
CO2: 25 mmol/L (ref 22–32)
Calcium: 8.9 mg/dL (ref 8.9–10.3)
Chloride: 108 mmol/L (ref 98–111)
Creatinine, Ser: 1.06 mg/dL — ABNORMAL HIGH (ref 0.44–1.00)
GFR, Estimated: >60 mL/min (ref >60)
Glucose, Bld: 93 mg/dL (ref 70–99)
Potassium: 4.3 mmol/L (ref 3.5–5.1)
Sodium: 140 mmol/L (ref 135–145)
Total Bilirubin: 0.5 mg/dL (ref 0.3–1.2)
Total Protein: 6.5 g/dL (ref 6.5–8.1)

## 2022-01-02 LAB — MAGNESIUM: Magnesium: 2.2 mg/dL (ref 1.7–2.4)

## 2022-01-02 MED ORDER — METOPROLOL TARTRATE 5 MG/5ML IV SOLN
5.0000 mg | Freq: Once | INTRAVENOUS | Status: DC
  Filled 2022-01-02: qty 5

## 2022-01-02 MED ORDER — METOPROLOL TARTRATE 5 MG/5ML IV SOLN
5.0000 mg | Freq: Three times a day (TID) | INTRAVENOUS | Status: DC | PRN
  Administered 2022-01-02: 5 mg via INTRAVENOUS
  Filled 2022-01-02: qty 5

## 2022-01-02 MED ORDER — POLYETHYLENE GLYCOL 3350 17 G PO PACK
17.0000 g | PACK | Freq: Two times a day (BID) | ORAL | Status: DC
  Administered 2022-01-02 – 2022-04-12 (×133): 17 g via ORAL
  Filled 2022-01-02 (×149): qty 1

## 2022-01-02 MED ORDER — DOCUSATE SODIUM 100 MG PO CAPS
200.0000 mg | ORAL_CAPSULE | Freq: Two times a day (BID) | ORAL | Status: DC
  Administered 2022-01-02 – 2022-04-12 (×154): 200 mg via ORAL
  Filled 2022-01-02 (×165): qty 2

## 2022-01-02 NOTE — Progress Notes (Signed)
Physical Therapy Treatment Patient Details Name: Natalie Brown MRN: [id number] DOB: July 02, 1965 Today's Date: 01/02/2022   History of Present Illness Patient is a 56 y.o.  female with history of bipolar disorder-who was hospitalized at [facility] received a Ativan challenge for possible catatonia-with minimal improvement-due to persistent altered mental status/tachycardia-patient was admitted to [facility] service for further evaluation and treatment.    PT Comments    Pt is lethargic initially upon PT arrival, requiring max verbal and tactile stimulation, including sternal rub, in an effort to arouse. Pt gradually becomes more alert and responsive throughout session but continues to demonstrate difficulty consistently following commands. Pt with tendency to drift laterally during ambulation, bumping into the wall and losing balance a few times when performing ADLs in the room. Pt will benefit from continued acute PT services in an effort to improve balance and reduce falls risk, however progress is limited by cognitive deficits.   Recommendations for follow up therapy are one component of a multi-disciplinary discharge planning process, led by the attending physician.  Recommendations may be updated based on patient status, additional functional criteria and insurance authorization.  Follow Up Recommendations  No PT follow up     Assistance Recommended at Discharge Intermittent Supervision/Assistance  Patient can return home with the following Direct supervision/assist for medications management;Direct supervision/assist for financial management;Assist for transportation   Equipment Recommendations  None recommended by PT    Recommendations for Other Services       Precautions / Restrictions Precautions Precautions: Fall Precaution Comments: EEG monitoring - it was discounted when therapist entered room. Restrictions Weight Bearing Restrictions: No     Mobility  Bed Mobility Overal bed  mobility: Modified Independent Bed Mobility: Supine to Sit     Supine to sit: Modified independent (Device/Increase time)          Transfers Overall transfer level: Needs assistance Equipment used: None Transfers: Sit to/from Stand Sit to Stand: Min guard           General transfer comment: minG for safety, lateral instability initially    Ambulation/Gait Ambulation/Gait assistance: Min guard Gait Distance (Feet): 200 Feet Assistive device: None Gait Pattern/deviations: Step-through pattern, Drifts right/left Gait velocity: reduced Gait velocity interpretation: <1.31 ft/sec, indicative of household ambulator   General Gait Details: pt drifting into wall initially out of room   Stairs             Wheelchair Mobility    Modified Rankin (Stroke Patients Only)       Balance Overall balance assessment: Needs assistance Sitting-balance support: No upper extremity supported, Feet supported Sitting balance-Leahy Scale: Fair     Standing balance support: No upper extremity supported, During functional activity Standing balance-Leahy Scale: Poor                              Cognition Arousal/Alertness: Lethargic Behavior During Therapy: Flat affect Overall Cognitive Status: Impaired/Different from baseline Area of Impairment: Orientation, Attention, Memory, Following commands, Safety/judgement, Awareness, Problem solving                 Orientation Level: Disoriented to, Time Current Attention Level: Focused Memory: Decreased recall of precautions, Decreased short-term memory Following Commands: Follows one step commands inconsistently, Follows multi-step commands inconsistently Safety/Judgement: Decreased awareness of safety, Decreased awareness of deficits Awareness: Intellectual Problem Solving: Slow processing, Difficulty sequencing, Requires verbal cues          Exercises  General Comments General comments (skin  integrity, edema, etc.): VSS on RA      Pertinent Vitals/Pain Pain Assessment Pain Assessment: No/denies pain    Home Living                          Prior Function            PT Goals (current goals can now be found in the care plan section) Acute Rehab PT Goals Patient Stated Goal: to return to independent PT Goal Formulation: With patient Time For Goal Achievement: 01/16/22 Potential to Achieve Goals: Fair Progress towards PT goals: Progressing toward goals    Frequency    Min 3X/week      PT Plan Current plan remains appropriate    Co-evaluation              AM-PAC PT "6 Clicks" Mobility   Outcome Measure  Help needed turning from your back to your side while in a flat bed without using bedrails?: None Help needed moving from lying on your back to sitting on the side of a flat bed without using bedrails?: None Help needed moving to and from a bed to a chair (including a wheelchair)?: A Little Help needed standing up from a chair using your arms (e.g., wheelchair or bedside chair)?: A Little Help needed to walk in hospital room?: A Little Help needed climbing 3-5 steps with a railing? : A Lot 6 Click Score: 19    End of Session   Activity Tolerance: Patient limited by lethargy Patient left: in bed;with call bell/phone within reach;with bed alarm set Nurse Communication: Mobility status PT Visit Diagnosis: Other abnormalities of gait and mobility (R26.89)     Time: 8366-2947 PT Time Calculation (min) (ACUTE ONLY): 26 min  Charges:  $Gait Training: 8-22 mins                     Arlyss Gandy, PT, DPT Acute Rehabilitation Office [phone]    Arlyss Gandy 01/02/2022, 4:50 PM

## 2022-01-02 NOTE — Progress Notes (Signed)
PROGRESS NOTE        PATIENT DETAILS Name: Natalie Brown Age: 56 y.o. Sex: female Date of Birth: [birth year] Admit Date: 12/24/2021 Admitting Physician Dewayne Shorter Levora Dredge, MD GDJ:MEQAST, [name], NP  Brief Summary: Patient is a 56 y.o.  female with history of bipolar disorder-who was hospitalized at [facility] received a Ativan challenge for possible catatonia-with minimal improvement-due to persistent altered mental status/tachycardia-patient was admitted to [facility] service for further evaluation and treatment.  Significant events: 9/4>> transferred to [facility]-from [facility]-Altered-no improvement after Ativan challenge.  Significant studies: 9/4>> vitamin B12: 290 9/4>> CXR: No obvious pneumonia 9/4>> MRI brain: No acute intracranial abnormality.  Advanced cerebral/cerebellar atrophy 9/4-9/5>> LTM EEG: No seizures. 9/5>> TSH: 1.3 9/5-9/6>> LTM EEG: Negative. 9/6>> CTA chest: No PE-no PNA.  Significant microbiology data: None  Procedures: None   Consults: Neurology, psychiatry  Subjective:  Patient in bed, appears comfortable, denies any headache, no fever, no chest pain or pressure, no shortness of breath , no abdominal pain. No new focal weakness.   Objective: Vitals: Blood pressure 115/62, pulse 84, temperature 97.6 F (36.4 C), temperature source Oral, resp. rate 16, SpO2 100 %.   Exam:  Awake Alert, No new F.N deficits, Normal affect [place].AT,PERRAL Supple Neck, No JVD,   Symmetrical Chest wall movement, Good air movement bilaterally, CTAB RRR,No Gallops, Rubs or new Murmurs,  +ve B.Sounds, Abd Soft, No tenderness,   No Cyanosis, Clubbing or edema    Assessment/Plan:  Acute encephalopathy: Likely delirium in the setting of progressive but undiagnosed dementia (MRI brain showed significant atrophy).  EEG negative for seizures.  Has significant underlying psych issues that could be contributing as well.  No further recommendations from  neurology-unclear baseline but she seems better today.    Elevated D-dimer few days ago, and has improved negative lower extremity venous duplex.  No signs of PE.  Dementia: Significant atrophy on neuroimaging-chronic finding-neurology suspects patient has progressive dementia.  TSH/RPR stable-B12 being supplemented.  Await SNF bed cleared by psych for placement.  Vitamin B12 deficiency: Continue supplementation.  Bipolar disorder: Appreciate psych input-continue Haldol, propanolol .  Per psych requires SNF/memory care.  Tobacco abuse: Continue transdermal nicotine  BMI: Estimated body mass index is 25.62 kg/m as calculated from the following:   Height as of an earlier encounter on 12/24/21: 5\' 6"  (1.676 m).   Weight as of an earlier encounter on 12/24/21: 72 kg.   Code status:   Code Status: Prior   DVT Prophylaxis: enoxaparin (LOVENOX) injection 40 mg Start: 12/26/21 1200    Family Communication: Mother bedside in detail on 12/31/2021.   Disposition Plan: Status is: Inpt The patient will require care spanning > 2 midnights and should be moved to inpatient because: Needs memory care unit-no safe discharge disposition   Planned Discharge Destination: Memory care/SNF-awaiting bed.  Remains medically stable for transfer.  Diet: Diet Order             Diet regular Room service appropriate? No; Fluid consistency: Thin  Diet effective now                    MEDICATIONS: Scheduled Meds:  atropine  1 drop Sublingual TID   enoxaparin (LOVENOX) injection  40 mg Subcutaneous Q24H   feeding supplement (KATE FARMS STANDARD 1.4)  325 mL Oral BID BM   haloperidol  1 mg  Oral Daily   And   haloperidol  2 mg Oral QHS   hydrocerin   Topical BID   nicotine  14 mg Transdermal Daily   Continuous Infusions: PRN Meds:.acetaminophen, alum & mag hydroxide-simeth, docusate sodium, fluticasone, hydrOXYzine, LORazepam, magnesium hydroxide, OLANZapine zydis **AND** ziprasidone, ondansetron,  mouth rinse, polyethylene glycol   I have personally reviewed following labs and imaging studies  LABORATORY DATA:  Recent Labs  Lab 12/30/21 0235 01/02/22 0313  WBC 5.6 5.1  HGB 12.7 13.0  HCT 39.2 38.9  PLT 262 247  MCV 91.6 90.0  MCH 29.7 30.1  MCHC 32.4 33.4  RDW 13.4 13.4    Recent Labs  Lab 12/26/21 1748 12/29/21 1122 12/30/21 0235 01/02/22 0313  NA  --   --  140 140  K  --   --  3.7 4.3  CL  --   --  107 108  CO2  --   --  25 25  GLUCOSE  --   --  100* 93  BUN  --   --  12 11  CREATININE  --   --  1.21* 1.06*  CALCIUM  --   --  8.7* 8.9  AST  --   --  21 23  ALT  --   --  18 21  ALKPHOS  --   --  51 50  BILITOT  --   --  0.2* 0.5  ALBUMIN  --   --  3.0* 3.3*  MG  --   --  2.0 2.2  DDIMER  --  1.21*  --   --   TSH 1.093  --   --   --    RADIOLOGY STUDIES/RESULTS: No results found.   LOS: 7 days   Signature  Susa Raring M.D on 01/02/2022 at 9:53 AM   -  To page go to [email or web address]

## 2022-01-02 NOTE — TOC Progression Note (Signed)
Transition of Care [facility]) - Progression Note    Patient Details  Name: Natalie Brown MRN: [id number] Date of Birth: October 15, 1965  Transition of Care [facility]) CM/SW Contact  Mearl Latin, LCSW Phone Number: 01/02/2022, 2:35 PM  Clinical Narrative:    Per MD, hospital unable to provide official diagnosis of Dementia in this acute setting. Therefore, patient is not able to qualify for a memory care unit. Will discuss group home options with Leadership.      Barriers to Discharge: Financial Resources, Inadequate or no insurance, Homeless with medical needs  Expected Discharge Plan and Services   In-house Referral: Clinical Social Work   Post Acute Care Choice: Nursing Home Living arrangements for the past 2 months: Homeless [facility]) Expected Discharge Date: 12/24/21                                     Social Determinants of Health (SDOH) Interventions    Readmission Risk Interventions     No data to display

## 2022-01-02 NOTE — Progress Notes (Signed)
   01/02/22 1649  Assess: MEWS Score  Temp 98.4 F (36.9 C)  BP 115/75  MAP (mmHg) 87  Pulse Rate (!) 116  Resp 19  Level of Consciousness Alert  SpO2 94 %  O2 Device Room Air  Assess: MEWS Score  MEWS Temp 0  MEWS Systolic 0  MEWS Pulse 2  MEWS RR 0  MEWS LOC 0  MEWS Score 2  MEWS Score Color Yellow  Assess: if the MEWS score is Yellow or Red  Were vital signs taken at a resting state? Yes  Focused Assessment Change from prior assessment (see assessment flowsheet)  Does the patient meet 2 or more of the SIRS criteria? No  Does the patient have a confirmed or suspected source of infection? No  Provider and Rapid Response Notified? No  MEWS guidelines implemented *See Row Information* Yes  Treat  MEWS Interventions Administered scheduled meds/treatments  Pain Scale 0-10  Pain Score 0  Take Vital Signs  Increase Vital Sign Frequency  Yellow: Q 2hr X 2 then Q 4hr X 2, if remains yellow, continue Q 4hrs  Escalate  MEWS: Escalate Yellow: discuss with charge nurse/RN and consider discussing with provider and RRT  Notify: Charge Nurse/RN  Name of Charge Nurse/RN Notified Erin,RN  Date Charge Nurse/RN Notified 01/02/22  Time Charge Nurse/RN Notified 1745  Notify: Provider  Provider Name/Title Susa Raring  Date Provider Notified 01/02/22  Time Provider Notified 1820  Method of Notification Page  Notification Reason Change in status  Provider response See new orders  Date of Provider Response 01/02/22  Time of Provider Response 1821  Document  Patient Outcome Not stable and remains on department  Progress note created (see row info) Yes  Assess: SIRS CRITERIA  SIRS Temperature  0  SIRS Pulse 1  SIRS Respirations  0  SIRS WBC 1  SIRS Score Sum  2

## 2022-01-02 NOTE — Progress Notes (Signed)
Occupational Therapy Treatment Patient Details Name: Natalie Brown MRN: [id number] DOB: [birth year] Today's Date: 01/02/2022   History of present illness Patient is a 56 y.o.  female with history of bipolar disorder-who was hospitalized at [facility] received a Ativan challenge for possible catatonia-with minimal improvement-due to persistent altered mental status/tachycardia-patient was admitted to [facility] service for further evaluation and treatment.   OT comments  Patient received in bed and agreeable to OT session. Patient required initiation cue to get to EOB and ambulated to sink without an assistive device. Patient able to perform grooming tasks with verbal cues to transition task from oral care to face hygiene. Patient performed LB dressing with changing socks and applying lotion while seated on EOB with supervision. Acute OT to continue to follow.    Recommendations for follow up therapy are one component of a multi-disciplinary discharge planning process, led by the attending physician.  Recommendations may be updated based on patient status, additional functional criteria and insurance authorization.    Follow Up Recommendations  Outpatient OT    Assistance Recommended at Discharge Frequent or constant Supervision/Assistance  Patient can return home with the following  A little help with bathing/dressing/bathroom;Assistance with cooking/housework;Direct supervision/assist for medications management;Direct supervision/assist for financial management;Assist for transportation;A little help with walking and/or transfers;Help with stairs or ramp for entrance   Equipment Recommendations  None recommended by OT    Recommendations for Other Services      Precautions / Restrictions Precautions Precautions: Fall Precaution Comments: EEG monitoring - it was discounted when therapist entered room. Restrictions Weight Bearing Restrictions: No       Mobility Bed Mobility Overal bed  mobility: Modified Independent Bed Mobility: Supine to Sit, Sit to Supine           General bed mobility comments: initiation cues to get out of bed    Transfers Overall transfer level: Needs assistance Equipment used: None Transfers: Sit to/from Stand Sit to Stand: Supervision           General transfer comment: supervision for safety     Balance Overall balance assessment: Needs assistance Sitting-balance support: Feet supported Sitting balance-Leahy Scale: Good     Standing balance support: No upper extremity supported, During functional activity Standing balance-Leahy Scale: Good Standing balance comment: able to stand at sink for grooming tasks                           ADL either performed or assessed with clinical judgement   ADL Overall ADL's : Needs assistance/impaired     Grooming: Wash/dry hands;Wash/dry face;Oral care;Min guard;Standing Grooming Details (indicate cue type and reason): verbal cues to sequence from oral care to washing face             Lower Body Dressing: Supervision/safety;Sitting/lateral leans Lower Body Dressing Details (indicate cue type and reason): changed socks and applied lotion to feet while seated on EOB with verbal cues               General ADL Comments: verbal cues for sequencing and changing tasks    Extremity/Trunk Assessment              Vision       Perception     Praxis      Cognition Arousal/Alertness: Awake/alert Behavior During Therapy: Flat affect Overall Cognitive Status: Impaired/Different from baseline Area of Impairment: Attention, Following commands, Safety/judgement, Awareness, Problem solving  Current Attention Level: Sustained   Following Commands: Follows one step commands consistently, Follows multi-step commands inconsistently Safety/Judgement: Decreased awareness of safety, Decreased awareness of deficits Awareness: Intellectual Problem  Solving: Requires verbal cues, Decreased initiation General Comments: able to follow one step commands with difficulty for multi-step        Exercises      Shoulder Instructions       General Comments      Pertinent Vitals/ Pain       Pain Assessment Pain Assessment: Faces Faces Pain Scale: No hurt Pain Intervention(s): Monitored during session  Home Living                                          Prior Functioning/Environment              Frequency  Min 2X/week        Progress Toward Goals  OT Goals(current goals can now be found in the care plan section)  Progress towards OT goals: Progressing toward goals  Acute Rehab OT Goals Patient Stated Goal: get better OT Goal Formulation: With patient Time For Goal Achievement: 01/09/22 Potential to Achieve Goals: Good ADL Goals Pt Will Perform Grooming: Independently;standing Pt Will Perform Upper Body Dressing: Independently;sitting Pt Will Perform Lower Body Dressing: Independently;sit to/from stand Additional ADL Goal #1: Pt will indep complete IADL medication management task Additional ADL Goal #2: pt will follow 2-3 step commands 50% of the session to complete functional tasks  Plan Discharge plan remains appropriate;Frequency remains appropriate    Co-evaluation                 AM-PAC OT "6 Clicks" Daily Activity     Outcome Measure   Help from another person eating meals?: A Little Help from another person taking care of personal grooming?: A Little Help from another person toileting, which includes using toliet, bedpan, or urinal?: A Little Help from another person bathing (including washing, rinsing, drying)?: A Little Help from another person to put on and taking off regular upper body clothing?: A Little Help from another person to put on and taking off regular lower body clothing?: A Little 6 Click Score: 18    End of Session    OT Visit Diagnosis: Muscle weakness  (generalized) (M62.81);History of falling (Z91.81);Other symptoms and signs involving cognitive function   Activity Tolerance Patient tolerated treatment well   Patient Left in bed;with call bell/phone within reach;with bed alarm set;with nursing/sitter in room   Nurse Communication Mobility status        Time: 1415-1430 OT Time Calculation (min): 15 min  Charges: OT General Charges $OT Visit: 1 Visit OT Treatments $Self Care/Home Management : 8-22 mins  Alfonse Flavors, OTA Acute Rehabilitation Services  Office [phone]   Dewain Penning 01/02/2022, 2:43 PM

## 2022-01-02 NOTE — Progress Notes (Signed)
Mobility Specialist Progress Note:   01/02/22 1000  Mobility  Activity Ambulated with assistance in hallway  Level of Assistance Moderate assist, patient does 50-74%  Assistive Device None  Distance Ambulated (ft) 300 ft  Activity Response Tolerated well  $Mobility charge 1 Mobility   Pt agreeable to mobility session. Noted x3 significant LOBs requiring up to modA to correct. Pt back in bed with all needs met, bed alarm on.   Nelta Numbers Acute Rehab Secure Chat or Office Phone: [phone]

## 2022-01-03 ENCOUNTER — Inpatient Hospital Stay (HOSPITAL_COMMUNITY): Payer: MEDICAID

## 2022-01-03 DIAGNOSIS — Z23 Encounter for immunization: Secondary | ICD-10-CM | POA: Diagnosis not present

## 2022-01-03 DIAGNOSIS — E43 Unspecified severe protein-calorie malnutrition: Secondary | ICD-10-CM | POA: Diagnosis not present

## 2022-01-03 DIAGNOSIS — Z66 Do not resuscitate: Secondary | ICD-10-CM | POA: Diagnosis not present

## 2022-01-03 DIAGNOSIS — G9341 Metabolic encephalopathy: Secondary | ICD-10-CM | POA: Diagnosis not present

## 2022-01-03 MED ORDER — METOPROLOL TARTRATE 12.5 MG HALF TABLET
12.5000 mg | ORAL_TABLET | Freq: Two times a day (BID) | ORAL | Status: DC
  Administered 2022-01-03 – 2022-01-07 (×8): 12.5 mg via ORAL
  Filled 2022-01-03 (×9): qty 1

## 2022-01-03 MED ORDER — VITAMIN B-12 1000 MCG PO TABS
1000.0000 ug | ORAL_TABLET | Freq: Every day | ORAL | Status: DC
  Administered 2022-01-03 – 2022-04-08 (×93): 1000 ug via ORAL
  Filled 2022-01-03 (×97): qty 1

## 2022-01-03 NOTE — Progress Notes (Signed)
  [facility] Adult Case Management Discharge Plan :  Will you be returning to the same living situation after discharge:  No. Patient admitted to medical floor. At discharge, do you have transportation home?: N/A Do you have the ability to pay for your medications: No.  Release of information consent forms completed and in the chart;  Patient's signature needed at discharge.  Patient to Follow up at:  Follow-up Information     [facility]. Go to.   Specialty: Behavioral Health Why: Please go to this provider to obtain therapy and medication management services, on Mondays or Wednesdays at 7:30 am.  Services are provided on a first come, first served basis. Contact information: [address] [place] [address] [phone]        [facility]. Go to.   Specialty: Professional Counselor Why: You may also go to this provider for therapy and medication management services during walk in hours for new patients:  Monday through Friday, from 9:00 am to 1:00 pm. Contact information: [address] [place] [address] [phone]         [facility] Follow up.   Why: You may call this provider to schedule an appointment for primary care services, as soon as possible. Contact information: [address] [place] [address] [phone]        [name], [name], NP Follow up.   Why: Please call this provider or [facility] [place] [facility], to schedule an appointment for primary care services as soon as possible. Contact information: [address] [place] [address] [phone]                 Next level of care provider has access to [facility] Link:no  Safety Planning and Suicide Prevention discussed: Yes,  SPE completed with patient and [name], Mother/Guardian   Has patient been referred to the Quitline?:  Patient refused referral Tobacco Use: High Risk (12/24/2021)   Patient History    Smoking Tobacco Use: Every Day    Smokeless Tobacco Use: Current    Passive Exposure: Not on file   Patient has been referred for addiction treatment: N/A Patient denies active substance use, UDS Negative for all, screened low risk during nursing admission (see [facility] quick tab).  Social History   Substance and Sexual Activity  Drug Use No   Social History   Substance and Sexual Activity  Alcohol Use No   [name], LCSWA 01/03/2022, 11:13 AM

## 2022-01-03 NOTE — Progress Notes (Signed)
PROGRESS NOTE        PATIENT DETAILS Name: Natalie Brown Age: 56 y.o. Sex: female Date of Birth: 1965/11/11 Admit Date: 12/24/2021 Admitting Physician Dewayne Shorter Levora Dredge, MD [id number], [name], NP  Brief Summary: Patient is a 56 y.o.  female with history of bipolar disorder-who was hospitalized at [facility] received a Ativan challenge for possible catatonia-with minimal improvement-due to persistent altered mental status/tachycardia-patient was admitted to [facility] service for further evaluation and treatment.  Significant events: 9/4>> transferred to [facility]-from [facility]-Altered-no improvement after Ativan challenge.  Significant studies: 9/4>> vitamin B12: 290 9/4>> CXR: No obvious pneumonia 9/4>> MRI brain: No acute intracranial abnormality.  Advanced cerebral/cerebellar atrophy 9/4-9/5>> LTM EEG: No seizures. 9/5>> TSH: 1.3 9/5-9/6>> LTM EEG: Negative. 9/6>> CTA chest: No PE-no PNA.  Significant microbiology data: None  Procedures: None   Consults: Neurology, psychiatry  Subjective:  Patient in bed, appears comfortable, denies any headache, no fever, no chest pain or pressure, no shortness of breath , no abdominal pain. No new focal weakness.   Objective: Vitals: Blood pressure 124/74, pulse (!) 104, temperature 98.5 F (36.9 C), temperature source Oral, resp. rate 18, SpO2 98 %.   Exam:  Awake Alert, No new F.N deficits, Normal affect [place].AT,PERRAL Supple Neck, No JVD,   Symmetrical Chest wall movement, Good air movement bilaterally, CTAB RRR,No Gallops, Rubs or new Murmurs,  +ve B.Sounds, Abd Soft, No tenderness,   No Cyanosis, Clubbing or edema    Assessment/Plan:  Acute encephalopathy: Likely delirium in the setting of progressive but undiagnosed dementia (MRI brain showed significant atrophy).  EEG negative for seizures.  Has significant underlying psych issues that could be contributing as well.  No further recommendations from  neurology-unclear baseline but she seems better today.    Elevated D-dimer few days ago, and has improved negative lower extremity venous duplex.  No signs of PE.  Dementia: Significant atrophy on neuroimaging-chronic finding-neurology suspects patient has progressive dementia.  TSH/RPR stable-B12 being supplemented.  Await SNF bed cleared by psych for placement.  Vitamin B12 deficiency: Continue supplementation.  Bipolar disorder: Appreciate psych input-continue Haldol, propanolol .  Per psych requires SNF/memory care.  Tachycardia upon admission likely due to combination of dehydration and atropine use, atropine discontinued, has been hydrated.  Continue to monitor closely.  As needed IV fluids for dehydration.  On low-dose beta-blocker, TSH was stable.  Tobacco abuse: Continue transdermal nicotine  BMI: Estimated body mass index is 25.62 kg/m as calculated from the following:   Height as of an earlier encounter on 12/24/21: 5\' 6"  (1.676 m).   Weight as of an earlier encounter on 12/24/21: 72 kg.   Code status:   Code Status: Prior   DVT Prophylaxis: enoxaparin (LOVENOX) injection 40 mg Start: 12/26/21 1200    Family Communication: Mother bedside in detail on 12/31/2021.   Disposition Plan: Status is: Inpt The patient will require care spanning > 2 midnights and should be moved to inpatient because: Needs memory care unit-no safe discharge disposition   Planned Discharge Destination: Memory care/SNF-awaiting bed.  Remains medically stable for transfer.  Diet: Diet Order             Diet regular Room service appropriate? No; Fluid consistency: Thin  Diet effective now                    MEDICATIONS: Scheduled  Meds:  atropine  1 drop Sublingual TID   docusate sodium  200 mg Oral BID   enoxaparin (LOVENOX) injection  40 mg Subcutaneous Q24H   feeding supplement (KATE FARMS STANDARD 1.4)  325 mL Oral BID BM   haloperidol  1 mg Oral Daily   And   haloperidol  2 mg  Oral QHS   hydrocerin   Topical BID   metoprolol tartrate  5 mg Intravenous Once   metoprolol tartrate  12.5 mg Oral BID   nicotine  14 mg Transdermal Daily   polyethylene glycol  17 g Oral BID   Continuous Infusions: PRN Meds:.acetaminophen, alum & mag hydroxide-simeth, fluticasone, hydrOXYzine, LORazepam, magnesium hydroxide, metoprolol tartrate, OLANZapine zydis **AND** ziprasidone, ondansetron, mouth rinse   I have personally reviewed following labs and imaging studies  LABORATORY DATA:  Recent Labs  Lab 12/30/21 0235 01/02/22 0313  WBC 5.6 5.1  HGB 12.7 13.0  HCT 39.2 38.9  PLT 262 247  MCV 91.6 90.0  MCH 29.7 30.1  MCHC 32.4 33.4  RDW 13.4 13.4    Recent Labs  Lab 12/29/21 1122 12/30/21 0235 01/02/22 0313  NA  --  140 140  K  --  3.7 4.3  CL  --  107 108  CO2  --  25 25  GLUCOSE  --  100* 93  BUN  --  12 11  CREATININE  --  1.21* 1.06*  CALCIUM  --  8.7* 8.9  AST  --  21 23  ALT  --  18 21  ALKPHOS  --  51 50  BILITOT  --  0.2* 0.5  ALBUMIN  --  3.0* 3.3*  MG  --  2.0 2.2  DDIMER 1.21*  --   --    Lab Results  Component Value Date   TSH 1.093 12/26/2021    RADIOLOGY STUDIES/RESULTS: DG Abd Portable 1V  Result Date: 01/03/2022 CLINICAL DATA:  Constipation EXAM: PORTABLE ABDOMEN - 1 VIEW COMPARISON:  None Available. FINDINGS: Bowel gas pattern is nonspecific. Small amount of stool is seen in colon and rectum. No abnormal masses are seen. There is 3 mm calcific density overlying the right kidney. Kidneys are partly obscured by bowel contents. IMPRESSION: Nonspecific bowel gas pattern. Small stool burden in colon. Possible 3 mm right renal calculus. Electronically Signed   By: Ernie Avena M.D.   On: 01/03/2022 08:19     LOS: 8 days   Signature  Susa Raring M.D on 01/03/2022 at 9:01 AM   -  To page go to [email or web address]

## 2022-01-04 LAB — CBC WITH DIFFERENTIAL/PLATELET
Abs Immature Granulocytes: 0.01 10*3/uL (ref 0.00–0.07)
Basophils Absolute: 0 10*3/uL (ref 0.0–0.1)
Basophils Relative: 0 %
Eosinophils Absolute: 0.1 10*3/uL (ref 0.0–0.5)
Eosinophils Relative: 3 %
HCT: 39 % (ref 36.0–46.0)
Hemoglobin: 13.1 g/dL (ref 12.0–15.0)
Immature Granulocytes: 0 %
Lymphocytes Relative: 24 %
Lymphs Abs: 1.1 10*3/uL (ref 0.7–4.0)
MCH: 30 pg (ref 26.0–34.0)
MCHC: 33.6 g/dL (ref 30.0–36.0)
MCV: 89.4 fL (ref 80.0–100.0)
Monocytes Absolute: 0.2 10*3/uL (ref 0.1–1.0)
Monocytes Relative: 5 %
Neutro Abs: 3.2 10*3/uL (ref 1.7–7.7)
Neutrophils Relative %: 68 %
Platelets: 266 10*3/uL (ref 150–400)
RBC: 4.36 MIL/uL (ref 3.87–5.11)
RDW: 13.4 % (ref 11.5–15.5)
WBC: 4.7 10*3/uL (ref 4.0–10.5)
nRBC: 0 % (ref 0.0–0.2)

## 2022-01-04 LAB — MAGNESIUM: Magnesium: 2.1 mg/dL (ref 1.7–2.4)

## 2022-01-04 LAB — BASIC METABOLIC PANEL
Anion gap: 8 (ref 5–15)
BUN: 10 mg/dL (ref 6–20)
CO2: 23 mmol/L (ref 22–32)
Calcium: 9.1 mg/dL (ref 8.9–10.3)
Chloride: 108 mmol/L (ref 98–111)
Creatinine, Ser: 1.11 mg/dL — ABNORMAL HIGH (ref 0.44–1.00)
GFR, Estimated: 59 mL/min — ABNORMAL LOW (ref >60)
Glucose, Bld: 156 mg/dL — ABNORMAL HIGH (ref 70–99)
Potassium: 4.3 mmol/L (ref 3.5–5.1)
Sodium: 139 mmol/L (ref 135–145)

## 2022-01-04 NOTE — Progress Notes (Signed)
PROGRESS NOTE        PATIENT DETAILS Name: Natalie Brown Age: 56 y.o. Sex: female Date of Birth: January 24, 1966 Admit Date: 12/24/2021 Admitting Physician Dewayne Shorter Levora Dredge, MD [id number], [name], NP  Brief Summary: Patient is a 56 y.o.  female with history of bipolar disorder-who was hospitalized at [facility] received a Ativan challenge for possible catatonia-with minimal improvement-due to persistent altered mental status/tachycardia-patient was admitted to [facility] service for further evaluation and treatment.  Significant events: 9/4>> transferred to [facility]-from [facility]-Altered-no improvement after Ativan challenge.  Significant studies: 9/4>> vitamin B12: 290 9/4>> CXR: No obvious pneumonia 9/4>> MRI brain: No acute intracranial abnormality.  Advanced cerebral/cerebellar atrophy 9/4-9/5>> LTM EEG: No seizures. 9/5>> TSH: 1.3 9/5-9/6>> LTM EEG: Negative. 9/6>> CTA chest: No PE-no PNA.  Significant microbiology data: None  Procedures: None   Consults: Neurology, psychiatry  Subjective:  Patient in bed, appears comfortable, denies any headache, no fever, no chest pain or pressure, no shortness of breath , no abdominal pain. No new focal weakness.    Objective: Vitals: Blood pressure 117/78, pulse 99, temperature 98 F (36.7 C), temperature source Oral, resp. rate 16, SpO2 100 %.   Exam:  Awake Alert, No new F.N deficits, Normal affect [place].AT,PERRAL Supple Neck, No JVD,   Symmetrical Chest wall movement, Good air movement bilaterally, CTAB RRR,No Gallops, Rubs or new Murmurs,  +ve B.Sounds, Abd Soft, No tenderness,   No Cyanosis, Clubbing or edema     Assessment/Plan:  Acute encephalopathy: Likely delirium in the setting of progressive but undiagnosed dementia (MRI brain showed significant atrophy).  EEG negative for seizures.  Has significant underlying psych issues that could be contributing as well.  No further recommendations from  neurology-unclear baseline but she seems better today.    Elevated D-dimer few days ago, and has improved negative lower extremity venous duplex.  No signs of PE.  Dementia: Significant atrophy on neuroimaging-chronic finding-neurology suspects patient has progressive dementia.  TSH/RPR stable-B12 being supplemented.  Await SNF bed cleared by psych for placement.  Vitamin B12 deficiency: Continue supplementation.  Bipolar disorder: Appreciate psych input-continue Haldol, propanolol .  Per psych requires SNF/memory care.  Tachycardia upon admission likely due to combination of dehydration and atropine use, atropine discontinued, has been hydrated.  Continue to monitor closely.  As needed IV fluids for dehydration.  On low-dose beta-blocker, TSH was stable.  Tobacco abuse: Continue transdermal nicotine  BMI: Estimated body mass index is 25.62 kg/m as calculated from the following:   Height as of an earlier encounter on 12/24/21: 5\' 6"  (1.676 m).   Weight as of an earlier encounter on 12/24/21: 72 kg.   Code status:   Code Status: Prior   DVT Prophylaxis: enoxaparin (LOVENOX) injection 40 mg Start: 12/26/21 1200    Family Communication: Mother bedside in detail on 12/31/2021.   Disposition Plan: Status is: Inpt The patient will require care spanning > 2 midnights and should be moved to inpatient because: Needs memory care unit-no safe discharge disposition   Planned Discharge Destination: Memory care/SNF-awaiting bed.  Remains medically stable for transfer.  Diet: Diet Order             Diet regular Room service appropriate? No; Fluid consistency: Thin  Diet effective now                    MEDICATIONS:  Scheduled Meds:  atropine  1 drop Sublingual TID   vitamin B-12  1,000 mcg Oral Daily   docusate sodium  200 mg Oral BID   enoxaparin (LOVENOX) injection  40 mg Subcutaneous Q24H   feeding supplement (KATE FARMS STANDARD 1.4)  325 mL Oral BID BM   haloperidol  1 mg Oral  Daily   And   haloperidol  2 mg Oral QHS   hydrocerin   Topical BID   metoprolol tartrate  12.5 mg Oral BID   nicotine  14 mg Transdermal Daily   polyethylene glycol  17 g Oral BID   Continuous Infusions: PRN Meds:.acetaminophen, alum & mag hydroxide-simeth, fluticasone, hydrOXYzine, LORazepam, magnesium hydroxide, metoprolol tartrate, OLANZapine zydis **AND** ziprasidone, ondansetron, mouth rinse   I have personally reviewed following labs and imaging studies  LABORATORY DATA:  Recent Labs  Lab 12/30/21 0235 01/02/22 0313  WBC 5.6 5.1  HGB 12.7 13.0  HCT 39.2 38.9  PLT 262 247  MCV 91.6 90.0  MCH 29.7 30.1  MCHC 32.4 33.4  RDW 13.4 13.4    Recent Labs  Lab 12/29/21 1122 12/30/21 0235 01/02/22 0313  NA  --  140 140  K  --  3.7 4.3  CL  --  107 108  CO2  --  25 25  GLUCOSE  --  100* 93  BUN  --  12 11  CREATININE  --  1.21* 1.06*  CALCIUM  --  8.7* 8.9  AST  --  21 23  ALT  --  18 21  ALKPHOS  --  51 50  BILITOT  --  0.2* 0.5  ALBUMIN  --  3.0* 3.3*  MG  --  2.0 2.2  DDIMER 1.21*  --   --    Lab Results  Component Value Date   TSH 1.093 12/26/2021    RADIOLOGY STUDIES/RESULTS: DG Abd Portable 1V  Result Date: 01/03/2022 CLINICAL DATA:  Constipation EXAM: PORTABLE ABDOMEN - 1 VIEW COMPARISON:  None Available. FINDINGS: Bowel gas pattern is nonspecific. Small amount of stool is seen in colon and rectum. No abnormal masses are seen. There is 3 mm calcific density overlying the right kidney. Kidneys are partly obscured by bowel contents. IMPRESSION: Nonspecific bowel gas pattern. Small stool burden in colon. Possible 3 mm right renal calculus. Electronically Signed   By: Ernie Avena M.D.   On: 01/03/2022 08:19     LOS: 9 days   Signature  Susa Raring M.D on 01/04/2022 at 8:48 AM   -  To page go to [email or web address]

## 2022-01-04 NOTE — Progress Notes (Signed)
Mobility Specialist Progress Note:   01/04/22 1105  Mobility  Activity Ambulated with assistance in hallway  Level of Assistance Minimal assist, patient does 75% or more  Assistive Device None  Distance Ambulated (ft) 500 ft  Activity Response Tolerated well  $Mobility charge 1 Mobility   Pt agreeable to mobility session. Required up to minA to steady throughout ambulation. Pt back in bed with all needs met, sitter present.   Nelta Numbers Acute Rehab Secure Chat or Office Phone: [phone]

## 2022-01-04 NOTE — Progress Notes (Addendum)
Physical Therapy Treatment Patient Details Name: Natalie Brown MRN: [id number] DOB: [birth year] Today's Date: 01/04/2022   History of Present Illness Patient is a 56 y.o.  female with history of bipolar disorder-who was hospitalized at [facility] received a Ativan challenge for possible catatonia-with minimal improvement-due to persistent altered mental status/tachycardia-patient was admitted to [facility] service for further evaluation and treatment.    PT Comments    Pt received in supine, alert and able to state DOB, pt agreeable to therapy session with emphasis on transfer and gait training. Pt with multiple LOB appropriately requesting unilateral hand-held assist to navigate in room/hallway and needing consistent minA. May consider adding post-acute therapies pending progress next session, pt requiring increased assist for stability. Pt continues to benefit from PT services to progress toward functional mobility goals.    Recommendations for follow up therapy are one component of a multi-disciplinary discharge planning process, led by the attending physician.  Recommendations may be updated based on patient status, additional functional criteria and insurance authorization.  Follow Up Recommendations  No PT follow up     Assistance Recommended at Discharge Intermittent Supervision/Assistance  Patient can return home with the following Direct supervision/assist for medications management;Direct supervision/assist for financial management;Assist for transportation   Equipment Recommendations  None recommended by PT    Recommendations for Other Services       Precautions / Restrictions Precautions Precautions: Fall Restrictions Weight Bearing Restrictions: No     Mobility  Bed Mobility Overal bed mobility: Modified Independent Bed Mobility: Supine to Sit     Supine to sit: Modified independent (Device/Increase time) Sit to supine: Supervision   General bed mobility comments: cues  for body mechanics    Transfers Overall transfer level: Needs assistance Equipment used: None Transfers: Sit to/from Stand Sit to Stand: Min guard           General transfer comment: minG for safety    Ambulation/Gait Ambulation/Gait assistance: Min assist Gait Distance (Feet): 400 Feet Assistive device: None, 1 person hand held assist Gait Pattern/deviations: Step-through pattern, Drifts right/left Gait velocity: reduced     General Gait Details: Pt lateral LOB to her L side exiting the room and reaching for HHA to correct after a delay, continuing to request HHA afterward, frequent minA and multiple lateral LOB this date   Stairs             Wheelchair Mobility    Modified Rankin (Stroke Patients Only)       Balance Overall balance assessment: Needs assistance Sitting-balance support: No upper extremity supported, Feet supported Sitting balance-Leahy Scale: Fair     Standing balance support: No upper extremity supported, During functional activity Standing balance-Leahy Scale: Poor                              Cognition Arousal/Alertness: Awake/alert Behavior During Therapy: Flat affect Overall Cognitive Status: Impaired/Different from baseline Area of Impairment: Orientation, Attention, Memory, Following commands, Safety/judgement, Awareness, Problem solving                 Orientation Level: Disoriented to, Time Current Attention Level: Focused Memory: Decreased recall of precautions, Decreased short-term memory Following Commands: Follows one step commands inconsistently, Follows multi-step commands inconsistently Safety/Judgement: Decreased awareness of safety, Decreased awareness of deficits Awareness: Intellectual Problem Solving: Slow processing, Difficulty sequencing, Requires verbal cues General Comments: able to follow one step commands with difficulty for multi-step; minimally verbal, calm demeanor/flat affect.  Exercises      General Comments General comments (skin integrity, edema, etc.): BP 131/79 prior to mobility and 114/68 after, SpO2/HR [facility]      Pertinent Vitals/Pain Pain Assessment Pain Assessment: No/denies pain Pain Score: 0-No pain Breathing: normal Negative Vocalization: none Facial Expression: smiling or inexpressive Body Language: relaxed Consolability: no need to console PAINAD Score: 0 Pain Intervention(s): Monitored during session, Repositioned     PT Goals (current goals can now be found in the care plan section) Acute Rehab PT Goals Patient Stated Goal: to return to independent PT Goal Formulation: With patient Time For Goal Achievement: 01/16/22 Progress towards PT goals: Progressing toward goals    Frequency    Min 3X/week      PT Plan Current plan remains appropriate       AM-PAC PT "6 Clicks" Mobility   Outcome Measure  Help needed turning from your back to your side while in a flat bed without using bedrails?: None Help needed moving from lying on your back to sitting on the side of a flat bed without using bedrails?: A Little Help needed moving to and from a bed to a chair (including a wheelchair)?: A Little Help needed standing up from a chair using your arms (e.g., wheelchair or bedside chair)?: A Little Help needed to walk in hospital room?: A Little Help needed climbing 3-5 steps with a railing? : A Lot 6 Click Score: 18    End of Session Equipment Utilized During Treatment: Gait belt Activity Tolerance: Patient tolerated treatment well Patient left: in bed;with call bell/phone within reach;with bed alarm set;with nursing/sitter in room Psychiatrist present) Nurse Communication: Mobility status PT Visit Diagnosis: Other abnormalities of gait and mobility (R26.89)     Time: 8295-6213 PT Time Calculation (min) (ACUTE ONLY): 16 min  Charges:  $Gait Training: 8-22 mins                     Natalie Arreguin P., PTA Acute Rehabilitation  Services Secure Chat Preferred 9a-5:30pm Office: [phone]    [place] 01/04/2022, 6:33 PM

## 2022-01-04 NOTE — TOC Progression Note (Signed)
Transition of Care [facility]) - Progression Note    Patient Details  Name: Natalie Brown MRN: [id number] Date of Birth: 09/18/1965  Transition of Care [facility]) CM/SW Contact  Mearl Latin, LCSW Phone Number: 01/04/2022, 4:56 PM  Clinical Narrative:    CSW searching for family care homes as patient does not have a formal Dementia diagnosis and does not qualify for a locked memory care unit. Leadership would approve an Letter of Guarantee if facility willing to accept.      Barriers to Discharge: Financial Resources, Inadequate or no insurance, Homeless with medical needs  Expected Discharge Plan and Services   In-house Referral: Clinical Social Work   Post Acute Care Choice: Nursing Home Living arrangements for the past 2 months: Homeless [facility]) Expected Discharge Date: 12/24/21                                     Social Determinants of Health (SDOH) Interventions    Readmission Risk Interventions     No data to display

## 2022-01-05 NOTE — Progress Notes (Addendum)
PROGRESS NOTE        PATIENT DETAILS Name: Natalie Brown Age: 56 y.o. Sex: female Date of Birth: Nov 12, 1965 Admit Date: 12/24/2021 Admitting Physician Dewayne Shorter Levora Dredge, MD [id number], [name], NP  Brief Summary: Patient is a 56 y.o.  female with history of bipolar disorder-who was hospitalized at [facility] received a Ativan challenge for possible catatonia-with minimal improvement-due to persistent altered mental status/tachycardia-patient was admitted to [facility] service for further evaluation and treatment.  Significant events: 9/4>> transferred to [facility]-from [facility]-Altered-no improvement after Ativan challenge.  Significant studies: 9/4>> vitamin B12: 290 9/4>> CXR: No obvious pneumonia 9/4>> MRI brain: No acute intracranial abnormality.  Advanced cerebral/cerebellar atrophy 9/4-9/5>> LTM EEG: No seizures. 9/5>> TSH: 1.3 9/5-9/6>> LTM EEG: Negative. 9/6>> CTA chest: No PE-no PNA.  Significant microbiology data: None  Procedures: None   Consults: Neurology, psychiatry  Subjective:  Patient in bed, appears comfortable, denies any headache, no fever, no chest pain or pressure, no shortness of breath , no abdominal pain. No new focal weakness.   Objective: Vitals: Blood pressure 99/62, pulse 80, temperature 98.1 F (36.7 C), temperature source Oral, resp. rate 12, SpO2 96 %.   Exam:  Awake Alert, No new F.N deficits, Normal affect [place].AT,PERRAL Supple Neck, No JVD,   Symmetrical Chest wall movement, Good air movement bilaterally, CTAB RRR,No Gallops, Rubs or new Murmurs,  +ve B.Sounds, Abd Soft, No tenderness,   No Cyanosis, Clubbing or edema    Assessment/Plan:  Acute encephalopathy: Likely delirium in the setting of progressive but undiagnosed dementia (MRI brain showed significant atrophy).  EEG negative for seizures.  Has significant underlying psych issues that could be contributing as well.  No further recommendations from neurology-unclear  baseline but she seems better today.    Elevated D-dimer few days ago, and has improved negative lower extremity venous duplex.  No signs of PE.  Dementia: Significant atrophy on neuroimaging-chronic finding-neurology suspects patient has progressive dementia.  TSH/RPR stable-B12 being supplemented.  Await SNF bed cleared by psych for placement.  Vitamin B12 deficiency: Continue supplementation.  Bipolar disorder: Appreciate psych input-continue Haldol .  Per psych requires SNF/memory care.  Tachycardia upon admission likely due to combination of dehydration and atropine use, atropine discontinued, has been hydrated.  Continue to monitor closely.  As needed IV fluids for dehydration.  On low-dose beta-blocker, TSH was stable.  Tobacco abuse: Continue transdermal nicotine  BMI: Estimated body mass index is 25.62 kg/m as calculated from the following:   Height as of an earlier encounter on 12/24/21: 5\' 6"  (1.676 m).   Weight as of an earlier encounter on 12/24/21: 72 kg.   Code status:   Code Status: Prior   DVT Prophylaxis: enoxaparin (LOVENOX) injection 40 mg Start: 12/26/21 1200    Family Communication: Mother bedside in detail on 12/31/2021.   Disposition Plan: Status is: Inpt The patient will require care spanning > 2 midnights and should be moved to inpatient because: Needs memory care unit-no safe discharge disposition   Planned Discharge Destination: Memory care/SNF-awaiting bed.  Remains medically stable for transfer.  Diet: Diet Order             Diet regular Room service appropriate? No; Fluid consistency: Thin  Diet effective now                    MEDICATIONS: Scheduled Meds:  atropine  1 drop Sublingual TID   vitamin B-12  1,000 mcg Oral Daily   docusate sodium  200 mg Oral BID   enoxaparin (LOVENOX) injection  40 mg Subcutaneous Q24H   feeding supplement (KATE FARMS STANDARD 1.4)  325 mL Oral BID BM   haloperidol  1 mg Oral Daily   And   haloperidol   2 mg Oral QHS   hydrocerin   Topical BID   metoprolol tartrate  12.5 mg Oral BID   nicotine  14 mg Transdermal Daily   polyethylene glycol  17 g Oral BID   Continuous Infusions: PRN Meds:.acetaminophen, alum & mag hydroxide-simeth, fluticasone, hydrOXYzine, LORazepam, magnesium hydroxide, metoprolol tartrate, OLANZapine zydis **AND** ziprasidone, ondansetron, mouth rinse   I have personally reviewed following labs and imaging studies  LABORATORY DATA:  Recent Labs  Lab 12/30/21 0235 01/02/22 0313 01/04/22 0954  WBC 5.6 5.1 4.7  HGB 12.7 13.0 13.1  HCT 39.2 38.9 39.0  PLT 262 247 266  MCV 91.6 90.0 89.4  MCH 29.7 30.1 30.0  MCHC 32.4 33.4 33.6  RDW 13.4 13.4 13.4  LYMPHSABS  --   --  1.1  MONOABS  --   --  0.2  EOSABS  --   --  0.1  BASOSABS  --   --  0.0    Recent Labs  Lab 12/29/21 1122 12/30/21 0235 01/02/22 0313 01/04/22 0954  NA  --  140 140 139  K  --  3.7 4.3 4.3  CL  --  107 108 108  CO2  --  25 25 23   GLUCOSE  --  100* 93 156*  BUN  --  12 11 10   CREATININE  --  1.21* 1.06* 1.11*  CALCIUM  --  8.7* 8.9 9.1  AST  --  21 23  --   ALT  --  18 21  --   ALKPHOS  --  51 50  --   BILITOT  --  0.2* 0.5  --   ALBUMIN  --  3.0* 3.3*  --   MG  --  2.0 2.2 2.1  DDIMER 1.21*  --   --   --    Lab Results  Component Value Date   TSH 1.093 12/26/2021    RADIOLOGY STUDIES/RESULTS: No results found.   LOS: 10 days   Signature  Lala Lund M.D on 01/05/2022 at 10:06 AM   -  To page go to [email or web address]

## 2022-01-06 NOTE — Progress Notes (Signed)
PROGRESS NOTE        PATIENT DETAILS Name: Natalie Brown Age: 56 y.o. Sex: female Date of Birth: 07/24/1965 Admit Date: 12/24/2021 Admitting Physician Evalee Mutton Kristeen Mans, MD [id number], [name], NP  Brief Summary: Patient is a 56 y.o.  female with history of bipolar disorder-who was hospitalized at [facility] received a Ativan challenge for possible catatonia-with minimal improvement-due to persistent altered mental status/tachycardia-patient was admitted to [facility] service for further evaluation and treatment.  Significant events: 9/4>> transferred to [facility]-from [facility]-Altered-no improvement after Ativan challenge.  Significant studies: 9/4>> vitamin B12: 290 9/4>> CXR: No obvious pneumonia 9/4>> MRI brain: No acute intracranial abnormality.  Advanced cerebral/cerebellar atrophy 9/4-9/5>> LTM EEG: No seizures. 9/5>> TSH: 1.3 9/5-9/6>> LTM EEG: Negative. 9/6>> CTA chest: No PE-no PNA.  Significant microbiology data: None  Procedures: None   Consults: Neurology, psychiatry  Subjective:  Patient in bed, appears comfortable, denies any headache, no fever, no chest pain or pressure, no shortness of breath , no abdominal pain. No new focal weakness.   Objective: Vitals: Blood pressure (!) 153/92, pulse (!) 115, temperature 98.5 F (36.9 C), temperature source Oral, resp. rate 20, SpO2 99 %.   Exam:  Awake Alert, No new F.N deficits, Normal affect [place].AT,PERRAL Supple Neck, No JVD,   Symmetrical Chest wall movement, Good air movement bilaterally, CTAB RRR,No Gallops, Rubs or new Murmurs,  +ve B.Sounds, Abd Soft, No tenderness,   No Cyanosis, Clubbing or edema    Assessment/Plan:  Acute encephalopathy: Likely delirium in the setting of progressive but undiagnosed dementia (MRI brain showed significant atrophy).  EEG negative for seizures.  Has significant underlying psych issues that could be contributing as well.  No further recommendations from  neurology-unclear baseline but she seems better today.    Elevated D-dimer few days ago, and has improved negative lower extremity venous duplex.  No signs of PE.  Dementia: Significant atrophy on neuroimaging-chronic finding-neurology suspects patient has progressive dementia.  TSH/RPR stable-B12 being supplemented.  Await SNF bed cleared by psych for placement.  Vitamin B12 deficiency: Continue supplementation.  Bipolar disorder: Appreciate psych input-continue Haldol .  Per psych requires SNF/memory care.  Tachycardia upon admission likely due to combination of dehydration and atropine use, atropine discontinued, has been hydrated.  Continue to monitor closely.  As needed IV fluids for dehydration.  On low-dose beta-blocker, TSH was stable.  Tobacco abuse: Continue transdermal nicotine  BMI: Estimated body mass index is 25.62 kg/m as calculated from the following:   Height as of an earlier encounter on 12/24/21: 5\' 6"  (1.676 m).   Weight as of an earlier encounter on 12/24/21: 72 kg.   Code status:   Code Status: Prior   DVT Prophylaxis: enoxaparin (LOVENOX) injection 40 mg Start: 12/26/21 1200    Family Communication: Mother bedside in detail on 12/31/2021.   Disposition Plan: Status is: Inpt The patient will require care spanning > 2 midnights and should be moved to inpatient because: Needs memory care unit-no safe discharge disposition   Planned Discharge Destination: Memory care/SNF-awaiting bed.  Remains medically stable for transfer.  Diet: Diet Order             Diet regular Room service appropriate? No; Fluid consistency: Thin  Diet effective now                    MEDICATIONS: Scheduled  Meds:  atropine  1 drop Sublingual TID   vitamin B-12  1,000 mcg Oral Daily   docusate sodium  200 mg Oral BID   enoxaparin (LOVENOX) injection  40 mg Subcutaneous Q24H   feeding supplement (KATE FARMS STANDARD 1.4)  325 mL Oral BID BM   haloperidol  1 mg Oral Daily    And   haloperidol  2 mg Oral QHS   hydrocerin   Topical BID   metoprolol tartrate  12.5 mg Oral BID   nicotine  14 mg Transdermal Daily   polyethylene glycol  17 g Oral BID   Continuous Infusions: PRN Meds:.acetaminophen, alum & mag hydroxide-simeth, fluticasone, hydrOXYzine, LORazepam, magnesium hydroxide, metoprolol tartrate, OLANZapine zydis **AND** ziprasidone, ondansetron, mouth rinse   I have personally reviewed following labs and imaging studies  LABORATORY DATA:  Recent Labs  Lab 01/02/22 0313 01/04/22 0954  WBC 5.1 4.7  HGB 13.0 13.1  HCT 38.9 39.0  PLT 247 266  MCV 90.0 89.4  MCH 30.1 30.0  MCHC 33.4 33.6  RDW 13.4 13.4  LYMPHSABS  --  1.1  MONOABS  --  0.2  EOSABS  --  0.1  BASOSABS  --  0.0    Recent Labs  Lab 01/02/22 0313 01/04/22 0954  NA 140 139  K 4.3 4.3  CL 108 108  CO2 25 23  GLUCOSE 93 156*  BUN 11 10  CREATININE 1.06* 1.11*  CALCIUM 8.9 9.1  AST 23  --   ALT 21  --   ALKPHOS 50  --   BILITOT 0.5  --   ALBUMIN 3.3*  --   MG 2.2 2.1   Lab Results  Component Value Date   TSH 1.093 12/26/2021    RADIOLOGY STUDIES/RESULTS: No results found.   LOS: 11 days   Signature  Susa Raring M.D on 01/06/2022 at 9:22 AM   -  To page go to [email or web address]

## 2022-01-07 ENCOUNTER — Inpatient Hospital Stay (HOSPITAL_COMMUNITY): Payer: MEDICAID

## 2022-01-07 MED ORDER — SCOPOLAMINE 1 MG/3DAYS TD PT72
1.0000 | MEDICATED_PATCH | TRANSDERMAL | Status: DC
  Administered 2022-01-07 – 2022-04-25 (×34): 1.5 mg via TRANSDERMAL
  Filled 2022-01-07 (×41): qty 1

## 2022-01-07 MED ORDER — METOPROLOL TARTRATE 50 MG PO TABS
50.0000 mg | ORAL_TABLET | Freq: Two times a day (BID) | ORAL | Status: DC
  Filled 2022-01-07: qty 1

## 2022-01-07 MED ORDER — METOPROLOL TARTRATE 25 MG PO TABS
25.0000 mg | ORAL_TABLET | Freq: Two times a day (BID) | ORAL | Status: DC
  Administered 2022-01-07: 25 mg via ORAL
  Filled 2022-01-07: qty 1

## 2022-01-07 NOTE — Plan of Care (Signed)
  Problem: Safety: Goal: Non-violent Restraint(s) Outcome: Progressing   Problem: Education: Goal: Knowledge of General Education information will improve Description: Including pain rating scale, medication(s)/side effects and non-pharmacologic comfort measures Outcome: Progressing   Problem: Health Behavior/Discharge Planning: Goal: Ability to manage health-related needs will improve Outcome: Progressing   

## 2022-01-07 NOTE — Progress Notes (Signed)
Occupational Therapy Treatment Patient Details Name: Natalie Brown MRN: [id number] DOB: [birth year] Today's Date: 01/07/2022   History of present illness Patient is a 56 y.o.  female with history of bipolar disorder-who was hospitalized at [facility] received a Ativan challenge for possible catatonia-with minimal improvement-due to persistent altered mental status/tachycardia-patient was admitted to [facility] service for further evaluation and treatment.   OT comments  Patient progressing slowly with need of decreased need of assistance for standing sponge bathing and UE dressing compared to previous session. Pt able to balance in standing, lean forward and bathe lower legs without LOB or external assistance. Able to stand at sink for multiple grooming tasks with supervision and cues to move onto another task as appropriate. Patient remains limited by cognitive deficits with mild looking tardive dyskinesia, disorientation to time and situation, difficulty with questions beyond simple yes/no and with instructions/ 1-step commands. Unsure if this is treatable with OT intervention as pt's MRI revealed significant cerebral trophy and pt may require 24/7 supervision without end. Pt continues to demonstrate good rehab potential for functional tasks with supervision, and could benefit from continued skilled OT to increase safety and independence with ADLs and functional transfers to allow pt to return home safely and reduce caregiver burden and fall risk.    Recommendations for follow up therapy are one component of a multi-disciplinary discharge planning process, led by the attending physician.  Recommendations may be updated based on patient status, additional functional criteria and insurance authorization.    Follow Up Recommendations  Outpatient OT    Assistance Recommended at Discharge Frequent or constant Supervision/Assistance  Patient can return home with the following  A little help with  bathing/dressing/bathroom;Assistance with cooking/housework;Direct supervision/assist for medications management;Direct supervision/assist for financial management;Assist for transportation;A little help with walking and/or transfers;Help with stairs or ramp for entrance   Equipment Recommendations  None recommended by OT    Recommendations for Other Services      Precautions / Restrictions Precautions Precautions: Fall Restrictions Weight Bearing Restrictions: No       Mobility Bed Mobility   Bed Mobility: Supine to Sit, Sit to Supine     Supine to sit: Supervision Sit to supine: Supervision, HOB elevated   General bed mobility comments: Increased time for supine to sit and cues for sequence.    Transfers                         Balance Overall balance assessment: Needs assistance Sitting-balance support: No upper extremity supported, Feet supported Sitting balance-Leahy Scale: Good     Standing balance support: During functional activity, No upper extremity supported Standing balance-Leahy Scale: Fair                             ADL either performed or assessed with clinical judgement   ADL Overall ADL's : Needs assistance/impaired Eating/Feeding: Supervision/ safety;Cueing for sequencing Eating/Feeding Details (indicate cue type and reason): Pt refused lunch stating that she had already eaten it. Tray untouched. Pt thought it was breakfast tray. Needed cues to orient to lunch. Grooming: Wash/dry hands;Standing;Applying deodorant;Cueing for sequencing Grooming Details (indicate cue type and reason): verbal cues to sequence from washing hands to wiping face as pt had been actively drooling onto her gown while standing at the sink without any indication of realization that this is occuring. Upper Body Bathing: Standing;Cueing for sequencing;Set up;Supervision/ safety   Lower Body Bathing: Min guard;Sitting/lateral  leans;Cueing for  sequencing Lower Body Bathing Details (indicate cue type and reason): Following bathing, pt applied lotion to LEs and UEs with setup, pt long sitting on bed with increased time for pt to problem solve opening cap before applying, but no need of cues. Upper Body Dressing : Minimal assistance;Standing;Supervision/safety Upper Body Dressing Details (indicate cue type and reason): Pt standing and OT held gown open for pt without verbal cue, using gown as visual cue. Pt raised UEs to assist with threading through gown. With increased time, pt able to stand and tie gown behind her neck with supervision.     Toilet Transfer: Hydrographic surveyor Details (indicate cue type and reason): min guard A for Pt in form of HHA for pt to stand from EOB.  Pt ambulated from bathroom to sink and sink to EOB with supervision to Min guard assist without AD.         Functional mobility during ADLs: Min guard;Supervision/safety;Cueing for safety;Cueing for sequencing General ADL Comments: Cues to terminate one task and begin another.  If pt began a task was unable to stop it until completed despite verbal cues, Example, initiated return to supine, and given verbal cue to stop as pt not finished with bathing, pt unable to stop this activity, but completed sit to supine, pulled bed rail up, then asked, "what do you want me to do?" Pt then attended to instructions to return to EOB but required significantly increased time to complete this task with constant verbal cues for sequence.    Extremity/Trunk Assessment Upper Extremity Assessment Upper Extremity Assessment: Overall WFL for tasks assessed (Possible Tardive dyskinesia with mild tremor to UEs and stiffness to movements, mask-like face.)   Lower Extremity Assessment Lower Extremity Assessment: Overall WFL for tasks assessed   Cervical / Trunk Assessment Cervical / Trunk Assessment: Normal    Vision   Vision Assessment?: No apparent visual deficits    Perception     Praxis      Cognition Arousal/Alertness: Awake/alert Behavior During Therapy: Flat affect Overall Cognitive Status: No family/caregiver present to determine baseline cognitive functioning Area of Impairment: Orientation, Attention, Memory, Following commands, Safety/judgement, Awareness, Problem solving                 Orientation Level: Disoriented to, Time, Situation Current Attention Level: Focused Memory: Decreased recall of precautions, Decreased short-term memory Following Commands: Follows one step commands inconsistently, Follows multi-step commands inconsistently Safety/Judgement: Decreased awareness of safety, Decreased awareness of deficits Awareness: Intellectual Problem Solving: Slow processing, Difficulty sequencing, Requires verbal cues General Comments: Atetmpted Short Blessed Test, but pt unable to participate due to easily distracted and poor attention to instructions. Pt disoriented to year and unable to guess time of day from selections of "Morning, afternoon or night". Pt unable to initiate counting backwards from 20-1 despite cues and encouragement.        Exercises      Shoulder Instructions       General Comments      Pertinent Vitals/ Pain       Pain Assessment Pain Assessment: Faces Faces Pain Scale: No hurt Breathing: normal Negative Vocalization: none Facial Expression: smiling or inexpressive Body Language: relaxed Consolability: no need to console PAINAD Score: 0 Pain Intervention(s): Monitored during session  Home Living  Prior Functioning/Environment              Frequency  Min 2X/week        Progress Toward Goals  OT Goals(current goals can now be found in the care plan section)  Progress towards OT goals: Progressing toward goals  Acute Rehab OT Goals OT Goal Formulation: Patient unable to participate in goal setting Time For Goal  Achievement: 01/09/22 Potential to Achieve Goals: Good  Plan Discharge plan remains appropriate;Frequency remains appropriate    Co-evaluation                 AM-PAC OT "6 Clicks" Daily Activity     Outcome Measure   Help from another person eating meals?: A Little Help from another person taking care of personal grooming?: A Little Help from another person toileting, which includes using toliet, bedpan, or urinal?: A Little Help from another person bathing (including washing, rinsing, drying)?: A Little Help from another person to put on and taking off regular upper body clothing?: A Little Help from another person to put on and taking off regular lower body clothing?: A Little 6 Click Score: 18    End of Session    OT Visit Diagnosis: Muscle weakness (generalized) (M62.81);History of falling (Z91.81);Other symptoms and signs involving cognitive function   Activity Tolerance Patient tolerated treatment well   Patient Left in bed;with call bell/phone within reach;with bed alarm set;with nursing/sitter in room   Nurse Communication Other (comment) (CNA in with OT for session as pt on 1:1)        Time: 5053-9767 OT Time Calculation (min): 32 min  Charges: OT General Charges $OT Visit: 1 Visit OT Treatments $Self Care/Home Management : 8-22 mins $Therapeutic Activity: 8-22 mins  Victorino Dike, OT Acute Rehab Services Office: [phone] 01/07/2022  Theodoro Clock 01/07/2022, 1:12 PM

## 2022-01-07 NOTE — Progress Notes (Signed)
Physical Therapy Treatment Patient Details Name: Natalie Brown MRN: [id number] DOB: [birth year] Today's Date: 01/07/2022   History of Present Illness Patient is a 56 y.o.  female with history of bipolar disorder-who was hospitalized at [facility] received a Ativan challenge for possible catatonia-with minimal improvement-due to persistent altered mental status/tachycardia-patient was admitted to [facility] service for further evaluation and treatment.    PT Comments    Pt received in supine, pleasantly agreeable to therapy session and with good participation and tolerance for gait training with intermittent HHA and up to minA. Pt with elevated HR upon PTA arrival to room and HR remained stable 115-125 bpm with exertion, RN/tele monitor aware. VSS otherwise on RA. Pt continues to benefit from PT services to progress toward functional mobility goals, pt agreeable to trial stair ascent/descent next session for strengthening and endurance building.    Recommendations for follow up therapy are one component of a multi-disciplinary discharge planning process, led by the attending physician.  Recommendations may be updated based on patient status, additional functional criteria and insurance authorization.  Follow Up Recommendations  No PT follow up     Assistance Recommended at Discharge Intermittent Supervision/Assistance  Patient can return home with the following Direct supervision/assist for medications management;Direct supervision/assist for financial management;Assist for transportation   Equipment Recommendations  None recommended by PT    Recommendations for Other Services       Precautions / Restrictions Precautions Precautions: Fall Restrictions Weight Bearing Restrictions: No     Mobility  Bed Mobility Overal bed mobility: Needs Assistance Bed Mobility: Supine to Sit, Sit to Supine     Supine to sit: Supervision Sit to supine: HOB elevated, Min assist   General bed mobility  comments: Increased time for supine to sit and cues for sequence, pt needing minA to raise legs back onto bed with return to supine, pt attempted but unable to complete unassisted likely due to cognitive deficit.    Transfers Overall transfer level: Needs assistance Equipment used: None Transfers: Sit to/from Stand Sit to Stand: Min guard           General transfer comment: minG for safety    Ambulation/Gait Ambulation/Gait assistance: Min assist, Min guard Gait Distance (Feet): 200 Feet Assistive device: None, 1 person hand held assist Gait Pattern/deviations: Step-through pattern, Drifts right/left Gait velocity: reduced     General Gait Details: intermittent minA with lateral LOB, pt with stiff/slow low strides and narrow BOS       Balance Overall balance assessment: Needs assistance Sitting-balance support: No upper extremity supported, Feet supported Sitting balance-Leahy Scale: Good     Standing balance support: During functional activity, No upper extremity supported Standing balance-Leahy Scale: Poor Standing balance comment: static standing fair, dynamic standing poor                            Cognition Arousal/Alertness: Awake/alert Behavior During Therapy: Flat affect Overall Cognitive Status: No family/caregiver present to determine baseline cognitive functioning Area of Impairment: Orientation, Attention, Memory, Following commands, Safety/judgement, Awareness, Problem solving                 Orientation Level: Disoriented to, Time, Situation Current Attention Level: Focused Memory: Decreased recall of precautions, Decreased short-term memory Following Commands: Follows one step commands inconsistently, Follows multi-step commands inconsistently Safety/Judgement: Decreased awareness of safety, Decreased awareness of deficits Awareness: Intellectual Problem Solving: Slow processing, Difficulty sequencing, Requires verbal cues General  Comments: Pt able to  state name and DOB, not answering other orientation questions. Pt answering mostly simple yes/no questions appropriately. Slow processing and often needs dense cues for safer body mechanics and wayfinding/navigation.        Exercises      General Comments General comments (skin integrity, edema, etc.): Pt received with tachy HR (120-124 bpm), RN aware, OK to mobilize and HR remained 110-125 bpm throughout. SpO2 WFL throughout on RA.      Pertinent Vitals/Pain Pain Assessment Pain Assessment: Faces Faces Pain Scale: No hurt Breathing: normal Negative Vocalization: none Facial Expression: smiling or inexpressive Body Language: relaxed Consolability: no need to console PAINAD Score: 0 Pain Intervention(s): Monitored during session, Repositioned           PT Goals (current goals can now be found in the care plan section) Acute Rehab PT Goals Patient Stated Goal: to return to independent PT Goal Formulation: With patient Time For Goal Achievement: 01/16/22 Progress towards PT goals: Progressing toward goals    Frequency    Min 3X/week      PT Plan Current plan remains appropriate       AM-PAC PT "6 Clicks" Mobility   Outcome Measure  Help needed turning from your back to your side while in a flat bed without using bedrails?: None Help needed moving from lying on your back to sitting on the side of a flat bed without using bedrails?: A Little Help needed moving to and from a bed to a chair (including a wheelchair)?: A Little Help needed standing up from a chair using your arms (e.g., wheelchair or bedside chair)?: A Little Help needed to walk in hospital room?: A Little Help needed climbing 3-5 steps with a railing? : A Lot 6 Click Score: 18    End of Session Equipment Utilized During Treatment: Gait belt Activity Tolerance: Patient tolerated treatment well Patient left: in bed;with call bell/phone within reach;with nursing/sitter in  room;Other (comment) (sitter in room, per sitter OK to leave bed alarm off and side rails up so patient can reposition for comfort) Nurse Communication: Mobility status;Other (comment) (tachy) PT Visit Diagnosis: Other abnormalities of gait and mobility (R26.89)     Time: 2353-6144 PT Time Calculation (min) (ACUTE ONLY): 17 min  Charges:  $Gait Training: 8-22 mins                     Neeti Knudtson P., PTA Acute Rehabilitation Services Secure Chat Preferred 9a-5:30pm Office: [phone]    [place]us [place]ms 01/07/2022, 3:18 PM

## 2022-01-07 NOTE — Progress Notes (Signed)
PROGRESS NOTE        PATIENT DETAILS Name: Natalie Brown Age: 56 y.o. Sex: female Date of Birth: 01-Mar-1966 Admit Date: 12/24/2021 Admitting Physician Evalee Mutton Kristeen Mans, MD ZWC:[id number], [name], NP  Brief Summary: Patient is a 56 y.o.  female with history of bipolar disorder-who was hospitalized at [facility] received a Ativan challenge for possible catatonia-with minimal improvement-due to persistent altered mental status/tachycardia-patient was admitted to [facility] service for further evaluation and treatment.  Significant events: 9/4>> transferred to [facility]-from [facility]-Altered-no improvement after Ativan challenge.  Significant studies: 9/4>> vitamin B12: 290 9/4>> CXR: No obvious pneumonia 9/4>> MRI brain: No acute intracranial abnormality.  Advanced cerebral/cerebellar atrophy 9/4-9/5>> LTM EEG: No seizures. 9/5>> TSH: 1.3 9/5-9/6>> LTM EEG: Negative. 9/6>> CTA chest: No PE-no PNA.  Significant microbiology data: None  Procedures: None   Consults: Neurology, psychiatry  Subjective:  Patient in bed, appears comfortable, denies any headache, no fever, no chest pain or pressure, no shortness of breath , no abdominal pain. No new focal weakness.  Objective: Vitals: Blood pressure (!) 156/91, pulse (!) 135, temperature 98.4 F (36.9 C), temperature source Oral, resp. rate 16, SpO2 94 %.   Exam:  Awake Alert, No new F.N deficits, Normal affect [place].AT,PERRAL Supple Neck, No JVD,   Symmetrical Chest wall movement, Good air movement bilaterally, CTAB RRR,No Gallops, Rubs or new Murmurs,  +ve B.Sounds, Abd Soft, No tenderness,   No Cyanosis, Clubbing or edema   Assessment/Plan:  Acute encephalopathy: Likely delirium in the setting of progressive but undiagnosed dementia (MRI brain showed significant atrophy).  EEG negative for seizures.  Has significant underlying psych issues that could be contributing as well.  No further recommendations from  neurology-unclear baseline but she seems better today.    Elevated D-dimer few days ago, and has improved negative lower extremity venous duplex.  No signs of PE.  Dementia: Significant atrophy on neuroimaging-chronic finding-neurology suspects patient has progressive dementia.  TSH/RPR stable-B12 being supplemented.  Await SNF bed cleared by psych for placement.  Vitamin B12 deficiency: Continue supplementation.  Bipolar disorder: Appreciate psych input-continue Haldol .  Per psych requires SNF/memory care.  Tachycardia upon admission likely due to combination of dehydration and atropine use, atropine discontinued, has been hydrated.  Continue to monitor closely.  As needed IV fluids for dehydration.  On low-dose beta-blocker, TSH was stable. She was getting tachycardic with atropine administration as well which was discontinued and switched to scopolamine for oral drooling.  Repeat blood pressure and heart rate on 01/07/2022.  Tobacco abuse: Continue transdermal nicotine  BMI: Estimated body mass index is 25.62 kg/m as calculated from the following:   Height as of an earlier encounter on 12/24/21: 5\' 6"  (1.676 m).   Weight as of an earlier encounter on 12/24/21: 72 kg.   Code status:   Code Status: Prior   DVT Prophylaxis: enoxaparin (LOVENOX) injection 40 mg Start: 12/26/21 1200    Family Communication: Mother bedside in detail on 12/31/2021.   Disposition Plan: Status is: Inpt The patient will require care spanning > 2 midnights and should be moved to inpatient because: Needs memory care unit-no safe discharge disposition   Planned Discharge Destination: Memory care/SNF-awaiting bed.  Remains medically stable for transfer.  Diet: Diet Order             Diet regular Room service appropriate? No; Fluid consistency:  Thin  Diet effective now                    MEDICATIONS: Scheduled Meds:  atropine  1 drop Sublingual TID   vitamin B-12  1,000 mcg Oral Daily   docusate  sodium  200 mg Oral BID   enoxaparin (LOVENOX) injection  40 mg Subcutaneous Q24H   feeding supplement (KATE FARMS STANDARD 1.4)  325 mL Oral BID BM   haloperidol  1 mg Oral Daily   And   haloperidol  2 mg Oral QHS   hydrocerin   Topical BID   metoprolol tartrate  12.5 mg Oral BID   nicotine  14 mg Transdermal Daily   polyethylene glycol  17 g Oral BID   scopolamine  1 patch Transdermal Q72H   Continuous Infusions: PRN Meds:.acetaminophen, alum & mag hydroxide-simeth, fluticasone, hydrOXYzine, LORazepam, magnesium hydroxide, metoprolol tartrate, OLANZapine zydis **AND** ziprasidone, ondansetron, mouth rinse   I have personally reviewed following labs and imaging studies  LABORATORY DATA:  Recent Labs  Lab 01/02/22 0313 01/04/22 0954  WBC 5.1 4.7  HGB 13.0 13.1  HCT 38.9 39.0  PLT 247 266  MCV 90.0 89.4  MCH 30.1 30.0  MCHC 33.4 33.6  RDW 13.4 13.4  LYMPHSABS  --  1.1  MONOABS  --  0.2  EOSABS  --  0.1  BASOSABS  --  0.0    Recent Labs  Lab 01/02/22 0313 01/04/22 0954  NA 140 139  K 4.3 4.3  CL 108 108  CO2 25 23  GLUCOSE 93 156*  BUN 11 10  CREATININE 1.06* 1.11*  CALCIUM 8.9 9.1  AST 23  --   ALT 21  --   ALKPHOS 50  --   BILITOT 0.5  --   ALBUMIN 3.3*  --   MG 2.2 2.1   Lab Results  Component Value Date   TSH 1.093 12/26/2021    RADIOLOGY STUDIES/RESULTS: No results found.   LOS: 12 days   Signature  Susa Raring M.D on 01/07/2022 at 9:27 AM   -  To page go to [email or web address]

## 2022-01-08 ENCOUNTER — Inpatient Hospital Stay (HOSPITAL_COMMUNITY): Payer: MEDICAID

## 2022-01-08 DIAGNOSIS — I509 Heart failure, unspecified: Secondary | ICD-10-CM

## 2022-01-08 MED ORDER — METOPROLOL TARTRATE 25 MG PO TABS
25.0000 mg | ORAL_TABLET | Freq: Two times a day (BID) | ORAL | Status: DC
  Administered 2022-01-08 – 2022-02-19 (×66): 25 mg via ORAL
  Filled 2022-01-08 (×85): qty 1

## 2022-01-08 NOTE — Plan of Care (Signed)
  Problem: Safety: Goal: Non-violent Restraint(s) Outcome: Progressing   Problem: Activity: Goal: Risk for activity intolerance will decrease Outcome: Progressing

## 2022-01-08 NOTE — Progress Notes (Addendum)
PROGRESS NOTE        PATIENT DETAILS Name: Natalie Brown Age: 56 y.o. Sex: female Date of Birth: [birth year] Admit Date: 12/24/2021 Admitting Physician Evalee Mutton Kristeen Mans, MD NUU:[id number], [name], NP  Brief Summary: Patient is a 56 y.o.  female with history of bipolar disorder-who was hospitalized at [facility] received a Ativan challenge for possible catatonia-with minimal improvement-due to persistent altered mental status/tachycardia-patient was admitted to [facility] service for further evaluation and treatment.  Significant events: 9/4>> transferred to [facility]-from [facility]-Altered-no improvement after Ativan challenge.  Significant studies: 9/4>> vitamin B12: 290 9/4>> CXR: No obvious pneumonia 9/4>> MRI brain: No acute intracranial abnormality.  Advanced cerebral/cerebellar atrophy 9/4-9/5>> LTM EEG: No seizures. 9/5>> TSH: 1.3 9/5-9/6>> LTM EEG: Negative. 9/6>> CTA chest: No PE-no PNA.  Significant microbiology data: None  Procedures: None   Consults: Neurology, psychiatry  Subjective:  Patient in bed, appears comfortable, denies any headache, no fever, no chest pain or pressure, no shortness of breath , no abdominal pain. No new focal weakness.   Objective: Vitals: Blood pressure 97/62, pulse 88, temperature 98.1 F (36.7 C), temperature source Oral, resp. rate 18, SpO2 95 %.   Exam:  Awake, mildly confused, No new F.N deficits,   [place].AT,PERRAL Supple Neck, No JVD,   Symmetrical Chest wall movement, Good air movement bilaterally, CTAB RRR,No Gallops, Rubs or new Murmurs,  +ve B.Sounds, Abd Soft, No tenderness,   No Cyanosis, Clubbing or edema    Assessment/Plan:  Acute encephalopathy: Likely delirium in the setting of progressive but undiagnosed dementia (MRI brain showed significant atrophy).  EEG negative for seizures.  Has significant underlying psych issues that could be contributing as well.  No further recommendations from neurology-unclear  baseline but she seems better today.    Elevated D-dimer - has improved negative lower extremity venous duplex. CTA negative on 12/26/21.  Dementia: Significant atrophy on neuroimaging-chronic finding-neurology suspects patient has progressive dementia.  TSH/RPR stable-B12 being supplemented.  Await SNF bed cleared by psych for placement.  Vitamin B12 deficiency: Continue supplementation.  Bipolar disorder: Appreciate psych input-continue Haldol .  Per psych requires SNF/memory care.  Tachycardia upon admission likely due to combination of dehydration and atropine use, atropine discontinued, has been hydrated.  Continue to monitor closely.  As needed IV fluids for dehydration.  On low-dose beta-blocker, TSH was stable. CTA -ve, DC Atropine.  Her tachycardia  had significantly improved however since yesterday few more episodes of intermittent tachycardia, will obtain echocardiogram to complete work-up.  Tobacco abuse: Continue transdermal nicotine  BMI: Estimated body mass index is 25.62 kg/m as calculated from the following:   Height as of an earlier encounter on 12/24/21: 5\' 6"  (1.676 m).   Weight as of an earlier encounter on 12/24/21: 72 kg.   Code status:   Code Status: Prior   DVT Prophylaxis: enoxaparin (LOVENOX) injection 40 mg Start: 12/26/21 1200    Family Communication: Mother bedside in detail on 12/31/2021.   Disposition Plan: Status is: Inpt The patient will require care spanning > 2 midnights and should be moved to inpatient because: Needs memory care unit-no safe discharge disposition   Planned Discharge Destination: Memory care/SNF-awaiting bed.  Remains medically stable for transfer.  Diet: Diet Order             Diet regular Room service appropriate? No; Fluid consistency: Thin  Diet effective now  MEDICATIONS: Scheduled Meds:  atropine  1 drop Sublingual TID   vitamin B-12  1,000 mcg Oral Daily   docusate sodium  200 mg Oral BID    enoxaparin (LOVENOX) injection  40 mg Subcutaneous Q24H   feeding supplement (KATE FARMS STANDARD 1.4)  325 mL Oral BID BM   haloperidol  1 mg Oral Daily   And   haloperidol  2 mg Oral QHS   hydrocerin   Topical BID   metoprolol tartrate  25 mg Oral BID   nicotine  14 mg Transdermal Daily   polyethylene glycol  17 g Oral BID   scopolamine  1 patch Transdermal Q72H   Continuous Infusions: PRN Meds:.acetaminophen, alum & mag hydroxide-simeth, fluticasone, hydrOXYzine, LORazepam, magnesium hydroxide, metoprolol tartrate, OLANZapine zydis **AND** ziprasidone, ondansetron, mouth rinse   I have personally reviewed following labs and imaging studies  LABORATORY DATA:  Recent Labs  Lab 01/02/22 0313 01/04/22 0954  WBC 5.1 4.7  HGB 13.0 13.1  HCT 38.9 39.0  PLT 247 266  MCV 90.0 89.4  MCH 30.1 30.0  MCHC 33.4 33.6  RDW 13.4 13.4  LYMPHSABS  --  1.1  MONOABS  --  0.2  EOSABS  --  0.1  BASOSABS  --  0.0    Recent Labs  Lab 01/02/22 0313 01/04/22 0954  NA 140 139  K 4.3 4.3  CL 108 108  CO2 25 23  GLUCOSE 93 156*  BUN 11 10  CREATININE 1.06* 1.11*  CALCIUM 8.9 9.1  AST 23  --   ALT 21  --   ALKPHOS 50  --   BILITOT 0.5  --   ALBUMIN 3.3*  --   MG 2.2 2.1   Lab Results  Component Value Date   TSH 1.093 12/26/2021    RADIOLOGY STUDIES/RESULTS: DG Abd Portable 1V  Result Date: 01/07/2022 CLINICAL DATA:  Constipation EXAM: PORTABLE ABDOMEN - 1 VIEW COMPARISON:  01/03/2022 FINDINGS: Nonobstructive bowel gas pattern. No abnormal colonic stool burden. Punctate 2-3 mm calcification again seen projecting over the right renal shadow. No acute bony findings. IMPRESSION: 1. Nonobstructive bowel gas pattern. 2. Punctate 2-3 mm calcification projecting over the right renal shadow. Electronically Signed   By: Duanne Guess D.O.   On: 01/07/2022 10:22   DG Chest Port 1 View  Result Date: 01/07/2022 CLINICAL DATA:  Shortness of breath EXAM: PORTABLE CHEST 1 VIEW COMPARISON:   12/24/2021 FINDINGS: The heart size and mediastinal contours are within normal limits. Slightly low lung volumes. No focal airspace consolidation, pleural effusion, or pneumothorax. The visualized skeletal structures are unremarkable. IMPRESSION: No active disease. Electronically Signed   By: Duanne Guess D.O.   On: 01/07/2022 10:21     LOS: 13 days   Signature  Susa Raring M.D on 01/08/2022 at 9:59 AM   -  To page go to [email or web address]

## 2022-01-08 NOTE — TOC Progression Note (Addendum)
Transition of Care [facility]) - Progression Note    Patient Details  Name: Natalie Brown MRN: [id number] Date of Birth: Jun 28, 1965  Transition of Care [facility]) CM/SW [place], LCSW Phone Number: 01/08/2022, 1:26 PM  Clinical Narrative:    -CSW spoke with Devin Going [phone]) who reported she may have something in three weeks if one resident does not end up staying. She will contact CSW at that time if that is the case.  -CSW contacted Melida Quitter [phone]) but voicemail full.   Per billing, patient's Medicaid is now active.      Barriers to Discharge: Financial Resources, Inadequate or no insurance, Homeless with medical needs  Expected Discharge Plan and Services   In-house Referral: Clinical Social Work   Post Acute Care Choice: Nursing Home Living arrangements for the past 2 months: Homeless [facility]) Expected Discharge Date: 12/24/21                                     Social Determinants of Health (SDOH) Interventions    Readmission Risk Interventions     No data to display

## 2022-01-09 DIAGNOSIS — G9340 Encephalopathy, unspecified: Secondary | ICD-10-CM

## 2022-01-09 LAB — ECHOCARDIOGRAM COMPLETE
AR max vel: 2.52 cm2
AV Area VTI: 2.29 cm2
AV Area mean vel: 2.26 cm2
AV Mean grad: 4 mmHg
AV Peak grad: 7.2 mmHg
Ao pk vel: 1.34 m/s
Area-P 1/2: 3.21 cm2
Height: 66 in
MV VTI: 3.03 cm2
S' Lateral: 2.7 cm
Weight: 2539.7 oz

## 2022-01-09 LAB — COMPREHENSIVE METABOLIC PANEL
ALT: 21 U/L (ref 0–44)
AST: 26 U/L (ref 15–41)
Albumin: 3.2 g/dL — ABNORMAL LOW (ref 3.5–5.0)
Alkaline Phosphatase: 48 U/L (ref 38–126)
Anion gap: 13 (ref 5–15)
BUN: 15 mg/dL (ref 6–20)
CO2: 26 mmol/L (ref 22–32)
Calcium: 9.5 mg/dL (ref 8.9–10.3)
Chloride: 102 mmol/L (ref 98–111)
Creatinine, Ser: 1.23 mg/dL — ABNORMAL HIGH (ref 0.44–1.00)
GFR, Estimated: 52 mL/min — ABNORMAL LOW (ref >60)
Glucose, Bld: 87 mg/dL (ref 70–99)
Potassium: 4.6 mmol/L (ref 3.5–5.1)
Sodium: 141 mmol/L (ref 135–145)
Total Bilirubin: 0.7 mg/dL (ref 0.3–1.2)
Total Protein: 6.6 g/dL (ref 6.5–8.1)

## 2022-01-09 LAB — CBC
HCT: 38.5 % (ref 36.0–46.0)
Hemoglobin: 12.6 g/dL (ref 12.0–15.0)
MCH: 29.8 pg (ref 26.0–34.0)
MCHC: 32.7 g/dL (ref 30.0–36.0)
MCV: 91 fL (ref 80.0–100.0)
Platelets: 271 10*3/uL (ref 150–400)
RBC: 4.23 MIL/uL (ref 3.87–5.11)
RDW: 13.3 % (ref 11.5–15.5)
WBC: 6 10*3/uL (ref 4.0–10.5)
nRBC: 0 % (ref 0.0–0.2)

## 2022-01-09 LAB — MAGNESIUM: Magnesium: 2.3 mg/dL (ref 1.7–2.4)

## 2022-01-09 LAB — TSH: TSH: 1.468 u[IU]/mL (ref 0.350–4.500)

## 2022-01-09 NOTE — Progress Notes (Signed)
Physical Therapy Treatment Patient Details Name: Natalie Brown MRN: [id number] DOB: Jan 25, 1966 Today's Date: 01/09/2022   History of Present Illness Patient is a 56 y.o.  female with history of bipolar disorder-who was hospitalized at [facility] received a Ativan challenge for possible catatonia-with minimal improvement-due to persistent altered mental status/tachycardia-patient was admitted to [facility] service for further evaluation and treatment.    PT Comments    Pt received in supine but getting OOB unassisted, pt oriented to self and agreeable to therapy session with emphasis on strengthening BLE and wayfinding to locate room during gait progression. Pt needing intermittent HHA for stability (pt with mild loss of balance while dual tasking or distracted). Attempted to have pt perform stair training for BLE strengthening but she defers, and unable to be redirected due to cognitive deficit. Pt requesting return to supine at end of session although encouraged to sit up in chair. Pt continues to benefit from PT services to progress toward functional mobility goals.   Recommendations for follow up therapy are one component of a multi-disciplinary discharge planning process, led by the attending physician.  Recommendations may be updated based on patient status, additional functional criteria and insurance authorization.  Follow Up Recommendations  No PT follow up     Assistance Recommended at Discharge Intermittent Supervision/Assistance  Patient can return home with the following Direct supervision/assist for medications management;Direct supervision/assist for financial management;Assist for transportation   Equipment Recommendations  None recommended by PT    Recommendations for Other Services       Precautions / Restrictions Precautions Precautions: Fall Precaution Comments: impulsive (sitter discontinued) Restrictions Weight Bearing Restrictions: No     Mobility  Bed  Mobility Overal bed mobility: Needs Assistance Bed Mobility: Supine to Sit, Sit to Supine     Supine to sit: Supervision Sit to supine: HOB elevated, Min assist   General bed mobility comments: Increased time for supine to sit and cues for sequence, pt needing minA to raise legs back onto bed with return to supine, pt attempted but unable to complete unassisted likely due to cognitive deficit.    Transfers Overall transfer level: Needs assistance Equipment used: None Transfers: Sit to/from Stand Sit to Stand: Min guard           General transfer comment: minG for safety    Ambulation/Gait Ambulation/Gait assistance: Min assist, Min guard Gait Distance (Feet): 350 Feet Assistive device: None, 1 person hand held assist Gait Pattern/deviations: Step-through pattern, Drifts right/left Gait velocity: reduced     General Gait Details: intermittent minA with lateral LOB, pt with stiff/slow low strides and narrow BOS   Stairs Stairs:  (encouraged pt to attempt for BLE strengthening and balance progression but pt refusing to attempt)              Balance Overall balance assessment: Needs assistance Sitting-balance support: No upper extremity supported, Feet supported Sitting balance-Leahy Scale: Good     Standing balance support: During functional activity, No upper extremity supported Standing balance-Leahy Scale: Poor Standing balance comment: static standing fair, dynamic standing poor                            Cognition Arousal/Alertness: Awake/alert Behavior During Therapy: Flat affect Overall Cognitive Status: No family/caregiver present to determine baseline cognitive functioning Area of Impairment: Orientation, Attention, Memory, Following commands, Safety/judgement, Awareness, Problem solving                 Orientation  Level: Disoriented to, Time, Situation Current Attention Level: Focused Memory: Decreased recall of precautions,  Decreased short-term memory Following Commands: Follows one step commands inconsistently, Follows multi-step commands inconsistently Safety/Judgement: Decreased awareness of safety, Decreased awareness of deficits Awareness: Intellectual Problem Solving: Slow processing, Difficulty sequencing, Requires verbal cues General Comments: Pt able to state name and DOB, not answering other orientation questions. Pt answering mostly simple yes/no questions appropriately. Slow processing and often needs dense cues for safer body mechanics and wayfinding/navigation.        Exercises      General Comments General comments (skin integrity, edema, etc.): SpO2 92% on RA, HR 75-91 bpm with exertion      Pertinent Vitals/Pain Pain Assessment Pain Assessment: No/denies pain Faces Pain Scale: No hurt Breathing: normal Negative Vocalization: none Facial Expression: smiling or inexpressive Body Language: relaxed Consolability: no need to console PAINAD Score: 0 Pain Intervention(s): Monitored during session, Repositioned           PT Goals (current goals can now be found in the care plan section) Acute Rehab PT Goals Patient Stated Goal: to return to independent PT Goal Formulation: With patient Time For Goal Achievement: 01/16/22 Progress towards PT goals: Progressing toward goals    Frequency    Min 3X/week      PT Plan Current plan remains appropriate       AM-PAC PT "6 Clicks" Mobility   Outcome Measure  Help needed turning from your back to your side while in a flat bed without using bedrails?: None Help needed moving from lying on your back to sitting on the side of a flat bed without using bedrails?: A Little Help needed moving to and from a bed to a chair (including a wheelchair)?: A Little Help needed standing up from a chair using your arms (e.g., wheelchair or bedside chair)?: A Little Help needed to walk in hospital room?: A Little Help needed climbing 3-5 steps with  a railing? : A Lot 6 Click Score: 18    End of Session Equipment Utilized During Treatment: Gait belt Activity Tolerance: Patient tolerated treatment well Patient left: in bed;with call bell/phone within reach;with nursing/sitter in room;Other (comment) (no sitter in room, MD notified that pt was getting up unassisted when PTA entered room and remains unsteady) Nurse Communication: Mobility status;Other (comment) (may benefit from in person or remote safety sitter, pt confused and needs stability assist for mobility and fall risk prevention) PT Visit Diagnosis: Other abnormalities of gait and mobility (R26.89)     Time: 6226-3335 PT Time Calculation (min) (ACUTE ONLY): 12 min  Charges:  $Therapeutic Exercise: 8-22 mins                     [place] P., PTA Acute Rehabilitation Services Secure Chat Preferred 9a-5:30pm Office: [phone]    [place] 01/09/2022, 5:18 PM

## 2022-01-09 NOTE — TOC Progression Note (Signed)
Transition of Care [facility]) - Progression Note    Patient Details  Name: MELVINIA ASHBY MRN: [id number] Date of Birth: 1966/02/05  Transition of Care [facility]) CM/SW [place], LCSW Phone Number: 01/09/2022, 9:28 AM  Clinical Narrative:    CSW spoke with patient's mother and let her know that per billing, patient's Medicaid is now active/listed.      Barriers to Discharge: Financial Resources, Inadequate or no insurance, Homeless with medical needs  Expected Discharge Plan and Services   In-house Referral: Clinical Social Work   Post Acute Care Choice: Nursing Home Living arrangements for the past 2 months: Homeless [facility]) Expected Discharge Date: 12/24/21                                     Social Determinants of Health (SDOH) Interventions    Readmission Risk Interventions     No data to display

## 2022-01-09 NOTE — Progress Notes (Signed)
Mobility Specialist Progress Note:   01/09/22 0920  Mobility  Activity Ambulated with assistance in hallway  Level of Assistance Minimal assist, patient does 75% or more  Assistive Device None  Distance Ambulated (ft) 300 ft  Activity Response Tolerated well  $Mobility charge 1 Mobility   Pt agreeable to mobility session. Required minA at times to steady. Pt back in bed, thankful for mobility session. Chair alarm on.   Nelta Numbers Acute Rehab Secure Chat or Office Phone: [phone]

## 2022-01-09 NOTE — Progress Notes (Signed)
PROGRESS NOTE        PATIENT DETAILS Name: Natalie Brown Age: 56 y.o. Sex: female Date of Birth: [birth year] Admit Date: 12/24/2021 Admitting Physician Dewayne ShorterShanker Levora DredgeM Ghimire, MD WRU:EAVWUJPCP:[name], Chales AbrahamsMary Ann, NP  Brief Summary: Patient is a 56 y.o.  female with history of bipolar disorder-who [facility]-and received a Ativan challenge for possible catatonia-with minimal improvement-due to persistent altered mental status/tachycardia-[facility] and treatment.  Significant events: 9/4>> transferred to [facility]-from [facility]-Altered-no improvement after Ativan challenge.  Significant studies: 9/4>> vitamin B12: 290 9/4>> CXR: No obvious pneumonia 9/4>> MRI brain: No acute intracranial abnormality.  Advanced cerebral/cerebellar atrophy 9/4-9/5>> LTM EEG: No seizures. 9/5>> TSH: 1.3 9/5-9/6>> LTM EEG: Negative. 9/6>> CTA chest: No PE-no PNA.  Significant microbiology data: None  Procedures: None   Consults: Neurology, psychiatry  Subjective:    No significant events overnight, patient appears comfortable, she denies any complaints, no dizziness, s   Objective: Vitals: Blood pressure 125/67, pulse 70, temperature 98.4 F (36.9 C), temperature source Oral, resp. rate 16, SpO2 95 %.   Exam:  Awake Alert, mildly confused and incoherent Symmetrical Chest wall movement, Good air movement bilaterally, CTAB RRR,No Gallops,Rubs or new Murmurs, No Parasternal Heave +ve B.Sounds, Abd Soft, No tenderness, No rebound - guarding or rigidity. No Cyanosis, Clubbing or edema, No new Rash or bruise     Assessment/Plan:  Acute encephalopathy: Likely delirium in the setting of progressive but undiagnosed dementia (MRI brain showed significant atrophy).  EEG negative for seizures.  Has significant underlying psych issues that could be contributing as well.  No further recommendations from neurology-unclear baseline but she  seems better today.    Elevated D-dimer - has improved negative lower extremity venous duplex. CTA negative on 12/26/21.  Dementia: Significant atrophy on neuroimaging-chronic finding-neurology suspects patient has progressive dementia.  TSH/RPR stable-B12 being supplemented.  Await SNF bed cleared by psych for placement.  Vitamin B12 deficiency: Continue supplementation.  Bipolar disorder: Appreciate psych input-continue Haldol .  Per psych requires SNF/memory care.  Tachycardia upon admission likely due to combination of dehydration and atropine use, atropine discontinued, has been hydrated.  Continue to monitor closely.  As needed IV fluids for dehydration.  On low-dose beta-blocker, TSH was stable. CTA -ve, DC Atropine.  Her tachycardia  had significantly improved however since yesterday few more episodes of intermittent tachycardia, will obtain echocardiogram to complete work-up.  Tobacco abuse: Continue transdermal nicotine  BMI: Estimated body mass index is 25.62 kg/m as calculated from the following:   Height as of an earlier encounter on 12/24/21: 5\' 6"  (1.676 m).   Weight as of an earlier encounter on 12/24/21: 72 kg.   Code status:   Code Status: Prior   DVT Prophylaxis: enoxaparin (LOVENOX) injection 40 mg Start: 12/26/21 1200    Family Communication: None at bedside   Disposition Plan: Status is: Inpt The patient will require care spanning > 2 midnights and should be moved to inpatient because: Needs memory care unit-no safe discharge disposition   Planned Discharge Destination: Memory care/SNF-awaiting bed.  Remains medically stable for transfer.  Diet: Diet Order             Diet regular Room service appropriate? No; Fluid consistency: Thin  Diet effective now  MEDICATIONS: Scheduled Meds:  atropine  1 drop Sublingual TID   vitamin B-12  1,000 mcg Oral Daily   docusate sodium  200 mg Oral BID   enoxaparin (LOVENOX) injection  40 mg  Subcutaneous Q24H   feeding supplement (KATE FARMS STANDARD 1.4)  325 mL Oral BID BM   haloperidol  1 mg Oral Daily   And   haloperidol  2 mg Oral QHS   hydrocerin   Topical BID   metoprolol tartrate  25 mg Oral BID   nicotine  14 mg Transdermal Daily   polyethylene glycol  17 g Oral BID   scopolamine  1 patch Transdermal Q72H   Continuous Infusions: PRN Meds:.acetaminophen, alum & mag hydroxide-simeth, fluticasone, hydrOXYzine, LORazepam, magnesium hydroxide, metoprolol tartrate, OLANZapine zydis **AND** ziprasidone, ondansetron, mouth rinse   I have personally reviewed following labs and imaging studies  LABORATORY DATA:  Recent Labs  Lab 01/04/22 0954 01/09/22 0635  WBC 4.7 6.0  HGB 13.1 12.6  HCT 39.0 38.5  PLT 266 271  MCV 89.4 91.0  MCH 30.0 29.8  MCHC 33.6 32.7  RDW 13.4 13.3  LYMPHSABS 1.1  --   MONOABS 0.2  --   EOSABS 0.1  --   BASOSABS 0.0  --     Recent Labs  Lab 01/04/22 0954 01/09/22 0635  NA 139 141  K 4.3 4.6  CL 108 102  CO2 23 26  GLUCOSE 156* 87  BUN 10 15  CREATININE 1.11* 1.23*  CALCIUM 9.1 9.5  AST  --  26  ALT  --  21  ALKPHOS  --  48  BILITOT  --  0.7  ALBUMIN  --  3.2*  MG 2.1 2.3  TSH  --  1.468   Lab Results  Component Value Date   TSH 1.468 01/09/2022    RADIOLOGY STUDIES/RESULTS: ECHOCARDIOGRAM COMPLETE  Result Date: 01/09/2022    ECHOCARDIOGRAM REPORT   Patient Name:   AMBERLI RUEGG Date of Exam: 01/08/2022 Medical Rec #:  [id number]        Height:       66.0 in Accession #:    [id number]       Weight:       158.7 lb Date of Birth:  02-13-1966        BSA:          1.813 m Patient Age:    37 years         BP:           103/68 mmHg Patient Gender: F                HR:           87 bpm. Exam Location:  Inpatient Procedure: 2D Echo, Cardiac Doppler and Color Doppler Indications:    CHF  History:        Patient has no prior history of Echocardiogram examinations.                 Arrythmias:Tachycardia, Signs/Symptoms:Altered  Mental Status;                 Risk Factors:Current Smoker.  Sonographer:    Wenda Low Referring Phys: Benoit Benton  1. Left ventricular ejection fraction, by estimation, is 60 to 65%. The left ventricle has normal function. The left ventricle has no regional wall motion abnormalities. Left ventricular diastolic parameters are consistent with Grade I diastolic dysfunction (impaired relaxation).  2. Right ventricular systolic function  is normal. The right ventricular size is normal.  3. The mitral valve is normal in structure. Trivial mitral valve regurgitation. No evidence of mitral stenosis.  4. The aortic valve is tricuspid. Aortic valve regurgitation is not visualized. No aortic stenosis is present.  5. The inferior vena cava is normal in size with greater than 50% respiratory variability, suggesting right atrial pressure of 3 mmHg. FINDINGS  Left Ventricle: Left ventricular ejection fraction, by estimation, is 60 to 65%. The left ventricle has normal function. The left ventricle has no regional wall motion abnormalities. The left ventricular internal cavity size was normal in size. There is  no left ventricular hypertrophy. Left ventricular diastolic parameters are consistent with Grade I diastolic dysfunction (impaired relaxation). Right Ventricle: The right ventricular size is normal. No increase in right ventricular wall thickness. Right ventricular systolic function is normal. Left Atrium: Left atrial size was normal in size. Right Atrium: Right atrial size was normal in size. Pericardium: There is no evidence of pericardial effusion. Mitral Valve: The mitral valve is normal in structure. Trivial mitral valve regurgitation. No evidence of mitral valve stenosis. MV peak gradient, 2.5 mmHg. The mean mitral valve gradient is 1.0 mmHg. Tricuspid Valve: The tricuspid valve is normal in structure. Tricuspid valve regurgitation is not demonstrated. No evidence of tricuspid stenosis.  Aortic Valve: The aortic valve is tricuspid. Aortic valve regurgitation is not visualized. No aortic stenosis is present. Aortic valve mean gradient measures 4.0 mmHg. Aortic valve peak gradient measures 7.2 mmHg. Aortic valve area, by VTI measures 2.29 cm. Pulmonic Valve: The pulmonic valve was normal in structure. Pulmonic valve regurgitation is not visualized. No evidence of pulmonic stenosis. Aorta: The aortic root is normal in size and structure. Venous: The inferior vena cava is normal in size with greater than 50% respiratory variability, suggesting right atrial pressure of 3 mmHg. IAS/Shunts: No atrial level shunt detected by color flow Doppler.  LEFT VENTRICLE PLAX 2D LVIDd:         3.70 cm   Diastology LVIDs:         2.70 cm   LV e' medial:    7.62 cm/s LV PW:         0.70 cm   LV E/e' medial:  7.8 LV IVS:        1.00 cm   LV e' lateral:   11.70 cm/s LVOT diam:     1.90 cm   LV E/e' lateral: 5.1 LV SV:         64 LV SV Index:   35 LVOT Area:     2.84 cm  RIGHT VENTRICLE RV Basal diam:  2.90 cm RV Mid diam:    2.80 cm RV S prime:     11.10 cm/s TAPSE (M-mode): 2.8 cm LEFT ATRIUM             Index        RIGHT ATRIUM           Index LA diam:        3.00 cm 1.65 cm/m   RA Area:     13.40 cm LA Vol (A2C):   41.1 ml 22.67 ml/m  RA Volume:   34.20 ml  18.87 ml/m LA Vol (A4C):   27.9 ml 15.39 ml/m LA Biplane Vol: 34.7 ml 19.14 ml/m  AORTIC VALVE                    PULMONIC VALVE AV Area (Vmax):    2.52  cm     PV Vmax:       0.99 m/s AV Area (Vmean):   2.26 cm     PV Peak grad:  3.9 mmHg AV Area (VTI):     2.29 cm AV Vmax:           134.00 cm/s AV Vmean:          94.300 cm/s AV VTI:            0.278 m AV Peak Grad:      7.2 mmHg AV Mean Grad:      4.0 mmHg LVOT Vmax:         119.00 cm/s LVOT Vmean:        75.100 cm/s LVOT VTI:          0.225 m LVOT/AV VTI ratio: 0.81  AORTA Ao Root diam: 2.60 cm MITRAL VALVE MV Area (PHT): 3.21 cm    SHUNTS MV Area VTI:   3.03 cm    Systemic VTI:  0.22 m MV Peak grad:   2.5 mmHg    Systemic Diam: 1.90 cm MV Mean grad:  1.0 mmHg MV Vmax:       0.78 m/s MV Vmean:      47.1 cm/s MV Decel Time: 236 msec MV E velocity: 59.10 cm/s MV A velocity: 81.00 cm/s MV E/A ratio:  0.73 Donato Schultz MD Electronically signed by Donato Schultz MD Signature Date/Time: 01/09/2022/6:24:32 AM    Final      LOS: 14 days   Signature  Huey Bienenstock M.D on 01/09/2022 at 3:55 PM   -  To page go to [email or web address]

## 2022-01-10 DIAGNOSIS — F312 Bipolar disorder, current episode manic severe with psychotic features: Secondary | ICD-10-CM

## 2022-01-10 NOTE — Progress Notes (Signed)
Pt continues to exit bed to go to restroom without using call bell. Pt gait is unsteady and educated on the importance of using call bell for assistance to prevent falls. Junie Panning, Web designer, Doctor, hospital made aware of situation.

## 2022-01-10 NOTE — Progress Notes (Signed)
Occupational Therapy Treatment Patient Details Name: Natalie Brown MRN: [id number] DOB: [birth year] Today's Date: 01/10/2022   History of present illness Patient is a 56 y.o.  female with history of bipolar disorder-who was [facility] at [facility] received a Ativan challenge for possible catatonia-with minimal improvement-due to persistent altered mental status/tachycardia-patient was admitted to [facility] service for further evaluation and treatment.   OT comments  Pt progressing towards established OT goals. Donning socks sitting EOB with supervision. Able to follow 2 step commands in given at beginning of session on OT arrival, but otherwise requiring mod-max cues to follow commands. Performing two step trail making with min cues for problem solving. Pt refusing hygiene tasks this date. Able to verbalize order sheets/blankets should be placed to cover back up after session, and nicely request from therapist. Pt becoming frustrated with attempt to use television at end of session, and following ~25% of commands to control television. Not attempting to read buttons on remote, and requiring visual cues to navigate. Continue to recommend discharge home with OP OT to optimize safety and independence with ADL and IADL.    Recommendations for follow up therapy are one component of a multi-disciplinary discharge planning process, led by the attending physician.  Recommendations may be updated based on patient status, additional functional criteria and insurance authorization.    Follow Up Recommendations  Outpatient OT    Assistance Recommended at Discharge Frequent or constant Supervision/Assistance  Patient can return home with the following  A little help with bathing/dressing/bathroom;Assistance with cooking/housework;Direct supervision/assist for medications management;Direct supervision/assist for financial management;Assist for transportation;A little help with walking and/or transfers;Help with  stairs or ramp for entrance   Equipment Recommendations  None recommended by OT    Recommendations for Other Services Speech consult    Precautions / Restrictions Precautions Precautions: Fall Precaution Comments: impulsive (sitter discontinued) Restrictions Weight Bearing Restrictions: No       Mobility Bed Mobility Overal bed mobility: Needs Assistance Bed Mobility: Supine to Sit, Sit to Supine     Supine to sit: Supervision Sit to supine: HOB elevated, Min assist   General bed mobility comments: Increased time, and min A to bring legs into bed    Transfers Overall transfer level: Needs assistance Equipment used: 1 person hand held assist, None Transfers: Sit to/from Stand Sit to Stand: Min guard           General transfer comment: minG for safety     Balance Overall balance assessment: Needs assistance Sitting-balance support: No upper extremity supported, Feet supported Sitting balance-Leahy Scale: Good Sitting balance - Comments: Pt with decresed awareness of being EOB   Standing balance support: During functional activity, No upper extremity supported Standing balance-Leahy Scale: Poor Standing balance comment: static standing fair, dynamic standing poor                           ADL either performed or assessed with clinical judgement   ADL Overall ADL's : Needs assistance/impaired                     Lower Body Dressing: Supervision/safety;Sitting/lateral leans Lower Body Dressing Details (indicate cue type and reason): donning socks Toilet Transfer: Min Pension scheme manager Details (indicate cue type and reason): simulated Toileting- Clothing Manipulation and Hygiene: Supervision/safety;Sitting/lateral lean       Functional mobility during ADLs: Min guard General ADL Comments: min guard for safety with all mobility. Pt with preference for hand held  assist. two step path finding. Requiring cues for attention to task.     Extremity/Trunk Assessment Upper Extremity Assessment Upper Extremity Assessment: Overall WFL for tasks assessed   Lower Extremity Assessment Lower Extremity Assessment: Defer to PT evaluation        Vision   Vision Assessment?: Vision impaired- to be further tested in functional context Additional Comments: Pt requiring assist to locate specific channel on TV. perhaps decresed acuity due to unable to locate different buttons following verbal cues. Also decresaed attention.   Perception     Praxis      Cognition Arousal/Alertness: Awake/alert Behavior During Therapy: Flat affect Overall Cognitive Status: No family/caregiver present to determine baseline cognitive functioning Area of Impairment: Orientation, Attention, Memory, Following commands, Safety/judgement, Awareness, Problem solving                 Orientation Level: Disoriented to, Time Current Attention Level: Focused, Sustained Memory: Decreased recall of precautions, Decreased short-term memory Following Commands: Follows one step commands inconsistently, Follows multi-step commands inconsistently, Follows one step commands with increased time Safety/Judgement: Decreased awareness of safety, Decreased awareness of deficits Awareness: Intellectual Problem Solving: Slow processing, Difficulty sequencing, Requires verbal cues          Exercises      Shoulder Instructions       General Comments VSS. Pt with greater verbalizations this date amnd reporting she enjoyed taking walk with therapist. Continues with overall flat affect. Decresed frustration tolerance with attempt to navigate television. Noted increased breath rate tension in shoulders    Pertinent Vitals/ Pain       Pain Assessment Pain Assessment: No/denies pain Pain Intervention(s): Monitored during session  Home Living                                          Prior Functioning/Environment              Frequency   Min 2X/week        Progress Toward Goals  OT Goals(current goals can now be found in the care plan section)  Progress towards OT goals: Progressing toward goals  Acute Rehab OT Goals Patient Stated Goal: Wants to go "to behavior center" OT Goal Formulation: With patient Time For Goal Achievement: 01/24/22 Potential to Achieve Goals: Good ADL Goals Pt Will Perform Grooming: Independently;standing Pt Will Perform Upper Body Dressing: Independently;sitting Pt Will Perform Lower Body Dressing: Independently;sit to/from stand Additional ADL Goal #1: Pt will indep complete IADL medication management task Additional ADL Goal #2: pt will follow 2-3 step commands 50% of the session to complete functional tasks  Plan Discharge plan remains appropriate;Frequency remains appropriate    Co-evaluation                 AM-PAC OT "6 Clicks" Daily Activity     Outcome Measure   Help from another person eating meals?: A Little Help from another person taking care of personal grooming?: A Little Help from another person toileting, which includes using toliet, bedpan, or urinal?: A Little Help from another person bathing (including washing, rinsing, drying)?: A Little Help from another person to put on and taking off regular upper body clothing?: A Little Help from another person to put on and taking off regular lower body clothing?: A Little 6 Click Score: 18    End of Session Equipment Utilized During Treatment: Gait belt  OT  Visit Diagnosis: Muscle weakness (generalized) (M62.81);History of falling (Z91.81);Other symptoms and signs involving cognitive function   Activity Tolerance Patient tolerated treatment well   Patient Left in bed;with call bell/phone within reach;with bed alarm set   Nurse Communication Mobility status        Time: 9233-0076 OT Time Calculation (min): 21 min  Charges: OT General Charges $OT Visit: 1 Visit OT Treatments $Therapeutic Activity: 8-22  mins  Natalie Brown, Natalie Brown [facility] Acute Rehabilitation Office: [phone]   Natalie Brown 01/10/2022, 4:26 PM

## 2022-01-10 NOTE — Progress Notes (Addendum)
Mobility Specialist Progress Note:   01/10/22 0950  Mobility  Activity Ambulated with assistance in hallway  Level of Assistance Minimal assist, patient does 75% or more  Assistive Device Other (Comment) (HHA)  Distance Ambulated (ft) 350 ft  Activity Response Tolerated well  $Mobility charge 1 Mobility   Pt eager for mobility session, stating "I want to walk with you!"  when this writer entered room. Required minA to steady multiple times throughout ambulation. Pt back in bed with all needs met, bed alarm on.   Nelta Numbers Acute Rehab Secure Chat or Office Phone: [phone]

## 2022-01-10 NOTE — TOC Progression Note (Addendum)
Transition of Care [facility]) - Progression Note    Patient Details  Name: CADIENCE BRADFIELD MRN: [id number] Date of Birth: September 23, 1965  Transition of Care [facility]) CM/SW [place], LCSW Phone Number: 01/10/2022, 1:26 PM  Clinical Narrative:    CSW left voicemail for Anitha with [facility].  CSW left voicemail for [facility].   CSW spoke with [employer], [place], and [facility]. They stated they would need patient to be completely ambulatory and steady on her feet.   Anitha returned CSW's call. CSW faxed her referral for review.        Barriers to Discharge: Financial Resources, Inadequate or no insurance, Homeless with medical needs  Expected Discharge Plan and Services   In-house Referral: Clinical Social Work   Post Acute Care Choice: Nursing Home Living arrangements for the past 2 months: Homeless [facility]) Expected Discharge Date: 12/24/21                                     Social Determinants of Health (SDOH) Interventions    Readmission Risk Interventions     No data to display

## 2022-01-10 NOTE — Progress Notes (Signed)
PROGRESS NOTE        PATIENT DETAILS Name: Natalie Brown Age: 56 y.o. Sex: female Date of Birth: [birth year] Admit Date: 12/24/2021 Admitting Physician Evalee Mutton Kristeen Mans, MD [id number], [name], NP  Brief Summary:  Patient is a 56 y.o.  female with history of bipolar disorder-who was hospitalized at [facility] received a Ativan challenge for possible catatonia-with minimal improvement-due to persistent altered mental status/tachycardia-patient was admitted to [facility] service for further evaluation and treatment.  Significant events: 9/4>> transferred to [facility]-from [facility]-Altered-no improvement after Ativan challenge.  Significant studies: 9/4>> vitamin B12: 290 9/4>> CXR: No obvious pneumonia 9/4>> MRI brain: No acute intracranial abnormality.  Advanced cerebral/cerebellar atrophy 9/4-9/5>> LTM EEG: No seizures. 9/5>> TSH: 1.3 9/5-9/6>> LTM EEG: Negative. 9/6>> CTA chest: No PE-no PNA.  Significant microbiology data: None  Procedures: None   Consults: Neurology, psychiatry  Subjective:    No significant events overnight, patient appears comfortable, she denies any complaints, no dizziness,    Objective: Vitals: Blood pressure 107/61, pulse 69, temperature 98.1 F (36.7 C), temperature source Oral, resp. rate 17, SpO2 95 %.   Exam:  Awake Alert, mildly confused and incoherent Symmetrical Chest wall movement, Good air movement bilaterally, CTAB RRR,No Gallops,Rubs or new Murmurs, No Parasternal Heave +ve B.Sounds, Abd Soft, No tenderness, No rebound - guarding or rigidity. No Cyanosis, Clubbing or edema, No new Rash or bruise      Assessment/Plan:  Acute encephalopathy:  -  Likely delirium in the setting of progressive but undiagnosed dementia (MRI brain showed significant atrophy).  EEG negative for seizures.  Has significant underlying psych issues that could be contributing as well.  No further recommendations from neurology-unclear baseline  but she seems better today.    Elevated D-dimer  - has improved negative lower extremity venous duplex. CTA negative on 12/26/21.  Dementia:  Significant atrophy on neuroimaging-chronic finding-neurology suspects patient has progressive dementia.  TSH/RPR stable-B12 being supplemented.  Await SNF bed cleared by psych for placement.  Vitamin B12 deficiency:  - Continue supplementation.  Bipolar disorder:  - Appreciate psych input-continue Haldol .  Per psych requires SNF/memory care.  Tachycardia upon admission likely due to combination of dehydration and atropine use, atropine discontinued, has been hydrated.   -Continue to monitor closely.  As needed IV fluids for dehydration.  On low-dose beta-blocker, TSH was stable. CTA -ve, DC Atropine.  -She remains with intermittent episodes of sinus tachycardia, symptomatic. -2D echo was obtained, no acute findings.   Tobacco abuse: Continue transdermal nicotine  BMI: Estimated body mass index is 25.62 kg/m as calculated from the following:   Height as of an earlier encounter on 12/24/21: 5\' 6"  (1.676 m).   Weight as of an earlier encounter on 12/24/21: 72 kg.   Code status:   Code Status: Prior   DVT Prophylaxis: enoxaparin (LOVENOX) injection 40 mg Start: 12/26/21 1200    Family Communication: None at bedside   Disposition Plan: Status is: Inpt The patient will require care spanning > 2 midnights and should be moved to inpatient because: Needs memory care unit-no safe discharge disposition   Planned Discharge Destination: Memory care/SNF-awaiting bed.  Remains medically stable for transfer.  Diet: Diet Order             Diet regular Room service appropriate? No; Fluid consistency: Thin  Diet effective now  MEDICATIONS: Scheduled Meds:  atropine  1 drop Sublingual TID   vitamin B-12  1,000 mcg Oral Daily   docusate sodium  200 mg Oral BID   enoxaparin (LOVENOX) injection  40 mg Subcutaneous Q24H    feeding supplement (KATE FARMS STANDARD 1.4)  325 mL Oral BID BM   haloperidol  1 mg Oral Daily   And   haloperidol  2 mg Oral QHS   hydrocerin   Topical BID   metoprolol tartrate  25 mg Oral BID   nicotine  14 mg Transdermal Daily   polyethylene glycol  17 g Oral BID   scopolamine  1 patch Transdermal Q72H   Continuous Infusions: PRN Meds:.acetaminophen, alum & mag hydroxide-simeth, fluticasone, hydrOXYzine, LORazepam, magnesium hydroxide, metoprolol tartrate, OLANZapine zydis **AND** ziprasidone, ondansetron, mouth rinse   I have personally reviewed following labs and imaging studies  LABORATORY DATA:  Recent Labs  Lab 01/04/22 0954 01/09/22 0635  WBC 4.7 6.0  HGB 13.1 12.6  HCT 39.0 38.5  PLT 266 271  MCV 89.4 91.0  MCH 30.0 29.8  MCHC 33.6 32.7  RDW 13.4 13.3  LYMPHSABS 1.1  --   MONOABS 0.2  --   EOSABS 0.1  --   BASOSABS 0.0  --     Recent Labs  Lab 01/04/22 0954 01/09/22 0635  NA 139 141  K 4.3 4.6  CL 108 102  CO2 23 26  GLUCOSE 156* 87  BUN 10 15  CREATININE 1.11* 1.23*  CALCIUM 9.1 9.5  AST  --  26  ALT  --  21  ALKPHOS  --  48  BILITOT  --  0.7  ALBUMIN  --  3.2*  MG 2.1 2.3  TSH  --  1.468   Lab Results  Component Value Date   TSH 1.468 01/09/2022    RADIOLOGY STUDIES/RESULTS: ECHOCARDIOGRAM COMPLETE  Result Date: 01/09/2022    ECHOCARDIOGRAM REPORT   Patient Name:   ZAINA KEMMERLING Date of Exam: 01/08/2022 Medical Rec #:  [id number]        Height:       66.0 in Accession #:    [id number]       Weight:       158.7 lb Date of Birth:  04-18-1966        BSA:          1.813 m Patient Age:    66 years         BP:           103/68 mmHg Patient Gender: F                HR:           87 bpm. Exam Location:  Inpatient Procedure: 2D Echo, Cardiac Doppler and Color Doppler Indications:    CHF  History:        Patient has no prior history of Echocardiogram examinations.                 Arrythmias:Tachycardia, Signs/Symptoms:Altered Mental Status;                  Risk Factors:Current Smoker.  Sonographer:    Wenda Low Referring Phys: [place]  1. Left ventricular ejection fraction, by estimation, is 60 to 65%. The left ventricle has normal function. The left ventricle has no regional wall motion abnormalities. Left ventricular diastolic parameters are consistent with Grade I diastolic dysfunction (impaired relaxation).  2. Right ventricular systolic function  is normal. The right ventricular size is normal.  3. The mitral valve is normal in structure. Trivial mitral valve regurgitation. No evidence of mitral stenosis.  4. The aortic valve is tricuspid. Aortic valve regurgitation is not visualized. No aortic stenosis is present.  5. The inferior vena cava is normal in size with greater than 50% respiratory variability, suggesting right atrial pressure of 3 mmHg. FINDINGS  Left Ventricle: Left ventricular ejection fraction, by estimation, is 60 to 65%. The left ventricle has normal function. The left ventricle has no regional wall motion abnormalities. The left ventricular internal cavity size was normal in size. There is  no left ventricular hypertrophy. Left ventricular diastolic parameters are consistent with Grade I diastolic dysfunction (impaired relaxation). Right Ventricle: The right ventricular size is normal. No increase in right ventricular wall thickness. Right ventricular systolic function is normal. Left Atrium: Left atrial size was normal in size. Right Atrium: Right atrial size was normal in size. Pericardium: There is no evidence of pericardial effusion. Mitral Valve: The mitral valve is normal in structure. Trivial mitral valve regurgitation. No evidence of mitral valve stenosis. MV peak gradient, 2.5 mmHg. The mean mitral valve gradient is 1.0 mmHg. Tricuspid Valve: The tricuspid valve is normal in structure. Tricuspid valve regurgitation is not demonstrated. No evidence of tricuspid stenosis. Aortic Valve: The aortic  valve is tricuspid. Aortic valve regurgitation is not visualized. No aortic stenosis is present. Aortic valve mean gradient measures 4.0 mmHg. Aortic valve peak gradient measures 7.2 mmHg. Aortic valve area, by VTI measures 2.29 cm. Pulmonic Valve: The pulmonic valve was normal in structure. Pulmonic valve regurgitation is not visualized. No evidence of pulmonic stenosis. Aorta: The aortic root is normal in size and structure. Venous: The inferior vena cava is normal in size with greater than 50% respiratory variability, suggesting right atrial pressure of 3 mmHg. IAS/Shunts: No atrial level shunt detected by color flow Doppler.  LEFT VENTRICLE PLAX 2D LVIDd:         3.70 cm   Diastology LVIDs:         2.70 cm   LV e' medial:    7.62 cm/s LV PW:         0.70 cm   LV E/e' medial:  7.8 LV IVS:        1.00 cm   LV e' lateral:   11.70 cm/s LVOT diam:     1.90 cm   LV E/e' lateral: 5.1 LV SV:         64 LV SV Index:   35 LVOT Area:     2.84 cm  RIGHT VENTRICLE RV Basal diam:  2.90 cm RV Mid diam:    2.80 cm RV S prime:     11.10 cm/s TAPSE (M-mode): 2.8 cm LEFT ATRIUM             Index        RIGHT ATRIUM           Index LA diam:        3.00 cm 1.65 cm/m   RA Area:     13.40 cm LA Vol (A2C):   41.1 ml 22.67 ml/m  RA Volume:   34.20 ml  18.87 ml/m LA Vol (A4C):   27.9 ml 15.39 ml/m LA Biplane Vol: 34.7 ml 19.14 ml/m  AORTIC VALVE                    PULMONIC VALVE AV Area (Vmax):    2.52  cm     PV Vmax:       0.99 m/s AV Area (Vmean):   2.26 cm     PV Peak grad:  3.9 mmHg AV Area (VTI):     2.29 cm AV Vmax:           134.00 cm/s AV Vmean:          94.300 cm/s AV VTI:            0.278 m AV Peak Grad:      7.2 mmHg AV Mean Grad:      4.0 mmHg LVOT Vmax:         119.00 cm/s LVOT Vmean:        75.100 cm/s LVOT VTI:          0.225 m LVOT/AV VTI ratio: 0.81  AORTA Ao Root diam: 2.60 cm MITRAL VALVE MV Area (PHT): 3.21 cm    SHUNTS MV Area VTI:   3.03 cm    Systemic VTI:  0.22 m MV Peak grad:  2.5 mmHg    Systemic  Diam: 1.90 cm MV Mean grad:  1.0 mmHg MV Vmax:       0.78 m/s MV Vmean:      47.1 cm/s MV Decel Time: 236 msec MV E velocity: 59.10 cm/s MV A velocity: 81.00 cm/s MV E/A ratio:  0.73 Candee Furbish MD Electronically signed by Candee Furbish MD Signature Date/Time: 01/09/2022/6:24:32 AM    Final      LOS: 15 days   Signature  Phillips Climes M.D on 01/10/2022 at 1:46 PM   -  To page go to [email or web address]

## 2022-01-11 NOTE — NC FL2 (Signed)
[place] MEDICAID [id number] LEVEL OF CARE SCREENING TOOL     IDENTIFICATION  Patient Name: Natalie Brown Birthdate: 06-02-1965 Sex: female Admission Date (Current Location): 12/24/2021  [facility] and [place] Number:  Producer, television/film/video and Address:  [facility], [address] [place], [place] [address]      Provider Number: [id number]  Attending Physician Name and Address:  Elgergawy, Leana Roe, MD  Relative Name and Phone Number:       Current Level of Care: Hospital Recommended Level of Care: Assisted Living Facility Prior Approval Number:    Date Approved/Denied:   PASRR Number:    Discharge Plan: Other (Comment) (ALF)    Current Diagnoses: Patient Active Problem List   Diagnosis Date Noted   Altered mental state 12/26/2021   Tachycardia 12/25/2021   Tobacco abuse 12/24/2021   Acute encephalopathy 12/24/2021   Major neurocognitive disorder (HCC) 11/11/2021   Bipolar affective disorder, current episode manic with psychotic symptoms (HCC) 09/12/2021   Dental caries 06/29/2015   Cervical cancer screening 02/22/2014   Current smoker 02/22/2014   Urge incontinence of urine 12/30/2013   Need for prophylactic vaccination and inoculation against influenza 12/30/2013   Bipolar 1 disorder (HCC) 12/30/2013    Orientation RESPIRATION BLADDER Height & Weight     Self  Normal Continent Weight:   Height:     BEHAVIORAL SYMPTOMS/MOOD NEUROLOGICAL BOWEL NUTRITION STATUS      Continent Diet (Regular)  AMBULATORY STATUS COMMUNICATION OF NEEDS Skin   Supervision Verbally Normal                       Personal Care Assistance Level of Assistance  Bathing, Feeding, Dressing Bathing Assistance: Limited assistance Feeding assistance: Independent Dressing Assistance: Limited assistance     Functional Limitations Info             SPECIAL CARE FACTORS FREQUENCY                       Contractures Contractures Info: Not present     Additional Factors Info  Code Status, Allergies, Psychotropic Code Status Info: Full Allergies Info: Penicillins Psychotropic Info: Haldol         Current Medications (01/11/2022):  This is the current hospital active medication list Current Facility-Administered Medications  Medication Dose Route Frequency Provider Last Rate Last Admin   acetaminophen (TYLENOL) tablet 650 mg  650 mg Oral Q6H PRN Orland Mustard, MD   650 mg at 01/07/22 1733   alum & mag hydroxide-simeth (MAALOX/MYLANTA) 200-200-20 MG/5ML suspension 30 mL  30 mL Oral Q4H PRN Orland Mustard, MD       atropine 1 % ophthalmic solution 1 drop  1 drop Sublingual TID [name], MD   1 drop at 01/10/22 2116   cyanocobalamin (VITAMIN B12) tablet 1,000 mcg  1,000 mcg Oral Daily Leroy Sea, MD   1,000 mcg at 01/11/22 0906   docusate sodium (COLACE) capsule 200 mg  200 mg Oral BID Leroy Sea, MD   200 mg at 01/10/22 2115   enoxaparin (LOVENOX) injection 40 mg  40 mg Subcutaneous Q24H Maretta Bees, MD   40 mg at 01/11/22 1154   feeding supplement (KATE FARMS STANDARD 1.4) liquid 325 mL  325 mL Oral BID BM Orland Mustard, MD   325 mL at 01/10/22 1458   fluticasone (FLONASE) 50 MCG/ACT nasal spray 1 spray  1 spray Each Nare Daily PRN [name] Flock,  Ebony Hail, MD       haloperidol (HALDOL) tablet 1 mg  1 mg Oral Daily Rosezetta Schlatter, MD   1 mg at 01/11/22 5701   And   haloperidol (HALDOL) tablet 2 mg  2 mg Oral QHS Rosezetta Schlatter, MD   2 mg at 01/10/22 2115   hydrocerin (EUCERIN) cream   Topical BID Orma Flaming, MD   Given at 01/11/22 0915   hydrOXYzine (ATARAX) tablet 25 mg  25 mg Oral TID PRN Orma Flaming, MD   25 mg at 12/30/21 2158   LORazepam (ATIVAN) tablet 0.5 mg  0.5 mg Oral Q6H PRN Orma Flaming, MD   0.5 mg at 01/08/22 0449   magnesium hydroxide (MILK OF MAGNESIA) suspension 30 mL  30 mL Oral Daily PRN Orma Flaming, MD       metoprolol tartrate (LOPRESSOR) injection 5 mg  5 mg Intravenous Q8H PRN  Thurnell Lose, MD   5 mg at 01/02/22 1857   metoprolol tartrate (LOPRESSOR) tablet 25 mg  25 mg Oral BID Thurnell Lose, MD   25 mg at 01/10/22 2114   nicotine (NICODERM CQ - dosed in mg/24 hr) patch 14 mg  14 mg Transdermal Daily Orma Flaming, MD   14 mg at 01/11/22 0906   OLANZapine zydis (ZYPREXA) disintegrating tablet 5 mg  5 mg Oral Q8H PRN Orma Flaming, MD       And   ziprasidone (GEODON) injection 20 mg  20 mg Intramuscular PRN Orma Flaming, MD       ondansetron [facility]) tablet 4 mg  4 mg Oral Q8H PRN Orma Flaming, MD       Oral care mouth rinse  15 mL Mouth Rinse PRN Thurnell Lose, MD       polyethylene glycol (MIRALAX / GLYCOLAX) packet 17 g  17 g Oral BID Thurnell Lose, MD   17 g at 01/10/22 2115   scopolamine (TRANSDERM-SCOP) 1 MG/3DAYS 1.5 mg  1 patch Transdermal Q72H Thurnell Lose, MD   1.5 mg at 01/07/22 0915     Discharge Medications: Please see discharge summary for a list of discharge medications.  Relevant Imaging Results:  Relevant Lab Results:   Additional Information SSN: [place], [place]

## 2022-01-11 NOTE — Progress Notes (Signed)
Report given to Sherlynn Stalls, Therapist, sports. Endorsed shifts events to oncoming RN. Patient has been stable during shift, vitals stable. Impulsive, bed alarm remains active. Patient had meal today x2, has been ambulatory and worked with PT, has been continent on bowel/bladder. No adverse events during shift. Fall mats x2 and bed in lowest position, call light within reach.

## 2022-01-11 NOTE — Progress Notes (Addendum)
Physical Therapy Treatment Patient Details Name: Natalie Brown MRN: [id number] DOB: 05-05-1965 Today's Date: 01/11/2022   History of Present Illness Patient is a 56 y.o.  female with history of bipolar disorder-who was hospitalized at [facility] received a Ativan challenge for possible catatonia-with minimal improvement-due to persistent altered mental status/tachycardia-patient was admitted to [facility] service for further evaluation and treatment.    PT Comments    Pt received in supine attempting to get OOB with alarm sounding, pt given supervision for safety and assist to get to bathroom, pt agreeable to therapy session for BLE strengthening. Pt needing up to minA for gait progression, pt encouraged to attempt ambulation unassisted but pt insisting on HHA, may trial cane next session as pt currently refusing to trial RW due to cognitive deficit. Pt tachy with exertion, RN notified. Pt continues to benefit from PT services to progress toward functional mobility goals.   Recommendations for follow up therapy are one component of a multi-disciplinary discharge planning process, led by the attending physician.  Recommendations may be updated based on patient status, additional functional criteria and insurance authorization.  Follow Up Recommendations  No PT follow up     Assistance Recommended at Discharge Intermittent Supervision/Assistance  Patient can return home with the following Direct supervision/assist for medications management;Direct supervision/assist for financial management;Assist for transportation   Equipment Recommendations  None recommended by PT    Recommendations for Other Services       Precautions / Restrictions Precautions Precautions: Fall Precaution Comments: impulsive (sitter discontinued) Restrictions Weight Bearing Restrictions: No     Mobility  Bed Mobility Overal bed mobility: Needs Assistance Bed Mobility: Supine to Sit     Supine to sit:  Supervision Sit to supine: Min assist   General bed mobility comments: Increased time, and min A to bring legs into bed. With dense cues, pt able to bridge hips and scoot more toward middle of the bed from the edge    Transfers Overall transfer level: Needs assistance Equipment used: None Transfers: Sit to/from Stand Sit to Stand: Supervision, Min guard           General transfer comment: supervision with safety cues for sitting in proper alignment with lower toilet seat and for use of rail; when getting back to EOB, pt attempting to climb in facing forward, pt given min guard to turn and sidestep toward [facility] then sit and transfer for pt safety.    Ambulation/Gait Ambulation/Gait assistance: Min assist, Min guard Gait Distance (Feet): 400 Feet (64ft x2, seated break, then 410ft) Assistive device: None, 1 person hand held assist Gait Pattern/deviations: Step-through pattern, Drifts right/left       General Gait Details: intermittent minA with lateral LOB, pt with stiff/slow low strides and narrow BOS, attempted to have pt let go of therapist hand to see how balance is unassisted but pt adamant to maintain HHA with PTA.   Stairs Stairs:  (pt refusing)          Balance Overall balance assessment: Needs assistance Sitting-balance support: No upper extremity supported, Feet supported Sitting balance-Leahy Scale: Good     Standing balance support: No upper extremity supported Standing balance-Leahy Scale:  (Fair to Poor) Standing balance comment: static standing fair, dynamic standing poor                            Cognition Arousal/Alertness: Awake/alert Behavior During Therapy: Flat affect  Current Attention Level: Focused, Sustained Memory: Decreased recall of precautions, Decreased short-term memory Following Commands: Follows one step commands with increased time, Follows one step commands inconsistently Safety/Judgement:  Decreased awareness of safety, Decreased awareness of deficits Awareness: Intellectual Problem Solving: Slow processing, Difficulty sequencing, Requires verbal cues General Comments: Pt able to state name and DOB. Pt answering mostly simple yes/no questions appropriately. Slow processing and often needs dense cues for safer body mechanics and wayfinding/navigation. Pt adamantly defers stair negotiation when prompted to attempt.        Exercises      General Comments General comments (skin integrity, edema, etc.): HR 127 bpm seated, to 149 bpm with exertion; SpO2 WFL on RA      Pertinent Vitals/Pain Pain Assessment Pain Assessment: No/denies pain Faces Pain Scale: No hurt Pain Intervention(s): Monitored during session, Repositioned           PT Goals (current goals can now be found in the care plan section) Acute Rehab PT Goals Patient Stated Goal: to return to independent PT Goal Formulation: With patient Time For Goal Achievement: 01/16/22 Progress towards PT goals: Progressing toward goals    Frequency    Min 3X/week      PT Plan Current plan remains appropriate       AM-PAC PT "6 Clicks" Mobility   Outcome Measure  Help needed turning from your back to your side while in a flat bed without using bedrails?: A Little Help needed moving from lying on your back to sitting on the side of a flat bed without using bedrails?: A Little Help needed moving to and from a bed to a chair (including a wheelchair)?: A Little Help needed standing up from a chair using your arms (e.g., wheelchair or bedside chair)?: A Little Help needed to walk in hospital room?: A Little Help needed climbing 3-5 steps with a railing? : Total 6 Click Score: 16    End of Session Equipment Utilized During Treatment: Gait belt Activity Tolerance: Patient tolerated treatment well Patient left: in bed;with call bell/phone within reach;with bed alarm set;Other (comment) (bed in chair  posture) Nurse Communication: Mobility status;Other (comment) (may benefit from in person or remote safety sitter, pt confused and needs stability assist for mobility and fall risk prevention) PT Visit Diagnosis: Other abnormalities of gait and mobility (R26.89)     Time: [id number] PT Time Calculation (min) (ACUTE ONLY): 13 min  Charges:  $Gait Training: 8-22 mins                     Braelynn Lupton P., PTA Acute Rehabilitation Services Secure Chat Preferred 9a-5:30pm Office: [place] 01/11/2022, 4:22 PM

## 2022-01-11 NOTE — Progress Notes (Signed)
PROGRESS NOTE        PATIENT DETAILS Name: Natalie Brown Age: 56 y.o. Sex: female Date of Birth: 04/22/1966 Admit Date: 12/24/2021 Admitting Physician Dewayne Shorter Levora Dredge, MD [id number], [name], NP  Brief Summary:  Patient is a 56 y.o.  female with history of bipolar disorder-who was hospitalized at [facility] received a Ativan challenge for possible catatonia-with minimal improvement-due to persistent altered mental status/tachycardia-patient was admitted to [facility] service for further evaluation and treatment.  Significant events: 9/4>> transferred to [facility]-from [facility]-Altered-no improvement after Ativan challenge.  Significant studies: 9/4>> vitamin B12: 290 9/4>> CXR: No obvious pneumonia 9/4>> MRI brain: No acute intracranial abnormality.  Advanced cerebral/cerebellar atrophy 9/4-9/5>> LTM EEG: No seizures. 9/5>> TSH: 1.3 9/5-9/6>> LTM EEG: Negative. 9/6>> CTA chest: No PE-no PNA.  Significant microbiology data: None  Procedures: None   Consults: Neurology, psychiatry  Subjective:    No significant events overnight, she denies any complaints today.  Objective: Vitals: Blood pressure 107/62, pulse 95, temperature 97.8 F (36.6 C), temperature source Axillary, resp. rate 19, SpO2 96 %.   Exam:  Awake, confused, impaired cognition and insight RRR,No  No Cyanosis, Clubbing or edema, No new Rash or bruise       Assessment/Plan:  Acute encephalopathy:  -Patient does appear with some baseline dementia, with significant finding of nephric and brain atrophy on MRI. -Neurology consulted, signed off -Worsening encephalopathy most likely related to progression of her dementia. -No evidence of seizures on EEG -  Has significant underlying psych issues that could be contributing as well.  No further recommendations from neurology-unclear baseline but she seems better today.    Elevated D-dimer  - has improved negative lower extremity venous  duplex. CTA negative on 12/26/21.  Dementia:  Significant atrophy on neuroimaging-chronic finding-neurology suspects patient has progressive dementia.  TSH/RPR stable-B12 being supplemented.  Await SNF bed , [name] cleared by psych for placement.  Vitamin B12 deficiency:  - Continue supplementation.  Bipolar disorder:  - Appreciate psych input-continue Haldol .  Per psych requires SNF/memory care.  Tachycardia upon admission likely due to combination of dehydration and atropine use, atropine discontinued, has been hydrated.   -Continue to monitor closely.  As needed IV fluids for dehydration.  On low-dose beta-blocker, TSH was stable. CTA -ve, DC Atropine.  -She remains with intermittent episodes of sinus tachycardia, asymptomatic. -2D echo was obtained, no acute findings.   Tobacco abuse: Continue transdermal nicotine  BMI: Estimated body mass index is 25.62 kg/m as calculated from the following:   Height as of an earlier encounter on 12/24/21: 5\' 6"  (1.676 m).   Weight as of an earlier encounter on 12/24/21: 72 kg.   Code status:   Code Status: Prior   DVT Prophylaxis: enoxaparin (LOVENOX) injection 40 mg Start: 12/26/21 1200    Family Communication: None at bedside   Disposition Plan: Status is: Inpt The patient will require care spanning > 2 midnights and should be moved to inpatient because: Needs memory care unit-no safe discharge disposition   Planned Discharge Destination: Memory care/SNF-awaiting bed.  Remains medically stable for transfer.  Diet: Diet Order             Diet regular Room service appropriate? No; Fluid consistency: Thin  Diet effective now  MEDICATIONS: Scheduled Meds:  atropine  1 drop Sublingual TID   vitamin B-12  1,000 mcg Oral Daily   docusate sodium  200 mg Oral BID   enoxaparin (LOVENOX) injection  40 mg Subcutaneous Q24H   feeding supplement (KATE FARMS STANDARD 1.4)  325 mL Oral BID BM   haloperidol  1 mg Oral  Daily   And   haloperidol  2 mg Oral QHS   hydrocerin   Topical BID   metoprolol tartrate  25 mg Oral BID   nicotine  14 mg Transdermal Daily   polyethylene glycol  17 g Oral BID   scopolamine  1 patch Transdermal Q72H   Continuous Infusions: PRN Meds:.acetaminophen, alum & mag hydroxide-simeth, fluticasone, hydrOXYzine, LORazepam, magnesium hydroxide, metoprolol tartrate, OLANZapine zydis **AND** ziprasidone, ondansetron, mouth rinse   I have personally reviewed following labs and imaging studies  LABORATORY DATA:  Recent Labs  Lab 01/09/22 0635  WBC 6.0  HGB 12.6  HCT 38.5  PLT 271  MCV 91.0  MCH 29.8  MCHC 32.7  RDW 13.3    Recent Labs  Lab 01/09/22 0635  NA 141  K 4.6  CL 102  CO2 26  GLUCOSE 87  BUN 15  CREATININE 1.23*  CALCIUM 9.5  AST 26  ALT 21  ALKPHOS 48  BILITOT 0.7  ALBUMIN 3.2*  MG 2.3  TSH 1.468   Lab Results  Component Value Date   TSH 1.468 01/09/2022    RADIOLOGY STUDIES/RESULTS: No results found.   LOS: 16 days   Signature  Phillips Climes M.D on 01/11/2022 at 1:40 PM   -  To page go to [email or web address]

## 2022-01-11 NOTE — Progress Notes (Signed)
Mobility Specialist Progress Note:   01/11/22 0955  Mobility  Activity Ambulated with assistance in hallway  Level of Assistance Minimal assist, patient does 75% or more  Assistive Device Other (Comment) (HHA)  Distance Ambulated (ft) 200 ft  Activity Response Tolerated well  $Mobility charge 1 Mobility   Pt eager for mobility session. Required minA through handheld assist for steadying. HR elevated throughout ambulation in 120s, however improved from pre-mobility HR of 134bpm. Back in bed with all needs met, bed alarm on.  Nelta Numbers Acute Rehab Secure Chat or Office Phone: [phone]

## 2022-01-11 NOTE — Progress Notes (Signed)
Occupational Therapy Treatment Patient Details Name: Natalie Brown MRN: [id number] DOB: Jul 07, 1965 Today's Date: 01/11/2022   History of present illness Patient is a 56 y.o.  female with history of bipolar disorder-who was hospitalized at [facility] received a Ativan challenge for possible catatonia-with minimal improvement-due to persistent altered mental status/tachycardia-patient was admitted to [facility] service for further evaluation and treatment.   OT comments  Patient requesting bathroom usage.  General supervision for basic mobility and Mod cues for thoroughness/safety regarding hygiene and stand grooming.  Post acute recommended for the setting below, and OT can continue to follow in the acute setting.     Recommendations for follow up therapy are one component of a multi-disciplinary discharge planning process, led by the attending physician.  Recommendations may be updated based on patient status, additional functional criteria and insurance authorization.    Follow Up Recommendations  Outpatient OT    Assistance Recommended at Discharge Frequent or constant Supervision/Assistance  Patient can return home with the following  A little help with bathing/dressing/bathroom;Assistance with cooking/housework;Direct supervision/assist for medications management;Direct supervision/assist for financial management;Assist for transportation;A little help with walking and/or transfers;Help with stairs or ramp for entrance   Equipment Recommendations  None recommended by OT    Recommendations for Other Services      Precautions / Restrictions Precautions Precautions: Fall Restrictions Weight Bearing Restrictions: No       Mobility Bed Mobility Overal bed mobility: Needs Assistance Bed Mobility: Supine to Sit, Sit to Supine     Supine to sit: Supervision Sit to supine: Supervision        Transfers Overall transfer level: Needs assistance Equipment used: None Transfers: Sit  to/from Stand Sit to Stand: Supervision                 Balance Overall balance assessment: Needs assistance   Sitting balance-Leahy Scale: Good     Standing balance support: No upper extremity supported Standing balance-Leahy Scale: Fair                             ADL either performed or assessed with clinical judgement   ADL       Grooming: Wash/dry hands;Standing;Cueing for sequencing;Wash/dry face;Supervision/safety Grooming Details (indicate cue type and reason): cues for thoroughness             Lower Body Dressing: Supervision/safety;Sit to/from stand   Toilet Transfer: Supervision/safety;Ambulation;Regular Toilet                    Cognition Arousal/Alertness: Awake/alert Behavior During Therapy: Flat affect                             Safety/Judgement: Decreased awareness of safety, Decreased awareness of deficits Awareness: Intellectual Problem Solving: Slow processing, Difficulty sequencing, Requires verbal cues                             Pertinent Vitals/ Pain       Pain Assessment Pain Assessment: Faces Faces Pain Scale: No hurt Pain Intervention(s): Monitored during session  Frequency  Min 2X/week        Progress Toward Goals  OT Goals(current goals can now be found in the care plan section)  Progress towards OT goals: Progressing toward goals  Acute Rehab OT Goals OT Goal Formulation: With patient Time For Goal Achievement: 01/24/22 Potential to Achieve Goals: Good  Plan Discharge plan remains appropriate    Co-evaluation                 AM-PAC OT "6 Clicks" Daily Activity     Outcome Measure   Help from another person eating meals?: A Little Help from another person taking care of personal grooming?: A Little Help from another person toileting, which includes using toliet, bedpan, or  urinal?: A Little Help from another person bathing (including washing, rinsing, drying)?: A Little Help from another person to put on and taking off regular upper body clothing?: A Little Help from another person to put on and taking off regular lower body clothing?: A Little 6 Click Score: 18    End of Session    OT Visit Diagnosis: Muscle weakness (generalized) (M62.81);History of falling (Z91.81);Other symptoms and signs involving cognitive function   Activity Tolerance Patient tolerated treatment well   Patient Left in bed;with call bell/phone within reach;with bed alarm set   Nurse Communication Other (comment) (patient voided)        Time: 1202-1219 OT Time Calculation (min): 17 min  Charges: OT General Charges $OT Visit: 1 Visit OT Treatments $Self Care/Home Management : 8-22 mins  01/11/2022  RP, OTR/L  Acute Rehabilitation Services  Office:  [phone]   Suzanna Obey 01/11/2022, 12:26 PM

## 2022-01-12 ENCOUNTER — Inpatient Hospital Stay (HOSPITAL_COMMUNITY): Payer: MEDICAID

## 2022-01-12 DIAGNOSIS — R509 Fever, unspecified: Secondary | ICD-10-CM

## 2022-01-12 LAB — BASIC METABOLIC PANEL
Anion gap: 9 (ref 5–15)
BUN: 5 mg/dL — ABNORMAL LOW (ref 6–20)
CO2: 23 mmol/L (ref 22–32)
Calcium: 8.9 mg/dL (ref 8.9–10.3)
Chloride: 108 mmol/L (ref 98–111)
Creatinine, Ser: 1.17 mg/dL — ABNORMAL HIGH (ref 0.44–1.00)
GFR, Estimated: 55 mL/min — ABNORMAL LOW (ref >60)
Glucose, Bld: 98 mg/dL (ref 70–99)
Potassium: 4.5 mmol/L (ref 3.5–5.1)
Sodium: 140 mmol/L (ref 135–145)

## 2022-01-12 LAB — CBC
HCT: 37.4 % (ref 36.0–46.0)
Hemoglobin: 12.8 g/dL (ref 12.0–15.0)
MCH: 30.4 pg (ref 26.0–34.0)
MCHC: 34.2 g/dL (ref 30.0–36.0)
MCV: 88.8 fL (ref 80.0–100.0)
Platelets: 274 10*3/uL (ref 150–400)
RBC: 4.21 MIL/uL (ref 3.87–5.11)
RDW: 13.1 % (ref 11.5–15.5)
WBC: 5.5 10*3/uL (ref 4.0–10.5)
nRBC: 0 % (ref 0.0–0.2)

## 2022-01-12 LAB — URINALYSIS, ROUTINE W REFLEX MICROSCOPIC
Bilirubin Urine: NEGATIVE
Glucose, UA: NEGATIVE mg/dL
Hgb urine dipstick: NEGATIVE
Ketones, ur: 5 mg/dL — AB
Nitrite: NEGATIVE
Protein, ur: NEGATIVE mg/dL
Specific Gravity, Urine: 1.02 (ref 1.005–1.030)
pH: 5 (ref 5.0–8.0)

## 2022-01-12 LAB — PROCALCITONIN: Procalcitonin: <0.1 ng/mL

## 2022-01-12 LAB — LACTIC ACID, PLASMA
Lactic Acid, Venous: <0.3 mmol/L — ABNORMAL LOW (ref 0.5–1.9)
Lactic Acid, Venous: 3.7 mmol/L (ref 0.5–1.9)

## 2022-01-12 LAB — SARS CORONAVIRUS 2 BY RT PCR: SARS Coronavirus 2 by RT PCR: POSITIVE — AB

## 2022-01-12 MED ORDER — SODIUM CHLORIDE 0.9 % IV BOLUS
1000.0000 mL | Freq: Once | INTRAVENOUS | Status: AC
  Administered 2022-01-12: 1000 mL via INTRAVENOUS

## 2022-01-12 NOTE — Plan of Care (Signed)
  Problem: Health Behavior/Discharge Planning: Goal: Ability to manage health-related needs will improve Outcome: Progressing   Problem: Clinical Measurements: Goal: Respiratory complications will improve Outcome: Progressing   Problem: Activity: Goal: Risk for activity intolerance will decrease Outcome: Progressing   Problem: Safety: Goal: Ability to remain free from injury will improve Outcome: Progressing   

## 2022-01-12 NOTE — Plan of Care (Signed)

## 2022-01-12 NOTE — Progress Notes (Signed)
PROGRESS NOTE        PATIENT DETAILS Name: Natalie Brown Age: 56 y.o. Sex: female Date of Birth: Sep 18, 1965 Admit Date: 12/24/2021 Admitting Physician Evalee Mutton Kristeen Mans, MD HCW:[id number], [name], NP  Brief Summary:  Patient is a 56 y.o.  female with history of bipolar disorder-who was hospitalized at [facility] received a Ativan challenge for possible catatonia-with minimal improvement-due to persistent altered mental status/tachycardia-patient was admitted to [facility] service for further evaluation and treatment.  Significant events: 9/4>> transferred to [facility]-from [facility]-Altered-no improvement after Ativan challenge.  Significant studies: 9/4>> vitamin B12: 290 9/4>> CXR: No obvious pneumonia 9/4>> MRI brain: No acute intracranial abnormality.  Advanced cerebral/cerebellar atrophy 9/4-9/5>> LTM EEG: No seizures. 9/5>> TSH: 1.3 9/5-9/6>> LTM EEG: Negative. 9/6>> CTA chest: No PE-no PNA.  Significant microbiology data: None  Procedures: None   Consults: Neurology, psychiatry  Subjective:    No significant events overnight, she denies any complaints today.she spiked a fever of 100.6  Objective: Vitals: Blood pressure 109/75, pulse 88, temperature 98.7 F (37.1 C), temperature source Oral, resp. rate 18, SpO2 99 %.   Exam:  Awake Alert, NAD,.  Impaired cognition and insight Symmetrical Chest wall movement, Good air movement bilaterally, CTAB RRR,No Gallops,Rubs or new Murmurs, No Parasternal Heave +ve B.Sounds, Abd Soft, No tenderness, No rebound - guarding or rigidity. No Cyanosis, Clubbing or edema, No new Rash or bruise        Assessment/Plan:  Acute encephalopathy:  -Patient does appear with some baseline dementia, with significant finding of nephric and brain atrophy on MRI. -Neurology consulted, signed off -Worsening encephalopathy most likely related to progression of her dementia. -No evidence of seizures on EEG -  Has significant  underlying psych issues that could be contributing as well.  No further recommendations from neurology-unclear baseline but she seems better today.    Elevated D-dimer  - has improved negative lower extremity venous duplex. CTA negative on 12/26/21.  Fever -Spiked fever 100.6 overnight, she denies any complaints, will check UA, lactic acid, CBC, procalcitonin and COVID-19.  Dementia:  Significant atrophy on neuroimaging-chronic finding-neurology suspects patient has progressive dementia.  TSH/RPR stable-B12 being supplemented.  Await SNF bed , [name] cleared by psych for placement.  Vitamin B12 deficiency:  - Continue supplementation.  Bipolar disorder:  - Appreciate psych input-continue Haldol .  Per psych requires SNF/memory care.  Tachycardia upon admission likely due to combination of dehydration and atropine use, atropine discontinued, has been hydrated.   -Continue to monitor closely.  As needed IV fluids for dehydration.  On low-dose beta-blocker, TSH was stable. CTA -ve, DC Atropine.  -She remains with intermittent episodes of sinus tachycardia, asymptomatic. -2D echo was obtained, no acute findings.   Tobacco abuse: Continue transdermal nicotine  BMI: Estimated body mass index is 25.62 kg/m as calculated from the following:   Height as of an earlier encounter on 12/24/21: 5\' 6"  (1.676 m).   Weight as of an earlier encounter on 12/24/21: 72 kg.   Code status:   Code Status: Prior   DVT Prophylaxis: enoxaparin (LOVENOX) injection 40 mg Start: 12/26/21 1200    Family Communication: None at bedside   Disposition Plan: Status is: Inpt The patient will require care spanning > 2 midnights and should be moved to inpatient because: Needs memory care unit-no safe discharge disposition   Planned Discharge Destination: Memory care/SNF-awaiting  bed.  Remains medically stable for transfer.  Diet: Diet Order             Diet regular Room service appropriate? No; Fluid  consistency: Thin  Diet effective now                    MEDICATIONS: Scheduled Meds:  atropine  1 drop Sublingual TID   vitamin B-12  1,000 mcg Oral Daily   docusate sodium  200 mg Oral BID   enoxaparin (LOVENOX) injection  40 mg Subcutaneous Q24H   feeding supplement (KATE FARMS STANDARD 1.4)  325 mL Oral BID BM   haloperidol  1 mg Oral Daily   And   haloperidol  2 mg Oral QHS   hydrocerin   Topical BID   metoprolol tartrate  25 mg Oral BID   nicotine  14 mg Transdermal Daily   polyethylene glycol  17 g Oral BID   scopolamine  1 patch Transdermal Q72H   Continuous Infusions: PRN Meds:.acetaminophen, alum & mag hydroxide-simeth, fluticasone, hydrOXYzine, LORazepam, magnesium hydroxide, metoprolol tartrate, OLANZapine zydis **AND** ziprasidone, ondansetron, mouth rinse   I have personally reviewed following labs and imaging studies  LABORATORY DATA:  Recent Labs  Lab 01/09/22 0635 01/12/22 0828  WBC 6.0 5.5  HGB 12.6 12.8  HCT 38.5 37.4  PLT 271 274  MCV 91.0 88.8  MCH 29.8 30.4  MCHC 32.7 34.2  RDW 13.3 13.1    Recent Labs  Lab 01/09/22 0635 01/12/22 0828  NA 141 140  K 4.6 4.5  CL 102 108  CO2 26 23  GLUCOSE 87 98  BUN 15 5*  CREATININE 1.23* 1.17*  CALCIUM 9.5 8.9  AST 26  --   ALT 21  --   ALKPHOS 48  --   BILITOT 0.7  --   ALBUMIN 3.2*  --   MG 2.3  --   LATICACIDVEN  --  <0.3*  TSH 1.468  --    Lab Results  Component Value Date   TSH 1.468 01/09/2022    RADIOLOGY STUDIES/RESULTS: DG Chest Port 1 View  Result Date: 01/12/2022 CLINICAL DATA:  [id number] fever. EXAM: PORTABLE CHEST 1 VIEW COMPARISON:  January 07, 2022 FINDINGS: The cardiomediastinal silhouette is normal in contour. No pleural effusion. No pneumothorax. No acute pleuroparenchymal abnormality. Visualized abdomen is unremarkable. No acute osseous abnormality noted. IMPRESSION: No acute cardiopulmonary abnormality. Electronically Signed   By: Meda Klinefelter M.D.   On:  01/12/2022 11:42     LOS: 17 days   Signature  Huey Bienenstock M.D on 01/12/2022 at 12:15 PM   -  To page go to [email or web address]

## 2022-01-13 DIAGNOSIS — U071 COVID-19: Secondary | ICD-10-CM

## 2022-01-13 LAB — PROCALCITONIN: Procalcitonin: 0.12 ng/mL

## 2022-01-13 NOTE — Progress Notes (Signed)
PROGRESS NOTE        PATIENT DETAILS Name: Natalie Brown Age: 56 y.o. Sex: female Date of Birth: 1965/07/24 Admit Date: 12/24/2021 Admitting Physician Evalee Mutton Kristeen Mans, MD FIE:[id number], [name], NP  Brief Summary:  Patient is a 56 y.o.  female with history of bipolar disorder-who was hospitalized at [facility] received a Ativan challenge for possible catatonia-with minimal improvement-due to persistent altered mental status/tachycardia-patient was admitted to [facility] service for further evaluation and treatment.  Significant events: 9/4>> transferred to [facility]-from [facility]-Altered-no improvement after Ativan challenge.  Significant studies: 9/4>> vitamin B12: 290 9/4>> CXR: No obvious pneumonia 9/4>> MRI brain: No acute intracranial abnormality.  Advanced cerebral/cerebellar atrophy 9/4-9/5>> LTM EEG: No seizures. 9/5>> TSH: 1.3 9/5-9/6>> LTM EEG: Negative. 9/6>> CTA chest: No PE-no PNA. 9/23>> COVID-19 positive  Significant microbiology data: None  Procedures: None   Consults: Neurology, psychiatry  Subjective:    Tmax 99.6, she denies any cough or shortness of breath  Objective: Vitals: Blood pressure 117/81, pulse 99, temperature 98.9 F (37.2 C), temperature source Oral, resp. rate 16, SpO2 100 %.   Exam:  Awake Alert, impaired cognition and insight Symmetrical Chest wall movement, Good air movement bilaterally, CTAB RRR,No Gallops,Rubs or new Murmurs, No Parasternal Heave +ve B.Sounds, Abd Soft, No tenderness, No rebound - guarding or rigidity. No Cyanosis, Clubbing or edema, No new Rash or bruise         Assessment/Plan:  Acute encephalopathy:  -Patient does appear with some baseline dementia, with significant finding of advanced cerebral and cerebellar atrophy on MRI. -Neurology consulted, signed off -Worsening encephalopathy most likely related to progression of her dementia. -No evidence of seizures on EEG -  Has significant  underlying psych issues that could be contributing as well.  No further recommendations from neurology-unclear baseline but she seems better today.    Elevated D-dimer  - has improved negative lower extremity venous duplex. CTA negative on 12/26/21.  Fever>> COVID-19 infection -Spiked fever 9/22, work-up significant for positive COVID-19 swipe, patient is asymptomatic, denies any complaints, no opacities on x-ray, no hypoxia, continue to monitor clinically, no indication for treatment  Lactic acidosis -Due to above, she received fluid bolus  Dementia:  Significant atrophy on neuroimaging-chronic finding-neurology suspects patient has progressive dementia.  TSH/RPR stable-B12 being supplemented.  Await SNF bed , [name] cleared by psych for placement.  Vitamin B12 deficiency:  - Continue supplementation.  Bipolar disorder:  - Appreciate psych input-continue Haldol .  Per psych requires SNF/memory care.  Tachycardia upon admission likely due to combination of dehydration and atropine use, atropine discontinued, has been hydrated.   -Continue to monitor closely.  As needed IV fluids for dehydration.  On low-dose beta-blocker, TSH was stable. CTA -ve, DC Atropine.  -She remains with intermittent episodes of sinus tachycardia, asymptomatic. -2D echo was obtained, no acute findings.   Tobacco abuse: Continue transdermal nicotine  BMI: Estimated body mass index is 25.62 kg/m as calculated from the following:   Height as of an earlier encounter on 12/24/21: 5\' 6"  (1.676 m).   Weight as of an earlier encounter on 12/24/21: 72 kg.   Code status:   Code Status: Prior   DVT Prophylaxis: enoxaparin (LOVENOX) injection 40 mg Start: 12/26/21 1200    Family Communication: None at bedside   Disposition Plan: Status is: Inpt The patient will require care spanning >  2 midnights and should be moved to inpatient because: Needs memory care unit-no safe discharge disposition   Planned Discharge  Destination: Memory care/SNF-awaiting bed.  Remains medically stable for transfer.  Diet: Diet Order             Diet regular Room service appropriate? No; Fluid consistency: Thin  Diet effective now                    MEDICATIONS: Scheduled Meds:  atropine  1 drop Sublingual TID   vitamin B-12  1,000 mcg Oral Daily   docusate sodium  200 mg Oral BID   enoxaparin (LOVENOX) injection  40 mg Subcutaneous Q24H   feeding supplement (KATE FARMS STANDARD 1.4)  325 mL Oral BID BM   haloperidol  1 mg Oral Daily   And   haloperidol  2 mg Oral QHS   hydrocerin   Topical BID   metoprolol tartrate  25 mg Oral BID   nicotine  14 mg Transdermal Daily   polyethylene glycol  17 g Oral BID   scopolamine  1 patch Transdermal Q72H   Continuous Infusions: PRN Meds:.acetaminophen, alum & mag hydroxide-simeth, fluticasone, hydrOXYzine, LORazepam, magnesium hydroxide, metoprolol tartrate, OLANZapine zydis **AND** ziprasidone, ondansetron, mouth rinse   I have personally reviewed following labs and imaging studies  LABORATORY DATA:  Recent Labs  Lab 01/09/22 0635 01/12/22 0828  WBC 6.0 5.5  HGB 12.6 12.8  HCT 38.5 37.4  PLT 271 274  MCV 91.0 88.8  MCH 29.8 30.4  MCHC 32.7 34.2  RDW 13.3 13.1    Recent Labs  Lab 01/09/22 0635 01/12/22 0828 01/12/22 1136 01/13/22 0645  NA 141 140  --   --   K 4.6 4.5  --   --   CL 102 108  --   --   CO2 26 23  --   --   GLUCOSE 87 98  --   --   BUN 15 5*  --   --   CREATININE 1.23* 1.17*  --   --   CALCIUM 9.5 8.9  --   --   AST 26  --   --   --   ALT 21  --   --   --   ALKPHOS 48  --   --   --   BILITOT 0.7  --   --   --   ALBUMIN 3.2*  --   --   --   MG 2.3  --   --   --   PROCALCITON  --  <0.10  --  0.12  LATICACIDVEN  --  <0.3* 3.7*  --   TSH 1.468  --   --   --    Lab Results  Component Value Date   TSH 1.468 01/09/2022    RADIOLOGY STUDIES/RESULTS: DG Chest Port 1 View  Result Date: 01/12/2022 CLINICAL DATA:  [id number]  fever. EXAM: PORTABLE CHEST 1 VIEW COMPARISON:  January 07, 2022 FINDINGS: The cardiomediastinal silhouette is normal in contour. No pleural effusion. No pneumothorax. No acute pleuroparenchymal abnormality. Visualized abdomen is unremarkable. No acute osseous abnormality noted. IMPRESSION: No acute cardiopulmonary abnormality. Electronically Signed   By: Meda Klinefelter M.D.   On: 01/12/2022 11:42     LOS: 18 days   Signature  Huey Bienenstock M.D on 01/13/2022 at 1:00 PM   -  To page go to [email or web address]

## 2022-01-14 ENCOUNTER — Inpatient Hospital Stay (HOSPITAL_COMMUNITY): Payer: MEDICAID

## 2022-01-14 LAB — COMPREHENSIVE METABOLIC PANEL
ALT: 18 U/L (ref 0–44)
AST: 23 U/L (ref 15–41)
Albumin: 3 g/dL — ABNORMAL LOW (ref 3.5–5.0)
Alkaline Phosphatase: 47 U/L (ref 38–126)
Anion gap: 8 (ref 5–15)
BUN: 14 mg/dL (ref 6–20)
CO2: 25 mmol/L (ref 22–32)
Calcium: 9.1 mg/dL (ref 8.9–10.3)
Chloride: 107 mmol/L (ref 98–111)
Creatinine, Ser: 1.14 mg/dL — ABNORMAL HIGH (ref 0.44–1.00)
GFR, Estimated: 57 mL/min — ABNORMAL LOW (ref >60)
Glucose, Bld: 93 mg/dL (ref 70–99)
Potassium: 4.5 mmol/L (ref 3.5–5.1)
Sodium: 140 mmol/L (ref 135–145)
Total Bilirubin: 0.6 mg/dL (ref 0.3–1.2)
Total Protein: 6.4 g/dL — ABNORMAL LOW (ref 6.5–8.1)

## 2022-01-14 LAB — AMMONIA: Ammonia: 34 umol/L (ref 9–35)

## 2022-01-14 LAB — LACTIC ACID, PLASMA: Lactic Acid, Venous: 0.9 mmol/L (ref 0.5–1.9)

## 2022-01-14 NOTE — Progress Notes (Signed)
Occupational Therapy Treatment Patient Details Name: Natalie Brown MRN: [id number] DOB: 07-Oct-1965 Today's Date: 01/14/2022   History of present illness Patient is a 56 y.o.  female with history of bipolar disorder-who was hospitalized at [facility] received a Ativan challenge for possible catatonia-with minimal improvement-due to persistent altered mental status/tachycardia-patient was admitted to [facility] service for further evaluation and treatment.   OT comments  Patient ambulating to bathroom with mobility specialist upon entry. Patient performed grooming tasks standing at sink with verbal cues for sequencing and increased time to perform.  Patient changed socks while seated on EOB with supervision and increased time. Patient was able to return to supine and required cues to scoot up in bed. Patient performed well with OT treatment but required increased time to perform most tasks and cues for sequencing. Acute OT to continue to follow with discharge recommendations continue to be appropriate.    Recommendations for follow up therapy are one component of a multi-disciplinary discharge planning process, led by the attending physician.  Recommendations may be updated based on patient status, additional functional criteria and insurance authorization.    Follow Up Recommendations  Outpatient OT    Assistance Recommended at Discharge Frequent or constant Supervision/Assistance  Patient can return home with the following  A little help with bathing/dressing/bathroom;Assistance with cooking/housework;Direct supervision/assist for medications management;Direct supervision/assist for financial management;Assist for transportation;A little help with walking and/or transfers;Help with stairs or ramp for entrance   Equipment Recommendations  None recommended by OT    Recommendations for Other Services      Precautions / Restrictions Precautions Precautions: Fall Precaution Comments: impulsive (sitter  discontinued) Restrictions Weight Bearing Restrictions: No       Mobility Bed Mobility Overal bed mobility: Needs Assistance Bed Mobility: Sit to Supine       Sit to supine: Supervision   General bed mobility comments: able to get to supine without assistance and required frequent cueing on positioning self in bed    Transfers Overall transfer level: Needs assistance Equipment used: None Transfers: Sit to/from Stand Sit to Stand: Supervision, Min guard     Step pivot transfers: Supervision     General transfer comment: supervision and frequent cues for safety     Balance Overall balance assessment: Needs assistance Sitting-balance support: No upper extremity supported, Feet supported Sitting balance-Leahy Scale: Good     Standing balance support: No upper extremity supported Standing balance-Leahy Scale: Fair Standing balance comment: able to stand at sink and perform grooming without UE support                           ADL either performed or assessed with clinical judgement   ADL Overall ADL's : Needs assistance/impaired     Grooming: Wash/dry hands;Wash/dry face;Oral care;Supervision/safety;Cueing for sequencing;Standing Grooming Details (indicate cue type and reason): standing at sink             Lower Body Dressing: Supervision/safety;Sit to/from stand Lower Body Dressing Details (indicate cue type and reason): donning socks Toilet Transfer: Supervision/safety;Ambulation;Regular Teacher, adult education Details (indicate cue type and reason): to regular toilet with frequent cues for safety Toileting- Clothing Manipulation and Hygiene: Supervision/safety;Sitting/lateral lean         General ADL Comments: increased time to perform self care tasks    Extremity/Trunk Assessment              Vision       Perception     Praxis  Cognition Arousal/Alertness: Awake/alert Behavior During Therapy: Flat affect Overall Cognitive  Status: No family/caregiver present to determine baseline cognitive functioning Area of Impairment: Orientation, Attention, Memory, Following commands, Safety/judgement, Awareness, Problem solving                 Orientation Level: Disoriented to, Time Current Attention Level: Focused, Sustained Memory: Decreased recall of precautions, Decreased short-term memory Following Commands: Follows one step commands with increased time, Follows one step commands inconsistently Safety/Judgement: Decreased awareness of safety, Decreased awareness of deficits Awareness: Intellectual Problem Solving: Slow processing, Difficulty sequencing, Requires verbal cues General Comments: slow processing and limited vocalization. Able to state name and follow simple commands        Exercises      Shoulder Instructions       General Comments      Pertinent Vitals/ Pain       Pain Assessment Pain Assessment: Faces Faces Pain Scale: No hurt  Home Living                                          Prior Functioning/Environment              Frequency  Min 2X/week        Progress Toward Goals  OT Goals(current goals can now be found in the care plan section)  Progress towards OT goals: Progressing toward goals  Acute Rehab OT Goals Patient Stated Goal: get better OT Goal Formulation: With patient Time For Goal Achievement: 01/24/22 Potential to Achieve Goals: Good ADL Goals Pt Will Perform Grooming: Independently;standing Pt Will Perform Upper Body Dressing: Independently;sitting Pt Will Perform Lower Body Dressing: Independently;sit to/from stand Additional ADL Goal #1: Pt will indep complete IADL medication management task Additional ADL Goal #2: pt will follow 2-3 step commands 50% of the session to complete functional tasks  Plan Discharge plan remains appropriate    Co-evaluation                 AM-PAC OT "6 Clicks" Daily Activity     Outcome  Measure   Help from another person eating meals?: A Little Help from another person taking care of personal grooming?: A Little Help from another person toileting, which includes using toliet, bedpan, or urinal?: A Little Help from another person bathing (including washing, rinsing, drying)?: A Little Help from another person to put on and taking off regular upper body clothing?: A Little Help from another person to put on and taking off regular lower body clothing?: A Little 6 Click Score: 18    End of Session    OT Visit Diagnosis: Muscle weakness (generalized) (M62.81);History of falling (Z91.81);Other symptoms and signs involving cognitive function   Activity Tolerance Patient tolerated treatment well   Patient Left in bed;with call bell/phone within reach;with bed alarm set   Nurse Communication Mobility status        Time: 2202-5427 OT Time Calculation (min): 28 min  Charges: OT General Charges $OT Visit: 1 Visit OT Treatments $Self Care/Home Management : 23-37 mins  Lodema Hong, [place]  Office [place] 01/14/2022, 12:20 PM

## 2022-01-14 NOTE — Progress Notes (Signed)
Physical Therapy Treatment Patient Details Name: Natalie Brown MRN: [id number] DOB: April 03, 1966 Today's Date: 01/14/2022   History of Present Illness Patient is a 56 y.o.  female with history of bipolar disorder-who was hospitalized at [facility] received a Ativan challenge for possible catatonia-with minimal improvement-due to persistent altered mental status/tachycardia-patient was admitted to [facility] service for further evaluation and treatment.    PT Comments    Pt was seen for mobility to walk in her room including to work on turns and balance challenges.  Pt was sitting on side of bed nearly ready to fall on floor when PT arrived, and in response PT had bed alarm on and bed rails up when tx ended.  Follow up is anticipated to have assistance at home, but PT is not expected to be needed as pt becomes more alert and responsive to stimuli.  Focus on balance and attention to safety with all gait and standing activities.  May need more supervision at times or at least to not leave bed alarm in lower alarm position as was the case when PT arrived.    Recommendations for follow up therapy are one component of a multi-disciplinary discharge planning process, led by the attending physician.  Recommendations may be updated based on patient status, additional functional criteria and insurance authorization.  Follow Up Recommendations  No PT follow up     Assistance Recommended at Discharge Intermittent Supervision/Assistance  Patient can return home with the following Direct supervision/assist for medications management;Direct supervision/assist for financial management;Assist for transportation   Equipment Recommendations  None recommended by PT    Recommendations for Other Services       Precautions / Restrictions Precautions Precautions: Fall Precaution Comments: impulsive Restrictions Weight Bearing Restrictions: No     Mobility  Bed Mobility Overal bed mobility: Needs Assistance Bed  Mobility: Supine to Sit, Sit to Supine     Supine to sit: Min assist Sit to supine: Min assist   General bed mobility comments: assist needed, pt resisting getting back to bed    Transfers Overall transfer level: Needs assistance Equipment used: None Transfers: Sit to/from Stand Sit to Stand: Min guard   Step pivot transfers: Min assist       General transfer comment: pt is leaning and unsafe with all movement    Ambulation/Gait Ambulation/Gait assistance: Min assist Gait Distance (Feet): 80 Feet Assistive device: 1 person hand held assist Gait Pattern/deviations: Step-through pattern, Decreased stride length, Wide base of support Gait velocity: reduced Gait velocity interpretation: <1.31 ft/sec, indicative of household ambulator   General Gait Details: min assist to control walking in her room, with tendency to change directions suddenly and to go over uneven surfaces with slow response to balance challenges   Stairs             Wheelchair Mobility    Modified Rankin (Stroke Patients Only)       Balance Overall balance assessment: Needs assistance Sitting-balance support: Feet supported Sitting balance-Leahy Scale: Good Sitting balance - Comments: pt is sitting sideways on side of bed with R hip essentially NWB and nearly ready to fall when PT arrives     Standing balance-Leahy Scale: Fair Standing balance comment: fair standing but with walking is min assist at times                            Cognition Arousal/Alertness: Awake/alert, Lethargic Behavior During Therapy: Flat affect, Impulsive Overall Cognitive Status: No family/caregiver  present to determine baseline cognitive functioning Area of Impairment: Awareness, Safety/judgement, Attention                 Orientation Level: Situation, Time Current Attention Level: Selective Memory: Decreased short-term memory, Decreased recall of precautions Following Commands: Follows one  step commands with increased time Safety/Judgement: Decreased awareness of safety, Decreased awareness of deficits Awareness: Intellectual Problem Solving: Slow processing, Requires verbal cues, Requires tactile cues General Comments: pt is mostly non verbal and trying to get off bed when PT arrived        Exercises      General Comments General comments (skin integrity, edema, etc.): Pt is expected to go home with no PT follow up due to recovery of mobility      Pertinent Vitals/Pain Pain Assessment Pain Assessment: Faces Faces Pain Scale: No hurt    Home Living                          Prior Function            PT Goals (current goals can now be found in the care plan section) Progress towards PT goals: Progressing toward goals    Frequency    Min 3X/week      PT Plan Current plan remains appropriate    Co-evaluation              AM-PAC PT "6 Clicks" Mobility   Outcome Measure  Help needed turning from your back to your side while in a flat bed without using bedrails?: A Little Help needed moving from lying on your back to sitting on the side of a flat bed without using bedrails?: A Little Help needed moving to and from a bed to a chair (including a wheelchair)?: A Little Help needed standing up from a chair using your arms (e.g., wheelchair or bedside chair)?: A Little Help needed to walk in hospital room?: A Little Help needed climbing 3-5 steps with a railing? : A Little 6 Click Score: 18    End of Session Equipment Utilized During Treatment: Gait belt Activity Tolerance: Patient tolerated treatment well Patient left: in bed;with call bell/phone within reach;with bed alarm set;Other (comment) Nurse Communication: Mobility status;Other (comment) (nearly in a fall posture upon arrival of PT) PT Visit Diagnosis: Other abnormalities of gait and mobility (R26.89)     Time: 1459-1531 PT Time Calculation (min) (ACUTE ONLY): 32  min  Charges:  $Gait Training: 8-22 mins $Therapeutic Activity: 8-22 mins    Ramond Dial 01/14/2022, 5:32 PM  Mee Hives, PT PhD Acute Rehab Dept. Number: [place]

## 2022-01-14 NOTE — Consult Note (Signed)
Brief Psychiatry Consult Note  Initially consulted for pt being more lethargic/difficult to arouse and prior concern for catatonia; consult later cancelled by Dr. Waldron Labs as pt woke up and became more participative. Consult no longer desired and will cancel at this time. NC1.    Please see last psych notes for full assessment at that time.   Natalie Brown A Earnestene Angello

## 2022-01-14 NOTE — Plan of Care (Signed)
  Problem: Safety: Goal: Non-violent Restraint(s) Outcome: Progressing   Problem: Education: Goal: Knowledge of General Education information will improve Description: Including pain rating scale, medication(s)/side effects and non-pharmacologic comfort measures Outcome: Progressing   Problem: Activity: Goal: Risk for activity intolerance will decrease Outcome: Progressing   Problem: Nutrition: Goal: Adequate nutrition will be maintained Outcome: Progressing   Problem: Elimination: Goal: Will not experience complications related to bowel motility Outcome: Progressing   Problem: Safety: Goal: Ability to remain free from injury will improve Outcome: Progressing

## 2022-01-14 NOTE — Progress Notes (Signed)
PROGRESS NOTE        PATIENT DETAILS Name: Natalie Brown Age: 56 y.o. Sex: female Date of Birth: April 04, 1966 Admit Date: 12/24/2021 Admitting Physician Evalee Mutton Kristeen Mans, MD QMV:[id number], [name], NP  Brief Summary:  Patient is a 56 y.o.  female with history of bipolar disorder-who was hospitalized at [facility] received a Ativan challenge for possible catatonia-with minimal improvement-due to persistent altered mental status/tachycardia-patient was admitted to [facility] service for further evaluation and treatment.  No significant finding regarding her tachycardia work-up, 2D echo within normal limit, no significant electrolyte abnormalities, patient could not be discharged back to behavioral health as apparently she does not meet inpatient psych criteria anymore.  Significant events: 9/4>> transferred to [facility]-from [facility]-Altered-no improvement after Ativan challenge.  Significant studies: 9/4>> vitamin B12: 290 9/4>> CXR: No obvious pneumonia 9/4>> MRI brain: No acute intracranial abnormality.  Advanced cerebral/cerebellar atrophy 9/4-9/5>> LTM EEG: No seizures. 9/5>> TSH: 1.3 9/5-9/6>> LTM EEG: Negative. 9/6>> CTA chest: No PE-no PNA. 9/23>> COVID-19 positive  Significant microbiology data: None  Procedures: None   Consults: Neurology, psychiatry  Subjective:    she is afebrile over last 24 hours, significant events overnight, she is less interactive today  Objective: Vitals: Blood pressure 92/70, pulse 78, temperature 98.4 F (36.9 C), temperature source Oral, resp. rate 16, SpO2 93 %.   Exam:  Patient is sitting in the bed, noninteractive, does not answer any questions or follow any commands Symmetrical Chest wall movement, Good air movement bilaterally, CTAB RRR,No Gallops,Rubs or new Murmurs, No Parasternal Heave +ve B.Sounds, Abd Soft, No tenderness, No rebound - guarding or rigidity. No Cyanosis, Clubbing or edema, No new Rash or bruise           Assessment/Plan:  Acute encephalopathy:  -Patient does appear with some baseline dementia, with significant finding of advanced cerebral and cerebellar atrophy on MRI. -Neurology consulted, signed off -Worsening encephalopathy most likely related to progression of her dementia. -No evidence of seizures on EEG -  Has significant underlying psych issues that could be contributing as well.  No further recommendations from neurology-unclear baseline but she seems better today.   -This morning patient appears to be less interactive, he had similar presentations while she was at behavioral health for which she was treated with Ativan for presumed catatonia without much improvement, I will reconsult psych regarding that, will obtain CT head, and ammonia level.  Elevated D-dimer  - has improved negative lower extremity venous duplex. CTA negative on 12/26/21.  Fever>> COVID-19 infection -Spiked fever 9/22, work-up significant for positive COVID-19 swipe, patient is asymptomatic, denies any complaints, no opacities on x-ray, no hypoxia, continue to monitor clinically, no indication for treatment  Lactic acidosis -Due to above, she received fluid bolus  Dementia:  Significant atrophy on neuroimaging-chronic finding-neurology suspects patient has progressive dementia.  TSH/RPR stable-B12 being supplemented.  Await SNF bed , [name] cleared by psych for placement.  Vitamin B12 deficiency:  - Continue supplementation.  Bipolar disorder:  - Appreciate psych input-continue Haldol .  Per psych requires SNF/memory care.  Tachycardia upon admission likely due to combination of dehydration and atropine use, atropine discontinued, has been hydrated.   -Continue to monitor closely.  As needed IV fluids for dehydration.  On low-dose beta-blocker, TSH was stable. CTA -ve, DC Atropine.  -She remains with intermittent episodes of sinus tachycardia, asymptomatic. -2D  echo was obtained, no acute  findings.   Tobacco abuse: Continue transdermal nicotine  BMI: Estimated body mass index is 25.62 kg/m as calculated from the following:   Height as of an earlier encounter on 12/24/21: 5\' 6"  (1.676 m).   Weight as of an earlier encounter on 12/24/21: 72 kg.   Code status:   Code Status: Prior   DVT Prophylaxis: enoxaparin (LOVENOX) injection 40 mg Start: 12/26/21 1200    Family Communication: None at bedside   Disposition Plan: Status is: Inpt The patient will require care spanning > 2 midnights and should be moved to inpatient because: Needs memory care unit-no safe discharge disposition   Planned Discharge Destination: Memory care/SNF-awaiting bed.  Remains medically stable for transfer.  Diet: Diet Order             Diet regular Room service appropriate? No; Fluid consistency: Thin  Diet effective now                    MEDICATIONS: Scheduled Meds:  atropine  1 drop Sublingual TID   vitamin B-12  1,000 mcg Oral Daily   docusate sodium  200 mg Oral BID   enoxaparin (LOVENOX) injection  40 mg Subcutaneous Q24H   feeding supplement (KATE FARMS STANDARD 1.4)  325 mL Oral BID BM   haloperidol  1 mg Oral Daily   And   haloperidol  2 mg Oral QHS   hydrocerin   Topical BID   metoprolol tartrate  25 mg Oral BID   nicotine  14 mg Transdermal Daily   polyethylene glycol  17 g Oral BID   scopolamine  1 patch Transdermal Q72H   Continuous Infusions: PRN Meds:.acetaminophen, alum & mag hydroxide-simeth, fluticasone, hydrOXYzine, LORazepam, magnesium hydroxide, metoprolol tartrate, OLANZapine zydis **AND** ziprasidone, ondansetron, mouth rinse   I have personally reviewed following labs and imaging studies  LABORATORY DATA:  Recent Labs  Lab 01/09/22 0635 01/12/22 0828  WBC 6.0 5.5  HGB 12.6 12.8  HCT 38.5 37.4  PLT 271 274  MCV 91.0 88.8  MCH 29.8 30.4  MCHC 32.7 34.2  RDW 13.3 13.1    Recent Labs  Lab 01/09/22 0635 01/12/22 0828 01/12/22 1136  01/13/22 0645  NA 141 140  --   --   K 4.6 4.5  --   --   CL 102 108  --   --   CO2 26 23  --   --   GLUCOSE 87 98  --   --   BUN 15 5*  --   --   CREATININE 1.23* 1.17*  --   --   CALCIUM 9.5 8.9  --   --   AST 26  --   --   --   ALT 21  --   --   --   ALKPHOS 48  --   --   --   BILITOT 0.7  --   --   --   ALBUMIN 3.2*  --   --   --   MG 2.3  --   --   --   PROCALCITON  --  <0.10  --  0.12  LATICACIDVEN  --  <0.3* 3.7*  --   TSH 1.468  --   --   --    Lab Results  Component Value Date   TSH 1.468 01/09/2022    RADIOLOGY STUDIES/RESULTS: No results found.   LOS: 19 days   Signature  Phillips Climes M.D on  01/14/2022 at 12:50 PM   -  To page go to [email or web address]

## 2022-01-14 NOTE — TOC Progression Note (Addendum)
Transition of Care [facility]) - Progression Note    Patient Details  Name: Natalie Brown MRN: [id number] Date of Birth: December 25, 1965  Transition of Care [facility]) CM/SW [place], LCSW Phone Number: 01/14/2022, 9:56 AM  Clinical Narrative:    Referral sent to [facility].  Referral sent to [facility].   Referral emailed to [facility]   Referral emailed to [facility].  Referral emailed to [facility].   Referral emailed to [facility].   Referral emailed to [facility] in [place].   [place] does not have any beds coming open.   Left voicemail for [facility], [place], and [place].   Update: [place] unable to accept patient due to her age.    CSW received a call from patient's mother. She stated she finally received the patient's Medicaid card and letter from [facility]. She has been awarded Psychologist, prison and probation services for patient and will bring a copy of the paperwork to the hospital.    Barriers to Discharge: Financial Resources, Inadequate or no insurance, Homeless with medical needs  Expected Discharge Plan and Services   In-house Referral: Clinical Social Work   Post Acute Care Choice: Nursing Home Living arrangements for the past 2 months: Homeless [facility]) Expected Discharge Date: 12/24/21                                     Social Determinants of Health (SDOH) Interventions    Readmission Risk Interventions     No data to display

## 2022-01-14 NOTE — Progress Notes (Signed)
Mobility Specialist Progress Note:   01/14/22 1000  Mobility  Activity Ambulated with assistance to bathroom  Level of Assistance Moderate assist, patient does 50-74%  Assistive Device Other (Comment) (HHA)  Distance Ambulated (ft) 30 ft  Activity Response Tolerated well  $Mobility charge 1 Mobility   Pt difficult to arouse upon arrival, resulting in increased confusion. Pt asks if she can go to BR, required up to modA at times to steady. Pt left sitting at sink with OT for therapy session. All needs met.   Nelta Numbers Acute Rehab Secure Chat or Office Phone: [phone]

## 2022-01-15 NOTE — Progress Notes (Signed)
Mobility Specialist Progress Note:   01/15/22 0940  Mobility  Activity Ambulated with assistance to bathroom  Level of Assistance Minimal assist, patient does 75% or more  Assistive Device None;Other (Comment) (HHA)  Distance Ambulated (ft) 30 ft  Activity Response Tolerated well  $Mobility charge 1 Mobility   Pt agreeable to mobility session, requesting to go to BR. Pt required minA via HHA throughout ambulation. Back in bed with all needs met, bed alarm on.     Acute Rehab Secure Chat or Office Phone: [phone]  

## 2022-01-15 NOTE — Progress Notes (Signed)
PROGRESS NOTE        PATIENT DETAILS Name: Natalie Brown Age: 56 y.o. Sex: female Date of Birth: [birth year] Admit Date: 12/24/2021 Admitting Physician Evalee Mutton Kristeen Mans, MD AOZ:[id number], [name], NP  Brief Summary:  Patient is a 56 y.o.  female with history of bipolar disorder-who was hospitalized at [facility] received a Ativan challenge for possible catatonia-with minimal improvement-due to persistent altered mental status/tachycardia-patient was admitted to [facility] service for further evaluation and treatment.  No significant finding regarding her tachycardia work-up, 2D echo within normal limit, no significant electrolyte abnormalities, patient could not be discharged back to behavioral health as apparently she does not meet inpatient psych criteria anymore.  Significant events: 9/4>> transferred to [facility]-from [facility]-Altered-no improvement after Ativan challenge.  Significant studies: 9/4>> vitamin B12: 290 9/4>> CXR: No obvious pneumonia 9/4>> MRI brain: No acute intracranial abnormality.  Advanced cerebral/cerebellar atrophy 9/4-9/5>> LTM EEG: No seizures. 9/5>> TSH: 1.3 9/5-9/6>> LTM EEG: Negative. 9/6>> CTA chest: No PE-no PNA. 9/23>> COVID-19 positive  Significant microbiology data: None  Procedures: None   Consults: Neurology, psychiatry  Subjective:    she is afebrile over last 24 hours, significant events overnight, she is less interactive today  Objective: Vitals: Blood pressure 98/66, pulse 89, temperature (!) 97.5 F (36.4 C), temperature source Oral, resp. rate 18, SpO2 100 %.   Exam:  During in bed, no apparent distress, minimally communicative, sometimes answering yes or no question, avoiding eye contact, looking at the TV during interaction Symmetrical Chest wall movement,  +ve B.Sounds, Abd Soft, No Cyanosis, Clubbing or edema, No new Rash or bruise           Assessment/Plan:  Acute encephalopathy:  -Patient does appear  with some baseline dementia, with significant finding of advanced cerebral and cerebellar atrophy on MRI. -Neurology consulted, signed off -Worsening encephalopathy most likely related to progression of her dementia. -No evidence of seizures on EEG -  Has significant underlying psych issues that could be contributing as well.  No further recommendations from neurology-unclear baseline but she seems better today.   -This morning patient appears to be less interactive, he had similar presentations while she was at behavioral health for which she was treated with Ativan for presumed catatonia without much improvement, I will reconsult psych regarding that, will obtain CT head, and ammonia level.  Elevated D-dimer  - has improved negative lower extremity venous duplex. CTA negative on 12/26/21.  Fever>> COVID-19 infection -Spiked fever 9/22, work-up significant for positive COVID-19 swipe, patient is asymptomatic, denies any complaints, no opacities on x-ray, no hypoxia, continue to monitor clinically, no indication for treatment  Lactic acidosis -Due to above, she received fluid bolus  Dementia:  Significant atrophy on neuroimaging-chronic finding-neurology suspects patient has progressive dementia.  TSH/RPR stable-B12 being supplemented.  Await SNF bed , [name] cleared by psych for placement.  Vitamin B12 deficiency:  - Continue supplementation.  Bipolar disorder:  - Appreciate psych input-continue Haldol .  Per psych requires SNF/memory care.  Tachycardia upon admission likely due to combination of dehydration and atropine use, atropine discontinued, has been hydrated.   -Continue to monitor closely.  As needed IV fluids for dehydration.  On low-dose beta-blocker, TSH was stable. CTA -ve, DC Atropine.  -She remains with intermittent episodes of sinus tachycardia, asymptomatic. -2D echo was obtained, no acute findings.   Tobacco abuse: Continue  transdermal nicotine  BMI: Estimated body  mass index is 25.62 kg/m as calculated from the following:   Height as of an earlier encounter on 12/24/21: 5\' 6"  (1.676 m).   Weight as of an earlier encounter on 12/24/21: 72 kg.   Code status:   Code Status: Prior   DVT Prophylaxis: enoxaparin (LOVENOX) injection 40 mg Start: 12/26/21 1200    Family Communication: None at bedside   Disposition Plan: Status is: Inpt Planned Discharge Destination: Memory care/SNF-awaiting bed.  Remains medically stable for transfer.  Diet: Diet Order             Diet regular Room service appropriate? No; Fluid consistency: Thin  Diet effective now                    MEDICATIONS: Scheduled Meds:  atropine  1 drop Sublingual TID   vitamin B-12  1,000 mcg Oral Daily   docusate sodium  200 mg Oral BID   enoxaparin (LOVENOX) injection  40 mg Subcutaneous Q24H   feeding supplement (KATE FARMS STANDARD 1.4)  325 mL Oral BID BM   haloperidol  1 mg Oral Daily   And   haloperidol  2 mg Oral QHS   hydrocerin   Topical BID   metoprolol tartrate  25 mg Oral BID   nicotine  14 mg Transdermal Daily   polyethylene glycol  17 g Oral BID   scopolamine  1 patch Transdermal Q72H   Continuous Infusions: PRN Meds:.acetaminophen, alum & mag hydroxide-simeth, fluticasone, hydrOXYzine, LORazepam, magnesium hydroxide, metoprolol tartrate, OLANZapine zydis **AND** ziprasidone, ondansetron, mouth rinse   I have personally reviewed following labs and imaging studies  LABORATORY DATA:  Recent Labs  Lab 01/09/22 0635 01/12/22 0828  WBC 6.0 5.5  HGB 12.6 12.8  HCT 38.5 37.4  PLT 271 274  MCV 91.0 88.8  MCH 29.8 30.4  MCHC 32.7 34.2  RDW 13.3 13.1    Recent Labs  Lab 01/09/22 0635 01/12/22 0828 01/12/22 1136 01/13/22 0645 01/14/22 1328  NA 141 140  --   --  140  K 4.6 4.5  --   --  4.5  CL 102 108  --   --  107  CO2 26 23  --   --  25  GLUCOSE 87 98  --   --  93  BUN 15 5*  --   --  14  CREATININE 1.23* 1.17*  --   --  1.14*  CALCIUM  9.5 8.9  --   --  9.1  AST 26  --   --   --  23  ALT 21  --   --   --  18  ALKPHOS 48  --   --   --  47  BILITOT 0.7  --   --   --  0.6  ALBUMIN 3.2*  --   --   --  3.0*  MG 2.3  --   --   --   --   PROCALCITON  --  <0.10  --  0.12  --   LATICACIDVEN  --  <0.3* 3.7*  --  0.9  TSH 1.468  --   --   --   --   AMMONIA  --   --   --   --  34   Lab Results  Component Value Date   TSH 1.468 01/09/2022    RADIOLOGY STUDIES/RESULTS: CT HEAD WO CONTRAST (01/11/2022)  Result Date: 01/14/2022 CLINICAL DATA:  Lethargy EXAM: CT HEAD WITHOUT CONTRAST TECHNIQUE: Contiguous axial images were obtained from the base of the skull through the vertex without intravenous contrast. RADIATION DOSE REDUCTION: This exam was performed according to the departmental dose-optimization program which includes automated exposure control, adjustment of the mA and/or kV according to patient size and/or use of iterative reconstruction technique. COMPARISON:  10/09/2021 CT head FINDINGS: Brain: No evidence of acute infarction, hemorrhage, mass, mass effect, or midline shift. No hydrocephalus or extra-axial fluid collection. Mildly advanced cerebral volume loss for age. Vascular: No hyperdense vessel. Skull: Normal. Negative for fracture or focal lesion. Sinuses/Orbits: Mucosal thickening in the right ethmoid air cells. The orbits are unremarkable. Other: The mastoid air cells are well aerated. IMPRESSION: No acute intracranial process. Electronically Signed   By: Wiliam Ke M.D.   On: 01/14/2022 19:33     LOS: 20 days   Signature  Huey Bienenstock M.D on 01/15/2022 at 3:42 PM   -  To page go to [email or web address]

## 2022-01-16 NOTE — Progress Notes (Signed)
Occupational Therapy Treatment Patient Details Name: Natalie Brown MRN: [id number] DOB: Oct 09, 1965 Today's Date: 01/16/2022   History of present illness Patient is a 56 y.o.  female with history of bipolar disorder-who was hospitalized at [facility] received a Ativan challenge for possible catatonia-with minimal improvement-due to persistent altered mental status/tachycardia-patient was admitted to [facility] service for further evaluation and treatment.   OT comments  Patient received in supine and agreeable to OT session. Patient was supervision to get to EOB and min guard to stand from EOB.  Patient ambulated to bathroom with supervision and able to perform toilet hygiene. Patient stood at sink for hand hygiene and oral care. Patient returned to EOB and was supervision to change gown. Patient attempted to go back to supine but demonstrated poor safety and required cues to prevent fall from bed. Patient returned to supine and asked to use bathroom again. Patient continues to be limited by poor safety awareness. Acute OT to continue to follow.    Recommendations for follow up therapy are one component of a multi-disciplinary discharge planning process, led by the attending physician.  Recommendations may be updated based on patient status, additional functional criteria and insurance authorization.    Follow Up Recommendations  Outpatient OT    Assistance Recommended at Discharge Frequent or constant Supervision/Assistance  Patient can return home with the following  A little help with bathing/dressing/bathroom;Assistance with cooking/housework;Direct supervision/assist for medications management;Direct supervision/assist for financial management;Assist for transportation;A little help with walking and/or transfers;Help with stairs or ramp for entrance   Equipment Recommendations  None recommended by OT    Recommendations for Other Services      Precautions / Restrictions Precautions Precautions:  Fall Precaution Comments: impulsive Restrictions Weight Bearing Restrictions: No       Mobility Bed Mobility Overal bed mobility: Needs Assistance Bed Mobility: Supine to Sit, Sit to Supine     Supine to sit: Supervision Sit to supine: Supervision   General bed mobility comments: cues for safety with patient demonstrating poor safety with sit to supine    Transfers Overall transfer level: Needs assistance Equipment used: None Transfers: Sit to/from Stand Sit to Stand: Min guard, Supervision           General transfer comment: min guard to supervision for transfers     Balance Overall balance assessment: Needs assistance Sitting-balance support: Feet supported Sitting balance-Leahy Scale: Good     Standing balance support: No upper extremity supported Standing balance-Leahy Scale: Fair Standing balance comment: able to stand without UE support                           ADL either performed or assessed with clinical judgement   ADL Overall ADL's : Needs assistance/impaired     Grooming: Wash/dry hands;Wash/dry face;Oral care;Supervision/safety;Cueing for sequencing;Standing Grooming Details (indicate cue type and reason): standing at sink         Upper Body Dressing : Supervision/safety;Sitting;Standing Upper Body Dressing Details (indicate cue type and reason): able to thread arms in gown seated and stood to tie back     Toilet Transfer: Supervision/safety;Ambulation;Regular Teacher, adult education Details (indicate cue type and reason): performed toilet transfer x2 during visit Toileting- Clothing Manipulation and Hygiene: Supervision/safety;Sitting/lateral lean         General ADL Comments: Patient limited by poor safety awareness    Extremity/Trunk Assessment              Vision  Perception     Praxis      Cognition Arousal/Alertness: Awake/alert Behavior During Therapy: Flat affect, Impulsive Overall Cognitive  Status: No family/caregiver present to determine baseline cognitive functioning Area of Impairment: Awareness, Safety/judgement, Attention                 Orientation Level: Situation, Time Current Attention Level: Selective Memory: Decreased short-term memory, Decreased recall of precautions Following Commands: Follows one step commands with increased time Safety/Judgement: Decreased awareness of safety, Decreased awareness of deficits Awareness: Intellectual Problem Solving: Slow processing, Requires verbal cues, Requires tactile cues General Comments: frequent cues for safety        Exercises      Shoulder Instructions       General Comments      Pertinent Vitals/ Pain       Pain Assessment Pain Assessment: No/denies pain Faces Pain Scale: No hurt  Home Living                                          Prior Functioning/Environment              Frequency  Min 2X/week        Progress Toward Goals  OT Goals(current goals can now be found in the care plan section)  Progress towards OT goals: Progressing toward goals  Acute Rehab OT Goals Patient Stated Goal: get better OT Goal Formulation: With patient Time For Goal Achievement: 01/24/22 Potential to Achieve Goals: Good ADL Goals Pt Will Perform Grooming: Independently;standing Pt Will Perform Upper Body Dressing: Independently;sitting Pt Will Perform Lower Body Dressing: Independently;sit to/from stand Additional ADL Goal #1: Pt will indep complete IADL medication management task Additional ADL Goal #2: pt will follow 2-3 step commands 50% of the session to complete functional tasks  Plan Discharge plan remains appropriate    Co-evaluation                 AM-PAC OT "6 Clicks" Daily Activity     Outcome Measure   Help from another person eating meals?: A Little Help from another person taking care of personal grooming?: A Little Help from another person toileting,  which includes using toliet, bedpan, or urinal?: A Little Help from another person bathing (including washing, rinsing, drying)?: A Little Help from another person to put on and taking off regular upper body clothing?: A Little Help from another person to put on and taking off regular lower body clothing?: A Little 6 Click Score: 18    End of Session    OT Visit Diagnosis: Muscle weakness (generalized) (M62.81);History of falling (Z91.81);Other symptoms and signs involving cognitive function   Activity Tolerance     Patient Left     Nurse Communication          Time: 1000-1027 OT Time Calculation (min): 27 min  Charges: OT General Charges $OT Visit: 1 Visit OT Treatments $Self Care/Home Management : 23-37 mins  Lodema Hong, [place]  Office [phone]   Trixie Dredge 01/16/2022, 12:08 PM

## 2022-01-16 NOTE — Progress Notes (Signed)
Physical Therapy Treatment Patient Details Name: Natalie Brown MRN: [id number] DOB: 06/24/1965 Today's Date: 01/16/2022   History of Present Illness Patient is a 56 y.o.  female with history of bipolar disorder-who was hospitalized at [facility] received a Ativan challenge for possible catatonia-with minimal improvement-due to persistent altered mental status/tachycardia-patient was admitted to [facility] service for further evaluation and treatment.    PT Comments    Pt slowly, but steadily progressing toward goals, still limited by very slow processing.  Emphasis on following commands with few cues, clarifying when necessary, transitioning, sit to stand safety and stability and  progression of gait stability/stamina in the hall.  Goals updated.    Recommendations for follow up therapy are one component of a multi-disciplinary discharge planning process, led by the attending physician.  Recommendations may be updated based on patient status, additional functional criteria and insurance authorization.  Follow Up Recommendations  No PT follow up     Assistance Recommended at Discharge Intermittent Supervision/Assistance  Patient can return home with the following Direct supervision/assist for medications management;A little help with bathing/dressing/bathroom;Assistance with cooking/housework;Assist for transportation   Equipment Recommendations  None recommended by PT    Recommendations for Other Services       Precautions / Restrictions Precautions Precautions: Fall     Mobility  Bed Mobility Overal bed mobility: Needs Assistance Bed Mobility: Supine to Sit, Sit to Supine     Supine to sit: Supervision Sit to supine: Supervision   General bed mobility comments: slow processing, slow movement, trouble dividing attension, poor safety    Transfers Overall transfer level: Needs assistance Equipment used: None Transfers: Sit to/from Stand Sit to Stand: Min guard, Supervision  (onto safety padding)           General transfer comment: supervision to min guard    Ambulation/Gait Ambulation/Gait assistance: Min assist Gait Distance (Feet): 260 Feet Assistive device: 1 person hand held assist Gait Pattern/deviations: Step-through pattern, Decreased stride length Gait velocity: reduced Gait velocity interpretation: <1.8 ft/sec, indicate of risk for recurrent falls   General Gait Details: min stabiity assist, mildly more unsteady when asked to scan   Stairs             Wheelchair Mobility    Modified Rankin (Stroke Patients Only)       Balance     Sitting balance-Leahy Scale: Good     Standing balance support: No upper extremity supported Standing balance-Leahy Scale: Fair Standing balance comment: able to stand without UE support                            Cognition Arousal/Alertness: Awake/alert Behavior During Therapy: Flat affect, Impulsive Overall Cognitive Status: Impaired/Different from baseline                   Orientation Level: Situation Current Attention Level: Selective Memory: Decreased short-term memory, Decreased recall of precautions Following Commands: Follows one step commands with increased time Safety/Judgement: Decreased awareness of safety, Decreased awareness of deficits Awareness: Intellectual Problem Solving: Slow processing, Requires verbal cues, Requires tactile cues          Exercises      General Comments General comments (skin integrity, edema, etc.): pt's slowed processing had her preparing for back to bed long before she decided she needed to [facility] then have a stool at a separate time.      Pertinent Vitals/Pain Pain Assessment Pain Assessment: No/denies pain Faces Pain Scale: No  hurt Pain Intervention(s): Monitored during session    Home Living                          Prior Function            PT Goals (current goals can now be found in the care plan  section) Acute Rehab PT Goals PT Goal Formulation: With patient Time For Goal Achievement: 01/30/22 Potential to Achieve Goals: Fair Progress towards PT goals: Progressing toward goals (goals updated)    Frequency    Min 3X/week      PT Plan Current plan remains appropriate    Co-evaluation              AM-PAC PT "6 Clicks" Mobility   Outcome Measure  Help needed turning from your back to your side while in a flat bed without using bedrails?: A Little Help needed moving from lying on your back to sitting on the side of a flat bed without using bedrails?: A Little Help needed moving to and from a bed to a chair (including a wheelchair)?: A Little Help needed standing up from a chair using your arms (e.g., wheelchair or bedside chair)?: A Little Help needed to walk in hospital room?: A Little Help needed climbing 3-5 steps with a railing? : A Little 6 Click Score: 18    End of Session   Activity Tolerance: Patient tolerated treatment well Patient left: in bed;with call bell/phone within reach;with bed alarm set;Other (comment) Nurse Communication: Mobility status PT Visit Diagnosis: Other abnormalities of gait and mobility (R26.89)     Time: [id number] PT Time Calculation (min) (ACUTE ONLY): 29 min  Charges:  $Gait Training: 8-22 mins $Therapeutic Activity: 8-22 mins                     01/16/2022  Natalie Brown., PT Acute Rehabilitation Services [phone]  (office)   Natalie Brown 01/16/2022, 4:10 PM

## 2022-01-16 NOTE — Progress Notes (Signed)
PROGRESS NOTE        PATIENT DETAILS Name: Natalie Brown Age: 56 y.o. Sex: female Date of Birth: [birth year] Admit Date: 12/24/2021 Admitting Physician Evalee Mutton Kristeen Mans, MD [id number], [name], NP  Brief Summary: Patient is a 56 y.o.  female with history of bipolar disorder-who was hospitalized at [facility] received a Ativan challenge for possible catatonia-with minimal improvement-due to persistent altered mental status/tachycardia-patient was admitted to [facility] service for further evaluation and treatment.  No significant finding regarding her tachycardia work-up, 2D echo within normal limit, no significant electrolyte abnormalities.  Evaluated by psychiatry-and unfortunately-patient did not meet criteria to go back to [facility].  While awaiting appropriate disposition-small course complicated by acute [id number] infection.  Significant events: 9/4>> transferred to [facility]-from [facility]-Altered-no improvement after Ativan challenge. 9/22>> febrile 9/23>> COVID-19 positive  Significant studies: 9/04>> vitamin B12: 290 9/04>> CXR: No obvious pneumonia 9/04>> MRI brain: No acute intracranial abnormality.  Advanced cerebral/cerebellar atrophy 9/04-9/5>> LTM EEG: No seizures. 9/05>> TSH: 1.3 9/5-9/6>> LTM EEG: Negative. 9/06>> CTA chest: No PE-no PNA. 9/10>> bilateral lower extremity Doppler: No DVT 9/19>> Echo: EF 60-65%. 9/23>> CXR: No PNA 9/25>> CT head: No acute intracranial process  Significant microbiology data: 9/23>> COVID PCR positive 9/23>> blood culture: Negative  Procedures: None   Consults: Neurology, psychiatry  Subjective:   Confused-but answers simple questions appropriately-follow simple commands.  Objective: Vitals: Blood pressure (!) 96/54, pulse 81, temperature 98.2 F (36.8 C), temperature source Oral, resp. rate 18, SpO2 96 %.   Exam: Gen Exam:not in any distress HEENT:atraumatic, normocephalic Chest: B/L clear to auscultation  anteriorly CVS:S1S2 regular Abdomen:soft non tender, non distended Extremities:no edema Neurology: Non focal Skin: no rash    Assessment/Plan: Acute encephalopathy Dementia Thought to have undiagnosed dementia with significant cerebral/cerebellar atrophy on imaging studies.  No evidence of seizures-no further recommendations from neurology/psychiatry.  Elevated D-dimer  CTA chest/lower extremity Dopplers negative for VTE.  COVID-19 infection:  Afebrile overnight-no clinical signs of pneumonia.  On room air.  Continue to monitor off treatment.  Dementia:  Significant atrophy on neuroimaging-chronic finding-neurology suspects patient has progressive dementia.  TSH/RPR stable-B12 being supplemented.  Await SNF bed , [name] cleared by psych for placement.  Vitamin B12 deficiency:  Continue supplementation.  Bipolar disorder:  Appreciate psych input-continue Haldol .  Per psych requires SNF/memory care.  Sinus tachycardia  Resolved-unclear etiology but thought to be due to dehydration.    Tobacco abuse: Continue transdermal nicotine  BMI: Estimated body mass index is 25.62 kg/m as calculated from the following:   Height as of an earlier encounter on 12/24/21: 5\' 6"  (1.676 m).   Weight as of an earlier encounter on 12/24/21: 72 kg.   Code status:   Code Status: Prior   DVT Prophylaxis: enoxaparin (LOVENOX) injection 40 mg Start: 12/26/21 1200    Family Communication: None at bedside   Disposition Plan: Status is: Inpt Planned Discharge Destination: Memory care/SNF-awaiting bed.  Remains medically stable for transfer.  Diet: Diet Order             Diet regular Room service appropriate? No; Fluid consistency: Thin  Diet effective now                    MEDICATIONS: Scheduled Meds:  atropine  1 drop Sublingual TID   vitamin B-12  1,000 mcg Oral Daily  docusate sodium  200 mg Oral BID   enoxaparin (LOVENOX) injection  40 mg Subcutaneous Q24H   feeding  supplement (KATE FARMS STANDARD 1.4)  325 mL Oral BID BM   haloperidol  1 mg Oral Daily   And   haloperidol  2 mg Oral QHS   hydrocerin   Topical BID   metoprolol tartrate  25 mg Oral BID   nicotine  14 mg Transdermal Daily   polyethylene glycol  17 g Oral BID   scopolamine  1 patch Transdermal Q72H   Continuous Infusions: PRN Meds:.acetaminophen, alum & mag hydroxide-simeth, fluticasone, hydrOXYzine, LORazepam, magnesium hydroxide, metoprolol tartrate, OLANZapine zydis **AND** ziprasidone, ondansetron, mouth rinse   I have personally reviewed following labs and imaging studies  LABORATORY DATA:  Recent Labs  Lab 01/12/22 0828  WBC 5.5  HGB 12.8  HCT 37.4  PLT 274  MCV 88.8  MCH 30.4  MCHC 34.2  RDW 13.1     Recent Labs  Lab 01/12/22 0828 01/12/22 1136 01/13/22 0645 01/14/22 1328  NA 140  --   --  140  K 4.5  --   --  4.5  CL 108  --   --  107  CO2 23  --   --  25  GLUCOSE 98  --   --  93  BUN 5*  --   --  14  CREATININE 1.17*  --   --  1.14*  CALCIUM 8.9  --   --  9.1  AST  --   --   --  23  ALT  --   --   --  18  ALKPHOS  --   --   --  47  BILITOT  --   --   --  0.6  ALBUMIN  --   --   --  3.0*  PROCALCITON <0.10  --  0.12  --   LATICACIDVEN <0.3* 3.7*  --  0.9  AMMONIA  --   --   --  34    Lab Results  Component Value Date   TSH 1.468 01/09/2022    RADIOLOGY STUDIES/RESULTS: CT HEAD WO CONTRAST (5MM)  Result Date: 01/14/2022 CLINICAL DATA:  Lethargy EXAM: CT HEAD WITHOUT CONTRAST TECHNIQUE: Contiguous axial images were obtained from the base of the skull through the vertex without intravenous contrast. RADIATION DOSE REDUCTION: This exam was performed according to the departmental dose-optimization program which includes automated exposure control, adjustment of the mA and/or kV according to patient size and/or use of iterative reconstruction technique. COMPARISON:  10/09/2021 CT head FINDINGS: Brain: No evidence of acute infarction, hemorrhage,  mass, mass effect, or midline shift. No hydrocephalus or extra-axial fluid collection. Mildly advanced cerebral volume loss for age. Vascular: No hyperdense vessel. Skull: Normal. Negative for fracture or focal lesion. Sinuses/Orbits: Mucosal thickening in the right ethmoid air cells. The orbits are unremarkable. Other: The mastoid air cells are well aerated. IMPRESSION: No acute intracranial process. Electronically Signed   By: Merilyn Baba M.D.   On: 01/14/2022 19:33     LOS: 21 days   Signature  Oren Binet M.D on 01/16/2022 at 2:46 PM   -  To page go to [email or web address]

## 2022-01-16 NOTE — Progress Notes (Signed)
Mobility Specialist Progress Note:   01/16/22 0950  Mobility  Activity Ambulated with assistance in room  Level of Assistance Contact guard assist, steadying assist  Assistive Device None  Distance Ambulated (ft) 100 ft  Activity Response Tolerated well  $Mobility charge 1 Mobility   Pt eager for mobility session. Required steadying assist throughout ambulation. Pt back in bed with all needs met, bed alarm on.   Nelta Numbers Acute Rehab Secure Chat or Office Phone: [phone]

## 2022-01-16 NOTE — Plan of Care (Signed)

## 2022-01-17 LAB — CULTURE, BLOOD (ROUTINE X 2)
Culture: NO GROWTH
Culture: NO GROWTH

## 2022-01-17 NOTE — Progress Notes (Signed)
PROGRESS NOTE        PATIENT DETAILS Name: Natalie Brown Age: 56 y.o. Sex: female Date of Birth: December 31, 1965 Admit Date: 12/24/2021 Admitting Physician Evalee Mutton Kristeen Mans, MD [id number], [name], NP  Brief Summary: Patient is a 56 y.o.  female with history of bipolar disorder-who was hospitalized at [facility] received a Ativan challenge for possible catatonia-with minimal improvement-due to persistent altered mental status/tachycardia-patient was admitted to [facility] service for further evaluation and treatment.  No significant finding regarding her tachycardia work-up, 2D echo within normal limit, no significant electrolyte abnormalities.  Evaluated by psychiatry-and unfortunately-patient did not meet criteria to go back to [facility].  While awaiting appropriate disposition-small course complicated by acute [id number] infection.  Significant events: 9/4>> transferred to [facility]-from [facility]-Altered-no improvement after Ativan challenge. 9/22>> febrile 9/23>> COVID-19 positive  Significant studies: 9/04>> vitamin B12: 290 9/04>> CXR: No obvious pneumonia 9/04>> MRI brain: No acute intracranial abnormality.  Advanced cerebral/cerebellar atrophy 9/04-9/5>> LTM EEG: No seizures. 9/05>> TSH: 1.3 9/5-9/6>> LTM EEG: Negative. 9/06>> CTA chest: No PE-no PNA. 9/10>> bilateral lower extremity Doppler: No DVT 9/19>> Echo: EF 60-65%. 9/23>> CXR: No PNA 9/25>> CT head: No acute intracranial process  Significant microbiology data: 9/23>> COVID PCR positive 9/23>> blood culture: Negative  Procedures: None   Consults: Neurology, psychiatry  Subjective:   Remains pleasantly confused-no major issues overnight.  Unchanged over the past several days.  Objective: Vitals: Blood pressure 100/69, pulse 62, temperature 97.9 F (36.6 C), temperature source Oral, resp. rate 20, SpO2 97 %.   Exam: Gen Exam:not in any distress HEENT:atraumatic, normocephalic Chest: B/L clear to  auscultation anteriorly CVS:S1S2 regular Abdomen:soft non tender, non distended Extremities:no edema Neurology: Non focal Skin: no rash    Assessment/Plan: Acute encephalopathy Dementia Thought to have undiagnosed dementia with significant cerebral/cerebellar atrophy on imaging studies.  No evidence of seizures-no further recommendations from neurology/psychiatry.  Elevated D-dimer  CTA chest/lower extremity Dopplers negative for VTE.  COVID-19 infection:  Afebrile overnight-no clinical signs of pneumonia.  On room air.  Continue to monitor off treatment.  Dementia:  Significant atrophy on neuroimaging-chronic finding-neurology suspects patient has progressive dementia.  TSH/RPR stable-B12 being supplemented.  Await SNF bed , [name] cleared by psych for placement.  Vitamin B12 deficiency:  Continue supplementation.  Bipolar disorder:  Appreciate psych input-continue Haldol .  Per psych requires SNF/memory care.  Sinus tachycardia  Resolved-unclear etiology but thought to be due to dehydration.    Tobacco abuse: Continue transdermal nicotine  BMI: Estimated body mass index is 25.62 kg/m as calculated from the following:   Height as of an earlier encounter on 12/24/21: 5\' 6"  (1.676 m).   Weight as of an earlier encounter on 12/24/21: 72 kg.   Code status:   Code Status: Prior   DVT Prophylaxis: enoxaparin (LOVENOX) injection 40 mg Start: 12/26/21 1200    Family Communication: None at bedside   Disposition Plan: Status is: Inpt Planned Discharge Destination: Memory care/SNF-awaiting bed.  Remains medically stable for transfer.  Diet: Diet Order             Diet regular Room service appropriate? No; Fluid consistency: Thin  Diet effective now                    MEDICATIONS: Scheduled Meds:  atropine  1 drop Sublingual TID   vitamin B-12  1,000  mcg Oral Daily   docusate sodium  200 mg Oral BID   enoxaparin (LOVENOX) injection  40 mg Subcutaneous Q24H    feeding supplement (KATE FARMS STANDARD 1.4)  325 mL Oral BID BM   haloperidol  1 mg Oral Daily   And   haloperidol  2 mg Oral QHS   hydrocerin   Topical BID   metoprolol tartrate  25 mg Oral BID   nicotine  14 mg Transdermal Daily   polyethylene glycol  17 g Oral BID   scopolamine  1 patch Transdermal Q72H   Continuous Infusions: PRN Meds:.acetaminophen, alum & mag hydroxide-simeth, fluticasone, hydrOXYzine, LORazepam, magnesium hydroxide, metoprolol tartrate, OLANZapine zydis **AND** ziprasidone, ondansetron, mouth rinse   I have personally reviewed following labs and imaging studies  LABORATORY DATA:  Recent Labs  Lab 01/12/22 0828  WBC 5.5  HGB 12.8  HCT 37.4  PLT 274  MCV 88.8  MCH 30.4  MCHC 34.2  RDW 13.1     Recent Labs  Lab 01/12/22 0828 01/12/22 1136 01/13/22 0645 01/14/22 1328  NA 140  --   --  140  K 4.5  --   --  4.5  CL 108  --   --  107  CO2 23  --   --  25  GLUCOSE 98  --   --  93  BUN 5*  --   --  14  CREATININE 1.17*  --   --  1.14*  CALCIUM 8.9  --   --  9.1  AST  --   --   --  23  ALT  --   --   --  18  ALKPHOS  --   --   --  47  BILITOT  --   --   --  0.6  ALBUMIN  --   --   --  3.0*  PROCALCITON <0.10  --  0.12  --   LATICACIDVEN <0.3* 3.7*  --  0.9  AMMONIA  --   --   --  34    Lab Results  Component Value Date   TSH 1.468 01/09/2022    RADIOLOGY STUDIES/RESULTS: No results found.   LOS: 22 days   Signature  Oren Binet M.D on 01/17/2022 at 12:29 PM   -  To page go to [email or web address]

## 2022-01-18 NOTE — TOC Progression Note (Signed)
Transition of Care [facility]) - Progression Note    Patient Details  Name: Natalie Brown MRN: [id number] Date of Birth: 09/10/1965  Transition of Care [facility]) CM/SW [place], LCSW Phone Number: 01/18/2022, 4:03 PM  Clinical Narrative:    CSW received call from patient's mother and provided update.      Barriers to Discharge: Financial Resources, Inadequate or no insurance, Homeless with medical needs  Expected Discharge Plan and Services   In-house Referral: Clinical Social Work   Post Acute Care Choice: Nursing Home Living arrangements for the past 2 months: Homeless [facility]) Expected Discharge Date: 12/24/21                                     Social Determinants of Health (SDOH) Interventions    Readmission Risk Interventions     No data to display

## 2022-01-18 NOTE — Progress Notes (Signed)
   01/18/22 1627  Assess: MEWS Score  Temp 98.5 F (36.9 C)  BP 120/81  MAP (mmHg) 87  Pulse Rate (!) 116  Resp 18  SpO2 100 %  O2 Device Room Air  Assess: MEWS Score  MEWS Temp 0  MEWS Systolic 0  MEWS Pulse 2  MEWS RR 0  MEWS LOC 0  MEWS Score 2  MEWS Score Color Yellow  Assess: if the MEWS score is Yellow or Red  Were vital signs taken at a resting state? Yes  Focused Assessment No change from prior assessment  Does the patient meet 2 or more of the SIRS criteria? Yes  Does the patient have a confirmed or suspected source of infection? No  Provider and Rapid Response Notified? No  MEWS guidelines implemented *See Row Information* Yes  Treat  MEWS Interventions Escalated (See documentation below)  Take Vital Signs  Increase Vital Sign Frequency  Yellow: Q 2hr X 2 then Q 4hr X 2, if remains yellow, continue Q 4hrs  Escalate  MEWS: Escalate Yellow: discuss with charge nurse/RN and consider discussing with provider and RRT  Notify: Charge Nurse/RN  Name of Charge Nurse/RN Notified Erin  Date Charge Nurse/RN Notified 01/18/22  Time Charge Nurse/RN Notified 1725  Document  Progress note created (see row info) Yes  Assess: SIRS CRITERIA  SIRS Temperature  0  SIRS Pulse 1  SIRS Respirations  0  SIRS WBC 1  SIRS Score Sum  2

## 2022-01-18 NOTE — Progress Notes (Signed)
Mobility Specialist Progress Note:   01/18/22 1030  Mobility  Activity Ambulated independently to bathroom;Ambulated with assistance to bathroom  Level of Assistance Standby assist, set-up cues, supervision of patient - no hands on  Assistive Device None  Distance Ambulated (ft) 30 ft  Activity Response Tolerated well  $Mobility charge 1 Mobility   Pt requesting to go to BR upon arrival. No physical assistance required, only supervision for safety. Pt back in bed with all needs met, eating breakfast.   [place] or Office Phone: [phone]

## 2022-01-18 NOTE — TOC Progression Note (Signed)
Transition of Care [facility]) - Progression Note    Patient Details  Name: Natalie Brown MRN: [id number] Date of Birth: 1965-05-12  [facility] of [facility] CM/SW [place], LCSW Phone Number: 01/18/2022, 9:12 AM  Clinical Narrative:    Continues with no placement offers.      Barriers to Discharge: Financial Resources, Inadequate or no insurance, Homeless with medical needs  Expected Discharge Plan and Services   In-house Referral: Clinical Social Work   Post Acute Care Choice: Nursing Home Living arrangements for the past 2 months: Homeless [facility]) Expected Discharge Date: 12/24/21                                     Social Determinants of Health (SDOH) Interventions    Readmission Risk Interventions     No data to display

## 2022-01-18 NOTE — Progress Notes (Signed)
Physical Therapy Treatment Patient Details Name: Natalie Brown MRN: [id number] DOB: 1965-08-17 Today's Date: 01/18/2022   History of Present Illness Patient is a 56 y.o.  female with history of bipolar disorder-who was hospitalized at [facility] received a Ativan challenge for possible catatonia-with minimal improvement-due to persistent altered mental status/tachycardia-patient was admitted to [facility] service for further evaluation and treatment.    PT Comments    Pt stating "I have to go to the bathroom" as soon as PT entered room. Pt returned to bed multiple times during session, but then stated "I would love to walk with you" and attempting to get up again. Pt overall requiring light physical assist for mobility at this time, intermittent HHA helpful for pt balance. Pt's mother present at end of session, PT to continue to follow acutely.      Recommendations for follow up therapy are one component of a multi-disciplinary discharge planning process, led by the attending physician.  Recommendations may be updated based on patient status, additional functional criteria and insurance authorization.  Follow Up Recommendations  No PT follow up     Assistance Recommended at Discharge Intermittent Supervision/Assistance  Patient can return home with the following Direct supervision/assist for medications management;A little help with bathing/dressing/bathroom;Assistance with cooking/housework;Assist for transportation   Equipment Recommendations  None recommended by PT    Recommendations for Other Services       Precautions / Restrictions Precautions Precautions: Fall Precaution Comments: impulsive Restrictions Weight Bearing Restrictions: No     Mobility  Bed Mobility Overal bed mobility: Needs Assistance Bed Mobility: Supine to Sit, Sit to Supine     Supine to sit: Min assist Sit to supine: Min assist   General bed mobility comments: assist for trunk elevation and LE  progression back into bed. supine<>sit x3, pt putting self back to bed multiple times requiring cuing and increased time to continue on with session    Transfers Overall transfer level: Needs assistance Equipment used: 1 person hand held assist Transfers: Sit to/from Stand Sit to Stand: Min assist           General transfer comment: light rise and steady assist, STS x3 from EOB    Ambulation/Gait Ambulation/Gait assistance: Min assist Gait Distance (Feet): 250 Feet Assistive device: 1 person hand held assist Gait Pattern/deviations: Step-through pattern, Decreased stride length Gait velocity: decr     General Gait Details: min steadying assist via HHA, 1x150 and 1x100 ft gait, with seated rest in between.   Stairs             Wheelchair Mobility    Modified Rankin (Stroke Patients Only)       Balance Overall balance assessment: Needs assistance Sitting-balance support: Feet supported Sitting balance-Leahy Scale: Good     Standing balance support: No upper extremity supported Standing balance-Leahy Scale: Fair Standing balance comment: able to stand without UE support                            Cognition Arousal/Alertness: Awake/alert Behavior During Therapy: Flat affect, Impulsive Overall Cognitive Status: Impaired/Different from baseline                     Current Attention Level: Sustained Memory: Decreased short-term memory, Decreased recall of precautions Following Commands: Follows one step commands with increased time Safety/Judgement: Decreased awareness of safety, Decreased awareness of deficits Awareness: Intellectual Problem Solving: Slow processing, Requires verbal cues, Requires tactile cues  Exercises      General Comments        Pertinent Vitals/Pain Pain Assessment Pain Assessment: No/denies pain    Home Living                          Prior Function            PT Goals  (current goals can now be found in the care plan section) Acute Rehab PT Goals PT Goal Formulation: With patient Time For Goal Achievement: 01/30/22 Potential to Achieve Goals: Fair Progress towards PT goals: Progressing toward goals    Frequency    Min 3X/week      PT Plan Current plan remains appropriate    Co-evaluation              AM-PAC PT "6 Clicks" Mobility   Outcome Measure  Help needed turning from your back to your side while in a flat bed without using bedrails?: A Little Help needed moving from lying on your back to sitting on the side of a flat bed without using bedrails?: A Little Help needed moving to and from a bed to a chair (including a wheelchair)?: A Little Help needed standing up from a chair using your arms (e.g., wheelchair or bedside chair)?: A Little Help needed to walk in hospital room?: A Little Help needed climbing 3-5 steps with a railing? : A Little 6 Click Score: 18    End of Session   Activity Tolerance: Patient tolerated treatment well Patient left: in bed;with call bell/phone within reach;with bed alarm set;with family/visitor present (mother at bedside, provided with airborne and contact PPE. Pt's mother agreeable to duckbill N95 and face shield) Nurse Communication: Mobility status PT Visit Diagnosis: Other abnormalities of gait and mobility (R26.89)     Time: 5732-2025 PT Time Calculation (min) (ACUTE ONLY): 23 min  Charges:  $Gait Training: 8-22 mins $Therapeutic Activity: 8-22 mins                    Stacie Glaze, PT DPT Acute Rehabilitation Services Pager [phone]  Office [phone]   [name] 01/18/2022, 4:35 PM

## 2022-01-18 NOTE — Progress Notes (Signed)
PROGRESS NOTE        PATIENT DETAILS Name: Natalie Brown Age: 56 y.o. Sex: female Date of Birth: 1965-04-30 Admit Date: 12/24/2021 Admitting Physician Evalee Mutton Kristeen Mans, MD [id number], [name], NP  Brief Summary: Patient is a 56 y.o.  female with history of bipolar disorder-who was hospitalized at [facility] received a Ativan challenge for possible catatonia-with minimal improvement-due to persistent altered mental status/tachycardia-patient was admitted to [facility] service for further evaluation and treatment.  No significant finding regarding her tachycardia work-up, 2D echo within normal limit, no significant electrolyte abnormalities.  Evaluated by psychiatry-and unfortunately-patient did not meet criteria to go back to [facility].  While awaiting appropriate disposition-small course complicated by acute TDVVO-16 infection.  Significant events: 9/4>> transferred to [facility]-from [facility]-Altered-no improvement after Ativan challenge. 9/22>> febrile 9/23>> COVID-19 positive  Significant studies: 9/04>> vitamin B12: 290 9/04>> CXR: No obvious pneumonia 9/04>> MRI brain: No acute intracranial abnormality.  Advanced cerebral/cerebellar atrophy 9/04-9/5>> LTM EEG: No seizures. 9/05>> TSH: 1.3 9/5-9/6>> LTM EEG: Negative. 9/06>> CTA chest: No PE-no PNA. 9/10>> bilateral lower extremity Doppler: No DVT 9/19>> Echo: EF 60-65%. 9/23>> CXR: No PNA 9/25>> CT head: No acute intracranial process  Significant microbiology data: 9/23>> COVID PCR positive 9/23>> blood culture: Negative  Procedures: None   Consults: Neurology, psychiatry  Subjective:   No major issues overnight.  Objective: Vitals: Blood pressure 114/73, pulse 84, temperature 98.1 F (36.7 C), temperature source Oral, resp. rate 18, SpO2 98 %.   Exam: Gen Exam:not in any distress HEENT:atraumatic, normocephalic Chest: B/L clear to auscultation anteriorly CVS:S1S2 regular Abdomen:soft non tender, non  distended Extremities:no edema Neurology: Non focal Skin: no rash    Assessment/Plan: Acute encephalopathy Dementia Thought to have undiagnosed dementia with significant cerebral/cerebellar atrophy on imaging studies.  No evidence of seizures-no further recommendations from neurology/psychiatry.  Elevated D-dimer  CTA chest/lower extremity Dopplers negative for VTE.  COVID-19 infection:  Afebrile overnight-no clinical signs of pneumonia.  On room air.  Continue to monitor off treatment.  Dementia:  Significant atrophy on neuroimaging-chronic finding-neurology suspects patient has progressive dementia.  TSH/RPR stable-B12 being supplemented.  Await SNF bed , [name] cleared by psych for placement.  Vitamin B12 deficiency:  Continue supplementation.  Bipolar disorder:  Appreciate psych input-continue Haldol .  Per psych requires SNF/memory care.  Sinus tachycardia  Resolved-unclear etiology but thought to be due to dehydration.    Tobacco abuse: Continue transdermal nicotine  BMI: Estimated body mass index is 25.62 kg/m as calculated from the following:   Height as of an earlier encounter on 12/24/21: 5\' 6"  (1.676 m).   Weight as of an earlier encounter on 12/24/21: 72 kg.   Code status:   Code Status: Prior   DVT Prophylaxis: enoxaparin (LOVENOX) injection 40 mg Start: 12/26/21 1200    Family Communication: None at bedside   Disposition Plan: Status is: Inpt Planned Discharge Destination: Memory care/SNF-awaiting bed.  Remains medically stable for transfer.  Diet: Diet Order             Diet regular Room service appropriate? No; Fluid consistency: Thin  Diet effective now                    MEDICATIONS: Scheduled Meds:  atropine  1 drop Sublingual TID   vitamin B-12  1,000 mcg Oral Daily   docusate sodium  200  mg Oral BID   enoxaparin (LOVENOX) injection  40 mg Subcutaneous Q24H   feeding supplement (KATE FARMS STANDARD 1.4)  325 mL Oral BID BM    haloperidol  1 mg Oral Daily   And   haloperidol  2 mg Oral QHS   hydrocerin   Topical BID   metoprolol tartrate  25 mg Oral BID   nicotine  14 mg Transdermal Daily   polyethylene glycol  17 g Oral BID   scopolamine  1 patch Transdermal Q72H   Continuous Infusions: PRN Meds:.acetaminophen, alum & mag hydroxide-simeth, fluticasone, hydrOXYzine, LORazepam, magnesium hydroxide, metoprolol tartrate, OLANZapine zydis **AND** ziprasidone, ondansetron, mouth rinse   I have personally reviewed following labs and imaging studies  LABORATORY DATA:  Recent Labs  Lab 01/12/22 0828  WBC 5.5  HGB 12.8  HCT 37.4  PLT 274  MCV 88.8  MCH 30.4  MCHC 34.2  RDW 13.1     Recent Labs  Lab 01/12/22 0828 01/12/22 1136 01/13/22 0645 01/14/22 1328  NA 140  --   --  140  K 4.5  --   --  4.5  CL 108  --   --  107  CO2 23  --   --  25  GLUCOSE 98  --   --  93  BUN 5*  --   --  14  CREATININE 1.17*  --   --  1.14*  CALCIUM 8.9  --   --  9.1  AST  --   --   --  23  ALT  --   --   --  18  ALKPHOS  --   --   --  47  BILITOT  --   --   --  0.6  ALBUMIN  --   --   --  3.0*  PROCALCITON <0.10  --  0.12  --   LATICACIDVEN <0.3* 3.7*  --  0.9  AMMONIA  --   --   --  34    Lab Results  Component Value Date   TSH 1.468 01/09/2022    RADIOLOGY STUDIES/RESULTS: No results found.   LOS: 23 days   Signature  Jeoffrey Massed M.D on 01/18/2022 at 1:40 PM   -  To page go to [email or web address]

## 2022-01-19 NOTE — Progress Notes (Signed)
Mobility Specialist Progress Note:   01/19/22 1120  Mobility  Activity Ambulated with assistance in hallway  Level of Assistance Contact guard assist, steadying assist  Assistive Device Other (Comment) (HHA)  Distance Ambulated (ft) 200 ft  Activity Response Tolerated well  $Mobility charge 1 Mobility   Pt agreeable to mobility session. Few words spoken today throughout session. Required CGA through HHA, pt unsteady at times. Back in bed with all needs met, sitter present.   Nelta Numbers Acute Rehab Secure Chat or Office Phone: [phone]

## 2022-01-19 NOTE — Plan of Care (Signed)

## 2022-01-19 NOTE — Progress Notes (Signed)
PROGRESS NOTE        PATIENT DETAILS Name: Natalie Brown Age: 56 y.o. Sex: female Date of Birth: [birth year] Admit Date: 12/24/2021 Admitting Physician Evalee Mutton Kristeen Mans, MD [id number], [name], NP  Brief Summary: Patient is a 56 y.o.  female with history of bipolar disorder-who was hospitalized at [facility] received a Ativan challenge for possible catatonia-with minimal improvement-due to persistent altered mental status/tachycardia-patient was admitted to [facility] service for further evaluation and treatment.  No significant finding regarding her tachycardia work-up, 2D echo within normal limit, no significant electrolyte abnormalities.  Evaluated by psychiatry-and unfortunately-patient did not meet criteria to go back to [facility].  While awaiting appropriate disposition-small course complicated by acute [id number] infection.  Significant events: 9/4>> transferred to [facility]-from [facility]-Altered-no improvement after Ativan challenge. 9/22>> febrile 9/23>> COVID-19 positive  Significant studies: 9/04>> vitamin B12: 290 9/04>> CXR: No obvious pneumonia 9/04>> MRI brain: No acute intracranial abnormality.  Advanced cerebral/cerebellar atrophy 9/04-9/5>> LTM EEG: No seizures. 9/05>> TSH: 1.3 9/5-9/6>> LTM EEG: Negative. 9/06>> CTA chest: No PE-no PNA. 9/10>> bilateral lower extremity Doppler: No DVT 9/19>> Echo: EF 60-65%. 9/23>> CXR: No PNA 9/25>> CT head: No acute intracranial process  Significant microbiology data: 9/23>> COVID PCR positive 9/23>> blood culture: Negative  Procedures: None   Consults: Neurology, psychiatry  Subjective:   No issues overnight-appears unchanged.  Objective: Vitals: Blood pressure 119/84, pulse 99, temperature 98.4 F (36.9 C), temperature source Oral, resp. rate 20, SpO2 98 %.   Exam: Gen Exam:not in any distress HEENT:atraumatic, normocephalic Chest: B/L clear to auscultation anteriorly CVS:S1S2 regular Abdomen:soft non  tender, non distended Extremities:no edema Neurology: Non focal Skin: no rash    Assessment/Plan: Acute encephalopathy Dementia Thought to have undiagnosed dementia with significant cerebral/cerebellar atrophy on imaging studies.  No evidence of seizures-no further recommendations from neurology/psychiatry.  Elevated D-dimer  CTA chest/lower extremity Dopplers negative for VTE.  COVID-19 infection:  Afebrile overnight-no clinical signs of pneumonia.  On room air.  Continue to monitor off treatment.  Dementia:  Significant atrophy on neuroimaging-chronic finding-neurology suspects patient has progressive dementia.  TSH/RPR stable-B12 being supplemented.  Await SNF bed , [name] cleared by psych for placement.  Vitamin B12 deficiency:  Continue supplementation.  Bipolar disorder:  Appreciate psych input-continue Haldol .  Per psych requires SNF/memory care.  Sinus tachycardia  Resolved-unclear etiology but thought to be due to dehydration.    Tobacco abuse: Continue transdermal nicotine  BMI: Estimated body mass index is 25.62 kg/m as calculated from the following:   Height as of an earlier encounter on 12/24/21: 5\' 6"  (1.676 m).   Weight as of an earlier encounter on 12/24/21: 72 kg.   Code status:   Code Status: Prior   DVT Prophylaxis: enoxaparin (LOVENOX) injection 40 mg Start: 12/26/21 1200    Family Communication: None at bedside   Disposition Plan: Status is: Inpt Planned Discharge Destination: Memory care/SNF-awaiting bed.  Remains medically stable for transfer.  Diet: Diet Order             Diet regular Room service appropriate? No; Fluid consistency: Thin  Diet effective now                    MEDICATIONS: Scheduled Meds:  atropine  1 drop Sublingual TID   vitamin B-12  1,000 mcg Oral Daily   docusate sodium  200  mg Oral BID   enoxaparin (LOVENOX) injection  40 mg Subcutaneous Q24H   feeding supplement (KATE FARMS STANDARD 1.4)  325 mL Oral BID  BM   haloperidol  1 mg Oral Daily   And   haloperidol  2 mg Oral QHS   hydrocerin   Topical BID   metoprolol tartrate  25 mg Oral BID   nicotine  14 mg Transdermal Daily   polyethylene glycol  17 g Oral BID   scopolamine  1 patch Transdermal Q72H   Continuous Infusions: PRN Meds:.acetaminophen, alum & mag hydroxide-simeth, fluticasone, hydrOXYzine, LORazepam, magnesium hydroxide, metoprolol tartrate, OLANZapine zydis **AND** ziprasidone, ondansetron, mouth rinse   I have personally reviewed following labs and imaging studies  LABORATORY DATA:  No results for input(s): "WBC", "HGB", "HCT", "PLT", "MCV", "MCH", "MCHC", "RDW", "LYMPHSABS", "MONOABS", "EOSABS", "BASOSABS", "BANDABS" in the last 168 hours.  Invalid input(s): "NEUTRABS", "BANDSABD"   Recent Labs  Lab 01/13/22 0645 01/14/22 1328  NA  --  140  K  --  4.5  CL  --  107  CO2  --  25  GLUCOSE  --  93  BUN  --  14  CREATININE  --  1.14*  CALCIUM  --  9.1  AST  --  23  ALT  --  18  ALKPHOS  --  47  BILITOT  --  0.6  ALBUMIN  --  3.0*  PROCALCITON 0.12  --   LATICACIDVEN  --  0.9  AMMONIA  --  34    Lab Results  Component Value Date   TSH 1.468 01/09/2022    RADIOLOGY STUDIES/RESULTS: No results found.   LOS: 24 days   Signature  Oren Binet M.D on 01/19/2022 at 1:07 PM   -  To page go to [email or web address]

## 2022-01-19 NOTE — Progress Notes (Signed)
Pt remains a 1:1 safety sitter related to impulsiveness but hospital unable to provide. Pt monitored frequently throughout the shift.

## 2022-01-20 LAB — BASIC METABOLIC PANEL
Anion gap: 11 (ref 5–15)
BUN: 11 mg/dL (ref 6–20)
CO2: 23 mmol/L (ref 22–32)
Calcium: 9.5 mg/dL (ref 8.9–10.3)
Chloride: 105 mmol/L (ref 98–111)
Creatinine, Ser: 1.19 mg/dL — ABNORMAL HIGH (ref 0.44–1.00)
GFR, Estimated: 54 mL/min — ABNORMAL LOW (ref >60)
Glucose, Bld: 121 mg/dL — ABNORMAL HIGH (ref 70–99)
Potassium: 4.4 mmol/L (ref 3.5–5.1)
Sodium: 139 mmol/L (ref 135–145)

## 2022-01-20 LAB — CBC
HCT: 38.6 % (ref 36.0–46.0)
Hemoglobin: 13.3 g/dL (ref 12.0–15.0)
MCH: 30.2 pg (ref 26.0–34.0)
MCHC: 34.5 g/dL (ref 30.0–36.0)
MCV: 87.5 fL (ref 80.0–100.0)
Platelets: 381 10*3/uL (ref 150–400)
RBC: 4.41 MIL/uL (ref 3.87–5.11)
RDW: 12.8 % (ref 11.5–15.5)
WBC: 4.8 10*3/uL (ref 4.0–10.5)
nRBC: 0 % (ref 0.0–0.2)

## 2022-01-20 NOTE — Plan of Care (Signed)

## 2022-01-20 NOTE — Progress Notes (Signed)
PROGRESS NOTE        PATIENT DETAILS Name: Natalie Brown Age: 56 y.o. Sex: female Date of Birth: 07-18-1965 Admit Date: 12/24/2021 Admitting Physician Evalee Mutton Kristeen Mans, MD [id number], [name], NP  Brief Summary: Patient is a 56 y.o.  female with history of bipolar disorder-who was hospitalized at [facility] received a Ativan challenge for possible catatonia-with minimal improvement-due to persistent altered mental status/tachycardia-patient was admitted to [facility] service for further evaluation and treatment.  No significant finding regarding her tachycardia work-up, 2D echo within normal limit, no significant electrolyte abnormalities.  Evaluated by psychiatry-and unfortunately-patient did not meet criteria to go back to [facility].  While awaiting appropriate disposition-small course complicated by acute [id number]-08 infection.  Significant events: 9/4>> transferred to [facility]-from [facility]-Altered-no improvement after Ativan challenge. 9/22>> febrile 9/23>> COVID-19 positive  Significant studies: 9/04>> vitamin B12: 290 9/04>> CXR: No obvious pneumonia 9/04>> MRI brain: No acute intracranial abnormality.  Advanced cerebral/cerebellar atrophy 9/04-9/5>> LTM EEG: No seizures. 9/05>> TSH: 1.3 9/5-9/6>> LTM EEG: Negative. 9/06>> CTA chest: No PE-no PNA. 9/10>> bilateral lower extremity Doppler: No DVT 9/19>> Echo: EF 60-65%. 9/23>> CXR: No PNA 9/25>> CT head: No acute intracranial process  Significant microbiology data: 9/23>> COVID PCR positive 9/23>> blood culture: Negative  Procedures: None   Consults: Neurology, psychiatry  Subjective:   No major issues overnight-lying comfortably in bed.  Awaiting SNF.  Objective: Vitals: Blood pressure 116/66, pulse 77, temperature 98.2 F (36.8 C), temperature source Oral, resp. rate 20, SpO2 100 %.   Exam: Gen Exam:not in any distress HEENT:atraumatic, normocephalic Chest: B/L clear to auscultation anteriorly CVS:S1S2  regular Abdomen:soft non tender, non distended Extremities:no edema Neurology: Non focal Skin: no rash    Assessment/Plan: Acute encephalopathy Dementia Thought to have undiagnosed dementia with significant cerebral/cerebellar atrophy on imaging studies.  No evidence of seizures-no further recommendations from neurology/psychiatry.  Elevated D-dimer  CTA chest/lower extremity Dopplers negative for VTE.  COVID-19 infection:  Afebrile overnight-no clinical signs of pneumonia.  On room air.  Continue to monitor off treatment.  Dementia:  Significant atrophy on neuroimaging-chronic finding-neurology suspects patient has progressive dementia.  TSH/RPR stable-B12 being supplemented.  Await SNF bed , [name] cleared by psych for placement.  Vitamin B12 deficiency:  Continue supplementation.  Bipolar disorder:  Appreciate psych input-continue Haldol .  Per psych requires SNF/memory care.  Sinus tachycardia  Resolved-unclear etiology but thought to be due to dehydration.    Tobacco abuse: Continue transdermal nicotine  BMI: Estimated body mass index is 25.62 kg/m as calculated from the following:   Height as of an earlier encounter on 12/24/21: 5\' 6"  (1.676 m).   Weight as of an earlier encounter on 12/24/21: 72 kg.   Code status:   Code Status: Prior   DVT Prophylaxis: enoxaparin (LOVENOX) injection 40 mg Start: 12/26/21 1200    Family Communication: None at bedside   Disposition Plan: Status is: Inpt Planned Discharge Destination: Memory care/SNF-awaiting bed.  Remains medically stable for transfer.  Diet: Diet Order             Diet regular Room service appropriate? No; Fluid consistency: Thin  Diet effective now                    MEDICATIONS: Scheduled Meds:  atropine  1 drop Sublingual TID   vitamin B-12  1,000 mcg Oral Daily  docusate sodium  200 mg Oral BID   enoxaparin (LOVENOX) injection  40 mg Subcutaneous Q24H   feeding supplement (KATE FARMS  STANDARD 1.4)  325 mL Oral BID BM   haloperidol  1 mg Oral Daily   And   haloperidol  2 mg Oral QHS   hydrocerin   Topical BID   metoprolol tartrate  25 mg Oral BID   nicotine  14 mg Transdermal Daily   polyethylene glycol  17 g Oral BID   scopolamine  1 patch Transdermal Q72H   Continuous Infusions: PRN Meds:.acetaminophen, alum & mag hydroxide-simeth, fluticasone, hydrOXYzine, LORazepam, magnesium hydroxide, metoprolol tartrate, OLANZapine zydis **AND** ziprasidone, ondansetron, mouth rinse   I have personally reviewed following labs and imaging studies  LABORATORY DATA:  No results for input(s): "WBC", "HGB", "HCT", "PLT", "MCV", "MCH", "MCHC", "RDW", "LYMPHSABS", "MONOABS", "EOSABS", "BASOSABS", "BANDABS" in the last 168 hours.  Invalid input(s): "NEUTRABS", "BANDSABD"   Recent Labs  Lab 01/14/22 1328  NA 140  K 4.5  CL 107  CO2 25  GLUCOSE 93  BUN 14  CREATININE 1.14*  CALCIUM 9.1  AST 23  ALT 18  ALKPHOS 47  BILITOT 0.6  ALBUMIN 3.0*  LATICACIDVEN 0.9  AMMONIA 34    Lab Results  Component Value Date   TSH 1.468 01/09/2022    RADIOLOGY STUDIES/RESULTS: No results found.   LOS: 25 days   Signature  Jeoffrey Massed M.D on 01/20/2022 at 11:04 AM   -  To page go to [email or web address]

## 2022-01-21 NOTE — Progress Notes (Signed)
PROGRESS NOTE        PATIENT DETAILS Name: Natalie Brown Age: 56 y.o. Sex: female Date of Birth: 1965-12-14 Admit Date: 12/24/2021 Admitting Physician Dewayne Shorter Levora Dredge, MD [id number], [name], NP  Brief Summary: Patient is a 56 y.o.  female with history of bipolar disorder-who was hospitalized at [facility] received a Ativan challenge for possible catatonia-with minimal improvement-due to persistent altered mental status/tachycardia-patient was admitted to [facility] service for further evaluation and treatment.  No significant finding regarding her tachycardia work-up, 2D echo within normal limit, no significant electrolyte abnormalities.  Evaluated by psychiatry-and unfortunately-patient did not meet criteria to go back to [facility].  While awaiting appropriate disposition-small course complicated by acute COVID-19 infection.  Significant events: 9/4>> transferred to [facility]-from [facility]-Altered-no improvement after Ativan challenge. 9/22>> febrile 9/23>> COVID-19 positive  Significant studies: 9/04>> vitamin B12: 290 9/04>> CXR: No obvious pneumonia 9/04>> MRI brain: No acute intracranial abnormality.  Advanced cerebral/cerebellar atrophy 9/04-9/5>> LTM EEG: No seizures. 9/05>> TSH: 1.3 9/5-9/6>> LTM EEG: Negative. 9/06>> CTA chest: No PE-no PNA. 9/10>> bilateral lower extremity Doppler: No DVT 9/19>> Echo: EF 60-65%. 9/23>> CXR: No PNA 9/25>> CT head: No acute intracranial process  Significant microbiology data: 9/23>> COVID PCR positive 9/23>> blood culture: Negative  Procedures: None   Consults: Neurology, psychiatry  Subjective:   Lying comfortably in bed-awake-unchanged for the past several days.  Awaiting safe disposition.  Objective: Vitals: Blood pressure 109/79, pulse 91, temperature 97.8 F (36.6 C), temperature source Oral, resp. rate 16, SpO2 97 %.   Exam: Gen Exam:not in any distress HEENT:atraumatic, normocephalic Chest: B/L clear to  auscultation anteriorly CVS:S1S2 regular Abdomen:soft non tender, non distended Extremities:no edema Neurology: Non focal Skin: no rash    Assessment/Plan: Acute encephalopathy Dementia Thought to have undiagnosed dementia with significant cerebral/cerebellar atrophy on imaging studies.  No evidence of seizures-no further recommendations from neurology/psychiatry.  Elevated D-dimer  CTA chest/lower extremity Dopplers negative for VTE.  COVID-19 infection:  Afebrile overnight-no clinical signs of pneumonia.  On room air.  Continue to monitor off treatment.  Dementia:  Significant atrophy on neuroimaging-chronic finding-neurology suspects patient has progressive dementia.  TSH/RPR stable-B12 being supplemented.  Await SNF bed , [name] cleared by psych for placement.  Vitamin B12 deficiency:  Continue supplementation.  Bipolar disorder:  Appreciate psych input-continue Haldol .  Per psych requires SNF/memory care.  Sinus tachycardia  Resolved-unclear etiology but thought to be due to dehydration.    Tobacco abuse: Continue transdermal nicotine  BMI: Estimated body mass index is 25.62 kg/m as calculated from the following:   Height as of an earlier encounter on 12/24/21: 5\' 6"  (1.676 m).   Weight as of an earlier encounter on 12/24/21: 72 kg.   Code status:   Code Status: Prior   DVT Prophylaxis: enoxaparin (LOVENOX) injection 40 mg Start: 12/26/21 1200    Family Communication: None at bedside   Disposition Plan: Status is: Inpt Planned Discharge Destination: Memory care/SNF-awaiting bed.  Remains medically stable for transfer.  Diet: Diet Order             Diet regular Room service appropriate? No; Fluid consistency: Thin  Diet effective now                    MEDICATIONS: Scheduled Meds:  atropine  1 drop Sublingual TID   vitamin B-12  1,000  mcg Oral Daily   docusate sodium  200 mg Oral BID   enoxaparin (LOVENOX) injection  40 mg Subcutaneous Q24H    feeding supplement (KATE FARMS STANDARD 1.4)  325 mL Oral BID BM   haloperidol  1 mg Oral Daily   And   haloperidol  2 mg Oral QHS   hydrocerin   Topical BID   metoprolol tartrate  25 mg Oral BID   nicotine  14 mg Transdermal Daily   polyethylene glycol  17 g Oral BID   scopolamine  1 patch Transdermal Q72H   Continuous Infusions: PRN Meds:.acetaminophen, alum & mag hydroxide-simeth, fluticasone, hydrOXYzine, LORazepam, magnesium hydroxide, metoprolol tartrate, OLANZapine zydis **AND** ziprasidone, ondansetron, mouth rinse   I have personally reviewed following labs and imaging studies  LABORATORY DATA:  Recent Labs  Lab 01/20/22 1040  WBC 4.8  HGB 13.3  HCT 38.6  PLT 381  MCV 87.5  MCH 30.2  MCHC 34.5  RDW 12.8     Recent Labs  Lab 01/14/22 1328 01/20/22 1040  NA 140 139  K 4.5 4.4  CL 107 105  CO2 25 23  GLUCOSE 93 121*  BUN 14 11  CREATININE 1.14* 1.19*  CALCIUM 9.1 9.5  AST 23  --   ALT 18  --   ALKPHOS 47  --   BILITOT 0.6  --   ALBUMIN 3.0*  --   LATICACIDVEN 0.9  --   AMMONIA 34  --     Lab Results  Component Value Date   TSH 1.468 01/09/2022    RADIOLOGY STUDIES/RESULTS: No results found.   LOS: 26 days   Signature  Oren Binet M.D on 01/21/2022 at 10:54 AM   -  To page go to [email or web address]

## 2022-01-22 NOTE — Progress Notes (Signed)
PT Cancellation Note  Patient Details Name: Natalie Brown MRN: [id number] DOB: [birth year]   Cancelled Treatment:    Reason Eval/Treat Not Completed: PT screened, no needs identified, will sign off - pt mobilizing with RN staff and mobility specialists at this time, no skills PT needs presently. PT to sign off, please reconsult if needed.  Stacie Glaze, PT DPT Acute Rehabilitation Services Pager [phone]  Office [phone]    [name] 01/22/2022, 1:34 PM

## 2022-01-22 NOTE — Progress Notes (Signed)
Mobility Specialist Progress Note:   01/22/22 0930  Mobility  Activity Ambulated with assistance to bathroom  Activity Response Tolerated well  Distance Ambulated (ft) 30 ft  $Mobility charge 1 Mobility  Level of Assistance Contact guard assist, steadying assist  Assistive Device Other (Comment) (HHA)  Mobility Referral Yes   Pt eager for mobility, asking to go to BR upon arrival. Required HHA for steadying throughout. Pt back in bed with all needs met, RN in room.  Nelta Numbers Acute Rehab Secure Chat or Office Phone: [phone]

## 2022-01-22 NOTE — Progress Notes (Signed)
PROGRESS NOTE        PATIENT DETAILS Name: Natalie Brown Age: 56 y.o. Sex: female Date of Birth: Jan 23, 1966 Admit Date: 12/24/2021 Admitting Physician Evalee Mutton Kristeen Mans, MD [id number], [name], NP  Brief Summary: Patient is a 56 y.o.  female with history of bipolar disorder-who was hospitalized at [facility] received a Ativan challenge for possible catatonia-with minimal improvement-due to persistent altered mental status/tachycardia-patient was admitted to [facility] service for further evaluation and treatment.  No significant finding regarding her tachycardia work-up, 2D echo within normal limit, no significant electrolyte abnormalities.  Evaluated by psychiatry-and unfortunately-patient did not meet criteria to go back to [facility]  While awaiting appropriate disposition-small course complicated by acute [id number] infection.  Significant events: 9/4>> transferred to [facility]-from [facility]-Altered-no improvement after Ativan challenge. 9/22>> febrile 9/23>> COVID-19 positive  Significant studies: 9/04>> vitamin B12: 290 9/04>> CXR: No obvious pneumonia 9/04>> MRI brain: No acute intracranial abnormality.  Advanced cerebral/cerebellar atrophy 9/04-9/5>> LTM EEG: No seizures. 9/05>> TSH: 1.3 9/5-9/6>> LTM EEG: Negative. 9/06>> CTA chest: No PE-no PNA. 9/10>> bilateral lower extremity Doppler: No DVT 9/19>> Echo: EF 60-65%. 9/23>> CXR: No PNA 9/25>> CT head: No acute intracranial process  Significant microbiology data: 9/23>> COVID PCR positive 9/23>> blood culture: Negative  Procedures: None   Consults: Neurology, psychiatry  Subjective:   No major issues overnight.  Awaiting SNF  Objective: Vitals: Blood pressure 101/70, pulse 60, temperature 98 F (36.7 C), temperature source Temporal, resp. rate 18, SpO2 100 %.   Exam: Gen Exam:not in any distress HEENT:atraumatic, normocephalic Chest: B/L clear to auscultation anteriorly CVS:S1S2 regular Abdomen:soft non  tender, non distended Extremities:no edema Neurology: Non focal Skin: no rash   Assessment/Plan: Acute encephalopathy Dementia Thought to have undiagnosed dementia with significant cerebral/cerebellar atrophy on imaging studies.  No evidence of seizures-no further recommendations from neurology/psychiatry.  Elevated D-dimer  CTA chest/lower extremity Dopplers negative for VTE.  COVID-19 infection:  Afebrile overnight-no clinical signs of pneumonia.  On room air.  Continue to monitor off treatment.  Dementia:  Significant atrophy on neuroimaging-chronic finding-neurology suspects patient has progressive dementia.  TSH/RPR stable-B12 being supplemented.  Await SNF bed , [name] cleared by psych for placement.  Vitamin B12 deficiency:  Continue supplementation.  Bipolar disorder:  Appreciate psych input-continue Haldol .  Per psych requires SNF/memory care.  Sinus tachycardia  Resolved-unclear etiology but thought to be due to dehydration.    Tobacco abuse: Continue transdermal nicotine  BMI: Estimated body mass index is 25.62 kg/m as calculated from the following:   Height as of an earlier encounter on 12/24/21: 5\' 6"  (1.676 m).   Weight as of an earlier encounter on 12/24/21: 72 kg.   Code status:   Code Status: Prior   DVT Prophylaxis: enoxaparin (LOVENOX) injection 40 mg Start: 12/26/21 1200    Family Communication: [phone]-updated over the phone 10/3   Disposition Plan: Status is: Inpt Planned Discharge Destination: Memory care/SNF-awaiting bed.  Remains medically stable for transfer.  Diet: Diet Order             Diet regular Room service appropriate? No; Fluid consistency: Thin  Diet effective now                    MEDICATIONS: Scheduled Meds:  atropine  1 drop Sublingual TID   vitamin B-12  1,000 mcg Oral Daily  docusate sodium  200 mg Oral BID   enoxaparin (LOVENOX) injection  40 mg Subcutaneous Q24H   feeding supplement (KATE FARMS  STANDARD 1.4)  325 mL Oral BID BM   haloperidol  1 mg Oral Daily   And   haloperidol  2 mg Oral QHS   hydrocerin   Topical BID   metoprolol tartrate  25 mg Oral BID   nicotine  14 mg Transdermal Daily   polyethylene glycol  17 g Oral BID   scopolamine  1 patch Transdermal Q72H   Continuous Infusions: PRN Meds:.acetaminophen, alum & mag hydroxide-simeth, fluticasone, hydrOXYzine, LORazepam, magnesium hydroxide, metoprolol tartrate, OLANZapine zydis **AND** ziprasidone, ondansetron, mouth rinse   I have personally reviewed following labs and imaging studies  LABORATORY DATA:  Recent Labs  Lab 01/20/22 1040  WBC 4.8  HGB 13.3  HCT 38.6  PLT 381  MCV 87.5  MCH 30.2  MCHC 34.5  RDW 12.8     Recent Labs  Lab 01/20/22 1040  NA 139  K 4.4  CL 105  CO2 23  GLUCOSE 121*  BUN 11  CREATININE 1.19*  CALCIUM 9.5    Lab Results  Component Value Date   TSH 1.468 01/09/2022    RADIOLOGY STUDIES/RESULTS: No results found.   LOS: 27 days   Signature  Jeoffrey Massed M.D on 01/22/2022 at 2:41 PM   -  To page go to [email or web address]

## 2022-01-22 NOTE — Progress Notes (Signed)
PROGRESS NOTE        PATIENT DETAILS Name: Natalie Brown Age: 56 y.o. Sex: female Date of Birth: [birth year] Admit Date: 12/24/2021 Admitting Physician Evalee Mutton Kristeen Mans, MD [id number], [name], NP  Brief Summary: Patient is a 56 y.o.  female with history of bipolar disorder-who was hospitalized at [facility] received a Ativan challenge for possible catatonia-with minimal improvement-due to persistent altered mental status/tachycardia-patient was admitted to [facility] service for further evaluation and treatment.  No significant finding regarding her tachycardia work-up, 2D echo within normal limit, no significant electrolyte abnormalities.  Evaluated by psychiatry-and unfortunately-patient did not meet criteria to go back to [name].  While awaiting appropriate disposition-small course complicated by acute [id number] infection.  Significant events: 9/4>> transferred to [facility]from [facility]-Altered-no improvement after Ativan challenge. 9/22>> febrile 9/23>> COVID-19 positive  Significant studies: 9/04>> vitamin B12: 290 9/04>> CXR: No obvious pneumonia 9/04>> MRI brain: No acute intracranial abnormality.  Advanced cerebral/cerebellar atrophy 9/04-9/5>> LTM EEG: No seizures. 9/05>> TSH: 1.3 9/5-9/6>> LTM EEG: Negative. 9/06>> CTA chest: No PE-no PNA. 9/10>> bilateral lower extremity Doppler: No DVT 9/19>> Echo: EF 60-65%. 9/23>> CXR: No PNA 9/25>> CT head: No acute intracranial process  Significant microbiology data: 9/23>> COVID PCR positive 9/23>> blood culture: Negative  Procedures: None   Consults: Neurology, psychiatry  Subjective:   No major issues overnight.  Awaiting SNF  Objective: Vitals: Blood pressure 101/70, pulse 60, temperature 98 F (36.7 C), temperature source Temporal, resp. rate 18, SpO2 100 %.   Exam: Gen Exam:not in any distress HEENT:atraumatic, normocephalic Chest: B/L clear to auscultation anteriorly CVS:S1S2 regular Abdomen:soft non  tender, non distended Extremities:no edema Neurology: Non focal Skin: no rash   Assessment/Plan: Acute encephalopathy Dementia Thought to have undiagnosed dementia with significant cerebral/cerebellar atrophy on imaging studies.  No evidence of seizures-no further recommendations from neurology/psychiatry.  Elevated D-dimer  CTA chest/lower extremity Dopplers negative for VTE.  COVID-19 infection:  Afebrile overnight-no clinical signs of pneumonia.  On room air.  Continue to monitor off treatment.  Dementia:  Significant atrophy on neuroimaging-chronic finding-neurology suspects patient has progressive dementia.  TSH/RPR stable-B12 being supplemented.  Await SNF bed , [name] cleared by psych for placement.  Vitamin B12 deficiency:  Continue supplementation.  Bipolar disorder:  Appreciate psych input-continue Haldol .  Per psych requires SNF/memory care.  Sinus tachycardia  Resolved-unclear etiology but thought to be due to dehydration.    Tobacco abuse: Continue transdermal nicotine  BMI: Estimated body mass index is 25.62 kg/m as calculated from the following:   Height as of an earlier encounter on 12/24/21: 5\' 6"  (1.676 m).   Weight as of an earlier encounter on 12/24/21: 72 kg.   Code status:   Code Status: Prior   DVT Prophylaxis: enoxaparin (LOVENOX) injection 40 mg Start: 12/26/21 1200    Family Communication: None at bedside   Disposition Plan: Status is: Inpt Planned Discharge Destination: Memory care/SNF-awaiting bed.  Remains medically stable for transfer.  Diet: Diet Order             Diet regular Room service appropriate? No; Fluid consistency: Thin  Diet effective now                    MEDICATIONS: Scheduled Meds:  atropine  1 drop Sublingual TID   vitamin B-12  1,000 mcg Oral Daily   docusate sodium  200 mg Oral BID   enoxaparin (LOVENOX) injection  40 mg Subcutaneous Q24H   feeding supplement (KATE FARMS STANDARD 1.4)  325 mL Oral BID  BM   haloperidol  1 mg Oral Daily   And   haloperidol  2 mg Oral QHS   hydrocerin   Topical BID   metoprolol tartrate  25 mg Oral BID   nicotine  14 mg Transdermal Daily   polyethylene glycol  17 g Oral BID   scopolamine  1 patch Transdermal Q72H   Continuous Infusions: PRN Meds:.acetaminophen, alum & mag hydroxide-simeth, fluticasone, hydrOXYzine, LORazepam, magnesium hydroxide, metoprolol tartrate, OLANZapine zydis **AND** ziprasidone, ondansetron, mouth rinse   I have personally reviewed following labs and imaging studies  LABORATORY DATA:  Recent Labs  Lab 01/20/22 1040  WBC 4.8  HGB 13.3  HCT 38.6  PLT 381  MCV 87.5  MCH 30.2  MCHC 34.5  RDW 12.8     Recent Labs  Lab 01/20/22 1040  NA 139  K 4.4  CL 105  CO2 23  GLUCOSE 121*  BUN 11  CREATININE 1.19*  CALCIUM 9.5    Lab Results  Component Value Date   TSH 1.468 01/09/2022    RADIOLOGY STUDIES/RESULTS: No results found.   LOS: 27 days   Signature  Oren Binet M.D on 01/22/2022 at 11:48 AM   -  To page go to [email or web address]

## 2022-01-23 NOTE — TOC Progression Note (Signed)
Transition of Care [facility]) - Progression Note    Patient Details  Name: AIMIE WAGMAN MRN: [id number] Date of Birth: 10/12/1965  Transition of Care [facility]) CM/SW [place], LCSW Phone Number: 01/23/2022, 3:34 PM  Clinical Narrative:    Still no placement options. Case discussed with [facility] leadership.     Barriers to Discharge: Financial Resources, Inadequate or no insurance, Homeless with medical needs  Expected Discharge Plan and Services   In-house Referral: Clinical Social Work   Post Acute Care Choice: Nursing Home Living arrangements for the past 2 months: Homeless [facility]) Expected Discharge Date: 12/24/21                                     Social Determinants of Health (SDOH) Interventions    Readmission Risk Interventions     No data to display

## 2022-01-23 NOTE — Progress Notes (Signed)
This chaplain is on the unit and met the Pt. significant other-Jeff in the hallway. Merry Proud is tearful and asking for ways to support the Pt.   The chaplain offered a listening presence as Merry Proud talked about his relationship with the Pt. and his desire to stay strong at the Pt. bedside. The chaplain understands Merry Proud shares the relationship of caregiver with the Pt. mother and sister. The chaplain offers the space for Merry Proud to inform the medical team of the Pt. personality unique only to him. Merry Proud agreed to respectfully use the RN for his communication with the medical team.  The chaplain observed Merry Proud heading to the Pt. room with what he desired, composure and compassion.   Chaplain Sallyanne Kuster [phone]

## 2022-01-23 NOTE — Progress Notes (Signed)
Occupational Therapy Treatment Patient Details Name: Natalie Brown MRN: [id number] DOB: 01-05-1966 Today's Date: 01/23/2022   History of present illness Patient is a 56 y.o.  female with history of bipolar disorder-who was hospitalized at [facility] received a Ativan challenge for possible catatonia-with minimal improvement-due to persistent altered mental status/tachycardia-patient was admitted to [facility] service for further evaluation and treatment.   OT comments  Patient seen this date and discharged off OT.  Patient has not been able to clear cognitively, and thus has not progressed to independence with all goals areas.  Currently she is needing generalized supervision, verbal and tactile cues for initiation of a task, sequencing through that task and task completion, and safety cues.  Patient has been at this level for a few weeks, and unless she clears cognitively, she will be unable to advance beyond this level.  [name] slow to respond, and is very deliberate in her actions.  Patient appears to need 24 hour support for safety, medications and community support.  No further OT needs are identified in the acute setting.  Continued counseling and medication management are recommended.     Recommendations for follow up therapy are one component of a multi-disciplinary discharge planning process, led by the attending physician.  Recommendations may be updated based on patient status, additional functional criteria and insurance authorization.    Follow Up Recommendations  Other (comment) (community support and counseling services)    Assistance Recommended at Discharge Frequent or constant Supervision/Assistance  Patient can return home with the following  Assist for transportation;Direct supervision/assist for medications management;Direct supervision/assist for financial management   Equipment Recommendations  None recommended by OT    Recommendations for Other Services      Precautions /  Restrictions Precautions Precautions: Fall Restrictions Weight Bearing Restrictions: No       Mobility Bed Mobility Overal bed mobility: Modified Independent                  Transfers Overall transfer level: Modified independent                       Balance Overall balance assessment: Mild deficits observed, not formally tested                                         ADL either performed or assessed with clinical judgement   ADL                                         General ADL Comments: generalized supervision with verbal cues as needed for safety and processing    Extremity/Trunk Assessment Upper Extremity Assessment Upper Extremity Assessment: Overall WFL for tasks assessed   Lower Extremity Assessment Lower Extremity Assessment: Overall WFL for tasks assessed   Cervical / Trunk Assessment Cervical / Trunk Assessment: Normal    Vision Baseline Vision/History: 0 No visual deficits     Perception Perception Perception: Not tested   Praxis Praxis Praxis: Not tested    Cognition Arousal/Alertness: Awake/alert Behavior During Therapy: Flat affect Overall Cognitive Status: History of cognitive impairments - at baseline                   Orientation Level: Person Current Attention Level: Focused     Safety/Judgement: Decreased  awareness of deficits Awareness: Intellectual Problem Solving: Slow processing, Requires verbal cues, Requires tactile cues                             Pertinent Vitals/ Pain       Pain Assessment Pain Assessment: Faces Faces Pain Scale: No hurt Pain Intervention(s): Monitored during session                                                          Frequency           Progress Toward Goals  OT Goals(current goals can now be found in the care plan section)  Progress towards OT goals: Not progressing toward goals -  comment  Acute Rehab OT Goals OT Goal Formulation: Patient unable to participate in goal setting Time For Goal Achievement: 01/28/22 Potential to Achieve Goals: [place] Discharge plan needs to be updated    Co-evaluation                 AM-PAC OT "6 Clicks" Daily Activity     Outcome Measure   Help from another person eating meals?: None Help from another person taking care of personal grooming?: None Help from another person toileting, which includes using toliet, bedpan, or urinal?: A Little Help from another person bathing (including washing, rinsing, drying)?: A Little Help from another person to put on and taking off regular upper body clothing?: None Help from another person to put on and taking off regular lower body clothing?: A Little 6 Click Score: 21    End of Session    OT Visit Diagnosis: Unsteadiness on feet (R26.81)   Activity Tolerance Patient tolerated treatment well   Patient Left in bed;with call bell/phone within reach;with bed alarm set   Nurse Communication Mobility status        Time: 4742-5956 OT Time Calculation (min): 18 min  Charges: OT General Charges $OT Visit: 1 Visit OT Treatments $Self Care/Home Management : 8-22 mins  01/23/2022  RP, OTR/L  Acute Rehabilitation Services  Office:  [phone]   Metta Clines 01/23/2022, 3:03 PM

## 2022-01-23 NOTE — Progress Notes (Signed)
PROGRESS NOTE        PATIENT DETAILS Name: Natalie Brown Age: 56 y.o. Sex: female Date of Birth: [birth year] Admit Date: 12/24/2021 Admitting Physician Evalee Mutton Kristeen Mans, MD [id number], [name], NP  Brief Summary: Patient is a 56 y.o.  female with history of bipolar disorder-who was hospitalized at [facility] received a Ativan challenge for possible catatonia-with minimal improvement-due to persistent altered mental status/tachycardia-patient was admitted to [facility] service for further evaluation and treatment.  No significant finding regarding her tachycardia work-up, 2D echo within normal limit, no significant electrolyte abnormalities.  Evaluated by psychiatry-and unfortunately-patient did not meet criteria to go back to [facility].  While awaiting appropriate disposition-small course complicated by acute QIHKV-42 infection.  Significant events: 9/4>> transferred to [facility]-from [facility]-Altered-no improvement after Ativan challenge. 9/22>> febrile 9/23>> COVID-19 positive  Significant studies: 9/04>> vitamin B12: 290 9/04>> CXR: No obvious pneumonia 9/04>> MRI brain: No acute intracranial abnormality.  Advanced cerebral/cerebellar atrophy 9/04-9/5>> LTM EEG: No seizures. 9/05>> TSH: 1.3 9/5-9/6>> LTM EEG: Negative. 9/06>> CTA chest: No PE-no PNA. 9/07>> RPR: Nonreactive 9/10>> bilateral lower extremity Doppler: No DVT 9/19>> Echo: EF 60-65%. 9/23>> CXR: No PNA 9/25>> CT head: No acute intracranial process  Significant microbiology data: 9/23>> COVID PCR positive 9/23>> blood culture: Negative  Procedures: None   Consults: Neurology, psychiatry  Subjective:   Brushing her teeth when I saw her earlier this morning-mother at bedside.  Objective: Vitals: Blood pressure 116/72, pulse 78, temperature 97.8 F (36.6 C), temperature source Axillary, resp. rate 16, SpO2 98 %.   Exam: Gen Exam:not in any distress HEENT:atraumatic, normocephalic Chest: B/L clear to  auscultation anteriorly CVS:S1S2 regular Abdomen:soft non tender, non distended Extremities:no edema Neurology: Non focal Skin: no rash   Assessment/Plan: Acute encephalopathy Dementia Remains Pleasantly confused-on occasion follow simple commands. Thought to have undiagnosed dementia with significant cerebral/cerebellar atrophy on imaging studies.  No evidence of seizures-no further recommendations from neurology/psychiatry.  Elevated D-dimer  CTA chest/lower extremity Dopplers negative for VTE.  COVID-19 infection:  Afebrile overnight-no clinical signs of pneumonia.  On room air.  Continue to monitor off treatment.  Dementia:  Significant atrophy on neuroimaging-chronic finding-neurology suspects patient has progressive dementia.  TSH/RPR stable-B12 being supplemented.  Await SNF bed , [name] cleared by psych for placement.  Vitamin B12 deficiency:  Continue supplementation.  Bipolar disorder:  Appreciate psych input-continue Haldol .  Per psych requires SNF/memory care.  Sinus tachycardia  Resolved-unclear etiology but thought to be due to dehydration.    Tobacco abuse: Continue transdermal nicotine  BMI: Estimated body mass index is 25.62 kg/m as calculated from the following:   Height as of an earlier encounter on 12/24/21: 5\' 6"  (1.676 m).   Weight as of an earlier encounter on 12/24/21: 72 kg.   Code status:   Code Status: Prior   DVT Prophylaxis: enoxaparin (LOVENOX) injection 40 mg Start: 12/26/21 1200    Family Communication: [phone]-updated at bedside on 10/4   Disposition Plan: Status is: Inpt Planned Discharge Destination: Memory care/SNF-awaiting bed.  Remains medically stable for transfer.  Diet: Diet Order             Diet regular Room service appropriate? No; Fluid consistency: Thin  Diet effective now                    MEDICATIONS: Scheduled Meds:  atropine  1 drop Sublingual TID   vitamin B-12  1,000 mcg Oral Daily    docusate sodium  200 mg Oral BID   enoxaparin (LOVENOX) injection  40 mg Subcutaneous Q24H   feeding supplement (KATE FARMS STANDARD 1.4)  325 mL Oral BID BM   haloperidol  1 mg Oral Daily   And   haloperidol  2 mg Oral QHS   hydrocerin   Topical BID   metoprolol tartrate  25 mg Oral BID   nicotine  14 mg Transdermal Daily   polyethylene glycol  17 g Oral BID   scopolamine  1 patch Transdermal Q72H   Continuous Infusions: PRN Meds:.acetaminophen, alum & mag hydroxide-simeth, fluticasone, hydrOXYzine, LORazepam, magnesium hydroxide, metoprolol tartrate, OLANZapine zydis **AND** ziprasidone, ondansetron, mouth rinse   I have personally reviewed following labs and imaging studies  LABORATORY DATA:  Recent Labs  Lab 01/20/22 1040  WBC 4.8  HGB 13.3  HCT 38.6  PLT 381  MCV 87.5  MCH 30.2  MCHC 34.5  RDW 12.8     Recent Labs  Lab 01/20/22 1040  NA 139  K 4.4  CL 105  CO2 23  GLUCOSE 121*  BUN 11  CREATININE 1.19*  CALCIUM 9.5    Lab Results  Component Value Date   TSH 1.468 01/09/2022    RADIOLOGY STUDIES/RESULTS: No results found.   LOS: 28 days   Signature  Jeoffrey Massed M.D on 01/23/2022 at 11:47 AM   -  To page go to [email or web address]

## 2022-01-23 NOTE — TOC Progression Note (Addendum)
Transition of Care [facility]) - Progression Note    Patient Details  Name: Natalie Brown MRN: [id number] Date of Birth: 01/22/1966  Transition of Care [facility]) CM/SW [place], LCSW Phone Number: 01/23/2022, 1:36 PM  Clinical Narrative:    CSW contacted [facility] to determine if patient has a care coordinator assigned for possible assistance with placement. [facility] (Call Ref# [phone]) referred CSW to [facility] as patient's assigned PCM. CSW spoke with them [phone] was told they only do outpatient treatment and no case management. CSW contacted [facility] again and they reassured CSW that was the correct number. CSW contacted them again and she stated they did not have patient in their system. CSW asked to speak with someone who might know about their care management program and she sent CSW to [phone]; CSW left a voicemail.  CSW contacted [facility] at a different number and was provided with the following number for [facility]: [phone]. CSW spoke with their Supervisor, Butch Penny, who directed CSW to [place], Transport planner [phone]). Roselyn Reef contacted her supervisor and called CSW back. She stated she will complete a Zoom assessment with patient tomorrow at 2pm to see if they can provide any assistance.   CSW updated patient's mother who reported being upset at patient's current condition (not talking). She stated that prior to patient's arrival at [facility] she was talking and now she is no longer is so she is consulting a Chief Executive Officer. [name] still working with [place] to get patient approved.       Barriers to Discharge: Financial Resources, Inadequate or no insurance, Homeless with medical needs  Expected Discharge Plan and Services   In-house Referral: Clinical Social Work   Post Acute Care Choice: Nursing Home Living arrangements for the past 2 months: Homeless [facility]) Expected Discharge Date: 12/24/21                                      Social Determinants of Health (SDOH) Interventions    Readmission Risk Interventions     No data to display

## 2022-01-24 NOTE — Progress Notes (Signed)
Mobility Specialist Progress Note:   01/24/22 0943  Mobility  Activity Ambulated with assistance in hallway  Activity Response Tolerated well  Distance Ambulated (ft) 350 ft  $Mobility charge 1 Mobility  Level of Assistance Contact guard assist, steadying assist  Assistive Device Other (Comment) (HHA)  Mobility Referral Yes   Pt agreeable to mobility session. Required HHA for steadying. Back in bed with all needs met.   Nelta Numbers Acute Rehab Secure Chat or Office Phone: [phone]

## 2022-01-24 NOTE — Progress Notes (Signed)
Mobility Specialist Progress Note:   01/24/22 1110  Mobility  Activity Ambulated independently in hallway;Ambulated independently in room  Activity Response Tolerated well  Distance Ambulated (ft) 100 ft  $Mobility charge 1 Mobility  Level of Assistance Standby assist, set-up cues, supervision of patient - no hands on  Assistive Device None  Mobility Referral Yes   Pt received ambulating out in hallway independently requesting "help". Pt ambulated short distance in hallway per request, then reoriented into room. Pt back in bed with all needs met.   Nelta Numbers Acute Rehab Secure Chat or Office Phone: [phone]

## 2022-01-24 NOTE — Progress Notes (Signed)
PROGRESS NOTE        PATIENT DETAILS Name: Natalie Brown Age: 56 y.o. Sex: female Date of Birth: [birth year] Admit Date: 12/24/2021 Admitting Physician Natalie Shorter Levora Dredge, MD Natalie Brown, Natalie Abrahams, NP  Brief Summary: Patient is a 56 y.o.  female with history of bipolar disorder-who was hospitalized at [facility] received a Ativan challenge for possible catatonia-with minimal improvement-due to persistent altered mental status/tachycardia-patient was admitted to [facility] service for further evaluation and treatment.  No significant finding regarding her tachycardia work-up, 2D echo within normal limit, no significant electrolyte abnormalities.  Evaluated by psychiatry-and unfortunately-patient did not meet criteria to go back to [facility].  While awaiting appropriate disposition-small course complicated by acute COVID-19 infection.  Significant events: 9/4>> transferred to [facility]-from [facility]-Altered-no improvement after Ativan challenge. 9/22>> febrile 9/23>> COVID-19 positive  Significant studies: 9/04>> vitamin B12: 290 9/04>> CXR: No obvious pneumonia 9/04>> MRI brain: No acute intracranial abnormality.  Advanced cerebral/cerebellar atrophy 9/04-9/5>> LTM EEG: No seizures. 9/05>> TSH: 1.3 9/5-9/6>> LTM EEG: Negative. 9/06>> CTA chest: No PE-no PNA. 9/07>> RPR: Nonreactive 9/10>> bilateral lower extremity Doppler: No DVT 9/19>> Echo: EF 60-65%. 9/23>> CXR: No PNA 9/25>> CT head: No acute intracranial process  Significant microbiology data: 9/23>> COVID PCR positive 9/23>> blood culture: Negative  Procedures: None   Consults: Neurology, psychiatry  Subjective:   Sitting up in bed-when I saw her earlier this morning.  No major issues overnight.  She appears unchanged.  We will follow some commands-but slow at times.  Objective: Vitals: Blood pressure (!) 87/53, pulse (!) 53, temperature 98.9 F (37.2 C), temperature source Oral, resp. rate 18, SpO2 97 %.    Exam: Gen Exam:not in any distress HEENT:atraumatic, normocephalic Chest: B/L clear to auscultation anteriorly CVS:S1S2 regular Abdomen:soft non tender, non distended Extremities:no edema Neurology: Non focal Skin: no rash   Assessment/Plan: Acute encephalopathy Dementia Remains Pleasantly confused-on occasion follow simple commands. Thought to have undiagnosed dementia with significant cerebral/cerebellar atrophy on imaging studies.  No evidence of seizures-no further recommendations from neurology/psychiatry.  Elevated D-dimer  CTA chest/lower extremity Dopplers negative for VTE.  COVID-19 infection:  Afebrile overnight-no clinical signs of pneumonia.  On room air.  Continue to monitor off treatment.  Dementia:  Significant atrophy on neuroimaging-chronic finding-neurology suspects patient has progressive dementia.  TSH/RPR stable-B12 being supplemented.  Await SNF bed , [name] cleared by psych for placement.  Vitamin B12 deficiency:  Continue supplementation.  Bipolar disorder:  Appreciate psych input-continue Haldol .  Per psych requires SNF/memory care.  Sinus tachycardia  Resolved-unclear etiology but thought to be due to dehydration.    Tobacco abuse: Continue transdermal nicotine  BMI: Estimated body mass index is 25.62 kg/m as calculated from the following:   Height as of an earlier encounter on 12/24/21: 5\' 6"  (1.676 m).   Weight as of an earlier encounter on 12/24/21: 72 kg.   Code status:   Code Status: Prior   DVT Prophylaxis: enoxaparin (LOVENOX) injection 40 mg Start: 12/26/21 1200    Family Communication: Mother-[phone]-updated at bedside on 10/4   Disposition Plan: Status is: Inpt Planned Discharge Destination: Memory care/SNF-awaiting bed.  Remains medically stable for transfer.  Diet: Diet Order             Diet regular Room service appropriate? No; Fluid consistency: Thin  Diet effective now  MEDICATIONS: Scheduled Meds:  atropine  1 drop Sublingual TID   vitamin B-12  1,000 mcg Oral Daily   docusate sodium  200 mg Oral BID   enoxaparin (LOVENOX) injection  40 mg Subcutaneous Q24H   feeding supplement (KATE FARMS STANDARD 1.4)  325 mL Oral BID BM   haloperidol  1 mg Oral Daily   And   haloperidol  2 mg Oral QHS   hydrocerin   Topical BID   metoprolol tartrate  25 mg Oral BID   nicotine  14 mg Transdermal Daily   polyethylene glycol  17 g Oral BID   scopolamine  1 patch Transdermal Q72H   Continuous Infusions: PRN Meds:.acetaminophen, alum & mag hydroxide-simeth, fluticasone, hydrOXYzine, LORazepam, magnesium hydroxide, metoprolol tartrate, OLANZapine zydis **AND** ziprasidone, ondansetron, mouth rinse   I have personally reviewed following labs and imaging studies  LABORATORY DATA:  Recent Labs  Lab 01/20/22 1040  WBC 4.8  HGB 13.3  HCT 38.6  PLT 381  MCV 87.5  MCH 30.2  MCHC 34.5  RDW 12.8     Recent Labs  Lab 01/20/22 1040  NA 139  K 4.4  CL 105  CO2 23  GLUCOSE 121*  BUN 11  CREATININE 1.19*  CALCIUM 9.5    Lab Results  Component Value Date   TSH 1.468 01/09/2022    RADIOLOGY STUDIES/RESULTS: No results found.   LOS: 29 days   Signature  Oren Binet M.D on 01/24/2022 at 12:10 PM   -  To page go to [email or web address]

## 2022-01-24 NOTE — Plan of Care (Signed)

## 2022-01-24 NOTE — TOC Progression Note (Addendum)
Transition of Care [facility]) - Progression Note    Patient Details  Name: Natalie Brown MRN: [id number] Date of Birth: May 20, 1965  Transition of Care [facility]) CM/SW [place], LCSW Phone Number: 01/24/2022, 2:33 PM  Clinical Narrative:    CSW completed Zoom meeting with Ella Bodo at [facility] [phone]); mother and boyfriend present. Mother reports verbal permission for CSW to provide [place] with any info and records.   Ozella Almond requested CSW fax Guardianship paper (mother is Guardian of person) and hospital records to f. [phone].    Barriers to Discharge: Financial Resources, Inadequate or no insurance, Homeless with medical needs  Expected Discharge Plan and Services   In-house Referral: Clinical Social Work   Post Acute Care Choice: Nursing Home Living arrangements for the past 2 months: Homeless [facility]) Expected Discharge Date: 12/24/21                                     Social Determinants of Health (SDOH) Interventions    Readmission Risk Interventions     No data to display

## 2022-01-25 NOTE — Progress Notes (Signed)
PROGRESS NOTE        PATIENT DETAILS Name: Natalie Brown Age: 56 y.o. Sex: female Date of Birth: 14-Dec-1965 Admit Date: 12/24/2021 Admitting Physician Dewayne Shorter Levora Dredge, MD OMV:[id number], [name], NP  Brief Summary: Patient is a 56 y.o.  female with history of bipolar disorder-who was hospitalized at [facility] received a Ativan challenge for possible catatonia-with minimal improvement-due to persistent altered mental status/tachycardia-patient was admitted to [facility] service for further evaluation and treatment.  No significant finding regarding her tachycardia work-up, 2D echo within normal limit, no significant electrolyte abnormalities.  Evaluated by psychiatry-and unfortunately-patient did not meet criteria to go back to [facility].  While awaiting appropriate disposition-small course complicated by acute COVID-19 infection.  Significant events: 9/4>> transferred to [facility]from [facility]-Altered-no improvement after Ativan challenge. 9/22>> febrile 9/23>> COVID-19 positive  Significant studies: 9/04>> vitamin B12: 290 9/04>> CXR: No obvious pneumonia 9/04>> MRI brain: No acute intracranial abnormality.  Advanced cerebral/cerebellar atrophy 9/04-9/5>> LTM EEG: No seizures. 9/05>> TSH: 1.3 9/5-9/6>> LTM EEG: Negative. 9/06>> CTA chest: No PE-no PNA. 9/07>> RPR: Nonreactive 9/10>> bilateral lower extremity Doppler: No DVT 9/19>> Echo: EF 60-65%. 9/23>> CXR: No PNA 9/25>> CT head: No acute intracranial process  Significant microbiology data: 9/23>> COVID PCR positive 9/23>> blood culture: Negative  Procedures: None   Consults: Neurology, psychiatry  Subjective:   Unchanged over the past several days.  Objective: Vitals: Blood pressure 92/68, pulse 64, temperature 98.1 F (36.7 C), temperature source Oral, resp. rate 17, SpO2 93 %.   Exam: Gen Exam:not in any distress HEENT:atraumatic, normocephalic Chest: B/L clear to auscultation anteriorly CVS:S1S2  regular Abdomen:soft non tender, non distended Extremities:no edema Neurology: Non focal Skin: no rash   Assessment/Plan: Acute encephalopathy Dementia Remains Pleasantly confused-on occasion follow simple commands. Thought to have undiagnosed dementia with significant cerebral/cerebellar atrophy on imaging studies.  No evidence of seizures-no further recommendations from neurology/psychiatry.  Elevated D-dimer  CTA chest/lower extremity Dopplers negative for VTE.  COVID-19 infection:  Afebrile overnight-no clinical signs of pneumonia.  On room air.  Continue to monitor off treatment.  Dementia:  Significant atrophy on neuroimaging-chronic finding-neurology suspects patient has progressive dementia.  TSH/RPR stable-B12 being supplemented.  Await SNF bed , [name] cleared by psych for placement.  Vitamin B12 deficiency:  Continue supplementation.  Bipolar disorder:  Appreciate psych input-continue Haldol .  Per psych requires SNF/memory care.  Sinus tachycardia  Resolved-unclear etiology but thought to be due to dehydration.    Tobacco abuse: Continue transdermal nicotine  BMI: Estimated body mass index is 25.62 kg/m as calculated from the following:   Height as of an earlier encounter on 12/24/21: 5\' 6"  (1.676 m).   Weight as of an earlier encounter on 12/24/21: 72 kg.   Code status:   Code Status: Prior   DVT Prophylaxis: enoxaparin (LOVENOX) injection 40 mg Start: 12/26/21 1200    Family Communication: Mother-[phone]-updated at bedside on 10/4   Disposition Plan: Status is: Inpt Planned Discharge Destination: Memory care/SNF-awaiting bed.  Remains medically stable for transfer.  Diet: Diet Order             Diet regular Room service appropriate? No; Fluid consistency: Thin  Diet effective now                    MEDICATIONS: Scheduled Meds:  atropine  1 drop Sublingual TID  vitamin B-12  1,000 mcg Oral Daily   docusate sodium  200 mg Oral BID    enoxaparin (LOVENOX) injection  40 mg Subcutaneous Q24H   feeding supplement (KATE FARMS STANDARD 1.4)  325 mL Oral BID BM   haloperidol  1 mg Oral Daily   And   haloperidol  2 mg Oral QHS   hydrocerin   Topical BID   metoprolol tartrate  25 mg Oral BID   nicotine  14 mg Transdermal Daily   polyethylene glycol  17 g Oral BID   scopolamine  1 patch Transdermal Q72H   Continuous Infusions: PRN Meds:.acetaminophen, alum & mag hydroxide-simeth, fluticasone, hydrOXYzine, LORazepam, magnesium hydroxide, metoprolol tartrate, OLANZapine zydis **AND** ziprasidone, ondansetron, mouth rinse   I have personally reviewed following labs and imaging studies  LABORATORY DATA:  Recent Labs  Lab 01/20/22 1040  WBC 4.8  HGB 13.3  HCT 38.6  PLT 381  MCV 87.5  MCH 30.2  MCHC 34.5  RDW 12.8     Recent Labs  Lab 01/20/22 1040  NA 139  K 4.4  CL 105  CO2 23  GLUCOSE 121*  BUN 11  CREATININE 1.19*  CALCIUM 9.5    Lab Results  Component Value Date   TSH 1.468 01/09/2022    RADIOLOGY STUDIES/RESULTS: No results found.   LOS: 30 days   Signature  Oren Binet M.D on 01/25/2022 at 12:59 PM   -  To page go to [email or web address]

## 2022-01-25 NOTE — Progress Notes (Signed)
Mobility Specialist Progress Note:   01/25/22 0920  Mobility  Activity Ambulated with assistance in hallway  Activity Response Tolerated well  Distance Ambulated (ft) 400 ft  $Mobility charge 1 Mobility  Level of Assistance Contact guard assist, steadying assist  Assistive Device Other (Comment) (HHA)  Mobility Referral Yes   Pt eager for mobility session. Required HHA for steadying. Pt back in bed with all needs met.   Nelta Numbers Acute Rehab Secure Chat or Office Phone: [phone]

## 2022-01-25 NOTE — Progress Notes (Signed)
Speech Language Pathology Treatment:    Patient Details Name: Natalie Brown MRN: [id number] DOB: Dec 20, 1965 Today's Date: 01/25/2022 Time:  -      Pt is not appropriate for a speech-language-cognitive evaluation at this time. She would not respond to any of SLP's questions. Distracted by her lunch on her tray saying "do I have creamer?"  Feel pt will be able to respond appropriately when meds with psychiatrist/medical staff are able to be sorted out.      Houston Siren  01/25/2022, 12:24 PM  Orbie Pyo Colvin Caroli.Ed Product/process development scientist [phone]

## 2022-01-26 NOTE — Plan of Care (Signed)

## 2022-01-26 NOTE — Progress Notes (Signed)
PROGRESS NOTE        PATIENT DETAILS Name: Natalie Brown Age: 56 y.o. Sex: female Date of Birth: Apr 24, 1965 Admit Date: 12/24/2021 Admitting Physician Dewayne Shorter Levora Dredge, MD [id number], [name], NP  Brief Summary: Patient is a 56 y.o.  female with history of bipolar disorder-who was hospitalized at [facility] received a Ativan challenge for possible catatonia-with minimal improvement-due to persistent altered mental status/tachycardia-patient was admitted to [facility] service for further evaluation and treatment.  No significant finding regarding her tachycardia work-up, 2D echo within normal limit, no significant electrolyte abnormalities.  Evaluated by psychiatry-and unfortunately-patient did not meet criteria to go back to [facility].  While awaiting appropriate disposition-small course complicated by acute COVID-19 infection.  Significant events: 9/4>> transferred to [facility]-from [facility]-Altered-no improvement after Ativan challenge. 9/22>> febrile 9/23>> COVID-19 positive  Significant studies: 9/04>> vitamin B12: 290 9/04>> CXR: No obvious pneumonia 9/04>> MRI brain: No acute intracranial abnormality.  Advanced cerebral/cerebellar atrophy 9/04-9/5>> LTM EEG: No seizures. 9/05>> TSH: 1.3 9/5-9/6>> LTM EEG: Negative. 9/06>> CTA chest: No PE-no PNA. 9/07>> RPR: Nonreactive 9/10>> bilateral lower extremity Doppler: No DVT 9/19>> Echo: EF 60-65%. 9/23>> CXR: No PNA 9/25>> CT head: No acute intracranial process  Significant microbiology data: 9/23>> COVID PCR positive 9/23>> blood culture: Negative  Procedures: None   Consults: Neurology, psychiatry  Subjective:  Patient remains in bed appears to be in no distress, overall pleasantly confused denies any headache chest or abdominal pain.  Objective: Vitals: Blood pressure 115/61, pulse 97, temperature 97.8 F (36.6 C), temperature source Oral, resp. rate 17, SpO2 97 %.   Exam:  Awake but remains pleasantly  confused, no new F.N deficits,   [name].AT,PERRAL Supple Neck, No JVD,   Symmetrical Chest wall movement, Good air movement bilaterally, CTAB RRR,No Gallops, Rubs or new Murmurs,  +ve B.Sounds, Abd Soft, No tenderness,   No Cyanosis, Clubbing or edema    Assessment/Plan:  Metabolic encephalopathy in a patient with early onset advanced dementia - her acute metabolic encephalopathy has resolved now she is at her baseline unfortunately her baseline is pretty poor due to early onset advanced dementia, she has been seen by neurology and psychiatry.  Her MRI brain was positive for significant cerebral and cerebellar atrophy, continue supportive care.  Will require placement.      Dementia:  Significant atrophy on neuroimaging-chronic finding-neurology suspects patient has progressive dementia.  TSH/RPR stable-B12 being supplemented.  Await SNF bed , [name] cleared by psych for placement.  Vitamin B12 deficiency: Continue supplementation.  Bipolar disorder:  Appreciate psych input-continue Haldol .  Per psych requires SNF/memory care.  Elevated D-dimer  - CTA chest/lower extremity Dopplers negative for VTE.  COVID-19 infection:  Afebrile overnight-no clinical signs of pneumonia.  On room air.  Continue to monitor off treatment.  This problem has resolved.  Sinus tachycardia  Resolved-unclear etiology but thought to be due to dehydration.    Tobacco abuse: Continue transdermal nicotine  BMI: Estimated body mass index is 25.62 kg/m as calculated from the following:   Height as of an earlier encounter on 12/24/21: 5\' 6"  (1.676 m).   Weight as of an earlier encounter on 12/24/21: 72 kg.   Code status:   Code Status: Prior   DVT Prophylaxis: enoxaparin (LOVENOX) injection 40 mg Start: 12/26/21 1200    Family Communication: [phone]-updated at bedside on 10/4   Disposition Plan:  Status is: Inpt Planned Discharge Destination: Memory care/SNF-awaiting bed.  Remains medically stable  for transfer.  Diet: Diet Order             Diet regular Room service appropriate? No; Fluid consistency: Thin  Diet effective now                    MEDICATIONS: Scheduled Meds:  atropine  1 drop Sublingual TID   vitamin B-12  1,000 mcg Oral Daily   docusate sodium  200 mg Oral BID   enoxaparin (LOVENOX) injection  40 mg Subcutaneous Q24H   feeding supplement (KATE FARMS STANDARD 1.4)  325 mL Oral BID BM   haloperidol  1 mg Oral Daily   And   haloperidol  2 mg Oral QHS   hydrocerin   Topical BID   metoprolol tartrate  25 mg Oral BID   nicotine  14 mg Transdermal Daily   polyethylene glycol  17 g Oral BID   scopolamine  1 patch Transdermal Q72H   Continuous Infusions: PRN Meds:.acetaminophen, alum & mag hydroxide-simeth, fluticasone, hydrOXYzine, LORazepam, magnesium hydroxide, metoprolol tartrate, OLANZapine zydis **AND** ziprasidone, ondansetron, mouth rinse   I have personally reviewed following labs and imaging studies  LABORATORY DATA:  Recent Labs  Lab 01/20/22 1040  WBC 4.8  HGB 13.3  HCT 38.6  PLT 381  MCV 87.5  MCH 30.2  MCHC 34.5  RDW 12.8    Recent Labs  Lab 01/20/22 1040  NA 139  K 4.4  CL 105  CO2 23  GLUCOSE 121*  BUN 11  CREATININE 1.19*  CALCIUM 9.5   Lab Results  Component Value Date   TSH 1.468 01/09/2022    RADIOLOGY STUDIES/RESULTS: No results found.   LOS: 31 days   Signature  Lala Lund M.D on 01/26/2022 at 9:15 AM   -  To page go to [email or web address]

## 2022-01-27 LAB — BASIC METABOLIC PANEL
Anion gap: 9 (ref 5–15)
BUN: 9 mg/dL (ref 6–20)
CO2: 25 mmol/L (ref 22–32)
Calcium: 8.9 mg/dL (ref 8.9–10.3)
Chloride: 106 mmol/L (ref 98–111)
Creatinine, Ser: 1.05 mg/dL — ABNORMAL HIGH (ref 0.44–1.00)
GFR, Estimated: >60 mL/min (ref >60)
Glucose, Bld: 97 mg/dL (ref 70–99)
Potassium: 4 mmol/L (ref 3.5–5.1)
Sodium: 140 mmol/L (ref 135–145)

## 2022-01-27 LAB — CBC
HCT: 39.1 % (ref 36.0–46.0)
Hemoglobin: 12.7 g/dL (ref 12.0–15.0)
MCH: 29.7 pg (ref 26.0–34.0)
MCHC: 32.5 g/dL (ref 30.0–36.0)
MCV: 91.6 fL (ref 80.0–100.0)
Platelets: 280 10*3/uL (ref 150–400)
RBC: 4.27 MIL/uL (ref 3.87–5.11)
RDW: 12.9 % (ref 11.5–15.5)
WBC: 5.2 10*3/uL (ref 4.0–10.5)
nRBC: 0 % (ref 0.0–0.2)

## 2022-01-27 LAB — MAGNESIUM: Magnesium: 2.1 mg/dL (ref 1.7–2.4)

## 2022-01-27 NOTE — Progress Notes (Signed)
Mobility Specialist Progress Note:   01/27/22 1308  Mobility  Activity Dangled on edge of bed  Activity Response Tolerated fair  $Mobility charge 1 Mobility  Level of Assistance Modified independent, requires aide device or extra time  Assistive Device None  Mobility Referral Yes   Pt received in bed and agreeable. Pt Max for cueing and refocusing. No complaints. Husband was inconsolable during session. Pt left in bed with all needs met, call bell in reach, and family in room.   Chancie Lampert Mobility Specialist-Acute Rehab Secure Chat only

## 2022-01-27 NOTE — Progress Notes (Signed)
PROGRESS NOTE        PATIENT DETAILS Name: Natalie Brown Age: 56 y.o. Sex: female Date of Birth: 1965-07-03 Admit Date: 12/24/2021 Admitting Physician Dewayne Shorter Levora Dredge, MD TIR:[id number], [name], NP  Brief Summary: Patient is a 56 y.o.  female with history of bipolar disorder-who was hospitalized at [facility] received a Ativan challenge for possible catatonia-with minimal improvement-due to persistent altered mental status/tachycardia-patient was admitted to [facility] service for further evaluation and treatment.  No significant finding regarding her tachycardia work-up, 2D echo within normal limit, no significant electrolyte abnormalities.  Evaluated by psychiatry-and unfortunately-patient did not meet criteria to go back to [facility].  While awaiting appropriate disposition-small course complicated by acute COVID-19 infection.  Significant events: 9/4>> transferred to [facility]-from [facility]-Altered-no improvement after Ativan challenge. 9/22>> febrile 9/23>> COVID-19 positive  Significant studies: 9/04>> vitamin B12: 290 9/04>> CXR: No obvious pneumonia 9/04>> MRI brain: No acute intracranial abnormality.  Advanced cerebral/cerebellar atrophy 9/04-9/5>> LTM EEG: No seizures. 9/05>> TSH: 1.3 9/5-9/6>> LTM EEG: Negative. 9/06>> CTA chest: No PE-no PNA. 9/07>> RPR: Nonreactive 9/10>> bilateral lower extremity Doppler: No DVT 9/19>> Echo: EF 60-65%. 9/23>> CXR: No PNA 9/25>> CT head: No acute intracranial process  Significant microbiology data: 9/23>> COVID PCR positive 9/23>> blood culture: Negative  Procedures: None   Consults: Neurology, psychiatry  Subjective: Patient in bed appears to be in no distress, having breakfast, denies any headache or chest pain.  Objective: Vitals: Blood pressure 111/67, pulse 80, temperature 97.6 F (36.4 C), temperature source Oral, resp. rate 18, SpO2 95 %.   Exam:  Awake but remains pleasantly confused, no new F.N deficits,    [employer].AT,PERRAL Supple Neck, No JVD,   Symmetrical Chest wall movement, Good air movement bilaterally, CTAB RRR,No Gallops, Rubs or new Murmurs,  +ve B.Sounds, Abd Soft, No tenderness,   No Cyanosis, Clubbing or edema     Assessment/Plan:  Metabolic encephalopathy in a patient with early onset advanced dementia - her acute metabolic encephalopathy has resolved now she is at her baseline unfortunately her baseline is pretty poor due to early onset advanced dementia, she has been seen by neurology and psychiatry.  Her MRI brain was positive for significant cerebral and cerebellar atrophy, continue supportive care.  Will require placement.      Dementia:  Significant atrophy on neuroimaging-chronic finding-neurology suspects patient has progressive dementia.  TSH/RPR stable-B12 being supplemented.  Await SNF bed , [name] cleared by psych for placement.  Vitamin B12 deficiency: Continue supplementation.  Bipolar disorder:  Appreciate psych input-continue Haldol .  Per psych requires SNF/memory care.  Elevated D-dimer  - CTA chest/lower extremity Dopplers negative for VTE.  COVID-19 infection:  Afebrile overnight-no clinical signs of pneumonia.  On room air.  Continue to monitor off treatment.  This problem has resolved.  Sinus tachycardia  Resolved-unclear etiology but thought to be due to dehydration.    Tobacco abuse: Continue transdermal nicotine  BMI: Estimated body mass index is 25.62 kg/m as calculated from the following:   Height as of an earlier encounter on 12/24/21: 5\' 6"  (1.676 m).   Weight as of an earlier encounter on 12/24/21: 72 kg.   Code status:   Code Status: Prior   DVT Prophylaxis: enoxaparin (LOVENOX) injection 40 mg Start: 12/26/21 1200    Family Communication: Mother-[phone]-updated at bedside on 10/4   Disposition Plan: Status is: Inpt  Planned Discharge Destination: Memory care/SNF-awaiting bed.  Remains medically stable for transfer.  Diet: Diet  Order             Diet regular Room service appropriate? No; Fluid consistency: Thin  Diet effective now                    MEDICATIONS: Scheduled Meds:  atropine  1 drop Sublingual TID   vitamin B-12  1,000 mcg Oral Daily   docusate sodium  200 mg Oral BID   enoxaparin (LOVENOX) injection  40 mg Subcutaneous Q24H   feeding supplement (KATE FARMS STANDARD 1.4)  325 mL Oral BID BM   haloperidol  1 mg Oral Daily   And   haloperidol  2 mg Oral QHS   hydrocerin   Topical BID   metoprolol tartrate  25 mg Oral BID   nicotine  14 mg Transdermal Daily   polyethylene glycol  17 g Oral BID   scopolamine  1 patch Transdermal Q72H   Continuous Infusions: PRN Meds:.acetaminophen, alum & mag hydroxide-simeth, fluticasone, hydrOXYzine, LORazepam, magnesium hydroxide, metoprolol tartrate, OLANZapine zydis **AND** ziprasidone, ondansetron, mouth rinse   I have personally reviewed following labs and imaging studies  LABORATORY DATA:  Recent Labs  Lab 01/27/22 0000  WBC 5.2  HGB 12.7  HCT 39.1  PLT 280  MCV 91.6  MCH 29.7  MCHC 32.5  RDW 12.9    Recent Labs  Lab 01/27/22 0000  NA 140  K 4.0  CL 106  CO2 25  GLUCOSE 97  BUN 9  CREATININE 1.05*  CALCIUM 8.9  MG 2.1   Lab Results  Component Value Date   TSH 1.468 01/09/2022    RADIOLOGY STUDIES/RESULTS: No results found.   LOS: 32 days   Signature  Lala Lund M.D on 01/27/2022 at 10:44 AM   -  To page go to [email or web address]

## 2022-01-28 NOTE — Progress Notes (Signed)
Mobility Specialist Progress Note   01/28/22 1500  Mobility  Activity Ambulated with assistance in room;Dangled on edge of bed  Level of Assistance Contact guard assist, steadying assist  Assistive Device None (HHA)  Distance Ambulated (ft) 15 ft  Range of Motion/Exercises Active;All extremities  Activity Response Tolerated well   Patient received standing in doorway presenting with confusion and verbalizing minimally. Assisted patient back to bed with min guard assisted. Was left dangling EOB with all needs met, call bell in reach.   Natalie Brown, [name], [name]  BNLWH:[phone] Office: [phone]

## 2022-01-28 NOTE — Progress Notes (Signed)
Mobility Specialist Progress Note:   01/28/22 1533  Mobility  Activity Ambulated with assistance in hallway  Level of Assistance Contact guard assist, steadying assist  Assistive Device Other (Comment) (HHA)  Distance Ambulated (ft) 450 ft  Activity Response Tolerated well  Mobility Referral Yes  $Mobility charge 1 Mobility   Pt received ambulating in hallway and confused. After reading name badge, requested to walk in hall. No complaints. Pt left in bed with all needs met, call bell in reach, and bed alarm on.   Sarthak Rubenstein Mobility Specialist-Acute Rehab Secure Chat only

## 2022-01-28 NOTE — Progress Notes (Signed)
PROGRESS NOTE        PATIENT DETAILS Name: Natalie Brown Age: 56 y.o. Sex: female Date of Birth: 1966-04-20 Admit Date: 12/24/2021 Admitting Physician Dewayne Shorter Levora Dredge, MD RKY:[id number], [name], NP  Brief Summary: Patient is a 56 y.o.  female with history of bipolar disorder-who was hospitalized at [facility] received a Ativan challenge for possible catatonia-with minimal improvement-due to persistent altered mental status/tachycardia-patient was admitted to [facility] service for further evaluation and treatment.  No significant finding regarding her tachycardia work-up, 2D echo within normal limit, no significant electrolyte abnormalities.  Evaluated by psychiatry-and unfortunately-patient did not meet criteria to go back to [facility].  While awaiting appropriate disposition-small course complicated by acute COVID-19 infection.  Significant events: 9/4>> transferred to [facility]from [facility]-Altered-no improvement after Ativan challenge. 9/22>> febrile 9/23>> COVID-19 positive  Significant studies: 9/04>> vitamin B12: 290 9/04>> CXR: No obvious pneumonia 9/04>> MRI brain: No acute intracranial abnormality.  Advanced cerebral/cerebellar atrophy 9/04-9/5>> LTM EEG: No seizures. 9/05>> TSH: 1.3 9/5-9/6>> LTM EEG: Negative. 9/06>> CTA chest: No PE-no PNA. 9/07>> RPR: Nonreactive 9/10>> bilateral lower extremity Doppler: No DVT 9/19>> Echo: EF 60-65%. 9/23>> CXR: No PNA 9/25>> CT head: No acute intracranial process  Significant microbiology data: 9/23>> COVID PCR positive 9/23>> blood culture: Negative  Procedures: None   Consults: Neurology, psychiatry  Subjective: Patient in bed appears to be in no distress, having breakfast, denies any headache or chest pain.  Objective: Vitals: Blood pressure (!) 85/58, pulse 62, temperature 98 F (36.7 C), temperature source Oral, resp. rate 18, SpO2 98 %.   Exam:  Awake but remains pleasantly confused, no new F.N deficits,    [place].AT,PERRAL Supple Neck, No JVD,   Symmetrical Chest wall movement, Good air movement bilaterally, CTAB RRR,No Gallops, Rubs or new Murmurs,  +ve B.Sounds, Abd Soft, No tenderness,   No Cyanosis, Clubbing or edema    Assessment/Plan:  Metabolic encephalopathy in a patient with early onset advanced dementia - her acute metabolic encephalopathy has resolved now she is at her baseline unfortunately her baseline is pretty poor due to early onset advanced dementia, she has been seen by neurology and psychiatry.  Her MRI brain was positive for significant cerebral and cerebellar atrophy, continue supportive care.  Will require placement.      Dementia:  Significant atrophy on neuroimaging-chronic finding-neurology suspects patient has progressive dementia.  TSH/RPR stable-B12 being supplemented.  Await SNF bed , [name] cleared by psych for placement.  Vitamin B12 deficiency: Continue supplementation.  Bipolar disorder:  Appreciate psych input-continue Haldol .  Per psych requires SNF/memory care.  Elevated D-dimer  - CTA chest/lower extremity Dopplers negative for VTE.  COVID-19 infection:  Afebrile overnight-no clinical signs of pneumonia.  On room air.  Continue to monitor off treatment.  This problem has resolved.  Sinus tachycardia  Resolved-unclear etiology but thought to be due to dehydration.    Tobacco abuse: Continue transdermal nicotine  BMI: Estimated body mass index is 25.62 kg/m as calculated from the following:   Height as of an earlier encounter on 12/24/21: 5\' 6"  (1.676 m).   Weight as of an earlier encounter on 12/24/21: 72 kg.   Code status:   Code Status: Prior   DVT Prophylaxis: enoxaparin (LOVENOX) injection 40 mg Start: 12/26/21 1200    Family Communication: [phone]-updated at bedside on 01/28/2022   Disposition Plan: Status is: Inpt  Planned Discharge Destination: Memory care/SNF-awaiting bed.  Remains medically stable for  transfer.  Diet: Diet Order             Diet regular Room service appropriate? No; Fluid consistency: Thin  Diet effective now                    MEDICATIONS: Scheduled Meds:  atropine  1 drop Sublingual TID   vitamin B-12  1,000 mcg Oral Daily   docusate sodium  200 mg Oral BID   enoxaparin (LOVENOX) injection  40 mg Subcutaneous Q24H   feeding supplement (KATE FARMS STANDARD 1.4)  325 mL Oral BID BM   haloperidol  1 mg Oral Daily   And   haloperidol  2 mg Oral QHS   hydrocerin   Topical BID   metoprolol tartrate  25 mg Oral BID   nicotine  14 mg Transdermal Daily   polyethylene glycol  17 g Oral BID   scopolamine  1 patch Transdermal Q72H   Continuous Infusions: PRN Meds:.acetaminophen, alum & mag hydroxide-simeth, fluticasone, hydrOXYzine, LORazepam, magnesium hydroxide, metoprolol tartrate, OLANZapine zydis **AND** ziprasidone, ondansetron, mouth rinse   I have personally reviewed following labs and imaging studies  LABORATORY DATA:  Recent Labs  Lab 01/27/22 0000  WBC 5.2  HGB 12.7  HCT 39.1  PLT 280  MCV 91.6  MCH 29.7  MCHC 32.5  RDW 12.9    Recent Labs  Lab 01/27/22 0000  NA 140  K 4.0  CL 106  CO2 25  GLUCOSE 97  BUN 9  CREATININE 1.05*  CALCIUM 8.9  MG 2.1   Lab Results  Component Value Date   TSH 1.468 01/09/2022    RADIOLOGY STUDIES/RESULTS: No results found.   LOS: 33 days   Signature  Lala Lund M.D on 01/28/2022 at 9:03 AM   -  To page go to [email or web address]

## 2022-01-28 NOTE — Progress Notes (Signed)
Mobility Specialist: Progress Note   01/28/22 1417  Mobility  Activity Ambulated with assistance in hallway  Level of Assistance Contact guard assist, steadying assist  Assistive Device Other (Comment) (HHA)  Distance Ambulated (ft) 350 ft  Activity Response Tolerated well  $Mobility charge 1 Mobility   Received pt in bed having no complaints and agreeable to mobility. Pt was asymptomatic throughout ambulation and returned to room w/o fault. Left in bed w/ call bell in reach and all needs met.  [facility] Adja Ruff Mobility Specialist Mobility Specialist [address]: [phone]

## 2022-01-29 MED ORDER — HALOPERIDOL 1 MG PO TABS
1.0000 mg | ORAL_TABLET | Freq: Two times a day (BID) | ORAL | Status: DC
  Administered 2022-01-29 – 2022-06-22 (×262): 1 mg via ORAL
  Filled 2022-01-29 (×288): qty 1

## 2022-01-29 NOTE — Progress Notes (Signed)
Mobility Specialist Progress Note:   01/29/22 0925  Mobility  Activity Ambulated with assistance to bathroom  Level of Assistance Contact guard assist, steadying assist  Assistive Device Other (Comment) (HHA)  Distance Ambulated (ft) 30 ft  Activity Response Tolerated well  Mobility Referral Yes  $Mobility charge 1 Mobility   Pt requesting to go to [facility]. Required HHA for steadying throughout. Will f/u for hallway ambulation.   Nelta Numbers Acute Rehab Secure Chat or Office Phone: [phone]

## 2022-01-29 NOTE — Progress Notes (Signed)
PROGRESS NOTE        PATIENT DETAILS Name: Natalie Brown Age: 56 y.o. Sex: female Date of Birth: 23-Mar-1966 Admit Date: 12/24/2021 Admitting Physician Dewayne Shorter Levora Dredge, MD EXN:[id number], [name], NP  Brief Summary: Patient is a 56 y.o.  female with history of bipolar disorder-who was hospitalized at [facility] received a Ativan challenge for possible catatonia-with minimal improvement-due to persistent altered mental status/tachycardia-patient was admitted to [facility] service for further evaluation and treatment.  No significant finding regarding her tachycardia work-up, 2D echo within normal limit, no significant electrolyte abnormalities.  Evaluated by psychiatry-and unfortunately-patient did not meet criteria to go back to [facility].  While awaiting appropriate disposition-small course complicated by acute COVID-19 infection.  Significant events: 9/4>> transferred to [facility]from [facility]-Altered-no improvement after Ativan challenge. 9/22>> febrile 9/23>> COVID-19 positive  Significant studies: 9/04>> vitamin B12: 290 9/04>> CXR: No obvious pneumonia 9/04>> MRI brain: No acute intracranial abnormality.  Advanced cerebral/cerebellar atrophy 9/04-9/5>> LTM EEG: No seizures. 9/05>> TSH: 1.3 9/5-9/6>> LTM EEG: Negative. 9/06>> CTA chest: No PE-no PNA. 9/07>> RPR: Nonreactive 9/10>> bilateral lower extremity Doppler: No DVT 9/19>> Echo: EF 60-65%. 9/23>> CXR: No PNA 9/25>> CT head: No acute intracranial process  Significant microbiology data: 9/23>> COVID PCR positive 9/23>> blood culture: Negative  Procedures: None   Consults: Neurology, psychiatry  Subjective: Patient in bed, sleeping easily arousable, denies any headache or chest pain.  Objective: Vitals: Blood pressure 115/83, pulse 95, temperature 98.5 F (36.9 C), temperature source Oral, resp. rate 18, SpO2 96 %.   Exam:  Sleeping in bed, arousable, remains confused, no focal deficits [place].AT,PERRAL Supple  Neck, No JVD,   Symmetrical Chest wall movement, Good air movement bilaterally, CTAB RRR,No Gallops, Rubs or new Murmurs,  +ve B.Sounds, Abd Soft, No tenderness,   No Cyanosis, Clubbing or edema     Assessment/Plan:  Metabolic encephalopathy in a patient with early onset advanced dementia - her acute metabolic encephalopathy has resolved now she is at her baseline unfortunately her baseline is pretty poor due to early onset advanced dementia, she has been seen by neurology and psychiatry.  Her MRI brain was positive for significant cerebral and cerebellar atrophy, continue supportive care.  Will require placement.      Dementia:  Significant atrophy on neuroimaging-chronic finding-neurology suspects patient has progressive dementia.  TSH/RPR stable-B12 being supplemented.  Await SNF bed , [name] cleared by psych for placement.  Vitamin B12 deficiency: Continue supplementation.  Bipolar disorder:  Appreciate psych input-continue Haldol .  Per psych requires SNF/memory care.  Elevated D-dimer  - CTA chest/lower extremity Dopplers negative for VTE.  COVID-19 infection:  Afebrile overnight-no clinical signs of pneumonia.  On room air.  Continue to monitor off treatment.  This problem has resolved.  Sinus tachycardia  Resolved-unclear etiology but thought to be due to dehydration.    Tobacco abuse: Continue transdermal nicotine  BMI: Estimated body mass index is 25.62 kg/m as calculated from the following:   Height as of an earlier encounter on 12/24/21: 5\' 6"  (1.676 m).   Weight as of an earlier encounter on 12/24/21: 72 kg.   Code status:   Code Status: Prior   DVT Prophylaxis: enoxaparin (LOVENOX) injection 40 mg Start: 12/26/21 1200    Family Communication: [phone]-updated at bedside on 01/28/2022   Disposition Plan: Status is: Inpt Planned Discharge Destination: Memory care/SNF-awaiting bed.  Remains medically stable for transfer.  Diet: Diet Order              Diet regular Room service appropriate? No; Fluid consistency: Thin  Diet effective now                    MEDICATIONS: Scheduled Meds:  atropine  1 drop Sublingual TID   vitamin B-12  1,000 mcg Oral Daily   docusate sodium  200 mg Oral BID   enoxaparin (LOVENOX) injection  40 mg Subcutaneous Q24H   feeding supplement (KATE FARMS STANDARD 1.4)  325 mL Oral BID BM   haloperidol  1 mg Oral BID   hydrocerin   Topical BID   metoprolol tartrate  25 mg Oral BID   nicotine  14 mg Transdermal Daily   polyethylene glycol  17 g Oral BID   scopolamine  1 patch Transdermal Q72H   Continuous Infusions: PRN Meds:.acetaminophen, alum & mag hydroxide-simeth, fluticasone, hydrOXYzine, LORazepam, magnesium hydroxide, metoprolol tartrate, OLANZapine zydis **AND** ziprasidone, ondansetron, mouth rinse   I have personally reviewed following labs and imaging studies  LABORATORY DATA:  Recent Labs  Lab 01/27/22 0000  WBC 5.2  HGB 12.7  HCT 39.1  PLT 280  MCV 91.6  MCH 29.7  MCHC 32.5  RDW 12.9    Recent Labs  Lab 01/27/22 0000  NA 140  K 4.0  CL 106  CO2 25  GLUCOSE 97  BUN 9  CREATININE 1.05*  CALCIUM 8.9  MG 2.1   Lab Results  Component Value Date   TSH 1.468 01/09/2022    RADIOLOGY STUDIES/RESULTS: No results found.   LOS: 34 days   Signature  Lala Lund M.D on 01/29/2022 at 8:44 AM   -  To page go to [email or web address]

## 2022-01-29 NOTE — TOC Progression Note (Signed)
Transition of Care [facility]) - Progression Note    Patient Details  Name: Natalie Brown MRN: [id number] Date of Birth: May 18, 1965  Transition of Care [facility]) CM/SW [place], LCSW Phone Number: 01/29/2022, 3:29 PM  Clinical Narrative:    CSW received call from CCM with [facility]. She stated that she contacted DSS Placement to assist CSW and requested CSW contact [place]. CSW contacted that number but the gentleman who answered stated he did not know anyone by that name and he did not work for [employer]. CSW received a call from DSS Medicaid Dept and stated that CSW should contact Williemae Natter [phone] once the patient has been placed and then they will convert her Medicaid over to facility Medicaid.       Barriers to Discharge: Financial Resources, Inadequate or no insurance, Homeless with medical needs  Expected Discharge Plan and Services   In-house Referral: Clinical Social Work   Post Acute Care Choice: Nursing Home Living arrangements for the past 2 months: Homeless [facility]) Expected Discharge Date: 12/24/21                                     Social Determinants of Health (SDOH) Interventions    Readmission Risk Interventions     No data to display

## 2022-01-29 NOTE — Progress Notes (Signed)
Mobility Specialist Progress Note:   01/29/22 1050  Mobility  Activity Ambulated with assistance in hallway  Level of Assistance Contact guard assist, steadying assist  Assistive Device Other (Comment) (HHA)  Distance Ambulated (ft) 400 ft  Activity Response Tolerated well  Mobility Referral Yes  $Mobility charge 1 Mobility   Pt eager for mobility session, presenting with decr cognition this am. Pt required steadying assist via HHA throughout ambulation. Back in bed with all needs met, bed alarm on.   Nelta Numbers Acute Rehab Secure Chat or Office Phone: [phone]

## 2022-01-29 NOTE — [place] FL2 (Signed)
[place] MEDICAID FL2 LEVEL OF CARE SCREENING TOOL     IDENTIFICATION  Patient Name: Natalie Brown Birthdate: [birth year] Sex: female Admission Date (Current Location): 12/24/2021  [facility] and [place] Number:  Producer, television/film/video and Address:  [facility] [place], [address], [place], [place] [address]      Provider Number: [id number]  Attending Physician Name and Address:  Leroy Sea, MD  Relative Name and Phone Number:       Current Level of Care: Hospital Recommended Level of Care: Memory Care Prior Approval Number:    Date Approved/Denied:   PASRR Number:    Discharge Plan: Other (Comment) (Memory Care)    Current Diagnoses: Patient Active Problem List   Diagnosis Date Noted   Dementia with behavioral disturbance (HCC)    Altered mental state 12/26/2021   Tachycardia 12/25/2021   Tobacco abuse 12/24/2021   Acute encephalopathy 12/24/2021   Bipolar affective disorder, current episode manic with psychotic symptoms (HCC) 09/12/2021   Dental caries 06/29/2015   Cervical cancer screening 02/22/2014   Current smoker 02/22/2014   Urge incontinence of urine 12/30/2013   Need for prophylactic vaccination and inoculation against influenza 12/30/2013   Bipolar 1 disorder (HCC) 12/30/2013    Orientation RESPIRATION BLADDER Height & Weight     Self  Normal Continent Weight:   Height:     BEHAVIORAL SYMPTOMS/MOOD NEUROLOGICAL BOWEL NUTRITION STATUS   (Dementia, no behaviors)   Continent Diet (Regular)  AMBULATORY STATUS COMMUNICATION OF NEEDS Skin   Supervision Verbally Normal                       Personal Care Assistance Level of Assistance  Bathing, Feeding, Dressing Bathing Assistance: Limited assistance Feeding assistance: Limited assistance Dressing Assistance: Limited assistance     Functional Limitations Info             SPECIAL CARE FACTORS FREQUENCY                       Contractures Contractures  Info: Not present    Additional Factors Info  Code Status, Allergies, Psychotropic Code Status Info: Full Allergies Info: Penicillins Psychotropic Info: Haldol         Current Medications (01/29/2022):  This is the current hospital active medication list Current Facility-Administered Medications  Medication Dose Route Frequency Provider Last Rate Last Admin   acetaminophen (TYLENOL) tablet 650 mg  650 mg Oral Q6H PRN Orland Mustard, MD   650 mg at 01/20/22 2053   alum & mag hydroxide-simeth (MAALOX/MYLANTA) 200-200-20 MG/5ML suspension 30 mL  30 mL Oral Q4H PRN Orland Mustard, MD       atropine 1 % ophthalmic solution 1 drop  1 drop Sublingual TID [name], MD   1 drop at 01/29/22 1100   cyanocobalamin (VITAMIN B12) tablet 1,000 mcg  1,000 mcg Oral Daily Susa Raring K, MD   1,000 mcg at 01/29/22 1059   docusate sodium (COLACE) capsule 200 mg  200 mg Oral BID Leroy Sea, MD   200 mg at 01/29/22 1059   enoxaparin (LOVENOX) injection 40 mg  40 mg Subcutaneous Q24H Maretta Bees, MD   40 mg at 01/29/22 1406   feeding supplement (KATE FARMS STANDARD 1.4) liquid 325 mL  325 mL Oral BID BM Orland Mustard, MD   325 mL at 01/29/22 1106   fluticasone (FLONASE) 50 MCG/ACT nasal spray 1 spray  1 spray Each  Nare Daily PRN Orma Flaming, MD       haloperidol (HALDOL) tablet 1 mg  1 mg Oral BID Thurnell Lose, MD   1 mg at 01/29/22 1059   hydrocerin (EUCERIN) cream   Topical BID Orma Flaming, MD   Given at 01/29/22 1106   hydrOXYzine (ATARAX) tablet 25 mg  25 mg Oral TID PRN Orma Flaming, MD   25 mg at 01/29/22 1059   LORazepam (ATIVAN) tablet 0.5 mg  0.5 mg Oral Q6H PRN Orma Flaming, MD   0.5 mg at 01/28/22 2358   magnesium hydroxide (MILK OF MAGNESIA) suspension 30 mL  30 mL Oral Daily PRN Orma Flaming, MD       metoprolol tartrate (LOPRESSOR) injection 5 mg  5 mg Intravenous Q8H PRN Thurnell Lose, MD   5 mg at 01/02/22 1857   metoprolol tartrate (LOPRESSOR)  tablet 25 mg  25 mg Oral BID Thurnell Lose, MD   25 mg at 01/29/22 1059   nicotine (NICODERM CQ - dosed in mg/24 hr) patch 14 mg  14 mg Transdermal Daily Orma Flaming, MD   14 mg at 01/29/22 1101   OLANZapine zydis (ZYPREXA) disintegrating tablet 5 mg  5 mg Oral Q8H PRN Orma Flaming, MD   5 mg at 01/28/22 2259   And   ziprasidone (GEODON) injection 20 mg  20 mg Intramuscular PRN Orma Flaming, MD       ondansetron [facility]) tablet 4 mg  4 mg Oral Q8H PRN Orma Flaming, MD       Oral care mouth rinse  15 mL Mouth Rinse PRN Thurnell Lose, MD       polyethylene glycol (MIRALAX / GLYCOLAX) packet 17 g  17 g Oral BID Thurnell Lose, MD   17 g at 01/29/22 1100   scopolamine (TRANSDERM-SCOP) 1 MG/3DAYS 1.5 mg  1 patch Transdermal Q72H Thurnell Lose, MD   1.5 mg at 01/28/22 1155     Discharge Medications: Please see discharge summary for a list of discharge medications.  Relevant Imaging Results:  Relevant Lab Results:   Additional Information SSN: [place], [place]

## 2022-01-30 NOTE — TOC Progression Note (Signed)
Transition of Care [facility]) - Progression Note    Patient Details  Name: Natalie Brown MRN: [id number] Date of Birth: October 19, 1965  Transition of Care [facility]) CM/SW [place], LCSW Phone Number: 01/30/2022, 3:41 PM  Clinical Narrative:    CSW faxed referral to [facility]. Per Pamala Hurry they do not have any memory care beds available right now but will keep patient in mind.     Barriers to Discharge: Financial Resources, Inadequate or no insurance, Homeless with medical needs  Expected Discharge Plan and Services   In-house Referral: Clinical Social Work   Post Acute Care Choice: Nursing Home Living arrangements for the past 2 months: Homeless [facility]) Expected Discharge Date: 12/24/21                                     Social Determinants of Health (SDOH) Interventions    Readmission Risk Interventions     No data to display

## 2022-01-30 NOTE — Progress Notes (Signed)
Mobility Specialist Progress Note:   01/30/22 0950  Mobility  Activity Ambulated with assistance in hallway  Level of Assistance Contact guard assist, steadying assist  Assistive Device Other (Comment) (HHA)  Distance Ambulated (ft) 400 ft  Activity Response Tolerated well  Mobility Referral Yes  $Mobility charge 1 Mobility   Pt eager for mobility session. Required steadying assist throughout ambulation. Pt back in bed with all needs met, eating breakfast.   [place] or Office Phone: [phone]

## 2022-01-30 NOTE — Progress Notes (Signed)
PROGRESS NOTE        PATIENT DETAILS Name: Natalie Brown Age: 56 y.o. Sex: female Date of Birth: Mar 25, 1966 Admit Date: 12/24/2021 Admitting Physician Evalee Mutton Kristeen Mans, MD NID:[id number], [name], NP  Brief Summary: Patient is a 56 y.o.  female with history of bipolar disorder-who was hospitalized at [facility] received a Ativan challenge for possible catatonia-with minimal improvement-due to persistent altered mental status/tachycardia-patient was admitted to [facility] service for further evaluation and treatment.  No significant finding regarding her tachycardia work-up, 2D echo within normal limit, no significant electrolyte abnormalities.  Evaluated by psychiatry-and unfortunately-patient did not meet criteria to go back to [facility].  While awaiting appropriate disposition-small course complicated by acute [id number] infection.  Significant events: 9/4>> transferred to [facility]-from [facility]-Altered-no improvement after Ativan challenge. 9/22>> febrile 9/23>> COVID-19 positive  Significant studies: 9/04>> vitamin B12: 290 9/04>> CXR: No obvious pneumonia 9/04>> MRI brain: No acute intracranial abnormality.  Advanced cerebral/cerebellar atrophy 9/04-9/5>> LTM EEG: No seizures. 9/05>> TSH: 1.3 9/5-9/6>> LTM EEG: Negative. 9/06>> CTA chest: No PE-no PNA. 9/07>> RPR: Nonreactive 9/10>> bilateral lower extremity Doppler: No DVT 9/19>> Echo: EF 60-65%. 9/23>> CXR: No PNA 9/25>> CT head: No acute intracranial process  Significant microbiology data: 9/23>> COVID PCR positive 9/23>> blood culture: Negative  Procedures: None   Consults: Neurology, psychiatry  Subjective: Patient in bed awake, eating breakfast, denies any headache or chest pain.  No shortness of breath.  Objective: Vitals: Blood pressure 111/89, pulse 90, temperature 99 F (37.2 C), temperature source Oral, resp. rate 18, SpO2 99 %.   Exam:  Awake , No new F.N deficits, flat affect [place].AT,PERRAL Supple  Neck, No JVD,   Symmetrical Chest wall movement, Good air movement bilaterally, CTAB RRR,No Gallops, Rubs or new Murmurs,  +ve B.Sounds, Abd Soft, No tenderness,   No Cyanosis, Clubbing or edema   Assessment/Plan:  Metabolic encephalopathy in a patient with early onset advanced dementia - her acute metabolic encephalopathy has resolved now she is at her baseline unfortunately her baseline is pretty poor due to early onset advanced dementia, she has been seen by neurology and psychiatry.  Her MRI brain was positive for significant cerebral and cerebellar atrophy, continue supportive care.  Will require placement.      Dementia:  Significant atrophy on neuroimaging-chronic finding-neurology suspects patient has progressive dementia.  TSH/RPR stable-B12 being supplemented.  Await SNF bed , [name] cleared by psych for placement.  Vitamin B12 deficiency: Continue supplementation.  Bipolar disorder:  Appreciate psych input-continue Haldol .  Per psych requires SNF/memory care.  Elevated D-dimer  - CTA chest/lower extremity Dopplers negative for VTE.  COVID-19 infection:  Afebrile overnight-no clinical signs of pneumonia.  On room air.  Continue to monitor off treatment.  This problem has resolved.  Sinus tachycardia  Resolved-unclear etiology but thought to be due to dehydration.    Tobacco abuse: Continue transdermal nicotine  BMI: Estimated body mass index is 25.62 kg/m as calculated from the following:   Height as of an earlier encounter on 12/24/21: 5\' 6"  (1.676 m).   Weight as of an earlier encounter on 12/24/21: 72 kg.   Code status:   Code Status: Prior   DVT Prophylaxis: enoxaparin (LOVENOX) injection 40 mg Start: 12/26/21 1200    Family Communication: Mother-[phone]-updated at bedside on 01/28/2022   Disposition Plan: Status is: Inpt Planned Discharge Destination: Memory care/SNF-awaiting  bed.  Remains medically stable for transfer.  Diet: Diet Order              Diet regular Room service appropriate? No; Fluid consistency: Thin  Diet effective now                    MEDICATIONS: Scheduled Meds:  atropine  1 drop Sublingual TID   vitamin B-12  1,000 mcg Oral Daily   docusate sodium  200 mg Oral BID   enoxaparin (LOVENOX) injection  40 mg Subcutaneous Q24H   feeding supplement (KATE FARMS STANDARD 1.4)  325 mL Oral BID BM   haloperidol  1 mg Oral BID   hydrocerin   Topical BID   metoprolol tartrate  25 mg Oral BID   nicotine  14 mg Transdermal Daily   polyethylene glycol  17 g Oral BID   scopolamine  1 patch Transdermal Q72H   Continuous Infusions: PRN Meds:.acetaminophen, alum & mag hydroxide-simeth, fluticasone, hydrOXYzine, LORazepam, magnesium hydroxide, metoprolol tartrate, OLANZapine zydis **AND** ziprasidone, ondansetron, mouth rinse   I have personally reviewed following labs and imaging studies  LABORATORY DATA:  Recent Labs  Lab 01/27/22 0000  WBC 5.2  HGB 12.7  HCT 39.1  PLT 280  MCV 91.6  MCH 29.7  MCHC 32.5  RDW 12.9    Recent Labs  Lab 01/27/22 0000  NA 140  K 4.0  CL 106  CO2 25  GLUCOSE 97  BUN 9  CREATININE 1.05*  CALCIUM 8.9  MG 2.1   Lab Results  Component Value Date   TSH 1.468 01/09/2022    RADIOLOGY STUDIES/RESULTS: No results found.   LOS: 35 days   Signature  Susa Raring M.D on 01/30/2022 at 10:39 AM   -  To page go to [email or web address]

## 2022-01-30 NOTE — Progress Notes (Signed)
Mobility Specialist Progress Note:   01/30/22 0905  Mobility  Activity Ambulated with assistance to bathroom  Level of Assistance Minimal assist, patient does 75% or more  Assistive Device Other (Comment) (HHA)  Distance Ambulated (ft) 30 ft  Activity Response Tolerated well  Mobility Referral Yes  $Mobility charge 1 Mobility   Pt difficult to arouse this am. Once awake, pt requesting to go to BR. Session limited by cognition. Will return later for hopeful hallway ambulation.   Nelta Numbers Acute Rehab Secure Chat or Office Phone: [phone]

## 2022-01-31 NOTE — TOC Progression Note (Addendum)
Transition of Care [facility]) - Progression Note    Patient Details  Name: Natalie Brown MRN: [id number] Date of Birth: 12-Sep-1965  Transition of Care [facility]) CM/SW [place], LCSW Phone Number: 01/31/2022, 5:30 PM  Clinical Narrative:    CSW faxed referral to [facility] and Left voicemails. Emailed referral to [facility].   Update: Per [facility], they only accept patients 10 and older.      Barriers to Discharge: Financial Resources, Inadequate or no insurance, Homeless with medical needs  Expected Discharge Plan and Services   In-house Referral: Clinical Social Work   Post Acute Care Choice: Nursing Home Living arrangements for the past 2 months: Homeless [facility]) Expected Discharge Date: 12/24/21                                     Social Determinants of Health (SDOH) Interventions    Readmission Risk Interventions     No data to display

## 2022-01-31 NOTE — Progress Notes (Signed)
Mobility Specialist Progress Note:   01/31/22 0945  Mobility  Activity Ambulated with assistance in hallway  Level of Assistance Contact guard assist, steadying assist  Assistive Device Other (Comment) (HHA)  Distance Ambulated (ft) 400 ft  Activity Response Tolerated well  Mobility Referral Yes  $Mobility charge 1 Mobility   Pt eager for mobility session. Required CGA through HHA. Pt back in bed with all needs met.   Nelta Numbers Acute Rehab Secure Chat or Office Phone: [phone]

## 2022-01-31 NOTE — Plan of Care (Signed)
  Problem: Clinical Measurements: Goal: Ability to maintain clinical measurements within normal limits will improve Outcome: Progressing   

## 2022-01-31 NOTE — Progress Notes (Signed)
Mobility Specialist Progress Note:   01/31/22 0900  Mobility  Activity Ambulated with assistance to bathroom  Level of Assistance Contact guard assist, steadying assist  Assistive Device Other (Comment) (HHA)  Distance Ambulated (ft) 30 ft  Activity Response Tolerated well  Mobility Referral Yes  $Mobility charge 1 Mobility   Pt requesting to go to BR. Required HHA to stand and ambulate. Will f/u for hopeful ambulation.  Nelta Numbers Acute Rehab Secure Chat or Office Phone: [phone]

## 2022-01-31 NOTE — Progress Notes (Signed)
PROGRESS NOTE        PATIENT DETAILS Name: Natalie Brown Age: 56 y.o. Sex: female Date of Birth: Jul 29, 1965 Admit Date: 12/24/2021 Admitting Physician Evalee Mutton Kristeen Mans, MD [id number], [name], NP  Brief Summary: Patient is a 56 y.o.  female with history of bipolar disorder-who was hospitalized at [facility] received a Ativan challenge for possible catatonia-with minimal improvement-due to persistent altered mental status/tachycardia-patient was admitted to [facility] service for further evaluation and treatment.  No significant finding regarding her tachycardia work-up, 2D echo within normal limit, no significant electrolyte abnormalities.  Evaluated by psychiatry-and unfortunately-patient did not meet criteria to go back to [facility].  While awaiting appropriate disposition-small course complicated by acute [id number] infection.  Significant events: 9/4>> transferred to [facility]-from [facility]-Altered-no improvement after Ativan challenge. 9/22>> febrile 9/23>> COVID-19 positive  Significant studies: 9/04>> vitamin B12: 290 9/04>> CXR: No obvious pneumonia 9/04>> MRI brain: No acute intracranial abnormality.  Advanced cerebral/cerebellar atrophy 9/04-9/5>> LTM EEG: No seizures. 9/05>> TSH: 1.3 9/5-9/6>> LTM EEG: Negative. 9/06>> CTA chest: No PE-no PNA. 9/07>> RPR: Nonreactive 9/10>> bilateral lower extremity Doppler: No DVT 9/19>> Echo: EF 60-65%. 9/23>> CXR: No PNA 9/25>> CT head: No acute intracranial process  Significant microbiology data: 9/23>> COVID PCR positive 9/23>> blood culture: Negative  Procedures: None   Consults: Neurology, psychiatry  Subjective: Patient in bed, appears comfortable, denies any headache, no fever, no chest pain or pressure, no shortness of breath , no abdominal pain.     Objective: Vitals: Blood pressure 120/83, pulse 85, temperature 98.1 F (36.7 C), temperature source Oral, resp. rate 16, SpO2 95 %.   Exam:  Awake, baseline  confused, no focal deficits  [place].AT,PERRAL Supple Neck, No JVD,   Symmetrical Chest wall movement, Good air movement bilaterally, CTAB RRR,No Gallops, Rubs or new Murmurs,  +ve B.Sounds, Abd Soft, No tenderness,   No Cyanosis, Clubbing or edema    Assessment/Plan:  Metabolic encephalopathy in a patient with early onset advanced dementia - her acute metabolic encephalopathy has resolved now she is at her baseline unfortunately her baseline is pretty poor due to early onset advanced dementia, she has been seen by neurology and psychiatry.  Her MRI brain was positive for significant cerebral and cerebellar atrophy, continue supportive care.  Will require placement.      Dementia:  Significant atrophy on neuroimaging-chronic finding-neurology suspects patient has progressive dementia.  TSH/RPR stable-B12 being supplemented.  Await SNF bed , [name] cleared by psych for placement.  Vitamin B12 deficiency: Continue supplementation.  Bipolar disorder:  Appreciate psych input-continue Haldol .  Per psych requires SNF/memory care.  Elevated D-dimer  - CTA chest/lower extremity Dopplers negative for VTE.  COVID-19 infection:  Afebrile overnight-no clinical signs of pneumonia.  On room air.  Continue to monitor off treatment.  This problem has resolved.  Sinus tachycardia  Resolved-unclear etiology but thought to be due to dehydration.    Tobacco abuse: Continue transdermal nicotine  BMI: Estimated body mass index is 25.62 kg/m as calculated from the following:   Height as of an earlier encounter on 12/24/21: 5\' 6"  (1.676 m).   Weight as of an earlier encounter on 12/24/21: 72 kg.   Code status:   Code Status: Prior   DVT Prophylaxis: enoxaparin (LOVENOX) injection 40 mg Start: 12/26/21 1200    Family Communication: Mother-[phone]-updated at bedside on 01/28/2022   Disposition  Plan: Status is: Inpt Planned Discharge Destination: Memory care/SNF-awaiting bed.  Remains medically stable  for transfer.  Diet: Diet Order             Diet regular Room service appropriate? No; Fluid consistency: Thin  Diet effective now                    MEDICATIONS: Scheduled Meds:  atropine  1 drop Sublingual TID   vitamin B-12  1,000 mcg Oral Daily   docusate sodium  200 mg Oral BID   enoxaparin (LOVENOX) injection  40 mg Subcutaneous Q24H   feeding supplement (KATE FARMS STANDARD 1.4)  325 mL Oral BID BM   haloperidol  1 mg Oral BID   hydrocerin   Topical BID   metoprolol tartrate  25 mg Oral BID   nicotine  14 mg Transdermal Daily   polyethylene glycol  17 g Oral BID   scopolamine  1 patch Transdermal Q72H   Continuous Infusions: PRN Meds:.acetaminophen, alum & mag hydroxide-simeth, fluticasone, hydrOXYzine, LORazepam, magnesium hydroxide, metoprolol tartrate, OLANZapine zydis **AND** ziprasidone, ondansetron, mouth rinse   I have personally reviewed following labs and imaging studies  LABORATORY DATA:  Recent Labs  Lab 01/27/22 0000  WBC 5.2  HGB 12.7  HCT 39.1  PLT 280  MCV 91.6  MCH 29.7  MCHC 32.5  RDW 12.9    Recent Labs  Lab 01/27/22 0000  NA 140  K 4.0  CL 106  CO2 25  GLUCOSE 97  BUN 9  CREATININE 1.05*  CALCIUM 8.9  MG 2.1   Lab Results  Component Value Date   TSH 1.468 01/09/2022    RADIOLOGY STUDIES/RESULTS: No results found.   LOS: 36 days   Signature  Lala Lund M.D on 01/31/2022 at 10:37 AM   -  To page go to [email or web address]

## 2022-02-01 NOTE — Progress Notes (Signed)
Mobility Specialist Progress Note:   02/01/22 1700  Mobility  Activity Ambulated with assistance in hallway  Level of Assistance Contact guard assist, steadying assist  Assistive Device Other (Comment) (HHA)  Distance Ambulated (ft) 500 ft  Activity Response Tolerated well  Mobility Referral Yes  $Mobility charge 1 Mobility   Pt received ambulating in hallway with family. Upon seeing mobility specialist, reached hand out and took hand of mobility specialist before continuing to walk. Asymptomatic throughout ambulation. Pt left in bed with all needs met, call bell in reach, and family member in room.   Karalyn Kadel Mobility Specialist-Acute Rehab Secure Chat only

## 2022-02-01 NOTE — Progress Notes (Signed)
Mobility Specialist Progress Note:   02/01/22 0930  Mobility  Activity Ambulated with assistance in hallway  Level of Assistance Contact guard assist, steadying assist  Assistive Device Other (Comment) (HHA)  Distance Ambulated (ft) 300 ft  Activity Response Tolerated well  Mobility Referral Yes  $Mobility charge 1 Mobility   Pt eager for mobility session this am. Required CGA through HHA during ambulation. Pt back in bed with all needs met, bed alarm on.   Nelta Numbers Acute Rehab Secure Chat or Office Phone: [phone]

## 2022-02-01 NOTE — Progress Notes (Signed)
PROGRESS NOTE        PATIENT DETAILS Name: Natalie Brown Age: 56 y.o. Sex: female Date of Birth: 07-20-1965 Admit Date: 12/24/2021 Admitting Physician Dewayne Shorter Levora Dredge, MD IEP:[id number], [name], NP  Brief Summary: Patient is a 56 y.o.  female with history of bipolar disorder-who was hospitalized at [facility] received a Ativan challenge for possible catatonia-with minimal improvement-due to persistent altered mental status/tachycardia-patient was admitted to [facility] service for further evaluation and treatment.  No significant finding regarding her tachycardia work-up, 2D echo within normal limit, no significant electrolyte abnormalities.  Evaluated by psychiatry-and unfortunately-patient did not meet criteria to go back to [facility].  While awaiting appropriate disposition-small course complicated by acute COVID-19 infection.  Significant events: 9/4>> transferred to [facility]-from [facility]-Altered-no improvement after Ativan challenge. 9/22>> febrile 9/23>> COVID-19 positive  Significant studies: 9/04>> vitamin B12: 290 9/04>> CXR: No obvious pneumonia 9/04>> MRI brain: No acute intracranial abnormality.  Advanced cerebral/cerebellar atrophy 9/04-9/5>> LTM EEG: No seizures. 9/05>> TSH: 1.3 9/5-9/6>> LTM EEG: Negative. 9/06>> CTA chest: No PE-no PNA. 9/07>> RPR: Nonreactive 9/10>> bilateral lower extremity Doppler: No DVT 9/19>> Echo: EF 60-65%. 9/23>> CXR: No PNA 9/25>> CT head: No acute intracranial process  Significant microbiology data: 9/23>> COVID PCR positive 9/23>> blood culture: Negative  Procedures: None   Consults: Neurology, psychiatry  Subjective: Patient in bed, appears comfortable, denies any headache, no fever, no chest pain or pressure, no shortness of breath , no abdominal pain.     Objective: Vitals: Blood pressure 102/60, pulse 90, temperature 98.1 F (36.7 C), temperature source Oral, resp. rate 18, SpO2 98 %.   Exam:  Awake confused at  baseline, no focal deficits [place].AT,PERRAL Supple Neck, No JVD,   Symmetrical Chest wall movement, Good air movement bilaterally, CTAB RRR,No Gallops, Rubs or new Murmurs,  +ve B.Sounds, Abd Soft, No tenderness,   No Cyanosis, Clubbing or edema    Assessment/Plan:  Metabolic encephalopathy in a patient with early onset advanced dementia - her acute metabolic encephalopathy has resolved now she is at her baseline unfortunately her baseline is pretty poor due to early onset advanced dementia, she has been seen by neurology and psychiatry.  Her MRI brain was positive for significant cerebral and cerebellar atrophy, continue supportive care.  Will require placement.      Dementia:  Significant atrophy on neuroimaging-chronic finding-neurology suspects patient has progressive dementia.  TSH/RPR stable-B12 being supplemented.  Await SNF bed , [name] cleared by psych for placement.  Vitamin B12 deficiency: Continue supplementation.  Bipolar disorder:  Appreciate psych input-continue Haldol .  Per psych requires SNF/memory care.  Elevated D-dimer  - CTA chest/lower extremity Dopplers negative for VTE.  COVID-19 infection:  Afebrile overnight-no clinical signs of pneumonia.  On room air.  Continue to monitor off treatment.  This problem has resolved.  Sinus tachycardia  Resolved-unclear etiology but thought to be due to dehydration.    Tobacco abuse: Continue transdermal nicotine  BMI: Estimated body mass index is 25.62 kg/m as calculated from the following:   Height as of an earlier encounter on 12/24/21: 5\' 6"  (1.676 m).   Weight as of an earlier encounter on 12/24/21: 72 kg.   Code status:   Code Status: Prior   DVT Prophylaxis: enoxaparin (LOVENOX) injection 40 mg Start: 12/26/21 1200    Family Communication: [phone]-updated at bedside on 01/28/2022   Disposition  Plan: Status is: Inpt Planned Discharge Destination: Memory care/SNF-awaiting bed.  Remains medically stable  for transfer.  Diet: Diet Order             Diet regular Room service appropriate? No; Fluid consistency: Thin  Diet effective now                    MEDICATIONS: Scheduled Meds:  atropine  1 drop Sublingual TID   vitamin B-12  1,000 mcg Oral Daily   docusate sodium  200 mg Oral BID   enoxaparin (LOVENOX) injection  40 mg Subcutaneous Q24H   feeding supplement (KATE FARMS STANDARD 1.4)  325 mL Oral BID BM   haloperidol  1 mg Oral BID   hydrocerin   Topical BID   metoprolol tartrate  25 mg Oral BID   nicotine  14 mg Transdermal Daily   polyethylene glycol  17 g Oral BID   scopolamine  1 patch Transdermal Q72H   Continuous Infusions: PRN Meds:.acetaminophen, alum & mag hydroxide-simeth, fluticasone, hydrOXYzine, LORazepam, magnesium hydroxide, metoprolol tartrate, OLANZapine zydis **AND** ziprasidone, ondansetron, mouth rinse   I have personally reviewed following labs and imaging studies  LABORATORY DATA:  Recent Labs  Lab 01/27/22 0000  WBC 5.2  HGB 12.7  HCT 39.1  PLT 280  MCV 91.6  MCH 29.7  MCHC 32.5  RDW 12.9    Recent Labs  Lab 01/27/22 0000  NA 140  K 4.0  CL 106  CO2 25  GLUCOSE 97  BUN 9  CREATININE 1.05*  CALCIUM 8.9  MG 2.1   Lab Results  Component Value Date   TSH 1.468 01/09/2022    RADIOLOGY STUDIES/RESULTS: No results found.   LOS: 37 days   Signature  Lala Lund M.D on 02/01/2022 at 10:41 AM   -  To page go to [email or web address]

## 2022-02-01 NOTE — Progress Notes (Signed)
Mobility Specialist Progress Note:   02/01/22 1130  Mobility  Activity Ambulated with assistance in hallway  Level of Assistance Contact guard assist, steadying assist  Assistive Device Other (Comment) (HHA)  Distance Ambulated (ft) 300 ft  Activity Response Tolerated well  Mobility Referral Yes  $Mobility charge 1 Mobility   Pt received asking for second mobility session. Asx throughout session. Back in bed with all needs met.     Acute Rehab Secure Chat or Office Phone: [phone]  

## 2022-02-02 NOTE — Progress Notes (Signed)
PROGRESS NOTE        PATIENT DETAILS Name: Natalie Brown Age: 56 y.o. Sex: female Date of Birth: August 10, 1965 Admit Date: 12/24/2021 Admitting Physician Dewayne Shorter Levora Dredge, MD MWN:[id number], [name], NP  Brief Summary: Patient is a 56 y.o.  female with history of bipolar disorder-who was hospitalized at [facility] received a Ativan challenge for possible catatonia-with minimal improvement-due to persistent altered mental status/tachycardia-patient was admitted to [facility] service for further evaluation and treatment.  No significant finding regarding her tachycardia work-up, 2D echo within normal limit, no significant electrolyte abnormalities.  Evaluated by psychiatry-and unfortunately-patient did not meet criteria to go back to [facility].  While awaiting appropriate disposition-small course complicated by acute COVID-19 infection.  Significant events: 9/4>> transferred to [facility]from [facility]-Altered-no improvement after Ativan challenge. 9/22>> febrile 9/23>> COVID-19 positive  Significant studies: 9/04>> vitamin B12: 290 9/04>> CXR: No obvious pneumonia 9/04>> MRI brain: No acute intracranial abnormality.  Advanced cerebral/cerebellar atrophy 9/04-9/5>> LTM EEG: No seizures. 9/05>> TSH: 1.3 9/5-9/6>> LTM EEG: Negative. 9/06>> CTA chest: No PE-no PNA. 9/07>> RPR: Nonreactive 9/10>> bilateral lower extremity Doppler: No DVT 9/19>> Echo: EF 60-65%. 9/23>> CXR: No PNA 9/25>> CT head: No acute intracranial process  Significant microbiology data: 9/23>> COVID PCR positive 9/23>> blood culture: Negative  Procedures: None   Consults: Neurology, psychiatry  Subjective: Patient in bed, rightly sleepy but easily arousable, denies any headache or chest pain.   Objective: Vitals: Blood pressure 102/60, pulse 90, temperature 98.1 F (36.7 C), temperature source Oral, resp. rate 18, SpO2 98 %.   Exam:  Slightly sleepy but easily arousable, confused at baseline, no focal  deficits [place].AT,PERRAL Supple Neck, No JVD,   Symmetrical Chest wall movement, Good air movement bilaterally, CTAB RRR,No Gallops, Rubs or new Murmurs,  +ve B.Sounds, Abd Soft, No tenderness,   No Cyanosis, Clubbing or edema     Assessment/Plan:  Metabolic encephalopathy in a patient with early onset advanced dementia - her acute metabolic encephalopathy has resolved now she is at her baseline unfortunately her baseline is pretty poor due to early onset advanced dementia, she has been seen by neurology and psychiatry.  Her MRI brain was positive for significant cerebral and cerebellar atrophy, continue supportive care.  Will require placement.      Dementia:  Significant atrophy on neuroimaging-chronic finding-neurology suspects patient has progressive dementia.  TSH/RPR stable-B12 being supplemented.  Await SNF bed , [name] cleared by psych for placement.  Vitamin B12 deficiency: Continue supplementation.  Bipolar disorder:  Appreciate psych input-continue Haldol .  Per psych requires SNF/memory care.  Elevated D-dimer  - CTA chest/lower extremity Dopplers negative for VTE.  COVID-19 infection:  Afebrile overnight-no clinical signs of pneumonia.  On room air.  Continue to monitor off treatment.  This problem has resolved.  Sinus tachycardia  Resolved-unclear etiology but thought to be due to dehydration.    Tobacco abuse: Continue transdermal nicotine  BMI: Estimated body mass index is 25.62 kg/m as calculated from the following:   Height as of an earlier encounter on 12/24/21: 5\' 6"  (1.676 m).   Weight as of an earlier encounter on 12/24/21: 72 kg.   Code status:   Code Status: Prior   DVT Prophylaxis: enoxaparin (LOVENOX) injection 40 mg Start: 12/26/21 1200    Family Communication: Mother-[phone]-updated at bedside on 01/28/2022   Disposition Plan: Status is: Inpt Planned Discharge  Destination: Memory care/SNF-awaiting bed.  Remains medically stable for  transfer.  Diet: Diet Order             Diet regular Room service appropriate? No; Fluid consistency: Thin  Diet effective now                    MEDICATIONS: Scheduled Meds:  atropine  1 drop Sublingual TID   vitamin B-12  1,000 mcg Oral Daily   docusate sodium  200 mg Oral BID   enoxaparin (LOVENOX) injection  40 mg Subcutaneous Q24H   feeding supplement (KATE FARMS STANDARD 1.4)  325 mL Oral BID BM   haloperidol  1 mg Oral BID   hydrocerin   Topical BID   metoprolol tartrate  25 mg Oral BID   nicotine  14 mg Transdermal Daily   polyethylene glycol  17 g Oral BID   scopolamine  1 patch Transdermal Q72H   Continuous Infusions: PRN Meds:.acetaminophen, alum & mag hydroxide-simeth, fluticasone, hydrOXYzine, LORazepam, magnesium hydroxide, metoprolol tartrate, OLANZapine zydis **AND** ziprasidone, ondansetron, mouth rinse   I have personally reviewed following labs and imaging studies  LABORATORY DATA:  Recent Labs  Lab 01/27/22 0000  WBC 5.2  HGB 12.7  HCT 39.1  PLT 280  MCV 91.6  MCH 29.7  MCHC 32.5  RDW 12.9    Recent Labs  Lab 01/27/22 0000  NA 140  K 4.0  CL 106  CO2 25  GLUCOSE 97  BUN 9  CREATININE 1.05*  CALCIUM 8.9  MG 2.1   Lab Results  Component Value Date   TSH 1.468 01/09/2022    RADIOLOGY STUDIES/RESULTS: No results found.   LOS: 38 days   Signature  Lala Lund M.D on 02/02/2022 at 9:36 AM   -  To page go to [email or web address]

## 2022-02-03 LAB — BASIC METABOLIC PANEL
Anion gap: 9 (ref 5–15)
BUN: 15 mg/dL (ref 6–20)
CO2: 25 mmol/L (ref 22–32)
Calcium: 9 mg/dL (ref 8.9–10.3)
Chloride: 106 mmol/L (ref 98–111)
Creatinine, Ser: 1.23 mg/dL — ABNORMAL HIGH (ref 0.44–1.00)
GFR, Estimated: 52 mL/min — ABNORMAL LOW (ref >60)
Glucose, Bld: 97 mg/dL (ref 70–99)
Potassium: 3.9 mmol/L (ref 3.5–5.1)
Sodium: 140 mmol/L (ref 135–145)

## 2022-02-03 LAB — MAGNESIUM: Magnesium: 2.1 mg/dL (ref 1.7–2.4)

## 2022-02-03 LAB — CBC
HCT: 41 % (ref 36.0–46.0)
Hemoglobin: 13.9 g/dL (ref 12.0–15.0)
MCH: 30 pg (ref 26.0–34.0)
MCHC: 33.9 g/dL (ref 30.0–36.0)
MCV: 88.4 fL (ref 80.0–100.0)
Platelets: 310 10*3/uL (ref 150–400)
RBC: 4.64 MIL/uL (ref 3.87–5.11)
RDW: 12.7 % (ref 11.5–15.5)
WBC: 5.7 10*3/uL (ref 4.0–10.5)
nRBC: 0 % (ref 0.0–0.2)

## 2022-02-03 NOTE — Progress Notes (Signed)
PROGRESS NOTE        PATIENT DETAILS Name: Natalie Brown Age: 56 y.o. Sex: female Date of Birth: 28-May-1965 Admit Date: 12/24/2021 Admitting Physician Dewayne Shorter Levora Dredge, MD LFY:[id number], [name], NP  Brief Summary: Patient is a 56 y.o.  female with history of bipolar disorder-who was hospitalized at [facility] received a Ativan challenge for possible catatonia-with minimal improvement-due to persistent altered mental status/tachycardia-patient was admitted to [facility] service for further evaluation and treatment.  No significant finding regarding her tachycardia work-up, 2D echo within normal limit, no significant electrolyte abnormalities.  Evaluated by psychiatry-and unfortunately-patient did not meet criteria to go back to [facility].  While awaiting appropriate disposition-small course complicated by acute COVID-19 infection.  Significant events: 9/4>> transferred to [facility]-from [facility]-Altered-no improvement after Ativan challenge. 9/22>> febrile 9/23>> COVID-19 positive  Significant studies: 9/04>> vitamin B12: 290 9/04>> CXR: No obvious pneumonia 9/04>> MRI brain: No acute intracranial abnormality.  Advanced cerebral/cerebellar atrophy 9/04-9/5>> LTM EEG: No seizures. 9/05>> TSH: 1.3 9/5-9/6>> LTM EEG: Negative. 9/06>> CTA chest: No PE-no PNA. 9/07>> RPR: Nonreactive 9/10>> bilateral lower extremity Doppler: No DVT 9/19>> Echo: EF 60-65%. 9/23>> CXR: No PNA 9/25>> CT head: No acute intracranial process  Significant microbiology data: 9/23>> COVID PCR positive 9/23>> blood culture: Negative  Procedures: None   Consults: Neurology, psychiatry  Subjective: Patient in bed, just woke up, denies any headache or chest pain.   Objective: Vitals: Blood pressure 102/60, pulse 90, temperature 98.1 F (36.7 C), temperature source Oral, resp. rate 18, SpO2 98 %.   Exam:  Slightly sleepy but easily arousable, confused at baseline, no focal  deficits [place].AT,PERRAL Supple Neck, No JVD,   Symmetrical Chest wall movement, Good air movement bilaterally, CTAB RRR,No Gallops, Rubs or new Murmurs,  +ve B.Sounds, Abd Soft, No tenderness,   No Cyanosis, Clubbing or edema    Assessment/Plan:  Metabolic encephalopathy in a patient with early onset advanced dementia - her acute metabolic encephalopathy has resolved now she is at her baseline unfortunately her baseline is pretty poor due to early onset advanced dementia, she has been seen by neurology and psychiatry.  Her MRI brain was positive for significant cerebral and cerebellar atrophy, continue supportive care.  Will require placement.      Dementia:  Significant atrophy on neuroimaging-chronic finding-neurology suspects patient has progressive dementia.  TSH/RPR stable-B12 being supplemented.  Await SNF bed , [name] cleared by psych for placement.  Vitamin B12 deficiency: Continue supplementation.  Bipolar disorder:  Appreciate psych input-continue Haldol .  Per psych requires SNF/memory care.  Elevated D-dimer  - CTA chest/lower extremity Dopplers negative for VTE.  COVID-19 infection:  Afebrile overnight-no clinical signs of pneumonia.  On room air.  Continue to monitor off treatment.  This problem has resolved.  Sinus tachycardia  Resolved-unclear etiology but thought to be due to dehydration.    Tobacco abuse: Continue transdermal nicotine  BMI: Estimated body mass index is 25.62 kg/m as calculated from the following:   Height as of an earlier encounter on 12/24/21: 5\' 6"  (1.676 m).   Weight as of an earlier encounter on 12/24/21: 72 kg.   Code status:   Code Status: Prior   DVT Prophylaxis: enoxaparin (LOVENOX) injection 40 mg Start: 12/26/21 1200    Family Communication: Mother-[phone]-updated at bedside on 01/28/2022   Disposition Plan: Status is: Inpt Planned Discharge Destination: Memory care/SNF-awaiting  bed.  Remains medically stable for  transfer.  Diet: Diet Order             Diet regular Room service appropriate? No; Fluid consistency: Thin  Diet effective now                    MEDICATIONS: Scheduled Meds:  atropine  1 drop Sublingual TID   vitamin B-12  1,000 mcg Oral Daily   docusate sodium  200 mg Oral BID   enoxaparin (LOVENOX) injection  40 mg Subcutaneous Q24H   feeding supplement (KATE FARMS STANDARD 1.4)  325 mL Oral BID BM   haloperidol  1 mg Oral BID   hydrocerin   Topical BID   metoprolol tartrate  25 mg Oral BID   nicotine  14 mg Transdermal Daily   polyethylene glycol  17 g Oral BID   scopolamine  1 patch Transdermal Q72H   Continuous Infusions: PRN Meds:.acetaminophen, alum & mag hydroxide-simeth, fluticasone, hydrOXYzine, LORazepam, magnesium hydroxide, metoprolol tartrate, OLANZapine zydis **AND** ziprasidone, ondansetron, mouth rinse   I have personally reviewed following labs and imaging studies  LABORATORY DATA:  Recent Labs  Lab 02/02/22 2344  WBC 5.7  HGB 13.9  HCT 41.0  PLT 310  MCV 88.4  MCH 30.0  MCHC 33.9  RDW 12.7    Recent Labs  Lab 02/02/22 2344  NA 140  K 3.9  CL 106  CO2 25  GLUCOSE 97  BUN 15  CREATININE 1.23*  CALCIUM 9.0  MG 2.1   Lab Results  Component Value Date   TSH 1.468 01/09/2022    RADIOLOGY STUDIES/RESULTS: No results found.   LOS: 39 days   Signature  Lala Lund M.D on 02/03/2022 at 9:53 AM   -  To page go to [email or web address]

## 2022-02-04 NOTE — Progress Notes (Signed)
PROGRESS NOTE        PATIENT DETAILS Name: Natalie Brown Age: 56 y.o. Sex: female Date of Birth: 05/27/1965 Admit Date: 12/24/2021 Admitting Physician Evalee Mutton Kristeen Mans, MD ZOX:[id number], [name], NP  Brief Summary: Patient is a 56 y.o.  female with history of bipolar disorder-who was hospitalized at [facility] received a Ativan challenge for possible catatonia-with minimal improvement-due to persistent altered mental status/tachycardia-patient was admitted to [facility] service for further evaluation and treatment.  No significant finding regarding her tachycardia work-up, 2D echo within normal limit, no significant electrolyte abnormalities.  Evaluated by psychiatry-and unfortunately-patient did not meet criteria to go back to [facility].  While awaiting appropriate disposition-small course complicated by acute [id number] infection.  Significant events: 9/4>> transferred to [facility]from [facility]-Altered-no improvement after Ativan challenge. 9/22>> febrile 9/23>> COVID-19 positive  Significant studies: 9/04>> vitamin B12: 290 9/04>> CXR: No obvious pneumonia 9/04>> MRI brain: No acute intracranial abnormality.  Advanced cerebral/cerebellar atrophy 9/04-9/5>> LTM EEG: No seizures. 9/05>> TSH: 1.3 9/5-9/6>> LTM EEG: Negative. 9/06>> CTA chest: No PE-no PNA. 9/07>> RPR: Nonreactive 9/10>> bilateral lower extremity Doppler: No DVT 9/19>> Echo: EF 60-65%. 9/23>> CXR: No PNA 9/25>> CT head: No acute intracranial process  Significant microbiology data: 9/23>> COVID PCR positive 9/23>> blood culture: Negative  Procedures: None   Consults: Neurology, psychiatry  Subjective: Patient in bed appears to be in no distress denies any headache chest pain or abdominal pain.   Objective: Vitals: Blood pressure 102/60, pulse 90, temperature 98.1 F (36.7 C), temperature source Oral, resp. rate 18, SpO2 98 %.   Exam:  Awake with no focal deficits, mildly confused,   [place].AT,PERRAL Supple Neck, No JVD,   Symmetrical Chest wall movement, Good air movement bilaterally, CTAB RRR,No Gallops, Rubs or new Murmurs,  +ve B.Sounds, Abd Soft, No tenderness,   No Cyanosis, Clubbing or edema     Assessment/Plan:  Metabolic encephalopathy in a patient with early onset advanced dementia - her acute metabolic encephalopathy has resolved now she is at her baseline unfortunately her baseline is pretty poor due to early onset advanced dementia, she has been seen by neurology and psychiatry.  Her MRI brain was positive for significant cerebral and cerebellar atrophy, continue supportive care.  Will require placement.      Dementia:  Significant atrophy on neuroimaging-chronic finding-neurology suspects patient has progressive dementia.  TSH/RPR stable-B12 being supplemented.  Await SNF bed , [name] cleared by psych for placement.  Vitamin B12 deficiency: Continue supplementation.  Bipolar disorder:  Appreciate psych input-continue Haldol .  Per psych requires SNF/memory care.  Elevated D-dimer  - CTA chest/lower extremity Dopplers negative for VTE.  COVID-19 infection:  Afebrile overnight-no clinical signs of pneumonia.  On room air.  Continue to monitor off treatment.  This problem has resolved.  Sinus tachycardia  Resolved-unclear etiology but thought to be due to dehydration.    Tobacco abuse: Continue transdermal nicotine  BMI: Estimated body mass index is 25.62 kg/m as calculated from the following:   Height as of this encounter: 5\' 6"  (1.676 m).   Weight as of an earlier encounter on 12/24/21: 72 kg.   Code status:   Code Status: Prior   DVT Prophylaxis: enoxaparin (LOVENOX) injection 40 mg Start: 12/26/21 1200    Family Communication: Mother-[phone]-updated at bedside on 01/28/2022   Disposition Plan: Status is: Inpt Planned Discharge Destination: Memory care/SNF-awaiting  bed.  Remains medically stable for transfer.  Diet: Diet Order              Diet regular Room service appropriate? No; Fluid consistency: Thin  Diet effective now                    MEDICATIONS: Scheduled Meds:  atropine  1 drop Sublingual TID   vitamin B-12  1,000 mcg Oral Daily   docusate sodium  200 mg Oral BID   enoxaparin (LOVENOX) injection  40 mg Subcutaneous Q24H   feeding supplement (KATE FARMS STANDARD 1.4)  325 mL Oral BID BM   haloperidol  1 mg Oral BID   hydrocerin   Topical BID   metoprolol tartrate  25 mg Oral BID   nicotine  14 mg Transdermal Daily   polyethylene glycol  17 g Oral BID   scopolamine  1 patch Transdermal Q72H   Continuous Infusions: PRN Meds:.acetaminophen, alum & mag hydroxide-simeth, fluticasone, hydrOXYzine, LORazepam, magnesium hydroxide, metoprolol tartrate, OLANZapine zydis **AND** ziprasidone, ondansetron, mouth rinse   I have personally reviewed following labs and imaging studies  LABORATORY DATA:  Recent Labs  Lab 02/02/22 2344  WBC 5.7  HGB 13.9  HCT 41.0  PLT 310  MCV 88.4  MCH 30.0  MCHC 33.9  RDW 12.7    Recent Labs  Lab 02/02/22 2344  NA 140  K 3.9  CL 106  CO2 25  GLUCOSE 97  BUN 15  CREATININE 1.23*  CALCIUM 9.0  MG 2.1   Lab Results  Component Value Date   TSH 1.468 01/09/2022    RADIOLOGY STUDIES/RESULTS: No results found.   LOS: 40 days   Signature  Susa Raring M.D on 02/04/2022 at 9:35 AM   -  To page go to [email or web address]

## 2022-02-04 NOTE — Progress Notes (Signed)
Mobility Specialist Progress Note:   02/04/22 1552  Mobility  Activity Ambulated with assistance in hallway  Level of Assistance Contact guard assist, steadying assist  Assistive Device Other (Comment) (HHA)  Distance Ambulated (ft) 750 ft  Activity Response Tolerated well  Mobility Referral Yes  $Mobility charge 1 Mobility   Pt received in bed and agreeable. Asymptomatic throughout session. Pt left in bed with all needs met, call bell in reach, and RN in room.   Kahil Agner Mobility Specialist-Acute Rehab Secure Chat only

## 2022-02-04 NOTE — TOC Progression Note (Signed)
Transition of Care [facility]) - Progression Note    Patient Details  Name: Natalie Brown MRN: [id number] Date of Birth: 06-Mar-1966  Transition of Care [facility]) CM/SW [place], LCSW Phone Number: 02/04/2022, 4:57 PM  Clinical Narrative:    [facility] does not have any beds available.   CSW provided update to patient's mother.      Barriers to Discharge: Financial Resources, Inadequate or no insurance, Homeless with medical needs  Expected Discharge Plan and Services   In-house Referral: Clinical Social Work   Post Acute Care Choice: Nursing Home Living arrangements for the past 2 months: Homeless [facility]) Expected Discharge Date: 12/24/21                                     Social Determinants of Health (SDOH) Interventions    Readmission Risk Interventions     No data to display

## 2022-02-05 NOTE — TOC Progression Note (Signed)
Transition of Care [facility]) - Progression Note    Patient Details  Name: Natalie Brown MRN: [id number] Date of Birth: 12/11/1965  Transition of Care [facility]) CM/SW [place], LCSW Phone Number: 02/05/2022, 12:28 PM  Clinical Narrative:    CSW continuing to search for safe disposition for patient.      Barriers to Discharge: Financial Resources, Inadequate or no insurance, Homeless with medical needs  Expected Discharge Plan and Services   In-house Referral: Clinical Social Work   Post Acute Care Choice: Nursing Home Living arrangements for the past 2 months: Homeless [facility]) Expected Discharge Date: 12/24/21                                     Social Determinants of Health (SDOH) Interventions    Readmission Risk Interventions     No data to display

## 2022-02-05 NOTE — Progress Notes (Signed)
PROGRESS NOTE        PATIENT DETAILS Name: Natalie Brown Age: 56 y.o. Sex: female Date of Birth: [birth year] Admit Date: 12/24/2021 Admitting Physician Evalee Mutton Kristeen Mans, MD [id number], [name], NP  Brief Summary: Patient is a 56 y.o.  female with history of bipolar disorder-who was hospitalized at [facility] received a Ativan challenge for possible catatonia-with minimal improvement-due to persistent altered mental status/tachycardia-patient was admitted to [facility] service for further evaluation and treatment.  No significant finding regarding her tachycardia work-up, 2D echo within normal limit, no significant electrolyte abnormalities.  Evaluated by psychiatry-and unfortunately-patient did not meet criteria to go back to [facility].  While awaiting appropriate disposition-small course complicated by acute YIRSW-54 infection.  Significant events: 9/4>> transferred to [facility]-from [facility]-Altered-no improvement after Ativan challenge. 9/22>> febrile 9/23>> COVID-19 positive  Significant studies: 9/04>> vitamin B12: 290 9/04>> CXR: No obvious pneumonia 9/04>> MRI brain: No acute intracranial abnormality.  Advanced cerebral/cerebellar atrophy 9/04-9/5>> LTM EEG: No seizures. 9/05>> TSH: 1.3 9/5-9/6>> LTM EEG: Negative. 9/06>> CTA chest: No PE-no PNA. 9/07>> RPR: Nonreactive 9/10>> bilateral lower extremity Doppler: No DVT 9/19>> Echo: EF 60-65%. 9/23>> CXR: No PNA 9/25>> CT head: No acute intracranial process  Significant microbiology data: 9/23>> COVID PCR positive 9/23>> blood culture: Negative  Procedures: None   Consults: Neurology, psychiatry  Subjective: Patient in bed appears to be in no distress eating breakfast, denies any headache chest or abdominal pain.   Objective: Vitals: Blood pressure 102/60, pulse 90, temperature 98.1 F (36.7 C), temperature source Oral, resp. rate 18, SpO2 98 %.   Exam:  Awake with no focal deficits, mildly confused,   [place].AT,PERRAL Supple Neck, No JVD,   Symmetrical Chest wall movement, Good air movement bilaterally, CTAB RRR,No Gallops, Rubs or new Murmurs,  +ve B.Sounds, Abd Soft, No tenderness,   No Cyanosis, Clubbing or edema   Assessment/Plan:  Metabolic encephalopathy in a patient with early onset advanced dementia - her acute metabolic encephalopathy has resolved now she is at her baseline unfortunately her baseline is pretty poor due to early onset advanced dementia, she has been seen by neurology and psychiatry.  Her MRI brain was positive for significant cerebral and cerebellar atrophy, continue supportive care.  Will require placement.      Dementia:  Significant atrophy on neuroimaging-chronic finding-neurology suspects patient has progressive dementia.  TSH/RPR stable-B12 being supplemented.  Await SNF bed , [name] cleared by psych for placement.  Vitamin B12 deficiency: Continue supplementation.  Bipolar disorder:  Appreciate psych input-continue Haldol .  Per psych requires SNF/memory care.  Elevated D-dimer  - CTA chest/lower extremity Dopplers negative for VTE.  COVID-19 infection:  Afebrile overnight-no clinical signs of pneumonia.  On room air.  Continue to monitor off treatment.  This problem has resolved.  Sinus tachycardia  Resolved-unclear etiology but thought to be due to dehydration.    Tobacco abuse: Continue transdermal nicotine  BMI: Estimated body mass index is 25.62 kg/m as calculated from the following:   Height as of this encounter: 5\' 6"  (1.676 m).   Weight as of an earlier encounter on 12/24/21: 72 kg.   Code status:   Code Status: Prior   DVT Prophylaxis: enoxaparin (LOVENOX) injection 40 mg Start: 12/26/21 1200    Family Communication: Mother-[phone] at bedside on 01/28/2022   Disposition Plan: Status is: Inpt Planned Discharge Destination: Memory care/SNF-awaiting bed.  Remains medically stable for transfer.  Diet: Diet Order              Diet regular Room service appropriate? No; Fluid consistency: Thin  Diet effective now                    MEDICATIONS: Scheduled Meds:  atropine  1 drop Sublingual TID   vitamin B-12  1,000 mcg Oral Daily   docusate sodium  200 mg Oral BID   enoxaparin (LOVENOX) injection  40 mg Subcutaneous Q24H   feeding supplement (KATE FARMS STANDARD 1.4)  325 mL Oral BID BM   haloperidol  1 mg Oral BID   hydrocerin   Topical BID   metoprolol tartrate  25 mg Oral BID   nicotine  14 mg Transdermal Daily   polyethylene glycol  17 g Oral BID   scopolamine  1 patch Transdermal Q72H   Continuous Infusions: PRN Meds:.acetaminophen, alum & mag hydroxide-simeth, fluticasone, hydrOXYzine, LORazepam, magnesium hydroxide, metoprolol tartrate, OLANZapine zydis **AND** ziprasidone, ondansetron, mouth rinse   I have personally reviewed following labs and imaging studies  LABORATORY DATA:  Recent Labs  Lab 02/02/22 2344  WBC 5.7  HGB 13.9  HCT 41.0  PLT 310  MCV 88.4  MCH 30.0  MCHC 33.9  RDW 12.7    Recent Labs  Lab 02/02/22 2344  NA 140  K 3.9  CL 106  CO2 25  GLUCOSE 97  BUN 15  CREATININE 1.23*  CALCIUM 9.0  MG 2.1   Lab Results  Component Value Date   TSH 1.468 01/09/2022    RADIOLOGY STUDIES/RESULTS: No results found.   LOS: 41 days   Signature  Lala Lund M.D on 02/05/2022 at 9:34 AM   -  To page go to [email or web address]

## 2022-02-05 NOTE — TOC Progression Note (Signed)
Transition of Care [facility]) - Progression Note    Patient Details  Name: Natalie Brown MRN: [id number] Date of Birth: 1966/04/14  Transition of Care [facility]) CM/SW [place], LCSW Phone Number: 02/05/2022, 12:22 PM  Clinical Narrative:    CSW continuing to search for safe disposition for patient.      Barriers to Discharge: Financial Resources, Inadequate or no insurance, Homeless with medical needs  Expected Discharge Plan and Services   In-house Referral: Clinical Social Work   Post Acute Care Choice: Nursing Home Living arrangements for the past 2 months: Homeless [facility]) Expected Discharge Date: 12/24/21                                     Social Determinants of Health (SDOH) Interventions    Readmission Risk Interventions     No data to display

## 2022-02-05 NOTE — TOC Progression Note (Signed)
Transition of Care [facility]) - Progression Note    Patient Details  Name: Natalie Brown MRN: [id number] Date of Birth: [birth year]  Transition of Care [facility]) CM/SW [place], [place] Work Phone Number: 02/05/2022, 4:27 PM  Clinical Narrative:    MSW intern contacted a few facilities to see if there were any available beds. [facility]: Left VM [facility]: Left VM [facility]: Does not accept Medicaid [facility]: Facility is private pay only [facility]: Spoke with admissions coordinator who stated the facility has one Medicaid female bed available on the memory care unit. MSW intern faxed over the referral.       Barriers to Discharge: Financial Resources, Inadequate or no insurance, Homeless with medical needs  Expected Discharge Plan and Services   In-house Referral: Clinical Social Work   Post Acute Care Choice: Nursing Home Living arrangements for the past 2 months: Homeless [facility]) Expected Discharge Date: 12/24/21                                     Social Determinants of Health (SDOH) Interventions    Readmission Risk Interventions     No data to display

## 2022-02-05 NOTE — TOC Progression Note (Signed)
Transition of Care [facility]) - Progression Note    Patient Details  Name: Natalie Brown MRN: [id number] Date of Birth: 1966/04/05  Transition of Care [facility]) CM/SW [place], LCSW Phone Number: 02/05/2022, 12:26 PM  Clinical Narrative:    CSW continuing to search for safe disposition for patient.      Barriers to Discharge: Financial Resources, Inadequate or no insurance, Homeless with medical needs  Expected Discharge Plan and Services   In-house Referral: Clinical Social Work   Post Acute Care Choice: Nursing Home Living arrangements for the past 2 months: Homeless [facility]) Expected Discharge Date: 12/24/21                                     Social Determinants of Health (SDOH) Interventions    Readmission Risk Interventions     No data to display

## 2022-02-05 NOTE — TOC Progression Note (Signed)
Transition of Care (TOC) - Progression Note    Patient Details  Name: Natalie Brown MRN: [id number] Date of Birth: 01/13/1966  Transition of Care (TOC) CM/SW Contact  Michael Walrath S Tavari [place]holt, LCSW Phone Number: 02/05/2022, 12:25 PM  Clinical Narrative:    CSW continuing to search for safe disposition for patient.      Barriers to Discharge: Financial Resources, Inadequate or no insurance, Homeless with medical needs  Expected Discharge Plan and Services   In-house Referral: Clinical Social Work   Post Acute Care Choice: Nursing Home Living arrangements for the past 2 months: Homeless ([facility]) Expected Discharge Date: 12/24/21                                     Social Determinants of Health (SDOH) Interventions    Readmission Risk Interventions     No data to display          

## 2022-02-05 NOTE — Plan of Care (Signed)

## 2022-02-05 NOTE — Progress Notes (Signed)
Mobility Specialist Progress Note:   02/05/22 1105  Mobility  Activity Ambulated with assistance to bathroom  Level of Assistance Standby assist, set-up cues, supervision of patient - no hands on  Assistive Device None  Distance Ambulated (ft) 30 ft  Activity Response Tolerated well  Mobility Referral Yes  $Mobility charge 1 Mobility   Responded to bed alarm. Pt requesting to go to BR. Required no physical assistance. Pt back in bed with all needs met, bed alarm on.  Nelta Numbers Acute Rehab Secure Chat or Office Phone: [phone]

## 2022-02-05 NOTE — Plan of Care (Signed)
  Problem: Safety: Goal: Non-violent Restraint(s) Outcome: Progressing Variances Communication barriers Impact: Moderate Did not return to baseline function Impact: High Patient Uncooperative Impact: Moderate

## 2022-02-05 NOTE — Progress Notes (Signed)
Mobility Specialist Progress Note:   02/05/22 0900  Mobility  Activity Ambulated with assistance in hallway  Level of Assistance Contact guard assist, steadying assist  Assistive Device Other (Comment) (HHA)  Distance Ambulated (ft) 500 ft  Activity Response Tolerated well  Mobility Referral Yes  $Mobility charge 1 Mobility   Pt eager for mobility session. Required HHA throughout for comfort. Pt back in bed with all needs met.   Nelta Numbers Acute Rehab Secure Chat or Office Phone: [phone]

## 2022-02-05 NOTE — TOC Progression Note (Signed)
Transition of Care [facility]) - Progression Note    Patient Details  Name: Natalie Brown MRN: [id number] Date of Birth: [birth year]  Transition of Care [facility]) CM/SW [place], LCSW Phone Number: 02/05/2022, 12:25 PM  Clinical Narrative:    CSW continuing to search for safe disposition for patient.      Barriers to Discharge: Financial Resources, Inadequate or no insurance, Homeless with medical needs  Expected Discharge Plan and Services   In-house Referral: Clinical Social Work   Post Acute Care Choice: Nursing Home Living arrangements for the past 2 months: Homeless [facility]) Expected Discharge Date: 12/24/21                                     Social Determinants of Health (SDOH) Interventions    Readmission Risk Interventions     No data to display

## 2022-02-06 NOTE — TOC Progression Note (Signed)
Transition of Care [facility]) - Progression Note    Patient Details  Name: Natalie Brown MRN: [id number] Date of Birth: 1966/02/04  Transition of Care [facility]) CM/SW [place], [place] Work Phone Number: 02/06/2022, 4:28 PM  Clinical Narrative:    MSW intern reached out to [facility] and was provided an email to scan the referral ([email or web address]). MSW intern also spoke with [facility] and left a VM regarding the referral that was faxed over yesterday. MSW intern left another voicemail for [employer]. [facility] responded and stated they do not accept Medicaid. MSW intern is awating responses from a few other facilities as well.     Barriers to Discharge: Financial Resources, Inadequate or no insurance, Homeless with medical needs  Expected Discharge Plan and Services   In-house Referral: Clinical Social Work   Post Acute Care Choice: Nursing Home Living arrangements for the past 2 months: Homeless [facility]) Expected Discharge Date: 12/24/21                                     Social Determinants of Health (SDOH) Interventions    Readmission Risk Interventions     No data to display

## 2022-02-06 NOTE — Progress Notes (Signed)
Mobility Specialist Progress Note:   02/06/22 1030  Mobility  Activity Ambulated with assistance to bathroom  Level of Assistance Standby assist, set-up cues, supervision of patient - no hands on  Assistive Device None  Distance Ambulated (ft) 30 ft  Activity Response Tolerated well  Mobility Referral Yes  $Mobility charge 1 Mobility   Pt requesting to go to BR. Required no physical assistance throughout. Back in bed with all needs met.   Nelta Numbers Acute Rehab Secure Chat or Office Phone: [phone]

## 2022-02-06 NOTE — Progress Notes (Signed)
Mobility Specialist Progress Note:   02/06/22 1412  Mobility  Activity Ambulated with assistance in hallway  Level of Assistance Contact guard assist, steadying assist  Assistive Device Other (Comment) (HHA)  Distance Ambulated (ft) 500 ft  Activity Response Tolerated well  Mobility Referral Yes  $Mobility charge 1 Mobility   Pt received in bed and requesting assistance to BR. Pt independent with pericare. Pt requested Hallway ambulation after BR. Asymptomatic throughout. Pt left in bed with all needs met, call bell in reach, and RN in room.   Natalie Brown Mobility Specialist-Acute Rehab Secure Chat only

## 2022-02-06 NOTE — Progress Notes (Signed)
PROGRESS NOTE        PATIENT DETAILS Name: Natalie Brown Age: 56 y.o. Sex: female Date of Birth: 1965/09/05 Admit Date: 12/24/2021 Admitting Physician Dewayne Shorter Levora Dredge, MD [id number], [name], NP  Brief Summary: Patient is a 56 y.o.  female with history of bipolar disorder-who was hospitalized at [facility] received a Ativan challenge for possible catatonia-with minimal improvement-due to persistent altered mental status/tachycardia-patient was admitted to [facility] service for further evaluation and treatment.  No significant finding regarding her tachycardia work-up, 2D echo within normal limit, no significant electrolyte abnormalities.  Evaluated by psychiatry-and unfortunately-patient did not meet criteria to go back to [facility].  While awaiting appropriate disposition-small course complicated by acute COVID-19 infection.  Significant events: 9/4>> transferred [facility]-from [facility]-Altered-no improvement after Ativan challenge. 9/22>> febrile 9/23>> COVID-19 positive  Significant studies: 9/04>> vitamin B12: 290 9/04>> CXR: No obvious pneumonia 9/04>> MRI brain: No acute intracranial abnormality.  Advanced cerebral/cerebellar atrophy 9/04-9/5>> LTM EEG: No seizures. 9/05>> TSH: 1.3 9/5-9/6>> LTM EEG: Negative. 9/06>> CTA chest: No PE-no PNA. 9/07>> RPR: Nonreactive 9/10>> bilateral lower extremity Doppler: No DVT 9/19>> Echo: EF 60-65%. 9/23>> CXR: No PNA 9/25>> CT head: No acute intracranial process  Significant microbiology data: 9/23>> COVID PCR positive 9/23>> blood culture: Negative  Procedures: None   Consults: Neurology, psychiatry  Subjective:  No significant events overnight as discussed with staff, she is unable to provide any complaints today   Objective: Vitals: Blood pressure 102/60, pulse 90, temperature 98.1 F (36.7 C), temperature source Oral, resp. rate 18, SpO2 98 %.   Exam:  [name] awake, eyes open, avoiding eye contact, does not  answer any questions, good air entry bilaterally, regular rate and rhythm, extremities with no edema.  Assessment/Plan:  Metabolic encephalopathy in a patient with early onset advanced dementia - her acute metabolic encephalopathy has resolved now she is at her baseline unfortunately her baseline is pretty poor due to early onset advanced dementia, she has been seen by neurology and psychiatry.  Her MRI brain was positive for significant cerebral and cerebellar atrophy, continue supportive care.  Will require placement.      Dementia:  Significant atrophy on neuroimaging-chronic finding-neurology suspects patient has progressive dementia.  TSH/RPR stable-B12 being supplemented.  Await SNF bed , [name] cleared by psych for placement.  Vitamin B12 deficiency: Continue supplementation.  Bipolar disorder:  Appreciate psych input-continue Haldol .  Per psych requires SNF/memory care.  Elevated D-dimer  - CTA chest/lower extremity Dopplers negative for VTE.  COVID-19 infection:  Afebrile overnight-no clinical signs of pneumonia.  On room air.  Continue to monitor off treatment.  This problem has resolved.  Sinus tachycardia  Resolved-unclear etiology but thought to be due to dehydration.    Tobacco abuse: Continue transdermal nicotine  BMI: Estimated body mass index is 25.62 kg/m as calculated from the following:   Height as of this encounter: 5\' 6"  (1.676 m).   Weight as of an earlier encounter on 12/24/21: 72 kg.   Code status:   Code Status: Prior   DVT Prophylaxis: enoxaparin (LOVENOX) injection 40 mg Start: 12/26/21 1200    Family Communication: [phone]-updated at bedside on 01/28/2022   Disposition Plan: Status is: Inpt Planned Discharge Destination: Memory care/SNF-awaiting bed.  Remains medically stable for transfer.  Diet: Diet Order  Diet regular Room service appropriate? No; Fluid consistency: Thin  Diet effective now                     MEDICATIONS: Scheduled Meds:  atropine  1 drop Sublingual TID   vitamin B-12  1,000 mcg Oral Daily   docusate sodium  200 mg Oral BID   enoxaparin (LOVENOX) injection  40 mg Subcutaneous Q24H   feeding supplement (KATE FARMS STANDARD 1.4)  325 mL Oral BID BM   haloperidol  1 mg Oral BID   hydrocerin   Topical BID   metoprolol tartrate  25 mg Oral BID   nicotine  14 mg Transdermal Daily   polyethylene glycol  17 g Oral BID   scopolamine  1 patch Transdermal Q72H   Continuous Infusions: PRN Meds:.acetaminophen, alum & mag hydroxide-simeth, fluticasone, hydrOXYzine, LORazepam, magnesium hydroxide, metoprolol tartrate, OLANZapine zydis **AND** ziprasidone, ondansetron, mouth rinse   I have personally reviewed following labs and imaging studies  LABORATORY DATA:  Recent Labs  Lab 02/02/22 2344  WBC 5.7  HGB 13.9  HCT 41.0  PLT 310  MCV 88.4  MCH 30.0  MCHC 33.9  RDW 12.7    Recent Labs  Lab 02/02/22 2344  NA 140  K 3.9  CL 106  CO2 25  GLUCOSE 97  BUN 15  CREATININE 1.23*  CALCIUM 9.0  MG 2.1   Lab Results  Component Value Date   TSH 1.468 01/09/2022    RADIOLOGY STUDIES/RESULTS: No results found.   LOS: 42 days   Signature  Phillips Climes M.D on 02/06/2022 at 1:23 PM   -  To page go to [email or web address]

## 2022-02-06 NOTE — Plan of Care (Signed)
  Problem: Safety: Goal: Non-violent Restraint(s) Outcome: Progressing   

## 2022-02-06 NOTE — Progress Notes (Signed)
Mobility Specialist Progress Note:   02/06/22 0900  Mobility  Activity Ambulated with assistance in hallway  Level of Assistance Contact guard assist, steadying assist  Assistive Device Other (Comment) (HHA)  Distance Ambulated (ft) 500 ft  Activity Response Tolerated well  Mobility Referral Yes  $Mobility charge 1 Mobility   Pt eager for mobility session. HHA given for pt comfort. Pt back in bed with all needs met, posey belt on. Bed alarm on.  Nelta Numbers Acute Rehab Secure Chat or Office Phone: [phone]

## 2022-02-07 DIAGNOSIS — F03918 Unspecified dementia, unspecified severity, with other behavioral disturbance: Secondary | ICD-10-CM

## 2022-02-07 NOTE — Progress Notes (Signed)
Mobility Specialist Progress Note:   02/07/22 0945  Mobility  Activity Ambulated with assistance in hallway  Level of Assistance Contact guard assist, steadying assist  Assistive Device Other (Comment) (HHA)  Distance Ambulated (ft) 500 ft  Activity Response Tolerated well  Mobility Referral Yes  $Mobility charge 1 Mobility   Pt eager for mobility session. Required HHA for comfort. Pt back in bed with all needs met. Bed alarm on.   Nelta Numbers Acute Rehab Secure Chat or Office Phone: [phone]

## 2022-02-07 NOTE — TOC Progression Note (Signed)
Transition of Care [facility]) - Progression Note    Patient Details  Name: Natalie Brown MRN: [id number] Date of Birth: 1965/12/23  Transition of Care [facility]) CM/SW [place], LCSW Phone Number: 02/07/2022, 9:20 AM  Clinical Narrative:    [facility] responded and stated they do not have any memory care beds.  [facility] requested more information, which CSW provided.      Barriers to Discharge: Financial Resources, Inadequate or no insurance, Homeless with medical needs  Expected Discharge Plan and Services   In-house Referral: Clinical Social Work   Post Acute Care Choice: Nursing Home Living arrangements for the past 2 months: Homeless [facility]) Expected Discharge Date: 12/24/21                                     Social Determinants of Health (SDOH) Interventions    Readmission Risk Interventions     No data to display

## 2022-02-07 NOTE — Progress Notes (Signed)
PROGRESS NOTE        PATIENT DETAILS Name: Natalie Brown Age: 56 y.o. Sex: female Date of Birth: 03-Oct-1965 Admit Date: 12/24/2021 Admitting Physician Evalee Mutton Kristeen Mans, MD [id number], [name], NP  Brief Summary: Patient is a 56 y.o.  female with history of bipolar disorder-who was hospitalized at [facility] received a Ativan challenge for possible catatonia-with minimal improvement-due to persistent altered mental status/tachycardia-patient was admitted to [facility] service for further evaluation and treatment.  No significant finding regarding her tachycardia work-up, 2D echo within normal limit, no significant electrolyte abnormalities.  Evaluated by psychiatry-and unfortunately-patient did not meet criteria to go back to [facility].  While awaiting appropriate disposition-small course complicated by acute OHYWV-37 infection.  Significant events: 9/4>> transferred to [facility]-from [facility]-Altered-no improvement after Ativan challenge. 9/22>> febrile 9/23>> COVID-19 positive  Significant studies: 9/04>> vitamin B12: 290 9/04>> CXR: No obvious pneumonia 9/04>> MRI brain: No acute intracranial abnormality.  Advanced cerebral/cerebellar atrophy 9/04-9/5>> LTM EEG: No seizures. 9/05>> TSH: 1.3 9/5-9/6>> LTM EEG: Negative. 9/06>> CTA chest: No PE-no PNA. 9/07>> RPR: Nonreactive 9/10>> bilateral lower extremity Doppler: No DVT 9/19>> Echo: EF 60-65%. 9/23>> CXR: No PNA 9/25>> CT head: No acute intracranial process  Significant microbiology data: 9/23>> COVID PCR positive 9/23>> blood culture: Negative  Procedures: None   Consults: Neurology, psychiatry  Subjective:   No significant events overnight as discussed with staff, patient asking when she can go back to behavioral health   Objective: Vitals: Blood pressure 102/60, pulse 90, temperature 98.1 F (36.7 C), temperature source Oral, resp. rate 18, SpO2 98 %.   Exam:  Patient is awake, good air entry  bilaterally, flat affect, regular rate and rhythm .    Assessment/Plan:  Metabolic encephalopathy in a patient with early onset advanced dementia - her acute metabolic encephalopathy has resolved now she is at her baseline unfortunately her baseline is pretty poor due to early onset advanced dementia, she has been seen by neurology and psychiatry.  Her MRI brain was positive for significant cerebral and cerebellar atrophy, continue supportive care.  Will require placement.      Dementia:  Significant atrophy on neuroimaging-chronic finding-neurology suspects patient has progressive dementia.  TSH/RPR stable-B12 being supplemented.  Await SNF bed , [name] cleared by psych for placement.  Vitamin B12 deficiency: Continue supplementation.  Bipolar disorder:  Appreciate psych input-continue Haldol .  Per psych requires SNF/memory care.  Elevated D-dimer  - CTA chest/lower extremity Dopplers negative for VTE.  COVID-19 infection:  Afebrile overnight-no clinical signs of pneumonia.  On room air.  Continue to monitor off treatment.  This problem has resolved.  Sinus tachycardia  Resolved-unclear etiology but thought to be due to dehydration.    Tobacco abuse: Continue transdermal nicotine  BMI: Estimated body mass index is 25.62 kg/m as calculated from the following:   Height as of this encounter: 5\' 6"  (1.676 m).   Weight as of an earlier encounter on 12/24/21: 72 kg.   Code status:   Code Status: Prior   DVT Prophylaxis: enoxaparin (LOVENOX) injection 40 mg Start: 12/26/21 1200    Family Communication: Mother-[phone]-updated at bedside on 01/28/2022   Disposition Plan: Status is: Inpt Planned Discharge Destination: Memory care/SNF-awaiting bed.  Remains medically stable for transfer.  Diet: Diet Order             Diet regular Room service appropriate?  No; Fluid consistency: Thin  Diet effective now                    MEDICATIONS: Scheduled Meds:  atropine  1  drop Sublingual TID   vitamin B-12  1,000 mcg Oral Daily   docusate sodium  200 mg Oral BID   enoxaparin (LOVENOX) injection  40 mg Subcutaneous Q24H   feeding supplement (KATE FARMS STANDARD 1.4)  325 mL Oral BID BM   haloperidol  1 mg Oral BID   hydrocerin   Topical BID   metoprolol tartrate  25 mg Oral BID   nicotine  14 mg Transdermal Daily   polyethylene glycol  17 g Oral BID   scopolamine  1 patch Transdermal Q72H   Continuous Infusions: PRN Meds:.acetaminophen, alum & mag hydroxide-simeth, fluticasone, hydrOXYzine, LORazepam, magnesium hydroxide, metoprolol tartrate, OLANZapine zydis **AND** ziprasidone, ondansetron, mouth rinse   I have personally reviewed following labs and imaging studies  LABORATORY DATA:  Recent Labs  Lab 02/02/22 2344  WBC 5.7  HGB 13.9  HCT 41.0  PLT 310  MCV 88.4  MCH 30.0  MCHC 33.9  RDW 12.7    Recent Labs  Lab 02/02/22 2344  NA 140  K 3.9  CL 106  CO2 25  GLUCOSE 97  BUN 15  CREATININE 1.23*  CALCIUM 9.0  MG 2.1   Lab Results  Component Value Date   TSH 1.468 01/09/2022    RADIOLOGY STUDIES/RESULTS: No results found.   LOS: 43 days   Signature  Huey Bienenstock M.D on 02/07/2022 at 2:00 PM   -  To page go to [email or web address]

## 2022-02-08 MED ORDER — TUBERCULIN PPD 5 UNIT/0.1ML ID SOLN
5.0000 [IU] | Freq: Once | INTRADERMAL | Status: AC
  Administered 2022-02-08: 5 [IU] via INTRADERMAL
  Filled 2022-02-08: qty 0.1

## 2022-02-08 MED ORDER — NICOTINE 7 MG/24HR TD PT24
7.0000 mg | MEDICATED_PATCH | Freq: Every day | TRANSDERMAL | Status: DC
  Administered 2022-02-09 – 2022-02-19 (×10): 7 mg via TRANSDERMAL
  Filled 2022-02-08 (×10): qty 1

## 2022-02-08 NOTE — TOC Progression Note (Signed)
Transition of Care [facility]) - Progression Note    Patient Details  Name: Natalie Brown MRN: [id number] Date of Birth: 09/19/1965  Transition of Care [facility]) CM/SW [place], LCSW Phone Number: 02/08/2022, 4:43 PM  Clinical Narrative:    Followed up with [facility] but admissions is out of the building. Emailed follow up to [email or web address].      Barriers to Discharge: Financial Resources, Inadequate or no insurance, Homeless with medical needs  Expected Discharge Plan and Services   In-house Referral: Clinical Social Work   Post Acute Care Choice: Nursing Home Living arrangements for the past 2 months: Homeless [facility]) Expected Discharge Date: 12/24/21                                     Social Determinants of Health (SDOH) Interventions    Readmission Risk Interventions     No data to display

## 2022-02-08 NOTE — Progress Notes (Signed)
PROGRESS NOTE        PATIENT DETAILS Name: Natalie Brown Age: 56 y.o. Sex: female Date of Birth: 07-Jun-1965 Admit Date: 12/24/2021 Admitting Physician Evalee Mutton Kristeen Mans, MD ZOX:[id number], [name], NP  Brief Summary:  Patient is a 56 y.o.  female with history of bipolar disorder-who was hospitalized at [facility] received a Ativan challenge for possible catatonia-with minimal improvement-due to persistent altered mental status/tachycardia-patient was admitted to [facility] service for further evaluation and treatment.  No significant finding regarding her tachycardia work-up, 2D echo within normal limit, no significant electrolyte abnormalities.  Evaluated by psychiatry-and unfortunately-patient did not meet criteria to go back to [facility].  While awaiting appropriate disposition-small course complicated by acute WUJWJ-19 infection.  Significant events: 9/4>> transferred to [facility]-from [facility]-Altered-no improvement after Ativan challenge. 9/22>> febrile 9/23>> COVID-19 positive  Significant studies: 9/04>> vitamin B12: 290 9/04>> CXR: No obvious pneumonia 9/04>> MRI brain: No acute intracranial abnormality.  Advanced cerebral/cerebellar atrophy 9/04-9/5>> LTM EEG: No seizures. 9/05>> TSH: 1.3 9/5-9/6>> LTM EEG: Negative. 9/06>> CTA chest: No PE-no PNA. 9/07>> RPR: Nonreactive 9/10>> bilateral lower extremity Doppler: No DVT 9/19>> Echo: EF 60-65%. 9/23>> CXR: No PNA 9/25>> CT head: No acute intracranial process  Significant microbiology data: 9/23>> COVID PCR positive 9/23>> blood culture: Negative  Procedures: None   Consults: Neurology, psychiatry  Subjective:   No significant events overnight, patient denies any complaints  Objective: Vitals: Blood pressure 102/60, pulse 90, temperature 98.1 F (36.7 C), temperature source Oral, resp. rate 18, SpO2 98 %.   Exam:  HEENT is awake, minimally communicative, avoiding eye contact, sitting in the bed, good air  entry bilaterally.    Assessment/Plan:  Metabolic encephalopathy in a patient with early onset advanced dementia - her acute metabolic encephalopathy has resolved now she is at her baseline unfortunately her baseline is pretty poor due to early onset advanced dementia, she has been seen by neurology and psychiatry.  Her MRI brain was positive for significant cerebral and cerebellar atrophy, continue supportive care.  Will require placement.      Dementia:  Significant atrophy on neuroimaging-chronic finding-neurology suspects patient has progressive dementia.  TSH/RPR stable-B12 being supplemented.  Await SNF bed , [name] cleared by psych for placement.  Vitamin B12 deficiency: Continue supplementation.  Bipolar disorder:  Appreciate psych input-continue Haldol .  Per psych requires SNF/memory care.  Elevated D-dimer  - CTA chest/lower extremity Dopplers negative for VTE.  COVID-19 infection:  Afebrile overnight-no clinical signs of pneumonia.  On room air.  Continue to monitor off treatment.  This problem has resolved.  Sinus tachycardia  Resolved-unclear etiology but thought to be due to dehydration.    Tobacco abuse: Continue transdermal nicotine  Tuberculin test placed 10/20  BMI: Estimated body mass index is 25.62 kg/m as calculated from the following:   Height as of this encounter: 5\' 6"  (1.676 m).   Weight as of an earlier encounter on 12/24/21: 72 kg.   Code status:   Code Status: Prior   DVT Prophylaxis: enoxaparin (LOVENOX) injection 40 mg Start: 12/26/21 1200    Family Communication: None at bedside   Disposition Plan: Status is: Inpt Planned Discharge Destination: Memory care/SNF-awaiting bed.  Remains medically stable for transfer.  Diet: Diet Order             Diet regular Room service appropriate? No; Fluid consistency: Thin  Diet effective now                    MEDICATIONS: Scheduled Meds:  atropine  1 drop Sublingual TID   vitamin B-12  1,000  mcg Oral Daily   docusate sodium  200 mg Oral BID   enoxaparin (LOVENOX) injection  40 mg Subcutaneous Q24H   feeding supplement (KATE FARMS STANDARD 1.4)  325 mL Oral BID BM   haloperidol  1 mg Oral BID   hydrocerin   Topical BID   metoprolol tartrate  25 mg Oral BID   nicotine  14 mg Transdermal Daily   polyethylene glycol  17 g Oral BID   scopolamine  1 patch Transdermal Q72H   tuberculin  5 Units Intradermal Once   Continuous Infusions: PRN Meds:.acetaminophen, alum & mag hydroxide-simeth, fluticasone, hydrOXYzine, LORazepam, magnesium hydroxide, metoprolol tartrate, OLANZapine zydis **AND** ziprasidone, ondansetron, mouth rinse   I have personally reviewed following labs and imaging studies  LABORATORY DATA:  Recent Labs  Lab 02/02/22 2344  WBC 5.7  HGB 13.9  HCT 41.0  PLT 310  MCV 88.4  MCH 30.0  MCHC 33.9  RDW 12.7    Recent Labs  Lab 02/02/22 2344  NA 140  K 3.9  CL 106  CO2 25  GLUCOSE 97  BUN 15  CREATININE 1.23*  CALCIUM 9.0  MG 2.1   Lab Results  Component Value Date   TSH 1.468 01/09/2022    RADIOLOGY STUDIES/RESULTS: No results found.   LOS: 44 days   Signature  Huey Bienenstock M.D on 02/08/2022 at 3:22 PM   -  To page go to [email or web address]

## 2022-02-09 NOTE — Progress Notes (Signed)
Mobility Specialist Progress Note:   02/09/22 1145  Mobility  Activity Ambulated with assistance in hallway  Level of Assistance Contact guard assist, steadying assist  Assistive Device Other (Comment) (HHA)  Distance Ambulated (ft) 500 ft  Activity Response Tolerated well  Mobility Referral Yes  $Mobility charge 1 Mobility   Pt eager for mobility session. Required only CGA through HHA for comfort. Pt back in bed with all needs met. Bed alarm on.   Nelta Numbers Acute Rehab Secure Chat or Office Phone: [phone]

## 2022-02-09 NOTE — Progress Notes (Addendum)
PROGRESS NOTE        PATIENT DETAILS Name: Natalie Brown Age: 56 y.o. Sex: female Date of Birth: 1965-04-24 Admit Date: 12/24/2021 Admitting Physician Evalee Mutton Kristeen Mans, MD [id number], [name], NP  Brief Summary:  Patient is a 56 y.o.  female with history of bipolar disorder-who was hospitalized at [facility] received a Ativan challenge for possible catatonia-with minimal improvement-due to persistent altered mental status/tachycardia-patient was admitted to [facility] service for further evaluation and treatment.  No significant finding regarding her tachycardia work-up, 2D echo within normal limit, no significant electrolyte abnormalities.  Evaluated by psychiatry-and unfortunately-patient did not meet criteria to go back to [facility].  While awaiting appropriate disposition-small course complicated by acute IWPYK-99 infection.  Significant events: 9/4>> transferred to [facility]-from [facility]-Altered-no improvement after Ativan challenge. 9/22>> febrile 9/23>> COVID-19 positive  Significant studies: 9/04>> vitamin B12: 290 9/04>> CXR: No obvious pneumonia 9/04>> MRI brain: No acute intracranial abnormality.  Advanced cerebral/cerebellar atrophy 9/04-9/5>> LTM EEG: No seizures. 9/05>> TSH: 1.3 9/5-9/6>> LTM EEG: Negative. 9/06>> CTA chest: No PE-no PNA. 9/07>> RPR: Nonreactive 9/10>> bilateral lower extremity Doppler: No DVT 9/19>> Echo: EF 60-65%. 9/23>> CXR: No PNA 9/25>> CT head: No acute intracranial process  Significant microbiology data: 9/23>> COVID PCR positive 9/23>> blood culture: Negative  Procedures: None   Consults: Neurology, psychiatry  Subjective:   No significant events overnight, patient denies any complaints  Objective: Vitals: Blood pressure 102/60, pulse 90, temperature 98.1 F (36.7 C), temperature source Oral, resp. rate 18, SpO2 98 %.   Exam:  Awake, good air entry. RRR.  Assessment/Plan:     Tuberculin test placed  83/38  Metabolic encephalopathy in a patient with early onset advanced dementia - her acute metabolic encephalopathy has resolved now she is at her baseline unfortunately her baseline is pretty poor due to early onset advanced dementia, she has been seen by neurology and psychiatry.  Her MRI brain was positive for significant cerebral and cerebellar atrophy, continue supportive care.  Will require placement.      Dementia:  Significant atrophy on neuroimaging-chronic finding-neurology suspects patient has progressive dementia.  TSH/RPR stable-B12 being supplemented.  Await SNF bed , [name] cleared by psych for placement.  Vitamin B12 deficiency: Continue supplementation.  Bipolar disorder:  Appreciate psych input-continue Haldol .  Per psych requires SNF/memory care.  Elevated D-dimer  - CTA chest/lower extremity Dopplers negative for VTE.  COVID-19 infection:  Afebrile overnight-no clinical signs of pneumonia.  On room air.  Continue to monitor off treatment.  This problem has resolved.  Sinus tachycardia  Resolved-unclear etiology but thought to be due to dehydration.    Tobacco abuse: Continue transdermal nicotine    BMI: Estimated body mass index is 25.62 kg/m as calculated from the following:   Height as of this encounter: 5\' 6"  (1.676 m).   Weight as of an earlier encounter on 12/24/21: 72 kg.   Code status:   Code Status: Prior   DVT Prophylaxis: enoxaparin (LOVENOX) injection 40 mg Start: 12/26/21 1200    Family Communication: None at bedside   Disposition Plan: Status is: Inpt Planned Discharge Destination: Memory care/SNF-awaiting bed.  Remains medically stable for transfer.  Diet: Diet Order             Diet regular Room service appropriate? No; Fluid consistency: Thin  Diet effective now  MEDICATIONS: Scheduled Meds:  atropine  1 drop Sublingual TID   vitamin B-12  1,000 mcg Oral Daily   docusate sodium  200 mg Oral BID    enoxaparin (LOVENOX) injection  40 mg Subcutaneous Q24H   feeding supplement (KATE FARMS STANDARD 1.4)  325 mL Oral BID BM   haloperidol  1 mg Oral BID   hydrocerin   Topical BID   metoprolol tartrate  25 mg Oral BID   nicotine  7 mg Transdermal Daily   polyethylene glycol  17 g Oral BID   scopolamine  1 patch Transdermal Q72H   tuberculin  5 Units Intradermal Once   Continuous Infusions: PRN Meds:.acetaminophen, alum & mag hydroxide-simeth, fluticasone, hydrOXYzine, LORazepam, magnesium hydroxide, metoprolol tartrate, OLANZapine zydis **AND** ziprasidone, ondansetron, mouth rinse   I have personally reviewed following labs and imaging studies  LABORATORY DATA:  Recent Labs  Lab 02/02/22 2344  WBC 5.7  HGB 13.9  HCT 41.0  PLT 310  MCV 88.4  MCH 30.0  MCHC 33.9  RDW 12.7    Recent Labs  Lab 02/02/22 2344  NA 140  K 3.9  CL 106  CO2 25  GLUCOSE 97  BUN 15  CREATININE 1.23*  CALCIUM 9.0  MG 2.1   Lab Results  Component Value Date   TSH 1.468 01/09/2022    RADIOLOGY STUDIES/RESULTS: No results found.   LOS: 45 days   Signature  Huey Bienenstock M.D on 02/09/2022 at 2:09 PM   -  To page go to [email or web address]

## 2022-02-10 LAB — CBC
HCT: 39.7 % (ref 36.0–46.0)
Hemoglobin: 13 g/dL (ref 12.0–15.0)
MCH: 29.6 pg (ref 26.0–34.0)
MCHC: 32.7 g/dL (ref 30.0–36.0)
MCV: 90.4 fL (ref 80.0–100.0)
Platelets: 288 10*3/uL (ref 150–400)
RBC: 4.39 MIL/uL (ref 3.87–5.11)
RDW: 13 % (ref 11.5–15.5)
WBC: 4.9 10*3/uL (ref 4.0–10.5)
nRBC: 0 % (ref 0.0–0.2)

## 2022-02-10 LAB — BASIC METABOLIC PANEL
Anion gap: 9 (ref 5–15)
BUN: 14 mg/dL (ref 6–20)
CO2: 26 mmol/L (ref 22–32)
Calcium: 9.3 mg/dL (ref 8.9–10.3)
Chloride: 106 mmol/L (ref 98–111)
Creatinine, Ser: 1.19 mg/dL — ABNORMAL HIGH (ref 0.44–1.00)
GFR, Estimated: 54 mL/min — ABNORMAL LOW (ref >60)
Glucose, Bld: 89 mg/dL (ref 70–99)
Potassium: 3.7 mmol/L (ref 3.5–5.1)
Sodium: 141 mmol/L (ref 135–145)

## 2022-02-10 LAB — MAGNESIUM: Magnesium: 2.1 mg/dL (ref 1.7–2.4)

## 2022-02-10 NOTE — Progress Notes (Signed)
PROGRESS NOTE        PATIENT DETAILS Name: Natalie Brown Age: 56 y.o. Sex: female Date of Birth: 22-Mar-1966 Admit Date: 12/24/2021 Admitting Physician Dewayne Shorter Levora Dredge, MD [id number], [name], NP  Brief Summary:  Patient is a 56 y.o.  female with history of bipolar disorder-who was hospitalized at [facility] received a Ativan challenge for possible catatonia-with minimal improvement-due to persistent altered mental status/tachycardia-patient was admitted to [facility] service for further evaluation and treatment.  No significant finding regarding her tachycardia work-up, 2D echo within normal limit, no significant electrolyte abnormalities.  Evaluated by psychiatry-and unfortunately-patient did not meet criteria to go back to [facility].  While awaiting appropriate disposition-small course complicated by acute COVID-19 infection.  Significant events: 9/4>> transferred to [facility]-from [facility]-Altered-no improvement after Ativan challenge. 9/22>> febrile 9/23>> COVID-19 positive  Significant studies: 9/04>> vitamin B12: 290 9/04>> CXR: No obvious pneumonia 9/04>> MRI brain: No acute intracranial abnormality.  Advanced cerebral/cerebellar atrophy 9/04-9/5>> LTM EEG: No seizures. 9/05>> TSH: 1.3 9/5-9/6>> LTM EEG: Negative. 9/06>> CTA chest: No PE-no PNA. 9/07>> RPR: Nonreactive 9/10>> bilateral lower extremity Doppler: No DVT 9/19>> Echo: EF 60-65%. 9/23>> CXR: No PNA 9/25>> CT head: No acute intracranial process  Significant microbiology data: 9/23>> COVID PCR positive 9/23>> blood culture: Negative  Procedures: None   Consults: Neurology, psychiatry  Subjective:   No significant events overnight, patient denies any complaints  Objective: Vitals: Blood pressure 102/60, pulse 90, temperature 98.1 F (36.7 C), temperature source Oral, resp. rate 18, SpO2 98 %.   Exam: [name] awake, good air entry, regular rate and rhythm Assessment/Plan:     Tuberculin test  placed 10/20, eating is negative<5 mm in 48 hours  Metabolic encephalopathy in a patient with early onset advanced dementia - her acute metabolic encephalopathy has resolved now she is at her baseline unfortunately her baseline is pretty poor due to early onset advanced dementia, she has been seen by neurology and psychiatry.  Her MRI brain was positive for significant cerebral and cerebellar atrophy, continue supportive care.  Will require placement.      Dementia:  Significant atrophy on neuroimaging-chronic finding-neurology suspects patient has progressive dementia.  TSH/RPR stable-B12 being supplemented.  Await SNF bed , [name] cleared by psych for placement.  Vitamin B12 deficiency: Continue supplementation.  Bipolar disorder:  Appreciate psych input-continue Haldol .  Per psych requires SNF/memory care.  Elevated D-dimer  - CTA chest/lower extremity Dopplers negative for VTE.  COVID-19 infection:  Afebrile overnight-no clinical signs of pneumonia.  On room air.  Continue to monitor off treatment.  This problem has resolved.  Sinus tachycardia  Resolved-unclear etiology but thought to be due to dehydration.    Tobacco abuse: Continue transdermal nicotine    BMI: Estimated body mass index is 25.62 kg/m as calculated from the following:   Height as of this encounter: 5\' 6"  (1.676 m).   Weight as of an earlier encounter on 12/24/21: 72 kg.   Code status:   Code Status: Prior   DVT Prophylaxis: enoxaparin (LOVENOX) injection 40 mg Start: 12/26/21 1200    Family Communication: None at bedside   Disposition Plan: Status is: Inpt Planned Discharge Destination: Memory care/SNF-awaiting bed.  Remains medically stable for transfer.  Diet: Diet Order             Diet regular Room service appropriate? No; Fluid consistency:  Thin  Diet effective now                    MEDICATIONS: Scheduled Meds:  atropine  1 drop Sublingual TID   vitamin B-12  1,000 mcg Oral Daily    docusate sodium  200 mg Oral BID   enoxaparin (LOVENOX) injection  40 mg Subcutaneous Q24H   feeding supplement (KATE FARMS STANDARD 1.4)  325 mL Oral BID BM   haloperidol  1 mg Oral BID   hydrocerin   Topical BID   metoprolol tartrate  25 mg Oral BID   nicotine  7 mg Transdermal Daily   polyethylene glycol  17 g Oral BID   scopolamine  1 patch Transdermal Q72H   tuberculin  5 Units Intradermal Once   Continuous Infusions: PRN Meds:.acetaminophen, alum & mag hydroxide-simeth, fluticasone, hydrOXYzine, LORazepam, magnesium hydroxide, metoprolol tartrate, OLANZapine zydis **AND** ziprasidone, ondansetron, mouth rinse   I have personally reviewed following labs and imaging studies  LABORATORY DATA:  Recent Labs  Lab 02/10/22 0212  WBC 4.9  HGB 13.0  HCT 39.7  PLT 288  MCV 90.4  MCH 29.6  MCHC 32.7  RDW 13.0    Recent Labs  Lab 02/10/22 0212  NA 141  K 3.7  CL 106  CO2 26  GLUCOSE 89  BUN 14  CREATININE 1.19*  CALCIUM 9.3  MG 2.1   Lab Results  Component Value Date   TSH 1.468 01/09/2022    RADIOLOGY STUDIES/RESULTS: No results found.   LOS: 58 days   Signature  Phillips Climes M.D on 02/10/2022 at 1:43 PM   -  To page go to [email or web address]

## 2022-02-10 NOTE — Progress Notes (Signed)
Mobility Specialist Progress Note:   02/10/22 1100  Mobility  Activity Ambulated with assistance in hallway  Level of Assistance Contact guard assist, steadying assist  Assistive Device Other (Comment) (HHA)  Distance Ambulated (ft) 500 ft  Activity Response Tolerated well  Mobility Referral Yes  $Mobility charge 1 Mobility   Pt eager for mobility session. Required HHA for comfort. Pt back in bed with all needs met, posey belt on.  Natalie Brown Acute Rehab Secure Chat or Office Phone: [phone]

## 2022-02-11 NOTE — Plan of Care (Signed)
  Problem: Pain Managment: Goal: General experience of comfort will improve Outcome: Progressing   Problem: Safety: Goal: Ability to remain free from injury will improve Outcome: Progressing   Problem: Skin Integrity: Goal: Risk for impaired skin integrity will decrease Outcome: Progressing   

## 2022-02-11 NOTE — Progress Notes (Signed)
PROGRESS NOTE        PATIENT DETAILS Name: Natalie Brown Age: 56 y.o. Sex: female Date of Birth: 06-29-1965 Admit Date: 12/24/2021 Admitting Physician Evalee Mutton Kristeen Mans, MD [id number], [name], NP  Brief Summary:  Patient is a 56 y.o.  female with history of bipolar disorder-who was hospitalized at [facility] received a Ativan challenge for possible catatonia-with minimal improvement-due to persistent altered mental status/tachycardia-patient was admitted to [facility] service for further evaluation and treatment.  No significant finding regarding her tachycardia work-up, 2D echo within normal limit, no significant electrolyte abnormalities.  Evaluated by psychiatry-and unfortunately-patient did not meet criteria to go back to [facility].  While awaiting appropriate disposition-small course complicated by acute [id number] infection.  Significant events: 9/4>> transferred to [facility]-from [facility]-Altered-no improvement after Ativan challenge. 9/22>> febrile 9/23>> COVID-19 positive  Significant studies: 9/04>> vitamin B12: 290 9/04>> CXR: No obvious pneumonia 9/04>> MRI brain: No acute intracranial abnormality.  Advanced cerebral/cerebellar atrophy 9/04-9/5>> LTM EEG: No seizures. 9/05>> TSH: 1.3 9/5-9/6>> LTM EEG: Negative. 9/06>> CTA chest: No PE-no PNA. 9/07>> RPR: Nonreactive 9/10>> bilateral lower extremity Doppler: No DVT 9/19>> Echo: EF 60-65%. 9/23>> CXR: No PNA 9/25>> CT head: No acute intracranial process  Significant microbiology data: 9/23>> COVID PCR positive 9/23>> blood culture: Negative  Procedures: None   Consults: Neurology, psychiatry  Subjective:   No significant events overnight, patient denies any complaints  Objective: Vitals: Blood pressure 102/60, pulse 90, temperature 98.1 F (36.7 C), temperature source Oral, resp. rate 18, SpO2 98 %.   Exam: Awake, good air entry, regular rate and rhythm Assessment/Plan:     Tuberculin test placed  10/20, readding  is negative<5 mm in 48 hours(on 88/50/2774)  Metabolic encephalopathy in a patient with early onset advanced dementia - her acute metabolic encephalopathy has resolved now she is at her baseline unfortunately her baseline is pretty poor due to early onset advanced dementia, she has been seen by neurology and psychiatry.  Her MRI brain was positive for significant cerebral and cerebellar atrophy, continue supportive care.  Will require placement.      Dementia:  Significant atrophy on neuroimaging-chronic finding-neurology suspects patient has progressive dementia.  TSH/RPR stable-B12 being supplemented.  Await SNF bed , [name] cleared by psych for placement.  Vitamin B12 deficiency: Continue supplementation.  Bipolar disorder:  Appreciate psych input-continue Haldol .  Per psych requires SNF/memory care.  Elevated D-dimer  - CTA chest/lower extremity Dopplers negative for VTE.  COVID-19 infection:  Afebrile overnight-no clinical signs of pneumonia.  On room air.  Continue to monitor off treatment.  This problem has resolved.  Sinus tachycardia  Resolved-unclear etiology but thought to be due to dehydration.    Tobacco abuse: Continue transdermal nicotine    BMI: Estimated body mass index is 25.62 kg/m as calculated from the following:   Height as of this encounter: 5\' 6"  (1.676 m).   Weight as of an earlier encounter on 12/24/21: 72 kg.   Code status:   Code Status: Prior   DVT Prophylaxis: enoxaparin (LOVENOX) injection 40 mg Start: 12/26/21 1200    Family Communication: None at bedside   Disposition Plan: Status is: Inpt Planned Discharge Destination: Memory care/SNF-awaiting bed.  Remains medically stable for transfer.  Diet: Diet Order             Diet regular Room service appropriate? No; Fluid consistency:  Thin  Diet effective now                    MEDICATIONS: Scheduled Meds:  atropine  1 drop Sublingual TID   vitamin B-12  1,000 mcg Oral  Daily   docusate sodium  200 mg Oral BID   enoxaparin (LOVENOX) injection  40 mg Subcutaneous Q24H   feeding supplement (KATE FARMS STANDARD 1.4)  325 mL Oral BID BM   haloperidol  1 mg Oral BID   hydrocerin   Topical BID   metoprolol tartrate  25 mg Oral BID   nicotine  7 mg Transdermal Daily   polyethylene glycol  17 g Oral BID   scopolamine  1 patch Transdermal Q72H   Continuous Infusions: PRN Meds:.acetaminophen, alum & mag hydroxide-simeth, fluticasone, hydrOXYzine, LORazepam, magnesium hydroxide, metoprolol tartrate, OLANZapine zydis **AND** ziprasidone, ondansetron, mouth rinse   I have personally reviewed following labs and imaging studies  LABORATORY DATA:  Recent Labs  Lab 02/10/22 0212  WBC 4.9  HGB 13.0  HCT 39.7  PLT 288  MCV 90.4  MCH 29.6  MCHC 32.7  RDW 13.0    Recent Labs  Lab 02/10/22 0212  NA 141  K 3.7  CL 106  CO2 26  GLUCOSE 89  BUN 14  CREATININE 1.19*  CALCIUM 9.3  MG 2.1   Lab Results  Component Value Date   TSH 1.468 01/09/2022    RADIOLOGY STUDIES/RESULTS: No results found.   LOS: 47 days   Signature  Huey Bienenstock M.D on 02/11/2022 at 3:14 PM   -  To page go to [email or web address]

## 2022-02-11 NOTE — Progress Notes (Signed)
Mobility Specialist Progress Note:   02/11/22 1005  Mobility  Activity Ambulated with assistance in hallway  Level of Assistance Contact guard assist, steadying assist  Assistive Device Other (Comment) (HHA)  Distance Ambulated (ft) 500 ft  Activity Response Tolerated well  Mobility Referral Yes  $Mobility charge 1 Mobility   Pt eager for mobility session. Required CGA through HHA. Pt back in bed with all needs met, posey belt on and bed alarm on.   Nelta Numbers Acute Rehab Secure Chat or Office Phone: [phone]

## 2022-02-11 NOTE — TOC Progression Note (Addendum)
Transition of Care [facility]) - Progression Note    Patient Details  Name: Natalie Brown MRN: [id number] Date of Birth: [birth year]  Transition of Care [facility]) CM/SW [place], [place] Work Phone Number: 02/11/2022, 2:26 PM  Clinical Narrative:    MSW intern reached out to several facilities to follow up on referrals: -[facility]: Said no because of her age -[place]: Michela Pitcher no because of her age -[place]: Left VM -[place]: Left VM -[facility]: Left VM -[place]: Only accepts age 59 or older -[place]: Does not have any available beds right now -[facility] ([place]): Willing to look at referral, fax [phone] [facility] ([place]): Willing to look at referral, fax [phone] -[facility] [place]: Left VM -[place]: Left VM -[place]: Does not have any beds available -[facility]: only accepts patients age 78 or older -[place]: Mailbox was full   The team is still waiting to hear back from several other referrals as well.      Barriers to Discharge: Financial Resources, Inadequate or no insurance, Homeless with medical needs  Expected Discharge Plan and Services   In-house Referral: Clinical Social Work   Post Acute Care Choice: Nursing Home Living arrangements for the past 2 months: Homeless [facility]) Expected Discharge Date: 12/24/21                                     Social Determinants of Health (SDOH) Interventions    Readmission Risk Interventions     No data to display

## 2022-02-12 NOTE — Progress Notes (Signed)
PROGRESS NOTE        PATIENT DETAILS Name: Natalie Brown Age: 56 y.o. Sex: female Date of Birth: 1965/07/10 Admit Date: 12/24/2021 Admitting Physician Evalee Mutton Kristeen Mans, MD [id number], [name], NP  Brief Summary:  Patient is a 56 y.o.  female with history of bipolar disorder-who was hospitalized at [facility] received a Ativan challenge for possible catatonia-with minimal improvement-due to persistent altered mental status/tachycardia-patient was admitted to [facility] service for further evaluation and treatment.  No significant finding regarding her tachycardia work-up, 2D echo within normal limit, no significant electrolyte abnormalities.  Evaluated by psychiatry-and unfortunately-patient did not meet criteria to go back to [facility].  While awaiting appropriate disposition-small course complicated by acute [id number] infection.  Significant events: 9/4>> transferred [facility]from [facility]-Altered-no improvement after Ativan challenge. 9/22>> febrile 9/23>> COVID-19 positive  Significant studies: 9/04>> vitamin B12: 290 9/04>> CXR: No obvious pneumonia 9/04>> MRI brain: No acute intracranial abnormality.  Advanced cerebral/cerebellar atrophy 9/04-9/5>> LTM EEG: No seizures. 9/05>> TSH: 1.3 9/5-9/6>> LTM EEG: Negative. 9/06>> CTA chest: No PE-no PNA. 9/07>> RPR: Nonreactive 9/10>> bilateral lower extremity Doppler: No DVT 9/19>> Echo: EF 60-65%. 9/23>> CXR: No PNA 9/25>> CT head: No acute intracranial process  Significant microbiology data: 9/23>> COVID PCR positive 9/23>> blood culture: Negative  Procedures: None   Consults: Neurology, psychiatry  Subjective:   No significant events overnight as discussed with staff, she does not provide any complaints today.     Objective: Vitals: Blood pressure 102/60, pulse 90, temperature 98.1 F (36.7 C), temperature source Oral, resp. rate 18, SpO2 98 %.   Exam: GEN is awake, sitting in bed, with Posey belt, eating  breakfast, clear lungs to auscultation, minimally communicative.  Assessment/Plan:     Tuberculin test placed 10/20, readding  is negative<5 mm in 48 hours(on [id number])  Metabolic encephalopathy in a patient with early onset advanced dementia - her acute metabolic encephalopathy has resolved now she is at her baseline unfortunately her baseline is pretty poor due to early onset advanced dementia, she has been seen by neurology and psychiatry.  Her MRI brain was positive for significant cerebral and cerebellar atrophy, continue supportive care.  Will require placement.      Dementia:  Significant atrophy on neuroimaging-chronic finding-neurology suspects patient has progressive dementia.  TSH/RPR stable-B12 being supplemented.  Await SNF bed , [name] cleared by psych for placement.  Vitamin B12 deficiency: Continue supplementation.  Bipolar disorder:  Appreciate psych input-continue Haldol .  Per psych requires SNF/memory care.  Elevated D-dimer  - CTA chest/lower extremity Dopplers negative for VTE.  COVID-19 infection:  Afebrile overnight-no clinical signs of pneumonia.  On room air.  Continue to monitor off treatment.  This problem has resolved.  Sinus tachycardia  Resolved-unclear etiology but thought to be due to dehydration.    Tobacco abuse: Continue transdermal nicotine    BMI: Estimated body mass index is 25.62 kg/m as calculated from the following:   Height as of this encounter: 5\' 6"  (1.676 m).   Weight as of an earlier encounter on 12/24/21: 72 kg.   Code status:   Code Status: Prior   DVT Prophylaxis: enoxaparin (LOVENOX) injection 40 mg Start: 12/26/21 1200    Family Communication: None at bedside   Disposition Plan: Status is: Inpt Planned Discharge Destination: Memory care/SNF-awaiting bed.  Remains medically stable for transfer.  Diet: Diet Order  Diet regular Room service appropriate? No; Fluid consistency: Thin  Diet effective now                     MEDICATIONS: Scheduled Meds:  atropine  1 drop Sublingual TID   vitamin B-12  1,000 mcg Oral Daily   docusate sodium  200 mg Oral BID   enoxaparin (LOVENOX) injection  40 mg Subcutaneous Q24H   feeding supplement (KATE FARMS STANDARD 1.4)  325 mL Oral BID BM   haloperidol  1 mg Oral BID   hydrocerin   Topical BID   metoprolol tartrate  25 mg Oral BID   nicotine  7 mg Transdermal Daily   polyethylene glycol  17 g Oral BID   scopolamine  1 patch Transdermal Q72H   Continuous Infusions: PRN Meds:.acetaminophen, alum & mag hydroxide-simeth, fluticasone, hydrOXYzine, LORazepam, magnesium hydroxide, metoprolol tartrate, OLANZapine zydis **AND** ziprasidone, ondansetron, mouth rinse   I have personally reviewed following labs and imaging studies  LABORATORY DATA:  Recent Labs  Lab 02/10/22 0212  WBC 4.9  HGB 13.0  HCT 39.7  PLT 288  MCV 90.4  MCH 29.6  MCHC 32.7  RDW 13.0    Recent Labs  Lab 02/10/22 0212  NA 141  K 3.7  CL 106  CO2 26  GLUCOSE 89  BUN 14  CREATININE 1.19*  CALCIUM 9.3  MG 2.1   Lab Results  Component Value Date   TSH 1.468 01/09/2022    RADIOLOGY STUDIES/RESULTS: No results found.   LOS: 1 days   Signature  Phillips Climes M.D on 02/12/2022 at 2:50 PM   -  To page go to [email or web address]

## 2022-02-12 NOTE — Progress Notes (Signed)
Mobility Specialist Progress Note:   02/12/22 1000  Mobility  Activity Ambulated with assistance in hallway  Level of Assistance Contact guard assist, steadying assist  Assistive Device Other (Comment) (HHA)  Distance Ambulated (ft) 500 ft  Activity Response Tolerated well  Mobility Referral Yes  $Mobility charge 1 Mobility   Pt eager for mobility session. Required CGA through [facility] for comfort. Pt back in bed with all needs met, bed alarm on.   Natalie Brown Acute Rehab Secure Chat or Office Phone: [phone]

## 2022-02-12 NOTE — Plan of Care (Signed)
  Problem: Safety: Goal: Non-violent Restraint(s) Outcome: Progressing   Problem: Education: Goal: Knowledge of General Education information will improve Description: Including pain rating scale, medication(s)/side effects and non-pharmacologic comfort measures Outcome: Progressing   Problem: Activity: Goal: Risk for activity intolerance will decrease Outcome: Progressing   Problem: Nutrition: Goal: Adequate nutrition will be maintained Outcome: Progressing   Problem: Coping: Goal: Level of anxiety will decrease Outcome: Progressing   

## 2022-02-12 NOTE — TOC Progression Note (Signed)
Transition of Care [facility]) - Progression Note    Patient Details  Name: Natalie Brown MRN: [id number] Date of Birth: 09-19-1965  Transition of Care [facility]) CM/SW [place], LCSW Phone Number: 02/12/2022, 4:18 PM  Clinical Narrative:    Mendel Corning reviewing patient. No memory care beds at [facility].      Barriers to Discharge: Financial Resources, Inadequate or no insurance, Homeless with medical needs  Expected Discharge Plan and Services   In-house Referral: Clinical Social Work   Post Acute Care Choice: Nursing Home Living arrangements for the past 2 months: Homeless [facility]) Expected Discharge Date: 12/24/21                                     Social Determinants of Health (SDOH) Interventions    Readmission Risk Interventions     No data to display

## 2022-02-12 NOTE — Progress Notes (Signed)
Mobility Specialist Progress Note:   02/12/22 1420  Mobility  Activity Ambulated with assistance to bathroom  Level of Assistance Standby assist, set-up cues, supervision of patient - no hands on  Assistive Device None  Distance Ambulated (ft) 30 ft  Activity Response Tolerated well  Mobility Referral Yes  $Mobility charge 1 Mobility   Pt requesting assistance to go to [facility]. No physical assistance required. Pt back in bed with all needs met. Bed alarm and posey belt on.   Nelta Numbers Acute Rehab Secure Chat or Office Phone: [phone]

## 2022-02-13 NOTE — TOC Progression Note (Signed)
Transition of Care [facility]) - Progression Note    Patient Details  Name: RYANA MONTECALVO MRN: [id number] Date of Birth: 03/18/1966  Transition of Care [facility]) CM/SW [place], LCSW Phone Number: 02/13/2022, 4:02 PM  Clinical Narrative:    Awaiting determination from [facility] memory care. No beds available at [facility]. Left voicemail for [place] at [facility] care/alf in [place].      Barriers to Discharge: Financial Resources, Inadequate or no insurance, Homeless with medical needs  Expected Discharge Plan and Services   In-house Referral: Clinical Social Work   Post Acute Care Choice: Nursing Home Living arrangements for the past 2 months: Homeless [facility]) Expected Discharge Date: 12/24/21                                     Social Determinants of Health (SDOH) Interventions    Readmission Risk Interventions     No data to display

## 2022-02-13 NOTE — Progress Notes (Signed)
  Progress Note   Patient: Natalie Brown POE:[id number] DOB: [birth year] DOA: 12/24/2021     49 DOS: the patient was seen and examined on 02/13/2022       Brief hospital course: Natalie Brown is a 56 year old F with bipolar who was initially admitted to behavioral health with catatonia, treated with Ativan challenge, no improvement and then transferred to the medical unit for persistent altered mental status and tachycardia.  As of summary by Dr. Waldron Labs from 10/24   Assessment and Plan: * Dementia The patient had an acute metabolic encephalopathy which is resolved, and psychiatry and Neurology feel she has underlying early onset dementia, now at baseline.  B12 deficiency -Continue vitamin B12  Sinus tachycardia - Continue metoprolol  Bipolar disorder -Continue Haldol and PRN Olanzapine or Geodon - Consult Psychiatry, appreciate cares  COVID-19 infection Resolved   Tobacco abuse Continue nicotine patch           Subjective: Patient is unable to make her needs known.  Nursing of no concerns, no fever, no respiratory distress, no vomiting.     Physical Exam: BP (!) 122/55 (BP Location: Right Arm)   Pulse 90   Temp 98.5 F (36.9 C) (Oral)   Resp 18   Ht 5\' 6"  (1.676 m)   SpO2 95%   BMI 25.62 kg/m   Adult female, sitting up in bed, appears to have some posturing and psychomotor slowing RRR, no murmurs, no peripheral edema Respiratory rate normal, lungs clear without rales or wheezes Abdomen soft without tenderness palpation or guarding, no ascites or distention   Data Reviewed: No new labs       Disposition: Status is: Inpatient Patient is medically stable for discharge when bed is available        Author: Edwin Dada, MD 02/13/2022 7:25 PM  For on call review [email or web address].

## 2022-02-13 NOTE — Progress Notes (Signed)
Mobility Specialist Progress Note:   02/13/22 1030  Mobility  Activity Ambulated with assistance in hallway  Level of Assistance Contact guard assist, steadying assist  Assistive Device Other (Comment) (HHA)  Distance Ambulated (ft) 500 ft  Activity Response Tolerated well  Mobility Referral Yes  $Mobility charge 1 Mobility   Pt eager for mobility session. Required only HHA for comfort. Pt back in bed with all needs met, bed alarm on.  Nelta Numbers Acute Rehab Secure Chat or Office Phone: [phone]

## 2022-02-14 NOTE — Hospital Course (Addendum)
56 y.o. 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  initially admitted to [facility] 08/2021 with agitation and aggressive behaviors.  Rx manic Bipolar, but after several months in [facility] noted significant cognitive impairment, and dementia was suspected.  Patient was observed to have significant short term memory impairment and worsening hygiene, MOCA 11/30 and CT head revealed advanced cerebral atrophy.    September 2023, patient was transferred for further evaluation and treatment due to concerns of new metabolic encephalopathy (that appear to have developed acutely over ~1 day).  Neurology and Psychiatry were consulted.  EEG was unremarkable.  Ammonia, TSH and RPR were normal.  MRI brain revealed no acute findings, but confirmed advanced atrophy.  Neurology suspected a particularly aggressive premature dementia.     Initially forgetful but redirectable and oriented, she is now abulic and essentially nonverbal (nursing note occasional staff or some family members can elicit more responses, but they are very limited).  Cannot bathe or toilet independently.  Patient holds food in the mouth, recent calorie count (7/26-7/29) was observed to eat 50% of meals or less.   Nursing report she does not swallow her pills regularly due to pocketing of pills or holding pills in the mouth for long periods.  In the last 6 months, she has had 22 kg weight loss and has become noticeably weaker and more prone to falls when attempting to walk (which she cannot do safely without assistance).   Palliative have been consulted and have met with family. Family are aware her dementia and weight loss are terminal.  Patient developed lower extremity tremor seeming to coincide with up-titration of Zyprexa. Unclear if this is a medication reaction/adverse effect vs primary vs behavioral. Neurology consulted and ammonia checked and normal, tremors are  noted but mostly when she is restless behavioral versus side effects of Zyprexa, but does not have rigidity consider a small dose of Benadryl or hydroxyzine prn, no need for repeating further imaging, follow-up with psychiatry. Disposition has been decided in agreement with patient's guardian mother and other involved parties, all consultants have cleared the patient at this time she is ambulating, self-feeding and is medically stable for discharge to a facility with locked unit-confirmed with Child psychotherapist. Her discharge was scheduled for 01/22/2023.

## 2022-02-14 NOTE — TOC Progression Note (Addendum)
Transition of Care [facility] - Progression Note    Patient Details  Name: Natalie Brown MRN: [id number] Date of Birth: [birth year]  Transition of Care [facility]) CM/SW [place], LCSW Phone Number: 02/14/2022, 9:04 AM  Clinical Narrative:    M[facility] Cor[facility] unable to accept patient.  -CSW left voicemail for [employer].  -No beds at [facility]. -No beds at [facility].    Barriers to Discharge: Financial Resources, Inadequate or no insurance, Homeless with medical needs  Expected Discharge Plan and Services   In-house Referral: Clinical Social Work   Post Acute Care Choice: Nursing Home Living arrangements for the past 2 months: Homeless [facility]) Expected Discharge Date: 12/24/21                                     Social Determinants of Health (SDOH) Interventions    Readmission Risk Interventions     No data to display

## 2022-02-14 NOTE — Progress Notes (Signed)
PROGRESS NOTE Natalie Brown  [id number] DOB: [birth year] DOA: 12/24/2021 PCP: Lavinia Sharps, NP   Brief Narrative/Hospital Course: 56 y.o.f w/ history of bipolar disorder hospitalized at [facility] received a Ativan challenge for possible catatonia-with minimal improvement-due to persistent altered mental status/tachycardia patient was admitted to [facility] service 12/24/21. Febrile and Covid 19 + initially 9/23 but no pneumonia.She underwent further work-up with MRI brain 9/4-no acute finding advanced cerebral/cerebellar atrophy, LTM EEG 9/04-9/5, 9/5>9/9, on no seizure TSH stable CTA chest 9/6 no PE no pneumonia RPR nonreactive bilateral LE Doppler negative for DVT Echo with normal EF 9/19, repeat CT brain unremarkable 9/25.  She has had prolonged hospitalization seen by psychiatry, per psychiatry does not meet criteria to go back to behavioral health, awaiting for placement to memory care    Subjective: Seen and examined this morning Has belt around to prevent falls.  Was eating food does not answer questions States" I need to go to the bathroom"   Assessment and Plan: Principal Problem:   Acute encephalopathy Active Problems:   Bipolar affective disorder, current episode manic with psychotic symptoms (HCC)   Tobacco abuse   Dementia with behavioral disturbance (HCC)   Altered mental state   Early onset dementia Memory loss: Patient had acute metabolic encephalopathy-usual, seen by psychiatry neurology at this time felt that she has underlying early onset dementia, now at baseline.  Extensive work-up negative with multiple labs CT head MRI LTM EEG.  Continue B12 supplement, Haldol 1 mg twice daily Bipolar disorder on Haldol and as needed olanzapine seen by psychiatry signed off B12 deficiency continue replacement  Sinus tachycardia controlled on metoprolol COVID-19 infection 9/23:Resolved Tobacco abuse:Continue nicotine patch   DVT prophylaxis: enoxaparin (LOVENOX) injection 40 mg Start:  12/26/21 1200 Code Status:   Code Status: Prior Family Communication: plan of care discussed with patient at bedside. Patient status is: Inpatient because of disposition Level of care: Med-Surg   Dispo: The patient is from: home            Anticipated disposition: Memory care unit.  Current activity level 5 walks with assist in room/home Objective: Vitals last 24 hrs: Vitals:   02/13/22 2351 02/14/22 0335 02/14/22 0812 02/14/22 1259  BP: 101/65 (!) 96/57 122/85 104/72  Pulse: 68  71 83  Resp: 16 14 16 18   Temp: 98.4 F (36.9 C) 97.9 F (36.6 C) 98.1 F (36.7 C) 98 F (36.7 C)  TempSrc: Oral Oral Oral Oral  SpO2:  94%    Height:       Weight change:   Physical Examination: General exam: alert awake, does not answer any questions HEENT:Oral mucosa moist, Ear/Nose WNL grossly Respiratory system: bilaterally clear BS, no use of accessory muscle Cardiovascular system: S1 & S2 +, No JVD. Gastrointestinal system: Abdomen soft,NT,ND, BS+ Nervous System:Alert, awake, moving extremities. Extremities: LE edema neg,distal peripheral pulses palpable.  Skin: No rashes,no icterus. MSK: Normal muscle bulk,tone, power  Medications reviewed:  Scheduled Meds:  Continuous Infusions:   Diet Order             Diet regular Room service appropriate? No; Fluid consistency: Thin  Diet effective now                 No intake or output data in the 24 hours ending 02/14/22 1357 Net IO Since Admission: 13,623 mL [02/14/22 1357]  Wt Readings from Last 3 Encounters:  12/24/21 72 kg  09/05/21 72.6 kg  11/20/20 73.9 kg     Unresulted  Labs (From admission, onward)     Start     Ordered   01/27/22 2992  Basic metabolic panel  Weekly,   R     Question:  Specimen collection method  Answer:  Lab=Lab collect   01/25/22 1855   01/27/22 0000  CBC  Weekly,   R     Question:  Specimen collection method  Answer:  Lab=Lab collect   01/25/22 1855   01/27/22 0000  Magnesium  Weekly,   R      Question:  Specimen collection method  Answer:  Lab=Lab collect   01/25/22 1855          Data Reviewed: I have personally reviewed following labs and imaging studies CBC: Recent Labs  Lab 02/10/22 0212  WBC 4.9  HGB 13.0  HCT 39.7  MCV 90.4  PLT 426   Basic Metabolic Panel: Recent Labs  Lab 02/10/22 0212  NA 141  K 3.7  CL 106  CO2 26  GLUCOSE 89  BUN 14  CREATININE 1.19*  CALCIUM 9.3  MG 2.1  No results found for this or any previous visit (from the past 240 hour(s)).  Antimicrobials: Anti-infectives (From admission, onward)    None      Culture/Microbiology    Component Value Date/Time   SDES BLOOD LEFT HAND 01/12/2022 0828   SDES BLOOD RIGHT HAND 01/12/2022 0828   SPECREQUEST  01/12/2022 0828    BOTTLES DRAWN AEROBIC ONLY Blood Culture results may not be optimal due to an inadequate volume of blood received in culture bottles   SPECREQUEST  01/12/2022 0828    BOTTLES DRAWN AEROBIC ONLY Blood Culture results may not be optimal due to an inadequate volume of blood received in culture bottles   CULT  01/12/2022 0828    NO GROWTH 5 DAYS Performed at [facility], [place] [address], [place], [place] [address]    CULT  01/12/2022 [phone]    NO GROWTH 5 DAYS Performed at [facility], [place] [address] [place], [place] [address]    REPTSTATUS 01/17/2022 FINAL 01/12/2022 2119   REPTSTATUS 01/17/2022 FINAL 01/12/2022 4174  Radiology Studies: No results found.   LOS: 50 days   Antonieta Pert, MD [facility]  02/14/2022, 1:57 PM

## 2022-02-14 NOTE — Progress Notes (Signed)
Mobility Specialist Progress Note:   02/14/22 0945  Mobility  Activity Ambulated with assistance to bathroom  Level of Assistance Standby assist, set-up cues, supervision of patient - no hands on  Assistive Device None  Distance Ambulated (ft) 30 ft  Activity Response Tolerated well  Mobility Referral Yes  $Mobility charge 1 Mobility   Pt requesting to change gown, then go to BR for BM. Pt ambulated with no assistance, with mildly unsteady gait. Back in bed with all needs met, bed alarm on.  Nelta Numbers Acute Rehab Secure Chat or Office Phone: [phone]

## 2022-02-14 NOTE — TOC Progression Note (Cosign Needed)
Transition of Care [facility]) - Progression Note    Patient Details  Name: Natalie Brown MRN: [id number] Date of Birth: 03-May-1965  Transition of Care [facility]) CM/SW [place], [place] Work Phone Number: 02/14/2022, 9:30 AM  Clinical Narrative:    MSW intern reached out to a few facilities: -[facility]: Stated they do not have a memory care unit. -[facility]: Left VM. -[facility]: Stated they do not have any memory care beds available at this time. -[facility]: Left VM. -[facility]: Stated to email referral to [email or web address] -[facility]: Followed up on referral. Admissions coordinator asked we call the Director, Amy at [phone].     Barriers to Discharge: Financial Resources, Inadequate or no insurance, Homeless with medical needs  Expected Discharge Plan and Services   In-house Referral: Clinical Social Work   Post Acute Care Choice: Nursing Home Living arrangements for the past 2 months: Homeless [facility]) Expected Discharge Date: 12/24/21                                     Social Determinants of Health (SDOH) Interventions    Readmission Risk Interventions     No data to display

## 2022-02-15 NOTE — Progress Notes (Signed)
Mobility Specialist: Progress Note   02/15/22 1623  Mobility  Activity Ambulated with assistance in hallway  Level of Assistance Contact guard assist, steadying assist  Assistive Device Other (Comment) (HHA)  Distance Ambulated (ft) 500 ft  Activity Response Tolerated well  Mobility Referral Yes  $Mobility charge 1 Mobility   Received pt in bed having no complaints and agreeable to mobility. Pt was asymptomatic throughout ambulation and returned to room w/o fault. Left in bed w/ call bell in reach and all needs met.  Lynn Markella Dao Mobility Specialist Secure Chat Only

## 2022-02-15 NOTE — Progress Notes (Signed)
PROGRESS NOTE Natalie Brown  [id number] DOB: 01-13-1966 DOA: 12/24/2021 PCP: Marliss Coots, NP   Brief Narrative/Hospital Course: 56 y.o.f w/ history of bipolar disorder hospitalized at [facility] received a Ativan challenge for possible catatonia-with minimal improvement-due to persistent altered mental status/tachycardia patient was admitted to [facility] service 12/24/21. Febrile and Covid 19 + initially 9/23 but no pneumonia.She underwent further work-up with MRI brain 9/4-no acute finding advanced cerebral/cerebellar atrophy, LTM EEG 9/04-9/5, 9/5>9/9, on no seizure TSH stable CTA chest 9/6 no PE no pneumonia RPR nonreactive bilateral LE Doppler negative for DVT Echo with normal EF 9/19, repeat CT brain unremarkable 9/25.  She has had prolonged hospitalization seen by psychiatry, per psychiatry does not meet criteria to go back to behavioral health, awaiting for placement to memory care   Subjective: Seen and examined.  Eating her breakfast alert awake Does not follow other commands Overnight afebrile  On telemetry sitter and waist belt   Assessment and Plan: Principal Problem:   Acute encephalopathy Active Problems:   Bipolar affective disorder, current episode manic with psychotic symptoms (HCC)   Tobacco abuse   Dementia with behavioral disturbance (HCC)   Altered mental state   Early onset dementia Memory loss: Patient had acute metabolic encephalopathy-usual, seen by psychiatry neurology at this time felt that she has underlying early onset dementia, now at baseline.  Extensive work-up negative with multiple labs CT head MRI LTM EEG.  Continue B12 supplement, Haldol 1 mg twice daily  Bipolar disorder on Haldol and as needed olanzapine seen by psychiatry signed off B12 deficiency continue replacement  Sinus tachycardia controlled on metoprolol COVID-19 infection 9/23:Resolved Tobacco abuse:Continue nicotine patch   DVT prophylaxis: enoxaparin (LOVENOX) injection 40 mg Start:  12/26/21 1200 Code Status:   Code Status: Prior Family Communication: plan of care discussed with staff/toc  Patient status is: Inpatient because of disposition Level of care: Med-Surg  Dispo: The patient is from: home            Anticipated disposition: Memory care unit.  Current activity level 5 walks with assist in room/home Objective: Vitals last 24 hrs: Vitals:   02/14/22 0335 02/14/22 0812 02/14/22 1259 02/15/22 0818  BP: (!) 96/57 122/85 104/72 102/88  Pulse:  71 83 74  Resp: 14 16 18 18   Temp: 97.9 F (36.6 C) 98.1 F (36.7 C) 98 F (36.7 C) 98.4 F (36.9 C)  TempSrc: Oral Oral Oral Oral  SpO2: 94%     Height:       Weight change:   Physical Examination: General exam:  weak,older appearing HEENT:Oral mucosa moist, Ear/Nose WNL grossly, dentition normal. Respiratory system: bilaterally clear BS, no use of accessory muscle Cardiovascular system: S1 & S2 +, regular rate. Gastrointestinal system: Abdomen soft, NT,ND,BS+ Nervous System:Alert, awake not following commands, moving extremities and grossly nonfocal Extremities: LE ankle edema , lower extremities warm Skin: No rashes,no icterus. MSK: Normal muscle bulk,tone, power   Medications reviewed:  Scheduled Meds:  Continuous Infusions:   Diet Order             Diet regular Room service appropriate? No; Fluid consistency: Thin  Diet effective now                 No intake or output data in the 24 hours ending 02/15/22 0847 Net IO Since Admission: 13,623 mL [02/15/22 0847]  Wt Readings from Last 3 Encounters:  12/24/21 72 kg  09/05/21 72.6 kg  11/20/20 73.9 kg     Unresulted Labs (  From admission, onward)     Start     Ordered   01/27/22 7893  Basic metabolic panel  Weekly,   R     Question:  Specimen collection method  Answer:  Lab=Lab collect   01/25/22 1855   01/27/22 0000  CBC  Weekly,   R     Question:  Specimen collection method  Answer:  Lab=Lab collect   01/25/22 1855   01/27/22 0000   Magnesium  Weekly,   R     Question:  Specimen collection method  Answer:  Lab=Lab collect   01/25/22 1855          Data Reviewed: I have personally reviewed following labs and imaging studies CBC: Recent Labs  Lab 02/10/22 0212  WBC 4.9  HGB 13.0  HCT 39.7  MCV 90.4  PLT 810    Basic Metabolic Panel: Recent Labs  Lab 02/10/22 0212  NA 141  K 3.7  CL 106  CO2 26  GLUCOSE 89  BUN 14  CREATININE 1.19*  CALCIUM 9.3  MG 2.1   No results found for this or any previous visit (from the past 240 hour(s)).  Antimicrobials: Anti-infectives (From admission, onward)    None      Culture/Microbiology    Component Value Date/Time   SDES BLOOD LEFT HAND 01/12/2022 0828   SDES BLOOD RIGHT HAND 01/12/2022 0828   SPECREQUEST  01/12/2022 0828    BOTTLES DRAWN AEROBIC ONLY Blood Culture results may not be optimal due to an inadequate volume of blood received in culture bottles   SPECREQUEST  01/12/2022 0828    BOTTLES DRAWN AEROBIC ONLY Blood Culture results may not be optimal due to an inadequate volume of blood received in culture bottles   CULT  01/12/2022 0828    NO GROWTH 5 DAYS Performed at [facility], [place] [address], [place], [place] [address]    CULT  01/12/2022 [phone]    NO GROWTH 5 DAYS Performed at [facility], [place] [address], [place],  [address]    REPTSTATUS 01/17/2022 FINAL 01/12/2022 4235   REPTSTATUS 01/17/2022 FINAL 01/12/2022 3614  Radiology Studies: No results found.   LOS: 70 days   Antonieta Pert, MD [facility]  02/15/2022, 8:47 AM

## 2022-02-16 NOTE — Plan of Care (Signed)
  Problem: Education: Goal: Knowledge of General Education information will improve Description: Including pain rating scale, medication(s)/side effects and non-pharmacologic comfort measures Outcome: Progressing   Problem: Health Behavior/Discharge Planning: Goal: Ability to manage health-related needs will improve Outcome: Progressing   Problem: Clinical Measurements: Goal: Ability to maintain clinical measurements within normal limits will improve Outcome: Progressing Goal: Will remain free from infection Outcome: Progressing   Problem: Safety: Goal: Non-violent Restraint(s) Outcome: Progressing

## 2022-02-16 NOTE — Progress Notes (Signed)
PROGRESS NOTE Natalie Brown  [id number] DOB: Mar 08, 1966 DOA: 12/24/2021 PCP: Marliss Coots, NP   Brief Narrative/Hospital Course: 55 y.o.f w/ history of bipolar disorder hospitalized at [facility] received a Ativan challenge for possible catatonia-with minimal improvement-due to persistent altered mental status/tachycardia patient was admitted to [facility] service 12/24/21. Febrile and Covid 19 + initially 9/23 but no pneumonia.She underwent further work-up with MRI brain 9/4-no acute finding advanced cerebral/cerebellar atrophy, LTM EEG 9/04-9/5, 9/5>9/9, on no seizure TSH stable CTA chest 9/6 no PE no pneumonia RPR nonreactive bilateral LE Doppler negative for DVT Echo with normal EF 9/19, repeat CT brain unremarkable 9/25.  She has had prolonged hospitalization seen by psychiatry, per psychiatry does not meet criteria to go back to behavioral health, awaiting for placement to memory care   Subjective: Seen and examined.   Sitting in the bed eating her food  Today she is able to tell me her name  Otherwise not following any commands   Assessment and Plan: Principal Problem:   Acute encephalopathy Active Problems:   Bipolar affective disorder, current episode manic with psychotic symptoms ([place])   Tobacco abuse   Dementia with behavioral disturbance (HCC)   Altered mental state   Early onset dementia Memory loss: Patient had acute metabolic encephalopathy-usual, seen by psychiatry neurology at this time felt that she has underlying early onset dementia, now at baseline.  Extensive work-up negative with multiple labs CT head MRI LTM EEG.  Continue B12 supplement, Haldol 1 mg twice daily.  At time responds, abel to say her name when asked  Bipolar disorder on Haldol and as needed olanzapine seen by psychiatry signed off B12 deficiency continue replacement  Sinus tachycardia controlled on metoprolol COVID-19 infection 9/23:Resolved Tobacco abuse:Continue nicotine patch   DVT prophylaxis:  enoxaparin (LOVENOX) injection 40 mg Start: 12/26/21 1200 Code Status:   Code Status: Prior Family Communication: plan of care discussed with staff/toc  Patient status is: Inpatient because of disposition Level of care: Med-Surg  Dispo: The patient is from: home            Anticipated disposition: Memory care unit.  Current activity level 5 walks with assist in room/home Objective: Vitals last 24 hrs: Vitals:   02/15/22 1237 02/15/22 2044 02/16/22 0600 02/16/22 0809  BP: 102/66 103/62 110/65 101/65  Pulse: 74 82 82 92  Resp: 18 18 18 19   Temp: 98.2 F (36.8 C)  98.1 F (36.7 C) 98.5 F (36.9 C)  TempSrc: Oral  Oral Oral  SpO2:  100% 98% 96%  Height:       Weight change:   Physical Examination: General exam: AA, weak, confused weak,older appearing HEENT:Oral mucosa moist, Ear/Nose WNL grossly, dentition normal. Respiratory system: bilaterally BS, no use of accessory muscle Cardiovascular system: S1 & S2 +, regular rate, JVD neg. Gastrointestinal system: Abdomen soft, NT,ND,BS+ Nervous System:Alert, awake, moving extremities and grossly nonfocal Extremities: LE ankle edema neg, lower extremities warm Skin: No rashes,no icterus. MSK: Normal muscle bulk,tone, power   Medications reviewed:  Scheduled Meds:  Continuous Infusions:   Diet Order             Diet regular Room service appropriate? No; Fluid consistency: Thin  Diet effective now                  Intake/Output Summary (Last 24 hours) at 02/16/2022 1135 Last data filed at 02/16/2022 1000 Gross per 24 hour  Intake 240 ml  Output --  Net 240 ml   Net  IO Since Admission: 14,223 mL [02/16/22 1135]  Wt Readings from Last 3 Encounters:  12/24/21 72 kg  09/05/21 72.6 kg  11/20/20 73.9 kg     Unresulted Labs (From admission, onward)     Start     Ordered   01/27/22 0000  Basic metabolic panel  Weekly,   R     Question:  Specimen collection method  Answer:  Lab=Lab collect   01/25/22 1855   01/27/22  0000  CBC  Weekly,   R     Question:  Specimen collection method  Answer:  Lab=Lab collect   01/25/22 1855   01/27/22 0000  Magnesium  Weekly,   R     Question:  Specimen collection method  Answer:  Lab=Lab collect   01/25/22 1855          Data Reviewed: I have personally reviewed following labs and imaging studies CBC: Recent Labs  Lab 02/10/22 0212  WBC 4.9  HGB 13.0  HCT 39.7  MCV 90.4  PLT 288    Basic Metabolic Panel: Recent Labs  Lab 02/10/22 0212  NA 141  K 3.7  CL 106  CO2 26  GLUCOSE 89  BUN 14  CREATININE 1.19*  CALCIUM 9.3  MG 2.1   No results found for this or any previous visit (from the past 240 hour(s)).  Antimicrobials: Anti-infectives (From admission, onward)    None      Culture/Microbiology    Component Value Date/Time   SDES BLOOD LEFT HAND 01/12/2022 0828   SDES BLOOD RIGHT HAND 01/12/2022 0828   SPECREQUEST  01/12/2022 0828    BOTTLES DRAWN AEROBIC ONLY Blood Culture results may not be optimal due to an inadequate volume of blood received in culture bottles   SPECREQUEST  01/12/2022 0828    BOTTLES DRAWN AEROBIC ONLY Blood Culture results may not be optimal due to an inadequate volume of blood received in culture bottles   CULT  01/12/2022 0828    NO GROWTH 5 DAYS Performed at [facility], [address], [place], [place] [address]    CULT  01/12/[phone]    NO GROWTH 5 DAYS Performed at [facility], [address] [place], [place] [address]    REPTSTATUS 01/17/2022 FINAL 01/12/2022 5035   REPTSTATUS 01/17/2022 FINAL 01/12/2022 4656  Radiology Studies: No results found.   LOS: 52 days   Lanae Boast, MD [facility]  02/16/2022, 11:35 AM

## 2022-02-17 LAB — BASIC METABOLIC PANEL
Anion gap: 9 (ref 5–15)
BUN: 18 mg/dL (ref 6–20)
CO2: 25 mmol/L (ref 22–32)
Calcium: 8.9 mg/dL (ref 8.9–10.3)
Chloride: 108 mmol/L (ref 98–111)
Creatinine, Ser: 1.13 mg/dL — ABNORMAL HIGH (ref 0.44–1.00)
GFR, Estimated: 58 mL/min — ABNORMAL LOW (ref >60)
Glucose, Bld: 100 mg/dL — ABNORMAL HIGH (ref 70–99)
Potassium: 3.9 mmol/L (ref 3.5–5.1)
Sodium: 142 mmol/L (ref 135–145)

## 2022-02-17 LAB — CBC
HCT: 37.7 % (ref 36.0–46.0)
Hemoglobin: 12.4 g/dL (ref 12.0–15.0)
MCH: 29.7 pg (ref 26.0–34.0)
MCHC: 32.9 g/dL (ref 30.0–36.0)
MCV: 90.2 fL (ref 80.0–100.0)
Platelets: 304 10*3/uL (ref 150–400)
RBC: 4.18 MIL/uL (ref 3.87–5.11)
RDW: 13.2 % (ref 11.5–15.5)
WBC: 5.2 10*3/uL (ref 4.0–10.5)
nRBC: 0 % (ref 0.0–0.2)

## 2022-02-17 LAB — MAGNESIUM: Magnesium: 2 mg/dL (ref 1.7–2.4)

## 2022-02-17 NOTE — Plan of Care (Signed)
  Problem: Safety: Goal: Non-violent Restraint(s) Outcome: Progressing   

## 2022-02-17 NOTE — Progress Notes (Signed)
PROGRESS NOTE Natalie Brown  GYB:[id number] DOB: 1965-07-05 DOA: 12/24/2021 PCP: Marliss Coots, NP   Brief Narrative/Hospital Course: 56 y.o.f w/ history of bipolar disorder hospitalized at [facility] received Ativan challenge for possible catatonia-with minimal improvement-due to persistent altered mental status/tachycardia patient was admitted to [facility] service 12/24/21. Febrile and Covid 19 + initially 9/23 but no pneumonia.She underwent further work-up with MRI brain 9/4-no acute finding, noted advanced cerebral/cerebellar atrophy, LTM EEG 9/4-9/5, 9/5>9/9,no seizure,TSH stable. CTA chest 9/6 no PE no pneumonia, RPR nonreactive, bilateral LE Doppler negative for DVT. Echo with normal EF 9/19, repeat CT brain unremarkable 9/25.  She has had prolonged hospitalization seen by psychiatry, per psychiatry does not meet criteria to go back to behavioral health, awaiting for placement to memory care  Subjective: Met patient looking at a magazine, refusing to communicate until I took magazine away.  Was able to tell me her name and followed commands.\     Assessment and Plan: Principal Problem:   Acute encephalopathy Active Problems:   Bipolar affective disorder, current episode manic with psychotic symptoms (HCC)   Tobacco abuse   Dementia with behavioral disturbance (HCC)   Altered mental state   ?Early onset dementia ?Memory loss Patient with metabolic encephalopathy Seen by psychiatry, neurology at this time felt that she has underlying early onset dementia, now at baseline.  Extensive work-up negative with multiple labs CT head MRI LTM EEG.  Continue B12 supplement, Haldol 1 mg twice daily.  At time responds, abel to say her name when asked  Bipolar disorder on Haldol and as needed olanzapine seen by psychiatry signed off B12 deficiency continue replacement  Sinus tachycardia, resolved controlled on metoprolol COVID-19 infection 9/23:Resolved Tobacco abuse:Continue nicotine patch   DVT  prophylaxis: enoxaparin (LOVENOX) injection 40 mg Start: 12/26/21 1200 Code Status:   Code Status: Prior Family Communication: None at bedside    Patient status is: Inpatient because of disposition Level of care: Med-Surg  Dispo: The patient is from: home            Anticipated disposition: Memory care unit.  Current activity level 5, walks with assist in room/home   Objective: Vitals last 24 hrs: Vitals:   02/17/22 0433 02/17/22 0941 02/17/22 1200 02/17/22 1638  BP: 114/71 106/63 113/63 137/89  Pulse: 82 84 95 87  Resp: _0 Temp: 98.4 F (36.9 C)  98.3 F (36.8 C)   TempSrc: Oral  Oral   SpO2: 96% 97% 96% 99%  Height:       Weight change:   Physical Examination: General: NAD  Cardiovascular: S1, S2 present Respiratory: CTAB Abdomen: Soft, nontender, nondistended, bowel sounds present Musculoskeletal: No bilateral pedal edema noted Skin: Normal Psychiatry: Unable to assess   Medications reviewed:  Scheduled Meds:  Continuous Infusions:   Diet Order             Diet regular Room service appropriate? No; Fluid consistency: Thin  Diet effective now                  Intake/Output Summary (Last 24 hours) at 02/17/2022 1708 Last data filed at 02/16/2022 2000 Gross per 24 hour  Intake 240 ml  Output --  Net 240 ml   Net IO Since Admission: 14,823 mL [02/17/22 1708]  Wt Readings from Last 3 Encounters:  12/24/21 72 kg  09/05/21 72.6 kg  11/20/20 73.9 kg     Unresulted Labs (From admission, onward)     Start  Ordered   01/27/22 3762  Basic metabolic panel  Weekly,   R     Question:  Specimen collection method  Answer:  Lab=Lab collect   01/25/22 1855   01/27/22 0000  CBC  Weekly,   R     Question:  Specimen collection method  Answer:  Lab=Lab collect   01/25/22 1855   01/27/22 0000  Magnesium  Weekly,   R     Question:  Specimen collection method  Answer:  Lab=Lab collect   01/25/22 1855          Data Reviewed: I have personally  reviewed following labs and imaging studies CBC: Recent Labs  Lab 02/17/22 0231  WBC 5.2  HGB 12.4  HCT 37.7  MCV 90.2  PLT 831   Basic Metabolic Panel: Recent Labs  Lab 02/17/22 0231  NA 142  K 3.9  CL 108  CO2 25  GLUCOSE 100*  BUN 18  CREATININE 1.13*  CALCIUM 8.9  MG 2.0  No results found for this or any previous visit (from the past 240 hour(s)).  Antimicrobials: Anti-infectives (From admission, onward)    None      Culture/Microbiology    Component Value Date/Time   SDES BLOOD LEFT HAND 01/12/2022 0828   SDES BLOOD RIGHT HAND 01/12/2022 0828   SPECREQUEST  01/12/2022 0828    BOTTLES DRAWN AEROBIC ONLY Blood Culture results may not be optimal due to an inadequate volume of blood received in culture bottles   SPECREQUEST  01/12/2022 0828    BOTTLES DRAWN AEROBIC ONLY Blood Culture results may not be optimal due to an inadequate volume of blood received in culture bottles   CULT  01/12/2022 0828    NO GROWTH 5 DAYS Performed at [facility], [place] [address], [place], [place] [address]    CULT  01/12/2022 [phone]    NO GROWTH 5 DAYS Performed at [facility], [place] [address] [place], [place] [address]    REPTSTATUS 01/17/2022 FINAL 01/12/2022 6948   REPTSTATUS 01/17/2022 FINAL 01/12/2022 5462  Radiology Studies: No results found.   LOS: 83 days   Alma Friendly, MD [facility]  02/17/2022, 5:08 PM

## 2022-02-17 NOTE — Progress Notes (Signed)
Mobility Specialist - Progress Note   02/17/22 1154  Mobility  Activity Ambulated with assistance in hallway  Level of Assistance Contact guard assist, steadying assist  Assistive Device Other (Comment) (HHA)  Distance Ambulated (ft) 500 ft  Activity Response Tolerated well  Mobility Referral Yes  $Mobility charge 1 Mobility   Pt received in bed and agreeable to mobility. Pt HHA throughout ambulation. Pt was returned to bed with all needs met and bed alarm on.   Larey Seat

## 2022-02-18 NOTE — TOC Progression Note (Cosign Needed)
Transition of Care [facility]) - Progression Note    Patient Details  Name: Natalie Brown MRN: [id number] Date of Birth: 1966/04/13  Transition of Care [facility]) CM/SW [place], [place] Work Phone Number: 02/18/2022, 2:39 PM  Clinical Narrative:    MSW intern contacted SNF facilities in the [place] area. -[facility]: The facility does not have a memory care unit. [name]: Left VM. -[facility]-: MSW intern was redirected to [name] [phone]). Faythe Dingwall advised there was no memory care beds available at [name] or [place]: Left VM -[name]: Spoke with [name], admissions coordinator who stated they have a memory care unit and do accept Medicaid. [name] advised they require the patient to have a healthcare POA or guardian and the patient has to have a diagnosis of dementia and a wandering diagnosis. [name] asked that the referral be faxed over to [phone]. -[name]: MSW intern was not able to leave a message. -[name]: MSW intern was redirected to call [name] at [phone]. The facility accepts Long-term Medicaid only, but does not have a memory care unit.  [name]: MSW intern was redirected to [phone]. MSW intern left VM. -[name]: Left VM  MSW intern and CSW are still waiting to hear back from several other facilities as well.     Barriers to Discharge: Financial Resources, Inadequate or no insurance, Homeless with medical needs  Expected Discharge Plan and Services   In-house Referral: Clinical Social Work   Post Acute Care Choice: Nursing Home Living arrangements for the past 2 months: Homeless [facility]) Expected Discharge Date: 12/24/21                                     Social Determinants of Health (SDOH) Interventions    Readmission Risk Interventions     No data to display

## 2022-02-18 NOTE — Progress Notes (Signed)
Mobility Specialist Progress Note:   02/18/22 0930  Mobility  Activity Ambulated with assistance in hallway  Level of Assistance Contact guard assist, steadying assist  Assistive Device Other (Comment) (HHA)  Distance Ambulated (ft) 200 ft  Activity Response Tolerated well  Mobility Referral Yes  $Mobility charge 1 Mobility   Pt eager for mobility session. Required only HHA for comfort. Pt back in bed with all needs met.   Nelta [facility] Secure Chat or Office Phone: [phone]

## 2022-02-18 NOTE — Plan of Care (Signed)
  Problem: Safety: Goal: Non-violent Restraint(s) Outcome: Progressing   

## 2022-02-18 NOTE — Plan of Care (Signed)
  Problem: Safety: Goal: Non-violent Restraint(s) 02/18/2022 1217 by Birdie Hopes, RN Outcome: Progressing 02/18/2022 1215 by Birdie Hopes, RN Outcome: Progressing

## 2022-02-18 NOTE — Progress Notes (Signed)
PROGRESS NOTE Natalie Brown  ONG:[id number] DOB: [birth year] DOA: 12/24/2021 PCP: Marliss Coots, NP   Brief Narrative/Hospital Course: 56 y.o.f w/ history of bipolar disorder hospitalized at [facility] received Ativan challenge for possible catatonia-with minimal improvement-due to persistent altered mental status/tachycardia patient was admitted to [facility] service 12/24/21. Febrile and Covid 19 + initially 9/23 but no pneumonia.She underwent further work-up with MRI brain 9/4-no acute finding, noted advanced cerebral/cerebellar atrophy, LTM EEG 9/4-9/5, 9/5>9/9,no seizure,TSH stable. CTA chest 9/6 no PE no pneumonia, RPR nonreactive, bilateral LE Doppler negative for DVT. Echo with normal EF 9/19, repeat CT brain unremarkable 9/25.  She has had prolonged hospitalization seen by psychiatry, per psychiatry does not meet criteria to go back to behavioral health, awaiting for placement to memory care   Subjective: Patient refusing to engage in any conversation.      Assessment and Plan: Principal Problem:   Acute encephalopathy Active Problems:   Bipolar affective disorder, current episode manic with psychotic symptoms (HCC)   Tobacco abuse   Dementia with behavioral disturbance (HCC)   Altered mental state     Early onset dementia Memory loss Patient with metabolic encephalopathy Seen by psychiatry, neurology at this time felt that she has underlying early onset dementia, now at baseline.  Extensive work-up negative with multiple labs CT head MRI LTM EEG.  Continue B12 supplement, Haldol 1 mg twice daily.  At time responds, abel to say her name when asked  Bipolar disorder on Haldol and as needed olanzapine seen by psychiatry signed off B12 deficiency continue replacement  Sinus tachycardia, resolved controlled on metoprolol COVID-19 infection 9/23:Resolved Tobacco abuse:Continue nicotine patch   DVT prophylaxis: enoxaparin (LOVENOX) injection 40 mg Start: 12/26/21 1200 Code Status:   Code  Status: Prior Family Communication: None at bedside    Patient status is: Inpatient because of disposition Level of care: Med-Surg  Dispo: The patient is from: home            Anticipated disposition: Memory care unit.  Current activity level 5, walks with assist in room/home   Objective: Vitals last 24 hrs: Vitals:   02/17/22 1638 02/17/22 2048 02/18/22 0742 02/18/22 1200  BP: 137/89 (!) 81/49 107/67 128/72  Pulse: 87 67 97   Resp: 18 18 18    Temp:  98.1 F (36.7 C) 98.8 F (37.1 C) 98.9 F (37.2 C)  TempSrc:  Oral Oral Oral  SpO2: 99% 99% 95% 97%  Height:       Weight change:   Physical Examination: General: NAD  Cardiovascular: S1, S2 present Respiratory: CTAB Abdomen: Soft, nontender, nondistended, bowel sounds present Musculoskeletal: No bilateral pedal edema noted Skin: Normal Psychiatry: Unable to assess   Medications reviewed:  Scheduled Meds:  Continuous Infusions:   Diet Order             Diet regular Room service appropriate? No; Fluid consistency: Thin  Diet effective now                 No intake or output data in the 24 hours ending 02/18/22 1556  Net IO Since Admission: 14,823 mL [02/18/22 1556]  Wt Readings from Last 3 Encounters:  12/24/21 72 kg  09/05/21 72.6 kg  11/20/20 73.9 kg     Unresulted Labs (From admission, onward)     Start     Ordered   01/27/22 4401  Basic metabolic panel  Weekly,   R     Question:  Specimen collection method  Answer:  Lab=Lab collect  01/25/22 1855   01/27/22 0000  CBC  Weekly,   R     Question:  Specimen collection method  Answer:  Lab=Lab collect   01/25/22 1855   01/27/22 0000  Magnesium  Weekly,   R     Question:  Specimen collection method  Answer:  Lab=Lab collect   01/25/22 1855          Data Reviewed: I have personally reviewed following labs and imaging studies CBC: Recent Labs  Lab 02/17/22 0231  WBC 5.2  HGB 12.4  HCT 37.7  MCV 90.2  PLT 304   Basic Metabolic  Panel: Recent Labs  Lab 02/17/22 0231  NA 142  K 3.9  CL 108  CO2 25  GLUCOSE 100*  BUN 18  CREATININE 1.13*  CALCIUM 8.9  MG 2.0  No results found for this or any previous visit (from the past 240 hour(s)).  Antimicrobials: Anti-infectives (From admission, onward)    None      Culture/Microbiology    Component Value Date/Time   SDES BLOOD LEFT HAND 01/12/2022 0828   SDES BLOOD RIGHT HAND 01/12/2022 0828   SPECREQUEST  01/12/2022 0828    BOTTLES DRAWN AEROBIC ONLY Blood Culture results may not be optimal due to an inadequate volume of blood received in culture bottles   SPECREQUEST  01/12/2022 0828    BOTTLES DRAWN AEROBIC ONLY Blood Culture results may not be optimal due to an inadequate volume of blood received in culture bottles   CULT  01/12/2022 0828    NO GROWTH 5 DAYS Performed at [facility], [address], [place], [place] [address]    CULT  01/12/2022 [phone]    NO GROWTH 5 DAYS Performed at [facility], [address], [place], [place] [address]    REPTSTATUS 01/17/2022 FINAL 01/12/2022 [id number]   REPTSTATUS 01/17/2022 FINAL 01/12/2022 7106  Radiology Studies: No results found.   LOS: 54 days   Briant Cedar, MD [facility]  02/18/2022, 3:56 PM

## 2022-02-19 DIAGNOSIS — U071 COVID-19: Secondary | ICD-10-CM | POA: Diagnosis present

## 2022-02-19 DIAGNOSIS — E538 Deficiency of other specified B group vitamins: Secondary | ICD-10-CM | POA: Diagnosis present

## 2022-02-19 DIAGNOSIS — I10 Essential (primary) hypertension: Secondary | ICD-10-CM | POA: Diagnosis present

## 2022-02-19 DIAGNOSIS — K117 Disturbances of salivary secretion: Secondary | ICD-10-CM | POA: Diagnosis not present

## 2022-02-19 NOTE — Plan of Care (Signed)
  Problem: Nutrition: Goal: Adequate nutrition will be maintained Outcome: Progressing   Problem: Coping: Goal: Level of anxiety will decrease Outcome: Progressing   

## 2022-02-19 NOTE — Progress Notes (Addendum)
Progress Note Patient: Natalie Brown ZOX:[id number] DOB: 01/30/1966 DOA: 12/24/2021  DOS: the patient was seen and examined on 02/19/2022  Brief hospital course: 56 y.o.f w/ history of bipolar disorder, HTN hospitalized at [facility], brought into the hospital due to persistent altered mental status/tachycardia for admission to [facility] service.  09/07/2021 admitted to behavioral health.  Treated with Zyprexa Invega and Ativan challenge for catatonia. 12/24/2021 sent to ER from [facility] for evaluation for tachycardia 9/04-9/5, 9/5>9/9, LTM EEG  no seizure. TSH stable CTA chest no PE no pneumonia RPR nonreactive bilateral LE Doppler negative for DVT Echo with normal EF  9/23 febrile secondary to Covid 19 + no pneumonia. 9/25 repeat CT brain unremarkable. She has had prolonged hospitalization seen by psychiatry, per psychiatry does not meet criteria to go back to behavioral health, awaiting for placement to memory care unit. Assessment and Plan: Early onset Dementia with behavioral disturbance ([place]) CT head in June 2023 shows evidence of advanced brain atrophy. MRI brain also shows evidence of advanced cerebral and cerebellar atrophy. In fact that is the only abnormal finding reported on the MRI. Copper level, B1, MMA, TSH unremarkable. Ammonia level was minimally elevated as well as B12 level was minimally low but it would not explain presentation. Psychiatry is with a diagnosis of early onset dementia. Currently not on any therapy.  Delirium due to multiple etiologies, acute, hyperactive Bipolar affective disorder, current episode manic with psychotic symptoms ([place]) Initially admitted to the behavioral health hospital. Treated for psychosis, bipolar disorder, catatonia. Per last psychiatry note on 9/11- CONTINUE Haldol 1 mg every morning and 2 mg nightly; per [facility], plan to taper as tolerated CONTINUE propranolol 10 mg twice daily for anxiety and tachycardia (currently held for hypotension but resume as  tolerated) CONTINUE atropine sublingual drops every 8 hours as needed drooling  Neurology was consulted.  EEG negative for any seizures.  Other metabolic work-up unremarkable.  Recommended to treat delirium.  Patient continues to exhibit intermittent agitation requiring restraints.  Patient is not on any scheduled therapy in the hospital but the patient was on Zyprexa 5 mg nightly, Trileptal 300 mg twice daily at [facility].  Suspect patient will benefit from some scheduled medication.  Sinus tachycardia On Lopressor.  Patient was Coreg and Inderal before. Continue Lopressor.  Tobacco abuse Currently on nicotine patch.  Low-dose. Due to concerns with ongoing confusion I will discontinue nicotine patch and monitor.  B12 deficiency Relative.  MMA level normal.  Currently being replaced.  COVID-19 virus infection Checked in response to fever on 9/22.  No therapy was indicated.  No evidence of pneumonia.  Drooling Treated with atropine sublingual.  Currently on scopolamine patch.  We will discontinue atropine sublingual.  Essential hypertension Blood pressure stable. Monitor.  Subjective: Denies any acute complaint.  Asks me why she is still in the hospital.  Tells me that she is here since September and wants to go home.  Overnight required restraints.  Physical Exam: Vitals:   02/18/22 2040 02/19/22 0600 02/19/22 1032 02/19/22 1529  BP: (!) 124/104 103/86 112/71 111/78  Pulse:   91 90  Resp: 16 17  18   Temp: 98.7 F (37.1 C) 98.6 F (37 C)    TempSrc: Oral Oral  Oral  SpO2: 98% 94%  93%  Height:       General: Appear in mild distress; no visible Abnormal Neck Mass Or lumps, Conjunctiva normal Cardiovascular: S1 and S2 Present, NO Murmur, Respiratory: good respiratory effort, Bilateral Air entry present and  CTA, no Crackles, no wheezes Abdomen: Bowel Sound present, Non tender  Extremities: no Pedal edema Neurology: alert and oriented to place and person, no asterixis Gait not  checked due to patient safety concerns   Data Reviewed: I have Reviewed nursing notes, Vitals, and Lab results since pt's last encounter. Pertinent lab results   I have reviewed the last note from psychiatric,    Family Communication: No one at bedside.  Mother has POA.  Disposition: Status is: Inpatient Remains inpatient appropriate because: Unsafe discharge.  Awaiting memory care transfer.  enoxaparin (LOVENOX) injection 40 mg Start: 12/26/21 1200   Level of care: Med-Surg Author: Berle Mull, MD 02/19/2022 6:06 PM Please look on [email or web address] to find out who is on call.

## 2022-02-19 NOTE — TOC Progression Note (Signed)
Transition of Care [facility]) - Progression Note    Patient Details  Name: Natalie Brown MRN: [id number] Date of Birth: 22-Apr-1966  Transition of Care [facility]) CM/SW [place], LCSW Phone Number: 02/19/2022, 4:32 PM  Clinical Narrative:    CSW requested [place] review referral now that they have beds available. They reported patient is declined due to age.      Barriers to Discharge: Financial Resources, Inadequate or no insurance, Homeless with medical needs  Expected Discharge Plan and Services   In-house Referral: Clinical Social Work   Post Acute Care Choice: Nursing Home Living arrangements for the past 2 months: Homeless [facility]) Expected Discharge Date: 12/24/21                                     Social Determinants of Health (SDOH) Interventions    Readmission Risk Interventions     No data to display

## 2022-02-19 NOTE — Progress Notes (Signed)
Mobility Specialist Progress Note:   02/19/22 0915  Mobility  Activity Ambulated with assistance in hallway  Level of Assistance Contact guard assist, steadying assist  Assistive Device Other (Comment) (HHA)  Distance Ambulated (ft) 500 ft  Activity Response Tolerated well  Mobility Referral Yes  $Mobility charge 1 Mobility   Pt agreeable to mobility session. Required only HHA for comfort. Pt back in bed with all needs met, eating breakfast.   [place] or Office Phone: [phone]

## 2022-02-20 MED ORDER — PROPRANOLOL HCL 10 MG PO TABS
10.0000 mg | ORAL_TABLET | Freq: Two times a day (BID) | ORAL | Status: DC
  Administered 2022-02-20 – 2022-04-12 (×90): 10 mg via ORAL
  Filled 2022-02-20 (×107): qty 1

## 2022-02-20 NOTE — Plan of Care (Signed)

## 2022-02-20 NOTE — TOC Progression Note (Signed)
Transition of Care [facility]) - Progression Note    Patient Details  Name: Natalie Brown MRN: [id number] Date of Birth: 1965-07-18  Transition of Care [facility]) CM/SW [place], LCSW Phone Number: 02/20/2022, 9:32 AM  Clinical Narrative:    CSW received call from [facility] with [facility] in [place]. She stated their staff can come assess patient. They will be in contact with CSW to make arrangements.      Barriers to Discharge: Financial Resources, Inadequate or no insurance, Homeless with medical needs  Expected Discharge Plan and Services   In-house Referral: Clinical Social Work   Post Acute Care Choice: Nursing Home Living arrangements for the past 2 months: Homeless [facility]) Expected Discharge Date: 12/24/21                                     Social Determinants of Health (SDOH) Interventions    Readmission Risk Interventions     No data to display

## 2022-02-20 NOTE — Progress Notes (Signed)
PROGRESS NOTE  Natalie Brown  DOB: 1966-01-24  PCP: Marliss Coots, NP PPI:[id number]  DOA: 12/24/2021  LOS: 73 days  Hospital Day: 59  Brief narrative: Natalie Brown is a 56 y.o. female with PMH significant for bipolar disorder, HTN who was hospitalized at [facility] 9/4, patient was brought into the hospital due to persistent altered mental status/tachycardia for admission to [facility] service.   09/07/2021, patient was admitted to behavioral health.  Treated with Zyprexa Invega and Ativan challenge for catatonia. 12/24/2021 sent to ER from [facility] for evaluation for tachycardia 9/04-9/5, 9/5>9/9, LTM EEG  no seizure. TSH stable CTA chest no PE no pneumonia RPR nonreactive bilateral LE Doppler negative for DVT Echo with normal EF  9/23 febrile secondary to Covid 19 + no pneumonia. 9/25 repeat CT brain unremarkable.  She has had prolonged hospitalization seen by psychiatry. Per psychiatry does not meet criteria to go back to behavioral health, awaiting for placement to memory care unit.  Subjective: Patient was seen and examined this morning.  Middle-aged African-American female.  Sitting up in bed.  Taking her breakfast.  Unable to have a communication.  Not restless or agitated at the time of my evaluation. Chart reviewed In the last 24 hours, no fever, hemodynamically stable.  Assessment and plan: Early onset Dementia with behavioral disturbance  10/09/21, CT head showed evidence of advanced brain atrophy. 9/4, MRI brain also shows evidence of advanced cerebral and cerebellar atrophy. Copper level, B1, MMA, TSH unremarkable. Ammonia level was minimally elevated.  B12 level was minimally low but these minor were abnormalities would not explain presentation.   Delirium due to multiple etiologies, acute, hyperactive Bipolar affective disorder, current episode manic with psychotic symptoms  Initially admitted to the behavioral health hospital. Treated for psychosis, bipolar disorder,  catatonia. Per last psychiatry note on 9/11- CONTINUE Haldol 1 mg every morning and 2 mg nightly; per [facility], plan to taper as tolerated CONTINUE propranolol 10 mg twice daily for anxiety and tachycardia (currently held for hypotension).  11/1, I will switch her from metoprolol to propranolol. CONTINUE atropine sublingual drops every 8 hours as needed drooling Neurology was consulted.  EEG negative for any seizures.  Other metabolic work-up unremarkable.  Mental status seems Cram at the time of my evaluation this morning. Patient is not on any scheduled therapy in the hospital but the patient was on Zyprexa 5 mg nightly, Trileptal 300 mg twice daily at [facility].  Patient may benefit from some scheduled medication.  Defer to psychiatry.   Sinus tachycardia Currently on Lopressor.  We will switch to propanolol as recommended by psychiatry on 9/11.    Vitamin B12 deficiency Relative.  MMA level normal.  Currently being replaced. Recent Labs    12/24/21 1420 12/25/21 0516 02/10/22 0212 02/17/22 0231  MCV  --    < > 90.4 90.2  VITAMINB12 290  --   --   --    < > = values in this interval not displayed.   COVID-19 virus infection Checked in response to fever on 9/22.  No therapy was indicated.  No evidence of pneumonia.   Drooling Currently on scopolamine patch.     Goals of care   Code Status: Prior    Mobility: Encourage ambulation  Skin assessment:     Nutritional status:  Body mass index is 25.62 kg/m.          Diet:  Diet Order  Diet regular Room service appropriate? No; Fluid consistency: Thin  Diet effective now                   DVT prophylaxis:  enoxaparin (LOVENOX) injection 40 mg Start: 12/26/21 1200   Antimicrobials: None Fluid: None currently Consultants: None Family Communication: None at bedside  Status is: Inpatient  Continue in-hospital care because: Difficult to place Level of care: Med-Surg   Dispo: The patient is from: Home               Anticipated d/c is to: Difficult to place              Patient currently is medically stable to d/c.   Difficult to place patient Yes     Infusions:    Scheduled Meds:  vitamin B-12  1,000 mcg Oral Daily   docusate sodium  200 mg Oral BID   enoxaparin (LOVENOX) injection  40 mg Subcutaneous Q24H   feeding supplement (KATE FARMS STANDARD 1.4)  325 mL Oral BID BM   haloperidol  1 mg Oral BID   hydrocerin   Topical BID   polyethylene glycol  17 g Oral BID   propranolol  10 mg Oral BID   scopolamine  1 patch Transdermal Q72H    PRN meds: acetaminophen, alum & mag hydroxide-simeth, fluticasone, hydrOXYzine, LORazepam, magnesium hydroxide, metoprolol tartrate, OLANZapine zydis **AND** ziprasidone, ondansetron, mouth rinse   Antimicrobials: Anti-infectives (From admission, onward)    None       Objective: Vitals:   02/20/22 0000 02/20/22 1000  BP: 113/74 102/78  Pulse: 67 100  Resp: 16 18  Temp: 98 F (36.7 C) (!) 97.4 F (36.3 C)  SpO2: 98%     Intake/Output Summary (Last 24 hours) at 02/20/2022 1413 Last data filed at 02/19/2022 1739 Gross per 24 hour  Intake 720 ml  Output --  Net 720 ml   There were no vitals filed for this visit. Weight change:  Body mass index is 25.62 kg/m.   Physical Exam: General exam: Not in physical distress Skin: No rashes, lesions or ulcers. HEENT: Atraumatic, normocephalic, no obvious bleeding Lungs: Clear to auscultation bilaterally CVS: Regular rate and rhythm, no murmur GI/Abd soft, nontender, nondistended, bowel sound present CNS: Alert, awake.  Unable to have a conversation with me Psychiatry: Mood appropriate at the time of my evaluation Extremities: No pedal edema, no calf tenderness  Data Review: I have personally reviewed the laboratory data and studies available.  F/u labs ordered Unresulted Labs (From admission, onward)     Start     Ordered   01/27/22 [id number]  Basic metabolic panel  Weekly,   R      Question:  Specimen collection method  Answer:  Lab=Lab collect   01/25/22 1855   01/27/22 0000  CBC  Weekly,   R     Question:  Specimen collection method  Answer:  Lab=Lab collect   01/25/22 1855   01/27/22 0000  Magnesium  Weekly,   R     Question:  Specimen collection method  Answer:  Lab=Lab collect   01/25/22 1855            Signed, Terrilee Croak, MD [facility] 02/20/2022

## 2022-02-20 NOTE — Plan of Care (Signed)
  Problem: Safety: Goal: Non-violent Restraint(s) Outcome: Progressing   

## 2022-02-20 NOTE — Progress Notes (Signed)
Mobility Specialist Progress Note:   02/20/22 0940  Mobility  Activity Ambulated with assistance in hallway  Level of Assistance Contact guard assist, steadying assist  Assistive Device Other (Comment) (HHA)  Distance Ambulated (ft) 500 ft  Activity Response Tolerated well  Mobility Referral Yes  $Mobility charge 1 Mobility   Pt eager for mobility session this am. Only HHA given for comfort. Pt back in bed with all needs met, bed alarm on.   Nelta Numbers Acute Rehab Secure Chat or Office Phone: [phone]

## 2022-02-21 NOTE — Progress Notes (Signed)
Mobility Specialist Progress Note:   02/21/22 0950  Mobility  Activity Ambulated with assistance in hallway  Level of Assistance Contact guard assist, steadying assist  Assistive Device Other (Comment) (HHA)  Distance Ambulated (ft) 500 ft  Activity Response Tolerated well  Mobility Referral Yes  $Mobility charge 1 Mobility   Pt eager for mobility session. Required only HHA for comfort. Pt engaging in conversation this am, back in bed with all needs met.   Natalie Brown Acute Rehab Secure Chat or Office Phone: [phone]

## 2022-02-21 NOTE — Progress Notes (Signed)
PROGRESS NOTE  Natalie Brown  DOB: [birth year]  PCP: Marliss Coots, NP ZES:[id number]  DOA: 12/24/2021  LOS: 18 days  Hospital Day: 57  Brief narrative: Natalie Brown is a 56 y.o. female with PMH significant for bipolar disorder, HTN who was hospitalized at [facility] 9/4, patient was brought into the hospital due to persistent altered mental status/tachycardia for admission to [facility] service.   09/07/2021, patient was admitted to behavioral health.  Treated with Zyprexa Invega and Ativan challenge for catatonia. 12/24/2021 sent to ER from [facility] for evaluation for tachycardia 9/04-9/5, 9/5>9/9, LTM EEG  no seizure. TSH stable CTA chest no PE no pneumonia RPR nonreactive bilateral LE Doppler negative for DVT Echo with normal EF  9/23 febrile secondary to Covid 19 + no pneumonia. 9/25 repeat CT brain unremarkable.  She has had prolonged hospitalization seen by psychiatry. Per psychiatry does not meet criteria to go back to behavioral health, awaiting for placement to memory care unit.  Subjective: Patient was seen and examined this morning.   Sitting up in bed.  Not in distress.  Unable to interact.  Assessment and plan: Early onset Dementia with behavioral disturbance  10/09/21, CT head showed evidence of advanced brain atrophy. 9/4, MRI brain also shows evidence of advanced cerebral and cerebellar atrophy. Copper level, B1, MMA, TSH unremarkable. Ammonia level was minimally elevated.  B12 level was minimally low but these minor were abnormalities would not explain presentation.   Delirium due to multiple etiologies, acute, hyperactive Bipolar affective disorder, current episode manic with psychotic symptoms  Initially admitted to the behavioral health hospital. Treated for psychosis, bipolar disorder, catatonia. Per last psychiatry note on 9/11- CONTINUE Haldol 1 mg every morning and 2 mg nightly; per [facility], plan to taper as tolerated CONTINUE propranolol 10 mg twice daily for anxiety and  tachycardia (currently held for hypotension).  11/1, I will switch her from metoprolol to propranolol. CONTINUE atropine sublingual drops every 8 hours as needed drooling Neurology was consulted.  EEG negative for any seizures.  Other metabolic work-up unremarkable.  Mental status seems Cram at the time of my evaluation this morning. Patient is not on any scheduled therapy in the hospital but the patient was on Zyprexa 5 mg nightly, Trileptal 300 mg twice daily at [facility].  Patient may benefit from some scheduled medication.  Defer to psychiatry.   Sinus tachycardia Currently on Lopressor.  We will switch to propanolol as recommended by psychiatry on 9/11.    Vitamin B12 deficiency Relative.  MMA level normal.  Currently being replaced. Recent Labs    12/24/21 1420 12/25/21 0516 02/10/22 0212 02/17/22 0231  MCV  --    < > 90.4 90.2  VITAMINB12 290  --   --   --    < > = values in this interval not displayed.    COVID-19 virus infection Checked in response to fever on 9/22.  No therapy was indicated.  No evidence of pneumonia.   Drooling Currently on scopolamine patch.     Goals of care   Code Status: Prior    Mobility: Encourage ambulation  Skin assessment:     Nutritional status:  Body mass index is 25.62 kg/m.          Diet:  Diet Order             Diet regular Room service appropriate? No; Fluid consistency: Thin  Diet effective now  DVT prophylaxis:  enoxaparin (LOVENOX) injection 40 mg Start: 12/26/21 1200   Antimicrobials: None Fluid: None currently Consultants: None Family Communication: None at bedside  Status is: Inpatient  Continue in-hospital care because: Difficult to place Level of care: Med-Surg   Dispo: The patient is from: Home              Anticipated d/c is to: Difficult to place              Patient currently is medically stable to d/c.   Difficult to place patient Yes     Infusions:    Scheduled Meds:   vitamin B-12  1,000 mcg Oral Daily   docusate sodium  200 mg Oral BID   enoxaparin (LOVENOX) injection  40 mg Subcutaneous Q24H   feeding supplement (KATE FARMS STANDARD 1.4)  325 mL Oral BID BM   haloperidol  1 mg Oral BID   hydrocerin   Topical BID   polyethylene glycol  17 g Oral BID   propranolol  10 mg Oral BID   scopolamine  1 patch Transdermal Q72H    PRN meds: acetaminophen, alum & mag hydroxide-simeth, fluticasone, hydrOXYzine, LORazepam, magnesium hydroxide, metoprolol tartrate, OLANZapine zydis **AND** ziprasidone, ondansetron, mouth rinse   Antimicrobials: Anti-infectives (From admission, onward)    None       Objective: Vitals:   02/21/22 0843 02/21/22 1220  BP:  118/89  Pulse:  83  Resp: 16 17  Temp: 97.7 F (36.5 C) 97.9 F (36.6 C)  SpO2:     No intake or output data in the 24 hours ending 02/21/22 1502  There were no vitals filed for this visit. Weight change:  Body mass index is 25.62 kg/m.   Physical Exam: General exam: Not in physical distress Skin: No rashes, lesions or ulcers. HEENT: Atraumatic, normocephalic, no obvious bleeding Lungs: Clear to auscultation bilaterally CVS: Regular rate and rhythm, no murmur GI/Abd soft, nontender, nondistended, bowel sound present CNS: Alert, awake.  Unable to have a conversation with me Psychiatry: Mood appropriate at the time of my evaluation Extremities: No pedal edema, no calf tenderness  Data Review: I have personally reviewed the laboratory data and studies available.  F/u labs ordered Unresulted Labs (From admission, onward)     Start     Ordered   01/27/22 [id number]  Basic metabolic panel  Weekly,   R     Question:  Specimen collection method  Answer:  Lab=Lab collect   01/25/22 1855   01/27/22 0000  CBC  Weekly,   R     Question:  Specimen collection method  Answer:  Lab=Lab collect   01/25/22 1855   01/27/22 0000  Magnesium  Weekly,   R     Question:  Specimen collection method  Answer:   Lab=Lab collect   01/25/22 1855            Signed, Terrilee Croak, MD [facility] 02/21/2022

## 2022-02-22 NOTE — Progress Notes (Signed)
PROGRESS NOTE  Natalie Brown  DOB: 1966/03/28  PCP: Lavinia Sharps, NP VOZ:[id number]  DOA: 12/24/2021  LOS: 58 days  Hospital Day: 61  Brief narrative: Natalie Brown is a 56 y.o. female with PMH significant for bipolar disorder, HTN who was hospitalized at [facility] 9/4, patient was brought into the hospital due to persistent altered mental status/tachycardia for admission to [facility] service.   09/07/2021, patient was admitted to behavioral health.  Treated with Zyprexa Invega and Ativan challenge for catatonia. 12/24/2021 sent to ER from [facility] for evaluation for tachycardia 9/04-9/5, 9/5>9/9, LTM EEG  no seizure. TSH stable CTA chest no PE no pneumonia RPR nonreactive bilateral LE Doppler negative for DVT Echo with normal EF  9/23 febrile secondary to Covid 19 + no pneumonia. 9/25 repeat CT brain unremarkable.  She has had prolonged hospitalization seen by psychiatry. Per psychiatry does not meet criteria to go back to behavioral health, awaiting for placement to memory care unit.  Subjective: Patient was seen and examined this morning.  Not in distress.  No new symptoms.  No change in last 24 hours.  See case manager's note.  Assessment and plan: Early onset Dementia with behavioral disturbance  10/09/21, CT head showed evidence of advanced brain atrophy. 9/4, MRI brain also shows evidence of advanced cerebral and cerebellar atrophy. Copper level, B1, MMA, TSH unremarkable. Ammonia level was minimally elevated.  B12 level was minimally low but these minor were abnormalities would not explain presentation.   Delirium due to multiple etiologies, acute, hyperactive Bipolar affective disorder, current episode manic with psychotic symptoms  Initially admitted to the behavioral health hospital. Treated for psychosis, bipolar disorder, catatonia. Per last psychiatry note on 9/11- CONTINUE Haldol 1 mg every morning and 2 mg nightly; per [facility], plan to taper as tolerated CONTINUE propranolol 10  mg twice daily for anxiety and tachycardia (currently held for hypotension).  11/1, I will switch her from metoprolol to propranolol. CONTINUE atropine sublingual drops every 8 hours as needed drooling Neurology was consulted.  EEG negative for any seizures.  Other metabolic work-up unremarkable.  Mental status seems Cram at the time of my evaluation this morning. Patient is not on any scheduled therapy in the hospital but the patient was on Zyprexa 5 mg nightly, Trileptal 300 mg twice daily at [facility].  Patient may benefit from some scheduled medication.  Defer to psychiatry.   Sinus tachycardia Currently on Lopressor.  We will switch to propanolol as recommended by psychiatry on 9/11.    Vitamin B12 deficiency Relative.  MMA level normal.  Currently being replaced. Recent Labs    12/24/21 1420 12/25/21 0516 02/10/22 0212 02/17/22 0231  MCV  --    < > 90.4 90.2  VITAMINB12 290  --   --   --    < > = values in this interval not displayed.    COVID-19 virus infection Checked in response to fever on 9/22.  No therapy was indicated.  No evidence of pneumonia.   Drooling Currently on scopolamine patch.     Goals of care   Code Status: Prior    Mobility: Encourage ambulation  Skin assessment:     Nutritional status:  Body mass index is 25.62 kg/m.          Diet:  Diet Order             Diet regular Room service appropriate? No; Fluid consistency: Thin  Diet effective now  DVT prophylaxis:  enoxaparin (LOVENOX) injection 40 mg Start: 12/26/21 1200   Antimicrobials: None Fluid: None currently Consultants: None Family Communication: None at bedside  Status is: Inpatient  Continue in-hospital care because: Difficult to place Level of care: Med-Surg   Dispo: The patient is from: Home              Anticipated d/c is to: Difficult to place              Patient currently is medically stable to d/c.   Difficult to place patient  Yes     Infusions:    Scheduled Meds:  vitamin B-12  1,000 mcg Oral Daily   docusate sodium  200 mg Oral BID   enoxaparin (LOVENOX) injection  40 mg Subcutaneous Q24H   feeding supplement (KATE FARMS STANDARD 1.4)  325 mL Oral BID BM   haloperidol  1 mg Oral BID   hydrocerin   Topical BID   polyethylene glycol  17 g Oral BID   propranolol  10 mg Oral BID   scopolamine  1 patch Transdermal Q72H    PRN meds: acetaminophen, alum & mag hydroxide-simeth, fluticasone, hydrOXYzine, LORazepam, magnesium hydroxide, metoprolol tartrate, OLANZapine zydis **AND** ziprasidone, ondansetron, mouth rinse   Antimicrobials: Anti-infectives (From admission, onward)    None       Objective: Vitals:   02/21/22 2328 02/22/22 0732  BP: 104/69 (!) 102/56  Pulse: 88 86  Resp: 17   Temp: 98 F (36.7 C) 98.3 F (36.8 C)  SpO2:      Intake/Output Summary (Last 24 hours) at 02/22/2022 1558 Last data filed at 02/22/2022 0900 Gross per 24 hour  Intake 240 ml  Output --  Net 240 ml    There were no vitals filed for this visit. Weight change:  Body mass index is 25.62 kg/m.   Physical Exam: General exam: Not in physical distress Skin: No rashes, lesions or ulcers. HEENT: Atraumatic, normocephalic, no obvious bleeding Lungs: Clear to auscultation bilaterally CVS: Regular rate and rhythm, no murmur GI/Abd soft, nontender, nondistended, bowel sound present CNS: Alert, awake.  Unable to have a conversation with me Psychiatry: Mood appropriate at the time of my evaluation Extremities: No pedal edema, no calf tenderness  Data Review: I have personally reviewed the laboratory data and studies available.  F/u labs ordered Unresulted Labs (From admission, onward)     Start     Ordered   01/27/22 6301  Basic metabolic panel  Weekly,   R     Question:  Specimen collection method  Answer:  Lab=Lab collect   01/25/22 1855   01/27/22 0000  CBC  Weekly,   R     Question:  Specimen  collection method  Answer:  Lab=Lab collect   01/25/22 1855   01/27/22 0000  Magnesium  Weekly,   R     Question:  Specimen collection method  Answer:  Lab=Lab collect   01/25/22 1855            Signed, Terrilee Croak, MD [facility] 02/22/2022
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
## 2022-02-22 NOTE — TOC Progression Note (Signed)
Transition of Care [facility]) - Progression Note    Patient Details  Name: Natalie Brown MRN: [id number] Date of Birth: 07/09/1965  Transition of Care [facility]) CM/SW [place], LCSW Phone Number: 02/22/2022, 3:17 PM  Clinical Narrative:    CSW spoke with Otila Kluver at [facility] to ask for an update on when they can come assess patient. She stated she will call their [place] building since they are closer to see when they can come.   CSW spoke with patient's mother and boyfriend at bedside. Merry Proud asked CSW if there was someone that could review patient's medications while at [facility] because family believes something they gave her has caused her to be in her current mental state with drooling. CSW reached out to patient experience to see if that is something that is offered.      Barriers to Discharge: Financial Resources, Inadequate or no insurance, Homeless with medical needs  Expected Discharge Plan and Services   In-house Referral: Clinical Social Work   Post Acute Care Choice: Nursing Home Living arrangements for the past 2 months: Homeless [facility]) Expected Discharge Date: 12/24/21                                     Social Determinants of Health (SDOH) Interventions    Readmission Risk Interventions     No data to display

## 2022-02-22 NOTE — Progress Notes (Signed)
Mobility Specialist Progress Note:   02/22/22 0920  Mobility  Activity Ambulated with assistance in hallway  Level of Assistance Contact guard assist, steadying assist  Assistive Device Other (Comment) (HHA)  Distance Ambulated (ft) 500 ft  Activity Response Tolerated well  Mobility Referral Yes  $Mobility charge 1 Mobility   Pt eager for mobility session. Asx throughout. Pt back in bed with all needs met.   Nelta Numbers Acute Rehab Secure Chat or Office Phone: [phone]

## 2022-02-23 NOTE — Progress Notes (Signed)
Mobility Specialist Progress Note:   02/23/22 1234  Mobility  Activity Ambulated with assistance in hallway  Level of Assistance Contact guard assist, steadying assist  Assistive Device Other (Comment) (HHA)  Distance Ambulated (ft) 200 ft  Activity Response Tolerated well  Mobility Referral Yes  $Mobility charge 1 Mobility   Pt received ambulating in hallway and agreeable. Pt asymptomatic throughout. Pt left in bed with all needs met and RN in room.   Danyelle Brookover Mobility Specialist-Acute Rehab Secure Chat only

## 2022-02-23 NOTE — Progress Notes (Signed)
Due to delirium precautions pt was not awaken for medications. Pt finally woke at [facility]

## 2022-02-23 NOTE — Progress Notes (Signed)
PROGRESS NOTE  Natalie Brown  DOB: [birth year]  PCP: Lavinia Sharps, NP UVO:[id number]  DOA: 12/24/2021  LOS: 59 days  Hospital Day: 62  Brief narrative: Natalie Brown is a 57 y.o. female with PMH significant for bipolar disorder, HTN who was hospitalized at [facility] 9/4, patient was brought into the hospital due to persistent altered mental status/tachycardia for admission to [facility] service.   09/07/2021, patient was admitted to behavioral health.  Treated with Zyprexa Invega and Ativan challenge for catatonia. 12/24/2021 sent to ER from [facility] for evaluation for tachycardia 9/04-9/5, 9/5>9/9, LTM EEG  no seizure. TSH stable CTA chest no PE no pneumonia RPR nonreactive bilateral LE Doppler negative for DVT Echo with normal EF  9/23 febrile secondary to Covid 19 + no pneumonia. 9/25 repeat CT brain unremarkable.  She has had prolonged hospitalization seen by psychiatry. Per psychiatry does not meet criteria to go back to behavioral health, awaiting for placement to memory care unit.  Subjective:  Patient in bed, appears comfortable, denies any headache, no fever, no chest pain or pressure, no shortness of breath , no abdominal pain. No new focal weakness.   Assessment and plan:   Early onset Dementia with behavioral disturbance  10/09/21, CT head showed evidence of advanced brain atrophy. 9/4, MRI brain also shows evidence of advanced cerebral and cerebellar atrophy. Copper level, B1, MMA, TSH unremarkable. Ammonia level was minimally elevated.  B12 level was minimally low but these minor were abnormalities would not explain presentation.   Delirium due to multiple etiologies, acute, hyperactive Bipolar affective disorder, current episode manic with psychotic symptoms  Initially admitted to the behavioral health hospital. Treated for psychosis, bipolar disorder, catatonia. Per last psychiatry note on 9/11- CONTINUE Haldol 1 mg every morning and 2 mg nightly; per [facility], plan to taper as  tolerated CONTINUE propranolol 10 mg twice daily for anxiety and tachycardia (currently held for hypotension).  11/1, I will switch her from metoprolol to propranolol. CONTINUE atropine sublingual drops every 8 hours as needed drooling Neurology was consulted.  EEG negative for any seizures.  Other metabolic work-up unremarkable.  Mental status seems Cram at the time of my evaluation this morning. Patient is not on any scheduled therapy in the hospital but the patient was on Zyprexa 5 mg nightly, Trileptal 300 mg twice daily at [facility].  Patient may benefit from some scheduled medication.  Defer to psychiatry.   Sinus tachycardia Currently on Lopressor.  We will switch to propanolol as recommended by psychiatry on 9/11.    Vitamin B12 deficiency Relative.  MMA level normal.  Currently being replaced. Recent Labs    12/24/21 1420 12/25/21 0516 02/10/22 0212 02/17/22 0231  MCV  --    < > 90.4 90.2  VITAMINB12 290  --   --   --    < > = values in this interval not displayed.   COVID-19 virus infection Checked in response to fever on 9/22.  No therapy was indicated.  No evidence of pneumonia.   Drooling Currently on scopolamine patch.     Goals of care   Code Status: Prior    Mobility: Encourage ambulation  Skin assessment:     Nutritional status:  Body mass index is 25.62 kg/m.        Diet:  Diet Order             Diet regular Room service appropriate? No; Fluid consistency: Thin  Diet effective now  DVT prophylaxis:  enoxaparin (LOVENOX) injection 40 mg Start: 12/26/21 1200   Antimicrobials: None Fluid: None currently Consultants: None Family Communication: None at bedside  Status is: Inpatient  Continue in-hospital care because: Difficult to place Level of care: Med-Surg   Dispo: The patient is from: Home              Anticipated d/c is to: Difficult to place              Patient currently is medically stable to d/c.   Difficult to  place patient Yes     Infusions:    Scheduled Meds:  vitamin B-12  1,000 mcg Oral Daily   docusate sodium  200 mg Oral BID   enoxaparin (LOVENOX) injection  40 mg Subcutaneous Q24H   feeding supplement (KATE FARMS STANDARD 1.4)  325 mL Oral BID BM   haloperidol  1 mg Oral BID   hydrocerin   Topical BID   polyethylene glycol  17 g Oral BID   propranolol  10 mg Oral BID   scopolamine  1 patch Transdermal Q72H    PRN meds: acetaminophen, alum & mag hydroxide-simeth, fluticasone, hydrOXYzine, LORazepam, magnesium hydroxide, metoprolol tartrate, OLANZapine zydis **AND** ziprasidone, ondansetron, mouth rinse   Antimicrobials: Anti-infectives (From admission, onward)    None       Objective: Vitals:   02/22/22 0732 02/23/22 0924  BP: (!) 102/56 (!) 113/98  Pulse: 86 94  Resp:  17  Temp: 98.3 F (36.8 C) 97.8 F (36.6 C)  SpO2:  95%    Intake/Output Summary (Last 24 hours) at 02/23/2022 1220 Last data filed at 02/22/2022 1236 Gross per 24 hour  Intake 240 ml  Output --  Net 240 ml   There were no vitals filed for this visit. Weight change:  Body mass index is 25.62 kg/m.   Physical Exam:  Awake, remains confused, reading magazines, no focal deficits [place].AT,PERRAL Supple Neck, No JVD,   Symmetrical Chest wall movement, Good air movement bilaterally, CTAB RRR,No Gallops, Rubs or new Murmurs,  +ve B.Sounds, Abd Soft, No tenderness,   No Cyanosis, Clubbing or edema     Data Review: I have personally reviewed the laboratory data and studies available.  Recent Labs  Lab 02/17/22 0231  WBC 5.2  HGB 12.4  HCT 37.7  PLT 304  MCV 90.2  MCH 29.7  MCHC 32.9  RDW 13.2    Recent Labs  Lab 02/17/22 0231  NA 142  K 3.9  CL 108  CO2 25  GLUCOSE 100*  BUN 18  CREATININE 1.13*  CALCIUM 8.9  MG 2.0    Signature  Lala Lund M.D on 02/23/2022 at 12:20 PM   -  To page go to [email or web address]

## 2022-02-24 NOTE — Progress Notes (Signed)
PROGRESS NOTE  Natalie Brown  DOB: [birth year]  PCP: Marliss Coots, NP QMG:[id number]  DOA: 12/24/2021  LOS: 67 days  Hospital Day: 63  Brief narrative: Natalie Brown is a 56 y.o. female with PMH significant for bipolar disorder, HTN who was hospitalized at [facility] 9/4, patient was brought into the hospital due to persistent altered mental status/tachycardia for admission to [facility] service.   09/07/2021, patient was admitted to behavioral health.  Treated with Zyprexa Invega and Ativan challenge for catatonia. 12/24/2021 sent to ER from [facility] for evaluation for tachycardia 9/04-9/5, 9/5>9/9, LTM EEG  no seizure. TSH stable CTA chest no PE no pneumonia RPR nonreactive bilateral LE Doppler negative for DVT Echo with normal EF  9/23 febrile secondary to Covid 19 + no pneumonia. 9/25 repeat CT brain unremarkable.  She has had prolonged hospitalization seen by psychiatry. Per psychiatry does not meet criteria to go back to behavioral health, awaiting for placement to memory care unit.  Subjective:  Patient in bed, overall confused but appears comfortable, denies any headache, no fever, no chest pain or pressure, no shortness of breath , no abdominal pain. No focal weakness.   Assessment and plan:   Early onset Dementia with behavioral disturbance  10/09/21, CT head showed evidence of advanced brain atrophy. 9/4, MRI brain also shows evidence of advanced cerebral and cerebellar atrophy. Copper level, B1, MMA, TSH unremarkable. Ammonia level was minimally elevated.  B12 level was minimally low but these minor were abnormalities would not explain presentation.   Delirium due to multiple etiologies, acute, hyperactive Bipolar affective disorder, current episode manic with psychotic symptoms  Initially admitted to the behavioral health hospital. Treated for psychosis, bipolar disorder, catatonia. Per last psychiatry note on 9/11- CONTINUE Haldol 1 mg every morning and 2 mg nightly; per [facility],  plan to taper as tolerated CONTINUE propranolol 10 mg twice daily for anxiety and tachycardia (currently held for hypotension).  11/1, I will switch her from metoprolol to propranolol. CONTINUE atropine sublingual drops every 8 hours as needed drooling Neurology was consulted.  EEG negative for any seizures.  Other metabolic work-up unremarkable.  Mental status seems Cram at the time of my evaluation this morning. Patient is not on any scheduled therapy in the hospital but the patient was on Zyprexa 5 mg nightly, Trileptal 300 mg twice daily at [facility].  Patient may benefit from some scheduled medication.  Defer to psychiatry.   Sinus tachycardia Currently on Lopressor.  We will switch to propanolol as recommended by psychiatry on 9/11.    Vitamin B12 deficiency Relative.  MMA level normal.  Currently being replaced. Recent Labs    12/24/21 1420 12/25/21 0516 02/10/22 0212 02/17/22 0231  MCV  --    < > 90.4 90.2  VITAMINB12 290  --   --   --    < > = values in this interval not displayed.   COVID-19 virus infection Checked in response to fever on 9/22.  No therapy was indicated.  No evidence of pneumonia.   Drooling Currently on scopolamine patch.     Goals of care   Code Status: Prior    Mobility: Encourage ambulation  Skin assessment:     Nutritional status:  Body mass index is 25.62 kg/m.        Diet:  Diet Order             Diet regular Room service appropriate? No; Fluid consistency: Thin  Diet effective now  DVT prophylaxis:  enoxaparin (LOVENOX) injection 40 mg Start: 12/26/21 1200   Antimicrobials: None Fluid: None currently Consultants: None Family Communication: None at bedside  Status is: Inpatient  Continue in-hospital care because: Difficult to place Level of care: Med-Surg   Dispo: The patient is from: Home              Anticipated d/c is to: Difficult to place              Patient currently is medically stable to  d/c.   Difficult to place patient Yes   Scheduled Meds:  vitamin B-12  1,000 mcg Oral Daily   docusate sodium  200 mg Oral BID   enoxaparin (LOVENOX) injection  40 mg Subcutaneous Q24H   feeding supplement (KATE FARMS STANDARD 1.4)  325 mL Oral BID BM   haloperidol  1 mg Oral BID   hydrocerin   Topical BID   polyethylene glycol  17 g Oral BID   propranolol  10 mg Oral BID   scopolamine  1 patch Transdermal Q72H    PRN meds: acetaminophen, alum & mag hydroxide-simeth, fluticasone, hydrOXYzine, LORazepam, magnesium hydroxide, metoprolol tartrate, OLANZapine zydis **AND** ziprasidone, ondansetron, mouth rinse   Antimicrobials: Anti-infectives (From admission, onward)    None       Objective: Vitals:   02/24/22 0638 02/24/22 0805  BP: 105/60 106/73  Pulse: 82 95  Resp: 16 18  Temp: 98.2 F (36.8 C) 97.6 F (36.4 C)  SpO2: 97% 98%    Intake/Output Summary (Last 24 hours) at 02/24/2022 0930 Last data filed at 02/24/2022 0500 Gross per 24 hour  Intake 400 ml  Output 3 ml  Net 397 ml   There were no vitals filed for this visit. Weight change:  Body mass index is 25.62 kg/m.   Physical Exam:  Awake, remains confused, reading magazines, no focal deficits [place].AT,PERRAL Supple Neck, No JVD,   Symmetrical Chest wall movement, Good air movement bilaterally, CTAB RRR,No Gallops, Rubs or new Murmurs,  +ve B.Sounds, Abd Soft, No tenderness,   No Cyanosis, Clubbing or edema     Data Review: I have personally reviewed the laboratory data and studies available.  No results for input(s): "WBC", "HGB", "HCT", "PLT", "MCV", "MCH", "MCHC", "RDW", "LYMPHSABS", "MONOABS", "EOSABS", "BASOSABS", "BANDABS" in the last 168 hours.  Invalid input(s): "NEUTRABS", "BANDSABD"   No results for input(s): "NA", "K", "CL", "CO2", "GLUCOSE", "BUN", "CREATININE", "CALCIUM", "AST", "ALT", "ALKPHOS", "BILITOT", "ALBUMIN", "MG", "PHOS", "CRP", "DDIMER", "PROCALCITON", "LATICACIDVEN", "INR",  "TSH", "CORTISOL", "HGBA1C", "AMMONIA", "BNP" in the last 168 hours.  Invalid input(s): "GFRCGP", "ARSCOV2NAA"   Signature  Susa Raring M.D on 02/24/2022 at 9:30 AM   -  To page go to [email or web address]

## 2022-02-25 LAB — BASIC METABOLIC PANEL
Anion gap: 10 (ref 5–15)
BUN: 12 mg/dL (ref 6–20)
CO2: 24 mmol/L (ref 22–32)
Calcium: 8.9 mg/dL (ref 8.9–10.3)
Chloride: 105 mmol/L (ref 98–111)
Creatinine, Ser: 1.17 mg/dL — ABNORMAL HIGH (ref 0.44–1.00)
GFR, Estimated: 55 mL/min — ABNORMAL LOW (ref >60)
Glucose, Bld: 88 mg/dL (ref 70–99)
Potassium: 4.1 mmol/L (ref 3.5–5.1)
Sodium: 139 mmol/L (ref 135–145)

## 2022-02-25 LAB — CBC
HCT: 40.6 % (ref 36.0–46.0)
Hemoglobin: 13.1 g/dL (ref 12.0–15.0)
MCH: 29.2 pg (ref 26.0–34.0)
MCHC: 32.3 g/dL (ref 30.0–36.0)
MCV: 90.6 fL (ref 80.0–100.0)
Platelets: 264 10*3/uL (ref 150–400)
RBC: 4.48 MIL/uL (ref 3.87–5.11)
RDW: 12.9 % (ref 11.5–15.5)
WBC: 4 10*3/uL (ref 4.0–10.5)
nRBC: 0 % (ref 0.0–0.2)

## 2022-02-25 LAB — MAGNESIUM: Magnesium: 2.1 mg/dL (ref 1.7–2.4)

## 2022-02-25 NOTE — TOC Progression Note (Signed)
Transition of Care [facility]) - Progression Note    Patient Details  Name: Natalie Brown MRN: [id number] Date of Birth: 1966-01-25  Transition of Care [facility]) CM/SW [place], [place] Phone Number: 02/25/2022, 12:24 PM  Clinical Narrative:    CSW met with Marval Regal [phone]) and she assessed patient for [facility]. She stated that patient appears appropriate and will have Otila Kluver call CSW for further instructions. CSW updated patient's mother.        Barriers to Discharge: Financial Resources, Inadequate or no insurance, Homeless with medical needs  Expected Discharge Plan and Services   In-house Referral: Clinical Social Work   Post Acute Care Choice: Nursing Home Living arrangements for the past 2 months: Homeless [facility]) Expected Discharge Date: 12/24/21                                     Social Determinants of Health (SDOH) Interventions    Readmission Risk Interventions     No data to display

## 2022-02-25 NOTE — Progress Notes (Signed)
PROGRESS NOTE  Natalie Brown  DOB: Aug 23, 1965  PCP: Marliss Coots, NP JKK:[id number]  DOA: 12/24/2021  LOS: 41 days  Hospital Day: 64  Brief narrative: Natalie Brown is a 56 y.o. female with PMH significant for bipolar disorder, HTN who was hospitalized at [facility] 9/4, patient was brought into the hospital due to persistent altered mental status/tachycardia for admission to [facility] service.   09/07/2021, patient was admitted to behavioral health.  Treated with Zyprexa Invega and Ativan challenge for catatonia. 12/24/2021 sent to ER from [facility] for evaluation for tachycardia 9/04-9/5, 9/5>9/9, LTM EEG  no seizure. TSH stable CTA chest no PE no pneumonia RPR nonreactive bilateral LE Doppler negative for DVT Echo with normal EF  9/23 febrile secondary to Covid 19 + no pneumonia. 9/25 repeat CT brain unremarkable.  She has had prolonged hospitalization seen by psychiatry. Per psychiatry does not meet criteria to go back to behavioral health, awaiting for placement to memory care unit.  Subjective:  Patient in bed, appears comfortable, denies any headache, no fever, no chest pain or pressure, no shortness of breath , no abdominal pain. No new focal weakness.  Assessment and plan:   Early onset Dementia with behavioral disturbance  - 10/09/21, CT head showed evidence of advanced brain atrophy.  12/24/21, MRI brain also shows evidence of advanced cerebral and cerebellar atrophy. Copper level, B1, MMA, TSH unremarkable.  Ammonia level was minimally elevated.  B12 level was minimally low but these minor were abnormalities would not explain presentation.  [name] now likely at her baseline, poor functional status with poor insight and cognition.   Delirium due to multiple etiologies, acute, hyperactive, Bipolar affective disorder, current episode manic with psychotic symptoms  Initially admitted to the behavioral health hospital. Treated for psychosis, bipolar disorder, catatonia. Per last psychiatry note  on 12/31/21 -  CONTINUE Haldol 1 mg every morning and 2 mg nightly; per [facility], plan to taper as tolerated CONTINUE propranolol 10 mg twice daily for anxiety and tachycardia (currently held for hypotension).  11/1, I will switch her from metoprolol to propranolol. CONTINUE atropine sublingual drops every 8 hours as needed drooling Neurology was consulted.  EEG negative for any seizures.  Other metabolic work-up unremarkable.  Mental status seems Cram at the time of my evaluation this morning. Patient is not on any scheduled therapy in the hospital but the patient was on Zyprexa 5 mg nightly, Trileptal 300 mg twice daily at [facility].  Patient may benefit from some scheduled medication.  Defer to psychiatry.    Sinus tachycardia - Currently on Lopressor.  We will switch to propanolol as recommended by psychiatry on 9/11.    Vitamin B12 deficiency - Relative.  MMA level normal.  Currently being replaced.  Drooling - Currently on scopolamine patch.    COVID-19 virus infection - Checked in response to fever on 9/22.  No therapy was indicated.  No evidence of pneumonia.  This problem has resolved.     Skin assessment:     Nutritional status:  Body mass index is 25.62 kg/m.        Diet:  Diet Order             Diet regular Room service appropriate? No; Fluid consistency: Thin  Diet effective now                   DVT prophylaxis:  enoxaparin (LOVENOX) injection 40 mg Start: 12/26/21 1200   Antimicrobials: None Fluid: None currently Consultants: None  Family Communication: None at bedside  Status is: Inpatient  Continue in-hospital care because: Difficult to place Level of care: Med-Surg   Dispo: The patient is from: Home              Anticipated d/c is to: Difficult to place              Patient currently is medically stable to d/c.   Difficult to place patient Yes   Scheduled Meds:  vitamin B-12  1,000 mcg Oral Daily   docusate sodium  200 mg Oral BID   enoxaparin  (LOVENOX) injection  40 mg Subcutaneous Q24H   feeding supplement (KATE FARMS STANDARD 1.4)  325 mL Oral BID BM   haloperidol  1 mg Oral BID   hydrocerin   Topical BID   polyethylene glycol  17 g Oral BID   propranolol  10 mg Oral BID   scopolamine  1 patch Transdermal Q72H    PRN meds: acetaminophen, alum & mag hydroxide-simeth, fluticasone, hydrOXYzine, LORazepam, magnesium hydroxide, metoprolol tartrate, OLANZapine zydis **AND** ziprasidone, ondansetron, mouth rinse   Antimicrobials: Anti-infectives (From admission, onward)    None       Objective: Vitals:   02/25/22 0832 02/25/22 1036  BP: 106/73   Pulse: 81   Resp: 17 18  Temp: (!) 97.4 F (36.3 C)   SpO2: 96%    No intake or output data in the 24 hours ending 02/25/22 1043  There were no vitals filed for this visit. Weight change:  Body mass index is 25.62 kg/m.   Physical Exam:  Awake, remains confused, reading magazines, no focal deficits [place].AT,PERRAL Supple Neck, No JVD,   Symmetrical Chest wall movement, Good air movement bilaterally, CTAB RRR,No Gallops, Rubs or new Murmurs,  +ve B.Sounds, Abd Soft, No tenderness,   No Cyanosis, Clubbing or edema   Data Review: I have personally reviewed the laboratory data and studies available.  Recent Labs  Lab 02/25/22 0144  WBC 4.0  HGB 13.1  HCT 40.6  PLT 264  MCV 90.6  MCH 29.2  MCHC 32.3  RDW 12.9    Recent Labs  Lab 02/25/22 0144  NA 139  K 4.1  CL 105  CO2 24  GLUCOSE 88  BUN 12  CREATININE 1.17*  CALCIUM 8.9  MG 2.1   Signature  Susa Raring M.D on 02/25/2022 at 10:43 AM   -  To page go to [email or web address]

## 2022-02-25 NOTE — Progress Notes (Signed)
Mobility Specialist Progress Note:   02/25/22 1415  Mobility  Activity Ambulated with assistance in hallway  Level of Assistance Contact guard assist, steadying assist  Assistive Device Other (Comment) (HHA)  Distance Ambulated (ft) 500 ft  Activity Response Tolerated well  Mobility Referral Yes  $Mobility charge 1 Mobility   Pt received attempting to leave bed unsupervised. Pt asymptomatic throughout ambulation. Pt left in bed with all needs met, call bell in reach, and bed alarm on.   Myleigh Amara Mobility Specialist-Acute Rehab Secure Chat only

## 2022-02-25 NOTE — Progress Notes (Signed)
Mobility Specialist Progress Note:   02/25/22 0930  Mobility  Activity Ambulated with assistance in hallway  Level of Assistance Contact guard assist, steadying assist  Assistive Device Other (Comment) (HHA)  Distance Ambulated (ft) 500 ft  Activity Response Tolerated well  Mobility Referral Yes  $Mobility charge 1 Mobility   Pt eager for mobility session. Required HHA for comfort. Pt back in bed with all needs met.   Nelta Numbers Acute Rehab Secure Chat or Office Phone: [phone]

## 2022-02-26 NOTE — Progress Notes (Signed)
PROGRESS NOTE  Natalie Brown  DOB: 06-26-1965  PCP: Marliss Coots, NP GQQ:[id number]  DOA: 12/24/2021  LOS: 34 days  Hospital Day: 65  Brief narrative: Natalie Brown is a 56 y.o. female with PMH significant for bipolar disorder, HTN who was hospitalized at [facility] 9/4, patient was brought into the hospital due to persistent altered mental status/tachycardia for admission to [facility] service.   09/07/2021, patient was admitted to behavioral health.  Treated with Zyprexa Invega and Ativan challenge for catatonia. 12/24/2021 sent to ER from [facility] for evaluation for tachycardia 9/04-9/5, 9/5>9/9, LTM EEG  no seizure. TSH stable CTA chest no PE no pneumonia RPR nonreactive bilateral LE Doppler negative for DVT Echo with normal EF  9/23 febrile secondary to Covid 19 + no pneumonia. 9/25 repeat CT brain unremarkable.  She has had prolonged hospitalization seen by psychiatry. Per psychiatry does not meet criteria to go back to behavioral health, awaiting for placement to memory care unit.  Subjective:  Patient in bed eating breakfast, appears comfortable, denies any headache, no fever, no chest pain or pressure, no shortness of breath , no abdominal pain. No new focal weakness.   Assessment and plan:   Early onset Dementia with behavioral disturbance  - 10/09/21, CT head showed evidence of advanced brain atrophy.  12/24/21, MRI brain also shows evidence of advanced cerebral and cerebellar atrophy. Copper level, B1, MMA, TSH unremarkable.  Ammonia level was minimally elevated.  B12 level was minimally low but these minor were abnormalities would not explain presentation.  [name] now likely at her baseline, poor functional status with poor insight and cognition.   Delirium due to multiple etiologies, acute, hyperactive, Bipolar affective disorder, current episode manic with psychotic symptoms  Initially admitted to the behavioral health hospital. Treated for psychosis, bipolar disorder, catatonia. Per  last psychiatry note on 12/31/21 -  CONTINUE Haldol 1 mg every morning and 2 mg nightly; per [facility], plan to taper as tolerated CONTINUE propranolol 10 mg twice daily for anxiety and tachycardia (currently held for hypotension).  11/1, I will switch her from metoprolol to propranolol. CONTINUE atropine sublingual drops every 8 hours as needed drooling Neurology was consulted.  EEG negative for any seizures.  Other metabolic work-up unremarkable.  Mental status seems Cram at the time of my evaluation this morning. Patient is not on any scheduled therapy in the hospital but the patient was on Zyprexa 5 mg nightly, Trileptal 300 mg twice daily at [facility].  Patient may benefit from some scheduled medication.  Defer to psychiatry.    Sinus tachycardia - Currently on Lopressor.  We will switch to propanolol as recommended by psychiatry on 9/11.    Vitamin B12 deficiency - Relative.  MMA level normal.  Currently being replaced.  Drooling - Currently on scopolamine patch.    COVID-19 virus infection - Checked in response to fever on 9/22.  No therapy was indicated.  No evidence of pneumonia.  This problem has resolved.     Skin assessment:     Nutritional status:  Body mass index is 25.62 kg/m.        Diet:  Diet Order             Diet regular Room service appropriate? No; Fluid consistency: Thin  Diet effective now                   DVT prophylaxis:  enoxaparin (LOVENOX) injection 40 mg Start: 12/26/21 1200   Antimicrobials: None Fluid: None  currently Consultants: None Family Communication: None at bedside  Status is: Inpatient  Continue in-hospital care because: Difficult to place Level of care: Med-Surg   Dispo: The patient is from: Home              Anticipated d/c is to: Difficult to place              Patient currently is medically stable to d/c.   Difficult to place patient Yes   Scheduled Meds:  vitamin B-12  1,000 mcg Oral Daily   docusate sodium  200 mg Oral BID    enoxaparin (LOVENOX) injection  40 mg Subcutaneous Q24H   feeding supplement (KATE FARMS STANDARD 1.4)  325 mL Oral BID BM   haloperidol  1 mg Oral BID   hydrocerin   Topical BID   polyethylene glycol  17 g Oral BID   propranolol  10 mg Oral BID   scopolamine  1 patch Transdermal Q72H    PRN meds: acetaminophen, alum & mag hydroxide-simeth, fluticasone, hydrOXYzine, LORazepam, magnesium hydroxide, metoprolol tartrate, OLANZapine zydis **AND** ziprasidone, ondansetron, mouth rinse   Antimicrobials: Anti-infectives (From admission, onward)    None       Objective: Vitals:   02/25/22 2220 02/26/22 0100  BP: 128/68 124/75  Pulse: (!) 103 94  Resp: 18 14  Temp:  97.8 F (36.6 C)  SpO2: 98% 96%   No intake or output data in the 24 hours ending 02/26/22 0957  There were no vitals filed for this visit. Weight change:  Body mass index is 25.62 kg/m.   Physical Exam:  Awake, remains confused, reading magazines, no focal deficits [place].AT,PERRAL Supple Neck, No JVD,   Symmetrical Chest wall movement, Good air movement bilaterally, CTAB RRR,No Gallops, Rubs or new Murmurs,  +ve B.Sounds, Abd Soft, No tenderness,   No Cyanosis, Clubbing or edema   Data Review: I have personally reviewed the laboratory data and studies available.  Recent Labs  Lab 02/25/22 0144  WBC 4.0  HGB 13.1  HCT 40.6  PLT 264  MCV 90.6  MCH 29.2  MCHC 32.3  RDW 12.9    Recent Labs  Lab 02/25/22 0144  NA 139  K 4.1  CL 105  CO2 24  GLUCOSE 88  BUN 12  CREATININE 1.17*  CALCIUM 8.9  MG 2.1   Signature  Susa Raring M.D on 02/26/2022 at 9:57 AM   -  To page go to [email or web address]

## 2022-02-26 NOTE — Progress Notes (Signed)
Mobility Specialist Progress Note:   02/26/22 0915  Mobility  Activity Ambulated with assistance in hallway  Level of Assistance Contact guard assist, steadying assist  Assistive Device Other (Comment) (HHA)  Distance Ambulated (ft) 500 ft  Activity Response Tolerated well  Mobility Referral Yes  $Mobility charge 1 Mobility   Pt eager for mobility session. HHA given for comfort. Pt back in bed with all needs met. Bed alarm on.   Nelta Numbers Acute Rehab Secure Chat or Office Phone: [phone]

## 2022-02-27 NOTE — Progress Notes (Signed)
PROGRESS NOTE  Natalie Brown  DOB: 1965-12-23  PCP: Lavinia Sharps, NP PPJ:[id number]  DOA: 12/24/2021  LOS: 63 days  Hospital Day: 66  Brief narrative: Natalie Brown is a 56 y.o. female with PMH significant for bipolar disorder, HTN who was hospitalized at [facility] 9/4, patient was brought into the hospital due to persistent altered mental status/tachycardia for admission to [facility] service.   09/07/2021, patient was admitted to behavioral health.  Treated with Zyprexa Invega and Ativan challenge for catatonia. 12/24/2021 sent to ER from [facility] for evaluation for tachycardia 9/04-9/5, 9/5>9/9, LTM EEG  no seizure. TSH stable CTA chest no PE no pneumonia RPR nonreactive bilateral LE Doppler negative for DVT Echo with normal EF  9/23 febrile secondary to Covid 19 + no pneumonia. 9/25 repeat CT brain unremarkable.  She has had prolonged hospitalization seen by psychiatry. Per psychiatry does not meet criteria to go back to behavioral health, awaiting for placement to memory care unit.  Subjective:  Patient in bed, appears comfortable, denies any headache, no fever, no chest pain or pressure, no shortness of breath , no abdominal pain. No new focal weakness.    Assessment and plan:   Early onset Dementia with behavioral disturbance  - 10/09/21, CT head showed evidence of advanced brain atrophy.  12/24/21, MRI brain also shows evidence of advanced cerebral and cerebellar atrophy. Copper level, B1, MMA, TSH unremarkable.  Ammonia level was minimally elevated.  B12 level was minimally low but these minor were abnormalities would not explain presentation.  [name] now likely at her baseline, poor functional status with poor insight and cognition.   Delirium due to multiple etiologies, acute, hyperactive, Bipolar affective disorder, current episode manic with psychotic symptoms  Initially admitted to the behavioral health hospital. Treated for psychosis, bipolar disorder, catatonia. Per last psychiatry  note on 12/31/21 -  CONTINUE Haldol 1 mg every morning and 2 mg nightly; per [facility], plan to taper as tolerated CONTINUE propranolol 10 mg twice daily for anxiety and tachycardia (currently held for hypotension).  11/1, I will switch her from metoprolol to propranolol. CONTINUE atropine sublingual drops every 8 hours as needed drooling Neurology was consulted.  EEG negative for any seizures.  Other metabolic work-up unremarkable.  Mental status seems Cram at the time of my evaluation this morning. Patient is not on any scheduled therapy in the hospital but the patient was on Zyprexa 5 mg nightly, Trileptal 300 mg twice daily at [facility].  Patient may benefit from some scheduled medication.  Defer to psychiatry.    Sinus tachycardia - Currently on Lopressor.  We will switch to propanolol as recommended by psychiatry on 9/11.    Vitamin B12 deficiency - Relative.  MMA level normal.  Currently being replaced.  Drooling - Currently on scopolamine patch.    COVID-19 virus infection - Checked in response to fever on 9/22.  No therapy was indicated.  No evidence of pneumonia.  This problem has resolved.     Skin assessment:     Nutritional status:  Body mass index is 25.62 kg/m.        Diet:  Diet Order             Diet regular Room service appropriate? No; Fluid consistency: Thin  Diet effective now                   DVT prophylaxis:  enoxaparin (LOVENOX) injection 40 mg Start: 12/26/21 1200   Antimicrobials: None Fluid: None currently  Consultants: None Family Communication: None at bedside  Status is: Inpatient  Continue in-hospital care because: Difficult to place Level of care: Med-Surg   Dispo: The patient is from: Home              Anticipated d/c is to: Difficult to place              Patient currently is medically stable to d/c.   Difficult to place patient Yes   Scheduled Meds:  vitamin B-12  1,000 mcg Oral Daily   docusate sodium  200 mg Oral BID   enoxaparin  (LOVENOX) injection  40 mg Subcutaneous Q24H   feeding supplement (KATE FARMS STANDARD 1.4)  325 mL Oral BID BM   haloperidol  1 mg Oral BID   hydrocerin   Topical BID   polyethylene glycol  17 g Oral BID   propranolol  10 mg Oral BID   scopolamine  1 patch Transdermal Q72H    PRN meds: acetaminophen, alum & mag hydroxide-simeth, fluticasone, hydrOXYzine, LORazepam, magnesium hydroxide, metoprolol tartrate, OLANZapine zydis **AND** ziprasidone, ondansetron, mouth rinse   Antimicrobials: Anti-infectives (From admission, onward)    None       Objective: Vitals:   02/26/22 2135 02/27/22 0153  BP: (!) 139/119 106/67  Pulse: 100 60  Resp: 18 16  Temp: 98.7 F (37.1 C) (!) 97 F (36.1 C)  SpO2:  97%    Intake/Output Summary (Last 24 hours) at 02/27/2022 0953 Last data filed at 02/27/2022 0900 Gross per 24 hour  Intake 480 ml  Output --  Net 480 ml    There were no vitals filed for this visit. Weight change:  Body mass index is 25.62 kg/m.   Physical Exam:  Awake, remains confused, reading magazines, no focal deficits [place].AT,PERRAL Supple Neck, No JVD,   Symmetrical Chest wall movement, Good air movement bilaterally, CTAB RRR,No Gallops, Rubs or new Murmurs,  +ve B.Sounds, Abd Soft, No tenderness,   No Cyanosis, Clubbing or edema   Data Review: I have personally reviewed the laboratory data and studies available.  Recent Labs  Lab 02/25/22 0144  WBC 4.0  HGB 13.1  HCT 40.6  PLT 264  MCV 90.6  MCH 29.2  MCHC 32.3  RDW 12.9    Recent Labs  Lab 02/25/22 0144  NA 139  K 4.1  CL 105  CO2 24  GLUCOSE 88  BUN 12  CREATININE 1.17*  CALCIUM 8.9  MG 2.1   Signature  Lala Lund M.D on 02/27/2022 at 9:53 AM   -  To page go to [email or web address]

## 2022-02-27 NOTE — TOC Progression Note (Signed)
Transition of Care [facility]) - Progression Note    Patient Details  Name: Natalie Brown MRN: [id number] Date of Birth: 08-20-1965  Transition of Care [facility]) CM/SW Contact  Mearl Latin, LCSW Phone Number: 02/27/2022, 11:06 AM  Clinical Narrative:    CSW left voicemail for Trecia Rogers, Librarian, academic at [facility] [phone]) to determine status of referral.    Barriers to Discharge: Financial Resources, Inadequate or no insurance, Homeless with medical needs  Expected Discharge Plan and Services   In-house Referral: Clinical Social Work   Post Acute Care Choice: Nursing Home Living arrangements for the past 2 months: Homeless [facility]) Expected Discharge Date: 12/24/21                                     Social Determinants of Health (SDOH) Interventions    Readmission Risk Interventions     No data to display

## 2022-02-27 NOTE — Progress Notes (Signed)
Mobility Specialist Progress Note:   02/27/22 0915  Mobility  Activity Ambulated with assistance in hallway  Level of Assistance Contact guard assist, steadying assist  Assistive Device Other (Comment) (HHA)  Distance Ambulated (ft) 500 ft  Activity Response Tolerated well  Mobility Referral Yes  $Mobility charge 1 Mobility   Pt eager for mobility session this am. Required only HHA for comfort. Pt back in bed with all needs met.   Nelta Numbers Acute Rehab Secure Chat or Office Phone: [phone]

## 2022-02-28 NOTE — TOC Progression Note (Signed)
Transition of Care [facility]) - Progression Note    Patient Details  Name: Natalie Brown MRN: [id number] Date of Birth: 1965/08/15  Transition of Care [facility]) CM/SW Contact  Mearl Latin, LCSW Phone Number: 02/28/2022, 4:17 PM  Clinical Narrative:    CSW spoke with Inetta Fermo at [facility]. She will contact Medicaid and patient's mother to determine payer info.     Barriers to Discharge: Financial Resources, Inadequate or no insurance, Homeless with medical needs  Expected Discharge Plan and Services   In-house Referral: Clinical Social Work   Post Acute Care Choice: Nursing Home Living arrangements for the past 2 months: Homeless [facility]) Expected Discharge Date: 12/24/21                                     Social Determinants of Health (SDOH) Interventions    Readmission Risk Interventions     No data to display

## 2022-02-28 NOTE — Progress Notes (Signed)
PROGRESS NOTE  Natalie Brown  DOB: 07/08/1965  PCP: Natalie Sharps, NP RKY:[id number]  DOA: 12/24/2021  LOS: 64 days  Hospital Day: 67  Brief narrative: Natalie Brown is a 56 y.o. female with PMH significant for bipolar disorder, HTN who was hospitalized at [facility] 9/4, patient was brought into the hospital due to persistent altered mental status/tachycardia for admission to [facility] service.   09/07/2021, patient was admitted to behavioral health.  Treated with Zyprexa Invega and Ativan challenge for catatonia. 12/24/2021 sent to ER from [facility] for evaluation for tachycardia 9/04-9/5, 9/5>9/9, LTM EEG  no seizure. TSH stable CTA chest no PE no pneumonia RPR nonreactive bilateral LE Doppler negative for DVT Echo with normal EF  9/23 febrile secondary to Covid 19 + no pneumonia. 9/25 repeat CT brain unremarkable.  She has had prolonged hospitalization seen by psychiatry. Per psychiatry does not meet criteria to go back to behavioral health, awaiting for placement to memory care unit.  Subjective: Patient in bed sleeping, denies any headache or chest pain.  Assessment and plan:  Early onset Dementia with behavioral disturbance  - 10/09/21, CT head showed evidence of advanced brain atrophy.  12/24/21, MRI brain also shows evidence of advanced cerebral and cerebellar atrophy. Copper level, B1, MMA, TSH unremarkable.  Ammonia level was minimally elevated.  B12 level was minimally low but these minor were abnormalities would not explain presentation.  [name] now likely at her baseline, poor functional status with poor insight and cognition.   Delirium due to multiple etiologies, acute, hyperactive, Bipolar affective disorder, current episode manic with psychotic symptoms  Initially admitted to the behavioral health hospital. Treated for psychosis, bipolar disorder, catatonia. Per last psychiatry note on 12/31/21 -  CONTINUE Haldol 1 mg every morning and 2 mg nightly; per [facility], plan to taper as  tolerated CONTINUE propranolol 10 mg twice daily for anxiety and tachycardia (currently held for hypotension).  11/1, I will switch her from metoprolol to propranolol. CONTINUE atropine sublingual drops every 8 hours as needed drooling Neurology was consulted.  EEG negative for any seizures.  Other metabolic work-up unremarkable.  Mental status seems Cram at the time of my evaluation this morning. Patient is not on any scheduled therapy in the hospital but the patient was on Zyprexa 5 mg nightly, Trileptal 300 mg twice daily at [facility].  Patient may benefit from some scheduled medication.  Defer to psychiatry.    Sinus tachycardia - Currently on Lopressor.  We will switch to propanolol as recommended by psychiatry on 9/11.    Vitamin B12 deficiency - Relative.  MMA level normal.  Currently being replaced.  Drooling - Currently on scopolamine patch.    COVID-19 virus infection - Checked in response to fever on 9/22.  No therapy was indicated.  No evidence of pneumonia.  This problem has resolved.     Skin assessment:     Nutritional status:  Body mass index is 25.62 kg/m.        Diet:  Diet Order             Diet regular Room service appropriate? No; Fluid consistency: Thin  Diet effective now                   DVT prophylaxis:  enoxaparin (LOVENOX) injection 40 mg Start: 12/26/21 1200   Antimicrobials: None Fluid: None currently Consultants: None Family Communication: None at bedside  Status is: Inpatient  Continue in-hospital care because: Difficult to place Level of  care: Med-Surg   Dispo: The patient is from: Home              Anticipated d/c is to: Difficult to place              Patient currently is medically stable to d/c.   Difficult to place patient Yes   Scheduled Meds:  vitamin B-12  1,000 mcg Oral Daily   docusate sodium  200 mg Oral BID   enoxaparin (LOVENOX) injection  40 mg Subcutaneous Q24H   feeding supplement (KATE FARMS STANDARD 1.4)  325 mL  Oral BID BM   haloperidol  1 mg Oral BID   hydrocerin   Topical BID   polyethylene glycol  17 g Oral BID   propranolol  10 mg Oral BID   scopolamine  1 patch Transdermal Q72H    PRN meds: acetaminophen, alum & mag hydroxide-simeth, fluticasone, hydrOXYzine, LORazepam, magnesium hydroxide, metoprolol tartrate, OLANZapine zydis **AND** ziprasidone, ondansetron, mouth rinse   Antimicrobials: Anti-infectives (From admission, onward)    None       Objective: Vitals:   02/28/22 0323 02/28/22 0836  BP: 112/74   Pulse: 89   Resp: 18 19  Temp: 98 F (36.7 C) 97.9 F (36.6 C)  SpO2:      Intake/Output Summary (Last 24 hours) at 02/28/2022 0905 Last data filed at 02/27/2022 1828 Gross per 24 hour  Intake 480 ml  Output --  Net 480 ml    There were no vitals filed for this visit. Weight change:  Body mass index is 25.62 kg/m.   Physical Exam:  Awake, remains confused, reading magazines, no focal deficits [place].AT,PERRAL Supple Neck, No JVD,   Symmetrical Chest wall movement, Good air movement bilaterally, CTAB RRR,No Gallops, Rubs or new Murmurs,  +ve B.Sounds, Abd Soft, No tenderness,   No Cyanosis, Clubbing or edema   Data Review: I have personally reviewed the laboratory data and studies available.  Recent Labs  Lab 02/25/22 0144  WBC 4.0  HGB 13.1  HCT 40.6  PLT 264  MCV 90.6  MCH 29.2  MCHC 32.3  RDW 12.9    Recent Labs  Lab 02/25/22 0144  NA 139  K 4.1  CL 105  CO2 24  GLUCOSE 88  BUN 12  CREATININE 1.17*  CALCIUM 8.9  MG 2.1   Signature  Susa Raring M.D on 02/28/2022 at 9:05 AM   -  To page go to [email or web address]

## 2022-03-01 NOTE — Progress Notes (Signed)
Report given to oncoming shift Copywriter, advertising. Patient stable at change of shift

## 2022-03-01 NOTE — Progress Notes (Signed)
Mobility Specialist Progress Note:   03/01/22 0901  Mobility  Activity Ambulated with assistance in hallway  Level of Assistance Contact guard assist, steadying assist  Assistive Device Other (Comment) (HHA)  Distance Ambulated (ft) 500 ft  Activity Response Tolerated well  Mobility Referral Yes  $Mobility charge 1 Mobility   Pt eager for mobility session. Required only HHA for comfort throughout. Pt back in bed with all needs met, bed alarm on.   Nelta Numbers Mobility Specialist Please contact via SecureChat or  Rehab office at [phone]

## 2022-03-01 NOTE — Plan of Care (Signed)
  Problem: Safety: Goal: Non-violent Restraint(s) Outcome: Progressing   Problem: Education: Goal: Knowledge of General Education information will improve Description: Including pain rating scale, medication(s)/side effects and non-pharmacologic comfort measures Outcome: Progressing   Problem: Health Behavior/Discharge Planning: Goal: Ability to manage health-related needs will improve Outcome: Progressing   Problem: Clinical Measurements: Goal: Respiratory complications will improve Outcome: Progressing   Problem: Safety: Goal: Ability to remain free from injury will improve Outcome: Progressing

## 2022-03-01 NOTE — Progress Notes (Signed)
Patient's dinner at bedside. Patient is currently sleeping.

## 2022-03-01 NOTE — Progress Notes (Signed)
PROGRESS NOTE  Natalie Brown  DOB: 05-Mar-1966  PCP: Lavinia Sharps, NP QIH:[id number]  DOA: 12/24/2021  LOS: 65 days  Hospital Day: 68  Brief narrative: Natalie Brown is a 56 y.o. female with PMH significant for bipolar disorder, HTN who was hospitalized at [facility] 9/4, patient was brought into the hospital due to persistent altered mental status/tachycardia for admission to [facility] service.   09/07/2021, patient was admitted to behavioral health.  Treated with Zyprexa Invega and Ativan challenge for catatonia. 12/24/2021 sent to ER from [facility] for evaluation for tachycardia 9/04-9/5, 9/5>9/9, LTM EEG  no seizure. TSH stable CTA chest no PE no pneumonia RPR nonreactive bilateral LE Doppler negative for DVT Echo with normal EF  9/23 febrile secondary to Covid 19 + no pneumonia. 9/25 repeat CT brain unremarkable.  She has had prolonged hospitalization seen by psychiatry. Per psychiatry does not meet criteria to go back to behavioral health, awaiting for placement to memory care unit.  Subjective: Patient in bed, appears comfortable, denies any headache, no fever, no chest pain or pressure, no shortness of breath , no abdominal pain. No new focal weakness.  Assessment and plan:  Early onset Dementia with behavioral disturbance  - 10/09/21, CT head showed evidence of advanced brain atrophy.  12/24/21, MRI brain also shows evidence of advanced cerebral and cerebellar atrophy. Copper level, B1, MMA, TSH unremarkable.  Ammonia level was minimally elevated.  B12 level was minimally low but these minor were abnormalities would not explain presentation.  [name] now likely at her baseline, poor functional status with poor insight and cognition.   Delirium due to multiple etiologies, acute, hyperactive, Bipolar affective disorder, current episode manic with psychotic symptoms  Initially admitted to the behavioral health hospital. Treated for psychosis, bipolar disorder, catatonia. Per last psychiatry note on  12/31/21 -  CONTINUE Haldol 1 mg every morning and 2 mg nightly; per [facility], plan to taper as tolerated CONTINUE propranolol 10 mg twice daily for anxiety and tachycardia (currently held for hypotension).  11/1, I will switch her from metoprolol to propranolol. CONTINUE atropine sublingual drops every 8 hours as needed drooling Neurology was consulted.  EEG negative for any seizures.  Other metabolic work-up unremarkable.  Mental status seems Cram at the time of my evaluation this morning. Patient is not on any scheduled therapy in the hospital but the patient was on Zyprexa 5 mg nightly, Trileptal 300 mg twice daily at [facility].  Patient may benefit from some scheduled medication.  Defer to psychiatry.    Sinus tachycardia - Currently on Lopressor.  We will switch to propanolol as recommended by psychiatry on 9/11.    Vitamin B12 deficiency - Relative.  MMA level normal.  Currently being replaced.  Drooling - Currently on scopolamine patch.    COVID-19 virus infection - Checked in response to fever on 9/22.  No therapy was indicated.  No evidence of pneumonia.  This problem has resolved.     Skin assessment:     Nutritional status:  Body mass index is 25.62 kg/m.        Diet:  Diet Order             Diet regular Room service appropriate? No; Fluid consistency: Thin  Diet effective now                   DVT prophylaxis:  enoxaparin (LOVENOX) injection 40 mg Start: 12/26/21 1200   Antimicrobials: None Fluid: None currently Consultants: None Family Communication:  None at bedside  Status is: Inpatient  Continue in-hospital care because: Difficult to place Level of care: Med-Surg   Dispo: The patient is from: Home              Anticipated d/c is to: Difficult to place              Patient currently is medically stable to d/c.   Difficult to place patient Yes   Scheduled Meds:  vitamin B-12  1,000 mcg Oral Daily   docusate sodium  200 mg Oral BID   enoxaparin (LOVENOX)  injection  40 mg Subcutaneous Q24H   feeding supplement (KATE FARMS STANDARD 1.4)  325 mL Oral BID BM   haloperidol  1 mg Oral BID   hydrocerin   Topical BID   polyethylene glycol  17 g Oral BID   propranolol  10 mg Oral BID   scopolamine  1 patch Transdermal Q72H    PRN meds: acetaminophen, alum & mag hydroxide-simeth, fluticasone, hydrOXYzine, LORazepam, magnesium hydroxide, metoprolol tartrate, OLANZapine zydis **AND** ziprasidone, ondansetron, mouth rinse   Antimicrobials: Anti-infectives (From admission, onward)    None       Objective: Vitals:   03/01/22 0000 03/01/22 0400  BP: 114/63 105/67  Pulse: 61 69  Resp:    Temp: (!) 97.5 F (36.4 C) (!) 97.5 F (36.4 C)  SpO2: 100% 98%    Intake/Output Summary (Last 24 hours) at 03/01/2022 0839 Last data filed at 02/28/2022 1500 Gross per 24 hour  Intake 600 ml  Output --  Net 600 ml    There were no vitals filed for this visit. Weight change:  Body mass index is 25.62 kg/m.   Physical Exam:  Awake, remains confused, reading magazines, no focal deficits [place].AT,PERRAL Supple Neck, No JVD,   Symmetrical Chest wall movement, Good air movement bilaterally, CTAB RRR,No Gallops, Rubs or new Murmurs,  +ve B.Sounds, Abd Soft, No tenderness,   No Cyanosis, Clubbing or edema   Data Review: I have personally reviewed the laboratory data and studies available.  Recent Labs  Lab 02/25/22 0144  WBC 4.0  HGB 13.1  HCT 40.6  PLT 264  MCV 90.6  MCH 29.2  MCHC 32.3  RDW 12.9    Recent Labs  Lab 02/25/22 0144  NA 139  K 4.1  CL 105  CO2 24  GLUCOSE 88  BUN 12  CREATININE 1.17*  CALCIUM 8.9  MG 2.1    Signature  Susa Raring M.D on 03/01/2022 at 8:39 AM   -  To page go to [email or web address]

## 2022-03-02 NOTE — Progress Notes (Signed)
PROGRESS NOTE  Natalie Brown  DOB: 09-Sep-1965  PCP: Lavinia Sharps, NP VZD:[id number]  DOA: 12/24/2021  LOS: 66 days  Hospital Day: 69  Brief narrative: Natalie Brown is a 56 y.o. female with PMH significant for bipolar disorder, HTN who was hospitalized at [facility] 9/4, patient was brought into the hospital due to persistent altered mental status/tachycardia for admission to [facility] service.   09/07/2021, patient was admitted to behavioral health.  Treated with Zyprexa Invega and Ativan challenge for catatonia. 12/24/2021 sent to ER from [facility] for evaluation for tachycardia 9/04-9/5, 9/5>9/9, LTM EEG  no seizure. TSH stable CTA chest no PE no pneumonia RPR nonreactive bilateral LE Doppler negative for DVT Echo with normal EF  9/23 febrile secondary to Covid 19 + no pneumonia. 9/25 repeat CT brain unremarkable.  She has had prolonged hospitalization seen by psychiatry. Per psychiatry does not meet criteria to go back to behavioral health, awaiting for placement to memory care unit.   Subjective: Patient in bed, appears comfortable, denies any headache, no fever, no chest pain or pressure, no shortness of breath , no abdominal pain. No new focal weakness.   Assessment and plan:  Early onset Dementia with behavioral disturbance  - 10/09/21, CT head showed evidence of advanced brain atrophy.  12/24/21, MRI brain also shows evidence of advanced cerebral and cerebellar atrophy. Copper level, B1, MMA, TSH unremarkable.  Ammonia level was minimally elevated.  B12 level was minimally low but these minor were abnormalities would not explain presentation.  [name] now likely at her baseline, poor functional status with poor insight and cognition.   Delirium due to multiple etiologies, acute, hyperactive, Bipolar affective disorder, current episode manic with psychotic symptoms  Initially admitted to the behavioral health hospital. Treated for psychosis, bipolar disorder, catatonia. Per last psychiatry note  on 12/31/21 -  CONTINUE Haldol 1 mg every morning and 2 mg nightly; per [facility], plan to taper as tolerated CONTINUE propranolol 10 mg twice daily for anxiety and tachycardia (currently held for hypotension).  11/1, I will switch her from metoprolol to propranolol. CONTINUE atropine sublingual drops every 8 hours as needed drooling Neurology was consulted.  EEG negative for any seizures.  Other metabolic work-up unremarkable.  Mental status seems Cram at the time of my evaluation this morning. Patient is not on any scheduled therapy in the hospital but the patient was on Zyprexa 5 mg nightly, Trileptal 300 mg twice daily at [facility].  Patient may benefit from some scheduled medication.  Defer to psychiatry.    Sinus tachycardia - Currently on Lopressor.  We will switch to propanolol as recommended by psychiatry on 9/11.    Vitamin B12 deficiency - Relative.  MMA level normal.  Currently being replaced.  Drooling - Currently on scopolamine patch.    COVID-19 virus infection - Checked in response to fever on 9/22.  No therapy was indicated.  No evidence of pneumonia.  This problem has resolved.     Skin assessment:     Nutritional status:  Body mass index is 25.62 kg/m.        Diet:  Diet Order             Diet regular Room service appropriate? No; Fluid consistency: Thin  Diet effective now                   DVT prophylaxis:  enoxaparin (LOVENOX) injection 40 mg Start: 12/26/21 1200   Antimicrobials: None Fluid: None currently Consultants: None  Family Communication: None at bedside  Status is: Inpatient  Continue in-hospital care because: Difficult to place Level of care: Med-Surg   Dispo: The patient is from: Home              Anticipated d/c is to: Difficult to place              Patient currently is medically stable to d/c.   Difficult to place patient Yes   Scheduled Meds:  vitamin B-12  1,000 mcg Oral Daily   docusate sodium  200 mg Oral BID   enoxaparin  (LOVENOX) injection  40 mg Subcutaneous Q24H   feeding supplement (KATE FARMS STANDARD 1.4)  325 mL Oral BID BM   haloperidol  1 mg Oral BID   hydrocerin   Topical BID   polyethylene glycol  17 g Oral BID   propranolol  10 mg Oral BID   scopolamine  1 patch Transdermal Q72H    PRN meds: acetaminophen, alum & mag hydroxide-simeth, fluticasone, hydrOXYzine, LORazepam, magnesium hydroxide, metoprolol tartrate, OLANZapine zydis **AND** ziprasidone, ondansetron, mouth rinse   Antimicrobials: Anti-infectives (From admission, onward)    None       Objective: Vitals:   03/01/22 2300 03/02/22 0819  BP: 117/89 98/85  Pulse:  84  Resp:  18  Temp:  98.8 F (37.1 C)  SpO2:  98%    Intake/Output Summary (Last 24 hours) at 03/02/2022 0933 Last data filed at 03/01/2022 1954 Gross per 24 hour  Intake 240 ml  Output --  Net 240 ml    There were no vitals filed for this visit. Weight change:  Body mass index is 25.62 kg/m.   Physical Exam:  Awake, remains confused, reading magazines, no focal deficits [place].AT,PERRAL Supple Neck, No JVD,   Symmetrical Chest wall movement, Good air movement bilaterally, CTAB RRR,No Gallops, Rubs or new Murmurs,  +ve B.Sounds, Abd Soft, No tenderness,   No Cyanosis, Clubbing or edema   Data Review: I have personally reviewed the laboratory data and studies available.  Recent Labs  Lab 02/25/22 0144  WBC 4.0  HGB 13.1  HCT 40.6  PLT 264  MCV 90.6  MCH 29.2  MCHC 32.3  RDW 12.9    Recent Labs  Lab 02/25/22 0144  NA 139  K 4.1  CL 105  CO2 24  GLUCOSE 88  BUN 12  CREATININE 1.17*  CALCIUM 8.9  MG 2.1    Signature  Susa Raring M.D on 03/02/2022 at 9:33 AM   -  To page go to [email or web address]

## 2022-03-02 NOTE — Progress Notes (Signed)
Mobility Specialist Progress Note:   03/02/22 1025  Mobility  Activity Ambulated with assistance in hallway  Level of Assistance Contact guard assist, steadying assist  Assistive Device Other (Comment) (HHA)  Distance Ambulated (ft) 300 ft  Activity Response Tolerated well  Mobility Referral Yes  $Mobility charge 1 Mobility   Pt eager for mobility session. Required only HHA for comfort. Pt back in bed with all needs met, bed alarm on.   Nelta Numbers Mobility Specialist Please contact via SecureChat or  Rehab office at [phone]

## 2022-03-03 NOTE — Progress Notes (Signed)
Mobility Specialist Progress Note:   03/03/22 1335  Mobility  Activity Ambulated with assistance to bathroom  Level of Assistance Standby assist, set-up cues, supervision of patient - no hands on  Assistive Device None  Distance Ambulated (ft) 30 ft  Activity Response Tolerated well  Mobility Referral Yes  $Mobility charge 1 Mobility   Pt requesting to go to BR. Required no physical assistance. Left with all needs met.  Nelta Numbers Mobility Specialist Please contact via SecureChat or  Rehab office at [phone]

## 2022-03-03 NOTE — Progress Notes (Signed)
PROGRESS NOTE  [name]  DOB: [birth year]  PCP: Lavinia Sharps, NP SWH:[id number]  DOA: 12/24/2021  LOS: 67 days  Hospital Day: 73  Brief narrative: Natalie Brown is a 56 y.o. female with PMH significant for bipolar disorder, HTN who was hospitalized at [facility] 9/4, patient was brought into the hospital due to persistent altered mental status/tachycardia for admission to [facility] service.   09/07/2021, patient was admitted to behavioral health.  Treated with Zyprexa Invega and Ativan challenge for catatonia. 12/24/2021 sent to ER from [facility] for evaluation for tachycardia 9/04-9/5, 9/5>9/9, LTM EEG  no seizure. TSH stable CTA chest no PE no pneumonia RPR nonreactive bilateral LE Doppler negative for DVT Echo with normal EF  9/23 febrile secondary to Covid 19 + no pneumonia. 9/25 repeat CT brain unremarkable.  She has had prolonged hospitalization seen by psychiatry. Per psychiatry does not meet criteria to go back to behavioral health, awaiting for placement to memory care unit.   Subjective: Patient in bed, appears comfortable, denies any headache, no fever, no chest pain or pressure, no shortness of breath , no abdominal pain. No new focal weakness.   Assessment and plan:  Early onset Dementia with behavioral disturbance  - 10/09/21, CT head showed evidence of advanced brain atrophy.  12/24/21, MRI brain also shows evidence of advanced cerebral and cerebellar atrophy. Copper level, B1, MMA, TSH unremarkable.  Ammonia level was minimally elevated.  B12 level was minimally low but these minor were abnormalities would not explain presentation.  [name] now likely at her baseline, poor functional status with poor insight and cognition.   Delirium due to multiple etiologies, acute, hyperactive, Bipolar affective disorder, current episode manic with psychotic symptoms  Initially admitted to the behavioral health hospital. Treated for psychosis, bipolar disorder, catatonia. Per last psychiatry note  on 12/31/21 -  CONTINUE Haldol 1 mg every morning and 2 mg nightly; per [facility], plan to taper as tolerated CONTINUE propranolol 10 mg twice daily for anxiety and tachycardia (currently held for hypotension).  11/1, I will switch her from metoprolol to propranolol. CONTINUE atropine sublingual drops every 8 hours as needed drooling Neurology was consulted.  EEG negative for any seizures.  Other metabolic work-up unremarkable.  Mental status seems Cram at the time of my evaluation this morning. Patient is not on any scheduled therapy in the hospital but the patient was on Zyprexa 5 mg nightly, Trileptal 300 mg twice daily at [facility].  Patient may benefit from some scheduled medication.  Defer to psychiatry.    Sinus tachycardia - Currently on Lopressor.  We will switch to propanolol as recommended by psychiatry on 9/11.    Vitamin B12 deficiency - Relative.  MMA level normal.  Currently being replaced.  Drooling - Currently on scopolamine patch.    COVID-19 virus infection - Checked in response to fever on 9/22.  No therapy was indicated.  No evidence of pneumonia.  This problem has resolved.     Skin assessment:     Nutritional status:  Body mass index is 25.62 kg/m.        Diet:  Diet Order             Diet regular Room service appropriate? No; Fluid consistency: Thin  Diet effective now                   DVT prophylaxis:  enoxaparin (LOVENOX) injection 40 mg Start: 12/26/21 1200   Antimicrobials: None Fluid: None currently Consultants: None  Family Communication: None at bedside  Status is: Inpatient  Continue in-hospital care because: Difficult to place Level of care: Med-Surg   Dispo: The patient is from: Home              Anticipated d/c is to: Difficult to place              Patient currently is medically stable to d/c.   Difficult to place patient Yes   Scheduled Meds:  vitamin B-12  1,000 mcg Oral Daily   docusate sodium  200 mg Oral BID   enoxaparin  (LOVENOX) injection  40 mg Subcutaneous Q24H   feeding supplement (KATE FARMS STANDARD 1.4)  325 mL Oral BID BM   haloperidol  1 mg Oral BID   hydrocerin   Topical BID   polyethylene glycol  17 g Oral BID   propranolol  10 mg Oral BID   scopolamine  1 patch Transdermal Q72H    PRN meds: acetaminophen, alum & mag hydroxide-simeth, fluticasone, hydrOXYzine, LORazepam, magnesium hydroxide, metoprolol tartrate, OLANZapine zydis **AND** ziprasidone, ondansetron, mouth rinse   Antimicrobials: Anti-infectives (From admission, onward)    None       Objective: Vitals:   03/02/22 1634 03/03/22 0418  BP: 114/62 93/71  Pulse: 99 64  Resp: 18 17  Temp: 98.4 F (36.9 C) 97.7 F (36.5 C)  SpO2: 97%    No intake or output data in the 24 hours ending 03/03/22 1027   There were no vitals filed for this visit. Weight change:  Body mass index is 25.62 kg/m.   Physical Exam:  Awake, remains confused, reading magazines, no focal deficits [place].AT,PERRAL Supple Neck, No JVD,   Symmetrical Chest wall movement, Good air movement bilaterally, CTAB RRR,No Gallops, Rubs or new Murmurs,  +ve B.Sounds, Abd Soft, No tenderness,   No Cyanosis, Clubbing or edema   Data Review: I have personally reviewed the laboratory data and studies available.  Recent Labs  Lab 02/25/22 0144  WBC 4.0  HGB 13.1  HCT 40.6  PLT 264  MCV 90.6  MCH 29.2  MCHC 32.3  RDW 12.9    Recent Labs  Lab 02/25/22 0144  NA 139  K 4.1  CL 105  CO2 24  GLUCOSE 88  BUN 12  CREATININE 1.17*  CALCIUM 8.9  MG 2.1    Signature  Susa Raring M.D on 03/03/2022 at 10:27 AM   -  To page go to [email or web address]

## 2022-03-03 NOTE — Progress Notes (Signed)
Mobility Specialist Progress Note:   03/03/22 0930  Mobility  Activity Ambulated with assistance in hallway  Level of Assistance Contact guard assist, steadying assist  Assistive Device Other (Comment) (HHA)  Distance Ambulated (ft) 500 ft  Activity Response Tolerated well  Mobility Referral Yes  $Mobility charge 1 Mobility   Pt difficult to arouse this am, however agreeable once awake. Pt asx throughout, back in bed with all needs met.   Nelta Numbers Mobility Specialist Please contact via SecureChat or  Rehab office at [phone]

## 2022-03-04 LAB — BASIC METABOLIC PANEL
Anion gap: 9 (ref 5–15)
BUN: 13 mg/dL (ref 6–20)
CO2: 26 mmol/L (ref 22–32)
Calcium: 8.6 mg/dL — ABNORMAL LOW (ref 8.9–10.3)
Chloride: 103 mmol/L (ref 98–111)
Creatinine, Ser: 1.15 mg/dL — ABNORMAL HIGH (ref 0.44–1.00)
GFR, Estimated: 57 mL/min — ABNORMAL LOW (ref >60)
Glucose, Bld: 112 mg/dL — ABNORMAL HIGH (ref 70–99)
Potassium: 3.8 mmol/L (ref 3.5–5.1)
Sodium: 138 mmol/L (ref 135–145)

## 2022-03-04 LAB — CBC
HCT: 39.7 % (ref 36.0–46.0)
Hemoglobin: 12.8 g/dL (ref 12.0–15.0)
MCH: 29.5 pg (ref 26.0–34.0)
MCHC: 32.2 g/dL (ref 30.0–36.0)
MCV: 91.5 fL (ref 80.0–100.0)
Platelets: 278 10*3/uL (ref 150–400)
RBC: 4.34 MIL/uL (ref 3.87–5.11)
RDW: 13.2 % (ref 11.5–15.5)
WBC: 4.9 10*3/uL (ref 4.0–10.5)
nRBC: 0 % (ref 0.0–0.2)

## 2022-03-04 LAB — MAGNESIUM: Magnesium: 2.1 mg/dL (ref 1.7–2.4)

## 2022-03-04 MED ORDER — HALOPERIDOL LACTATE 5 MG/ML IJ SOLN
2.0000 mg | Freq: Once | INTRAMUSCULAR | Status: AC
  Administered 2022-03-04: 2 mg via INTRAMUSCULAR
  Filled 2022-03-04: qty 1

## 2022-03-04 MED ORDER — OYSTER SHELL CALCIUM/D3 500-5 MG-MCG PO TABS
1.0000 | ORAL_TABLET | Freq: Two times a day (BID) | ORAL | Status: AC
  Administered 2022-03-04 (×2): 1 via ORAL
  Filled 2022-03-04 (×3): qty 1

## 2022-03-04 NOTE — Progress Notes (Signed)
Verbal order from Dr. Thedore Mins for Haldol 2 mg IM once

## 2022-03-04 NOTE — Progress Notes (Signed)
PROGRESS NOTE  Natalie Brown  DOB: 1965-08-07  PCP: Lavinia Sharps, NP DDU:[id number]  DOA: 12/24/2021  LOS: 68 days  Hospital Day: 71  Brief narrative: Natalie Brown is a 56 y.o. female with PMH significant for bipolar disorder, HTN who was hospitalized at [facility] 9/4, patient was brought into the hospital due to persistent altered mental status/tachycardia for admission to [facility] service.   09/07/2021, patient was admitted to behavioral health.  Treated with Zyprexa Invega and Ativan challenge for catatonia. 12/24/2021 sent to ER from [facility] for evaluation for tachycardia 9/04-9/5, 9/5>9/9, LTM EEG  no seizure. TSH stable CTA chest no PE no pneumonia RPR nonreactive bilateral LE Doppler negative for DVT Echo with normal EF  9/23 febrile secondary to Covid 19 + no pneumonia. 9/25 repeat CT brain unremarkable.  She has had prolonged hospitalization seen by psychiatry. Per psychiatry does not meet criteria to go back to behavioral health, awaiting for placement to memory care unit.   Subjective: Patient in bed, appears comfortable, denies any headache, no fever, no chest pain or pressure, no shortness of breath , no abdominal pain. No new focal weakness.  Assessment and plan:  Delirium due to multiple etiologies, acute, hyperactive, Bipolar affective disorder, current episode manic with psychotic symptoms   Initially admitted to the behavioral health hospital. Treated for psychosis, bipolar disorder, catatonia. Per last psychiatry note on 12/31/21 -  CONTINUE Haldol 1 mg every morning and 2 mg nightly; per [facility], plan to taper as tolerated CONTINUE propranolol 10 mg twice daily for anxiety and tachycardia (currently held for hypotension).  11/1, I will switch her from metoprolol to propranolol. CONTINUE atropine sublingual drops every 8 hours as needed drooling.   Neurology was consulted.  EEG negative for any seizures.  Other metabolic work-up unremarkable.Mental status seems Cram at the  time of my evaluation this morning. Patient is not on any scheduled therapy in the hospital but the patient was on Zyprexa 5 mg nightly, Trileptal 300 mg twice daily at [facility].  Patient may benefit from some scheduled medication.  Defer to psychiatry.    Early onset Dementia with behavioral disturbance  - 10/09/21, CT head showed evidence of advanced brain atrophy.  12/24/21, MRI brain also shows evidence of advanced cerebral and cerebellar atrophy. Copper level, B1, MMA, TSH unremarkable.  Ammonia level was minimally elevated.  B12 level was minimally low but these minor were abnormalities would not explain presentation.  [name] now likely at her baseline, poor functional status with poor insight and cognition.  Sinus tachycardia - Currently on Lopressor.  We will switch to propanolol as recommended by psychiatry on 9/11.    Vitamin B12 deficiency - Relative.  MMA level normal.  Currently being replaced.  Drooling - Currently on scopolamine patch.    COVID-19 virus infection - Checked in response to fever on 9/22.  No therapy was indicated.  No evidence of pneumonia.  This problem has resolved.   Mild hypocalcemia.  Replaced will monitor.    Diet:  Diet Order             Diet regular Room service appropriate? No; Fluid consistency: Thin  Diet effective now                   DVT prophylaxis:  enoxaparin (LOVENOX) injection 40 mg Start: 12/26/21 1200   Antimicrobials: None Fluid: None currently Consultants: None Family Communication: None at bedside  Status is: Inpatient  Continue in-hospital care because: Difficult to  place Level of care: Med-Surg   Dispo: The patient is from: Home              Anticipated d/c is to: Difficult to place              Patient currently is medically stable to d/c.   Difficult to place patient Yes   Scheduled Meds:  calcium-vitamin D  1 tablet Oral BID   vitamin B-12  1,000 mcg Oral Daily   docusate sodium  200 mg Oral BID   enoxaparin  (LOVENOX) injection  40 mg Subcutaneous Q24H   feeding supplement (KATE FARMS STANDARD 1.4)  325 mL Oral BID BM   haloperidol  1 mg Oral BID   hydrocerin   Topical BID   polyethylene glycol  17 g Oral BID   propranolol  10 mg Oral BID   scopolamine  1 patch Transdermal Q72H    PRN meds: acetaminophen, alum & mag hydroxide-simeth, fluticasone, hydrOXYzine, LORazepam, magnesium hydroxide, metoprolol tartrate, OLANZapine zydis **AND** ziprasidone, ondansetron, mouth rinse   Antimicrobials: Anti-infectives (From admission, onward)    None       Objective: Vitals:   03/03/22 1610 03/03/22 2001  BP: 106/65 (!) 125/98  Pulse: 94 92  Resp: 18 16  Temp: 98 F (36.7 C) 98.5 F (36.9 C)  SpO2: 99% 100%   No intake or output data in the 24 hours ending 03/04/22 1048   There were no vitals filed for this visit. Weight change:  Body mass index is 25.62 kg/m.   Physical Exam:  Awake, remains confused, reading magazines, no focal deficits [place].AT,PERRAL Supple Neck, No JVD,   Symmetrical Chest wall movement, Good air movement bilaterally, CTAB RRR,No Gallops, Rubs or new Murmurs,  +ve B.Sounds, Abd Soft, No tenderness,   No Cyanosis, Clubbing or edema   Data Review: I have personally reviewed the laboratory data and studies available.  Recent Labs  Lab 03/04/22 0305  WBC 4.9  HGB 12.8  HCT 39.7  PLT 278  MCV 91.5  MCH 29.5  MCHC 32.2  RDW 13.2    Recent Labs  Lab 03/04/22 0305  NA 138  K 3.8  MG 2.1  CL 103  CO2 26  GLUCOSE 112*  BUN 13  CREATININE 1.15*  CALCIUM 8.6*      Signature  Susa Raring M.D on 03/04/2022 at 10:48 AM   -  To page go to [email or web address]

## 2022-03-04 NOTE — Progress Notes (Signed)
Mobility Specialist Progress Note:   03/04/22 1625  Mobility  Activity Ambulated with assistance in hallway  Level of Assistance Contact guard assist, steadying assist  Assistive Device Other (Comment) (HHA)  Distance Ambulated (ft) 500 ft  Activity Response Tolerated well  Mobility Referral Yes  $Mobility charge 1 Mobility   Pt received ambulating in hallway. Pt asymptomatic throughout ambulation. Pt left sitting EOB with all needs met and call bell in reach.   Andrey Campanile Mobility Specialist Please contact via SecureChat or  Rehab office at [phone]

## 2022-03-04 NOTE — Progress Notes (Signed)
Mobility Specialist Progress Note:   03/04/22 1039  Mobility  Activity Ambulated with assistance in hallway  Level of Assistance Contact guard assist, steadying assist  Assistive Device Other (Comment) (HHA)  Distance Ambulated (ft) 500 ft  Activity Response Tolerated well  Mobility Referral Yes  $Mobility charge 1 Mobility   Pt received sitting EOB. Asymptomatic throughout session. Pt left sitting EOB with Tele Sitter and all needs met.   Andrey Campanile Mobility Specialist Please contact via SecureChat or  Rehab office at [phone]

## 2022-03-05 LAB — COMPREHENSIVE METABOLIC PANEL WITH GFR
ALT: 19 U/L (ref 0–44)
AST: 21 U/L (ref 15–41)
Albumin: 3.4 g/dL — ABNORMAL LOW (ref 3.5–5.0)
Alkaline Phosphatase: 46 U/L (ref 38–126)
Anion gap: 11 (ref 5–15)
BUN: 15 mg/dL (ref 6–20)
CO2: 23 mmol/L (ref 22–32)
Calcium: 9.4 mg/dL (ref 8.9–10.3)
Chloride: 107 mmol/L (ref 98–111)
Creatinine, Ser: 1.32 mg/dL — ABNORMAL HIGH (ref 0.44–1.00)
GFR, Estimated: 48 mL/min — ABNORMAL LOW (ref >60)
Glucose, Bld: 80 mg/dL (ref 70–99)
Potassium: 4.5 mmol/L (ref 3.5–5.1)
Sodium: 141 mmol/L (ref 135–145)
Total Bilirubin: 0.2 mg/dL — ABNORMAL LOW (ref 0.3–1.2)
Total Protein: 6.8 g/dL (ref 6.5–8.1)

## 2022-03-05 MED ORDER — PROSOURCE PLUS PO LIQD
30.0000 mL | Freq: Two times a day (BID) | ORAL | Status: DC
  Administered 2022-03-05 – 2022-07-17 (×220): 30 mL via ORAL
  Filled 2022-03-05 (×216): qty 30

## 2022-03-05 NOTE — Progress Notes (Signed)
Mobility Specialist Progress Note:   03/05/22 0930  Mobility  Activity Ambulated with assistance to bathroom  Level of Assistance Standby assist, set-up cues, supervision of patient - no hands on  Assistive Device None  Distance Ambulated (ft) 30 ft  Activity Response Tolerated well  Mobility Referral Yes  $Mobility charge 1 Mobility   Pt requesting to go to BR. Required no physical assistance. Pt back in bed with all needs met, getting a phone call. Will return for ambulation.   Nelta Numbers Mobility Specialist Please contact via SecureChat or  Rehab office at [phone]

## 2022-03-05 NOTE — Plan of Care (Signed)

## 2022-03-05 NOTE — Progress Notes (Signed)
Mobility Specialist Progress Note:   03/05/22 1000  Mobility  Activity Ambulated with assistance in hallway  Level of Assistance Contact guard assist, steadying assist  Assistive Device Other (Comment) (HHA)  Distance Ambulated (ft) 500 ft  Activity Response Tolerated well  Mobility Referral Yes  $Mobility charge 1 Mobility   Pt eager for mobility session. Required only HHA for comfort. Pt back in bed with all needs met.   Nelta Numbers Mobility Specialist Please contact via SecureChat or  Rehab office at [phone]

## 2022-03-05 NOTE — TOC Progression Note (Signed)
Transition of Care [facility]) - Progression Note    Patient Details  Name: Natalie Brown MRN: [id number] Date of Birth: 08/08/1965  Transition of Care [facility]) CM/SW Contact  Mearl Latin, LCSW Phone Number: 03/05/2022, 9:20 AM  Clinical Narrative:    Awaiting to hear back from [facility].      Barriers to Discharge: Financial Resources, Inadequate or no insurance, Homeless with medical needs  Expected Discharge Plan and Services   In-house Referral: Clinical Social Work   Post Acute Care Choice: Nursing Home Living arrangements for the past 2 months: Homeless [facility]) Expected Discharge Date: 12/24/21                                     Social Determinants of Health (SDOH) Interventions    Readmission Risk Interventions     No data to display

## 2022-03-05 NOTE — TOC Progression Note (Signed)
Transition of Care [facility]) - Progression Note    Patient Details  Name: Natalie Brown MRN: [id number] Date of Birth: 02-Aug-1965  Transition of Care [facility]) CM/SW Contact  Mearl Latin, LCSW Phone Number: 03/05/2022, 9:21 AM  Clinical Narrative:    Awaiting to hear back from [place] [facility].     Barriers to Discharge: Financial Resources, Inadequate or no insurance, Homeless with medical needs  Expected Discharge Plan and Services   In-house Referral: Clinical Social Work   Post Acute Care Choice: Nursing Home Living arrangements for the past 2 months: Homeless [facility]) Expected Discharge Date: 12/24/21                                     Social Determinants of Health (SDOH) Interventions    Readmission Risk Interventions     No data to display

## 2022-03-05 NOTE — Progress Notes (Signed)
PROGRESS NOTE  Natalie Brown  DOB: [birth year]  PCP: Lavinia Sharps, NP KZS:[id number]  DOA: 12/24/2021  LOS: 69 days  Hospital Day: 72  Brief narrative: Natalie Brown is a 56 y.o. female with PMH significant for bipolar disorder, HTN who was hospitalized at [facility] 9/4, patient was brought into the hospital due to persistent altered mental status/tachycardia for admission to [facility] service.   09/07/2021, patient was admitted to behavioral health.  Treated with Zyprexa Invega and Ativan challenge for catatonia. 12/24/2021 sent to ER from [facility] for evaluation for tachycardia 9/04-9/5, 9/5>9/9, LTM EEG  no seizure. TSH stable CTA chest no PE no pneumonia RPR nonreactive bilateral LE Doppler negative for DVT Echo with normal EF  9/23 febrile secondary to Covid 19 + no pneumonia. 9/25 repeat CT brain unremarkable.  She has had prolonged hospitalization seen by psychiatry. Per psychiatry does not meet criteria to go back to behavioral health, awaiting for placement to memory care unit.   Subjective: Patient in bed, appears comfortable, denies any headache, no fever, no chest pain or pressure, no shortness of breath , no abdominal pain. No new focal weakness.   Assessment and plan:  Delirium due to multiple etiologies, acute, hyperactive, Bipolar affective disorder, current episode manic with psychotic symptoms   Initially admitted to the behavioral health hospital. Treated for psychosis, bipolar disorder, catatonia. Per last psychiatry note on 12/31/21 -  CONTINUE Haldol 1 mg every morning and 2 mg nightly; per [facility], plan to taper as tolerated CONTINUE propranolol 10 mg twice daily for anxiety and tachycardia (currently held for hypotension).  11/1, I will switch her from metoprolol to propranolol. CONTINUE atropine sublingual drops every 8 hours as needed drooling.   Neurology was consulted.  EEG negative for any seizures.  Other metabolic work-up unremarkable.Mental status seems Cram at the  time of my evaluation this morning. Patient is not on any scheduled therapy in the hospital but the patient was on Zyprexa 5 mg nightly, Trileptal 300 mg twice daily at [facility].  Patient may benefit from some scheduled medication.  Defer to psychiatry.    Early onset Dementia with behavioral disturbance  - 10/09/21, CT head showed evidence of advanced brain atrophy.  12/24/21, MRI brain also shows evidence of advanced cerebral and cerebellar atrophy. Natalie level, B1, MMA, TSH unremarkable.  Ammonia level was minimally elevated.  B12 level was minimally low but these minor were abnormalities would not explain presentation.  [name] now likely at her baseline, poor functional status with poor insight and cognition.  Sinus tachycardia - Currently on Lopressor.  We will switch to propanolol as recommended by psychiatry on 9/11.    Vitamin B12 deficiency - Relative.  MMA level normal.  Currently being replaced.  Drooling - Currently on scopolamine patch.    COVID-19 virus infection - Checked in response to fever on 9/22.  No therapy was indicated.  No evidence of pneumonia.  This problem has resolved.   Mild hypocalcemia.  Replaced will monitor.    Diet:  Diet Order             Diet regular Room service appropriate? No; Fluid consistency: Thin  Diet effective now                   DVT prophylaxis:  enoxaparin (LOVENOX) injection 40 mg Start: 12/26/21 1200   Antimicrobials: None Fluid: None currently Consultants: None Family Communication: None at bedside  Status is: Inpatient  Continue in-hospital care because: Difficult  to place Level of care: Med-Surg   Dispo: The patient is from: Home              Anticipated d/c is to: Difficult to place              Patient currently is medically stable to d/c.   Difficult to place patient Yes   Scheduled Meds:  (feeding supplement) PROSource Plus  30 mL Oral BID BM   vitamin B-12  1,000 mcg Oral Daily   docusate sodium  200 mg Oral BID    enoxaparin (LOVENOX) injection  40 mg Subcutaneous Q24H   feeding supplement (KATE FARMS STANDARD 1.4)  325 mL Oral BID BM   haloperidol  1 mg Oral BID   hydrocerin   Topical BID   polyethylene glycol  17 g Oral BID   propranolol  10 mg Oral BID   scopolamine  1 patch Transdermal Q72H    PRN meds: acetaminophen, alum & mag hydroxide-simeth, fluticasone, hydrOXYzine, LORazepam, magnesium hydroxide, metoprolol tartrate, OLANZapine zydis **AND** ziprasidone, ondansetron, mouth rinse   Antimicrobials: Anti-infectives (From admission, onward)    None       Objective: Vitals:   03/04/22 2058 03/04/22 2100  BP: 109/77 109/77  Pulse:  61  Resp:  18  Temp:    SpO2:  100%   No intake or output data in the 24 hours ending 03/05/22 0927   There were no vitals filed for this visit. Weight change:  Body mass index is 25.62 kg/m.   Physical Exam:  Awake, remains confused, reading magazines, no focal deficits [place].AT,PERRAL Supple Neck, No JVD,   Symmetrical Chest wall movement, Good air movement bilaterally, CTAB RRR,No Gallops, Rubs or new Murmurs,  +ve B.Sounds, Abd Soft, No tenderness,   No Cyanosis, Clubbing or edema   Data Review: I have personally reviewed the laboratory data and studies available.  Recent Labs  Lab 03/04/22 0305  WBC 4.9  HGB 12.8  HCT 39.7  PLT 278  MCV 91.5  MCH 29.5  MCHC 32.2  RDW 13.2    Recent Labs  Lab 03/04/22 0305 03/05/22 0340  NA 138 141  K 3.8 4.5  MG 2.1  --   CL 103 107  CO2 26 23  GLUCOSE 112* 80  BUN 13 15  CREATININE 1.15* 1.32*  CALCIUM 8.6* 9.4  AST  --  21  ALT  --  19  ALKPHOS  --  46  BILITOT  --  0.2*  ALBUMIN  --  3.4*      Signature  Susa Raring M.D on 03/05/2022 at 9:27 AM   -  To page go to [email or web address]

## 2022-03-05 NOTE — TOC Progression Note (Signed)
Transition of Care [facility]) - Progression Note    Patient Details  Name: Natalie Brown MRN: [id number] Date of Birth: Apr 09, 1966  Transition of Care [facility]) CM/SW Contact  Mearl Latin, LCSW Phone Number: 03/05/2022, 9:19 AM  Clinical Narrative:    CSW left voicemail for Inetta Fermo at [facility].      Barriers to Discharge: Financial Resources, Inadequate or no insurance, Homeless with medical needs  Expected Discharge Plan and Services   In-house Referral: Clinical Social Work   Post Acute Care Choice: Nursing Home Living arrangements for the past 2 months: Homeless [facility]) Expected Discharge Date: 12/24/21                                     Social Determinants of Health (SDOH) Interventions    Readmission Risk Interventions     No data to display

## 2022-03-06 NOTE — Progress Notes (Signed)
PROGRESS NOTE  Natalie Brown  DOB: [birth year]  PCP: Lavinia Sharps, NP KYH:[id number]  DOA: 12/24/2021  LOS: 70 days  Hospital Day: 73  Brief narrative: Natalie Brown is a 56 y.o. female with PMH significant for bipolar disorder, HTN who was hospitalized at [facility] 9/4, patient was brought into the hospital due to persistent altered mental status/tachycardia for admission to [facility] service.   09/07/2021, patient was admitted to behavioral health.  Treated with Zyprexa Invega and Ativan challenge for catatonia. 12/24/2021 sent to ER from [facility] for evaluation for tachycardia 9/04-9/5, 9/5>9/9, LTM EEG  no seizure. TSH stable CTA chest no PE no pneumonia RPR nonreactive bilateral LE Doppler negative for DVT Echo with normal EF  9/23 febrile secondary to Covid 19 + no pneumonia. 9/25 repeat CT brain unremarkable.  She has had prolonged hospitalization seen by psychiatry. Per psychiatry does not meet criteria to go back to behavioral health, awaiting for placement to memory care unit.   Subjective:   No significant events overnight as discussed with staff, patient denies any complaints    Assessment and plan:  Delirium due to multiple etiologies, acute, hyperactive, Bipolar affective disorder, current episode manic with psychotic symptoms   Initially admitted to the behavioral health hospital. Treated for psychosis, bipolar disorder, catatonia. Per last psychiatry note on 12/31/21 -  CONTINUE Haldol 1 mg every morning and 2 mg nightly; per [facility], plan to taper as tolerated CONTINUE propranolol 10 mg twice daily for anxiety and tachycardia (currently held for hypotension).  11/1, I will switch her from metoprolol to propranolol. CONTINUE atropine sublingual drops every 8 hours as needed drooling.   Neurology was consulted.  EEG negative for any seizures.  Other metabolic work-up unremarkable.Mental status seems Cram at the time of my evaluation this morning. Patient is not on any scheduled  therapy in the hospital but the patient was on Zyprexa 5 mg nightly, Trileptal 300 mg twice daily at [facility].  Patient may benefit from some scheduled medication.  Defer to psychiatry.    Early onset Dementia with behavioral disturbance  - 10/09/21, CT head showed evidence of advanced brain atrophy.  12/24/21, MRI brain also shows evidence of advanced cerebral and cerebellar atrophy. Copper level, B1, MMA, TSH unremarkable.  Ammonia level was minimally elevated.  B12 level was minimally low but these minor were abnormalities would not explain presentation.  [name] now likely at her baseline, poor functional status with poor insight and cognition.  Sinus tachycardia - Currently on Lopressor.  We will switch to propanolol as recommended by psychiatry on 9/11.    Vitamin B12 deficiency - Relative.  MMA level normal.  Currently being replaced.  Drooling - Currently on scopolamine patch.    COVID-19 virus infection - Checked in response to fever on 9/22.  No therapy was indicated.  No evidence of pneumonia.  This problem has resolved.   Mild hypocalcemia.  Replaced will monitor.    Diet:  Diet Order             Diet regular Room service appropriate? No; Fluid consistency: Thin  Diet effective now                   DVT prophylaxis:  enoxaparin (LOVENOX) injection 40 mg Start: 12/26/21 1200   Antimicrobials: None Fluid: None currently Consultants: None Family Communication: None at bedside  Status is: Inpatient  Continue in-hospital care because: Difficult to place Level of care: Med-Surg   Dispo: The patient is  from: Home              Anticipated d/c is to: Difficult to place              Patient currently is medically stable to d/c.   Difficult to place patient Yes   Scheduled Meds:  (feeding supplement) PROSource Plus  30 mL Oral BID BM   vitamin B-12  1,000 mcg Oral Daily   docusate sodium  200 mg Oral BID   enoxaparin (LOVENOX) injection  40 mg Subcutaneous Q24H   feeding  supplement (KATE FARMS STANDARD 1.4)  325 mL Oral BID BM   haloperidol  1 mg Oral BID   hydrocerin   Topical BID   polyethylene glycol  17 g Oral BID   propranolol  10 mg Oral BID   scopolamine  1 patch Transdermal Q72H    PRN meds: acetaminophen, alum & mag hydroxide-simeth, fluticasone, hydrOXYzine, LORazepam, magnesium hydroxide, metoprolol tartrate, OLANZapine zydis **AND** ziprasidone, ondansetron, mouth rinse   Antimicrobials: Anti-infectives (From admission, onward)    None       Objective: Vitals:   03/06/22 0440 03/06/22 1229  BP: 109/61 99/80  Pulse: 80 99  Resp: 18 17  Temp: 98.2 F (36.8 C) 97.7 F (36.5 C)  SpO2: 95% 100%    Intake/Output Summary (Last 24 hours) at 03/06/2022 1400 Last data filed at 03/06/2022 1037 Gross per 24 hour  Intake 1200 ml  Output --  Net 1200 ml     Filed Weights   02/03/22 2025  Weight: 72 kg   Weight change:  Body mass index is 25.62 kg/m.   Physical Exam:  Awake, confused, no focal deficits, good air entry bilaterally, regular rate and rhythm, extremities with no edema  Data Review: I have personally reviewed the laboratory data and studies available.  Recent Labs  Lab 03/04/22 0305  WBC 4.9  HGB 12.8  HCT 39.7  PLT 278  MCV 91.5  MCH 29.5  MCHC 32.2  RDW 13.2    Recent Labs  Lab 03/04/22 0305 03/05/22 0340  NA 138 141  K 3.8 4.5  MG 2.1  --   CL 103 107  CO2 26 23  GLUCOSE 112* 80  BUN 13 15  CREATININE 1.15* 1.32*  CALCIUM 8.6* 9.4  AST  --  21  ALT  --  19  ALKPHOS  --  46  BILITOT  --  0.2*  ALBUMIN  --  3.4*      Signature  Huey Bienenstock M.D on 03/06/2022 at 2:00 PM   -  To page go to [email or web address]

## 2022-03-06 NOTE — Progress Notes (Signed)
Mobility Specialist Progress Note:   03/06/22 0925  Mobility  Activity Ambulated with assistance in hallway  Level of Assistance Contact guard assist, steadying assist  Assistive Device Other (Comment) (HHA)  Distance Ambulated (ft) 500 ft  Activity Response Tolerated well  Mobility Referral Yes  $Mobility charge 1 Mobility   Pt eager for mobility session. Required only HHA for comfort. Pt back in bed with all needs met.  Nelta Numbers Mobility Specialist Please contact via SecureChat or  Rehab office at [phone]

## 2022-03-06 NOTE — Progress Notes (Signed)
Mobility Specialist Progress Note:   03/06/22 1010  Mobility  Activity Ambulated with assistance in hallway  Level of Assistance Contact guard assist, steadying assist  Assistive Device Other (Comment) (HHA)  Distance Ambulated (ft) 500 ft  Activity Response Tolerated well  Mobility Referral Yes  $Mobility charge 1 Mobility   Pt requesting to go to BR, once out, pt pulling on writer's arm adamant about another walk. Pt tolerated ambulation well, asx throughout. Back in bed with all needs met. Bed alarm on.  Nelta Numbers Mobility Specialist Please contact via SecureChat or  Rehab office at [phone]

## 2022-03-06 NOTE — TOC Progression Note (Signed)
Transition of Care [facility]) - Progression Note    Patient Details  Name: MADGE THERRIEN MRN: [id number] Date of Birth: Apr 04, 1966  Transition of Care [facility]) CM/SW Contact  Mearl Latin, LCSW Phone Number: 03/06/2022, 1:56 PM  Clinical Narrative:    CSW contacted Inetta Fermo to check status. She stated she is checking on Medicaid special assistance.      Barriers to Discharge: Financial Resources, Inadequate or no insurance, Homeless with medical needs  Expected Discharge Plan and Services   In-house Referral: Clinical Social Work   Post Acute Care Choice: Nursing Home Living arrangements for the past 2 months: Homeless [facility]) Expected Discharge Date: 12/24/21                                     Social Determinants of Health (SDOH) Interventions    Readmission Risk Interventions     No data to display

## 2022-03-06 NOTE — Progress Notes (Addendum)
Patient very irritable and not redirectable at this time, stating she needs to leave to do laundry. When trying to redirect patient she started to shove and scratch to leave the room. Patient attempting to leave unit and walked off floor to the elevators. She was escorted back by charge nurse Denny Peon and other staff nurses. 2 mg of IM haldol given at this time, MD ordered one time dose. Will continue to monitor.

## 2022-03-07 NOTE — Progress Notes (Signed)
Mobility Specialist Progress Note:   03/07/22 0945  Mobility  Activity Ambulated with assistance in hallway  Level of Assistance Contact guard assist, steadying assist  Assistive Device Other (Comment) (HHA)  Distance Ambulated (ft) 500 ft  Activity Response Tolerated well  Mobility Referral Yes  $Mobility charge 1 Mobility   Pt agreeable to mobility session. Not conversing much this am. Required only HHA for comfort. Pt back in bed with all needs met, playing games on tablet.   Nelta Numbers Mobility Specialist Please contact via SecureChat or  Rehab office at [phone]

## 2022-03-07 NOTE — Progress Notes (Signed)
PROGRESS NOTE  Natalie Brown  DOB: [birth year]  PCP: Lavinia Sharps, NP NIO:[id number]  DOA: 12/24/2021  LOS: 71 days  Hospital Day: 74  Brief narrative: Natalie Brown is a 56 y.o. female with PMH significant for bipolar disorder, HTN who was hospitalized at [facility] 9/4, patient was brought into the hospital due to persistent altered mental status/tachycardia for admission to [facility] service.   09/07/2021, patient was admitted to behavioral health.  Treated with Zyprexa Invega and Ativan challenge for catatonia. 12/24/2021 sent to ER from [facility] for evaluation for tachycardia 9/04-9/5, 9/5>9/9, LTM EEG  no seizure. TSH stable CTA chest no PE no pneumonia RPR nonreactive bilateral LE Doppler negative for DVT Echo with normal EF  9/23 febrile secondary to Covid 19 + no pneumonia. 9/25 repeat CT brain unremarkable.  She has had prolonged hospitalization seen by psychiatry. Per psychiatry does not meet criteria to go back to behavioral health, awaiting for placement to memory care unit.   Subjective:   No significant events overnight as discussed with staff, patient denies any complaints    Assessment and plan:  Delirium due to multiple etiologies, acute, hyperactive, Bipolar affective disorder, current episode manic with psychotic symptoms   Initially admitted to the behavioral health hospital. Treated for psychosis, bipolar disorder, catatonia. Per last psychiatry note on 12/31/21 -  CONTINUE Haldol 1 mg every morning and 2 mg nightly; per [facility], plan to taper as tolerated CONTINUE propranolol 10 mg twice daily for anxiety and tachycardia (currently held for hypotension).  11/1, I will switch her from metoprolol to propranolol. CONTINUE atropine sublingual drops every 8 hours as needed drooling.   Neurology was consulted.  EEG negative for any seizures.  Other metabolic work-up unremarkable.Mental status seems Cram at the time of my evaluation this morning. Patient is not on any scheduled  therapy in the hospital but the patient was on Zyprexa 5 mg nightly, Trileptal 300 mg twice daily at [facility].  Patient may benefit from some scheduled medication.  Defer to psychiatry.    Early onset Dementia with behavioral disturbance  - 10/09/21, CT head showed evidence of advanced brain atrophy.  12/24/21, MRI brain also shows evidence of advanced cerebral and cerebellar atrophy. Copper level, B1, MMA, TSH unremarkable.  Ammonia level was minimally elevated.  B12 level was minimally low but these minor were abnormalities would not explain presentation.  [name] now likely at her baseline, poor functional status with poor insight and cognition.  Sinus tachycardia - Currently on Lopressor.  We will switch to propanolol as recommended by psychiatry on 9/11.    Vitamin B12 deficiency - Relative.  MMA level normal.  Currently being replaced.  Drooling - Currently on scopolamine patch.    COVID-19 virus infection - Checked in response to fever on 9/22.  No therapy was indicated.  No evidence of pneumonia.  This problem has resolved.   Mild hypocalcemia.  Replaced will monitor.    Diet:  Diet Order             Diet regular Room service appropriate? No; Fluid consistency: Thin  Diet effective now                   DVT prophylaxis:  enoxaparin (LOVENOX) injection 40 mg Start: 12/26/21 1200   Antimicrobials: None Fluid: None currently Consultants: None Family Communication: None at bedside  Status is: Inpatient  Continue in-hospital care because: Difficult to place Level of care: Med-Surg   Dispo: The patient is  from: Home              Anticipated d/c is to: Difficult to place              Patient currently is medically stable to d/c.   Difficult to place patient Yes   Scheduled Meds:  (feeding supplement) PROSource Plus  30 mL Oral BID BM   vitamin B-12  1,000 mcg Oral Daily   docusate sodium  200 mg Oral BID   enoxaparin (LOVENOX) injection  40 mg Subcutaneous Q24H   feeding  supplement (KATE FARMS STANDARD 1.4)  325 mL Oral BID BM   haloperidol  1 mg Oral BID   hydrocerin   Topical BID   polyethylene glycol  17 g Oral BID   propranolol  10 mg Oral BID   scopolamine  1 patch Transdermal Q72H    PRN meds: acetaminophen, alum & mag hydroxide-simeth, fluticasone, hydrOXYzine, LORazepam, magnesium hydroxide, metoprolol tartrate, OLANZapine zydis **AND** ziprasidone, ondansetron, mouth rinse   Antimicrobials: Anti-infectives (From admission, onward)    None       Objective: Vitals:   03/07/22 0915 03/07/22 1300  BP: 108/71 107/68  Pulse: 74   Resp: 18 17  Temp: 98 F (36.7 C) 97.7 F (36.5 C)  SpO2: 98%    No intake or output data in the 24 hours ending 03/07/22 1511    Filed Weights   02/03/22 2025  Weight: 72 kg   Weight change:  Body mass index is 25.62 kg/m.   Physical Exam:  Awake, sitting at the edge of the bed, eating breakfast, and good air entry posteriorly   Data Review: I have personally reviewed the laboratory data and studies available.  Recent Labs  Lab 03/04/22 0305  WBC 4.9  HGB 12.8  HCT 39.7  PLT 278  MCV 91.5  MCH 29.5  MCHC 32.2  RDW 13.2    Recent Labs  Lab 03/04/22 0305 03/05/22 0340  NA 138 141  K 3.8 4.5  MG 2.1  --   CL 103 107  CO2 26 23  GLUCOSE 112* 80  BUN 13 15  CREATININE 1.15* 1.32*  CALCIUM 8.6* 9.4  AST  --  21  ALT  --  19  ALKPHOS  --  46  BILITOT  --  0.2*  ALBUMIN  --  3.4*      Signature  Huey Bienenstock M.D on 03/07/2022 at 3:11 PM   -  To page go to [email or web address]

## 2022-03-08 NOTE — Progress Notes (Signed)
Mobility Specialist Progress Note:   03/08/22 0920  Mobility  Activity Ambulated with assistance in hallway  Level of Assistance Contact guard assist, steadying assist  Assistive Device Other (Comment) (HHA)  Distance Ambulated (ft) 500 ft  Activity Response Tolerated well  Mobility Referral Yes  $Mobility charge 1 Mobility   Pt agreeable to mobility session. Only HHA required for comfort. Pt back in bed with all needs met, bed alarm on.  Nelta Numbers Mobility Specialist Please contact via SecureChat or  Rehab office at [phone]

## 2022-03-08 NOTE — Progress Notes (Signed)
Mobility Specialist Progress Note:   03/08/22 1400  Mobility  Activity Ambulated with assistance in hallway  Level of Assistance Contact guard assist, steadying assist  Assistive Device Other (Comment) (HHA)  Distance Ambulated (ft) 500 ft  Activity Response Tolerated well  Mobility Referral Yes  $Mobility charge 1 Mobility   Pt received ambulating in hallway and requesting to walk with Mobility Specialist. Pt asymptomatic throughout ambulation. Pt left in bed with all needs met and Tele Sitter on.   Andrey Campanile Mobility Specialist Please contact via SecureChat or  Rehab office at [phone]

## 2022-03-08 NOTE — TOC Progression Note (Signed)
Transition of Care [facility]) - Progression Note    Patient Details  Name: Natalie Brown MRN: [id number] Date of Birth: 14-Nov-1965  Transition of Care [facility]) CM/SW Contact  Mearl Latin, LCSW Phone Number: 03/08/2022, 4:12 PM  Clinical Narrative:    Still awaiting approval from [facility].      Barriers to Discharge: Financial Resources, Inadequate or no insurance, Homeless with medical needs  Expected Discharge Plan and Services   In-house Referral: Clinical Social Work   Post Acute Care Choice: Nursing Home Living arrangements for the past 2 months: Homeless [facility]) Expected Discharge Date: 12/24/21                                     Social Determinants of Health (SDOH) Interventions    Readmission Risk Interventions     No data to display

## 2022-03-08 NOTE — Progress Notes (Addendum)
PROGRESS NOTE  Natalie Brown  DOB: October 04, 1965  PCP: Lavinia Sharps, NP IRW:[id number]  DOA: 12/24/2021  LOS: 72 days  Hospital Day: 75  Brief narrative: ALANYS Brown is a 56 y.o. female with PMH significant for bipolar disorder, HTN who was hospitalized at [facility] 9/4, patient was brought into the hospital due to persistent altered mental status/tachycardia for admission to [facility] service.   09/07/2021, patient was admitted to behavioral health.  Treated with Zyprexa Invega and Ativan challenge for catatonia. 12/24/2021 sent to ER from [facility] for evaluation for tachycardia 9/04-9/5, 9/5>9/9, LTM EEG  no seizure. TSH stable CTA chest no PE no pneumonia RPR nonreactive bilateral LE Doppler negative for DVT Echo with normal EF  9/23 febrile secondary to Covid 19 + no pneumonia. 9/25 repeat CT brain unremarkable.  Natalie Brown has had prolonged hospitalization seen by psychiatry. Per psychiatry does not meet criteria to go back to behavioral health, awaiting for placement to memory care unit.   Subjective:   No significant events overnight, Natalie Brown denies any complaints.   Assessment and plan:  Delirium due to multiple etiologies, acute, hyperactive, Bipolar affective disorder, current episode manic with psychotic symptoms   Initially admitted to the behavioral health hospital. Treated for psychosis, bipolar disorder, catatonia. Per last psychiatry note on 12/31/21 -  CONTINUE Haldol 1 mg every morning and 2 mg nightly; per [facility], plan to taper as tolerated CONTINUE propranolol 10 mg twice daily for anxiety and tachycardia (currently held for hypotension).  11/1, I will switch her from metoprolol to propranolol. CONTINUE atropine sublingual drops every 8 hours as needed drooling.   Neurology was consulted.  EEG negative for any seizures.  Other metabolic work-up unremarkable.Mental status seems Cram at the time of my evaluation this morning. Patient is not on any scheduled therapy in the hospital but  the patient was on Zyprexa 5 mg nightly, Trileptal 300 mg twice daily at [facility].  Patient may benefit from some scheduled medication.  Defer to psychiatry.    Early onset Dementia with behavioral disturbance  - 10/09/21, CT head showed evidence of advanced brain atrophy.  12/24/21, MRI brain also shows evidence of advanced cerebral and cerebellar atrophy. Copper level, B1, MMA, TSH unremarkable.  Ammonia level was minimally elevated.  B12 level was minimally low but these minor were abnormalities would not explain presentation.  Natalie Brown is now likely at her baseline, poor functional status with poor insight and cognition.  Sinus tachycardia - Currently on Lopressor.  We will switch to propanolol as recommended by psychiatry on 9/11.    Vitamin B12 deficiency - Relative.  MMA level normal.  Currently being replaced.  Drooling - Currently on scopolamine patch.    COVID-19 virus infection - Checked in response to fever on 9/22.  No therapy was indicated.  No evidence of pneumonia.  This problem has resolved.   Mild hypocalcemia.  Replaced will monitor.    Diet:  Diet Order             Diet regular Room service appropriate? No; Fluid consistency: Thin  Diet effective now                   DVT prophylaxis:  enoxaparin (LOVENOX) injection 40 mg Start: 12/26/21 1200   Antimicrobials: None Fluid: None currently Consultants: None Family Communication: None at bedside  Status is: Inpatient  Continue in-hospital care because: Difficult to place Level of care: Med-Surg   Dispo: The patient is from:  Anticipated d/c is to: Difficult to place              Patient currently is medically stable to d/c.   Difficult to place patient Yes   Scheduled Meds:  (feeding supplement) PROSource Plus  30 mL Oral BID BM   vitamin B-12  1,000 mcg Oral Daily   docusate sodium  200 mg Oral BID   enoxaparin (LOVENOX) injection  40 mg Subcutaneous Q24H   feeding supplement (KATE FARMS STANDARD  1.4)  325 mL Oral BID BM   haloperidol  1 mg Oral BID   hydrocerin   Topical BID   polyethylene glycol  17 g Oral BID   propranolol  10 mg Oral BID   scopolamine  1 patch Transdermal Q72H    PRN meds: acetaminophen, alum & mag hydroxide-simeth, fluticasone, hydrOXYzine, LORazepam, magnesium hydroxide, metoprolol tartrate, OLANZapine zydis **AND** ziprasidone, ondansetron, mouth rinse   Antimicrobials: Anti-infectives (From admission, onward)    None       Objective: Vitals:   03/07/22 1300 03/08/22 0558  BP: 107/68 122/87  Pulse:  88  Resp: 17 18  Temp: 97.7 F (36.5 C) 98 F (36.7 C)  SpO2:  98%   No intake or output data in the 24 hours ending 03/08/22 1519    Filed Weights   02/03/22 2025  Weight: 72 kg   Weight change:  Body mass index is 25.62 kg/m.   Physical Exam:  Awake, in no apparent distress, good air entry bilaterally    Data Review: I have personally reviewed the laboratory data and studies available.  Recent Labs  Lab 03/04/22 0305  WBC 4.9  HGB 12.8  HCT 39.7  PLT 278  MCV 91.5  MCH 29.5  MCHC 32.2  RDW 13.2    Recent Labs  Lab 03/04/22 0305 03/05/22 0340  NA 138 141  K 3.8 4.5  MG 2.1  --   CL 103 107  CO2 26 23  GLUCOSE 112* 80  BUN 13 15  CREATININE 1.15* 1.32*  CALCIUM 8.6* 9.4  AST  --  21  ALT  --  19  ALKPHOS  --  46  BILITOT  --  0.2*  ALBUMIN  --  3.4*      Signature  Phillips Climes M.D on 03/08/2022 at 3:19 PM   -  To page go to [email or web address]

## 2022-03-09 NOTE — Plan of Care (Signed)

## 2022-03-09 NOTE — Progress Notes (Signed)
Mobility Specialist Progress Note:   03/09/22 0909  Mobility  Activity Ambulated with assistance in hallway  Level of Assistance Contact guard assist, steadying assist  Assistive Device Other (Comment) (HHA)  Distance Ambulated (ft) 500 ft  Activity Response Tolerated well  Mobility Referral Yes  $Mobility charge 1 Mobility   Pt received in bed and agreeable. Pt asymptomatic throughout with HHA for comfort. Pt left sitting EOB with all needs met and sitter in room.   Andrey Campanile Mobility Specialist Please contact via SecureChat or  Rehab office at [phone]

## 2022-03-09 NOTE — Progress Notes (Signed)
PROGRESS NOTE  TAMSIN NADER  DOB: [birth year]  PCP: Lavinia Sharps, NP VHQ:[id number]  DOA: 12/24/2021  LOS: 73 days  Hospital Day: 76  Brief narrative: Natalie Brown is a 56 y.o. female with PMH significant for bipolar disorder, HTN who was hospitalized at [facility] 9/4, patient was brought into the hospital due to persistent altered mental status/tachycardia for admission to [facility] service.   09/07/2021, patient was admitted to behavioral health.  Treated with Zyprexa Invega and Ativan challenge for catatonia. 12/24/2021 sent to ER from [facility] for evaluation for tachycardia 9/04-9/5, 9/5>9/9, LTM EEG  no seizure. TSH stable CTA chest no PE no pneumonia RPR nonreactive bilateral LE Doppler negative for DVT Echo with normal EF  9/23 febrile secondary to Covid 19 + no pneumonia. 9/25 repeat CT brain unremarkable.  She has had prolonged hospitalization seen by psychiatry. Per psychiatry does not meet criteria to go back to behavioral health, awaiting for placement to memory care unit.   Subjective:   No significant events overnight, she denies any complaints.   Assessment and plan:  Delirium due to multiple etiologies, acute, hyperactive, Bipolar affective disorder, current episode manic with psychotic symptoms   Initially admitted to the behavioral health hospital. Treated for psychosis, bipolar disorder, catatonia. Per last psychiatry note on 12/31/21 -  CONTINUE Haldol 1 mg every morning and 2 mg nightly; per [facility], plan to taper as tolerated CONTINUE propranolol 10 mg twice daily for anxiety and tachycardia (currently held for hypotension).  11/1, I will switch her from metoprolol to propranolol. CONTINUE atropine sublingual drops every 8 hours as needed drooling.   Neurology was consulted.  EEG negative for any seizures.  Other metabolic work-up unremarkable.Mental status seems Cram at the time of my evaluation this morning. Patient is not on any scheduled therapy in the hospital but  the patient was on Zyprexa 5 mg nightly, Trileptal 300 mg twice daily at [facility].  Patient may benefit from some scheduled medication.  Defer to psychiatry.    Early onset Dementia with behavioral disturbance  - 10/09/21, CT head showed evidence of advanced brain atrophy.  12/24/21, MRI brain also shows evidence of advanced cerebral and cerebellar atrophy. Copper level, B1, MMA, TSH unremarkable.  Ammonia level was minimally elevated.  B12 level was minimally low but these minor were abnormalities would not explain presentation.  [name] now likely at her baseline, poor functional status with poor insight and cognition.  Sinus tachycardia - Currently on Lopressor.  We will switch to propanolol as recommended by psychiatry on 9/11.    Vitamin B12 deficiency - Relative.  MMA level normal.  Currently being replaced.  Drooling - Currently on scopolamine patch.    COVID-19 virus infection - Checked in response to fever on 9/22.  No therapy was indicated.  No evidence of pneumonia.  This problem has resolved.   Mild hypocalcemia.  Replaced will monitor.    Diet:  Diet Order             Diet regular Room service appropriate? No; Fluid consistency: Thin  Diet effective now                   DVT prophylaxis:  enoxaparin (LOVENOX) injection 40 mg Start: 12/26/21 1200   Antimicrobials: None Fluid: None currently Consultants: None Family Communication: None at bedside  Status is: Inpatient  Continue in-hospital care because: Difficult to place Level of care: Med-Surg   Dispo: The patient is from:  Anticipated d/c is to: Difficult to place              Patient currently is medically stable to d/c.   Difficult to place patient Yes   Scheduled Meds:  (feeding supplement) PROSource Plus  30 mL Oral BID BM   vitamin B-12  1,000 mcg Oral Daily   docusate sodium  200 mg Oral BID   enoxaparin (LOVENOX) injection  40 mg Subcutaneous Q24H   feeding supplement (KATE FARMS STANDARD  1.4)  325 mL Oral BID BM   haloperidol  1 mg Oral BID   hydrocerin   Topical BID   polyethylene glycol  17 g Oral BID   propranolol  10 mg Oral BID   scopolamine  1 patch Transdermal Q72H    PRN meds: acetaminophen, alum & mag hydroxide-simeth, fluticasone, hydrOXYzine, LORazepam, magnesium hydroxide, metoprolol tartrate, OLANZapine zydis **AND** ziprasidone, ondansetron, mouth rinse   Antimicrobials: Anti-infectives (From admission, onward)    None       Objective: Vitals:   03/08/22 2030 03/09/22 1244  BP: 128/79 (!) 102/59  Pulse: 95 92  Resp: 20 20  Temp: (!) 97.3 F (36.3 C) 97.9 F (36.6 C)  SpO2:  99%    Intake/Output Summary (Last 24 hours) at 03/09/2022 1350 Last data filed at 03/09/2022 1235 Gross per 24 hour  Intake 480 ml  Output --  Net 480 ml      Filed Weights   02/03/22 2025  Weight: 72 kg   Weight change:  Body mass index is 25.62 kg/m.   Physical Exam:  Awake, no apparent distress, good air entry bilaterally, lower extremity with no edema.   Data Review: I have personally reviewed the laboratory data and studies available.  Recent Labs  Lab 03/04/22 0305  WBC 4.9  HGB 12.8  HCT 39.7  PLT 278  MCV 91.5  MCH 29.5  MCHC 32.2  RDW 13.2    Recent Labs  Lab 03/04/22 0305 03/05/22 0340  NA 138 141  K 3.8 4.5  MG 2.1  --   CL 103 107  CO2 26 23  GLUCOSE 112* 80  BUN 13 15  CREATININE 1.15* 1.32*  CALCIUM 8.6* 9.4  AST  --  21  ALT  --  19  ALKPHOS  --  46  BILITOT  --  0.2*  ALBUMIN  --  3.4*      Signature  Huey Bienenstock M.D on 03/09/2022 at 1:50 PM   -  To page go to [email or web address]

## 2022-03-10 NOTE — Progress Notes (Signed)
PROGRESS NOTE  Natalie Brown  DOB: 08-02-1965  PCP: Marliss Coots, NP [id number]  DOA: 12/24/2021  LOS: 37 days  Hospital Day: 77  Brief narrative: Natalie Brown is a 56 y.o. female with PMH significant for bipolar disorder, HTN who was hospitalized at [facility] 9/4, patient was brought into the hospital due to persistent altered mental status/tachycardia for admission to [facility] service.   09/07/2021, patient was admitted to behavioral health.  Treated with Zyprexa Invega and Ativan challenge for catatonia. 12/24/2021 sent to ER from [facility] for evaluation for tachycardia 9/04-9/5, 9/5>9/9, LTM EEG  no seizure. TSH stable CTA chest no PE no pneumonia RPR nonreactive bilateral LE Doppler negative for DVT Echo with normal EF  9/23 febrile secondary to Covid 19 + no pneumonia. 9/25 repeat CT brain unremarkable.  She has had prolonged hospitalization seen by psychiatry. Per psychiatry does not meet criteria to go back to behavioral health, awaiting for placement to memory care unit.   Subjective:   No significant events overnight, she denies any complaints.   Assessment and plan:  Delirium due to multiple etiologies, acute, hyperactive, Bipolar affective disorder, current episode manic with psychotic symptoms   Initially admitted to the behavioral health hospital. Treated for psychosis, bipolar disorder, catatonia. Per last psychiatry note on 12/31/21 -  CONTINUE Haldol 1 mg every morning and 2 mg nightly; per [facility], plan to taper as tolerated CONTINUE propranolol 10 mg twice daily for anxiety and tachycardia (currently held for hypotension).  11/1, I will switch her from metoprolol to propranolol. CONTINUE atropine sublingual drops every 8 hours as needed drooling.   Neurology was consulted.  EEG negative for any seizures.  Other metabolic work-up unremarkable.Mental status seems Cram at the time of my evaluation this morning. Patient is not on any scheduled therapy in the hospital but  the patient was on Zyprexa 5 mg nightly, Trileptal 300 mg twice daily at [facility].  Patient may benefit from some scheduled medication.  Defer to psychiatry.    Early onset Dementia with behavioral disturbance  - 10/09/21, CT head showed evidence of advanced brain atrophy.  12/24/21, MRI brain also shows evidence of advanced cerebral and cerebellar atrophy. Copper level, B1, MMA, TSH unremarkable.  Ammonia level was minimally elevated.  B12 level was minimally low but these minor were abnormalities would not explain presentation.  [name] now likely at her baseline, poor functional status with poor insight and cognition.  Sinus tachycardia - Currently on Lopressor.  We will switch to propanolol as recommended by psychiatry on 9/11.    Vitamin B12 deficiency - Relative.  MMA level normal.  Currently being replaced.  Drooling - Currently on scopolamine patch.    COVID-19 virus infection - Checked in response to fever on 9/22.  No therapy was indicated.  No evidence of pneumonia.  This problem has resolved.   Mild hypocalcemia.  Replaced will monitor.    Diet:  Diet Order             Diet regular Room service appropriate? No; Fluid consistency: Thin  Diet effective now                   DVT prophylaxis:  enoxaparin (LOVENOX) injection 40 mg Start: 12/26/21 1200   Antimicrobials: None Fluid: None currently Consultants: None Family Communication: None at bedside  Status is: Inpatient  Continue in-hospital care because: Difficult to place Level of care: Med-Surg   Dispo: The patient is from:  Anticipated d/c is to: Difficult to place              Patient currently is medically stable to d/c.   Difficult to place patient Yes   Scheduled Meds:  (feeding supplement) PROSource Plus  30 mL Oral BID BM   vitamin B-12  1,000 mcg Oral Daily   docusate sodium  200 mg Oral BID   enoxaparin (LOVENOX) injection  40 mg Subcutaneous Q24H   feeding supplement (KATE FARMS STANDARD  1.4)  325 mL Oral BID BM   haloperidol  1 mg Oral BID   hydrocerin   Topical BID   polyethylene glycol  17 g Oral BID   propranolol  10 mg Oral BID   scopolamine  1 patch Transdermal Q72H    PRN meds: acetaminophen, alum & mag hydroxide-simeth, fluticasone, hydrOXYzine, LORazepam, magnesium hydroxide, metoprolol tartrate, OLANZapine zydis **AND** ziprasidone, ondansetron, mouth rinse   Antimicrobials: Anti-infectives (From admission, onward)    None       Objective: Vitals:   03/09/22 1244 03/09/22 2013  BP: (!) 102/59 (!) 105/93  Pulse: 92 95  Resp: 20 20  Temp: 97.9 F (36.6 C) 98.2 F (36.8 C)  SpO2: 99% 98%   No intake or output data in the 24 hours ending 03/10/22 1330     Filed Weights   02/03/22 2025  Weight: 72 kg   Weight change:  Body mass index is 25.62 kg/m.   Physical Exam:  Awake, sitting at the edge of the bed, eating breakfast, good air entry bilaterally, no edema.     Data Review: I have personally reviewed the laboratory data and studies available.  Recent Labs  Lab 03/04/22 0305  WBC 4.9  HGB 12.8  HCT 39.7  PLT 278  MCV 91.5  MCH 29.5  MCHC 32.2  RDW 13.2    Recent Labs  Lab 03/04/22 0305 03/05/22 0340  NA 138 141  K 3.8 4.5  MG 2.1  --   CL 103 107  CO2 26 23  GLUCOSE 112* 80  BUN 13 15  CREATININE 1.15* 1.32*  CALCIUM 8.6* 9.4  AST  --  21  ALT  --  19  ALKPHOS  --  46  BILITOT  --  0.2*  ALBUMIN  --  3.4*      Signature  Huey Bienenstock M.D on 03/10/2022 at 1:30 PM   -  To page go to [email or web address]

## 2022-03-10 NOTE — Progress Notes (Signed)
Mobility Specialist Progress Note:   03/10/22 1101  Mobility  Activity Ambulated with assistance in hallway  Level of Assistance Contact guard assist, steadying assist  Assistive Device Other (Comment) (HHA)  Distance Ambulated (ft) 500 ft  Activity Response Tolerated well  Mobility Referral Yes  $Mobility charge 1 Mobility   Pt received in bed and agreeable. Asymptomatic throughout. Pt left in bed with all needs met, call bell in reach, and bed alarm on.   Andrey Campanile Mobility Specialist Please contact via SecureChat or  Rehab office at [phone]

## 2022-03-11 LAB — COMPREHENSIVE METABOLIC PANEL
ALT: 19 U/L (ref 0–44)
AST: 22 U/L (ref 15–41)
Albumin: 3.6 g/dL (ref 3.5–5.0)
Alkaline Phosphatase: 52 U/L (ref 38–126)
Anion gap: 10 (ref 5–15)
BUN: 10 mg/dL (ref 6–20)
CO2: 27 mmol/L (ref 22–32)
Calcium: 9.3 mg/dL (ref 8.9–10.3)
Chloride: 104 mmol/L (ref 98–111)
Creatinine, Ser: 1.09 mg/dL — ABNORMAL HIGH (ref 0.44–1.00)
GFR, Estimated: >60 mL/min (ref >60)
Glucose, Bld: 109 mg/dL — ABNORMAL HIGH (ref 70–99)
Potassium: 4.6 mmol/L (ref 3.5–5.1)
Sodium: 141 mmol/L (ref 135–145)
Total Bilirubin: 0.5 mg/dL (ref 0.3–1.2)
Total Protein: 7.1 g/dL (ref 6.5–8.1)

## 2022-03-11 LAB — CBC
HCT: 49.7 % — ABNORMAL HIGH (ref 36.0–46.0)
Hemoglobin: 16.6 g/dL — ABNORMAL HIGH (ref 12.0–15.0)
MCH: 29.7 pg (ref 26.0–34.0)
MCHC: 33.4 g/dL (ref 30.0–36.0)
MCV: 88.9 fL (ref 80.0–100.0)
Platelets: 187 10*3/uL (ref 150–400)
RBC: 5.59 MIL/uL — ABNORMAL HIGH (ref 3.87–5.11)
RDW: 13.2 % (ref 11.5–15.5)
WBC: 3.4 10*3/uL — ABNORMAL LOW (ref 4.0–10.5)
nRBC: 0 % (ref 0.0–0.2)

## 2022-03-11 LAB — MAGNESIUM: Magnesium: 2.2 mg/dL (ref 1.7–2.4)

## 2022-03-11 NOTE — TOC Progression Note (Signed)
Transition of Care [facility]) - Progression Note    Patient Details  Name: TELESHA DEGUZMAN MRN: [id number] Date of Birth: [birth year]  Transition of Care [facility]) CM/SW Contact  Mearl Latin, LCSW Phone Number: 03/11/2022, 2:50 PM  Clinical Narrative:    2:50pm-CSW spoke with Inetta Fermo at [facility]. She stated she has not heard back from patient's mother but that she will call Medicaid now. CSW left voicemail for patient's mother.  2:54pm-CSW received call back from [place]. She stated that she spoke with Jody at DSS and patient cannot be approved for special assistance Medicaid because she does not have any income and has not been approved for Disability yet. They do not accept LOGs from the hospital.      Barriers to Discharge: Financial Resources, Inadequate or no insurance, Homeless with medical needs  Expected Discharge Plan and Services   In-house Referral: Clinical Social Work   Post Acute Care Choice: Nursing Home Living arrangements for the past 2 months: Homeless [facility]) Expected Discharge Date: 12/24/21                                     Social Determinants of Health (SDOH) Interventions    Readmission Risk Interventions     No data to display

## 2022-03-11 NOTE — Progress Notes (Signed)
Mobility Specialist Progress Note:   03/11/22 0953  Mobility  Activity Ambulated with assistance in hallway  Level of Assistance Contact guard assist, steadying assist  Assistive Device Other (Comment) (HHA)  Distance Ambulated (ft) 500 ft  Activity Response Tolerated well  Mobility Referral Yes  $Mobility charge 1 Mobility   Pt received in bed and agreeable. Asymptomatic throughout. Pt left in bed with all needs met, call bell in reach, and bed alarm on.   Andrey Campanile Mobility Specialist Please contact via SecureChat or  Rehab office at [phone]

## 2022-03-11 NOTE — Plan of Care (Signed)

## 2022-03-11 NOTE — Plan of Care (Signed)
  Problem: Clinical Measurements: Goal: Ability to maintain clinical measurements within normal limits will improve Outcome: Progressing   Problem: Nutrition: Goal: Adequate nutrition will be maintained Outcome: Progressing   

## 2022-03-11 NOTE — Progress Notes (Signed)
PROGRESS NOTE  Natalie Brown  DOB: [birth year]  PCP: Lavinia Sharps, NP WNI:[id number]  DOA: 12/24/2021  LOS: 75 days  Hospital Day: 78  Brief narrative: Natalie Brown is a 56 y.o. female with PMH significant for bipolar disorder, HTN who was hospitalized at [facility] 9/4, patient was brought into the hospital due to persistent altered mental status/tachycardia for admission to [facility] service.   09/07/2021, patient was admitted to behavioral health.  Treated with Zyprexa Invega and Ativan challenge for catatonia. 12/24/2021 sent to ER from [facility] for evaluation for tachycardia 9/04-9/5, 9/5>9/9, LTM EEG  no seizure. TSH stable CTA chest no PE no pneumonia RPR nonreactive bilateral LE Doppler negative for DVT Echo with normal EF  9/23 febrile secondary to Covid 19 + no pneumonia. 9/25 repeat CT brain unremarkable.  She has had prolonged hospitalization seen by psychiatry. Per psychiatry does not meet criteria to go back to behavioral health, awaiting for placement to memory care unit.   Subjective:  No significant events overnight, she denies any complaints today.   Assessment and plan:  Delirium due to multiple etiologies, acute, hyperactive, Bipolar affective disorder, current episode manic with psychotic symptoms   Initially admitted to the behavioral health hospital. Treated for psychosis, bipolar disorder, catatonia. Per last psychiatry note on 12/31/21 -  CONTINUE Haldol 1 mg every morning and 2 mg nightly; per [facility], plan to taper as tolerated CONTINUE propranolol 10 mg twice daily for anxiety and tachycardia (currently held for hypotension).  11/1, I will switch her from metoprolol to propranolol. CONTINUE atropine sublingual drops every 8 hours as needed drooling.   Neurology was consulted.  EEG negative for any seizures.  Other metabolic work-up unremarkable.Mental status seems Cram at the time of my evaluation this morning. Patient is not on any scheduled therapy in the hospital  but the patient was on Zyprexa 5 mg nightly, Trileptal 300 mg twice daily at [facility].  Patient may benefit from some scheduled medication.  Defer to psychiatry.    Early onset Dementia with behavioral disturbance  - 10/09/21, CT head showed evidence of advanced brain atrophy.  12/24/21, MRI brain also shows evidence of advanced cerebral and cerebellar atrophy. Copper level, B1, MMA, TSH unremarkable.  Ammonia level was minimally elevated.  B12 level was minimally low but these minor were abnormalities would not explain presentation.  [name] now likely at her baseline, poor functional status with poor insight and cognition.  Sinus tachycardia - Currently on Lopressor.  We will switch to propanolol as recommended by psychiatry on 9/11.    Vitamin B12 deficiency - Relative.  MMA level normal.  Currently being replaced.  Drooling - Currently on scopolamine patch.    COVID-19 virus infection - Checked in response to fever on 9/22.  No therapy was indicated.  No evidence of pneumonia.  This problem has resolved.   Mild hypocalcemia.  Replaced will monitor.    Diet:  Diet Order             Diet regular Room service appropriate? No; Fluid consistency: Thin  Diet effective now                   DVT prophylaxis:  enoxaparin (LOVENOX) injection 40 mg Start: 12/26/21 1200   Antimicrobials: None Fluid: None currently Consultants: None Family Communication: None at bedside  Status is: Inpatient  Continue in-hospital care because: Difficult to place Level of care: Med-Surg   Dispo: The patient is from:  Anticipated d/c is to: Difficult to place              Patient currently is medically stable to d/c.   Difficult to place patient Yes   Scheduled Meds:  (feeding supplement) PROSource Plus  30 mL Oral BID BM   vitamin B-12  1,000 mcg Oral Daily   docusate sodium  200 mg Oral BID   enoxaparin (LOVENOX) injection  40 mg Subcutaneous Q24H   feeding supplement (KATE FARMS  STANDARD 1.4)  325 mL Oral BID BM   haloperidol  1 mg Oral BID   hydrocerin   Topical BID   polyethylene glycol  17 g Oral BID   propranolol  10 mg Oral BID   scopolamine  1 patch Transdermal Q72H    PRN meds: acetaminophen, alum & mag hydroxide-simeth, fluticasone, hydrOXYzine, LORazepam, magnesium hydroxide, metoprolol tartrate, OLANZapine zydis **AND** ziprasidone, ondansetron, mouth rinse   Antimicrobials: Anti-infectives (From admission, onward)    None       Objective: Vitals:   03/11/22 0149 03/11/22 0728  BP: (!) 98/59 117/63  Pulse: (!) 58   Resp: 16   Temp:  98.8 F (37.1 C)  SpO2: 100%    No intake or output data in the 24 hours ending 03/11/22 1430     Filed Weights   02/03/22 2025  Weight: 72 kg   Weight change:  Body mass index is 25.62 kg/m.   Physical Exam:  Awake, sitting at the edge of the bed, no apparent distress, good air entry bilaterally, no edema.        Data Review: I have personally reviewed the laboratory data and studies available.  Recent Labs  Lab 03/11/22 0358  WBC 3.4*  HGB 16.6*  HCT 49.7*  PLT 187  MCV 88.9  MCH 29.7  MCHC 33.4  RDW 13.2    Recent Labs  Lab 03/05/22 0340 03/11/22 0606  NA 141 141  K 4.5 4.6  MG  --  2.2  CL 107 104  CO2 23 27  GLUCOSE 80 109*  BUN 15 10  CREATININE 1.32* 1.09*  CALCIUM 9.4 9.3  AST 21 22  ALT 19 19  ALKPHOS 46 52  BILITOT 0.2* 0.5  ALBUMIN 3.4* 3.6      Signature  Huey Bienenstock M.D on 03/11/2022 at 2:30 PM   -  To page go to [email or web address]

## 2022-03-12 NOTE — Progress Notes (Signed)
Mobility Specialist Progress Note:   03/12/22 0930  Mobility  Activity Ambulated with assistance in hallway  Level of Assistance Contact guard assist, steadying assist  Assistive Device Other (Comment) (HHA)  Distance Ambulated (ft) 500 ft  Activity Response Tolerated well  Mobility Referral Yes  $Mobility charge 1 Mobility   Pt eager for mobility session. Required only HHA for comfort. Pt back in bed with all needs met.   Nelta Numbers Mobility Specialist Please contact via SecureChat or  Rehab office at [phone]

## 2022-03-12 NOTE — Progress Notes (Signed)
PROGRESS NOTE  DANNILYNN GALLINA  DOB: 1965-06-09  PCP: Lavinia Sharps, NP ZWC:[id number]  DOA: 12/24/2021  LOS: 76 days  Hospital Day: 79  Brief narrative: TERAN DAUGHENBAUGH is a 56 y.o. female with PMH significant for bipolar disorder, HTN who was hospitalized at [facility] 9/4, patient was brought into the hospital due to persistent altered mental status/tachycardia for admission to [facility] service.   09/07/2021, patient was admitted to behavioral health.  Treated with Zyprexa Invega and Ativan challenge for catatonia. 12/24/2021 sent to ER from [facility] for evaluation for tachycardia 9/04-9/5, 9/5>9/9, LTM EEG  no seizure. TSH stable CTA chest no PE no pneumonia RPR nonreactive bilateral LE Doppler negative for DVT Echo with normal EF  9/23 febrile secondary to Covid 19 + no pneumonia. 9/25 repeat CT brain unremarkable.  She has had prolonged hospitalization seen by psychiatry. Per psychiatry does not meet criteria to go back to behavioral health, awaiting for placement to memory care unit.   Subjective:  No significant events overnight.   Assessment and plan:  Delirium due to multiple etiologies, acute, hyperactive, Bipolar affective disorder, current episode manic with psychotic symptoms   Initially admitted to the behavioral health hospital. Treated for psychosis, bipolar disorder, catatonia. Per last psychiatry note on 12/31/21 -  CONTINUE Haldol 1 mg every morning and 2 mg nightly; per [facility], plan to taper as tolerated CONTINUE propranolol 10 mg twice daily for anxiety and tachycardia (currently held for hypotension).  11/1, I will switch her from metoprolol to propranolol. CONTINUE atropine sublingual drops every 8 hours as needed drooling.   Neurology was consulted.  EEG negative for any seizures.  Other metabolic work-up unremarkable.Mental status seems Cram at the time of my evaluation this morning. Patient is not on any scheduled therapy in the hospital but the patient was on Zyprexa 5 mg  nightly, Trileptal 300 mg twice daily at [facility].  Patient may benefit from some scheduled medication.  Defer to psychiatry.    Early onset Dementia with behavioral disturbance  - 10/09/21, CT head showed evidence of advanced brain atrophy.  12/24/21, MRI brain also shows evidence of advanced cerebral and cerebellar atrophy. Copper level, B1, MMA, TSH unremarkable.  Ammonia level was minimally elevated.  B12 level was minimally low but these minor were abnormalities would not explain presentation.  [name] now likely at her baseline, poor functional status with poor insight and cognition.  Sinus tachycardia - Currently on Lopressor.  We will switch to propanolol as recommended by psychiatry on 9/11.    Vitamin B12 deficiency - Relative.  MMA level normal.  Currently being replaced.  Drooling - Currently on scopolamine patch.    COVID-19 virus infection - Checked in response to fever on 9/22.  No therapy was indicated.  No evidence of pneumonia.  This problem has resolved.   Mild hypocalcemia.  Replaced will monitor.    Diet:  Diet Order             Diet regular Room service appropriate? No; Fluid consistency: Thin  Diet effective now                   DVT prophylaxis:  enoxaparin (LOVENOX) injection 40 mg Start: 12/26/21 1200   Antimicrobials: None Fluid: None currently Consultants: None Family Communication: None at bedside  Status is: Inpatient  Continue in-hospital care because: Difficult to place Level of care: Med-Surg   Dispo: The patient is from:  Anticipated d/c is to: Difficult to place              Patient currently is medically stable to d/c.   Difficult to place patient Yes   Scheduled Meds:  (feeding supplement) PROSource Plus  30 mL Oral BID BM   vitamin B-12  1,000 mcg Oral Daily   docusate sodium  200 mg Oral BID   enoxaparin (LOVENOX) injection  40 mg Subcutaneous Q24H   feeding supplement (KATE FARMS STANDARD 1.4)  325 mL Oral BID BM    haloperidol  1 mg Oral BID   hydrocerin   Topical BID   polyethylene glycol  17 g Oral BID   propranolol  10 mg Oral BID   scopolamine  1 patch Transdermal Q72H    PRN meds: acetaminophen, alum & mag hydroxide-simeth, fluticasone, hydrOXYzine, LORazepam, magnesium hydroxide, metoprolol tartrate, OLANZapine zydis **AND** ziprasidone, ondansetron, mouth rinse   Antimicrobials: Anti-infectives (From admission, onward)    None       Objective: Vitals:   03/12/22 0500 03/12/22 1032  BP:  122/74  Pulse:  (!) 107  Resp:  18  Temp:  97.8 F (36.6 C)  SpO2: 95%    No intake or output data in the 24 hours ending 03/12/22 1306     Filed Weights   02/03/22 2025  Weight: 72 kg   Weight change:  Body mass index is 25.62 kg/m.   Physical Exam:  Awake, sitting in the bed, in no distress, good air entry, no edema.    Data Review: I have personally reviewed the laboratory data and studies available.  Recent Labs  Lab 03/11/22 0358  WBC 3.4*  HGB 16.6*  HCT 49.7*  PLT 187  MCV 88.9  MCH 29.7  MCHC 33.4  RDW 13.2    Recent Labs  Lab 03/11/22 0606  NA 141  K 4.6  MG 2.2  CL 104  CO2 27  GLUCOSE 109*  BUN 10  CREATININE 1.09*  CALCIUM 9.3  AST 22  ALT 19  ALKPHOS 52  BILITOT 0.5  ALBUMIN 3.6      Signature  Huey Bienenstock M.D on 03/12/2022 at 1:06 PM   -  To page go to [email or web address]

## 2022-03-12 NOTE — Progress Notes (Signed)
Pt jumping out of bed every 5 min. Unsteady. Not following commands to get in bed. When she does the alarm is placed and she gets right back up after staff has left room.  Toileted 2x. Pt walking out room and when redirected to room pt is pushing this rn.  Dr. Antionette Char notified and waist belt ordered.  Cont to monitor

## 2022-03-12 NOTE — TOC Progression Note (Signed)
Transition of Care [facility]) - Progression Note    Patient Details  Name: Natalie Brown MRN: [id number] Date of Birth: 10/01/1965  Transition of Care [facility]) CM/SW Contact  Mearl Latin, LCSW Phone Number: 03/12/2022, 3:14 PM  Clinical Narrative:    CSW received call from patient's mother. CSW provided update. She stated she would contact social security office again today.      Barriers to Discharge: Financial Resources, Inadequate or no insurance, Homeless with medical needs  Expected Discharge Plan and Services   In-house Referral: Clinical Social Work   Post Acute Care Choice: Nursing Home Living arrangements for the past 2 months: Homeless [facility]) Expected Discharge Date: 12/24/21                                     Social Determinants of Health (SDOH) Interventions    Readmission Risk Interventions     No data to display

## 2022-03-13 NOTE — Progress Notes (Signed)
Mobility Specialist Progress Note:   03/13/22 0940  Mobility  Activity Ambulated with assistance in hallway  Level of Assistance Contact guard assist, steadying assist  Assistive Device Other (Comment) (HHA)  Distance Ambulated (ft) 500 ft  Activity Response Tolerated well  Mobility Referral Yes  $Mobility charge 1 Mobility   Pt agreeable to mobility session. Required only HHA for comfort. Pt back in bed with all needs met, bed alarm on.   Nelta Numbers Mobility Specialist Please contact via SecureChat or  Rehab office at [phone]

## 2022-03-13 NOTE — Progress Notes (Signed)
PROGRESS NOTE        PATIENT DETAILS Name: Natalie Brown Age: 56 y.o. Sex: female Date of Birth: November 22, 1965 Admit Date: 12/24/2021 Admitting Physician Dewayne Shorter Levora Dredge, MD [id number], [name], NP  Brief Summary: Patient is a 56 y.o.  female with history of bipolar disorder-who was hospitalized at [facility] received a Ativan challenge for possible catatonia-with minimal improvement-due to persistent altered mental status/tachycardia-patient was admitted to [facility] service for further evaluation and treatment.  No significant finding regarding her tachycardia work-up, 2D echo within normal limit, no significant electrolyte abnormalities.  Evaluated by psychiatry-and unfortunately-patient did not meet criteria to go back to [facility].  While awaiting appropriate disposition-small course complicated by acute COVID-19 infection.  Significant events: 9/4>> transferred to [facility]-from [facility]-Altered-no improvement after Ativan challenge. 9/22>> febrile 9/23>> COVID-19 positive  Significant studies: 9/04>> vitamin B12: 290 9/04>> CXR: No obvious pneumonia 9/04>> MRI brain: No acute intracranial abnormality.  Advanced cerebral/cerebellar atrophy 9/04-9/5>> LTM EEG: No seizures. 9/05>> TSH: 1.3 9/5-9/6>> LTM EEG: Negative. 9/06>> CTA chest: No PE-no PNA. 9/07>> RPR: Nonreactive 9/10>> bilateral lower extremity Doppler: No DVT 9/19>> Echo: EF 60-65%. 9/23>> CXR: No PNA 9/25>> CT head: No acute intracranial process  Significant microbiology data: 9/23>> COVID PCR positive 9/23>> blood culture: Negative  Procedures: None   Consults: Neurology, psychiatry  Subjective:   Was attempting to get out of bed earlier this morning-easily redirectable.  Objective: Vitals: Blood pressure (!) 107/93, pulse 99, temperature 99 F (37.2 C), temperature source Oral, resp. rate 19, height 5\' 6"  (1.676 m), weight 72 kg, SpO2 95 %.   Exam: Gen Exam:not in any distress HEENT:atraumatic,  normocephalic Chest: B/L clear to auscultation anteriorly CVS:S1S2 regular Abdomen:soft non tender, non distended Extremities:no edema Neurology: Non focal Skin: no rash   Assessment/Plan: Acute encephalopathy Hyperactive delirium Dementia Remains Pleasantly confused-on occasion follow simple commands. Thought to have undiagnosed dementia with significant cerebral/cerebellar atrophy on imaging studies.   No evidence of seizures-no further recommendations from neurology/psychiatry.  Elevated D-dimer  CTA chest/lower extremity Dopplers negative for VTE.  COVID-19 infection:  Now asymptomatic.  Dementia:  Significant atrophy on neuroimaging-chronic finding-neurology suspects patient has progressive dementia.   TSH/RPR stable B12 being supplemented.  Outpatient neurology follow-up  Vitamin B12 deficiency:  Continue supplementation.  Bipolar disorder:  Appreciate psych input-continue Haldol .   Per psych requires SNF/memory care.  Sinus tachycardia  Resolved-unclear etiology but thought to be due to dehydration. Continue propranolol  Tobacco abuse: Continue transdermal nicotine  BMI: Estimated body mass index is 25.62 kg/m as calculated from the following:   Height as of this encounter: 5\' 6"  (1.676 m).   Weight as of this encounter: 72 kg.   Code status:   Code Status: Prior   DVT Prophylaxis: enoxaparin (LOVENOX) injection 40 mg Start: 12/26/21 1200    Family Communication: None at bedside  Disposition Plan: Status is: Inpt Planned Discharge Destination: Memory care/SNF-awaiting bed.  Remains medically stable for transfer.  Diet: Diet Order             Diet regular Room service appropriate? No; Fluid consistency: Thin  Diet effective now                    MEDICATIONS: Scheduled Meds:  (feeding supplement) PROSource Plus  30 mL Oral BID BM   vitamin B-12  1,000 mcg Oral Daily  docusate sodium  200 mg Oral BID   enoxaparin (LOVENOX) injection   40 mg Subcutaneous Q24H   feeding supplement (KATE FARMS STANDARD 1.4)  325 mL Oral BID BM   haloperidol  1 mg Oral BID   hydrocerin   Topical BID   polyethylene glycol  17 g Oral BID   propranolol  10 mg Oral BID   scopolamine  1 patch Transdermal Q72H   Continuous Infusions: PRN Meds:.acetaminophen, alum & mag hydroxide-simeth, fluticasone, hydrOXYzine, LORazepam, magnesium hydroxide, metoprolol tartrate, OLANZapine zydis **AND** ziprasidone, ondansetron, mouth rinse   I have personally reviewed following labs and imaging studies  LABORATORY DATA:  Recent Labs  Lab 03/11/22 0358  WBC 3.4*  HGB 16.6*  HCT 49.7*  PLT 187  MCV 88.9  MCH 29.7  MCHC 33.4  RDW 13.2     Recent Labs  Lab 03/11/22 0606  NA 141  K 4.6  CL 104  CO2 27  GLUCOSE 109*  BUN 10  CREATININE 1.09*  CALCIUM 9.3  AST 22  ALT 19  ALKPHOS 52  BILITOT 0.5  ALBUMIN 3.6  MG 2.2    Lab Results  Component Value Date   TSH 1.468 01/09/2022    RADIOLOGY STUDIES/RESULTS: No results found.   LOS: 77 days   Signature  Jeoffrey Massed M.D on 03/13/2022 at 1:58 PM   -  To page go to [email or web address]

## 2022-03-14 NOTE — Progress Notes (Signed)
PROGRESS NOTE        PATIENT DETAILS Name: Natalie Brown Age: 56 y.o. Sex: female Date of Birth: 03-Nov-1965 Admit Date: 12/24/2021 Admitting Physician Evalee Mutton Kristeen Mans, MD [id number], [name], NP  Brief Summary: Patient is a 56 y.o.  female with history of bipolar disorder-who was hospitalized at [facility] received a Ativan challenge for possible catatonia-with minimal improvement-due to persistent altered mental status/tachycardia-patient was admitted to [facility] service for further evaluation and treatment.  No significant finding regarding her tachycardia work-up, 2D echo within normal limit, no significant electrolyte abnormalities.  Evaluated by psychiatry-and unfortunately-patient did not meet criteria to go back to [facility].  While awaiting appropriate disposition-small course complicated by acute [id number] infection.  Significant events: 9/4>> transferred to [facility]-from [facility]-Altered-no improvement after Ativan challenge. 9/22>> febrile 9/23>> COVID-19 positive  Significant studies: 9/04>> vitamin B12: 290 9/04>> CXR: No obvious pneumonia 9/04>> MRI brain: No acute intracranial abnormality.  Advanced cerebral/cerebellar atrophy 9/04-9/5>> LTM EEG: No seizures. 9/05>> TSH: 1.3 9/5-9/6>> LTM EEG: Negative. 9/06>> CTA chest: No PE-no PNA. 9/07>> RPR: Nonreactive 9/10>> bilateral lower extremity Doppler: No DVT 9/19>> Echo: EF 60-65%. 9/23>> CXR: No PNA 9/25>> CT head: No acute intracranial process  Significant microbiology data: 9/23>> COVID PCR positive 9/23>> blood culture: Negative  Procedures: None   Consults: Neurology, psychiatry  Subjective:   No issues overnight-confused-appears to be playing a crossword puzzle.  Objective: Vitals: Blood pressure 114/80, pulse 95, temperature 98.9 F (37.2 C), temperature source Oral, resp. rate 18, height 5\' 6"  (1.676 m), weight 72 kg, SpO2 98 %.   Exam: Gen Exam:not in any distress HEENT:atraumatic,  normocephalic Chest: B/L clear to auscultation anteriorly CVS:S1S2 regular Abdomen:soft non tender, non distended Extremities:no edema Neurology: Non focal Skin: no rash   Assessment/Plan: Acute encephalopathy Hyperactive delirium Dementia Remains Pleasantly confused-on occasion follow simple commands. Thought to have undiagnosed dementia with significant cerebral/cerebellar atrophy on imaging studies.  No evidence of seizures-no further recommendations from neurology/psychiatry.  Elevated D-dimer  CTA chest/lower extremity Dopplers negative for VTE.  COVID-19 infection:  Now asymptomatic.  Dementia:  Significant atrophy on neuroimaging-chronic finding-neurology suspects patient has progressive dementia.   TSH/RPR stable B12 being supplemented.  Outpatient neurology follow-up  Vitamin B12 deficiency:  Continue supplementation.  Bipolar disorder:  Appreciate psych input-continue Haldol .   Per psych requires SNF/memory care.  Sinus tachycardia  Resolved-unclear etiology but thought to be due to dehydration. Continue propranolol  Tobacco abuse: Continue transdermal nicotine  BMI: Estimated body mass index is 25.62 kg/m as calculated from the following:   Height as of this encounter: 5\' 6"  (1.676 m).   Weight as of this encounter: 72 kg.   Code status:   Code Status: Prior   DVT Prophylaxis: enoxaparin (LOVENOX) injection 40 mg Start: 12/26/21 1200    Family Communication: None at bedside  Disposition Plan: Status is: Inpt Planned Discharge Destination: Memory care/SNF-awaiting bed.  Remains medically stable for transfer.  Diet: Diet Order             Diet regular Room service appropriate? No; Fluid consistency: Thin  Diet effective now                    MEDICATIONS: Scheduled Meds:  (feeding supplement) PROSource Plus  30 mL Oral BID BM   vitamin B-12  1,000 mcg Oral Daily   docusate sodium  200 mg Oral BID   enoxaparin (LOVENOX) injection   40 mg Subcutaneous Q24H   feeding supplement (KATE FARMS STANDARD 1.4)  325 mL Oral BID BM   haloperidol  1 mg Oral BID   hydrocerin   Topical BID   polyethylene glycol  17 g Oral BID   propranolol  10 mg Oral BID   scopolamine  1 patch Transdermal Q72H   Continuous Infusions: PRN Meds:.acetaminophen, alum & mag hydroxide-simeth, fluticasone, hydrOXYzine, LORazepam, magnesium hydroxide, metoprolol tartrate, OLANZapine zydis **AND** ziprasidone, ondansetron, mouth rinse   I have personally reviewed following labs and imaging studies  LABORATORY DATA:  Recent Labs  Lab 03/11/22 0358  WBC 3.4*  HGB 16.6*  HCT 49.7*  PLT 187  MCV 88.9  MCH 29.7  MCHC 33.4  RDW 13.2     Recent Labs  Lab 03/11/22 0606  NA 141  K 4.6  CL 104  CO2 27  GLUCOSE 109*  BUN 10  CREATININE 1.09*  CALCIUM 9.3  AST 22  ALT 19  ALKPHOS 52  BILITOT 0.5  ALBUMIN 3.6  MG 2.2    Lab Results  Component Value Date   TSH 1.468 01/09/2022    RADIOLOGY STUDIES/RESULTS: No results found.   LOS: 78 days   Signature  Jeoffrey Massed M.D on 03/14/2022 at 11:42 AM   -  To page go to [email or web address]

## 2022-03-14 NOTE — Progress Notes (Signed)
Mobility Specialist Progress Note   03/14/22 1230  Mobility  Activity Ambulated with assistance in hallway;Ambulated with assistance in room  Level of Assistance Contact guard assist, steadying assist  Assistive Device None  Distance Ambulated (ft) 40 ft  Range of Motion/Exercises Active;All extremities  Activity Response Tolerated well;RN notified   Patient received in hallway holding meal tray and bed alarm active. Assisted patient back to room and discovered that her bed and gown was soaked with urine.  Was left sitting upright in bed with all needs met, call bell in reach. RN notified.  [place] Braydyn Schultes, BS EXP Mobility Specialist Please contact via SecureChat or Rehab office at [phone]

## 2022-03-15 NOTE — Progress Notes (Signed)
Mobility Specialist Progress Note:   03/15/22 0935  Mobility  Activity Dangled on edge of bed ((limited))  Level of Assistance Standby assist, set-up cues, supervision of patient - no hands on  Assistive Device None  Activity Response Tolerated fair  Mobility Referral Yes  $Mobility charge 1 Mobility   Pt very lethargic this am, difficult to arouse. Continuously falling back to sleep once on EOB. Further mobility deferred until later this date. Pt back in bed with all needs met, bed alarm on.   Nelta Numbers Mobility Specialist Please contact via SecureChat or  Rehab office at [phone]

## 2022-03-15 NOTE — Progress Notes (Signed)
PROGRESS NOTE        PATIENT DETAILS Name: Natalie Brown Age: 56 y.o. Sex: female Date of Birth: 05/13/1965 Admit Date: 12/24/2021 Admitting Physician Evalee Mutton Kristeen Mans, MD [id number], [name], NP  Brief Summary: Patient is a 56 y.o.  female with history of bipolar disorder-who was hospitalized at [facility] received a Ativan challenge for possible catatonia-with minimal improvement-due to persistent altered mental status/tachycardia-patient was admitted to [facility] service for further evaluation and treatment.  No significant finding regarding her tachycardia work-up, 2D echo within normal limit, no significant electrolyte abnormalities.  Evaluated by psychiatry-and unfortunately-patient did not meet criteria to go back to [facility].  While awaiting appropriate disposition-small course complicated by acute [id number] infection.  Significant events: 9/4>> transferred to [facility]-from [facility]-Altered-no improvement after Ativan challenge. 9/22>> febrile 9/23>> COVID-19 positive  Significant studies: 9/04>> vitamin B12: 290 9/04>> CXR: No obvious pneumonia 9/04>> MRI brain: No acute intracranial abnormality.  Advanced cerebral/cerebellar atrophy 9/04-9/5>> LTM EEG: No seizures. 9/05>> TSH: 1.3 9/5-9/6>> LTM EEG: Negative. 9/06>> CTA chest: No PE-no PNA. 9/07>> RPR: Nonreactive 9/10>> bilateral lower extremity Doppler: No DVT 9/19>> Echo: EF 60-65%. 9/23>> CXR: No PNA 9/25>> CT head: No acute intracranial process  Significant microbiology data: 9/23>> COVID PCR positive 9/23>> blood culture: Negative  Procedures: None   Consults: Neurology, psychiatry  Subjective:   Seen earlier this morning-sleeping comfortably but awoke-did not speak.  Moves all 4 extremities.  Looks like she received prn Zyprexa/lorazepam earlier this morning.  Objective: Vitals: Blood pressure 112/65, pulse 62, temperature 97.7 F (36.5 C), temperature source Oral, resp. rate 18, height 5\' 6"   (1.676 m), weight 72 kg, SpO2 98 %.   Exam: Gen Exam:not in any distress HEENT:atraumatic, normocephalic Chest: B/L clear to auscultation anteriorly CVS:S1S2 regular Abdomen:soft non tender, non distended Extremities:no edema Neurology: Non focal Skin: no rash   Assessment/Plan: Acute encephalopathy Hyperactive delirium Dementia Remains Pleasantly confused-on occasion follow simple commands. Thought to have undiagnosed dementia with significant cerebral/cerebellar atrophy on imaging studies.  No evidence of seizures-no further recommendations from neurology/psychiatry.  Elevated D-dimer  CTA chest/lower extremity Dopplers negative for VTE.  COVID-19 infection:  Now asymptomatic.  Dementia:  Significant atrophy on neuroimaging-chronic finding-neurology suspects patient has progressive dementia.   TSH/RPR stable B12 being supplemented.  Outpatient neurology follow-up  Vitamin B12 deficiency:  Continue supplementation.  Bipolar disorder:  Appreciate psych input-continue Haldol .   Per psych requires SNF/memory care.  Sinus tachycardia  Resolved-unclear etiology but thought to be due to dehydration. Continue propranolol  Tobacco abuse: Continue transdermal nicotine  BMI: Estimated body mass index is 25.62 kg/m as calculated from the following:   Height as of this encounter: 5\' 6"  (1.676 m).   Weight as of this encounter: 72 kg.   Code status:   Code Status: Prior   DVT Prophylaxis: enoxaparin (LOVENOX) injection 40 mg Start: 12/26/21 1200    Family Communication: None at bedside  Disposition Plan: Status is: Inpt Planned Discharge Destination: Memory care/SNF-awaiting bed.  Remains medically stable for transfer.  Diet: Diet Order             Diet regular Room service appropriate? No; Fluid consistency: Thin  Diet effective now                    MEDICATIONS: Scheduled Meds:  (feeding supplement) PROSource Plus  30 mL Oral  BID BM   vitamin  B-12  1,000 mcg Oral Daily   docusate sodium  200 mg Oral BID   enoxaparin (LOVENOX) injection  40 mg Subcutaneous Q24H   feeding supplement (KATE FARMS STANDARD 1.4)  325 mL Oral BID BM   haloperidol  1 mg Oral BID   hydrocerin   Topical BID   polyethylene glycol  17 g Oral BID   propranolol  10 mg Oral BID   scopolamine  1 patch Transdermal Q72H   Continuous Infusions: PRN Meds:.acetaminophen, alum & mag hydroxide-simeth, fluticasone, hydrOXYzine, LORazepam, magnesium hydroxide, metoprolol tartrate, OLANZapine zydis **AND** ziprasidone, ondansetron, mouth rinse   I have personally reviewed following labs and imaging studies  LABORATORY DATA:  Recent Labs  Lab 03/11/22 0358  WBC 3.4*  HGB 16.6*  HCT 49.7*  PLT 187  MCV 88.9  MCH 29.7  MCHC 33.4  RDW 13.2     Recent Labs  Lab 03/11/22 0606  NA 141  K 4.6  CL 104  CO2 27  GLUCOSE 109*  BUN 10  CREATININE 1.09*  CALCIUM 9.3  AST 22  ALT 19  ALKPHOS 52  BILITOT 0.5  ALBUMIN 3.6  MG 2.2    Lab Results  Component Value Date   TSH 1.468 01/09/2022    RADIOLOGY STUDIES/RESULTS: No results found.   LOS: 79 days   Signature  Jeoffrey Massed M.D on 03/15/2022 at 11:30 AM   -  To page go to [email or web address]

## 2022-03-15 NOTE — TOC Progression Note (Signed)
Transition of Care [facility]) - Progression Note    Patient Details  Name: Natalie Brown MRN: [id number] Date of Birth: 01-01-1966  Transition of Care [facility]) CM/SW Contact  Mearl Latin, LCSW Phone Number: 03/15/2022, 8:43 AM  Clinical Narrative:    CSW continuing to follow. No Letter of Guarantee bed offers at this time.     Barriers to Discharge: Financial Resources, Inadequate or no insurance, Homeless with medical needs  Expected Discharge Plan and Services   In-house Referral: Clinical Social Work   Post Acute Care Choice: Nursing Home Living arrangements for the past 2 months: Homeless [facility]) Expected Discharge Date: 12/24/21                                     Social Determinants of Health (SDOH) Interventions    Readmission Risk Interventions     No data to display

## 2022-03-16 NOTE — Plan of Care (Signed)

## 2022-03-16 NOTE — Progress Notes (Signed)
Mobility Specialist Progress Note:   03/16/22 1430  Mobility  Activity Ambulated with assistance in hallway  Level of Assistance Contact guard assist, steadying assist  Assistive Device Other (Comment) (HHA)  Distance Ambulated (ft) 500 ft  Activity Response Tolerated well  Mobility Referral Yes  $Mobility charge 1 Mobility   Pt received ambulating in room with bed alarm going off. Pt asymptomatic throughout ambulation. Pt left in bed with all needs met, call bell in reach, bed alarm on, and RN in room.   Andrey Campanile Mobility Specialist Please contact via SecureChat or  Rehab office at [phone]

## 2022-03-16 NOTE — Progress Notes (Signed)
PROGRESS NOTE        PATIENT DETAILS Name: Natalie Brown Age: 56 y.o. Sex: female Date of Birth: 1965-06-02 Admit Date: 12/24/2021 Admitting Physician Dewayne Shorter Levora Dredge, MD [id number], [name], NP  Brief Summary: Patient is a 56 y.o.  female with history of bipolar disorder-who was hospitalized at [facility] received a Ativan challenge for possible catatonia-with minimal improvement-due to persistent altered mental status/tachycardia-patient was admitted to [facility] service for further evaluation and treatment.  No significant finding regarding her tachycardia work-up, 2D echo within normal limit, no significant electrolyte abnormalities.  Evaluated by psychiatry-and unfortunately-patient did not meet criteria to go back to [facility].  While awaiting appropriate disposition-small course complicated by acute COVID-19 infection.  Significant events: 9/4>> transferred to [facility]-from [facility]-Altered-no improvement after Ativan challenge. 9/22>> febrile 9/23>> COVID-19 positive  Significant studies: 9/04>> vitamin B12: 290 9/04>> CXR: No obvious pneumonia 9/04>> MRI brain: No acute intracranial abnormality.  Advanced cerebral/cerebellar atrophy 9/04-9/5>> LTM EEG: No seizures. 9/05>> TSH: 1.3 9/5-9/6>> LTM EEG: Negative. 9/06>> CTA chest: No PE-no PNA. 9/07>> RPR: Nonreactive 9/10>> bilateral lower extremity Doppler: No DVT 9/19>> Echo: EF 60-65%. 9/23>> CXR: No PNA 9/25>> CT head: No acute intracranial process  Significant microbiology data: 9/23>> COVID PCR positive 9/23>> blood culture: Negative  Procedures: None   Consults: Neurology, psychiatry  Subjective:   Seen earlier this morning-eating breakfast.  I did not want to talk to me.  However followed by movement.  Moving all 4 extremities.  No major issues overnight.  Objective: Vitals: Blood pressure 105/86, pulse 95, temperature 99.1 F (37.3 C), temperature source Oral, resp. rate 17, height 5\' 6"  (1.676 m),  weight 72 kg, SpO2 97 %.   Exam: Gen Exam:not in any distress HEENT:atraumatic, normocephalic Chest: B/L clear to auscultation anteriorly CVS:S1S2 regular Abdomen:soft non tender, non distended Extremities:no edema Neurology: Non focal Skin: no rash   Assessment/Plan: Acute encephalopathy Hyperactive delirium Dementia Remains Pleasantly confused-on occasion follow simple commands. Thought to have undiagnosed dementia with significant cerebral/cerebellar atrophy on imaging studies.  No evidence of seizures-no further recommendations from neurology/psychiatry.  Elevated D-dimer  CTA chest/lower extremity Dopplers negative for VTE.  COVID-19 infection:  Now asymptomatic.  Dementia:  Significant atrophy on neuroimaging-chronic finding-neurology suspects patient has progressive dementia.   TSH/RPR stable B12 being supplemented.  Outpatient neurology follow-up  Vitamin B12 deficiency:  Continue supplementation.  Bipolar disorder:  Appreciate psych input-continue Haldol .   Per psych requires SNF/memory care.  Sinus tachycardia  Resolved-unclear etiology but thought to be due to dehydration. Continue propranolol  Tobacco abuse: Continue transdermal nicotine  BMI: Estimated body mass index is 25.62 kg/m as calculated from the following:   Height as of this encounter: 5\' 6"  (1.676 m).   Weight as of this encounter: 72 kg.   Code status:   Code Status: Prior   DVT Prophylaxis: enoxaparin (LOVENOX) injection 40 mg Start: 12/26/21 1200    Family Communication: None at bedside  Disposition Plan: Status is: Inpt Planned Discharge Destination: Memory care/SNF-awaiting bed.  Remains medically stable for transfer.  Diet: Diet Order             Diet regular Room service appropriate? No; Fluid consistency: Thin  Diet effective now                    MEDICATIONS: Scheduled Meds:  (feeding supplement) PROSource  Plus  30 mL Oral BID BM   vitamin B-12  1,000  mcg Oral Daily   docusate sodium  200 mg Oral BID   enoxaparin (LOVENOX) injection  40 mg Subcutaneous Q24H   feeding supplement (KATE FARMS STANDARD 1.4)  325 mL Oral BID BM   haloperidol  1 mg Oral BID   hydrocerin   Topical BID   polyethylene glycol  17 g Oral BID   propranolol  10 mg Oral BID   scopolamine  1 patch Transdermal Q72H   Continuous Infusions: PRN Meds:.acetaminophen, alum & mag hydroxide-simeth, fluticasone, hydrOXYzine, LORazepam, magnesium hydroxide, metoprolol tartrate, OLANZapine zydis **AND** ziprasidone, ondansetron, mouth rinse   I have personally reviewed following labs and imaging studies  LABORATORY DATA:  Recent Labs  Lab 03/11/22 0358  WBC 3.4*  HGB 16.6*  HCT 49.7*  PLT 187  MCV 88.9  MCH 29.7  MCHC 33.4  RDW 13.2     Recent Labs  Lab 03/11/22 0606  NA 141  K 4.6  CL 104  CO2 27  GLUCOSE 109*  BUN 10  CREATININE 1.09*  CALCIUM 9.3  AST 22  ALT 19  ALKPHOS 52  BILITOT 0.5  ALBUMIN 3.6  MG 2.2    Lab Results  Component Value Date   TSH 1.468 01/09/2022    RADIOLOGY STUDIES/RESULTS: No results found.   LOS: 80 days   Signature  Jeoffrey Massed M.D on 03/16/2022 at 10:40 AM   -  To page go to [email or web address]

## 2022-03-16 NOTE — Progress Notes (Signed)
Mobility Specialist Progress Note:   03/16/22 1124  Mobility  Activity Ambulated with assistance in hallway  Level of Assistance Contact guard assist, steadying assist  Assistive Device Other (Comment) (HHA)  Distance Ambulated (ft) 700 ft  Activity Response Tolerated well  Mobility Referral Yes  $Mobility charge 1 Mobility   Pt received in bed and agreeable. Pt asymptomatic throughout. Pt left in bed with all needs met, call bell in reach, and bed alarm on.   Andrey Campanile Mobility Specialist Please contact via SecureChat or  Rehab office at [phone]

## 2022-03-16 NOTE — Plan of Care (Signed)
?  Problem: Coping: ?Goal: Level of anxiety will decrease ?Outcome: Progressing ?  ?Problem: Safety: ?Goal: Ability to remain free from injury will improve ?Outcome: Progressing ?  ?

## 2022-03-17 NOTE — Progress Notes (Signed)
Mobility Specialist Progress Note:   03/17/22 1106  Mobility  Activity Ambulated with assistance in hallway  Level of Assistance Contact guard assist, steadying assist  Assistive Device Other (Comment) (HHA)  Distance Ambulated (ft) 500 ft  Activity Response Tolerated well  Mobility Referral Yes  $Mobility charge 1 Mobility   Pt received in bed and agreeable. Asymptomatic throughout. Pt left in bed with all needs met, call bell in reach, and bed alarm on.   Andrey Campanile Mobility Specialist Please contact via SecureChat or  Rehab office at [phone]

## 2022-03-17 NOTE — Progress Notes (Signed)
Mobility Specialist Progress Note:   03/17/22 1413  Mobility  Activity Ambulated with assistance in hallway  Level of Assistance Contact guard assist, steadying assist  Assistive Device Other (Comment) (HHA)  Distance Ambulated (ft) 500 ft  Activity Response Tolerated well  Mobility Referral Yes  $Mobility charge 1 Mobility   Pt received ambulating in room with bed alarm going off. Pt asymptomatic throughout ambulation. Pt left in bed with all needs met, call bell in reach, and bed alarm on.   Andrey Campanile Mobility Specialist Please contact via SecureChat or  Rehab office at [phone]

## 2022-03-17 NOTE — Progress Notes (Signed)
PROGRESS NOTE        PATIENT DETAILS Name: Natalie Brown Age: 56 y.o. Sex: female Date of Birth: 1966/04/10 Admit Date: 12/24/2021 Admitting Physician Dewayne Shorter Levora Dredge, MD LOV:[id number], [name], NP  Brief Summary: Patient is a 56 y.o.  female with history of bipolar disorder-who was hospitalized at [facility] received a Ativan challenge for possible catatonia-with minimal improvement-due to persistent altered mental status/tachycardia-patient was admitted to [facility] service for further evaluation and treatment.  No significant finding regarding her tachycardia work-up, 2D echo within normal limit, no significant electrolyte abnormalities.  Evaluated by psychiatry-and unfortunately-patient did not meet criteria to go back to [facility].  While awaiting appropriate disposition-small course complicated by acute COVID-19 infection.  Significant events: 9/4>> transferred to [facility]-from [facility]-Altered-no improvement after Ativan challenge. 9/22>> febrile 9/23>> COVID-19 positive  Significant studies: 9/04>> vitamin B12: 290 9/04>> CXR: No obvious pneumonia 9/04>> MRI brain: No acute intracranial abnormality.  Advanced cerebral/cerebellar atrophy 9/04-9/5>> LTM EEG: No seizures. 9/05>> TSH: 1.3 9/5-9/6>> LTM EEG: Negative. 9/06>> CTA chest: No PE-no PNA. 9/07>> RPR: Nonreactive 9/10>> bilateral lower extremity Doppler: No DVT 9/19>> Echo: EF 60-65%. 9/23>> CXR: No PNA 9/25>> CT head: No acute intracranial process  Significant microbiology data: 9/23>> COVID PCR positive 9/23>> blood culture: Negative  Procedures: None   Consults: Neurology, psychiatry  Subjective:   Seen earlier this morning-was attempting to get out of bed-I helped her back in bed.  Objective: Vitals: Blood pressure 114/83, pulse 97, temperature 97.8 F (36.6 C), temperature source Oral, resp. rate 16, height 5\' 6"  (1.676 m), weight 72 kg, SpO2 98 %.   Exam: Gen Exam:not in any distress Moving  all 4 extremities Abdomen: Soft nontender   Assessment/Plan: Acute encephalopathy Hyperactive delirium Dementia Remains Pleasantly confused-on occasion follow simple commands. Thought to have undiagnosed dementia with significant cerebral/cerebellar atrophy on imaging studies.  No evidence of seizures-no further recommendations from neurology/psychiatry.  Elevated D-dimer  CTA chest/lower extremity Dopplers negative for VTE.  COVID-19 infection:  Now asymptomatic.  Dementia:  Significant atrophy on neuroimaging-chronic finding-neurology suspects patient has progressive dementia.   TSH/RPR stable B12 being supplemented.  Outpatient neurology follow-up  Vitamin B12 deficiency:  Continue supplementation.  Bipolar disorder:  Appreciate psych input-continue Haldol .   Per psych requires SNF/memory care.  Sinus tachycardia  Resolved-unclear etiology but thought to be due to dehydration. Continue propranolol  Tobacco abuse: Continue transdermal nicotine  BMI: Estimated body mass index is 25.62 kg/m as calculated from the following:   Height as of this encounter: 5\' 6"  (1.676 m).   Weight as of this encounter: 72 kg.   Code status:   Code Status: Prior   DVT Prophylaxis: enoxaparin (LOVENOX) injection 40 mg Start: 12/26/21 1200    Family Communication: None at bedside  Disposition Plan: Status is: Inpt Planned Discharge Destination: Memory care/SNF-awaiting bed.  Remains medically stable for transfer.  Diet: Diet Order             Diet regular Room service appropriate? No; Fluid consistency: Thin  Diet effective now                    MEDICATIONS: Scheduled Meds:  (feeding supplement) PROSource Plus  30 mL Oral BID BM   vitamin B-12  1,000 mcg Oral Daily   docusate sodium  200 mg Oral BID   enoxaparin (LOVENOX) injection  40 mg Subcutaneous Q24H   feeding supplement (KATE FARMS STANDARD 1.4)  325 mL Oral BID BM   haloperidol  1 mg Oral BID    hydrocerin   Topical BID   polyethylene glycol  17 g Oral BID   propranolol  10 mg Oral BID   scopolamine  1 patch Transdermal Q72H   Continuous Infusions: PRN Meds:.acetaminophen, alum & mag hydroxide-simeth, fluticasone, hydrOXYzine, LORazepam, magnesium hydroxide, metoprolol tartrate, OLANZapine zydis **AND** ziprasidone, ondansetron, mouth rinse   I have personally reviewed following labs and imaging studies  LABORATORY DATA:  Recent Labs  Lab 03/11/22 0358  WBC 3.4*  HGB 16.6*  HCT 49.7*  PLT 187  MCV 88.9  MCH 29.7  MCHC 33.4  RDW 13.2     Recent Labs  Lab 03/11/22 0606  NA 141  K 4.6  CL 104  CO2 27  GLUCOSE 109*  BUN 10  CREATININE 1.09*  CALCIUM 9.3  AST 22  ALT 19  ALKPHOS 52  BILITOT 0.5  ALBUMIN 3.6  MG 2.2    Lab Results  Component Value Date   TSH 1.468 01/09/2022    RADIOLOGY STUDIES/RESULTS: No results found.   LOS: 81 days   Signature  Jeoffrey Massed M.D on 03/17/2022 at 10:26 AM   -  To page go to [email or web address]

## 2022-03-18 LAB — COMPREHENSIVE METABOLIC PANEL
ALT: 19 U/L (ref 0–44)
AST: 22 U/L (ref 15–41)
Albumin: 3.3 g/dL — ABNORMAL LOW (ref 3.5–5.0)
Alkaline Phosphatase: 46 U/L (ref 38–126)
Anion gap: 13 (ref 5–15)
BUN: 13 mg/dL (ref 6–20)
CO2: 22 mmol/L (ref 22–32)
Calcium: 8.9 mg/dL (ref 8.9–10.3)
Chloride: 103 mmol/L (ref 98–111)
Creatinine, Ser: 1.33 mg/dL — ABNORMAL HIGH (ref 0.44–1.00)
GFR, Estimated: 48 mL/min — ABNORMAL LOW (ref >60)
Glucose, Bld: 162 mg/dL — ABNORMAL HIGH (ref 70–99)
Potassium: 3.7 mmol/L (ref 3.5–5.1)
Sodium: 138 mmol/L (ref 135–145)
Total Bilirubin: 0.5 mg/dL (ref 0.3–1.2)
Total Protein: 6.3 g/dL — ABNORMAL LOW (ref 6.5–8.1)

## 2022-03-18 LAB — CBC
HCT: 39.7 % (ref 36.0–46.0)
Hemoglobin: 13.3 g/dL (ref 12.0–15.0)
MCH: 29.4 pg (ref 26.0–34.0)
MCHC: 33.5 g/dL (ref 30.0–36.0)
MCV: 87.6 fL (ref 80.0–100.0)
Platelets: 263 10*3/uL (ref 150–400)
RBC: 4.53 MIL/uL (ref 3.87–5.11)
RDW: 12.9 % (ref 11.5–15.5)
WBC: 4.9 10*3/uL (ref 4.0–10.5)
nRBC: 0 % (ref 0.0–0.2)

## 2022-03-18 LAB — MAGNESIUM: Magnesium: 1.9 mg/dL (ref 1.7–2.4)

## 2022-03-18 NOTE — Progress Notes (Signed)
Mobility Specialist Progress Note:   03/18/22 1120  Mobility  Activity Ambulated with assistance in hallway  Level of Assistance Contact guard assist, steadying assist  Assistive Device Other (Comment) (HHA)  Distance Ambulated (ft) 500 ft  Activity Response Tolerated well  Mobility Referral Yes  $Mobility charge 1 Mobility   Pt received exiting bed with bed alarm going off. Pt asymptomatic throughout ambulation. Pt left in bed with all needs met, call bell in reach, and bed alarm on.    Andrey Campanile Mobility Specialist Please contact via SecureChat or  Rehab office at [phone]

## 2022-03-18 NOTE — Progress Notes (Signed)
PROGRESS NOTE        PATIENT DETAILS Name: Natalie Brown Age: 56 y.o. Sex: female Date of Birth: 14-Jun-1965 Admit Date: 12/24/2021 Admitting Physician Dewayne Shorter Levora Dredge, MD WHQ:[id number], [name], NP  Brief Summary: Patient is a 56 y.o.  female with history of bipolar disorder-who was hospitalized at [facility] received a Ativan challenge for possible catatonia-with minimal improvement-due to persistent altered mental status/tachycardia-patient was admitted to [facility] service for further evaluation and treatment.  No significant finding regarding her tachycardia work-up, 2D echo within normal limit, no significant electrolyte abnormalities.  Evaluated by psychiatry-and unfortunately-patient did not meet criteria to go back to [facility].  While awaiting appropriate disposition-small course complicated by acute COVID-19 infection.  Significant events: 9/4>> transferred to [facility]-from [facility]-Altered-no improvement after Ativan challenge. 9/22>> febrile 9/23>> COVID-19 positive  Significant studies: 9/04>> vitamin B12: 290 9/04>> CXR: No obvious pneumonia 9/04>> MRI brain: No acute intracranial abnormality.  Advanced cerebral/cerebellar atrophy 9/04-9/5>> LTM EEG: No seizures. 9/05>> TSH: 1.3 9/5-9/6>> LTM EEG: Negative. 9/06>> CTA chest: No PE-no PNA. 9/07>> RPR: Nonreactive 9/10>> bilateral lower extremity Doppler: No DVT 9/19>> Echo: EF 60-65%. 9/23>> CXR: No PNA 9/25>> CT head: No acute intracranial process  Significant microbiology data: 9/23>> COVID PCR positive 9/23>> blood culture: Negative  Procedures: None   Consults: Neurology, psychiatry  Subjective:   Whispered good morning to me this morning.  Appears to have a tablet with her-that she was trying to use.  Objective: Vitals: Blood pressure 101/87, pulse 77, temperature 98 F (36.7 C), temperature source Oral, resp. rate 19, height 5\' 6"  (1.676 m), weight 72 kg, SpO2 98 %.   Exam: Awake-trying to use  a tablet Soft/nontender abdomen Moving all 4 extremities Minimally verbal  Assessment/Plan: Acute encephalopathy Hyperactive delirium Dementia Remains Pleasantly confused-on occasion follow simple commands. Thought to have undiagnosed dementia with significant cerebral/cerebellar atrophy on imaging studies.  No evidence of seizures-no further recommendations from neurology/psychiatry.  Elevated D-dimer  CTA chest/lower extremity Dopplers negative for VTE.  COVID-19 infection:  Now asymptomatic.  Dementia:  Significant atrophy on neuroimaging-chronic finding-neurology suspects patient has progressive dementia.   TSH/RPR stable B12 being supplemented.  Outpatient neurology follow-up  Vitamin B12 deficiency:  Continue supplementation.  Bipolar disorder:  Appreciate psych input-continue Haldol .   Per psych requires SNF/memory care.  Sinus tachycardia  Resolved-unclear etiology but thought to be due to dehydration. Continue propranolol  Tobacco abuse: Continue transdermal nicotine  BMI: Estimated body mass index is 25.62 kg/m as calculated from the following:   Height as of this encounter: 5\' 6"  (1.676 m).   Weight as of this encounter: 72 kg.   Code status:   Code Status: Prior   DVT Prophylaxis: enoxaparin (LOVENOX) injection 40 mg Start: 12/26/21 1200    Family Communication: None at bedside  Disposition Plan: Status is: Inpt Planned Discharge Destination: Memory care/SNF-awaiting bed.  Remains medically stable for transfer.  Diet: Diet Order             Diet regular Room service appropriate? No; Fluid consistency: Thin  Diet effective now                    MEDICATIONS: Scheduled Meds:  (feeding supplement) PROSource Plus  30 mL Oral BID BM   vitamin B-12  1,000 mcg Oral Daily   docusate sodium  200 mg Oral BID  enoxaparin (LOVENOX) injection  40 mg Subcutaneous Q24H   feeding supplement (KATE FARMS STANDARD 1.4)  325 mL Oral BID BM    haloperidol  1 mg Oral BID   hydrocerin   Topical BID   polyethylene glycol  17 g Oral BID   propranolol  10 mg Oral BID   scopolamine  1 patch Transdermal Q72H   Continuous Infusions: PRN Meds:.acetaminophen, alum & mag hydroxide-simeth, fluticasone, hydrOXYzine, LORazepam, magnesium hydroxide, metoprolol tartrate, OLANZapine zydis **AND** ziprasidone, ondansetron, mouth rinse   I have personally reviewed following labs and imaging studies  LABORATORY DATA:  Recent Labs  Lab 03/18/22 0000  WBC 4.9  HGB 13.3  HCT 39.7  PLT 263  MCV 87.6  MCH 29.4  MCHC 33.5  RDW 12.9     Recent Labs  Lab 03/18/22 0000  NA 138  K 3.7  CL 103  CO2 22  GLUCOSE 162*  BUN 13  CREATININE 1.33*  CALCIUM 8.9  AST 22  ALT 19  ALKPHOS 46  BILITOT 0.5  ALBUMIN 3.3*  MG 1.9    Lab Results  Component Value Date   TSH 1.468 01/09/2022    RADIOLOGY STUDIES/RESULTS: No results found.   LOS: 82 days   Signature  Oren Binet M.D on 03/18/2022 at 10:12 AM   -  To page go to [email or web address]

## 2022-03-18 NOTE — TOC Progression Note (Signed)
Transition of Care [facility]) - Progression Note    Patient Details  Name: Natalie Brown MRN: [id number] Date of Birth: 1965-05-15  Transition of Care [facility]) CM/SW Contact  Mearl Latin, LCSW Phone Number: 03/18/2022, 8:46 AM  Clinical Narrative:    CSW continuing to follow. Placement barrier includes no bed offers and inability to be approved for special assistance until Disability is approved.      Barriers to Discharge: Financial Resources, Inadequate or no insurance, Homeless with medical needs  Expected Discharge Plan and Services   In-house Referral: Clinical Social Work   Post Acute Care Choice: Nursing Home Living arrangements for the past 2 months: Homeless [facility]) Expected Discharge Date: 12/24/21                                     Social Determinants of Health (SDOH) Interventions    Readmission Risk Interventions     No data to display

## 2022-03-19 NOTE — Plan of Care (Signed)

## 2022-03-19 NOTE — Progress Notes (Signed)
PROGRESS NOTE        PATIENT DETAILS Name: Natalie Brown Age: 56 y.o. Sex: female Date of Birth: 11/06/1965 Admit Date: 12/24/2021 Admitting Physician Dewayne Shorter Levora Dredge, MD CWC:[id number], [name], NP  Brief Summary: Patient is a 56 y.o.  female with history of bipolar disorder-who was hospitalized at [facility] received a Ativan challenge for possible catatonia-with minimal improvement-due to persistent altered mental status/tachycardia-patient was admitted to [facility] service for further evaluation and treatment.  No significant finding regarding her tachycardia work-up, 2D echo within normal limit, no significant electrolyte abnormalities.  Evaluated by psychiatry-and unfortunately-patient did not meet criteria to go back to [facility].  While awaiting appropriate disposition-small course complicated by acute COVID-19 infection.  Significant events: 9/4>> transferred to [facility]-from [facility]-Altered-no improvement after Ativan challenge. 9/22>> febrile 9/23>> COVID-19 positive  Significant studies: 9/04>> vitamin B12: 290 9/04>> CXR: No obvious pneumonia 9/04>> MRI brain: No acute intracranial abnormality.  Advanced cerebral/cerebellar atrophy 9/04-9/5>> LTM EEG: No seizures. 9/05>> TSH: 1.3 9/5-9/6>> LTM EEG: Negative. 9/06>> CTA chest: No PE-no PNA. 9/07>> RPR: Nonreactive 9/10>> bilateral lower extremity Doppler: No DVT 9/19>> Echo: EF 60-65%. 9/23>> CXR: No PNA 9/25>> CT head: No acute intracranial process  Significant microbiology data: 9/23>> COVID PCR positive 9/23>> blood culture: Negative  Procedures: None   Consults: Neurology, psychiatry  Subjective:   Eating breakfast-said "hi" to me this morning.  After that she did not even say a word.  Objective: Vitals: Blood pressure 101/87, pulse 77, temperature 98 F (36.7 C), temperature source Oral, resp. rate 19, height 5\' 6"  (1.676 m), weight 72 kg, SpO2 98 %.   Exam: Awake/alert Soft abdomen-without any  tenderness. No leg edema Moving all 4 extremities symmetrically.  Assessment/Plan: Acute encephalopathy Hyperactive delirium Dementia Remains Pleasantly confused-on occasion follow simple commands. Thought to have undiagnosed dementia with significant cerebral/cerebellar atrophy on imaging studies.  No evidence of seizures-no further recommendations from neurology/psychiatry.  Elevated D-dimer  CTA chest/lower extremity Dopplers negative for VTE.  COVID-19 infection:  Now asymptomatic.  Dementia:  Significant atrophy on neuroimaging-chronic finding-neurology suspects patient has progressive dementia.   TSH/RPR stable B12 being supplemented.  Outpatient neurology follow-up  Vitamin B12 deficiency:  Continue supplementation.  Bipolar disorder:  Appreciate psych input-continue Haldol .   Per psych requires SNF/memory care.  Sinus tachycardia  Resolved-unclear etiology but thought to be due to dehydration. Continue propranolol  Tobacco abuse: Continue transdermal nicotine  BMI: Estimated body mass index is 25.62 kg/m as calculated from the following:   Height as of this encounter: 5\' 6"  (1.676 m).   Weight as of this encounter: 72 kg.   Code status:   Code Status: Prior   DVT Prophylaxis: enoxaparin (LOVENOX) injection 40 mg Start: 12/26/21 1200    Family Communication: None at bedside  Disposition Plan: Status is: Inpt Planned Discharge Destination: Memory care/SNF-awaiting bed.  Remains medically stable for transfer.  Diet: Diet Order             Diet regular Room service appropriate? No; Fluid consistency: Thin  Diet effective now                    MEDICATIONS: Scheduled Meds:  (feeding supplement) PROSource Plus  30 mL Oral BID BM   vitamin B-12  1,000 mcg Oral Daily   docusate sodium  200 mg Oral BID   enoxaparin (  LOVENOX) injection  40 mg Subcutaneous Q24H   feeding supplement (KATE FARMS STANDARD 1.4)  325 mL Oral BID BM   haloperidol  1  mg Oral BID   hydrocerin   Topical BID   polyethylene glycol  17 g Oral BID   propranolol  10 mg Oral BID   scopolamine  1 patch Transdermal Q72H   Continuous Infusions: PRN Meds:.acetaminophen, alum & mag hydroxide-simeth, fluticasone, hydrOXYzine, LORazepam, magnesium hydroxide, metoprolol tartrate, OLANZapine zydis **AND** ziprasidone, ondansetron, mouth rinse   I have personally reviewed following labs and imaging studies  LABORATORY DATA:  Recent Labs  Lab 03/18/22 0000  WBC 4.9  HGB 13.3  HCT 39.7  PLT 263  MCV 87.6  MCH 29.4  MCHC 33.5  RDW 12.9     Recent Labs  Lab 03/18/22 0000  NA 138  K 3.7  CL 103  CO2 22  GLUCOSE 162*  BUN 13  CREATININE 1.33*  CALCIUM 8.9  AST 22  ALT 19  ALKPHOS 46  BILITOT 0.5  ALBUMIN 3.3*  MG 1.9    Lab Results  Component Value Date   TSH 1.468 01/09/2022    RADIOLOGY STUDIES/RESULTS: No results found.   LOS: 42 days   Signature  Oren Binet M.D on 03/19/2022 at 12:07 PM   -  To page go to [email or web address]

## 2022-03-19 NOTE — Progress Notes (Signed)
Mobility Specialist Progress Note:   03/19/22 1030  Mobility  Activity Ambulated with assistance in hallway;Ambulated independently in room;Ambulated independently in hallway;Ambulated with assistance in room  Level of Assistance Standby assist, set-up cues, supervision of patient - no hands on  Assistive Device None  Distance Ambulated (ft) 200 ft  Activity Response Tolerated well  Mobility Referral Yes  $Mobility charge 1 Mobility   Pt repeatedly getting OOB this am, no physical assistance required. Pt redirected back to room. Linens changed successfully with pushback from pt. Pt left with all needs met, bed alarm on.   Nelta Numbers Mobility Specialist Please contact via SecureChat or  Rehab office at [phone]

## 2022-03-19 NOTE — Progress Notes (Signed)
Mobility Specialist Progress Note:   03/19/22 0930  Mobility  Activity Ambulated with assistance in room  Level of Assistance Standby assist, set-up cues, supervision of patient - no hands on  Assistive Device None  Distance Ambulated (ft) 50 ft  Activity Response Tolerated fair  Mobility Referral Yes  $Mobility charge 1 Mobility   Responded to bed alarm, pt found soiled in liquid BM. Assisted with pericare. Pt refused for sheets to be changed, and crawled back in bed to finish breakfast. Will return for ambulation.   Addison Lank Mobility Specialist Please contact via SecureChat or  Rehab office at [phone]

## 2022-03-19 NOTE — Progress Notes (Signed)
Mobility Specialist Progress Note:   03/19/22 0950  Mobility  Activity Ambulated with assistance in hallway  Level of Assistance Contact guard assist, steadying assist  Assistive Device Other (Comment) (HHA)  Distance Ambulated (ft) 500 ft  Activity Response Tolerated well  Mobility Referral Yes  $Mobility charge 1 Mobility   Pt now eager for hallway ambulation. Required HHA for comfort only. Pt back in bed with all needs met. Bed alarm on.   Nelta Numbers Mobility Specialist Please contact via SecureChat or  Rehab office at [phone]

## 2022-03-20 NOTE — Progress Notes (Signed)
Mobility Specialist Progress Note:   03/20/22 0925  Mobility  Activity Ambulated independently in hallway;Ambulated independently in room;Ambulated with assistance in hallway;Ambulated with assistance in room  Level of Assistance Standby assist, set-up cues, supervision of patient - no hands on  Assistive Device None  Distance Ambulated (ft) 100 ft  Activity Response Tolerated well  Mobility Referral Yes  $Mobility charge 1 Mobility   Responded to bed alarm, pt received ambulating independently in room and hallway. Redirected to room, pt left sitting EOB eating breakfast. Nsg staff aware.  Addison Lank Mobility Specialist Please contact via SecureChat or  Rehab office at [phone]

## 2022-03-20 NOTE — Plan of Care (Signed)

## 2022-03-20 NOTE — TOC Progression Note (Signed)
Transition of Care [facility]) - Progression Note    Patient Details  Name: Natalie Brown MRN: [id number] Date of Birth: Sep 04, 1965  Transition of Care [facility]) CM/SW Contact  Mearl Latin, LCSW Phone Number: 03/20/2022, 8:58 AM  Clinical Narrative:    CSW provided [facility] Director with [facility] contact info. He spoke with Inetta Fermo and she is asking leadership if they can accept patient on a Letter of Guarantee from the hospital pending Disability approval.      Barriers to Discharge: Financial Resources, Inadequate or no insurance, Homeless with medical needs  Expected Discharge Plan and Services   In-house Referral: Clinical Social Work   Post Acute Care Choice: Nursing Home Living arrangements for the past 2 months: Homeless [facility]) Expected Discharge Date: 12/24/21                                     Social Determinants of Health (SDOH) Interventions    Readmission Risk Interventions     No data to display

## 2022-03-20 NOTE — Progress Notes (Signed)
PROGRESS NOTE        PATIENT DETAILS Name: Natalie Brown Age: 56 y.o. Sex: female Date of Birth: 10-03-1965 Admit Date: 12/24/2021 Admitting Physician Evalee Mutton Kristeen Mans, MD [id number], [name], NP  Brief Summary: Patient is a 56 y.o.  female with history of bipolar disorder-who was hospitalized at [facility] received a Ativan challenge for possible catatonia-with minimal improvement-due to persistent altered mental status/tachycardia-patient was admitted to [facility] service for further evaluation and treatment.  No significant finding regarding her tachycardia work-up, 2D echo within normal limit, no significant electrolyte abnormalities.  Evaluated by psychiatry-and unfortunately-patient did not meet criteria to go back to [facility].  While awaiting appropriate disposition-small course complicated by acute [id number] infection.  Significant events: 9/4>> transferred to [facility]-from [facility]-Altered-no improvement after Ativan challenge. 9/22>> febrile 9/23>> COVID-19 positive  Significant studies: 9/04>> vitamin B12: 290 9/04>> CXR: No obvious pneumonia 9/04>> MRI brain: No acute intracranial abnormality.  Advanced cerebral/cerebellar atrophy 9/04-9/5>> LTM EEG: No seizures. 9/05>> TSH: 1.3 9/5-9/6>> LTM EEG: Negative. 9/06>> CTA chest: No PE-no PNA. 9/07>> RPR: Nonreactive 9/10>> bilateral lower extremity Doppler: No DVT 9/19>> Echo: EF 60-65%. 9/23>> CXR: No PNA 9/25>> CT head: No acute intracranial process  Significant microbiology data: 9/23>> COVID PCR positive 9/23>> blood culture: Negative  Procedures: None   Consults: Neurology, psychiatry  Subjective:   Ambulating in the hallway with nursing staff this morning.  Later on when I saw her in the room-she was on a tablet.  Acknowledged me and again said "Hi"  Objective: Vitals: Blood pressure 104/83, pulse 82, temperature 98.1 F (36.7 C), temperature source Oral, resp. rate 16, height 5\' 6"  (1.676 m), weight  72 kg, SpO2 97 %.   Exam: Awake/alert Abdomen soft nontender Nonfocal exam.  Assessment/Plan: Acute encephalopathy Hyperactive delirium Dementia Confused but redirectable Unchanged over the past several days/weeks Thought to have undiagnosed dementia with significant cerebral/cerebellar atrophy on imaging studies.  No evidence of seizures-no further recommendations from neurology/psychiatry.  Elevated D-dimer  CTA chest/lower extremity Dopplers negative for VTE.  COVID-19 infection:  Now asymptomatic.  Dementia:  Significant atrophy on neuroimaging-chronic finding-neurology suspects patient has progressive dementia.   TSH/RPR stable B12 being supplemented.  Outpatient neurology follow-up  Vitamin B12 deficiency:  Continue supplementation.  Bipolar disorder:  Appreciate psych input-continue Haldol .   Per psych requires SNF/memory care.  Sinus tachycardia  Resolved-unclear etiology but thought to be due to dehydration. Continue propranolol  Tobacco abuse: Continue transdermal nicotine  BMI: Estimated body mass index is 25.62 kg/m as calculated from the following:   Height as of this encounter: 5\' 6"  (1.676 m).   Weight as of this encounter: 72 kg.   Code status:   Code Status: Prior   DVT Prophylaxis: enoxaparin (LOVENOX) injection 40 mg Start: 12/26/21 1200    Family Communication: None at bedside  Disposition Plan: Status is: Inpt Planned Discharge Destination: Memory care/SNF-awaiting bed.  Remains medically stable for transfer.  Diet: Diet Order             Diet regular Room service appropriate? No; Fluid consistency: Thin  Diet effective now                    MEDICATIONS: Scheduled Meds:  (feeding supplement) PROSource Plus  30 mL Oral BID BM   vitamin B-12  1,000 mcg Oral Daily   docusate sodium  200 mg Oral BID   enoxaparin (LOVENOX) injection  40 mg Subcutaneous Q24H   feeding supplement (KATE FARMS STANDARD 1.4)  325 mL Oral BID  BM   haloperidol  1 mg Oral BID   hydrocerin   Topical BID   polyethylene glycol  17 g Oral BID   propranolol  10 mg Oral BID   scopolamine  1 patch Transdermal Q72H   Continuous Infusions: PRN Meds:.acetaminophen, alum & mag hydroxide-simeth, fluticasone, hydrOXYzine, LORazepam, magnesium hydroxide, metoprolol tartrate, OLANZapine zydis **AND** ziprasidone, ondansetron, mouth rinse   I have personally reviewed following labs and imaging studies  LABORATORY DATA:  Recent Labs  Lab 03/18/22 0000  WBC 4.9  HGB 13.3  HCT 39.7  PLT 263  MCV 87.6  MCH 29.4  MCHC 33.5  RDW 12.9     Recent Labs  Lab 03/18/22 0000  NA 138  K 3.7  CL 103  CO2 22  GLUCOSE 162*  BUN 13  CREATININE 1.33*  CALCIUM 8.9  AST 22  ALT 19  ALKPHOS 46  BILITOT 0.5  ALBUMIN 3.3*  MG 1.9    Lab Results  Component Value Date   TSH 1.468 01/09/2022    RADIOLOGY STUDIES/RESULTS: No results found.   LOS: 84 days   Signature  Jeoffrey Massed M.D on 03/20/2022 at 11:01 AM   -  To page go to [email or web address]

## 2022-03-20 NOTE — Progress Notes (Signed)
Mobility Specialist Progress Note:   03/20/22 1000  Mobility  Activity Ambulated with assistance in hallway  Level of Assistance Contact guard assist, steadying assist  Assistive Device Other (Comment) (HHA)  Distance Ambulated (ft) 500 ft  Activity Response Tolerated well  Mobility Referral Yes  $Mobility charge 1 Mobility   Pt eager to mobilize this am. Required HHA for comfort. Pt back in bed with all needs met, finishing breakfast. Bed alarm on.   Nelta Numbers Mobility Specialist Please contact via SecureChat or  Rehab office at [phone]

## 2022-03-21 ENCOUNTER — Encounter (HOSPITAL_COMMUNITY): Payer: Self-pay | Admitting: Internal Medicine

## 2022-03-21 NOTE — Progress Notes (Signed)
Mobility Specialist Progress Note:   03/21/22 1135  Mobility  Activity Ambulated with assistance in hallway;Ambulated independently in hallway  Level of Assistance Contact guard assist, steadying assist  Assistive Device Other (Comment) (HHA)  Distance Ambulated (ft) 200 ft  Activity Response Tolerated well  Mobility Referral Yes  $Mobility charge 1 Mobility   Responded to bed alarm. Pt found ambulating independently to nurses station. Pt redirected back to room. Back in bed with all needs met.   Nelta Numbers Mobility Specialist Please contact via SecureChat or  Rehab office at [phone]

## 2022-03-21 NOTE — Progress Notes (Signed)
PROGRESS NOTE        PATIENT DETAILS Name: Natalie Brown Age: 56 y.o. Sex: female Date of Birth: 1965/08/18 Admit Date: 12/24/2021 Admitting Physician Dewayne Shorter Levora Dredge, MD [id number], [name], NP  Brief Summary: Patient is a 56 y.o.  female with history of bipolar disorder-who was hospitalized at [facility] received a Ativan challenge for possible catatonia-with minimal improvement-due to persistent altered mental status/tachycardia-patient was admitted to [facility] service for further evaluation and treatment.  No significant finding regarding her tachycardia work-up, 2D echo within normal limit, no significant electrolyte abnormalities.  Evaluated by psychiatry-and unfortunately-patient did not meet criteria to go back to [facility].  While awaiting appropriate disposition-small course complicated by acute COVID-19 infection.  Significant events: 9/4>> transferred to [facility]-from [facility]-Altered-no improvement after Ativan challenge. 9/22>> febrile 9/23>> COVID-19 positive  Significant studies: 9/04>> vitamin B12: 290 9/04>> CXR: No obvious pneumonia 9/04>> MRI brain: No acute intracranial abnormality.  Advanced cerebral/cerebellar atrophy 9/04-9/5>> LTM EEG: No seizures. 9/05>> TSH: 1.3 9/5-9/6>> LTM EEG: Negative. 9/06>> CTA chest: No PE-no PNA. 9/07>> RPR: Nonreactive 9/10>> bilateral lower extremity Doppler: No DVT 9/19>> Echo: EF 60-65%. 9/23>> CXR: No PNA 9/25>> CT head: No acute intracranial process  Significant microbiology data: 9/23>> COVID PCR positive 9/23>> blood culture: Negative  Procedures: None   Consults: Neurology, psychiatry  Subjective:   Eating breakfast this morning-said one word again "hi"  Objective: Vitals: Blood pressure 122/67, pulse 72, temperature 98 F (36.7 C), temperature source Axillary, resp. rate 18, height 5\' 6"  (1.676 m), weight 72 kg, SpO2 95 %.   Exam: Awake-not in any distress Moving all 4 extremities Abdomen-soft  nontender  Assessment/Plan: Acute encephalopathy Hyperactive delirium Dementia Confused but redirectable Unchanged over the past several days/weeks Thought to have undiagnosed dementia with significant cerebral/cerebellar atrophy on imaging studies.  No evidence of seizures-no further recommendations from neurology/psychiatry.  Elevated D-dimer  CTA chest/lower extremity Dopplers negative for VTE.  COVID-19 infection:  Now asymptomatic.  Dementia:  Significant atrophy on neuroimaging-chronic finding-neurology suspects patient has progressive dementia.   TSH/RPR stable B12 being supplemented.  Outpatient neurology follow-up  Vitamin B12 deficiency:  Continue supplementation.  Bipolar disorder:  Appreciate psych input-continue Haldol .   Per psych requires SNF/memory care.  Sinus tachycardia  Resolved-unclear etiology but thought to be due to dehydration. Continue propranolol  Tobacco abuse: Continue transdermal nicotine  BMI: Estimated body mass index is 25.62 kg/m as calculated from the following:   Height as of this encounter: 5\' 6"  (1.676 m).   Weight as of this encounter: 72 kg.   Code status:   Code Status: Prior   DVT Prophylaxis: enoxaparin (LOVENOX) injection 40 mg Start: 12/26/21 1200    Family Communication: None at bedside  Disposition Plan: Status is: Inpt Planned Discharge Destination: Memory care/SNF-awaiting bed.  Remains medically stable for transfer.  Diet: Diet Order             Diet regular Room service appropriate? No; Fluid consistency: Thin  Diet effective now                    MEDICATIONS: Scheduled Meds:  (feeding supplement) PROSource Plus  30 mL Oral BID BM   vitamin B-12  1,000 mcg Oral Daily   docusate sodium  200 mg Oral BID   enoxaparin (LOVENOX) injection  40 mg Subcutaneous Q24H   feeding  supplement (KATE FARMS STANDARD 1.4)  325 mL Oral BID BM   haloperidol  1 mg Oral BID   hydrocerin   Topical BID    polyethylene glycol  17 g Oral BID   propranolol  10 mg Oral BID   scopolamine  1 patch Transdermal Q72H   Continuous Infusions: PRN Meds:.acetaminophen, alum & mag hydroxide-simeth, fluticasone, hydrOXYzine, LORazepam, magnesium hydroxide, metoprolol tartrate, OLANZapine zydis **AND** ziprasidone, ondansetron, mouth rinse   I have personally reviewed following labs and imaging studies  LABORATORY DATA:  Recent Labs  Lab 03/18/22 0000  WBC 4.9  HGB 13.3  HCT 39.7  PLT 263  MCV 87.6  MCH 29.4  MCHC 33.5  RDW 12.9     Recent Labs  Lab 03/18/22 0000  NA 138  K 3.7  CL 103  CO2 22  GLUCOSE 162*  BUN 13  CREATININE 1.33*  CALCIUM 8.9  AST 22  ALT 19  ALKPHOS 46  BILITOT 0.5  ALBUMIN 3.3*  MG 1.9    Lab Results  Component Value Date   TSH 1.468 01/09/2022    RADIOLOGY STUDIES/RESULTS: No results found.   LOS: 85 days   Signature  Oren Binet M.D on 03/21/2022 at 12:08 PM   -  To page go to [email or web address]

## 2022-03-21 NOTE — TOC Progression Note (Signed)
Transition of Care [facility]) - Progression Note    Patient Details  Name: JOSEFA SYRACUSE MRN: [id number] Date of Birth: [birth year]  Transition of Care [facility]) CM/SW Contact  Mearl Latin, LCSW Phone Number: 03/21/2022, 4:57 PM  Clinical Narrative:    CSW left voicemail for Inetta Fermo at [facility] to see if her leadership had approved an LOG.      Barriers to Discharge: Financial Resources, Inadequate or no insurance, Homeless with medical needs  Expected Discharge Plan and Services   In-house Referral: Clinical Social Work   Post Acute Care Choice: Nursing Home Living arrangements for the past 2 months: Homeless [facility]) Expected Discharge Date: 12/24/21                                     Social Determinants of Health (SDOH) Interventions    Readmission Risk Interventions     No data to display

## 2022-03-21 NOTE — Progress Notes (Signed)
Mobility Specialist Progress Note:   03/21/22 0946  Mobility  Activity Ambulated with assistance in hallway  Level of Assistance Contact guard assist, steadying assist  Assistive Device Other (Comment) (HHA)  Distance Ambulated (ft) 500 ft  Activity Response Tolerated well  Mobility Referral Yes  $Mobility charge 1 Mobility   Pt eager for mobility session. Required only HHA for comfort. Pt back in bed with all needs met, bed alarm on.  Nelta Numbers Mobility Specialist Please contact via SecureChat or  Rehab office at [phone]

## 2022-03-22 NOTE — Progress Notes (Signed)
PROGRESS NOTE        PATIENT DETAILS Name: Natalie Brown Age: 56 y.o. Sex: female Date of Birth: [birth year] Admit Date: 12/24/2021 Admitting Physician Dewayne Shorter Levora Dredge, MD [id number], [name], NP  Brief Summary: Patient is a 56 y.o.  female with history of bipolar disorder-who was hospitalized at [facility] received a Ativan challenge for possible catatonia-with minimal improvement-due to persistent altered mental status/tachycardia-patient was admitted to [facility] service for further evaluation and treatment.  No significant finding regarding her tachycardia work-up, 2D echo within normal limit, no significant electrolyte abnormalities.  Evaluated by psychiatry-and unfortunately-patient did not meet criteria to go back to [facility].  While awaiting appropriate disposition-small course complicated by acute COVID-19 infection.  Significant events: 9/4>> transferred to [facility]-from [facility]-Altered-no improvement after Ativan challenge. 9/22>> febrile 9/23>> COVID-19 positive  Significant studies: 9/04>> vitamin B12: 290 9/04>> CXR: No obvious pneumonia 9/04>> MRI brain: No acute intracranial abnormality.  Advanced cerebral/cerebellar atrophy 9/04-9/5>> LTM EEG: No seizures. 9/05>> TSH: 1.3 9/5-9/6>> LTM EEG: Negative. 9/06>> CTA chest: No PE-no PNA. 9/07>> RPR: Nonreactive 9/10>> bilateral lower extremity Doppler: No DVT 9/19>> Echo: EF 60-65%. 9/23>> CXR: No PNA 9/25>> CT head: No acute intracranial process  Significant microbiology data: 9/23>> COVID PCR positive 9/23>> blood culture: Negative  Procedures: None   Consults: Neurology, psychiatry  Subjective:   Eating breakfast this morning-she was attempting to charge her tablet when I walked in this morning.  Objective: Vitals: Blood pressure 109/88, pulse 90, temperature 98.2 F (36.8 C), temperature source Oral, resp. rate 18, height 5\' 6"  (1.676 m), weight 72 kg, SpO2 95 %.   Exam: Awake-not in any  distress Moving all 4 extremities Abdomen-soft nontender  Assessment/Plan: Acute encephalopathy Hyperactive delirium Dementia Confused Unchanged over the past several days/weeks Thought to have undiagnosed dementia with significant cerebral/cerebellar atrophy on imaging studies.  No evidence of seizures-no further recommendations from neurology/psychiatry.  Elevated D-dimer  CTA chest/lower extremity Dopplers negative for VTE.  COVID-19 infection:  Now asymptomatic.  Dementia:  Significant atrophy on neuroimaging-chronic finding-neurology suspects patient has progressive dementia.   TSH/RPR stable B12 being supplemented.  Outpatient neurology follow-up  Vitamin B12 deficiency:  Continue supplementation.  Bipolar disorder:  Appreciate psych input-continue Haldol .   Per psych requires SNF/memory care.  Sinus tachycardia  Resolved-unclear etiology but thought to be due to dehydration. Continue propranolol  Tobacco abuse: Continue transdermal nicotine  BMI: Estimated body mass index is 25.62 kg/m as calculated from the following:   Height as of this encounter: 5\' 6"  (1.676 m).   Weight as of this encounter: 72 kg.   Code status:   Code Status: Prior   DVT Prophylaxis: enoxaparin (LOVENOX) injection 40 mg Start: 12/26/21 1200    Family Communication: None at bedside  Disposition Plan: Status is: Inpt Planned Discharge Destination: Memory care/SNF-awaiting bed.  Remains medically stable for transfer.  Diet: Diet Order             Diet regular Room service appropriate? No; Fluid consistency: Thin  Diet effective now                    MEDICATIONS: Scheduled Meds:  (feeding supplement) PROSource Plus  30 mL Oral BID BM   vitamin B-12  1,000 mcg Oral Daily   docusate sodium  200 mg Oral BID   enoxaparin (LOVENOX) injection  40  mg Subcutaneous Q24H   feeding supplement (KATE FARMS STANDARD 1.4)  325 mL Oral BID BM   haloperidol  1 mg Oral BID    hydrocerin   Topical BID   polyethylene glycol  17 g Oral BID   propranolol  10 mg Oral BID   scopolamine  1 patch Transdermal Q72H   Continuous Infusions: PRN Meds:.acetaminophen, alum & mag hydroxide-simeth, fluticasone, hydrOXYzine, LORazepam, magnesium hydroxide, metoprolol tartrate, OLANZapine zydis **AND** ziprasidone, ondansetron, mouth rinse   I have personally reviewed following labs and imaging studies  LABORATORY DATA:  Recent Labs  Lab 03/18/22 0000  WBC 4.9  HGB 13.3  HCT 39.7  PLT 263  MCV 87.6  MCH 29.4  MCHC 33.5  RDW 12.9     Recent Labs  Lab 03/18/22 0000  NA 138  K 3.7  CL 103  CO2 22  GLUCOSE 162*  BUN 13  CREATININE 1.33*  CALCIUM 8.9  AST 22  ALT 19  ALKPHOS 46  BILITOT 0.5  ALBUMIN 3.3*  MG 1.9    Lab Results  Component Value Date   TSH 1.468 01/09/2022    RADIOLOGY STUDIES/RESULTS: No results found.   LOS: 74 days   Signature  Oren Binet M.D on 03/22/2022 at 2:06 PM   -  To page go to [email or web address]

## 2022-03-22 NOTE — Progress Notes (Signed)
Mobility Specialist Progress Note:   03/22/22 1015  Mobility  Activity Ambulated independently to bathroom;Ambulated with assistance to bathroom  Level of Assistance Standby assist, set-up cues, supervision of patient - no hands on  Assistive Device None  Distance Ambulated (ft) 30 ft  Activity Response Tolerated well  Mobility Referral Yes  $Mobility charge 1 Mobility   Pt requesting to go to BR. Required no physical assistance. Pt back in bed with all needs met.     Mobility Specialist Please contact via SecureChat or  Rehab office at [phone]  

## 2022-03-22 NOTE — Progress Notes (Signed)
Mobility Specialist Progress Note:   03/22/22 1000  Mobility  Activity Ambulated with assistance in hallway  Level of Assistance Contact guard assist, steadying assist  Assistive Device Other (Comment) (HHA)  Distance Ambulated (ft) 500 ft  Activity Response Tolerated well  Mobility Referral Yes  $Mobility charge 1 Mobility   Pt eager for mobility session. Required only HHA for comfort. Pt became incontinent of stool in hallway. Pericare performed, pt back in bed with all needs met.  Nelta Numbers Mobility Specialist Please contact via SecureChat or  Rehab office at [phone]

## 2022-03-22 NOTE — TOC Progression Note (Signed)
Transition of Care [facility]) - Progression Note    Patient Details  Name: Natalie Brown MRN: [id number] Date of Birth: 22-May-1965  Transition of Care [facility]) CM/SW Contact  Mearl Latin, LCSW Phone Number: 03/22/2022, 3:41 PM  Clinical Narrative:    Awaiting call back from [facility].      Barriers to Discharge: Financial Resources, Inadequate or no insurance, Homeless with medical needs  Expected Discharge Plan and Services   In-house Referral: Clinical Social Work   Post Acute Care Choice: Nursing Home Living arrangements for the past 2 months: Homeless [facility]) Expected Discharge Date: 12/24/21                                     Social Determinants of Health (SDOH) Interventions    Readmission Risk Interventions     No data to display

## 2022-03-22 NOTE — Progress Notes (Signed)
[facility] night cross cover note:  I was notified by RN that this patient is agitated, attempting to get out bed, with these behaviors refractory to attempts at verbal redirection and in spite of receipt of scheduled oral Haldol.  In the setting of associated interference with ongoing medical treatment posing potential harm to themself, I have placed orders for soft waist restraint.     Newton Pigg, DO Hospitalist

## 2022-03-23 NOTE — Progress Notes (Signed)
Mobility Specialist Progress Note:   03/23/22 1000  Mobility  Activity Ambulated with assistance in hallway  Level of Assistance Contact guard assist, steadying assist  Assistive Device Other (Comment) (HHA)  Distance Ambulated (ft) 500 ft  Activity Response Tolerated well  Mobility Referral Yes  $Mobility charge 1 Mobility   Pt eager for mobility session. Required HHA for comfort. Pt back in bed with all needs met.     Mobility Specialist Please contact via SecureChat or  Rehab office at [phone]  

## 2022-03-23 NOTE — Plan of Care (Signed)
  Problem: Clinical Measurements: Goal: Respiratory complications will improve Outcome: Progressing Goal: Cardiovascular complication will be avoided Outcome: Progressing   Problem: Coping: Goal: Level of anxiety will decrease Outcome: Progressing   Problem: Elimination: Goal: Will not experience complications related to bowel motility Outcome: Progressing   Problem: Pain Managment: Goal: General experience of comfort will improve Outcome: Progressing   

## 2022-03-23 NOTE — Progress Notes (Signed)
PROGRESS NOTE        PATIENT DETAILS Name: Natalie Brown Age: 56 y.o. Sex: female Date of Birth: Oct 26, 1965 Admit Date: 12/24/2021 Admitting Physician Dewayne Shorter Levora Dredge, MD [id number], [name], NP  Brief Summary: Patient is a 56 y.o.  female with history of bipolar disorder-who was hospitalized at [facility] received a Ativan challenge for possible catatonia-with minimal improvement-due to persistent altered mental status/tachycardia-patient was admitted to [facility] service for further evaluation and treatment.  No significant finding regarding her tachycardia work-up, 2D echo within normal limit, no significant electrolyte abnormalities.  Evaluated by psychiatry-and unfortunately-patient did not meet criteria to go back to [facility].  While awaiting appropriate disposition-small course complicated by acute COVID-19 infection.  Significant events: 9/4>> transferred to [facility]-from [facility]-Altered-no improvement after Ativan challenge. 9/22>> febrile 9/23>> COVID-19 positive  Significant studies: 9/04>> vitamin B12: 290 9/04>> CXR: No obvious pneumonia 9/04>> MRI brain: No acute intracranial abnormality.  Advanced cerebral/cerebellar atrophy 9/04-9/5>> LTM EEG: No seizures. 9/05>> TSH: 1.3 9/5-9/6>> LTM EEG: Negative. 9/06>> CTA chest: No PE-no PNA. 9/07>> RPR: Nonreactive 9/10>> bilateral lower extremity Doppler: No DVT 9/19>> Echo: EF 60-65%. 9/23>> CXR: No PNA 9/25>> CT head: No acute intracranial process  Significant microbiology data: 9/23>> COVID PCR positive 9/23>> blood culture: Negative  Procedures: None   Consults: Neurology, psychiatry  Subjective:    Patient in bed eating breakfast, in no distress denies any headache or chest pain.  No shortness of breath.  Objective: Vitals: Blood pressure 112/74, pulse 60, temperature 98 F (36.7 C), temperature source Oral, resp. rate 16, height 5\' 6"  (1.676 m), weight 72 kg, SpO2 100 %.   Exam:  Awake but  pleasantly confused, CTAB, soft abdomen.  Assessment/Plan: Acute encephalopathy Hyperactive delirium Dementia Confused Unchanged over the past several days/weeks Thought to have undiagnosed dementia with significant cerebral/cerebellar atrophy on imaging studies.  No evidence of seizures-no further recommendations from neurology/psychiatry.  Elevated D-dimer  CTA chest/lower extremity Dopplers negative for VTE.  COVID-19 infection:  Now asymptomatic.  Dementia:  Significant atrophy on neuroimaging-chronic finding-neurology suspects patient has progressive dementia.   TSH/RPR stable B12 being supplemented.  Outpatient neurology follow-up  Vitamin B12 deficiency:  Continue supplementation.  Bipolar disorder:  Appreciate psych input-continue Haldol .   Per psych requires SNF/memory care.  Sinus tachycardia  Resolved-unclear etiology but thought to be due to dehydration. Continue propranolol  Tobacco abuse: Continue transdermal nicotine  BMI: Estimated body mass index is 25.62 kg/m as calculated from the following:   Height as of this encounter: 5\' 6"  (1.676 m).   Weight as of this encounter: 72 kg.   Code status:   Code Status: Prior   DVT Prophylaxis: enoxaparin (LOVENOX) injection 40 mg Start: 12/26/21 1200    Family Communication: None at bedside  Disposition Plan: Status is: Inpt Planned Discharge Destination: Memory care/SNF-awaiting bed.  Remains medically stable for transfer.  Diet: Diet Order             Diet regular Room service appropriate? No; Fluid consistency: Thin  Diet effective now                    MEDICATIONS: Scheduled Meds:  (feeding supplement) PROSource Plus  30 mL Oral BID BM   vitamin B-12  1,000 mcg Oral Daily   docusate sodium  200 mg Oral BID   enoxaparin (LOVENOX) injection  40 mg Subcutaneous Q24H   feeding supplement (KATE FARMS STANDARD 1.4)  325 mL Oral BID BM   haloperidol  1 mg Oral BID   hydrocerin   Topical  BID   polyethylene glycol  17 g Oral BID   propranolol  10 mg Oral BID   scopolamine  1 patch Transdermal Q72H   Continuous Infusions: PRN Meds:.acetaminophen, alum & mag hydroxide-simeth, fluticasone, hydrOXYzine, LORazepam, magnesium hydroxide, metoprolol tartrate, OLANZapine zydis **AND** ziprasidone, ondansetron, mouth rinse   I have personally reviewed following labs and imaging studies  LABORATORY DATA:  Recent Labs  Lab 03/18/22 0000  WBC 4.9  HGB 13.3  HCT 39.7  PLT 263  MCV 87.6  MCH 29.4  MCHC 33.5  RDW 12.9    Recent Labs  Lab 03/18/22 0000  NA 138  K 3.7  CL 103  CO2 22  GLUCOSE 162*  BUN 13  CREATININE 1.33*  CALCIUM 8.9  AST 22  ALT 19  ALKPHOS 46  BILITOT 0.5  ALBUMIN 3.3*  MG 1.9   Lab Results  Component Value Date   TSH 1.468 01/09/2022    RADIOLOGY STUDIES/RESULTS: No results found.   LOS: 87 days   Signature  Lala Lund M.D on 03/23/2022 at 9:05 AM   -  To page go to [email or web address]

## 2022-03-24 NOTE — Progress Notes (Signed)
PROGRESS NOTE        PATIENT DETAILS Name: Natalie Brown Age: 56 y.o. Sex: female Date of Birth: Feb 19, 1966 Admit Date: 12/24/2021 Admitting Physician Evalee Mutton Kristeen Mans, MD [id number], [name], NP  Brief Summary: Patient is a 56 y.o.  female with history of bipolar disorder-who was hospitalized at [facility] received a Ativan challenge for possible catatonia-with minimal improvement-due to persistent altered mental status/tachycardia-patient was admitted to [facility] service for further evaluation and treatment.  No significant finding regarding her tachycardia work-up, 2D echo within normal limit, no significant electrolyte abnormalities.  Evaluated by psychiatry-and unfortunately-patient did not meet criteria to go back to [facility].  While awaiting appropriate disposition-small course complicated by acute [id number] infection.  Significant events: 9/4>> transferred to [facility]-from [facility]-Altered-no improvement after Ativan challenge. 9/22>> febrile 9/23>> COVID-19 positive  Significant studies: 9/04>> vitamin B12: 290 9/04>> CXR: No obvious pneumonia 9/04>> MRI brain: No acute intracranial abnormality.  Advanced cerebral/cerebellar atrophy 9/04-9/5>> LTM EEG: No seizures. 9/05>> TSH: 1.3 9/5-9/6>> LTM EEG: Negative. 9/06>> CTA chest: No PE-no PNA. 9/07>> RPR: Nonreactive 9/10>> bilateral lower extremity Doppler: No DVT 9/19>> Echo: EF 60-65%. 9/23>> CXR: No PNA 9/25>> CT head: No acute intracranial process  Significant microbiology data: 9/23>> COVID PCR positive 9/23>> blood culture: Negative  Procedures: None   Consults: Neurology, psychiatry  Subjective:  Patient in bed, appears comfortable, denies any headache, no fever, no chest pain or pressure, no shortness of breath , no abdominal pain. No new focal weakness.   Objective: Vitals: Blood pressure 139/82, pulse 95, temperature 98.2 F (36.8 C), temperature source Oral, resp. rate 18, height 5\' 6"  (1.676  m), weight 72 kg, SpO2 100 %.   Exam:  Awake but pleasantly confused, CTAB, soft abdomen.  Assessment/Plan: Acute encephalopathy Hyperactive delirium Dementia Confused Unchanged over the past several days/weeks Thought to have undiagnosed dementia with significant cerebral/cerebellar atrophy on imaging studies.  No evidence of seizures-no further recommendations from neurology/psychiatry.  Elevated D-dimer  CTA chest/lower extremity Dopplers negative for VTE.  COVID-19 infection:  Now asymptomatic.  Dementia:  Significant atrophy on neuroimaging-chronic finding-neurology suspects patient has progressive dementia.   TSH/RPR stable B12 being supplemented.  Outpatient neurology follow-up  Vitamin B12 deficiency:  Continue supplementation.  Bipolar disorder:  Appreciate psych input-continue Haldol .   Per psych requires SNF/memory care.  Sinus tachycardia  Resolved-unclear etiology but thought to be due to dehydration. Continue propranolol  Tobacco abuse: Continue transdermal nicotine  BMI: Estimated body mass index is 25.62 kg/m as calculated from the following:   Height as of this encounter: 5\' 6"  (1.676 m).   Weight as of this encounter: 72 kg.   Code status:   Code Status: Full Code   DVT Prophylaxis: enoxaparin (LOVENOX) injection 40 mg Start: 12/26/21 1200    Family Communication: None at bedside  Disposition Plan: Status is: Inpt Planned Discharge Destination: Memory care/SNF-awaiting bed.  Remains medically stable for transfer.  Diet: Diet Order             Diet regular Room service appropriate? No; Fluid consistency: Thin  Diet effective now                    MEDICATIONS: Scheduled Meds:  (feeding supplement) PROSource Plus  30 mL Oral BID BM   vitamin B-12  1,000 mcg Oral Daily   docusate sodium  200  mg Oral BID   enoxaparin (LOVENOX) injection  40 mg Subcutaneous Q24H   feeding supplement (KATE FARMS STANDARD 1.4)  325 mL Oral BID  BM   haloperidol  1 mg Oral BID   hydrocerin   Topical BID   polyethylene glycol  17 g Oral BID   propranolol  10 mg Oral BID   scopolamine  1 patch Transdermal Q72H   Continuous Infusions: PRN Meds:.acetaminophen, alum & mag hydroxide-simeth, fluticasone, hydrOXYzine, LORazepam, magnesium hydroxide, metoprolol tartrate, OLANZapine zydis **AND** ziprasidone, ondansetron, mouth rinse   I have personally reviewed following labs and imaging studies  LABORATORY DATA:  Recent Labs  Lab 03/18/22 0000  WBC 4.9  HGB 13.3  HCT 39.7  PLT 263  MCV 87.6  MCH 29.4  MCHC 33.5  RDW 12.9    Recent Labs  Lab 03/18/22 0000  NA 138  K 3.7  CL 103  CO2 22  GLUCOSE 162*  BUN 13  CREATININE 1.33*  CALCIUM 8.9  AST 22  ALT 19  ALKPHOS 46  BILITOT 0.5  ALBUMIN 3.3*  MG 1.9   Lab Results  Component Value Date   TSH 1.468 01/09/2022    RADIOLOGY STUDIES/RESULTS: No results found.   LOS: 88 days   Signature  Susa Raring M.D on 03/24/2022 at 10:02 AM   -  To page go to [email or web address]

## 2022-03-24 NOTE — Progress Notes (Signed)
Mobility Specialist Progress Note:   03/24/22 1000  Mobility  Activity Ambulated with assistance in hallway  Level of Assistance Contact guard assist, steadying assist  Assistive Device Other (Comment) (HHA)  Distance Ambulated (ft) 500 ft  Activity Response Tolerated well  Mobility Referral Yes  $Mobility charge 1 Mobility   Pt eager for mobility session. Required only HHA for comfort. Pt back in bed with all needs met, eating breakfast.   Nelta Numbers Mobility Specialist Please contact via SecureChat or  Rehab office at [phone]

## 2022-03-25 LAB — COMPREHENSIVE METABOLIC PANEL
ALT: 17 U/L (ref 0–44)
AST: 17 U/L (ref 15–41)
Albumin: 3.3 g/dL — ABNORMAL LOW (ref 3.5–5.0)
Alkaline Phosphatase: 51 U/L (ref 38–126)
Anion gap: 7 (ref 5–15)
BUN: 12 mg/dL (ref 6–20)
CO2: 25 mmol/L (ref 22–32)
Calcium: 9 mg/dL (ref 8.9–10.3)
Chloride: 108 mmol/L (ref 98–111)
Creatinine, Ser: 1.16 mg/dL — ABNORMAL HIGH (ref 0.44–1.00)
GFR, Estimated: 56 mL/min — ABNORMAL LOW (ref >60)
Glucose, Bld: 101 mg/dL — ABNORMAL HIGH (ref 70–99)
Potassium: 4.7 mmol/L (ref 3.5–5.1)
Sodium: 140 mmol/L (ref 135–145)
Total Bilirubin: 0.4 mg/dL (ref 0.3–1.2)
Total Protein: 6.3 g/dL — ABNORMAL LOW (ref 6.5–8.1)

## 2022-03-25 LAB — CBC
HCT: 40.2 % (ref 36.0–46.0)
Hemoglobin: 13.5 g/dL (ref 12.0–15.0)
MCH: 29.5 pg (ref 26.0–34.0)
MCHC: 33.6 g/dL (ref 30.0–36.0)
MCV: 87.8 fL (ref 80.0–100.0)
Platelets: 282 10*3/uL (ref 150–400)
RBC: 4.58 MIL/uL (ref 3.87–5.11)
RDW: 13.1 % (ref 11.5–15.5)
WBC: 4.9 10*3/uL (ref 4.0–10.5)
nRBC: 0 % (ref 0.0–0.2)

## 2022-03-25 LAB — MAGNESIUM: Magnesium: 2.1 mg/dL (ref 1.7–2.4)

## 2022-03-25 NOTE — Progress Notes (Signed)
PROGRESS NOTE        PATIENT DETAILS Name: Natalie Brown Age: 56 y.o. Sex: female Date of Birth: Feb 23, 1966 Admit Date: 12/24/2021 Admitting Physician Dewayne Shorter Levora Dredge, MD [id number], [name], NP  Brief Summary: Patient is a 56 y.o.  female with history of bipolar disorder-who was hospitalized at [facility] received a Ativan challenge for possible catatonia-with minimal improvement-due to persistent altered mental status/tachycardia-patient was admitted to [facility] service for further evaluation and treatment.  No significant finding regarding her tachycardia work-up, 2D echo within normal limit, no significant electrolyte abnormalities.  Evaluated by psychiatry-and unfortunately-patient did not meet criteria to go back to [facility].  While awaiting appropriate disposition-small course complicated by acute COVID-19 infection.  Significant events: 9/4>> transferred to [facility]-from [facility]-Altered-no improvement after Ativan challenge. 9/22>> febrile 9/23>> COVID-19 positive  Significant studies: 9/04>> vitamin B12: 290 9/04>> CXR: No obvious pneumonia 9/04>> MRI brain: No acute intracranial abnormality.  Advanced cerebral/cerebellar atrophy 9/04-9/5>> LTM EEG: No seizures. 9/05>> TSH: 1.3 9/5-9/6>> LTM EEG: Negative. 9/06>> CTA chest: No PE-no PNA. 9/07>> RPR: Nonreactive 9/10>> bilateral lower extremity Doppler: No DVT 9/19>> Echo: EF 60-65%. 9/23>> CXR: No PNA 9/25>> CT head: No acute intracranial process  Significant microbiology data: 9/23>> COVID PCR positive 9/23>> blood culture: Negative  Procedures: None   Consults: Neurology, psychiatry  Subjective:  Patient in bed, appears comfortable, denies any headache, no fever, no chest pain or pressure, no shortness of breath , no abdominal pain. No new focal weakness.   Objective: Vitals: Blood pressure (!) 139/109, pulse 95, temperature 98 F (36.7 C), temperature source Oral, resp. rate 20, height 5\' 6"  (1.676  m), weight 72 kg, SpO2 99 %.    Exam:  Awake but pleasantly confused, CTAB, soft abdomen.  Assessment/Plan:  Acute encephalopathy Hyperactive delirium Dementia Confused Unchanged over the past several days/weeks Thought to have undiagnosed dementia with significant cerebral/cerebellar atrophy on imaging studies.  No evidence of seizures-no further recommendations from neurology/psychiatry.  Elevated D-dimer  CTA chest/lower extremity Dopplers negative for VTE.  COVID-19 infection:  Now asymptomatic.  Dementia:  Significant atrophy on neuroimaging-chronic finding-neurology suspects patient has progressive dementia.   TSH/RPR stable B12 being supplemented.  Outpatient neurology follow-up  Vitamin B12 deficiency:  Continue supplementation.  Bipolar disorder:  Appreciate psych input-continue Haldol .   Per psych requires SNF/memory care.  Sinus tachycardia  Resolved-unclear etiology but thought to be due to dehydration. Continue propranolol  Tobacco abuse: Continue transdermal nicotine  BMI: Estimated body mass index is 25.62 kg/m as calculated from the following:   Height as of this encounter: 5\' 6"  (1.676 m).   Weight as of this encounter: 72 kg.   Code status:   Code Status: Full Code   DVT Prophylaxis: enoxaparin (LOVENOX) injection 40 mg Start: 12/26/21 1200    Family Communication: None at bedside  Disposition Plan: Status is: Inpt Planned Discharge Destination: Memory care/SNF-awaiting bed.  Remains medically stable for transfer.  Diet: Diet Order             Diet regular Room service appropriate? No; Fluid consistency: Thin  Diet effective now                    MEDICATIONS: Scheduled Meds:  (feeding supplement) PROSource Plus  30 mL Oral BID BM   vitamin B-12  1,000 mcg Oral Daily   docusate  sodium  200 mg Oral BID   enoxaparin (LOVENOX) injection  40 mg Subcutaneous Q24H   feeding supplement (KATE FARMS STANDARD 1.4)  325 mL Oral  BID BM   haloperidol  1 mg Oral BID   hydrocerin   Topical BID   polyethylene glycol  17 g Oral BID   propranolol  10 mg Oral BID   scopolamine  1 patch Transdermal Q72H   Continuous Infusions: PRN Meds:.acetaminophen, alum & mag hydroxide-simeth, fluticasone, hydrOXYzine, LORazepam, magnesium hydroxide, metoprolol tartrate, OLANZapine zydis **AND** ziprasidone, ondansetron, mouth rinse   I have personally reviewed following labs and imaging studies  LABORATORY DATA:  Recent Labs  Lab 03/25/22 0840  WBC 4.9  HGB 13.5  HCT 40.2  PLT 282  MCV 87.8  MCH 29.5  MCHC 33.6  RDW 13.1    Recent Labs  Lab 03/25/22 0840  NA 140  K 4.7  CL 108  CO2 25  GLUCOSE 101*  BUN 12  CREATININE 1.16*  CALCIUM 9.0  AST 17  ALT 17  ALKPHOS 51  BILITOT 0.4  ALBUMIN 3.3*  MG 2.1   Lab Results  Component Value Date   TSH 1.468 01/09/2022    RADIOLOGY STUDIES/RESULTS: No results found.   LOS: 89 days   Signature  Susa Raring M.D on 03/25/2022 at 10:23 AM   -  To page go to [email or web address]

## 2022-03-25 NOTE — Progress Notes (Signed)
Mobility Specialist Progress Note:   03/25/22 1013  Mobility  Activity Ambulated with assistance in hallway  Level of Assistance Contact guard assist, steadying assist  Assistive Device Other (Comment) (HHA)  Activity Response Tolerated well  Mobility Referral Yes  $Mobility charge 1 Mobility   Pt received in bed and agreeable. Asymptomatic throughout. Pt left in bed with all needs met, call bell in reach, and bed alarm on.   Andrey Campanile Mobility Specialist Please contact via SecureChat or  Rehab office at [phone]

## 2022-03-26 NOTE — Progress Notes (Signed)
PROGRESS NOTE        PATIENT DETAILS Name: Natalie Brown Age: 56 y.o. Sex: female Date of Birth: [birth year] Admit Date: 12/24/2021 Admitting Physician Dewayne Shorter Levora Dredge, MD [id number], [name], NP  Brief Summary: Patient is a 56 y.o.  female with history of bipolar disorder-who was hospitalized at [facility] received a Ativan challenge for possible catatonia-with minimal improvement-due to persistent altered mental status/tachycardia-patient was admitted to [facility] service for further evaluation and treatment.  No significant finding regarding her tachycardia work-up, 2D echo within normal limit, no significant electrolyte abnormalities.  Evaluated by psychiatry-and unfortunately-patient did not meet criteria to go back to [facility].  While awaiting appropriate disposition-small course complicated by acute COVID-19 infection.  Significant events: 9/4>> transferred to [facility]-from [facility]-Altered-no improvement after Ativan challenge. 9/22>> febrile 9/23>> COVID-19 positive  Significant studies: 9/04>> vitamin B12: 290 9/04>> CXR: No obvious pneumonia 9/04>> MRI brain: No acute intracranial abnormality.  Advanced cerebral/cerebellar atrophy 9/04-9/5>> LTM EEG: No seizures. 9/05>> TSH: 1.3 9/5-9/6>> LTM EEG: Negative. 9/06>> CTA chest: No PE-no PNA. 9/07>> RPR: Nonreactive 9/10>> bilateral lower extremity Doppler: No DVT 9/19>> Echo: EF 60-65%. 9/23>> CXR: No PNA 9/25>> CT head: No acute intracranial process  Significant microbiology data: 9/23>> COVID PCR positive 9/23>> blood culture: Negative  Procedures: None   Consults: Neurology, psychiatry  Subjective:  Patient in bed eating breakfast, appears comfortable, denies any headache, no fever, no chest pain or pressure, no shortness of breath , no abdominal pain. No new focal weakness.   Objective: Vitals: Blood pressure (!) 139/109, pulse 95, temperature 98 F (36.7 C), temperature source Oral, resp. rate 20,  height 5\' 6"  (1.676 m), weight 72 kg, SpO2 99 %.    Exam:  Awake but pleasantly confused, CTAB, soft abdomen.  Assessment/Plan:  Acute encephalopathy Hyperactive delirium Dementia Confused Unchanged over the past several days/weeks Thought to have undiagnosed dementia with significant cerebral/cerebellar atrophy on imaging studies.  No evidence of seizures-no further recommendations from neurology/psychiatry.  Elevated D-dimer  CTA chest/lower extremity Dopplers negative for VTE.  COVID-19 infection:  Now asymptomatic.  Dementia:  Significant atrophy on neuroimaging-chronic finding-neurology suspects patient has progressive dementia.   TSH/RPR stable B12 being supplemented.  Outpatient neurology follow-up  Vitamin B12 deficiency:  Continue supplementation.  Bipolar disorder:  Appreciate psych input-continue Haldol .   Per psych requires SNF/memory care.  Sinus tachycardia  Resolved-unclear etiology but thought to be due to dehydration. Continue propranolol  Tobacco abuse: Continue transdermal nicotine  BMI: Estimated body mass index is 25.62 kg/m as calculated from the following:   Height as of this encounter: 5\' 6"  (1.676 m).   Weight as of this encounter: 72 kg.   Code status:   Code Status: Full Code   DVT Prophylaxis: enoxaparin (LOVENOX) injection 40 mg Start: 12/26/21 1200    Family Communication: None at bedside  Disposition Plan: Status is: Inpt Planned Discharge Destination: Memory care/SNF-awaiting bed.  Remains medically stable for transfer.  Diet: Diet Order             Diet regular Room service appropriate? No; Fluid consistency: Thin  Diet effective now                    MEDICATIONS: Scheduled Meds:  (feeding supplement) PROSource Plus  30 mL Oral BID BM   vitamin B-12  1,000 mcg Oral Daily  docusate sodium  200 mg Oral BID   enoxaparin (LOVENOX) injection  40 mg Subcutaneous Q24H   feeding supplement (KATE FARMS STANDARD  1.4)  325 mL Oral BID BM   haloperidol  1 mg Oral BID   hydrocerin   Topical BID   polyethylene glycol  17 g Oral BID   propranolol  10 mg Oral BID   scopolamine  1 patch Transdermal Q72H   Continuous Infusions: PRN Meds:.acetaminophen, alum & mag hydroxide-simeth, fluticasone, hydrOXYzine, LORazepam, magnesium hydroxide, metoprolol tartrate, OLANZapine zydis **AND** ziprasidone, ondansetron, mouth rinse   I have personally reviewed following labs and imaging studies  LABORATORY DATA:  Recent Labs  Lab 03/25/22 0840  WBC 4.9  HGB 13.5  HCT 40.2  PLT 282  MCV 87.8  MCH 29.5  MCHC 33.6  RDW 13.1    Recent Labs  Lab 03/25/22 0840  NA 140  K 4.7  CL 108  CO2 25  GLUCOSE 101*  BUN 12  CREATININE 1.16*  CALCIUM 9.0  AST 17  ALT 17  ALKPHOS 51  BILITOT 0.4  ALBUMIN 3.3*  MG 2.1   Lab Results  Component Value Date   TSH 1.468 01/09/2022    RADIOLOGY STUDIES/RESULTS: No results found.   LOS: 89 days   Signature  Lala Lund M.D on 03/25/2022 at 10:23 AM   -  To page go to [email or web address]

## 2022-03-26 NOTE — Progress Notes (Signed)
Mobility Specialist Progress Note:   03/26/22 0900  Mobility  Activity Ambulated with assistance in hallway  Level of Assistance Contact guard assist, steadying assist  Assistive Device Other (Comment) (HHA)  Distance Ambulated (ft) 500 ft  Activity Response Tolerated well  Mobility Referral Yes  $Mobility charge 1 Mobility   Pt eager for mobility session. Required only HHA for comfort. Pt back in bed with all needs met.   Nelta Numbers Mobility Specialist Please contact via SecureChat or  Rehab office at [phone]

## 2022-03-26 NOTE — TOC Progression Note (Signed)
Transition of Care [facility]) - Progression Note    Patient Details  Name: Natalie Brown MRN: [id number] Date of Birth: Aug 18, 1965  Transition of Care [facility]) CM/SW Contact  Mearl Latin, LCSW Phone Number: 03/26/2022, 5:02 PM  Clinical Narrative:    CSW continuing to follow. Reached out to Patient Experience for guidance on patient's mother not being allowed to visit patient due to past disruptions.      Barriers to Discharge: Financial Resources, Inadequate or no insurance, Homeless with medical needs  Expected Discharge Plan and Services   In-house Referral: Clinical Social Work   Post Acute Care Choice: Nursing Home Living arrangements for the past 2 months: Homeless [facility]) Expected Discharge Date: 12/24/21                                     Social Determinants of Health (SDOH) Interventions    Readmission Risk Interventions     No data to display

## 2022-03-27 NOTE — Plan of Care (Signed)

## 2022-03-27 NOTE — Progress Notes (Signed)
PROGRESS NOTE        PATIENT DETAILS Name: Natalie Brown Age: 56 y.o. Sex: female Date of Birth: 10/03/1965 Admit Date: 12/24/2021 Admitting Physician Dewayne Shorter Levora Dredge, MD RSW:[id number], [name], NP  Brief Summary: Patient is a 56 y.o.  female with history of bipolar disorder-who was hospitalized at [facility] received a Ativan challenge for possible catatonia-with minimal improvement-due to persistent altered mental status/tachycardia-patient was admitted to [facility] service for further evaluation and treatment.  No significant finding regarding her tachycardia work-up, 2D echo within normal limit, no significant electrolyte abnormalities.  Evaluated by psychiatry-and unfortunately-patient did not meet criteria to go back to [facility].  While awaiting appropriate disposition-small course complicated by acute COVID-19 infection.  Significant events: 9/4>> transferred to [facility]-from [facility]-Altered-no improvement after Ativan challenge. 9/22>> febrile 9/23>> COVID-19 positive  Significant studies: 9/04>> vitamin B12: 290 9/04>> CXR: No obvious pneumonia 9/04>> MRI brain: No acute intracranial abnormality.  Advanced cerebral/cerebellar atrophy 9/04-9/5>> LTM EEG: No seizures. 9/05>> TSH: 1.3 9/5-9/6>> LTM EEG: Negative. 9/06>> CTA chest: No PE-no PNA. 9/07>> RPR: Nonreactive 9/10>> bilateral lower extremity Doppler: No DVT 9/19>> Echo: EF 60-65%. 9/23>> CXR: No PNA 9/25>> CT head: No acute intracranial process  Significant microbiology data: 9/23>> COVID PCR positive 9/23>> blood culture: Negative  Procedures: None   Consults: Neurology, psychiatry  Subjective:  Patient in bed, appears comfortable, denies any headache, no fever, no chest pain or pressure, no shortness of breath , no abdominal pain. No new focal weakness.  Objective: Vitals: Blood pressure 137/76, pulse 81, temperature 98.5 F (36.9 C), temperature source Oral, resp. rate 16, height 5\' 6"  (1.676 m),  weight 72 kg, SpO2 97 %.    Exam:  Awake but pleasantly confused, CTAB, soft abdomen.  Assessment/Plan:  Acute encephalopathy Hyperactive delirium Dementia Confused Unchanged over the past several days/weeks Thought to have undiagnosed dementia with significant cerebral/cerebellar atrophy on imaging studies.  No evidence of seizures-no further recommendations from neurology/psychiatry.  Elevated D-dimer  CTA chest/lower extremity Dopplers negative for VTE.  COVID-19 infection:  Now asymptomatic.  Dementia:  Significant atrophy on neuroimaging-chronic finding-neurology suspects patient has progressive dementia.   TSH/RPR stable B12 being supplemented.  Outpatient neurology follow-up  Vitamin B12 deficiency:  Continue supplementation.  Bipolar disorder:  Appreciate psych input-continue Haldol .   Per psych requires SNF/memory care.  Sinus tachycardia  Resolved-unclear etiology but thought to be due to dehydration. Continue propranolol  Tobacco abuse: Continue transdermal nicotine  BMI: Estimated body mass index is 25.62 kg/m as calculated from the following:   Height as of this encounter: 5\' 6"  (1.676 m).   Weight as of this encounter: 72 kg.   Code status:   Code Status: Full Code   DVT Prophylaxis: enoxaparin (LOVENOX) injection 40 mg Start: 12/26/21 1200    Family Communication: None at bedside  Disposition Plan: Status is: Inpt Planned Discharge Destination: Memory care/SNF-awaiting bed.  Remains medically stable for transfer.  Diet: Diet Order             Diet regular Room service appropriate? No; Fluid consistency: Thin  Diet effective now                    MEDICATIONS: Scheduled Meds:  (feeding supplement) PROSource Plus  30 mL Oral BID BM   vitamin B-12  1,000 mcg Oral Daily   docusate sodium  200 mg Oral BID   enoxaparin (LOVENOX) injection  40 mg Subcutaneous Q24H   feeding supplement (KATE FARMS STANDARD 1.4)  325 mL Oral BID  BM   haloperidol  1 mg Oral BID   hydrocerin   Topical BID   polyethylene glycol  17 g Oral BID   propranolol  10 mg Oral BID   scopolamine  1 patch Transdermal Q72H   Continuous Infusions: PRN Meds:.acetaminophen, alum & mag hydroxide-simeth, fluticasone, hydrOXYzine, LORazepam, magnesium hydroxide, metoprolol tartrate, OLANZapine zydis **AND** ziprasidone, ondansetron, mouth rinse   I have personally reviewed following labs and imaging studies  LABORATORY DATA:  Recent Labs  Lab 03/25/22 0840  WBC 4.9  HGB 13.5  HCT 40.2  PLT 282  MCV 87.8  MCH 29.5  MCHC 33.6  RDW 13.1    Recent Labs  Lab 03/25/22 0840  NA 140  K 4.7  CL 108  CO2 25  GLUCOSE 101*  BUN 12  CREATININE 1.16*  CALCIUM 9.0  AST 17  ALT 17  ALKPHOS 51  BILITOT 0.4  ALBUMIN 3.3*  MG 2.1   Lab Results  Component Value Date   TSH 1.468 01/09/2022    RADIOLOGY STUDIES/RESULTS: No results found.   LOS: 91 days   Signature  Susa Raring M.D on 03/27/2022 at 10:24 AM   -  To page go to [email or web address]

## 2022-03-27 NOTE — Progress Notes (Signed)
Mobility Specialist Progress Note:   03/27/22 1030  Mobility  Activity Ambulated with assistance in hallway  Level of Assistance Contact guard assist, steadying assist  Assistive Device Other (Comment) (HHA)  Distance Ambulated (ft) 500 ft  Activity Response Tolerated well  Mobility Referral Yes  $Mobility charge 1 Mobility   Pt eager to ambulate. HHA for pt preference. Pt asx throughout, left in bed with all needs met. Bed alarm on.  Nelta Numbers Mobility Specialist Please contact via SecureChat or  Rehab office at [phone]

## 2022-03-27 NOTE — Progress Notes (Signed)
Mobility Specialist Progress Note:   03/27/22 1321  Mobility  Activity Ambulated with assistance in hallway  Level of Assistance Contact guard assist, steadying assist  Assistive Device Other (Comment) (HHA)  Distance Ambulated (ft) 200 ft  Activity Response Tolerated well  Mobility Referral Yes  $Mobility charge 1 Mobility   Pt received ambulating in hallway and agreeable. Asymptomatic throughout. Pt left in bed with all needs met and RN in room.   Andrey Campanile Mobility Specialist Please contact via SecureChat or  Rehab office at [phone]

## 2022-03-28 NOTE — Progress Notes (Signed)
Mobility Specialist Progress Note:   03/28/22 1100  Mobility  Activity Ambulated independently in hallway  Level of Assistance Contact guard assist, steadying assist  Assistive Device Other (Comment) (HHA)  Distance Ambulated (ft) 500 ft  Activity Response Tolerated well  Mobility Referral Yes  $Mobility charge 1 Mobility   Pt was eager for mobility. HHA for pt comfort. Left pt in bed w/ all needs met.  Pre Mobility: During Mobility: Post Mobility:  Nelta Numbers Mobility Specialist Please contact via SecureChat or  Rehab office at [phone]

## 2022-03-28 NOTE — Progress Notes (Signed)
PROGRESS NOTE        PATIENT DETAILS Name: Natalie Brown Age: 56 y.o. Sex: female Date of Birth: 10/03/1965 Admit Date: 12/24/2021 Admitting Physician Dewayne Shorter Levora Dredge, MD RSW:[id number], [name], NP  Brief Summary: Patient is a 56 y.o.  female with history of bipolar disorder-who was hospitalized at [facility] received a Ativan challenge for possible catatonia-with minimal improvement-due to persistent altered mental status/tachycardia-patient was admitted to [facility] service for further evaluation and treatment.  No significant finding regarding her tachycardia work-up, 2D echo within normal limit, no significant electrolyte abnormalities.  Evaluated by psychiatry-and unfortunately-patient did not meet criteria to go back to [facility].  While awaiting appropriate disposition-small course complicated by acute COVID-19 infection.  Significant events: 9/4>> transferred to [facility]-from [facility]-Altered-no improvement after Ativan challenge. 9/22>> febrile 9/23>> COVID-19 positive  Significant studies: 9/04>> vitamin B12: 290 9/04>> CXR: No obvious pneumonia 9/04>> MRI brain: No acute intracranial abnormality.  Advanced cerebral/cerebellar atrophy 9/04-9/5>> LTM EEG: No seizures. 9/05>> TSH: 1.3 9/5-9/6>> LTM EEG: Negative. 9/06>> CTA chest: No PE-no PNA. 9/07>> RPR: Nonreactive 9/10>> bilateral lower extremity Doppler: No DVT 9/19>> Echo: EF 60-65%. 9/23>> CXR: No PNA 9/25>> CT head: No acute intracranial process  Significant microbiology data: 9/23>> COVID PCR positive 9/23>> blood culture: Negative  Procedures: None   Consults: Neurology, psychiatry  Subjective:  Patient in bed, appears comfortable, denies any headache, no fever, no chest pain or pressure, no shortness of breath , no abdominal pain. No new focal weakness.  Objective: Vitals: Blood pressure 137/76, pulse 81, temperature 98.5 F (36.9 C), temperature source Oral, resp. rate 16, height 5\' 6"  (1.676 m),  weight 72 kg, SpO2 97 %.    Exam:  Awake but pleasantly confused, CTAB, soft abdomen.  Assessment/Plan:  Acute encephalopathy Hyperactive delirium Dementia Confused Unchanged over the past several days/weeks Thought to have undiagnosed dementia with significant cerebral/cerebellar atrophy on imaging studies.  No evidence of seizures-no further recommendations from neurology/psychiatry.  Elevated D-dimer  CTA chest/lower extremity Dopplers negative for VTE.  COVID-19 infection:  Now asymptomatic.  Dementia:  Significant atrophy on neuroimaging-chronic finding-neurology suspects patient has progressive dementia.   TSH/RPR stable B12 being supplemented.  Outpatient neurology follow-up  Vitamin B12 deficiency:  Continue supplementation.  Bipolar disorder:  Appreciate psych input-continue Haldol .   Per psych requires SNF/memory care.  Sinus tachycardia  Resolved-unclear etiology but thought to be due to dehydration. Continue propranolol  Tobacco abuse: Continue transdermal nicotine  BMI: Estimated body mass index is 25.62 kg/m as calculated from the following:   Height as of this encounter: 5\' 6"  (1.676 m).   Weight as of this encounter: 72 kg.   Code status:   Code Status: Full Code   DVT Prophylaxis: enoxaparin (LOVENOX) injection 40 mg Start: 12/26/21 1200    Family Communication: None at bedside  Disposition Plan: Status is: Inpt Planned Discharge Destination: Memory care/SNF-awaiting bed.  Remains medically stable for transfer.  Diet: Diet Order             Diet regular Room service appropriate? No; Fluid consistency: Thin  Diet effective now                    MEDICATIONS: Scheduled Meds:  (feeding supplement) PROSource Plus  30 mL Oral BID BM   vitamin B-12  1,000 mcg Oral Daily   docusate sodium  200 mg Oral BID   enoxaparin (LOVENOX) injection  40 mg Subcutaneous Q24H   feeding supplement (KATE FARMS STANDARD 1.4)  325 mL Oral BID  BM   haloperidol  1 mg Oral BID   hydrocerin   Topical BID   polyethylene glycol  17 g Oral BID   propranolol  10 mg Oral BID   scopolamine  1 patch Transdermal Q72H   Continuous Infusions: PRN Meds:.acetaminophen, alum & mag hydroxide-simeth, fluticasone, hydrOXYzine, LORazepam, magnesium hydroxide, metoprolol tartrate, OLANZapine zydis **AND** ziprasidone, ondansetron, mouth rinse   I have personally reviewed following labs and imaging studies  LABORATORY DATA:  Recent Labs  Lab 03/25/22 0840  WBC 4.9  HGB 13.5  HCT 40.2  PLT 282  MCV 87.8  MCH 29.5  MCHC 33.6  RDW 13.1    Recent Labs  Lab 03/25/22 0840  NA 140  K 4.7  CL 108  CO2 25  GLUCOSE 101*  BUN 12  CREATININE 1.16*  CALCIUM 9.0  AST 17  ALT 17  ALKPHOS 51  BILITOT 0.4  ALBUMIN 3.3*  MG 2.1   Lab Results  Component Value Date   TSH 1.468 01/09/2022    RADIOLOGY STUDIES/RESULTS: No results found.   LOS: 91 days   Signature  Susa Raring M.D on 03/27/2022 at 10:24 AM   -  To page go to [email or web address]

## 2022-03-29 NOTE — Progress Notes (Signed)
PROGRESS NOTE        PATIENT DETAILS Name: Natalie Brown Age: 56 y.o. Sex: female Date of Birth: 02-17-1966 Admit Date: 12/24/2021 Admitting Physician Dewayne Shorter Levora Dredge, MD [id number], [name], NP  Brief Summary: Patient is a 56 y.o.  female with history of bipolar disorder-who was hospitalized at [facility] received a Ativan challenge for possible catatonia-with minimal improvement-due to persistent altered mental status/tachycardia-patient was admitted to [facility] service for further evaluation and treatment.  No significant finding regarding her tachycardia work-up, 2D echo within normal limit, no significant electrolyte abnormalities.  Evaluated by psychiatry-and unfortunately-patient did not meet criteria to go back to [facility].  While awaiting appropriate disposition-small course complicated by acute COVID-19 infection.  Significant events: 9/4>> transferred to [facility]-from [facility]-Altered-no improvement after Ativan challenge. 9/22>> febrile 9/23>> COVID-19 positive  Significant studies: 9/04>> vitamin B12: 290 9/04>> CXR: No obvious pneumonia 9/04>> MRI brain: No acute intracranial abnormality.  Advanced cerebral/cerebellar atrophy 9/04-9/5>> LTM EEG: No seizures. 9/05>> TSH: 1.3 9/5-9/6>> LTM EEG: Negative. 9/06>> CTA chest: No PE-no PNA. 9/07>> RPR: Nonreactive 9/10>> bilateral lower extremity Doppler: No DVT 9/19>> Echo: EF 60-65%. 9/23>> CXR: No PNA 9/25>> CT head: No acute intracranial process  Significant microbiology data: 9/23>> COVID PCR positive 9/23>> blood culture: Negative  Procedures: None   Consults: Neurology, psychiatry  Subjective:  Patient in bed, appears comfortable, denies any headache, no fever, no chest pain or pressure, no shortness of breath , no abdominal pain. No new focal weakness.   Objective: Vitals: Blood pressure 124/79, pulse 83, temperature 97.6 F (36.4 C), temperature source Axillary, resp. rate 18, height 5\' 6"   (1.676 m), weight 72 kg, SpO2 97 %.    Exam:  Awake but pleasantly confused, CTAB, soft abdomen.  Assessment/Plan:  Acute encephalopathy Hyperactive delirium Dementia Confused Unchanged over the past several days/weeks Thought to have undiagnosed dementia with significant cerebral/cerebellar atrophy on imaging studies.  No evidence of seizures-no further recommendations from neurology/psychiatry.  Elevated D-dimer  CTA chest/lower extremity Dopplers negative for VTE.  COVID-19 infection:  Now asymptomatic.  Dementia:  Significant atrophy on neuroimaging-chronic finding-neurology suspects patient has progressive dementia.   TSH/RPR stable B12 being supplemented.  Outpatient neurology follow-up  Vitamin B12 deficiency:  Continue supplementation.  Bipolar disorder:  Appreciate psych input-continue Haldol .   Per psych requires SNF/memory care.  Sinus tachycardia  Resolved-unclear etiology but thought to be due to dehydration. Continue propranolol  Tobacco abuse: Continue transdermal nicotine  BMI: Estimated body mass index is 25.62 kg/m as calculated from the following:   Height as of this encounter: 5\' 6"  (1.676 m).   Weight as of this encounter: 72 kg.   Code status:   Code Status: Full Code   DVT Prophylaxis: enoxaparin (LOVENOX) injection 40 mg Start: 12/26/21 1200    Family Communication: None at bedside  Disposition Plan: Status is: Inpt Planned Discharge Destination: Memory care/SNF-awaiting bed.  Remains medically stable for transfer.  Diet: Diet Order             Diet regular Room service appropriate? No; Fluid consistency: Thin  Diet effective now                    MEDICATIONS: Scheduled Meds:  (feeding supplement) PROSource Plus  30 mL Oral BID BM   vitamin B-12  1,000 mcg Oral Daily   docusate sodium  200 mg Oral BID   enoxaparin (LOVENOX) injection  40 mg Subcutaneous Q24H   feeding supplement (KATE FARMS STANDARD 1.4)  325 mL  Oral BID BM   haloperidol  1 mg Oral BID   hydrocerin   Topical BID   polyethylene glycol  17 g Oral BID   propranolol  10 mg Oral BID   scopolamine  1 patch Transdermal Q72H   Continuous Infusions: PRN Meds:.acetaminophen, alum & mag hydroxide-simeth, fluticasone, hydrOXYzine, LORazepam, magnesium hydroxide, metoprolol tartrate, OLANZapine zydis **AND** ziprasidone, ondansetron, mouth rinse   I have personally reviewed following labs and imaging studies  LABORATORY DATA:  Recent Labs  Lab 03/25/22 0840  WBC 4.9  HGB 13.5  HCT 40.2  PLT 282  MCV 87.8  MCH 29.5  MCHC 33.6  RDW 13.1    Recent Labs  Lab 03/25/22 0840  NA 140  K 4.7  CL 108  CO2 25  GLUCOSE 101*  BUN 12  CREATININE 1.16*  CALCIUM 9.0  AST 17  ALT 17  ALKPHOS 51  BILITOT 0.4  ALBUMIN 3.3*  MG 2.1   Lab Results  Component Value Date   TSH 1.468 01/09/2022    RADIOLOGY STUDIES/RESULTS: No results found.   LOS: 93 days   Signature  Susa Raring M.D on 03/29/2022 at 10:55 AM   -  To page go to [email or web address]

## 2022-03-29 NOTE — Progress Notes (Signed)
Mobility Specialist Progress Note:   03/29/22 1050  Mobility  Activity Ambulated with assistance in hallway  Level of Assistance Contact guard assist, steadying assist  Assistive Device Other (Comment) (HHA)  Distance Ambulated (ft) 500 ft  Activity Response Tolerated well  Mobility Referral Yes  $Mobility charge 1 Mobility   Pt eager for mobility session. Required only HHA for comfort. Pt back in bed with all needs met.   Nelta Numbers Mobility Specialist Please contact via SecureChat or  Rehab office at [phone]

## 2022-03-30 NOTE — Progress Notes (Signed)
PROGRESS NOTE        PATIENT DETAILS Name: Natalie Brown Age: 56 y.o. Sex: female Date of Birth: 1965-05-11 Admit Date: 12/24/2021 Admitting Physician Dewayne Shorter Levora Dredge, MD RCB:[id number], [name], NP  Brief Summary: Patient is a 56 y.o.  female with history of bipolar disorder-who was hospitalized at [facility] received a Ativan challenge for possible catatonia-with minimal improvement-due to persistent altered mental status/tachycardia-patient was admitted to [facility] service for further evaluation and treatment.  No significant finding regarding her tachycardia work-up, 2D echo within normal limit, no significant electrolyte abnormalities.  Evaluated by psychiatry-and unfortunately-patient did not meet criteria to go back to [facility].  While awaiting appropriate disposition-small course complicated by acute COVID-19 infection.  Significant events: 9/4>> transferred to [facility]-from [facility]-Altered-no improvement after Ativan challenge. 9/22>> febrile 9/23>> COVID-19 positive  Significant studies: 9/04>> vitamin B12: 290 9/04>> CXR: No obvious pneumonia 9/04>> MRI brain: No acute intracranial abnormality.  Advanced cerebral/cerebellar atrophy 9/04-9/5>> LTM EEG: No seizures. 9/05>> TSH: 1.3 9/5-9/6>> LTM EEG: Negative. 9/06>> CTA chest: No PE-no PNA. 9/07>> RPR: Nonreactive 9/10>> bilateral lower extremity Doppler: No DVT 9/19>> Echo: EF 60-65%. 9/23>> CXR: No PNA 9/25>> CT head: No acute intracranial process  Significant microbiology data: 9/23>> COVID PCR positive 9/23>> blood culture: Negative  Procedures: None   Consults: Neurology, psychiatry  Subjective: Patient sitting up in bed having breakfast, no headache or chest pain.   Objective: Vitals: Blood pressure 117/81, pulse 85, temperature 97.8 F (36.6 C), temperature source Axillary, resp. rate 16, height 5\' 6"  (1.676 m), weight 72 kg, SpO2 97 %.    Exam:  Awake but pleasantly confused, CTAB, soft  abdomen.  Assessment/Plan:  Acute encephalopathy Hyperactive delirium Dementia Confused Unchanged over the past several days/weeks Thought to have undiagnosed dementia with significant cerebral/cerebellar atrophy on imaging studies.  No evidence of seizures-no further recommendations from neurology/psychiatry.  Elevated D-dimer  CTA chest/lower extremity Dopplers negative for VTE.  COVID-19 infection:  Now asymptomatic.  Dementia:  Significant atrophy on neuroimaging-chronic finding-neurology suspects patient has progressive dementia.   TSH/RPR stable B12 being supplemented.  Outpatient neurology follow-up  Vitamin B12 deficiency:  Continue supplementation.  Bipolar disorder:  Appreciate psych input-continue Haldol .   Per psych requires SNF/memory care.  Sinus tachycardia  Resolved-unclear etiology but thought to be due to dehydration. Continue propranolol  Tobacco abuse: Continue transdermal nicotine  BMI: Estimated body mass index is 25.62 kg/m as calculated from the following:   Height as of this encounter: 5\' 6"  (1.676 m).   Weight as of this encounter: 72 kg.   Code status:   Code Status: Full Code   DVT Prophylaxis: enoxaparin (LOVENOX) injection 40 mg Start: 12/26/21 1200    Family Communication: None at bedside  Disposition Plan: Status is: Inpt Planned Discharge Destination: Memory care/SNF-awaiting bed.  Remains medically stable for transfer.  Diet: Diet Order             Diet regular Room service appropriate? No; Fluid consistency: Thin  Diet effective now                    MEDICATIONS: Scheduled Meds:  (feeding supplement) PROSource Plus  30 mL Oral BID BM   vitamin B-12  1,000 mcg Oral Daily   docusate sodium  200 mg Oral BID   enoxaparin (LOVENOX) injection  40 mg Subcutaneous Q24H  feeding supplement (KATE FARMS STANDARD 1.4)  325 mL Oral BID BM   haloperidol  1 mg Oral BID   hydrocerin   Topical BID   polyethylene glycol   17 g Oral BID   propranolol  10 mg Oral BID   scopolamine  1 patch Transdermal Q72H   Continuous Infusions: PRN Meds:.acetaminophen, alum & mag hydroxide-simeth, fluticasone, hydrOXYzine, LORazepam, magnesium hydroxide, metoprolol tartrate, OLANZapine zydis **AND** ziprasidone, ondansetron, mouth rinse   I have personally reviewed following labs and imaging studies  LABORATORY DATA:  Recent Labs  Lab 03/25/22 0840  WBC 4.9  HGB 13.5  HCT 40.2  PLT 282  MCV 87.8  MCH 29.5  MCHC 33.6  RDW 13.1    Recent Labs  Lab 03/25/22 0840  NA 140  K 4.7  CL 108  CO2 25  GLUCOSE 101*  BUN 12  CREATININE 1.16*  CALCIUM 9.0  AST 17  ALT 17  ALKPHOS 51  BILITOT 0.4  ALBUMIN 3.3*  MG 2.1   Lab Results  Component Value Date   TSH 1.468 01/09/2022    RADIOLOGY STUDIES/RESULTS: No results found.   LOS: 94 days   Signature  Susa Raring M.D on 03/30/2022 at 9:42 AM   -  To page go to [email or web address]

## 2022-03-30 NOTE — Progress Notes (Signed)
Mobility Specialist Progress Note:   03/30/22 1053  Mobility  Activity Ambulated with assistance in hallway  Level of Assistance Contact guard assist, steadying assist  Assistive Device Other (Comment) (HHA)  Distance Ambulated (ft) 500 ft  Activity Response Tolerated well  Mobility Referral Yes  $Mobility charge 1 Mobility   Pt received in bed and agreeable. Asymptomatic throughout ambulation. Pt left in bed with all needs met and call bell in reach.   Andrey Campanile Mobility Specialist Please contact via SecureChat or  Rehab office at [phone]

## 2022-03-31 NOTE — Progress Notes (Signed)
PROGRESS NOTE        PATIENT DETAILS Name: Natalie Brown Age: 56 y.o. Sex: female Date of Birth: 08-Aug-1965 Admit Date: 12/24/2021 Admitting Physician Dewayne Shorter Levora Dredge, MD NID:[id number], [name], NP  Brief Summary: Patient is a 56 y.o.  female with history of bipolar disorder-who was hospitalized at [facility] received a Ativan challenge for possible catatonia-with minimal improvement-due to persistent altered mental status/tachycardia-patient was admitted to [facility] service for further evaluation and treatment.  No significant finding regarding her tachycardia work-up, 2D echo within normal limit, no significant electrolyte abnormalities.  Evaluated by psychiatry-and unfortunately-patient did not meet criteria to go back to [facility].  While awaiting appropriate disposition-small course complicated by acute COVID-19 infection.  Significant events: 9/4>> transferred to [facility]-from [facility]-Altered-no improvement after Ativan challenge. 9/22>> febrile 9/23>> COVID-19 positive  Significant studies: 9/04>> vitamin B12: 290 9/04>> CXR: No obvious pneumonia 9/04>> MRI brain: No acute intracranial abnormality.  Advanced cerebral/cerebellar atrophy 9/04-9/5>> LTM EEG: No seizures. 9/05>> TSH: 1.3 9/5-9/6>> LTM EEG: Negative. 9/06>> CTA chest: No PE-no PNA. 9/07>> RPR: Nonreactive 9/10>> bilateral lower extremity Doppler: No DVT 9/19>> Echo: EF 60-65%. 9/23>> CXR: No PNA 9/25>> CT head: No acute intracranial process  Significant microbiology data: 9/23>> COVID PCR positive 9/23>> blood culture: Negative  Procedures: None   Consults: Neurology, psychiatry  Subjective: Patient in bed, appears comfortable, having breakfast denies any headache or chest pain.  No abdominal pain.    Objective: Vitals: Blood pressure 92/64, pulse 88, temperature 98.1 F (36.7 C), temperature source Axillary, resp. rate 16, height 5\' 6"  (1.676 m), weight 72 kg, SpO2 98 %.    Exam:  Awake  but pleasantly confused, CTAB, soft abdomen.  Assessment/Plan:  Acute encephalopathy Hyperactive delirium Dementia Confused Unchanged over the past several days/weeks Thought to have undiagnosed dementia with significant cerebral/cerebellar atrophy on imaging studies.  No evidence of seizures-no further recommendations from neurology/psychiatry.  Elevated D-dimer  CTA chest/lower extremity Dopplers negative for VTE.  COVID-19 infection:  Now asymptomatic.  Dementia:  Significant atrophy on neuroimaging-chronic finding-neurology suspects patient has progressive dementia.   TSH/RPR stable B12 being supplemented.  Outpatient neurology follow-up  Vitamin B12 deficiency:  Continue supplementation.  Bipolar disorder:  Appreciate psych input-continue Haldol .   Per psych requires SNF/memory care.  Sinus tachycardia  Resolved-unclear etiology but thought to be due to dehydration. Continue propranolol  Tobacco abuse: Continue transdermal nicotine  BMI: Estimated body mass index is 25.62 kg/m as calculated from the following:   Height as of this encounter: 5\' 6"  (1.676 m).   Weight as of this encounter: 72 kg.   Code status:   Code Status: Full Code   DVT Prophylaxis: enoxaparin (LOVENOX) injection 40 mg Start: 12/26/21 1200    Family Communication: None at bedside  Disposition Plan: Status is: Inpt Planned Discharge Destination: Memory care/SNF-awaiting bed.  Remains medically stable for transfer.  Diet: Diet Order             Diet regular Room service appropriate? No; Fluid consistency: Thin  Diet effective now                    MEDICATIONS: Scheduled Meds:  (feeding supplement) PROSource Plus  30 mL Oral BID BM   vitamin B-12  1,000 mcg Oral Daily   docusate sodium  200 mg Oral BID   enoxaparin (LOVENOX) injection  40 mg Subcutaneous Q24H   feeding supplement (KATE FARMS STANDARD 1.4)  325 mL Oral BID BM   haloperidol  1 mg Oral BID   hydrocerin    Topical BID   polyethylene glycol  17 g Oral BID   propranolol  10 mg Oral BID   scopolamine  1 patch Transdermal Q72H   Continuous Infusions: PRN Meds:.acetaminophen, alum & mag hydroxide-simeth, fluticasone, hydrOXYzine, LORazepam, magnesium hydroxide, metoprolol tartrate, OLANZapine zydis **AND** ziprasidone, ondansetron, mouth rinse   I have personally reviewed following labs and imaging studies  LABORATORY DATA:  Recent Labs  Lab 03/25/22 0840  WBC 4.9  HGB 13.5  HCT 40.2  PLT 282  MCV 87.8  MCH 29.5  MCHC 33.6  RDW 13.1    Recent Labs  Lab 03/25/22 0840  NA 140  K 4.7  CL 108  CO2 25  GLUCOSE 101*  BUN 12  CREATININE 1.16*  CALCIUM 9.0  AST 17  ALT 17  ALKPHOS 51  BILITOT 0.4  ALBUMIN 3.3*  MG 2.1   Lab Results  Component Value Date   TSH 1.468 01/09/2022    RADIOLOGY STUDIES/RESULTS: No results found.   LOS: 95 days   Signature  Susa Raring M.D on 03/31/2022 at 9:05 AM   -  To page go to [email or web address]

## 2022-03-31 NOTE — Progress Notes (Signed)
Mobility Specialist Progress Note:   03/31/22 1107  Mobility  Activity Ambulated with assistance in hallway  Level of Assistance Contact guard assist, steadying assist  Assistive Device Other (Comment) (HHA)  Distance Ambulated (ft) 500 ft  Activity Response Tolerated well  Mobility Referral Yes  $Mobility charge 1 Mobility   Pt received in bed and agreeable. Asymptomatic throughout session. Pt left in bed with all needs met and call bell in reach.   Andrey Campanile Mobility Specialist Please contact via SecureChat or  Rehab office at [phone]

## 2022-04-01 LAB — COMPREHENSIVE METABOLIC PANEL
ALT: 15 U/L (ref 0–44)
AST: 20 U/L (ref 15–41)
Albumin: 3.4 g/dL — ABNORMAL LOW (ref 3.5–5.0)
Alkaline Phosphatase: 57 U/L (ref 38–126)
Anion gap: 11 (ref 5–15)
BUN: 16 mg/dL (ref 6–20)
CO2: 23 mmol/L (ref 22–32)
Calcium: 8.6 mg/dL — ABNORMAL LOW (ref 8.9–10.3)
Chloride: 104 mmol/L (ref 98–111)
Creatinine, Ser: 1.42 mg/dL — ABNORMAL HIGH (ref 0.44–1.00)
GFR, Estimated: 44 mL/min — ABNORMAL LOW (ref >60)
Glucose, Bld: 118 mg/dL — ABNORMAL HIGH (ref 70–99)
Potassium: 3.7 mmol/L (ref 3.5–5.1)
Sodium: 138 mmol/L (ref 135–145)
Total Bilirubin: 0.2 mg/dL — ABNORMAL LOW (ref 0.3–1.2)
Total Protein: 6.5 g/dL (ref 6.5–8.1)

## 2022-04-01 LAB — CBC
HCT: 38.8 % (ref 36.0–46.0)
Hemoglobin: 12.9 g/dL (ref 12.0–15.0)
MCH: 28.8 pg (ref 26.0–34.0)
MCHC: 33.2 g/dL (ref 30.0–36.0)
MCV: 86.6 fL (ref 80.0–100.0)
Platelets: 296 10*3/uL (ref 150–400)
RBC: 4.48 MIL/uL (ref 3.87–5.11)
RDW: 12.8 % (ref 11.5–15.5)
WBC: 5.8 10*3/uL (ref 4.0–10.5)
nRBC: 0 % (ref 0.0–0.2)

## 2022-04-01 LAB — MAGNESIUM: Magnesium: 1.9 mg/dL (ref 1.7–2.4)

## 2022-04-01 LAB — TSH: TSH: 0.887 u[IU]/mL (ref 0.350–4.500)

## 2022-04-01 LAB — VITAMIN B12: Vitamin B-12: 1195 pg/mL — ABNORMAL HIGH (ref 180–914)

## 2022-04-01 MED ORDER — HALOPERIDOL LACTATE 5 MG/ML IJ SOLN
2.0000 mg | Freq: Once | INTRAMUSCULAR | Status: AC
  Administered 2022-04-01: 2 mg via INTRAMUSCULAR
  Filled 2022-04-01: qty 1

## 2022-04-01 MED ORDER — LACTATED RINGERS IV SOLN: INTRAVENOUS | Status: AC

## 2022-04-01 MED ORDER — LACTATED RINGERS IV SOLN: INTRAVENOUS | Status: DC

## 2022-04-01 NOTE — Progress Notes (Signed)
Mobility Specialist Progress Note:   04/01/22 1005  Mobility  Activity Ambulated with assistance in hallway  Level of Assistance Contact guard assist, steadying assist  Assistive Device Other (Comment) (IV Pole + HHA)  Distance Ambulated (ft) 500 ft  Activity Response Tolerated well  Mobility Referral Yes  $Mobility charge 1 Mobility   Pt eager for mobility session. Displayed difficulty with maneuvering IV pole. Tolerated ambulation well. Pt back in bed with all needs met, bed alarm on.    Mobility Specialist Please contact via SecureChat or  Rehab office at [phone]  

## 2022-04-01 NOTE — Progress Notes (Signed)
Mobility Specialist Progress Note:   04/01/22 0900  Mobility  Activity Ambulated independently in room;Ambulated with assistance in room  Level of Assistance Standby assist, set-up cues, supervision of patient - no hands on  Assistive Device None  Distance Ambulated (ft) 50 ft  Activity Response Tolerated well  Mobility Referral Yes  $Mobility charge 1 Mobility   Responded to bed alarm. Pt found ambulating in room, with new IV placed. Redirected back to bed, and educated pt on not touching IV site/pole, pt verbalized understanding. Left sitting up in bed eating breakfast, bed alarm on.   Addison Lank Mobility Specialist Please contact via SecureChat or  Rehab office at [phone]

## 2022-04-01 NOTE — TOC Progression Note (Signed)
Transition of Care [facility]) - Progression Note    Patient Details  Name: Natalie Brown MRN: [id number] Date of Birth: [birth year]  Transition of Care [facility]) CM/SW Contact  Mearl Latin, LCSW Phone Number: 04/01/2022, 9:39 AM  Clinical Narrative:    CSW continuing to follow. Placement barriers still in place.      Barriers to Discharge: Financial Resources, Inadequate or no insurance, Homeless with medical needs  Expected Discharge Plan and Services   In-house Referral: Clinical Social Work   Post Acute Care Choice: Nursing Home Living arrangements for the past 2 months: Homeless [facility]) Expected Discharge Date: 12/24/21                                     Social Determinants of Health (SDOH) Interventions Housing Interventions: Inpatient TOC (Requires facility placement)  Readmission Risk Interventions     No data to display

## 2022-04-01 NOTE — Progress Notes (Signed)
PROGRESS NOTE        PATIENT DETAILS Name: Natalie Brown Age: 56 y.o. Sex: female Date of Birth: Sep 13, 1965 Admit Date: 12/24/2021 Admitting Physician Evalee Mutton Kristeen Mans, MD [id number], [name], NP  Brief Summary: Patient is a 56 y.o.  female with history of bipolar disorder-who was hospitalized at [facility] received a Ativan challenge for possible catatonia-with minimal improvement-due to persistent altered mental status/tachycardia-patient was admitted to [facility] service for further evaluation and treatment.  No significant finding regarding her tachycardia work-up, 2D echo within normal limit, no significant electrolyte abnormalities.  Evaluated by psychiatry-and unfortunately-patient did not meet criteria to go back to [facility].  While awaiting appropriate disposition-small course complicated by acute [id number] infection.  Significant events: 9/4>> transferred to [facility]-from [facility]-Altered-no improvement after Ativan challenge. 9/22>> febrile 9/23>> COVID-19 positive  Significant studies: 9/04>> vitamin B12: 290 9/04>> CXR: No obvious pneumonia 9/04>> MRI brain: No acute intracranial abnormality.  Advanced cerebral/cerebellar atrophy 9/04-9/5>> LTM EEG: No seizures. 9/05>> TSH: 1.3 9/5-9/6>> LTM EEG: Negative. 9/06>> CTA chest: No PE-no PNA. 9/07>> RPR: Nonreactive 9/10>> bilateral lower extremity Doppler: No DVT 9/19>> Echo: EF 60-65%. 9/23>> CXR: No PNA 9/25>> CT head: No acute intracranial process  Significant microbiology data: 9/23>> COVID PCR positive 9/23>> blood culture: Negative  Procedures: None   Consults: Neurology, psychiatry  Subjective: Patient in bed appears to be in no distress trying to eat breakfast, denies any headache or chest pain. Objective: Vitals: Blood pressure (!) 146/91, pulse 100, temperature 99 F (37.2 C), temperature source Oral, resp. rate 18, height 5\' 6"  (1.676 m), weight 72 kg, SpO2 94 %.    Exam:  Awake but  pleasantly confused, CTAB, soft abdomen.  Assessment/Plan:  Acute encephalopathy, Hyperactive delirium, Dementia Confused, Unchanged over the past several days/weeks Thought to have undiagnosed dementia with significant cerebral/cerebellar atrophy on imaging studies.  Significant atrophy on neuroimaging-chronic finding-neurology suspects patient has progressive dementia.   TSH/RPR stable B12 being supplemented.  Outpatient neurology follow-up No evidence of seizures-no further recommendations from neurology/psychiatry.   Bipolar disorder:  Appreciate psych input-continue Haldol .Per psych requires SNF/memory care.  Elevated D-dimer  - CTA chest/lower extremity Dopplers negative for VTE.  COVID-19 infection:  Now asymptomatic.  Resolved clinically.  AKI on CKD stage IIIa.  Baseline creatinine around 1.3, slightly dehydrated from decreased oral intake, staff requested to assist patient with eating, gentle IV fluids on 04/02/2022 and monitor.  She may not cooperate with IV placement due to her underlying dementia and confusion, Haldol and mittens might be needed while she is getting IV fluids and IV placement.    Vitamin B12 deficiency:  Continue supplementation.  Sinus tachycardia  Resolved-unclear etiology but thought to be due to dehydration. Continue propranolol  Tobacco abuse: Continue transdermal nicotine  BMI: Estimated body mass index is 25.62 kg/m as calculated from the following:   Height as of this encounter: 5\' 6"  (1.676 m).   Weight as of this encounter: 72 kg.   Code status:   Code Status: Full Code   DVT Prophylaxis: enoxaparin (LOVENOX) injection 40 mg Start: 12/26/21 1200    Family Communication: None at bedside  Disposition Plan: Status is: Inpt Planned Discharge Destination: Memory care/SNF-awaiting bed.  Remains medically stable for transfer.  Diet: Diet Order             Diet regular Room service appropriate? No; Fluid  consistency: Thin  Diet  effective now                    MEDICATIONS: Scheduled Meds:  (feeding supplement) PROSource Plus  30 mL Oral BID BM   vitamin B-12  1,000 mcg Oral Daily   docusate sodium  200 mg Oral BID   enoxaparin (LOVENOX) injection  40 mg Subcutaneous Q24H   feeding supplement (KATE FARMS STANDARD 1.4)  325 mL Oral BID BM   haloperidol  1 mg Oral BID   haloperidol lactate  2 mg Intramuscular Once   hydrocerin   Topical BID   polyethylene glycol  17 g Oral BID   propranolol  10 mg Oral BID   scopolamine  1 patch Transdermal Q72H   Continuous Infusions:  lactated ringers     PRN Meds:.acetaminophen, alum & mag hydroxide-simeth, fluticasone, hydrOXYzine, LORazepam, magnesium hydroxide, metoprolol tartrate, OLANZapine zydis **AND** ziprasidone, ondansetron, mouth rinse   I have personally reviewed following labs and imaging studies  LABORATORY DATA:  Recent Labs  Lab 03/25/22 0840 04/01/22 0016  WBC 4.9 5.8  HGB 13.5 12.9  HCT 40.2 38.8  PLT 282 296  MCV 87.8 86.6  MCH 29.5 28.8  MCHC 33.6 33.2  RDW 13.1 12.8    Recent Labs  Lab 03/25/22 0840 04/01/22 0016  NA 140 138  K 4.7 3.7  CL 108 104  CO2 25 23  GLUCOSE 101* 118*  BUN 12 16  CREATININE 1.16* 1.42*  CALCIUM 9.0 8.6*  AST 17 20  ALT 17 15  ALKPHOS 51 57  BILITOT 0.4 0.2*  ALBUMIN 3.3* 3.4*  MG 2.1 1.9   Lab Results  Component Value Date   TSH 1.468 01/09/2022    RADIOLOGY STUDIES/RESULTS: No results found.   LOS: 96 days   Signature  Susa Raring M.D on 04/01/2022 at 8:00 AM   -  To page go to [email or web address]

## 2022-04-02 LAB — BASIC METABOLIC PANEL
Anion gap: 9 (ref 5–15)
BUN: 8 mg/dL (ref 6–20)
CO2: 23 mmol/L (ref 22–32)
Calcium: 8.8 mg/dL — ABNORMAL LOW (ref 8.9–10.3)
Chloride: 108 mmol/L (ref 98–111)
Creatinine, Ser: 1.11 mg/dL — ABNORMAL HIGH (ref 0.44–1.00)
GFR, Estimated: 59 mL/min — ABNORMAL LOW (ref >60)
Glucose, Bld: 89 mg/dL (ref 70–99)
Potassium: 4.3 mmol/L (ref 3.5–5.1)
Sodium: 140 mmol/L (ref 135–145)

## 2022-04-02 NOTE — Progress Notes (Signed)
PROGRESS NOTE        PATIENT DETAILS Name: Natalie Brown Age: 56 y.o. Sex: female Date of Birth: 06/09/1965 Admit Date: 12/24/2021 Admitting Physician Dewayne Shorter Levora Dredge, MD [id number], [name], NP  Brief Summary: Patient is a 56 y.o.  female with history of bipolar disorder-who was hospitalized at [facility] received a Ativan challenge for possible catatonia-with minimal improvement-due to persistent altered mental status/tachycardia-patient was admitted to [facility] service for further evaluation and treatment.  No significant finding regarding her tachycardia work-up, 2D echo within normal limit, no significant electrolyte abnormalities.  Evaluated by psychiatry-and unfortunately-patient did not meet criteria to go back to [facility].  While awaiting appropriate disposition-small course complicated by acute COVID-19 infection.  Significant events: 9/4>> transferred to [facility]-from [facility]-Altered-no improvement after Ativan challenge. 9/22>> febrile 9/23>> COVID-19 positive  Significant studies: 9/04>> vitamin B12: 290 9/04>> CXR: No obvious pneumonia 9/04>> MRI brain: No acute intracranial abnormality.  Advanced cerebral/cerebellar atrophy 9/04-9/5>> LTM EEG: No seizures. 9/05>> TSH: 1.3 9/5-9/6>> LTM EEG: Negative. 9/06>> CTA chest: No PE-no PNA. 9/07>> RPR: Nonreactive 9/10>> bilateral lower extremity Doppler: No DVT 9/19>> Echo: EF 60-65%. 9/23>> CXR: No PNA 9/25>> CT head: No acute intracranial process  Significant microbiology data: 9/23>> COVID PCR positive 9/23>> blood culture: Negative  Procedures: None   Consults: Neurology, psychiatry  Subjective: Patient in bed appears to be in no distress trying to eat breakfast, denies any headache or chest pain.  Objective: Vitals: Blood pressure 106/70, pulse (!) 104, temperature 98.4 F (36.9 C), temperature source Axillary, resp. rate 16, height 5\' 6"  (1.676 m), weight 72 kg, SpO2 95 %.    Exam:  Awake but  pleasantly confused, CTAB, soft abdomen.  Assessment/Plan:  Acute encephalopathy, Hyperactive delirium, Dementia Confused, Unchanged over the past several days/weeks Thought to have undiagnosed dementia with significant cerebral/cerebellar atrophy on imaging studies.  Significant atrophy on neuroimaging-chronic finding-neurology suspects patient has progressive dementia.   TSH/RPR stable B12 being supplemented.  Outpatient neurology follow-up No evidence of seizures-no further recommendations from neurology/psychiatry.   Bipolar disorder:  Appreciate psych input-continue Haldol .Per psych requires SNF/memory care.  Elevated D-dimer  - CTA chest/lower extremity Dopplers negative for VTE.  COVID-19 infection:  Now asymptomatic.  Resolved clinically.  AKI on CKD stage IIIa.  Baseline creatinine around 1.3, slightly dehydrated from decreased oral intake, resolved after IVF on 04/01/22.    Vitamin B12 deficiency:  Continue supplementation.  Sinus tachycardia  Resolved-unclear etiology but thought to be due to dehydration. Continue propranolol  Tobacco abuse: Continue transdermal nicotine  BMI: Estimated body mass index is 25.62 kg/m as calculated from the following:   Height as of this encounter: 5\' 6"  (1.676 m).   Weight as of this encounter: 72 kg.   Code status:   Code Status: Full Code   DVT Prophylaxis: enoxaparin (LOVENOX) injection 40 mg Start: 12/26/21 1200    Family Communication: None at bedside  Disposition Plan: Status is: Inpt Planned Discharge Destination: Memory care/SNF-awaiting bed.  Remains medically stable for transfer.  Diet: Diet Order             Diet regular Room service appropriate? No; Fluid consistency: Thin  Diet effective now                    MEDICATIONS: Scheduled Meds:  (feeding supplement) PROSource Plus  30 mL Oral BID  BM   vitamin B-12  1,000 mcg Oral Daily   docusate sodium  200 mg Oral BID   enoxaparin (LOVENOX)  injection  40 mg Subcutaneous Q24H   feeding supplement (KATE FARMS STANDARD 1.4)  325 mL Oral BID BM   haloperidol  1 mg Oral BID   hydrocerin   Topical BID   polyethylene glycol  17 g Oral BID   propranolol  10 mg Oral BID   scopolamine  1 patch Transdermal Q72H   Continuous Infusions:   PRN Meds:.acetaminophen, alum & mag hydroxide-simeth, fluticasone, hydrOXYzine, LORazepam, magnesium hydroxide, metoprolol tartrate, OLANZapine zydis **AND** ziprasidone, ondansetron, mouth rinse   I have personally reviewed following labs and imaging studies  LABORATORY DATA:  Recent Labs  Lab 04/01/22 0016  WBC 5.8  HGB 12.9  HCT 38.8  PLT 296  MCV 86.6  MCH 28.8  MCHC 33.2  RDW 12.8    Recent Labs  Lab 04/01/22 0016 04/01/22 0907 04/02/22 0344  NA 138  --  140  K 3.7  --  4.3  CL 104  --  108  CO2 23  --  23  GLUCOSE 118*  --  89  BUN 16  --  8  CREATININE 1.42*  --  1.11*  CALCIUM 8.6*  --  8.8*  AST 20  --   --   ALT 15  --   --   ALKPHOS 57  --   --   BILITOT 0.2*  --   --   ALBUMIN 3.4*  --   --   MG 1.9  --   --   TSH  --  0.887  --    Lab Results  Component Value Date   TSH 0.887 04/01/2022    RADIOLOGY STUDIES/RESULTS: No results found.   LOS: 97 days   Signature  Susa Raring M.D on 04/02/2022 at 8:49 AM   -  To page go to [email or web address]

## 2022-04-02 NOTE — Progress Notes (Signed)
Mobility Specialist Progress Note:   04/02/22 1001  Mobility  Activity Ambulated with assistance in hallway  Level of Assistance Contact guard assist, steadying assist  Assistive Device Other (Comment) (HHA)  Distance Ambulated (ft) 500 ft  Activity Response Tolerated fair  Mobility Referral Yes  $Mobility charge 1 Mobility   Pt was asleep upon arrival, agreeable to ambulate. HHA required for steadying. Not verbal during session. Left pt in room with all needs met.   Royetta Crochet Mobility Specialist Please contact via Solicitor or  Rehab office at [phone]

## 2022-04-03 NOTE — Progress Notes (Signed)
Pt received from 5W at this time. Talkative with staff with delayed rsponse.

## 2022-04-03 NOTE — Plan of Care (Signed)
  Problem: Clinical Measurements: Goal: Will remain free from infection Outcome: Progressing Goal: Respiratory complications will improve Outcome: Progressing   Problem: Nutrition: Goal: Adequate nutrition will be maintained Outcome: Progressing   

## 2022-04-03 NOTE — Progress Notes (Signed)
PROGRESS NOTE        PATIENT DETAILS Name: Natalie Brown Age: 56 y.o. Sex: female Date of Birth: 1965/09/20 Admit Date: 12/24/2021 Admitting Physician Dewayne Shorter Levora Dredge, MD [id number], [name], NP  Brief Summary:  Patient is a 56 y.o.  female with history of bipolar disorder-who was hospitalized at [facility] received a Ativan challenge for possible catatonia-with minimal improvement-due to persistent altered mental status/tachycardia-patient was admitted to [facility] service for further evaluation and treatment.  No significant finding regarding her tachycardia work-up, 2D echo within normal limit, no significant electrolyte abnormalities.  Evaluated by psychiatry-and unfortunately-patient did not meet criteria to go back to [facility].  While awaiting appropriate disposition-small course complicated by acute COVID-19 infection.  Significant events: 9/4>> transferred to [facility]-from [facility]-Altered-no improvement after Ativan challenge. 9/22>> febrile 9/23>> COVID-19 positive  Significant studies: 9/04>> vitamin B12: 290 9/04>> CXR: No obvious pneumonia 9/04>> MRI brain: No acute intracranial abnormality.  Advanced cerebral/cerebellar atrophy 9/04-9/5>> LTM EEG: No seizures. 9/05>> TSH: 1.3 9/5-9/6>> LTM EEG: Negative. 9/06>> CTA chest: No PE-no PNA. 9/07>> RPR: Nonreactive 9/10>> bilateral lower extremity Doppler: No DVT 9/19>> Echo: EF 60-65%. 9/23>> CXR: No PNA 9/25>> CT head: No acute intracranial process  Significant microbiology data: 9/23>> COVID PCR positive 9/23>> blood culture: Negative  Procedures: None   Consults: Neurology, psychiatry  Subjective: No significant events as discussed with staff, she denies any chest pain or shortness of breath  Objective: Vitals: Blood pressure (!) 127/94, pulse 79, temperature 97.8 F (36.6 C), temperature source Axillary, resp. rate 19, height 5\' 6"  (1.676 m), weight 72 kg, SpO2 97 %.    Exam:  [name] awake, no  apparent distress, good air entry posteriorly, pleasantly confused, slow to respond, significantly impaired cognition and insight  Assessment/Plan:  Acute encephalopathy, Hyperactive delirium, Dementia Confused, Unchanged over the past several days/weeks Thought to have undiagnosed dementia with significant cerebral/cerebellar atrophy on imaging studies.  Significant atrophy on neuroimaging-chronic finding-neurology suspects patient has progressive dementia.   TSH/RPR stable B12 being supplemented.  Outpatient neurology follow-up No evidence of seizures-no further recommendations from neurology/psychiatry.   Bipolar disorder:  Appreciate psych input-continue Haldol .Per psych requires SNF/memory care.  Elevated D-dimer  - CTA chest/lower extremity Dopplers negative for VTE.  COVID-19 infection:  Now asymptomatic.  Resolved clinically.  AKI on CKD stage IIIa.  Baseline creatinine around 1.3, slightly dehydrated from decreased oral intake, resolved after IVF on 04/01/22.    Vitamin B12 deficiency:  Continue supplementation.  Sinus tachycardia  Resolved-unclear etiology but thought to be due to dehydration. Continue propranolol  Tobacco abuse: Continue transdermal nicotine  BMI: Estimated body mass index is 25.62 kg/m as calculated from the following:   Height as of this encounter: 5\' 6"  (1.676 m).   Weight as of this encounter: 72 kg.   Code status:   Code Status: Full Code   DVT Prophylaxis: enoxaparin (LOVENOX) injection 40 mg Start: 12/26/21 1200    Family Communication: None at bedside  Disposition Plan: Status is: Inpt Planned Discharge Destination: Memory care/SNF-awaiting bed.  Remains medically stable for transfer.  Diet: Diet Order             Diet regular Room service appropriate? No; Fluid consistency: Thin  Diet effective now                    MEDICATIONS: Scheduled  Meds:  (feeding supplement) PROSource Plus  30 mL Oral BID BM   vitamin B-12   1,000 mcg Oral Daily   docusate sodium  200 mg Oral BID   enoxaparin (LOVENOX) injection  40 mg Subcutaneous Q24H   feeding supplement (KATE FARMS STANDARD 1.4)  325 mL Oral BID BM   haloperidol  1 mg Oral BID   hydrocerin   Topical BID   polyethylene glycol  17 g Oral BID   propranolol  10 mg Oral BID   scopolamine  1 patch Transdermal Q72H   Continuous Infusions:   PRN Meds:.acetaminophen, alum & mag hydroxide-simeth, fluticasone, hydrOXYzine, LORazepam, magnesium hydroxide, metoprolol tartrate, OLANZapine zydis **AND** ziprasidone, ondansetron, mouth rinse   I have personally reviewed following labs and imaging studies  LABORATORY DATA:  Recent Labs  Lab 04/01/22 0016  WBC 5.8  HGB 12.9  HCT 38.8  PLT 296  MCV 86.6  MCH 28.8  MCHC 33.2  RDW 12.8    Recent Labs  Lab 04/01/22 0016 04/01/22 0907 04/02/22 0344  NA 138  --  140  K 3.7  --  4.3  CL 104  --  108  CO2 23  --  23  GLUCOSE 118*  --  89  BUN 16  --  8  CREATININE 1.42*  --  1.11*  CALCIUM 8.6*  --  8.8*  AST 20  --   --   ALT 15  --   --   ALKPHOS 57  --   --   BILITOT 0.2*  --   --   ALBUMIN 3.4*  --   --   MG 1.9  --   --   TSH  --  0.887  --    Lab Results  Component Value Date   TSH 0.887 04/01/2022    RADIOLOGY STUDIES/RESULTS: No results found.   LOS: 98 days   Signature  Huey Bienenstock M.D on 04/03/2022 at 2:38 PM   -  To page go to [email or web address]

## 2022-04-03 NOTE — Progress Notes (Signed)
Mobility Specialist Progress Note:   04/03/22 0945  Mobility  Activity Ambulated with assistance in hallway  Level of Assistance Contact guard assist, steadying assist  Assistive Device Other (Comment) (HHA)  Distance Ambulated (ft) 500 ft  Activity Response Tolerated well  Mobility Referral Yes  $Mobility charge 1 Mobility   Pt received in bed and agreeable. Asymptomatic throughout ambulation. Pt left in bed with all needs met and call bell in reach.     Mobility Specialist Please contact via SecureChat or  Rehab office at [phone]  

## 2022-04-03 NOTE — Plan of Care (Signed)
  Problem: Education: Goal: Knowledge of General Education information will improve Description: Including pain rating scale, medication(s)/side effects and non-pharmacologic comfort measures Outcome: Not Progressing   Problem: Health Behavior/Discharge Planning: Goal: Ability to manage health-related needs will improve Outcome: Not Progressing   

## 2022-04-04 NOTE — Progress Notes (Signed)
PROGRESS NOTE        PATIENT DETAILS Name: Natalie Brown Age: 56 y.o. Sex: female Date of Birth: 06-12-1965 Admit Date: 12/24/2021 Admitting Physician Dewayne Shorter Levora Dredge, MD [id number], [name], NP  Brief Summary:  Patient is a 56 y.o.  female with history of bipolar disorder-who was hospitalized at [facility] received a Ativan challenge for possible catatonia-with minimal improvement-due to persistent altered mental status/tachycardia-patient was admitted to [facility] service for further evaluation and treatment.  No significant finding regarding her tachycardia work-up, 2D echo within normal limit, no significant electrolyte abnormalities.  Evaluated by psychiatry-and unfortunately-patient did not meet criteria to go back to [facility].  While awaiting appropriate disposition-small course complicated by acute COVID-19 infection.  Significant events: 9/4>> transferred to [facility]-from [facility]-Altered-no improvement after Ativan challenge. 9/22>> febrile 9/23>> COVID-19 positive  Significant studies: 9/04>> vitamin B12: 290 9/04>> CXR: No obvious pneumonia 9/04>> MRI brain: No acute intracranial abnormality.  Advanced cerebral/cerebellar atrophy 9/04-9/5>> LTM EEG: No seizures. 9/05>> TSH: 1.3 9/5-9/6>> LTM EEG: Negative. 9/06>> CTA chest: No PE-no PNA. 9/07>> RPR: Nonreactive 9/10>> bilateral lower extremity Doppler: No DVT 9/19>> Echo: EF 60-65%. 9/23>> CXR: No PNA 9/25>> CT head: No acute intracranial process  Significant microbiology data: 9/23>> COVID PCR positive 9/23>> blood culture: Negative  Procedures: None   Consults: Neurology, psychiatry  Subjective: No significant events as discussed with staff, she denies any complaints today  Objective: Vitals: Blood pressure 106/71, pulse 95, temperature 98.5 F (36.9 C), temperature source Oral, resp. rate 17, height 5\' 6"  (1.676 m), weight 72 kg, SpO2 100 %.    Exam:  Sitting at her bed, eating breakfast,  lungs clear to auscultation,speech is slow, she is with impaired judgment and insight  Assessment/Plan:  Acute encephalopathy, Hyperactive delirium, Dementia Confused, Unchanged over the past several days/weeks Thought to have undiagnosed dementia with significant cerebral/cerebellar atrophy on imaging studies.  Significant atrophy on neuroimaging-chronic finding-neurology suspects patient has progressive dementia.   TSH/RPR stable B12 being supplemented.  Outpatient neurology follow-up No evidence of seizures-no further recommendations from neurology/psychiatry.   Bipolar disorder:  Appreciate psych input-continue Haldol .Per psych requires SNF/memory care.  Elevated D-dimer  - CTA chest/lower extremity Dopplers negative for VTE.  COVID-19 infection:  Now asymptomatic.  Resolved clinically.  AKI on CKD stage IIIa.  Baseline creatinine around 1.3, slightly dehydrated from decreased oral intake, resolved after IVF on 04/01/22.    Vitamin B12 deficiency:  Continue supplementation.  Sinus tachycardia  Resolved-unclear etiology but thought to be due to dehydration. Continue propranolol  Tobacco abuse: Continue transdermal nicotine  BMI: Estimated body mass index is 25.62 kg/m as calculated from the following:   Height as of this encounter: 5\' 6"  (1.676 m).   Weight as of this encounter: 72 kg.   Code status:   Code Status: Full Code   DVT Prophylaxis: enoxaparin (LOVENOX) injection 40 mg Start: 12/26/21 1200    Family Communication: None at bedside  Disposition Plan: Status is: Inpt Planned Discharge Destination: Memory care/SNF-awaiting bed.  Remains medically stable for transfer.  Diet: Diet Order             Diet regular Room service appropriate? No; Fluid consistency: Thin  Diet effective now                    MEDICATIONS: Scheduled Meds:  (feeding supplement) PROSource Plus  30 mL Oral BID BM   vitamin B-12  1,000 mcg Oral Daily   docusate sodium   200 mg Oral BID   enoxaparin (LOVENOX) injection  40 mg Subcutaneous Q24H   feeding supplement (KATE FARMS STANDARD 1.4)  325 mL Oral BID BM   haloperidol  1 mg Oral BID   hydrocerin   Topical BID   polyethylene glycol  17 g Oral BID   propranolol  10 mg Oral BID   scopolamine  1 patch Transdermal Q72H   Continuous Infusions:   PRN Meds:.acetaminophen, alum & mag hydroxide-simeth, fluticasone, hydrOXYzine, LORazepam, magnesium hydroxide, metoprolol tartrate, OLANZapine zydis **AND** [COMPLETED] ziprasidone, ondansetron, mouth rinse   I have personally reviewed following labs and imaging studies  LABORATORY DATA:  Recent Labs  Lab 04/01/22 0016  WBC 5.8  HGB 12.9  HCT 38.8  PLT 296  MCV 86.6  MCH 28.8  MCHC 33.2  RDW 12.8    Recent Labs  Lab 04/01/22 0016 04/01/22 0907 04/02/22 0344  NA 138  --  140  K 3.7  --  4.3  CL 104  --  108  CO2 23  --  23  GLUCOSE 118*  --  89  BUN 16  --  8  CREATININE 1.42*  --  1.11*  CALCIUM 8.6*  --  8.8*  AST 20  --   --   ALT 15  --   --   ALKPHOS 57  --   --   BILITOT 0.2*  --   --   ALBUMIN 3.4*  --   --   MG 1.9  --   --   TSH  --  0.887  --    Lab Results  Component Value Date   TSH 0.887 04/01/2022    RADIOLOGY STUDIES/RESULTS: No results found.   LOS: 99 days   Signature  Huey Bienenstock M.D on 04/04/2022 at 1:24 PM   -  To page go to [email or web address]

## 2022-04-04 NOTE — Progress Notes (Signed)
Patient ID: KEEGAN BENSCH, female   DOB: 1966-02-21, 56 y.o.   MRN: [id number]  Patient walking in hallway and scratching and pushing staff and visitors. All PRNs for agitation have been exhausted. No sitter or tele sitter available per [facility] and staffing. Patient refuses bed alarms and staying in the bed. Redirection unsuccessful. Mittens unsuccessful. MD does not want patient restrained. SWAT, management, charge nurse, and unit nurses unable to help watch patient in order for other patients to receive care and medications. Patient is wandering at this time. Will continue to monitor.  Lidia Collum, RN

## 2022-04-04 NOTE — Progress Notes (Addendum)
Patient is confused, restless and unable to follow directions.She has been wandering around the unit since the beginning of the shift. Very hard to redirect. Alert to person only, very distracted. No distress noted. Respirations even and unlabored on RA.  Called [facility] and gave report to [facility] - receiving RN. Security called for assistance to transport patient to [facility]. She was transferred with Charge RN and another Charity fundraiser. All personal belongings given back to patient upon transfer.

## 2022-04-04 NOTE — Progress Notes (Signed)
Pt very restless and keeps wandering purposelessly around the nurses' station. Unable to redirect or control the pt even with current ordered meds. Contacted MD for restraints or sitter but MD not agreeable to any form of restraints. MD ordered pt to be transferred back to the unit where she came from as she was calm and maybe comfortable with her environment there. Waiting for bed availability on [facility]. Primary nurse notified of these plans.

## 2022-04-04 NOTE — Progress Notes (Signed)
Still waiting for bed availability on [facility]

## 2022-04-05 NOTE — Plan of Care (Signed)

## 2022-04-05 NOTE — Progress Notes (Signed)
PROGRESS NOTE        PATIENT DETAILS Name: Natalie Brown Age: 56 y.o. Sex: female Date of Birth: 09/25/1965 Admit Date: 12/24/2021 Admitting Physician Dewayne Shorter Levora Dredge, MD DSK:[id number], [name], NP  Brief Summary:  Patient is a 56 y.o.  female with history of bipolar disorder-who was hospitalized at [facility] received a Ativan challenge for possible catatonia-with minimal improvement-due to persistent altered mental status/tachycardia-patient was admitted to [facility] service for further evaluation and treatment.  No significant finding regarding her tachycardia work-up, 2D echo within normal limit, no significant electrolyte abnormalities.  Evaluated by psychiatry-and unfortunately-patient did not meet criteria to go back to [facility].  While awaiting appropriate disposition-small course complicated by acute COVID-19 infection.  Significant events: 9/4>> transferred to [facility]-from [facility]-Altered-no improvement after Ativan challenge. 9/22>> febrile 9/23>> COVID-19 positive  Significant studies: 9/04>> vitamin B12: 290 9/04>> CXR: No obvious pneumonia 9/04>> MRI brain: No acute intracranial abnormality.  Advanced cerebral/cerebellar atrophy 9/04-9/5>> LTM EEG: No seizures. 9/05>> TSH: 1.3 9/5-9/6>> LTM EEG: Negative. 9/06>> CTA chest: No PE-no PNA. 9/07>> RPR: Nonreactive 9/10>> bilateral lower extremity Doppler: No DVT 9/19>> Echo: EF 60-65%. 9/23>> CXR: No PNA 9/25>> CT head: No acute intracranial process  Significant microbiology data: 9/23>> COVID PCR positive 9/23>> blood culture: Negative  Procedures: None   Consults: Neurology, psychiatry  Subjective: No significant events as discussed with staff, he denies any complaints today  Objective: Vitals: Blood pressure 116/71, pulse 66, temperature 98.6 F (37 C), temperature source Oral, resp. rate 16, height 5\' 6"  (1.676 m), weight 72 kg, SpO2 98 %.    Exam:  Sitting in bed, good air entry bilaterally,  awake, regular rate and rhythm.  Assessment/Plan:  Acute encephalopathy, Hyperactive delirium, Dementia Confused, Unchanged over the past several days/weeks Thought to have undiagnosed dementia with significant cerebral/cerebellar atrophy on imaging studies.  Significant atrophy on neuroimaging-chronic finding-neurology suspects patient has progressive dementia.   TSH/RPR stable B12 being supplemented.  Outpatient neurology follow-up No evidence of seizures-no further recommendations from neurology/psychiatry.   Bipolar disorder:  Appreciate psych input-continue Haldol .Per psych requires SNF/memory care.  Elevated D-dimer  - CTA chest/lower extremity Dopplers negative for VTE.  COVID-19 infection:  Now asymptomatic.  Resolved clinically.  AKI on CKD stage IIIa.  Baseline creatinine around 1.3, slightly dehydrated from decreased oral intake, resolved after IVF on 04/01/22.    Vitamin B12 deficiency:  Continue supplementation.  Sinus tachycardia  Resolved-unclear etiology but thought to be due to dehydration. Continue propranolol  Tobacco abuse: Continue transdermal nicotine  BMI: Estimated body mass index is 25.62 kg/m as calculated from the following:   Height as of this encounter: 5\' 6"  (1.676 m).   Weight as of this encounter: 72 kg.   Code status:   Code Status: Full Code   DVT Prophylaxis: enoxaparin (LOVENOX) injection 40 mg Start: 12/26/21 1200    Family Communication: None at bedside  Disposition Plan: Status is: Inpt Planned Discharge Destination: Memory care/SNF-awaiting bed.  Remains medically stable for transfer.  Diet: Diet Order             Diet regular Room service appropriate? No; Fluid consistency: Thin  Diet effective now                    MEDICATIONS: Scheduled Meds:  (feeding supplement) PROSource Plus  30 mL Oral BID BM  vitamin B-12  1,000 mcg Oral Daily   docusate sodium  200 mg Oral BID   enoxaparin (LOVENOX) injection  40  mg Subcutaneous Q24H   feeding supplement (KATE FARMS STANDARD 1.4)  325 mL Oral BID BM   haloperidol  1 mg Oral BID   hydrocerin   Topical BID   polyethylene glycol  17 g Oral BID   propranolol  10 mg Oral BID   scopolamine  1 patch Transdermal Q72H   Continuous Infusions:   PRN Meds:.acetaminophen, alum & mag hydroxide-simeth, fluticasone, hydrOXYzine, LORazepam, magnesium hydroxide, metoprolol tartrate, OLANZapine zydis **AND** [COMPLETED] ziprasidone, ondansetron, mouth rinse   I have personally reviewed following labs and imaging studies  LABORATORY DATA:  Recent Labs  Lab 04/01/22 0016  WBC 5.8  HGB 12.9  HCT 38.8  PLT 296  MCV 86.6  MCH 28.8  MCHC 33.2  RDW 12.8    Recent Labs  Lab 04/01/22 0016 04/01/22 0907 04/02/22 0344  NA 138  --  140  K 3.7  --  4.3  CL 104  --  108  CO2 23  --  23  GLUCOSE 118*  --  89  BUN 16  --  8  CREATININE 1.42*  --  1.11*  CALCIUM 8.6*  --  8.8*  AST 20  --   --   ALT 15  --   --   ALKPHOS 57  --   --   BILITOT 0.2*  --   --   ALBUMIN 3.4*  --   --   MG 1.9  --   --   TSH  --  0.887  --    Lab Results  Component Value Date   TSH 0.887 04/01/2022    RADIOLOGY STUDIES/RESULTS: No results found.   LOS: 100 days   Signature  Phillips Climes M.D on 04/05/2022 at 3:00 PM   -  To page go to [email or web address]

## 2022-04-06 NOTE — Plan of Care (Signed)

## 2022-04-06 NOTE — Progress Notes (Signed)
Mobility Specialist Progress Note:   04/06/22 1030  Mobility  Activity Ambulated with assistance in hallway  Level of Assistance Contact guard assist, steadying assist  Assistive Device Other (Comment) (HHA)  Distance Ambulated (ft) 500 ft  Activity Response Tolerated well  Mobility Referral Yes  $Mobility charge 1 Mobility   Pt received sitting EOB and agreeable. Asymptomatic throughout ambulation. Pt left in bed with all needs met and call bell in reach.   Andrey Campanile Mobility Specialist Please contact via SecureChat or  Rehab office at [phone]

## 2022-04-06 NOTE — Plan of Care (Signed)

## 2022-04-06 NOTE — Progress Notes (Signed)
PROGRESS NOTE        PATIENT DETAILS Name: Natalie Brown Age: 56 y.o. Sex: female Date of Birth: Jun 14, 1965 Admit Date: 12/24/2021 Admitting Physician Dewayne Shorter Levora Dredge, MD WUJ:[id number], [name], NP  Brief Summary:  Patient is a 56 y.o.  female with history of bipolar disorder-who was hospitalized at [facility] received a Ativan challenge for possible catatonia-with minimal improvement-due to persistent altered mental status/tachycardia-patient was admitted to [facility] service for further evaluation and treatment.  No significant finding regarding her tachycardia work-up, 2D echo within normal limit, no significant electrolyte abnormalities.  Evaluated by psychiatry-and unfortunately-patient did not meet criteria to go back to [facility].  While awaiting appropriate disposition-small course complicated by acute COVID-19 infection.  Significant events: 9/4>> transferred to [facility]-from [facility]-Altered-no improvement after Ativan challenge. 9/22>> febrile 9/23>> COVID-19 positive  Significant studies: 9/04>> vitamin B12: 290 9/04>> CXR: No obvious pneumonia 9/04>> MRI brain: No acute intracranial abnormality.  Advanced cerebral/cerebellar atrophy 9/04-9/5>> LTM EEG: No seizures. 9/05>> TSH: 1.3 9/5-9/6>> LTM EEG: Negative. 9/06>> CTA chest: No PE-no PNA. 9/07>> RPR: Nonreactive 9/10>> bilateral lower extremity Doppler: No DVT 9/19>> Echo: EF 60-65%. 9/23>> CXR: No PNA 9/25>> CT head: No acute intracranial process  Significant microbiology data: 9/23>> COVID PCR positive 9/23>> blood culture: Negative  Procedures: None   Consults: Neurology, psychiatry  Subjective: No significant events overnight as discussed with staff, she denies any complaints today.  Objective: Vitals: Blood pressure 116/71, pulse 66, temperature 98.6 F (37 C), temperature source Oral, resp. rate 16, height 5\' 6"  (1.676 m), weight 72 kg, SpO2 98 %.    Exam:  Sitting in bed, eating breakfast,  no apparent distress, good air entry posteriorly  Assessment/Plan:  Acute encephalopathy, Hyperactive delirium, Dementia Confused, Unchanged over the past several days/weeks Thought to have undiagnosed dementia with significant cerebral/cerebellar atrophy on imaging studies.  Significant atrophy on neuroimaging-chronic finding-neurology suspects patient has progressive dementia.   TSH/RPR stable B12 being supplemented.  Outpatient neurology follow-up No evidence of seizures-no further recommendations from neurology/psychiatry.   Bipolar disorder:  Appreciate psych input-continue Haldol .Per psych requires SNF/memory care.  Elevated D-dimer  - CTA chest/lower extremity Dopplers negative for VTE.  COVID-19 infection:  Now asymptomatic.  Resolved clinically.  AKI on CKD stage IIIa.  Baseline creatinine around 1.3, slightly dehydrated from decreased oral intake, resolved after IVF on 04/01/22.    Vitamin B12 deficiency:  Continue supplementation.  Sinus tachycardia  Resolved-unclear etiology but thought to be due to dehydration. Continue propranolol  Tobacco abuse: Continue transdermal nicotine  BMI: Estimated body mass index is 25.62 kg/m as calculated from the following:   Height as of this encounter: 5\' 6"  (1.676 m).   Weight as of this encounter: 72 kg.   Code status:   Code Status: Full Code   DVT Prophylaxis: enoxaparin (LOVENOX) injection 40 mg Start: 12/26/21 1200    Family Communication: None at bedside  Disposition Plan: Status is: Inpt Planned Discharge Destination: Memory care/SNF-awaiting bed.  Remains medically stable for transfer.  Diet: Diet Order             Diet regular Room service appropriate? No; Fluid consistency: Thin  Diet effective now                    MEDICATIONS: Scheduled Meds:  (feeding supplement) PROSource Plus  30 mL Oral BID BM  vitamin B-12  1,000 mcg Oral Daily   docusate sodium  200 mg Oral BID   enoxaparin  (LOVENOX) injection  40 mg Subcutaneous Q24H   feeding supplement (KATE FARMS STANDARD 1.4)  325 mL Oral BID BM   haloperidol  1 mg Oral BID   hydrocerin   Topical BID   polyethylene glycol  17 g Oral BID   propranolol  10 mg Oral BID   scopolamine  1 patch Transdermal Q72H   Continuous Infusions:   PRN Meds:.acetaminophen, alum & mag hydroxide-simeth, fluticasone, hydrOXYzine, LORazepam, magnesium hydroxide, metoprolol tartrate, OLANZapine zydis **AND** [COMPLETED] ziprasidone, ondansetron, mouth rinse   I have personally reviewed following labs and imaging studies  LABORATORY DATA:  Recent Labs  Lab 04/01/22 0016  WBC 5.8  HGB 12.9  HCT 38.8  PLT 296  MCV 86.6  MCH 28.8  MCHC 33.2  RDW 12.8    Recent Labs  Lab 04/01/22 0016 04/01/22 0907 04/02/22 0344  NA 138  --  140  K 3.7  --  4.3  CL 104  --  108  CO2 23  --  23  GLUCOSE 118*  --  89  BUN 16  --  8  CREATININE 1.42*  --  1.11*  CALCIUM 8.6*  --  8.8*  AST 20  --   --   ALT 15  --   --   ALKPHOS 57  --   --   BILITOT 0.2*  --   --   ALBUMIN 3.4*  --   --   MG 1.9  --   --   TSH  --  0.887  --    Lab Results  Component Value Date   TSH 0.887 04/01/2022    RADIOLOGY STUDIES/RESULTS: No results found.   LOS: 101 days   Signature  Huey Bienenstock M.D on 04/06/2022 at 12:51 PM   -  To page go to [email or web address]

## 2022-04-07 NOTE — Progress Notes (Signed)
PROGRESS NOTE        PATIENT DETAILS Name: Natalie Brown Age: 56 y.o. Sex: female Date of Birth: [birth year] Admit Date: 12/24/2021 Admitting Physician Dewayne Shorter Levora Dredge, MD TIR:[id number], [name], NP  Brief Summary:  Patient is a 56 y.o.  female with history of bipolar disorder-who was hospitalized at [facility] received a Ativan challenge for possible catatonia-with minimal improvement-due to persistent altered mental status/tachycardia-patient was admitted to [facility] service for further evaluation and treatment.  No significant finding regarding her tachycardia work-up, 2D echo within normal limit, no significant electrolyte abnormalities.  Evaluated by psychiatry-and unfortunately-patient did not meet criteria to go back to [facility].  While awaiting appropriate disposition-small course complicated by acute COVID-19 infection.  Significant events: 9/4>> transferred to [facility]-from [facility]-Altered-no improvement after Ativan challenge. 9/22>> febrile 9/23>> COVID-19 positive  Significant studies: 9/04>> vitamin B12: 290 9/04>> CXR: No obvious pneumonia 9/04>> MRI brain: No acute intracranial abnormality.  Advanced cerebral/cerebellar atrophy 9/04-9/5>> LTM EEG: No seizures. 9/05>> TSH: 1.3 9/5-9/6>> LTM EEG: Negative. 9/06>> CTA chest: No PE-no PNA. 9/07>> RPR: Nonreactive 9/10>> bilateral lower extremity Doppler: No DVT 9/19>> Echo: EF 60-65%. 9/23>> CXR: No PNA 9/25>> CT head: No acute intracranial process  Significant microbiology data: 9/23>> COVID PCR positive 9/23>> blood culture: Negative  Procedures: None   Consults: Neurology, psychiatry  Subjective: No significant events overnight as discussed with staff, she is sleeping comfortably Objective: Vitals: Blood pressure 108/64, pulse (!) 59, temperature 98.6 F (37 C), temperature source Oral, resp. rate 18, height 5\' 6"  (1.676 m), weight 72 kg, SpO2 98 %.    Exam:  Sleeping comfortably, no apparent  distress, good air entry posteriorly.     Assessment/Plan:  Acute encephalopathy, Hyperactive delirium, Dementia Confused, Unchanged over the past several days/weeks Thought to have undiagnosed dementia with significant cerebral/cerebellar atrophy on imaging studies.  Significant atrophy on neuroimaging-chronic finding-neurology suspects patient has progressive dementia.   TSH/RPR stable B12 being supplemented.  Outpatient neurology follow-up No evidence of seizures-no further recommendations from neurology/psychiatry.   Bipolar disorder:  Appreciate psych input-continue Haldol .Per psych requires SNF/memory care.  Elevated D-dimer  - CTA chest/lower extremity Dopplers negative for VTE.  COVID-19 infection:  Now asymptomatic.  Resolved clinically.  AKI on CKD stage IIIa.  Baseline creatinine around 1.3, slightly dehydrated from decreased oral intake, resolved after IVF on 04/01/22.    Vitamin B12 deficiency:  Continue supplementation.  Sinus tachycardia  Resolved-unclear etiology but thought to be due to dehydration. Continue propranolol  Tobacco abuse: Continue transdermal nicotine  BMI: Estimated body mass index is 25.62 kg/m as calculated from the following:   Height as of this encounter: 5\' 6"  (1.676 m).   Weight as of this encounter: 72 kg.   Code status:   Code Status: Full Code   DVT Prophylaxis: enoxaparin (LOVENOX) injection 40 mg Start: 12/26/21 1200    Family Communication: None at bedside  Disposition Plan: Status is: Inpt Planned Discharge Destination: Memory care/SNF-awaiting bed.  Remains medically stable for transfer.  Diet: Diet Order             Diet regular Room service appropriate? No; Fluid consistency: Thin  Diet effective now                    MEDICATIONS: Scheduled Meds:  (feeding supplement) PROSource Plus  30 mL Oral BID BM  vitamin B-12  1,000 mcg Oral Daily   docusate sodium  200 mg Oral BID   enoxaparin (LOVENOX)  injection  40 mg Subcutaneous Q24H   feeding supplement (KATE FARMS STANDARD 1.4)  325 mL Oral BID BM   haloperidol  1 mg Oral BID   hydrocerin   Topical BID   polyethylene glycol  17 g Oral BID   propranolol  10 mg Oral BID   scopolamine  1 patch Transdermal Q72H   Continuous Infusions:   PRN Meds:.acetaminophen, alum & mag hydroxide-simeth, fluticasone, hydrOXYzine, LORazepam, magnesium hydroxide, metoprolol tartrate, OLANZapine zydis **AND** [COMPLETED] ziprasidone, ondansetron, mouth rinse   I have personally reviewed following labs and imaging studies  LABORATORY DATA:  Recent Labs  Lab 04/01/22 0016  WBC 5.8  HGB 12.9  HCT 38.8  PLT 296  MCV 86.6  MCH 28.8  MCHC 33.2  RDW 12.8    Recent Labs  Lab 04/01/22 0016 04/01/22 0907 04/02/22 0344  NA 138  --  140  K 3.7  --  4.3  CL 104  --  108  CO2 23  --  23  GLUCOSE 118*  --  89  BUN 16  --  8  CREATININE 1.42*  --  1.11*  CALCIUM 8.6*  --  8.8*  AST 20  --   --   ALT 15  --   --   ALKPHOS 57  --   --   BILITOT 0.2*  --   --   ALBUMIN 3.4*  --   --   MG 1.9  --   --   TSH  --  0.887  --    Lab Results  Component Value Date   TSH 0.887 04/01/2022    RADIOLOGY STUDIES/RESULTS: No results found.   LOS: 102 days   Signature  Phillips Climes M.D on 04/07/2022 at 12:31 PM   -  To page go to [email or web address]

## 2022-04-08 LAB — CBC
HCT: 43.8 % (ref 36.0–46.0)
Hemoglobin: 14.5 g/dL (ref 12.0–15.0)
MCH: 29.1 pg (ref 26.0–34.0)
MCHC: 33.1 g/dL (ref 30.0–36.0)
MCV: 88 fL (ref 80.0–100.0)
Platelets: 254 10*3/uL (ref 150–400)
RBC: 4.98 MIL/uL (ref 3.87–5.11)
RDW: 13.2 % (ref 11.5–15.5)
WBC: 6.5 10*3/uL (ref 4.0–10.5)
nRBC: 0 % (ref 0.0–0.2)

## 2022-04-08 LAB — COMPREHENSIVE METABOLIC PANEL
ALT: 18 U/L (ref 0–44)
AST: 29 U/L (ref 15–41)
Albumin: 3.3 g/dL — ABNORMAL LOW (ref 3.5–5.0)
Alkaline Phosphatase: 58 U/L (ref 38–126)
Anion gap: 6 (ref 5–15)
BUN: 14 mg/dL (ref 6–20)
CO2: 27 mmol/L (ref 22–32)
Calcium: 8.8 mg/dL — ABNORMAL LOW (ref 8.9–10.3)
Chloride: 107 mmol/L (ref 98–111)
Creatinine, Ser: 1.39 mg/dL — ABNORMAL HIGH (ref 0.44–1.00)
GFR, Estimated: 45 mL/min — ABNORMAL LOW (ref >60)
Glucose, Bld: 89 mg/dL (ref 70–99)
Potassium: 4.4 mmol/L (ref 3.5–5.1)
Sodium: 140 mmol/L (ref 135–145)
Total Bilirubin: 0.3 mg/dL (ref 0.3–1.2)
Total Protein: 6.4 g/dL — ABNORMAL LOW (ref 6.5–8.1)

## 2022-04-08 LAB — MAGNESIUM: Magnesium: 2.1 mg/dL (ref 1.7–2.4)

## 2022-04-08 NOTE — Progress Notes (Signed)
Pt showered, linen changed , belongings bagged up, anf report called to [facility] on [facility], taken by bed by [place] [facility] [place].

## 2022-04-08 NOTE — Plan of Care (Signed)

## 2022-04-08 NOTE — Progress Notes (Signed)
PROGRESS NOTE        PATIENT DETAILS Name: Natalie Brown Age: 56 y.o. Sex: female Date of Birth: Dec 30, 1965 Admit Date: 12/24/2021 Admitting Physician Dewayne Shorter Levora Dredge, MD BWL:[id number], [name], NP  Brief Summary:  Patient is a 56 y.o.  female with history of bipolar disorder-who was hospitalized at [facility] received a Ativan challenge for possible catatonia-with minimal improvement-due to persistent altered mental status/tachycardia-patient was admitted to [facility] service for further evaluation and treatment.  No significant finding regarding her tachycardia work-up, 2D echo within normal limit, no significant electrolyte abnormalities.  Evaluated by psychiatry-and unfortunately-patient did not meet criteria to go back to [facility].  While awaiting appropriate disposition-small course complicated by acute COVID-19 infection.  Significant events: 9/4>> transferred to [facility]-from [facility]-Altered-no improvement after Ativan challenge. 9/22>> febrile 9/23>> COVID-19 positive  Significant studies: 9/04>> vitamin B12: 290 9/04>> CXR: No obvious pneumonia 9/04>> MRI brain: No acute intracranial abnormality.  Advanced cerebral/cerebellar atrophy 9/04-9/5>> LTM EEG: No seizures. 9/05>> TSH: 1.3 9/5-9/6>> LTM EEG: Negative. 9/06>> CTA chest: No PE-no PNA. 9/07>> RPR: Nonreactive 9/10>> bilateral lower extremity Doppler: No DVT 9/19>> Echo: EF 60-65%. 9/23>> CXR: No PNA 9/25>> CT head: No acute intracranial process  Significant microbiology data: 9/23>> COVID PCR positive 9/23>> blood culture: Negative  Procedures: None   Consults: Neurology, psychiatry  Subjective: Significant events overnight, she is unable to provide any complaints today. Objective: Vitals: Blood pressure 102/83, pulse 90, temperature 98.6 F (37 C), temperature source Oral, resp. rate 18, height 5\' 6"  (1.676 m), weight 72 kg, SpO2 99 %.    Exam:  [name] sitting at the edge of the bed, eating  breakfast, lungs clear to auscultation, no apparent distress, no edema.   Assessment/Plan:  Acute encephalopathy, Hyperactive delirium, Dementia Confused, Unchanged over the past several days/weeks Thought to have undiagnosed dementia with significant cerebral/cerebellar atrophy on imaging studies.  Significant atrophy on neuroimaging-chronic finding-neurology suspects patient has progressive dementia.   TSH/RPR stable B12 being supplemented.  Outpatient neurology follow-up No evidence of seizures-no further recommendations from neurology/psychiatry.   Bipolar disorder:  Appreciate psych input-continue Haldol .Per psych requires SNF/memory care.  Elevated D-dimer  - CTA chest/lower extremity Dopplers negative for VTE.  COVID-19 infection:  Now asymptomatic.  Resolved clinically.  AKI on CKD stage IIIa.  Baseline creatinine around 1.3, slightly dehydrated from decreased oral intake, resolved after IVF on 04/01/22.    Vitamin B12 deficiency:  Continue supplementation.  Sinus tachycardia  Resolved-unclear etiology but thought to be due to dehydration. Continue propranolol  Tobacco abuse: Continue transdermal nicotine  BMI: Estimated body mass index is 25.62 kg/m as calculated from the following:   Height as of this encounter: 5\' 6"  (1.676 m).   Weight as of this encounter: 72 kg.   Code status:   Code Status: Full Code   DVT Prophylaxis: enoxaparin (LOVENOX) injection 40 mg Start: 12/26/21 1200    Family Communication: None at bedside  Disposition Plan: Status is: Inpt Planned Discharge Destination: Memory care/SNF-awaiting bed.  Remains medically stable for transfer.  Diet: Diet Order             Diet regular Room service appropriate? No; Fluid consistency: Thin  Diet effective now                    MEDICATIONS: Scheduled Meds:  (feeding supplement) PROSource Plus  30 mL Oral BID BM   vitamin B-12  1,000 mcg Oral Daily   docusate sodium  200 mg Oral  BID   enoxaparin (LOVENOX) injection  40 mg Subcutaneous Q24H   feeding supplement (KATE FARMS STANDARD 1.4)  325 mL Oral BID BM   haloperidol  1 mg Oral BID   hydrocerin   Topical BID   polyethylene glycol  17 g Oral BID   propranolol  10 mg Oral BID   scopolamine  1 patch Transdermal Q72H   Continuous Infusions:   PRN Meds:.acetaminophen, alum & mag hydroxide-simeth, fluticasone, hydrOXYzine, LORazepam, magnesium hydroxide, metoprolol tartrate, OLANZapine zydis **AND** [COMPLETED] ziprasidone, ondansetron, mouth rinse   I have personally reviewed following labs and imaging studies  LABORATORY DATA:  Recent Labs  Lab 04/08/22 0050  WBC 6.5  HGB 14.5  HCT 43.8  PLT 254  MCV 88.0  MCH 29.1  MCHC 33.1  RDW 13.2    Recent Labs  Lab 04/02/22 0344 04/08/22 0050  NA 140 140  K 4.3 4.4  CL 108 107  CO2 23 27  GLUCOSE 89 89  BUN 8 14  CREATININE 1.11* 1.39*  CALCIUM 8.8* 8.8*  AST  --  29  ALT  --  18  ALKPHOS  --  58  BILITOT  --  0.3  ALBUMIN  --  3.3*  MG  --  2.1   Lab Results  Component Value Date   TSH 0.887 04/01/2022    RADIOLOGY STUDIES/RESULTS: No results found.   LOS: 103 days   Signature  Huey Bienenstock M.D on 04/08/2022 at 3:38 PM   -  To page go to [email or web address]

## 2022-04-08 NOTE — Progress Notes (Signed)
Mobility Specialist Progress Note:   04/08/22 1005  Mobility  Activity Ambulated independently in hallway  Level of Assistance Independent  Assistive Device None  Distance Ambulated (ft) 200 ft  Activity Response Tolerated well  Mobility Referral Yes  $Mobility charge 1 Mobility   Pt received ambulating in hallway and agreeable. Asymptomatic throughout. Pt returned to room with all needs met and call bell in reach. RN aware.   Andrey Campanile Mobility Specialist Please contact via SecureChat or  Rehab office at [phone]

## 2022-04-08 NOTE — Progress Notes (Signed)
Mobility Specialist Progress Note:   04/08/22 1145  Mobility  Activity Ambulated independently in hallway  Level of Assistance Independent  Assistive Device None  Distance Ambulated (ft) 200 ft  Activity Response Tolerated well  Mobility Referral Yes  $Mobility charge 1 Mobility   Pt received ambulating in hallway and refusing attempts to get pt to return to room. Asymptomatic throughout. Pt left in room with all needs met and call bell in reach.    Andrey Campanile Mobility Specialist Please contact via SecureChat or  Rehab office at [phone]

## 2022-04-08 NOTE — Progress Notes (Signed)
Mobility Specialist Progress Note:   04/08/22 0935  Mobility  Activity Ambulated with assistance in hallway  Level of Assistance Contact guard assist, steadying assist  Assistive Device Other (Comment) (HHA)  Distance Ambulated (ft) 500 ft  Activity Response Tolerated well  Mobility Referral Yes  $Mobility charge 1 Mobility   Pt received in bed and agreeable. Asymptomatic throughout. Pt left in bed with all needs met and call bell in reach.   Andrey Campanile Mobility Specialist Please contact via SecureChat or  Rehab office at [phone]

## 2022-04-09 NOTE — Progress Notes (Signed)
PROGRESS NOTE        PATIENT DETAILS Name: Natalie Brown Age: 56 y.o. Sex: female Date of Birth: 11-15-1965 Admit Date: 12/24/2021 Admitting Physician Dewayne Shorter Levora Dredge, MD SAY:[id number], [name], NP  Brief Summary:  Patient is a 55 y.o.  female with history of bipolar disorder-who was hospitalized at [facility] received a Ativan challenge for possible catatonia-with minimal improvement-due to persistent altered mental status/tachycardia-patient was admitted to [facility] service for further evaluation and treatment.  No significant finding regarding her tachycardia work-up, 2D echo within normal limit, no significant electrolyte abnormalities.  Evaluated by psychiatry-and unfortunately-patient did not meet criteria to go back to [facility].  While awaiting appropriate disposition-small course complicated by acute COVID-19 infection.  Significant events: 9/4>> transferred to [facility]-from [facility]-Altered-no improvement after Ativan challenge. 9/22>> febrile 9/23>> COVID-19 positive  Significant studies: 9/04>> vitamin B12: 290 9/04>> CXR: No obvious pneumonia 9/04>> MRI brain: No acute intracranial abnormality.  Advanced cerebral/cerebellar atrophy 9/04-9/5>> LTM EEG: No seizures. 9/05>> TSH: 1.3 9/5-9/6>> LTM EEG: Negative. 9/06>> CTA chest: No PE-no PNA. 9/07>> RPR: Nonreactive 9/10>> bilateral lower extremity Doppler: No DVT 9/19>> Echo: EF 60-65%. 9/23>> CXR: No PNA 9/25>> CT head: No acute intracranial process  Significant microbiology data: 9/23>> COVID PCR positive 9/23>> blood culture: Negative  Procedures: None   Consults: Neurology, psychiatry  Subjective: No significant events overnight, she is unable to provide any reliable complaints  Objective: Vitals: Blood pressure (!) 150/88, pulse 83, temperature 98.1 F (36.7 C), temperature source Oral, resp. rate 18, height 5\' 6"  (1.676 m), weight 72 kg, SpO2 100 %.    Exam:  At the edge of the bed, eating  breakfast, she has flat affect, does not answer any questions or follow any commands Good air entry bilaterally, no wheezing Regular rate and rhythm, no rubs or gallops Abdomen soft Extremity with no edema   Assessment/Plan:  Acute encephalopathy, Hyperactive delirium, Dementia Confused, Unchanged over the past several days/weeks Thought to have undiagnosed dementia with significant cerebral/cerebellar atrophy on imaging studies.  Significant atrophy on neuroimaging-chronic finding-neurology suspects patient has progressive dementia.   TSH/RPR stable B12 being supplemented.  Outpatient neurology follow-up No evidence of seizures-no further recommendations from neurology/psychiatry.   Bipolar disorder:  Appreciate psych input-continue Haldol .Per psych requires SNF/memory care.  Elevated D-dimer  - CTA chest/lower extremity Dopplers negative for VTE.  COVID-19 infection:  Now asymptomatic.  Resolved clinically.  AKI on CKD stage IIIa.  Baseline creatinine around 1.3, slightly dehydrated from decreased oral intake, resolved after IVF on 04/01/22.    Vitamin B12 deficiency:  Continue supplementation.  Sinus tachycardia  Resolved-unclear etiology but thought to be due to dehydration. Continue propranolol  Tobacco abuse: Continue transdermal nicotine  BMI: Estimated body mass index is 25.62 kg/m as calculated from the following:   Height as of this encounter: 5\' 6"  (1.676 m).   Weight as of this encounter: 72 kg.   Code status:   Code Status: Full Code   DVT Prophylaxis: enoxaparin (LOVENOX) injection 40 mg Start: 12/26/21 1200    Family Communication: None at bedside  Disposition Plan: Status is: Inpt Planned Discharge Destination: Memory care/SNF-awaiting bed.  Remains medically stable for transfer.  Diet: Diet Order             Diet regular Room service appropriate? No; Fluid consistency: Thin  Diet effective now  MEDICATIONS: Scheduled Meds:  (feeding supplement) PROSource Plus  30 mL Oral BID BM   vitamin B-12  1,000 mcg Oral Daily   docusate sodium  200 mg Oral BID   enoxaparin (LOVENOX) injection  40 mg Subcutaneous Q24H   feeding supplement (KATE FARMS STANDARD 1.4)  325 mL Oral BID BM   haloperidol  1 mg Oral BID   hydrocerin   Topical BID   polyethylene glycol  17 g Oral BID   propranolol  10 mg Oral BID   scopolamine  1 patch Transdermal Q72H   Continuous Infusions:   PRN Meds:.acetaminophen, alum & mag hydroxide-simeth, fluticasone, hydrOXYzine, LORazepam, magnesium hydroxide, metoprolol tartrate, OLANZapine zydis **AND** [COMPLETED] ziprasidone, ondansetron, mouth rinse   I have personally reviewed following labs and imaging studies  LABORATORY DATA:  Recent Labs  Lab 04/08/22 0050  WBC 6.5  HGB 14.5  HCT 43.8  PLT 254  MCV 88.0  MCH 29.1  MCHC 33.1  RDW 13.2    Recent Labs  Lab 04/08/22 0050  NA 140  K 4.4  CL 107  CO2 27  GLUCOSE 89  BUN 14  CREATININE 1.39*  CALCIUM 8.8*  AST 29  ALT 18  ALKPHOS 58  BILITOT 0.3  ALBUMIN 3.3*  MG 2.1   Lab Results  Component Value Date   TSH 0.887 04/01/2022    RADIOLOGY STUDIES/RESULTS: No results found.   LOS: 104 days   Signature  Huey Bienenstock M.D on 04/09/2022 at 11:03 AM   -  To page go to [email or web address]

## 2022-04-10 NOTE — Progress Notes (Signed)
Mobility Specialist Progress Note:   04/10/22 1001  Mobility  Activity Ambulated with assistance in hallway  Level of Assistance Contact guard assist, steadying assist  Assistive Device Other (Comment) (HHA)  Distance Ambulated (ft) 500 ft  Activity Response Tolerated well  Mobility Referral Yes  $Mobility charge 1 Mobility   Pt received in bed and agreeable. Asymptomatic throughout. Pt left in bed with all needs met and call bell in reach.  Andrey Campanile Mobility Specialist Please contact via SecureChat or  Rehab office at [phone]

## 2022-04-10 NOTE — TOC Progression Note (Signed)
Transition of Care [facility]) - Progression Note    Patient Details  Name: Natalie Brown MRN: [id number] Date of Birth: 1965/10/21  Transition of Care [facility]) CM/SW Contact  Janae Bridgeman, RN Phone Number: 04/10/2022, 12:10 PM  Clinical Narrative:    CM with TOC Team continuing to follow the patient.  Placement barriers are still in place.     Barriers to Discharge: Financial Resources, Inadequate or no insurance, Homeless with medical needs  Expected Discharge Plan and Services In-house Referral: Clinical Social Work   Post Acute Care Choice: Nursing Home Living arrangements for the past 2 months: Homeless [facility]) Expected Discharge Date: 12/24/21                                     Social Determinants of Health (SDOH) Interventions SDOH Screenings   Food Insecurity: No Food Insecurity (03/05/2022)  Housing: High Risk (03/21/2022)  Alcohol Screen: Low Risk  (09/06/2021)  Depression (PHQ2-9): Medium Risk (05/26/2020)  Tobacco Use: High Risk (03/21/2022)    Readmission Risk Interventions     No data to display

## 2022-04-10 NOTE — Progress Notes (Signed)
Mobility Specialist Progress Note:   04/10/22 1603  Mobility  Activity Ambulated with assistance in hallway  Level of Assistance Contact guard assist, steadying assist  Assistive Device Other (Comment) (HHA)  Distance Ambulated (ft) 500 ft  Activity Response Tolerated well  Mobility Referral Yes  $Mobility charge 1 Mobility   Pt received ambulating in hallway and agreeable. Asymptomatic throughout. Pt left in bed with all needs met and call bell in reach.   Andrey Campanile Mobility Specialist Please contact via SecureChat or  Rehab office at [phone]

## 2022-04-10 NOTE — Progress Notes (Signed)
PROGRESS NOTE  Natalie Brown  [id number] DOB: November 30, 1965 DOA: 12/24/2021 PCP: Marliss Coots, NP   Brief Narrative: Patient is a 89 female with history of bipolar disorder -who was hospitalized at [facility] received a Ativan challenge for possible catatonia-with minimal improvement-due to persistent altered mental status/tachycardia-patient was admitted to [facility] service for further evaluation and treatment. No significant finding regarding her tachycardia work-up, 2D echo within normal limit, no significant electrolyte abnormalities. Evaluated by psychiatry-and unfortunately-patient did not meet criteria to go back to [facility]. While awaiting appropriate disposition-hospital course complicated by acute [id number] infection.  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  Mental status is unchanged over the last several days to week.  Thought to be secondary to undiagnosed dementia with significant cerebral/cerebellar atrophy as per imagings.  Looks progressive.  Seen by neurology here.  Recommend outpatient follow-up.  No evidence of seizures.    Bipolar disorder: Psychiatry was following.  On Haldol.  Will recommend outpatient follow-up with psychiatry.  COVID infection: Now asymptomatic.  Clinically resolved Had elevated D-dimer.  CTA chest/lower extremity Doppler negative for VTE.  AKI on CKD stage IIIa: Baseline creatinine 1.3.  Currently kidney function at baseline.  Vitamin B12 deficiency: Continue B12  supplementation  Sinus tachycardia: Currently heart rate is well-controlled.  Thought to be secondary to dehydration.  On propranolol  Tobacco use: On nicotine patch  Disposition: Prolonged hospitalization pending placement.  TOC following         DVT prophylaxis:enoxaparin (LOVENOX) injection 40 mg Start: 12/26/21 1200     Code Status: Full Code  Family Communication: None at bedside  Patient status:Inpatient  Patient is from :Home  Anticipated discharge to:SNF?  Memory care  Estimated DC date:Not sure   Consultants: Psychiatry, neurology  Procedures: None  Antimicrobials:  Anti-infectives (From admission, onward)    None       Subjective: Patient seen and examined at bedside.  She was sleeping, lying on bed at the edge.  Did not respond to any of my verbal stimuli or even touching.  Hemodynamically stable.  Appears comfortable  Objective: Vitals:   04/09/22 0756 04/09/22 2020 04/10/22 0357 04/10/22 0800  BP: (!) 150/88 101/68 97/67 (!) 152/83  Pulse: 83 (!) 59 69 (!) 132  Resp: 18 20    Temp: 98.1 F (36.7 C) 97.9 F (36.6 C) 97.6 F (36.4 C)   TempSrc: Oral Oral Oral   SpO2: 100% 93% 100% 100%  Weight:      Height:        Intake/Output Summary (Last 24 hours) at 04/10/2022 0957 Last data filed at 04/09/2022 2148 Gross per 24 hour  Intake 120 ml  Output --  Net 120 ml   Filed Weights   02/03/22 2025  Weight: 72 kg    Examination:  General exam: Overall comfortable, not in distress HEENT: eyes closed Respiratory system:  no wheezes or crackles  Cardiovascular system: S1 & S2 heard, RRR.  Gastrointestinal system: Abdomen is nondistended, soft and nontender. Central nervous system: Not  alert, awake or oriented Extremities: No  edema, no clubbing ,no cyanosis Skin: No rashes, no ulcers,no icterus     Data Reviewed: I have personally reviewed following labs and imaging studies  CBC: Recent Labs  Lab 04/08/22 0050  WBC 6.5  HGB  14.5  HCT 43.8  MCV 88.0  PLT 254   Basic Metabolic Panel: Recent Labs  Lab 04/08/22 0050  NA 140  K 4.4  CL 107  CO2 27  GLUCOSE 89  BUN 14  CREATININE 1.39*  CALCIUM 8.8*  MG 2.1     No results found for this or any previous visit (from the past 240 hour(s)).   Radiology Studies: No results found.  Scheduled Meds:  (feeding supplement) PROSource Plus  30 mL Oral BID BM   vitamin B-12  1,000 mcg Oral Daily   docusate sodium  200 mg Oral BID   enoxaparin (LOVENOX) injection  40 mg Subcutaneous Q24H   feeding supplement (KATE FARMS STANDARD 1.4)  325 mL Oral BID BM   haloperidol  1 mg Oral BID   hydrocerin   Topical BID   polyethylene glycol  17 g Oral BID   propranolol  10 mg Oral BID   scopolamine  1 patch Transdermal Q72H   Continuous Infusions:   LOS: 105 days   Burnadette Pop, MD [facility] P12/20/2023, 9:57 AM

## 2022-04-11 NOTE — Progress Notes (Signed)
PROGRESS NOTE Natalie Brown  NLZ:[id number] DOB: 1965-05-14 DOA: 12/24/2021 PCP: Lavinia Sharps, NP   Brief Narrative/Hospital Course: 56 y.o.f w/ history of bipolar disorder, HTN hospitalized at [facility], brought into the hospital due to persistent altered mental status/tachycardia for admission to [facility] service.  Major events: 09/07/2021 admitted to behavioral health.  Treated with Zyprexa Invega and Ativan challenge for catatonia. 12/24/2021 sent to ER from [facility] for evaluation for tachycardia 9/04-9/5, 9/5>9/9, LTM EEG  no seizure. TSH stable CTA chest no PE no pneumonia RPR nonreactive bilateral LE Doppler negative for DVT Echo with normal EF  9/23 febrile secondary to Covid 19 + no pneumonia. 9/25 repeat CT brain unremarkable. She has had prolonged hospitalization seen by psychiatry.Per psychiatry does not meet criteria to go back to behavioral health, awaiting for placement to memory care unit.    Subjective: Seen and examined. 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 Mental status is unchanged over the last several days to week.Thought to be secondary to undiagnosed  dementia with significant cerebral/cerebellar atrophy as per imagings and it appears to be progressive.  Seen by neurology here. Recommend outpatient follow-up.  No evidence of seizures.  Delirium precaution, fall precaution and continue supportive care.  Bipolar disorder: Seen by psychiatry, mood stable continue Haldol po. recommended outpatient follow-up with psychiatry.   COVID infection:asymptomatic now, stable. On RA/afebrile. She had elevated D-dimer.CTA chest/lower extremity Doppler negative for VTE.   AKI on CKD stage IIIa: Baseline creatinine 1.3.  Currently kidney function at baseline. Recent Labs  Lab 04/08/22 0050  BUN 14  CREATININE 1.39*    Vitamin B12 deficiency: Resolved last B12 1195 on 12/11, discontinue B12 supplement   Sinus tachycardia: HR stable.Thought to be secondary to dehydration.  cont propranolol for hypotension/bradycardia   Tobacco use: cont nicotine patch   Disposition: Prolonged hospitalization pending placement.  TOC following  DVT prophylaxis: enoxaparin (LOVENOX) injection 40 mg Start: 12/26/21 1200 Code Status:   Code Status: Full Code Family Communication: plan of care discussed with patient at bedside. Patient status is: Inpatient because of pending placement onset disposition Level of care: Med-Surg   Dispo: The patient is from: [facility]            Anticipated disposition: SNF/memory care Objective: Vitals last 24 hrs: Vitals:   04/10/22 1546 04/10/22 2044 04/10/22 2315 04/11/22 0524  BP: (!) 117/91 112/63  (!) 87/78  Pulse: (!) 126 (!) 117 (!) 104 93  Resp:  18  18  Temp: (!) 97.4 F (36.3 C) 98.1 F (36.7 C)    TempSrc: Oral Oral    SpO2: 100% 98%  98%  Weight:      Height:       Weight change:  Physical Examination: General exam: alert awake, blank stare, oriented x 0-1 older than stated age HEENT:Oral mucosa moist, Ear/Nose WNL grossly Respiratory system: bilaterally CLEAR BS, no use of accessory  muscle Cardiovascular system: S1 & S2 +, No JVD. Gastrointestinal system: Abdomen soft,NT,ND, BS+ Nervous System:Alert, awake, moving extremities. Extremities: LE edema NEG,distal peripheral pulses palpable.  Skin: No rashes,no icterus. MSK: Normal muscle bulk,tone, power  Medications reviewed:  Scheduled Meds:  (feeding supplement) PROSource Plus  30 mL Oral BID BM   vitamin B-12  1,000 mcg Oral Daily   docusate sodium  200 mg Oral BID   enoxaparin (LOVENOX) injection  40 mg Subcutaneous Q24H   feeding supplement (KATE FARMS STANDARD 1.4)  325 mL Oral BID BM   haloperidol  1 mg Oral BID   hydrocerin   Topical BID   polyethylene glycol  17 g Oral BID   propranolol  10 mg Oral BID   scopolamine  1 patch Transdermal Q72H  Continuous Infusions:   Diet Order             Diet regular Room service appropriate? No; Fluid consistency: Thin  Diet effective now                   Intake/Output Summary (Last 24 hours) at 04/11/2022 0852 Last data filed at 04/10/2022 1619 Gross per 24 hour  Intake 120 ml  Output --  Net 120 ml   Net IO Since Admission: 28,578 mL [04/11/22 0852]  Wt Readings from Last 3 Encounters:  02/03/22 72 kg  12/24/21 72 kg  09/05/21 72.6 kg     Unresulted Labs (From admission, onward)     Start     Ordered   03/25/22 0000  CBC  Weekly,   R     Question:  Specimen collection method  Answer:  Lab=Lab collect   03/23/22 0907   03/25/22 0000  Comprehensive metabolic panel  Weekly,   R     Question:  Specimen collection method  Answer:  Lab=Lab collect   03/23/22 0907   03/25/22 0000  Magnesium  Weekly,   R     Question:  Specimen collection method  Answer:  Lab=Lab collect   03/23/22 0907          Data Reviewed: I have personally reviewed following labs and imaging studies CBC: Recent Labs  Lab 04/08/22 0050  WBC 6.5  HGB 14.5  HCT 43.8  MCV 88.0  PLT 254   Basic Metabolic Panel: Recent Labs  Lab 04/08/22 0050  NA 140  K 4.4  CL  107  CO2 27  GLUCOSE 89  BUN 14  CREATININE 1.39*  CALCIUM 8.8*  MG 2.1   GFR: Estimated Creatinine Clearance: 47 mL/min (A) (by C-G formula based on SCr of 1.39 mg/dL (H)). Liver Function Tests: Recent Labs  Lab 04/08/22 0050  AST 29  ALT 18  ALKPHOS 58  BILITOT 0.3  PROT 6.4*  ALBUMIN 3.3*   No results found for this or any previous visit (from the past 240 hour(s)).  Antimicrobials: Anti-infectives (From admission, onward)    None      Culture/Microbiology    Component Value Date/Time   SDES BLOOD LEFT HAND 01/12/2022 0828   SDES BLOOD RIGHT HAND 01/12/2022 0828   SPECREQUEST  01/12/2022 0828    BOTTLES DRAWN AEROBIC ONLY Blood Culture results may not be optimal due to an inadequate volume of blood received in culture bottles   SPECREQUEST  01/12/2022 0828  BOTTLES DRAWN AEROBIC ONLY Blood Culture results may not be optimal due to an inadequate volume of blood received in culture bottles   CULT  01/12/2022 0828    NO GROWTH 5 DAYS Performed at [facility], [address] [place], [place] [address]    CULT  01/12/2022 [phone]    NO GROWTH 5 DAYS Performed at [facility], [address], [place], [place] [address]    REPTSTATUS 01/17/2022 FINAL 01/12/2022 4235   REPTSTATUS 01/17/2022 FINAL 01/12/2022 3614  Radiology Studies: No results found.   LOS: 106 days   Lanae Boast, MD [facility]  04/11/2022, 8:52 AM

## 2022-04-11 NOTE — Plan of Care (Signed)

## 2022-04-12 NOTE — Progress Notes (Signed)
Mobility Specialist Progress Note:   04/12/22 1052  Mobility  Activity Ambulated with assistance in hallway  Level of Assistance Contact guard assist, steadying assist  Assistive Device Other (Comment) (HHA)  Distance Ambulated (ft) 200 ft  Activity Response Tolerated well  Mobility Referral Yes  $Mobility charge 1 Mobility   Pt received ambulating in hallway with bed alarm ringing. Pt redirected back to bed with assistance from NT. Pt left in bed with all needs met, call bell in reach, and bed alarm reset.   Andrey Campanile Mobility Specialist Please contact via SecureChat or  Rehab office at [phone]

## 2022-04-12 NOTE — Plan of Care (Signed)
  Problem: Activity: Goal: Risk for activity intolerance will decrease Outcome: Progressing   Problem: Safety: Goal: Ability to remain free from injury will improve Outcome: Progressing   

## 2022-04-12 NOTE — Progress Notes (Signed)
PROGRESS NOTE Natalie Brown  EHO:[id number] DOB: 13-May-1965 DOA: 12/24/2021 PCP: Lavinia Sharps, NP   Brief Narrative/Hospital Course: 56 y.o.f w/ history of bipolar disorder, HTN hospitalized at [facility], brought into the hospital due to persistent altered mental status/tachycardia for admission to [facility] service.  Major events: 09/07/2021 admitted to behavioral health.  Treated with Zyprexa Invega and Ativan challenge for catatonia. 12/24/2021 sent to ER from [facility] for evaluation for tachycardia 9/04-9/5, 9/5>9/9, LTM EEG  no seizure. TSH stable CTA chest no PE no pneumonia RPR nonreactive bilateral LE Doppler negative for DVT Echo with normal EF  9/23 febrile secondary to Covid 19 + no pneumonia. 9/25 repeat CT brain unremarkable. She has had prolonged hospitalization seen by psychiatry.Per psychiatry does not meet criteria to go back to behavioral health, awaiting for placement to memory care unit.    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 Mental status is  unchanged over the last several days to week> wandering in the room and eating what ever she gets/choking so placed on one-to-one 12/21. Felt it is 2/2 undiagnosed dementia with significant cerebral/cerebellar atrophy as per imagings and it appears to be progressive.  Seen by neurology here. Recommend outpatient follow-up.  No evidence of seizures.  Delirium precaution, fall precaution and continue supportive care.  Bipolar disorder: Seen by psychiatry, mood stable continue Haldol po. recommended outpatient follow-up with psychiatry.   COVID infection:asymptomatic now, stable. On RA/afebrile. She had elevated D-dimer.CTA chest/lower extremity Doppler negative for VTE.   AKI on CKD stage IIIa: Baseline creatinine 1.3.  Currently kidney function at baseline. Recent Labs  Lab 04/08/22 0050  BUN 14  CREATININE 1.39*    Vitamin B12 deficiency: Resolved last B12 1195 on 12/11, discontinued 6 6 B12 supplement   Sinus tachycardia: HR stable.Thought to be secondary to dehydration.  cont propranolol for hypotension/bradycardia   Tobacco use: cont nicotine patch   Disposition: Prolonged hospitalization pending placement.  TOC following  DVT prophylaxis: enoxaparin (LOVENOX) injection 40 mg Start: 12/26/21 1200 Code Status:   Code Status: Full Code Family Communication: plan of care discussed with patient at bedside. Patient status is: Inpatient because of pending placement onset disposition Level of care: Med-Surg   Dispo: The patient is from: [facility]            Anticipated disposition: SNF/memory care Objective: Vitals last 24 hrs: Vitals:   04/11/22 2105 04/12/22 0500 04/12/22 0624 04/12/22 0814  BP: 93/76  95/60   Pulse: 79  94 (!) 105  Resp: 17  17 16   Temp: 98 F (36.7 C)  98.2 F (36.8 C)  98.7 F (37.1 C)  TempSrc:    Oral  SpO2: 100% 100% (!) 88% 100%  Weight:      Height:       Weight change:   Physical Examination: General exam: AA, does not interact or  communicate HEENT:Oral mucosa moist, Ear/Nose WNL grossly, dentition normal. Respiratory system: bilaterally clear BS,no use of accessory muscle Cardiovascular system: S1 & S2 +, regular rate. Gastrointestinal system: Abdomen soft, NT,ND,BS+ Nervous System:Alert, awake, moving all extremities and grossly nonfocal Extremities: LE ankle edema neg, lower extremities warm Skin: No rashes,no icterus. MSK: Normal muscle bulk,tone, power   Medications reviewed:  Scheduled Meds:  (feeding supplement) PROSource Plus  30 mL Oral BID BM   docusate sodium  200 mg Oral BID   enoxaparin (LOVENOX) injection  40 mg Subcutaneous Q24H   feeding supplement (KATE FARMS STANDARD 1.4)  325 mL Oral BID BM   haloperidol  1 mg Oral BID   hydrocerin   Topical BID   polyethylene glycol  17 g Oral BID   propranolol  10 mg Oral BID   scopolamine  1 patch Transdermal Q72H  Continuous Infusions:   Diet Order             Diet regular Room service appropriate? No; Fluid consistency: Thin  Diet effective now                   Intake/Output Summary (Last 24 hours) at 04/12/2022 1020 Last data filed at 04/12/2022 0500 Gross per 24 hour  Intake 60 ml  Output --  Net 60 ml   Net IO Since Admission: 28,638 mL [04/12/22 1020]  Wt Readings from Last 3 Encounters:  02/03/22 72 kg  12/24/21 72 kg  09/05/21 72.6 kg     Unresulted Labs (From admission, onward)     Start     Ordered   03/25/22 0000  CBC  Weekly,   R     Question:  Specimen collection method  Answer:  Lab=Lab collect   03/23/22 0907   03/25/22 0000  Comprehensive metabolic panel  Weekly,   R     Question:  Specimen collection method  Answer:  Lab=Lab collect   03/23/22 0907   03/25/22 0000  Magnesium  Weekly,   R     Question:  Specimen collection method  Answer:  Lab=Lab collect   03/23/22 0907          Data Reviewed: I have personally reviewed following labs and imaging studies CBC: Recent Labs  Lab 04/08/22 0050  WBC 6.5   HGB 14.5  HCT 43.8  MCV 88.0  PLT 254   Basic Metabolic Panel: Recent Labs  Lab 04/08/22 0050  NA 140  K 4.4  CL 107  CO2 27  GLUCOSE 89  BUN 14  CREATININE 1.39*  CALCIUM 8.8*  MG 2.1   GFR: Estimated Creatinine Clearance: 47 mL/min (A) (by C-G formula based on SCr of 1.39 mg/dL (H)). Liver Function Tests: Recent Labs  Lab 04/08/22 0050  AST 29  ALT 18  ALKPHOS 58  BILITOT 0.3  PROT 6.4*  ALBUMIN 3.3*   No results found for this or any previous visit (from the past 240 hour(s)).  Antimicrobials: Anti-infectives (From admission, onward)    None      Culture/Microbiology    Component Value Date/Time   SDES BLOOD LEFT HAND 01/12/2022 0828   SDES BLOOD RIGHT HAND 01/12/2022 0828   SPECREQUEST  01/12/2022 0828    BOTTLES  DRAWN AEROBIC ONLY Blood Culture results may not be optimal due to an inadequate volume of blood received in culture bottles   SPECREQUEST  01/12/2022 0828    BOTTLES DRAWN AEROBIC ONLY Blood Culture results may not be optimal due to an inadequate volume of blood received in culture bottles   CULT  01/12/2022 0828    NO GROWTH 5 DAYS Performed at [facility], [address] [place], [place] [address]    CULT  01/12/2022 [phone]    NO GROWTH 5 DAYS Performed at [facility], [address], [place], [place] [address]    REPTSTATUS 01/17/2022 FINAL 01/12/2022 [id number]   REPTSTATUS 01/17/2022 FINAL 01/12/2022 9937  Radiology Studies: No results found.   LOS: 107 days   Lanae Boast, MD [facility]  04/12/2022, 10:20 AM

## 2022-04-12 NOTE — Progress Notes (Signed)
Mobility Specialist Progress Note:   04/12/22 1037  Mobility  Activity Ambulated with assistance in hallway  Level of Assistance Contact guard assist, steadying assist  Assistive Device Other (Comment) (HHA)  Distance Ambulated (ft) 500 ft  Activity Response Tolerated well  Mobility Referral Yes  $Mobility charge 1 Mobility   Pt received in bed and agreeable. Asymptomatic throughout. Pt left in bed with all needs met, call bell in reach, and bed alarm on.   Andrey Campanile Mobility Specialist Please contact via SecureChat or  Rehab office at [phone]

## 2022-04-13 MED ORDER — LORAZEPAM 1 MG PO TABS: 2.0000 mg | ORAL_TABLET | Freq: Once | ORAL | Status: DC

## 2022-04-13 MED ORDER — LORAZEPAM 2 MG/ML IJ SOLN: 1.0000 mg | Freq: Once | INTRAMUSCULAR | Status: DC

## 2022-04-13 NOTE — Progress Notes (Signed)
1100: Pt continually trying to get out of bed and is not redirectable, attempting to walk into other pt rooms and staff kitchen area.  Pt directed back to bed and PRN ativan given. MD also messaged for a 1:1 Sitter  1200: Staffing reports there is not enough staff for 1:1 sitter, will keep tele sitter in place until one is available.   1500: Pt becoming increasingly agitated and non redirectable. Pt trying to wander to elevator, it took 3 staff to direct Pt back to room. Nurse tech pulled to watch Pt.   1600 MD ordered a one time dose of PRN ativan  1800 Pt did not require PRN ativan and has been doing well with  bedside sitter. Pt did have one episode of incontinence and urinated on the floor. Pt cleaned up and placed back in bed

## 2022-04-13 NOTE — Progress Notes (Signed)
Patient has had 3 bouts of loose motions after the bowel regimen. I guess that should be held today to avoid sending her into massive diarrhea.

## 2022-04-13 NOTE — Progress Notes (Signed)
PROGRESS NOTE CECILLE MCCLUSKY  PPI:[id number] DOB: [birth year] DOA: 12/24/2021 PCP: Lavinia Sharps, NP   Brief Narrative/Hospital Course: No notes on file    Subjective: Some loose stool overnight. 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 Mental status is unchanged over the last several days to week> wandering in the room so placed on one-to-one 12/21. Felt it is 2/2 undiagnosed dementia with significant cerebral/cerebellar atrophy as per imagings and it appears to be progressive.  Seen by neurology here. Recommend outpatient follow-up. B12/tsh/hiv/rpr negative. No evidence of seizures.  Delirium precaution, fall precaution and continue supportive care. Dysphagie 3 diet  Bipolar disorder: Seen by psychiatry, mood stable continue Haldol po. recommended outpatient follow-up with psychiatry.   COVID infection:asymptomatic now, stable. On RA/afebrile. She had elevated D-dimer.CTA chest/lower extremity Doppler negative for VTE.  Loose stools overnight, no diarrhea. Will hold bowel regimen and monitor   AKI on CKD stage IIIa: Baseline creatinine 1.3.  Currently kidney function at baseline. Weekly labs ordered   Vitamin B12 deficiency: Resolved last B12 1195 on 12/11, discontinued 6 6 B12 supplement   Sinus tachycardia: HR stable.Thought to be secondary to dehydration. Holding propranolol 2/2 soft bps   Tobacco  use: cont nicotine patch   Disposition: Prolonged hospitalization pending placement.  TOC following  DVT prophylaxis: enoxaparin (LOVENOX) injection 40 mg Start: 12/26/21 1200 Code Status:   Code Status: Full Code Family Communication: none @ bedside Patient status is: Inpatient because of pending placement onset disposition Level of care: Med-Surg   Dispo: The patient is from: [facility]            Anticipated disposition: SNF/memory care Objective: Vitals last 24 hrs: Vitals:   04/12/22 0814 04/12/22 2035 04/13/22 0526 04/13/22 0815  BP:    102/74  Pulse: (!) 105 (!) 109 79 79  Resp: 16 17 17 16   Temp: 98.7 F (37.1 C) 98.7 F (37.1 C) 97.7 F (36.5 C) 98.5 F (36.9 C)  TempSrc: Oral Oral Oral Oral  SpO2: 100% 100% 100% 100%  Weight:      Height:       Weight change:   Physical Examination: General exam: AA, does not interact or communicate HEENT:Oral mucosa moist, Ear/Nose WNL grossly, dentition normal., drooling Respiratory system: bilaterally clear BS,no use of accessory muscle Cardiovascular system: S1 & S2 +, regular rate. Gastrointestinal system: Abdomen soft, NT,ND,BS+ Nervous System:Alert, awake, moving all extremities and grossly nonfocal Extremities: trace LE edema Skin: No rashes,no icterus. MSK: Normal muscle bulk,tone, power   Medications reviewed:  Scheduled Meds:  (feeding supplement) PROSource Plus  30 mL Oral BID BM   docusate sodium  200 mg Oral BID   enoxaparin (LOVENOX) injection  40 mg Subcutaneous Q24H   feeding supplement (KATE FARMS STANDARD 1.4)  325 mL Oral BID BM   haloperidol  1 mg Oral BID   hydrocerin   Topical BID   polyethylene glycol  17 g Oral BID   scopolamine  1 patch Transdermal Q72H  Continuous Infusions:   Diet Order             Diet regular Room service appropriate? No; Fluid consistency: Thin  Diet effective now                  No intake or output data in the 24 hours ending 04/13/22 0929  Net IO  Since Admission: 28,638 mL [04/13/22 0929]  Wt Readings from Last 3 Encounters:  02/03/22 72 kg  12/24/21 72 kg  09/05/21 72.6 kg     Unresulted Labs (From admission, onward)     Start     Ordered   03/25/22 0000  CBC  Weekly,   R     Question:  Specimen collection method  Answer:  Lab=Lab collect   03/23/22 0907   03/25/22 0000  Comprehensive metabolic panel  Weekly,   R     Question:  Specimen collection method  Answer:  Lab=Lab collect   03/23/22 0907   03/25/22 0000  Magnesium  Weekly,   R     Question:  Specimen collection method  Answer:  Lab=Lab collect   03/23/22 0907          Data Reviewed: I have personally reviewed following labs and imaging studies CBC: Recent Labs  Lab 04/08/22 0050  WBC 6.5  HGB 14.5  HCT 43.8  MCV 88.0  PLT 254   Basic Metabolic Panel: Recent Labs  Lab 04/08/22 0050  NA 140  K 4.4  CL 107  CO2 27  GLUCOSE 89  BUN 14  CREATININE 1.39*  CALCIUM 8.8*  MG 2.1   GFR: Estimated Creatinine Clearance: 47 mL/min (A) (by C-G formula based on SCr of 1.39 mg/dL (H)). Liver Function Tests: Recent Labs  Lab 04/08/22 0050  AST 29  ALT 18  ALKPHOS 58  BILITOT 0.3  PROT 6.4*  ALBUMIN 3.3*   No results found for this or any previous visit (from the past 240 hour(s)).  Antimicrobials: Anti-infectives (From admission, onward)    None      Culture/Microbiology    Component Value Date/Time   SDES BLOOD LEFT HAND 01/12/2022 0828   SDES BLOOD RIGHT HAND 01/12/2022 0828   SPECREQUEST  01/12/2022 0828    BOTTLES DRAWN AEROBIC ONLY Blood Culture results may not be optimal due to an inadequate volume of blood received in culture bottles   SPECREQUEST  01/12/2022 0828    BOTTLES DRAWN AEROBIC ONLY Blood Culture results may not be optimal due to an inadequate volume of blood received in culture bottles   CULT  01/12/2022 0828    NO GROWTH 5 DAYS Performed at [facility], [address] [place], [place] [address]    CULT   01/12/2022 [phone]    NO GROWTH 5 DAYS Performed at [facility], [address], [place], [place] [address]    REPTSTATUS 01/17/2022 FINAL 01/12/2022 [id number]   REPTSTATUS 01/17/2022 FINAL 01/12/2022 2841  Radiology Studies: No results found.   LOS: 108 days   Silvano Bilis, MD [facility]  04/13/2022, 9:29 AM

## 2022-04-13 NOTE — Progress Notes (Signed)
Mobility Specialist Progress Note:   04/13/22 0900  Mobility  Activity Ambulated with assistance in hallway  Level of Assistance Contact guard assist, steadying assist  Assistive Device Other (Comment) (HHA)  Distance Ambulated (ft) 500 ft  Activity Response Tolerated well  Mobility Referral Yes  $Mobility charge 1 Mobility   Pt agreeable to mobility session. Required HHA for comfort. Pt back in bed with all needs met.   Nelta Numbers Mobility Specialist Please contact via SecureChat or  Rehab office at [phone]

## 2022-04-14 NOTE — Progress Notes (Signed)
PROGRESS NOTE    Natalie Brown  UDJ:[id number] DOB: Aug 08, 1965 DOA: 12/24/2021 PCP: Lavinia Sharps, NP   Brief Narrative:  56 y.o.f w/ history of bipolar disorder, HTN hospitalized at [facility], brought into the hospital due to persistent altered mental status/tachycardia for admission to [facility] service.  Major events: 09/07/2021 admitted to behavioral health.  Treated with Zyprexa Invega and Ativan challenge for catatonia. 12/24/2021 sent to ER from [facility] for evaluation for tachycardia 9/04-9/5, 9/5>9/9, LTM EEG  no seizure. TSH stable CTA chest no PE no pneumonia RPR nonreactive bilateral LE Doppler negative for DVT Echo with normal EF  9/23 febrile secondary to Covid 19 + no pneumonia. 9/25 repeat CT brain unremarkable. She has had prolonged hospitalization seen by psychiatry.Per psychiatry does not meet criteria to go back to behavioral health, awaiting for placement to memory care unit.   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 Mental status is unchanged over the last several days to week> wandering in the room and eating what ever she gets/choking so placed on one-to-one 12/21. Felt it is 2/2 undiagnosed dementia with significant cerebral/cerebellar atrophy as per imagings and it appears to be progressive.  Seen by neurology here. Recommend outpatient follow-up.  No evidence of seizures.  Delirium precaution, fall precaution and continue supportive care.   Bipolar  disorder: Seen by psychiatry, mood stable continue Haldol po. recommended outpatient follow-up with psychiatry.   COVID infection:asymptomatic now, stable. On RA/afebrile. She had elevated D-dimer.CTA chest/lower extremity Doppler negative for VTE.   AKI on CKD stage IIIa: Baseline creatinine 1.3.  Currently kidney function at baseline.  Vitamin B12 deficiency: Resolved last B12 1195 on 12/11, discontinued 6 6 B12 supplement   Sinus tachycardia: HR stable.Thought to be secondary to dehydration.  cont propranolol for hypotension/bradycardia   Tobacco use: cont nicotine patch   Disposition: Prolonged hospitalization pending placement.  TOC following  DVT prophylaxis: enoxaparin (LOVENOX) injection 40 mg Start: 12/26/21 1200   Code Status: Full Code  Family Communication:  None present at bedside.   Status is: Inpatient Remains inpatient appropriate because: Pending placement to memory care unit, medically stable.   Estimated body mass index is 25.62 kg/m as calculated from the following:   Height as of this encounter: 5\' 6"  (1.676 m).   Weight as of this encounter: 72 kg.    Nutritional Assessment: Body mass index is 25.62 kg/m. Seen by dietician.  I agree with the assessment and plan as outlined below: Nutrition Status:        . Skin Assessment: I have examined the patient's skin and I agree with the wound assessment as performed by the wound care RN as outlined below:    Consultants:  Psychiatry-signed off Neurology-signed off  Procedures:  None  Antimicrobials:  Anti-infectives (From admission, onward)    None         Subjective: Patient seen and examined, she is almost nonverbal, talks whenever and what ever she likes but does not really respond to any questions.  Objective: Vitals:  04/13/22 0526 04/13/22 0815 04/13/22 1936 04/14/22 0800  BP:  102/74 98/67 102/70  Pulse: 79 79 85 81  Resp: 17 16 17 18   Temp: 97.7 F (36.5 C) 98.5 F (36.9 C)  98 F (36.7 C) 98.3 F (36.8 C)  TempSrc: Oral Oral  Oral  SpO2: 100% 100% 100% 100%  Weight:      Height:        Intake/Output Summary (Last 24 hours) at 04/14/2022 1049 Last data filed at 04/13/2022 1809 Gross per 24 hour  Intake 240 ml  Output --  Net 240 ml   Filed Weights   02/03/22 2025  Weight: 72 kg    Examination:  General exam: Appears calm and comfortable  Respiratory system: Clear to auscultation. Respiratory effort normal. Cardiovascular system: S1 & S2 heard, RRR. No JVD, murmurs, rubs, gallops or clicks. No pedal edema. Gastrointestinal system: Abdomen is nondistended, soft and nontender. No organomegaly or masses felt. Normal bowel sounds heard. Central nervous system: Alert but not oriented.  Data Reviewed: I have personally reviewed following labs and imaging studies  CBC: Recent Labs  Lab 04/08/22 0050  WBC 6.5  HGB 14.5  HCT 43.8  MCV 88.0  PLT 254   Basic Metabolic Panel: Recent Labs  Lab 04/08/22 0050  NA 140  K 4.4  CL 107  CO2 27  GLUCOSE 89  BUN 14  CREATININE 1.39*  CALCIUM 8.8*  MG 2.1   GFR: Estimated Creatinine Clearance: 47 mL/min (A) (by C-G formula based on SCr of 1.39 mg/dL (H)). Liver Function Tests: Recent Labs  Lab 04/08/22 0050  AST 29  ALT 18  ALKPHOS 58  BILITOT 0.3  PROT 6.4*  ALBUMIN 3.3*   No results for input(s): "LIPASE", "AMYLASE" in the last 168 hours. No results for input(s): "AMMONIA" in the last 168 hours. Coagulation Profile: No results for input(s): "INR", "PROTIME" in the last 168 hours. Cardiac Enzymes: No results for input(s): "CKTOTAL", "CKMB", "CKMBINDEX", "TROPONINI" in the last 168 hours. BNP (last 3 results) No results for input(s): "PROBNP" in the last 8760 hours. HbA1C: No results for input(s): "HGBA1C" in the last 72 hours. CBG: No results for input(s): "GLUCAP" in the last 168 hours. Lipid Profile: No results for input(s): "CHOL", "HDL", "LDLCALC", "TRIG", "CHOLHDL",  "LDLDIRECT" in the last 72 hours. Thyroid Function Tests: No results for input(s): "TSH", "T4TOTAL", "FREET4", "T3FREE", "THYROIDAB" in the last 72 hours. Anemia Panel: No results for input(s): "VITAMINB12", "FOLATE", "FERRITIN", "TIBC", "IRON", "RETICCTPCT" in the last 72 hours. Sepsis Labs: No results for input(s): "PROCALCITON", "LATICACIDVEN" in the last 168 hours.  No results found for this or any previous visit (from the past 240 hour(s)).   Radiology Studies: No results found.  Scheduled Meds:  (feeding supplement) PROSource Plus  30 mL Oral BID BM   enoxaparin (LOVENOX) injection  40 mg Subcutaneous Q24H   feeding supplement (KATE FARMS STANDARD 1.4)  325 mL Oral BID BM   haloperidol  1 mg Oral BID   hydrocerin   Topical BID   LORazepam  2 mg Oral Once   scopolamine  1 patch Transdermal Q72H   Continuous Infusions:   LOS: 109 days   04/10/22, MD [facility]  04/14/2022, 10:49 AM   *Please note that this is a verbal dictation therefore any spelling or grammatical errors are due to the "Dragon Medical One" system interpretation.  Please page via Amion and do not message via secure chat for urgent patient care matters. Secure chat  can be used for non urgent patient care matters.  How to contact the [facility] Attending or Consulting provider [place] or covering provider during after hours [place], for this patient?  Check the care team in [facility] and look for a) attending/consulting TRH provider listed and b) the [facility] team listed. Page or secure chat 7A-7P. Log into [email or web address] and use [place]'s universal password to access. If you do not have the password, please contact the hospital operator. Locate the [facility] provider you are looking for under [facility] and page to a number that you can be directly reached. If you still have difficulty reaching the provider, please page the [facility] (Director on Call) for the Hospitalists listed on amion for assistance.

## 2022-04-15 LAB — COMPREHENSIVE METABOLIC PANEL
ALT: 28 U/L (ref 0–44)
AST: 39 U/L (ref 15–41)
Albumin: 3.3 g/dL — ABNORMAL LOW (ref 3.5–5.0)
Alkaline Phosphatase: 48 U/L (ref 38–126)
Anion gap: 10 (ref 5–15)
BUN: 11 mg/dL (ref 6–20)
CO2: 24 mmol/L (ref 22–32)
Calcium: 9.1 mg/dL (ref 8.9–10.3)
Chloride: 107 mmol/L (ref 98–111)
Creatinine, Ser: 1.18 mg/dL — ABNORMAL HIGH (ref 0.44–1.00)
GFR, Estimated: 55 mL/min — ABNORMAL LOW (ref >60)
Glucose, Bld: 84 mg/dL (ref 70–99)
Potassium: 5.2 mmol/L — ABNORMAL HIGH (ref 3.5–5.1)
Sodium: 141 mmol/L (ref 135–145)
Total Bilirubin: 1.3 mg/dL — ABNORMAL HIGH (ref 0.3–1.2)
Total Protein: 6.2 g/dL — ABNORMAL LOW (ref 6.5–8.1)

## 2022-04-15 LAB — CBC
HCT: 44.1 % (ref 36.0–46.0)
Hemoglobin: 14.4 g/dL (ref 12.0–15.0)
MCH: 29.1 pg (ref 26.0–34.0)
MCHC: 32.7 g/dL (ref 30.0–36.0)
MCV: 89.1 fL (ref 80.0–100.0)
Platelets: 257 10*3/uL (ref 150–400)
RBC: 4.95 MIL/uL (ref 3.87–5.11)
RDW: 13.1 % (ref 11.5–15.5)
WBC: 4.7 10*3/uL (ref 4.0–10.5)
nRBC: 0 % (ref 0.0–0.2)

## 2022-04-15 LAB — MAGNESIUM: Magnesium: 2.3 mg/dL (ref 1.7–2.4)

## 2022-04-15 NOTE — Progress Notes (Signed)
The patient is awake trying to dial some numbers on the phone. The spouse found her super sleepy and had wished if she called him. This RN tried the number in the file but it did not go through. The patient is calm and relaxed trying to dial some numbers.

## 2022-04-15 NOTE — Progress Notes (Signed)
Mobility Specialist Progress Note:   04/15/22 2633  Mobility  Activity Ambulated independently in hallway  Level of Assistance Independent  Assistive Device None  Distance Ambulated (ft) 300 ft  Activity Response Tolerated well  Mobility Referral Yes  $Mobility charge 1 Mobility   Pt received ambulating in hallway and agreeable. No complaints. Pt left in bed with all needs met, call bell in reach, and bed alarm on.   Andrey Campanile Mobility Specialist Please contact via SecureChat or  Rehab office at [phone]

## 2022-04-15 NOTE — Progress Notes (Signed)
PROGRESS NOTE    Natalie Brown  BTY:[id number] DOB: 1966-02-24 DOA: 12/24/2021 PCP: Lavinia Sharps, NP   Brief Narrative:  56 y.o.f w/ history of bipolar disorder, HTN hospitalized at [facility], brought into the hospital due to persistent altered mental status/tachycardia for admission to [facility] service.  Major events: 09/07/2021 admitted to behavioral health.  Treated with Zyprexa Invega and Ativan challenge for catatonia. 12/24/2021 sent to ER from [facility] for evaluation for tachycardia 9/04-9/5, 9/5>9/9, LTM EEG  no seizure. TSH stable CTA chest no PE no pneumonia RPR nonreactive bilateral LE Doppler negative for DVT Echo with normal EF  9/23 febrile secondary to Covid 19 + no pneumonia. 9/25 repeat CT brain unremarkable. She has had prolonged hospitalization seen by psychiatry.Per psychiatry does not meet criteria to go back to behavioral health, awaiting for placement to memory care unit.   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 Mental status is unchanged over the last several days to week> wandering in the room and eating what ever she gets/choking so placed on one-to-one 12/21. Felt it is 2/2 undiagnosed dementia with significant cerebral/cerebellar atrophy as per imagings and it appears to be progressive.  Seen by neurology here. Recommend outpatient follow-up.  No evidence of seizures.  Delirium precaution, fall precaution and continue supportive care.   Bipolar  disorder: Seen by psychiatry, mood stable continue Haldol po. recommended outpatient follow-up with psychiatry.   COVID infection:asymptomatic now, stable. On RA/afebrile. She had elevated D-dimer.CTA chest/lower extremity Doppler negative for VTE.   AKI on CKD stage IIIa: Baseline creatinine 1.3.  Currently kidney function at baseline.  Vitamin B12 deficiency: Resolved last B12 1195 on 12/11, discontinued 6 6 B12 supplement   Sinus tachycardia: HR stable.Thought to be secondary to dehydration.  cont propranolol for hypotension/bradycardia   Tobacco use: cont nicotine patch   Disposition: Prolonged hospitalization pending placement.  TOC following  DVT prophylaxis: enoxaparin (LOVENOX) injection 40 mg Start: 12/26/21 1200   Code Status: Full Code  Family Communication:  None present at bedside.   Status is: Inpatient Remains inpatient appropriate because: Pending placement to memory care unit, medically stable.   Estimated body mass index is 25.62 kg/m as calculated from the following:   Height as of this encounter: 5\' 6"  (1.676 m).   Weight as of this encounter: 72 kg.    Nutritional Assessment: Body mass index is 25.62 kg/m. Seen by dietician.  I agree with the assessment and plan as outlined below: Nutrition Status:        . Skin Assessment: I have examined the patient's skin and I agree with the wound assessment as performed by the wound care RN as outlined below:    Consultants:  Psychiatry-signed off Neurology-signed off  Procedures:  None  Antimicrobials:  Anti-infectives (From admission, onward)    None         Subjective: Seen and examined.  Patient almost nonverbal and has no complaints.  [name] status quo.  Objective: Vitals:   04/13/22 0815 04/13/22 1936 04/14/22  0800 04/15/22 0530  BP: 102/74 98/67 102/70 111/70  Pulse: 79 85 81 (!) 107  Resp: 16 17 18 17   Temp: 98.5 F (36.9 C) 98 F (36.7 C) 98.3 F (36.8 C) 98.4 F (36.9 C)   TempSrc: Oral  Oral Oral  SpO2: 100% 100% 100% 100%  Weight:      Height:       No intake or output data in the 24 hours ending 04/15/22 1008  Filed Weights   02/03/22 2025  Weight: 72 kg    Examination:  General exam: Appears calm and comfortable  Respiratory system: Clear to auscultation. Respiratory effort normal. Cardiovascular system: S1 & S2 heard, RRR. No JVD, murmurs, rubs, gallops or clicks. No pedal edema. Gastrointestinal system: Abdomen is nondistended, soft and nontender. No organomegaly or masses felt. Normal bowel sounds heard. Central nervous system: Alert but not oriented.  Data Reviewed: I have personally reviewed following labs and imaging studies  CBC: Recent Labs  Lab 04/15/22 0233  WBC 4.7  HGB 14.4  HCT 44.1  MCV 89.1  PLT 257    Basic Metabolic Panel: Recent Labs  Lab 04/15/22 0233  NA 141  K 5.2*  CL 107  CO2 24  GLUCOSE 84  BUN 11  CREATININE 1.18*  CALCIUM 9.1  MG 2.3    GFR: Estimated Creatinine Clearance: 55.4 mL/min (A) (by C-G formula based on SCr of 1.18 mg/dL (H)). Liver Function Tests: Recent Labs  Lab 04/15/22 0233  AST 39  ALT 28  ALKPHOS 48  BILITOT 1.3*  PROT 6.2*  ALBUMIN 3.3*    No results for input(s): "LIPASE", "AMYLASE" in the last 168 hours. No results for input(s): "AMMONIA" in the last 168 hours. Coagulation Profile: No results for input(s): "INR", "PROTIME" in the last 168 hours. Cardiac Enzymes: No results for input(s): "CKTOTAL", "CKMB", "CKMBINDEX", "TROPONINI" in the last 168 hours. BNP (last 3 results) No results for input(s): "PROBNP" in the last 8760 hours. HbA1C: No results for input(s): "HGBA1C" in the last 72 hours. CBG: No results for input(s): "GLUCAP" in the last 168 hours. Lipid Profile: No results for input(s): "CHOL", "HDL", "LDLCALC", "TRIG", "CHOLHDL", "LDLDIRECT" in the last 72 hours. Thyroid Function Tests: No results for input(s): "TSH", "T4TOTAL", "FREET4", "T3FREE",  "THYROIDAB" in the last 72 hours. Anemia Panel: No results for input(s): "VITAMINB12", "FOLATE", "FERRITIN", "TIBC", "IRON", "RETICCTPCT" in the last 72 hours. Sepsis Labs: No results for input(s): "PROCALCITON", "LATICACIDVEN" in the last 168 hours.  No results found for this or any previous visit (from the past 240 hour(s)).   Radiology Studies: No results found.  Scheduled Meds:  (feeding supplement) PROSource Plus  30 mL Oral BID BM   enoxaparin (LOVENOX) injection  40 mg Subcutaneous Q24H   feeding supplement (KATE FARMS STANDARD 1.4)  325 mL Oral BID BM   haloperidol  1 mg Oral BID   hydrocerin   Topical BID   LORazepam  2 mg Oral Once   scopolamine  1 patch Transdermal Q72H   Continuous Infusions:   LOS: 110 days   04/17/22, MD [facility]  04/15/2022, 10:08 AM   *Please note that this is a verbal dictation therefore any spelling or grammatical errors are due to the "Dragon Medical One" system interpretation.  Please page via Amion and do not message via secure chat for urgent patient care matters. Secure chat can be used for non urgent patient care matters.  How to contact the [facility] Attending or Consulting provider 7A -  7P or covering provider during after hours [place], for this patient?  Check the care team in [facility] and look for a) attending/consulting TRH provider listed and b) the [facility] team listed. Page or secure chat 7A-7P. Log into [email or web address] and use [place]'s universal password to access. If you do not have the password, please contact the hospital operator. Locate the [facility] provider you are looking for under [facility] and page to a number that you can be directly reached. If you still have difficulty reaching the provider, please page the [facility] (Director on Call) for the Hospitalists listed on amion for assistance.

## 2022-04-15 NOTE — Progress Notes (Signed)
Pt has tele sitter only

## 2022-04-15 NOTE — Plan of Care (Signed)

## 2022-04-16 LAB — BASIC METABOLIC PANEL
Anion gap: 11 (ref 5–15)
BUN: 10 mg/dL (ref 6–20)
CO2: 23 mmol/L (ref 22–32)
Calcium: 8.9 mg/dL (ref 8.9–10.3)
Chloride: 107 mmol/L (ref 98–111)
Creatinine, Ser: 1.13 mg/dL — ABNORMAL HIGH (ref 0.44–1.00)
GFR, Estimated: 58 mL/min — ABNORMAL LOW (ref >60)
Glucose, Bld: 146 mg/dL — ABNORMAL HIGH (ref 70–99)
Potassium: 3.8 mmol/L (ref 3.5–5.1)
Sodium: 141 mmol/L (ref 135–145)

## 2022-04-16 NOTE — Progress Notes (Signed)
There are no updates regarding placement at this time.  Sherrilynn Gudgel, MSW, LCSW Transitions of Care  Clinical Social Worker II [phone]  

## 2022-04-16 NOTE — Progress Notes (Signed)
PROGRESS NOTE    Natalie Brown  GYI:[id number] DOB: [birth year] DOA: 12/24/2021 PCP: Lavinia Sharps, NP   Brief Narrative:  56 y.o.f w/ history of bipolar disorder, HTN hospitalized at [facility], brought into the hospital due to persistent altered mental status/tachycardia for admission to [facility] service.  Major events: 09/07/2021 admitted to behavioral health.  Treated with Zyprexa Invega and Ativan challenge for catatonia. 12/24/2021 sent to ER from [facility] for evaluation for tachycardia 9/04-9/5, 9/5>9/9, LTM EEG  no seizure. TSH stable CTA chest no PE no pneumonia RPR nonreactive bilateral LE Doppler negative for DVT Echo with normal EF  9/23 febrile secondary to Covid 19 + no pneumonia. 9/25 repeat CT brain unremarkable. She has had prolonged hospitalization seen by psychiatry.Per psychiatry does not meet criteria to go back to behavioral health, awaiting for placement to memory care unit.   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 Mental status is unchanged over the last several days to week> wandering in the room and eating what ever she gets/choking so placed on one-to-one 12/21. Felt it is 2/2 undiagnosed dementia with significant cerebral/cerebellar atrophy as per imagings and it appears to be progressive.  Seen by neurology here. Recommend outpatient follow-up.  No evidence of seizures.  Delirium precaution, fall precaution and continue supportive care.   Bipolar  disorder: Seen by psychiatry, mood stable continue Haldol po. recommended outpatient follow-up with psychiatry.   COVID infection:asymptomatic now, stable. On RA/afebrile. She had elevated D-dimer.CTA chest/lower extremity Doppler negative for VTE.   AKI on CKD stage IIIa: Baseline creatinine 1.3.  Currently kidney function at baseline.  Vitamin B12 deficiency: Resolved last B12 1195 on 12/11, discontinued 6 6 B12 supplement   Sinus tachycardia: HR stable.Thought to be secondary to dehydration.  cont propranolol for hypotension/bradycardia   Tobacco use: cont nicotine patch   Disposition: Prolonged hospitalization pending placement.  TOC following  Hyperkalemia: Was 5.2 yesterday.  Rechecking BMP today.  DVT prophylaxis: enoxaparin (LOVENOX) injection 40 mg Start: 12/26/21 1200   Code Status: Full Code  Family Communication:  None present at bedside.   Status is: Inpatient Remains inpatient appropriate because: Pending placement to memory care unit, medically stable.   Estimated body mass index is 25.62 kg/m as calculated from the following:   Height as of this encounter: 5\' 6"  (1.676 m).   Weight as of this encounter: 72 kg.    Nutritional Assessment: Body mass index is 25.62 kg/m. Seen by dietician.  I agree with the assessment and plan as outlined below: Nutrition Status:        . Skin Assessment: I have examined the patient's skin and I agree with the wound assessment as performed by the wound care RN as outlined below:    Consultants:  Psychiatry-signed off Neurology-signed off  Procedures:  None  Antimicrobials:  Anti-infectives (From admission, onward)    None         Subjective: Seen and examined.  Nonverbal.  Status quo.  Objective: Vitals:   04/15/22 0530 04/15/22 1607  04/16/22 0613 04/16/22 0744  BP: 111/70 121/75 107/74 (!) 141/73  Pulse: (!) 107 95 90 (!) 123  Resp: 17 18 18    Temp: 98.4 F (36.9 C)  98 F (36.7 C)   TempSrc: Oral      SpO2: 100%   99%  Weight:      Height:       No intake or output data in the 24 hours ending 04/16/22 0919  Filed Weights   02/03/22 2025  Weight: 72 kg    Examination:  General exam: Appears calm and comfortable  Respiratory system: Clear to auscultation. Respiratory effort normal. Cardiovascular system: S1 & S2 heard, RRR. No JVD, murmurs, rubs, gallops or clicks. No pedal edema. Gastrointestinal system: Abdomen is nondistended, soft and nontender. No organomegaly or masses felt. Normal bowel sounds heard. Central nervous system: Alert but not oriented.  Data Reviewed: I have personally reviewed following labs and imaging studies  CBC: Recent Labs  Lab 04/15/22 0233  WBC 4.7  HGB 14.4  HCT 44.1  MCV 89.1  PLT 257    Basic Metabolic Panel: Recent Labs  Lab 04/15/22 0233  NA 141  K 5.2*  CL 107  CO2 24  GLUCOSE 84  BUN 11  CREATININE 1.18*  CALCIUM 9.1  MG 2.3    GFR: Estimated Creatinine Clearance: 55.4 mL/min (A) (by C-G formula based on SCr of 1.18 mg/dL (H)). Liver Function Tests: Recent Labs  Lab 04/15/22 0233  AST 39  ALT 28  ALKPHOS 48  BILITOT 1.3*  PROT 6.2*  ALBUMIN 3.3*    No results for input(s): "LIPASE", "AMYLASE" in the last 168 hours. No results for input(s): "AMMONIA" in the last 168 hours. Coagulation Profile: No results for input(s): "INR", "PROTIME" in the last 168 hours. Cardiac Enzymes: No results for input(s): "CKTOTAL", "CKMB", "CKMBINDEX", "TROPONINI" in the last 168 hours. BNP (last 3 results) No results for input(s): "PROBNP" in the last 8760 hours. HbA1C: No results for input(s): "HGBA1C" in the last 72 hours. CBG: No results for input(s): "GLUCAP" in the last 168 hours. Lipid Profile: No results for input(s): "CHOL", "HDL", "LDLCALC", "TRIG", "CHOLHDL", "LDLDIRECT" in the last 72 hours. Thyroid Function Tests: No results for input(s): "TSH", "T4TOTAL", "FREET4", "T3FREE", "THYROIDAB" in the last 72  hours. Anemia Panel: No results for input(s): "VITAMINB12", "FOLATE", "FERRITIN", "TIBC", "IRON", "RETICCTPCT" in the last 72 hours. Sepsis Labs: No results for input(s): "PROCALCITON", "LATICACIDVEN" in the last 168 hours.  No results found for this or any previous visit (from the past 240 hour(s)).   Radiology Studies: No results found.  Scheduled Meds:  (feeding supplement) PROSource Plus  30 mL Oral BID BM   enoxaparin (LOVENOX) injection  40 mg Subcutaneous Q24H   feeding supplement (KATE FARMS STANDARD 1.4)  325 mL Oral BID BM   haloperidol  1 mg Oral BID   hydrocerin   Topical BID   LORazepam  2 mg Oral Once   scopolamine  1 patch Transdermal Q72H   Continuous Infusions:   LOS: 111 days   04/17/22, MD [facility]  04/16/2022, 9:19 AM   *Please note that this is a verbal dictation therefore any spelling or grammatical errors are due to the "[employer]" system interpretation.  Please page via Amion and do not message via secure chat for urgent patient care matters. Secure chat can be used for non urgent patient care matters.  How to contact the [facility] Attending or Consulting provider 7A - 7P or  covering provider during after hours [place], for this patient?  Check the care team in [facility] and look for a) attending/consulting TRH provider listed and b) the [facility] team listed. Page or secure chat 7A-7P. Log into [email or web address] and use [place]'s universal password to access. If you do not have the password, please contact the hospital operator. Locate the [facility] provider you are looking for under [facility] and page to a number that you can be directly reached. If you still have difficulty reaching the provider, please page the [facility] (Director on Call) for the Hospitalists listed on amion for assistance.

## 2022-04-17 MED ORDER — LORAZEPAM 2 MG/ML IJ SOLN
1.0000 mg | Freq: Once | INTRAMUSCULAR | Status: DC
  Filled 2022-04-17: qty 1

## 2022-04-17 MED ORDER — LORAZEPAM 2 MG/ML IJ SOLN: 1.0000 mg | Freq: Once | INTRAMUSCULAR | Status: DC

## 2022-04-17 NOTE — Progress Notes (Signed)
Mobility Specialist Progress Note:   04/17/22 1103  Mobility  Activity Ambulated independently in hallway  Level of Assistance Independent  Assistive Device None  Distance Ambulated (ft) 300 ft  Activity Response Tolerated well  Mobility Referral Yes  $Mobility charge 1 Mobility   Pt received ambulating in hallway and agreeable to redirection back to room. Asymptomatic throughout. Pt left in bed with all needs met, call bell in reach, and bed alarm on.   Andrey Campanile Mobility Specialist Please contact via SecureChat or  Rehab office at [phone]

## 2022-04-17 NOTE — Progress Notes (Signed)
PROGRESS NOTE    Natalie Brown  KHT:[id number] DOB: April 04, 1966 DOA: 12/24/2021 PCP: Lavinia Sharps, NP   Brief Narrative:  56 y.o.f w/ history of bipolar disorder, HTN hospitalized at [facility], brought into the hospital due to persistent altered mental status/tachycardia for admission to [facility] service.  Major events: 09/07/2021 admitted to behavioral health.  Treated with Zyprexa Invega and Ativan challenge for catatonia. 12/24/2021 sent to ER from [facility] for evaluation for tachycardia 9/04-9/5, 9/5>9/9, LTM EEG  no seizure. TSH stable CTA chest no PE no pneumonia RPR nonreactive bilateral LE Doppler negative for DVT Echo with normal EF  9/23 febrile secondary to Covid 19 + no pneumonia. 9/25 repeat CT brain unremarkable. She has had prolonged hospitalization seen by psychiatry.Per psychiatry does not meet criteria to go back to behavioral health, awaiting for placement to memory care unit.   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 Mental status is unchanged over the last several days to week> wandering in the room and eating what ever she gets/choking so placed on one-to-one 12/21. Felt it is 2/2 undiagnosed dementia with significant cerebral/cerebellar atrophy as per imagings and it appears to be progressive.  Seen by neurology here. Recommend outpatient follow-up.  No evidence of seizures.  Delirium precaution, fall precaution and continue supportive care.   Bipolar  disorder: Seen by psychiatry, mood stable continue Haldol po. recommended outpatient follow-up with psychiatry.   COVID infection:asymptomatic now, stable. On RA/afebrile. She had elevated D-dimer.CTA chest/lower extremity Doppler negative for VTE.   AKI on CKD stage IIIa: Baseline creatinine 1.3.  Currently kidney function at baseline.  Vitamin B12 deficiency: Resolved last B12 1195 on 12/11, discontinued 6 6 B12 supplement   Sinus tachycardia: HR stable.Thought to be secondary to dehydration.  cont propranolol for hypotension/bradycardia   Tobacco use: cont nicotine patch   Disposition: Prolonged hospitalization pending placement.  TOC following  Hyperkalemia: Was 5.2 yesterday.  Rechecking BMP today.  DVT prophylaxis: enoxaparin (LOVENOX) injection 40 mg Start: 12/26/21 1200   Code Status: Full Code  Family Communication:  None present at bedside.   Status is: Inpatient Remains inpatient appropriate because: Pending placement to memory care unit, medically stable.   Estimated body mass index is 25.62 kg/m as calculated from the following:   Height as of this encounter: 5\' 6"  (1.676 m).   Weight as of this encounter: 72 kg.    Nutritional Assessment: Body mass index is 25.62 kg/m. Seen by dietician.  I agree with the assessment and plan as outlined below: Nutrition Status:        . Skin Assessment: I have examined the patient's skin and I agree with the wound assessment as performed by the wound care RN as outlined below:    Consultants:  Psychiatry-signed off Neurology-signed off  Procedures:  None  Antimicrobials:  Anti-infectives (From admission, onward)    None         Subjective:  Patient in and examined.  Status quo and nonverbal.  Objective: Vitals:   04/16/22 1545  04/16/22 2136 04/17/22 0320 04/17/22 0902  BP: 114/78 97/76 111/71 91/67  Pulse: (!) 115 79 91 67  Resp: 18 17 17 18   Temp: 98.1 F (36.7 C) 98 F (36.7 C) 98.5 F (36.9 C)  (!) 97.5 F (36.4 C)  TempSrc: Oral Oral Oral Oral  SpO2: 100% 91% 100% (!) 88%  Weight:      Height:        Intake/Output Summary (Last 24 hours) at 04/17/2022 0940 Last data filed at 04/17/2022 0500 Gross per 24 hour  Intake --  Output 1 ml  Net -1 ml    Filed Weights   02/03/22 2025  Weight: 72 kg    Examination:  General exam: Appears calm and comfortable  Respiratory system: Clear to auscultation. Respiratory effort normal. Cardiovascular system: S1 & S2 heard, RRR. No JVD, murmurs, rubs, gallops or clicks. No pedal edema. Gastrointestinal system: Abdomen is nondistended, soft and nontender. No organomegaly or masses felt. Normal bowel sounds heard. Central nervous system: Alert but not oriented.  Data Reviewed: I have personally reviewed following labs and imaging studies  CBC: Recent Labs  Lab 04/15/22 0233  WBC 4.7  HGB 14.4  HCT 44.1  MCV 89.1  PLT 99991111    Basic Metabolic Panel: Recent Labs  Lab 04/15/22 0233 04/16/22 0920  NA 141 141  K 5.2* 3.8  CL 107 107  CO2 24 23  GLUCOSE 84 146*  BUN 11 10  CREATININE 1.18* 1.13*  CALCIUM 9.1 8.9  MG 2.3  --     GFR: Estimated Creatinine Clearance: 57.9 mL/min (A) (by C-G formula based on SCr of 1.13 mg/dL (H)). Liver Function Tests: Recent Labs  Lab 04/15/22 0233  AST 39  ALT 28  ALKPHOS 48  BILITOT 1.3*  PROT 6.2*  ALBUMIN 3.3*    No results for input(s): "LIPASE", "AMYLASE" in the last 168 hours. No results for input(s): "AMMONIA" in the last 168 hours. Coagulation Profile: No results for input(s): "INR", "PROTIME" in the last 168 hours. Cardiac Enzymes: No results for input(s): "CKTOTAL", "CKMB", "CKMBINDEX", "TROPONINI" in the last 168 hours. BNP (last 3 results) No results for input(s): "PROBNP" in the last 8760 hours. HbA1C: No results for input(s): "HGBA1C" in the last 72 hours. CBG: No results for input(s): "GLUCAP" in the last 168 hours. Lipid Profile: No results for  input(s): "CHOL", "HDL", "LDLCALC", "TRIG", "CHOLHDL", "LDLDIRECT" in the last 72 hours. Thyroid Function Tests: No results for input(s): "TSH", "T4TOTAL", "FREET4", "T3FREE", "THYROIDAB" in the last 72 hours. Anemia Panel: No results for input(s): "VITAMINB12", "FOLATE", "FERRITIN", "TIBC", "IRON", "RETICCTPCT" in the last 72 hours. Sepsis Labs: No results for input(s): "PROCALCITON", "LATICACIDVEN" in the last 168 hours.  No results found for this or any previous visit (from the past 240 hour(s)).   Radiology Studies: No results found.  Scheduled Meds:  (feeding supplement) PROSource Plus  30 mL Oral BID BM   enoxaparin (LOVENOX) injection  40 mg Subcutaneous Q24H   feeding supplement (KATE FARMS STANDARD 1.4)  325 mL Oral BID BM   haloperidol  1 mg Oral BID   hydrocerin   Topical BID   LORazepam  1 mg Intramuscular Once   LORazepam  2 mg Oral Once   scopolamine  1 patch Transdermal Q72H   Continuous Infusions:   LOS: 112 days   Darliss Cheney, MD [facility]  04/17/2022, 9:40 AM   *Please note that this is a verbal dictation therefore any spelling or grammatical errors are due  to the [employer] One" system interpretation.  Please page via [place] and do not message via secure chat for urgent patient care matters. Secure chat can be used for non urgent patient care matters.  How to contact the [facility] Attending or Consulting provider [place] or covering provider during after hours [place], for this patient?  Check the care team in [facility] and look for a) attending/consulting TRH provider listed and b) the [facility] team listed. Page or secure chat 7A-7P. Log into [email or web address] and use [place]'s universal password to access. If you do not have the password, please contact the hospital operator. Locate the [facility] provider you are looking for under [facility] and page to a number that you can be directly reached. If you still have difficulty reaching the provider, please page  the [facility] (Director on Call) for the Hospitalists listed on amion for assistance.

## 2022-04-17 NOTE — Progress Notes (Signed)
Mobility Specialist Progress Note:   04/17/22 1027  Mobility  Activity Ambulated independently in room  Level of Assistance Independent  Assistive Device None  Distance Ambulated (ft) 50 ft  Activity Response Tolerated well  Mobility Referral Yes  $Mobility charge 1 Mobility   Pt received in room with bed alarm going off and patient ambulating towards door. Pt redirected back into room. Asymptomatic throughout. Pt left in bed with all needs met and call bell in reach.   Andrey Campanile Mobility Specialist Please contact via SecureChat or  Rehab office at [phone]

## 2022-04-18 NOTE — Progress Notes (Signed)
Mobility Specialist Progress Note:   04/18/22 1002  Mobility  Activity Ambulated independently in hallway  Level of Assistance Independent  Assistive Device None  Distance Ambulated (ft) 500 ft  Activity Response Tolerated well  Mobility Referral Yes  $Mobility charge 1 Mobility   Pt received in bed and agreeable. Asymptomatic throughout. While brushing teeth at sink, pt urinated on floor. Pt left ambulating in room with RN and NT in room.   Daine Gravel Mobility Specialist Please contact via SecureChat or  Rehab office at [phone]

## 2022-04-18 NOTE — Progress Notes (Signed)
PROGRESS NOTE    Natalie Brown  PIR:[id number] DOB: December 17, 1965 DOA: 12/24/2021 PCP: Lavinia Sharps, NP   Brief Narrative:  56 y.o.f w/ history of bipolar disorder, HTN hospitalized at [facility], brought into the hospital due to persistent altered mental status/tachycardia for admission to [facility] service.  Major events: 09/07/2021 admitted to behavioral health.  Treated with Zyprexa Invega and Ativan challenge for catatonia. 12/24/2021 sent to ER from [facility] for evaluation for tachycardia 9/04-9/5, 9/5>9/9, LTM EEG  no seizure. TSH stable CTA chest no PE no pneumonia RPR nonreactive bilateral LE Doppler negative for DVT Echo with normal EF  9/23 febrile secondary to Covid 19 + no pneumonia. 9/25 repeat CT brain unremarkable. She has had prolonged hospitalization seen by psychiatry.Per psychiatry does not meet criteria to go back to behavioral health, awaiting for placement to memory care unit.   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 Mental status is unchanged over the last several days to week> wandering in the room and eating what ever she gets/choking so placed on one-to-one 12/21. Felt it is 2/2 undiagnosed dementia with significant cerebral/cerebellar atrophy as per imagings and it appears to be progressive.  Seen by neurology here. Recommend outpatient follow-up.  No evidence of seizures.  Delirium precaution, fall precaution and continue supportive care.   Bipolar  disorder: Seen by psychiatry, mood stable continue Haldol po. recommended outpatient follow-up with psychiatry.   COVID infection:asymptomatic now, stable. On RA/afebrile. She had elevated D-dimer.CTA chest/lower extremity Doppler negative for VTE.   AKI on CKD stage IIIa: Baseline creatinine 1.3.  Currently kidney function at baseline.  Vitamin B12 deficiency: Resolved last B12 1195 on 12/11, discontinued 6 6 B12 supplement   Sinus tachycardia: HR stable.Thought to be secondary to dehydration.  cont propranolol for hypotension/bradycardia   Tobacco use: cont nicotine patch   Disposition: Prolonged hospitalization pending placement.  TOC following  Hyperkalemia: Resolved.  DVT prophylaxis: enoxaparin (LOVENOX) injection 40 mg Start: 12/26/21 1200   Code Status: Full Code  Family Communication:  None present at bedside.   Status is: Inpatient Remains inpatient appropriate because: Pending placement to memory care unit, medically stable.   Estimated body mass index is 25.62 kg/m as calculated from the following:   Height as of this encounter: 5\' 6"  (1.676 m).   Weight as of this encounter: 72 kg.    Nutritional Assessment: Body mass index is 25.62 kg/m. Seen by dietician.  I agree with the assessment and plan as outlined below: Nutrition Status:        . Skin Assessment: I have examined the patient's skin and I agree with the wound assessment as performed by the wound care RN as outlined below:    Consultants:  Psychiatry-signed off Neurology-signed off  Procedures:  None  Antimicrobials:  Anti-infectives (From admission, onward)    None         Subjective:  Seen and examined.  Nonverbal.  Status quo.  Objective: Vitals:   04/17/22 04/19/22 04/17/22 1610 04/18/22 0532 04/18/22 04/20/22  BP: 91/67 94/61 102/82 (!) 118/102  Pulse: 67 91 84 (!) 127  Resp: 18 18 17    Temp: (!) 97.5 F (36.4 C) (!) 97.4 F (36.3 C) 98 F (36.7 C)   TempSrc: Oral Oral     SpO2: (!) 88% 100%    Weight:      Height:        Intake/Output Summary (Last 24 hours) at 04/18/2022 0850 Last data filed at 04/18/2022 0130 Gross per 24 hour  Intake 240 ml  Output --  Net 240 ml    Filed Weights   02/03/22 2025  Weight: 72 kg    Examination:  General exam: Appears calm and comfortable  Respiratory system: Clear to auscultation. Respiratory effort normal. Cardiovascular system: S1 & S2 heard, RRR. No JVD, murmurs, rubs, gallops or clicks. No pedal edema. Gastrointestinal system: Abdomen is nondistended, soft and nontender. No organomegaly or masses felt. Normal bowel sounds heard. Central nervous system: Alert but not oriented.  Data Reviewed: I have personally reviewed following labs and imaging studies  CBC: Recent Labs  Lab 04/15/22 0233  WBC 4.7  HGB 14.4  HCT 44.1  MCV 89.1  PLT 257    Basic Metabolic Panel: Recent Labs  Lab 04/15/22 0233 04/16/22 0920  NA 141 141  K 5.2* 3.8  CL 107 107  CO2 24 23  GLUCOSE 84 146*  BUN 11 10  CREATININE 1.18* 1.13*  CALCIUM 9.1 8.9  MG 2.3  --     GFR: Estimated Creatinine Clearance: 57.9 mL/min (A) (by C-G formula based on SCr of 1.13 mg/dL (H)). Liver Function Tests: Recent Labs  Lab 04/15/22 0233  AST 39  ALT 28  ALKPHOS 48  BILITOT 1.3*  PROT 6.2*  ALBUMIN 3.3*    No results for input(s): "LIPASE", "AMYLASE" in the last 168 hours. No results for input(s): "AMMONIA" in the last 168 hours. Coagulation Profile: No results for input(s): "INR", "PROTIME" in the last 168 hours. Cardiac Enzymes: No results for input(s): "CKTOTAL", "CKMB", "CKMBINDEX", "TROPONINI" in the last 168 hours. BNP (last 3 results) No results for input(s): "PROBNP" in the last 8760 hours. HbA1C: No results for input(s): "HGBA1C" in the last 72 hours. CBG: No results for input(s): "GLUCAP" in the last 168 hours. Lipid Profile: No results for input(s): "CHOL", "HDL", "LDLCALC", "TRIG", "CHOLHDL",  "LDLDIRECT" in the last 72 hours. Thyroid Function Tests: No results for input(s): "TSH", "T4TOTAL", "FREET4", "T3FREE", "THYROIDAB" in the last 72 hours. Anemia Panel: No results for input(s): "VITAMINB12", "FOLATE", "FERRITIN", "TIBC", "IRON", "RETICCTPCT" in the last 72 hours. Sepsis Labs: No results for input(s): "PROCALCITON", "LATICACIDVEN" in the last 168 hours.  No results found for this or any previous visit (from the past 240 hour(s)).   Radiology Studies: No results found.  Scheduled Meds:  (feeding supplement) PROSource Plus  30 mL Oral BID BM   enoxaparin (LOVENOX) injection  40 mg Subcutaneous Q24H   feeding supplement (KATE FARMS STANDARD 1.4)  325 mL Oral BID BM   haloperidol  1 mg Oral BID   hydrocerin   Topical BID   LORazepam  1 mg Intramuscular Once   LORazepam  2 mg Oral Once   scopolamine  1 patch Transdermal Q72H   Continuous Infusions:   LOS: 113 days   04/17/22, MD [facility]  04/18/2022, 8:50 AM   *Please note that this is a verbal dictation therefore any spelling or grammatical errors are due to the "Dragon Medical One" system interpretation.  Please page via Amion and do not message via secure chat for urgent patient care matters. Secure chat can be used for non urgent patient care matters.  How to contact the [name] Attending or Consulting provider 7A - 7P or covering provider during after hours 7P -7A, for this patient?  Check the care team in [facility] and look for a) attending/consulting [facility] provider listed and b) the [facility] team listed. Page or secure chat 7A-7P. Log into [email or web address] and use [place]'s universal password to access. If you do not have the password, please contact the hospital operator. Locate the [facility] provider you are looking for under [facility] and page to a number that you can be directly reached. If you still have difficulty reaching the provider, please page the [facility] (Director on Call) for the Hospitalists listed  on amion for assistance.

## 2022-04-19 NOTE — Progress Notes (Signed)
Mobility Specialist Progress Note:   04/19/22 1705  Mobility  Activity Ambulated independently in hallway  Level of Assistance Independent  Assistive Device None  Distance Ambulated (ft) 700 ft  Activity Response Tolerated well;RN notified  Mobility Referral Yes  $Mobility charge 1 Mobility   Pt received multiple times ambulating in the hallway and asking staff for scissors to cut up her blankets. Pt redirected each time back to room. Pt left each time with all needs met, call bell in reach, and bed alarm on.   Andrey Campanile Mobility Specialist Please contact via SecureChat or  Rehab office at [phone]

## 2022-04-19 NOTE — Progress Notes (Signed)
Mobility Specialist Progress Note:   04/19/22 1640  Mobility  Activity Ambulated independently in hallway  Level of Assistance Independent  Assistive Device None  Distance Ambulated (ft) 300 ft  Activity Response Tolerated well  Mobility Referral Yes  $Mobility charge 1 Mobility   Pt received ambulating in hallway and requesting scissors from RN staff. Pt redirected back to room. Asymptomatic throughout. Pt left in bed with all needs met, call bell in reach, and bed alarm on.   Andrey Campanile Mobility Specialist Please contact via SecureChat or  Rehab office at [phone]

## 2022-04-19 NOTE — Progress Notes (Signed)
PROGRESS NOTE    Natalie Brown  YCX:[id number] DOB: 1965-08-08 DOA: 12/24/2021 PCP: Lavinia Sharps, NP   Brief Narrative:  56 y.o.f w/ history of bipolar disorder, HTN hospitalized at [facility], brought into the hospital due to persistent altered mental status/tachycardia for admission to [facility] service.  Major events: 09/07/2021 admitted to behavioral health.  Treated with Zyprexa Invega and Ativan challenge for catatonia. 12/24/2021 sent to ER from [facility] for evaluation for tachycardia 9/04-9/5, 9/5>9/9, LTM EEG  no seizure. TSH stable CTA chest no PE no pneumonia RPR nonreactive bilateral LE Doppler negative for DVT Echo with normal EF  9/23 febrile secondary to Covid 19 + no pneumonia. 9/25 repeat CT brain unremarkable. She has had prolonged hospitalization seen by psychiatry.Per psychiatry does not meet criteria to go back to behavioral health, awaiting for placement to memory care unit.  [name] and placed on TELE Sitter.  Subjective: Seen and examined.  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 She started eating what ever she gets/choking so placed on one-to-one 04/11/22. Mental status felt to be baseline now and is 2/2 undiagnosed dementia with significant cerebral/cerebellar atrophy as per imagings  and it appears to be progressive.  Seen by neurology here.Recommend outpatient follow-up.  No evidence of seizures.  Delirium precaution, fall precaution and continue supportive care.   Bipolar disorder: Seen by psychiatry.  Currently mood stable.  Continue current medication and needs outpatient follow-up with psychiatry.   COVID infection:asymptomatic now, stable. On RA/afebrile. She had elevated D-dimer.CTA chest/lower extremity Doppler negative for VTE.   AKI on CKD stage IIIa: Baseline creatinine 1.3.  Stable.  Checking labs weekly  Vitamin B12 deficiency: Resolved last B12 1195 on 12/11, discontinued B12 supplement   Sinus tachycardia: HR stable.Thought to be secondary to dehydration.  cont propranolol for hypotension/bradycardia   Tobacco use: cont nicotine patch   Disposition: Prolonged hospitalization pending placement.  TOC following  Hyperkalemia: Resolved.  DVT prophylaxis: enoxaparin (LOVENOX) injection 40 mg Start: 12/26/21 1200   Code Status: Full Code  Family Communication:  None present at bedside.   Status is: Inpatient Remains inpatient appropriate because: Pending placement to memory care unit, medically stable.  Consultants:  Psychiatry-signed off Neurology-signed off  Procedures:  None  Antimicrobials:  Anti-infectives (From admission, onward)    None       Objective: Vitals:   04/18/22 0832 04/18/22 1300 04/19/22 0529 04/19/22 0550  BP: (!) 118/102 110/68 (!) 134/102 107/62  Pulse: (!) 127 86 64   Resp:  18 17   Temp:   97.9 F (36.6 C)   TempSrc:   Oral   SpO2:      Weight:      Height:      Examination: General exam: AA, but not oriented , not communicative,weak,older appearing HEENT:Oral mucosa moist, Ear/Nose WNL grossly, dentition normal. Respiratory system: bilaterally clear BS,no use of accessory  muscle Cardiovascular system: S1 & S2 +, regular rate, JVD neg. Gastrointestinal system: Abdomen soft,NT,ND,BS+ Nervous System:Alert,  awake, moving extremities and grossly nonfocal Extremities: LE ankle edema neg, lower extremities warm Skin: No rashes,no icterus. MSK: Normal muscle bulk,tone, power   Data Reviewed: I have personally reviewed following labs and imaging studies  CBC: Recent Labs  Lab 04/15/22 0233  WBC 4.7  HGB 14.4  HCT 44.1  MCV 89.1  PLT 257    Basic Metabolic Panel: Recent Labs  Lab 04/15/22 0233 04/16/22 0920  NA 141 141  K 5.2* 3.8  CL 107 107  CO2 24 23  GLUCOSE 84 146*  BUN 11 10  CREATININE 1.18* 1.13*  CALCIUM 9.1 8.9  MG 2.3  --     GFR: Estimated Creatinine Clearance: 57.9 mL/min (A) (by C-G formula based on SCr of 1.13 mg/dL (H)). Liver Function Tests: Recent Labs  Lab 04/15/22 0233  AST 39  ALT 28  ALKPHOS 48  BILITOT 1.3*  PROT 6.2*  ALBUMIN 3.3*   Radiology Studies: No results found. Scheduled Meds:  (feeding supplement) PROSource Plus  30 mL Oral BID BM   enoxaparin (LOVENOX) injection  40 mg Subcutaneous Q24H   feeding supplement (KATE FARMS STANDARD 1.4)  325 mL Oral BID BM   haloperidol  1 mg Oral BID   hydrocerin   Topical BID   LORazepam  1 mg Intramuscular Once   LORazepam  2 mg Oral Once   scopolamine  1 patch Transdermal Q72H   Continuous Infusions:   LOS: 114 days   Lanae Boast, MD [facility]  04/19/2022, 8:19 AM

## 2022-04-19 NOTE — Plan of Care (Signed)
  Problem: Activity: Goal: Risk for activity intolerance will decrease Outcome: Progressing   Problem: Nutrition: Goal: Adequate nutrition will be maintained Outcome: Progressing   Problem: Safety: Goal: Ability to remain free from injury will improve Outcome: Progressing   Problem: Skin Integrity: Goal: Risk for impaired skin integrity will decrease Outcome: Progressing   

## 2022-04-19 NOTE — Progress Notes (Signed)
Mobility Specialist Progress Note:   04/19/22 1220  Mobility  Activity Ambulated independently in hallway  Level of Assistance Independent  Assistive Device None  Distance Ambulated (ft) 500 ft  Activity Response Tolerated well  Mobility Referral Yes  $Mobility charge 1 Mobility   Pt received in bed and agreeable. Asymptomatic throughout. Pt left in bed with all needs met, call bell in reach, and bed alarm on.   Andrey Campanile Mobility Specialist Please contact via SecureChat or  Rehab office at [phone]

## 2022-04-20 NOTE — Progress Notes (Signed)
Mobility Specialist Progress Note:   04/20/22 1225  Mobility  Activity Ambulated independently in hallway  Level of Assistance Independent  Assistive Device None  Distance Ambulated (ft) 300 ft  Activity Response Tolerated well  Mobility Referral Yes  $Mobility charge 1 Mobility   Pt received ambulating in hallway and agreeable to redirection back to room. Asymptomatic throughout. Pt left in bed with all needs met and call bell in reach.   Andrey Campanile Mobility Specialist Please contact via SecureChat or  Rehab office at [phone]

## 2022-04-20 NOTE — Progress Notes (Signed)
PROGRESS NOTE    Natalie Brown  JJK:[id number] DOB: [birth year] DOA: 12/24/2021 PCP: Natalie Sharps, NP   Brief Narrative:  56 y.o.f w/ history of bipolar disorder, HTN hospitalized at [facility], brought into the hospital due to persistent altered mental status/tachycardia for admission to [facility] service.  Major events: 09/07/2021 admitted to behavioral health.  Treated with Zyprexa Invega and Ativan challenge for catatonia. 12/24/2021 sent to ER from [facility] for evaluation for tachycardia 9/04-9/5, 9/5>9/9, LTM EEG  no seizure. TSH stable CTA chest no PE no pneumonia RPR nonreactive bilateral LE Doppler negative for DVT Echo with normal EF  9/23 febrile secondary to Covid 19 + no pneumonia. 9/25 repeat CT brain unremarkable. She has had prolonged hospitalization seen by psychiatry.Per psychiatry does not meet criteria to go back to behavioral health, awaiting for placement to memory care unit.  [name] and placed on TELE Sitter.  Subjective: Seen and examined this morning she is alert awake as usual does not communicate or interact. 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 She started eating what ever she gets/choking so placed on one-to-one 04/11/22. Mental status felt to be baseline now and is 2/2 undiagnosed dementia with significant  cerebral/cerebellar atrophy as per imagings and it appears to be progressive.  Seen by neurology here.Recommend outpatient follow-up.  No evidence of seizures.  Delirium precaution, fall precaution and continue supportive care.   Bipolar disorder: Seen by psychiatry.  Currently mood stable.  Continue current medication and needs outpatient follow-up with psychiatry.   COVID infection:asymptomatic now, stable. On RA/afebrile. She had elevated D-dimer.CTA chest/lower extremity Doppler negative for VTE.   AKI on CKD stage IIIa: Baseline creatinine 1.3.  Stable.  Checking labs weekly  Vitamin B12 deficiency: Resolved last B12 1195 on 12/11, discontinued B12 supplement   Sinus tachycardia: HR stable.Thought to be secondary to dehydration.  cont propranolol for hypotension/bradycardia   Tobacco use: cont nicotine patch   Disposition: Prolonged hospitalization pending placement.  TOC following  Hyperkalemia: Resolved.  DVT prophylaxis: enoxaparin (LOVENOX) injection 40 mg Start: 12/26/21 1200   Code Status: Full Code  Family Communication:  None present at bedside.   Status is: Inpatient Remains inpatient appropriate because: Pending placement to memory care unit, medically stable.  Consultants:  Psychiatry-signed off Neurology-signed off  Procedures:  None  Antimicrobials:  Anti-infectives (From admission, onward)    None       Objective: Vitals:   04/19/22 0550 04/19/22 0920 04/20/22 0034 04/20/22 0455  BP: 107/62 100/68 95/61 (!) 89/58  Pulse:  (!) 109 91 68  Resp:  16 17 18   Temp:  98.7 F (37.1 C) 98.2 F (36.8 C) 98.2 F (36.8 C)  TempSrc:  Oral Oral   SpO2:   100% 100%  Weight:      Height:      Examination: General exam: AA, but not oriented , not communicative,weak,older appearing  HEENT:Oral mucosa moist, Ear/Nose WNL grossly, dentition normal. Respiratory system: bilaterally clear BS,no use of accessory muscle Cardiovascular system: S1 & S2 +, regular rate,  JVD neg. Gastrointestinal system: Abdomen soft,NT,ND,BS+ Nervous System:Alert, awake, moving extremities and grossly nonfocal Extremities: LE ankle edema neg, lower extremities warm Skin: No rashes,no icterus. MSK: Normal muscle bulk,tone, power   Data Reviewed: I have personally reviewed following labs and imaging studies  CBC: Recent Labs  Lab 04/15/22 0233  WBC 4.7  HGB 14.4  HCT 44.1  MCV 89.1  PLT 257    Basic Metabolic Panel: Recent Labs  Lab 04/15/22 0233 04/16/22 0920  NA 141 141  K 5.2* 3.8  CL 107 107  CO2 24 23  GLUCOSE 84 146*  BUN 11 10  CREATININE 1.18* 1.13*  CALCIUM 9.1 8.9  MG 2.3  --     GFR: Estimated Creatinine Clearance: 57.9 mL/min (A) (by C-G formula based on SCr of 1.13 mg/dL (H)). Liver Function Tests: Recent Labs  Lab 04/15/22 0233  AST 39  ALT 28  ALKPHOS 48  BILITOT 1.3*  PROT 6.2*  ALBUMIN 3.3*   Radiology Studies: No results found. Scheduled Meds:  (feeding supplement) PROSource Plus  30 mL Oral BID BM   enoxaparin (LOVENOX) injection  40 mg Subcutaneous Q24H   feeding supplement (KATE FARMS STANDARD 1.4)  325 mL Oral BID BM   haloperidol  1 mg Oral BID   hydrocerin   Topical BID   LORazepam  1 mg Intramuscular Once   LORazepam  2 mg Oral Once   scopolamine  1 patch Transdermal Q72H   Continuous Infusions:   LOS: 115 days   Lanae Boast, MD [facility]  04/20/2022, 6:52 AM

## 2022-04-21 NOTE — Progress Notes (Signed)
Mobility Specialist Progress Note:   04/21/22 1139  Mobility  Activity Ambulated independently in hallway  Level of Assistance Independent  Assistive Device None  Distance Ambulated (ft) 500 ft  Activity Response Tolerated well  Mobility Referral Yes  $Mobility charge 1 Mobility   Pt received in bed and agreeable. Asymptomatic throughout. Pt left sitting EOB to eat lunch with all needs met and call bell in reach.   Andrey Campanile Mobility Specialist Please contact via SecureChat or  Rehab office at [phone]

## 2022-04-21 NOTE — Progress Notes (Signed)
PROGRESS NOTE    Natalie Brown  NWG:[id number] DOB: 12/15/1965 DOA: 12/24/2021 PCP: Lavinia Sharps, NP   Brief Narrative:  56 y.o.f w/ history of bipolar disorder, HTN hospitalized at [facility], brought into the hospital due to persistent altered mental status/tachycardia for admission to [facility] service.  Major events: 09/07/2021 admitted to behavioral health.  Treated with Zyprexa Invega and Ativan challenge for catatonia. 12/24/2021 sent to ER from [facility] for evaluation for tachycardia 9/04-9/5, 9/5>9/9, LTM EEG  no seizure. TSH stable CTA chest no PE no pneumonia RPR nonreactive bilateral LE Doppler negative for DVT Echo with normal EF  9/23 febrile secondary to Covid 19 + no pneumonia. 9/25 repeat CT brain unremarkable. She has had prolonged hospitalization seen by psychiatry.Per psychiatry does not meet criteria to go back to behavioral health, awaiting for placement to memory care unit.  [name] and placed on TELE Sitter.  Subjective: Seen and examined.  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 She started eating what ever she gets/choking so placed on one-to-one 04/11/22. Mental status felt to be baseline now and is 2/2 undiagnosed dementia  with significant cerebral/cerebellar atrophy as per imagings and it appears to be progressive.  Seen by neurology here.Recommend outpatient follow-up.  No evidence of seizures.  Delirium precaution, fall precaution and continue supportive care.   Bipolar disorder: Seen by psychiatry.  Currently mood stable.  Continue current medication and needs outpatient follow-up with psychiatry.   COVID infection:asymptomatic now, stable. On RA/afebrile. She had elevated D-dimer.CTA chest/lower extremity Doppler negative for VTE.   AKI on CKD stage IIIa: Baseline creatinine 1.3.  Stable.  Checking labs weekly  Vitamin B12 deficiency: Resolved last B12 1195 on 12/11, discontinued B12 supplement   Sinus tachycardia: HR stable.Thought to be secondary to dehydration.  cont propranolol for hypotension/bradycardia   Tobacco use: cont nicotine patch   Disposition: Prolonged hospitalization pending placement.  TOC following  Hyperkalemia: Resolved.  DVT prophylaxis: enoxaparin (LOVENOX) injection 40 mg Start: 12/26/21 1200   Code Status: Full Code  Family Communication:  None present at bedside.   Status is: Inpatient Remains inpatient appropriate because: Pending placement to memory care unit, medically stable.  Consultants:  Psychiatry-signed off Neurology-signed off  Procedures:  None  Antimicrobials:  Anti-infectives (From admission, onward)    None       Objective: Vitals:   04/20/22 0728 04/20/22 1620 04/20/22 2025 04/21/22 0847  BP: 104/66 103/61 96/64 105/79  Pulse: 84 84 88 93  Resp: 16 16 17 15   Temp: 98 F (36.7 C) 98.1 F (36.7 C) 98.3 F (36.8 C) 98.4 F (36.9 C)  TempSrc: Oral Oral Oral Oral  SpO2: 100% 97% 100% 100%  Weight:      Height:      Examination: General exam: Alert awake not  communicative interactive  HEENT:Oral mucosa moist, Ear/Nose WNL grossly, dentition normal. Respiratory system: bilaterally clear BS, no use of accessory muscle Cardiovascular system:  S1 & S2 +, regular rate,. Gastrointestinal system: Abdomen soft,NT,ND,BS+ Nervous System:Alert, awake, moving extremities well spontaneously does not follow commands. Extremities: LE ankle edema neg, lower extremities warm Skin: No rashes,no icterus. MSK: Normal muscle bulk,tone, power  Data Reviewed: I have personally reviewed following labs and imaging studies  CBC: Recent Labs  Lab 04/15/22 0233  WBC 4.7  HGB 14.4  HCT 44.1  MCV 89.1  PLT 257    Basic Metabolic Panel: Recent Labs  Lab 04/15/22 0233 04/16/22 0920  NA 141 141  K 5.2* 3.8  CL 107 107  CO2 24 23  GLUCOSE 84 146*  BUN 11 10  CREATININE 1.18* 1.13*  CALCIUM 9.1 8.9  MG 2.3  --     GFR: Estimated Creatinine Clearance: 57.9 mL/min (A) (by C-G formula based on SCr of 1.13 mg/dL (H)). Liver Function Tests: Recent Labs  Lab 04/15/22 0233  AST 39  ALT 28  ALKPHOS 48  BILITOT 1.3*  PROT 6.2*  ALBUMIN 3.3*   Radiology Studies: No results found. Scheduled Meds:  (feeding supplement) PROSource Plus  30 mL Oral BID BM   enoxaparin (LOVENOX) injection  40 mg Subcutaneous Q24H   feeding supplement (KATE FARMS STANDARD 1.4)  325 mL Oral BID BM   haloperidol  1 mg Oral BID   hydrocerin   Topical BID   LORazepam  1 mg Intramuscular Once   LORazepam  2 mg Oral Once   scopolamine  1 patch Transdermal Q72H   Continuous Infusions:   LOS: 116 days   Lanae Boast, MD [facility]  04/21/2022, 9:00 AM

## 2022-04-21 NOTE — Progress Notes (Signed)
Mobility Specialist Progress Note:   04/21/22 1159  Mobility  Activity Ambulated independently in hallway  Level of Assistance Independent  Assistive Device None  Distance Ambulated (ft) 200 ft  Activity Response Tolerated well  Mobility Referral Yes  $Mobility charge 1 Mobility   Pt received ambulating in hallway with food tray and agreeable to redirection back to room. Asymptomatic throughout. Pt left in bed with all needs met and call bell in reach.   Andrey Campanile Mobility Specialist Please contact via SecureChat or  Rehab office at [phone]

## 2022-04-22 LAB — COMPREHENSIVE METABOLIC PANEL
ALT: 22 U/L (ref 0–44)
AST: 21 U/L (ref 15–41)
Albumin: 3.5 g/dL (ref 3.5–5.0)
Alkaline Phosphatase: 51 U/L (ref 38–126)
Anion gap: 6 (ref 5–15)
BUN: 10 mg/dL (ref 6–20)
CO2: 26 mmol/L (ref 22–32)
Calcium: 9 mg/dL (ref 8.9–10.3)
Chloride: 106 mmol/L (ref 98–111)
Creatinine, Ser: 1.07 mg/dL — ABNORMAL HIGH (ref 0.44–1.00)
GFR, Estimated: >60 mL/min (ref >60)
Glucose, Bld: 103 mg/dL — ABNORMAL HIGH (ref 70–99)
Potassium: 4 mmol/L (ref 3.5–5.1)
Sodium: 138 mmol/L (ref 135–145)
Total Bilirubin: 0.3 mg/dL (ref 0.3–1.2)
Total Protein: 6.9 g/dL (ref 6.5–8.1)

## 2022-04-22 LAB — CBC
HCT: 42.9 % (ref 36.0–46.0)
Hemoglobin: 14.1 g/dL (ref 12.0–15.0)
MCH: 29 pg (ref 26.0–34.0)
MCHC: 32.9 g/dL (ref 30.0–36.0)
MCV: 88.3 fL (ref 80.0–100.0)
Platelets: 286 10*3/uL (ref 150–400)
RBC: 4.86 MIL/uL (ref 3.87–5.11)
RDW: 13.2 % (ref 11.5–15.5)
WBC: 4.8 10*3/uL (ref 4.0–10.5)
nRBC: 0 % (ref 0.0–0.2)

## 2022-04-22 LAB — MAGNESIUM: Magnesium: 2.1 mg/dL (ref 1.7–2.4)

## 2022-04-22 NOTE — Progress Notes (Signed)
PROGRESS NOTE    Natalie Brown  KDX:[id number] DOB: [birth year] DOA: 12/24/2021 PCP: Marliss Coots, NP   Brief Narrative:  57 y.o.f w/ history of bipolar disorder, HTN hospitalized at [facility], brought into the hospital due to persistent altered mental status/tachycardia for admission to [facility] service.  Major events: 09/07/2021 admitted to behavioral health.  Treated with Zyprexa Invega and Ativan challenge for catatonia. 12/24/2021 sent to ER from [facility] for evaluation for tachycardia 9/04-9/5, 9/5>9/9, LTM EEG  no seizure. TSH stable CTA chest no PE no pneumonia RPR nonreactive bilateral LE Doppler negative for DVT Echo with normal EF  9/23 febrile secondary to Covid 19 + no pneumonia. 9/25 repeat CT brain unremarkable. She has had prolonged hospitalization seen by psychiatry.Per psychiatry does not meet criteria to go back to behavioral health, awaiting for placement to memory care unit.  [name] and placed on TELE Sitter.  Subjective: Seen and examined Ambulating in the room about to have a female Patient ambulated with mobility yesterday few times  No acute events. Assessment & Plan:   Principal Problem:   Early onset Dementia with behavioral disturbance ([place]) Active Problems:   Delirium due to multiple etiologies, acute, hyperactive   Bipolar affective disorder, current episode manic with psychotic symptoms (HCC)   Sinus tachycardia   Tobacco abuse   Altered mental state   B12 deficiency   COVID-19 virus infection   Drooling   Essential hypertension  Acute encephalopathy Hypoactive delirium: Background dementia: Continues to be confused not interacting not communicative although alert awake, feeding herself, ambulating and is unchanged over past few months. She started eating what ever she gets/choking so placed on one-to-one 04/11/22. Mental status felt to be baseline now and is 2/2 undiagnosed dementia with significant cerebral/cerebellar  atrophy as per imagings and it appears to be progressive.  Seen by neurology here.Recommend outpatient follow-up.  No evidence of seizures.  Delirium precaution, fall precaution and continue supportive care.   Bipolar disorder: Seen by psychiatry.  Currently mood stable.  Continue current medication and needs outpatient follow-up with psychiatry.   COVID infection:asymptomatic now, stable. On RA/afebrile. She had elevated D-dimer.CTA chest/lower extremity Doppler negative for VTE.   AKI on CKD stage IIIa: Baseline creatinine 1.3.  Stable.  Checking labs weekly  Vitamin B12 deficiency: Resolved last B12 1195 on 12/11, discontinued B12 supplement   Sinus tachycardia: HR stable.Thought to be secondary to dehydration.  cont propranolol for hypotension/bradycardia   Tobacco use: cont nicotine patch   Disposition: Prolonged hospitalization pending placement.  TOC following  Hyperkalemia: Resolved.  DVT prophylaxis: enoxaparin (LOVENOX) injection 40 mg Start: 12/26/21 1200   Code Status: Full Code  Family Communication:  None present at bedside.   Status is: Inpatient Remains inpatient appropriate because: Pending placement to memory care unit, medically stable.  Consultants:  Psychiatry-signed off Neurology-signed off  Procedures:  None  Antimicrobials:  Anti-infectives (From admission, onward)    None       Objective: Vitals:   04/20/22 2025 04/21/22 0847 04/21/22 2002 04/22/22 0500  BP: 96/64 105/79 106/72 (!) 140/99  Pulse: 88 93 83 94  Resp: 17 15 16 17   Temp: 98.3 F (36.8 C) 98.4 F (36.9 C) 97.8 F (36.6 C) 97.9 F (36.6 C)  TempSrc: Oral Oral Oral Oral  SpO2: 100% 100%  97%  Weight:      Height:      Examination: General exam: Alert awake not communicative interactive  HEENT:Oral mucosa moist, Ear/Nose  WNL grossly, dentition normal. Respiratory system: bilaterally clear BS, no use of accessory muscle Cardiovascular system: S1 & S2 +, regular  rate,. Gastrointestinal system: Abdomen soft,NT,ND,BS+ Nervous System:Alert, awake, moving extremities well spontaneously does not follow commands. Extremities: LE ankle edema neg, lower extremities warm Skin: No rashes,no icterus. MSK: Normal muscle bulk,tone, power  Data Reviewed: I have personally reviewed following labs and imaging studies  CBC: Recent Labs  Lab 04/22/22 0020  WBC 4.8  HGB 14.1  HCT 42.9  MCV 88.3  PLT 768    Basic Metabolic Panel: Recent Labs  Lab 04/16/22 0920 04/22/22 0020  NA 141 138  K 3.8 4.0  CL 107 106  CO2 23 26  GLUCOSE 146* 103*  BUN 10 10  CREATININE 1.13* 1.07*  CALCIUM 8.9 9.0  MG  --  2.1    GFR: Estimated Creatinine Clearance: 61.1 mL/min (A) (by C-G formula based on SCr of 1.07 mg/dL (H)). Liver Function Tests: Recent Labs  Lab 04/22/22 0020  AST 21  ALT 22  ALKPHOS 51  BILITOT 0.3  PROT 6.9  ALBUMIN 3.5   Radiology Studies: No results found. Scheduled Meds:  (feeding supplement) PROSource Plus  30 mL Oral BID BM   enoxaparin (LOVENOX) injection  40 mg Subcutaneous Q24H   feeding supplement (KATE FARMS STANDARD 1.4)  325 mL Oral BID BM   haloperidol  1 mg Oral BID   hydrocerin   Topical BID   LORazepam  1 mg Intramuscular Once   LORazepam  2 mg Oral Once   scopolamine  1 patch Transdermal Q72H   Continuous Infusions:   LOS: 117 days   Antonieta Pert, MD [facility]  04/22/2022, 7:27 AM

## 2022-04-23 DIAGNOSIS — F05 Delirium due to known physiological condition: Secondary | ICD-10-CM

## 2022-04-23 NOTE — TOC Progression Note (Signed)
Transition of Care [facility]) - Progression Note    Patient Details  Name: TERRIKA ZUVER MRN: [id number] Date of Birth: [birth year]  Transition of Care [facility]) CM/SW [place], RN Phone Number: 04/23/2022, 9:17 AM  Clinical Narrative:    CM spoke with the patient's boyfriend and the patient's disability is still pending.  Hospital leadership is aware and continues to follow.  Patient continues to need Locked Memory Unit placement but has no access to disability funds to pay for admission.     Barriers to Discharge: Financial Resources, Inadequate or no insurance, Homeless with medical needs  Expected Discharge Plan and Services In-house Referral: Clinical Social Work   Post Acute Care Choice: Nursing Home Living arrangements for the past 2 months: Homeless [facility]) Expected Discharge Date: 12/24/21                                     Social Determinants of Health (SDOH) Interventions SDOH Screenings   Food Insecurity: No Food Insecurity (03/05/2022)  Housing: High Risk (03/21/2022)  Alcohol Screen: Low Risk  (09/06/2021)  Depression (PHQ2-9): Medium Risk (05/26/2020)  Tobacco Use: High Risk (03/21/2022)    Readmission Risk Interventions     No data to display

## 2022-04-23 NOTE — TOC Progression Note (Signed)
Transition of Care [facility]) - Progression Note    Patient Details  Name: Natalie Brown MRN: [id number] Date of Birth: 03/26/1966  Transition of Care [facility]) CM/SW [place], RN Phone Number: 04/23/2022, 11:48 AM  Clinical Narrative:    Secure email was sent to Shanon Rosser to request follow up regarding patient's pending disability.  Joya Gaskins is out of the office but I will follow up with Verlee Monte, [facility] leadership regarding patient's disability process.     Barriers to Discharge: Financial Resources, Inadequate or no insurance, Homeless with medical needs  Expected Discharge Plan and Services In-house Referral: Clinical Social Work   Post Acute Care Choice: Nursing Home Living arrangements for the past 2 months: Homeless [facility]) Expected Discharge Date: 12/24/21                                     Social Determinants of Health (SDOH) Interventions SDOH Screenings   Food Insecurity: No Food Insecurity (03/05/2022)  Housing: High Risk (03/21/2022)  Alcohol Screen: Low Risk  (09/06/2021)  Depression (PHQ2-9): Medium Risk (05/26/2020)  Tobacco Use: High Risk (03/21/2022)    Readmission Risk Interventions     No data to display

## 2022-04-23 NOTE — Progress Notes (Signed)
Progress Note    Natalie Brown   UKG:[id number]  DOB: May 02, 1965  DOA: 12/24/2021     118 PCP: Marliss Coots, NP  Initial CC: AMS  Hospital Course: Natalie Brown is a 57 yo female with PMH bipolar disorder, HTN, recent hospitalization at [facility] who presents to the hospital with altered mentation and tachycardia. Major events: 09/07/2021 admitted to behavioral health.  Treated with Zyprexa Invega and Ativan challenge for catatonia. 12/24/2021 sent to ER from [facility] for evaluation for tachycardia 9/04-9/5, 9/5>9/9, LTM EEG  no seizure. TSH stable CTA chest no PE no pneumonia RPR nonreactive bilateral LE Doppler negative for DVT Echo with normal EF  9/23 febrile secondary to Covid 19 + no pneumonia. 9/25 repeat CT brain unremarkable. She has had prolonged hospitalization seen by psychiatry.Per psychiatry does not meet criteria to go back to behavioral health, awaiting for placement to memory care unit.  Interval History:  No events overnight.  Patient sitting on foot of bed this morning playing solitaire on her tablet.  She would not make eye contact nor engage in conversation.  Mostly noted to sit there and play while I spoke to her.  Assessment and Plan:  Acute encephalopathy Hypoactive delirium Background dementia - Continues to be confused not interacting not communicative although alert awake, feeding herself, ambulating and is unchanged over past few months.  - Mental status felt to be baseline now and is 2/2 undiagnosed dementia with significant cerebral/cerebellar atrophy as per imagings and it appears to be progressive.  Seen by neurology here. Recommend outpatient follow-up.  No evidence of seizures.  Delirium precaution, fall precaution and continue supportive care. - continue tele sitter   Bipolar disorder - Seen by psychiatry.  Currently mood stable.   - Continue current medication and needs outpatient follow-up with psychiatry.   AKI on CKD stage IIIa: Baseline creatinine  1.3.  Stable.  Checking labs weekly   Vitamin B12 deficiency: Resolved last B12 1195 on 12/11, discontinued B12 supplement   Sinus tachycardia - resolved  -Previously on propranolol, discontinued due to hypotension/bradycardia   Tobacco use: cont nicotine patch   Disposition: Prolonged hospitalization pending placement.  TOC following   Hyperkalemia: Resolved.  COVID infection - resolved  asymptomatic now, stable. On RA/afebrile. She had elevated D-dimer.CTA chest/lower extremity Doppler negative for VTE.   Old records reviewed in assessment of this patient   DVT prophylaxis:  enoxaparin (LOVENOX) injection 40 mg Start: 12/26/21 1200   Code Status:   Code Status: Full Code  Mobility Assessment (last 72 hours)     Mobility Assessment     Row Name 04/22/22 2145 04/20/22 1958         Does patient have an order for bedrest or is patient medically unstable No - Continue assessment No - Continue assessment      What is the highest level of mobility based on the progressive mobility assessment? Level 5 (Walks with assist in room/hall) - Balance while stepping forward/back and can walk in room with assist - Complete Level 6 (Walks independently in room and hall) - Balance while walking in room without assist - Complete               Barriers to discharge: difficult to place Disposition Plan:  u/k Status is: Inpt  Objective: Blood pressure (!) 124/112, pulse 87, temperature 98.4 F (36.9 C), resp. rate 18, height 5\' 6"  (1.676 m), weight 72 kg, SpO2 98 %.  Examination: Patient unable to consent for exam, thus  full exam deferred Physical Exam Constitutional:      General: [name] not in acute distress.    Appearance: [name] not ill-appearing or toxic-appearing.     Comments: Adult woman sitting on edge of bed playing solitaire on tablet but unwilling to engage in conversation or follow commands  HENT:     Head: Normocephalic and atraumatic.  Eyes:     Extraocular  Movements: Extraocular movements intact.  Pulmonary:     Effort: Pulmonary effort is normal. No respiratory distress.  Abdominal:     General: There is no distension.  Musculoskeletal:     Cervical back: Normal range of motion.  Neurological:     Comments: Not following commands or engaging in conversation      Consultants:    Procedures:    Data Reviewed: No results found for this or any previous visit (from the past 24 hour(s)).  I have Reviewed nursing notes, Vitals, and Lab results since pt's last encounter. Pertinent lab results : see above I have ordered labwork to follow up on.  I have reviewed the last note from staff over past 24 hours I have discussed pt's care plan and test results with nursing staff, CM/SW, and other staff as appropriate  Time spent: Greater than 50% of the 55 minute visit was spent in counseling/coordination of care for the patient as laid out in the A&P.   LOS: 118 days   Dwyane Dee, MD [facility] 04/23/2022, 3:08 PM
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
## 2022-04-24 NOTE — Progress Notes (Signed)
Mobility Specialist Progress Note:   04/24/22 1001  Mobility  Activity Ambulated independently in hallway  Level of Assistance Independent  Assistive Device None  Distance Ambulated (ft) 200 ft  Activity Response Tolerated well  Mobility Referral Yes  $Mobility charge 1 Mobility   Pt received ambulating in hallway and agreeable to redirection back to room. Pt proceeded to walk past own room and enter another patients room. Pt difficult to redirect. Pt left in bed with all needs met and call bell in reach.     Mobility Specialist Please contact via SecureChat or  Rehab office at [phone]  

## 2022-04-24 NOTE — Progress Notes (Signed)
Mobility Specialist Progress Note:   04/24/22 0945  Mobility  Activity Ambulated independently in hallway  Level of Assistance Independent  Assistive Device None  Distance Ambulated (ft) 200 ft  Activity Response Tolerated well  Mobility Referral Yes  $Mobility charge 1 Mobility   Pt received in hallway and agreeable to redirection back to room. Asymptomatic throughout. Pt left in bed with all needs met, call bell in reach, and family in room.   Andrey Campanile Mobility Specialist Please contact via SecureChat or  Rehab office at [phone]

## 2022-04-25 NOTE — TOC Progression Note (Signed)
Transition of Care [facility]) - Progression Note    Patient Details  Name: LIANNA SITZMANN MRN: [id number] Date of Birth: Aug 26, 1965  Transition of Care [facility]) CM/SW [place], RN Phone Number: 04/25/2022, 4:30 PM  Clinical Narrative:    CM spoke with Shanon Rosser, Financial counseling supervisor and she followed up with [employer] and states that the patient's disability application is still pending.  Patient remains inpatient waiting on locked Memory Care facility and availability to funds to support payment for the facility.  Disability application is still pending for this funding.  Hospital leadership will continue to follow for support.     Barriers to Discharge: Financial Resources, Inadequate or no insurance, Homeless with medical needs  Expected Discharge Plan and Services In-house Referral: Clinical Social Work   Post Acute Care Choice: Nursing Home Living arrangements for the past 2 months: Homeless [facility]) Expected Discharge Date: 12/24/21                                     Social Determinants of Health (SDOH) Interventions SDOH Screenings   Food Insecurity: No Food Insecurity (03/05/2022)  Housing: High Risk (03/21/2022)  Alcohol Screen: Low Risk  (09/06/2021)  Depression (PHQ2-9): Medium Risk (05/26/2020)  Tobacco Use: High Risk (03/21/2022)    Readmission Risk Interventions     No data to display

## 2022-04-25 NOTE — Progress Notes (Signed)
Mobility Specialist Progress Note:   04/25/22 1535  Mobility  Activity Ambulated independently in hallway;Ambulated with assistance in hallway  Level of Assistance Contact guard assist, steadying assist  Assistive Device Other (Comment) (HHA)  Distance Ambulated (ft) 500 ft  Activity Response Tolerated well  Mobility Referral Yes  $Mobility charge 1 Mobility   Pt eager for mobility session. Required HHA for comfort. Pt back in bed with all needs met.   Nelta Numbers Mobility Specialist Please contact via SecureChat or  Rehab office at [phone]

## 2022-04-25 NOTE — Progress Notes (Signed)
Progress Note    Natalie Brown   UVO:[id number]  DOB: Feb 07, 1966  DOA: 12/24/2021     120 PCP: Marliss Coots, NP  Initial CC: AMS  Hospital Course: Natalie Brown is a 57 yo female with PMH bipolar disorder, HTN, recent hospitalization at [facility] who presents to the hospital with altered mentation and tachycardia. Major events: 09/07/2021 admitted to behavioral health.  Treated with Zyprexa Invega and Ativan challenge for catatonia. 12/24/2021 sent to ER from [facility] for evaluation for tachycardia 9/04-9/5, 9/5>9/9, LTM EEG  no seizure. TSH stable CTA chest no PE no pneumonia RPR nonreactive bilateral LE Doppler negative for DVT Echo with normal EF  9/23 febrile secondary to Covid 19 + no pneumonia. 9/25 repeat CT brain unremarkable. She has had prolonged hospitalization seen by psychiatry.Per psychiatry does not meet criteria to go back to behavioral health, awaiting for placement to memory care unit.  Interval History:  No events overnight.  Sitting in bed awake this morning.  Did not talk any this morning nor follow any commands.  Assessment and Plan:  Acute encephalopathy Hypoactive delirium Background dementia - Continues to be confused not interacting not communicative although alert awake, feeding herself, ambulating and is unchanged over past few months.  - Mental status felt to be baseline now and is 2/2 undiagnosed dementia with significant cerebral/cerebellar atrophy as per imagings and it appears to be progressive.  Seen by neurology here. Recommend outpatient follow-up.  No evidence of seizures.  Delirium precaution, fall precaution and continue supportive care. - continue tele sitter   Bipolar disorder - Seen by psychiatry. Currently mood stable.   - Continue current medication and needs outpatient follow-up with psychiatry.   AKI on CKD stage IIIa: Baseline creatinine 1.3.  Stable.  Checking labs weekly   Vitamin B12 deficiency: Resolved last B12 1195 on 12/11,  discontinued B12 supplement   Sinus tachycardia - resolved  -Previously on propranolol, discontinued due to hypotension/bradycardia   Tobacco use: cont nicotine patch   Disposition: Prolonged hospitalization pending placement.  TOC following   Hyperkalemia: Resolved.  COVID infection - resolved  asymptomatic now, stable. On RA/afebrile. She had elevated D-dimer.CTA chest/lower extremity Doppler negative for VTE.   Old records reviewed in assessment of this patient   DVT prophylaxis:  enoxaparin (LOVENOX) injection 40 mg Start: 12/26/21 1200   Code Status:   Code Status: Full Code  Mobility Assessment (last 72 hours)     Mobility Assessment     Row Name 04/24/22 0038 04/22/22 2145         Does patient have an order for bedrest or is patient medically unstable No - Continue assessment No - Continue assessment      What is the highest level of mobility based on the progressive mobility assessment? Level 5 (Walks with assist in room/hall) - Balance while stepping forward/back and can walk in room with assist - Complete Level 5 (Walks with assist in room/hall) - Balance while stepping forward/back and can walk in room with assist - Complete               Barriers to discharge: difficult to place Disposition Plan:  u/k Status is: Inpt  Objective: Blood pressure 109/76, pulse (!) 102, temperature 98.6 F (37 C), temperature source Oral, resp. rate 18, height 5\' 6"  (1.676 m), weight 72 kg, SpO2 99 %.  Examination: Patient unable to consent for exam, thus full exam deferred Physical Exam Constitutional:      General: [name] not  in acute distress.    Appearance: [name] not ill-appearing or toxic-appearing.     Comments: Adult woman sitting up in bed in no distress; nonverbal today  HENT:     Head: Normocephalic and atraumatic.  Eyes:     Extraocular Movements: Extraocular movements intact.  Pulmonary:     Effort: Pulmonary effort is normal. No respiratory distress.   Abdominal:     General: There is no distension.  Musculoskeletal:     Cervical back: Normal range of motion.  Neurological:     Comments: Moves all 4 extremities easily, no perceived focal weakness. Talking some      Consultants:    Procedures:    Data Reviewed: No results found for this or any previous visit (from the past 24 hour(s)).  I have Reviewed nursing notes, Vitals, and Lab results since pt's last encounter.  See above for any pertinent lab results I have ordered labwork to follow up on.  I have reviewed the last note from staff over past 24 hours I have discussed pt's care plan and test results with nursing staff, CM/SW, and other staff as appropriate    LOS: 120 days   Dwyane Dee, MD [facility] 04/25/2022, 5:36 PM

## 2022-04-26 NOTE — Progress Notes (Signed)
Mobility Specialist Progress Note:   04/26/22 1237  Mobility  Activity Ambulated with assistance in hallway  Level of Assistance Contact guard assist, steadying assist (MaxA to direct/redirect)  Assistive Device None  Distance Ambulated (ft) 200 ft  Activity Response Tolerated well  Mobility Referral Yes  $Mobility charge 1 Mobility   Pt received ambulating in hallway while looking at tablet and requesting assistance to fix tablet. Fixed tablet and redirected back to room. Required MaxA to redirect, direct back to room, and to avoid obstacles in hallway d/t not looking up from tablet. Pt left on tablet and in bed with all needs met, call bell in reach, and bed alarm on.   Andrey Campanile Mobility Specialist Please contact via SecureChat or  Rehab office at [phone]

## 2022-04-26 NOTE — Progress Notes (Signed)
Mobility Specialist Progress Note:   04/26/22 1011  Mobility  Activity Ambulated with assistance in hallway  Level of Assistance Contact guard assist, steadying assist (MaxA to direct/redirect)  Assistive Device None  Distance Ambulated (ft) 1000 ft  Activity Response Tolerated well  Mobility Referral Yes  $Mobility charge 1 Mobility   Pt received in bed and playing game on tablet. Agreeable to mobility session but refused to put tablet down throughout session, even with max encouragement. Required MaxA to maneuver around obstacles in hallway and redirect d/t being focused on tablet instead of where she was going. Pt walked into a PT in hallway and still did not look up from tablet. Asymptomatic throughout. Pt left in bed on tablet with all needs met and call bell in reach.   Andrey Campanile Mobility Specialist Please contact via SecureChat or  Rehab office at [phone]

## 2022-04-26 NOTE — Progress Notes (Signed)
Progress Note    Natalie Brown   BJY:[id number]  DOB: 21-Dec-1965  DOA: 12/24/2021     121 PCP: Marliss Coots, NP  Initial CC: AMS  Hospital Course: Natalie Brown is a 57 yo female with PMH bipolar disorder, HTN, recent hospitalization at [facility] who presents to the hospital with altered mentation and tachycardia. Major events: 09/07/2021 admitted to behavioral health.  Treated with Zyprexa Invega and Ativan challenge for catatonia. 12/24/2021 sent to ER from [facility] for evaluation for tachycardia 9/04-9/5, 9/5>9/9, LTM EEG  no seizure. TSH stable CTA chest no PE no pneumonia RPR nonreactive bilateral LE Doppler negative for DVT Echo with normal EF  9/23 febrile secondary to Covid 19 + no pneumonia. 9/25 repeat CT brain unremarkable. She has had prolonged hospitalization seen by psychiatry.Per psychiatry does not meet criteria to go back to behavioral health, awaiting for placement to memory care unit.  Interval History:  No events overnight.  Sitting in bed awake this morning.  Did not talk any this morning nor follow any commands.  Assessment and Plan:  Acute encephalopathy - resolved  Hypoactive delirium Dementia - Continues to be confused not interacting not communicative although alert awake, feeding herself, ambulating and is unchanged over past few months.  - Mental status felt to be baseline now and is 2/2 dementia with significant cerebral/cerebellar atrophy as per imagings and it appears to be progressive.  Seen by neurology here. Recommend outpatient follow-up.  No evidence of seizures.  Delirium precaution, fall precaution and continue supportive care. - continue tele sitter - working on placement in memory care unit - d/c scopolamine patch   Bipolar disorder - Seen by psychiatry. Currently mood stable -Family has communicated that they do not plan on outpatient follow-up with psychiatry but are welcome to -Continue Haldol 1 mg twice daily - Continue Atarax and Ativan as  needed - Continue Zyprexa PRN   AKI on CKD stage IIIa: Baseline creatinine 1.3.  Stable.  Checking labs weekly   Vitamin B12 deficiency: Resolved last B12 1195 on 12/11, discontinued B12 supplement   Sinus tachycardia - resolved  -Previously on propranolol, discontinued due to hypotension/bradycardia   Tobacco use: cont nicotine patch   Disposition: Prolonged hospitalization pending placement.  TOC following   Hyperkalemia: Resolved.  COVID infection - resolved  asymptomatic now, stable. On RA/afebrile. She had elevated D-dimer.CTA chest/lower extremity Doppler negative for VTE.   Old records reviewed in assessment of this patient   DVT prophylaxis:  enoxaparin (LOVENOX) injection 40 mg Start: 12/26/21 1200   Code Status:   Code Status: Full Code  Mobility Assessment (last 72 hours)     Mobility Assessment     Row Name 04/24/22 0038           Does patient have an order for bedrest or is patient medically unstable No - Continue assessment       What is the highest level of mobility based on the progressive mobility assessment? Level 5 (Walks with assist in room/hall) - Balance while stepping forward/back and can walk in room with assist - Complete                Barriers to discharge: difficult to place Disposition Plan:  u/k Status is: Inpt  Objective: Blood pressure (!) 121/97, pulse 86, temperature 98.9 F (37.2 C), resp. rate 19, height 5\' 6"  (1.676 m), weight 72 kg, SpO2 100 %.  Examination: Patient unable to consent for exam, thus full exam deferred Physical Exam Constitutional:  General: [name] not in acute distress.    Appearance: [name] not ill-appearing or toxic-appearing.     Comments: Adult woman sitting up in bed in no distress; nonverbal today  HENT:     Head: Normocephalic and atraumatic.  Eyes:     Extraocular Movements: Extraocular movements intact.  Pulmonary:     Effort: Pulmonary effort is normal. No respiratory distress.   Abdominal:     General: There is no distension.  Musculoskeletal:     Cervical back: Normal range of motion.  Neurological:     Comments: Moves all 4 extremities easily, no perceived focal weakness      Consultants:    Procedures:    Data Reviewed: No results found for this or any previous visit (from the past 24 hour(s)).  I have Reviewed nursing notes, Vitals, and Lab results since pt's last encounter.  See above for any pertinent lab results I have ordered labwork to follow up on.  I have reviewed the last note from staff over past 24 hours I have discussed pt's care plan and test results with nursing staff, CM/SW, and other staff as appropriate    LOS: 121 days   Dwyane Dee, MD [facility] 04/26/2022, 2:48 PM

## 2022-04-27 NOTE — Progress Notes (Signed)
Progress Note    Natalie Brown   ZOX:[id number]  DOB: 1966/01/28  DOA: 12/24/2021     122 PCP: Marliss Coots, NP  Initial CC: AMS  Hospital Course: Ms. Vowles is a 57 yo female with PMH bipolar disorder, HTN, recent hospitalization at [facility] who presents to the hospital with altered mentation and tachycardia. Major events: 09/07/2021 admitted to behavioral health.  Treated with Zyprexa Invega and Ativan challenge for catatonia. 12/24/2021 sent to ER from [facility] for evaluation for tachycardia 9/04-9/5, 9/5>9/9, LTM EEG  no seizure. TSH stable CTA chest no PE no pneumonia RPR nonreactive bilateral LE Doppler negative for DVT Echo with normal EF  9/23 febrile secondary to Covid 19 + no pneumonia. 9/25 repeat CT brain unremarkable. She has had prolonged hospitalization seen by psychiatry.Per psychiatry does not meet criteria to go back to behavioral health, awaiting for placement to memory care unit.  Interval History:  No events overnight.  Sitting in bed awake this morning.  Did not talk any this morning nor follow any commands. She mostly just stared at me while I talked today.   Assessment and Plan:  Acute encephalopathy - resolved  Hypoactive delirium Dementia - Continues to be confused not interacting not communicative although alert awake, feeding herself, ambulating and is unchanged over past few months.  - Mental status felt to be baseline now and is 2/2 dementia with significant cerebral/cerebellar atrophy as per imagings and it appears to be progressive.  Seen by neurology here. Recommend outpatient follow-up.  No evidence of seizures.  Delirium precaution, fall precaution and continue supportive care. - continue tele sitter - working on placement in memory care unit - d/c scopolamine patch on 04/26/22; likely not needed anymore   Bipolar disorder - Seen by psychiatry. Currently mood stable -Family has communicated that they do not plan on outpatient follow-up with psychiatry  but are welcome to -Continue Haldol 1 mg twice daily - Continue Atarax and Ativan as needed - Continue Zyprexa PRN   AKI on CKD stage IIIa: Baseline creatinine 1.3.  Stable.  Checking labs weekly   Vitamin B12 deficiency: Resolved last B12 1195 on 12/11, discontinued B12 supplement   Sinus tachycardia - resolved  -Previously on propranolol, discontinued due to hypotension/bradycardia   Tobacco use: cont nicotine patch   Disposition: Prolonged hospitalization pending placement.  TOC following   Hyperkalemia: Resolved.  COVID infection - resolved  asymptomatic now, stable. On RA/afebrile. She had elevated D-dimer.CTA chest/lower extremity Doppler negative for VTE.   Old records reviewed in assessment of this patient   DVT prophylaxis:  enoxaparin (LOVENOX) injection 40 mg Start: 12/26/21 1200   Code Status:   Code Status: Full Code  Mobility Assessment (last 72 hours)     Mobility Assessment     Row Name 04/27/22 0900 04/26/22 2241         Does patient have an order for bedrest or is patient medically unstable Yes- Bedfast (Level 1) - Complete Yes- Bedfast (Level 1) - Complete      What is the highest level of mobility based on the progressive mobility assessment? Level 6 (Walks independently in room and hall) - Balance while walking in room without assist - Complete Level 6 (Walks independently in room and hall) - Balance while walking in room without assist - Complete               Barriers to discharge: difficult to place Disposition Plan:  u/k Status is: Inpt  Objective: Blood pressure  94/64, pulse 97, temperature 98 F (36.7 C), temperature source Oral, resp. rate 18, height 5\' 6"  (1.676 m), weight 72 kg, SpO2 100 %.  Examination: Patient unable to consent for exam, thus full exam deferred Physical Exam Constitutional:      General: [name] not in acute distress.    Appearance: [name] not ill-appearing or toxic-appearing.     Comments: Adult woman sitting  up in bed in no distress; nonverbal today  HENT:     Head: Normocephalic and atraumatic.  Eyes:     Extraocular Movements: Extraocular movements intact.  Pulmonary:     Effort: Pulmonary effort is normal. No respiratory distress.  Abdominal:     General: There is no distension.  Musculoskeletal:     Cervical back: Normal range of motion.  Neurological:     Comments: Moves all 4 extremities easily, no perceived focal weakness      Consultants:    Procedures:    Data Reviewed: No results found for this or any previous visit (from the past 24 hour(s)).  I have Reviewed nursing notes, Vitals, and Lab results since pt's last encounter.  See above for any pertinent lab results I have ordered labwork to follow up on.  I have reviewed the last note from staff over past 24 hours I have discussed pt's care plan and test results with nursing staff, CM/SW, and other staff as appropriate    LOS: 122 days   , MD [facility] 04/27/2022, 2:12 PM

## 2022-04-27 NOTE — Progress Notes (Signed)
Mobility Specialist Progress Note:   04/27/22 0928  Mobility  Activity Ambulated independently in hallway  Level of Assistance Independent  Assistive Device None  Distance Ambulated (ft) 500 ft  Activity Response Tolerated well  Mobility Referral Yes  $Mobility charge 1 Mobility   Pt received in bed and agreeable. Asymptomatic throughout. Pt left in bed with all needs met and call bell in reach.   Andrey Campanile Mobility Specialist Please contact via SecureChat or  Rehab office at [phone]

## 2022-04-28 NOTE — Progress Notes (Signed)
Family requests that the nurses assist Natalie Brown with calling Natalie Brown daily. His number is located on the window and the whiteboard. Plan of care continues

## 2022-04-28 NOTE — Progress Notes (Signed)
Progress Note    QUIANA COBAUGH   TDV:[id number]  DOB: December 19, 1965  DOA: 12/24/2021     123 PCP: Lavinia Sharps, NP  Initial CC: AMS  Hospital Course: Ms. Mifflin is a 57 yo female with PMH bipolar disorder, HTN, recent hospitalization at [facility] who presents to the hospital with altered mentation and tachycardia. Major events: 09/07/2021 admitted to behavioral health.  Treated with Zyprexa Invega and Ativan challenge for catatonia. 12/24/2021 sent to ER from [facility] for evaluation for tachycardia 9/04-9/5, 9/5>9/9, LTM EEG  no seizure. TSH stable CTA chest no PE no pneumonia RPR nonreactive bilateral LE Doppler negative for DVT Echo with normal EF  9/23 febrile secondary to Covid 19 + no pneumonia. 9/25 repeat CT brain unremarkable. She has had prolonged hospitalization seen by psychiatry.Per psychiatry does not meet criteria to go back to behavioral health, awaiting for placement to memory care unit.  Interval History:  No events overnight.  Sitting up in bed as usual.  Still would not talk to me any this morning.  Just stared at me and rocking back and forth as usual.  No distress.  Assessment and Plan:  Acute encephalopathy - resolved  Hypoactive delirium Dementia - Continues to be confused not interacting not communicative although alert awake, feeding herself, ambulating and is unchanged over past few months.  - Mental status felt to be baseline now and is 2/2 dementia with significant cerebral/cerebellar atrophy as per imagings and it appears to be progressive.  Seen by neurology here. Recommend outpatient follow-up.  No evidence of seizures.  Delirium precaution, fall precaution and continue supportive care. - continue tele sitter - working on placement in memory care unit - d/c scopolamine patch on 04/26/22; likely not needed anymore   Bipolar disorder - Seen by psychiatry. Currently mood stable -Family has communicated that they do not plan on outpatient follow-up with psychiatry  but are welcome to -Continue Haldol 1 mg twice daily - Continue Atarax and Ativan as needed - Continue Zyprexa PRN   AKI on CKD stage IIIa: Baseline creatinine 1.3.  Stable.  Checking labs weekly   Vitamin B12 deficiency: Resolved last B12 1195 on 12/11, discontinued B12 supplement   Sinus tachycardia - resolved  -Previously on propranolol, discontinued due to hypotension/bradycardia   Tobacco use: cont nicotine patch   Disposition: Prolonged hospitalization pending placement.  TOC following   Hyperkalemia: Resolved.  COVID infection - resolved  asymptomatic now, stable. On RA/afebrile. She had elevated D-dimer.CTA chest/lower extremity Doppler negative for VTE.   Old records reviewed in assessment of this patient   DVT prophylaxis:  enoxaparin (LOVENOX) injection 40 mg Start: 12/26/21 1200   Code Status:   Code Status: Full Code  Mobility Assessment (last 72 hours)     Mobility Assessment     Row Name 04/28/22 0950 04/27/22 1953 04/27/22 0900 04/26/22 2241     Does patient have an order for bedrest or is patient medically unstable Yes- Bedfast (Level 1) - Complete Yes- Bedfast (Level 1) - Complete Yes- Bedfast (Level 1) - Complete Yes- Bedfast (Level 1) - Complete    What is the highest level of mobility based on the progressive mobility assessment? Level 6 (Walks independently in room and hall) - Balance while walking in room without assist - Complete Level 6 (Walks independently in room and hall) - Balance while walking in room without assist - Complete Level 6 (Walks independently in room and hall) - Balance while walking in room without assist - Complete  Level 6 (Walks independently in room and hall) - Balance while walking in room without assist - Complete             Barriers to discharge: difficult to place Disposition Plan:  u/k Status is: Inpt  Objective: Blood pressure 99/67, pulse (!) 108, temperature (!) 97.4 F (36.3 C), temperature source Oral, resp.  rate 18, height 5\' 6"  (1.676 m), weight 72 kg, SpO2 100 %.  Examination: Patient unable to consent for exam, thus full exam deferred Physical Exam Constitutional:      General: [name] not in acute distress.    Appearance: [name] not ill-appearing or toxic-appearing.     Comments: Adult woman sitting up in bed in no distress; nonverbal today  HENT:     Head: Normocephalic and atraumatic.  Eyes:     Extraocular Movements: Extraocular movements intact.  Pulmonary:     Effort: Pulmonary effort is normal. No respiratory distress.  Abdominal:     General: There is no distension.  Musculoskeletal:     Cervical back: Normal range of motion.  Neurological:     Comments: Moves all 4 extremities easily, no perceived focal weakness      Consultants:    Procedures:    Data Reviewed: No results found for this or any previous visit (from the past 24 hour(s)).  I have Reviewed nursing notes, Vitals, and Lab results since pt's last encounter.  See above for any pertinent lab results I have ordered labwork to follow up on.  I have reviewed the last note from staff over past 24 hours I have discussed pt's care plan and test results with nursing staff, CM/SW, and other staff as appropriate    LOS: 123 days   Dwyane Dee, MD [facility] 04/28/2022, 4:17 PM

## 2022-04-28 NOTE — Progress Notes (Signed)
Mobility Specialist Progress Note:   04/28/22 1009  Mobility  Activity Ambulated independently in hallway  Level of Assistance Independent  Assistive Device None  Distance Ambulated (ft) 500 ft  Activity Response Tolerated well  Mobility Referral Yes  $Mobility charge 1 Mobility   Pt received laying the wrong direction in bed and agreeable. Experienced 1x temporary LOB while putting on shoes, able to self correct. Asymptomatic throughout ambulation. Pt left in bed with all needs met and call bell in reach.   Andrey Campanile Mobility Specialist Please contact via SecureChat or  Rehab office at [phone]

## 2022-04-29 LAB — BASIC METABOLIC PANEL
Anion gap: 10 (ref 5–15)
BUN: 18 mg/dL (ref 6–20)
CO2: 22 mmol/L (ref 22–32)
Calcium: 8.8 mg/dL — ABNORMAL LOW (ref 8.9–10.3)
Chloride: 105 mmol/L (ref 98–111)
Creatinine, Ser: 1.34 mg/dL — ABNORMAL HIGH (ref 0.44–1.00)
GFR, Estimated: 47 mL/min — ABNORMAL LOW (ref >60)
Glucose, Bld: 91 mg/dL (ref 70–99)
Potassium: 4.1 mmol/L (ref 3.5–5.1)
Sodium: 137 mmol/L (ref 135–145)

## 2022-04-29 LAB — CBC
HCT: 42.8 % (ref 36.0–46.0)
Hemoglobin: 14.4 g/dL (ref 12.0–15.0)
MCH: 29.6 pg (ref 26.0–34.0)
MCHC: 33.6 g/dL (ref 30.0–36.0)
MCV: 87.9 fL (ref 80.0–100.0)
Platelets: 319 10*3/uL (ref 150–400)
RBC: 4.87 MIL/uL (ref 3.87–5.11)
RDW: 13.2 % (ref 11.5–15.5)
WBC: 6.3 10*3/uL (ref 4.0–10.5)
nRBC: 0 % (ref 0.0–0.2)

## 2022-04-29 LAB — MAGNESIUM: Magnesium: 2 mg/dL (ref 1.7–2.4)

## 2022-04-29 NOTE — Progress Notes (Signed)
Pt was constantly leaving the room. A patient came and said that she left the unit. We tried calling security and it just rang. Pt escaped took the elevator down to the ground floor and was on the parking deck. Charge nurse and security found her on parking deck and brought her back.

## 2022-04-29 NOTE — Progress Notes (Addendum)
Mobility Specialist Progress Note:    04/29/22 1600  Mobility  Activity Ambulated independently in hallway  Level of Assistance Independent (HHA (comfort))  Assistive Device None  Distance Ambulated (ft) 500 ft  Activity Response Tolerated fair  Mobility Referral Yes  $Mobility charge 1 Mobility   Pt presenting with decr in mentation, needed max encouragement to ambulate. Near EOS pt did not want to return to bed despite maximum cues and encouragement. Called pt's boyfriend as requested when mobility session was complete. Pt remained at EOB.Left pt in bed with all needs met.   Royetta Crochet Mobility Specialist Please contact via Solicitor or  Rehab office at [phone]

## 2022-04-29 NOTE — Progress Notes (Signed)
Progress Note    Natalie Brown   TMH:[id number]  DOB: Mar 26, 1966  DOA: 12/24/2021     124 PCP: Natalie Sharps, NP  Initial CC: AMS  Hospital Course: [name] is a 57 yo female with PMH bipolar disorder, HTN, recent hospitalization at [facility] who presents to the hospital with altered mentation and tachycardia. Major events: 09/07/2021 admitted to behavioral health.  Treated with Zyprexa Invega and Ativan challenge for catatonia. 12/24/2021 sent to ER from [facility] for evaluation for tachycardia 9/04-9/5, 9/5>9/9, LTM EEG  no seizure. TSH stable CTA chest no PE no pneumonia RPR nonreactive bilateral LE Doppler negative for DVT Echo with normal EF  9/23 febrile secondary to Covid 19 + no pneumonia. 9/25 repeat CT brain unremarkable. She has had prolonged hospitalization seen by psychiatry.Per psychiatry does not meet criteria to go back to behavioral health, awaiting for placement to memory care unit.  Interval History:  Patient wandered off unit earlier this morning and was found on the ground floor in the parking deck.  She was escorted back by security. No injuries sustained and patient seen in her room this morning in her usual state.  Assessment and Plan:  Acute encephalopathy - resolved  Hypoactive delirium Dementia - Continues to be confused not interacting not communicative although alert awake, feeding herself, ambulating and is unchanged over past few months.  - Mental status felt to be baseline now and is 2/2 dementia with significant cerebral/cerebellar atrophy as per imagings and it appears to be progressive.  Seen by neurology here. Recommend outpatient follow-up.  No evidence of seizures.  Delirium precaution, fall precaution and continue supportive care. - continue tele sitter - working on placement in memory care unit - d/c scopolamine patch on 04/26/22; likely not needed anymore   Bipolar disorder - Seen by psychiatry. Currently mood stable -Family has communicated that  they do not plan on outpatient follow-up with psychiatry but are welcome to -Continue Haldol 1 mg twice daily - Continue Atarax and Ativan as needed - Continue Zyprexa PRN   AKI on CKD stage IIIa: Baseline creatinine 1.3.  Stable.  Checking labs weekly   Vitamin B12 deficiency: Resolved last B12 1195 on 12/11, discontinued B12 supplement   Sinus tachycardia - resolved  -Previously on propranolol, discontinued due to hypotension/bradycardia   Tobacco use: cont nicotine patch   Disposition: Prolonged hospitalization pending placement.  TOC following   Hyperkalemia: Resolved.  COVID infection - resolved  asymptomatic now, stable. On RA/afebrile. She had elevated D-dimer.CTA chest/lower extremity Doppler negative for VTE.   Old records reviewed in assessment of this patient   DVT prophylaxis:  enoxaparin (LOVENOX) injection 40 mg Start: 12/26/21 1200   Code Status:   Code Status: Full Code  Mobility Assessment (last 72 hours)     Mobility Assessment     Row Name 04/28/22 0950 04/27/22 1953 04/27/22 0900 04/26/22 2241     Does patient have an order for bedrest or is patient medically unstable Yes- Bedfast (Level 1) - Complete Yes- Bedfast (Level 1) - Complete Yes- Bedfast (Level 1) - Complete Yes- Bedfast (Level 1) - Complete    What is the highest level of mobility based on the progressive mobility assessment? Level 6 (Walks independently in room and hall) - Balance while walking in room without assist - Complete Level 6 (Walks independently in room and hall) - Balance while walking in room without assist - Complete Level 6 (Walks independently in room and hall) - Balance while walking in  room without assist - Complete Level 6 (Walks independently in room and hall) - Balance while walking in room without assist - Complete             Barriers to discharge: difficult to place Disposition Plan:  u/k Status is: Inpt  Objective: Blood pressure 129/74, pulse (!) 127,  temperature 98.7 F (37.1 C), temperature source Oral, resp. rate 18, height 5\' 6"  (1.676 m), weight 72 kg, SpO2 100 %.  Examination: Patient unable to consent for exam, thus full exam deferred Physical Exam Constitutional:      General: [name] not in acute distress.    Appearance: [name] not ill-appearing or toxic-appearing.     Comments: Adult woman sitting up in bed in no distress; nonverbal today  HENT:     Head: Normocephalic and atraumatic.     Mouth/Throat:     Mouth: Mucous membranes are moist.  Eyes:     Extraocular Movements: Extraocular movements intact.  Cardiovascular:     Rate and Rhythm: Normal rate and regular rhythm.  Pulmonary:     Effort: Pulmonary effort is normal. No respiratory distress.     Breath sounds: Normal breath sounds.  Abdominal:     General: There is no distension.     Palpations: Abdomen is soft.  Musculoskeletal:        General: No swelling.     Cervical back: Normal range of motion.  Skin:    General: Skin is warm and dry.  Neurological:     General: No focal deficit present.     Comments: Moves all 4 extremities easily, no perceived focal weakness      Consultants:    Procedures:    Data Reviewed: Results for orders placed or performed during the hospital encounter of 12/24/21 (from the past 24 hour(s))  CBC     Status: None   Collection Time: 04/29/22  1:19 AM  Result Value Ref Range   WBC 6.3 4.0 - 10.5 K/uL   RBC 4.87 3.87 - 5.11 MIL/uL   Hemoglobin 14.4 12.0 - 15.0 g/dL   HCT 42.8 36.0 - 46.0 %   MCV 87.9 80.0 - 100.0 fL   MCH 29.6 26.0 - 34.0 pg   MCHC 33.6 30.0 - 36.0 g/dL   RDW 13.2 11.5 - 15.5 %   Platelets 319 150 - 400 K/uL   nRBC 0.0 0.0 - 0.2 %  Magnesium     Status: None   Collection Time: 04/29/22  1:19 AM  Result Value Ref Range   Magnesium 2.0 1.7 - 2.4 mg/dL  Basic metabolic panel     Status: Abnormal   Collection Time: 04/29/22  1:19 AM  Result Value Ref Range   Sodium 137 135 - 145 mmol/L   Potassium  4.1 3.5 - 5.1 mmol/L   Chloride 105 98 - 111 mmol/L   CO2 22 22 - 32 mmol/L   Glucose, Bld 91 70 - 99 mg/dL   BUN 18 6 - 20 mg/dL   Creatinine, Ser 1.34 (H) 0.44 - 1.00 mg/dL   Calcium 8.8 (L) 8.9 - 10.3 mg/dL   GFR, Estimated 47 (L) >60 mL/min   Anion gap 10 5 - 15    I have Reviewed nursing notes, Vitals, and Lab results since pt's last encounter.  See above for any pertinent lab results I have ordered labwork to follow up on.  I have reviewed the last note from staff over past 24 hours I have discussed pt's  care plan and test results with nursing staff, CM/SW, and other staff as appropriate    LOS: 124 days   Lewie Chamber, MD [facility] 04/29/2022, 3:55 PM

## 2022-04-30 NOTE — Progress Notes (Signed)
Progress Note    Natalie Brown   [id number]:[id number]  DOB: [birth year]  DOA: 12/24/2021     125 PCP: Lavinia Sharps, NP  Initial CC: AMS  Hospital Course: Natalie Brown is a 57 yo female with PMH bipolar disorder, HTN, recent hospitalization at [facility] who presents to the hospital with altered mentation and tachycardia. Major events: 09/07/2021 admitted to behavioral health.  Treated with Zyprexa Invega and Ativan challenge for catatonia. 12/24/2021 sent to ER from [facility] for evaluation for tachycardia 9/04-9/5, 9/5>9/9, LTM EEG  no seizure. TSH stable CTA chest no PE no pneumonia RPR nonreactive bilateral LE Doppler negative for DVT Echo with normal EF  9/23 febrile secondary to Covid 19 + no pneumonia. 9/25 repeat CT brain unremarkable. She has had prolonged hospitalization seen by psychiatry.Per psychiatry does not meet criteria to go back to behavioral health, awaiting for placement to memory care unit.  Interval History:  No events overnight. Resting in bed as usual. No change in mentation.   Assessment and Plan:  Acute encephalopathy - resolved  Hypoactive delirium Dementia - Continues to be confused not interacting not communicative although alert awake, feeding herself, ambulating and is unchanged over past few months.  - Mental status felt to be baseline now and is 2/2 dementia with significant cerebral/cerebellar atrophy as per imagings and it appears to be progressive.  Seen by neurology here. Recommend outpatient follow-up.  No evidence of seizures.  Delirium precaution, fall precaution and continue supportive care. - continue tele sitter - working on placement in memory care unit - d/c scopolamine patch on 04/26/22; likely not needed anymore   Bipolar disorder - Seen by psychiatry. Currently mood stable -Family has communicated that they do not plan on outpatient follow-up with psychiatry but are welcome to -Continue Haldol 1 mg twice daily - Continue Atarax and Ativan as  needed - Continue Zyprexa PRN   AKI on CKD stage IIIa: Baseline creatinine 1.3.  Stable.  Checking labs weekly   Vitamin B12 deficiency: Resolved last B12 1195 on 12/11, discontinued B12 supplement   Sinus tachycardia - resolved  -Previously on propranolol, discontinued due to hypotension/bradycardia   Tobacco use: cont nicotine patch   Disposition: Prolonged hospitalization pending placement.  TOC following   Hyperkalemia: Resolved.  COVID infection - resolved  asymptomatic now, stable. On RA/afebrile. She had elevated D-dimer.CTA chest/lower extremity Doppler negative for VTE.   Old records reviewed in assessment of this patient   DVT prophylaxis:  enoxaparin (LOVENOX) injection 40 mg Start: 12/26/21 1200   Code Status:   Code Status: Full Code  Mobility Assessment (last 72 hours)     Mobility Assessment     Row Name 04/28/22 0950 04/27/22 1953         Does patient have an order for bedrest or is patient medically unstable Yes- Bedfast (Level 1) - Complete Yes- Bedfast (Level 1) - Complete      What is the highest level of mobility based on the progressive mobility assessment? Level 6 (Walks independently in room and hall) - Balance while walking in room without assist - Complete Level 6 (Walks independently in room and hall) - Balance while walking in room without assist - Complete               Barriers to discharge: difficult to place; awaiting memory care unit  Disposition Plan:  u/k Status is: Inpt  Objective: Blood pressure 98/74, pulse 99, temperature 98 F (36.7 C), temperature source Oral, resp. rate 20,  height 5\' 6"  (1.676 m), weight 72 kg, SpO2 95 %.  Examination: Patient unable to consent for exam, thus full exam deferred Physical Exam Constitutional:      General: [name] not in acute distress.    Appearance: [name] not ill-appearing or toxic-appearing.     Comments: Adult woman sitting up in bed in no distress; nonverbal today  HENT:      Head: Normocephalic and atraumatic.     Mouth/Throat:     Mouth: Mucous membranes are moist.  Eyes:     Extraocular Movements: Extraocular movements intact.  Cardiovascular:     Rate and Rhythm: Normal rate and regular rhythm.  Pulmonary:     Effort: Pulmonary effort is normal. No respiratory distress.     Breath sounds: Normal breath sounds.  Abdominal:     General: There is no distension.     Palpations: Abdomen is soft.  Musculoskeletal:        General: No swelling.     Cervical back: Normal range of motion.  Skin:    General: Skin is warm and dry.  Neurological:     General: No focal deficit present.     Comments: Moves all 4 extremities easily, no perceived focal weakness      Consultants:    Procedures:    Data Reviewed: No results found for this or any previous visit (from the past 24 hour(s)).   I have Reviewed nursing notes, Vitals, and Lab results since pt's last encounter.  See above for any pertinent lab results I have ordered labwork to follow up on.  I have reviewed the last note from staff over past 24 hours I have discussed pt's care plan and test results with nursing staff, CM/SW, and other staff as appropriate    LOS: 125 days   Dwyane Dee, MD [facility] 04/30/2022, 3:13 PM

## 2022-04-30 NOTE — Progress Notes (Signed)
CSW spoke with Sinclair [phone]. [phone]) at the [facility] branch who states the patient's case has been transferred to the [place] office. Lorna Few states patient's disability claim has been approved however a capability assessment must be completed before any funds are released. Cecilia recommended CSW contact the patient's mother and legal guardian to discuss follow up with [facility] branch.  CSW spoke with patient's mother Gregary Signs via phone to discuss information obtained from [place]. Gregary Signs states she spoke with Mervin Hack [phone]. [phone]) at the [facility] branch approximately 4 weeks ago regarding the determination. Gregary Signs has attempted to reach [place] several times weekly without success since

## 2022-04-30 NOTE — Progress Notes (Signed)
Mobility Specialist Progress Note:   04/30/22 1400  Mobility  Activity Ambulated independently in hallway  Level of Assistance Independent  Assistive Device None  Distance Ambulated (ft) 500 ft  Activity Response Tolerated well  Mobility Referral Yes  $Mobility charge 1 Mobility   Pt received ambulating in hallway with boyfriend Merry Proud) and telesitter alarm going off. Asymptomatic throughout. Redirected pt back to room with assistance from boyfriend. Pt left in room with all needs met, call bell in reach, and boyfriend in room.   Andrey Campanile Mobility Specialist Please contact via SecureChat or  Rehab office at [phone]

## 2022-04-30 NOTE — Progress Notes (Signed)
Pt anxious and walking hallway approaching exits at beginning of shift. Redirected to room and given hs meds. Slept most of the rest of shift. Telesitter in use.

## 2022-04-30 NOTE — TOC Progression Note (Signed)
Transition of Care [facility]) - Progression Note    Patient Details  Name: Natalie Brown MRN: [id number] Date of Birth: 19-Feb-1966  Transition of Care [facility]) CM/SW [place], RN Phone Number: 04/30/2022, 1:48 PM  Clinical Narrative:    CM spoke with Elana Alm, MSW [facility] Supervisor and he asked that I follow up with Shanon Rosser, Financial Counselor Supervisor to determine what level the patient's disability determination has reached.  I sent a secure email to Shanon Rosser requesting follow up if the patient's disability has reached the [facility] level.  Hospital leadership continues to follow the patient for support for placement - disability remains pending but Rolena Infante, MSW states that once disability reaches state level - LOG can be offered for placement at a [place] unit.  CM will continue to follow the patient for TOC needs.     Barriers to Discharge: Financial Resources, Inadequate or no insurance, Homeless with medical needs  Expected Discharge Plan and Services In-house Referral: Clinical Social Work   Post Acute Care Choice: Nursing Home Living arrangements for the past 2 months: Homeless [facility]) Expected Discharge Date: 12/24/21                                     Social Determinants of Health (SDOH) Interventions SDOH Screenings   Food Insecurity: No Food Insecurity (03/05/2022)  Housing: High Risk (03/21/2022)  Alcohol Screen: Low Risk  (09/06/2021)  Depression (PHQ2-9): Medium Risk (05/26/2020)  Tobacco Use: High Risk (03/21/2022)    Readmission Risk Interventions     No data to display

## 2022-05-01 NOTE — Progress Notes (Addendum)
Progress Note    Natalie Brown   VOP:[id number]  DOB: 1965/11/28  DOA: 12/24/2021     126 PCP: Marliss Coots, NP  Initial CC: AMS  Hospital Course: Natalie Brown is a 57 yo female with PMH bipolar disorder, HTN, recent hospitalization at [facility] who presents to the hospital with altered mentation and tachycardia. Major events: 09/07/2021 admitted to behavioral health.  Treated with Zyprexa Invega and Ativan challenge for catatonia. 12/24/2021 sent to ER from [facility] for evaluation for tachycardia 9/04-9/5, 9/5>9/9, LTM EEG  no seizure. TSH stable CTA chest no PE no pneumonia RPR nonreactive bilateral LE Doppler negative for DVT Echo with normal EF  9/23 febrile secondary to Covid 19 + no pneumonia. 9/25 repeat CT brain unremarkable. She has had prolonged hospitalization seen by psychiatry.Per psychiatry does not meet criteria to go back to behavioral health, awaiting for placement to memory care unit.  Interval History:  No events overnight. Resting in bed as usual. No change in mentation.   Assessment and Plan:  Acute encephalopathy - resolved  Hypoactive delirium Dementia, at baseline - Continues to be confused, poorly interacting non-communicative although appears to be alert and awake, feeding herself, ambulating and is unchanged over past few months.  - Mental status felt to be baseline now and is 2/2 dementia with significant cerebral/cerebellar atrophy as per imagings and it appears to be progressive.  Seen by neurology here. Recommend outpatient follow-up.  No evidence of seizures.  Delirium precaution, fall precaution and continue supportive care. - continue tele sitter - working on placement in memory care unit - d/c scopolamine patch on 04/26/22; likely not needed anymore   Bipolar disorder - Seen by psychiatry. Currently mood stable -Family has communicated that they do not plan on outpatient follow-up with psychiatry but are welcome to -Continue Haldol 1 mg twice daily -  Continue Atarax and Ativan as needed - Continue Zyprexa PRN   AKI on CKD stage IIIa: Baseline creatinine 1.3.  Stable.  Checking labs weekly   Vitamin B12 deficiency: Resolved last B12 1195 on 12/11, discontinued B12 supplement   Sinus tachycardia - resolved  -Previously on propranolol, discontinued due to hypotension/bradycardia   Tobacco use: cont nicotine patch   Disposition: Prolonged hospitalization pending placement.  TOC following   Hyperkalemia: Resolved.  COVID infection - resolved  asymptomatic now, stable. On RA/afebrile. She had elevated D-dimer.CTA chest/lower extremity Doppler negative for VTE.   Old records reviewed in assessment of this patient   DVT prophylaxis:  enoxaparin (LOVENOX) injection 40 mg Start: 12/26/21 1200   Code Status:   Code Status: Full Code  Mobility Assessment (last 72 hours)     Mobility Assessment     Row Name 04/30/22 2201 04/28/22 0950         Does patient have an order for bedrest or is patient medically unstable Yes- Bedfast (Level 1) - Complete Yes- Bedfast (Level 1) - Complete      What is the highest level of mobility based on the progressive mobility assessment? Level 6 (Walks independently in room and hall) - Balance while walking in room without assist - Complete Level 6 (Walks independently in room and hall) - Balance while walking in room without assist - Complete               Barriers to discharge: difficult to place; awaiting memory care unit  Disposition Plan:  u/k Status is: Inpt  Objective: Blood pressure (!) 109/96, pulse 93, temperature 98.5 F (36.9 C),  temperature source Oral, resp. rate 16, height 5\' 6"  (1.676 m), weight 72 kg, SpO2 100 %.  Examination: Patient unable to consent for exam, thus full exam deferred Physical Exam Constitutional:      General: [name] not in acute distress.    Appearance: [name] not ill-appearing or toxic-appearing.     Comments: Adult woman sitting up in bed in no  distress; nonverbal today  HENT:     Head: Normocephalic and atraumatic.     Mouth/Throat:     Mouth: Mucous membranes are moist.  Eyes:     Extraocular Movements: Extraocular movements intact.  Cardiovascular:     Rate and Rhythm: Normal rate and regular rhythm.  Pulmonary:     Effort: Pulmonary effort is normal. No respiratory distress.     Breath sounds: Normal breath sounds.  Abdominal:     General: There is no distension.     Palpations: Abdomen is soft.  Musculoskeletal:        General: No swelling.     Cervical back: Normal range of motion.  Skin:    General: Skin is warm and dry.  Neurological:     General: No focal deficit present.     Comments: Moves all 4 extremities easily, no perceived focal weakness      Consultants:    Procedures:    Data Reviewed: No results found for this or any previous visit (from the past 24 hour(s)).   I have Reviewed nursing notes, Vitals, and Lab results since pt's last encounter.  See above for any pertinent lab results I have ordered labwork to follow up on.  I have reviewed the last note from staff over past 24 hours I have discussed pt's care plan and test results with nursing staff, CM/SW, and other staff as appropriate    LOS: 126 days   Little Ishikawa, DO [facility] 05/01/2022, 7:45 AM

## 2022-05-01 NOTE — Progress Notes (Signed)
CSW attempted to reach Mervin Hack [phone]. [phone]) at [facility] office without success - a voicemail was left requesting a return call.  Madilyn Fireman, MSW, LCSW Transitions of Care  Clinical Social Worker II [phone]

## 2022-05-02 NOTE — Plan of Care (Signed)

## 2022-05-02 NOTE — Progress Notes (Signed)
Progress Note    Natalie Brown   ZOX:[id number]  DOB: 1966-03-21  DOA: 12/24/2021     127 PCP: Natalie Coots, NP  Initial CC: AMS  Hospital Course: [name] is a 57 yo female with PMH bipolar disorder, HTN, recent hospitalization at [facility] who presents to the hospital with altered mentation and tachycardia. Major events: 09/07/2021 admitted to behavioral health.  Treated with Zyprexa Invega and Ativan challenge for catatonia. 12/24/2021 sent to ER from [facility] for evaluation for tachycardia 9/04-9/5, 9/5>9/9, LTM EEG  no seizure. TSH stable CTA chest no PE no pneumonia RPR nonreactive bilateral LE Doppler negative for DVT Echo with normal EF  9/23 febrile secondary to Covid 19 + no pneumonia. 9/25 repeat CT brain unremarkable. She has had prolonged hospitalization seen by psychiatry.Per psychiatry does not meet criteria to go back to behavioral health, awaiting for placement to memory care unit.  Interval History:  No events overnight. Resting in bed as usual.  Assessment and Plan:  Acute encephalopathy - resolved  Hypoactive delirium Dementia, at baseline - Continues to be confused, poorly interacting non-communicative although appears to be alert and awake, feeding herself, ambulating and is unchanged over past few months - likely baseline at this point.  - Mental status felt to be baseline now and is 2/2 dementia with significant cerebral/cerebellar atrophy as per imagings and it appears to be progressive.  Seen by neurology here. Recommend outpatient follow-up.  No evidence of seizures.  Delirium precaution, fall precaution and continue supportive care. - continue tele sitter - working on placement in memory care unit - d/c scopolamine patch on 04/26/22; likely not needed anymore   Bipolar disorder - Seen by psychiatry. Currently mood stable -Family has communicated that they do not plan on outpatient follow-up with psychiatry but are welcome to -Continue Haldol 1 mg twice  daily - Continue Atarax and Ativan as needed - Continue Zyprexa PRN   AKI on CKD stage IIIa: Baseline creatinine 1.3.  Stable.  Checking labs weekly   Vitamin B12 deficiency: Resolved last B12 1195 on 12/11, discontinued B12 supplement   Sinus tachycardia - resolved  -Previously on propranolol, discontinued due to hypotension/bradycardia   Tobacco use: cont nicotine patch   Disposition: Prolonged hospitalization pending placement.  TOC following   Hyperkalemia: Resolved.  COVID infection - resolved  asymptomatic now, stable. On RA/afebrile. She had elevated D-dimer.CTA chest/lower extremity Doppler negative for VTE.   Old records reviewed in assessment of this patient   DVT prophylaxis:  enoxaparin (LOVENOX) injection 40 mg Start: 12/26/21 1200   Code Status:   Code Status: Full Code  Mobility Assessment (last 72 hours)     Mobility Assessment     Row Name 05/01/22 2030 04/30/22 2201         Does patient have an order for bedrest or is patient medically unstable No - Continue assessment Yes- Bedfast (Level 1) - Complete      What is the highest level of mobility based on the progressive mobility assessment? Level 6 (Walks independently in room and hall) - Balance while walking in room without assist - Complete Level 6 (Walks independently in room and hall) - Balance while walking in room without assist - Complete               Barriers to discharge: difficult to place; awaiting memory care unit  Disposition Plan:  u/k Status is: Inpt  Objective: Blood pressure 103/81, pulse 98, temperature 98.7 F (37.1 C), temperature source  Oral, resp. rate 16, height 5\' 6"  (1.676 m), weight 72 kg, SpO2 95 %.  Examination: Patient unable to consent for exam, thus full exam deferred  Physical Exam Constitutional:      General: [name] not in acute distress.    Appearance: [name] not ill-appearing or toxic-appearing.     Comments: Adult woman laying in bed in no distress   HENT:     Head: Normocephalic and atraumatic.     Mouth/Throat:     Mouth: Mucous membranes are moist.  Eyes:     Extraocular Movements: Extraocular movements intact.  Cardiovascular:     Rate and Rhythm: Normal rate and regular rhythm.  Pulmonary:     Effort: Pulmonary effort is normal. No respiratory distress.     Breath sounds: Normal breath sounds.  Abdominal:     General: There is no distension.     Palpations: Abdomen is soft.  Musculoskeletal:        General: No swelling.     Cervical back: Normal range of motion.  Skin:    General: Skin is warm and dry.  Neurological:     General: No focal deficit present.     Comments: Moves all 4 extremities easily, no obvious focal weakness/deficits     Consultants:    Procedures:    Data Reviewed: No results found for this or any previous visit (from the past 24 hour(s)).   I have Reviewed nursing notes, Vitals, and Lab results since pt's last encounter.  See above for any pertinent lab results I have ordered labwork to follow up on.  I have reviewed the last note from staff over past 24 hours I have discussed pt's care plan and test results with nursing staff, CM/SW, and other staff as appropriate    LOS: 127 days   Little Ishikawa, DO [facility] 05/02/2022, 7:23 AM

## 2022-05-03 NOTE — Progress Notes (Signed)
Mobility Specialist Progress Note:   05/03/22 1104  Mobility  Activity Ambulated independently in hallway  Level of Assistance Independent  Assistive Device None  Distance Ambulated (ft) 200 ft  Activity Response Tolerated well  Mobility Referral Yes  $Mobility charge 1 Mobility   Pt received ambulating in hallway with food tray in hands. Asymptomatic throughout. Pt left in bed with all needs met and call bell in reach.   Andrey Campanile Mobility Specialist Please contact via SecureChat or  Rehab office at [phone]

## 2022-05-03 NOTE — Progress Notes (Signed)
Mobility Specialist Progress Note:   05/03/22 1034  Mobility  Activity Ambulated independently in hallway  Level of Assistance Independent  Assistive Device None  Distance Ambulated (ft) 100 ft  Activity Response Tolerated well  Mobility Referral Yes  $Mobility charge 1 Mobility   Pt received ambulating in hall and agreeable to redirection back to room. No complaints. Pt left in bed with all needs met, call bell in reach, and family in room.   Andrey Campanile Mobility Specialist Please contact via SecureChat or  Rehab office at [phone]

## 2022-05-03 NOTE — Progress Notes (Signed)
Mobility Specialist Progress Note:   05/03/22 1123  Mobility  Activity Ambulated independently in hallway  Level of Assistance Independent  Assistive Device None  Distance Ambulated (ft) 200 ft  Activity Response Tolerated well  Mobility Referral Yes  $Mobility charge 1 Mobility   Pt received ambulating in hallway with tablet in hands. Pt handed tablet to secretary to fix and then proceeded to climb on countertop to get to tablet. Mobility specialist guided pt safely off countertop and redirected back to room with assistance from RN staff. Pt left in bed with all needs met, call bell in reach, and RN in room.   Andrey Campanile Mobility Specialist Please contact via SecureChat or  Rehab office at [phone]

## 2022-05-03 NOTE — Progress Notes (Signed)
Progress Note    Natalie Brown   [id number]:[id number]  DOB: 1965/11/24  DOA: 12/24/2021     128 PCP: Marliss Coots, NP  Initial CC: AMS  Hospital Course: Natalie Brown is a 57 yo female with PMH bipolar disorder, HTN, recent hospitalization at [facility] who presents to the hospital with altered mentation and tachycardia. Major events: 09/07/2021 admitted to behavioral health.  Treated with Zyprexa Invega and Ativan challenge for catatonia. 12/24/2021 sent to ER from [facility] for evaluation for tachycardia 9/04-9/5, 9/5>9/9, LTM EEG  no seizure. TSH stable CTA chest no PE no pneumonia RPR nonreactive bilateral LE Doppler negative for DVT Echo with normal EF  9/23 febrile secondary to Covid 19 + no pneumonia. 9/25 repeat CT brain unremarkable. She has had prolonged hospitalization seen by psychiatry.Per psychiatry does not meet criteria to go back to behavioral health, awaiting for placement to memory care unit. She remains medically stable for discharge, awaiting safe disposition -difficult placement given above.  Interval History:  No events overnight. Resting in bed as usual.  Assessment and Plan:  Acute encephalopathy - resolved  Hypoactive delirium Dementia, at baseline - Continues to be confused, poorly interacting non-communicative although appears to be alert and awake, feeding herself, ambulating and is unchanged over past few months - likely baseline at this point.  - Mental status felt to be baseline now and is 2/2 dementia with significant cerebral/cerebellar atrophy as per imagings and it appears to be progressive.  Seen by neurology here. Recommend outpatient follow-up.  No evidence of seizures.  Delirium precaution, fall precaution and continue supportive care. - continue tele sitter - working on placement in memory care unit - d/c scopolamine patch on 04/26/22; likely not needed anymore   Bipolar disorder - Seen by psychiatry. Currently mood stable -Family has communicated that they  do not plan on outpatient follow-up with psychiatry but are welcome to -Continue Haldol 1 mg twice daily - Continue Atarax and Ativan as needed - Continue Zyprexa PRN   AKI on CKD stage IIIa: Baseline creatinine 1.3.  Stable.  Checking labs weekly   Vitamin B12 deficiency: Resolved last B12 1195 on 12/11, discontinued B12 supplement   Sinus tachycardia - resolved  -Previously on propranolol, discontinued due to hypotension/bradycardia   Tobacco use: cont nicotine patch   Disposition: Prolonged hospitalization pending placement.  TOC following   Hyperkalemia: Resolved.  COVID infection - resolved  asymptomatic now, stable. On RA/afebrile. She had elevated D-dimer.CTA chest/lower extremity Doppler negative for VTE.   Old records reviewed in assessment of this patient   DVT prophylaxis:  enoxaparin (LOVENOX) injection 40 mg Start: 12/26/21 1200   Code Status:   Code Status: Full Code  Mobility Assessment (last 72 hours)     Mobility Assessment     Row Name 05/02/22 2100 05/02/22 0800 05/01/22 2030 04/30/22 2201     Does patient have an order for bedrest or is patient medically unstable No - Continue assessment No - Continue assessment No - Continue assessment Yes- Bedfast (Level 1) - Complete    What is the highest level of mobility based on the progressive mobility assessment? Level 6 (Walks independently in room and hall) - Balance while walking in room without assist - Complete Level 6 (Walks independently in room and hall) - Balance while walking in room without assist - Complete Level 6 (Walks independently in room and hall) - Balance while walking in room without assist - Complete Level 6 (Walks independently in room and hall) -  Balance while walking in room without assist - Complete             Barriers to discharge: difficult to place; awaiting memory care unit  Disposition Plan:  u/k Status is: Inpt  Objective: Blood pressure 113/73, pulse 88, temperature 98.2  F (36.8 C), temperature source Oral, resp. rate 18, height 5\' 6"  (1.676 m), weight 72 kg, SpO2 100 %.  Examination: Patient unable to consent for exam, thus full exam deferred  Physical Exam Constitutional:      General: [name] not in acute distress.    Appearance: [name] not ill-appearing or toxic-appearing.     Comments: Adult woman laying in bed in no distress  HENT:     Head: Normocephalic and atraumatic.     Mouth/Throat:     Mouth: Mucous membranes are moist.  Eyes:     Extraocular Movements: Extraocular movements intact.  Cardiovascular:     Rate and Rhythm: Normal rate and regular rhythm.  Pulmonary:     Effort: Pulmonary effort is normal. No respiratory distress.     Breath sounds: Normal breath sounds.  Abdominal:     General: There is no distension.     Palpations: Abdomen is soft.  Musculoskeletal:        General: No swelling.     Cervical back: Normal range of motion.  Skin:    General: Skin is warm and dry.  Neurological:     General: No focal deficit present.     Comments: Moves all 4 extremities easily, no obvious focal weakness/deficits     Consultants:    Procedures:    Data Reviewed: No results found for this or any previous visit (from the past 24 hour(s)).   I have Reviewed nursing notes, Vitals, and Lab results since pt's last encounter.  See above for any pertinent lab results I have ordered labwork to follow up on.  I have reviewed the last note from staff over past 24 hours I have discussed pt's care plan and test results with nursing staff, CM/SW, and other staff as appropriate    LOS: 128 days   Little Ishikawa, DO [facility] 05/03/2022, 7:19 AM

## 2022-05-04 NOTE — Progress Notes (Signed)
Patient became more restless during the night and tried to leave the unit several times. Patient seems to be more aware of where the exists are.  Staff had to redirect patient several times and a 1:1 sitter was requested.

## 2022-05-04 NOTE — Progress Notes (Signed)
Progress Note    Natalie Brown   EPP:[id number]  DOB: [birth year]  DOA: 12/24/2021     129 PCP: Marliss Coots, NP  Initial CC: AMS  Hospital Course: Ms. Wandersee is a 57 yo female with PMH bipolar disorder, HTN, recent hospitalization at [facility] who presents to the hospital with altered mentation and tachycardia. Major events: 09/07/2021 admitted to behavioral health.  Treated with Zyprexa Invega and Ativan challenge for catatonia. 12/24/2021 sent to ER from [facility] for evaluation for tachycardia 9/04-9/5, 9/5>9/9, LTM EEG  no seizure. TSH stable CTA chest no PE no pneumonia RPR nonreactive bilateral LE Doppler negative for DVT Echo with normal EF  9/23 febrile secondary to Covid 19 + no pneumonia. 9/25 repeat CT brain unremarkable. She has had prolonged hospitalization seen by psychiatry.Per psychiatry does not meet criteria to go back to behavioral health, awaiting for placement to memory care unit. She remains medically stable for discharge, awaiting safe disposition -difficult placement given above.  Interval History:  Attempted to leave campus again overnight, one-to-one sitter placed overnight, continue until patient can be moved closer to nursing station to avoid further issues.  Assessment and Plan:  Acute encephalopathy - resolved, currently at baseline Hypoactive delirium Dementia, at baseline - Continues to be confused, poorly interacting non-communicative although appears to be alert and awake, feeding herself, ambulating and is unchanged over past few months - likely baseline at this point.  - Mental status felt to be baseline now and is 2/2 dementia with significant cerebral/cerebellar atrophy as per imagings and it appears to be progressive.  Seen by neurology here. Recommend outpatient follow-up.  No evidence of seizures.  Delirium precaution, fall precaution and continue supportive care. 1:1 sitter ongoing - working on placement in memory care unit - d/c scopolamine patch  on 04/26/22; likely not needed anymore   Bipolar disorder - Seen by psychiatry. Currently mood stable -Family has communicated that they do not plan on outpatient follow-up with psychiatry but are welcome to -Continue Haldol 1 mg twice daily - Continue Atarax and Ativan as needed - Continue Zyprexa PRN   AKI on CKD stage IIIa: Baseline creatinine 1.3.  Stable.  Checking labs weekly   Vitamin B12 deficiency: Resolved last B12 1195 on 12/11, discontinued B12 supplement   Sinus tachycardia - resolved  -Previously on propranolol, discontinued due to hypotension/bradycardia   Tobacco use: cont nicotine patch   Disposition: Prolonged hospitalization pending placement.  TOC following   Hyperkalemia: Resolved.  COVID infection - resolved  asymptomatic now, stable. On RA/afebrile. She had elevated D-dimer.CTA chest/lower extremity Doppler negative for VTE.   Old records reviewed in assessment of this patient   DVT prophylaxis:  enoxaparin (LOVENOX) injection 40 mg Start: 12/26/21 1200   Code Status:   Code Status: Full Code  Mobility Assessment (last 72 hours)     Mobility Assessment     Row Name 05/03/22 2005 05/02/22 2100 05/02/22 0800 05/01/22 2030     Does patient have an order for bedrest or is patient medically unstable No - Continue assessment No - Continue assessment No - Continue assessment No - Continue assessment    What is the highest level of mobility based on the progressive mobility assessment? Level 6 (Walks independently in room and hall) - Balance while walking in room without assist - Complete Level 6 (Walks independently in room and hall) - Balance while walking in room without assist - Complete Level 6 (Walks independently in room and hall) - Balance while walking  in room without assist - Complete Level 6 (Walks independently in room and hall) - Balance while walking in room without assist - Complete             Barriers to discharge: difficult to place;  awaiting memory care unit  Disposition Plan:  u/k Status is: Inpt  Objective: Blood pressure 118/81, pulse 94, temperature 98.5 F (36.9 C), temperature source Oral, resp. rate 17, height 5\' 6"  (1.676 m), weight 72 kg, SpO2 100 %.  Examination: Patient unable to consent for exam, thus full exam deferred  Physical Exam Constitutional:      General: [name] not in acute distress.    Appearance: [name] not ill-appearing or toxic-appearing.     Comments: Adult woman laying in bed in no distress  HENT:     Head: Normocephalic and atraumatic.     Mouth/Throat:     Mouth: Mucous membranes are moist.  Eyes:     Extraocular Movements: Extraocular movements intact.  Cardiovascular:     Rate and Rhythm: Normal rate and regular rhythm.  Pulmonary:     Effort: Pulmonary effort is normal. No respiratory distress.     Breath sounds: Normal breath sounds.  Abdominal:     General: There is no distension.     Palpations: Abdomen is soft.  Musculoskeletal:        General: No swelling.     Cervical back: Normal range of motion.  Skin:    General: Skin is warm and dry.  Neurological:     General: No focal deficit present.     Comments: Moves all 4 extremities easily, no obvious focal weakness/deficits     Consultants:    Procedures:    Data Reviewed: No results found for this or any previous visit (from the past 24 hour(s)).   I have Reviewed nursing notes, Vitals, and Lab results since pt's last encounter.  See above for any pertinent lab results I have ordered labwork to follow up on.  I have reviewed the last note from staff over past 24 hours I have discussed pt's care plan and test results with nursing staff, CM/SW, and other staff as appropriate    LOS: 129 days   Little Ishikawa, DO [facility] 05/04/2022, 7:49 AM

## 2022-05-05 NOTE — Progress Notes (Signed)
Progress Note    Natalie Brown   IRW:[id number]  DOB: [birth year]  DOA: 12/24/2021     130 PCP: Marliss Coots, NP  Initial CC: AMS  Hospital Course: [name] is a 57 yo female with PMH bipolar disorder, HTN, recent hospitalization at [facility] who presents to the hospital with altered mentation and tachycardia. Major events: 09/07/2021 admitted to behavioral health.  Treated with Zyprexa Invega and Ativan challenge for catatonia. 12/24/2021 sent to ER from [facility] for evaluation for tachycardia 9/04-9/5, 9/5>9/9, LTM EEG  no seizure. TSH stable CTA chest no PE no pneumonia RPR nonreactive bilateral LE Doppler negative for DVT Echo with normal EF  9/23 febrile secondary to Covid 19 + no pneumonia. 9/25 repeat CT brain unremarkable. She has had prolonged hospitalization seen by psychiatry.Per psychiatry does not meet criteria to go back to behavioral health, awaiting for placement to memory care unit. She remains medically stable for discharge, awaiting safe disposition -difficult placement given above.  Interval History:  Patient continues to be a flight risk, repeat episode yesterday where she attempted to leave the floor requiring multiple staff members as well as patient's significant other to return her to her room review of systems remains limited.  Assessment and Plan:  Acute encephalopathy - resolved, currently at baseline Hypoactive delirium Dementia, at baseline - Continues to be confused, poorly interacting non-communicative although appears to be alert and awake, feeding herself, ambulating and is unchanged over past few months - likely baseline at this point.  - Mental status felt to be baseline now and is 2/2 dementia with significant cerebral/cerebellar atrophy as per imagings and it appears to be progressive.  Seen by neurology here. Recommend outpatient follow-up.  No evidence of seizures.  Delirium precaution, fall precaution and continue supportive care. 1:1 sitter  ongoing - working on placement in memory care unit - d/c scopolamine patch on 04/26/22; likely not needed anymore   Bipolar disorder - Seen by psychiatry. Currently mood stable -Family has communicated that they do not plan on outpatient follow-up with psychiatry but are welcome to -Continue Haldol 1 mg twice daily - Continue Atarax and Ativan as needed - Continue Zyprexa PRN   AKI on CKD stage IIIa: Baseline creatinine 1.3.  Stable.  Checking labs weekly   Vitamin B12 deficiency: Resolved last B12 1195 on 12/11, discontinued B12 supplement   Sinus tachycardia - resolved  -Previously on propranolol, discontinued due to hypotension/bradycardia   Tobacco use: cont nicotine patch   Disposition: Prolonged hospitalization pending placement.  TOC following   Hyperkalemia: Resolved.  COVID infection - resolved  asymptomatic now, stable. On RA/afebrile. She had elevated D-dimer.CTA chest/lower extremity Doppler negative for VTE.   Old records reviewed in assessment of this patient   DVT prophylaxis:  enoxaparin (LOVENOX) injection 40 mg Start: 12/26/21 1200   Code Status:   Code Status: Full Code  Mobility Assessment (last 72 hours)     Mobility Assessment     Row Name 05/04/22 2100 05/04/22 0800 05/03/22 2005 05/02/22 2100 05/02/22 0800   Does patient have an order for bedrest or is patient medically unstable No - Continue assessment No - Continue assessment No - Continue assessment No - Continue assessment No - Continue assessment   What is the highest level of mobility based on the progressive mobility assessment? Level 6 (Walks independently in room and hall) - Balance while walking in room without assist - Complete Level 6 (Walks independently in room and hall) - Balance while walking in  room without assist - Complete Level 6 (Walks independently in room and hall) - Balance while walking in room without assist - Complete Level 6 (Walks independently in room and hall) - Balance  while walking in room without assist - Complete Level 6 (Walks independently in room and hall) - Balance while walking in room without assist - Complete            Barriers to discharge: difficult to place; awaiting memory care unit  Disposition Plan:  u/k Status is: Inpt  Objective: Blood pressure 119/76, pulse 69, temperature 97.7 F (36.5 C), temperature source Oral, resp. rate 16, height 5\' 6"  (1.676 m), weight 72 kg, SpO2 100 %.  Examination: Patient unable to consent for exam, thus full exam deferred  Physical Exam Constitutional:      General: [name] not in acute distress.    Appearance: [name] not ill-appearing or toxic-appearing.     Comments: Adult woman laying in bed in no distress  HENT:     Head: Normocephalic and atraumatic.     Mouth/Throat:     Mouth: Mucous membranes are moist.  Eyes:     Extraocular Movements: Extraocular movements intact.  Cardiovascular:     Rate and Rhythm: Normal rate and regular rhythm.  Pulmonary:     Effort: Pulmonary effort is normal. No respiratory distress.     Breath sounds: Normal breath sounds.  Abdominal:     General: There is no distension.     Palpations: Abdomen is soft.  Musculoskeletal:        General: No swelling.     Cervical back: Normal range of motion.  Skin:    General: Skin is warm and dry.  Neurological:     General: No focal deficit present.     Comments: Moves all 4 extremities easily, no obvious focal weakness/deficits     Consultants:    Procedures:    Data Reviewed: No results found for this or any previous visit (from the past 24 hour(s)).   I have Reviewed nursing notes, Vitals, and Lab results since pt's last encounter.  See above for any pertinent lab results I have ordered labwork to follow up on.  I have reviewed the last note from staff over past 24 hours I have discussed pt's care plan and test results with nursing staff, CM/SW, and other staff as appropriate    LOS: 130 days    Little Ishikawa, DO [facility] 05/05/2022, 7:27 AM

## 2022-05-06 NOTE — Progress Notes (Signed)
Mobility Specialist Progress Note:   05/06/22 1446  Mobility  Activity Dangled on edge of bed  Level of Assistance Independent  Assistive Device None  Activity Response Tolerated well  Mobility Referral Yes  $Mobility charge 1 Mobility   Pt received in bed and agreeable. Pt difficult to redirect from word search for session. Pt able to sit EOB but refused to stand or ambulate d/t wanting to continue word search. Pt returned to bed with all needs met, call bell in reach, and sitter in room.    Andrey Campanile Mobility Specialist Please contact via SecureChat or  Rehab office at [phone]

## 2022-05-06 NOTE — Progress Notes (Signed)
Mobility Specialist Progress Note:   05/06/22 0917  Mobility  Activity Ambulated independently in hallway  Level of Assistance Independent  Assistive Device None  Distance Ambulated (ft) 150 ft  Activity Response Tolerated well  Mobility Referral Yes  $Mobility charge 1 Mobility   Pt received in bed and agreeable. Pt more difficult to redirect today, walking only to return food tray and back to room. Asymptomatic throughout. Pt left in bed with all needs met, call bell in reach, and sitter in room.   Andrey Campanile Mobility Specialist Please contact via SecureChat or  Rehab office at [phone]

## 2022-05-06 NOTE — Progress Notes (Signed)
Progress Note    Natalie Brown   CWC:[id number]  DOB: 1966-03-01  DOA: 12/24/2021     131 PCP: Natalie Coots, NP  Initial CC: AMS  Hospital Course: [name] is a 57 yo female with PMH bipolar disorder, HTN, recent hospitalization at [facility] who presents to the hospital with altered mentation and tachycardia. Major events: 09/07/2021 admitted to behavioral health.  Treated with Zyprexa Invega and Ativan challenge for catatonia. 12/24/2021 sent to ER from [facility] for evaluation for tachycardia 9/04-9/5, 9/5>9/9, LTM EEG  no seizure. TSH stable CTA chest no PE no pneumonia RPR nonreactive bilateral LE Doppler negative for DVT Echo with normal EF  9/23 febrile secondary to Covid 19 + no pneumonia. 9/25 repeat CT brain unremarkable. She has had prolonged hospitalization seen by psychiatry.Per psychiatry does not meet criteria to go back to behavioral health, awaiting for placement to memory care unit. She remains medically stable for discharge, awaiting safe disposition -difficult placement given above.  Interval History:  Patient continues to be a flight risk, repeat episode yesterday where she attempted to leave the floor requiring multiple staff members as well as patient's significant other to return her to her room review of systems remains limited.  Assessment and Plan:  Acute encephalopathy - resolved, currently at baseline Hypoactive delirium Dementia, at baseline - Continues to be confused, poorly interacting non-communicative although appears to be alert and awake, feeding herself, ambulating and is unchanged over past few months - likely baseline at this point.  - Mental status felt to be baseline now and is 2/2 dementia with significant cerebral/cerebellar atrophy as per imagings and it appears to be progressive.  Seen by neurology here. Recommend outpatient follow-up.  No evidence of seizures.  Delirium precaution, fall precaution and continue supportive care. 1:1 sitter  ongoing - working on placement in memory care unit - d/c scopolamine patch on 04/26/22; likely not needed anymore   Bipolar disorder - Seen by psychiatry. Currently mood stable -Family has communicated that they do not plan on outpatient follow-up with psychiatry but are welcome to -Continue Haldol 1 mg twice daily - Continue Atarax and Ativan as needed - Continue Zyprexa PRN   AKI on CKD stage IIIa: Baseline creatinine 1.3.  Stable.  Checking labs weekly   Vitamin B12 deficiency: Resolved last B12 1195 on 12/11, discontinued B12 supplement   Sinus tachycardia - resolved  -Previously on propranolol, discontinued due to hypotension/bradycardia   Tobacco use: cont nicotine patch   Disposition: Prolonged hospitalization pending placement.  TOC following   Hyperkalemia: Resolved.  COVID infection - resolved  asymptomatic now, stable. On RA/afebrile. She had elevated D-dimer.CTA chest/lower extremity Doppler negative for VTE.   Old records reviewed in assessment of this patient   DVT prophylaxis:  enoxaparin (LOVENOX) injection 40 mg Start: 12/26/21 1200   Code Status:   Code Status: Full Code  Mobility Assessment (last 72 hours)     Mobility Assessment     Row Name 05/05/22 0800 05/04/22 2100 05/04/22 0800 05/03/22 2005     Does patient have an order for bedrest or is patient medically unstable No - Continue assessment No - Continue assessment No - Continue assessment No - Continue assessment    What is the highest level of mobility based on the progressive mobility assessment? Level 6 (Walks independently in room and hall) - Balance while walking in room without assist - Complete Level 6 (Walks independently in room and hall) - Balance while walking in room without assist -  Complete Level 6 (Walks independently in room and hall) - Balance while walking in room without assist - Complete Level 6 (Walks independently in room and hall) - Balance while walking in room without assist  - Complete             Barriers to discharge: difficult to place; awaiting memory care unit  Disposition Plan:  u/k Status is: Inpt  Objective: Blood pressure (!) 151/61, pulse 91, temperature 97.7 F (36.5 C), temperature source Oral, resp. rate 16, height 5\' 6"  (1.676 m), weight 72 kg, SpO2 93 %.  Examination: Patient unable to consent for exam, thus full exam deferred  Physical Exam Constitutional:      General: [name] not in acute distress.    Appearance: [name] not ill-appearing or toxic-appearing.     Comments: Adult woman laying in bed in no distress  HENT:     Head: Normocephalic and atraumatic.     Mouth/Throat:     Mouth: Mucous membranes are moist.  Eyes:     Extraocular Movements: Extraocular movements intact.  Cardiovascular:     Rate and Rhythm: Normal rate and regular rhythm.  Pulmonary:     Effort: Pulmonary effort is normal. No respiratory distress.     Breath sounds: Normal breath sounds.  Abdominal:     General: There is no distension.     Palpations: Abdomen is soft.  Musculoskeletal:        General: No swelling.     Cervical back: Normal range of motion.  Skin:    General: Skin is warm and dry.  Neurological:     General: No focal deficit present.     Comments: Moves all 4 extremities easily, no obvious focal weakness/deficits     Consultants:    Procedures:    Data Reviewed: No results found for this or any previous visit (from the past 24 hour(s)).   I have Reviewed nursing notes, Vitals, and Lab results since pt's last encounter.  See above for any pertinent lab results I have ordered labwork to follow up on.  I have reviewed the last note from staff over past 24 hours I have discussed pt's care plan and test results with nursing staff, CM/SW, and other staff as appropriate    LOS: 131 days   Little Ishikawa, DO [facility] 05/06/2022, 7:30 AM

## 2022-05-06 NOTE — Progress Notes (Addendum)
Patient setting bed exit off. Up wandering around. Patient taken back to her room by staff. PRN medications given. Bed exit on for safety.  0220: Patient bed alarm going off, upon entering the room, patient found standing in the middle of her bed. Patient verbally redirected. Patient back laying in bed with bed exit on.  0228: Patient bed exit going off. Patient out of her room. Patient pushing staff to get into the snack cabinets and refrigerator.

## 2022-05-07 NOTE — Progress Notes (Signed)
Progress Note    Natalie Brown   ZOX:[id number]  DOB: 07/08/1965  DOA: 12/24/2021     132 PCP: Marliss Coots, NP  Initial CC: AMS  Hospital Course: Natalie Brown is a 57 yo female with PMH bipolar disorder, HTN, recent hospitalization at [facility] who presents to the hospital with altered mentation and tachycardia. Major events: 09/07/2021 admitted to behavioral health.  Treated with Zyprexa Invega and Ativan challenge for catatonia. 12/24/2021 sent to ER from [facility] for evaluation for tachycardia 9/04-9/5, 9/5>9/9, LTM EEG  no seizure. TSH stable CTA chest no PE no pneumonia RPR nonreactive bilateral LE Doppler negative for DVT Echo with normal EF  9/23 febrile secondary to Covid 19 + no pneumonia. 9/25 repeat CT brain unremarkable. She has had prolonged hospitalization seen by psychiatry.Per psychiatry does not meet criteria to go back to behavioral health, awaiting for placement to memory care unit. She remains medically stable for discharge, awaiting safe disposition -difficult placement given above.  Interval History:  Patient continues to be a flight risk, repeat episode yesterday where she attempted to leave the floor requiring multiple staff members as well as patient's significant other to return her to her room review of systems remains limited.  Assessment and Plan:  Acute encephalopathy - resolved, currently at baseline Hypoactive delirium Dementia, at baseline - Continues to be confused, poorly interacting non-communicative although appears to be alert and awake, feeding herself, ambulating and is unchanged over past few months - likely baseline at this point.  - Mental status felt to be baseline now and is 2/2 dementia with significant cerebral/cerebellar atrophy as per imagings and it appears to be progressive.  Seen by neurology here. Recommend outpatient follow-up.  No evidence of seizures.  Delirium precaution, fall precaution and continue supportive care. 1:1 sitter  ongoing - working on placement in memory care unit - d/c scopolamine patch on 04/26/22; likely not needed anymore   Bipolar disorder - Seen by psychiatry. Currently mood stable -Family has communicated that they do not plan on outpatient follow-up with psychiatry but are welcome to -Continue Haldol 1 mg twice daily - Continue Atarax and Ativan as needed - Continue Zyprexa PRN   AKI on CKD stage IIIa: Baseline creatinine 1.3.  Stable.  Checking labs weekly   Vitamin B12 deficiency: Resolved last B12 1195 on 12/11, discontinued B12 supplement   Sinus tachycardia - resolved  -Previously on propranolol, discontinued due to hypotension/bradycardia   Tobacco use: cont nicotine patch   Disposition: Prolonged hospitalization pending placement.  TOC following   Hyperkalemia: Resolved.  COVID infection - resolved  asymptomatic now, stable. On RA/afebrile. She had elevated D-dimer.CTA chest/lower extremity Doppler negative for VTE.   Old records reviewed in assessment of this patient   DVT prophylaxis:  enoxaparin (LOVENOX) injection 40 mg Start: 12/26/21 1200   Code Status:   Code Status: Full Code  Mobility Assessment (last 72 hours)     Mobility Assessment     Row Name 05/05/22 0800 05/04/22 2100 05/04/22 0800       Does patient have an order for bedrest or is patient medically unstable No - Continue assessment No - Continue assessment No - Continue assessment     What is the highest level of mobility based on the progressive mobility assessment? Level 6 (Walks independently in room and hall) - Balance while walking in room without assist - Complete Level 6 (Walks independently in room and hall) - Balance while walking in room without assist - Complete Level  6 (Walks independently in room and hall) - Balance while walking in room without assist - Complete              Barriers to discharge: difficult to place; awaiting memory care unit  Disposition Plan:  u/k Status is:  Inpt  Objective: Blood pressure (!) 151/61, pulse 91, temperature 97.7 F (36.5 C), temperature source Oral, resp. rate 16, height 5\' 6"  (1.676 m), weight 72 kg, SpO2 93 %.  Examination: Patient unable to consent for exam, thus full exam deferred  Physical Exam Constitutional:      General: [name] not in acute distress.    Appearance: [name] not ill-appearing or toxic-appearing.     Comments: Adult woman laying in bed in no distress  HENT:     Head: Normocephalic and atraumatic.     Mouth/Throat:     Mouth: Mucous membranes are moist.  Eyes:     Extraocular Movements: Extraocular movements intact.  Cardiovascular:     Rate and Rhythm: Normal rate and regular rhythm.  Pulmonary:     Effort: Pulmonary effort is normal. No respiratory distress.     Breath sounds: Normal breath sounds.  Abdominal:     General: There is no distension.     Palpations: Abdomen is soft.  Musculoskeletal:        General: No swelling.     Cervical back: Normal range of motion.  Skin:    General: Skin is warm and dry.  Neurological:     General: No focal deficit present.     Comments: Moves all 4 extremities easily, no obvious focal weakness/deficits     Consultants:    Procedures:    Data Reviewed: No results found for this or any previous visit (from the past 24 hour(s)).   I have Reviewed nursing notes, Vitals, and Lab results since pt's last encounter.  See above for any pertinent lab results I have ordered labwork to follow up on.  I have reviewed the last note from staff over past 24 hours I have discussed pt's care plan and test results with nursing staff, CM/SW, and other staff as appropriate    LOS: 132 days   Little Ishikawa, DO [facility] 05/07/2022, 7:16 AM

## 2022-05-08 NOTE — Progress Notes (Signed)
Mobility Specialist Progress Note:   05/08/22 0921  Mobility  Activity Ambulated independently in hallway  Level of Assistance Independent  Assistive Device None  Distance Ambulated (ft) 200 ft  Activity Response Tolerated well  Mobility Referral Yes  $Mobility charge 1 Mobility   Pt received ambulating in hallway with food tray and RN staff attempting to redirect pt back to room. Pt difficult to redirect. Asymptomatic throughout. Pt left in bed with all needs met, call bell in reach, and sitter in room.   Andrey Campanile Mobility Specialist Please contact via SecureChat or  Rehab office at [phone]

## 2022-05-08 NOTE — Progress Notes (Signed)
Progress Note    Natalie Brown   PNT:[id number]  DOB: [birth year]  DOA: 12/24/2021     133 PCP: Marliss Coots, NP  Initial CC: AMS  Hospital Course: Natalie Brown is a 57 yo female with PMH bipolar disorder, HTN, recent hospitalization at [facility] who presents to the hospital with altered mentation and tachycardia. Major events: 09/07/2021 admitted to behavioral health.  Treated with Zyprexa Invega and Ativan challenge for catatonia. 12/24/2021 sent to ER from [name] for evaluation for tachycardia 9/04-9/5, 9/5>9/9, LTM EEG  no seizure. TSH stable CTA chest no PE no pneumonia RPR nonreactive bilateral LE Doppler negative for DVT Echo with normal EF  9/23 febrile secondary to Covid 19 + no pneumonia. 9/25 repeat CT brain unremarkable. She has had prolonged hospitalization seen by psychiatry.Per psychiatry does not meet criteria to go back to behavioral health, awaiting for placement to memory care unit. She remains medically stable for discharge, awaiting safe disposition -difficult placement given above.  Interval History:  Patient continues to be a flight risk, repeat episode yesterday where she attempted to leave the floor requiring multiple staff members as well as patient's significant other to return her to her room review of systems remains limited.  Assessment and Plan:  Acute encephalopathy - resolved, currently at baseline Hypoactive delirium Dementia, at baseline - Continues to be confused, poorly interacting non-communicative although appears to be alert and awake, feeding herself, ambulating and is unchanged over past few months - likely baseline at this point.  - Mental status felt to be baseline now and is 2/2 dementia with significant cerebral/cerebellar atrophy as per imagings and it appears to be progressive.  Seen by neurology here. Recommend outpatient follow-up.  No evidence of seizures.  Delirium precaution, fall precaution and continue supportive care. 1:1 sitter  ongoing - working on placement in memory care unit - d/c scopolamine patch on 04/26/22; likely not needed anymore   Bipolar disorder - Seen by psychiatry. Currently mood stable -Family has communicated that they do not plan on outpatient follow-up with psychiatry but are welcome to -Continue Haldol 1 mg twice daily - Continue Atarax and Ativan as needed - Continue Zyprexa PRN   AKI on CKD stage IIIa: Baseline creatinine 1.3.  Stable.  Checking labs weekly   Vitamin B12 deficiency: Resolved last B12 1195 on 12/11, discontinued B12 supplement   Sinus tachycardia - resolved  -Previously on propranolol, discontinued due to hypotension/bradycardia   Tobacco use: cont nicotine patch   Disposition: Prolonged hospitalization pending placement.  TOC following   Hyperkalemia: Resolved.  COVID infection - resolved  asymptomatic now, stable. On RA/afebrile. She had elevated D-dimer.CTA chest/lower extremity Doppler negative for VTE.   Old records reviewed in assessment of this patient   DVT prophylaxis:  enoxaparin (LOVENOX) injection 40 mg Start: 12/26/21 1200   Code Status:   Code Status: Full Code  Mobility Assessment (last 72 hours)     Mobility Assessment     Row Name 05/05/22 0800           Does patient have an order for bedrest or is patient medically unstable No - Continue assessment       What is the highest level of mobility based on the progressive mobility assessment? Level 6 (Walks independently in room and hall) - Balance while walking in room without assist - Complete                Barriers to discharge: difficult to place; awaiting memory care unit  Disposition Plan:  u/k Status is: Inpt  Objective: Blood pressure 135/84, pulse 78, temperature 98.3 F (36.8 C), temperature source Oral, resp. rate 13, height 5\' 6"  (1.676 m), weight 72 kg, SpO2 98 %.  Examination: Patient unable to consent for exam, thus full exam deferred  Physical  Exam Constitutional:      General: [name] not in acute distress.    Appearance: [name] not ill-appearing or toxic-appearing.     Comments: Adult woman laying in bed in no distress  HENT:     Head: Normocephalic and atraumatic.     Mouth/Throat:     Mouth: Mucous membranes are moist.  Eyes:     Extraocular Movements: Extraocular movements intact.  Cardiovascular:     Rate and Rhythm: Normal rate and regular rhythm.  Pulmonary:     Effort: Pulmonary effort is normal. No respiratory distress.     Breath sounds: Normal breath sounds.  Abdominal:     General: There is no distension.     Palpations: Abdomen is soft.  Musculoskeletal:        General: No swelling.     Cervical back: Normal range of motion.  Skin:    General: Skin is warm and dry.  Neurological:     General: No focal deficit present.     Comments: Moves all 4 extremities easily, no obvious focal weakness/deficits     Consultants:    Procedures:    Data Reviewed: No results found for this or any previous visit (from the past 24 hour(s)).   I have Reviewed nursing notes, Vitals, and Lab results since pt's last encounter.  See above for any pertinent lab results I have ordered labwork to follow up on.  I have reviewed the last note from staff over past 24 hours I have discussed pt's care plan and test results with nursing staff, CM/SW, and other staff as appropriate    LOS: 133 days   Little Ishikawa, DO [facility] 05/08/2022, 7:15 AM

## 2022-05-08 NOTE — Progress Notes (Signed)
Mobility Specialist Progress Note:   05/08/22 1033  Mobility  Activity Ambulated independently in hallway  Level of Assistance Independent  Assistive Device None  Distance Ambulated (ft) 200 ft  Activity Response Tolerated well  Mobility Referral Yes  $Mobility charge 1 Mobility   Pt received ambulating in hall. Difficult to redirect back to room, required assistance of sitter. Asymptomatic throughout. Pt left ambulating in room with sitter and all needs met.   Natalie Brown Mobility Specialist Please contact via SecureChat or  Rehab office at [phone]

## 2022-05-09 NOTE — Progress Notes (Signed)
Progress Note    Natalie Brown   [id number]:[id number]  DOB: 1966/03/26  DOA: 12/24/2021     134 PCP: Marliss Coots, NP  Initial CC: AMS  Hospital Course: Natalie Brown is a 57 yo female with PMH bipolar disorder, HTN, recent hospitalization at [facility] who presents to the hospital with altered mentation and tachycardia. Major events: 09/07/2021 admitted to behavioral health.  Treated with Zyprexa Invega and Ativan challenge for catatonia. 12/24/2021 sent to ER from [facility] for evaluation for tachycardia 9/04-9/5, 9/5>9/9, LTM EEG  no seizure. TSH stable CTA chest no PE no pneumonia RPR nonreactive bilateral LE Doppler negative for DVT Echo with normal EF  9/23 febrile secondary to Covid 19 + no pneumonia. 9/25 repeat CT brain unremarkable. She has had prolonged hospitalization seen by psychiatry.Per psychiatry does not meet criteria to go back to behavioral health, awaiting for placement to memory care unit. She remains medically stable for discharge, awaiting safe disposition -difficult placement given above.  Interval History:  Patient continues to be a flight risk, ROS otherwise difficult to obtain.  Assessment and Plan:  Acute encephalopathy - resolved, currently at baseline Hypoactive delirium Dementia, at baseline - Continues to be confused, poorly interacting non-communicative although appears to be alert and awake, feeding herself, ambulating and is unchanged over past few months - likely baseline at this point.  - Mental status felt to be baseline now and is 2/2 dementia with significant cerebral/cerebellar atrophy as per imagings and it appears to be progressive.  Seen by neurology here. Recommend outpatient follow-up.  No evidence of seizures.  Delirium precaution, fall precaution and continue supportive care. 1:1 sitter ongoing - working on placement in memory care unit - d/c scopolamine patch on 04/26/22; likely not needed anymore   Bipolar disorder - Seen by psychiatry. Currently  mood stable -Family has communicated that they do not plan on outpatient follow-up with psychiatry but are welcome to -Continue Haldol 1 mg twice daily - Continue Atarax and Ativan as needed - Continue Zyprexa PRN   AKI on CKD stage IIIa: Baseline creatinine 1.3.  Stable.  Checking labs weekly   Vitamin B12 deficiency: Resolved last B12 1195 on 12/11, discontinued B12 supplement   Sinus tachycardia - resolved  -Previously on propranolol, discontinued due to hypotension/bradycardia   Tobacco use: cont nicotine patch   Disposition: Prolonged hospitalization pending placement.  TOC following   Hyperkalemia: Resolved.  COVID infection - resolved  asymptomatic now, stable. On RA/afebrile. She had elevated D-dimer.CTA chest/lower extremity Doppler negative for VTE.   Old records reviewed in assessment of this patient   DVT prophylaxis:  enoxaparin (LOVENOX) injection 40 mg Start: 12/26/21 1200   Code Status:   Code Status: Full Code  Mobility Assessment (last 72 hours)     Mobility Assessment     Row Name 05/08/22 2030           Does patient have an order for bedrest or is patient medically unstable No - Continue assessment                Barriers to discharge: difficult to place; awaiting memory care unit  Disposition Plan:  u/k Status is: Inpt  Objective: Blood pressure 121/79, pulse 98, temperature 98.7 F (37.1 C), temperature source Oral, resp. rate 17, height 5\' 6"  (1.676 m), weight 72 kg, SpO2 98 %.  Examination: Patient unable to consent for exam, thus full exam deferred  Physical Exam Constitutional:      General: [name] not in  acute distress.    Appearance: [name] not ill-appearing or toxic-appearing.     Comments: Adult woman laying in bed in no distress  HENT:     Head: Normocephalic and atraumatic.     Mouth/Throat:     Mouth: Mucous membranes are moist.  Eyes:     Extraocular Movements: Extraocular movements intact.  Cardiovascular:      Rate and Rhythm: Normal rate and regular rhythm.  Pulmonary:     Effort: Pulmonary effort is normal. No respiratory distress.     Breath sounds: Normal breath sounds.  Abdominal:     General: There is no distension.     Palpations: Abdomen is soft.  Musculoskeletal:        General: No swelling.     Cervical back: Normal range of motion.  Skin:    General: Skin is warm and dry.  Neurological:     General: No focal deficit present.     Comments: Moves all 4 extremities easily, no obvious focal weakness/deficits     Consultants:    Procedures:    Data Reviewed: No results found for this or any previous visit (from the past 24 hour(s)).   I have Reviewed nursing notes, Vitals, and Lab results since pt's last encounter.  See above for any pertinent lab results I have ordered labwork to follow up on.  I have reviewed the last note from staff over past 24 hours I have discussed pt's care plan and test results with nursing staff, CM/SW, and other staff as appropriate    LOS: 134 days   Little Ishikawa, DO [facility] 05/09/2022, 7:27 AM

## 2022-05-09 NOTE — Progress Notes (Signed)
CSW spoke with [facility] Director to request contact be made with leadership at [facility] office to determine next steps for obtaining disability funds for the patient.  Madilyn Fireman, MSW, LCSW Transitions of Care  Clinical Social Worker II [phone]

## 2022-05-10 NOTE — Progress Notes (Signed)
Mobility Specialist Progress Note:   05/10/22 1514  Mobility  Activity Ambulated independently in hallway  Level of Assistance Independent  Assistive Device None  Distance Ambulated (ft) 300 ft  Activity Response Tolerated well;RN notified  Mobility Referral Yes  $Mobility charge 1 Mobility   Pt received in hallway with food tray in hands and RN staff attempting to redirect patient. Asymptomatic throughout. Pt difficult to redirect back to room, requiring assistance of multiple RN staff as pt kept walking towards exit doors of unit. Pt guided back to room and returned with all needs met and call bell in reach.   Andrey Campanile Mobility Specialist Please contact via SecureChat or  Rehab office at [phone]

## 2022-05-10 NOTE — Progress Notes (Signed)
Mobility Specialist Progress Note:   05/10/22 1153  Mobility  Activity Ambulated independently in room  Level of Assistance Independent  Assistive Device None  Distance Ambulated (ft) 150 ft  Activity Response Tolerated well  Mobility Referral Yes  $Mobility charge 1 Mobility   Pt received ambulating in room and agreeable. Asymptomatic throughout. Pt left in bed with all needs met, call bell in reach, and sitter in room.   Andrey Campanile Mobility Specialist Please contact via SecureChat or  Rehab office at [phone]

## 2022-05-10 NOTE — Progress Notes (Signed)
Progress Note    Natalie Brown   ION:[id number]  DOB: 14-Jun-1965  DOA: 12/24/2021     135 PCP: Marliss Coots, NP  Initial CC: AMS  Hospital Course: Natalie Brown is a 57 yo female with PMH bipolar disorder, HTN, recent hospitalization at [facility] who presents to the hospital with acute agitation and behavioral disturbances.  Major events: 09/07/2021 admitted to behavioral health.  Treated with Zyprexa Invega and Ativan challenge for catatonia. 12/24/2021 sent to ER from [facility] for evaluation for tachycardia 9/04-9/5, 9/5>9/9, LTM EEG  no seizure. TSH stable CTA chest no PE no pneumonia RPR nonreactive bilateral LE Doppler negative for DVT Echo with normal EF  9/23 febrile secondary to Covid 19 + no pneumonia. 9/25 repeat CT brain unremarkable. She has had prolonged hospitalization seen by psychiatry.Per psychiatry does not meet criteria to go back to behavioral health, awaiting for placement to memory care unit. Patient is medically stable for discharge, awaiting safe disposition -difficult placement given above.  Interval History:  Patient continues to be a flight risk, ROS otherwise difficult to obtain.  Assessment and Plan:  Acute metabolic encephalopathy - resolved Chronic Progressive Dementia: - Mental status is at baseline.  [name] alert and awake but non-communicative.  [name] able to feed herself and ambulate. - Continue 1:1 sitter.  Bipolar disorder: Mood is stable. - Continue Haldol 1 mg twice daily - Continue Atarax and Ativan as needed - Continue Zyprexa PRN   AKI on CKD stage IIIa: Baseline creatinine 1.3.  Stable.  Checking labs weekly   Vitamin B12 deficiency: Resolved last B12 1195 on 12/11, discontinued B12 supplement   Sinus tachycardia - resolved  -Previously on propranolol, discontinued due to hypotension/bradycardia   Tobacco use: cont nicotine patch   Disposition: Prolonged hospitalization pending placement.  TOC following   Hyperkalemia:  Resolved.  COVID infection - resolved  asymptomatic now, stable. On RA/afebrile. She had elevated D-dimer.CTA chest/lower extremity Doppler negative for VTE.  Old records reviewed in assessment of this patient   DVT prophylaxis:  enoxaparin (LOVENOX) injection 40 mg Start: 12/26/21 1200   Code Status:   Code Status: Full Code  Mobility Assessment (last 72 hours)     Mobility Assessment     Row Name 05/10/22 0800 05/09/22 2215 05/09/22 0849 05/08/22 2030     Does patient have an order for bedrest or is patient medically unstable No - Continue assessment No - Continue assessment No - Continue assessment No - Continue assessment    What is the highest level of mobility based on the progressive mobility assessment? Level 6 (Walks independently in room and hall) - Balance while walking in room without assist - Complete -- Level 6 (Walks independently in room and hall) - Balance while walking in room without assist - Complete --             Barriers to discharge: difficult to place; awaiting memory care unit  Disposition Plan:  u/k Status is: Inpt  Objective: Blood pressure (!) 140/75, pulse 100, temperature 98.8 F (37.1 C), temperature source Oral, resp. rate 20, height 5\' 6"  (1.676 m), weight 72 kg, SpO2 100 %.  Examination: Patient unable to consent for exam, thus full exam deferred  Physical Exam Constitutional:      General: [name] not in acute distress.    Appearance: [name] not ill-appearing or toxic-appearing.     Comments: Adult woman laying in bed in no distress  HENT:     Head: Normocephalic  and atraumatic.     Mouth/Throat:     Mouth: Mucous membranes are moist.  Eyes:     Extraocular Movements: Extraocular movements intact.  Cardiovascular:     Rate and Rhythm: Normal rate and regular rhythm.  Pulmonary:     Effort: Pulmonary effort is normal. No respiratory distress.     Breath sounds: Normal breath sounds.  Abdominal:     General: There is no  distension.     Palpations: Abdomen is soft.  Musculoskeletal:        General: No swelling.     Cervical back: Normal range of motion.  Skin:    General: Skin is warm and dry.  Neurological:     General: No focal deficit present.     Comments: Moves all 4 extremities easily, no obvious focal weakness/deficits     Consultants:    Procedures:    Data Reviewed: No results found for this or any previous visit (from the past 24 hour(s)).   I have Reviewed nursing notes, Vitals, and Lab results since pt's last encounter.  See above for any pertinent lab results I have ordered labwork to follow up on.  I have reviewed the last note from staff over past 24 hours I have discussed pt's care plan and test results with nursing staff, CM/SW, and other staff as appropriate    LOS: 135 days   Natalie Hugh, MD [facility] 05/10/2022, 6:42 PM

## 2022-05-11 NOTE — Plan of Care (Signed)

## 2022-05-11 NOTE — Progress Notes (Signed)
Mobility Specialist Progress Note:   05/11/22 1650  Mobility  Activity Dangled on edge of bed  Level of Assistance Contact guard assist, steadying assist  Assistive Device None  Activity Response Tolerated fair  Mobility Referral Yes  $Mobility charge 1 Mobility   Pt received in bed and difficult to arouse from sleep. Pt sat EOB before laying back down and going to sleep. Pt fatigued throughout session. Pt returned to supine with all needs met, call bell in reach, and sitter in room.   Andrey Campanile Mobility Specialist Please contact via SecureChat or  Rehab office at [phone]

## 2022-05-11 NOTE — Progress Notes (Addendum)
Progress Note    GINAMARIE BANFIELD   FAO:[id number]  DOB: 1966/02/25  DOA: 12/24/2021     136 PCP: Marliss Coots, NP  Initial CC: AMS  Hospital Course: Ms. Garduno is a 57 yo female with a PMH of Bipolar Affective Disorder and Chronic Dementia with Advanced Cerebral Atrophy and behavioral disturbances who was admitted on 09/07/21 with acute mania and aggressive behavior.  Psychiatry and Neurology evaluated patient during her stay.  Patient is medically stable for discharge and is awaiting placement at a memory care facility.  Major events: 09/07/2021 admitted to behavioral health.  Treated with Zyprexa Invega and Ativan challenge for catatonia. 12/24/2021 sent to ER from [facility] for evaluation for tachycardia 9/04-9/5, 9/5>9/9, LTM EEG  no seizure. TSH stable CTA chest no PE no pneumonia RPR nonreactive bilateral LE Doppler negative for DVT Echo with normal EF  9/23 febrile secondary to Covid 19 + no pneumonia. 9/25 repeat CT brain unremarkable.  Interval History:  Patient continues to be a flight risk, ROS otherwise difficult to obtain.  Assessment and Plan:  Acute metabolic encephalopathy - resolved Chronic Progressive Dementia: - Mental status is at baseline.  [name] alert and awake but non-communicative.  [name] able to feed herself and ambulate. - Continue 1:1 sitter.  Bipolar disorder: Mood is stable. - Continue Haldol 1 mg twice daily - Continue Atarax and Ativan as needed - Continue Zyprexa PRN  Hypertension: - BP is stable.   Chronic Kidney Disease Stage 3:  - Creatinine is stable. - Check labs weekly.   Vitamin B12 deficiency: Resolved last B12 1195 on 12/11, discontinued B12 supplement   Sinus tachycardia - resolved  -Previously on propranolol, discontinued due to hypotension/bradycardia   Tobacco use: cont nicotine patch   Disposition: Prolonged hospitalization pending placement.  TOC following  COVID infection - resolved  asymptomatic now, stable. On  RA/afebrile. She had elevated D-dimer.CTA chest/lower extremity Doppler negative for VTE.  Old records reviewed in assessment of this patient   DVT prophylaxis:  enoxaparin (LOVENOX) injection 40 mg Start: 12/26/21 1200   Code Status:   Code Status: Full Code  Mobility Assessment (last 72 hours)     Mobility Assessment     Row Name 05/11/22 1100 05/10/22 2100 05/10/22 0800 05/09/22 2215 05/09/22 0849   Does patient have an order for bedrest or is patient medically unstable No - Continue assessment No - Continue assessment No - Continue assessment No - Continue assessment No - Continue assessment   What is the highest level of mobility based on the progressive mobility assessment? Level 6 (Walks independently in room and hall) - Balance while walking in room without assist - Complete Level 6 (Walks independently in room and hall) - Balance while walking in room without assist - Complete Level 6 (Walks independently in room and hall) - Balance while walking in room without assist - Complete -- Level 6 (Walks independently in room and hall) - Balance while walking in room without assist - Complete    Row Name 05/08/22 2030           Does patient have an order for bedrest or is patient medically unstable No - Continue assessment                Barriers to discharge: difficult to place; awaiting memory care unit  Disposition Plan:  u/k Status is: Inpt  Objective: Blood pressure 123/73, pulse 68, temperature 98.8 F (37.1 C), temperature source Oral, resp. rate 17, height  5\' 6"  (1.676 m), weight 72 kg, SpO2 100 %.  Examination: Patient unable to consent for exam, thus full exam deferred  Physical Exam Constitutional:      General: [name] not in acute distress.    Appearance: [name] not ill-appearing or toxic-appearing.     Comments: Adult woman laying in bed in no distress  HENT:     Head: Normocephalic and atraumatic.     Mouth/Throat:     Mouth: Mucous membranes are  moist.  Eyes:     Extraocular Movements: Extraocular movements intact.  Cardiovascular:     Rate and Rhythm: Normal rate and regular rhythm.  Pulmonary:     Effort: Pulmonary effort is normal. No respiratory distress.     Breath sounds: Normal breath sounds.  Abdominal:     General: There is no distension.     Palpations: Abdomen is soft.  Musculoskeletal:        General: No swelling.     Cervical back: Normal range of motion.  Skin:    General: Skin is warm and dry.  Neurological:     General: No focal deficit present.     Comments: Moves all 4 extremities easily, no obvious focal weakness/deficits     Consultants:    Procedures:    Data Reviewed: No results found for this or any previous visit (from the past 24 hour(s)).   I have Reviewed nursing notes, Vitals, and Lab results since pt's last encounter.  See above for any pertinent lab results I have ordered labwork to follow up on.  I have reviewed the last note from staff over past 24 hours I have discussed pt's care plan and test results with nursing staff, CM/SW, and other staff as appropriate    LOS: 136 days   George Hugh, MD [facility] 05/11/2022, 5:15 PM

## 2022-05-11 NOTE — Progress Notes (Signed)
Mobility Specialist Progress Note:   05/11/22 1029  Mobility  Activity Ambulated independently in hallway  Level of Assistance Independent  Assistive Device None  Distance Ambulated (ft) 200 ft  Activity Response Tolerated well  Mobility Referral Yes  $Mobility charge 1 Mobility   Pt received ambulating in hallway and agreeable. Asymptomatic throughout. Easily redirected back to room with assistance from RN staff. Pt left ambulating in room with all needs met, call bell in reach, and sitter in room.   Andrey Campanile Mobility Specialist Please contact via SecureChat or  Rehab office at [phone]

## 2022-05-12 NOTE — Progress Notes (Signed)
Progress Note    Natalie Brown   GXQ:[id number]  DOB: [birth year]  DOA: 12/24/2021     137 PCP: Marliss Coots, NP  Initial CC: AMS  Hospital Course: Natalie Brown is a 57 yo female with a PMH of Bipolar Affective Disorder and Chronic Dementia with Advanced Cerebral Atrophy and Behavioral Disturbances who was admitted on 09/07/21 with acute mania and aggressive behavior.  Psychiatry and Neurology evaluated patient during her stay.  Patient is medically stable for discharge and is awaiting placement at a memory care facility.  Major events: 09/07/2021 admitted to behavioral health.  Treated with Zyprexa Invega and Ativan challenge for catatonia. 12/24/2021 sent to ER from [facility] for evaluation for tachycardia 9/04-9/5, 9/5>9/9, LTM EEG  no seizure. TSH stable CTA chest no PE no pneumonia RPR nonreactive bilateral LE Doppler negative for DVT Echo with normal EF  9/23 febrile secondary to Covid 19 + no pneumonia. 9/25 repeat CT brain unremarkable.  Interval History:  Patient continues to be a flight risk, ROS otherwise difficult to obtain.  Subjective: Natalie Brown is doing well. [name] more active this morning. [name] walking around the halls frequently throughout the day.  Assessment and Plan:  Acute metabolic encephalopathy - resolved Chronic Progressive Dementia: - Mental status is at baseline.  [name] alert and awake but non-communicative.  [name] ambulating the halls throughout the day.  [name] able to feed herself. - Continue 1:1 sitter for now.  Bipolar disorder: Mood is stable. - Continue Haldol 1 mg twice daily - Continue Atarax and Ativan as needed - Continue Zyprexa PRN  Hypertension: - BP is stable.   Chronic Kidney Disease Stage 3:  - Creatinine is stable. - Check labs weekly.   Vitamin B12 deficiency: Resolved last B12 1195 on 12/11, discontinued B12 supplement   Sinus tachycardia - resolved  -Previously on propranolol, discontinued due to  hypotension/bradycardia   Tobacco use: cont nicotine patch   Disposition: Prolonged hospitalization pending placement.  TOC following  COVID infection - resolved   Old records reviewed in assessment of this patient   DVT prophylaxis:  enoxaparin (LOVENOX) injection 40 mg Start: 12/26/21 1200   Code Status:   Code Status: Full Code  Mobility Assessment (last 72 hours)     Mobility Assessment     Row Name 05/12/22 1111 05/11/22 2152 05/11/22 1100 05/10/22 2100 05/10/22 0800   Does patient have an order for bedrest or is patient medically unstable No - Continue assessment No - Continue assessment No - Continue assessment No - Continue assessment No - Continue assessment   What is the highest level of mobility based on the progressive mobility assessment? Level 6 (Walks independently in room and hall) - Balance while walking in room without assist - Complete Level 6 (Walks independently in room and hall) - Balance while walking in room without assist - Complete Level 6 (Walks independently in room and hall) - Balance while walking in room without assist - Complete Level 6 (Walks independently in room and hall) - Balance while walking in room without assist - Complete Level 6 (Walks independently in room and hall) - Balance while walking in room without assist - Complete    Row Name 05/09/22 2215           Does patient have an order for bedrest or is patient medically unstable No - Continue assessment                Barriers to discharge:  difficult to place; awaiting memory care unit  Disposition Plan:  u/k Status is: Inpt  Objective: Blood pressure (!) 141/128, pulse 99, temperature 98.6 F (37 C), temperature source Oral, resp. rate 18, height 5\' 6"  (1.676 m), weight 72 kg, SpO2 100 %.  Examination: Patient unable to consent for exam, thus full exam deferred  Physical Exam Constitutional:      General: [name] not in acute distress.    Appearance: [name] not  ill-appearing or toxic-appearing.     Comments: Adult woman laying in bed in no distress  HENT:     Head: Normocephalic and atraumatic.     Mouth/Throat:     Mouth: Mucous membranes are moist.  Eyes:     Extraocular Movements: Extraocular movements intact.  Cardiovascular:     Rate and Rhythm: Normal rate and regular rhythm.  Pulmonary:     Effort: Pulmonary effort is normal. No respiratory distress.     Breath sounds: Normal breath sounds.  Abdominal:     General: There is no distension.     Palpations: Abdomen is soft.  Musculoskeletal:        General: No swelling.     Cervical back: Normal range of motion.  Skin:    General: Skin is warm and dry.  Neurological:     General: No focal deficit present.     Comments: Moves all 4 extremities easily, no obvious focal weakness/deficits    Data Reviewed: No results found for this or any previous visit (from the past 24 hour(s)).   I have Reviewed nursing notes, Vitals, and Lab results since pt's last encounter.  See above for any pertinent lab results I have ordered labwork to follow up on.  I have reviewed the last note from staff over past 24 hours I have discussed pt's care plan and test results with nursing staff, CM/SW, and other staff as appropriate    LOS: 137 days   George Hugh, MD [facility] 05/12/2022, 6:08 PM

## 2022-05-12 NOTE — Plan of Care (Signed)

## 2022-05-12 NOTE — Progress Notes (Signed)
Mobility Specialist Progress Note:   05/12/22 1125  Mobility  Activity Ambulated independently in hallway  Level of Assistance Independent  Assistive Device None  Distance Ambulated (ft) 600 ft  Activity Response Tolerated well  Mobility Referral Yes  $Mobility charge 1 Mobility   Pt received ambulating in hallway with RN staff and agreeable. Pt asymptomatic throughout. Pt returned to room with all needs met, call bell in reach, and sitter in room.   Andrey Campanile Mobility Specialist Please contact via SecureChat or  Rehab office at [phone]

## 2022-05-13 LAB — CBC WITH DIFFERENTIAL/PLATELET
Abs Immature Granulocytes: 0.01 10*3/uL (ref 0.00–0.07)
Basophils Absolute: 0 10*3/uL (ref 0.0–0.1)
Basophils Relative: 1 %
Eosinophils Absolute: 0.1 10*3/uL (ref 0.0–0.5)
Eosinophils Relative: 2 %
HCT: 43.6 % (ref 36.0–46.0)
Hemoglobin: 14.6 g/dL (ref 12.0–15.0)
Immature Granulocytes: 0 %
Lymphocytes Relative: 29 %
Lymphs Abs: 0.9 10*3/uL (ref 0.7–4.0)
MCH: 29.5 pg (ref 26.0–34.0)
MCHC: 33.5 g/dL (ref 30.0–36.0)
MCV: 88.1 fL (ref 80.0–100.0)
Monocytes Absolute: 0.3 10*3/uL (ref 0.1–1.0)
Monocytes Relative: 10 %
Neutro Abs: 1.8 10*3/uL (ref 1.7–7.7)
Neutrophils Relative %: 58 %
Platelets: 260 10*3/uL (ref 150–400)
RBC: 4.95 MIL/uL (ref 3.87–5.11)
RDW: 13.3 % (ref 11.5–15.5)
WBC: 3.1 10*3/uL — ABNORMAL LOW (ref 4.0–10.5)
nRBC: 0 % (ref 0.0–0.2)

## 2022-05-13 LAB — BASIC METABOLIC PANEL
Anion gap: 8 (ref 5–15)
BUN: 18 mg/dL (ref 6–20)
CO2: 23 mmol/L (ref 22–32)
Calcium: 9 mg/dL (ref 8.9–10.3)
Chloride: 107 mmol/L (ref 98–111)
Creatinine, Ser: 1.14 mg/dL — ABNORMAL HIGH (ref 0.44–1.00)
GFR, Estimated: 57 mL/min — ABNORMAL LOW (ref >60)
Glucose, Bld: 78 mg/dL (ref 70–99)
Potassium: 3.8 mmol/L (ref 3.5–5.1)
Sodium: 138 mmol/L (ref 135–145)

## 2022-05-13 LAB — MAGNESIUM: Magnesium: 2.2 mg/dL (ref 1.7–2.4)

## 2022-05-13 MED ORDER — VITAMIN B-12 1000 MCG PO TABS
500.0000 ug | ORAL_TABLET | Freq: Every day | ORAL | Status: DC
  Administered 2022-05-14 – 2023-01-22 (×242): 500 ug via ORAL
  Filled 2022-05-13 (×250): qty 1

## 2022-05-13 NOTE — Progress Notes (Signed)
[facility] PROGRESS NOTE  Natalie Brown (DOB: [birth year]) [id number]:[id number] PCP: Natalie Coots, NP  Brief Narrative: Natalie Brown is a 57 yo female with a PMH of Bipolar Affective Disorder and Chronic Dementia with Advanced Cerebral Atrophy and Behavioral Disturbances who was admitted on 09/07/21 with acute mania and aggressive behavior.  Psychiatry and Neurology evaluated patient during her stay.  Patient is medically stable for discharge and is awaiting placement at a memory care facility.   Major events: 09/07/2021 admitted to behavioral health.  Treated with Zyprexa Invega and Ativan challenge for catatonia. 12/24/2021 sent to ER from [facility] for evaluation for tachycardia 9/04-9/5, 9/5>9/9, LTM EEG  no seizure. TSH stable CTA chest no PE no pneumonia RPR nonreactive bilateral LE Doppler negative for DVT Echo with normal EF  9/23 febrile secondary to Covid 19 + no pneumonia. 9/25 repeat CT brain unremarkable.  Subjective: No complaints or overnight events  Objective: BP 123/72 (BP Location: Left Arm)   Pulse 62   Temp 98 F (36.7 C)   Resp 16   Ht 5\' 6"  (1.676 m)   Wt 72 kg   SpO2 95%   BMI 25.62 kg/m   Gen: No distress Pulm: Clear, nonlabored  CV: RRR, no MRG or edema GI: Soft, NT, ND, +BS  Neuro: Not oriented or cooperative this AM Ext: Warm, no deformities Skin: No rashes, lesions or ulcers on visualized skin   Assessment & Plan: Acute metabolic encephalopathy on chronic progressive dementia: AMS has resolved, pt at cognitive baseline. [name] alert and awake but non-communicative. [name] ambulating the halls throughout the day. [name] able to feed herself. - Continue 1:1 sitter as she remains a flight risk.   Bipolar disorder: Mood is stable. - Continue haldol 1mg  BID - Continue 5mg  q8h prn agitation, has not required this lately. - Continue hydroxyzine 25mg  TID prn anxiety - Can also give ativan 0.5mg  po q6h prn anxiety/agitation.    Hypertension: - BP is  stable.   Stage IIIa CKD: Stable.  - Check labs intermittently.   Vitamin B12 deficiency: Resolved last B12 1195 on 12/11 - Continue oral supp   Sinus tachycardia: Resolved. Previously on propranolol, discontinued due to hypotension/bradycardia   Tobacco use: - Continue nicotine patch   Disposition: Prolonged hospitalization pending placement.  TOC following   COVID infection: Resolved.  Patrecia Pour, MD [facility] [email or web address] 05/13/2022, 1:19 PM

## 2022-05-14 ENCOUNTER — Inpatient Hospital Stay (HOSPITAL_COMMUNITY): Payer: MEDICAID

## 2022-05-14 DIAGNOSIS — F03918 Unspecified dementia, unspecified severity, with other behavioral disturbance: Secondary | ICD-10-CM

## 2022-05-14 LAB — CBC
HCT: 45.2 % (ref 36.0–46.0)
Hemoglobin: 15.4 g/dL — ABNORMAL HIGH (ref 12.0–15.0)
MCH: 29.6 pg (ref 26.0–34.0)
MCHC: 34.1 g/dL (ref 30.0–36.0)
MCV: 86.8 fL (ref 80.0–100.0)
Platelets: 259 10*3/uL (ref 150–400)
RBC: 5.21 MIL/uL — ABNORMAL HIGH (ref 3.87–5.11)
RDW: 13.4 % (ref 11.5–15.5)
WBC: 8.7 10*3/uL (ref 4.0–10.5)
nRBC: 0 % (ref 0.0–0.2)

## 2022-05-14 LAB — RESPIRATORY PANEL BY PCR

## 2022-05-14 LAB — COMPREHENSIVE METABOLIC PANEL
ALT: 20 U/L (ref 0–44)
AST: 21 U/L (ref 15–41)
Albumin: 3.6 g/dL (ref 3.5–5.0)
Alkaline Phosphatase: 53 U/L (ref 38–126)
Anion gap: 8 (ref 5–15)
BUN: 13 mg/dL (ref 6–20)
CO2: 27 mmol/L (ref 22–32)
Calcium: 8.9 mg/dL (ref 8.9–10.3)
Chloride: 102 mmol/L (ref 98–111)
Creatinine, Ser: 1.22 mg/dL — ABNORMAL HIGH (ref 0.44–1.00)
GFR, Estimated: 53 mL/min — ABNORMAL LOW (ref >60)
Glucose, Bld: 96 mg/dL (ref 70–99)
Potassium: 4.9 mmol/L (ref 3.5–5.1)
Sodium: 137 mmol/L (ref 135–145)
Total Bilirubin: 0.5 mg/dL (ref 0.3–1.2)
Total Protein: 7.2 g/dL (ref 6.5–8.1)

## 2022-05-14 LAB — RESP PANEL BY RT-PCR (RSV, FLU A&B, COVID)  RVPGX2
Influenza A by PCR: NEGATIVE
Influenza B by PCR: NEGATIVE
Resp Syncytial Virus by PCR: POSITIVE — AB
SARS Coronavirus 2 by RT PCR: NEGATIVE

## 2022-05-14 NOTE — Progress Notes (Addendum)
CSW spoke with patient's mother Natalie Brown who states she spoke with [name] from [facility] [place] office on 05/10/22. Per [name], the necessary paperwork was submitted to designate Natalie Brown as the payee. Natalie Brown requesting CSW assist with getting visitor restrictions removed so that she can visit the patient on her [birth year] which is [birth year].  CSW sent message to unit AD requesting this matter be looked into.  CSW spoke with Doyne Keel Director to update him on information.  Madilyn Fireman, MSW, LCSW Transitions of Care  Clinical Social Worker II [phone]

## 2022-05-14 NOTE — Progress Notes (Signed)
[facility] PROGRESS NOTE  Natalie Brown (DOB: 06/27/1965) ELF:[id number] PCP: Marliss Coots, NP  Brief Narrative: Natalie Brown is a 57 yo female with a PMH of Bipolar Affective Disorder and Chronic Dementia with Advanced Cerebral Atrophy and Behavioral Disturbances who was admitted on 09/07/21 with acute mania and aggressive behavior.  Psychiatry and Neurology evaluated patient during her stay.  Patient is medically stable for discharge and is awaiting placement at a memory care facility.   Major events: 09/07/2021 admitted to behavioral health.  Treated with Zyprexa Invega and Ativan challenge for catatonia. 12/24/2021 sent to ER from [facility] for evaluation for tachycardia 9/04-9/5, 9/5>9/9, LTM EEG  no seizure. TSH stable CTA chest no PE no pneumonia RPR nonreactive bilateral LE Doppler negative for DVT Echo with normal EF  9/23 febrile secondary to Covid 19 + no pneumonia. 9/25 repeat CT brain unremarkable. 05/14/2022 febrile.  Subjective: Febrile last night but pt has no complaints and no events reported.  Objective: BP (!) 134/110 (BP Location: Right Arm) Comment: Patient was uncooperative  Pulse 78   Temp (!) 101.3 F (38.5 C) (Oral)   Resp 18   Ht 5\' 6"  (1.676 m)   Wt 72 kg   SpO2 90%   BMI 25.62 kg/m   Gen: No distress, sitting up in bed. Nonverbal.  Pulm: Clear, nonlabored on room air  CV: RRR, no MRG, no edema.  GI: Soft, NT, ND, +BS  Neuro: Awake and alert, looking around. Does track but no verbal responses. Did not follow commands but moves all extremities.  Ext: Warm, no deformities Skin: No rashes, lesions or ulcers on visualized skin   Assessment & Plan: Acute metabolic encephalopathy on chronic progressive dementia: AMS has resolved, pt at cognitive baseline. [name] alert and awake but non-communicative. [name] ambulating the halls throughout the day. [name] able to feed herself. - Continue close supervision as she has unintentionally eloped.   Fever:  Unclear etiology at this time. Pt is unable to report symptoms but has no signs of focal infection. Specifically no cough/hypoxia, no changes reported in urination, no recent difference in her abnormal mental status, no nuchal rigidity, no cellulitis on exam at this time.  - Check BMP, CBC. CBC shows no leukocytosis. No tachycardia, hypotension, leg swelling. - Check viral panel. Tested positive under similar circumstances for SARS-CoV-2 on 01/12/22 (4 months ago). Will check this, flu, and RSV as well. - Tylenol prn - CXR and UA ordered, results pending.  - Will observe off antibiotics at this time. If fever is confirmed not to be isolated, may need blood cultures.  Bipolar disorder: Mood is stable. - Continue haldol 1mg  BID - Continue 5mg  q8h prn agitation, has not required this lately. - Continue hydroxyzine 25mg  TID prn anxiety - Can also give ativan 0.5mg  po q6h prn anxiety/agitation.    Hypertension: - BP is stable.   Stage IIIa CKD: Stable.  - Check labs again this AM given her fever.   Vitamin B12 deficiency: Resolved last B12 1195 on 12/11 - Continue oral supp   Sinus tachycardia: Resolved. Previously on propranolol, discontinued due to hypotension/bradycardia   Tobacco use: - Continue nicotine patch   Disposition: Prolonged hospitalization pending placement.  TOC following   COVID infection: Resolved.  Patrecia Pour, MD [facility] [email or web address] 05/14/2022, 12:41 PM

## 2022-05-15 MED ORDER — LACTATED RINGERS IV SOLN: INTRAVENOUS | Status: DC

## 2022-05-15 NOTE — Progress Notes (Signed)
Provider ordered continuous LR for patient. Primary nurse attempted to start IV with sitter's assistance, but patient refused. Provider made aware.

## 2022-05-15 NOTE — Progress Notes (Signed)
[facility] PROGRESS NOTE  Natalie Brown (DOB: [birth year]) IOE:[id number] PCP: Natalie Coots, NP  Brief Narrative: [name] is a 56 yo female with a PMH of Bipolar Affective Disorder and Chronic Dementia with Advanced Cerebral Atrophy and Behavioral Disturbances who was admitted on 09/07/21 with acute mania and aggressive behavior.  Psychiatry and Neurology evaluated patient during her stay.  Patient is medically stable for discharge and is awaiting placement at a memory care facility.   Major events: 09/07/2021 admitted to behavioral health.  Treated with Zyprexa Invega and Ativan challenge for catatonia. 12/24/2021 sent to ER from [facility] for evaluation for tachycardia 9/04-9/5, 9/5>9/9, LTM EEG  no seizure. TSH stable CTA chest no PE no pneumonia RPR nonreactive bilateral LE Doppler negative for DVT Echo with normal EF  9/23 febrile secondary to Covid 19 + no pneumonia. 9/25 repeat CT brain unremarkable. 05/14/2022 febrile. +RSV.  Subjective: Pt not verbal but continues to appear in no distress, remains very poorly redirectable and wanting to get OOB so sitter at bedside. Sitter reports poor po intake and increased cough.   Objective: BP (!) 152/66 (BP Location: Left Arm)   Pulse (!) 111   Temp 99.3 F (37.4 C) (Oral)   Resp 16   Ht 5\' 6"  (1.676 m)   Wt 72 kg   SpO2 96%   BMI 25.62 kg/m   Gen: No distress Pulm: Clear, nonlabored  CV: RRR, no mrg or edema GI: Soft, NT, ND, +BS  Neuro: Alert and disoriented, moving all extremities. No new focal deficits. Ext: Warm, no deformities Skin: No rashes, lesions or ulcers on visualized skin   Assessment & Plan: Acute metabolic encephalopathy on chronic progressive dementia: AMS has resolved, pt at cognitive baseline. [name] alert and awake but non-communicative. [name] ambulating the halls throughout the day. [name] able to feed herself. - Continue close supervision as she has unintentionally eloped.   RSV: Developed fever  without leukocytosis, +RSV on viral panel PCR on 1/23.  - Having cough and tachycardia, will start IVF.  - Tylenol prn  Bipolar disorder: Mood is stable. - Continue haldol 1mg  BID - Continue 5mg  q8h prn agitation, has not required this lately. - Continue hydroxyzine 25mg  TID prn anxiety - Can also give ativan 0.5mg  po q6h prn anxiety/agitation.    Hypertension: - BP is stable.   Stage IIIa CKD: Stable.  - Check labs again this AM given her fever.   Vitamin B12 deficiency: Resolved last B12 1195 on 12/11 - Continue oral supp   Sinus tachycardia: Resolved. Previously on propranolol, discontinued due to hypotension/bradycardia   Tobacco use: - Continue nicotine patch   Disposition: Prolonged hospitalization pending placement.  TOC following   COVID infection: Resolved.  Patrecia Pour, MD [facility] [email or web address] 05/15/2022, 8:20 AM

## 2022-05-16 NOTE — Progress Notes (Signed)
[facility] PROGRESS NOTE  Natalie Brown (DOB: 03/14/1966) [id number] PCP: Natalie Coots, NP  Brief Narrative: 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  Psychiatry and Neurology evaluated patient during her stay.  Patient is medically stable for discharge and is awaiting placement at a memory care facility.   Major events: 09/07/2021 admitted to behavioral health.  Treated with Zyprexa Invega and Ativan challenge for catatonia. 12/24/2021 sent to ER from [facility] for evaluation for tachycardia 9/04-9/5, 9/5>9/9, LTM EEG  no seizure. TSH stable CTA chest no PE no pneumonia RPR nonreactive bilateral LE Doppler negative for DVT Echo with normal EF  9/23 febrile secondary to Covid 19 + no pneumonia. 9/25 repeat CT brain unremarkable. 05/14/2022 febrile. +RSV.  Subjective: Pt unchanged, not verbal, doesn't seem to be redirectable at all. Other than an ongoing cough, nothing noted by RN or sitter overnight. Wished her a happy birthday this morning.  Objective: BP 106/74 (BP Location: Right Arm)   Pulse (!) 110   Temp 97.8 F (36.6 C)   Resp 17   Ht 5\' 6"  (1.676 m)   Wt 72 kg   SpO2 99%   BMI 25.62 kg/m   No distress Regular tachycardia in 100's, no JVD, edema, MRG Clear, nonlabored +BS, soft, NT, ND  Assessment & Plan: Acute metabolic encephalopathy on chronic progressive dementia: AMS has resolved, pt at cognitive baseline. [name] alert and awake but non-communicative. [name] ambulating the halls throughout the day. [name] able to feed herself. - Continue close supervision as she has unintentionally eloped.   RSV: Developed fever without leukocytosis, +RSV on viral panel PCR on 1/23.  - Having cough and tachycardia, attempted to start IVF to make up for insensible losses but patient's mental status  precludes safe placement and maintenance at this time.BP stable. Risk of sedation outweighs potential benefit of IVF in pt tolerating po at this time. RN and sitter aware of need to push po hydration as much as possible. - Tylenol prn  Bipolar disorder: Mood is stable. - Continue haldol 1mg  BID - Continue 5mg  q8h prn agitation, has not required this lately. - Continue hydroxyzine 25mg  TID prn anxiety - Can also give ativan 0.5mg  po q6h prn anxiety/agitation.    Hypertension: - BP is stable.   Stage IIIa CKD: Stable.  - Check labs periodically.   Vitamin B12 deficiency: Resolved last B12 1195 on 12/11 - Continue oral supp   Sinus tachycardia: Resolved. Previously on propranolol, discontinued due to hypotension/bradycardia   Tobacco use: - Continue nicotine patch   Disposition: Prolonged hospitalization pending placement.  TOC following   COVID infection: Resolved.  Patrecia Pour, MD [facility] [email or web address] 05/16/2022, 9:22 AM

## 2022-05-17 LAB — URINALYSIS, ROUTINE W REFLEX MICROSCOPIC
Bacteria, UA: NONE SEEN
Bilirubin Urine: NEGATIVE
Glucose, UA: NEGATIVE mg/dL
Hgb urine dipstick: NEGATIVE
Ketones, ur: NEGATIVE mg/dL
Leukocytes,Ua: NEGATIVE
Nitrite: NEGATIVE
Protein, ur: NEGATIVE mg/dL
Specific Gravity, Urine: 1.019 (ref 1.005–1.030)
pH: 5 (ref 5.0–8.0)

## 2022-05-17 NOTE — Progress Notes (Signed)
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  Psychiatry and Neurology evaluated patient during her stay.  Patient is medically stable for discharge and is awaiting placement at a memory care facility.   Major events: 09/07/2021 admitted to behavioral health.  Treated with Zyprexa Invega and Ativan challenge for catatonia. 12/24/2021 sent to ER from [facility] for evaluation for tachycardia 9/04-9/5, 9/5>9/9, LTM EEG  no seizure. TSH stable CTA chest no PE no pneumonia RPR nonreactive bilateral LE Doppler negative for DVT Echo with normal EF  9/23 febrile secondary to Covid 19 + no pneumonia. 9/25 repeat CT brain unremarkable. 05/14/2022 febrile. Noted to be +RSV.  Subjective: Pt unchanged, not verbal, doesn't seem to be in any acute distress, ROS limited due to mental status.  Poor p.o. intake this morning likely in the setting of acute infection.  Objective: BP 124/85 (BP Location: Left Arm)   Pulse 93   Temp 98 F (36.7 C)   Resp 17   Ht 5\' 6"  (1.676 m)   Wt 72 kg   SpO2 100%   BMI 25.62 kg/m   No distress Regular tachycardia in 100's, no JVD, edema, MRG Clear, nonlabored +BS, soft, NT, ND  Assessment & Plan:  Acute metabolic encephalopathy on chronic progressive dementia:  - AMS has resolved, pt at cognitive baseline. [name] alert and awake but non-communicative. [name] ambulating the halls throughout the day. [name] able to feed herself. - Continue close supervision as she has unintentionally eloped.   RSV:  - Developed fever without leukocytosis, +RSV on viral panel PCR on 1/23.  - Increase PO intake as tolerated; hold IV fluids for now unless PO intake does not improve. - Tylenol  prn  Bipolar disorder:  - Mood is stable. - Continue haldol 1mg  BID - Continue 5mg  q8h prn agitation, has not required this lately. - Continue hydroxyzine 25mg  TID prn anxiety - PRN ativan 0.5mg  po q6h for anxiety/agitation.    Hypertension: - BP is stable off all medications   Stage IIIa CKD: Stable.  - Check labs periodically/as indicated   Vitamin B12 deficiency: Resolved last B12 1195 on 12/11 - Continue oral supp   History of sinus tachycardia:  - Resolved. Previously on propranolol, discontinued due to hypotension/bradycardia   Tobacco use: - Continue nicotine patch   Disposition: Prolonged hospitalization pending placement.  TOC following. Patient remains medically stable for discharge   COVID infection: Resolved.  Little Ishikawa, DO [facility] [email or web address] 05/17/2022, 6:45 AM

## 2022-05-18 NOTE — Progress Notes (Signed)
[facility] PROGRESS NOTE  Natalie Brown (DOB: 1965-07-03) [id number] PCP: Marliss Coots, NP  Brief Narrative: 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  Psychiatry and Neurology evaluated patient during her stay.  Patient is medically stable for discharge and is awaiting placement at a memory care facility.   Major events: 09/07/2021 admitted to behavioral health.  Treated with Zyprexa Invega and Ativan challenge for catatonia. 12/24/2021 sent to ER from [facility] for evaluation for tachycardia 9/04-9/5, 9/5>9/9, LTM EEG  no seizure. TSH stable CTA chest no PE no pneumonia RPR nonreactive bilateral LE Doppler negative for DVT Echo with normal EF  9/23 febrile secondary to Covid 19 + no pneumonia. 9/25 repeat CT brain unremarkable. 05/14/2022 febrile. Noted to be +RSV.  Subjective: Pt unchanged, not verbal, doesn't seem to be in any acute distress, ROS limited due to mental status.  Poor p.o. intake this morning likely in the setting of acute infection.  Objective: BP 100/63 (BP Location: Right Arm)   Pulse 64   Temp (!) 97.4 F (36.3 C) (Oral)   Resp 17   Ht 5\' 6"  (1.676 m)   Wt 72 kg   SpO2 100%   BMI 25.62 kg/m   No distress Regular tachycardia in 100's, no JVD, edema, MRG Clear, nonlabored +BS, soft, NT, ND  Assessment & Plan:  Acute metabolic encephalopathy on chronic progressive dementia:  - AMS has resolved, pt at cognitive baseline. [name] alert and awake but non-communicative. [name] ambulating the halls throughout the day. [name] able to feed herself. - Continue close supervision as she has unintentionally eloped.   RSV:  - Developed fever without leukocytosis, +RSV on viral panel PCR on 1/23.  - Increase PO intake as tolerated; hold IV fluids for now unless PO intake does not  improve. - Tylenol prn  Bipolar disorder:  - Mood is stable. - Continue haldol 1mg  BID - Continue 5mg  q8h prn agitation, has not required this lately. - Continue hydroxyzine 25mg  TID prn anxiety - PRN ativan 0.5mg  po q6h for anxiety/agitation.    Hypertension: - BP is stable off all medications   Stage IIIa CKD: Stable.  - Check labs periodically/as indicated   Vitamin B12 deficiency: Resolved last B12 1195 on 12/11 - Continue oral supp   History of sinus tachycardia:  - Resolved. Previously on propranolol, discontinued due to hypotension/bradycardia   Tobacco use: - Continue nicotine patch   Disposition: Prolonged hospitalization pending placement.  TOC following. Patient remains medically stable for discharge   COVID infection: Resolved.  Little Ishikawa, DO [facility] [email or web address] 05/18/2022, 7:29 AM

## 2022-05-18 NOTE — Progress Notes (Signed)
Mobility Specialist Progress Note:   05/18/22 1406  Mobility  Activity Dangled on edge of bed  Assistive Device None  Activity Response Tolerated poorly  Mobility Referral Yes  $Mobility charge 1 Mobility   Pt received sleeping in bed and difficult to wake. Upon sitting EOB, pt laid back down and went back to sleep. Pt returned to supine with all needs met and sitter in room.   Andrey Campanile Mobility Specialist Please contact via SecureChat or  Rehab office at [phone]

## 2022-05-19 NOTE — Progress Notes (Signed)
Patient does not have a 1:1 sitter for the shift of 05/19/22 7PM-7AM. Babs Bertin DO has been notified of same. Pt has an active order for a 1:1 sitter.

## 2022-05-19 NOTE — Progress Notes (Signed)
[facility] PROGRESS NOTE  Natalie Brown (DOB: November 20, 1965) [id number] PCP: Marliss Coots, NP  Brief Narrative: Natalie Brown is a 57 yo female with a PMH of Bipolar Affective Disorder and Chronic Dementia with Advanced Cerebral Atrophy and Behavioral Disturbances who was admitted on 09/07/21 with acute mania and aggressive behavior.  Psychiatry and Neurology evaluated patient during her stay.  Patient is medically stable for discharge and is awaiting placement at a memory care facility.   Major events: 09/07/2021 admitted to behavioral health.  Treated with Zyprexa Invega and Ativan challenge for catatonia. 12/24/2021 sent to ER from [facility] for evaluation for tachycardia 9/04-9/5, 9/5>9/9, LTM EEG  no seizure. TSH stable CTA chest no PE no pneumonia RPR nonreactive bilateral LE Doppler negative for DVT Echo with normal EF  9/23 febrile secondary to Covid 19 + no pneumonia. 9/25 repeat CT brain unremarkable. 05/14/2022 Febrile. Noted to be +RSV.  Subjective: Pt unchanged, not verbal, doesn't seem to be in any acute distress, ROS limited due to mental status.  Poor p.o. intake this morning likely in the setting of acute infection.  Objective: BP 119/72 (BP Location: Left Arm)   Pulse 92   Temp 98.2 F (36.8 C) (Oral)   Resp 18   Ht 5\' 6"  (1.676 m)   Wt 72 kg   SpO2 100%   BMI 25.62 kg/m   No distress Regular tachycardia in 100's, no JVD, edema, MRG Clear, nonlabored +BS, soft, NT, ND  Assessment & Plan:  Acute metabolic encephalopathy on chronic progressive dementia:  - AMS has resolved, pt at cognitive baseline. [name] alert and awake but non-communicative. [name] ambulating the halls throughout the day. [name] able to feed herself. - Continue close supervision as she has unintentionally eloped.   RSV:  - Developed fever without leukocytosis, +RSV on viral panel PCR on 1/23 - quarantine through 1/28 (5 days total) - Increase PO intake as tolerated; hold IV fluids for  now unless PO intake does not improve. - Tylenol prn  Bipolar disorder:  - Mood is stable. - Continue haldol 1mg  BID - Continue 5mg  q8h prn agitation, has not required this lately. - Continue hydroxyzine 25mg  TID prn anxiety - PRN ativan 0.5mg  po q6h for anxiety/agitation.    Hypertension: - BP is stable off all medications   Stage IIIa CKD: Stable.  - Check labs periodically/as indicated   Vitamin B12 deficiency: Resolved last B12 1195 on 12/11 - Continue oral supp   History of sinus tachycardia:  - Resolved. Previously on propranolol, discontinued due to hypotension/bradycardia   Tobacco use: - Continue nicotine patch   Disposition: Prolonged hospitalization pending placement.  TOC following. Patient remains medically stable for discharge   COVID infection: Resolved.  Natalie Ishikawa, DO [facility] [email or web address] 05/19/2022, 7:17 AM

## 2022-05-19 NOTE — Progress Notes (Signed)
Mobility Specialist Progress Note:   05/19/22 1557  Mobility  Activity Dangled on edge of bed  Assistive Device None  Activity Response Tolerated fair  Mobility Referral Yes  $Mobility charge 1 Mobility   Pt received in bed and asleep but easily woken. Pt was fatigued throughout session and required max encouragement to sit EOB. Upon sitting EOB, pt grabbed blankets and laid back down. Pt returned to supine with all needs met, call bell in reach, and bed alarm on.   Andrey Campanile Mobility Specialist Please contact via SecureChat or  Rehab office at [phone]

## 2022-05-20 NOTE — Progress Notes (Signed)
Mobility Specialist Progress Note:   05/20/22 1100  Mobility  Activity Ambulated independently in hallway  Level of Assistance Independent  Assistive Device None  Distance Ambulated (ft) 100 ft  Activity Response Tolerated well  Mobility Referral Yes  $Mobility charge 1 Mobility   Pt received ambulating in room and eager. Asymptomatic throughout. Ambulated to [place] to grab soda and returned back to room with sitter.   Andrey Campanile Mobility Specialist Please contact via SecureChat or  Rehab office at [phone]

## 2022-05-20 NOTE — Progress Notes (Signed)
Mobility Specialist Progress Note:   05/20/22 1433  Mobility  Activity Ambulated independently in hallway  Level of Assistance Independent  Assistive Device None  Distance Ambulated (ft) 500 ft  Activity Response Tolerated well  Mobility Referral Yes  $Mobility charge 1 Mobility   Pt received ambulating outside of room with sitter and agreeable to longer session. Asymptomatic throughout. Pt returned to bed with all needs met, call bell in reach, and sitter in room.   Andrey Campanile Mobility Specialist Please contact via SecureChat or  Rehab office at [phone]

## 2022-05-20 NOTE — Progress Notes (Signed)
[facility] night cross cover note:  I was notified by RN that this patient is confused, repeatedly attempting to get out of bed and ambulate into/down the hallway, with these behaviors refractory to attempts at verbal redirection.  In the setting of associated interference with ongoing medical treatment posing potential harm to themself, I have placed orders for posey belt, soft bilateral wrist restraints.    Babs Bertin, DO Hospitalist

## 2022-05-20 NOTE — Progress Notes (Signed)
[facility] PROGRESS NOTE  Natalie Brown (DOB: 1965-10-30) [id number] PCP: Marliss Coots, NP  Brief Narrative: Natalie Brown is a 57 yo female with a PMH of Bipolar Affective Disorder and Chronic Dementia with Advanced Cerebral Atrophy and Behavioral Disturbances who was admitted on 09/07/21 with acute mania and aggressive behavior.  Psychiatry and Neurology evaluated patient during her stay.  Patient is medically stable for discharge and is awaiting placement at a memory care facility.   Major events: 09/07/2021 admitted to behavioral health.  Treated with Zyprexa Invega and Ativan challenge for catatonia. 12/24/2021 sent to ER from [facility] for evaluation for tachycardia 9/04-9/5, 9/5>9/9, LTM EEG  no seizure. TSH stable CTA chest no PE no pneumonia RPR nonreactive bilateral LE Doppler negative for DVT Echo with normal EF  9/23 febrile secondary to Covid 19 + no pneumonia. 9/25 repeat CT brain unremarkable. 05/14/2022 Febrile. Noted to be +RSV.  Subjective: Pt unchanged, not verbal, doesn't seem to be in any acute distress, ROS limited due to mental status.  Poor p.o. intake this morning likely in the setting of acute infection.  Objective: BP 108/75 (BP Location: Right Arm)   Pulse 71   Temp 98 F (36.7 C) (Axillary)   Resp 16   Ht 5\' 6"  (1.676 m)   Wt 72 kg   SpO2 99%   BMI 25.62 kg/m   No distress Regular tachycardia in 100's, no JVD, edema, MRG Clear, nonlabored +BS, soft, NT, ND  Assessment & Plan:  Acute metabolic encephalopathy on chronic progressive dementia:  - AMS has resolved, pt at cognitive baseline. [name] alert and awake but non-communicative. [name] ambulating the halls throughout the day. [name] able to feed herself. - Continue close supervision as she has unintentionally eloped.   RSV:  - Developed fever without leukocytosis, +RSV on viral panel PCR on 1/23 - quarantine through 1/28 (5 days total) - Increase PO intake as tolerated; hold IV fluids for  now unless PO intake does not improve. - Tylenol prn  Bipolar disorder:  - Mood is stable. - Continue haldol 1mg  BID - Continue 5mg  q8h prn agitation, has not required this lately. - Continue hydroxyzine 25mg  TID prn anxiety - PRN ativan 0.5mg  po q6h for anxiety/agitation.    Hypertension: - BP is stable off all medications   Stage IIIa CKD: Stable.  - Check labs periodically/as indicated   Vitamin B12 deficiency: Resolved last B12 1195 on 12/11 - Continue oral supp   History of sinus tachycardia:  - Resolved. Previously on propranolol, discontinued due to hypotension/bradycardia   Tobacco use: - Continue nicotine patch   Disposition: Prolonged hospitalization pending placement.  TOC following. Patient remains medically stable for discharge   COVID infection: Resolved.  Natalie Ishikawa, DO [facility] [email or web address] 05/20/2022, 7:24 AM

## 2022-05-20 NOTE — Plan of Care (Signed)

## 2022-05-21 NOTE — Plan of Care (Signed)

## 2022-05-21 NOTE — Progress Notes (Signed)
[facility] PROGRESS NOTE  Natalie Brown (DOB: [birth year]) [id number] PCP: Natalie Coots, NP  Brief Narrative: [name] is a 57 yo female with a PMH of Bipolar Affective Disorder and Chronic Dementia with Advanced Cerebral Atrophy and Behavioral Disturbances who was admitted on 09/07/21 with acute mania and aggressive behavior.  Psychiatry and Neurology evaluated patient during her stay.  Patient is medically stable for discharge and is awaiting placement at a memory care facility.   Major events: 09/07/2021 admitted to behavioral health.  Treated with Zyprexa Invega and Ativan challenge for catatonia. 12/24/2021 sent to ER from [facility] for evaluation for tachycardia 9/04-9/5, 9/5>9/9, LTM EEG  no seizure. TSH stable CTA chest no PE no pneumonia RPR nonreactive bilateral LE Doppler negative for DVT Echo with normal EF  9/23 febrile secondary to Covid 19 + no pneumonia. 9/25 repeat CT brain unremarkable. 05/14/2022 Febrile. Noted to be +RSV.  Subjective: Attempting to get out of bed overnight -posey belt as no sitters were available. Patient unchanged, not verbal, doesn't seem to be in any acute distress, ROS limited due to mental status.   Objective: BP 103/67 (BP Location: Right Arm)   Pulse (!) 108   Temp 99 F (37.2 C)   Resp 18   Ht 5\' 6"  (1.676 m)   Wt 72 kg   SpO2 100%   BMI 25.62 kg/m   No distress Regular tachycardia in 100's, no JVD, edema, MRG Clear, nonlabored +BS, soft, NT, ND  Assessment & Plan:  Acute metabolic encephalopathy on chronic progressive dementia, resolved:  - At cognitive baseline. [name] alert and awake but non-communicative. She ambulated freely during the day. [name] able to feed herself. - Continue close supervision as she has unintentionally eloped.   RSV:  - Developed fever without leukocytosis, +RSV on viral panel PCR on 1/23 - no longer requires precautions - Increase PO intake as tolerated; hold IV fluids for now unless PO intake  does not improve. - Tylenol prn  Bipolar disorder:  - Mood is stable. - Continue haldol 1mg  BID - Continue 5mg  q8h prn agitation, has not required this lately. - Continue hydroxyzine 25mg  TID prn anxiety - PRN ativan 0.5mg  po q6h for anxiety/agitation.    Hypertension: - BP is stable off all medications   Stage IIIa CKD: Stable.  - Check labs periodically/as indicated   Vitamin B12 deficiency: Resolved last B12 1195 on 12/11 - Continue oral supp   History of sinus tachycardia:  - Resolved. Previously on propranolol, discontinued due to hypotension/bradycardia   Tobacco use: - Continue nicotine patch   Disposition: Prolonged hospitalization pending placement.  TOC following. Patient remains medically stable for discharge   COVID infection: Resolved.  Little Ishikawa, DO [facility] [email or web address] 05/21/2022, 7:15 AM

## 2022-05-21 NOTE — Progress Notes (Signed)
CSW spoke with patient's mother Gregary Signs who states she spoke with Mervin Hack at [facility] who states the patient's application has been approved and that she will receive $914 monthly. Gregary Signs states she understands that the SSI income will be forfeited to the facility minus the allotted allowance.   CSW will contact facilities to determine if any bed offers can be obtained.  Madilyn Fireman, MSW, LCSW Transitions of Care  Clinical Social Worker II [phone]

## 2022-05-22 NOTE — Progress Notes (Signed)
Mobility Specialist Progress Note:   05/22/22 1005  Mobility  Activity Dangled on edge of bed  Level of Assistance Independent  Assistive Device None  Activity Response Tolerated well  Mobility Referral Yes  $Mobility charge 1 Mobility   Pt received in bed and agreeable. Asymptomatic throughout. Upon sitting EOB, pt grabbed blankets and laid back down. Pt returned to supine with all needs met and sitter in room.   Andrey Campanile Mobility Specialist Please contact via SecureChat or  Rehab office at [phone]

## 2022-05-22 NOTE — Plan of Care (Signed)

## 2022-05-22 NOTE — Progress Notes (Addendum)
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  Psychiatry and Neurology evaluated patient during her stay.  Patient is medically stable for discharge and is awaiting placement at a memory care facility.   Major events: 09/07/2021 admitted to behavioral health.  Treated with Zyprexa Invega and Ativan challenge for catatonia. 12/24/2021 sent to ER from [facility] for evaluation for tachycardia 9/04-9/5, 9/5>9/9, LTM EEG  no seizure. TSH stable CTA chest no PE no pneumonia RPR nonreactive bilateral LE Doppler negative for DVT Echo with normal EF  9/23 febrile secondary to Covid 19 + no pneumonia. 9/25 repeat CT brain unremarkable. 05/14/2022 Febrile. Noted to be +RSV.    Assessment & Plan:  Principal Problem:   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) Active Problems:   Delirium due to multiple etiologies, acute, hyperactive   Bipolar affective disorder, current episode manic with psychotic symptoms (HCC)   Sinus tachycardia   Tobacco abuse   Altered mental state   B12 deficiency   COVID-19 virus infection   Drooling   Essential hypertension  Acute metabolic encephalopathy on the background of chronic progressive dementia: Currently mental status at baseline.  Patient is alert, awake but does not communicate.  Ambulates freely during the day.  Able to feed by herself.  Continue close supervision she has unintentionally eloped.   Recent RSV: Noted to be RSV positive on 1/23.  She was febrile at the time.  Currently asymptomatic  Bipolar disorder: Currently mood is stable.  Continue Haldol, zyprexa ,hydroxyzine and Ativan as needed  Hypertension: Currently off medications.  Currently BP  stable.  CKD stage IIIb: Currently kidney function is stable.  Monitor intermittently  Vitamin B12 deficiency: Continue oral supplementation  Sinus tachycardia: Currently stable.  Propranolol discontinued due to hypotension/bradycardia.  Monitor  Disposition/debility/deconditioning: Medically stable for discharge.  Pending placement.  TOC following.         DVT prophylaxis:enoxaparin (LOVENOX) injection 40 mg Start: 12/26/21 1200     Code Status: Full Code  Family Communication: None at bedside  Patient status:Inpatient  Patient is from : Outside behavioral Hospital  Anticipated discharge to: Long-term memory care  Estimated DC date: Not sure   Consultants: None  Procedures: None  Antimicrobials:  Anti-infectives (From admission, onward)    None       Subjective:  Patient seen and examined at bedside today.  Very comfortable, hemodynamically stable, lying in bed.  Looking on the roof.  During my presence in the room, she did not look at me, her eyes were staring up.  Awake but not alert.  Not in any distress  Objective: Vitals:   05/20/22 2008 05/21/22 0716 05/21/22 2014 05/22/22 0423  BP: 103/67 118/74 135/81 128/82  Pulse: (!) 108 95 61 99  Resp: 18 18 16 16   Temp: 99 F (37.2 C) 98 F (36.7 C) 98.6 F (37 C) 97.6 F (36.4 C)  TempSrc:  Oral Oral Oral  SpO2: 100% 96% 92% 98%  Weight:      Height:       No intake or output data in the 24 hours ending 05/22/22 1045 Filed Weights   02/03/22 2025  Weight: 72 kg    Examination:  General exam: Overall comfortable,  not in distress HEENT: PERRL Respiratory system:  no wheezes or crackles  Cardiovascular system: S1 & S2 heard, RRR.  Gastrointestinal system: Abdomen is nondistended, soft and nontender. Central nervous system: awake but not alert or oriented Extremities: No edema, no clubbing ,no cyanosis Skin: No rashes, no ulcers,no icterus     Data Reviewed: I have personally reviewed  following labs and imaging studies  CBC: No results for input(s): "WBC", "NEUTROABS", "HGB", "HCT", "MCV", "PLT" in the last 168 hours. Basic Metabolic Panel: No results for input(s): "NA", "K", "CL", "CO2", "GLUCOSE", "BUN", "CREATININE", "CALCIUM", "MG", "PHOS" in the last 168 hours.   Recent Results (from the past 240 hour(s))  Respiratory (~20 pathogens) panel by PCR     Status: Abnormal   Collection Time: 05/14/22 12:48 PM   Specimen: Nasopharyngeal Swab; Respiratory  Result Value Ref Range Status   Adenovirus NOT DETECTED NOT DETECTED Final   Coronavirus 229E NOT DETECTED NOT DETECTED Final    Comment: (NOTE) The Coronavirus on the Respiratory Panel, DOES NOT test for the novel  Coronavirus (2019 nCoV)    Coronavirus HKU1 NOT DETECTED NOT DETECTED Final   Coronavirus NL63 NOT DETECTED NOT DETECTED Final   Coronavirus OC43 NOT DETECTED NOT DETECTED Final   Metapneumovirus NOT DETECTED NOT DETECTED Final   Rhinovirus / Enterovirus NOT DETECTED NOT DETECTED Final   Influenza A NOT DETECTED NOT DETECTED Final   Influenza B NOT DETECTED NOT DETECTED Final   Parainfluenza Virus 1 NOT DETECTED NOT DETECTED Final   Parainfluenza Virus 2 NOT DETECTED NOT DETECTED Final   Parainfluenza Virus 3 NOT DETECTED NOT DETECTED Final   Parainfluenza Virus 4 NOT DETECTED NOT DETECTED Final   Respiratory Syncytial Virus DETECTED (A) NOT DETECTED Final   Bordetella pertussis NOT DETECTED NOT DETECTED Final   Bordetella Parapertussis NOT DETECTED NOT DETECTED Final   Chlamydophila pneumoniae NOT DETECTED NOT DETECTED Final   Mycoplasma pneumoniae NOT DETECTED NOT DETECTED Final    Comment: Performed at [facility], [address], [place], [place] [address]  Resp panel by RT-PCR (RSV, Flu A&B, Covid) Anterior Nasal Swab     Status: Abnormal   Collection Time: 05/14/22 12:48 PM   Specimen: Anterior Nasal Swab  Result Value Ref Range Status   SARS Coronavirus 2 by RT PCR NEGATIVE NEGATIVE  Final    Comment: (NOTE) SARS-CoV-2 target nucleic acids are NOT DETECTED.  The SARS-CoV-2 RNA is generally detectable in upper respiratory specimens during the acute phase of infection. The lowest concentration of SARS-CoV-2 viral copies this assay can detect is 138 copies/mL. A negative result does not preclude SARS-Cov-2 infection and should not be used as the sole basis for treatment or other patient management decisions. A negative result may occur with  improper specimen collection/handling, submission of specimen other than nasopharyngeal swab, presence of viral mutation(s) within the areas targeted by this assay, and inadequate number of viral copies(<138 copies/mL). A negative result must be combined with clinical observations, patient history, and epidemiological information. The expected result is Negative.  Fact Sheet for Patients:  EntrepreneurPulse.com.au  Fact Sheet for Healthcare Providers:  IncredibleEmployment.be  This test is no t yet approved or cleared by the [place] FDA and  has been authorized for detection and/or diagnosis of SARS-CoV-2 by FDA under an Emergency Use Authorization (EUA). This EUA will remain  in effect (meaning this test can be used) for the duration of the COVID-19 declaration under Section 564(b)(1) of the Act, 21 U.S.C.section 360bbb-3(b)(1),  unless the authorization is terminated  or revoked sooner.       Influenza A by PCR NEGATIVE NEGATIVE Final   Influenza B by PCR NEGATIVE NEGATIVE Final    Comment: (NOTE) The Xpert Xpress SARS-CoV-2/FLU/RSV plus assay is intended as an aid in the diagnosis of influenza from Nasopharyngeal swab specimens and should not be used as a sole basis for treatment. Nasal washings and aspirates are unacceptable for Xpert Xpress SARS-CoV-2/FLU/RSV testing.  Fact Sheet for Patients: EntrepreneurPulse.com.au  Fact Sheet for Healthcare  Providers: IncredibleEmployment.be  This test is not yet approved or cleared by the [place] FDA and has been authorized for detection and/or diagnosis of SARS-CoV-2 by FDA under an Emergency Use Authorization (EUA). This EUA will remain in effect (meaning this test can be used) for the duration of the COVID-19 declaration under Section 564(b)(1) of the Act, 21 U.S.C. section 360bbb-3(b)(1), unless the authorization is terminated or revoked.     Resp Syncytial Virus by PCR POSITIVE (A) NEGATIVE Final    Comment: (NOTE) Fact Sheet for Patients: EntrepreneurPulse.com.au  Fact Sheet for Healthcare Providers: IncredibleEmployment.be  This test is not yet approved or cleared by the [place] FDA and has been authorized for detection and/or diagnosis of SARS-CoV-2 by FDA under an Emergency Use Authorization (EUA). This EUA will remain in effect (meaning this test can be used) for the duration of the COVID-19 declaration under Section 564(b)(1) of the Act, 21 U.S.C. section 360bbb-3(b)(1), unless the authorization is terminated or revoked.  Performed at [facility], [place] [address], [place], [place] [address]      Radiology Studies: No results found.  Scheduled Meds:  (feeding supplement) PROSource Plus  30 mL Oral BID BM   vitamin B-12  500 mcg Oral Daily   enoxaparin (LOVENOX) injection  40 mg Subcutaneous Q24H   feeding supplement (KATE FARMS STANDARD 1.4)  325 mL Oral BID BM   haloperidol  1 mg Oral BID   hydrocerin   Topical BID   Continuous Infusions:   LOS: 147 days   Shelly Coss, MD [facility] P1/31/2024, 10:45 AM

## 2022-05-23 NOTE — Progress Notes (Signed)
PROGRESS NOTE  Natalie Brown  EZM:[id number] DOB: 03/08/1966 DOA: 12/24/2021 PCP: Natalie Coots, NP   Brief Narrative: Patient  is a 57 yo female with a PMH of Bipolar Affective Disorder and Chronic Dementia with Advanced Cerebral Atrophy and Behavioral Disturbances who was admitted on 09/07/21 with acute mania and aggressive behavior.  Psychiatry and Neurology evaluated patient during her stay.  Patient is medically stable for discharge and is awaiting placement at a memory care facility.   Major events: 09/07/2021 admitted to behavioral health.  Treated with Zyprexa Invega and Ativan challenge for catatonia. 12/24/2021 sent to ER from [facility] for evaluation for tachycardia 9/04-9/5, 9/5>9/9, LTM EEG  no seizure. TSH stable CTA chest no PE no pneumonia RPR nonreactive bilateral LE Doppler negative for DVT Echo with normal EF  9/23 febrile secondary to Covid 19 + no pneumonia. 9/25 repeat CT brain unremarkable. 05/14/2022 Febrile. Noted to be +RSV.    Assessment & Plan:  Principal Problem:   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) Active Problems:   Delirium due to multiple etiologies, acute, hyperactive   Bipolar affective disorder, current episode manic with psychotic symptoms (HCC)   Sinus tachycardia   Tobacco abuse   Altered mental state   B12 deficiency   COVID-19 virus infection   Drooling   Essential hypertension  Acute metabolic encephalopathy on the background of chronic progressive dementia: Currently mental status at baseline.  Patient is alert, awake but does not communicate.  Ambulates freely during the day.  Able to feed by herself.  Continue close supervision as she has unintentionally eloped.   Recent RSV: Noted to be RSV positive on 1/23.  She was febrile at the time.  Currently asymptomatic  Bipolar disorder: Currently mood looks stable.  Continue Haldol, zyprexa ,hydroxyzine and Ativan as needed.Doesnot communicate  Hypertension: Currently off  medications.  Currently BP stable.  CKD stage IIIb: Currently kidney function is stable.  Monitor intermittently  Vitamin B12 deficiency: Continue oral supplementation  Sinus tachycardia: Currently stable.  Propranolol discontinued due to hypotension/bradycardia.  Monitor  Disposition/debility/deconditioning: Medically stable for discharge.  Pending placement.  TOC following.         DVT prophylaxis:enoxaparin (LOVENOX) injection 40 mg Start: 12/26/21 1200     Code Status: Full Code  Family Communication: None at bedside  Patient status:Inpatient  Patient is from : Outside behavioral Hospital  Anticipated discharge to: Long-term memory care  Estimated DC date: Not sure   Consultants: None  Procedures: None  Antimicrobials:  Anti-infectives (From admission, onward)    None       Subjective:  Patient seen and examined the bedside today.  Same like yesterday, lying in bed.  Awake but not alert or oriented.  Does not answer to any questions.  Staring blankly.  Not in any distress  Objective: Vitals:   05/21/22 0716 05/21/22 2014 05/22/22 0423 05/23/22 0513  BP: 118/74 135/81 128/82 (!) 113/95  Pulse: 95 61 99 (!) 106  Resp: 18 16 16 17   Temp: 98 F (36.7 C) 98.6 F (37 C) 97.6 F (36.4 C) 98 F (36.7 C)  TempSrc: Oral Oral Oral   SpO2: 96% 92% 98%   Weight:      Height:       No intake or output data in the 24 hours ending 05/23/22 1027 Filed Weights   02/03/22 2025  Weight: 72 kg    Examination:  General exam: Overall comfortable, not in distress, lying in bed HEENT: PERRL Respiratory  system:  no wheezes or crackles  Cardiovascular system: S1 & S2 heard, RRR.  Gastrointestinal system: Abdomen is nondistended, soft and nontender. Central nervous system: Awake but not alert or oriented Extremities: No edema, no clubbing ,no cyanosis Skin: No rashes, no ulcers,no icterus       Data Reviewed: I have personally reviewed following labs and  imaging studies  CBC: No results for input(s): "WBC", "NEUTROABS", "HGB", "HCT", "MCV", "PLT" in the last 168 hours. Basic Metabolic Panel: No results for input(s): "NA", "K", "CL", "CO2", "GLUCOSE", "BUN", "CREATININE", "CALCIUM", "MG", "PHOS" in the last 168 hours.   Recent Results (from the past 240 hour(s))  Respiratory (~20 pathogens) panel by PCR     Status: Abnormal   Collection Time: 05/14/22 12:48 PM   Specimen: Nasopharyngeal Swab; Respiratory  Result Value Ref Range Status   Adenovirus NOT DETECTED NOT DETECTED Final   Coronavirus 229E NOT DETECTED NOT DETECTED Final    Comment: (NOTE) The Coronavirus on the Respiratory Panel, DOES NOT test for the novel  Coronavirus (2019 nCoV)    Coronavirus HKU1 NOT DETECTED NOT DETECTED Final   Coronavirus NL63 NOT DETECTED NOT DETECTED Final   Coronavirus OC43 NOT DETECTED NOT DETECTED Final   Metapneumovirus NOT DETECTED NOT DETECTED Final   Rhinovirus / Enterovirus NOT DETECTED NOT DETECTED Final   Influenza A NOT DETECTED NOT DETECTED Final   Influenza B NOT DETECTED NOT DETECTED Final   Parainfluenza Virus 1 NOT DETECTED NOT DETECTED Final   Parainfluenza Virus 2 NOT DETECTED NOT DETECTED Final   Parainfluenza Virus 3 NOT DETECTED NOT DETECTED Final   Parainfluenza Virus 4 NOT DETECTED NOT DETECTED Final   Respiratory Syncytial Virus DETECTED (A) NOT DETECTED Final   Bordetella pertussis NOT DETECTED NOT DETECTED Final   Bordetella Parapertussis NOT DETECTED NOT DETECTED Final   Chlamydophila pneumoniae NOT DETECTED NOT DETECTED Final   Mycoplasma pneumoniae NOT DETECTED NOT DETECTED Final    Comment: Performed at [facility], [address] [place], [place] [address]  Resp panel by RT-PCR (RSV, Flu A&B, Covid) Anterior Nasal Swab     Status: Abnormal   Collection Time: 05/14/22 12:48 PM   Specimen: Anterior Nasal Swab  Result Value Ref Range Status   SARS Coronavirus 2 by RT PCR NEGATIVE NEGATIVE Final    Comment:  (NOTE) SARS-CoV-2 target nucleic acids are NOT DETECTED.  The SARS-CoV-2 RNA is generally detectable in upper respiratory specimens during the acute phase of infection. The lowest concentration of SARS-CoV-2 viral copies this assay can detect is 138 copies/mL. A negative result does not preclude SARS-Cov-2 infection and should not be used as the sole basis for treatment or other patient management decisions. A negative result may occur with  improper specimen collection/handling, submission of specimen other than nasopharyngeal swab, presence of viral mutation(s) within the areas targeted by this assay, and inadequate number of viral copies(<138 copies/mL). A negative result must be combined with clinical observations, patient history, and epidemiological information. The expected result is Negative.  Fact Sheet for Patients:  EntrepreneurPulse.com.au  Fact Sheet for Healthcare Providers:  IncredibleEmployment.be  This test is no t yet approved or cleared by the [place] FDA and  has been authorized for detection and/or diagnosis of SARS-CoV-2 by FDA under an Emergency Use Authorization (EUA). This EUA will remain  in effect (meaning this test can be used) for the duration of the COVID-19 declaration under Section 564(b)(1) of the Act, 21 U.S.C.section 360bbb-3(b)(1), unless the authorization is  terminated  or revoked sooner.       Influenza A by PCR NEGATIVE NEGATIVE Final   Influenza B by PCR NEGATIVE NEGATIVE Final    Comment: (NOTE) The Xpert Xpress SARS-CoV-2/FLU/RSV plus assay is intended as an aid in the diagnosis of influenza from Nasopharyngeal swab specimens and should not be used as a sole basis for treatment. Nasal washings and aspirates are unacceptable for Xpert Xpress SARS-CoV-2/FLU/RSV testing.  Fact Sheet for Patients: EntrepreneurPulse.com.au  Fact Sheet for Healthcare  Providers: IncredibleEmployment.be  This test is not yet approved or cleared by the [place] FDA and has been authorized for detection and/or diagnosis of SARS-CoV-2 by FDA under an Emergency Use Authorization (EUA). This EUA will remain in effect (meaning this test can be used) for the duration of the COVID-19 declaration under Section 564(b)(1) of the Act, 21 U.S.C. section 360bbb-3(b)(1), unless the authorization is terminated or revoked.     Resp Syncytial Virus by PCR POSITIVE (A) NEGATIVE Final    Comment: (NOTE) Fact Sheet for Patients: EntrepreneurPulse.com.au  Fact Sheet for Healthcare Providers: IncredibleEmployment.be  This test is not yet approved or cleared by the [place] FDA and has been authorized for detection and/or diagnosis of SARS-CoV-2 by FDA under an Emergency Use Authorization (EUA). This EUA will remain in effect (meaning this test can be used) for the duration of the COVID-19 declaration under Section 564(b)(1) of the Act, 21 U.S.C. section 360bbb-3(b)(1), unless the authorization is terminated or revoked.  Performed at [facility], [place] [address], [place], [place] [address]      Radiology Studies: No results found.  Scheduled Meds:  (feeding supplement) PROSource Plus  30 mL Oral BID BM   vitamin B-12  500 mcg Oral Daily   enoxaparin (LOVENOX) injection  40 mg Subcutaneous Q24H   feeding supplement (KATE FARMS STANDARD 1.4)  325 mL Oral BID BM   haloperidol  1 mg Oral BID   hydrocerin   Topical BID   Continuous Infusions:   LOS: 148 days   Shelly Coss, MD [facility] P2/04/2022, 10:27 AM

## 2022-05-24 NOTE — Progress Notes (Signed)
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  Psychiatry and Neurology evaluated patient during her stay.  Patient is medically stable for discharge and is awaiting placement at a memory care facility.   Major events: 09/07/2021 admitted to behavioral health.  Treated with Zyprexa Invega and Ativan challenge for catatonia. 12/24/2021 sent to ER from [facility] for evaluation for tachycardia 9/04-9/5, 9/5>9/9, LTM EEG  no seizure. TSH stable CTA chest no PE no pneumonia RPR nonreactive bilateral LE Doppler negative for DVT Echo with normal EF  9/23 febrile secondary to Covid 19 + no pneumonia. 9/25 repeat CT brain unremarkable. 05/14/2022 Febrile. Noted to be +RSV.    Assessment & Plan:  Principal Problem:   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) Active Problems:   Delirium due to multiple etiologies, acute, hyperactive   Bipolar affective disorder, current episode manic with psychotic symptoms (HCC)   Sinus tachycardia   Tobacco abuse   Altered mental state   B12 deficiency   COVID-19 virus infection   Drooling   Essential hypertension  Acute metabolic encephalopathy on the background of chronic progressive dementia: Currently mental status at baseline.  Patient is alert, awake but does not communicate.  Ambulates freely during the day.  Able to feed by herself.  Continue close supervision as she has unintentionally eloped.   Recent RSV: Noted to be RSV positive on 1/23.  She was febrile at the time.  Currently asymptomatic  Bipolar disorder: Currently mood looks stable.  Continue Haldol, zyprexa ,hydroxyzine and Ativan as needed.Doesnot communicate  Hypertension: Currently off  medications.  Currently BP stable.  CKD stage IIIb: Currently kidney function is stable.  Monitor intermittently  Vitamin B12 deficiency: Continue oral supplementation  Sinus tachycardia: Currently stable.  Propranolol discontinued due to hypotension/bradycardia.  Monitor  Disposition/debility/deconditioning: Medically stable for discharge.  Pending placement.  TOC following.         DVT prophylaxis:enoxaparin (LOVENOX) injection 40 mg Start: 12/26/21 1200     Code Status: Full Code  Family Communication: None at bedside  Patient status:Inpatient  Patient is from : Outside behavioral Hospital  Anticipated discharge to: Long-term memory care  Estimated DC date: Not sure   Consultants: None  Procedures: None  Antimicrobials:  Anti-infectives (From admission, onward)    None       Subjective:  Seen and examined at bedside today.  Same as yesterday.  Sitter at the bedside.  Awake but not alert or oriented.  Staring blankly.  Not agitated.  Objective: Vitals:   05/21/22 2014 05/22/22 0423 05/23/22 0513 05/24/22 0520  BP: 135/81 128/82 (!) 113/95 114/71  Pulse: 61 99 (!) 106 (!) 107  Resp: 16 16 17 17   Temp: 98.6 F (37 C) 97.6 F (36.4 C) 98 F (36.7 C) 98 F (36.7 C)  TempSrc: Oral Oral    SpO2: 92% 98%  99%  Weight:      Height:       No intake or output data in the 24 hours ending 05/24/22 1101 Filed Weights   02/03/22 2025  Weight: 72 kg    Examination:  General exam: Lying in bed, , Not alert or oriented, does not speak, appears comfortable   Data Reviewed: I  have personally reviewed following labs and imaging studies  CBC: No results for input(s): "WBC", "NEUTROABS", "HGB", "HCT", "MCV", "PLT" in the last 168 hours. Basic Metabolic Panel: No results for input(s): "NA", "K", "CL", "CO2", "GLUCOSE", "BUN", "CREATININE", "CALCIUM", "MG", "PHOS" in the last 168 hours.   Recent Results (from the past 240 hour(s))  Respiratory (~20  pathogens) panel by PCR     Status: Abnormal   Collection Time: 05/14/22 12:48 PM   Specimen: Nasopharyngeal Swab; Respiratory  Result Value Ref Range Status   Adenovirus NOT DETECTED NOT DETECTED Final   Coronavirus 229E NOT DETECTED NOT DETECTED Final    Comment: (NOTE) The Coronavirus on the Respiratory Panel, DOES NOT test for the novel  Coronavirus (2019 nCoV)    Coronavirus HKU1 NOT DETECTED NOT DETECTED Final   Coronavirus NL63 NOT DETECTED NOT DETECTED Final   Coronavirus OC43 NOT DETECTED NOT DETECTED Final   Metapneumovirus NOT DETECTED NOT DETECTED Final   Rhinovirus / Enterovirus NOT DETECTED NOT DETECTED Final   Influenza A NOT DETECTED NOT DETECTED Final   Influenza B NOT DETECTED NOT DETECTED Final   Parainfluenza Virus 1 NOT DETECTED NOT DETECTED Final   Parainfluenza Virus 2 NOT DETECTED NOT DETECTED Final   Parainfluenza Virus 3 NOT DETECTED NOT DETECTED Final   Parainfluenza Virus 4 NOT DETECTED NOT DETECTED Final   Respiratory Syncytial Virus DETECTED (A) NOT DETECTED Final   Bordetella pertussis NOT DETECTED NOT DETECTED Final   Bordetella Parapertussis NOT DETECTED NOT DETECTED Final   Chlamydophila pneumoniae NOT DETECTED NOT DETECTED Final   Mycoplasma pneumoniae NOT DETECTED NOT DETECTED Final    Comment: Performed at [facility], [address] [place], [place] [address]  Resp panel by RT-PCR (RSV, Flu A&B, Covid) Anterior Nasal Swab     Status: Abnormal   Collection Time: 05/14/22 12:48 PM   Specimen: Anterior Nasal Swab  Result Value Ref Range Status   SARS Coronavirus 2 by RT PCR NEGATIVE NEGATIVE Final    Comment: (NOTE) SARS-CoV-2 target nucleic acids are NOT DETECTED.  The SARS-CoV-2 RNA is generally detectable in upper respiratory specimens during the acute phase of infection. The lowest concentration of SARS-CoV-2 viral copies this assay can detect is 138 copies/mL. A negative result does not preclude SARS-Cov-2 infection and should not  be used as the sole basis for treatment or other patient management decisions. A negative result may occur with  improper specimen collection/handling, submission of specimen other than nasopharyngeal swab, presence of viral mutation(s) within the areas targeted by this assay, and inadequate number of viral copies(<138 copies/mL). A negative result must be combined with clinical observations, patient history, and epidemiological information. The expected result is Negative.  Fact Sheet for Patients:  EntrepreneurPulse.com.au  Fact Sheet for Healthcare Providers:  IncredibleEmployment.be  This test is no t yet approved or cleared by the [place] FDA and  has been authorized for detection and/or diagnosis of SARS-CoV-2 by FDA under an Emergency Use Authorization (EUA). This EUA will remain  in effect (meaning this test can be used) for the duration of the COVID-19 declaration under Section 564(b)(1) of the Act, 21 U.S.C.section 360bbb-3(b)(1), unless the authorization is terminated  or revoked sooner.       Influenza A by PCR NEGATIVE NEGATIVE Final   Influenza B by PCR NEGATIVE NEGATIVE Final    Comment: (NOTE) The Xpert Xpress SARS-CoV-2/FLU/RSV plus assay is intended as an aid in the diagnosis of influenza from Nasopharyngeal swab specimens and should  not be used as a sole basis for treatment. Nasal washings and aspirates are unacceptable for Xpert Xpress SARS-CoV-2/FLU/RSV testing.  Fact Sheet for Patients: EntrepreneurPulse.com.au  Fact Sheet for Healthcare Providers: IncredibleEmployment.be  This test is not yet approved or cleared by the [place] FDA and has been authorized for detection and/or diagnosis of SARS-CoV-2 by FDA under an Emergency Use Authorization (EUA). This EUA will remain in effect (meaning this test can be used) for the duration of the COVID-19 declaration under Section  564(b)(1) of the Act, 21 U.S.C. section 360bbb-3(b)(1), unless the authorization is terminated or revoked.     Resp Syncytial Virus by PCR POSITIVE (A) NEGATIVE Final    Comment: (NOTE) Fact Sheet for Patients: EntrepreneurPulse.com.au  Fact Sheet for Healthcare Providers: IncredibleEmployment.be  This test is not yet approved or cleared by the [place] FDA and has been authorized for detection and/or diagnosis of SARS-CoV-2 by FDA under an Emergency Use Authorization (EUA). This EUA will remain in effect (meaning this test can be used) for the duration of the COVID-19 declaration under Section 564(b)(1) of the Act, 21 U.S.C. section 360bbb-3(b)(1), unless the authorization is terminated or revoked.  Performed at [facility], [place] [address] [place], [place] [address]      Radiology Studies: No results found.  Scheduled Meds:  (feeding supplement) PROSource Plus  30 mL Oral BID BM   vitamin B-12  500 mcg Oral Daily   enoxaparin (LOVENOX) injection  40 mg Subcutaneous Q24H   feeding supplement (KATE FARMS STANDARD 1.4)  325 mL Oral BID BM   haloperidol  1 mg Oral BID   hydrocerin   Topical BID   Continuous Infusions:   LOS: 149 days   Shelly Coss, MD [facility] P2/05/2022, 11:01 AM

## 2022-05-24 NOTE — Plan of Care (Signed)
  Problem: Clinical Measurements: Goal: Will remain free from infection Outcome: Progressing Goal: Respiratory complications will improve Outcome: Progressing   Problem: Activity: Goal: Risk for activity intolerance will decrease Outcome: Progressing   Problem: Pain Managment: Goal: General experience of comfort will improve Outcome: Progressing   Problem: Safety: Goal: Ability to remain free from injury will improve Outcome: Progressing   Problem: Skin Integrity: Goal: Risk for impaired skin integrity will decrease Outcome: Progressing   

## 2022-05-24 NOTE — Progress Notes (Signed)
CSW attempted to reach Marion at [facility] without success - a voicemail was left requesting a return call.  CSW attempted to reach Hydia at [facility] without success - no voicemail option available so a text message was sent requesting a return call.  Madilyn Fireman, MSW, LCSW Transitions of Care  Clinical Social Worker II [phone]

## 2022-05-25 NOTE — Progress Notes (Signed)
Mobility Specialist Progress Note:   05/25/22 1215  Mobility  Activity Ambulated with assistance in hallway  Level of Assistance Contact guard assist, steadying assist  Assistive Device Other (Comment) (HHA)  Distance Ambulated (ft) 500 ft  Activity Response Tolerated well  Mobility Referral Yes  $Mobility charge 1 Mobility   Pt agreeable to mobility session. HHA required for comfort. Pt spoke few words during session, back in bed with all needs met. Sitter present.   Nelta Numbers Mobility Specialist Please contact via SecureChat or  Rehab office at [phone]

## 2022-05-25 NOTE — Plan of Care (Signed)

## 2022-05-25 NOTE — Progress Notes (Signed)
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 Transferred to [facility] hospital on 12/24/21 for AMS with possible catatonia. Psychiatry and Neurology evaluated patient during her stay.  Patient is medically stable for discharge and is awaiting placement at a memory care facility.     Major events: 09/07/2021 admitted to behavioral health.  Treated with Zyprexa Invega and Ativan challenge for catatonia. 12/24/2021 sent to ER from [facility] for evaluation for tachycardia 9/04-9/5, 9/5>9/9, LTM EEG  no seizure. TSH stable CTA chest no PE no pneumonia RPR nonreactive bilateral LE Doppler negative for DVT Echo with normal EF  9/23 febrile secondary to Covid 19 + no pneumonia. 9/25 repeat CT brain unremarkable. 05/14/2022 Febrile. Noted to be +RSV.    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  Patient is alert, awake but does not communicate effectively Ambulates freely during the day.  Able to feed by herself.  Continue close supervision as she has unintentionally eloped.   Recent RSV Noted to be RSV positive on 1/23.  She was febrile at the time.  Currently asymptomatic  Bipolar disorder Currently mood looks stable.  Continue Haldol,  zyprexa ,hydroxyzine and Ativan as needed. Does not communicate  Hypertension Currently off medications.  Currently BP stable  CKD stage IIIb Currently kidney function is stable.  Monitor intermittently  Vitamin B12 deficiency Continue oral supplementation  Sinus tachycardia Currently stable.  Propranolol discontinued due to hypotension/bradycardia.  Monitor  Disposition/debility/deconditioning Medically stable for discharge.  Pending placement.  TOC following.         DVT prophylaxis:enoxaparin (LOVENOX) injection 40 mg Start: 12/26/21 1200     Code Status: Full Code  Family Communication: None at bedside  Patient status:Inpatient  Patient is from : Outside behavioral Hospital  Anticipated discharge to: Long-term memory care  Estimated DC date: Not sure   Consultants: None  Procedures: None  Antimicrobials:  Anti-infectives (From admission, onward)    None       Subjective:  Met patient sleeping at bedside, easily arousable   Objective: Vitals:   05/24/22 0520 05/25/22 0420 05/25/22 0750 05/25/22 1048  BP: 114/71 108/70 124/60 (!) 138/102  Pulse: (!) 107 97 (!) 108 (!) 144  Resp: 17 17 19 19   Temp: 98 F (36.7 C) 98.3 F (36.8 C) 98.7 F (37.1 C) 98.7 F (37.1 C)  TempSrc:   Oral Oral  SpO2: 99% 100% 99% 98%  Weight:      Height:        Intake/Output Summary (Last 24 hours) at 05/25/2022 1714 Last data filed at 05/25/2022 1238 Gross per 24 hour  Intake 360 ml  Output --  Net 360 ml   Filed Weights   02/03/22 2025  Weight: 72 kg    Examination:  General:  NAD  Cardiovascular: S1, S2 present Respiratory: CTAB Abdomen: Soft, nontender, nondistended, bowel sounds present Musculoskeletal: No bilateral pedal edema noted Skin: Normal Psychiatry: Unable to assess   Data Reviewed: I have personally reviewed following labs and imaging studies  CBC: No results for input(s): "WBC", "NEUTROABS", "HGB", "HCT", "MCV", "PLT" in the last  168 hours. Basic Metabolic Panel: No results for input(s): "NA", "K", "CL", "CO2", "GLUCOSE", "BUN", "CREATININE", "CALCIUM", "MG", "PHOS" in the last 168 hours.   No results found for this or any previous visit (from the past 240 hour(s)).    Radiology Studies: No results found.  Scheduled Meds:  (feeding supplement) PROSource Plus  30 mL Oral BID BM   vitamin B-12  500 mcg Oral Daily   enoxaparin (LOVENOX) injection  40 mg Subcutaneous Q24H   feeding supplement (KATE FARMS STANDARD 1.4)  325 mL Oral BID BM   haloperidol  1 mg Oral BID   hydrocerin   Topical BID   Continuous Infusions:   LOS: 150 days   Alma Friendly, MD [facility] P2/06/2022, 5:14 PM

## 2022-05-26 NOTE — Progress Notes (Signed)
Patient wears adult pull up, changed at this time

## 2022-05-26 NOTE — Progress Notes (Signed)
PROGRESS NOTE  Natalie Brown  BMW:[id number] DOB: Sep 18, 1965 DOA: 12/24/2021 PCP: Marliss Coots, NP   Brief Narrative: Patient  is a 57 yo female with a PMH of Bipolar Affective Disorder and Chronic Dementia with Advanced Cerebral Atrophy and Behavioral Disturbances who was admitted on 09/07/21 with acute mania and aggressive behavior to [facility]. Transferred to [facility] hospital on 12/24/21 for AMS with possible catatonia. Psychiatry and Neurology evaluated patient during her stay.  Patient is medically stable for discharge and is awaiting placement at a memory care facility.     Major events: 09/07/2021 admitted to behavioral health.  Treated with Zyprexa Invega and Ativan challenge for catatonia. 12/24/2021 sent to ER from [facility] for evaluation for tachycardia 9/04-9/5, 9/5>9/9, LTM EEG  no seizure. TSH stable CTA chest no PE no pneumonia RPR nonreactive bilateral LE Doppler negative for DVT Echo with normal EF  9/23 febrile secondary to Covid 19 + no pneumonia. 9/25 repeat CT brain unremarkable. 05/14/2022 Febrile. Noted to be +RSV.    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  Patient is alert, awake but does not communicate effectively Ambulates freely during the day.  Able to feed by herself.  Continue close supervision as she has unintentionally eloped.   Recent RSV Noted to be RSV positive on 1/23.  She was febrile at the time.  Currently asymptomatic  Bipolar disorder Currently mood looks stable.  Continue Haldol,  zyprexa ,hydroxyzine and Ativan as needed. Does not communicate  Hypertension Currently off medications.  Currently BP stable  CKD stage IIIb Currently kidney function is stable.  Monitor intermittently  Vitamin B12 deficiency Continue oral supplementation  Sinus tachycardia Currently stable.  Propranolol discontinued due to hypotension/bradycardia.  Monitor  Disposition/debility/deconditioning Medically stable for discharge.  Pending placement.  TOC following.         DVT prophylaxis:enoxaparin (LOVENOX) injection 40 mg Start: 12/26/21 1200     Code Status: Full Code  Family Communication: None at bedside  Patient status:Inpatient  Patient is from : Outside behavioral Hospital  Anticipated discharge to: Long-term memory care  Estimated DC date: Not sure   Consultants: None  Procedures: None  Antimicrobials:  Anti-infectives (From admission, onward)    None       Subjective: Met patient sleeping, easily arousable, refused to talk     Objective: Vitals:   05/25/22 1813 05/25/22 1917 05/26/22 0445 05/26/22 0700  BP: 112/74 99/65 97/69  119/86  Pulse: (!) 101 98 99 (!) 106  Resp: 19 19 16 17   Temp: 98.7 F (37.1 C) 98.7 F (37.1 C) 98.5 F (36.9 C) 97.8 F (36.6 C)  TempSrc: Oral Oral Oral Axillary  SpO2: 98% 97% 99% 92%  Weight:      Height:        Intake/Output Summary (Last 24 hours) at 05/26/2022 1020 Last data filed at 05/26/2022 0900 Gross per 24 hour  Intake 410 ml  Output --  Net 410 ml   Filed Weights   02/03/22 2025  Weight: 72 kg    Examination:  General:  NAD  Cardiovascular: S1, S2 present Respiratory: CTAB Abdomen: Soft, nontender, nondistended, bowel sounds present Musculoskeletal: No bilateral pedal edema noted Skin: Normal Psychiatry: Unable to assess   Data Reviewed: I have personally reviewed following labs and imaging studies  CBC: No results for input(s): "WBC", "NEUTROABS", "HGB", "HCT", "MCV", "PLT" in the  last 168 hours. Basic Metabolic Panel: No results for input(s): "NA", "K", "CL", "CO2", "GLUCOSE", "BUN", "CREATININE", "CALCIUM", "MG", "PHOS" in the last 168 hours.   No results found for this or any previous visit (from the past 240 hour(s)).    Radiology Studies: No results found.  Scheduled Meds:  (feeding supplement) PROSource Plus  30 mL Oral BID BM   vitamin B-12  500 mcg Oral Daily   enoxaparin (LOVENOX) injection  40 mg Subcutaneous Q24H   feeding supplement (KATE FARMS STANDARD 1.4)  325 mL Oral BID BM   haloperidol  1 mg Oral BID   hydrocerin   Topical BID   Continuous Infusions:   LOS: 151 days   Alma Friendly, MD [facility] P2/07/2022, 10:20 AM

## 2022-05-27 LAB — MAGNESIUM: Magnesium: 2.2 mg/dL (ref 1.7–2.4)

## 2022-05-27 LAB — CBC WITH DIFFERENTIAL/PLATELET
Abs Immature Granulocytes: 0.02 10*3/uL (ref 0.00–0.07)
Basophils Absolute: 0.1 10*3/uL (ref 0.0–0.1)
Basophils Relative: 1 %
Eosinophils Absolute: 0.2 10*3/uL (ref 0.0–0.5)
Eosinophils Relative: 3 %
HCT: 40.3 % (ref 36.0–46.0)
Hemoglobin: 13.7 g/dL (ref 12.0–15.0)
Immature Granulocytes: 0 %
Lymphocytes Relative: 34 %
Lymphs Abs: 2.6 10*3/uL (ref 0.7–4.0)
MCH: 29.5 pg (ref 26.0–34.0)
MCHC: 34 g/dL (ref 30.0–36.0)
MCV: 86.9 fL (ref 80.0–100.0)
Monocytes Absolute: 0.4 10*3/uL (ref 0.1–1.0)
Monocytes Relative: 6 %
Neutro Abs: 4.3 10*3/uL (ref 1.7–7.7)
Neutrophils Relative %: 56 %
Platelets: 413 10*3/uL — ABNORMAL HIGH (ref 150–400)
RBC: 4.64 MIL/uL (ref 3.87–5.11)
RDW: 13.5 % (ref 11.5–15.5)
WBC: 7.7 10*3/uL (ref 4.0–10.5)
nRBC: 0 % (ref 0.0–0.2)

## 2022-05-27 LAB — BASIC METABOLIC PANEL
Anion gap: 9 (ref 5–15)
BUN: 22 mg/dL — ABNORMAL HIGH (ref 6–20)
CO2: 27 mmol/L (ref 22–32)
Calcium: 9.1 mg/dL (ref 8.9–10.3)
Chloride: 106 mmol/L (ref 98–111)
Creatinine, Ser: 1.2 mg/dL — ABNORMAL HIGH (ref 0.44–1.00)
GFR, Estimated: 53 mL/min — ABNORMAL LOW (ref >60)
Glucose, Bld: 114 mg/dL — ABNORMAL HIGH (ref 70–99)
Potassium: 4.7 mmol/L (ref 3.5–5.1)
Sodium: 142 mmol/L (ref 135–145)

## 2022-05-27 NOTE — Progress Notes (Addendum)
1:50pm: [facility] cannot accept patient due to recent behavior concerns.  CSW spoke with Hydia at [facility] who states there are beds available.  11:30am: CSW spoke with Pamala Hurry at [facility] who states there are no female beds available at this time.  CSW spoke with Nevin Bloodgood at [facility] who is agreeable to review patient's clinicals to determine if a bed offer can be made.  Madilyn Fireman, MSW, LCSW Transitions of Care  Clinical Social Worker II [phone]

## 2022-05-27 NOTE — Progress Notes (Signed)
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 Transferred to [facility] hospital on 12/24/21 for AMS with possible catatonia. Psychiatry and Neurology evaluated patient during her stay.  Patient is medically stable for discharge and is awaiting placement at a memory care facility.     Major events: 09/07/2021 admitted to behavioral health.  Treated with Zyprexa Invega and Ativan challenge for catatonia. 12/24/2021 sent to ER from [facility] for evaluation for tachycardia 9/04-9/5, 9/5>9/9, LTM EEG  no seizure. TSH stable CTA chest no PE no pneumonia RPR nonreactive bilateral LE Doppler negative for DVT Echo with normal EF  9/23 febrile secondary to Covid 19 + no pneumonia. 9/25 repeat CT brain unremarkable. 05/14/2022 Febrile. Noted to be +RSV.    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  Patient is alert, awake but does not communicate effectively Ambulates freely during the day.  Able to feed by herself.  Continue close supervision as she has unintentionally eloped.   Recent RSV Noted to be RSV positive on 1/23.  She was febrile at the time.  Currently asymptomatic  Bipolar disorder Currently mood looks stable.  Continue  Haldol, zyprexa ,hydroxyzine and Ativan as needed. Does not communicate  Hypertension Currently off medications.  Currently BP stable  CKD stage IIIb Currently kidney function is stable.  Monitor intermittently  Vitamin B12 deficiency Continue oral supplementation  Sinus tachycardia Currently stable.  Propranolol discontinued due to hypotension/bradycardia.  Monitor  Disposition/debility/deconditioning Medically stable for discharge.  Pending placement.  TOC following.         DVT prophylaxis:enoxaparin (LOVENOX) injection 40 mg Start: 12/26/21 1200     Code Status: Full Code  Family Communication: None at bedside  Patient status:Inpatient  Patient is from : Outside behavioral Hospital  Anticipated discharge to: Long-term memory care  Estimated DC date: Not sure   Consultants: None  Procedures: None  Antimicrobials:  Anti-infectives (From admission, onward)    None       Subjective: Patient sleeping. Had her visitor Merry Proud come earlier on, were she did some puzzle with him   Objective: Vitals:   05/26/22 0445 05/26/22 0700 05/26/22 1647 05/26/22 2003  BP: 97/69 119/86 98/67 97/63   Pulse: 99 (!) 106 68 65  Resp: 16 17 16 16   Temp: 98.5 F (36.9 C) 97.8 F (36.6 C) (!) 97.5 F (36.4 C) 97.7 F (36.5 C)  TempSrc: Oral Axillary  Axillary  SpO2: 99% 92% 97% 99%  Weight:      Height:        Intake/Output Summary (Last 24 hours) at 05/27/2022 1245 Last data filed at 05/27/2022 0830 Gross per 24 hour  Intake 350 ml  Output --  Net 350 ml   Filed Weights   02/03/22 2025  Weight: 72  kg    Examination:  General: NAD  Cardiovascular: S1, S2 present Respiratory: CTAB Abdomen: Soft, nontender, nondistended, bowel sounds present Musculoskeletal: No bilateral pedal edema noted Skin: Normal Psychiatry: Unable to assess   Data Reviewed: I have personally reviewed following labs and imaging studies  CBC: No results for input(s): "WBC",  "NEUTROABS", "HGB", "HCT", "MCV", "PLT" in the last 168 hours. Basic Metabolic Panel: No results for input(s): "NA", "K", "CL", "CO2", "GLUCOSE", "BUN", "CREATININE", "CALCIUM", "MG", "PHOS" in the last 168 hours.   No results found for this or any previous visit (from the past 240 hour(s)).    Radiology Studies: No results found.  Scheduled Meds:  (feeding supplement) PROSource Plus  30 mL Oral BID BM   vitamin B-12  500 mcg Oral Daily   enoxaparin (LOVENOX) injection  40 mg Subcutaneous Q24H   feeding supplement (KATE FARMS STANDARD 1.4)  325 mL Oral BID BM   haloperidol  1 mg Oral BID   hydrocerin   Topical BID   Continuous Infusions:   LOS: 152 days   Alma Friendly, MD [facility] P2/08/2022, 12:45 PM

## 2022-05-28 NOTE — Progress Notes (Signed)
PROGRESS NOTE  Natalie Brown  TFT:[id number] DOB: December 05, 1965 DOA: 12/24/2021 PCP: Marliss Coots, NP    Brief Narrative: Patient  is a 57 yo female with a PMH of Bipolar Affective Disorder and Chronic Dementia with Advanced Cerebral Atrophy and Behavioral Disturbances who was admitted on 09/07/21 with acute mania and aggressive behavior to [facility]. Transferred to [facility] hospital on 12/24/21 for AMS with possible catatonia. Psychiatry and Neurology evaluated patient during her stay.  Patient is medically stable for discharge and is awaiting placement at a memory care facility.     Major events: 09/07/2021 admitted to behavioral health.  Treated with Zyprexa Invega and Ativan challenge for catatonia. 12/24/2021 sent to ER from [facility] for evaluation for tachycardia 9/04-9/5, 9/5>9/9, LTM EEG  no seizure. TSH stable CTA chest no PE no pneumonia RPR nonreactive bilateral LE Doppler negative for DVT Echo with normal EF  9/23 febrile secondary to Covid 19 + no pneumonia. 9/25 repeat CT brain unremarkable. 05/14/2022 Febrile. Noted to be +RSV.    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  Patient is alert, awake but does not communicate effectively Ambulates freely during the day.  Able to feed by herself.  Continue close supervision as she has unintentionally eloped.   Recent RSV Noted to be RSV positive on 1/23.  She was febrile at the time.  Currently asymptomatic  Bipolar disorder Currently mood looks stable.  Continue  Haldol, zyprexa ,hydroxyzine and Ativan as needed. Does not communicate  Hypertension Currently off medications.  Currently BP stable  CKD stage IIIb Currently kidney function is stable.  Monitor intermittently  Vitamin B12 deficiency Continue oral supplementation  Sinus tachycardia Currently stable.  Propranolol discontinued due to hypotension/bradycardia.  Monitor  Disposition/debility/deconditioning Medically stable for discharge.  Pending placement.  TOC following.         DVT prophylaxis:enoxaparin (LOVENOX) injection 40 mg Start: 12/26/21 1200     Code Status: Full Code  Family Communication: None at bedside  Patient status:Inpatient  Patient is from : Outside behavioral Hospital  Anticipated discharge to: Long-term memory care  Estimated DC date: Not sure   Consultants: None  Procedures: None  Antimicrobials:  Anti-infectives (From admission, onward)    None       Subjective: Noted to be sleeping, didn't wake patient up   Objective: Vitals:   05/27/22 0830 05/27/22 1700 05/27/22 2018 05/28/22 0739  BP: 121/64 115/60 109/65 102/63  Pulse: 66 67 87 83  Resp: 18 18 18 18   Temp: 97.8 F (36.6 C) 98.2 F (36.8 C) 98.2 F (36.8 C) 97.6 F (36.4 C)  TempSrc: Axillary Axillary Oral Oral  SpO2: 98% 99% 100% 100%  Weight:      Height:        Intake/Output Summary (Last 24 hours) at 05/28/2022 1413 Last data filed at 05/28/2022 1000 Gross per 24 hour  Intake 360 ml  Output --  Net 360 ml   Filed Weights   02/03/22 2025  Weight: 72 kg    Examination:  General: NAD  Cardiovascular:  S1, S2 present Respiratory: CTAB Abdomen: Soft, nontender, nondistended, bowel sounds present Musculoskeletal: No bilateral pedal edema noted Skin: Normal Psychiatry: Unable to assess   Data Reviewed: I have personally reviewed following labs and imaging studies  CBC: Recent Labs  Lab 05/27/22 1340  WBC 7.7  NEUTROABS 4.3  HGB 13.7  HCT 40.3  MCV  86.9  PLT 037*   Basic Metabolic Panel: Recent Labs  Lab 05/27/22 1340  NA 142  K 4.7  CL 106  CO2 27  GLUCOSE 114*  BUN 22*  CREATININE 1.20*  CALCIUM 9.1  MG 2.2     No results found for this or any previous visit (from the past 240 hour(s)).    Radiology Studies: No results found.  Scheduled Meds:  (feeding supplement) PROSource Plus  30 mL Oral BID BM   vitamin B-12  500 mcg Oral Daily   enoxaparin (LOVENOX) injection  40 mg Subcutaneous Q24H   feeding supplement (KATE FARMS STANDARD 1.4)  325 mL Oral BID BM   haloperidol  1 mg Oral BID   hydrocerin   Topical BID   Continuous Infusions:   LOS: 153 days   Alma Friendly, MD [facility] P2/09/2022, 2:13 PM

## 2022-05-28 NOTE — Progress Notes (Signed)
Neurology Progress Notes Brief HPI They had concerns for catatonia and was given 2mg  IM ativan challenge with minimal improvement. Due to concerns for her decompensation and acute mental status change she was send to ED for further work up.  Natalie Brown is a 57 y.o. female with a past medical history of hypertension and bipolar 1 with moderate mania presenting from behavioral health Hospital with concern for an acute change in mental status.   Subjective: Seen in her room and still connected to LTM.  [name] in bilateral wrist restraints.  [name] able to answer questions appropriately, but is quick to become agitated   Exam:     Vitals:    12/25/21 0522 12/25/21 0926  BP: 109/79 115/76  Pulse: 83 88  Resp: 16 18  Temp: 98 F (36.7 C) 97.6 F (36.4 C)  SpO2: 100% 100%    Gen: In bed, NAD Resp: non-labored breathing, no acute distress Abd: soft, nt   Neuro: NEURO:  Mental Status: Drowsy, awakens to voice, able to state name, age, her mothers name and age. Unable to tell me the date or her situation correctly.  Intermittently agitated and difficult to redirect.  She does fall asleep during exam and requires frequent reorientation and prompting to complete commands Language: Intermittently pressured speech, does not consistently answer questions appropriately, poor attention and concentration Cranial Nerves: PERRL 40mm/brisk. EOMI, visual fields full, no facial asymmetry, facial sensation intact, hearing intact, tongue/uvula/soft palate midline, normal sternocleidomastoid and trapezius muscle strength. No evidence of tongue atrophy or fibrillations Motor:  Tone: is normal and bulk is normal Sensation- Intact to light touch bilaterally DTR: 2+ throughout Coordination: movements are slowed with upper extremities. Does not follow commands to move lower extremities.  Gait- deferred     Pertinent Labs: CK- 256 Ammonia 39 Creatinine 1.05 B12-290   EEG: 12/24/2021 1831 to 12/25/2021  0844 This study is within normal limits. No seizures or epileptiform discharges were seen throughout the recording.   Impression: Progression of dementia versus seizure versus metabolic encephalopathy vs polypharmacy 57 year old female past medical history of hypertension and bipolar 1 with moderate mania presenting from [facility] with an acute change in mental status.  She did have an Ativan challenge for catatonia at the behavioral health hospital with minimal change in mental status.  Infectious work-up has been negative so far, will complete delirium work-up and obtain an EEG to rule out seizure activity as an etiology for her waxing and waning symptoms.  Given her significant atrophy this may represent progression of her dementia, but we will also evaluate for reversible causes of the same.  Given the chronicity of her symptoms I do not feel that the risk of an LP would outweigh the benefits.  It is possible that she was receiving some benefit from her Aricept and discontinuation of this led to some decompensation, however neurology's focus will be on ruling out reversible causes of dementia     Recommendations: - cEEG - Appreciate management of antipsychotics and mood medications per psychiatry - Delirium/dementia work-up in progress- B1, B12, MMA - Given low normal B12 will supplement for goal greater than 400.  If MMA results normal this can be discontinued (1000 mcg p.o. daily ordered) - environmental support for delirium: Lights on during the day, patient up and out of bed as much as is feasible, OT/PT, quiet dimly lit room at night, reorient patient often, provide hearing aides and glasses if patient uses them routinely, minimize sleep  disruptions as much as possible overnight.  As much as possible, reorient patient, and have them engage patient in activities, e.g. playing cards. TV should be off or on neutral background music unless patient engaged and watching. Try to keep  interactions with the patient calm and quiet.       Janine Ores, DNP, FNP-BC [facility] Pager: [phone]

## 2022-05-29 NOTE — Progress Notes (Signed)
PROGRESS NOTE  Natalie Brown  ELF:[id number] DOB: 1965-08-05 DOA: 12/24/2021 PCP: Marliss Coots, NP    Brief Narrative: Patient  is a 57 yo female with a PMH of Bipolar Affective Disorder and Chronic Dementia with Advanced Cerebral Atrophy and Behavioral Disturbances who was admitted on 09/07/21 with acute mania and aggressive behavior to [facility]. Transferred to [facility] hospital on 12/24/21 for AMS with possible catatonia. Psychiatry and Neurology evaluated patient during her stay.  Patient is medically stable for discharge and is awaiting placement at a memory care facility.     Major events: 09/07/2021 admitted to behavioral health.  Treated with Zyprexa Invega and Ativan challenge for catatonia. 12/24/2021 sent to ER from [facility] for evaluation for tachycardia 9/04-9/5, 9/5>9/9, LTM EEG  no seizure. TSH stable CTA chest no PE no pneumonia RPR nonreactive bilateral LE Doppler negative for DVT Echo with normal EF  9/23 febrile secondary to Covid 19 + no pneumonia. 9/25 repeat CT brain unremarkable. 05/14/2022 Febrile. Noted to be +RSV.    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  Patient is alert, awake but does not communicate effectively Ambulates freely during the day.  Able to feed by herself.  Continue close supervision as she has unintentionally eloped.   Recent RSV Noted to be RSV positive on 1/23.  She was febrile at the time.  Currently asymptomatic  Bipolar disorder Currently mood looks stable.  Continue  Haldol, zyprexa ,hydroxyzine and Ativan as needed. Does not communicate  Hypertension Currently off medications.  Currently BP stable  CKD stage IIIb Currently kidney function is stable.  Monitor intermittently  Vitamin B12 deficiency Continue oral supplementation  Sinus tachycardia Currently stable.  Propranolol discontinued due to hypotension/bradycardia.  Monitor  Disposition/debility/deconditioning Medically stable for discharge.  Pending placement.  TOC following.         DVT prophylaxis:enoxaparin (LOVENOX) injection 40 mg Start: 12/26/21 1200     Code Status: Full Code  Family Communication: None at bedside  Patient status:Inpatient  Patient is from : Outside behavioral Hospital  Anticipated discharge to: Long-term memory care  Estimated DC date: Not sure   Consultants:  Psychiatry Neurology  Procedures: None  Antimicrobials:  Anti-infectives (From admission, onward)    None       Subjective: No significant events overnight as discussed with staff   Objective: Vitals:   05/28/22 0739 05/28/22 1917 05/29/22 0650 05/29/22 0852  BP: 102/63 106/72 122/78 (!) 114/92  Pulse: 83 95 (!) 132 (!) 109  Resp: 18 18    Temp: 97.6 F (36.4 C) 98.2 F (36.8 C) 98.9 F (37.2 C)   TempSrc: Oral Oral Oral   SpO2: 100% 100% 100% 99%  Weight:      Height:        Intake/Output Summary (Last 24 hours) at 05/29/2022 1108 Last data filed at 05/28/2022 1900 Gross per 24 hour  Intake 240 ml  Output --  Net 240 ml   Filed Weights   02/03/22 2025  Weight: 72 kg    Examination:  Laying in bed no apparent distress, good air entry bilaterally, abdomen soft, extremities with no edema  Data Reviewed: I have personally reviewed following labs and imaging studies  CBC: Recent Labs  Lab 05/27/22 1340  WBC 7.7  NEUTROABS 4.3  HGB 13.7  HCT 40.3  MCV 86.9  PLT 841*   Basic Metabolic Panel: Recent Labs  Lab 05/27/22 1340  NA 142  K 4.7  CL 106   CO2 27  GLUCOSE 114*  BUN 22*  CREATININE 1.20*  CALCIUM 9.1  MG 2.2     No results found for this or any previous visit (from the past 240 hour(s)).    Radiology Studies: No results found.  Scheduled Meds:  (feeding supplement) PROSource Plus  30 mL Oral BID BM   vitamin B-12  500 mcg Oral Daily   enoxaparin (LOVENOX) injection  40 mg Subcutaneous Q24H   feeding supplement (KATE FARMS STANDARD 1.4)  325 mL Oral BID BM   haloperidol  1 mg Oral BID   hydrocerin   Topical BID   Continuous Infusions:   LOS: 154 days   Phillips Climes, MD [facility] P2/10/2022, 11:08 AM

## 2022-05-29 NOTE — Progress Notes (Signed)
Mobility Specialist - Progress Note   05/29/22 1008  Mobility  Activity Ambulated with assistance in room  Level of Assistance Contact guard assist, steadying assist  Assistive Device Other (Comment) (HHA)  Distance Ambulated (ft) 40 ft  Activity Response Tolerated well  $Mobility charge 1 Mobility   Pt received at EOB setting off bed alarm. Pt agreeable to ambulation. No complaints throughout. Pt returned to bed with all needs met and bed alarm set. Sitter present.   Franki Monte  Mobility Specialist Please contact via Solicitor or Rehab office at [phone]

## 2022-05-30 NOTE — Progress Notes (Addendum)
CSW spoke with Caryl Pina at [facility] [place] who states she will review patient's clinicals for a possible offer.  CSW spoke with Mendel Ryder at [facility] who states there are no memory care beds available at this time.  CSW spoke with Nevin Bloodgood at [facility] who states the facility cannot accept patient due her age.  CSW spoke with Dedra of [facility] who states she will review patient's Fl2 and come complete an assessment with her tomorrow afternoon around [place], MSW, LCSW Transitions of Care  Clinical Social Worker II [phone]

## 2022-05-30 NOTE — Progress Notes (Signed)
PROGRESS NOTE  Natalie Brown  ATF:[id number] DOB: [birth year] DOA: 12/24/2021 PCP: Marliss Coots, NP    Brief Narrative:  Patient  is a 57 yo female with a PMH of Bipolar Affective Disorder and Chronic Dementia with Advanced Cerebral Atrophy and Behavioral Disturbances who was admitted on 09/07/21 with acute mania and aggressive behavior to [facility]. Transferred to [facility] hospital on 12/24/21 for AMS with possible catatonia. Psychiatry and Neurology evaluated patient during her stay.  Patient is medically stable for discharge and is awaiting placement at a memory care facility.     Major events: 09/07/2021 admitted to behavioral health.  Treated with Zyprexa Invega and Ativan challenge for catatonia. 12/24/2021 sent to ER from [facility] for evaluation for tachycardia 9/04-9/5, 9/5>9/9, LTM EEG  no seizure. TSH stable CTA chest no PE no pneumonia RPR nonreactive bilateral LE Doppler negative for DVT Echo with normal EF  9/23 febrile secondary to Covid 19 + no pneumonia. 9/25 repeat CT brain unremarkable. 05/14/2022 Febrile. Noted to be +RSV.    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  Patient is alert, awake but does not communicate effectively Ambulates freely during the day.  Able to feed by herself.  Continue close supervision as she has unintentionally eloped.   Recent RSV Noted to be RSV positive on 1/23.  She was febrile at the time.  Currently asymptomatic  Bipolar disorder Currently mood looks stable.  Continue  Haldol, zyprexa ,hydroxyzine and Ativan as needed. Does not communicate  Hypertension Currently off medications.  Currently BP stable  CKD stage IIIb Currently kidney function is stable.  Monitor intermittently  Vitamin B12 deficiency Continue oral supplementation  Sinus tachycardia Currently stable.  Propranolol discontinued due to hypotension/bradycardia.  Monitor  Disposition/debility/deconditioning Medically stable for discharge.  Pending placement.  TOC following.      DVT prophylaxis:enoxaparin (LOVENOX) injection 40 mg Start: 12/26/21 1200     Code Status: Full Code  Family Communication: None at bedside  Patient status:Inpatient  Patient is from : Outside behavioral Hospital  Anticipated discharge to: Long-term memory care  Estimated DC date: Not sure   Consultants:  Psychiatry Neurology  Procedures: None  Antimicrobials:  Anti-infectives (From admission, onward)    None       Subjective:  Difficult events overnight as discussed with staff, she is herself unable to provide any complaints or concerns   Objective: Vitals:   05/29/22 0650 05/29/22 0852 05/29/22 2154 05/30/22 0730  BP: 122/78 (!) 114/92 105/82 102/66  Pulse: (!) 132 (!) 109 97 65  Resp:   17 19  Temp: 98.9 F (37.2 C)  98.6 F (37 C) 98.5 F (36.9 C)  TempSrc: Oral  Oral Axillary  SpO2: 100% 99% 100% 100%  Weight:      Height:       No intake or output data in the 24 hours ending 05/30/22 1059  Filed Weights   02/03/22 2025  Weight: 72 kg    Examination:  Sleeping comfortably in the bed, she has good air entry, abdomen  soft, extremities with no edema  Data Reviewed: I have personally reviewed following labs and imaging studies  CBC: Recent Labs  Lab 05/27/22 1340  WBC 7.7  NEUTROABS 4.3  HGB 13.7  HCT 40.3  MCV 86.9  PLT 748*   Basic Metabolic Panel: Recent Labs  Lab 05/27/22 1340  NA 142  K 4.7  CL 106  CO2 27  GLUCOSE 114*  BUN 22*   CREATININE 1.20*  CALCIUM 9.1  MG 2.2     No results found for this or any previous visit (from the past 240 hour(s)).    Radiology Studies: No results found.  Scheduled Meds:  (feeding supplement) PROSource Plus  30 mL Oral BID BM   vitamin B-12  500 mcg Oral Daily   enoxaparin (LOVENOX) injection  40 mg Subcutaneous Q24H   feeding supplement (KATE FARMS STANDARD 1.4)  325 mL Oral BID BM   haloperidol  1 mg Oral BID   hydrocerin   Topical BID   Continuous Infusions:   LOS: 155 days   Phillips Climes, MD [facility] P2/11/2022, 10:59 AM

## 2022-05-30 NOTE — NC FL2 (Addendum)
[place] LEVEL OF CARE FORM     IDENTIFICATION  Patient Name: Natalie Brown Birthdate: 21-Jan-1966 Sex: female Admission Date (Current Location): 12/24/2021  [place] and [place] Number:  Kathleen Argue [id number] [place] and Address:  [facility], [place] [address] [place], [place] [address]      Provider Number: [id number]  Attending Physician Name and Address:  Elgergawy, Silver Huguenin, MD  Relative Name and Phone Number:       Current Level of Care: Hospital Recommended Level of Care: [facility] Prior Approval Number:    Date Approved/Denied:   PASRR Number:    Discharge Plan: Other (Comment) [facility])    Current Diagnoses: Patient Active Problem List   Diagnosis Date Noted   B12 deficiency 02/19/2022   COVID-19 virus infection 02/19/2022   Drooling 02/19/2022   Essential hypertension 02/19/2022   Altered mental state 12/26/2021   Sinus tachycardia 12/25/2021   Tobacco abuse 12/24/2021   Delirium due to multiple etiologies, acute, hyperactive 12/24/2021   Early onset Dementia with behavioral disturbance ([place]) 11/11/2021   Bipolar affective disorder, current episode manic with psychotic symptoms ([place]) 09/12/2021   Dental caries 06/29/2015   Cervical cancer screening 02/22/2014   Current smoker 02/22/2014   Urge incontinence of urine 12/30/2013   Need for prophylactic vaccination and inoculation against influenza 12/30/2013   Bipolar 1 disorder ([place]) 12/30/2013    Orientation RESPIRATION BLADDER Height & Weight     Self  Normal Incontinent - uses briefs Weight: 158 lb 11.7 oz (72 kg) Height:  5' 6"$  (167.6 cm)  BEHAVIORAL SYMPTOMS/MOOD NEUROLOGICAL BOWEL NUTRITION STATUS   (Dementia, no behaviors or aggression)   Incontinent - uses briefs Diet (Regular diet)  AMBULATORY STATUS COMMUNICATION OF NEEDS Skin   Supervision Verbally Normal                       Personal Care Assistance Level of Assistance   Bathing, Feeding, Dressing Bathing Assistance: Maximum assistance Feeding assistance: Maximum assistance Dressing Assistance: Maximum assistance     Functional Limitations Info  Sight, Hearing, Speech Sight Info: Adequate Hearing Info: Adequate Speech Info: Adequate    SPECIAL CARE FACTORS FREQUENCY                       Contractures Contractures Info: Not present    Additional Factors Info  Code Status, Psychotropic, Allergies Code Status Info: Full Code Allergies Info: Penicllins Psychotropic Info: Ativan         Current Medications (06/01/2022):  This is the current hospital active medication list Current Facility-Administered Medications  Medication Dose Route Frequency Provider Last Rate Last Admin   (feeding supplement) PROSource Plus liquid 30 mL  30 mL Oral BID BM Thurnell Lose, MD   30 mL at 05/31/22 1239   acetaminophen (TYLENOL) tablet 650 mg  650 mg Oral Q6H PRN Orma Flaming, MD   650 mg at 05/14/22 1003   alum & mag hydroxide-simeth (MAALOX/MYLANTA) 200-200-20 MG/5ML suspension 30 mL  30 mL Oral Q4H PRN Orma Flaming, MD       cyanocobalamin (VITAMIN B12) tablet 500 mcg  500 mcg Oral Daily Vance Gather B, MD   500 mcg at 05/31/22 1011   enoxaparin (LOVENOX) injection 40 mg  40 mg Subcutaneous Q24H Jonetta Osgood, MD   40 mg at 05/31/22 1239   feeding supplement (KATE FARMS STANDARD 1.4) liquid 325 mL  325 mL Oral  BID BM Orma Flaming, MD   325 mL at 05/31/22 1239   fluticasone (FLONASE) 50 MCG/ACT nasal spray 1 spray  1 spray Each Nare Daily PRN Orma Flaming, MD       haloperidol (HALDOL) tablet 1 mg  1 mg Oral BID Thurnell Lose, MD   1 mg at 05/31/22 2131   hydrocerin (EUCERIN) cream   Topical BID Orma Flaming, MD   Given at 06/01/22 0018   hydrOXYzine (ATARAX) tablet 25 mg  25 mg Oral TID PRN Orma Flaming, MD   25 mg at 06/01/22 0016   LORazepam (ATIVAN) tablet 0.5 mg  0.5 mg Oral Q6H PRN Orma Flaming, MD   0.5 mg at 05/20/22 1940    magnesium hydroxide (MILK OF MAGNESIA) suspension 30 mL  30 mL Oral Daily PRN Orma Flaming, MD       metoprolol tartrate (LOPRESSOR) injection 5 mg  5 mg Intravenous Q8H PRN Thurnell Lose, MD   5 mg at 01/02/22 1857   OLANZapine zydis (ZYPREXA) disintegrating tablet 5 mg  5 mg Oral Q8H PRN Orma Flaming, MD   5 mg at 05/04/22 0453   ondansetron (ZOFRAN) tablet 4 mg  4 mg Oral Q8H PRN Orma Flaming, MD   4 mg at 03/17/22 0139   Oral care mouth rinse  15 mL Mouth Rinse PRN Thurnell Lose, MD         Discharge Medications: Please see discharge summary for a list of discharge medications.  Relevant Imaging Results:  Relevant Lab Results:   Additional Information SSN: [place], [place]

## 2022-05-31 NOTE — Progress Notes (Signed)
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 Transferred to [facility] hospital on 12/24/21 for AMS with possible catatonia. Psychiatry and Neurology evaluated patient during her stay.  Patient is medically stable for discharge and is awaiting placement at a memory care facility.     Major events: 09/07/2021 admitted to behavioral health.  Treated with Zyprexa Invega and Ativan challenge for catatonia. 12/24/2021 sent to ER from [facility] for evaluation for tachycardia 9/04-9/5, 9/5>9/9, LTM EEG  no seizure. TSH stable CTA chest no PE no pneumonia RPR nonreactive bilateral LE Doppler negative for DVT Echo with normal EF  9/23 febrile secondary to Covid 19 + no pneumonia. 9/25 repeat CT brain unremarkable. 05/14/2022 Febrile. Noted to be +RSV.    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  Patient is alert, awake but does not communicate effectively Ambulates freely during the day.  Able to feed by herself.  Continue close supervision as she has unintentionally eloped.   Recent RSV Noted to be RSV positive on 1/23.  She was febrile at the time.  Currently asymptomatic  Bipolar disorder Currently mood looks stable.  Continue  Haldol, zyprexa ,hydroxyzine and Ativan as needed. Does not communicate  Hypertension Currently off medications.  Currently BP stable  CKD stage IIIb Currently kidney function is stable.  Monitor intermittently  Vitamin B12 deficiency Continue oral supplementation  Sinus tachycardia Currently stable.  Propranolol discontinued due to hypotension/bradycardia.  Monitor  Disposition/debility/deconditioning Medically stable for discharge.  Pending placement.  TOC following.      DVT prophylaxis:enoxaparin (LOVENOX) injection 40 mg Start: 12/26/21 1200     Code Status: Full Code  Family Communication: None at bedside  Patient status:Inpatient  Patient is from : Outside behavioral Hospital  Anticipated discharge to: Long-term memory care  Estimated DC date: Not sure   Consultants:  Psychiatry Neurology  Procedures: None  Antimicrobials:  Anti-infectives (From admission, onward)    None       Subjective:  No significant events overnight as discussed with staff, she is unable to provide any complaints   Objective: Vitals:   05/30/22 1947 05/31/22 0500 05/31/22 0831 05/31/22 1243  BP: 107/81 109/84 111/72 130/73  Pulse: 82 (!) 101 98   Resp: 18 18 19 18  $ Temp: 97.7 F (36.5 C) 98.7 F (37.1 C) 98.7 F (37.1 C) 98.7 F (37.1 C)  TempSrc: Axillary Oral Oral Oral  SpO2: 100% 100% 100% 97%  Weight:      Height:        Intake/Output Summary (Last 24 hours) at 05/31/2022 1325 Last data filed at 05/31/2022 0700 Gross per 24 hour  Intake 60 ml  Output --  Net 60 ml    Filed Weights   02/03/22 2025  Weight: 72 kg    Examination:  Patient is sitting comfortably in the bed, no apparent distress, good air entry bilaterally, legs with no edema .  Data Reviewed: I have personally reviewed following labs and imaging studies  CBC: Recent Labs  Lab 05/27/22 1340  WBC 7.7  NEUTROABS 4.3  HGB 13.7  HCT 40.3  MCV 86.9  PLT 123XX123*   Basic Metabolic  Panel: Recent Labs  Lab 05/27/22 1340  NA 142  K 4.7  CL 106  CO2 27  GLUCOSE 114*  BUN 22*  CREATININE 1.20*  CALCIUM 9.1  MG 2.2     No results found for this or any previous visit (from the past 240 hour(s)).    Radiology Studies: No results found.  Scheduled Meds:  (feeding supplement) PROSource Plus  30 mL Oral BID BM   vitamin B-12  500 mcg Oral Daily   enoxaparin (LOVENOX) injection  40 mg Subcutaneous Q24H   feeding supplement (KATE FARMS STANDARD 1.4)  325 mL Oral BID BM   haloperidol  1 mg Oral BID   hydrocerin   Topical BID   Continuous Infusions:   LOS: 156 days   Phillips Climes, MD [facility] P2/12/2022, 1:25 PM

## 2022-05-31 NOTE — Progress Notes (Signed)
CSW spoke with Margaretann Loveless of [facility] who states she will be by today at 1pm to visit the patient.  CSW informed RN of information.  Madilyn Fireman, MSW, LCSW Transitions of Care  Clinical Social Worker II [phone]

## 2022-06-01 NOTE — Progress Notes (Signed)
CSW spoke with Natalie Brown of [name] to discuss the interview that was done with the patient yesterday. CSW answered questions and made adjustments the the FL2.   CSW spoke with patient's mother Natalie Brown to inform her of information. Natalie Brown agreeable to speak with Dedra regarding placement - contact information was provided.  Madilyn Fireman, MSW, LCSW Transitions of Care  Clinical Social Worker II [phone]

## 2022-06-01 NOTE — Progress Notes (Signed)
PROGRESS NOTE  Natalie Brown  [id number] DOB: [birth year] DOA: 12/24/2021 PCP: Marliss Coots, NP    Brief Narrative:  Patient  is a 57 yo female with a PMH of Bipolar Affective Disorder and Chronic Dementia with Advanced Cerebral Atrophy and Behavioral Disturbances who was admitted on 09/07/21 with acute mania and aggressive behavior to [facility]. Transferred to [facility] hospital on 12/24/21 for AMS with possible catatonia. Psychiatry and Neurology evaluated patient during her stay.  Patient is medically stable for discharge and is awaiting placement at a memory care facility.     Major events: 09/07/2021 admitted to behavioral health.  Treated with Zyprexa Invega and Ativan challenge for catatonia. 12/24/2021 sent to ER from [facility] for evaluation for tachycardia 9/04-9/5, 9/5>9/9, LTM EEG  no seizure. TSH stable CTA chest no PE no pneumonia RPR nonreactive bilateral LE Doppler negative for DVT Echo with normal EF  9/23 febrile secondary to Covid 19 + no pneumonia. 9/25 repeat CT brain unremarkable. 05/14/2022 Febrile. Noted to be +RSV.    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  Patient is alert, awake but does not communicate effectively Ambulates freely during the day.  Able to feed by herself.  Continue close supervision as she has unintentionally eloped.   Recent RSV Noted to be RSV positive on 1/23.  She was febrile at the time.  Currently asymptomatic  Bipolar disorder Currently mood looks stable.  Continue  Haldol, zyprexa ,hydroxyzine and Ativan as needed. Does not communicate  Hypertension Currently off medications.  Currently BP stable  CKD stage IIIb Currently kidney function is stable.  Monitor intermittently  Vitamin B12 deficiency Continue oral supplementation  Sinus tachycardia Currently stable.  Propranolol discontinued due to hypotension/bradycardia.  Monitor  Disposition/debility/deconditioning Medically stable for discharge.  Pending placement.  TOC following.      DVT prophylaxis:enoxaparin (LOVENOX) injection 40 mg Start: 12/26/21 1200     Code Status: Full Code  Family Communication: No family at bedside  Patient status:Inpatient  Patient is from : Outside behavioral Hospital  Anticipated discharge to: Long-term memory care  Estimated DC date: Not sure   Consultants:  Psychiatry Neurology  Procedures: None  Antimicrobials:  Anti-infectives (From admission, onward)    None       Subjective:  No significant events overnight as discussed with staff, she is unable to provide any complaints   Objective: Vitals:   05/31/22 1243 05/31/22 1639 05/31/22 2021 06/01/22 0832  BP: 130/73 (!) 122/95  125/80  Pulse:   99 98  Resp: 18 19 17 20  $ Temp: 98.7 F (37.1 C) 98.8 F (37.1 C) 98.9 F (37.2 C) 97.7 F (36.5 C)  TempSrc: Oral Oral Oral Oral  SpO2: 97% 100% 98%   Weight:      Height:        Intake/Output Summary (Last 24 hours) at 06/01/2022 1132 Last data filed at 05/31/2022 1700 Gross per 24 hour  Intake 480 ml  Output --  Net 480 ml    Filed Weights   02/03/22 2025  Weight: 72 kg    Examination:  Patient sitting comfortably at the edge of the bed, eating breakfast with assistance of her observation sitter, good air entry, no edema.  Data Reviewed: I have personally reviewed following labs and imaging studies  CBC: Recent Labs  Lab 05/27/22 1340  WBC 7.7  NEUTROABS 4.3  HGB 13.7  HCT 40.3  MCV 86.9  PLT 413*   Basic  Metabolic Panel: Recent Labs  Lab 05/27/22 1340  NA 142  K 4.7  CL 106  CO2 27  GLUCOSE 114*  BUN 22*  CREATININE 1.20*  CALCIUM 9.1  MG 2.2     No results found for this or any previous visit (from the past 240 hour(s)).    Radiology Studies: No results found.  Scheduled Meds:  (feeding supplement) PROSource Plus  30 mL Oral BID BM   vitamin B-12  500 mcg Oral Daily   enoxaparin (LOVENOX) injection  40 mg Subcutaneous Q24H   feeding supplement (KATE FARMS STANDARD 1.4)  325 mL Oral BID BM   haloperidol  1 mg Oral BID   hydrocerin   Topical BID   Continuous Infusions:   LOS: 157 days   Phillips Climes, MD [facility] P2/01/2023, 11:32 AM

## 2022-06-02 NOTE — Progress Notes (Signed)
CSW spoke with Dedra of [facility] who states she completed a second visit with patient this morning. Dedra was able to see patient ambulate safely. Margaretann Loveless will have further discussion with patient's mother regarding finances. CSW provided Dedra with a copy of the TB test results from October 2023.  Madilyn Fireman, MSW, LCSW Transitions of Care  Clinical Social Worker II [phone]

## 2022-06-02 NOTE — Progress Notes (Signed)
PROGRESS NOTE  Natalie Brown  [id number] DOB: 1966/02/16 DOA: 12/24/2021 PCP: Marliss Coots, NP    Brief Narrative:  Patient  is a 57 yo female with a PMH of Bipolar Affective Disorder and Chronic Dementia with Advanced Cerebral Atrophy and Behavioral Disturbances who was admitted on 09/07/21 with acute mania and aggressive behavior to [facility]. Transferred to [facility] hospital on 12/24/21 for AMS with possible catatonia. Psychiatry and Neurology evaluated patient during her stay.  Patient is medically stable for discharge and is awaiting placement at a memory care facility.     Major events: 09/07/2021 admitted to behavioral health.  Treated with Zyprexa Invega and Ativan challenge for catatonia. 12/24/2021 sent to ER from [facility] for evaluation for tachycardia 9/04-9/5, 9/5>9/9, LTM EEG  no seizure. TSH stable CTA chest no PE no pneumonia RPR nonreactive bilateral LE Doppler negative for DVT Echo with normal EF  9/23 febrile secondary to Covid 19 + no pneumonia. 9/25 repeat CT brain unremarkable. 05/14/2022 Febrile. Noted to be +RSV.    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  Patient is alert, awake but does not communicate effectively Ambulates freely during the day.  Able to feed by herself.  Continue close supervision as she has unintentionally eloped.   Recent RSV Noted to be RSV positive on 1/23.  She was febrile at the time.  Currently asymptomatic  Bipolar disorder Currently mood looks stable.  Continue  Haldol, zyprexa ,hydroxyzine and Ativan as needed. Does not communicate  Hypertension Currently off medications.  Currently BP stable  CKD stage IIIb Currently kidney function is stable.  Monitor intermittently  Vitamin B12 deficiency Continue oral supplementation  Sinus tachycardia Currently stable.  Propranolol discontinued due to hypotension/bradycardia.  Monitor  Disposition/debility/deconditioning Medically stable for discharge.  Pending placement.  TOC following.      DVT prophylaxis:enoxaparin (LOVENOX) injection 40 mg Start: 12/26/21 1200     Code Status: Full Code  Family Communication: No family at bedside  Patient status:Inpatient  Patient is from : Outside behavioral Hospital  Anticipated discharge to: Long-term memory care  Estimated DC date: Not sure   Consultants:  Psychiatry Neurology  Procedures: None  Antimicrobials:  Anti-infectives (From admission, onward)    None       Subjective:  No significant events overnight as discussed with staff, she is unable to provide any complaints   Objective: Vitals:   05/31/22 2021 06/01/22 0832 06/01/22 1951 06/02/22 0707  BP:  125/80 132/75 136/84  Pulse: 99 98 (!) 110 (!) 106  Resp: 17 20  17  $ Temp: 98.9 F (37.2 C) 97.7 F (36.5 C)    TempSrc: Oral Oral    SpO2: 98%  100% 96%  Weight:      Height:        Intake/Output Summary (Last 24 hours) at 06/02/2022 1327 Last data filed at 06/01/2022 1846 Gross per 24 hour  Intake 120 ml  Output --  Net 120 ml    Filed Weights   02/03/22 2025  Weight: 72 kg  Examination:  Patient laying in bed comfortably, no apparent distress  Abdomen soft, lower extremity with no edema  Data Reviewed: I have personally reviewed following labs and imaging studies  CBC: Recent Labs  Lab 05/27/22 1340  WBC 7.7  NEUTROABS 4.3  HGB 13.7  HCT 40.3  MCV 86.9  PLT 123XX123*   Basic Metabolic Panel: Recent Labs  Lab 05/27/22 1340  NA 142  K 4.7   CL 106  CO2 27  GLUCOSE 114*  BUN 22*  CREATININE 1.20*  CALCIUM 9.1  MG 2.2     No results found for this or any previous visit (from the past 240 hour(s)).    Radiology Studies: No results found.  Scheduled Meds:  (feeding supplement) PROSource Plus  30 mL Oral BID BM   vitamin B-12  500 mcg Oral Daily   enoxaparin (LOVENOX) injection  40 mg Subcutaneous Q24H   feeding supplement (KATE FARMS STANDARD 1.4)  325 mL Oral BID BM   haloperidol  1 mg Oral BID   hydrocerin   Topical BID   Continuous Infusions:   LOS: 158 days   Phillips Climes, MD [facility] P2/02/2023, 1:27 PM

## 2022-06-03 LAB — BASIC METABOLIC PANEL
Anion gap: 8 (ref 5–15)
BUN: 22 mg/dL — ABNORMAL HIGH (ref 6–20)
CO2: 24 mmol/L (ref 22–32)
Calcium: 8.9 mg/dL (ref 8.9–10.3)
Chloride: 107 mmol/L (ref 98–111)
Creatinine, Ser: 1.14 mg/dL — ABNORMAL HIGH (ref 0.44–1.00)
GFR, Estimated: 57 mL/min — ABNORMAL LOW (ref >60)
Glucose, Bld: 113 mg/dL — ABNORMAL HIGH (ref 70–99)
Potassium: 3.4 mmol/L — ABNORMAL LOW (ref 3.5–5.1)
Sodium: 139 mmol/L (ref 135–145)

## 2022-06-03 LAB — CBC WITH DIFFERENTIAL/PLATELET
Abs Immature Granulocytes: 0.01 10*3/uL (ref 0.00–0.07)
Basophils Absolute: 0 10*3/uL (ref 0.0–0.1)
Basophils Relative: 1 %
Eosinophils Absolute: 0.2 10*3/uL (ref 0.0–0.5)
Eosinophils Relative: 4 %
HCT: 39.1 % (ref 36.0–46.0)
Hemoglobin: 13 g/dL (ref 12.0–15.0)
Immature Granulocytes: 0 %
Lymphocytes Relative: 37 %
Lymphs Abs: 1.8 10*3/uL (ref 0.7–4.0)
MCH: 28.8 pg (ref 26.0–34.0)
MCHC: 33.2 g/dL (ref 30.0–36.0)
MCV: 86.7 fL (ref 80.0–100.0)
Monocytes Absolute: 0.3 10*3/uL (ref 0.1–1.0)
Monocytes Relative: 7 %
Neutro Abs: 2.5 10*3/uL (ref 1.7–7.7)
Neutrophils Relative %: 51 %
Platelets: 292 10*3/uL (ref 150–400)
RBC: 4.51 MIL/uL (ref 3.87–5.11)
RDW: 13.5 % (ref 11.5–15.5)
WBC: 4.9 10*3/uL (ref 4.0–10.5)
nRBC: 0 % (ref 0.0–0.2)

## 2022-06-03 LAB — MAGNESIUM: Magnesium: 1.9 mg/dL (ref 1.7–2.4)

## 2022-06-03 NOTE — Progress Notes (Signed)
Mobility Specialist - Progress Note   06/03/22 0917  Mobility  Activity Ambulated with assistance in hallway  Level of Assistance Standby assist, set-up cues, supervision of patient - no hands on  Assistive Device None  Distance Ambulated (ft) 40 ft  Activity Response Tolerated well  Mobility Referral Yes  $Mobility charge 1 Mobility   Pt was received wandering hall and needing guidance back to room. Pt was returned to bed with all needs met. RN aware.   Franki Monte  Mobility Specialist Please contact via Solicitor or Rehab office at [phone]

## 2022-06-03 NOTE — Progress Notes (Signed)
PROGRESS NOTE  Natalie Brown  [id number] DOB: 01-Dec-1965 DOA: 12/24/2021 PCP: Marliss Coots, NP    Brief Narrative:  Patient  is a 57 yo female with a PMH of Bipolar Affective Disorder and Chronic Dementia with Advanced Cerebral Atrophy and Behavioral Disturbances who was admitted on 09/07/21 with acute mania and aggressive behavior to [facility]. Transferred to [facility] hospital on 12/24/21 for AMS with possible catatonia. Psychiatry and Neurology evaluated patient during her stay.  Patient is medically stable for discharge and is awaiting placement at a memory care facility.     Major events: 09/07/2021 admitted to behavioral health.  Treated with Zyprexa Invega and Ativan challenge for catatonia. 12/24/2021 sent to ER from [facility] for evaluation for tachycardia 9/04-9/5, 9/5>9/9, LTM EEG  no seizure. TSH stable CTA chest no PE no pneumonia RPR nonreactive bilateral LE Doppler negative for DVT Echo with normal EF  9/23 febrile secondary to Covid 19 + no pneumonia. 9/25 repeat CT brain unremarkable. 05/14/2022 Febrile. Noted to be +RSV.    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  Patient is alert, awake but does not communicate effectively Ambulates freely during the day.  Able to feed by herself.  Continue close supervision as she has unintentionally eloped.   Recent RSV Noted to be RSV positive on 1/23.  She was febrile at the time.  Currently asymptomatic  Bipolar disorder Currently mood looks stable.  Continue  Haldol, zyprexa ,hydroxyzine and Ativan as needed. Does not communicate  Hypertension Currently off medications.  Currently BP stable  CKD stage IIIb Currently kidney function is stable.  Monitor intermittently  Vitamin B12 deficiency Continue oral supplementation  Sinus tachycardia Currently stable.  Propranolol discontinued due to hypotension/bradycardia.  Monitor  Disposition/debility/deconditioning Medically stable for discharge.  Pending placement.  TOC following.      DVT prophylaxis:enoxaparin (LOVENOX) injection 40 mg Start: 12/26/21 1200     Code Status: Full Code  Family Communication: No family at bedside  Patient status:Inpatient  Patient is from : Outside behavioral Hospital  Anticipated discharge to: Long-term memory care  Estimated DC date: Not sure   Consultants:  Psychiatry Neurology  Procedures: None  Antimicrobials:  Anti-infectives (From admission, onward)    None       Subjective:  No significant events overnight, she is unable to provide any complaints today.    Objective: Vitals:   06/02/22 0707 06/02/22 1900 06/03/22 0400 06/03/22 0928  BP: 136/84 99/86 102/67 132/82  Pulse: (!) 106   88  Resp: 17 16 16 16  $ Temp:  97.6 F (36.4 C) 98.4 F (36.9 C) 98.4 F (36.9 C)  TempSrc:  Oral Oral Oral  SpO2: 96% 100% 100% 100%  Weight:      Height:       No intake or output data in the 24 hours ending 06/03/22 1255   Filed Weights   02/03/22 2025  Weight: 72 kg    Examination:  Patient sitting in bed in no apparent distress, good air entry bilaterally, no leg edema.  Data Reviewed: I have personally reviewed following labs and imaging studies  CBC: Recent Labs  Lab 05/27/22 1340  WBC 7.7  NEUTROABS 4.3  HGB 13.7  HCT 40.3  MCV 86.9  PLT 123XX123*   Basic Metabolic Panel: Recent Labs  Lab 05/27/22 1340  NA 142  K 4.7  CL 106  CO2 27  GLUCOSE 114*  BUN 22*  CREATININE 1.20*  CALCIUM 9.1  MG 2.2     No  results found for this or any previous visit (from the past 240 hour(s)).    Radiology Studies: No results found.  Scheduled Meds:  (feeding supplement) PROSource Plus  30 mL Oral BID BM   vitamin B-12  500 mcg Oral Daily   enoxaparin (LOVENOX) injection  40 mg Subcutaneous Q24H   feeding supplement (KATE FARMS STANDARD 1.4)  325 mL Oral BID BM   haloperidol  1 mg Oral BID   hydrocerin   Topical BID   Continuous Infusions:   LOS: 159 days   Phillips Climes, MD [facility] P2/03/2023, 12:55 PM

## 2022-06-03 NOTE — Progress Notes (Signed)
Mobility Specialist - Progress Note   06/03/22 1508  Mobility  Activity Ambulated with assistance in hallway  Level of Assistance Contact guard assist, steadying assist  Distance Ambulated (ft) 100 ft  Activity Response Tolerated well  Mobility Referral Yes  $Mobility charge 1 Mobility   Pt received in hallway and needing assistance back to room. Pt was returned to bed with all needs met and bed alarm.  Franki Monte  Mobility Specialist Please contact via Solicitor or Rehab office at [phone]

## 2022-06-04 NOTE — Progress Notes (Addendum)
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 Transferred to [facility] hospital on 12/24/21 for AMS with possible catatonia. Psychiatry and Neurology evaluated patient during her stay.  Patient is medically stable for discharge and is awaiting placement at a memory care facility.     Major events: 09/07/2021 admitted to behavioral health.  Treated with Zyprexa Invega and Ativan challenge for catatonia. 12/24/2021 sent to ER from [facility] for evaluation for tachycardia 9/04-9/5, 9/5>9/9, LTM EEG  no seizure. TSH stable CTA chest no PE no pneumonia RPR nonreactive bilateral LE Doppler negative for DVT Echo with normal EF  9/23 febrile secondary to Covid 19 + no pneumonia. 9/25 repeat CT brain unremarkable. 05/14/2022 Febrile. Noted to be +RSV.    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  Patient is alert, awake but does not communicate effectively Ambulates freely during the day.  Able to feed by herself.  Continue close supervision as she has unintentionally eloped.   Recent RSV Noted to be RSV positive on 1/23.  She was febrile at the time.  Currently asymptomatic  Bipolar disorder Currently mood looks stable.  Continue  Haldol, zyprexa ,hydroxyzine and Ativan as needed. Does not communicate  Hypertension Currently off medications.  Currently BP stable  CKD stage IIIb Currently kidney function is stable.  Monitor intermittently  Vitamin B12 deficiency Continue oral supplementation  Sinus tachycardia Currently stable.  Propranolol discontinued due to hypotension/bradycardia.  Monitor  Disposition/debility/deconditioning Medically stable for discharge.  Pending placement.  TOC following.      DVT prophylaxis:enoxaparin (LOVENOX) injection 40 mg Start: 12/26/21 1200     Code Status: Full Code  Family Communication: No family at bedside  Patient status:Inpatient  Patient is from : Outside behavioral Hospital  Anticipated discharge to: Long-term memory care  Estimated DC date: Not sure   Consultants:  Psychiatry Neurology  Procedures: None  Antimicrobials:  Anti-infectives (From admission, onward)    None       Subjective:  No significant events overnight, she is unable to provide any complaints today.  Objective: Vitals:   06/03/22 0928 06/03/22 2011 06/04/22 0620 06/04/22 0751  BP: 132/82 100/67 130/63 (!) 156/104  Pulse: 88 65 98 63  Resp: 16 16 18 18  $ Temp: 98.4 F (36.9 C) 98.2 F (36.8 C) 97.7 F (36.5 C) 98.3 F (36.8 C)  TempSrc: Oral Oral Oral Oral  SpO2: 100% 99% 100% 95%  Weight:      Height:        Intake/Output Summary (Last 24 hours) at 06/04/2022 1138 Last data filed at 06/03/2022 2245 Gross per 24 hour  Intake 120 ml  Output --  Net 120 ml     Filed Weights   02/03/22 2025  Weight: 72  kg    Examination:  Patient sitting at the edge of the bed, eating breakfast, good air entry bilaterally, legs with no edema .  Data Reviewed: I have personally reviewed following labs and imaging studies  CBC: Recent Labs  Lab 06/03/22 1718  WBC 4.9  NEUTROABS 2.5  HGB 13.0  HCT 39.1  MCV 86.7  PLT 123456   Basic Metabolic Panel: Recent Labs   Lab 06/03/22 1718  NA 139  K 3.4*  CL 107  CO2 24  GLUCOSE 113*  BUN 22*  CREATININE 1.14*  CALCIUM 8.9  MG 1.9     No results found for this or any previous visit (from the past 240 hour(s)).    Radiology Studies: No results found.  Scheduled Meds:  (feeding supplement) PROSource Plus  30 mL Oral BID BM   vitamin B-12  500 mcg Oral Daily   enoxaparin (LOVENOX) injection  40 mg Subcutaneous Q24H   feeding supplement (KATE FARMS STANDARD 1.4)  325 mL Oral BID BM   haloperidol  1 mg Oral BID   hydrocerin   Topical BID   Continuous Infusions:   LOS: 160 days   Phillips Climes, MD [facility] P2/13/2024, 11:38 AM

## 2022-06-05 MED ORDER — POTASSIUM CHLORIDE 20 MEQ PO PACK
40.0000 meq | PACK | Freq: Once | ORAL | Status: AC
  Administered 2022-06-05: 40 meq via ORAL
  Filled 2022-06-05: qty 2

## 2022-06-05 NOTE — Progress Notes (Signed)
2328 Return call to J. Futrell with updates.  Questions and concerns regarding eating dinner and hygiene addressed.  No additional questions or concerns verbalized by Mr. Futrell at this time.

## 2022-06-05 NOTE — Progress Notes (Signed)
CSW spoke with Dedra at [facility] who states patient can be move into the facility on 06/17/22.   CSW completed PCS application and SSA payee form - forms left on unit for MD signature.  Madilyn Fireman, MSW, LCSW Transitions of Care  Clinical Social Worker II [phone]

## 2022-06-05 NOTE — Progress Notes (Signed)
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 Transferred to [facility] hospital on 12/24/21 for AMS with possible catatonia. Psychiatry and Neurology evaluated patient during her stay.  Patient is medically stable for discharge and is awaiting placement at a memory care facility.     Major events: 09/07/2021 admitted to behavioral health.  Treated with Zyprexa Invega and Ativan challenge for catatonia. 12/24/2021 sent to ER from [facility] for evaluation for tachycardia 9/04-9/5, 9/5>9/9, LTM EEG  no seizure. TSH stable CTA chest no PE no pneumonia RPR nonreactive bilateral LE Doppler negative for DVT Echo with normal EF  9/23 febrile secondary to Covid 19 + no pneumonia. 9/25 repeat CT brain unremarkable. 05/14/2022 Febrile. Noted to be +RSV.    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  Patient is alert, awake but does not communicate effectively Ambulates freely during the day.    Continue close supervision as she has unintentionally eloped.  -She was evaluated by Psych and neurology during this hospital stay.  -MRI negative, EEG negative,  -ANA negative in the past 09/2021. Heavy metal panel negative 09/2021   Hypokalemia;  replete orally.   Recent RSV Noted to be RSV positive on 1/23.  She was febrile at the time.  Currently asymptomatic  Bipolar disorder Currently mood looks stable.  Continue Haldol, zyprexa ,hydroxyzine and Ativan as needed. Does not communicate  Hypertension Currently off medications.  Currently BP stable  CKD stage IIIb Currently kidney function is stable.  Monitor intermittently  Vitamin B12 deficiency Continue oral supplementation  Sinus tachycardia Currently stable.  Propranolol discontinued due to hypotension/bradycardia.  Monitor Resolved.   Disposition/debility/deconditioning Medically stable for discharge.  Pending placement.  TOC following.      DVT prophylaxis:enoxaparin (LOVENOX) injection 40 mg Start: 12/26/21 1200     Code Status: Full Code  Family Communication: No family at bedside  Patient status:Inpatient  Patient is from : Outside behavioral Hospital  Anticipated discharge to: Long-term memory care  Estimated DC date: Not sure   Consultants:  Psychiatry Neurology  Procedures: None  Antimicrobials:  Anti-infectives (From admission, onward)    None       Subjective: [name] lying down in bed, moving and changing position constantly.  Objective: Vitals:   06/03/22 2011 06/04/22 0620 06/04/22 0751 06/04/22 1900  BP: 100/67 130/63 (!) 156/104 94/67  Pulse: 65 98 63 (!) 57  Resp: 16 18 18 16  $ Temp: 98.2 F (36.8 C) 97.7 F (36.5 C) 98.3 F (36.8 C) (!) 97.2 F (36.2 C)  TempSrc: Oral Oral Oral Axillary  SpO2: 99% 100% 95%   Weight:      Height:        Intake/Output Summary (Last 24 hours) at 06/05/2022 1211 Last data filed  at 06/05/2022 0813 Gross per 24 hour  Intake 240 ml  Output --  Net 240 ml      Filed Weights   02/03/22 2025  Weight: 72 kg    Examination: General; No acute distress. No responding to questions.  CVS; S1, S 2 RRR Lung; BL air movement    Data Reviewed: I have personally reviewed following  labs and imaging studies  CBC: Recent Labs  Lab 06/03/22 1718  WBC 4.9  NEUTROABS 2.5  HGB 13.0  HCT 39.1  MCV 86.7  PLT 123456    Basic Metabolic Panel: Recent Labs  Lab 06/03/22 1718  NA 139  K 3.4*  CL 107  CO2 24  GLUCOSE 113*  BUN 22*  CREATININE 1.14*  CALCIUM 8.9  MG 1.9      No results found for this or any previous visit (from the past 240 hour(s)).    Radiology Studies: No results found.  Scheduled Meds:  (feeding supplement) PROSource Plus  30 mL Oral BID BM   vitamin B-12  500 mcg Oral Daily   enoxaparin (LOVENOX) injection  40 mg Subcutaneous Q24H   feeding supplement (KATE FARMS STANDARD 1.4)  325 mL Oral BID BM   haloperidol  1 mg Oral BID   hydrocerin   Topical BID   potassium chloride  40 mEq Oral Once   Continuous Infusions:   LOS: 161 days   Elmarie Shiley, MD [facility] P2/14/2024, 12:11 PM

## 2022-06-06 LAB — BASIC METABOLIC PANEL
Anion gap: 8 (ref 5–15)
BUN: 18 mg/dL (ref 6–20)
CO2: 25 mmol/L (ref 22–32)
Calcium: 9.3 mg/dL (ref 8.9–10.3)
Chloride: 106 mmol/L (ref 98–111)
Creatinine, Ser: 1.12 mg/dL — ABNORMAL HIGH (ref 0.44–1.00)
GFR, Estimated: 58 mL/min — ABNORMAL LOW (ref >60)
Glucose, Bld: 82 mg/dL (ref 70–99)
Potassium: 4.3 mmol/L (ref 3.5–5.1)
Sodium: 139 mmol/L (ref 135–145)

## 2022-06-06 NOTE — Progress Notes (Signed)
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 Transferred to [facility] hospital on 12/24/21 for AMS with possible catatonia. Psychiatry and Neurology evaluated patient during her stay.  Patient is medically stable for discharge and is awaiting placement at a memory care facility.     Major events: 09/07/2021 admitted to behavioral health.  Treated with Zyprexa Invega and Ativan challenge for catatonia. 12/24/2021 sent to ER from [facility] for evaluation for tachycardia 9/04-9/5, 9/5>9/9, LTM EEG  no seizure. TSH stable CTA chest no PE no pneumonia RPR nonreactive bilateral LE Doppler negative for DVT Echo with normal EF  9/23 febrile secondary to Covid 19 + no pneumonia. 9/25 repeat CT brain unremarkable. 05/14/2022 Febrile. Noted to be +RSV.    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  Patient is alert, awake but does not communicate effectively Ambulates freely during the day.    Continue close supervision as she has unintentionally eloped.  -She was evaluated by Psych and neurology during this hospital stay.  -MRI negative, EEG negative,  -ANA negative in the past 09/2021. Heavy metal panel negative 09/2021   Bipolar  disorder Currently mood looks stable.  Continue Haldol, zyprexa ,hydroxyzine and Ativan as needed. Does not communicate  Hypertension Currently off medications.  Currently BP stable  CKD stage IIIb Currently kidney function is stable.  Monitor intermittently  Vitamin B12 deficiency Continue oral supplementation  Disposition/debility/deconditioning Medically stable for discharge.  Pending placement.  TOC following.      DVT prophylaxis:enoxaparin (LOVENOX) injection 40 mg Start: 12/26/21 1200     Code Status: Full Code  Family Communication: No family at bedside  Patient status:Inpatient  Patient is from : Outside behavioral Hospital  Anticipated discharge to: Long-term memory care  Estimated DC date: Not sure.  Anticipate next week.   Consultants:  Psychiatry Neurology  Procedures: None  Antimicrobials:  Anti-infectives (From admission, onward)    None       Subjective: Patient seen lying in the bed.  Eyes closed.  Nursing reported no new events.  Objective: Vitals:   06/04/22 0751 06/04/22 1900 06/05/22 2217 06/06/22 0806  BP: (!) 156/104 94/67 109/70 131/84  Pulse: 63 (!) 57 98 (!) 111  Resp: 18 16 17 17  $ Temp: 98.3 F (36.8 C) (!) 97.2 F (36.2 C) 98.6 F (37 C)   TempSrc: Oral Axillary    SpO2: 95%  100% 100%  Weight:      Height:       No intake or output data in the 24 hours ending 06/06/22 1040    Filed Weights   02/03/22 2025  Weight: 72 kg    Examination: Looks comfortable.  Anxious on stimulation and talking.    Data Reviewed: I have personally reviewed  following labs and imaging studies  CBC: Recent Labs  Lab 06/03/22 1718  WBC 4.9  NEUTROABS 2.5  HGB 13.0  HCT 39.1  MCV 86.7  PLT 123456    Basic Metabolic Panel: Recent Labs  Lab 06/03/22 1718  NA 139  K 3.4*  CL 107  CO2 24  GLUCOSE 113*  BUN 22*  CREATININE 1.14*  CALCIUM 8.9  MG 1.9      No results found for this or any previous visit (from the  past 240 hour(s)).    Radiology Studies: No results found.  Scheduled Meds:  (feeding supplement) PROSource Plus  30 mL Oral BID BM   vitamin B-12  500 mcg Oral Daily   enoxaparin (LOVENOX) injection  40 mg Subcutaneous Q24H   feeding supplement (KATE FARMS STANDARD 1.4)  325 mL Oral BID BM   haloperidol  1 mg Oral BID   hydrocerin   Topical BID   Continuous Infusions:   LOS: 162 days   Total time spent: 25 minutes  Barb Merino, MD [facility] P2/15/2024, 10:40 AM

## 2022-06-06 NOTE — Progress Notes (Signed)
Mobility Specialist - Progress Note   06/06/22 1046  Mobility  Activity Ambulated with assistance in room  Level of Assistance Contact guard assist, steadying assist  Assistive Device Other (Comment) (HHA)  Distance Ambulated (ft) 70 ft  Activity Response Tolerated well  $Mobility charge 1 Mobility   Pt was received climbing over bed to retreive item. Pt was HHA throughout  session and get back to bed. Pt was returned to bed with all needs met and bed alarm is on.   Franki Monte  Mobility Specialist Please contact via Solicitor or Rehab office at [phone]

## 2022-06-06 NOTE — Plan of Care (Signed)
  Problem: Nutrition: Goal: Adequate nutrition will be maintained Outcome: Progressing   Problem: Safety: Goal: Ability to remain free from injury will improve Outcome: Progressing   Problem: Skin Integrity: Goal: Risk for impaired skin integrity will decrease Outcome: Progressing   

## 2022-06-07 MED ORDER — BRIEFS OVERNIGHT LARGE MISC: 1.0000 | 11 refills | Status: DC

## 2022-06-07 NOTE — Progress Notes (Signed)
Mobility Specialist - Progress Note   06/07/22 1550  Mobility  Activity Ambulated with assistance in hallway;Ambulated with assistance in room  Level of Assistance Contact guard assist, steadying assist  Assistive Device Other (Comment) (HHA)  Distance Ambulated (ft) 70 ft  Activity Response Tolerated well  Mobility Referral No  $Mobility charge 1 Mobility   Pt was received in hall pushing a trash can. Pt was HHA to direct to room. Pt was returned to bed with all needs met and NT present.  Franki Monte  Mobility Specialist Please contact via Solicitor or Rehab office at [phone]

## 2022-06-07 NOTE — Progress Notes (Signed)
CSW sent completed SSA form and PCS application to Dedra at [facility].  CSW spoke with patient's mother Gregary Signs to inform her of updates. Gregary Signs states she will ensure patient has briefs at the facility. CSW informed Gregary Signs that patient is currently in need of briefs - Gregary Signs will obtain briefs and get them to the hospital.  Madilyn Fireman, MSW, LCSW Transitions of Care  Clinical Social Worker II [phone]

## 2022-06-07 NOTE — Progress Notes (Signed)
PROGRESS NOTE  Natalie Brown  [id number] DOB: [birth year] DOA: 12/24/2021 PCP: Marliss Coots, NP    Brief Narrative: Patient  is a 57 yo female with a PMH of Bipolar Affective Disorder and Chronic Dementia with Advanced Cerebral Atrophy and Behavioral Disturbances who was admitted on 09/07/21 with acute mania and aggressive behavior to [facility]. Transferred to [facility] hospital on 12/24/21 for AMS with possible catatonia. Psychiatry and Neurology evaluated patient during her stay.  Patient is medically stable for discharge and is awaiting placement at a memory care facility.     Major events: 09/07/2021 admitted to behavioral health.  Treated with Zyprexa Invega and Ativan challenge for catatonia. 12/24/2021 sent to ER from [facility] for evaluation for tachycardia 9/04-9/5, 9/5>9/9, LTM EEG  no seizure. TSH stable CTA chest no PE no pneumonia RPR nonreactive bilateral LE Doppler negative for DVT Echo with normal EF  9/23 febrile secondary to Covid 19 + no pneumonia. 9/25 repeat CT brain unremarkable. 05/14/2022 Febrile. Noted to be +RSV.    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  Patient is alert, awake but does not communicate effectively Ambulates freely during the day.    Continue close supervision as she has unintentionally eloped.  -She was evaluated by Psych and neurology during this hospital stay.  -MRI negative, EEG negative,  -ANA negative in the past 09/2021. Heavy metal panel negative 09/2021   Bipolar  disorder Currently mood looks stable.  Continue Haldol, zyprexa ,hydroxyzine and Ativan as needed. Does not communicate  Hypertension Currently off medications.  Currently BP stable  CKD stage IIIb Currently kidney function is stable.  Monitor intermittently  Vitamin B12 deficiency Continue oral supplementation  Disposition/debility/deconditioning Medically stable for discharge.  Pending placement.  TOC following.      DVT prophylaxis:enoxaparin (LOVENOX) injection 40 mg Start: 12/26/21 1200     Code Status: Full Code  Family Communication: No family at bedside  Patient status:Inpatient  Patient is from : Outside behavioral Hospital  Anticipated discharge to: Long-term memory care  Estimated DC date: Not sure.  Anticipate next week.   Consultants:  Psychiatry Neurology  Procedures: None  Antimicrobials:  Anti-infectives (From admission, onward)    None       Subjective: Patient seen lying in the bed.  Curled up.  Not interested to talk.  Objective: Vitals:   06/05/22 2217 06/06/22 0806 06/07/22 0417 06/07/22 0850  BP: 109/70 131/84 115/83 115/72  Pulse: 98 (!) 111 99 (!) 54  Resp: 17 17 17 16  $ Temp: 98.6 F (37 C)  98.2 F (36.8 C) 98 F (36.7 C)  TempSrc:   Oral Oral  SpO2: 100% 100% 100% 93%  Weight:      Height:        Intake/Output Summary (Last 24 hours) at 06/07/2022 1233 Last data filed at 06/06/2022 2300 Gross per 24 hour  Intake 100 ml  Output --  Net 100 ml      Filed Weights   02/03/22 2025  Weight: 72 kg    Examination:  Looks comfortable.  Anxious on stimulation and talking.    Data Reviewed: I have personally reviewed following labs and imaging studies  CBC: Recent Labs  Lab 06/03/22 1718  WBC 4.9  NEUTROABS 2.5  HGB 13.0  HCT 39.1  MCV 86.7  PLT 123456    Basic Metabolic Panel: Recent Labs  Lab 06/03/22 1718 06/06/22 0943  NA 139 139  K 3.4* 4.3  CL 107 106  CO2 24 25  GLUCOSE 113* 82  BUN 22* 18   CREATININE 1.14* 1.12*  CALCIUM 8.9 9.3  MG 1.9  --       No results found for this or any previous visit (from the past 240 hour(s)).    Radiology Studies: No results found.  Scheduled Meds:  (feeding supplement) PROSource Plus  30 mL Oral BID BM   vitamin B-12  500 mcg Oral Daily   enoxaparin (LOVENOX) injection  40 mg Subcutaneous Q24H   feeding supplement (KATE FARMS STANDARD 1.4)  325 mL Oral BID BM   haloperidol  1 mg Oral BID   hydrocerin   Topical BID   Continuous Infusions:   LOS: 163 days   Total time spent: 25 minutes  Barb Merino, MD [facility] P2/16/2024, 12:33 PM

## 2022-06-08 NOTE — Progress Notes (Signed)
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 Transferred to [facility] hospital on 12/24/21 for AMS with possible catatonia. Psychiatry and Neurology evaluated patient during her stay.  Patient is medically stable for discharge and is awaiting placement at a memory care facility that is likely to happen on 2/26.     Major events: 09/07/2021 admitted to behavioral health.  Treated with Zyprexa Invega and Ativan challenge for catatonia. 12/24/2021 sent to ER from [facility] for evaluation for tachycardia 9/04-9/5, 9/5>9/9, LTM EEG  no seizure. TSH stable CTA chest no PE no pneumonia RPR nonreactive bilateral LE Doppler negative for DVT Echo with normal EF  9/23 febrile secondary to Covid 19 + no pneumonia. 9/25 repeat CT brain unremarkable. 05/14/2022 Febrile. Noted to be +RSV.    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  Patient is alert, awake but does not communicate effectively Ambulates freely during the day.    Continue close supervision as she has unintentionally eloped.  -She was evaluated by Psych and neurology during this hospital stay.  -MRI negative, EEG negative,  -ANA negative in the past 09/2021. Heavy metal panel  negative 09/2021   Bipolar disorder Currently mood looks stable.  Continue Haldol, zyprexa ,hydroxyzine and Ativan as needed. Does not communicate  Hypertension Currently off medications.  Currently BP stable  CKD stage IIIb Currently kidney function is stable.  Monitor intermittently  Vitamin B12 deficiency Continue oral supplementation  Disposition/debility/deconditioning Medically stable for discharge.  Pending placement.  TOC following.      DVT prophylaxis:enoxaparin (LOVENOX) injection 40 mg Start: 12/26/21 1200     Code Status: Full Code  Family Communication: No family at bedside  Patient status:Inpatient  Patient is from : Outside behavioral Hospital  Anticipated discharge to: Long-term memory care  Estimated DC date: Not sure.  Anticipate next week.   Consultants:  Psychiatry Neurology  Procedures: None  Antimicrobials:  Anti-infectives (From admission, onward)    None       Subjective:  No new events.  Nursing reported no overnight issues.  Looks comfortable.  Trying to get out of the bed.  Objective: Vitals:   06/06/22 0806 06/07/22 0417 06/07/22 0850 06/07/22 2320  BP: 131/84 115/83 115/72 (!) 130/112  Pulse: (!) 111 99 (!) 54 82  Resp: 17 17 16 14  $ Temp:  98.2 F (36.8 C) 98 F (36.7 C) 98 F (36.7 C)  TempSrc:  Oral Oral   SpO2: 100% 100% 93%   Weight:      Height:       No intake or output data in the 24 hours ending 06/08/22 1026    Filed Weights   02/03/22 2025  Weight: 72 kg    Examination: Looks comfortable.  Anxious as  always.  Trying to get out of the bed.    Data Reviewed: I have personally reviewed following labs and imaging studies  CBC: Recent Labs  Lab 06/03/22 1718  WBC 4.9  NEUTROABS 2.5  HGB 13.0  HCT 39.1  MCV 86.7  PLT 123456    Basic Metabolic Panel: Recent Labs  Lab 06/03/22 1718 06/06/22 0943  NA 139 139  K 3.4* 4.3  CL 107 106  CO2 24 25  GLUCOSE 113* 82  BUN 22* 18  CREATININE  1.14* 1.12*  CALCIUM 8.9 9.3  MG 1.9  --       No results found for this or any previous visit (from the past 240 hour(s)).    Radiology Studies: No results found.  Scheduled Meds:  (feeding supplement) PROSource Plus  30 mL Oral BID BM   vitamin B-12  500 mcg Oral Daily   enoxaparin (LOVENOX) injection  40 mg Subcutaneous Q24H   feeding supplement (KATE FARMS STANDARD 1.4)  325 mL Oral BID BM   haloperidol  1 mg Oral BID   hydrocerin   Topical BID   Continuous Infusions:   LOS: 164 days   Total time spent: 25 minutes  Barb Merino, MD [facility] P2/17/2024, 10:26 AM

## 2022-06-09 NOTE — Progress Notes (Signed)
PROGRESS NOTE  Natalie Brown  [id number] DOB: 14-Dec-1965 DOA: 12/24/2021 PCP: Marliss Coots, NP    Brief Narrative: Patient  is a 57 yo female with a PMH of Bipolar Affective Disorder and Chronic Dementia with Advanced Cerebral Atrophy and Behavioral Disturbances who was admitted on 09/07/21 with acute mania and aggressive behavior to [facility]. Transferred to [facility] hospital on 12/24/21 for AMS with possible catatonia. Psychiatry and Neurology evaluated patient during her stay.  Patient is medically stable for discharge and is awaiting placement at a memory care facility that is likely to happen on 2/26.     Major events: 09/07/2021 admitted to behavioral health.  Treated with Zyprexa Invega and Ativan challenge for catatonia. 12/24/2021 sent to ER from [facility] for evaluation for tachycardia 9/04-9/5, 9/5>9/9, LTM EEG  no seizure. TSH stable CTA chest no PE no pneumonia RPR nonreactive bilateral LE Doppler negative for DVT Echo with normal EF  9/23 febrile secondary to Covid 19 + no pneumonia. 9/25 repeat CT brain unremarkable. 05/14/2022 Febrile. Noted to be +RSV.    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  Patient is alert, awake but does not communicate effectively Ambulates freely during the day.    Continue close supervision as she has unintentionally eloped.  -She was evaluated by Psych and neurology during this hospital stay.  -MRI negative, EEG negative,  -ANA negative in the past 09/2021. Heavy metal panel  negative 09/2021   Bipolar disorder Currently mood looks stable.  Continue Haldol, zyprexa ,hydroxyzine and Ativan as needed. Does not communicate  Hypertension Currently off medications.  Currently BP stable  CKD stage IIIb Currently kidney function is stable.  Monitor intermittently  Vitamin B12 deficiency Continue oral supplementation  Disposition/debility/deconditioning Medically stable for discharge.  Pending placement.  TOC following.      DVT prophylaxis:enoxaparin (LOVENOX) injection 40 mg Start: 12/26/21 1200     Code Status: Full Code  Family Communication: No family at bedside  Patient status:Inpatient  Patient is from : Outside behavioral Hospital  Anticipated discharge to: Long-term memory care  Estimated DC date: Not sure.  Anticipate next week.   Consultants:  Psychiatry Neurology  Procedures: None  Antimicrobials:  Anti-infectives (From admission, onward)    None       Subjective:  No new events.  As usual, looks restless on stimulation.  Sleepy.  Objective: Vitals:   06/07/22 0850 06/07/22 2320 06/08/22 1939 06/09/22 0514  BP: 115/72 (!) 130/112 114/70 (!) 133/112  Pulse: (!) 54 82 95 (!) 109  Resp: 16 14 18 18  $ Temp: 98 F (36.7 C) 98 F (36.7 C) 98.6 F (37 C) 98.7 F (37.1 C)  TempSrc: Oral  Oral   SpO2: 93%  99% 96%  Weight:      Height:       No intake or output data in the 24 hours ending 06/09/22 1055    Filed Weights   02/03/22 2025  Weight: 72 kg    Examination: Looks comfortable.  Trying to get out of the  bed.    Data Reviewed: I have personally reviewed following labs and imaging studies  CBC: Recent Labs  Lab 06/03/22 1718  WBC 4.9  NEUTROABS 2.5  HGB 13.0  HCT 39.1  MCV 86.7  PLT 123456    Basic Metabolic Panel: Recent Labs  Lab 06/03/22 1718 06/06/22 0943  NA 139 139  K 3.4* 4.3  CL 107 106  CO2 24 25  GLUCOSE 113* 82  BUN 22* 18  CREATININE 1.14* 1.12*  CALCIUM 8.9 9.3  MG 1.9  --        No results found for this or any previous visit (from the past 240 hour(s)).    Radiology Studies: No results found.  Scheduled Meds:  (feeding supplement) PROSource Plus  30 mL Oral BID BM   vitamin B-12  500 mcg Oral Daily   enoxaparin (LOVENOX) injection  40 mg Subcutaneous Q24H   feeding supplement (KATE FARMS STANDARD 1.4)  325 mL Oral BID BM   haloperidol  1 mg Oral BID   hydrocerin   Topical BID   Continuous Infusions:   LOS: 165 days   Total time spent: 25 minutes  Barb Merino, MD [facility] P2/18/2024, 10:55 AM

## 2022-06-09 NOTE — Plan of Care (Signed)
  Problem: Clinical Measurements: Goal: Will remain free from infection 06/09/2022 0239 by Jule Ser, RN Outcome: Progressing 06/09/2022 0239 by Jule Ser, RN Outcome: Not Progressing Goal: Diagnostic test results will improve 06/09/2022 0239 by Jule Ser, RN Outcome: Progressing 06/09/2022 0239 by Jule Ser, RN Outcome: Not Progressing Goal: Respiratory complications will improve 06/09/2022 0239 by Jule Ser, RN Outcome: Progressing 06/09/2022 0239 by Jule Ser, RN Outcome: Not Progressing   Problem: Activity: Goal: Risk for activity intolerance will decrease 06/09/2022 0239 by Jule Ser, RN Outcome: Progressing 06/09/2022 0239 by Jule Ser, RN Outcome: Not Progressing   Problem: Nutrition: Goal: Adequate nutrition will be maintained 06/09/2022 0239 by Jule Ser, RN Outcome: Progressing 06/09/2022 0239 by Jule Ser, RN Outcome: Not Progressing

## 2022-06-10 MED ORDER — LORAZEPAM 0.5 MG PO TABS: 0.5000 mg | ORAL_TABLET | Freq: Four times a day (QID) | ORAL | 0 refills | Status: DC | PRN

## 2022-06-10 MED ORDER — HALOPERIDOL 1 MG PO TABS: 1.0000 mg | ORAL_TABLET | Freq: Two times a day (BID) | ORAL | 10 refills | Status: DC

## 2022-06-10 MED ORDER — BRIEFS OVERNIGHT MEDIUM MISC: 1.0000 | 2 refills | Status: DC | PRN

## 2022-06-10 MED ORDER — OLANZAPINE 5 MG PO TBDP: 5.0000 mg | ORAL_TABLET | Freq: Three times a day (TID) | ORAL | 0 refills | Status: DC | PRN

## 2022-06-10 MED ORDER — HYDROXYZINE HCL 25 MG PO TABS: 25.0000 mg | ORAL_TABLET | Freq: Three times a day (TID) | ORAL | 0 refills | Status: DC | PRN

## 2022-06-10 MED ORDER — CYANOCOBALAMIN 500 MCG PO TABS: 500.0000 ug | ORAL_TABLET | Freq: Every day | ORAL | 0 refills | Status: AC

## 2022-06-10 NOTE — Progress Notes (Addendum)
2:45pm: CSW spoke with patient's mother again to inform her that the visitor restriction has been removed and she is able to come and visit during normal visiting hours.  1:45pm: CSW spoke with patient's mother Gregary Signs who states she is in agreement to have a meeting on Thursday at 1:30pm to discuss the discharge plan.  12pm: CSW spoke with Jermaine at [facility] to have shower bench ordered.  11am: CSW spoke with Dedra at [facility] who is requesting a shower bench be ordered to aide in ADL's once patient is at the facility.  CSW spoke with MD to have order placed. CSW sent brief prescription to [employer] also.  8:30am: Patient has been added to Difficult to Place list for discharge planning.  Please contact me directly for any updates or questions regarding discharge planning.  The current discharge plan is for patient to discharge to [facility] on 06/17/22.  Madilyn Fireman, MSW, LCSW Transitions of Care  Clinical Social Worker II [phone]

## 2022-06-10 NOTE — Progress Notes (Signed)
Mobility Specialist - Progress Note   06/10/22 0921  Mobility  Activity Ambulated with assistance in hallway  Level of Assistance Standby assist, set-up cues, supervision of patient - no hands on  Assistive Device None  Distance Ambulated (ft) 70 ft  Activity Response Tolerated well  $Mobility charge 1 Mobility   Pt was received setting of bed alarm. SB throughout session. Pt returned to bed with bed alarm on.  Franki Monte  Mobility Specialist Please contact via Solicitor or Rehab office at [phone]

## 2022-06-10 NOTE — Progress Notes (Signed)
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 Transferred to [facility] hospital on 12/24/21 for AMS with possible catatonia. Psychiatry and Neurology evaluated patient during her stay.  Patient is medically stable for discharge and is awaiting placement at a memory care facility that is likely to happen on 2/26. Prescriptions and DME supplies all printed and ready for patient to be discharged next week.     Major events: 09/07/2021 admitted to behavioral health.  Treated with Zyprexa Invega and Ativan challenge for catatonia. 12/24/2021 sent to ER from [facility] for evaluation for tachycardia 9/04-9/5, 9/5>9/9, LTM EEG  no seizure. TSH stable CTA chest no PE no pneumonia RPR nonreactive bilateral LE Doppler negative for DVT Echo with normal EF  9/23 febrile secondary to Covid 19 + no pneumonia. 9/25 repeat CT brain unremarkable. 05/14/2022 Febrile. Noted to be +RSV.    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  Patient is alert, awake but does not communicate effectively Ambulates freely during the day.    Continue close supervision as she has unintentionally eloped.  -She was evaluated by Psych and neurology during this  hospital stay.  -MRI negative, EEG negative,  -ANA negative in the past 09/2021. Heavy metal panel negative 09/2021   Bipolar disorder Currently mood looks stable.  Continue Haldol, zyprexa ,hydroxyzine and Ativan as needed. Does not communicate  Hypertension Currently off medications.  Currently BP stable  CKD stage IIIb Currently kidney function is stable.  Monitor intermittently  Vitamin B12 deficiency Continue oral supplementation  Disposition/debility/deconditioning Medically stable for discharge.  Pending placement.  TOC following.      DVT prophylaxis:enoxaparin (LOVENOX) injection 40 mg Start: 12/26/21 1200     Code Status: Full Code  Family Communication: No family at bedside  Patient status:Inpatient  Patient is from : Outside behavioral Hospital  Anticipated discharge to: Long-term memory care  Estimated DC date: Not sure.  Anticipate next week.   Consultants:  Psychiatry Neurology  Procedures: None  Antimicrobials:  Anti-infectives (From admission, onward)    None       Subjective:  Patient seen and examined.  Nursing staff at the bedside.  Sleepy.  No new complaints.   Objective: Vitals:   06/09/22 1451 06/09/22 2036 06/10/22 0428 06/10/22 0751  BP: 115/79 (!) 142/74 128/77 128/73  Pulse: (!) 101 94 99 97  Resp: 17 19 16 18  $ Temp: 98.8 F (37.1 C) 97.9 F (36.6 C) 98 F (36.7 C) 98.7 F (37.1 C)  TempSrc: Oral Axillary Oral Oral  SpO2:  95% 97% 100%  Weight:      Height:       No intake or output data in the 24 hours ending 06/10/22 0953    Filed Weights   02/03/22  2025  Weight: 72 kg    Examination: Looks comfortable.  Trying to get out of the bed.    Data Reviewed: I have personally reviewed following labs and imaging studies  CBC: Recent Labs  Lab 06/03/22 1718  WBC 4.9  NEUTROABS 2.5  HGB 13.0  HCT 39.1  MCV 86.7  PLT 123456    Basic Metabolic Panel: Recent Labs  Lab 06/03/22 1718 06/06/22 0943  NA  139 139  K 3.4* 4.3  CL 107 106  CO2 24 25  GLUCOSE 113* 82  BUN 22* 18  CREATININE 1.14* 1.12*  CALCIUM 8.9 9.3  MG 1.9  --       No results found for this or any previous visit (from the past 240 hour(s)).    Radiology Studies: No results found.  Scheduled Meds:  (feeding supplement) PROSource Plus  30 mL Oral BID BM   vitamin B-12  500 mcg Oral Daily   enoxaparin (LOVENOX) injection  40 mg Subcutaneous Q24H   feeding supplement (KATE FARMS STANDARD 1.4)  325 mL Oral BID BM   haloperidol  1 mg Oral BID   hydrocerin   Topical BID   Continuous Infusions:   LOS: 166 days   Total time spent: 25 minutes  Barb Merino, MD [facility] P2/19/2024, 9:53 AM

## 2022-06-11 NOTE — Progress Notes (Addendum)
CSW spoke with Olivia Mackie at [facility] List regarding PCS application - expedited applications can only be submitted within 2 business days of discharge. CSW will resubmit application on [id number].  CSW spoke with Dedra of [facility] who states she spoke with Bill Salinas at [facility] office who states patient's SSA payments have been stopped due to lack of required documentation.  CSW sent secure e-mail to [employer] offering to assist with getting payments resumed to support placement as patient has been hospitalized for 168 days.   Madilyn Fireman, MSW, LCSW Transitions of Care  Clinical Social Worker II [phone]

## 2022-06-11 NOTE — Progress Notes (Signed)
PROGRESS NOTE  RICKELLE MCGIBBON  [id number] DOB: Aug 02, 1965 DOA: 12/24/2021 PCP: Marliss Coots, NP    Brief Narrative: Patient  is a 57 yo female with a PMH of Bipolar Affective Disorder and Chronic Dementia with Advanced Cerebral Atrophy and Behavioral Disturbances who was admitted on 09/07/21 with acute mania and aggressive behavior to [facility]. Transferred to [facility] hospital on 12/24/21 for AMS with possible catatonia. Psychiatry and Neurology evaluated patient during her stay.  Patient is medically stable for discharge and is awaiting placement at a memory care facility that is likely to happen on 2/26. Prescriptions and DME supplies all printed and ready for patient to be discharged next week.     Major events: 09/07/2021 admitted to behavioral health.  Treated with Zyprexa Invega and Ativan challenge for catatonia. 12/24/2021 sent to ER from [facility] for evaluation for tachycardia 9/04-9/5, 9/5>9/9, LTM EEG  no seizure. TSH stable CTA chest no PE no pneumonia RPR nonreactive bilateral LE Doppler negative for DVT Echo with normal EF  9/23 febrile secondary to Covid 19 + no pneumonia. 9/25 repeat CT brain unremarkable. 05/14/2022 Febrile. Noted to be +RSV.    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  Patient is alert, awake but does not communicate effectively Ambulates freely during the day.    Continue close supervision as she has unintentionally eloped.  -She was evaluated by Psych and neurology during this  hospital stay.  -MRI negative, EEG negative,  -ANA negative in the past 09/2021. Heavy metal panel negative 09/2021   Bipolar disorder Currently mood looks stable.  Continue Haldol, zyprexa ,hydroxyzine and Ativan as needed. Does not communicate  Hypertension Currently off medications.  Currently BP stable  CKD stage IIIb Currently kidney function is stable.  Monitor intermittently  Vitamin B12 deficiency Continue oral supplementation  Disposition/debility/deconditioning Medically stable for discharge.  Pending placement.  TOC following.      DVT prophylaxis:enoxaparin (LOVENOX) injection 40 mg Start: 12/26/21 1200     Code Status: Full Code  Family Communication: No family at bedside  Patient status:Inpatient  Patient is from : Outside behavioral Hospital  Anticipated discharge to: Long-term memory care  Estimated DC date: Not sure.  Anticipate next week.   Consultants:  Psychiatry Neurology  Procedures: None  Antimicrobials:  Anti-infectives (From admission, onward)    None       Subjective:  No new events.  Patient declines to talk.   Objective: Vitals:   06/10/22 0751 06/10/22 1609 06/11/22 0453 06/11/22 0717  BP: 128/73 99/64 116/74 (!) 132/99  Pulse: 97  81 78  Resp: 18 18 17 18  $ Temp: 98.7 F (37.1 C) 98.5 F (36.9 C) 97.9 F (36.6 C) 97.8 F (36.6 C)  TempSrc: Oral Axillary Oral Oral  SpO2: 100%  100% 100%  Weight:      Height:       No intake or output data in the 24 hours ending 06/11/22 1022    Filed Weights   02/03/22 2025  Weight: 72 kg    Examination:  Looks comfortable.  Declines to interview.    Data Reviewed: I have personally reviewed following labs and imaging studies  CBC: No results for input(s): "WBC", "NEUTROABS", "HGB", "HCT", "MCV", "PLT" in the last 168 hours.  Basic Metabolic Panel: Recent Labs  Lab 06/06/22 0943  NA 139  K 4.3  CL 106  CO2 25  GLUCOSE 82  BUN 18  CREATININE 1.12*  CALCIUM  9.3      No results found for this or any previous visit (from the past 240 hour(s)).    Radiology Studies: No results found.  Scheduled Meds:  (feeding supplement) PROSource Plus  30 mL Oral BID BM   vitamin B-12  500 mcg Oral Daily   enoxaparin (LOVENOX) injection  40 mg Subcutaneous Q24H   feeding supplement (KATE FARMS STANDARD 1.4)  325 mL Oral BID BM   haloperidol  1 mg Oral BID   hydrocerin   Topical BID   Continuous Infusions:   LOS: 167 days   Total time spent: 25 minutes  Barb Merino, MD [facility] P2/20/2024, 10:22 AM

## 2022-06-11 NOTE — Progress Notes (Signed)
Mobility Specialist - Progress Note   06/11/22 1538  Mobility  Activity Ambulated independently in hallway  Level of Assistance Independent  Assistive Device None  Distance Ambulated (ft) 300 ft  Activity Response Tolerated well  $Mobility charge 1 Mobility   Pt received in hallway. No complaints throughout. Pt left ambulating with family member.   Franki Monte  Mobility Specialist Please contact via Solicitor or Rehab office at [phone]

## 2022-06-12 NOTE — Progress Notes (Signed)
CSW spoke with patient's mother to provide her with contact information for Bill Salinas at [facility] office. Gregary Signs will reach out to Mr. Pate to determine what needs to be done for Kristin's SSA payments to be resumed.  Madilyn Fireman, MSW, LCSW Transitions of Care  Clinical Social Worker II [phone]

## 2022-06-12 NOTE — Progress Notes (Signed)
Mobility Specialist - Progress Note   06/12/22 1000  Mobility  Activity Ambulated independently in hallway  Level of Assistance Independent  Assistive Device None  Distance Ambulated (ft) 100 ft  Activity Response Tolerated well  $Mobility charge 1 Mobility   Pt was received at door way and agreeable. No fault throughout session. Pt was returned to bed with all needs met.  Franki Monte  Mobility Specialist Please contact via Solicitor or Rehab office at [phone]

## 2022-06-12 NOTE — Progress Notes (Signed)
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 Transferred to [facility] hospital on 12/24/21 for AMS with possible catatonia. Psychiatry and Neurology evaluated patient during her stay.  Patient is medically stable for discharge and is awaiting placement at a memory care facility that is likely to happen on 2/26. Prescriptions and DME supplies all printed and ready for patient to be discharged next week.     Major events: 09/07/2021 admitted to behavioral health.  Treated with Zyprexa Invega and Ativan challenge for catatonia. 12/24/2021 sent to ER from [facility] for evaluation for tachycardia 9/04-9/5, 9/5>9/9, LTM EEG  no seizure. TSH stable CTA chest no PE no pneumonia RPR nonreactive bilateral LE Doppler negative for DVT Echo with normal EF  9/23 febrile secondary to Covid 19 + no pneumonia. 9/25 repeat CT brain unremarkable. 05/14/2022 Febrile. Noted to be +RSV.    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  Patient is alert, awake but does not communicate effectively Ambulates freely during the day.    Continue close supervision as she has unintentionally eloped.  -She was evaluated by Psych and neurology during this  hospital stay.  -MRI negative, EEG negative,  -ANA negative in the past 09/2021. Heavy metal panel negative 09/2021   Bipolar disorder Currently mood looks stable.  Continue Haldol, zyprexa ,hydroxyzine and Ativan as needed. Does not communicate  Hypertension Currently off medications.  Currently BP stable  CKD stage IIIb Currently kidney function is stable.  Monitor intermittently  Vitamin B12 deficiency Continue oral supplementation  Disposition/debility/deconditioning Medically stable for discharge.  Pending placement.  TOC following.      DVT prophylaxis:enoxaparin (LOVENOX) injection 40 mg Start: 12/26/21 1200     Code Status: Full Code  Family Communication: No family at bedside  Patient status:Inpatient  Patient is from : Outside behavioral Hospital  Anticipated discharge to: Long-term memory care  Estimated DC date: Not sure.  Anticipate next week.   Consultants:  Psychiatry Neurology  Procedures: None  Antimicrobials:  Anti-infectives (From admission, onward)    None       Subjective:  No new events.  Walked with Production designer, theatre/television/film in the hallway.  Does not want to talk.   Objective: Vitals:   06/11/22 1549 06/11/22 1930 06/12/22 0514 06/12/22 0800  BP: 110/71 121/70  112/68  Pulse: 78 77 78 87  Resp: 18 16 17 18  $ Temp: 98 F (36.7 C) 98 F (36.7 C) 98.5 F (36.9 C) 98.3 F (36.8 C)  TempSrc: Oral Oral  Oral  SpO2: 100%  100% 100%  Weight:      Height:       No intake or output data in the 24 hours ending 06/12/22 1011    Filed Weights   02/03/22 2025  Weight: 72 kg    Examination: Looks comfortable, walking in the hallway.   Data Reviewed: I have personally reviewed following labs and imaging studies  CBC: No results for input(s): "WBC", "NEUTROABS", "HGB", "HCT", "MCV", "PLT" in the last 168 hours.  Basic Metabolic Panel: Recent Labs  Lab 06/06/22 0943  NA 139  K 4.3  CL 106  CO2 25  GLUCOSE 82  BUN 18   CREATININE 1.12*  CALCIUM 9.3      No results found for this or any previous visit (from the past 240 hour(s)).    Radiology Studies: No results found.  Scheduled Meds:  (feeding supplement) PROSource Plus  30 mL Oral BID BM   vitamin B-12  500 mcg Oral Daily   enoxaparin (LOVENOX) injection  40 mg Subcutaneous Q24H   feeding supplement (KATE FARMS STANDARD 1.4)  325 mL Oral BID BM   haloperidol  1 mg Oral BID   hydrocerin   Topical BID   Continuous Infusions:   LOS: 168 days   Total time spent: 25 minutes  Barb Merino, MD [facility] P2/21/2024, 10:11 AM

## 2022-06-13 NOTE — Progress Notes (Signed)
PROGRESS NOTE  Natalie Brown  [id number] DOB: 01/03/1966 DOA: 12/24/2021 PCP: Marliss Coots, NP    Brief Narrative: Patient  is a 57 yo female with a PMH of Bipolar Affective Disorder and Chronic Dementia with Advanced Cerebral Atrophy and Behavioral Disturbances who was admitted on 09/07/21 with acute mania and aggressive behavior to [facility]. Transferred to [facility] hospital on 12/24/21 for AMS with possible catatonia. Psychiatry and Neurology evaluated patient during her stay.  Patient is medically stable for discharge and is awaiting placement at a memory care facility that is likely to happen on 2/26. Prescriptions and DME supplies all printed and ready for patient to be discharged next week.     Major events: 09/07/2021 admitted to behavioral health.  Treated with Zyprexa Invega and Ativan challenge for catatonia. 12/24/2021 sent to ER from [facility] for evaluation for tachycardia 9/04-9/5, 9/5>9/9, LTM EEG  no seizure. TSH stable CTA chest no PE no pneumonia RPR nonreactive bilateral LE Doppler negative for DVT Echo with normal EF  9/23 febrile secondary to Covid 19 + no pneumonia. 9/25 repeat CT brain unremarkable. 05/14/2022 Febrile. Noted to be +RSV.  All improved.    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  Patient is alert, awake but does not communicate effectively Ambulates freely during the day.    Continue close supervision as she has unintentionally eloped.  -She was evaluated by Psych and neurology  during this hospital stay.  -MRI negative, EEG negative,  -ANA negative in the past 09/2021. Heavy metal panel negative 09/2021   Bipolar disorder Currently mood looks stable.  Continue Haldol, zyprexa ,hydroxyzine and Ativan as needed. Does not communicate  Hypertension Currently off medications.  Currently BP stable  CKD stage IIIb Currently kidney function is stable.  Monitor intermittently  Vitamin B12 deficiency Continue oral supplementation  Disposition/debility/deconditioning Medically stable for discharge.  Pending placement.  TOC following.      DVT prophylaxis:enoxaparin (LOVENOX) injection 40 mg Start: 12/26/21 1200     Code Status: Full Code  Family Communication: No family at bedside  Patient status:Inpatient  Patient is from : Outside behavioral Hospital  Anticipated discharge to: Long-term memory care  Estimated DC date: Not sure.  Anticipate next week.   Consultants:  Psychiatry Neurology  Procedures: None  Antimicrobials:  Anti-infectives (From admission, onward)    None       Subjective:  Patient seen and examined.  Sitter at the bedside.  [name] not able to keep up any communication.  Curled up in the bed.   Objective: Vitals:   06/12/22 0514 06/12/22 0800 06/12/22 2201 06/13/22 0428  BP:  112/68 (!) 90/50 (!) 107/91  Pulse: 78 87 (!) 59 91  Resp: 17 18 18 18  $ Temp: 98.5 F (36.9 C) 98.3 F (36.8 C) (!) 97.5 F (36.4 C) 98 F (36.7 C)  TempSrc:  Oral Axillary Oral  SpO2: 100% 100% 100% 100%  Weight:      Height:       No intake or output data in  the 24 hours ending 06/13/22 0954    Filed Weights   02/03/22 2025  Weight: 72 kg    Examination:  Curled up in the bed. No eye contact.  Does not communicate.   Data Reviewed: I have personally reviewed following labs and imaging studies  CBC: No results for input(s): "WBC", "NEUTROABS", "HGB", "HCT", "MCV", "PLT" in the last 168 hours.  Basic Metabolic Panel: No  results for input(s): "NA", "K", "CL", "CO2", "GLUCOSE", "BUN", "CREATININE", "CALCIUM", "MG", "PHOS" in the last 168 hours.    No results found for this or any previous visit (from the past 240 hour(s)).    Radiology Studies: No results found.  Scheduled Meds:  (feeding supplement) PROSource Plus  30 mL Oral BID BM   vitamin B-12  500 mcg Oral Daily   enoxaparin (LOVENOX) injection  40 mg Subcutaneous Q24H   feeding supplement (KATE FARMS STANDARD 1.4)  325 mL Oral BID BM   haloperidol  1 mg Oral BID   hydrocerin   Topical BID   Continuous Infusions:   LOS: 169 days   Total time spent: 25 minutes  Barb Merino, MD [facility] P2/22/2024, 9:54 AM

## 2022-06-13 NOTE — Progress Notes (Signed)
Mobility Specialist - Progress Note   06/13/22 1414  Mobility  Activity Ambulated independently in room  Level of Assistance Independent  Assistive Device None  Distance Ambulated (ft) 40 ft  Activity Response Tolerated well  $Mobility charge 1 Mobility   Pt received setting off bed alarm and trying to unhook bed from wall. Pt was returned to bed with all needs met bed alarm on.  Franki Monte  Mobility Specialist Please contact via Solicitor or Rehab office at [phone]

## 2022-06-14 NOTE — Progress Notes (Signed)
PROGRESS NOTE  Natalie Brown  [id number] DOB: Nov 04, 1965 DOA: 12/24/2021 PCP: Marliss Coots, NP    Brief Narrative: Patient  is a 57 yo female with a PMH of Bipolar Affective Disorder and Chronic Dementia with Advanced Cerebral Atrophy and Behavioral Disturbances who was admitted on 09/07/21 with acute mania and aggressive behavior to [facility]. Transferred to [facility] hospital on 12/24/21 for AMS with possible catatonia. Psychiatry and Neurology evaluated patient during her stay.  Patient is medically stable for discharge and is awaiting placement at a memory care facility that is likely to happen on 2/26. Prescriptions and DME supplies all printed and ready for patient to be discharged next week.     Major events: 09/07/2021 admitted to behavioral health.  Treated with Zyprexa Invega and Ativan challenge for catatonia. 12/24/2021 sent to ER from [facility] for evaluation for tachycardia 9/04-9/5, 9/5>9/9, LTM EEG  no seizure. TSH stable CTA chest no PE no pneumonia RPR nonreactive bilateral LE Doppler negative for DVT Echo with normal EF  9/23 febrile secondary to Covid 19 + no pneumonia. 9/25 repeat CT brain unremarkable. 05/14/2022 Febrile. Noted to be +RSV.  All improved.    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  Patient is alert, awake but does not communicate effectively Ambulates freely during the day.    Continue close supervision as she has unintentionally eloped.  -She was evaluated by Psych and neurology  during this hospital stay.  -MRI negative, EEG negative,  -ANA negative in the past 09/2021. Heavy metal panel negative 09/2021   Bipolar disorder Currently mood looks stable.  Continue Haldol, zyprexa ,hydroxyzine and Ativan as needed. Does not communicate  Hypertension Currently off medications.  Currently BP stable  CKD stage IIIb Currently kidney function is stable.  Monitor intermittently  Vitamin B12 deficiency Continue oral supplementation  Disposition/debility/deconditioning Medically stable for discharge.  Pending placement.  TOC following.  Likely discharge Monday.      DVT prophylaxis:enoxaparin (LOVENOX) injection 40 mg Start: 12/26/21 1200     Code Status: Full Code  Family Communication: No family at bedside  Patient status:Inpatient  Patient is from : Outside behavioral Hospital  Anticipated discharge to: Long-term memory care boarding home.  Estimated DC date: Not sure.  Anticipate next week.   Consultants:  Psychiatry Neurology  Procedures: None  Antimicrobials:  Anti-infectives (From admission, onward)    None       Subjective:  No new events.  Curled up in the bed.   Objective: Vitals:   06/12/22 2201 06/13/22 0428 06/13/22 2009 06/14/22 0516  BP: (!) 90/50 (!) 107/91 103/71 131/80  Pulse: (!) 59 91 95 (!) 105  Resp: '18 18 18 16  '$ Temp: (!) 97.5 F (36.4 C) 98 F (36.7 C) 98.1 F (36.7 C) 98 F (36.7 C)  TempSrc: Axillary Oral Oral Oral  SpO2: 100% 100% 100% 100%  Weight:      Height:        Intake/Output Summary (Last 24 hours) at 06/14/2022 1017 Last data filed at 06/13/2022 2054  Gross per 24 hour  Intake 360 ml  Output --  Net 360 ml      Filed Weights   02/03/22 2025  Weight: 72 kg    Examination:  Curled up in the bed. No eye contact.  Does not communicate.   Data Reviewed: I have personally reviewed following labs and imaging studies  CBC: No results for input(s): "WBC", "NEUTROABS", "HGB", "HCT",  "MCV", "PLT" in the last 168 hours.  Basic Metabolic Panel: No results for input(s): "NA", "K", "CL", "CO2", "GLUCOSE", "BUN", "CREATININE", "CALCIUM", "MG", "PHOS" in the last 168 hours.    No results found for this or any previous visit (from the past 240 hour(s)).    Radiology Studies: No results found.  Scheduled Meds:  (feeding supplement) PROSource Plus  30 mL Oral BID BM   vitamin B-12  500 mcg Oral Daily   enoxaparin (LOVENOX) injection  40 mg Subcutaneous Q24H   feeding supplement (KATE FARMS STANDARD 1.4)  325 mL Oral BID BM   haloperidol  1 mg Oral BID   hydrocerin   Topical BID   Continuous Infusions:   LOS: 170 days   Total time spent: 25 minutes  Barb Merino, MD [facility] P2/23/2024, 10:17 AM

## 2022-06-15 NOTE — Progress Notes (Signed)
PROGRESS NOTE  Natalie Brown  [id number] DOB: 03-Jul-1965 DOA: 12/24/2021 PCP: Marliss Coots, NP    Brief Narrative: Patient  is a 57 yo female with a PMH of Bipolar Affective Disorder and Chronic Dementia with Advanced Cerebral Atrophy and Behavioral Disturbances who was admitted on 09/07/21 with acute mania and aggressive behavior to [facility]. Transferred to [facility] hospital on 12/24/21 for AMS with possible catatonia. Psychiatry and Neurology evaluated patient during her stay.  Patient is medically stable for discharge and is awaiting placement at a memory care facility that is likely to happen on 2/26. Prescriptions and DME supplies all printed and ready for patient to be discharged, hopefully on 2/26.   Major events: 09/07/2021 admitted to behavioral health.  Treated with Zyprexa Invega and Ativan challenge for catatonia. 12/24/2021 sent to ER from [facility] for evaluation for tachycardia 9/04-9/5, 9/5>9/9, LTM EEG  no seizure. TSH stable CTA chest no PE no pneumonia RPR nonreactive bilateral LE Doppler negative for DVT Echo with normal EF  9/23 febrile secondary to Covid 19 + no pneumonia. 9/25 repeat CT brain unremarkable. 05/14/2022 Febrile. Noted to be +RSV.  All improved.    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  Patient is alert, awake but does not communicate effectively Ambulates freely during the day.  Continue close supervision as she has unintentionally eloped.  -She was evaluated by Psych and neurology  during this hospital stay.  -MRI negative, EEG negative,  -ANA negative in the past 09/2021. Heavy metal panel negative 09/2021   Bipolar disorder Currently mood looks stable.  Continue Haldol, zyprexa ,hydroxyzine and Ativan as needed. Does not communicate  Hypertension Currently off medications.  Currently BP stable  CKD stage IIIb Currently kidney function is stable.  Monitor intermittently  Vitamin B12 deficiency Continue oral supplementation  Disposition/debility/deconditioning Medically stable for discharge.  Pending placement.  TOC following.  Likely discharge Monday.   DVT prophylaxis:enoxaparin (LOVENOX) injection 40 mg Start: 12/26/21 1200 Code Status: Full Code Family Communication: No family at bedside  Patient status:Inpatient  Patient is from : Outside behavioral Hospital  Anticipated discharge to: Long-term memory care boarding home.  Estimated DC date: Not sure.  Anticipate next week.   Consultants:  Psychiatry Neurology  Procedures: None  Antimicrobials:  Anti-infectives (From admission, onward)    None       Subjective: Does not really communicate.  Curled up in the bed.   Objective: Vitals:   06/13/22 0428 06/13/22 2009 06/14/22 0516 06/14/22 1030  BP: (!) 107/91 103/71 131/80 129/75  Pulse: 91 95 (!) 105   Resp: '18 18 16 17  '$ Temp: 98 F (36.7 C) 98.1 F (36.7 C) 98 F (36.7 C) 98.2 F (36.8 C)  TempSrc: Oral Oral Oral Oral  SpO2: 100% 100% 100% 99%  Weight:      Height:        Intake/Output Summary (Last 24 hours) at 06/15/2022 1058 Last data filed at 06/14/2022 1156 Gross per 24 hour  Intake 240 ml  Output --  Net 240  ml      Filed Weights   02/03/22 2025  Weight: 72 kg    Examination:  Looks at me very suspiciously.  Does not allow examination.   Data Reviewed:   CBC: No results for input(s): "WBC", "NEUTROABS", "HGB", "HCT", "MCV", "PLT" in the last 168 hours.  Basic Metabolic Panel: No results for  input(s): "NA", "K", "CL", "CO2", "GLUCOSE", "BUN", "CREATININE", "CALCIUM", "MG", "PHOS" in the last 168 hours.    No results found for this or any previous visit (from the past 240 hour(s)).    Radiology Studies: No results found.  Scheduled Meds:  (feeding supplement) PROSource Plus  30 mL Oral BID BM   vitamin B-12  500 mcg Oral Daily   enoxaparin (LOVENOX) injection  40 mg Subcutaneous Q24H   feeding supplement (KATE FARMS STANDARD 1.4)  325 mL Oral BID BM   haloperidol  1 mg Oral BID   hydrocerin   Topical BID   Continuous Infusions:   LOS: 171 days     Bonnielee Haff, MD [facility] P2/24/2024, 10:58 AM

## 2022-06-16 LAB — CBC
HCT: 39.3 % (ref 36.0–46.0)
Hemoglobin: 13.5 g/dL (ref 12.0–15.0)
MCH: 29.5 pg (ref 26.0–34.0)
MCHC: 34.4 g/dL (ref 30.0–36.0)
MCV: 86 fL (ref 80.0–100.0)
Platelets: 295 10*3/uL (ref 150–400)
RBC: 4.57 MIL/uL (ref 3.87–5.11)
RDW: 13.9 % (ref 11.5–15.5)
WBC: 5.7 10*3/uL (ref 4.0–10.5)
nRBC: 0 % (ref 0.0–0.2)

## 2022-06-16 LAB — BASIC METABOLIC PANEL
Anion gap: 8 (ref 5–15)
BUN: 18 mg/dL (ref 6–20)
CO2: 26 mmol/L (ref 22–32)
Calcium: 9.2 mg/dL (ref 8.9–10.3)
Chloride: 103 mmol/L (ref 98–111)
Creatinine, Ser: 1.17 mg/dL — ABNORMAL HIGH (ref 0.44–1.00)
GFR, Estimated: 55 mL/min — ABNORMAL LOW (ref >60)
Glucose, Bld: 106 mg/dL — ABNORMAL HIGH (ref 70–99)
Potassium: 3.7 mmol/L (ref 3.5–5.1)
Sodium: 137 mmol/L (ref 135–145)

## 2022-06-16 NOTE — Progress Notes (Signed)
PROGRESS NOTE  Natalie Brown  [id number] DOB: 19-Jun-1965 DOA: 12/24/2021 PCP: Marliss Coots, NP    Brief Narrative: Patient  is a 57 yo female with a PMH of Bipolar Affective Disorder and Chronic Dementia with Advanced Cerebral Atrophy and Behavioral Disturbances who was admitted on 09/07/21 with acute mania and aggressive behavior to [name]. Transferred to [facility] hospital on 12/24/21 for AMS with possible catatonia. Psychiatry and Neurology evaluated patient during her stay. Patient is medically stable for discharge and is awaiting placement at a memory care facility that is likely to happen on 2/26. Prescriptions and DME supplies all printed and ready for patient to be discharged, hopefully on 2/26.   Major events: 09/07/2021 admitted to behavioral health.  Treated with Zyprexa Invega and Ativan challenge for catatonia. 12/24/2021 sent to ER from [facility] for evaluation for tachycardia 9/04-9/5, 9/5>9/9, LTM EEG  no seizure. TSH stable CTA chest no PE no pneumonia RPR nonreactive bilateral LE Doppler negative for DVT Echo with normal EF  9/23 febrile secondary to Covid 19 + no pneumonia. 9/25 repeat CT brain unremarkable. 05/14/2022 Febrile. Noted to be +RSV.  All improved.    Assessment & Plan:  Acute metabolic encephalopathy on the background of chronic progressive dementia  - Currently mental status at baseline.  Patient is alert, awake but does not communicate effectively Ambulates freely during the day.  Continue close supervision as she has unintentionally eloped.  -She was evaluated by Psych and neurology during this hospital stay.  -MRI negative, EEG negative,  -ANA negative in the past 09/2021. Heavy metal panel negative 09/2021  Neurological status is stable.  Bipolar disorder Currently mood looks stable.  Continue Haldol, zyprexa ,hydroxyzine and Ativan as needed. Does not communicate  Hypertension Currently off medications.  Currently BP stable  CKD stage IIIb Currently  kidney function is stable.  Monitor intermittently.  Stable labs from this morning.  Vitamin B12 deficiency Continue oral supplementation  Disposition/debility/deconditioning Medically stable for discharge.  Pending placement.  TOC following.  Likely discharge Monday.   DVT prophylaxis:enoxaparin (LOVENOX) injection 40 mg Start: 12/26/21 1200 Code Status: Full Code Family Communication: No family at bedside  Patient status:Inpatient  Patient is from : Outside behavioral Hospital  Anticipated discharge to: Long-term memory care boarding home.   Consultants:  Psychiatry Neurology  Procedures: None  Antimicrobials:  Anti-infectives (From admission, onward)    None       Subjective: Curled up in bed.  Does not communicate.   Objective: Vitals:   06/13/22 2009 06/14/22 0516 06/14/22 1030 06/15/22 2124  BP: 103/71 131/80 129/75 111/83  Pulse: 95 (!) 105  98  Resp: '18 16 17 17  '$ Temp: 98.1 F (36.7 C) 98 F (36.7 C) 98.2 F (36.8 C) 98.4 F (36.9 C)  TempSrc: Oral Oral Oral   SpO2: 100% 100% 99%   Weight:      Height:       No intake or output data in the 24 hours ending 06/16/22 1000    Filed Weights   02/03/22 2025  Weight: 72 kg    Examination:  Does not really communicate.  Not allowing examination.  No issues reported by nursing staff.  Vital signs reviewed.   Data Reviewed:   CBC: Recent Labs  Lab 06/16/22 0459  WBC 5.7  HGB 13.5  HCT 39.3  MCV 86.0  PLT [id number]    Basic Metabolic Panel: Recent Labs  Lab 06/16/22 0459  NA 137  K 3.7  CL 103  CO2 26  GLUCOSE 106*  BUN 18  CREATININE 1.17*  CALCIUM 9.2        Radiology Studies: No results found.  Scheduled Meds:  (feeding supplement) PROSource Plus  30 mL Oral BID BM   vitamin B-12  500 mcg Oral Daily   enoxaparin (LOVENOX) injection  40 mg Subcutaneous Q24H   feeding supplement (KATE FARMS STANDARD 1.4)  325 mL Oral BID BM   haloperidol  1 mg Oral BID   hydrocerin    Topical BID   Continuous Infusions:   LOS: 172 days     Bonnielee Haff, MD [facility] P2/25/2024, 10:00 AM

## 2022-06-16 NOTE — Progress Notes (Signed)
Mobility Specialist - Progress Note   06/16/22 0910  Mobility  Activity Ambulated independently in hallway  Level of Assistance Independent  Assistive Device None  Distance Ambulated (ft) 70 ft  Activity Response Tolerated well  $Mobility charge 1 Mobility   Pt was received in hallway and needing help back to room. No complaints. Pt returned to bed with all needs met and bed alarm on.  Franki Monte  Mobility Specialist Please contact via Solicitor or Rehab office at [phone]

## 2022-06-17 NOTE — Progress Notes (Signed)
Mobility Specialist - Progress Note   06/17/22 1031  Mobility  Activity Ambulated independently in hallway  Level of Assistance Independent  Assistive Device None  Distance Ambulated (ft) 70 ft  Activity Response Tolerated well  $Mobility charge 1 Mobility   Pt received wandering hallway needing assistance back to room. No fault throughout. Pt returned to bed with all needs met. Bed alarm on.  Franki Monte  Mobility Specialist Please contact via Solicitor or Rehab office at [phone]

## 2022-06-17 NOTE — Progress Notes (Addendum)
CSW spoke with patient's mother Gregary Signs who states she has not been able to speak directly with Bill Salinas of [employer].   CSW sent secure e-mail to [facility] requesting this matter be addressed ASAP.  Patient cannot be discharged until funds are reinstated by the [employer].  Madilyn Fireman, MSW, LCSW Transitions of Care  Clinical Social Worker II [phone]

## 2022-06-17 NOTE — Progress Notes (Signed)
PROGRESS NOTE    Natalie Brown  [id number] DOB: 1965/09/10 DOA: 12/24/2021 PCP: Marliss Coots, NP   Brief Narrative:   57 yo female with a PMH of Bipolar Affective Disorder and Chronic Dementia with Advanced Cerebral Atrophy and Behavioral Disturbances who was admitted on 09/07/21 with acute mania and aggressive behavior to [facility]. Transferred to [facility] hospital on 12/24/21 for AMS with possible catatonia. Psychiatry and Neurology evaluated patient during her stay. Patient is medically stable for discharge and is awaiting placement at a memory care facility that is likely to happen on 2/26. Prescriptions and DME supplies all printed and ready for patient to be discharged, hopefully on 2/26.   Major events: 09/07/2021 admitted to behavioral health.  Treated with Zyprexa Invega and Ativan challenge for catatonia. 12/24/2021 sent to ER from [facility] for evaluation for tachycardia 9/04-9/5, 9/5>9/9, LTM EEG  no seizure. TSH stable CTA chest no PE no pneumonia RPR nonreactive bilateral LE Doppler negative for DVT Echo with normal EF  9/23 febrile secondary to Covid 19 + no pneumonia. 9/25 repeat CT brain unremarkable. 05/14/2022 Febrile. Noted to be +RSV.  All improved.   Assessment & Plan:  Principal Problem:   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) Active Problems:   Delirium due to multiple etiologies, acute, hyperactive   Bipolar affective disorder, current episode manic with psychotic symptoms (HCC)   Sinus tachycardia   Tobacco abuse   Altered mental state   B12 deficiency   COVID-19 virus infection   Drooling   Essential hypertension     Acute metabolic encephalopathy on the background of chronic progressive dementia  Mental status remains at baseline.  Awake alert.  Ambulatory in the hallway but does not participate much in conversation. Ambulates freely during the day.  Continue close supervision as she has unintentionally eloped.  -She was evaluated by Psych  and neurology during this hospital stay.  -MRI negative, EEG negative,  -ANA negative in the past 09/2021. Heavy metal panel negative 09/2021  Neurological status is stable.   Bipolar disorder Currently mood looks stable.  Continue Haldol, zyprexa ,hydroxyzine and Ativan as needed. Does not communicate   Hypertension Currently off medications.  Currently BP stable   CKD stage IIIb Currently kidney function is stable.  Overall labs appear to be stable   Vitamin B12 deficiency Continue oral supplementation   Disposition/debility/deconditioning Medically stable for discharge.  Pending placement.  TOC following.  Likely discharge Monday.     DVT prophylaxis:enoxaparin (LOVENOX) injection 40 mg Start: 12/26/21 1200 Code Status: Full Code Family Communication: No family at bedside   Patient status:Inpatient   Patient is from : Outside behavioral Hospital   Anticipated discharge to: Long-term memory care boarding home.  Pending placement, discussed with TOC today      Subjective: Patient was seen to be ambulating in the hallway without any issues.  Will Later when I went in the room she was sitting in the bed but staring at the wall.  Did not really participate in any meaningful conversation with me.   Examination:  General exam: Appears calm and comfortable  Respiratory system: Clear to auscultation. Respiratory effort normal. Cardiovascular system: S1 & S2 heard, RRR. No JVD, murmurs, rubs, gallops or clicks. No pedal edema. Gastrointestinal system: Abdomen is nondistended, soft and nontender. No organomegaly or masses felt. Normal bowel sounds heard. Central nervous system: Unable to assess Extremities: Symmetric 5 x 5 power. Skin: No rashes, lesions or ulcers Psychiatry: Judgement and insight appear poor  Objective: Vitals:   06/14/22 1030 06/15/22 2124 06/16/22 0930 06/16/22 2046  BP: 129/75 111/83 121/76 116/66  Pulse:  98 78 (!) 104  Resp: '17 17 18 17  '$ Temp:  98.2 F (36.8 C) 98.4 F (36.9 C) 98.2 F (36.8 C) 98.3 F (36.8 C)  TempSrc: Oral  Oral   SpO2: 99%     Weight:      Height:        Intake/Output Summary (Last 24 hours) at 06/17/2022 1048 Last data filed at 06/17/2022 0400 Gross per 24 hour  Intake 180 ml  Output --  Net 180 ml   Filed Weights   02/03/22 2025  Weight: 72 kg     Data Reviewed:   CBC: Recent Labs  Lab 06/16/22 0459  WBC 5.7  HGB 13.5  HCT 39.3  MCV 86.0  PLT [id number]   Basic Metabolic Panel: Recent Labs  Lab 06/16/22 0459  NA 137  K 3.7  CL 103  CO2 26  GLUCOSE 106*  BUN 18  CREATININE 1.17*  CALCIUM 9.2   GFR: Estimated Creatinine Clearance: 55.2 mL/min (A) (by C-G formula based on SCr of 1.17 mg/dL (H)). Liver Function Tests: No results for input(s): "AST", "ALT", "ALKPHOS", "BILITOT", "PROT", "ALBUMIN" in the last 168 hours. No results for input(s): "LIPASE", "AMYLASE" in the last 168 hours. No results for input(s): "AMMONIA" in the last 168 hours. Coagulation Profile: No results for input(s): "INR", "PROTIME" in the last 168 hours. Cardiac Enzymes: No results for input(s): "CKTOTAL", "CKMB", "CKMBINDEX", "TROPONINI" in the last 168 hours. BNP (last 3 results) No results for input(s): "PROBNP" in the last 8760 hours. HbA1C: No results for input(s): "HGBA1C" in the last 72 hours. CBG: No results for input(s): "GLUCAP" in the last 168 hours. Lipid Profile: No results for input(s): "CHOL", "HDL", "LDLCALC", "TRIG", "CHOLHDL", "LDLDIRECT" in the last 72 hours. Thyroid Function Tests: No results for input(s): "TSH", "T4TOTAL", "FREET4", "T3FREE", "THYROIDAB" in the last 72 hours. Anemia Panel: No results for input(s): "VITAMINB12", "FOLATE", "FERRITIN", "TIBC", "IRON", "RETICCTPCT" in the last 72 hours. Sepsis Labs: No results for input(s): "PROCALCITON", "LATICACIDVEN" in the last 168 hours.  No results found for this or any previous visit (from the past 240 hour(s)).        Radiology Studies: No results found.      Scheduled Meds:  (feeding supplement) PROSource Plus  30 mL Oral BID BM   vitamin B-12  500 mcg Oral Daily   enoxaparin (LOVENOX) injection  40 mg Subcutaneous Q24H   feeding supplement (KATE FARMS STANDARD 1.4)  325 mL Oral BID BM   haloperidol  1 mg Oral BID   hydrocerin   Topical BID   Continuous Infusions:   LOS: 173 days   Time spent= 35 mins    Natalie Vassar Arsenio Loader, MD [facility]  If 7PM-7AM, please contact night-coverage  06/17/2022, 10:48 AM

## 2022-06-18 NOTE — Plan of Care (Signed)
Patient AxOx0, reacts to her name. Patient ambulated independently multiple times on the unit. She had moments of agitation at times when encouraged to take nutrition supplements, refusing to take in afternoon. She ate self aprox 40% of lunch and 50% of dinner.

## 2022-06-18 NOTE — Progress Notes (Signed)
PROGRESS NOTE    Natalie Brown  [id number] DOB: [birth year] DOA: 12/24/2021 PCP: Marliss Coots, NP    Brief Narrative:   Natalie Brown is a 57 y.o. female with past medical history significant for bipolar affective disorder, chronic dementia with advanced cerebral atrophy, behavioral disturbance who was initially admitted to the behavioral health hospital on 09/07/2021 with aggressive behavior.  Patient was transferred to [facility] on 12/24/2021 for altered mental status with concern for catatonia.  Psychiatry and neurology evaluated patient during her hospitalization.    Patient is medically stable for discharge and awaiting placement at a memory care facility.  Assessment & Plan:   Acute metabolic encephalopathy in the setting of chronic progressive dementia Patient was initially admitted to behavioral health Hospital for aggressive behavior and transferred to [facility] for altered mental status with concern for catatonia.  Patient was seen by psychiatry and neurology during hospitalization with MRI and EEG unrevealing.  ANA and metal panel negative June 2023.  Mental status remains at baseline, in which she does not participate much in conversation but ambulates freely during today. --Continue close supervision, given history of unintentional elopement -- Pending placement, medically stable for discharge once facility identified  Bipolar disorder -- Haldol 1 mg p.o. twice daily -- Zyprexa 5 mg p.o. every 8 hours as needed agitation -- Hydroxyzine grams p.o. 3 times daily as needed anxiety -- Ativan 0.5 mg p.o. every 6 hours as needed anxiety/agitation  Essential hypertension Currently off antihypertensive therapy.  Blood pressure stable, 122/72.  CKD stage IIIb Stable, creatinine 1.17 on 06/16/2022.  Vitamin B12 deficiency Vitamin B12 500 mcg p.o. daily  Debility, deconditioning Medically stable for discharge, pending placement.  Further per  TOC.   DVT prophylaxis: enoxaparin (LOVENOX) injection 40 mg Start: 12/26/21 1200    Code Status: Full Code Family Communication: No family present at bedside this morning  Disposition Plan:  Level of care: Med-Surg Status is: Inpatient Remains inpatient appropriate because: Medically stable to discharge to memory care unit once bed identified, further per [facility]    Consultants:  Psychiatry Neurology  Procedures:  EEG  Antimicrobials:  None   Subjective: Patient seen examined bedside, resting calmly.  Lying in bed.  Moving around the bed.  Noncommunicative.  Appears in no distress.  No acute events overnight per nursing staff.  Medically stable for discharge once bed available per TOC.  Unable to obtain any further ROS due to underlying dementia.  Objective: Vitals:   06/17/22 1541 06/17/22 2000 06/18/22 0542 06/18/22 0722  BP: 128/88 125/85 (!) 135/95 122/72  Pulse: 98 96 98 95  Resp: '17 18 17 18  '$ Temp: 97.7 F (36.5 C) 97.9 F (36.6 C) 99.5 F (37.5 C) 98.7 F (37.1 C)  TempSrc:  Oral Oral Oral  SpO2: 100% 98% 99% 100%  Weight:      Height:        Intake/Output Summary (Last 24 hours) at 06/18/2022 1023 Last data filed at 06/17/2022 1408 Gross per 24 hour  Intake 120 ml  Output --  Net 120 ml   Filed Weights   02/03/22 2025  Weight: 72 kg    Examination:  Physical Exam: GEN: NAD, alert  HEENT: NCAT, PERRL, EOMI, sclera clear, MMM PULM: CTAB w/o wheezes/crackles, normal respiratory effort, on room air CV: RRR w/o M/G/R GI: abd soft, NTND, NABS MSK: no peripheral edema, moves all extremities independently    Data Reviewed: I have personally reviewed following labs and imaging  studies  CBC: Recent Labs  Lab 06/16/22 0459  WBC 5.7  HGB 13.5  HCT 39.3  MCV 86.0  PLT [id number]   Basic Metabolic Panel: Recent Labs  Lab 06/16/22 0459  NA 137  K 3.7  CL 103  CO2 26  GLUCOSE 106*  BUN 18  CREATININE 1.17*  CALCIUM 9.2   GFR: Estimated  Creatinine Clearance: 55.2 mL/min (A) (by C-G formula based on SCr of 1.17 mg/dL (H)). Liver Function Tests: No results for input(s): "AST", "ALT", "ALKPHOS", "BILITOT", "PROT", "ALBUMIN" in the last 168 hours. No results for input(s): "LIPASE", "AMYLASE" in the last 168 hours. No results for input(s): "AMMONIA" in the last 168 hours. Coagulation Profile: No results for input(s): "INR", "PROTIME" in the last 168 hours. Cardiac Enzymes: No results for input(s): "CKTOTAL", "CKMB", "CKMBINDEX", "TROPONINI" in the last 168 hours. BNP (last 3 results) No results for input(s): "PROBNP" in the last 8760 hours. HbA1C: No results for input(s): "HGBA1C" in the last 72 hours. CBG: No results for input(s): "GLUCAP" in the last 168 hours. Lipid Profile: No results for input(s): "CHOL", "HDL", "LDLCALC", "TRIG", "CHOLHDL", "LDLDIRECT" in the last 72 hours. Thyroid Function Tests: No results for input(s): "TSH", "T4TOTAL", "FREET4", "T3FREE", "THYROIDAB" in the last 72 hours. Anemia Panel: No results for input(s): "VITAMINB12", "FOLATE", "FERRITIN", "TIBC", "IRON", "RETICCTPCT" in the last 72 hours. Sepsis Labs: No results for input(s): "PROCALCITON", "LATICACIDVEN" in the last 168 hours.  No results found for this or any previous visit (from the past 240 hour(s)).       Radiology Studies: No results found.      Scheduled Meds:  (feeding supplement) PROSource Plus  30 mL Oral BID BM   vitamin B-12  500 mcg Oral Daily   enoxaparin (LOVENOX) injection  40 mg Subcutaneous Q24H   feeding supplement (KATE FARMS STANDARD 1.4)  325 mL Oral BID BM   haloperidol  1 mg Oral BID   hydrocerin   Topical BID   Continuous Infusions:   LOS: 174 days    Time spent: 46 minutes spent on chart review, discussion with nursing staff, consultants, updating family and interview/physical exam; more than 50% of that time was spent in counseling and/or coordination of care.    Jahmeir Geisen J [place] DO [facility] Available via Epic secure chat 7am-7pm After these hours, please refer to coverage provider listed on [email or web address] 06/18/2022, 10:23 AM

## 2022-06-18 NOTE — Progress Notes (Signed)
Mobility Specialist - Progress Note   06/18/22 0942  Mobility  Activity Ambulated independently in hallway  Level of Assistance Independent  Assistive Device None  Distance Ambulated (ft) 40 ft  Activity Response Tolerated well  $Mobility charge 1 Mobility   Pt received wandering hallway. No faults throughout. Pt left in bed with all needs met.  Franki Monte  Mobility Specialist Please contact via Solicitor or Rehab office at [phone]

## 2022-06-19 NOTE — Progress Notes (Signed)
PROGRESS NOTE    Natalie Brown  [id number] DOB: 1965/06/27 DOA: 12/24/2021 PCP: Marliss Coots, NP    Brief Narrative:   Natalie Brown is a 57 y.o. female with past medical history significant for bipolar affective disorder, chronic dementia with advanced cerebral atrophy, behavioral disturbance who was initially admitted to the behavioral health hospital on 09/07/2021 with aggressive behavior.  Patient was transferred to [facility] on 12/24/2021 for altered mental status with concern for catatonia.  Psychiatry and neurology evaluated patient during her hospitalization.    Patient is medically stable for discharge and awaiting placement at a memory care facility.  Assessment & Plan:   Acute metabolic encephalopathy in the setting of chronic progressive dementia Patient was initially admitted to behavioral health Hospital for aggressive behavior and transferred to [facility] for altered mental status with concern for catatonia.  Patient was seen by psychiatry and neurology during hospitalization with MRI and EEG unrevealing.  ANA and metal panel negative June 2023.  Mental status remains at baseline, in which she does not participate much in conversation but ambulates freely during today. -- Continue close supervision, given history of unintentional elopement -- Pending placement, medically stable for discharge once facility identified  Bipolar disorder -- Haldol 1 mg p.o. twice daily -- Zyprexa 5 mg p.o. every 8 hours as needed agitation -- Hydroxyzine grams p.o. 3 times daily as needed anxiety -- Ativan 0.5 mg p.o. every 6 hours as needed anxiety/agitation  Essential hypertension Currently off antihypertensive therapy.  Blood pressure stable, 122/72.  CKD stage IIIb Stable, creatinine 1.17 on 06/16/2022.  Vitamin B12 deficiency Vitamin B12 500 mcg p.o. daily  Debility, deconditioning Medically stable for discharge, pending placement.  Further per  TOC.   DVT prophylaxis: enoxaparin (LOVENOX) injection 40 mg Start: 12/26/21 1200    Code Status: Full Code Family Communication: No family present at bedside this morning  Disposition Plan:  Level of care: Med-Surg Status is: Inpatient Remains inpatient appropriate because: Medically stable to discharge to memory care unit once bed identified, further per [facility]    Consultants:  Psychiatry Neurology  Procedures:  EEG  Antimicrobials:  None   Subjective: Patient seen examined bedside, resting calmly.  Lying in bed.  Sleeping but arousable.  Remains non-communicative.  Appears in no distress.  No acute events overnight per nursing staff.  Medically stable for discharge once bed available per TOC.  Unable to obtain any further ROS due to underlying dementia.  Objective: Vitals:   06/18/22 1531 06/18/22 1940 06/19/22 0451 06/19/22 1029  BP: (!) 158/85 122/75 138/76   Pulse: 99 92 96 (!) 106  Resp: '18 16 18 18  '$ Temp: 98.7 F (37.1 C) 98.5 F (36.9 C) 98.7 F (37.1 C) 97.6 F (36.4 C)  TempSrc: Oral Oral Oral Oral  SpO2: 99% 100% 100% 100%  Weight:      Height:        Intake/Output Summary (Last 24 hours) at 06/19/2022 1059 Last data filed at 06/18/2022 1315 Gross per 24 hour  Intake 150 ml  Output --  Net 150 ml   Filed Weights   02/03/22 2025  Weight: 72 kg    Examination:  Physical Exam: GEN: NAD, alert  HEENT: NCAT, PERRL, EOMI, sclera clear, MMM PULM: CTAB w/o wheezes/crackles, normal respiratory effort, on room air CV: RRR w/o M/G/R GI: abd soft, NTND, NABS MSK: no peripheral edema, moves all extremities independently    Data Reviewed: I have personally reviewed following labs  and imaging studies  CBC: Recent Labs  Lab 06/16/22 0459  WBC 5.7  HGB 13.5  HCT 39.3  MCV 86.0  PLT [id number]   Basic Metabolic Panel: Recent Labs  Lab 06/16/22 0459  NA 137  K 3.7  CL 103  CO2 26  GLUCOSE 106*  BUN 18  CREATININE 1.17*  CALCIUM 9.2    GFR: Estimated Creatinine Clearance: 55.2 mL/min (A) (by C-G formula based on SCr of 1.17 mg/dL (H)). Liver Function Tests: No results for input(s): "AST", "ALT", "ALKPHOS", "BILITOT", "PROT", "ALBUMIN" in the last 168 hours. No results for input(s): "LIPASE", "AMYLASE" in the last 168 hours. No results for input(s): "AMMONIA" in the last 168 hours. Coagulation Profile: No results for input(s): "INR", "PROTIME" in the last 168 hours. Cardiac Enzymes: No results for input(s): "CKTOTAL", "CKMB", "CKMBINDEX", "TROPONINI" in the last 168 hours. BNP (last 3 results) No results for input(s): "PROBNP" in the last 8760 hours. HbA1C: No results for input(s): "HGBA1C" in the last 72 hours. CBG: No results for input(s): "GLUCAP" in the last 168 hours. Lipid Profile: No results for input(s): "CHOL", "HDL", "LDLCALC", "TRIG", "CHOLHDL", "LDLDIRECT" in the last 72 hours. Thyroid Function Tests: No results for input(s): "TSH", "T4TOTAL", "FREET4", "T3FREE", "THYROIDAB" in the last 72 hours. Anemia Panel: No results for input(s): "VITAMINB12", "FOLATE", "FERRITIN", "TIBC", "IRON", "RETICCTPCT" in the last 72 hours. Sepsis Labs: No results for input(s): "PROCALCITON", "LATICACIDVEN" in the last 168 hours.  No results found for this or any previous visit (from the past 240 hour(s)).       Radiology Studies: No results found.      Scheduled Meds:  (feeding supplement) PROSource Plus  30 mL Oral BID BM   vitamin B-12  500 mcg Oral Daily   enoxaparin (LOVENOX) injection  40 mg Subcutaneous Q24H   feeding supplement (KATE FARMS STANDARD 1.4)  325 mL Oral BID BM   haloperidol  1 mg Oral BID   hydrocerin   Topical BID   Continuous Infusions:   LOS: 175 days    Time spent: 46 minutes spent on chart review, discussion with nursing staff, consultants, updating family and interview/physical exam; more than 50% of that time was spent in counseling and/or coordination of care.    Hardie Veltre J  [place] DO [facility] Available via Epic secure chat 7am-7pm After these hours, please refer to coverage provider listed on [email or web address] 06/19/2022, 10:59 AM

## 2022-06-20 NOTE — Progress Notes (Signed)
PROGRESS NOTE    Natalie Brown  [id number] DOB: [birth year] DOA: 12/24/2021 PCP: Marliss Coots, NP    Brief Narrative:   Natalie Brown is a 57 y.o. female with past medical history significant for bipolar affective disorder, chronic dementia with advanced cerebral atrophy, behavioral disturbance who was initially admitted to the behavioral health hospital on 09/07/2021 with aggressive behavior.  Patient was transferred to [facility] on 12/24/2021 for altered mental status with concern for catatonia.  Psychiatry and neurology evaluated patient during her hospitalization.    Patient is medically stable for discharge and awaiting placement at a memory care facility.  Assessment & Plan:   Acute metabolic encephalopathy in the setting of chronic progressive dementia Patient was initially admitted to behavioral health Hospital for aggressive behavior and transferred to [facility] for altered mental status with concern for catatonia.  Patient was seen by psychiatry and neurology during hospitalization with MRI and EEG unrevealing.  ANA and metal panel negative June 2023.  Mental status remains at baseline, in which she does not participate much in conversation but ambulates freely during today. -- Continue close supervision, given history of unintentional elopement -- Pending placement, medically stable for discharge once facility identified  Bipolar disorder -- Haldol 1 mg p.o. twice daily -- Zyprexa 5 mg p.o. every 8 hours as needed agitation -- Hydroxyzine grams p.o. 3 times daily as needed anxiety -- Ativan 0.5 mg p.o. every 6 hours as needed anxiety/agitation  Essential hypertension Currently off antihypertensive therapy.  Blood pressure stable, 122/72.  CKD stage IIIb Stable, creatinine 1.17 on 06/16/2022.  Vitamin B12 deficiency Vitamin B12 500 mcg p.o. daily  Debility, deconditioning Medically stable for discharge, pending placement.  Further per  TOC.   DVT prophylaxis: enoxaparin (LOVENOX) injection 40 mg Start: 12/26/21 1200    Code Status: Full Code Family Communication: No family present at bedside this morning  Disposition Plan:  Level of care: Med-Surg Status is: Inpatient Remains inpatient appropriate because: Medically stable to discharge to memory care unit once bed identified, further per [facility]    Consultants:  Psychiatry Neurology  Procedures:  EEG  Antimicrobials:  None   Subjective: Patient seen examined bedside, resting calmly.  Lying in bed.  Alert but remains noncommunicative.  Appears in no distress.  RN present at bedside.  No acute events overnight. Medically stable for discharge once bed available per TOC.  Unable to obtain any further ROS due to underlying dementia.  Objective: Vitals:   06/19/22 1029 06/19/22 1730 06/20/22 0616 06/20/22 0741  BP:  108/70 (!) 137/95 97/82  Pulse: (!) 106 66 87 87  Resp: '18 18 17 18  '$ Temp: 97.6 F (36.4 C) 98.1 F (36.7 C) 98.6 F (37 C) 98.6 F (37 C)  TempSrc: Oral Axillary  Oral  SpO2: 100% 100% 100% 100%  Weight:      Height:        Intake/Output Summary (Last 24 hours) at 06/20/2022 1039 Last data filed at 06/19/2022 1230 Gross per 24 hour  Intake 240 ml  Output --  Net 240 ml   Filed Weights   02/03/22 2025  Weight: 72 kg    Examination:  Physical Exam: GEN: NAD, alert  HEENT: NCAT, PERRL, EOMI, sclera clear, MMM PULM: CTAB w/o wheezes/crackles, normal respiratory effort, on room air CV: RRR w/o M/G/R GI: abd soft, NTND, NABS MSK: no peripheral edema, moves all extremities independently    Data Reviewed: I have personally reviewed following labs and  imaging studies  CBC: Recent Labs  Lab 06/16/22 0459  WBC 5.7  HGB 13.5  HCT 39.3  MCV 86.0  PLT [id number]   Basic Metabolic Panel: Recent Labs  Lab 06/16/22 0459  NA 137  K 3.7  CL 103  CO2 26  GLUCOSE 106*  BUN 18  CREATININE 1.17*  CALCIUM 9.2   GFR: Estimated  Creatinine Clearance: 55.2 mL/min (A) (by C-G formula based on SCr of 1.17 mg/dL (H)). Liver Function Tests: No results for input(s): "AST", "ALT", "ALKPHOS", "BILITOT", "PROT", "ALBUMIN" in the last 168 hours. No results for input(s): "LIPASE", "AMYLASE" in the last 168 hours. No results for input(s): "AMMONIA" in the last 168 hours. Coagulation Profile: No results for input(s): "INR", "PROTIME" in the last 168 hours. Cardiac Enzymes: No results for input(s): "CKTOTAL", "CKMB", "CKMBINDEX", "TROPONINI" in the last 168 hours. BNP (last 3 results) No results for input(s): "PROBNP" in the last 8760 hours. HbA1C: No results for input(s): "HGBA1C" in the last 72 hours. CBG: No results for input(s): "GLUCAP" in the last 168 hours. Lipid Profile: No results for input(s): "CHOL", "HDL", "LDLCALC", "TRIG", "CHOLHDL", "LDLDIRECT" in the last 72 hours. Thyroid Function Tests: No results for input(s): "TSH", "T4TOTAL", "FREET4", "T3FREE", "THYROIDAB" in the last 72 hours. Anemia Panel: No results for input(s): "VITAMINB12", "FOLATE", "FERRITIN", "TIBC", "IRON", "RETICCTPCT" in the last 72 hours. Sepsis Labs: No results for input(s): "PROCALCITON", "LATICACIDVEN" in the last 168 hours.  No results found for this or any previous visit (from the past 240 hour(s)).       Radiology Studies: No results found.      Scheduled Meds:  (feeding supplement) PROSource Plus  30 mL Oral BID BM   vitamin B-12  500 mcg Oral Daily   enoxaparin (LOVENOX) injection  40 mg Subcutaneous Q24H   feeding supplement (KATE FARMS STANDARD 1.4)  325 mL Oral BID BM   haloperidol  1 mg Oral BID   hydrocerin   Topical BID   Continuous Infusions:   LOS: 176 days    Time spent: 46 minutes spent on chart review, discussion with nursing staff, consultants, updating family and interview/physical exam; more than 50% of that time was spent in counseling and/or coordination of care.    Raja Caputi J [place] DO [facility] Available via Epic secure chat 7am-7pm After these hours, please refer to coverage provider listed on [email or web address] 06/20/2022, 10:39 AM

## 2022-06-21 NOTE — Progress Notes (Signed)
PROGRESS NOTE    Natalie Brown  [id number] DOB: 05/18/1965 DOA: 12/24/2021 PCP: Marliss Coots, NP    Brief Narrative:   Natalie Brown is a 57 y.o. female with past medical history significant for bipolar affective disorder, chronic dementia with advanced cerebral atrophy, behavioral disturbance who was initially admitted to the behavioral health hospital on 09/07/2021 with aggressive behavior.  Patient was transferred to [facility] on 12/24/2021 for altered mental status with concern for catatonia.  Psychiatry and neurology evaluated patient during her hospitalization.    Patient is medically stable for discharge and awaiting placement at a memory care facility.  Assessment & Plan:   Acute metabolic encephalopathy in the setting of chronic progressive dementia Patient was initially admitted to behavioral health Hospital for aggressive behavior and transferred to [facility] for altered mental status with concern for catatonia.  Patient was seen by psychiatry and neurology during hospitalization with MRI and EEG unrevealing.  ANA and metal panel negative June 2023.  Mental status remains at baseline, in which she does not participate much in conversation but ambulates freely during today. -- Continue close supervision, given history of unintentional elopement -- Pending placement, medically stable for discharge once facility identified  Bipolar disorder -- Haldol 1 mg p.o. twice daily -- Zyprexa 5 mg p.o. every 8 hours as needed agitation -- Hydroxyzine grams p.o. 3 times daily as needed anxiety -- Ativan 0.5 mg p.o. every 6 hours as needed anxiety/agitation  Essential hypertension Currently off antihypertensive therapy.  Blood pressure stable, 122/72.  CKD stage IIIb Stable, creatinine 1.17 on 06/16/2022.  Vitamin B12 deficiency Vitamin B12 500 mcg p.o. daily  Debility, deconditioning Medically stable for discharge, pending placement.  Further per  TOC.   DVT prophylaxis: enoxaparin (LOVENOX) injection 40 mg Start: 12/26/21 1200    Code Status: Full Code Family Communication: No family present at bedside this morning  Disposition Plan:  Level of care: Med-Surg Status is: Inpatient Remains inpatient appropriate because: Medically stable to discharge to memory care unit once bed identified, further per [facility]    Consultants:  Psychiatry Neurology  Procedures:  EEG  Antimicrobials:  None   Subjective: Patient seen examined bedside, resting calmly.  Lying in bed.  Alert but remains noncommunicative.  Appears in no distress.  No acute events overnight. Medically stable for discharge once bed available per TOC.  Unable to obtain any further ROS due to underlying dementia.  Objective: Vitals:   06/20/22 0741 06/20/22 2122 06/21/22 0412 06/21/22 0854  BP: 97/82 (!) 121/110 110/74 105/72  Pulse: 87 (!) 104 (!) 103 91  Resp: '18 17 16 18  '$ Temp: 98.6 F (37 C) 98.7 F (37.1 C)  (!) 97.5 F (36.4 C)  TempSrc: Oral Oral  Oral  SpO2: 100% 100% 100% 100%  Weight:      Height:        Intake/Output Summary (Last 24 hours) at 06/21/2022 [id number] Last data filed at 06/20/2022 2103 Gross per 24 hour  Intake 480 ml  Output --  Net 480 ml   Filed Weights   02/03/22 2025  Weight: 72 kg    Examination:  Physical Exam: GEN: NAD, alert  HEENT: NCAT, PERRL, EOMI, sclera clear, MMM PULM: CTAB w/o wheezes/crackles, normal respiratory effort, on room air CV: RRR w/o M/G/R GI: abd soft, NTND, NABS MSK: no peripheral edema, moves all extremities independently    Data Reviewed: I have personally reviewed following labs and imaging studies  CBC: Recent Labs  Lab 06/16/22 0459  WBC 5.7  HGB 13.5  HCT 39.3  MCV 86.0  PLT [id number]   Basic Metabolic Panel: Recent Labs  Lab 06/16/22 0459  NA 137  K 3.7  CL 103  CO2 26  GLUCOSE 106*  BUN 18  CREATININE 1.17*  CALCIUM 9.2   GFR: Estimated Creatinine Clearance: 55.2 mL/min  (A) (by C-G formula based on SCr of 1.17 mg/dL (H)). Liver Function Tests: No results for input(s): "AST", "ALT", "ALKPHOS", "BILITOT", "PROT", "ALBUMIN" in the last 168 hours. No results for input(s): "LIPASE", "AMYLASE" in the last 168 hours. No results for input(s): "AMMONIA" in the last 168 hours. Coagulation Profile: No results for input(s): "INR", "PROTIME" in the last 168 hours. Cardiac Enzymes: No results for input(s): "CKTOTAL", "CKMB", "CKMBINDEX", "TROPONINI" in the last 168 hours. BNP (last 3 results) No results for input(s): "PROBNP" in the last 8760 hours. HbA1C: No results for input(s): "HGBA1C" in the last 72 hours. CBG: No results for input(s): "GLUCAP" in the last 168 hours. Lipid Profile: No results for input(s): "CHOL", "HDL", "LDLCALC", "TRIG", "CHOLHDL", "LDLDIRECT" in the last 72 hours. Thyroid Function Tests: No results for input(s): "TSH", "T4TOTAL", "FREET4", "T3FREE", "THYROIDAB" in the last 72 hours. Anemia Panel: No results for input(s): "VITAMINB12", "FOLATE", "FERRITIN", "TIBC", "IRON", "RETICCTPCT" in the last 72 hours. Sepsis Labs: No results for input(s): "PROCALCITON", "LATICACIDVEN" in the last 168 hours.  No results found for this or any previous visit (from the past 240 hour(s)).       Radiology Studies: No results found.      Scheduled Meds:  (feeding supplement) PROSource Plus  30 mL Oral BID BM   vitamin B-12  500 mcg Oral Daily   enoxaparin (LOVENOX) injection  40 mg Subcutaneous Q24H   feeding supplement (KATE FARMS STANDARD 1.4)  325 mL Oral BID BM   haloperidol  1 mg Oral BID   hydrocerin   Topical BID   Continuous Infusions:   LOS: 177 days    Time spent: 43 minutes spent on chart review, discussion with nursing staff, consultants, updating family and interview/physical exam; more than 50% of that time was spent in counseling and/or coordination of care.    Evon Lopezperez J [place] DO [facility] Available via Epic  secure chat 7am-7pm After these hours, please refer to coverage provider listed on [email or web address] 06/21/2022, 9:58 AM

## 2022-06-21 NOTE — Plan of Care (Signed)

## 2022-06-21 NOTE — Progress Notes (Signed)
Mobility Specialist - Progress Note   06/21/22 1357  Mobility  Activity Ambulated with assistance in room  Level of Assistance Independent  Assistive Device None  Distance Ambulated (ft) 10 ft  Activity Response Tolerated well  $Mobility charge 1 Mobility   Pt received setting off bed alarm. No faults throughout. Pt returned to bed with all needs met and bed alarm on.  Franki Monte  Mobility Specialist Please contact via Solicitor or Rehab office at [phone]

## 2022-06-22 MED ORDER — HALOPERIDOL LACTATE 5 MG/ML IJ SOLN
1.0000 mg | Freq: Once | INTRAMUSCULAR | Status: AC
  Administered 2022-06-22: 1 mg via INTRAMUSCULAR
  Filled 2022-06-22: qty 1

## 2022-06-22 NOTE — Progress Notes (Signed)
PROGRESS NOTE    Natalie Brown  [id number] DOB: 1965-12-30 DOA: 12/24/2021 PCP: Marliss Coots, NP    Brief Narrative:   Natalie Brown is a 57 y.o. female with past medical history significant for bipolar affective disorder, chronic dementia with advanced cerebral atrophy, behavioral disturbance who was initially admitted to the behavioral health hospital on 09/07/2021 with aggressive behavior.  Patient was transferred to [facility] on 12/24/2021 for altered mental status with concern for catatonia.  Psychiatry and neurology evaluated patient during her hospitalization.    Patient is medically stable for discharge and awaiting placement at a memory care facility.  Assessment & Plan:   Acute metabolic encephalopathy in the setting of chronic progressive dementia Patient was initially admitted to behavioral health Hospital for aggressive behavior and transferred to [facility] for altered mental status with concern for catatonia.  Patient was seen by psychiatry and neurology during hospitalization with MRI and EEG unrevealing.  ANA and metal panel negative June 2023.  Mental status remains at baseline, in which she does not participate much in conversation but ambulates freely during today. -- Continue close supervision, given history of unintentional elopement -- Pending placement, medically stable for discharge once facility identified  Bipolar disorder -- Haldol 1 mg p.o. twice daily -- Zyprexa 5 mg p.o. every 8 hours as needed agitation -- Hydroxyzine grams p.o. 3 times daily as needed anxiety -- Ativan 0.5 mg p.o. every 6 hours as needed anxiety/agitation  Essential hypertension Currently off antihypertensive therapy.  Blood pressure stable, 122/72.  CKD stage IIIb Stable, creatinine 1.17 on 06/16/2022.  Vitamin B12 deficiency Vitamin B12 500 mcg p.o. daily  Debility, deconditioning Medically stable for discharge, pending placement.  Further per  TOC.   DVT prophylaxis: enoxaparin (LOVENOX) injection 40 mg Start: 12/26/21 1200    Code Status: Full Code Family Communication: No family present at bedside this morning  Disposition Plan:  Level of care: Med-Surg Status is: Inpatient Remains inpatient appropriate because: Medically stable to discharge to memory care unit once bed identified, further per [facility]    Consultants:  Psychiatry Neurology  Procedures:  EEG  Antimicrobials:  None   Subjective: Patient seen examined bedside, resting calmly.  Lying in bed.  Alert but remains noncommunicative.  Appears in no distress.  No acute events overnight. Medically stable for discharge once bed available per TOC.  Unable to obtain any further ROS due to underlying dementia.  Objective: Vitals:   06/21/22 0412 06/21/22 0854 06/21/22 1948 06/22/22 0756  BP: 110/74 105/72 (!) 88/58 114/68  Pulse: (!) 103 91 (!) 57 80  Resp: '16 18 16 18  '$ Temp:  (!) 97.5 F (36.4 C) 97.6 F (36.4 C) 98.3 F (36.8 C)  TempSrc:  Oral Oral Oral  SpO2: 100% 100% 99% 96%  Weight:      Height:       No intake or output data in the 24 hours ending 06/22/22 [id number]  Filed Weights   02/03/22 2025  Weight: 72 kg    Examination:  Physical Exam: GEN: NAD, alert  HEENT: NCAT, PERRL, EOMI, sclera clear, MMM PULM: CTAB w/o wheezes/crackles, normal respiratory effort, on room air CV: RRR w/o M/G/R GI: abd soft, NTND, NABS MSK: no peripheral edema, moves all extremities independently    Data Reviewed: I have personally reviewed following labs and imaging studies  CBC: Recent Labs  Lab 06/16/22 0459  WBC 5.7  HGB 13.5  HCT 39.3  MCV 86.0  PLT 295  Basic Metabolic Panel: Recent Labs  Lab 06/16/22 0459  NA 137  K 3.7  CL 103  CO2 26  GLUCOSE 106*  BUN 18  CREATININE 1.17*  CALCIUM 9.2   GFR: Estimated Creatinine Clearance: 55.2 mL/min (A) (by C-G formula based on SCr of 1.17 mg/dL (H)). Liver Function Tests: No results for  input(s): "AST", "ALT", "ALKPHOS", "BILITOT", "PROT", "ALBUMIN" in the last 168 hours. No results for input(s): "LIPASE", "AMYLASE" in the last 168 hours. No results for input(s): "AMMONIA" in the last 168 hours. Coagulation Profile: No results for input(s): "INR", "PROTIME" in the last 168 hours. Cardiac Enzymes: No results for input(s): "CKTOTAL", "CKMB", "CKMBINDEX", "TROPONINI" in the last 168 hours. BNP (last 3 results) No results for input(s): "PROBNP" in the last 8760 hours. HbA1C: No results for input(s): "HGBA1C" in the last 72 hours. CBG: No results for input(s): "GLUCAP" in the last 168 hours. Lipid Profile: No results for input(s): "CHOL", "HDL", "LDLCALC", "TRIG", "CHOLHDL", "LDLDIRECT" in the last 72 hours. Thyroid Function Tests: No results for input(s): "TSH", "T4TOTAL", "FREET4", "T3FREE", "THYROIDAB" in the last 72 hours. Anemia Panel: No results for input(s): "VITAMINB12", "FOLATE", "FERRITIN", "TIBC", "IRON", "RETICCTPCT" in the last 72 hours. Sepsis Labs: No results for input(s): "PROCALCITON", "LATICACIDVEN" in the last 168 hours.  No results found for this or any previous visit (from the past 240 hour(s)).       Radiology Studies: No results found.      Scheduled Meds:  (feeding supplement) PROSource Plus  30 mL Oral BID BM   vitamin B-12  500 mcg Oral Daily   enoxaparin (LOVENOX) injection  40 mg Subcutaneous Q24H   feeding supplement (KATE FARMS STANDARD 1.4)  325 mL Oral BID BM   haloperidol  1 mg Oral BID   hydrocerin   Topical BID   Continuous Infusions:   LOS: 178 days    Time spent: 39 minutes spent on chart review, discussion with nursing staff, consultants, updating family and interview/physical exam; more than 50% of that time was spent in counseling and/or coordination of care.    Natalie Brown J [place] DO [facility] Available via Epic secure chat 7am-7pm After these hours, please refer to coverage provider listed on  [email or web address] 06/22/2022, 9:22 AM

## 2022-06-22 NOTE — Progress Notes (Addendum)
Patient has increased agitation appears to be anxious in behavior picking things up and moving them around, pt given Atarax per PRN order

## 2022-06-22 NOTE — Progress Notes (Signed)
Patient is getting out of bed, patient not easily redirectable or cooperating,pt needs to be cleaned but resisting, pt received prn ativan for agitation.

## 2022-06-22 NOTE — Progress Notes (Signed)
Patient has become increasingly agitated, keeps getting up out of bed, walking down hall way, not easily redirectable. Was given PRN ativan for increased agitation, restless behavior.

## 2022-06-23 MED ORDER — ZIPRASIDONE MESYLATE 20 MG IM SOLR
20.0000 mg | Freq: Once | INTRAMUSCULAR | Status: AC
  Administered 2022-06-23: 20 mg via INTRAMUSCULAR
  Filled 2022-06-23: qty 20

## 2022-06-23 MED ORDER — HALOPERIDOL LACTATE 5 MG/ML IJ SOLN
5.0000 mg | Freq: Once | INTRAMUSCULAR | Status: AC
  Administered 2022-06-23: 5 mg via INTRAMUSCULAR
  Filled 2022-06-23: qty 1

## 2022-06-23 MED ORDER — HALOPERIDOL 1 MG PO TABS
2.0000 mg | ORAL_TABLET | Freq: Two times a day (BID) | ORAL | Status: DC
  Administered 2022-06-23 – 2022-11-04 (×228): 2 mg via ORAL
  Filled 2022-06-23 (×258): qty 2

## 2022-06-23 NOTE — Progress Notes (Addendum)
Patient Gretzinger's behavior is increasingly more agitated over the course of PM shift on 06/22/2022. Patient Serfass is a high fall and elopement risk.  Primary nurse and multiple staff members were needed for patient safety and redirection.  Scheduled and PRN agitation medications did not decrease agitated behaviors throughout shift.  Saturday, June 22, 2022  19:46 Contacted Dr. Nevada Crane regarding changing the route of 2200 Haldol dose to IM.  This request was based on SBAR report from dayshift RN regarding multiple episodes of agitation noted during the day and difficulty with administering medication once Patient Mercil is in an agitated state.    2102 Haldol, IM administered. Atarax administered.  2200--0000 Patient Rzepka slept for 45 minutes.  Multiple attempts of getting out of bed.  Able to be redirected by Sherron Flemings, RN and [employer].   Ativan administered.  0000-0200 Fifi Schindler, RN and multiple staff members took shifts monitoring Patient Stanislawski.  Patient Machovec walked with staff around the unit and then escorted back to room.  Continued attempts by Patient Goldbach to get out of bed and leave her room.  0200--0330 Multiple attempts made by Patient Lehto to leave room.  Liannah Yarbough, RN and multiple staff members needed to keep Patient Hee from leaving her room.  0305--0400 Multiple staff members needed to keep Patient Cauley from leaving room. Zyprexa administered.  [facility] texted Dr. Nevada Crane to come and see Patient Giuffre.  [phone] Reported by staff to [facility], RN that Dr. Nevada Crane came to see Patient Accomando.    0530 Continued attempts made by Patient Speziale to leave room.

## 2022-06-23 NOTE — Progress Notes (Signed)
PROGRESS NOTE    Natalie Brown  [id number] DOB: [birth year] DOA: 12/24/2021 PCP: Marliss Coots, NP    Brief Narrative:   Natalie Brown is a 57 y.o. female with past medical history significant for bipolar affective disorder, chronic dementia with advanced cerebral atrophy, behavioral disturbance who was initially admitted to the behavioral health hospital on 09/07/2021 with aggressive behavior.  Patient was transferred to [facility] on 12/24/2021 for altered mental status with concern for catatonia.  Psychiatry and neurology evaluated patient during her hospitalization.    Patient is medically stable for discharge and awaiting placement at a memory care facility.  Assessment & Plan:   Acute metabolic encephalopathy in the setting of chronic progressive dementia Patient was initially admitted to behavioral health Hospital for aggressive behavior and transferred to [facility] for altered mental status with concern for catatonia.  Patient was seen by psychiatry and neurology during hospitalization with MRI and EEG unrevealing.  ANA and metal panel negative June 2023.  Mental status remains at baseline, in which she does not participate much in conversation but ambulates freely during today. -- Continue close supervision, given history of unintentional elopement -- Pending placement, medically stable for discharge once facility identified  Bipolar disorder -- Increase Haldol to 2 mg p.o. twice daily -- Geodon '20mg'$  IM x 1 today -- Zyprexa 5 mg p.o. every 8 hours as needed agitation -- Hydroxyzine '25mg'$  PO TID PRN anxiety -- Ativan 0.5 mg p.o. every 6 hours as needed anxiety/agitation  Essential hypertension Currently off antihypertensive therapy.  Blood pressure stable, 122/72.  CKD stage IIIb Stable, creatinine 1.17 on 06/16/2022.  Vitamin B12 deficiency Vitamin B12 500 mcg p.o. daily  Debility, deconditioning Medically stable for discharge, pending placement.   Further per TOC.   DVT prophylaxis: enoxaparin (LOVENOX) injection 40 mg Start: 12/26/21 1200    Code Status: Full Code Family Communication: No family present at bedside this morning  Disposition Plan:  Level of care: Med-Surg Status is: Inpatient Remains inpatient appropriate because: Medically stable to discharge to memory care unit once bed identified, further per [facility]    Consultants:  Psychiatry Neurology  Procedures:  EEG  Antimicrobials:  None   Subjective: Patient seen examined bedside, walking around the room with toiletry items.  2 staff members present.  More agitated this morning.  Continues to be noncommunicative.  Overnight, nurse Lexine Baton reports increased agitation during her shift.  Patient's with multiple attempts to leave the room and was administered IM Haldol, Zyprexa, and Ativan with minimal effect.  Nurse Lexine Baton page overnight covering physician Dr. Nevada Crane for bedside evaluation; and on her arrival patient was noted to be sleeping in the bed.  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  Discussed with her and her daytime RN that we will be adjusting her scheduled Haldol and giving one-time dose of Geodon done today.  Will continue to assess further adjustment needs based on her continued agitation during the day. Medically stable for discharge once bed available per TOC.  Unable to obtain any further ROS due to underlying dementia.  Objective: Vitals:   06/22/22 0756 06/22/22 1540 06/22/22 1954 06/23/22 0643  BP: 114/68 101/63 (!) 97/55 107/83  Pulse: 80 78 79 (!) 123  Resp: '18 18 17 17  '$ Temp: 98.3 F (36.8 C) 98.5 F (36.9 C) 98.2 F (  36.8 C) 98.2 F (36.8 C)  TempSrc: Oral Oral Oral Oral  SpO2: 96% 100% 100% 100%  Weight:      Height:       No intake or output data in the 24 hours ending 06/23/22  0859  Filed Weights   02/03/22 2025  Weight: 72 kg    Examination:  Physical Exam: GEN: NAD, alert, agitated walking around the room HEENT: NCAT, PERRL, EOMI, sclera clear, MMM PULM: CTAB w/o wheezes/crackles, normal respiratory effort, on room air CV: RRR w/o M/G/R GI: abd soft, NTND, NABS MSK: no peripheral edema, moves all extremities independently    Data Reviewed: I have personally reviewed following labs and imaging studies  CBC: No results for input(s): "WBC", "NEUTROABS", "HGB", "HCT", "MCV", "PLT" in the last 168 hours.  Basic Metabolic Panel: No results for input(s): "NA", "K", "CL", "CO2", "GLUCOSE", "BUN", "CREATININE", "CALCIUM", "MG", "PHOS" in the last 168 hours.  GFR: Estimated Creatinine Clearance: 55.2 mL/min (A) (by C-G formula based on SCr of 1.17 mg/dL (H)). Liver Function Tests: No results for input(s): "AST", "ALT", "ALKPHOS", "BILITOT", "PROT", "ALBUMIN" in the last 168 hours. No results for input(s): "LIPASE", "AMYLASE" in the last 168 hours. No results for input(s): "AMMONIA" in the last 168 hours. Coagulation Profile: No results for input(s): "INR", "PROTIME" in the last 168 hours. Cardiac Enzymes: No results for input(s): "CKTOTAL", "CKMB", "CKMBINDEX", "TROPONINI" in the last 168 hours. BNP (last 3 results) No results for input(s): "PROBNP" in the last 8760 hours. HbA1C: No results for input(s): "HGBA1C" in the last 72 hours. CBG: No results for input(s): "GLUCAP" in the last 168 hours. Lipid Profile: No results for input(s): "CHOL", "HDL", "LDLCALC", "TRIG", "CHOLHDL", "LDLDIRECT" in the last 72 hours. Thyroid Function Tests: No results for input(s): "TSH", "T4TOTAL", "FREET4", "T3FREE", "THYROIDAB" in the last 72 hours. Anemia Panel: No results for input(s): "VITAMINB12", "FOLATE", "FERRITIN", "TIBC", "IRON", "RETICCTPCT" in the last 72 hours. Sepsis Labs: No results for input(s): "PROCALCITON", "LATICACIDVEN" in the last 168  hours.  No results found for this or any previous visit (from the past 240 hour(s)).       Radiology Studies: No results found.      Scheduled Meds:  (feeding supplement) PROSource Plus  30 mL Oral BID BM   vitamin B-12  500 mcg Oral Daily   enoxaparin (LOVENOX) injection  40 mg Subcutaneous Q24H   feeding supplement (KATE FARMS STANDARD 1.4)  325 mL Oral BID BM   haloperidol  2 mg Oral BID   hydrocerin   Topical BID   Continuous Infusions:   LOS: 179 days    Time spent: 42 minutes spent on chart review, discussion with nursing staff, consultants, updating family and interview/physical exam; more than 50% of that time was spent in counseling and/or coordination of care.    Laquan Beier J [place] DO [facility] Available via Epic secure chat 7am-7pm After these hours, please refer to coverage provider listed on [email or web address] 06/23/2022, 8:59 AM

## 2022-06-23 NOTE — Progress Notes (Signed)
Today Natalie Brown has been very agitated today. This AM she was given Ativan by my charge nurse during shift change because she was trying to go into other patients room and wander the hall. After I finished getting report on my other patients, Natalie Brown was still trying to exit her room so the secretary sat with her until I was done. The MD placed Geodon 1x dose but I had to wait on pharmacy to send. We contacted the staffing office and they did not have anyone to sit with her at the moment. During the time the secretary left out of the room to walk to the front desk after Natalie Brown calmed down, she went out our exit door down the stairs. We got her back onto the unit and placed her in the room. She continued to walk around her room agitated grabbing and pulling on staff until her medication was given. After the dose of Geodon she slept for maybe 30-36mn. A sitter came at 945am and AShirlwas grabbing, kicking, and pulling in her. She was not cooperative or following any commands. I gave her scheduled haldol. Nursing administration came by around 1145am after we gave zyrexa prn. They suggested for restraints for patient. I messaged the MD to update him on the activity for the AM. He placed orders for restraints. Natalie Brown's husband Natalie Proudcame in around 1pm. I waited on the restraints to see if she would calm down with him. She was still agitated and tried to leave the unit again. Staff helped me restrain her with soft wrist restraints and lap belt at 250pm. Husband was upset and crying but I let him know I tried everything and restraints was my last option. I let the MD know and he placed 1x order for IM haldol. Husband Natalie Proudtold me not to give to her at the moment to let her rest in the restraints. ABrieonahas been in the bed calm looking at her wrists. I will continue to monitor patient.

## 2022-06-23 NOTE — Progress Notes (Signed)
Received a call from the patient's RN, Susa Day, to present at bedside to assess an agitated patient.  Presented at bedside to see Ms. Case.  No sign of acute distress.  The patient is in her bed and asleep.  Her bedside RN was not present in the room.  We will continue to closely monitor and treat as indicated.  Time: 15 minutes.

## 2022-06-23 NOTE — Progress Notes (Signed)
Patient received 1x dose haldol IM at 1630. She was at the bottom of the bed. We removed restraints and reapplied. Offered her water and to use the bathroom. Husband is still at bedside. Patient is currently sleeping.

## 2022-06-24 DIAGNOSIS — G9341 Metabolic encephalopathy: Secondary | ICD-10-CM | POA: Diagnosis not present

## 2022-06-24 DIAGNOSIS — Z23 Encounter for immunization: Secondary | ICD-10-CM | POA: Diagnosis not present

## 2022-06-24 DIAGNOSIS — Z66 Do not resuscitate: Secondary | ICD-10-CM | POA: Diagnosis not present

## 2022-06-24 DIAGNOSIS — E43 Unspecified severe protein-calorie malnutrition: Secondary | ICD-10-CM | POA: Diagnosis not present

## 2022-06-24 LAB — CBC
HCT: 43.3 % (ref 36.0–46.0)
Hemoglobin: 14.2 g/dL (ref 12.0–15.0)
MCH: 29 pg (ref 26.0–34.0)
MCHC: 32.8 g/dL (ref 30.0–36.0)
MCV: 88.5 fL (ref 80.0–100.0)
Platelets: 310 10*3/uL (ref 150–400)
RBC: 4.89 MIL/uL (ref 3.87–5.11)
RDW: 13.9 % (ref 11.5–15.5)
WBC: 4.6 10*3/uL (ref 4.0–10.5)
nRBC: 0 % (ref 0.0–0.2)

## 2022-06-24 LAB — COMPREHENSIVE METABOLIC PANEL
ALT: 19 U/L (ref 0–44)
AST: 41 U/L (ref 15–41)
Albumin: 3.5 g/dL (ref 3.5–5.0)
Alkaline Phosphatase: 52 U/L (ref 38–126)
Anion gap: 13 (ref 5–15)
BUN: 19 mg/dL (ref 6–20)
CO2: 21 mmol/L — ABNORMAL LOW (ref 22–32)
Calcium: 9.2 mg/dL (ref 8.9–10.3)
Chloride: 106 mmol/L (ref 98–111)
Creatinine, Ser: 1.29 mg/dL — ABNORMAL HIGH (ref 0.44–1.00)
GFR, Estimated: 49 mL/min — ABNORMAL LOW (ref >60)
Glucose, Bld: 86 mg/dL (ref 70–99)
Potassium: 5.2 mmol/L — ABNORMAL HIGH (ref 3.5–5.1)
Sodium: 140 mmol/L (ref 135–145)
Total Bilirubin: 1.1 mg/dL (ref 0.3–1.2)
Total Protein: 6.7 g/dL (ref 6.5–8.1)

## 2022-06-24 MED ORDER — OLANZAPINE 5 MG PO TABS
5.0000 mg | ORAL_TABLET | Freq: Two times a day (BID) | ORAL | Status: DC | PRN
  Administered 2022-06-27 – 2022-11-03 (×2): 5 mg via ORAL
  Filled 2022-06-24 (×5): qty 1

## 2022-06-24 MED ORDER — LORAZEPAM 2 MG/ML PO CONC: 1.0000 mg | Freq: Once | ORAL | Status: DC

## 2022-06-24 MED ORDER — LORAZEPAM 2 MG/ML IJ SOLN
1.0000 mg | Freq: Once | INTRAMUSCULAR | Status: AC
  Administered 2022-06-24: 1 mg via INTRAMUSCULAR
  Filled 2022-06-24: qty 1

## 2022-06-24 MED ORDER — OLANZAPINE 10 MG IM SOLR
5.0000 mg | Freq: Two times a day (BID) | INTRAMUSCULAR | Status: DC | PRN
  Administered 2022-10-02 – 2022-11-05 (×3): 5 mg via INTRAMUSCULAR
  Filled 2022-06-24 (×5): qty 10

## 2022-06-24 NOTE — Consult Note (Signed)
[facility] Followup Face-to-Face Psychiatric Evaluation   Name: Natalie Brown DOB: [birth year] MRN: [id number] Service Date: June 24, 2022 LOS:  LOS: 180 days  Reason for Consult: " Med management" Referring Provider: Milagros Reap, MD   Assessment  Natalie Brown is Brown 57 y.o. female admitted medically for 12/24/2021 11:34 PM for possible catatonia/altered mental status. She carries the psychiatric diagnoses of bipolar affective disorder with psychotic symptoms and has Brown past medical history of hypertension.  This patient has been admitted to the hospital since 12/24/2021; she was transferred over from the behavioral health hospital where she was initially admitted with mania and left with Brown dementia diagnosis. She was originally transferred to work up catatonia and altered mental status. Psychiatry was involved early in hospitalization, but signed off due to relative stability and no medication changes. We were reconsulted 3/4 by Dr. [place] due to "Please reassess patient's current antipsychotic medication regimen, oral Haldol increased and utilizing increased PRNs without appreciable effect in terms of her agitation." Per nursing report, pt's long-term sitter was discontinued around the time behavior escalated (was reinstated over the weekend).   On my exam, pt was lying in bed with minimal interaction with the outside world. She had significant rigidity/dystonia in b/l upper extremity and was completely mute. At times she appeared to be posturing and holding her head above the pillow for up to Brown couple of minutes at Brown time. I ordered an ativan trial with some improvement in dystonia (pt also vocalized to me for the first time); however spoke to nursing staff and improvement actually started before ativan was given after Brown large BM.    Her overall presentation of altered mental status is most consistent with dementia, per neurology, EEG earlier this hospitalization showed no  seizures, and imaging shows chronic atrophy, c/w dementia. She has no overt psychosis on my or recent prior assessments and does not appear internally preoccupied. She was on haldol 1 BID and relatively stable (walking around the unit) until this weekend; unclear what precipitated agitation.  Notably was on paliperidone LAI while on inpatient psychiatry which was not continued when she came her - unclear if this was intentional or not, however given primary diagnosis of dementia, numerous episodes of catatonia, and pt in facility for forseeable future do NOT see an argument for restarting LAI at this time. I do think it would be reasonable to switch antipsychotic from haloperidol to risperidone or paliperidone (as initial response seemed to be driven by these agents rather than haldol) however unable to get in touch with mother/legal guardian x3. Will try again tomorrow. I have reduced the haldol to 1 BID in the interim.   Please see plan below for detailed recommendations.   Diagnoses:  Active Hospital problems: Principal Problem:   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) Active Problems:   Bipolar affective disorder, current episode manic with psychotic symptoms (Natalie Brown)   Tobacco abuse   Delirium due to multiple etiologies, acute, hyperactive   Sinus tachycardia   Altered mental state   B12 deficiency   COVID-19 virus infection   Drooling   Essential hypertension     Plan  ## Safety and Observation Level:  - Based on my clinical evaluation, I estimate the patient to be at moderate risk of self harm in the current setting of safety unawareness - At this time, we recommend Brown 1:1 level of observation. This decision is based on recent escalation in behaviors after sitter d/c.    ##  Medications:  --DECREASE Haldol to 1 mg BID given rigidity, dystonia  - transition to paliperidone 3 with guardian consent?   -- MODIFIED PRN agitation orders to following  - olanzapine 5 PO/IM  for significant agitation with threat of harm to self or others  - please avoid IM haldol, geodon if possible - understand this may sometimes be necessary for pt/staff safety   -- prior propranalol d/c 03/2022, atropine dc 01/2022  ## Medical Decision Making Capacity:  Patient has interim legal guardian, mother Natalie Brown [phone])  ## Further Work-up:  -- most recent EKG on 9/5 had QtC of 426 - added on new given large inc antipsychotics  -- Pertinent labwork reviewed earlier this admission includes:  D-dimer 1.55 TSH 1.371 CK 263-> 256 CBC WNL BMP largely WNL, only mildly elevated creatinine Ammonia 39 Lactic acid 1.3  ## Disposition:  --Recommend memory care unit if possible; not recommended to return to [facility] upon discharge  ## Behavioral / Environmental:  -- 1:1, delirium precautions DELIRIUM RECS 1: Avoid benzodiazepines, antihistamines, anticholinergics, and minimize opiate use as these may worsen delirium. 2:Assess, prevent and manage pain as lack of treatment can result in delirium.  3: Recommend consult to PT/OT if not already done. Early mobility and exercise has been shown to decrease duration of delirium.  4:Provide appropriate lighting and clear signage; Brown clock and calendar should be easily visible to the patient. 5:Monitor environmental factors. Reduce light and noise at night (close shades, turn off lights, turn off TV, ect). Correct any alterations in sleep cycle. 6: Reorient the patient to person, place, time and situation on each encounter.  7: Correct sensory deficits if possible (replace eye glasses, hearing aids, ect). 8: Avoid restraints. Severely delirious patients benefit from constant observation by Brown sitter. 9: Do not leave patient unattended.     ##Legal Status Patient with legal guardian  Thank you for this consult request. Recommendations have been communicated to the primary team.  We will continue to follow at this time.   Natalie Brown  Natalie Brown  Total Time Spent in Direct Patient Care:  I personally spent 35 minutes on the unit in direct patient care. The direct patient care time included face-to-face time with the patient, reviewing the patient's chart, communicating with other professionals, and coordinating care. Greater than 50% of this time was spent in counseling or coordinating care with the patient regarding goals of hospitalization, psycho-education, and discharge planning needs.   Jeral Fruit,  MD Psychiatrist   NEW history  Relevant Aspects of Hospital Course:  Admitted on 12/24/2021 for acute encephalopathy.  Patient Report:  Patient is seen at bedside - initially almost totally non-interactive with prominent b/l u/e rigidity, no l/e rigidity + grasp reflex. Mute. No oral intake today. Discussed ativan challenge with nursing, saw again after. Large improvement, either due to ativan or BM shortly before. She did vocalize Brown couple of times during second assessment but not consistently. Unable to engage in ROS. Overall pretty significant decline (both on exam, chart review, and by nursing report) since fall 2023.   Psychiatric History:  Information collected from chart. Bipolar affective disorder with psychotic features, MNCD Admitted to [facility] since Sep 06, 2021  Family psych history: Patient reports mental illness runs in her father's side of the family.   Social History:   Tobacco use: Half PPD; no use since admission Alcohol use: No use since admission Drug use: Denies; UDS negative on admission in May  Family History:  The patient's family history  is not on file.  Medical History: Past Medical History:  Diagnosis Date   Bipolar 1 disorder with moderate mania ([place]) 12/30/2013   Hypertension     Surgical History: History reviewed. No pertinent surgical history.  Medications:   Current Facility-Administered Medications:    (feeding supplement) PROSource Plus liquid 30 mL, 30 mL, Oral, BID  BM, Thurnell Lose, MD, 30 mL at 06/24/22 1325   acetaminophen (TYLENOL) tablet 650 mg, 650 mg, Oral, Q6H PRN, Orma Flaming, MD, 650 mg at 06/14/22 [id number]   alum & mag hydroxide-simeth (MAALOX/MYLANTA) 200-200-20 MG/5ML suspension 30 mL, 30 mL, Oral, Q4H PRN, Orma Flaming, MD   cyanocobalamin (VITAMIN B12) tablet 500 mcg, 500 mcg, Oral, Daily, Vance Gather B, MD, 500 mcg at 06/23/22 1032   enoxaparin (LOVENOX) injection 40 mg, 40 mg, Subcutaneous, Q24H, Ghimire, Henreitta Leber, MD, 40 mg at 06/24/22 1325   feeding supplement (KATE FARMS STANDARD 1.4) liquid 325 mL, 325 mL, Oral, BID BM, Orma Flaming, MD, 325 mL at 06/24/22 1325   fluticasone (FLONASE) 50 MCG/ACT nasal spray 1 spray, 1 spray, Each Nare, Daily PRN, Orma Flaming, MD   haloperidol (HALDOL) tablet 2 mg, 2 mg, Oral, BID, 2 mg at 06/23/22 1032 **AND** [DISCONTINUED] haloperidol (HALDOL) tablet 2 mg, 2 mg, Oral, QHS, Cosby, Loma Sousa, MD, 2 mg at 01/28/22 2203   hydrocerin (EUCERIN) cream, , Topical, BID, Orma Flaming, MD, Given at 06/22/22 2111   hydrOXYzine (ATARAX) tablet 25 mg, 25 mg, Oral, TID PRN, Orma Flaming, MD, 25 mg at 06/24/22 1012   LORazepam (ATIVAN) tablet 0.5 mg, 0.5 mg, Oral, Q6H PRN, Orma Flaming, MD, 0.5 mg at 06/23/22 0735   magnesium hydroxide (MILK OF MAGNESIA) suspension 30 mL, 30 mL, Oral, Daily PRN, Orma Flaming, MD   metoprolol tartrate (LOPRESSOR) injection 5 mg, 5 mg, Intravenous, Q8H PRN, Thurnell Lose, MD, 5 mg at 01/02/22 1857   OLANZapine (ZYPREXA) tablet 5 mg, 5 mg, Oral, BID PRN **OR** OLANZapine (ZYPREXA) injection 5 mg, 5 mg, Intramuscular, BID PRN, Mikiah Durall Brown   ondansetron (ZOFRAN) tablet 4 mg, 4 mg, Oral, Q8H PRN, Orma Flaming, MD, 4 mg at 03/17/22 0139   Oral care mouth rinse, 15 mL, Mouth Rinse, PRN, Thurnell Lose, MD  Allergies: Allergies  Allergen Reactions   Penicillins Itching       Objective  Vital Brown:  Temp:  [97.7 F (36.5 C)-98.4 F (36.9 C)] 97.9  F (36.6 C) (03/04 0815) Pulse Rate:  [65-74] 65 (03/04 0815) Resp:  [15-18] 18 (03/04 0815) BP: (104-116)/(70-79) 104/70 (03/04 0815) SpO2:  [100 %] 100 % (03/04 0815)  Psychiatric Specialty Exam:  Presentation  General Appearance: Appropriate for Environment; Fairly Groomed (in [place] from home on second exam)  Eye Contact:Fair (nonexistent on first exam)  Speech:-- (Delayed, paucity of content/amt)  Speech Volume:Normal  Handedness:Right   Mood and Affect  Mood:euthymic  Affect:-- (vacant)   Thought Process  Thought Processes:tangential  Descriptions of Associations:-- (could not assess)  Orientation:-- (self only on second eval)  Thought Content:-- (paucity)  History of Schizophrenia/Schizoaffective disorder:No  Duration of Psychotic Symptoms:N/Brown  Hallucinations:Hallucinations: -- (not overlty RIS on my exam)  denies AH, VH, TH  Ideas of Reference:-- (not abl to assess)  Suicidal Thoughts:Suicidal Thoughts: -- (not able to assess)  Denies  Homicidal Thoughts:Homicidal Thoughts: -- (not able to assess)  Denies  Sensorium  Memory:Immediate Poor; Recent Poor; Remote Poor  Judgment:Poor  Insight:Lacking   Executive Functions  Concentration:poor  Attention Span:poor  Recall:Poor  Fund of Knowledge:Poor  Language:Poor   Psychomotor Activity  Psychomotor Activity:Psychomotor Activity: Decreased   Assets  Assets:Resilience   Sleep  Sleep:Sleep: -- (unable to assess)    Physical Exam: Physical Exam Vitals reviewed.  Constitutional:      General: [name] not in acute distress.    Appearance: [name] not toxic-appearing.  HENT:     Head: Normocephalic and atraumatic.     Mouth/Throat:     Mouth: Mucous membranes are moist.     Pharynx: Oropharynx is clear.  Pulmonary:     Effort: Pulmonary effort is normal.  Skin:    General: Skin is warm and dry.  Neurological:     General: No focal deficit present.     Mental Status: She  is alert and oriented to person, place, and time.     Motor: No weakness.     Gait: Gait normal.   3/4: PE addon: On first exam pt with significant b/l u/e rigidity, muteness, grasp reflex, + posturing On second exam - rigidity improved (not resolved), now vocalizing, no longer posturing  Blood pressure 104/70, pulse 65, temperature 97.9 F (36.6 C), temperature source Oral, resp. rate 18, height '5\' 6"'$  (1.676 m), weight 72 kg, SpO2 100 %. Body mass index is 25.62 kg/m.  Review of Systems  Psychiatric/Behavioral:  Negative for depression, hallucinations and suicidal ideas. The patient is not nervous/anxious.

## 2022-06-24 NOTE — Progress Notes (Signed)
PROGRESS NOTE    Natalie Brown  [id number] DOB: October 26, 1965 DOA: 12/24/2021 PCP: Marliss Coots, NP    Brief Narrative:   Natalie Brown is a 57 y.o. female with past medical history significant for bipolar affective disorder, chronic dementia with advanced cerebral atrophy, behavioral disturbance who was initially admitted to the behavioral health hospital on 09/07/2021 with aggressive behavior.  Patient was transferred to [facility] on 12/24/2021 for altered mental status with concern for catatonia.  Psychiatry and neurology evaluated patient during her hospitalization.    Patient is medically stable for discharge and awaiting placement at a memory care facility.  Assessment & Plan:   Acute metabolic encephalopathy in the setting of chronic progressive dementia Patient was initially admitted to behavioral health Hospital for aggressive behavior and transferred to [facility] for altered mental status with concern for catatonia.  Patient was seen by psychiatry and neurology during hospitalization with MRI and EEG unrevealing.  ANA and metal panel negative June 2023.  Mental status remains at baseline, in which she does not participate much in conversation but ambulates freely during today. -- Continue close supervision, given history of unintentional elopement -- Pending placement, medically stable for discharge once facility identified  Bipolar disorder -- Increased Haldol to 2 mg p.o. twice daily -- Zyprexa 5 mg p.o. every 8 hours as needed agitation -- Hydroxyzine '25mg'$  PO TID PRN anxiety -- Ativan 0.5 mg p.o. every 6 hours as needed anxiety/agitation -- Patient is afebrile without leukocytosis, will check urinalysis to rule out infectious etiology to her increased agitation. -- Will reconsult psychiatry today for reevaluation and recommendations regarding her medication regimen  Essential hypertension Currently off antihypertensive therapy.  Blood pressure  stable, 115/79.  CKD stage IIIb Stable, creatinine 1.29 today  Vitamin B12 deficiency Vitamin B12 500 mcg p.o. daily  Debility, deconditioning Medically stable for discharge, pending placement.  Further per TOC.   DVT prophylaxis: enoxaparin (LOVENOX) injection 40 mg Start: 12/26/21 1200    Code Status: Full Code Family Communication: No family present at bedside this morning  Disposition Plan:  Level of care: Med-Surg Status is: Inpatient Remains inpatient appropriate because: Medically stable to discharge to memory care unit once bed identified, further per [facility]    Consultants:  Psychiatry Neurology  Procedures:  EEG  Antimicrobials:  None   Subjective: Patient seen examined bedside, lying in bed.  Sitter present.  Replaced in restraints yesterday due to increased agitation.  Patient received multiple PRNs including Geodon, Zyprexa, Haldol without improvement.  Patient's oral Haldol also increased to 2 mg p.o. twice daily.  Patient remains afebrile without leukocytosis, no current concerns for infectious etiology leading to her increased agitation.  Will check urinalysis.  Will also reconsult psychiatry for further recommendations regarding increased agitation for need of medication adjustment.  Discussed with RN and charge RN this morning. Medically stable for discharge once bed available per TOC.  Unable to obtain any further ROS due to underlying dementia.  Objective: Vitals:   06/23/22 1637 06/23/22 2148 06/24/22 0629 06/24/22 0815  BP: 93/79 116/70 115/79 104/70  Pulse: 95 72 74 65  Resp: '16 15 15 18  '$ Temp: 97.7 F (36.5 C) 98.4 F (36.9 C) 97.7 F (36.5 C) 97.9 F (36.6 C)  TempSrc: Oral  Oral Oral  SpO2: 100% 100% 100% 100%  Weight:      Height:        Intake/Output Summary (Last 24 hours) at 06/24/2022 1006 Last data filed at 06/23/2022  1643 Gross per 24 hour  Intake 312 ml  Output --  Net 312 ml    Filed Weights   02/03/22 2025  Weight: 72 kg     Examination:  Physical Exam: GEN: NAD, alert, lying in bed with restraints in place, sitter present HEENT: NCAT, PERRL, EOMI, sclera clear, MMM PULM: CTAB w/o wheezes/crackles, normal respiratory effort, on room air CV: RRR w/o M/G/R GI: abd soft, NTND, NABS MSK: no peripheral edema, moves all extremities independently    Data Reviewed: I have personally reviewed following labs and imaging studies  CBC: Recent Labs  Lab 06/24/22 0836  WBC 4.6  HGB 14.2  HCT 43.3  MCV 88.5  PLT 99991111    Basic Metabolic Panel: Recent Labs  Lab 06/24/22 0836  NA 140  K 5.2*  CL 106  CO2 21*  GLUCOSE 86  BUN 19  CREATININE 1.29*  CALCIUM 9.2    GFR: Estimated Creatinine Clearance: 50.1 mL/min (A) (by C-G formula based on SCr of 1.29 mg/dL (H)). Liver Function Tests: Recent Labs  Lab 06/24/22 0836  AST 41  ALT 19  ALKPHOS 52  BILITOT 1.1  PROT 6.7  ALBUMIN 3.5   No results for input(s): "LIPASE", "AMYLASE" in the last 168 hours. No results for input(s): "AMMONIA" in the last 168 hours. Coagulation Profile: No results for input(s): "INR", "PROTIME" in the last 168 hours. Cardiac Enzymes: No results for input(s): "CKTOTAL", "CKMB", "CKMBINDEX", "TROPONINI" in the last 168 hours. BNP (last 3 results) No results for input(s): "PROBNP" in the last 8760 hours. HbA1C: No results for input(s): "HGBA1C" in the last 72 hours. CBG: No results for input(s): "GLUCAP" in the last 168 hours. Lipid Profile: No results for input(s): "CHOL", "HDL", "LDLCALC", "TRIG", "CHOLHDL", "LDLDIRECT" in the last 72 hours. Thyroid Function Tests: No results for input(s): "TSH", "T4TOTAL", "FREET4", "T3FREE", "THYROIDAB" in the last 72 hours. Anemia Panel: No results for input(s): "VITAMINB12", "FOLATE", "FERRITIN", "TIBC", "IRON", "RETICCTPCT" in the last 72 hours. Sepsis Labs: No results for input(s): "PROCALCITON", "LATICACIDVEN" in the last 168 hours.  No results found for this or any  previous visit (from the past 240 hour(s)).       Radiology Studies: No results found.      Scheduled Meds:  (feeding supplement) PROSource Plus  30 mL Oral BID BM   vitamin B-12  500 mcg Oral Daily   enoxaparin (LOVENOX) injection  40 mg Subcutaneous Q24H   feeding supplement (KATE FARMS STANDARD 1.4)  325 mL Oral BID BM   haloperidol  2 mg Oral BID   hydrocerin   Topical BID   Continuous Infusions:   LOS: 180 days    Time spent: 42 minutes spent on chart review, discussion with nursing staff, consultants, updating family and interview/physical exam; more than 50% of that time was spent in counseling and/or coordination of care.    Sherri Mcarthy J [place] DO [facility] Available via Epic secure chat 7am-7pm After these hours, please refer to coverage provider listed on [email or web address] 06/24/2022, 10:06 AM

## 2022-06-25 DIAGNOSIS — R451 Restlessness and agitation: Secondary | ICD-10-CM

## 2022-06-25 NOTE — Consult Note (Signed)
[facility] Followup Face-to-Face Psychiatric Evaluation   Name: Natalie Brown DOB: [birth year] MRN: [id number] Service Date: June 25, 2022 LOS:  LOS: 181 days  Reason for Consult: "increased agitation/bipolar disorder" Referring Provider: [place] Eric DO   Assessment  Natalie Brown is a 57 y.o. female admitted medically for 12/24/2021 11:34 PM for possible catatonia/altered mental status. She carries the psychiatric diagnoses of bipolar affective disorder with psychotic symptoms and has a past medical history of hypertension.  Again attempted to call guardian x2 to transition patient from haldol to paliperidone given concern for dystonia but call went to VM. Reduction in haldol dose appears to have aided with rigidity noted yesterday.   Sitter reports patient has not had any further agitation since this past Sunday. Sitter does note patient has been largely nonverbal but has been eating and sleeping appropriately and did ambulate to return food tray to worker.   Recommend continuing sitter to aid with reorientation and continuing haldol at current dose. [name] compliant with PO medication today but had been noncompliant for past 2 days. Will continue to follow to assess if further medication adjustments are necessary.  Diagnoses:  Active Hospital problems: Principal Problem:   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) Active Problems:   Bipolar affective disorder, current episode manic with psychotic symptoms ([place])   Tobacco abuse   Delirium due to multiple etiologies, acute, hyperactive   Sinus tachycardia   Altered mental state   B12 deficiency   COVID-19 virus infection   Drooling   Essential hypertension     Plan  ## Safety and Observation Level:  - Based on my clinical evaluation, I estimate the patient to be at moderate risk of self harm in the current setting of safety unawareness - Recommend continuing 1:1 given she appears to require  frequent reorientation   ## Medications:  --Continue Haldol to 1 mg BID given rigidity, dystonia  - consider transition to paliperidone 3 mg daily with guardian consent  --PRN agitation orders to following  - olanzapine 5 PO/IM for significant agitation with threat of harm to self or others  - please avoid IM haldol, geodon if possible  -- prior propranalol d/c 03/2022, atropine dc 01/2022  ## Medical Decision Making Capacity:  Patient has interim legal guardian, mother Natalie Brown [phone])  ## Further Work-up:  -- most recent EKG on 9/5 had QtC of 426 - added on new given large inc antipsychotics  -- Pertinent labwork reviewed earlier this admission includes:  D-dimer 1.55 TSH 1.371 CK 263-> 256 CBC WNL BMP largely WNL, only mildly elevated creatinine Ammonia 39 Lactic acid 1.3  ## Disposition:  --Recommend memory care unit if possible; not recommended to return to [facility] upon discharge  ## Behavioral / Environmental:  -- 1:1, delirium precautions     ##Legal Status Patient with legal guardian  Thank you for this consult request. Recommendations have been communicated to the primary team.  We will continue to follow at this time.   Natalie Ravens, MD PGY2 Psychiatry Resident   NEW history  Relevant Aspects of Hospital Course:  Admitted on 12/24/2021 for acute encephalopathy.  Patient Report:  Patient is seen at bedside with sitter, medical student, and attending present. She had been predominantly mute during assessment but did blurt out twice "I was asleep". Unable to engage in ROS. Overall pretty significant decline (both on exam, chart review, and by nursing report) since fall 2023.  Patient appeared very anxious when I approached her  to assess for rigidity but, with assistance of sitter, did allow for me to rotate her upper extremities which appear less rigid than noted yesterday per attending.  Psychiatric History:  Information collected from chart. Bipolar  affective disorder with psychotic features, MNCD Admitted to [facility] since Sep 06, 2021  Family psych history: Patient reports mental illness runs in her father's side of the family.   Social History:  Tobacco use: Half PPD; no use since admission Alcohol use: No use since admission Drug use: Denies; UDS negative on admission in May  Family History:  No pertinent FHx  Medical History: Past Medical History:  Diagnosis Date   Bipolar 1 disorder with moderate mania ([place]) 12/30/2013   Hypertension     Surgical History: History reviewed. No pertinent surgical history.  Medications:   Current Facility-Administered Medications:    (feeding supplement) PROSource Plus liquid 30 mL, 30 mL, Oral, BID BM, Thurnell Lose, MD, 30 mL at 06/24/22 1325   acetaminophen (TYLENOL) tablet 650 mg, 650 mg, Oral, Q6H PRN, Orma Flaming, MD, 650 mg at 06/14/22 [id number]   alum & mag hydroxide-simeth (MAALOX/MYLANTA) 200-200-20 MG/5ML suspension 30 mL, 30 mL, Oral, Q4H PRN, Orma Flaming, MD   cyanocobalamin (VITAMIN B12) tablet 500 mcg, 500 mcg, Oral, Daily, Vance Gather B, MD, 500 mcg at 06/23/22 1032   enoxaparin (LOVENOX) injection 40 mg, 40 mg, Subcutaneous, Q24H, Ghimire, Henreitta Leber, MD, 40 mg at 06/24/22 1325   feeding supplement (KATE FARMS STANDARD 1.4) liquid 325 mL, 325 mL, Oral, BID BM, Orma Flaming, MD, 325 mL at 06/24/22 1325   fluticasone (FLONASE) 50 MCG/ACT nasal spray 1 spray, 1 spray, Each Nare, Daily PRN, Orma Flaming, MD   haloperidol (HALDOL) tablet 2 mg, 2 mg, Oral, BID, 2 mg at 06/23/22 1032 **AND** [DISCONTINUED] haloperidol (HALDOL) tablet 2 mg, 2 mg, Oral, QHS, Cosby, Loma Sousa, MD, 2 mg at 01/28/22 2203   hydrocerin (EUCERIN) cream, , Topical, BID, Orma Flaming, MD, Given at 06/22/22 2111   hydrOXYzine (ATARAX) tablet 25 mg, 25 mg, Oral, TID PRN, Orma Flaming, MD, 25 mg at 06/24/22 1012   LORazepam (ATIVAN) tablet 0.5 mg, 0.5 mg, Oral, Q6H PRN, Orma Flaming, MD, 0.5 mg at  06/23/22 0735   magnesium hydroxide (MILK OF MAGNESIA) suspension 30 mL, 30 mL, Oral, Daily PRN, Orma Flaming, MD   metoprolol tartrate (LOPRESSOR) injection 5 mg, 5 mg, Intravenous, Q8H PRN, Thurnell Lose, MD, 5 mg at 01/02/22 1857   OLANZapine (ZYPREXA) tablet 5 mg, 5 mg, Oral, BID PRN **OR** OLANZapine (ZYPREXA) injection 5 mg, 5 mg, Intramuscular, BID PRN, Cinderella, Margaret A   ondansetron (ZOFRAN) tablet 4 mg, 4 mg, Oral, Q8H PRN, Orma Flaming, MD, 4 mg at 03/17/22 0139   Oral care mouth rinse, 15 mL, Mouth Rinse, PRN, Thurnell Lose, MD  Allergies: Allergies  Allergen Reactions   Penicillins Itching       Objective  Vital Brown:  Temp:  [97.8 F (36.6 C)] 97.8 F (36.6 C) (03/04 2100) Pulse Rate:  [93] 93 (03/04 2100) Resp:  [16] 16 (03/04 2100) BP: (96)/(61) 96/61 (03/04 2100) SpO2:  [100 %] 100 % (03/04 2100)  Psychiatric Specialty Exam:  Presentation  General Appearance: Appropriate Eye Contact:Fair (nonexistent on first exam)  Speech:-- (Delayed, paucity of content/amt)  Speech Volume:Normal  Handedness:Right   Mood and Affect  Mood:euthymic  Affect: vacant but anxious when approached  Thought Process  Thought Processes:tangential  Descriptions of Associations:-- (could not assess)  Orientation: unable to assess  given largely mute Thought Content:-- (paucity)  History of Schizophrenia/Schizoaffective disorder:No  Hallucinations: does not appear responding to internal stimuli but did not formally assess  Ideas of Reference:-- (not abl to assess)  Suicidal Thoughts:Suicidal Thoughts: -- (not able to assess)  Denies  Homicidal Thoughts:Homicidal Thoughts: -- (not able to assess)  Denies  Sensorium  Memory:Immediate Poor; Recent Poor; Remote Poor  Judgment:Poor  Insight:Lacking   Executive Functions  Concentration:poor  Attention Span:poor  Recall:Poor  Fund of Knowledge:Poor  Language:Poor   Psychomotor Activity   Psychomotor Activity:Psychomotor Activity: Decreased   Assets  Assets:Resilience   Sleep  Sleep:Sleep: -- (unable to assess)    Physical Exam: Physical Exam Vitals reviewed.  Constitutional:      General: [name] not in acute distress.    Appearance: [name] not toxic-appearing.  HENT:     Head: Normocephalic and atraumatic.     Mouth/Throat:     Mouth: Mucous membranes are moist.     Pharynx: Oropharynx is clear.  Pulmonary:     Effort: Pulmonary effort is normal.  Skin:    General: Skin is warm and dry.  Neurological:     General: No focal deficit present.     Mental Status: [name] alert and oriented to person, place, and time.     Motor: No weakness.     Gait: Gait normal.  MSK: continues to have muscle rigidity but appears intentional  Blood pressure 96/61, pulse 93, temperature 97.8 F (36.6 C), temperature source Axillary, resp. rate 16, height '5\' 6"'$  (1.676 m), weight 72 kg, SpO2 100 %. Body mass index is 25.62 kg/m.  Review of Systems  Psychiatric/Behavioral:  Negative for depression, hallucinations and suicidal ideas. The patient is not nervous/anxious.

## 2022-06-25 NOTE — Care Plan (Signed)
Patient Natalie Brown, reacts to her name, occasionally flows commends. She had an uneventful day, had sitter in room. Refused some of her meds and nutrition suppliments, ate 50% of lunch and refused dinner. She walked with assistance stand-by on the hallway. Patient was reoriented and plan of care was discussed with her without evidence of learning.

## 2022-06-25 NOTE — Progress Notes (Signed)
PROGRESS NOTE    Natalie Brown  [id number] DOB: 1965-10-09 DOA: 12/24/2021 PCP: Marliss Coots, NP    Brief Narrative:   Natalie Brown is a 57 y.o. female with past medical history significant for bipolar affective disorder, chronic dementia with advanced cerebral atrophy, behavioral disturbance who was initially admitted to the behavioral health hospital on 09/07/2021 with aggressive behavior.  Patient was transferred to [facility] on 12/24/2021 for altered mental status with concern for catatonia.  Psychiatry and neurology evaluated patient during her hospitalization.    Patient is medically stable for discharge and awaiting placement at a memory care facility.  Assessment & Plan:   Acute metabolic encephalopathy in the setting of chronic progressive dementia Patient was initially admitted to behavioral health Hospital for aggressive behavior and transferred to [facility] for altered mental status with concern for catatonia.  Patient was seen by psychiatry and neurology during hospitalization with MRI and EEG unrevealing.  ANA and metal panel negative June 2023.  Mental status remains at baseline, in which she does not participate much in conversation but ambulates freely during today. -- Continue close supervision, given history of unintentional elopement -- Pending placement, medically stable for discharge once facility identified  Bipolar disorder -- Haldol 2 mg p.o. twice daily -- Zyprexa 5 mg PO/IM BID PRN significant aggitation -- Hydroxyzine '25mg'$  PO TID PRN anxiety -- Ativan 0.5 mg p.o. every 6 hours as needed anxiety/agitation -- Psychiatry reconsulted on 3/4 for reevaluation and medication adjustments for increased agitation  Essential hypertension Currently off antihypertensive therapy.  Blood pressure stable, 115/79.  CKD stage IIIb Stable, creatinine 1.29   Vitamin B12 deficiency Vitamin B12 500 mcg p.o. daily  Debility,  deconditioning Medically stable for discharge, pending placement.  Further per TOC.   DVT prophylaxis: enoxaparin (LOVENOX) injection 40 mg Start: 12/26/21 1200    Code Status: Full Code Family Communication: No family present at bedside this morning  Disposition Plan:  Level of care: Med-Surg Status is: Inpatient Remains inpatient appropriate because: Medically stable to discharge to memory care unit once bed identified, further per [facility]    Consultants:  Psychiatry>> reconsulted 3/4 Neurology  Procedures:  EEG  Antimicrobials:  None   Subjective: Patient seen examined bedside, lying in bed.  Sitter present.  Now out of restraints.  Sitter reports patient slept well last night with decreased agitation.  Discussed with charge RN this morning.  Psychiatry reconsulted yesterday for consideration of antipsychotic adjustment.  Medically stable for discharge once bed available per TOC.  Unable to obtain any further ROS due to underlying dementia.  Objective: Vitals:   06/23/22 2148 06/24/22 0629 06/24/22 0815 06/24/22 2100  BP: 116/70 115/79 104/70 96/61  Pulse: 72 74 65 93  Resp: '15 15 18 16  '$ Temp: 98.4 F (36.9 C) 97.7 F (36.5 C) 97.9 F (36.6 C) 97.8 F (36.6 C)  TempSrc:  Oral Oral Axillary  SpO2: 100% 100% 100% 100%  Weight:      Height:        Intake/Output Summary (Last 24 hours) at 06/25/2022 0908 Last data filed at 06/25/2022 0750 Gross per 24 hour  Intake 240 ml  Output --  Net 240 ml    Filed Weights   02/03/22 2025  Weight: 72 kg    Examination:  Physical Exam: GEN: NAD, alert, lying in bed, sitter present, not conversive HEENT: NCAT, PERRL, EOMI, sclera clear, MMM PULM: CTAB w/o wheezes/crackles, normal respiratory effort, on room air CV: RRR  w/o M/G/R GI: abd soft, NTND, NABS MSK: no peripheral edema, moves all extremities independently    Data Reviewed: I have personally reviewed following labs and imaging studies  CBC: Recent Labs  Lab  06/24/22 0836  WBC 4.6  HGB 14.2  HCT 43.3  MCV 88.5  PLT 99991111    Basic Metabolic Panel: Recent Labs  Lab 06/24/22 0836  NA 140  K 5.2*  CL 106  CO2 21*  GLUCOSE 86  BUN 19  CREATININE 1.29*  CALCIUM 9.2    GFR: Estimated Creatinine Clearance: 50.1 mL/min (A) (by C-G formula based on SCr of 1.29 mg/dL (H)). Liver Function Tests: Recent Labs  Lab 06/24/22 0836  AST 41  ALT 19  ALKPHOS 52  BILITOT 1.1  PROT 6.7  ALBUMIN 3.5   No results for input(s): "LIPASE", "AMYLASE" in the last 168 hours. No results for input(s): "AMMONIA" in the last 168 hours. Coagulation Profile: No results for input(s): "INR", "PROTIME" in the last 168 hours. Cardiac Enzymes: No results for input(s): "CKTOTAL", "CKMB", "CKMBINDEX", "TROPONINI" in the last 168 hours. BNP (last 3 results) No results for input(s): "PROBNP" in the last 8760 hours. HbA1C: No results for input(s): "HGBA1C" in the last 72 hours. CBG: No results for input(s): "GLUCAP" in the last 168 hours. Lipid Profile: No results for input(s): "CHOL", "HDL", "LDLCALC", "TRIG", "CHOLHDL", "LDLDIRECT" in the last 72 hours. Thyroid Function Tests: No results for input(s): "TSH", "T4TOTAL", "FREET4", "T3FREE", "THYROIDAB" in the last 72 hours. Anemia Panel: No results for input(s): "VITAMINB12", "FOLATE", "FERRITIN", "TIBC", "IRON", "RETICCTPCT" in the last 72 hours. Sepsis Labs: No results for input(s): "PROCALCITON", "LATICACIDVEN" in the last 168 hours.  No results found for this or any previous visit (from the past 240 hour(s)).       Radiology Studies: No results found.      Scheduled Meds:  (feeding supplement) PROSource Plus  30 mL Oral BID BM   vitamin B-12  500 mcg Oral Daily   enoxaparin (LOVENOX) injection  40 mg Subcutaneous Q24H   feeding supplement (KATE FARMS STANDARD 1.4)  325 mL Oral BID BM   haloperidol  2 mg Oral BID   hydrocerin   Topical BID   Continuous Infusions:   LOS: 181 days     Time spent: 42 minutes spent on chart review, discussion with nursing staff, consultants, updating family and interview/physical exam; more than 50% of that time was spent in counseling and/or coordination of care.    Martiza Speth J [place] DO [facility] Available via Epic secure chat 7am-7pm After these hours, please refer to coverage provider listed on [email or web address] 06/25/2022, 9:08 AM

## 2022-06-26 LAB — CBC
HCT: 40.1 % (ref 36.0–46.0)
Hemoglobin: 13.8 g/dL (ref 12.0–15.0)
MCH: 29.6 pg (ref 26.0–34.0)
MCHC: 34.4 g/dL (ref 30.0–36.0)
MCV: 86.1 fL (ref 80.0–100.0)
Platelets: 311 10*3/uL (ref 150–400)
RBC: 4.66 MIL/uL (ref 3.87–5.11)
RDW: 13.7 % (ref 11.5–15.5)
WBC: 5.1 10*3/uL (ref 4.0–10.5)
nRBC: 0 % (ref 0.0–0.2)

## 2022-06-26 LAB — COMPREHENSIVE METABOLIC PANEL
ALT: 20 U/L (ref 0–44)
AST: 26 U/L (ref 15–41)
Albumin: 3.5 g/dL (ref 3.5–5.0)
Alkaline Phosphatase: 51 U/L (ref 38–126)
Anion gap: 8 (ref 5–15)
BUN: 12 mg/dL (ref 6–20)
CO2: 26 mmol/L (ref 22–32)
Calcium: 9.2 mg/dL (ref 8.9–10.3)
Chloride: 107 mmol/L (ref 98–111)
Creatinine, Ser: 1.23 mg/dL — ABNORMAL HIGH (ref 0.44–1.00)
GFR, Estimated: 52 mL/min — ABNORMAL LOW (ref >60)
Glucose, Bld: 116 mg/dL — ABNORMAL HIGH (ref 70–99)
Potassium: 4.5 mmol/L (ref 3.5–5.1)
Sodium: 141 mmol/L (ref 135–145)
Total Bilirubin: 0.8 mg/dL (ref 0.3–1.2)
Total Protein: 6.9 g/dL (ref 6.5–8.1)

## 2022-06-26 LAB — VITAMIN B12: Vitamin B-12: 1212 pg/mL — ABNORMAL HIGH (ref 180–914)

## 2022-06-26 NOTE — Plan of Care (Signed)
  Problem: Safety: Goal: Ability to remain free from injury will improve Outcome: Progressing   Problem: Skin Integrity: Goal: Risk for impaired skin integrity will decrease Outcome: Progressing   

## 2022-06-26 NOTE — Progress Notes (Signed)
PROGRESS NOTE    Natalie Brown  [id number] DOB: 10/10/1965 DOA: 12/24/2021 PCP: Marliss Coots, NP    Brief Narrative:  [name] is a 57 yo female with PMH bipolar disorder, HTN, recent hospitalization at [facility] who presents to the hospital with altered mentation and tachycardia. Major events: 09/07/2021 admitted to behavioral health.  Treated with Zyprexa Invega and Ativan challenge for catatonia. 12/24/2021 sent to ER from [facility] for evaluation for tachycardia 9/04-9/5, 9/5>9/9, LTM EEG  no seizure. TSH stable CTA chest no PE no pneumonia RPR nonreactive bilateral LE Doppler negative for DVT Echo with normal EF  9/23 febrile secondary to Covid 19 + no pneumonia. 9/25 repeat CT brain unremarkable. She has had prolonged hospitalization seen by psychiatry.Per psychiatry does not meet criteria to go back to behavioral health, awaiting for placement to memory care unit. 3/4 psychiatry consulted for increasing agitation   Subjective: Patient seen and examined this morning.  Sitter at the bedside Appears irritated nonverbal does not follow commands, moving extremities spontaneously Overnight patient afebrile, saturating well on room air Last labs from 3/4 w/  potassium 5.8 creatinine 1.29 normal CBC  Assessment & Plan:   Acute metabolic encephalopathy in the setting of chronic progressive dementia Intermittent agitation: initially admitted to behavioral health Hospital for aggressive behavior and transferred to [facility] for altered mental status with concern for catatonia>seen by psychiatry and neurology s/p MRI and EEG -unrevealing.  Labs ANA and negative June 2023.  Mental status remains at baseline, in which she does not participate much in conversation but ambulates freely during daytime-does not dyspneic.  Intermittently agitated 3/5: Psychiatry reconsulted for agitation to adjust medication as needed-see below  Bipolar disorder with intermittent agitation: Currently on  Haldol 2 mg twice daily, olanzapine 5 mg twice daily PRN or IM, Ativan 0.5 mg p.o. every 6 hours as needed, Atarax 25 3 times daily as needed.  Continue plan of care as per psychiatry continue bedside sitter  Essential hypertension: BP well-controlled not on meds continue PRN CKD stage IIIb: Stable at 1.29, monitor intermittently Vitamin B12 deficiency- Cont vitamin B12 500 mcg p.o. daily Debility, deconditioning: Ambulating.    Medically stable for discharge, pending placement.  Further per TOC.   DVT prophylaxis: enoxaparin (LOVENOX) injection 40 mg Start: 12/26/21 1200    Code Status: Full Code Family Communication: No family present at bedside this morning  Disposition Plan:  Level of care: Med-Surg Status is: Inpatient Remains inpatient appropriate because: Medically stable to discharge to memory care unit once bed identified, further per [facility]    Consultants:  Psychiatry>> reconsulted 3/4 Neurology  Procedures:  EEG  Antimicrobials:  None  Objective: Vitals:   06/24/22 0815 06/24/22 2100 06/25/22 1553 06/26/22 0348  BP: 104/70 96/61 (!) 117/96 105/65  Pulse: 65 93 (!) 139 91  Resp: '18 16 18 17  '$ Temp: 97.9 F (36.6 C) 97.8 F (36.6 C) 98.6 F (37 C) 98.5 F (36.9 C)  TempSrc: Oral Axillary Oral Oral  SpO2: 100% 100% 98% 100%  Weight:      Height:        Intake/Output Summary (Last 24 hours) at 06/26/2022 0749 Last data filed at 06/26/2022 0500 Gross per 24 hour  Intake 250 ml  Output --  Net 250 ml     Filed Weights   02/03/22 2025  Weight: 72 kg    Examination: General exam: alert awake,does not follow commands,older appearing HEENT:Oral mucosa moist, Ear/Nose WNL grossly, dentition normal. Respiratory system: bilaterally  CLEAR BS,no use of accessory muscle Cardiovascular system: S1 & S2 +, regular rate. Gastrointestinal system: Abdomen soft, NT,ND,BS+ Nervous System:Alert, awake, moving extremities and grossly nonfocal Extremities: LE ankle edema  neg, lower extremities warm Skin: No rashes,no icterus. MSK: Normal muscle bulk,tone, power .  Data Reviewed: I have personally reviewed following labs and imaging studies  CBC: Recent Labs  Lab 06/24/22 0836  WBC 4.6  HGB 14.2  HCT 43.3  MCV 88.5  PLT 310     Basic Metabolic Panel: Recent Labs  Lab 06/24/22 0836  NA 140  K 5.2*  CL 106  CO2 21*  GLUCOSE 86  BUN 19  CREATININE 1.29*  CALCIUM 9.2   GFR: Estimated Creatinine Clearance: 50.1 mL/min (A) (by C-G formula based on SCr of 1.29 mg/dL (H)). Liver Function Tests: Recent Labs  Lab 06/24/22 0836  AST 41  ALT 19  ALKPHOS 52  BILITOT 1.1  PROT 6.7  ALBUMIN 3.5   Radiology Studies: No results found. Scheduled Meds:  (feeding supplement) PROSource Plus  30 mL Oral BID BM   vitamin B-12  500 mcg Oral Daily   enoxaparin (LOVENOX) injection  40 mg Subcutaneous Q24H   feeding supplement (KATE FARMS STANDARD 1.4)  325 mL Oral BID BM   haloperidol  2 mg Oral BID   hydrocerin   Topical BID   Continuous Infusions:   LOS: 182 days  Time spent: 25 minutes spent on chart review, discussion with nursing staff, consultants, updating family and interview/physical exam; more than 50% of that time was spent in counseling and/or coordination of care.  Antonieta Pert, DO [facility] Available via Epic secure chat 7am-7pm After these hours, please refer to coverage provider listed on [email or web address] 06/26/2022, 7:49 AM

## 2022-06-27 NOTE — Consult Note (Signed)
[facility] Followup Face-to-Face Psychiatric Evaluation   Name: Natalie Brown DOB: 1965-12-29 MRN: [id number] Service Date: June 27, 2022 LOS:  LOS: 183 days  Reason for Consult: "increased agitation/bipolar disorder" Referring Provider: [place] Eric DO   Assessment  Natalie Brown is a 57 y.o. female admitted medically for 12/24/2021 11:34 PM for possible catatonia/altered mental status. She carries the psychiatric diagnoses of bipolar affective disorder with psychotic symptoms and has a past medical history of hypertension.  Again attempted to call guardian x2 to transition patient from haldol to paliperidone given concern for dystonia but call went to VM. Left HIPAA compliant VM.  Sitter reports patient was mildly more agitated this morning but largely unchanged. Sitter does note patient has been largely nonverbal but has been eating and sleeping appropriately and ambulated.  Recommend continuing sitter to aid with reorientation and continuing haldol at current dose. Need to obtain permission from legal guardian to switch haloperidol to paliperidone.  Diagnoses:  Active Hospital problems: Principal Problem:   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) Active Problems:   Bipolar affective disorder, current episode manic with psychotic symptoms ([place])   Tobacco abuse   Delirium due to multiple etiologies, acute, hyperactive   Sinus tachycardia   Altered mental state   B12 deficiency   COVID-19 virus infection   Drooling   Essential hypertension     Plan  ## Safety and Observation Level:  - Based on my clinical evaluation, I estimate the patient to be at moderate risk of self harm in the current setting of safety unawareness - Recommend continuing 1:1 given she appears to require frequent reorientation   ## Medications:  --Continue Haldol 2 mg BID given rigidity, dystonia  - attempting to transition to paliperidone 3 mg daily with guardian  consent  --PRN agitation orders to following  - olanzapine 5 PO/IM for significant agitation with threat of harm to self or others  - please avoid IM haldol, geodon if possible  -- prior propranalol d/c 03/2022, atropine dc 01/2022  ## Medical Decision Making Capacity:  Patient has interim legal guardian, mother Gregary Signs [phone])  ## Further Work-up:  -- most recent EKG on 9/5 had QtC of 426 - added on new given large inc antipsychotics  -- Pertinent labwork reviewed earlier this admission includes:  D-dimer 1.55 TSH 1.371 CK 263-> 256 CBC WNL BMP largely WNL, only mildly elevated creatinine Ammonia 39 Lactic acid 1.3  ## Disposition:  --Recommend memory care unit if possible; not recommended to return to [facility] upon discharge  ## Behavioral / Environmental:  -- 1:1, delirium precautions     ##Legal Status Patient with legal guardian  Thank you for this consult request. Recommendations have been communicated to the primary team.  We will continue to follow at this time.   France Ravens, MD PGY2 Psychiatry Resident   NEW history  Relevant Aspects of Hospital Course:  Admitted on 12/24/2021 for acute encephalopathy.  Patient Report:  Patient is seen at bedside with sitter. She was completely mute during assessment staring at her bed sheet and picking at it. Unable to engage in ROS. Behavior is not new according to sitter.  Psychiatric History:  Information collected from chart. Bipolar affective disorder with psychotic features, MNCD Admitted to [facility] since Sep 06, 2021  Family psych history: Patient reports mental illness runs in her father's side of the family.   Social History:  Tobacco use: Half PPD; no use since admission Alcohol use: No use since  admission Drug use: Denies; UDS negative on admission in May  Family History:  No pertinent FHx  Medical History: Past Medical History:  Diagnosis Date   Bipolar 1 disorder with moderate mania ([place]) 12/30/2013    Hypertension     Surgical History: History reviewed. No pertinent surgical history.  Medications:   Current Facility-Administered Medications:    (feeding supplement) PROSource Plus liquid 30 mL, 30 mL, Oral, BID BM, Thurnell Lose, MD, 30 mL at 06/27/22 0731   acetaminophen (TYLENOL) tablet 650 mg, 650 mg, Oral, Q6H PRN, Orma Flaming, MD, 650 mg at 06/14/22 [id number]   alum & mag hydroxide-simeth (MAALOX/MYLANTA) 200-200-20 MG/5ML suspension 30 mL, 30 mL, Oral, Q4H PRN, Orma Flaming, MD   cyanocobalamin (VITAMIN B12) tablet 500 mcg, 500 mcg, Oral, Daily, Vance Gather B, MD, 500 mcg at 06/27/22 0730   enoxaparin (LOVENOX) injection 40 mg, 40 mg, Subcutaneous, Q24H, Ghimire, Henreitta Leber, MD, 40 mg at 06/26/22 [id number]   feeding supplement (KATE FARMS STANDARD 1.4) liquid 325 mL, 325 mL, Oral, BID BM, Orma Flaming, MD, 325 mL at 06/26/22 1333   fluticasone (FLONASE) 50 MCG/ACT nasal spray 1 spray, 1 spray, Each Nare, Daily PRN, Orma Flaming, MD   haloperidol (HALDOL) tablet 2 mg, 2 mg, Oral, BID, 2 mg at 06/27/22 0731 **AND** [DISCONTINUED] haloperidol (HALDOL) tablet 2 mg, 2 mg, Oral, QHS, Cosby, Courtney, MD, 2 mg at 01/28/22 2203   hydrocerin (EUCERIN) cream, , Topical, BID, Orma Flaming, MD, Given at 06/26/22 2003   hydrOXYzine (ATARAX) tablet 25 mg, 25 mg, Oral, TID PRN, Orma Flaming, MD, 25 mg at 06/27/22 0431   LORazepam (ATIVAN) tablet 0.5 mg, 0.5 mg, Oral, Q6H PRN, Orma Flaming, MD, 0.5 mg at 06/27/22 0325   magnesium hydroxide (MILK OF MAGNESIA) suspension 30 mL, 30 mL, Oral, Daily PRN, Orma Flaming, MD   metoprolol tartrate (LOPRESSOR) injection 5 mg, 5 mg, Intravenous, Q8H PRN, Thurnell Lose, MD, 5 mg at 01/02/22 1857   OLANZapine (ZYPREXA) tablet 5 mg, 5 mg, Oral, BID PRN, 5 mg at 06/27/22 0731 **OR** OLANZapine (ZYPREXA) injection 5 mg, 5 mg, Intramuscular, BID PRN, Cinderella, Margaret A   ondansetron (ZOFRAN) tablet 4 mg, 4 mg, Oral, Q8H PRN, Orma Flaming, MD, 4 mg  at 03/17/22 0139   Oral care mouth rinse, 15 mL, Mouth Rinse, PRN, Thurnell Lose, MD  Allergies: Allergies  Allergen Reactions   Penicillins Itching       Objective  Vital signs:  Temp:  [98.2 F (36.8 C)] 98.2 F (36.8 C) (03/06 0900) Pulse Rate:  [55-110] 110 (03/07 0757) Resp:  [17-18] 18 (03/07 0757) BP: (112-130)/(68-81) 129/81 (03/07 0757) SpO2:  [90 %-100 %] 100 % (03/07 0757)  Psychiatric Specialty Exam:  Presentation  General Appearance: Appropriate Eye Contact:Fair (nonexistent on first exam)  Speech:-- (Delayed, paucity of content/amt)  Speech Volume:Normal  Handedness:Right   Mood and Affect  Mood:euthymic  Affect: vacant but anxious when approached  Thought Process  Thought Processes:tangential  Descriptions of Associations:-- (could not assess)  Orientation: unable to assess given largely mute Thought Content:-- (paucity)  History of Schizophrenia/Schizoaffective disorder:No  Hallucinations: does not appear responding to internal stimuli but did not formally assess  Ideas of Reference:-- (not abl to assess)  Suicidal Thoughts: unable to assess  Homicidal Thoughts: unable to assess  Sensorium  Memory: unable to assess Judgment:Poor  Insight:Lacking   Executive Functions  Concentration:poor  Attention Span:poor  Recall:Poor  Fund of Knowledge:Poor  Language:Poor   Psychomotor Activity  Psychomotor Activity: decreased   Assets  Assets:Resilience   Sleep  Sleep: fair    Physical Exam: Physical Exam Vitals reviewed.  Constitutional:      General: [name] not in acute distress.    Appearance: [name] not toxic-appearing.  HENT:     Head: Normocephalic and atraumatic.     Mouth/Throat:     Mouth: Mucous membranes are moist.     Pharynx: Oropharynx is clear.  Pulmonary:     Effort: Pulmonary effort is normal.  Skin:    General: Skin is warm and dry.  Neurological:     General: No focal deficit present.      Mental Status: [name] alert and oriented to person, place, and time.     Motor: No weakness.     Gait: Gait normal.     Blood pressure 129/81, pulse (!) 110, temperature 98.2 F (36.8 C), temperature source Oral, resp. rate 18, height '5\' 6"'$  (1.676 m), weight 72 kg, SpO2 100 %. Body mass index is 25.62 kg/m.

## 2022-06-27 NOTE — Progress Notes (Signed)
PROGRESS NOTE    Natalie Brown  [id number] DOB: [birth year] DOA: 12/24/2021 PCP: Marliss Coots, NP   Brief Narrative:Natalie Brown is a 57 yo female with PMH bipolar disorder, HTN, recent hospitalization at [facility] who presents to the hospital with altered mentation and tachycardia. Major events: 09/07/2021 admitted to behavioral health.  Treated with Zyprexa Invega and Ativan challenge for catatonia. 12/24/2021 sent to ER from [facility] for evaluation for tachycardia 9/04-9/5, 9/5>9/9, LTM EEG  no seizure. TSH stable CTA chest no PE no pneumonia RPR nonreactive bilateral LE Doppler negative for DVT Echo with normal EF  9/23 febrile secondary to Covid 19 + no pneumonia. 9/25 repeat CT brain unremarkable. She has had prolonged hospitalization seen by psychiatry.Per psychiatry does not meet criteria to go back to behavioral health, awaiting for placement to memory care unit. 3/4 psychiatry consulted for increasing agitation   Subjective:  Seen and examined.  Resting comfortably. Sitter at the bedside Standing in-front of chair and playing on computer. Last labs from 3/4 w/  potassium 5.8 creatinine 1.29 normal CBC.  Assessment & Plan: Acute metabolic encephalopathy in the setting of chronic progressive dementia Intermittent agitation: initially admitted to behavioral health Hospital for aggressive behavior and transferred to [facility] for altered mental status with concern for catatonia>seen by psychiatry and neurology s/p MRI and EEG -unrevealing.  Labs ANA and negative June 2023.  Mental status remains at baseline,in which she does not participate much in conversation but ambulates freely during daytime-does not dyspneic.Intermittently agitated. 3/5:Psychiatry reconsulted for agitation to adjust medication as needed-see below.  Bipolar disorder with intermittent agitation: Currently on Haldol 2 mg twice daily, olanzapine 5 mg twice daily PRN or IM, Ativan 0.5 mg p.o. every 6 hours as  needed, Atarax 25 3 times daily as needed.  Continue plan of care as per psychiatry continue bedside sitter  Essential hypertension: BP well-controlled not on meds continue PRN CKD stage IIIb: Stable at 1.29, monitor intermittently Vitamin B12 deficiency- Cont vitamin B12 500 mcg p.o. daily Debility, deconditioning: Ambulating.    Medically stable for discharge, pending placement.  Further per TOC.   DVT prophylaxis: enoxaparin (LOVENOX) injection 40 mg Start: 12/26/21 1200    Code Status: Full Code Family Communication: No family present at bedside this morning  Disposition Plan:  Level of care: Med-Surg Status is: Inpatient Remains inpatient appropriate because: Medically stable to discharge to memory care unit once bed identified, further per [facility]    Consultants:  Psychiatry>> reconsulted 3/4 Neurology  Procedures:  EEG  Antimicrobials:  None  Objective: Vitals:   06/26/22 0348 06/26/22 0900 06/26/22 2005 06/27/22 0757  BP: 105/65 112/68 130/76 129/81  Pulse: 91 91 (!) 55 (!) 110  Resp: '17 17 17 18  '$ Temp: 98.5 F (36.9 C) 98.2 F (36.8 C)    TempSrc: Oral Oral    SpO2: 100% 100% 90% 100%  Weight:      Height:        Intake/Output Summary (Last 24 hours) at 06/27/2022 0954 Last data filed at 06/27/2022 0757 Gross per 24 hour  Intake 360 ml  Output --  Net 360 ml    Filed Weights   02/03/22 2025  Weight: 72 kg    Examination: General exam: AAox0, weak,older appearing HEENT:Oral mucosa moist, Ear/Nose WNL grossly, dentition normal. Respiratory system: bilaterally diminished BS, no use of accessory muscle Cardiovascular system: S1 & S2 +, regular rate. Gastrointestinal system: Abdomen soft,NT,ND,BS+ Nervous System:Alert, awake, moving extremities and grossly nonfocal Extremities: LE  ankle edema neg, lower extremities warm Skin: No rashes,no icterus. MSK: Normal muscle bulk,tone, power   Data Reviewed: I have personally reviewed following labs and  imaging studies  CBC: Recent Labs  Lab 06/24/22 0836 06/26/22 0816  WBC 4.6 5.1  HGB 14.2 13.8  HCT 43.3 40.1  MCV 88.5 86.1  PLT 310 [id number]    Basic Metabolic Panel: Recent Labs  Lab 06/24/22 0836 06/26/22 0816  NA 140 141  K 5.2* 4.5  CL 106 107  CO2 21* 26  GLUCOSE 86 116*  BUN 19 12  CREATININE 1.29* 1.23*  CALCIUM 9.2 9.2  GFR: Estimated Creatinine Clearance: 52.5 mL/min (A) (by C-G formula based on SCr of 1.23 mg/dL (H)). Liver Function Tests: Recent Labs  Lab 06/24/22 0836 06/26/22 0816  AST 41 26  ALT 19 20  ALKPHOS 52 51  BILITOT 1.1 0.8  PROT 6.7 6.9  ALBUMIN 3.5 3.5  Radiology Studies: No results found. Scheduled Meds:  (feeding supplement) PROSource Plus  30 mL Oral BID BM   vitamin B-12  500 mcg Oral Daily   enoxaparin (LOVENOX) injection  40 mg Subcutaneous Q24H   feeding supplement (KATE FARMS STANDARD 1.4)  325 mL Oral BID BM   haloperidol  2 mg Oral BID   hydrocerin   Topical BID   Continuous Infusions:   LOS: 183 days  Time spent: 25 minutes spent on chart review, discussion with nursing staff, consultants, updating family and interview/physical exam; more than 50% of that time was spent in counseling and/or coordination of care.  Antonieta Pert, DO [facility] Available via Epic secure chat 7am-7pm After these hours, please refer to coverage provider listed on [email or web address] 06/27/2022, 9:54 AM

## 2022-06-28 LAB — URINALYSIS, W/ REFLEX TO CULTURE (INFECTION SUSPECTED)
Bacteria, UA: NONE SEEN
Bilirubin Urine: NEGATIVE
Glucose, UA: NEGATIVE mg/dL
Hgb urine dipstick: NEGATIVE
Ketones, ur: NEGATIVE mg/dL
Leukocytes,Ua: NEGATIVE
Nitrite: NEGATIVE
Protein, ur: NEGATIVE mg/dL
Specific Gravity, Urine: 1.021 (ref 1.005–1.030)
pH: 5 (ref 5.0–8.0)

## 2022-06-28 LAB — CBC
HCT: 42.6 % (ref 36.0–46.0)
Hemoglobin: 14 g/dL (ref 12.0–15.0)
MCH: 29.4 pg (ref 26.0–34.0)
MCHC: 32.9 g/dL (ref 30.0–36.0)
MCV: 89.3 fL (ref 80.0–100.0)
Platelets: 296 10*3/uL (ref 150–400)
RBC: 4.77 MIL/uL (ref 3.87–5.11)
RDW: 14 % (ref 11.5–15.5)
WBC: 4.4 10*3/uL (ref 4.0–10.5)
nRBC: 0 % (ref 0.0–0.2)

## 2022-06-28 LAB — COMPREHENSIVE METABOLIC PANEL
ALT: 20 U/L (ref 0–44)
AST: 23 U/L (ref 15–41)
Albumin: 3.5 g/dL (ref 3.5–5.0)
Alkaline Phosphatase: 52 U/L (ref 38–126)
Anion gap: 6 (ref 5–15)
BUN: 12 mg/dL (ref 6–20)
CO2: 24 mmol/L (ref 22–32)
Calcium: 8.9 mg/dL (ref 8.9–10.3)
Chloride: 109 mmol/L (ref 98–111)
Creatinine, Ser: 1.13 mg/dL — ABNORMAL HIGH (ref 0.44–1.00)
GFR, Estimated: 57 mL/min — ABNORMAL LOW (ref >60)
Glucose, Bld: 106 mg/dL — ABNORMAL HIGH (ref 70–99)
Potassium: 4.3 mmol/L (ref 3.5–5.1)
Sodium: 139 mmol/L (ref 135–145)
Total Bilirubin: 0.5 mg/dL (ref 0.3–1.2)
Total Protein: 6.6 g/dL (ref 6.5–8.1)

## 2022-06-28 MED ORDER — SODIUM CHLORIDE 0.9 % IV BOLUS
1000.0000 mL | Freq: Once | INTRAVENOUS | Status: AC
  Administered 2022-06-28: 1000 mL via INTRAVENOUS

## 2022-06-28 NOTE — Progress Notes (Signed)
SBP is in the 80s this am with normal HR and no fever. Plan to give fluid bolus and check labs.

## 2022-06-28 NOTE — Progress Notes (Addendum)
1pm: CSW spoke with patient's mother Gregary Signs at bedside to obtain paperwork from [employer]. This paperwork is dated 04/2022 and it states patient was deemed to no longer be disabled and that funds were stopped.  CSW obtained copies of SSA documents and sent them to [place] of financial counseling for review and guidance on next steps.  11:25am: CSW received return call from patient's mother Gregary Signs. Gregary Signs states she has been in communication with Mervin Hack at [facility] and Gracy Racer of [facility]. Gregary Signs states she will come to the hospital this afternoon and CSW will meet her at bedside to discuss new paperwork that was sent by [facility].  8:20am: CSW attempted to reach patient's mother Gregary Signs without success - no voicemail option available.  Madilyn Fireman, MSW, LCSW Transitions of Care  Clinical Social Worker II [phone]

## 2022-06-28 NOTE — Progress Notes (Signed)
PROGRESS NOTE    Natalie Brown  [id number] DOB: 1965-12-23 DOA: 12/24/2021 PCP: Marliss Coots, NP   Brief Narrative:Natalie Brown is a 57 yo female with PMH bipolar disorder, HTN, recent hospitalization at [facility] who presents to the hospital with altered mentation and tachycardia. Major events: 09/07/2021 admitted to behavioral health.  Treated with Zyprexa Invega and Ativan challenge for catatonia. 12/24/2021 sent to ER from [facility] for evaluation for tachycardia 9/04-9/5, 9/5>9/9, LTM EEG  no seizure. TSH stable CTA chest no PE no pneumonia RPR nonreactive bilateral LE Doppler negative for DVT Echo with normal EF  9/23 febrile secondary to Covid 19 + no pneumonia. 9/25 repeat CT brain unremarkable. She has had prolonged hospitalization seen by psychiatry.Per psychiatry does not meet criteria to go back to behavioral health, awaiting for placement to memory care unit. 3/4 psychiatry consulted for increasing agitation   Subjective: Seen and examined, Overnight vitals events reviewed  Resting on bed eyes open no verba lresponse Sitter at bedside rechecked BP just now in 120s Labs pending   Assessment & Plan: Acute metabolic encephalopathy in the setting of chronic progressive dementia Intermittent agitation: initially admitted to behavioral health Hospital for aggressive behavior and transferred to [facility] for altered mental status with concern for catatonia>seen by psychiatry and neurology s/p MRI and EEG -unrevealing.  Labs ANA and negative June 2023.  Mental status remains at baseline,in which she does not participate much in conversation but ambulates freely during daytime-does not dyspneic.Intermittently agitated. 3/5:Psychiatry reconsulted for agitation to adjust medication as needed-see below.  Bipolar disorder with intermittent agitation: Currently on Haldol 2 mg twice daily, olanzapine 5 mg twice daily PRN or IM, Ativan 0.5 mg p.o. every 6 hours as needed, Atarax 25 3  times daily as needed.  Continue plan of care as per psychiatry continue bedside sitter  Hypoternsion- asymptomatic: s/p 1 liter bolus nss- recheck BP just now 120/62 > f/u labs  Essential hypertension: see above  CKD stage IIIb: Stable- fu labs today  Vitamin B12 deficiency- Cont vitamin B12 500 mcg p.o. daily Debility, deconditioning: Ambulating.    DVT prophylaxis: enoxaparin (LOVENOX) injection 40 mg Start: 12/26/21 1200    Code Status: Full Code Family Communication: No family present at bedside this morning  Disposition Plan:  Level of care: Med-Surg Status is: Inpatient Remains inpatient appropriate because: Medically stable to discharge to memory care unit once bed identified, further per [facility]    Consultants:  Psychiatry>> reconsulted 3/4 Neurology  Procedures:  EEG  Antimicrobials:  None  Objective: Vitals:   06/27/22 0757 06/27/22 1920 06/28/22 0159 06/28/22 0452  BP: 129/81 (!) 94/57 (!) 81/63 (!) 84/49  Pulse: (!) 110 73 (!) 54 61  Resp: '18 18  18  '$ Temp:    97.6 F (36.4 C)  TempSrc:    Oral  SpO2: 100% 100% 100% 100%  Weight:      Height:        Intake/Output Summary (Last 24 hours) at 06/28/2022 0727 Last data filed at 06/28/2022 [id number] Gross per 24 hour  Intake 1328.29 ml  Output 300 ml  Net 1028.29 ml     Filed Weights   02/03/22 2025  Weight: 72 kg    Examination: General exam: AA0x0, weak,older appearing HEENT:Oral mucosa moist, Ear/Nose WNL grossly, dentition normal. Respiratory system: bilaterally clear hed BS,no use of accessory muscle Cardiovascular system: S1 & S2 +, regular rate. Gastrointestinal system: Abdomen soft, NT,ND,BS+ Nervous System:Alert, awake, moving extremities and grossly nonfocal Extremities: LE  ankle edema , lower extremities warm Skin: No rashes,no icterus. MSK: Normal muscle bulk,tone, power   Data Reviewed: I have personally reviewed following labs and imaging studies  CBC: Recent Labs  Lab 06/24/22 0836  06/26/22 0816  WBC 4.6 5.1  HGB 14.2 13.8  HCT 43.3 40.1  MCV 88.5 86.1  PLT 310 311     Basic Metabolic Panel: Recent Labs  Lab 06/24/22 0836 06/26/22 0816  NA 140 141  K 5.2* 4.5  CL 106 107  CO2 21* 26  GLUCOSE 86 116*  BUN 19 12  CREATININE 1.29* 1.23*  CALCIUM 9.2 9.2   GFR: Estimated Creatinine Clearance: 52.5 mL/min (A) (by C-G formula based on SCr of 1.23 mg/dL (H)). Liver Function Tests: Recent Labs  Lab 06/24/22 0836 06/26/22 0816  AST 41 26  ALT 19 20  ALKPHOS 52 51  BILITOT 1.1 0.8  PROT 6.7 6.9  ALBUMIN 3.5 3.5   Radiology Studies: No results found. Scheduled Meds:  (feeding supplement) PROSource Plus  30 mL Oral BID BM   vitamin B-12  500 mcg Oral Daily   enoxaparin (LOVENOX) injection  40 mg Subcutaneous Q24H   feeding supplement (KATE FARMS STANDARD 1.4)  325 mL Oral BID BM   haloperidol  2 mg Oral BID   hydrocerin   Topical BID   Continuous Infusions:   LOS: 184 days  Time spent: 25 minutes spent on chart review, discussion with nursing staff, consultants, updating family and interview/physical exam; more than 50% of that time was spent in counseling and/or coordination of care.  Antonieta Pert, DO [facility] Available via Epic secure chat 7am-7pm After these hours, please refer to coverage provider listed on [email or web address] 06/28/2022, 7:27 AM

## 2022-06-29 MED ORDER — MIDODRINE HCL 5 MG PO TABS: 5.0000 mg | ORAL_TABLET | Freq: Every day | ORAL | Status: DC | PRN

## 2022-06-29 MED ORDER — MIDODRINE HCL 5 MG PO TABS: 5.0000 mg | ORAL_TABLET | Freq: Three times a day (TID) | ORAL | Status: DC

## 2022-06-29 NOTE — Progress Notes (Signed)
PROGRESS NOTE    Natalie Brown  [id number] DOB: June 06, 1965 DOA: 12/24/2021 PCP: Marliss Coots, NP   Brief Narrative:Natalie Brown is a 57 yo female with PMH bipolar disorder, HTN, recent hospitalization at [facility] who presents to the hospital with altered mentation and tachycardia. Major events: 09/07/2021 admitted to behavioral health.  Treated with Zyprexa Invega and Ativan challenge for catatonia. 12/24/2021 sent to ER from [facility] for evaluation for tachycardia 9/04-9/5, 9/5>9/9, LTM EEG  no seizure. TSH stable CTA chest no PE no pneumonia RPR nonreactive bilateral LE Doppler negative for DVT Echo with normal EF  9/23 febrile secondary to Covid 19 + no pneumonia. 9/25 repeat CT brain unremarkable. She has had prolonged hospitalization seen by psychiatry.Per psychiatry does not meet criteria to go back to behavioral health, awaiting for placement to memory care unit. 3/4 psychiatry consulted for increasing agitation   Subjective: Seen and examined, Overnight vitals/labs/events reviewed  Resting comfortably seated at the bedside, Blood pressure was low however on recheck improved  Assessment & Plan:  Acute metabolic encephalopathy in the setting of chronic progressive dementia Intermittent agitation: initially admitted to behavioral health Hospital for aggressive behavior and transferred to [facility] for altered mental status with concern for catatonia>seen by psychiatry and neurology s/p MRI and EEG -unrevealing.  Labs ANA and negative June 2023.  Mental status remains at baseline,in which she does not participate much in conversation but ambulates freely during daytime-does not dyspneic.Intermittently agitated.3/5:Psychiatry reconsulted for agitation to adjust medication as needed-see below.  Bipolar disorder with intermittent agitation: Continue current Haldol scheduled along with PRNs  olanzapine/Ativan/Atarax.  Continue sitter at the bedside continue psychiatry  plan  Hypotension- asymptomatic: Intermittently hypotensive.  Received 1 L bolus 3/8.  Labs relatively stable, added midodrine as needed.   Essential hypertension: see above  CKD stage IIIb: Stable Vitamin B12 deficiency- Cont vitamin B12 500 mcg p.o. daily Debility, deconditioning: Ambulating.    DVT prophylaxis: enoxaparin (LOVENOX) injection 40 mg Start: 12/26/21 1200    Code Status: Full Code Family Communication: No family present at bedside this morning  Disposition Plan:  Level of care: Med-Surg Status is: Inpatient Remains inpatient appropriate because: Medically stable to discharge to memory care unit once bed identified, further per [facility]    Consultants:  Psychiatry>> reconsulted 3/4 Neurology  Procedures:  EEG  Antimicrobials:  None  Objective: Vitals:   06/28/22 0452 06/28/22 0730 06/28/22 1909 06/29/22 0856  BP: (!) 84/49 120/62 (!) 88/56 (!) 125/98  Pulse: 61 90 61 (!) 106  Resp: '18 17 17 16  '$ Temp: 97.6 F (36.4 C) 97.7 F (36.5 C) 97.6 F (36.4 C) 97.9 F (36.6 C)  TempSrc: Oral  Oral Oral  SpO2: 100% 99% (!) 82%   Weight:      Height:        Intake/Output Summary (Last 24 hours) at 06/29/2022 1034 Last data filed at 06/28/2022 1431 Gross per 24 hour  Intake 595 ml  Output --  Net 595 ml     Filed Weights   02/03/22 2025  Weight: 72 kg    Examination: General exam: alert awake, nonverbal HEENT:Oral mucosa moist, Ear/Nose WNL grossly Respiratory system: Bilaterally clear BS, no use of accessory muscle Cardiovascular system: S1 & S2 +, No JVD. Gastrointestinal system: Abdomen soft,NT,ND, BS+ Nervous System: Alert, awake, moving extremities, Extremities: LE edema neg,distal peripheral pulses palpable.  Skin: No rashes,no icterus. MSK: Normal muscle bulk,tone, power   Data Reviewed: I have personally reviewed following labs and imaging  studies  CBC: Recent Labs  Lab 06/24/22 0836 06/26/22 0816 06/28/22 0956  WBC 4.6 5.1 4.4  HGB  14.2 13.8 14.0  HCT 43.3 40.1 42.6  MCV 88.5 86.1 89.3  PLT 310 311 296     Basic Metabolic Panel: Recent Labs  Lab 06/24/22 0836 06/26/22 0816 06/28/22 0956  NA 140 141 139  K 5.2* 4.5 4.3  CL 106 107 109  CO2 21* 26 24  GLUCOSE 86 116* 106*  BUN '19 12 12  '$ CREATININE 1.29* 1.23* 1.13*  CALCIUM 9.2 9.2 8.9   GFR: Estimated Creatinine Clearance: 57.2 mL/min (A) (by C-G formula based on SCr of 1.13 mg/dL (H)). Liver Function Tests: Recent Labs  Lab 06/24/22 0836 06/26/22 0816 06/28/22 0956  AST 41 26 23  ALT '19 20 20  '$ ALKPHOS 52 51 52  BILITOT 1.1 0.8 0.5  PROT 6.7 6.9 6.6  ALBUMIN 3.5 3.5 3.5   Radiology Studies: No results found. Scheduled Meds:  (feeding supplement) PROSource Plus  30 mL Oral BID BM   vitamin B-12  500 mcg Oral Daily   enoxaparin (LOVENOX) injection  40 mg Subcutaneous Q24H   feeding supplement (KATE FARMS STANDARD 1.4)  325 mL Oral BID BM   haloperidol  2 mg Oral BID   hydrocerin   Topical BID   Continuous Infusions:   LOS: 185 days  Time spent: 25 minutes spent on chart review, discussion with nursing staff, consultants, updating family and interview/physical exam; more than 50% of that time was spent in counseling and/or coordination of care.  Antonieta Pert, DO [facility] Available via Epic secure chat 7am-7pm After these hours, please refer to coverage provider listed on [email or web address] 06/29/2022, 10:34 AM

## 2022-06-30 NOTE — Progress Notes (Signed)
PROGRESS NOTE    Natalie Brown  [id number] DOB: 07-Oct-1965 DOA: 12/24/2021 PCP: Marliss Coots, NP   Brief Narrative:Ms. Steinbrink is a 57 yo female with PMH bipolar disorder, HTN, recent hospitalization at [facility] who presents to the hospital with altered mentation and tachycardia. Major events: 09/07/2021 admitted to behavioral health.  Treated with Zyprexa Invega and Ativan challenge for catatonia. 12/24/2021 sent to ER from [facility] for evaluation for tachycardia 9/04-9/5, 9/5>9/9, LTM EEG  no seizure. TSH stable CTA chest no PE no pneumonia RPR nonreactive bilateral LE Doppler negative for DVT Echo with normal EF  9/23 febrile secondary to Covid 19 + no pneumonia. 9/25 repeat CT brain unremarkable. She has had prolonged hospitalization seen by psychiatry.Per psychiatry does not meet criteria to go back to behavioral health, awaiting for placement to memory care unit. 3/4 psychiatry consulted for increasing agitation BP has been fluctuating specially in the morning while on the bed hypotensive asymptomatic improves spontaneously   Subjective: Seen and examined, Overnight vitals/labs events reviewed  BP stable and afebrile. Resting on bed ate some this am, sitter at bedside  Assessment & Plan:  Acute metabolic encephalopathy in the setting of chronic progressive dementia Intermittent agitation: initially admitted to behavioral health Hospital for aggressive behavior and transferred to [facility] for altered mental status with concern for catatonia>seen by psychiatry and neurology s/p MRI and EEG -unrevealing.  Labs ANA and negative June 2023.  Mental status remains at baseline,in which she does not participate much in conversation but ambulates freely during daytime-does not dyspneic.Intermittently agitated.3/5:Psychiatry reconsulted for agitation to adjust medication as needed-see below.  Bipolar disorder with intermittent agitation: Continue current Haldol scheduled along with  PRNs  olanzapine/Ativan/Atarax.  Continue sitter at the bedside continue psychiatry plan  Hypotension- asymptomatic: Intermittently hypotensive.  Received 1 L bolus 3/8.  Labs relatively stable, added midodrine as needed.   Essential hypertension: see above  CKD stage IIIb: Stable Vitamin B12 deficiency- Cont vitamin B12 500 mcg p.o. daily Debility, deconditioning: Ambulating.    DVT prophylaxis: enoxaparin (LOVENOX) injection 40 mg Start: 12/26/21 1200    Code Status: Full Code Family Communication: No family present at bedside this morning  Disposition Plan:  Level of care: Med-Surg Status is: Inpatient Remains inpatient appropriate because: Medically stable to discharge to memory care unit once bed identified, further per [facility]    Consultants:  Psychiatry>> reconsulted 3/4 Neurology  Procedures:  EEG  Antimicrobials:  None  Objective: Vitals:   06/28/22 1909 06/29/22 0856 06/29/22 1228 06/29/22 1924  BP: (!) 88/56 (!) 125/98 (!) 150/83 111/83  Pulse: 61 (!) 106 93 97  Resp: '17 16 18 18  '$ Temp: 97.6 F (36.4 C) 97.9 F (36.6 C) 98.3 F (36.8 C) (!) 97.3 F (36.3 C)  TempSrc: Oral Oral Oral Oral  SpO2: (!) 82%  100% 100%  Weight:      Height:       No intake or output data in the 24 hours ending 06/30/22 0850   Filed Weights   02/03/22 2025  Weight: 72 kg    Examination: General exam:sleepy, weak,older appearing HEENT:Oral mucosa moist, Ear/Nose WNL grossly, dentition normal. Respiratory system: bilaterally diminished BS, no use of accessory muscle Cardiovascular system: S1 & S2 +, regular rate. Gastrointestinal system: Abdomen soft,NT,ND,BS+ Nervous System:Alert, awake, moving extremities and grossly nonfocal Extremities: LE ankle edema neg , lower extremities warm Skin: No rashes,no icterus. MSK: Normal muscle bulk,tone, power    Data Reviewed: I have personally reviewed following labs  and imaging studies  CBC: Recent Labs  Lab 06/24/22 0836  06/26/22 0816 06/28/22 0956  WBC 4.6 5.1 4.4  HGB 14.2 13.8 14.0  HCT 43.3 40.1 42.6  MCV 88.5 86.1 89.3  PLT 310 311 0000000    Basic Metabolic Panel: Recent Labs  Lab 06/24/22 0836 06/26/22 0816 06/28/22 0956  NA 140 141 139  K 5.2* 4.5 4.3  CL 106 107 109  CO2 21* 26 24  GLUCOSE 86 116* 106*  BUN '19 12 12  '$ CREATININE 1.29* 1.23* 1.13*  CALCIUM 9.2 9.2 8.9  GFR: Estimated Creatinine Clearance: 57.2 mL/min (A) (by C-G formula based on SCr of 1.13 mg/dL (H)). Liver Function Tests: Recent Labs  Lab 06/24/22 0836 06/26/22 0816 06/28/22 0956  AST 41 26 23  ALT '19 20 20  '$ ALKPHOS 52 51 52  BILITOT 1.1 0.8 0.5  PROT 6.7 6.9 6.6  ALBUMIN 3.5 3.5 3.5  Radiology Studies: No results found. Scheduled Meds:  (feeding supplement) PROSource Plus  30 mL Oral BID BM   vitamin B-12  500 mcg Oral Daily   enoxaparin (LOVENOX) injection  40 mg Subcutaneous Q24H   feeding supplement (KATE FARMS STANDARD 1.4)  325 mL Oral BID BM   haloperidol  2 mg Oral BID   hydrocerin   Topical BID   Continuous Infusions:   LOS: 186 days  Time spent: 25 minutes spent on chart review, discussion with nursing staff, consultants, updating family and interview/physical exam; more than 50% of that time was spent in counseling and/or coordination of care.  Antonieta Pert, DO [facility] Available via Epic secure chat 7am-7pm After these hours, please refer to coverage provider listed on [email or web address] 06/30/2022, 8:50 AM

## 2022-07-01 NOTE — Progress Notes (Addendum)
Sitter completed shift  at 2300.  No relief sitter available.  Patient left unattended. Nurse informed Charged nurse that patient no longer has a sitter at the bedside. Patient has Physician order for bed side sitter.  Nurse will continue to monitor and treat patient.

## 2022-07-01 NOTE — Progress Notes (Signed)
CSW spoke with financial counseling to obtain guidance on reason for patient's stopped SSA payments.  CSW spoke with Dedra of [facility] who states she will notify CSW today with bed availability.  CSW spoke with patient's mother Gregary Signs to inform her of updates obtained from financial counseling. Gregary Signs states she will call Dedra to discuss information further.  Madilyn Fireman, MSW, LCSW Transitions of Care  Clinical Social Worker II [phone]

## 2022-07-01 NOTE — Progress Notes (Signed)
PROGRESS NOTE    Natalie Brown  [id number] DOB: 05-01-1965 DOA: 12/24/2021 PCP: Natalie Coots, NP   Brief Narrative:[name] is a 57 yo female with PMH bipolar disorder, HTN, recent hospitalization at [facility] who presents to the hospital with altered mentation and tachycardia. Major events: 09/07/2021 admitted to behavioral health.  Treated with Zyprexa Invega and Ativan challenge for catatonia. 12/24/2021 sent to ER from [facility] for evaluation for tachycardia 9/04-9/5, 9/5>9/9, LTM EEG  no seizure. TSH stable CTA chest no PE no pneumonia RPR nonreactive bilateral LE Doppler negative for DVT Echo with normal EF  9/23 febrile secondary to Covid 19 + no pneumonia. 9/25 repeat CT brain unremarkable. She has had prolonged hospitalization seen by psychiatry.Per psychiatry does not meet criteria to go back to behavioral health, awaiting for placement to memory care unit. 3/4 psychiatry consulted for increasing agitation BP has been fluctuating specially in the morning while on the bed hypotensive asymptomatic improves spontaneously   Subjective: Seen examined No sitter available last night No new complaints Non verbal  Assessment & Plan:  Acute metabolic encephalopathy in the setting of chronic progressive dementia Intermittent agitation: initially admitted to behavioral health Hospital for aggressive behavior and transferred to [facility] for altered mental status with concern for catatonia>seen by psychiatry and neurology s/p MRI and EEG -unrevealing.  Labs ANA and negative June 2023.  Mental status remains at baseline,in which she does not participate much in conversation but ambulates freely during daytime-does not dyspneic.Intermittently agitated.3/5:Psychiatry reconsulted for agitation to adjust medication as needed-see below.  Bipolar disorder with intermittent agitation: Continue current Haldol scheduled along with PRNs  olanzapine/Ativan/Atarax.  Continue sitter at the  bedside continue psychiatry plan  Hypotension- asymptomatic: Intermittently hypotensive.  Received 1 L bolus 3/8.  Labs relatively stable, added midodrine as needed.   Essential hypertension: see above  CKD stage IIIb: Stable Vitamin B12 deficiency- Cont vitamin B12 500 mcg p.o. daily Debility, deconditioning: Ambulating.    DVT prophylaxis: enoxaparin (LOVENOX) injection 40 mg Start: 12/26/21 1200    Code Status: Full Code Family Communication: No family present at bedside this morning  Disposition Plan:  Level of care: Med-Surg Status is: Inpatient Remains inpatient appropriate because: Medically stable to discharge to memory care unit once bed identified, further per [facility]   Consultants:  Psychiatry>> reconsulted 3/4 Neurology  Procedures:  EEG  Antimicrobials:  None  Objective: Vitals:   06/30/22 1028 06/30/22 2300 07/01/22 0200 07/01/22 0716  BP: 114/63 112/62 98/62 105/84  Pulse: 89 63 67 95  Resp: '18 16 16 18  '$ Temp: 98 F (36.7 C) 98.6 F (37 C) 97.6 F (36.4 C) 98.2 F (36.8 C)  TempSrc: Oral Oral Oral Oral  SpO2: 100% 95% 100% 100%  Weight:      Height:        Intake/Output Summary (Last 24 hours) at 07/01/2022 0734 Last data filed at 06/30/2022 1300 Gross per 24 hour  Intake 225 ml  Output --  Net 225 ml     Filed Weights   02/03/22 2025  Weight: 72 kg    Examination: General exam: AA, not speaking-non verbal HEENT:Oral mucosa moist, Ear/Nose WNL grossly, dentition normal. Respiratory system: bilaterally clear BS, no use of accessory muscle Cardiovascular system: S1 & S2 +, regular rate,. Gastrointestinal system: Abdomen soft, NT,ND,BS+ Nervous System:Alert, awake, moving extremities and grossly nonfocal Extremities: LE ankle edema neg, lower extremities warm Skin: No rashes,no icterus. MSK: Normal muscle bulk,tone, power    Data Reviewed: I  have personally reviewed following labs and imaging studies  CBC: Recent Labs  Lab  06/24/22 0836 06/26/22 0816 06/28/22 0956  WBC 4.6 5.1 4.4  HGB 14.2 13.8 14.0  HCT 43.3 40.1 42.6  MCV 88.5 86.1 89.3  PLT 310 311 296     Basic Metabolic Panel: Recent Labs  Lab 06/24/22 0836 06/26/22 0816 06/28/22 0956  NA 140 141 139  K 5.2* 4.5 4.3  CL 106 107 109  CO2 21* 26 24  GLUCOSE 86 116* 106*  BUN '19 12 12  '$ CREATININE 1.29* 1.23* 1.13*  CALCIUM 9.2 9.2 8.9   GFR: Estimated Creatinine Clearance: 57.2 mL/min (A) (by C-G formula based on SCr of 1.13 mg/dL (H)). Liver Function Tests: Recent Labs  Lab 06/24/22 0836 06/26/22 0816 06/28/22 0956  AST 41 26 23  ALT '19 20 20  '$ ALKPHOS 52 51 52  BILITOT 1.1 0.8 0.5  PROT 6.7 6.9 6.6  ALBUMIN 3.5 3.5 3.5   Radiology Studies: No results found. Scheduled Meds:  (feeding supplement) PROSource Plus  30 mL Oral BID BM   vitamin B-12  500 mcg Oral Daily   enoxaparin (LOVENOX) injection  40 mg Subcutaneous Q24H   feeding supplement (KATE FARMS STANDARD 1.4)  325 mL Oral BID BM   haloperidol  2 mg Oral BID   hydrocerin   Topical BID   Continuous Infusions:   LOS: 187 days  Time spent: 25 minutes spent on chart review, discussion with nursing staff, consultants, updating family and interview/physical exam; more than 50% of that time was spent in counseling and/or coordination of care.  Antonieta Pert, DO [facility] Available via Epic secure chat 7am-7pm After these hours, please refer to coverage provider listed on [email or web address] 07/01/2022, 7:34 AM

## 2022-07-01 NOTE — Consult Note (Addendum)
Psychiatry Brief Note  Attempted to reach out to legal guardian Truddie Hidden again today. No one answered and call went to voicemail. Did not see patient in person today. Patient would possibly benefit from switching from risperidone to paliperidone as paliperidone is a once a day dosing and she has done well with it in the past for her bipolar disorder. Unable to do switch as we have been unable to reach legal guardian for consent to switch.   Patient Active Problem List   Diagnosis Date Noted   B12 deficiency 02/19/2022   COVID-19 virus infection 02/19/2022   Drooling 02/19/2022   Essential hypertension 02/19/2022   Altered mental state 12/26/2021   Sinus tachycardia 12/25/2021   Tobacco abuse 12/24/2021   Delirium due to multiple etiologies, acute, hyperactive 12/24/2021   Early onset Dementia with behavioral disturbance ([place]) 11/11/2021   Bipolar affective disorder, current episode manic with psychotic symptoms ([place]) 09/12/2021   Dental caries 06/29/2015   Cervical cancer screening 02/22/2014   Current smoker 02/22/2014   Urge incontinence of urine 12/30/2013   Need for prophylactic vaccination and inoculation against influenza 12/30/2013   Bipolar 1 disorder ([place]) 12/30/2013    Recommendations: -Continue 1:1 as she seems to require frequent reorientation -Continue haldol 2 mg bid -Recommend switching from haldol to paliperidone to reduce risk of dystonia -Avoid IM haldol, geodon if possible  Signing off. Please re-consult if needed.  France Ravens, MD PGY2 Psychiatry Resident

## 2022-07-02 NOTE — Progress Notes (Addendum)
CSW received email from [place] at [name] who states there are no female beds available at this time.  CSW spoke with Jamas Lav of [facility] who states she does have a female bed available. CSW sent email with clinicals for review.  Madilyn Fireman, MSW, LCSW Transitions of Care  Clinical Social Worker II [phone]

## 2022-07-02 NOTE — Plan of Care (Signed)

## 2022-07-02 NOTE — Progress Notes (Signed)
PROGRESS NOTE Natalie Brown  [id number] DOB: 1965-11-16 DOA: 12/24/2021 PCP: Natalie Coots, NP  Brief Narrative:Natalie Brown is a 57 yo female with PMH bipolar disorder, HTN, recent hospitalization at [facility] who presents to the hospital with altered mentation and tachycardia. Major events: 09/07/2021 admitted to behavioral health.  Treated with Zyprexa Invega and Ativan challenge for catatonia. 12/24/2021 sent to ER from [facility] for evaluation for tachycardia 9/04-9/5, 9/5>9/9, LTM EEG  no seizure. TSH stable CTA chest no PE no pneumonia RPR nonreactive bilateral LE Doppler negative for DVT Echo with normal EF  9/23 febrile secondary to Covid 19 + no pneumonia. 9/25 repeat CT brain unremarkable. She has had prolonged hospitalization seen by psychiatry.Per psychiatry does not meet criteria to go back to behavioral health, awaiting for placement to memory care unit. 3/4 psychiatry consulted for increasing agitation BP has been fluctuating specially in the morning while on the bed hypotensive asymptomatic improves spontaneously   Subjective: Seen and examined, Overnight vitals/labs/events reviewed  Afebrile overnight Bp stable. Seen sitting on the bed confused does not communicate nonverbal, sitter at the bedside  Assessment & Plan:  Acute metabolic encephalopathy in the setting of chronic progressive dementia Intermittent agitation: initially admitted to behavioral health Hospital for aggressive behavior and transferred to [facility] for altered mental status with concern for catatonia>seen by psychiatry and neurology s/p MRI and EEG -unrevealing.  Labs ANA and negative June 2023.Mental status remains at baseline,in which she does not participate much in conversation but ambulates freely during daytime-does not dyspneic.Intermittently agitated.3/5:Psychiatry reconsulted for agitation to adjust medication as needed-see below.  Bipolar disorder with intermittent agitation: Continue  current Haldol scheduled along with PRNs  olanzapine/Ativan/Atarax.  Continue sitter at the bedside continue psychiatry plan- they are trying to reach family to consider switching psych meds-defer to psych-I asked psych to talk to [facility] re contact  Hypotension-resolved.Use Midodrine as needed.   Essential hypertension:see above CKD stage IIIb: Stable Vitamin B12 deficiency- Cont vitamin B12 Debility, deconditioning: Ambulating.    DVT prophylaxis: enoxaparin (LOVENOX) injection 40 mg Start: 12/26/21 1200    Code Status: Full Code Family Communication: No family present at bedside this morning  Disposition Plan:  Level of care: Med-Surg Status is: Inpatient Remains inpatient appropriate because: Medically stable to discharge to memory care unit once bed identified, further per [facility]   Consultants:  Psychiatry>> reconsulted 3/4 Neurology  Procedures:  EEG  Antimicrobials:  None  Objective: Vitals:   07/01/22 0716 07/01/22 1547 07/01/22 2200 07/02/22 0411  BP: 105/84 130/70  125/71  Pulse: 95   69  Resp: '18 18  15  '$ Temp: 98.2 F (36.8 C) 98.9 F (37.2 C)  97.8 F (36.6 C)  TempSrc: Oral Oral    SpO2: 100%  100%   Weight:      Height:        Intake/Output Summary (Last 24 hours) at 07/02/2022 0749 Last data filed at 07/01/2022 1028 Gross per 24 hour  Intake 50 ml  Output --  Net 50 ml      Filed Weights   02/03/22 2025  Weight: 72 kg    Examination: General exam: AA, weak,older appearing HEENT:Oral mucosa moist, Ear/Nose WNL grossly, dentition normal. Respiratory system: bilaterally diminished BS, no use of accessory muscle Cardiovascular system: S1 & S2 +, regular rate. Gastrointestinal system: Abdomen soft, NT,ND,BS+ Nervous System:Alert, awake, does not follow command does not communicate but ambulates Extremities: LE ankle edema neg, lower extremities warm Skin: No rashes,no icterus. MSK: Normal  muscle bulk,tone, power    Data Reviewed: I have  personally reviewed following labs and imaging studies  CBC: Recent Labs  Lab 06/26/22 0816 06/28/22 0956  WBC 5.1 4.4  HGB 13.8 14.0  HCT 40.1 42.6  MCV 86.1 89.3  PLT 311 296     Basic Metabolic Panel: Recent Labs  Lab 06/26/22 0816 06/28/22 0956  NA 141 139  K 4.5 4.3  CL 107 109  CO2 26 24  GLUCOSE 116* 106*  BUN 12 12  CREATININE 1.23* 1.13*  CALCIUM 9.2 8.9   GFR: Estimated Creatinine Clearance: 57.2 mL/min (A) (by C-G formula based on SCr of 1.13 mg/dL (H)). Liver Function Tests: Recent Labs  Lab 06/26/22 0816 06/28/22 0956  AST 26 23  ALT 20 20  ALKPHOS 51 52  BILITOT 0.8 0.5  PROT 6.9 6.6  ALBUMIN 3.5 3.5   Radiology Studies: No results found. Scheduled Meds:  (feeding supplement) PROSource Plus  30 mL Oral BID BM   vitamin B-12  500 mcg Oral Daily   enoxaparin (LOVENOX) injection  40 mg Subcutaneous Q24H   feeding supplement (KATE FARMS STANDARD 1.4)  325 mL Oral BID BM   haloperidol  2 mg Oral BID   hydrocerin   Topical BID   Continuous Infusions:   LOS: 188 days  Time spent: 25 minutes spent on chart review, discussion with nursing staff, consultants, updating family and interview/physical exam; more than 50% of that time was spent in counseling and/or coordination of care.  Antonieta Pert, DO [facility] Available via Epic secure chat 7am-7pm After these hours, please refer to coverage provider listed on [email or web address] 07/02/2022, 7:49 AM

## 2022-07-03 NOTE — Progress Notes (Signed)
PROGRESS NOTE  Natalie Brown [id number] DOB: June 18, 1965 DOA: 12/24/2021 PCP: Marliss Coots, NP   LOS: 189 days   Brief Narrative / Interim history: [name] is a 57 yo female with PMH bipolar disorder, HTN, recent hospitalization at [facility] who presents to the hospital with altered mentation and tachycardia.   Significant events: 09/07/2021 admitted to behavioral health.  Treated with Zyprexa Invega and Ativan challenge for catatonia. 12/24/2021 sent to ER from [facility] for evaluation for tachycardia 9/04-9/5, 9/5>9/9, LTM EEG  no seizure. TSH stable CTA chest no PE no pneumonia RPR nonreactive bilateral LE Doppler negative for DVT Echo with normal EF  9/23 febrile secondary to Covid 19 + no pneumonia. 9/25 repeat CT brain unremarkable. She has had prolonged hospitalization seen by psychiatry.Per psychiatry does not meet criteria to go back to behavioral health, awaiting for placement to memory care unit. 3/4 psychiatry consulted for increasing agitation BP has been fluctuating specially in the morning while on the bed hypotensive asymptomatic improves spontaneously  Subjective / 24h Interval events: She has not acknowledging me nor discussing with me or answer my questions.  Assesement and Plan: Principal Problem:   Early onset Dementia with behavioral disturbance ([place]) Active Problems:   Delirium due to multiple etiologies, acute, hyperactive   Bipolar affective disorder, current episode manic with psychotic symptoms ([place])   Sinus tachycardia   Tobacco abuse   Altered mental state   B12 deficiency   COVID-19 virus infection   Drooling   Essential hypertension   Principal problem Acute metabolic encephalopathy in the setting of chronic progressive dementia Intermittent agitation - initially admitted to behavioral health Hospital for aggressive behavior and transferred to [facility] for altered mental status with concern for catatonia>seen by psychiatry and neurology  s/p MRI and EEG -unrevealing.  Labs ANA and negative June 2023.Mental status remains at baseline,in which she does not participate much in conversation but ambulates freely during daytime-does not dyspneic.Intermittently agitated.3/5:Psychiatry reconsulted for agitation to adjust medication as needed-see below.   Active problems Bipolar disorder with intermittent agitation: Continue current Haldol scheduled along with PRNs  olanzapine/Ativan/Atarax.  Continue sitter at the bedside continue psychiatry plan- they are trying to reach family to consider switching psych meds-defer to psych-I asked psych to talk to [facility] re contact   Hypotension -resolved.Use Midodrine as needed.    Essential hypertension - see above  CKD stage IIIb - Stable  Vitamin B12 deficiency- Cont vitamin B12  Debility, deconditioning - Ambulating.    Scheduled Meds:  (feeding supplement) PROSource Plus  30 mL Oral BID BM   vitamin B-12  500 mcg Oral Daily   enoxaparin (LOVENOX) injection  40 mg Subcutaneous Q24H   feeding supplement (KATE FARMS STANDARD 1.4)  325 mL Oral BID BM   haloperidol  2 mg Oral BID   hydrocerin   Topical BID   Continuous Infusions: PRN Meds:.acetaminophen, alum & mag hydroxide-simeth, fluticasone, hydrOXYzine, LORazepam, magnesium hydroxide, metoprolol tartrate, OLANZapine **OR** OLANZapine, ondansetron, mouth rinse  Current Outpatient Medications  Medication Instructions   Claritin 10 mg, Oral, Daily   cyanocobalamin (VITAMIN B12) 500 mcg, Oral, Daily   fluticasone (FLONASE) 50 MCG/ACT nasal spray 1 spray, Each Nare, Daily   haloperidol (HALDOL) 1 mg, Oral, 2 times daily   hydrOXYzine (ATARAX) 25 mg, Oral, 3 times daily PRN   Incontinence Supply Disposable (BRIEFS OVERNIGHT MEDIUM) MISC 1 each, Does not apply, Every 4 hours PRN   LORazepam (ATIVAN) 0.5 mg, Oral, Every 6 hours PRN  OLANZapine zydis (ZYPREXA) 5 mg, Oral, Every 8 hours PRN   pseudoephedrine-guaifenesin (MUCINEX D) 60-600 MG  12 hr tablet 1 tablet, Oral, Every 12 hours    Diet Orders (From admission, onward)     Start     Ordered   05/23/22 1257  DIET DYS 3 Room service appropriate? Yes; Fluid consistency: Thin  Diet effective now       Comments: Patient is not on isolation precautions  Question Answer Comment  Room service appropriate? Yes   Fluid consistency: Thin      05/23/22 1257            DVT prophylaxis: enoxaparin (LOVENOX) injection 40 mg Start: 12/26/21 1200   Lab Results  Component Value Date   PLT 296 06/28/2022      Code Status: Full Code  Family Communication: no family at bedside   Status is: Inpatient  Remains inpatient appropriate because: severity of illness  Level of care: Med-Surg  Consultants:  Psychiatry   Objective: Vitals:   07/02/22 2055 07/02/22 2103 07/03/22 0527 07/03/22 0531  BP: (!) 93/29 112/70 102/79   Pulse: (!) 122 88 (!) 119 95  Resp:  16 18   Temp:  98.2 F (36.8 C) 98.9 F (37.2 C)   TempSrc:  Oral Oral   SpO2: 97%     Weight:      Height:       No intake or output data in the 24 hours ending 07/03/22 0950 Wt Readings from Last 3 Encounters:  02/03/22 72 kg  12/24/21 72 kg  09/05/21 72.6 kg    Examination:  Constitutional: NAD Respiratory: CTA Cardiovascular: RRR  Data Reviewed: I have independently reviewed following labs and imaging studies   CBC Recent Labs  Lab 06/28/22 0956  WBC 4.4  HGB 14.0  HCT 42.6  PLT 296  MCV 89.3  MCH 29.4  MCHC 32.9  RDW 14.0    Recent Labs  Lab 06/28/22 0956  NA 139  K 4.3  CL 109  CO2 24  GLUCOSE 106*  BUN 12  CREATININE 1.13*  CALCIUM 8.9  AST 23  ALT 20  ALKPHOS 52  BILITOT 0.5  ALBUMIN 3.5    ------------------------------------------------------------------------------------------------------------------ No results for input(s): "CHOL", "HDL", "LDLCALC", "TRIG", "CHOLHDL", "LDLDIRECT" in the last 72 hours.  Lab Results  Component Value Date   HGBA1C 5.3  09/08/2021   ------------------------------------------------------------------------------------------------------------------ No results for input(s): "TSH", "T4TOTAL", "T3FREE", "THYROIDAB" in the last 72 hours.  Invalid input(s): "FREET3"  Cardiac Enzymes No results for input(s): "CKMB", "TROPONINI", "MYOGLOBIN" in the last 168 hours.  Invalid input(s): "CK" ------------------------------------------------------------------------------------------------------------------ No results found for: "BNP"  CBG: No results for input(s): "GLUCAP" in the last 168 hours.  No results found for this or any previous visit (from the past 240 hour(s)).   Radiology Studies: No results found.   Marzetta Board, MD, PhD [facility]  Between 7 am - 7 pm I am available, please contact me via Amion (for emergencies) or Securechat (non urgent messages)  Between 7 pm - 7 am I am not available, please contact night coverage MD/APP via Amion

## 2022-07-03 NOTE — Progress Notes (Signed)
CSW spoke with Pamala Hurry at [facility] who states the facility has no bed availability at this time.  Madilyn Fireman, MSW, LCSW Transitions of Care  Clinical Social Worker II [phone]

## 2022-07-04 LAB — COMPREHENSIVE METABOLIC PANEL
ALT: 27 U/L (ref 0–44)
AST: 21 U/L (ref 15–41)
Albumin: 3.7 g/dL (ref 3.5–5.0)
Alkaline Phosphatase: 52 U/L (ref 38–126)
Anion gap: 10 (ref 5–15)
BUN: 16 mg/dL (ref 6–20)
CO2: 23 mmol/L (ref 22–32)
Calcium: 9.3 mg/dL (ref 8.9–10.3)
Chloride: 108 mmol/L (ref 98–111)
Creatinine, Ser: 1.18 mg/dL — ABNORMAL HIGH (ref 0.44–1.00)
GFR, Estimated: 55 mL/min — ABNORMAL LOW (ref >60)
Glucose, Bld: 101 mg/dL — ABNORMAL HIGH (ref 70–99)
Potassium: 4.5 mmol/L (ref 3.5–5.1)
Sodium: 141 mmol/L (ref 135–145)
Total Bilirubin: 0.8 mg/dL (ref 0.3–1.2)
Total Protein: 7 g/dL (ref 6.5–8.1)

## 2022-07-04 LAB — CBC
HCT: 43.8 % (ref 36.0–46.0)
Hemoglobin: 14.5 g/dL (ref 12.0–15.0)
MCH: 29.2 pg (ref 26.0–34.0)
MCHC: 33.1 g/dL (ref 30.0–36.0)
MCV: 88.3 fL (ref 80.0–100.0)
Platelets: 292 10*3/uL (ref 150–400)
RBC: 4.96 MIL/uL (ref 3.87–5.11)
RDW: 13.8 % (ref 11.5–15.5)
WBC: 5 10*3/uL (ref 4.0–10.5)
nRBC: 0 % (ref 0.0–0.2)

## 2022-07-04 LAB — MAGNESIUM: Magnesium: 2.1 mg/dL (ref 1.7–2.4)

## 2022-07-04 MED ORDER — SENNOSIDES-DOCUSATE SODIUM 8.6-50 MG PO TABS
2.0000 | ORAL_TABLET | Freq: Every day | ORAL | Status: DC
  Administered 2022-07-05 – 2023-01-10 (×127): 2 via ORAL
  Filled 2022-07-04 (×164): qty 2

## 2022-07-04 NOTE — Progress Notes (Signed)
PROGRESS NOTE  Natalie Brown [id number] DOB: June 03, 1965 DOA: 12/24/2021 PCP: Marliss Coots, NP   LOS: 190 days   Brief Narrative / Interim history: Ms. Kierce is a 57 yo female with PMH bipolar disorder, HTN, recent hospitalization at [facility] who presents to the hospital with altered mentation and tachycardia.   Significant events: 09/07/2021 admitted to behavioral health.  Treated with Zyprexa Invega and Ativan challenge for catatonia. 12/24/2021 sent to ER from [facility] for evaluation for tachycardia 9/04-9/5, 9/5>9/9, LTM EEG  no seizure. TSH stable CTA chest no PE no pneumonia RPR nonreactive bilateral LE Doppler negative for DVT Echo with normal EF  9/23 febrile secondary to Covid 19 + no pneumonia. 9/25 repeat CT brain unremarkable. She has had prolonged hospitalization seen by psychiatry.Per psychiatry does not meet criteria to go back to behavioral health, awaiting for placement to memory care unit. 3/4 psychiatry consulted for increasing agitation BP has been fluctuating specially in the morning while on the bed hypotensive asymptomatic improves spontaneously  Subjective / 24h Interval events: Nonverbal today.  Does not interact  Assesement and Plan: Principal Problem:   Early onset Dementia with behavioral disturbance ([place]) Active Problems:   Delirium due to multiple etiologies, acute, hyperactive   Bipolar affective disorder, current episode manic with psychotic symptoms ([place])   Sinus tachycardia   Tobacco abuse   Altered mental state   B12 deficiency   COVID-19 virus infection   Drooling   Essential hypertension   Principal problem Acute metabolic encephalopathy in the setting of chronic progressive dementia Intermittent agitation - initially admitted to behavioral health Hospital for aggressive behavior and transferred to [facility] for altered mental status with concern for catatonia>seen by psychiatry and neurology s/p MRI and EEG -unrevealing.  Labs ANA  and negative June 2023.Mental status remains at baseline,in which she does not participate much in conversation but ambulates freely during daytime-does not dyspneic.Intermittently agitated.3/5:Psychiatry reconsulted for agitation to adjust medication as needed-see below.   Active problems Bipolar disorder with intermittent agitation: Continue current Haldol scheduled along with PRNs  olanzapine/Ativan/Atarax.  Continue sitter at the bedside continue psychiatry plan- they are trying to reach family to consider switching psych meds-defer to psych-I asked psych to talk to [facility] re contact   Hypotension -resolved.Use Midodrine as needed.    Essential hypertension - see above  CKD stage IIIb - Stable  Vitamin B12 deficiency- Cont vitamin B12  Debility, deconditioning - Ambulating.    Scheduled Meds:  (feeding supplement) PROSource Plus  30 mL Oral BID BM   vitamin B-12  500 mcg Oral Daily   enoxaparin (LOVENOX) injection  40 mg Subcutaneous Q24H   feeding supplement (KATE FARMS STANDARD 1.4)  325 mL Oral BID BM   haloperidol  2 mg Oral BID   hydrocerin   Topical BID   senna-docusate  2 tablet Oral QHS   Continuous Infusions: PRN Meds:.acetaminophen, alum & mag hydroxide-simeth, fluticasone, hydrOXYzine, LORazepam, magnesium hydroxide, metoprolol tartrate, OLANZapine **OR** OLANZapine, ondansetron, mouth rinse  Current Outpatient Medications  Medication Instructions   Claritin 10 mg, Oral, Daily   cyanocobalamin (VITAMIN B12) 500 mcg, Oral, Daily   fluticasone (FLONASE) 50 MCG/ACT nasal spray 1 spray, Each Nare, Daily   haloperidol (HALDOL) 1 mg, Oral, 2 times daily   hydrOXYzine (ATARAX) 25 mg, Oral, 3 times daily PRN   Incontinence Supply Disposable (BRIEFS OVERNIGHT MEDIUM) MISC 1 each, Does not apply, Every 4 hours PRN   LORazepam (ATIVAN) 0.5 mg, Oral, Every 6 hours PRN  OLANZapine zydis (ZYPREXA) 5 mg, Oral, Every 8 hours PRN   pseudoephedrine-guaifenesin (MUCINEX D) 60-600 MG 12  hr tablet 1 tablet, Oral, Every 12 hours    Diet Orders (From admission, onward)     Start     Ordered   05/23/22 1257  DIET DYS 3 Room service appropriate? Yes; Fluid consistency: Thin  Diet effective now       Comments: Patient is not on isolation precautions  Question Answer Comment  Room service appropriate? Yes   Fluid consistency: Thin      05/23/22 1257            DVT prophylaxis: enoxaparin (LOVENOX) injection 40 mg Start: 12/26/21 1200   Lab Results  Component Value Date   PLT 292 07/04/2022      Code Status: Full Code  Family Communication: no family at bedside   Status is: Inpatient  Remains inpatient appropriate because: severity of illness  Level of care: Med-Surg  Consultants:  Psychiatry   Objective: Vitals:   07/02/22 2055 07/02/22 2103 07/03/22 0527 07/03/22 0531  BP: (!) 93/29 112/70 102/79   Pulse: (!) 122 88 (!) 119 95  Resp:  16 18   Temp:  98.2 F (36.8 C) 98.9 F (37.2 C)   TempSrc:  Oral Oral   SpO2: 97%     Weight:      Height:       No intake or output data in the 24 hours ending 07/04/22 1112 Wt Readings from Last 3 Encounters:  02/03/22 72 kg  12/24/21 72 kg  09/05/21 72.6 kg    Examination:  Constitutional: NAD Respiratory: CTA Cardiovascular: RRR  Data Reviewed: I have independently reviewed following labs and imaging studies   CBC Recent Labs  Lab 06/28/22 0956 07/04/22 0801  WBC 4.4 5.0  HGB 14.0 14.5  HCT 42.6 43.8  PLT 296 292  MCV 89.3 88.3  MCH 29.4 29.2  MCHC 32.9 33.1  RDW 14.0 13.8     Recent Labs  Lab 06/28/22 0956 07/04/22 0801  NA 139 141  K 4.3 4.5  CL 109 108  CO2 24 23  GLUCOSE 106* 101*  BUN 12 16  CREATININE 1.13* 1.18*  CALCIUM 8.9 9.3  AST 23 21  ALT 20 27  ALKPHOS 52 52  BILITOT 0.5 0.8  ALBUMIN 3.5 3.7  MG  --  2.1     ------------------------------------------------------------------------------------------------------------------ No results for input(s):  "CHOL", "HDL", "LDLCALC", "TRIG", "CHOLHDL", "LDLDIRECT" in the last 72 hours.  Lab Results  Component Value Date   HGBA1C 5.3 09/08/2021   ------------------------------------------------------------------------------------------------------------------ No results for input(s): "TSH", "T4TOTAL", "T3FREE", "THYROIDAB" in the last 72 hours.  Invalid input(s): "FREET3"  Cardiac Enzymes No results for input(s): "CKMB", "TROPONINI", "MYOGLOBIN" in the last 168 hours.  Invalid input(s): "CK" ------------------------------------------------------------------------------------------------------------------ No results found for: "BNP"  CBG: No results for input(s): "GLUCAP" in the last 168 hours.  No results found for this or any previous visit (from the past 240 hour(s)).   Radiology Studies: No results found.   Marzetta Board, MD, PhD [facility]  Between 7 am - 7 pm I am available, please contact me via Amion (for emergencies) or Securechat (non urgent messages)  Between 7 pm - 7 am I am not available, please contact night coverage MD/APP via Amion

## 2022-07-04 NOTE — Progress Notes (Signed)
Initial Nutrition Assessment  DOCUMENTATION CODES:   Not applicable  INTERVENTION:  - Continue [employer] 1.4 PO BID, each supplement provides 455 kcal and 20 grams protein   - Continue Prosource Plus.   - Consider scheduled bowel regimen.   NUTRITION DIAGNOSIS:   Altered GI function related to constipation as evidenced by other (comment) (last BM 10 days ago).  GOAL:   Other (Comment) (more frequent bowel movements)  MONITOR:   I & O's  REASON FOR ASSESSMENT:   LOS    ASSESSMENT:   57 y.o. female admits related to AMS. PMH includes: Bipolar disorde rand HTN. Pt is currently receiving medical management related to early onset dementia with behavioral disturbances.  Meds reviewed:  Vitamin B12. Labs reviewed.   Pt unable to communicate. Per record, pt has been eating mostly 50-100% of her meals since admission. RD asked RN to gather new weight because last documented weight was on 02/03/22. The pt also has not had a bowel movement documented since the 4th. Pt has PRN colace ordered. Will inquire with MD about scheduled bowel regimen.   NUTRITION - FOCUSED PHYSICAL EXAM:  Not appropriate for NFPE at this time.   Diet Order:   Diet Order             DIET DYS 3 Room service appropriate? Yes; Fluid consistency: Thin  Diet effective now                   EDUCATION NEEDS:   Not appropriate for education at this time  Skin:  Skin Assessment: Reviewed RN Assessment  Last BM:  3/4  Height:   Ht Readings from Last 1 Encounters:  02/03/22 '5\' 6"'$  (1.676 m)    Weight:   Wt Readings from Last 1 Encounters:  02/03/22 72 kg    Ideal Body Weight:     BMI:  Body mass index is 25.62 kg/m.  Estimated Nutritional Needs:   Kcal:  1800-2160 kcals  Protein:  90-105 gm  Fluid:  >/= 1.8 L  Thalia Bloodgood, RD, LDN, CNSC.

## 2022-07-05 NOTE — Progress Notes (Signed)
PROGRESS NOTE  Natalie Brown [id number] DOB: 02/08/1966 DOA: 12/24/2021 PCP: Marliss Coots, NP   LOS: 191 days   Brief Narrative / Interim history: Natalie Brown is a 57 yo female with PMH bipolar disorder, HTN, recent hospitalization at [name] who presents to the hospital with altered mentation and tachycardia.   Significant events: 09/07/2021 admitted to behavioral health.  Treated with Zyprexa Invega and Ativan challenge for catatonia. 12/24/2021 sent to ER from [facility] for evaluation for tachycardia 9/04-9/5, 9/5>9/9, LTM EEG  no seizure. TSH stable CTA chest no PE no pneumonia RPR nonreactive bilateral LE Doppler negative for DVT Echo with normal EF  9/23 febrile secondary to Covid 19 + no pneumonia. 9/25 repeat CT brain unremarkable. She has had prolonged hospitalization seen by psychiatry.Per psychiatry does not meet criteria to go back to behavioral health, awaiting for placement to memory care unit. 3/4 psychiatry consulted for increasing agitation BP has been fluctuating specially in the morning while on the bed hypotensive asymptomatic improves spontaneously  Subjective / 24h Interval events: Remains non interactive  Assesement and Plan: Principal Problem:   Early onset Dementia with behavioral disturbance ([place]) Active Problems:   Delirium due to multiple etiologies, acute, hyperactive   Bipolar affective disorder, current episode manic with psychotic symptoms ([place])   Sinus tachycardia   Tobacco abuse   Altered mental state   B12 deficiency   COVID-19 virus infection   Drooling   Essential hypertension   Principal problem Acute metabolic encephalopathy in the setting of chronic progressive dementia Intermittent agitation - initially admitted to behavioral health Hospital for aggressive behavior and transferred to [facility] for altered mental status with concern for catatonia>seen by psychiatry and neurology s/p MRI and EEG -unrevealing.  Labs ANA and negative  June 2023.Mental status remains at baseline,in which she does not participate much in conversation but ambulates freely during daytime-does not dyspneic.Intermittently agitated.3/5:Psychiatry reconsulted for agitation to adjust medication as needed-see below.   Active problems Bipolar disorder with intermittent agitation: Continue current Haldol scheduled along with PRNs  olanzapine/Ativan/Atarax.  Continue sitter at the bedside continue psychiatry plan- they are trying to reach family to consider switching psych meds-defer to psych-I asked psych to talk to [facility] re contact   Hypotension -resolved.Use Midodrine as needed.    Essential hypertension - see above  CKD stage IIIb - Stable  Vitamin B12 deficiency- Cont vitamin B12  Debility, deconditioning - Ambulating.    Scheduled Meds:  (feeding supplement) PROSource Plus  30 mL Oral BID BM   vitamin B-12  500 mcg Oral Daily   enoxaparin (LOVENOX) injection  40 mg Subcutaneous Q24H   feeding supplement (KATE FARMS STANDARD 1.4)  325 mL Oral BID BM   haloperidol  2 mg Oral BID   hydrocerin   Topical BID   senna-docusate  2 tablet Oral QHS   Continuous Infusions: PRN Meds:.acetaminophen, alum & mag hydroxide-simeth, fluticasone, hydrOXYzine, LORazepam, magnesium hydroxide, metoprolol tartrate, OLANZapine **OR** OLANZapine, ondansetron, mouth rinse  Current Outpatient Medications  Medication Instructions   Claritin 10 mg, Oral, Daily   cyanocobalamin (VITAMIN B12) 500 mcg, Oral, Daily   fluticasone (FLONASE) 50 MCG/ACT nasal spray 1 spray, Each Nare, Daily   haloperidol (HALDOL) 1 mg, Oral, 2 times daily   hydrOXYzine (ATARAX) 25 mg, Oral, 3 times daily PRN   Incontinence Supply Disposable (BRIEFS OVERNIGHT MEDIUM) MISC 1 each, Does not apply, Every 4 hours PRN   LORazepam (ATIVAN) 0.5 mg, Oral, Every 6 hours PRN   OLANZapine  zydis (ZYPREXA) 5 mg, Oral, Every 8 hours PRN   pseudoephedrine-guaifenesin (MUCINEX D) 60-600 MG 12 hr tablet 1  tablet, Oral, Every 12 hours    Diet Orders (From admission, onward)     Start     Ordered   05/23/22 1257  DIET DYS 3 Room service appropriate? Yes; Fluid consistency: Thin  Diet effective now       Comments: Patient is not on isolation precautions  Question Answer Comment  Room service appropriate? Yes   Fluid consistency: Thin      05/23/22 1257            DVT prophylaxis: enoxaparin (LOVENOX) injection 40 mg Start: 12/26/21 1200   Lab Results  Component Value Date   PLT 292 07/04/2022      Code Status: Full Code  Family Communication: no family at bedside   Status is: Inpatient  Remains inpatient appropriate because: severity of illness  Level of care: Med-Surg  Consultants:  Psychiatry   Objective: Vitals:   07/03/22 0527 07/03/22 0531 07/04/22 1100 07/05/22 0708  BP: 102/79   (!) 145/80  Pulse: (!) 119 95  (!) 109  Resp: 18   18  Temp: 98.9 F (37.2 C)     TempSrc: Oral     SpO2:    99%  Weight:   60.5 kg   Height:        Intake/Output Summary (Last 24 hours) at 07/05/2022 1101 Last data filed at 07/04/2022 1426 Gross per 24 hour  Intake 237 ml  Output --  Net 237 ml   Wt Readings from Last 3 Encounters:  07/04/22 60.5 kg  12/24/21 72 kg  09/05/21 72.6 kg    Examination:  Constitutional: NAD  Data Reviewed: I have independently reviewed following labs and imaging studies   CBC Recent Labs  Lab 07/04/22 0801  WBC 5.0  HGB 14.5  HCT 43.8  PLT 292  MCV 88.3  MCH 29.2  MCHC 33.1  RDW 13.8     Recent Labs  Lab 07/04/22 0801  NA 141  K 4.5  CL 108  CO2 23  GLUCOSE 101*  BUN 16  CREATININE 1.18*  CALCIUM 9.3  AST 21  ALT 27  ALKPHOS 52  BILITOT 0.8  ALBUMIN 3.7  MG 2.1     ------------------------------------------------------------------------------------------------------------------ No results for input(s): "CHOL", "HDL", "LDLCALC", "TRIG", "CHOLHDL", "LDLDIRECT" in the last 72 hours.  Lab Results   Component Value Date   HGBA1C 5.3 09/08/2021   ------------------------------------------------------------------------------------------------------------------ No results for input(s): "TSH", "T4TOTAL", "T3FREE", "THYROIDAB" in the last 72 hours.  Invalid input(s): "FREET3"  Cardiac Enzymes No results for input(s): "CKMB", "TROPONINI", "MYOGLOBIN" in the last 168 hours.  Invalid input(s): "CK" ------------------------------------------------------------------------------------------------------------------ No results found for: "BNP"  CBG: No results for input(s): "GLUCAP" in the last 168 hours.  No results found for this or any previous visit (from the past 240 hour(s)).   Radiology Studies: No results found.   Marzetta Board, MD, PhD [facility]  Between 7 am - 7 pm I am available, please contact me via Amion (for emergencies) or Securechat (non urgent messages)  Between 7 pm - 7 am I am not available, please contact night coverage MD/APP via Amion

## 2022-07-05 NOTE — Progress Notes (Signed)
CSW spoke with patient's mother to discuss updates. CSW informed Gregary Signs that the bed offer from [facility] was no longer available. Gregary Signs expressed gratitude for nursing staff and their willingness to provide patient with great care.  Madilyn Fireman, MSW, LCSW Transitions of Care  Clinical Social Worker II [phone]

## 2022-07-06 NOTE — Progress Notes (Signed)
PROGRESS NOTE  Natalie Brown [id number] DOB: 1965/08/03 DOA: 12/24/2021 PCP: Marliss Coots, NP   LOS: 192 days   Brief Narrative / Interim history: Natalie Brown is a 57 yo female with PMH bipolar disorder, HTN, recent hospitalization at [facility] who presents to the hospital with altered mentation and tachycardia.   Significant events: 09/07/2021 admitted to behavioral health.  Treated with Zyprexa Invega and Ativan challenge for catatonia. 12/24/2021 sent to ER from [facility] for evaluation for tachycardia 9/04-9/5, 9/5>9/9, LTM EEG  no seizure. TSH stable CTA chest no PE no pneumonia RPR nonreactive bilateral LE Doppler negative for DVT Echo with normal EF  9/23 febrile secondary to Covid 19 + no pneumonia. 9/25 repeat CT brain unremarkable. She has had prolonged hospitalization seen by psychiatry.Per psychiatry does not meet criteria to go back to behavioral health, awaiting for placement to memory care unit. 3/4 psychiatry consulted for increasing agitation BP has been fluctuating specially in the morning while on the bed hypotensive asymptomatic improves spontaneously  Subjective / 24h Interval events: Alert, noninteractive  Assesement and Plan: Principal Problem:   Early onset Dementia with behavioral disturbance ([place]) Active Problems:   Delirium due to multiple etiologies, acute, hyperactive   Bipolar affective disorder, current episode manic with psychotic symptoms ([place])   Sinus tachycardia   Tobacco abuse   Altered mental state   B12 deficiency   COVID-19 virus infection   Drooling   Essential hypertension   Principal problem Acute metabolic encephalopathy in the setting of chronic progressive dementia Intermittent agitation - initially admitted to behavioral health Hospital for aggressive behavior and transferred to [facility] for altered mental status with concern for catatonia>seen by psychiatry and neurology s/p MRI and EEG -unrevealing.  Labs ANA and negative  June 2023.Mental status remains at baseline,in which she does not participate much in conversation but ambulates freely during daytime-does not dyspneic.Intermittently agitated.3/5:Psychiatry reconsulted for agitation to adjust medication as needed-see below.   Active problems Bipolar disorder with intermittent agitation: Continue current Haldol scheduled along with PRNs  olanzapine/Ativan/Atarax.  Continue sitter at the bedside continue psychiatry plan- they are trying to reach family to consider switching psych meds-defer to psych-I asked psych to talk to [facility] re contact   Hypotension -resolved.Use Midodrine as needed.    Essential hypertension - see above  CKD stage IIIb - Stable  Vitamin B12 deficiency- Cont vitamin B12  Debility, deconditioning - Ambulating.    Scheduled Meds:  (feeding supplement) PROSource Plus  30 mL Oral BID BM   vitamin B-12  500 mcg Oral Daily   enoxaparin (LOVENOX) injection  40 mg Subcutaneous Q24H   feeding supplement (KATE FARMS STANDARD 1.4)  325 mL Oral BID BM   haloperidol  2 mg Oral BID   hydrocerin   Topical BID   senna-docusate  2 tablet Oral QHS   Continuous Infusions: PRN Meds:.acetaminophen, alum & mag hydroxide-simeth, fluticasone, hydrOXYzine, LORazepam, magnesium hydroxide, metoprolol tartrate, OLANZapine **OR** OLANZapine, ondansetron, mouth rinse  Current Outpatient Medications  Medication Instructions   Claritin 10 mg, Oral, Daily   cyanocobalamin (VITAMIN B12) 500 mcg, Oral, Daily   fluticasone (FLONASE) 50 MCG/ACT nasal spray 1 spray, Each Nare, Daily   haloperidol (HALDOL) 1 mg, Oral, 2 times daily   hydrOXYzine (ATARAX) 25 mg, Oral, 3 times daily PRN   Incontinence Supply Disposable (BRIEFS OVERNIGHT MEDIUM) MISC 1 each, Does not apply, Every 4 hours PRN   LORazepam (ATIVAN) 0.5 mg, Oral, Every 6 hours PRN   OLANZapine zydis (  ZYPREXA) 5 mg, Oral, Every 8 hours PRN   pseudoephedrine-guaifenesin (MUCINEX D) 60-600 MG 12 hr tablet 1  tablet, Oral, Every 12 hours    Diet Orders (From admission, onward)     Start     Ordered   05/23/22 1257  DIET DYS 3 Room service appropriate? Yes; Fluid consistency: Thin  Diet effective now       Comments: Patient is not on isolation precautions  Question Answer Comment  Room service appropriate? Yes   Fluid consistency: Thin      05/23/22 1257            DVT prophylaxis: enoxaparin (LOVENOX) injection 40 mg Start: 12/26/21 1200   Lab Results  Component Value Date   PLT 292 07/04/2022      Code Status: Full Code  Family Communication: no family at bedside   Status is: Inpatient  Remains inpatient appropriate because: severity of illness  Level of care: Med-Surg  Consultants:  Psychiatry   Objective: Vitals:   07/05/22 1448 07/05/22 2025 07/06/22 0529 07/06/22 0800  BP: 138/85 (!) 130/102 121/80 124/70  Pulse: (!) 109 98 (!) 109 (!) 109  Resp: 18 16 18 17   Temp: 97.7 F (36.5 C) 98.2 F (36.8 C) 98.4 F (36.9 C) 98.3 F (36.8 C)  TempSrc: Oral Oral Oral Oral  SpO2: 100% 93% 100% 100%  Weight:      Height:        Intake/Output Summary (Last 24 hours) at 07/06/2022 1042 Last data filed at 07/06/2022 0756 Gross per 24 hour  Intake 654 ml  Output --  Net 654 ml    Wt Readings from Last 3 Encounters:  07/04/22 60.5 kg  12/24/21 72 kg  09/05/21 72.6 kg    Examination:  Constitutional: NAD  Data Reviewed: I have independently reviewed following labs and imaging studies   CBC Recent Labs  Lab 07/04/22 0801  WBC 5.0  HGB 14.5  HCT 43.8  PLT 292  MCV 88.3  MCH 29.2  MCHC 33.1  RDW 13.8     Recent Labs  Lab 07/04/22 0801  NA 141  K 4.5  CL 108  CO2 23  GLUCOSE 101*  BUN 16  CREATININE 1.18*  CALCIUM 9.3  AST 21  ALT 27  ALKPHOS 52  BILITOT 0.8  ALBUMIN 3.7  MG 2.1     ------------------------------------------------------------------------------------------------------------------ No results for input(s): "CHOL",  "HDL", "LDLCALC", "TRIG", "CHOLHDL", "LDLDIRECT" in the last 72 hours.  Lab Results  Component Value Date   HGBA1C 5.3 09/08/2021   ------------------------------------------------------------------------------------------------------------------ No results for input(s): "TSH", "T4TOTAL", "T3FREE", "THYROIDAB" in the last 72 hours.  Invalid input(s): "FREET3"  Cardiac Enzymes No results for input(s): "CKMB", "TROPONINI", "MYOGLOBIN" in the last 168 hours.  Invalid input(s): "CK" ------------------------------------------------------------------------------------------------------------------ No results found for: "BNP"  CBG: No results for input(s): "GLUCAP" in the last 168 hours.  No results found for this or any previous visit (from the past 240 hour(s)).   Radiology Studies: No results found.   Marzetta Board, MD, PhD [facility]  Between 7 am - 7 pm I am available, please contact me via Amion (for emergencies) or Securechat (non urgent messages)  Between 7 pm - 7 am I am not available, please contact night coverage MD/APP via Amion

## 2022-07-07 NOTE — Progress Notes (Signed)
PROGRESS NOTE  [name] DOB: Sep 03, 1965 DOA: 12/24/2021 PCP: Marliss Coots, NP   LOS: 193 days   Brief Narrative / Interim history: [name] is a 57 yo female with PMH bipolar disorder, HTN, recent hospitalization at [facility] who presents to the hospital with altered mentation and tachycardia.   Significant events: 09/07/2021 admitted to behavioral health.  Treated with Zyprexa Invega and Ativan challenge for catatonia. 12/24/2021 sent to ER from [facility] for evaluation for tachycardia 9/04-9/5, 9/5>9/9, LTM EEG  no seizure. TSH stable CTA chest no PE no pneumonia RPR nonreactive bilateral LE Doppler negative for DVT Echo with normal EF  9/23 febrile secondary to Covid 19 + no pneumonia. 9/25 repeat CT brain unremarkable. She has had prolonged hospitalization seen by psychiatry.Per psychiatry does not meet criteria to go back to behavioral health, awaiting for placement to memory care unit. 3/4 psychiatry consulted for increasing agitation BP has been fluctuating specially in the morning while on the bed hypotensive asymptomatic improves spontaneously  Subjective / 24h Interval events: Mumbles few words but unintelligible.  Alert  Assesement and Plan: Principal Problem:   Early onset Dementia with behavioral disturbance ([place]) Active Problems:   Delirium due to multiple etiologies, acute, hyperactive   Bipolar affective disorder, current episode manic with psychotic symptoms ([place])   Sinus tachycardia   Tobacco abuse   Altered mental state   B12 deficiency   COVID-19 virus infection   Drooling   Essential hypertension   Principal problem Acute metabolic encephalopathy in the setting of chronic progressive dementia Intermittent agitation - initially admitted to behavioral health Hospital for aggressive behavior and transferred to [facility] for altered mental status with concern for catatonia>seen by psychiatry and neurology s/p MRI and EEG -unrevealing.   Labs ANA and negative June 2023.Mental status remains at baseline,in which she does not participate much in conversation but ambulates freely during daytime-does not dyspneic.Intermittently agitated.3/5:Psychiatry reconsulted for agitation to adjust medication as needed-see below.   Active problems Bipolar disorder with intermittent agitation: Continue current Haldol scheduled along with PRNs  olanzapine/Ativan/Atarax.  Continue sitter at the bedside continue psychiatry plan- they are trying to reach family to consider switching psych meds-defer to psych-I asked psych to talk to [facility] re contact   Hypotension -resolved.Use Midodrine as needed.    Essential hypertension - see above  CKD stage IIIb - Stable  Vitamin B12 deficiency- Cont vitamin B12  Debility, deconditioning - Ambulating.    Scheduled Meds:  (feeding supplement) PROSource Plus  30 mL Oral BID BM   vitamin B-12  500 mcg Oral Daily   enoxaparin (LOVENOX) injection  40 mg Subcutaneous Q24H   feeding supplement (KATE FARMS STANDARD 1.4)  325 mL Oral BID BM   haloperidol  2 mg Oral BID   hydrocerin   Topical BID   senna-docusate  2 tablet Oral QHS   Continuous Infusions: PRN Meds:.acetaminophen, alum & mag hydroxide-simeth, fluticasone, hydrOXYzine, LORazepam, magnesium hydroxide, metoprolol tartrate, OLANZapine **OR** OLANZapine, ondansetron, mouth rinse  Current Outpatient Medications  Medication Instructions   Claritin 10 mg, Oral, Daily   cyanocobalamin (VITAMIN B12) 500 mcg, Oral, Daily   fluticasone (FLONASE) 50 MCG/ACT nasal spray 1 spray, Each Nare, Daily   haloperidol (HALDOL) 1 mg, Oral, 2 times daily   hydrOXYzine (ATARAX) 25 mg, Oral, 3 times daily PRN   Incontinence Supply Disposable (BRIEFS OVERNIGHT MEDIUM) MISC 1 each, Does not apply, Every 4 hours PRN   LORazepam (ATIVAN) 0.5 mg, Oral, Every 6 hours  PRN   OLANZapine zydis (ZYPREXA) 5 mg, Oral, Every 8 hours PRN   pseudoephedrine-guaifenesin (MUCINEX D)  60-600 MG 12 hr tablet 1 tablet, Oral, Every 12 hours    Diet Orders (From admission, onward)     Start     Ordered   05/23/22 1257  DIET DYS 3 Room service appropriate? Yes; Fluid consistency: Thin  Diet effective now       Comments: Patient is not on isolation precautions  Question Answer Comment  Room service appropriate? Yes   Fluid consistency: Thin      05/23/22 1257            DVT prophylaxis: enoxaparin (LOVENOX) injection 40 mg Start: 12/26/21 1200   Lab Results  Component Value Date   PLT 292 07/04/2022      Code Status: Full Code  Family Communication: no family at bedside   Status is: Inpatient  Remains inpatient appropriate because: severity of illness  Level of care: Med-Surg  Consultants:  Psychiatry   Objective: Vitals:   07/06/22 0529 07/06/22 0800 07/06/22 2300 07/07/22 0829  BP: 121/80 124/70 109/68 116/68  Pulse: (!) 109 (!) 109 85 100  Resp: 18 17 18 20   Temp: 98.4 F (36.9 C) 98.3 F (36.8 C) (!) 97.5 F (36.4 C) 98.9 F (37.2 C)  TempSrc: Oral Oral Oral Oral  SpO2: 100% 100%  100%  Weight:      Height:        Intake/Output Summary (Last 24 hours) at 07/07/2022 1109 Last data filed at 07/07/2022 0830 Gross per 24 hour  Intake 262 ml  Output --  Net 262 ml    Wt Readings from Last 3 Encounters:  07/04/22 60.5 kg  12/24/21 72 kg  09/05/21 72.6 kg    Examination:  Constitutional: NAD  Data Reviewed: I have independently reviewed following labs and imaging studies   CBC Recent Labs  Lab 07/04/22 0801  WBC 5.0  HGB 14.5  HCT 43.8  PLT 292  MCV 88.3  MCH 29.2  MCHC 33.1  RDW 13.8     Recent Labs  Lab 07/04/22 0801  NA 141  K 4.5  CL 108  CO2 23  GLUCOSE 101*  BUN 16  CREATININE 1.18*  CALCIUM 9.3  AST 21  ALT 27  ALKPHOS 52  BILITOT 0.8  ALBUMIN 3.7  MG 2.1     ------------------------------------------------------------------------------------------------------------------ No results for  input(s): "CHOL", "HDL", "LDLCALC", "TRIG", "CHOLHDL", "LDLDIRECT" in the last 72 hours.  Lab Results  Component Value Date   HGBA1C 5.3 09/08/2021   ------------------------------------------------------------------------------------------------------------------ No results for input(s): "TSH", "T4TOTAL", "T3FREE", "THYROIDAB" in the last 72 hours.  Invalid input(s): "FREET3"  Cardiac Enzymes No results for input(s): "CKMB", "TROPONINI", "MYOGLOBIN" in the last 168 hours.  Invalid input(s): "CK" ------------------------------------------------------------------------------------------------------------------ No results found for: "BNP"  CBG: No results for input(s): "GLUCAP" in the last 168 hours.  No results found for this or any previous visit (from the past 240 hour(s)).   Radiology Studies: No results found.   Marzetta Board, MD, PhD [facility]  Between 7 am - 7 pm I am available, please contact me via Amion (for emergencies) or Securechat (non urgent messages)  Between 7 pm - 7 am I am not available, please contact night coverage MD/APP via Amion

## 2022-07-07 NOTE — Plan of Care (Signed)

## 2022-07-08 NOTE — Progress Notes (Signed)
PROGRESS NOTE  Natalie Brown [id number] DOB: 1966-02-26 DOA: 12/24/2021 PCP: Marliss Coots, NP   LOS: 194 days   Brief Narrative / Interim history: [name] is a 57 yo female with PMH bipolar disorder, HTN, recent hospitalization at [facility] who presents to the hospital with altered mentation and tachycardia.   Significant events: 09/07/2021 admitted to behavioral health.  Treated with Zyprexa Invega and Ativan challenge for catatonia. 12/24/2021 sent to ER from [facility] for evaluation for tachycardia 9/04-9/5, 9/5>9/9, LTM EEG  no seizure. TSH stable CTA chest no PE no pneumonia RPR nonreactive bilateral LE Doppler negative for DVT Echo with normal EF  9/23 febrile secondary to Covid 19 + no pneumonia. 9/25 repeat CT brain unremarkable. She has had prolonged hospitalization seen by psychiatry.Per psychiatry does not meet criteria to go back to behavioral health, awaiting for placement to memory care unit. 3/4 psychiatry consulted for increasing agitation BP has been fluctuating specially in the morning while on the bed hypotensive asymptomatic improves spontaneously  Subjective / 24h Interval events: Alert, does not interact with me  Assesement and Plan: Principal Problem:   Early onset Dementia with behavioral disturbance (Natalie Brown) Active Problems:   Delirium due to multiple etiologies, acute, hyperactive   Bipolar affective disorder, current episode manic with psychotic symptoms (Natalie Brown)   Sinus tachycardia   Tobacco abuse   Altered mental state   B12 deficiency   COVID-19 virus infection   Drooling   Essential hypertension   Principal problem Acute metabolic encephalopathy in the setting of chronic progressive dementia Intermittent agitation - initially admitted to behavioral health Hospital for aggressive behavior and transferred to [facility] for altered mental status with concern for catatonia>seen by psychiatry and neurology s/p MRI and EEG -unrevealing.  Labs ANA and  negative June 2023.Mental status remains at baseline,in which she does not participate much in conversation but ambulates freely during daytime-does not dyspneic.Intermittently agitated.3/5:Psychiatry reconsulted for agitation to adjust medication as needed-see below.   Active problems Bipolar disorder with intermittent agitation: Continue current Haldol scheduled along with PRNs  olanzapine/Ativan/Atarax.  Continue sitter at the bedside continue psychiatry plan- they are trying to reach family to consider switching psych meds-defer to psych-I asked psych to talk to [facility] re contact   Hypotension -resolved.Use Midodrine as needed.    Essential hypertension - see above  CKD stage IIIb - Stable  Vitamin B12 deficiency- Cont vitamin B12  Debility, deconditioning - Ambulating.    Scheduled Meds:  (feeding supplement) PROSource Plus  30 mL Oral BID BM   vitamin B-12  500 mcg Oral Daily   enoxaparin (LOVENOX) injection  40 mg Subcutaneous Q24H   feeding supplement (KATE FARMS STANDARD 1.4)  325 mL Oral BID BM   haloperidol  2 mg Oral BID   hydrocerin   Topical BID   senna-docusate  2 tablet Oral QHS   Continuous Infusions: PRN Meds:.acetaminophen, alum & mag hydroxide-simeth, fluticasone, hydrOXYzine, LORazepam, magnesium hydroxide, metoprolol tartrate, OLANZapine **OR** OLANZapine, ondansetron, mouth rinse  Current Outpatient Medications  Medication Instructions   Claritin 10 mg, Oral, Daily   cyanocobalamin (VITAMIN B12) 500 mcg, Oral, Daily   fluticasone (FLONASE) 50 MCG/ACT nasal spray 1 spray, Each Nare, Daily   haloperidol (HALDOL) 1 mg, Oral, 2 times daily   hydrOXYzine (ATARAX) 25 mg, Oral, 3 times daily PRN   Incontinence Supply Disposable (BRIEFS OVERNIGHT MEDIUM) MISC 1 each, Does not apply, Every 4 hours PRN   LORazepam (ATIVAN) 0.5 mg, Oral, Every 6 hours PRN  OLANZapine zydis (ZYPREXA) 5 mg, Oral, Every 8 hours PRN   pseudoephedrine-guaifenesin (MUCINEX D) 60-600 MG 12 hr  tablet 1 tablet, Oral, Every 12 hours    Diet Orders (From admission, onward)     Start     Ordered   05/23/22 1257  DIET DYS 3 Room service appropriate? Yes; Fluid consistency: Thin  Diet effective now       Comments: Patient is not on isolation precautions  Question Answer Comment  Room service appropriate? Yes   Fluid consistency: Thin      05/23/22 1257            DVT prophylaxis: enoxaparin (LOVENOX) injection 40 mg Start: 12/26/21 1200   Lab Results  Component Value Date   PLT 292 07/04/2022      Code Status: Full Code  Family Communication: no family at bedside   Status is: Inpatient  Remains inpatient appropriate because: severity of illness  Level of care: Med-Surg  Consultants:  Psychiatry   Objective: Vitals:   07/07/22 1930 07/07/22 2230 07/08/22 0400 07/08/22 0756  BP:  120/82 128/80 93/61  Pulse:  88 99 78  Resp: 16 16 15 16   Temp:  98.2 F (36.8 C) 98.2 F (36.8 C) 98.9 F (37.2 C)  TempSrc:  Oral Oral Oral  SpO2:   100% 100%  Weight:      Height:        Intake/Output Summary (Last 24 hours) at 07/08/2022 1025 Last data filed at 07/08/2022 0400 Gross per 24 hour  Intake 600 ml  Output --  Net 600 ml    Wt Readings from Last 3 Encounters:  07/04/22 60.5 kg  12/24/21 72 kg  09/05/21 72.6 kg    Examination:  Constitutional: NAD  Data Reviewed: I have independently reviewed following labs and imaging studies   CBC Recent Labs  Lab 07/04/22 0801  WBC 5.0  HGB 14.5  HCT 43.8  PLT 292  MCV 88.3  MCH 29.2  MCHC 33.1  RDW 13.8     Recent Labs  Lab 07/04/22 0801  NA 141  K 4.5  CL 108  CO2 23  GLUCOSE 101*  BUN 16  CREATININE 1.18*  CALCIUM 9.3  AST 21  ALT 27  ALKPHOS 52  BILITOT 0.8  ALBUMIN 3.7  MG 2.1     ------------------------------------------------------------------------------------------------------------------ No results for input(s): "CHOL", "HDL", "LDLCALC", "TRIG", "CHOLHDL", "LDLDIRECT"  in the last 72 hours.  Lab Results  Component Value Date   HGBA1C 5.3 09/08/2021   ------------------------------------------------------------------------------------------------------------------ No results for input(s): "TSH", "T4TOTAL", "T3FREE", "THYROIDAB" in the last 72 hours.  Invalid input(s): "FREET3"  Cardiac Enzymes No results for input(s): "CKMB", "TROPONINI", "MYOGLOBIN" in the last 168 hours.  Invalid input(s): "CK" ------------------------------------------------------------------------------------------------------------------ No results found for: "BNP"  CBG: No results for input(s): "GLUCAP" in the last 168 hours.  No results found for this or any previous visit (from the past 240 hour(s)).   Radiology Studies: No results found.   Marzetta Board, MD, PhD [facility]  Between 7 am - 7 pm I am available, please contact me via Amion (for emergencies) or Securechat (non urgent messages)  Between 7 pm - 7 am I am not available, please contact night coverage MD/APP via Amion

## 2022-07-08 NOTE — Progress Notes (Signed)
Informed by charge RN that sitter coverage for patient will not be covered from 2300-0700. Falls precautions maintained. Bed Alarm ON. Frequent checks by this RN to maintain patient safety.

## 2022-07-08 NOTE — Progress Notes (Signed)
This RN at bedside from [phone] for safety. Patient climbing out of bed/unsteady on feet/not re-directable/unable to follow commands.  Ativan 0.5mg  PO given as ordered. No sitter available from 2300-0700 as informed by Agricultural consultant. Patient remains HIGH FALLS RISK.

## 2022-07-09 NOTE — Progress Notes (Signed)
PROGRESS NOTE  Natalie Brown [id number] DOB: May 27, 1965 DOA: 12/24/2021 PCP: Natalie Coots, NP   LOS: 195 days   Brief Narrative / Interim history: [name] is a 57 yo female with PMH bipolar disorder, HTN, recent hospitalization at [facility] who presents to the hospital with altered mentation and tachycardia.   Significant events: 09/07/2021 admitted to behavioral health.  Treated with Zyprexa Invega and Ativan challenge for catatonia. 12/24/2021 sent to ER from [facility] for evaluation for tachycardia 9/04-9/5, 9/5>9/9, LTM EEG  no seizure. TSH stable CTA chest no PE no pneumonia RPR nonreactive bilateral LE Doppler negative for DVT Echo with normal EF  9/23 febrile secondary to Covid 19 + no pneumonia. 9/25 repeat CT brain unremarkable. She has had prolonged hospitalization seen by psychiatry.Per psychiatry does not meet criteria to go back to behavioral health, awaiting for placement to memory care unit. 3/4 psychiatry consulted for increasing agitation BP has been fluctuating specially in the morning while on the bed hypotensive asymptomatic improves spontaneously  Subjective / 24h Interval events: Alert, does not interact with me  Assesement and Plan: Principal Problem:   Early onset Dementia with behavioral disturbance ([place]) Active Problems:   Delirium due to multiple etiologies, acute, hyperactive   Bipolar affective disorder, current episode manic with psychotic symptoms ([place])   Sinus tachycardia   Tobacco abuse   Altered mental state   B12 deficiency   COVID-19 virus infection   Drooling   Essential hypertension   Principal problem Acute metabolic encephalopathy in the setting of chronic progressive dementia Intermittent agitation - initially admitted to behavioral health Hospital for aggressive behavior and transferred to [facility] for altered mental status with concern for catatonia>seen by psychiatry and neurology s/p MRI and EEG -unrevealing.  Labs ANA and  negative June 2023.Mental status remains at baseline,in which she does not participate much in conversation but ambulates freely during daytime. Intermittently agitated. 3/5 - Psychiatry reconsulted for agitation to adjust medication as needed-see below.   Active problems Bipolar disorder with intermittent agitation: Continue current Haldol scheduled along with PRNs  olanzapine/Ativan/Atarax.  Continue sitter at the bedside continue psychiatry plan- they are trying to reach family to consider switching psych meds-defer to psych-I asked psych to talk to [facility] re contact   Hypotension -resolved. Use Midodrine as needed.    Essential hypertension - see above  CKD stage IIIb - Stable  Vitamin B12 deficiency- Cont vitamin B12  Debility, deconditioning - Ambulating.    Scheduled Meds:  (feeding supplement) PROSource Plus  30 mL Oral BID BM   vitamin B-12  500 mcg Oral Daily   enoxaparin (LOVENOX) injection  40 mg Subcutaneous Q24H   feeding supplement (KATE FARMS STANDARD 1.4)  325 mL Oral BID BM   haloperidol  2 mg Oral BID   hydrocerin   Topical BID   senna-docusate  2 tablet Oral QHS   Continuous Infusions: PRN Meds:.acetaminophen, alum & mag hydroxide-simeth, fluticasone, hydrOXYzine, LORazepam, magnesium hydroxide, metoprolol tartrate, OLANZapine **OR** OLANZapine, ondansetron, mouth rinse  Current Outpatient Medications  Medication Instructions   Claritin 10 mg, Oral, Daily   cyanocobalamin (VITAMIN B12) 500 mcg, Oral, Daily   fluticasone (FLONASE) 50 MCG/ACT nasal spray 1 spray, Each Nare, Daily   haloperidol (HALDOL) 1 mg, Oral, 2 times daily   hydrOXYzine (ATARAX) 25 mg, Oral, 3 times daily PRN   Incontinence Supply Disposable (BRIEFS OVERNIGHT MEDIUM) MISC 1 each, Does not apply, Every 4 hours PRN   LORazepam (ATIVAN) 0.5 mg, Oral, Every  6 hours PRN   OLANZapine zydis (ZYPREXA) 5 mg, Oral, Every 8 hours PRN   pseudoephedrine-guaifenesin (MUCINEX D) 60-600 MG 12 hr tablet 1  tablet, Oral, Every 12 hours    Diet Orders (From admission, onward)     Start     Ordered   05/23/22 1257  DIET DYS 3 Room service appropriate? Yes; Fluid consistency: Thin  Diet effective now       Comments: Patient is not on isolation precautions  Question Answer Comment  Room service appropriate? Yes   Fluid consistency: Thin      05/23/22 1257            DVT prophylaxis: enoxaparin (LOVENOX) injection 40 mg Start: 12/26/21 1200   Lab Results  Component Value Date   PLT 292 07/04/2022      Code Status: Full Code  Family Communication: no family at bedside   Status is: Inpatient  Remains inpatient appropriate because: severity of illness  Level of care: Med-Surg  Consultants:  Psychiatry   Objective: Vitals:   07/07/22 2230 07/08/22 0400 07/08/22 0756 07/08/22 1808  BP: 120/82 128/80 93/61 100/65  Pulse: 88 99 78 80  Resp: 16 15 16 16   Temp: 98.2 F (36.8 C) 98.2 F (36.8 C) 98.9 F (37.2 C) 98 F (36.7 C)  TempSrc: Oral Oral Oral Oral  SpO2:  100% 100% 100%  Weight:      Height:       No intake or output data in the 24 hours ending 07/09/22 1111  Wt Readings from Last 3 Encounters:  07/04/22 60.5 kg  12/24/21 72 kg  09/05/21 72.6 kg    Examination:  Constitutional: NAD  Data Reviewed: I have independently reviewed following labs and imaging studies   CBC Recent Labs  Lab 07/04/22 0801  WBC 5.0  HGB 14.5  HCT 43.8  PLT 292  MCV 88.3  MCH 29.2  MCHC 33.1  RDW 13.8     Recent Labs  Lab 07/04/22 0801  NA 141  K 4.5  CL 108  CO2 23  GLUCOSE 101*  BUN 16  CREATININE 1.18*  CALCIUM 9.3  AST 21  ALT 27  ALKPHOS 52  BILITOT 0.8  ALBUMIN 3.7  MG 2.1     ------------------------------------------------------------------------------------------------------------------ No results for input(s): "CHOL", "HDL", "LDLCALC", "TRIG", "CHOLHDL", "LDLDIRECT" in the last 72 hours.  Lab Results  Component Value Date   HGBA1C  5.3 09/08/2021   ------------------------------------------------------------------------------------------------------------------ No results for input(s): "TSH", "T4TOTAL", "T3FREE", "THYROIDAB" in the last 72 hours.  Invalid input(s): "FREET3"  Cardiac Enzymes No results for input(s): "CKMB", "TROPONINI", "MYOGLOBIN" in the last 168 hours.  Invalid input(s): "CK" ------------------------------------------------------------------------------------------------------------------ No results found for: "BNP"  CBG: No results for input(s): "GLUCAP" in the last 168 hours.  No results found for this or any previous visit (from the past 240 hour(s)).   Radiology Studies: No results found.   Marzetta Board, MD, PhD [facility]  Between 7 am - 7 pm I am available, please contact me via Amion (for emergencies) or Securechat (non urgent messages)  Between 7 pm - 7 am I am not available, please contact night coverage MD/APP via Amion

## 2022-07-09 NOTE — Progress Notes (Signed)
CSW spoke with Natalie Brown of [place] to discuss referral - Natalie Brown states that patient is not appropriate for her [facility] as it is not locked facility.  Madilyn Fireman, MSW, LCSW Transitions of Care  Clinical Social Worker II [phone]

## 2022-07-10 DIAGNOSIS — I10 Essential (primary) hypertension: Secondary | ICD-10-CM

## 2022-07-10 NOTE — Plan of Care (Signed)

## 2022-07-10 NOTE — Progress Notes (Signed)
[facility]                                                                               Natalie Brown, is a 57 y.o. female, DOB - 1966/01/24, [id number] Admit date - 12/24/2021    Outpatient Primary MD for the patient is [name], [name], NP  LOS - 196  days    Brief summary   Natalie Brown is a 57 yo female with PMH bipolar disorder, HTN, recent hospitalization at [facility] who presents to the hospital with altered mentation and tachycardia.     Assessment & Plan    Assessment and Plan:   Acute metabolic encephalopathy in the setting of chronic progressive dementia;  Catatonia.  Neurology and psychiatry consulted.  S/p MRI brain and EEG unrevealing.  ANA negative.  Mental status remains at baseline.  Psychiatry re consulted for agitation.    Bipolar disorder with intermittent agitation:  - Haldol with prns.  - psychiatry re consulted.     Hypertension:  Well controlled.    Stage 3 b CKD:  Creatinine at baseline.     Debility and deconditioning  - SNF placement.   RN Pressure Injury Documentation:    Malnutrition Type:  Nutrition Problem: Altered GI function Etiology: constipation   Malnutrition Characteristics:  Signs/Symptoms: other (comment) (last BM 10 days ago)   Nutrition Interventions:  Interventions: Other (Comment) (scheduled bowel regimen per MD)  Estimated body mass index is 21.53 kg/m as calculated from the following:   Height as of this encounter: 5\' 6"  (1.676 m).   Weight as of this encounter: 60.5 kg.  Code Status: full code.  DVT Prophylaxis:  enoxaparin (LOVENOX) injection 40 mg Start: 12/26/21 1200   Level of Care: Level of care: Med-Surg Family Communication: none at bedside.   Disposition Plan:     Remains inpatient appropriate:    Procedures:  None.   Consultants:   Psychiatry.   Antimicrobials:   Anti-infectives (From admission, onward)    None        Medications  Scheduled Meds:   (feeding supplement) PROSource Plus  30 mL Oral BID BM   vitamin B-12  500 mcg Oral Daily   enoxaparin (LOVENOX) injection  40 mg Subcutaneous Q24H   feeding supplement (KATE FARMS STANDARD 1.4)  325 mL Oral BID BM   haloperidol  2 mg Oral BID   hydrocerin   Topical BID   senna-docusate  2 tablet Oral QHS   Continuous Infusions: PRN Meds:.acetaminophen, alum & mag hydroxide-simeth, fluticasone, hydrOXYzine, LORazepam, magnesium hydroxide, metoprolol tartrate, OLANZapine **OR** OLANZapine, ondansetron, mouth rinse    Subjective:   Natalie Brown was seen and examined today.  She appears to be comfortable and appears to be at baseline.    Objective:   Vitals:   07/08/22 1808 07/09/22 1612 07/09/22 1933 07/10/22 0502  BP: 100/65 119/81 (!) 129/90 131/70  Pulse: 80 96 91 (!) 108  Resp: 16 17    Temp: 98 F (36.7 C) 98.4 F (36.9 C) 98.5 F (36.9 C) 98.6 F (37 C)  TempSrc: Oral Oral Oral Oral  SpO2: 100% 99% 100% 100%  Weight:      Height:  Intake/Output Summary (Last 24 hours) at 07/10/2022 1426 Last data filed at 07/10/2022 0916 Gross per 24 hour  Intake 474 ml  Output --  Net 474 ml   Filed Weights   02/03/22 2025 07/04/22 1100  Weight: 72 kg 60.5 kg     Exam General exam: Appears calm and comfortable  Respiratory system: Clear to auscultation. Respiratory effort normal. Cardiovascular system: S1 & S2 heard, RRR. No JVD, Gastrointestinal system: Abdomen is nondistended, soft and nontender.  Central nervous system: Alert and eating lunch, but not answering any questions.      Data Reviewed:  I have personally reviewed following labs and imaging studies   CBC Lab Results  Component Value Date   WBC 5.0 07/04/2022   RBC 4.96 07/04/2022   HGB 14.5 07/04/2022   HCT 43.8 07/04/2022   MCV 88.3 07/04/2022   MCH 29.2 07/04/2022   PLT 292 07/04/2022   MCHC 33.1 07/04/2022   RDW 13.8 07/04/2022   LYMPHSABS 1.8 06/03/2022   MONOABS 0.3 06/03/2022    EOSABS 0.2 06/03/2022   BASOSABS 0.0 [id number]     Last metabolic panel Lab Results  Component Value Date   NA 141 07/04/2022   K 4.5 07/04/2022   CL 108 07/04/2022   CO2 23 07/04/2022   BUN 16 07/04/2022   CREATININE 1.18 (H) 07/04/2022   GLUCOSE 101 (H) 07/04/2022   GFRNONAA 55 (L) 07/04/2022   GFRAA 65 09/19/2017   CALCIUM 9.3 07/04/2022   PROT 7.0 07/04/2022   ALBUMIN 3.7 07/04/2022   LABGLOB 2.8 06/21/2020   AGRATIO 1.5 06/21/2020   BILITOT 0.8 07/04/2022   ALKPHOS 52 07/04/2022   AST 21 07/04/2022   ALT 27 07/04/2022   ANIONGAP 10 07/04/2022    CBG (last 3)  No results for input(s): "GLUCAP" in the last 72 hours.    Coagulation Profile: No results for input(s): "INR", "PROTIME" in the last 168 hours.   Radiology Studies: No results found.     Hosie Poisson M.D. [facility] 07/10/2022, 2:26 PM  Available via Epic secure chat 7am-7pm After 7 pm, please refer to night coverage provider listed on amion.

## 2022-07-11 NOTE — Progress Notes (Signed)
[facility]                                                                               Natalie Brown, is a 57 y.o. female, DOB - [birth year], [id number] Admit date - 12/24/2021    Outpatient Primary MD for the patient is [name], Natalie Muscat, NP  LOS - 197  days    Brief summary   [name] is a 58 yo female with PMH bipolar disorder, HTN, recent hospitalization at [facility] who presents to the hospital with altered mentation and tachycardia.     Assessment & Plan    Assessment and Plan:   Acute metabolic encephalopathy in the setting of chronic progressive dementia;  Catatonia.  Neurology and psychiatry consulted and recommendations given.  S/p MRI brain and EEG unrevealing.  ANA negative.  Mental status remains at baseline.  [name] currently on haldol 2 mg BID, hydroxyzine 25 mg TID prn, AND ATIVAN 0.5 MG EVERY 6 HOURS PRN AND ZYPREXA 5 MG bid PRN.  Psychiatry re consulted for agitation.    Bipolar disorder with intermittent agitation:  [name] currently on haldol 2 mg BID, hydroxyzine 25 mg TID prn, AND ATIVAN 0.5 MG EVERY 6 HOURS PRN AND ZYPREXA 5 MG bid PRN    Hypertension:  Optimal BP parameters. Not on any meds.     Stage 3 b CKD:  Creatinine at baseline.  No new labs .    Debility and deconditioning  - SNF placement.   RN Pressure Injury Documentation:    Malnutrition Type:  Nutrition Problem: Altered GI function Etiology: constipation   Malnutrition Characteristics:  Signs/Symptoms: other (comment) (last BM 10 days ago)   Nutrition Interventions:  Interventions: Other (Comment) (scheduled bowel regimen per MD)  Estimated body mass index is 21.53 kg/m as calculated from the following:   Height as of this encounter: 5\' 6"  (1.676 m).   Weight as of this encounter: 60.5 kg.  Code Status: full code.  DVT Prophylaxis:  enoxaparin (LOVENOX) injection 40 mg Start: 12/26/21 1200   Level of Care: Level of care: Med-Surg Family  Communication: none at bedside.   Disposition Plan:     Remains inpatient appropriate:  awaiting placement.   Procedures:  None.   Consultants:   Psychiatry.   Antimicrobials:   Anti-infectives (From admission, onward)    None        Medications  Scheduled Meds:  (feeding supplement) PROSource Plus  30 mL Oral BID BM   vitamin B-12  500 mcg Oral Daily   enoxaparin (LOVENOX) injection  40 mg Subcutaneous Q24H   feeding supplement (KATE FARMS STANDARD 1.4)  325 mL Oral BID BM   haloperidol  2 mg Oral BID   hydrocerin   Topical BID   senna-docusate  2 tablet Oral QHS   Continuous Infusions: PRN Meds:.acetaminophen, alum & mag hydroxide-simeth, fluticasone, hydrOXYzine, LORazepam, magnesium hydroxide, metoprolol tartrate, OLANZapine **OR** OLANZapine, ondansetron, mouth rinse    Subjective:   Natalie Brown was seen and examined today.  No new complaints.    Objective:   Vitals:   07/10/22 0502 07/10/22 2119 07/11/22 0601 07/11/22 0913  BP: 131/70 116/78 125/67 [name])  135/103  Pulse: (!) 108 63 (!) 110 85  Resp:  15 17 18   Temp: 98.6 F (37 C) 98.9 F (37.2 C) 98.5 F (36.9 C) 98.2 F (36.8 C)  TempSrc: Oral Oral Oral Oral  SpO2: 100% 100% 100% 100%  Weight:      Height:       No intake or output data in the 24 hours ending 07/11/22 1423  Filed Weights   02/03/22 2025 07/04/22 1100  Weight: 72 kg 60.5 kg     Exam Alert and comfortable, not answering questions, but eating her breakfast quietly.  No agitation today.       Data Reviewed:  I have personally reviewed following labs and imaging studies   CBC Lab Results  Component Value Date   WBC 5.0 07/04/2022   RBC 4.96 07/04/2022   HGB 14.5 07/04/2022   HCT 43.8 07/04/2022   MCV 88.3 07/04/2022   MCH 29.2 07/04/2022   PLT 292 07/04/2022   MCHC 33.1 07/04/2022   RDW 13.8 07/04/2022   LYMPHSABS 1.8 06/03/2022   MONOABS 0.3 06/03/2022   EOSABS 0.2 06/03/2022   BASOSABS 0.0 06/03/2022      Last metabolic panel Lab Results  Component Value Date   NA 141 07/04/2022   K 4.5 07/04/2022   CL 108 07/04/2022   CO2 23 07/04/2022   BUN 16 07/04/2022   CREATININE 1.18 (H) 07/04/2022   GLUCOSE 101 (H) 07/04/2022   GFRNONAA 55 (L) 07/04/2022   GFRAA 65 09/19/2017   CALCIUM 9.3 07/04/2022   PROT 7.0 07/04/2022   ALBUMIN 3.7 07/04/2022   LABGLOB 2.8 06/21/2020   AGRATIO 1.5 06/21/2020   BILITOT 0.8 07/04/2022   ALKPHOS 52 07/04/2022   AST 21 07/04/2022   ALT 27 07/04/2022   ANIONGAP 10 07/04/2022    CBG (last 3)  No results for input(s): "GLUCAP" in the last 72 hours.    Coagulation Profile: No results for input(s): "INR", "PROTIME" in the last 168 hours.   Radiology Studies: No results found.     Hosie Poisson M.D. [facility] 07/11/2022, 2:23 PM  Available via Epic secure chat 7am-7pm After 7 pm, please refer to night coverage provider listed on amion.

## 2022-07-11 NOTE — Plan of Care (Signed)

## 2022-07-12 NOTE — Progress Notes (Signed)
[facility]                                                                               Sharnae Friedrich, is a 57 y.o. female, DOB - [birth year], [id number] Admit date - 12/24/2021    Outpatient Primary MD for the patient is [name], [name], NP  LOS - 198  days    Brief summary   [name] is a 57 yo female with PMH bipolar disorder, HTN, recent hospitalization at [facility] who presents to the hospital with altered mentation and tachycardia.     Assessment & Plan    Assessment and Plan:   Acute metabolic encephalopathy in the setting of chronic progressive dementia;  Catatonia.  Neurology and psychiatry consulted and recommendations given.  S/p MRI brain and EEG unrevealing.  ANA negative.  Mental status remains at baseline.  [name] currently on haldol 2 mg BID, hydroxyzine 25 mg TID prn, AND ATIVAN 0.5 MG EVERY 6 HOURS PRN AND ZYPREXA 5 MG bid PRN.  Psychiatry re consulted for agitation.    Bipolar disorder with intermittent agitation:  [name] currently on haldol 2 mg BID, hydroxyzine 25 mg TID prn, AND ATIVAN 0.5 MG EVERY 6 HOURS PRN AND ZYPREXA 5 MG bid PRN    Hypertension:  Optimal BP parameters. Not on any meds.     Stage 3 b CKD:  Creatinine at baseline.  No new labs .    Debility and deconditioning  - SNF placement.   RN Pressure Injury Documentation:    Malnutrition Type:  Nutrition Problem: Altered GI function Etiology: constipation   Malnutrition Characteristics:  Signs/Symptoms: other (comment) (last BM 10 days ago)   Nutrition Interventions:  Interventions: Other (Comment) (scheduled bowel regimen per MD)  Estimated body mass index is 21.53 kg/m as calculated from the following:   Height as of this encounter: 5\' 6"  (1.676 m).   Weight as of this encounter: 60.5 kg.  Code Status: full code.  DVT Prophylaxis:  enoxaparin (LOVENOX) injection 40 mg Start: 12/26/21 1200   Level of Care: Level of care: Med-Surg Family  Communication: none at bedside.   Disposition Plan:     Remains inpatient appropriate:  awaiting placement.   Procedures:  None.   Consultants:   Psychiatry.   Antimicrobials:   Anti-infectives (From admission, onward)    None        Medications  Scheduled Meds:  (feeding supplement) PROSource Plus  30 mL Oral BID BM   vitamin B-12  500 mcg Oral Daily   enoxaparin (LOVENOX) injection  40 mg Subcutaneous Q24H   feeding supplement (KATE FARMS STANDARD 1.4)  325 mL Oral BID BM   haloperidol  2 mg Oral BID   hydrocerin   Topical BID   senna-docusate  2 tablet Oral QHS   Continuous Infusions: PRN Meds:.acetaminophen, alum & mag hydroxide-simeth, fluticasone, hydrOXYzine, LORazepam, magnesium hydroxide, metoprolol tartrate, OLANZapine **OR** OLANZapine, ondansetron, mouth rinse    Subjective:   Morningstar Borns was seen and examined today.  No new complaints.    Objective:   Vitals:   07/11/22 0601 07/11/22 0913 07/12/22 0529 07/12/22 0939  BP: 125/67 (!) 135/103 [name])  162/74 122/82  Pulse: (!) 110 85 (!) 110 (!) 110  Resp: 17 18 17 18   Temp: 98.5 F (36.9 C) 98.2 F (36.8 C) 98 F (36.7 C) 98.1 F (36.7 C)  TempSrc: Oral Oral  Oral  SpO2: 100% 100% 100% 100%  Weight:      Height:        Intake/Output Summary (Last 24 hours) at 07/12/2022 1402 Last data filed at 07/12/2022 1042 Gross per 24 hour  Intake 386 ml  Output --  Net 386 ml    Filed Weights   02/03/22 2025 07/04/22 1100  Weight: 72 kg 60.5 kg     Exam Alert and comfortable, not answering questions, but eating her breakfast quietly.  No agitation today.       Data Reviewed:  I have personally reviewed following labs and imaging studies   CBC Lab Results  Component Value Date   WBC 5.0 07/04/2022   RBC 4.96 07/04/2022   HGB 14.5 07/04/2022   HCT 43.8 07/04/2022   MCV 88.3 07/04/2022   MCH 29.2 07/04/2022   PLT 292 07/04/2022   MCHC 33.1 07/04/2022   RDW 13.8 07/04/2022    LYMPHSABS 1.8 06/03/2022   MONOABS 0.3 06/03/2022   EOSABS 0.2 06/03/2022   BASOSABS 0.0 [id number]     Last metabolic panel Lab Results  Component Value Date   NA 141 07/04/2022   K 4.5 07/04/2022   CL 108 07/04/2022   CO2 23 07/04/2022   BUN 16 07/04/2022   CREATININE 1.18 (H) 07/04/2022   GLUCOSE 101 (H) 07/04/2022   GFRNONAA 55 (L) 07/04/2022   GFRAA 65 09/19/2017   CALCIUM 9.3 07/04/2022   PROT 7.0 07/04/2022   ALBUMIN 3.7 07/04/2022   LABGLOB 2.8 06/21/2020   AGRATIO 1.5 06/21/2020   BILITOT 0.8 07/04/2022   ALKPHOS 52 07/04/2022   AST 21 07/04/2022   ALT 27 07/04/2022   ANIONGAP 10 07/04/2022    CBG (last 3)  No results for input(s): "GLUCAP" in the last 72 hours.    Coagulation Profile: No results for input(s): "INR", "PROTIME" in the last 168 hours.   Radiology Studies: No results found.     Hosie Poisson M.D. [facility] 07/12/2022, 2:02 PM  Available via Epic secure chat 7am-7pm After 7 pm, please refer to night coverage provider listed on amion.

## 2022-07-12 NOTE — Plan of Care (Signed)

## 2022-07-13 NOTE — Progress Notes (Signed)
[facility]                                                                               Joretha Wilby, is a 57 y.o. female, DOB - 1965-06-22, [id number] Admit date - 12/24/2021    Outpatient Primary MD for the patient is [name], [name], NP  LOS - 199  days    Brief summary   Natalie Brown is a 57 yo female with PMH bipolar disorder, HTN, recent hospitalization at [facility] who presents to the hospital with altered mentation and tachycardia.   she was admitted for acute metabolic encephalopathy.   Assessment & Plan    Assessment and Plan:   Acute metabolic encephalopathy in the setting of chronic progressive dementia;   Neurology and psychiatry consulted and recommendations given.  S/p MRI brain and EEG unrevealing.  ANA negative.  Mental status remains at baseline. [name] mostly non verbal. But walks in the hallway.  [name] currently on haldol 2 mg BID, hydroxyzine 25 mg TID prn, ativan 0.5 mg every hours prn and zyprexa 5 mg BID prn.     Bipolar disorder with intermittent agitation:  [name] currently on haldol 2 mg BID, hydroxyzine 25 mg TID prn, AND ATIVAN 0.5 MG EVERY 6 HOURS PRN AND ZYPREXA 5 MG bid PRN No agitation overnight.     Hypertension:  Not on any meds.  BP parameters.     Stage 3 b CKD:  Creatinine at baseline.  No new labs .    Debility and deconditioning  - SNF placement.   RN Pressure Injury Documentation:    Malnutrition Type:  Nutrition Problem: Altered GI function Etiology: constipation   Malnutrition Characteristics:  Signs/Symptoms: other (comment) (last BM 10 days ago)   Nutrition Interventions:  Interventions: Other (Comment) (scheduled bowel regimen per MD)  Estimated body mass index is 21.53 kg/m as calculated from the following:   Height as of this encounter: 5\' 6"  (1.676 m).   Weight as of this encounter: 60.5 kg.  Code Status: full code.  DVT Prophylaxis:  enoxaparin (LOVENOX) injection 40 mg  Start: 12/26/21 1200   Level of Care: Level of care: Med-Surg Family Communication: none at bedside.   Disposition Plan:     Remains inpatient appropriate:  awaiting placement.   Procedures:  None.   Consultants:   Psychiatry.   Antimicrobials:   Anti-infectives (From admission, onward)    None        Medications  Scheduled Meds:  (feeding supplement) PROSource Plus  30 mL Oral BID BM   vitamin B-12  500 mcg Oral Daily   enoxaparin (LOVENOX) injection  40 mg Subcutaneous Q24H   feeding supplement (KATE FARMS STANDARD 1.4)  325 mL Oral BID BM   haloperidol  2 mg Oral BID   hydrocerin   Topical BID   senna-docusate  2 tablet Oral QHS   Continuous Infusions: PRN Meds:.acetaminophen, alum & mag hydroxide-simeth, fluticasone, hydrOXYzine, LORazepam, magnesium hydroxide, metoprolol tartrate, OLANZapine **OR** OLANZapine, ondansetron, mouth rinse    Subjective:   Natalie Brown was seen and examined today.  Overnight no agitation.    Objective:   Vitals:   07/11/22  0913 07/12/22 0529 07/12/22 0939 07/13/22 1043  BP: (!) 135/103 (!) 162/74 122/82 113/84  Pulse: 85 (!) 110 (!) 110 100  Resp: 18 17 18 18   Temp: 98.2 F (36.8 C) 98 F (36.7 C) 98.1 F (36.7 C) 98.4 F (36.9 C)  TempSrc: Oral  Oral Oral  SpO2: 100% 100% 100% 100%  Weight:      Height:        Intake/Output Summary (Last 24 hours) at 07/13/2022 1811 Last data filed at 07/13/2022 0900 Gross per 24 hour  Intake 220 ml  Output --  Net 220 ml    Filed Weights   02/03/22 2025 07/04/22 1100  Weight: 72 kg 60.5 kg     Exam Alert and comfortable, eating lunch in bed.  CVS s1s2 heard.  Lungs clear to auscultation.  Abd soft , non tender bs+      Data Reviewed:  I have personally reviewed following labs and imaging studies   CBC Lab Results  Component Value Date   WBC 5.0 07/04/2022   RBC 4.96 07/04/2022   HGB 14.5 07/04/2022   HCT 43.8 07/04/2022   MCV 88.3 07/04/2022   MCH  29.2 07/04/2022   PLT 292 07/04/2022   MCHC 33.1 07/04/2022   RDW 13.8 07/04/2022   LYMPHSABS 1.8 06/03/2022   MONOABS 0.3 06/03/2022   EOSABS 0.2 06/03/2022   BASOSABS 0.0 [id number]     Last metabolic panel Lab Results  Component Value Date   NA 141 07/04/2022   K 4.5 07/04/2022   CL 108 07/04/2022   CO2 23 07/04/2022   BUN 16 07/04/2022   CREATININE 1.18 (H) 07/04/2022   GLUCOSE 101 (H) 07/04/2022   GFRNONAA 55 (L) 07/04/2022   GFRAA 65 09/19/2017   CALCIUM 9.3 07/04/2022   PROT 7.0 07/04/2022   ALBUMIN 3.7 07/04/2022   LABGLOB 2.8 06/21/2020   AGRATIO 1.5 06/21/2020   BILITOT 0.8 07/04/2022   ALKPHOS 52 07/04/2022   AST 21 07/04/2022   ALT 27 07/04/2022   ANIONGAP 10 07/04/2022    CBG (last 3)  No results for input(s): "GLUCAP" in the last 72 hours.    Coagulation Profile: No results for input(s): "INR", "PROTIME" in the last 168 hours.   Radiology Studies: No results found.     Hosie Poisson M.D. [facility] 07/13/2022, 6:11 PM  Available via Epic secure chat 7am-7pm After 7 pm, please refer to night coverage provider listed on amion.

## 2022-07-13 NOTE — Plan of Care (Signed)

## 2022-07-14 NOTE — Progress Notes (Signed)
[facility]                                                                               Natalie Brown, is a 57 y.o. female, DOB - 06-Jan-1966, PO:[id number] Admit date - 12/24/2021    Outpatient Primary MD for the patient is [name], [name], NP  LOS - 200  days    Brief summary   Natalie Brown is a 57 yo female with PMH bipolar disorder, HTN, recent hospitalization at [facility] who presents to the hospital with altered mentation and tachycardia.   she was admitted for acute metabolic encephalopathy. [name] currently appears to be at baseline.   Assessment & Plan    Assessment and Plan:   Acute metabolic encephalopathy in the setting of chronic progressive dementia;   Neurology and psychiatry consulted and recommendations given.  S/p MRI brain and EEG unrevealing.  ANA negative.  Mental status remains at baseline. [name] mostly non verbal. But walks in the hallway.  [name] currently on haldol 2 mg BID, hydroxyzine 25 mg TID prn, ativan 0.5 mg every hours prn and zyprexa 5 mg BID prn.  No changes in meds.     Bipolar disorder with intermittent agitation:  [name] currently on haldol 2 mg BID, hydroxyzine 25 mg TID prn, AND ATIVAN 0.5 MG EVERY 6 HOURS PRN AND ZYPREXA 5 MG bid PRN No agitation overnight.     Hypertension:  BP parameters optimal.     Stage 3 b CKD:  Creatinine at baseline.  No new labs .    Debility and deconditioning  - SNF placement.   RN Pressure Injury Documentation:    Malnutrition Type:  Nutrition Problem: Altered GI function Etiology: constipation   Malnutrition Characteristics:  Signs/Symptoms: other (comment) (last BM 10 days ago)   Nutrition Interventions:  Interventions: Other (Comment) (scheduled bowel regimen per MD)  Estimated body mass index is 21.53 kg/m as calculated from the following:   Height as of this encounter: 5\' 6"  (1.676 m).   Weight as of this encounter: 60.5 kg.  Code Status: full code.  DVT  Prophylaxis:  enoxaparin (LOVENOX) injection 40 mg Start: 12/26/21 1200   Level of Care: Level of care: Med-Surg Family Communication: none at bedside.   Disposition Plan:     Remains inpatient appropriate:  awaiting placement.   Procedures:  None.   Consultants:   Psychiatry.   Antimicrobials:   Anti-infectives (From admission, onward)    None        Medications  Scheduled Meds:  (feeding supplement) PROSource Plus  30 mL Oral BID BM   vitamin B-12  500 mcg Oral Daily   enoxaparin (LOVENOX) injection  40 mg Subcutaneous Q24H   feeding supplement (KATE FARMS STANDARD 1.4)  325 mL Oral BID BM   haloperidol  2 mg Oral BID   hydrocerin   Topical BID   senna-docusate  2 tablet Oral QHS   Continuous Infusions: PRN Meds:.acetaminophen, alum & mag hydroxide-simeth, fluticasone, hydrOXYzine, LORazepam, magnesium hydroxide, metoprolol tartrate, OLANZapine **OR** OLANZapine, ondansetron, mouth rinse    Subjective:   Natalie Brown was seen and examined today.   Appears to be  at baseline.   Objective:   Vitals:   07/12/22 0529 07/12/22 0939 07/13/22 1043 07/14/22 0337  BP: (!) 162/74 122/82 113/84 (!) 135/93  Pulse: (!) 110 (!) 110 100 94  Resp: 17 18 18 19   Temp: 98 F (36.7 C) 98.1 F (36.7 C) 98.4 F (36.9 C) 97.9 F (36.6 C)  TempSrc:  Oral Oral Oral  SpO2: 100% 100% 100% 100%  Weight:      Height:       No intake or output data in the 24 hours ending 07/14/22 1612  Filed Weights   02/03/22 2025 07/04/22 1100  Weight: 72 kg 60.5 kg     Exam Alert and comfortable.  CVS s1s2,  Lungs clear.       Data Reviewed:  I have personally reviewed following labs and imaging studies   CBC Lab Results  Component Value Date   WBC 5.0 07/04/2022   RBC 4.96 07/04/2022   HGB 14.5 07/04/2022   HCT 43.8 07/04/2022   MCV 88.3 07/04/2022   MCH 29.2 07/04/2022   PLT 292 07/04/2022   MCHC 33.1 07/04/2022   RDW 13.8 07/04/2022   LYMPHSABS 1.8 06/03/2022    MONOABS 0.3 06/03/2022   EOSABS 0.2 06/03/2022   BASOSABS 0.0 [id number]     Last metabolic panel Lab Results  Component Value Date   NA 141 07/04/2022   K 4.5 07/04/2022   CL 108 07/04/2022   CO2 23 07/04/2022   BUN 16 07/04/2022   CREATININE 1.18 (H) 07/04/2022   GLUCOSE 101 (H) 07/04/2022   GFRNONAA 55 (L) 07/04/2022   GFRAA 65 09/19/2017   CALCIUM 9.3 07/04/2022   PROT 7.0 07/04/2022   ALBUMIN 3.7 07/04/2022   LABGLOB 2.8 06/21/2020   AGRATIO 1.5 06/21/2020   BILITOT 0.8 07/04/2022   ALKPHOS 52 07/04/2022   AST 21 07/04/2022   ALT 27 07/04/2022   ANIONGAP 10 07/04/2022    CBG (last 3)  No results for input(s): "GLUCAP" in the last 72 hours.    Coagulation Profile: No results for input(s): "INR", "PROTIME" in the last 168 hours.   Radiology Studies: No results found.     Hosie Poisson M.D. [facility] 07/14/2022, 4:12 PM  Available via Epic secure chat 7am-7pm After 7 pm, please refer to night coverage provider listed on amion.

## 2022-07-15 NOTE — Progress Notes (Signed)
[facility]                                                                               Natalie Brown, is a 57 y.o. female, DOB - [birth year], BV:[id number] Admit date - 12/24/2021    Outpatient Primary MD for the patient is [name], [name], NP  LOS - 201  days    Brief summary   Natalie Brown is a 57 yo female with PMH bipolar disorder, HTN, recent hospitalization at [facility] who presents to the hospital with altered mentation and tachycardia.   she was admitted for acute metabolic encephalopathy. [name] currently appears to be at baseline.   Assessment & Plan    Assessment and Plan:   Acute metabolic encephalopathy in the setting of chronic progressive dementia;   Neurology and psychiatry consulted and recommendations given.  S/p MRI brain and EEG unrevealing.  ANA negative.  Mental status remains at baseline. [name] mostly non verbal. But walks in the hallway.  [name] currently on haldol 2 mg BID, hydroxyzine 25 mg TID prn, ativan 0.5 mg every hours prn and zyprexa 5 mg BID prn.  No changes in meds.     Bipolar disorder with intermittent agitation:  [name] currently on haldol 2 mg BID, hydroxyzine 25 mg TID prn, AND ATIVAN 0.5 MG EVERY 6 HOURS PRN AND ZYPREXA 5 MG bid PRN She remains calm, walking in the hallway. But still non verbal     Hypertension:  Well controlled.     Stage 3 b CKD:  Creatinine at baseline.  No new labs .    Debility and deconditioning  - SNF placement.   RN Pressure Injury Documentation:    Malnutrition Type:  Nutrition Problem: Altered GI function Etiology: constipation   Malnutrition Characteristics:  Signs/Symptoms: other (comment) (last BM 10 days ago)   Nutrition Interventions:  Interventions: Other (Comment) (scheduled bowel regimen per MD)  Estimated body mass index is 21.53 kg/m as calculated from the following:   Height as of this encounter: 5\' 6"  (1.676 m).   Weight as of this encounter: 60.5  kg.  Code Status: full code.  DVT Prophylaxis:  enoxaparin (LOVENOX) injection 40 mg Start: 12/26/21 1200   Level of Care: Level of care: Med-Surg Family Communication: none at bedside.   Disposition Plan:     Remains inpatient appropriate:  awaiting placement.   Procedures:  None.   Consultants:   Psychiatry.   Antimicrobials:   Anti-infectives (From admission, onward)    None        Medications  Scheduled Meds:  (feeding supplement) PROSource Plus  30 mL Oral BID BM   vitamin B-12  500 mcg Oral Daily   enoxaparin (LOVENOX) injection  40 mg Subcutaneous Q24H   feeding supplement (KATE FARMS STANDARD 1.4)  325 mL Oral BID BM   haloperidol  2 mg Oral BID   hydrocerin   Topical BID   senna-docusate  2 tablet Oral QHS   Continuous Infusions: PRN Meds:.acetaminophen, alum & mag hydroxide-simeth, fluticasone, hydrOXYzine, LORazepam, magnesium hydroxide, metoprolol tartrate, OLANZapine **OR** OLANZapine, ondansetron, mouth rinse    Subjective:   Natalie Brown was seen and  examined today.   No new complaints.   Objective:   Vitals:   07/14/22 1849 07/14/22 2046 07/15/22 0415 07/15/22 0715  BP: 114/67 113/78 96/82 121/62  Pulse: (!) 102 82 96 97  Resp: 18 17 16 17   Temp: 98.5 F (36.9 C) 98.2 F (36.8 C) 98.4 F (36.9 C) 97.8 F (36.6 C)  TempSrc:  Oral  Oral  SpO2: 99% 100% 93% 98%  Weight:      Height:        Intake/Output Summary (Last 24 hours) at 07/15/2022 1447 Last data filed at 07/15/2022 0910 Gross per 24 hour  Intake 358 ml  Output 1 ml  Net 357 ml    Filed Weights   02/03/22 2025 07/04/22 1100  Weight: 72 kg 60.5 kg     Exam Alert and comfortable,  CVS s1s2,  Lungs clear.  Abd is soft .        Data Reviewed:  I have personally reviewed following labs and imaging studies   CBC Lab Results  Component Value Date   WBC 5.0 07/04/2022   RBC 4.96 07/04/2022   HGB 14.5 07/04/2022   HCT 43.8 07/04/2022   MCV 88.3  07/04/2022   MCH 29.2 07/04/2022   PLT 292 07/04/2022   MCHC 33.1 07/04/2022   RDW 13.8 07/04/2022   LYMPHSABS 1.8 06/03/2022   MONOABS 0.3 06/03/2022   EOSABS 0.2 06/03/2022   BASOSABS 0.0 [id number]     Last metabolic panel Lab Results  Component Value Date   NA 141 07/04/2022   K 4.5 07/04/2022   CL 108 07/04/2022   CO2 23 07/04/2022   BUN 16 07/04/2022   CREATININE 1.18 (H) 07/04/2022   GLUCOSE 101 (H) 07/04/2022   GFRNONAA 55 (L) 07/04/2022   GFRAA 65 09/19/2017   CALCIUM 9.3 07/04/2022   PROT 7.0 07/04/2022   ALBUMIN 3.7 07/04/2022   LABGLOB 2.8 06/21/2020   AGRATIO 1.5 06/21/2020   BILITOT 0.8 07/04/2022   ALKPHOS 52 07/04/2022   AST 21 07/04/2022   ALT 27 07/04/2022   ANIONGAP 10 07/04/2022    CBG (last 3)  No results for input(s): "GLUCAP" in the last 72 hours.    Coagulation Profile: No results for input(s): "INR", "PROTIME" in the last 168 hours.   Radiology Studies: No results found.     Hosie Poisson M.D. [facility] 07/15/2022, 2:47 PM  Available via Epic secure chat 7am-7pm After 7 pm, please refer to night coverage provider listed on amion.

## 2022-07-16 MED ORDER — METOPROLOL TARTRATE 12.5 MG HALF TABLET
12.5000 mg | ORAL_TABLET | Freq: Once | ORAL | Status: AC
  Administered 2022-07-16: 12.5 mg via ORAL
  Filled 2022-07-16: qty 1

## 2022-07-16 NOTE — Progress Notes (Signed)
[facility]                                                                               Natalie Brown, is a 57 y.o. female, DOB - [birth year], PO:[id number] Admit date - 12/24/2021    Outpatient Primary MD for the patient is [name], Natalie Muscat, NP  LOS - 202  days    Brief summary   Natalie Brown is a 57 yo female with PMH bipolar disorder, HTN, recent hospitalization at [facility] who presents to the hospital with altered mentation and tachycardia.   she was admitted for acute metabolic encephalopathy. [name] currently appears to be at baseline.   Assessment & Plan    Assessment and Plan:   Acute metabolic encephalopathy in the setting of chronic progressive dementia;   Neurology and psychiatry consulted and recommendations given.  S/p MRI brain and EEG unrevealing.  ANA negative.  Mental status remains at baseline. [name] mostly non verbal. But walks in the hallway.  [name] currently on haldol 2 mg BID, hydroxyzine 25 mg TID prn, ativan 0.5 mg every hours prn and zyprexa 5 mg BID prn.  No new events . No change in meds.     Bipolar disorder with intermittent agitation:  [name] currently on haldol 2 mg BID, hydroxyzine 25 mg TID prn, AND ATIVAN 0.5 MG EVERY 6 HOURS PRN AND ZYPREXA 5 MG bid PRN She remains calm, walking in the hallway. But still non verbal     Hypertension:  Well controlled.     Stage 3 b CKD:  Creatinine at baseline.  No new labs .    Debility and deconditioning  - SNF placement.   RN Pressure Injury Documentation:    Malnutrition Type:  Nutrition Problem: Altered GI function Etiology: constipation   Malnutrition Characteristics:  Signs/Symptoms: other (comment) (last BM 10 days ago)   Nutrition Interventions:  Interventions: Other (Comment) (scheduled bowel regimen per MD)  Estimated body mass index is 21.53 kg/m as calculated from the following:   Height as of this encounter: 5\' 6"  (1.676 m).   Weight as of this  encounter: 60.5 kg.  Code Status: full code.  DVT Prophylaxis:  enoxaparin (LOVENOX) injection 40 mg Start: 12/26/21 1200   Level of Care: Level of care: Med-Surg Family Communication: none at bedside.   Disposition Plan:     Remains inpatient appropriate:  awaiting placement.   Procedures:  None.   Consultants:   Psychiatry.   Antimicrobials:   Anti-infectives (From admission, onward)    None        Medications  Scheduled Meds:  (feeding supplement) PROSource Plus  30 mL Oral BID BM   vitamin B-12  500 mcg Oral Daily   enoxaparin (LOVENOX) injection  40 mg Subcutaneous Q24H   feeding supplement (KATE FARMS STANDARD 1.4)  325 mL Oral BID BM   haloperidol  2 mg Oral BID   hydrocerin   Topical BID   senna-docusate  2 tablet Oral QHS   Continuous Infusions: PRN Meds:.acetaminophen, alum & mag hydroxide-simeth, fluticasone, hydrOXYzine, LORazepam, magnesium hydroxide, metoprolol tartrate, OLANZapine **OR** OLANZapine, ondansetron, mouth rinse    Subjective:   Levada Dy  Murman was seen and examined today.  No new events.    Objective:   Vitals:   07/16/22 0343 07/16/22 0405 07/16/22 0948 07/16/22 1635  BP:  114/84 (!) 134/57 105/79  Pulse: 94 (!) 109 (!) 57 65  Resp:  16    Temp:   (!) 97.4 F (36.3 C) 98 F (36.7 C)  TempSrc:    Oral  SpO2: 96%  100% 100%  Weight:      Height:        Intake/Output Summary (Last 24 hours) at 07/16/2022 1815 Last data filed at 07/16/2022 1000 Gross per 24 hour  Intake 460 ml  Output --  Net 460 ml    Filed Weights   02/03/22 2025 07/04/22 1100  Weight: 72 kg 60.5 kg     Exam General exam: she appears comfortable.  Respiratory system: Clear to auscultation. Respiratory effort normal. Cardiovascular system: S1 & S2 heard, RRR. Gastrointestinal system: Abdomen is  soft, Central nervous system: Alert , but wound not answer any questions.  Extremities: no pedal edema.  Skin: No rashes,  Psychiatry: unable to  assess.        Data Reviewed:  I have personally reviewed following labs and imaging studies   CBC Lab Results  Component Value Date   WBC 5.0 07/04/2022   RBC 4.96 07/04/2022   HGB 14.5 07/04/2022   HCT 43.8 07/04/2022   MCV 88.3 07/04/2022   MCH 29.2 07/04/2022   PLT 292 07/04/2022   MCHC 33.1 07/04/2022   RDW 13.8 07/04/2022   LYMPHSABS 1.8 06/03/2022   MONOABS 0.3 06/03/2022   EOSABS 0.2 06/03/2022   BASOSABS 0.0 [id number]     Last metabolic panel Lab Results  Component Value Date   NA 141 07/04/2022   K 4.5 07/04/2022   CL 108 07/04/2022   CO2 23 07/04/2022   BUN 16 07/04/2022   CREATININE 1.18 (H) 07/04/2022   GLUCOSE 101 (H) 07/04/2022   GFRNONAA 55 (L) 07/04/2022   GFRAA 65 09/19/2017   CALCIUM 9.3 07/04/2022   PROT 7.0 07/04/2022   ALBUMIN 3.7 07/04/2022   LABGLOB 2.8 06/21/2020   AGRATIO 1.5 06/21/2020   BILITOT 0.8 07/04/2022   ALKPHOS 52 07/04/2022   AST 21 07/04/2022   ALT 27 07/04/2022   ANIONGAP 10 07/04/2022    CBG (last 3)  No results for input(s): "GLUCAP" in the last 72 hours.    Coagulation Profile: No results for input(s): "INR", "PROTIME" in the last 168 hours.   Radiology Studies: No results found.     Hosie Poisson M.D. [facility] 07/16/2022, 6:15 PM  Available via Epic secure chat 7am-7pm After 7 pm, please refer to night coverage provider listed on amion.

## 2022-07-16 NOTE — Progress Notes (Signed)
Rechecked PT's VS, patient was resting in bed, HR 109, BP 114/84. Administered one time dose of metoprolol 12.5 mg PO.  Elza Varricchio

## 2022-07-16 NOTE — Progress Notes (Signed)
Patient was found asleep snoring. Rechecked her HR and it was 94. Metoprolol 12.5 mg one time does was not administer.  Natalie Brown

## 2022-07-17 MED ORDER — ENSURE ENLIVE PO LIQD: 237.0000 mL | Freq: Two times a day (BID) | ORAL | Status: DC | PRN

## 2022-07-17 NOTE — Progress Notes (Signed)
[facility] PROGRESS NOTE  Natalie Brown (DOB: [birth year]) BV:[id number] PCP: Natalie Coots, NP  Brief Narrative: 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  Psychiatry and Neurology evaluated patient during her stay.  Patient is medically stable for discharge and is awaiting placement at a memory care facility.   Major events: 09/07/2021 admitted to behavioral health.  Treated with Zyprexa Invega and Ativan challenge for catatonia. 12/24/2021 sent to ER from [facility] for evaluation for tachycardia 9/04-9/5, 9/5>9/9, LTM EEG  no seizure. TSH stable CTA chest no PE no pneumonia RPR nonreactive bilateral LE Doppler negative for DVT Echo with normal EF  9/23 febrile secondary to Covid 19 + no pneumonia. 9/25 repeat CT brain unremarkable. 05/14/2022 febrile. +RSV.  Subjective: Pt nonverbal, eating 100% of meals at times, but 0% other times. Got up to nurses station earlier. Sitter at bedside states she is very strong and earlier had to be stopped from eating paper.  Objective: BP 114/66 (BP Location: Right Arm)   Pulse 100   Temp 98.1 F (36.7 C) (Oral)   Resp 16   Ht 5\' 6"  (1.676 m)   Wt 60.5 kg   SpO2 100%   BMI 21.53 kg/m   No distress Clear, nonlabored RRR, no MRG or pitting edema Soft, NT, ND, +BS No deformities, normal muscle bulk and tone.   Assessment & Plan: Acute metabolic encephalopathy on chronic progressive dementia: AMS has resolved, pt at cognitive baseline. Neurology and psychiatry consulted and recommendations given.  S/p MRI brain and EEG unrevealing. ANA negative. [name] alert and awake but non-communicative. [name] ambulating the halls throughout the day. [name] able to feed herself. - Continue close supervision as she has unintentionally eloped.  Bipolar disorder: Mood is  stable. - Continue haldol 2mg  BID - Continue olanzapine 5mg  BID prn agitation  - Continue hydroxyzine 25mg  TID prn anxiety - Can also give ativan 0.5mg  po q6h prn anxiety/agitation.    Hypertension: - BP is stable.   Stage IIIa CKD: Stable.  - Check labs periodically. CrCl currently 58ml/min.    Vitamin B12 deficiency: Resolved last B12 1195 on 12/11 - Continue oral supp   Tobacco use: - Continue nicotine patch   Disposition: Prolonged hospitalization pending placement.  TOC following   COVID infection: Resolved. RSV: +RSV on viral panel PCR on 1/23. Resolved symptoms.   Natalie Pour, MD [facility] [email or web address] 07/17/2022, 11:52 AM

## 2022-07-17 NOTE — Progress Notes (Signed)
Nutrition Follow-up  DOCUMENTATION CODES:   Not applicable  INTERVENTION:  - Start daily weights.   - Discontinue [employer] 1.4 PO BID, each supplement provides 455 kcal and 20 grams protein.  - Add Ensure Enlive PRN, each supplement provides 350 kcal and 20 grams of protein.  NUTRITION DIAGNOSIS:   Altered GI function related to constipation as evidenced by other (comment) (last BM 10 days ago).  GOAL:   Other (Comment) (more frequent bowel movements)  MONITOR:   I [facility] O[facility]  REASON FOR ASSESSMENT:   LOS    ASSESSMENT:   57 y.o. female admits related to AMS. PMH includes: Bipolar disorde rand HTN. Pt is currently receiving medical management related to early onset dementia with behavioral disturbances.  Meds reviewed:  Vit B12, senokot. Labs reviewed.   The pt is disoriented x4. Per record, pt has experienced a 15% wt loss in 6 months, which is significant. Pt's intakes have varied over the past 30 days. Spoke with sitter at bedside who reports that the pt did not touch her breakfast tray this am. Pt also had three [employer] at bedside that were untouched. RD will discontinue supplements. Will add Ensure PRN. Will also discontinue Prosource. Sitter reports that some times the pt will clean her plate. Intakes vary from 0-100%. RD discussed plan with MD. Will plan to gather daily weights starting today to monitor weights more closely. Pt could even benefit from an appetite stmulant if appropriate.   Pt not appropriate for NFPE, pt moving around a lot in her bed and disoriented x4 .   Diet Order:   Diet Order             DIET DYS 3 Room service appropriate? Yes; Fluid consistency: Thin  Diet effective now                   EDUCATION NEEDS:   Not appropriate for education at this time  Skin:  Skin Assessment: Reviewed RN Assessment  Last BM:  3/25 - type 6  Height:   Ht Readings from Last 1 Encounters:  02/03/22 5\' 6"  (1.676 m)    Weight:   Wt  Readings from Last 1 Encounters:  07/04/22 60.5 kg    Ideal Body Weight:     BMI:  Body mass index is 21.53 kg/m.  Estimated Nutritional Needs:   Kcal:  1800-2160 kcals  Protein:  90-105 gm  Fluid:  >/= 1.8 L  Thalia Bloodgood, RD, LDN, CNSC.

## 2022-07-17 NOTE — Plan of Care (Signed)

## 2022-07-18 MED ORDER — MIRTAZAPINE 15 MG PO TABS
15.0000 mg | ORAL_TABLET | Freq: Every day | ORAL | Status: DC
  Administered 2022-07-19 – 2022-10-25 (×71): 15 mg via ORAL
  Filled 2022-07-18 (×94): qty 1

## 2022-07-18 NOTE — Plan of Care (Signed)

## 2022-07-18 NOTE — Progress Notes (Signed)
[facility] PROGRESS NOTE  Natalie Brown (DOB: [birth year]) PO:[id number] PCP: Marliss Coots, NP  Brief Narrative: Ms. 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  Psychiatry and Neurology evaluated patient during her stay.  Patient is medically stable for discharge and is awaiting placement at a memory care facility.   Major events: 09/07/2021 admitted to behavioral health.  Treated with Zyprexa Invega and Ativan challenge for catatonia. 12/24/2021 sent to ER from [facility] for evaluation for tachycardia 9/04-9/5, 9/5>9/9, LTM EEG  no seizure. TSH stable CTA chest no PE no pneumonia RPR nonreactive bilateral LE Doppler negative for DVT Echo with normal EF  9/23 febrile secondary to Covid 19 + no pneumonia. 9/25 repeat CT brain unremarkable. 05/14/2022 febrile. +RSV.  Subjective: Has been eating less, usually minimal breakfast.   Objective: BP 123/80 (BP Location: Right Arm)   Pulse 96   Temp 98 F (36.7 C) (Oral)   Resp 18   Ht 5\' 6"  (1.676 m)   Wt 60.5 kg   SpO2 100%   BMI 21.53 kg/m   No distress Clear, nonlabored RRR, no edema Good muscle bulk and tone and strength. Nonverbal, not cooperative.  Assessment & Plan: Acute metabolic encephalopathy on chronic progressive dementia: AMS has resolved, pt at cognitive baseline. Neurology and psychiatry consulted and recommendations given.  S/p MRI brain and EEG unrevealing. ANA negative. [name] alert and awake but non-communicative. [name] ambulating the halls throughout the day. [name] able to feed herself. - Continue close supervision as she has unintentionally eloped.  Unintentional weight loss, inadequate oral intake:  - Continue frequent encouragement. Start remeron qHS to assist with circadian rhythm and titrate upward as tolerated to assist with  appetite stimulation. - Weight monitoring ordered.  Bipolar disorder: Mood is stable. - Continue haldol 2mg  BID - Continue olanzapine 5mg  BID prn agitation  - Continue hydroxyzine 25mg  TID prn anxiety - Can also give ativan 0.5mg  po q6h prn anxiety/agitation.    Hypertension: - BP is stable, normotensive.   Stage IIIa CKD: Stable.  - Check labs periodically. CrCl currently 39ml/min.    Vitamin B12 deficiency: Resolved last B12 1195 on 12/11 - Continue oral supp   Tobacco use: - Continue nicotine patch   Disposition: Prolonged hospitalization pending placement.  TOC following   COVID infection: Resolved.  RSV: +RSV on viral panel PCR on 1/23. Resolved symptoms.   Patrecia Pour, MD [facility] [email or web address] 07/18/2022, 9:24 AM

## 2022-07-19 NOTE — Progress Notes (Signed)
[facility] PROGRESS NOTE  Natalie Brown (DOB: [birth year]) [id number] PCP: Marliss Coots, NP  Brief Narrative: 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  Psychiatry and Neurology evaluated patient during her stay.  Patient is medically stable for discharge and is awaiting placement at a memory care facility.   Major events: 09/07/2021 admitted to behavioral health.  Treated with Zyprexa Invega and Ativan challenge for catatonia. 12/24/2021 sent to ER from [facility] for evaluation for tachycardia 9/04-9/5, 9/5>9/9, LTM EEG  no seizure. TSH stable CTA chest no PE no pneumonia RPR nonreactive bilateral LE Doppler negative for DVT Echo with normal EF  9/23 febrile secondary to Covid 19 + no pneumonia. 9/25 repeat CT brain unremarkable. 05/14/2022 febrile. +RSV.  Subjective: No complaints. She's eating at breakfast. Sitter reports no overnight events.  Objective: BP 109/68 (BP Location: Right Arm)   Pulse 87   Temp 98.4 F (36.9 C) (Oral)   Resp 16   Ht 5\' 6"  (1.676 m)   Wt 60.5 kg   SpO2 100%   BMI 21.53 kg/m   No distress, nonlabored breathing.  Assessment & Plan: Acute metabolic encephalopathy on chronic progressive dementia: AMS has resolved, pt at cognitive baseline. Neurology and psychiatry consulted and recommendations given.  S/p MRI brain and EEG unrevealing. ANA negative. [name] alert and awake but non-communicative. [name] ambulating the halls throughout the day. [name] able to feed herself. - Continue close supervision as she has unintentionally eloped.  Unintentional weight loss, inadequate oral intake:  - Continue frequent encouragement.  - Started remeron qHS 3/28 to assist with circadian rhythm and titrate upward as tolerated to assist with appetite stimulation. - Weight monitoring  ordered.  Bipolar disorder: Mood is stable. - Continue haldol 2mg  BID - Continue olanzapine 5mg  BID prn agitation  - Continue hydroxyzine 25mg  TID prn anxiety - Can also give ativan 0.5mg  po q6h prn anxiety/agitation.    Hypertension: - BP is stable, normotensive.   Stage IIIa CKD: Stable.  - Check labs periodically. CrCl currently 22ml/min.    Vitamin B12 deficiency: Resolved last B12 1195 on 12/11 - Continue oral supp   Tobacco use: - Continue nicotine patch   Disposition: Prolonged hospitalization pending placement.  TOC following   COVID infection: Resolved.  RSV: +RSV on viral panel PCR on 1/23. Resolved symptoms.   Patrecia Pour, MD [facility] [email or web address] 07/19/2022, 11:52 AM

## 2022-07-19 NOTE — Plan of Care (Signed)

## 2022-07-20 NOTE — Plan of Care (Signed)

## 2022-07-20 NOTE — Progress Notes (Signed)
[facility] PROGRESS NOTE  Natalie Brown (DOB: Oct 10, 1965) [id number] PCP: Marliss Coots, NP  Brief Narrative: Ms. 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  Psychiatry and Neurology evaluated patient during her stay.  Patient is medically stable for discharge and is awaiting placement at a memory care facility.   Major events: 09/07/2021 admitted to behavioral health.  Treated with Zyprexa Invega and Ativan challenge for catatonia. 12/24/2021 sent to ER from [facility] for evaluation for tachycardia 9/04-9/5, 9/5>9/9, LTM EEG  no seizure. TSH stable CTA chest no PE no pneumonia RPR nonreactive bilateral LE Doppler negative for DVT Echo with normal EF  9/23 febrile secondary to Covid 19 + no pneumonia. 9/25 repeat CT brain unremarkable. 05/14/2022 febrile. +RSV.  Subjective: No complaints, ate 50% dinner and sleeping still so hasn't eaten breakfast. No new events reported by sitter.  Objective: BP 126/76 (BP Location: Left Arm)   Pulse 100   Temp 98.8 F (37.1 C) (Oral)   Resp 15   Ht 5\' 6"  (1.676 m)   Wt 60.2 kg   SpO2 100%   BMI 21.42 kg/m   No distress, resting quietly.  Assessment & Plan: Acute metabolic encephalopathy on chronic progressive dementia: AMS has resolved, pt at cognitive baseline. Neurology and psychiatry consulted and recommendations given.  S/p MRI brain and EEG unrevealing. ANA negative. [name] alert and awake but non-communicative. [name] ambulating the halls throughout the day. [name] able to feed herself. - Continue close supervision as she has unintentionally eloped.  Unintentional weight loss, inadequate oral intake:  - Continue frequent encouragement.  - Started remeron qHS 3/28 to assist with circadian rhythm and titrate upward as tolerated to assist with appetite  stimulation. Will monitor intake and weights. - Weight monitoring ordered. 60.2kg today.  Bipolar disorder: Mood is stable. - Continue haldol 2mg  BID - Continue olanzapine 5mg  BID prn agitation  - Continue hydroxyzine 25mg  TID prn anxiety - Can also give ativan 0.5mg  po q6h prn anxiety/agitation.    Hypertension: - BP is stable, normotensive.   Stage IIIa CKD: Stable.  - Check labs periodically. CrCl currently 53ml/min.    Vitamin B12 deficiency: Resolved last B12 1195 on 12/11 - Continue oral supp   Tobacco use: - Continue nicotine patch   Disposition: Prolonged hospitalization pending placement.  TOC following   COVID infection: Resolved.  RSV: +RSV on viral panel PCR on 1/23. Resolved symptoms.   Patrecia Pour, MD [facility] [email or web address] 07/20/2022, 10:19 AM

## 2022-07-21 NOTE — Progress Notes (Signed)
Pt has remained awake and impulsive through out the night. Verbally communicated minimally.

## 2022-07-21 NOTE — Progress Notes (Signed)
[facility] PROGRESS NOTE  Natalie Brown (DOB: 06-04-1965) PO:[id number] PCP: Marliss Coots, NP  Brief Narrative: 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  Psychiatry and Neurology evaluated patient during her stay.  Patient is medically stable for discharge and is awaiting placement at a memory care facility.  Subjective: No complaints. She's ambulating freely, eating well.   Objective: BP 124/79   Pulse (!) 126 Comment: Handed over to day RN  Temp 97.9 F (36.6 C) (Oral)   Resp 20   Ht 5\' 6"  (1.676 m)   Wt 61.1 kg   SpO2 98%   BMI 21.76 kg/m   No distress, steady gait, 5/5 strength  Assessment & Plan: Acute metabolic encephalopathy on chronic progressive dementia: AMS has resolved, pt at cognitive baseline. Neurology and psychiatry consulted and recommendations given.  S/p MRI brain and EEG unrevealing. ANA negative. [name] alert and awake but non-communicative. [name] ambulating the halls throughout the day. [name] able to feed herself. - Continue close supervision as she has unintentionally eloped.  Unintentional weight loss, inadequate oral intake:  - Continue frequent encouragement.  - Started remeron qHS 3/28 to assist with circadian rhythm and titrate upward as tolerated to assist with appetite stimulation. Will monitor intake and weights. - Weight monitoring ordered. 61.1kg today.  Bipolar disorder: Mood is stable. - Continue haldol 2mg  BID - Continue olanzapine 5mg  BID prn agitation  - Continue hydroxyzine 25mg  TID prn anxiety - Can also give ativan 0.5mg  po q6h prn anxiety/agitation.    Hypertension: - BP is stable, normotensive.   Stage IIIa CKD: Stable.  - Check labs periodically. CrCl currently 75ml/min.    Vitamin B12 deficiency: Resolved last B12 1195 on 12/11 -  Continue oral supp   Tobacco use: - Continue nicotine patch   Disposition: Prolonged hospitalization pending placement.  TOC following   COVID infection: Resolved.  RSV: +RSV on viral panel PCR on 1/23. Resolved symptoms.   Patrecia Pour, MD [facility] [email or web address] 07/21/2022, 1:53 PM

## 2022-07-22 NOTE — Progress Notes (Signed)
[facility] PROGRESS NOTE  AVIYANNA PIZZOLATO (DOB: 11/27/1965) [id number] PCP: Marliss Coots, NP  Brief Narrative: 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  Psychiatry and Neurology evaluated patient during her stay.  Patient is medically stable for discharge and is awaiting placement at a memory care facility.  Subjective: Resting quietly, ate some breakfast, not much and went back to sleep after ambulating in the hallways today.  Objective: BP 124/79   Pulse (!) 126 Comment: Handed over to day RN  Temp 97.9 F (36.6 C) (Oral)   Resp 20   Ht 5\' 6"  (1.676 m)   Wt 61.1 kg   SpO2 98%   BMI 21.76 kg/m   No distress  Assessment & Plan: Acute metabolic encephalopathy on chronic progressive dementia: AMS has resolved, pt at cognitive baseline. Neurology and psychiatry consulted and recommendations given.  S/p MRI brain and EEG unrevealing. ANA negative. [name] alert and awake but non-communicative. [name] ambulating the halls throughout the day. [name] able to feed herself. - Continue close supervision as she has unintentionally eloped.  Unintentional weight loss, inadequate oral intake:  - Continue frequent encouragement.  - Started remeron qHS 3/28 to assist with circadian rhythm and titrate upward as tolerated to assist with appetite stimulation. Will monitor intake and weights. - Weight monitoring ordered. Pending today.  Bipolar disorder: Mood is stable. - Continue haldol 2mg  BID - Continue olanzapine 5mg  BID prn agitation  - Continue hydroxyzine 25mg  TID prn anxiety - Can also give ativan 0.5mg  po q6h prn anxiety/agitation.    Hypertension: - BP is stable, normotensive.   Stage IIIa CKD: Stable.  - Check labs periodically. CrCl currently 65ml/min.    Vitamin B12 deficiency: Resolved  last B12 1195 on 12/11 - Continue oral supp   Tobacco use: - Continue nicotine patch   Disposition: Prolonged hospitalization pending placement.  TOC following   COVID infection: Resolved.  RSV: +RSV on viral panel PCR on 1/23. Resolved symptoms.   Patrecia Pour, MD [facility] [email or web address] 07/22/2022, 9:35 AM

## 2022-07-23 NOTE — Progress Notes (Signed)
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  Psychiatry and Neurology evaluated patient during her stay.  Patient is medically stable for discharge and is awaiting placement at a memory care facility.  Subjective: Ate a lot of dinner, hasn't eaten breakfast yet this morning. Sitter at bedside familiar with patient states she has been the same.  Objective: BP 110/72 (BP Location: Right Arm)   Pulse 72   Temp 97.8 F (36.6 C)   Resp 17   Ht 5\' 6"  (1.676 m)   Wt 61.1 kg   SpO2 100%   BMI 21.76 kg/m   No distress  Assessment & Plan: Acute metabolic encephalopathy on chronic progressive dementia: AMS has resolved, pt at cognitive baseline. Neurology and psychiatry consulted and recommendations given.  S/p MRI brain and EEG unrevealing. ANA negative. [name] alert and awake but non-communicative. [name] ambulating the halls throughout the day. [name] able to feed herself. - Continue close supervision as she has unintentionally eloped.  Unintentional weight loss, inadequate oral intake:  - Continue frequent encouragement.  - Started remeron qHS 3/28 to assist with circadian rhythm and titrate upward as tolerated to assist with appetite stimulation. Will monitor intake and weights. - Weight monitoring ordered.   Bipolar disorder: Mood is stable. - Continue haldol 2mg  BID - Continue olanzapine 5mg  BID prn agitation  - Continue hydroxyzine 25mg  TID prn anxiety - Can also give ativan 0.5mg  po q6h prn anxiety/agitation.    Hypertension: - BP is stable, normotensive.   Stage IIIa CKD: Stable.  - Check labs periodically. CrCl currently 38ml/min.    Vitamin B12 deficiency: Resolved last  B12 1195 on 12/11 - Continue oral supp   Tobacco use: - Continue nicotine patch   Disposition: Prolonged hospitalization pending placement.  TOC following   COVID infection: Resolved.  RSV: +RSV on viral panel PCR on 1/23. Resolved symptoms.   Natalie Pour, MD [facility] [email or web address] 07/23/2022, 8:58 AM

## 2022-07-23 NOTE — Plan of Care (Signed)
Pt remains awake and impulsive throughout shift.  Remains non-verbal with staff.  VSS throughout shift.  POC maintained, will continue to monitor.  Problem: Education: Goal: Knowledge of General Education information will improve Description: Including pain rating scale, medication(s)/side effects and non-pharmacologic comfort measures Outcome: Progressing   Problem: Health Behavior/Discharge Planning: Goal: Ability to manage health-related needs will improve Outcome: Progressing   Problem: Clinical Measurements: Goal: Ability to maintain clinical measurements within normal limits will improve Outcome: Progressing Goal: Will remain free from infection Outcome: Progressing Goal: Diagnostic test results will improve Outcome: Progressing Goal: Respiratory complications will improve Outcome: Progressing Goal: Cardiovascular complication will be avoided Outcome: Progressing   Problem: Activity: Goal: Risk for activity intolerance will decrease Outcome: Progressing   Problem: Nutrition: Goal: Adequate nutrition will be maintained Outcome: Progressing   Problem: Coping: Goal: Level of anxiety will decrease Outcome: Progressing   Problem: Elimination: Goal: Will not experience complications related to bowel motility Outcome: Progressing Goal: Will not experience complications related to urinary retention Outcome: Progressing   Problem: Pain Managment: Goal: General experience of comfort will improve Outcome: Progressing   Problem: Safety: Goal: Ability to remain free from injury will improve Outcome: Progressing   Problem: Skin Integrity: Goal: Risk for impaired skin integrity will decrease Outcome: Progressing

## 2022-07-24 NOTE — Progress Notes (Signed)
CSW spoke with patient's mother to discuss patient.  There are currently no updates regarding discharge at this time.  Madilyn Fireman, MSW, LCSW Transitions of Care  Clinical Social Worker II [phone]

## 2022-07-24 NOTE — Progress Notes (Signed)
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  Psychiatry and Neurology evaluated patient during her stay.  Patient is medically stable for discharge and is awaiting placement at a memory care facility.  Subjective: Up today drawing, asked tech a question, but none of her other interactions were goal oriented or appropriate.  No fever, no other concerning symptoms.  Objective: BP 114/77 (BP Location: Right Arm)   Pulse 100   Temp 98.7 F (37.1 C) (Oral)   Resp 18   Ht 5\' 6"  (1.676 m)   Wt 61.1 kg   SpO2 100%   BMI 21.76 kg/m   Adult female, lying in bed, sleeping, lung sounds clear, no rales or wheezes, Subsequently she wakes up, tries to take by stethoscope, will not allow further physical exam, appears to make eye contact, makes no spontaneous verbalizations, does not follow commands    Assessment & Plan: Acute metabolic encephalopathy on chronic progressive dementia: -Continue Haldol, as needed Zyprexa, hydroxyzine and lorazepam - Continue mirtazapine  Unintentional weight loss, inadequate oral intake:  -Continue mirtazapine  Bipolar disorder:  See above    Vitamin B12 deficiency: Resolved last B12 1195 on 12/11 -Continue vitamin B12     Edwin Dada, MD [facility] [email or web address] 07/24/2022, 4:03 PM

## 2022-07-25 NOTE — Progress Notes (Addendum)
CSW spoke with Danae Chen at [facility] in [place] who states the facility does not have a locked unit. Danae Chen states that [facility] [place] [facility] has a locked unit.  CSW attempted to reach admissions at [facility] without success - a voicemail was left requesting a return call.  CSW spoke with Amy in admissions at [facility] who states the facility does not have any female beds at this time.  CSW spoke with Network engineer at [facility] who states the facility is full at this time and there are no scheduled discharges at this time.  Madilyn Fireman, MSW, LCSW Transitions of Care  Clinical Social Worker II [phone]

## 2022-07-25 NOTE — Progress Notes (Signed)
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  Psychiatry and Neurology evaluated patient during her stay.  Patient is medically stable for discharge and is awaiting placement at a memory care facility.  Subjective: No change, no nursing concerns  Objective: BP 124/79 (BP Location: Right Arm)   Pulse 88   Temp 98 F (36.7 C) (Oral)   Resp 18   Ht 5\' 6"  (1.676 m)   Wt 61.1 kg   SpO2 98%   BMI 21.76 kg/m   Adult female, lying in bed, sleeping, no acute distress RRR, no murmurs, no peripheral edema Respiratory normal, clear without rales or wheezes As I examining her lungs, she wakes up, and begins to reach for my stethoscope and prevent any further physical exam, she does not respond to questions, does not follow commands, makes no spontaneous verbalizations      Assessment & Plan: Acute metabolic encephalopathy on chronic progressive dementia: -Continue Haldol, mirtazapine - As needed olanzapine, hydroxyzine, and Ativan   Unintentional weight loss, inadequate oral intake:  -Continue mirtazapine  Bipolar disorder:  See above    Vitamin B12 deficiency:   -Continue vitamin B12     Edwin Dada, MD [facility] [email or web address] 07/25/2022, 4:52 PM

## 2022-07-25 NOTE — Progress Notes (Signed)
Initial Nutrition Assessment  DOCUMENTATION CODES:   Not applicable  INTERVENTION:  - Continue Ensure Enlive po BID (PRN), each supplement provides 350 kcal and 20 grams of protein.  NUTRITION DIAGNOSIS:   Altered GI function related to constipation as evidenced by other (comment) (last BM 10 days ago).  GOAL:   Other (Comment) (more frequent bowel movements)  MONITOR:   I & O's  REASON FOR ASSESSMENT:   LOS    ASSESSMENT:   57 y.o. female admits related to AMS. PMH includes: Bipolar disorde rand HTN. Pt is currently receiving medical management related to early onset dementia with behavioral disturbances.  Meds reviewed: Vit B12, remeron, senokot. Labs reviewed: WDL.   The pt ate 100% of her dinner last night. Over the past 7 days intakes range from 25-100%. Pt's last documented BM was on 3/25. The pt is on a scheduled bowel regimen. RD will continue to monitor PO intakes.   Diet Order:   Diet Order             DIET DYS 3 Room service appropriate? Yes; Fluid consistency: Thin  Diet effective now                   EDUCATION NEEDS:   Not appropriate for education at this time  Skin:  Skin Assessment: Reviewed RN Assessment  Last BM:  3/25 - type 6  Height:   Ht Readings from Last 1 Encounters:  02/03/22 5\' 6"  (1.676 m)    Weight:   Wt Readings from Last 1 Encounters:  07/21/22 61.1 kg    Ideal Body Weight:     BMI:  Body mass index is 21.76 kg/m.  Estimated Nutritional Needs:   Kcal:  1800-2160 kcals  Protein:  90-105 gm  Fluid:  >/= 1.8 L  Thalia Bloodgood, RD, LDN, CNSC.

## 2022-07-26 NOTE — Progress Notes (Signed)
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  Psychiatry and Neurology evaluated patient during her stay.  Patient is medically stable for discharge and is awaiting placement at a memory care facility.  Subjective: No change, no nursing concerns  Objective: BP 134/65 (BP Location: Right Arm)   Pulse (!) 138   Temp (!) 97.5 F (36.4 C)   Resp 18   Ht 5\' 6"  (1.676 m)   Wt 63.4 kg   SpO2 100%   BMI 22.56 kg/m   Adult female, lying in bed, no acute distress, playing with her blood pressure cuff RRR, no murmurs, no peripheral edema Respiratory effort appears normal, no rales or wheezing Abdomen no grimace to palpation, does not follow commands, no spontaneous verbalizations      Assessment & Plan: Acute metabolic encephalopathy on chronic progressive dementia: - Haldol and mirtazapine - As needed olanzapine, hydroxyzine, and Ativan         Natalie Sam, MD [facility] [email or web address] 07/26/2022, 3:18 PM

## 2022-07-27 NOTE — Progress Notes (Signed)
[facility] PROGRESS NOTE  Natalie Brown (DOB: 08/17/1965) [id number] PCP: Natalie Sharps, NP  Brief Narrative: 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  Psychiatry and Neurology evaluated patient during her stay.  Patient is medically stable for discharge and is awaiting placement at a memory care facility.  For further details, please see note from Dr. Jarvis Newcomer from 4/2     Subjective: No change, no nursing concerns.  Objective: BP 102/76 (BP Location: Right Arm)   Pulse (!) 103   Temp 98.8 F (37.1 C) (Oral)   Resp 16   Ht 5\' 6"  (1.676 m)   Wt 59.4 kg   SpO2 100%   BMI 21.14 kg/m   Sitting up in bed, nonverbal, trying to feed herself breakfast, appears to be having a difficult time.      Assessment & Plan: Acute metabolic encephalopathy on chronic progressive dementia: - Haldol and mirtazapine - As needed olanzapine, hydroxyzine, and Ativan         Alberteen Sam, MD [facility] [email or web address] 07/27/2022, 11:59 AM

## 2022-07-27 NOTE — Plan of Care (Signed)
  Problem: Education: Goal: Knowledge of General Education information will improve Description: Including pain rating scale, medication(s)/side effects and non-pharmacologic comfort measures 07/27/2022 0310 by Catalino Plascencia, RN Outcome: Progressing 07/27/2022 0309 by Rinda Rollyson, RN Outcome: Progressing   Problem: Health Behavior/Discharge Planning: Goal: Ability to manage health-related needs will improve 07/27/2022 0310 by Shaneya Taketa, RN Outcome: Progressing 07/27/2022 0309 by Logann Whitebread, RN Outcome: Progressing   Problem: Clinical Measurements: Goal: Ability to maintain clinical measurements within normal limits will improve 07/27/2022 0310 by Malika Demario, RN Outcome: Progressing 07/27/2022 0309 by Judyth Demarais, RN Outcome: Progressing Goal: Will remain free from infection 07/27/2022 0310 by Gwynevere Lizana, RN Outcome: Progressing 07/27/2022 0309 by Evalene Vath, RN Outcome: Progressing Goal: Diagnostic test results will improve 07/27/2022 0310 by Atleigh Gruen, RN Outcome: Progressing 07/27/2022 0309 by Tayra Dawe, RN Outcome: Progressing Goal: Respiratory complications will improve 07/27/2022 0310 by Jerell Demery, RN Outcome: Progressing 07/27/2022 0309 by Tana Trefry, RN Outcome: Progressing Goal: Cardiovascular complication will be avoided 07/27/2022 0310 by Peyton Spengler, RN Outcome: Progressing 07/27/2022 0309 by Kavish Lafitte, RN Outcome: Progressing   Problem: Activity: Goal: Risk for activity intolerance will decrease 07/27/2022 0310 by Damario Gillie, RN Outcome: Progressing 07/27/2022 0309 by Ailsa Mireles, RN Outcome: Progressing   Problem: Nutrition: Goal: Adequate nutrition will be maintained 07/27/2022 0310 by Eryn Krejci, RN Outcome: Progressing 07/27/2022 0309 by Montasia Chisenhall, RN Outcome: Progressing   Problem: Coping: Goal: Level of anxiety will decrease 07/27/2022 0310 by Rexford Prevo, RN Outcome: Progressing 07/27/2022 0309 by Moxie Kalil, RN Outcome: Progressing   Problem: Elimination: Goal: Will not  experience complications related to bowel motility 07/27/2022 0310 by Lilianah Buffin, RN Outcome: Progressing 07/27/2022 0309 by Ramesha Poster, RN Outcome: Progressing Goal: Will not experience complications related to urinary retention 07/27/2022 0310 by Odis Wickey, RN Outcome: Progressing 07/27/2022 0309 by Kasch Borquez, RN Outcome: Progressing   Problem: Pain Managment: Goal: General experience of comfort will improve 07/27/2022 0310 by Shawndell Varas, RN Outcome: Progressing 07/27/2022 0309 by Kerman Pfost, RN Outcome: Progressing   Problem: Safety: Goal: Ability to remain free from injury will improve 07/27/2022 0310 by Jarrett Albor, RN Outcome: Progressing 07/27/2022 0309 by Mashanda Ishibashi, RN Outcome: Progressing   Problem: Skin Integrity: Goal: Risk for impaired skin integrity will decrease 07/27/2022 0310 by Glendy Barsanti, RN Outcome: Progressing 07/27/2022 0309 by Rannie Craney, RN Outcome: Progressing

## 2022-07-28 NOTE — Progress Notes (Signed)
[facility] PROGRESS NOTE  IZOLA NETTER (DOB: 1966/01/01) [id number] PCP: Lavinia Sharps, NP  Brief Narrative: Ms. 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  Psychiatry and Neurology evaluated patient during her stay.  Patient is medically stable for discharge and is awaiting placement at a memory care facility.  For further details, please see note from Dr. Jarvis Newcomer from 4/2     Subjective: No change, no nursing concerns  Objective: BP 102/76 (BP Location: Right Arm)   Pulse (!) 103   Temp 98.8 F (37.1 C) (Oral)   Resp 16   Ht 5\' 6"  (1.676 m)   Wt 59.4 kg   SpO2 100%   BMI 21.14 kg/m   Sleeping, no acute distress      Assessment & Plan: Acute metabolic encephalopathy on chronic progressive dementia: - Continue Haldol and mirtazapine - As needed olanzapine, hydroxyzine, and Ativan         Alberteen Sam, MD [facility] [email or web address] 07/28/2022, 5:10 PM

## 2022-07-29 NOTE — Progress Notes (Signed)
[facility] PROGRESS NOTE  Natalie Brown (DOB: [birth year]) [id number] PCP: Lavinia Sharps, NP  Brief Narrative: 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  Psychiatry and Neurology evaluated patient during her stay.  Patient is medically stable for discharge and is awaiting placement at a memory care facility.  For further details, please see note from Dr. Jarvis Newcomer from 4/2     Subjective: No change, no nursing concerns  Objective: BP 101/80 (BP Location: Right Arm)   Pulse 88   Temp 98.1 F (36.7 C) (Oral)   Resp 18   Ht 5\' 6"  (1.676 m)   Wt 59.4 kg   SpO2 100%   BMI 21.14 kg/m   Sleeping, no acute distress      Assessment & Plan: Acute metabolic encephalopathy on chronic progressive dementia: - Continue Haldol and mirtazapine - As needed olanzapine, hydroxyzine, and Ativan         Alberteen Sam, MD [facility] [email or web address] 07/29/2022, 6:08 PM

## 2022-07-30 NOTE — Progress Notes (Signed)
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  Psychiatry and Neurology evaluated patient during her stay.  Patient is medically stable for discharge and is awaiting placement at a memory care facility.  For further details, please see note from Dr. Jarvis Brown from 4/2     Subjective: No change, no nursing concerns  Objective: BP 90/65 (BP Location: Right Arm)   Pulse 98   Temp 98.1 F (36.7 C) (Oral)   Resp 18   Ht 5\' 6"  (1.676 m)   Wt 59.4 kg   SpO2 100%   BMI 21.14 kg/m   Sitting up eating, appears rigid and stiff.  Nonverbal.  Does not acknowledge my presence.  No change from preivous.      Assessment & Plan: Acute metabolic encephalopathy on chronic progressive dementia: - Continue Haldol and mirtazapine - As needed olanzapine, hydroxyzine, and Ativan         Natalie Sam, MD [facility] [email or web address] 07/30/2022, 6:19 PM

## 2022-07-30 NOTE — Plan of Care (Signed)
  Problem: Health Behavior/Discharge Planning: Goal: Ability to manage health-related needs will improve Outcome: Not Progressing   Problem: Safety: Goal: Ability to remain free from injury will improve Outcome: Not Progressing   

## 2022-07-31 DIAGNOSIS — R000 Tachycardia, unspecified: Secondary | ICD-10-CM

## 2022-07-31 NOTE — Plan of Care (Signed)
  Problem: Nutrition: Goal: Adequate nutrition will be maintained Outcome: Not Progressing   Problem: Coping: Goal: Level of anxiety will decrease Outcome: Not Progressing   Problem: Safety: Goal: Ability to remain free from injury will improve Outcome: Not Progressing   

## 2022-07-31 NOTE — Progress Notes (Signed)
[facility]                                                                              Natalie Brown, is a 57 y.o. female, DOB - 1965-08-08, [id number] Admit date - 12/24/2021    Outpatient Primary MD for the patient is [name], [name], NP  LOS - 217  days  No chief complaint on file.      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  Psychiatry and Neurology evaluated patient during her stay.  Patient is medically stable for discharge and is awaiting placement at a memory care facility.    Assessment & Plan    Principal Problem:  Acute metabolic encephalopathy on chronic progressive dementia: -Seen by neurology and psychiatry -Status post MRI brain, EEG unrevealing, ANA negative -Continue Haldol, as needed Zyprexa, hydroxyzine and lorazepam - Continue mirtazapine   Unintentional weight loss, inadequate oral intake:  -Continue mirtazapine -Continue dysphagia 3 diet   Bipolar disorder:  - Continue Haldol 2 mg BID, olanzapine as needed, Atarax as needed -Continue Ativan 0.5 mg p.o. every 6 hours as needed for anxiety/agitation    Vitamin B12 deficiency: Resolved last B12 1195 on 12/11 -Continue vitamin B12    Essential hypertension -BP stable  CKD stage IIIa -Creatinine stable, creatinine was 1.18 on 3/14 -Recheck labs in a.m.  Moderate protein calorie malnutrition Estimated body mass index is 21.14 kg/m as calculated from the following:   Height as of this encounter: 5\' 6"  (1.676 m).   Weight as of this encounter: 59.4 kg.  Code Status: Full code DVT Prophylaxis:  enoxaparin (LOVENOX) injection 40 mg Start: 12/26/21 1200   Level of Care: Level of care: Med-Surg Family Communication:  Disposition Plan:      Remains inpatient appropriate:   Awaiting  memory care facility   Procedures:  EEG  Consultants:   Neurology Psychiatry  Antimicrobials:   Medications  vitamin B-12  500 mcg Oral Daily   enoxaparin (LOVENOX) injection  40 mg Subcutaneous Q24H   haloperidol  2 mg Oral BID   hydrocerin   Topical BID   mirtazapine  15 mg Oral QHS   senna-docusate  2 tablet Oral QHS      Subjective:   Natalie Brown was seen and examined today.  No acute issues, no nausea vomiting, fevers.  Did not sleep well last night.  Objective:   Vitals:   07/29/22 1615 07/30/22 0918 07/30/22 2002 07/31/22 0623  BP:  90/65 119/83 93/63  Pulse:  98 92 72  Resp: 18  16 16   Temp: 98.1 F (36.7 C)  98.5 F (36.9 C) 98.1 F (36.7 C)  TempSrc: Oral  Axillary Axillary  SpO2:  100% 100% 100%  Weight:      Height:        Intake/Output Summary (Last 24 hours) at 07/31/2022 1020 Last data filed at 07/30/2022 2200 Gross per 24 hour  Intake 118 ml  Output --  Net 118 ml     Wt Readings from Last 3 Encounters:  07/28/22 59.4 kg  12/24/21 72 kg  09/05/21 72.6 kg     Exam General: Sleepy, NAD, Cardiovascular: S1 S2 auscultated,  RRR Respiratory: CTAB Gastrointestinal: Soft, NT, ND Ext: no pedal edema bilaterally Neuro:  Psych: sleepy    Data Reviewed:  I have personally reviewed following labs    CBC Lab Results  Component Value Date   WBC 5.0 07/04/2022   RBC 4.96 07/04/2022   HGB 14.5 07/04/2022   HCT 43.8 07/04/2022   MCV 88.3 07/04/2022   MCH 29.2 07/04/2022   PLT 292 07/04/2022   MCHC 33.1 07/04/2022   RDW 13.8 07/04/2022   LYMPHSABS 1.8 06/03/2022   MONOABS 0.3 06/03/2022   EOSABS 0.2 06/03/2022   BASOSABS 0.0 06/03/2022     Last metabolic panel Lab Results  Component Value Date   NA 141 07/04/2022   K 4.5 07/04/2022   CL 108 07/04/2022   CO2 23 07/04/2022   BUN 16 07/04/2022   CREATININE 1.18 (H) 07/04/2022   GLUCOSE 101 (H) 07/04/2022   GFRNONAA 55 (L) 07/04/2022   GFRAA 65 09/19/2017   CALCIUM 9.3  07/04/2022   PROT 7.0 07/04/2022   ALBUMIN 3.7 07/04/2022   LABGLOB 2.8 06/21/2020   AGRATIO 1.5 06/21/2020   BILITOT 0.8 07/04/2022   ALKPHOS 52 07/04/2022   AST 21 07/04/2022   ALT 27 07/04/2022   ANIONGAP 10 07/04/2022    CBG (last 3)  No results for input(s): "GLUCAP" in the last 72 hours.    Coagulation Profile: No results for input(s): "INR", "PROTIME" in the last 168 hours.   Radiology Studies: I have personally reviewed the imaging studies  No results found.     Thad Ranger M.D. [facility] 07/31/2022, 10:20 AM  Available via Epic secure chat 7am-7pm After 7 pm, please refer to night coverage provider listed on amion.

## 2022-08-01 NOTE — Progress Notes (Signed)
[facility]                                                                              Natalie Brown, is a 57 y.o. female, DOB - January 20, 1966, PHX:[id number] Admit date - 12/24/2021    Outpatient Primary MD for the patient is [name], [name], NP  LOS - 218  days  No chief complaint on file.      Brief summary   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  Psychiatry and Neurology evaluated patient during her stay.  Patient is medically stable for discharge and is awaiting placement at a memory care facility.    Assessment & Plan    Principal Problem:  Acute metabolic encephalopathy on chronic progressive dementia: -Seen by neurology and psychiatry -Status post MRI brain, EEG unrevealing, ANA negative -Continue Haldol, as needed Zyprexa, hydroxyzine and lorazepam - Continue mirtazapine   Unintentional weight loss, inadequate oral intake:  -Continue mirtazapine -Continue dysphagia 3 diet   Bipolar disorder:  - Continue Haldol 2 mg BID, olanzapine as needed, Atarax as needed -Continue Ativan 0.5 mg p.o. every 6 hours as needed for anxiety/agitation    Vitamin B12 deficiency: Resolved last B12 1195 on 12/11 -Continue vitamin B12    Essential hypertension -BP stable  CKD stage IIIa -Creatinine stable, creatinine was 1.18 on 3/14 -labs pending .  Moderate protein calorie malnutrition Estimated body mass index is 20.89 kg/m as calculated from the following:   Height as of this encounter: 5\' 6"  (1.676 m).   Weight as of this encounter: 58.7 kg.  Code Status: Full code DVT Prophylaxis:  enoxaparin (LOVENOX) injection 40 mg Start: 12/26/21 1200   Level of Care: Level of care: Med-Surg Family Communication:  Disposition Plan:      Remains inpatient appropriate:   Awaiting  memory care facility   Procedures:  EEG  Consultants:   Neurology Psychiatry  Antimicrobials:   Medications  vitamin B-12  500 mcg Oral Daily   enoxaparin (LOVENOX) injection  40 mg Subcutaneous Q24H   haloperidol  2 mg Oral BID   hydrocerin   Topical BID   mirtazapine  15 mg Oral QHS   senna-docusate  2 tablet Oral QHS      Subjective:   Natalie Brown was seen and examined today.  Much more alert and awake today, eating breakfast by herself without any assistance.  Not conversant.  Slept well last night.  Sitter at the bedside  Objective:   Vitals:   07/31/22 1104 07/31/22 1802 07/31/22 1913 08/01/22 0405  BP: 106/71 117/80 (!) 115/91 93/66  Pulse: 98 94 94 70  Resp: 18 18 20 18   Temp: 98.6 F (37 C) (!) 97.5 F (36.4 C) 97.9 F (36.6 C) 97.6 F (36.4 C)  TempSrc: Oral  Oral Axillary  SpO2: 100% 100% 100% 100%  Weight:    58.7 kg  Height:        Intake/Output Summary (Last 24 hours) at 08/01/2022 1018 Last  data filed at 08/01/2022 0857 Gross per 24 hour  Intake 240 ml  Output 0 ml  Net 240 ml     Wt Readings from Last 3 Encounters:  08/01/22 58.7 kg  12/24/21 72 kg  09/05/21 72.6 kg   Physical Exam General: Alert and awake, intermittent smiles but no conversation Cardiovascular: S1 S2 clear, RRR.  Respiratory: CTAB, no wheezing Gastrointestinal: Soft, nontender, nondistended, NBS Ext: no pedal edema bilaterally Neuro: eating breakfast without any assistance, moving all 4 extremities spontaneously Psych: and awake but not conversing   Data Reviewed:  I have personally reviewed following labs    CBC Lab Results  Component Value Date   WBC 5.0 07/04/2022   RBC 4.96 07/04/2022   HGB 14.5 07/04/2022   HCT 43.8 07/04/2022   MCV 88.3 07/04/2022   MCH 29.2 07/04/2022   PLT 292 07/04/2022   MCHC 33.1 07/04/2022   RDW 13.8 07/04/2022   LYMPHSABS 1.8 06/03/2022   MONOABS 0.3 06/03/2022   EOSABS 0.2 06/03/2022   BASOSABS 0.0 06/03/2022      Last metabolic panel Lab Results  Component Value Date   NA 141 07/04/2022   K 4.5 07/04/2022   CL 108 07/04/2022   CO2 23 07/04/2022   BUN 16 07/04/2022   CREATININE 1.18 (H) 07/04/2022   GLUCOSE 101 (H) 07/04/2022   GFRNONAA 55 (L) 07/04/2022   GFRAA 65 09/19/2017   CALCIUM 9.3 07/04/2022   PROT 7.0 07/04/2022   ALBUMIN 3.7 07/04/2022   LABGLOB 2.8 06/21/2020   AGRATIO 1.5 06/21/2020   BILITOT 0.8 07/04/2022   ALKPHOS 52 07/04/2022   AST 21 07/04/2022   ALT 27 07/04/2022   ANIONGAP 10 07/04/2022    CBG (last 3)  No results for input(s): "GLUCAP" in the last 72 hours.    Coagulation Profile: No results for input(s): "INR", "PROTIME" in the last 168 hours.   Radiology Studies: I have personally reviewed the imaging studies  No results found.     Thad Ranger M.D. [facility] 08/01/2022, 10:18 AM  Available via Epic secure chat 7am-7pm After 7 pm, please refer to night coverage provider listed on amion.

## 2022-08-01 NOTE — Progress Notes (Signed)
Nutrition Follow-up  DOCUMENTATION CODES:   Not applicable  INTERVENTION:  - Continue Ensure Enlive po BID, each supplement provides 350 kcal and 20 grams of protein. (PRN)  NUTRITION DIAGNOSIS:   Altered GI function related to constipation as evidenced by other (comment) (last BM 10 days ago). - resolving  GOAL:   Other (Comment) (more frequent bowel movements) - met, ongoing.   MONITOR:   I & O[facility]  REASON FOR ASSESSMENT:   LOS    ASSESSMENT:   57 y.o. female admits related to AMS. PMH includes: Bipolar disorde rand HTN. Pt is currently receiving medical management related to early onset dementia with behavioral disturbances.  Meds reviewed:  Vit B12, remeron, senokot. Labs reviewed: Creatinine elevated.   The pt has been eating 60-100% of her meals over the past 7 days. Pt has experienced wt loss since admission, but only 2% over the past month which is not significant. Will continue to monitor.   Diet Order:   Diet Order             DIET DYS 3 Room service appropriate? Yes; Fluid consistency: Thin  Diet effective now                   EDUCATION NEEDS:   Not appropriate for education at this time  Skin:  Skin Assessment: Reviewed RN Assessment  Last BM:  07/30/22  Height:   Ht Readings from Last 1 Encounters:  02/03/22 5\' 6"  (1.676 m)    Weight:   Wt Readings from Last 1 Encounters:  08/01/22 58.7 kg    Ideal Body Weight:     BMI:  Body mass index is 20.89 kg/m.  Estimated Nutritional Needs:   Kcal:  1800-2160 kcals  Protein:  90-105 gm  Fluid:  >/= 1.8 L  Bethann Humble, RD, LDN, CNSC.

## 2022-08-02 LAB — CBC
HCT: 44.2 % (ref 36.0–46.0)
Hemoglobin: 14.3 g/dL (ref 12.0–15.0)
MCH: 28.9 pg (ref 26.0–34.0)
MCHC: 32.4 g/dL (ref 30.0–36.0)
MCV: 89.5 fL (ref 80.0–100.0)
Platelets: 275 10*3/uL (ref 150–400)
RBC: 4.94 MIL/uL (ref 3.87–5.11)
RDW: 13.4 % (ref 11.5–15.5)
WBC: 5.1 10*3/uL (ref 4.0–10.5)
nRBC: 0 % (ref 0.0–0.2)

## 2022-08-02 LAB — BASIC METABOLIC PANEL
Anion gap: 9 (ref 5–15)
BUN: 17 mg/dL (ref 6–20)
CO2: 25 mmol/L (ref 22–32)
Calcium: 9 mg/dL (ref 8.9–10.3)
Chloride: 105 mmol/L (ref 98–111)
Creatinine, Ser: 1.19 mg/dL — ABNORMAL HIGH (ref 0.44–1.00)
GFR, Estimated: 54 mL/min — ABNORMAL LOW (ref >60)
Glucose, Bld: 82 mg/dL (ref 70–99)
Potassium: 4 mmol/L (ref 3.5–5.1)
Sodium: 139 mmol/L (ref 135–145)

## 2022-08-02 NOTE — NC FL2 (Addendum)
[place] MEDICAID FL2 LEVEL OF CARE FORM     IDENTIFICATION  Patient Name: Natalie Brown Birthdate: 05/27/1965 Sex: female Admission Date (Current Location): 12/24/2021  [place] and [place] Number:  [facility] Bast [id number] [facility] Facility and Address:  [place], [address], [place], [place] [address]      Provider Number: [id number]  Attending Physician Name and Address:  Ollen Bowl, MD  Relative Name and Phone Number:       Current Level of Care: Hospital Recommended Level of Care: Memory Care Prior Approval Number:    Date Approved/Denied:   PASRR Number:    Discharge Plan: Memory Care    Current Diagnoses: Patient Active Problem List   Diagnosis Date Noted   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) 11/11/2021   COVID-19 virus infection 02/19/2022   Drooling 02/19/2022   Essential hypertension 02/19/2022   Altered mental state 12/26/2021   Sinus tachycardia 12/25/2021   Tobacco abuse 12/24/2021   Delirium due to multiple etiologies, acute, hyperactive 12/24/2021   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) 11/11/2021   Bipolar affective disorder, current episode manic with psychotic symptoms 09/12/2021   Dental caries 06/29/2015   Cervical cancer screening 02/22/2014   Current smoker 02/22/2014   Urge incontinence of urine 12/30/2013   Need for prophylactic vaccination and inoculation against influenza 12/30/2013   Bipolar 1 disorder 12/30/2013    Orientation RESPIRATION BLADDER Height & Weight     Self  Normal Continent Weight: 128 lb 1.4 oz (58.1 kg) Height:   (167.6 cm)  BEHAVIORAL SYMPTOMS/MOOD NEUROLOGICAL BOWEL NUTRITION STATUS  Other (Comment) (Dementia, no behaviors or aggression)   Continent Diet (Regular diet)  AMBULATORY STATUS COMMUNICATION OF NEEDS Skin   Supervision Verbally Normal                       Personal Care Assistance Level of Assistance  Bathing, Feeding,  Dressing Bathing Assistance: Maximum assistance Feeding assistance: Maximum assistance Dressing Assistance: Maximum assistance     Functional Limitations Info  Hearing, Speech, Sight Sight Info: Adequate Hearing Info: Adequate Speech Info: Adequate    SPECIAL CARE FACTORS FREQUENCY                       Contractures Contractures Info: Not present    Additional Factors Info  Code Status, Allergies, Psychotropic Code Status Info: Full Code Allergies Info: Penicillins Psychotropic Info: Ativan         Current Medications (08/02/2022):  This is the current hospital active medication list Current Facility-Administered Medications  Medication Dose Route Frequency Provider Last Rate Last Admin   acetaminophen (TYLENOL) tablet 650 mg  650 mg Oral Q6H PRN Orland Mustard, MD   650 mg at 06/14/22 0828   alum & mag hydroxide-simeth (MAALOX/MYLANTA) 200-200-20 MG/5ML suspension 30 mL  30 mL Oral Q4H PRN Orland Mustard, MD       cyanocobalamin (VITAMIN B12) tablet 500 mcg  500 mcg Oral Daily Hazeline Junker B, MD   500 mcg at 08/02/22 0952   enoxaparin (LOVENOX) injection 40 mg  40 mg Subcutaneous Q24H Maretta Bees, MD   40 mg at 08/01/22 1338   feeding supplement (ENSURE ENLIVE / ENSURE PLUS) liquid 237 mL  237 mL Oral BID PRN Tyrone Nine, MD       fluticasone (FLONASE) 50 MCG/ACT nasal spray 1 spray  1 spray Each Nare Daily PRN Orland Mustard, MD  haloperidol (HALDOL) tablet 2 mg  2 mg Oral BID [place], Eric J, DO   2 mg at 08/02/22 1610   hydrocerin (EUCERIN) cream   Topical BID Orland Mustard, MD   Given at 08/01/22 0902   hydrOXYzine (ATARAX) tablet 25 mg  25 mg Oral TID PRN Leatha Gilding, MD   25 mg at 07/22/22 0606   LORazepam (ATIVAN) tablet 0.5 mg  0.5 mg Oral Q6H PRN Leatha Gilding, MD   0.5 mg at 07/29/22 0424   magnesium hydroxide (MILK OF MAGNESIA) suspension 30 mL  30 mL Oral Daily PRN Orland Mustard, MD       metoprolol tartrate (LOPRESSOR) injection 5  mg  5 mg Intravenous Q8H PRN Leroy Sea, MD   5 mg at 01/02/22 1857   mirtazapine (REMERON) tablet 15 mg  15 mg Oral QHS Hazeline Junker B, MD   15 mg at 08/01/22 2043   OLANZapine (ZYPREXA) tablet 5 mg  5 mg Oral BID PRN Cinderella, Margaret A   5 mg at 06/27/22 0731   Or   OLANZapine (ZYPREXA) injection 5 mg  5 mg Intramuscular BID PRN Cinderella, Margaret A       ondansetron [facility]) tablet 4 mg  4 mg Oral Q8H PRN Orland Mustard, MD   4 mg at 03/17/22 0139   Oral care mouth rinse  15 mL Mouth Rinse PRN Leroy Sea, MD       senna-docusate (Senokot-S) tablet 2 tablet  2 tablet Oral QHS Leatha Gilding, MD   2 tablet at 08/01/22 2043     Discharge Medications: Please see discharge summary for a list of discharge medications.  Relevant Imaging Results:  Relevant Lab Results:   Additional Information SSN: [id number]  Inis S[place], [place]

## 2022-08-02 NOTE — Progress Notes (Signed)
PROGRESS NOTE    Natalie Brown  MLJ:[id number] DOB: [birth year] DOA: 12/24/2021 PCP: Lavinia Sharps, NP   Brief Narrative:  Ms. 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  Psychiatry and Neurology evaluated patient during her stay.  Patient is medically stable for discharge and is awaiting placement at a memory care facility.   Assessment & Plan:   Acute metabolic encephalopathy on chronic progressive dementia: -Seen by neurology and psychiatry -Status post MRI brain, EEG unrevealing, ANA negative -Continue Haldol, as needed Zyprexa, hydroxyzine and lorazepam -Continue mirtazapine   Unintentional weight loss, inadequate oral intake:  -Continue mirtazapine -Continue dysphagia 3 diet   Bipolar disorder:  - Continue Haldol 2 mg BID, olanzapine as needed, Atarax as needed -Continue Ativan 0.5 mg p.o. every 6 hours as needed for anxiety/agitation    Vitamin B12 deficiency: Resolved last B12 1195 on 12/11 -Continue vitamin B12    Essential hypertension -BP stable   CKD stage IIIa -Creatinine stable, creatinine was 1.18 on 3/14   Moderate protein calorie malnutrition Estimated body mass index is 20.89 kg/m as calculated from the following: -Continue Ensure  DVT prophylaxis: Lovenox Code Status: Full code Family Communication:  None present at bedside.   Disposition Plan: Awaiting SNF placement  Consultants:  Neurology Psychiatry  Procedures:  EEG  Status is: Inpatient   Subjective: Patient seen and examined.  Sitter at the bedside.  Alert but nonverbal, no acute events overnight.  Objective: Vitals:   08/01/22 0405 08/01/22 2216 08/02/22 0530 08/02/22 0614  BP: 93/66 101/68  115/62  Pulse: 70 74  96  Resp: 18 18  18   Temp: 97.6 F (36.4 C) 98.1 F (36.7 C)  98.4 F (36.9 C)   TempSrc: Axillary Oral  Oral  SpO2: 100% 100%  100%  Weight: 58.7 kg  58.1 kg   Height:        Intake/Output Summary (Last 24 hours) at 08/02/2022 1104 Last data filed at 08/02/2022 0749 Gross per 24 hour  Intake 30 ml  Output --  Net 30 ml   Filed Weights   07/28/22 0500 08/01/22 0405 08/02/22 0530  Weight: 59.4 kg 58.7 kg 58.1 kg    Examination:  General exam: Appears calm and comfortable, on room air Respiratory system: Clear to auscultation. Respiratory effort normal. Cardiovascular system: S1 & S2 heard, RRR. No JVD, murmurs, rubs, gallops or clicks. No pedal edema. Gastrointestinal system: Abdomen is nondistended, soft and nontender. No organomegaly or masses felt. Normal bowel sounds heard. Central nervous system: Alert, nonverbal  skin: No rashes, lesions or ulcers   Data Reviewed: I have personally reviewed following labs and imaging studies  CBC: No results for input(s): "WBC", "NEUTROABS", "HGB", "HCT", "MCV", "PLT" in the last 168 hours. Basic Metabolic Panel: No results for input(s): "NA", "K", "CL", "CO2", "GLUCOSE", "BUN", "CREATININE", "CALCIUM", "MG", "PHOS" in the last 168 hours. GFR: CrCl cannot be calculated (Patient's most recent lab result is older than the maximum 21 days allowed.). Liver Function Tests: No results for input(s): "AST", "ALT", "ALKPHOS", "BILITOT", "PROT", "ALBUMIN" in the last 168 hours. No results for input(s): "LIPASE", "AMYLASE" in the last 168 hours. No results for input(s): "AMMONIA" in the last 168 hours. Coagulation Profile: No results for input(s): "INR", "PROTIME" in the last 168 hours. Cardiac Enzymes: No results for input(s): "CKTOTAL", "CKMB", "CKMBINDEX", "TROPONINI" in  the last 168 hours. BNP (last 3 results) No results for input(s): "PROBNP" in the last 8760 hours. HbA1C: No results for input(s): "HGBA1C" in the last 72 hours. CBG: No results for input(s): "GLUCAP" in the last 168 hours. Lipid Profile: No  results for input(s): "CHOL", "HDL", "LDLCALC", "TRIG", "CHOLHDL", "LDLDIRECT" in the last 72 hours. Thyroid Function Tests: No results for input(s): "TSH", "T4TOTAL", "FREET4", "T3FREE", "THYROIDAB" in the last 72 hours. Anemia Panel: No results for input(s): "VITAMINB12", "FOLATE", "FERRITIN", "TIBC", "IRON", "RETICCTPCT" in the last 72 hours. Sepsis Labs: No results for input(s): "PROCALCITON", "LATICACIDVEN" in the last 168 hours.  No results found for this or any previous visit (from the past 240 hour(s)).    Radiology Studies: No results found.  Scheduled Meds:  vitamin B-12  500 mcg Oral Daily   enoxaparin (LOVENOX) injection  40 mg Subcutaneous Q24H   haloperidol  2 mg Oral BID   hydrocerin   Topical BID   mirtazapine  15 mg Oral QHS   senna-docusate  2 tablet Oral QHS   Continuous Infusions:   LOS: 219 days   Time spent: 25 minutes   Marieanne Marxen Estill Cotta, MD [facility]  If 7PM-7AM, please contact night-coverage [email or web address] 08/02/2022, 11:04 AM

## 2022-08-03 LAB — CBC
HCT: 39.2 % (ref 36.0–46.0)
Hemoglobin: 13.3 g/dL (ref 12.0–15.0)
MCH: 29.5 pg (ref 26.0–34.0)
MCHC: 33.9 g/dL (ref 30.0–36.0)
MCV: 86.9 fL (ref 80.0–100.0)
Platelets: 269 10*3/uL (ref 150–400)
RBC: 4.51 MIL/uL (ref 3.87–5.11)
RDW: 13.3 % (ref 11.5–15.5)
WBC: 4.3 10*3/uL (ref 4.0–10.5)
nRBC: 0 % (ref 0.0–0.2)

## 2022-08-03 LAB — BASIC METABOLIC PANEL
Anion gap: 11 (ref 5–15)
BUN: 15 mg/dL (ref 6–20)
CO2: 22 mmol/L (ref 22–32)
Calcium: 9 mg/dL (ref 8.9–10.3)
Chloride: 107 mmol/L (ref 98–111)
Creatinine, Ser: 1.07 mg/dL — ABNORMAL HIGH (ref 0.44–1.00)
GFR, Estimated: >60 mL/min (ref >60)
Glucose, Bld: 95 mg/dL (ref 70–99)
Potassium: 4 mmol/L (ref 3.5–5.1)
Sodium: 140 mmol/L (ref 135–145)

## 2022-08-03 NOTE — Progress Notes (Signed)
PROGRESS NOTE    Natalie Brown  [id number]:[id number] DOB: 1965-10-13 DOA: 12/24/2021 PCP: Lavinia Sharps, NP   Brief Narrative:  Ms. 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  Psychiatry and Neurology evaluated patient during her stay.  Patient is medically stable for discharge and is awaiting placement at a memory care facility.   Assessment & Plan:   Acute metabolic encephalopathy on chronic progressive dementia: -Seen by neurology and psychiatry -Status post MRI brain, EEG unrevealing, ANA negative -Continue Haldol, as needed Zyprexa, hydroxyzine and lorazepam -Continue mirtazapine   Unintentional weight loss, inadequate oral intake:  -Continue mirtazapine -Continue dysphagia 3 diet   Bipolar disorder:  - Continue Haldol 2 mg BID, olanzapine as needed, Atarax as needed -Continue Ativan 0.5 mg p.o. every 6 hours as needed for anxiety/agitation    Vitamin B12 deficiency: Resolved last B12 1195 on 12/11 -Continue vitamin B12    Essential hypertension -BP stable   CKD stage IIIa -Creatinine stable, creatinine was 1.18 on 3/14   Moderate protein calorie malnutrition Estimated body mass index is 20.89 kg/m as calculated from the following: -Continue Ensure  DVT prophylaxis: Lovenox Code Status: Full code Family Communication:  None present at bedside.   Disposition Plan: Awaiting SNF placement  Consultants:  Neurology Psychiatry  Procedures:  EEG  Status is: Inpatient   Subjective: Patient seen and examined.  Sitter at the bedside.  Sleeping comfortably on the bed.  No acute events overnight.  Objective: Vitals:   08/02/22 1614 08/02/22 1620 08/02/22 2139 08/03/22 0702  BP: 106/70 114/72 117/87   Pulse: 79 80 94   Resp:  18 16   Temp: 97.9 F (36.6 C) 97.9 F (36.6 C) 97.6 F (36.4  C)   TempSrc:  Axillary    SpO2:  100% 100%   Weight:    59 kg  Height:        Intake/Output Summary (Last 24 hours) at 08/03/2022 0917 Last data filed at 08/02/2022 1303 Gross per 24 hour  Intake 20 ml  Output --  Net 20 ml    Filed Weights   08/01/22 0405 08/02/22 0530 08/03/22 0702  Weight: 58.7 kg 58.1 kg 59 kg    Examination:  General exam: Appears calm and comfortable, on room air, sitter at the bedside Respiratory system: Clear to auscultation. Respiratory effort normal. Cardiovascular system: S1 & S2 heard, RRR. No JVD, murmurs, rubs, gallops or clicks. No pedal edema. Gastrointestinal system: Abdomen is nondistended, soft and nontender. No organomegaly or masses felt. Normal bowel sounds heard. Central nervous system: Sleeping comfortably on bed. skin: No rashes, lesions or ulcers   Data Reviewed: I have personally reviewed following labs and imaging studies  CBC: Recent Labs  Lab 08/02/22 1852  WBC 5.1  HGB 14.3  HCT 44.2  MCV 89.5  PLT 275   Basic Metabolic Panel: Recent Labs  Lab 08/02/22 1852  NA 139  K 4.0  CL 105  CO2 25  GLUCOSE 82  BUN 17  CREATININE 1.19*  CALCIUM 9.0   GFR: Estimated Creatinine Clearance: 49.8 mL/min (A) (by C-G formula based on SCr of 1.19 mg/dL (H)). Liver Function Tests: No results for input(s): "AST", "ALT", "ALKPHOS", "BILITOT", "PROT", "ALBUMIN" in the last 168 hours. No results for input(s): "LIPASE", "AMYLASE" in the last 168 hours. No results for input(s): "AMMONIA" in  the last 168 hours. Coagulation Profile: No results for input(s): "INR", "PROTIME" in the last 168 hours. Cardiac Enzymes: No results for input(s): "CKTOTAL", "CKMB", "CKMBINDEX", "TROPONINI" in the last 168 hours. BNP (last 3 results) No results for input(s): "PROBNP" in the last 8760 hours. HbA1C: No results for input(s): "HGBA1C" in the last 72 hours. CBG: No results for input(s): "GLUCAP" in the last 168 hours. Lipid Profile: No  results for input(s): "CHOL", "HDL", "LDLCALC", "TRIG", "CHOLHDL", "LDLDIRECT" in the last 72 hours. Thyroid Function Tests: No results for input(s): "TSH", "T4TOTAL", "FREET4", "T3FREE", "THYROIDAB" in the last 72 hours. Anemia Panel: No results for input(s): "VITAMINB12", "FOLATE", "FERRITIN", "TIBC", "IRON", "RETICCTPCT" in the last 72 hours. Sepsis Labs: No results for input(s): "PROCALCITON", "LATICACIDVEN" in the last 168 hours.  No results found for this or any previous visit (from the past 240 hour(s)).    Radiology Studies: No results found.  Scheduled Meds:  vitamin B-12  500 mcg Oral Daily   enoxaparin (LOVENOX) injection  40 mg Subcutaneous Q24H   haloperidol  2 mg Oral BID   hydrocerin   Topical BID   mirtazapine  15 mg Oral QHS   senna-docusate  2 tablet Oral QHS   Continuous Infusions:   LOS: 220 days   Time spent: 25 minutes   Rhian Funari Estill Cotta, MD [facility]  If 7PM-7AM, please contact night-coverage [email or web address] 08/03/2022, 9:17 AM

## 2022-08-04 LAB — BASIC METABOLIC PANEL
Anion gap: 12 (ref 5–15)
BUN: 14 mg/dL (ref 6–20)
CO2: 26 mmol/L (ref 22–32)
Calcium: 9.5 mg/dL (ref 8.9–10.3)
Chloride: 104 mmol/L (ref 98–111)
Creatinine, Ser: 1.31 mg/dL — ABNORMAL HIGH (ref 0.44–1.00)
GFR, Estimated: 48 mL/min — ABNORMAL LOW (ref >60)
Glucose, Bld: 99 mg/dL (ref 70–99)
Potassium: 4.3 mmol/L (ref 3.5–5.1)
Sodium: 142 mmol/L (ref 135–145)

## 2022-08-04 NOTE — Progress Notes (Signed)
Unable to give PM meds yet because patient is still sleeping. When asked, staffs on the unit said that's normal for her and that she always sleeps like that. We continue to monitor.

## 2022-08-04 NOTE — Progress Notes (Signed)
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  Psychiatry and Neurology evaluated patient during her stay.  Patient is medically stable for discharge and is awaiting placement at a memory care facility.   Assessment & Plan:   Acute metabolic encephalopathy on chronic progressive dementia: -Seen by neurology and psychiatry -Status post MRI brain, EEG unrevealing, ANA negative -Haldol, as needed Zyprexa, hydroxyzine and lorazepam -mirtazapine   Unintentional weight loss, inadequate oral intake:  -Continue mirtazapine -Continue dysphagia 3 diet -encourage PO intake   Bipolar disorder:  -  Haldol 2 mg BID, olanzapine as needed, Atarax as needed - Ativan 0.5 mg p.o. every 6 hours as needed for anxiety/agitation    Vitamin B12 deficiency: Resolved last B12 1195 on 12/11 -repleted    Essential hypertension -BP stable   CKD stage IIIa -Creatinine stable, creatinine was 1.18 on 3/14   Moderate protein calorie malnutrition Estimated body mass index is 20.89 kg/m as calculated from the following: -Continue Ensure  DVT prophylaxis: Lovenox Code Status: Full code Family Communication:  None present at bedside.   Disposition Plan: Awaiting SNF placement  Consultants:  Neurology Psychiatry  Procedures:  EEG  Status is: Inpatient   Subjective: Sitter at bedside-- patient listening to [employer]  Objective: Vitals:   08/03/22 0702 08/03/22 1036 08/03/22 2150 08/04/22 0408  BP:  109/75 95/64 94/65   Pulse:  (!) 103 65 61  Resp:  17 18 17   Temp:  98.4 F (36.9 C) 98.3 F (36.8 C) 98.1 F (36.7 C)  TempSrc:  Oral Axillary Oral  SpO2:  100% 100% 100%  Weight: 59 kg      Height:       No intake or output data in the 24 hours ending 08/04/22 0759 Filed Weights   08/01/22 0405 08/02/22 0530 08/03/22 0702  Weight: 58.7 kg 58.1 kg 59 kg    Examination:   General: Appearance:    Well developed, well nourished female in no acute distress     Lungs:     respirations unlabored  Heart:    Normal heart rate.   MS:   All extremities are intact.   Neurologic:   Awake, alert      Data Reviewed: I have personally reviewed following labs and imaging studies  CBC: Recent Labs  Lab 08/02/22 1852 08/03/22 1717  WBC 5.1 4.3  HGB 14.3 13.3  HCT 44.2 39.2  MCV 89.5 86.9  PLT 275 269   Basic Metabolic Panel: Recent Labs  Lab 08/02/22 1852 08/03/22 1717  NA 139 140  K 4.0 4.0  CL 105 107  CO2 25 22  GLUCOSE 82 95  BUN 17 15  CREATININE 1.19* 1.07*  CALCIUM 9.0 9.0   GFR: Estimated Creatinine Clearance: 55.3 mL/min (A) (by C-G formula based on SCr of 1.07 mg/dL (H)). Liver Function Tests: No results for input(s): "AST", "ALT", "ALKPHOS", "BILITOT", "PROT", "ALBUMIN" in the last 168 hours. No results for input(s): "LIPASE", "AMYLASE" in the last 168 hours. No results for input(s): "AMMONIA" in the last 168 hours. Coagulation Profile: No results for input(s): "INR", "PROTIME" in the last 168 hours. Cardiac Enzymes: No results for input(s): "CKTOTAL", "CKMB", "CKMBINDEX", "TROPONINI" in the last 168  hours. BNP (last 3 results) No results for input(s): "PROBNP" in the last 8760 hours. HbA1C: No results for input(s): "HGBA1C" in the last 72 hours. CBG: No results for input(s): "GLUCAP" in the last 168 hours. Lipid Profile: No results for input(s): "CHOL", "HDL", "LDLCALC", "TRIG", "CHOLHDL", "LDLDIRECT" in the last 72 hours. Thyroid Function Tests: No results for input(s): "TSH", "T4TOTAL", "FREET4", "T3FREE", "THYROIDAB" in the last 72 hours. Anemia Panel: No results for input(s): "VITAMINB12", "FOLATE", "FERRITIN", "TIBC", "IRON",  "RETICCTPCT" in the last 72 hours. Sepsis Labs: No results for input(s): "PROCALCITON", "LATICACIDVEN" in the last 168 hours.  No results found for this or any previous visit (from the past 240 hour(s)).    Radiology Studies: No results found.  Scheduled Meds:  vitamin B-12  500 mcg Oral Daily   enoxaparin (LOVENOX) injection  40 mg Subcutaneous Q24H   haloperidol  2 mg Oral BID   hydrocerin   Topical BID   mirtazapine  15 mg Oral QHS   senna-docusate  2 tablet Oral QHS   Continuous Infusions:   LOS: 221 days   Time spent: 25 minutes   Joseph Art, DO [facility]  If 7PM-7AM, please contact night-coverage [email or web address] 08/04/2022, 7:59 AM

## 2022-08-05 NOTE — Progress Notes (Signed)
PROGRESS NOTE Natalie Brown  ZOX:[id number] DOB: 1965-09-07 DOA: 12/24/2021 PCP: Lavinia Sharps, NP  Brief Narrative/Hospital Course: Ms. 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  Psychiatry and Neurology evaluated patient during her stay.  Psychiatry patient does not meet criteria for inpatient behavioral health and advised memory care unit. Patient is medically stable for discharge and is awaiting placement at a memory care facility.  Significant events: 09/07/2021: admitted to behavioral health treated with Zyprexa Invega and Ativan challenge for catatonia. 12/24/2021: sent to ER from [facility] for evaluation for tachycardia 9/04-9/5, 9/5>9/9, LTM EEG  no seizure. TSH stable CTA chest no PE no pneumonia RPR nonreactive bilateral LE Doppler negative for DVT Echo with normal EF  9/23 febrile secondary to Covid 19 + no pneumonia. 9/25 repeat CT brain unremarkable. 06/24/2022  psychiatry consulted for increasing agitation    Subjective: Patient seen and examined this morning Nonverbal not responding sleeping on the bed breathing spontaneously BP has been fluctuating specially in the morning while on the bed hypotensive asymptomatic improves spontaneously. Labs w/ creat 1.3 on 4/14    Assessment and Plan: Principal Problem:   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) Active Problems:   Delirium due to multiple etiologies, acute, hyperactive   Bipolar affective disorder, current episode manic with psychotic symptoms   Sinus tachycardia   Tobacco abuse   Altered mental state   B12 deficiency   COVID-19 virus infection   Drooling   Essential hypertension    Acute metabolic encephalopathy on chronic progressive dementia:Seen by neurology and psychiatry-underwent extensive workup including MRI brain, EEG  unrevealing, ANA negative. Cont on Haldol, as needed Zyprexa, hydroxyzine and lorazepam, mirtazapine Continue to mobilize with PT OT.   Unintentional weight loss, w/ inadequate oral intake:  cont Remeron dysphagia 3 diet and encourage oral intake   Bipolar disorder: Cont on Haldol 2 mg BID, olanzapine as needed, Atarax as needed.Ativan 0.5 mg p.o. every 6 hours as needed for anxiety/agitation.    Vitamin B12 deficiency: Resolved last B12 1195 on 12/11.    Essential hypertension:BP stable   CKD stage IIIa:Creatinine slightly uptrending 1.3, if remains elevated will restart IV fluids, encourage oral hydration  Recent Labs    06/03/22 1718 06/06/22 0943 06/16/22 0459 06/24/22 0836 06/26/22 0816 06/28/22 0956 07/04/22 0801 08/02/22 1852 08/03/22 1717 08/04/22 1553  BUN 22* CREATININE 1.14* 1.12* 1.17* 1.29* 1.23* 1.13* 1.18* 1.19* 1.07* 1.31*    Nutrition Problem: Altered GI function Etiology: constipation Signs/Symptoms: other (comment) (last BM 10 days ago) Interventions: Other (Comment) (scheduled bowel regimen per MD)    DVT prophylaxis: enoxaparin (LOVENOX) injection 40 mg Start: 12/26/21 1200 Code Status:   Code Status: Full Code Family Communication: plan of care discussed with patient at bedside. Patient status is: Inpatient because of unsafe disposition Level of care: Med-Surg   Dispo: The patient is from: Behavioral health            Anticipated disposition: Awaiting Memory care Objective: Vitals last 24 hrs: Vitals:   08/04/22 1029 08/04/22 1230 08/04/22 2031 08/05/22 0518  BP: 114/75  (!) 120/90 101/64  Pulse: (!) 111 (!) 102 (!) 110 72  Resp: Temp: 97.7 F (36.5 C)  98.6 F (37 C) 97.7 F (36.5 C)  TempSrc:  Oral  Oral Axillary  SpO2: 100%  90% 100%  Weight:      Height:       Weight change:   Physical Examination: General exam: alert awake, nonverbal, not responding  HEENT:Oral mucosa moist, Ear/Nose WNL  grossly Respiratory system: bilaterally clear BS, no use of accessory muscle Cardiovascular system: S1 & S2 +, No JVD. Gastrointestinal system: Abdomen soft,NT,ND, BS+ Nervous System:Alert, awake, moving extremities. Extremities: LE edema neg,distal peripheral pulses palpable.  Skin: No rashes,no icterus. MSK: Normal muscle bulk,tone, power  Medications reviewed:  Scheduled Meds:  vitamin B-12  500 mcg Oral Daily   enoxaparin (LOVENOX) injection  40 mg Subcutaneous Q24H   haloperidol  2 mg Oral BID   hydrocerin   Topical BID   mirtazapine  15 mg Oral QHS   senna-docusate  2 tablet Oral QHS   Continuous Infusions:    Diet Order             DIET DYS 3 Room service appropriate? Yes; Fluid consistency: Thin  Diet effective now                    Nutrition Problem: Altered GI function Etiology: constipation Signs/Symptoms: other (comment) (last BM 10 days ago) Interventions: Other (Comment) (scheduled bowel regimen per MD)   Intake/Output Summary (Last 24 hours) at 08/05/2022 1244 Last data filed at 08/05/2022 0856 Gross per 24 hour  Intake 240 ml  Output --  Net 240 ml   Net IO Since Admission: 54,845.29 mL [08/05/22 1244]  Wt Readings from Last 3 Encounters:  08/03/22 59 kg  12/24/21 72 kg  09/05/21 72.6 kg     Unresulted Labs (From admission, onward)    None     Data Reviewed: I have personally reviewed following labs and imaging studies CBC: Recent Labs  Lab 08/02/22 1852 08/03/22 1717  WBC 5.1 4.3  HGB 14.3 13.3  HCT 44.2 39.2  MCV 89.5 86.9  PLT 275 269   Basic Metabolic Panel: Recent Labs  Lab 08/02/22 1852 08/03/22 1717 08/04/22 1553  NA 139 140 142  K 4.0 4.0 4.3  CL 105 107 104  CO2 25 22 26   GLUCOSE 82 95 99  BUN 17 15 14   CREATININE 1.19* 1.07* 1.31*  CALCIUM 9.0 9.0 9.5   No results found for this or any previous visit (from the past 240 hour(s)).  Antimicrobials: Anti-infectives (From admission, onward)    None       Culture/Microbiology    Component Value Date/Time   SDES BLOOD LEFT HAND 01/12/2022 0828   SDES BLOOD RIGHT HAND 01/12/2022 0828   SPECREQUEST  01/12/2022 0828    BOTTLES DRAWN AEROBIC ONLY Blood Culture results may not be optimal due to an inadequate volume of blood received in culture bottles   SPECREQUEST  01/12/2022 0828    BOTTLES DRAWN AEROBIC ONLY Blood Culture results may not be optimal due to an inadequate volume of blood received in culture bottles   CULT  01/12/2022 0828    NO GROWTH 5 DAYS Performed at [facility], [address], [place], [place] [address]    CULT  01/12/2022 [phone]    NO GROWTH 5 DAYS Performed at [facility], [address], [place], [place] [address]    REPTSTATUS 01/17/2022 FINAL 01/12/2022 9935   REPTSTATUS 01/17/2022 FINAL 01/12/2022 7017  Radiology Studies: No results found.   LOS: 222 days   Lanae Boast, MD [facility]  08/05/2022, 12:44  PM

## 2022-08-05 NOTE — Plan of Care (Signed)
Pt remains awake and impulsive throughout shift. Remains non-verbal with staff. VSS throughout shift. All meds given on time as ordered.  POC maintained, will continue to monitor.   Problem: Education: Goal: Knowledge of General Education information will improve Description: Including pain rating scale, medication(s)/side effects and non-pharmacologic comfort measures Outcome: Progressing   Problem: Health Behavior/Discharge Planning: Goal: Ability to manage health-related needs will improve Outcome: Progressing   Problem: Clinical Measurements: Goal: Ability to maintain clinical measurements within normal limits will improve Outcome: Progressing Goal: Will remain free from infection Outcome: Progressing Goal: Diagnostic test results will improve Outcome: Progressing Goal: Respiratory complications will improve Outcome: Progressing Goal: Cardiovascular complication will be avoided Outcome: Progressing   Problem: Activity: Goal: Risk for activity intolerance will decrease Outcome: Progressing   Problem: Nutrition: Goal: Adequate nutrition will be maintained Outcome: Progressing   Problem: Coping: Goal: Level of anxiety will decrease Outcome: Progressing   Problem: Elimination: Goal: Will not experience complications related to bowel motility Outcome: Progressing Goal: Will not experience complications related to urinary retention Outcome: Progressing   Problem: Pain Managment: Goal: General experience of comfort will improve Outcome: Progressing   Problem: Safety: Goal: Ability to remain free from injury will improve Outcome: Progressing   Problem: Skin Integrity: Goal: Risk for impaired skin integrity will decrease Outcome: Progressing

## 2022-08-06 NOTE — Plan of Care (Signed)
Pt remains awake and impulsive throughout shift. Remains non-verbal with staff. VSS throughout shift. All meds given on time as ordered.  POC maintained, will continue to monitor.   Problem: Education: Goal: Knowledge of General Education information will improve Description: Including pain rating scale, medication(s)/side effects and non-pharmacologic comfort measures Outcome: Progressing   Problem: Health Behavior/Discharge Planning: Goal: Ability to manage health-related needs will improve Outcome: Progressing   Problem: Clinical Measurements: Goal: Ability to maintain clinical measurements within normal limits will improve Outcome: Progressing Goal: Will remain free from infection Outcome: Progressing Goal: Diagnostic test results will improve Outcome: Progressing Goal: Respiratory complications will improve Outcome: Progressing Goal: Cardiovascular complication will be avoided Outcome: Progressing   Problem: Activity: Goal: Risk for activity intolerance will decrease Outcome: Progressing   Problem: Nutrition: Goal: Adequate nutrition will be maintained Outcome: Progressing   Problem: Coping: Goal: Level of anxiety will decrease Outcome: Progressing   Problem: Elimination: Goal: Will not experience complications related to bowel motility Outcome: Progressing Goal: Will not experience complications related to urinary retention Outcome: Progressing   Problem: Pain Managment: Goal: General experience of comfort will improve Outcome: Progressing   Problem: Safety: Goal: Ability to remain free from injury will improve Outcome: Progressing   Problem: Skin Integrity: Goal: Risk for impaired skin integrity will decrease Outcome: Progressing   

## 2022-08-06 NOTE — Progress Notes (Signed)
PROGRESS NOTE ELANAH OSMANOVIC  ZOX:[id number] DOB: January 02, 1966 DOA: 12/24/2021 PCP: Lavinia Sharps, NP  Brief Narrative/Hospital Course: Ms. 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  Psychiatry and Neurology evaluated patient during her stay.  Psychiatry patient does not meet criteria for inpatient behavioral health and advised memory care unit. Patient is medically stable for discharge and is awaiting placement at a memory care facility.  Significant events: 09/07/2021: admitted to behavioral health treated with Zyprexa Invega and Ativan challenge for catatonia. 12/24/2021: sent to ER from [facility] for evaluation for tachycardia 9/04-9/5, 9/5>9/9, LTM EEG  no seizure. TSH stable CTA chest no PE no pneumonia RPR nonreactive bilateral LE Doppler negative for DVT Echo with normal EF  9/23 febrile secondary to Covid 19 + no pneumonia. 9/25 repeat CT brain unremarkable. 06/24/2022  psychiatry consulted for increasing agitation    Subjective: Seen and examined.  Was sleeping comfortably sitter at the bedside,  She did wake up on tactile stimulation eyes open but does not follow commands or interact, appears calm    Assessment and Plan: Principal Problem:   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) Active Problems:   Delirium due to multiple etiologies, acute, hyperactive   Bipolar affective disorder, current episode manic with psychotic symptoms   Sinus tachycardia   Tobacco abuse   Altered mental state   B12 deficiency   COVID-19 virus infection   Drooling   Essential hypertension    Acute metabolic encephalopathy on chronic progressive dementia: Overall unchanged mental status with memory loss, nonverbal not interactive, is calm and not agitated.  She was seen by neurology and psychiatry-underwent extensive  workup including MRI brain, EEG unrevealing, ANA negative. Cont on Haldol, as needed Zyprexa, hydroxyzine and lorazepam, mirtazapine Continue to mobilize with PT OT.   Unintentional weight loss, w/ inadequate oral intake:  cont Remeron dysphagia 3 diet and encourage oral intake   Bipolar disorder: Cont on Haldol 2 mg BID, olanzapine as needed, Atarax as needed.Ativan 0.5 mg p.o. every 6 hours as needed for anxiety/agitation.    Vitamin B12 deficiency: Resolved last B12 1195 on 12/11.    Essential hypertension:BP stable   CKD stage IIIa:Creatinine was slightly uptrending 1.3> recheck renal function encourage oral hydration, if remains elevated will restart IV fluids. Recent Labs    06/03/22 1718 06/06/22 0943 06/16/22 0459 06/24/22 0836 06/26/22 0816 06/28/22 0956 07/04/22 0801 08/02/22 1852 08/03/22 1717 08/04/22 1553  BUN 22* CREATININE 1.14* 1.12* 1.17* 1.29* 1.23* 1.13* 1.18* 1.19* 1.07* 1.31*    Nutrition Problem: Altered GI function Etiology: constipation Signs/Symptoms: other (comment) (last BM 10 days ago) Interventions: Other (Comment) (scheduled bowel regimen per MD)    DVT prophylaxis: enoxaparin (LOVENOX) injection 40 mg Start: 12/26/21 1200 Code Status:   Code Status: Full Code Family Communication: plan of care discussed with patient at bedside. Patient status is: Inpatient because of unsafe disposition Level of care: Med-Surg   Dispo: The patient is from: Behavioral health            Anticipated disposition: Awaiting Memory care Objective: Vitals last 24 hrs: Vitals:   08/05/22 1754 08/05/22 2201 08/06/22 0823 08/06/22 0930  BP: 102/79 (!) 142/126 115/75 115/75  Pulse: (!) 56 (!) 102 (!) 112 (!) 112  Resp: 18  Temp: (!) 97.5 F (36.4 C) 98 F (36.7 C) 99.7 F (37.6 C) 99.7 F (37.6 C)  TempSrc:  Oral    SpO2: 100% 94% 100%   Weight:      Height:       Weight change:   Physical Examination: General exam:  Sleeping but woke up with tactile stimulation alert, not following commands HEENT:Oral mucosa moist, Ear/Nose WNL grossly, dentition normal. Respiratory system: bilaterally clear BS, no use of accessory muscle Cardiovascular system: S1 & S2 +, regular rate. Gastrointestinal system: Abdomen soft,NT,ND,BS+ Nervous System:Alert, awake, moving all extremities and grossly nonfocal Extremities: LE ankle edema neg, lower extremities warm Skin: No rashes,no icterus. MSK: Normal muscle bulk,tone, power   Medications reviewed:  Scheduled Meds:  vitamin B-12  500 mcg Oral Daily   enoxaparin (LOVENOX) injection  40 mg Subcutaneous Q24H   haloperidol  2 mg Oral BID   hydrocerin   Topical BID   mirtazapine  15 mg Oral QHS   senna-docusate  2 tablet Oral QHS   Continuous Infusions:    Diet Order             DIET DYS 3 Room service appropriate? Yes; Fluid consistency: Thin  Diet effective now                    Nutrition Problem: Altered GI function Etiology: constipation Signs/Symptoms: other (comment) (last BM 10 days ago) Interventions: Other (Comment) (scheduled bowel regimen per MD)   Intake/Output Summary (Last 24 hours) at 08/06/2022 1025 Last data filed at 08/06/2022 0811 Gross per 24 hour  Intake 340 ml  Output --  Net 340 ml   Net IO Since Admission: 55,185.29 mL [08/06/22 1025]  Wt Readings from Last 3 Encounters:  08/03/22 59 kg  12/24/21 72 kg  09/05/21 72.6 kg     Unresulted Labs (From admission, onward)     Start     Ordered   08/06/22 0500  Basic metabolic panel  Tomorrow morning,   R       Question:  Specimen collection method  Answer:  Lab=Lab collect   08/05/22 1244   08/06/22 0500  CBC  Tomorrow morning,   R       Question:  Specimen collection method  Answer:  Lab=Lab collect   08/05/22 1244          Data Reviewed: I have personally reviewed following labs and imaging studies CBC: Recent Labs  Lab 08/02/22 1852 08/03/22 1717  WBC 5.1 4.3   HGB 14.3 13.3  HCT 44.2 39.2  MCV 89.5 86.9  PLT 275 269   Basic Metabolic Panel: Recent Labs  Lab 08/02/22 1852 08/03/22 1717 08/04/22 1553  NA 139 140 142  K 4.0 4.0 4.3  CL 105 107 104  CO2 25 22 26   GLUCOSE 82 95 99  BUN 17 15 14   CREATININE 1.19* 1.07* 1.31*  CALCIUM 9.0 9.0 9.5   No results found for this or any previous visit (from the past 240 hour(s)).  Antimicrobials: Anti-infectives (From admission, onward)    None      Culture/Microbiology    Component Value Date/Time   SDES BLOOD LEFT HAND 01/12/2022 0828   SDES BLOOD RIGHT HAND 01/12/2022 0828   SPECREQUEST  01/12/2022 0828    BOTTLES DRAWN AEROBIC ONLY Blood Culture results may not be optimal due to an inadequate volume of blood received in culture bottles   SPECREQUEST  01/12/2022 0828    BOTTLES DRAWN AEROBIC  ONLY Blood Culture results may not be optimal due to an inadequate volume of blood received in culture bottles   CULT  01/12/2022 0828    NO GROWTH 5 DAYS Performed at [facility], [address], [place], [place] [address]    CULT  01/12/2022 [phone]    NO GROWTH 5 DAYS Performed at [facility], [address], [place], [place] [address]    REPTSTATUS 01/17/2022 FINAL 01/12/2022 5784   REPTSTATUS 01/17/2022 FINAL 01/12/2022 6962  Radiology Studies: No results found.   LOS: 223 days   Lanae Boast, MD [facility]  08/06/2022, 10:25 AM

## 2022-08-07 LAB — BASIC METABOLIC PANEL
Anion gap: 13 (ref 5–15)
BUN: 15 mg/dL (ref 6–20)
CO2: 21 mmol/L — ABNORMAL LOW (ref 22–32)
Calcium: 9.2 mg/dL (ref 8.9–10.3)
Chloride: 105 mmol/L (ref 98–111)
Creatinine, Ser: 1.14 mg/dL — ABNORMAL HIGH (ref 0.44–1.00)
GFR, Estimated: 57 mL/min — ABNORMAL LOW (ref >60)
Glucose, Bld: 89 mg/dL (ref 70–99)
Potassium: 5 mmol/L (ref 3.5–5.1)
Sodium: 139 mmol/L (ref 135–145)

## 2022-08-07 NOTE — Plan of Care (Signed)
Pt remains awake and impulsive throughout shift. Remains non-verbal with staff. VSS throughout shift. Pt refused all meds.  POC maintained, will continue to monitor.  Problem: Education: Goal: Knowledge of General Education information will improve Description: Including pain rating scale, medication(s)/side effects and non-pharmacologic comfort measures Outcome: Progressing   Problem: Health Behavior/Discharge Planning: Goal: Ability to manage health-related needs will improve Outcome: Progressing   Problem: Clinical Measurements: Goal: Ability to maintain clinical measurements within normal limits will improve Outcome: Progressing Goal: Will remain free from infection Outcome: Progressing Goal: Diagnostic test results will improve Outcome: Progressing Goal: Respiratory complications will improve Outcome: Progressing Goal: Cardiovascular complication will be avoided Outcome: Progressing   Problem: Activity: Goal: Risk for activity intolerance will decrease Outcome: Progressing   Problem: Nutrition: Goal: Adequate nutrition will be maintained Outcome: Progressing   Problem: Coping: Goal: Level of anxiety will decrease Outcome: Progressing   Problem: Elimination: Goal: Will not experience complications related to bowel motility Outcome: Progressing Goal: Will not experience complications related to urinary retention Outcome: Progressing   Problem: Pain Managment: Goal: General experience of comfort will improve Outcome: Progressing   Problem: Safety: Goal: Ability to remain free from injury will improve Outcome: Progressing   Problem: Skin Integrity: Goal: Risk for impaired skin integrity will decrease Outcome: Progressing

## 2022-08-07 NOTE — Progress Notes (Signed)
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  Psychiatry and Neurology evaluated patient during her stay.  Psychiatry patient does not meet criteria for inpatient behavioral health and advised memory care unit. Patient is medically stable for discharge and is awaiting placement at a memory care facility.  Significant events: 09/07/2021: admitted to behavioral health treated with Zyprexa Invega and Ativan challenge for catatonia. 12/24/2021: sent to ER from [facility] for evaluation for tachycardia 9/04-9/5, 9/5>9/9, LTM EEG  no seizure. TSH stable CTA chest no PE no pneumonia RPR nonreactive bilateral LE Doppler negative for DVT Echo with normal EF  9/23 febrile secondary to Covid 19 + no pneumonia. 9/25 repeat CT brain unremarkable. 06/24/2022  psychiatry consulted for increasing agitation    Subjective: Seen and examined this morning  Sitter at the bedside -reports he ate 75% of her meal Currently she is coloring the book  She ambulated in the hallway yesterday BP remained stable 90s to 115 Difficult to get labs despite multiple trials  Assessment and Plan: Principal Problem:   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) Active Problems:   Delirium due to multiple etiologies, acute, hyperactive   Bipolar affective disorder, current episode manic with psychotic symptoms   Sinus tachycardia   Tobacco abuse   Altered mental state   B12 deficiency   COVID-19 virus infection   Drooling   Essential hypertension    Acute metabolic encephalopathy on chronic progressive dementia: Overall unchanged mental status with memory loss, nonverbal not interactive, is calm and not agitated.   She was seen by neurology and psychiatry-underwent extensive workup including MRI brain, EEG unrevealing, ANA negative. Cont on Haldol, as needed Zyprexa, hydroxyzine and lorazepam, mirtazapine. Continue to mobilize with PT OT.   Unintentional weight loss, w/ inadequate oral intake:  cont Remeron dysphagia 3 diet and encourage oral intake   Bipolar disorder: Cont on Haldol 2 mg BID, olanzapine as needed, Atarax as needed.Ativan 0.5 mg p.o. every 6 hours as needed for anxiety/agitation.    Vitamin B12 deficiency: Resolved last B12 1195 on 12/11.    Essential hypertension:BP stable-at times soft.   CKD stage IIIa:Creatinine was slightly uptrending 1.3> difficult to get labs despite multiple attempts - doing well this am see below continue to encourage oral hydration and oral intake  Recent Labs    06/06/22 0943 06/16/22 0459 06/24/22 0836 06/26/22 0816 06/28/22 0956 07/04/22 0801 08/02/22 1852 08/03/22 1717 08/04/22 1553 08/07/22 0940  BUN CREATININE 1.12* 1.17* 1.29* 1.23* 1.13* 1.18* 1.19* 1.07* 1.31* 1.14*     Nutrition Problem: Altered GI function Etiology: constipation Signs/Symptoms: other (comment) (last BM 10 days ago) Interventions: Other (Comment) (scheduled bowel regimen per MD)    DVT prophylaxis: enoxaparin (LOVENOX) injection 40 mg Start: 12/26/21 1200 Code Status:   Code Status: Full Code Family Communication: plan of care discussed with patient at bedside. Patient status is: Inpatient because of unsafe disposition Level of care: Med-Surg   Dispo: The patient is from: Behavioral health            Anticipated disposition: Awaiting Memory care Objective: Vitals last 24 hrs: Vitals:   08/05/22 2201 08/06/22 0823 08/06/22 0930 08/06/22 2043  BP: (!) 142/126 115/75 115/75 92/62  Pulse: (!) 102 (!) 112 (!) 112 83  Resp: Temp: 98 F (36.7 C) 99.7 F (37.6 C) 99.7 F (37.6 C) 97.9 F (36.6 C)  TempSrc: Oral   Oral   SpO2: 94% 100%  100%  Weight:      Height:       Weight change:   Physical Examination: General exam: AA, weak,older appearing HEENT:Oral mucosa moist, Ear/Nose WNL grossly, dentition normal. Respiratory system: bilaterally clear, no use of accessory muscle Cardiovascular system: S1 & S2 +, regular rate. Gastrointestinal system: Abdomen soft,NT,ND,BS+ Nervous System:Alert, awake, moving extremities and grossly nonfocal Extremities: LE ankle edema neg, lower extremities warm Skin: No rashes,no icterus. MSK: Normal muscle bulk,tone, power   Medications reviewed:  Scheduled Meds:  vitamin B-12  500 mcg Oral Daily   enoxaparin (LOVENOX) injection  40 mg Subcutaneous Q24H   haloperidol  2 mg Oral BID   hydrocerin   Topical BID   mirtazapine  15 mg Oral QHS   senna-docusate  2 tablet Oral QHS   Continuous Infusions:    Diet Order             DIET DYS 3 Room service appropriate? Yes; Fluid consistency: Thin  Diet effective now                    Nutrition Problem: Altered GI function Etiology: constipation Signs/Symptoms: other (comment) (last BM 10 days ago) Interventions: Other (Comment) (scheduled bowel regimen per MD)   Intake/Output Summary (Last 24 hours) at 08/07/2022 1113 Last data filed at 08/07/2022 0931 Gross per 24 hour  Intake 340 ml  Output --  Net 340 ml    Net IO Since Admission: 55,525.29 mL [08/07/22 1113]  Wt Readings from Last 3 Encounters:  08/03/22 59 kg  12/24/21 72 kg  09/05/21 72.6 kg     Unresulted Labs (From admission, onward)    None     Data Reviewed: I have personally reviewed following labs and imaging studies CBC: Recent Labs  Lab 08/02/22 1852 08/03/22 1717  WBC 5.1 4.3  HGB 14.3 13.3  HCT 44.2 39.2  MCV 89.5 86.9  PLT 275 269    Basic Metabolic Panel: Recent Labs  Lab 08/02/22 1852 08/03/22 1717 08/04/22 1553 08/07/22 0940  NA 139 140 142 139  K 4.0 4.0 4.3 5.0  CL 105 107 104 105  CO2 21*   GLUCOSE 82 95 99 89  BUN CREATININE 1.19* 1.07* 1.31* 1.14*  CALCIUM 9.0 9.0 9.5 9.2    No results found for this or any previous visit (from the past 240 hour(s)).  Antimicrobials: Anti-infectives (From admission, onward)    None      Culture/Microbiology    Component Value Date/Time   SDES BLOOD LEFT HAND 01/12/2022 0828   SDES BLOOD RIGHT HAND 01/12/2022 0828   SPECREQUEST  01/12/2022 0828    BOTTLES DRAWN AEROBIC ONLY Blood Culture results may not be optimal due to an inadequate volume of blood received in culture bottles   SPECREQUEST  01/12/2022 0828    BOTTLES DRAWN AEROBIC ONLY Blood Culture results may not be optimal due to an inadequate volume of blood received in culture bottles   CULT  01/12/2022 0828    NO GROWTH 5 DAYS Performed at [facility], [address] [place], [place] [address]    CULT  01/12/2022 [phone]  NO GROWTH 5 DAYS Performed at [facility], [address], [place], [place] [address]    REPTSTATUS 01/17/2022 FINAL 01/12/2022 6045   REPTSTATUS 01/17/2022 FINAL 01/12/2022 4098  Radiology Studies: No results found.   LOS: 224 days   Lanae Boast, MD [facility]  08/07/2022, 11:13 AM

## 2022-08-08 NOTE — Progress Notes (Signed)
Had a very quite night. Slept most of the shift. Could not given scheduled meds as patient was fast/deep in sleep. Appeared irritable/confused intermittently when awake.  Denies pain. Safety maintained at all times.

## 2022-08-08 NOTE — Progress Notes (Signed)
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  Psychiatry and Neurology evaluated patient during her stay.  Psychiatry patient does not meet criteria for inpatient behavioral health and advised memory care unit. Patient is medically stable for discharge and is awaiting placement at a memory care facility.  Significant events: 09/07/2021: admitted to behavioral health treated with Zyprexa Invega and Ativan challenge for catatonia. 12/24/2021: sent to ER from [facility] for evaluation for tachycardia 9/04-9/5, 9/5>9/9, LTM EEG  no seizure. TSH stable CTA chest no PE no pneumonia RPR nonreactive bilateral LE Doppler negative for DVT Echo with normal EF  9/23 febrile secondary to Covid 19 + no pneumonia. 9/25 repeat CT brain unremarkable. 06/24/2022  psychiatry consulted for increasing agitation    Subjective: Seen and examined this morning sitter at the bedside She took a shower and currently resting Overnight BP stable in 150s  Nursing reports patient did not take her Haldol Remeron during night as she was in sleep    Assessment and Plan: Principal Problem:   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) Active Problems:   Delirium due to multiple etiologies, acute, hyperactive   Bipolar affective disorder, current episode manic with psychotic symptoms   Sinus tachycardia   Tobacco abuse   Altered mental state   B12 deficiency   COVID-19 virus infection   Drooling   Essential hypertension    Acute metabolic encephalopathy on chronic progressive dementia: Mental status overall unchanged, remains confused, nonverbal not interactive, is calm and not agitated, remains confused.She  was seen by neurology and psychiatry-underwent extensive workup including MRI brain, EEG unrevealing, ANA negative. Cont on Haldol, as needed Zyprexa, hydroxyzine and lorazepam, mirtazapine. Continue to mobilize with PT OT.   Unintentional weight loss, w/ inadequate oral intake:  cont Remeron dysphagia 3 diet and encourage oral intake   Bipolar disorder: Cont on Haldol 2 mg BID, olanzapine as needed, Atarax as needed.Ativan 0.5 mg p.o. every 6 hours as needed for anxiety/agitation.    Vitamin B12 deficiency: Resolved last B12 1195 on 12/11.    Essential hypertension:BP stable-at times soft.   CKD stage IIIa:Creatinine trending up to 1.3 on 4/14 and subsequently improved, continue to encourage oral hydration, she eats well.   Recent Labs    06/06/22 0943 06/16/22 0459 06/24/22 0836 06/26/22 0816 06/28/22 0956 07/04/22 0801 08/02/22 1852 08/03/22 1717 08/04/22 1553 08/07/22 0940  BUN CREATININE 1.12* 1.17* 1.29* 1.23* 1.13* 1.18* 1.19* 1.07* 1.31* 1.14*    Nutrition Problem: Altered GI function Etiology: constipation Signs/Symptoms: other (comment) (last BM 10 days ago) Interventions: Other (Comment) (scheduled bowel regimen per MD)    DVT prophylaxis: enoxaparin (LOVENOX) injection 40 mg Start: 12/26/21 1200 Code Status:   Code Status: Full Code Family Communication: plan of care discussed with patient at bedside. Patient status is: Inpatient because of unsafe disposition Level of care: Med-Surg   Dispo: The patient is from: Behavioral health            Anticipated disposition: Awaiting Memory care Objective: Vitals last 24 hrs: Vitals:   08/06/22 0823 08/06/22 0930 08/06/22 2043 08/08/22 0626  BP: 115/75 115/75 92/62 (!) 152/91  Pulse: (!) 112 (!) 112  83 90  Resp: Temp: 99.7 F (37.6 C) 99.7 F (37.6 C) 97.9 F (36.6 C) (!) 97 F (36.1 C)  TempSrc:   Oral Axillary  SpO2: 100%  100% 100%  Weight:    57.9 kg  Height:        Weight change:   Physical Examination: General exam: AA, nonverbal non verbal  weak,older appearing HEENT:Oral mucosa moist, Ear/Nose WNL grossly, dentition normal. Respiratory system: bilaterally clearBS, no use of accessory muscle Cardiovascular system: S1 & S2 +, regular rate,. Gastrointestinal system: Abdomen soft, NT,ND,BS+ Nervous System:Alert, awake, moving extremities Extremities: LE ankle edema neg, lower extremities warm Skin: No rashes,no icterus. MSK: normal muscle bulk,tone, power   Medications reviewed:  Scheduled Meds:  vitamin B-12  500 mcg Oral Daily   enoxaparin (LOVENOX) injection  40 mg Subcutaneous Q24H   haloperidol  2 mg Oral BID   hydrocerin   Topical BID   mirtazapine  15 mg Oral QHS   senna-docusate  2 tablet Oral QHS   Continuous Infusions:    Diet Order             DIET DYS 3 Room service appropriate? Yes; Fluid consistency: Thin  Diet effective now                    Nutrition Problem: Altered GI function Etiology: constipation Signs/Symptoms: other (comment) (last BM 10 days ago) Interventions: Other (Comment) (scheduled bowel regimen per MD)   Intake/Output Summary (Last 24 hours) at 08/08/2022 1049 Last data filed at 08/07/2022 1302 Gross per 24 hour  Intake 5 ml  Output --  Net 5 ml   Net IO Since Admission: 55,530.29 mL [08/08/22 1049]  Wt Readings from Last 3 Encounters:  08/08/22 57.9 kg  12/24/21 72 kg  09/05/21 72.6 kg     Unresulted Labs (From admission, onward)    None     Data Reviewed: I have personally reviewed following labs and imaging studies CBC: Recent Labs  Lab 08/02/22 1852 08/03/22 1717  WBC 5.1 4.3  HGB 14.3 13.3  HCT 44.2 39.2  MCV 89.5 86.9  PLT 275 269   Basic Metabolic Panel: Recent Labs  Lab 08/02/22 1852 08/03/22 1717 08/04/22 1553 08/07/22 0940  NA 139 140 142 139  K 4.0 4.0 4.3 5.0  CL 105 107 104 105  CO2 21*  GLUCOSE 82 95 99 89  BUN CREATININE  1.19* 1.07* 1.31* 1.14*  CALCIUM 9.0 9.0 9.5 9.2   No results found for this or any previous visit (from the past 240 hour(s)).  Antimicrobials: Anti-infectives (From admission, onward)    None      Culture/Microbiology    Component Value Date/Time   SDES BLOOD LEFT HAND 01/12/2022 0828   SDES BLOOD RIGHT HAND 01/12/2022 0828   SPECREQUEST  01/12/2022 0828    BOTTLES DRAWN AEROBIC ONLY Blood Culture results may not be optimal due to an inadequate volume of blood received in culture bottles   SPECREQUEST  01/12/2022 0828    BOTTLES DRAWN AEROBIC ONLY Blood Culture results may not be optimal due to an inadequate volume of blood received in culture bottles   CULT  01/12/2022 0828    NO GROWTH 5 DAYS Performed at [facility], [address], [place], [place] [address]    CULT  01/12/2022 [phone]    NO GROWTH 5 DAYS Performed at [facility],  [address], [place], [place] [address]    REPTSTATUS 01/17/2022 FINAL 01/12/2022 2130   REPTSTATUS 01/17/2022 FINAL 01/12/2022 8657  Radiology Studies: No results found.   LOS: 225 days   Lanae Boast, MD [facility]  08/08/2022, 10:49 AM

## 2022-08-09 NOTE — Progress Notes (Signed)
CSW spoke with Natalie Brown at [facility] who is agreeable to review referral - CSW faxed clinicals for review.  Edwin Dada, MSW, LCSW Transitions of Care  Clinical Social Worker II [phone]

## 2022-08-09 NOTE — Progress Notes (Signed)
PROGRESS NOTE Natalie Brown  ZOX:[id number] DOB: [birth year] DOA: 12/24/2021 PCP: Lavinia Sharps, NP  Brief Narrative/Hospital Course: 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  Psychiatry and Neurology evaluated patient during her stay.  Psychiatry patient does not meet criteria for inpatient behavioral health and advised memory care unit. Patient is medically stable for discharge and is awaiting placement at a memory care facility.  Significant events: 09/07/2021: admitted to behavioral health treated with Zyprexa Invega and Ativan challenge for catatonia. 12/24/2021: sent to ER from [facility] for evaluation for tachycardia 9/04-9/5, 9/5>9/9, LTM EEG  no seizure. TSH stable CTA chest no PE no pneumonia RPR nonreactive bilateral LE Doppler negative for DVT Echo with normal EF  9/23 febrile secondary to Covid 19 + no pneumonia. 9/25 repeat CT brain unremarkable. 06/24/2022  psychiatry consulted for increasing agitation    Subjective: Patient was seen and examined this morning She was sleeping but did wake up on tactile stimulation Nonverbal no complaint bedside sitter in place Overnight patient has been afebrile, BP has been stable.  No acute events noted.   No change in overall plan of care  Assessment and Plan: Principal Problem:   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) Active Problems:   Delirium due to multiple etiologies, acute, hyperactive   Bipolar affective disorder, current episode manic with psychotic symptoms   Sinus tachycardia   Tobacco abuse   Altered mental state   B12 deficiency   COVID-19 virus infection   Drooling   Essential hypertension   Acute metabolic encephalopathy on chronic progressive dementia: Mental status overall unchanged, remains confused, nonverbal not interactive,  is calm and not agitated, remains confused.She was seen by neurology and psychiatry-underwent extensive workup including MRI brain, EEG unrevealing, ANA negative. Cont on Haldol, as needed Zyprexa, hydroxyzine and lorazepam, mirtazapine. Continue to mobilize with PT OT.   Unintentional weight loss, w/ inadequate oral intake:  cont Remeron dysphagia 3 diet and encourage oral intake   Bipolar disorder: Cont on Haldol 2 mg BID, olanzapine as needed, Atarax as needed.Ativan 0.5 mg p.o. every 6 hours as needed for anxiety/agitation.    Vitamin B12 deficiency: Resolved last B12 1195 on 12/11.    Essential hypertension:BP stable-at times soft.   CKD stage IIIa:Creatinine trending up to 1.3 on 4/14 and subsequently improved, continue to encourage oral hydration, she eats well.   Recent Labs    06/06/22 0943 06/16/22 0459 06/24/22 0836 06/26/22 0816 06/28/22 0956 07/04/22 0801 08/02/22 1852 08/03/22 1717 08/04/22 1553 08/07/22 0940  BUN 18 18 19 12 12 16 17 15 14 15   CREATININE 1.12* 1.17* 1.29* 1.23* 1.13* 1.18* 1.19* 1.07* 1.31* 1.14*     Nutrition Problem: Altered GI function Etiology: constipation Signs/Symptoms: other (comment) (last BM 10 days ago) Interventions: Other (Comment) (scheduled bowel regimen per MD)    DVT prophylaxis: enoxaparin (LOVENOX) injection 40 mg Start: 12/26/21 1200 Code Status:   Code Status: Full Code Family Communication: plan of care discussed with patient at bedside. Patient status is: Inpatient because of unsafe disposition Level of care: Med-Surg   Dispo: The patient is from: Behavioral health            Anticipated disposition: Awaiting Memory care Objective: Vitals last 24 hrs: Vitals:   08/08/22 0626 08/08/22 1939 08/09/22 0630 08/09/22 0713  BP: (!) 152/91 100/65 105/69  117/89  Pulse: 90 100 (!) 104   Resp:    16  Temp: (!) 97 F (36.1 C) 98.4 F (36.9 C) 98.9 F (37.2 C) 97.8 F (36.6 C)  TempSrc: Axillary  Oral Oral  SpO2: 100% 96%  100% 100%  Weight: 57.9 kg     Height:       Weight change:   Physical Examination: General exam: AA non verbal HEENT:Oral mucosa moist, Ear/Nose WNL grossly, dentition normal. Respiratory system: bilaterally clear BS, no use of accessory muscle Cardiovascular system: S1 & S2 +, regular rate. Gastrointestinal system: Abdomen soft, NT,ND,BS+ Nervous System:Alert, awake, moving extremities.  Does not follow commands.  Extremities: LE ankle edema neg, lower extremities warm Skin: No rashes,no icterus. WUJ:WJXBJY muscle bulk,tone, power   Medications reviewed:  Scheduled Meds:  vitamin B-12  500 mcg Oral Daily   enoxaparin (LOVENOX) injection  40 mg Subcutaneous Q24H   haloperidol  2 mg Oral BID   hydrocerin   Topical BID   mirtazapine  15 mg Oral QHS   senna-docusate  2 tablet Oral QHS   Continuous Infusions:    Diet Order             DIET DYS 3 Room service appropriate? Yes; Fluid consistency: Thin  Diet effective now                    Nutrition Problem: Altered GI function Etiology: constipation Signs/Symptoms: other (comment) (last BM 10 days ago) Interventions: Other (Comment) (scheduled bowel regimen per MD)   Intake/Output Summary (Last 24 hours) at 08/09/2022 0846 Last data filed at 08/08/2022 0902 Gross per 24 hour  Intake 120 ml  Output --  Net 120 ml    Net IO Since Admission: 55,650.29 mL [08/09/22 0846]  Wt Readings from Last 3 Encounters:  08/08/22 57.9 kg  12/24/21 72 kg  09/05/21 72.6 kg     Unresulted Labs (From admission, onward)    None     Data Reviewed: I have personally reviewed following labs and imaging studies CBC: Recent Labs  Lab 08/02/22 1852 08/03/22 1717  WBC 5.1 4.3  HGB 14.3 13.3  HCT 44.2 39.2  MCV 89.5 86.9  PLT 275 269    Basic Metabolic Panel: Recent Labs  Lab 08/02/22 1852 08/03/22 1717 08/04/22 1553 08/07/22 0940  NA 139 140 142 139  K 4.0 4.0 4.3 5.0  CL 105 107 104 105  CO2 21*   GLUCOSE 82 95 99 89  BUN CREATININE 1.19* 1.07* 1.31* 1.14*  CALCIUM 9.0 9.0 9.5 9.2    No results found for this or any previous visit (from the past 240 hour(s)).  Antimicrobials: Anti-infectives (From admission, onward)    None      Culture/Microbiology    Component Value Date/Time   SDES BLOOD LEFT HAND 01/12/2022 0828   SDES BLOOD RIGHT HAND 01/12/2022 0828   SPECREQUEST  01/12/2022 0828    BOTTLES DRAWN AEROBIC ONLY Blood Culture results may not be optimal due to an inadequate volume of blood received in culture bottles   SPECREQUEST  01/12/2022 0828    BOTTLES DRAWN AEROBIC ONLY Blood Culture results may not be optimal due to an inadequate volume of blood received in culture bottles   CULT  01/12/2022 0828    NO GROWTH 5 DAYS Performed at [facility], [address], [place], [place] [address]    CULT  01/12/2022 [phone]  NO GROWTH 5 DAYS Performed at [facility], [address], [place], [place] [address]    REPTSTATUS 01/17/2022 FINAL 01/12/2022 6045   REPTSTATUS 01/17/2022 FINAL 01/12/2022 4098  Radiology Studies: No results found.   LOS: 226 days   Lanae Boast, MD [facility]  08/09/2022, 8:46 AM

## 2022-08-10 NOTE — Progress Notes (Signed)
PROGRESS NOTE BEREA MAJKOWSKI  WUJ:[id number] DOB: [birth year] DOA: 12/24/2021 PCP: Lavinia Sharps, NP  Brief Narrative/Hospital Course: Ms. 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  Psychiatry and Neurology evaluated patient during her stay.  Psychiatry patient does not meet criteria for inpatient behavioral health and advised memory care unit. Patient is medically stable for discharge and is awaiting placement at a memory care facility.  Significant events: 09/07/2021: admitted to behavioral health treated with Zyprexa Invega and Ativan challenge for catatonia. 12/24/2021: sent to ER from [facility] for evaluation for tachycardia 9/04-9/5, 9/5>9/9, LTM EEG  no seizure. TSH stable CTA chest no PE no pneumonia RPR nonreactive bilateral LE Doppler negative for DVT Echo with normal EF  9/23 febrile secondary to Covid 19 + no pneumonia. 9/25 repeat CT brain unremarkable. 06/24/2022  psychiatry consulted for increasing agitation    Subjective: Seen and examined this morning.   She has tried to to color her drawing. [name] alert nonverbal no other response does not follow commands  Assessment and Plan: Principal Problem:   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) Active Problems:   Delirium due to multiple etiologies, acute, hyperactive   Bipolar affective disorder, current episode manic with psychotic symptoms   Sinus tachycardia   Tobacco abuse   Altered mental state   B12 deficiency   COVID-19 virus infection   Drooling   Essential hypertension   Acute metabolic encephalopathy on chronic progressive dementia: Mental status remains same unchanged - is confused, nonverbal not interactive, is calm and not agitated, remains confused.She was seen by neurology and psychiatry-underwent extensive workup including MRI  brain, EEG unrevealing, ANA negative. Cont on Haldol, as needed Zyprexa, hydroxyzine and lorazepam, mirtazapine. Continue to mobilize with PT OT.   Unintentional weight loss, w/ inadequate oral intake:  cont Remeron dysphagia 3 diet and encourage oral intake   Bipolar disorder: Cont on Haldol 2 mg BID, olanzapine as needed, Atarax as needed.Ativan 0.5 mg p.o. every 6 hours as needed for anxiety/agitation.    Vitamin B12 deficiency: Resolved last B12 1195 on 12/11.    Essential hypertension:BP stable-at times soft.   CKD stage IIIa:Creatinine trending up to 1.3 on 4/14 and subsequently improved, continue to encourage oral hydration, she eats well.   Recent Labs    06/06/22 0943 06/16/22 0459 06/24/22 0836 06/26/22 0816 06/28/22 0956 07/04/22 0801 08/02/22 1852 08/03/22 1717 08/04/22 1553 08/07/22 0940  BUN CREATININE 1.12* 1.17* 1.29* 1.23* 1.13* 1.18* 1.19* 1.07* 1.31* 1.14*     Nutrition Problem: Altered GI function Etiology: constipation Signs/Symptoms: other (comment) (last BM 10 days ago) Interventions: Other (Comment) (scheduled bowel regimen per MD)    DVT prophylaxis: enoxaparin (LOVENOX) injection 40 mg Start: 12/26/21 1200 Code Status:   Code Status: Full Code Family Communication: plan of care discussed with patient at bedside. Patient status is: Inpatient because of unsafe disposition Level of care: Med-Surg   Dispo: The patient is from: Behavioral health            Anticipated disposition: Awaiting Memory care Objective: Vitals last 24 hrs: Vitals:   08/09/22 0713 08/09/22 1930 08/10/22 0558 08/10/22 0835  BP: 117/89 118/76 (!) 157/137 105/76  Pulse:  78 67 84  Resp: Temp: 97.8 F (36.6 C)  98.3 F (36.8 C) 97.9 F (36.6 C) 97.7 F (36.5 C)  TempSrc: Oral Oral Oral Oral  SpO2: 100% 100% 99% 100%  Weight:   56.8 kg   Height:       Weight change:   Physical Examination: General exam: Alert awake nonverbal.    HEENT:Oral mucosa dry, Ear/Nose WNL grossly, dentition normal. Respiratory system: bilaterally clear BS, no use of accessory muscle Cardiovascular system: S1 & S2 +, regular rate. Gastrointestinal system: Soft nontender BS present Nervous System:Alert, awake, moving extremities.  Does not follow commands.  Extremities: LE ankle edema neg, lower extremities warm Skin: No rashes,no icterus. ZOX:WRUEAV muscle bulk,tone, power   Medications reviewed:  Scheduled Meds:  vitamin B-12  500 mcg Oral Daily   enoxaparin (LOVENOX) injection  40 mg Subcutaneous Q24H   haloperidol  2 mg Oral BID   hydrocerin   Topical BID   mirtazapine  15 mg Oral QHS   senna-docusate  2 tablet Oral QHS   Continuous Infusions:    Diet Order             DIET DYS 3 Room service appropriate? Yes; Fluid consistency: Thin  Diet effective now                    Nutrition Problem: Altered GI function Etiology: constipation Signs/Symptoms: other (comment) (last BM 10 days ago) Interventions: Other (Comment) (scheduled bowel regimen per MD)   Intake/Output Summary (Last 24 hours) at 08/10/2022 1121 Last data filed at 08/09/2022 1601 Gross per 24 hour  Intake 300 ml  Output --  Net 300 ml    Net IO Since Admission: 56,000.29 mL [08/10/22 1121]  Wt Readings from Last 3 Encounters:  08/10/22 56.8 kg  12/24/21 72 kg  09/05/21 72.6 kg     Unresulted Labs (From admission, onward)    None     Data Reviewed: I have personally reviewed following labs and imaging studies CBC: Recent Labs  Lab 08/03/22 1717  WBC 4.3  HGB 13.3  HCT 39.2  MCV 86.9  PLT 269    Basic Metabolic Panel: Recent Labs  Lab 08/03/22 1717 08/04/22 1553 08/07/22 0940  NA 140 142 139  K 4.0 4.3 5.0  CL 107 104 105  CO2 22 26 21*  GLUCOSE 95 99 89  BUN 15 14 15   CREATININE 1.07* 1.31* 1.14*  CALCIUM 9.0 9.5 9.2    No results found for this or any previous visit (from the past 240 hour(s)).   Antimicrobials: Anti-infectives (From admission, onward)    None      Culture/Microbiology    Component Value Date/Time   SDES BLOOD LEFT HAND 01/12/2022 0828   SDES BLOOD RIGHT HAND 01/12/2022 0828   SPECREQUEST  01/12/2022 0828    BOTTLES DRAWN AEROBIC ONLY Blood Culture results may not be optimal due to an inadequate volume of blood received in culture bottles   SPECREQUEST  01/12/2022 0828    BOTTLES DRAWN AEROBIC ONLY Blood Culture results may not be optimal due to an inadequate volume of blood received in culture bottles   CULT  01/12/2022 0828    NO GROWTH 5 DAYS Performed at [facility], [address] [place], [place] [address]    CULT  01/12/2022 [phone]    NO GROWTH 5 DAYS Performed at [facility], [address], [place], [place] [address]    REPTSTATUS 01/17/2022 FINAL 01/12/2022 0828   REPTSTATUS 01/17/2022 FINAL 01/12/2022 6213  Radiology  Studies: No results found.   LOS: 227 days   Lanae Boast, MD [facility]  08/10/2022, 11:21 AM

## 2022-08-11 NOTE — Plan of Care (Signed)

## 2022-08-11 NOTE — Progress Notes (Signed)
PROGRESS NOTE Natalie Brown  ZOX:[id number] DOB: 06/28/1965 DOA: 12/24/2021 PCP: Natalie Sharps, NP  Brief Narrative/Hospital Course: 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  Psychiatry and Neurology evaluated patient during her stay.  Psychiatry patient does not meet criteria for inpatient behavioral health and advised memory care unit. Patient is medically stable for discharge and is awaiting placement at a memory care facility.  Significant events: 09/07/2021: admitted to behavioral health treated with Zyprexa Invega and Ativan challenge for catatonia. 12/24/2021: sent to ER from [facility] for evaluation for tachycardia 9/04-9/5, 9/5>9/9, LTM EEG  no seizure. TSH stable CTA chest no PE no pneumonia RPR nonreactive bilateral LE Doppler negative for DVT Echo with normal EF  9/23 febrile secondary to Covid 19 + no pneumonia. 9/25 repeat CT brain unremarkable. 06/24/2022  psychiatry consulted for increasing agitation    Subjective: Seen and examined  [name] alert awake nonverbal  Does not follow commands  Discussed with nursing staff  Unfortunately due to not having staffs she has not had sitter  Assessment and Plan: Principal Problem:   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) Active Problems:   Delirium due to multiple etiologies, acute, hyperactive   Bipolar affective disorder, current episode manic with psychotic symptoms   Sinus tachycardia   Tobacco abuse   Altered mental state   B12 deficiency   COVID-19 virus infection   Drooling   Essential hypertension   Acute metabolic encephalopathy on chronic progressive dementia: Mental status remains same unchanged - is confused, nonverbal not interactive, is calm and not agitated, remains confused.She was seen by neurology and psychiatry-underwent  extensive workup including MRI brain, EEG unrevealing, ANA negative.Cont on Haldol, as needed Zyprexa, hydroxyzine and lorazepam, mirtazapine. Continue to mobilize with PT OT. Discussed with nursing staff to monitor closely,have sitter at bedside.   Unintentional weight loss, w/ inadequate oral intake:  cont Remeron dysphagia 3 diet and encourage oral intake   Bipolar disorder: Cont on Haldol 2 mg BID, olanzapine as needed, Atarax as needed.Ativan 0.5 mg p.o. every 6 hours as needed for anxiety/agitation.    Vitamin B12 deficiency: Resolved last B12 1195 on 12/11.    Essential hypertension:BP stable-at times soft.   CKD stage IIIa:Creatinine trending up to 1.3 on 4/14 and subsequently improved, continue to encourage oral hydration, she eats well.   Recent Labs    06/06/22 0943 06/16/22 0459 06/24/22 0836 06/26/22 0816 06/28/22 0956 07/04/22 0801 08/02/22 1852 08/03/22 1717 08/04/22 1553 08/07/22 0940  BUN CREATININE 1.12* 1.17* 1.29* 1.23* 1.13* 1.18* 1.19* 1.07* 1.31* 1.14*     Nutrition Problem: Altered GI function Etiology: constipation Signs/Symptoms: other (comment) (last BM 10 days ago) Interventions: Other (Comment) (scheduled bowel regimen per MD)    DVT prophylaxis: enoxaparin (LOVENOX) injection 40 mg Start: 12/26/21 1200 Code Status:   Code Status: Full Code Family Communication: plan of care discussed with patient at bedside. Patient status is: Inpatient because of unsafe disposition Level of care: Med-Surg   Dispo: The patient is from: Behavioral health            Anticipated disposition: Awaiting Memory care Objective: Vitals last 24 hrs: Vitals:   08/09/22 1930 08/10/22 0558 08/10/22 0835 08/10/22 1555  BP: 118/76 (!) 157/137 105/76 105/68  Pulse: 78 67  84 62  Resp:  Temp:  97.9 F (36.6 C) 97.7 F (36.5 C) 97.8 F (36.6 C)  TempSrc: Oral Oral Oral Oral  SpO2: 100% 99% 100%   Weight:  56.8 kg    Height:        Weight change:   Physical Examination: General exam: AA,non verbal HEENT:Oral mucosa moist, Ear/Nose WNL grossly, dentition normal. Respiratory system: bilaterally clear BS,  Cardiovascular system: S1 & S2 +, regular rate. Gastrointestinal system: Abdomen soft,NT,ND,BS+ Nervous System:Alert, awake, moving extremities and grossly nonfocal Extremities: LE ankle edema neg, lower extremities warm Skin: No rashes,no icterus. MSK: Normal muscle bulk,tone, power   Medications reviewed:  Scheduled Meds:  vitamin B-12  500 mcg Oral Daily   enoxaparin (LOVENOX) injection  40 mg Subcutaneous Q24H   haloperidol  2 mg Oral BID   hydrocerin   Topical BID   mirtazapine  15 mg Oral QHS   senna-docusate  2 tablet Oral QHS   Continuous Infusions:    Diet Order             DIET DYS 3 Room service appropriate? Yes; Fluid consistency: Thin  Diet effective now                    Nutrition Problem: Altered GI function Etiology: constipation Signs/Symptoms: other (comment) (last BM 10 days ago) Interventions: Other (Comment) (scheduled bowel regimen per MD)  No intake or output data in the 24 hours ending 08/11/22 0755  Net IO Since Admission: 56,000.29 mL [08/11/22 0755]  Wt Readings from Last 3 Encounters:  08/10/22 56.8 kg  12/24/21 72 kg  09/05/21 72.6 kg     Unresulted Labs (From admission, onward)    None     Data Reviewed: I have personally reviewed following labs and imaging studies CBC: No results for input(s): "WBC", "NEUTROABS", "HGB", "HCT", "MCV", "PLT" in the last 168 hours.  Basic Metabolic Panel: Recent Labs  Lab 08/04/22 1553 08/07/22 0940  NA 142 139  K 4.3 5.0  CL 104 105  CO2 26 21*  GLUCOSE 99 89  BUN 14 15  CREATININE 1.31* 1.14*  CALCIUM 9.5 9.2    No results found for this or any previous visit (from the past 240 hour(s)).  Antimicrobials: Anti-infectives (From admission, onward)    None      Culture/Microbiology    Component  Value Date/Time   SDES BLOOD LEFT HAND 01/12/2022 0828   SDES BLOOD RIGHT HAND 01/12/2022 0828   SPECREQUEST  01/12/2022 0828    BOTTLES DRAWN AEROBIC ONLY Blood Culture results may not be optimal due to an inadequate volume of blood received in culture bottles   SPECREQUEST  01/12/2022 0828    BOTTLES DRAWN AEROBIC ONLY Blood Culture results may not be optimal due to an inadequate volume of blood received in culture bottles   CULT  01/12/2022 0828    NO GROWTH 5 DAYS Performed at [facility], [address] [place], [place] [address]    CULT  01/12/2022 [phone]    NO GROWTH 5 DAYS Performed at [facility], [address], [place], [place] [address]    REPTSTATUS 01/17/2022 FINAL 01/12/2022 1914   REPTSTATUS 01/17/2022 FINAL 01/12/2022 7829  Radiology Studies: No results found.   LOS: 228 days   Lanae Boast, MD [facility]  08/11/2022, 7:55 AM

## 2022-08-12 MED ORDER — BISACODYL 10 MG RE SUPP: 10.0000 mg | Freq: Every day | RECTAL | Status: DC | PRN

## 2022-08-12 NOTE — Progress Notes (Signed)
CSW spoke with Britta Mccreedy at [facility] that states the patient is too young to be accepted into the facility.  Edwin Dada, MSW, LCSW Transitions of Care  Clinical Social Worker II [phone]

## 2022-08-12 NOTE — Progress Notes (Signed)
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  Psychiatry and Neurology evaluated patient during her stay.  Psychiatry patient does not meet criteria for inpatient behavioral health and advised memory care unit. Patient is medically stable for discharge and is awaiting placement at a memory care facility.  Significant events: 09/07/2021: admitted to behavioral health treated with Zyprexa Invega and Ativan challenge for catatonia. 12/24/2021: sent to ER from [facility] for evaluation for tachycardia 9/04-9/5, 9/5>9/9, LTM EEG  no seizure. TSH stable CTA chest no PE no pneumonia RPR nonreactive bilateral LE Doppler negative for DVT Echo with normal EF  9/23 febrile secondary to Covid 19 + no pneumonia. 9/25 repeat CT brain unremarkable. 06/24/2022  psychiatry consulted for increasing agitation    Subjective:  Seen and examined. 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Cont on Haldol, as needed Zyprexa, hydroxyzine and lorazepam, Natalie Brown. Continue to mobilize with PT OT. Discussed with nursing staff to monitor closely,have sitter at bedside.   Unintentional weight loss, w/ inadequate oral intake:  cont Remeron dysphagia 3 diet and encourage oral intake   Bipolar disorder: Mood is stable.  Cont on Haldol 2 mg BID, olanzapine as needed, Atarax as needed.Ativan 0.5 mg p.o. every 6 hours as needed for anxiety/agitation.    Vitamin B12 deficiency: Resolved last B12 1195 on 12/11.    Essential hypertension:BP stable-at times soft.   CKD stage IIIa:Creatinine trending up to 1.3 on 4/14 and subsequently improved, continue to encourage oral hydration, she eats well.   Recent Labs    06/06/22 0943 06/16/22 0459 06/24/22 0836 06/26/22 0816 06/28/22 0956 07/04/22 0801 08/02/22 1852 08/03/22 1717 08/04/22 1553 08/07/22 0940  BUN CREATININE 1.12* 1.17* 1.29* 1.23* 1.13* 1.18* 1.19* 1.07* 1.31* 1.14*     Nutrition Problem: Altered GI function Etiology: constipation Signs/Symptoms: other (comment) (last BM 10 days ago) Interventions: Other (Comment) (scheduled bowel regimen per MD)    DVT prophylaxis: enoxaparin (LOVENOX) injection 40 mg Start: 12/26/21 1200 Code Status:   Code Status: Full Code Family Communication: plan of care discussed with patient at bedside. Patient status is: Inpatient because of unsafe disposition Level of care: Med-Surg   Dispo: The patient is from: Behavioral health            Anticipated disposition: Awaiting Memory care Objective: Vitals last 24 hrs: Vitals:   08/10/22 0835 08/10/22 1555 08/11/22 0755 08/11/22 1645  BP: 105/76 105/68 129/73 102/70  Pulse: 84 62 82 (!) 59  Resp:  Temp: 97.7 F (36.5 C) 97.8 F (36.6 C) 98.6 F (37 C) 98.2 F (36.8 C)  TempSrc: Oral Oral Oral   SpO2: 100%   100%  Weight:      Height:       Weight change:    Physical Examination: General exam: sleepy,weak,older appearing HEENT:Oral mucosa moist, Ear/Nose WNL grossly, dentition normal. Respiratory system: bilaterally clear BS, no use of accessory muscle Cardiovascular system: S1 & S2 +, regular rate. Gastrointestinal system: Abdomen soft, NT,ND,BS+ Nervous System:sleepy Extremities: LE ankle edema neg, lower extremities warm Skin: No rashes,no icterus. MSK: Normal muscle bulk,tone, power   Medications reviewed:  Scheduled Meds:  vitamin B-12  500 mcg Oral Daily   enoxaparin (LOVENOX) injection  40 mg Subcutaneous Q24H   haloperidol  2 mg Oral BID   hydrocerin   Topical BID   Natalie Brown  15 mg Oral QHS   senna-docusate  2 tablet Oral QHS   Continuous Infusions:    Diet Order             DIET DYS 3 Room service appropriate? Yes; Fluid consistency: Thin  Diet effective now                    Nutrition Problem: Altered GI function Etiology: constipation Signs/Symptoms: other (comment) (last BM 10 days ago) Interventions: Other (Comment) (scheduled bowel regimen per MD)  No intake or output data in the 24 hours ending 08/12/22 0757  Net IO Since Admission: 56,000.29 mL [08/12/22 0757]  Wt Readings from Last 3 Encounters:  08/10/22 56.8 kg  12/24/21 72 kg  09/05/21 72.6 kg     Unresulted Labs (From admission, onward)    None     Data Reviewed: I have personally reviewed following labs and imaging studies CBC: No results for input(s): "WBC", "NEUTROABS", "HGB", "HCT", "MCV", "PLT" in the last 168 hours.  Basic Metabolic Panel: Recent Labs  Lab 08/07/22 0940  NA 139  K 5.0  CL 105  CO2 21*  GLUCOSE 89  BUN 15  CREATININE 1.14*  CALCIUM 9.2    No results found for this or any previous visit (from the past 240 hour(s)).  Antimicrobials: Anti-infectives (From admission, onward)    None      Culture/Microbiology    Component Value Date/Time   SDES BLOOD LEFT HAND 01/12/2022 0828   SDES BLOOD  RIGHT HAND 01/12/2022 0828   SPECREQUEST  01/12/2022 0828    BOTTLES DRAWN AEROBIC ONLY Blood Culture results may not be optimal due to an inadequate volume of blood received in culture bottles   SPECREQUEST  01/12/2022 0828    BOTTLES DRAWN AEROBIC ONLY Blood Culture results may not be optimal due to an inadequate volume of blood received in culture bottles   CULT  01/12/2022 0828    NO GROWTH 5 DAYS Performed at [facility], [address], [place], [place] [address]    CULT  01/12/2022 [phone]    NO GROWTH 5 DAYS Performed at [facility], [address] [place], [place] [address]    REPTSTATUS 01/17/2022 FINAL 01/12/2022 5621   REPTSTATUS 01/17/2022 FINAL 01/12/2022 3086  Radiology Studies: No results found.   LOS: 229 days   Lanae Boast, MD [facility]  08/12/2022, 7:57 AM

## 2022-08-12 NOTE — Progress Notes (Signed)
Nutrition Follow-up  DOCUMENTATION CODES:   Not applicable  INTERVENTION:  - Continue scheduled bowel regimen.   NUTRITION DIAGNOSIS:   Altered GI function related to constipation as evidenced by other (comment) (last BM 10 days ago). - Ongoing   GOAL:   Other (Comment) (more frequent bowel movements) - Ongoing   MONITOR:   I & O's  REASON FOR ASSESSMENT:   LOS    ASSESSMENT:   57 y.o. female admits related to AMS. PMH includes: Bipolar disorde rand HTN. Pt is currently receiving medical management related to early onset dementia with behavioral disturbances.  Meds reviewed:  Vit B12, remeron, senokot. Labs reviewed: WDL.   Pt continues with good PO intakes. Per record, pt has eaten mostly 60-100% of her meals over the past 7 days. Pt is meeting her needs at this time. Per record, pt has experienced a 6% wt loss over the past month. RD will continue to closely monitor PO intakes.   Diet Order:   Diet Order             DIET DYS 3 Room service appropriate? Yes; Fluid consistency: Thin  Diet effective now                   EDUCATION NEEDS:   Not appropriate for education at this time  Skin:  Skin Assessment: Reviewed RN Assessment  Last BM:  4/15  Height:   Ht Readings from Last 1 Encounters:  02/03/22  (1.676 m)    Weight:   Wt Readings from Last 1 Encounters:  08/10/22 56.8 kg    Ideal Body Weight:     BMI:  Body mass index is 20.21 kg/m.  Estimated Nutritional Needs:   Kcal:  1800-2160 kcals  Protein:  90-105 gm  Fluid:  >/= 1.8 L  Bethann Humble, RD, LDN, CNSC.

## 2022-08-13 NOTE — Progress Notes (Signed)
PROGRESS NOTE Natalie Brown  ZOX:[id number] DOB: [birth year] DOA: 12/24/2021 PCP: Natalie Sharps, NP  Brief Narrative/Hospital Course: 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  Psychiatry and Neurology evaluated patient during her stay.  Psychiatry patient does not meet criteria for inpatient behavioral health and advised memory care unit. Patient is medically stable for discharge and is awaiting placement at a memory care facility.  Significant events: 09/07/2021: admitted to behavioral health treated with Zyprexa Invega and Ativan challenge for catatonia. 12/24/2021: sent to ER from [facility] for evaluation for tachycardia 9/04-9/5, 9/5>9/9, LTM EEG  no seizure. TSH stable CTA chest no PE no pneumonia RPR nonreactive bilateral LE Doppler negative for DVT Echo with normal EF  9/23 febrile secondary to Covid 19 + no pneumonia. 9/25 repeat CT brain unremarkable. 06/24/2022  psychiatry consulted for increasing agitation    Subjective: Seen and examined, resting on the bed, she ate her meal, sitter at the bedside Past 24 hours patient has been afebrile, BP stable   Assessment and Plan: Principal Problem:   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) Active Problems:   Delirium due to multiple etiologies, acute, hyperactive   Bipolar affective disorder, current episode manic with psychotic symptoms   Sinus tachycardia   Tobacco abuse   Altered mental state   B12 deficiency   COVID-19 virus infection   Drooling   Essential hypertension Sertraline was on valacyclovir. Acute metabolic encephalopathy on chronic progressive dementia: On exam mental status -confused, nonverbal, not interactive, but calm. Seen by neurology and psychiatry-underwent extensive workup including MRI brain, EEG unrevealing, ANA  negative. Cont on Haldol, as needed Zyprexa, hydroxyzine and lorazepam, mirtazapine PER PSYCH Continue delirium precaution fall precaution  AND safety sitter   Unintentional weight loss, w/ inadequate oral intake:  cont Remeron dysphagia 3 diet and encourage oral intake   Bipolar disorder: Mood is stable.  Cont on Haldol 2 mg BID, olanzapine as needed, Atarax as needed.Ativan 0.5 mg p.o. every 6 hours as needed for anxiety/agitation.    Vitamin B12 deficiency: Resolved last B12 1195 on 12/11.    Essential hypertension:BP stable-at times soft.   CKD stage IIIa:Creatinine trending up to 1.3 on 4/14 and subsequently improved, continue to encourage oral hydration, she eats well.   Recent Labs    06/06/22 0943 06/16/22 0459 06/24/22 0836 06/26/22 0816 06/28/22 0956 07/04/22 0801 08/02/22 1852 08/03/22 1717 08/04/22 1553 08/07/22 0940  BUN CREATININE 1.12* 1.17* 1.29* 1.23* 1.13* 1.18* 1.19* 1.07* 1.31* 1.14*    Nutrition Problem: Altered GI function Etiology: constipation Signs/Symptoms: other (comment) (last BM 10 days ago) Interventions: Other (Comment) (scheduled bowel regimen per MD)    DVT prophylaxis: enoxaparin (LOVENOX) injection 40 mg Start: 12/26/21 1200 Code Status:   Code Status: Full Code Family Communication: plan of care discussed with patient at bedside. Patient status is: Inpatient because of unsafe disposition Level of care: Med-Surg   Dispo: The patient is from: Behavioral health            Anticipated disposition: Awaiting Memory care Objective: Vitals last 24 hrs: Vitals:   08/12/22 0821 08/12/22 0825 08/13/22 0720 08/13/22 0838  BP: 109/73   113/74  Pulse: 83     Resp: 18     Temp: 98.8 F (37.1 C)  98.2 F (36.8 C)  TempSrc: Oral   Oral  SpO2: (!) 84% 92%  100%  Weight:   55.9 kg   Height:       Weight change:   Physical Examination: General exam: AAO, weak,older appearing HEENT:Oral mucosa moist, Ear/Nose  WNL grossly, dentition normal. Respiratory system: bilaterally clear BS,no use of accessory muscle Cardiovascular system: S1 & S2 +, regular rate. Gastrointestinal system: Abdomen soft,NT,ND,BS+ Nervous System:Alert, awake, moving extremities and grossly nonfocal Extremities: LE ankle edema neg, lower extremities warm Skin:No rashes,no icterus. ZOX:WRUEAV muscle bulk,tone, power   Medications reviewed:  Scheduled Meds:  vitamin B-12  500 mcg Oral Daily   enoxaparin (LOVENOX) injection  40 mg Subcutaneous Q24H   haloperidol  2 mg Oral BID   hydrocerin   Topical BID   mirtazapine  15 mg Oral QHS   senna-docusate  2 tablet Oral QHS  Continuous Infusions:  Diet Order             DIET DYS 3 Room service appropriate? Yes; Fluid consistency: Thin  Diet effective now                 Nutrition Problem: Altered GI function Etiology: constipation Signs/Symptoms: other (comment) (last BM 10 days ago) Interventions: Other (Comment) (scheduled bowel regimen per MD)  Intake/Output Summary (Last 24 hours) at 08/13/2022 1108 Last data filed at 08/12/2022 1235 Gross per 24 hour  Intake 237 ml  Output --  Net 237 ml  Net IO Since Admission: 56,537.29 mL [08/13/22 1108]  Wt Readings from Last 3 Encounters:  08/13/22 55.9 kg  12/24/21 72 kg  09/05/21 72.6 kg     Unresulted Labs (From admission, onward)    None     Data Reviewed: I have personally reviewed following labs and imaging studies CBC:No results for input(s): "WBC", "NEUTROABS", "HGB", "HCT", "MCV", "PLT" in the last 168 hours. Basic Metabolic Panel: Recent Labs  Lab 08/07/22 0940  NA 139  K 5.0  CL 105  CO2 21*  GLUCOSE 89  BUN 15  CREATININE 1.14*  CALCIUM 9.2  No results found for this or any previous visit (from the past 240 hour(s)).  Antimicrobials: Anti-infectives (From admission, onward)    None      Culture/Microbiology    Component Value Date/Time   SDES BLOOD LEFT HAND 01/12/2022 0828   SDES  BLOOD RIGHT HAND 01/12/2022 0828   SPECREQUEST  01/12/2022 0828    BOTTLES DRAWN AEROBIC ONLY Blood Culture results may not be optimal due to an inadequate volume of blood received in culture bottles   SPECREQUEST  01/12/2022 0828    BOTTLES DRAWN AEROBIC ONLY Blood Culture results may not be optimal due to an inadequate volume of blood received in culture bottles   CULT  01/12/2022 0828    NO GROWTH 5 DAYS Performed at [facility], [address], [place], [place] [address]    CULT  01/12/2022 [phone]    NO GROWTH 5 DAYS Performed at [facility], [address] [place], [place] [address]    REPTSTATUS 01/17/2022 FINAL 01/12/2022 6213   REPTSTATUS 01/17/2022 FINAL 01/12/2022 0865  Radiology Studies: No results found.   LOS: 230 days   Lanae Boast, MD [facility]  08/13/2022, 11:08 AM

## 2022-08-13 NOTE — TOC Progression Note (Signed)
Transition of Care [facility]) - Progression Note    Patient Details  Name: Natalie Brown MRN: [id number] Date of Birth: January 21, 1966  Transition [facility]) CM/SW Contact  Inis Sizer, LCSW Phone Number: 08/13/2022, 1:50 PM  Clinical Narrative:    CSW spoke with patient's mother to discuss updates regarding discharge plan. Once CSW is made aware of a determination from [facility] owner, CSW will inform patient's mother.   Barriers to Discharge: Financial Resources, Inadequate or no insurance, Homeless with medical needs  Expected Discharge Plan and Services In-house Referral: Clinical Social Work   Post Acute Care Choice: Nursing Home Living arrangements for the past 2 months: Homeless [facility]) Expected Discharge Date: 12/24/21                                     Social Determinants of Health (SDOH) Interventions SDOH Screenings   Food Insecurity: No Food Insecurity (03/05/2022)  Housing: High Risk (03/21/2022)  Alcohol Screen: Low Risk  (09/06/2021)  Depression (PHQ2-9): Medium Risk (05/26/2020)  Tobacco Use: High Risk (03/21/2022)    Readmission Risk Interventions     No data to display

## 2022-08-13 NOTE — TOC Progression Note (Addendum)
Transition of Care [facility]) - Progression Note    Patient Details  Name: Natalie Brown MRN: [id number] Date of Birth: November 13, 1965  Transition of Care [facility]) CM/SW Contact  Inis Sizer, LCSW Phone Number: 08/13/2022, 8:25 AM  Clinical Narrative:    CSW attempted to reach [place], [facility] owner without success - a voicemail was let requesting a return call.    Barriers to Discharge: Financial Resources, Inadequate or no insurance, Homeless with medical needs  Expected Discharge Plan and Services In-house Referral: Clinical Social Work   Post Acute Care Choice: Nursing Home Living arrangements for the past 2 months: Homeless [facility]) Expected Discharge Date: 12/24/21                    Social Determinants of Health (SDOH) Interventions SDOH Screenings   Food Insecurity: No Food Insecurity (03/05/2022)  Housing: High Risk (03/21/2022)  Alcohol Screen: Low Risk  (09/06/2021)  Depression (PHQ2-9): Medium Risk (05/26/2020)  Tobacco Use: High Risk (03/21/2022)    Readmission Risk Interventions     No data to display

## 2022-08-14 NOTE — Progress Notes (Signed)
PROGRESS NOTE Natalie Brown  ZOX:[id number] DOB: 1966-04-01 DOA: 12/24/2021 PCP: Lavinia Sharps, NP  Brief Narrative/Hospital Course: 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  Psychiatry and Neurology evaluated patient during her stay.  Psychiatry patient does not meet criteria for inpatient behavioral health and advised memory care unit. Patient is medically stable for discharge and is awaiting placement at a memory care facility.  Significant events: 09/07/2021: admitted to behavioral health treated with Zyprexa Invega and Ativan challenge for catatonia. 12/24/2021: sent to ER from [facility] for evaluation for tachycardia 9/04-9/5, 9/5>9/9, LTM EEG  no seizure. TSH stable CTA chest no PE no pneumonia RPR nonreactive bilateral LE Doppler negative for DVT Echo with normal EF  9/23 febrile secondary to Covid 19 + no pneumonia. 9/25 repeat CT brain unremarkable. 06/24/2022  psychiatry consulted for increasing agitation    Subjective: Patient seen and examined this morning she is ambulating in the room One-to-one sitter at the bedside for safety. No acute events overnight.  Assessment and Plan: Principal Problem:   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) Active Problems:   Delirium due to multiple etiologies, acute, hyperactive   Bipolar affective disorder, current episode manic with psychotic symptoms   Sinus tachycardia   Tobacco abuse   Altered mental state   B12 deficiency   COVID-19 virus infection   Drooling   Essential hypertension  Acute metabolic encephalopathy on chronic progressive dementia: Patient with unchanged mental status remains confused, nonverbal, not interactive, but calm. Seen by neurology and psychiatry-underwent extensive workup including MRI brain, EEG unrevealing, ANA  negative. Cont on Haldol, as needed Zyprexa, hydroxyzine and lorazepam, mirtazapine PER PSYCH Continue delirium precaution fall precaution  AND safety sitter   Unintentional weight loss, w/ inadequate oral intake:  cont Remeron dysphagia 3 diet and encourage oral intake   Bipolar disorder: Mood is stable.  Cont on Haldol 2 mg BID, olanzapine as needed, Atarax as needed.Ativan 0.5 mg p.o. every 6 hours as needed for anxiety/agitation.    Vitamin B12 deficiency: Resolved last B12 1195 on 12/11.    Essential hypertension:BP stable-at times soft.   CKD stage IIIa:Creatinine trending up to 1.3 on 4/14 and subsequently improved, continue to encourage oral hydration, she eats well.   Recent Labs    06/06/22 0943 06/16/22 0459 06/24/22 0836 06/26/22 0816 06/28/22 0956 07/04/22 0801 08/02/22 1852 08/03/22 1717 08/04/22 1553 08/07/22 0940  BUN CREATININE 1.12* 1.17* 1.29* 1.23* 1.13* 1.18* 1.19* 1.07* 1.31* 1.14*     Nutrition Problem: Altered GI function Etiology: constipation Signs/Symptoms: other (comment) (last BM 10 days ago) Interventions: Other (Comment) (scheduled bowel regimen per MD)    DVT prophylaxis: enoxaparin (LOVENOX) injection 40 mg Start: 12/26/21 1200 Code Status:   Code Status: Full Code Family Communication: plan of care discussed with patient at bedside. Patient status is: Inpatient because of unsafe disposition Level of care: Med-Surg   Dispo: The patient is from: Behavioral health            Anticipated disposition: Awaiting Memory care Objective: Vitals last 24 hrs: Vitals:   08/13/22 0838 08/13/22 2112 08/14/22 0515 08/14/22 0724  BP: 113/74 109/70 (!) 87/60 124/74  Pulse:  (!) 113 85 91  Resp:  Temp: 98.2 F (36.8 C) 98.2  F (36.8 C) (!) 97.5 F (36.4 C) 98.6 F (37 C)  TempSrc: Oral Oral Oral Oral  SpO2: 100% 100% 100% 99%  Weight:      Height:       Weight change:   Physical Examination: General  exam: alert awake, nonverbal does not follow commands HEENT:Oral mucosa moist, Ear/Nose WNL grossly Respiratory system: Bilaterally clear BS, no use of accessory muscle Cardiovascular system: S1 & S2 +, No JVD. Gastrointestinal system: Abdomen soft,NT,ND, BS+ Nervous System: Alert, awake, moving extremities, she follows commands. Extremities: LE edema neg,distal peripheral pulses palpable.  Skin: No rashes,no icterus. MSK: Normal muscle bulk,tone, power  Medications reviewed:  Scheduled Meds:  vitamin B-12  500 mcg Oral Daily   enoxaparin (LOVENOX) injection  40 mg Subcutaneous Q24H   haloperidol  2 mg Oral BID   hydrocerin   Topical BID   mirtazapine  15 mg Oral QHS   senna-docusate  2 tablet Oral QHS  Continuous Infusions:  Diet Order             DIET DYS 3 Room service appropriate? Yes; Fluid consistency: Thin  Diet effective now                 Nutrition Problem: Altered GI function Etiology: constipation Signs/Symptoms: other (comment) (last BM 10 days ago) Interventions: Other (Comment) (scheduled bowel regimen per MD)  Intake/Output Summary (Last 24 hours) at 08/14/2022 1055 Last data filed at 08/14/2022 0900 Gross per 24 hour  Intake 120 ml  Output --  Net 120 ml   Net IO Since Admission: 56,657.29 mL [08/14/22 1055]  Wt Readings from Last 3 Encounters:  08/13/22 55.9 kg  12/24/21 72 kg  09/05/21 72.6 kg     Unresulted Labs (From admission, onward)    None     Data Reviewed: I have personally reviewed following labs and imaging studies CBC:No results for input(s): "WBC", "NEUTROABS", "HGB", "HCT", "MCV", "PLT" in the last 168 hours. Basic Metabolic Panel: No results for input(s): "NA", "K", "CL", "CO2", "GLUCOSE", "BUN", "CREATININE", "CALCIUM", "MG", "PHOS" in the last 168 hours. No results found for this or any previous visit (from the past 240 hour(s)).  Antimicrobials: Anti-infectives (From admission, onward)    None       Culture/Microbiology    Component Value Date/Time   SDES BLOOD LEFT HAND 01/12/2022 0828   SDES BLOOD RIGHT HAND 01/12/2022 0828   SPECREQUEST  01/12/2022 0828    BOTTLES DRAWN AEROBIC ONLY Blood Culture results may not be optimal due to an inadequate volume of blood received in culture bottles   SPECREQUEST  01/12/2022 0828    BOTTLES DRAWN AEROBIC ONLY Blood Culture results may not be optimal due to an inadequate volume of blood received in culture bottles   CULT  01/12/2022 0828    NO GROWTH 5 DAYS Performed at [facility], [address] [place], [place] [address]    CULT  01/12/2022 [phone]    NO GROWTH 5 DAYS Performed at [facility], [address] [place], [place] [address]    REPTSTATUS 01/17/2022 FINAL 01/12/2022 [id number]   REPTSTATUS 01/17/2022 FINAL 01/12/2022 9629  Radiology Studies: No results found.   LOS: 231 days   Lanae Boast, MD [facility]  08/14/2022, 10:55 AM

## 2022-08-15 NOTE — TOC Progression Note (Addendum)
Transition of Care [facility]) - Progression Note    Patient Details  Name: Natalie Brown MRN: [id number] Date of Birth: 10/06/1965  [facility]) CM/SW Contact  Inis Sizer, LCSW Phone Number: 08/15/2022, 10:14 AM  Clinical Narrative:    10am: CSW spoke with Helmut Muster [facility] owner who states she cannot accept patient due to the home not being locked.  CSW spoke with [place] at [facility] who states she will review referral and send it to administration for review.  10:40am: Patient denied at [facility].   CSW spoke with [place] at [employer] to request she review patient. [place] states she plans on meeting patient tomorrow to determine if a bed offer can be made.   Barriers to Discharge: Financial Resources, Inadequate or no insurance, Homeless with medical needs  Expected Discharge Plan and Services In-house Referral: Clinical Social Work   Post Acute Care Choice: Nursing Home Living arrangements for the past 2 months: Homeless [name]) Expected Discharge Date: 12/24/21                   Social Determinants of Health (SDOH) Interventions SDOH Screenings   Food Insecurity: No Food Insecurity (03/05/2022)  Housing: High Risk (03/21/2022)  Alcohol Screen: Low Risk  (09/06/2021)  Depression (PHQ2-9): Medium Risk (05/26/2020)  Tobacco Use: High Risk (03/21/2022)    Readmission Risk Interventions     No data to display

## 2022-08-15 NOTE — Progress Notes (Signed)
Natalie Brown has finally woken up. She missed her night medicine as she was fast asleep. Now roaming in and out of her room.

## 2022-08-15 NOTE — Progress Notes (Signed)
PROGRESS NOTE Natalie Brown  UJW:[id number] DOB: 12/04/1965 DOA: 12/24/2021 PCP: Lavinia Sharps, NP  Brief Narrative/Hospital Course: Natalie Brown is a 57 yo female with a PMH of Bipolar Affective Disorder and Chronic Dementia with Advanced Cerebral Atrophy and Behavioral Disturbances who was admitted on 09/07/21 with acute mania and aggressive behavior.  Psychiatry and Neurology evaluated patient during her stay.  Psychiatry patient does not meet criteria for inpatient behavioral health and advised memory care unit. Patient is medically stable for discharge and is awaiting placement at a memory care facility.  Significant events: 09/07/2021: admitted to behavioral health treated with Zyprexa Invega and Ativan challenge for catatonia. 12/24/2021: sent to ER from [facility] for evaluation for tachycardia 9/04-9/5, 9/5>9/9, LTM EEG  no seizure. TSH stable CTA chest no PE no pneumonia RPR nonreactive bilateral LE Doppler negative for DVT [place] with normal EF  9/23 febrile secondary to Covid 19 + no pneumonia. 9/25 repeat CT brain unremarkable. 06/24/2022  psychiatry consulted for increasing agitation    Subjective: Seen and examined, Overnight vitals/labs/events reviewed  [name] alert awake nonverbal confused does not follow commands  Assessment and Plan: Principal Problem:   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) Active Problems:   Delirium due to multiple etiologies, acute, hyperactive   Bipolar affective disorder, current episode manic with psychotic symptoms   Sinus tachycardia   Tobacco abuse   Altered mental state   B12 deficiency   COVID-19 virus infection   Drooling   Essential hypertension  Acute metabolic encephalopathy on chronic progressive dementia: Mental status rmains confused, nonverbal, not interactive, but calm. Seen by neurology and psychiatry-underwent extensive workup including MRI brain, EEG unrevealing, ANA negative. Cont on Haldol, as needed Zyprexa,  hydroxyzine and lorazepam, mirtazapine PER PSYCH> at times she misses her medication due to being in sleep. Continue delirium precaution fall precaution and safety sitter which at times not available due to staffing issues-charge nurse aware and I had discussed with then multiple times   Unintentional weight loss, w/ inadequate oral intake:  cont Remeron dysphagia 3 diet and encourage oral intake   Bipolar disorder: Mood is stable.  Cont on Haldol 2 mg BID, olanzapine as needed, Atarax as needed.Ativan 0.5 mg p.o. q 6 hr prn for anxiety/agitation.    Vitamin B12 deficiency: Resolved last B12 1195 on 12/11.    Essential hypertension:BP stable-at times soft.  Monitor   CKD stage IIIa:Creatinine remains stable at 1-1.3 range, encourage oral hydration, trend as below intermittently Recent Labs    06/06/22 0943 06/16/22 0459 06/24/22 0836 06/26/22 0816 06/28/22 0956 07/04/22 0801 08/02/22 1852 08/03/22 1717 08/04/22 1553 08/07/22 0940  BUN CREATININE 1.12* 1.17* 1.29* 1.23* 1.13* 1.18* 1.19* 1.07* 1.31* 1.14*     Nutrition Problem: Altered GI function Etiology: constipation Signs/Symptoms: other (comment) (last BM 10 days ago) Interventions: Other (Comment) (scheduled bowel regimen per MD)    DVT prophylaxis: enoxaparin (LOVENOX) injection 40 mg Start: 12/26/21 1200 Code Status:   Code Status: Full Code Family Communication: plan of care discussed with patient at bedside. Patient status is: Inpatient because of unsafe disposition Level of care: Med-Surg   Dispo: The patient is from: Behavioral health            Anticipated disposition: Awaiting Memory care Objective: Vitals last 24 hrs: Vitals:   08/13/22 2112 08/14/22 0515 08/14/22 0724 08/15/22 0522  BP: 109/70 (!) 87/60 124/74 (!) 121/96  Pulse: (!) 113 85  91   Resp: Temp: 98.2 F (36.8 C) (!) 97.5 F (36.4 C) 98.6 F (37 C) 98.6 F (37 C)  TempSrc: Oral Oral Oral   SpO2:  100% 100% 99% 96%  Weight:      Height:       Weight change:   Physical Examination: General exam: alert awake, nonverbal, calm, older than stated age HEENT:Oral mucosa moist, Ear/Nose WNL grossly Respiratory system: Bilaterally clear BS,no use of accessory muscle Cardiovascular system: S1 & S2 +, No JVD. Gastrointestinal system: Abdomen soft,NT,ND, BS+ Nervous System: Alert, awake, moving extremities Extremities: LE edema neg,distal peripheral pulses palpable.  Skin: No rashes,no icterus. MSK: Normal muscle bulk,tone, power   Medications reviewed:  Scheduled Meds:  vitamin B-12  500 mcg Oral Daily   enoxaparin (LOVENOX) injection  40 mg Subcutaneous Q24H   haloperidol  2 mg Oral BID   hydrocerin   Topical BID   mirtazapine  15 mg Oral QHS   senna-docusate  2 tablet Oral QHS  Continuous Infusions:  Diet Order             DIET DYS 3 Room service appropriate? Yes; Fluid consistency: Thin  Diet effective now                 Nutrition Problem: Altered GI function Etiology: constipation Signs/Symptoms: other (comment) (last BM 10 days ago) Interventions: Other (Comment) (scheduled bowel regimen per MD)  Intake/Output Summary (Last 24 hours) at 08/15/2022 0824 Last data filed at 08/14/2022 1300 Gross per 24 hour  Intake 205 ml  Output --  Net 205 ml   Net IO Since Admission: 56,742.29 mL [08/15/22 0824]  Wt Readings from Last 3 Encounters:  08/13/22 55.9 kg  12/24/21 72 kg  09/05/21 72.6 kg     Unresulted Labs (From admission, onward)    None     Data Reviewed: I have personally reviewed following labs and imaging studies CBC:No results for input(s): "WBC", "NEUTROABS", "HGB", "HCT", "MCV", "PLT" in the last 168 hours. Basic Metabolic Panel: No results for input(s): "NA", "K", "CL", "CO2", "GLUCOSE", "BUN", "CREATININE", "CALCIUM", "MG", "PHOS" in the last 168 hours. No results found for this or any previous visit (from the past 240 hour(s)).   Antimicrobials: Anti-infectives (From admission, onward)    None      Culture/Microbiology    Component Value Date/Time   SDES BLOOD LEFT HAND 01/12/2022 0828   SDES BLOOD RIGHT HAND 01/12/2022 0828   SPECREQUEST  01/12/2022 0828    BOTTLES DRAWN AEROBIC ONLY Blood Culture results may not be optimal due to an inadequate volume of blood received in culture bottles   SPECREQUEST  01/12/2022 0828    BOTTLES DRAWN AEROBIC ONLY Blood Culture results may not be optimal due to an inadequate volume of blood received in culture bottles   CULT  01/12/2022 0828    NO GROWTH 5 DAYS Performed at [facility], [address] [place], [place] [address]    CULT  01/12/2022 [phone]    NO GROWTH 5 DAYS Performed at [facility], [address], [place], [place] [address]    REPTSTATUS 01/17/2022 FINAL 01/12/2022 1914   REPTSTATUS 01/17/2022 FINAL 01/12/2022 7829  Radiology Studies: No results found.   LOS: 232 days   Lanae Boast, MD [facility]  08/15/2022, 8:24 AM

## 2022-08-16 NOTE — TOC Progression Note (Signed)
Transition of Care [facility]) - Progression Note    Patient Details  Name: Natalie Brown MRN: [id number] Date of Birth: 02/19/1966  Transition of Care [facility]) CM/SW Contact  Inis Sizer, LCSW Phone Number: 08/16/2022, 2:29 PM  Clinical Narrative:    CSW spoke with [place] at [facility] who states there are no beds available in either memory care unit as of right now. [place] to notify CSW once a bed becomes available.    Barriers to Discharge: Financial Resources, Inadequate or no insurance, Homeless with medical needs  Expected Discharge Plan and Services In-house Referral: Clinical Social Work   Post Acute Care Choice: Nursing Home Living arrangements for the past 2 months: Homeless [facility]) Expected Discharge Date: 12/24/21                    Social Determinants of Health (SDOH) Interventions SDOH Screenings   Food Insecurity: No Food Insecurity (03/05/2022)  Housing: High Risk (03/21/2022)  Alcohol Screen: Low Risk  (09/06/2021)  Depression (PHQ2-9): Medium Risk (05/26/2020)  Tobacco Use: High Risk (03/21/2022)    Readmission Risk Interventions     No data to display

## 2022-08-16 NOTE — Progress Notes (Signed)
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  Psychiatry and Neurology evaluated patient during her stay.  Psychiatry patient does not meet criteria for inpatient behavioral health and advised memory care unit. Patient is medically stable for discharge and is awaiting placement at a memory care facility.  Significant events: 09/07/2021: admitted to behavioral health treated with Zyprexa Invega and Ativan challenge for catatonia. 12/24/2021: sent to ER from [facility] for evaluation for tachycardia 9/04-9/5, 9/5>9/9, LTM EEG  no seizure. TSH stable CTA chest no PE no pneumonia RPR nonreactive bilateral LE Doppler negative for DVT [place] with normal EF  9/23 febrile secondary to Covid 19 + no pneumonia. 9/25 repeat CT brain unremarkable. 06/24/2022  psychiatry consulted for increasing agitation    Subjective: Patient seen and examined.   [name] playing Copywriter, advertising, sitter at the bedside getting ready for breakfast  Past 24 hours afebrile, BP stable  Assessment and Plan: Principal Problem:   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) Active Problems:   Delirium due to multiple etiologies, acute, hyperactive   Bipolar affective disorder, current episode manic with psychotic symptoms (HCC)   Sinus tachycardia   Tobacco abuse   Altered mental state   B12 deficiency   COVID-19 virus infection   Drooling   Essential hypertension  Acute metabolic encephalopathy on chronic progressive dementia: Mental status unchanged, she remains confused, nonverbal, not interactive, but calm at times busy coloring her books or playing games on the phone. Seen by neurology and psychiatry-underwent  extensive workup including MRI brain, EEG unrevealing, ANA negative. Cont on Haldol, as needed Zyprexa, hydroxyzine and lorazepam, mirtazapine PER PSYCH> at times she misses her medication due to being in sleep. Continue delirium precaution fall precaution and safety sitter which at times not available due to staffing issues-charge nurse aware and I had discussed with then multiple times   Unintentional weight loss, w/ inadequate oral intake:  cont Remeron dysphagia 3 diet and encourage oral intake   Bipolar disorder: Cont on Haldol 2 mg BID, olanzapine as needed, Atarax as needed.Ativan 0.5 mg p.o. q 6 hr prn for anxiety/agitation.    Vitamin B12 deficiency: Resolved last B12 1195 on 12/11.    Essential hypertension:BP stable-at times soft.  Monitor   CKD stage IIIa:Creatinine remains stable at 1-1.3 range, encourage oral hydration, trend as below intermittently Recent Labs    06/06/22 0943 06/16/22 0459 06/24/22 0836 06/26/22 0816 06/28/22 0956 07/04/22 0801 08/02/22 1852 08/03/22 1717 08/04/22 1553 08/07/22 0940  BUN 18 18 19 12 12 16 17 15 14 15   CREATININE 1.12* 1.17* 1.29* 1.23* 1.13* 1.18* 1.19* 1.07* 1.31* 1.14*     Nutrition Problem: Altered GI function Etiology: constipation Signs/Symptoms: other (comment) (last BM 10 days ago) Interventions: Other (Comment) (scheduled bowel regimen per MD)    DVT prophylaxis: enoxaparin (LOVENOX) injection 40 mg Start: 12/26/21 1200 Code Status:   Code Status: Full Code Family Communication: plan of care discussed with patient at bedside. Patient status is: Inpatient because of unsafe disposition Level of care: Med-Surg   Dispo: The patient is from: Behavioral health            Anticipated disposition: Awaiting Memory care Objective: Vitals last 24 hrs: Vitals:   08/15/22 1559  08/15/22 2106 08/15/22 2106 08/15/22 2109  BP: 126/79 (!) 143/101 (!) 143/101   Pulse: 100  (!) 120 95  Resp: 18  14   Temp:   (!) 97.4 F (36.3 C)  98 F (36.7 C)  TempSrc:   Oral   SpO2: 100%  100% 100%  Weight:      Height:       Weight change:   Physical Examination: General exam: AA, calm, non verbal HEENT:Oral mucosa moist, Ear/Nose WNL grossly, dentition normal. Respiratory system: bilaterally clear BS, no use of accessory muscle Cardiovascular system: S1 & S2 +, regular rate,. Gastrointestinal system: Abdomen soft, NT,ND,BS+ Nervous System:Alert, awake, moving extremities and nonfocal Extremities: LE ankle edema neg, lower extremities warm Skin: No rashes,no icterus. MSK: Normal muscle bulk,tone, power    Medications reviewed:  Scheduled Meds:  vitamin B-12  500 mcg Oral Daily   enoxaparin (LOVENOX) injection  40 mg Subcutaneous Q24H   haloperidol  2 mg Oral BID   hydrocerin   Topical BID   mirtazapine  15 mg Oral QHS   senna-docusate  2 tablet Oral QHS  Continuous Infusions:  Diet Order             DIET DYS 3 Room service appropriate? Yes; Fluid consistency: Thin  Diet effective now                 Nutrition Problem: Altered GI function Etiology: constipation Signs/Symptoms: other (comment) (last BM 10 days ago) Interventions: Other (Comment) (scheduled bowel regimen per MD)  Intake/Output Summary (Last 24 hours) at 08/16/2022 0817 Last data filed at 08/15/2022 1147 Gross per 24 hour  Intake 240 ml  Output --  Net 240 ml   Net IO Since Admission: 57,222.29 mL [08/16/22 0817]  Wt Readings from Last 3 Encounters:  08/13/22 55.9 kg  12/24/21 72 kg  09/05/21 72.6 kg     Unresulted Labs (From admission, onward)    None     Data Reviewed: I have personally reviewed following labs and imaging studies CBC:No results for input(s): "WBC", "NEUTROABS", "HGB", "HCT", "MCV", "PLT" in the last 168 hours. Basic Metabolic Panel: No results for input(s): "NA", "K", "CL", "CO2", "GLUCOSE", "BUN", "CREATININE", "CALCIUM", "MG", "PHOS" in the last 168 hours. No results found for this or any previous visit  (from the past 240 hour(s)).  Antimicrobials: Anti-infectives (From admission, onward)    None      Culture/Microbiology    Component Value Date/Time   SDES BLOOD LEFT HAND 01/12/2022 0828   SDES BLOOD RIGHT HAND 01/12/2022 0828   SPECREQUEST  01/12/2022 0828    BOTTLES DRAWN AEROBIC ONLY Blood Culture results may not be optimal due to an inadequate volume of blood received in culture bottles   SPECREQUEST  01/12/2022 0828    BOTTLES DRAWN AEROBIC ONLY Blood Culture results may not be optimal due to an inadequate volume of blood received in culture bottles   CULT  01/12/2022 0828    NO GROWTH 5 DAYS Performed at [facility], [address] [place], [place] [address]    CULT  01/12/2022 [phone]    NO GROWTH 5 DAYS Performed at [facility], [address], [place], [place] [address]    REPTSTATUS 01/17/2022 FINAL 01/12/2022 1914   REPTSTATUS 01/17/2022 FINAL 01/12/2022 7829  Radiology Studies: No results found.   LOS: 233 days   Lanae Boast, MD [facility]  08/16/2022, 8:17 AM

## 2022-08-17 DIAGNOSIS — K117 Disturbances of salivary secretion: Secondary | ICD-10-CM

## 2022-08-17 DIAGNOSIS — E538 Deficiency of other specified B group vitamins: Secondary | ICD-10-CM

## 2022-08-17 DIAGNOSIS — F05 Delirium due to known physiological condition: Secondary | ICD-10-CM

## 2022-08-17 DIAGNOSIS — Z720 Tobacco use: Secondary | ICD-10-CM

## 2022-08-17 DIAGNOSIS — U071 COVID-19: Secondary | ICD-10-CM

## 2022-08-17 DIAGNOSIS — R000 Tachycardia, unspecified: Secondary | ICD-10-CM

## 2022-08-17 DIAGNOSIS — I10 Essential (primary) hypertension: Secondary | ICD-10-CM

## 2022-08-17 LAB — CBC
HCT: 41 % (ref 36.0–46.0)
Hemoglobin: 13.6 g/dL (ref 12.0–15.0)
MCH: 29.1 pg (ref 26.0–34.0)
MCHC: 33.2 g/dL (ref 30.0–36.0)
MCV: 87.6 fL (ref 80.0–100.0)
Platelets: 275 10*3/uL (ref 150–400)
RBC: 4.68 MIL/uL (ref 3.87–5.11)
RDW: 13.4 % (ref 11.5–15.5)
WBC: 6.6 10*3/uL (ref 4.0–10.5)
nRBC: 0 % (ref 0.0–0.2)

## 2022-08-17 NOTE — Progress Notes (Signed)
PROGRESS NOTE Natalie DAWN  ZOX:[id number] DOB: [birth year] DOA: 12/24/2021 PCP: Lavinia Sharps, NP  Brief Narrative/Hospital Course:  Ms. Brown is a 57 yo female with a PMH of Bipolar Affective Disorder and Chronic Dementia with Advanced Cerebral Atrophy and Behavioral Disturbances was admitted on 09/07/21 with acute mania and aggressive behavior.  Psychiatry and Neurology evaluated patient during her stay.  Psychiatry patient does not meet criteria for inpatient behavioral health and advised memory care unit. Patient is medically stable for discharge and is awaiting placement at a memory care facility.   Significant events:  09/07/2021: admitted to behavioral health treated with Zyprexa Invega and Ativan challenge for catatonia. 12/24/2021: sent to ER from [facility] for evaluation for tachycardia 9/04-9/5, 9/5>9/9, LTM EEG  no seizure. TSH stable CTA chest no PE no pneumonia RPR nonreactive bilateral LE Doppler negative for DVT [place] with normal EF  9/23 febrile secondary to Covid 19 + no pneumonia. 9/25 repeat CT brain unremarkable. 06/24/2022  psychiatry consulted for increasing agitation     Assessment and Plan: Principal Problem:   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) Active Problems:   Delirium due to multiple etiologies, acute, hyperactive   Bipolar affective disorder, current episode manic with psychotic symptoms (HCC)   Sinus tachycardia   Tobacco abuse   Altered mental state   B12 deficiency   COVID-19 virus infection   Drooling   Essential hypertension  Acute metabolic encephalopathy on chronic progressive dementia: Patient continues to remain confused nonverbal noninteractive.  Seen by neurology and psychiatry during hospitalization and underwent extensive workup including MRI EEG which was unremarkable.  On supportive care including  Haldol, as needed Zyprexa, hydroxyzine and lorazepam, mirtazapine. Continue delirium precautions, fall precaution and Retail buyer.   Unintentional weight loss, with inadequate oral intake: Continue Remeron, dysphagia 3 diet and encourage oral intake   Bipolar disorder:  Continue on Haldol 2 mg BID, olanzapine, Atarax and.Ativan as needed.  Vitamin B12 deficiency: Resolved, last B12 1195 on 12/11.    Essential hypertension:BP stable this time.  Last blood pressure 110/60 with   CKD stage IIIa:Creatinine remains stable at 1-1.3 range, encourage oral hydration, will check a BMP in AM.    DVT prophylaxis: enoxaparin (LOVENOX) injection 40 mg Start: 12/26/21 1200  Code Status:   Code Status: Full Code  Family Communication: No one at bedside.  Patient status is: Inpatient because of unsafe disposition  Level of care: Med-Surg   Dispo: The patient is from: Behavioral health            Anticipated disposition: Awaiting Memory care  Subjective: Today: patient was seen and examined at bedside.  Sitter at bedside.  Did not communicate with me.  Objective:  Vitals last 24 hrs: Vitals:   08/15/22 2106 08/15/22 2109 08/16/22 2305 08/17/22 0623  BP: (!) 143/101  102/65 110/61  Pulse: (!) 120 95 (!) 104 (!) 107  Resp: 14  15   Temp: (!) 97.4 F (36.3 C) 98 F (36.7 C) 97.9 F (36.6 C) 98.6 F (37 C)  TempSrc: Oral  Oral Oral  SpO2: 100% 100% 100% 100%  Weight:      Height:        Physical Examination: Body mass index is 19.89 kg/m.  General:  Average built, not in obvious distress, nonverbal, appears to be calm at the time of my exam. HENT:   No scleral pallor or icterus noted. Oral mucosa is moist.  Chest:  Clear breath sounds.  Diminished breath  sounds bilaterally. No crackles or wheezes.  CVS: S1 &S2 heard. No murmur.  Regular rate and rhythm. Abdomen: Soft, nontender, nondistended.  Bowel sounds are heard.   Extremities: No cyanosis, clubbing or edema.  Peripheral pulses are palpable. Psych: Alert, awake nonverbal, moves extremities, CNS:  No cranial nerve deficits.  Moves  extremities Skin: Warm and dry.  No rashes noted.    Data Reviewed: I have personally reviewed following labs and imaging studies CBC:No results for input(s): "WBC", "NEUTROABS", "HGB", "HCT", "MCV", "PLT" in the last 168 hours. Basic Metabolic Panel: No results for input(s): "NA", "K", "CL", "CO2", "GLUCOSE", "BUN", "CREATININE", "CALCIUM", "MG", "PHOS" in the last 168 hours. No results found for this or any previous visit (from the past 240 hour(s)).  Antimicrobials: Anti-infectives (From admission, onward)    None      Culture/Microbiology    Component Value Date/Time   SDES BLOOD LEFT HAND 01/12/2022 0828   SDES BLOOD RIGHT HAND 01/12/2022 0828   SPECREQUEST  01/12/2022 0828    BOTTLES DRAWN AEROBIC ONLY Blood Culture results may not be optimal due to an inadequate volume of blood received in culture bottles   SPECREQUEST  01/12/2022 0828    BOTTLES DRAWN AEROBIC ONLY Blood Culture results may not be optimal due to an inadequate volume of blood received in culture bottles   CULT  01/12/2022 0828    NO GROWTH 5 DAYS Performed at [facility], [address], [place], [place] [address]    CULT  01/12/2022 [phone]    NO GROWTH 5 DAYS Performed at [facility], [address] [place], [place] [address]    REPTSTATUS 01/17/2022 FINAL 01/12/2022 1914   REPTSTATUS 01/17/2022 FINAL 01/12/2022 7829  Radiology Studies: No results found.   LOS: 234 days   Joycelyn Das, MD [facility] 08/17/2022, 1:19 PM

## 2022-08-18 NOTE — Progress Notes (Signed)
PROGRESS NOTE Natalie Brown  ZOX:[id number] DOB: November 18, 1965 DOA: 12/24/2021 PCP: Natalie Sharps, NP  Brief Narrative/Hospital Course:  [name] is a 57 yo female with a PMH of Bipolar Affective Disorder and Chronic Dementia with Advanced Cerebral Atrophy and Behavioral Disturbances was admitted on 09/07/21 with acute mania and aggressive behavior.  Psychiatry and Neurology evaluated patient during her stay.  Psychiatry patient does not meet criteria for inpatient behavioral health and advised memory care unit. Patient is medically stable for discharge and is awaiting placement at a memory care facility.   Significant events:  09/07/2021: admitted to behavioral health treated with Zyprexa Invega and Ativan challenge for catatonia. 12/24/2021: sent to ER from [facility] for evaluation for tachycardia 9/04-9/5, 9/5>9/9, LTM EEG  no seizure. TSH stable CTA chest no PE no pneumonia RPR nonreactive bilateral LE Doppler negative for DVT Echo with normal EF  9/23 febrile secondary to Covid 19 + no pneumonia. 9/25 repeat CT brain unremarkable. 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  Seen by neurology and psychiatry during hospitalization and underwent extensive workup including MRI EEG which was unremarkable.  On supportive care including  Haldol, as needed Zyprexa, hydroxyzine and lorazepam, mirtazapine. Continue delirium precautions, fall precaution and Retail buyer.   Unintentional weight loss, with inadequate oral intake: Continue Remeron, dysphagia 3 diet and encourage oral intake   Bipolar disorder:  Continue on Haldol 2 mg BID, olanzapine, Atarax and.Ativan as needed.  Vitamin B12 deficiency: Resolved, last B12 1195 on 12/11.    Essential hypertension:BP stable this time.  Last blood pressure 104/92   CKD stage IIIa:Creatinine remains stable at 1-1.3 range, encourage oral hydration check BMP pending.    DVT prophylaxis: enoxaparin (LOVENOX) injection 40 mg Start: 12/26/21 1200  Code Status:   Code Status: Full Code  Family Communication: No one at bedside.  Patient status is: Inpatient because of unsafe disposition  Level of care: Med-Surg   Dispo: The patient is from: Behavioral health             Anticipated disposition: Awaiting Memory care  Subjective: Today, patient was seen and examined at bedside.  Nonverbal.  No sitter at bedside.  Constantly staring on the wall.    Objective:  Vitals last 24 hrs: Vitals:   08/16/22 2305 08/17/22 0623 08/17/22 2000 08/18/22 0922  BP: 102/65 110/61 (!) 89/63 (!) 104/92  Pulse: (!) 104 (!) 107 94 94  Resp: 15     Temp: 97.9 F (36.6 C) 98.6 F (37 C)    TempSrc: Oral Oral    SpO2: 100% 100% 100% 100%  Weight:      Height:        Physical Examination: Body mass index is 19.89 kg/m.   General: Thinly built, not in obvious distress, nonverbal, constantly staring on the wall, appears to be calm.   HENT:   No scleral pallor or icterus noted. Oral mucosa is moist.  Chest:  Clear breath sounds. . No crackles or wheezes.  CVS:  S1 &S2 heard. No murmur.  Regular rate and rhythm. Abdomen: Soft, nontender, nondistended.  Bowel sounds are heard.   Extremities: No cyanosis, clubbing or edema.  Psych: Alert, awake nonverbal, spontaneously moves all extremities. CNS:  No cranial nerve deficits.  Moves extremities Skin: Warm and dry.  No rashes noted.    Data Reviewed: I have  personally reviewed following labs and imaging studies CBC: Recent Labs  Lab 08/17/22 1615  WBC 6.6  HGB 13.6  HCT 41.0  MCV 87.6  PLT 275   Basic Metabolic Panel: No results for input(s): "NA", "K", "CL", "CO2", "GLUCOSE", "BUN", "CREATININE", "CALCIUM", "MG", "PHOS" in the last 168 hours. No results found for this or any previous visit (from the past 240 hour(s)).  Antimicrobials: Anti-infectives (From admission, onward)    None      Culture/Microbiology    Component Value Date/Time   SDES BLOOD LEFT HAND 01/12/2022 0828   SDES BLOOD RIGHT HAND 01/12/2022 0828   SPECREQUEST  01/12/2022 0828    BOTTLES DRAWN AEROBIC ONLY Blood Culture results may not be optimal due to an inadequate volume of blood received in culture bottles   SPECREQUEST  01/12/2022 0828    BOTTLES DRAWN AEROBIC ONLY Blood Culture results may not be optimal due to an inadequate volume of blood received in culture bottles   CULT  01/12/2022 0828    NO GROWTH 5 DAYS Performed at [facility] Lab, [address] [place], [place] [address]    CULT  01/12/2022 [phone]    NO GROWTH 5 DAYS Performed at [facility], [address], [place], [place] [address]    REPTSTATUS 01/17/2022 FINAL 01/12/2022 1914   REPTSTATUS 01/17/2022 FINAL 01/12/2022 7829  Radiology Studies: No results found.   LOS: 235 days   Joycelyn Das, MD [facility] 08/18/2022, 10:31 AM

## 2022-08-19 NOTE — Plan of Care (Signed)
Pt remains awake and impulsive throughout shift. Remains non-verbal with staff. VSS throughout shift. Pt refused all meds except haldol.  POC maintained, will continue to monitor.   Problem: Education: Goal: Knowledge of General Education information will improve Description: Including pain rating scale, medication(s)/side effects and non-pharmacologic comfort measures Outcome: Progressing   Problem: Health Behavior/Discharge Planning: Goal: Ability to manage health-related needs will improve Outcome: Progressing   Problem: Clinical Measurements: Goal: Ability to maintain clinical measurements within normal limits will improve Outcome: Progressing Goal: Will remain free from infection Outcome: Progressing Goal: Diagnostic test results will improve Outcome: Progressing Goal: Respiratory complications will improve Outcome: Progressing Goal: Cardiovascular complication will be avoided Outcome: Progressing   Problem: Activity: Goal: Risk for activity intolerance will decrease Outcome: Progressing   Problem: Nutrition: Goal: Adequate nutrition will be maintained Outcome: Progressing   Problem: Coping: Goal: Level of anxiety will decrease Outcome: Progressing   Problem: Elimination: Goal: Will not experience complications related to bowel motility Outcome: Progressing Goal: Will not experience complications related to urinary retention Outcome: Progressing   Problem: Pain Managment: Goal: General experience of comfort will improve Outcome: Progressing   Problem: Safety: Goal: Ability to remain free from injury will improve Outcome: Progressing   Problem: Skin Integrity: Goal: Risk for impaired skin integrity will decrease Outcome: Progressing

## 2022-08-19 NOTE — Progress Notes (Signed)
Nutrition Follow-up  DOCUMENTATION CODES:   Not applicable  INTERVENTION:  - Continue scheduled bowel regimen.   NUTRITION DIAGNOSIS:   Altered GI function related to constipation as evidenced by other (comment) (last BM 10 days ago). - Ongoing   GOAL:   Other (Comment) (more frequent bowel movements) - Ongoing   MONITOR:   I & O's  REASON FOR ASSESSMENT:   LOS    ASSESSMENT:   57 y.o. female admits related to AMS. PMH includes: Bipolar disorde rand HTN. Pt is currently receiving medical management related to early onset dementia with behavioral disturbances.  Meds reviewed:  Vit B12, remeron, senokot. Labs reviewed: WDL.   Pt continues with good PO intakes. Per record, pt has eaten mostly 55-100% of her meals over the past 7 days. Pt is meeting her needs at this time. Per record, pt has experienced a 8.5% wt loss over the past month. RD will continue to closely monitor PO intakes.   Diet Order:   Diet Order             DIET DYS 3 Room service appropriate? Yes; Fluid consistency: Thin  Diet effective now                   EDUCATION NEEDS:   Not appropriate for education at this time  Skin:  Skin Assessment: Reviewed RN Assessment  Last BM:  4/26 - type 1  Height:   Ht Readings from Last 1 Encounters:  02/03/22 5\' 6"  (1.676 m)    Weight:   Wt Readings from Last 1 Encounters:  08/13/22 55.9 kg    Ideal Body Weight:     BMI:  Body mass index is 19.89 kg/m.  Estimated Nutritional Needs:   Kcal:  1800-2160 kcals  Protein:  90-105 gm  Fluid:  >/= 1.8 L  Bethann Humble, RD, LDN, CNSC.

## 2022-08-19 NOTE — Progress Notes (Signed)
PROGRESS NOTE Natalie Brown  ZOX:[id number] DOB: Jun 20, 1965 DOA: 12/24/2021 PCP: Lavinia Sharps, NP  Brief Narrative/Hospital Course:  Ms. 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  Psychiatry and Neurology evaluated patient during her stay.  Psychiatry patient does not meet criteria for inpatient behavioral health and advised memory care unit. Patient is medically stable for discharge and is awaiting placement at a memory care facility.   Significant events:  09/07/2021: admitted to behavioral health treated with Zyprexa Invega and Ativan challenge for catatonia. 12/24/2021: sent to ER from [facility] for evaluation for tachycardia 9/04-9/5, 9/5>9/9, LTM EEG  no seizure. TSH stable CTA chest no PE no pneumonia RPR nonreactive bilateral LE Doppler negative for DVT Echo with normal EF  9/23 febrile secondary to Covid 19 + no pneumonia. 9/25 repeat CT brain unremarkable. 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  Seen by neurology and psychiatry during hospitalization and underwent extensive workup including MRI EEG which was unremarkable.  On supportive care including  Haldol, as needed Zyprexa, hydroxyzine and lorazepam, mirtazapine. Continue delirium precautions, fall precaution and Retail buyer.   Unintentional weight loss, with inadequate oral intake: Continue Remeron, dysphagia 3 diet and encourage oral intake   Bipolar disorder:  Continue on Haldol 2 mg BID, olanzapine, Atarax and.Ativan as needed.  Vitamin B12 deficiency: Resolved, last B12 1195 on 12/11.    Essential hypertension:BP stable this time.  Last blood pressure seems to be elevated but other blood pressures were within normal range.  Will continue to monitor.   CKD stage IIIa:Creatinine remains stable at 1-1.3 range, encourage oral hydration check BMP pending.    DVT prophylaxis: enoxaparin (LOVENOX) injection 40 mg Start: 12/26/21 1200  Code Status:   Code Status: Full Code  Family Communication: No one at bedside.  Sitter at bedside.  Patient status is: Inpatient because of unsafe disposition  Level of care: Med-Surg   Dispo: The patient is from: Behavioral health             Anticipated disposition: Awaiting Memory care  Subjective: Today, patient was seen and examined at bedside.  Nonverbal.  No interval complaints reported by the sitter at bedside.    Objective:  Vitals last 24 hrs: Vitals:   08/18/22 1653 08/18/22 2021 08/19/22 0505 08/19/22 0658  BP: 95/70 98/62 112/85 (!) 154/140  Pulse: 62 60 (!) 110   Resp: 16 14 14 18   Temp: (!) 96.9 F (36.1 C) 97.8 F (36.6 C) 97.8 F (36.6 C) 98.5 F (36.9 C)  TempSrc: Axillary Axillary Axillary   SpO2: 100% 95% 95% 96%  Weight:      Height:        Physical Examination: Body mass index is 19.89 kg/m.   General: Thinly built, not in obvious distress, nonverbal, noninteractive. HENT:   No scleral pallor or icterus noted. Oral mucosa is  moist.  Chest:  Clear breath sounds. . No crackles or wheezes.  CVS: S1 &S2 heard. No murmur.  Regular rate and rhythm. Abdomen: Soft, nontender, nondistended.  Bowel sounds are heard.   Extremities: No cyanosis, clubbing or edema.  Psych: Alert, awake nonverbal, spontaneously moves all extremities.   Noninteractive.  Underlying dementia. CNS:  No cranial nerve deficits.  Moves extremities Skin: Warm and dry.  No rashes noted.    Data Reviewed: I have personally reviewed following labs and imaging studies CBC: Recent Labs  Lab 08/17/22 1615  WBC 6.6  HGB 13.6  HCT 41.0  MCV 87.6  PLT 275    Basic Metabolic Panel: No results for input(s): "NA", "K", "CL", "CO2", "GLUCOSE", "BUN", "CREATININE", "CALCIUM", "MG", "PHOS" in the last 168 hours. No results found for this or any previous visit (from the past 240 hour(s)).  Antimicrobials: Anti-infectives (From admission, onward)    None      Culture/Microbiology    Component Value Date/Time   SDES BLOOD LEFT HAND 01/12/2022 0828   SDES BLOOD RIGHT HAND 01/12/2022 0828   SPECREQUEST  01/12/2022 0828    BOTTLES DRAWN AEROBIC ONLY Blood Culture results may not be optimal due to an inadequate volume of blood received in culture bottles   SPECREQUEST  01/12/2022 0828    BOTTLES DRAWN AEROBIC ONLY Blood Culture results may not be optimal due to an inadequate volume of blood received in culture bottles   CULT  01/12/2022 0828    NO GROWTH 5 DAYS Performed at [facility], [address], [place], [place] [address]    CULT  01/12/2022 [phone]    NO GROWTH 5 DAYS Performed at [facility], [address], [place], [place] [address]    REPTSTATUS 01/17/2022 FINAL 01/12/2022 8469   REPTSTATUS 01/17/2022 FINAL 01/12/2022 6295  Radiology Studies: No results found.   LOS: 236 days   Joycelyn Das, MD [facility] 08/19/2022, 11:51 AM

## 2022-08-20 NOTE — TOC Progression Note (Signed)
Transition of Care [facility]) - Progression Note    Patient Details  Name: Natalie Brown MRN: [id number] Date of Birth: Feb 07, 1966  Transition of Care [facility]) CM/SW Contact  Inis Sizer, LCSW Phone Number: 08/20/2022, 2:11 PM  Clinical Narrative:    CSW spoke with [place] at [facility] who states there are no beds available yet in the memory care unit at either facility. [place] to notify CSW once a bed becomes available.    Barriers to Discharge: Financial Resources, Inadequate or no insurance, Homeless with medical needs  Expected Discharge Plan and Services In-house Referral: Clinical Social Work   Post Acute Care Choice: Nursing Home Living arrangements for the past 2 months: Homeless [facility]) Expected Discharge Date: 12/24/21                                     Social Determinants of Health (SDOH) Interventions SDOH Screenings   Food Insecurity: No Food Insecurity (03/05/2022)  Housing: High Risk (03/21/2022)  Alcohol Screen: Low Risk  (09/06/2021)  Depression (PHQ2-9): Medium Risk (05/26/2020)  Tobacco Use: High Risk (03/21/2022)    Readmission Risk Interventions     No data to display

## 2022-08-20 NOTE — Progress Notes (Signed)
PROGRESS NOTE Natalie Brown  ZOX:[id number] DOB: 1965-07-21 DOA: 12/24/2021 PCP: Lavinia Sharps, NP   Brief Narrative/Hospital Course:  Ms. Lair is a 57 yo female with a PMH of Bipolar Affective Disorder and Chronic Dementia with Advanced Cerebral Atrophy and Behavioral Disturbances was admitted on 09/07/21 with acute mania and aggressive behavior.  Psychiatry and Neurology evaluated patient during her stay.  Psychiatry patient does not meet criteria for inpatient behavioral health and advised memory care unit. Patient is medically stable for discharge and is awaiting placement at a memory care facility.   Significant events:  09/07/2021: admitted to behavioral health treated with Zyprexa Invega and Ativan challenge for catatonia. 12/24/2021: sent to ER from [facility] for evaluation for tachycardia 9/04-9/5, 9/5>9/9, LTM EEG  no seizure. TSH stable CTA chest no PE no pneumonia RPR nonreactive bilateral LE Doppler negative for DVT Echo with normal EF  9/23 febrile secondary to Covid 19 + no pneumonia. 9/25 repeat CT brain unremarkable. 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  Seen by neurology and psychiatry during hospitalization and underwent extensive workup including MRI EEG which was unremarkable.  On supportive care including  Haldol, as needed Zyprexa, hydroxyzine and lorazepam, mirtazapine. Continue delirium precautions, fall precaution and Retail buyer.   Unintentional weight loss, with inadequate oral intake: Continue Remeron, dysphagia 3 diet   Bipolar disorder:  Continue on Haldol 2 mg BID, olanzapine, Atarax and.Ativan as needed.  Vitamin B12 deficiency: Resolved, last B12 1195 on 12/11.    Essential hypertension: BP stable this time.  Last blood pressure seems to be elevated but other blood pressures were within normal range.  Will continue to monitor.   CKD stage IIIa: Creatinine remains stable at 1-1.3 range, encourage oral hydration     DVT prophylaxis: enoxaparin (LOVENOX) injection 40 mg Start: 12/26/21 1200  Code Status:   Code Status: Full Code  Family Communication: No one at bedside.  Sitter at bedside.  Patient status is: Inpatient because of unsafe disposition  Level of care: Med-Surg   Dispo: The patient is from: Behavioral health             Anticipated disposition: Awaiting Memory care  Subjective: Today, patient was seen and examined at bedside.  Nonverbal.  No interval complaints reported.    Objective:  Vitals last 24 hrs: Vitals:   08/19/22 0505 08/19/22 0658 08/19/22 2300 08/20/22 0740  BP: 112/85 (!) 154/140 116/68 (!) 84/50  Pulse: (!) 110  95 62  Resp: 14 18 18 16   Temp: 97.8 F (36.6 C) 98.5 F (36.9 C) 97.9 F (36.6 C) (!) 97.3 F (36.3 C)  TempSrc: Axillary  Oral Axillary  SpO2: 95% 96% 100% 94%  Weight:      Height:        Physical Examination:  Body mass index is 19.89 kg/m.   General: Thinly built, not in obvious distress, nonverbal, noninteractive. HENT:   No scleral pallor or icterus noted. Oral mucosa is moist.  Chest:  Clear breath  sounds. . No crackles or wheezes.  CVS: S1 &S2 heard. No murmur.  Regular rate and rhythm. Abdomen: Soft, nontender, nondistended.  Bowel sounds are heard.   Extremities: No cyanosis, clubbing or edema.  Psych: Alert, awake nonverbal, spontaneously moves all extremities.  Noninteractive.  Underlying dementia. CNS:  No cranial nerve  deficits.  Moves extremities Skin: Warm and dry.  No rashes noted.    Data Reviewed: I have personally reviewed following labs and imaging studies CBC: Recent Labs  Lab 08/17/22 1615  WBC 6.6  HGB 13.6  HCT 41.0  MCV 87.6  PLT 275    Basic Metabolic Panel: No results for input(s): "NA", "K", "CL", "CO2", "GLUCOSE", "BUN", "CREATININE", "CALCIUM", "MG", "PHOS" in the last 168 hours. No results found for this or any previous visit (from the past 240 hour(s)).  Antimicrobials: Anti-infectives (From admission, onward)    None      Culture/Microbiology    Component Value Date/Time   SDES BLOOD LEFT HAND 01/12/2022 0828   SDES BLOOD RIGHT HAND 01/12/2022 0828   SPECREQUEST  01/12/2022 0828    BOTTLES DRAWN AEROBIC ONLY Blood Culture results may not be optimal due to an inadequate volume of blood received in culture bottles   SPECREQUEST  01/12/2022 0828    BOTTLES DRAWN AEROBIC ONLY Blood Culture results may not be optimal due to an inadequate volume of blood received in culture bottles   CULT  01/12/2022 0828    NO GROWTH 5 DAYS Performed at [facility], [address], [place], [place] [address]    CULT  01/12/2022 [phone]    NO GROWTH 5 DAYS Performed at [facility], [address], [place], [place] [address]    REPTSTATUS 01/17/2022 FINAL 01/12/2022 1914   REPTSTATUS 01/17/2022 FINAL 01/12/2022 7829  Radiology Studies: No results found.   LOS: 237 days   Joycelyn Das, MD [facility] 08/20/2022, 1:36 PM

## 2022-08-21 LAB — BASIC METABOLIC PANEL
Anion gap: 8 (ref 5–15)
BUN: 10 mg/dL (ref 6–20)
CO2: 24 mmol/L (ref 22–32)
Calcium: 8.9 mg/dL (ref 8.9–10.3)
Chloride: 107 mmol/L (ref 98–111)
Creatinine, Ser: 1.24 mg/dL — ABNORMAL HIGH (ref 0.44–1.00)
GFR, Estimated: 51 mL/min — ABNORMAL LOW (ref >60)
Glucose, Bld: 115 mg/dL — ABNORMAL HIGH (ref 70–99)
Potassium: 4.7 mmol/L (ref 3.5–5.1)
Sodium: 139 mmol/L (ref 135–145)

## 2022-08-21 NOTE — TOC Progression Note (Signed)
Transition of Care [facility]) - Progression Note    Patient Details  Name: Natalie Brown MRN: [id number] Date of Birth: 04-11-1966  Transition of Care [facility]) CM/SW Contact  Inis Sizer, LCSW Phone Number: 08/21/2022, 8:05 AM  Clinical Narrative:    CSW spoke with patient's mother who is requesting CSW reach out to [facility] to determine if they can accept the patient.  CSW spoke with Trey Paula at [facility] who states there are no Medicaid beds available at this time.  CSW spoke with Sue Lush at [facility] who states the facility does not accept Medicaid for memory care.  CSW attempted to reach [place] at [facility] without success - a voicemail was left requesting a return call.  CSW spoke with Diplomatic Services operational officer at [facility] to request admissions return call.  Barriers to Discharge: Financial Resources, Inadequate or no insurance, Homeless with medical needs  Expected Discharge Plan and Services In-house Referral: Clinical Social Work   Post Acute Care Choice: Nursing Home Living arrangements for the past 2 months: Homeless [facility]) Expected Discharge Date: 12/24/21                   Social Determinants of Health (SDOH) Interventions SDOH Screenings   Food Insecurity: No Food Insecurity (03/05/2022)  Housing: High Risk (03/21/2022)  Alcohol Screen: Low Risk  (09/06/2021)  Depression (PHQ2-9): Medium Risk (05/26/2020)  Tobacco Use: High Risk (03/21/2022)    Readmission Risk Interventions     No data to display

## 2022-08-21 NOTE — Progress Notes (Signed)
PROGRESS NOTE Natalie Brown  ZOX:[id number] DOB: 1965/07/05 DOA: 12/24/2021 PCP: Natalie Sharps, NP   Brief Narrative/Hospital Course:  [name] is a 57 yo female with a PMH of Bipolar Affective Disorder and Chronic Dementia with Advanced Cerebral Atrophy and Behavioral Disturbances was admitted on 09/07/21 with acute mania and aggressive behavior.  Psychiatry and Neurology evaluated patient during her stay.  Psychiatry patient does not meet criteria for inpatient behavioral health and advised memory care unit. Patient is medically stable for discharge and is awaiting placement at a memory care facility.   Significant events:  09/07/2021: admitted to behavioral health treated with Zyprexa Invega and Ativan challenge for catatonia. 12/24/2021: sent to ER from [facility] for evaluation for tachycardia 9/04-9/5, 9/5>9/9, LTM EEG  no seizure. TSH stable CTA chest no PE no pneumonia RPR nonreactive bilateral LE Doppler negative for DVT Echo with normal EF  9/23 febrile secondary to Covid 19 + no pneumonia. 9/25 repeat CT brain unremarkable. 06/24/2022  psychiatry consulted for increasing agitation  08/21/22 awaiting for placement.    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  Seen by neurology and psychiatry during hospitalization and underwent extensive workup including MRI EEG which was unremarkable.  On supportive care including  Haldol, as needed Zyprexa, hydroxyzine and lorazepam, mirtazapine. Continue  delirium precautions, fall precaution and Recruitment consultant.   Unintentional weight loss, with inadequate oral intake: Continue Remeron, dysphagia 3 diet   Bipolar disorder:  Continue on Haldol 2 mg BID, olanzapine, Atarax and.Ativan as needed.  Vitamin B12 deficiency: Resolved, last B12 1195 on 12/11.    Essential hypertension: BP stable this time.  Last blood pressure seems to be elevated but other blood pressures were within normal range.  Will continue to monitor.   CKD stage IIIa: Creatinine remains stable at 1-1.3 range, encourage oral hydration     DVT prophylaxis: enoxaparin (LOVENOX) injection 40 mg Start: 12/26/21 1200  Code Status:   Code Status: Full Code  Family Communication: No one at bedside.  Sitter at bedside.  Patient status is: Inpatient because of unsafe disposition  Level of care: Med-Surg   Dispo: The patient is from: Behavioral health             Anticipated disposition: Awaiting Memory care  Subjective: Today, patient was seen and examined at bedside.  No interval complaints reported.  Objective:  Vitals last 24 hrs: Vitals:   08/19/22 0658 08/19/22 2300 08/20/22 0740 08/21/22 0644  BP: (!) 154/140 116/68 (!) 84/50 105/70  Pulse:  95 62 (!) 107  Resp: 18 18 16    Temp: 98.5 F (36.9 C) 97.9 F (36.6 C) (!) 97.3 F (36.3 C) 99.1 F (37.3 C)  TempSrc:  Oral Axillary Oral  SpO2: 96% 100% 94% 100%  Weight:      Height:        Physical Examination:  Body mass index is 19.89 kg/m.   General: Alert awake and nonverbal, thinly built,  HENT:   No scleral pallor or icterus noted. Oral mucosa is moist.  Chest:  Clear  breath sounds. . No crackles or wheezes.  CVS: S1 &S2 heard. No murmur.  Regular rate and rhythm. Abdomen: Soft, nontender, nondistended.  Bowel sounds are heard.   Extremities: No cyanosis, clubbing or edema.  Psych: Underlying dementia, nonverbal, spontaneous extremity movement CNS: Moves extremities spontaneously. Skin: Warm and  dry.  No rashes noted.    Data Reviewed: I have personally reviewed following labs and imaging studies CBC: Recent Labs  Lab 08/17/22 1615  WBC 6.6  HGB 13.6  HCT 41.0  MCV 87.6  PLT 275    Basic Metabolic Panel: Recent Labs  Lab 08/21/22 0500  NA 139  K 4.7  CL 107  CO2 24  GLUCOSE 115*  BUN 10  CREATININE 1.24*  CALCIUM 8.9   No results found for this or any previous visit (from the past 240 hour(s)).  Antimicrobials: Anti-infectives (From admission, onward)    None      Culture/Microbiology    Component Value Date/Time   SDES BLOOD LEFT HAND 01/12/2022 0828   SDES BLOOD RIGHT HAND 01/12/2022 0828   SPECREQUEST  01/12/2022 0828    BOTTLES DRAWN AEROBIC ONLY Blood Culture results may not be optimal due to an inadequate volume of blood received in culture bottles   SPECREQUEST  01/12/2022 0828    BOTTLES DRAWN AEROBIC ONLY Blood Culture results may not be optimal due to an inadequate volume of blood received in culture bottles   CULT  01/12/2022 0828    NO GROWTH 5 DAYS Performed at [facility], [address], [place], [place] [address]    CULT  01/12/2022 [phone]    NO GROWTH 5 DAYS Performed at [facility], [address], [place], [place] [address]    REPTSTATUS 01/17/2022 FINAL 01/12/2022 5784   REPTSTATUS 01/17/2022 FINAL 01/12/2022 6962  Radiology Studies: No results found.   LOS: 238 days   Joycelyn Das, MD [facility] 08/21/2022, 12:43 PM

## 2022-08-22 NOTE — Plan of Care (Signed)

## 2022-08-22 NOTE — Progress Notes (Signed)
PROGRESS NOTE Natalie Brown  [id number]:[id number] DOB: [birth year] DOA: 12/24/2021 PCP: Lavinia Sharps, NP   Brief Narrative/Hospital Course:  Natalie Brown is a 57 yo female with a PMH of Bipolar Affective Disorder and Chronic Dementia with Advanced Cerebral Atrophy and Behavioral Disturbances was admitted on 09/07/21 with acute mania and aggressive behavior.  Psychiatry and Neurology evaluated patient during her stay.  Psychiatry patient does not meet criteria for inpatient behavioral health and advised memory care unit. Patient is medically stable for discharge and is awaiting placement at a memory care facility.   Significant events:  09/07/2021: admitted to behavioral health treated with Zyprexa Invega and Ativan challenge for catatonia. 12/24/2021: sent to ER from [facility] for evaluation for tachycardia 9/04-9/5, 9/5>9/9, LTM EEG  no seizure. TSH stable CTA chest no PE no pneumonia RPR nonreactive bilateral LE Doppler negative for DVT Echo with normal EF  9/23 febrile secondary to Covid 19 + no pneumonia. 9/25 repeat CT brain unremarkable. 06/24/2022  psychiatry consulted for increasing agitation  08/21/22 awaiting for placement.    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  Seen by neurology and psychiatry during hospitalization and underwent extensive workup including MRI EEG which was unremarkable.  On supportive care including  Haldol, as needed Zyprexa, hydroxyzine and lorazepam, mirtazapine. Continue  delirium precautions, fall precaution and Recruitment consultant.   Unintentional weight loss, with inadequate oral intake: Continue Remeron, dysphagia 3 diet   Bipolar disorder:  Continue on Haldol, olanzapine, Atarax and.Ativan as needed.  Vitamin B12 deficiency: Resolved, last B12 1195 on 12/11.    Essential hypertension: Borderline low at this time.   CKD stage IIIa: Creatinine remains stable at 1-1.3 range, encourage oral hydration     DVT prophylaxis: enoxaparin (LOVENOX) injection 40 mg Start: 12/26/21 1200  Code Status:   Code Status: Full Code  Family Communication: No one at bedside.  Sitter at bedside.  Patient status is: Inpatient because of unsafe disposition  Level of care: Med-Surg   Dispo: The patient is from: Behavioral health             Anticipated disposition: Awaiting Memory care  Subjective: Today, patient was seen and examined at bedside.  No interval complaints reported.  Sitter at bedside.  Objective:  Vitals last 24 hrs: Vitals:   08/21/22 0644 08/21/22 1908 08/22/22 0643 08/22/22 0650  BP: 105/70 124/84 95/79   Pulse: (!) 107 (!) 106 (!) 113 100  Resp:  18    Temp: 99.1 F (37.3 C) 99 F (37.2 C) 98.2 F (36.8 C)   TempSrc: Oral Oral Oral   SpO2: 100% 100% 100%   Weight:      Height:        Physical Examination:  Body mass index is 19.89 kg/m.   General: Alert awake but nonverbal, thinly built, HENT:   No scleral pallor or icterus noted. Oral mucosa is moist.  Chest:  Clear breath sounds. . No crackles or wheezes.  CVS: S1 &S2 heard. No murmur.  Regular rate and rhythm. Abdomen: Soft, nontender, nondistended.  Bowel sounds are heard.   Extremities: No cyanosis, clubbing or edema.  Psych: Underlying dementia, nonverbal, spontaneous movement noted. CNS: Alert awake but nonverbal, moves extremities. Skin: Warm and dry.  No rashes noted.    Data Reviewed: I have personally reviewed following labs and imaging studies CBC: Recent Labs   Lab 08/17/22 1615  WBC 6.6  HGB 13.6  HCT 41.0  MCV 87.6  PLT 275    Basic Metabolic Panel: Recent Labs  Lab 08/21/22 0500  NA 139  K 4.7  CL 107  CO2 24  GLUCOSE 115*  BUN 10  CREATININE 1.24*  CALCIUM 8.9   No results found for this or any previous visit (from the past 240 hour(s)).  Antimicrobials: Anti-infectives (From admission, onward)    None      Culture/Microbiology    Component Value Date/Time   SDES BLOOD LEFT HAND 01/12/2022 0828   SDES BLOOD RIGHT HAND 01/12/2022 0828   SPECREQUEST  01/12/2022 0828    BOTTLES DRAWN AEROBIC ONLY Blood Culture results may not be optimal due to an inadequate volume of blood received in culture bottles   SPECREQUEST  01/12/2022 0828    BOTTLES DRAWN AEROBIC ONLY Blood Culture results may not be optimal due to an inadequate volume of blood received in culture bottles   CULT  01/12/2022 0828    NO GROWTH 5 DAYS Performed at [facility], [address] [place], [place] [address]    CULT  01/12/2022 [phone]    NO GROWTH 5 DAYS Performed at [facility], [address] [place], [place] [address]    REPTSTATUS 01/17/2022 FINAL 01/12/2022 9562   REPTSTATUS 01/17/2022 FINAL 01/12/2022 1308  Radiology Studies: No results found.   LOS: 239 days   Joycelyn Das, MD [facility] 08/22/2022, 9:28 AM

## 2022-08-23 DIAGNOSIS — E538 Deficiency of other specified B group vitamins: Secondary | ICD-10-CM

## 2022-08-23 DIAGNOSIS — I10 Essential (primary) hypertension: Secondary | ICD-10-CM

## 2022-08-23 DIAGNOSIS — R000 Tachycardia, unspecified: Secondary | ICD-10-CM

## 2022-08-23 NOTE — Plan of Care (Signed)

## 2022-08-23 NOTE — Progress Notes (Signed)
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  Psychiatry and Neurology evaluated patient during her stay.  Psychiatry patient does not meet criteria for inpatient behavioral health and advised memory care unit. Patient is medically stable for discharge and is awaiting placement at a memory care facility.   Significant events:  09/07/2021: admitted to behavioral health treated with Zyprexa Invega and Ativan challenge for catatonia. 12/24/2021: sent to ER from [facility] for evaluation for tachycardia 9/04-9/5, 9/5>9/9, LTM EEG  no seizure. TSH stable CTA chest no PE no pneumonia RPR nonreactive bilateral LE Doppler negative for DVT Echo with normal EF  9/23 febrile secondary to Covid 19 + no pneumonia. 9/25 repeat CT brain unremarkable. 06/24/2022  psychiatry consulted for increasing agitation  08/21/22 awaiting for placement.    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  Seen by neurology and psychiatry during hospitalization and underwent extensive workup including MRI EEG which was unremarkable.  On supportive care including  Haldol, as needed Zyprexa, hydroxyzine and lorazepam, mirtazapine. Continue  delirium precautions, fall precaution and Recruitment consultant.  Patient to be stable at this time.   Unintentional weight loss, with inadequate oral intake: Continue Remeron, dysphagia 3 diet   Bipolar disorder:  Continue on Haldol, olanzapine, Atarax and.Ativan as needed.  Vitamin B12 deficiency: Resolved, last B12 1195 on 12/11.    Essential hypertension: Borderline low at this time.   CKD stage IIIa: Creatinine remains stable at 1-1.3 range, encourage oral hydration.  Latest creatinine of 1.2 from 08/21/2022    DVT prophylaxis: enoxaparin (LOVENOX) injection 40 mg Start: 12/26/21 1200  Code Status:   Code Status: Full Code  Family Communication: No one at bedside.  Sitter at bedside.  Patient status is: Inpatient because of unsafe disposition  Level of care: Med-Surg   Dispo: The patient is from: Behavioral health             Anticipated disposition: Awaiting Memory care  Subjective: Today, patient was seen and examined.  Nonverbal.  Sitter at bedside.  No interval complaints reported.  Objective:  Vitals last 24 hrs: Vitals:   08/22/22 2050 08/22/22 2100 08/23/22 0030 08/23/22 0635  BP: 96/78  110/68 115/75  Pulse: (!) 51 72 67 92  Resp: 15  17 16   Temp: (!) 96.6 F (35.9 C)  97.8 F (36.6 C) 98.5 F (36.9 C)  TempSrc: Axillary  Oral Oral  SpO2: 100% 100% 100% 100%  Weight:      Height:        Physical Examination:  Body mass index is 19.89 kg/m.   General: Alert awake, nonverbal, thinly built, not in obvious distress HENT:   No scleral pallor or icterus noted. Oral mucosa is moist.  Chest:  Clear breath sounds. No crackles or wheezes.  CVS:  S1 &S2 heard. No murmur.  Regular rate and rhythm. Abdomen: Soft, nontender, nondistended.  Bowel sounds are heard.   Extremities: No cyanosis, clubbing or edema.  Psych: Moves spontaneously, noninteractive, nonverbal, has underlying dementia, CNS: Alert awake but nonverbal, moves extremities. Skin: Warm and dry.  No  rashes noted.    Data Reviewed: I have personally reviewed following labs and imaging studies CBC: Recent Labs  Lab 08/17/22 1615  WBC 6.6  HGB 13.6  HCT 41.0  MCV 87.6  PLT 275    Basic Metabolic Panel: Recent Labs  Lab 08/21/22 0500  NA 139  K 4.7  CL 107  CO2 24  GLUCOSE 115*  BUN 10  CREATININE 1.24*  CALCIUM 8.9   No results found for this or any previous visit (from the past 240 hour(s)).  Antimicrobials: Anti-infectives (From admission, onward)    None      Culture/Microbiology    Component Value Date/Time   SDES BLOOD LEFT HAND 01/12/2022 0828   SDES BLOOD RIGHT HAND 01/12/2022 0828   SPECREQUEST  01/12/2022 0828    BOTTLES DRAWN AEROBIC ONLY Blood Culture results may not be optimal due to an inadequate volume of blood received in culture bottles   SPECREQUEST  01/12/2022 0828    BOTTLES DRAWN AEROBIC ONLY Blood Culture results may not be optimal due to an inadequate volume of blood received in culture bottles   CULT  01/12/2022 0828    NO GROWTH 5 DAYS Performed at [facility], [address], [place], [place] [address]    CULT  01/12/2022 [phone]    NO GROWTH 5 DAYS Performed at [facility], [address], [place], [place] [address]    REPTSTATUS 01/17/2022 FINAL 01/12/2022 1914   REPTSTATUS 01/17/2022 FINAL 01/12/2022 7829  Radiology Studies: No results found.   LOS: 240 days   Joycelyn Das, MD [facility] 08/23/2022, 10:02 AM

## 2022-08-24 NOTE — Progress Notes (Signed)
PROGRESS NOTE Natalie Brown  [id number]:[id number] DOB: 1965/05/07 DOA: 12/24/2021 PCP: Lavinia Sharps, NP   Brief Narrative/Hospital Course:  Ms. 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  Psychiatry and Neurology evaluated patient during her stay.  Psychiatry patient does not meet criteria for inpatient behavioral health and advised memory care unit. Patient is medically stable for discharge and is awaiting placement at a memory care facility.   Significant events:  09/07/2021: admitted to behavioral health treated with Zyprexa Invega and Ativan challenge for catatonia. 12/24/2021: sent to ER from [facility] for evaluation for tachycardia 9/04-9/5, 9/5>9/9, LTM EEG  no seizure. TSH stable CTA chest no PE no pneumonia RPR nonreactive bilateral LE Doppler negative for DVT Echo with normal EF  9/23 febrile secondary to Covid 19 + no pneumonia. 9/25 repeat CT brain unremarkable. 06/24/2022  psychiatry consulted for increasing agitation  08/21/22 awaiting for placement.    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  Seen by neurology and psychiatry during hospitalization and underwent extensive workup including MRI brain, EEG which was unremarkable.  On supportive care including  Haldol, as needed Zyprexa, hydroxyzine and lorazepam, mirtazapine. Continue  delirium precautions, fall precaution and Recruitment consultant.  Patient to be stable at this time.   Unintentional weight loss, with inadequate oral intake: Continue Remeron, dysphagia 3 diet   Bipolar disorder:  Continue on Haldol, olanzapine, Atarax and.Ativan as needed.  Vitamin B12 deficiency: Resolved, last B12 1195 on 12/11.    Essential hypertension: Borderline low at this time.   CKD stage IIIa: Creatinine remains stable at 1-1.3 range, encourage oral hydration.  Latest creatinine of 1.2 from 08/21/2022    DVT prophylaxis: enoxaparin (LOVENOX) injection 40 mg Start: 12/26/21 1200  Code Status:   Code Status: Full Code  Family Communication: No one at bedside.  Sitter at bedside.  Patient status is: Inpatient because of unsafe disposition  Level of care: Med-Surg   Dispo: The patient is from: Behavioral health             Anticipated disposition: Awaiting Memory care  Subjective: Today, patient was seen and examined at bedside.  Nonverbal.  Sitter at bedside.  No interval complaints reported.   Objective:  Vitals last 24 hrs: Vitals:   08/23/22 0635 08/23/22 2126 08/24/22 0417 08/24/22 0815  BP: 115/75 (!) 123/96 138/79 114/75  Pulse: 92 100 100 82  Resp: 16 20 20 16   Temp: 98.5 F (36.9 C) 98 F (36.7 C) 98.4 F (36.9 C) (!) 97.5 F (36.4 C)  TempSrc: Oral Oral Oral Oral  SpO2: 100% 100% 100% 100%  Weight:      Height:        Physical Examination:  Body mass index is 19.89 kg/m.   General: Alert awake, nonverbal, thinly built, not in obvious distress HENT:   No scleral pallor or icterus noted. Oral mucosa is moist.  Chest:  Clear breath  sounds. No crackles or wheezes.  CVS: S1 &S2 heard. No murmur.  Regular rate and rhythm. Abdomen: Soft, nontender, nondistended.  Bowel sounds are heard.   Extremities: No cyanosis, clubbing or edema.  Psych: Noninteractive, nonverbal, underlying dementia, CNS: Alert awake but nonverbal, moves extremities. Skin: Warm and  dry.  No rashes noted.    Data Reviewed: I have personally reviewed following labs and imaging studies CBC: Recent Labs  Lab 08/17/22 1615  WBC 6.6  HGB 13.6  HCT 41.0  MCV 87.6  PLT 275    Basic Metabolic Panel: Recent Labs  Lab 08/21/22 0500  NA 139  K 4.7  CL 107  CO2 24  GLUCOSE 115*  BUN 10  CREATININE 1.24*  CALCIUM 8.9   No results found for this or any previous visit (from the past 240 hour(s)).  Antimicrobials: Anti-infectives (From admission, onward)    None      Culture/Microbiology    Component Value Date/Time   SDES BLOOD LEFT HAND 01/12/2022 0828   SDES BLOOD RIGHT HAND 01/12/2022 0828   SPECREQUEST  01/12/2022 0828    BOTTLES DRAWN AEROBIC ONLY Blood Culture results may not be optimal due to an inadequate volume of blood received in culture bottles   SPECREQUEST  01/12/2022 0828    BOTTLES DRAWN AEROBIC ONLY Blood Culture results may not be optimal due to an inadequate volume of blood received in culture bottles   CULT  01/12/2022 0828    NO GROWTH 5 DAYS Performed at [facility], [address], [place], [place] [address]    CULT  01/12/2022 [phone]    NO GROWTH 5 DAYS Performed at [facility], [address], [place], [place] [address]    REPTSTATUS 01/17/2022 FINAL 01/12/2022 1914   REPTSTATUS 01/17/2022 FINAL 01/12/2022 7829  Radiology Studies: No results found.   LOS: 241 days   Joycelyn Das, MD [facility] 08/24/2022, 11:01 AM

## 2022-08-25 NOTE — Progress Notes (Signed)
PROGRESS NOTE AMANPREET BONURA  NWG:[id number] DOB: 1966-02-06 DOA: 12/24/2021 PCP: Lavinia Sharps, NP   Brief Narrative/Hospital Course:  Ms. 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  Psychiatry and Neurology evaluated patient during her stay.  Psychiatry patient does not meet criteria for inpatient behavioral health and advised memory care unit. Patient is medically stable for discharge and is awaiting placement at a memory care facility.   Significant events:  09/07/2021: admitted to behavioral health treated with Zyprexa Invega and Ativan challenge for catatonia. 12/24/2021: sent to ER from [facility] for evaluation for tachycardia 9/04-9/5, 9/5>9/9, LTM EEG  no seizure. TSH stable CTA chest no PE no pneumonia RPR nonreactive bilateral LE Doppler negative for DVT Echo with normal EF  9/23 febrile secondary to Covid 19 + no pneumonia. 9/25 repeat CT brain unremarkable. 06/24/2022  psychiatry consulted for increasing agitation  08/21/22 awaiting for placement.    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  Seen by neurology and psychiatry during hospitalization and underwent extensive workup including MRI brain, EEG which was unremarkable.  On supportive care including  Haldol, as needed Zyprexa, hydroxyzine and lorazepam, mirtazapine. Continue  delirium precautions, fall precaution and Recruitment consultant.  Patient to be stable at this time.   Unintentional weight loss, with inadequate oral intake: Continue Remeron, dysphagia 3 diet   Bipolar disorder:  Continue on Haldol, olanzapine, Atarax and.Ativan as needed.  Vitamin B12 deficiency: Resolved, last B12 1195 on 12/11.    Essential hypertension: Borderline low at this time.   CKD stage IIIa: Creatinine remains stable at 1-1.3 range, encourage oral hydration.  Latest creatinine of 1.2 from 08/21/2022    DVT prophylaxis: enoxaparin (LOVENOX) injection 40 mg Start: 12/26/21 1200  Code Status:   Code Status: Full Code  Family Communication: No one at bedside.  Sitter at bedside.  Patient status is: Inpatient because of unsafe disposition  Level of care: Med-Surg   Dispo: The patient is from: Behavioral health             Anticipated disposition: Awaiting Memory care  Subjective: Today, patient was seen and examined.  No interval complaints reported.  Sitter at bedside.   Objective:  Vitals last 24 hrs: Vitals:   08/23/22 0635 08/23/22 2126 08/24/22 0417 08/24/22 0815  BP: 115/75 (!) 123/96 138/79 114/75  Pulse: 92 100 100 82  Resp: 16 20 20 16   Temp:   98.4 F (36.9 C) (!) 97.5 F (36.4 C)  TempSrc: Oral Oral Oral Oral  SpO2: 100% 100% 100% 100%  Weight:   55.8 kg   Height:        Physical Examination:  Body mass index is 19.86 kg/m.   General: Alert awake but nonverbal, thinly built, not in obvious distress HENT:   No scleral pallor or icterus noted. Oral mucosa is moist.  Chest:  Clear breath sounds. No crackles or wheezes.  CVS: S1 &  S2 heard. No murmur.  Regular rate and rhythm. Abdomen: Soft, nontender, nondistended.  Bowel sounds are heard.   Extremities: No cyanosis, clubbing or edema.  Psych: Alert awake but not interactive, nonverbal, underlying dementia. CNS: Alert awake but nonverbal, moves extremities. Skin: Warm and dry.  No rashes noted.     Data Reviewed: I have personally reviewed following labs and imaging studies CBC:No results for input(s): "WBC", "NEUTROABS", "HGB", "HCT", "MCV", "PLT" in the last 168 hours.  Basic Metabolic Panel: Recent Labs  Lab 08/21/22 0500  NA 139  K 4.7  CL 107  CO2 24  GLUCOSE 115*  BUN 10  CREATININE 1.24*  CALCIUM 8.9   No results found for this or any previous visit (from the past 240 hour(s)).  Antimicrobials: Anti-infectives (From admission, onward)    None      Culture/Microbiology    Component Value Date/Time   SDES BLOOD LEFT HAND 01/12/2022 0828   SDES BLOOD RIGHT HAND 01/12/2022 0828   SPECREQUEST  01/12/2022 0828    BOTTLES DRAWN AEROBIC ONLY Blood Culture results may not be optimal due to an inadequate volume of blood received in culture bottles   SPECREQUEST  01/12/2022 0828    BOTTLES DRAWN AEROBIC ONLY Blood Culture results may not be optimal due to an inadequate volume of blood received in culture bottles   CULT  01/12/2022 0828    NO GROWTH 5 DAYS Performed at [facility], [address], [place], [place] [address]    CULT  01/12/2022 [phone]    NO GROWTH 5 DAYS Performed at [facility], [address] [place], [place] [address]    REPTSTATUS 01/17/2022 FINAL 01/12/2022 [id number]   REPTSTATUS 01/17/2022 FINAL 01/12/2022 1324  Radiology Studies: No results found.   LOS: 242 days   Joycelyn Das, MD [facility] 08/25/2022, 11:19 AM

## 2022-08-26 MED ORDER — POLYETHYLENE GLYCOL 3350 17 G PO PACK
17.0000 g | PACK | Freq: Every day | ORAL | Status: DC
  Administered 2022-08-26 – 2023-01-22 (×107): 17 g via ORAL
  Filled 2022-08-26 (×121): qty 1

## 2022-08-26 NOTE — Progress Notes (Addendum)
Nutrition Follow-up  DOCUMENTATION CODES:   Not applicable  INTERVENTION:  - Continue Dys 3 diet.   - Continue to monitor weight trends.   NUTRITION DIAGNOSIS:   Altered GI function related to constipation as evidenced by other (comment) (last BM 10 days ago).  GOAL:   Other (Comment) (more frequent bowel movements)  MONITOR:   I & O's  REASON FOR ASSESSMENT:   LOS    ASSESSMENT:   57 y.o. female admits related to AMS. PMH includes: Bipolar disorde rand HTN. Pt is currently receiving medical management related to early onset dementia with behavioral disturbances.  Meds reviewed: Vit B12, remeron, senokot. Labs reviewed: WDL.   No sitter present at time of assessment. Per record, pt has been eating well. PO intakes consistent over the past 2 weeks. However, weights continue to trend down. Pt has experienced a 6% wt loss in the past month. RD discussed wt loss and lack of BM in 10 days with MD. Will continue to monitor PO intakes.   Diet Order:   Diet Order             DIET DYS 3 Room service appropriate? Yes; Fluid consistency: Thin  Diet effective now                   EDUCATION NEEDS:   Not appropriate for education at this time  Skin:  Skin Assessment: Reviewed RN Assessment  Last BM:  4/26 - type 1  Height:   Ht Readings from Last 1 Encounters:  02/03/22 5\' 6"  (1.676 m)    Weight:   Wt Readings from Last 1 Encounters:  08/24/22 55.8 kg    Ideal Body Weight:     BMI:  Body mass index is 19.86 kg/m.  Estimated Nutritional Needs:   Kcal:  1800-2160 kcals  Protein:  90-105 gm  Fluid:  >/= 1.8 L  Bethann Humble, RD, LDN, CNSC.

## 2022-08-26 NOTE — Progress Notes (Signed)
PROGRESS NOTE Natalie Brown  GNF:[id number] DOB: Aug 10, 1965 DOA: 12/24/2021 PCP: Lavinia Sharps, NP   Brief Narrative/Hospital Course:  Ms. Brugger is a 57 yo female with a PMH of Bipolar Affective Disorder and Chronic Dementia with Advanced Cerebral Atrophy and Behavioral Disturbances was admitted on 09/07/21 with acute mania and aggressive behavior.  Psychiatry and Neurology evaluated patient during her stay.  Psychiatry patient does not meet criteria for inpatient behavioral health and advised memory care unit. Patient is medically stable for discharge and is awaiting placement at a memory care facility.   Significant events:  09/07/2021: admitted to behavioral health treated with Zyprexa Invega and Ativan challenge for catatonia. 12/24/2021: sent to ER from [facility] for evaluation for tachycardia 9/04-9/5, 9/5>9/9, LTM EEG  no seizure. TSH stable CTA chest no PE no pneumonia RPR nonreactive bilateral LE Doppler negative for DVT Echo with normal EF  9/23 febrile secondary to Covid 19 + no pneumonia. 9/25 repeat CT brain unremarkable. 06/24/2022  psychiatry consulted for increasing agitation  08/21/22 awaiting for placement.    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  Seen by neurology and psychiatry during hospitalization and underwent extensive workup including MRI brain, EEG which was unremarkable.  On supportive care including  Haldol, as needed Zyprexa, hydroxyzine and lorazepam, mirtazapine. Continue  delirium precautions, fall precaution and Recruitment consultant.  Patient to be stable at this time.  Did have a fall today without any injury.   Unintentional weight loss, with inadequate oral intake: Continue Remeron, dysphagia 3 diet   Bipolar disorder:  Continue on Haldol, olanzapine, Atarax and.Ativan as needed.  Vitamin B12 deficiency: Resolved, last B12 1195 on 12/11.    Essential hypertension: Borderline low at this time.   CKD stage IIIa: Creatinine remains stable at 1-1.3 range, encourage oral hydration.  Latest creatinine of 1.2 from 08/21/2022    DVT prophylaxis: enoxaparin (LOVENOX) injection 40 mg Start: 12/26/21 1200  Code Status:   Code Status: Full Code  Family Communication: No one at bedside.  Sitter at bedside.  Patient status is: Inpatient because of unsafe disposition  Level of care: Med-Surg   Dispo: The patient is from: Behavioral health             Anticipated disposition: Awaiting Memory care  Subjective: Today, patient was seen and examined at bedside.  Nonverbal.  Sitter at bedside.  Nursing staff reported that that she did have a fall today.  Objective:  Vitals last 24 hrs: Vitals:   08/24/22 0815 08/25/22 1955 08/26/22 0621 08/26/22 0803  BP: 114/75 108/79 (!) 124/95 (!) 117/96  Pulse: 82 93 93 97  Resp: 16 16 16    Temp:  98.9 F (37.2 C) 98.6 F (37 C)   TempSrc: Oral  Oral   SpO2: 100% 100% 100%   Weight:      Height:        Physical Examination:  Body mass index is 19.86 kg/m.   General: Thinly built, not in obvious distress alert awake, nonverbal, HENT:   No scleral pallor or icterus noted.  Oral mucosa is moist.  Chest:  Clear breath sounds.   No crackles or wheezes.  CVS: S1 &S2 heard. No murmur.  Regular rate and rhythm. Abdomen: Soft, nontender, nondistended.  Bowel sounds are heard.   Extremities: No cyanosis, clubbing or edema.  Peripheral pulses are palpable. Psych: Alert, awake and nonverbal, underlying dementia CNS:  Nonverbal, moves extremities Skin: Warm and dry.  No rashes noted.    Data Reviewed: I have personally reviewed following labs and imaging studies CBC:No results for input(s): "WBC", "NEUTROABS", "HGB", "HCT", "MCV", "PLT" in the last 168 hours.  Basic Metabolic Panel: Recent Labs  Lab 08/21/22 0500  NA 139  K 4.7  CL 107  CO2 24  GLUCOSE 115*  BUN 10  CREATININE 1.24*  CALCIUM 8.9   No results found for this or any previous visit (from the past 240 hour(s)).  Antimicrobials: Anti-infectives (From admission, onward)    None      Culture/Microbiology    Component Value Date/Time   SDES BLOOD LEFT HAND 01/12/2022 0828   SDES BLOOD RIGHT HAND 01/12/2022 0828   SPECREQUEST  01/12/2022 0828    BOTTLES DRAWN AEROBIC ONLY Blood Culture results may not be optimal due to an inadequate volume of blood received in culture bottles   SPECREQUEST  01/12/2022 0828    BOTTLES DRAWN AEROBIC ONLY Blood Culture results may not be optimal due to an inadequate volume of blood received in culture bottles   CULT  01/12/2022 0828    NO GROWTH 5 DAYS Performed at [facility], [address], [place], [place] [address]    CULT  01/12/2022 [phone]    NO GROWTH 5 DAYS Performed at [facility], [address] [place], [place] [address]    REPTSTATUS 01/17/2022 FINAL 01/12/2022 4132   REPTSTATUS 01/17/2022 FINAL 01/12/2022 4401  Radiology Studies: No results found.   LOS: 243 days   Joycelyn Das, MD [facility] 08/26/2022, 10:03 AM

## 2022-08-27 NOTE — TOC Progression Note (Signed)
Transition of Care [facility]) - Progression Note    Patient Details  Name: Natalie Brown MRN: [id number] Date of Birth: 1965-10-21  Transition of Care [facility]) CM/SW Contact  Inis Sizer, LCSW Phone Number: 08/27/2022, 8:16 AM  Clinical Narrative:    CSW spoke with Meriam Sprague of financial counseling who states she has been in contact with the patient's mother to assist with getting Medicaid issues addressed.  CSW spoke with [place] [facility] who states she will speak with staff at the [place] facility to determine bed availability and return call to CSW with updates.   Barriers to Discharge: Financial Resources, Inadequate or no insurance, Homeless with medical needs  Expected Discharge Plan and Services In-house Referral: Clinical Social Work   Post Acute Care Choice: Nursing Home Living arrangements for the past 2 months: Homeless [facility]) Expected Discharge Date: 12/24/21                                     Social Determinants of Health (SDOH) Interventions SDOH Screenings   Food Insecurity: No Food Insecurity (03/05/2022)  Housing: High Risk (03/21/2022)  Alcohol Screen: Low Risk  (09/06/2021)  Depression (PHQ2-9): Medium Risk (05/26/2020)  Tobacco Use: High Risk (03/21/2022)    Readmission Risk Interventions     No data to display

## 2022-08-27 NOTE — Progress Notes (Signed)
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  Psychiatry and Neurology evaluated the patient during her stay.  Psychiatry patient does not meet criteria for inpatient behavioral health and advised memory care unit. Patient is medically stable for discharge and is awaiting placement at a memory care facility.   Significant events:  09/07/2021: admitted to behavioral health treated with Zyprexa Invega and Ativan challenge for catatonia. 12/24/2021: sent to ER from [facility] for evaluation for tachycardia 9/04-9/5, 9/5>9/9, LTM EEG  no seizure. TSH stable CTA chest no PE no pneumonia RPR nonreactive bilateral LE Doppler negative for DVT Echo with normal EF  9/23 febrile secondary to Covid 19 + no pneumonia. 9/25 repeat CT brain unremarkable. 06/24/2022  psychiatry consulted for increasing agitation  08/21/22 awaiting for placement.    Assessment and Plan: Principal Problem:   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) Active Problems:   Delirium due to multiple etiologies, acute, hyperactive   Bipolar affective disorder, current episode manic with psychotic symptoms (HCC)   Sinus tachycardia   Tobacco abuse   Altered mental state   B12 deficiency   COVID-19 virus infection   Drooling   Essential hypertension  Acute metabolic encephalopathy on chronic progressive early onset dementia: Patient continues to remain nonverbal, non interactive.  Seen by neurology and psychiatry during hospitalization and underwent extensive workup including MRI brain, EEG which was unremarkable.  On supportive care including  Haldol, as needed Zyprexa, hydroxyzine and lorazepam, mirtazapine. Continue  delirium precautions, fall precaution and Recruitment consultant.  Patient to be stable at this time.  Did have a fall today without any injury.   Unintentional weight loss, with inadequate oral intake: Continue Remeron, dysphagia 3 diet   Bipolar disorder:  Continue on Haldol, olanzapine, Atarax and Ativan as needed.  Vitamin B12 deficiency: Resolved, last B12 1195 on 12/11.    Essential hypertension: Borderline low at this time.  Latest blood pressure of 95/67.  Not on medications.  CKD stage IIIa: Creatinine remains stable at 1-1.3 range, encourage oral hydration.  Latest creatinine of 1.2 from 08/21/2022    DVT prophylaxis: enoxaparin (LOVENOX) injection 40 mg Start: 12/26/21 1200  Code Status:   Code Status: Full Code  Family Communication: No one at bedside.  Sitter at bedside.  Patient status is: Inpatient because of unsafe disposition  Level of care: Med-Surg   Dispo: The patient is from: Behavioral health             Anticipated disposition: Awaiting Memory care  Subjective: Today, patient was seen and examined at bedside.  No interval changes.  Patient nonverbal.  Ophelia Charter at bedside.  Sitter stated that she did have good night sleep.    Objective:  Vitals last 24 hrs: Vitals:   08/26/22 1634 08/26/22 2229 08/27/22 0500 08/27/22 0636  BP: 95/70 102/67  95/67  Pulse: 73 61  66  Resp: 18 14  14   Temp: 97.7 F (36.5 C) (!) 97.1 F (36.2 C)  98.1 F (36.7 C)  TempSrc: Oral Axillary    SpO2: 100% 100%  97%  Weight:   55.3 kg   Height:        Physical Examination:  Body mass index is 19.68 kg/m.  General: Alert awake but nonverbal, thinly built, not in obvious distress,  HENT:   No scleral pallor or icterus noted. Oral mucosa is moist.  Chest:  Clear breath sounds.   No crackles or wheezes.  CVS: S1 &S2 heard. No murmur.  Regular rate and rhythm. Abdomen: Soft, nontender, nondistended.  Bowel sounds are heard.   Extremities: No cyanosis, clubbing or edema.    Psych: Nonverbal, underlying dementia, CNS: Nonverbal, moves extremities Skin: Warm and dry.  No rashes noted.    Data Reviewed: I have personally reviewed following labs and imaging studies CBC:No results for input(s): "WBC", "NEUTROABS", "HGB", "HCT", "MCV", "PLT" in the last 168 hours.  Basic Metabolic Panel: Recent Labs  Lab 08/21/22 0500  NA 139  K 4.7  CL 107  CO2 24  GLUCOSE 115*  BUN 10  CREATININE 1.24*  CALCIUM 8.9   No results found for this or any previous visit (from the past 240 hour(s)).  Antimicrobials: Anti-infectives (From admission, onward)    None      Culture/Microbiology    Component Value Date/Time   SDES BLOOD LEFT HAND 01/12/2022 0828   SDES BLOOD RIGHT HAND 01/12/2022 0828   SPECREQUEST  01/12/2022 0828    BOTTLES DRAWN AEROBIC ONLY Blood Culture results may not be optimal due to an inadequate volume of blood received in culture bottles   SPECREQUEST  01/12/2022 0828    BOTTLES DRAWN AEROBIC ONLY Blood Culture results may not be optimal due to an inadequate volume of blood received in culture bottles   CULT  01/12/2022 0828    NO GROWTH 5 DAYS Performed at [facility], [address] [place], [place] [address]    CULT  01/12/2022 [phone]    NO GROWTH 5 DAYS Performed at [facility], [address] [place], [place] [address]    REPTSTATUS 01/17/2022 FINAL 01/12/2022 1914   REPTSTATUS 01/17/2022 FINAL 01/12/2022 7829  Radiology Studies: No results found.   LOS: 244 days   Joycelyn Das, MD [facility] 08/27/2022, 9:38 AM

## 2022-08-27 NOTE — Evaluation (Signed)
Physical Therapy Evaluation Patient Details Name: Natalie Brown MRN: [id number] DOB: [birth year] Today's Date: 08/27/2022  History of Present Illness  57 yo female presents to [facility] on 09/07/2021 for catatonia treatment, arrived from [facility] on 12/24/21 for tachycardia, covid + 01/12/22, Fall on 08/26/2022. 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  Clinical Impression   PT reconsulted given balance deficits and recent fall. Pt presents with generalized weakness, impaired balance, deacreased activity tolerance. Pt to benefit from acute PT to address deficits. Pt ambulated short hallway distance, pt limited by fatigue and desire to return to room for continued rest. PT will follow pt on a trial basis while acute. PT to progress mobility as tolerated, and will continue to follow acutely.         Recommendations for follow up therapy are one component of a multi-disciplinary discharge planning process, led by the attending physician.  Recommendations may be updated based on patient status, additional functional criteria and insurance authorization.  Follow Up Recommendations       Assistance Recommended at Discharge Intermittent Supervision/Assistance  Patient can return home with the following  Direct supervision/assist for medications management;A little help with bathing/dressing/bathroom;Assistance with cooking/housework;Assist for transportation;A little help with walking and/or transfers;Direct supervision/assist for financial management;Help with stairs or ramp for entrance    Equipment Recommendations None recommended by PT  Recommendations for Other Services       Functional Status Assessment Patient has had a recent decline in their functional status and demonstrates the ability to make significant improvements in function in a reasonable and predictable amount of time.     Precautions / Restrictions  Precautions Precautions: Fall Precaution Comments: impulsive Restrictions Weight Bearing Restrictions: No      Mobility  Bed Mobility Overal bed mobility: Needs Assistance Bed Mobility: Supine to Sit, Sit to Supine     Supine to sit: Min assist Sit to supine: Modified independent (Device/Increase time)   General bed mobility comments: light assist for trunk and LE management when moving to EOB, pt attempts to return self to supine multiple times    Transfers Overall transfer level: Needs assistance Equipment used: 1 person hand held assist Transfers: Sit to/from Stand Sit to Stand: Min assist           General transfer comment: light rise and steady assist    Ambulation/Gait Ambulation/Gait assistance: Min assist, Min guard Gait Distance (Feet): 100 Feet Assistive device: 1 person hand held assist Gait Pattern/deviations: Step-through pattern, Decreased stride length, Ataxic, Scissoring, Drifts right/left Gait velocity: decr     General Gait Details: occasional assist to correct balance, posterior bias. Pt resisting further mobility once at the nurses station, does not require assist walking back from RN station to room  Stairs            Wheelchair Mobility    Modified Rankin (Stroke Patients Only)       Balance Overall balance assessment: Needs assistance, History of Falls Sitting-balance support: Feet supported Sitting balance-Leahy Scale: Good     Standing balance support: No upper extremity supported Standing balance-Leahy Scale: Fair Standing balance comment: fair to poor, periods of LOB                             Pertinent Vitals/Pain Pain Assessment Pain Assessment: No/denies pain Pain Intervention(s): Limited activity within patient's tolerance, Monitored during session    Home Living Family/patient expects to  be discharged to:: Unsure                        Prior Function Prior Level of Function : Patient  poor historian/Family not available             Mobility Comments: pt has been hospitalized 245 days, has been mobilizing some with staff acutely. history of fall on 5/6 due to LOB       Hand Dominance        Extremity/Trunk Assessment   Upper Extremity Assessment Upper Extremity Assessment: Defer to OT evaluation    Lower Extremity Assessment Lower Extremity Assessment: Generalized weakness;Difficult to assess due to impaired cognition    Cervical / Trunk Assessment Cervical / Trunk Assessment: Normal  Communication   Communication: Expressive difficulties (nonverbal on eval)  Cognition Arousal/Alertness: Awake/alert Behavior During Therapy: Flat affect Overall Cognitive Status: History of cognitive impairments - at baseline                     Current Attention Level: Focused   Following Commands: Follows one step commands inconsistently Safety/Judgement: Decreased awareness of deficits Awareness: Intellectual Problem Solving: Slow processing, Requires verbal cues, Requires tactile cues, Difficulty sequencing, Decreased initiation General Comments: pt sleeping upon PT arrival to room, wakes slowly and requires multimodal cuing for participation in PT        General Comments      Exercises     Assessment/Plan    PT Assessment Patient needs continued PT services  PT Problem List Decreased safety awareness;Decreased mobility;Decreased activity tolerance;Decreased strength;Decreased balance;Decreased cognition       PT Treatment Interventions Balance training;Gait training;Functional mobility training;Therapeutic activities;Patient/family education    PT Goals (Current goals can be found in the Care Plan section)  Acute Rehab PT Goals Patient Stated Goal: none stated PT Goal Formulation: With patient Time For Goal Achievement: 09/10/22 Potential to Achieve Goals: Good    Frequency Min 1X/week     Co-evaluation               AM-PAC  PT "6 Clicks" Mobility  Outcome Measure Help needed turning from your back to your side while in a flat bed without using bedrails?: A Little Help needed moving from lying on your back to sitting on the side of a flat bed without using bedrails?: A Little Help needed moving to and from a bed to a chair (including a wheelchair)?: A Little Help needed standing up from a chair using your arms (e.g., wheelchair or bedside chair)?: A Little Help needed to walk in hospital room?: A Little Help needed climbing 3-5 steps with a railing? : A Little 6 Click Score: 18    End of Session   Activity Tolerance: Patient limited by fatigue Patient left: in bed;with call bell/phone within reach;with family/visitor present;with nursing/sitter in room Nurse Communication: Mobility status PT Visit Diagnosis: Other abnormalities of gait and mobility (R26.89)    Time: 1610-9604 PT Time Calculation (min) (ACUTE ONLY): 18 min   Charges:   PT Evaluation $PT Eval Low Complexity: 1 Low          Stevon Gough S, PT DPT Acute Rehabilitation Services Secure Chat Preferred  Office [phone]   Tevita Gomer Sheliah Plane 08/27/2022, 6:10 PM

## 2022-08-28 NOTE — Progress Notes (Addendum)
Natalie Brown  [id number] DOB: [birth year] DOA: 12/24/2021 PCP: Lavinia Sharps, NP    Brief Narrative:  57 year old with a history of bipolar disorder and chronic dementia with advanced cerebral atrophy who was admitted to the acute units 12/24/2021 following a preceding admission at [facility] 09/06/2021-12/24/2021.  She was initially admitted to [facility] with acute mania and aggressive behaviors.  On 12/24/2021 however she became nearly catatonic and was transferred to the ER and ultimately admitted.  During her acute hospital stay she has been evaluated by neurology and psychiatry.  EEG has revealed no evidence of seizure activity.  Evaluation for pulmonary embolism was also negative as well as the evaluation for DVT.  EF was found to be normal on TTE.  Repeat brain imaging was unremarkable.  The patient has been deemed medically stable and is awaiting a disposition.  Consultants:  Psychiatry Neurology  Goals of Care:  Code Status: Full Code   DVT prophylaxis: Lovenox  Interim Hx: Afebrile.  Vital signs stable.  No acute events recorded overnight. No signs of distress at the time of my visit.   Assessment & Plan:  Acute metabolic encephalopathy on chronic progressive acute onset dementia Patient is most frequently nonverbal and non-interactive - she has been evaluated by Psychiatry and Neurology during this hospitalization with an extensive workup to include MRI of the brain and EEG both of which were unremarkable - supportive care continues with use of Haldol and as needed Zyprexa hydroxyzine lorazepam and mirtazapine - medically stable at this time  Unintentional weight loss with inadequate oral intake Continue Remeron  Bipolar disorder Continue Haldol, Zyprexa, Atarax, and Ativan as needed  Vitamin B12 deficiency Corrected with supplementation  HTN Not presently requiring medications  CKD stage IIIa Creatinine remained stable at baseline range of 1-1.3  Family  Communication:  Disposition: Awaiting placement within a memory care unit which has been difficult as the patient is homeless and uninsured   Objective: Blood pressure 114/77, pulse 94, temperature 98 F (36.7 C), resp. rate 18, height 5\' 6"  (1.676 m), weight 55.3 kg, SpO2 99 %.  Intake/Output Summary (Last 24 hours) at 08/28/2022 0847 Last data filed at 08/27/2022 1100 Gross per 24 hour  Intake 80 ml  Output --  Net 80 ml   Filed Weights   08/13/22 0720 08/24/22 0417 08/27/22 0500  Weight: 55.9 kg 55.8 kg 55.3 kg    Examination: General: No acute respiratory distress Lungs: Clear to auscultation bilaterally without wheezes or crackles Cardiovascular: Regular rate and rhythm without murmur gallop or rub normal S1 and S2 Abdomen: Nontender, nondistended, soft, bowel sounds positive, no rebound, no ascites, no appreciable mass Extremities: No significant cyanosis, clubbing, or edema bilateral lower extremities  CBC: No results for input(s): "WBC", "NEUTROABS", "HGB", "HCT", "MCV", "PLT" in the last 168 hours.  Basic Metabolic Panel: No results for input(s): "NA", "K", "CL", "CO2", "GLUCOSE", "BUN", "CREATININE", "CALCIUM", "MG", "PHOS" in the last 168 hours.  GFR: Estimated Creatinine Clearance: 44.8 mL/min (A) (by C-G formula based on SCr of 1.24 mg/dL (H)).   Scheduled Meds:  vitamin B-12  500 mcg Oral Daily   enoxaparin (LOVENOX) injection  40 mg Subcutaneous Q24H   haloperidol  2 mg Oral BID   hydrocerin   Topical BID   mirtazapine  15 mg Oral QHS   polyethylene glycol  17 g Oral Daily   senna-docusate  2 tablet Oral QHS     LOS: 245 days   Lonia Blood, MD [facility] Office  [phone] Pager - Text Page per Loretha Stapler  If 7PM-7AM, please contact night-coverage per Amion 08/28/2022, 8:47 AM

## 2022-08-29 LAB — CBC
HCT: 46 % (ref 36.0–46.0)
Hemoglobin: 15.8 g/dL — ABNORMAL HIGH (ref 12.0–15.0)
MCH: 29.3 pg (ref 26.0–34.0)
MCHC: 34.3 g/dL (ref 30.0–36.0)
MCV: 85.3 fL (ref 80.0–100.0)
Platelets: 330 10*3/uL (ref 150–400)
RBC: 5.39 MIL/uL — ABNORMAL HIGH (ref 3.87–5.11)
RDW: 13.1 % (ref 11.5–15.5)
WBC: 6.3 10*3/uL (ref 4.0–10.5)
nRBC: 0 % (ref 0.0–0.2)

## 2022-08-29 LAB — BASIC METABOLIC PANEL
Anion gap: 9 (ref 5–15)
BUN: 17 mg/dL (ref 6–20)
CO2: 23 mmol/L (ref 22–32)
Calcium: 9.5 mg/dL (ref 8.9–10.3)
Chloride: 107 mmol/L (ref 98–111)
Creatinine, Ser: 1.26 mg/dL — ABNORMAL HIGH (ref 0.44–1.00)
GFR, Estimated: 50 mL/min — ABNORMAL LOW (ref >60)
Glucose, Bld: 132 mg/dL — ABNORMAL HIGH (ref 70–99)
Potassium: 4 mmol/L (ref 3.5–5.1)
Sodium: 139 mmol/L (ref 135–145)

## 2022-08-29 LAB — MAGNESIUM: Magnesium: 2.1 mg/dL (ref 1.7–2.4)

## 2022-08-29 NOTE — Progress Notes (Signed)
Natalie Brown  ZOX:[id number] DOB: November 20, 1965 DOA: 12/24/2021 PCP: Lavinia Sharps, NP    Brief Narrative:  57 year old with a history of bipolar disorder and chronic dementia with advanced cerebral atrophy who was admitted to the acute units 12/24/2021 following a preceding admission at [facility] 09/06/2021-12/24/2021.  She was initially admitted to [facility] with acute mania and aggressive behaviors.  On 12/24/2021 however she became nearly catatonic and was transferred to the ER and ultimately admitted.  During her acute hospital stay she has been evaluated by Neurology and Psychiatry.  EEG has revealed no evidence of seizure activity.  Evaluation for pulmonary embolism was also negative as well as the evaluation for DVT.  EF was found to be normal on TTE.  Repeat brain imaging was unremarkable.  The patient has been deemed medically stable and is awaiting a disposition.  Consultants:  Psychiatry Neurology  Goals of Care:  Code Status: Full Code   DVT prophylaxis: Lovenox  Interim Hx: No acute events were noted overnight.  Afebrile.  Vital signs stable.  Resting comfortably in bed at the time of visit.  No evidence of distress.  Opens her eyes and follows me around the room but does not answer questions.  Assessment & Plan:  Acute metabolic encephalopathy on chronic progressive acute onset dementia Patient is most frequently nonverbal and non-interactive - she has been evaluated by Psychiatry and Neurology during this hospitalization with an extensive workup to include MRI of the brain and EEG both of which were unremarkable - supportive care continues with use of Haldol and as needed Zyprexa hydroxyzine lorazepam and mirtazapine - medically stable at this time  Unintentional weight loss with inadequate oral intake Continue Remeron  Bipolar disorder Continue Haldol, Zyprexa, Atarax, and Ativan as needed  Vitamin B12 deficiency Corrected with supplementation  HTN Not presently  requiring medications  CKD stage IIIa Creatinine remains stable at baseline range of 1-1.3  Family Communication: No family present at time of exam Disposition: Awaiting placement within a memory care unit which has been difficult as the patient is homeless and uninsured   Objective: Blood pressure 129/82, pulse 99, temperature 98.4 F (36.9 C), temperature source Oral, resp. rate 18, height 5\' 6"  (1.676 m), weight 55.3 kg, SpO2 100 %. No intake or output data in the 24 hours ending 08/29/22 0834  Filed Weights   08/13/22 0720 08/24/22 0417 08/27/22 0500  Weight: 55.9 kg 55.8 kg 55.3 kg    Examination: General: No acute respiratory distress Lungs: Clear to auscultation bilaterally  Cardiovascular: Regular rate and rhythm without murmur  Abdomen: Nontender, nondistended, soft, bowel sounds positive Extremities: No significant edema bilateral lower extremities  CBC: No results for input(s): "WBC", "NEUTROABS", "HGB", "HCT", "MCV", "PLT" in the last 168 hours.  Basic Metabolic Panel: No results for input(s): "NA", "K", "CL", "CO2", "GLUCOSE", "BUN", "CREATININE", "CALCIUM", "MG", "PHOS" in the last 168 hours.  GFR: Estimated Creatinine Clearance: 44.8 mL/min (A) (by C-G formula based on SCr of 1.24 mg/dL (H)).   Scheduled Meds:  vitamin B-12  500 mcg Oral Daily   enoxaparin (LOVENOX) injection  40 mg Subcutaneous Q24H   haloperidol  2 mg Oral BID   hydrocerin   Topical BID   mirtazapine  15 mg Oral QHS   polyethylene glycol  17 g Oral Daily   senna-docusate  2 tablet Oral QHS     LOS: 246 days   Lonia Blood, MD [facility] Office  [phone] Pager - Text Page per  Amion  If 7PM-7AM, please contact night-coverage per Amion 08/29/2022, 8:34 AM

## 2022-08-30 NOTE — Progress Notes (Signed)
Natalie Brown  [id number] DOB: March 17, 1966 DOA: 12/24/2021 PCP: Lavinia Sharps, NP    Brief Narrative:  57 year old with a history of bipolar disorder and chronic dementia with advanced cerebral atrophy who was admitted to the acute units 12/24/2021 following a preceding admission at [facility] 09/06/2021-12/24/2021.  She was initially admitted to [facility] with acute mania and aggressive behaviors.  On 12/24/2021 however she became nearly catatonic and was transferred to the ER and ultimately admitted.  During her acute hospital stay she has been evaluated by Neurology and Psychiatry.  EEG has revealed no evidence of seizure activity.  Evaluation for pulmonary embolism was also negative as well as the evaluation for DVT.  EF was found to be normal on TTE.  Repeat brain imaging was unremarkable.  The patient has been deemed medically stable and is awaiting a disposition.  Consultants:  Psychiatry Neurology  Goals of Care:  Code Status: Full Code   DVT prophylaxis: Lovenox  Interim Hx: No acute events recorded overnight.  Afebrile with stable vitals.  No new labs for today.  No evidence of distress at time of bedside visit.  Assessment & Plan:  Acute metabolic encephalopathy on chronic progressive early onset dementia Patient is most frequently nonverbal and non-interactive - she has been evaluated by Psychiatry and Neurology during this hospitalization with an extensive workup to include MRI of the brain and EEG both of which were unremarkable - supportive care continues with use of Haldol and as needed Zyprexa hydroxyzine lorazepam and mirtazapine - medically stable at this time  Unintentional weight loss with inadequate oral intake Continue Remeron  Bipolar disorder Continue Haldol, Zyprexa, Atarax, and Ativan as needed  Vitamin B12 deficiency Corrected with supplementation  HTN Not presently requiring medications  CKD stage IIIa Creatinine remains stable at baseline range  of 1-1.3  Family Communication: No family present at time of exam Disposition: Awaiting placement within a memory care unit which has been difficult as the patient is homeless and uninsured   Objective: Blood pressure 102/61, pulse 94, temperature 98.4 F (36.9 C), temperature source Oral, resp. rate 18, height 5\' 6"  (1.676 m), weight 55.3 kg, SpO2 100 %.  Intake/Output Summary (Last 24 hours) at 08/30/2022 0758 Last data filed at 08/29/2022 0900 Gross per 24 hour  Intake 120 ml  Output --  Net 120 ml    Filed Weights   08/13/22 0720 08/24/22 0417 08/27/22 0500  Weight: 55.9 kg 55.8 kg 55.3 kg    Examination: General: No acute respiratory distress Lungs: Clear to auscultation bilaterally  Cardiovascular: Regular rate and rhythm without murmur  Extremities: No significant edema B LE  CBC: Recent Labs  Lab 08/29/22 1437  WBC 6.3  HGB 15.8*  HCT 46.0  MCV 85.3  PLT 330    Basic Metabolic Panel: Recent Labs  Lab 08/29/22 1437  NA 139  K 4.0  CL 107  CO2 23  GLUCOSE 132*  BUN 17  CREATININE 1.26*  CALCIUM 9.5  MG 2.1    GFR: Estimated Creatinine Clearance: 44 mL/min (A) (by C-G formula based on SCr of 1.26 mg/dL (H)).   Scheduled Meds:  vitamin B-12  500 mcg Oral Daily   enoxaparin (LOVENOX) injection  40 mg Subcutaneous Q24H   haloperidol  2 mg Oral BID   hydrocerin   Topical BID   mirtazapine  15 mg Oral QHS   polyethylene glycol  17 g Oral Daily   senna-docusate  2 tablet Oral QHS  LOS: 247 days   Lonia Blood, MD [facility] Office  [phone] Pager - Text Page per Amion  If 7PM-7AM, please contact night-coverage per Amion 08/30/2022, 7:58 AM

## 2022-08-31 NOTE — Progress Notes (Signed)
   Natalie Brown  [id number] DOB: 03-25-1966 DOA: 12/24/2021 PCP: Lavinia Sharps, NP    Brief Narrative:  57 year old with a history of bipolar disorder and chronic dementia with advanced cerebral atrophy who was admitted to the acute units 12/24/2021 following a preceding admission at [facility] 09/06/2021-12/24/2021.  She was initially admitted to [facility] with acute mania and aggressive behaviors.  On 12/24/2021 however she became nearly catatonic and was transferred to the ER and ultimately admitted.  During her acute hospital stay she has been evaluated by Neurology and Psychiatry.  EEG has revealed no evidence of seizure activity.  Evaluation for pulmonary embolism was also negative as well as the evaluation for DVT.  EF was found to be normal on TTE.  Repeat brain imaging was unremarkable.  The patient has been deemed medically stable and is awaiting a disposition.  Consultants:  Psychiatry Neurology  Goals of Care:  Code Status: Full Code   DVT prophylaxis: Lovenox  Interim Hx: Remains medically stable for disposition.  Vital signs stable.  Afebrile. No evidence of distress.   Assessment & Plan:  Acute metabolic encephalopathy on chronic progressive early onset dementia Patient is most frequently nonverbal and non-interactive - she has been evaluated by Psychiatry and Neurology during this hospitalization with an extensive workup to include MRI of the brain and EEG both of which were unremarkable - supportive care continues with use of Haldol and as needed Zyprexa hydroxyzine lorazepam and mirtazapine - medically stable at this time  Unintentional weight loss with inadequate oral intake Continue mirtazapine  Bipolar disorder Continue Haldol, Zyprexa, hydroxyzine, and lorazepam as needed  Vitamin B12 deficiency Corrected with supplementation  HTN Not presently requiring medications  CKD stage IIIa Creatinine remains stable at baseline range of 1-1.3  Family  Communication: No family present at time of exam Disposition: Awaiting placement within a memory care unit which has been difficult as the patient is homeless and uninsured   Objective: Blood pressure 105/73, pulse (!) 105, temperature 97.6 F (36.4 C), temperature source Oral, resp. rate 18, height 5\' 6"  (1.676 m), weight 55.3 kg, SpO2 100 %. No intake or output data in the 24 hours ending 08/31/22 0809  Filed Weights   08/13/22 0720 08/24/22 0417 08/27/22 0500  Weight: 55.9 kg 55.8 kg 55.3 kg    Examination: General: No acute respiratory distress  CBC: Recent Labs  Lab 08/29/22 1437  WBC 6.3  HGB 15.8*  HCT 46.0  MCV 85.3  PLT 330     Basic Metabolic Panel: Recent Labs  Lab 08/29/22 1437  NA 139  K 4.0  CL 107  CO2 23  GLUCOSE 132*  BUN 17  CREATININE 1.26*  CALCIUM 9.5  MG 2.1     GFR: Estimated Creatinine Clearance: 44 mL/min (A) (by C-G formula based on SCr of 1.26 mg/dL (H)).   Scheduled Meds:  vitamin B-12  500 mcg Oral Daily   enoxaparin (LOVENOX) injection  40 mg Subcutaneous Q24H   haloperidol  2 mg Oral BID   hydrocerin   Topical BID   mirtazapine  15 mg Oral QHS   polyethylene glycol  17 g Oral Daily   senna-docusate  2 tablet Oral QHS     LOS: 248 days   Lonia Blood, MD [facility] Office  [phone] Pager - Text Page per Loretha Stapler  If 7PM-7AM, please contact night-coverage per Amion 08/31/2022, 8:09 AM

## 2022-09-01 NOTE — Progress Notes (Signed)
Natalie Brown  WUJ:[id number] DOB: [birth year] DOA: 12/24/2021 PCP: Lavinia Sharps, NP    Brief Narrative:  57 year old with a history of bipolar disorder and chronic dementia with advanced cerebral atrophy who was admitted to the acute units 12/24/2021 following a preceding admission at [facility] 09/06/2021-12/24/2021.  She was initially admitted to [facility] with acute mania and aggressive behaviors.  On 12/24/2021 however she became nearly catatonic and was transferred to the ER and ultimately admitted.  During her acute hospital stay she has been evaluated by Neurology and Psychiatry.  EEG has revealed no evidence of seizure activity.  Evaluation for pulmonary embolism was also negative as well as the evaluation for DVT.  EF was found to be normal on TTE.  Repeat brain imaging was unremarkable.  The patient has been deemed medically stable and is awaiting a disposition.  Consultants:  Psychiatry Neurology  Goals of Care:  Code Status: Full Code   DVT prophylaxis: Lovenox  Interim Hx: Remains medically stable for disposition.  Vital signs stable.  Afebrile.  No acute events recorded overnight.  Assessment & Plan:  Acute metabolic encephalopathy on chronic progressive early onset dementia Patient is most frequently nonverbal and non-interactive - she has been evaluated by Psychiatry and Neurology during this hospitalization with an extensive workup to include MRI of the brain and EEG both of which were unremarkable - supportive care continues with use of Haldol and as needed olanzapine, hydroxyzine, lorazepam, and mirtazapine - medically stable at this time and remains ready for disposition  Unintentional weight loss with inadequate oral intake Continue mirtazapine  Bipolar disorder Continue haloperidol, olanzapine, hydroxyzine, and lorazepam as needed  Vitamin B12 deficiency Corrected with supplementation  HTN Not presently requiring medications  CKD stage IIIa Creatinine  remains stable at baseline range of 1-1.3  Family Communication: No family present at time of exam Disposition: Awaiting placement within a memory care unit which has been difficult as the patient is homeless and uninsured   Objective: Blood pressure 103/69, pulse 64, temperature 97.6 F (36.4 C), temperature source Oral, resp. rate 17, height 5\' 6"  (1.676 m), weight 55.3 kg, SpO2 100 %.  Intake/Output Summary (Last 24 hours) at 09/01/2022 0831 Last data filed at 08/31/2022 1507 Gross per 24 hour  Intake 240 ml  Output --  Net 240 ml    Filed Weights   08/13/22 0720 08/24/22 0417 08/27/22 0500  Weight: 55.9 kg 55.8 kg 55.3 kg    Examination: General: No acute respiratory distress  CBC: Recent Labs  Lab 08/29/22 1437  WBC 6.3  HGB 15.8*  HCT 46.0  MCV 85.3  PLT 330    Basic Metabolic Panel: Recent Labs  Lab 08/29/22 1437  NA 139  K 4.0  CL 107  CO2 23  GLUCOSE 132*  BUN 17  CREATININE 1.26*  CALCIUM 9.5  MG 2.1     GFR: Estimated Creatinine Clearance: 44 mL/min (A) (by C-G formula based on SCr of 1.26 mg/dL (H)).   Scheduled Meds:  vitamin B-12  500 mcg Oral Daily   enoxaparin (LOVENOX) injection  40 mg Subcutaneous Q24H   haloperidol  2 mg Oral BID   hydrocerin   Topical BID   mirtazapine  15 mg Oral QHS   polyethylene glycol  17 g Oral Daily   senna-docusate  2 tablet Oral QHS     LOS: 249 days   Lonia Blood, MD [facility] Office  [phone] Pager - Text Page per Loretha Stapler  If  7PM-7AM, please contact night-coverage per Amion 09/01/2022, 8:31 AM

## 2022-09-02 NOTE — Progress Notes (Signed)
Natalie Brown  WGN:[id number] DOB: May 22, 1965 DOA: 12/24/2021 PCP: Lavinia Sharps, NP    Brief Narrative:  57 year old with a history of bipolar disorder and chronic dementia with advanced cerebral atrophy who was admitted to the acute units 12/24/2021 following a preceding admission at [facility] 09/06/2021-12/24/2021.  She was initially admitted to [facility] with acute mania and aggressive behaviors.  On 12/24/2021 however she became nearly catatonic and was transferred to the ER and ultimately admitted.  During her acute hospital stay she has been evaluated by Neurology and Psychiatry.  EEG has revealed no evidence of seizure activity.  Evaluation for pulmonary embolism was also negative as well as the evaluation for DVT.  EF was found to be normal on TTE.  Repeat brain imaging was unremarkable.  The patient has been deemed medically stable and is awaiting a disposition.  Consultants:  Psychiatry Neurology  Goals of Care:  Code Status: Full Code   DVT prophylaxis: Lovenox  Interim Hx: No acute events have been recorded since my last visit.  Afebrile.  Vital signs stable.  Appears to be comfortable in bed at the time of my visit.  No evidence of distress.  Assessment & Plan:  Acute metabolic encephalopathy on chronic progressive early onset dementia Patient is most frequently nonverbal and non-interactive - she has been evaluated by Psychiatry and Neurology during this hospitalization with an extensive workup to include MRI of the brain and EEG both of which were unremarkable - supportive care continues with use of Haldol and as needed olanzapine, hydroxyzine, lorazepam, and mirtazapine - medically stable at this time and remains ready for disposition  Unintentional weight loss with inadequate oral intake Continue mirtazapine  Bipolar disorder Continue haloperidol, olanzapine, hydroxyzine, and lorazepam as needed -no significant agitation at this time  Vitamin B12  deficiency Corrected with supplementation  HTN Not presently requiring medications  CKD stage IIIa Creatinine remains stable at baseline range of 1-1.3  Family Communication: No family present at time of exam Disposition: Awaiting placement within a memory care unit which has been difficult as the patient is homeless and uninsured   Objective: Blood pressure 136/85, pulse (!) 110, temperature (!) 97.5 F (36.4 C), temperature source Oral, resp. rate 16, height 5\' 6"  (1.676 m), weight 55.3 kg, SpO2 100 %.  Intake/Output Summary (Last 24 hours) at 09/02/2022 0748 Last data filed at 09/01/2022 2009 Gross per 24 hour  Intake 677 ml  Output --  Net 677 ml    Filed Weights   08/13/22 0720 08/24/22 0417 08/27/22 0500  Weight: 55.9 kg 55.8 kg 55.3 kg    Examination: General: No acute respiratory distress Lungs: Clear to auscultation bilaterally without wheezes or crackles Cardiovascular: Regular rate and rhythm without murmur  Abdomen: Nontender, nondistended, soft, bowel sounds positive Extremities: no C/C/E B LE    CBC: Recent Labs  Lab 08/29/22 1437  WBC 6.3  HGB 15.8*  HCT 46.0  MCV 85.3  PLT 330    Basic Metabolic Panel: Recent Labs  Lab 08/29/22 1437  NA 139  K 4.0  CL 107  CO2 23  GLUCOSE 132*  BUN 17  CREATININE 1.26*  CALCIUM 9.5  MG 2.1     GFR: Estimated Creatinine Clearance: 44 mL/min (A) (by C-G formula based on SCr of 1.26 mg/dL (H)).   Scheduled Meds:  vitamin B-12  500 mcg Oral Daily   enoxaparin (LOVENOX) injection  40 mg Subcutaneous Q24H   haloperidol  2 mg Oral BID  hydrocerin   Topical BID   mirtazapine  15 mg Oral QHS   polyethylene glycol  17 g Oral Daily   senna-docusate  2 tablet Oral QHS     LOS: 250 days   Lonia Blood, MD [facility] Office  [phone] Pager - Text Page per Loretha Stapler  If 7PM-7AM, please contact night-coverage per Amion 09/02/2022, 7:48 AM

## 2022-09-02 NOTE — Plan of Care (Signed)

## 2022-09-03 MED ORDER — ENSURE ENLIVE PO LIQD
237.0000 mL | ORAL | Status: DC
  Administered 2022-09-04 – 2022-09-06 (×3): 237 mL via ORAL

## 2022-09-03 NOTE — Progress Notes (Signed)
Natalie Brown  WUJ:[id number] DOB: [birth year] DOA: 12/24/2021 PCP: Lavinia Sharps, NP    Brief Narrative:  57 year old with a history of bipolar disorder and chronic dementia with advanced cerebral atrophy who was admitted to the acute units 12/24/2021 following a preceding admission at [facility] 09/06/2021-12/24/2021.  She was initially admitted to [facility] with acute mania and aggressive behaviors.  On 12/24/2021 she became nearly catatonic and was transferred to the ER and ultimately admitted.  During her acute hospital stay she has been evaluated by Neurology and Psychiatry.  EEG has revealed no evidence of seizure activity.  Evaluation for pulmonary embolism was also negative as well as the evaluation for DVT.  EF was found to be normal on TTE.  Repeat brain imaging was unremarkable. It was ultimately determined that her AMS was due to due progression of her severe underlying dementia.   The patient has been deemed medically stable and is awaiting a disposition.  The goal is to place her within the memory care unit but this is proving difficult as she is homeless and has no insurance.  Consultants:  Psychiatry Neurology  Goals of Care:  Code Status: Full Code   DVT prophylaxis: Lovenox  Interim Hx: No acute events reported overnight.  Afebrile.  Vital signs remain stable.  Sitting up in bed fiddling with her phone.  No acute distress.  Does not speak to me or answer questions.  She  Assessment & Plan:  Acute metabolic encephalopathy on chronic progressive early onset dementia Patient is most frequently nonverbal and non-interactive - she has been evaluated by Psychiatry and Neurology during this hospitalization with an extensive workup to include MRI of the brain and EEG both of which were unremarkable - supportive care continues with use of Haldol and as needed olanzapine, hydroxyzine, lorazepam, and mirtazapine - medically stable at this time and remains ready for disposition  -ultimately it is felt that her current mental status is a result of progressive severe early onset dementia and there is no expectation that it would improve beyond its current point but rather will in fact only decline further with time  Unintentional weight loss with inadequate oral intake Continue mirtazapine - check weight weekly  Bipolar disorder Continue haloperidol, olanzapine, hydroxyzine, and lorazepam as needed -no significant agitation at this time  Vitamin B12 deficiency Corrected with supplementation  HTN Not presently requiring medications  CKD stage IIIa Creatinine remains stable at baseline range of 1-1.3  Family Communication: No family present at time of exam Disposition: Awaiting placement within a memory care unit which has been difficult as the patient is homeless and uninsured   Objective: Blood pressure 100/69, pulse (!) 118, temperature 97.7 F (36.5 C), temperature source Oral, resp. rate 18, height 5\' 6"  (1.676 m), weight 55.3 kg, SpO2 100 %.  Intake/Output Summary (Last 24 hours) at 09/03/2022 0807 Last data filed at 09/02/2022 1857 Gross per 24 hour  Intake 312 ml  Output --  Net 312 ml    Filed Weights   08/13/22 0720 08/24/22 0417 08/27/22 0500  Weight: 55.9 kg 55.8 kg 55.3 kg    Examination: General: No acute respiratory distress Lungs: Clear to auscultation bilaterally  Cardiovascular: Regular rate and rhythm  Abdomen: Nontender, nondistended, soft, bowel sounds positive Extremities: no C/C/E B LE    CBC: Recent Labs  Lab 08/29/22 1437  WBC 6.3  HGB 15.8*  HCT 46.0  MCV 85.3  PLT 330    Basic Metabolic Panel: Recent Labs  Lab 08/29/22 1437  NA 139  K 4.0  CL 107  CO2 23  GLUCOSE 132*  BUN 17  CREATININE 1.26*  CALCIUM 9.5  MG 2.1     GFR: Estimated Creatinine Clearance: 44 mL/min (A) (by C-G formula based on SCr of 1.26 mg/dL (H)).   Scheduled Meds:  vitamin B-12  500 mcg Oral Daily   enoxaparin (LOVENOX)  injection  40 mg Subcutaneous Q24H   haloperidol  2 mg Oral BID   hydrocerin   Topical BID   mirtazapine  15 mg Oral QHS   polyethylene glycol  17 g Oral Daily   senna-docusate  2 tablet Oral QHS     LOS: 251 days   Lonia Blood, MD [facility] Office  [phone] Pager - Text Page per Loretha Stapler  If 7PM-7AM, please contact night-coverage per Amion 09/03/2022, 8:07 AM

## 2022-09-03 NOTE — Progress Notes (Signed)
Initial Nutrition Assessment  DOCUMENTATION CODES:   Not applicable  INTERVENTION:  - Continue Dys 3 diet.   - Modify to Ensure Enlive po q day, each supplement provides 350 kcal and 20 grams of protein.  NUTRITION DIAGNOSIS:   Altered GI function related to constipation as evidenced by other (comment) (last BM 10 days ago).  GOAL:   Other (Comment) (more frequent bowel movements)   MONITOR:   I & O's  REASON FOR ASSESSMENT:   LOS    ASSESSMENT:   57 y.o. female admits related to AMS. PMH includes: Bipolar disorde rand HTN. Pt is currently receiving medical management related to early onset dementia with behavioral disturbances.  Meds reviewed: Vit B12, remeron, miralax, senokot. Labs reviewed: WDL.   The pt ate 100% of her breakfast this am. Per record, pt has eaten mostly 75-100% of her meals with the exception of a couple of meals over the past 7 days. Weights continue to trend down. RD will add back Ensure to once daily. Will continue to monitor PO intakes.   Diet Order:   Diet Order             DIET DYS 3 Room service appropriate? Yes; Fluid consistency: Thin  Diet effective now                   EDUCATION NEEDS:   Not appropriate for education at this time  Skin:  Skin Assessment: Reviewed RN Assessment  Last BM:  5/14 - type 6  Height:   Ht Readings from Last 1 Encounters:  02/03/22 5\' 6"  (1.676 m)    Weight:   Wt Readings from Last 1 Encounters:  08/27/22 55.3 kg    Ideal Body Weight:     BMI:  Body mass index is 19.68 kg/m.  Estimated Nutritional Needs:   Kcal:  1800-2160 kcals  Protein:  90-105 gm  Fluid:  >/= 1.8 L  Bethann Humble, RD, LDN, CNSC.

## 2022-09-03 NOTE — Plan of Care (Signed)

## 2022-09-04 DIAGNOSIS — I10 Essential (primary) hypertension: Secondary | ICD-10-CM

## 2022-09-04 LAB — COMPREHENSIVE METABOLIC PANEL
ALT: 23 U/L (ref 0–44)
AST: 37 U/L (ref 15–41)
Albumin: 3.6 g/dL (ref 3.5–5.0)
Alkaline Phosphatase: 56 U/L (ref 38–126)
Anion gap: 9 (ref 5–15)
BUN: 15 mg/dL (ref 6–20)
CO2: 22 mmol/L (ref 22–32)
Calcium: 8.9 mg/dL (ref 8.9–10.3)
Chloride: 107 mmol/L (ref 98–111)
Creatinine, Ser: 1.08 mg/dL — ABNORMAL HIGH (ref 0.44–1.00)
GFR, Estimated: >60 mL/min (ref >60)
Glucose, Bld: 98 mg/dL (ref 70–99)
Potassium: 4.1 mmol/L (ref 3.5–5.1)
Sodium: 138 mmol/L (ref 135–145)
Total Bilirubin: 0.4 mg/dL (ref 0.3–1.2)
Total Protein: 6.8 g/dL (ref 6.5–8.1)

## 2022-09-04 LAB — CBC WITH DIFFERENTIAL/PLATELET
Abs Immature Granulocytes: 0.01 10*3/uL (ref 0.00–0.07)
Basophils Absolute: 0 10*3/uL (ref 0.0–0.1)
Basophils Relative: 1 %
Eosinophils Absolute: 0.1 10*3/uL (ref 0.0–0.5)
Eosinophils Relative: 1 %
HCT: 40.9 % (ref 36.0–46.0)
Hemoglobin: 13.7 g/dL (ref 12.0–15.0)
Immature Granulocytes: 0 %
Lymphocytes Relative: 25 %
Lymphs Abs: 1.4 10*3/uL (ref 0.7–4.0)
MCH: 28.4 pg (ref 26.0–34.0)
MCHC: 33.5 g/dL (ref 30.0–36.0)
MCV: 84.9 fL (ref 80.0–100.0)
Monocytes Absolute: 0.3 10*3/uL (ref 0.1–1.0)
Monocytes Relative: 4 %
Neutro Abs: 3.9 10*3/uL (ref 1.7–7.7)
Neutrophils Relative %: 69 %
Platelets: 294 10*3/uL (ref 150–400)
RBC: 4.82 MIL/uL (ref 3.87–5.11)
RDW: 13.2 % (ref 11.5–15.5)
WBC: 5.7 10*3/uL (ref 4.0–10.5)
nRBC: 0 % (ref 0.0–0.2)

## 2022-09-04 LAB — MAGNESIUM: Magnesium: 2.2 mg/dL (ref 1.7–2.4)

## 2022-09-04 LAB — PHOSPHORUS: Phosphorus: 3.5 mg/dL (ref 2.5–4.6)

## 2022-09-04 NOTE — Progress Notes (Signed)
PT Cancellation Note  Patient Details Name: Natalie Brown MRN: [id number] DOB: [birth year]   Cancelled Treatment:    Reason Eval/Treat Not Completed: Other (comment) Pt seen ambulating in hallway while carrying meal tray with supervision from sitter. Due to limiting cognitive deficits & current level of mobility, pt does not require acute PT services. PT to complete current orders. Anticipate pt will require 24 hr supervision and pt will remain a high fall risk 2/2 cognitive deficits.   Aleda Grana, PT, DPT 09/04/22, 10:24 AM  Sandi Mariscal 09/04/2022, 10:23 AM

## 2022-09-04 NOTE — Progress Notes (Signed)
PT Cancellation Note  Patient Details Name: Natalie Brown MRN: [id number] DOB: [birth year]   Cancelled Treatment:    Reason Eval/Treat Not Completed: Other (comment) Attempted to see pt for PT tx. Pt received sitting sideways in bed. Sitter in room attempted to assist PT in encouraging pt to ambulate, noting pt is able to ambulate while carrying her meal tray. When PT attempted to hand tray to pt pt began eating while in standing (although pt places food in her mouth but doesn't chew). Pt assisted to sitting. Will re-attempt later, however pt appears to be significantly impacted by impaired cognition.   Aleda Grana, PT, DPT 09/04/22, 8:51 AM   Sandi Mariscal 09/04/2022, 8:50 AM

## 2022-09-04 NOTE — Progress Notes (Signed)
PROGRESS NOTE    Natalie Brown  ZOX:[id number] DOB: 01-29-1966 DOA: 12/24/2021 PCP: Lavinia Sharps, NP   Brief Narrative:  Natalie Brown is a 57 year old with a history of bipolar disorder and chronic dementia with advanced cerebral atrophy who was admitted to the acute units 12/24/2021 following a preceding admission at [facility] 09/06/2021-12/24/2021.  She was initially admitted to [facility] with acute mania and aggressive behaviors.  On 12/24/2021 she became nearly catatonic and was transferred to the ER and ultimately admitted.  During her acute hospital stay she has been evaluated by Neurology and Psychiatry.  EEG has revealed no evidence of seizure activity.  Evaluation for pulmonary embolism was also negative as well as the evaluation for DVT.  EF was found to be normal on TTE.  Repeat brain imaging was unremarkable. It was ultimately determined that her AMS was due to due progression of her severe underlying dementia.    The patient has been deemed medically stable and is awaiting a disposition.  The goal is to place her within the memory care unit but this is proving difficult as she is homeless and has no insurance.  Significant events while hospitalized: 09/07/2021: admitted to behavioral health treated with Zyprexa Invega and Ativan challenge for catatonia. 12/24/2021: sent to ER from [facility] for evaluation for tachycardia 9/04-9/5, 9/5>9/9, LTM EEG  no seizure. TSH stable CTA chest no PE no pneumonia RPR nonreactive bilateral LE Doppler negative for DVT Echo with normal EF  9/23 febrile secondary to Covid 19 + no pneumonia. 9/25 repeat CT brain unremarkable. 06/24/2022  psychiatry consulted for increasing agitation    Assessment and Plan:  Acute metabolic encephalopathy on chronic progressive early onset dementia -Patient is most frequently nonverbal and non-interactive - she has been evaluated by Psychiatry and Neurology during this hospitalization with an extensive workup to include MRI of  the brain and EEG both of which were unremarkable  -Continue supportive care continues with use of Haldol and as needed olanzapine, hydroxyzine, lorazepam, and mirtazapine  -Remains medically stable at this time and remains ready for disposition  -Ultimately it is felt that her current mental status is a result of progressive severe early onset dementia and there is no expectation that it would improve beyond its current point but rather will in fact only decline further with time   Unintentional weight loss with inadequate oral intake -Continue mirtazapine - check weight weekly Filed Weights   08/24/22 0417 08/27/22 0500 09/03/22 0812  Weight: 55.8 kg 55.3 kg 54.5 kg   -Nutritionist consulted and recommending    Bipolar Disorder -Continue Haloperidol 2 mg po BID, Olanzapine 5 mg po/IM BIDprn Significant Agitation, Hydroxyzine 25 mg po TIDprn Anxiety, and Lorazepam 0.5 mg q6h as needed  -No significant agitation at this time   Vitamin B12 Deficiency -Corrected with Supplementation and last B12 Level was checked on 06/26/22 and was 1,212 -Currently receiving Cyanocobalamin 500 mcg po Daily   HTN -Not presently requiring medications -Continue to Monitor BP per Protocol -Last BP reading was 104/88  Tobacco Abuse -Nicotine Patch has been discontinued   CKD Stage IIIa -BUN/Cr Trend: Recent Labs  Lab 08/07/22 0940 08/21/22 0500 08/29/22 1437  BUN 15 10 17   CREATININE 1.14* 1.24* 1.26*  -Avoid Nephrotoxic Medications, Contrast Dyes, Hypotension and Dehydration to Ensure Adequate Renal Perfusion and will need to Renally Adjust Meds -Continue to Monitor and Trend Renal Function carefully and repeat BMP/CMP intermittently   Bowel Regimen -C/w Bisacodyl 10 mg RC Daily PRN moderate  constipation, Miralax 17 grams po Daily, and Senna-Docusate 2 tab po qHS  DVT prophylaxis: enoxaparin (LOVENOX) injection 40 mg Start: 12/26/21 1200    Code Status: Full Code Family Communication: No family  at beside and Natalie Brown is at bedside  Disposition Plan:  Level of care: Med-Surg Status is: Inpatient Remains inpatient appropriate because: Medically stable and has a difficult disposition   Consultants:  Neurology Psychiatry  Procedures:  As delineated as above  Antimicrobials:  Anti-infectives (From admission, onward)    None       Subjective: Seen and examined at bedside and does not speak to me or answer any questions.  Sitting in the chair eating and holding food in her mouth.  Vital signs main stable in no acute changes overnight.  Objective: Vitals:   09/03/22 1742 09/03/22 1915 09/04/22 0456 09/04/22 0705  BP: 96/60 (!) 94/54 93/60 104/88  Pulse: 65 75 66 84  Resp: 16 18 16 18   Temp: 98.1 F (36.7 C) 98.4 F (36.9 C) 98 F (36.7 C) 98.4 F (36.9 C)  TempSrc: Axillary Axillary Axillary   SpO2: 100% 100% 100% 100%  Weight:      Height:        Intake/Output Summary (Last 24 hours) at 09/04/2022 0752 Last data filed at 09/03/2022 1917 Gross per 24 hour  Intake 714 ml  Output --  Net 714 ml   Filed Weights   08/24/22 0417 08/27/22 0500 09/03/22 1610  Weight: 55.8 kg 55.3 kg 54.5 kg   Examination: Physical Exam:  Constitutional: Thin African-American female in no acute distress and does not speak to me Respiratory: Diminished to auscultation bilaterally with some coarse breath sounds, no wheezing, rales, rhonchi or crackles. Normal respiratory effort and patient is not tachypenic. No accessory muscle use.  Cardiovascular: RRR, no murmurs / rubs / gallops. S1 and S2 auscultated.  Abdomen: Soft, non-tender, non-distended. Bowel sounds positive.  GU: Deferred. Musculoskeletal: No clubbing / cyanosis of digits/nails. No joint deformity upper and lower extremities.  Skin: No rashes, lesions, ulcers on a limited skin evaluation. No induration; Warm and dry.  Neurologic: [name] awake but not fully alert and does not interact with me or follow commands and does  not acknowledge me Psychiatric: Impaired judgment and insight  Data Reviewed: I have personally reviewed following labs and imaging studies  CBC: Recent Labs  Lab 08/29/22 1437  WBC 6.3  HGB 15.8*  HCT 46.0  MCV 85.3  PLT 330   Basic Metabolic Panel: Recent Labs  Lab 08/29/22 1437  NA 139  K 4.0  CL 107  CO2 23  GLUCOSE 132*  BUN 17  CREATININE 1.26*  CALCIUM 9.5  MG 2.1   GFR: Estimated Creatinine Clearance: 43.4 mL/min (A) (by C-G formula based on SCr of 1.26 mg/dL (H)). Liver Function Tests: No results for input(s): "AST", "ALT", "ALKPHOS", "BILITOT", "PROT", "ALBUMIN" in the last 168 hours. No results for input(s): "LIPASE", "AMYLASE" in the last 168 hours. No results for input(s): "AMMONIA" in the last 168 hours. Coagulation Profile: No results for input(s): "INR", "PROTIME" in the last 168 hours. Cardiac Enzymes: No results for input(s): "CKTOTAL", "CKMB", "CKMBINDEX", "TROPONINI" in the last 168 hours. BNP (last 3 results) No results for input(s): "PROBNP" in the last 8760 hours. HbA1C: No results for input(s): "HGBA1C" in the last 72 hours. CBG: No results for input(s): "GLUCAP" in the last 168 hours. Lipid Profile: No results for input(s): "CHOL", "HDL", "LDLCALC", "TRIG", "CHOLHDL", "LDLDIRECT" in the last 72  hours. Thyroid Function Tests: No results for input(s): "TSH", "T4TOTAL", "FREET4", "T3FREE", "THYROIDAB" in the last 72 hours. Anemia Panel: No results for input(s): "VITAMINB12", "FOLATE", "FERRITIN", "TIBC", "IRON", "RETICCTPCT" in the last 72 hours. Sepsis Labs: No results for input(s): "PROCALCITON", "LATICACIDVEN" in the last 168 hours.  No results found for this or any previous visit (from the past 240 hour(s)).   Radiology Studies: No results found.  Scheduled Meds:  vitamin B-12  500 mcg Oral Daily   enoxaparin (LOVENOX) injection  40 mg Subcutaneous Q24H   feeding supplement  237 mL Oral Q24H   haloperidol  2 mg Oral BID    hydrocerin   Topical BID   mirtazapine  15 mg Oral QHS   polyethylene glycol  17 g Oral Daily   senna-docusate  2 tablet Oral QHS   Continuous Infusions:   LOS: 252 days   Marguerita Merles, DO [facility] Available via Epic secure chat 7am-7pm After these hours, please refer to coverage provider listed on [email or web address] 09/04/2022, 7:52 AM

## 2022-09-05 DIAGNOSIS — G9341 Metabolic encephalopathy: Secondary | ICD-10-CM | POA: Diagnosis not present

## 2022-09-05 DIAGNOSIS — Z23 Encounter for immunization: Secondary | ICD-10-CM | POA: Diagnosis not present

## 2022-09-05 DIAGNOSIS — E43 Unspecified severe protein-calorie malnutrition: Secondary | ICD-10-CM | POA: Diagnosis not present

## 2022-09-05 DIAGNOSIS — R000 Tachycardia, unspecified: Secondary | ICD-10-CM

## 2022-09-05 DIAGNOSIS — E538 Deficiency of other specified B group vitamins: Secondary | ICD-10-CM

## 2022-09-05 DIAGNOSIS — Z66 Do not resuscitate: Secondary | ICD-10-CM | POA: Diagnosis not present

## 2022-09-05 NOTE — Progress Notes (Addendum)
PROGRESS NOTE    Natalie Brown  ZHY:[id number] DOB: 05-05-1965 DOA: 12/24/2021 PCP: Lavinia Sharps, NP   Brief Narrative:  Ms. Hoey is a 57 year old with a history of bipolar disorder and chronic dementia with advanced cerebral atrophy who was admitted to the acute units 12/24/2021 following a preceding admission at [facility] 09/06/2021-12/24/2021.  She was initially admitted to [facility] with acute mania and aggressive behaviors.  On 12/24/2021 she became nearly catatonic and was transferred to the ER and ultimately admitted.  During her acute hospital stay she has been evaluated by Neurology and Psychiatry.  EEG has revealed no evidence of seizure activity.  Evaluation for pulmonary embolism was also negative as well as the evaluation for DVT.  EF was found to be normal on TTE.  Repeat brain imaging was unremarkable. It was ultimately determined that her AMS was due to due progression of her severe underlying dementia.    The patient has been deemed medically stable and is awaiting a disposition.  The goal is to place her within the memory care unit but this is proving difficult as she is homeless and has no insurance.   Significant events while hospitalized: 09/07/2021: admitted to behavioral health treated with Zyprexa Invega and Ativan challenge for catatonia. 12/24/2021: sent to ER from [facility] for evaluation for tachycardia 9/04-9/5, 9/5>9/9, LTM EEG  no seizure. TSH stable CTA chest no PE no pneumonia RPR nonreactive bilateral LE Doppler negative for DVT Echo with normal EF  9/23 febrile secondary to Covid 19 + no pneumonia. 9/25 repeat CT brain unremarkable. 06/24/2022  psychiatry consulted for increasing agitation   Assessment and Plan:  Acute metabolic encephalopathy on chronic progressive early onset dementia -Patient is most frequently nonverbal and non-interactive - she has been evaluated by Psychiatry and Neurology during this hospitalization with an extensive workup to include MRI of  the brain and EEG both of which were unremarkable  -Continue supportive care continues with use of Haldol and as needed olanzapine, hydroxyzine, lorazepam, and mirtazapine  -Remains medically stable at this time and remains ready for disposition  -Ultimately it is felt that her current mental status is a result of progressive severe early onset dementia and there is no expectation that it would improve beyond its current point but rather will in fact only decline further with time   Unintentional weight loss with inadequate oral intake -Continue mirtazapine - check weight weekly Filed Weights   08/24/22 0417 08/27/22 0500 09/03/22 0812  Weight: 55.8 kg 55.3 kg 54.5 kg  -Nutritionist consulted and recommending    Bipolar Disorder -Continue Haloperidol 2 mg po BID, Olanzapine 5 mg po/IM BIDprn Significant Agitation, Hydroxyzine 25 mg po TIDprn Anxiety, and Lorazepam 0.5 mg q6h as needed  -No significant agitation at this time   Vitamin B12 Deficiency -Corrected with Supplementation and last B12 Level was checked on 06/26/22 and was 1,212 -Currently receiving Cyanocobalamin 500 mcg po Daily   HTN -Not presently requiring medications -Continue to Monitor BP per Protocol -Last BP reading was 94/64   Tobacco Abuse -Nicotine Patch has been discontinued   Tachycardia -Patient had just woken up per Bedside Sitter and was eating and per sitter has not ambulated this AM -Was Tachycardic on Exam and has a Hx of Sinus Tachycardia -Propranolol had to be discontinued due to hypotension/bradycardia this hospitalization -Will order EKG to further evaluate -ECHO done in September 2023 showed "Left Ventricle: Left ventricular ejection fraction, by estimation, is 60 to 65%. The left ventricle has normal  function. The left ventricle has no regional wall motion abnormalities. The left ventricular internal cavity size was normal in size. There is no left ventricular hypertrophy. Left ventricular diastolic  parameters  are consistent with Grade I diastolic dysfunction (impaired relaxation). Right Ventricle: The right ventricular size is normal. No increase in right ventricular wall thickness. Right ventricular systolic function is normal." -Last TSH on 04/01/22 was 0.887 and will likely repeat with next lab draw  -May need bolus or addition of a low dose Beta Blocker again but BP is on the softer side   CKD Stage IIIa -BUN/Cr Trend: Recent Labs  Lab 08/07/22 0940 08/21/22 0500 08/29/22 1437 09/04/22 1152  BUN 15 10 17 15   CREATININE 1.14* 1.24* 1.26* 1.08*  -Avoid Nephrotoxic Medications, Contrast Dyes, Hypotension and Dehydration to Ensure Adequate Renal Perfusion and will need to Renally Adjust Meds -Continue to Monitor and Trend Renal Function carefully and repeat BMP/CMP intermittently    Bowel Regimen -C/w Bisacodyl 10 mg RC Daily PRN moderate constipation, Miralax 17 grams po Daily, and Senna-Docusate 2 tab po qHS   DVT prophylaxis: enoxaparin (LOVENOX) injection 40 mg Start: 12/26/21 1200    Code Status: Full Code Family Communication: No family present at bedside   Disposition Plan:  Level of care: Med-Surg Status is: Inpatient Remains inpatient appropriate because: Medically stable and has a difficult disposition   Consultants:  Neurology Psychiatry  Procedures:  As delineated as above  Antimicrobials:  Anti-infectives (From admission, onward)    None       Subjective: Seen and examined at bedside and per the sitter she had just woken up.  Patient was feeding herself today.  She continues to have cognitive deficits which limits her participation in examination and she does not speak to me or acknowledgment or answering questions.  Does not appear to be in acute distress  Objective: Vitals:   09/03/22 1915 09/04/22 0456 09/04/22 0705 09/04/22 2249  BP: (!) 94/54 93/60 104/88 94/64  Pulse: 75 66 84 68  Resp: 18 16 18    Temp: 98.4 F (36.9 C) 98 F (36.7 C)  98.4 F (36.9 C) 97.7 F (36.5 C)  TempSrc: Axillary Axillary  Axillary  SpO2: 100% 100% 100% 99%  Weight:      Height:        Intake/Output Summary (Last 24 hours) at 09/05/2022 0757 Last data filed at 09/04/2022 1435 Gross per 24 hour  Intake 400 ml  Output --  Net 400 ml   Filed Weights   08/24/22 0417 08/27/22 0500 09/03/22 1308  Weight: 55.8 kg 55.3 kg 54.5 kg   Examination: Physical Exam:  Constitutional: Thin AAF in no acute distress but does not speak to me or acknowledge me given her cognitive deficits Respiratory: Diminished to auscultation bilaterally with coarse breath sounds, no wheezing, rales, rhonchi or crackles. Normal respiratory effort and patient is not tachypenic. No accessory muscle use.  Unlabored breathing Cardiovascular: Tachycardic rate but appears regular on auscultation, no murmurs / rubs / gallops. S1 and S2 auscultated.  Trace extremity edema.  Abdomen: Soft, non-tender, non-distended. Bowel sounds positive.  GU: Deferred. Musculoskeletal: No clubbing / cyanosis of digits/nails. No joint deformity upper and lower extremities.  Skin: No rashes, lesions, ulcers on limited skin evaluation. No induration; Warm and dry.  Neurologic: Remains awake and is feeding herself but does not follow commands and does not acknowledge me or answer questions Psychiatric: Impaired judgment and insight  Data Reviewed: I have personally reviewed following labs and  imaging studies  CBC: Recent Labs  Lab 08/29/22 1437 09/04/22 1152  WBC 6.3 5.7  NEUTROABS  --  3.9  HGB 15.8* 13.7  HCT 46.0 40.9  MCV 85.3 84.9  PLT 330 294   Basic Metabolic Panel: Recent Labs  Lab 08/29/22 1437 09/04/22 1152  NA 139 138  K 4.0 4.1  CL 107 107  CO2 23 22  GLUCOSE 132* 98  BUN 17 15  CREATININE 1.26* 1.08*  CALCIUM 9.5 8.9  MG 2.1 2.2  PHOS  --  3.5   GFR: Estimated Creatinine Clearance: 50.6 mL/min (A) (by C-G formula based on SCr of 1.08 mg/dL (H)). Liver Function  Tests: Recent Labs  Lab 09/04/22 1152  AST 37  ALT 23  ALKPHOS 56  BILITOT 0.4  PROT 6.8  ALBUMIN 3.6   No results for input(s): "LIPASE", "AMYLASE" in the last 168 hours. No results for input(s): "AMMONIA" in the last 168 hours. Coagulation Profile: No results for input(s): "INR", "PROTIME" in the last 168 hours. Cardiac Enzymes: No results for input(s): "CKTOTAL", "CKMB", "CKMBINDEX", "TROPONINI" in the last 168 hours. BNP (last 3 results) No results for input(s): "PROBNP" in the last 8760 hours. HbA1C: No results for input(s): "HGBA1C" in the last 72 hours. CBG: No results for input(s): "GLUCAP" in the last 168 hours. Lipid Profile: No results for input(s): "CHOL", "HDL", "LDLCALC", "TRIG", "CHOLHDL", "LDLDIRECT" in the last 72 hours. Thyroid Function Tests: No results for input(s): "TSH", "T4TOTAL", "FREET4", "T3FREE", "THYROIDAB" in the last 72 hours. Anemia Panel: No results for input(s): "VITAMINB12", "FOLATE", "FERRITIN", "TIBC", "IRON", "RETICCTPCT" in the last 72 hours. Sepsis Labs: No results for input(s): "PROCALCITON", "LATICACIDVEN" in the last 168 hours.  No results found for this or any previous visit (from the past 240 hour(s)).   Radiology Studies: No results found.  Scheduled Meds:  vitamin B-12  500 mcg Oral Daily   enoxaparin (LOVENOX) injection  40 mg Subcutaneous Q24H   feeding supplement  237 mL Oral Q24H   haloperidol  2 mg Oral BID   hydrocerin   Topical BID   mirtazapine  15 mg Oral QHS   polyethylene glycol  17 g Oral Daily   senna-docusate  2 tablet Oral QHS   Continuous Infusions:   LOS: 253 days   Marguerita Merles, DO [facility] Available via Epic secure chat 7am-7pm After these hours, please refer to coverage provider listed on [email or web address] 09/05/2022, 7:57 AM

## 2022-09-05 NOTE — TOC Progression Note (Addendum)
Transition of Care [facility]) - Progression Note    Patient Details  Name: Natalie Brown MRN: [id number] Date of Birth: 01/17/1966  Transition of Care [facility]) CM/SW Contact  Inis Sizer, LCSW Phone Number: 09/05/2022, 1:56 PM  Clinical Narrative:    CSW spoke with Natalie Brown of financial counseling who states she has spoken with patient's mother who will go to DSS tomorrow to apply for Special Assistance Medicaid. This specific type of Medicaid is required to be considered for placement at [facility]  CSW spoke with patient's mother Natalie Brown who states she went to DSS this morning to complete the [facility] application. Natalie Brown will leave a document with patient's RN and CSW will obtain form, complete it, and return it to DSS.  Barriers to Discharge: Financial Resources, Inadequate or no insurance, Homeless with medical needs  Expected Discharge Plan and Services In-house Referral: Clinical Social Work   Post Acute Care Choice: Nursing Home Living arrangements for the past 2 months: Homeless [facility]) Expected Discharge Date: 12/24/21                                     Social Determinants of Health (SDOH) Interventions SDOH Screenings   Food Insecurity: No Food Insecurity (03/05/2022)  Housing: High Risk (03/21/2022)  Alcohol Screen: Low Risk  (09/06/2021)  Depression (PHQ2-9): Medium Risk (05/26/2020)  Tobacco Use: High Risk (03/21/2022)    Readmission Risk Interventions     No data to display

## 2022-09-06 DIAGNOSIS — Z66 Do not resuscitate: Secondary | ICD-10-CM | POA: Diagnosis not present

## 2022-09-06 DIAGNOSIS — E43 Unspecified severe protein-calorie malnutrition: Secondary | ICD-10-CM | POA: Diagnosis not present

## 2022-09-06 DIAGNOSIS — Z23 Encounter for immunization: Secondary | ICD-10-CM | POA: Diagnosis not present

## 2022-09-06 DIAGNOSIS — G9341 Metabolic encephalopathy: Secondary | ICD-10-CM | POA: Diagnosis not present

## 2022-09-06 MED ORDER — METOPROLOL TARTRATE 12.5 MG HALF TABLET: 12.5000 mg | ORAL_TABLET | Freq: Two times a day (BID) | ORAL | Status: DC

## 2022-09-06 MED ORDER — METOPROLOL TARTRATE 25 MG PO TABS
25.0000 mg | ORAL_TABLET | Freq: Two times a day (BID) | ORAL | Status: DC
  Administered 2022-09-06 – 2022-09-22 (×15): 25 mg via ORAL
  Filled 2022-09-06 (×29): qty 1

## 2022-09-06 MED ORDER — SODIUM CHLORIDE 0.9 % IV BOLUS: 500.0000 mL | Freq: Once | INTRAVENOUS | Status: DC

## 2022-09-06 NOTE — Progress Notes (Signed)
PROGRESS NOTE    Natalie Brown  ZOX:[id number] DOB: [birth year] DOA: 12/24/2021 PCP: Lavinia Sharps, NP   Brief Narrative:  Natalie Brown is a 57 year old with a history of bipolar disorder and chronic dementia with advanced cerebral atrophy who was admitted to the acute units 12/24/2021 following a preceding admission at [facility] 09/06/2021-12/24/2021.  She was initially admitted to [facility] with acute mania and aggressive behaviors.  On 12/24/2021 she became nearly catatonic and was transferred to the ER and ultimately admitted.  During her acute hospital stay she has been evaluated by Neurology and Psychiatry.  EEG has revealed no evidence of seizure activity.  Evaluation for pulmonary embolism was also negative as well as the evaluation for DVT.  EF was found to be normal on TTE.  Repeat brain imaging was unremarkable. It was ultimately determined that her AMS was due to due progression of her severe underlying dementia.    The patient has been deemed medically stable and is awaiting a disposition.  The goal is to place her within the memory care unit but this is proving difficult as she is homeless and has no insurance.   Significant events while hospitalized: 09/07/2021: admitted to behavioral health treated with Zyprexa Invega and Ativan challenge for catatonia. 12/24/2021: sent to ER from [facility] for evaluation for tachycardia 9/04-9/5, 9/5>9/9, LTM EEG  no seizure. TSH stable CTA chest no PE no pneumonia RPR nonreactive bilateral LE Doppler negative for DVT Echo with normal EF  9/23 febrile secondary to Covid 19 + no pneumonia. 9/25 repeat CT brain unremarkable. 06/24/2022  psychiatry consulted for increasing agitation   Assessment and Plan:  Acute Metabolic Encephalopathy on chronic progressive early onset dementia -Patient is most frequently nonverbal and non-interactive - she has been evaluated by Psychiatry and Neurology during this hospitalization with an extensive workup to include MRI of  the brain and EEG both of which were unremarkable  -Continue supportive care continues with use of Haldol and as needed olanzapine, hydroxyzine, lorazepam, and mirtazapine  -Remains medically stable at this time and remains ready for disposition  -Ultimately it is felt that her current mental status is a result of progressive severe early onset dementia and there is no expectation that it would improve beyond its current point but rather will in fact only decline further with time   Unintentional weight loss with inadequate oral intake -Continue mirtazapine - check weight weekly Filed Weights   08/24/22 0417 08/27/22 0500 09/03/22 0812  Weight: 55.8 kg 55.3 kg 54.5 kg   -Nutritionist consulted and recommending    Bipolar Disorder -Continue Haloperidol 2 mg po BID, Olanzapine 5 mg po/IM BIDprn Significant Agitation, Hydroxyzine 25 mg po TIDprn Anxiety, and Lorazepam 0.5 mg q6h as needed  -No significant agitation at this time   Vitamin B12 Deficiency -Corrected with Supplementation and last B12 Level was checked on 06/26/22 and was 1,212 -Currently receiving Cyanocobalamin 500 mcg po Daily   HTN -Not presently requiring medications -Continue to Monitor BP per Protocol -Last BP reading was 94/64   Tobacco Abuse -Nicotine Patch has been discontinued    Tachycardia, improved  -Patient had Sinus Tachycardia of 131 yesterday -Was Tachycardic on Exam and has a Hx of Sinus Tachycardia -Propranolol had to be discontinued due to hypotension/bradycardia this hospitalization -Will order EKG to further evaluate and confirmed; repeat EKG showed improvement after BB -ECHO done in September 2023 showed "Left Ventricle: Left ventricular ejection fraction, by estimation, is 60 to 65%. The left ventricle has normal  function. The left ventricle has no regional wall motion abnormalities. The left ventricular internal cavity size was normal in size. There is no left ventricular hypertrophy. Left ventricular  diastolic parameters  are consistent with Grade I diastolic dysfunction (impaired relaxation). Right Ventricle: The right ventricular size is normal. No increase in right ventricular wall thickness. Right ventricular systolic function is normal." -Last TSH on 04/01/22 was 0.887 and will likely repeat with next lab draw  -Reinitiated BB with Metoprolol Tartrate 25 mg po BID on 09/06/22   CKD Stage IIIa -BUN/Cr Trend: Recent Labs  Lab 08/21/22 0500 08/29/22 1437 09/04/22 1152  BUN 10 17 15   CREATININE 1.24* 1.26* 1.08*  -Avoid Nephrotoxic Medications, Contrast Dyes, Hypotension and Dehydration to Ensure Adequate Renal Perfusion and will need to Renally Adjust Meds -Continue to Monitor and Trend Renal Function carefully and repeat BMP/CMP intermittently    Bowel Regimen -C/w Bisacodyl 10 mg RC Daily PRN moderate constipation, Miralax 17 grams po Daily, and Senna-Docusate 2 tab po qHS   DVT prophylaxis: enoxaparin (LOVENOX) injection 40 mg Start: 12/26/21 1200    Code Status: Full Code Family Communication: No family present at bedside   Disposition Plan:  Level of care: Med-Surg Status is: Inpatient Remains inpatient appropriate because: Medically stable and has a difficult disposition    Consultants:  Neurology Psychiatry  Procedures:  As delineated as above  Antimicrobials:  Anti-infectives (From admission, onward)    None       Subjective: Seen examined at bedside and she was resting and was sitting up in the bed asleep during the entire encounter.  Did not wake or acknowledge me.  Tech at bedside states that she has been sleeping most of the morning.  Heart rate is improved after beta-blocker.  Objective: Vitals:   09/04/22 2249 09/05/22 2023 09/06/22 1039 09/06/22 1316  BP: 94/64 (!) 161/78 119/78   Pulse: 68  94 87  Resp:  17 17   Temp: 97.7 F (36.5 C) 98.2 F (36.8 C) 97.6 F (36.4 C)   TempSrc: Axillary  Axillary   SpO2: 99% 100% 100%   Weight:       Height:        Intake/Output Summary (Last 24 hours) at 09/06/2022 1328 Last data filed at 09/06/2022 1610 Gross per 24 hour  Intake --  Output 0 ml  Net 0 ml   Filed Weights   08/24/22 0417 08/27/22 0500 09/03/22 0812  Weight: 55.8 kg 55.3 kg 54.5 kg   Examination: Physical Exam:  Constitutional: Thin African-American female in no acute distress in this somnolent and asleep and drowsy and continues to not speak to me or acknowledge me Respiratory: Diminished to auscultation bilaterally, no wheezing, rales, rhonchi or crackles. Normal respiratory effort and patient is not tachypenic. No accessory muscle use.  Unlabored breathing Cardiovascular: RRR, no murmurs / rubs / gallops. S1 and S2 auscultated. No extremity edema. Abdomen: Soft, non-tender, non-distended. Bowel sounds positive.  GU: Deferred. Musculoskeletal: No clubbing / cyanosis of digits/nails. No joint deformity upper and lower extremities.  Skin: No rashes, lesions, ulcers on a  limited skin evaluation. No induration; Warm and dry.  Neurologic: Somnolent and drowsy and does not follow commands Psychiatric: Impaired judgment and insight due to her current condition  Data Reviewed: I have personally reviewed following labs and imaging studies  CBC: Recent Labs  Lab 09/04/22 1152  WBC 5.7  NEUTROABS 3.9  HGB 13.7  HCT 40.9  MCV 84.9  PLT 294   Basic  Metabolic Panel: Recent Labs  Lab 09/04/22 1152  NA 138  K 4.1  CL 107  CO2 22  GLUCOSE 98  BUN 15  CREATININE 1.08*  CALCIUM 8.9  MG 2.2  PHOS 3.5   GFR: Estimated Creatinine Clearance: 50.6 mL/min (A) (by C-G formula based on SCr of 1.08 mg/dL (H)). Liver Function Tests: Recent Labs  Lab 09/04/22 1152  AST 37  ALT 23  ALKPHOS 56  BILITOT 0.4  PROT 6.8  ALBUMIN 3.6   No results for input(s): "LIPASE", "AMYLASE" in the last 168 hours. No results for input(s): "AMMONIA" in the last 168 hours. Coagulation Profile: No results for input(s):  "INR", "PROTIME" in the last 168 hours. Cardiac Enzymes: No results for input(s): "CKTOTAL", "CKMB", "CKMBINDEX", "TROPONINI" in the last 168 hours. BNP (last 3 results) No results for input(s): "PROBNP" in the last 8760 hours. HbA1C: No results for input(s): "HGBA1C" in the last 72 hours. CBG: No results for input(s): "GLUCAP" in the last 168 hours. Lipid Profile: No results for input(s): "CHOL", "HDL", "LDLCALC", "TRIG", "CHOLHDL", "LDLDIRECT" in the last 72 hours. Thyroid Function Tests: No results for input(s): "TSH", "T4TOTAL", "FREET4", "T3FREE", "THYROIDAB" in the last 72 hours. Anemia Panel: No results for input(s): "VITAMINB12", "FOLATE", "FERRITIN", "TIBC", "IRON", "RETICCTPCT" in the last 72 hours. Sepsis Labs: No results for input(s): "PROCALCITON", "LATICACIDVEN" in the last 168 hours.  No results found for this or any previous visit (from the past 240 hour(s)).   Radiology Studies: No results found.  Scheduled Meds:  vitamin B-12  500 mcg Oral Daily   enoxaparin (LOVENOX) injection  40 mg Subcutaneous Q24H   feeding supplement  237 mL Oral Q24H   haloperidol  2 mg Oral BID   hydrocerin   Topical BID   metoprolol tartrate  25 mg Oral BID   mirtazapine  15 mg Oral QHS   polyethylene glycol  17 g Oral Daily   senna-docusate  2 tablet Oral QHS   Continuous Infusions:  sodium chloride      LOS: 254 days   Marguerita Merles, DO [facility] Available via Epic secure chat 7am-7pm After these hours, please refer to coverage provider listed on [email or web address] 09/06/2022, 1:28 PM

## 2022-09-07 NOTE — TOC Progression Note (Signed)
Transition of Care [facility]) - Progression Note    Patient Details  Name: Natalie Brown MRN: [id number] Date of Birth: 06-Dec-1965  [facility]) CM/SW Contact  Natalie Sizer, LCSW Phone Number: 09/07/2022, 1:22 PM  Clinical Narrative:    CSW spoke with patient's mother at bedside to complete forms needed for Special Assistance Medicaid application. Patient's sitter also present and stated patient ate 100% of her breakfast - patient's lunch at bedside but patient currently sleeping. [name] reports she wants to speak with an MD regarding [name]'s medications. All questions answered, [name] to contact CSW as needs arise.  CSW notified Dr. Marland Mcalpine of request.     Barriers to Discharge: Financial Resources, Inadequate or no insurance, Homeless with medical needs  Expected Discharge Plan and Services In-house Referral: Clinical Social Work   Post Acute Care Choice: Nursing Home Living arrangements for the past 2 months: Homeless [facility]) Expected Discharge Date: 12/24/21                                     Social Determinants of Health (SDOH) Interventions SDOH Screenings   Food Insecurity: No Food Insecurity (03/05/2022)  Housing: High Risk (03/21/2022)  Alcohol Screen: Low Risk  (09/06/2021)  Depression (PHQ2-9): Medium Risk (05/26/2020)  Tobacco Use: High Risk (03/21/2022)    Readmission Risk Interventions     No data to display

## 2022-09-07 NOTE — Progress Notes (Signed)
PROGRESS NOTE    Natalie Brown  WUJ:[id number] DOB: 1965/09/22 DOA: 12/24/2021 PCP: Lavinia Sharps, NP   Brief Narrative:  Natalie Brown is a 57 year old with a history of bipolar disorder and chronic dementia with advanced cerebral atrophy who was admitted to the acute units 12/24/2021 following a preceding admission at [facility] 09/06/2021-12/24/2021.  She was initially admitted to [facility] with acute mania and aggressive behaviors.  On 12/24/2021 she became nearly catatonic and was transferred to the ER and ultimately admitted.  During her acute hospital stay she has been evaluated by Neurology and Psychiatry.  EEG has revealed no evidence of seizure activity.  Evaluation for pulmonary embolism was also negative as well as the evaluation for DVT.  EF was found to be normal on TTE.  Repeat brain imaging was unremarkable. It was ultimately determined that her AMS was due to due progression of her severe underlying dementia.    The patient has been deemed medically stable and is awaiting a disposition.  The goal is to place her within the memory care unit but this is proving difficult as she is homeless and has no insurance.  Per TOC patient's mother will go to DSS tomorrow to apply for special assistance Medicaid given that this is a type of Medicaid that is required to be considered for placement at [facility].   Significant events while hospitalized: 09/07/2021: admitted to behavioral health treated with Zyprexa Invega and Ativan challenge for catatonia. 12/24/2021: sent to ER from [facility] for evaluation for tachycardia 9/04-9/5, 9/5>9/9, LTM EEG  no seizure. TSH stable CTA chest no PE no pneumonia RPR nonreactive bilateral LE Doppler negative for DVT Echo with normal EF  9/23 febrile secondary to Covid 19 + no pneumonia. 9/25 repeat CT brain unremarkable. 06/24/2022  psychiatry consulted for increasing agitation    Assessment and Plan: Acute Metabolic Encephalopathy on chronic progressive early  onset dementia -Patient is most frequently nonverbal and non-interactive - she has been evaluated by Psychiatry and Neurology during this hospitalization with an extensive workup to include MRI of the brain and EEG both of which were unremarkable  -Continue supportive care continues with use of Haldol and as needed olanzapine, hydroxyzine, lorazepam, and mirtazapine  -Remains medically stable at this time and remains ready for disposition and difficult to place -Ultimately it is felt that her current mental status is a result of progressive severe early onset dementia and there is no expectation that it would improve beyond its current point but rather will in fact only decline further with time   Unintentional weight loss with inadequate oral intake -Continue mirtazapine - check weight weekly Filed Weights   08/24/22 0417 08/27/22 0500 09/03/22 0812  Weight: 55.8 kg 55.3 kg 54.5 kg  -Nutritionist consulted and recommending    Bipolar Disorder -Continue Haloperidol 2 mg po BID, Olanzapine 5 mg po/IM BIDprn Significant Agitation, Hydroxyzine 25 mg po TIDprn Anxiety, and Lorazepam 0.5 mg q6h as needed  -No significant agitation at this time   Vitamin B12 Deficiency -Corrected with Supplementation and last B12 Level was checked on 06/26/22 and was 1,212 -Currently receiving Cyanocobalamin 500 mcg po Daily   HTN -Not presently requiring medications -Continue to Monitor BP per Protocol -Last BP reading was 104/83   Tobacco Abuse -Nicotine Patch has been discontinued    Tachycardia, improved  -Patient had Sinus Tachycardia of 131 yesterday -Was Tachycardic on Exam and has a Hx of Sinus Tachycardia -Propranolol had to be discontinued due to hypotension/bradycardia this hospitalization -  Will order EKG to further evaluate and confirmed; repeat EKG showed improvement after BB -ECHO done in September 2023 showed "Left Ventricle: Left ventricular ejection fraction, by estimation, is 60 to 65%.  The left ventricle has normal function. The left ventricle has no regional wall motion abnormalities. The left ventricular internal cavity size was normal in size. There is no left ventricular hypertrophy. Left ventricular diastolic parameters  are consistent with Grade I diastolic dysfunction (impaired relaxation). Right Ventricle: The right ventricular size is normal. No increase in right ventricular wall thickness. Right ventricular systolic function is normal." -Last TSH on 04/01/22 was 0.887 and will likely repeat with next lab draw  -Reinitiated BB with Metoprolol Tartrate 25 mg po BID on 09/06/22   CKD Stage IIIa -BUN/Cr Trend: Recent Labs  Lab 08/21/22 0500 08/29/22 1437 09/04/22 1152  BUN 10 17 15   CREATININE 1.24* 1.26* 1.08*  -Avoid Nephrotoxic Medications, Contrast Dyes, Hypotension and Dehydration to Ensure Adequate Renal Perfusion and will need to Renally Adjust Meds -Continue to Monitor and Trend Renal Function carefully and repeat BMP/CMP intermittently    Bowel Regimen -C/w Bisacodyl 10 mg RC Daily PRN moderate constipation, Miralax 17 grams po Daily, and Senna-Docusate 2 tab po qHS   DVT prophylaxis: enoxaparin (LOVENOX) injection 40 mg Start: 12/26/21 1200    Code Status: Full Code Family Communication: No family present at bedside  Disposition Plan:  Level of care: Med-Surg Status is: Inpatient Remains inpatient appropriate because: Medically appears stable but has a difficult disposition   Consultants:  Neurology Psychiatry  Procedures:  As delineated as above  Antimicrobials:  Anti-infectives (From admission, onward)    None       Subjective: Seen and examined at bedside and she is resting would not speak to me.  Per the sitter at bedside she has been ambulating.  Continues to have cognitive deficits and does not speak to me acknowledge me or answer questions but looks at me there does not appear to be any acute distress  Objective: Vitals:    09/05/22 2023 09/06/22 1039 09/06/22 1316 09/07/22 0428  BP: (!) 161/78 119/78  104/83  Pulse:  94 87 96  Resp: 17 17  17   Temp: 98.2 F (36.8 C) 97.6 F (36.4 C)  98.3 F (36.8 C)  TempSrc:  Axillary    SpO2: 100% 100%  100%  Weight:      Height:        Intake/Output Summary (Last 24 hours) at 09/07/2022 0865 Last data filed at 09/07/2022 0428 Gross per 24 hour  Intake 120 ml  Output 0 ml  Net 120 ml   Filed Weights   08/24/22 0417 08/27/22 0500 09/03/22 0812  Weight: 55.8 kg 55.3 kg 54.5 kg   Examination: Physical Exam:  Constitutional: Thin African-American female in no acute distress appears calm but does not speak to me or acknowledge me given her cognitive deficits and does look at me when I spoke to her. Respiratory: Diminished to auscultation bilaterally with some coarse breath sounds, no wheezing, rales, rhonchi or crackles. Normal respiratory effort and patient is not tachypenic. No accessory muscle use.  Unlabored breathing Cardiovascular: RRR, no murmurs / rubs / gallops. S1 and S2 auscultated.  Minimal extremity edema Abdomen: Soft, non-tender, non-distended.  Bowel sounds positive.  GU: Deferred. Musculoskeletal: No clubbing / cyanosis of digits/nails. No joint deformity upper and lower extremities. Skin: No rashes, lesions, ulcers limited skin evaluation. No induration; Warm and dry.  Neurologic: CN 2-12 grossly intact with  no focal deficits. Romberg sign and cerebellar reflexes not assessed.  Psychiatric: Normal judgment and insight. Alert and oriented x 3. Normal mood and appropriate affect.   Data Reviewed: I have personally reviewed following labs and imaging studies  CBC: Recent Labs  Lab 09/04/22 1152  WBC 5.7  NEUTROABS 3.9  HGB 13.7  HCT 40.9  MCV 84.9  PLT 294   Basic Metabolic Panel: Recent Labs  Lab 09/04/22 1152  NA 138  K 4.1  CL 107  CO2 22  GLUCOSE 98  BUN 15  CREATININE 1.08*  CALCIUM 8.9  MG 2.2  PHOS 3.5    GFR: Estimated Creatinine Clearance: 50.6 mL/min (A) (by C-G formula based on SCr of 1.08 mg/dL (H)). Liver Function Tests: Recent Labs  Lab 09/04/22 1152  AST 37  ALT 23  ALKPHOS 56  BILITOT 0.4  PROT 6.8  ALBUMIN 3.6   No results for input(s): "LIPASE", "AMYLASE" in the last 168 hours. No results for input(s): "AMMONIA" in the last 168 hours. Coagulation Profile: No results for input(s): "INR", "PROTIME" in the last 168 hours. Cardiac Enzymes: No results for input(s): "CKTOTAL", "CKMB", "CKMBINDEX", "TROPONINI" in the last 168 hours. BNP (last 3 results) No results for input(s): "PROBNP" in the last 8760 hours. HbA1C: No results for input(s): "HGBA1C" in the last 72 hours. CBG: No results for input(s): "GLUCAP" in the last 168 hours. Lipid Profile: No results for input(s): "CHOL", "HDL", "LDLCALC", "TRIG", "CHOLHDL", "LDLDIRECT" in the last 72 hours. Thyroid Function Tests: No results for input(s): "TSH", "T4TOTAL", "FREET4", "T3FREE", "THYROIDAB" in the last 72 hours. Anemia Panel: No results for input(s): "VITAMINB12", "FOLATE", "FERRITIN", "TIBC", "IRON", "RETICCTPCT" in the last 72 hours. Sepsis Labs: No results for input(s): "PROCALCITON", "LATICACIDVEN" in the last 168 hours.  No results found for this or any previous visit (from the past 240 hour(s)).   Radiology Studies: No results found.  Scheduled Meds:  vitamin B-12  500 mcg Oral Daily   enoxaparin (LOVENOX) injection  40 mg Subcutaneous Q24H   feeding supplement  237 mL Oral Q24H   haloperidol  2 mg Oral BID   hydrocerin   Topical BID   metoprolol tartrate  25 mg Oral BID   mirtazapine  15 mg Oral QHS   polyethylene glycol  17 g Oral Daily   senna-docusate  2 tablet Oral QHS   Continuous Infusions:  sodium chloride      LOS: 255 days   Marguerita Merles, DO [facility] Available via Epic secure chat 7am-7pm After these hours, please refer to coverage provider listed on  [email or web address] 09/07/2022, 8:11 AM

## 2022-09-08 NOTE — Progress Notes (Signed)
PROGRESS NOTE    Natalie Brown  ZOX:[id number] DOB: 1966-01-05 DOA: 12/24/2021 PCP: Lavinia Sharps, NP   Brief Narrative:  Ms. Psomas is a 57 year old with a history of bipolar disorder and chronic dementia with advanced cerebral atrophy who was admitted to the acute units 12/24/2021 following a preceding admission at [facility] 09/06/2021-12/24/2021.  She was initially admitted to [facility] with acute mania and aggressive behaviors.  On 12/24/2021 she became nearly catatonic and was transferred to the ER and ultimately admitted.  During her acute hospital stay she has been evaluated by Neurology and Psychiatry.  EEG has revealed no evidence of seizure activity.  Evaluation for pulmonary embolism was also negative as well as the evaluation for DVT.  EF was found to be normal on TTE.  Repeat brain imaging was unremarkable. It was ultimately determined that her AMS was due to due progression of her severe underlying dementia.    The patient has been deemed medically stable and is awaiting a disposition.  The goal is to place her within the memory care unit but this is proving difficult as she is homeless and has no insurance.  Per TOC patient's mother will go to DSS tomorrow to apply for special assistance Medicaid given that this is a type of Medicaid that is required to be considered for placement at [facility].   Significant events while hospitalized: 09/07/2021: admitted to behavioral health treated with Zyprexa Invega and Ativan challenge for catatonia. 12/24/2021: sent to ER from [facility] for evaluation for tachycardia 9/04-9/5, 9/5>9/9, LTM EEG  no seizure. TSH stable CTA chest no PE no pneumonia RPR nonreactive bilateral LE Doppler negative for DVT Echo with normal EF  9/23 febrile secondary to Covid 19 + no pneumonia. 9/25 repeat CT brain unremarkable. 06/24/2022  psychiatry consulted for increasing agitation   Assessment and Plan:  Acute Metabolic Encephalopathy on chronic progressive early  onset Dementia -Patient is most frequently nonverbal and non-interactive - she has been evaluated by Psychiatry and Neurology during this hospitalization with an extensive workup to include MRI of the brain and EEG both of which were unremarkable  -Continue supportive care continues with use of Haldol and as needed olanzapine, hydroxyzine, lorazepam, and mirtazapine  -Remains medically stable at this time and remains ready for disposition and difficult to place -Ultimately it is felt that her current mental status is a result of progressive severe early onset dementia and there is no expectation that it would improve beyond its current point but rather will in fact only decline further with time   Unintentional weight loss with inadequate oral intake -Continue mirtazapine - check weight weekly  Filed Weights   08/24/22 0417 08/27/22 0500 09/03/22 0812  Weight: 55.8 kg 55.3 kg 54.5 kg  -Nutritionist consulted and recommending    Bipolar Disorder -Continue Haloperidol 2 mg po BID, Olanzapine 5 mg po/IM BIDprn Significant Agitation, Hydroxyzine 25 mg po TIDprn Anxiety, and Lorazepam 0.5 mg q6h as needed  -No significant agitation at this time   Vitamin B12 Deficiency -Corrected with Supplementation and last B12 Level was checked on 06/26/22 and was 1,212 -Currently receiving Cyanocobalamin 500 mcg po Daily   HTN -Not presently requiring medications -Continue to Monitor BP per Protocol -Last BP reading was 119/65   Tobacco Abuse -Nicotine Patch has been discontinued    Tachycardia, improved  -Patient had Sinus Tachycardia of 131 yesterday -Was Tachycardic on Exam and has a Hx of Sinus Tachycardia -Propranolol had to be discontinued due to hypotension/bradycardia this  hospitalization -Will order EKG to further evaluate and confirmed; repeat EKG showed improvement after BB -ECHO done in September 2023 showed "Left Ventricle: Left ventricular ejection fraction, by estimation, is 60 to 65%.  The left ventricle has normal function. The left ventricle has no regional wall motion abnormalities. The left ventricular internal cavity size was normal in size. There is no left ventricular hypertrophy. Left ventricular diastolic parameters  are consistent with Grade I diastolic dysfunction (impaired relaxation). Right Ventricle: The right ventricular size is normal. No increase in right ventricular wall thickness. Right ventricular systolic function is normal." -Last TSH on 04/01/22 was 0.887 and will likely repeat with next lab draw  -Reinitiated BB with Metoprolol Tartrate 25 mg po BID on 09/06/22   CKD Stage IIIa -BUN/Cr Trend: Recent Labs  Lab 08/21/22 0500 08/29/22 1437 09/04/22 1152  BUN 10 17 15   CREATININE 1.24* 1.26* 1.08*  -Avoid Nephrotoxic Medications, Contrast Dyes, Hypotension and Dehydration to Ensure Adequate Renal Perfusion and will need to Renally Adjust Meds -Continue to Monitor and Trend Renal Function carefully and repeat BMP/CMP intermittently    Bowel Regimen -C/w Bisacodyl 10 mg RC Daily PRN moderate constipation, Miralax 17 grams po Daily, and Senna-Docusate 2 tab po qHS    DVT prophylaxis: enoxaparin (LOVENOX) injection 40 mg Start: 12/26/21 1200    Code Status: Full Code Family Communication: No family currently at bedside  Disposition Plan:  Level of care: Med-Surg Status is: Inpatient Remains inpatient appropriate because: She appears medically stable but has a very difficult disposition    Consultants:  Neurology Psychiatry  Procedures:  As delineated as above  Antimicrobials:  Anti-infectives (From admission, onward)    None       Subjective: Seen and examined at bedside and she is more awake and about to eat and continues not to speak with me and have significant cognitive deficits.  Tried to push me away when I try to listen to her heart and lungs given that she wanted to eat.  No family at bedside no acute issues per  nursing  Objective: Vitals:   09/06/22 1316 09/07/22 0428 09/07/22 2346 09/08/22 0531  BP:  104/83 98/69 119/65  Pulse: 87 96 77 62  Resp:  17 18 16   Temp:  98.3 F (36.8 C)  98.4 F (36.9 C)  TempSrc:      SpO2:  100% 100% 100%  Weight:      Height:        Intake/Output Summary (Last 24 hours) at 09/08/2022 0801 Last data filed at 09/07/2022 2346 Gross per 24 hour  Intake 180 ml  Output --  Net 180 ml   Filed Weights   08/24/22 0417 08/27/22 0500 09/03/22 5409  Weight: 55.8 kg 55.3 kg 54.5 kg   Examination: Physical Exam:  Constitutional: Thin African-American female in no acute distress appears calm and does not acknowledge me or speak to me given her cognitive deficits but is trying to sit up to eat her food Respiratory: Diminished to auscultation bilaterally, no wheezing, rales, rhonchi or crackles. Normal respiratory effort and patient is not tachypenic. No accessory muscle use.  Unlabored breathing Cardiovascular: RRR, no murmurs / rubs / gallops. S1 and S2 auscultated.  Mild lower extremity edema Abdomen: Soft, non-tender, non-distended. Bowel sounds positive.  GU: Deferred. Musculoskeletal: No clubbing / cyanosis of digits/nails. No joint deformity upper and lower extremities.  Skin: No rashes, lesions, ulcers on limited skin evaluation. No induration; Warm and dry.  Neurologic: Moves upper extremities independently  but does not acknowledge me or want to participate in neuro examination. Psychiatric: Impaired judgment and insight and she is awake and has progressive dementia  Data Reviewed: I have personally reviewed following labs and imaging studies  CBC: Recent Labs  Lab 09/04/22 1152  WBC 5.7  NEUTROABS 3.9  HGB 13.7  HCT 40.9  MCV 84.9  PLT 294   Basic Metabolic Panel: Recent Labs  Lab 09/04/22 1152  NA 138  K 4.1  CL 107  CO2 22  GLUCOSE 98  BUN 15  CREATININE 1.08*  CALCIUM 8.9  MG 2.2  PHOS 3.5   GFR: Estimated Creatinine Clearance:  50.6 mL/min (A) (by C-G formula based on SCr of 1.08 mg/dL (H)). Liver Function Tests: Recent Labs  Lab 09/04/22 1152  AST 37  ALT 23  ALKPHOS 56  BILITOT 0.4  PROT 6.8  ALBUMIN 3.6   No results for input(s): "LIPASE", "AMYLASE" in the last 168 hours. No results for input(s): "AMMONIA" in the last 168 hours. Coagulation Profile: No results for input(s): "INR", "PROTIME" in the last 168 hours. Cardiac Enzymes: No results for input(s): "CKTOTAL", "CKMB", "CKMBINDEX", "TROPONINI" in the last 168 hours. BNP (last 3 results) No results for input(s): "PROBNP" in the last 8760 hours. HbA1C: No results for input(s): "HGBA1C" in the last 72 hours. CBG: No results for input(s): "GLUCAP" in the last 168 hours. Lipid Profile: No results for input(s): "CHOL", "HDL", "LDLCALC", "TRIG", "CHOLHDL", "LDLDIRECT" in the last 72 hours. Thyroid Function Tests: No results for input(s): "TSH", "T4TOTAL", "FREET4", "T3FREE", "THYROIDAB" in the last 72 hours. Anemia Panel: No results for input(s): "VITAMINB12", "FOLATE", "FERRITIN", "TIBC", "IRON", "RETICCTPCT" in the last 72 hours. Sepsis Labs: No results for input(s): "PROCALCITON", "LATICACIDVEN" in the last 168 hours.  No results found for this or any previous visit (from the past 240 hour(s)).   Radiology Studies: No results found.  Scheduled Meds:  vitamin B-12  500 mcg Oral Daily   enoxaparin (LOVENOX) injection  40 mg Subcutaneous Q24H   feeding supplement  237 mL Oral Q24H   haloperidol  2 mg Oral BID   hydrocerin   Topical BID   metoprolol tartrate  25 mg Oral BID   mirtazapine  15 mg Oral QHS   polyethylene glycol  17 g Oral Daily   senna-docusate  2 tablet Oral QHS   Continuous Infusions:  sodium chloride      LOS: 256 days   Marguerita Merles, DO [facility] Available via Epic secure chat 7am-7pm After these hours, please refer to coverage provider listed on [email or web address] 09/08/2022, 8:01 AM

## 2022-09-09 NOTE — Progress Notes (Signed)
PROGRESS NOTE    Natalie Brown  UEA:[id number] DOB: 04/16/1966 DOA: 12/24/2021 PCP: Lavinia Sharps, NP   Brief Narrative:  Natalie Brown is a 57 year old with a history of bipolar disorder and chronic dementia with advanced cerebral atrophy who was admitted to the acute units 12/24/2021 following a preceding admission at [facility] 09/06/2021-12/24/2021.  She was initially admitted to [facility] with acute mania and aggressive behaviors.  On 12/24/2021 she became nearly catatonic and was transferred to the ER and ultimately admitted.  During her acute hospital stay she has been evaluated by Neurology and Psychiatry.  EEG has revealed no evidence of seizure activity.  Evaluation for pulmonary embolism was also negative as well as the evaluation for DVT.  EF was found to be normal on TTE.  Repeat brain imaging was unremarkable. It was ultimately determined that her AMS was due to due progression of her severe underlying dementia.    The patient has been deemed medically stable and is awaiting a disposition.  The goal is to place her within the memory care unit but this is proving difficult as she is homeless and has no insurance.  Per TOC patient's mother will go to DSS tomorrow to apply for special assistance Medicaid given that this is a type of Medicaid that is required to be considered for placement at [place] [facility].   Significant events while hospitalized: 09/07/2021: admitted to behavioral health treated with Zyprexa Invega and Ativan challenge for catatonia. 12/24/2021: sent to ER from [facility] for evaluation for tachycardia 9/04-9/5, 9/5>9/9, LTM EEG  no seizure. TSH stable CTA chest no PE no pneumonia RPR nonreactive bilateral LE Doppler negative for DVT Echo with normal EF  9/23 febrile secondary to Covid 19 + no pneumonia. 9/25 repeat CT brain unremarkable. 06/24/2022  psychiatry consulted for increasing agitation   Assessment and Plan:  Acute Metabolic Encephalopathy on chronic progressive early  onset Dementia -Patient is most frequently nonverbal and non-interactive - she has been evaluated by Psychiatry and Neurology during this hospitalization with an extensive workup to include MRI of the brain and EEG both of which were unremarkable  -Continue supportive care continues with use of Haldol and as needed olanzapine, hydroxyzine, lorazepam, and mirtazapine  -Remains medically stable at this time and remains ready for disposition and difficult to place -Ultimately it is felt that her current mental status is a result of progressive severe early onset dementia and there is no expectation that it would improve beyond its current point but rather will in fact only decline further with time   Unintentional weight loss with inadequate oral intake -Continue mirtazapine - check weight weekly  Filed Weights   08/24/22 0417 08/27/22 0500 09/03/22 0812  Weight: 55.8 kg 55.3 kg 54.5 kg   -Nutritionist consulted and recommending    Bipolar Disorder -Continue Haloperidol 2 mg po BID, Olanzapine 5 mg po/IM BIDprn Significant Agitation, Hydroxyzine 25 mg po TIDprn Anxiety, and Lorazepam 0.5 mg q6h as needed  -No significant agitation at this time   Vitamin B12 Deficiency -Corrected with Supplementation and last B12 Level was checked on 06/26/22 and was 1,212 -Currently receiving Cyanocobalamin 500 mcg po Daily   HTN -Not presently requiring medications -Continue to Monitor BP per Protocol -Last BP reading was 114/69   Tobacco Abuse -Nicotine Patch has been discontinued    Tachycardia, improved  -Patient had Sinus Tachycardia of 131 yesterday -Was Tachycardic on Exam and has a Hx of Sinus Tachycardia -Propranolol had to be discontinued due to hypotension/bradycardia  this hospitalization -Will order EKG to further evaluate and confirmed; repeat EKG showed improvement after BB -ECHO done in September 2023 showed "Left Ventricle: Left ventricular ejection fraction, by estimation, is 60 to 65%.  The left ventricle has normal function. The left ventricle has no regional wall motion abnormalities. The left ventricular internal cavity size was normal in size. There is no left ventricular hypertrophy. Left ventricular diastolic parameters  are consistent with Grade I diastolic dysfunction (impaired relaxation). Right Ventricle: The right ventricular size is normal. No increase in right ventricular wall thickness. Right ventricular systolic function is normal." -Last TSH on 04/01/22 was 0.887 and will likely repeat with next lab draw  -Reinitiated BB with Metoprolol Tartrate 25 mg po BID on 09/06/22   CKD Stage IIIa -BUN/Cr Trend: Recent Labs  Lab 08/21/22 0500 08/29/22 1437 09/04/22 1152  BUN 10 17 15   CREATININE 1.24* 1.26* 1.08*  -Avoid Nephrotoxic Medications, Contrast Dyes, Hypotension and Dehydration to Ensure Adequate Renal Perfusion and will need to Renally Adjust Meds -Continue to Monitor and Trend Renal Function carefully and repeat BMP/CMP intermittently    Bowel Regimen -C/w Bisacodyl 10 mg RC Daily PRN moderate constipation, Miralax 17 grams po Daily, and Senna-Docusate 2 tab po qHS    DVT prophylaxis: enoxaparin (LOVENOX) injection 40 mg Start: 12/26/21 1200    Code Status: Full Code Family Communication:   Disposition Plan:  Level of care: Med-Surg Status is: Inpatient Remains inpatient appropriate because: She appears medically stable but has a very difficult disposition    Consultants:  Neurology Psychiatry  Procedures:  As delineated as above  Antimicrobials:  Anti-infectives (From admission, onward)    None       Subjective: Seen and examined at bedside and she just ambulated in the hallway and was wanting to rest.  Still does not acknowledge me and continues not to speak to me given her significant cognitive deficits.  No other concerns or complaints at this time.  Objective: Vitals:   09/08/22 0531 09/08/22 1000 09/08/22 2252 09/09/22 0747   BP: 119/65 (!) 119/94 107/79 114/69  Pulse: 62 100 97 77  Resp: 16 16 16 18   Temp: 98.4 F (36.9 C)   98.5 F (36.9 C)  TempSrc:    Oral  SpO2: 100% 99%  100%  Weight:      Height:        Intake/Output Summary (Last 24 hours) at 09/09/2022 0819 Last data filed at 09/08/2022 1500 Gross per 24 hour  Intake 597 ml  Output --  Net 597 ml   Filed Weights   08/24/22 0417 08/27/22 0500 09/03/22 0812  Weight: 55.8 kg 55.3 kg 54.5 kg   Examination: Physical Exam:  Constitutional: African-American female in no acute distress and continues to not do myelotomy or speak to me given her cognitive deficits.  [name] laying in the bed Respiratory: Diminished to auscultation bilaterally, no wheezing, rales, rhonchi or crackles. Normal respiratory effort and patient is not tachypenic. No accessory muscle use.  Unlabored breathing Cardiovascular: RRR, no murmurs / rubs / gallops. S1 and S2 auscultated.  No appreciable extremity edema Abdomen: Soft, non-tender, non-distended.  Bowel sounds positive.  GU: Deferred. Musculoskeletal: No clubbing / cyanosis of digits/nails. No joint deformity upper and lower extremities. Skin: No rashes, lesions, ulcers on limited skin evaluation. No induration; Warm and dry.  Neurologic: Moves extremities independently but does not acknowledge more to participate in neuro examination Psychiatric: Continues to have impaired judgment and insight and she is awake  but not fully alert and asked for a progressive dementia  Data Reviewed: I have personally reviewed following labs and imaging studies  CBC: Recent Labs  Lab 09/04/22 1152  WBC 5.7  NEUTROABS 3.9  HGB 13.7  HCT 40.9  MCV 84.9  PLT 294   Basic Metabolic Panel: Recent Labs  Lab 09/04/22 1152  NA 138  K 4.1  CL 107  CO2 22  GLUCOSE 98  BUN 15  CREATININE 1.08*  CALCIUM 8.9  MG 2.2  PHOS 3.5   GFR: Estimated Creatinine Clearance: 50.6 mL/min (A) (by C-G formula based on SCr of 1.08 mg/dL  (H)). Liver Function Tests: Recent Labs  Lab 09/04/22 1152  AST 37  ALT 23  ALKPHOS 56  BILITOT 0.4  PROT 6.8  ALBUMIN 3.6   No results for input(s): "LIPASE", "AMYLASE" in the last 168 hours. No results for input(s): "AMMONIA" in the last 168 hours. Coagulation Profile: No results for input(s): "INR", "PROTIME" in the last 168 hours. Cardiac Enzymes: No results for input(s): "CKTOTAL", "CKMB", "CKMBINDEX", "TROPONINI" in the last 168 hours. BNP (last 3 results) No results for input(s): "PROBNP" in the last 8760 hours. HbA1C: No results for input(s): "HGBA1C" in the last 72 hours. CBG: No results for input(s): "GLUCAP" in the last 168 hours. Lipid Profile: No results for input(s): "CHOL", "HDL", "LDLCALC", "TRIG", "CHOLHDL", "LDLDIRECT" in the last 72 hours. Thyroid Function Tests: No results for input(s): "TSH", "T4TOTAL", "FREET4", "T3FREE", "THYROIDAB" in the last 72 hours. Anemia Panel: No results for input(s): "VITAMINB12", "FOLATE", "FERRITIN", "TIBC", "IRON", "RETICCTPCT" in the last 72 hours. Sepsis Labs: No results for input(s): "PROCALCITON", "LATICACIDVEN" in the last 168 hours.  No results found for this or any previous visit (from the past 240 hour(s)).   Radiology Studies: No results found.  Scheduled Meds:  vitamin B-12  500 mcg Oral Daily   enoxaparin (LOVENOX) injection  40 mg Subcutaneous Q24H   feeding supplement  237 mL Oral Q24H   haloperidol  2 mg Oral BID   hydrocerin   Topical BID   metoprolol tartrate  25 mg Oral BID   mirtazapine  15 mg Oral QHS   polyethylene glycol  17 g Oral Daily   senna-docusate  2 tablet Oral QHS   Continuous Infusions:  sodium chloride      LOS: 257 days   Marguerita Merles, DO [facility] Available via Epic secure chat 7am-7pm After these hours, please refer to coverage provider listed on [email or web address] 09/09/2022, 8:19 AM

## 2022-09-10 NOTE — Progress Notes (Signed)
Initial Nutrition Assessment  DOCUMENTATION CODES:   Not applicable  INTERVENTION:  - Continue Dys 3 diet.   - Discontinue Ensure Enlive po q day  NUTRITION DIAGNOSIS:   Altered GI function related to constipation as evidenced by other (comment) (last BM 10 days ago).  GOAL:   Other (Comment) (more frequent bowel movements)   MONITOR:   I & O's  REASON FOR ASSESSMENT:   LOS    ASSESSMENT:   57 y.o. female admits related to AMS. PMH includes: Bipolar disorde rand HTN. Pt is currently receiving medical management related to early onset dementia with behavioral disturbances.  Meds reviewed: Vit B12, remeron, miralax, senokot. Labs reviewed: WDL.   Per record, pt has been eating 60-100% of her meals over the past 7 days. The pt is meeting her needs at this time. Pt has been refusing supplement. RD will discontinue. Will continue to monitor POC and PO intakes.   Diet Order:   Diet Order             DIET DYS 3 Room service appropriate? Yes; Fluid consistency: Thin  Diet effective now                   EDUCATION NEEDS:   Not appropriate for education at this time  Skin:  Skin Assessment: Reviewed RN Assessment  Last BM:  5/21  Height:   Ht Readings from Last 1 Encounters:  02/03/22 5\' 6"  (1.676 m)    Weight:   Wt Readings from Last 1 Encounters:  09/03/22 54.5 kg    Ideal Body Weight:     BMI:  Body mass index is 19.39 kg/m.  Estimated Nutritional Needs:   Kcal:  1800-2160 kcals  Protein:  90-105 gm  Fluid:  >/= 1.8 L  Bethann Humble, RD, LDN, CNSC.

## 2022-09-10 NOTE — Progress Notes (Signed)
PROGRESS NOTE    Natalie Brown  ZOX:[id number] DOB: 1965/08/10 DOA: 12/24/2021 PCP: Lavinia Sharps, NP   Brief Narrative:  Ms. Piontek is a 57 year old with a history of bipolar disorder and chronic dementia with advanced cerebral atrophy who was admitted to the acute units 12/24/2021 following a preceding admission at [facility] 09/06/2021-12/24/2021.  She was initially admitted to [facility] with acute mania and aggressive behaviors.  On 12/24/2021 she became nearly catatonic and was transferred to the ER and ultimately admitted.  During her acute hospital stay she has been evaluated by Neurology and Psychiatry.  EEG has revealed no evidence of seizure activity.  Evaluation for pulmonary embolism was also negative as well as the evaluation for DVT.  EF was found to be normal on TTE.  Repeat brain imaging was unremarkable. It was ultimately determined that her AMS was due to due progression of her severe underlying dementia.    The patient has been deemed medically stable and is awaiting a disposition.  The goal is to place her within the memory care unit but this is proving difficult as she is homeless and has no insurance.  Per TOC patient's mother will go to DSS tomorrow to apply for special assistance Medicaid given that this is a type of Medicaid that is required to be considered for placement at [place] [facility].   Significant events while hospitalized: 09/07/2021: admitted to behavioral health treated with Zyprexa Invega and Ativan challenge for catatonia. 12/24/2021: sent to ER from [facility] for evaluation for tachycardia 9/04-9/5, 9/5>9/9, LTM EEG  no seizure. TSH stable CTA chest no PE no pneumonia RPR nonreactive bilateral LE Doppler negative for DVT Echo with normal EF  9/23 febrile secondary to Covid 19 + no pneumonia. 9/25 repeat CT brain unremarkable. 06/24/2022  psychiatry consulted for increasing agitation    Assessment and Plan:  Acute Metabolic Encephalopathy on chronic progressive early  onset Dementia -Patient is most frequently nonverbal and non-interactive - she has been evaluated by Psychiatry and Neurology during this hospitalization with an extensive workup to include MRI of the brain and EEG both of which were unremarkable  -Continue supportive care continues with use of Haldol and as needed olanzapine, hydroxyzine, lorazepam, and mirtazapine  -Remains medically stable at this time and remains ready for disposition and difficult to place -Ultimately it is felt that her current mental status is a result of progressive severe early onset dementia and there is no expectation that it would improve beyond its current point but rather will in fact only decline further with time   Unintentional weight loss with inadequate oral intake -Continue mirtazapine - check weight weekly  Filed Weights   08/24/22 0417 08/27/22 0500 09/03/22 0812  Weight: 55.8 kg 55.3 kg 54.5 kg  -Nutritionist consulted and recommending continuing Dyphagia 3 diet and have now discontinued Ensure Enlive po daily given that patient has been refusing supplements   Bipolar Disorder -Continue Haloperidol 2 mg po BID, Olanzapine 5 mg po/IM BIDprn Significant Agitation, Hydroxyzine 25 mg po TIDprn Anxiety, and Lorazepam 0.5 mg q6h as needed  -No significant agitation at this time   Vitamin B12 Deficiency -Corrected with Supplementation and last B12 Level was checked on 06/26/22 and was 1,212 -Currently receiving Cyanocobalamin 500 mcg po Daily   HTN -Not presently requiring medications -Continue to Monitor BP per Protocol -Last BP reading was 107/89   Tobacco Abuse -Nicotine Patch has been discontinued    Sinus Tachycardia -Intermittent-Was Tachycardic on Exam today and has a Hx  of Sinus Tachycardia -Propranolol had to be discontinued due to hypotension/bradycardia this hospitalization -Will order EKG to further evaluate and confirmed; repeat EKG showed improvement after BB -ECHO done in September 2023  showed "Left Ventricle: Left ventricular ejection fraction, by estimation, is 60 to 65%. The left ventricle has normal function. The left ventricle has no regional wall motion abnormalities. The left ventricular internal cavity size was normal in size. There is no left ventricular hypertrophy. Left ventricular diastolic parameters  are consistent with Grade I diastolic dysfunction (impaired relaxation). Right Ventricle: The right ventricular size is normal. No increase in right ventricular wall thickness. Right ventricular systolic function is normal." -Last TSH on 04/01/22 was 0.887 and will likely repeat with next lab draw  -Reinitiated BB with Metoprolol Tartrate 25 mg po BID on 09/06/22 and if remains tachycardic will uptitrate the dose    CKD Stage IIIa -BUN/Cr Trend: Recent Labs  Lab 08/21/22 0500 08/29/22 1437 09/04/22 1152  BUN 10 17 15   CREATININE 1.24* 1.26* 1.08*  -Avoid Nephrotoxic Medications, Contrast Dyes, Hypotension and Dehydration to Ensure Adequate Renal Perfusion and will need to Renally Adjust Meds -Continue to Monitor and Trend Renal Function carefully and repeat BMP/CMP intermittently    Bowel Regimen -C/w Bisacodyl 10 mg RC Daily PRN moderate constipation, Miralax 17 grams po Daily, and Senna-Docusate 2 tab po qHS    DVT prophylaxis: enoxaparin (LOVENOX) injection 40 mg Start: 12/26/21 1200    Code Status: Full Code Family Communication: No family present at bedside   Disposition Plan:  Level of care: Med-Surg Status is: Inpatient Remains inpatient appropriate because:  She appears medically stable but has a very difficult disposition    Consultants:  Neurology Psychiatry  Procedures:  As delineated as above  Antimicrobials:  Anti-infectives (From admission, onward)    None       Subjective: Seen and examined at bedside and she had gotten out of bed and was walking with a walker try to move the marker from the white board.  Still did not talk to me  but try to head me the marker to open it for her.  No safety sitter at bedside and continues to not verbalize anything to me or interact with me much.  Objective: Vitals:   09/08/22 2252 09/09/22 0747 09/09/22 1633 09/10/22 0425  BP: 107/79 114/69 93/64 107/89  Pulse: 97 77 72 63  Resp: 16 18 18 17   Temp:  98.5 F (36.9 C) (!) 97.5 F (36.4 C) 98.2 F (36.8 C)  TempSrc:  Oral    SpO2:  100% 100% 99%  Weight:      Height:       No intake or output data in the 24 hours ending 09/10/22 1518 Filed Weights   08/24/22 0417 08/27/22 0500 09/03/22 0812  Weight: 55.8 kg 55.3 kg 54.5 kg   Examination: Physical Exam:  Constitutional: African-American female who is in no acute distress but continues not to speak to me given her cognitive deficits.  [name] up and out of the bed and standing at the wall attempting to remove the marker Respiratory: Diminished to auscultation bilaterally with some coarse breath sounds, no wheezing, rales, rhonchi or crackles. Normal respiratory effort and patient is not tachypenic. No accessory muscle use.  Unlabored breathing Cardiovascular: RRR, no murmurs / rubs / gallops. S1 and S2 auscultated. No extremity edema.  Abdomen: Soft, non-tender, non-distended. Bowel sounds positive.  GU: Deferred. Musculoskeletal: No clubbing / cyanosis of digits/nails. No joint deformity upper  and lower extremities. Skin: No rashes, lesions, ulcers on a limited skin evaluation. No induration; Warm and dry.  Neurologic: Moves extremities independently and ambulates on her own but does not speak to me and does not really want to follow commands Psychiatric: Continues have impaired judgment and insight and she is awake but not fully alert due to her progressive dementia.  Data Reviewed: I have personally reviewed following labs and imaging studies  CBC: Recent Labs  Lab 09/04/22 1152  WBC 5.7  NEUTROABS 3.9  HGB 13.7  HCT 40.9  MCV 84.9  PLT 294   Basic Metabolic  Panel: Recent Labs  Lab 09/04/22 1152  NA 138  K 4.1  CL 107  CO2 22  GLUCOSE 98  BUN 15  CREATININE 1.08*  CALCIUM 8.9  MG 2.2  PHOS 3.5   GFR: Estimated Creatinine Clearance: 50.6 mL/min (A) (by C-G formula based on SCr of 1.08 mg/dL (H)). Liver Function Tests: Recent Labs  Lab 09/04/22 1152  AST 37  ALT 23  ALKPHOS 56  BILITOT 0.4  PROT 6.8  ALBUMIN 3.6   No results for input(s): "LIPASE", "AMYLASE" in the last 168 hours. No results for input(s): "AMMONIA" in the last 168 hours. Coagulation Profile: No results for input(s): "INR", "PROTIME" in the last 168 hours. Cardiac Enzymes: No results for input(s): "CKTOTAL", "CKMB", "CKMBINDEX", "TROPONINI" in the last 168 hours. BNP (last 3 results) No results for input(s): "PROBNP" in the last 8760 hours. HbA1C: No results for input(s): "HGBA1C" in the last 72 hours. CBG: No results for input(s): "GLUCAP" in the last 168 hours. Lipid Profile: No results for input(s): "CHOL", "HDL", "LDLCALC", "TRIG", "CHOLHDL", "LDLDIRECT" in the last 72 hours. Thyroid Function Tests: No results for input(s): "TSH", "T4TOTAL", "FREET4", "T3FREE", "THYROIDAB" in the last 72 hours. Anemia Panel: No results for input(s): "VITAMINB12", "FOLATE", "FERRITIN", "TIBC", "IRON", "RETICCTPCT" in the last 72 hours. Sepsis Labs: No results for input(s): "PROCALCITON", "LATICACIDVEN" in the last 168 hours.  No results found for this or any previous visit (from the past 240 hour(s)).   Radiology Studies: No results found.  Scheduled Meds:  vitamin B-12  500 mcg Oral Daily   enoxaparin (LOVENOX) injection  40 mg Subcutaneous Q24H   haloperidol  2 mg Oral BID   hydrocerin   Topical BID   metoprolol tartrate  25 mg Oral BID   mirtazapine  15 mg Oral QHS   polyethylene glycol  17 g Oral Daily   senna-docusate  2 tablet Oral QHS   Continuous Infusions:  sodium chloride      LOS: 258 days   Marguerita Merles, DO [facility] Available  via Epic secure chat 7am-7pm After these hours, please refer to coverage provider listed on [email or web address] 09/10/2022, 3:18 PM

## 2022-09-11 NOTE — Progress Notes (Signed)
PROGRESS NOTE  Natalie Brown YQM:[id number] DOB: 11-Mar-1966   PCP: Lavinia Sharps, NP  Patient is from: Homeless  DOA: 12/24/2021 LOS: 259  Chief complaints No chief complaint on file.    Brief Narrative / Interim history: 57 year old with a history of bipolar disorder and chronic dementia with advanced cerebral atrophy who was admitted to the acute units 12/24/2021 following a preceding admission at [facility] 09/06/2021-12/24/2021. She was initially admitted to [facility] with acute mania and aggressive behaviors. On 12/24/2021 she became nearly catatonic and was transferred to the ER and ultimately admitted. During her acute hospital stay she has been evaluated by Neurology and Psychiatry. EEG has revealed no evidence of seizure activity. Repeat brain imaging was unremarkable. It was ultimately determined that her AMS was due to due progression of her severe underlying dementia.   The patient has been deemed medically stable and is awaiting a disposition. The goal is to place her within the memory care unit but this is proving difficult as she is homeless and has no insurance. Per TOC patient's mother will go to DSS to apply for special assistance Medicaid given that this is a type of Medicaid that is required to be considered for placement at [facility].    Significant events while hospitalized: 09/07/2021: admitted to behavioral health treated with Zyprexa Invega and Ativan challenge for catatonia. 12/24/2021: sent to ER from [facility] for evaluation for tachycardia 9/04-9/5, 9/5>9/9, LTM EEG  no seizure.  9/23 febrile secondary to Covid 19 + no pneumonia. 9/25 repeat CT brain unremarkable. 06/24/2022  psychiatry consulted for increasing agitation    Subjective: Seen and examined earlier this morning.  No major events overnight of this morning.  Remains confused.  Objective: Vitals:   09/10/22 0425 09/10/22 2100 09/11/22 0553 09/11/22 1035  BP: 107/89 115/74 134/84 118/64  Pulse: 63 77  79 84  Resp: 17 18 16    Temp: 98.2 F (36.8 C) 98.2 F (36.8 C) 98.4 F (36.9 C)   TempSrc:  Oral Oral   SpO2: 99% 97% 100%   Weight:      Height:        Examination:  GENERAL: No apparent distress.  Nontoxic. HEENT: MMM.  Vision and hearing grossly intact.  NECK: Supple.  No apparent JVD.  RESP:  No IWOB.  Fair aeration bilaterally. CVS:  RRR. Heart sounds normal.  ABD/GI/GU: BS+. Abd soft, NTND.  MSK/EXT:  Moves extremities. No apparent deformity. No edema.  SKIN: no apparent skin lesion or wound NEURO: Awake, alert and oriented.  Not talking.  Confused.  Does not follow commands. PSYCH: Confused and restless.   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  Evaluated by neurology and psych, and had extensive workup without explanation. Ultimately it is felt that her current mental status is a result of progressive severe early onset dementia and there is no expectation that it would improve beyond its current point but rather will in fact only decline further with time -Continue Haldol and as needed olanzapine, hydroxyzine, lorazepam, and mirtazapine  -Remains medically stable  at this time and remains ready for disposition and difficult to place -Patent attorney. -Continue dysphagia 3 diet.  Bipolar Disorder -Psych meds as above.  Vitamin B12 Deficiency: Resolved.   HTN: Normotensive.  Not on medications.   Tobacco Abuse -Nicotine Patch has been discontinued    Sinus Tachycardia: Resolved.   TTE reassuring.  TSH normal. -Continue metoprolol   CKD Stage IIIa: Stable.  Constipation/altered GI function -Bowel regimen Body mass index is 19.39 kg/m. Nutrition Problem: Altered GI function Etiology: constipation Signs/Symptoms: other (comment) (last BM 10 days ago) Interventions: Other (Comment) (scheduled bowel regimen per MD)   DVT prophylaxis:  enoxaparin (LOVENOX) injection 40 mg Start: 12/26/21 1200  Code Status: Full code Family Communication: None at bedside Level of care: Med-Surg Status is: Inpatient Remains inpatient appropriate because: Lack of placement   Final disposition: Memory care? Consultants:  Neurology Psychiatry  35 minutes with more than 50% spent in reviewing records, counseling patient/family and coordinating care.   Sch Meds:  Scheduled Meds:  vitamin B-12  500 mcg Oral Daily   enoxaparin (LOVENOX) injection  40 mg Subcutaneous Q24H   haloperidol  2 mg Oral BID   hydrocerin   Topical BID   metoprolol tartrate  25 mg Oral BID   mirtazapine  15 mg Oral QHS   polyethylene glycol  17 g Oral Daily   senna-docusate  2 tablet Oral QHS   Continuous Infusions:  sodium chloride     PRN Meds:.acetaminophen, alum & mag hydroxide-simeth, bisacodyl, fluticasone, hydrOXYzine, LORazepam, magnesium hydroxide, OLANZapine **OR** OLANZapine, ondansetron, mouth rinse  Antimicrobials: Anti-infectives (From admission, onward)    None        I have personally reviewed the following labs and images: CBC: No results for input(s): "WBC", "NEUTROABS", "HGB", "HCT", "MCV", "PLT" in the last 168 hours. BMP &GFR No results for input(s): "NA", "K", "CL", "CO2", "GLUCOSE", "BUN", "CREATININE", "CALCIUM", "MG", "PHOS" in the last 168 hours.  Invalid input(s): "GFRCG" Estimated Creatinine Clearance: 50.6 mL/min (A) (by C-G formula based on SCr of 1.08 mg/dL (H)). Liver & Pancreas: No results for input(s): "AST", "ALT", "ALKPHOS", "BILITOT", "PROT",  "ALBUMIN" in the last 168 hours. No results for input(s): "LIPASE", "AMYLASE" in the last 168 hours. No results for input(s): "AMMONIA" in the last 168 hours. Diabetic: No results for input(s): "HGBA1C" in the last 72 hours. No results for input(s): "GLUCAP" in the last 168 hours. Cardiac Enzymes: No results for input(s): "CKTOTAL", "CKMB", "CKMBINDEX", "TROPONINI" in the last 168 hours. No results for input(s): "PROBNP" in the last 8760 hours. Coagulation Profile: No results for input(s): "INR", "PROTIME" in the last 168 hours. Thyroid Function Tests: No results for input(s): "TSH", "T4TOTAL", "FREET4", "T3FREE", "THYROIDAB" in the last 72 hours. Lipid Profile: No results for input(s): "CHOL", "HDL", "LDLCALC", "TRIG", "CHOLHDL", "LDLDIRECT" in the last 72 hours. Anemia Panel: No results for input(s): "VITAMINB12", "FOLATE", "FERRITIN", "TIBC", "IRON", "RETICCTPCT" in the last 72 hours. Urine analysis:    Component Value Date/Time   COLORURINE YELLOW 06/28/2022 0630   APPEARANCEUR CLEAR 06/28/2022 0630   LABSPEC 1.021 06/28/2022 0630   PHURINE 5.0 06/28/2022 0630   GLUCOSEU NEGATIVE 06/28/2022 0630   HGBUR NEGATIVE 06/28/2022 0630   BILIRUBINUR NEGATIVE 06/28/2022 0630   BILIRUBINUR neg 08/07/2016 1526   KETONESUR NEGATIVE 06/28/2022 0630   PROTEINUR NEGATIVE 06/28/2022 0630   UROBILINOGEN 2.0 (A) 08/07/2016 1526   UROBILINOGEN 1 12/30/2013 1216   NITRITE NEGATIVE 06/28/2022 0630   LEUKOCYTESUR NEGATIVE 06/28/2022 0630   Sepsis Labs:  Invalid input(s): "PROCALCITONIN", "LACTICIDVEN"  Microbiology: No results found for this or any previous visit (from the past 240 hour(s)).  Radiology Studies: No results found.    Natalie Kimmey T. Lachele Lievanos [facility]  If 7PM-7AM, please contact night-coverage [email or web address] 09/11/2022, 12:06 PM

## 2022-09-11 NOTE — Plan of Care (Signed)

## 2022-09-12 DIAGNOSIS — E538 Deficiency of other specified B group vitamins: Secondary | ICD-10-CM

## 2022-09-12 DIAGNOSIS — R000 Tachycardia, unspecified: Secondary | ICD-10-CM

## 2022-09-12 NOTE — [place] FL2 (Addendum)
[place] MEDICAID FL2 LEVEL OF CARE FORM     IDENTIFICATION  Patient Name: Natalie Brown Birthdate: [birth year] Sex: female Admission Date (Current Location): 12/24/2021  [place] and [place] Number:  [facility] Facility and Address:  [facility], [address], [name], [place] [address]      Provider Number: [id number]  Attending Physician Name and Address:  Almon Hercules, MD  Relative Name and Phone Number:       Current Level of Care: Hospital Recommended Level of Care: Skilled Nursing Facility - locked unit, memory care Prior Approval Number:    Date Approved/Denied:   PASRR Number:    Discharge Plan: Other (Comment) [facility])    Current Diagnoses: Patient Active Problem List   Diagnosis Date Noted   B12 deficiency 02/19/2022   COVID-19 virus infection 02/19/2022   Drooling 02/19/2022   Essential hypertension 02/19/2022   Altered mental state 12/26/2021   Sinus tachycardia 12/25/2021   Tobacco abuse 12/24/2021   Delirium due to multiple etiologies, acute, hyperactive 12/24/2021   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) 11/11/2021   Bipolar affective disorder, current episode manic with psychotic symptoms (HCC) 09/12/2021   Dental caries 06/29/2015   Cervical cancer screening 02/22/2014   Current smoker 02/22/2014   Urge incontinence of urine 12/30/2013   Need for prophylactic vaccination and inoculation against influenza 12/30/2013   Bipolar 1 disorder (HCC) 12/30/2013    Orientation RESPIRATION BLADDER Height & Weight     Self  Normal Continent (Briefs) Weight: 120 lb 2.4 oz (54.5 kg) Height:  5\' 6"  (167.6 cm)  BEHAVIORAL SYMPTOMS/MOOD NEUROLOGICAL BOWEL NUTRITION STATUS  Other (Comment), Wanderer (Dementia, no behaviors or aggression)   Continent (Briefs) Diet (See discharge summary)  AMBULATORY STATUS COMMUNICATION OF NEEDS Skin   Supervision Verbally Normal                        Personal Care Assistance Level of Assistance  Bathing, Feeding, Dressing Bathing Assistance: Maximum assistance Feeding assistance: Maximum assistance Dressing Assistance: Maximum assistance     Functional Limitations Info  Sight, Speech, Hearing Sight Info: Adequate Hearing Info: Adequate Speech Info: Adequate    SPECIAL CARE FACTORS FREQUENCY                       Contractures Contractures Info: Not present    Additional Factors Info  Code Status, Allergies Code Status Info: Full Code Allergies Info: Penicillins Psychotropic Info: Ativan         Current Medications (09/12/2022):  This is the current hospital active medication list Current Facility-Administered Medications  Medication Dose Route Frequency Provider Last Rate Last Admin   acetaminophen (TYLENOL) tablet 650 mg  650 mg Oral Q6H PRN Orland Mustard, MD   650 mg at 08/15/22 2102   alum & mag hydroxide-simeth (MAALOX/MYLANTA) 200-200-20 MG/5ML suspension 30 mL  30 mL Oral Q4H PRN Orland Mustard, MD       bisacodyl (DULCOLAX) suppository 10 mg  10 mg Rectal Daily PRN Kc, Dayna Barker, MD       cyanocobalamin (VITAMIN B12) tablet 500 mcg  500 mcg Oral Daily Hazeline Junker B, MD   500 mcg at 09/12/22 1318   enoxaparin (LOVENOX) injection 40 mg  40 mg Subcutaneous Q24H Maretta Bees, MD   40 mg at 09/12/22 1319   fluticasone (FLONASE) 50 MCG/ACT nasal spray 1 spray  1 spray Each Nare Daily PRN [name] Flock,  Revonda Standard, MD       haloperidol (HALDOL) tablet 2 mg  2 mg Oral BID [place], Eric J, DO   2 mg at 09/12/22 2956   hydrocerin (EUCERIN) cream   Topical BID Orland Mustard, MD   Given at 09/11/22 2106   hydrOXYzine (ATARAX) tablet 25 mg  25 mg Oral TID PRN Leatha Gilding, MD   25 mg at 09/07/22 0428   LORazepam (ATIVAN) tablet 0.5 mg  0.5 mg Oral Q6H PRN Leatha Gilding, MD   0.5 mg at 09/12/22 2130   magnesium hydroxide (MILK OF MAGNESIA) suspension 30 mL  30 mL Oral Daily PRN Orland Mustard, MD       metoprolol  tartrate (LOPRESSOR) tablet 25 mg  25 mg Oral BID Sheikh, Omair Latif, DO   25 mg at 09/11/22 1036   mirtazapine (REMERON) tablet 15 mg  15 mg Oral QHS Hazeline Junker B, MD   15 mg at 09/11/22 2039   OLANZapine (ZYPREXA) tablet 5 mg  5 mg Oral BID PRN Cinderella, Margaret A   5 mg at 06/27/22 0731   Or   OLANZapine (ZYPREXA) injection 5 mg  5 mg Intramuscular BID PRN Cinderella, Margaret A       ondansetron (ZOFRAN) tablet 4 mg  4 mg Oral Q8H PRN Orland Mustard, MD   4 mg at 03/17/22 0139   Oral care mouth rinse  15 mL Mouth Rinse PRN Leroy Sea, MD       polyethylene glycol (MIRALAX / GLYCOLAX) packet 17 g  17 g Oral Daily Pokhrel, Laxman, MD   17 g at 09/12/22 1319   senna-docusate (Senokot-S) tablet 2 tablet  2 tablet Oral QHS Leatha Gilding, MD   2 tablet at 09/11/22 2039   sodium chloride 0.9 % bolus 500 mL  500 mL Intravenous Once Merlene Laughter, DO         Discharge Medications: Please see discharge summary for a list of discharge medications.  Relevant Imaging Results:  Relevant Lab Results:   Additional Information SSN: [id number]  Inis Sizer, [place]

## 2022-09-12 NOTE — Progress Notes (Signed)
Patient found in room across hall from her room standing over patient with a fork in her hand.  Patient redirected to her own room.  No sitter available for patient today.

## 2022-09-12 NOTE — Progress Notes (Signed)
Patient medicated with prn xanax at this time for agitation.  Patient found in room across the hallway attempting to get into the bed occupied by another patient.  Patient redirected to her own room and medication given.  No sitter available for patient today.

## 2022-09-12 NOTE — TOC Progression Note (Signed)
Transition of Care [facility]) - Progression Note    Patient Details  Name: Natalie Brown MRN: [id number] Date of Birth: 1965/07/30  Transition of Care [facility]) CM/SW Contact  Inis Sizer, LCSW Phone Number: 09/12/2022, 1:48 PM  Clinical Narrative:    There are no updates regarding discharge planning at this time.  CSW spoke with Meriam Sprague of financial counseling who states she is working with the patient's mom to complete the special assistance Medicaid application and an updated FL2 is required. CSW completed new FL2 to assist.     Barriers to Discharge: Financial Resources, Inadequate or no insurance, Homeless with medical needs  Expected Discharge Plan and Services In-house Referral: Clinical Social Work   Post Acute Care Choice: Nursing Home Living arrangements for the past 2 months: Homeless [facility]) Expected Discharge Date: 12/24/21                                     Social Determinants of Health (SDOH) Interventions SDOH Screenings   Food Insecurity: No Food Insecurity (03/05/2022)  Housing: High Risk (03/21/2022)  Alcohol Screen: Low Risk  (09/06/2021)  Depression (PHQ2-9): Medium Risk (05/26/2020)  Tobacco Use: High Risk (03/21/2022)    Readmission Risk Interventions     No data to display

## 2022-09-12 NOTE — Progress Notes (Signed)
PROGRESS NOTE  Natalie Brown ZOX:[id number] DOB: 07/06/1965   PCP: Lavinia Sharps, NP  Patient is from: Homeless  DOA: 12/24/2021 LOS: 260  Chief complaints No chief complaint on file.    Brief Narrative / Interim history: 57 year old with a history of bipolar disorder and chronic dementia with advanced cerebral atrophy who was admitted to the acute units 12/24/2021 following a preceding admission at [facility] 09/06/2021-12/24/2021. She was initially admitted to [name] with acute mania and aggressive behaviors. On 12/24/2021 she became nearly catatonic and was transferred to the ER and ultimately admitted. During her acute hospital stay she has been evaluated by Neurology and Psychiatry. EEG has revealed no evidence of seizure activity. Repeat brain imaging was unremarkable. It was ultimately determined that her AMS was due to due progression of her severe underlying dementia.   The patient has been deemed medically stable and is awaiting a disposition. The goal is to place her within the memory care unit but this is proving difficult as she is homeless and has no insurance. Per TOC patient's mother will go to DSS to apply for special assistance Medicaid given that this is a type of Medicaid that is required to be considered for placement at [place] [facility].   Significant events while hospitalized: 09/07/2021: admitted to behavioral health treated with Zyprexa Invega and Ativan challenge for catatonia. 12/24/2021: sent to ER from [facility] for evaluation for tachycardia 9/04-9/5, 9/5>9/9, LTM EEG  no seizure.  9/23 febrile secondary to Covid 19 + no pneumonia. 9/25 repeat CT brain unremarkable. 06/24/2022  psychiatry consulted for increasing agitation    Subjective: Seen and examined earlier this morning.  No major events overnight of this morning.  Remains confused and restless.  Objective: Vitals:   09/11/22 0553 09/11/22 1035 09/11/22 2017 09/12/22 0541  BP: 134/84 118/64 (!) 94/54 95/60   Pulse: 79 84 62   Resp: 16  18 17   Temp: 98.4 F (36.9 C)  97.7 F (36.5 C) 98 F (36.7 C)  TempSrc: Oral  Oral Oral  SpO2: 100%     Weight:      Height:        Examination:  GENERAL: No apparent distress.  Nontoxic. RESP:  No IWOB.  Fair aeration bilaterally. CVS:  RRR. Heart sounds normal.  ABD/GI/GU: BS+. Abd soft, NTND.  MSK/EXT:  Moves extremities. No apparent deformity. No edema.  SKIN: no apparent skin lesion or wound NEURO: Awake, alert and oriented.  Not talking.  Confused.  Does not follow commands. PSYCH: Confused and restless.   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  Evaluated by neurology and psych, and had extensive workup without explanation. Ultimately it is felt that her current mental status is a result of progressive severe early onset dementia and there is no expectation that it would improve beyond its current point but rather will in fact only decline further with time -Continue Haldol and as needed olanzapine, hydroxyzine, lorazepam, and mirtazapine  -Remains medically stable at this time and remains ready for disposition and difficult to place -Continue safety  sitter. -Continue dysphagia 3 diet.  Bipolar Disorder -Psych meds as above.  Vitamin B12 Deficiency: Resolved.   HTN: Normotensive.  Not on medications.   Tobacco Abuse -Nicotine Patch has been discontinued    Sinus Tachycardia: Resolved.  TTE reassuring.  TSH normal. -Continue metoprolol   CKD Stage IIIa:  Stable.  Constipation/altered GI function -Bowel regimen Body mass index is 19.39 kg/m. Nutrition Problem: Altered GI function Etiology: constipation Signs/Symptoms: other (comment) (last BM 10 days ago) Interventions: Other (Comment) (scheduled bowel regimen per MD)   DVT prophylaxis:  enoxaparin (LOVENOX) injection 40 mg Start: 12/26/21 1200  Code Status: Full code Family Communication: None at bedside Level of care: Med-Surg Status is: Inpatient Remains inpatient appropriate because: Lack of placement   Final disposition: Memory care? Consultants:  Neurology Psychiatry  25 minutes with more than 50% spent in reviewing records, counseling patient/family and coordinating care.   Sch Meds:  Scheduled Meds:  vitamin B-12  500 mcg Oral Daily   enoxaparin (LOVENOX) injection  40 mg Subcutaneous Q24H   haloperidol  2 mg Oral BID   hydrocerin   Topical BID   metoprolol tartrate  25 mg Oral BID   mirtazapine  15 mg Oral QHS   polyethylene glycol  17 g Oral Daily   senna-docusate  2 tablet Oral QHS   Continuous Infusions:  sodium chloride     PRN Meds:.acetaminophen, alum & mag hydroxide-simeth, bisacodyl, fluticasone, hydrOXYzine, LORazepam, magnesium hydroxide, OLANZapine **OR** OLANZapine, ondansetron, mouth rinse  Antimicrobials: Anti-infectives (From admission, onward)    None        I have personally reviewed the following labs and images: CBC: No results for input(s): "WBC", "NEUTROABS", "HGB", "HCT", "MCV", "PLT" in the last 168 hours. BMP &GFR No results for input(s): "NA", "K", "CL", "CO2", "GLUCOSE", "BUN", "CREATININE", "CALCIUM", "MG", "PHOS" in the last 168 hours.  Invalid input(s): "GFRCG" Estimated Creatinine Clearance: 50.6 mL/min (A) (by C-G formula based on SCr of 1.08 mg/dL (H)). Liver & Pancreas: No results for input(s): "AST", "ALT", "ALKPHOS", "BILITOT", "PROT", "ALBUMIN" in the last 168 hours. No results for input(s): "LIPASE",  "AMYLASE" in the last 168 hours. No results for input(s): "AMMONIA" in the last 168 hours. Diabetic: No results for input(s): "HGBA1C" in the last 72 hours. No results for input(s): "GLUCAP" in the last 168 hours. Cardiac Enzymes: No results for input(s): "CKTOTAL", "CKMB", "CKMBINDEX", "TROPONINI" in the last 168 hours. No results for input(s): "PROBNP" in the last 8760 hours. Coagulation Profile: No results for input(s): "INR", "PROTIME" in the last 168 hours. Thyroid Function Tests: No results for input(s): "TSH", "T4TOTAL", "FREET4", "T3FREE", "THYROIDAB" in the last 72 hours. Lipid Profile: No results for input(s): "CHOL", "HDL", "LDLCALC", "TRIG", "CHOLHDL", "LDLDIRECT" in the last 72 hours. Anemia Panel: No results for input(s): "VITAMINB12", "FOLATE", "FERRITIN", "TIBC", "IRON", "RETICCTPCT" in the last 72 hours. Urine analysis:    Component Value Date/Time   COLORURINE YELLOW 06/28/2022 0630   APPEARANCEUR CLEAR 06/28/2022 0630   LABSPEC 1.021 06/28/2022 0630   PHURINE 5.0 06/28/2022 0630   GLUCOSEU NEGATIVE 06/28/2022 0630   HGBUR NEGATIVE 06/28/2022 0630   BILIRUBINUR NEGATIVE 06/28/2022 0630   BILIRUBINUR neg 08/07/2016 1526   KETONESUR NEGATIVE 06/28/2022 0630   PROTEINUR NEGATIVE 06/28/2022 0630   UROBILINOGEN 2.0 (A) 08/07/2016 1526   UROBILINOGEN 1 12/30/2013 1216   NITRITE NEGATIVE 06/28/2022 0630   LEUKOCYTESUR NEGATIVE 06/28/2022 0630   Sepsis Labs: Invalid input(s): "PROCALCITONIN", "LACTICIDVEN"  Microbiology: No results found for this or any previous  visit (from the past 240 hour(s)).  Radiology Studies: No results found.    Florabel Faulks T. Swayze Kozuch [facility]  If 7PM-7AM, please contact night-coverage [email or web address] 09/12/2022, 11:49 AM

## 2022-09-13 NOTE — Progress Notes (Signed)
PROGRESS NOTE  Natalie Brown ZOX:[id number] DOB: 01/02/1966   PCP: Lavinia Sharps, NP  Patient is from: Homeless  DOA: 12/24/2021 LOS: 261  Chief complaints No chief complaint on file.    Brief Narrative / Interim history: 57 year old with a history of bipolar disorder and chronic dementia with advanced cerebral atrophy who was admitted to the acute units 12/24/2021 following a preceding admission at [facility] 09/06/2021-12/24/2021. She was initially admitted to [facility] with acute mania and aggressive behaviors. On 12/24/2021 she became nearly catatonic and was transferred to the ER and ultimately admitted. During her acute hospital stay she has been evaluated by Neurology and Psychiatry. EEG has revealed no evidence of seizure activity. Repeat brain imaging was unremarkable. It was ultimately determined that her AMS was due to due progression of her severe underlying dementia.   The patient has been deemed medically stable and is awaiting a disposition. The goal is to place her within the memory care unit but this is proving difficult as she is homeless and has no insurance. Per TOC patient's mother will go to DSS to apply for special assistance Medicaid given that this is a type of Medicaid that is required to be considered for placement at [place] [facility].   Significant events while hospitalized: 09/07/2021: admitted to behavioral health treated with Zyprexa Invega and Ativan challenge for catatonia. 12/24/2021: sent to ER from [facility] for evaluation for tachycardia 9/04-9/5, 9/5>9/9, LTM EEG  no seizure.  9/23 febrile secondary to Covid 19 + no pneumonia. 9/25 repeat CT brain unremarkable. 06/24/2022  psychiatry consulted for increasing agitation    Subjective: Seen and examined earlier this morning.  No major events overnight of this morning.  Remains confused and restless.  Objective: Vitals:   09/11/22 2017 09/12/22 0541 09/12/22 1926 09/13/22 1027  BP: (!) 94/54 95/60 99/89  116/74   Pulse: 62  74 94  Resp: 18 17 16 18   Temp: 97.7 F (36.5 C) 98 F (36.7 C) 98.1 F (36.7 C) 98.5 F (36.9 C)  TempSrc: Oral Oral Oral Oral  SpO2:   99% 100%  Weight:      Height:        Examination:  GENERAL: No apparent distress.  Nontoxic. RESP:  No IWOB.  Fair aeration bilaterally. CVS:  RRR. Heart sounds normal.  ABD/GI/GU: BS+. Abd soft, NTND.  MSK/EXT:  Moves extremities. No apparent deformity. No edema.  SKIN: no apparent skin lesion or wound NEURO: Awake, alert and oriented.  Not talking.  Confused.  Does not follow commands. PSYCH: Confused and restless.   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  Evaluated by neurology and psych, and had extensive workup without explanation. Ultimately it is felt that her current mental status is a result of progressive severe early onset dementia and there is no expectation that it would improve beyond its current point but rather will in fact only decline further with time -Continue Haldol and as needed olanzapine, hydroxyzine, lorazepam, and mirtazapine  -Remains medically stable at this time and remains ready for disposition and difficult to  place -Patent attorney. -Continue dysphagia 3 diet.  Bipolar Disorder -Psych meds as above.  Vitamin B12 Deficiency: Resolved.   HTN: Normotensive.  Not on medications.   Tobacco Abuse -Nicotine Patch has been discontinued    Sinus Tachycardia: Resolved.  TTE reassuring.  TSH normal. -Continue  metoprolol   CKD Stage IIIa: Stable.  Constipation/altered GI function -Bowel regimen Body mass index is 19.39 kg/m. Nutrition Problem: Altered GI function Etiology: constipation Signs/Symptoms: other (comment) (last BM 10 days ago) Interventions: Other (Comment) (scheduled bowel regimen per MD)   DVT prophylaxis:  enoxaparin (LOVENOX) injection 40 mg Start: 12/26/21 1200  Code Status: Full code Family Communication: None at bedside Level of care: Med-Surg Status is: Inpatient Remains inpatient appropriate because: Lack of placement   Final disposition: Memory care? Consultants:  Neurology Psychiatry  25 minutes with more than 50% spent in reviewing records, counseling patient/family and coordinating care.   Sch Meds:  Scheduled Meds:  vitamin B-12  500 mcg Oral Daily   enoxaparin (LOVENOX) injection  40 mg Subcutaneous Q24H   haloperidol  2 mg Oral BID   hydrocerin   Topical BID   metoprolol tartrate  25 mg Oral BID   mirtazapine  15 mg Oral QHS   polyethylene glycol  17 g Oral Daily   senna-docusate  2 tablet Oral QHS   Continuous Infusions:  sodium chloride     PRN Meds:.acetaminophen, alum & mag hydroxide-simeth, bisacodyl, fluticasone, hydrOXYzine, LORazepam, magnesium hydroxide, OLANZapine **OR** OLANZapine, ondansetron, mouth rinse  Antimicrobials: Anti-infectives (From admission, onward)    None        I have personally reviewed the following labs and images: CBC: No results for input(s): "WBC", "NEUTROABS", "HGB", "HCT", "MCV", "PLT" in the last 168 hours. BMP &GFR No results for input(s): "NA", "K", "CL", "CO2", "GLUCOSE", "BUN", "CREATININE", "CALCIUM", "MG", "PHOS" in the last 168 hours.  Invalid input(s): "GFRCG" Estimated Creatinine Clearance: 50.6 mL/min (A) (by C-G formula based on SCr of 1.08 mg/dL (H)). Liver & Pancreas: No results for input(s): "AST", "ALT", "ALKPHOS", "BILITOT", "PROT", "ALBUMIN" in the last 168 hours. No results  for input(s): "LIPASE", "AMYLASE" in the last 168 hours. No results for input(s): "AMMONIA" in the last 168 hours. Diabetic: No results for input(s): "HGBA1C" in the last 72 hours. No results for input(s): "GLUCAP" in the last 168 hours. Cardiac Enzymes: No results for input(s): "CKTOTAL", "CKMB", "CKMBINDEX", "TROPONINI" in the last 168 hours. No results for input(s): "PROBNP" in the last 8760 hours. Coagulation Profile: No results for input(s): "INR", "PROTIME" in the last 168 hours. Thyroid Function Tests: No results for input(s): "TSH", "T4TOTAL", "FREET4", "T3FREE", "THYROIDAB" in the last 72 hours. Lipid Profile: No results for input(s): "CHOL", "HDL", "LDLCALC", "TRIG", "CHOLHDL", "LDLDIRECT" in the last 72 hours. Anemia Panel: No results for input(s): "VITAMINB12", "FOLATE", "FERRITIN", "TIBC", "IRON", "RETICCTPCT" in the last 72 hours. Urine analysis:    Component Value Date/Time   COLORURINE YELLOW 06/28/2022 0630   APPEARANCEUR CLEAR 06/28/2022 0630   LABSPEC 1.021 06/28/2022 0630   PHURINE 5.0 06/28/2022 0630   GLUCOSEU NEGATIVE 06/28/2022 0630   HGBUR NEGATIVE 06/28/2022 0630   BILIRUBINUR NEGATIVE 06/28/2022 0630   BILIRUBINUR neg 08/07/2016 1526   KETONESUR NEGATIVE 06/28/2022 0630   PROTEINUR NEGATIVE 06/28/2022 0630   UROBILINOGEN 2.0 (A) 08/07/2016 1526   UROBILINOGEN 1 12/30/2013 1216   NITRITE NEGATIVE 06/28/2022 0630   LEUKOCYTESUR NEGATIVE 06/28/2022 0630   Sepsis Labs: Invalid input(s): "PROCALCITONIN", "LACTICIDVEN"  Microbiology: No results found for this  or any previous visit (from the past 240 hour(s)).  Radiology Studies: No results found.    Caycee Wanat T. Shelene Krage [facility]  If 7PM-7AM, please contact night-coverage [email or web address] 09/13/2022, 11:30 AM

## 2022-09-13 NOTE — Progress Notes (Signed)
Pt has been lethargic, arousable to stimuli since start of shift. Per dayshift report, pt required PRN ativan for agitation. Dinner tray untouched at bedside. Pt unable to stay awake for nighttime medications, MD made aware. VSS, pt sleeping in bed with bed alarm on and safety sitter at bedside for elopement precautions. Will continue with POC.

## 2022-09-14 NOTE — Progress Notes (Signed)
PROGRESS NOTE  Natalie Brown QMV:[id number] DOB: October 12, 1965   PCP: Lavinia Sharps, NP  Patient is from: Homeless  DOA: 12/24/2021 LOS: 262  Chief complaints No chief complaint on file.    Brief Narrative / Interim history: 57 year old with a history of bipolar disorder and chronic dementia with advanced cerebral atrophy who was admitted to the acute units 12/24/2021 following a preceding admission at [facility] 09/06/2021-12/24/2021. She was initially admitted to [facility] with acute mania and aggressive behaviors. On 12/24/2021 she became nearly catatonic and was transferred to the ER and ultimately admitted. During her acute hospital stay she has been evaluated by Neurology and Psychiatry. EEG has revealed no evidence of seizure activity. Repeat brain imaging was unremarkable. It was ultimately determined that her AMS was due to due progression of her severe underlying dementia.   The patient has been deemed medically stable and is awaiting a disposition. The goal is to place her within the memory care unit but this is proving difficult as she is homeless and has no insurance. Per TOC patient's mother will go to DSS to apply for special assistance Medicaid given that this is a type of Medicaid that is required to be considered for placement at [place] [facility].   Significant events while hospitalized: 09/07/2021: admitted to behavioral health treated with Zyprexa Invega and Ativan challenge for catatonia. 12/24/2021: sent to ER from [facility] for evaluation for tachycardia 9/04-9/5, 9/5>9/9, LTM EEG  no seizure.  9/23 febrile secondary to Covid 19 + no pneumonia. 9/25 repeat CT brain unremarkable. 06/24/2022  psychiatry consulted for increasing agitation    Subjective: Seen and examined earlier this morning.  No major events overnight of this morning.  Remains confused and restless.  Objective: Vitals:   09/12/22 1926 09/13/22 1027 09/13/22 2000 09/14/22 0952  BP: 99/89 116/74 94/60 (!) 96/59   Pulse: 74 94 87 88  Resp: 16 18 18 20   Temp: 98.1 F (36.7 C) 98.5 F (36.9 C) 98.7 F (37.1 C) 99.4 F (37.4 C)  TempSrc: Oral Oral Oral   SpO2: 99% 100% 100% 99%  Weight:      Height:        Examination:  GENERAL: No apparent distress.  Nontoxic. RESP:  No IWOB.  Fair aeration bilaterally. CVS:  RRR. Heart sounds normal.  ABD/GI/GU: BS+. Abd soft, NTND.  MSK/EXT:  Moves extremities. No apparent deformity. No edema.  SKIN: no apparent skin lesion or wound NEURO: Awake, alert and oriented.  Not talking.  Confused.  Does not follow commands. PSYCH: Confused and restless.   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  Evaluated by neurology and psych, and had extensive workup without explanation. Ultimately it is felt that her current mental status is a result of progressive severe early onset dementia and there is no expectation that it would improve beyond its current point but rather will in fact only decline further with time -Continue Haldol and as needed olanzapine, hydroxyzine, lorazepam, and mirtazapine  -Remains medically stable at this time and remains ready for disposition and difficult to  place -Patent attorney. -Continue dysphagia 3 diet.  Bipolar Disorder -Psych meds as above.  Vitamin B12 Deficiency: Resolved.   HTN: Normotensive.  Not on medications.   Tobacco Abuse -Nicotine Patch has been discontinued    Sinus Tachycardia: Resolved.  TTE reassuring.  TSH normal. -Continue  metoprolol   CKD Stage IIIa: Stable.  Constipation/altered GI function -Bowel regimen Body mass index is 19.39 kg/m. Nutrition Problem: Altered GI function Etiology: constipation Signs/Symptoms: other (comment) (last BM 10 days ago) Interventions: Other (Comment) (scheduled bowel regimen per MD)   DVT prophylaxis:  enoxaparin (LOVENOX) injection 40 mg Start: 12/26/21 1200  Code Status: Full code Family Communication: None at bedside Level of care: Med-Surg Status is: Inpatient Remains inpatient appropriate because: Lack of placement   Final disposition: Memory care? Consultants:  Neurology Psychiatry  25 minutes with more than 50% spent in reviewing records, counseling patient/family and coordinating care.   Sch Meds:  Scheduled Meds:  vitamin B-12  500 mcg Oral Daily   enoxaparin (LOVENOX) injection  40 mg Subcutaneous Q24H   haloperidol  2 mg Oral BID   hydrocerin   Topical BID   metoprolol tartrate  25 mg Oral BID   mirtazapine  15 mg Oral QHS   polyethylene glycol  17 g Oral Daily   senna-docusate  2 tablet Oral QHS   Continuous Infusions:  sodium chloride     PRN Meds:.acetaminophen, alum & mag hydroxide-simeth, bisacodyl, fluticasone, hydrOXYzine, LORazepam, magnesium hydroxide, OLANZapine **OR** OLANZapine, ondansetron, mouth rinse  Antimicrobials: Anti-infectives (From admission, onward)    None        I have personally reviewed the following labs and images: CBC: No results for input(s): "WBC", "NEUTROABS", "HGB", "HCT", "MCV", "PLT" in the last 168 hours. BMP &GFR No results for input(s): "NA", "K", "CL", "CO2", "GLUCOSE", "BUN", "CREATININE", "CALCIUM", "MG", "PHOS" in the last 168 hours.  Invalid input(s): "GFRCG" Estimated Creatinine Clearance: 50.6 mL/min (A) (by C-G formula based on SCr of 1.08 mg/dL (H)). Liver & Pancreas: No results for input(s): "AST", "ALT", "ALKPHOS", "BILITOT", "PROT", "ALBUMIN" in the last 168 hours. No results  for input(s): "LIPASE", "AMYLASE" in the last 168 hours. No results for input(s): "AMMONIA" in the last 168 hours. Diabetic: No results for input(s): "HGBA1C" in the last 72 hours. No results for input(s): "GLUCAP" in the last 168 hours. Cardiac Enzymes: No results for input(s): "CKTOTAL", "CKMB", "CKMBINDEX", "TROPONINI" in the last 168 hours. No results for input(s): "PROBNP" in the last 8760 hours. Coagulation Profile: No results for input(s): "INR", "PROTIME" in the last 168 hours. Thyroid Function Tests: No results for input(s): "TSH", "T4TOTAL", "FREET4", "T3FREE", "THYROIDAB" in the last 72 hours. Lipid Profile: No results for input(s): "CHOL", "HDL", "LDLCALC", "TRIG", "CHOLHDL", "LDLDIRECT" in the last 72 hours. Anemia Panel: No results for input(s): "VITAMINB12", "FOLATE", "FERRITIN", "TIBC", "IRON", "RETICCTPCT" in the last 72 hours. Urine analysis:    Component Value Date/Time   COLORURINE YELLOW 06/28/2022 0630   APPEARANCEUR CLEAR 06/28/2022 0630   LABSPEC 1.021 06/28/2022 0630   PHURINE 5.0 06/28/2022 0630   GLUCOSEU NEGATIVE 06/28/2022 0630   HGBUR NEGATIVE 06/28/2022 0630   BILIRUBINUR NEGATIVE 06/28/2022 0630   BILIRUBINUR neg 08/07/2016 1526   KETONESUR NEGATIVE 06/28/2022 0630   PROTEINUR NEGATIVE 06/28/2022 0630   UROBILINOGEN 2.0 (A) 08/07/2016 1526   UROBILINOGEN 1 12/30/2013 1216   NITRITE NEGATIVE 06/28/2022 0630   LEUKOCYTESUR NEGATIVE 06/28/2022 0630   Sepsis Labs: Invalid input(s): "PROCALCITONIN", "LACTICIDVEN"  Microbiology: No results found for this  or any previous visit (from the past 240 hour(s)).  Radiology Studies: No results found.    Jazzmin Newbold T. Hyun Reali [facility]  If 7PM-7AM, please contact night-coverage [email or web address] 09/14/2022, 12:11 PM

## 2022-09-14 NOTE — Plan of Care (Signed)

## 2022-09-15 NOTE — Progress Notes (Signed)
Was informed this am, by PCT/Sitter, that pt experienced a change in planes that was witnessed by PCT/Sitter. PCT/Sitter verbalized pt's mobility unsteady as pt backed up against wall before changing planes. Pt assessed by nurse, noted pts ROM to extremitys WNL to pts baseline, Using FLACC noted no S/S of pain or distress, attempted to communicate with patient in regards to experience pt unable to give clear response, noted no physical injury. V/S WNL to pts Baseline, MD notified, pt remains in room with PCT/sitter present, bed low and locked.

## 2022-09-15 NOTE — Plan of Care (Signed)

## 2022-09-15 NOTE — Progress Notes (Addendum)
PROGRESS NOTE  Natalie Brown ZOX:[id number] DOB: [birth year]   PCP: Lavinia Sharps, NP  Patient is from: Homeless  DOA: 12/24/2021 LOS: 263  Chief complaints No chief complaint on file.    Brief Narrative / Interim history: 57 year old with a history of bipolar disorder and chronic dementia with advanced cerebral atrophy who was admitted to the acute units 12/24/2021 following a preceding admission at [facility] 09/06/2021-12/24/2021. She was initially admitted to [facility] with acute mania and aggressive behaviors. On 12/24/2021 she became nearly catatonic and was transferred to the ER and ultimately admitted. During her acute hospital stay she has been evaluated by Neurology and Psychiatry. EEG has revealed no evidence of seizure activity. Repeat brain imaging was unremarkable. It was ultimately determined that her AMS was due to due progression of her severe underlying dementia.   The patient has been deemed medically stable and is awaiting a disposition. The goal is to place her within the memory care unit but this is proving difficult as she is homeless and has no insurance. Per TOC patient's mother will go to DSS to apply for special assistance Medicaid given that this is a type of Medicaid that is required to be considered for placement at [place] [facility].   Significant events while hospitalized: 09/07/2021: admitted to behavioral health treated with Zyprexa Invega and Ativan challenge for catatonia. 12/24/2021: sent to ER from [facility] for evaluation for tachycardia 9/04-9/5, 9/5>9/9, LTM EEG  no seizure.  9/23 febrile secondary to Covid 19 + no pneumonia. 9/25 repeat CT brain unremarkable. 06/24/2022  psychiatry consulted for increasing agitation    Subjective: Seen and examined earlier this morning.  Sleeping after breakfast.   Objective: Vitals:   09/14/22 1717 09/14/22 2215 09/15/22 0524 09/15/22 1016  BP: 119/85 97/64 (!) 140/101 109/79  Pulse:  60 88   Resp: 18 15 18    Temp: 98.7  F (37.1 C) (!) 97.5 F (36.4 C) (!) 97.5 F (36.4 C)   TempSrc: Oral  Oral   SpO2: 100% 100% 100%   Weight:      Height:        Examination:  GENERAL: No apparent distress.  Nontoxic. RESP:  No IWOB.  Fair aeration bilaterally. CVS:  RRR. Heart sounds normal.  ABD/GI/GU: BS+. Abd soft, NTND.  MSK/EXT:  Moves extremities. No apparent deformity. No edema.  SKIN: no apparent skin lesion or wound NEURO: Sleeping   Assessment and plan: Principal Problem:   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) Active Problems:   Delirium due to multiple etiologies, acute, hyperactive   Bipolar affective disorder, current episode manic with psychotic symptoms (HCC)   Sinus tachycardia   Tobacco abuse   Altered mental state   B12 deficiency   COVID-19 virus infection   Drooling   Essential hypertension  Acute Metabolic Encephalopathy on chronic progressive early onset Dementia: Nonverbal and noninteractive for most part.  Evaluated by neurology and psych, and had extensive workup without explanation. Ultimately it is felt that her current mental status is a result of progressive severe early onset dementia and there is no expectation that it would improve beyond its current point but rather will in fact only decline further with time -Continue Haldol and as needed olanzapine, hydroxyzine, lorazepam, and mirtazapine  -Remains medically stable at this time and remains ready for disposition and difficult to place -Patent attorney. -Continue dysphagia 3 diet.  Bipolar Disorder -Psych meds as above.  Vitamin B12 Deficiency: Resolved.   HTN: Normotensive.  Not  on medications.   Tobacco Abuse -Nicotine Patch has been discontinued    Sinus Tachycardia: Resolved.  TTE reassuring.  TSH normal. -Continue metoprolol   CKD Stage IIIa: Stable.  Constipation/altered GI function -Bowel regimen Body mass index is 19.39 kg/m. Nutrition Problem: Altered GI  function Etiology: constipation Signs/Symptoms: other (comment) (last BM 10 days ago) Interventions: Other (Comment) (scheduled bowel regimen per MD)   DVT prophylaxis:  enoxaparin (LOVENOX) injection 40 mg Start: 12/26/21 1200  Code Status: Full code Family Communication: None at bedside Level of care: Med-Surg Status is: Inpatient Remains inpatient appropriate because: Lack of placement   Final disposition: Memory care? Consultants:  Neurology Psychiatry  25 minutes with more than 50% spent in reviewing records, counseling patient/family and coordinating care.   Sch Meds:  Scheduled Meds:  vitamin B-12  500 mcg Oral Daily   enoxaparin (LOVENOX) injection  40 mg Subcutaneous Q24H   haloperidol  2 mg Oral BID   hydrocerin   Topical BID   metoprolol tartrate  25 mg Oral BID   mirtazapine  15 mg Oral QHS   polyethylene glycol  17 g Oral Daily   senna-docusate  2 tablet Oral QHS   Continuous Infusions:  sodium chloride     PRN Meds:.acetaminophen, alum & mag hydroxide-simeth, bisacodyl, fluticasone, hydrOXYzine, LORazepam, magnesium hydroxide, OLANZapine **OR** OLANZapine, ondansetron, mouth rinse  Antimicrobials: Anti-infectives (From admission, onward)    None        I have personally reviewed the following labs and images: CBC: No results for input(s): "WBC", "NEUTROABS", "HGB", "HCT", "MCV", "PLT" in the last 168 hours. BMP &GFR No results for input(s): "NA", "K", "CL", "CO2", "GLUCOSE", "BUN", "CREATININE", "CALCIUM", "MG", "PHOS" in the last 168 hours.  Invalid input(s): "GFRCG" Estimated Creatinine Clearance: 50.6 mL/min (A) (by C-G formula based on SCr of 1.08 mg/dL (H)). Liver & Pancreas: No results for input(s): "AST", "ALT", "ALKPHOS", "BILITOT", "PROT", "ALBUMIN" in the last 168 hours. No results for input(s): "LIPASE", "AMYLASE" in the last 168 hours. No results for input(s): "AMMONIA" in the last 168 hours. Diabetic: No results for input(s):  "HGBA1C" in the last 72 hours. No results for input(s): "GLUCAP" in the last 168 hours. Cardiac Enzymes: No results for input(s): "CKTOTAL", "CKMB", "CKMBINDEX", "TROPONINI" in the last 168 hours. No results for input(s): "PROBNP" in the last 8760 hours. Coagulation Profile: No results for input(s): "INR", "PROTIME" in the last 168 hours. Thyroid Function Tests: No results for input(s): "TSH", "T4TOTAL", "FREET4", "T3FREE", "THYROIDAB" in the last 72 hours. Lipid Profile: No results for input(s): "CHOL", "HDL", "LDLCALC", "TRIG", "CHOLHDL", "LDLDIRECT" in the last 72 hours. Anemia Panel: No results for input(s): "VITAMINB12", "FOLATE", "FERRITIN", "TIBC", "IRON", "RETICCTPCT" in the last 72 hours. Urine analysis:    Component Value Date/Time   COLORURINE YELLOW 06/28/2022 0630   APPEARANCEUR CLEAR 06/28/2022 0630   LABSPEC 1.021 06/28/2022 0630   PHURINE 5.0 06/28/2022 0630   GLUCOSEU NEGATIVE 06/28/2022 0630   HGBUR NEGATIVE 06/28/2022 0630   BILIRUBINUR NEGATIVE 06/28/2022 0630   BILIRUBINUR neg 08/07/2016 1526   KETONESUR NEGATIVE 06/28/2022 0630   PROTEINUR NEGATIVE 06/28/2022 0630   UROBILINOGEN 2.0 (A) 08/07/2016 1526   UROBILINOGEN 1 12/30/2013 1216   NITRITE NEGATIVE 06/28/2022 0630   LEUKOCYTESUR NEGATIVE 06/28/2022 0630   Sepsis Labs: Invalid input(s): "PROCALCITONIN", "LACTICIDVEN"  Microbiology: No results found for this or any previous visit (from the past 240 hour(s)).  Radiology Studies: No results found.    Casyn Becvar T. Suliman Termini [facility]  If 7PM-7AM,  please contact night-coverage [email or web address] 09/15/2022, 11:06 AM

## 2022-09-16 NOTE — Progress Notes (Signed)
PROGRESS NOTE  WHITNEI BODE ZOX:[id number] DOB: 04-25-1965   PCP: Lavinia Sharps, NP  Patient is from: Homeless  DOA: 12/24/2021 LOS: 264  Chief complaints No chief complaint on file.    Brief Narrative / Interim history: 57 year old with a history of bipolar disorder and chronic dementia with advanced cerebral atrophy who was admitted to the acute units 12/24/2021 following a preceding admission at [facility] 09/06/2021-12/24/2021. She was initially admitted to [facility] with acute mania and aggressive behaviors. On 12/24/2021 she became nearly catatonic and was transferred to the ER and ultimately admitted. During her acute hospital stay she has been evaluated by Neurology and Psychiatry. EEG has revealed no evidence of seizure activity. Repeat brain imaging was unremarkable. It was ultimately determined that her AMS was due to due progression of her severe underlying dementia.   The patient has been deemed medically stable and is awaiting a disposition. The goal is to place her within the memory care unit but this is proving difficult as she is homeless and has no insurance. Per TOC patient's mother will go to DSS to apply for special assistance Medicaid given that this is a type of Medicaid that is required to be considered for placement at [facility].   Significant events while hospitalized: 09/07/2021: admitted to behavioral health treated with Zyprexa Invega and Ativan challenge for catatonia. 12/24/2021: sent to ER from [facility] for evaluation for tachycardia 9/04-9/5, 9/5>9/9, LTM EEG  no seizure.  9/23 febrile secondary to Covid 19 + no pneumonia. 9/25 repeat CT brain unremarkable. 06/24/2022  psychiatry consulted for increasing agitation    Subjective: Seen and examined earlier this morning.  Awake and alert but not interacting.  Objective: Vitals:   09/15/22 0524 09/15/22 1016 09/15/22 2004 09/16/22 0513  BP: (!) 140/101 109/79 (!) 99/58 103/67  Pulse: 88  (!) 55 73  Resp: 18   17 18   Temp: (!) 97.5 F (36.4 C)  (!) 97.5 F (36.4 C) 98.8 F (37.1 C)  TempSrc: Oral     SpO2: 100%  98% 100%  Weight:      Height:        Examination:  GENERAL: No apparent distress.  Nontoxic. RESP:  No IWOB.  Fair aeration bilaterally. CVS:  RRR. Heart sounds normal.  ABD/GI/GU: BS+. Abd soft, NTND.  MSK/EXT:  Moves extremities. No apparent deformity. No edema.  SKIN: no apparent skin lesion or wound NEURO: Awake and alert but not interacting.   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  Evaluated by neurology and psych, and had extensive workup without explanation. Ultimately it is felt that her current mental status is a result of progressive severe early onset dementia and there is no expectation that it would improve beyond its current point but rather will in fact only decline further with time -Continue Haldol and as needed olanzapine, hydroxyzine, lorazepam, and mirtazapine  -Remains medically stable at this time and remains ready for disposition and difficult to place -Patent attorney. -Continue dysphagia 3 diet.  Bipolar Disorder -Psych meds as above.  Vitamin  B12 Deficiency: Resolved.   HTN: Normotensive.  Not on medications.   Tobacco Abuse -Nicotine Patch has been discontinued    Sinus Tachycardia: Resolved.  TTE reassuring.  TSH normal. -Continue metoprolol   CKD Stage IIIa: Stable.  Constipation/altered GI function -Bowel regimen Body mass index is 19.39  kg/m. Nutrition Problem: Altered GI function Etiology: constipation Signs/Symptoms: other (comment) (last BM 10 days ago) Interventions: Other (Comment) (scheduled bowel regimen per MD)   DVT prophylaxis:  enoxaparin (LOVENOX) injection 40 mg Start: 12/26/21 1200  Code Status: Full code Family Communication: None at bedside Level of care: Med-Surg Status is: Inpatient Remains inpatient appropriate because: Lack of placement   Final disposition: Memory care? Consultants:  Neurology Psychiatry  25 minutes with more than 50% spent in reviewing records, counseling patient/family and coordinating care.   Sch Meds:  Scheduled Meds:  vitamin B-12  500 mcg Oral Daily   enoxaparin (LOVENOX) injection  40 mg Subcutaneous Q24H   haloperidol  2 mg Oral BID   hydrocerin   Topical BID   metoprolol tartrate  25 mg Oral BID   mirtazapine  15 mg Oral QHS   polyethylene glycol  17 g Oral Daily   senna-docusate  2 tablet Oral QHS   Continuous Infusions:  sodium chloride     PRN Meds:.acetaminophen, alum & mag hydroxide-simeth, bisacodyl, fluticasone, hydrOXYzine, LORazepam, magnesium hydroxide, OLANZapine **OR** OLANZapine, ondansetron, mouth rinse  Antimicrobials: Anti-infectives (From admission, onward)    None        I have personally reviewed the following labs and images: CBC: No results for input(s): "WBC", "NEUTROABS", "HGB", "HCT", "MCV", "PLT" in the last 168 hours. BMP &GFR No results for input(s): "NA", "K", "CL", "CO2", "GLUCOSE", "BUN", "CREATININE", "CALCIUM", "MG", "PHOS" in the last 168 hours.  Invalid input(s): "GFRCG" Estimated Creatinine Clearance: 50.6 mL/min (A) (by C-G formula based on SCr of 1.08 mg/dL (H)). Liver & Pancreas: No results for input(s): "AST", "ALT", "ALKPHOS", "BILITOT", "PROT", "ALBUMIN" in the last 168 hours. No results for input(s): "LIPASE", "AMYLASE" in the last 168 hours. No results for input(s): "AMMONIA" in the last 168  hours. Diabetic: No results for input(s): "HGBA1C" in the last 72 hours. No results for input(s): "GLUCAP" in the last 168 hours. Cardiac Enzymes: No results for input(s): "CKTOTAL", "CKMB", "CKMBINDEX", "TROPONINI" in the last 168 hours. No results for input(s): "PROBNP" in the last 8760 hours. Coagulation Profile: No results for input(s): "INR", "PROTIME" in the last 168 hours. Thyroid Function Tests: No results for input(s): "TSH", "T4TOTAL", "FREET4", "T3FREE", "THYROIDAB" in the last 72 hours. Lipid Profile: No results for input(s): "CHOL", "HDL", "LDLCALC", "TRIG", "CHOLHDL", "LDLDIRECT" in the last 72 hours. Anemia Panel: No results for input(s): "VITAMINB12", "FOLATE", "FERRITIN", "TIBC", "IRON", "RETICCTPCT" in the last 72 hours. Urine analysis:    Component Value Date/Time   COLORURINE YELLOW 06/28/2022 0630   APPEARANCEUR CLEAR 06/28/2022 0630   LABSPEC 1.021 06/28/2022 0630   PHURINE 5.0 06/28/2022 0630   GLUCOSEU NEGATIVE 06/28/2022 0630   HGBUR NEGATIVE 06/28/2022 0630   BILIRUBINUR NEGATIVE 06/28/2022 0630   BILIRUBINUR neg 08/07/2016 1526   KETONESUR NEGATIVE 06/28/2022 0630   PROTEINUR NEGATIVE 06/28/2022 0630   UROBILINOGEN 2.0 (A) 08/07/2016 1526   UROBILINOGEN 1 12/30/2013 1216   NITRITE NEGATIVE 06/28/2022 0630   LEUKOCYTESUR NEGATIVE 06/28/2022 0630   Sepsis Labs: Invalid input(s): "PROCALCITONIN", "LACTICIDVEN"  Microbiology: No results found for this or any previous visit (from the past 240 hour(s)).  Radiology Studies: No results found.  Tajah Schreiner T. Catrell Morrone [facility]  If 7PM-7AM, please contact night-coverage [email or web address] 09/16/2022, 1:51 PM

## 2022-09-17 NOTE — Progress Notes (Signed)
Initial Nutrition Assessment  DOCUMENTATION CODES:   Not applicable  INTERVENTION:  - Continue Dys 3, thin liquids.   NUTRITION DIAGNOSIS:   Altered GI function related to constipation as evidenced by other (comment) (last BM 10 days ago).  GOAL:   Other (Comment) (more frequent bowel movements)  MONITOR:   I & O's  REASON FOR ASSESSMENT:   LOS    ASSESSMENT:   57 y.o. female admits related to AMS. PMH includes: Bipolar disorde rand HTN. Pt is currently receiving medical management related to early onset dementia with behavioral disturbances.  Meds reviewed: Vit B12, remeron, miralax, senokot. Labs reviewed: WDL.   Pt continues with good PO intakes. Per record, pt has mostly been eating 50-100% of her meals. RD will continue to monitor PO intakes.   Diet Order:   Diet Order             DIET DYS 3 Room service appropriate? Yes; Fluid consistency: Thin  Diet effective now                   EDUCATION NEEDS:   Not appropriate for education at this time  Skin:  Skin Assessment: Reviewed RN Assessment  Last BM:  5/25 - type 7  Height:   Ht Readings from Last 1 Encounters:  02/03/22 5\' 6"  (1.676 m)    Weight:   Wt Readings from Last 1 Encounters:  09/17/22 54 kg    Ideal Body Weight:     BMI:  Body mass index is 19.21 kg/m.  Estimated Nutritional Needs:   Kcal:  1800-2160 kcals  Protein:  90-105 gm  Fluid:  >/= 1.8 L  Bethann Humble, RD, LDN, CNSC.

## 2022-09-17 NOTE — TOC Progression Note (Signed)
Transition of Care [facility]) - Progression Note    Patient Details  Name: EDYTHE DENZLER MRN: [id number] Date of Birth: 1965/12/01  Transition of Care [facility]) CM/SW Contact  Inis Sizer, LCSW Phone Number: 09/17/2022, 2:57 PM  Clinical Narrative:    CSW spoke with [place] at [facility] who states none of her facilities have a memory care bed open at this time.  CSW to continue following for discharge planning purposes.    Barriers to Discharge: Financial Resources, Inadequate or no insurance, Homeless with medical needs  Expected Discharge Plan and Services In-house Referral: Clinical Social Work   Post Acute Care Choice: Nursing Home Living arrangements for the past 2 months: Homeless [facility]) Expected Discharge Date: 12/24/21                                     Social Determinants of Health (SDOH) Interventions SDOH Screenings   Food Insecurity: No Food Insecurity (03/05/2022)  Housing: High Risk (03/21/2022)  Alcohol Screen: Low Risk  (09/06/2021)  Depression (PHQ2-9): Medium Risk (05/26/2020)  Tobacco Use: High Risk (03/21/2022)    Readmission Risk Interventions     No data to display

## 2022-09-17 NOTE — Progress Notes (Signed)
PROGRESS NOTE  Natalie Brown ZOX:[id number] DOB: June 11, 1965   PCP: Lavinia Sharps, NP  Patient is from: Homeless  DOA: 12/24/2021 LOS: 265  Chief complaints No chief complaint on file.    Brief Narrative / Interim history: 57 year old with a history of bipolar disorder and chronic dementia with advanced cerebral atrophy who was admitted to the acute units 12/24/2021 following a preceding admission at [facility] 09/06/2021-12/24/2021. She was initially admitted to [facility] with acute mania and aggressive behaviors. On 12/24/2021 she became nearly catatonic and was transferred to the ER and ultimately admitted. During her acute hospital stay she has been evaluated by Neurology and Psychiatry. EEG has revealed no evidence of seizure activity. Repeat brain imaging was unremarkable. It was ultimately determined that her AMS was due to due progression of her severe underlying dementia.   The patient has been deemed medically stable and is awaiting a disposition. The goal is to place her within the memory care unit but this is proving difficult as she is homeless and has no insurance. Per TOC patient's mother will go to DSS to apply for special assistance Medicaid.   Significant events while hospitalized: 09/07/2021: admitted to behavioral health treated with Zyprexa Invega and Ativan challenge for catatonia. 12/24/2021: sent to ER from [facility] for evaluation for tachycardia 9/04-9/5, 9/5>9/9, LTM EEG  no seizure.  9/23 febrile secondary to Covid 19 + no pneumonia. 9/25 repeat CT brain unremarkable. 06/24/2022  psychiatry consulted for increasing agitation    Subjective: Seen and examined earlier this morning.  Awake and alert but not interacting or talking  Objective: Vitals:   09/15/22 1016 09/15/22 2004 09/16/22 0513 09/16/22 1749  BP: 109/79 (!) 99/58 103/67 94/64  Pulse:  (!) 55 73 (!) 57  Resp:  17 18 18   Temp:  (!) 97.5 F (36.4 C) 98.8 F (37.1 C) 98.2 F (36.8 C)  TempSrc:    Oral   SpO2:  98% 100% 100%  Weight:      Height:        Examination:  GENERAL: No apparent distress.  Nontoxic. RESP:  No IWOB.  Fair aeration bilaterally. CVS:  RRR. Heart sounds normal.  ABD/GI/GU: BS+. Abd soft, NTND.  MSK/EXT:  Moves extremities. No apparent deformity. No edema.  SKIN: no apparent skin lesion or wound NEURO: Awake but not interacting.   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  Evaluated by neurology and psych, and had extensive workup without explanation. Ultimately it is felt that her current mental status is a result of progressive severe early onset dementia and there is no expectation that it would improve beyond its current point but rather will in fact only decline further with time -Continue Haldol and as needed olanzapine, hydroxyzine, lorazepam, and mirtazapine  -Remains medically stable at this time and remains ready for disposition and difficult to place -Patent attorney. -Continue dysphagia 3 diet.  Bipolar Disorder -Psych meds as above.  Vitamin B12 Deficiency: Resolved.   HTN: Normotensive.  Not on medications.   Tobacco Abuse -Nicotine Patch has been discontinued  Sinus Tachycardia: Resolved.  TTE reassuring.  TSH normal. -Continue metoprolol   CKD Stage IIIa: Stable.  Constipation/altered GI function -Bowel regimen Body mass index is 19.39 kg/m. Nutrition Problem: Altered GI function Etiology: constipation Signs/Symptoms: other (comment) (last BM 10 days  ago) Interventions: Other (Comment) (scheduled bowel regimen per MD)   DVT prophylaxis:  enoxaparin (LOVENOX) injection 40 mg Start: 12/26/21 1200  Code Status: Full code Family Communication: None at bedside Level of care: Med-Surg Status is: Inpatient Remains inpatient appropriate because: Lack of placement   Final disposition: Memory care? Consultants:  Neurology Psychiatry  25 minutes with more than 50% spent in reviewing records, counseling patient/family and coordinating care.   Sch Meds:  Scheduled Meds:  vitamin B-12  500 mcg Oral Daily   enoxaparin (LOVENOX) injection  40 mg Subcutaneous Q24H   haloperidol  2 mg Oral BID   hydrocerin   Topical BID   metoprolol tartrate  25 mg Oral BID   mirtazapine  15 mg Oral QHS   polyethylene glycol  17 g Oral Daily   senna-docusate  2 tablet Oral QHS   Continuous Infusions:  sodium chloride     PRN Meds:.acetaminophen, alum & mag hydroxide-simeth, bisacodyl, fluticasone, hydrOXYzine, LORazepam, magnesium hydroxide, OLANZapine **OR** OLANZapine, ondansetron, mouth rinse  Antimicrobials: Anti-infectives (From admission, onward)    None        I have personally reviewed the following labs and images: CBC: No results for input(s): "WBC", "NEUTROABS", "HGB", "HCT", "MCV", "PLT" in the last 168 hours. BMP &GFR No results for input(s): "NA", "K", "CL", "CO2", "GLUCOSE", "BUN", "CREATININE", "CALCIUM", "MG", "PHOS" in the last 168 hours.  Invalid input(s): "GFRCG" Estimated Creatinine Clearance: 50.6 mL/min (A) (by C-G formula based on SCr of 1.08 mg/dL (H)). Liver & Pancreas: No results for input(s): "AST", "ALT", "ALKPHOS", "BILITOT", "PROT", "ALBUMIN" in the last 168 hours. No results for input(s): "LIPASE", "AMYLASE" in the last 168 hours. No results for input(s): "AMMONIA" in the last 168 hours. Diabetic: No results for input(s): "HGBA1C" in the last 72 hours. No results for input(s): "GLUCAP" in the last 168  hours. Cardiac Enzymes: No results for input(s): "CKTOTAL", "CKMB", "CKMBINDEX", "TROPONINI" in the last 168 hours. No results for input(s): "PROBNP" in the last 8760 hours. Coagulation Profile: No results for input(s): "INR", "PROTIME" in the last 168 hours. Thyroid Function Tests: No results for input(s): "TSH", "T4TOTAL", "FREET4", "T3FREE", "THYROIDAB" in the last 72 hours. Lipid Profile: No results for input(s): "CHOL", "HDL", "LDLCALC", "TRIG", "CHOLHDL", "LDLDIRECT" in the last 72 hours. Anemia Panel: No results for input(s): "VITAMINB12", "FOLATE", "FERRITIN", "TIBC", "IRON", "RETICCTPCT" in the last 72 hours. Urine analysis:    Component Value Date/Time   COLORURINE YELLOW 06/28/2022 0630   APPEARANCEUR CLEAR 06/28/2022 0630   LABSPEC 1.021 06/28/2022 0630   PHURINE 5.0 06/28/2022 0630   GLUCOSEU NEGATIVE 06/28/2022 0630   HGBUR NEGATIVE 06/28/2022 0630   BILIRUBINUR NEGATIVE 06/28/2022 0630   BILIRUBINUR neg 08/07/2016 1526   KETONESUR NEGATIVE 06/28/2022 0630   PROTEINUR NEGATIVE 06/28/2022 0630   UROBILINOGEN 2.0 (A) 08/07/2016 1526   UROBILINOGEN 1 12/30/2013 1216   NITRITE NEGATIVE 06/28/2022 0630   LEUKOCYTESUR NEGATIVE 06/28/2022 0630   Sepsis Labs: Invalid input(s): "PROCALCITONIN", "LACTICIDVEN"  Microbiology: No results found for this or any previous visit (from the past 240 hour(s)).  Radiology Studies: No results found.    Akiel Fennell T. Emmersyn Kratzke [facility]  If 7PM-7AM, please contact night-coverage [email or web address] 09/17/2022, 8:37 AM

## 2022-09-18 NOTE — Progress Notes (Signed)
[facility]                                                                               Natalie Brown, is a 57 y.o. female, DOB - 1965-11-11, NFA:[id number] Admit date - 12/24/2021    Outpatient Primary MD for the patient is [name], [name], NP  LOS - 266  days    Brief summary   57 year old with a history of bipolar disorder and chronic dementia with advanced cerebral atrophy who was admitted to the acute units 12/24/2021 following a preceding admission at [facility] 09/06/2021-12/24/2021. She was initially admitted to [facility] with acute mania and aggressive behaviors. On 12/24/2021 she became nearly catatonic and was transferred to the ER and ultimately admitted. During her acute hospital stay she has been evaluated by Neurology and Psychiatry. EEG has revealed no evidence of seizure activity. Repeat brain imaging was unremarkable. It was ultimately determined that her AMS was due to due progression of her severe underlying dementia.    The patient has been deemed medically stable and is awaiting a disposition. The goal is to place her within the memory care unit but this is proving difficult as she is homeless and has no insurance. Per TOC patient's mother will go to DSS to apply for special assistance Medicaid.    Significant events while hospitalized: 09/07/2021: admitted to behavioral health treated with Zyprexa Invega and Ativan challenge for catatonia. 12/24/2021: sent to ER from [facility] for evaluation for tachycardia 9/04-9/5, 9/5>9/9, LTM EEG  no seizure.  9/23 febrile secondary to Covid 19 + no pneumonia. 9/25 repeat CT brain unremarkable. 06/24/2022  psychiatry consulted for increasing agitation  No new events overnight.     Assessment & Plan    Assessment and Plan:   ssential hypertension   Acute Metabolic Encephalopathy on chronic progressive early onset Dementia: Nonverbal and noninteractive for most part.  Evaluated by neurology and psych, and had extensive  workup without explanation. Ultimately it is felt that her current mental status is a result of progressive severe early onset dementia and there is no expectation that it would improve beyond its current point but rather will in fact only decline further with time -Continue Haldol and as needed olanzapine, hydroxyzine, lorazepam, and mirtazapine  -Remains medically stable at this time and remains ready for disposition and difficult to place -Patent attorney. -Continue dysphagia 3 diet. - no new events overnight. Sitting on the bed with eyes closed.    Bipolar Disorder -Psych meds as above.   Vitamin B12 Deficiency: Resolved.   HTN: well controlled.    Tobacco Abuse -Nicotine Patch has been discontinued    Sinus Tachycardia: Resolved.  TTE reassuring.  TSH normal. -Continue metoprolol   CKD Stage IIIa: Stable.   Constipation/altered GI function -Bowel regimen  Malnutrition Type:  Nutrition Problem: Altered GI function Etiology: constipation   Malnutrition Characteristics:  Signs/Symptoms: other (comment) (last BM 10 days ago)   Nutrition Interventions:  Interventions: Other (Comment) (scheduled bowel regimen per MD)  Estimated body mass index is 19.21 kg/m as calculated from the following:   Height as of this encounter: 5\' 6"  (1.676 m).   Weight as of this  encounter: 54 kg.  Code Status: full code DVT Prophylaxis:  enoxaparin (LOVENOX) injection 40 mg Start: 12/26/21 1200   Level of Care: Level of care: Med-Surg Family Communication: none at bedside.   Disposition Plan:     Remains inpatient appropriate:  safe disposition.   Procedures:  None.   Consultants:   Psychiatry.   Antimicrobials:   Anti-infectives (From admission, onward)    None        Medications  Scheduled Meds:  vitamin B-12  500 mcg Oral Daily   enoxaparin (LOVENOX) injection  40 mg Subcutaneous Q24H   haloperidol  2 mg Oral BID   hydrocerin   Topical BID   metoprolol  tartrate  25 mg Oral BID   mirtazapine  15 mg Oral QHS   polyethylene glycol  17 g Oral Daily   senna-docusate  2 tablet Oral QHS   Continuous Infusions:  sodium chloride     PRN Meds:.acetaminophen, alum & mag hydroxide-simeth, bisacodyl, fluticasone, hydrOXYzine, LORazepam, magnesium hydroxide, OLANZapine **OR** OLANZapine, ondansetron, mouth rinse    Subjective:   Natalie Brown was seen and examined today.  No new events overnight.   Objective:   Vitals:   09/17/22 1701 09/17/22 2024 09/18/22 0957 09/18/22 1059  BP: (!) 140/92 96/68 (!) 90/53 101/70  Pulse:  77  (!) 101  Resp: 20 18    Temp: 99.1 F (37.3 C) 97.7 F (36.5 C) 97.7 F (36.5 C)   TempSrc: Oral  Axillary   SpO2: 97% 100%    Weight:      Height:        Intake/Output Summary (Last 24 hours) at 09/18/2022 1423 Last data filed at 09/17/2022 1916 Gross per 24 hour  Intake 120 ml  Output --  Net 120 ml   Filed Weights   08/27/22 0500 09/03/22 0812 09/17/22 0812  Weight: 55.3 kg 54.5 kg 54 kg     Exam General exam: Appears calm and comfortable  Respiratory system: Clear to auscultation. Respiratory effort normal. Cardiovascular system: S1 & S2 heard, RRR. No JVD, murmurs,  Gastrointestinal system: Abdomen is nondistended, soft and nontender.  Central nervous system: non verbal .   Extremities: Symmetric 5 x 5 power. Skin: No rashes,  Psychiatry: unable to assess    Data Reviewed:  I have personally reviewed following labs and imaging studies   CBC Lab Results  Component Value Date   WBC 5.7 09/04/2022   RBC 4.82 09/04/2022   HGB 13.7 09/04/2022   HCT 40.9 09/04/2022   MCV 84.9 09/04/2022   MCH 28.4 09/04/2022   PLT 294 09/04/2022   MCHC 33.5 09/04/2022   RDW 13.2 09/04/2022   LYMPHSABS 1.4 09/04/2022   MONOABS 0.3 09/04/2022   EOSABS 0.1 09/04/2022   BASOSABS 0.0 09/04/2022     Last metabolic panel Lab Results  Component Value Date   NA 138 09/04/2022   K 4.1 09/04/2022   CL  107 09/04/2022   CO2 22 09/04/2022   BUN 15 09/04/2022   CREATININE 1.08 (H) 09/04/2022   GLUCOSE 98 09/04/2022   GFRNONAA >60 09/04/2022   GFRAA 65 09/19/2017   CALCIUM 8.9 09/04/2022   PHOS 3.5 09/04/2022   PROT 6.8 09/04/2022   ALBUMIN 3.6 09/04/2022   LABGLOB 2.8 06/21/2020   AGRATIO 1.5 06/21/2020   BILITOT 0.4 09/04/2022   ALKPHOS 56 09/04/2022   AST 37 09/04/2022   ALT 23 09/04/2022   ANIONGAP 9 09/04/2022    CBG (last 3)  No results  for input(s): "GLUCAP" in the last 72 hours.    Coagulation Profile: No results for input(s): "INR", "PROTIME" in the last 168 hours.   Radiology Studies: No results found.     Kathlen Mody M.D. [facility] 09/18/2022, 2:23 PM  Available via Epic secure chat 7am-7pm After 7 pm, please refer to night coverage provider listed on amion.

## 2022-09-18 NOTE — Plan of Care (Signed)

## 2022-09-19 ENCOUNTER — Inpatient Hospital Stay (HOSPITAL_COMMUNITY): Payer: MEDICAID

## 2022-09-19 NOTE — Progress Notes (Signed)
Patient had a "slip" to the ground per CNA. She did not hit any parts of her body besides her knee. I did not witness the fall. At this time, patient is stable and not having any issues.

## 2022-09-19 NOTE — Progress Notes (Signed)
[facility]                                                                               Natalie Brown, is a 57 y.o. female, DOB - Jun 27, 1965, NWG:[id number] Admit date - 12/24/2021    Outpatient Primary MD for the patient is [name], [name], NP  LOS - 267  days    Brief summary   57 year old with a history of bipolar disorder and chronic dementia with advanced cerebral atrophy who was admitted to the acute units 12/24/2021 following a preceding admission at [facility] 09/06/2021-12/24/2021. She was initially admitted to [facility] with acute mania and aggressive behaviors. On 12/24/2021 she became nearly catatonic and was transferred to the ER and ultimately admitted. During her acute hospital stay she has been evaluated by Neurology and Psychiatry. EEG has revealed no evidence of seizure activity. Repeat brain imaging was unremarkable. It was ultimately determined that her AMS was due to due progression of her severe underlying dementia.    The patient has been deemed medically stable and is awaiting a disposition. The goal is to place her within the memory care unit but this is proving difficult as she is homeless and has no insurance. Per TOC patient's mother plans to go to DSS to apply for special assistance Medicaid.  Patient seen and examined today. RN reported that she slipped to the floor on to the knees, did not hit her head. Will get x rays of the knees.     Assessment & Plan    Assessment and Plan:     Acute Metabolic Encephalopathy on chronic progressive early onset Dementia:  Nonverbal and noninteractive for most part.  Evaluated by neurology and psych, and had extensive workup without explanation. Ultimately it is felt that her current mental status is a result of progressive severe early onset dementia and there is no expectation that it would improve beyond its current point but rather will in fact only decline further with time -Continue Haldol and as needed  olanzapine 5 mg BID prn, hydroxyzine 25 mg TID prn, lorazepam 0.5 mg every 6 hours prn, and mirtazapine  15 mg daily at bedtime -Remains medically stable at this time and remains ready for disposition and difficult to place. -Patent attorney. -Continue dysphagia 3 diet. - in bed playing with her phone    Bipolar Disorder -Psych meds as above.   Vitamin B12 Deficiency:  replacements ordered.    HTN: well controlled.    Tobacco Abuse Resolved.    Sinus Tachycardia: Resolved.  TTE reassuring.  TSH normal. -Continue metoprolol   CKD Stage IIIa: Stable.   Constipation/altered GI function -Bowel regimen   Fall earlier this am:   Did not hit her head.  Witnessed.  X rays of the knees ordered.   Malnutrition Type:  Nutrition Problem: Altered GI function Etiology: constipation   Malnutrition Characteristics:  Signs/Symptoms: other (comment) (last BM 10 days ago)   Nutrition Interventions:  Interventions: Other (Comment) (scheduled bowel regimen per MD)  Estimated body mass index is 19.21 kg/m as calculated from the following:   Height as of this encounter: 5\' 6"  (1.676 m).   Weight as of  this encounter: 54 kg.  Code Status: full code DVT Prophylaxis:  enoxaparin (LOVENOX) injection 40 mg Start: 12/26/21 1200   Level of Care: Level of care: Med-Surg Family Communication: none at bedside.   Disposition Plan:     Remains inpatient appropriate:  safe disposition.   Procedures:  None.   Consultants:   Psychiatry.   Antimicrobials:   Anti-infectives (From admission, onward)    None        Medications  Scheduled Meds:  vitamin B-12  500 mcg Oral Daily   enoxaparin (LOVENOX) injection  40 mg Subcutaneous Q24H   haloperidol  2 mg Oral BID   hydrocerin   Topical BID   metoprolol tartrate  25 mg Oral BID   mirtazapine  15 mg Oral QHS   polyethylene glycol  17 g Oral Daily   senna-docusate  2 tablet Oral QHS   Continuous Infusions:  sodium  chloride     PRN Meds:.acetaminophen, alum & mag hydroxide-simeth, bisacodyl, fluticasone, hydrOXYzine, LORazepam, magnesium hydroxide, OLANZapine **OR** OLANZapine, ondansetron, mouth rinse    Subjective:   Natalie Brown was seen and examined today.  No new complaints.   Objective:   Vitals:   09/18/22 0957 09/18/22 1059 09/18/22 2033 09/19/22 0830  BP: (!) 90/53 101/70 (!) 86/64 117/73  Pulse:  (!) 101 (!) 54 91  Resp:   18 18  Temp: 97.7 F (36.5 C)  98.1 F (36.7 C) 98 F (36.7 C)  TempSrc: Axillary   Axillary  SpO2:   100% 100%  Weight:      Height:        Intake/Output Summary (Last 24 hours) at 09/19/2022 1125 Last data filed at 09/18/2022 1244 Gross per 24 hour  Intake 300 ml  Output --  Net 300 ml    Filed Weights   08/27/22 0500 09/03/22 0812 09/17/22 0812  Weight: 55.3 kg 54.5 kg 54 kg     Exam General exam: Appears calm and comfortable  Respiratory system: Clear to auscultation. Respiratory effort normal. Cardiovascular system: S1 & S2 heard, RRR.  Gastrointestinal system: Abdomen is soft,  Central nervous system: Alert , non verbal.  Extremities: no edema.  Skin: No rashes,  Psychiatry: unable to assess.      Data Reviewed:  I have personally reviewed following labs and imaging studies   CBC Lab Results  Component Value Date   WBC 5.7 09/04/2022   RBC 4.82 09/04/2022   HGB 13.7 09/04/2022   HCT 40.9 09/04/2022   MCV 84.9 09/04/2022   MCH 28.4 09/04/2022   PLT 294 09/04/2022   MCHC 33.5 09/04/2022   RDW 13.2 09/04/2022   LYMPHSABS 1.4 09/04/2022   MONOABS 0.3 09/04/2022   EOSABS 0.1 09/04/2022   BASOSABS 0.0 09/04/2022     Last metabolic panel Lab Results  Component Value Date   NA 138 09/04/2022   K 4.1 09/04/2022   CL 107 09/04/2022   CO2 22 09/04/2022   BUN 15 09/04/2022   CREATININE 1.08 (H) 09/04/2022   GLUCOSE 98 09/04/2022   GFRNONAA >60 09/04/2022   GFRAA 65 09/19/2017   CALCIUM 8.9 09/04/2022   PHOS 3.5  09/04/2022   PROT 6.8 09/04/2022   ALBUMIN 3.6 09/04/2022   LABGLOB 2.8 06/21/2020   AGRATIO 1.5 06/21/2020   BILITOT 0.4 09/04/2022   ALKPHOS 56 09/04/2022   AST 37 09/04/2022   ALT 23 09/04/2022   ANIONGAP 9 09/04/2022    CBG (last 3)  No results for input(s): "GLUCAP" in  the last 72 hours.    Coagulation Profile: No results for input(s): "INR", "PROTIME" in the last 168 hours.   Radiology Studies: No results found.     Kathlen Mody M.D. [facility] 09/19/2022, 11:25 AM  Available via Epic secure chat 7am-7pm After 7 pm, please refer to night coverage provider listed on amion.

## 2022-09-20 NOTE — Progress Notes (Signed)
[facility]                                                                               Natalie Brown, is a 57 y.o. female, DOB - 1965-10-01, ZOX:[id number] Admit date - 12/24/2021    Outpatient Primary MD for the patient is [name], [name], NP  LOS - 268  days    Brief summary   57 year old with a history of bipolar disorder and chronic dementia with advanced cerebral atrophy who was admitted to the acute units 12/24/2021 following a preceding admission at [facility] 09/06/2021-12/24/2021. She was initially admitted to [facility] with acute mania and aggressive behaviors. On 12/24/2021 she became nearly catatonic and was transferred to the ER and ultimately admitted. During her acute hospital stay she has been evaluated by Neurology and Psychiatry. EEG has revealed no evidence of seizure activity. Repeat brain imaging was unremarkable. It was ultimately determined that her AMS was due to due progression of her severe underlying dementia.    The patient has been deemed medically stable and is awaiting a disposition. The goal is to place her within the memory care unit but this is proving difficult as she is homeless and has no insurance. Per TOC patient's mother plans to go to DSS to apply for special assistance Medicaid.  Patient seen and examined today. RN reported that she slipped to the floor on to the knees, did not hit her head. Will get x rays of the knees. X rays knees are negative for acute finding.    Assessment & Plan    Assessment and Plan:     Acute Metabolic Encephalopathy on chronic progressive early onset Dementia:  Nonverbal and noninteractive for most part.  Evaluated by neurology and psych, and had extensive workup without explanation. Ultimately it is felt that her current mental status is a result of progressive severe early onset dementia and there is no expectation that it would improve beyond its current point but rather will in fact only decline further  with time -Continue Haldol and as needed olanzapine 5 mg BID prn, hydroxyzine 25 mg TID prn, lorazepam 0.5 mg every 6 hours prn, and mirtazapine  15 mg daily at bedtime -Remains medically stable at this time and remains ready for disposition and difficult to place. -Patent attorney. -Continue dysphagia 3 diet. - no new events overnight.    Bipolar Disorder -Psych meds as above.   Vitamin B12 Deficiency:  replacements ordered.    HTN: well controlled.    Tobacco Abuse Resolved.    Sinus Tachycardia: Resolved.  TTE reassuring.  TSH normal. -Continue metoprolol   CKD Stage IIIa: Stable.   Constipation/altered GI function -Bowel regimen   Fall earlier this am:   Did not hit her head.  Witnessed.  X rays of the knees ordered and negative for acute findings.   Malnutrition Type:  Nutrition Problem: Altered GI function Etiology: constipation   Malnutrition Characteristics:  Signs/Symptoms: other (comment) (last BM 10 days ago)   Nutrition Interventions:  Interventions: Other (Comment) (scheduled bowel regimen per MD)  Estimated body mass index is 19.21 kg/m as calculated from the following:   Height as of this  encounter: 5\' 6"  (1.676 m).   Weight as of this encounter: 54 kg.  Code Status: full code DVT Prophylaxis:  enoxaparin (LOVENOX) injection 40 mg Start: 12/26/21 1200   Level of Care: Level of care: Med-Surg Family Communication: none at bedside.   Disposition Plan:     Remains inpatient appropriate:  safe disposition.   Procedures:  None.   Consultants:   Psychiatry.   Antimicrobials:   Anti-infectives (From admission, onward)    None        Medications  Scheduled Meds:  vitamin B-12  500 mcg Oral Daily   enoxaparin (LOVENOX) injection  40 mg Subcutaneous Q24H   haloperidol  2 mg Oral BID   hydrocerin   Topical BID   metoprolol tartrate  25 mg Oral BID   mirtazapine  15 mg Oral QHS   polyethylene glycol  17 g Oral Daily    senna-docusate  2 tablet Oral QHS   Continuous Infusions:  sodium chloride     PRN Meds:.acetaminophen, alum & mag hydroxide-simeth, bisacodyl, fluticasone, hydrOXYzine, LORazepam, magnesium hydroxide, OLANZapine **OR** OLANZapine, ondansetron, mouth rinse    Subjective:   Natalie Brown was seen and examined today.  No new complaints.   Objective:   Vitals:   09/19/22 1628 09/19/22 2120 09/20/22 0816 09/20/22 0925  BP: (!) 108/93 94/71 104/70 94/60  Pulse: 100 98 (!) 108 85  Resp: 16 16 18    Temp: 97.7 F (36.5 C) 98.6 F (37 C) 97.9 F (36.6 C)   TempSrc: Oral Oral Oral   SpO2: 100% 100% 96%   Weight:      Height:        Intake/Output Summary (Last 24 hours) at 09/20/2022 1433 Last data filed at 09/20/2022 0836 Gross per 24 hour  Intake 236 ml  Output --  Net 236 ml    Filed Weights   08/27/22 0500 09/03/22 0812 09/17/22 0812  Weight: 55.3 kg 54.5 kg 54 kg     Exam General exam: Appears calm and comfortable  Respiratory system: Clear to auscultation. Respiratory effort normal. Cardiovascular system: S1 & S2 heard, RRR.  Gastrointestinal system: Abdomen is nondistended, soft and nontender.  Central nervous system: non interactive.  Extremities: Symmetric 5 x 5 power. Skin: No rashes, lesions or ulcers Psychiatry: unable to assess.       Data Reviewed:  I have personally reviewed following labs and imaging studies   CBC Lab Results  Component Value Date   WBC 5.7 09/04/2022   RBC 4.82 09/04/2022   HGB 13.7 09/04/2022   HCT 40.9 09/04/2022   MCV 84.9 09/04/2022   MCH 28.4 09/04/2022   PLT 294 09/04/2022   MCHC 33.5 09/04/2022   RDW 13.2 09/04/2022   LYMPHSABS 1.4 09/04/2022   MONOABS 0.3 09/04/2022   EOSABS 0.1 09/04/2022   BASOSABS 0.0 09/04/2022     Last metabolic panel Lab Results  Component Value Date   NA 138 09/04/2022   K 4.1 09/04/2022   CL 107 09/04/2022   CO2 22 09/04/2022   BUN 15 09/04/2022   CREATININE 1.08 (H)  09/04/2022   GLUCOSE 98 09/04/2022   GFRNONAA >60 09/04/2022   GFRAA 65 09/19/2017   CALCIUM 8.9 09/04/2022   PHOS 3.5 09/04/2022   PROT 6.8 09/04/2022   ALBUMIN 3.6 09/04/2022   LABGLOB 2.8 06/21/2020   AGRATIO 1.5 06/21/2020   BILITOT 0.4 09/04/2022   ALKPHOS 56 09/04/2022   AST 37 09/04/2022   ALT 23 09/04/2022   ANIONGAP 9  09/04/2022    CBG (last 3)  No results for input(s): "GLUCAP" in the last 72 hours.    Coagulation Profile: No results for input(s): "INR", "PROTIME" in the last 168 hours.   Radiology Studies: DG Knee 1-2 Views Right  Result Date: 09/19/2022 CLINICAL DATA:  Fall. EXAM: RIGHT KNEE - 1-2 VIEW COMPARISON:  None Available. FINDINGS: No joint effusion. Mild superior patellar degenerative osteophytosis. Minimal medial compartment joint space narrowing with minimal peripheral medial component degenerative osteophytosis. No acute fracture or dislocation. IMPRESSION: Minimal medial and patellofemoral compartment osteoarthritis. Electronically Signed   By: Neita Garnet M.D.   On: 09/19/2022 15:06   DG Knee 1-2 Views Left  Result Date: 09/19/2022 CLINICAL DATA:  Fall. EXAM: LEFT KNEE - 1-2 VIEW COMPARISON:  None Available. FINDINGS: Mild chronic enthesopathic change at the quadriceps insertion on the patella. No joint effusion. Minimal medial compartment joint space narrowing. No acute fracture or dislocation. IMPRESSION: Minimal medial compartment joint space narrowing. Electronically Signed   By: Neita Garnet M.D.   On: 09/19/2022 15:05       Kathlen Mody M.D. [facility] 09/20/2022, 2:33 PM  Available via Epic secure chat 7am-7pm After 7 pm, please refer to night coverage provider listed on amion.

## 2022-09-20 NOTE — Plan of Care (Signed)

## 2022-09-21 NOTE — Progress Notes (Signed)
[facility]                                                                               Natalie Brown, is a 57 y.o. female, DOB - Jan 08, 1966, ZOX:[id number] Admit date - 12/24/2021    Outpatient Primary MD for the patient is [name], [name], NP  LOS - 269  days    Brief summary   57 year old with a history of bipolar disorder and chronic dementia with advanced cerebral atrophy who was admitted to the acute units 12/24/2021 following a preceding admission at [facility] 09/06/2021-12/24/2021. She was initially admitted to [facility] with acute mania and aggressive behaviors. On 12/24/2021 she became nearly catatonic and was transferred to the ER and ultimately admitted. During her acute hospital stay she has been evaluated by Neurology and Psychiatry. EEG has revealed no evidence of seizure activity. Repeat brain imaging was unremarkable. It was ultimately determined that her AMS was due to due progression of her severe underlying dementia.    The patient has been deemed medically stable and is awaiting a disposition. The goal is to place her within the memory care unit but this is proving difficult as she is homeless and has no insurance. Per TOC patient's mother plans to go to DSS to apply for special assistance Medicaid.  No new complaints overnight.     Assessment & Plan    Assessment and Plan:     Acute Metabolic Encephalopathy on chronic progressive early onset Dementia:  Nonverbal and noninteractive for most part.  Evaluated by neurology and psych, and had extensive workup without explanation. Ultimately it is felt that her current mental status is a result of progressive severe early onset dementia and there is no expectation that it would improve beyond its current point but rather Brown in fact only decline further with time -Continue Haldol and as needed olanzapine 5 mg BID prn, hydroxyzine 25 mg TID prn, lorazepam 0.5 mg every 6 hours prn, and mirtazapine  15 mg daily  at bedtime -Remains medically stable at this time and remains ready for disposition and difficult to place. -Patent attorney. -Continue dysphagia 3 diet. - no new events overnight.    Bipolar Disorder -Psych meds as above.   Vitamin B12 Deficiency:  replacements ordered.    HTN: optimal.    Tobacco Abuse Resolved.    Sinus Tachycardia: Resolved.  TTE reassuring.  TSH normal. -Continue metoprolol   CKD Stage IIIa: Stable.   Constipation/altered GI function -Bowel regimen   Fall.   Did not hit her head.  Witnessed.  X rays of the knees ordered and negative for acute findings.   Malnutrition Type:  Nutrition Problem: Altered GI function Etiology: constipation   Malnutrition Characteristics:  Signs/Symptoms: other (comment) (last BM 10 days ago)   Nutrition Interventions:  Interventions: Other (Comment) (scheduled bowel regimen per MD)  Estimated body mass index is 19.21 kg/m as calculated from the following:   Height as of this encounter: 5\' 6"  (1.676 m).   Weight as of this encounter: 54 kg.  Code Status: full code DVT Prophylaxis:  enoxaparin (LOVENOX) injection 40 mg Start: 12/26/21 1200   Level of Care: Level  of care: Med-Surg Family Communication: none at bedside.   Disposition Plan:     Remains inpatient appropriate:  safe disposition.   Procedures:  None.   Consultants:   Psychiatry.   Antimicrobials:   Anti-infectives (From admission, onward)    None        Medications  Scheduled Meds:  vitamin B-12  500 mcg Oral Daily   enoxaparin (LOVENOX) injection  40 mg Subcutaneous Q24H   haloperidol  2 mg Oral BID   hydrocerin   Topical BID   metoprolol tartrate  25 mg Oral BID   mirtazapine  15 mg Oral QHS   polyethylene glycol  17 g Oral Daily   senna-docusate  2 tablet Oral QHS   Continuous Infusions:  sodium chloride     PRN Meds:.acetaminophen, alum & mag hydroxide-simeth, bisacodyl, fluticasone, hydrOXYzine, LORazepam,  magnesium hydroxide, OLANZapine **OR** OLANZapine, ondansetron, mouth rinse    Subjective:   Natalie Brown was seen and examined today.  No new complaints.   Objective:   Vitals:   09/19/22 1628 09/19/22 2120 09/20/22 0816 09/20/22 0925  BP: (!) 108/93 94/71 104/70 94/60  Pulse: 100 98 (!) 108 85  Resp: 16 16 18    Temp: 97.7 F (36.5 C) 98.6 F (37 C) 97.9 F (36.6 C)   TempSrc: Oral Oral Oral   SpO2: 100% 100% 96%   Weight:      Height:        Intake/Output Summary (Last 24 hours) at 09/21/2022 1255 Last data filed at 09/20/2022 1912 Gross per 24 hour  Intake 238 ml  Output --  Net 238 ml    Filed Weights   08/27/22 0500 09/03/22 0812 09/17/22 0812  Weight: 55.3 kg 54.5 kg 54 kg     Exam General exam: Appears calm and comfortable  Respiratory system: Clear to auscultation. Respiratory effort normal. Cardiovascular system: S1 & S2 heard, RRR. No JVD,  Gastrointestinal system: Abdomen is nondistended, soft and nontender.  Central nervous system: Alert , non interactive.  Extremities: Symmetric 5 x 5 power. Skin: No rashes,  Psychiatry:  Mood & affect appropriate.        Data Reviewed:  I have personally reviewed following labs and imaging studies   CBC Lab Results  Component Value Date   WBC 5.7 09/04/2022   RBC 4.82 09/04/2022   HGB 13.7 09/04/2022   HCT 40.9 09/04/2022   MCV 84.9 09/04/2022   MCH 28.4 09/04/2022   PLT 294 09/04/2022   MCHC 33.5 09/04/2022   RDW 13.2 09/04/2022   LYMPHSABS 1.4 09/04/2022   MONOABS 0.3 09/04/2022   EOSABS 0.1 09/04/2022   BASOSABS 0.0 09/04/2022     Last metabolic panel Lab Results  Component Value Date   NA 138 09/04/2022   K 4.1 09/04/2022   CL 107 09/04/2022   CO2 22 09/04/2022   BUN 15 09/04/2022   CREATININE 1.08 (H) 09/04/2022   GLUCOSE 98 09/04/2022   GFRNONAA >60 09/04/2022   GFRAA 65 09/19/2017   CALCIUM 8.9 09/04/2022   PHOS 3.5 09/04/2022   PROT 6.8 09/04/2022   ALBUMIN 3.6 09/04/2022    LABGLOB 2.8 06/21/2020   AGRATIO 1.5 06/21/2020   BILITOT 0.4 09/04/2022   ALKPHOS 56 09/04/2022   AST 37 09/04/2022   ALT 23 09/04/2022   ANIONGAP 9 09/04/2022    CBG (last 3)  No results for input(s): "GLUCAP" in the last 72 hours.    Coagulation Profile: No results for input(s): "INR", "PROTIME" in the  last 168 hours.   Radiology Studies: DG Knee 1-2 Views Right  Result Date: 09/19/2022 CLINICAL DATA:  Fall. EXAM: RIGHT KNEE - 1-2 VIEW COMPARISON:  None Available. FINDINGS: No joint effusion. Mild superior patellar degenerative osteophytosis. Minimal medial compartment joint space narrowing with minimal peripheral medial component degenerative osteophytosis. No acute fracture or dislocation. IMPRESSION: Minimal medial and patellofemoral compartment osteoarthritis. Electronically Signed   By: Neita Garnet M.D.   On: 09/19/2022 15:06   DG Knee 1-2 Views Left  Result Date: 09/19/2022 CLINICAL DATA:  Fall. EXAM: LEFT KNEE - 1-2 VIEW COMPARISON:  None Available. FINDINGS: Mild chronic enthesopathic change at the quadriceps insertion on the patella. No joint effusion. Minimal medial compartment joint space narrowing. No acute fracture or dislocation. IMPRESSION: Minimal medial compartment joint space narrowing. Electronically Signed   By: Neita Garnet M.D.   On: 09/19/2022 15:05       Kathlen Mody M.D. [facility] 09/21/2022, 12:55 PM  Available via Epic secure chat 7am-7pm After 7 pm, please refer to night coverage provider listed on amion.

## 2022-09-22 MED ORDER — METOPROLOL TARTRATE 12.5 MG HALF TABLET
12.5000 mg | ORAL_TABLET | Freq: Two times a day (BID) | ORAL | Status: DC
  Administered 2022-09-22: 12.5 mg via ORAL
  Filled 2022-09-22 (×2): qty 1

## 2022-09-22 NOTE — Progress Notes (Signed)
[facility]                                                                               Natalie Brown, is a 57 y.o. female, DOB - [birth year], ZOX:[id number] Admit date - 12/24/2021    Outpatient Primary MD for the patient is [name], [name], NP  LOS - 270  days    Brief summary   57 year old with a history of bipolar disorder and chronic dementia with advanced cerebral atrophy who was admitted to the acute units 12/24/2021 following a preceding admission at [facility] 09/06/2021-12/24/2021. She was initially admitted to [facility] with acute mania and aggressive behaviors. On 12/24/2021 she became nearly catatonic and was transferred to the ER and ultimately admitted. During her acute hospital stay she has been evaluated by Neurology and Psychiatry. EEG has revealed no evidence of seizure activity. Repeat brain imaging was unremarkable. It was ultimately determined that her AMS was due to due progression of her severe underlying dementia.    The patient has been deemed medically stable and is awaiting a disposition. The goal is to place her within the memory care unit but this is proving difficult as she is homeless and has no insurance. Per TOC patient's mother plans to go to DSS to apply for special assistance Medicaid.  No new complaints.     Assessment & Plan    Assessment and Plan:     Acute Metabolic Encephalopathy on chronic progressive early onset Dementia:  Nonverbal and noninteractive for most part.  Evaluated by neurology and psych, and had extensive workup without explanation. Ultimately it is felt that her current mental status is a result of progressive severe early onset dementia and there is no expectation that it would improve beyond its current point but rather will in fact only decline further with time -Continue Haldol and as needed olanzapine 5 mg BID prn, hydroxyzine 25 mg TID prn, lorazepam 0.5 mg every 6 hours prn, and mirtazapine  15 mg daily at  bedtime -Remains medically stable at this time and remains ready for disposition and difficult to place. -Patent attorney. -Continue dysphagia 3 diet.    Bipolar Disorder -Psych meds as above.   Vitamin B12 Deficiency:  replacements ordered.    HTN: BP parameters are borderline low. Decrease metoprolol 12.5 mg BID Continue to monitor.    Tobacco Abuse Resolved.    Sinus Tachycardia: Resolved.  TTE reassuring.  TSH normal. CONTINUE with metoprolol.     CKD Stage IIIa: Stable.   Constipation/altered GI function -Bowel regimen   Fall.   Did not hit her head.  Witnessed.  X rays of the knees ordered and negative for acute findings.   Malnutrition Type:  Nutrition Problem: Altered GI function Etiology: constipation   Malnutrition Characteristics:  Signs/Symptoms: other (comment) (last BM 10 days ago)   Nutrition Interventions:  Interventions: Other (Comment) (scheduled bowel regimen per MD)  Estimated body mass index is 19.21 kg/m as calculated from the following:   Height as of this encounter: 5\' 6"  (1.676 m).   Weight as of this encounter: 54 kg.  Code Status: full code DVT Prophylaxis:  enoxaparin (LOVENOX) injection 40 mg  Start: 12/26/21 1200   Level of Care: Level of care: Med-Surg Family Communication: none at bedside.   Disposition Plan:     Remains inpatient appropriate:  safe disposition.   Procedures:  None.   Consultants:   Psychiatry.   Antimicrobials:   Anti-infectives (From admission, onward)    None        Medications  Scheduled Meds:  vitamin B-12  500 mcg Oral Daily   enoxaparin (LOVENOX) injection  40 mg Subcutaneous Q24H   haloperidol  2 mg Oral BID   hydrocerin   Topical BID   metoprolol tartrate  25 mg Oral BID   mirtazapine  15 mg Oral QHS   polyethylene glycol  17 g Oral Daily   senna-docusate  2 tablet Oral QHS   Continuous Infusions:  sodium chloride     PRN Meds:.acetaminophen, alum & mag  hydroxide-simeth, bisacodyl, fluticasone, hydrOXYzine, LORazepam, magnesium hydroxide, OLANZapine **OR** OLANZapine, ondansetron, mouth rinse    Subjective:   Natalie Brown was seen and examined today.  No new complaints.   Objective:   Vitals:   09/20/22 0925 09/21/22 1300 09/21/22 1947 09/22/22 0425  BP: 94/60 110/62 (!) 85/64 (!) 93/58  Pulse: 85 68 72 (!) 51  Resp:  18 16 20   Temp:  (!) 97 F (36.1 C) 98.2 F (36.8 C) 98.4 F (36.9 C)  TempSrc:  Oral Oral   SpO2:  95% 100% 100%  Weight:      Height:        Intake/Output Summary (Last 24 hours) at 09/22/2022 1232 Last data filed at 09/22/2022 0655 Gross per 24 hour  Intake 220 ml  Output 0 ml  Net 220 ml    Filed Weights   08/27/22 0500 09/03/22 0812 09/17/22 0812  Weight: 55.3 kg 54.5 kg 54 kg     Exam General exam: Appears calm and comfortable  Respiratory system: Clear to auscultation. Respiratory effort normal. Cardiovascular system: S1 & S2 heard, RRR. No JVD,  Gastrointestinal system: Abdomen is nondistended, soft and nontender.  Central nervous system: Alert , non interactive.  Extremities: Symmetric 5 x 5 power. Skin: No rashes,  Psychiatry:  Mood & affect appropriate.        Data Reviewed:  I have personally reviewed following labs and imaging studies   CBC Lab Results  Component Value Date   WBC 5.7 09/04/2022   RBC 4.82 09/04/2022   HGB 13.7 09/04/2022   HCT 40.9 09/04/2022   MCV 84.9 09/04/2022   MCH 28.4 09/04/2022   PLT 294 09/04/2022   MCHC 33.5 09/04/2022   RDW 13.2 09/04/2022   LYMPHSABS 1.4 09/04/2022   MONOABS 0.3 09/04/2022   EOSABS 0.1 09/04/2022   BASOSABS 0.0 09/04/2022     Last metabolic panel Lab Results  Component Value Date   NA 138 09/04/2022   K 4.1 09/04/2022   CL 107 09/04/2022   CO2 22 09/04/2022   BUN 15 09/04/2022   CREATININE 1.08 (H) 09/04/2022   GLUCOSE 98 09/04/2022   GFRNONAA >60 09/04/2022   GFRAA 65 09/19/2017   CALCIUM 8.9 09/04/2022    PHOS 3.5 09/04/2022   PROT 6.8 09/04/2022   ALBUMIN 3.6 09/04/2022   LABGLOB 2.8 06/21/2020   AGRATIO 1.5 06/21/2020   BILITOT 0.4 09/04/2022   ALKPHOS 56 09/04/2022   AST 37 09/04/2022   ALT 23 09/04/2022   ANIONGAP 9 09/04/2022    CBG (last 3)  No results for input(s): "GLUCAP" in the last 72 hours.  Coagulation Profile: No results for input(s): "INR", "PROTIME" in the last 168 hours.   Radiology Studies: No results found.     Kathlen Mody M.D. [facility] 09/22/2022, 12:32 PM  Available via Epic secure chat 7am-7pm After 7 pm, please refer to night coverage provider listed on amion.

## 2022-09-23 NOTE — Progress Notes (Signed)
[facility]                                                                               Natalie Brown, is a 57 y.o. female, DOB - 12-Jul-1965, UJW:[id number] Admit date - 12/24/2021    Outpatient Primary MD for the patient is [name], [name], NP  LOS - 271  days    Brief summary   57 year old with a history of bipolar disorder and chronic dementia with advanced cerebral atrophy who was admitted to the acute units 12/24/2021 following a preceding admission at [facility] 09/06/2021-12/24/2021. She was initially admitted to [facility] with acute mania and aggressive behaviors. On 12/24/2021 she became nearly catatonic and was transferred to the ER and ultimately admitted. During her acute hospital stay she has been evaluated by Neurology and Psychiatry. EEG has revealed no evidence of seizure activity. Repeat brain imaging was unremarkable. It was ultimately determined that her AMS was due to due progression of her severe underlying dementia.    The patient has been deemed medically stable and is awaiting a disposition. The goal is to place her within the memory care unit but this is proving difficult as she is homeless and has no insurance. Per TOC patient's mother plans to go to DSS to apply for special assistance Medicaid.  No new complaints.     Assessment & Plan    Assessment and Plan:     Acute Metabolic Encephalopathy on chronic progressive early onset Dementia:  Nonverbal and noninteractive for most part.  Evaluated by neurology and psych, and had extensive workup without explanation. Ultimately it is felt that her current mental status is a result of progressive severe early onset dementia and there is no expectation that it would improve beyond its current point but rather will in fact only decline further with time -Continue Haldol and as needed olanzapine 5 mg BID prn, hydroxyzine 25 mg TID prn, lorazepam 0.5 mg every 6 hours prn, and mirtazapine  15 mg daily at  bedtime -Remains medically stable at this time and remains ready for disposition and difficult to place. -Patent attorney. -Continue dysphagia 3 diet.    Bipolar Disorder -Psych meds as above.   Vitamin B12 Deficiency:  replacements ordered.    HTN: BP parameters are borderline low. Decrease metoprolol 12.5 mg BID Continue to monitor.    Tobacco Abuse Resolved.    Sinus Tachycardia: Resolved.  TTE reassuring.  TSH normal. Bradycardic, this morning, stopped metoprolol.     CKD Stage IIIa: Stable.   Constipation/altered GI function -Bowel regimen   Fall.   Did not hit her head.  Witnessed.  X rays of the knees ordered and negative for acute findings.   Malnutrition Type:  Nutrition Problem: Altered GI function Etiology: constipation   Malnutrition Characteristics:  Signs/Symptoms: other (comment) (last BM 10 days ago)   Nutrition Interventions:  Interventions: Other (Comment) (scheduled bowel regimen per MD)  Estimated body mass index is 19.21 kg/m as calculated from the following:   Height as of this encounter: 5\' 6"  (1.676 m).   Weight as of this encounter: 54 kg.  Code Status: full code DVT Prophylaxis:  enoxaparin (LOVENOX) injection  40 mg Start: 12/26/21 1200   Level of Care: Level of care: Med-Surg Family Communication: none at bedside.   Disposition Plan:     Remains inpatient appropriate:  safe disposition.   Procedures:  None.   Consultants:   Psychiatry.   Antimicrobials:   Anti-infectives (From admission, onward)    None        Medications  Scheduled Meds:  vitamin B-12  500 mcg Oral Daily   enoxaparin (LOVENOX) injection  40 mg Subcutaneous Q24H   haloperidol  2 mg Oral BID   hydrocerin   Topical BID   metoprolol tartrate  12.5 mg Oral BID   mirtazapine  15 mg Oral QHS   polyethylene glycol  17 g Oral Daily   senna-docusate  2 tablet Oral QHS   Continuous Infusions:  sodium chloride     PRN  Meds:.acetaminophen, alum & mag hydroxide-simeth, bisacodyl, fluticasone, hydrOXYzine, LORazepam, magnesium hydroxide, OLANZapine **OR** OLANZapine, ondansetron, mouth rinse    Subjective:   Natalie Brown was seen and examined today.  No new complaints.   Objective:   Vitals:   09/22/22 0425 09/22/22 2024 09/23/22 0647 09/23/22 0814  BP: (!) 93/58 (!) 91/57 (!) 96/53 117/65  Pulse: (!) 51 83 (!) 55 (!) 58  Resp: 20 16 16 18   Temp: 98.4 F (36.9 C) 98.5 F (36.9 C) 98.5 F (36.9 C) 98.3 F (36.8 C)  TempSrc:  Oral Oral   SpO2: 100% 100%    Weight:      Height:       No intake or output data in the 24 hours ending 09/23/22 0900  Filed Weights   08/27/22 0500 09/03/22 0812 09/17/22 0812  Weight: 55.3 kg 54.5 kg 54 kg     Exam General exam: Appears calm and comfortable  Respiratory system: Clear to auscultation. Respiratory effort normal. Cardiovascular system: S1 & S2 heard, RRR.  Gastrointestinal system: Abdomen is nondistended, soft and nontender.  Central nervous system: Alert not interactive .  Extremities: Symmetric 5 x 5 power. Skin: No rashes, Psychiatry: unable to assess        Data Reviewed:  I have personally reviewed following labs and imaging studies   CBC Lab Results  Component Value Date   WBC 5.7 09/04/2022   RBC 4.82 09/04/2022   HGB 13.7 09/04/2022   HCT 40.9 09/04/2022   MCV 84.9 09/04/2022   MCH 28.4 09/04/2022   PLT 294 09/04/2022   MCHC 33.5 09/04/2022   RDW 13.2 09/04/2022   LYMPHSABS 1.4 09/04/2022   MONOABS 0.3 09/04/2022   EOSABS 0.1 09/04/2022   BASOSABS 0.0 09/04/2022     Last metabolic panel Lab Results  Component Value Date   NA 138 09/04/2022   K 4.1 09/04/2022   CL 107 09/04/2022   CO2 22 09/04/2022   BUN 15 09/04/2022   CREATININE 1.08 (H) 09/04/2022   GLUCOSE 98 09/04/2022   GFRNONAA >60 09/04/2022   GFRAA 65 09/19/2017   CALCIUM 8.9 09/04/2022   PHOS 3.5 09/04/2022   PROT 6.8 09/04/2022   ALBUMIN 3.6  09/04/2022   LABGLOB 2.8 06/21/2020   AGRATIO 1.5 06/21/2020   BILITOT 0.4 09/04/2022   ALKPHOS 56 09/04/2022   AST 37 09/04/2022   ALT 23 09/04/2022   ANIONGAP 9 09/04/2022    CBG (last 3)  No results for input(s): "GLUCAP" in the last 72 hours.    Coagulation Profile: No results for input(s): "INR", "PROTIME" in the last 168 hours.   Radiology Studies:  No results found.     Kathlen Mody M.D. [facility] 09/23/2022, 9:00 AM  Available via Epic secure chat 7am-7pm After 7 pm, please refer to night coverage provider listed on amion.

## 2022-09-24 LAB — GLUCOSE, CAPILLARY: Glucose-Capillary: 96 mg/dL (ref 70–99)

## 2022-09-24 NOTE — Progress Notes (Signed)
[facility]                                                                               Natalie Brown, is a 57 y.o. female, DOB - May 08, 1965, NGE:[id number] Admit date - 12/24/2021    Outpatient Primary MD for the patient is [name], [name], NP  LOS - 272  days    Brief summary   57 year old with a history of bipolar disorder and chronic dementia with advanced cerebral atrophy who was admitted to the acute units 12/24/2021 following a preceding admission at [facility] 09/06/2021-12/24/2021. She was initially admitted to [facility] with acute mania and aggressive behaviors. On 12/24/2021 she became nearly catatonic and was transferred to the ER and ultimately admitted. During her acute hospital stay she has been evaluated by Neurology and Psychiatry. EEG has revealed no evidence of seizure activity. Repeat brain imaging was unremarkable. It was ultimately determined that her AMS was due to due progression of her severe underlying dementia.    The patient has been deemed medically stable and is awaiting a disposition. The goal is to place her within the memory care unit but this is proving difficult as she is homeless and has no insurance. Per TOC patient's mother plans to go to DSS to apply for special assistance Medicaid.  No new events this week.     Assessment & Plan    Assessment and Plan:     Acute Metabolic Encephalopathy on chronic progressive early onset Dementia:  Nonverbal and noninteractive for most part.  Evaluated by neurology and psych, and had extensive workup without explanation. Ultimately it is felt that her current mental status is a result of progressive severe early onset dementia and there is no expectation that it would improve beyond its current point but rather will in fact only decline further with time -Continue Haldol and as needed olanzapine 5 mg BID prn, hydroxyzine 25 mg TID prn, lorazepam 0.5 mg every 6 hours prn, and mirtazapine  15 mg daily at  bedtime -Remains medically stable at this time and remains ready for disposition and difficult to place. -Patent attorney. -Continue dysphagia 3 diet.    Bipolar Disorder -Psych meds as above.   Vitamin B12 Deficiency:  replacements ordered.    HTN: well controlled BP parameters.    Tobacco Abuse Resolved.    Sinus Tachycardia: Resolved.  TTE reassuring.  TSH normal. Bradycardic, stopped metoprolol.     CKD Stage IIIa: Stable.   Constipation/altered GI function -Bowel regimen   Fall.   Did not hit her head.  Witnessed.  X rays of the knees ordered and negative for acute findings.   Malnutrition Type:  Nutrition Problem: Altered GI function Etiology: constipation   Malnutrition Characteristics:  Signs/Symptoms: other (comment) (last BM 10 days ago)   Nutrition Interventions:  Interventions: Other (Comment) (scheduled bowel regimen per MD)  Estimated body mass index is 19.21 kg/m as calculated from the following:   Height as of this encounter: 5\' 6"  (1.676 m).   Weight as of this encounter: 54 kg.  Code Status: full code DVT Prophylaxis:  enoxaparin (LOVENOX) injection 40 mg Start: 12/26/21 1200   Level of  Care: Level of care: Med-Surg Family Communication: none at bedside.   Disposition Plan:     Remains inpatient appropriate:  safe disposition.   Procedures:  None.   Consultants:   Psychiatry.   Antimicrobials:   Anti-infectives (From admission, onward)    None        Medications  Scheduled Meds:  vitamin B-12  500 mcg Oral Daily   enoxaparin (LOVENOX) injection  40 mg Subcutaneous Q24H   haloperidol  2 mg Oral BID   hydrocerin   Topical BID   mirtazapine  15 mg Oral QHS   polyethylene glycol  17 g Oral Daily   senna-docusate  2 tablet Oral QHS   Continuous Infusions:  sodium chloride     PRN Meds:.acetaminophen, alum & mag hydroxide-simeth, bisacodyl, fluticasone, hydrOXYzine, LORazepam, magnesium hydroxide, OLANZapine  **OR** OLANZapine, ondansetron, mouth rinse    Subjective:   Natalie Brown was seen and examined today.   No events overnight.   Objective:   Vitals:   09/23/22 0814 09/23/22 2034 09/24/22 0609 09/24/22 0854  BP: 117/65 112/67 124/61 115/71  Pulse: (!) 58 68 76 80  Resp: 18 18 18 18   Temp: 98.3 F (36.8 C) 97.6 F (36.4 C) 98.2 F (36.8 C) 98.3 F (36.8 C)  TempSrc:      SpO2:  100% 100% 100%  Weight:      Height:        Intake/Output Summary (Last 24 hours) at 09/24/2022 1354 Last data filed at 09/23/2022 1800 Gross per 24 hour  Intake 480 ml  Output --  Net 480 ml    Filed Weights   08/27/22 0500 09/03/22 0812 09/17/22 0812  Weight: 55.3 kg 54.5 kg 54 kg     Exam General exam: Appears calm and comfortable  Respiratory system: Clear to auscultation. Respiratory effort normal. Cardiovascular system: S1 & S2 heard, RRR.  Gastrointestinal system: Abdomen is nondistended, soft and nontender.  Central nervous system: Alert not interactive .  Extremities: Symmetric 5 x 5 power. Skin: No rashes, Psychiatry: unable to assess        Data Reviewed:  I have personally reviewed following labs and imaging studies   CBC Lab Results  Component Value Date   WBC 5.7 09/04/2022   RBC 4.82 09/04/2022   HGB 13.7 09/04/2022   HCT 40.9 09/04/2022   MCV 84.9 09/04/2022   MCH 28.4 09/04/2022   PLT 294 09/04/2022   MCHC 33.5 09/04/2022   RDW 13.2 09/04/2022   LYMPHSABS 1.4 09/04/2022   MONOABS 0.3 09/04/2022   EOSABS 0.1 09/04/2022   BASOSABS 0.0 09/04/2022     Last metabolic panel Lab Results  Component Value Date   NA 138 09/04/2022   K 4.1 09/04/2022   CL 107 09/04/2022   CO2 22 09/04/2022   BUN 15 09/04/2022   CREATININE 1.08 (H) 09/04/2022   GLUCOSE 98 09/04/2022   GFRNONAA >60 09/04/2022   GFRAA 65 09/19/2017   CALCIUM 8.9 09/04/2022   PHOS 3.5 09/04/2022   PROT 6.8 09/04/2022   ALBUMIN 3.6 09/04/2022   LABGLOB 2.8 06/21/2020   AGRATIO 1.5  06/21/2020   BILITOT 0.4 09/04/2022   ALKPHOS 56 09/04/2022   AST 37 09/04/2022   ALT 23 09/04/2022   ANIONGAP 9 09/04/2022    CBG (last 3)  Recent Labs    09/23/22 2355  GLUCAP 96      Coagulation Profile: No results for input(s): "INR", "PROTIME" in the last 168 hours.   Radiology Studies:  No results found.     Kathlen Mody M.D. [facility] 09/24/2022, 1:54 PM  Available via Epic secure chat 7am-7pm After 7 pm, please refer to night coverage provider listed on amion.

## 2022-09-24 NOTE — TOC Progression Note (Signed)
Transition of Care [facility]) - Progression Note    Patient Details  Name: Natalie Brown MRN: [id number] Date of Birth: Nov 30, 1965  Transition of Care [facility]) CM/SW Contact  Inis Sizer, LCSW Phone Number: 09/24/2022, 12:20 PM  Clinical Narrative:    CSW spoke with Scottie at [facility] who states the facility does not accept patient's under the age of 42.   Barriers to Discharge: Financial Resources, Inadequate or no insurance, Homeless with medical needs  Expected Discharge Plan and Services In-house Referral: Clinical Social Work   Post Acute Care Choice: Nursing Home Living arrangements for the past 2 months: Homeless [facility]) Expected Discharge Date: 12/24/21                                     Social Determinants of Health (SDOH) Interventions SDOH Screenings   Food Insecurity: No Food Insecurity (03/05/2022)  Housing: High Risk (03/21/2022)  Alcohol Screen: Low Risk  (09/06/2021)  Depression (PHQ2-9): Medium Risk (05/26/2020)  Tobacco Use: High Risk (03/21/2022)    Readmission Risk Interventions     No data to display

## 2022-09-25 LAB — BASIC METABOLIC PANEL
Anion gap: 15 (ref 5–15)
BUN: 13 mg/dL (ref 6–20)
CO2: 24 mmol/L (ref 22–32)
Calcium: 9.5 mg/dL (ref 8.9–10.3)
Chloride: 99 mmol/L (ref 98–111)
Creatinine, Ser: 1.21 mg/dL — ABNORMAL HIGH (ref 0.44–1.00)
GFR, Estimated: 53 mL/min — ABNORMAL LOW (ref >60)
Glucose, Bld: 86 mg/dL (ref 70–99)
Potassium: 4.5 mmol/L (ref 3.5–5.1)
Sodium: 138 mmol/L (ref 135–145)

## 2022-09-25 MED ORDER — IPRATROPIUM-ALBUTEROL 0.5-2.5 (3) MG/3ML IN SOLN: 3.0000 mL | RESPIRATORY_TRACT | Status: DC | PRN

## 2022-09-25 MED ORDER — HYDRALAZINE HCL 20 MG/ML IJ SOLN: 10.0000 mg | INTRAMUSCULAR | Status: DC | PRN

## 2022-09-25 MED ORDER — METOPROLOL TARTRATE 5 MG/5ML IV SOLN: 5.0000 mg | INTRAVENOUS | Status: DC | PRN

## 2022-09-25 NOTE — TOC Progression Note (Signed)
Transition of Care [facility]) - Progression Note    Patient Details  Name: TEARRA LABROSSE MRN: [id number] Date of Birth: 05-24-1965  Transition of Care [facility]) CM/SW Contact  Inis Sizer, LCSW Phone Number: 09/25/2022, 11:06 AM  Clinical Narrative:    CSW spoke with Tresa Endo of [facility] to request she review patient for possible placement in [place] in a locked memory care unit. CSW sent clinicals to [facility] for review.    Barriers to Discharge: Financial Resources, Inadequate or no insurance, Homeless with medical needs  Expected Discharge Plan and Services In-house Referral: Clinical Social Work   Post Acute Care Choice: Nursing Home Living arrangements for the past 2 months: Homeless [facility]) Expected Discharge Date: 12/24/21                                     Social Determinants of Health (SDOH) Interventions SDOH Screenings   Food Insecurity: No Food Insecurity (03/05/2022)  Housing: High Risk (03/21/2022)  Alcohol Screen: Low Risk  (09/06/2021)  Depression (PHQ2-9): Medium Risk (05/26/2020)  Tobacco Use: High Risk (03/21/2022)    Readmission Risk Interventions     No data to display

## 2022-09-25 NOTE — Progress Notes (Signed)
PROGRESS NOTE    Natalie Brown  ZOX:[id number] DOB: 04-26-1965 DOA: 12/24/2021 PCP: Lavinia Sharps, NP   Brief Narrative:   57 year old with a history of bipolar disorder and chronic dementia with advanced cerebral atrophy who was admitted to the acute units 12/24/2021 following a preceding admission at [facility] 09/06/2021-12/24/2021. She was initially admitted to [facility] with acute mania and aggressive behaviors. On 12/24/2021 she became nearly catatonic and was transferred to the ER and ultimately admitted. During her acute hospital stay she has been evaluated by Neurology and Psychiatry. EEG has revealed no evidence of seizure activity. Repeat brain imaging was unremarkable. It was ultimately determined that her AMS was due to due progression of her severe underlying dementia.   The patient has been deemed medically stable and is awaiting a disposition. The goal is to place her within the memory care unit but this is proving difficult as she is homeless and has no insurance. Per TOC patient's mother plans to go to DSS to apply for special assistance Medicaid.  No new events this week.   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  Seen by neurology and psychiatry.  Extensive workup has been negative.  At this point there is no expectation of improvement.  Currently on dysphagia 3 diet -Scheduled medication-Haldol 2 mg twice daily, Remeron 15 mg at bedtime -As needed medications-Atarax, Ativan, Zyprexa -Safety sitter     Bipolar  Disorder -Psych meds as above.   Vitamin B12 Deficiency:   Supplements ordered.    HTN:  well controlled BP parameters. IV prn   Tobacco Abuse Resolved.    Sinus Tachycardia: Resolved.   TTE reassuring.  TSH normal. Bradycardic, stopped metoprolol.      CKD Stage IIIa: Stable.   Constipation/altered GI function -Bowel regimen    Malnutrition Type:  Nutrition Problem: Altered GI function Etiology: constipation  Malnutrition Characteristics:  Signs/Symptoms: other (comment) (last BM 10 days ago)  Nutrition Interventions:  Interventions: Other (Comment) (scheduled bowel regimen per MD)  Estimated body mass index is 19.21 kg/m as calculated from the following:   Height as of this encounter: 5\' 6"  (1.676 m).   Weight as of this encounter: 54 kg.    DVT prophylaxis: Lovenox Code Status: Full Family Communication:   Status is: Inpatient Pending safe dispo. LLOS       Diet Orders (From admission, onward)     Start     Ordered   05/23/22 1257  DIET DYS 3 Room service appropriate? Yes; Fluid consistency: Thin  Diet effective now       Comments: Patient is not on isolation precautions  Question Answer Comment  Room service appropriate? Yes   Fluid consistency: Thin      05/23/22 1257            Subjective: Remains nonverbal.  Does not appear to be in acute distress.  Tracking me across the room   Examination:  General exam: Appears calm and comfortable, remains nonverbal Respiratory system: Clear to auscultation. Respiratory effort normal. Cardiovascular system: S1 & S2 heard, RRR.  No JVD, murmurs, rubs, gallops or clicks. No pedal edema. Gastrointestinal system: Abdomen is nondistended, soft and nontender. No organomegaly or masses felt. Normal bowel sounds heard. Central nervous system: Alert to name, follows basic commands Extremities: Symmetric 5 x 5 power. Skin: No rashes, lesions or ulcers Psychiatry: Poor judgment and  insight  Objective: Vitals:   09/23/22 2034 09/24/22 0609 09/24/22 0854 09/25/22 0641  BP: 112/67 124/61 115/71 121/74  Pulse: 68 76 80 97  Resp: 18 18 18 18   Temp: 97.6 F (36.4 C) 98.2 F (36.8 C) 98.3 F (36.8 C) 98.1 F (36.7 C)  TempSrc:    Oral  SpO2: 100% 100% 100%   Weight:      Height:       No intake or output data in the 24 hours ending 09/25/22 0750 Filed Weights   08/27/22 0500 09/03/22 0812 09/17/22 0812  Weight: 55.3 kg 54.5 kg 54 kg    Scheduled Meds:  vitamin B-12  500 mcg Oral Daily   enoxaparin (LOVENOX) injection  40 mg Subcutaneous Q24H   haloperidol  2 mg Oral BID   hydrocerin   Topical BID   mirtazapine  15 mg Oral QHS   polyethylene glycol  17 g Oral Daily   senna-docusate  2 tablet Oral QHS   Continuous Infusions:  sodium chloride      Nutritional status Signs/Symptoms: other (comment) (last BM 10 days ago) Interventions: Other (Comment) (scheduled bowel regimen per MD) Body mass index is 19.21 kg/m.  Data Reviewed:   CBC: No results for input(s): "WBC", "NEUTROABS", "HGB", "HCT", "MCV", "PLT" in the last 168 hours. Basic Metabolic Panel: No results for input(s): "NA", "K", "CL", "CO2", "GLUCOSE", "BUN", "CREATININE", "CALCIUM", "MG", "PHOS" in the last 168 hours. GFR: Estimated Creatinine Clearance: 50.2 mL/min (A) (by C-G formula based on SCr of 1.08 mg/dL (H)). Liver Function Tests: No results for input(s): "AST", "ALT", "ALKPHOS", "BILITOT", "PROT", "ALBUMIN" in the last 168 hours. No results for input(s): "LIPASE", "AMYLASE" in the last 168 hours. No results for input(s): "AMMONIA" in the last 168 hours. Coagulation Profile: No results for input(s): "INR", "PROTIME" in the last 168 hours. Cardiac Enzymes: No results for input(s): "CKTOTAL", "CKMB", "CKMBINDEX", "TROPONINI" in the last 168 hours. BNP (last 3 results) No results for input(s): "PROBNP" in the last 8760 hours. HbA1C: No results for input(s): "HGBA1C" in the last  72 hours. CBG: Recent Labs  Lab 09/23/22 2355  GLUCAP 96   Lipid Profile: No results for input(s): "CHOL", "HDL", "LDLCALC", "TRIG", "CHOLHDL", "LDLDIRECT" in the last 72 hours. Thyroid Function Tests: No results for input(s): "TSH", "T4TOTAL", "FREET4", "T3FREE", "THYROIDAB" in the last 72 hours. Anemia Panel: No results for input(s): "VITAMINB12", "FOLATE", "FERRITIN", "TIBC", "IRON", "RETICCTPCT" in the last 72 hours. Sepsis Labs: No results for input(s): "PROCALCITON", "LATICACIDVEN" in the last 168 hours.  No results found for this or any previous visit (from the past 240 hour(s)).       Radiology Studies: No results found.         LOS: 273 days   Time spent= 35 mins    Natalie Gallus Joline Maxcy, MD [facility]  If 7PM-7AM, please contact night-coverage  09/25/2022, 7:50 AM

## 2022-09-25 NOTE — Progress Notes (Signed)
Name: Natalie Brown. Drotar DOB: 1965/09/02  Please be advised that the above-named patient has a primary diagnosis of dementia which supersedes any psychiatric diagnosis.  Edwin Dada, MSW, LCSW Transitions of Care  Clinical Social Worker II [phone]

## 2022-09-26 MED ORDER — ADULT MULTIVITAMIN W/MINERALS CH
1.0000 | ORAL_TABLET | Freq: Every day | ORAL | Status: DC
  Administered 2022-09-26 – 2023-01-22 (×111): 1 via ORAL
  Filled 2022-09-26 (×115): qty 1

## 2022-09-26 NOTE — Progress Notes (Signed)
Nutrition Follow-up  DOCUMENTATION CODES:   Not applicable  INTERVENTION:  - Continue Dys 3 diet.   - Add MVI q day.   NUTRITION DIAGNOSIS:   Altered GI function related to constipation as evidenced by other (comment) (last BM 10 days ago). - Resolved.   GOAL:   Other (Comment) (more frequent bowel movements) - Met.   MONITOR:   I & O's  REASON FOR ASSESSMENT:   LOS    ASSESSMENT:   57 y.o. female admits related to AMS. PMH includes: Bipolar disorde rand HTN. Pt is currently receiving medical management related to early onset dementia with behavioral disturbances.  Meds reviewed: Vit B12, remeron, miralax, senokot. Labs reviewed:  Creatinine elevated.   The pt continues with good PO intakes. PO intakes range from 20-100%. Pt is still pending placement. RD will add MVI for now. Will continue to monitor PO intakes.   Diet Order:   Diet Order             DIET DYS 3 Room service appropriate? Yes; Fluid consistency: Thin  Diet effective now                   EDUCATION NEEDS:   Not appropriate for education at this time  Skin:  Skin Assessment: Reviewed RN Assessment  Last BM:  6/5 - type 6  Height:   Ht Readings from Last 1 Encounters:  02/03/22 5\' 6"  (1.676 m)    Weight:   Wt Readings from Last 1 Encounters:  09/17/22 54 kg    Ideal Body Weight:     BMI:  Body mass index is 19.21 kg/m.  Estimated Nutritional Needs:   Kcal:  1800-2160 kcals  Protein:  90-105 gm  Fluid:  >/= 1.8 L  Bethann Humble, RD, LDN, CNSC.

## 2022-09-26 NOTE — Progress Notes (Signed)
PROGRESS NOTE    Natalie Brown  HYQ:[id number] DOB: 08-May-1965 DOA: 12/24/2021 PCP: Lavinia Sharps, NP   Brief Narrative:   57 year old with a history of bipolar disorder and chronic dementia with advanced cerebral atrophy who was admitted to the acute units 12/24/2021 following a preceding admission at [facility] 09/06/2021-12/24/2021. She was initially admitted to [facility] with acute mania and aggressive behaviors. On 12/24/2021 she became nearly catatonic and was transferred to the ER and ultimately admitted. During her acute hospital stay she has been evaluated by Neurology and Psychiatry. EEG has revealed no evidence of seizure activity. Repeat brain imaging was unremarkable. It was ultimately determined that her AMS was due to due progression of her severe underlying dementia.   The patient has been deemed medically stable and is awaiting a disposition. The goal is to place her within the memory care unit but this is proving difficult as she is homeless and has no insurance. Per TOC patient's mother plans to go to DSS to apply for special assistance Medicaid.  No new events this week.   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  Seen by neurology and psychiatry.  Extensive workup has been negative.  At this point there is no expectation of improvement.  Currently on dysphagia 3 diet -Scheduled medication-Haldol 2 mg twice daily, Remeron 15 mg at bedtime -As needed medications-Atarax, Ativan, Zyprexa -Safety sitter in place.       Bipolar Disorder -Psych meds as above.   Vitamin B12 Deficiency:   Supplements ordered.    HTN:  well controlled BP parameters. IV prn   Tobacco Abuse Resolved.    Sinus Tachycardia: Resolved.   TTE reassuring.  TSH normal. Bradycardic, stopped metoprolol.      CKD Stage IIIa: Stable.   Constipation/altered GI function -Bowel regimen    Malnutrition Type:  Nutrition Problem: Altered GI function Etiology: constipation  Malnutrition Characteristics:  Signs/Symptoms: other (comment) (last BM 10 days ago)  Nutrition Interventions:  Interventions: Other (Comment) (scheduled bowel regimen per MD)  Estimated body mass index is 19.21 kg/m as calculated from the following:   Height as of this encounter: 5\' 6"  (1.676 m).   Weight as of this encounter: 54 kg.    DVT prophylaxis: Lovenox Code Status: Full Family Communication:   Status is: Inpatient Pending safe dispo. LLOS       Diet Orders (From admission, onward)     Start     Ordered   05/23/22 1257  DIET DYS 3 Room service appropriate? Yes; Fluid consistency: Thin  Diet effective now       Comments: Patient is not on isolation precautions  Question Answer Comment  Room service appropriate? Yes   Fluid consistency: Thin      05/23/22 1257            Subjective: Sitting up in the bed.  Coloring in her book.  Does not participate in any conversation  Examination:  General exam: Appears calm and comfortable, remains nonverbal Respiratory system: Clear to auscultation. Respiratory effort normal. Cardiovascular system: S1 & S2  heard, RRR. No JVD, murmurs, rubs, gallops or clicks. No pedal edema. Gastrointestinal system: Abdomen is nondistended, soft and nontender. No organomegaly or masses felt. Normal bowel sounds heard. Central nervous system: Alert to name, follows basic commands Extremities: Symmetric 5 x 5 power. Skin: No rashes, lesions or ulcers Psychiatry: Poor judgment and  insight  Objective: Vitals:   09/25/22 0804 09/25/22 1703 09/25/22 2112 09/26/22 0549  BP: 111/73 (!) 150/83 112/63 108/66  Pulse: (!) 113 (!) 132 (!) 115 83  Resp: 15 18 18 16   Temp: 98 F (36.7 C) 97.7 F (36.5 C) 98.6 F (37 C) 98.6 F (37 C)  TempSrc: Oral Oral Oral Oral  SpO2: 100% 100% 100%   Weight:      Height:        Intake/Output Summary (Last 24 hours) at 09/26/2022 0734 Last data filed at 09/25/2022 0945 Gross per 24 hour  Intake 237 ml  Output --  Net 237 ml   Filed Weights   08/27/22 0500 09/03/22 0812 09/17/22 0812  Weight: 55.3 kg 54.5 kg 54 kg    Scheduled Meds:  vitamin B-12  500 mcg Oral Daily   enoxaparin (LOVENOX) injection  40 mg Subcutaneous Q24H   haloperidol  2 mg Oral BID   hydrocerin   Topical BID   mirtazapine  15 mg Oral QHS   polyethylene glycol  17 g Oral Daily   senna-docusate  2 tablet Oral QHS   Continuous Infusions:  sodium chloride      Nutritional status Signs/Symptoms: other (comment) (last BM 10 days ago) Interventions: Other (Comment) (scheduled bowel regimen per MD) Body mass index is 19.21 kg/m.  Data Reviewed:   CBC: No results for input(s): "WBC", "NEUTROABS", "HGB", "HCT", "MCV", "PLT" in the last 168 hours. Basic Metabolic Panel: Recent Labs  Lab 09/25/22 0815  NA 138  K 4.5  CL 99  CO2 24  GLUCOSE 86  BUN 13  CREATININE 1.21*  CALCIUM 9.5   GFR: Estimated Creatinine Clearance: 44.8 mL/min (A) (by C-G formula based on SCr of 1.21 mg/dL (H)). Liver Function Tests: No results for input(s): "AST", "ALT", "ALKPHOS", "BILITOT", "PROT", "ALBUMIN" in the last 168 hours. No results for input(s): "LIPASE", "AMYLASE" in the last 168 hours. No results for input(s): "AMMONIA" in the last 168 hours. Coagulation Profile: No results for input(s): "INR", "PROTIME" in the last 168 hours. Cardiac Enzymes: No results for input(s): "CKTOTAL", "CKMB", "CKMBINDEX", "TROPONINI" in the last 168 hours. BNP (last 3  results) No results for input(s): "PROBNP" in the last 8760 hours. HbA1C: No results for input(s): "HGBA1C" in the last 72 hours. CBG: Recent Labs  Lab 09/23/22 2355  GLUCAP 96   Lipid Profile: No results for input(s): "CHOL", "HDL", "LDLCALC", "TRIG", "CHOLHDL", "LDLDIRECT" in the last 72 hours. Thyroid Function Tests: No results for input(s): "TSH", "T4TOTAL", "FREET4", "T3FREE", "THYROIDAB" in the last 72 hours. Anemia Panel: No results for input(s): "VITAMINB12", "FOLATE", "FERRITIN", "TIBC", "IRON", "RETICCTPCT" in the last 72 hours. Sepsis Labs: No results for input(s): "PROCALCITON", "LATICACIDVEN" in the last 168 hours.  No results found for this or any previous visit (from the past 240 hour(s)).       Radiology Studies: No results found.         LOS: 274 days   Time spent= 35 mins    Moussa Wiegand Joline Maxcy, MD [facility]  If 7PM-7AM, please contact night-coverage  09/26/2022, 7:34 AM

## 2022-09-27 NOTE — Plan of Care (Signed)

## 2022-09-27 NOTE — TOC Progression Note (Addendum)
Transition of Care [facility]) - Progression Note    Patient Details  Name: Natalie Brown MRN: [id number] Date of Birth: 10/24/1965  Transition of [facility] CM/SW Contact  Inis Sizer, LCSW Phone Number: 09/27/2022, 7:54 AM  Clinical Narrative:    Patient's PASRR is now under level 2 review. A clinician from [facility] will come to the hospital to evaluate the patient before a PASRR is issued.  CSW spoke with Sarina Ill of [facility] who states the facility cannot accept the patient due to behaviors. CSW attempted to advocate for patient and explain the situation but was ultimately denied a bed offer.    Barriers to Discharge: Financial Resources, Inadequate or no insurance, Homeless with medical needs  Expected Discharge Plan and Services In-house Referral: Clinical Social Work   Post Acute Care Choice: Nursing Home Living arrangements for the past 2 months: Homeless [facility]) Expected Discharge Date: 12/24/21                                     Social Determinants of Health (SDOH) Interventions SDOH Screenings   Food Insecurity: No Food Insecurity (03/05/2022)  Housing: High Risk (03/21/2022)  Alcohol Screen: Low Risk  (09/06/2021)  Depression (PHQ2-9): Medium Risk (05/26/2020)  Tobacco Use: High Risk (03/21/2022)    Readmission Risk Interventions     No data to display

## 2022-09-27 NOTE — Progress Notes (Signed)
PROGRESS NOTE    Natalie Brown  BJY:[id number] DOB: May 03, 1965 DOA: 12/24/2021 PCP: Lavinia Sharps, NP   Brief Narrative:   57 year old with a history of bipolar disorder and chronic dementia with advanced cerebral atrophy who was admitted to the acute units 12/24/2021 following a preceding admission at [facility] 09/06/2021-12/24/2021. She was initially admitted to [facility] with acute mania and aggressive behaviors. On 12/24/2021 she became nearly catatonic and was transferred to the ER and ultimately admitted. During her acute hospital stay she has been evaluated by Neurology and Psychiatry. EEG has revealed no evidence of seizure activity. Repeat brain imaging was unremarkable. It was ultimately determined that her AMS was due to due progression of her severe underlying dementia.   The patient has been deemed medically stable and is awaiting a disposition. The goal is to place her within the memory care unit but this is proving difficult as she is homeless and has no insurance. Per TOC patient's mother plans to go to DSS to apply for special assistance Medicaid.  No new events this week.   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  Seen by neurology and psychiatry.  Extensive workup has been negative.  At this point there is no expectation of improvement.  Currently on dysphagia 3 diet -Scheduled medication-Haldol 2 mg twice daily, Remeron 15 mg at bedtime -As needed medications-Atarax, Ativan, Zyprexa -Safety sitter in place.       Bipolar Disorder -Psych meds as above.   Vitamin B12 Deficiency:   Supplements ordered.    HTN:  well controlled BP parameters. IV prn   Tobacco Abuse Resolved.    Sinus Tachycardia: Resolved.   TTE reassuring.  TSH normal. Bradycardic, stopped metoprolol.      CKD Stage IIIa: Stable.   Constipation/altered GI function -Bowel regimen    Malnutrition Type:  Nutrition Problem: Altered GI function Etiology: constipation  Malnutrition Characteristics:  Signs/Symptoms: other (comment) (last BM 10 days ago)  Nutrition Interventions:  Interventions: Other (Comment) (scheduled bowel regimen per MD)  Estimated body mass index is 19.21 kg/m as calculated from the following:   Height as of this encounter: 5\' 6"  (1.676 m).   Weight as of this encounter: 54 kg.    DVT prophylaxis: Lovenox Code Status: Full Family Communication:   Status is: Inpatient Pending safe dispo. LLOS       Diet Orders (From admission, onward)     Start     Ordered   05/23/22 1257  DIET DYS 3 Room service appropriate? Yes; Fluid consistency: Thin  Diet effective now       Comments: Patient is not on isolation precautions  Question Answer Comment  Room service appropriate? Yes   Fluid consistency: Thin      05/23/22 1257            Subjective: Laying in the bed, no interaction  Examination:  General exam: Appears calm and comfortable, remains nonverbal Respiratory system: Clear to auscultation. Respiratory effort normal. Cardiovascular system: S1 & S2 heard, RRR. No JVD, murmurs, rubs, gallops or clicks. No pedal  edema. Gastrointestinal system: Abdomen is nondistended, soft and nontender. No organomegaly or masses felt. Normal bowel sounds heard. Central nervous system: Alert to name, follows basic commands Extremities: Symmetric 5 x 5 power. Skin: No rashes, lesions or ulcers Psychiatry: Poor judgment and insight  Objective: Vitals:   09/25/22 2112 09/26/22 0549 09/26/22 0812  09/27/22 0546  BP: 112/63 108/66 103/72 104/66  Pulse: (!) 115 83 86 (!) 104  Resp: 18 16 18 18   Temp: 98.6 F (37 C) 98.6 F (37 C) 97.6 F (36.4 C) 98.2 F (36.8 C)  TempSrc: Oral Oral Oral   SpO2: 100%  (!) 73% 100%  Weight:      Height:        Intake/Output Summary (Last 24 hours) at 09/27/2022 0750 Last data filed at 09/26/2022 1805 Gross per 24 hour  Intake 240 ml  Output --  Net 240 ml   Filed Weights   08/27/22 0500 09/03/22 0812 09/17/22 0812  Weight: 55.3 kg 54.5 kg 54 kg    Scheduled Meds:  vitamin B-12  500 mcg Oral Daily   enoxaparin (LOVENOX) injection  40 mg Subcutaneous Q24H   haloperidol  2 mg Oral BID   hydrocerin   Topical BID   mirtazapine  15 mg Oral QHS   multivitamin with minerals  1 tablet Oral Daily   polyethylene glycol  17 g Oral Daily   senna-docusate  2 tablet Oral QHS   Continuous Infusions:  sodium chloride      Nutritional status Signs/Symptoms: other (comment) (last BM 10 days ago) Interventions: Other (Comment) (scheduled bowel regimen per MD) Body mass index is 19.21 kg/m.  Data Reviewed:   CBC: No results for input(s): "WBC", "NEUTROABS", "HGB", "HCT", "MCV", "PLT" in the last 168 hours. Basic Metabolic Panel: Recent Labs  Lab 09/25/22 0815  NA 138  K 4.5  CL 99  CO2 24  GLUCOSE 86  BUN 13  CREATININE 1.21*  CALCIUM 9.5   GFR: Estimated Creatinine Clearance: 44.8 mL/min (A) (by C-G formula based on SCr of 1.21 mg/dL (H)). Liver Function Tests: No results for input(s): "AST", "ALT", "ALKPHOS", "BILITOT", "PROT", "ALBUMIN" in the last 168 hours. No results for input(s): "LIPASE", "AMYLASE" in the last 168 hours. No results for input(s): "AMMONIA" in the last 168 hours. Coagulation Profile: No results for input(s): "INR", "PROTIME" in the last 168 hours. Cardiac Enzymes: No results for input(s): "CKTOTAL", "CKMB", "CKMBINDEX", "TROPONINI" in the last 168 hours. BNP (last 3 results) No results for input(s):  "PROBNP" in the last 8760 hours. HbA1C: No results for input(s): "HGBA1C" in the last 72 hours. CBG: Recent Labs  Lab 09/23/22 2355  GLUCAP 96   Lipid Profile: No results for input(s): "CHOL", "HDL", "LDLCALC", "TRIG", "CHOLHDL", "LDLDIRECT" in the last 72 hours. Thyroid Function Tests: No results for input(s): "TSH", "T4TOTAL", "FREET4", "T3FREE", "THYROIDAB" in the last 72 hours. Anemia Panel: No results for input(s): "VITAMINB12", "FOLATE", "FERRITIN", "TIBC", "IRON", "RETICCTPCT" in the last 72 hours. Sepsis Labs: No results for input(s): "PROCALCITON", "LATICACIDVEN" in the last 168 hours.  No results found for this or any previous visit (from the past 240 hour(s)).       Radiology Studies: No results found.         LOS: 275 days   Time spent= 35 mins    Litzi Binning Joline Maxcy, MD [facility]  If 7PM-7AM, please contact night-coverage  09/27/2022, 7:50 AM

## 2022-09-28 LAB — BASIC METABOLIC PANEL
Anion gap: 13 (ref 5–15)
BUN: 15 mg/dL (ref 6–20)
CO2: 25 mmol/L (ref 22–32)
Calcium: 9.6 mg/dL (ref 8.9–10.3)
Chloride: 103 mmol/L (ref 98–111)
Creatinine, Ser: 1.25 mg/dL — ABNORMAL HIGH (ref 0.44–1.00)
GFR, Estimated: 51 mL/min — ABNORMAL LOW (ref >60)
Glucose, Bld: 124 mg/dL — ABNORMAL HIGH (ref 70–99)
Potassium: 4.5 mmol/L (ref 3.5–5.1)
Sodium: 141 mmol/L (ref 135–145)

## 2022-09-28 NOTE — Progress Notes (Signed)
PROGRESS NOTE    Natalie Brown  ZOX:[id number] DOB: 06-29-1965 DOA: 12/24/2021 PCP: Lavinia Sharps, NP   Brief Narrative:   57 year old with a history of bipolar disorder and chronic dementia with advanced cerebral atrophy who was admitted to the acute units 12/24/2021 following a preceding admission at [facility] 09/06/2021-12/24/2021. She was initially admitted to [facility] with acute mania and aggressive behaviors. On 12/24/2021 she became nearly catatonic and was transferred to the ER and ultimately admitted. During her acute hospital stay she has been evaluated by Neurology and Psychiatry. EEG has revealed no evidence of seizure activity. Repeat brain imaging was unremarkable. It was ultimately determined that her AMS was due to due progression of her severe underlying dementia.   The patient has been deemed medically stable and is awaiting a disposition. The goal is to place her within the memory care unit but this is proving difficult as she is homeless and has no insurance. Per TOC patient's mother plans to go to DSS to apply for special assistance Medicaid.  No new events this week.   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  Seen by neurology and psychiatry.  Extensive workup has been negative.  At this point there is no expectation of improvement.  Currently on dysphagia 3 diet -Scheduled medication-Haldol 2 mg twice daily, Remeron 15 mg at bedtime -As needed medications-Atarax, Ativan, Zyprexa - At times require safety sitter      Bipolar Disorder -Psych meds as above.   Vitamin B12 Deficiency:   Supplements ordered.    HTN:  well controlled BP parameters. IV prn   Tobacco Abuse Resolved.    Sinus Tachycardia: Resolved.   TTE reassuring.  TSH normal. Bradycardic, stopped metoprolol.      CKD Stage IIIa: Stable.   Constipation/altered GI function -Bowel regimen    Malnutrition Type:  Nutrition Problem: Altered GI function Etiology: constipation  Malnutrition Characteristics:  Signs/Symptoms: other (comment) (last BM 10 days ago)  Nutrition Interventions:  Interventions: Other (Comment) (scheduled bowel regimen per MD)  Estimated body mass index is 19.21 kg/m as calculated from the following:   Height as of this encounter: 5\' 6"  (1.676 m).   Weight as of this encounter: 54 kg.    DVT prophylaxis: Lovenox Code Status: Full Family Communication:   Status is: Inpatient Pending safe dispo. LLOS       Diet Orders (From admission, onward)     Start     Ordered   05/23/22 1257  DIET DYS 3 Room service appropriate? Yes; Fluid consistency: Thin  Diet effective now       Comments: Patient is not on isolation precautions  Question Answer Comment  Room service appropriate? Yes   Fluid consistency: Thin      05/23/22 1257            Subjective: Eating her breakfast, no interaction.  Examination:  General exam: Appears calm and comfortable, remains nonverbal Respiratory system: Clear to auscultation. Respiratory effort normal. Cardiovascular system: S1 & S2 heard, RRR. No JVD, murmurs, rubs, gallops or clicks. No pedal  edema. Gastrointestinal system: Abdomen is nondistended, soft and nontender. No organomegaly or masses felt. Normal bowel sounds heard. Central nervous system: Alert to name, follows basic commands at times Extremities: Symmetric 5 x 5 power. Skin: No rashes, lesions or ulcers Psychiatry: Poor judgment and insight  Objective: Vitals:   09/27/22 0931 09/27/22  1732 09/27/22 2124 09/28/22 0625  BP: (!) 116/90 (!) 86/61 106/66 (!) 142/71  Pulse:  64 98 (!) 103  Resp: 16 18 19 19   Temp: 97.8 F (36.6 C)  98.6 F (37 C) 98.9 F (37.2 C)  TempSrc: Oral  Oral   SpO2:  100% 100% 100%  Weight:      Height:        Intake/Output Summary (Last 24 hours) at 09/28/2022 0710 Last data filed at 09/27/2022 0800 Gross per 24 hour  Intake 237 ml  Output --  Net 237 ml   Filed Weights   08/27/22 0500 09/03/22 0812 09/17/22 0812  Weight: 55.3 kg 54.5 kg 54 kg    Scheduled Meds:  vitamin B-12  500 mcg Oral Daily   enoxaparin (LOVENOX) injection  40 mg Subcutaneous Q24H   haloperidol  2 mg Oral BID   hydrocerin   Topical BID   mirtazapine  15 mg Oral QHS   multivitamin with minerals  1 tablet Oral Daily   polyethylene glycol  17 g Oral Daily   senna-docusate  2 tablet Oral QHS   Continuous Infusions:  sodium chloride      Nutritional status Signs/Symptoms: other (comment) (last BM 10 days ago) Interventions: Other (Comment) (scheduled bowel regimen per MD) Body mass index is 19.21 kg/m.  Data Reviewed:   CBC: No results for input(s): "WBC", "NEUTROABS", "HGB", "HCT", "MCV", "PLT" in the last 168 hours. Basic Metabolic Panel: Recent Labs  Lab 09/25/22 0815  NA 138  K 4.5  CL 99  CO2 24  GLUCOSE 86  BUN 13  CREATININE 1.21*  CALCIUM 9.5   GFR: Estimated Creatinine Clearance: 44.8 mL/min (A) (by C-G formula based on SCr of 1.21 mg/dL (H)). Liver Function Tests: No results for input(s): "AST", "ALT", "ALKPHOS", "BILITOT", "PROT", "ALBUMIN" in the last 168 hours. No results for input(s): "LIPASE", "AMYLASE" in the last 168 hours. No results for input(s): "AMMONIA" in the last 168 hours. Coagulation Profile: No results for input(s): "INR", "PROTIME" in the last 168 hours. Cardiac Enzymes: No results for input(s): "CKTOTAL", "CKMB", "CKMBINDEX", "TROPONINI" in the last 168 hours. BNP (last 3 results) No results for input(s):  "PROBNP" in the last 8760 hours. HbA1C: No results for input(s): "HGBA1C" in the last 72 hours. CBG: Recent Labs  Lab 09/23/22 2355  GLUCAP 96   Lipid Profile: No results for input(s): "CHOL", "HDL", "LDLCALC", "TRIG", "CHOLHDL", "LDLDIRECT" in the last 72 hours. Thyroid Function Tests: No results for input(s): "TSH", "T4TOTAL", "FREET4", "T3FREE", "THYROIDAB" in the last 72 hours. Anemia Panel: No results for input(s): "VITAMINB12", "FOLATE", "FERRITIN", "TIBC", "IRON", "RETICCTPCT" in the last 72 hours. Sepsis Labs: No results for input(s): "PROCALCITON", "LATICACIDVEN" in the last 168 hours.  No results found for this or any previous visit (from the past 240 hour(s)).       Radiology Studies: No results found.         LOS: 276 days   Time spent= 35 mins    Minnah Llamas Joline Maxcy, MD [facility]  If 7PM-7AM, please contact night-coverage  09/28/2022, 7:10 AM

## 2022-09-29 NOTE — Progress Notes (Signed)
PROGRESS NOTE    Natalie Brown  GYI:[id number] DOB: 1965/06/10 DOA: 12/24/2021 PCP: Lavinia Sharps, NP   Brief Narrative:   57 year old with a history of bipolar disorder and chronic dementia with advanced cerebral atrophy who was admitted to the acute units 12/24/2021 following a preceding admission at [facility] 09/06/2021-12/24/2021. She was initially admitted to [facility] with acute mania and aggressive behaviors. On 12/24/2021 she became nearly catatonic and was transferred to the ER and ultimately admitted. During her acute hospital stay she has been evaluated by Neurology and Psychiatry. EEG has revealed no evidence of seizure activity. Repeat brain imaging was unremarkable. It was ultimately determined that her AMS was due to due progression of her severe underlying dementia.   The patient has been deemed medically stable and is awaiting a disposition. The goal is to place her within the memory care unit but this is proving difficult as she is homeless and has no insurance. Per TOC patient's mother plans to go to DSS to apply for special assistance Medicaid.  No new events this week.   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  Seen by neurology and psychiatry.  Extensive workup has been negative.  At this point there is no expectation of improvement.  Currently on dysphagia 3 diet -Scheduled medication-Haldol 2 mg twice daily, Remeron 15 mg at bedtime -As needed medications-Atarax, Ativan, Zyprexa - At times require safety sitter      Bipolar Disorder -Psych meds as above.   Vitamin B12 Deficiency:   Supplements ordered.    HTN:  well controlled BP parameters. IV prn   Tobacco Abuse Resolved.    Sinus Tachycardia: Resolved.   TTE reassuring.  TSH normal. Bradycardic, stopped metoprolol.      CKD Stage IIIa: Stable.   Constipation/altered GI function -Bowel regimen    Malnutrition Type:  Nutrition Problem: Altered GI function Etiology: constipation  Malnutrition Characteristics:  Signs/Symptoms: other (comment) (last BM 10 days ago)  Nutrition Interventions:  Interventions: Other (Comment) (scheduled bowel regimen per MD)  Estimated body mass index is 19.21 kg/m as calculated from the following:   Height as of this encounter: 5\' 6"  (1.676 m).   Weight as of this encounter: 54 kg.    DVT prophylaxis: Lovenox Code Status: Full Family Communication:   Status is: Inpatient Pending safe dispo. LLOS       Diet Orders (From admission, onward)     Start     Ordered   05/23/22 1257  DIET DYS 3 Room service appropriate? Yes; Fluid consistency: Thin  Diet effective now       Comments: Patient is not on isolation precautions  Question Answer Comment  Room service appropriate? Yes   Fluid consistency: Thin      05/23/22 1257            Subjective: Seen at bedside.  Does not participate in any conversation.  Resting comfortably  Examination:  General exam: Appears calm and comfortable, remains nonverbal Respiratory system: Clear to auscultation. Respiratory effort normal. Cardiovascular system: S1 & S2 heard, RRR. No  JVD, murmurs, rubs, gallops or clicks. No pedal edema. Gastrointestinal system: Abdomen is nondistended, soft and nontender. No organomegaly or masses felt. Normal bowel sounds heard. Central nervous system: Alert to name, follows basic commands at times Extremities: Symmetric 5 x 5 power. Skin: No rashes, lesions or ulcers Psychiatry: Poor judgment and  insight  Objective: Vitals:   09/28/22 0625 09/28/22 0812 09/28/22 1715 09/29/22 0840  BP: (!) 142/71 138/73 (!) 156/99 (!) 102/53  Pulse: (!) 103 81 (!) 103 (!) 58  Resp: 19 18 18 15   Temp: 98.9 F (37.2 C) 99.1 F (37.3 C) 97.9 F (36.6 C) (!) 97.5 F (36.4 C)  TempSrc:    Oral  SpO2: 100% 100% 93%   Weight:      Height:        Intake/Output Summary (Last 24 hours) at 09/29/2022 0843 Last data filed at 09/28/2022 1300 Gross per 24 hour  Intake 480 ml  Output 1 ml  Net 479 ml   Filed Weights   08/27/22 0500 09/03/22 0812 09/17/22 0812  Weight: 55.3 kg 54.5 kg 54 kg    Scheduled Meds:  vitamin B-12  500 mcg Oral Daily   enoxaparin (LOVENOX) injection  40 mg Subcutaneous Q24H   haloperidol  2 mg Oral BID   hydrocerin   Topical BID   mirtazapine  15 mg Oral QHS   multivitamin with minerals  1 tablet Oral Daily   polyethylene glycol  17 g Oral Daily   senna-docusate  2 tablet Oral QHS   Continuous Infusions:  sodium chloride      Nutritional status Signs/Symptoms: other (comment) (last BM 10 days ago) Interventions: Other (Comment) (scheduled bowel regimen per MD) Body mass index is 19.21 kg/m.  Data Reviewed:   CBC: No results for input(s): "WBC", "NEUTROABS", "HGB", "HCT", "MCV", "PLT" in the last 168 hours. Basic Metabolic Panel: Recent Labs  Lab 09/25/22 0815 09/28/22 1145  NA 138 141  K 4.5 4.5  CL 99 103  CO2 24 25  GLUCOSE 86 124*  BUN 13 15  CREATININE 1.21* 1.25*  CALCIUM 9.5 9.6   GFR: Estimated Creatinine Clearance: 43.4 mL/min (A) (by C-G formula based on SCr of 1.25 mg/dL (H)). Liver Function Tests: No results for input(s): "AST", "ALT", "ALKPHOS", "BILITOT", "PROT", "ALBUMIN" in the last 168 hours. No results for input(s): "LIPASE", "AMYLASE" in the last 168 hours. No results for input(s): "AMMONIA" in the last 168 hours. Coagulation Profile: No results for input(s): "INR", "PROTIME" in the last 168 hours. Cardiac Enzymes: No  results for input(s): "CKTOTAL", "CKMB", "CKMBINDEX", "TROPONINI" in the last 168 hours. BNP (last 3 results) No results for input(s): "PROBNP" in the last 8760 hours. HbA1C: No results for input(s): "HGBA1C" in the last 72 hours. CBG: Recent Labs  Lab 09/23/22 2355  GLUCAP 96   Lipid Profile: No results for input(s): "CHOL", "HDL", "LDLCALC", "TRIG", "CHOLHDL", "LDLDIRECT" in the last 72 hours. Thyroid Function Tests: No results for input(s): "TSH", "T4TOTAL", "FREET4", "T3FREE", "THYROIDAB" in the last 72 hours. Anemia Panel: No results for input(s): "VITAMINB12", "FOLATE", "FERRITIN", "TIBC", "IRON", "RETICCTPCT" in the last 72 hours. Sepsis Labs: No results for input(s): "PROCALCITON", "LATICACIDVEN" in the last 168 hours.  No results found for this or any previous visit (from the past 240 hour(s)).       Radiology Studies: No results found.         LOS: 277 days   Time spent= 35 mins    Tayte Childers Joline Maxcy, MD [facility]  If 7PM-7AM, please contact night-coverage  09/29/2022, 8:43 AM

## 2022-09-30 NOTE — Progress Notes (Signed)
PROGRESS NOTE    Natalie Brown  OVF:[id number] DOB: 1965-12-31 DOA: 12/24/2021 PCP: Lavinia Sharps, NP   Brief Narrative:   57 year old with a history of bipolar disorder and chronic dementia with advanced cerebral atrophy who was admitted to the acute units 12/24/2021 following a preceding admission at [facility] 09/06/2021-12/24/2021. She was initially admitted to [facility] with acute mania and aggressive behaviors. On 12/24/2021 she became nearly catatonic and was transferred to the ER and ultimately admitted. During her acute hospital stay she has been evaluated by Neurology and Psychiatry. EEG has revealed no evidence of seizure activity. Repeat brain imaging was unremarkable. It was ultimately determined that her AMS was due to due progression of her severe underlying dementia.   The patient has been deemed medically stable and is awaiting a disposition. The goal is to place her within the memory care unit but this is proving difficult as she is homeless and has no insurance. Per TOC patient's mother plans to go to DSS to apply for special assistance Medicaid.  No new events this week.   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  Seen by neurology and psychiatry.  Extensive workup has been negative.  At this point there is no expectation of improvement.  D3 Diet.  -Scheduled medication-Haldol 2 mg twice daily, Remeron 15 mg at bedtime -As needed medications-Atarax, Ativan, Zyprexa - At times require safety sitter     Bipolar  Disorder -Psych meds as above.   Vitamin B12 Deficiency:   Supplements ordered.    HTN:  well controlled BP parameters. IV prn   Tobacco Abuse Resolved.    Sinus Tachycardia: Resolved.   TTE reassuring.  TSH normal. Bradycardic, stopped metoprolol.      CKD Stage IIIa: Stable.   Constipation/altered GI function -Bowel regimen    Malnutrition Type:  Nutrition Problem: Altered GI function Etiology: constipation  Malnutrition Characteristics:  Signs/Symptoms: other (comment) (last BM 10 days ago)  Nutrition Interventions:  Interventions: Other (Comment) (scheduled bowel regimen per MD)  Estimated body mass index is 19.21 kg/m as calculated from the following:   Height as of this encounter: 5\' 6"  (1.676 m).   Weight as of this encounter: 54 kg.    DVT prophylaxis: Lovenox Code Status: Full Family Communication:   Status is: Inpatient Pending safe dispo. LLOS       Diet Orders (From admission, onward)     Start     Ordered   05/23/22 1257  DIET DYS 3 Room service appropriate? Yes; Fluid consistency: Thin  Diet effective now       Comments: Patient is not on isolation precautions  Question Answer Comment  Room service appropriate? Yes   Fluid consistency: Thin      05/23/22 1257            Subjective: Remains verbally unresponsive.  Does not appear to be any acute distress Examination:  General exam: Appears calm and comfortable, remains nonverbal Respiratory system: Clear to auscultation. Respiratory effort normal. Cardiovascular system: S1 & S2 heard, RRR. No JVD, murmurs, rubs, gallops  or clicks. No pedal edema. Gastrointestinal system: Abdomen is nondistended, soft and nontender. No organomegaly or masses felt. Normal bowel sounds heard. Central nervous system: Alert to name, follows basic commands at times Extremities: Symmetric 5 x 5 power. Skin: No rashes, lesions or ulcers Psychiatry: Poor judgment and insight  Objective: Vitals:    09/28/22 1715 09/29/22 0840 09/29/22 2023 09/30/22 0500  BP: (!) 156/99 (!) 102/53 128/75   Pulse: (!) 103 (!) 58 85   Resp: 18 15 16    Temp: 97.9 F (36.6 C) (!) 97.5 F (36.4 C) 98.3 F (36.8 C)   TempSrc:  Oral    SpO2: 93%  98%   Weight:    51.5 kg  Height:        Intake/Output Summary (Last 24 hours) at 09/30/2022 0751 Last data filed at 09/29/2022 2023 Gross per 24 hour  Intake 240 ml  Output --  Net 240 ml   Filed Weights   09/03/22 0812 09/17/22 0812 09/30/22 0500  Weight: 54.5 kg 54 kg 51.5 kg    Scheduled Meds:  vitamin B-12  500 mcg Oral Daily   enoxaparin (LOVENOX) injection  40 mg Subcutaneous Q24H   haloperidol  2 mg Oral BID   hydrocerin   Topical BID   mirtazapine  15 mg Oral QHS   multivitamin with minerals  1 tablet Oral Daily   polyethylene glycol  17 g Oral Daily   senna-docusate  2 tablet Oral QHS   Continuous Infusions:  sodium chloride      Nutritional status Signs/Symptoms: other (comment) (last BM 10 days ago) Interventions: Other (Comment) (scheduled bowel regimen per MD) Body mass index is 18.33 kg/m.  Data Reviewed:   CBC: No results for input(s): "WBC", "NEUTROABS", "HGB", "HCT", "MCV", "PLT" in the last 168 hours. Basic Metabolic Panel: Recent Labs  Lab 09/25/22 0815 09/28/22 1145  NA 138 141  K 4.5 4.5  CL 99 103  CO2 24 25  GLUCOSE 86 124*  BUN 13 15  CREATININE 1.21* 1.25*  CALCIUM 9.5 9.6   GFR: Estimated Creatinine Clearance: 41.3 mL/min (A) (by C-G formula based on SCr of 1.25 mg/dL (H)). Liver Function Tests: No results for input(s): "AST", "ALT", "ALKPHOS", "BILITOT", "PROT", "ALBUMIN" in the last 168 hours. No results for input(s): "LIPASE", "AMYLASE" in the last 168 hours. No results for input(s): "AMMONIA" in the last 168 hours. Coagulation Profile: No results for input(s): "INR", "PROTIME" in the last 168 hours. Cardiac Enzymes: No results for input(s): "CKTOTAL", "CKMB", "CKMBINDEX", "TROPONINI" in the  last 168 hours. BNP (last 3 results) No results for input(s): "PROBNP" in the last 8760 hours. HbA1C: No results for input(s): "HGBA1C" in the last 72 hours. CBG: Recent Labs  Lab 09/23/22 2355  GLUCAP 96   Lipid Profile: No results for input(s): "CHOL", "HDL", "LDLCALC", "TRIG", "CHOLHDL", "LDLDIRECT" in the last 72 hours. Thyroid Function Tests: No results for input(s): "TSH", "T4TOTAL", "FREET4", "T3FREE", "THYROIDAB" in the last 72 hours. Anemia Panel: No results for input(s): "VITAMINB12", "FOLATE", "FERRITIN", "TIBC", "IRON", "RETICCTPCT" in the last 72 hours. Sepsis Labs: No results for input(s): "PROCALCITON", "LATICACIDVEN" in the last 168 hours.  No results found for this or any previous visit (from the past 240 hour(s)).       Radiology Studies: No results found.         LOS: 278 days   Time spent= 35 mins    Kienna Moncada Joline Maxcy, MD [facility]  If 7PM-7AM, please contact night-coverage  09/30/2022,  7:51 AM

## 2022-10-01 LAB — BASIC METABOLIC PANEL
Anion gap: 10 (ref 5–15)
BUN: 14 mg/dL (ref 6–20)
CO2: 24 mmol/L (ref 22–32)
Calcium: 8.8 mg/dL — ABNORMAL LOW (ref 8.9–10.3)
Chloride: 104 mmol/L (ref 98–111)
Creatinine, Ser: 1.06 mg/dL — ABNORMAL HIGH (ref 0.44–1.00)
GFR, Estimated: >60 mL/min (ref >60)
Glucose, Bld: 85 mg/dL (ref 70–99)
Potassium: 4.1 mmol/L (ref 3.5–5.1)
Sodium: 138 mmol/L (ref 135–145)

## 2022-10-01 NOTE — Plan of Care (Signed)

## 2022-10-01 NOTE — Progress Notes (Signed)
PROGRESS NOTE    Natalie Brown  ZOX:[id number] DOB: Jun 06, 1965 DOA: 12/24/2021 PCP: Lavinia Sharps, NP   Brief Narrative:   57 year old with a history of bipolar disorder and chronic dementia with advanced cerebral atrophy who was admitted to the acute units 12/24/2021 following a preceding admission at [facility] 09/06/2021-12/24/2021. She was initially admitted to [facility] with acute mania and aggressive behaviors. On 12/24/2021 she became nearly catatonic and was transferred to the ER and ultimately admitted. During her acute hospital stay she has been evaluated by Neurology and Psychiatry. EEG has revealed no evidence of seizure activity. Repeat brain imaging was unremarkable. It was ultimately determined that her AMS was due to due progression of her severe underlying dementia.   The patient has been deemed medically stable and is awaiting a disposition. The goal is to place her within the memory care unit but this is proving difficult as she is homeless and has no insurance. Per TOC patient's mother plans to go to DSS to apply for special assistance Medicaid.  No new events all week.   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  Seen by neurology and psychiatry.  Extensive workup has been negative.  At this point there is no expectation of improvement.  D3 Diet.  -Scheduled medication-Haldol 2 mg twice daily, Remeron 15 mg at bedtime -As needed medications-Atarax, Ativan, Zyprexa - Needs safety sitter at times.      Bipolar  Disorder -Psych meds as above.   Vitamin B12 Deficiency:   Supplements ordered.    HTN: acceptable range.  IV prn   Tobacco Abuse Resolved.    Sinus Tachycardia: Resolved.   TTE reassuring.  TSH normal. Bradycardic, stopped metoprolol.      CKD Stage IIIa: Stable.   Constipation/altered GI function -Bowel regimen    Malnutrition Type:  Nutrition Problem: Altered GI function Etiology: constipation  Malnutrition Characteristics:  Signs/Symptoms: other (comment) (last BM 10 days ago)  Nutrition Interventions:  Interventions: Other (Comment) (scheduled bowel regimen per MD)  Estimated body mass index is 19.21 kg/m as calculated from the following:   Height as of this encounter: 5\' 6"  (1.676 m).   Weight as of this encounter: 54 kg.    DVT prophylaxis: Lovenox Code Status: Full Family Communication:   Status is: Inpatient Pending safe dispo. LLOS       Diet Orders (From admission, onward)     Start     Ordered   05/23/22 1257  DIET DYS 3 Room service appropriate? Yes; Fluid consistency: Thin  Diet effective now       Comments: Patient is not on isolation precautions  Question Answer Comment  Room service appropriate? Yes   Fluid consistency: Thin      05/23/22 1257            Subjective: No complaints  Examination:  General exam: Appears calm and comfortable, remains nonverbal Respiratory system: Clear to auscultation. Respiratory effort normal. Cardiovascular system: S1 & S2 heard, RRR. No JVD, murmurs, rubs, gallops or clicks. No pedal edema. Gastrointestinal system: Abdomen is nondistended,  soft and nontender. No organomegaly or masses felt. Normal bowel sounds heard. Central nervous system: Alert to name, follows basic commands at times Extremities: Symmetric 5 x 5 power. Skin: No rashes, lesions or ulcers Psychiatry: Poor judgment and insight  Objective: Vitals:   09/29/22 2023 09/30/22 0500 09/30/22 1921 10/01/22 0444  BP: 128/75   134/72 130/87  Pulse: 85  90 99  Resp: 16  16 16   Temp: 98.3 F (36.8 C)  97.9 F (36.6 C) 99 F (37.2 C)  TempSrc:   Oral Oral  SpO2: 98%  98% 100%  Weight:  51.5 kg    Height:        Intake/Output Summary (Last 24 hours) at 10/01/2022 0801 Last data filed at 09/30/2022 1536 Gross per 24 hour  Intake 267 ml  Output --  Net 267 ml   Filed Weights   09/03/22 0812 09/17/22 0812 09/30/22 0500  Weight: 54.5 kg 54 kg 51.5 kg    Scheduled Meds:  vitamin B-12  500 mcg Oral Daily   enoxaparin (LOVENOX) injection  40 mg Subcutaneous Q24H   haloperidol  2 mg Oral BID   hydrocerin   Topical BID   mirtazapine  15 mg Oral QHS   multivitamin with minerals  1 tablet Oral Daily   polyethylene glycol  17 g Oral Daily   senna-docusate  2 tablet Oral QHS   Continuous Infusions:  sodium chloride      Nutritional status Signs/Symptoms: other (comment) (last BM 10 days ago) Interventions: Other (Comment) (scheduled bowel regimen per MD) Body mass index is 18.33 kg/m.  Data Reviewed:   CBC: No results for input(s): "WBC", "NEUTROABS", "HGB", "HCT", "MCV", "PLT" in the last 168 hours. Basic Metabolic Panel: Recent Labs  Lab 09/25/22 0815 09/28/22 1145  NA 138 141  K 4.5 4.5  CL 99 103  CO2 24 25  GLUCOSE 86 124*  BUN 13 15  CREATININE 1.21* 1.25*  CALCIUM 9.5 9.6   GFR: Estimated Creatinine Clearance: 41.3 mL/min (A) (by C-G formula based on SCr of 1.25 mg/dL (H)). Liver Function Tests: No results for input(s): "AST", "ALT", "ALKPHOS", "BILITOT", "PROT", "ALBUMIN" in the last 168 hours. No results for input(s): "LIPASE", "AMYLASE" in the last 168 hours. No results for input(s): "AMMONIA" in the last 168 hours. Coagulation Profile: No results for input(s): "INR", "PROTIME" in the last 168 hours. Cardiac Enzymes: No results for input(s): "CKTOTAL", "CKMB", "CKMBINDEX", "TROPONINI" in the last 168 hours. BNP (last 3 results) No results for input(s): "PROBNP" in the last  8760 hours. HbA1C: No results for input(s): "HGBA1C" in the last 72 hours. CBG: No results for input(s): "GLUCAP" in the last 168 hours.  Lipid Profile: No results for input(s): "CHOL", "HDL", "LDLCALC", "TRIG", "CHOLHDL", "LDLDIRECT" in the last 72 hours. Thyroid Function Tests: No results for input(s): "TSH", "T4TOTAL", "FREET4", "T3FREE", "THYROIDAB" in the last 72 hours. Anemia Panel: No results for input(s): "VITAMINB12", "FOLATE", "FERRITIN", "TIBC", "IRON", "RETICCTPCT" in the last 72 hours. Sepsis Labs: No results for input(s): "PROCALCITON", "LATICACIDVEN" in the last 168 hours.  No results found for this or any previous visit (from the past 240 hour(s)).       Radiology Studies: No results found.         LOS: 279 days   Time spent= 35 mins    Aarav Burgett Joline Maxcy, MD [facility]  If 7PM-7AM, please contact night-coverage  10/01/2022, 8:01 AM

## 2022-10-02 NOTE — Plan of Care (Signed)

## 2022-10-02 NOTE — Progress Notes (Signed)
PROGRESS NOTE    Natalie Brown  ZOX:[id number] DOB: 25-Oct-1965 DOA: 12/24/2021 PCP: Lavinia Sharps, NP   Brief Narrative:   57 year old with a history of bipolar disorder and chronic dementia with advanced cerebral atrophy who was admitted to the acute units 12/24/2021 following a preceding admission at [facility] 09/06/2021-12/24/2021. She was initially admitted to [facility] with acute mania and aggressive behaviors. On 12/24/2021 she became nearly catatonic and was transferred to the ER and ultimately admitted. During her acute hospital stay she has been evaluated by Neurology and Psychiatry. EEG has revealed no evidence of seizure activity. Repeat brain imaging was unremarkable. It was ultimately determined that her AMS was due to due progression of her severe underlying dementia.   The patient has been deemed medically stable and is awaiting a disposition. The goal is to place her within the memory care unit but this is proving difficult as she is homeless and has no insurance. Per TOC patient's mother plans to go to DSS to apply for special assistance Medicaid.  No new events all week.   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  Seen by neurology and psychiatry.  Extensive workup has been negative.  At this point there is no expectation of improvement.  D3 Diet.  -Scheduled medication-Haldol 2 mg twice daily, Remeron 15 mg at bedtime -As needed medications-Atarax, Ativan, Zyprexa - Needs safety sitter at times.      Bipolar  Disorder -Psych meds as above.   Vitamin B12 Deficiency:   Supplements ordered.    HTN: acceptable range.  IV prn   Tobacco Abuse Resolved.    Sinus Tachycardia: Resolved.   TTE reassuring.  TSH normal. Bradycardic, stopped metoprolol.      CKD Stage IIIa: Stable.   Constipation/altered GI function -Bowel regimen    Malnutrition Type:  Nutrition Problem: Altered GI function Etiology: constipation  Malnutrition Characteristics:  Signs/Symptoms: other (comment) (last BM 10 days ago)  Nutrition Interventions:  Interventions: Other (Comment) (scheduled bowel regimen per MD)  Estimated body mass index is 19.21 kg/m as calculated from the following:   Height as of this encounter: 5\' 6"  (1.676 m).   Weight as of this encounter: 54 kg.    DVT prophylaxis: Lovenox Code Status: Full Family Communication:   Status is: Inpatient Pending safe dispo. LLOS       Diet Orders (From admission, onward)     Start     Ordered   05/23/22 1257  DIET DYS 3 Room service appropriate? Yes; Fluid consistency: Thin  Diet effective now       Comments: Patient is not on isolation precautions  Question Answer Comment  Room service appropriate? Yes   Fluid consistency: Thin      05/23/22 1257            Subjective: No complaints.  Eating her breakfast.  Examination:  General exam: Appears calm and comfortable, remains nonverbal Respiratory system: Clear to auscultation. Respiratory effort normal. Cardiovascular system: S1 & S2 heard, RRR. No JVD, murmurs, rubs, gallops or clicks. No pedal edema. Gastrointestinal  system: Abdomen is nondistended, soft and nontender. No organomegaly or masses felt. Normal bowel sounds heard. Central nervous system: Alert to name, follows basic commands at times Extremities: Symmetric 5 x 5 power. Skin: No rashes, lesions or ulcers Psychiatry: Poor judgment and insight  Objective: Vitals:   10/01/22 0943 10/01/22 1931 10/02/22 0635 10/02/22  0638  BP: (!) 147/117 (!) 90/52 120/64   Pulse: (!) 103 69 (!) 119 100  Resp: 18 16    Temp: 98.6 F (37 C)  98.8 F (37.1 C)   TempSrc:      SpO2: (!) 83% 97% 100%   Weight:      Height:       No intake or output data in the 24 hours ending 10/02/22 0755  Filed Weights   09/03/22 0812 09/17/22 0812 09/30/22 0500  Weight: 54.5 kg 54 kg 51.5 kg    Scheduled Meds:  vitamin B-12  500 mcg Oral Daily   enoxaparin (LOVENOX) injection  40 mg Subcutaneous Q24H   haloperidol  2 mg Oral BID   hydrocerin   Topical BID   mirtazapine  15 mg Oral QHS   multivitamin with minerals  1 tablet Oral Daily   polyethylene glycol  17 g Oral Daily   senna-docusate  2 tablet Oral QHS   Continuous Infusions:  sodium chloride      Nutritional status Signs/Symptoms: other (comment) (last BM 10 days ago) Interventions: Other (Comment) (scheduled bowel regimen per MD) Body mass index is 18.33 kg/m.  Data Reviewed:   CBC: No results for input(s): "WBC", "NEUTROABS", "HGB", "HCT", "MCV", "PLT" in the last 168 hours. Basic Metabolic Panel: Recent Labs  Lab 09/25/22 0815 09/28/22 1145 10/01/22 0754  NA 138 141 138  K 4.5 4.5 4.1  CL 99 103 104  CO2 24 25 24   GLUCOSE 86 124* 85  BUN 13 15 14   CREATININE 1.21* 1.25* 1.06*  CALCIUM 9.5 9.6 8.8*   GFR: Estimated Creatinine Clearance: 48.8 mL/min (A) (by C-G formula based on SCr of 1.06 mg/dL (H)). Liver Function Tests: No results for input(s): "AST", "ALT", "ALKPHOS", "BILITOT", "PROT", "ALBUMIN" in the last 168 hours. No results for input(s): "LIPASE", "AMYLASE" in the last 168 hours. No results for input(s): "AMMONIA" in the last 168 hours. Coagulation Profile: No results for input(s): "INR", "PROTIME" in the last 168 hours. Cardiac Enzymes: No results for input(s): "CKTOTAL", "CKMB", "CKMBINDEX", "TROPONINI" in the last 168 hours. BNP (last 3 results) No results for input(s): "PROBNP" in the last 8760 hours. HbA1C: No results for  input(s): "HGBA1C" in the last 72 hours. CBG: No results for input(s): "GLUCAP" in the last 168 hours.  Lipid Profile: No results for input(s): "CHOL", "HDL", "LDLCALC", "TRIG", "CHOLHDL", "LDLDIRECT" in the last 72 hours. Thyroid Function Tests: No results for input(s): "TSH", "T4TOTAL", "FREET4", "T3FREE", "THYROIDAB" in the last 72 hours. Anemia Panel: No results for input(s): "VITAMINB12", "FOLATE", "FERRITIN", "TIBC", "IRON", "RETICCTPCT" in the last 72 hours. Sepsis Labs: No results for input(s): "PROCALCITON", "LATICACIDVEN" in the last 168 hours.  No results found for this or any previous visit (from the past 240 hour(s)).       Radiology Studies: No results found.         LOS: 280 days   Time spent= 35 mins    Natalie Norkus Joline Maxcy, MD [facility]  If 7PM-7AM, please contact night-coverage  10/02/2022, 7:55 AM

## 2022-10-03 NOTE — Progress Notes (Signed)
PROGRESS NOTE    Natalie Brown  ZOX:[id number] DOB: 1965/12/31 DOA: 12/24/2021 PCP: Lavinia Sharps, NP   Brief Narrative:   57 year old with a history of bipolar disorder and chronic dementia with advanced cerebral atrophy who was admitted to the acute units 12/24/2021 following a preceding admission at [facility] 09/06/2021-12/24/2021. She was initially admitted to [facility] with acute mania and aggressive behaviors. On 12/24/2021 she became nearly catatonic and was transferred to the ER and ultimately admitted. During her acute hospital stay she has been evaluated by Neurology and Psychiatry. EEG has revealed no evidence of seizure activity. Repeat brain imaging was unremarkable. It was ultimately determined that her AMS was due to due progression of her severe underlying dementia.   The patient has been deemed medically stable and is awaiting a disposition. The goal is to place her within the memory care unit but this is proving difficult as she is homeless and has no insurance. Per TOC patient's mother plans to go to DSS to apply for special assistance Medicaid.  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  Seen by neurology and psychiatry.  Extensive workup has been negative.  At this point there is no expectation of improvement.  D3 Diet.  -Scheduled medication-Haldol 2 mg twice daily, Remeron 15 mg at bedtime -As needed medications-Atarax, Ativan, Zyprexa - Needs safety sitter at times.      Bipolar  Disorder -Psych meds as above.   Vitamin B12 Deficiency:   Supplements ordered.    HTN: acceptable range.  IV prn   Tobacco Abuse Resolved.    Sinus Tachycardia: Resolved.   TTE reassuring.  TSH normal. Bradycardic, stopped metoprolol.      CKD Stage IIIa: Stable.   Constipation/altered GI function -Bowel regimen    Malnutrition Type:  Nutrition Problem: Altered GI function Etiology: constipation  Malnutrition Characteristics:  Signs/Symptoms: other (comment) (last BM 10 days ago)  Nutrition Interventions:  Interventions: Other (Comment) (scheduled bowel regimen per MD)  Estimated body mass index is 19.21 kg/m as calculated from the following:   Height as of this encounter: 5\' 6"  (1.676 m).   Weight as of this encounter: 54 kg.    DVT prophylaxis: Lovenox Code Status: Full Family Communication:   Status is: Inpatient Pending safe dispo. LLOS       Diet Orders (From admission, onward)     Start     Ordered   05/23/22 1257  DIET DYS 3 Room service appropriate? Yes; Fluid consistency: Thin  Diet effective now       Comments: Patient is not on isolation precautions  Question Answer Comment  Room service appropriate? Yes   Fluid consistency: Thin      05/23/22 1257            Subjective: Eating breakfast, no complaints this morning  Examination:  General exam: Appears calm and comfortable, remains nonverbal Respiratory system: Clear to auscultation. Respiratory effort normal. Cardiovascular system: S1 & S2 heard, RRR. No JVD, murmurs, rubs, gallops or clicks. No pedal edema. Gastrointestinal system: Abdomen  is nondistended, soft and nontender. No organomegaly or masses felt. Normal bowel sounds heard. Central nervous system: Alert to name, follows basic commands at times Extremities: Symmetric 5 x 5 power. Skin: No rashes, lesions or ulcers Psychiatry: Poor judgment and insight  Objective: Vitals:   10/02/22 0638 10/02/22 2021 10/03/22 0430  10/03/22 0731  BP:  (!) 100/57 106/88 118/70  Pulse: 100 95 99 (!) 107  Resp:  18 16 18   Temp:  98.2 F (36.8 C) (!) 96.8 F (36 C) (!) 97.5 F (36.4 C)  TempSrc:   Axillary   SpO2:  100% 98% 100%  Weight:      Height:       No intake or output data in the 24 hours ending 10/03/22 1215  Filed Weights   09/03/22 0812 09/17/22 0812 09/30/22 0500  Weight: 54.5 kg 54 kg 51.5 kg    Scheduled Meds:  vitamin B-12  500 mcg Oral Daily   enoxaparin (LOVENOX) injection  40 mg Subcutaneous Q24H   haloperidol  2 mg Oral BID   hydrocerin   Topical BID   mirtazapine  15 mg Oral QHS   multivitamin with minerals  1 tablet Oral Daily   polyethylene glycol  17 g Oral Daily   senna-docusate  2 tablet Oral QHS   Continuous Infusions:  sodium chloride      Nutritional status Signs/Symptoms: other (comment) (last BM 10 days ago) Interventions: Other (Comment) (scheduled bowel regimen per MD) Body mass index is 18.33 kg/m.  Data Reviewed:   CBC: No results for input(s): "WBC", "NEUTROABS", "HGB", "HCT", "MCV", "PLT" in the last 168 hours. Basic Metabolic Panel: Recent Labs  Lab 09/28/22 1145 10/01/22 0754  NA 141 138  K 4.5 4.1  CL 103 104  CO2 25 24  GLUCOSE 124* 85  BUN 15 14  CREATININE 1.25* 1.06*  CALCIUM 9.6 8.8*   GFR: Estimated Creatinine Clearance: 48.8 mL/min (A) (by C-G formula based on SCr of 1.06 mg/dL (H)). Liver Function Tests: No results for input(s): "AST", "ALT", "ALKPHOS", "BILITOT", "PROT", "ALBUMIN" in the last 168 hours. No results for input(s): "LIPASE", "AMYLASE" in the last 168 hours. No results for input(s): "AMMONIA" in the last 168 hours. Coagulation Profile: No results for input(s): "INR", "PROTIME" in the last 168 hours. Cardiac Enzymes: No results for input(s): "CKTOTAL", "CKMB", "CKMBINDEX", "TROPONINI" in the last 168 hours. BNP (last 3 results) No results for input(s): "PROBNP" in the last 8760 hours. HbA1C: No results for input(s):  "HGBA1C" in the last 72 hours. CBG: No results for input(s): "GLUCAP" in the last 168 hours.  Lipid Profile: No results for input(s): "CHOL", "HDL", "LDLCALC", "TRIG", "CHOLHDL", "LDLDIRECT" in the last 72 hours. Thyroid Function Tests: No results for input(s): "TSH", "T4TOTAL", "FREET4", "T3FREE", "THYROIDAB" in the last 72 hours. Anemia Panel: No results for input(s): "VITAMINB12", "FOLATE", "FERRITIN", "TIBC", "IRON", "RETICCTPCT" in the last 72 hours. Sepsis Labs: No results for input(s): "PROCALCITON", "LATICACIDVEN" in the last 168 hours.  No results found for this or any previous visit (from the past 240 hour(s)).       Radiology Studies: No results found.         LOS: 281 days   Time spent= 35 mins    Patrick Sohm Joline Maxcy, MD [facility]  If 7PM-7AM, please contact night-coverage  10/03/2022, 12:15 PM

## 2022-10-04 NOTE — Plan of Care (Signed)
Patient alert but non-verbal, uts orientation.  All meds given on time as ordered.  Sitter remains at bedside.  POC maintained, will continue to monitor.  Problem: Education: Goal: Knowledge of General Education information will improve Description: Including pain rating scale, medication(s)/side effects and non-pharmacologic comfort measures Outcome: Progressing   Problem: Health Behavior/Discharge Planning: Goal: Ability to manage health-related needs will improve Outcome: Progressing   Problem: Clinical Measurements: Goal: Ability to maintain clinical measurements within normal limits will improve Outcome: Progressing Goal: Will remain free from infection Outcome: Progressing Goal: Diagnostic test results will improve Outcome: Progressing Goal: Respiratory complications will improve Outcome: Progressing Goal: Cardiovascular complication will be avoided Outcome: Progressing   Problem: Activity: Goal: Risk for activity intolerance will decrease Outcome: Progressing   Problem: Nutrition: Goal: Adequate nutrition will be maintained Outcome: Progressing   Problem: Coping: Goal: Level of anxiety will decrease Outcome: Progressing   Problem: Elimination: Goal: Will not experience complications related to bowel motility Outcome: Progressing Goal: Will not experience complications related to urinary retention Outcome: Progressing   Problem: Pain Managment: Goal: General experience of comfort will improve Outcome: Progressing   Problem: Safety: Goal: Ability to remain free from injury will improve Outcome: Progressing   Problem: Skin Integrity: Goal: Risk for impaired skin integrity will decrease Outcome: Progressing

## 2022-10-04 NOTE — Progress Notes (Signed)
PROGRESS NOTE    Natalie Brown  WGN:[id number] DOB: Sep 08, 1965 DOA: 12/24/2021 PCP: Lavinia Sharps, NP   Brief Narrative:   57 year old with a history of bipolar disorder and chronic dementia with advanced cerebral atrophy who was admitted to the acute units 12/24/2021 following a preceding admission at [facility] 09/06/2021-12/24/2021. She was initially admitted to [facility] with acute mania and aggressive behaviors. On 12/24/2021 she became nearly catatonic and was transferred to the ER and ultimately admitted. During her acute hospital stay she has been evaluated by Neurology and Psychiatry. EEG has revealed no evidence of seizure activity. Repeat brain imaging was unremarkable. It was ultimately determined that her AMS was due to due progression of her severe underlying dementia.   The patient has been deemed medically stable and is awaiting a disposition. The goal is to place her within the memory care unit but this is proving difficult as she is homeless and has no insurance. Per TOC patient's mother plans to go to DSS to apply for special assistance Medicaid.  No events all week. Pending safe dispo.   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  Seen by neurology and psychiatry.  Extensive workup has been negative.  At this point there is no expectation of improvement.  D3 Diet.  -Scheduled medication-Haldol 2 mg twice daily, Remeron 15 mg at bedtime -As needed medications-Atarax, Ativan, Zyprexa - Needs safety sitter at times.       Bipolar Disorder -Psych meds as above.   Vitamin B12 Deficiency:   Supplements ordered.    HTN: acceptable range.  IV prn   Tobacco Abuse Resolved.    Sinus Tachycardia: Resolved.   TTE reassuring.  TSH normal. Bradycardic, stopped metoprolol.      CKD Stage IIIa: Stable.   Constipation/altered GI function -Bowel regimen    Malnutrition Type:  Nutrition Problem: Altered GI function Etiology: constipation  Malnutrition Characteristics:  Signs/Symptoms: other (comment) (last BM 10 days ago)  Nutrition Interventions:  Interventions: Other (Comment) (scheduled bowel regimen per MD)  Estimated body mass index is 19.21 kg/m as calculated from the following:   Height as of this encounter: 5\' 6"  (1.676 m).   Weight as of this encounter: 54 kg.    DVT prophylaxis: Lovenox Code Status: Full Family Communication:   Status is: Inpatient Pending safe dispo. LLOS       Diet Orders (From admission, onward)     Start     Ordered   05/23/22 1257  DIET DYS 3 Room service appropriate? Yes; Fluid consistency: Thin  Diet effective now       Comments: Patient is not on isolation precautions  Question Answer Comment  Room service appropriate? Yes   Fluid consistency: Thin      05/23/22 1257            Subjective: Resting comfortably.  No complaints Sitter present at bedside  Examination:  Constitutional: Not in acute distress Respiratory: Clear to auscultation bilaterally Cardiovascular: Normal sinus rhythm, no rubs Abdomen: Nontender nondistended good bowel sounds Musculoskeletal: No edema noted Skin: No rashes  seen Neurologic: Grossly moving all extremities.  Psychiatric: Resting comfortably this morning therefore difficult to assess but overall has flat affect, Mostly nonverbal.    Objective: Vitals:   10/02/22 2021 10/03/22 0430 10/03/22 0731 10/04/22 0526  BP: (!) 100/57 106/88 118/70 (!) 100/56  Pulse: 95 99 (!) 107 61  Resp: 18 16 18    Temp: 98.2  F (36.8 C) (!) 96.8 F (36 C) (!) 97.5 F (36.4 C) 97.9 F (36.6 C)  TempSrc:  Axillary  Axillary  SpO2: 100% 98% 100% 100%  Weight:      Height:       No intake or output data in the 24 hours ending 10/04/22 0736  Filed Weights   09/03/22 0812 09/17/22 0812 09/30/22 0500  Weight: 54.5 kg 54 kg 51.5 kg    Scheduled Meds:  vitamin B-12  500 mcg Oral Daily   enoxaparin (LOVENOX) injection  40 mg Subcutaneous Q24H   haloperidol  2 mg Oral BID   hydrocerin   Topical BID   mirtazapine  15 mg Oral QHS   multivitamin with minerals  1 tablet Oral Daily   polyethylene glycol  17 g Oral Daily   senna-docusate  2 tablet Oral QHS   Continuous Infusions:  sodium chloride      Nutritional status Signs/Symptoms: other (comment) (last BM 10 days ago) Interventions: Other (Comment) (scheduled bowel regimen per MD) Body mass index is 18.33 kg/m.  Data Reviewed:   CBC: No results for input(s): "WBC", "NEUTROABS", "HGB", "HCT", "MCV", "PLT" in the last 168 hours. Basic Metabolic Panel: Recent Labs  Lab 09/28/22 1145 10/01/22 0754  NA 141 138  K 4.5 4.1  CL 103 104  CO2 25 24  GLUCOSE 124* 85  BUN 15 14  CREATININE 1.25* 1.06*  CALCIUM 9.6 8.8*   GFR: Estimated Creatinine Clearance: 48.8 mL/min (A) (by C-G formula based on SCr of 1.06 mg/dL (H)). Liver Function Tests: No results for input(s): "AST", "ALT", "ALKPHOS", "BILITOT", "PROT", "ALBUMIN" in the last 168 hours. No results for input(s): "LIPASE", "AMYLASE" in the last 168 hours. No results for input(s): "AMMONIA" in the last 168 hours. Coagulation Profile: No results for input(s): "INR", "PROTIME" in the last 168 hours. Cardiac Enzymes: No results for input(s): "CKTOTAL", "CKMB", "CKMBINDEX", "TROPONINI" in the last 168 hours. BNP (last 3 results) No results for input(s): "PROBNP" in the last 8760 hours. HbA1C: No results for input(s): "HGBA1C" in the last 72 hours. CBG: No results for input(s): "GLUCAP" in the  last 168 hours.  Lipid Profile: No results for input(s): "CHOL", "HDL", "LDLCALC", "TRIG", "CHOLHDL", "LDLDIRECT" in the last 72 hours. Thyroid Function Tests: No results for input(s): "TSH", "T4TOTAL", "FREET4", "T3FREE", "THYROIDAB" in the last 72 hours. Anemia Panel: No results for input(s): "VITAMINB12", "FOLATE", "FERRITIN", "TIBC", "IRON", "RETICCTPCT" in the last 72 hours. Sepsis Labs: No results for input(s): "PROCALCITON", "LATICACIDVEN" in the last 168 hours.  No results found for this or any previous visit (from the past 240 hour(s)).       Radiology Studies: No results found.         LOS: 282 days   Time spent= 35 mins    Natalie Gaut Joline Maxcy, MD [facility]  If 7PM-7AM, please contact night-coverage  10/04/2022, 7:36 AM

## 2022-10-05 DIAGNOSIS — R000 Tachycardia, unspecified: Secondary | ICD-10-CM

## 2022-10-05 NOTE — Plan of Care (Signed)
Patient alert but non-verbal, uta orientation. Pt slept through shift and refused night time meds. Sitter remains at bedside. POC maintained, will continue to monitor.  Problem: Education: Goal: Knowledge of General Education information will improve Description: Including pain rating scale, medication(s)/side effects and non-pharmacologic comfort measures Outcome: Progressing   Problem: Health Behavior/Discharge Planning: Goal: Ability to manage health-related needs will improve Outcome: Progressing   Problem: Clinical Measurements: Goal: Ability to maintain clinical measurements within normal limits will improve Outcome: Progressing Goal: Will remain free from infection Outcome: Progressing Goal: Diagnostic test results will improve Outcome: Progressing Goal: Respiratory complications will improve Outcome: Progressing Goal: Cardiovascular complication will be avoided Outcome: Progressing   Problem: Activity: Goal: Risk for activity intolerance will decrease Outcome: Progressing   Problem: Nutrition: Goal: Adequate nutrition will be maintained Outcome: Progressing   Problem: Coping: Goal: Level of anxiety will decrease Outcome: Progressing   Problem: Elimination: Goal: Will not experience complications related to bowel motility Outcome: Progressing Goal: Will not experience complications related to urinary retention Outcome: Progressing   Problem: Pain Managment: Goal: General experience of comfort will improve Outcome: Progressing   Problem: Safety: Goal: Ability to remain free from injury will improve Outcome: Progressing   Problem: Skin Integrity: Goal: Risk for impaired skin integrity will decrease Outcome: Progressing

## 2022-10-05 NOTE — Progress Notes (Signed)
[facility]  PROGRESS NOTE  Natalie Brown WUJ:[id number] DOB: 03/17/1966 DOA: 12/24/2021 PCP: Lavinia Sharps, NP   Brief HPI:   57 year old with a history of bipolar disorder and chronic dementia with advanced cerebral atrophy who was admitted to the acute units 12/24/2021 following a preceding admission at [facility] 09/06/2021-12/24/2021. She was initially admitted to [facility] with acute mania and aggressive behaviors. On 12/24/2021 she became nearly catatonic and was transferred to the ER and ultimately admitted. During her acute hospital stay she has been evaluated by Neurology and Psychiatry. EEG has revealed no evidence of seizure activity. Repeat brain imaging was unremarkable. It was ultimately determined that her AMS was due to due progression of her severe underlying dementia.   The patient has been deemed medically stable and is awaiting a disposition. The goal is to place her within the memory care unit but this is proving difficult as she is homeless and has no insurance. Per TOC patient's mother plans to go to DSS to apply for special assistance Medicaid.   Pending safe dispo.       Assessment/Plan:   Acute metabolic encephalopathy/chronic progressive early onset dementia -Noninteractive -Seen by neurology and psychiatry; extensive workup has been negative -No expectation of improvement -Dysphagia 3 diet -Continue Haldol, Remeron -Continue as needed Atarax, Ativan, Zyprexa  Bipolar disorder -Continue psych meds as above  B12 deficiency -Continue B12 supplementation  Hypertension -Continue hydralazine  as needed  Tobacco abuse -Resolved  Sinus tachycardia -Resolved -TTE reassuring -TSH normal -Metoprolol was stopped as patient became bradycardic  CKD stage III A -Stable  Constipation/altered GI function -Continue bowel regimen  Medications     vitamin B-12  500 mcg Oral Daily   enoxaparin (LOVENOX) injection  40 mg Subcutaneous Q24H   haloperidol   2 mg Oral BID   hydrocerin   Topical BID   mirtazapine  15 mg Oral QHS   multivitamin with minerals  1 tablet Oral Daily   polyethylene glycol  17 g Oral Daily   senna-docusate  2 tablet Oral QHS     Data Reviewed:   CBG:  No results for input(s): "GLUCAP" in the last 168 hours.  SpO2: 100 % O2 Flow Rate (L/min): 0 L/min    Vitals:   10/03/22 0430 10/03/22 0731 10/04/22 0526 10/05/22 0925  BP: 106/88 118/70 (!) 100/56 108/72  Pulse: 99 (!) 107 61 (!) 104  Resp: 16 18  18   Temp: (!) 96.8 F (36 C) (!) 97.5 F (36.4 C) 97.9 F (36.6 C) 97.6 F (36.4 C)  TempSrc: Axillary  Axillary Axillary  SpO2: 98% 100% 100% 100%  Weight:      Height:          Data Reviewed:  Basic Metabolic Panel: Recent Labs  Lab 09/28/22 1145 10/01/22 0754  NA 141 138  K 4.5 4.1  CL 103 104  CO2 25 24  GLUCOSE 124* 85  BUN 15 14  CREATININE 1.25* 1.06*  CALCIUM 9.6 8.8*    CBC: No results for input(s): "WBC", "NEUTROABS", "HGB", "HCT", "MCV", "PLT" in the last 168 hours.  LFT No results for input(s): "AST", "ALT", "ALKPHOS", "BILITOT", "PROT", "ALBUMIN" in the last 168 hours.   Antibiotics: Anti-infectives (From admission, onward)    None        DVT prophylaxis: Lovenox  Code Status: Full code  Family Communication:    CONSULTS    Subjective   Patient seen and examined, continues to be alert, nonverbal   Objective  Physical Examination:   General: Appears in no acute distress Cardiovascular: S1-S2, regular Respiratory: Lungs clear to auscultation bilaterally Abdomen: Abdomen is soft, nontender, no organomegaly Extremities: No edema in the lower extremities Neurologic: Alert, nonverbal   Status is: Inpatient:             Meredeth Ide   [facility] If 7PM-7AM, please contact night-coverage at [email or web address], Office  [phone]   10/05/2022, 10:48 AM  LOS: 283 days

## 2022-10-06 NOTE — Plan of Care (Signed)
Patient alert but non-verbal, uta orientation. Pt sat up to dinner last night then went to sleep later on. POC maintained, will continue to monitor.   Problem: Education: Goal: Knowledge of General Education information will improve Description: Including pain rating scale, medication(s)/side effects and non-pharmacologic comfort measures Outcome: Progressing   Problem: Health Behavior/Discharge Planning: Goal: Ability to manage health-related needs will improve Outcome: Progressing   Problem: Clinical Measurements: Goal: Ability to maintain clinical measurements within normal limits will improve Outcome: Progressing Goal: Will remain free from infection Outcome: Progressing Goal: Diagnostic test results will improve Outcome: Progressing Goal: Respiratory complications will improve Outcome: Progressing Goal: Cardiovascular complication will be avoided Outcome: Progressing   Problem: Activity: Goal: Risk for activity intolerance will decrease Outcome: Progressing   Problem: Nutrition: Goal: Adequate nutrition will be maintained Outcome: Progressing   Problem: Coping: Goal: Level of anxiety will decrease Outcome: Progressing   Problem: Elimination: Goal: Will not experience complications related to bowel motility Outcome: Progressing Goal: Will not experience complications related to urinary retention Outcome: Progressing   Problem: Pain Managment: Goal: General experience of comfort will improve Outcome: Progressing   Problem: Safety: Goal: Ability to remain free from injury will improve Outcome: Progressing   Problem: Skin Integrity: Goal: Risk for impaired skin integrity will decrease Outcome: Progressing

## 2022-10-06 NOTE — Progress Notes (Signed)
[facility]  PROGRESS NOTE  Natalie Brown:[id number] DOB: 04-17-1966 DOA: 12/24/2021 PCP: Lavinia Sharps, NP   Brief HPI:   57 year old with a history of bipolar disorder and chronic dementia with advanced cerebral atrophy who was admitted to the acute units 12/24/2021 following a preceding admission at [facility] 09/06/2021-12/24/2021. She was initially admitted to [facility] with acute mania and aggressive behaviors. On 12/24/2021 she became nearly catatonic and was transferred to the ER and ultimately admitted. During her acute hospital stay she has been evaluated by Neurology and Psychiatry. EEG has revealed no evidence of seizure activity. Repeat brain imaging was unremarkable. It was ultimately determined that her AMS was due to due progression of her severe underlying dementia.   The patient has been deemed medically stable and is awaiting a disposition. The goal is to place her within the memory care unit but this is proving difficult as she is homeless and has no insurance. Per TOC patient's mother plans to go to DSS to apply for special assistance Medicaid.   Pending safe dispo.       Assessment/Plan:   Acute metabolic encephalopathy/chronic progressive early onset dementia -Noninteractive -Seen by neurology and psychiatry; extensive workup has been negative -No expectation of improvement -Dysphagia 3 diet -Continue Haldol, Remeron -Continue as needed Atarax, Ativan, Zyprexa  Bipolar disorder -Continue psych meds as above  B12 deficiency -Continue B12 supplementation  Hypertension -Continue hydralazine  as needed  Tobacco abuse -Resolved  Sinus tachycardia -Resolved -TTE reassuring -TSH normal -Metoprolol was stopped as patient became bradycardic  CKD stage III A -Stable  Constipation/altered GI function -Continue bowel regimen  Medications     vitamin B-12  500 mcg Oral Daily   enoxaparin (LOVENOX) injection  40 mg Subcutaneous Q24H   haloperidol   2 mg Oral BID   hydrocerin   Topical BID   mirtazapine  15 mg Oral QHS   multivitamin with minerals  1 tablet Oral Daily   polyethylene glycol  17 g Oral Daily   senna-docusate  2 tablet Oral QHS     Data Reviewed:   CBG:  No results for input(s): "GLUCAP" in the last 168 hours.  SpO2: 100 % O2 Flow Rate (L/min): 0 L/min    Vitals:   10/04/22 0526 10/05/22 0925 10/05/22 1803 10/06/22 0700  BP: (!) 100/56 108/72 95/63 (!) 89/61  Pulse: 61 (!) 104 77 64  Resp:  18 18 15   Temp:  97.6 F (36.4 C) 98 F (36.7 C) 98.2 F (36.8 C)  TempSrc: Axillary Axillary Oral Oral  SpO2: 100% 100% 100% 100%  Weight:      Height:          Data Reviewed:  Basic Metabolic Panel: Recent Labs  Lab 10/01/22 0754  NA 138  K 4.1  CL 104  CO2 24  GLUCOSE 85  BUN 14  CREATININE 1.06*  CALCIUM 8.8*    CBC: No results for input(s): "WBC", "NEUTROABS", "HGB", "HCT", "MCV", "PLT" in the last 168 hours.  LFT No results for input(s): "AST", "ALT", "ALKPHOS", "BILITOT", "PROT", "ALBUMIN" in the last 168 hours.   Antibiotics: Anti-infectives (From admission, onward)    None        DVT prophylaxis: Lovenox  Code Status: Full code  Family Communication:    CONSULTS    Subjective   Somnolent but arousable.  Nonverbal   Objective    Physical Examination:   Somnolent but arousable, nonverbal S1-S2, regular Lungs clear to auscultation bilaterally Abdomen  is soft, nontender, no organomegaly   Status is: Inpatient:             Meredeth Ide   [facility] If 7PM-7AM, please contact night-coverage at [email or web address], Office  [phone]   10/06/2022, 7:47 AM  LOS: 284 days

## 2022-10-07 NOTE — Progress Notes (Signed)
PROGRESS NOTE    Natalie Brown  QIO:[id number] DOB: 02/22/1966 DOA: 12/24/2021 PCP: Lavinia Sharps, NP   Brief Narrative:   57 year old with a history of bipolar disorder and chronic dementia with advanced cerebral atrophy who was admitted to the acute units 12/24/2021 following a preceding admission at [facility] 09/06/2021-12/24/2021. She was initially admitted to [facility] with acute mania and aggressive behaviors. On 12/24/2021 she became nearly catatonic and was transferred to the ER and ultimately admitted. During her acute hospital stay she has been evaluated by Neurology and Psychiatry. EEG has revealed no evidence of seizure activity. Repeat brain imaging was unremarkable. It was ultimately determined that her AMS was due to due progression of her severe underlying dementia.   The patient has been deemed medically stable and is awaiting a disposition. The goal is to place her within the memory care unit but this is proving difficult as she is homeless and has no insurance. Per TOC patient's mother plans to go to DSS to apply for special assistance Medicaid.  No events all week. Pending safe dispo.   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  Seen by neurology and psychiatry.  Extensive workup has been negative.  At this point there is no expectation of improvement.  D3 Diet.  -Scheduled medication-Haldol 2 mg twice daily, Remeron 15 mg at bedtime -As needed medications-Atarax, Ativan, Zyprexa - Needs safety sitter at times.       Bipolar Disorder -Psych meds as above.   Vitamin B12 Deficiency:   Supplements ordered.    HTN: acceptable range.  IV prn   Tobacco Abuse Resolved.    Sinus Tachycardia: Resolved.   TTE reassuring.  TSH normal. Bradycardic, stopped metoprolol.      CKD Stage IIIa: Stable.   Constipation/altered GI function -Bowel regimen    Malnutrition Type:  Nutrition Problem: Altered GI function Etiology: constipation  Malnutrition Characteristics:  Signs/Symptoms: other (comment) (last BM 10 days ago)  Nutrition Interventions:  Interventions: Other (Comment) (scheduled bowel regimen per MD)  Estimated body mass index is 19.21 kg/m as calculated from the following:   Height as of this encounter: 5\' 6"  (1.676 m).   Weight as of this encounter: 54 kg.    DVT prophylaxis: Lovenox Code Status: Full Family Communication:   Status is: Inpatient Pending safe dispo. LLOS       Diet Orders (From admission, onward)     Start     Ordered   05/23/22 1257  DIET DYS 3 Room service appropriate? Yes; Fluid consistency: Thin  Diet effective now       Comments: Patient is not on isolation precautions  Question Answer Comment  Room service appropriate? Yes   Fluid consistency: Thin      05/23/22 1257            Subjective: Sitting up in the bed, attempting to count the lines on the bedsheet.  Does not appear to be in acute distress. Examination:  Constitutional: Not in acute distress Respiratory: Clear to auscultation bilaterally Cardiovascular: Normal sinus rhythm, no rubs Abdomen:  Nontender nondistended good bowel sounds Musculoskeletal: No edema noted Skin: No rashes seen Neurologic: Grossly moving all extremities.  Psychiatric: Poor judgment and insight.  Mostly remains nonverbal  Objective: Vitals:   10/05/22 0925 10/05/22 1803 10/06/22 0700 10/06/22 2020  BP: 108/72 95/63 (!) 89/61 96/75  Pulse: (!) 104 77 64 (!) 56  Resp: 18 18 15 15   Temp: 97.6 F (36.4 C)  98 F (36.7 C) 98.2 F (36.8 C) (!) 97.3 F (36.3 C)  TempSrc: Axillary Oral Oral Oral  SpO2: 100% 100% 100% 100%  Weight:      Height:       No intake or output data in the 24 hours ending 10/07/22 0750  Filed Weights   09/03/22 0812 09/17/22 0812 09/30/22 0500  Weight: 54.5 kg 54 kg 51.5 kg    Scheduled Meds:  vitamin B-12  500 mcg Oral Daily   enoxaparin (LOVENOX) injection  40 mg Subcutaneous Q24H   haloperidol  2 mg Oral BID   hydrocerin   Topical BID   mirtazapine  15 mg Oral QHS   multivitamin with minerals  1 tablet Oral Daily   polyethylene glycol  17 g Oral Daily   senna-docusate  2 tablet Oral QHS   Continuous Infusions:  sodium chloride      Nutritional status Signs/Symptoms: other (comment) (last BM 10 days ago) Interventions: Other (Comment) (scheduled bowel regimen per MD) Body mass index is 18.33 kg/m.  Data Reviewed:   CBC: No results for input(s): "WBC", "NEUTROABS", "HGB", "HCT", "MCV", "PLT" in the last 168 hours. Basic Metabolic Panel: Recent Labs  Lab 10/01/22 0754  NA 138  K 4.1  CL 104  CO2 24  GLUCOSE 85  BUN 14  CREATININE 1.06*  CALCIUM 8.8*   GFR: Estimated Creatinine Clearance: 48.8 mL/min (A) (by C-G formula based on SCr of 1.06 mg/dL (H)). Liver Function Tests: No results for input(s): "AST", "ALT", "ALKPHOS", "BILITOT", "PROT", "ALBUMIN" in the last 168 hours. No results for input(s): "LIPASE", "AMYLASE" in the last 168 hours. No results for input(s): "AMMONIA" in the last 168 hours. Coagulation Profile: No results for input(s): "INR", "PROTIME" in the last 168 hours. Cardiac Enzymes: No results for input(s): "CKTOTAL", "CKMB", "CKMBINDEX", "TROPONINI" in the last 168 hours. BNP (last 3 results) No results for input(s): "PROBNP" in the last 8760 hours. HbA1C: No results for input(s): "HGBA1C" in the last 72 hours. CBG: No results for input(s): "GLUCAP" in the last 168 hours.  Lipid Profile: No results for input(s):  "CHOL", "HDL", "LDLCALC", "TRIG", "CHOLHDL", "LDLDIRECT" in the last 72 hours. Thyroid Function Tests: No results for input(s): "TSH", "T4TOTAL", "FREET4", "T3FREE", "THYROIDAB" in the last 72 hours. Anemia Panel: No results for input(s): "VITAMINB12", "FOLATE", "FERRITIN", "TIBC", "IRON", "RETICCTPCT" in the last 72 hours. Sepsis Labs: No results for input(s): "PROCALCITON", "LATICACIDVEN" in the last 168 hours.  No results found for this or any previous visit (from the past 240 hour(s)).       Radiology Studies: No results found.         LOS: 285 days   Time spent= 35 mins    Alvah Lagrow Joline Maxcy, MD [facility]  If 7PM-7AM, please contact night-coverage  10/07/2022, 7:50 AM

## 2022-10-07 NOTE — TOC Progression Note (Addendum)
Transition of Care [facility]) - Progression Note    Patient Details  Name: Natalie Brown MRN: [id number] Date of Birth: 1965-07-13  Transition of Care [facility]) CM/SW Contact  Inis Sizer, LCSW Phone Number: 10/07/2022, 9:46 AM  Clinical Narrative:    Patient's PASRR is #[id number] - no expiration date.  CSW spoke with [place] in admissions at [facility] in [place] to discuss patient. Colin Mulders states she is agreeable to review referral. CSW faxed clinicals to [phone] for review.  Barriers to Discharge: Financial Resources, Inadequate or no insurance, Homeless with medical needs  Expected Discharge Plan and Services In-house Referral: Clinical Social Work   Post Acute Care Choice: Nursing Home Living arrangements for the past 2 months: Homeless [facility]) Expected Discharge Date: 12/24/21                                     Social Determinants of Health (SDOH) Interventions SDOH Screenings   Food Insecurity: No Food Insecurity (03/05/2022)  Housing: High Risk (03/21/2022)  Alcohol Screen: Low Risk  (09/06/2021)  Depression (PHQ2-9): Medium Risk (05/26/2020)  Tobacco Use: High Risk (03/21/2022)    Readmission Risk Interventions     No data to display

## 2022-10-07 NOTE — Plan of Care (Signed)
Patient alert but non-verbal, uta orientation. Pt sat up to dinner last night then went to sleep later on. Pt woke up this morning walking around, had to redirect pt.  Pt did oral care and flossed.  Pt had BM, was cleaned up.  POC maintained, will continue to monitor.   Problem: Education: Goal: Knowledge of General Education information will improve Description: Including pain rating scale, medication(s)/side effects and non-pharmacologic comfort measures Outcome: Progressing   Problem: Health Behavior/Discharge Planning: Goal: Ability to manage health-related needs will improve Outcome: Progressing   Problem: Clinical Measurements: Goal: Ability to maintain clinical measurements within normal limits will improve Outcome: Progressing Goal: Will remain free from infection Outcome: Progressing Goal: Diagnostic test results will improve Outcome: Progressing Goal: Respiratory complications will improve Outcome: Progressing Goal: Cardiovascular complication will be avoided Outcome: Progressing   Problem: Activity: Goal: Risk for activity intolerance will decrease Outcome: Progressing   Problem: Nutrition: Goal: Adequate nutrition will be maintained Outcome: Progressing   Problem: Coping: Goal: Level of anxiety will decrease Outcome: Progressing   Problem: Elimination: Goal: Will not experience complications related to bowel motility Outcome: Progressing Goal: Will not experience complications related to urinary retention Outcome: Progressing   Problem: Pain Managment: Goal: General experience of comfort will improve Outcome: Progressing   Problem: Safety: Goal: Ability to remain free from injury will improve Outcome: Progressing   Problem: Skin Integrity: Goal: Risk for impaired skin integrity will decrease Outcome: Progressing

## 2022-10-08 NOTE — Progress Notes (Signed)
PROGRESS NOTE    Natalie Brown  ZOX:[id number] DOB: 1965-09-28 DOA: 12/24/2021 PCP: Lavinia Sharps, NP   Brief Narrative:   57 year old with a history of bipolar disorder and chronic dementia with advanced cerebral atrophy who was admitted to the acute units 12/24/2021 following a preceding admission at [facility] 09/06/2021-12/24/2021. She was initially admitted to [facility] with acute mania and aggressive behaviors. On 12/24/2021 she became nearly catatonic and was transferred to the ER and ultimately admitted. During her acute hospital stay she has been evaluated by Neurology and Psychiatry. EEG has revealed no evidence of seizure activity. Repeat brain imaging was unremarkable. It was ultimately determined that her AMS was due to due progression of her severe underlying dementia.   The patient has been deemed medically stable and is awaiting a disposition. The goal is to place her within the memory care unit but this is proving difficult as she is homeless and has no insurance. Per TOC patient's mother plans to go to DSS to apply for special assistance Medicaid.  No events all week. TOC working on safe dispo.   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  Seen by neurology and psychiatry.  Extensive workup has been negative.  At this point there is no expectation of improvement.  D3 Diet.  -Scheduled medication-Haldol 2 mg twice daily, Remeron 15 mg at bedtime -As needed medications-Atarax, Ativan, Zyprexa - Needs safety sitter at times.       Bipolar Disorder -Psych meds as above.   Vitamin B12 Deficiency:   Supplements ordered.    HTN: acceptable range.  IV prn   Tobacco Abuse Resolved.    Sinus Tachycardia: Resolved.   TTE reassuring.  TSH normal. Bradycardic, stopped metoprolol.      CKD Stage IIIa: Stable.   Constipation/altered GI function -Bowel regimen    Malnutrition Type:  Nutrition Problem: Altered GI function Etiology: constipation  Malnutrition Characteristics:  Signs/Symptoms: other (comment) (last BM 10 days ago)  Nutrition Interventions:  Interventions: Other (Comment) (scheduled bowel regimen per MD)  Estimated body mass index is 19.21 kg/m as calculated from the following:   Height as of this encounter: 5\' 6"  (1.676 m).   Weight as of this encounter: 54 kg.    DVT prophylaxis: Lovenox Code Status: Full Family Communication:   Status is: Inpatient Pending safe dispo. LLOS       Diet Orders (From admission, onward)     Start     Ordered   05/23/22 1257  DIET DYS 3 Room service appropriate? Yes; Fluid consistency: Thin  Diet effective now       Comments: Patient is not on isolation precautions  Question Answer Comment  Room service appropriate? Yes   Fluid consistency: Thin      05/23/22 1257            Subjective: Laying in the bed, no complaints.  Remains nonverbal but does not appear to be in acute distress.  Examination:  Constitutional: Not in acute distress Respiratory: Clear to auscultation bilaterally Cardiovascular: Normal sinus rhythm, no rubs Abdomen: Nontender  nondistended good bowel sounds Musculoskeletal: No edema noted Skin: No rashes seen Neurologic: CN 2-12 grossly intact.  And nonfocal Psychiatric: Poor judgement, non verbal.    Objective: Vitals:   10/06/22 2020 10/07/22 0824 10/07/22 1632 10/07/22 2035  BP: 96/75 99/79 (!) 134/118 91/62  Pulse: (!) 56 69  74  Resp: 15 14 16 15   Temp: (!) 97.3 F (36.3 C) (!) 97.2 F (36.2 C)  97.7 F (36.5 C) 97.8 F (36.6 C)  TempSrc: Oral Axillary Oral Oral  SpO2: 100% 99%  100%  Weight:      Height:        Intake/Output Summary (Last 24 hours) at 10/08/2022 0807 Last data filed at 10/07/2022 2000 Gross per 24 hour  Intake 360 ml  Output --  Net 360 ml    Filed Weights   09/03/22 0812 09/17/22 0812 09/30/22 0500  Weight: 54.5 kg 54 kg 51.5 kg    Scheduled Meds:  vitamin B-12  500 mcg Oral Daily   enoxaparin (LOVENOX) injection  40 mg Subcutaneous Q24H   haloperidol  2 mg Oral BID   hydrocerin   Topical BID   mirtazapine  15 mg Oral QHS   multivitamin with minerals  1 tablet Oral Daily   polyethylene glycol  17 g Oral Daily   senna-docusate  2 tablet Oral QHS   Continuous Infusions:  sodium chloride      Nutritional status Signs/Symptoms: other (comment) (last BM 10 days ago) Interventions: Other (Comment) (scheduled bowel regimen per MD) Body mass index is 18.33 kg/m.  Data Reviewed:   CBC: No results for input(s): "WBC", "NEUTROABS", "HGB", "HCT", "MCV", "PLT" in the last 168 hours. Basic Metabolic Panel: No results for input(s): "NA", "K", "CL", "CO2", "GLUCOSE", "BUN", "CREATININE", "CALCIUM", "MG", "PHOS" in the last 168 hours.  GFR: Estimated Creatinine Clearance: 48.8 mL/min (A) (by C-G formula based on SCr of 1.06 mg/dL (H)). Liver Function Tests: No results for input(s): "AST", "ALT", "ALKPHOS", "BILITOT", "PROT", "ALBUMIN" in the last 168 hours. No results for input(s): "LIPASE", "AMYLASE" in the last 168 hours. No results for input(s): "AMMONIA" in the last 168 hours. Coagulation Profile: No results for input(s): "INR", "PROTIME" in the last 168 hours. Cardiac Enzymes: No results for input(s): "CKTOTAL", "CKMB", "CKMBINDEX", "TROPONINI" in the last 168 hours. BNP (last 3 results) No results for input(s): "PROBNP" in the last 8760 hours. HbA1C: No results for input(s): "HGBA1C" in the last 72 hours. CBG: No results for input(s):  "GLUCAP" in the last 168 hours.  Lipid Profile: No results for input(s): "CHOL", "HDL", "LDLCALC", "TRIG", "CHOLHDL", "LDLDIRECT" in the last 72 hours. Thyroid Function Tests: No results for input(s): "TSH", "T4TOTAL", "FREET4", "T3FREE", "THYROIDAB" in the last 72 hours. Anemia Panel: No results for input(s): "VITAMINB12", "FOLATE", "FERRITIN", "TIBC", "IRON", "RETICCTPCT" in the last 72 hours. Sepsis Labs: No results for input(s): "PROCALCITON", "LATICACIDVEN" in the last 168 hours.  No results found for this or any previous visit (from the past 240 hour(s)).       Radiology Studies: No results found.         LOS: 286 days   Time spent= 35 mins    Dantrell Schertzer Joline Maxcy, MD [facility]  If 7PM-7AM, please contact night-coverage  10/08/2022, 8:07 AM

## 2022-10-08 NOTE — TOC Progression Note (Signed)
Transition of Care [facility]) - Progression Note    Patient Details  Name: Natalie Brown MRN: [id number] Date of Birth: [birth year]  Transition of Care [facility]) CM/SW Contact  Inis Sizer, LCSW Phone Number: 10/08/2022, 10:21 AM  Clinical Narrative:    CSW spoke with Colin Mulders at [facility] to confirm referral was received - referral is currently under review at this time and Colin Mulders will notify CSW once a decision has been made.   Barriers to Discharge: Financial Resources, Inadequate or no insurance, Homeless with medical needs  Expected Discharge Plan and Services In-house Referral: Clinical Social Work   Post Acute Care Choice: Nursing Home Living arrangements for the past 2 months: Homeless [facility]) Expected Discharge Date: 12/24/21                     Social Determinants of Health (SDOH) Interventions SDOH Screenings   Food Insecurity: No Food Insecurity (03/05/2022)  Housing: High Risk (03/21/2022)  Alcohol Screen: Low Risk  (09/06/2021)  Depression (PHQ2-9): Medium Risk (05/26/2020)  Tobacco Use: High Risk (03/21/2022)    Readmission Risk Interventions     No data to display

## 2022-10-09 NOTE — TOC Progression Note (Signed)
Transition of Care [facility]) - Progression Note    Patient Details  Name: Natalie Brown MRN: [id number] Date of Birth: [birth year]  Transition of Care [facility]) CM/SW Contact  Inis Sizer, LCSW Phone Number: 10/09/2022, 11:22 AM  Clinical Narrative:    CSW spoke with Ronaldo Miyamoto of [facility] who states the facility is unable to offer the patient a bed due to her age and mental health diagnoses. CSW attempted to advocate for patient to explain that her dementia diagnosis is primary, however still was unable to obtain a bed offer.    Barriers to Discharge: Financial Resources, Inadequate or no insurance, Homeless with medical needs  Expected Discharge Plan and Services In-house Referral: Clinical Social Work   Post Acute Care Choice: Nursing Home Living arrangements for the past 2 months: Homeless [facility]) Expected Discharge Date: 12/24/21                     Social Determinants of Health (SDOH) Interventions SDOH Screenings   Food Insecurity: No Food Insecurity (03/05/2022)  Housing: High Risk (03/21/2022)  Alcohol Screen: Low Risk  (09/06/2021)  Depression (PHQ2-9): Medium Risk (05/26/2020)  Tobacco Use: High Risk (03/21/2022)    Readmission Risk Interventions     No data to display

## 2022-10-09 NOTE — Progress Notes (Signed)
PROGRESS NOTE    Natalie Brown  PIR:[id number] DOB: Jul 11, 1965 DOA: 12/24/2021 PCP: Lavinia Sharps, NP    Brief Narrative:  57 year old with a history of bipolar disorder and chronic dementia with advanced cerebral atrophy who was admitted to the acute units 12/24/2021 following a preceding admission at [facility] 09/06/2021-12/24/2021. She was initially admitted to [facility] with acute mania and aggressive behaviors. On 12/24/2021 she became nearly catatonic and was transferred to the ER and ultimately admitted. During her acute hospital stay she has been evaluated by Neurology and Psychiatry. EEG has revealed no evidence of seizure activity. Repeat brain imaging was unremarkable. It was ultimately determined that her AMS was due to due progression of her severe underlying dementia.   The patient has been deemed medically stable and is awaiting a disposition. The goal is to place her within the memory care unit but this is proving difficult as she is homeless and has no insurance. Per TOC patient's mother plans to go to DSS to apply for special assistance Medicaid.  No events all week. TOC working on safe dispo.    Assessment & Plan:  Acute Metabolic Encephalopathy on chronic progressive early onset Dementia: Nonverbal/noninteractive.   Seen by neurology and psychiatry.  Extensive workup has been negative.  At this point there is no expectation of improvement.   -Scheduled medication-Haldol 2 mg twice daily, Remeron 15 mg at bedtime -As needed medications-Atarax, Ativan, Zyprexa - Needs safety sitter at times.  Seems to be stable for the most part.   Bipolar Disorder -Psych meds as above.   Vitamin B12 Deficiency:   Supplements ordered.    Essential hypertension  Not on any scheduled medications at this time.    CKD Stage IIIa Stable.  Tobacco Abuse Resolved.    Sinus Tachycardia: Resolved.   TTE reassuring.  TSH normal. Bradycardic, stopped metoprolol.    Constipation/altered  GI function -Bowel regimen  DVT prophylaxis: Lovenox Code Status: Full Family Communication: No family at bedside. Disposition: To be determined.    Diet Orders (From admission, onward)     Start     Ordered   05/23/22 1257  DIET DYS 3 Room service appropriate? Yes; Fluid consistency: Thin  Diet effective now       Comments: Patient is not on isolation precautions  Question Answer Comment  Room service appropriate? Yes   Fluid consistency: Thin      05/23/22 1257            Subjective: Sitter in the room.  Patient lying on the bed.  Was eating her breakfast just a few minutes before I arrived.  Nonverbal.  This is her baseline.     Objective: Vitals:   10/08/22 1442 10/08/22 2200 10/09/22 0636 10/09/22 0918  BP:  (!) 92/56 (!) 145/81 122/87  Pulse:  62 (!) 110 93  Resp:  18 16 18   Temp:  (!) 97.5 F (36.4 C) 98.8 F (37.1 C) 98.3 F (36.8 C)  TempSrc:  Oral Oral   SpO2:  95% 100% 100%  Weight: 50.9 kg     Height:        Intake/Output Summary (Last 24 hours) at 10/09/2022 1034 Last data filed at 10/09/2022 1029 Gross per 24 hour  Intake 100 ml  Output --  Net 100 ml     Filed Weights   09/30/22 0500 10/08/22 0812 10/08/22 1442  Weight: 51.5 kg 51.6 kg 50.9 kg    Examination:  Not very cooperative with examination. Nonverbal.  S1-S2 is normal regular. Lungs are clear to auscultation bilaterally. No obvious focal neurological deficits.    Scheduled Meds:  vitamin B-12  500 mcg Oral Daily   enoxaparin (LOVENOX) injection  40 mg Subcutaneous Q24H   haloperidol  2 mg Oral BID   hydrocerin   Topical BID   mirtazapine  15 mg Oral QHS   multivitamin with minerals  1 tablet Oral Daily   polyethylene glycol  17 g Oral Daily   senna-docusate  2 tablet Oral QHS   Continuous Infusions:  sodium chloride       LOS: 287 days   Osvaldo Shipper, MD [facility]  If 7PM-7AM, please contact night-coverage  10/09/2022, 10:34 AM

## 2022-10-10 NOTE — Plan of Care (Signed)
  Problem: Clinical Measurements: Goal: Will remain free from infection Outcome: Progressing Goal: Diagnostic test results will improve Outcome: Progressing Goal: Respiratory complications will improve Outcome: Progressing Goal: Cardiovascular complication will be avoided Outcome: Progressing   Problem: Activity: Goal: Risk for activity intolerance will decrease Outcome: Progressing   Problem: Nutrition: Goal: Adequate nutrition will be maintained Outcome: Progressing   Problem: Elimination: Goal: Will not experience complications related to bowel motility Outcome: Progressing Goal: Will not experience complications related to urinary retention Outcome: Progressing   Problem: Pain Managment: Goal: General experience of comfort will improve Outcome: Progressing   Problem: Safety: Goal: Ability to remain free from injury will improve Outcome: Progressing   Problem: Skin Integrity: Goal: Risk for impaired skin integrity will decrease Outcome: Progressing   

## 2022-10-10 NOTE — TOC Progression Note (Signed)
Transition of Care [facility]) - Progression Note    Patient Details  Name: Natalie Brown MRN: [id number] Date of Birth: June 10, 1965  Transition of Care [facility]) CM/SW Contact  Inis Sizer, LCSW Phone Number: 10/10/2022, 10:14 AM  Clinical Narrative:    CSW spoke with [place] at [facility] who states the DON at [facility] facility denied patient admission into the facility.  CSW attempted to reach [place] at [facility] - no answer, so a voicemail was left requesting a return call.    Barriers to Discharge: Financial Resources, Inadequate or no insurance, Homeless with medical needs  Expected Discharge Plan and Services In-house Referral: Clinical Social Work   Post Acute Care Choice: Nursing Home Living arrangements for the past 2 months: Homeless [facility]) Expected Discharge Date: 12/24/21                   Social Determinants of Health (SDOH) Interventions SDOH Screenings   Food Insecurity: No Food Insecurity (03/05/2022)  Housing: High Risk (03/21/2022)  Alcohol Screen: Low Risk  (09/06/2021)  Depression (PHQ2-9): Medium Risk (05/26/2020)  Tobacco Use: High Risk (03/21/2022)    Readmission Risk Interventions     No data to display

## 2022-10-10 NOTE — Progress Notes (Signed)
PROGRESS NOTE    Natalie Brown  WUJ:[id number] DOB: April 02, 1966 DOA: 12/24/2021 PCP: Lavinia Sharps, NP    Brief Narrative:  57 year old with a history of bipolar disorder and chronic dementia with advanced cerebral atrophy who was admitted to the acute units 12/24/2021 following a preceding admission at [facility] 09/06/2021-12/24/2021. She was initially admitted to [facility] with acute mania and aggressive behaviors. On 12/24/2021 she became nearly catatonic and was transferred to the ER and ultimately admitted. During her acute hospital stay she has been evaluated by Neurology and Psychiatry. EEG has revealed no evidence of seizure activity. Repeat brain imaging was unremarkable. It was ultimately determined that her AMS was due to due progression of her severe underlying dementia.   The patient has been deemed medically stable and is awaiting a disposition. The goal is to place her within the memory care unit but this is proving difficult as she is homeless and has no insurance. Per TOC patient's mother plans to go to DSS to apply for special assistance Medicaid.  No events all week. TOC working on safe dispo.    Assessment & Plan:  Acute Metabolic Encephalopathy on chronic progressive early onset Dementia: Nonverbal/noninteractive.   Seen by neurology and psychiatry.  Extensive workup has been negative.  At this point there is no expectation of improvement.   Scheduled medication-Haldol 2 mg twice daily, Remeron 15 mg at bedtime As needed medications-Atarax, Ativan, Zyprexa Seems to be stable for the most part.   Bipolar Disorder Psych meds as above.   Vitamin B12 Deficiency:   Supplements ordered.    Essential hypertension  Not on any scheduled medications at this time.   Will order labs for tomorrow morning.  CKD Stage IIIa Stable.  Tobacco Abuse Resolved.    Sinus Tachycardia: Resolved.   TTE reassuring.  TSH normal. Bradycardic, stopped metoprolol.     Constipation/altered GI function -Bowel regimen  DVT prophylaxis: Lovenox Code Status: Full Family Communication: No family at bedside. Disposition: To be determined.    Diet Orders (From admission, onward)     Start     Ordered   05/23/22 1257  DIET DYS 3 Room service appropriate? Yes; Fluid consistency: Thin  Diet effective now       Comments: Patient is not on isolation precautions  Question Answer Comment  Room service appropriate? Yes   Fluid consistency: Thin      05/23/22 1257            Subjective: Sitter in the room.  Patient lying in the bed.  Did sit up to eat her breakfast.   Objective: Vitals:   10/09/22 0636 10/09/22 0918 10/09/22 1918 10/10/22 0515  BP: (!) 145/81 122/87 (!) 116/98 (!) 88/59  Pulse: (!) 110 93 98 66  Resp: 16 18  18   Temp: 98.8 F (37.1 C) 98.3 F (36.8 C) 98.6 F (37 C) 98 F (36.7 C)  TempSrc: Oral  Oral   SpO2: 100% 100% 100% 100%  Weight:      Height:        Intake/Output Summary (Last 24 hours) at 10/10/2022 0951 Last data filed at 10/09/2022 1029 Gross per 24 hour  Intake 100 ml  Output --  Net 100 ml     Filed Weights   09/30/22 0500 10/08/22 0812 10/08/22 1442  Weight: 51.5 kg 51.6 kg 50.9 kg    Examination:  No distress noted.    Scheduled Meds:  vitamin B-12  500 mcg Oral Daily  enoxaparin (LOVENOX) injection  40 mg Subcutaneous Q24H   haloperidol  2 mg Oral BID   hydrocerin   Topical BID   mirtazapine  15 mg Oral QHS   multivitamin with minerals  1 tablet Oral Daily   polyethylene glycol  17 g Oral Daily   senna-docusate  2 tablet Oral QHS   Continuous Infusions:  sodium chloride       LOS: 288 days   Osvaldo Shipper, MD [facility]  If 7PM-7AM, please contact night-coverage  10/10/2022, 9:51 AM

## 2022-10-11 ENCOUNTER — Inpatient Hospital Stay (HOSPITAL_COMMUNITY): Payer: MEDICAID

## 2022-10-11 LAB — BASIC METABOLIC PANEL
Anion gap: 7 (ref 5–15)
BUN: 20 mg/dL (ref 6–20)
CO2: 24 mmol/L (ref 22–32)
Calcium: 8.8 mg/dL — ABNORMAL LOW (ref 8.9–10.3)
Chloride: 110 mmol/L (ref 98–111)
Creatinine, Ser: 1.18 mg/dL — ABNORMAL HIGH (ref 0.44–1.00)
GFR, Estimated: 55 mL/min — ABNORMAL LOW (ref >60)
Glucose, Bld: 84 mg/dL (ref 70–99)
Potassium: 3.8 mmol/L (ref 3.5–5.1)
Sodium: 141 mmol/L (ref 135–145)

## 2022-10-11 LAB — CBC
HCT: 36.7 % (ref 36.0–46.0)
Hemoglobin: 12.3 g/dL (ref 12.0–15.0)
MCH: 28.7 pg (ref 26.0–34.0)
MCHC: 33.5 g/dL (ref 30.0–36.0)
MCV: 85.5 fL (ref 80.0–100.0)
Platelets: 244 10*3/uL (ref 150–400)
RBC: 4.29 MIL/uL (ref 3.87–5.11)
RDW: 14.1 % (ref 11.5–15.5)
WBC: 4.5 10*3/uL (ref 4.0–10.5)
nRBC: 0 % (ref 0.0–0.2)

## 2022-10-11 MED ORDER — LORAZEPAM 2 MG/ML IJ SOLN: 1.0000 mg | Freq: Once | INTRAMUSCULAR | Status: DC

## 2022-10-11 NOTE — Progress Notes (Signed)
Unwitnessed fall at 1100.    CNA entered patient's room and found the patient on the floor.  The patient had slipped out of bed between footboard and side rail to the floor.    2 CNAs assisted patient back to bed and informed nurse.  CNA stated patient was holding head when they got her up.    Nurse Secure Chatted MD who ordered CT scan.  CT scan negative.  VS wnl. No injuries noted.  Patient lying comfortably in bed.    Incident report completed.

## 2022-10-11 NOTE — Plan of Care (Signed)
  Problem: Clinical Measurements: Goal: Will remain free from infection Outcome: Progressing Goal: Diagnostic test results will improve Outcome: Progressing Goal: Respiratory complications will improve Outcome: Progressing Goal: Cardiovascular complication will be avoided Outcome: Progressing   Problem: Elimination: Goal: Will not experience complications related to urinary retention Outcome: Progressing   Problem: Pain Managment: Goal: General experience of comfort will improve Outcome: Progressing

## 2022-10-11 NOTE — Progress Notes (Addendum)
PROGRESS NOTE    Natalie Brown  NWG:[id number] DOB: Oct 05, 1965 DOA: 12/24/2021 PCP: Lavinia Sharps, NP    Brief Narrative:  57 year old with a history of bipolar disorder and chronic dementia with advanced cerebral atrophy who was admitted to the acute units 12/24/2021 following a preceding admission at [facility] 09/06/2021-12/24/2021. She was initially admitted to [facility] with acute mania and aggressive behaviors. On 12/24/2021 she became nearly catatonic and was transferred to the ER and ultimately admitted. During her acute hospital stay she has been evaluated by Neurology and Psychiatry. EEG has revealed no evidence of seizure activity. Repeat brain imaging was unremarkable. It was ultimately determined that her AMS was due to due progression of her severe underlying dementia.   The patient has been deemed medically stable and is awaiting a disposition. The goal is to place her within the memory care unit but this is proving difficult as she is homeless and has no insurance. Per TOC patient's mother plans to go to DSS to apply for special assistance Medicaid.  No events all week. TOC working on safe dispo.    Assessment & Plan:  Acute Metabolic Encephalopathy on chronic progressive early onset Dementia: Nonverbal/noninteractive.   Seen by neurology and psychiatry.   Extensive workup has been negative.  At this point there is no expectation of improvement.   Scheduled medication-Haldol 2 mg twice daily, Remeron 15 mg at bedtime As needed medications-Atarax, Ativan, Zyprexa Seems to be stable for the most part.   Bipolar Disorder Psych meds as above.   Vitamin B12 Deficiency:   Supplements ordered.    Essential hypertension  Not on any scheduled medications at this time.   Labs are pending  CKD Stage IIIa Stable.  Tobacco Abuse Resolved.    Sinus Tachycardia Noted occasionally.  Patient became bradycardic after she was started on metoprolol which was subsequently stopped.   Echocardiogram was unremarkable.  TSH was normal.   Constipation/altered GI function -Bowel regimen  DVT prophylaxis: Lovenox Code Status: Full Family Communication: No family at bedside. Disposition: To be determined.  THIS IS A NO CHARGE NOTE.  Diet Orders (From admission, onward)     Start     Ordered   05/23/22 1257  DIET DYS 3 Room service appropriate? Yes; Fluid consistency: Thin  Diet effective now       Comments: Patient is not on isolation precautions  Question Answer Comment  Room service appropriate? Yes   Fluid consistency: Thin      05/23/22 1257            Subjective: Lying on the bed.  Does not respond to questions as is her baseline.   Objective: Vitals:   10/10/22 0515 10/10/22 1953 10/11/22 0645 10/11/22 0921  BP: (!) 88/59 105/66 102/73 96/69  Pulse: 66 (!) 108 (!) 102 76  Resp: 18     Temp: 98 F (36.7 C) 98.7 F (37.1 C) 98.8 F (37.1 C)   TempSrc:  Oral    SpO2: 100% 100% 100%   Weight:      Height:        Intake/Output Summary (Last 24 hours) at 10/11/2022 0926 Last data filed at 10/10/2022 1300 Gross per 24 hour  Intake 70 ml  Output --  Net 70 ml     Filed Weights   09/30/22 0500 10/08/22 0812 10/08/22 1442  Weight: 51.5 kg 51.6 kg 50.9 kg    Examination:  Eyes closed.  Lying comfortably on the bed.  No distress  noted.    Scheduled Meds:  vitamin B-12  500 mcg Oral Daily   enoxaparin (LOVENOX) injection  40 mg Subcutaneous Q24H   haloperidol  2 mg Oral BID   hydrocerin   Topical BID   mirtazapine  15 mg Oral QHS   multivitamin with minerals  1 tablet Oral Daily   polyethylene glycol  17 g Oral Daily   senna-docusate  2 tablet Oral QHS   Continuous Infusions:  sodium chloride       LOS: 289 days   Osvaldo Shipper, MD [facility]  If 7PM-7AM, please contact night-coverage  10/11/2022, 9:26 AM

## 2022-10-12 NOTE — Progress Notes (Addendum)
PROGRESS NOTE    Natalie Brown  QIO:[id number] DOB: 1965-08-23 DOA: 12/24/2021 PCP: Lavinia Sharps, NP    Brief Narrative:  57 year old with a history of bipolar disorder and chronic dementia with advanced cerebral atrophy who was admitted to the acute units 12/24/2021 following a preceding admission at [facility] 09/06/2021-12/24/2021. She was initially admitted to [facility] with acute mania and aggressive behaviors. On 12/24/2021 she became nearly catatonic and was transferred to the ER and ultimately admitted. During her acute hospital stay she has been evaluated by Neurology and Psychiatry. EEG has revealed no evidence of seizure activity. Repeat brain imaging was unremarkable. It was ultimately determined that her AMS was due to due progression of her severe underlying dementia.   The patient has been deemed medically stable and is awaiting a disposition. The goal is to place her within the memory care unit but this is proving difficult as she is homeless and has no insurance. Per TOC patient's mother plans to go to DSS to apply for special assistance Medicaid.  No events all week. TOC working on safe dispo.    Assessment & Plan:  Acute Metabolic Encephalopathy on chronic progressive early onset Dementia: Nonverbal/noninteractive.   Seen by neurology and psychiatry.   Extensive workup has been negative.  At this point there is no expectation of improvement.   Scheduled medication-Haldol 2 mg twice daily, Remeron 15 mg at bedtime As needed medications-Atarax, Ativan, Zyprexa Patient had a unwitnessed fall yesterday.  She was apparently holding her head when nursing staff found her.  CT head was done which did not show any acute findings.  According to nursing staff she is back to her baseline.   Bipolar Disorder Psych meds as above.   Vitamin B12 Deficiency:   Supplements ordered.    Essential hypertension  Not on any scheduled medications at this time.   Labs from 6/22 shows  stability.  CKD Stage IIIa Stable.  Tobacco Abuse Resolved.    Sinus Tachycardia Noted occasionally.  Patient became bradycardic after she was started on metoprolol which was subsequently stopped.  Echocardiogram was unremarkable.  TSH was normal.   Constipation/altered GI function -Bowel regimen  DVT prophylaxis: Lovenox Code Status: Full Family Communication: No family at bedside. Disposition: To be determined.   Diet Orders (From admission, onward)     Start     Ordered   05/23/22 1257  DIET DYS 3 Room service appropriate? Yes; Fluid consistency: Thin  Diet effective now       Comments: Patient is not on isolation precautions  Question Answer Comment  Room service appropriate? Yes   Fluid consistency: Thin      05/23/22 1257            Subjective: Lying in the bed with a fork in her hand which is her usual baseline.  According to nursing staff she did have some breakfast this morning.  No other issues reported.   Objective: Vitals:   10/11/22 0645 10/11/22 0921 10/11/22 2057 10/12/22 0623  BP: 102/73 96/69 97/61  104/66  Pulse: (!) 102 76 67 (!) 113  Resp:   14   Temp: 98.8 F (37.1 C)  98.4 F (36.9 C) 99.2 F (37.3 C)  TempSrc:   Axillary   SpO2: 100%  100% 98%  Weight:      Height:        Intake/Output Summary (Last 24 hours) at 10/12/2022 1046 Last data filed at 10/12/2022 0830 Gross per 24 hour  Intake 540  ml  Output --  Net 540 ml     Filed Weights   09/30/22 0500 10/08/22 0812 10/08/22 1442  Weight: 51.5 kg 51.6 kg 50.9 kg    Examination:  General appearance: Lying on the bed with eyes closed. Resp: Clear to auscultation bilaterally.  Normal effort Cardio: S1-S2 is normal regular.  No S3-S4.  No rubs murmurs or bruit GI: Abdomen is soft.  Nontender nondistended.  Bowel sounds are present normal.  No masses organomegaly Extremities: No edema.       Scheduled Meds:  vitamin B-12  500 mcg Oral Daily   enoxaparin (LOVENOX)  injection  40 mg Subcutaneous Q24H   haloperidol  2 mg Oral BID   hydrocerin   Topical BID   mirtazapine  15 mg Oral QHS   multivitamin with minerals  1 tablet Oral Daily   polyethylene glycol  17 g Oral Daily   senna-docusate  2 tablet Oral QHS   Continuous Infusions:  sodium chloride       LOS: 290 days   Osvaldo Shipper, MD [facility]  If 7PM-7AM, please contact night-coverage  10/12/2022, 10:46 AM

## 2022-10-12 NOTE — Plan of Care (Signed)

## 2022-10-13 NOTE — Progress Notes (Signed)
PROGRESS NOTE    Natalie Brown  ZOX:[id number] DOB: 1965/12/04 DOA: 12/24/2021 PCP: Lavinia Sharps, NP    Brief Narrative:  57 year old with a history of bipolar disorder and chronic dementia with advanced cerebral atrophy who was admitted to the acute units 12/24/2021 following a preceding admission at [facility] 09/06/2021-12/24/2021. She was initially admitted to [facility] with acute mania and aggressive behaviors. On 12/24/2021 she became nearly catatonic and was transferred to the ER and ultimately admitted. During her acute hospital stay she has been evaluated by Neurology and Psychiatry. EEG has revealed no evidence of seizure activity. Repeat brain imaging was unremarkable. It was ultimately determined that her AMS was due to due progression of her severe underlying dementia.   The patient has been deemed medically stable and is awaiting a disposition. The goal is to place her within the memory care unit but this is proving difficult as she is homeless and has no insurance. Per TOC patient's mother plans to go to DSS to apply for special assistance Medicaid.  No events all week. TOC working on safe dispo.    Assessment & Plan:  Acute Metabolic Encephalopathy on chronic progressive early onset Dementia: Nonverbal/noninteractive.   Seen by neurology and psychiatry.   Extensive workup has been negative.  At this point there is no expectation of improvement.   Scheduled medication-Haldol 2 mg twice daily, Remeron 15 mg at bedtime As needed medications-Atarax, Ativan, Zyprexa Patient had a fall on 6/21.  CT head was unremarkable.  According to nursing staff she is back to her baseline.   Bipolar Disorder Psych meds as above.   Vitamin B12 Deficiency:   Supplements ordered.    Essential hypertension  Not on any scheduled medications at this time.   Labs from 6/22 shows stability.  CKD Stage IIIa Stable.  Tobacco Abuse Resolved.    Sinus Tachycardia Noted occasionally.  Patient  became bradycardic after she was started on metoprolol which was subsequently stopped.  Echocardiogram was unremarkable.  TSH was normal.   Constipation/altered GI function -Bowel regimen  DVT prophylaxis: Lovenox Code Status: Full Family Communication: No family at bedside. Disposition: To be determined.  This is a no charge note.  Diet Orders (From admission, onward)     Start     Ordered   05/23/22 1257  DIET DYS 3 Room service appropriate? Yes; Fluid consistency: Thin  Diet effective now       Comments: Patient is not on isolation precautions  Question Answer Comment  Room service appropriate? Yes   Fluid consistency: Thin      05/23/22 1257            Subjective: Lying in the bed with her eyes closed.   Objective: Vitals:   10/11/22 2057 10/12/22 0623 10/12/22 2019 10/13/22 0426  BP: 97/61 104/66 108/69 121/84  Pulse: 67 (!) 113 (!) 106 86  Resp: 14  18 18   Temp: 98.4 F (36.9 C) 99.2 F (37.3 C) 97.9 F (36.6 C) 97.9 F (36.6 C)  TempSrc: Axillary  Axillary Oral  SpO2: 100% 98% 99% 100%  Weight:      Height:        Intake/Output Summary (Last 24 hours) at 10/13/2022 1102 Last data filed at 10/13/2022 0900 Gross per 24 hour  Intake 240 ml  Output 1 ml  Net 239 ml     Filed Weights   09/30/22 0500 10/08/22 0812 10/08/22 1442  Weight: 51.5 kg 51.6 kg 50.9 kg  Examination:  Does not answer questions.  Eyes are closed.  Does not appear to be in any distress.    Scheduled Meds:  vitamin B-12  500 mcg Oral Daily   enoxaparin (LOVENOX) injection  40 mg Subcutaneous Q24H   haloperidol  2 mg Oral BID   hydrocerin   Topical BID   mirtazapine  15 mg Oral QHS   multivitamin with minerals  1 tablet Oral Daily   polyethylene glycol  17 g Oral Daily   senna-docusate  2 tablet Oral QHS   Continuous Infusions:  sodium chloride       LOS: 291 days   Osvaldo Shipper, MD [facility]  If 7PM-7AM, please contact  night-coverage  10/13/2022, 11:02 AM

## 2022-10-14 NOTE — TOC Progression Note (Signed)
Transition of Care [facility]) - Progression Note    Patient Details  Name: Natalie Brown MRN: [id number] Date of Birth: [birth year]  Transition of Care [facility]) CM/SW Contact  Inis Sizer, LCSW Phone Number: 10/14/2022, 8:40 AM  Clinical Narrative:    CSW attempted to reach administrator Concepcion Elk) at [facility] without success - a voicemail was left requesting a return call.  CSW attempted to reach admissions at [facility] without success - a voicemail was left requesting a return call  CSW attempted to reach admissions Cicero Duck) at [facility] in [place] without success - a message was left with the secretary requesting a return call.  CSW attempted to reach admissions Revonda Standard) at [facility] without success - a message was left with the secretary requesting a return call.    Barriers to Discharge: Financial Resources, Inadequate or no insurance, Homeless with medical needs  Expected Discharge Plan and Services In-house Referral: Clinical Social Work   Post Acute Care Choice: Nursing Home Living arrangements for the past 2 months: Homeless [facility]) Expected Discharge Date: 12/24/21                                     Social Determinants of Health (SDOH) Interventions SDOH Screenings   Food Insecurity: No Food Insecurity (03/05/2022)  Housing: High Risk (03/21/2022)  Alcohol Screen: Low Risk  (09/06/2021)  Depression (PHQ2-9): Medium Risk (05/26/2020)  Tobacco Use: High Risk (03/21/2022)    Readmission Risk Interventions     No data to display

## 2022-10-14 NOTE — Progress Notes (Signed)
PROGRESS NOTE    Natalie Brown  ZOX:[id number] DOB: 1965-08-02 DOA: 12/24/2021 PCP: Lavinia Sharps, NP    Brief Narrative:  57 year old with a history of bipolar disorder and chronic dementia with advanced cerebral atrophy who was admitted to the acute units 12/24/2021 following a preceding admission at [facility] 09/06/2021-12/24/2021. She was initially admitted to [facility] with acute mania and aggressive behaviors. On 12/24/2021 she became nearly catatonic and was transferred to the ER and ultimately admitted. During her acute hospital stay she has been evaluated by Neurology and Psychiatry. EEG has revealed no evidence of seizure activity. Repeat brain imaging was unremarkable. It was ultimately determined that her AMS was due to due progression of her severe underlying dementia.   The patient has been deemed medically stable and is awaiting a disposition. The goal is to place her within the memory care unit but this is proving difficult as she is homeless and has no insurance. Per TOC patient's mother plans to go to DSS to apply for special assistance Medicaid.  No events all week. TOC working on safe dispo.    Assessment & Plan:  Acute Metabolic Encephalopathy on chronic progressive early onset Dementia: Nonverbal/noninteractive.   Seen by neurology and psychiatry.   Extensive workup has been negative.  At this point there is no expectation of improvement.   Scheduled medication-Haldol 2 mg twice daily, Remeron 15 mg at bedtime As needed medications-Atarax, Ativan, Zyprexa Patient had a fall on 6/21.  CT head was unremarkable.  Patient returned back to baseline. No acute issues reported by nursing staff today.   Bipolar Disorder Psych meds as above.   Vitamin B12 Deficiency:   Supplements ordered.    Essential hypertension  Not on any scheduled medications at this time.   Labs from 6/22 shows stability.  CKD Stage IIIa Stable.  Tobacco Abuse Resolved.    Sinus  Tachycardia Noted occasionally.  Patient became bradycardic after she was started on metoprolol which was subsequently stopped.  Echocardiogram was unremarkable.  TSH was normal.   Constipation/altered GI function Bowel regimen  DVT prophylaxis: Lovenox Code Status: Full Family Communication: No family at bedside. Disposition: To be determined.  This is a no charge note.  Diet Orders (From admission, onward)     Start     Ordered   05/23/22 1257  DIET DYS 3 Room service appropriate? Yes; Fluid consistency: Thin  Diet effective now       Comments: Patient is not on isolation precautions  Question Answer Comment  Room service appropriate? Yes   Fluid consistency: Thin      05/23/22 1257            Subjective: Nursing staff assisting with breakfast this morning.   Objective: Vitals:   10/13/22 2112 10/14/22 0443 10/14/22 0910 10/14/22 1001  BP: 102/63 (!) 117/99 (!) 115/93 (!) 112/97  Pulse: 80 100 (!) 111 (!) 106  Resp: 16 18 18 18   Temp: 98 F (36.7 C) 98.5 F (36.9 C) 99 F (37.2 C) 98.5 F (36.9 C)  TempSrc: Oral Oral Oral Oral  SpO2: 100% 100% 100%   Weight:      Height:        Intake/Output Summary (Last 24 hours) at 10/14/2022 1051 Last data filed at 10/14/2022 0900 Gross per 24 hour  Intake 360 ml  Output --  Net 360 ml     Filed Weights   09/30/22 0500 10/08/22 0812 10/08/22 1442  Weight: 51.5 kg 51.6 kg 50.9  kg    Examination:  Does not answer questions.  Sitting on the edge of the bed.  Does not appear to be in any discomfort or distress.    Scheduled Meds:  vitamin B-12  500 mcg Oral Daily   enoxaparin (LOVENOX) injection  40 mg Subcutaneous Q24H   haloperidol  2 mg Oral BID   hydrocerin   Topical BID   mirtazapine  15 mg Oral QHS   multivitamin with minerals  1 tablet Oral Daily   polyethylene glycol  17 g Oral Daily   senna-docusate  2 tablet Oral QHS   Continuous Infusions:  sodium chloride       LOS: 292 days   Osvaldo Shipper, MD [facility]  If 7PM-7AM, please contact night-coverage  10/14/2022, 10:51 AM

## 2022-10-15 NOTE — Plan of Care (Signed)

## 2022-10-15 NOTE — Plan of Care (Signed)
Patient alert, non-verbal.  Pt walked around room, redirectable.  Refused vitals last night.  All meds given on time as ordered.  POC maintained, will continue to monitor.  Problem: Education: Goal: Knowledge of General Education information will improve Description: Including pain rating scale, medication(s)/side effects and non-pharmacologic comfort measures Outcome: Progressing   Problem: Health Behavior/Discharge Planning: Goal: Ability to manage health-related needs will improve Outcome: Progressing   Problem: Clinical Measurements: Goal: Ability to maintain clinical measurements within normal limits will improve Outcome: Progressing Goal: Will remain free from infection Outcome: Progressing Goal: Diagnostic test results will improve Outcome: Progressing Goal: Respiratory complications will improve Outcome: Progressing Goal: Cardiovascular complication will be avoided Outcome: Progressing   Problem: Activity: Goal: Risk for activity intolerance will decrease Outcome: Progressing   Problem: Nutrition: Goal: Adequate nutrition will be maintained Outcome: Progressing   Problem: Coping: Goal: Level of anxiety will decrease Outcome: Progressing   Problem: Elimination: Goal: Will not experience complications related to bowel motility Outcome: Progressing Goal: Will not experience complications related to urinary retention Outcome: Progressing   Problem: Pain Managment: Goal: General experience of comfort will improve Outcome: Progressing   Problem: Safety: Goal: Ability to remain free from injury will improve Outcome: Progressing   Problem: Skin Integrity: Goal: Risk for impaired skin integrity will decrease Outcome: Progressing

## 2022-10-15 NOTE — Progress Notes (Signed)
PROGRESS NOTE    Natalie Brown  MWU:[id number] DOB: 22-Jul-1965 DOA: 12/24/2021 PCP: Lavinia Sharps, NP    Brief Narrative:  57 year old with a history of bipolar disorder and chronic dementia with advanced cerebral atrophy who was admitted to the acute units 12/24/2021 following a preceding admission at [facility] 09/06/2021-12/24/2021. She was initially admitted to [facility] with acute mania and aggressive behaviors. On 12/24/2021 she became nearly catatonic and was transferred to the ER and ultimately admitted. During her acute hospital stay she has been evaluated by Neurology and Psychiatry. EEG has revealed no evidence of seizure activity. Repeat brain imaging was unremarkable. It was ultimately determined that her AMS was due to due progression of her severe underlying dementia.   The patient has been deemed medically stable and is awaiting a disposition. The goal is to place her within the memory care unit but this is proving difficult as she is homeless and has no insurance. Per TOC patient's mother plans to go to DSS to apply for special assistance Medicaid.  No events all week. TOC working on safe dispo.    Assessment & Plan:  Acute Metabolic Encephalopathy on chronic progressive early onset Dementia: Nonverbal/noninteractive.   Seen by neurology and psychiatry.   Extensive workup has been negative.  At this point there is no expectation of improvement.   Scheduled medication-Haldol 2 mg twice daily, Remeron 15 mg at bedtime As needed medications-Atarax, Ativan, Zyprexa Patient had a fall on 6/21.  CT head was unremarkable.  Patient returned back to baseline. Remains stable for the most part.   Bipolar Disorder Psych meds as above.   Vitamin B12 Deficiency:   Supplements ordered.    Essential hypertension  Not on any scheduled medications at this time.   Labs from 6/22 shows stability.  CKD Stage IIIa Stable.  Tobacco Abuse Resolved.    Sinus Tachycardia Noted  occasionally.  Patient became bradycardic after she was started on metoprolol which was subsequently stopped.  Echocardiogram was unremarkable.  TSH was normal.   Constipation/altered GI function Bowel regimen  DVT prophylaxis: Lovenox Code Status: Full Family Communication: No family at bedside. Disposition: To be determined.  THIS IS A NO CHARGE NOTE.  Diet Orders (From admission, onward)     Start     Ordered   05/23/22 1257  DIET DYS 3 Room service appropriate? Yes; Fluid consistency: Thin  Diet effective now       Comments: Patient is not on isolation precautions  Question Answer Comment  Room service appropriate? Yes   Fluid consistency: Thin      05/23/22 1257            Subjective: Awake.  Sitting at the side of the bed with a fork in her hand, trying to eat breakfast.   Objective: Vitals:   10/14/22 0443 10/14/22 0910 10/14/22 1001 10/15/22 0600  BP: (!) 117/99 (!) 115/93 (!) 112/97 98/67  Pulse: 100 (!) 111 (!) 106 71  Resp: 18 18 18 14   Temp: 98.5 F (36.9 C) 99 F (37.2 C) 98.5 F (36.9 C) 98.4 F (36.9 C)  TempSrc: Oral Oral Oral Oral  SpO2: 100% 100%  100%  Weight:      Height:        Intake/Output Summary (Last 24 hours) at 10/15/2022 1050 Last data filed at 10/15/2022 0901 Gross per 24 hour  Intake 390 ml  Output --  Net 390 ml     Filed Weights   09/30/22 0500 10/08/22  2956 10/08/22 1442  Weight: 51.5 kg 51.6 kg 50.9 kg    Examination:  Awake alert.  Does not answer questions.  Same as before.   Scheduled Meds:  vitamin B-12  500 mcg Oral Daily   enoxaparin (LOVENOX) injection  40 mg Subcutaneous Q24H   haloperidol  2 mg Oral BID   hydrocerin   Topical BID   mirtazapine  15 mg Oral QHS   multivitamin with minerals  1 tablet Oral Daily   polyethylene glycol  17 g Oral Daily   senna-docusate  2 tablet Oral QHS   Continuous Infusions:  sodium chloride       LOS: 293 days   Osvaldo Shipper, MD [facility]  If  7PM-7AM, please contact night-coverage  10/15/2022, 10:50 AM

## 2022-10-16 NOTE — TOC Progression Note (Signed)
Transition of Care [facility]) - Progression Note    Patient Details  Name: Natalie Brown MRN: [id number] Date of Birth: [birth year]  Transition of Care [facility]) CM/SW Contact  Inis Sizer, LCSW Phone Number: 10/16/2022, 8:36 AM  Clinical Narrative:    There are no discharge planning updates available at this time. CSW has been unable to locate a safe disposition plan for patient. CSW will continue to follow for discharge planning purposes.    Barriers to Discharge: Financial Resources, Inadequate or no insurance, Homeless with medical needs  Expected Discharge Plan and Services In-house Referral: Clinical Social Work   Post Acute Care Choice: Nursing Home Living arrangements for the past 2 months: Homeless [facility]) Expected Discharge Date: 12/24/21                                     Social Determinants of Health (SDOH) Interventions SDOH Screenings   Food Insecurity: No Food Insecurity (03/05/2022)  Housing: High Risk (03/21/2022)  Alcohol Screen: Low Risk  (09/06/2021)  Depression (PHQ2-9): Medium Risk (05/26/2020)  Tobacco Use: High Risk (03/21/2022)    Readmission Risk Interventions     No data to display

## 2022-10-16 NOTE — Progress Notes (Signed)
[facility]                                                                               Natalie Brown, is a 57 y.o. female, DOB - Jul 11, 1965, UJW:[id number] Admit date - 12/24/2021    Outpatient Primary MD for the patient is [name], [name], NP  LOS - 294  days    Brief summary   57 year old with a history of bipolar disorder and chronic dementia with advanced cerebral atrophy who was admitted to the acute units 12/24/2021 following a preceding admission at [facility] 09/06/2021-12/24/2021. She was initially admitted to [facility] with acute mania and aggressive behaviors. On 12/24/2021 she became nearly catatonic and was transferred to the ER and ultimately admitted. During her acute hospital stay she has been evaluated by Neurology and Psychiatry. EEG has revealed no evidence of seizure activity. Repeat brain imaging was unremarkable. It was ultimately determined that her AMS was due to due progression of her severe underlying dementia.   The patient has been deemed medically stable and is awaiting a disposition. The goal is to place her within the memory care unit but this is proving difficult as she is homeless and has no insurance. Per TOC patient's mother plans to go to DSS to apply for special assistance Medicaid.   Assessment & Plan    Assessment and Plan:  Acute Metabolic Encephalopathy on chronic progressive early onset Dementia: Nonverbal/noninteractive.   Seen by neurology and psychiatry.   Extensive workup has been negative.  At this point there is no expectation of improvement.   Scheduled medication-Haldol 2 mg twice daily, Remeron 15 mg at bedtime As needed medications-Atarax, Ativan, Zyprexa Patient had a fall on 6/21.  CT head was unremarkable.  Patient returned back to baseline. Remains stable for the most part.   Bipolar Disorder Psych meds as above.   Vitamin B12 Deficiency:   Supplements ordered.    Essential hypertension  Not on any scheduled  medications at this time.   Labs from 6/22 shows stability.   CKD Stage IIIa Stable.   Tobacco Abuse Resolved.    Sinus Tachycardia Noted occasionally.  Patient became bradycardic after she was started on metoprolol which was subsequently stopped.  Echocardiogram was unremarkable.  TSH was normal.   Constipation/altered GI function Bowel regimen    Malnutrition Type:  Nutrition Problem: Altered GI function Etiology: constipation   Malnutrition Characteristics:  Signs/Symptoms: other (comment) (last BM 10 days ago)   Nutrition Interventions:  Interventions: Other (Comment) (scheduled bowel regimen per MD)  Estimated body mass index is 18.11 kg/m as calculated from the following:   Height as of this encounter: 5\' 6"  (1.676 m).   Weight as of this encounter: 50.9 kg.  Code Status: full code.  DVT Prophylaxis:  enoxaparin (LOVENOX) injection 40 mg Start: 12/26/21 1200   Level of Care: Level of care: Med-Surg Family Communication: none at bedside.  Disposition Plan:     Remains inpatient appropriate:    Procedures:  None.   Consultants:   Psychiatry.   Antimicrobials:   Anti-infectives (From admission, onward)    None        Medications  Scheduled Meds:  vitamin B-12  500 mcg Oral Daily   enoxaparin (LOVENOX) injection  40 mg Subcutaneous Q24H   haloperidol  2 mg Oral BID   hydrocerin   Topical BID   mirtazapine  15 mg Oral QHS   multivitamin with minerals  1 tablet Oral Daily   polyethylene glycol  17 g Oral Daily   senna-docusate  2 tablet Oral QHS   Continuous Infusions:  sodium chloride     PRN Meds:.acetaminophen, alum & mag hydroxide-simeth, bisacodyl, fluticasone, hydrALAZINE, hydrOXYzine, ipratropium-albuterol, LORazepam, magnesium hydroxide, metoprolol tartrate, OLANZapine **OR** OLANZapine, ondansetron, mouth rinse    Subjective:   Natalie Brown was seen and examined today.  No new complaints.   Objective:   Vitals:    10/15/22 0600 10/15/22 2100 10/16/22 0645 10/16/22 0653  BP: 98/67 98/61 108/87   Pulse: 71 63 (!) 121 92  Resp: 14 16 16    Temp: 98.4 F (36.9 C) 97.8 F (36.6 C) 98.6 F (37 C)   TempSrc: Oral Axillary Oral   SpO2: 100% 100%    Weight:      Height:        Intake/Output Summary (Last 24 hours) at 10/16/2022 1433 Last data filed at 10/16/2022 0630 Gross per 24 hour  Intake --  Output 1 ml  Net -1 ml   Filed Weights   09/30/22 0500 10/08/22 0812 10/08/22 1442  Weight: 51.5 kg 51.6 kg 50.9 kg     Exam General exam: Appears calm and comfortable  Respiratory system: Clear to auscultation. Respiratory effort normal. Cardiovascular system: S1 & S2 heard, RRR. No JVD, Gastrointestinal system: Abdomen is nondistended, soft and nontender. Central nervous system: Alert, non verbal.  Extremities: Symmetric 5 x 5 power. Skin: No rashes, Psychiatry: no agitation.   Data Reviewed:  I have personally reviewed following labs and imaging studies   CBC Lab Results  Component Value Date   WBC 4.5 10/11/2022   RBC 4.29 10/11/2022   HGB 12.3 10/11/2022   HCT 36.7 10/11/2022   MCV 85.5 10/11/2022   MCH 28.7 10/11/2022   PLT 244 10/11/2022   MCHC 33.5 10/11/2022   RDW 14.1 10/11/2022   LYMPHSABS 1.4 09/04/2022   MONOABS 0.3 09/04/2022   EOSABS 0.1 09/04/2022   BASOSABS 0.0 09/04/2022     Last metabolic panel Lab Results  Component Value Date   NA 141 10/11/2022   K 3.8 10/11/2022   CL 110 10/11/2022   CO2 24 10/11/2022   BUN 20 10/11/2022   CREATININE 1.18 (H) 10/11/2022   GLUCOSE 84 10/11/2022   GFRNONAA 55 (L) 10/11/2022   GFRAA 65 09/19/2017   CALCIUM 8.8 (L) 10/11/2022   PHOS 3.5 09/04/2022   PROT 6.8 09/04/2022   ALBUMIN 3.6 09/04/2022   LABGLOB 2.8 06/21/2020   AGRATIO 1.5 06/21/2020   BILITOT 0.4 09/04/2022   ALKPHOS 56 09/04/2022   AST 37 09/04/2022   ALT 23 09/04/2022   ANIONGAP 7 10/11/2022    CBG (last 3)  No results for input(s): "GLUCAP" in  the last 72 hours.    Coagulation Profile: No results for input(s): "INR", "PROTIME" in the last 168 hours.   Radiology Studies: No results found.     Kathlen Mody M.D. [facility] 10/16/2022, 2:33 PM  Available via Epic secure chat 7am-7pm After 7 pm, please refer to night coverage provider listed on amion.

## 2022-10-16 NOTE — Plan of Care (Signed)

## 2022-10-17 NOTE — Progress Notes (Signed)
[facility]                                                                               Natalie Brown, is a 57 y.o. female, DOB - 05/02/1965, ZOX:[id number] Admit date - 12/24/2021    Outpatient Primary MD for the patient is [name], [name], NP  LOS - 295  days    Brief summary   57 year old with a history of bipolar disorder and chronic dementia with advanced cerebral atrophy who was admitted to the acute units 12/24/2021 following a preceding admission at [facility] 09/06/2021-12/24/2021. She was initially admitted to [facility] with acute mania and aggressive behaviors. On 12/24/2021 she became nearly catatonic and was transferred to the ER and ultimately admitted. During her acute hospital stay she has been evaluated by Neurology and Psychiatry. EEG has revealed no evidence of seizure activity. Repeat brain imaging was unremarkable. It was ultimately determined that her AMS was due to due progression of her severe underlying dementia.   The patient has been deemed medically stable and is awaiting a disposition. The goal is to place her within the memory care unit but this is proving difficult as she is homeless and has no insurance. Per TOC patient's mother plans to go to DSS to apply for special assistance Medicaid.  No new events overnight.   Assessment & Plan    Assessment and Plan:  Acute Metabolic Encephalopathy on chronic progressive early onset Dementia: Nonverbal/noninteractive.   Seen by neurology and psychiatry.   Extensive workup has been negative.  At this point there is no expectation of improvement.   Scheduled medication-Haldol 2 mg twice daily, Remeron 15 mg at bedtime As needed medications-Atarax, Ativan, Zyprexa Patient had a fall on 6/21.  CT head was unremarkable. No new complaints overnight.    Bipolar Disorder Psych meds as above.   Vitamin B12 Deficiency:   Supplements ordered.    Essential hypertension  Not on any scheduled medications at  this time.   Labs from 6/22 shows stability.   CKD Stage IIIa Stable.   Tobacco Abuse Resolved.    Sinus Tachycardia Noted occasionally.  Patient became bradycardic after she was started on metoprolol which was subsequently stopped.  Echocardiogram was unremarkable.  TSH was normal.   Constipation/altered GI function Bowel regimen    Malnutrition Type:  Nutrition Problem: Altered GI function Etiology: constipation   Malnutrition Characteristics:  Signs/Symptoms: other (comment) (last BM 10 days ago)   Nutrition Interventions:  Interventions: Other (Comment) (scheduled bowel regimen per MD)  Estimated body mass index is 18.18 kg/m as calculated from the following:   Height as of this encounter: 5\' 6"  (1.676 m).   Weight as of this encounter: 51.1 kg.  Code Status: full code.  DVT Prophylaxis:  enoxaparin (LOVENOX) injection 40 mg Start: 12/26/21 1200   Level of Care: Level of care: Med-Surg Family Communication: none at bedside.  Disposition Plan:     Remains inpatient appropriate:    Procedures:  None.   Consultants:   Psychiatry.   Antimicrobials:   Anti-infectives (From admission, onward)    None        Medications  Scheduled Meds:  vitamin  B-12  500 mcg Oral Daily   enoxaparin (LOVENOX) injection  40 mg Subcutaneous Q24H   haloperidol  2 mg Oral BID   hydrocerin   Topical BID   mirtazapine  15 mg Oral QHS   multivitamin with minerals  1 tablet Oral Daily   polyethylene glycol  17 g Oral Daily   senna-docusate  2 tablet Oral QHS   Continuous Infusions:  sodium chloride     PRN Meds:.acetaminophen, alum & mag hydroxide-simeth, bisacodyl, fluticasone, hydrALAZINE, hydrOXYzine, ipratropium-albuterol, LORazepam, magnesium hydroxide, metoprolol tartrate, OLANZapine **OR** OLANZapine, ondansetron, mouth rinse    Subjective:   Natalie Brown was seen and examined today.   Did say a few words this morning.   Objective:   Vitals:    10/16/22 2209 10/17/22 0307 10/17/22 0516 10/17/22 0714  BP: 108/74 (!) 146/91 127/76 (!) 148/59  Pulse: 88 (!) 110 96 73  Resp: 18  18 18   Temp: (!) 97.3 F (36.3 C) 98.4 F (36.9 C) 98.3 F (36.8 C) 97.8 F (36.6 C)  TempSrc:      SpO2: 97%  100% 100%  Weight:  51.1 kg    Height:        Intake/Output Summary (Last 24 hours) at 10/17/2022 1653 Last data filed at 10/17/2022 0900 Gross per 24 hour  Intake 377 ml  Output --  Net 377 ml    Filed Weights   10/08/22 0812 10/08/22 1442 10/17/22 0307  Weight: 51.6 kg 50.9 kg 51.1 kg     Exam General exam: Appears calm and comfortable  Respiratory system: Clear to auscultation. Respiratory effort normal. Cardiovascular system: S1 & S2 heard, RRR. Gastrointestinal system: Abdomen is nondistended, soft and nontender.  Central nervous system: Alert and comfortable.  Extremities: Symmetric 5 x 5 power. Skin: No rashes, lesions or ulcers Psychiatry: calm.    Data Reviewed:  I have personally reviewed following labs and imaging studies   CBC Lab Results  Component Value Date   WBC 4.5 10/11/2022   RBC 4.29 10/11/2022   HGB 12.3 10/11/2022   HCT 36.7 10/11/2022   MCV 85.5 10/11/2022   MCH 28.7 10/11/2022   PLT 244 10/11/2022   MCHC 33.5 10/11/2022   RDW 14.1 10/11/2022   LYMPHSABS 1.4 09/04/2022   MONOABS 0.3 09/04/2022   EOSABS 0.1 09/04/2022   BASOSABS 0.0 09/04/2022     Last metabolic panel Lab Results  Component Value Date   NA 141 10/11/2022   K 3.8 10/11/2022   CL 110 10/11/2022   CO2 24 10/11/2022   BUN 20 10/11/2022   CREATININE 1.18 (H) 10/11/2022   GLUCOSE 84 10/11/2022   GFRNONAA 55 (L) 10/11/2022   GFRAA 65 09/19/2017   CALCIUM 8.8 (L) 10/11/2022   PHOS 3.5 09/04/2022   PROT 6.8 09/04/2022   ALBUMIN 3.6 09/04/2022   LABGLOB 2.8 06/21/2020   AGRATIO 1.5 06/21/2020   BILITOT 0.4 09/04/2022   ALKPHOS 56 09/04/2022   AST 37 09/04/2022   ALT 23 09/04/2022   ANIONGAP 7 10/11/2022    CBG  (last 3)  No results for input(s): "GLUCAP" in the last 72 hours.    Coagulation Profile: No results for input(s): "INR", "PROTIME" in the last 168 hours.   Radiology Studies: No results found.     Kathlen Mody M.D. [facility] 10/17/2022, 4:53 PM  Available via Epic secure chat 7am-7pm After 7 pm, please refer to night coverage provider listed on amion.

## 2022-10-17 NOTE — Plan of Care (Signed)
Pt remains nonverbal. Problem: Education: Goal: Knowledge of General Education information will improve Description: Including pain rating scale, medication(s)/side effects and non-pharmacologic comfort measures Outcome: Progressing   Problem: Health Behavior/Discharge Planning: Goal: Ability to manage health-related needs will improve Outcome: Progressing   Problem: Clinical Measurements: Goal: Ability to maintain clinical measurements within normal limits will improve Outcome: Progressing Goal: Will remain free from infection Outcome: Progressing Goal: Diagnostic test results will improve Outcome: Progressing Goal: Respiratory complications will improve Outcome: Progressing Goal: Cardiovascular complication will be avoided Outcome: Progressing   Problem: Activity: Goal: Risk for activity intolerance will decrease Outcome: Progressing   Problem: Nutrition: Goal: Adequate nutrition will be maintained Outcome: Progressing   Problem: Coping: Goal: Level of anxiety will decrease Outcome: Progressing   Problem: Elimination: Goal: Will not experience complications related to bowel motility Outcome: Progressing Goal: Will not experience complications related to urinary retention Outcome: Progressing   Problem: Pain Managment: Goal: General experience of comfort will improve Outcome: Progressing   Problem: Safety: Goal: Ability to remain free from injury will improve Outcome: Progressing   Problem: Skin Integrity: Goal: Risk for impaired skin integrity will decrease Outcome: Progressing

## 2022-10-18 NOTE — Progress Notes (Signed)
[facility]                                                                               Natalie Brown, is a 57 y.o. female, DOB - 04-Jun-1965, ZOX:[id number] Admit date - 12/24/2021    Outpatient Primary MD for the patient is [name], [name], NP  LOS - 296  days    Brief summary   57 year old with a history of bipolar disorder and chronic dementia with advanced cerebral atrophy who was admitted to the acute units 12/24/2021 following a preceding admission at [facility] 09/06/2021-12/24/2021. She was initially admitted to [facility] with acute mania and aggressive behaviors. On 12/24/2021 she became nearly catatonic and was transferred to the ER and ultimately admitted. During her acute hospital stay she has been evaluated by Neurology and Psychiatry. EEG has revealed no evidence of seizure activity. Repeat brain imaging was unremarkable. It was ultimately determined that her AMS was due to due progression of her severe underlying dementia.   The patient has been deemed medically stable and is awaiting a disposition. The goal is to place her within the memory care unit but this is proving difficult as she is homeless and has no insurance. Per TOC patient's mother plans to go to DSS to apply for special assistance Medicaid.  No new events overnight.   Assessment & Plan    Assessment and Plan:  Acute Metabolic Encephalopathy on chronic progressive early onset Dementia: Nonverbal/noninteractive.   Seen by neurology and psychiatry.   Extensive workup has been negative.  At this point there is no expectation of improvement.   Scheduled medication-Haldol 2 mg twice daily, Remeron 15 mg at bedtime As needed medications-Atarax, Ativan, Zyprexa Patient had a fall on 6/21.  CT head was unremarkable. No new complaints this morning.    Bipolar Disorder Psych meds as above.   Vitamin B12 Deficiency:   Supplements ordered.    Essential hypertension  Not on any scheduled medications  at this time.   Well controlled.    CKD Stage IIIa Stable.   Tobacco Abuse Resolved.    Sinus Tachycardia Noted occasionally.  Patient became bradycardic after she was started on metoprolol which was subsequently stopped.  Echocardiogram was unremarkable.  TSH was normal.   Constipation/altered GI function Bowel regimen    Malnutrition Type:  Nutrition Problem: Altered GI function Etiology: constipation   Malnutrition Characteristics:  Signs/Symptoms: other (comment) (last BM 10 days ago)   Nutrition Interventions:  Interventions: Other (Comment) (scheduled bowel regimen per MD)  Estimated body mass index is 18.18 kg/m as calculated from the following:   Height as of this encounter: 5\' 6"  (1.676 m).   Weight as of this encounter: 51.1 kg.  Code Status: full code.  DVT Prophylaxis:  enoxaparin (LOVENOX) injection 40 mg Start: 12/26/21 1200   Level of Care: Level of care: Med-Surg Family Communication: none at bedside.  Disposition Plan:     Remains inpatient appropriate:  awaiting placement.   Procedures:  None.   Consultants:   Psychiatry.   Antimicrobials:   Anti-infectives (From admission, onward)    None        Medications  Scheduled Meds:  vitamin B-12  500 mcg Oral Daily   enoxaparin (LOVENOX) injection  40 mg Subcutaneous Q24H   haloperidol  2 mg Oral BID   hydrocerin   Topical BID   mirtazapine  15 mg Oral QHS   multivitamin with minerals  1 tablet Oral Daily   polyethylene glycol  17 g Oral Daily   senna-docusate  2 tablet Oral QHS   Continuous Infusions:  sodium chloride     PRN Meds:.acetaminophen, alum & mag hydroxide-simeth, bisacodyl, fluticasone, hydrALAZINE, hydrOXYzine, ipratropium-albuterol, LORazepam, magnesium hydroxide, metoprolol tartrate, OLANZapine **OR** OLANZapine, ondansetron, mouth rinse    Subjective:   Natalie Brown was seen and examined today.   No new complaints.   Objective:   Vitals:   10/17/22  0714 10/17/22 2152 10/18/22 0328 10/18/22 0934  BP: (!) 148/59 (!) 101/57 96/65 123/78  Pulse: 73 84 (!) 56 89  Resp: 18 18 17 20   Temp: 97.8 F (36.6 C) 97.8 F (36.6 C)  98.5 F (36.9 C)  TempSrc:    Axillary  SpO2: 100% 99% 100% 99%  Weight:      Height:        Intake/Output Summary (Last 24 hours) at 10/18/2022 1121 Last data filed at 10/18/2022 0944 Gross per 24 hour  Intake 118 ml  Output 80 ml  Net 38 ml    Filed Weights   10/08/22 0812 10/08/22 1442 10/17/22 0307  Weight: 51.6 kg 50.9 kg 51.1 kg     Exam General exam: Appears calm and comfortable  Respiratory system: Clear to auscultation. Respiratory effort normal. Cardiovascular system: S1 & S2 heard, RRR.  Gastrointestinal system: Abdomen is nondistended, soft and nontender.  Central nervous system: Alert and comfortable.  Extremities: no edema.  Skin: No rashes,  Psychiatry: Mood & affect appropriate.      Data Reviewed:  I have personally reviewed following labs and imaging studies   CBC Lab Results  Component Value Date   WBC 4.5 10/11/2022   RBC 4.29 10/11/2022   HGB 12.3 10/11/2022   HCT 36.7 10/11/2022   MCV 85.5 10/11/2022   MCH 28.7 10/11/2022   PLT 244 10/11/2022   MCHC 33.5 10/11/2022   RDW 14.1 10/11/2022   LYMPHSABS 1.4 09/04/2022   MONOABS 0.3 09/04/2022   EOSABS 0.1 09/04/2022   BASOSABS 0.0 09/04/2022     Last metabolic panel Lab Results  Component Value Date   NA 141 10/11/2022   K 3.8 10/11/2022   CL 110 10/11/2022   CO2 24 10/11/2022   BUN 20 10/11/2022   CREATININE 1.18 (H) 10/11/2022   GLUCOSE 84 10/11/2022   GFRNONAA 55 (L) 10/11/2022   GFRAA 65 09/19/2017   CALCIUM 8.8 (L) 10/11/2022   PHOS 3.5 09/04/2022   PROT 6.8 09/04/2022   ALBUMIN 3.6 09/04/2022   LABGLOB 2.8 06/21/2020   AGRATIO 1.5 06/21/2020   BILITOT 0.4 09/04/2022   ALKPHOS 56 09/04/2022   AST 37 09/04/2022   ALT 23 09/04/2022   ANIONGAP 7 10/11/2022    CBG (last 3)  No results for  input(s): "GLUCAP" in the last 72 hours.    Coagulation Profile: No results for input(s): "INR", "PROTIME" in the last 168 hours.   Radiology Studies: No results found.     Kathlen Mody M.D. [facility] 10/18/2022, 11:21 AM  Available via Epic secure chat 7am-7pm After 7 pm, please refer to night coverage provider listed on amion.

## 2022-10-18 NOTE — Plan of Care (Signed)

## 2022-10-18 NOTE — TOC Progression Note (Signed)
Transition of Care [facility]) - Progression Note    Patient Details  Name: Natalie Brown MRN: [id number] Date of Birth: 10-Sep-1965  Transition of Care [facility]) CM/SW Contact  Inis Sizer, LCSW Phone Number: 10/18/2022, 10:44 AM  Clinical Narrative:    CSW spoke with Britta Mccreedy at [facility] to request advice regarding placement options for patient. Britta Mccreedy states that due to [employer] and [facility] guidelines, that patient's that are not at least 55 years old are considered to be not appropriate for memory care facilities due to the ages of the other patients in the facility. Britta Mccreedy suggested CSW contact [facility] to inquire about placement. Britta Mccreedy provided CSW with an email for the resident care coordinator at [facility] - [email or web address]  CSW sent secure email to the address above to inquire about bed availability.    Barriers to Discharge: Financial Resources, Inadequate or no insurance, Homeless with medical needs  Expected Discharge Plan and Services In-house Referral: Clinical Social Work   Post Acute Care Choice: Nursing Home Living arrangements for the past 2 months: Homeless [facility]) Expected Discharge Date: 12/24/21                                     Social Determinants of Health (SDOH) Interventions SDOH Screenings   Food Insecurity: No Food Insecurity (03/05/2022)  Housing: High Risk (03/21/2022)  Alcohol Screen: Low Risk  (09/06/2021)  Depression (PHQ2-9): Medium Risk (05/26/2020)  Tobacco Use: High Risk (03/21/2022)    Readmission Risk Interventions     No data to display

## 2022-10-19 NOTE — Progress Notes (Signed)
[facility]                                                                               Natalie Brown, is a 57 y.o. female, DOB - 30-May-1965, WUJ:[id number] Admit date - 12/24/2021    Outpatient Primary MD for the patient is Natalie Brown, Natalie Abrahams, NP  LOS - 297  days    Brief summary   57 year old with a history of bipolar disorder and chronic dementia with advanced cerebral atrophy who was admitted to the acute units 12/24/2021 following a preceding admission at [facility] 09/06/2021-12/24/2021. She was initially admitted to [facility] with acute mania and aggressive behaviors. On 12/24/2021 she became nearly catatonic and was transferred to the ER and ultimately admitted. During her acute hospital stay she has been evaluated by Neurology and Psychiatry. EEG has revealed no evidence of seizure activity. Repeat brain imaging was unremarkable. It was ultimately determined that her AMS was due to due progression of her severe underlying dementia.   The patient has been deemed medically stable and is awaiting a disposition. The goal is to place her within the memory care unit but this is proving difficult as she is homeless and has no insurance. Per TOC patient's mother plans to go to DSS to apply for special assistance Medicaid.  No new events overnight.   Assessment & Plan    Assessment and Plan:  Acute Metabolic Encephalopathy on chronic progressive early onset Dementia: Nonverbal/noninteractive.   Seen by neurology and psychiatry.   Extensive workup has been negative.  At this point there is no expectation of improvement.   Scheduled medication-Haldol 2 mg twice daily, Remeron 15 mg at bedtime As needed medications-Atarax, Ativan, Zyprexa Patient had a fall on 6/21.  CT head was unremarkable. No complaints today.    Bipolar Disorder Psych meds as above.   Vitamin B12 Deficiency:   Supplements ordered.    Essential hypertension  Not on any scheduled medications at this  time.   Optimal BP parameters.    CKD Stage IIIa Stable.   Tobacco Abuse Resolved.    Sinus Tachycardia Noted occasionally.  Patient became bradycardic after she was started on metoprolol which was subsequently stopped.  Echocardiogram was unremarkable.  TSH was normal.   Constipation/altered GI function Bowel regimen    Malnutrition Type:  Nutrition Problem: Altered GI function Etiology: constipation   Malnutrition Characteristics:  Signs/Symptoms: other (comment) (last BM 10 days ago)   Nutrition Interventions:  Interventions: Other (Comment) (scheduled bowel regimen per MD)  Estimated body mass index is 18.18 kg/m as calculated from the following:   Height as of this encounter: 5\' 6"  (1.676 m).   Weight as of this encounter: 51.1 kg.  Code Status: full code.  DVT Prophylaxis:  enoxaparin (LOVENOX) injection 40 mg Start: 12/26/21 1200   Level of Care: Level of care: Med-Surg Family Communication: none at bedside.  Disposition Plan:     Remains inpatient appropriate:  awaiting placement.   Procedures:  None.   Consultants:   Psychiatry.   Antimicrobials:   Anti-infectives (From admission, onward)    None        Medications  Scheduled Meds:  vitamin  B-12  500 mcg Oral Daily   enoxaparin (LOVENOX) injection  40 mg Subcutaneous Q24H   haloperidol  2 mg Oral BID   hydrocerin   Topical BID   mirtazapine  15 mg Oral QHS   multivitamin with minerals  1 tablet Oral Daily   polyethylene glycol  17 g Oral Daily   senna-docusate  2 tablet Oral QHS   Continuous Infusions:  sodium chloride     PRN Meds:.acetaminophen, alum & mag hydroxide-simeth, bisacodyl, fluticasone, hydrALAZINE, hydrOXYzine, ipratropium-albuterol, LORazepam, magnesium hydroxide, metoprolol tartrate, OLANZapine **OR** OLANZapine, ondansetron, mouth rinse    Subjective:   Natalie Brown was seen and examined today.   No new complaints.   Objective:   Vitals:   10/18/22  0934 10/18/22 1724 10/18/22 2025 10/18/22 2337  BP: 123/78 101/71 110/83 91/75  Pulse: 89 (!) 102 100 76  Resp: 20 20 18 18   Temp: 98.5 F (36.9 C) 98.2 F (36.8 C) 98.8 F (37.1 C) 99.6 F (37.6 C)  TempSrc: Axillary Axillary Oral Oral  SpO2: 99% 100% 100% 97%  Weight:      Height:        Intake/Output Summary (Last 24 hours) at 10/19/2022 1551 Last data filed at 10/19/2022 0956 Gross per 24 hour  Intake 218 ml  Output --  Net 218 ml    Filed Weights   10/08/22 0812 10/08/22 1442 10/17/22 0307  Weight: 51.6 kg 50.9 kg 51.1 kg     Exam General exam: Appears calm and comfortable  Respiratory system: Clear to auscultation. Respiratory effort normal. Cardiovascular system: S1 & S2 heard, RRR. No JVD,  Gastrointestinal system: Abdomen is nondistended, soft and nontender. Central nervous system: Alert and oriented. No focal neurological deficits. Extremities: no pedal edema.  Skin: No rashes,  Psychiatry:  Mood & affect appropriate.       Data Reviewed:  I have personally reviewed following labs and imaging studies   CBC Lab Results  Component Value Date   WBC 4.5 10/11/2022   RBC 4.29 10/11/2022   HGB 12.3 10/11/2022   HCT 36.7 10/11/2022   MCV 85.5 10/11/2022   MCH 28.7 10/11/2022   PLT 244 10/11/2022   MCHC 33.5 10/11/2022   RDW 14.1 10/11/2022   LYMPHSABS 1.4 09/04/2022   MONOABS 0.3 09/04/2022   EOSABS 0.1 09/04/2022   BASOSABS 0.0 09/04/2022     Last metabolic panel Lab Results  Component Value Date   NA 141 10/11/2022   K 3.8 10/11/2022   CL 110 10/11/2022   CO2 24 10/11/2022   BUN 20 10/11/2022   CREATININE 1.18 (H) 10/11/2022   GLUCOSE 84 10/11/2022   GFRNONAA 55 (L) 10/11/2022   GFRAA 65 09/19/2017   CALCIUM 8.8 (L) 10/11/2022   PHOS 3.5 09/04/2022   PROT 6.8 09/04/2022   ALBUMIN 3.6 09/04/2022   LABGLOB 2.8 06/21/2020   AGRATIO 1.5 06/21/2020   BILITOT 0.4 09/04/2022   ALKPHOS 56 09/04/2022   AST 37 09/04/2022   ALT 23  09/04/2022   ANIONGAP 7 10/11/2022    CBG (last 3)  No results for input(s): "GLUCAP" in the last 72 hours.    Coagulation Profile: No results for input(s): "INR", "PROTIME" in the last 168 hours.   Radiology Studies: No results found.     Kathlen Mody M.D. [facility] 10/19/2022, 3:51 PM  Available via Epic secure chat 7am-7pm After 7 pm, please refer to night coverage provider listed on amion.

## 2022-10-20 NOTE — Progress Notes (Signed)
[facility]                                                                               Natalie Brown, is a 57 y.o. female, DOB - Aug 05, 1965, ZOX:[id number] Admit date - 12/24/2021    Outpatient Primary MD for the patient is [name], [name], NP  LOS - 298  days    Brief summary   57 year old with a history of bipolar disorder and chronic dementia with advanced cerebral atrophy who was admitted to the acute units 12/24/2021 following a preceding admission at [facility] 09/06/2021-12/24/2021. She was initially admitted to [facility] with acute mania and aggressive behaviors. On 12/24/2021 she became nearly catatonic and was transferred to the ER and ultimately admitted. During her acute hospital stay she has been evaluated by Neurology and Psychiatry. EEG has revealed no evidence of seizure activity. Repeat brain imaging was unremarkable. It was ultimately determined that her AMS was due to due progression of her severe underlying dementia.   The patient has been deemed medically stable and is awaiting a disposition. The goal is to place her within the memory care unit but this is proving difficult as she is homeless and has no insurance. Per TOC patient's mother plans to go to DSS to apply for special assistance Medicaid.  No new events overnight. No new complaints.   Assessment & Plan    Assessment and Plan:  Acute Metabolic Encephalopathy on chronic progressive early onset Dementia: Nonverbal/noninteractive.   Seen by neurology and psychiatry.   Extensive workup has been negative.  At this point there is no expectation of improvement.   Scheduled medication-Haldol 2 mg twice daily, Remeron 15 mg at bedtime As needed medications-Atarax, Ativan, Zyprexa Patient had a fall on 6/21.  CT head was unremarkable. No complaints today.    Bipolar Disorder Psych meds as above.   Vitamin B12 Deficiency:   Supplements ordered.    Essential hypertension  Not on any scheduled  medications at this time.   Optimal BP parameters.    CKD Stage IIIa Stable.   Tobacco Abuse Resolved.    Sinus Tachycardia Noted occasionally.  Patient became bradycardic after she was started on metoprolol which was subsequently stopped.  Echocardiogram was unremarkable.  TSH was normal.   Constipation/altered GI function Bowel regimen. BM yesterday.     Malnutrition Type:  Nutrition Problem: Altered GI function Etiology: constipation   Malnutrition Characteristics:  Signs/Symptoms: other (comment) (last BM 10 days ago)   Nutrition Interventions:  Interventions: Other (Comment) (scheduled bowel regimen per MD)  Estimated body mass index is 18.18 kg/m as calculated from the following:   Height as of this encounter: 5\' 6"  (1.676 m).   Weight as of this encounter: 51.1 kg.  Code Status: full code.  DVT Prophylaxis:  enoxaparin (LOVENOX) injection 40 mg Start: 12/26/21 1200   Level of Care: Level of care: Med-Surg Family Communication: none at bedside.  Disposition Plan:     Remains inpatient appropriate:  awaiting placement.   Procedures:  None.   Consultants:   Psychiatry.   Antimicrobials:   Anti-infectives (From admission, onward)    None  Medications  Scheduled Meds:  vitamin B-12  500 mcg Oral Daily   enoxaparin (LOVENOX) injection  40 mg Subcutaneous Q24H   haloperidol  2 mg Oral BID   hydrocerin   Topical BID   mirtazapine  15 mg Oral QHS   multivitamin with minerals  1 tablet Oral Daily   polyethylene glycol  17 g Oral Daily   senna-docusate  2 tablet Oral QHS   Continuous Infusions:  sodium chloride     PRN Meds:.acetaminophen, alum & mag hydroxide-simeth, bisacodyl, fluticasone, hydrALAZINE, hydrOXYzine, ipratropium-albuterol, LORazepam, magnesium hydroxide, metoprolol tartrate, OLANZapine **OR** OLANZapine, ondansetron, mouth rinse    Subjective:   Natalie Brown was seen and examined today.   No complaints.     Objective:   Vitals:   10/18/22 2025 10/18/22 2337 10/19/22 1934 10/19/22 2003  BP: 110/83 91/75 (!) 90/47 119/80  Pulse: 100 76 (!) 115 93  Resp: 18 18 18 18   Temp: 98.8 F (37.1 C) 99.6 F (37.6 C) 98.5 F (36.9 C)   TempSrc: Oral Oral Oral   SpO2: 100% 97%    Weight:      Height:       No intake or output data in the 24 hours ending 10/20/22 1335  Filed Weights   10/08/22 0812 10/08/22 1442 10/17/22 0307  Weight: 51.6 kg 50.9 kg 51.1 kg     Exam General exam: Appears calm and comfortable  Respiratory system: Clear to auscultation. Respiratory effort normal. Cardiovascular system: S1 & S2 heard, RRR.  Gastrointestinal system: Abdomen is nondistended, soft and nontender.  Central nervous system: Alert and comfortable.  Extremities: Symmetric 5 x 5 power. Skin: No rashes, l        Data Reviewed:  I have personally reviewed following labs and imaging studies   CBC Lab Results  Component Value Date   WBC 4.5 10/11/2022   RBC 4.29 10/11/2022   HGB 12.3 10/11/2022   HCT 36.7 10/11/2022   MCV 85.5 10/11/2022   MCH 28.7 10/11/2022   PLT 244 10/11/2022   MCHC 33.5 10/11/2022   RDW 14.1 10/11/2022   LYMPHSABS 1.4 09/04/2022   MONOABS 0.3 09/04/2022   EOSABS 0.1 09/04/2022   BASOSABS 0.0 09/04/2022     Last metabolic panel Lab Results  Component Value Date   NA 141 10/11/2022   K 3.8 10/11/2022   CL 110 10/11/2022   CO2 24 10/11/2022   BUN 20 10/11/2022   CREATININE 1.18 (H) 10/11/2022   GLUCOSE 84 10/11/2022   GFRNONAA 55 (L) 10/11/2022   GFRAA 65 09/19/2017   CALCIUM 8.8 (L) 10/11/2022   PHOS 3.5 09/04/2022   PROT 6.8 09/04/2022   ALBUMIN 3.6 09/04/2022   LABGLOB 2.8 06/21/2020   AGRATIO 1.5 06/21/2020   BILITOT 0.4 09/04/2022   ALKPHOS 56 09/04/2022   AST 37 09/04/2022   ALT 23 09/04/2022   ANIONGAP 7 10/11/2022    CBG (last 3)  No results for input(s): "GLUCAP" in the last 72 hours.    Coagulation Profile: No results for  input(s): "INR", "PROTIME" in the last 168 hours.   Radiology Studies: No results found.     Kathlen Mody M.D. [facility] 10/20/2022, 1:35 PM  Available via Epic secure chat 7am-7pm After 7 pm, please refer to night coverage provider listed on amion.

## 2022-10-21 DIAGNOSIS — F05 Delirium due to known physiological condition: Secondary | ICD-10-CM

## 2022-10-21 NOTE — TOC Progression Note (Signed)
Transition of Care [facility]) - Progression Note    Patient Details  Name: SUMIKO LANZER MRN: [id number] Date of Birth: 05-09-1965  Transition of Care [facility]) CM/SW Contact  Inis Sizer, LCSW Phone Number: 10/21/2022, 9:33 AM  Clinical Narrative:    CSW left voicemail for Ms. Revels in admissions at [facility] requesting a return call.    Barriers to Discharge: Financial Resources, Inadequate or no insurance, Homeless with medical needs  Expected Discharge Plan and Services In-house Referral: Clinical Social Work   Post Acute Care Choice: Nursing Home Living arrangements for the past 2 months: Homeless [facility]) Expected Discharge Date: 12/24/21                                     Social Determinants of Health (SDOH) Interventions SDOH Screenings   Food Insecurity: No Food Insecurity (03/05/2022)  Housing: High Risk (03/21/2022)  Alcohol Screen: Low Risk  (09/06/2021)  Depression (PHQ2-9): Medium Risk (05/26/2020)  Tobacco Use: High Risk (03/21/2022)    Readmission Risk Interventions     No data to display

## 2022-10-21 NOTE — Progress Notes (Signed)
[facility]                                                                               [name], is a 57 y.o. female, DOB - 1965-07-11, ZOX:[id number] Admit date - 12/24/2021    Outpatient Primary MD for the patient is [name], [name], NP  LOS - 299  days    Brief summary   57 year old with a history of bipolar disorder and chronic dementia with advanced cerebral atrophy who was admitted to the acute units 12/24/2021 following a preceding admission at [facility] 09/06/2021-12/24/2021. She was initially admitted to [facility] with acute mania and aggressive behaviors. On 12/24/2021 she became nearly catatonic and was transferred to the ER and ultimately admitted. During her acute hospital stay she has been evaluated by Neurology and Psychiatry. EEG has revealed no evidence of seizure activity. Repeat brain imaging was unremarkable. It was ultimately determined that her AMS was due to due progression of her severe underlying dementia.   The patient has been deemed medically stable and is awaiting a disposition. The goal is to place her within the memory care unit but this is proving difficult as she is homeless and has no insurance. Per TOC patient's mother plans to go to DSS to apply for special assistance Medicaid.  No events overnight.   Assessment & Plan    Assessment and Plan:  Acute Metabolic Encephalopathy on chronic progressive early onset Dementia: Nonverbal/noninteractive.   Seen by neurology and psychiatry.   Extensive workup has been negative.  At this point there is no expectation of improvement.   Scheduled medication-Haldol 2 mg twice daily, Remeron 15 mg at bedtime As needed medications-Atarax, Ativan, Zyprexa Patient had a fall on 6/21.  CT head was unremarkable. No complaints today.    Bipolar Disorder Psych meds as above.   Vitamin B12 Deficiency:   Supplements ordered.    Essential hypertension  Not on any scheduled medications at this time.    Optimal BP parameters.    CKD Stage IIIa Stable.   Tobacco Abuse Resolved.    Sinus Tachycardia Noted occasionally.  Patient became bradycardic after she was started on metoprolol which was subsequently stopped.  Echocardiogram was unremarkable.  TSH was normal.   Constipation/altered GI function Bowel regimen. BM yesterday.     Malnutrition Type:  Nutrition Problem: Altered GI function Etiology: constipation   Malnutrition Characteristics:  Signs/Symptoms: other (comment) (last BM 10 days ago)   Nutrition Interventions:  Interventions: Other (Comment) (scheduled bowel regimen per MD)  Estimated body mass index is 18.18 kg/m as calculated from the following:   Height as of this encounter: 5\' 6"  (1.676 m).   Weight as of this encounter: 51.1 kg.  Code Status: full code.  DVT Prophylaxis:  enoxaparin (LOVENOX) injection 40 mg Start: 12/26/21 1200   Level of Care: Level of care: Med-Surg Family Communication: none at bedside.  Disposition Plan:     Remains inpatient appropriate:  awaiting placement.   Procedures:  None.   Consultants:   Psychiatry.   Antimicrobials:   Anti-infectives (From admission, onward)    None        Medications  Scheduled Meds:  vitamin B-12  500 mcg Oral Daily   enoxaparin (LOVENOX) injection  40 mg Subcutaneous Q24H   haloperidol  2 mg Oral BID   hydrocerin   Topical BID   mirtazapine  15 mg Oral QHS   multivitamin with minerals  1 tablet Oral Daily   polyethylene glycol  17 g Oral Daily   senna-docusate  2 tablet Oral QHS   Continuous Infusions:  sodium chloride     PRN Meds:.acetaminophen, alum & mag hydroxide-simeth, bisacodyl, fluticasone, hydrALAZINE, hydrOXYzine, ipratropium-albuterol, LORazepam, magnesium hydroxide, metoprolol tartrate, OLANZapine **OR** OLANZapine, ondansetron, mouth rinse    Subjective:   Natalie Brown was seen and examined today.  No new complaints.   Objective:   Vitals:    10/20/22 1900 10/20/22 2043 10/21/22 1006 10/21/22 1008  BP:  (!) 81/45 104/70   Pulse:  (!) 53 (!) 111 (!) 101  Resp:  14 18   Temp: (P) 97.8 F (36.6 C) 97.8 F (36.6 C) (!) 97.5 F (36.4 C)   TempSrc:  Oral    SpO2:  100% 100%   Weight:      Height:        Intake/Output Summary (Last 24 hours) at 10/21/2022 1212 Last data filed at 10/21/2022 1010 Gross per 24 hour  Intake 240 ml  Output --  Net 240 ml    Filed Weights   10/08/22 0812 10/08/22 1442 10/17/22 0307  Weight: 51.6 kg 50.9 kg 51.1 kg     Exam General exam: Appears calm and comfortable  Respiratory system: Clear to auscultation. Respiratory effort normal. Cardiovascular system: S1 & S2 heard, RRR.  Gastrointestinal system: Abdomen is nondistended, soft and nontender.  Central nervous system: Alert and comfortable.  Extremities: Symmetric 5 x 5 power. Skin: No rashes,         Data Reviewed:  I have personally reviewed following labs and imaging studies   CBC Lab Results  Component Value Date   WBC 4.5 10/11/2022   RBC 4.29 10/11/2022   HGB 12.3 10/11/2022   HCT 36.7 10/11/2022   MCV 85.5 10/11/2022   MCH 28.7 10/11/2022   PLT 244 10/11/2022   MCHC 33.5 10/11/2022   RDW 14.1 10/11/2022   LYMPHSABS 1.4 09/04/2022   MONOABS 0.3 09/04/2022   EOSABS 0.1 09/04/2022   BASOSABS 0.0 09/04/2022     Last metabolic panel Lab Results  Component Value Date   NA 141 10/11/2022   K 3.8 10/11/2022   CL 110 10/11/2022   CO2 24 10/11/2022   BUN 20 10/11/2022   CREATININE 1.18 (H) 10/11/2022   GLUCOSE 84 10/11/2022   GFRNONAA 55 (L) 10/11/2022   GFRAA 65 09/19/2017   CALCIUM 8.8 (L) 10/11/2022   PHOS 3.5 09/04/2022   PROT 6.8 09/04/2022   ALBUMIN 3.6 09/04/2022   LABGLOB 2.8 06/21/2020   AGRATIO 1.5 06/21/2020   BILITOT 0.4 09/04/2022   ALKPHOS 56 09/04/2022   AST 37 09/04/2022   ALT 23 09/04/2022   ANIONGAP 7 10/11/2022    CBG (last 3)  No results for input(s): "GLUCAP" in the last 72  hours.    Coagulation Profile: No results for input(s): "INR", "PROTIME" in the last 168 hours.   Radiology Studies: No results found.     Kathlen Mody M.D. [facility] 10/21/2022, 12:12 PM  Available via Epic secure chat 7am-7pm After 7 pm, please refer to night coverage provider listed on amion.

## 2022-10-22 MED ORDER — ENSURE ENLIVE PO LIQD
237.0000 mL | ORAL | Status: DC
  Administered 2022-10-22 – 2022-11-12 (×19): 237 mL via ORAL

## 2022-10-22 NOTE — Progress Notes (Signed)
[facility]                                                                               Natalie Brown, is a 57 y.o. female, DOB - 1965-05-31, FIE:[id number] Admit date - 12/24/2021    Outpatient Primary MD for the patient is [name], [name], NP  LOS - 300  days    Brief summary   57 year old with a history of bipolar disorder and chronic dementia with advanced cerebral atrophy who was admitted to the acute units 12/24/2021 following a preceding admission at [facility] 09/06/2021-12/24/2021. She was initially admitted to [facility] with acute mania and aggressive behaviors. On 12/24/2021 she became nearly catatonic and was transferred to the ER and ultimately admitted. During her acute hospital stay she has been evaluated by Neurology and Psychiatry. EEG has revealed no evidence of seizure activity. Repeat brain imaging was unremarkable. It was ultimately determined that her AMS was due to due progression of her severe underlying dementia.   The patient has been deemed medically stable and is awaiting a disposition. The goal is to place her within the memory care unit but this is proving difficult as she is homeless and has no insurance. Per TOC patient's mother plans to go to DSS to apply for special assistance Medicaid.  No events this week.   Assessment & Plan    Assessment and Plan:  Acute Metabolic Encephalopathy on chronic progressive early onset Dementia: Nonverbal/noninteractive.   Seen by neurology and psychiatry.   Extensive workup has been negative.  At this point there is no expectation of improvement.   Scheduled medication-Haldol 2 mg twice daily, Remeron 15 mg at bedtime As needed medications-Atarax, Ativan, Zyprexa Patient had a fall on 6/21.  CT head was unremarkable. No new complaints.    Bipolar Disorder Psych meds as above.   Vitamin B12 Deficiency:   Supplements ordered.    Essential hypertension  Not on any scheduled medications at this time.    Optimal BP parameters.    CKD Stage IIIa Stable.   Tobacco Abuse Resolved.    Sinus Tachycardia Noted occasionally.  Patient became bradycardic after she was started on metoprolol which was subsequently stopped.  Echocardiogram was unremarkable.  TSH was normal.   Constipation/altered GI function On Bowel regimen.   Malnutrition Type:  Nutrition Problem: Altered GI function Etiology: constipation   Malnutrition Characteristics:  Signs/Symptoms: other (comment) (last BM 10 days ago)   Nutrition Interventions:  Interventions: Other (Comment) (scheduled bowel regimen per MD)  Estimated body mass index is 18.18 kg/m as calculated from the following:   Height as of this encounter: 5\' 6"  (1.676 m).   Weight as of this encounter: 51.1 kg.  Code Status: full code.  DVT Prophylaxis:  enoxaparin (LOVENOX) injection 40 mg Start: 12/26/21 1200   Level of Care: Level of care: Med-Surg Family Communication: none at bedside.  Disposition Plan:     Remains inpatient appropriate:  awaiting placement.   Procedures:  None.   Consultants:   Psychiatry.   Antimicrobials:   Anti-infectives (From admission, onward)    None        Medications  Scheduled Meds:  vitamin  B-12  500 mcg Oral Daily   enoxaparin (LOVENOX) injection  40 mg Subcutaneous Q24H   haloperidol  2 mg Oral BID   hydrocerin   Topical BID   mirtazapine  15 mg Oral QHS   multivitamin with minerals  1 tablet Oral Daily   polyethylene glycol  17 g Oral Daily   senna-docusate  2 tablet Oral QHS   Continuous Infusions:  sodium chloride     PRN Meds:.acetaminophen, alum & mag hydroxide-simeth, bisacodyl, fluticasone, hydrALAZINE, hydrOXYzine, ipratropium-albuterol, LORazepam, magnesium hydroxide, metoprolol tartrate, OLANZapine **OR** OLANZapine, ondansetron, mouth rinse    Subjective:   Natalie Brown was seen and examined today.  No new complaints.   Objective:   Vitals:   10/21/22 1008  10/21/22 1728 10/21/22 2233 10/22/22 0525  BP:  92/66 (!) 88/60 129/86  Pulse: (!) 101 (!) 123 74 (!) 108  Resp:  18 18 18   Temp:  97.9 F (36.6 C) 98.1 F (36.7 C) 98.1 F (36.7 C)  TempSrc:      SpO2:  100% 99% 92%  Weight:      Height:        Intake/Output Summary (Last 24 hours) at 10/22/2022 1539 Last data filed at 10/22/2022 1000 Gross per 24 hour  Intake 720 ml  Output --  Net 720 ml    Filed Weights   10/08/22 0812 10/08/22 1442 10/17/22 0307  Weight: 51.6 kg 50.9 kg 51.1 kg     Exam General exam: Appears calm and comfortable  Respiratory system: Clear to auscultation. Respiratory effort normal. Cardiovascular system: S1 & S2 heard, RRR. No JVD,  Gastrointestinal system: Abdomen is nondistended, soft and nontender.  Central nervous system: Alert and comfortable.  Extremities: no edema. Skin: No rashes,           Data Reviewed:  I have personally reviewed following labs and imaging studies   CBC Lab Results  Component Value Date   WBC 4.5 10/11/2022   RBC 4.29 10/11/2022   HGB 12.3 10/11/2022   HCT 36.7 10/11/2022   MCV 85.5 10/11/2022   MCH 28.7 10/11/2022   PLT 244 10/11/2022   MCHC 33.5 10/11/2022   RDW 14.1 10/11/2022   LYMPHSABS 1.4 09/04/2022   MONOABS 0.3 09/04/2022   EOSABS 0.1 09/04/2022   BASOSABS 0.0 09/04/2022     Last metabolic panel Lab Results  Component Value Date   NA 141 10/11/2022   K 3.8 10/11/2022   CL 110 10/11/2022   CO2 24 10/11/2022   BUN 20 10/11/2022   CREATININE 1.18 (H) 10/11/2022   GLUCOSE 84 10/11/2022   GFRNONAA 55 (L) 10/11/2022   GFRAA 65 09/19/2017   CALCIUM 8.8 (L) 10/11/2022   PHOS 3.5 09/04/2022   PROT 6.8 09/04/2022   ALBUMIN 3.6 09/04/2022   LABGLOB 2.8 06/21/2020   AGRATIO 1.5 06/21/2020   BILITOT 0.4 09/04/2022   ALKPHOS 56 09/04/2022   AST 37 09/04/2022   ALT 23 09/04/2022   ANIONGAP 7 10/11/2022    CBG (last 3)  No results for input(s): "GLUCAP" in the last 72 hours.     Coagulation Profile: No results for input(s): "INR", "PROTIME" in the last 168 hours.   Radiology Studies: No results found.     Kathlen Mody M.D. [facility] 10/22/2022, 3:39 PM  Available via Epic secure chat 7am-7pm After 7 pm, please refer to night coverage provider listed on amion.

## 2022-10-22 NOTE — Progress Notes (Signed)
Nutrition Follow-up  DOCUMENTATION CODES:   Not applicable  INTERVENTION:  - Add Ensure q day.   NUTRITION DIAGNOSIS:   Altered GI function related to constipation as evidenced by other (comment) (last BM 10 days ago).  GOAL:   Other (Comment) (more frequent bowel movements)  MONITOR:   I & O's  REASON FOR ASSESSMENT:   LOS    ASSESSMENT:   57 y.o. female admits related to AMS. PMH includes: Bipolar disorde rand HTN. Pt is currently receiving medical management related to early onset dementia with behavioral disturbances.  Meds reviewed:  Vit B12, remeron, miralax, senokot. Labs reviewed: creatinine elevated.   The pt ate 80% of her breakfast and was working on her lunch at time of assessment. Per record, pt's intakes have mostly been around 50%. Pt's weights have been trending down since admission. RD will add Ensure Enlive once daily and see how well patient tolerates protein shakes. The sitter reports that she has been doing well with beverages. RD will continue to monitor PO intakes.   Diet Order:   Diet Order             DIET DYS 3 Room service appropriate? Yes; Fluid consistency: Thin  Diet effective now                   EDUCATION NEEDS:   Not appropriate for education at this time  Skin:  Skin Assessment: Reviewed RN Assessment  Last BM:  7/2 - type 6  Height:   Ht Readings from Last 1 Encounters:  02/03/22 5\' 6"  (1.676 m)    Weight:   Wt Readings from Last 1 Encounters:  10/17/22 51.1 kg    Ideal Body Weight:     BMI:  Body mass index is 18.18 kg/m.  Estimated Nutritional Needs:   Kcal:  1800-2160 kcals  Protein:  90-105 gm  Fluid:  >/= 1.8 L  Bethann Humble, RD, LDN, CNSC.

## 2022-10-23 DIAGNOSIS — I10 Essential (primary) hypertension: Secondary | ICD-10-CM

## 2022-10-23 DIAGNOSIS — R000 Tachycardia, unspecified: Secondary | ICD-10-CM

## 2022-10-23 NOTE — Progress Notes (Signed)
[facility]                                                                               Natalie Brown, is a 57 y.o. female, DOB - Dec 28, 1965, WUJ:[id number] Admit date - 12/24/2021    Outpatient Primary MD for the patient is [name], [name], NP  LOS - 301  days    Brief summary   57 year old with a history of bipolar disorder and chronic dementia with advanced cerebral atrophy who was admitted to the acute units 12/24/2021 following a preceding admission at [facility] 09/06/2021-12/24/2021. She was initially admitted to [facility] with acute mania and aggressive behaviors. On 12/24/2021 she became nearly catatonic and was transferred to the ER and ultimately admitted. During her acute hospital stay she has been evaluated by Neurology and Psychiatry. EEG has revealed no evidence of seizure activity. Repeat brain imaging was unremarkable. It was ultimately determined that her AMS was due to due progression of her severe underlying dementia.  The patient has been deemed medically stable and is awaiting a disposition. The goal is to place her within the memory care unit but this is proving difficult as she is homeless and has no insurance. Per TOC patient's mother plans to go to DSS to apply for special assistance Medicaid.  No events this week.   Assessment & Plan  Acute Metabolic Encephalopathy on chronic progressive early onset Dementia: Nonverbal/noninteractive.   Seen by neurology and psychiatry.   Extensive workup has been negative.  At this point there is no expectation of improvement.   Scheduled medication-Haldol 2 mg twice daily, Remeron 15 mg at bedtime As needed medications-Atarax, Ativan, Zyprexa Patient had a fall on 6/21.  CT head was unremarkable. No new complaints.    Bipolar Disorder Psych meds as above.   Vitamin B12 Deficiency:   Supplements ordered.    Essential hypertension  Not on any scheduled medications at this time.   Optimal BP parameters.     CKD Stage IIIa Stable.   Tobacco Abuse Resolved.    Sinus Tachycardia Noted occasionally.  Patient became bradycardic after she was started on metoprolol which was subsequently stopped.  Echocardiogram was unremarkable.  TSH was normal.   Constipation/altered GI function On Bowel regimen.  Nutrition Status: Nutrition Problem: Altered GI function Etiology: constipation Signs/Symptoms: other (comment) (last BM 10 days ago) Interventions: Other (Comment) (scheduled bowel regimen per MD)   Code Status: full code.  DVT Prophylaxis:  enoxaparin (LOVENOX) injection 40 mg Start: 12/26/21 1200   Level of Care: Level of care: Med-Surg Family Communication: none at bedside.  Disposition Plan:     Remains inpatient appropriate:  awaiting placement.   Procedures:  None.   Consultants:   Psychiatry.   Antimicrobials:   Anti-infectives (From admission, onward)    None        Medications  Scheduled Meds:  vitamin B-12  500 mcg Oral Daily   enoxaparin (LOVENOX) injection  40 mg Subcutaneous Q24H   feeding supplement  237 mL Oral Q24H   haloperidol  2 mg Oral BID   hydrocerin   Topical BID   mirtazapine  15 mg Oral QHS   multivitamin  with minerals  1 tablet Oral Daily   polyethylene glycol  17 g Oral Daily   senna-docusate  2 tablet Oral QHS   Continuous Infusions:  sodium chloride     PRN Meds:.acetaminophen, alum & mag hydroxide-simeth, bisacodyl, fluticasone, hydrALAZINE, hydrOXYzine, ipratropium-albuterol, LORazepam, magnesium hydroxide, metoprolol tartrate, OLANZapine **OR** OLANZapine, ondansetron, mouth rinse    Subjective:   Natalie Brown was seen and examined today.  No major events overnight of this morning.  Feeding herself  Objective:   Vitals:   10/21/22 2233 10/22/22 0525 10/22/22 1644 10/23/22 0647  BP: (!) 88/60 129/86 (!) 143/79 110/62  Pulse: 74 (!) 108 (!) 104 (!) 102  Resp: 18 18 18 16   Temp: 98.1 F (36.7 C) 98.1 F (36.7 C)  98.5 F  (36.9 C)  TempSrc:    Oral  SpO2: 99% 92% 100% 100%  Weight:    50.3 kg  Height:        Intake/Output Summary (Last 24 hours) at 10/23/2022 1333 Last data filed at 10/22/2022 1711 Gross per 24 hour  Intake 240 ml  Output --  Net 240 ml   Filed Weights   10/08/22 1442 10/17/22 0307 10/23/22 0647  Weight: 50.9 kg 51.1 kg 50.3 kg   GENERAL: No apparent distress.  Nontoxic. HEENT: MMM.  Vision and hearing grossly intact.  NECK: Supple.  No apparent JVD.  RESP:  No IWOB.  Fair aeration bilaterally. CVS:  RRR. Heart sounds normal.  ABD/GI/GU: BS+. Abd soft, NTND.  MSK/EXT:   No apparent deformity. Moves extremities. No edema.  SKIN: no apparent skin lesion or wound NEURO: Awake and alert.  Not oriented.  Does not follow command. PSYCH: Calm.  No distress or agitation.  Data Reviewed:  I have personally reviewed following labs and imaging studies   CBC Lab Results  Component Value Date   WBC 4.5 10/11/2022   RBC 4.29 10/11/2022   HGB 12.3 10/11/2022   HCT 36.7 10/11/2022   MCV 85.5 10/11/2022   MCH 28.7 10/11/2022   PLT 244 10/11/2022   MCHC 33.5 10/11/2022   RDW 14.1 10/11/2022   LYMPHSABS 1.4 09/04/2022   MONOABS 0.3 09/04/2022   EOSABS 0.1 09/04/2022   BASOSABS 0.0 09/04/2022     Last metabolic panel Lab Results  Component Value Date   NA 141 10/11/2022   K 3.8 10/11/2022   CL 110 10/11/2022   CO2 24 10/11/2022   BUN 20 10/11/2022   CREATININE 1.18 (H) 10/11/2022   GLUCOSE 84 10/11/2022   GFRNONAA 55 (L) 10/11/2022   GFRAA 65 09/19/2017   CALCIUM 8.8 (L) 10/11/2022   PHOS 3.5 09/04/2022   PROT 6.8 09/04/2022   ALBUMIN 3.6 09/04/2022   LABGLOB 2.8 06/21/2020   AGRATIO 1.5 06/21/2020   BILITOT 0.4 09/04/2022   ALKPHOS 56 09/04/2022   AST 37 09/04/2022   ALT 23 09/04/2022   ANIONGAP 7 10/11/2022    CBG (last 3)  No results for input(s): "GLUCAP" in the last 72 hours.    Coagulation Profile: No results for input(s): "INR", "PROTIME" in the last  168 hours.   Radiology Studies: No results found.     Almon Hercules M.D. [facility] 10/23/2022, 1:33 PM  Available via Epic secure chat 7am-7pm After 7 pm, please refer to night coverage provider listed on amion.

## 2022-10-24 NOTE — Progress Notes (Signed)
[facility]                                                                               Natalie Brown, is a 57 y.o. female, DOB - February 11, 1966, ZOX:[id number] Admit date - 12/24/2021    Outpatient Primary MD for the patient is [name], [name], NP  LOS - 301  days    Brief summary   57 year old with a history of bipolar disorder and chronic dementia with advanced cerebral atrophy who was admitted to the acute units 12/24/2021 following a preceding admission at [facility] 09/06/2021-12/24/2021. She was initially admitted to [facility] with acute mania and aggressive behaviors. On 12/24/2021 she became nearly catatonic and was transferred to the ER and ultimately admitted. During her acute hospital stay she has been evaluated by Neurology and Psychiatry. EEG has revealed no evidence of seizure activity. Repeat brain imaging was unremarkable. It was ultimately determined that her AMS was due to due progression of her severe underlying dementia.  The patient has been deemed medically stable and is awaiting a disposition. The goal is to place her within the memory care unit but this is proving difficult as she is homeless and has no insurance. Per TOC patient's mother plans to go to DSS to apply for special assistance Medicaid.  No events this week.   Assessment & Plan  Acute Metabolic Encephalopathy on chronic progressive early onset Dementia: Nonverbal/noninteractive.   Seen by neurology and psychiatry.   Extensive workup has been negative.  At this point there is no expectation of improvement.   Scheduled medication-Haldol 2 mg twice daily, Remeron 15 mg at bedtime As needed medications-Atarax, Ativan, Zyprexa Patient had a fall on 6/21.  CT head was unremarkable. No new complaints.    Bipolar Disorder Psych meds as above.   Vitamin B12 Deficiency:   Supplements ordered.    Essential hypertension  Not on any scheduled medications at this time.   Optimal BP parameters.     CKD Stage IIIa Stable.   Tobacco Abuse Resolved.    Sinus Tachycardia Noted occasionally.  Patient became bradycardic after she was started on metoprolol which was subsequently stopped.  Echocardiogram was unremarkable.  TSH was normal.   Constipation/altered GI function On Bowel regimen.  Nutrition Status: Nutrition Problem: Altered GI function Etiology: constipation Signs/Symptoms: other (comment) (last BM 10 days ago) Interventions: Other (Comment) (scheduled bowel regimen per MD)   Code Status: full code.  DVT Prophylaxis:  enoxaparin (LOVENOX) injection 40 mg Start: 12/26/21 1200   Level of Care: Level of care: Med-Surg Family Communication: none at bedside.  Disposition Plan:     Remains inpatient appropriate:  awaiting placement.   Procedures:  None.   Consultants:   Psychiatry.   Antimicrobials:   Anti-infectives (From admission, onward)    None        Medications  Scheduled Meds:  vitamin B-12  500 mcg Oral Daily   enoxaparin (LOVENOX) injection  40 mg Subcutaneous Q24H   feeding supplement  237 mL Oral Q24H   haloperidol  2 mg Oral BID   hydrocerin   Topical BID   mirtazapine  15 mg Oral QHS   multivitamin  with minerals  1 tablet Oral Daily   polyethylene glycol  17 g Oral Daily   senna-docusate  2 tablet Oral QHS   Continuous Infusions:  sodium chloride     PRN Meds:.acetaminophen, alum & mag hydroxide-simeth, bisacodyl, fluticasone, hydrALAZINE, hydrOXYzine, ipratropium-albuterol, LORazepam, magnesium hydroxide, metoprolol tartrate, OLANZapine **OR** OLANZapine, ondansetron, mouth rinse    Subjective:   Natalie Brown was seen and examined today.  No major events overnight of this morning.   Objective:   Vitals:   10/21/22 2233 10/22/22 0525 10/22/22 1644 10/23/22 0647  BP: (!) 88/60 129/86 (!) 143/79 110/62  Pulse: 74 (!) 108 (!) 104 (!) 102  Resp: 18 18 18 16   Temp: 98.1 F (36.7 C) 98.1 F (36.7 C)  98.5 F (36.9 C)   TempSrc:    Oral  SpO2: 99% 92% 100% 100%  Weight:    50.3 kg  Height:        Intake/Output Summary (Last 24 hours) at 10/23/2022 1333 Last data filed at 10/22/2022 1711 Gross per 24 hour  Intake 240 ml  Output --  Net 240 ml   Filed Weights   10/08/22 1442 10/17/22 0307 10/23/22 0647  Weight: 50.9 kg 51.1 kg 50.3 kg   GENERAL: No apparent distress.  Nontoxic. HEENT: MMM.  Vision and hearing grossly intact.  NECK: Supple.  No apparent JVD.  RESP:  No IWOB.  Fair aeration bilaterally. CVS:  RRR. Heart sounds normal.  ABD/GI/GU: BS+. Abd soft, NTND.  MSK/EXT:   No apparent deformity. Moves extremities. No edema.  SKIN: no apparent skin lesion or wound NEURO: Awake and alert.  Not oriented.  Does not follow command. PSYCH: Calm.  No distress or agitation.  Data Reviewed:  I have personally reviewed following labs and imaging studies   CBC Lab Results  Component Value Date   WBC 4.5 10/11/2022   RBC 4.29 10/11/2022   HGB 12.3 10/11/2022   HCT 36.7 10/11/2022   MCV 85.5 10/11/2022   MCH 28.7 10/11/2022   PLT 244 10/11/2022   MCHC 33.5 10/11/2022   RDW 14.1 10/11/2022   LYMPHSABS 1.4 09/04/2022   MONOABS 0.3 09/04/2022   EOSABS 0.1 09/04/2022   BASOSABS 0.0 09/04/2022     Last metabolic panel Lab Results  Component Value Date   NA 141 10/11/2022   K 3.8 10/11/2022   CL 110 10/11/2022   CO2 24 10/11/2022   BUN 20 10/11/2022   CREATININE 1.18 (H) 10/11/2022   GLUCOSE 84 10/11/2022   GFRNONAA 55 (L) 10/11/2022   GFRAA 65 09/19/2017   CALCIUM 8.8 (L) 10/11/2022   PHOS 3.5 09/04/2022   PROT 6.8 09/04/2022   ALBUMIN 3.6 09/04/2022   LABGLOB 2.8 06/21/2020   AGRATIO 1.5 06/21/2020   BILITOT 0.4 09/04/2022   ALKPHOS 56 09/04/2022   AST 37 09/04/2022   ALT 23 09/04/2022   ANIONGAP 7 10/11/2022    CBG (last 3)  No results for input(s): "GLUCAP" in the last 72 hours.    Coagulation Profile: No results for input(s): "INR", "PROTIME" in the last 168  hours.   Radiology Studies: No results found.     Almon Hercules M.D. [facility] 10/23/2022, 1:33 PM  Available via Epic secure chat 7am-7pm After 7 pm, please refer to night coverage provider listed on amion.

## 2022-10-25 NOTE — TOC Progression Note (Signed)
Transition of Care [facility]) - Progression Note    Patient Details  Name: Natalie Brown MRN: [id number] Date of Birth: May 30, 1965  Transition of Care [facility]) CM/SW Contact  Inis Sizer, LCSW Phone Number: 10/25/2022, 10:37 AM  Clinical Narrative:    There are no discharge planning updates available at this time. CSW has been unable to obtain any bed offers from any facilities due to patient's age and mental health history.    Barriers to Discharge: Financial Resources, Inadequate or no insurance, Homeless with medical needs  Expected Discharge Plan and Services In-house Referral: Clinical Social Work   Post Acute Care Choice: Nursing Home Living arrangements for the past 2 months: Homeless [facility]) Expected Discharge Date: 12/24/21                                     Social Determinants of Health (SDOH) Interventions SDOH Screenings   Food Insecurity: No Food Insecurity (03/05/2022)  Housing: High Risk (03/21/2022)  Alcohol Screen: Low Risk  (09/06/2021)  Depression (PHQ2-9): Medium Risk (05/26/2020)  Tobacco Use: High Risk (03/21/2022)    Readmission Risk Interventions     No data to display

## 2022-10-25 NOTE — Progress Notes (Signed)
PROGRESS NOTE  Natalie Brown:[id number] DOB: [birth year]   PCP: Natalie Sharps, NP  Patient is from: Home  DOA: 12/24/2021 LOS: 303  Chief complaints No chief complaint on file.    Brief Narrative / Interim history: 57 year old with a history of bipolar disorder and chronic dementia with advanced cerebral atrophy who was admitted to the acute units 12/24/2021 following a preceding admission at [facility] 09/06/2021-12/24/2021. She was initially admitted to [facility] with acute mania and aggressive behaviors. On 12/24/2021 she became nearly catatonic and was transferred to the ER and ultimately admitted. During her acute hospital stay she has been evaluated by Neurology and Psychiatry. EEG has revealed no evidence of seizure activity. Repeat brain imaging was unremarkable. It was ultimately determined that her AMS was due to due progression of her severe underlying dementia.  The patient has been deemed medically stable and is awaiting a disposition. The goal is to place her within the memory care unit but this is proving difficult as she is homeless and has no insurance. Per TOC patient's mother plans to go to DSS to apply for special assistance Medicaid.  No events this week.    Subjective: Seen and examined earlier this morning.  No major events overnight of this morning. 19.  Recent Lasix down on the floor.  Not talking or engaging.  Objective: Vitals:   10/24/22 0500 10/24/22 0601 10/24/22 1212 10/24/22 2212  BP:  (!) 132/105 (!) 92/57 96/64  Pulse:  96  (!) 58  Resp:  16 18 18   Temp:  98.1 F (36.7 C) 98.2 F (36.8 C) 98 F (36.7 C)  TempSrc:  Oral Oral   SpO2:  100% 100% 100%  Weight: 50.4 kg     Height:        Examination:  GENERAL: No apparent distress.  Lying in bed with legs on the floor. HEENT: MMM.  Vision and hearing grossly intact.  NECK: Supple.  No apparent JVD.  RESP:  No IWOB.  Fair aeration bilaterally. CVS:  RRR. Heart sounds normal.  ABD/GI/GU: BS+.  Abd soft, NTND.  MSK/EXT:  Moves extremities. No apparent deformity. No edema.  SKIN: no apparent skin lesion or wound NEURO: Awake.  Not talking or engaging.  Moves all extremities slowly. PSYCH: Calm. Normal affect.    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   Seen by neurology and psychiatry.   Extensive workup has been negative.  At this point there is no expectation of improvement.   Scheduled medication-Haldol 2 mg twice daily, Remeron 15 mg at bedtime As needed medications-Atarax, Ativan, Zyprexa Patient had a fall on 6/21.  CT head was unremarkable. No new complaints.    Bipolar Disorder Psych meds as above.   Vitamin B12 Deficiency:   Supplements ordered.    Essential hypertension  Not on any scheduled medications at this time.   Optimal BP parameters.    CKD Stage IIIa Stable.   Tobacco Abuse Resolved.    Sinus Tachycardia Noted occasionally.  Patient became bradycardic after she was started on metoprolol which was subsequently stopped.  Echocardiogram was unremarkable.  TSH was normal.   Constipation/altered GI function On Bowel regimen.   Nutrition Status: Body mass index is 17.93 kg/m. Nutrition Problem: Altered GI function Etiology: constipation Signs/Symptoms: other (comment) (last BM 10 days ago) Interventions: Other (Comment) (scheduled bowel regimen per MD)   DVT prophylaxis:  enoxaparin (LOVENOX) injection 40 mg Start: 12/26/21 1200  Code Status: Full code Family Communication: None at  bedside Level of care: Med-Surg Status is: Inpatient Remains inpatient appropriate because: Due to lack of safe disposition   Final disposition: Unknown  25 minutes with more than 50% spent in reviewing records, counseling patient/family and coordinating care.   Sch Meds:  Scheduled Meds:  vitamin B-12  500 mcg Oral Daily   enoxaparin (LOVENOX) injection  40 mg Subcutaneous Q24H   feeding supplement  237 mL Oral Q24H   haloperidol  2 mg Oral BID   hydrocerin   Topical BID   mirtazapine  15 mg Oral QHS   multivitamin with minerals  1 tablet Oral Daily   polyethylene glycol  17 g Oral Daily   senna-docusate  2 tablet Oral QHS   Continuous Infusions:  sodium chloride     PRN Meds:.acetaminophen, alum & mag hydroxide-simeth, bisacodyl, fluticasone, hydrALAZINE, hydrOXYzine, ipratropium-albuterol, LORazepam, magnesium hydroxide, metoprolol tartrate, OLANZapine **OR** OLANZapine, ondansetron, mouth rinse  Antimicrobials: Anti-infectives (From admission, onward)    None        I have personally reviewed the following labs and images: CBC: No results for input(s): "WBC", "NEUTROABS", "HGB", "HCT", "MCV", "PLT" in the last 168 hours. BMP &GFR No results for input(s): "NA", "K", "CL", "CO2", "GLUCOSE", "BUN", "CREATININE", "CALCIUM", "MG", "PHOS" in the last 168 hours.  Invalid input(s): "GFRCG" Estimated Creatinine Clearance: 42.9 mL/min (A) (by C-G formula based on SCr of 1.18 mg/dL (H)). Liver & Pancreas: No results for input(s): "AST", "ALT", "ALKPHOS", "BILITOT", "PROT", "ALBUMIN" in the last 168 hours. No results for input(s): "LIPASE", "AMYLASE" in the last 168 hours. No results for input(s): "AMMONIA" in the last 168 hours. Diabetic: No results for input(s): "HGBA1C" in the last 72 hours. No results for input(s): "GLUCAP" in the last 168 hours. Cardiac Enzymes: No results for input(s): "CKTOTAL", "CKMB", "CKMBINDEX", "TROPONINI" in the last 168 hours. No results  for input(s): "PROBNP" in the last 8760 hours. Coagulation Profile: No results for input(s): "INR", "PROTIME" in the last 168 hours. Thyroid Function Tests: No results for input(s): "TSH", "T4TOTAL", "FREET4", "T3FREE", "THYROIDAB" in the last 72 hours. Lipid Profile: No results for input(s): "CHOL", "HDL", "LDLCALC", "TRIG", "CHOLHDL", "LDLDIRECT" in the last 72 hours. Anemia Panel: No results for input(s): "VITAMINB12", "FOLATE", "FERRITIN", "TIBC", "IRON", "RETICCTPCT" in the last 72 hours. Urine analysis:    Component Value Date/Time   COLORURINE YELLOW 06/28/2022 0630   APPEARANCEUR CLEAR 06/28/2022 0630   LABSPEC 1.021 06/28/2022 0630   PHURINE 5.0 06/28/2022 0630   GLUCOSEU NEGATIVE 06/28/2022 0630   HGBUR NEGATIVE 06/28/2022 0630   BILIRUBINUR NEGATIVE 06/28/2022 0630   BILIRUBINUR neg 08/07/2016 1526   KETONESUR NEGATIVE 06/28/2022 0630   PROTEINUR NEGATIVE 06/28/2022 0630   UROBILINOGEN 2.0 (A) 08/07/2016 1526   UROBILINOGEN 1 12/30/2013 1216   NITRITE NEGATIVE 06/28/2022 0630   LEUKOCYTESUR NEGATIVE 06/28/2022 0630   Sepsis Labs: Invalid input(s): "PROCALCITONIN", "LACTICIDVEN"  Microbiology: No results found for this or any previous visit (from the past 240 hour(s)).  Radiology Studies: No results found.    Blaire Palomino T. Tabia Landowski [facility]  If 7PM-7AM, please contact night-coverage [email or web address] 10/25/2022, 11:20  AM

## 2022-10-25 NOTE — Plan of Care (Signed)

## 2022-10-26 NOTE — Progress Notes (Signed)
PROGRESS NOTE  Natalie Brown:[id number] DOB: 05/28/1965   PCP: Lavinia Sharps, NP  Patient is from: Home  DOA: 12/24/2021 LOS: 304  Chief complaints No chief complaint on file.    Brief Narrative / Interim history: 57 year old with a history of bipolar disorder and chronic dementia with advanced cerebral atrophy who was admitted to the acute units 12/24/2021 following a preceding admission at [facility] 09/06/2021-12/24/2021. She was initially admitted to [facility] with acute mania and aggressive behaviors. On 12/24/2021 she became nearly catatonic and was transferred to the ER and ultimately admitted. During her acute hospital stay she has been evaluated by Neurology and Psychiatry. EEG has revealed no evidence of seizure activity. Repeat brain imaging was unremarkable. It was ultimately determined that her AMS was due to due progression of her severe underlying dementia.  The patient has been deemed medically stable and is awaiting a disposition. The goal is to place her within the memory care unit but this is proving difficult as she is homeless and has no insurance. Per TOC patient's mother plans to go to DSS to apply for special assistance Medicaid.  No events this week.    Subjective: Seen and examined earlier this morning.  No major events overnight of this morning. Not talking or engaging.  Objective: Vitals:   10/24/22 2212 10/25/22 1959 10/26/22 0346 10/26/22 0500  BP: 96/64 117/64 119/85   Pulse: (!) 58  100   Resp: 18 18 16    Temp: 98 F (36.7 C)  98.4 F (36.9 C)   TempSrc:   Oral   SpO2: 100% 100% 100%   Weight:    49.6 kg  Height:        Examination:  GENERAL: No apparent distress.  Lying in bed with legs on the floor. HEENT: MMM.  Vision and hearing grossly intact.  NECK: Supple.  No apparent JVD.  RESP:  No IWOB.  Fair aeration bilaterally. CVS:  RRR. Heart sounds normal.  ABD/GI/GU: BS+. Abd soft, NTND.  MSK/EXT:  Moves extremities. No apparent  deformity. No edema.  SKIN: no apparent skin lesion or wound NEURO: Awake.  Not talking or engaging.  Moves all extremities slowly. PSYCH: Calm. Normal affect.    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   Seen by neurology and psychiatry.   Extensive workup has been negative.  At this point there is no expectation of improvement.   Scheduled medication-Haldol 2 mg twice daily, Remeron 15 mg at bedtime As needed medications-Atarax, Ativan, Zyprexa Patient had a fall on 6/21.  CT head was unremarkable. No new complaints.    Bipolar Disorder Psych meds as above.   Vitamin B12 Deficiency:   Supplements ordered.    Essential hypertension  Not on any scheduled medications at this time.   Optimal BP parameters.    CKD Stage IIIa Stable.   Tobacco Abuse Resolved.    Sinus Tachycardia Noted occasionally.  Patient became bradycardic after she was started on metoprolol which was subsequently stopped.  Echocardiogram was unremarkable.  TSH was normal.   Constipation/altered GI function On Bowel  regimen.   Nutrition Status: Body mass index is 17.65 kg/m. Nutrition Problem: Altered GI function Etiology: constipation Signs/Symptoms: other (comment) (last BM 10 days ago) Interventions: Other (Comment) (scheduled bowel regimen per MD)   DVT prophylaxis:  enoxaparin (LOVENOX) injection 40 mg Start: 12/26/21 1200  Code Status: Full code Family Communication: None at bedside Level of care: Med-Surg Status is:  Inpatient Remains inpatient appropriate because: Due to lack of safe disposition   Final disposition: Unknown  25 minutes with more than 50% spent in reviewing records, counseling patient/family and coordinating care.   Sch Meds:  Scheduled Meds:  vitamin B-12  500 mcg Oral Daily   enoxaparin (LOVENOX) injection  40 mg Subcutaneous Q24H   feeding supplement  237 mL Oral Q24H   haloperidol  2 mg Oral BID   hydrocerin   Topical BID   mirtazapine  15 mg Oral QHS   multivitamin with minerals  1 tablet Oral Daily   polyethylene glycol  17 g Oral Daily   senna-docusate  2 tablet Oral QHS   Continuous Infusions:  sodium chloride     PRN Meds:.acetaminophen, alum & mag hydroxide-simeth, bisacodyl, fluticasone, hydrALAZINE, hydrOXYzine, ipratropium-albuterol, LORazepam, magnesium hydroxide, metoprolol tartrate, OLANZapine **OR** OLANZapine, ondansetron, mouth rinse  Antimicrobials: Anti-infectives (From admission, onward)    None        I have personally reviewed the following labs and images: CBC: No results for input(s): "WBC", "NEUTROABS", "HGB", "HCT", "MCV", "PLT" in the last 168 hours. BMP &GFR No results for input(s): "NA", "K", "CL", "CO2", "GLUCOSE", "BUN", "CREATININE", "CALCIUM", "MG", "PHOS" in the last 168 hours.  Invalid input(s): "GFRCG" Estimated Creatinine Clearance: 42.2 mL/min (A) (by C-G formula based on SCr of 1.18 mg/dL (H)). Liver & Pancreas: No results for input(s): "AST", "ALT", "ALKPHOS", "BILITOT", "PROT", "ALBUMIN" in the last 168 hours. No results for input(s): "LIPASE", "AMYLASE" in the last 168 hours. No results for input(s): "AMMONIA" in the last 168 hours. Diabetic: No results for input(s): "HGBA1C" in the last 72 hours. No results for input(s): "GLUCAP" in the last 168 hours. Cardiac Enzymes: No results for input(s): "CKTOTAL", "CKMB", "CKMBINDEX", "TROPONINI" in the last 168 hours. No results for input(s): "PROBNP" in the last 8760  hours. Coagulation Profile: No results for input(s): "INR", "PROTIME" in the last 168 hours. Thyroid Function Tests: No results for input(s): "TSH", "T4TOTAL", "FREET4", "T3FREE", "THYROIDAB" in the last 72 hours. Lipid Profile: No results for input(s): "CHOL", "HDL", "LDLCALC", "TRIG", "CHOLHDL", "LDLDIRECT" in the last 72 hours. Anemia Panel: No results for input(s): "VITAMINB12", "FOLATE", "FERRITIN", "TIBC", "IRON", "RETICCTPCT" in the last 72 hours. Urine analysis:    Component Value Date/Time   COLORURINE YELLOW 06/28/2022 0630   APPEARANCEUR CLEAR 06/28/2022 0630   LABSPEC 1.021 06/28/2022 0630   PHURINE 5.0 06/28/2022 0630   GLUCOSEU NEGATIVE 06/28/2022 0630   HGBUR NEGATIVE 06/28/2022 0630   BILIRUBINUR NEGATIVE 06/28/2022 0630   BILIRUBINUR neg 08/07/2016 1526   KETONESUR NEGATIVE 06/28/2022 0630   PROTEINUR NEGATIVE 06/28/2022 0630   UROBILINOGEN 2.0 (A) 08/07/2016 1526   UROBILINOGEN 1 12/30/2013 1216   NITRITE NEGATIVE 06/28/2022 0630   LEUKOCYTESUR NEGATIVE 06/28/2022 0630   Sepsis Labs: Invalid input(s): "PROCALCITONIN", "LACTICIDVEN"  Microbiology: No results found for this or any previous visit (from the past 240 hour(s)).  Radiology Studies: No results found.    Kem Hensen T. Kiev Labrosse [facility]  If 7PM-7AM, please contact night-coverage [email or web address] 10/26/2022, 10:25 AM

## 2022-10-26 NOTE — Plan of Care (Signed)

## 2022-10-26 NOTE — Progress Notes (Signed)
Pt too lethargic to give po meds at this time.

## 2022-10-27 MED ORDER — LACTATED RINGERS IV BOLUS: 1000.0000 mL | Freq: Once | INTRAVENOUS | Status: DC

## 2022-10-27 NOTE — Plan of Care (Signed)

## 2022-10-27 NOTE — Progress Notes (Signed)
PROGRESS NOTE  Natalie Brown AOZ:[id number] DOB: 1966-03-08   PCP: Lavinia Sharps, NP  Patient is from: Home  DOA: 12/24/2021 LOS: 305  Chief complaints No chief complaint on file.    Brief Narrative / Interim history: 57 year old with a history of bipolar disorder and chronic dementia with advanced cerebral atrophy who was admitted to the acute units 12/24/2021 following a preceding admission at [facility] 09/06/2021-12/24/2021. She was initially admitted to [facility] with acute mania and aggressive behaviors. On 12/24/2021 she became nearly catatonic and was transferred to the ER and ultimately admitted. During her acute hospital stay she has been evaluated by Neurology and Psychiatry. EEG has revealed no evidence of seizure activity. Repeat brain imaging was unremarkable. It was ultimately determined that her AMS was due to due progression of her severe underlying dementia.  The patient has been deemed medically stable and is awaiting a disposition. The goal is to place her within the memory care unit but this is proving difficult as she is homeless and has no insurance. Per TOC patient's mother plans to go to DSS to apply for special assistance Medicaid.  No events this week.    Subjective: Seen and examined earlier this morning.  No major events overnight of this morning.  Lying in bed.  [name] somewhat lethargic not doing much.  Seems to have blank stare.  Did not receive any other sedating medication other than scheduled Haldol.  Objective: Vitals:   10/26/22 0500 10/26/22 2000 10/27/22 0431 10/27/22 0802  BP:  124/71 112/68 105/75  Pulse:  100 96 (!) 105  Resp:  16 16 18   Temp:  97.7 F (36.5 C)  98.3 F (36.8 C)  TempSrc:  Oral  Oral  SpO2:  96% 92% 100%  Weight: 49.6 kg  49.9 kg   Height:        Examination:  GENERAL: No apparent distress.  Lying in bed with legs on the floor. HEENT: MMM.  Vision and hearing grossly intact.  NECK: Supple.  No apparent JVD.  RESP:  No  IWOB.  Fair aeration bilaterally. CVS:  RRR. Heart sounds normal.  ABD/GI/GU: BS+. Abd soft, NTND.  MSK/EXT:   No apparent deformity. No edema.  SKIN: no apparent skin lesion or wound NEURO: Awake but lethargic.  Lying in bed.  Not moving much.  Seems to have blank stare. PSYCH: No distress or agitation.   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  Somewhat lethargic Seen by neurology and psychiatry.   Extensive workup has been negative.  At this point there is no expectation of improvement.   Scheduled medication-Haldol 2 mg twice daily Discontinued as needed Atarax and Ativan.  Has not used them in days. Continue Zyprexa as needed Patient had a fall on 6/21.  CT head was unremarkable. Somewhat lethargic yesterday and today.  Check basic labs, ammonia and B12.  IV LR bolus   Bipolar Disorder Psych meds as above.   Vitamin B12 Deficiency:   Supplements ordered.    Essential hypertension  Not on any scheduled medications at this time.  Optimal BP parameters.    CKD Stage IIIa Stable.   Tobacco Abuse Resolved.    Sinus Tachycardia Noted occasionally.  Patient became bradycardic after she was started on metoprolol which was subsequently stopped.  Echocardiogram was unremarkable.  TSH was normal.   Constipation/altered GI function On Bowel regimen.   Nutrition Status: Body mass index is 17.76 kg/m. Nutrition Problem: Altered GI function Etiology: constipation Signs/Symptoms: other (comment) (last BM 10 days  ago) Interventions: Other (Comment) (scheduled bowel regimen per MD)   DVT prophylaxis:  enoxaparin (LOVENOX) injection 40 mg Start: 12/26/21 1200  Code Status: Full code Family Communication: None at bedside Level of care: Med-Surg Status is: Inpatient Remains inpatient appropriate because: Due to lack of safe disposition   Final disposition: Unknown  35 minutes with more than 50% spent in reviewing records, counseling patient/family and coordinating care.   Sch Meds:  Scheduled Meds:  vitamin B-12  500 mcg Oral Daily   enoxaparin (LOVENOX) injection  40 mg Subcutaneous Q24H   feeding supplement  237 mL Oral Q24H   haloperidol  2 mg Oral BID   hydrocerin   Topical BID   mirtazapine  15 mg Oral QHS   multivitamin with minerals  1 tablet Oral Daily   polyethylene glycol  17 g Oral Daily   senna-docusate  2 tablet Oral QHS   Continuous Infusions:  sodium chloride     PRN Meds:.acetaminophen, alum & mag hydroxide-simeth, bisacodyl, fluticasone, hydrALAZINE, hydrOXYzine, ipratropium-albuterol, LORazepam, magnesium hydroxide, metoprolol tartrate, OLANZapine **OR** OLANZapine, ondansetron, mouth rinse  Antimicrobials: Anti-infectives (From admission, onward)    None        I have personally reviewed the following labs and images: CBC: No results for input(s): "WBC", "NEUTROABS", "HGB", "HCT", "MCV", "PLT" in the last 168 hours. BMP &GFR No results for input(s): "NA", "K", "CL", "CO2", "GLUCOSE", "BUN", "CREATININE", "CALCIUM", "MG", "PHOS" in the last 168 hours.  Invalid input(s): "GFRCG" Estimated Creatinine Clearance: 42.4 mL/min (A) (by C-G formula based on SCr of 1.18 mg/dL (H)). Liver & Pancreas: No results for input(s): "AST", "ALT", "ALKPHOS", "BILITOT", "PROT", "ALBUMIN" in the last 168 hours. No results for input(s): "LIPASE", "AMYLASE" in the last 168 hours. No results for input(s): "AMMONIA" in the last 168 hours. Diabetic: No results for input(s):  "HGBA1C" in the last 72 hours. No results for input(s): "GLUCAP" in the last 168 hours. Cardiac Enzymes: No results for input(s): "CKTOTAL", "CKMB", "CKMBINDEX", "TROPONINI" in the last 168 hours. No results for input(s): "PROBNP" in the last 8760 hours. Coagulation Profile: No results for input(s): "INR", "PROTIME" in the last 168 hours. Thyroid Function Tests: No results for input(s): "TSH", "T4TOTAL", "FREET4", "T3FREE", "THYROIDAB" in the last 72 hours. Lipid Profile: No results for input(s): "CHOL", "HDL", "LDLCALC", "TRIG", "CHOLHDL", "LDLDIRECT" in the last 72 hours. Anemia Panel: No results for input(s): "VITAMINB12", "FOLATE", "FERRITIN", "TIBC", "IRON", "RETICCTPCT" in the last 72 hours. Urine analysis:    Component Value Date/Time   COLORURINE YELLOW 06/28/2022 0630   APPEARANCEUR CLEAR 06/28/2022 0630   LABSPEC 1.021 06/28/2022 0630   PHURINE 5.0 06/28/2022 0630   GLUCOSEU NEGATIVE 06/28/2022 0630   HGBUR NEGATIVE 06/28/2022 0630   BILIRUBINUR NEGATIVE 06/28/2022 0630   BILIRUBINUR neg 08/07/2016 1526   KETONESUR NEGATIVE 06/28/2022 0630   PROTEINUR NEGATIVE 06/28/2022 0630   UROBILINOGEN 2.0 (A) 08/07/2016 1526   UROBILINOGEN 1 12/30/2013 1216   NITRITE NEGATIVE 06/28/2022 0630   LEUKOCYTESUR NEGATIVE 06/28/2022 0630   Sepsis Labs: Invalid input(s): "PROCALCITONIN", "LACTICIDVEN"  Microbiology:  No results found for this or any previous visit (from the past 240 hour(s)).  Radiology Studies: No results found.    Issam Carlyon T. Aubre Quincy [facility]  If 7PM-7AM, please contact night-coverage [email or web address] 10/27/2022, 12:04 PM

## 2022-10-28 LAB — COMPREHENSIVE METABOLIC PANEL
ALT: 24 U/L (ref 0–44)
AST: 28 U/L (ref 15–41)
Albumin: 3.5 g/dL (ref 3.5–5.0)
Alkaline Phosphatase: 57 U/L (ref 38–126)
Anion gap: 12 (ref 5–15)
BUN: 19 mg/dL (ref 6–20)
CO2: 22 mmol/L (ref 22–32)
Calcium: 9.1 mg/dL (ref 8.9–10.3)
Chloride: 105 mmol/L (ref 98–111)
Creatinine, Ser: 1.31 mg/dL — ABNORMAL HIGH (ref 0.44–1.00)
GFR, Estimated: 48 mL/min — ABNORMAL LOW (ref >60)
Glucose, Bld: 100 mg/dL — ABNORMAL HIGH (ref 70–99)
Potassium: 3.2 mmol/L — ABNORMAL LOW (ref 3.5–5.1)
Sodium: 139 mmol/L (ref 135–145)
Total Bilirubin: 0.5 mg/dL (ref 0.3–1.2)
Total Protein: 6.6 g/dL (ref 6.5–8.1)

## 2022-10-28 LAB — AMMONIA: Ammonia: 44 umol/L — ABNORMAL HIGH (ref 9–35)

## 2022-10-28 LAB — VITAMIN B12: Vitamin B-12: 1614 pg/mL — ABNORMAL HIGH (ref 180–914)

## 2022-10-28 LAB — CK: Total CK: 207 U/L (ref 38–234)

## 2022-10-28 LAB — PHOSPHORUS: Phosphorus: 3.5 mg/dL (ref 2.5–4.6)

## 2022-10-28 LAB — MAGNESIUM: Magnesium: 2.1 mg/dL (ref 1.7–2.4)

## 2022-10-28 NOTE — Progress Notes (Signed)
PROGRESS NOTE  TASSIA MEDER WUJ:[id number] DOB: 09/28/1965   PCP: Lavinia Sharps, NP  Patient is from: Home  DOA: 12/24/2021 LOS: 306  Chief complaints No chief complaint on file.    Brief Narrative / Interim history: 57 year old with a history of bipolar disorder and chronic dementia with advanced cerebral atrophy who was admitted to the acute units 12/24/2021 following a preceding admission at [facility] 09/06/2021-12/24/2021. She was initially admitted to [facility] with acute mania and aggressive behaviors. On 12/24/2021 she became nearly catatonic and was transferred to the ER and ultimately admitted. During her acute hospital stay she has been evaluated by Neurology and Psychiatry. EEG has revealed no evidence of seizure activity. Repeat brain imaging was unremarkable. It was ultimately determined that her AMS was due to due progression of her severe underlying dementia.  The patient has been deemed medically stable and is awaiting a disposition. The goal is to place her within the memory care unit but this is proving difficult as she is homeless and has no insurance. Per TOC patient's mother plans to go to DSS to apply for special assistance Medicaid.  No events this week.    Subjective: Seen and examined earlier this morning.  No major events.  Intermittently lethargic from staying up at night.  Objective: Vitals:   10/27/22 0802 10/27/22 2028 10/28/22 0430 10/28/22 0530  BP: 105/75 112/88  117/80  Pulse: (!) 105 (!) 101  89  Resp: 18 18  17   Temp: 98.3 F (36.8 C) 98.2 F (36.8 C)  97.8 F (36.6 C)  TempSrc: Oral Axillary  Axillary  SpO2: 100% 100%  100%  Weight:   50 kg   Height:        Examination:  GENERAL: No apparent distress.  HEENT: MMM.  Vision and hearing grossly intact.  NECK: Supple.  No apparent JVD.  RESP:  No IWOB.  Fair aeration bilaterally. CVS:  RRR. Heart sounds normal.  ABD/GI/GU: BS+. Abd soft, NTND.  MSK/EXT:   No apparent deformity. No edema.   SKIN: no apparent skin lesion or wound NEURO: Awake.  Does not interact or follow command.  Moves extremities. PSYCH: No distress or agitation.   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  Somewhat lethargic Seen by neurology and psychiatry.   Extensive workup has been negative.  At this point there is no expectation of improvement.   Scheduled medication-Haldol 2 mg twice daily Discontinued as needed Atarax and Ativan.  Has not used them in days. Continue Zyprexa as needed Patient had a fall on 6/21.  CT head was unremarkable.   Bipolar Disorder Psych meds as above.   Vitamin B12 Deficiency:   Supplements ordered.    Essential hypertension  Not on any scheduled medications at this time.   Optimal BP parameters.    CKD Stage IIIa Stable.   Tobacco Abuse Resolved.    Sinus Tachycardia Noted occasionally.  Patient became bradycardic after she was started on metoprolol which was subsequently stopped.  Echocardiogram was unremarkable.  TSH was normal.   Constipation/altered GI function On Bowel regimen.  Nutrition Status: Body mass index is 17.79 kg/m. Nutrition Problem: Altered GI function Etiology: constipation Signs/Symptoms: other (comment) (last BM 10 days ago) Interventions: Other (Comment) (scheduled bowel regimen per MD)   DVT prophylaxis:  enoxaparin (LOVENOX) injection 40 mg Start: 12/26/21 1200  Code Status: Full code Family Communication: None at bedside Level of care: Med-Surg Status is:  Inpatient Remains inpatient appropriate because: Due to lack of safe disposition   Final disposition: Unknown  35 minutes with more than 50% spent in reviewing records, counseling patient/family and coordinating care.   Sch Meds:  Scheduled Meds:  vitamin B-12  500 mcg Oral Daily   enoxaparin (LOVENOX) injection  40 mg Subcutaneous Q24H   feeding supplement  237 mL Oral Q24H   haloperidol  2 mg Oral BID   hydrocerin   Topical BID   multivitamin with minerals  1 tablet Oral Daily   polyethylene glycol  17 g Oral Daily   senna-docusate  2 tablet Oral QHS   Continuous Infusions:  sodium chloride     PRN Meds:.acetaminophen, alum & mag hydroxide-simeth, bisacodyl, fluticasone, hydrALAZINE, ipratropium-albuterol, magnesium hydroxide, metoprolol tartrate, OLANZapine **OR** OLANZapine, ondansetron, mouth rinse  Antimicrobials: Anti-infectives (From admission, onward)    None        I have personally reviewed the following labs and images: CBC: No results for input(s): "WBC", "NEUTROABS", "HGB", "HCT", "MCV", "PLT" in the last 168 hours. BMP &GFR No results for input(s): "NA", "K", "CL", "CO2", "GLUCOSE", "BUN", "CREATININE", "CALCIUM", "MG", "PHOS" in the last 168 hours.  Invalid input(s): "GFRCG" Estimated Creatinine Clearance: 42.5 mL/min (A) (by C-G formula based on SCr of 1.18 mg/dL (H)). Liver & Pancreas: No results for input(s): "AST", "ALT", "ALKPHOS", "BILITOT", "PROT", "ALBUMIN" in the last 168 hours. No results for input(s): "LIPASE", "AMYLASE" in the last 168 hours. No results for input(s): "AMMONIA" in the last 168 hours. Diabetic: No results for input(s): "HGBA1C" in the last 72 hours. No results for input(s): "GLUCAP" in the last 168 hours. Cardiac Enzymes: No results for input(s): "CKTOTAL", "CKMB", "CKMBINDEX", "TROPONINI" in the last 168 hours. No results for input(s): "PROBNP" in the last 8760 hours. Coagulation Profile: No results for input(s): "INR",  "PROTIME" in the last 168 hours. Thyroid Function Tests: No results for input(s): "TSH", "T4TOTAL", "FREET4", "T3FREE", "THYROIDAB" in the last 72 hours. Lipid Profile: No results for input(s): "CHOL", "HDL", "LDLCALC", "TRIG", "CHOLHDL", "LDLDIRECT" in the last 72 hours. Anemia Panel: No results for input(s): "VITAMINB12", "FOLATE", "FERRITIN", "TIBC", "IRON", "RETICCTPCT" in the last 72 hours. Urine analysis:    Component Value Date/Time   COLORURINE YELLOW 06/28/2022 0630   APPEARANCEUR CLEAR 06/28/2022 0630   LABSPEC 1.021 06/28/2022 0630   PHURINE 5.0 06/28/2022 0630   GLUCOSEU NEGATIVE 06/28/2022 0630   HGBUR NEGATIVE 06/28/2022 0630   BILIRUBINUR NEGATIVE 06/28/2022 0630   BILIRUBINUR neg 08/07/2016 1526   KETONESUR NEGATIVE 06/28/2022 0630   PROTEINUR NEGATIVE 06/28/2022 0630   UROBILINOGEN 2.0 (A) 08/07/2016 1526   UROBILINOGEN 1 12/30/2013 1216   NITRITE NEGATIVE 06/28/2022 0630   LEUKOCYTESUR NEGATIVE 06/28/2022 0630   Sepsis Labs: Invalid input(s): "PROCALCITONIN", "LACTICIDVEN"  Microbiology: No results found for this or any previous visit (from the past 240 hour(s)).  Radiology Studies: No results found.    Azhane Eckart T. [facility]  If 7PM-7AM, please contact night-coverage [email or web address] 10/28/2022, 12:31 PM

## 2022-10-28 NOTE — Plan of Care (Signed)

## 2022-10-29 MED ORDER — POTASSIUM CHLORIDE CRYS ER 20 MEQ PO TBCR
40.0000 meq | EXTENDED_RELEASE_TABLET | ORAL | Status: AC
  Administered 2022-10-29 (×2): 40 meq via ORAL
  Filled 2022-10-29 (×2): qty 2

## 2022-10-29 MED ORDER — LACTULOSE 10 GM/15ML PO SOLN: 20.0000 g | Freq: Two times a day (BID) | ORAL | Status: DC | PRN

## 2022-10-29 NOTE — Progress Notes (Signed)
PROGRESS NOTE  Natalie Brown ZOX:[id number] DOB: [birth year]   PCP: Lavinia Sharps, NP  Patient is from: Home  DOA: 12/24/2021 LOS: 307  Chief complaints No chief complaint on file.    Brief Narrative / Interim history: 57 year old with a history of bipolar disorder and chronic dementia with advanced cerebral atrophy who was admitted to the acute units 12/24/2021 following a preceding admission at [facility] 09/06/2021-12/24/2021. She was initially admitted to [facility] with acute mania and aggressive behaviors. On 12/24/2021 she became nearly catatonic and was transferred to the ER and ultimately admitted. During her acute hospital stay she has been evaluated by Neurology and Psychiatry. EEG has revealed no evidence of seizure activity. Repeat brain imaging was unremarkable. It was ultimately determined that her AMS was due to due progression of her severe underlying dementia.  The patient has been deemed medically stable and is awaiting a disposition. The goal is to place her within the memory care unit but this is proving difficult as she is homeless and has no insurance. Per TOC patient's mother plans to go to DSS to apply for special assistance Medicaid.  No events this week.    Subjective: Seen and examined earlier this morning.  No major events.  Sleeping.  Per Recruitment consultant, she ate her breakfast, had bowel movement and took a bath and slept.  Objective: Vitals:   10/28/22 0530 10/28/22 1604 10/28/22 1948 10/29/22 0408  BP: 117/80 (!) 81/55 (!) 142/64 106/72  Pulse: 89 80 82 84  Resp: 17 18 18 18   Temp: 97.8 F (36.6 C) 97.7 F (36.5 C) 98.4 F (36.9 C) 97.9 F (36.6 C)  TempSrc: Axillary  Oral Oral  SpO2: 100% 100% 100% 100%  Weight:      Height:        Examination:  GENERAL: No apparent distress.  HEENT: MMM.  Vision and hearing grossly intact.  NECK: Supple.  No apparent JVD.  RESP:  No IWOB.  Fair aeration bilaterally. CVS:  RRR. Heart sounds normal.  ABD/GI/GU:  BS+. Abd soft, NTND.  MSK/EXT:   No apparent deformity. No edema.  SKIN: no apparent skin lesion or wound NEURO: Sleepy. PSYCH: No distress or agitation.   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  Somewhat lethargic Seen by neurology and psychiatry.   Extensive workup has been negative.  At this point there is no expectation of improvement.   Scheduled medication-Haldol 2 mg twice daily Discontinued as needed Atarax and Ativan.  Has not used them in days. Continue Zyprexa as needed Patient had a fall on 6/21.  CT head was unremarkable.   Bipolar Disorder Psych meds as above.   Vitamin B12 Deficiency:   Supplements ordered.    Essential hypertension  Not on any scheduled medications at this time.   Optimal BP parameters.    CKD Stage IIIa Stable.   Tobacco Abuse Resolved.    Sinus Tachycardia Noted occasionally.  Patient became bradycardic after she was started on metoprolol which was subsequently stopped.  Echocardiogram was unremarkable.  TSH was normal.   Constipation/altered GI function  On Bowel regimen.   Nutrition Status: Body mass index is 17.79 kg/m. Nutrition Problem: Altered GI function Etiology: constipation Signs/Symptoms: other (comment) (last BM 10 days ago) Interventions: Other (Comment) (scheduled bowel regimen per MD)   DVT prophylaxis:  enoxaparin (LOVENOX) injection 40 mg Start: 12/26/21 1200  Code Status: Full code Family Communication: None at bedside Level of care: Med-Surg Status  is: Inpatient Remains inpatient appropriate because: Due to lack of safe disposition   Final disposition: Unknown  35 minutes with more than 50% spent in reviewing records, counseling patient/family and coordinating care.   Sch Meds:  Scheduled Meds:  vitamin B-12  500 mcg Oral Daily   enoxaparin (LOVENOX) injection  40 mg Subcutaneous Q24H   feeding supplement  237 mL Oral Q24H   haloperidol  2 mg Oral BID   hydrocerin   Topical BID   multivitamin with minerals  1 tablet Oral Daily   polyethylene glycol  17 g Oral Daily   potassium chloride  40 mEq Oral Q4H   senna-docusate  2 tablet Oral QHS   Continuous Infusions:  sodium chloride     PRN Meds:.acetaminophen, alum & mag hydroxide-simeth, bisacodyl, fluticasone, hydrALAZINE, ipratropium-albuterol, lactulose, metoprolol tartrate, OLANZapine **OR** OLANZapine, ondansetron, mouth rinse  Antimicrobials: Anti-infectives (From admission, onward)    None        I have personally reviewed the following labs and images: CBC: No results for input(s): "WBC", "NEUTROABS", "HGB", "HCT", "MCV", "PLT" in the last 168 hours. BMP &GFR Recent Labs  Lab 10/28/22 1616  NA 139  K 3.2*  CL 105  CO2 22  GLUCOSE 100*  BUN 19  CREATININE 1.31*  CALCIUM 9.1  MG 2.1  PHOS 3.5   Estimated Creatinine Clearance: 38.3 mL/min (A) (by C-G formula based on SCr of 1.31 mg/dL (H)). Liver & Pancreas: Recent Labs  Lab 10/28/22 1616  AST 28  ALT 24  ALKPHOS 57  BILITOT 0.5  PROT 6.6  ALBUMIN 3.5   No results for input(s): "LIPASE", "AMYLASE" in the last 168 hours. Recent Labs  Lab 10/28/22 1616  AMMONIA 44*   Diabetic: No results for input(s): "HGBA1C" in the last 72 hours. No results for input(s): "GLUCAP" in the last 168 hours. Cardiac Enzymes: Recent Labs  Lab 10/28/22 1616  CKTOTAL 207   No results for input(s): "PROBNP" in the last 8760 hours. Coagulation Profile: No results for input(s): "INR", "PROTIME" in the last  168 hours. Thyroid Function Tests: No results for input(s): "TSH", "T4TOTAL", "FREET4", "T3FREE", "THYROIDAB" in the last 72 hours. Lipid Profile: No results for input(s): "CHOL", "HDL", "LDLCALC", "TRIG", "CHOLHDL", "LDLDIRECT" in the last 72 hours. Anemia Panel: Recent Labs    10/28/22 1616  VITAMINB12 1,614*   Urine analysis:    Component Value Date/Time   COLORURINE YELLOW 06/28/2022 0630   APPEARANCEUR CLEAR 06/28/2022 0630   LABSPEC 1.021 06/28/2022 0630   PHURINE 5.0 06/28/2022 0630   GLUCOSEU NEGATIVE 06/28/2022 0630   HGBUR NEGATIVE 06/28/2022 0630   BILIRUBINUR NEGATIVE 06/28/2022 0630   BILIRUBINUR neg 08/07/2016 1526   KETONESUR NEGATIVE 06/28/2022 0630   PROTEINUR NEGATIVE 06/28/2022 0630   UROBILINOGEN 2.0 (A) 08/07/2016 1526   UROBILINOGEN 1 12/30/2013 1216   NITRITE NEGATIVE 06/28/2022 0630   LEUKOCYTESUR NEGATIVE 06/28/2022 0630   Sepsis Labs: Invalid input(s): "PROCALCITONIN", "LACTICIDVEN"  Microbiology: No results found for this or any previous visit (from the past 240 hour(s)).  Radiology Studies: No results found.    Markevious Ehmke T. [facility]  If 7PM-7AM, please contact night-coverage [email or web address] 10/29/2022, 11:31 AM

## 2022-10-30 NOTE — Progress Notes (Signed)
PROGRESS NOTE  Natalie Brown:[id number] DOB: 04/21/1966   PCP: Lavinia Sharps, NP  Patient is from: Home  DOA: 12/24/2021 LOS: 308  Chief complaints No chief complaint on file.    Brief Narrative / Interim history: 57 year old with a history of bipolar disorder and chronic dementia with advanced cerebral atrophy who was admitted to the acute units 12/24/2021 following a preceding admission at [facility] 09/06/2021-12/24/2021. She was initially admitted to [facility] with acute mania and aggressive behaviors. On 12/24/2021 she became nearly catatonic and was transferred to the ER and ultimately admitted. During her acute hospital stay she has been evaluated by Neurology and Psychiatry. EEG has revealed no evidence of seizure activity. Repeat brain imaging was unremarkable. It was ultimately determined that her AMS was due to due progression of her severe underlying dementia.  The patient has been deemed medically stable and is awaiting a disposition. The goal is to place her within the memory care unit but this is proving difficult as she is homeless and has no insurance. Per TOC patient's mother plans to go to DSS to apply for special assistance Medicaid.  No events this week.    Subjective: In bed, no overnight event   Objective: Vitals:   10/29/22 0408 10/29/22 2011 10/30/22 0314 10/30/22 0524  BP: 106/72 116/89  101/84  Pulse: 84 (!) 101  94  Resp: 18 18    Temp: 97.9 F (36.6 C) 98 F (36.7 C)  98.2 F (36.8 C)  TempSrc: Oral Axillary    SpO2: 100% 100%  100%  Weight:   49.3 kg   Height:        Examination:  In bed-- head at foot of bed   Assessment and plan: Principal Problem:   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) Active Problems:   Delirium due to multiple etiologies, acute, hyperactive   Bipolar affective disorder, current episode manic with psychotic symptoms (HCC)   Sinus tachycardia   Tobacco abuse   Altered mental state   B12  deficiency   COVID-19 virus infection   Drooling   Essential hypertension  Acute Metabolic Encephalopathy on chronic progressive early onset Dementia: Nonverbal/noninteractive.  Somewhat lethargic Seen by neurology and psychiatry.   Extensive workup has been negative.  At this point there is no expectation of improvement.   Scheduled medication-Haldol 2 mg twice daily Continue Zyprexa as needed Patient had a fall on 6/21.  CT head was unremarkable.   Bipolar Disorder Psych meds as above.   Vitamin B12 Deficiency:   Supplements ordered.    Essential hypertension  Not on any scheduled medications at this time.   .    CKD Stage IIIa Stable.   Tobacco Abuse Resolved.    Sinus Tachycardia Noted occasionally.  Patient became bradycardic after she was started on metoprolol which was subsequently stopped.  Echocardiogram was unremarkable.  TSH was normal.   Constipation/altered GI function On Bowel regimen.   Nutrition Status: Body mass index is 17.54 kg/m. Nutrition Problem: Altered GI function Etiology: constipation Signs/Symptoms: other (comment) (last BM 10 days ago) Interventions: Other (Comment) (scheduled bowel regimen per MD)   DVT prophylaxis:  enoxaparin (LOVENOX) injection 40 mg Start: 12/26/21 1200  Code Status: Full code Family Communication: None at bedside Level of care: Med-Surg Status is: Inpatient Remains inpatient appropriate because: Due to lack of safe disposition   Final disposition: Unknown  35 minutes with more than 50% spent in reviewing records, counseling patient/family and coordinating care.  Sch Meds:  Scheduled Meds:  vitamin B-12  500 mcg Oral Daily   enoxaparin (LOVENOX) injection  40 mg Subcutaneous Q24H   feeding supplement  237 mL Oral Q24H   haloperidol  2 mg Oral BID   hydrocerin   Topical BID   multivitamin with minerals  1 tablet Oral Daily   polyethylene glycol  17 g Oral Daily   senna-docusate  2 tablet Oral QHS    Continuous Infusions:  sodium chloride     PRN Meds:.acetaminophen, alum & mag hydroxide-simeth, bisacodyl, fluticasone, hydrALAZINE, ipratropium-albuterol, lactulose, metoprolol tartrate, OLANZapine **OR** OLANZapine, ondansetron, mouth rinse  Antimicrobials: Anti-infectives (From admission, onward)    None        I have personally reviewed the following labs and images: CBC: No results for input(s): "WBC", "NEUTROABS", "HGB", "HCT", "MCV", "PLT" in the last 168 hours. BMP &GFR Recent Labs  Lab 10/28/22 1616  NA 139  K 3.2*  CL 105  CO2 22  GLUCOSE 100*  BUN 19  CREATININE 1.31*  CALCIUM 9.1  MG 2.1  PHOS 3.5   Estimated Creatinine Clearance: 37.8 mL/min (A) (by C-G formula based on SCr of 1.31 mg/dL (H)). Liver & Pancreas: Recent Labs  Lab 10/28/22 1616  AST 28  ALT 24  ALKPHOS 57  BILITOT 0.5  PROT 6.6  ALBUMIN 3.5   No results for input(s): "LIPASE", "AMYLASE" in the last 168 hours. Recent Labs  Lab 10/28/22 1616  AMMONIA 44*   Diabetic: No results for input(s): "HGBA1C" in the last 72 hours. No results for input(s): "GLUCAP" in the last 168 hours. Cardiac Enzymes: Recent Labs  Lab 10/28/22 1616  CKTOTAL 207   No results for input(s): "PROBNP" in the last 8760 hours. Coagulation Profile: No results for input(s): "INR", "PROTIME" in the last 168 hours. Thyroid Function Tests: No results for input(s): "TSH", "T4TOTAL", "FREET4", "T3FREE", "THYROIDAB" in the last 72 hours. Lipid Profile: No results for input(s): "CHOL", "HDL", "LDLCALC", "TRIG", "CHOLHDL", "LDLDIRECT" in the last 72 hours. Anemia Panel: Recent Labs    10/28/22 1616  VITAMINB12 1,614*   Urine analysis:    Component Value Date/Time   COLORURINE YELLOW 06/28/2022 0630   APPEARANCEUR CLEAR 06/28/2022 0630   LABSPEC 1.021 06/28/2022 0630   PHURINE 5.0 06/28/2022 0630   GLUCOSEU NEGATIVE 06/28/2022 0630   HGBUR NEGATIVE 06/28/2022 0630   BILIRUBINUR NEGATIVE 06/28/2022  0630   BILIRUBINUR neg 08/07/2016 1526   KETONESUR NEGATIVE 06/28/2022 0630   PROTEINUR NEGATIVE 06/28/2022 0630   UROBILINOGEN 2.0 (A) 08/07/2016 1526   UROBILINOGEN 1 12/30/2013 1216   NITRITE NEGATIVE 06/28/2022 0630   LEUKOCYTESUR NEGATIVE 06/28/2022 0630   Sepsis Labs: Invalid input(s): "PROCALCITONIN", "LACTICIDVEN"  Microbiology: No results found for this or any previous visit (from the past 240 hour(s)).  Radiology Studies: No results found.   Marlin Canary DO  If 7PM-7AM, please contact night-coverage [email or web address] 10/30/2022, 12:47 PM

## 2022-10-30 NOTE — TOC Progression Note (Signed)
Transition of Care [facility]) - Progression Note    Patient Details  Name: Natalie Brown MRN: [id number] Date of Birth: 1966-01-16  Transition of Care [facility]) CM/SW Contact  Inis Sizer, LCSW Phone Number: 10/30/2022, 2:30 PM  Clinical Narrative:    There are no discharge planning updates available at this time. CSW has been unable to obtain any bed offers from any facilities due to patient's age and mental health history.      Barriers to Discharge: Financial Resources, Inadequate or no insurance, Homeless with medical needs  Expected Discharge Plan and Services In-house Referral: Clinical Social Work   Post Acute Care Choice: Nursing Home Living arrangements for the past 2 months: Homeless [facility]) Expected Discharge Date: 12/24/21                                     Social Determinants of Health (SDOH) Interventions SDOH Screenings   Food Insecurity: No Food Insecurity (03/05/2022)  Housing: High Risk (03/21/2022)  Alcohol Screen: Low Risk  (09/06/2021)  Depression (PHQ2-9): Medium Risk (05/26/2020)  Tobacco Use: High Risk (03/21/2022)    Readmission Risk Interventions     No data to display

## 2022-10-31 LAB — BASIC METABOLIC PANEL
Anion gap: 11 (ref 5–15)
BUN: 11 mg/dL (ref 6–20)
CO2: 25 mmol/L (ref 22–32)
Calcium: 9.4 mg/dL (ref 8.9–10.3)
Chloride: 105 mmol/L (ref 98–111)
Creatinine, Ser: 1.13 mg/dL — ABNORMAL HIGH (ref 0.44–1.00)
GFR, Estimated: 57 mL/min — ABNORMAL LOW (ref >60)
Glucose, Bld: 99 mg/dL (ref 70–99)
Potassium: 4.7 mmol/L (ref 3.5–5.1)
Sodium: 141 mmol/L (ref 135–145)

## 2022-10-31 NOTE — Plan of Care (Signed)

## 2022-10-31 NOTE — Progress Notes (Signed)
PROGRESS NOTE  Natalie Brown ZOX:[id number] DOB: December 25, 1965   PCP: Lavinia Sharps, NP  Patient is from: Home  DOA: 12/24/2021 LOS: 309  Chief complaints No chief complaint on file.    Brief Narrative / Interim history: 57 year old with a history of bipolar disorder and chronic dementia with advanced cerebral atrophy who was admitted to the acute units 12/24/2021 following a preceding admission at [facility] 09/06/2021-12/24/2021. She was initially admitted to [facility] with acute mania and aggressive behaviors. On 12/24/2021 she became nearly catatonic and was transferred to the ER and ultimately admitted. During her acute hospital stay she has been evaluated by Neurology and Psychiatry. EEG has revealed no evidence of seizure activity. Repeat brain imaging was unremarkable. It was ultimately determined that her AMS was due to due progression of her severe underlying dementia.  The patient has been deemed medically stable and is awaiting a disposition. The goal is to place her within the memory care unit but this is proving difficult as she is homeless and has no insurance. Per TOC patient's mother plans to go to DSS to apply for special assistance Medicaid.  No events this week.    Subjective: No overnight events   Objective: Vitals:   10/30/22 0524 10/30/22 2035 10/31/22 0457 10/31/22 0614  BP: 101/84 94/60  104/69  Pulse: 94 60  97  Resp:  16    Temp: 98.2 F (36.8 C) 97.6 F (36.4 C)  98.5 F (36.9 C)  TempSrc:  Oral    SpO2: 100% 100%  100%  Weight:   49.1 kg   Height:        Examination:  In bed, sleeping sitting up   Assessment and plan: Principal Problem:   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) Active Problems:   Delirium due to multiple etiologies, acute, hyperactive   Bipolar affective disorder, current episode manic with psychotic symptoms (HCC)   Sinus tachycardia   Tobacco abuse   Altered mental state   B12 deficiency   COVID-19 virus  infection   Drooling   Essential hypertension  Acute Metabolic Encephalopathy on chronic progressive early onset Dementia: Nonverbal/noninteractive.  Somewhat lethargic Seen by neurology and psychiatry.   Extensive workup has been negative.  At this point there is no expectation of improvement.   Scheduled medication-Haldol 2 mg twice daily Continue Zyprexa as needed Patient had a fall on 6/21.  CT head was unremarkable.   Bipolar Disorder Psych meds as above.-- ? Need to back off as appears overmedicated?   Vitamin B12 Deficiency:   Supplements ordered.    Essential hypertension  Not on any scheduled medications at this time.   .    CKD Stage IIIa Stable.   Tobacco Abuse Resolved.    Sinus Tachycardia Noted occasionally.  Patient became bradycardic after she was started on metoprolol which was subsequently stopped.  Echocardiogram was unremarkable.  TSH was normal.   Constipation/altered GI function On Bowel regimen.   Nutrition Status: Body mass index is 17.47 kg/m. Nutrition Problem: Altered GI function Etiology: constipation Signs/Symptoms: other (comment) (last BM 10 days ago) Interventions: Other (Comment) (scheduled bowel regimen per MD)   DVT prophylaxis:  enoxaparin (LOVENOX) injection 40 mg Start: 12/26/21 1200  Code Status: Full code Family Communication: None at bedside Level of care: Med-Surg Status is: Inpatient Remains inpatient appropriate because: Due to lack of safe disposition   Final disposition: Unknown  35 minutes with more than 50% spent in reviewing records, counseling patient/family and coordinating  care.   Sch Meds:  Scheduled Meds:  vitamin B-12  500 mcg Oral Daily   enoxaparin (LOVENOX) injection  40 mg Subcutaneous Q24H   feeding supplement  237 mL Oral Q24H   haloperidol  2 mg Oral BID   hydrocerin   Topical BID   multivitamin with minerals  1 tablet Oral Daily   polyethylene glycol  17 g Oral Daily   senna-docusate  2  tablet Oral QHS   Continuous Infusions:   PRN Meds:.acetaminophen, alum & mag hydroxide-simeth, bisacodyl, fluticasone, hydrALAZINE, ipratropium-albuterol, lactulose, metoprolol tartrate, OLANZapine **OR** OLANZapine, ondansetron, mouth rinse  Antimicrobials: Anti-infectives (From admission, onward)    None        I have personally reviewed the following labs and images: CBC: No results for input(s): "WBC", "NEUTROABS", "HGB", "HCT", "MCV", "PLT" in the last 168 hours. BMP &GFR Recent Labs  Lab 10/28/22 1616  NA 139  K 3.2*  CL 105  CO2 22  GLUCOSE 100*  BUN 19  CREATININE 1.31*  CALCIUM 9.1  MG 2.1  PHOS 3.5   Estimated Creatinine Clearance: 37.6 mL/min (A) (by C-G formula based on SCr of 1.31 mg/dL (H)). Liver & Pancreas: Recent Labs  Lab 10/28/22 1616  AST 28  ALT 24  ALKPHOS 57  BILITOT 0.5  PROT 6.6  ALBUMIN 3.5   No results for input(s): "LIPASE", "AMYLASE" in the last 168 hours. Recent Labs  Lab 10/28/22 1616  AMMONIA 44*   Diabetic: No results for input(s): "HGBA1C" in the last 72 hours. No results for input(s): "GLUCAP" in the last 168 hours. Cardiac Enzymes: Recent Labs  Lab 10/28/22 1616  CKTOTAL 207   No results for input(s): "PROBNP" in the last 8760 hours. Coagulation Profile: No results for input(s): "INR", "PROTIME" in the last 168 hours. Thyroid Function Tests: No results for input(s): "TSH", "T4TOTAL", "FREET4", "T3FREE", "THYROIDAB" in the last 72 hours. Lipid Profile: No results for input(s): "CHOL", "HDL", "LDLCALC", "TRIG", "CHOLHDL", "LDLDIRECT" in the last 72 hours. Anemia Panel: Recent Labs    10/28/22 1616  VITAMINB12 1,614*   Urine analysis:    Component Value Date/Time   COLORURINE YELLOW 06/28/2022 0630   APPEARANCEUR CLEAR 06/28/2022 0630   LABSPEC 1.021 06/28/2022 0630   PHURINE 5.0 06/28/2022 0630   GLUCOSEU NEGATIVE 06/28/2022 0630   HGBUR NEGATIVE 06/28/2022 0630   BILIRUBINUR NEGATIVE 06/28/2022 0630    BILIRUBINUR neg 08/07/2016 1526   KETONESUR NEGATIVE 06/28/2022 0630   PROTEINUR NEGATIVE 06/28/2022 0630   UROBILINOGEN 2.0 (A) 08/07/2016 1526   UROBILINOGEN 1 12/30/2013 1216   NITRITE NEGATIVE 06/28/2022 0630   LEUKOCYTESUR NEGATIVE 06/28/2022 0630   Sepsis Labs: Invalid input(s): "PROCALCITONIN", "LACTICIDVEN"  Microbiology: No results found for this or any previous visit (from the past 240 hour(s)).  Radiology Studies: No results found.   Marlin Canary DO  If 7PM-7AM, please contact night-coverage [email or web address] 10/31/2022, 12:32 PM

## 2022-11-01 NOTE — Plan of Care (Signed)

## 2022-11-01 NOTE — Progress Notes (Signed)
PROGRESS NOTE  UNIQUIA GLIME ION:[id number] DOB: February 08, 1966   PCP: Lavinia Sharps, NP  Patient is from: Home  DOA: 12/24/2021 LOS: 310  Chief complaints No chief complaint on file.    Brief Narrative / Interim history: 57 year old with a history of bipolar disorder and chronic dementia with advanced cerebral atrophy who was admitted to the acute units 12/24/2021 following a preceding admission at [facility] 09/06/2021-12/24/2021. She was initially admitted to [facility] with acute mania and aggressive behaviors. On 12/24/2021 she became nearly catatonic and was transferred to the ER and ultimately admitted. During her acute hospital stay she has been evaluated by Neurology and Psychiatry. EEG has revealed no evidence of seizure activity. Repeat brain imaging was unremarkable. It was ultimately determined that her AMS was due to due progression of her severe underlying dementia.  The patient has been deemed medically stable and is awaiting a disposition. The goal is to place her within the memory care unit but this is proving difficult as she is homeless and has no insurance. Per TOC patient's mother plans to go to DSS to apply for special assistance Medicaid.  No events this week.    Subjective: No overnight events   Objective: Vitals:   10/31/22 0614 10/31/22 1853 11/01/22 0452 11/01/22 0511  BP: 104/69 (!) 90/53 (!) 89/58 (!) 112/95  Pulse: 97 94 66   Resp:   15   Temp: 98.5 F (36.9 C)  97.6 F (36.4 C)   TempSrc:   Axillary   SpO2: 100%  100%   Weight:   51.9 kg   Height:        Examination:  In bed, coloring   Assessment and plan: Principal Problem:   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) Active Problems:   Delirium due to multiple etiologies, acute, hyperactive   Bipolar affective disorder, current episode manic with psychotic symptoms (HCC)   Sinus tachycardia   Tobacco abuse   Altered mental state   B12 deficiency   COVID-19 virus infection    Drooling   Essential hypertension  Acute Metabolic Encephalopathy on chronic progressive early onset Dementia: Nonverbal/noninteractive.  Somewhat lethargic Seen by neurology and psychiatry.   Extensive workup has been negative.  At this point there is no expectation of improvement.   Scheduled medication-Haldol 2 mg twice daily Continue Zyprexa as needed Patient had a fall on 6/21.  CT head was unremarkable.   Bipolar Disorder Psych meds as above.-- ? Need to back off as appears overmedicated?   Vitamin B12 Deficiency:   Supplements ordered.    Essential hypertension  Not on any scheduled medications at this time.   .    CKD Stage IIIa Stable.   Tobacco Abuse Resolved.    Sinus Tachycardia Noted occasionally.  Patient became bradycardic after she was started on metoprolol which was subsequently stopped.  Echocardiogram was unremarkable.  TSH was normal.   Constipation/altered GI function On Bowel regimen.   Nutrition Status: Body mass index is 18.47 kg/m. Nutrition Problem: Altered GI function Etiology: constipation Signs/Symptoms: other (comment) (last BM 10 days ago) Interventions: Other (Comment) (scheduled bowel regimen per MD)   DVT prophylaxis:  enoxaparin (LOVENOX) injection 40 mg Start: 12/26/21 1200  Code Status: Full code Family Communication: None at bedside Level of care: Med-Surg Status is: Inpatient Remains inpatient appropriate because: Due to lack of safe disposition   Final disposition: Unknown  35 minutes with more than 50% spent in reviewing records, counseling patient/family and coordinating care.  Sch Meds:  Scheduled Meds:  vitamin B-12  500 mcg Oral Daily   enoxaparin (LOVENOX) injection  40 mg Subcutaneous Q24H   feeding supplement  237 mL Oral Q24H   haloperidol  2 mg Oral BID   hydrocerin   Topical BID   multivitamin with minerals  1 tablet Oral Daily   polyethylene glycol  17 g Oral Daily   senna-docusate  2 tablet Oral QHS    Continuous Infusions:   PRN Meds:.acetaminophen, alum & mag hydroxide-simeth, bisacodyl, fluticasone, hydrALAZINE, ipratropium-albuterol, lactulose, metoprolol tartrate, OLANZapine **OR** OLANZapine, ondansetron, mouth rinse  Antimicrobials: Anti-infectives (From admission, onward)    None        I have personally reviewed the following labs and images: CBC: No results for input(s): "WBC", "NEUTROABS", "HGB", "HCT", "MCV", "PLT" in the last 168 hours. BMP &GFR Recent Labs  Lab 10/28/22 1616 10/31/22 1405  NA 139 141  K 3.2* 4.7  CL 105 105  CO2 22 25  GLUCOSE 100* 99  BUN 19 11  CREATININE 1.31* 1.13*  CALCIUM 9.1 9.4  MG 2.1  --   PHOS 3.5  --    Estimated Creatinine Clearance: 46.1 mL/min (A) (by C-G formula based on SCr of 1.13 mg/dL (H)). Liver & Pancreas: Recent Labs  Lab 10/28/22 1616  AST 28  ALT 24  ALKPHOS 57  BILITOT 0.5  PROT 6.6  ALBUMIN 3.5   No results for input(s): "LIPASE", "AMYLASE" in the last 168 hours. Recent Labs  Lab 10/28/22 1616  AMMONIA 44*   Diabetic: No results for input(s): "HGBA1C" in the last 72 hours. No results for input(s): "GLUCAP" in the last 168 hours. Cardiac Enzymes: Recent Labs  Lab 10/28/22 1616  CKTOTAL 207   No results for input(s): "PROBNP" in the last 8760 hours. Coagulation Profile: No results for input(s): "INR", "PROTIME" in the last 168 hours. Thyroid Function Tests: No results for input(s): "TSH", "T4TOTAL", "FREET4", "T3FREE", "THYROIDAB" in the last 72 hours. Lipid Profile: No results for input(s): "CHOL", "HDL", "LDLCALC", "TRIG", "CHOLHDL", "LDLDIRECT" in the last 72 hours. Anemia Panel: No results for input(s): "VITAMINB12", "FOLATE", "FERRITIN", "TIBC", "IRON", "RETICCTPCT" in the last 72 hours.  Urine analysis:    Component Value Date/Time   COLORURINE YELLOW 06/28/2022 0630   APPEARANCEUR CLEAR 06/28/2022 0630   LABSPEC 1.021 06/28/2022 0630   PHURINE 5.0 06/28/2022 0630    GLUCOSEU NEGATIVE 06/28/2022 0630   HGBUR NEGATIVE 06/28/2022 0630   BILIRUBINUR NEGATIVE 06/28/2022 0630   BILIRUBINUR neg 08/07/2016 1526   KETONESUR NEGATIVE 06/28/2022 0630   PROTEINUR NEGATIVE 06/28/2022 0630   UROBILINOGEN 2.0 (A) 08/07/2016 1526   UROBILINOGEN 1 12/30/2013 1216   NITRITE NEGATIVE 06/28/2022 0630   LEUKOCYTESUR NEGATIVE 06/28/2022 0630   Sepsis Labs: Invalid input(s): "PROCALCITONIN", "LACTICIDVEN"  Microbiology: No results found for this or any previous visit (from the past 240 hour(s)).  Radiology Studies: No results found.   Marlin Canary DO  If 7PM-7AM, please contact night-coverage [email or web address] 11/01/2022, 11:53 AM

## 2022-11-02 NOTE — Progress Notes (Signed)
Patient was eating at last observation, alarms sounded.  Staff responded immediately, NT observed patient standing and reaching for trash can.  Fell on hip, broke fall with arm.  Was able to get up and back to bed with minimal assistance and no verbal/nonverbal cues to pain or injury.  MD and Charge RN notified

## 2022-11-02 NOTE — Plan of Care (Signed)
Patient alert, nonverbal.  Appetite fair for meals with moderate assist from staff.  Ambulates with assistance in room  No verbal/nonverbal cues to pain or discomfort Problem: Education: Goal: Knowledge of General Education information will improve Description: Including pain rating scale, medication(s)/side effects and non-pharmacologic comfort measures Outcome: Progressing   Problem: Health Behavior/Discharge Planning: Goal: Ability to manage health-related needs will improve Outcome: Progressing   Problem: Clinical Measurements: Goal: Ability to maintain clinical measurements within normal limits will improve Outcome: Progressing Goal: Will remain free from infection Outcome: Progressing Goal: Diagnostic test results will improve Outcome: Progressing Goal: Respiratory complications will improve Outcome: Progressing Goal: Cardiovascular complication will be avoided Outcome: Progressing   Problem: Activity: Goal: Risk for activity intolerance will decrease Outcome: Progressing   Problem: Nutrition: Goal: Adequate nutrition will be maintained Outcome: Progressing   Problem: Coping: Goal: Level of anxiety will decrease Outcome: Progressing   Problem: Elimination: Goal: Will not experience complications related to bowel motility Outcome: Progressing Goal: Will not experience complications related to urinary retention Outcome: Progressing   Problem: Pain Managment: Goal: General experience of comfort will improve Outcome: Progressing   Problem: Safety: Goal: Ability to remain free from injury will improve Outcome: Progressing   Problem: Skin Integrity: Goal: Risk for impaired skin integrity will decrease Outcome: Progressing

## 2022-11-02 NOTE — Progress Notes (Signed)
PROGRESS NOTE  Natalie Brown:[id number] DOB: 1965-11-27   PCP: Lavinia Sharps, NP  Patient is from: Home  DOA: 12/24/2021 LOS: 311  Chief complaints No chief complaint on file.    Brief Narrative / Interim history: 57 year old with a history of bipolar disorder and chronic dementia with advanced cerebral atrophy who was admitted to the acute units 12/24/2021 following a preceding admission at [facility] 09/06/2021-12/24/2021. She was initially admitted to [facility] with acute mania and aggressive behaviors. On 12/24/2021 she became nearly catatonic and was transferred to the ER and ultimately admitted. During her acute hospital stay she has been evaluated by Neurology and Psychiatry. EEG has revealed no evidence of seizure activity. Repeat brain imaging was unremarkable. It was ultimately determined that her AMS was due to due progression of her severe underlying dementia.  The patient has been deemed medically stable and is awaiting a disposition. The goal is to place her within the memory care unit but this is proving difficult as she is homeless and has no insurance. Per TOC patient's mother plans to go to DSS to apply for special assistance Medicaid. No issues or events of note this week.    Subjective: No overnight events   Objective: Vitals:   11/01/22 1420 11/01/22 2042 11/02/22 0612 11/02/22 0739  BP: (!) 140/84 94/62  110/69  Pulse: (!) 107 63  100  Resp: 16 18  20   Temp: 98 F (36.7 C) 98 F (36.7 C)  98.2 F (36.8 C)  TempSrc: Axillary   Axillary  SpO2: 99% 100%  100%  Weight:   48.6 kg   Height:        Examination:  In bed, resting.  In no acute distress   Assessment and plan: Principal Problem:   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) Active Problems:   Delirium due to multiple etiologies, acute, hyperactive   Bipolar affective disorder, current episode manic with psychotic symptoms (HCC)   Sinus tachycardia   Tobacco abuse   Altered  mental state   B12 deficiency   COVID-19 virus infection   Drooling   Essential hypertension  Acute Metabolic Encephalopathy on chronic progressive early onset Dementia: Nonverbal/noninteractive.  Somewhat lethargic Seen by neurology and psychiatry.   Extensive workup has been negative.  At this point there is no expectation of improvement.   Scheduled medication-Haldol 2 mg twice daily Continue Zyprexa as needed Patient had a fall on 6/21.  CT head was unremarkable.   Bipolar Disorder Psych meds as above.-- ? Need to back off as appears overmedicated?   Vitamin B12 Deficiency:   Supplements ordered.    Essential hypertension  Not on any scheduled medications at this time.   .    CKD Stage IIIa Stable.   Tobacco Abuse Resolved.    Sinus Tachycardia Noted occasionally.  Patient became bradycardic after she was started on metoprolol which was subsequently stopped.  Echocardiogram was unremarkable.  TSH was normal.   Constipation/altered GI function On Bowel regimen.   Nutrition Status: Body mass index is 17.29 kg/m. Nutrition Problem: Altered GI function Etiology: constipation Signs/Symptoms: other (comment) (last BM 10 days ago) Interventions: Other (Comment) (scheduled bowel regimen per MD)   DVT prophylaxis:  enoxaparin (LOVENOX) injection 40 mg Start: 12/26/21 1200  Code Status: Full code Family Communication: None at bedside Level of care: Med-Surg Status is: Inpatient Remains inpatient appropriate because: Due to lack of safe disposition   Final disposition: Unknown  35 minutes with more than 50%  spent in reviewing records, counseling patient/family and coordinating care.   Sch Meds:  Scheduled Meds:  vitamin B-12  500 mcg Oral Daily   enoxaparin (LOVENOX) injection  40 mg Subcutaneous Q24H   feeding supplement  237 mL Oral Q24H   haloperidol  2 mg Oral BID   hydrocerin   Topical BID   multivitamin with minerals  1 tablet Oral Daily    polyethylene glycol  17 g Oral Daily   senna-docusate  2 tablet Oral QHS   Continuous Infusions:   PRN Meds:.acetaminophen, alum & mag hydroxide-simeth, bisacodyl, fluticasone, hydrALAZINE, ipratropium-albuterol, lactulose, metoprolol tartrate, OLANZapine **OR** OLANZapine, ondansetron, mouth rinse  Antimicrobials: Anti-infectives (From admission, onward)    None      I have personally reviewed the following labs and images: CBC: No results for input(s): "WBC", "NEUTROABS", "HGB", "HCT", "MCV", "PLT" in the last 168 hours. BMP &GFR Recent Labs  Lab 10/28/22 1616 10/31/22 1405  NA 139 141  K 3.2* 4.7  CL 105 105  CO2 22 25  GLUCOSE 100* 99  BUN 19 11  CREATININE 1.31* 1.13*  CALCIUM 9.1 9.4  MG 2.1  --   PHOS 3.5  --    Estimated Creatinine Clearance: 43.2 mL/min (A) (by C-G formula based on SCr of 1.13 mg/dL (H)). Liver & Pancreas: Recent Labs  Lab 10/28/22 1616  AST 28  ALT 24  ALKPHOS 57  BILITOT 0.5  PROT 6.6  ALBUMIN 3.5   No results for input(s): "LIPASE", "AMYLASE" in the last 168 hours. Recent Labs  Lab 10/28/22 1616  AMMONIA 44*   Diabetic: No results for input(s): "HGBA1C" in the last 72 hours. No results for input(s): "GLUCAP" in the last 168 hours. Cardiac Enzymes: Recent Labs  Lab 10/28/22 1616  CKTOTAL 207   No results for input(s): "PROBNP" in the last 8760 hours. Coagulation Profile: No results for input(s): "INR", "PROTIME" in the last 168 hours. Thyroid Function Tests: No results for input(s): "TSH", "T4TOTAL", "FREET4", "T3FREE", "THYROIDAB" in the last 72 hours. Lipid Profile: No results for input(s): "CHOL", "HDL", "LDLCALC", "TRIG", "CHOLHDL", "LDLDIRECT" in the last 72 hours. Anemia Panel: No results for input(s): "VITAMINB12", "FOLATE", "FERRITIN", "TIBC", "IRON", "RETICCTPCT" in the last 72 hours.  Urine analysis:    Component Value Date/Time   COLORURINE YELLOW 06/28/2022 0630   APPEARANCEUR CLEAR 06/28/2022 0630    LABSPEC 1.021 06/28/2022 0630   PHURINE 5.0 06/28/2022 0630   GLUCOSEU NEGATIVE 06/28/2022 0630   HGBUR NEGATIVE 06/28/2022 0630   BILIRUBINUR NEGATIVE 06/28/2022 0630   BILIRUBINUR neg 08/07/2016 1526   KETONESUR NEGATIVE 06/28/2022 0630   PROTEINUR NEGATIVE 06/28/2022 0630   UROBILINOGEN 2.0 (A) 08/07/2016 1526   UROBILINOGEN 1 12/30/2013 1216   NITRITE NEGATIVE 06/28/2022 0630   LEUKOCYTESUR NEGATIVE 06/28/2022 0630   Sepsis Labs: Invalid input(s): "PROCALCITONIN", "LACTICIDVEN"  Microbiology: No results found for this or any previous visit (from the past 240 hour(s)).  Radiology Studies: No results found.   Carma Leaven DO  If 7PM-7AM, please contact night-coverage [email or web address] 11/02/2022, 7:43 AM

## 2022-11-02 NOTE — Plan of Care (Signed)
  Problem: Coping: Goal: Level of anxiety will decrease Outcome: Progressing   Problem: Skin Integrity: Goal: Risk for impaired skin integrity will decrease Outcome: Progressing   Problem: Coping: Goal: Level of anxiety will decrease Outcome: Progressing   Problem: Skin Integrity: Goal: Risk for impaired skin integrity will decrease Outcome: Progressing

## 2022-11-02 NOTE — Progress Notes (Signed)
   11/02/22 1445  What Happened  Was fall witnessed? Yes  Who witnessed fall? Julissa NT  Patients activity before fall  (in bed eating)  Point of contact arm/shoulder  Was patient injured? No  Patient found on floor  Found by Staff-comment  Provider Notification  Provider Name/Title Carma Leaven  Date Provider Notified 11/02/22  Time Provider Notified 1435  Method of Notification Page  Notification Reason Fall  Provider response No new orders  Date of Provider Response 11/02/22  Time of Provider Response 1447  Follow Up  Additional tests No  Progress note created (see row info) Yes  Adult Fall Risk Assessment  Risk Factor Category (scoring not indicated) Fall has occurred during this admission (document High fall risk)  Age 57  Fall History: Fall within 6 months prior to admission 5  Elimination; Bowel and/or Urine Incontinence 2  Elimination; Bowel and/or Urine Urgency/Frequency 2  Medications: includes PCA/Opiates, Anti-convulsants, Anti-hypertensives, Diuretics, Hypnotics, Laxatives, Sedatives, and Psychotropics 3  Patient Care Equipment 0  Mobility-Assistance 2  Mobility-Gait 2  Mobility-Sensory Deficit 0  Altered awareness of immediate physical environment 1  Impulsiveness 2  Lack of understanding of one's physical/cognitive limitations 4  Total Score 23  Patient Fall Risk Level High fall risk  Adult Fall Risk Interventions  Required Bundle Interventions *See Row Information* High fall risk - low, moderate, and high requirements implemented  Additional Interventions Reorient/diversional activities with confused patients  Screening for Fall Injury Risk (To be completed on HIGH fall risk patients) - Assessing Need for Floor Mats  Risk For Fall Injury- Criteria for Floor Mats Previous fall this admission  Will Implement Floor Mats Yes  Pain Assessment  Pain Scale PAINAD  Pain Score 0  PAINAD (Pain Assessment in Advanced Dementia)  Breathing 0  Negative  Vocalization 0  Facial Expression 0  Body Language 0  Consolability 0  PAINAD Score 0  Pain Assessment/FLACC  Pain Rating: FLACC  - Face 0  Pain Rating: FLACC - Legs 0  Pain Rating: FLACC - Activity 0  Pain Rating: FLACC - Cry 0  Pain Rating: FLACC - Consolability 0  Score: FLACC  0  Neurological  Neuro (WDL) X  Level of Consciousness Alert  Orientation Level Disoriented X4  Cognition Impulsive;Poor attention/concentration;Poor judgement;Poor safety awareness;Unable to follow commands  Speech Mute  Musculoskeletal  Musculoskeletal (WDL) X  Generalized Weakness Yes (unsteady)  Musculoskeletal Details  RLE Full movement  LLE Full movement  Integumentary  Integumentary (WDL) WDL

## 2022-11-03 NOTE — Progress Notes (Signed)
PROGRESS NOTE  Natalie Brown HYQ:[id number] DOB: [birth year]   PCP: Lavinia Sharps, NP  Patient is from: Home  DOA: 12/24/2021 LOS: 312  Chief complaints No chief complaint on file.    Brief Narrative / Interim history: 57 year old with a history of bipolar disorder and chronic dementia with advanced cerebral atrophy who was admitted to the acute units 12/24/2021 following a preceding admission at [facility] 09/06/2021-12/24/2021. She was initially admitted to [facility] with acute mania and aggressive behaviors. On 12/24/2021 she became nearly catatonic and was transferred to the ER and ultimately admitted. During her acute hospital stay she has been evaluated by Neurology and Psychiatry. EEG has revealed no evidence of seizure activity. Repeat brain imaging was unremarkable. It was ultimately determined that her AMS was due to due progression of her severe underlying dementia.  The patient has been deemed medically stable and is awaiting a disposition. The goal is to place her within the memory care unit but this is proving difficult as she is homeless and has no insurance. Per TOC patient's mother plans to go to DSS to apply for special assistance Medicaid. No issues or events of note this week.    Subjective: FALL 13th - no sitter available at the time, no acute issues/events overnight  Objective: Vitals:   11/02/22 1455 11/02/22 1602 11/02/22 2108 11/03/22 0622  BP: (!) 148/66 97/63 98/72    Pulse: (!) 108 71 64   Resp:   18   Temp:   (!) 97.4 F (36.3 C)   TempSrc:      SpO2: 100%  100%   Weight:    48.3 kg  Height:        Examination:  In bed, resting.  In no acute distress   Assessment and plan: Principal Problem:   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) Active Problems:   Delirium due to multiple etiologies, acute, hyperactive   Bipolar affective disorder, current episode manic with psychotic symptoms (HCC)   Sinus tachycardia   Tobacco abuse    Altered mental state   B12 deficiency   COVID-19 virus infection   Drooling   Essential hypertension  Acute Metabolic Encephalopathy on chronic progressive early onset Dementia: Nonverbal/noninteractive.  Somewhat lethargic Seen by neurology and psychiatry.   Extensive workup has been negative.  At this point there is no expectation of improvement.   Scheduled medication-Haldol 2 mg twice daily Continue Zyprexa as needed Patient had a fall on 6/21.  CT head was unremarkable.   Bipolar Disorder Psych meds as above.-- ? Need to back off as appears overmedicated?   Vitamin B12 Deficiency:   Supplements ordered.    Essential hypertension  Not on any scheduled medications at this time.   .    CKD Stage IIIa Stable.   Tobacco Abuse Resolved.    Sinus Tachycardia Noted occasionally.  Patient became bradycardic after she was started on metoprolol which was subsequently stopped.  Echocardiogram was unremarkable.  TSH was normal.   Constipation/altered GI function On Bowel regimen.   Nutrition Status: Body mass index is 17.19 kg/m. Nutrition Problem: Altered GI function Etiology: constipation Signs/Symptoms: other (comment) (last BM 10 days ago) Interventions: Other (Comment) (scheduled bowel regimen per MD)   DVT prophylaxis:  enoxaparin (LOVENOX) injection 40 mg Start: 12/26/21 1200  Code Status: Full code Family Communication: None at bedside Level of care: Med-Surg Status is: Inpatient Remains inpatient appropriate because: Due to lack of safe disposition   Final disposition: Unknown  35 minutes  with more than 50% spent in reviewing records, counseling patient/family and coordinating care.   Sch Meds:  Scheduled Meds:  vitamin B-12  500 mcg Oral Daily   enoxaparin (LOVENOX) injection  40 mg Subcutaneous Q24H   feeding supplement  237 mL Oral Q24H   haloperidol  2 mg Oral BID   hydrocerin   Topical BID   multivitamin with minerals  1 tablet Oral Daily    polyethylene glycol  17 g Oral Daily   senna-docusate  2 tablet Oral QHS   Continuous Infusions:   PRN Meds:.acetaminophen, alum & mag hydroxide-simeth, bisacodyl, fluticasone, hydrALAZINE, ipratropium-albuterol, lactulose, metoprolol tartrate, OLANZapine **OR** OLANZapine, ondansetron, mouth rinse  Antimicrobials: Anti-infectives (From admission, onward)    None      I have personally reviewed the following labs and images: CBC: No results for input(s): "WBC", "NEUTROABS", "HGB", "HCT", "MCV", "PLT" in the last 168 hours. BMP &GFR Recent Labs  Lab 10/28/22 1616 10/31/22 1405  NA 139 141  K 3.2* 4.7  CL 105 105  CO2 22 25  GLUCOSE 100* 99  BUN 19 11  CREATININE 1.31* 1.13*  CALCIUM 9.1 9.4  MG 2.1  --   PHOS 3.5  --    Estimated Creatinine Clearance: 42.9 mL/min (A) (by C-G formula based on SCr of 1.13 mg/dL (H)). Liver & Pancreas: Recent Labs  Lab 10/28/22 1616  AST 28  ALT 24  ALKPHOS 57  BILITOT 0.5  PROT 6.6  ALBUMIN 3.5   No results for input(s): "LIPASE", "AMYLASE" in the last 168 hours. Recent Labs  Lab 10/28/22 1616  AMMONIA 44*   Diabetic: No results for input(s): "HGBA1C" in the last 72 hours. No results for input(s): "GLUCAP" in the last 168 hours. Cardiac Enzymes: Recent Labs  Lab 10/28/22 1616  CKTOTAL 207   No results for input(s): "PROBNP" in the last 8760 hours. Coagulation Profile: No results for input(s): "INR", "PROTIME" in the last 168 hours. Thyroid Function Tests: No results for input(s): "TSH", "T4TOTAL", "FREET4", "T3FREE", "THYROIDAB" in the last 72 hours. Lipid Profile: No results for input(s): "CHOL", "HDL", "LDLCALC", "TRIG", "CHOLHDL", "LDLDIRECT" in the last 72 hours. Anemia Panel: No results for input(s): "VITAMINB12", "FOLATE", "FERRITIN", "TIBC", "IRON", "RETICCTPCT" in the last 72 hours.  Urine analysis:    Component Value Date/Time   COLORURINE YELLOW 06/28/2022 0630   APPEARANCEUR CLEAR 06/28/2022 0630    LABSPEC 1.021 06/28/2022 0630   PHURINE 5.0 06/28/2022 0630   GLUCOSEU NEGATIVE 06/28/2022 0630   HGBUR NEGATIVE 06/28/2022 0630   BILIRUBINUR NEGATIVE 06/28/2022 0630   BILIRUBINUR neg 08/07/2016 1526   KETONESUR NEGATIVE 06/28/2022 0630   PROTEINUR NEGATIVE 06/28/2022 0630   UROBILINOGEN 2.0 (A) 08/07/2016 1526   UROBILINOGEN 1 12/30/2013 1216   NITRITE NEGATIVE 06/28/2022 0630   LEUKOCYTESUR NEGATIVE 06/28/2022 0630   Sepsis Labs: Invalid input(s): "PROCALCITONIN", "LACTICIDVEN"  Microbiology: No results found for this or any previous visit (from the past 240 hour(s)).  Radiology Studies: No results found.   Carma Leaven DO  If 7PM-7AM, please contact night-coverage [email or web address] 11/03/2022, 7:33 AM

## 2022-11-03 NOTE — Plan of Care (Signed)

## 2022-11-04 NOTE — Plan of Care (Signed)
  Problem: Clinical Measurements: Goal: Will remain free from infection 11/04/2022 1458 by Adonis Brook, RN Outcome: Progressing 11/04/2022 1457 by Adonis Brook, RN Outcome: Not Progressing Goal: Cardiovascular complication will be avoided 11/04/2022 1458 by Adonis Brook, RN Outcome: Progressing 11/04/2022 1457 by Adonis Brook, RN Outcome: Not Progressing   Problem: Activity: Goal: Risk for activity intolerance will decrease 11/04/2022 1458 by Adonis Brook, RN Outcome: Progressing 11/04/2022 1457 by Adonis Brook, RN Outcome: Not Progressing   Problem: Nutrition: Goal: Adequate nutrition will be maintained 11/04/2022 1458 by Adonis Brook, RN Outcome: Progressing 11/04/2022 1457 by Adonis Brook, RN Outcome: Not Progressing   Problem: Elimination: Goal: Will not experience complications related to urinary retention 11/04/2022 1458 by Adonis Brook, RN Outcome: Progressing 11/04/2022 1457 by Adonis Brook, RN Outcome: Not Progressing

## 2022-11-04 NOTE — Progress Notes (Signed)
PROGRESS NOTE  Natalie Brown:[id number] DOB: [birth year]   PCP: Lavinia Sharps, NP  Patient is from: Home  DOA: 12/24/2021 LOS: 313  Chief complaints No chief complaint on file.    Brief Narrative / Interim history: 57 year old with a history of bipolar disorder and chronic dementia with advanced cerebral atrophy who was admitted to the acute units 12/24/2021 following a preceding admission at [facility] 09/06/2021-12/24/2021. She was initially admitted to [facility] with acute mania and aggressive behaviors. On 12/24/2021 she became nearly catatonic and was transferred to the ER and ultimately admitted. During her acute hospital stay she has been evaluated by Neurology and Psychiatry. EEG has revealed no evidence of seizure activity. Repeat brain imaging was unremarkable. It was ultimately determined that her AMS was due to due progression of her severe underlying dementia.  The patient has been deemed medically stable and is awaiting a disposition. The goal is to place her within the memory care unit but this is proving difficult as she is homeless and has no insurance. Per TOC patient's mother plans to go to DSS to apply for special assistance Medicaid. No issues or events of note this week.    Subjective: BP appears to be trending down -fell on the 13th  Objective: Vitals:   11/03/22 2000 11/04/22 0007 11/04/22 0410 11/04/22 0843  BP: 112/69 (!) 86/54 96/67 (!) 81/58  Pulse: 87 65 (!) 53 64  Resp: 16  15 18   Temp: 98.5 F (36.9 C)  97.6 F (36.4 C)   TempSrc: Axillary  Axillary   SpO2: 100% 100% 100% 100%  Weight:      Height:        Examination:  In bed, non-verbal   Assessment and plan: Principal Problem:   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) Active Problems:   Delirium due to multiple etiologies, acute, hyperactive   Bipolar affective disorder, current episode manic with psychotic symptoms (HCC)   Sinus tachycardia   Tobacco abuse   Altered  mental state   B12 deficiency   COVID-19 virus infection   Drooling   Essential hypertension  Acute Metabolic Encephalopathy on chronic progressive early onset Dementia: Nonverbal/noninteractive.  Somewhat lethargic Seen by neurology and psychiatry.   Extensive workup has been negative.  At this point there is no expectation of improvement.   Scheduled medication-Haldol 2 mg twice daily Continue Zyprexa as needed Patient had a fall on 7/13   Bipolar Disorder Psych meds as above.-- ? Need to back off as appears overmedicated?   Vitamin B12 Deficiency:   Supplements ordered.    Essential hypertension  Not on any scheduled medications at this time.   .    CKD Stage IIIa Stable.   Tobacco Abuse Resolved.    Sinus Tachycardia Noted occasionally.  Patient became bradycardic after she was started on metoprolol which was subsequently stopped.  Echocardiogram was unremarkable.  TSH was normal.   Constipation/altered GI function On Bowel regimen.   Nutrition Status: Body mass index is 17.19 kg/m. Nutrition Problem: Altered GI function Etiology: constipation Signs/Symptoms: other (comment) (last BM 10 days ago) Interventions: Other (Comment) (scheduled bowel regimen per MD)   DVT prophylaxis:  enoxaparin (LOVENOX) injection 40 mg Start: 12/26/21 1200  Code Status: Full code Family Communication: None at bedside Level of care: Med-Surg Status is: Inpatient Remains inpatient appropriate because: Due to lack of safe disposition   Final disposition: Unknown  35 minutes with more than 50% spent in reviewing records, counseling patient/family and  coordinating care.   Sch Meds:  Scheduled Meds:  vitamin B-12  500 mcg Oral Daily   enoxaparin (LOVENOX) injection  40 mg Subcutaneous Q24H   feeding supplement  237 mL Oral Q24H   haloperidol  2 mg Oral BID   hydrocerin   Topical BID   multivitamin with minerals  1 tablet Oral Daily   polyethylene glycol  17 g Oral Daily    senna-docusate  2 tablet Oral QHS   Continuous Infusions:   PRN Meds:.acetaminophen, alum & mag hydroxide-simeth, bisacodyl, fluticasone, hydrALAZINE, ipratropium-albuterol, lactulose, metoprolol tartrate, OLANZapine **OR** OLANZapine, ondansetron, mouth rinse  Antimicrobials: Anti-infectives (From admission, onward)    None      I have personally reviewed the following labs and images: CBC: No results for input(s): "WBC", "NEUTROABS", "HGB", "HCT", "MCV", "PLT" in the last 168 hours. BMP &GFR Recent Labs  Lab 10/28/22 1616 10/31/22 1405  NA 139 141  K 3.2* 4.7  CL 105 105  CO2 22 25  GLUCOSE 100* 99  BUN 19 11  CREATININE 1.31* 1.13*  CALCIUM 9.1 9.4  MG 2.1  --   PHOS 3.5  --    Estimated Creatinine Clearance: 42.9 mL/min (A) (by C-G formula based on SCr of 1.13 mg/dL (H)). Liver & Pancreas: Recent Labs  Lab 10/28/22 1616  AST 28  ALT 24  ALKPHOS 57  BILITOT 0.5  PROT 6.6  ALBUMIN 3.5   No results for input(s): "LIPASE", "AMYLASE" in the last 168 hours. Recent Labs  Lab 10/28/22 1616  AMMONIA 44*   Diabetic: No results for input(s): "HGBA1C" in the last 72 hours. No results for input(s): "GLUCAP" in the last 168 hours. Cardiac Enzymes: Recent Labs  Lab 10/28/22 1616  CKTOTAL 207   No results for input(s): "PROBNP" in the last 8760 hours. Coagulation Profile: No results for input(s): "INR", "PROTIME" in the last 168 hours. Thyroid Function Tests: No results for input(s): "TSH", "T4TOTAL", "FREET4", "T3FREE", "THYROIDAB" in the last 72 hours. Lipid Profile: No results for input(s): "CHOL", "HDL", "LDLCALC", "TRIG", "CHOLHDL", "LDLDIRECT" in the last 72 hours. Anemia Panel: No results for input(s): "VITAMINB12", "FOLATE", "FERRITIN", "TIBC", "IRON", "RETICCTPCT" in the last 72 hours.  Urine analysis:    Component Value Date/Time   COLORURINE YELLOW 06/28/2022 0630   APPEARANCEUR CLEAR 06/28/2022 0630   LABSPEC 1.021 06/28/2022 0630   PHURINE  5.0 06/28/2022 0630   GLUCOSEU NEGATIVE 06/28/2022 0630   HGBUR NEGATIVE 06/28/2022 0630   BILIRUBINUR NEGATIVE 06/28/2022 0630   BILIRUBINUR neg 08/07/2016 1526   KETONESUR NEGATIVE 06/28/2022 0630   PROTEINUR NEGATIVE 06/28/2022 0630   UROBILINOGEN 2.0 (A) 08/07/2016 1526   UROBILINOGEN 1 12/30/2013 1216   NITRITE NEGATIVE 06/28/2022 0630   LEUKOCYTESUR NEGATIVE 06/28/2022 0630   Sepsis Labs: Invalid input(s): "PROCALCITONIN", "LACTICIDVEN"  Microbiology: No results found for this or any previous visit (from the past 240 hour(s)).  Radiology Studies: No results found.   Marlin Canary DO  If 7PM-7AM, please contact night-coverage [email or web address] 11/04/2022, 12:19 PM

## 2022-11-04 NOTE — Plan of Care (Signed)
  Problem: Activity: Goal: Risk for activity intolerance will decrease Outcome: Progressing   Problem: Coping: Goal: Level of anxiety will decrease Outcome: Progressing   Problem: Elimination: Goal: Will not experience complications related to bowel motility Outcome: Progressing   Problem: Elimination: Goal: Will not experience complications related to urinary retention Outcome: Progressing   Problem: Safety: Goal: Ability to remain free from injury will improve Outcome: Progressing

## 2022-11-04 NOTE — Progress Notes (Signed)
Pt with agitation tonight, standing on bed while attempting med pass. PO meds successfully taken with PRN zyprexa. Sitter until 11pm. Pt sleeping in bed at this time. All safety measures in place. RN/tech sitting outside of room. Will continue with Plan of care.

## 2022-11-04 NOTE — Plan of Care (Signed)
  Problem: Education: Goal: Knowledge of General Education information will improve Description: Including pain rating scale, medication(s)/side effects and non-pharmacologic comfort measures 11/04/2022 2206 by Ronna Polio, RN Outcome: Progressing 11/04/2022 2206 by Ronna Polio, RN Outcome: Not Progressing   Problem: Health Behavior/Discharge Planning: Goal: Ability to manage health-related needs will improve 11/04/2022 2206 by Ronna Polio, RN Outcome: Progressing 11/04/2022 2206 by Raynelle Dick D, RN Outcome: Not Progressing   Problem: Clinical Measurements: Goal: Ability to maintain clinical measurements within normal limits will improve 11/04/2022 2206 by Raynelle Dick D, RN Outcome: Progressing 11/04/2022 2206 by Raynelle Dick D, RN Outcome: Not Progressing Goal: Will remain free from infection 11/04/2022 2206 by Raynelle Dick D, RN Outcome: Progressing 11/04/2022 2206 by Ronna Polio, RN Outcome: Not Progressing Goal: Diagnostic test results will improve 11/04/2022 2206 by Ronna Polio, RN Outcome: Progressing 11/04/2022 2206 by Raynelle Dick D, RN Outcome: Not Progressing Goal: Respiratory complications will improve 11/04/2022 2206 by Raynelle Dick D, RN Outcome: Progressing 11/04/2022 2206 by Raynelle Dick D, RN Outcome: Not Progressing Goal: Cardiovascular complication will be avoided 11/04/2022 2206 by Raynelle Dick D, RN Outcome: Progressing 11/04/2022 2206 by Ronna Polio, RN Outcome: Not Progressing   Problem: Activity: Goal: Risk for activity intolerance will decrease 11/04/2022 2206 by Raynelle Dick D, RN Outcome: Progressing 11/04/2022 2206 by Raynelle Dick D, RN Outcome: Not Progressing   Problem: Nutrition: Goal: Adequate nutrition will be maintained 11/04/2022 2206 by Ronna Polio, RN Outcome: Progressing 11/04/2022 2206 by Raynelle Dick D, RN Outcome: Not Progressing   Problem:  Coping: Goal: Level of anxiety will decrease 11/04/2022 2206 by Ronna Polio, RN Outcome: Progressing 11/04/2022 2206 by Raynelle Dick D, RN Outcome: Not Progressing   Problem: Elimination: Goal: Will not experience complications related to bowel motility 11/04/2022 2206 by Ronna Polio, RN Outcome: Progressing 11/04/2022 2206 by Raynelle Dick D, RN Outcome: Not Progressing Goal: Will not experience complications related to urinary retention 11/04/2022 2206 by Ronna Polio, RN Outcome: Progressing 11/04/2022 2206 by Ronna Polio, RN Outcome: Not Progressing   Problem: Pain Managment: Goal: General experience of comfort will improve 11/04/2022 2206 by Ronna Polio, RN Outcome: Progressing 11/04/2022 2206 by Raynelle Dick D, RN Outcome: Not Progressing   Problem: Safety: Goal: Ability to remain free from injury will improve 11/04/2022 2206 by Raynelle Dick D, RN Outcome: Progressing 11/04/2022 2206 by Raynelle Dick D, RN Outcome: Not Progressing   Problem: Skin Integrity: Goal: Risk for impaired skin integrity will decrease 11/04/2022 2206 by Raynelle Dick D, RN Outcome: Progressing 11/04/2022 2206 by Ronna Polio, RN Outcome: Not Progressing

## 2022-11-05 ENCOUNTER — Inpatient Hospital Stay (HOSPITAL_COMMUNITY): Payer: MEDICAID

## 2022-11-05 MED ORDER — HALOPERIDOL 1 MG PO TABS
2.0000 mg | ORAL_TABLET | Freq: Two times a day (BID) | ORAL | Status: DC
  Administered 2022-11-06 – 2022-11-18 (×17): 2 mg via ORAL
  Filled 2022-11-05 (×22): qty 2

## 2022-11-05 MED ORDER — HALOPERIDOL 1 MG PO TABS
1.5000 mg | ORAL_TABLET | Freq: Two times a day (BID) | ORAL | Status: DC
  Administered 2022-11-05: 1.5 mg via ORAL
  Filled 2022-11-05: qty 2

## 2022-11-05 MED ORDER — STERILE WATER FOR INJECTION IJ SOLN
INTRAMUSCULAR | Status: AC
  Filled 2022-11-05: qty 10

## 2022-11-05 MED ORDER — TETANUS-DIPHTH-ACELL PERTUSSIS 5-2.5-18.5 LF-MCG/0.5 IM SUSY
0.5000 mL | PREFILLED_SYRINGE | Freq: Once | INTRAMUSCULAR | Status: DC
  Filled 2022-11-05: qty 0.5

## 2022-11-05 MED ORDER — ENOXAPARIN SODIUM 40 MG/0.4ML IJ SOSY
40.0000 mg | PREFILLED_SYRINGE | Freq: Every day | INTRAMUSCULAR | Status: DC
  Administered 2022-11-07 – 2023-01-22 (×71): 40 mg via SUBCUTANEOUS
  Filled 2022-11-05 (×74): qty 0.4

## 2022-11-05 NOTE — Progress Notes (Addendum)
Came back to see patient as she had a fall with sitter present on 7/16 at 3PM.  Patient has a small lac above her eye brow on left.  Patient not cooperative enough to have stitches placed.  Initially attempted to glue shut but despite zyprexa and restraints, patient was not cooperative.  I was hesitant to use glue so close to the eye on an unpredictable/uncooperative patient.  Will also give tdap shot an unclear when last was.  Hold lovenox x 1 day and check CT head. Marlin Canary DO

## 2022-11-05 NOTE — TOC Progression Note (Signed)
Transition of Care [facility]) - Progression Note    Patient Details  Name: Natalie Brown MRN: [id number] Date of Birth: 1966/01/26  Transition of Care [facility]) CM/SW Contact  Inis Sizer, LCSW Phone Number: 11/05/2022, 10:14 AM  Clinical Narrative:    There are no discharge planning updates available at this time. CSW has been unable to obtain any bed offers from any facilities due to patient's age and mental health history.     Barriers to Discharge: Financial Resources, Inadequate or no insurance, Homeless with medical needs  Expected Discharge Plan and Services In-house Referral: Clinical Social Work   Post Acute Care Choice: Nursing Home Living arrangements for the past 2 months: Homeless [facility]) Expected Discharge Date: 12/24/21                                     Social Determinants of Health (SDOH) Interventions SDOH Screenings   Food Insecurity: No Food Insecurity (03/05/2022)  Housing: High Risk (03/21/2022)  Alcohol Screen: Low Risk  (09/06/2021)  Depression (PHQ2-9): Medium Risk (05/26/2020)  Tobacco Use: High Risk (03/21/2022)    Readmission Risk Interventions     No data to display

## 2022-11-05 NOTE — Progress Notes (Addendum)
PROGRESS NOTE  Natalie Brown XBJ:[id number] DOB: 14-Jul-1965   PCP: Lavinia Sharps, NP  Patient is from: Home  DOA: 12/24/2021 LOS: 314  Chief complaints No chief complaint on file.    Brief Narrative / Interim history: 57 year old with a history of bipolar disorder and chronic dementia with advanced cerebral atrophy who was admitted to the acute units 12/24/2021 following a preceding admission at [facility] 09/06/2021-12/24/2021. She was initially admitted to [facility] with acute mania and aggressive behaviors. On 12/24/2021 she became nearly catatonic and was transferred to the ER and ultimately admitted. During her acute hospital stay she has been evaluated by Neurology and Psychiatry. EEG has revealed no evidence of seizure activity. Repeat brain imaging was unremarkable. It was ultimately determined that her AMS was due to due progression of her severe underlying dementia.  The patient has been deemed medically stable and is awaiting a disposition. The goal is to place her within the memory care unit but this is proving difficult as she is homeless and has no insurance. Per TOC patient's mother plans to go to DSS to apply for special assistance Medicaid. No issues or events of note this week.    Subjective:  -fell on the 13th and again on the 16th despite having a sitter  Objective: Vitals:   11/03/22 2000 11/04/22 0007 11/04/22 0410 11/04/22 0843  BP: 112/69 (!) 86/54 96/67 (!) 81/58  Pulse: 87 65 (!) 53 64  Resp: 16  15 18   Temp: 98.5 F (36.9 C)  97.6 F (36.4 C)   TempSrc: Axillary  Axillary   SpO2: 100% 100% 100% 100%  Weight:      Height:        Examination:  In bed, non-verbal, drooling   Assessment and plan: Principal Problem:   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) Active Problems:   Delirium due to multiple etiologies, acute, hyperactive   Bipolar affective disorder, current episode manic with psychotic symptoms (HCC)   Sinus tachycardia    Tobacco abuse   Altered mental state   B12 deficiency   COVID-19 virus infection   Drooling   Essential hypertension  Acute Metabolic Encephalopathy on chronic progressive early onset Dementia: Nonverbal/noninteractive.  Somewhat lethargic Seen by neurology and psychiatry.   Extensive workup has been negative.  At this point there is no expectation of improvement.   Scheduled medication-Haldol Continue Zyprexa as needed Patient had a fall on 7/13   Bipolar Disorder Psych meds as above.-- ? Need to back off as appears overmedicated?   Vitamin B12 Deficiency:   Supplements ordered.    Essential hypertension  Not on any scheduled medications at this time.   .    CKD Stage IIIa Stable.   Tobacco Abuse Resolved.    Sinus Tachycardia Noted occasionally.  Patient became bradycardic after she was started on metoprolol which was subsequently stopped.  Echocardiogram was unremarkable.  TSH was normal.   Constipation/altered GI function On Bowel regimen.   Nutrition Status: Body mass index is 17.19 kg/m. Nutrition Problem: Altered GI function Etiology: constipation Signs/Symptoms: other (comment) (last BM 10 days ago) Interventions: Other (Comment) (scheduled bowel regimen per MD)   DVT prophylaxis:  enoxaparin (LOVENOX) injection 40 mg Start: 12/26/21 1200  Code Status: Full code Family Communication: None at bedside Level of care: Med-Surg Status is: Inpatient Remains inpatient appropriate because: Due to lack of safe disposition   Final disposition: Unknown  35 minutes with more than 50% spent in reviewing records, counseling patient/family  and coordinating care.   Sch Meds:  Scheduled Meds:  vitamin B-12  500 mcg Oral Daily   enoxaparin (LOVENOX) injection  40 mg Subcutaneous Q24H   feeding supplement  237 mL Oral Q24H   haloperidol  1.5 mg Oral BID   hydrocerin   Topical BID   multivitamin with minerals  1 tablet Oral Daily   polyethylene glycol  17 g  Oral Daily   senna-docusate  2 tablet Oral QHS   Continuous Infusions:   PRN Meds:.acetaminophen, alum & mag hydroxide-simeth, bisacodyl, fluticasone, hydrALAZINE, ipratropium-albuterol, lactulose, OLANZapine **OR** OLANZapine, ondansetron, mouth rinse  Antimicrobials: Anti-infectives (From admission, onward)    None      I have personally reviewed the following labs and images: CBC: No results for input(s): "WBC", "NEUTROABS", "HGB", "HCT", "MCV", "PLT" in the last 168 hours. BMP &GFR Recent Labs  Lab 10/31/22 1405  NA 141  K 4.7  CL 105  CO2 25  GLUCOSE 99  BUN 11  CREATININE 1.13*  CALCIUM 9.4   Estimated Creatinine Clearance: 42.9 mL/min (A) (by C-G formula based on SCr of 1.13 mg/dL (H)). Liver & Pancreas: No results for input(s): "AST", "ALT", "ALKPHOS", "BILITOT", "PROT", "ALBUMIN" in the last 168 hours.  No results for input(s): "LIPASE", "AMYLASE" in the last 168 hours. No results for input(s): "AMMONIA" in the last 168 hours.  Diabetic: No results for input(s): "HGBA1C" in the last 72 hours. No results for input(s): "GLUCAP" in the last 168 hours. Cardiac Enzymes: No results for input(s): "CKTOTAL", "CKMB", "CKMBINDEX", "TROPONINI" in the last 168 hours.  No results for input(s): "PROBNP" in the last 8760 hours. Coagulation Profile: No results for input(s): "INR", "PROTIME" in the last 168 hours. Thyroid Function Tests: No results for input(s): "TSH", "T4TOTAL", "FREET4", "T3FREE", "THYROIDAB" in the last 72 hours. Lipid Profile: No results for input(s): "CHOL", "HDL", "LDLCALC", "TRIG", "CHOLHDL", "LDLDIRECT" in the last 72 hours. Anemia Panel: No results for input(s): "VITAMINB12", "FOLATE", "FERRITIN", "TIBC", "IRON", "RETICCTPCT" in the last 72 hours.  Urine analysis:    Component Value Date/Time   COLORURINE YELLOW 06/28/2022 0630   APPEARANCEUR CLEAR 06/28/2022 0630   LABSPEC 1.021 06/28/2022 0630   PHURINE 5.0 06/28/2022 0630   GLUCOSEU  NEGATIVE 06/28/2022 0630   HGBUR NEGATIVE 06/28/2022 0630   BILIRUBINUR NEGATIVE 06/28/2022 0630   BILIRUBINUR neg 08/07/2016 1526   KETONESUR NEGATIVE 06/28/2022 0630   PROTEINUR NEGATIVE 06/28/2022 0630   UROBILINOGEN 2.0 (A) 08/07/2016 1526   UROBILINOGEN 1 12/30/2013 1216   NITRITE NEGATIVE 06/28/2022 0630   LEUKOCYTESUR NEGATIVE 06/28/2022 0630   Sepsis Labs: Invalid input(s): "PROCALCITONIN", "LACTICIDVEN"  Microbiology: No results found for this or any previous visit (from the past 240 hour(s)).  Radiology Studies: No results found.   Marlin Canary DO  If 7PM-7AM, please contact night-coverage [email or web address] 11/05/2022, 9:00 AM

## 2022-11-06 LAB — BASIC METABOLIC PANEL
Anion gap: 9 (ref 5–15)
BUN: 15 mg/dL (ref 6–20)
CO2: 25 mmol/L (ref 22–32)
Calcium: 8.7 mg/dL — ABNORMAL LOW (ref 8.9–10.3)
Chloride: 106 mmol/L (ref 98–111)
Creatinine, Ser: 0.98 mg/dL (ref 0.44–1.00)
GFR, Estimated: >60 mL/min (ref >60)
Glucose, Bld: 73 mg/dL (ref 70–99)
Potassium: 4.6 mmol/L (ref 3.5–5.1)
Sodium: 140 mmol/L (ref 135–145)

## 2022-11-06 NOTE — Progress Notes (Signed)
[facility]                                                                              Natalie Brown, is a 57 y.o. female, DOB - May 21, 1965, MVH:[id number] Admit date - 12/24/2021    Outpatient Primary MD for the patient is [name], Natalie Abrahams, NP  LOS - 315  days  No chief complaint on file.      Brief summary   57 year old with a history of bipolar disorder and chronic dementia with advanced cerebral atrophy who was admitted to the acute units 12/24/2021 following a preceding admission at [facility] 09/06/2021-12/24/2021. She was initially admitted to [facility] with acute mania and aggressive behaviors. On 12/24/2021 she became nearly catatonic and was transferred to the ER and ultimately admitted. During her acute hospital stay she has been evaluated by Neurology and Psychiatry. EEG has revealed no evidence of seizure activity. Repeat brain imaging was unremarkable. It was ultimately determined that her AMS was due to due progression of her severe underlying dementia.  The patient has been deemed medically stable and is awaiting a disposition. The goal is to place her within the memory care unit but this is proving difficult as she is homeless and has no insurance. Per TOC patient's mother plans to go to DSS to apply for special assistance Medicaid.   Assessment & Plan    Principal Problem: Acute Metabolic Encephalopathy on chronic progressive early onset Dementia: -Nonverbal/noninteractive.  Somewhat lethargic -Seen by neurology and psychiatry.   -Extensive workup has been negative.  At this point there is no expectation of improvement.   -continue scheduled Haldol, continue Zyprexa as needed -Patient had a fall on 7/13 and then on 7/16   Bipolar Disorder -Continue psych meds in general, may need to consult with psychiatry to reevaluate   Vitamin B12 Deficiency:   -Continue B12 supplementation   Essential hypertension  Not on any scheduled medications at this time.      CKD Stage IIIa -Creatinine stable   Tobacco Abuse Resolved.    Sinus Tachycardia Noted occasionally.  Patient became bradycardic after she was started on metoprolol which was subsequently stopped.  Echocardiogram was unremarkable.  TSH was normal.   Constipation/altered GI function -Continue bowel regimen   Underweight, protein calorie malnutrition Estimated body mass index is 16.9 kg/m as calculated from the following:   Height as of this encounter: 5\' 6"  (1.676 m).   Weight as of this encounter: 47.5 kg.  Code Status: Full code DVT Prophylaxis:  enoxaparin (LOVENOX) injection 40 mg Start: 11/07/22 1000   Level of Care: Level of care: Med-Surg Family Communication:  Disposition Plan:      Remains inpatient appropriate:  DTP, lack of safe disposition   Antimicrobials: None   Medications  vitamin B-12  500 mcg Oral Daily   [START ON 11/07/2022] enoxaparin (LOVENOX) injection  40 mg Subcutaneous Daily   feeding supplement  237 mL Oral Q24H   haloperidol  2 mg Oral BID   hydrocerin   Topical BID   multivitamin with minerals  1 tablet Oral Daily   polyethylene glycol  17 g Oral Daily  senna-docusate  2 tablet Oral QHS   Tdap  0.5 mL Intramuscular Once      Subjective:   Natalie Brown was seen and examined today.  Appears somewhat sedated and snoring, nonverbal, no acute issues overnight.  Unable to obtain ROS from the patient.  Sitter at the bedside Objective:   Vitals:   11/05/22 1900 11/05/22 1940 11/06/22 0629 11/06/22 0700  BP: 94/60 94/60 99/74    Pulse: 82 82 62   Resp: 18 18 16    Temp: 97.7 F (36.5 C) 98.7 F (37.1 C) 98 F (36.7 C)   TempSrc: Axillary Axillary Axillary   SpO2: 100% 97% 100%   Weight:    47.5 kg  Height:        Intake/Output Summary (Last 24 hours) at 11/06/2022 1207 Last data filed at 11/06/2022 1000 Gross per 24 hour  Intake 340 ml  Output --  Net 340 ml     Wt Readings from Last 3 Encounters:  11/06/22 47.5 kg   12/24/21 72 kg  09/05/21 72.6 kg     Exam General: Nonverbal, snoring Cardiovascular: S1 S2 auscultated,  RRR Respiratory: CTAB  Gastrointestinal: Soft, nontender, nondistended, + bowel sounds Ext: no pedal edema bilaterally Neuro: unable to assess   Data Reviewed:  I have personally reviewed following labs    CBC Lab Results  Component Value Date   WBC 4.5 10/11/2022   RBC 4.29 10/11/2022   HGB 12.3 10/11/2022   HCT 36.7 10/11/2022   MCV 85.5 10/11/2022   MCH 28.7 10/11/2022   PLT 244 10/11/2022   MCHC 33.5 10/11/2022   RDW 14.1 10/11/2022   LYMPHSABS 1.4 09/04/2022   MONOABS 0.3 09/04/2022   EOSABS 0.1 09/04/2022   BASOSABS 0.0 09/04/2022     Last metabolic panel Lab Results  Component Value Date   NA 140 11/06/2022   K 4.6 11/06/2022   CL 106 11/06/2022   CO2 25 11/06/2022   BUN 15 11/06/2022   CREATININE 0.98 11/06/2022   GLUCOSE 73 11/06/2022   GFRNONAA >60 11/06/2022   GFRAA 65 09/19/2017   CALCIUM 8.7 (L) 11/06/2022   PHOS 3.5 10/28/2022   PROT 6.6 10/28/2022   ALBUMIN 3.5 10/28/2022   LABGLOB 2.8 06/21/2020   AGRATIO 1.5 06/21/2020   BILITOT 0.5 10/28/2022   ALKPHOS 57 10/28/2022   AST 28 10/28/2022   ALT 24 10/28/2022   ANIONGAP 9 11/06/2022    CBG (last 3)  No results for input(s): "GLUCAP" in the last 72 hours.    Coagulation Profile: No results for input(s): "INR", "PROTIME" in the last 168 hours.   Radiology Studies: I have personally reviewed the imaging studies  CT HEAD WO CONTRAST ( )  Result Date: 11/05/2022 CLINICAL DATA:  Head trauma, minor, normal mental status (Age 2-64y) Head trauma, moderate-severe EXAM: CT HEAD WITHOUT CONTRAST TECHNIQUE: Contiguous axial images were obtained from the base of the skull through the vertex without intravenous contrast. RADIATION DOSE REDUCTION: This exam was performed according to the departmental dose-optimization program which includes automated exposure control, adjustment of the mA  and/or kV according to patient size and/or use of iterative reconstruction technique. COMPARISON:  CT head 10/11/2022 FINDINGS: Brain: Cerebral ventricle sizes are concordant with the degree of cerebral volume loss. Patchy and confluent areas of decreased attenuation are noted throughout the deep and periventricular white matter of the cerebral hemispheres bilaterally, compatible with chronic microvascular ischemic disease. No evidence of large-territorial acute infarction. No parenchymal hemorrhage. No mass lesion. No extra-axial collection.  No mass effect or midline shift. No hydrocephalus. Basilar cisterns are patent. Vascular: No hyperdense vessel. Skull: No acute fracture or focal lesion. Sinuses/Orbits: Paranasal sinuses and mastoid air cells are clear. The orbits are unremarkable. Other: None. IMPRESSION: No acute intracranial abnormality. Electronically Signed   By: Tish Frederickson M.D.   On: 11/05/2022 21:23       Damika Harmon M.D. [facility] 11/06/2022, 12:07 PM  Available via Epic secure chat 7am-7pm After 7 pm, please refer to night coverage provider listed on amion.

## 2022-11-07 LAB — GLUCOSE, CAPILLARY: Glucose-Capillary: 95 mg/dL (ref 70–99)

## 2022-11-07 NOTE — Plan of Care (Signed)

## 2022-11-07 NOTE — TOC Progression Note (Signed)
Transition of Care [facility]) - Progression Note    Patient Details  Name: Natalie Brown MRN: [id number] Date of Birth: July 25, 1965  [facility]) CM/SW Contact  Inis Sizer, LCSW Phone Number: 11/07/2022, 10:18 AM  Clinical Narrative:    Per [employer], patient's tailored care management agency is [facility] [phone]).  CSW spoke Diplomatic Services operational officer with at [facility] who states patient's tailored care manager is Tiffany @ [phone].  CSW attempted to reach Tiffany via phone - no answer so a voicemail was left requesting a return call.    Barriers to Discharge: Financial Resources, Inadequate or no insurance, Homeless with medical needs  Expected Discharge Plan and Services In-house Referral: Clinical Social Work   Post Acute Care Choice: Nursing Home Living arrangements for the past 2 months: Homeless [facility]) Expected Discharge Date: 12/24/21                                     Social Determinants of Health (SDOH) Interventions SDOH Screenings   Food Insecurity: No Food Insecurity (03/05/2022)  Housing: High Risk (03/21/2022)  Alcohol Screen: Low Risk  (09/06/2021)  Depression (PHQ2-9): Medium Risk (05/26/2020)  Tobacco Use: High Risk (03/21/2022)    Readmission Risk Interventions     No data to display

## 2022-11-07 NOTE — Progress Notes (Signed)
[facility]                                                                              Natalie Brown, is a 57 y.o. female, DOB - 02-Jul-1965, AOZ:[id number] Admit date - 12/24/2021    Outpatient Primary MD for the patient is [name], [name], NP  LOS - 316  days  No chief complaint on file.      Brief summary   57 year old with a history of bipolar disorder and chronic dementia with advanced cerebral atrophy who was admitted to the acute units 12/24/2021 following a preceding admission at [facility] 09/06/2021-12/24/2021. She was initially admitted to [facility] with acute mania and aggressive behaviors. On 12/24/2021 she became nearly catatonic and was transferred to the ER and ultimately admitted. During her acute hospital stay she has been evaluated by Neurology and Psychiatry. EEG has revealed no evidence of seizure activity. Repeat brain imaging was unremarkable. It was ultimately determined that her AMS was due to due progression of her severe underlying dementia.  The patient has been deemed medically stable and is awaiting a disposition. The goal is to place her within the memory care unit but this is proving difficult as she is homeless and has no insurance. Per TOC patient's mother plans to go to DSS to apply for special assistance Medicaid.   Assessment & Plan    Principal Problem: Acute Metabolic Encephalopathy on chronic progressive early onset Dementia: -Nonverbal/noninteractive.  -Seen by neurology and psychiatry.   -Extensive workup has been negative.  At this point there is no expectation of improvement.   -continue scheduled Haldol, continue Zyprexa as needed -Patient had a fall on 7/13 and then on 7/16 -Today much more alert and awake, feeding by herself with no help.  Still nonverbal and noninteractive.   Bipolar Disorder -Continue psych meds   Vitamin B12 Deficiency:   -Continue B12 supplementation   Essential hypertension  Not on any scheduled  medications at this time.     CKD Stage IIIa -Creatinine stable   Tobacco Abuse Resolved.    Sinus Tachycardia Noted occasionally.  Patient became bradycardic after she was started on metoprolol which was subsequently stopped.  Echocardiogram was unremarkable.  TSH was normal.   Constipation/altered GI function -Continue bowel regimen   Underweight, protein calorie malnutrition Estimated body mass index is 17.29 kg/m as calculated from the following:   Height as of this encounter: 5\' 6"  (1.676 m).   Weight as of this encounter: 48.6 kg.  Code Status: Full code DVT Prophylaxis:  enoxaparin (LOVENOX) injection 40 mg Start: 11/07/22 1000   Level of Care: Level of care: Med-Surg Family Communication:  Disposition Plan:      Remains inpatient appropriate:  DTP, lack of safe disposition   Antimicrobials: None   Medications  vitamin B-12  500 mcg Oral Daily   enoxaparin (LOVENOX) injection  40 mg Subcutaneous Daily   feeding supplement  237 mL Oral Q24H   haloperidol  2 mg Oral BID   hydrocerin   Topical BID   multivitamin with minerals  1 tablet Oral Daily   polyethylene glycol  17 g Oral  Daily   senna-docusate  2 tablet Oral QHS   Tdap  0.5 mL Intramuscular Once      Subjective:   Natalie Brown was seen and examined today.  Appears close to her baseline, alert and awake and eating breakfast without any difficulty.  Still no interaction, nonverbal, does not make eye contact either.  Sitter at the bedside.  Objective:   Vitals:   11/06/22 2043 11/07/22 0325 11/07/22 0346 11/07/22 1350  BP: 99/71 (!) 81/50 108/67 (!) 157/109  Pulse: (!) 58 (!) 51 63 (!) 133  Resp: 18  16 16   Temp: 97.7 F (36.5 C) 97.8 F (36.6 C) 97.8 F (36.6 C) 98.5 F (36.9 C)  TempSrc: Oral Axillary Axillary Oral  SpO2: 100% 100% 100% 98%  Weight:  48.6 kg    Height:        Intake/Output Summary (Last 24 hours) at 11/07/2022 1428 Last data filed at 11/07/2022 0900 Gross per 24 hour   Intake 80 ml  Output --  Net 80 ml     Wt Readings from Last 3 Encounters:  11/07/22 48.6 kg  12/24/21 72 kg  09/05/21 72.6 kg    Physical Exam General: Alert, awake, nonverbal not interactive Cardiovascular: S1 S2 clear, RRR.  Respiratory: CTAB, no wheezing Gastrointestinal: Soft, nontender, nondistended, NBS Ext: no pedal edema bilaterally Neuro: does not follow commands Psych: not interactive, nonverbal   Data Reviewed:  I have personally reviewed following labs    CBC Lab Results  Component Value Date   WBC 4.5 10/11/2022   RBC 4.29 10/11/2022   HGB 12.3 10/11/2022   HCT 36.7 10/11/2022   MCV 85.5 10/11/2022   MCH 28.7 10/11/2022   PLT 244 10/11/2022   MCHC 33.5 10/11/2022   RDW 14.1 10/11/2022   LYMPHSABS 1.4 09/04/2022   MONOABS 0.3 09/04/2022   EOSABS 0.1 09/04/2022   BASOSABS 0.0 09/04/2022     Last metabolic panel Lab Results  Component Value Date   NA 140 11/06/2022   K 4.6 11/06/2022   CL 106 11/06/2022   CO2 25 11/06/2022   BUN 15 11/06/2022   CREATININE 0.98 11/06/2022   GLUCOSE 73 11/06/2022   GFRNONAA >60 11/06/2022   GFRAA 65 09/19/2017   CALCIUM 8.7 (L) 11/06/2022   PHOS 3.5 10/28/2022   PROT 6.6 10/28/2022   ALBUMIN 3.5 10/28/2022   LABGLOB 2.8 06/21/2020   AGRATIO 1.5 06/21/2020   BILITOT 0.5 10/28/2022   ALKPHOS 57 10/28/2022   AST 28 10/28/2022   ALT 24 10/28/2022   ANIONGAP 9 11/06/2022    CBG (last 3)  Recent Labs    11/06/22 2211  GLUCAP 95      Coagulation Profile: No results for input(s): "INR", "PROTIME" in the last 168 hours.   Radiology Studies: I have personally reviewed the imaging studies  CT HEAD WO CONTRAST ( )  Result Date: 11/05/2022 CLINICAL DATA:  Head trauma, minor, normal mental status (Age 58-64y) Head trauma, moderate-severe EXAM: CT HEAD WITHOUT CONTRAST TECHNIQUE: Contiguous axial images were obtained from the base of the skull through the vertex without intravenous contrast. RADIATION  DOSE REDUCTION: This exam was performed according to the departmental dose-optimization program which includes automated exposure control, adjustment of the mA and/or kV according to patient size and/or use of iterative reconstruction technique. COMPARISON:  CT head 10/11/2022 FINDINGS: Brain: Cerebral ventricle sizes are concordant with the degree of cerebral volume loss. Patchy and confluent areas of decreased attenuation are noted throughout the deep and periventricular  white matter of the cerebral hemispheres bilaterally, compatible with chronic microvascular ischemic disease. No evidence of large-territorial acute infarction. No parenchymal hemorrhage. No mass lesion. No extra-axial collection. No mass effect or midline shift. No hydrocephalus. Basilar cisterns are patent. Vascular: No hyperdense vessel. Skull: No acute fracture or focal lesion. Sinuses/Orbits: Paranasal sinuses and mastoid air cells are clear. The orbits are unremarkable. Other: None. IMPRESSION: No acute intracranial abnormality. Electronically Signed   By: Tish Frederickson M.D.   On: 11/05/2022 21:23       Deloy Archey M.D. [facility] 11/07/2022, 2:28 PM  Available via Epic secure chat 7am-7pm After 7 pm, please refer to night coverage provider listed on amion.

## 2022-11-08 NOTE — Progress Notes (Signed)
[facility]                                                                              Natalie Brown, is a 57 y.o. female, DOB - 1965-05-03, XLK:[id number] Admit date - 12/24/2021    Outpatient Primary MD for the patient is [name], [name], NP  LOS - 317  days  No chief complaint on file.      Brief summary   57 year old with a history of bipolar disorder and chronic dementia with advanced cerebral atrophy who was admitted to the acute units 12/24/2021 following a preceding admission at [facility] 09/06/2021-12/24/2021. She was initially admitted to [facility] with acute mania and aggressive behaviors. On 12/24/2021 she became nearly catatonic and was transferred to the ER and ultimately admitted. During her acute hospital stay she has been evaluated by Neurology and Psychiatry. EEG has revealed no evidence of seizure activity. Repeat brain imaging was unremarkable. It was ultimately determined that her AMS was due to due progression of her severe underlying dementia.  The patient has been deemed medically stable and is awaiting a disposition. The goal is to place her within the memory care unit but this is proving difficult as she is homeless and has no insurance. Per TOC patient's mother plans to go to DSS to apply for special assistance Medicaid.   Assessment & Plan    Principal Problem: Acute Metabolic Encephalopathy on chronic progressive early onset Dementia: -Nonverbal/noninteractive.  -Seen by neurology and psychiatry.   -Extensive workup has been negative.  At this point there is no expectation of improvement.   -continue scheduled Haldol, continue Zyprexa as needed -Patient had a fall on 7/13 and then on 7/16 -Non verbal and non interactive but alert and awake    Bipolar Disorder -Continue psych meds   Vitamin B12 Deficiency:   -Continue B12 supplementation   Essential hypertension  Not on any scheduled medications at this time.     CKD Stage  IIIa -Creatinine stable   Tobacco Abuse Resolved.    Sinus Tachycardia Noted occasionally.  Patient became bradycardic after she was started on metoprolol which was subsequently stopped.  Echocardiogram was unremarkable.  TSH was normal.   Constipation/altered GI function -Continue bowel regimen   Underweight, protein calorie malnutrition Estimated body mass index is 17.68 kg/m as calculated from the following:   Height as of this encounter: 5\' 6"  (1.676 m).   Weight as of this encounter: 49.7 kg.  Code Status: Full code DVT Prophylaxis:  enoxaparin (LOVENOX) injection 40 mg Start: 11/07/22 1000   Level of Care: Level of care: Med-Surg Family Communication: updated patient's mother on phone  Disposition Plan:      Remains inpatient appropriate:  DTP, lack of safe disposition   Antimicrobials: None   Medications  vitamin B-12  500 mcg Oral Daily   enoxaparin (LOVENOX) injection  40 mg Subcutaneous Daily   feeding supplement  237 mL Oral Q24H   haloperidol  2 mg Oral BID   hydrocerin   Topical BID   multivitamin with minerals  1 tablet Oral Daily   polyethylene glycol  17 g Oral Daily  senna-docusate  2 tablet Oral QHS   Tdap  0.5 mL Intramuscular Once      Subjective:   Linden Mikes was seen and examined today.  Alert and awake, trying to get out of bed this AM. No acute issues. Was redirected back to bed and calm.  Appears close to her baseline.   Vitals:   11/07/22 0346 11/07/22 1350 11/07/22 1907 11/08/22 0318  BP: 108/67 (!) 157/109 (!) 91/58 104/66  Pulse: 63 (!) 133 78 65  Resp: 16 16 16 16   Temp: 97.8 F (36.6 C) 98.5 F (36.9 C) 98.4 F (36.9 C) 98.4 F (36.9 C)  TempSrc: Axillary Oral Axillary Axillary  SpO2: 100% 98% 100% 100%  Weight:    49.7 kg  Height:        Intake/Output Summary (Last 24 hours) at 11/08/2022 1306 Last data filed at 11/07/2022 1830 Gross per 24 hour  Intake 120 ml  Output 0 ml  Net 120 ml     Wt Readings from  Last 3 Encounters:  11/08/22 49.7 kg  12/24/21 72 kg  09/05/21 72.6 kg   Physical Exam General: non verbal  Cardiovascular: S1 S2 clear, RRR.  Respiratory: CTAB Gastrointestinal: Soft, nontender, nondistended, NBS Ext: no pedal edema bilaterally Neuro: moving all 4 extremities Psych: Not interactive    Data Reviewed:  I have personally reviewed following labs    CBC Lab Results  Component Value Date   WBC 4.5 10/11/2022   RBC 4.29 10/11/2022   HGB 12.3 10/11/2022   HCT 36.7 10/11/2022   MCV 85.5 10/11/2022   MCH 28.7 10/11/2022   PLT 244 10/11/2022   MCHC 33.5 10/11/2022   RDW 14.1 10/11/2022   LYMPHSABS 1.4 09/04/2022   MONOABS 0.3 09/04/2022   EOSABS 0.1 09/04/2022   BASOSABS 0.0 09/04/2022     Last metabolic panel Lab Results  Component Value Date   NA 140 11/06/2022   K 4.6 11/06/2022   CL 106 11/06/2022   CO2 25 11/06/2022   BUN 15 11/06/2022   CREATININE 0.98 11/06/2022   GLUCOSE 73 11/06/2022   GFRNONAA >60 11/06/2022   GFRAA 65 09/19/2017   CALCIUM 8.7 (L) 11/06/2022   PHOS 3.5 10/28/2022   PROT 6.6 10/28/2022   ALBUMIN 3.5 10/28/2022   LABGLOB 2.8 06/21/2020   AGRATIO 1.5 06/21/2020   BILITOT 0.5 10/28/2022   ALKPHOS 57 10/28/2022   AST 28 10/28/2022   ALT 24 10/28/2022   ANIONGAP 9 11/06/2022    CBG (last 3)  Recent Labs    11/06/22 2211  GLUCAP 95      Coagulation Profile: No results for input(s): "INR", "PROTIME" in the last 168 hours.   Radiology Studies: I have personally reviewed the imaging studies  No results found.     Thad Ranger M.D. [facility] 11/08/2022, 1:06 PM  Available via Epic secure chat 7am-7pm After 7 pm, please refer to night coverage provider listed on amion.

## 2022-11-08 NOTE — Plan of Care (Signed)
  Problem: Pain Managment: Goal: General experience of comfort will improve Outcome: Progressing   Problem: Safety: Goal: Ability to remain free from injury will improve Outcome: Progressing   Problem: Skin Integrity: Goal: Risk for impaired skin integrity will decrease Outcome: Progressing   Problem: Elimination: Goal: Will not experience complications related to bowel motility Outcome: Progressing

## 2022-11-09 NOTE — Progress Notes (Signed)
[facility]                                                                              Natalie Brown, is a 57 y.o. female, DOB - 03-12-1966, ZOX:[id number] Admit date - 12/24/2021    Outpatient Primary MD for the patient is [name], [name], NP  LOS - 318  days  No chief complaint on file.      Brief summary   57 year old with a history of bipolar disorder and chronic dementia with advanced cerebral atrophy who was admitted to the acute units 12/24/2021 following a preceding admission at [facility] 09/06/2021-12/24/2021. She was initially admitted to [facility] with acute mania and aggressive behaviors. On 12/24/2021 she became nearly catatonic and was transferred to the ER and ultimately admitted. During her acute hospital stay she has been evaluated by Neurology and Psychiatry. EEG has revealed no evidence of seizure activity. Repeat brain imaging was unremarkable. It was ultimately determined that her AMS was due to due progression of her severe underlying dementia.  The patient has been deemed medically stable and is awaiting a disposition. The goal is to place her within the memory care unit but this is proving difficult as she is homeless and has no insurance. Per TOC patient's mother plans to go to DSS to apply for special assistance Medicaid.   Assessment & Plan    Principal Problem: Acute Metabolic Encephalopathy on chronic progressive early onset Dementia: -Nonverbal/noninteractive.  -Seen by neurology and psychiatry.   -Extensive workup has been negative.  At this point there is no expectation of improvement.   -continue scheduled Haldol, continue Zyprexa as needed -Patient had a fall on 7/13 and then on 7/16 -Nonverbal, no acute issues   Bipolar Disorder -Continue psych meds   Vitamin B12 Deficiency:   -Continue B12 supplementation   Essential hypertension  Not on any scheduled medications at this time.     CKD Stage IIIa -Creatinine stable   Tobacco  Abuse Resolved.    Sinus Tachycardia Noted occasionally.  Patient became bradycardic after she was started on metoprolol which was subsequently stopped.  Echocardiogram was unremarkable.  TSH was normal.   Constipation/altered GI function -Continue bowel regimen   Underweight, protein calorie malnutrition Estimated body mass index is 17.68 kg/m as calculated from the following:   Height as of this encounter: 5\' 6"  (1.676 m).   Weight as of this encounter: 49.7 kg.  Code Status: Full code DVT Prophylaxis:  enoxaparin (LOVENOX) injection 40 mg Start: 11/07/22 1000   Level of Care: Level of care: Med-Surg Family Communication: updated patient's mother on phone  Disposition Plan:      Remains inpatient appropriate:  DTP, lack of safe disposition   Antimicrobials: None   Medications  vitamin B-12  500 mcg Oral Daily   enoxaparin (LOVENOX) injection  40 mg Subcutaneous Daily   feeding supplement  237 mL Oral Q24H   haloperidol  2 mg Oral BID   hydrocerin   Topical BID   multivitamin with minerals  1 tablet Oral Daily   polyethylene glycol  17 g Oral Daily   senna-docusate  2 tablet Oral  QHS   Tdap  0.5 mL Intramuscular Once      Subjective:   Natalie Brown was seen and examined today.  Alert and awake, no acute issues.  Appears close to her baseline.  Vitals:   11/08/22 1958 11/09/22 0502 11/09/22 0648 11/09/22 0747  BP: 113/68  103/86 108/64  Pulse: 92  89 98  Resp: 16  16 17   Temp:      TempSrc:      SpO2:   100% 100%  Weight:  49.7 kg    Height:        Intake/Output Summary (Last 24 hours) at 11/09/2022 1238 Last data filed at 11/09/2022 1000 Gross per 24 hour  Intake 100 ml  Output --  Net 100 ml     Wt Readings from Last 3 Encounters:  11/09/22 49.7 kg  12/24/21 72 kg  09/05/21 72.6 kg    Physical Exam General: Alert, awake, nonverbal Cardiovascular: S1 S2 clear, RRR.  Respiratory: CTAB Gastrointestinal: Soft, nontender, nondistended,  NBS Ext: no pedal edema bilaterally Neuro: moving all 4 extremities Psych: not interactive   Data Reviewed:  I have personally reviewed following labs    CBC Lab Results  Component Value Date   WBC 4.5 10/11/2022   RBC 4.29 10/11/2022   HGB 12.3 10/11/2022   HCT 36.7 10/11/2022   MCV 85.5 10/11/2022   MCH 28.7 10/11/2022   PLT 244 10/11/2022   MCHC 33.5 10/11/2022   RDW 14.1 10/11/2022   LYMPHSABS 1.4 09/04/2022   MONOABS 0.3 09/04/2022   EOSABS 0.1 09/04/2022   BASOSABS 0.0 09/04/2022     Last metabolic panel Lab Results  Component Value Date   NA 140 11/06/2022   K 4.6 11/06/2022   CL 106 11/06/2022   CO2 25 11/06/2022   BUN 15 11/06/2022   CREATININE 0.98 11/06/2022   GLUCOSE 73 11/06/2022   GFRNONAA >60 11/06/2022   GFRAA 65 09/19/2017   CALCIUM 8.7 (L) 11/06/2022   PHOS 3.5 10/28/2022   PROT 6.6 10/28/2022   ALBUMIN 3.5 10/28/2022   LABGLOB 2.8 06/21/2020   AGRATIO 1.5 06/21/2020   BILITOT 0.5 10/28/2022   ALKPHOS 57 10/28/2022   AST 28 10/28/2022   ALT 24 10/28/2022   ANIONGAP 9 11/06/2022    CBG (last 3)  Recent Labs    11/06/22 2211  GLUCAP 95      Coagulation Profile: No results for input(s): "INR", "PROTIME" in the last 168 hours.   Radiology Studies: I have personally reviewed the imaging studies  No results found.     Thad Ranger M.D. [facility] 11/09/2022, 12:38 PM  Available via Epic secure chat 7am-7pm After 7 pm, please refer to night coverage provider listed on amion.

## 2022-11-09 NOTE — Plan of Care (Signed)
  Problem: Clinical Measurements: Goal: Will remain free from infection Outcome: Progressing Goal: Diagnostic test results will improve Outcome: Progressing Goal: Respiratory complications will improve Outcome: Progressing Goal: Cardiovascular complication will be avoided Outcome: Progressing   Problem: Activity: Goal: Risk for activity intolerance will decrease Outcome: Progressing   Problem: Nutrition: Goal: Adequate nutrition will be maintained Outcome: Progressing   Problem: Coping: Goal: Level of anxiety will decrease Outcome: Progressing   Problem: Elimination: Goal: Will not experience complications related to bowel motility Outcome: Progressing Goal: Will not experience complications related to urinary retention Outcome: Progressing   Problem: Pain Managment: Goal: General experience of comfort will improve Outcome: Progressing   Problem: Safety: Goal: Ability to remain free from injury will improve Outcome: Progressing   Problem: Skin Integrity: Goal: Risk for impaired skin integrity will decrease Outcome: Progressing   

## 2022-11-10 NOTE — TOC Progression Note (Signed)
Transition of Care [facility]) - Progression Note    Patient Details  Name: Natalie Brown MRN: [id number] Date of Birth: Jun 06, 1965  Transition of Care [facility]) CM/SW Contact  Inis Sizer, LCSW Phone Number: 11/10/2022, 12:36 PM  Clinical Narrative:    There are no discharge planning updates available at this time.  CSW will attempt to contact patient's tailored care manager at [facility] @ [phone]) tomorrow during normal business hours.    Barriers to Discharge: Financial Resources, Inadequate or no insurance, Homeless with medical needs  Expected Discharge Plan and Services In-house Referral: Clinical Social Work   Post Acute Care Choice: Nursing Home Living arrangements for the past 2 months: Homeless [facility]) Expected Discharge Date: 12/24/21                                     Social Determinants of Health (SDOH) Interventions SDOH Screenings   Food Insecurity: No Food Insecurity (03/05/2022)  Housing: High Risk (03/21/2022)  Alcohol Screen: Low Risk  (09/06/2021)  Depression (PHQ2-9): Medium Risk (05/26/2020)  Tobacco Use: High Risk (03/21/2022)    Readmission Risk Interventions     No data to display

## 2022-11-10 NOTE — Progress Notes (Signed)
[facility]                                                                              Natalie Brown, is a 57 y.o. female, DOB - 1965-06-22, RUE:[id number] Admit date - 12/24/2021    Outpatient Primary MD for the patient is [name], [name], NP  LOS - 319  days  No chief complaint on file.      Brief summary   57 year old with a history of bipolar disorder and chronic dementia with advanced cerebral atrophy who was admitted to the acute units 12/24/2021 following a preceding admission at [facility] 09/06/2021-12/24/2021. She was initially admitted to [facility] with acute mania and aggressive behaviors. On 12/24/2021 she became nearly catatonic and was transferred to the ER and ultimately admitted. During her acute hospital stay she has been evaluated by Neurology and Psychiatry. EEG has revealed no evidence of seizure activity. Repeat brain imaging was unremarkable. It was ultimately determined that her AMS was due to due progression of her severe underlying dementia.  The patient has been deemed medically stable and is awaiting a disposition. The goal is to place her within the memory care unit but this is proving difficult as she is homeless and has no insurance. Per TOC patient's mother plans to go to DSS to apply for special assistance Medicaid.   Assessment & Plan    Principal Problem: Acute Metabolic Encephalopathy on chronic progressive early onset Dementia: -Nonverbal/noninteractive.  -Seen by neurology and psychiatry.   -Extensive workup has been negative.  At this point there is no expectation of improvement.   -continue scheduled Haldol, continue Zyprexa as needed -Patient had a fall on 7/13 and then on 7/16 -no acute issues    Bipolar Disorder -Continue psych meds   Vitamin B12 Deficiency:   -Continue B12 supplementation   Essential hypertension  Not on any scheduled medications at this time.     CKD Stage IIIa -Creatinine stable   Tobacco  Abuse Resolved.    Sinus Tachycardia Noted occasionally.  Patient became bradycardic after she was started on metoprolol which was subsequently stopped.  Echocardiogram was unremarkable.  TSH was normal.   Constipation/altered GI function -Continue bowel regimen   Underweight, protein calorie malnutrition Estimated body mass index is 17.68 kg/m as calculated from the following:   Height as of this encounter: 5\' 6"  (1.676 m).   Weight as of this encounter: 49.7 kg.  Code Status: Full code DVT Prophylaxis:  enoxaparin (LOVENOX) injection 40 mg Start: 11/07/22 1000   Level of Care: Level of care: Med-Surg Family Communication: updated patient's mother on phone  Disposition Plan:      Remains inpatient appropriate:  DTP, lack of safe disposition   Antimicrobials: None   Medications  vitamin B-12  500 mcg Oral Daily   enoxaparin (LOVENOX) injection  40 mg Subcutaneous Daily   feeding supplement  237 mL Oral Q24H   haloperidol  2 mg Oral BID   hydrocerin   Topical BID   multivitamin with minerals  1 tablet Oral Daily   polyethylene glycol  17 g Oral Daily   senna-docusate  2 tablet Oral  QHS   Tdap  0.5 mL Intramuscular Once      Subjective:   Natalie Brown was seen and examined today.  No acute issues, alert and awake.  Vitals:   11/09/22 0648 11/09/22 0747 11/09/22 2034 11/10/22 0551  BP: 103/86 108/64 (!) 143/124 (!) 147/115  Pulse: 89 98 79 88  Resp: 16 17 16 19   Temp:   97.7 F (36.5 C) (!) 97.5 F (36.4 C)  TempSrc:   Oral Axillary  SpO2: 100% 100% 100% 96%  Weight:      Height:        Intake/Output Summary (Last 24 hours) at 11/10/2022 1153 Last data filed at 11/10/2022 0900 Gross per 24 hour  Intake 50 ml  Output --  Net 50 ml     Wt Readings from Last 3 Encounters:  11/09/22 49.7 kg  12/24/21 72 kg  09/05/21 72.6 kg   Physical Exam General: Alert and awake Cardiovascular: S1 S2 clear, RRR.  Respiratory: CTAB, no wheezing Gastrointestinal:  Soft, nontender, nondistended, NBS Ext: no pedal edema bilaterally Psych: Nonverbal, not interactive    Data Reviewed:  I have personally reviewed following labs    CBC Lab Results  Component Value Date   WBC 4.5 10/11/2022   RBC 4.29 10/11/2022   HGB 12.3 10/11/2022   HCT 36.7 10/11/2022   MCV 85.5 10/11/2022   MCH 28.7 10/11/2022   PLT 244 10/11/2022   MCHC 33.5 10/11/2022   RDW 14.1 10/11/2022   LYMPHSABS 1.4 09/04/2022   MONOABS 0.3 09/04/2022   EOSABS 0.1 09/04/2022   BASOSABS 0.0 09/04/2022     Last metabolic panel Lab Results  Component Value Date   NA 140 11/06/2022   K 4.6 11/06/2022   CL 106 11/06/2022   CO2 25 11/06/2022   BUN 15 11/06/2022   CREATININE 0.98 11/06/2022   GLUCOSE 73 11/06/2022   GFRNONAA >60 11/06/2022   GFRAA 65 09/19/2017   CALCIUM 8.7 (L) 11/06/2022   PHOS 3.5 10/28/2022   PROT 6.6 10/28/2022   ALBUMIN 3.5 10/28/2022   LABGLOB 2.8 06/21/2020   AGRATIO 1.5 06/21/2020   BILITOT 0.5 10/28/2022   ALKPHOS 57 10/28/2022   AST 28 10/28/2022   ALT 24 10/28/2022   ANIONGAP 9 11/06/2022    CBG (last 3)  No results for input(s): "GLUCAP" in the last 72 hours.     Coagulation Profile: No results for input(s): "INR", "PROTIME" in the last 168 hours.   Radiology Studies: I have personally reviewed the imaging studies  No results found.     Thad Ranger M.D. [facility] 11/10/2022, 11:53 AM  Available via Epic secure chat 7am-7pm After 7 pm, please refer to night coverage provider listed on amion.

## 2022-11-11 NOTE — Progress Notes (Signed)
[facility]                                                                              Natalie Brown, is a 57 y.o. female, DOB - 1965/10/27, [id number] Admit date - 12/24/2021    Outpatient Primary MD for the patient is [name], [name], NP  LOS - 320  days  No chief complaint on file.      Brief summary   57 year old with a history of bipolar disorder and chronic dementia with advanced cerebral atrophy who was admitted to the acute units 12/24/2021 following a preceding admission at [facility] 09/06/2021-12/24/2021. She was initially admitted to [facility] with acute mania and aggressive behaviors. On 12/24/2021 she became nearly catatonic and was transferred to the ER and ultimately admitted. During her acute hospital stay she has been evaluated by Neurology and Psychiatry. EEG has revealed no evidence of seizure activity. Repeat brain imaging was unremarkable. It was ultimately determined that her AMS was due to due progression of her severe underlying dementia.  The patient has been deemed medically stable and is awaiting a disposition. The goal is to place her within the memory care unit but this is proving difficult as she is homeless and has no insurance. Per TOC patient's mother plans to go to DSS to apply for special assistance Medicaid.   Assessment & Plan    Principal Problem: Acute Metabolic Encephalopathy on chronic progressive early onset Dementia: -Nonverbal/noninteractive.  -Seen by neurology and psychiatry.   -Extensive workup has been negative.  At this point there is no expectation of improvement.   -continue scheduled Haldol, continue Zyprexa as needed -Patient had a fall on 7/13 and then on 7/16 -Alert, awake however nonverbal and non interactive.  Eating breakfast, feeding herself without assistance.   Bipolar Disorder -Continue psych meds   Vitamin B12 Deficiency:   -Continue B12 supplementation   Essential hypertension  Not on any scheduled  medications at this time.     CKD Stage IIIa -Creatinine stable   Tobacco Abuse Resolved.    Sinus Tachycardia Noted occasionally.  Patient became bradycardic after she was started on metoprolol which was subsequently stopped.  Echocardiogram was unremarkable.  TSH was normal.   Constipation/altered GI function -Continue bowel regimen   Underweight, protein calorie malnutrition Estimated body mass index is 17.68 kg/m as calculated from the following:   Height as of this encounter: 5\' 6"  (1.676 m).   Weight as of this encounter: 49.7 kg.  Code Status: Full code DVT Prophylaxis:  enoxaparin (LOVENOX) injection 40 mg Start: 11/07/22 1000   Level of Care: Level of care: Med-Surg Family Communication: updated patient's mother on phone on 7/19 Disposition Plan:      Remains inpatient appropriate:  DTP, lack of safe disposition   Antimicrobials: None   Medications  vitamin B-12  500 mcg Oral Daily   enoxaparin (LOVENOX) injection  40 mg Subcutaneous Daily   feeding supplement  237 mL Oral Q24H   haloperidol  2 mg Oral BID   hydrocerin   Topical BID   multivitamin with minerals  1 tablet Oral Daily   polyethylene glycol  17 g Oral Daily   senna-docusate  2 tablet Oral QHS   Tdap  0.5 mL Intramuscular Once      Subjective:   Natalie Brown was seen and examined today.  No issues, alert, nonverbal and noninteractive.  Eating breakfast, feeding herself without any assistance.  Vitals:   11/09/22 0648 11/09/22 0747 11/09/22 2034 11/10/22 0551  BP: 103/86 108/64 (!) 143/124 (!) 147/115  Pulse: 89 98 79 88  Resp: 16 17 16 19   Temp:   97.7 F (36.5 C) (!) 97.5 F (36.4 C)  TempSrc:   Oral Axillary  SpO2: 100% 100% 100% 96%  Weight:      Height:       No intake or output data in the 24 hours ending 11/11/22 1136    Wt Readings from Last 3 Encounters:  11/09/22 49.7 kg  12/24/21 72 kg  09/05/21 72.6 kg    Physical Exam General: Alert, awake Cardiovascular: S1  S2 clear, RRR.  Respiratory: CTAB, no wheezing Gastrointestinal: Soft, nontender, nondistended, NBS Ext: no pedal edema bilaterally Neuro: does not follow commands Psych: nonverbal and not interactive   Data Reviewed:  I have personally reviewed following labs    CBC Lab Results  Component Value Date   WBC 4.5 10/11/2022   RBC 4.29 10/11/2022   HGB 12.3 10/11/2022   HCT 36.7 10/11/2022   MCV 85.5 10/11/2022   MCH 28.7 10/11/2022   PLT 244 10/11/2022   MCHC 33.5 10/11/2022   RDW 14.1 10/11/2022   LYMPHSABS 1.4 09/04/2022   MONOABS 0.3 09/04/2022   EOSABS 0.1 09/04/2022   BASOSABS 0.0 09/04/2022     Last metabolic panel Lab Results  Component Value Date   NA 140 11/06/2022   K 4.6 11/06/2022   CL 106 11/06/2022   CO2 25 11/06/2022   BUN 15 11/06/2022   CREATININE 0.98 11/06/2022   GLUCOSE 73 11/06/2022   GFRNONAA >60 11/06/2022   GFRAA 65 09/19/2017   CALCIUM 8.7 (L) 11/06/2022   PHOS 3.5 10/28/2022   PROT 6.6 10/28/2022   ALBUMIN 3.5 10/28/2022   LABGLOB 2.8 06/21/2020   AGRATIO 1.5 06/21/2020   BILITOT 0.5 10/28/2022   ALKPHOS 57 10/28/2022   AST 28 10/28/2022   ALT 24 10/28/2022   ANIONGAP 9 11/06/2022    CBG (last 3)  No results for input(s): "GLUCAP" in the last 72 hours.     Coagulation Profile: No results for input(s): "INR", "PROTIME" in the last 168 hours.   Radiology Studies: I have personally reviewed the imaging studies  No results found.     Thad Ranger M.D. [facility] 11/11/2022, 11:36 AM  Available via Epic secure chat 7am-7pm After 7 pm, please refer to night coverage provider listed on amion.

## 2022-11-11 NOTE — TOC Progression Note (Signed)
Transition of Care [facility]) - Progression Note    Patient Details  Name: Natalie Brown MRN: [id number] Date of Birth: October 16, 1965  Transition of Care [facility]) CM/SW Contact  Inis Sizer, LCSW Phone Number: 11/11/2022, 9:20 AM  Clinical Narrative:    CSW attempted to reach Tiffany at [facility] without success - a voicemail was left requesting a return call.     Barriers to Discharge: Financial Resources, Inadequate or no insurance, Homeless with medical needs  Expected Discharge Plan and Services In-house Referral: Clinical Social Work   Post Acute Care Choice: Nursing Home Living arrangements for the past 2 months: Homeless [facility]) Expected Discharge Date: 12/24/21                                     Social Determinants of Health (SDOH) Interventions SDOH Screenings   Food Insecurity: No Food Insecurity (03/05/2022)  Housing: High Risk (03/21/2022)  Alcohol Screen: Low Risk  (09/06/2021)  Depression (PHQ2-9): Medium Risk (05/26/2020)  Tobacco Use: High Risk (03/21/2022)    Readmission Risk Interventions     No data to display

## 2022-11-12 NOTE — Plan of Care (Signed)

## 2022-11-12 NOTE — Progress Notes (Signed)
[facility]                                                                              Natalie Brown, is a 57 y.o. female, DOB - March 21, 1966, [name]:[id number] Admit date - 12/24/2021    Outpatient Primary MD for the patient is [name], [name], NP  LOS - 321  days  No chief complaint on file.      Brief summary   57 year old with a history of bipolar disorder and chronic dementia with advanced cerebral atrophy who was admitted to the acute units 12/24/2021 following a preceding admission at [facility] 09/06/2021-12/24/2021. She was initially admitted to [facility] with acute mania and aggressive behaviors. On 12/24/2021 she became nearly catatonic and was transferred to the ER and ultimately admitted. During her acute hospital stay she has been evaluated by Neurology and Psychiatry. EEG has revealed no evidence of seizure activity. Repeat brain imaging was unremarkable. It was ultimately determined that her AMS was due to due progression of her severe underlying dementia.  The patient has been deemed medically stable and is awaiting a disposition. The goal is to place her within the memory care unit but this is proving difficult as she is homeless and has no insurance. Per TOC patient's mother plans to go to DSS to apply for special assistance Medicaid.  No new events today.   Assessment & Plan    Principal Problem: Acute Metabolic Encephalopathy on chronic progressive early onset Dementia: -Nonverbal/noninteractive.  -Seen by neurology and psychiatry.   -Extensive workup has been negative.  At this point there is no expectation of improvement.   -continue scheduled Haldol, continue Zyprexa as needed -Patient had a fall on 7/13 and then on 7/16 -Alert, awake however nonverbal and non interactive.  Eating breakfast, feeding herself without assistance.   Bipolar Disorder -Continue psych meds - no agitation.    Vitamin B12 Deficiency:   -Continue B12 supplementation    Essential hypertension  Well controlled  Not on any scheduled medications at this time.     CKD Stage IIIa -Creatinine stable around 9.8. no new labs.    Hypokalemia  Replaced.    Tobacco Abuse Resolved.    Sinus Tachycardia Noted occasionally.  Patient became bradycardic after she was started on metoprolol which was subsequently stopped.  Echocardiogram was unremarkable.  TSH was normal.   Constipation/altered GI function -Continue bowel regimen   Underweight, protein calorie malnutrition Estimated body mass index is 17.58 kg/m as calculated from the following:   Height as of this encounter: 5\' 6"  (1.676 m).   Weight as of this encounter: 49.4 kg.  Code Status: Full code DVT Prophylaxis:  enoxaparin (LOVENOX) injection 40 mg Start: 11/07/22 1000   Level of Care: Level of care: Med-Surg Family Communication: updated patient's mother on phone on 7/19 Disposition Plan:      Remains inpatient appropriate:  DTP, lack of safe disposition   Antimicrobials: None   Medications  vitamin B-12  500 mcg Oral Daily   enoxaparin (LOVENOX) injection  40 mg Subcutaneous Daily   feeding supplement  237 mL Oral Q24H   haloperidol  2 mg  Oral BID   hydrocerin   Topical BID   multivitamin with minerals  1 tablet Oral Daily   polyethylene glycol  17 g Oral Daily   senna-docusate  2 tablet Oral QHS   Tdap  0.5 mL Intramuscular Once      Subjective:   Natalie Brown was seen and examined today.  Alert, non verbal. Eating breakfast.   Vitals:   11/10/22 0551 11/11/22 1446 11/12/22 0625 11/12/22 1300  BP: (!) 147/115 (!) 155/93 (!) 140/89 115/75  Pulse: 88 85 78 92  Resp: 19 20 18 18   Temp: (!) 97.5 F (36.4 C) 97.7 F (36.5 C) 97.9 F (36.6 C) 98.8 F (37.1 C)  TempSrc: Axillary Axillary Axillary Oral  SpO2: 96% 95% 96% 100%  Weight:   49.4 kg   Height:        Intake/Output Summary (Last 24 hours) at 11/12/2022 1641 Last data filed at 11/12/2022 1300 Gross per 24 hour   Intake 0 ml  Output --  Net 0 ml      Wt Readings from Last 3 Encounters:  11/12/22 49.4 kg  12/24/21 72 kg  09/05/21 72.6 kg    Physical Exam General: alert and comfortable.  Cardiovascular: S1s2 RRR.  Respiratory: AIR entry fair, on RA.  Gastrointestinal: Soft, NT ND BS+ Ext: NO edema.  Neuro: alert, non verbal.  Psych: remains non interactive.    Data Reviewed:  I have personally reviewed following labs    CBC Lab Results  Component Value Date   WBC 4.5 10/11/2022   RBC 4.29 10/11/2022   HGB 12.3 10/11/2022   HCT 36.7 10/11/2022   MCV 85.5 10/11/2022   MCH 28.7 10/11/2022   PLT 244 10/11/2022   MCHC 33.5 10/11/2022   RDW 14.1 10/11/2022   LYMPHSABS 1.4 09/04/2022   MONOABS 0.3 09/04/2022   EOSABS 0.1 09/04/2022   BASOSABS 0.0 09/04/2022     Last metabolic panel Lab Results  Component Value Date   NA 140 11/06/2022   K 4.6 11/06/2022   CL 106 11/06/2022   CO2 25 11/06/2022   BUN 15 11/06/2022   CREATININE 0.98 11/06/2022   GLUCOSE 73 11/06/2022   GFRNONAA >60 11/06/2022   GFRAA 65 09/19/2017   CALCIUM 8.7 (L) 11/06/2022   PHOS 3.5 10/28/2022   PROT 6.6 10/28/2022   ALBUMIN 3.5 10/28/2022   LABGLOB 2.8 06/21/2020   AGRATIO 1.5 06/21/2020   BILITOT 0.5 10/28/2022   ALKPHOS 57 10/28/2022   AST 28 10/28/2022   ALT 24 10/28/2022   ANIONGAP 9 11/06/2022    CBG (last 3)  No results for input(s): "GLUCAP" in the last 72 hours.     Coagulation Profile: No results for input(s): "INR", "PROTIME" in the last 168 hours.   Radiology Studies: I have personally reviewed the imaging studies  No results found.     Kathlen Mody M.D. [facility] 11/12/2022, 4:41 PM  Available via Epic secure chat 7am-7pm After 7 pm, please refer to night coverage provider listed on amion.

## 2022-11-13 NOTE — Progress Notes (Signed)
  Progress Note   Patient: Natalie Brown [id number]:[id number] DOB: 1966-02-21 DOA: 12/24/2021     322 DOS: the patient was seen and examined on 11/13/2022 at 8:37AM      Brief hospital course: Mrs. Natalie Brown is a 57 y.o. F with bipolar disorder who was transferred from behavioral health due to catatonia, and is now medically stabilized.          ASSESSMENT AND PLAN:  Acute Metabolic Encephalopathy on chronic progressive early onset Dementia: Bipolar Disorder - Continue Haldol, Zyprexa   Vitamin B12 Deficiency:   - Continue cyanocobalamin   Essential hypertension  BP normalized off medication   CKD Stage IIIa Cr stable   Underweight, protein calorie malnutrition moderate - Continue nutrition supplements, MVI - Consult dietitian     SUBJECTIVE: No nursing concerns, patient nonverbal.  Afebrile.   OBJECTIVE: BP 109/69 (BP Location: Left Arm)   Pulse (!) 105   Temp (!) 97.4 F (36.3 C) (Oral)   Resp 18   Ht 5\' 6"  (1.676 m)   Wt 48.5 kg   SpO2 99%   BMI 17.26 kg/m  General: Pt is alert, awake, not in acute distress Cardiovascular: RRR, nl S1-S2, no murmurs appreciated.   No LE edema.   Respiratory: Normal respiratory rate and rhythm.  CTAB without rales or wheezes. Abdominal: Abdomen soft and non-tender.  No distension or HSM.   Neuro/Psych: Strength symmetric in upper and lower extremities.  Judgment and insight appear impaired but unchanged.         Disposition: Status is: Inpatient Medically stable for discharge but without safe disposition at this time.  TOC working towards dispo.        Author: Alberteen Sam, MD 11/13/2022 10:55 AM  For on call review [email or web address].

## 2022-11-14 NOTE — Plan of Care (Signed)

## 2022-11-14 NOTE — Progress Notes (Signed)
  Progress Note   Patient: Natalie Brown [id number] DOB: [birth year] DOA: 12/24/2021     323 DOS: the patient was seen and examined on 11/14/2022 at 7:45AM      Brief hospital course: Mrs. Wilhelmi is a 57 y.o. F with bipolar disorder who was transferred from behavioral health due to catatonia, and is now medically stabilized.          ASSESSMENT AND PLAN:  Acute Metabolic Encephalopathy on chronic progressive early onset Dementia: Bipolar Disorder - Continue Haldol, Zyprexa   Vitamin B12 Deficiency:   - Continue cyanocobalamin   Essential hypertension  BP normalized off medication   CKD Stage IIIa Cr stable   Underweight, protein calorie malnutrition moderate Weight is down from 72 kg on admission to 48kg now, a steady and slow trend down despite nutritional supplements, dietitian consult.  My suspicion is that she will continue to deteriorate and that this will become a life limiting condition. - Continue nutrition supplements, MVI - Consult dietitian - Consult Palliative Care    SUBJECTIVE: No nursing concerns, patient nonverbal.  Afebrile.   OBJECTIVE: BP (!) 88/54 (BP Location: Right Arm)   Pulse 71   Temp 97.7 F (36.5 C) (Oral)   Resp 18   Ht 5\' 6"  (1.676 m)   Wt 48.5 kg   SpO2 100%   BMI 17.26 kg/m   General: Patient is awake, but behaves very somnolently, mostly catatonic Cardiovascular: RRR, nl S1-S2, no murmurs appreciated.   No LE edema.   Respiratory: Normal respiratory rate and rhythm.  CTAB without rales or wheezes. Abdominal: Abdomen soft and no grimace to palpation.  No distension or HSM.   Neuro/Psych: Nonverbal, does not follow commands.  Pupils reactive.  When observed, her physcial movements seem weak but coordinated, she mostly remains catatonic.           Disposition: Status is: Inpatient         Author: Alberteen Sam, MD 11/14/2022 10:08 AM  For on call review [email or web address].

## 2022-11-14 NOTE — TOC Progression Note (Signed)
Transition of Care [facility]) - Progression Note    Patient Details  Name: Natalie Brown MRN: [id number] Date of Birth: [birth year]  Transition of Care [facility]) CM/SW Contact  Inis Sizer, LCSW Phone Number: 11/14/2022, 1:26 PM  Clinical Narrative:    CSW spoke with MD regarding patient. MD to reach out to patient's mother to discuss the possibility of a Palliative consult to discuss GOC.     Barriers to Discharge: Financial Resources, Inadequate or no insurance, Homeless with medical needs  Expected Discharge Plan and Services In-house Referral: Clinical Social Work   Post Acute Care Choice: Nursing Home Living arrangements for the past 2 months: Homeless [facility]) Expected Discharge Date: 12/24/21                                     Social Determinants of Health (SDOH) Interventions SDOH Screenings   Food Insecurity: No Food Insecurity (03/05/2022)  Housing: High Risk (03/21/2022)  Alcohol Screen: Low Risk  (09/06/2021)  Depression (PHQ2-9): Medium Risk (05/26/2020)  Tobacco Use: High Risk (03/21/2022)    Readmission Risk Interventions     No data to display

## 2022-11-14 NOTE — Progress Notes (Signed)
Spoke with mother by phone.  Introduced myself and role.  Discussed her overall diagnoses (degenerative neurologic disease, dementia, bipolar disorder and accompanying weight loss).  Ensured our shared commitment to treating Donyetta with dignity and respect through to the end of what appears to all of her treating physicians as a permanent and progressive neurological disease.    Will plan to meet with mother and hopefully Palliative Care team on Monday at 1pm

## 2022-11-14 NOTE — Progress Notes (Signed)
Arrived to my shift with sitter order removed for patient per nursing administration. Patient has had a documented fall on 5/30, 6/21, 7/13, 7/16. With the last fall on 7/16 resulting in an eyebrow lac that was unable to be sutured due to patient inability to follow direction. The eyebrow lac was treated with steri-strips.  Rounded on the patient first thing this morning to ensure safe environment and check that the bed alarm is on.  Patient rounded on during the day by nursing staff, bed alarm maintained.

## 2022-11-15 NOTE — Plan of Care (Signed)
  Problem: Education: Goal: Knowledge of General Education information will improve Description: Including pain rating scale, medication(s)/side effects and non-pharmacologic comfort measures Outcome: Progressing   Problem: Clinical Measurements: Goal: Ability to maintain clinical measurements within normal limits will improve Outcome: Progressing Goal: Will remain free from infection Outcome: Progressing Goal: Diagnostic test results will improve Outcome: Progressing Goal: Respiratory complications will improve Outcome: Progressing Goal: Cardiovascular complication will be avoided Outcome: Progressing   Problem: Health Behavior/Discharge Planning: Goal: Ability to manage health-related needs will improve Outcome: Progressing   Problem: Nutrition: Goal: Adequate nutrition will be maintained Outcome: Progressing   Problem: Activity: Goal: Risk for activity intolerance will decrease Outcome: Progressing   Problem: Coping: Goal: Level of anxiety will decrease Outcome: Progressing   Problem: Elimination: Goal: Will not experience complications related to bowel motility Outcome: Progressing Goal: Will not experience complications related to urinary retention Outcome: Progressing

## 2022-11-15 NOTE — Progress Notes (Signed)
Nutrition Follow-up  DOCUMENTATION CODES:   Severe malnutrition in context of chronic illness  INTERVENTION:  - Start Calorie Count. Collect final results on Monday 7/29.   - Continue Ensure Enlive po q day, each supplement provides 350 kcal and 20 grams of protein.  NUTRITION DIAGNOSIS:   Severe Malnutrition related to chronic illness (advanced dementia) as evidenced by energy intake < 75% for > or equal to 1 month, percent weight loss.  GOAL:   Patient will meet greater than or equal to 90% of their needs  MONITOR:   PO intake, Supplement acceptance  REASON FOR ASSESSMENT:   LOS   ASSESSMENT:   57 y.o. female admits related to AMS. PMH includes: Bipolar disorde rand HTN. Pt is currently receiving medical management related to early onset dementia with behavioral disturbances.  Meds reviewed:  Vit B12, MVI, miralax, senokot. Labs reviewed: WDL.   MD consult for calorie count. RD went to see pt and spoke with sitter. Pt ate 100% of her breakfat tray this am. RD collected ticket and placed in calorie count folder. RD discussed calorie count with both sitter and RN. Pt's intakes have fluctuated from 0-100% since admission. Sometimes the pt will eat really well and clear her plate and other times she will not eat anything. RD took an average of meal intakes over the past 7 days and pt is eating about 50% of her meals on average. Pt has experienced a 15% wt loss in the past 3 months which is significant. Pt with advanced dementia. RD does not recommend feeding tubes in patients with advanced dementia. Discussed pt with MD, who plans to have GOC discussion with family. Palliative care consulted.   NUTRITION - FOCUSED PHYSICAL EXAM:  Flowsheet Row Most Recent Value  Orbital Region Moderate depletion  Upper Arm Region Moderate depletion  Thoracic and Lumbar Region Unable to assess  Buccal Region Mild depletion  Temple Region Moderate depletion  Clavicle Bone Region Moderate  depletion  Clavicle and Acromion Bone Region Moderate depletion  Scapular Bone Region Moderate depletion  Dorsal Hand Mild depletion  Patellar Region Mild depletion  Anterior Thigh Region Mild depletion  Posterior Calf Region Mild depletion  Edema (RD Assessment) None  Hair Reviewed  Eyes Unable to assess  Mouth Unable to assess  Skin Reviewed  Nails Reviewed       Diet Order:   Diet Order             DIET DYS 3 Room service appropriate? Yes; Fluid consistency: Thin  Diet effective now                   EDUCATION NEEDS:   Not appropriate for education at this time  Skin:  Skin Assessment: Reviewed RN Assessment  Last BM:  7/24 - type 5  Height:   Ht Readings from Last 1 Encounters:  02/03/22 5\' 6"  (1.676 m)    Weight:   Wt Readings from Last 1 Encounters:  11/13/22 48.5 kg    Ideal Body Weight:     BMI:  Body mass index is 17.26 kg/m.  Estimated Nutritional Needs:   Kcal:  1800-2160 kcals  Protein:  90-105 gm  Fluid:  >/= 1.8 L  Bethann Humble, RD, LDN, CNSC.

## 2022-11-15 NOTE — Progress Notes (Signed)
1:16 AM Patient setting bed alarm off . RN and NT in the room for past 15 minutes constantly trying to get her back in the bed. With previous history of multiple falls  patient needs a physical sitter to prevent another fall from happening. Paged provider for the same.Awaiting orders

## 2022-11-15 NOTE — Progress Notes (Signed)
  Progress Note   Patient: Natalie Brown [id number] DOB: [birth year] DOA: 12/24/2021     324 DOS: the patient was seen and examined on 11/15/2022        Brief hospital course: Natalie Brown is a 57 y.o. F with bipolar disorder who was transferred from behavioral health due to catatonia, and is now medically stabilized.          ASSESSMENT AND PLAN:  Acute Metabolic Encephalopathy on chronic progressive early onset Dementia: Bipolar Disorder - Continue Haldol, Zyprexa   Underweight, protein calorie malnutrition moderate Patient has now lost over 20 kg since being admitted to the hospital.  In the last 2 months has become weaker, less able to eat due to generalized weakness, less responsive.  [name] fallen several times, and continues to lose weight despite dietitian and nutritional supplements. - Consult palliative care - Continue to attempt nutritional supplements and MVI - plan to meet family on Monday for GOC discussion  Vitamin B12 Deficiency:   - Continue cyanocobalamin   Essential hypertension  BP normalized off medication   CKD Stage IIIa Cr stable        SUBJECTIVE: No nursing concerns, patient nonverbal.  Afebrile.   OBJECTIVE: BP (!) 88/54 (BP Location: Right Arm)   Pulse 71   Temp 97.7 F (36.5 C) (Oral)   Resp 18   Ht 5\' 6"  (1.676 m)   Wt 48.5 kg   SpO2 100%   BMI 17.26 kg/m   General: Patient remains somnolent, mostly catatonic Cardiovascular: RRR, nl S1-S2, no murmurs appreciated.   No LE edema.   Respiratory: Normal respiratory rate and rhythm.  CTAB without rales or wheezes. Abdominal: Abdomen soft and no grimace to palpation.  No distension or HSM.   Neuro/Psych: Nonverbal, does not follow commands.  When observed, her physcial movements seem weak but coordinated, she mostly remains catatonic.       Family Communication: Mother last night    Disposition: Status is: Inpatient TOC unable to provide safe disposition for  discharge.        Author: Alberteen Sam, MD 11/15/2022 8:19 AM  For on call review [email or web address].

## 2022-11-16 DIAGNOSIS — E43 Unspecified severe protein-calorie malnutrition: Secondary | ICD-10-CM | POA: Diagnosis present

## 2022-11-16 LAB — CBC
HCT: 37.6 % (ref 36.0–46.0)
Hemoglobin: 12.3 g/dL (ref 12.0–15.0)
MCH: 29.2 pg (ref 26.0–34.0)
MCHC: 32.7 g/dL (ref 30.0–36.0)
MCV: 89.3 fL (ref 80.0–100.0)
Platelets: 277 10*3/uL (ref 150–400)
RBC: 4.21 MIL/uL (ref 3.87–5.11)
RDW: 14.9 % (ref 11.5–15.5)
WBC: 3.4 10*3/uL — ABNORMAL LOW (ref 4.0–10.5)
nRBC: 0 % (ref 0.0–0.2)

## 2022-11-16 LAB — COMPREHENSIVE METABOLIC PANEL
ALT: 15 U/L (ref 0–44)
AST: 16 U/L (ref 15–41)
Albumin: 3.4 g/dL — ABNORMAL LOW (ref 3.5–5.0)
Alkaline Phosphatase: 48 U/L (ref 38–126)
Anion gap: 14 (ref 5–15)
BUN: 15 mg/dL (ref 6–20)
CO2: 24 mmol/L (ref 22–32)
Calcium: 9 mg/dL (ref 8.9–10.3)
Chloride: 107 mmol/L (ref 98–111)
Creatinine, Ser: 1 mg/dL (ref 0.44–1.00)
GFR, Estimated: >60 mL/min (ref >60)
Glucose, Bld: 87 mg/dL (ref 70–99)
Potassium: 3.7 mmol/L (ref 3.5–5.1)
Sodium: 145 mmol/L (ref 135–145)
Total Bilirubin: 1.1 mg/dL (ref 0.3–1.2)
Total Protein: 6.3 g/dL — ABNORMAL LOW (ref 6.5–8.1)

## 2022-11-16 NOTE — Progress Notes (Signed)
Pt was very somnolent throughout shift. RN assess patient throughout the shift, attempted to administer medications multiple times but patient would not wake up enough to take medications. Bed in low and locked position, bed alarm on, door left open.

## 2022-11-16 NOTE — Progress Notes (Signed)
  Progress Note   Patient: Natalie Brown [id number] DOB: 07/14/1965 DOA: 12/24/2021     325 DOS: the patient was seen and examined on 11/16/2022 at 11:20 AM      Brief hospital course: Natalie Brown is a 57 y.o. F with bipolar disorder who was transferred from behavioral health due to catatonia, and is now medically stabilized.          ASSESSMENT AND PLAN:  Early onset dementia  Bipolar Disorder No change - Continue Haldol, Zyprexa   Vitamin B12 Deficiency:   - Continue cyanocobalamin   Essential hypertension  BP normalized off medication   CKD Stage IIIa Cr stable   Severe protein calorie malnutrition - Continue nutrition supplements, MVI - Consult dietitian - Consult Palliative Care    SUBJECTIVE: no new concerns from nursing.  They report she remains intermittently responsive, but does not follow commands consistently, cooperates with only very few staff, and is mostly in the catatonic state in which I've seen her.  Afebrile, patient nonverbal.   OBJECTIVE: BP 110/63 (BP Location: Right Arm)   Pulse 98   Temp 98.2 F (36.8 C)   Resp 17   Ht 5\' 6"  (1.676 m)   Wt 48.5 kg   SpO2 100%   BMI 17.26 kg/m   General: Patient is awake, but behaves very somnolently, mostly catatonic, stereotypic frozen movements Cardiovascular: RRR, nl S1-S2, no murmurs appreciated.   No LE edema.   Respiratory: Normal respiratory rate and rhythm.  CTAB without rales or wheezes. Abdominal: Abdomen soft and no grimace to palpation.  No distension or HSM.   Neuro/Psych: Nonverbal, does not follow commands.  Pupils reactive.  When observed, her physcial movements seem weak but coordinated, she mostly remains catatonic.       Data Reviewed: Comprehensive metabolic panel unremarkable, CBC unremarkable  Family Communication:     Disposition: Status is: Inpatient         Author: Alberteen Sam, MD 11/16/2022 3:52 PM  For on call review [email or web address].

## 2022-11-17 NOTE — Progress Notes (Signed)
  Progress Note   Patient: Natalie Brown [id number] DOB: 1966/01/09 DOA: 12/24/2021     326 DOS: the patient was seen and examined on 11/17/2022        Brief hospital course: Natalie Brown is a 57 y.o. F with bipolar disorder who was transferred from behavioral health due to catatonia, and is now medically stabilized.          ASSESSMENT AND PLAN:  Early onset dementia Catatonia Bipolar disorder Vitamin B12 deficiency Essential hypertension CKD stage IIIa Severe protein calorie malnutrition No change - Continue Haldol, Zyprexa, vitamin B12, nutrition supplements, MVI  - Consult Palliative Care, family meeting tomorrow    SUBJECTIVE: No clinical change.  She remains mostly catatonic.  Nursing described gradual weakening over weeks to months, as well as a pattern during the time of relatively inconsistent responsiveness to staff (she was responsive and cooperative with some staff, but only very few), mostly remains catatonic   OBJECTIVE: BP (!) 117/91 (BP Location: Right Arm)   Pulse 94   Temp (!) 97.3 F (36.3 C)   Resp 19   Ht 5\' 6"  (1.676 m)   Wt 46.3 kg   SpO2 92%   BMI 16.48 kg/m   General: Patient is awake and staring, appears frozen with her arms in the air, mouth wide open when I enter, makes eye contact to speech, slowly moves towards me, then freezes Cardiovascular: RRR, no murmurs, no peripheral edema Respiratory: Normal respiratory rate and rhythm, lungs clear without rales or wheezes, although exam limited by lack of following commands. Abdominal: Voluntary guarding, no grimace to palpation Neuro/Psych: Nonverbal, does not follow commands, pupils reactive, makes eye contact, extraocular movements appear intact although she is not following commands so this is limited, strength in all 4 extremities appears generally weak but symmetric.      Data Reviewed: Comprehensive metabolic panel shows normal electrolytes, renal function, liver function test CBC  normal  Family Communication: None present    Disposition: Status is: Inpatient         Author: Alberteen Sam, MD 11/17/2022 8:58 AM  For on call review [email or web address].

## 2022-11-18 DIAGNOSIS — Z515 Encounter for palliative care: Secondary | ICD-10-CM

## 2022-11-18 DIAGNOSIS — Z7189 Other specified counseling: Secondary | ICD-10-CM

## 2022-11-18 MED ORDER — HALOPERIDOL 1 MG PO TABS
1.0000 mg | ORAL_TABLET | Freq: Two times a day (BID) | ORAL | Status: DC
  Administered 2022-11-18 – 2022-11-28 (×20): 1 mg via ORAL
  Filled 2022-11-18 (×21): qty 1

## 2022-11-18 MED ORDER — PROSOURCE PLUS PO LIQD
30.0000 mL | Freq: Two times a day (BID) | ORAL | Status: DC
  Administered 2022-11-19 – 2023-01-22 (×92): 30 mL via ORAL
  Filled 2022-11-18 (×91): qty 30

## 2022-11-18 MED ORDER — ENSURE ENLIVE PO LIQD
237.0000 mL | Freq: Two times a day (BID) | ORAL | Status: DC
  Administered 2022-11-19 – 2023-01-22 (×76): 237 mL via ORAL

## 2022-11-18 NOTE — Consult Note (Signed)
Consultation Note Date: 11/18/2022   Patient Name: Natalie Brown  DOB: [birth year]  MRN: [id number]  Age / Sex: 57 y.o., female  PCP: [name], [name], NP Referring Physician: Alberteen Sam, *  Reason for Consultation: Establishing goals of care  HPI/Patient Profile: 57 y.o. female  with past medical history of *** admitted on 12/24/2021 with ***.  Natalie Brown is a 57 y.o. female admitted medically for 12/26/2021  1:58 AM for possible catatonia/altered mental status. She carries the psychiatric diagnoses of bipolar affective disorder with psychotic symptoms and has a past medical history of hypertension.     Her current presentation of altered mental status is most consistent with catatonia versus progressive dementia versus psychogenic. She meets criteria for inpatient psychiatry based on admission prior to hospitalization.  Current inpatient BH psychotropic medications include Haldol 1 mg every morning and 2 mg nightly, propranolol 10 mg twice daily and historically she has had a good response to these medications. She was compliant with medications prior to admission as evidenced by [facility]. On initial examination, patient is cooperative with a bright affect; she repeats concerns and asks the same questions over and over.  Clinical Assessment and Goals of Care: ***  {Primary Decision WUJWJ:19147}    SUMMARY OF RECOMMENDATIONS   *** Code Status/Advance Care Planning: {Palliative Code status:23503}   Symptom Management:  ***  Palliative Prophylaxis:  {Palliative Prophylaxis:21015}  Additional Recommendations (Limitations, Scope, Preferences): {Recommended Scope and Preferences:21019}  Psycho-social/Spiritual:  Desire for further Chaplaincy support:{YES NO:22349} Additional Recommendations: {PAL SOCIAL:21064}  Prognosis:  {Palliative Care Prognosis:23504}  Discharge Planning:  {Palliative dispostion:23505}      Primary Diagnoses: Present on Admission:  Tobacco abuse  Bipolar affective disorder, current episode manic with psychotic symptoms (HCC)  Delirium due to multiple etiologies, acute, hyperactive  Altered mental state  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)  B12 deficiency  Sinus tachycardia  COVID-19 virus infection  Essential hypertension   I have reviewed the medical record, interviewed the patient and family, and examined the patient. The following aspects are pertinent.  Past Medical History:  Diagnosis Date   Bipolar 1 disorder with moderate mania (HCC) 12/30/2013   Hypertension    Social History   Socioeconomic History   Marital status: Married    Spouse name: Not on file   Number of children: Not on file   Years of education: Not on file   Highest education level: Not on file  Occupational History   Not on file  Tobacco Use   Smoking status: Every Day   Smokeless tobacco: Current  Substance and Sexual Activity   Alcohol use: No   Drug use: No   Sexual activity: Not on file  Other Topics Concern   Not on file  Social History Narrative   Not on file   Social Determinants of Health   Financial Resource Strain: Not on file  Food Insecurity: No Food Insecurity (03/05/2022)   Hunger Vital Sign    Worried About Running Out of Food in  the Last Year: Never true    Ran Out of Food in the Last Year: Never true  Transportation Needs: Not on file  Physical Activity: Not on file  Stress: Not on file  Social Connections: Not on file   History reviewed. No pertinent family history. Scheduled Meds:  vitamin B-12  500 mcg Oral Daily   enoxaparin (LOVENOX) injection  40 mg Subcutaneous Daily   feeding supplement  237 mL Oral Q24H   haloperidol  2 mg Oral BID   hydrocerin   Topical BID   multivitamin with minerals  1 tablet Oral Daily   polyethylene glycol  17 g Oral Daily   senna-docusate  2 tablet Oral QHS    Tdap  0.5 mL Intramuscular Once   Continuous Infusions: PRN Meds:.acetaminophen, alum & mag hydroxide-simeth, bisacodyl, fluticasone, hydrALAZINE, ipratropium-albuterol, lactulose, OLANZapine **OR** OLANZapine, ondansetron, mouth rinse Medications Prior to Admission:  Prior to Admission medications   Medication Sig Start Date End Date Taking? Authorizing Provider  Incontinence Supply Disposable (BRIEFS OVERNIGHT MEDIUM) MISC 1 each by Does not apply route every 4 (four) hours as needed (soiling). 06/10/22  Yes Ghimire, Lyndel Safe, MD  CLARITIN 10 MG tablet Take 10 mg by mouth daily. 04/11/21   [provider]  fluticasone (FLONASE) 50 MCG/ACT nasal spray Place 1 spray into both nostrils daily.    [provider]  haloperidol (HALDOL) 1 MG tablet Take 1 tablet (1 mg total) by mouth 2 (two) times daily. 06/10/22 05/06/23  Dorcas Carrow, MD  hydrOXYzine (ATARAX) 25 MG tablet Take 1 tablet (25 mg total) by mouth 3 (three) times daily as needed for anxiety. 06/10/22   Dorcas Carrow, MD  LORazepam (ATIVAN) 0.5 MG tablet Take 1 tablet (0.5 mg total) by mouth every 6 (six) hours as needed for anxiety (agitation). 06/10/22   Dorcas Carrow, MD  OLANZapine zydis (ZYPREXA) 5 MG disintegrating tablet Take 1 tablet (5 mg total) by mouth every 8 (eight) hours as needed (agitation, if needed in addition to Lorazepam). 06/10/22 07/10/22  Dorcas Carrow, MD  pseudoephedrine-guaifenesin (MUCINEX D) 60-600 MG 12 hr tablet Take 1 tablet by mouth every 12 (twelve) hours. 09/03/17   Hoy Register, MD   Allergies  Allergen Reactions   Penicillins Itching   Review of Systems  Physical Exam  Vital Signs: BP 139/82 (BP Location: Right Arm)   Pulse 82   Temp (!) 97.5 F (36.4 C)   Resp 18   Ht 5\' 6"  (1.676 m)   Wt 46.3 kg   SpO2 100%   BMI 16.48 kg/m  Pain Scale: Faces POSS *See Group Information*: 1-Acceptable,Awake and alert Pain Score: Asleep   SpO2: SpO2: 100 % O2 Device:SpO2: 100 % O2  Flow Rate: .O2 Flow Rate (L/min): 0 L/min  IO: Intake/output summary: No intake or output data in the 24 hours ending 11/18/22 1255  LBM: Last BM Date : 11/14/22 Baseline Weight: Weight: 72 kg Most recent weight: Weight: 46.3 kg     Palliative Assessment/Data:     Time In: *** Time Out: *** Time Total: *** Greater than 50%  of this time was spent counseling and coordinating care related to the above assessment and plan.  Signed by: Lorinda Creed, NP   Please contact Palliative Medicine Team phone at [phone] for questions and concerns.  For individual provider: See Loretha Stapler

## 2022-11-18 NOTE — Progress Notes (Signed)
Calorie Count Note  48 hour calorie count ordered.  Diet: Dys 3, Thin liquids  Supplements: Ensure Enlive po q day, each supplement provides 350 kcal and 20 grams of protein.  RD went to collect 48 hr calorie count and only 2 tickets were in the folder. Calorie count will be inconclusive, as RD is unable to determine other meals consumed. Per record, pt has been eating an average of 42% of her meals over the past 7 days.   Breakfast (7/26): 582 kcals, 18 gm protein Breakfast (7/29): 122 kcals, 4 gm protein  Supplements: none documented  Total intake:  Inconclusive results.   Nutrition Dx:  Severe Malnutrition related to chronic illness (advanced dementia) as evidenced by energy intake < 75% for > or equal to 1 month, percent weight loss.   Goal:  Patient will meet greater than or equal to 90% of their needs   Intervention:  1) Pt is not meeting her needs based on average intakes per EMR and % wt loss; RD does not recommend feeding tubes in patients with advanced dementia.   2) If family is wanting aggressive measures, RD recommends PEG placement.   3) Continue Dys 3 diet.   4) Continue Ensure Enlive q day.   Bethann Humble, RD, LDN, CNSC.

## 2022-11-18 NOTE — Progress Notes (Signed)
  Progress Note   Patient: Natalie Brown [id number] DOB: Apr 09, 1966 DOA: 12/24/2021     327 DOS: the patient was seen and examined on 11/18/2022       Brief hospital course: Mrs. Sumbry is a 57 y.o. F with bipolar disorder who was transferred from behavioral health due to catatonia, and is now medically stabilized.          ASSESSMENT AND PLAN:  Early onset dementia  Bipolar Disorder No change.  No PRNs required in over 10 days at least. - Continue Haldol, reduce dose and monitor symptoms.   Severe protein calorie malnutrition - Continue nutrition supplements, MVI - Consult dietitian - Consult Palliative Care    SUBJECTIVE: No change   OBJECTIVE: BP 139/82 (BP Location: Right Arm)   Pulse 82   Temp (!) 97.5 F (36.4 C)   Resp 18   Ht 5\' 6"  (1.676 m)   Wt 46.3 kg   SpO2 100%   BMI 16.48 kg/m   General: Patient sleeping, does not rouse well for me today. Cardiovascular: No murmurs, no LE edema, rate normal Respiratory: Normal respiratory rate and rhythm.       Family Communication: Mother and sister at the bedside with Palliative Medicine    Disposition: Status is: Inpatient         Author: Alberteen Sam, MD 11/18/2022 3:40 PM  For on call review [email or web address].

## 2022-11-18 NOTE — TOC Progression Note (Addendum)
Transition of Care [facility]) - Progression Note    Patient Details  Name: Natalie Brown MRN: [id number] Date of Birth: 02-17-1966  Transition of Care [facility]) CM/SW Contact  Inis Sizer, LCSW Phone Number: 11/18/2022, 9:01 AM  Clinical Narrative:    1pm-2:10pm: CSW participated in a family meeting with patient's mother [name], sister Carmon Sails, NP of palliative and Dr. Maryfrances Bunnell. Several topics were discussed and all were in agreement to reconvene as a group once family has time to process information.  11:30am: CSW spoke with Velna Hatchet to discuss patient. Velna Hatchet will have staff review referral and consider patient for a DTP bed at the facility. Velna Hatchet will return call to CSW.  10am: CSW spoke with Whitney of central intake for [facility] and [facility] who states that patient's managed Medicaid requires an authorization before admission into a facility Whitney states the facility cannot accept an LOG as patient has managed Medicaid.  9am: CSW spoke with Dr. Maryfrances Bunnell who confirms there is a scheduled meeting for today at 1pm with palliative for GOC.  CSW requested Velna Hatchet with [facility] review patient for a possible LTC bed.  CSW requested [place] with [facility] facilities review patient for possible LTC bed.  CSW sent patient's clinicals out to several facilities for additional review in attempt to obtain bed offers.  CSW spoke with patient's mom Joette Catching confirms she will be present today at 1pm for GOC meeting. CSW explained that a staff member from palliative will be at the meeting as well. CSW briefly explained what palliative is and why they were consulted.    Barriers to Discharge: Financial Resources, Inadequate or no insurance, Homeless with medical needs  Expected Discharge Plan and Services In-house Referral: Clinical Social Work   Post Acute Care Choice: Nursing Home Living arrangements for the past 2 months: Homeless [facility]) Expected Discharge Date:  12/24/21                                     Social Determinants of Health (SDOH) Interventions SDOH Screenings   Food Insecurity: No Food Insecurity (03/05/2022)  Housing: High Risk (03/21/2022)  Alcohol Screen: Low Risk  (09/06/2021)  Depression (PHQ2-9): Medium Risk (05/26/2020)  Tobacco Use: High Risk (03/21/2022)    Readmission Risk Interventions     No data to display

## 2022-11-18 NOTE — NC FL2 (Signed)
[place] MEDICAID FL2 LEVEL OF CARE FORM     IDENTIFICATION  Patient Name: Natalie Brown Birthdate: 23-Feb-1966 Sex: female Admission Date (Current Location): 12/24/2021  [place] and [place] Number:  Haynes Bast [id number] [facility] Facility and Address:  [facility] [place], [address] [place], [place] [address]      Provider Number: [id number]  Attending Physician Name and Address:  Alberteen Sam, *  Relative Name and Phone Number:       Current Level of Care: Hospital Recommended Level of Care: Skilled Nursing Facility Prior Approval Number:    Date Approved/Denied:   PASRR Number:    Discharge Plan: SNF    Current Diagnoses: Patient Active Problem List   Diagnosis Date Noted   Protein-calorie malnutrition, severe 11/16/2022   B12 deficiency 02/19/2022   COVID-19 virus infection 02/19/2022   Drooling 02/19/2022   Essential hypertension 02/19/2022   Altered mental state 12/26/2021   Sinus tachycardia 12/25/2021   Tobacco abuse 12/24/2021   Delirium due to multiple etiologies, acute, hyperactive 12/24/2021   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) 11/11/2021   Bipolar affective disorder, current episode manic with psychotic symptoms (HCC) 09/12/2021   Dental caries 06/29/2015   Cervical cancer screening 02/22/2014   Current smoker 02/22/2014   Urge incontinence of urine 12/30/2013   Need for prophylactic vaccination and inoculation against influenza 12/30/2013   Bipolar 1 disorder (HCC) 12/30/2013    Orientation RESPIRATION BLADDER Height & Weight     Self  Normal Incontinent Weight: 102 lb 1.2 oz (46.3 kg) Height:  5\' 6"  (167.6 cm)  BEHAVIORAL SYMPTOMS/MOOD NEUROLOGICAL BOWEL NUTRITION STATUS  Other (Comment), Wanderer (Dementia, no behaviors or aggression)   Incontinent Diet (See discharge summary)  AMBULATORY STATUS COMMUNICATION OF NEEDS Skin   Supervision Verbally Normal                       Personal Care  Assistance Level of Assistance  Bathing, Dressing, Feeding Bathing Assistance: Maximum assistance Feeding assistance: Maximum assistance Dressing Assistance: Maximum assistance     Functional Limitations Info  Hearing, Speech, Sight Sight Info: Adequate Hearing Info: Adequate Speech Info: Impaired    SPECIAL CARE FACTORS FREQUENCY  PT (By licensed PT), OT (By licensed OT)     PT Frequency: 3x weekly OT Frequency: 3x weekly            Contractures Contractures Info: Not present    Additional Factors Info  Code Status, Allergies Code Status Info: Full Code Allergies Info: Penicillins Psychotropic Info: Ativan, Haldol         Current Medications (11/18/2022):  This is the current hospital active medication list Current Facility-Administered Medications  Medication Dose Route Frequency Provider Last Rate Last Admin   acetaminophen (TYLENOL) tablet 650 mg  650 mg Oral Q6H PRN Orland Mustard, MD   650 mg at 08/15/22 2102   alum & mag hydroxide-simeth (MAALOX/MYLANTA) 200-200-20 MG/5ML suspension 30 mL  30 mL Oral Q4H PRN Orland Mustard, MD       bisacodyl (DULCOLAX) suppository 10 mg  10 mg Rectal Daily PRN Kc, Dayna Barker, MD       cyanocobalamin (VITAMIN B12) tablet 500 mcg  500 mcg Oral Daily Hazeline Junker B, MD   500 mcg at 11/17/22 1000   enoxaparin (LOVENOX) injection 40 mg  40 mg Subcutaneous Daily Vann, Jessica U, DO   40 mg at 11/15/22 1133   feeding supplement (ENSURE ENLIVE / ENSURE PLUS) liquid 237 mL  237 mL Oral Q24H Kathlen Mody, MD   237 mL at 11/12/22 1831   fluticasone (FLONASE) 50 MCG/ACT nasal spray 1 spray  1 spray Each Nare Daily PRN Orland Mustard, MD       haloperidol (HALDOL) tablet 2 mg  2 mg Oral BID Vann, Jessica U, DO   2 mg at 11/18/22 0150   hydrALAZINE (APRESOLINE) injection 10 mg  10 mg Intravenous Q4H PRN Amin, Loura Halt, MD       hydrocerin (EUCERIN) cream   Topical BID Orland Mustard, MD   Given at 11/12/22 2135   ipratropium-albuterol (DUONEB)  0.5-2.5 (3) MG/3ML nebulizer solution 3 mL  3 mL Nebulization Q4H PRN Amin, Ankit Chirag, MD       lactulose (CHRONULAC) 10 GM/15ML solution 20 g  20 g Oral BID PRN Almon Hercules, MD       multivitamin with minerals tablet 1 tablet  1 tablet Oral Daily Amin, Ankit Chirag, MD   1 tablet at 11/17/22 1000   OLANZapine (ZYPREXA) tablet 5 mg  5 mg Oral BID PRN Cinderella, Margaret A   5 mg at 11/03/22 2209   Or   OLANZapine (ZYPREXA) injection 5 mg  5 mg Intramuscular BID PRN Cinderella, Margaret A   5 mg at 11/05/22 1552   ondansetron (ZOFRAN) tablet 4 mg  4 mg Oral Q8H PRN Orland Mustard, MD   4 mg at 03/17/22 0139   Oral care mouth rinse  15 mL Mouth Rinse PRN Leroy Sea, MD       polyethylene glycol (MIRALAX / GLYCOLAX) packet 17 g  17 g Oral Daily Pokhrel, Laxman, MD   17 g at 11/15/22 1133   senna-docusate (Senokot-S) tablet 2 tablet  2 tablet Oral QHS Leatha Gilding, MD   2 tablet at 11/13/22 2140   Tdap (BOOSTRIX) injection 0.5 mL  0.5 mL Intramuscular Once Joseph Art, DO         Discharge Medications: Please see discharge summary for a list of discharge medications.  Relevant Imaging Results:  Relevant Lab Results:   Additional Information SSN: [id number]  Inis Sizer, [place]

## 2022-11-18 NOTE — Progress Notes (Signed)
Name: Natalie Brown. Ripoll DOB: September 23, 1965  Please be advised that the above-named patient has a primary diagnosis of dementia which supersedes any psychiatric diagnosis.  Edwin Dada, MSW, LCSW Transitions of Care  Clinical Social Worker II [phone]

## 2022-11-18 NOTE — Plan of Care (Signed)

## 2022-11-19 NOTE — Plan of Care (Signed)
  Problem: Education: Goal: Knowledge of General Education information will improve Description: Including pain rating scale, medication(s)/side effects and non-pharmacologic comfort measures 11/19/2022 0803 by Earlie Raveling, RN Outcome: Not Progressing 11/19/2022 0803 by Earlie Raveling, RN Outcome: Progressing   Problem: Health Behavior/Discharge Planning: Goal: Ability to manage health-related needs will improve 11/19/2022 0803 by Earlie Raveling, RN Outcome: Not Progressing 11/19/2022 0803 by Earlie Raveling, RN Outcome: Progressing   Problem: Clinical Measurements: Goal: Ability to maintain clinical measurements within normal limits will improve 11/19/2022 0803 by Earlie Raveling, RN Outcome: Not Progressing 11/19/2022 0803 by Earlie Raveling, RN Outcome: Progressing Goal: Will remain free from infection 11/19/2022 0803 by Earlie Raveling, RN Outcome: Not Progressing 11/19/2022 0803 by Earlie Raveling, RN Outcome: Progressing Goal: Diagnostic test results will improve 11/19/2022 0803 by Earlie Raveling, RN Outcome: Not Progressing 11/19/2022 0803 by Earlie Raveling, RN Outcome: Progressing Goal: Respiratory complications will improve 11/19/2022 0803 by Earlie Raveling, RN Outcome: Not Progressing 11/19/2022 0803 by Earlie Raveling, RN Outcome: Progressing Goal: Cardiovascular complication will be avoided 11/19/2022 0803 by Earlie Raveling, RN Outcome: Not Progressing 11/19/2022 0803 by Earlie Raveling, RN Outcome: Progressing   Problem: Activity: Goal: Risk for activity intolerance will decrease 11/19/2022 0803 by Earlie Raveling, RN Outcome: Not Progressing 11/19/2022 0803 by Earlie Raveling, RN Outcome: Progressing   Problem: Nutrition: Goal: Adequate nutrition will be maintained 11/19/2022 0803 by Earlie Raveling, RN Outcome: Not Progressing 11/19/2022 0803 by Earlie Raveling, RN Outcome: Progressing   Problem: Coping: Goal: Level of anxiety will decrease 11/19/2022  0803 by Earlie Raveling, RN Outcome: Not Progressing 11/19/2022 0803 by Earlie Raveling, RN Outcome: Progressing   Problem: Elimination: Goal: Will not experience complications related to bowel motility 11/19/2022 0803 by Earlie Raveling, RN Outcome: Not Progressing 11/19/2022 0803 by Earlie Raveling, RN Outcome: Progressing Goal: Will not experience complications related to urinary retention 11/19/2022 0803 by Earlie Raveling, RN Outcome: Not Progressing 11/19/2022 0803 by Earlie Raveling, RN Outcome: Progressing   Problem: Pain Managment: Goal: General experience of comfort will improve 11/19/2022 0803 by Earlie Raveling, RN Outcome: Not Progressing 11/19/2022 0803 by Earlie Raveling, RN Outcome: Progressing   Problem: Safety: Goal: Ability to remain free from injury will improve 11/19/2022 0803 by Earlie Raveling, RN Outcome: Not Progressing 11/19/2022 0803 by Earlie Raveling, RN Outcome: Progressing   Problem: Skin Integrity: Goal: Risk for impaired skin integrity will decrease 11/19/2022 0803 by Earlie Raveling, RN Outcome: Not Progressing 11/19/2022 0803 by Earlie Raveling, RN Outcome: Progressing

## 2022-11-19 NOTE — TOC Progression Note (Addendum)
Transition of Care [facility]) - Progression Note    Patient Details  Name: Natalie Brown MRN: [id number] Date of Birth: 1965/09/12  Transition [facility]) CM/SW Contact  Inis Sizer, LCSW Phone Number: 11/19/2022, 10:00 AM  Clinical Narrative:    CSW spoke with Corrie Dandy, NP regarding patient - no updates available regarding placement at this time.  CSW reached out to [facility] at [place] - referral still pending at this time.  CSW spoke with Perlie Gold at [facility] MUST who states he is coming to evaluate patient today for a level 2 PASRR screening.   Barriers to Discharge: Financial Resources, Inadequate or no insurance, Homeless with medical needs  Expected Discharge Plan and Services In-house Referral: Clinical Social Work   Post Acute Care Choice: Nursing Home Living arrangements for the past 2 months: Homeless [facility]) Expected Discharge Date: 12/24/21                                     Social Determinants of Health (SDOH) Interventions SDOH Screenings   Food Insecurity: No Food Insecurity (03/05/2022)  Housing: High Risk (03/21/2022)  Alcohol Screen: Low Risk  (09/06/2021)  Depression (PHQ2-9): Medium Risk (05/26/2020)  Tobacco Use: High Risk (03/21/2022)    Readmission Risk Interventions     No data to display

## 2022-11-19 NOTE — Plan of Care (Signed)
  Problem: Education: Goal: Knowledge of General Education information will improve Description: Including pain rating scale, medication(s)/side effects and non-pharmacologic comfort measures 11/19/2022 0403 by Quintel Mccalla, RN Outcome: Progressing 11/19/2022 0403 by Samaiyah Howes, RN Outcome: Not Progressing   Problem: Health Behavior/Discharge Planning: Goal: Ability to manage health-related needs will improve 11/19/2022 0403 by Alynah Schone, RN Outcome: Progressing 11/19/2022 0403 by Sabryna Lahm, RN Outcome: Not Progressing   Problem: Clinical Measurements: Goal: Ability to maintain clinical measurements within normal limits will improve 11/19/2022 0403 by Jaleesa Cervi, RN Outcome: Progressing 11/19/2022 0403 by Catrice Zuleta, RN Outcome: Not Progressing Goal: Will remain free from infection 11/19/2022 0403 by Harun Brumley, RN Outcome: Progressing 11/19/2022 0403 by Sariah Henkin, RN Outcome: Progressing Goal: Diagnostic test results will improve 11/19/2022 0403 by Desyre Calma, RN Outcome: Progressing 11/19/2022 0403 by Aditri Louischarles, RN Outcome: Progressing Goal: Respiratory complications will improve 11/19/2022 0403 by Janssen Zee, RN Outcome: Progressing 11/19/2022 0403 by Casin Federici, RN Outcome: Progressing Goal: Cardiovascular complication will be avoided 11/19/2022 0403 by Nyheim Seufert, RN Outcome: Progressing 11/19/2022 0403 by Goldie Tregoning, RN Outcome: Progressing   Problem: Activity: Goal: Risk for activity intolerance will decrease 11/19/2022 0403 by Tatym Schermer, RN Outcome: Progressing 11/19/2022 0403 by Curran Lenderman, RN Outcome: Progressing   Problem: Nutrition: Goal: Adequate nutrition will be maintained 11/19/2022 0403 by Maryclare Nydam, RN Outcome: Progressing 11/19/2022 0403 by Brielyn Bosak, RN Outcome: Progressing   Problem: Coping: Goal: Level of anxiety will decrease 11/19/2022 0403 by Denajah Farias, RN Outcome: Progressing 11/19/2022 0403 by Ismael Karge, RN Outcome: Progressing   Problem:  Elimination: Goal: Will not experience complications related to bowel motility 11/19/2022 0403 by Aminat Shelburne, RN Outcome: Progressing 11/19/2022 0403 by Jerid Catherman, RN Outcome: Progressing Goal: Will not experience complications related to urinary retention 11/19/2022 0403 by Vidya Bamford, RN Outcome: Progressing 11/19/2022 0403 by Mikea Quadros, RN Outcome: Progressing   Problem: Pain Managment: Goal: General experience of comfort will improve 11/19/2022 0403 by Yetzali Weld, RN Outcome: Progressing 11/19/2022 0403 by Sherrol Vicars, RN Outcome: Progressing   Problem: Safety: Goal: Ability to remain free from injury will improve 11/19/2022 0403 by Seretha Estabrooks, RN Outcome: Progressing 11/19/2022 0403 by Jameal Razzano, RN Outcome: Progressing   Problem: Skin Integrity: Goal: Risk for impaired skin integrity will decrease 11/19/2022 0403 by Arlisa Leclere, RN Outcome: Progressing 11/19/2022 0403 by Lanora Reveron, RN Outcome: Progressing

## 2022-11-19 NOTE — Progress Notes (Signed)
  Progress Note   Patient: Natalie Brown JXB:[id number] DOB: 27-Mar-1966 DOA: 12/24/2021     328 DOS: the patient was seen and examined on 11/19/2022        Brief hospital course: Mrs. Grygiel is a 57 y.o. F with bipolar disorder who was transferred from behavioral health due to catatonia, and is now medically stabilized.          ASSESSMENT AND PLAN:  Early onset dementia  Bipolar Disorder No change - Continue Haldol, reduce dose and monitor symptoms.   Vitamin B12 Deficiency:   - Continue cyanocobalamin   Essential hypertension  BP normalized off medication   CKD Stage IIIa Cr stable   Severe protein calorie malnutrition - Continue nutrition supplements, MVI - Consult dietitian - Consult Palliative Care    SUBJECTIVE: No nursing concerns. Patient nonverbal.   OBJECTIVE: BP 119/71 (BP Location: Right Arm)   Pulse 87   Temp (!) 97.5 F (36.4 C) (Oral)   Resp 18   Ht 5\' 6"  (1.676 m)   Wt 46.3 kg   SpO2 100%   BMI 16.48 kg/m   General: Patient slow and catatonic, reaching for food on her tray, but not feeding herself, nor taking her pills as prompted Cardiovascular: RRR no murmurs  Respiratory: RR normal, lungs clear Neuro/Psych: Nonverbal, does not follow commands.                Author: Alberteen Sam, MD 11/19/2022 4:52 PM  For on call review [email or web address].

## 2022-11-20 DIAGNOSIS — R627 Adult failure to thrive: Secondary | ICD-10-CM

## 2022-11-20 DIAGNOSIS — E43 Unspecified severe protein-calorie malnutrition: Secondary | ICD-10-CM

## 2022-11-20 NOTE — Plan of Care (Signed)
  Problem: Clinical Measurements: Goal: Will remain free from infection Outcome: Progressing   Problem: Clinical Measurements: Goal: Respiratory complications will improve Outcome: Progressing   Problem: Clinical Measurements: Goal: Cardiovascular complication will be avoided Outcome: Progressing   Problem: Activity: Goal: Risk for activity intolerance will decrease Outcome: Progressing   Problem: Elimination: Goal: Will not experience complications related to bowel motility Outcome: Progressing   Problem: Safety: Goal: Ability to remain free from injury will improve Outcome: Progressing   Problem: Skin Integrity: Goal: Risk for impaired skin integrity will decrease Outcome: Progressing

## 2022-11-20 NOTE — Progress Notes (Signed)
Patient ID: Natalie Brown, female   DOB: [birth year], 57 y.o.   MRN: [id number]    Progress Note from the Palliative Medicine Team at [facility]   Patient Name: Natalie Brown        Date: 11/20/2022 DOB: [birth year]  Age: 57 y.o. MRN#: [id number] Attending Physician: Alberteen Sam, * Primary Care Physician: Lavinia Sharps, NP Admit Date: 12/24/2021    Extensive chart review has been completed prior to meeting with patient/family  including labs, vital signs, imaging, progress/consult notes, orders, medications and available advance directive documents.   57 y.o. female   admitted on 12/24/2021 medically for 12/26/2021  1:58 AM for possible catatonia/altered mental status. She carries the psychiatric diagnoses of bipolar affective disorder with psychotic symptoms and has a past medical history of hypertension.   Patient's family report dense psychiatric history going back 20 years.   Patient initially was admitted to behavioral health where she was initially admitted with mania, dementia diagnosis was prescribed at that time.   IMPRESSION:   MRI 12/24/2021 1. No acute intracranial abnormality. 2. Advanced cerebral and cerebellar atrophy.   Patient's alertness/'s cognitive state waxes and wanes day-to-day.  Today is day 330 of this hospitalization.  Difficult disposition.    Patient continues to fail to thrive.  Overall decreased  oral intake, per dietitian note she has lost 15% of her body weight in the last 3 months.   Patient does not have medical decision-making capacity.    Family face treatment option decisions, advanced directive decisions and anticipatory care needs.      This NP assessed patient at the bedside as a follow up to  [facility] meeting,   Patient is non-verbal and unable to follow commands today.   I was unsuccessful in getting Alverda to take any oral intake from me today.    I spoke to patient's mother by phone, continued conversation regarding current  medical situation.  I again today clarified the role of palliative medicine in her daughter's holistic treatment plan.  I educated on hospice benefit; philosophy and eligibility.   Ongoing education on the neurodegenerative disease of dementia and its natural trajectory. This is hard for patient's mother to hear and understand the limitations of medical interventions to treat or prolong quality of life in a young patient with severe dementia.   Mother today voices her great concerns mostly around her daughters comfort and dignity.  She wishes she could have a 24-hour companion to help bathe and feed.  She wishes she could care for her in her own home, however, her living arrangement does not allow for tenants to have anyone in the home that is not on the lease.  A  discussion was had today regarding advanced directives.  Concepts specific to code status, artifical feeding and hydration, continued IV antibiotics and rehospitalization was had.         Mother clearly states she would never want her daughter to have a feeding tube now or in the future       Mother alludes to never wanting her daughter subjected to compression shock or intubation however at this time she cannot make that a definitive decision  Goals of care note completed in [place]  Motional support offered   Education offered today regarding  the importance of continued conversation with her  family and the medical providers regarding overall plan of care and treatment options,  ensuring decisions are within the context of the patients values and GOCs.  Questions and concerns addressed   Discussed with Dr Maryfrances Bunnell    Time: 55 minutes  Detailed review of medical records ( labs, imaging, vital signs), medically appropriate exam ( MS, skin, resp)   discussed with treatment team, counseling and education to patient, family, staff, documenting clinical information, medication management, coordination of care    Lorinda Creed NP   Palliative Medicine Team Team Phone # [phone] Pager [phone]

## 2022-11-20 NOTE — Progress Notes (Signed)
Progress Note   Patient: Natalie Brown [id number]:[id number] DOB: [birth year] DOA: 12/24/2021     329 DOS: the patient was seen and examined on 11/20/2022 at 9:20AM      Brief hospital course: Natalie Brown is a 57 y.o. 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  Initially admitted to [facility] last May 2023 for agitation and aggressive behaviors.  This was treated initially as manic Bipolar, but after several months in [facility] it became clear that she had significant cognitive impairment, and dementia was suspected.  She was observed to have significant short term memory impairment and worsening hygiene, MOCA 11/30 and CT head with advanced cerebral atrophy.    In early September 2023, because of concerns of new metabolic encephalopathy (that appear to have developed acutely over ~1 day) she was transferred to acute care for further evaluation and treatment.    Here, Neurology and Psychiatry were consulted.  EEG unremarkable.  Ammonia, TSH and RPR normal.  MRI brain showed no acute findings, but confirmed advanced atrophy.  Neurology suspected a particularly aggressive premature dementia.    As expected, the patient's course here has been progression of her impairment.  While early notes imply that she was forgetful but redirectable and oriented, she is now abulic and essentially nonverbal (nursing note occasional staff or some family members can elicit more responses, but they are very limited).  She has progressed beyond the ability to bathe or toilet independently.  She holds food in the mouth, and on recent calorie count (7/26-7/29) was observed to eat 50% of meals or less.  Nursing report she does not swallow her pills regularly due to pocketing of pills or holding pills in the mouth for long periods.  In the last 6months, she has had 22 kg weight loss and has become noticeably weaker and more prone to falls when  attempting to walk (which she cannot do safely without assistance).  Palliative have been consulted and family have shared that they do not want a feeding tube. Family are aware her dementia and weight loss are terminal. Goals of care are still actively being delineated.        Assessment and Plan: Early onset dementia  Bipolar Disorder Ammonia, TSH, RPR normal.  EEG normal.  MRI brain shows advanced atrophy.  I agree with Neurology that her condition was clearly irreversible at the time of first presentation and work up for autoimmune, paraneoplastic or prion diseases would not benefit Natalie Brown   Now probably Fast 7C or D dementia. Mother and sister aware of terminal condition. - Consult Palliative Care - Continue Haldol, reduced dose week of 7/30 and monitor symptoms - If no symptoms, can reduce off later in August   Severe protein calorie malnutrition Weight loss continues.  I suspect this will be her terminal condition soon. - Check BMP weekly for hyperNa and renal function - Continue nutrition supplements, MVI as much as able - Appreciate Palliative Care and dietitian consultations     Vitamin B12 Deficiency:   - Continue cyanocobalamin   Essential hypertension  BP normalized off medication   CKD Stage IIIa Cr stable            Subjective: Nonverbal.  No new nursing concerns.  No recent falls.  Does not swallow pills today, just holds them in her mouth.     Physical Exam: BP 111/64 (BP Location: Right Arm)   Pulse 98  Temp (!) 97.5 F (36.4 C) (Oral)   Resp 18   Ht 5\' 6"  (1.676 m)   Wt 46.3 kg   SpO2 100%   BMI 16.48 kg/m   General: Patient slow and catatonic, reaching for food on her tray, but not feeding herself, nor taking her pills as prompted Cardiovascular: RRR no murmurs  Respiratory: RR normal, lungs clear Neuro/Psych: Nonverbal, does not follow commands.  Unable to sit up properly, falls onto her back.   Family Communication: None  today    Disposition: Status is: Inpatient The patient was admitted for concern for encephalopathy.  Encephalopathy was ruled out, and it appears she has advancing dementia to end stage.  [name] currently custodial, but because she is homeless, she does not have safe disposition for discharge.  TOC working towards LTC        Author: Alberteen Sam, MD 11/20/2022 1:36 PM  For on call review [email or web address].

## 2022-11-20 NOTE — Progress Notes (Signed)
Nutrition Follow-up  DOCUMENTATION CODES:   Severe malnutrition in context of chronic illness  INTERVENTION:  - If family decides for comfort approach, RD recommends the following:    - Continue Dys 3 diet.    - Support with 1:1 feedings    - Continue Ensure Enlive q day, each supplement provides 350 kcal and 20 grams of protein/ and MVI q day.   - If family decides to move forward with aggressive measures (I.e. PEG placement), RD recommends the following regimen:     - pt is at high re-feeding risk, recommend slow advancement of tube feeds.     - Consider additional thiamine supplementation.    - Consider 2 cans per day for 2-3 days before advancing to full regimen.     - Goal TF regimen:     - 4 cans of Jevity 1.5 per day + TF20 prosource- This will provide (948 mL) 1502 kcals, 80 gm protein, 720 mL fluid. FWF: 75 mL before and after each can.   NUTRITION DIAGNOSIS:   Severe Malnutrition related to chronic illness (advanced dementia) as evidenced by energy intake < 75% for > or equal to 1 month, percent weight loss.  GOAL:   Patient will meet greater than or equal to 90% of their needs - Not met.   MONITOR:   PO intake, Supplement acceptance  REASON FOR ASSESSMENT:   LOS    ASSESSMENT:   57 y.o. female admits related to AMS. PMH includes: Bipolar disorde rand HTN. Pt is currently receiving medical management related to early onset dementia with behavioral disturbances.  Meds reviewed: Vit B12, MVI, Miralax, senokot. Labs reviewed: WDL.   Pt continues with poor PO intakes. Palliative care met with the family and MD to have goals of care discussion. Per Palliative Care, the family was taking time to discuss and think about the plan moving forward in deciding aggressive measure or comfort approach. RD does not recommend feeding tubes in patients with advanced dementia. However RD will place tube feed recommendations, if family does decide to go with a more aggressive  approach. RD will continue to closely monitor POC. RD discussed with MD.   Diet Order:   Diet Order             DIET DYS 3 Room service appropriate? Yes; Fluid consistency: Thin  Diet effective now                   EDUCATION NEEDS:   Not appropriate for education at this time  Skin:  Skin Assessment: Reviewed RN Assessment  Last BM:  7/31 - type 5  Height:   Ht Readings from Last 1 Encounters:  02/03/22 5\' 6"  (1.676 m)    Weight:   Wt Readings from Last 1 Encounters:  11/17/22 46.3 kg    Ideal Body Weight:     BMI:  Body mass index is 16.48 kg/m.  Estimated Nutritional Needs:   Kcal:  1400-1700 kcals  Protein:  70-85 gm  Fluid:  >/= 1.4 L  Bethann Humble, RD, LDN, CNSC.

## 2022-11-20 NOTE — Progress Notes (Signed)
During shift change patient was very restless and climbed out of bed 6 times, needing redirection and assistance back into bed. RN attempted to administer morning medications, including scheduled Haldol. RN crushed medications and put them in applesauce. Patient took apple sauce in mouth but the refused to swallow or spit the apple sauce out. RN gave patient juice to help wash down the apple sauce but patient refused to swallow any juice. RN attempted throughout day to get patient to take medications but patient would not cooperate.

## 2022-11-21 DIAGNOSIS — E43 Unspecified severe protein-calorie malnutrition: Secondary | ICD-10-CM

## 2022-11-21 NOTE — Progress Notes (Signed)
Progress Note   Patient: Natalie Brown ZOX:[id number] DOB: [birth year] DOA: 12/24/2021     330 DOS: the patient was seen and examined on 11/21/2022 at 9:20AM      Brief hospital course: Natalie Brown is a 57 y.o. 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  Initially admitted to [facility] last May 2023 for agitation and aggressive behaviors.  This was treated initially as manic Bipolar, but after several months in [facility] it became clear that she had significant cognitive impairment, and dementia was suspected.  She was observed to have significant short term memory impairment and worsening hygiene, MOCA 11/30 and CT head with advanced cerebral atrophy.    In early September 2023, because of concerns of new metabolic encephalopathy (that appear to have developed acutely over ~1 day) she was transferred to acute care for further evaluation and treatment.    Here, Neurology and Psychiatry were consulted.  EEG unremarkable.  Ammonia, TSH and RPR normal.  MRI brain showed no acute findings, but confirmed advanced atrophy.  Neurology suspected a particularly aggressive premature dementia.    As expected, the patient's course here has been progression of her impairment.  While early notes imply that she was forgetful but redirectable and oriented, she is now abulic and essentially nonverbal (nursing note occasional staff or some family members can elicit more responses, but they are very limited).  She has progressed beyond the ability to bathe or toilet independently.  She holds food in the mouth, and on recent calorie count (7/26-7/29) was observed to eat 50% of meals or less.  Nursing report she does not swallow her pills regularly due to pocketing of pills or holding pills in the mouth for long periods.  In the last 6months, she has had 22 kg weight loss and has become noticeably weaker and more prone to falls when  attempting to walk (which she cannot do safely without assistance).  Palliative have been consulted and family have shared that they do not want a feeding tube. Family are aware her dementia and weight loss are terminal. Goals of care are still actively being delineated.  8/1: Patient remains same and appears quite catatonic, no response only some spontaneous blinking of eyes.  Assessment and Plan: Early onset dementia  Bipolar Disorder Ammonia, TSH, RPR normal.  EEG normal.  MRI brain shows advanced atrophy.  I agree with Neurology that her condition was clearly irreversible at the time of first presentation and work up for autoimmune, paraneoplastic or prion diseases would not benefit Natalie Brown   Now probably Fast 7C or D dementia. Mother and sister aware of terminal condition. - Consult Palliative Care - Continue Haldol, reduced dose week of 7/30 and monitor symptoms - If no symptoms, can reduce off later in August   Severe protein calorie malnutrition Weight loss continues.  I suspect this will be her terminal condition soon. - Check BMP weekly for hyperNa and renal function - Continue nutrition supplements, MVI as much as able - Appreciate Palliative Care and dietitian consultations     Vitamin B12 Deficiency:   - Continue cyanocobalamin   Essential hypertension  BP normalized off medication   CKD Stage IIIa Cr stable        Subjective: Patient with no new concern from nursing staff.  Remains same and nonverbal.  Physical Exam: BP (!) 84/55 (BP Location: Right Arm)   Pulse 66   Temp 97.6 F (  36.4 C)   Resp 17   Ht 5\' 6"  (1.676 m)   Wt 46.3 kg   SpO2 100%   BMI 16.48 kg/m   General.  Malnourished, catatonic lady, in no acute distress. Pulmonary.  Lungs clear bilaterally, normal respiratory effort. CV.  Regular rate and rhythm, no JVD, rub or murmur. Abdomen.  Soft, nontender, nondistended, BS positive. CNS.  Awake but not responding, only spontaneous eye  blinking. Extremities.  No edema, no cyanosis, pulses intact and symmetrical.  Family Communication: None today  Disposition: Status is: Inpatient The patient was admitted for concern for encephalopathy.  Encephalopathy was ruled out, and it appears she has advancing dementia to end stage.  [name] currently custodial, but because she is homeless, she does not have safe disposition for discharge.  TOC working towards LTC    Author: Arnetha Courser, MD 11/21/2022 1:52 PM  For on call review [email or web address].

## 2022-11-21 NOTE — TOC Progression Note (Signed)
Transition of Care [facility]) - Progression Note    Patient Details  Name: Natalie Brown MRN: [id number] Date of Birth: 22-Dec-1965  Transition of Care [facility]) CM/SW Contact  Inis Sizer, LCSW Phone Number: 11/21/2022, 12:40 PM  Clinical Narrative:    CSW received patient's PASRR - #[id number] H.     Barriers to Discharge: Financial Resources, Inadequate or no insurance, Homeless with medical needs  Expected Discharge Plan and Services In-house Referral: Clinical Social Work   Post Acute Care Choice: Nursing Home Living arrangements for the past 2 months: Homeless [facility]) Expected Discharge Date: 12/24/21                                     Social Determinants of Health (SDOH) Interventions SDOH Screenings   Food Insecurity: No Food Insecurity (03/05/2022)  Housing: High Risk (03/21/2022)  Alcohol Screen: Low Risk  (09/06/2021)  Depression (PHQ2-9): Medium Risk (05/26/2020)  Tobacco Use: High Risk (03/21/2022)    Readmission Risk Interventions     No data to display

## 2022-11-22 NOTE — Progress Notes (Signed)
Reported to me by other RN on shift that patient was seen close to door in attempt to leave unit. Patient was redirected to bed with assistance from staff.

## 2022-11-22 NOTE — Progress Notes (Signed)
Progress Note   Patient: Natalie Brown [id number]:[id number] DOB: 1965-10-11 DOA: 12/24/2021     331 DOS: the patient was seen and examined on 11/22/2022 at 9:20AM      Brief hospital course: Natalie Brown is a 57 y.o. 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  Initially admitted to [facility] last May 2023 for agitation and aggressive behaviors.  This was treated initially as manic Bipolar, but after several months in [facility] it became clear that she had significant cognitive impairment, and dementia was suspected.  She was observed to have significant short term memory impairment and worsening hygiene, MOCA 11/30 and CT head with advanced cerebral atrophy.    In early September 2023, because of concerns of new metabolic encephalopathy (that appear to have developed acutely over ~1 day) she was transferred to acute care for further evaluation and treatment.    Here, Neurology and Psychiatry were consulted.  EEG unremarkable.  Ammonia, TSH and RPR normal.  MRI brain showed no acute findings, but confirmed advanced atrophy.  Neurology suspected a particularly aggressive premature dementia.    As expected, the patient's course here has been progression of her impairment.  While early notes imply that she was forgetful but redirectable and oriented, she is now abulic and essentially nonverbal (nursing note occasional staff or some family members can elicit more responses, but they are very limited).  She has progressed beyond the ability to bathe or toilet independently.  She holds food in the mouth, and on recent calorie count (7/26-7/29) was observed to eat 50% of meals or less.  Nursing report she does not swallow her pills regularly due to pocketing of pills or holding pills in the mouth for long periods.  In the last 6months, she has had 22 kg weight loss and has become noticeably weaker and more prone to falls when  attempting to walk (which she cannot do safely without assistance).  Palliative have been consulted and family have shared that they do not want a feeding tube. Family are aware her dementia and weight loss are terminal. Goals of care are still actively being delineated.  11/22/22: No new changes.    Assessment and Plan: Early onset dementia  Bipolar Disorder Ammonia, TSH, RPR normal.  EEG normal.  MRI brain shows advanced atrophy.  I agree with Neurology that her condition was clearly irreversible at the time of first presentation and work up for autoimmune, paraneoplastic or prion diseases would not benefit Natalie Brown   Now probably Fast 7C or D dementia. Mother and sister aware of terminal condition. - Consult Palliative Care - Continue Haldol, reduced dose week of 7/30 and monitor symptoms - If no symptoms, can reduce off later in August   Severe protein calorie malnutrition Weight loss continues.  I suspect this will be her terminal condition soon. - Check BMP weekly for hyperNa and renal function - Continue nutrition supplements, MVI as much as able - Appreciate Palliative Care and dietitian consultations     Vitamin B12 Deficiency:   - Continue cyanocobalamin   Essential hypertension  BP normalized off medication   CKD Stage IIIa Cr stable        Subjective: Patient with no new concern from nursing staff.  Remains same and nonverbal.  Physical Exam: BP 106/66 (BP Location: Right Arm)   Pulse (!) 58   Temp (!) 97 F (36.1 C)   Resp 18   Ht  5\' 6"  (1.676 m)   Wt 46.3 kg   SpO2 100%   BMI 16.48 kg/m   General.  Malnourished. Pulmonary.  Clear. CV.  S1-S2.  Abdomen.  Soft, nontender, nondistended, BS positive. CNS.  Awake but not responding, only spontaneous eye blinking. Extremities.  No edema, no cyanosis, pulses intact and symmetrical.  Family Communication:   Disposition: Status is: Inpatient The patient was admitted for concern for encephalopathy.   Encephalopathy was ruled out, and it appears she has advancing dementia to end stage.  [name] currently custodial, but because she is homeless, she does not have safe disposition for discharge.  TOC working towards LTC    Author: Barnetta Chapel, MD 11/22/2022 9:41 AM  For on call review [email or web address].

## 2022-11-23 DIAGNOSIS — G9341 Metabolic encephalopathy: Secondary | ICD-10-CM | POA: Diagnosis not present

## 2022-11-23 DIAGNOSIS — E43 Unspecified severe protein-calorie malnutrition: Secondary | ICD-10-CM | POA: Diagnosis not present

## 2022-11-23 DIAGNOSIS — Z23 Encounter for immunization: Secondary | ICD-10-CM | POA: Diagnosis not present

## 2022-11-23 DIAGNOSIS — Z66 Do not resuscitate: Secondary | ICD-10-CM | POA: Diagnosis not present

## 2022-11-23 MED ORDER — HALOPERIDOL LACTATE 5 MG/ML IJ SOLN
2.0000 mg | Freq: Once | INTRAMUSCULAR | Status: AC | PRN
  Administered 2022-11-23: 2 mg via INTRAMUSCULAR
  Filled 2022-11-23: qty 1

## 2022-11-23 MED ORDER — HALOPERIDOL 1 MG PO TABS: 2.0000 mg | ORAL_TABLET | Freq: Once | ORAL | Status: AC | PRN

## 2022-11-23 NOTE — Progress Notes (Signed)
Patient has been restless and constantly getting out of the bed and walking on the bed. Patient is a high fall risk with unsteady gait. Reached out to on call MD for further interventions, obtained new orders for EKG and a one time order for Haldol. It took 4 Staff members to hold pt down so we can get an acurate EKG. Patient was able to settle down after IM Haldol. Patient has new orders for 1:1 safety sitter.

## 2022-11-23 NOTE — Progress Notes (Signed)
Progress Note   Patient: Natalie Brown ZOX:[id number] DOB: [birth year] DOA: 12/24/2021     332 DOS: the patient was seen and examined on 11/23/2022 at 9:20AM      Brief hospital course: Natalie Brown is a 57 y.o. 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  Initially admitted to [facility] last May 2023 for agitation and aggressive behaviors.  This was treated initially as manic Bipolar, but after several months in [facility] it became clear that she had significant cognitive impairment, and dementia was suspected.  She was observed to have significant short term memory impairment and worsening hygiene, MOCA 11/30 and CT head with advanced cerebral atrophy.    In early September 2023, because of concerns of new metabolic encephalopathy (that appear to have developed acutely over ~1 day) she was transferred to acute care for further evaluation and treatment.    Here, Neurology and Psychiatry were consulted.  EEG unremarkable.  Ammonia, TSH and RPR normal.  MRI brain showed no acute findings, but confirmed advanced atrophy.  Neurology suspected a particularly aggressive premature dementia.    As expected, the patient's course here has been progression of her impairment.  While early notes imply that she was forgetful but redirectable and oriented, she is now abulic and essentially nonverbal (nursing note occasional staff or some family members can elicit more responses, but they are very limited).  She has progressed beyond the ability to bathe or toilet independently.  She holds food in the mouth, and on recent calorie count (7/26-7/29) was observed to eat 50% of meals or less.  Nursing report she does not swallow her pills regularly due to pocketing of pills or holding pills in the mouth for long periods.  In the last 6months, she has had 22 kg weight loss and has become noticeably weaker and more prone to falls when  attempting to walk (which she cannot do safely without assistance).  Palliative have been consulted and family have shared that they do not want a feeding tube. Family are aware her dementia and weight loss are terminal. Goals of care are still actively being delineated.  11/23/22: No new changes.    Assessment and Plan: Early onset dementia  Bipolar Disorder Ammonia, TSH, RPR normal.  EEG normal.  MRI brain shows advanced atrophy.  I agree with Neurology that her condition was clearly irreversible at the time of first presentation and work up for autoimmune, paraneoplastic or prion diseases would not benefit Natalie Brown   Now probably Fast 7C or D dementia. Mother and sister aware of terminal condition. - Consult Palliative Care - Continue Haldol, reduced dose week of 7/30 and monitor symptoms - If no symptoms, can reduce off later in August   Severe protein calorie malnutrition Weight loss continues.  I suspect this will be her terminal condition soon. - Check BMP weekly for hyperNa and renal function - Continue nutrition supplements, MVI as much as able - Appreciate Palliative Care and dietitian consultations     Vitamin B12 Deficiency:   - Continue cyanocobalamin   Essential hypertension  BP normalized off medication   CKD Stage IIIa Cr stable        Subjective:  No history from patient.   No meaningful conversation.    Physical Exam: BP 120/78 (BP Location: Right Arm)   Pulse (!) 58   Temp 98.7 F (37.1 C) (Oral)   Resp 18   Ht 5'  6" (1.676 m)   Wt 46.3 kg   SpO2 100%   BMI 16.48 kg/m   General.  Malnourished. Pulmonary.  Clear. CV.  S1-S2.  Abdomen.  Soft, nontender, nondistended, BS positive. CNS.  Awake but not responding, only spontaneous eye blinking. Extremities.  No edema, no cyanosis, pulses intact and symmetrical.  Family Communication:   Disposition: Status is: Inpatient The patient was admitted for concern for encephalopathy.  Encephalopathy  was ruled out, and it appears she has advancing dementia to end stage.  [name] currently custodial, but because she is homeless, she does not have safe disposition for discharge.  TOC working towards LTC    Author: Barnetta Chapel, MD 11/23/2022 1:52 PM  For on call review [email or web address].

## 2022-11-24 NOTE — Plan of Care (Signed)
  Problem: Clinical Measurements: Goal: Ability to maintain clinical measurements within normal limits will improve Outcome: Progressing Goal: Will remain free from infection Outcome: Progressing Goal: Diagnostic test results will improve Outcome: Progressing Goal: Respiratory complications will improve Outcome: Progressing   Problem: Elimination: Goal: Will not experience complications related to bowel motility Outcome: Progressing

## 2022-11-24 NOTE — Progress Notes (Signed)
Progress Note   Patient: Natalie Brown [name]:[id number] DOB: 06-06-1965 DOA: 12/24/2021     333 DOS: the patient was seen and examined on 11/24/2022 at 9:20AM      Brief hospital course: Patient is a 57 year old African-American female, past medical history significant for bipolar disorder and documented early onset dementia.  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  Patient is now medically stabilized.  Patient was initially admitted to [facility] last May 2023 with agitation and aggressive behaviors.  This was treated initially as manic Bipolar, but after several months in [facility] it became clear that she had significant cognitive impairment, and dementia was suspected.  Patient was observed to have significant short term memory impairment and worsening hygiene, MOCA 11/30 and CT head revealed advanced cerebral atrophy.    In early September 2023, patient was transferred for further evaluation and treatment due to concerns of new metabolic encephalopathy (that appear to have developed acutely over ~1 day).  Neurology and Psychiatry were consulted.  EEG was unremarkable.  Ammonia, TSH and RPR were normal.  MRI brain revealed no acute findings, but confirmed advanced atrophy.  Neurology suspected a particularly aggressive premature dementia.    The patient has had progressive improvement.  While early notes implied that patient was forgetful but redirectable and oriented, she is now abulic and essentially nonverbal (nursing note occasional staff or some family members can elicit more responses, but they are very limited).  Patient has progressed beyond the ability to bathe or toilet independently.  Patient holds food in the mouth, and on recent calorie count (7/26-7/29) was observed to eat 50% of meals or less.  Nursing report she does not swallow her pills regularly due to pocketing of pills or holding pills in the mouth for long periods.  In the last 6months, she has had 22 kg weight loss  and has become noticeably weaker and more prone to falls when attempting to walk (which she cannot do safely without assistance).  Palliative have been consulted and family have shared that they do not want a feeding tube. Family are aware her dementia and weight loss are terminal. Goals of care are still actively being delineated.  11/24/22: No new changes.    Assessment and Plan: Early onset dementia  Bipolar Disorder Ammonia, TSH, RPR normal.  EEG normal.  MRI brain shows advanced atrophy.  I agree with Neurology that her condition was clearly irreversible at the time of first presentation and work up for autoimmune, paraneoplastic or prion diseases would not benefit Natalie Brown   Now probably Fast 7C or D dementia. Mother and sister aware of terminal condition. - Consult Palliative Care - Continue Haldol, reduced dose week of 7/30 and monitor symptoms - If no symptoms, can reduce off later in August   Severe protein calorie malnutrition Weight loss continues.  I suspect this will be her terminal condition soon. - Check BMP weekly for hyperNa and renal function - Continue nutrition supplements, MVI as much as able - Appreciate Palliative Care and dietitian consultations     Vitamin B12 Deficiency:   - Continue cyanocobalamin   Essential hypertension  BP normalized off medication   CKD Stage IIIa Cr stable        Subjective:  No history from patient.   No meaningful conversation.    Physical Exam: BP (!) 108/94 (BP Location: Right Arm)   Pulse (!) 111   Temp 97.6 F (36.4 C)   Resp 18   Ht 5\' 6"  (1.676  m)   Wt 46.3 kg   SpO2 100%   BMI 16.48 kg/m   General.  Malnourished. Pulmonary.  Clear. CV.  S1-S2.  Abdomen.  Soft, nontender, nondistended, BS positive. CNS.  Awake but not responding, only spontaneous eye blinking. Extremities.  No edema, no cyanosis, pulses intact and symmetrical.  Family Communication:   Disposition: Status is: Inpatient The patient was  admitted for concern for encephalopathy.  Encephalopathy was ruled out, and it appears she has advancing dementia to end stage.  [name] currently custodial, but because she is homeless, she does not have safe disposition for discharge.  TOC working towards LTC    Author: Barnetta Chapel, MD 11/24/2022 2:19 PM  For on call review [email or web address].

## 2022-11-25 NOTE — Progress Notes (Signed)
Progress Note   Patient: Natalie Brown:[id number] DOB: 1965-10-06 DOA: 12/24/2021     334 DOS: the patient was seen and examined on 11/25/2022 at 9:20AM      Brief hospital course: Patient is a 57 year old African-American female, past medical history significant for bipolar disorder and documented early onset dementia.  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  Patient is now medically stabilized.  Patient was initially admitted to [facility] last May 2023 with agitation and aggressive behaviors.  This was treated initially as manic Bipolar, but after several months in [facility] it became clear that she had significant cognitive impairment, and dementia was suspected.  Patient was observed to have significant short term memory impairment and worsening hygiene, MOCA 11/30 and CT head revealed advanced cerebral atrophy.    In early September 2023, patient was transferred for further evaluation and treatment due to concerns of new metabolic encephalopathy (that appear to have developed acutely over ~1 day).  Neurology and Psychiatry were consulted.  EEG was unremarkable.  Ammonia, TSH and RPR were normal.  MRI brain revealed no acute findings, but confirmed advanced atrophy.  Neurology suspected a particularly aggressive premature dementia.    The patient has had progressive improvement.  While early notes implied that patient was forgetful but redirectable and oriented, she is now abulic and essentially nonverbal (nursing note occasional staff or some family members can elicit more responses, but they are very limited).  Patient has progressed beyond the ability to bathe or toilet independently.  Patient holds food in the mouth, and on recent calorie count (7/26-7/29) was observed to eat 50% of meals or less.  Nursing report she does not swallow her pills regularly due to pocketing of pills or holding pills in the mouth for long periods.  In the last 6months, she has had 22 kg weight loss  and has become noticeably weaker and more prone to falls when attempting to walk (which she cannot do safely without assistance).  Palliative have been consulted and family have shared that they do not want a feeding tube. Family are aware her dementia and weight loss are terminal. Goals of care are still actively being delineated.  11/25/22: No new changes.    Assessment and Plan: Early onset dementia  Bipolar Disorder Ammonia, TSH, RPR normal.  EEG normal.  MRI brain shows advanced atrophy.  I agree with Neurology that her condition was clearly irreversible at the time of first presentation and work up for autoimmune, paraneoplastic or prion diseases would not benefit Natalie Brown   Now probably Fast 7C or D dementia. Mother and sister aware of terminal condition. - Consult Palliative Care - Continue Haldol, reduced dose week of 7/30 and monitor symptoms - If no symptoms, can reduce off later in August   Severe protein calorie malnutrition Weight loss continues.  I suspect this will be her terminal condition soon. - Check BMP weekly for hyperNa and renal function - Continue nutrition supplements, MVI as much as able - Appreciate Palliative Care and dietitian consultations     Vitamin B12 Deficiency:   - Continue cyanocobalamin   Essential hypertension  BP normalized off medication   CKD Stage IIIa Cr stable        Subjective:  No history from patient.   No meaningful conversation.    Physical Exam: BP 118/83 (BP Location: Right Arm)   Pulse (!) 107   Temp 97.6 F (36.4 C) (Axillary)   Resp 16   Ht 5\' 6"  (1.676  m)   Wt 46.3 kg   SpO2 94%   BMI 16.48 kg/m   General.  Malnourished. Pulmonary.  Clear. CV.  S1-S2.  Abdomen.  Soft, nontender, nondistended, BS positive. CNS.  Awake but not responding, only spontaneous eye blinking. Extremities.  No edema, no cyanosis, pulses intact and symmetrical.  Family Communication:   Disposition: Status is: Inpatient The  patient was admitted for concern for encephalopathy.  Encephalopathy was ruled out, and it appears she has advancing dementia to end stage.  [name] currently custodial, but because she is homeless, she does not have safe disposition for discharge.  TOC working towards LTC    Author: Barnetta Chapel, MD 11/25/2022 4:36 PM  For on call review [email or web address].

## 2022-11-25 NOTE — Plan of Care (Signed)
  Problem: Pain Managment: Goal: General experience of comfort will improve Outcome: Progressing   Problem: Safety: Goal: Ability to remain free from injury will improve Outcome: Progressing   Problem: Skin Integrity: Goal: Risk for impaired skin integrity will decrease Outcome: Progressing   

## 2022-11-25 NOTE — TOC Progression Note (Addendum)
Transition of Care [facility]) - Progression Note    Patient Details  Name: Natalie Brown MRN: [id number] Date of Birth: [birth year]  Transition of Care [facility]) CM/SW Contact  Inis Sizer, LCSW Phone Number: 11/25/2022, 1:01 PM  Clinical Narrative:    2:30pm: CSW spoke with Alinda Money at [facility] who states patient is eligible for a program change but her legal guardian has to call to make the change.  CSW spoke with patient's mother [name] to inform her of information. [name] requested CSW email instructions to her - email sent.  1pm: There are no discharge planning updates available at this time.  CSW sent secure email to Everardo Beals at [facility] DSS requesting a program change from [facility] to traditional Medicaid to support placement.    Barriers to Discharge: Financial Resources, Inadequate or no insurance, Homeless with medical needs  Expected Discharge Plan and Services In-house Referral: Clinical Social Work   Post Acute Care Choice: Nursing Home Living arrangements for the past 2 months: Homeless [facility]) Expected Discharge Date: 12/24/21                                     Social Determinants of Health (SDOH) Interventions SDOH Screenings   Food Insecurity: No Food Insecurity (03/05/2022)  Housing: High Risk (03/21/2022)  Alcohol Screen: Low Risk  (09/06/2021)  Depression (PHQ2-9): Medium Risk (05/26/2020)  Tobacco Use: High Risk (03/21/2022)    Readmission Risk Interventions     No data to display

## 2022-11-26 NOTE — Plan of Care (Signed)

## 2022-11-26 NOTE — Progress Notes (Signed)
PROGRESS NOTE    Natalie Brown  IHK:[id number] DOB: [birth year] DOA: 12/24/2021 PCP: Natalie Sharps, NP   Brief Narrative:    Patient is a 57 year old African-American female, past medical history significant for bipolar disorder and documented early onset dementia.  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  Patient is now medically stabilized.   Patient was initially admitted to [facility] last May 2023 with agitation and aggressive behaviors.  This was treated initially as manic Bipolar, but after several months in [facility] it became clear that she had significant cognitive impairment, and dementia was suspected.  Patient was observed to have significant short term memory impairment and worsening hygiene, MOCA 11/30 and CT head revealed advanced cerebral atrophy.     In early September 2023, patient was transferred for further evaluation and treatment due to concerns of new metabolic encephalopathy (that appear to have developed acutely over ~1 day).  Neurology and Psychiatry were consulted.  EEG was unremarkable.  Ammonia, TSH and RPR were normal.  MRI brain revealed no acute findings, but confirmed advanced atrophy.  Neurology suspected a particularly aggressive premature dementia.     The patient has had progressive improvement.  While early notes implied that patient was forgetful but redirectable and oriented, she is now abulic and essentially nonverbal (nursing note occasional staff or some family members can elicit more responses, but they are very limited).  Patient has progressed beyond the ability to bathe or toilet independently.  Patient holds food in the mouth, and on recent calorie count (7/26-7/29) was observed to eat 50% of meals or less.  Nursing report she does not swallow her pills regularly due to pocketing of pills or holding pills in the mouth for long periods.  In the last 6months, she has had 22 kg weight loss and has become noticeably weaker and more prone to  falls when attempting to walk (which she cannot do safely without assistance).   Palliative have been consulted and family have shared that they do not want a feeding tube. Family are aware her dementia and weight loss are terminal. Goals of care are still actively being delineated.  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  EEG normal.  MRI brain shows advanced atrophy.  I agree with Neurology that her condition was clearly irreversible at the time of first presentation and work up for autoimmune, paraneoplastic or prion diseases would not benefit Natalie Brown    Now probably Fast 7C or D dementia. Mother and sister aware of terminal condition. - Consult Palliative Care - Continue Haldol, reduced dose week of 7/30 and monitor symptoms - If no symptoms, can reduce off later in August     Severe protein calorie malnutrition Weight loss continues.  I suspect this will be her terminal condition soon. - Check BMP weekly for hyperNa and renal function - Continue nutrition supplements, MVI as much as able - Appreciate Palliative Care and dietitian consultations      Vitamin B12 Deficiency:   - Continue cyanocobalamin  Essential hypertension  BP normalized off medication   CKD Stage IIIa Cr stable   DVT prophylaxis:Lovenox Code Status: Full Family Communication: None Disposition Plan: Awaiting dispo Status is: Inpatient Remains inpatient appropriate because: Awaiting dispo.  Procedures:   None  Antimicrobials:  None  Subjective: Patient seen and evaluated today with no new acute complaints or concerns. No acute concerns or events noted overnight. Sitter at bedside.  Objective: Vitals:   11/25/22 0618 11/25/22 1800 11/25/22 2025 11/26/22 0614  BP: 118/83 105/89 102/67 108/80  Pulse: (!) 107 96 80 (!) 114  Resp: 16 17 16 16   Temp: 97.6 F (36.4 C)  98.5 F (36.9 C) 98 F (36.7 C)  TempSrc: Axillary   Oral  SpO2: 94%  100%   Weight:      Height:       No intake or output data in the 24 hours ending 11/26/22 0642 Filed Weights   11/12/22 0625 11/13/22 0500 11/17/22 0554  Weight: 49.4 kg 48.5 kg 46.3 kg    Examination:  General exam: Appears calm and comfortable, unresponsive to questioning Respiratory system: Clear to auscultation. Respiratory effort normal. Cardiovascular system: S1 & S2 heard, RRR.  Gastrointestinal system: Abdomen is soft Central nervous system: Alert and awake Extremities: No edema Skin: No significant lesions noted Psychiatry: Flat affect.    Data Reviewed: I have personally reviewed following labs and imaging studies  CBC: No results for input(s): "WBC", "NEUTROABS", "HGB", "HCT", "MCV", "PLT" in the last 168 hours. Basic Metabolic Panel: No results for input(s): "NA", "K", "CL", "CO2", "GLUCOSE", "BUN", "CREATININE", "CALCIUM", "MG", "PHOS" in the last 168 hours. GFR: Estimated Creatinine Clearance: 46.5 mL/min (by C-G formula based on SCr of 1 mg/dL). Liver Function Tests: No results for input(s): "AST", "ALT", "ALKPHOS", "BILITOT", "PROT", "ALBUMIN" in the last 168 hours. No results for input(s): "LIPASE", "AMYLASE" in the last 168 hours. No results for input(s): "AMMONIA" in the last 168 hours. Coagulation Profile: No results for input(s): "INR", "PROTIME" in the last 168 hours. Cardiac Enzymes: No results for input(s): "CKTOTAL", "CKMB", "CKMBINDEX", "TROPONINI" in the last 168 hours. BNP (last 3 results) No results  for input(s): "PROBNP" in the last 8760 hours. HbA1C: No results for input(s): "HGBA1C" in the last 72 hours. CBG: No results for input(s): "GLUCAP" in the last 168 hours. Lipid Profile: No results for input(s): "CHOL", "HDL", "LDLCALC", "TRIG", "CHOLHDL", "LDLDIRECT" in the last 72 hours. Thyroid Function Tests: No results for input(s): "TSH", "T4TOTAL", "FREET4", "T3FREE", "THYROIDAB" in the last 72 hours. Anemia Panel: No results for input(s): "VITAMINB12", "FOLATE", "FERRITIN", "TIBC", "IRON", "RETICCTPCT" in the last 72 hours. Sepsis Labs: No results for input(s): "PROCALCITON", "LATICACIDVEN" in the last 168 hours.  No results found for this or any previous visit (from the past 240 hour(s)).       Radiology Studies: No results found.      Scheduled Meds:  (feeding supplement) PROSource Plus  30 mL Oral BID BM   vitamin B-12  500 mcg Oral Daily   enoxaparin (LOVENOX) injection  40 mg Subcutaneous Daily   feeding supplement  237 mL Oral BID BM   haloperidol  1 mg Oral BID   hydrocerin   Topical BID   multivitamin with minerals  1 tablet Oral Daily   polyethylene glycol  17 g Oral Daily   senna-docusate  2 tablet Oral QHS   Tdap  0.5 mL Intramuscular Once     LOS: 335 days    Time spent: 35 minutes   Hoover Brunette, DO [facility]  If 7PM-7AM, please contact night-coverage [email or web address] 11/26/2022, 6:42 AM

## 2022-11-27 NOTE — TOC Progression Note (Signed)
Transition of Care [facility]) - Progression Note    Patient Details  Name: Natalie Brown MRN: [id number] Date of Birth: Jun 23, 1965  Transition of Care [facility]) CM/SW Contact  Inis Sizer, LCSW Phone Number: 11/27/2022, 10:03 AM  Clinical Narrative:    CSW spoke with patient's mother [name] who states she attempted to change patient's Medicaid program but was unsuccessful after being told patient was only eligible for a managed Medicaid plan. [name] states she will return call to [facility] hotline and will return call to CSW with updates.     Barriers to Discharge: Financial Resources, Inadequate or no insurance, Homeless with medical needs  Expected Discharge Plan and Services In-house Referral: Clinical Social Work   Post Acute Care Choice: Nursing Home Living arrangements for the past 2 months: Homeless [facility]) Expected Discharge Date: 12/24/21                                     Social Determinants of Health (SDOH) Interventions SDOH Screenings   Food Insecurity: No Food Insecurity (03/05/2022)  Housing: High Risk (03/21/2022)  Alcohol Screen: Low Risk  (09/06/2021)  Depression (PHQ2-9): Medium Risk (05/26/2020)  Tobacco Use: High Risk (03/21/2022)    Readmission Risk Interventions     No data to display

## 2022-11-27 NOTE — Progress Notes (Signed)
PROGRESS NOTE    Natalie Brown  GEX:[id number] DOB: [birth year] DOA: 12/24/2021 PCP: Natalie Sharps, NP   Brief Narrative:    Patient is a 57 year old African-American female, past medical history significant for bipolar disorder and documented early onset dementia.  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  Patient is now medically stabilized.   Patient was initially admitted to [facility] last May 2023 with agitation and aggressive behaviors.  This was treated initially as manic Bipolar, but after several months in [facility] it became clear that she had significant cognitive impairment, and dementia was suspected.  Patient was observed to have significant short term memory impairment and worsening hygiene, MOCA 11/30 and CT head revealed advanced cerebral atrophy.     In early September 2023, patient was transferred for further evaluation and treatment due to concerns of new metabolic encephalopathy (that appear to have developed acutely over ~1 day).  Neurology and Psychiatry were consulted.  EEG was unremarkable.  Ammonia, TSH and RPR were normal.  MRI brain revealed no acute findings, but confirmed advanced atrophy.  Neurology suspected a particularly aggressive premature dementia.     The patient has had progressive improvement.  While early notes implied that patient was forgetful but redirectable and oriented, she is now abulic and essentially nonverbal (nursing note occasional staff or some family members can elicit more responses, but they are very limited).  Patient has progressed beyond the ability to bathe or toilet independently.  Patient holds food in the mouth, and on recent calorie count (7/26-7/29) was observed to eat 50% of meals or less.  Nursing report she does not swallow her pills regularly due to pocketing of pills or holding pills in the mouth for long periods.  In the last 6months, she has had 22 kg weight loss and has become noticeably weaker and more prone to  falls when attempting to walk (which she cannot do safely without assistance).   Palliative have been consulted and family have shared that they do not want a feeding tube. Family are aware her dementia and weight loss are terminal. Goals of care are still actively being delineated.  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  EEG normal.  MRI brain shows advanced atrophy.  I agree with Neurology that her condition was clearly irreversible at the time of first presentation and work up for autoimmune, paraneoplastic or prion diseases would not benefit Natalie Brown    Now probably Fast 7C or D dementia. Mother and sister aware of terminal condition. - Consult Palliative Care - Continue Haldol, reduced dose week of 7/30 and monitor symptoms - If no symptoms, can reduce off later in August     Severe protein calorie malnutrition Weight loss continues.  I suspect this will be her terminal condition soon. - Check BMP weekly for hyperNa and renal function - Continue nutrition supplements, MVI as much as able - Appreciate Palliative Care and dietitian consultations      Vitamin B12 Deficiency:   - Continue cyanocobalamin  Essential hypertension  BP normalized off medication   CKD Stage IIIa Cr stable   DVT prophylaxis:Lovenox Code Status: Full Family Communication: None Disposition Plan: Awaiting dispo Status is: Inpatient Remains inpatient appropriate because: Awaiting dispo.  Procedures:   None  Antimicrobials:  None  Subjective: No acute issues or events overnight  Objective: Vitals:   11/26/22 0614 11/26/22 1715 11/26/22 1958 11/27/22 0500  BP: 108/80 94/66 98/67    Pulse: (!) 114 (!) 108 87   Resp: 16  17   Temp: 98 F (36.7 C)     TempSrc: Oral     SpO2:  100% 100%   Weight:    46.7 kg  Height:        Intake/Output Summary (Last 24 hours) at 11/27/2022 0733 Last data filed at 11/26/2022 0951 Gross per 24 hour  Intake 60 ml  Output --  Net 60 ml   Filed Weights   11/13/22 0500 11/17/22 0554 11/27/22 0500  Weight: 48.5 kg 46.3 kg 46.7 kg    Examination:  General exam: Appears calm and comfortable, unresponsive to questioning Respiratory system: Clear to auscultation. Respiratory effort normal. Cardiovascular system: S1 & S2 heard, RRR.  Gastrointestinal system: Abdomen is soft Central nervous system: Alert and awake Extremities: No edema Skin: No significant lesions noted Psychiatry: Flat affect.    Data Reviewed: I have personally reviewed following labs and imaging studies  CBC: No results for input(s): "WBC", "NEUTROABS", "HGB", "HCT", "MCV", "PLT" in the last 168 hours. Basic Metabolic Panel: No results for input(s): "NA", "K", "CL", "CO2", "GLUCOSE", "BUN", "CREATININE", "CALCIUM", "MG", "PHOS" in the last 168 hours. GFR: Estimated Creatinine Clearance: 46.9 mL/min (by C-G formula based on SCr of 1 mg/dL). Liver Function Tests: No results for input(s): "AST", "ALT", "ALKPHOS", "BILITOT", "PROT", "ALBUMIN" in the last 168 hours. No results for input(s): "LIPASE", "AMYLASE" in the last 168 hours. No results for input(s): "AMMONIA" in the last 168 hours. Coagulation Profile: No results for input(s): "INR", "PROTIME" in the last 168 hours. Cardiac Enzymes: No results for input(s): "CKTOTAL", "CKMB", "CKMBINDEX", "TROPONINI" in the last 168 hours. BNP (last 3 results) No results for input(s): "PROBNP" in the last 8760 hours. HbA1C: No  results for input(s): "HGBA1C" in the last 72 hours. CBG: No results for input(s): "GLUCAP" in the last 168 hours. Lipid Profile: No results for input(s): "CHOL", "HDL", "LDLCALC", "TRIG", "CHOLHDL", "LDLDIRECT" in the last 72 hours. Thyroid Function Tests: No results for input(s): "TSH", "T4TOTAL", "FREET4", "T3FREE", "THYROIDAB" in the last 72 hours. Anemia Panel: No results for input(s): "VITAMINB12", "FOLATE", "FERRITIN", "TIBC", "IRON", "RETICCTPCT" in the last 72 hours. Sepsis Labs: No results for input(s): "PROCALCITON", "LATICACIDVEN" in the last 168 hours.  No results found for this or any previous visit (from the past 240 hour(s)).       Radiology Studies: No results found.      Scheduled Meds:  (feeding supplement) PROSource Plus  30 mL Oral BID BM   vitamin B-12  500 mcg Oral Daily   enoxaparin (LOVENOX) injection  40 mg Subcutaneous Daily   feeding supplement  237 mL Oral BID BM   haloperidol  1 mg Oral BID   hydrocerin   Topical BID   multivitamin with minerals  1 tablet Oral Daily   polyethylene glycol  17 g Oral Daily   senna-docusate  2 tablet Oral QHS   Tdap  0.5 mL Intramuscular Once     LOS: 336 days    Time spent: 35 minutes  Azucena Fallen, DO [facility]  If 7PM-7AM, please contact night-coverage [email or web address] 11/27/2022, 7:33 AM

## 2022-11-27 NOTE — Progress Notes (Signed)
Patient constantly getting out of bed, high fall risk patient, was difficult to redirect and after constant redirection within minutes patient is back in hallway with unsteady gait. For safety of patient, staff member had to sit temporarily with patient until patient began to rest. No sitter order currently active, perhaps that can be reconsidered since patient is high fall risk with multiple falls since admission.

## 2022-11-28 NOTE — Progress Notes (Signed)
PROGRESS NOTE    Natalie Brown  UJW:[id number] DOB: May 05, 1965 DOA: 12/24/2021 PCP: Natalie Sharps, NP   Brief Narrative:    Patient is a 57 year old African-American female, past medical history significant for bipolar disorder and documented early onset dementia.  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  Patient is now medically stabilized.   Patient was initially admitted to [facility] last May 2023 with agitation and aggressive behaviors.  This was treated initially as manic Bipolar, but after several months in [facility] it became clear that she had significant cognitive impairment, and dementia was suspected.  Patient was observed to have significant short term memory impairment and worsening hygiene, MOCA 11/30 and CT head revealed advanced cerebral atrophy.     In early September 2023, patient was transferred for further evaluation and treatment due to concerns of new metabolic encephalopathy (that appear to have developed acutely over ~1 day).  Neurology and Psychiatry were consulted.  EEG was unremarkable.  Ammonia, TSH and RPR were normal.  MRI brain revealed no acute findings, but confirmed advanced atrophy.  Neurology suspected a particularly aggressive premature dementia.     The patient has had progressive improvement.  While early notes implied that patient was forgetful but redirectable and oriented, she is now abulic and essentially nonverbal (nursing note occasional staff or some family members can elicit more responses, but they are very limited).  Patient has progressed beyond the ability to bathe or toilet independently.  Patient holds food in the mouth, and on recent calorie count (7/26-7/29) was observed to eat 50% of meals or less.  Nursing report she does not swallow her pills regularly due to pocketing of pills or holding pills in the mouth for long periods.  In the last 6months, she has had 22 kg weight loss and has become noticeably weaker and more prone to  falls when attempting to walk (which she cannot do safely without assistance).   Palliative have been consulted and family have shared that they do not want a feeding tube. Family are aware her dementia and weight loss are terminal. Goals of care are still actively being delineated.  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  EEG normal.  MRI brain shows advanced atrophy.  I agree with Neurology that her condition was clearly irreversible at the time of first presentation and work up for autoimmune, paraneoplastic or prion diseases would not benefit Natalie Brown    Now probably Fast 7C or D dementia. Mother and sister aware of terminal condition. - Consult Palliative Care - Continue Haldol, reduced dose week of 7/30 and monitor symptoms - If no symptoms, can reduce off later in August     Severe protein calorie malnutrition Weight loss continues.  I suspect this will be her terminal condition soon. - Check BMP weekly for hyperNa and renal function - Continue nutrition supplements, MVI as much as able - Appreciate Palliative Care and dietitian consultations      Vitamin B12 Deficiency:   - Continue cyanocobalamin  Essential hypertension  BP normalized off medication   CKD Stage IIIa Cr stable   DVT prophylaxis:Lovenox Code Status: Full Family Communication: None Disposition Plan: Awaiting dispo Status is: Inpatient Remains inpatient appropriate because: Awaiting dispo.  Procedures:   None  Antimicrobials:  None  Subjective: No acute issues or events overnight  Objective: Vitals:   11/27/22 0500 11/27/22 1727 11/27/22 1958 11/28/22 0451  BP:  101/83 116/67 102/60  Pulse:  (!) 106 74 66  Resp:  17 17 18   Temp:  98.4 F (36.9 C) (!) 97.3 F (36.3 C) 98.3 F (36.8 C)  TempSrc:  Oral    SpO2:  100% 100% 100%  Weight: 46.7 kg     Height:       No intake or output data in the 24 hours ending 11/28/22 0707  Filed Weights   11/13/22 0500 11/17/22 0554 11/27/22 0500  Weight: 48.5 kg 46.3 kg 46.7 kg    Examination:  General exam: Appears calm and comfortable, unresponsive to questioning Respiratory system: Clear to auscultation. Respiratory effort normal. Cardiovascular system: S1 & S2 heard, RRR.  Gastrointestinal system: Abdomen is soft Central nervous system: Alert and awake Extremities: No edema Skin: No significant lesions noted Psychiatry: Flat affect.    Data Reviewed: I have personally reviewed following labs and imaging studies  CBC: No results for input(s): "WBC", "NEUTROABS", "HGB", "HCT", "MCV", "PLT" in the last 168 hours. Basic Metabolic Panel: No results for input(s): "NA", "K", "CL", "CO2", "GLUCOSE", "BUN", "CREATININE", "CALCIUM", "MG", "PHOS" in the last 168 hours. GFR: Estimated Creatinine Clearance: 46.9 mL/min (by C-G formula based on SCr of 1 mg/dL). Liver Function Tests: No results for input(s): "AST", "ALT", "ALKPHOS", "BILITOT", "PROT", "ALBUMIN" in the last 168 hours. No results for input(s): "LIPASE", "AMYLASE" in the last 168 hours. No results for input(s): "AMMONIA" in the last 168 hours. Coagulation Profile: No results for input(s): "INR", "PROTIME" in the last 168 hours. Cardiac Enzymes: No results for input(s): "CKTOTAL", "CKMB", "CKMBINDEX", "TROPONINI" in the last 168 hours. BNP (last 3 results) No results for input(s): "PROBNP" in the last 8760 hours. HbA1C: No results for input(s): "HGBA1C" in the last 72  hours. CBG: No results for input(s): "GLUCAP" in the last 168 hours. Lipid Profile: No results for input(s): "CHOL", "HDL", "LDLCALC", "TRIG", "CHOLHDL", "LDLDIRECT" in the last 72 hours. Thyroid Function Tests: No results for input(s): "TSH", "T4TOTAL", "FREET4", "T3FREE", "THYROIDAB" in the last 72 hours. Anemia Panel: No results for input(s): "VITAMINB12", "FOLATE", "FERRITIN", "TIBC", "IRON", "RETICCTPCT" in the last 72 hours. Sepsis Labs: No results for input(s): "PROCALCITON", "LATICACIDVEN" in the last 168 hours.  No results found for this or any previous visit (from the past 240 hour(s)).       Radiology Studies: No results found.      Scheduled Meds:  (feeding supplement) PROSource Plus  30 mL Oral BID BM   vitamin B-12  500 mcg Oral Daily   enoxaparin (LOVENOX) injection  40 mg Subcutaneous Daily   feeding supplement  237 mL Oral BID BM   haloperidol  1 mg Oral BID   hydrocerin   Topical BID   multivitamin with minerals  1 tablet Oral Daily   polyethylene glycol  17 g Oral Daily   senna-docusate  2 tablet Oral QHS   Tdap  0.5 mL Intramuscular Once     LOS: 337 days    Time spent: 35 minutes    Azucena Fallen, DO [facility]  If 7PM-7AM, please contact night-coverage  [email or web address] 11/28/2022, 7:07 AM

## 2022-11-28 NOTE — Plan of Care (Signed)

## 2022-11-28 NOTE — Progress Notes (Signed)
Nutrition Follow-up  DOCUMENTATION CODES:   Severe malnutrition in context of chronic illness  INTERVENTION:  Continue Ensure Plus High Protein po BID, each supplement provides 350 kcal and 20 grams of protein.  Continue Prosource Plus BID  Continue Dys 3 diet   Encourage po intake   NUTRITION DIAGNOSIS:   Severe Malnutrition related to chronic illness (advanced dementia) as evidenced by energy intake < 75% for > or equal to 1 month, percent weight loss. -ongoing   GOAL:   Patient will meet greater than or equal to 90% of their needs   MONITOR:   PO intake, Supplement acceptance  REASON FOR ASSESSMENT:   LOS    ASSESSMENT:   57 y.o. female admits related to AMS. PMH includes: Bipolar disorde rand HTN. Pt is currently receiving medical management related to early onset dementia with behavioral disturbances.  Visited patient at bedside who was sleeping. RD did not awaken. No family at bedside to provide information.   Patient's lunch was at bedside and not much had been consumed. RD spoke with RN who reports she sleeps, wakes up, eats some and then goes back to sleep.   Family does not wish for artificial nutrition   Labs: reviewed  Meds: reviewed   Wt: 7/8- 110#, current- 102.9#  Pt weight has been gradually decreasing   PO: 10-75% x last 8 documented meals   I/O's:  +310 ml    Diet Order:   Diet Order             DIET DYS 3 Room service appropriate? Yes; Fluid consistency: Thin  Diet effective now                   EDUCATION NEEDS:   Not appropriate for education at this time  Skin:  Skin Assessment: Reviewed RN Assessment  Last BM:  8/7 type 6  Height:   Ht Readings from Last 1 Encounters:  02/03/22 5\' 6"  (1.676 m)    Weight:   Wt Readings from Last 1 Encounters:  11/27/22 46.7 kg    Ideal Body Weight:     BMI:  Body mass index is 16.62 kg/m.  Estimated Nutritional Needs:   Kcal:  1400-1700 kcals  Protein:  70-85  gm  Fluid:  >/= 1.4 L   Leodis Rains, RDN, LDN  Clinical Nutrition

## 2022-11-29 MED ORDER — HALOPERIDOL 1 MG PO TABS
0.5000 mg | ORAL_TABLET | Freq: Two times a day (BID) | ORAL | Status: DC
  Administered 2022-11-29 – 2022-12-08 (×18): 0.5 mg via ORAL
  Filled 2022-11-29 (×18): qty 1

## 2022-11-29 MED ORDER — LORAZEPAM 1 MG PO TABS
3.0000 mg | ORAL_TABLET | Freq: Two times a day (BID) | ORAL | Status: AC
  Administered 2022-11-29 – 2022-12-01 (×5): 3 mg via ORAL
  Filled 2022-11-29 (×5): qty 3

## 2022-11-29 NOTE — Progress Notes (Signed)
PROGRESS NOTE    Natalie Brown  ZOX:[id number] DOB: Aug 23, 1965 DOA: 12/24/2021 PCP: Natalie Sharps, NP   Brief Narrative:    Patient is a 57 year old African-American female, past medical history significant for bipolar disorder and documented early onset dementia.  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  Patient is now medically stabilized.   Patient was initially admitted to [facility] last May 2023 with agitation and aggressive behaviors.  This was treated initially as manic Bipolar, but after several months in [facility] it became clear that she had significant cognitive impairment, and dementia was suspected.  Patient was observed to have significant short term memory impairment and worsening hygiene, MOCA 11/30 and CT head revealed advanced cerebral atrophy.     In early September 2023, patient was transferred for further evaluation and treatment due to concerns of new metabolic encephalopathy (that appear to have developed acutely over ~1 day).  Neurology and Psychiatry were consulted.  EEG was unremarkable.  Ammonia, TSH and RPR were normal.  MRI brain revealed no acute findings, but confirmed advanced atrophy.  Neurology suspected a particularly aggressive premature dementia.     The patient has had progressive improvement.  While early notes implied that patient was forgetful but redirectable and oriented, she is now abulic and essentially nonverbal (nursing note occasional staff or some family members can elicit more responses, but they are very limited).  Patient has progressed beyond the ability to bathe or toilet independently.  Patient holds food in the mouth, and on recent calorie count (7/26-7/29) was observed to eat 50% of meals or less.  Nursing report she does not swallow her pills regularly due to pocketing of pills or holding pills in the mouth for long periods.  In the last 6months, she has had 22 kg weight loss and has become noticeably weaker and more prone to  falls when attempting to walk (which she cannot do safely without assistance).  Palliative have been consulted and family have shared that they do not want a feeding tube. Family are aware her dementia and weight loss are terminal. Goals of care are still actively being delineated.  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  EEG normal.  MRI brain shows advanced atrophy.  I agree with Neurology that her condition was clearly irreversible at the time of first presentation and work up for autoimmune, paraneoplastic or prion diseases would not benefit [name]   - Trial of benzodiazepines x3 days, increase dose as tolerated.  Now probably Fast 7D-F depending on the day - Mother and sister aware of terminal condition. - Consult Palliative Care - Continue Haldol, reduced dose as possible -0.5mg  BID 8/9 - hope to discontinue in the next 1-2 weeks.   Severe protein calorie malnutrition Weight loss continues.  I suspect this will be her terminal condition soon. - Check BMP weekly for hyperNa and renal function - Continue nutrition supplements, MVI as much as able - Appreciate Palliative Care and dietitian consultations    Vitamin B12 Deficiency:   -  Continue cyanocobalamin   Essential hypertension  - BP normalized off medication   CKD Stage IIIa - Cr stable, following only as indicated at this point.   DVT prophylaxis:Lovenox Code Status: Full Family Communication: None Disposition Plan: Awaiting  dispo Status is: Inpatient Remains inpatient appropriate because: Awaiting dispo.  Procedures:  None  Antimicrobials:  None  Subjective: No acute issues or events overnight  Objective: Vitals:   11/27/22 1958 11/28/22 0451 11/28/22 2014 11/29/22 0458  BP: 116/67 102/60 (!) 95/59 (!) 84/68  Pulse: 74 66 78   Resp: 17 18 16 16   Temp: (!) 97.3 F (36.3 C) 98.3 F (36.8 C) 98.1 F (36.7 C) 98 F (36.7 C)  TempSrc:      SpO2: 100% 100% 100% 100%  Weight:      Height:       No intake or output data in the 24 hours ending 11/29/22 0711  Filed Weights   11/13/22 0500 11/17/22 0554 11/27/22 0500  Weight: 48.5 kg 46.3 kg 46.7 kg    Examination:  General exam: Appears calm and comfortable, unresponsive to questioning/tactile stimulation although does resist eyelid opening Respiratory system: Clear to auscultation. Respiratory effort normal. Cardiovascular system: S1 & S2 heard, RRR.  Gastrointestinal system: Abdomen is soft Central nervous system: Alert and awake, non responsive Extremities: No edema Skin: No significant lesions noted Psychiatry: Unable to assess - essentially catatonic.  Data Reviewed: I have personally reviewed following labs and imaging studies  Scheduled Meds:  (feeding supplement) PROSource Plus  30 mL Oral BID BM   vitamin B-12  500 mcg Oral Daily   enoxaparin (LOVENOX) injection  40 mg Subcutaneous Daily   feeding supplement  237 mL Oral BID BM   haloperidol  1 mg Oral BID   hydrocerin   Topical BID   multivitamin with minerals  1 tablet Oral Daily   polyethylene glycol  17 g Oral Daily   senna-docusate  2 tablet Oral QHS   Tdap  0.5 mL Intramuscular Once    LOS: 338 days   Time spent: 35 minutes  Natalie Fallen, DO [facility]  If 7PM-7AM, please contact night-coverage [email or web address] 11/29/2022, 7:11 AM

## 2022-11-30 NOTE — Progress Notes (Signed)
Patient seen climbing out of bed, attempting to tuck her blanket into the wall folder. Patient climbing up onto the chair to stand. Not following verbal redirection. Patient carried off of chair for safety. Bed alarm on and patient set up for breakfast. No sitter ordered per nursing administration.

## 2022-11-30 NOTE — Progress Notes (Signed)
PROGRESS NOTE    Natalie Brown  ZOX:[id number] DOB: 05/31/1965 DOA: 12/24/2021 PCP: Lavinia Sharps, NP   Brief Narrative:    Patient is a 57 year old African-American female, past medical history significant for bipolar disorder and documented early onset dementia.  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  Patient is now medically stabilized.   Patient was initially admitted to [facility] last May 2023 with agitation and aggressive behaviors.  This was treated initially as manic Bipolar, but after several months in [facility] it became clear that she had significant cognitive impairment, and dementia was suspected.  Patient was observed to have significant short term memory impairment and worsening hygiene, MOCA 11/30 and CT head revealed advanced cerebral atrophy.     In early September 2023, patient was transferred for further evaluation and treatment due to concerns of new metabolic encephalopathy (that appear to have developed acutely over ~1 day).  Neurology and Psychiatry were consulted.  EEG was unremarkable.  Ammonia, TSH and RPR were normal.  MRI brain revealed no acute findings, but confirmed advanced atrophy.  Neurology suspected a particularly aggressive premature dementia.     The patient has had progressive improvement.  While early notes implied that patient was forgetful but redirectable and oriented, she is now abulic and essentially nonverbal (nursing note occasional staff or some family members can elicit more responses, but they are very limited).  Patient has progressed beyond the ability to bathe or toilet independently.  Patient holds food in the mouth, and on recent calorie count (7/26-7/29) was observed to eat 50% of meals or less.  Nursing report she does not swallow her pills regularly due to pocketing of pills or holding pills in the mouth for long periods.  In the last 6months, she has had 22 kg weight loss and has become noticeably weaker and more prone to  falls when attempting to walk (which she cannot do safely without assistance).  Palliative have been consulted and family have shared that they do not want a feeding tube. Family are aware her dementia and weight loss are terminal. Goals of care are still actively being delineated.  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  EEG normal.  MRI brain shows advanced atrophy.  I agree with Neurology that her condition was clearly irreversible at the time of first presentation and work up for autoimmune, paraneoplastic or prion diseases would not benefit Lady   - Trial of benzodiazepines x3 days, increase dose as tolerated.  Now probably Fast 7D-F depending on the day - Mother and sister aware of terminal condition. - Palliative Care previously consulted - Continue Haldol, reduced dose as possible -0.5mg  BID 8/9 - hope to discontinue in the next 1-2 weeks.   Severe protein calorie malnutrition Weight loss continues.  I suspect this will be her terminal condition soon. - Check BMP weekly for hyperNa and renal function - Continue nutrition supplements, MVI as much as able - Appreciate Palliative Care and dietitian consultations    Vitamin B12 Deficiency:   -  Continue cyanocobalamin   Essential hypertension  - BP normalized off medication   CKD Stage IIIa - Cr stable, following only as indicated at this point.   DVT prophylaxis:Lovenox Code Status: Full Family Communication: None Disposition Plan:  Awaiting dispo Status is: Inpatient Remains inpatient appropriate because: Awaiting dispo.  Procedures:  None  Antimicrobials:  None  Subjective: No acute issues or events overnight  Objective: Vitals:   11/27/22 1958 11/28/22 0451 11/28/22 2014 11/29/22 0458  BP: 116/67 102/60 (!) 95/59 (!) 84/68  Pulse: 74 66 78   Resp: 17 18 16 16   Temp: (!) 97.3 F (36.3 C) 98.3 F (36.8 C) 98.1 F (36.7 C) 98 F (36.7 C)  TempSrc:      SpO2: 100% 100% 100% 100%  Weight:      Height:        Intake/Output Summary (Last 24 hours) at 11/30/2022 0815 Last data filed at 11/29/2022 0900 Gross per 24 hour  Intake 100 ml  Output --  Net 100 ml    Filed Weights   11/13/22 0500 11/17/22 0554 11/27/22 0500  Weight: 48.5 kg 46.3 kg 46.7 kg    Examination:  General exam: Appears calm and comfortable, unresponsive to questioning/tactile stimulation although does resist eyelid opening Respiratory system: Clear to auscultation. Respiratory effort normal. Cardiovascular system: S1 & S2 heard, RRR.  Gastrointestinal system: Abdomen is soft Central nervous system: Alert and awake, non responsive Extremities: No edema Skin: No significant lesions noted Psychiatry: Unable to assess - essentially catatonic.  Data Reviewed: I have personally reviewed following labs and imaging studies  Scheduled Meds:  (feeding supplement) PROSource Plus  30 mL Oral BID BM   vitamin B-12  500 mcg Oral Daily   enoxaparin (LOVENOX) injection  40 mg Subcutaneous Daily   feeding supplement  237 mL Oral BID BM   haloperidol  0.5 mg Oral BID   hydrocerin   Topical BID   LORazepam  3 mg Oral BID   multivitamin with minerals  1 tablet Oral Daily   polyethylene glycol  17 g Oral Daily   senna-docusate  2 tablet Oral QHS   Tdap  0.5 mL Intramuscular Once    LOS: 339 days   Time spent: 35 minutes  Azucena Fallen, DO [facility]  If 7PM-7AM, please contact  night-coverage [email or web address] 11/30/2022, 8:15 AM

## 2022-11-30 NOTE — Progress Notes (Signed)
Patient has been setting off bed alarm all morning. Climbing on chairs, standing on her bed. Staff has rotated being in the room with her. Patient sleeping and staff was able to leave the room to tend to other patients. Bed alarm set.  Bed alarm rang out, staff ran to room to find patient laying on her stomach on the floor. Small laceration to her upper lip. Dr. Natale Milch contacted and made aware. Family contacted to make them aware.

## 2022-12-01 NOTE — Progress Notes (Signed)
PROGRESS NOTE    Natalie Brown  UJW:[id number] DOB: 1965-12-21 DOA: 12/24/2021 PCP: Lavinia Sharps, NP   Brief Narrative:    Patient is a 57 year old African-American female, past medical history significant for bipolar disorder and documented early onset dementia.  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  Patient is now medically stabilized.   Patient was initially admitted to [facility] last May 2023 with agitation and aggressive behaviors.  This was treated initially as manic Bipolar, but after several months in [facility] it became clear that she had significant cognitive impairment, and dementia was suspected.  Patient was observed to have significant short term memory impairment and worsening hygiene, MOCA 11/30 and CT head revealed advanced cerebral atrophy.     In early September 2023, patient was transferred for further evaluation and treatment due to concerns of new metabolic encephalopathy (that appear to have developed acutely over ~1 day).  Neurology and Psychiatry were consulted.  EEG was unremarkable.  Ammonia, TSH and RPR were normal.  MRI brain revealed no acute findings, but confirmed advanced atrophy.  Neurology suspected a particularly aggressive premature dementia.     The patient has had progressive improvement.  While early notes implied that patient was forgetful but redirectable and oriented, she is now abulic and essentially nonverbal (nursing note occasional staff or some family members can elicit more responses, but they are very limited).  Patient has progressed beyond the ability to bathe or toilet independently.  Patient holds food in the mouth, and on recent calorie count (7/26-7/29) was observed to eat 50% of meals or less.  Nursing report she does not swallow her pills regularly due to pocketing of pills or holding pills in the mouth for long periods.  In the last 6months, she has had 22 kg weight loss and has become noticeably weaker and more prone to  falls when attempting to walk (which she cannot do safely without assistance).  Palliative have been consulted and family have shared that they do not want a feeding tube. Family are aware her dementia and weight loss are terminal. Goals of care are still actively being delineated.  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  EEG normal.  MRI brain shows advanced atrophy.  I agree with Neurology that her condition was clearly irreversible at the time of first presentation and work up for autoimmune, paraneoplastic or prion diseases would not benefit [place]a   - Trial of benzodiazepines x3 days, increase dose as tolerated.  Now probably Fast 7D-F depending on the day - Mother and sister aware of terminal condition. - Palliative Care previously consulted - Continue Haldol, reduced dose as possible -0.5mg  BID 8/9 - hope to discontinue in the next 1-2 weeks.   Severe protein calorie malnutrition Weight loss continues.  I suspect this will be her terminal condition soon. - Check BMP weekly for hyperNa and renal function - Continue nutrition supplements, MVI as much as able - Appreciate Palliative Care and dietitian consultations    Vitamin B12 Deficiency:   -  Continue cyanocobalamin   Essential hypertension  - BP normalized off medication   CKD Stage IIIa - Cr stable, following only as indicated at this point.   DVT prophylaxis:Lovenox Code Status: Full Family Communication: None Disposition Plan:  Awaiting dispo Status is: Inpatient Remains inpatient appropriate because: Awaiting dispo.  Procedures:  None  Antimicrobials:  None  Subjective: No acute issues or events overnight  Objective: Vitals:   11/28/22 0451 11/28/22 2014 11/29/22 0458 12/01/22 0454  BP: 102/60 (!) 95/59 (!) 84/68 (!) 88/68  Pulse: 66 78  (!) 56  Resp: 18 16 16 18   Temp: 98.3 F (36.8 C) 98.1 F (36.7 C) 98 F (36.7 C) (!) 97.3 F (36.3 C)  TempSrc:    Oral  SpO2: 100% 100% 100% 100%  Weight:      Height:       No intake or output data in the 24 hours ending 12/01/22 0726   Filed Weights   11/13/22 0500 11/17/22 0554 11/27/22 0500  Weight: 48.5 kg 46.3 kg 46.7 kg    Examination:  General exam: Appears calm and comfortable, unresponsive to questioning/tactile stimulation although does resist eyelid opening Respiratory system: Clear to auscultation. Respiratory effort normal. Cardiovascular system: S1 & S2 heard, RRR.  Gastrointestinal system: Abdomen is soft Central nervous system: Alert and awake, non responsive Extremities: No edema Skin: No significant lesions noted Psychiatry: Unable to assess - essentially catatonic.  Data Reviewed: I have personally reviewed following labs and imaging studies  Scheduled Meds:  (feeding supplement) PROSource Plus  30 mL Oral BID BM   vitamin B-12  500 mcg Oral Daily   enoxaparin (LOVENOX) injection  40 mg Subcutaneous Daily   feeding supplement  237 mL Oral BID BM   haloperidol  0.5 mg Oral BID   hydrocerin   Topical BID   LORazepam  3 mg Oral BID   multivitamin with minerals  1 tablet Oral Daily   polyethylene glycol  17 g Oral Daily   senna-docusate  2 tablet Oral QHS   Tdap  0.5 mL Intramuscular Once    LOS: 340 days   Time spent: 35 minutes  Azucena Fallen, DO [facility]  If 7PM-7AM, please contact night-coverage [email or web address] 12/01/2022, 7:26 AM

## 2022-12-02 NOTE — Progress Notes (Signed)
PROGRESS NOTE    [name]  JXB:[id number] DOB: 06-23-1965 DOA: 12/24/2021 PCP: Lavinia Sharps, NP   Brief Narrative:    Patient is a 57 year old African-American female, past medical history significant for bipolar disorder and documented early onset dementia.  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  Patient is now medically stabilized.   Patient was initially admitted to [facility] last May 2023 with agitation and aggressive behaviors.  This was treated initially as manic Bipolar, but after several months in [facility] it became clear that she had significant cognitive impairment, and dementia was suspected.  Patient was observed to have significant short term memory impairment and worsening hygiene, MOCA 11/30 and CT head revealed advanced cerebral atrophy.     In early September 2023, patient was transferred for further evaluation and treatment due to concerns of new metabolic encephalopathy (that appear to have developed acutely over ~1 day).  Neurology and Psychiatry were consulted.  EEG was unremarkable.  Ammonia, TSH and RPR were normal.  MRI brain revealed no acute findings, but confirmed advanced atrophy.  Neurology suspected a particularly aggressive premature dementia.     The patient has had progressive improvement.  While early notes implied that patient was forgetful but redirectable and oriented, she is now abulic and essentially nonverbal (nursing note occasional staff or some family members can elicit more responses, but they are very limited).  Patient has progressed beyond the ability to bathe or toilet independently.  Patient holds food in the mouth, and on recent calorie count (7/26-7/29) was observed to eat 50% of meals or less.  Nursing report she does not swallow her pills regularly due to pocketing of pills or holding pills in the mouth for long periods.  In the last 6months, she has had 22 kg weight loss and has become noticeably weaker and more prone to  falls when attempting to walk (which she cannot do safely without assistance).  Palliative have been consulted and family have shared that they do not want a feeding tube. Family are aware her dementia and weight loss are terminal. Goals of care are still actively being delineated.  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  EEG normal.  MRI brain shows advanced atrophy.  I agree with Neurology that her condition was clearly irreversible at the time of first presentation and work up for autoimmune, paraneoplastic or prion diseases would not benefit [place]   Now probably Fast 7D-F depending on the day - Mother and sister aware of terminal condition. - Palliative Care previously consulted - Continue Haldol, reduced dose as possible -0.5mg  BID 8/9 - hope to discontinue in the next 1-2 weeks.   Severe protein calorie malnutrition Weight loss continues.  I suspect this will be her terminal condition soon. - Check BMP weekly for hyperNa and renal function - Continue nutrition supplements, MVI as much as able - Appreciate Palliative Care and dietitian consultations    Vitamin B12 Deficiency:   - Continue cyanocobalamin   Essential hypertension  - BP  normalized off medication   CKD Stage IIIa - Cr stable, following only as indicated at this point.   DVT prophylaxis:Lovenox Code Status: Full Family Communication: None Disposition Plan: Awaiting dispo Status is: Inpatient Remains inpatient appropriate  because: Awaiting dispo.  Procedures:  None  Antimicrobials:  None  Subjective: No acute issues or events overnight  Objective: Vitals:   11/29/22 0458 12/01/22 0454 12/01/22 1952 12/02/22 0641  BP: (!) 84/68 (!) 88/68 93/61 96/60   Pulse:  (!) 56 94 90  Resp: 16 18 18 16   Temp: 98 F (36.7 C) (!) 97.3 F (36.3 C) 98.2 F (36.8 C) 98.7 F (37.1 C)  TempSrc:  Oral Oral   SpO2: 100% 100% 100%   Weight:      Height:       No intake or output data in the 24 hours ending 12/02/22 0733   Filed Weights   11/13/22 0500 11/17/22 0554 11/27/22 0500  Weight: 48.5 kg 46.3 kg 46.7 kg    Examination:  General exam: Appears calm and comfortable, unresponsive to questioning/tactile stimulation although does resist eyelid opening Respiratory system: Clear to auscultation. Respiratory effort normal. Cardiovascular system: S1 & S2 heard, RRR.  Gastrointestinal system: Abdomen is soft Central nervous system: Alert and awake, non responsive Extremities: No edema Skin: No significant lesions noted Psychiatry: Unable to assess - essentially catatonic.  Data Reviewed: I have personally reviewed following labs and imaging studies  Scheduled Meds:  (feeding supplement) PROSource Plus  30 mL Oral BID BM   vitamin B-12  500 mcg Oral Daily   enoxaparin (LOVENOX) injection  40 mg Subcutaneous Daily   feeding supplement  237 mL Oral BID BM   haloperidol  0.5 mg Oral BID   hydrocerin   Topical BID   LORazepam  3 mg Oral BID   multivitamin with minerals  1 tablet Oral Daily   polyethylene glycol  17 g Oral Daily   senna-docusate  2 tablet Oral QHS   Tdap  0.5 mL Intramuscular Once    LOS: 341 days   Time spent: 35 minutes  Azucena Fallen, DO [facility]  If 7PM-7AM, please contact night-coverage [email or web address] 12/02/2022, 7:33 AM

## 2022-12-03 NOTE — Progress Notes (Signed)
PROGRESS NOTE    MELISSA GOHDE  JXB:[id number] DOB: Oct 12, 1965 DOA: 12/24/2021 PCP: Lavinia Sharps, NP   Brief Narrative:    Patient is a 57 year old African-American female, past medical history significant for bipolar disorder and documented early onset dementia.  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  Patient is now medically stabilized.   Patient was initially admitted to [facility] last May 2023 with agitation and aggressive behaviors.  This was treated initially as manic Bipolar, but after several months in [facility] it became clear that she had significant cognitive impairment, and dementia was suspected.  Patient was observed to have significant short term memory impairment and worsening hygiene, MOCA 11/30 and CT head revealed advanced cerebral atrophy.     In early September 2023, patient was transferred for further evaluation and treatment due to concerns of new metabolic encephalopathy (that appear to have developed acutely over ~1 day).  Neurology and Psychiatry were consulted.  EEG was unremarkable.  Ammonia, TSH and RPR were normal.  MRI brain revealed no acute findings, but confirmed advanced atrophy.  Neurology suspected a particularly aggressive premature dementia.     The patient has had progressive improvement.  While early notes implied that patient was forgetful but redirectable and oriented, she is now abulic and essentially nonverbal (nursing note occasional staff or some family members can elicit more responses, but they are very limited).  Patient has progressed beyond the ability to bathe or toilet independently.  Patient holds food in the mouth, and on recent calorie count (7/26-7/29) was observed to eat 50% of meals or less.  Nursing report she does not swallow her pills regularly due to pocketing of pills or holding pills in the mouth for long periods.  In the last 6months, she has had 22 kg weight loss and has become noticeably weaker and more prone to  falls when attempting to walk (which she cannot do safely without assistance).  Palliative have been consulted and family have shared that they do not want a feeding tube. Family are aware her dementia and weight loss are terminal. Goals of care are still actively being delineated - she remains total care and likely a complicated discharge.  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  EEG normal.  MRI brain shows advanced atrophy.  I agree with Neurology that her condition was clearly irreversible at the time of first presentation and work up for autoimmune, paraneoplastic or prion diseases would not benefit [place]   Now probably Fast 7D-F depending on the day - Mother and sister aware of terminal condition. - Palliative Care previously consulted - Continue Haldol, reduced dose as possible -0.5mg  BID 8/9 - hope to discontinue in the next 1-2 weeks.   Severe protein calorie malnutrition Weight loss continues.  I suspect this will be her terminal condition soon. - Check BMP weekly for hyperNa and renal function - Continue nutrition supplements, MVI as much as able - Appreciate Palliative Care and dietitian consultations    Vitamin B12 Deficiency:   -  Continue cyanocobalamin   Essential hypertension  - BP normalized off medication   CKD Stage IIIa - Cr stable, following only as indicated at this point.   DVT prophylaxis:Lovenox Code Status: Full Family Communication: None Disposition Plan: Awaiting  dispo Status is: Inpatient Remains inpatient appropriate because: Awaiting dispo.  Procedures:  None  Antimicrobials:  None  Subjective: No acute issues or events overnight  Objective: Vitals:   11/29/22 0458 12/01/22 0454 12/01/22 1952 12/02/22 0641  BP: (!) 84/68 (!) 88/68 93/61 96/60   Pulse:  (!) 56 94 90  Resp: 16 18 18 16   Temp: 98 F (36.7 C) (!) 97.3 F (36.3 C) 98.2 F (36.8 C) 98.7 F (37.1 C)  TempSrc:  Oral Oral   SpO2: 100% 100% 100%   Weight:      Height:       No intake or output data in the 24 hours ending 12/03/22 0626   Filed Weights   11/13/22 0500 11/17/22 0554 11/27/22 0500  Weight: 48.5 kg 46.3 kg 46.7 kg    Examination:  General exam: Appears calm and comfortable, unresponsive to questioning/tactile stimulation although does resist eyelid opening Respiratory system: Clear to auscultation. Respiratory effort normal. Cardiovascular system: S1 & S2 heard, RRR.  Gastrointestinal system: Abdomen is soft Central nervous system: Alert and awake, non responsive Extremities: No edema Skin: No significant lesions noted Psychiatry: Unable to assess - essentially catatonic.  Data Reviewed: I have personally reviewed following labs and imaging studies  Scheduled Meds:  (feeding supplement) PROSource Plus  30 mL Oral BID BM   vitamin B-12  500 mcg Oral Daily   enoxaparin (LOVENOX) injection  40 mg Subcutaneous Daily   feeding supplement  237 mL Oral BID BM   haloperidol  0.5 mg Oral BID   hydrocerin   Topical BID   multivitamin with minerals  1 tablet Oral Daily   polyethylene glycol  17 g Oral Daily   senna-docusate  2 tablet Oral QHS   Tdap  0.5 mL Intramuscular Once    LOS: 342 days   Time spent: 35 minutes  Azucena Fallen, DO [facility]  If 7PM-7AM, please contact night-coverage [email or web address] 12/03/2022, 6:26 AM

## 2022-12-03 NOTE — TOC Progression Note (Signed)
Transition of Care [facility]) - Progression Note    Patient Details  Name: Natalie Brown MRN: [id number] Date of Birth: [birth year]  Transition of Care [facility]) CM/SW Contact  Inis Sizer, LCSW Phone Number: 12/03/2022, 12:27 PM  Clinical Narrative:    CSW spoke with patient's mom [name] who states she has been unsuccessful in making the program change for patient's Medicaid.  CSW attempted to reach patient's Medicaid worker Everardo Beals however she is out of the office.   CSW spoke with Hettie Holstein at [facility] DSS who states she will attempt to contact [facility] directly to request patient be considered "exempt" and released from the tailored plan. [employer] [employer] will return call to CSW within 24 hours.     Barriers to Discharge: Financial Resources, Inadequate or no insurance, Homeless with medical needs  Expected Discharge Plan and Services In-house Referral: Clinical Social Work   Post Acute Care Choice: Nursing Home Living arrangements for the past 2 months: Homeless [facility]) Expected Discharge Date: 12/24/21                                     Social Determinants of Health (SDOH) Interventions SDOH Screenings   Food Insecurity: No Food Insecurity (03/05/2022)  Housing: High Risk (03/21/2022)  Alcohol Screen: Low Risk  (09/06/2021)  Depression (PHQ2-9): Medium Risk (05/26/2020)  Tobacco Use: High Risk (03/21/2022)    Readmission Risk Interventions     No data to display

## 2022-12-03 NOTE — Progress Notes (Signed)
Patient ID: JIMMY DUNNINGTON, female   DOB: September 29, 1965, 57 y.o.   MRN: [id number]    Progress Note from the Palliative Medicine Team at [facility]   Patient Name: ALEINA CONTANT        Date: 12/03/2022 DOB: 1965-05-08  Age: 57 y.o. MRN#: [id number] Attending Physician: Azucena Fallen, MD Primary Care Physician: Lavinia Sharps, NP Admit Date: 12/24/2021    Extensive chart review has been completed prior to meeting with patient/family  including labs, vital signs, imaging, progress/consult notes, orders, medications and available advance directive documents.   57 y.o. female   admitted on 12/24/2021 medically for 12/26/2021  1:58 AM for possible catatonia/altered mental status. She carries the psychiatric diagnoses of bipolar affective disorder with psychotic symptoms and has a past medical history of hypertension.  I met with this family initially on July 29,2024   At that time patient's family reported dense psychiatric history going back 20 years.   Patient initially was admitted to behavioral health where she was initially admitted with mania, dementia diagnosis was prescribed at that time.  IMPRESSION:   MRI 12/24/2021 1. No acute intracranial abnormality. 2. Advanced cerebral and cerebellar atrophy.  Today is day 344 of this hospital stay.    Patient's alertness/'s cognitive state continues to  wax and wane day-to-day.    Patient continues to fail to thrive.  Overall decreased  oral intake, per dietitian note she has lost 15% of her body weight in the last 3 months.  Patient does not have medical decision-making capacity. [name] basically not verbal and does not follow commands for me today   Mother at bedside.    Family face ongoing  treatment option decisions, advanced directive decisions and anticipatory care needs.   Ongoing education on the neurodegenerative disease of dementia and its natural trajectory. This is hard for patient's mother to hear and understand the  limitations of medical interventions to treat or prolong quality of life in a young patient with severe dementia.   A  discussion was had today regarding advanced directives.  Concepts specific to code status, artifical feeding and hydration, continued IV antibiotics and rehospitalization was had.          Educated family to consider DNR/DNI status understanding evidenced based poor outcomes in similar hospitalized patients, as the cause of arrest is likely associated with advanced chronic illness rather than an easily reversible acute cardio-pulmonary event.      Patient remains a full code.   Education offered today to mother regarding  the importance of continued conversation with her  family and the medical providers regarding overall plan of care and treatment options,  ensuring decisions are within the context of the patients values and GOCs.  Questions and concerns addressed   Discussed with Edwin Dada LCSW  Emotional support offered   Time: 50 minutes  Detailed review of medical records ( labs, imaging, vital signs), medically appropriate exam ( MS, skin, resp)   discussed with treatment team, counseling and education to patient, family, staff, documenting clinical information, medication management, coordination of care    Lorinda Creed NP  Palliative Medicine Team Team Phone # [phone] Pager [phone]

## 2022-12-04 DIAGNOSIS — E538 Deficiency of other specified B group vitamins: Secondary | ICD-10-CM

## 2022-12-04 NOTE — TOC Progression Note (Signed)
Transition of Care [facility]) - Progression Note    Patient Details  Name: Natalie Brown MRN: [id number] Date of Birth: 1966-02-14  Transition of Care [facility]) CM/SW Contact  Inis Sizer, LCSW Phone Number: 12/04/2022, 9:27 AM  Clinical Narrative:    CSW spoke with Hettie Holstein of [facility] DSS who states she will send CSW a form to complete that needs to be submitted to the state for a Medicaid program change. Once form is received, CSW will complete it and obtain necessary signatures and will submit it to [facility] for review.     Barriers to Discharge: Financial Resources, Inadequate or no insurance, Homeless with medical needs  Expected Discharge Plan and Services In-house Referral: Clinical Social Work   Post Acute Care Choice: Nursing Home Living arrangements for the past 2 months: Homeless [facility]) Expected Discharge Date: 12/24/21                                     Social Determinants of Health (SDOH) Interventions SDOH Screenings   Food Insecurity: No Food Insecurity (03/05/2022)  Housing: High Risk (03/21/2022)  Alcohol Screen: Low Risk  (09/06/2021)  Depression (PHQ2-9): Medium Risk (05/26/2020)  Tobacco Use: High Risk (03/21/2022)    Readmission Risk Interventions     No data to display

## 2022-12-04 NOTE — Plan of Care (Signed)
  Problem: Education: Goal: Knowledge of General Education information will improve Description Including pain rating scale, medication(s)/side effects and non-pharmacologic comfort measures Outcome: Progressing   

## 2022-12-04 NOTE — Progress Notes (Signed)
Nutrition Follow-up  DOCUMENTATION CODES:   Severe malnutrition in context of chronic illness  INTERVENTION:  - Continue Ensure Enlive po BID, each supplement provides 350 kcal and 20 grams of protein.  - Continue Prosource Plus q day.   NUTRITION DIAGNOSIS:   Severe Malnutrition related to chronic illness (advanced dementia) as evidenced by energy intake < 75% for > or equal to 1 month, percent weight loss. - Ongoing   GOAL:   Patient will meet greater than or equal to 90% of their needs - Not met.   MONITOR:   PO intake, Supplement acceptance  REASON FOR ASSESSMENT:   LOS    ASSESSMENT:   57 y.o. female admits related to AMS. PMH includes: Bipolar disorde rand HTN. Pt is currently receiving medical management related to early onset dementia with behavioral disturbances.  Meds reviewed: VB12, MVI, Miralax, senokot. Labs reviewed: WDL.   Pt ate 60% of her breakfast this am. Pt continues to not meet her nutritional needs. Palliative care continues to follow along. Family has decided to not pursue feeding tube at this time. RD will continue to monitor POC.   Diet Order:   Diet Order             DIET DYS 3 Room service appropriate? Yes; Fluid consistency: Thin  Diet effective now                   EDUCATION NEEDS:   Not appropriate for education at this time  Skin:  Skin Assessment: Reviewed RN Assessment  Last BM:  8/13 - type 6  Height:   Ht Readings from Last 1 Encounters:  02/03/22 5\' 6"  (1.676 m)    Weight:   Wt Readings from Last 1 Encounters:  12/03/22 43.6 kg    Ideal Body Weight:     BMI:  Body mass index is 15.51 kg/m.  Estimated Nutritional Needs:   Kcal:  1400-1700 kcals  Protein:  70-85 gm  Fluid:  >/= 1.4 L  Bethann Humble, RD, LDN, CNSC.

## 2022-12-04 NOTE — Progress Notes (Signed)
PROGRESS NOTE    JIMI TEETS  NWG:[id number] DOB: [birth year] DOA: 12/24/2021 PCP: Lavinia Sharps, NP   Brief Narrative:    Patient is a 57 year old African-American female, past medical history significant for bipolar disorder and documented early onset dementia.  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  Patient is now medically stabilized.   Patient was initially admitted to [facility] last May 2023 with agitation and aggressive behaviors.  This was treated initially as manic Bipolar, but after several months in [facility] it became clear that she had significant cognitive impairment, and dementia was suspected.  Patient was observed to have significant short term memory impairment and worsening hygiene, MOCA 11/30 and CT head revealed advanced cerebral atrophy.     In early September 2023, patient was transferred for further evaluation and treatment due to concerns of new metabolic encephalopathy (that appear to have developed acutely over ~1 day).  Neurology and Psychiatry were consulted.  EEG was unremarkable.  Ammonia, TSH and RPR were normal.  MRI brain revealed no acute findings, but confirmed advanced atrophy.  Neurology suspected a particularly aggressive premature dementia.     The patient has had progressive improvement.  While early notes implied that patient was forgetful but redirectable and oriented, she is now abulic and essentially nonverbal (nursing note occasional staff or some family members can elicit more responses, but they are very limited).  Patient has progressed beyond the ability to bathe or toilet independently.  Patient holds food in the mouth, and on recent calorie count (7/26-7/29) was observed to eat 50% of meals or less.  Nursing report she does not swallow her pills regularly due to pocketing of pills or holding pills in the mouth for long periods.  In the last 6months, she has had 22 kg weight loss and has become noticeably weaker and more prone to  falls when attempting to walk (which she cannot do safely without assistance).  Palliative have been consulted and family have shared that they do not want a feeding tube. Family are aware her dementia and weight loss are terminal. Goals of care are still actively being delineated - she remains total care and likely a complicated discharge.  8/15: No change, TOC is trying to help with Medicaid change.  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  EEG normal.  MRI brain shows advanced atrophy.  I agree with Neurology that her condition was clearly irreversible at the time of first presentation and work up for autoimmune, paraneoplastic or prion diseases would not benefit [place]   Now probably Fast 7D-F depending on the day - Mother and sister aware of terminal condition. - Palliative Care previously consulted - Continue Haldol, reduced dose as possible -0.5mg  BID 8/9 - hope to discontinue in the next 1-2 weeks.   Severe protein calorie malnutrition Weight loss continues.  I suspect this will be her terminal condition soon. - Check BMP weekly for hyperNa and renal function - Continue nutrition supplements, MVI as much as able - Appreciate Palliative  Care and dietitian consultations    Vitamin B12 Deficiency:   - Continue cyanocobalamin   Essential hypertension  - BP normalized off medication   CKD Stage IIIa - Cr stable, following only as indicated at this point.   DVT prophylaxis:Lovenox Code Status:  Full Family Communication: None Disposition Plan: Awaiting dispo Status is: Inpatient Remains inpatient appropriate because: Awaiting dispo.  Procedures:  None  Antimicrobials:  None  Subjective: No acute concern overnight.  Patient remained mostly unresponsive, only responding to painful stimuli and does not open eyes.  Objective: Vitals:   12/02/22 0641 12/03/22 0812 12/03/22 0833 12/04/22 0514  BP: 96/60  (!) 150/76 110/78  Pulse: 90  (!) 124 72  Resp: 16  19 18   Temp: 98.7 F (37.1 C)  99 F (37.2 C) 98.6 F (37 C)  TempSrc:      SpO2:   (!) 88% 100%  Weight:  43.6 kg    Height:        Intake/Output Summary (Last 24 hours) at 12/04/2022 1014 Last data filed at 12/04/2022 1000 Gross per 24 hour  Intake 350 ml  Output --  Net 350 ml     Filed Weights   11/17/22 0554 11/27/22 0500 12/03/22 0812  Weight: 46.3 kg 46.7 kg 43.6 kg    Examination:  General.  Catatonic lady, in no acute distress. Pulmonary.  Lungs clear bilaterally, normal respiratory effort. CV.  Regular rate and rhythm, no JVD, rub or murmur. Abdomen.  Soft, nontender, nondistended, BS positive. CNS.  Only withdraw with painful stimuli, no other response. Spontaneous body movements only Extremities.  No edema, no cyanosis, pulses intact and symmetrical. Psychiatry.  Catatonic  Data Reviewed: I have personally reviewed following labs and imaging studies  Scheduled Meds:  (feeding supplement) PROSource Plus  30 mL Oral BID BM   vitamin B-12  500 mcg Oral Daily   enoxaparin (LOVENOX) injection  40 mg Subcutaneous Daily   feeding supplement  237 mL Oral BID BM   haloperidol  0.5 mg Oral BID   hydrocerin   Topical BID   multivitamin with minerals  1 tablet Oral Daily   polyethylene glycol  17 g Oral Daily   senna-docusate  2 tablet Oral QHS   Tdap  0.5 mL Intramuscular Once    LOS: 343 days   Time spent: 40 minutes  Arnetha Courser, MD [facility]  If 7PM-7AM, please contact  night-coverage [email or web address] 12/04/2022, 10:14 AM

## 2022-12-04 NOTE — Plan of Care (Signed)

## 2022-12-05 NOTE — TOC Progression Note (Signed)
Transition of Care [facility]) - Progression Note    Patient Details  Name: RITU PADBERG MRN: [id number] Date of Birth: [birth year]  Transition of [facility] CM/SW Contact  Inis Sizer, LCSW Phone Number: 12/05/2022, 10:46 AM  Clinical Narrative:    There are no discharge planning updates available at this time.    Barriers to Discharge: Financial Resources, Inadequate or no insurance, Homeless with medical needs  Expected Discharge Plan and Services In-house Referral: Clinical Social Work   Post Acute Care Choice: Nursing Home Living arrangements for the past 2 months: Homeless [facility]) Expected Discharge Date: 12/24/21                                     Social Determinants of Health (SDOH) Interventions SDOH Screenings   Food Insecurity: No Food Insecurity (03/05/2022)  Housing: High Risk (03/21/2022)  Alcohol Screen: Low Risk  (09/06/2021)  Depression (PHQ2-9): Medium Risk (05/26/2020)  Tobacco Use: High Risk (03/21/2022)    Readmission Risk Interventions     No data to display

## 2022-12-05 NOTE — Progress Notes (Signed)
PROGRESS NOTE    Natalie Brown  ZOX:[id number] DOB: 02/13/1966 DOA: 12/24/2021 PCP: Lavinia Sharps, NP   Brief Narrative:    Patient is a 57 year old African-American female, past medical history significant for bipolar disorder and documented early onset dementia.  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  Patient is now medically stabilized.   Patient was initially admitted to [facility] last May 2023 with agitation and aggressive behaviors.  This was treated initially as manic Bipolar, but after several months in [facility] it became clear that she had significant cognitive impairment, and dementia was suspected.  Patient was observed to have significant short term memory impairment and worsening hygiene, MOCA 11/30 and CT head revealed advanced cerebral atrophy.     In early September 2023, patient was transferred for further evaluation and treatment due to concerns of new metabolic encephalopathy (that appear to have developed acutely over ~1 day).  Neurology and Psychiatry were consulted.  EEG was unremarkable.  Ammonia, TSH and RPR were normal.  MRI brain revealed no acute findings, but confirmed advanced atrophy.  Neurology suspected a particularly aggressive premature dementia.     The patient has had progressive improvement.  While early notes implied that patient was forgetful but redirectable and oriented, she is now abulic and essentially nonverbal (nursing note occasional staff or some family members can elicit more responses, but they are very limited).  Patient has progressed beyond the ability to bathe or toilet independently.  Patient holds food in the mouth, and on recent calorie count (7/26-7/29) was observed to eat 50% of meals or less.  Nursing report she does not swallow her pills regularly due to pocketing of pills or holding pills in the mouth for long periods.  In the last 6months, she has had 22 kg weight loss and has become noticeably weaker and more prone to  falls when attempting to walk (which she cannot do safely without assistance).  Palliative have been consulted and family have shared that they do not want a feeding tube. Family are aware her dementia and weight loss are terminal. Goals of care are still actively being delineated - she remains total care and likely a complicated discharge.  8/15: No change, TOC is trying to help with Medicaid change.  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  EEG normal.  MRI brain shows advanced atrophy.  I agree with Neurology that her condition was clearly irreversible at the time of first presentation and work up for autoimmune, paraneoplastic or prion diseases would not benefit [name]   Now probably Fast 7D-F depending on the day - Mother and sister aware of terminal condition. - Palliative Care previously consulted - Continue Haldol, reduced dose as possible -0.5mg  BID 8/9 - hope to discontinue in the next 1-2 weeks.   Severe protein calorie malnutrition Weight loss continues.  I suspect this will be her terminal condition soon. - Check BMP weekly for hyperNa and renal function - Continue nutrition supplements, MVI as much as able - Appreciate Palliative  Care and dietitian consultations    Vitamin B12 Deficiency:   - Continue cyanocobalamin   Essential hypertension  - BP normalized off medication   CKD Stage IIIa - Cr stable, following only as indicated at this point.   DVT prophylaxis:Lovenox Code Status:  Full Family Communication: None Disposition Plan: Awaiting dispo Status is: Inpatient Remains inpatient appropriate because: Awaiting dispo.  Procedures:  None  Antimicrobials:  None  Subjective: Patient was little more alert and trying to get out of bed when seen today.  Still not making any eye contact or following commands  Objective: Vitals:   12/03/22 0812 12/03/22 0833 12/04/22 0514 12/05/22 0438  BP:  (!) 150/76 110/78 (!) 123/108  Pulse:  (!) 124 72 98  Resp:  19 18 16   Temp:  99 F (37.2 C) 98.6 F (37 C) 98.1 F (36.7 C)  TempSrc:    Oral  SpO2:  (!) 88% 100% 99%  Weight: 43.6 kg     Height:       No intake or output data in the 24 hours ending 12/05/22 1442    Filed Weights   11/17/22 0554 11/27/22 0500 12/03/22 0812  Weight: 46.3 kg 46.7 kg 43.6 kg    Examination:  General.  Catatonic lady, in no acute distress. Pulmonary.  Lungs clear bilaterally, normal respiratory effort. CV.  Regular rate and rhythm, no JVD, rub or murmur. Abdomen.  Soft, nontender, nondistended, BS positive. CNS.  Awake but not making any eye contact or following any command Extremities.  No edema, no cyanosis, pulses intact and symmetrical.  Data Reviewed: I have personally reviewed following labs and imaging studies  Scheduled Meds:  (feeding supplement) PROSource Plus  30 mL Oral BID BM   vitamin B-12  500 mcg Oral Daily   enoxaparin (LOVENOX) injection  40 mg Subcutaneous Daily   feeding supplement  237 mL Oral BID BM   haloperidol  0.5 mg Oral BID   hydrocerin   Topical BID   multivitamin with minerals  1 tablet Oral Daily   polyethylene glycol  17 g Oral Daily   senna-docusate  2 tablet Oral QHS   Tdap  0.5 mL Intramuscular Once    LOS: 344 days   Time spent: 35 minutes  Arnetha Courser, MD [facility]  If 7PM-7AM, please contact night-coverage [email or web address] 12/05/2022, 2:42 PM

## 2022-12-06 NOTE — Progress Notes (Signed)
Progress Note   Patient: Natalie Brown:[id number] DOB: 1966/02/16 DOA: 12/24/2021     345 DOS: the patient was seen and examined on 12/06/2022   Brief hospital course: Patient is a 57 year old African-American female, past medical history significant for bipolar disorder and documented early onset dementia.  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  Patient is now medically stabilized.   Patient was initially admitted to [facility] last May 2023 with agitation and aggressive behaviors.  This was treated initially as manic Bipolar, but after several months in [facility] it became clear that she had significant cognitive impairment, and dementia was suspected.  Patient was observed to have significant short term memory impairment and worsening hygiene, MOCA 11/30 and CT head revealed advanced cerebral atrophy.     In early September 2023, patient was transferred for further evaluation and treatment due to concerns of new metabolic encephalopathy (that appear to have developed acutely over ~1 day).  Neurology and Psychiatry were consulted.  EEG was unremarkable.  Ammonia, TSH and RPR were normal.  MRI brain revealed no acute findings, but confirmed advanced atrophy.  Neurology suspected a particularly aggressive premature dementia.     While early notes implied that patient was forgetful but redirectable and oriented, she is now abulic and essentially nonverbal (nursing note occasional staff or some family members can elicit more responses, but they are very limited).  Patient has progressed beyond the ability to bathe or toilet independently.  Patient holds food in the mouth, and on recent calorie count (7/26-7/29) was observed to eat 50% of meals or less.  Nursing report she does not swallow her pills regularly due to pocketing of pills or holding pills in the mouth for long periods.  In the last 6months, she has had 22 kg weight loss and has become noticeably weaker and more prone to falls  when attempting to walk (which she cannot do safely without assistance).   Palliative have been consulted and family have shared that they do not want a feeding tube. Family are aware her dementia and weight loss are terminal. Goals of care are still actively being delineated - she remains total care and likely a complicated discharge.   8/15: No change, TOC is trying to help with Medicaid change. The patient has been deemed medically stable and is awaiting a disposition. The goal is to place her within the memory care unit but this is proving difficult as she is homeless and has no insurance. Per TOC patient's mother plans to go to DSS to apply for special assistance Medicaid.   Significant events: 09/07/2021: admitted to behavioral health treated with Zyprexa Invega and Ativan challenge for catatonia. 12/24/2021: sent to ER from [facility] for evaluation for tachycardia 9/04-9/5, 9/5>9/9, LTM EEG  no seizure. TSH stable CTA chest no PE no pneumonia RPR nonreactive bilateral LE Doppler negative for DVT Echo with normal EF  9/23 febrile secondary to Covid 19 + no pneumonia. 9/25 repeat CT brain unremarkable. 06/24/2022  psychiatry consulted for increasing agitation  Assessment and Plan: Early onset dementia  Bipolar Disorder Ammonia, TSH, RPR normal.  EEG normal.  MRI brain showed advanced atrophy.  Per Neurology  her condition was clearly irreversible at the time of first presentation and work up for autoimmune, paraneoplastic or prion diseases would not benefit  - Continue Haldol, reduced dose as possible -0.5mg  BID 8/9 - hope to discontinue in the next 1-2 weeks.   Severe protein calorie malnutrition Weight loss continues.   -  Check BMP weekly for hyperNa and renal function - Continue nutrition supplements, MVI as much as able - Appreciate Palliative Care and dietitian consultations    Vitamin B12 Deficiency:   - Continue cyanocobalamin   Essential hypertension  - BP normalized off medication    CKD Stage II      Subjective:  Sleeping   Physical Exam: Vitals:   12/05/22 2104 12/05/22 2200 12/06/22 0506 12/06/22 0803  BP: 136/88  130/83 101/68  Pulse: (!) 123 98 (!) 122 70  Resp: 18  18 18   Temp: 97.6 F (36.4 C)  97.6 F (36.4 C)   TempSrc: Oral  Oral   SpO2: 100%  100% 100%  Weight:      Height:       Physical Exam Vitals reviewed.  Constitutional:      General: [name] not in acute distress.    Appearance: [name] not ill-appearing or toxic-appearing.  Cardiovascular:     Rate and Rhythm: Normal rate and regular rhythm.     Heart sounds: No murmur heard. Pulmonary:     Effort: Pulmonary effort is normal. No respiratory distress.     Breath sounds: No wheezing, rhonchi or rales.  Neurological:     Mental Status: [name] alert.  Psychiatric:        Mood and Affect: Mood normal.        Behavior: Behavior normal.    Data Reviewed: No new data  Family Communication: none  Disposition: Status is: Inpatient Remains inpatient appropriate because: unsafe discharge     Time spent: 20 minutes  Author: Brendia Sacks, MD 12/06/2022 4:03 PM  For on call review [email or web address].

## 2022-12-07 NOTE — Plan of Care (Signed)

## 2022-12-07 NOTE — Progress Notes (Signed)
Progress Note   Patient: Natalie Brown ZOX:[id number] DOB: [birth year] DOA: 12/24/2021     346 DOS: the patient was seen and examined on 12/07/2022   Brief hospital course: Patient is a 57 year old African-American female, past medical history significant for bipolar disorder and documented early onset dementia.  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  Patient is now medically stabilized.   Patient was initially admitted to [facility] last May 2023 with agitation and aggressive behaviors.  This was treated initially as manic Bipolar, but after several months in [facility] it became clear that she had significant cognitive impairment, and dementia was suspected.  Patient was observed to have significant short term memory impairment and worsening hygiene, MOCA 11/30 and CT head revealed advanced cerebral atrophy.     In early September 2023, patient was transferred for further evaluation and treatment due to concerns of new metabolic encephalopathy (that appear to have developed acutely over ~1 day).  Neurology and Psychiatry were consulted.  EEG was unremarkable.  Ammonia, TSH and RPR were normal.  MRI brain revealed no acute findings, but confirmed advanced atrophy.  Neurology suspected a particularly aggressive premature dementia.     While early notes implied that patient was forgetful but redirectable and oriented, she is now abulic and essentially nonverbal (nursing note occasional staff or some family members can elicit more responses, but they are very limited).  Patient has progressed beyond the ability to bathe or toilet independently.  Patient holds food in the mouth, and on recent calorie count (7/26-7/29) was observed to eat 50% of meals or less.  Nursing report she does not swallow her pills regularly due to pocketing of pills or holding pills in the mouth for long periods.  In the last 6months, she has had 22 kg weight loss and has become noticeably weaker and more prone to falls  when attempting to walk (which she cannot do safely without assistance).   Palliative have been consulted and family have shared that they do not want a feeding tube. Family are aware her dementia and weight loss are terminal. Goals of care are still actively being delineated - she remains total care and likely a complicated discharge.   8/15: No change, TOC is trying to help with Medicaid change. The patient has been deemed medically stable and is awaiting a disposition. The goal is to place her within the memory care unit but this is proving difficult as she is homeless and has no insurance. Per TOC patient's mother plans to go to DSS to apply for special assistance Medicaid.   Significant events: 09/07/2021: admitted to behavioral health treated with Zyprexa Invega and Ativan challenge for catatonia. 12/24/2021: sent to ER from [facility] for evaluation for tachycardia 9/04-9/5, 9/5>9/9, LTM EEG  no seizure. TSH stable CTA chest no PE no pneumonia RPR nonreactive bilateral LE Doppler negative for DVT Echo with normal EF  9/23 febrile secondary to Covid 19 + no pneumonia. 9/25 repeat CT brain unremarkable. 06/24/2022  psychiatry consulted for increasing agitation  Assessment and Plan: Early onset dementia  Bipolar Disorder Ammonia, TSH, RPR normal.  EEG normal.  MRI brain showed advanced atrophy.  Per Neurology her condition was clearly irreversible at the time of first presentation and work up for autoimmune, paraneoplastic or prion diseases would not benefit  Continue Haldol, reduced dose as possible -0.5mg  BID 8/9 - hope to discontinue in the next 1-2 weeks.   Severe protein calorie malnutrition Weight loss continues.   - Check BMP  weekly for hyperNa and renal function - Continue nutrition supplements, MVI as much as able - Appreciate Palliative Care and dietitian consultations    Vitamin B12 Deficiency:   - Continue cyanocobalamin   Essential hypertension  - BP normalized off medication    CKD Stage II      Subjective:  Does not speak  Physical Exam: Vitals:   12/06/22 0803 12/06/22 1955 12/07/22 0441 12/07/22 0738  BP: 101/68 99/74 107/71 104/71  Pulse: 70 65 (!) 101 91  Resp: 18 16 16 18   Temp:  97.7 F (36.5 C) (!) 97 F (36.1 C) 98 F (36.7 C)  TempSrc:  Axillary Axillary Oral  SpO2: 100% 100% 100% 100%  Weight:      Height:       Physical Exam Vitals reviewed.  Constitutional:      General: [name] not in acute distress.    Appearance: [name] not ill-appearing or toxic-appearing.  Neurological:     Mental Status: [name] alert.     Data Reviewed: No new data  Family Communication:   Disposition: Status is: Inpatient Remains inpatient appropriate because: needs safe discahrge     Time spent: 20 minutes  Author: Brendia Sacks, MD 12/07/2022 3:14 PM  For on call review [email or web address].

## 2022-12-08 MED ORDER — HALOPERIDOL 1 MG PO TABS
0.5000 mg | ORAL_TABLET | Freq: Every day | ORAL | Status: DC
  Administered 2022-12-09 – 2022-12-15 (×7): 0.5 mg via ORAL
  Filled 2022-12-08 (×7): qty 1

## 2022-12-08 NOTE — Plan of Care (Signed)

## 2022-12-08 NOTE — Progress Notes (Signed)
Progress Note   Patient: Natalie Brown:[id number] DOB: Mar 09, 1966 DOA: 12/24/2021     347 DOS: the patient was seen and examined on 12/08/2022   Brief hospital course: Patient is a 57 year old African-American female, past medical history significant for bipolar disorder and documented early onset dementia.  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  Patient is now medically stabilized.   Patient was initially admitted to [facility] last May 2023 with agitation and aggressive behaviors.  This was treated initially as manic Bipolar, but after several months in [facility] it became clear that she had significant cognitive impairment, and dementia was suspected.  Patient was observed to have significant short term memory impairment and worsening hygiene, MOCA 11/30 and CT head revealed advanced cerebral atrophy.     In early September 2023, patient was transferred for further evaluation and treatment due to concerns of new metabolic encephalopathy (that appear to have developed acutely over ~1 day).  Neurology and Psychiatry were consulted.  EEG was unremarkable.  Ammonia, TSH and RPR were normal.  MRI brain revealed no acute findings, but confirmed advanced atrophy.  Neurology suspected a particularly aggressive premature dementia.     While early notes implied that patient was forgetful but redirectable and oriented, she is now abulic and essentially nonverbal (nursing note occasional staff or some family members can elicit more responses, but they are very limited).  Patient has progressed beyond the ability to bathe or toilet independently.  Patient holds food in the mouth, and on recent calorie count (7/26-7/29) was observed to eat 50% of meals or less.  Nursing report she does not swallow her pills regularly due to pocketing of pills or holding pills in the mouth for long periods.  In the last 6months, she has had 22 kg weight loss and has become noticeably weaker and more prone to falls  when attempting to walk (which she cannot do safely without assistance).   Palliative have been consulted and family have shared that they do not want a feeding tube. Family are aware her dementia and weight loss are terminal. Goals of care are still actively being delineated - she remains total care and likely a complicated discharge.   8/15: No change, TOC is trying to help with Medicaid change. The patient has been deemed medically stable and is awaiting a disposition. The goal is to place her within the memory care unit but this is proving difficult as she is homeless and has no insurance. Per TOC patient's mother plans to go to DSS to apply for special assistance Medicaid.   Significant events: 09/07/2021: admitted to behavioral health treated with Zyprexa Invega and Ativan challenge for catatonia. 12/24/2021: sent to ER from [facility] for evaluation for tachycardia 9/04-9/5, 9/5>9/9, LTM EEG  no seizure. TSH stable CTA chest no PE no pneumonia RPR nonreactive bilateral LE Doppler negative for DVT Echo with normal EF  9/23 febrile secondary to Covid 19 + no pneumonia. 9/25 repeat CT brain unremarkable. 06/24/2022  psychiatry consulted for increasing agitation  Assessment and Plan: Early onset dementia  Bipolar Disorder Ammonia, TSH, RPR normal.  EEG normal.  MRI brain showed advanced atrophy.  Per Neurology her condition was clearly irreversible at the time of first presentation and work up for autoimmune, paraneoplastic or prion diseases would not benefit  Continue Haldol, reduced dose as possible -0.5mg  BID 8/9 - hope to discontinue in the next 1-2 weeks.   Severe protein calorie malnutrition Weight loss continues.   - Check BMP  weekly for hyperNa and renal function - Continue nutrition supplements, MVI as much as able - Appreciate Palliative Care and dietitian consultations    Vitamin B12 Deficiency:   - Continue cyanocobalamin   Essential hypertension  - BP normalized off medication    CKD Stage II      Subjective:  Does not speak  Physical Exam: Vitals:   12/07/22 0441 12/07/22 0738 12/07/22 1938 12/08/22 0434  BP: 107/71 104/71 (!) 141/80 139/86  Pulse: (!) 101 91 100 (!) 108  Resp: 16 18 15 17   Temp: (!) 97 F (36.1 C) 98 F (36.7 C) 98.3 F (36.8 C) 98.1 F (36.7 C)  TempSrc: Axillary Oral    SpO2: 100% 100% (!) 53%   Weight:      Height:       Physical Exam Vitals reviewed.  Constitutional:      General: [name] not in acute distress.    Appearance: [name] not toxic-appearing.     Comments: Feeding self lunch     Data Reviewed: No new data  Family Communication: none  Disposition: Status is: Inpatient Remains inpatient appropriate because: await safe discharge     Time spent: 20 minutes  Author: Brendia Sacks, MD 12/08/2022 1:44 PM  For on call review [email or web address].

## 2022-12-08 NOTE — Progress Notes (Signed)
  Bedside nurse reported that soft restraint has been expired.  Patient was on wrist belt and bilateral wrist mitten which has been renewed.  Per chart review patient has been admitted for early onset of dementia and she has worsening delirium in the setting bipolar disorder.   Tereasa Coop, MD [facility] 12/08/2022, 1:22 AM

## 2022-12-09 NOTE — Plan of Care (Signed)

## 2022-12-09 NOTE — Progress Notes (Signed)
Progress Note   Patient: Natalie Brown ZOX:[id number] DOB: 23-Dec-1965 DOA: 12/24/2021     348 DOS: the patient was seen and examined on 12/09/2022   Brief hospital course: Patient is a 57 year old African-American female, past medical history significant for bipolar disorder and documented early onset dementia.  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  Patient is now medically stabilized.   Patient was initially admitted to [facility] last May 2023 with agitation and aggressive behaviors.  This was treated initially as manic Bipolar, but after several months in [facility] it became clear that she had significant cognitive impairment, and dementia was suspected.  Patient was observed to have significant short term memory impairment and worsening hygiene, MOCA 11/30 and CT head revealed advanced cerebral atrophy.     In early September 2023, patient was transferred for further evaluation and treatment due to concerns of new metabolic encephalopathy (that appear to have developed acutely over ~1 day).  Neurology and Psychiatry were consulted.  EEG was unremarkable.  Ammonia, TSH and RPR were normal.  MRI brain revealed no acute findings, but confirmed advanced atrophy.  Neurology suspected a particularly aggressive premature dementia.     While early notes implied that patient was forgetful but redirectable and oriented, she is now abulic and essentially nonverbal (nursing note occasional staff or some family members can elicit more responses, but they are very limited).  Patient has progressed beyond the ability to bathe or toilet independently.  Patient holds food in the mouth, and on recent calorie count (7/26-7/29) was observed to eat 50% of meals or less.  Nursing report she does not swallow her pills regularly due to pocketing of pills or holding pills in the mouth for long periods.  In the last 6months, she has had 22 kg weight loss and has become noticeably weaker and more prone to falls  when attempting to walk (which she cannot do safely without assistance).   Palliative have been consulted and family have shared that they do not want a feeding tube. Family are aware her dementia and weight loss are terminal. Goals of care are still actively being delineated - she remains total care and likely a complicated discharge.   8/15: No change, TOC is trying to help with Medicaid change. The patient has been deemed medically stable and is awaiting a disposition. The goal is to place her within the memory care unit but this is proving difficult as she is homeless and has no insurance. Per TOC patient's mother plans to go to DSS to apply for special assistance Medicaid.   Significant events: 09/07/2021: admitted to behavioral health treated with Zyprexa Invega and Ativan challenge for catatonia. 12/24/2021: sent to ER from [facility] for evaluation for tachycardia 9/04-9/5, 9/5>9/9, LTM EEG  no seizure. TSH stable CTA chest no PE no pneumonia RPR nonreactive bilateral LE Doppler negative for DVT Echo with normal EF  9/23 febrile secondary to Covid 19 + no pneumonia. 9/25 repeat CT brain unremarkable. 06/24/2022  psychiatry consulted for increasing agitation  Assessment and Plan:  Early onset dementia  Bipolar Disorder Ammonia, TSH, RPR normal.  EEG normal.  MRI brain showed advanced atrophy.  Per Neurology her condition was clearly irreversible at the time of first presentation and work up for autoimmune, paraneoplastic or prion diseases would not benefit  Continue Haldol, reduced dose as possible -0.5mg  BID 8/9, reduced again 8/18   Severe protein calorie malnutrition Weight loss continues.   - Check BMP intermittently for hyperNa and renal  function - Continue nutrition supplements, MVI as much as able - Appreciate Palliative Care and dietitian consultations    Vitamin B12 Deficiency:   - Continue cyanocobalamin   Essential hypertension  - BP normalized off medication   CKD Stage  II     Subjective:  Does not speak  Physical Exam: Vitals:   12/07/22 1938 12/08/22 0434 12/08/22 1959 12/09/22 0444  BP: (!) 141/80 139/86 (!) 145/71 115/72  Pulse: 100 (!) 108 100 87  Resp: 15 17 17 18   Temp: 98.3 F (36.8 C) 98.1 F (36.7 C) (!) 97.5 F (36.4 C) 97.8 F (36.6 C)  TempSrc:   Oral Oral  SpO2: (!) 53%  100% 100%  Weight:      Height:       Physical Exam Vitals reviewed.  Constitutional:      General: [name] not in acute distress.    Appearance: [name] not ill-appearing or toxic-appearing.  Neurological:     Mental Status: [name] alert.  Psychiatric:        Mood and Affect: Mood normal.        Behavior: Behavior normal.     Data Reviewed: No new data  Family Communication: none  Disposition: Status is: Inpatient Remains inpatient appropriate because: await safe discharge     Time spent: 20 minutes  Author: Brendia Sacks, MD 12/09/2022 5:34 PM  For on call review [email or web address].

## 2022-12-09 NOTE — Plan of Care (Signed)
  Problem: Education: Goal: Knowledge of General Education information will improve Description: Including pain rating scale, medication(s)/side effects and non-pharmacologic comfort measures 12/09/2022 2303 by Ronna Polio, RN Outcome: Progressing 12/09/2022 2211 by Ronna Polio, RN Outcome: Progressing   Problem: Health Behavior/Discharge Planning: Goal: Ability to manage health-related needs will improve 12/09/2022 2303 by Ronna Polio, RN Outcome: Progressing 12/09/2022 2211 by Raynelle Dick D, RN Outcome: Progressing   Problem: Clinical Measurements: Goal: Ability to maintain clinical measurements within normal limits will improve 12/09/2022 2303 by Ronna Polio, RN Outcome: Progressing 12/09/2022 2211 by Raynelle Dick D, RN Outcome: Progressing Goal: Will remain free from infection 12/09/2022 2303 by Raynelle Dick D, RN Outcome: Progressing 12/09/2022 2211 by Raynelle Dick D, RN Outcome: Progressing Goal: Diagnostic test results will improve 12/09/2022 2303 by Ronna Polio, RN Outcome: Progressing 12/09/2022 2211 by Raynelle Dick D, RN Outcome: Progressing Goal: Respiratory complications will improve 12/09/2022 2303 by Raynelle Dick D, RN Outcome: Progressing 12/09/2022 2211 by Raynelle Dick D, RN Outcome: Progressing Goal: Cardiovascular complication will be avoided 12/09/2022 2303 by Raynelle Dick D, RN Outcome: Progressing 12/09/2022 2211 by Ronna Polio, RN Outcome: Progressing   Problem: Activity: Goal: Risk for activity intolerance will decrease 12/09/2022 2303 by Raynelle Dick D, RN Outcome: Progressing 12/09/2022 2211 by Raynelle Dick D, RN Outcome: Progressing   Problem: Nutrition: Goal: Adequate nutrition will be maintained 12/09/2022 2303 by Ronna Polio, RN Outcome: Progressing 12/09/2022 2211 by Raynelle Dick D, RN Outcome: Progressing   Problem: Coping: Goal: Level of anxiety will  decrease 12/09/2022 2303 by Ronna Polio, RN Outcome: Progressing 12/09/2022 2211 by Raynelle Dick D, RN Outcome: Progressing   Problem: Elimination: Goal: Will not experience complications related to bowel motility 12/09/2022 2303 by Ronna Polio, RN Outcome: Progressing 12/09/2022 2211 by Raynelle Dick D, RN Outcome: Progressing Goal: Will not experience complications related to urinary retention 12/09/2022 2303 by Ronna Polio, RN Outcome: Progressing 12/09/2022 2211 by Ronna Polio, RN Outcome: Progressing   Problem: Pain Managment: Goal: General experience of comfort will improve 12/09/2022 2303 by Ronna Polio, RN Outcome: Progressing 12/09/2022 2211 by Raynelle Dick D, RN Outcome: Progressing   Problem: Safety: Goal: Ability to remain free from injury will improve 12/09/2022 2303 by Ronna Polio, RN Outcome: Progressing 12/09/2022 2211 by Raynelle Dick D, RN Outcome: Progressing   Problem: Skin Integrity: Goal: Risk for impaired skin integrity will decrease 12/09/2022 2303 by Raynelle Dick D, RN Outcome: Progressing 12/09/2022 2211 by Ronna Polio, RN Outcome: Progressing

## 2022-12-10 NOTE — Plan of Care (Signed)

## 2022-12-10 NOTE — Progress Notes (Signed)
Progress Note    Natalie Brown   ZOX:[id number]  DOB: [birth year]  DOA: 12/24/2021     349 PCP: Lavinia Sharps, NP  Initial CC: AMS  Hospital Course: Patient is a 57 year old African-American female, past medical history significant for bipolar disorder and documented early onset dementia.  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  Patient is now medically stabilized.   Patient was initially admitted to [facility] last May 2023 with agitation and aggressive behaviors.  This was treated initially as manic Bipolar, but after several months in [facility] it became clear that she had significant cognitive impairment, and dementia was suspected.  Patient was observed to have significant short term memory impairment and worsening hygiene, MOCA 11/30 and CT head revealed advanced cerebral atrophy.     In early September 2023, patient was transferred for further evaluation and treatment due to concerns of new metabolic encephalopathy (that appear to have developed acutely over ~1 day).  Neurology and Psychiatry were consulted.  EEG was unremarkable.  Ammonia, TSH and RPR were normal.  MRI brain revealed no acute findings, but confirmed advanced atrophy.  Neurology suspected a particularly aggressive premature dementia.     While early notes implied that patient was forgetful but redirectable and oriented, she is now abulic and essentially nonverbal (nursing note occasional staff or some family members can elicit more responses, but they are very limited).  Patient has progressed beyond the ability to bathe or toilet independently.  Patient holds food in the mouth, and on recent calorie count (7/26-7/29) was observed to eat 50% of meals or less.  Nursing report she does not swallow her pills regularly due to pocketing of pills or holding pills in the mouth for long periods.  In the last 6months, she has had 22 kg weight loss and has become noticeably weaker and more prone to falls when attempting  to walk (which she cannot do safely without assistance).   Palliative have been consulted and family have shared that they do not want a feeding tube. Family are aware her dementia and weight loss are terminal. Goals of care are still actively being delineated - she remains total care and likely a complicated discharge.   8/15: No change, TOC is trying to help with Medicaid change. The patient has been deemed medically stable and is awaiting a disposition. The goal is to place her within the memory care unit but this is proving difficult as she is homeless and has no insurance. Per TOC patient's mother plans to go to DSS to apply for special assistance Medicaid.   Significant events: 09/07/2021: admitted to behavioral health treated with Zyprexa Invega and Ativan challenge for catatonia. 12/24/2021: sent to ER from [facility] for evaluation for tachycardia 9/04-9/5, 9/5>9/9, LTM EEG  no seizure. TSH stable CTA chest no PE no pneumonia RPR nonreactive bilateral LE Doppler negative for DVT Echo with normal EF  9/23 febrile secondary to Covid 19 + no pneumonia. 9/25 repeat CT brain unremarkable. 06/24/2022  psychiatry consulted for increasing agitation  Interval History:  I last saw patient in January 2024.  She has lost remarkable amount of weight and now appears to have almost a blank stare.  Unfortunately she remains full code and she is going to continue to progressively decline.  Goals of care discussions will need to be continued with family as she will inevitably decline with worsening nutritional support and further deteriorate.  Assessment and Plan:  Early onset dementia  Bipolar Disorder Ammonia, TSH,  RPR normal.  EEG normal.  MRI brain showed advanced atrophy.  Per Neurology her condition was clearly irreversible at the time of first presentation and work up for autoimmune, paraneoplastic or prion diseases would not benefit  Continue Haldol, reduced dose as possible -0.5mg  BID 8/9, reduced again  8/18   Severe protein calorie malnutrition - Weight loss continues.   - Check BMP intermittently for hyperNa and renal function - Continue nutrition supplements, MVI as much as able - Appreciate Palliative Care and dietitian consultations - Needs routine evals by palliative care for ongoing code status discussions with family; last seen by [facility] on 8/13, appreciate assistance    Vitamin B12 Deficiency:   - Continue cyanocobalamin   Essential hypertension  - BP normalized off medication   CKD Stage II  - creatinine stable and normal range at this time    Old records reviewed in assessment of this patient  DVT prophylaxis:  enoxaparin (LOVENOX) injection 40 mg Start: 11/07/22 1000   Code Status:   Code Status: Full Code  Mobility Assessment (Last 72 Hours)     Mobility Assessment     Row Name 12/09/22 2100 12/09/22 0918 12/08/22 2000 12/08/22 1035 12/08/22 0755   Does patient have an order for bedrest or is patient medically unstable No - Continue assessment No - Continue assessment No - Continue assessment -- No - Continue assessment   What is the highest level of mobility based on the progressive mobility assessment? -- Level 4 (Walks with assist in room) - Balance while marching in place and cannot step forward and back - Complete -- Level 1 (Bedfast) - Unable to balance while sitting on edge of bed Level 1 (Bedfast) - Unable to balance while sitting on edge of bed   Is the above level different from baseline mobility prior to current illness? Yes - Recommend PT order Yes - Recommend PT order Yes - Recommend PT order -- --    Row Name 12/07/22 19:38:55           Does patient have an order for bedrest or is patient medically unstable No - Continue assessment       What is the highest level of mobility based on the progressive mobility assessment? Level 1 (Bedfast) - Unable to balance while sitting on edge of bed       Is the above level different from baseline mobility prior  to current illness? Yes - Recommend PT order                Barriers to discharge: Placement issues Disposition Plan:  unknown; possibly inpatient death Status is: Inpt  Objective: Blood pressure 124/81, pulse 70, temperature 97.9 F (36.6 C), temperature source Oral, resp. rate 18, height 5\' 6"  (1.676 m), weight 43.6 kg, SpO2 100%.  Examination:  Physical Exam Constitutional:      Comments: Chronically ill appearing with blank stare on face; does not interact or react   HENT:     Head: Normocephalic and atraumatic.     Mouth/Throat:     Mouth: Mucous membranes are dry.  Eyes:     Extraocular Movements: Extraocular movements intact.  Cardiovascular:     Rate and Rhythm: Normal rate and regular rhythm.  Pulmonary:     Effort: Pulmonary effort is normal. No respiratory distress.     Breath sounds: Normal breath sounds.  Abdominal:     General: Bowel sounds are normal. There is no distension.     Palpations: Abdomen is  soft.     Tenderness: There is no abdominal tenderness.  Musculoskeletal:        General: No swelling.     Cervical back: Normal range of motion and neck supple.  Skin:    General: Skin is warm and dry.  Neurological:     Comments: Rigid UE and flaccid LE. Does not follow any commands      Consultants:  Palliative care  Data Reviewed: No results found for this or any previous visit (from the past 24 hour(s)).  I have reviewed pertinent nursing notes, vitals, labs, and images as necessary. I have ordered labwork to follow up on as indicated.  I have reviewed the last notes from staff over past 24 hours. I have discussed patient's care plan and test results with nursing staff, CM/SW, and other staff as appropriate.    LOS: 349 days   Lewie Chamber, MD [facility] 12/10/2022, 1:03 PM

## 2022-12-11 NOTE — Plan of Care (Signed)

## 2022-12-11 NOTE — Progress Notes (Signed)
PROGRESS NOTE   Natalie Brown  UJW:[id number]    DOB: 01-10-1966    DOA: 12/24/2021  PCP: Lavinia Sharps, NP   I have briefly reviewed patients previous medical records in [facility].    Brief Narrative:   Patient is a 57 year old African-American female, past medical history significant for bipolar disorder and documented early onset dementia.  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  Patient is now medically stabilized.   Patient was initially admitted to [facility] last May 2023 with agitation and aggressive behaviors.  This was treated initially as manic Bipolar, but after several months in [facility] it became clear that she had significant cognitive impairment, and dementia was suspected.  Patient was observed to have significant short term memory impairment and worsening hygiene, MOCA 11/30 and CT head revealed advanced cerebral atrophy.     In early September 2023, patient was transferred for further evaluation and treatment due to concerns of new metabolic encephalopathy (that appear to have developed acutely over ~1 day).  Neurology and Psychiatry were consulted.  EEG was unremarkable.  Ammonia, TSH and RPR were normal.  MRI brain revealed no acute findings, but confirmed advanced atrophy.  Neurology suspected a particularly aggressive premature dementia.     While early notes implied that patient was forgetful but redirectable and oriented, she is now abulic and essentially nonverbal (nursing note occasional staff or some family members can elicit more responses, but they are very limited).  Patient has progressed beyond the ability to bathe or toilet independently.  Patient holds food in the mouth, and on recent calorie count (7/26-7/29) was observed to eat 50% of meals or less.  Nursing report she does not swallow her pills regularly due to pocketing of pills or holding pills in the mouth for long periods.  In the last 6months, she has had 22 kg weight loss and has  become noticeably weaker and more prone to falls when attempting to walk (which she cannot do safely without assistance).   Palliative have been consulted and family have shared that they do not want a feeding tube. Family are aware her dementia and weight loss are terminal. Goals of care are still actively being delineated - she remains total care and likely a complicated discharge.   8/15: No change, TOC is trying to help with Medicaid change. The patient has been deemed medically stable and is awaiting a disposition. The goal is to place her within the memory care unit but this is proving difficult as she is homeless and has no insurance. Per TOC patient's mother plans to go to DSS to apply for special assistance Medicaid.   Significant events: 09/07/2021: admitted to behavioral health treated with Zyprexa Invega and Ativan challenge for catatonia. 12/24/2021: sent to ER from [facility] for evaluation for tachycardia 9/04-9/5, 9/5>9/9, LTM EEG  no seizure. TSH stable CTA chest no PE no pneumonia RPR nonreactive bilateral LE Doppler negative for DVT Echo with normal EF  9/23 febrile secondary to Covid 19 + no pneumonia. 9/25 repeat CT brain unremarkable. 06/24/2022  psychiatry consulted for increasing agitation   Assessment & Plan:  Principal Problem:   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) Active Problems:   Delirium due to multiple etiologies, acute, hyperactive   Bipolar affective disorder, current episode manic with psychotic symptoms (HCC)   Sinus tachycardia   Tobacco abuse   Altered mental state   B12 deficiency   COVID-19 virus infection   Drooling   Essential  hypertension   Protein-calorie malnutrition, severe   Assessment and Plan:   Early onset dementia  Bipolar Disorder Ammonia, TSH, RPR normal.  EEG normal.  MRI brain showed advanced atrophy.  Per Neurology her condition was clearly irreversible at the time of first presentation and work up for autoimmune,  paraneoplastic or prion diseases would not benefit  Continue Haldol, reduced dose as possible -0.5mg  BID 8/9, reduced again 8/18 to 0.5 mg daily. Remains nonverbal.  No agitation noted.  Has Posey belt.   Severe protein calorie malnutrition - Weight loss continues.   - Check BMP intermittently for hyperNa and renal function.  No labs since 7/27, ordered for tomorrow. - Continue nutrition supplements, MVI as much as able - Appreciate Palliative Care and dietitian consultations - Needs routine evals by palliative care for ongoing code status discussions with family; last seen by [facility] on 8/13, appreciate assistance  -Per nursing, patient usually fed by her mother who comes in daily.   Vitamin B12 Deficiency:   - Continue cyanocobalamin   Essential hypertension  - BP normalized off medication   CKD Stage II  - creatinine stable and normal range at this time   Body mass index is 15.51 kg/m.  Nutritional Status Nutrition Problem: Severe Malnutrition Etiology: chronic illness (advanced dementia) Signs/Symptoms: energy intake < 75% for > or equal to 1 month, percent weight loss Percent weight loss: 15 % (in 3 months) Interventions: Calorie Count, Ensure Enlive (each supplement provides 350kcal and 20 grams of protein), MVI    ACP Documents: Appreciated GOC note by palliative care team. DVT prophylaxis: enoxaparin (LOVENOX) injection 40 mg Start: 11/07/22 1000     Code Status: Full Code:  Family Communication: None at bedside Disposition:  Status is: Inpatient Remains inpatient appropriate because: Lack of safe disposition.     Consultants:   Palliative care medicine Psychiatry Neurology  Procedures:     Antimicrobials:      Subjective:  As per RN, patient's mother was here earlier today when fed the patient but not sure how much she truly ate.  Not sure if patient speaking any at all.  During my visit, had eyes closed, nonverbal.  Then on repeated call, briefly opened  eyes.  Does not follow instructions.  Objective:   Vitals:   12/10/22 2016 12/11/22 0509 12/11/22 0510 12/11/22 0813  BP: 102/75 (!) 122/59 (!) 122/59 116/88  Pulse: 64 68 96 96  Resp: 18 18 18 18   Temp: 97.6 F (36.4 C) 98.2 F (36.8 C) 98.2 F (36.8 C)   TempSrc: Oral Oral Oral   SpO2: 100% 94% 96% 100%  Weight:      Height:        General exam: Young female, small built, frail, cachectic, chronically ill looking lying comfortably propped up in bed without distress. Respiratory system: Clear to auscultation. Respiratory effort normal. Cardiovascular system: S1 & S2 heard, RRR. No JVD, murmurs, rubs, gallops or clicks. No pedal edema. Gastrointestinal system: Abdomen is nondistended, soft and nontender. No organomegaly or masses felt. Normal bowel sounds heard.  Has Posey belt across abdomen. Central nervous system: Mental status as noted above. No focal neurological deficits. Extremities: Not sure if patient has contractures in the lower extremities, flexed at bilateral knees.  Appears to be moving all limbs somewhat. Skin: Did not not assess Psychiatry: Judgement and insight impaired. Mood & affect flat and unable to assess    Data Reviewed:   I have personally reviewed following labs and imaging studies  CBC: No results for input(s): "WBC", "NEUTROABS", "HGB", "HCT", "MCV", "PLT" in the last 168 hours.  Basic Metabolic Panel: No results for input(s): "NA", "K", "CL", "CO2", "GLUCOSE", "BUN", "CREATININE", "CALCIUM", "MG", "PHOS" in the last 168 hours.  Liver Function Tests: No results for input(s): "AST", "ALT", "ALKPHOS", "BILITOT", "PROT", "ALBUMIN" in the last 168 hours.  CBG: No results for input(s): "GLUCAP" in the last 168 hours.  Microbiology Studies:  No results found for this or any previous visit (from the past 240 hour(s)).  Radiology Studies:  No results found.  Scheduled Meds:    (feeding supplement) PROSource Plus  30 mL Oral BID BM   vitamin  B-12  500 mcg Oral Daily   enoxaparin (LOVENOX) injection  40 mg Subcutaneous Daily   feeding supplement  237 mL Oral BID BM   haloperidol  0.5 mg Oral Daily   hydrocerin   Topical BID   multivitamin with minerals  1 tablet Oral Daily   polyethylene glycol  17 g Oral Daily   senna-docusate  2 tablet Oral QHS   Tdap  0.5 mL Intramuscular Once    Continuous Infusions:     LOS: 350 days     Marcellus Scott, MD,  FACP, FHM, SFHM, [facility], [facility]   [facility] [place]      To contact the attending provider between 7A-7P or the covering provider during after hours 7P-7A, please log into the web site [email or web address] and access using universal [place] password for that web site. If you do not have the password, please call the hospital operator.  12/11/2022, 5:45 PM

## 2022-12-11 NOTE — Progress Notes (Signed)
Nutrition Follow-up  DOCUMENTATION CODES:   Severe malnutrition in context of chronic illness  INTERVENTION:  - Continue Ensure Enlive po BID, each supplement provides 350 kcal and 20 grams of protein.   - Continue Prosource Plus q day.   NUTRITION DIAGNOSIS:   Severe Malnutrition related to chronic illness (advanced dementia) as evidenced by energy intake < 75% for > or equal to 1 month, percent weight loss.  GOAL:   Patient will meet greater than or equal to 90% of their needs - Not met.   MONITOR:   PO intake, Supplement acceptance  REASON FOR ASSESSMENT:   LOS    ASSESSMENT:   57 y.o. female admits related to AMS. PMH includes: Bipolar disorde rand HTN. Pt is currently receiving medical management related to early onset dementia with behavioral disturbances.  Meds reviewed: VB12, MVI, Miralax, senokot. Labs reviewed: WDL.   Per record, pt has eaten 25-75% of her meals over the past 7 days. Per record, pt is accepting about 1 PS Plus and 1 Ensure per day. Pt continues to not meet her nutritional needs. Palliative care continues to follow along. Family has decided to not pursue feeding tube at this time. RD will continue to monitor POC.   Diet Order:   Diet Order             DIET DYS 3 Room service appropriate? Yes; Fluid consistency: Thin  Diet effective now                   EDUCATION NEEDS:   Not appropriate for education at this time  Skin:  Skin Assessment: Reviewed RN Assessment  Last BM:  8/18  Height:   Ht Readings from Last 1 Encounters:  02/03/22 5\' 6"  (1.676 m)    Weight:   Wt Readings from Last 1 Encounters:  12/03/22 43.6 kg    Ideal Body Weight:     BMI:  Body mass index is 15.51 kg/m.  Estimated Nutritional Needs:   Kcal:  1400-1700 kcals  Protein:  70-85 gm  Fluid:  >/= 1.4 L  Bethann Humble, RD, LDN, CNSC.

## 2022-12-12 DIAGNOSIS — E870 Hyperosmolality and hypernatremia: Secondary | ICD-10-CM

## 2022-12-12 DIAGNOSIS — E875 Hyperkalemia: Secondary | ICD-10-CM

## 2022-12-12 LAB — COMPREHENSIVE METABOLIC PANEL
ALT: 28 U/L (ref 0–44)
AST: 25 U/L (ref 15–41)
Albumin: 3.6 g/dL (ref 3.5–5.0)
Alkaline Phosphatase: 57 U/L (ref 38–126)
Anion gap: 14 (ref 5–15)
BUN: 17 mg/dL (ref 6–20)
CO2: 24 mmol/L (ref 22–32)
Calcium: 9.8 mg/dL (ref 8.9–10.3)
Chloride: 108 mmol/L (ref 98–111)
Creatinine, Ser: 1.05 mg/dL — ABNORMAL HIGH (ref 0.44–1.00)
GFR, Estimated: >60 mL/min (ref >60)
Glucose, Bld: 98 mg/dL (ref 70–99)
Potassium: 5.5 mmol/L — ABNORMAL HIGH (ref 3.5–5.1)
Sodium: 146 mmol/L — ABNORMAL HIGH (ref 135–145)
Total Bilirubin: 0.9 mg/dL (ref 0.3–1.2)
Total Protein: 7.1 g/dL (ref 6.5–8.1)

## 2022-12-12 LAB — CBC
HCT: 45.5 % (ref 36.0–46.0)
Hemoglobin: 14.6 g/dL (ref 12.0–15.0)
MCH: 29 pg (ref 26.0–34.0)
MCHC: 32.1 g/dL (ref 30.0–36.0)
MCV: 90.5 fL (ref 80.0–100.0)
Platelets: 304 10*3/uL (ref 150–400)
RBC: 5.03 MIL/uL (ref 3.87–5.11)
RDW: 14.5 % (ref 11.5–15.5)
WBC: 5 10*3/uL (ref 4.0–10.5)
nRBC: 0 % (ref 0.0–0.2)

## 2022-12-12 MED ORDER — SODIUM ZIRCONIUM CYCLOSILICATE 10 G PO PACK: 10.0000 g | PACK | Freq: Two times a day (BID) | ORAL | Status: AC

## 2022-12-12 MED ORDER — SODIUM CHLORIDE 0.45 % IV SOLN: INTRAVENOUS | Status: DC

## 2022-12-12 NOTE — Progress Notes (Addendum)
PROGRESS NOTE   Natalie Brown  HQI:[id number]    DOB: 08-19-1965    DOA: 12/24/2021  PCP: Lavinia Sharps, NP   I have briefly reviewed patients previous medical records in [facility].    Brief Narrative:   Patient is a 57 year old African-American female, past medical history significant for bipolar disorder and documented early onset dementia.  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  Patient is now medically stabilized.   Patient was initially admitted to [facility] last May 2023 with agitation and aggressive behaviors.  This was treated initially as manic Bipolar, but after several months in [facility] it became clear that she had significant cognitive impairment, and dementia was suspected.  Patient was observed to have significant short term memory impairment and worsening hygiene, MOCA 11/30 and CT head revealed advanced cerebral atrophy.     In early September 2023, patient was transferred for further evaluation and treatment due to concerns of new metabolic encephalopathy (that appear to have developed acutely over ~1 day).  Neurology and Psychiatry were consulted.  EEG was unremarkable.  Ammonia, TSH and RPR were normal.  MRI brain revealed no acute findings, but confirmed advanced atrophy.  Neurology suspected a particularly aggressive premature dementia.     While early notes implied that patient was forgetful but redirectable and oriented, she is now abulic and essentially nonverbal (nursing note occasional staff or some family members can elicit more responses, but they are very limited).  Patient has progressed beyond the ability to bathe or toilet independently.  Patient holds food in the mouth, and on recent calorie count (7/26-7/29) was observed to eat 50% of meals or less.  Nursing report she does not swallow her pills regularly due to pocketing of pills or holding pills in the mouth for long periods.  In the last 6months, she has had 22 kg weight loss and has  become noticeably weaker and more prone to falls when attempting to walk (which she cannot do safely without assistance).   Palliative have been consulted and family have shared that they do not want a feeding tube. Family are aware her dementia and weight loss are terminal. Goals of care are still actively being delineated - she remains total care and likely a complicated discharge.   8/15: No change, TOC is trying to help with Medicaid change. The patient has been deemed medically stable and is awaiting a disposition. The goal is to place her within the memory care unit but this is proving difficult as she is homeless and has no insurance. Per TOC patient's mother plans to go to DSS to apply for special assistance Medicaid.   As per discussion with TOC on 8/22, TOC team apparently he has exhausted all resources for placement/disposition at this time.  Thereby, in the absence of a safe disposition, patient will remain hospitalized indefinitely.  Significant events: 09/07/2021: admitted to behavioral health treated with Zyprexa Invega and Ativan challenge for catatonia. 12/24/2021: sent to ER from [facility] for evaluation for tachycardia 9/04-9/5, 9/5>9/9, LTM EEG  no seizure. TSH stable CTA chest no PE no pneumonia RPR nonreactive bilateral LE Doppler negative for DVT Echo with normal EF  9/23 febrile secondary to Covid 19 + no pneumonia. 9/25 repeat CT brain unremarkable. 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  EEG normal.  MRI brain showed advanced atrophy.  Per Neurology her condition was clearly irreversible at the time of first presentation and work up for autoimmune, paraneoplastic or prion diseases would not benefit  Continue Haldol, reduced dose as possible -0.5mg  BID 8/9, reduced again 8/18 to 0.5 mg daily. Remains nonverbal.  No agitation noted.  Has Posey belt.   Severe protein calorie malnutrition - Weight loss continues.   - Check BMP intermittently for hyperNa and renal function.  No labs since 7/27, ordered for tomorrow. - Continue nutrition supplements, MVI as much as able - Appreciate Palliative Care and dietitian consultations - Needs routine evals by palliative care for ongoing code status discussions with family; last seen by [facility] on 8/13, appreciate assistance  -Per nursing, patient usually fed by her mother who comes in daily.  No documentation as to how much patient eats, suspect is overall poor   Vitamin B12 Deficiency:   - Continue cyanocobalamin   Essential hypertension  - BP normalized off medication   CKD Stage II  - creatinine stable and normal range at this time   Hypernatremia: Possibly due to volume depletion.  Patient remains at high risk for recurrent issues for this due to significant AMS and inconsistent p.o. intake.  Remains full code.  Brief half-normal saline IV and follow-up BMP in a.m.  Addendum: As per nursing report, it took for staff members to get lab draws from patient.  Patient tends to rip out her IV lines.  Thereby decision made to not start IV fluids but try and increase oral fluid intake.  Hyperkalemia Potassium 5.5 on 8/22.  No mention about hemolysis.  Creatinine about the same as it was 3 weeks ago. Lokelma x 2 doses and follow BMP in AM.    Body mass index is 15.51 kg/m.  Nutritional Status Nutrition Problem: Severe Malnutrition Etiology: chronic illness (advanced  dementia) Signs/Symptoms: energy intake < 75% for > or equal to 1 month, percent weight loss Percent weight loss: 15 % (in 3 months) Interventions: Calorie Count, Ensure Enlive (each supplement provides 350kcal and 20 grams of protein), MVI    ACP Documents: Appreciated GOC note by palliative care team. DVT prophylaxis: enoxaparin (LOVENOX) injection 40 mg Start: 11/07/22 1000     Code Status: Full Code:  Family Communication: None at bedside Disposition:  Status is: Inpatient Remains inpatient appropriate because: Lack of safe disposition.     Consultants:   Palliative care medicine Psychiatry Neurology  Procedures:     Antimicrobials:      Subjective:  As per discussion with patient's RN, patient mostly nonverbal and will occasionally say nonspecific words such as "hi".  Patient nonverbal during my visit.  Moving all extremities nonpurposeful he.  Objective:   Vitals:   12/11/22 0813 12/11/22 2008 12/12/22 0457 12/12/22 0757  BP: 116/88 110/75 135/77 102/64  Pulse: 96 66 89 84  Resp: 18 17 15    Temp:  98.3 F (36.8 C) (!) 97.5 F (36.4 C)   TempSrc:   Oral   SpO2: 100% 100% 100%   Weight:      Height:  General exam: Young female, small built, frail, cachectic, chronically ill looking lying comfortably propped up in bed without distress. Respiratory system: Clear to auscultation. Respiratory effort normal. Cardiovascular system: S1 & S2 heard, RRR. No JVD, murmurs, rubs, gallops or clicks. No pedal edema. Gastrointestinal system: Abdomen is nondistended, soft and nontender. No organomegaly or masses felt. Normal bowel sounds heard.  Has Posey belt across abdomen. Central nervous system: Eyes open, does not track, crusting of eyelids and needs eye and mouth care. Extremities: Not sure if patient has contractures in the lower extremities, flexed at bilateral knees.  Appears to be moving all limbs somewhat. Skin: Did not not assess Psychiatry: Judgement and  insight impaired. Mood & affect flat and unable to assess    Data Reviewed:   I have personally reviewed following labs and imaging studies   CBC: Recent Labs  Lab 12/12/22 0736  WBC 5.0  HGB 14.6  HCT 45.5  MCV 90.5  PLT 304    Basic Metabolic Panel: Recent Labs  Lab 12/12/22 0736  NA 146*  K 5.5*  CL 108  CO2 24  GLUCOSE 98  BUN 17  CREATININE 1.05*  CALCIUM 9.8    Liver Function Tests: Recent Labs  Lab 12/12/22 0736  AST 25  ALT 28  ALKPHOS 57  BILITOT 0.9  PROT 7.1  ALBUMIN 3.6    CBG: No results for input(s): "GLUCAP" in the last 168 hours.  Microbiology Studies:  No results found for this or any previous visit (from the past 240 hour(s)).  Radiology Studies:  No results found.  Scheduled Meds:    (feeding supplement) PROSource Plus  30 mL Oral BID BM   vitamin B-12  500 mcg Oral Daily   enoxaparin (LOVENOX) injection  40 mg Subcutaneous Daily   feeding supplement  237 mL Oral BID BM   haloperidol  0.5 mg Oral Daily   hydrocerin   Topical BID   multivitamin with minerals  1 tablet Oral Daily   polyethylene glycol  17 g Oral Daily   senna-docusate  2 tablet Oral QHS   Tdap  0.5 mL Intramuscular Once    Continuous Infusions:     LOS: 351 days     Marcellus Scott, MD,  FACP, FHM, SFHM, [facility], [facility]   [facility] [place]      To contact the attending provider between 7A-7P or the covering provider during after hours 7P-7A, please log into the web site [email or web address] and access using universal [place] password for that web site. If you do not have the password, please call the hospital operator.  12/12/2022, 10:39 AM

## 2022-12-12 NOTE — Plan of Care (Signed)

## 2022-12-13 LAB — BASIC METABOLIC PANEL
Anion gap: 10 (ref 5–15)
BUN: 15 mg/dL (ref 6–20)
CO2: 25 mmol/L (ref 22–32)
Calcium: 9.1 mg/dL (ref 8.9–10.3)
Chloride: 105 mmol/L (ref 98–111)
Creatinine, Ser: 1.03 mg/dL — ABNORMAL HIGH (ref 0.44–1.00)
GFR, Estimated: >60 mL/min (ref >60)
Glucose, Bld: 106 mg/dL — ABNORMAL HIGH (ref 70–99)
Potassium: 4.2 mmol/L (ref 3.5–5.1)
Sodium: 140 mmol/L (ref 135–145)

## 2022-12-13 NOTE — Progress Notes (Signed)
PROGRESS NOTE   Natalie Brown  YQM:[id number]    DOB: May 24, 1965    DOA: 12/24/2021  PCP: Lavinia Sharps, NP   I have briefly reviewed patients previous medical records in [facility].    Brief Narrative:   Patient is a 57 year old African-American female, past medical history significant for bipolar disorder and documented early onset dementia.  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  Patient is now medically stabilized.   Patient was initially admitted to [facility] last May 2023 with agitation and aggressive behaviors.  This was treated initially as manic Bipolar, but after several months in [facility] it became clear that she had significant cognitive impairment, and dementia was suspected.  Patient was observed to have significant short term memory impairment and worsening hygiene, MOCA 11/30 and CT head revealed advanced cerebral atrophy.     In early September 2023, patient was transferred for further evaluation and treatment due to concerns of new metabolic encephalopathy (that appear to have developed acutely over ~1 day).  Neurology and Psychiatry were consulted.  EEG was unremarkable.  Ammonia, TSH and RPR were normal.  MRI brain revealed no acute findings, but confirmed advanced atrophy.  Neurology suspected a particularly aggressive premature dementia.     While early notes implied that patient was forgetful but redirectable and oriented, she is now abulic and essentially nonverbal (nursing note occasional staff or some family members can elicit more responses, but they are very limited).  Patient has progressed beyond the ability to bathe or toilet independently.  Patient holds food in the mouth, and on recent calorie count (7/26-7/29) was observed to eat 50% of meals or less.  Nursing report she does not swallow her pills regularly due to pocketing of pills or holding pills in the mouth for long periods.  In the last 6months, she has had 22 kg weight loss and has  become noticeably weaker and more prone to falls when attempting to walk (which she cannot do safely without assistance).   Palliative have been consulted and family have shared that they do not want a feeding tube. Family are aware her dementia and weight loss are terminal. Goals of care are still actively being delineated - she remains total care and likely a complicated discharge.   8/15: No change, TOC is trying to help with Medicaid change. The patient has been deemed medically stable and is awaiting a disposition. The goal is to place her within the memory care unit but this is proving difficult as she is homeless and has no insurance. Per TOC patient's mother plans to go to DSS to apply for special assistance Medicaid.   As per discussion with TOC on 8/22, TOC team apparently he has exhausted all resources for placement/disposition at this time.  Thereby, in the absence of a safe disposition, patient will remain hospitalized indefinitely.  Significant events: 09/07/2021: admitted to behavioral health treated with Zyprexa Invega and Ativan challenge for catatonia. 12/24/2021: sent to ER from [facility] for evaluation for tachycardia 9/04-9/5, 9/5>9/9, LTM EEG  no seizure. TSH stable CTA chest no PE no pneumonia RPR nonreactive bilateral LE Doppler negative for DVT Echo with normal EF  9/23 febrile secondary to Covid 19 + no pneumonia. 9/25 repeat CT brain unremarkable. 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  EEG normal.  MRI brain showed advanced atrophy.  Per Neurology her condition was clearly irreversible at the time of first presentation and work up for autoimmune, paraneoplastic or prion diseases would not benefit  Continue Haldol, reduced dose as possible -0.5mg  BID 8/9, reduced again 8/18 to 0.5 mg daily. Remains nonverbal.  No agitation noted.  Has Posey belt.   Severe protein calorie malnutrition - Weight loss continues.   - Check BMP intermittently for hyperNa and renal function.  No labs since 7/27, ordered for tomorrow. - Continue nutrition supplements, MVI as much as able - Appreciate Palliative Care and dietitian consultations - Needs routine evals by palliative care for ongoing code status discussions with family; last seen by [facility] on 8/13, appreciate assistance  -Up to 50% meal intake documented in the last 24 hours.   Vitamin B12 Deficiency:   - Continue cyanocobalamin   Essential hypertension  - BP normalized off medication   CKD Stage II  - creatinine stable and normal range at this time   Hypernatremia: Possibly due to volume depletion.  Patient remains at high risk for recurrent issues for this due to significant AMS and inconsistent p.o. intake.  Remains full code.  Encouraging oral fluid intake.  Difficult stick, pulls out IVs and hence IV fluids were avoided.  Patient's mom today at bedside also prefers no IVs if possible.  Unfortunately labs ordered for today have not yet been done, discussed with RN and lab supposed to draw them now.  Hyperkalemia Potassium 5.5 on 8/22.  No mention about hemolysis.  Creatinine about the same as it was 3 weeks ago. Lokelma x 2 doses were ordered but neither doses were given and MD was not informed.  Follow BMP now and address appropriately.    Body mass index is 15.51 kg/m.  Nutritional Status Nutrition Problem: Severe Malnutrition Etiology: chronic illness (advanced  dementia) Signs/Symptoms: energy intake < 75% for > or equal to 1 month, percent weight loss Percent weight loss: 15 % (in 3 months) Interventions: Calorie Count, Ensure Enlive (each supplement provides 350kcal and 20 grams of protein), MVI    ACP Documents: Appreciated GOC note by palliative care team. DVT prophylaxis: enoxaparin (LOVENOX) injection 40 mg Start: 11/07/22 1000     Code Status: Full Code:  Family Communication: Mother at bedside Disposition:  Status is: Inpatient Remains inpatient appropriate because: Lack of safe disposition.     Consultants:   Palliative care medicine Psychiatry Neurology  Procedures:     Antimicrobials:      Subjective:  Mother and other family members in the room.  Patient sitting/propped up in bed and eating with her right hand using a fork.  No concerns reported by mother.  Objective:   Vitals:   12/12/22 1621 12/12/22 1943 12/13/22 0334 12/13/22 0825  BP: 92/62 (!) 90/47 (!) 138/126 130/68  Pulse: 78 61 74 (!) 103  Resp:  18  18  Temp:  97.8 F (36.6 C)    TempSrc:      SpO2: 100% 100%    Weight:      Height:        General exam: Natalie Brown  female, small built, frail, cachectic, chronically ill looking seen sitting up in eating by herself as noted above. CNS: Alert but does not appear oriented and does not follow instructions. Extremities: Bilateral lower extremities always flexed at the knees, unsure if she has contractures.  Moves both upper extremities well. Psychiatry: Judgement and insight impaired. Mood & affect flat and unable to assess    Data Reviewed:   I have personally reviewed following labs and imaging studies   CBC: Recent Labs  Lab 12/12/22 0736  WBC 5.0  HGB 14.6  HCT 45.5  MCV 90.5  PLT 304    Basic Metabolic Panel: Recent Labs  Lab 12/12/22 0736  NA 146*  K 5.5*  CL 108  CO2 24  GLUCOSE 98  BUN 17  CREATININE 1.05*  CALCIUM 9.8    Liver Function Tests: Recent Labs  Lab  12/12/22 0736  AST 25  ALT 28  ALKPHOS 57  BILITOT 0.9  PROT 7.1  ALBUMIN 3.6    CBG: No results for input(s): "GLUCAP" in the last 168 hours.  Microbiology Studies:  No results found for this or any previous visit (from the past 240 hour(s)).  Radiology Studies:  No results found.  Scheduled Meds:    (feeding supplement) PROSource Plus  30 mL Oral BID BM   vitamin B-12  500 mcg Oral Daily   enoxaparin (LOVENOX) injection  40 mg Subcutaneous Daily   feeding supplement  237 mL Oral BID BM   haloperidol  0.5 mg Oral Daily   hydrocerin   Topical BID   multivitamin with minerals  1 tablet Oral Daily   polyethylene glycol  17 g Oral Daily   senna-docusate  2 tablet Oral QHS   Tdap  0.5 mL Intramuscular Once    Continuous Infusions:     LOS: 352 days     Marcellus Scott, MD,  FACP, FHM, SFHM, [facility], [facility]   [facility] [place]      To contact the attending provider between 7A-7P or the covering provider during after hours 7P-7A, please log into the web site [email or web address] and access using universal [place] password for that web site. If you do not have the password, please call the hospital operator.  12/13/2022, 2:44 PM

## 2022-12-13 NOTE — Progress Notes (Signed)
Pt slept throughout the night and awakened this am at approximately 545.  Pt sat up and offered drink.  She drank approximately 50cc water and then she pushed it away several times.  Pt encouraged to drink but refused.

## 2022-12-13 NOTE — Plan of Care (Signed)

## 2022-12-14 NOTE — Plan of Care (Signed)

## 2022-12-14 NOTE — Progress Notes (Signed)
  Progress Note   Patient: Natalie Brown CBJ:[id number] DOB: 09/07/1965 DOA: 12/24/2021     353 DOS: the patient was seen and examined on 12/14/2022       Brief hospital course: See summary from 7/31    Assessment and plan: Early onset dementia Bipolar disorder No change - Continue Haldol  Vitamin B12 deficiency - Continue vitamin B12  Severe protein calorie malnutrition - Continue nutrition supplements, MVI    Subjective: No nursing concerns, patient nonverbal  Objective: BP 100/66 (BP Location: Right Arm)   Pulse 85   Temp 97.6 F (36.4 C)   Resp 18   Ht 5\' 6"  (1.676 m)   Wt 43.6 kg   SpO2 100%   BMI 15.51 kg/m  Patient is slow and catatonic, looks in my direction, makes no verbalizations, no purposeful movements.  Tries to move my stethoscope. RRR, I do not appreciate murmurs, but she pushes my stethoscope away Nonverbal, does not follow commands    Author: Alberteen Sam, MD 12/14/2022 3:47 PM  For on call review [email or web address].

## 2022-12-14 NOTE — Progress Notes (Signed)
Assumed care of pt at this time. Pt in bed with eyes open, resp. Nonlabored,  MAEW. Lapbelt in use for safety. Currently no acute distress noted.

## 2022-12-15 NOTE — Progress Notes (Addendum)
  Progress Note   Patient: Natalie Brown ZOX:[id number] DOB: 07/19/1965 DOA: 12/24/2021     354 DOS: the patient was seen and examined on 12/15/2022 at 10:17AM      Brief hospital course: 57 y.o. F with premature dementia and Bipolar disorder admitted from [facility] with catatonia.  Evaluated by Neurology and Psychiatry and Medicine service and felt to have severe and rapidly progressive dementia.  Progressively deteriorating and now losing weight.       Assessment and plan: Early onset dementia Bipolar disorder No change - Continue Haldol, tapering haldol this week   Vitamin B12 deficiency - Continue vitamin B12   Severe protein calorie malnutrition - Continue nutrition supplements, MVI       Subjective: No nursing concerns, patient nonverbal   Objective: BP (!) 146/113 (BP Location: Right Arm)   Pulse (!) 40   Temp 97.6 F (36.4 C) (Oral)   Resp 18   Ht 5\' 6"  (1.676 m)   Wt 46 kg   SpO2 100%   BMI 16.37 kg/m   Patient is slow and catatonic, looks in my direction, makes no verbalizations, no purposeful movements.  Tries to move my stethoscope. RRR, I do not appreciate murmurs, but she pushes my stethoscope away Nonverbal, does not follow commands            Author: Alberteen Sam, MD 12/15/2022 11:19 AM  For on call review [email or web address].

## 2022-12-15 NOTE — Plan of Care (Signed)

## 2022-12-16 NOTE — Progress Notes (Signed)
   12/16/22 1058  Assess: MEWS Score  Temp 97.6 F (36.4 C)  BP 100/64  MAP (mmHg) 75  Pulse Rate 99  Resp 18  O2 Device Room Air  Assess: MEWS Score  MEWS Temp 0  MEWS Systolic 1  MEWS Pulse 0  MEWS RR 0  MEWS LOC 1  MEWS Score 2  MEWS Score Color Yellow  Assess: SIRS CRITERIA  SIRS Temperature  0  SIRS Pulse 1  SIRS Respirations  0  SIRS WBC 0  SIRS Score Sum  1   Patient is asymptomatic and at baseline.

## 2022-12-16 NOTE — Plan of Care (Signed)

## 2022-12-16 NOTE — TOC Progression Note (Addendum)
Transition of Care [facility]) - Progression Note    Patient Details  Name: Natalie Brown MRN: [id number] Date of Birth: 1965-11-21  Transition of Care [facility]) CM/SW Contact  Inis Sizer, LCSW Phone Number: 12/16/2022, 9:04 AM  Clinical Narrative:    11:45am: CSW received call from Cathlyn Parsons [phone]) at [facility] DSS stating she will email CSW a form to complete and be signed by patient's guardian to grant CSW authorized user privileges to assist with the program change.  CSW spoke with Natalie Brown to request she sign form upon arrival to the hospital. Natalie Brown agreeable for CSW to leave form with unit secretary and obtain it once she has signed it.  9:40am: CSW received return call from patient's mother Natalie Brown stating she called the Medicaid hotline and was instructed to go the local DSS office to initiate the Medicaid change process. Natalie Brown has been to the [facility] DSS at least 5 times attempting to address this issue without resolution.  CSW contacted [facility] Director to determine what staff member at DSS is in charge so that this issue can be addressed.  CSW attempted to reach  Tracie Harrier, Dietitian at [employer] [phone]) without success - a voicemail was left requesting a return call.  CSW sent secure email to Marisue Brooklyn, division director and Tracie Harrier, program manager requesting assistance.  9am: CSW spoke with Marcelino Duster at [facility] [phone], option [phone]) who states patient's mother did not provide verbal consent for Medicaid staff to speak to CSW.  CSW spoke with patient's mother Natalie Brown to request she return call to Medicaid to provide consent for them to speak with CSW to assist with discharge planning efforts. Natalie Brown was agreeable and will return call to CSW once completed.     Barriers to Discharge: Financial Resources, Inadequate or no insurance, Homeless with medical needs  Expected Discharge Plan and Services In-house Referral: Clinical Social Work    Post Acute Care Choice: Nursing Home Living arrangements for the past 2 months: Homeless [facility]) Expected Discharge Date: 12/24/21                                     Social Determinants of Health (SDOH) Interventions SDOH Screenings   Food Insecurity: No Food Insecurity (03/05/2022)  Housing: High Risk (03/21/2022)  Alcohol Screen: Low Risk  (09/06/2021)  Depression (PHQ2-9): Medium Risk (05/26/2020)  Tobacco Use: High Risk (03/21/2022)    Readmission Risk Interventions     No data to display

## 2022-12-16 NOTE — Progress Notes (Signed)
  Progress Note   Patient: Natalie Brown ZOX:[id number] DOB: 01/24/1966 DOA: 12/24/2021     355 DOS: the patient was seen and examined on 12/16/2022 at 8:39AM      Brief hospital course: 57 y.o. F with premature dementia and Bipolar disorder admitted from [facility] with catatonia.  Evaluated by Neurology and Psychiatry and Medicine service and felt to have severe and rapidly progressive dementia.  Progressively deteriorating and now losing weight.       Assessment and plan: Early onset dementia Bipolar disorder She has had no clinical change since tapering her Haldol in the last few weeks, so I will continue the taper and stop Haldol today - Stop Haldol   Vitamin B12 deficiency - Continue vitamin B12   Severe protein calorie malnutrition - Continue nutrition supplements, MVI       Subjective: No nursing concerns, patient nonverbal   Objective: BP 101/65 (BP Location: Right Arm)   Pulse 84   Temp 99 F (37.2 C) (Oral)   Resp 18   Ht 5\' 6"  (1.676 m)   Wt 46 kg   SpO2 100%   BMI 16.37 kg/m   Patient is slow and catatonic, looks in my direction, makes no verbalizations, no purposeful movements.   RRR, I do not appreciate murmurs, regular, no LE edema Respirations appears comfortable Nonverbal, does not follow commands         Author: Alberteen Sam, MD 12/16/2022 9:46 AM  For on call review [email or web address].

## 2022-12-17 NOTE — TOC Progression Note (Signed)
Transition of Care [facility]) - Progression Note    Patient Details  Name: Natalie Brown MRN: [id number] Date of Birth: [birth year]  Transition of Care [facility]) CM/SW Contact  Inis Sizer, LCSW Phone Number: 12/17/2022, 1:13 PM  Clinical Narrative:    2pm: CSW spoke with patient's mother [name] to request she notify CSW when she comes to the hospital to meet with NP and CSW. [name] states she will notify CSW upon arrival.  1:30pm: CSW spoke with MD and Corrie Dandy, NP regarding plan for patient. Mary requesting to meet with patient's mother with CSW to discuss MOST form for future planning.  1pm: CSW spoke with [place] broker who states it takes 24 hours before changes are reflected in the system and she is unable to provide CSW with any further details.  CSW will return call to [facility] tomorrow to determine if changes have been made.  8am: CSW submitted signed Proofreader form to [employer] at [facility] DSS for [employer].     Barriers to Discharge: Financial Resources, Inadequate or no insurance, Homeless with medical needs  Expected Discharge Plan and Services In-house Referral: Clinical Social Work   Post Acute Care Choice: Nursing Home Living arrangements for the past 2 months: Homeless [facility]) Expected Discharge Date: 12/24/21                                     Social Determinants of Health (SDOH) Interventions SDOH Screenings   Food Insecurity: No Food Insecurity (03/05/2022)  Housing: High Risk (03/21/2022)  Alcohol Screen: Low Risk  (09/06/2021)  Depression (PHQ2-9): Medium Risk (05/26/2020)  Tobacco Use: High Risk (03/21/2022)    Readmission Risk Interventions     No data to display

## 2022-12-17 NOTE — Plan of Care (Signed)
  Problem: Clinical Measurements: Goal: Ability to maintain clinical measurements within normal limits will improve Outcome: Progressing Goal: Will remain free from infection Outcome: Progressing Goal: Diagnostic test results will improve Outcome: Progressing Goal: Respiratory complications will improve Outcome: Progressing Goal: Cardiovascular complication will be avoided Outcome: Progressing   Problem: Activity: Goal: Risk for activity intolerance will decrease Outcome: Progressing   Problem: Nutrition: Goal: Adequate nutrition will be maintained Outcome: Progressing   Problem: Coping: Goal: Level of anxiety will decrease Outcome: Progressing   Problem: Elimination: Goal: Will not experience complications related to bowel motility Outcome: Progressing Goal: Will not experience complications related to urinary retention Outcome: Progressing   Problem: Pain Managment: Goal: General experience of comfort will improve Outcome: Progressing   Problem: Safety: Goal: Ability to remain free from injury will improve Outcome: Progressing   Problem: Skin Integrity: Goal: Risk for impaired skin integrity will decrease Outcome: Progressing   Problem: Safety: Goal: Non-violent Restraint(s) Outcome: Progressing

## 2022-12-17 NOTE — Progress Notes (Signed)
Progress Note   Patient: Natalie Brown:[id number] DOB: [birth year] DOA: 12/24/2021     356 DOS: the patient was seen and examined on 12/17/2022 at 8:40AM      Brief hospital course: Mrs. Service is a 57 y.o. 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    Patient was initially admitted to [facility] last May 2023 with agitation and aggressive behaviors.  This was treated initially as manic Bipolar, but after several months in [facility] it became clear that she had significant cognitive impairment, and dementia was suspected.  Patient was observed to have significant short term memory impairment and worsening hygiene, MOCA 11/30 and CT head revealed advanced cerebral atrophy.     In early September 2023, patient was transferred for further evaluation and treatment due to concerns of new metabolic encephalopathy (that appear to have developed acutely over ~1 day).  Neurology and Psychiatry were consulted.  EEG was unremarkable.  Ammonia, TSH and RPR were normal.  MRI brain revealed no acute findings, but confirmed advanced atrophy.  Neurology suspected a particularly aggressive premature dementia.     While early notes implied that patient was forgetful but redirectable and oriented, she is now abulic and essentially nonverbal (nursing note occasional staff or some family members can elicit more responses, but they are very limited).  Patient has progressed beyond the ability to bathe or toilet independently.  Patient holds food in the mouth, and on recent calorie count (7/26-7/29) was observed to eat 50% of meals or less.  Nursing report she does not swallow her pills regularly due to pocketing of pills or holding pills in the mouth for long periods.  In the last 6 months, she has had 22 kg weight loss and has become noticeably weaker and more prone to falls when attempting to walk (which she cannot do safely without  assistance).   Palliative have been consulted and have met with family. Family are aware her dementia and weight loss are terminal.           Assessment and Plan: Early onset dementia  Bipolar Disorder Ammonia, TSH, RPR normal.  EEG normal.  MRI brain shows advanced atrophy.    She has been evaluated by Neurology and Psychiatry and I agree with Neurology that her condition was clearly irreversible at the time of first presentation and work up for autoimmune, paraneoplastic or prion diseases would have been futile.   Now probably Fast 7C or D dementia. Mother and sister aware of terminal condition.  She was weaned off of Haldol over the last 4 months with no discernible change in behavior. - Full supportive care - Consult Palliative Care    Severe protein calorie malnutrition Weight loss is down another 4 kg from last month.  I suspect this will be her terminal condition soon. - Continue nutrition supplements, MVI as much as able - Appreciate Palliative Care and dietitian consultations  - Check BMP only for change in mentation   - In light of the high degree of certainty that her medical condition is progressive, irreversible and terminal, it is my medical opinion that tube feeds and CPR are futile and should not be offered.  Patient pulls out all IV lines, and there is a high degree of certainty that IV fluids would be difficult and futile as well.     Vitamin B12 Deficiency:   - Continue cyanocobalamin   Essential hypertension  BP  normalized off medication   CKD Stage IIIa Cr stable          Subjective: Nonverbal.  No new nursing concerns.      Physical Exam: BP 102/66 (BP Location: Right Arm)   Pulse (!) 108   Temp 98 F (36.7 C) (Oral)   Resp 15   Ht 5\' 6"  (1.676 m)   Wt 45.8 kg   SpO2 100%   BMI 16.30 kg/m   Patient is slow and catatonic, looks in my direction, makes no verbalizations, no purposeful movements.  Tries to move my stethoscope. RRR,  I do not appreciate murmurs, but she pushes my stethoscope away Nonverbal, does not follow commands       Disposition: Status is: Inpatient Patient was transferred for evaluation of worsening dementia.   This is now severe and end stage.    We recommend palliative care.  Family are supportive of patient but unable to care for her at home.  TOC are working towards long term placement, likely in the coming weeks        Author: Alberteen Sam, MD 12/17/2022 1:42 PM  For on call review [email or web address].

## 2022-12-17 NOTE — Progress Notes (Signed)
Patient ID: Natalie Brown, female   DOB: 1966-01-27, 57 y.o.   MRN: [id number]    Progress Note from the Palliative Medicine Team at [facility]   Patient Name: Natalie Brown        Date: 12/17/2022 DOB: 1965/04/30  Age: 57 y.o. MRN#: [id number] Attending Physician: Alberteen Sam, * Primary Care Physician: Lavinia Sharps, NP Admit Date: 12/24/2021    Extensive chart review has been completed prior to meeting with patient/family  including labs, vital signs, imaging, progress/consult notes, orders, medications and available advance directive documents.   57 y.o. female   admitted on 12/24/2021 medically for 12/26/2021  1:58 AM for possible catatonia/altered mental status. She carries the psychiatric diagnoses of bipolar affective disorder with psychotic symptoms and has a past medical history of hypertension.  I met with this family initially on July 29,2024   At that time patient's family reported dense psychiatric history going back 20 years.   Patient initially was admitted to behavioral health where she was initially admitted with mania, dementia diagnosis was prescribed at that time.  IMPRESSION:   MRI 12/24/2021 1. No acute intracranial abnormality. 2. Advanced cerebral and cerebellar atrophy.  Today is day 344 of this hospital stay.    Patient's alertness/'s cognitive state continues to  wax and wane day-to-day.    Patient continues to fail to thrive.  Overall decreased  oral intake, per dietitian note she has lost 15% of her body weight in the last 3 months.  Patient does not have medical decision-making capacity. [name] basically not verbal and does not follow commands for me today   Mother at bedside.    Family face ongoing  treatment option decisions, advanced directive decisions and anticipatory care needs.   Ongoing education on the neurodegenerative disease of dementia and its natural trajectory. This is hard for patient's mother to hear and understand the  limitations of medical interventions to treat or prolong quality of life in a young patient with severe dementia.   A  discussion was had today regarding advanced directives.  Concepts specific to code status, artifical feeding and hydration, continued IV antibiotics and rehospitalization was had.          Educated family to consider DNR/DNI status understanding evidenced based poor outcomes in similar hospitalized patients, as the cause of arrest is likely associated with advanced chronic illness rather than an easily reversible acute cardio-pulmonary event.      Patient remains a full code.   Education offered today to mother regarding  the importance of continued conversation with her  family and the medical providers regarding overall plan of care and treatment options,  ensuring decisions are within the context of the patients values and GOCs.  Questions and concerns addressed   Discussed with Edwin Dada LCSW  Emotional support offered   Time: 50 minutes  Detailed review of medical records ( labs, imaging, vital signs), medically appropriate exam ( MS, skin, resp)   discussed with treatment team, counseling and education to patient, family, staff, documenting clinical information, medication management, coordination of care    Lorinda Creed NP  Palliative Medicine Team Team Phone # [phone] Pager [phone]

## 2022-12-17 NOTE — Progress Notes (Signed)
Plan is for patient to discharge to inpatient facility. It is encouraged patient is to NOT have restraints and/or safety sitter per unit administrator. Restraints removed, bed alarm set, non-skid socks on, and bed at lowest position. Patient currently eating. Care continues.

## 2022-12-18 DIAGNOSIS — Z66 Do not resuscitate: Secondary | ICD-10-CM

## 2022-12-18 NOTE — Progress Notes (Signed)
Pt had a good night slept most of the night. Complete bath performed this AM. Pt uncooperative when changing her clothes.

## 2022-12-18 NOTE — Progress Notes (Signed)
Assumed care of pt, mother at bedside. Pt sleeping. No restraints present. Fall risk interventions active.

## 2022-12-18 NOTE — TOC Progression Note (Addendum)
Transition of Care [facility]) - Progression Note    Patient Details  Name: Natalie Brown MRN: [id number] Date of Birth: 27-Oct-1965  Transition of Care [facility]) CM/SW Contact  Inis Sizer, LCSW Phone Number: 12/18/2022, 10:52 AM  Clinical Narrative:    11:05am: CSW and Corrie Dandy, NP met with patient's mother [name] at bedside to discuss discharge planning efforts and MOST form. [name] agreeable for CSW to continue to work on getting patient's Medicaid changed so that can be placed for LTC in a SNF. MOST form was thoroughly explained and reasons for completing the form were also discussed. [name] adamant that she does not want patient to receive CPR nor does she want her to receive a feeding tube if oral intake continues to decrease. Cornelia Copa, NP both signed form and a copy was given to [place] for review and discussion with other family members.  10:40am: CSW spoke with RN who states patient's mother is at bedside now.  CSW notified Corrie Dandy, NP of information.    Barriers to Discharge: Financial Resources, Inadequate or no insurance, Homeless with medical needs  Expected Discharge Plan and Services In-house Referral: Clinical Social Work   Post Acute Care Choice: Nursing Home Living arrangements for the past 2 months: Homeless [facility]) Expected Discharge Date: 12/24/21                                     Social Determinants of Health (SDOH) Interventions SDOH Screenings   Food Insecurity: No Food Insecurity (03/05/2022)  Housing: High Risk (03/21/2022)  Alcohol Screen: Low Risk  (09/06/2021)  Depression (PHQ2-9): Medium Risk (05/26/2020)  Tobacco Use: High Risk (03/21/2022)    Readmission Risk Interventions     No data to display

## 2022-12-18 NOTE — Progress Notes (Signed)
Progress Note   Patient: Natalie Brown DOB: February 17, 1966 DOA: 12/24/2021     357 DOS: the patient was seen and examined on 12/18/2022   Subjective information: Patient is nonverbal and so unable to obtain subjective information  Brief hospital course: Natalie Brown is a 57 y.o. 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     Patient was initially admitted to [facility] last May 2023 with agitation and aggressive behaviors.  This was treated initially as manic Bipolar, but after several months in [facility] it became clear that she had significant cognitive impairment, and dementia was suspected.  Patient was observed to have significant short term memory impairment and worsening hygiene, MOCA 11/30 and CT head revealed advanced cerebral atrophy.     In early September 2023, patient was transferred for further evaluation and treatment due to concerns of new metabolic encephalopathy (that appear to have developed acutely over ~1 day).  Neurology and Psychiatry were consulted.  EEG was unremarkable.  Ammonia, TSH and RPR were normal.  MRI brain revealed no acute findings, but confirmed advanced atrophy.  Neurology suspected a particularly aggressive premature dementia.     While early notes implied that patient was forgetful but redirectable and oriented, she is now abulic and essentially nonverbal (nursing note occasional staff or some family members can elicit more responses, but they are very limited).  Patient has progressed beyond the ability to bathe or toilet independently.  Patient holds food in the mouth, and on recent calorie count (7/26-7/29) was observed to eat 50% of meals or less.  Nursing report she does not swallow her pills regularly due to pocketing of pills or holding pills in the mouth for long periods.  In the last 6 months, she has had 22 kg weight loss and has become noticeably weaker and  more prone to falls when attempting to walk (which she cannot do safely without assistance).   Palliative have been consulted and have met with family. Family are aware her dementia and weight loss are terminal.        Assessment and Plan: Early onset dementia  Bipolar Disorder Ammonia, TSH, RPR normal.  EEG normal.  MRI brain shows advanced atrophy.     She has been evaluated by Neurology and Psychiatry and we agree with Neurology that her condition was clearly irreversible at the time of first presentation and work up for autoimmune, paraneoplastic or prion diseases would have been futile.   Mother and sister aware of terminal condition.   She was weaned off of Haldol over the last 4 months with no discernible change in behavior. - Full supportive care - Consult Palliative Care     Severe protein calorie malnutrition Weight loss is down another 4 kg from last month.  I suspect this will be her terminal condition soon. - Continue nutrition supplements, MVI as much as able - Appreciate Palliative Care and dietitian consultations  - Check BMP only for change in mentation   - In light of the high degree of certainty that her medical condition is progressive, irreversible and terminal, it is my medical opinion that tube feeds and CPR are futile and should not be offered.  Patient pulls out all IV lines, and there is a high degree of certainty that IV fluids would be difficult and futile as well.       Vitamin B12 Deficiency:   - Continue cyanocobalamin  Essential hypertension  BP normalized off medication   CKD Stage IIIa Cr stable       Physical Exam: BP 102/66 (BP Location: Right Arm)   Pulse (!) 108   Temp 98 F (36.7 C) (Oral)   Resp 15   Ht 5\' 6"  (1.676 m)   Wt 45.8 kg   SpO2 100%   BMI 16.30 kg/m   Patient is slow and catatonic unable to follow commands and does not make any bowel isolation Seen having no purposeful movements Respiratory system: Breath sounds  normal not labored Abdomen: Does not elicit any tenderness     Disposition: Status is: Inpatient Patient was transferred for evaluation of worsening dementia.   This is now severe and end stage.    We recommend palliative care.  Family are supportive of patient but unable to care for her at home.  TOC are working towards long term placement, likely in the coming weeks    Vitals:   12/17/22 0518 12/17/22 0635 12/17/22 1956 12/18/22 0505  BP:  102/66 99/65 116/74  Pulse:  (!) 108 64 100  Resp:  15 15 18   Temp:  98 F (36.7 C) 98.1 F (36.7 C) 98.3 F (36.8 C)  TempSrc:  Oral  Oral  SpO2:  100% 100% 96%  Weight: 45.8 kg     Height:        Data Reviewed: I have reviewed the nursing documentation, TOC documentation as well as previous providers documentation  Author: Loyce Dys, MD 12/18/2022 1:00 PM  For on call review [email or web address].

## 2022-12-18 NOTE — Plan of Care (Signed)
  Problem: Activity: Goal: Risk for activity intolerance will decrease Outcome: Progressing   Problem: Nutrition: Goal: Adequate nutrition will be maintained Outcome: Progressing   Problem: Pain Managment: Goal: General experience of comfort will improve Outcome: Progressing   Problem: Safety: Goal: Ability to remain free from injury will improve Outcome: Progressing   Problem: Skin Integrity: Goal: Risk for impaired skin integrity will decrease Outcome: Progressing   

## 2022-12-18 NOTE — Progress Notes (Signed)
Nutrition Follow-up  DOCUMENTATION CODES:   Severe malnutrition in context of chronic illness  INTERVENTION:  - Continue Ensure Enlive po BID, each supplement provides 350 kcal and 20 grams of protein.   - Continue Prosource Plus BID day.   NUTRITION DIAGNOSIS:   Severe Malnutrition related to chronic illness (advanced dementia) as evidenced by energy intake < 75% for > or equal to 1 month, percent weight loss.  GOAL:   Patient will meet greater than or equal to 90% of their needs  MONITOR:   PO intake, Supplement acceptance  REASON FOR ASSESSMENT:   LOS    ASSESSMENT:   57 y.o. female admits related to AMS. PMH includes: Bipolar disorde rand HTN. Pt is currently receiving medical management related to early onset dementia with behavioral disturbances.  Meds reviewed:  Vitamin B12, MVI, miralax, senokot. Labs reviewed: WDL.   RN reports that the pt ate 40% of her breakfast this am. Intakes continue to vary from poor to good per record. Weights continue to trend down. RD team has exhausted all measures for intervention at this time. RD team will continue to follow along, as patient remains full code. However, family did decide that they would not want the the patient to have a feeding tube now or in the future (per Palliative NP note on 7/31).   If POC changes and any immediate needs arise, please re-consult.   Diet Order:   Diet Order             DIET DYS 3 Room service appropriate? Yes; Fluid consistency: Thin  Diet effective now                   EDUCATION NEEDS:   Not appropriate for education at this time  Skin:  Skin Assessment: Reviewed RN Assessment  Last BM:  8/24 - type 6  Height:   Ht Readings from Last 1 Encounters:  02/03/22 5\' 6"  (1.676 m)    Weight:   Wt Readings from Last 1 Encounters:  12/17/22 45.8 kg    Ideal Body Weight:     BMI:  Body mass index is 16.3 kg/m.  Estimated Nutritional Needs:   Kcal:  1400-1700  kcals  Protein:  70-85 gm  Fluid:  >/= 1.4 L  Bethann Humble, RD, LDN, CNSC.

## 2022-12-19 NOTE — Plan of Care (Signed)
  Problem: Clinical Measurements: Goal: Will remain free from infection Outcome: Progressing   Problem: Nutrition: Goal: Adequate nutrition will be maintained Outcome: Progressing   Problem: Elimination: Goal: Will not experience complications related to bowel motility Outcome: Progressing   Problem: Pain Managment: Goal: General experience of comfort will improve Outcome: Progressing   Problem: Safety: Goal: Ability to remain free from injury will improve Outcome: Progressing   

## 2022-12-19 NOTE — OR Nursing (Signed)
Patient has lap belt back on due to not being redirectable. Patient would not let us change her clothes. Tried to explain to her multiple times that her clothes were soiled in stool and we needed to change her. Was clenching onto clothes, finally got her to release her clothes. Would not just stand for [place] to clean her up. Walked to the bed to sit while soiled. Once on the bed got patients clothes pulled off. Patient was aggressive, kicking her legs up in the air. Mittens placed so we could change her whole bed due to it being soiled from stool. Patient was fighting [place] while trying to clean her up,change the bed,and her clothes. Patient trying to get out of the bed multiple times during this bed change. Patient also grasping onto side rails and not letting go while trying to roll her to her left/right side.

## 2022-12-19 NOTE — TOC Progression Note (Addendum)
Transition of Care [facility]) - Progression Note    Patient Details  Name: Natalie Brown MRN: [id number] Date of Birth: 02-Aug-1965  Transition of Care [facility]) CM/SW Contact  Inis Sizer, LCSW Phone Number: 12/19/2022, 10:09 AM  Clinical Narrative:    2pm: After several failed attempts to change patient's Medicaid to a traditional plan, CSW was informed that the standard Medicaid plans were replaced by tailored plans on 10/21/22. Patient is not eligible for a program change at this time.  CSW spoke with [facility] Director regarding patient - [facility] Director agreeable to provide any accepting facility with an LOG to cover room and board costs.  CSW spoke with Velna Hatchet at [facility] regarding patient - Velna Hatchet will escalate referral and have administration review.  CSW sent referral to other facilities that accept LOG's for review.  12pm: CSW spoke with [employer] [employer] [facility] DSS. [employer] has attempted to get patient's Medicaid program changed through the [facility] Medicaid hotline without success. [employer] will return call to [facility] Medicaid to request patient be placed in a standard, non-managed, traditional plan.  10am: CSW spoke with Dondra Prader at [facility] who states CSW has been added to the patient's file as an authorized user. Dondra Prader states CSW must contact DSS directly to request a program change.  CSW attempted to reach [place] at [facility] DSS without success.  CSW sent secure email to several DSS requesting assistance on the program change.    Barriers to Discharge: Financial Resources, Inadequate or no insurance, Homeless with medical needs  Expected Discharge Plan and Services In-house Referral: Clinical Social Work   Post Acute Care Choice: Nursing Home Living arrangements for the past 2 months: Homeless [facility]) Expected Discharge Date: 12/24/21                                     Social Determinants of Health (SDOH) Interventions SDOH Screenings   Food Insecurity: No Food  Insecurity (03/05/2022)  Housing: High Risk (03/21/2022)  Alcohol Screen: Low Risk  (09/06/2021)  Depression (PHQ2-9): Medium Risk (05/26/2020)  Tobacco Use: High Risk (03/21/2022)    Readmission Risk Interventions     No data to display

## 2022-12-19 NOTE — Progress Notes (Signed)
Progress Note   Patient: Natalie Brown [id number]:[id number] DOB: 25-Jun-1965 DOA: 12/24/2021     358 DOS: the patient was seen and examined on 12/19/2022   Subjective information: Patient still nonverbal was somewhat agitated this morning and was trying to get out of bed with head forward leaning towards the floor. Nursing staff notified me and requested for lap belt for restraints Patient seen later and currently much more calm in bed  Brief hospital course: Natalie Brown is a 57 y.o. 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   Patient was initially admitted to [facility] last May 2023 with agitation and aggressive behaviors.  This was treated initially as manic Bipolar, but after several months in [facility] it became clear that she had significant cognitive impairment, and dementia was suspected.  Patient was observed to have significant short term memory impairment and worsening hygiene, MOCA 11/30 and CT head revealed advanced cerebral atrophy.     In early September 2023, patient was transferred for further evaluation and treatment due to concerns of new metabolic encephalopathy (that appear to have developed acutely over ~1 day).  Neurology and Psychiatry were consulted.  EEG was unremarkable.  Ammonia, TSH and RPR were normal.  MRI brain revealed no acute findings, but confirmed advanced atrophy.  Neurology suspected a particularly aggressive premature dementia.     While early notes implied that patient was forgetful but redirectable and oriented, she is now abulic and essentially nonverbal (nursing note occasional staff or some family members can elicit more responses, but they are very limited).  Patient has progressed beyond the ability to bathe or toilet independently.  Patient holds food in the mouth, and on recent calorie count (7/26-7/29) was observed to eat 50% of meals or less.  Nursing report she does not  swallow her pills regularly due to pocketing of pills or holding pills in the mouth for long periods.  In the last 6 months, she has had 22 kg weight loss and has become noticeably weaker and more prone to falls when attempting to walk (which she cannot do safely without assistance).   Palliative have been consulted and have met with family. Family are aware her dementia and weight loss are terminal.        Assessment and Plan: Early onset dementia  Bipolar Disorder Ammonia, TSH, RPR normal.  EEG normal.  MRI brain shows advanced atrophy.     She has been evaluated by Neurology and Psychiatry and we agree with Neurology that her condition was clearly irreversible at the time of first presentation and work up for autoimmune, paraneoplastic or prion diseases would have been futile.   Mother and sister aware of terminal condition.   She was weaned off of Haldol over the last 4 months with no discernible change in behavior. - Full supportive care - Palliative care on board we appreciate input     Severe protein calorie malnutrition Weight loss is down another 4 kg from last month.  I suspect this will be her terminal condition soon. - Continue nutrition supplements, MVI as much as able - Appreciate Palliative Care and dietitian consultations  - Check BMP only for change in mentation   - In light of the high degree of certainty that her medical condition is progressive, irreversible and terminal, it is my medical opinion that tube feeds and CPR are futile and should not be offered.  Patient pulls out all  IV lines, and there is a high degree of certainty that IV fluids would be difficult and futile as well.   Vitamin B12 Deficiency:   - Continue cyanocobalamin   Essential hypertension  BP normalized off medication   CKD Stage IIIa Cr stable   Physical Exam: Patient does not follow commands appears catatonic trying to get out of bed Seen having no purposeful movements Respiratory  system: Breath sounds normal not labored Abdomen: Does not elicit any tenderness     Disposition: Status is: Inpatient Patient was transferred for evaluation of worsening dementia.   This is now severe and end stage.    We recommend palliative care.  Family are supportive of patient but unable to care for her at home.  TOC are working towards long term placement, likely in the coming weeks   Data Reviewed: I have reviewed nursing documentation, transition of care management documentation and case discussed   Vitals:   12/18/22 0505 12/18/22 1718 12/18/22 2210 12/19/22 1054  BP: 116/74 (!) 150/72 126/75 106/67  Pulse: 100 (!) 105 89 84  Resp: 18     Temp: 98.3 F (36.8 C)  98.3 F (36.8 C) 98 F (36.7 C)  TempSrc: Oral  Oral   SpO2: 96% 100% 100% 100%  Weight:      Height:         Author: Loyce Dys, MD 12/19/2022 1:34 PM  For on call review [email or web address].

## 2022-12-20 NOTE — Plan of Care (Signed)
  Problem: Clinical Measurements: Goal: Will remain free from infection Outcome: Progressing Goal: Diagnostic test results will improve Outcome: Progressing Goal: Respiratory complications will improve Outcome: Progressing Goal: Cardiovascular complication will be avoided Outcome: Progressing   Problem: Activity: Goal: Risk for activity intolerance will decrease Outcome: Progressing   Problem: Nutrition: Goal: Adequate nutrition will be maintained Outcome: Progressing   Problem: Coping: Goal: Level of anxiety will decrease Outcome: Progressing   Problem: Elimination: Goal: Will not experience complications related to bowel motility Outcome: Progressing Goal: Will not experience complications related to urinary retention Outcome: Progressing   Problem: Pain Managment: Goal: General experience of comfort will improve Outcome: Progressing   Problem: Safety: Goal: Ability to remain free from injury will improve Outcome: Progressing   Problem: Skin Integrity: Goal: Risk for impaired skin integrity will decrease Outcome: Progressing   Problem: Safety: Goal: Non-violent Restraint(s) Outcome: Progressing

## 2022-12-20 NOTE — Progress Notes (Signed)
Progress Note   Patient: Natalie Brown UJW:[id number] DOB: [birth year] DOA: 12/24/2021     359 DOS: the patient was seen and examined on 12/20/2022      Subjective information: Patient still nonverbal agitation improved No acute overnight events Patient's mother was present at bedside and case was discussed with her  Brief hospital course: Natalie Brown is a 57 y.o. 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    Patient was initially admitted to [facility] last May 2023 with agitation and aggressive behaviors.  This was treated initially as manic Bipolar, but after several months in [facility] it became clear that she had significant cognitive impairment, and dementia was suspected.  Patient was observed to have significant short term memory impairment and worsening hygiene, MOCA 11/30 and CT head revealed advanced cerebral atrophy.     In early September 2023, patient was transferred for further evaluation and treatment due to concerns of new metabolic encephalopathy (that appear to have developed acutely over ~1 day).  Neurology and Psychiatry were consulted.  EEG was unremarkable.  Ammonia, TSH and RPR were normal.  MRI brain revealed no acute findings, but confirmed advanced atrophy.  Neurology suspected a particularly aggressive premature dementia.     While early notes implied that patient was forgetful but redirectable and oriented, she is now abulic and essentially nonverbal (nursing note occasional staff or some family members can elicit more responses, but they are very limited).  Patient has progressed beyond the ability to bathe or toilet independently.  Patient holds food in the mouth, and on recent calorie count (7/26-7/29) was observed to eat 50% of meals or less.  Nursing report she does not swallow her pills regularly due to pocketing of pills or holding pills in the mouth for long periods.  In the  last 6 months, she has had 22 kg weight loss and has become noticeably weaker and more prone to falls when attempting to walk (which she cannot do safely without assistance).   Palliative have been consulted and have met with family. Family are aware her dementia and weight loss are terminal.        Assessment and Plan: Early onset dementia  Bipolar Disorder Ammonia, TSH, RPR normal.  EEG normal.  MRI brain shows advanced atrophy.     She has been evaluated by Neurology and Psychiatry and we agree with Neurology that her condition was clearly irreversible at the time of first presentation and work up for autoimmune, paraneoplastic or prion diseases would have been futile.   Mother and sister aware of terminal condition.   She was weaned off of Haldol over the last 4 months with no discernible change in behavior. - Full supportive care - Palliative care on board we appreciate input     Severe protein calorie malnutrition Weight loss is down another 4 kg from last month.  I suspect this will be her terminal condition soon. - Continue nutrition supplements, MVI as much as able - Appreciate Palliative Care and dietitian consultations  - Check BMP only for change in mentation   - In light of the high degree of certainty that her medical condition is progressive, irreversible and terminal, it is my medical opinion that tube feeds and CPR are futile and should not be offered.  Patient pulls out all IV lines, and there is a high degree of certainty that IV fluids would be difficult and futile as  well.   Vitamin B12 Deficiency:   - Continue cyanocobalamin   Essential hypertension  BP normalized off medication   CKD Stage IIIa Cr stable   Physical Exam: Patient does not follow commands appears catatonic trying to get out of bed Seen having no purposeful movements Respiratory system: Breath sounds normal not labored Abdomen: Does not elicit any tenderness     Disposition: Status is:  Inpatient Patient was transferred for evaluation of worsening dementia.   This is now severe and end stage.    We recommend palliative care.  Family are supportive of patient but unable to care for her at home.  TOC are working towards long term placement, likely in the coming weeks   Data Reviewed: I have reviewed nursing documentation as well as transition of care manager documentation       Vitals:   12/18/22 2210 12/19/22 1054 12/20/22 0016 12/20/22 0620  BP: 126/75 106/67 93/69 105/65  Pulse: 89 84 (!) 55 60  Resp:    18  Temp: 98.3 F (36.8 C) 98 F (36.7 C)  97.6 F (36.4 C)  TempSrc: Oral   Axillary  SpO2: 100% 100% 100% 97%  Weight:      Height:        Author: Loyce Dys, MD 12/20/2022 3:00 PM  For on call review [email or web address].

## 2022-12-20 NOTE — Plan of Care (Signed)
  Problem: Education: Goal: Knowledge of General Education information will improve Description: Including pain rating scale, medication(s)/side effects and non-pharmacologic comfort measures Outcome: Not Progressing   Problem: Health Behavior/Discharge Planning: Goal: Ability to manage health-related needs will improve Outcome: Not Progressing   Problem: Clinical Measurements: Goal: Ability to maintain clinical measurements within normal limits will improve Outcome: Not Progressing Goal: Will remain free from infection Outcome: Not Progressing Goal: Diagnostic test results will improve Outcome: Not Progressing Goal: Respiratory complications will improve Outcome: Not Progressing Goal: Cardiovascular complication will be avoided Outcome: Not Progressing   Problem: Activity: Goal: Risk for activity intolerance will decrease Outcome: Not Progressing   Problem: Nutrition: Goal: Adequate nutrition will be maintained Outcome: Not Progressing   Problem: Coping: Goal: Level of anxiety will decrease Outcome: Not Progressing   Problem: Elimination: Goal: Will not experience complications related to bowel motility Outcome: Not Progressing Goal: Will not experience complications related to urinary retention Outcome: Not Progressing   Problem: Pain Managment: Goal: General experience of comfort will improve Outcome: Not Progressing   Problem: Safety: Goal: Ability to remain free from injury will improve Outcome: Not Progressing   Problem: Skin Integrity: Goal: Risk for impaired skin integrity will decrease Outcome: Not Progressing   Problem: Safety: Goal: Non-violent Restraint(s) Outcome: Not Progressing   

## 2022-12-21 NOTE — Plan of Care (Signed)

## 2022-12-21 NOTE — Progress Notes (Signed)
Progress Note   Patient: Natalie Brown [id number]:[id number] DOB: [birth year] DOA: 12/24/2021     360 DOS: the patient was seen and examined on 12/21/2022   Subjective information:  Agitated-responding to internal stimuli-nursing reports tried to get up when they were cleaning her and had to be placed in soft belt restraint I am unable to get any meaningful history out of her It appears she ate breakfast fairly well but has to be stimulated/let to eat  Brief hospital course: 57 y.o. 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    initially admitted to [facility] 08/2021 with agitation and aggressive behaviors.  Rx manic Bipolar, but after several months in [facility] noted significant cognitive impairment, and dementia was suspected.  Patient was observed to have significant short term memory impairment and worsening hygiene, MOCA 11/30 and CT head revealed advanced cerebral atrophy.     September 2023, patient was transferred for further evaluation and treatment due to concerns of new metabolic encephalopathy (that appear to have developed acutely over ~1 day).  Neurology and Psychiatry were consulted.  EEG was unremarkable.  Ammonia, TSH and RPR were normal.  MRI brain revealed no acute findings, but confirmed advanced atrophy.  Neurology suspected a particularly aggressive premature dementia.     Initially forgetful but redirectable and oriented, she is now abulic and essentially nonverbal (nursing note occasional staff or some family members can elicit more responses, but they are very limited).  Cannot bathe or toilet independently.  Patient holds food in the mouth, recent calorie count (7/26-7/29) was observed to eat 50% of meals or less.   Nursing report she does not swallow her pills regularly due to pocketing of pills or holding pills in the mouth for long periods.  In the last 6 months, she has had 22 kg weight loss  and has become noticeably weaker and more prone to falls when attempting to walk (which she cannot do safely without assistance).   Palliative have been consulted and have met with family. Family are aware her dementia and weight loss are terminal.        Assessment and Plan: Early onset dementia  Bipolar Disorder Ammonia, TSH, RPR normal.  EEG normal.  MRI brain shows advanced atrophy.     She has been evaluated by Neurology and Psychiatry her condition is clearly irreversible at the time of first presentation and work up for autoimmune, paraneoplastic or prion diseases would have been futile.   Mother and sister aware of terminal condition.   She was weaned off of Haldol over the last 4 months with no discernible change in behavior. - Full supportive care - Palliative care on board we appreciate input   Severe protein calorie malnutrition Weight loss is down another 4 kg from last month.  I suspect this will be her terminal condition soon. - Continue nutrition supplements, MVI as much as able - Appreciate Palliative Care and dietitian consultations  - Repeat Chem-12 in a.m.   - In light of the high degree of certainty that her medical condition is progressive, irreversible and terminal, it is my medical opinion that tube feeds and CPR are futile and should not be offered.  Patient pulls out all IV lines, and there is a high degree of certainty that IV fluids would be difficult and futile as well.   Vitamin B12 Deficiency:   - Continue cyanocobalamin   Essential hypertension  BP normalized  off medication   CKD Stage IIIa Cr stable   Physical Exam: Patient is agitated responding to internal stimuli-it is difficult to get an exam on her S1-S2 slight tachycardia Abdomen soft Rest of exam deferred   Disposition: Status is: Inpatient Patient was transferred for evaluation of worsening dementia.   This is now severe and end stage.    We recommend palliative care.  Family  are supportive of patient but unable to care for her at home.  TOC are working towards long term placement,-status unclear as to when   Data Reviewed: I have reviewed nursing documentation as well as transition of care manager documentation       Vitals:   12/20/22 0016 12/20/22 0620 12/21/22 0107 12/21/22 0808  BP: 93/69 105/65 96/73 (!) 149/127  Pulse: (!) 55 60 69 (!) 104  Resp:  18 18 20   Temp:  97.6 F (36.4 C) 98.3 F (36.8 C) 98.2 F (36.8 C)  TempSrc:  Axillary Axillary Oral  SpO2: 100% 97% 99%   Weight:      Height:        Author: Rhetta Mura, MD 12/21/2022 1:01 PM  For on call review [email or web address].

## 2022-12-21 NOTE — Plan of Care (Signed)
  Problem: Clinical Measurements: Goal: Ability to maintain clinical measurements within normal limits will improve Outcome: Progressing Goal: Will remain free from infection Outcome: Progressing Goal: Respiratory complications will improve Outcome: Progressing Goal: Cardiovascular complication will be avoided Outcome: Progressing   Problem: Activity: Goal: Risk for activity intolerance will decrease Outcome: Progressing   Problem: Coping: Goal: Level of anxiety will decrease Outcome: Progressing   Problem: Elimination: Goal: Will not experience complications related to bowel motility Outcome: Progressing Goal: Will not experience complications related to urinary retention Outcome: Progressing   Problem: Pain Managment: Goal: General experience of comfort will improve Outcome: Progressing   Problem: Safety: Goal: Ability to remain free from injury will improve Outcome: Progressing   Problem: Skin Integrity: Goal: Risk for impaired skin integrity will decrease Outcome: Progressing

## 2022-12-22 MED ORDER — OLANZAPINE 5 MG PO TBDP
2.5000 mg | ORAL_TABLET | Freq: Every day | ORAL | Status: DC
  Administered 2022-12-22 – 2022-12-25 (×4): 2.5 mg via ORAL
  Filled 2022-12-22 (×4): qty 1

## 2022-12-22 NOTE — Plan of Care (Signed)

## 2022-12-22 NOTE — Plan of Care (Signed)

## 2022-12-22 NOTE — Progress Notes (Signed)
Progress Note   Patient: Natalie Brown ION:[id number] DOB: 12/18/1965 DOA: 12/24/2021     361 DOS: the patient was seen and examined on 12/22/2022   Subjective information:  Continues to have rhythmic back-and-forth rocking motions in the bed-significant other [Boyfriend?] at bedside and wonders whether this is new as he is not seen at "this bad" she is in the process of eating dinner that he is feeding her and she stops momentarily her movements I have told him that I will speak to psychiatry about this  Brief hospital course: 57 y.o. 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    initially admitted to [facility] 08/2021 with agitation and aggressive behaviors.  Rx manic Bipolar, but after several months in [facility] noted significant cognitive impairment, and dementia was suspected.  Patient was observed to have significant short term memory impairment and worsening hygiene, MOCA 11/30 and CT head revealed advanced cerebral atrophy.     September 2023, patient was transferred for further evaluation and treatment due to concerns of new metabolic encephalopathy (that appear to have developed acutely over ~1 day).  Neurology and Psychiatry were consulted.  EEG was unremarkable.  Ammonia, TSH and RPR were normal.  MRI brain revealed no acute findings, but confirmed advanced atrophy.  Neurology suspected a particularly aggressive premature dementia.     Initially forgetful but redirectable and oriented, she is now abulic and essentially nonverbal (nursing note occasional staff or some family members can elicit more responses, but they are very limited).  Cannot bathe or toilet independently.  Patient holds food in the mouth, recent calorie count (7/26-7/29) was observed to eat 50% of meals or less.   Nursing report she does not swallow her pills regularly due to pocketing of pills or holding pills in the mouth for long  periods.  In the last 6 months, she has had 22 kg weight loss and has become noticeably weaker and more prone to falls when attempting to walk (which she cannot do safely without assistance).   Palliative have been consulted and have met with family. Family are aware her dementia and weight loss are terminal.        Assessment and Plan: Early onset dementia  Bipolar Disorder Ammonia, TSH, RPR normal.  EEG normal.  MRI brain shows advanced atrophy.     She has been evaluated by Neurology and Psychiatry her condition is clearly irreversible at the time of first presentation and work up for autoimmune, paraneoplastic or prion diseases would have been futile.   Mother and sister aware of terminal condition.   She was weaned off of Haldol over the last 4 months with no discernible change in behavior. - Full supportive care - Palliative care on board we appreciate input -Will start olanzapine 2.5 at bedtime at night until psychiatry can see as this seems to been used before-preference to avoid based on last psychiatry note 06/2022 Haldol and Geodon   Severe protein calorie malnutrition Weight loss is down another 4 kg from last month.  I suspect this will be her terminal condition soon. - Continue nutrition supplements, MVI as much as able - Appreciate Palliative Care and dietitian consultations  - Repeat Chem-12 in a.m.   - In light of the high degree of certainty that her medical condition is progressive, irreversible and terminal, it is my medical opinion that tube feeds and CPR are futile and should not be offered.  Patient pulls out all IV lines, and there is a high degree of certainty that IV fluids would be difficult and futile as well.   Vitamin B12 Deficiency:   - Continue cyanocobalamin   Essential hypertension  BP normalized off medication   CKD Stage IIIa Cr stable   Physical Exam:  Eating and drinking at the bedside Chest is clear Rest of exam deferred     Disposition: Status is: Inpatient Patient was transferred for evaluation of worsening dementia.   This is now severe and end stage.    We recommend palliative care.  Family are supportive of patient but unable to care for her at home.  TOC are working towards long term placement,-status unclear as to when   Data Reviewed: I have reviewed nursing documentation as well as transition of care manager documentation       Vitals:   12/21/22 0808 12/21/22 1611 12/22/22 0938 12/22/22 1607  BP: (!) 149/127  95/61 108/72  Pulse: (!) 104 (!) 101 87 72  Resp: 20 17 18 18   Temp: 98.2 F (36.8 C) 97.6 F (36.4 C)    TempSrc: Oral Oral    SpO2:  93% 100%   Weight:      Height:        Author: Rhetta Mura, MD 12/22/2022 5:38 PM  For on call review [email or web address].

## 2022-12-23 MED ORDER — LORAZEPAM 0.5 MG PO TABS
0.5000 mg | ORAL_TABLET | Freq: Two times a day (BID) | ORAL | Status: DC
  Administered 2022-12-23 – 2022-12-25 (×5): 0.5 mg via ORAL
  Filled 2022-12-23 (×5): qty 1

## 2022-12-23 NOTE — Consult Note (Signed)
[facility] Psychiatry Followup Face-to-Face Psychiatric Evaluation   Name: Natalie Brown DOB: [birth year] MRN: [id number] Service Date: 01-14-2023 LOS:  LOS: 362 days  Reason for Consult: "iMedication assistance-repetitive behaviors in the setting of severe catatonia previously-also somewhat agitated and requiring restraints-please assess and determine "  Referring Provider: Dr. Mahala Menghini   Assessment  Natalie Brown is a 57 y.o. female admitted medically for 12/24/2021 11:34 PM for possible catatonia/altered mental status. She carries the psychiatric diagnoses of bipolar affective disorder with psychotic symptoms and has a past medical history of hypertension.  This patient has had an unfortunate, rapid decline over the past several months. She was initially admitted to the behavioral health hospital after decompensating with hx bipolar I dosorder (was sleeping outside in the rain, confused, issues remaining on task) in 08/2021. Her psychosis and mania stabilized after about 4 weeks of admission w/ multiple medication trials (zyprexa, haldol, and invega). This admission revealed a significant and rapidly progressive dementia. She had a prolonged admission to behavioral health due to no safe discharge options and was transferred to the hospital in 12/2021 after 4 months after developing delirium/encephalopathy while admitted to [facility]. She has been admitted to [facility] since 12/2021 (now almost 1 year) and over that time has become almost totally nonverbal however has not been overtly manic or psychotic during that time span; dementia dx and sx has largely superceded these clinical features. Psychiatry consult team has been involved intermittently in her care over the last year (saw at admission 12/2021 when [facility] regimen continued, in 06/2022 (tapered off of haloperidol d/t rigidity/dystonia - attemtped to transition to paliperidone however mother/guardian did not pick up the phone)) and was re  consulted by Dr. Mahala Menghini due to repetitive behaviors (rocking back and forth). Her family is mostly concerned that these behaviors are preventing her from eating and will lead to hastened death.   01-14-2023: marked decline since I last saw in March. Rocking back and forth, no meaningful interaction. Mom consented to limited trial of ativan. Plan to reassess W 9/4 unless need for assessment tomorrow.   Diagnoses:  Active Hospital problems: Principal Problem:   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) Active Problems:   Bipolar affective disorder, current episode manic with psychotic symptoms (HCC)   Tobacco abuse   Delirium due to multiple etiologies, acute, hyperactive   Sinus tachycardia   Altered mental state   B12 deficiency   COVID-19 virus infection   Drooling   Essential hypertension   Protein-calorie malnutrition, severe     Plan  ## Safety and Observation Level:  - Based on my clinical evaluation, I estimate the patient to be at moderate risk of self harm in the current setting of safety unawareness; defer management to primary team (pt in lap belt at time of eval)   ## Medications:  -- OK to continue olanzapine 2.5 mg at bedtime started by primary team -- START lorazepam 0.5 mg BID  For PRNs, would go with low potency antipsychotics (ie olanzapine) given prior poor response to high potency/   -- prior propranalol d/c 03/2022, atropine dc 01/2022  ## Medical Decision Making Capacity:  Patient has interim legal guardian, mother Natalie Brown [phone])   ## Further Work-up:  -- most recent EKG on 11/22/2025  had QtC of 407 in setting of tachycardia   -- Pertinent labwork reviewed earlier this admission includes:  Labwork from last month reviewed w/ increasing creatinine.  CMP from today pending.   ##  Disposition:  --Recommend memory care unit if possible but defer to SW  ## Behavioral / Environmental:  --  delirium precautions     ##Legal Status Patient  with legal guardian  Thank you for this consult request. Recommendations have been communicated to the primary team.  We will continue to follow at this time.   [name] A [name]    NEW history  Relevant Aspects of Hospital Course:  Admitted on 12/24/2021 for acute encephalopathy; has underlying rapidly progressive and likely terminal dementia.   Patient Report:  Pt seen in AM. She does not interact meaningfully with psych team; spends visit rocking back and forth attempting to feed herself (in 5-10 min present, manages one bite). She does not make eye contact. When I attempted physical exam, she largely pulled away - was able to test for rigidity in 1/4 extremities only (LUE). She immediately tensed up, but appeared to have full ROM in 4/4 on visual exam. There are some catatonic features (namely the repetitive movements) although this is nearly impossible to fully evaluate in pt with advanced dementia.   Spoke to nurse - last time she heard her speak was about 3 weeks ago, was down to 1 word responses at that time  Spoke to mom - discussed r/b/se of lorazepam including fall risk vs potentially calming motor system down enough to allow her to eat. Agreed to limited trial and extend only for clinical benefit.   Psychiatric History:  Information collected from chart. Bipolar affective disorder with psychotic features, MNCD Admitted to [facility] since Sep 06, 2021  Family psych history: Patient reports mental illness runs in her father's side of the family.   Social History:  Tobacco use: Half PPD; no use since admission Alcohol use: No use since admission Drug use: Denies; UDS negative on admission in May  Family History:  No pertinent FHx  Medical History: Past Medical History:  Diagnosis Date   Bipolar 1 disorder with moderate mania (HCC) 12/30/2013   Hypertension     Surgical History: History reviewed. No pertinent surgical history.  Medications:   Current  Facility-Administered Medications:    (feeding supplement) PROSource Plus liquid 30 mL, 30 mL, Oral, BID BM, Danford, Earl Lites, MD, 30 mL at 12/22/22 1611   acetaminophen (TYLENOL) tablet 650 mg, 650 mg, Oral, Q6H PRN, Orland Mustard, MD, 650 mg at 12/17/22 2023   alum & mag hydroxide-simeth (MAALOX/MYLANTA) 200-200-20 MG/5ML suspension 30 mL, 30 mL, Oral, Q4H PRN, Orland Mustard, MD   bisacodyl (DULCOLAX) suppository 10 mg, 10 mg, Rectal, Daily PRN, Kc, Ramesh, MD   cyanocobalamin (VITAMIN B12) tablet 500 mcg, 500 mcg, Oral, Daily, Hazeline Junker B, MD, 500 mcg at 12/22/22 0937   enoxaparin (LOVENOX) injection 40 mg, 40 mg, Subcutaneous, Daily, Vann, Jessica U, DO, 40 mg at 12/22/22 0937   feeding supplement (ENSURE ENLIVE / ENSURE PLUS) liquid 237 mL, 237 mL, Oral, BID BM, Danford, Christopher P, MD, 237 mL at 12/22/22 1611   fluticasone (FLONASE) 50 MCG/ACT nasal spray 1 spray, 1 spray, Each Nare, Daily PRN, Orland Mustard, MD   hydrALAZINE (APRESOLINE) injection 10 mg, 10 mg, Intravenous, Q4H PRN, Amin, Ankit C, MD   hydrocerin (EUCERIN) cream, , Topical, BID, Orland Mustard, MD, Given at 12/22/22 [phone]   ipratropium-albuterol (DUONEB) 0.5-2.5 (3) MG/3ML nebulizer solution 3 mL, 3 mL, Nebulization, Q4H PRN, Amin, Ankit C, MD   lactulose (CHRONULAC) 10 GM/15ML solution 20 g, 20 g, Oral, BID PRN, Alanda Slim, Boyce Medici, MD   LORazepam (ATIVAN)  tablet 0.5 mg, 0.5 mg, Oral, BID, Twania Bujak A   multivitamin with minerals tablet 1 tablet, 1 tablet, Oral, Daily, Amin, Ankit C, MD, 1 tablet at 12/22/22 0936   OLANZapine zydis (ZYPREXA) disintegrating tablet 2.5 mg, 2.5 mg, Oral, QHS, Samtani, Jai-Gurmukh, MD, 2.5 mg at 12/22/22 2011   ondansetron (ZOFRAN) tablet 4 mg, 4 mg, Oral, Q8H PRN, Orland Mustard, MD, 4 mg at 03/17/22 0139   Oral care mouth rinse, 15 mL, Mouth Rinse, PRN, Leroy Sea, MD   polyethylene glycol (MIRALAX / GLYCOLAX) packet 17 g, 17 g, Oral, Daily, Pokhrel, Laxman, MD, 17 g  at 12/22/22 0936   senna-docusate (Senokot-S) tablet 2 tablet, 2 tablet, Oral, QHS, Gherghe, Daylene Katayama, MD, 2 tablet at 12/20/22 2104   Tdap (BOOSTRIX) injection 0.5 mL, 0.5 mL, Intramuscular, Once, Marlin Canary U, DO  Allergies: Allergies  Allergen Reactions   Penicillins Itching       Objective  Vital signs:  Temp:  [97.9 F (36.6 C)] 97.9 F (36.6 C) (09/02 0710) Pulse Rate:  [52-72] 52 (09/02 0710) Resp:  [18] 18 (09/02 0710) BP: (84-108)/(56-72) 98/62 (09/02 0710) SpO2:  [100 %] 100 % (09/02 0116)  Psychiatric Specialty Exam: Lyvonne Niswonger a 57 year old AA female, unkempt when seen in hospital bed with lap restraint present. She appeared slightly younger than his stated age and has lost a significant amount of weight since last seen by this author. During the interview, she was and restless, rocking back and forth continually and struggling to feed herself. She did not interact meaningfully with the examiner through interview and was nonverbal; could not fully assess attention and orientation. Did not follow simple commands. She did not appear to be responding to internal stimuli.    Put in paragraph    Physical Exam: Physical Exam Vitals reviewed.  Constitutional:      General: [name] not in acute distress.    Appearance: [name] not toxic-appearing.  HENT:     Head: Normocephalic and atraumatic.  Pulmonary:     Effort: Pulmonary effort is normal.  Skin:    General: Skin is warm and dry.  Neurological:     General: No focal deficit present.     Mental Status: [name] alert.     Blood pressure 98/62, pulse (!) 52, temperature 97.9 F (36.6 C), resp. rate 18, height 5\' 6"  (1.676 m), weight 45.8 kg, SpO2 100%. Body mass index is 16.3 kg/m.

## 2022-12-23 NOTE — Progress Notes (Signed)
Progress Note   Patient: Natalie Brown:[id number] DOB: 1965/10/31 DOA: 12/24/2021     362 DOS: the patient was seen and examined on 12/23/2022   Subjective information:  Continues to have back-and-forth rhythmic motions but much less since initiation of Ativan as per psychiatry this morning-appreciate their input Lunch remains untouched, tech reports ate 100% of breakfast  Brief hospital course: 57 y.o. 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    initially admitted to [facility] 08/2021 with agitation and aggressive behaviors.  Rx manic Bipolar, but after several months in [facility] noted significant cognitive impairment, and dementia was suspected.  Patient was observed to have significant short term memory impairment and worsening hygiene, MOCA 11/30 and CT head revealed advanced cerebral atrophy.     September 2023, patient was transferred for further evaluation and treatment due to concerns of new metabolic encephalopathy (that appear to have developed acutely over ~1 day).  Neurology and Psychiatry were consulted.  EEG was unremarkable.  Ammonia, TSH and RPR were normal.  MRI brain revealed no acute findings, but confirmed advanced atrophy.  Neurology suspected a particularly aggressive premature dementia.     Initially forgetful but redirectable and oriented, she is now abulic and essentially nonverbal (nursing note occasional staff or some family members can elicit more responses, but they are very limited).  Cannot bathe or toilet independently.  Patient holds food in the mouth, recent calorie count (7/26-7/29) was observed to eat 50% of meals or less.   Nursing report she does not swallow her pills regularly due to pocketing of pills or holding pills in the mouth for long periods.  In the last 6 months, she has had 22 kg weight loss and has become noticeably weaker and more prone to falls when  attempting to walk (which she cannot do safely without assistance).   Palliative have been consulted and have met with family. Family are aware her dementia and weight loss are terminal.    Did call the patient's mother [name] [phone]   Assessment and Plan:  Early onset dementia  Bipolar Disorder Ammonia, TSH, RPR normal.  EEG normal.  MRI brain shows advanced atrophy.   Neurology/psychiatry feel condition clearly irreversible at the time of first presentation and work up for autoimmune, paraneoplastic or prion diseases would have been futile. Mother and sister aware of terminal condition. weaned off of Haldol over the last 4 months with no discernible change in behavior. - Full supportive care - Palliative care has seen 11/18/22--I will ask them to revisit and discuss later this week--I did clearly elicit from the Mother [name] that we are seeing adult failure to thrive, and that she is clearly Hospice ELigible.  Mother will think over this but seems resistant to the idea. - cont olanzapine 2.5 at bedtime at night  - Psychiatry added 0.5 lorazepam twice daily which seems to have calmed her automatic movements down     Severe protein calorie malnutrition Patient has lost close to 60 pounds since October of last year and continues to lose weight - Continue nutrition supplements, MVI as much as able - Appreciate Palliative Care and dietitian consultations  - Labs were not drawn despite me ordering them   - Medical condition is progressive, irreversible and terminal, it is my medical opinion that tube feeds and CPR are futile and should not be offered.  Patient pulls out all IV lines, and there  is a high degree of certainty that IV fluids would be difficult and futile as well.   Vitamin B12 Deficiency:   - Continue cyanocobalamin   Essential hypertension  BP normalized off medication   CKD Stage IIIa Cr stable   Physical Exam: BP 98/62 (BP Location: Left Arm)    Pulse (!) 52   Temp 97.9 F (36.6 C)   Resp 18   Ht 5\' 6"  (1.676 m)   Wt 45.8 kg   SpO2 100%   BMI 16.30 kg/m  Responding to internal stimuli Closes eyes when I attempt to examine Chest is clear no rales rhonchi Abdomen is soft Refuses to cooperate with exam    Disposition: Status is: Inpatient Patient was transferred for evaluation of worsening dementia.   This is now severe and end stage.    We recommend palliative care.  OC are working towards long term placement,-status unclear as to when [employer] care to re-visit   Data Reviewed: I have reviewed nursing documentation as well as transition of care manager documentation     Vitals:   12/22/22 0938 12/22/22 1607 12/23/22 0116 12/23/22 0710  BP: 95/61 108/72 (!) 84/56 98/62  Pulse: 87 72 (!) 52 (!) 52  Resp: 18 18 18 18   Temp:    97.9 F (36.6 C)  TempSrc:      SpO2: 100%  100%   Weight:      Height:        Author: Rhetta Mura, MD 12/23/2022 3:05 PM  For on call review [email or web address].

## 2022-12-24 LAB — COMPREHENSIVE METABOLIC PANEL
ALT: 17 U/L (ref 0–44)
AST: 30 U/L (ref 15–41)
Albumin: 3.2 g/dL — ABNORMAL LOW (ref 3.5–5.0)
Alkaline Phosphatase: 54 U/L (ref 38–126)
Anion gap: 10 (ref 5–15)
BUN: 18 mg/dL (ref 6–20)
CO2: 25 mmol/L (ref 22–32)
Calcium: 9.2 mg/dL (ref 8.9–10.3)
Chloride: 105 mmol/L (ref 98–111)
Creatinine, Ser: 1.02 mg/dL — ABNORMAL HIGH (ref 0.44–1.00)
GFR, Estimated: >60 mL/min (ref >60)
Glucose, Bld: 63 mg/dL — ABNORMAL LOW (ref 70–99)
Potassium: 5.2 mmol/L — ABNORMAL HIGH (ref 3.5–5.1)
Sodium: 140 mmol/L (ref 135–145)
Total Bilirubin: 1 mg/dL (ref 0.3–1.2)
Total Protein: 6.2 g/dL — ABNORMAL LOW (ref 6.5–8.1)

## 2022-12-24 NOTE — TOC Progression Note (Signed)
Transition of Care [facility]) - Progression Note    Patient Details  Name: Natalie Brown MRN: [id number] Date of Birth: 06/27/1965  Transition of Care [facility]) CM/SW Contact  Inis Sizer, LCSW Phone Number: 12/24/2022, 1:05 PM  Clinical Narrative:    CSW spoke with Whitney of central intake to request she review patient for possible placement.  CSW reached out to [place] [facility] [place] to determine if a bed offer can be made to patient - CSW waiting for response.     Barriers to Discharge: Financial Resources, Inadequate or no insurance, Homeless with medical needs  Expected Discharge Plan and Services In-house Referral: Clinical Social Work   Post Acute Care Choice: Nursing Home Living arrangements for the past 2 months: Homeless [facility]) Expected Discharge Date: 12/24/21                                     Social Determinants of Health (SDOH) Interventions SDOH Screenings   Food Insecurity: No Food Insecurity (03/05/2022)  Housing: High Risk (03/21/2022)  Alcohol Screen: Low Risk  (09/06/2021)  Depression (PHQ2-9): Medium Risk (05/26/2020)  Tobacco Use: High Risk (03/21/2022)    Readmission Risk Interventions     No data to display

## 2022-12-24 NOTE — Progress Notes (Signed)
Pt heard saying some words tonight most of them unclear.  She said "chicken and waffles" Nurse asked who you talking to [place] she said "that guy"  She was able to brushed her teeth this AM as well.

## 2022-12-24 NOTE — Progress Notes (Signed)
Pt having small conversation with RN and NT.  RN asked if she wanted to talk to [place]. Pt stated yes I want to talk to [place]. Then RN ask if she wanted to talk to her mother and handed the room phone to her. Pt said "Hello". RN then dialed Pt's mother phone #. Then had a conversation. Pt stated "I'm ok, I love you too.

## 2022-12-24 NOTE — Progress Notes (Signed)
But Progress Note   Patient: Natalie Brown JXB:[id number] DOB: [birth year] DOA: 12/24/2021     363 DOS: the patient was seen and examined on 12/24/2022   Subjective information:  Looks better [employer] with nursing today! Seeing some improvements  Brief hospital course: 57 y.o. 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    initially admitted to [facility] 08/2021 with agitation and aggressive behaviors.  Rx manic Bipolar, but after several months in [facility] noted significant cognitive impairment, and dementia was suspected.  Patient was observed to have significant short term memory impairment and worsening hygiene, MOCA 11/30 and CT head revealed advanced cerebral atrophy.     September 2023, patient was transferred for further evaluation and treatment due to concerns of new metabolic encephalopathy (that appear to have developed acutely over ~1 day).  Neurology and Psychiatry were consulted.  EEG was unremarkable.  Ammonia, TSH and RPR were normal.  MRI brain revealed no acute findings, but confirmed advanced atrophy.  Neurology suspected a particularly aggressive premature dementia.     Initially forgetful but redirectable and oriented, she is now abulic and essentially nonverbal (nursing note occasional staff or some family members can elicit more responses, but they are very limited).  Cannot bathe or toilet independently.  Patient holds food in the mouth, recent calorie count (7/26-7/29) was observed to eat 50% of meals or less.   Nursing report she does not swallow her pills regularly due to pocketing of pills or holding pills in the mouth for long periods.  In the last 6 months, she has had 22 kg weight loss and has become noticeably weaker and more prone to falls when attempting to walk (which she cannot do safely without assistance).   Palliative have been consulted and have met with family. Family are aware  her dementia and weight loss are terminal.    Did call the patient's mother [name] [phone] 9/2/ and extnesively updated   Assessment and Plan:  Early onset dementia  Bipolar Disorder Ammonia, TSH, RPR normal.  EEG normal.  MRI brain shows advanced atrophy.   Neurology/psychiatry feel condition clearly irreversible at the time of first presentation and work up for autoimmune, paraneoplastic or prion diseases would have been futile. Mother and sister aware of terminal condition. weaned off of Haldol over the last 4 months with no discernible change in behavior. - Full supportive care - Palliative care has seen 11/18/22--they are aware --I did clearly elicit from the Mother [name] that we are seeing adult failure to thrive, and that she is clearly Hospice ELigible.  Mother will think over this but seems resistant to the idea. - cont olanzapine 2.5 at bedtime at night  - Psychiatry added 0.5 lorazepam twice daily -she seems to be much better with this and psych will augment as needed--    Severe protein calorie malnutrition has lost close to 60 pounds since October of last year and continues to lose weight - Continue nutrition supplements, MVI as much as able - Appreciate Palliative Care and dietitian consultations  - Labs were not drawn despite me ordering them - Medical condition is progressive, irreversible and terminal, it is my medical opinion that tube feeds and CPR are futile and should not be offered.  Patient pulls out all IV lines, and there is a high degree of certainty that IV fluids would be difficult and futile as well.   Vitamin B12 Deficiency:   -  Continue cyanocobalamin   Essential hypertension  BP normalized off medication   CKD Stage IIIa Cr stable   Physical Exam: BP (!) 138/92 (BP Location: Left Arm)   Pulse 78   Temp 97.6 F (36.4 C) (Oral)   Resp 18   Ht 5\' 6"  (1.676 m)   Wt 45.8 kg   SpO2 92%   BMI 16.30 kg/m   Responding to internal  stimuli but seems to have less automatic movements and is more redirectable-did not speak with me today but seems to respond nonverbally to my voice and look at my direction Actively trying to eat food at the bedside Chest is clear Slightly tachycardic    Disposition: Status is: Inpatient Patient was transferred for evaluation of worsening dementia.   We recommend palliative care.  OC are working towards long term placement,-status unclear as to when [employer] care to re-visit   Data Reviewed: I have reviewed nursing documentation as well as transition of care manager documentation     Vitals:   12/23/22 1748 12/23/22 2033 12/23/22 2300 12/24/22 0601  BP: 101/77 93/64  (!) 138/92  Pulse: 78 66  78  Resp: 18     Temp:    97.6 F (36.4 C)  TempSrc:    Oral  SpO2: 100% (!) 89% 100% 92%  Weight:      Height:        Author: Rhetta Mura, MD 12/24/2022 3:19 PM  For on call review [email or web address].

## 2022-12-24 NOTE — Consult Note (Signed)
[facility] Psychiatry Followup Face-to-Face Psychiatric Evaluation   Name: Natalie Brown DOB: 07-29-1965 MRN: [id number] Service Date: December 24, 2022 LOS:  LOS: 363 days  Reason for Consult: "iMedication assistance-repetitive behaviors in the setting of severe catatonia previously-also somewhat agitated and requiring restraints-please assess and determine "  Referring Provider: Dr. Mahala Menghini   Assessment  Natalie Brown is a 57 y.o. female admitted medically for 12/24/2021 11:34 PM for possible catatonia/altered mental status. She carries the psychiatric diagnoses of bipolar affective disorder with psychotic symptoms and has a past medical history of hypertension.  This patient has had an unfortunate, rapid decline over the past several months. She was initially admitted to the behavioral health hospital after decompensating with hx bipolar I dosorder (was sleeping outside in the rain, confused, issues remaining on task) in 08/2021. Her psychosis and mania stabilized after about 4 weeks of admission w/ multiple medication trials (zyprexa, haldol, and invega). This admission revealed a significant and rapidly progressive dementia. She had a prolonged admission to behavioral health due to no safe discharge options and was transferred to the hospital in 12/2021 after 4 months after developing delirium/encephalopathy while admitted to [facility]. She has been admitted to [facility] since 12/2021 (now almost 1 year) and over that time has become almost totally nonverbal however has not been overtly manic or psychotic during that time span; dementia dx and sx has largely superceded these clinical features. Psychiatry consult team has been involved intermittently in her care over the last year (saw at admission 12/2021 when [facility] regimen continued, in 06/2022 (tapered off of haloperidol d/t rigidity/dystonia - attemtped to transition to paliperidone however mother/guardian did not pick up the phone)) and was re  consulted by Dr. Mahala Menghini due to repetitive behaviors (rocking back and forth). Her family is mostly concerned that these behaviors are preventing her from eating and will lead to hastened death.   Dec 30, 2022: marked decline since I last saw in March. Rocking back and forth, no meaningful interaction. Mom consented to limited trial of ativan. Plan to reassess W 9/4 unless need for assessment tomorrow.   9/3: large interval improvement - pt interacting meaningfully with world around her. Have asked mom to keep an eye on if rocking/withdrawal worsens in late afternoon (will try to swing by) and might add on 12 noon dose of ativan if behaviors recur around expected trough.   Diagnoses:  Active Hospital problems: Principal Problem:   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) Active Problems:   Bipolar affective disorder, current episode manic with psychotic symptoms (HCC)   Tobacco abuse   Delirium due to multiple etiologies, acute, hyperactive   Sinus tachycardia   Altered mental state   B12 deficiency   COVID-19 virus infection   Drooling   Essential hypertension   Protein-calorie malnutrition, severe     Plan  ## Safety and Observation Level:  - Based on my clinical evaluation, I estimate the patient to be at moderate risk of self harm in the current setting of safety unawareness; defer management to primary team (pt in lap belt at time of eval)   ## Medications:  -- OK to continue olanzapine 2.5 mg at bedtime started by primary team -- CONTINUE lorazepam 0.5 mg BID  For PRNs, would go with low potency antipsychotics (ie olanzapine) given prior poor response to high potency   -- prior propranalol d/c 03/2022, atropine dc 01/2022  ## Medical Decision Making Capacity:  Patient has interim legal guardian, mother [name] [phone]);  clearly lacks capacity for most medical decisions.   ## Further Work-up:  -- most recent EKG on 11/22/2025  had QtC of 407 in setting of  tachycardia   -- Pertinent labwork reviewed earlier this admission includes:  Labwork from last month reviewed w/ increasing creatinine.  CMP pending  ## Disposition:  --Recommend memory care unit if possible but defer to SW  ## Behavioral / Environmental:  --  delirium precautions     ##Legal Status Patient with legal guardian  Thank you for this consult request. Recommendations have been communicated to the primary team.  We will continue to follow at this time.   Natalie Brown [name]    NEW history  Relevant Aspects of Hospital Course:  Admitted on 12/24/2021 for acute encephalopathy; has underlying rapidly progressive and likely terminal dementia.   Patient Report:  Pt seen in AM. She tracks this author with eyes and there is no rocking behavior observed. She appears to be watching TV. Says something (difficult to understand) while I am in the room. Mom at bedside says she has seen significant improvement (see nursing notes below) - went over rationale for current treatment plan (low dose, slow increase) and r/b/se. Discussed potentially adding on 12 dose if rocking/withdrawal recurs in late afternoon. She allowed me to perform a physical exam on her -  . Other Catatonic features mentioned yesterday have largely improved - speaking, eating, not rocking, no withdrawal - difficult to tell what is catatonia and what is underlying dementia.   Spoke to nurse - last time she heard her speak was about 3 weeks ago, was down to 1 word responses at that time  Spoke to mom - discussed r/b/se of lorazepam including fall risk vs potentially calming motor system down enough to allow her to eat. Agreed to limited trial and extend only for clinical benefit.   Psychiatric History:  Information collected from chart. Bipolar affective disorder with psychotic features, MNCD Admitted to [facility] since Sep 06, 2021  Family psych history: Patient reports mental illness runs in her father's side of the  family.   Social History:  Tobacco use: Half PPD; no use since admission Alcohol use: No use since admission Drug use: Denies; UDS negative on admission in May  Family History:  No pertinent FHx  Medical History: Past Medical History:  Diagnosis Date   Bipolar 1 disorder with moderate mania (HCC) 12/30/2013   Hypertension     Surgical History: History reviewed. No pertinent surgical history.  Medications:   Current Facility-Administered Medications:    (feeding supplement) PROSource Plus liquid 30 mL, 30 mL, Oral, BID BM, Danford, Earl Lites, MD, 30 mL at 12/24/22 0834   acetaminophen (TYLENOL) tablet 650 mg, 650 mg, Oral, Q6H PRN, Orland Mustard, MD, 650 mg at 12/17/22 2023   alum & mag hydroxide-simeth (MAALOX/MYLANTA) 200-200-20 MG/5ML suspension 30 mL, 30 mL, Oral, Q4H PRN, Orland Mustard, MD   bisacodyl (DULCOLAX) suppository 10 mg, 10 mg, Rectal, Daily PRN, Kc, Ramesh, MD   cyanocobalamin (VITAMIN B12) tablet 500 mcg, 500 mcg, Oral, Daily, Hazeline Junker B, MD, 500 mcg at 12/24/22 0833   enoxaparin (LOVENOX) injection 40 mg, 40 mg, Subcutaneous, Daily, Vann, Jessica U, DO, 40 mg at 12/24/22 0834   feeding supplement (ENSURE ENLIVE / ENSURE PLUS) liquid 237 mL, 237 mL, Oral, BID BM, Danford, Christopher P, MD, 237 mL at 12/24/22 0839   fluticasone (FLONASE) 50 MCG/ACT nasal spray 1 spray, 1 spray, Each Nare, Daily PRN, Orland Mustard, MD  hydrALAZINE (APRESOLINE) injection 10 mg, 10 mg, Intravenous, Q4H PRN, Amin, Ankit C, MD   hydrocerin (EUCERIN) cream, , Topical, BID, Orland Mustard, MD, Given at 12/24/22 [phone]   ipratropium-albuterol (DUONEB) 0.5-2.5 (3) MG/3ML nebulizer solution 3 mL, 3 mL, Nebulization, Q4H PRN, Amin, Ankit C, MD   lactulose (CHRONULAC) 10 GM/15ML solution 20 g, 20 g, Oral, BID PRN, Alanda Slim, Taye T, MD   LORazepam (ATIVAN) tablet 0.5 mg, 0.5 mg, Oral, BID, Stachia Slutsky A, 0.5 mg at 12/24/22 7829   multivitamin with minerals tablet 1 tablet, 1  tablet, Oral, Daily, Amin, Ankit C, MD, 1 tablet at 12/24/22 0834   OLANZapine zydis (ZYPREXA) disintegrating tablet 2.5 mg, 2.5 mg, Oral, QHS, Samtani, Jai-Gurmukh, MD, 2.5 mg at 12/23/22 2147   ondansetron (ZOFRAN) tablet 4 mg, 4 mg, Oral, Q8H PRN, Orland Mustard, MD, 4 mg at 03/17/22 0139   Oral care mouth rinse, 15 mL, Mouth Rinse, PRN, Leroy Sea, MD   polyethylene glycol (MIRALAX / GLYCOLAX) packet 17 g, 17 g, Oral, Daily, Pokhrel, Laxman, MD, 17 g at 12/24/22 0834   senna-docusate (Senokot-S) tablet 2 tablet, 2 tablet, Oral, QHS, Gherghe, Daylene Katayama, MD, 2 tablet at 12/23/22 2147   Tdap (BOOSTRIX) injection 0.5 mL, 0.5 mL, Intramuscular, Once, Vann, Jessica U, DO  Allergies: Allergies  Allergen Reactions   Penicillins Itching       Objective  Vital signs:  Temp:  [97.6 F (36.4 C)] 97.6 F (36.4 C) (09/03 0601) Pulse Rate:  [66-78] 78 (09/03 0601) Resp:  [18] 18 (09/02 1748) BP: (93-138)/(64-92) 138/92 (09/03 0601) SpO2:  [89 %-100 %] 92 % (09/03 0601)  Psychiatric Specialty Exam: Ruhi Bolz a 57 year old AA female, unkempt when seen in hospital bed with lap restraint present. She appeared slightly younger than his stated age and appears malnourished. During the interview, she was not observed to be restless or rocking back and forth. She tracked the examiner with her eyes and attempted to follow some commands (shaking hand). She appeared to become anxious during physical exam, retracting her limbs, but otherwise appeared calm. She did not state a mood; impossible to judge insight/judgment/attention/orientation but assume poor. She did not appear to be responding ot internal stimuli.    Physical Exam: Physical Exam Vitals reviewed.  Constitutional:      General: [name] not in acute distress.    Appearance: [name] not toxic-appearing.  HENT:     Head: Normocephalic and atraumatic.  Pulmonary:     Effort: Pulmonary effort is normal.  Skin:    General: Skin is  warm and dry.  Neurological:     General: No focal deficit present.     Mental Status: [name] alert.     Blood pressure (!) 138/92, pulse 78, temperature 97.6 F (36.4 C), temperature source Oral, resp. rate 18, height 5\' 6"  (1.676 m), weight 45.8 kg, SpO2 92%. Body mass index is 16.3 kg/m.

## 2022-12-25 MED ORDER — LORAZEPAM 2 MG/ML PO CONC
0.5000 mg | Freq: Three times a day (TID) | ORAL | Status: DC
  Administered 2022-12-26 (×2): 0.5 mg via ORAL
  Filled 2022-12-25 (×2): qty 1

## 2022-12-25 NOTE — Progress Notes (Signed)
Progress Note   Patient: Natalie Brown YQM:[id number] DOB: 21-May-1965 DOA: 12/24/2021     364 DOS: the patient was seen and examined on 12/25/2022   Brief hospital course: Mrs. Foxx is a 57 y.o. 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    Patient was initially admitted to [facility] last May 2023 with agitation and aggressive behaviors.  This was treated initially as manic Bipolar, but after several months in [facility] it became clear that she had significant cognitive impairment, and dementia was suspected.  Patient was observed to have significant short term memory impairment and worsening hygiene, MOCA 11/30 and CT head revealed advanced cerebral atrophy.     In early September 2023, patient was transferred for further evaluation and treatment due to concerns of new metabolic encephalopathy (that appear to have developed acutely over ~1 day).  Neurology and Psychiatry were consulted.  EEG was unremarkable.  Ammonia, TSH and RPR were normal.  MRI brain revealed no acute findings, but confirmed advanced atrophy.  Neurology suspected a particularly aggressive premature dementia.     While early notes implied that patient was forgetful but redirectable and oriented, she is now abulic and essentially nonverbal (nursing note occasional staff or some family members can elicit more responses, but they are very limited).  Patient has progressed beyond the ability to bathe or toilet independently.  Patient holds food in the mouth, and on recent calorie count (7/26-7/29) was observed to eat 50% of meals or less.  Nursing report she does not swallow her pills regularly due to pocketing of pills or holding pills in the mouth for long periods.  In the last 6 months, she has had 22 kg weight loss and has become noticeably weaker and more prone to falls when attempting to walk (which she cannot do safely without assistance).    Palliative have been consulted and have met with family. Family are aware her dementia and weight loss are terminal.        Assessment and Plan: Early onset dementia  Bipolar Disorder Ammonia, TSH, RPR normal.  EEG normal.  MRI brain shows advanced atrophy.   Neurology/psychiatry feel condition clearly irreversible at the time of first presentation and work up for autoimmune, paraneoplastic or prion diseases would have been futile. Mother and sister aware of terminal condition. weaned off of Haldol over the last 4 months with no discernible change in behavior. - Full supportive care - Palliative care has seen 11/18/22--they are aware --I did clearly elicit from the Mother [name] that we are seeing adult failure to thrive, and that she is clearly Hospice ELigible.  Mother will think over this but seems resistant to the idea. - cont olanzapine 2.5 at bedtime at night  - Psychiatry added 0.5 lorazepam twice daily -seemed more alert and conversant initially. Discussed with Psychiatry who recommends continued trial of ativan liquid if possible     Severe protein calorie malnutrition has lost close to 60 pounds since October of last year and continues to lose weight - Continue nutrition supplements, MVI as much as able - Appreciate Palliative Care and dietitian consultations  - Labs were not drawn despite me ordering them - Medical condition is progressive, irreversible and terminal, it is my medical opinion that tube feeds and CPR are futile and should not be offered.  Patient pulls out all IV lines, and there is a high degree of certainty that IV fluids  would be difficult and futile as well.   Vitamin B12 Deficiency:   - Continue cyanocobalamin   Essential hypertension  BP normalized off medication   CKD Stage IIIa Cr stable   Physical Exam: BP (!) 138/92 (BP Location: Left Arm)   Pulse 78   Temp 97.6 F (36.4 C) (Oral)   Resp 18   Ht 5\' 6"  (1.676 m)   Wt 45.8 kg   SpO2  92%   BMI 16.30 kg/m         Subjective: Unable to assess given mentation  Physical Exam: Vitals:   12/24/22 0601 12/24/22 2200 12/25/22 0524 12/25/22 1444  BP: (!) 138/92 111/68 94/61 105/67  Pulse: 78 66 (!) 52 66  Resp:  16 18   Temp: 97.6 F (36.4 C) 98.5 F (36.9 C) 97.8 F (36.6 C) (!) 97.3 F (36.3 C)  TempSrc: Oral Oral Oral Oral  SpO2: 92% 100% 98% 100%  Weight:      Height:      General exam: Asleep, difficult to arouse, laying in bed, in nad Respiratory system: Normal respiratory effort, no wheezing Cardiovascular system: regular rate, s1, s2 Gastrointestinal system: Soft, nondistended, positive BS Central nervous system: CN2-12 grossly intact, strength intact Extremities: Perfused, no clubbing Skin: Normal skin turgor, no notable skin lesions seen Psychiatry: Unable to assess given mentation   Data Reviewed:  There are no new results to review at this time.  Family Communication: Pt in room, family not at bedside  Disposition: Status is: Inpatient Remains inpatient appropriate because: severity of illness  Planned Discharge Destination:  Unclear at this time     Author: Rickey Barbara, MD 12/25/2022 3:18 PM  For on call review [email or web address].

## 2022-12-25 NOTE — Progress Notes (Signed)
Patient ID: Natalie Brown, female   DOB: Apr 08, 1966, 57 y.o.   MRN: [id number]    Progress Note from the Palliative Medicine Team at [facility]   Patient Name: Natalie Brown        Date: 12/25/2022 DOB: June 17, 1965  Age: 57 y.o. MRN#: [id number] Attending Physician: Jerald Kief, MD Primary Care Physician: Lavinia Sharps, NP Admit Date: 12/24/2021    Extensive chart review has been completed prior to meeting with patient/family  including labs, vital signs, imaging, progress/consult notes, orders, medications and available advance directive documents.   57 y.o. female   admitted on 12/24/2021 medically for 12/26/2021  1:58 AM for possible catatonia/altered mental status. She carries the psychiatric diagnoses of bipolar affective disorder with psychotic symptoms and has a past medical history of hypertension.  I met with this family initially on July 29,2024   At that time patient's family reported dense psychiatric history going back 20 years.   Patient initially was admitted to behavioral health where she was initially admitted with mania, dementia diagnosis was prescribed at that time.  IMPRESSION:   MRI 12/24/2021 1. No acute intracranial abnormality. 2. Advanced cerebral and cerebellar atrophy.    Patient's alertness/'s cognitive state continues to  wax and wane day-to-day.    Patient continues to fail to thrive.  Overall decreased  oral intake, per dietitian note she has lost 15% of her body weight in the last 3 months.  Patient does not have medical decision-making capacity.   Today patient is nonverbal and unable to follow commands for me.  She appears comfortable.   No family at bedside  Transition of care continue to work on getting patient's Medicaid changed so she can be placed for long-term care in a SNF.   Family face ongoing  treatment option decisions, advanced directive decisions and anticipatory care needs.  Current plan of care -DNR DNI -No artificial  feeding or hydration now in the future -Diet as tolerated with assistance with feeding -Hopeful for placement for long-term care  PMT will continue to follow peripherally to assist with palliative medicine needs as they arise.  Time: 25 minutes  Detailed review of medical records , medically appropriate exam ( MS, skin, resp)   discussed with treatment team, counseling and education to staff, documenting clinical information, medication management, coordination of care    Lorinda Creed NP  Palliative Medicine Team Team Phone # [phone] Pager [phone]

## 2022-12-25 NOTE — Plan of Care (Signed)

## 2022-12-25 NOTE — Consult Note (Signed)
[facility] Psychiatry Followup Face-to-Face Psychiatric Evaluation   Name: Natalie Brown DOB: 28-Dec-1965 MRN: [id number] Service Date: December 25, 2022 LOS:  LOS: 364 days  Reason for Consult: "iMedication assistance-repetitive behaviors in the setting of severe catatonia previously-also somewhat agitated and requiring restraints-please assess and determine "  Referring Provider: Dr. Mahala Brown   Assessment  Natalie Brown is Natalie 57 y.o. female admitted medically for 12/24/2021 11:34 PM for possible catatonia/altered mental status. She carries the psychiatric diagnoses of bipolar affective disorder with psychotic symptoms and has Natalie past medical history of hypertension.  This patient has had an unfortunate, rapid decline over the past several months to Natalie year. She was initially admitted to the behavioral health hospital after decompensating with hx bipolar I dosorder (was sleeping outside in the rain, confused, issues remaining on task) in 08/2021. Her psychosis and mania stabilized after about 4 weeks of admission w/ multiple medication trials (zyprexa, haldol, and invega). This admission revealed Natalie significant and rapidly progressive dementia. She had Natalie prolonged admission to behavioral health due to no safe discharge options and was transferred to the hospital in 12/2021 after 4 months after developing delirium/encephalopathy while admitted to [facility]. She has been admitted to [facility] since 12/2021 (now almost 1 year) and over that time has become almost totally nonverbal however has not been overtly manic or psychotic during that time span; dementia dx and sx has largely superceded these clinical features. Psychiatry consult team has been involved intermittently in her care over the last year (saw at admission 12/2021 when [facility] regimen continued, in 06/2022 (tapered off of haloperidol d/t rigidity/dystonia - attemtped to transition to paliperidone however mother/guardian did not pick up the phone))  and was re consulted by Dr. Mahala Brown due to repetitive behaviors (rocking back and forth). Her family is mostly concerned that these behaviors are preventing her from eating and will lead to hastened death.   January 13, 2023: marked decline since I last saw in March. Rocking back and forth, no meaningful interaction. Mom consented to limited trial of ativan. Plan to reassess W 9/4 unless need for assessment tomorrow.   9/3: large interval improvement - pt interacting meaningfully with world around her. Have asked mom to keep an eye on if rocking/withdrawal worsens in late afternoon (will try to swing by) and might add on 12 noon dose of ativan if behaviors recur around expected trough.   9/4: seen in afternoon, again rocking back and forth - per mom also rocking yesterday afternoon. Nonverbal. Missed AM dose ativan (nurse couldn't push oral intake). Will bump ativan to TID and liquid formula to hopefully improve adherence.   Diagnoses:  Active Hospital problems: Principal Problem:   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) Active Problems:   Bipolar affective disorder, current episode manic with psychotic symptoms (HCC)   Tobacco abuse   Delirium due to multiple etiologies, acute, hyperactive   Sinus tachycardia   Altered mental state   B12 deficiency   COVID-19 virus infection   Drooling   Essential hypertension   Protein-calorie malnutrition, severe     Plan  ## Safety and Observation Level:  - Based on my clinical evaluation, I estimate the patient to be at moderate risk of self harm in the current setting of safety unawareness; defer management to primary team (pt in lap belt at time of eval)   ## Medications:  -- OK to continue olanzapine 2.5 mg at bedtime started by primary team -- INCREASE  lorazepam to 0.5  mg TID liquid formula   For PRNs, would go with low potency antipsychotics (ie olanzapine) given prior poor response to high potency   -- prior propranalol d/c  03/2022, atropine dc 01/2022  ## Medical Decision Making Capacity:  Patient has interim legal guardian, mother Natalie Brown); clearly lacks capacity for most medical decisions.   ## Further Work-up:  -- most recent EKG on 11/23/2022  had QtC of 407 in setting of tachycardia   -- Pertinent labwork reviewed earlier this admission includes:  Labwork from last month reviewed w/ increasing creatinine.  CMP pending  ## Disposition:  --Recommend memory care unit if possible but defer to SW  ## Behavioral / Environmental:  --  delirium precautions     ##Legal Status Patient with legal guardian  Thank you for this consult request. Recommendations have been communicated to the primary team.  We will continue to follow at this time.   Natalie Brown Natalie Brown    NEW history  Relevant Aspects of Hospital Course:  Admitted on 12/24/2021 for acute encephalopathy; has underlying rapidly progressive and likely terminal dementia.   Patient Report:  Pt seen in afternoon. [name] rocking back and forth and resisting being changed by the nurses. Refused breakfast this AM. Did not get AM ativan. Discussed med changes above w/ mom legal decision maker at bedside - difficult to tell what is catatonia and what is underlying dementia but certainly seems much worse today.     Psychiatric History:  Information collected from chart. Bipolar affective disorder with psychotic features, MNCD Admitted to [facility] since Sep 06, 2021  Family psych history: Patient reports mental illness runs in her father's side of the family.   Social History:  Tobacco use: Half PPD; no use since admission Alcohol use: No use since admission Drug use: Denies; UDS negative on admission in May  Family History:  No pertinent FHx  Medical History: Past Medical History:  Diagnosis Date   Bipolar 1 disorder with moderate mania (HCC) 12/30/2013   Hypertension     Surgical History: History reviewed. No pertinent  surgical history.  Medications:   Current Facility-Administered Medications:    (feeding supplement) PROSource Plus liquid 30 mL, 30 mL, Oral, BID BM, Danford, Earl Lites, MD, 30 mL at 12/25/22 1504   acetaminophen (TYLENOL) tablet 650 mg, 650 mg, Oral, Q6H PRN, Orland Mustard, MD, 650 mg at 12/17/22 2023   alum & mag hydroxide-simeth (MAALOX/MYLANTA) 200-200-20 MG/5ML suspension 30 mL, 30 mL, Oral, Q4H PRN, Orland Mustard, MD   bisacodyl (DULCOLAX) suppository 10 mg, 10 mg, Rectal, Daily PRN, Kc, Ramesh, MD   cyanocobalamin (VITAMIN B12) tablet 500 mcg, 500 mcg, Oral, Daily, Hazeline Junker B, MD, 500 mcg at 12/25/22 1036   enoxaparin (LOVENOX) injection 40 mg, 40 mg, Subcutaneous, Daily, Vann, Jessica U, DO, 40 mg at 12/25/22 1036   feeding supplement (ENSURE ENLIVE / ENSURE PLUS) liquid 237 mL, 237 mL, Oral, BID BM, Danford, Christopher P, MD, 237 mL at 12/25/22 1504   fluticasone (FLONASE) 50 MCG/ACT nasal spray 1 spray, 1 spray, Each Nare, Daily PRN, Orland Mustard, MD   hydrALAZINE (APRESOLINE) injection 10 mg, 10 mg, Intravenous, Q4H PRN, Amin, Ankit C, MD   hydrocerin (EUCERIN) cream, , Topical, BID, Orland Mustard, MD, Given at 12/25/22 1038   ipratropium-albuterol (DUONEB) 0.5-2.5 (3) MG/3ML nebulizer solution 3 mL, 3 mL, Nebulization, Q4H PRN, Amin, Ankit C, MD   lactulose (CHRONULAC) 10 GM/15ML solution 20 g, 20 g, Oral, BID PRN, Almon Hercules, MD  LORazepam (ATIVAN) 2 MG/ML concentrated solution 0.5 mg, 0.5 mg, Oral, TID, Shawntia Mangal Natalie   multivitamin with minerals tablet 1 tablet, 1 tablet, Oral, Daily, Amin, Ankit C, MD, 1 tablet at 12/25/22 1036   OLANZapine zydis (ZYPREXA) disintegrating tablet 2.5 mg, 2.5 mg, Oral, QHS, Samtani, Jai-Gurmukh, MD, 2.5 mg at 12/24/22 2149   ondansetron (ZOFRAN) tablet 4 mg, 4 mg, Oral, Q8H PRN, Orland Mustard, MD, 4 mg at 03/17/22 0139   Oral care mouth rinse, 15 mL, Mouth Rinse, PRN, Leroy Sea, MD   polyethylene glycol (MIRALAX /  GLYCOLAX) packet 17 g, 17 g, Oral, Daily, Pokhrel, Laxman, MD, 17 g at 12/25/22 1036   senna-docusate (Senokot-S) tablet 2 tablet, 2 tablet, Oral, QHS, Gherghe, Daylene Katayama, MD, 2 tablet at 12/24/22 2332   Tdap (BOOSTRIX) injection 0.5 mL, 0.5 mL, Intramuscular, Once, Vann, Jessica U, DO  Allergies: Allergies  Allergen Reactions   Penicillins Itching       Objective  Vital signs:  Temp:  [97.3 F (36.3 C)-98.5 F (36.9 C)] 97.3 F (36.3 C) (09/04 1444) Pulse Rate:  [52-66] 66 (09/04 1444) Resp:  [16-18] 18 (09/04 0524) BP: (94-111)/(61-68) 105/67 (09/04 1444) SpO2:  [98 %-100 %] 100 % (09/04 1444)  Psychiatric Specialty Exam: Barabra Timp Natalie 57 year old AA female, unkempt when seen in hospital bed with lap restraint present. She appeared slightly younger than his stated age and appears malnourished. During the interview, she was quite restless, resisting nursing attempts to dress her, and continually rocking back and forth. Did not see her tracking with eyes today. Did not attempt to have her follow any commands (was not following commands for nursing). Nonverbal again today.  She did not state Natalie mood; impossible to judge insight/judgment/attention/orientation but assume lacking poor. She did not appear to be responding ot internal stimuli when I saw.    Physical Exam: Physical Exam Vitals reviewed.  Constitutional:      General: [name] not in acute distress.    Appearance: [name] not toxic-appearing.  HENT:     Head: Normocephalic and atraumatic.  Pulmonary:     Effort: Pulmonary effort is normal.  Skin:    General: Skin is warm and dry.  Neurological:     General: No focal deficit present.     Mental Status: [name] alert.     Blood pressure 105/67, pulse 66, temperature (!) 97.3 F (36.3 C), temperature source Oral, resp. rate 18, height 5\' 6"  (1.676 m), weight 45.8 kg, SpO2 100%. Body mass index is 16.3 kg/m.

## 2022-12-25 NOTE — TOC Progression Note (Signed)
Transition of Care [facility]) - Progression Note    Patient Details  Name: Natalie Brown MRN: [id number] Date of Birth: 04-02-1966  Transition of [facility]) CM/SW Contact  Inis Sizer, LCSW Phone Number: 12/25/2022, 11:14 AM  Clinical Narrative:    CSW spoke with Velna Hatchet at [place] who states DON is still reviewing patient at this time.  CSW sent request to [facility] Director and [facility] supervisor to request contact be made with DON at facility to obtain a determination on a possible bed offer.    Barriers to Discharge: Financial Resources, Inadequate or no insurance, Homeless with medical needs  Expected Discharge Plan and Services In-house Referral: Clinical Social Work   Post Acute Care Choice: Nursing Home Living arrangements for the past 2 months: Homeless [facility]) Expected Discharge Date: 12/24/21                                     Social Determinants of Health (SDOH) Interventions SDOH Screenings   Food Insecurity: No Food Insecurity (03/05/2022)  Housing: High Risk (03/21/2022)  Alcohol Screen: Low Risk  (09/06/2021)  Depression (PHQ2-9): Medium Risk (05/26/2020)  Tobacco Use: High Risk (03/21/2022)    Readmission Risk Interventions     No data to display

## 2022-12-26 MED ORDER — LORAZEPAM 2 MG/ML PO CONC
1.0000 mg | Freq: Three times a day (TID) | ORAL | Status: DC
  Administered 2022-12-26 – 2023-01-22 (×73): 1 mg via ORAL
  Filled 2022-12-26 (×78): qty 1

## 2022-12-26 MED ORDER — OLANZAPINE 5 MG PO TBDP
5.0000 mg | ORAL_TABLET | Freq: Every day | ORAL | Status: DC
  Administered 2022-12-26: 5 mg via ORAL
  Filled 2022-12-26: qty 1

## 2022-12-26 NOTE — Consult Note (Signed)
[facility] Psychiatry Followup Face-to-Face Psychiatric Evaluation   Name: Natalie Brown DOB: 23-Sep-1965 MRN: [id number] Service Date: December 26, 2022 LOS:  LOS: 365 days  Reason for Consult: "Medication assistance-repetitive behaviors in the setting of severe catatonia previously-also somewhat agitated and requiring restraints-please assess and determine "  Referring Provider: Dr. Mahala Menghini   Assessment  Natalie Brown is a 57 y.o. female admitted medically for 12/24/2021 11:34 PM for possible catatonia/altered mental status. She carries the psychiatric diagnoses of bipolar affective disorder with psychotic symptoms and has a past medical history of hypertension.  This patient has had an unfortunate, rapid decline over the past several months to a year. She was initially admitted to the behavioral health hospital after decompensating with hx bipolar I disorder (was sleeping outside in the rain, confused, issues remaining on task) in 08/2021. Her psychosis and mania stabilized after about 4 weeks of admission w/ multiple medication trials (Zyprexa, haldol, and Invega). This admission revealed a significant and rapidly progressive dementia. She had a prolonged admission to behavioral health due to no safe discharge options and was transferred to the hospital in 12/2021 after 4 months after developing delirium/encephalopathy while admitted to [facility]. She has been admitted to [facility] since 12/2021 (now almost 1 year) and over that time has become almost totally nonverbal however has not been overtly manic or psychotic during that time span; dementia dx and sx has largely superceded these clinical features. Psychiatry consult team has been involved intermittently in her care over the last year (saw at admission 12/2021 when [facility] regimen continued, in 06/2022 (tapered off of haloperidol d/t rigidity/dystonia - attempted to transition to paliperidone however mother/guardian did not pick up the phone)) and  was re consulted by Dr. Mahala Menghini due to repetitive behaviors (rocking back and forth). Her family is mostly concerned that these behaviors are preventing her from eating and will lead to hastened death.   2022/12/25: marked decline since I last saw in March. Rocking back and forth, no meaningful interaction. Mom consented to limited trial of ativan. Plan to reassess W 9/4 unless need for assessment tomorrow.   9/3: large interval improvement - pt interacting meaningfully with world around her. Have asked mom to keep an eye on if rocking/withdrawal worsens in late afternoon (will try to swing by) and might add on 12 noon dose of ativan if behaviors recur around expected trough.   9/4: seen in afternoon, again rocking back and forth - per mom also rocking yesterday afternoon. Nonverbal. Missed AM dose ativan (nurse couldn't push oral intake). Will bump ativan to TID and liquid formula to hopefully improve adherence.   9/5: Patient seen this morning.  She continues to be sitting in her bed with her knees up and arms wrapped around them as she rocks.  Patient is nonverbal.  She did miss a dose of Ativan yesterday.  I will plan to increase Ativan back to 1 mg 3 times daily and also increase her Zyprexa ODT to 5 mg at bedtime.  Diagnoses:  Active Hospital problems: Principal Problem:   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) Active Problems:   Bipolar affective disorder, current episode manic with psychotic symptoms (HCC)   Tobacco abuse   Delirium due to multiple etiologies, acute, hyperactive   Sinus tachycardia   Altered mental state   B12 deficiency   COVID-19 virus infection   Drooling   Essential hypertension   Protein-calorie malnutrition, severe     Plan  ## Safety and  Observation Level:  - Based on my clinical evaluation, I estimate the patient to be at moderate risk of self harm in the current setting of safety unawareness; defer management to primary team (pt in lap belt at  time of eval)   ## Medications:  -- Increase olanzapine 5 mg at bedtime started by primary team -- INCREASE  lorazepam to 1 mg TID liquid formula   For PRNs, would go with low potency antipsychotics (ie olanzapine) given prior poor response to high potency   -- prior propranolol d/c 03/2022, atropine dc 01/2022  ## Medical Decision Making Capacity:  Patient has interim legal guardian, mother Natalie Brown [phone]); clearly lacks capacity for most medical decisions.   ## Further Work-up:  -- most recent EKG on 11/23/2022  had QtC of 407 in setting of tachycardia   -- Pertinent labwork reviewed earlier this admission includes:  Labwork from last month reviewed w/ increasing creatinine.  CMP pending  ## Disposition:  --Recommend memory care unit if possible but defer to SW  ## Behavioral / Environmental:  --  delirium precautions     ##Legal Status Patient with legal guardian  Thank you for this consult request. Recommendations have been communicated to the primary team.  We will continue to follow at this time.   Natalie Craft, MD  Total Time Spent in Direct Patient Care:  I personally spent 25 minutes on the unit in direct patient care. The direct patient care time included face-to-face time with the patient, reviewing the patient's chart, communicating with other professionals, and coordinating care. Greater than 50% of this time was spent in counseling or coordinating care with the patient regarding goals of hospitalization, psycho-education, and discharge planning needs.   NEW history  Relevant Aspects of Hospital Course:  Admitted on 12/24/2021 for acute encephalopathy; has underlying rapidly progressive and likely terminal dementia.   Patient Report:  Pt seen in  the morning. [name] rocking back and forth and nonverbal. - difficult to tell what is catatonia and what is underlying dementia but certainly seems much worse today.     Psychiatric History:  Information  collected from chart. Bipolar affective disorder with psychotic features, MNCD Admitted to [facility] since Sep 06, 2021  Family psych history: Patient reports mental illness runs in her father's side of the family.   Social History:  Tobacco use: Half PPD; no use since admission Alcohol use: No use since admission Drug use: Denies; UDS negative on admission in May  Family History:  No pertinent FHx  Medical History: Past Medical History:  Diagnosis Date   Bipolar 1 disorder with moderate mania (HCC) 12/30/2013   Hypertension     Surgical History: History reviewed. No pertinent surgical history.  Medications:   Current Facility-Administered Medications:    (feeding supplement) PROSource Plus liquid 30 mL, 30 mL, Oral, BID BM, Danford, Earl Lites, MD, 30 mL at 12/26/22 1459   acetaminophen (TYLENOL) tablet 650 mg, 650 mg, Oral, Q6H PRN, Orland Mustard, MD, 650 mg at 12/17/22 2023   alum & mag hydroxide-simeth (MAALOX/MYLANTA) 200-200-20 MG/5ML suspension 30 mL, 30 mL, Oral, Q4H PRN, Orland Mustard, MD   bisacodyl (DULCOLAX) suppository 10 mg, 10 mg, Rectal, Daily PRN, Kc, Ramesh, MD   cyanocobalamin (VITAMIN B12) tablet 500 mcg, 500 mcg, Oral, Daily, Hazeline Junker B, MD, 500 mcg at 12/26/22 1007   enoxaparin (LOVENOX) injection 40 mg, 40 mg, Subcutaneous, Daily, Vann, Jessica U, DO, 40 mg at 12/26/22 1007   feeding supplement (ENSURE ENLIVE /  ENSURE PLUS) liquid 237 mL, 237 mL, Oral, BID BM, Danford, Earl Lites, MD, 237 mL at 12/26/22 1459   fluticasone (FLONASE) 50 MCG/ACT nasal spray 1 spray, 1 spray, Each Nare, Daily PRN, Orland Mustard, MD   hydrALAZINE (APRESOLINE) injection 10 mg, 10 mg, Intravenous, Q4H PRN, Amin, Ankit C, MD   hydrocerin (EUCERIN) cream, , Topical, BID, Orland Mustard, MD, Given at 12/26/22 1009   ipratropium-albuterol (DUONEB) 0.5-2.5 (3) MG/3ML nebulizer solution 3 mL, 3 mL, Nebulization, Q4H PRN, Amin, Ankit C, MD   lactulose (CHRONULAC) 10 GM/15ML  solution 20 g, 20 g, Oral, BID PRN, Alanda Slim, Taye T, MD   LORazepam (ATIVAN) 2 MG/ML concentrated solution 1 mg, 1 mg, Oral, TID, Natalie Craft, MD, 1 mg at 12/26/22 1643   multivitamin with minerals tablet 1 tablet, 1 tablet, Oral, Daily, Amin, Ankit C, MD, 1 tablet at 12/26/22 1008   OLANZapine zydis (ZYPREXA) disintegrating tablet 5 mg, 5 mg, Oral, QHS, Natalie Craft, MD   ondansetron [facility]) tablet 4 mg, 4 mg, Oral, Q8H PRN, Orland Mustard, MD, 4 mg at 03/17/22 0139   Oral care mouth rinse, 15 mL, Mouth Rinse, PRN, Leroy Sea, MD   polyethylene glycol (MIRALAX / GLYCOLAX) packet 17 g, 17 g, Oral, Daily, Pokhrel, Laxman, MD, 17 g at 12/26/22 1007   senna-docusate (Senokot-S) tablet 2 tablet, 2 tablet, Oral, QHS, Gherghe, Daylene Katayama, MD, 2 tablet at 12/26/22 0444   Tdap (BOOSTRIX) injection 0.5 mL, 0.5 mL, Intramuscular, Once, Vann, Jessica U, DO  Allergies: Allergies  Allergen Reactions   Penicillins Itching       Objective  Vital signs:  Temp:  [97.1 F (36.2 C)] 97.1 F (36.2 C) (09/04 1948) Pulse Rate:  [53-113] 113 (09/05 1735) Resp:  [18-20] 18 (09/05 1735) BP: (93-120)/(51-76) 96/51 (09/05 1735) SpO2:  [100 %] 100 % (09/05 0424)  Psychiatric Specialty Exam: Kasi Reddicks a 57 year old AA female, unkempt when seen in hospital bed with lap restraint present. She appeared slightly younger than his stated age and appears malnourished. During the interview, she was quite restless, resisting nursing attempts to dress her, and continually rocking back and forth. Did not see her tracking with eyes today. Did not attempt to have her follow any commands (was not following commands for nursing). Nonverbal again today.  She did not state a mood; impossible to judge insight/judgment/attention/orientation but assume lacking poor. She did not appear to be responding to internal stimuli.   Physical Exam: Physical Exam Vitals reviewed.  Constitutional:      General: [name] not  in acute distress.    Appearance: [name] not toxic-appearing.     Comments: thin  HENT:     Head: Normocephalic and atraumatic.  Cardiovascular:     Rate and Rhythm: Tachycardia present.  Pulmonary:     Effort: Pulmonary effort is normal.  Neurological:     General: No focal deficit present.     Blood pressure (!) 96/51, pulse (!) 113, temperature (!) 97.1 F (36.2 C), resp. rate 18, height 5\' 6"  (1.676 m), weight 45.8 kg, SpO2 100%. Body mass index is 16.3 kg/m.

## 2022-12-26 NOTE — Progress Notes (Signed)
Progress Note   Patient: Natalie Brown OZH:[id number] DOB: 09/04/1965 DOA: 12/24/2021     365 DOS: the patient was seen and examined on 12/26/2022   Brief hospital course: 57 y.o. 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    initially admitted to [facility] 08/2021 with agitation and aggressive behaviors.  Rx manic Bipolar, but after several months in [facility] noted significant cognitive impairment, and dementia was suspected.  Patient was observed to have significant short term memory impairment and worsening hygiene, MOCA 11/30 and CT head revealed advanced cerebral atrophy.     September 2023, patient was transferred for further evaluation and treatment due to concerns of new metabolic encephalopathy (that appear to have developed acutely over ~1 day).  Neurology and Psychiatry were consulted.  EEG was unremarkable.  Ammonia, TSH and RPR were normal.  MRI brain revealed no acute findings, but confirmed advanced atrophy.  Neurology suspected a particularly aggressive premature dementia.     Initially forgetful but redirectable and oriented, she is now abulic and essentially nonverbal (nursing note occasional staff or some family members can elicit more responses, but they are very limited).  Cannot bathe or toilet independently.  Patient holds food in the mouth, recent calorie count (7/26-7/29) was observed to eat 50% of meals or less.   Nursing report she does not swallow her pills regularly due to pocketing of pills or holding pills in the mouth for long periods.  In the last 6 months, she has had 22 kg weight loss and has become noticeably weaker and more prone to falls when attempting to walk (which she cannot do safely without assistance).   Palliative have been consulted and have met with family. Family are aware her dementia and weight loss are terminal.     Assessment and Plan: Early onset dementia   Bipolar Disorder Ammonia, TSH, RPR normal.  EEG normal.  MRI brain shows advanced atrophy.   Neurology/psychiatry feel condition clearly irreversible at the time of first presentation and work up for autoimmune, paraneoplastic or prion diseases would have been futile. Mother and sister aware of terminal condition. weaned off of Haldol over the last 4 months with no discernible change in behavior. - Full supportive care - Palliative care has seen 11/18/22--they are aware --I did clearly elicit from the Mother [name] that we are seeing adult failure to thrive, and that she is clearly Hospice ELigible.  Mother will think over this but seems resistant to the idea. - cont olanzapine 2.5 at bedtime at night  - Psychiatry following, now on 0.5 lorazepam twice daily. Psychiatry had recommended continued trial of ativan liquid if possible     Severe protein calorie malnutrition has lost close to 60 pounds since October of last year and continues to lose weight - Continue nutrition supplements, MVI as much as able - Appreciate Palliative Care and dietitian consultations  - Labs were not drawn despite me ordering them - Medical condition is progressive, irreversible and terminal, thus it was previously determined that tube feeds and CPR are futile and should not be offered.  Patient pulls out all IV lines, and there is a high degree of certainty that IV fluids would be difficult and futile as well.   Vitamin B12 Deficiency:   - Continue cyanocobalamin   Essential hypertension  BP normalized off medication   CKD Stage IIIa Cr stable   Physical Exam: BP (!) 138/92 (BP  Location: Left Arm)   Pulse 78   Temp 97.6 F (36.4 C) (Oral)   Resp 18   Ht 5\' 6"  (1.676 m)   Wt 45.8 kg   SpO2 92%   BMI 16.30 kg/m         Subjective: Cannot assess given mentation  Physical Exam: Vitals:   12/25/22 1444 12/25/22 1948 12/25/22 2100 12/26/22 0424  BP: 105/67 93/62 98/62  120/76  Pulse: 66 (!)  53 66 (!) 58  Resp:  20    Temp: (!) 97.3 F (36.3 C) (!) 97.1 F (36.2 C)    TempSrc: Oral     SpO2: 100% 100%  100%  Weight:      Height:       General exam: Not conversant, in no acute distress. Did briefly open eyes when calling out her name initially, but then remained with eyes closed Respiratory system: normal chest rise, clear, no audible wheezing Cardiovascular system: regular rhythm, s1-s2 Gastrointestinal system: Nondistended, nontender, pos BS Central nervous system: No seizures, no tremors Extremities: No cyanosis, no joint deformities Skin: No rashes, no pallor Psychiatry: Unable to assess given mentation    Data Reviewed:  There are no new results to review at this time.  Family Communication: Pt in room, family not at bedside  Disposition: Status is: Inpatient Remains inpatient appropriate because: severity of illness  Planned Discharge Destination:  Unclear at this time     Author: Rickey Barbara, MD 12/26/2022 1:51 PM  For on call review [email or web address].

## 2022-12-26 NOTE — Progress Notes (Addendum)
Pt slept most of the night. Unable to take her oral meds. Woke up around 4am. Ativan and Zyprexa given with ice cream. She ate 2 cups of strawberry ice cream.

## 2022-12-26 NOTE — TOC Progression Note (Signed)
Transition of Care [facility]) - Progression Note    Patient Details  Name: Natalie Brown MRN: [id number] Date of Birth: 1965-08-09  Transition of Care [facility]) CM/SW Contact  Inis Sizer, LCSW Phone Number: 12/26/2022, 1:09 PM  Clinical Narrative:    CSW spoke with [facility] Director who states there is a meeting scheduled for tomorrow afternoon to determine if patient will be offered a bed at [facility]. TOC Director will return call to CSW to follow up after tomorrow's meeting.  CSW spoke with patient's mother [name] to discuss updates regarding placement. CSW informed [name] of efforts to get patient placed at [facility]. [name] stated understanding of plan.  CSW will return call to [place] once updates become available.      Barriers to Discharge: Financial Resources, Inadequate or no insurance, Homeless with medical needs  Expected Discharge Plan and Services In-house Referral: Clinical Social Work   Post Acute Care Choice: Nursing Home Living arrangements for the past 2 months: Homeless [facility]) Expected Discharge Date: 12/24/21                                     Social Determinants of Health (SDOH) Interventions SDOH Screenings   Food Insecurity: No Food Insecurity (03/05/2022)  Housing: High Risk (03/21/2022)  Alcohol Screen: Low Risk  (09/06/2021)  Depression (PHQ2-9): Medium Risk (05/26/2020)  Tobacco Use: High Risk (03/21/2022)    Readmission Risk Interventions     No data to display

## 2022-12-27 MED ORDER — OLANZAPINE 5 MG PO TBDP
5.0000 mg | ORAL_TABLET | Freq: Once | ORAL | Status: AC
  Administered 2022-12-27: 5 mg via ORAL
  Filled 2022-12-27: qty 1

## 2022-12-27 MED ORDER — OLANZAPINE 5 MG PO TBDP
5.0000 mg | ORAL_TABLET | Freq: Two times a day (BID) | ORAL | Status: DC
  Administered 2022-12-27 – 2023-01-02 (×11): 5 mg via ORAL
  Filled 2022-12-27 (×12): qty 1

## 2022-12-27 NOTE — Plan of Care (Signed)
  Problem: Clinical Measurements: Goal: Will remain free from infection Outcome: Progressing Goal: Respiratory complications will improve Outcome: Progressing   Problem: Activity: Goal: Risk for activity intolerance will decrease Outcome: Progressing   Problem: Nutrition: Goal: Adequate nutrition will be maintained Outcome: Progressing   Problem: Coping: Goal: Level of anxiety will decrease Outcome: Progressing   Problem: Elimination: Goal: Will not experience complications related to bowel motility Outcome: Progressing Goal: Will not experience complications related to urinary retention Outcome: Progressing   Problem: Pain Managment: Goal: General experience of comfort will improve Outcome: Progressing   Problem: Safety: Goal: Ability to remain free from injury will improve Outcome: Progressing   Problem: Skin Integrity: Goal: Risk for impaired skin integrity will decrease Outcome: Progressing

## 2022-12-27 NOTE — Progress Notes (Signed)
Progress Note   Patient: Natalie Brown [id number]:[id number] DOB: November 06, 1965 DOA: 12/24/2021     366 DOS: the patient was seen and examined on 12/27/2022   Brief hospital course: 57 y.o. 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    initially admitted to [facility] 08/2021 with agitation and aggressive behaviors.  Rx manic Bipolar, but after several months in [facility] noted significant cognitive impairment, and dementia was suspected.  Patient was observed to have significant short term memory impairment and worsening hygiene, MOCA 11/30 and CT head revealed advanced cerebral atrophy.     September 2023, patient was transferred for further evaluation and treatment due to concerns of new metabolic encephalopathy (that appear to have developed acutely over ~1 day).  Neurology and Psychiatry were consulted.  EEG was unremarkable.  Ammonia, TSH and RPR were normal.  MRI brain revealed no acute findings, but confirmed advanced atrophy.  Neurology suspected a particularly aggressive premature dementia.     Initially forgetful but redirectable and oriented, she is now abulic and essentially nonverbal (nursing note occasional staff or some family members can elicit more responses, but they are very limited).  Cannot bathe or toilet independently.  Patient holds food in the mouth, recent calorie count (7/26-7/29) was observed to eat 50% of meals or less.   Nursing report she does not swallow her pills regularly due to pocketing of pills or holding pills in the mouth for long periods.  In the last 6 months, she has had 22 kg weight loss and has become noticeably weaker and more prone to falls when attempting to walk (which she cannot do safely without assistance).   Palliative have been consulted and have met with family. Family are aware her dementia and weight loss are terminal.     Assessment and Plan: Early onset dementia   Bipolar Disorder Ammonia, TSH, RPR normal.  EEG normal.  MRI brain shows advanced atrophy.   Neurology/psychiatry feel condition clearly irreversible at the time of first presentation and work up for autoimmune, paraneoplastic or prion diseases would have been futile. Mother and sister aware of terminal condition. weaned off of Haldol over the last 4 months with no discernible change in behavior. - Full supportive care - Palliative care has seen 11/18/22--they are aware --I did clearly elicit from the Mother Natalie Brown that we are seeing adult failure to thrive, and that she is clearly Hospice ELigible.  Mother will think over this but seems resistant to the idea. - cont olanzapine 2.5 at bedtime at night  - Psychiatry following, recs to continue 0.5 lorazepam twice daily. Psychiatry had recommended continued trial of ativan liquid if possible     Severe protein calorie malnutrition has lost close to 60 pounds since October of last year and continues to lose weight - Continue nutrition supplements, MVI as much as able - Appreciate Palliative Care and dietitian consultations  - Labs were not drawn despite me ordering them - Medical condition is progressive, irreversible and terminal, thus it was previously determined that tube feeds and CPR are futile and should not be offered.  Patient pulls out all IV lines, and there is a high degree of certainty that IV fluids would be difficult and futile as well.   Vitamin B12 Deficiency:   - Continue cyanocobalamin   Essential hypertension  BP normalized off medication   CKD Stage IIIa Cr stable   Physical Exam: BP (!) 138/92 (  BP Location: Left Arm)   Pulse 78   Temp 97.6 F (36.4 C) (Oral)   Resp 18   Ht 5\' 6"  (1.676 m)   Wt 45.8 kg   SpO2 92%   BMI 16.30 kg/m         Subjective: Unable to assess given mentatin  Physical Exam: Vitals:   12/26/22 0424 12/26/22 1735 12/26/22 2132 12/27/22 0545  BP: 120/76 (!) 96/51 90/61 (!)  85/63  Pulse: (!) 58 (!) 113 74 (!) 58  Resp:  18 18 18   Temp:    98 F (36.7 C)  TempSrc:      SpO2: 100%  100% 100%  Weight:    45.6 kg  Height:       General exam: Asleep, responds to bright light, but does not open eyes, laying in bed, in nad Respiratory system: Normal respiratory effort, no wheezing Cardiovascular system: regular rate, s1, s2 Gastrointestinal system: Soft, nondistended, positive BS Central nervous system: CN2-12 grossly intact, strength intact Extremities: Perfused, no clubbing Skin: Normal skin turgor, no notable skin lesions seen Psychiatry: Unable to assess given mentation    Data Reviewed:  There are no new results to review at this time.  Family Communication: Pt in room, family not at bedside  Disposition: Status is: Inpatient Remains inpatient appropriate because: severity of illness  Planned Discharge Destination:  Unclear at this time     Author: Rickey Barbara, MD 12/27/2022 3:47 PM  For on call review [email or web address].

## 2022-12-27 NOTE — Consult Note (Addendum)
[facility] Psychiatry Followup Face-to-Face Psychiatric Evaluation   Name: Natalie Brown DOB: [birth year] MRN: [id number] Service Date: December 27, 2022 LOS:  LOS: 366 days  Reason for Consult: "Medication assistance-repetitive behaviors in the setting of severe catatonia previously-also somewhat agitated and requiring restraints-please assess and determine "  Referring Provider: Dr. Mahala Menghini   Assessment  Natalie Brown is a 57 y.o. female admitted medically for 12/24/2021 11:34 PM for possible catatonia/altered mental status. She carries the psychiatric diagnoses of bipolar affective disorder with psychotic symptoms and has a past medical history of hypertension.  This patient has had an unfortunate, rapid decline over the past several months to a year. She was initially admitted to the behavioral health hospital after decompensating with hx bipolar I disorder (was sleeping outside in the rain, confused, issues remaining on task) in 08/2021. Her psychosis and mania stabilized after about 4 weeks of admission w/ multiple medication trials (Zyprexa, haldol, and Invega). This admission revealed a significant and rapidly progressive dementia. She had a prolonged admission to behavioral health due to no safe discharge options and was transferred to the hospital in 12/2021 after 4 months after developing delirium/encephalopathy while admitted to [facility]. She has been admitted to [facility] since 12/2021 (now almost 1 year) and over that time has become almost totally nonverbal however has not been overtly manic or psychotic during that time span; dementia dx and sx has largely superceded these clinical features. Psychiatry consult team has been involved intermittently in her care over the last year (saw at admission 12/2021 when [facility] regimen continued, in 06/2022 (tapered off of haloperidol d/t rigidity/dystonia - attempted to transition to paliperidone however mother/guardian did not pick up the phone)) and  was re consulted by Dr. Mahala Menghini due to repetitive behaviors (rocking back and forth). Her family is mostly concerned that these behaviors are preventing her from eating and will lead to hastened death.   Jan 05, 2023: marked decline since I last saw in March. Rocking back and forth, no meaningful interaction. Mom consented to limited trial of ativan. Plan to reassess W 9/4 unless need for assessment tomorrow.   9/3: large interval improvement - pt interacting meaningfully with world around her. Have asked mom to keep an eye on if rocking/withdrawal worsens in late afternoon (will try to swing by) and might add on 12 noon dose of ativan if behaviors recur around expected trough.   9/4: seen in afternoon, again rocking back and forth - per mom also rocking yesterday afternoon. Nonverbal. Missed AM dose ativan (nurse couldn't push oral intake). Will bump ativan to TID and liquid formula to hopefully improve adherence.   9/5: Patient seen this morning.  She continues to be sitting in her bed with her knees up and arms wrapped around them as she rocks.  Patient is nonverbal.  She did miss a dose of Ativan yesterday.  I will plan to increase Ativan back to 1 mg 3 times daily and also increase her Zyprexa ODT to 5 mg at bedtime.  9/6 Patient seen late morning. Sitting in her bed with knees bent to chest, arms are outstretched. [name] nonverbal and is staring straight into the TV. She pushes my hands away when I try to hold her hands. Has notable grasp reflex on L hand.  Diagnoses:  Active Hospital problems: Principal Problem:   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) Active Problems:   Bipolar affective disorder, current episode manic with psychotic symptoms (HCC)   Tobacco abuse  Delirium due to multiple etiologies, acute, hyperactive   Sinus tachycardia   Altered mental state   B12 deficiency   COVID-19 virus infection   Drooling   Essential hypertension   Protein-calorie malnutrition,  severe     Plan  ## Safety and Observation Level:  - Based on my clinical evaluation, I estimate the patient to be at moderate risk of self harm in the current setting of safety unawareness; defer management to primary team (pt in lap belt at time of eval)  ## Medications:  -- increase olanzapine oral-dissolvable tablet 5 mg from at bedtime to twice daily -- continue lorazepam liquid formula 1 mg three times daily  For PRNs, would go with low potency antipsychotics (ie olanzapine) given prior poor response to high potency   -- prior propranolol d/c 03/2022, atropine dc 01/2022  ## Medical Decision Making Capacity:  Patient has interim legal guardian, mother [name] [phone]); clearly lacks capacity for most medical decisions.   ## Further Work-up:  -- most recent EKG on 11/23/2022  had QtC of 407 in setting of tachycardia  -- Pertinent labwork reviewed earlier this admission includes:  Labwork from last month reviewed w/ increasing creatinine.  CMP pending  ## Disposition:  --Recommend memory care unit if possible but defer to SW  ## Behavioral / Environmental:  --  delirium precautions     ##Legal Status Patient with legal guardian  Thank you for this consult request. Recommendations have been communicated to the primary team.  We will continue to follow at this time.   Augusto Gamble, MD  Total Time Spent in Direct Patient Care:  I personally spent 25 minutes on the unit in direct patient care. The direct patient care time included face-to-face time with the patient, reviewing the patient's chart, communicating with other professionals, and coordinating care. Greater than 50% of this time was spent in counseling or coordinating care with the patient regarding goals of hospitalization, psycho-education, and discharge planning needs.   NEW history  Relevant Aspects of Hospital Course:  Admitted on 12/24/2021 for acute encephalopathy; has underlying rapidly progressive and  likely terminal dementia.   Patient Report:  Patient unable to participate in interview.   Psychiatric History:  Information collected from chart. Bipolar affective disorder with psychotic features, MNCD Admitted to [facility] since Sep 06, 2021  Family psych history: Patient reports mental illness runs in her father's side of the family.   Social History:  Tobacco use: Half PPD; no use since admission Alcohol use: No use since admission Drug use: Denies; UDS negative on admission in May  Family History:  No pertinent FHx  Medical History: Past Medical History:  Diagnosis Date   Bipolar 1 disorder with moderate mania (HCC) 12/30/2013   Hypertension     Surgical History: History reviewed. No pertinent surgical history.  Medications:   Current Facility-Administered Medications:    (feeding supplement) PROSource Plus liquid 30 mL, 30 mL, Oral, BID BM, Danford, Earl Lites, MD, 30 mL at 12/27/22 1042   acetaminophen (TYLENOL) tablet 650 mg, 650 mg, Oral, Q6H PRN, Orland Mustard, MD, 650 mg at 12/17/22 2023   alum & mag hydroxide-simeth (MAALOX/MYLANTA) 200-200-20 MG/5ML suspension 30 mL, 30 mL, Oral, Q4H PRN, Orland Mustard, MD   bisacodyl (DULCOLAX) suppository 10 mg, 10 mg, Rectal, Daily PRN, Kc, Ramesh, MD   cyanocobalamin (VITAMIN B12) tablet 500 mcg, 500 mcg, Oral, Daily, Hazeline Junker B, MD, 500 mcg at 12/27/22 1042   enoxaparin (LOVENOX) injection 40 mg, 40 mg, Subcutaneous,  Daily, Marlin Canary U, DO, 40 mg at 12/27/22 1042   feeding supplement (ENSURE ENLIVE / ENSURE PLUS) liquid 237 mL, 237 mL, Oral, BID BM, Danford, Christopher P, MD, 237 mL at 12/27/22 1046   fluticasone (FLONASE) 50 MCG/ACT nasal spray 1 spray, 1 spray, Each Nare, Daily PRN, Orland Mustard, MD   hydrALAZINE (APRESOLINE) injection 10 mg, 10 mg, Intravenous, Q4H PRN, Amin, Ankit C, MD   hydrocerin (EUCERIN) cream, , Topical, BID, Orland Mustard, MD, Given at 12/27/22 1046   ipratropium-albuterol (DUONEB)  0.5-2.5 (3) MG/3ML nebulizer solution 3 mL, 3 mL, Nebulization, Q4H PRN, Amin, Ankit C, MD   lactulose (CHRONULAC) 10 GM/15ML solution 20 g, 20 g, Oral, BID PRN, Alanda Slim, Taye T, MD   LORazepam (ATIVAN) 2 MG/ML concentrated solution 1 mg, 1 mg, Oral, TID, Mariel Craft, MD, 1 mg at 12/27/22 1042   multivitamin with minerals tablet 1 tablet, 1 tablet, Oral, Daily, Amin, Ankit C, MD, 1 tablet at 12/27/22 1042   OLANZapine zydis (ZYPREXA) disintegrating tablet 5 mg, 5 mg, Oral, QHS, Mariel Craft, MD, 5 mg at 12/26/22 2133   ondansetron [facility]) tablet 4 mg, 4 mg, Oral, Q8H PRN, Orland Mustard, MD, 4 mg at 03/17/22 0139   Oral care mouth rinse, 15 mL, Mouth Rinse, PRN, Leroy Sea, MD   polyethylene glycol (MIRALAX / GLYCOLAX) packet 17 g, 17 g, Oral, Daily, Pokhrel, Laxman, MD, 17 g at 12/27/22 1043   senna-docusate (Senokot-S) tablet 2 tablet, 2 tablet, Oral, QHS, Gherghe, Daylene Katayama, MD, 2 tablet at 12/26/22 2133   Tdap (BOOSTRIX) injection 0.5 mL, 0.5 mL, Intramuscular, Once, Marlin Canary U, DO  Allergies: Allergies  Allergen Reactions   Penicillins Itching       Objective  Vital signs:  Temp:  [98 F (36.7 C)] 98 F (36.7 C) (09/06 0545) Pulse Rate:  [58-113] 58 (09/06 0545) Resp:  [18] 18 (09/06 0545) BP: (85-96)/(51-63) 85/63 (09/06 0545) SpO2:  [100 %] 100 % (09/06 0545) Weight:  [45.6 kg] 45.6 kg (09/06 0545)  Psychiatric Specialty Exam: Psychiatric Specialty Exam  Presentation General Appearance: Bizarre; Disheveled  Eye Contact:Absent  Speech:-- (nonverbal)  Speech Volume:-- (nonverbal)  Handedness:Right   Mood and Affect  Mood:-- (nonverbal)  Affect:Flat   Thought Process  Thought Processes:-- (nonverbal)  Descriptions of Associations:-- (nonverbal)  Orientation:None  Thought Content:-- (nonverbal)  Diagnosis of Schizophrenia or Schizoaffective disorder in past: No  Hallucinations:Hallucinations: -- (nonverbal)  Ideas of Reference:--  (nonverbal)  Suicidal Thoughts:Suicidal Thoughts: -- (nonverbal)  Homicidal Thoughts:Homicidal Thoughts: -- (nonverbal)   Sensorium  Memory:-- (nonverbal)  Judgment:Impaired  Insight:Lacking   Executive Functions  Concentration:Poor  Attention Span:Poor  Recall:-- (nonverbal)  Fund of Knowledge:-- (nonverbal)  Language:-- (nonverbal)   Psychomotor Activity  Psychomotor Activity:Psychomotor Activity: Restlessness   Assets  Assets:Resilience   Sleep  Sleep:Sleep: -- (nonverbal)    Physical Exam: Physical Exam Vitals reviewed.  Constitutional:      General: [name] not in acute distress.    Appearance: [name] not toxic-appearing.     Comments: thin  HENT:     Head: Normocephalic and atraumatic.  Pulmonary:     Effort: Pulmonary effort is normal.  Neurological:     General: No focal deficit present.     Blood pressure (!) 85/63, pulse (!) 58, temperature 98 F (36.7 C), resp. rate 18, height 5\' 6"  (1.676 m), weight 45.6 kg, SpO2 100%. Body mass index is 16.23 kg/m.

## 2022-12-27 NOTE — Progress Notes (Addendum)
Patient is calm and sleeping, soft lap belt restraint removed. Bed alarm in place.   Natalie Brown

## 2022-12-28 NOTE — Consult Note (Signed)
[facility] Psychiatry Followup Face-to-Face Psychiatric Evaluation   Name: Natalie Brown DOB: Jul 09, 1965 MRN: [id number] Service Date: December 28, 2022 LOS:  LOS: 367 days  Reason for Consult: "Medication assistance-repetitive behaviors in the setting of severe catatonia previously-also somewhat agitated and requiring restraints-please assess and determine "  Referring Provider: Dr. Mahala Menghini   Assessment  Natalie Brown is a 57 y.o. female admitted medically for 12/24/2021 11:34 PM for possible catatonia/altered mental status. She carries the psychiatric diagnoses of bipolar affective disorder with psychotic symptoms and has a past medical history of hypertension.  This patient has had an unfortunate, rapid decline over the past several months to a year. She was initially admitted to the behavioral health hospital after decompensating with hx bipolar I disorder (was sleeping outside in the rain, confused, issues remaining on task) in 08/2021. Her psychosis and mania stabilized after about 4 weeks of admission w/ multiple medication trials (Zyprexa, haldol, and Invega). This admission revealed a significant and rapidly progressive dementia. She had a prolonged admission to behavioral health due to no safe discharge options and was transferred to the hospital in 12/2021 after 4 months after developing delirium/encephalopathy while admitted to [facility]. She has been admitted to [facility] since 12/2021 (now almost 1 year) and over that time has become almost totally nonverbal however has not been overtly manic or psychotic during that time span; dementia dx and sx has largely superceded these clinical features. Psychiatry consult team has been involved intermittently in her care over the last year (saw at admission 12/2021 when [facility] regimen continued, in 06/2022 (tapered off of haloperidol d/t rigidity/dystonia - attempted to transition to paliperidone however mother/guardian did not pick up the phone)) and  was re consulted by Dr. Mahala Menghini due to repetitive behaviors (rocking back and forth). Her family is mostly concerned that these behaviors are preventing her from eating and will lead to hastened death.   01/02/23: marked decline since I last saw in March. Rocking back and forth, no meaningful interaction. Mom consented to limited trial of ativan. Plan to reassess W 9/4 unless need for assessment tomorrow.   9/3: large interval improvement - pt interacting meaningfully with world around her. Have asked mom to keep an eye on if rocking/withdrawal worsens in late afternoon (will try to swing by) and might add on 12 noon dose of ativan if behaviors recur around expected trough.   9/4: seen in afternoon, again rocking back and forth - per mom also rocking yesterday afternoon. Nonverbal. Missed AM dose ativan (nurse couldn't push oral intake). Will bump ativan to TID and liquid formula to hopefully improve adherence.   9/5: Patient seen this morning.  She continues to be sitting in her bed with her knees up and arms wrapped around them as she rocks.  Patient is nonverbal.  She did miss a dose of Ativan yesterday.  I will plan to increase Ativan back to 1 mg 3 times daily and also increase her Zyprexa ODT to 5 mg at bedtime.  9/6 Patient seen late morning. Sitting in her bed with knees bent to chest, arms are outstretched. [name] nonverbal and is staring straight into the TV. She pushes my hands away when I try to hold her hands. Has notable grasp reflex on L hand.  12/28/22: Patient seen today in her hospital room. [name] awake, alert, cooperative but remains non-verbal. [name] attempting to talk but unable to verbalize her feelings yet.   Diagnoses:  Active Hospital problems:  Principal Problem:   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) Active Problems:   Bipolar affective disorder, current episode manic with psychotic symptoms (HCC)   Tobacco abuse   Delirium due to multiple etiologies,  acute, hyperactive   Sinus tachycardia   Altered mental state   B12 deficiency   COVID-19 virus infection   Drooling   Essential hypertension   Protein-calorie malnutrition, severe     Plan  ## Safety and Observation Level:  - Based on my clinical evaluation, I estimate the patient to be at moderate risk of self harm in the current setting of safety unawareness; defer management to primary team (pt in lap belt at time of eval)  ## Medications:  -- increase olanzapine oral-dissolvable tablet 5 mg from at bedtime to twice daily -- continue lorazepam liquid formula 1 mg three times daily  For PRNs, would go with low potency antipsychotics (ie olanzapine) given prior poor response to high potency   -- prior propranolol d/c 03/2022, atropine dc 01/2022  ## Medical Decision Making Capacity:  Patient has interim legal guardian, mother Natalie Brown [phone]); clearly lacks capacity for most medical decisions.   ## Further Work-up:  -- most recent EKG on 11/23/2022  had QtC of 407 in setting of tachycardia  -- Pertinent labwork reviewed earlier this admission includes:  Labwork from last month reviewed w/ increasing creatinine.  CMP pending  ## Disposition:  --Recommend memory care unit if possible but defer to SW  ## Behavioral / Environmental:  --  delirium precautions     ##Legal Status Patient with legal guardian  Thank you for this consult request. Recommendations have been communicated to the primary team.  We will continue to follow at this time.   Thedore Mins, MD  Total Time Spent in Direct Patient Care:  I personally spent 25 minutes on the unit in direct patient care. The direct patient care time included face-to-face time with the patient, reviewing the patient's chart, communicating with other professionals, and coordinating care. Greater than 50% of this time was spent in counseling or coordinating care with the patient regarding goals of hospitalization,  psycho-education, and discharge planning needs.   NEW history  Relevant Aspects of Hospital Course:  Admitted on 12/24/2021 for acute encephalopathy; has underlying rapidly progressive and likely terminal dementia.   Patient Report:  Patient unable to participate in interview.   Psychiatric History:  Information collected from chart. Bipolar affective disorder with psychotic features, MNCD Admitted to [facility] since Sep 06, 2021  Family psych history: Patient reports mental illness runs in her father's side of the family.   Social History:  Tobacco use: Half PPD; no use since admission Alcohol use: No use since admission Drug use: Denies; UDS negative on admission in May  Family History:  No pertinent FHx  Medical History: Past Medical History:  Diagnosis Date   Bipolar 1 disorder with moderate mania (HCC) 12/30/2013   Hypertension     Surgical History: History reviewed. No pertinent surgical history.  Medications:   Current Facility-Administered Medications:    (feeding supplement) PROSource Plus liquid 30 mL, 30 mL, Oral, BID BM, Danford, Earl Lites, MD, 30 mL at 12/28/22 1415   acetaminophen (TYLENOL) tablet 650 mg, 650 mg, Oral, Q6H PRN, Orland Mustard, MD, 650 mg at 12/17/22 2023   alum & mag hydroxide-simeth (MAALOX/MYLANTA) 200-200-20 MG/5ML suspension 30 mL, 30 mL, Oral, Q4H PRN, Orland Mustard, MD   bisacodyl (DULCOLAX) suppository 10 mg, 10 mg, Rectal, Daily PRN, Lanae Boast, MD  cyanocobalamin (VITAMIN B12) tablet 500 mcg, 500 mcg, Oral, Daily, Hazeline Junker B, MD, 500 mcg at 12/28/22 1104   enoxaparin (LOVENOX) injection 40 mg, 40 mg, Subcutaneous, Daily, Vann, Jessica U, DO, 40 mg at 12/28/22 1105   feeding supplement (ENSURE ENLIVE / ENSURE PLUS) liquid 237 mL, 237 mL, Oral, BID BM, Danford, Christopher P, MD, 237 mL at 12/28/22 1105   fluticasone (FLONASE) 50 MCG/ACT nasal spray 1 spray, 1 spray, Each Nare, Daily PRN, Orland Mustard, MD   hydrALAZINE  (APRESOLINE) injection 10 mg, 10 mg, Intravenous, Q4H PRN, Amin, Ankit C, MD   hydrocerin (EUCERIN) cream, , Topical, BID, Orland Mustard, MD, Given at 12/27/22 2350   ipratropium-albuterol (DUONEB) 0.5-2.5 (3) MG/3ML nebulizer solution 3 mL, 3 mL, Nebulization, Q4H PRN, Amin, Ankit C, MD   lactulose (CHRONULAC) 10 GM/15ML solution 20 g, 20 g, Oral, BID PRN, Alanda Slim, Taye T, MD   LORazepam (ATIVAN) 2 MG/ML concentrated solution 1 mg, 1 mg, Oral, TID, Mariel Craft, MD, 1 mg at 12/28/22 1106   multivitamin with minerals tablet 1 tablet, 1 tablet, Oral, Daily, Amin, Ankit C, MD, 1 tablet at 12/28/22 1104   OLANZapine zydis (ZYPREXA) disintegrating tablet 5 mg, 5 mg, Oral, BID, Augusto Gamble, MD, 5 mg at 12/28/22 1106   ondansetron (ZOFRAN) tablet 4 mg, 4 mg, Oral, Q8H PRN, Orland Mustard, MD, 4 mg at 03/17/22 0139   Oral care mouth rinse, 15 mL, Mouth Rinse, PRN, Leroy Sea, MD   polyethylene glycol (MIRALAX / GLYCOLAX) packet 17 g, 17 g, Oral, Daily, Pokhrel, Laxman, MD, 17 g at 12/28/22 1104   senna-docusate (Senokot-S) tablet 2 tablet, 2 tablet, Oral, QHS, Gherghe, Costin M, MD, 2 tablet at 12/27/22 2341   Tdap (BOOSTRIX) injection 0.5 mL, 0.5 mL, Intramuscular, Once, Vann, Jessica U, DO  Allergies: Allergies  Allergen Reactions   Penicillins Itching       Objective  Vital signs:  Temp:  [97.7 F (36.5 C)-98.6 F (37 C)] 98.6 F (37 C) (09/07 1052) Pulse Rate:  [59-95] 95 (09/07 1052) Resp:  [18] 18 (09/07 0513) BP: (90-125)/(70-107) 125/107 (09/07 1052) SpO2:  [100 %] 100 % (09/07 0513) Weight:  [44.6 kg] 44.6 kg (09/07 0513)  Psychiatric Specialty Exam: Psychiatric Specialty Exam  Presentation General Appearance: Bizarre; Disheveled  Eye Contact:Absent  Speech:-- (nonverbal)  Speech Volume:-- (nonverbal)  Handedness:Right   Mood and Affect  Mood:-- (nonverbal)  Affect:Flat   Thought Process  Thought Processes:-- (nonverbal)  Descriptions of  Associations:-- (nonverbal)  Orientation:None  Thought Content:-- (nonverbal)  Diagnosis of Schizophrenia or Schizoaffective disorder in past: No  Hallucinations:Hallucinations: -- (nonverbal)  Ideas of Reference:-- (nonverbal)  Suicidal Thoughts:Suicidal Thoughts: -- (nonverbal)  Homicidal Thoughts:Homicidal Thoughts: -- (nonverbal)   Sensorium  Memory:-- (nonverbal)  Judgment:Impaired  Insight:Lacking   Executive Functions  Concentration:Poor  Attention Span:Poor  Recall:-- (nonverbal)  Fund of Knowledge:-- (nonverbal)  Language:-- (nonverbal)   Psychomotor Activity  Psychomotor Activity:Psychomotor Activity: Restlessness   Assets  Assets:Resilience   Sleep  Sleep:Sleep: -- (nonverbal)    Physical Exam: Physical Exam Vitals reviewed.  Constitutional:      General: [name] not in acute distress.    Appearance: [name] not toxic-appearing.     Comments: thin  HENT:     Head: Normocephalic and atraumatic.  Pulmonary:     Effort: Pulmonary effort is normal.  Neurological:     General: No focal deficit present.     Blood pressure (!) 125/107, pulse 95, temperature 98.6 F (  37 C), temperature source Oral, resp. rate 18, height 5\' 6"  (1.676 m), weight 44.6 kg, SpO2 100%. Body mass index is 15.87 kg/m.

## 2022-12-28 NOTE — Progress Notes (Signed)
Progress Note   Patient: Natalie Brown LKG:[id number] DOB: [birth year] DOA: 12/24/2021     367 DOS: the patient was seen and examined on 12/28/2022   Brief hospital course: 57 y.o. 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    initially admitted to [facility] 08/2021 with agitation and aggressive behaviors.  Rx manic Bipolar, but after several months in [facility] noted significant cognitive impairment, and dementia was suspected.  Patient was observed to have significant short term memory impairment and worsening hygiene, MOCA 11/30 and CT head revealed advanced cerebral atrophy.     September 2023, patient was transferred for further evaluation and treatment due to concerns of new metabolic encephalopathy (that appear to have developed acutely over ~1 day).  Neurology and Psychiatry were consulted.  EEG was unremarkable.  Ammonia, TSH and RPR were normal.  MRI brain revealed no acute findings, but confirmed advanced atrophy.  Neurology suspected a particularly aggressive premature dementia.     Initially forgetful but redirectable and oriented, she is now abulic and essentially nonverbal (nursing note occasional staff or some family members can elicit more responses, but they are very limited).  Cannot bathe or toilet independently.  Patient holds food in the mouth, recent calorie count (7/26-7/29) was observed to eat 50% of meals or less.   Nursing report she does not swallow her pills regularly due to pocketing of pills or holding pills in the mouth for long periods.  In the last 6 months, she has had 22 kg weight loss and has become noticeably weaker and more prone to falls when attempting to walk (which she cannot do safely without assistance).   Palliative have been consulted and have met with family. Family are aware her dementia and weight loss are terminal.     Assessment and Plan: Early onset dementia   Bipolar Disorder Ammonia, TSH, RPR normal.  EEG normal.  MRI brain shows advanced atrophy.   Neurology/psychiatry feel condition clearly irreversible at the time of first presentation and work up for autoimmune, paraneoplastic or prion diseases would have been futile. Mother and sister aware of terminal condition. weaned off of Haldol over the last 4 months with no discernible change in behavior. - Full supportive care - Palliative care has seen 11/18/22--they are aware --I did clearly elicit from the Mother [name] that we are seeing adult failure to thrive, and that she is clearly Hospice ELigible.  Mother will think over this but seems resistant to the idea. - cont olanzapine 2.5 at bedtime at night  - Psychiatry following, continue 1mg  lorazepam twice daily. -- increased olanzapine 5mg  from at bedtime to bid per psych     Severe protein calorie malnutrition has lost close to 60 pounds since October of last year and continues to lose weight - Continue nutrition supplements, MVI as much as able - Appreciate Palliative Care and dietitian consultations  - Labs were not drawn despite me ordering them - Medical condition is progressive, irreversible and terminal, thus it was previously determined that tube feeds and CPR are futile and should not be offered.  Patient pulls out all IV lines, and there is a high degree of certainty that IV fluids would be difficult and futile as well.   Vitamin B12 Deficiency:   - Continue cyanocobalamin   Essential hypertension  BP normalized off medication   CKD Stage IIIa Cr stable   Physical Exam: BP (!) 138/92 (BP  Location: Left Arm)   Pulse 78   Temp 97.6 F (36.4 C) (Oral)   Resp 18   Ht 5\' 6"  (1.676 m)   Wt 45.8 kg   SpO2 92%   BMI 16.30 kg/m         Subjective: More awake, not communicative  Physical Exam: Vitals:   12/28/22 0513 12/28/22 1052 12/28/22 1717 12/28/22 1743  BP: 90/70 (!) 125/107  114/61  Pulse: (!) 59 95  76   Resp: 18   16  Temp: 97.7 F (36.5 C) 98.6 F (37 C) 98.3 F (36.8 C) 98.2 F (36.8 C)  TempSrc:  Oral Oral Oral  SpO2: 100%   100%  Weight: 44.6 kg     Height:       General exam: Conversant, in no acute distress Respiratory system: normal chest rise, clear, no audible wheezing Cardiovascular system: regular rhythm, s1-s2 Gastrointestinal system: Nondistended, nontender, pos BS Central nervous system: No seizures, no tremors Extremities: No cyanosis, no joint deformities Skin: No rashes, no pallor Psychiatry: Affect normal // no auditory hallucinations     Data Reviewed:  There are no new results to review at this time.  Family Communication: Pt in room, family not at bedside  Disposition: Status is: Inpatient Remains inpatient appropriate because: severity of illness  Planned Discharge Destination:  Unclear at this time    Author: Rickey Barbara, MD 12/28/2022 6:29 PM  For on call review [email or web address].

## 2022-12-29 NOTE — Plan of Care (Signed)

## 2022-12-29 NOTE — Progress Notes (Signed)
Progress Note   Patient: Natalie Brown:[id number] DOB: 1966/04/22 DOA: 12/24/2021     368 DOS: the patient was seen and examined on 12/29/2022   Brief hospital course: 57 y.o. 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    initially admitted to [facility] 08/2021 with agitation and aggressive behaviors.  Rx manic Bipolar, but after several months in [facility] noted significant cognitive impairment, and dementia was suspected.  Patient was observed to have significant short term memory impairment and worsening hygiene, MOCA 11/30 and CT head revealed advanced cerebral atrophy.     September 2023, patient was transferred for further evaluation and treatment due to concerns of new metabolic encephalopathy (that appear to have developed acutely over ~1 day).  Neurology and Psychiatry were consulted.  EEG was unremarkable.  Ammonia, TSH and RPR were normal.  MRI brain revealed no acute findings, but confirmed advanced atrophy.  Neurology suspected a particularly aggressive premature dementia.     Initially forgetful but redirectable and oriented, she is now abulic and essentially nonverbal (nursing note occasional staff or some family members can elicit more responses, but they are very limited).  Cannot bathe or toilet independently.  Patient holds food in the mouth, recent calorie count (7/26-7/29) was observed to eat 50% of meals or less.   Nursing report she does not swallow her pills regularly due to pocketing of pills or holding pills in the mouth for long periods.  In the last 6 months, she has had 22 kg weight loss and has become noticeably weaker and more prone to falls when attempting to walk (which she cannot do safely without assistance).   Palliative have been consulted and have met with family. Family are aware her dementia and weight loss are terminal.     Assessment and Plan: Early onset dementia   Bipolar Disorder Ammonia, TSH, RPR normal.  EEG normal.  MRI brain shows advanced atrophy.   Neurology/psychiatry feel condition clearly irreversible at the time of first presentation and work up for autoimmune, paraneoplastic or prion diseases would have been futile. Mother and sister aware of terminal condition. weaned off of Haldol over the last 4 months with no discernible change in behavior. - Full supportive care - Palliative care has seen 11/18/22 - cont olanzapine 2.5 at bedtime at night  - Psychiatry following, continue 1mg  lorazepam twice daily. -- recently ncreased olanzapine 5mg  from at bedtime to bid per psych -- Now much more alert, trying to climb out of bed.     Severe protein calorie malnutrition has lost close to 60 pounds since October of last year and continues to lose weight - Continue nutrition supplements, MVI as much as able - Appreciate Palliative Care and dietitian consultations  - Medical condition is progressive, irreversible and terminal, thus it was previously determined that tube feeds and CPR are futile and should not be offered.  Patient pulls out all IV lines, and there is a high degree of certainty that IV fluids would be difficult and futile as well.   Vitamin B12 Deficiency:   - Continue cyanocobalamin   Essential hypertension  BP normalized off medication   CKD Stage IIIa Cr stable        Subjective: Very alert this AM, has purposeful movements by grabbing at meal tray cover. Not very conversant  Physical Exam: Vitals:   12/28/22 1743 12/29/22 0516 12/29/22 0537 12/29/22 1052  BP: 114/61 (!) 70/46 [name])  89/67 104/75  Pulse: 76 64 96 97  Resp: 16 18    Temp: 98.2 F (36.8 C) (!) 97.3 F (36.3 C)  99.5 F (37.5 C)  TempSrc: Oral   Oral  SpO2: 100% 100%  100%  Weight:      Height:       General exam: Awake, laying in bed, in nad Respiratory system: Normal respiratory effort, no wheezing Cardiovascular system: regular rate, s1,  s2 Gastrointestinal system: Soft, nondistended, positive BS Central nervous system: CN2-12 grossly intact, strength intact Extremities: Perfused, no clubbing Skin: Normal skin turgor, no notable skin lesions seen Psychiatry: Unable to assess given mentation   Data Reviewed:  There are no new results to review at this time.  Family Communication: Pt in room, family not at bedside  Disposition: Status is: Inpatient Remains inpatient appropriate because: severity of illness  Planned Discharge Destination:  Unclear at this time    Author: Rickey Barbara, MD 12/29/2022 3:50 PM  For on call review [email or web address].

## 2022-12-30 NOTE — Consult Note (Signed)
[facility] Health Psychiatry Followup Face-to-Face Psychiatric Evaluation   Name: Natalie Brown DOB: 1966-02-19 MRN: [id number] Service Date: December 30, 2022 LOS:  LOS: 369 days  Reason for Consult: "Medication assistance-repetitive behaviors in the setting of severe catatonia previously-also somewhat agitated and requiring restraints-please assess and determine "  Referring Provider: Dr. Mahala Menghini   Assessment  Natalie Brown is a 57 y.o. female admitted medically for 12/24/2021 11:34 PM for possible catatonia/altered mental status. She carries the psychiatric diagnoses of bipolar affective disorder with psychotic symptoms and has a past medical history of hypertension.  This patient has had an unfortunate, rapid decline over the past several months to a year. She was initially admitted to the behavioral health hospital after decompensating with hx bipolar I disorder (was sleeping outside in the rain, confused, issues remaining on task) in 08/2021. Her psychosis and mania stabilized after about 4 weeks of admission w/ multiple medication trials (Zyprexa, haldol, and Invega). This admission revealed a significant and rapidly progressive dementia. She had a prolonged admission to behavioral health due to no safe discharge options and was transferred to the hospital in 12/2021 after 4 months after developing delirium/encephalopathy while admitted to [facility]. She has been admitted to [facility] since 12/2021 (now almost 1 year) and over that time has become almost totally nonverbal however has not been overtly manic or psychotic during that time span; dementia dx and sx has largely superceded these clinical features. Psychiatry consult team has been involved intermittently in her care over the last year (saw at admission 12/2021 when [facility] regimen continued, in 06/2022 (tapered off of haloperidol d/t rigidity/dystonia - attempted to transition to paliperidone however mother/guardian did not pick up the phone)) and  was re consulted by Dr. Mahala Menghini due to repetitive behaviors (rocking back and forth). Her family is mostly concerned that these behaviors are preventing her from eating and will lead to hastened death.  She had a remarkable response to low-dose ativan (began talking, eating, now walking again) and is now significantly improved after dose increase/optimization. Will continue to follow. .  Diagnoses:  Active Hospital problems: Principal Problem:   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) Active Problems:   Bipolar affective disorder, current episode manic with psychotic symptoms (HCC)   Tobacco abuse   Delirium due to multiple etiologies, acute, hyperactive   Sinus tachycardia   Altered mental state   B12 deficiency   COVID-19 virus infection   Drooling   Essential hypertension   Protein-calorie malnutrition, severe     Plan  ## Safety and Observation Level:  - Based on my clinical evaluation, I estimate the patient to be at moderate risk of self harm in the current setting of safety unawareness; defer management to primary team (pt in lap belt at time of eval)  ## Medications:  -- continue olanzapine oral-dissolvable tablet 5 mg from at bedtime to twice daily  - consider consolidating to at bedtime  -- continue lorazepam liquid formula 1 mg three times daily  For PRNs, would go with low potency antipsychotics (ie olanzapine) given prior poor response to high potency   -- prior propranolol d/c 03/2022, atropine dc 01/2022  ## Medical Decision Making Capacity:  Patient has interim legal guardian, mother [name] [phone]); clearly lacks capacity for most medical decisions.   ## Further Work-up:  -- most recent EKG on 11/23/2022  had QtC of 407 in setting of tachycardia  -- Pertinent labwork reviewed earlier this admission includes:  Labwork from last month  reviewed w/ increasing creatinine.  CMP pending  ## Disposition:  --Recommend memory care unit if possible  but defer to SW  ## Behavioral / Environmental:  --  delirium precautions     ##Legal Status Patient with legal guardian  Thank you for this consult request. Recommendations have been communicated to the primary team.  We will continue to follow at this time.   [name]  Total Time Spent in Direct Patient Care:  I personally spent 25 minutes on the unit in direct patient care. The direct patient care time included face-to-face time with the patient, reviewing the patient's chart, communicating with other professionals, and coordinating care. Greater than 50% of this time was spent in counseling or coordinating care with the patient regarding goals of hospitalization, psycho-education, and discharge planning needs.   NEW history  Relevant Aspects of Hospital Course:  Admitted on 12/24/2021 for acute encephalopathy; has underlying rapidly progressive and likely terminal dementia.   Patient Report:  Patient seen walking around the unit requiring significant assistance from boyfriend.  Later saw in the room-enjoying [employer], lollipops, etc.  She did not follow commands for me but was clearly interacting meaningfully with her environment and following commands from people she trusts (boyfriend requested a kiss and she kissed him).  She did attempt to say "hello" to me today and was able to read that it was Monday, September 9 but did not otherwise answer questions.  On physical exam there was no significant rigidity and I did not appreciate a grasp reflex.   Discussed medications with mother/legal guardian, sister and boyfriend-all parties agree that there has been significant improvement and to hold medications here.  Psychiatric History:  Information collected from chart. Bipolar affective disorder with psychotic features, MNCD Admitted to [facility] since Sep 06, 2021  Family psych history: Patient reports mental illness runs in her father's side of the family.   Social  History:  Tobacco use: Half PPD; no use since admission Alcohol use: No use since admission Drug use: Denies; UDS negative on admission in May  Family History:  No pertinent FHx  Medical History: Past Medical History:  Diagnosis Date   Bipolar 1 disorder with moderate mania (HCC) 12/30/2013   Hypertension     Surgical History: History reviewed. No pertinent surgical history.  Medications:   Current Facility-Administered Medications:    (feeding supplement) PROSource Plus liquid 30 mL, 30 mL, Oral, BID BM, Danford, Earl Lites, MD, 30 mL at 12/30/22 1124   acetaminophen (TYLENOL) tablet 650 mg, 650 mg, Oral, Q6H PRN, Orland Mustard, MD, 650 mg at 12/17/22 2023   alum & mag hydroxide-simeth (MAALOX/MYLANTA) 200-200-20 MG/5ML suspension 30 mL, 30 mL, Oral, Q4H PRN, Orland Mustard, MD   bisacodyl (DULCOLAX) suppository 10 mg, 10 mg, Rectal, Daily PRN, Kc, Ramesh, MD   cyanocobalamin (VITAMIN B12) tablet 500 mcg, 500 mcg, Oral, Daily, Hazeline Junker B, MD, 500 mcg at 12/30/22 1122   enoxaparin (LOVENOX) injection 40 mg, 40 mg, Subcutaneous, Daily, Vann, Jessica U, DO, 40 mg at 12/30/22 1126   feeding supplement (ENSURE ENLIVE / ENSURE PLUS) liquid 237 mL, 237 mL, Oral, BID BM, Danford, Christopher P, MD, 237 mL at 12/30/22 1125   fluticasone (FLONASE) 50 MCG/ACT nasal spray 1 spray, 1 spray, Each Nare, Daily PRN, Orland Mustard, MD   hydrALAZINE (APRESOLINE) injection 10 mg, 10 mg, Intravenous, Q4H PRN, Amin, Ankit C, MD   hydrocerin (EUCERIN) cream, , Topical, BID, Orland Mustard, MD, Given at 12/29/22 1105  ipratropium-albuterol (DUONEB) 0.5-2.5 (3) MG/3ML nebulizer solution 3 mL, 3 mL, Nebulization, Q4H PRN, Amin, Ankit C, MD   lactulose (CHRONULAC) 10 GM/15ML solution 20 g, 20 g, Oral, BID PRN, Alanda Slim, Taye T, MD   LORazepam (ATIVAN) 2 MG/ML concentrated solution 1 mg, 1 mg, Oral, TID, Mariel Craft, MD, 1 mg at 12/30/22 1121   multivitamin with minerals tablet 1 tablet, 1 tablet,  Oral, Daily, Amin, Ankit C, MD, 1 tablet at 12/30/22 1122   OLANZapine zydis (ZYPREXA) disintegrating tablet 5 mg, 5 mg, Oral, BID, Augusto Gamble, MD, 5 mg at 12/30/22 1122   ondansetron (ZOFRAN) tablet 4 mg, 4 mg, Oral, Q8H PRN, Orland Mustard, MD, 4 mg at 03/17/22 0139   Oral care mouth rinse, 15 mL, Mouth Rinse, PRN, Leroy Sea, MD   polyethylene glycol (MIRALAX / GLYCOLAX) packet 17 g, 17 g, Oral, Daily, Pokhrel, Laxman, MD, 17 g at 12/30/22 1125   senna-docusate (Senokot-S) tablet 2 tablet, 2 tablet, Oral, QHS, Gherghe, Costin M, MD, 2 tablet at 12/29/22 2250   Tdap (BOOSTRIX) injection 0.5 mL, 0.5 mL, Intramuscular, Once, Vann, Jessica U, DO  Allergies: Allergies  Allergen Reactions   Penicillins Itching       Objective  Vital signs:  Temp:  [99.5 F (37.5 C)] 99.5 F (37.5 C) (09/09 1119) Pulse Rate:  [70-91] 91 (09/09 1119) Resp:  [18] 18 (09/09 0518) BP: (141-142)/(62-92) 141/65 (09/09 1119) SpO2:  [99 %-100 %] 99 % (09/09 1119)  Psychiatric Specialty Exam: Psychiatric Specialty Exam  Hanaa Forester a 57 year old AA female, seen first walking unsteadily around  the unit and later in her bed. She appeared slightly younger than his stated age and appears malnourished. During the interview, she was less restless than previous and was attentive to her environment; she did occasionally regress into rocking behaviors. Was seen following commands for others but not psychiatry team. Attempting to vocalize a couple of times - not quite oriented (reading date) but answered question correctly. . Nonverbal again today.  She did not state a mood; impossible to judge insight/judgment/attention but assume lacking poor. She did not appear to be responding to internal stimuli.      Physical Exam: Physical Exam Vitals reviewed.  Constitutional:      General: [name] not in acute distress.    Appearance: [name] not toxic-appearing.     Comments: thin  HENT:     Head: Normocephalic  and atraumatic.  Pulmonary:     Effort: Pulmonary effort is normal.  Neurological:     General: No focal deficit present.     Blood pressure (!) 141/65, pulse 91, temperature 99.5 F (37.5 C), temperature source Oral, resp. rate 18, height 5\' 6"  (1.676 m), weight 44.6 kg, SpO2 99%. Body mass index is 15.87 kg/m.

## 2022-12-30 NOTE — Progress Notes (Signed)
Progress Note   Patient: Natalie Brown GNF:[id number] DOB: 31-Jul-1965 DOA: 12/24/2021     369 DOS: the patient was seen and examined on 12/30/2022   Brief hospital course: 57 y.o. 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    initially admitted to [facility] 08/2021 with agitation and aggressive behaviors.  Rx manic Bipolar, but after several months in [facility] noted significant cognitive impairment, and dementia was suspected.  Patient was observed to have significant short term memory impairment and worsening hygiene, MOCA 11/30 and CT head revealed advanced cerebral atrophy.     September 2023, patient was transferred for further evaluation and treatment due to concerns of new metabolic encephalopathy (that appear to have developed acutely over ~1 day).  Neurology and Psychiatry were consulted.  EEG was unremarkable.  Ammonia, TSH and RPR were normal.  MRI brain revealed no acute findings, but confirmed advanced atrophy.  Neurology suspected a particularly aggressive premature dementia.     Initially forgetful but redirectable and oriented, she is now abulic and essentially nonverbal (nursing note occasional staff or some family members can elicit more responses, but they are very limited).  Cannot bathe or toilet independently.  Patient holds food in the mouth, recent calorie count (7/26-7/29) was observed to eat 50% of meals or less.   Nursing report she does not swallow her pills regularly due to pocketing of pills or holding pills in the mouth for long periods.  In the last 6 months, she has had 22 kg weight loss and has become noticeably weaker and more prone to falls when attempting to walk (which she cannot do safely without assistance).   Palliative have been consulted and have met with family. Family are aware her dementia and weight loss are terminal.     Assessment and Plan: Early onset dementia   Bipolar Disorder Ammonia, TSH, RPR normal.  EEG normal.  MRI brain shows advanced atrophy.   Neurology/psychiatry feel condition clearly irreversible at the time of first presentation and work up for autoimmune, paraneoplastic or prion diseases would have been futile. Mother and sister aware of terminal condition. weaned off of Haldol over the last 4 months with no discernible change in behavior. - Full supportive care - Palliative care has seen 11/18/22 - cont olanzapine 2.5 at bedtime at night  - Psychiatry following, continue 1mg  lorazepam twice daily. -- recently ncreased olanzapine 5mg  from at bedtime to bid per psych -- Very alert this AM, purposeful movements today, reaching for objects on table. Staff reports pt was feeding herself this AM    Severe protein calorie malnutrition has lost close to 60 pounds since October of last year and continues to lose weight - Continue nutrition supplements, MVI as much as able - Appreciate Palliative Care and dietitian consultations  - Medical condition is progressive, irreversible and terminal, thus it was previously determined that tube feeds and CPR are futile and should not be offered.  Patient pulls out all IV lines, and there is a high degree of certainty that IV fluids would be difficult and futile as well.   Vitamin B12 Deficiency:   - Continue cyanocobalamin   Essential hypertension  BP normalized off medication   CKD Stage IIIa Most recent Cr had been stable Had been unable to obtain blood draw recently        Subjective: Very alert, sitting and rocking in bed, reaching for objects on table  Physical  Exam: Vitals:   12/29/22 1052 12/29/22 1848 12/30/22 0518 12/30/22 1119  BP: 104/75 (!) 142/92 (!) 141/62 (!) 141/65  Pulse: 97  70 91  Resp:   18   Temp: 99.5 F (37.5 C)   99.5 F (37.5 C)  TempSrc: Oral   Oral  SpO2: 100%  100% 99%  Weight:      Height:       General exam: Conversant, in no acute  distress Respiratory system: normal chest rise, clear, no audible wheezing Cardiovascular system: regular rhythm, s1-s2 Gastrointestinal system: Nondistended, nontender, pos BS Central nervous system: No seizures, no tremors Extremities: No cyanosis, no joint deformities Skin: No rashes, no pallor Psychiatry: Affect normal // no auditory hallucinations    Data Reviewed:  There are no new results to review at this time.  Family Communication: Pt in room, family not at bedside  Disposition: Status is: Inpatient Remains inpatient appropriate because: severity of illness  Planned Discharge Destination:  Unclear at this time    Author: Rickey Barbara, MD 12/30/2022 2:15 PM  For on call review [email or web address].

## 2022-12-31 NOTE — Plan of Care (Signed)

## 2022-12-31 NOTE — Progress Notes (Signed)
Progress Note   Patient: Natalie Brown ZOX:[id number] DOB: 26-Jun-1965 DOA: 12/24/2021     370 DOS: the patient was seen and examined on 12/31/2022   Brief hospital course: 57 y.o. 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    initially admitted to [facility] 08/2021 with agitation and aggressive behaviors.  Rx manic Bipolar, but after several months in [facility] noted significant cognitive impairment, and dementia was suspected.  Patient was observed to have significant short term memory impairment and worsening hygiene, MOCA 11/30 and CT head revealed advanced cerebral atrophy.     September 2023, patient was transferred for further evaluation and treatment due to concerns of new metabolic encephalopathy (that appear to have developed acutely over ~1 day).  Neurology and Psychiatry were consulted.  EEG was unremarkable.  Ammonia, TSH and RPR were normal.  MRI brain revealed no acute findings, but confirmed advanced atrophy.  Neurology suspected a particularly aggressive premature dementia.     Initially forgetful but redirectable and oriented, she is now abulic and essentially nonverbal (nursing note occasional staff or some family members can elicit more responses, but they are very limited).  Cannot bathe or toilet independently.  Patient holds food in the mouth, recent calorie count (7/26-7/29) was observed to eat 50% of meals or less.   Nursing report she does not swallow her pills regularly due to pocketing of pills or holding pills in the mouth for long periods.  In the last 6 months, she has had 22 kg weight loss and has become noticeably weaker and more prone to falls when attempting to walk (which she cannot do safely without assistance).   Palliative have been consulted and have met with family. Family are aware her dementia and weight loss are terminal.     Assessment and Plan: Early onset dementia   Bipolar Disorder Ammonia, TSH, RPR normal.  EEG normal.  MRI brain shows advanced atrophy.   Neurology/psychiatry feel condition clearly irreversible at the time of first presentation and work up for autoimmune, paraneoplastic or prion diseases would have been futile. Mother and sister aware of terminal condition. weaned off of Haldol over the last 4 months with no discernible change in behavior. - Full supportive care - Palliative care has seen 11/18/22 - cont olanzapine 2.5 at bedtime at night  - Psychiatry following, continue 1mg  lorazepam twice daily. -- recently ncreased olanzapine 5mg  from at bedtime to bid per psych -- Now much more alert, at times purposefully grabbing at objects on table    Severe protein calorie malnutrition has lost close to 60 pounds since October of last year and continues to lose weight - Continue nutrition supplements, MVI as much as able - Appreciate Palliative Care and dietitian consultations  - Medical condition is progressive, irreversible and terminal, thus it was previously determined that tube feeds and CPR are futile and should not be offered.  Patient pulls out all IV lines, and there is a high degree of certainty that IV fluids would be difficult and futile as well.   Vitamin B12 Deficiency:   - Continue cyanocobalamin   Essential hypertension  BP normalized off medication   CKD Stage IIIa Most recent Cr had been stable Had been unable to obtain blood draw recently        Subjective: Difficult to assess given mentation  Physical Exam: Vitals:   12/30/22 1119 12/30/22 1832 12/30/22 2046 12/31/22 0506  BP: [name])  141/65 (!) 151/72 94/74 130/74  Pulse: 91 100 93 86  Resp:  16 18 18   Temp: 99.5 F (37.5 C) 99.1 F (37.3 C) 98.2 F (36.8 C) 98.3 F (36.8 C)  TempSrc: Oral Oral    SpO2: 99% 99% 100% 100%  Weight:      Height:       General exam: laying in bed, in nad Respiratory system: Normal respiratory effort, no  wheezing Cardiovascular system: regular rate, s1, s2 Gastrointestinal system: Soft, nondistended, positive BS Central nervous system: CN2-12 grossly intact, strength intact Extremities: Perfused, no clubbing Skin: Normal skin turgor, no notable skin lesions seen Psychiatry:Difficult to assess given mentation   Data Reviewed:  There are no new results to review at this time.  Family Communication: Pt in room, family not at bedside  Disposition: Status is: Inpatient Remains inpatient appropriate because: severity of illness  Planned Discharge Destination:  Unclear at this time    Author: Rickey Barbara, MD 12/31/2022 12:48 PM  For on call review [email or web address].

## 2022-12-31 NOTE — TOC Progression Note (Addendum)
Transition of Care [facility]) - Progression Note    Patient Details  Name: Natalie Brown MRN: [id number] Date of Birth: 17-Oct-1965  [facility] of [facility] CM/SW Contact  Inis Sizer, LCSW Phone Number: 12/31/2022, 8:04 AM  Clinical Narrative:    1pm: CSW received call from Velna Hatchet at [facility] who states that the clinical team visited with patient yesterday at bedside to further evaluate if she was appropriate for SNF. Velna Hatchet states that due to patient's behavior the facility is unable to offer a bed.  8am: CSW continues to discuss patient with Electrical engineer.   CSW has not obtained a solid bed offer from [facility] yet as Presenter, broadcasting [facility] are working to determine if patient will be a LOG or DTP bed.  CSW will continue to follow for discharge planning purposes.     Barriers to Discharge: Financial Resources, Inadequate or no insurance, Homeless with medical needs  Expected Discharge Plan and Services In-house Referral: Clinical Social Work   Post Acute Care Choice: Nursing Home Living arrangements for the past 2 months: Homeless [facility]) Expected Discharge Date: 12/24/21                                     Social Determinants of Health (SDOH) Interventions SDOH Screenings   Food Insecurity: No Food Insecurity (03/05/2022)  Housing: High Risk (03/21/2022)  Alcohol Screen: Low Risk  (09/06/2021)  Depression (PHQ2-9): Medium Risk (05/26/2020)  Tobacco Use: High Risk (03/21/2022)    Readmission Risk Interventions     No data to display

## 2023-01-01 NOTE — Plan of Care (Signed)

## 2023-01-01 NOTE — Progress Notes (Signed)
PROGRESS NOTE    Natalie Brown  GUR:[id number] DOB: 11-03-1965 DOA: 12/24/2021 PCP: Lavinia Sharps, NP   Brief Narrative:  57 y.o. 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    initially admitted to [facility] 08/2021 with agitation and aggressive behaviors.  Rx manic Bipolar, but after several months in [facility] noted significant cognitive impairment, and dementia was suspected.  Patient was observed to have significant short term memory impairment and worsening hygiene, MOCA 11/30 and CT head revealed advanced cerebral atrophy.     September 2023, patient was transferred for further evaluation and treatment due to concerns of new metabolic encephalopathy (that appear to have developed acutely over ~1 day).  Neurology and Psychiatry were consulted.  EEG was unremarkable.  Ammonia, TSH and RPR were normal.  MRI brain revealed no acute findings, but confirmed advanced atrophy.  Neurology suspected a particularly aggressive premature dementia.     Initially forgetful but redirectable and oriented, she is now abulic and essentially nonverbal (nursing note occasional staff or some family members can elicit more responses, but they are very limited).  Cannot bathe or toilet independently.  Patient holds food in the mouth, recent calorie count (7/26-7/29) was observed to eat 50% of meals or less.   Nursing report she does not swallow her pills regularly due to pocketing of pills or holding pills in the mouth for long periods.  In the last 6 months, she has had 22 kg weight loss and has become noticeably weaker and more prone to falls when attempting to walk (which she cannot do safely without assistance).   Palliative have been consulted and have met with family. Family are aware her dementia and weight loss are terminal.   Assessment & Plan:   Principal Problem:   Early onset Dementia with behavioral disturbance  (HCC) Active Problems:   Delirium due to multiple etiologies, acute, hyperactive   Bipolar affective disorder, current episode manic with psychotic symptoms (HCC)   Sinus tachycardia   Tobacco abuse   Altered mental state   B12 deficiency   COVID-19 virus infection   Drooling   Essential hypertension   Protein-calorie malnutrition, severe  Early onset dementia  Bipolar Disorder Ammonia, TSH, RPR normal.  EEG normal.  MRI brain shows advanced atrophy.   Neurology/psychiatry feel condition clearly irreversible at the time of first presentation and work up for autoimmune, paraneoplastic or prion diseases would have been futile. Mother and sister aware of terminal condition. weaned off of Haldol over the last 4 months with no discernible change in behavior. - Full supportive care - Palliative care has seen 11/18/22 - cont olanzapine 2.5 at bedtime at night  - Psychiatry following, continue 1mg  lorazepam twice daily. -- recently ncreased olanzapine 5mg  from at bedtime to bid per psych.    Severe protein calorie malnutrition has lost close to 60 pounds since October of last year and continues to lose weight - Continue nutrition supplements, MVI as much as able - Appreciate Palliative Care and dietitian consultations  - Medical condition is progressive, irreversible and terminal, thus it was previously determined that tube feeds and CPR are futile and should not be offered.  Patient pulls out all IV lines, and there is a high degree of certainty that IV fluids would be difficult and futile as well.   Vitamin B12 Deficiency:   - Continue cyanocobalamin   Essential hypertension  BP normalized off medication  CKD Stage IIIa Most recent Cr had been stable Had been unable to obtain blood draw recently    DVT prophylaxis: enoxaparin (LOVENOX) injection 40 mg Start: 11/07/22 1000   Code Status: Do not attempt resuscitation (DNR) - Comfort care  Family Communication:  None present at  bedside.  Status is: Inpatient Remains inpatient appropriate because: Unsafe disposition as of now.   Estimated body mass index is 15.87 kg/m as calculated from the following:   Height as of this encounter: 5\' 6"  (1.676 m).   Weight as of this encounter: 44.6 kg.    Nutritional Assessment: Body mass index is 15.87 kg/m.[name] Seen by dietician.  I agree with the assessment and plan as outlined below: Nutrition Status: Nutrition Problem: Severe Malnutrition Etiology: chronic illness (advanced dementia) Signs/Symptoms: energy intake < 75% for > or equal to 1 month, percent weight loss Percent weight loss: 15 % (in 3 months) Interventions: Calorie Count, Ensure Enlive (each supplement provides 350kcal and 20 grams of protein), MVI  . Skin Assessment: I have examined the patient's skin and I agree with the wound assessment as performed by the wound care RN as outlined below:    Consultants:  None  Procedures:  None  Antimicrobials:  Anti-infectives (From admission, onward)    None         Subjective: Patient seen and examined.  [name] alert but incoherent at her baseline.  Keeps moving in the bed.  Objective: Vitals:   12/30/22 1119 12/30/22 1832 12/30/22 2046 12/31/22 0506  BP: (!) 141/65 (!) 151/72 94/74 130/74  Pulse: 91 100 93 86  Resp:  16 18 18   Temp: 99.5 F (37.5 C) 99.1 F (37.3 C) 98.2 F (36.8 C) 98.3 F (36.8 C)  TempSrc: Oral Oral    SpO2: 99% 99% 100% 100%  Weight:      Height:       No intake or output data in the 24 hours ending 01/01/23 0838 Filed Weights   12/17/22 0518 12/27/22 0545 12/28/22 0513  Weight: 45.8 kg 45.6 kg 44.6 kg    Examination:  General exam: Appears calm and comfortable  Respiratory system: Clear to auscultation. Respiratory effort normal. Cardiovascular system: S1 & S2 heard, RRR. No JVD, murmurs, rubs, gallops or clicks. No pedal edema. Gastrointestinal system: Abdomen is nondistended, soft and nontender. No  organomegaly or masses felt. Normal bowel sounds heard. Central nervous system: Alert but not oriented.   Data Reviewed: I have personally reviewed following labs and imaging studies  CBC: No results for input(s): "WBC", "NEUTROABS", "HGB", "HCT", "MCV", "PLT" in the last 168 hours. Basic Metabolic Panel: No results for input(s): "NA", "K", "CL", "CO2", "GLUCOSE", "BUN", "CREATININE", "CALCIUM", "MG", "PHOS" in the last 168 hours. GFR: Estimated Creatinine Clearance: 43.9 mL/min (A) (by C-G formula based on SCr of 1.02 mg/dL (H)). Liver Function Tests: No results for input(s): "AST", "ALT", "ALKPHOS", "BILITOT", "PROT", "ALBUMIN" in the last 168 hours. No results for input(s): "LIPASE", "AMYLASE" in the last 168 hours. No results for input(s): "AMMONIA" in the last 168 hours. Coagulation Profile: No results for input(s): "INR", "PROTIME" in the last 168 hours. Cardiac Enzymes: No results for input(s): "CKTOTAL", "CKMB", "CKMBINDEX", "TROPONINI" in the last 168 hours. BNP (last 3 results) No results for input(s): "PROBNP" in the last 8760 hours. HbA1C: No results for input(s): "HGBA1C" in the last 72 hours. CBG: No results for input(s): "GLUCAP" in the last 168 hours. Lipid Profile: No results for input(s): "CHOL", "HDL", "LDLCALC", "TRIG", "CHOLHDL", "  LDLDIRECT" in the last 72 hours. Thyroid Function Tests: No results for input(s): "TSH", "T4TOTAL", "FREET4", "T3FREE", "THYROIDAB" in the last 72 hours. Anemia Panel: No results for input(s): "VITAMINB12", "FOLATE", "FERRITIN", "TIBC", "IRON", "RETICCTPCT" in the last 72 hours. Sepsis Labs: No results for input(s): "PROCALCITON", "LATICACIDVEN" in the last 168 hours.  No results found for this or any previous visit (from the past 240 hour(s)).   Radiology Studies: No results found.  Scheduled Meds:  (feeding supplement) PROSource Plus  30 mL Oral BID BM   vitamin B-12  500 mcg Oral Daily   enoxaparin (LOVENOX) injection  40  mg Subcutaneous Daily   feeding supplement  237 mL Oral BID BM   hydrocerin   Topical BID   LORazepam  1 mg Oral TID   multivitamin with minerals  1 tablet Oral Daily   OLANZapine zydis  5 mg Oral BID   polyethylene glycol  17 g Oral Daily   senna-docusate  2 tablet Oral QHS   Tdap  0.5 mL Intramuscular Once   Continuous Infusions:   LOS: 371 days   Hughie Closs, MD [facility]  01/01/2023, 8:38 AM   *Please note that this is a verbal dictation therefore any spelling or grammatical errors are due to the "Dragon Medical One" system interpretation.  Please page via Amion and do not message via secure chat for urgent patient care matters. Secure chat can be used for non urgent patient care matters.  How to contact the [facility] Attending or Consulting provider 7A - 7P or covering provider during after hours 7P -7A, for this patient?  Check the care team in [facility] and look for a) attending/consulting TR[facility] provider listed and b) the [facility] team listed. Page or secure chat 7A-7P. Log into [email or web address] and use [facility]'s universal password to access. If you do not have the password, please contact the hospital operator. Locate the [facility] provider you are looking for under [facility] and page to a number that you can be directly reached. If you still have difficulty reaching the provider, please page the [facility] (Director on Call) for the Hospitalists listed on amion for assistance.

## 2023-01-01 NOTE — Consult Note (Signed)
[facility] Psychiatry Followup Face-to-Face Psychiatric Evaluation   Name: Natalie Brown DOB: 12/24/1965 MRN: [id number] Service Date: January 01, 2023 LOS:  LOS: 371 days  Reason for Consult: "Medication assistance-repetitive behaviors in the setting of severe catatonia previously-also somewhat agitated and requiring restraints-please assess and determine "  Referring Provider: Dr. Mahala Menghini   Assessment  Natalie Brown is a 57 y.o. female admitted medically for 12/24/2021 11:34 PM for possible catatonia/altered mental status. She carries the psychiatric diagnoses of bipolar affective disorder with psychotic symptoms and has a past medical history of hypertension.  This patient has had an unfortunate, rapid decline over the past several months to a year. She was initially admitted to the behavioral health hospital after decompensating with hx bipolar I disorder (was sleeping outside in the rain, confused, issues remaining on task) in 08/2021. Her psychosis and mania stabilized after about 4 weeks of admission w/ multiple medication trials (Zyprexa, haldol, and Invega). This admission revealed a significant and rapidly progressive dementia. She had a prolonged admission to behavioral health due to no safe discharge options and was transferred to the hospital in 12/2021 after 4 months after developing delirium/encephalopathy while admitted to [facility]. She has been admitted to [facility] since 12/2021 (now almost 1 year) and over that time has become almost totally nonverbal however has not been overtly manic or psychotic during that time span; dementia dx and sx has largely superceded these clinical features. Psychiatry consult team has been involved intermittently in her care over the last year (saw at admission 12/2021 when [facility] regimen continued, in 06/2022 (tapered off of haloperidol d/t rigidity/dystonia - attempted to transition to paliperidone however mother/guardian did not pick up the phone))  and was re consulted by Dr. Mahala Menghini due to repetitive behaviors (rocking back and forth). Her family is mostly concerned that these behaviors are preventing her from eating and will lead to hastened death.  She had a remarkable response to low-dose ativan (began talking, eating, now walking again) and is now significantly improved after dose increase/optimization, although mostly responding to family > hospital staff (as is expected in pt with advanced dementia). Will sign off  Diagnoses:  Active Hospital problems: Principal Problem:   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) Active Problems:   Bipolar affective disorder, current episode manic with psychotic symptoms (HCC)   Tobacco abuse   Delirium due to multiple etiologies, acute, hyperactive   Sinus tachycardia   Altered mental state   B12 deficiency   COVID-19 virus infection   Drooling   Essential hypertension   Protein-calorie malnutrition, severe     Plan  ## Safety and Observation Level:  - Based on my clinical evaluation, I estimate the patient to be at moderate risk of self harm in the current setting of safety unawareness; defer management to primary team (pt in lap belt at time of eval)  ## Medications:  -- continue olanzapine oral-dissolvable tablet 5 mg from at bedtime to twice daily  - consider consolidating to at bedtime  -- continue lorazepam liquid formula 1 mg three times daily  For PRNs, would go with low potency antipsychotics (ie olanzapine) given prior poor response to high potency   -- prior propranolol d/c 03/2022, atropine dc 01/2022  ## Medical Decision Making Capacity:  Patient has interim legal guardian, mother Natalie Brown [phone]); clearly lacks capacity for most medical decisions.   ## Further Work-up:  -- most recent EKG on 11/23/2022  had QtC of 407 in setting of tachycardia  --  Pertinent labwork reviewed earlier this admission includes:  Labwork from last month reviewed w/  increasing creatinine.  CMP pending  ## Disposition:  --Recommend memory care unit if possible but defer to SW  ## Behavioral / Environmental:  --  delirium precautions     ##Legal Status Patient with legal guardian  Thank you for this consult request. Recommendations have been communicated to the primary team.  We will sign off at this time.   Natalie Brown A Natalie Brown   NEW history  Relevant Aspects of Hospital Course:  Admitted on 12/24/2021 for acute encephalopathy; has underlying rapidly progressive and likely terminal dementia.   Patient Report:  Patient seen in bed. She remains more interactive than first eval - makes eye contact, tracks author through the room, attempts to follow some commands. She did not vocalize on assessment today. Remains with no rigidity on exam.   Called mom - improvements she have seen have been sustained on this medication regimen (is talking to, eating for family). Discussed that psych will sign off as no further med changes anticipated (we are balancing treating catatonic behaviors w/ not insignificanct fall risk, have discussed several times) - mom's main concern was that medications would stop; assured her no changes would be made and we would see Natalie Brown again if there was any future decompensation.    Psychiatric History:  Information collected from chart. Bipolar affective disorder with psychotic features, MNCD Admitted to [facility] since Sep 06, 2021  Family psych history: Patient reports mental illness runs in her father's side of the family.   Social History:  Tobacco use: Half PPD; no use since admission Alcohol use: No use since admission Drug use: Denies; UDS negative on admission in May  Family History:  No pertinent FHx  Medical History: Past Medical History:  Diagnosis Date   Bipolar 1 disorder with moderate mania (HCC) 12/30/2013   Hypertension     Surgical History: History reviewed. No pertinent surgical  history.  Medications:   Current Facility-Administered Medications:    (feeding supplement) PROSource Plus liquid 30 mL, 30 mL, Oral, BID BM, Danford, Earl Lites, MD, 30 mL at 01/01/23 1127   acetaminophen (TYLENOL) tablet 650 mg, 650 mg, Oral, Q6H PRN, Orland Mustard, MD, 650 mg at 12/17/22 2023   alum & mag hydroxide-simeth (MAALOX/MYLANTA) 200-200-20 MG/5ML suspension 30 mL, 30 mL, Oral, Q4H PRN, Orland Mustard, MD   bisacodyl (DULCOLAX) suppository 10 mg, 10 mg, Rectal, Daily PRN, Kc, Ramesh, MD   cyanocobalamin (VITAMIN B12) tablet 500 mcg, 500 mcg, Oral, Daily, Hazeline Junker B, MD, 500 mcg at 01/01/23 1126   enoxaparin (LOVENOX) injection 40 mg, 40 mg, Subcutaneous, Daily, Vann, Jessica U, DO, 40 mg at 01/01/23 1127   feeding supplement (ENSURE ENLIVE / ENSURE PLUS) liquid 237 mL, 237 mL, Oral, BID BM, Danford, Christopher P, MD, 237 mL at 01/01/23 1128   fluticasone (FLONASE) 50 MCG/ACT nasal spray 1 spray, 1 spray, Each Nare, Daily PRN, Orland Mustard, MD   hydrALAZINE (APRESOLINE) injection 10 mg, 10 mg, Intravenous, Q4H PRN, Amin, Ankit C, MD   hydrocerin (EUCERIN) cream, , Topical, BID, Orland Mustard, MD, Given at 01/01/23 1128   ipratropium-albuterol (DUONEB) 0.5-2.5 (3) MG/3ML nebulizer solution 3 mL, 3 mL, Nebulization, Q4H PRN, Amin, Ankit C, MD   lactulose (CHRONULAC) 10 GM/15ML solution 20 g, 20 g, Oral, BID PRN, Alanda Slim, Taye T, MD   LORazepam (ATIVAN) 2 MG/ML concentrated solution 1 mg, 1 mg, Oral, TID, Mariel Craft, MD, 1 mg at 01/01/23  1127   multivitamin with minerals tablet 1 tablet, 1 tablet, Oral, Daily, Amin, Ankit C, MD, 1 tablet at 01/01/23 1127   OLANZapine zydis (ZYPREXA) disintegrating tablet 5 mg, 5 mg, Oral, BID, Augusto Gamble, MD, 5 mg at 01/01/23 1127   ondansetron (ZOFRAN) tablet 4 mg, 4 mg, Oral, Q8H PRN, Orland Mustard, MD, 4 mg at 03/17/22 0139   Oral care mouth rinse, 15 mL, Mouth Rinse, PRN, Leroy Sea, MD   polyethylene glycol (MIRALAX /  GLYCOLAX) packet 17 g, 17 g, Oral, Daily, Pokhrel, Laxman, MD, 17 g at 12/30/22 1125   senna-docusate (Senokot-S) tablet 2 tablet, 2 tablet, Oral, QHS, Gherghe, Daylene Katayama, MD, 2 tablet at 12/30/22 2301   Tdap (BOOSTRIX) injection 0.5 mL, 0.5 mL, Intramuscular, Once, Marlin Canary U, DO  Allergies: Allergies  Allergen Reactions   Penicillins Itching       Objective  Vital signs:     Psychiatric Specialty Exam: Psychiatric Specialty Exam  Careli Hohenstein a 57 year old AA female, in her bed. She appeared slightly younger than his stated age and appears malnourished. During the interview, she was less restless than previous and was attentive to her environment; she did occasionally regress into rocking behaviors (more frequent today than yesterday). Attempted to engage in interview, followed some commands. Did not attempt to vocalize; Nonverbal again today.  She did not state a mood; impossible to judge insight/judgment/attention/orientation but assume lacking poor. She did not appear to be responding to internal stimuli.      Physical Exam: Physical Exam Vitals reviewed.  Constitutional:      General: [name] not in acute distress.    Appearance: [name] not toxic-appearing.     Comments: thin  HENT:     Head: Normocephalic and atraumatic.  Pulmonary:     Effort: Pulmonary effort is normal.  Neurological:     General: No focal deficit present.     Blood pressure 130/74, pulse 86, temperature 98.3 F (36.8 C), resp. rate 18, height 5\' 6"  (1.676 m), weight 44.6 kg, SpO2 100%. Body mass index is 15.87 kg/m.

## 2023-01-01 NOTE — Plan of Care (Signed)
  Problem: Clinical Measurements: Goal: Diagnostic test results will improve Outcome: Progressing Goal: Respiratory complications will improve Outcome: Progressing Goal: Cardiovascular complication will be avoided Outcome: Progressing   Problem: Activity: Goal: Risk for activity intolerance will decrease Outcome: Progressing   Problem: Nutrition: Goal: Adequate nutrition will be maintained Outcome: Progressing   Problem: Coping: Goal: Level of anxiety will decrease Outcome: Progressing   Problem: Elimination: Goal: Will not experience complications related to bowel motility Outcome: Progressing Goal: Will not experience complications related to urinary retention Outcome: Progressing   Problem: Pain Managment: Goal: General experience of comfort will improve Outcome: Progressing   Problem: Skin Integrity: Goal: Risk for impaired skin integrity will decrease Outcome: Progressing

## 2023-01-01 NOTE — Plan of Care (Signed)
  Problem: Activity: Goal: Risk for activity intolerance will decrease Outcome: Progressing   Problem: Elimination: Goal: Will not experience complications related to bowel motility Outcome: Progressing   Problem: Pain Managment: Goal: General experience of comfort will improve Outcome: Progressing   Problem: Safety: Goal: Ability to remain free from injury will improve Outcome: Progressing   Problem: Skin Integrity: Goal: Risk for impaired skin integrity will decrease Outcome: Progressing   

## 2023-01-01 NOTE — Progress Notes (Signed)
Patient ID: Natalie Brown, female   DOB: [birth year], 57 y.o.   MRN: [id number]    Progress Note from the Palliative Medicine Team at [facility]   Patient Name: Natalie Brown        Date: 01/01/2023 DOB: 05-Apr-1966  Age: 57 y.o. MRN#: [id number] Attending Physician: Hughie Closs, MD Primary Care Physician: Lavinia Sharps, NP Admit Date: 12/24/2021    Extensive chart review has been completed prior to meeting with patient/family  including labs, vital signs, imaging, progress/consult notes, orders, medications and available advance directive documents.   57 y.o. female   admitted on 12/24/2021 medically for 12/26/2021  1:58 AM for possible catatonia/altered mental status. She carries the psychiatric diagnoses of bipolar affective disorder with psychotic symptoms and has a past medical history of hypertension.  I met with this family initially on July 29,2024   At that time patient's family reported dense psychiatric history going back 20 years.   Patient initially was admitted to behavioral health where she was initially admitted with mania, dementia diagnosis was prescribed at that time.  IMPRESSION:   MRI 12/24/2021 1. No acute intracranial abnormality. 2. Advanced cerebral and cerebellar atrophy.    Patient's alertness/'s cognitive state continues to  wax and wane day-to-day.    Patient continues to fail to thrive.  Poor po intake. Patient does not have medical decision-making capacity.   Today patient is nonverbal and unable to follow commands for me.  She appears comfortable.   No family at bedside  Transition of care continue to work on disposition  plan   Family face ongoing  treatment option decisions, advanced directive decisions and anticipatory care needs.  Many family meetings with mother who is patient's main decision make.  Current plan of care as discussed with mother  -DNR DNI -No artificial feeding or hydration now in the future -Diet as tolerated with  assistance with feeding -Hopeful for placement for long-term care  Unfortunate situation, difficult transition of care, limited options.  PMT will will sign off at this time, please reconsult if we can be of any assistance in the future.   Lorinda Creed NP  Palliative Medicine Team Team Phone # [phone] Pager [phone]

## 2023-01-01 NOTE — TOC Progression Note (Signed)
Transition of Care [facility]) - Progression Note    Patient Details  Name: Natalie Brown MRN: [id number] Date of Birth: December 30, 1965  Transition of [facility]) CM/SW Contact  Inis Sizer, LCSW Phone Number: 01/01/2023, 2:33 PM  Clinical Narrative:    CSW spoke with patient's mother [name] to discuss the possibility of the patient discharging to her apartment. [name] states she currently resides in a one bedroom apartment that is very small but she will speak with the leasing office to determine if there is any possibility for her to relocate to a two bedroom apartment to accommodate herself and the patient.  CSW will continue to follow for discharge planning purposes.     Barriers to Discharge: Financial Resources, Inadequate or no insurance, Homeless with medical needs  Expected Discharge Plan and Services In-house Referral: Clinical Social Work   Post Acute Care Choice: Nursing Home Living arrangements for the past 2 months: Homeless [facility]) Expected Discharge Date: 12/24/21                                     Social Determinants of Health (SDOH) Interventions SDOH Screenings   Food Insecurity: No Food Insecurity (03/05/2022)  Housing: High Risk (03/21/2022)  Alcohol Screen: Low Risk  (09/06/2021)  Depression (PHQ2-9): Medium Risk (05/26/2020)  Tobacco Use: High Risk (03/21/2022)    Readmission Risk Interventions     No data to display

## 2023-01-02 LAB — BASIC METABOLIC PANEL
Anion gap: 9 (ref 5–15)
BUN: 17 mg/dL (ref 6–20)
CO2: 27 mmol/L (ref 22–32)
Calcium: 9.1 mg/dL (ref 8.9–10.3)
Chloride: 106 mmol/L (ref 98–111)
Creatinine, Ser: 1.13 mg/dL — ABNORMAL HIGH (ref 0.44–1.00)
GFR, Estimated: 57 mL/min — ABNORMAL LOW (ref >60)
Glucose, Bld: 80 mg/dL (ref 70–99)
Potassium: 5.2 mmol/L — ABNORMAL HIGH (ref 3.5–5.1)
Sodium: 142 mmol/L (ref 135–145)

## 2023-01-02 MED ORDER — SODIUM ZIRCONIUM CYCLOSILICATE 10 G PO PACK
10.0000 g | PACK | Freq: Every day | ORAL | Status: DC
  Administered 2023-01-02 – 2023-01-06 (×5): 10 g via ORAL
  Filled 2023-01-02 (×5): qty 1

## 2023-01-02 MED ORDER — LORAZEPAM 2 MG/ML PO CONC: 1.0000 mg | Freq: Once | ORAL | Status: DC

## 2023-01-02 MED ORDER — LORAZEPAM 1 MG PO TABS
1.0000 mg | ORAL_TABLET | Freq: Four times a day (QID) | ORAL | Status: DC | PRN
  Administered 2023-01-07 – 2023-01-22 (×5): 1 mg via ORAL
  Filled 2023-01-02 (×5): qty 1

## 2023-01-02 MED ORDER — OLANZAPINE 5 MG PO TBDP
7.5000 mg | ORAL_TABLET | Freq: Two times a day (BID) | ORAL | Status: DC
  Administered 2023-01-02 – 2023-01-03 (×2): 7.5 mg via ORAL
  Filled 2023-01-02 (×2): qty 2

## 2023-01-02 NOTE — Plan of Care (Signed)

## 2023-01-02 NOTE — Progress Notes (Signed)
PROGRESS NOTE    Natalie Brown  URK:[id number] DOB: 04-Apr-1966 DOA: 12/24/2021 PCP: Lavinia Sharps, NP   Brief Narrative:  57 y.o. 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    initially admitted to [facility] 08/2021 with agitation and aggressive behaviors.  Rx manic Bipolar, but after several months in [facility] noted significant cognitive impairment, and dementia was suspected.  Patient was observed to have significant short term memory impairment and worsening hygiene, MOCA 11/30 and CT head revealed advanced cerebral atrophy.     September 2023, patient was transferred for further evaluation and treatment due to concerns of new metabolic encephalopathy (that appear to have developed acutely over ~1 day).  Neurology and Psychiatry were consulted.  EEG was unremarkable.  Ammonia, TSH and RPR were normal.  MRI brain revealed no acute findings, but confirmed advanced atrophy.  Neurology suspected a particularly aggressive premature dementia.     Initially forgetful but redirectable and oriented, she is now abulic and essentially nonverbal (nursing note occasional staff or some family members can elicit more responses, but they are very limited).  Cannot bathe or toilet independently.  Patient holds food in the mouth, recent calorie count (7/26-7/29) was observed to eat 50% of meals or less.   Nursing report she does not swallow her pills regularly due to pocketing of pills or holding pills in the mouth for long periods.  In the last 6 months, she has had 22 kg weight loss and has become noticeably weaker and more prone to falls when attempting to walk (which she cannot do safely without assistance).   Palliative have been consulted and have met with family. Family are aware her dementia and weight loss are terminal.   Assessment & Plan:   Principal Problem:   Early onset Dementia with behavioral disturbance  (HCC) Active Problems:   Delirium due to multiple etiologies, acute, hyperactive   Bipolar affective disorder, current episode manic with psychotic symptoms (HCC)   Sinus tachycardia   Tobacco abuse   Altered mental state   B12 deficiency   COVID-19 virus infection   Drooling   Essential hypertension   Protein-calorie malnutrition, severe  Early onset dementia  Bipolar Disorder Ammonia, TSH, RPR normal.  EEG normal.  MRI brain shows advanced atrophy.   Neurology/psychiatry feel condition clearly irreversible at the time of first presentation and work up for autoimmune, paraneoplastic or prion diseases would have been futile. Mother and sister aware of terminal condition. weaned off of Haldol over the last 4 months with no discernible change in behavior. - Full supportive care - Palliative care has seen 11/18/22 - cont olanzapine 5 mg oral twice daily (consider consolidating to at bedtime ) and lorazepam 1 mg 3 times daily and 1 mg every 6 hours as needed for agitation per psychiatry recommendations. - Psychiatry signed off on 01/01/2023. --     Severe protein calorie malnutrition has lost close to 60 pounds since October of last year and continues to lose weight - Continue nutrition supplements, MVI as much as able - Appreciate Palliative Care and dietitian consultations  - Medical condition is progressive, irreversible and terminal, thus it was previously determined that tube feeds and CPR are futile and should not be offered.  Patient pulls out all IV lines, and there is a high degree of certainty that IV fluids would be difficult and futile as well.   Vitamin B12 Deficiency:   -  Continue cyanocobalamin   Essential hypertension  BP normalized off medication   CKD Stage IIIa Most recent Cr had been stable Had been unable to obtain blood draw recently   Hyperkalemia: I noticed that the last BMP was done on 12/24/2022 with potassium of 5.2.  Will recheck BMP today.  DVT  prophylaxis: enoxaparin (LOVENOX) injection 40 mg Start: 11/07/22 1000   Code Status: Do not attempt resuscitation (DNR) - Comfort care  Family Communication:  None present at bedside.  Status is: Inpatient Remains inpatient appropriate because: Unsafe disposition as of now.   Estimated body mass index is 15.87 kg/m as calculated from the following:   Height as of this encounter: 5\' 6"  (1.676 m).   Weight as of this encounter: 44.6 kg.    Nutritional Assessment: Body mass index is 15.87 kg/m.[name] Seen by dietician.  I agree with the assessment and plan as outlined below: Nutrition Status: Nutrition Problem: Severe Malnutrition Etiology: chronic illness (advanced dementia) Signs/Symptoms: energy intake < 75% for > or equal to 1 month, percent weight loss Percent weight loss: 15 % (in 3 months) Interventions: Calorie Count, Ensure Enlive (each supplement provides 350kcal and 20 grams of protein), MVI  . Skin Assessment: I have examined the patient's skin and I agree with the wound assessment as performed by the wound care RN as outlined below:    Consultants:  None  Procedures:  None  Antimicrobials:  Anti-infectives (From admission, onward)    None         Subjective: Seen and examined.  Patient sleeping.  Objective: Vitals:   12/31/22 0506 01/01/23 1959 01/02/23 0350 01/02/23 0427  BP: 130/74 (!) 164/129 96/70 110/71  Pulse: 86 (!) 39 84 69  Resp: 18 18 18 20   Temp: 98.3 F (36.8 C) 98.2 F (36.8 C) 97.7 F (36.5 C) 97.6 F (36.4 C)  TempSrc:  Oral Axillary Oral  SpO2: 100% (!) 82% 100% 100%  Weight:      Height:        Intake/Output Summary (Last 24 hours) at 01/02/2023 1007 Last data filed at 01/02/2023 0244 Gross per 24 hour  Intake 800 ml  Output --  Net 800 ml   Filed Weights   12/17/22 0518 12/27/22 0545 12/28/22 0513  Weight: 45.8 kg 45.6 kg 44.6 kg    Examination:  General exam: Appears calm and comfortable    Data Reviewed: I have  personally reviewed following labs and imaging studies  CBC: No results for input(s): "WBC", "NEUTROABS", "HGB", "HCT", "MCV", "PLT" in the last 168 hours. Basic Metabolic Panel: No results for input(s): "NA", "K", "CL", "CO2", "GLUCOSE", "BUN", "CREATININE", "CALCIUM", "MG", "PHOS" in the last 168 hours. GFR: Estimated Creatinine Clearance: 43.9 mL/min (A) (by C-G formula based on SCr of 1.02 mg/dL (H)). Liver Function Tests: No results for input(s): "AST", "ALT", "ALKPHOS", "BILITOT", "PROT", "ALBUMIN" in the last 168 hours. No results for input(s): "LIPASE", "AMYLASE" in the last 168 hours. No results for input(s): "AMMONIA" in the last 168 hours. Coagulation Profile: No results for input(s): "INR", "PROTIME" in the last 168 hours. Cardiac Enzymes: No results for input(s): "CKTOTAL", "CKMB", "CKMBINDEX", "TROPONINI" in the last 168 hours. BNP (last 3 results) No results for input(s): "PROBNP" in the last 8760 hours. HbA1C: No results for input(s): "HGBA1C" in the last 72 hours. CBG: No results for input(s): "GLUCAP" in the last 168 hours. Lipid Profile: No results for input(s): "CHOL", "HDL", "LDLCALC", "TRIG", "CHOLHDL", "LDLDIRECT" in the last 72 hours. Thyroid Function  Tests: No results for input(s): "TSH", "T4TOTAL", "FREET4", "T3FREE", "THYROIDAB" in the last 72 hours. Anemia Panel: No results for input(s): "VITAMINB12", "FOLATE", "FERRITIN", "TIBC", "IRON", "RETICCTPCT" in the last 72 hours. Sepsis Labs: No results for input(s): "PROCALCITON", "LATICACIDVEN" in the last 168 hours.  No results found for this or any previous visit (from the past 240 hour(s)).   Radiology Studies: No results found.  Scheduled Meds:  (feeding supplement) PROSource Plus  30 mL Oral BID BM   vitamin B-12  500 mcg Oral Daily   enoxaparin (LOVENOX) injection  40 mg Subcutaneous Daily   feeding supplement  237 mL Oral BID BM   hydrocerin   Topical BID   LORazepam  1 mg Oral TID    multivitamin with minerals  1 tablet Oral Daily   OLANZapine zydis  5 mg Oral BID   polyethylene glycol  17 g Oral Daily   senna-docusate  2 tablet Oral QHS   Tdap  0.5 mL Intramuscular Once   Continuous Infusions:   LOS: 372 days   Hughie Closs, MD [facility]  01/02/2023, 10:07 AM   *Please note that this is a verbal dictation therefore any spelling or grammatical errors are due to the "Dragon [employer] One" system interpretation.  Please page via Amion and do not message via secure chat for urgent patient care matters. Secure chat can be used for non urgent patient care matters.  How to contact the [facility] Attending or Consulting provider 7A - 7P or covering provider during after hours 7P -7A, for this patient?  Check the care team in [facility] and look for a) attending/consulting TR[facility] provider listed and b) the [facility] team listed. Page or secure chat 7A-7P. Log into [email or web address] and use [place]'s universal password to access. If you do not have the password, please contact the hospital operator. Locate the [facility] provider you are looking for under [facility] and page to a number that you can be directly reached. If you still have difficulty reaching the provider, please page the [facility] (Director on Call) for the Hospitalists listed on amion for assistance.

## 2023-01-02 NOTE — Progress Notes (Signed)
Non violent soft waist belt applied.

## 2023-01-02 NOTE — Progress Notes (Signed)
Nutrition Brief Note  Chart reviewed. Pt now transitioning to comfort care.  No further nutrition interventions planned at this time.  Please re-consult as needed.   Abby M Coggins, RD, LDN, CNSC.   

## 2023-01-02 NOTE — TOC Progression Note (Signed)
Transition of Care [facility]) - Progression Note    Patient Details  Name: Natalie Brown MRN: [id number] Date of Birth: 02/05/1966  [facility]) CM/SW Contact  Inis Sizer, LCSW Phone Number: 01/02/2023, 8:46 AM  Clinical Narrative:    CSW spoke with Ardyth Gal, [facility] owner regarding patient. Helmut Muster agreeable to review patient's clinicals and return call to CSW.     Barriers to Discharge: Financial Resources, Inadequate or no insurance, Homeless with medical needs  Expected Discharge Plan and Services In-house Referral: Clinical Social Work   Post Acute Care Choice: Nursing Home Living arrangements for the past 2 months: Homeless [facility]) Expected Discharge Date: 12/24/21                                     Social Determinants of Health (SDOH) Interventions SDOH Screenings   Food Insecurity: No Food Insecurity (03/05/2022)  Housing: High Risk (03/21/2022)  Alcohol Screen: Low Risk  (09/06/2021)  Depression (PHQ2-9): Medium Risk (05/26/2020)  Tobacco Use: High Risk (03/21/2022)    Readmission Risk Interventions     No data to display

## 2023-01-02 NOTE — Consult Note (Addendum)
[facility] Psychiatry Followup Face-to-Face Psychiatric Evaluation   Name: Natalie Brown DOB: 01-29-1966 MRN: [id number] Service Date: January 02, 2023 LOS:  LOS: 372 days  Reason for Consult: "Medication assistance-repetitive behaviors in the setting of severe catatonia previously-also somewhat agitated and requiring restraints-please assess and determine "  Referring Provider: Dr. Jacqulyn Bath   Assessment  Natalie Brown is a 57 y.o. female admitted medically for 12/24/2021 11:34 PM for possible catatonia/altered mental status. She carries the psychiatric diagnoses of bipolar affective disorder with psychotic symptoms and has a past medical history of hypertension.  This patient has had an unfortunate, rapid decline over the past several months to a year. She was initially admitted to the behavioral health hospital after decompensating with hx bipolar I disorder (was sleeping outside in the rain, confused, issues remaining on task) in 08/2021. Her psychosis and mania stabilized after about 4 weeks of admission w/ multiple medication trials (Zyprexa, haldol, and Invega). This admission revealed a significant and rapidly progressive dementia. She had a prolonged admission to behavioral health due to no safe discharge options and was transferred to the hospital in 12/2021 after 4 months after developing delirium/encephalopathy while admitted to [facility]. She has been admitted to [facility] since 12/2021 (now almost 1 year) and over that time has become almost totally nonverbal however has not been overtly manic or psychotic during that time span; dementia dx and sx has largely superceded these clinical features. Psychiatry consult team has been involved intermittently in her care over the last year (saw at admission 12/2021 when [facility] regimen continued, in 06/2022 (tapered off of haloperidol d/t rigidity/dystonia - attempted to transition to paliperidone however mother/guardian did not pick up the phone))  and was re consulted by Dr. Mahala Menghini due to repetitive behaviors (rocking back and forth). Her family is mostly concerned that these behaviors are preventing her from eating and will lead to hastened death.  She had a remarkable response to low-dose ativan (began talking, eating, now walking again) and is now significantly improved after dose increase/optimization, although mostly responding to family > hospital staff (as is expected in pt with advanced dementia).   Psychiatry reengaged on 01/02/2023. Patient initially seen after a large bowel movement and requiring a shower.  At this time, patient was ambulating and having appropriate conversation with nursing staff.  Upon reassessment, patient is laying in bed and nonresponsive.  Patient's mother is at bedside and states that she is concerned that patient is "slipping back".  She has a meal at her bedside, that mother states that she would have usually eaten already.  She has not been engaging with mother, and is beginning to exhibit curling up into a ball and rocking symptoms again.  During this assessment, patient withdrawals from examiner when attempting physical exam.  She makes minimal eye contact, and will not speak.  After some rocking, patient gets agitated and quickly climbs out of the bed grabbing the computer keyboard and pulling at the computer.  She was able to be coaxed back into the bed by her mother.  I made medication recommendations below.  Psychiatry will continue to follow.  Diagnoses:  Active Hospital problems: Principal Problem:   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) Active Problems:   Bipolar affective disorder, current episode manic with psychotic symptoms (HCC)   Tobacco abuse   Delirium due to multiple etiologies, acute, hyperactive   Sinus tachycardia   Altered mental state   B12 deficiency   COVID-19 virus infection  Drooling   Essential hypertension   Protein-calorie malnutrition, severe     Plan   ## Safety and Observation Level:  - Based on my clinical evaluation, I estimate the patient to be at moderate risk of self harm in the current setting of safety unawareness; defer management to primary team (pt in lap belt at time of eval)  ## Medications:  -- Increase olanzapine oral-dissolvable tablet 7 point 5 mg from at bedtime to twice daily  - consider consolidating to at bedtime  -- continue lorazepam liquid formula 1 mg three times daily -- Additional lorazepam 1 mg dose ordered at approximately 3 PM  For PRNs, would go with low potency antipsychotics (ie olanzapine) given prior poor response to high potency   -- prior propranolol d/c 03/2022, atropine dc 01/2022  ## Medical Decision Making Capacity:  Patient has interim legal guardian, mother Natalie Brown [phone]); clearly lacks capacity for most medical decisions.   ## Further Work-up:  -- most recent EKG on 11/23/2022  had QtC of 407 in setting of tachycardia  -- Pertinent labwork reviewed earlier this admission includes:  Labwork from last month reviewed w/ increasing creatinine.  CMP pending  ## Disposition:  --Recommend memory care unit if possible but defer to SW  ## Behavioral / Environmental:  --  delirium precautions     ##Legal Status Patient with legal guardian  Thank you for this consult request. Recommendations have been communicated to the primary team.   Psychiatry will continue to follow.  Mariel Craft, MD   NEW history  Relevant Aspects of Hospital Course:  Admitted on 12/24/2021 for acute encephalopathy; has underlying rapidly progressive and likely terminal dementia.   Patient Report:  Patient seen in bed with what appears to be recurrence of hyperactive catatonia.  Mother states that her behavior has changed following her shower today.  Nursing staff notes that patient does exhibit worsening episodes of behavior after becoming agitated with a shower.  Nursing staff concurs that patient was  awake, alert, ambulating and engaging in appropriate conversation earlier in the day.   Psychiatric History:  Information collected from chart. Bipolar affective disorder with psychotic features, MNCD Admitted to [facility] since Sep 06, 2021  Family psych history: Patient reports mental illness runs in her father's side of the family.   Social History:  Tobacco use: Half PPD; no use since admission Alcohol use: No use since admission Drug use: Denies; UDS negative on admission in May  Family History:  No pertinent FHx  Medical History: Past Medical History:  Diagnosis Date   Bipolar 1 disorder with moderate mania (HCC) 12/30/2013   Hypertension     Surgical History: History reviewed. No pertinent surgical history.  Medications:   Current Facility-Administered Medications:    (feeding supplement) PROSource Plus liquid 30 mL, 30 mL, Oral, BID BM, Danford, Earl Lites, MD, 30 mL at 01/02/23 1113   acetaminophen (TYLENOL) tablet 650 mg, 650 mg, Oral, Q6H PRN, Orland Mustard, MD, 650 mg at 12/17/22 2023   alum & mag hydroxide-simeth (MAALOX/MYLANTA) 200-200-20 MG/5ML suspension 30 mL, 30 mL, Oral, Q4H PRN, Orland Mustard, MD   bisacodyl (DULCOLAX) suppository 10 mg, 10 mg, Rectal, Daily PRN, Kc, Ramesh, MD   cyanocobalamin (VITAMIN B12) tablet 500 mcg, 500 mcg, Oral, Daily, Hazeline Junker B, MD, 500 mcg at 01/02/23 1112   enoxaparin (LOVENOX) injection 40 mg, 40 mg, Subcutaneous, Daily, Vann, Jessica U, DO, 40 mg at 01/02/23 1112   feeding supplement (ENSURE ENLIVE / ENSURE PLUS) liquid  237 mL, 237 mL, Oral, BID BM, Danford, Christopher P, MD, 237 mL at 01/02/23 1114   fluticasone (FLONASE) 50 MCG/ACT nasal spray 1 spray, 1 spray, Each Nare, Daily PRN, Orland Mustard, MD   hydrALAZINE (APRESOLINE) injection 10 mg, 10 mg, Intravenous, Q4H PRN, Amin, Ankit C, MD   hydrocerin (EUCERIN) cream, , Topical, BID, Orland Mustard, MD, Given at 01/02/23 1114   ipratropium-albuterol (DUONEB) 0.5-2.5  (3) MG/3ML nebulizer solution 3 mL, 3 mL, Nebulization, Q4H PRN, Amin, Ankit C, MD   lactulose (CHRONULAC) 10 GM/15ML solution 20 g, 20 g, Oral, BID PRN, Alanda Slim, Taye T, MD   LORazepam (ATIVAN) 2 MG/ML concentrated solution 1 mg, 1 mg, Oral, TID, Mariel Craft, MD, 1 mg at 01/02/23 1114   LORazepam (ATIVAN) 2 MG/ML concentrated solution 1 mg, 1 mg, Oral, Once, Mariel Craft, MD   LORazepam (ATIVAN) tablet 1 mg, 1 mg, Oral, Q6H PRN, Hillary Bow, DO   multivitamin with minerals tablet 1 tablet, 1 tablet, Oral, Daily, Amin, Ankit C, MD, 1 tablet at 01/02/23 1112   OLANZapine zydis (ZYPREXA) disintegrating tablet 7.5 mg, 7.5 mg, Oral, BID, Mariel Craft, MD   ondansetron [facility]) tablet 4 mg, 4 mg, Oral, Q8H PRN, Orland Mustard, MD, 4 mg at 03/17/22 0139   Oral care mouth rinse, 15 mL, Mouth Rinse, PRN, Leroy Sea, MD   polyethylene glycol (MIRALAX / GLYCOLAX) packet 17 g, 17 g, Oral, Daily, Pokhrel, Laxman, MD, 17 g at 12/30/22 1125   senna-docusate (Senokot-S) tablet 2 tablet, 2 tablet, Oral, QHS, Gherghe, Daylene Katayama, MD, 2 tablet at 12/30/22 2301   sodium zirconium cyclosilicate (LOKELMA) packet 10 g, 10 g, Oral, Daily, Pahwani, Ravi, MD   Tdap (BOOSTRIX) injection 0.5 mL, 0.5 mL, Intramuscular, Once, Vann, Jessica U, DO  Allergies: Allergies  Allergen Reactions   Penicillins Itching       Objective  Vital signs:  Temp:  [97.6 F (36.4 C)-98.2 F (36.8 C)] 97.6 F (36.4 C) (09/12 0427) Pulse Rate:  [39-84] 69 (09/12 0427) Resp:  [18-20] 20 (09/12 0427) BP: (96-164)/(70-129) 110/71 (09/12 0427) SpO2:  [82 %-100 %] 100 % (09/12 0427)  Psychiatric Specialty Exam: Psychiatric Specialty Exam  Capree Mccathern a 57 year old AA female, in her bed. She appeared slightly younger than his stated age and appears malnourished. During the interview, she was restless with rocking behaviors, and impulsively getting out of the bed and grabbing at computer keyboard trying to pull it  away from this writer.  She did not attempt to vocalize; Nonverbal again today.  She did not state a mood; impossible to judge insight/judgment/attention/orientation but assume lacking poor. She did not appear to be responding to internal stimuli.    Physical Exam: Physical Exam Vitals reviewed.  Constitutional:      General: [name] not in acute distress.    Appearance: [name] not toxic-appearing.     Comments: thin  HENT:     Head: Normocephalic and atraumatic.  Pulmonary:     Effort: Pulmonary effort is normal.  Neurological:     General: No focal deficit present.     Blood pressure 110/71, pulse 69, temperature 97.6 F (36.4 C), temperature source Oral, resp. rate 20, height 5\' 6"  (1.676 m), weight 44.6 kg, SpO2 100%. Body mass index is 15.87 kg/m.   Total Time Spent in Direct Patient Care:  I personally spent 40 minutes on the unit in direct patient care. The direct patient care time included face-to-face time with  the patient, reviewing the patient's chart, communicating with other professionals, and coordinating care. Greater than 50% of this time was spent in counseling or coordinating care with the patient regarding goals of hospitalization, psycho-education, and discharge planning needs.

## 2023-01-03 MED ORDER — OLANZAPINE 5 MG PO TBDP
10.0000 mg | ORAL_TABLET | Freq: Every day | ORAL | Status: DC
  Administered 2023-01-04 – 2023-01-16 (×12): 10 mg via ORAL
  Filled 2023-01-03 (×13): qty 2

## 2023-01-03 NOTE — Progress Notes (Signed)
PROGRESS NOTE    Natalie Brown  WUJ:[id number] DOB: 10-23-1965 DOA: 12/24/2021 PCP: Lavinia Sharps, NP   Brief Narrative:  57 y.o. 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    initially admitted to [facility] 08/2021 with agitation and aggressive behaviors.  Rx manic Bipolar, but after several months in [facility] noted significant cognitive impairment, and dementia was suspected.  Patient was observed to have significant short term memory impairment and worsening hygiene, MOCA 11/30 and CT head revealed advanced cerebral atrophy.     September 2023, patient was transferred for further evaluation and treatment due to concerns of new metabolic encephalopathy (that appear to have developed acutely over ~1 day).  Neurology and Psychiatry were consulted.  EEG was unremarkable.  Ammonia, TSH and RPR were normal.  MRI brain revealed no acute findings, but confirmed advanced atrophy.  Neurology suspected a particularly aggressive premature dementia.     Initially forgetful but redirectable and oriented, she is now abulic and essentially nonverbal (nursing note occasional staff or some family members can elicit more responses, but they are very limited).  Cannot bathe or toilet independently.  Patient holds food in the mouth, recent calorie count (7/26-7/29) was observed to eat 50% of meals or less.   Nursing report she does not swallow her pills regularly due to pocketing of pills or holding pills in the mouth for long periods.  In the last 6 months, she has had 22 kg weight loss and has become noticeably weaker and more prone to falls when attempting to walk (which she cannot do safely without assistance).   Palliative have been consulted and have met with family. Family are aware her dementia and weight loss are terminal.   Assessment & Plan:   Principal Problem:   Early onset Dementia with behavioral disturbance  (HCC) Active Problems:   Delirium due to multiple etiologies, acute, hyperactive   Bipolar affective disorder, current episode manic with psychotic symptoms (HCC)   Sinus tachycardia   Tobacco abuse   Altered mental state   B12 deficiency   COVID-19 virus infection   Drooling   Essential hypertension   Protein-calorie malnutrition, severe  Early onset dementia  Bipolar Disorder Ammonia, TSH, RPR normal.  EEG normal.  MRI brain shows advanced atrophy.   Neurology/psychiatry feel condition clearly irreversible at the time of first presentation and work up for autoimmune, paraneoplastic or prion diseases would have been futile. Mother and sister aware of terminal condition. weaned off of Haldol over the last 4 months with no discernible change in behavior. - Full supportive care - Palliative care has seen 11/18/22 - Psychiatry was following, signed off on 01/01/2023 but reengaged on 01/02/2023 due to repeated walking behavior and agitation.  They increased her olanzapine to 7.5 mg twice daily(consider consolidating to at bedtime ) and lorazepam 1 mg 3 times daily and 1 mg every 6 hours as needed for agitation per psychiatry recommendations.  Psychiatry is following.  Severe protein calorie malnutrition has lost close to 60 pounds since October of last year and continues to lose weight - Continue nutrition supplements, MVI as much as able - Appreciate Palliative Care and dietitian consultations  - Medical condition is progressive, irreversible and terminal, thus it was previously determined that tube feeds and CPR are futile and should not be offered.  Patient pulls out all IV lines, and there is a high degree of certainty that IV fluids  would be difficult and futile as well.   Vitamin B12 Deficiency:   - Continue cyanocobalamin   Essential hypertension  BP normalized off medication   CKD Stage IIIa Most recent Cr had been stable Had been unable to obtain blood draw recently    Hyperkalemia: 5.2 potassium again on the labs on 01/02/2023.  Started on [place] daily.  Repeat labs tomorrow morning.  DVT prophylaxis: enoxaparin (LOVENOX) injection 40 mg Start: 11/07/22 1000   Code Status: Do not attempt resuscitation (DNR) - Comfort care  Family Communication:  None present at bedside.  Status is: Inpatient Remains inpatient appropriate because: Unsafe disposition as of now.   Estimated body mass index is 15.87 kg/m as calculated from the following:   Height as of this encounter: 5\' 6"  (1.676 m).   Weight as of this encounter: 44.6 kg.    Nutritional Assessment: Body mass index is 15.87 kg/m.[name] Seen by dietician.  I agree with the assessment and plan as outlined below: Nutrition Status: Nutrition Problem: Severe Malnutrition Etiology: chronic illness (advanced dementia) Signs/Symptoms: energy intake < 75% for > or equal to 1 month, percent weight loss Percent weight loss: 15 % (in 3 months) Interventions: Calorie Count, Ensure Enlive (each supplement provides 350kcal and 20 grams of protein), MVI  . Skin Assessment: I have examined the patient's skin and I agree with the wound assessment as performed by the wound care RN as outlined below:    Consultants:  None  Procedures:  None  Antimicrobials:  Anti-infectives (From admission, onward)    None         Subjective: Seen and examined.  He was awake today and while she was laying in the bed sideways, she had mild rocking behavior and she was focused on playing on her phone and did not make any eye contact with me and did not respond to any questions I asked.  She appeared calm and comfortable.  Objective: Vitals:   01/01/23 1959 01/02/23 0350 01/02/23 0427 01/03/23 0159  BP: (!) 164/129 96/70 110/71 101/69  Pulse: (!) 39 84 69 (!) 57  Resp: 18 18 20 16   Temp: 98.2 F (36.8 C) 97.7 F (36.5 C) 97.6 F (36.4 C) (!) 97.5 F (36.4 C)  TempSrc: Oral Axillary Oral Axillary  SpO2: (!) 82%  100% 100% 100%  Weight:      Height:        Intake/Output Summary (Last 24 hours) at 01/03/2023 0827 Last data filed at 01/02/2023 1000 Gross per 24 hour  Intake 377 ml  Output --  Net 377 ml   Filed Weights   12/17/22 0518 12/27/22 0545 12/28/22 0513  Weight: 45.8 kg 45.6 kg 44.6 kg    Examination:  General exam: Appears calm and comfortable but has rocking movements. Respiratory system: Clear to auscultation. Respiratory effort normal. Cardiovascular system: S1 & S2 heard, RRR. No JVD, murmurs, rubs, gallops or clicks. No pedal edema. Gastrointestinal system: Abdomen is nondistended, soft and nontender. No organomegaly or masses felt. Normal bowel sounds heard. Central nervous system: Alert, unable to assess orientation.  Data Reviewed: I have personally reviewed following labs and imaging studies  CBC: No results for input(s): "WBC", "NEUTROABS", "HGB", "HCT", "MCV", "PLT" in the last 168 hours. Basic Metabolic Panel: Recent Labs  Lab 01/02/23 1041  NA 142  K 5.2*  CL 106  CO2 27  GLUCOSE 80  BUN 17  CREATININE 1.13*  CALCIUM 9.1   GFR: Estimated Creatinine Clearance: 39.6 mL/min (A) (by C-G  formula based on SCr of 1.13 mg/dL (H)). Liver Function Tests: No results for input(s): "AST", "ALT", "ALKPHOS", "BILITOT", "PROT", "ALBUMIN" in the last 168 hours. No results for input(s): "LIPASE", "AMYLASE" in the last 168 hours. No results for input(s): "AMMONIA" in the last 168 hours. Coagulation Profile: No results for input(s): "INR", "PROTIME" in the last 168 hours. Cardiac Enzymes: No results for input(s): "CKTOTAL", "CKMB", "CKMBINDEX", "TROPONINI" in the last 168 hours. BNP (last 3 results) No results for input(s): "PROBNP" in the last 8760 hours. HbA1C: No results for input(s): "HGBA1C" in the last 72 hours. CBG: No results for input(s): "GLUCAP" in the last 168 hours. Lipid Profile: No results for input(s): "CHOL", "HDL", "LDLCALC", "TRIG", "CHOLHDL",  "LDLDIRECT" in the last 72 hours. Thyroid Function Tests: No results for input(s): "TSH", "T4TOTAL", "FREET4", "T3FREE", "THYROIDAB" in the last 72 hours. Anemia Panel: No results for input(s): "VITAMINB12", "FOLATE", "FERRITIN", "TIBC", "IRON", "RETICCTPCT" in the last 72 hours. Sepsis Labs: No results for input(s): "PROCALCITON", "LATICACIDVEN" in the last 168 hours.  No results found for this or any previous visit (from the past 240 hour(s)).   Radiology Studies: No results found.  Scheduled Meds:  (feeding supplement) PROSource Plus  30 mL Oral BID BM   vitamin B-12  500 mcg Oral Daily   enoxaparin (LOVENOX) injection  40 mg Subcutaneous Daily   feeding supplement  237 mL Oral BID BM   hydrocerin   Topical BID   LORazepam  1 mg Oral TID   LORazepam  1 mg Oral Once   multivitamin with minerals  1 tablet Oral Daily   OLANZapine zydis  7.5 mg Oral BID   polyethylene glycol  17 g Oral Daily   senna-docusate  2 tablet Oral QHS   sodium zirconium cyclosilicate  10 g Oral Daily   Tdap  0.5 mL Intramuscular Once   Continuous Infusions:   LOS: 373 days   Hughie Closs, MD [facility]  01/03/2023, 8:27 AM   *Please note that this is a verbal dictation therefore any spelling or grammatical errors are due to the "Dragon Medical One" system interpretation.  Please page via Amion and do not message via secure chat for urgent patient care matters. Secure chat can be used for non urgent patient care matters.  How to contact the [facility] Attending or Consulting provider 7A - 7P or covering provider during after hours 7P -7A, for this patient?  Check the care team in [facility] and look for a) attending/consulting [facility] provider listed and b) the [facility] team listed. Page or secure chat 7A-7P. Log into [email or web address] and use [place]'s universal password to access. If you do not have the password, please contact the hospital operator. Locate the [facility] provider you are looking for under [facility] and page to a number that you can be directly reached. If you still have difficulty reaching the provider, please page the [facility] (Director on Call) for the Hospitalists listed on amion for assistance.

## 2023-01-03 NOTE — Consult Note (Signed)
[name] Psychiatry Followup Face-to-Face Psychiatric Evaluation   Name: Natalie Brown DOB: 1965-12-29 MRN: [id number] Service Date: January 03, 2023 LOS:  LOS: 373 days  Reason for Consult: "Medication assistance-repetitive behaviors in the setting of severe catatonia previously-also somewhat agitated and requiring restraints-please assess and determine "  Referring Provider: Dr. Jacqulyn Bath   Assessment  Natalie Brown is a 57 y.o. female admitted medically for 12/24/2021 11:34 PM for possible catatonia/altered mental status. She carries the psychiatric diagnoses of bipolar affective disorder with psychotic symptoms and has a past medical history of hypertension.  This patient has had an unfortunate, rapid decline over the past several months to a year. She was initially admitted to the behavioral health hospital after decompensating with hx bipolar I disorder (was sleeping outside in the rain, confused, issues remaining on task) in 08/2021. Her psychosis and mania stabilized after about 4 weeks of admission w/ multiple medication trials (Zyprexa, haldol, and Invega). This admission revealed a significant and rapidly progressive dementia. She had a prolonged admission to behavioral health due to no safe discharge options and was transferred to the hospital in 12/2021 after 4 months after developing delirium/encephalopathy while admitted to [facility]. She has been admitted to [facility] since 12/2021 (now almost 1 year) and over that time has become almost totally nonverbal however has not been overtly manic or psychotic during that time span; dementia dx and sx has largely superceded these clinical features. Psychiatry consult team has been involved intermittently in her care over the last year (saw at admission 12/2021 when [facility] regimen continued, in 06/2022 (tapered off of haloperidol d/t rigidity/dystonia - attempted to transition to paliperidone however mother/guardian did not pick up the phone))  and was re consulted by Dr. Mahala Menghini due to repetitive behaviors (rocking back and forth). Her family is mostly concerned that these behaviors are preventing her from eating and will lead to hastened death.  She had a remarkable response to low-dose ativan (began talking, eating, now walking again) and is now significantly improved after dose increase/optimization, although mostly responding to family > hospital staff (as is expected in pt with advanced dementia).   Psychiatry reengaged on 01/02/2023. Patient initially seen after a large bowel movement and requiring a shower.  At this time, patient was ambulating and having appropriate conversation with nursing staff.  Upon reassessment, patient is laying in bed and nonresponsive.  Patient's mother is at bedside and states that she is concerned that patient is "slipping back".  She has a meal at her bedside, that mother states that she would have usually eaten already.  She has not been engaging with mother, and is beginning to exhibit curling up into a ball and rocking symptoms again.  During this assessment, patient withdrawals from examiner when attempting physical exam.  She makes minimal eye contact, and will not speak.  After some rocking, patient gets agitated and quickly climbs out of the bed grabbing the computer keyboard and pulling at the computer.  She was able to be coaxed back into the bed by her mother.  I made medication recommendations below.  Psychiatry will continue to follow.  Patient seen 01/03/2023 Patient found to be nonverbal and making no eye contact. [name] observed to be purposefully playing solitaire on her phone. She withdraws when physical contact is made with her hands and demonstrates a grasp reflex on her right hand when I position my fingers on her palm. Medication recommendations made below. Psychiatry signing off.  Diagnoses:  Active  Hospital problems: Principal Problem:   Early onset Dementia with behavioral disturbance  (HCC) Active Problems:   Bipolar affective disorder, current episode manic with psychotic symptoms (HCC)   Tobacco abuse   Delirium due to multiple etiologies, acute, hyperactive   Sinus tachycardia   Altered mental state   B12 deficiency   COVID-19 virus infection   Drooling   Essential hypertension   Protein-calorie malnutrition, severe     Plan  ## Safety and Observation Level:  - Based on my clinical evaluation, I estimate the patient to be at moderate risk of self harm in the current setting of safety unawareness; defer management to primary team (pt in lap belt at time of eval)  ## Medications:  -- increase olanzapine oral-dissolvable tablet from 7.5 mg to 10 mg at bedtime -- continue lorazepam liquid formula 1 mg three times daily  For PRNs, would go with low potency antipsychotics (ie olanzapine) given prior poor response to high potency   -- prior propranolol d/c 03/2022, atropine dc 01/2022  ## Medical Decision Making Capacity:  Patient has interim legal guardian, mother Natalie Brown [phone]); clearly lacks capacity for most medical decisions.   ## Further Work-up:  -- most recent EKG on 11/23/2022  had QtC of 407 in setting of tachycardia  -- Pertinent labwork reviewed earlier this admission includes:  Labwork from last month reviewed w/ increasing creatinine.  CMP pending  ## Disposition:  --Recommend memory care unit if possible but defer to SW  ## Behavioral / Environmental:  --  delirium precautions     ##Legal Status Patient with legal guardian  Thank you for this consult request. Recommendations have been communicated to the primary team.   Psychiatry will continue to follow.  Augusto Gamble, MD   NEW history  Relevant Aspects of Hospital Course:  Admitted on 12/24/2021 for acute encephalopathy; has underlying rapidly progressive and likely terminal dementia.   Patient Report:  Unable to obtain report as patient is nonverbal.   Psychiatric History:   Information collected from chart. Bipolar affective disorder with psychotic features, MNCD Admitted to [facility] since Sep 06, 2021  Family psych history: Patient reports mental illness runs in her father's side of the family.   Social History:  Tobacco use: Half PPD; no use since admission Alcohol use: No use since admission Drug use: Denies; UDS negative on admission in May  Family History:  No pertinent FHx  Medical History: Past Medical History:  Diagnosis Date   Bipolar 1 disorder with moderate mania (HCC) 12/30/2013   Hypertension     Surgical History: History reviewed. No pertinent surgical history.  Medications:   Current Facility-Administered Medications:    (feeding supplement) PROSource Plus liquid 30 mL, 30 mL, Oral, BID BM, Danford, Earl Lites, MD, 30 mL at 01/03/23 1022   acetaminophen (TYLENOL) tablet 650 mg, 650 mg, Oral, Q6H PRN, Orland Mustard, MD, 650 mg at 12/17/22 2023   alum & mag hydroxide-simeth (MAALOX/MYLANTA) 200-200-20 MG/5ML suspension 30 mL, 30 mL, Oral, Q4H PRN, Orland Mustard, MD   bisacodyl (DULCOLAX) suppository 10 mg, 10 mg, Rectal, Daily PRN, Kc, Ramesh, MD   cyanocobalamin (VITAMIN B12) tablet 500 mcg, 500 mcg, Oral, Daily, Hazeline Junker B, MD, 500 mcg at 01/03/23 1021   enoxaparin (LOVENOX) injection 40 mg, 40 mg, Subcutaneous, Daily, Vann, Jessica U, DO, 40 mg at 01/03/23 1022   feeding supplement (ENSURE ENLIVE / ENSURE PLUS) liquid 237 mL, 237 mL, Oral, BID BM, Danford, Earl Lites, MD, 237 mL at 01/03/23 1022  fluticasone (FLONASE) 50 MCG/ACT nasal spray 1 spray, 1 spray, Each Nare, Daily PRN, Orland Mustard, MD   hydrALAZINE (APRESOLINE) injection 10 mg, 10 mg, Intravenous, Q4H PRN, Amin, Ankit C, MD   hydrocerin (EUCERIN) cream, , Topical, BID, Orland Mustard, MD, Given at 01/03/23 1022   ipratropium-albuterol (DUONEB) 0.5-2.5 (3) MG/3ML nebulizer solution 3 mL, 3 mL, Nebulization, Q4H PRN, Amin, Ankit C, MD   lactulose (CHRONULAC) 10  GM/15ML solution 20 g, 20 g, Oral, BID PRN, Alanda Slim, Taye T, MD   LORazepam (ATIVAN) 2 MG/ML concentrated solution 1 mg, 1 mg, Oral, TID, Mariel Craft, MD, 1 mg at 01/03/23 1021   LORazepam (ATIVAN) 2 MG/ML concentrated solution 1 mg, 1 mg, Oral, Once, Mariel Craft, MD   LORazepam (ATIVAN) tablet 1 mg, 1 mg, Oral, Q6H PRN, Hillary Bow, DO   multivitamin with minerals tablet 1 tablet, 1 tablet, Oral, Daily, Amin, Ankit C, MD, 1 tablet at 01/03/23 1021   [START ON 01/04/2023] OLANZapine zydis (ZYPREXA) disintegrating tablet 10 mg, 10 mg, Oral, QHS, Mariel Craft, MD   ondansetron [facility]) tablet 4 mg, 4 mg, Oral, Q8H PRN, Orland Mustard, MD, 4 mg at 03/17/22 0139   Oral care mouth rinse, 15 mL, Mouth Rinse, PRN, Leroy Sea, MD   polyethylene glycol (MIRALAX / GLYCOLAX) packet 17 g, 17 g, Oral, Daily, Pokhrel, Laxman, MD, 17 g at 12/30/22 1125   senna-docusate (Senokot-S) tablet 2 tablet, 2 tablet, Oral, QHS, Gherghe, Daylene Katayama, MD, 2 tablet at 12/30/22 2301   sodium zirconium cyclosilicate (LOKELMA) packet 10 g, 10 g, Oral, Daily, Pahwani, Ravi, MD, 10 g at 01/03/23 1023   Tdap (BOOSTRIX) injection 0.5 mL, 0.5 mL, Intramuscular, Once, Vann, Jessica U, DO  Allergies: Allergies  Allergen Reactions   Penicillins Itching       Objective  Vital signs:  Temp:  [97.5 F (36.4 C)] 97.5 F (36.4 C) (09/13 0159) Pulse Rate:  [57] 57 (09/13 0159) Resp:  [16] 16 (09/13 0159) BP: (101)/(69) 101/69 (09/13 0159) SpO2:  [100 %] 100 % (09/13 0159)  Psychiatric Specialty Exam:  Justine Nighswander a 57 year old AA female, in her bed. She appeared slightly younger than his stated age and appears malnourished. During the interview, she was observed to be purposefully playing solitaire on her cellphone.  She did not attempt to vocalize; still nonverbal today. Impossible to judge insight/judgment/attention/orientation but assume lacking/poor. She did not appear to be responding to internal  stimuli.    Physical Exam: Physical Exam Vitals reviewed.  Constitutional:      General: [name] not in acute distress.    Appearance: [name] not toxic-appearing.     Comments: thin  HENT:     Head: Normocephalic and atraumatic.  Pulmonary:     Effort: Pulmonary effort is normal.  Neurological:     General: No focal deficit present.    Blood pressure 101/69, pulse (!) 57, temperature (!) 97.5 F (36.4 C), temperature source Axillary, resp. rate 16, height 5\' 6"  (1.676 m), weight 44.6 kg, SpO2 100%. Body mass index is 15.87 kg/m.   Total Time Spent in Direct Patient Care:  I personally spent 40 minutes on the unit in direct patient care. The direct patient care time included face-to-face time with the patient, reviewing the patient's chart, communicating with other professionals, and coordinating care. Greater than 50% of this time was spent in counseling or coordinating care with the patient regarding goals of hospitalization, psycho-education, and discharge planning needs.

## 2023-01-03 NOTE — Plan of Care (Signed)

## 2023-01-04 NOTE — Plan of Care (Signed)

## 2023-01-04 NOTE — Progress Notes (Signed)
PROGRESS NOTE    Natalie Brown  ION:[id number] DOB: 05-Jan-1966 DOA: 12/24/2021 PCP: Lavinia Sharps, NP   Brief Narrative:  57 y.o. 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    initially admitted to [facility] 08/2021 with agitation and aggressive behaviors.  Rx manic Bipolar, but after several months in [facility] noted significant cognitive impairment, and dementia was suspected.  Patient was observed to have significant short term memory impairment and worsening hygiene, MOCA 11/30 and CT head revealed advanced cerebral atrophy.     September 2023, patient was transferred for further evaluation and treatment due to concerns of new metabolic encephalopathy (that appear to have developed acutely over ~1 day).  Neurology and Psychiatry were consulted.  EEG was unremarkable.  Ammonia, TSH and RPR were normal.  MRI brain revealed no acute findings, but confirmed advanced atrophy.  Neurology suspected a particularly aggressive premature dementia.     Initially forgetful but redirectable and oriented, she is now abulic and essentially nonverbal (nursing note occasional staff or some family members can elicit more responses, but they are very limited).  Cannot bathe or toilet independently.  Patient holds food in the mouth, recent calorie count (7/26-7/29) was observed to eat 50% of meals or less.   Nursing report she does not swallow her pills regularly due to pocketing of pills or holding pills in the mouth for long periods.  In the last 6 months, she has had 22 kg weight loss and has become noticeably weaker and more prone to falls when attempting to walk (which she cannot do safely without assistance).   Palliative have been consulted and have met with family. Family are aware her dementia and weight loss are terminal.   Assessment & Plan:   Principal Problem:   Early onset Dementia with behavioral disturbance  (HCC) Active Problems:   Delirium due to multiple etiologies, acute, hyperactive   Bipolar affective disorder, current episode manic with psychotic symptoms (HCC)   Sinus tachycardia   Tobacco abuse   Altered mental state   B12 deficiency   COVID-19 virus infection   Drooling   Essential hypertension   Protein-calorie malnutrition, severe  Early onset dementia  Bipolar Disorder Ammonia, TSH, RPR normal.  EEG normal.  MRI brain shows advanced atrophy.   Neurology/psychiatry feel condition clearly irreversible at the time of first presentation and work up for autoimmune, paraneoplastic or prion diseases would have been futile. Mother and sister aware of terminal condition. weaned off of Haldol over the last 4 months with no discernible change in behavior. - Full supportive care - Palliative care has seen 11/18/22 - Psychiatry was following, signed off on 01/01/2023 but reengaged on 01/02/2023 due to repeated walking behavior and agitation.  They increased her olanzapine to 7.5 mg twice daily and then eventually consolidated it to 10 mg at bedtime on 01/04/2023.  [name] on lorazepam 1 mg 3 times daily and 1 mg every 6 hours as needed for agitation per psychiatry recommendations.  Psychiatry is following.  Severe protein calorie malnutrition has lost close to 60 pounds since October of last year and continues to lose weight - Continue nutrition supplements, MVI as much as able - Appreciate Palliative Care and dietitian consultations  - Medical condition is progressive, irreversible and terminal, thus it was previously determined that tube feeds and CPR are futile and should not be offered.  Patient pulls out all IV lines, and  there is a high degree of certainty that IV fluids would be difficult and futile as well.   Vitamin B12 Deficiency:   - Continue cyanocobalamin   Essential hypertension  BP normalized off medication   CKD Stage IIIa Most recent Cr had been stable Had been unable to  obtain blood draw recently   Hyperkalemia: 5.2 potassium again on the labs on 01/02/2023.  Started on [place] daily.  Repeat labs are pending today.  DVT prophylaxis: enoxaparin (LOVENOX) injection 40 mg Start: 11/07/22 1000   Code Status: Do not attempt resuscitation (DNR) - Comfort care  Family Communication:  None present at bedside.  Status is: Inpatient Remains inpatient appropriate because: Unsafe disposition as of now.   Estimated body mass index is 15.87 kg/m as calculated from the following:   Height as of this encounter: 5\' 6"  (1.676 m).   Weight as of this encounter: 44.6 kg.    Nutritional Assessment: Body mass index is 15.87 kg/m.[name] Seen by dietician.  I agree with the assessment and plan as outlined below: Nutrition Status: Nutrition Problem: Severe Malnutrition Etiology: chronic illness (advanced dementia) Signs/Symptoms: energy intake < 75% for > or equal to 1 month, percent weight loss Percent weight loss: 15 % (in 3 months) Interventions: Calorie Count, Ensure Enlive (each supplement provides 350kcal and 20 grams of protein), MVI  . Skin Assessment: I have examined the patient's skin and I agree with the wound assessment as performed by the wound care RN as outlined below:    Consultants:  None  Procedures:  None  Antimicrobials:  Anti-infectives (From admission, onward)    None         Subjective: Patient seen and examined.  She was initially sleeping but she woke up with the voice.  When she woke up, she started having those rocking movements.  She appeared to be trying to communicate but could not talk much.  Was trying to get out of the bed.  Objective: Vitals:   01/02/23 0427 01/03/23 0159 01/03/23 1641 01/04/23 0633  BP: 110/71 101/69 (!) 135/103 (!) 109/57  Pulse: 69 (!) 57 84 (!) 58  Resp: 20 16 18    Temp: 97.6 F (36.4 C) (!) 97.5 F (36.4 C)  (!) 97.4 F (36.3 C)  TempSrc: Oral Axillary    SpO2: 100% 100%  100%  Weight:       Height:       No intake or output data in the 24 hours ending 01/04/23 1251  Filed Weights   12/17/22 0518 12/27/22 0545 12/28/22 0513  Weight: 45.8 kg 45.6 kg 44.6 kg    Examination:  General exam: Appears calm and comfortable but has rocking movements. Respiratory system: Clear to auscultation. Respiratory effort normal. Cardiovascular system: S1 & S2 heard, RRR. No JVD, murmurs, rubs, gallops or clicks. No pedal edema. Gastrointestinal system: Abdomen is nondistended, soft and nontender. No organomegaly or masses felt. Normal bowel sounds heard. Central nervous system: Alert, unable to assess orientation.  Data Reviewed: I have personally reviewed following labs and imaging studies  CBC: No results for input(s): "WBC", "NEUTROABS", "HGB", "HCT", "MCV", "PLT" in the last 168 hours. Basic Metabolic Panel: Recent Labs  Lab 01/02/23 1041  NA 142  K 5.2*  CL 106  CO2 27  GLUCOSE 80  BUN 17  CREATININE 1.13*  CALCIUM 9.1   GFR: Estimated Creatinine Clearance: 39.6 mL/min (A) (by C-G formula based on SCr of 1.13 mg/dL (H)). Liver Function Tests: No results for input(s): "AST", "  ALT", "ALKPHOS", "BILITOT", "PROT", "ALBUMIN" in the last 168 hours. No results for input(s): "LIPASE", "AMYLASE" in the last 168 hours. No results for input(s): "AMMONIA" in the last 168 hours. Coagulation Profile: No results for input(s): "INR", "PROTIME" in the last 168 hours. Cardiac Enzymes: No results for input(s): "CKTOTAL", "CKMB", "CKMBINDEX", "TROPONINI" in the last 168 hours. BNP (last 3 results) No results for input(s): "PROBNP" in the last 8760 hours. HbA1C: No results for input(s): "HGBA1C" in the last 72 hours. CBG: No results for input(s): "GLUCAP" in the last 168 hours. Lipid Profile: No results for input(s): "CHOL", "HDL", "LDLCALC", "TRIG", "CHOLHDL", "LDLDIRECT" in the last 72 hours. Thyroid Function Tests: No results for input(s): "TSH", "T4TOTAL", "FREET4", "T3FREE",  "THYROIDAB" in the last 72 hours. Anemia Panel: No results for input(s): "VITAMINB12", "FOLATE", "FERRITIN", "TIBC", "IRON", "RETICCTPCT" in the last 72 hours. Sepsis Labs: No results for input(s): "PROCALCITON", "LATICACIDVEN" in the last 168 hours.  No results found for this or any previous visit (from the past 240 hour(s)).   Radiology Studies: No results found.  Scheduled Meds:  (feeding supplement) PROSource Plus  30 mL Oral BID BM   vitamin B-12  500 mcg Oral Daily   enoxaparin (LOVENOX) injection  40 mg Subcutaneous Daily   feeding supplement  237 mL Oral BID BM   hydrocerin   Topical BID   LORazepam  1 mg Oral TID   LORazepam  1 mg Oral Once   multivitamin with minerals  1 tablet Oral Daily   OLANZapine zydis  10 mg Oral QHS   polyethylene glycol  17 g Oral Daily   senna-docusate  2 tablet Oral QHS   sodium zirconium cyclosilicate  10 g Oral Daily   Tdap  0.5 mL Intramuscular Once   Continuous Infusions:   LOS: 374 days   Hughie Closs, MD [facility]  01/04/2023, 12:51 PM   *Please note that this is a verbal dictation therefore any spelling or grammatical errors are due to the "Dragon Medical One" system interpretation.  Please page via Amion and do not message via secure chat for urgent patient care matters. Secure chat can be used for non urgent patient care matters.  How to contact the [facility] Attending or Consulting provider 7A - 7P or covering provider during after hours 7P -7A, for this patient?  Check the care team in [facility] and look for a) attending/consulting TRH provider listed and b) the [facility] team listed. Page or secure chat 7A-7P. Log into [email or web address] and use [place]'s universal password to access. If you do not have the password, please contact the hospital operator. Locate the [facility] provider you are looking for under [facility] and page to a number that you can be directly reached. If you still have difficulty reaching the provider, please  page the [facility] (Director on Call) for the Hospitalists listed on amion for assistance.

## 2023-01-05 NOTE — Progress Notes (Signed)
PROGRESS NOTE    Natalie Brown  DDU:[id number] DOB: 06-17-1965 DOA: 12/24/2021 PCP: Lavinia Sharps, NP   Brief Narrative:  57 y.o. 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    initially admitted to [facility] 08/2021 with agitation and aggressive behaviors.  Rx manic Bipolar, but after several months in [facility] noted significant cognitive impairment, and dementia was suspected.  Patient was observed to have significant short term memory impairment and worsening hygiene, MOCA 11/30 and CT head revealed advanced cerebral atrophy.     September 2023, patient was transferred for further evaluation and treatment due to concerns of new metabolic encephalopathy (that appear to have developed acutely over ~1 day).  Neurology and Psychiatry were consulted.  EEG was unremarkable.  Ammonia, TSH and RPR were normal.  MRI brain revealed no acute findings, but confirmed advanced atrophy.  Neurology suspected a particularly aggressive premature dementia.     Initially forgetful but redirectable and oriented, she is now abulic and essentially nonverbal (nursing note occasional staff or some family members can elicit more responses, but they are very limited).  Cannot bathe or toilet independently.  Patient holds food in the mouth, recent calorie count (7/26-7/29) was observed to eat 50% of meals or less.   Nursing report she does not swallow her pills regularly due to pocketing of pills or holding pills in the mouth for long periods.  In the last 6 months, she has had 22 kg weight loss and has become noticeably weaker and more prone to falls when attempting to walk (which she cannot do safely without assistance).   Palliative have been consulted and have met with family. Family are aware her dementia and weight loss are terminal.   Assessment & Plan:   Principal Problem:   Early onset Dementia with behavioral disturbance  (HCC) Active Problems:   Delirium due to multiple etiologies, acute, hyperactive   Bipolar affective disorder, current episode manic with psychotic symptoms (HCC)   Sinus tachycardia   Tobacco abuse   Altered mental state   B12 deficiency   COVID-19 virus infection   Drooling   Essential hypertension   Protein-calorie malnutrition, severe  Early onset dementia  Bipolar Disorder Ammonia, TSH, RPR normal.  EEG normal.  MRI brain shows advanced atrophy.   Neurology/psychiatry feel condition clearly irreversible at the time of first presentation and work up for autoimmune, paraneoplastic or prion diseases would have been futile. Mother and sister aware of terminal condition. weaned off of Haldol over the last 4 months with no discernible change in behavior. - Full supportive care - Palliative care has seen 11/18/22 - Psychiatry was following, signed off on 01/01/2023 but reengaged on 01/02/2023 due to repeated walking behavior and agitation.  They increased her olanzapine to 7.5 mg twice daily and then eventually consolidated it to 10 mg at bedtime on 01/04/2023.  [name] on lorazepam 1 mg 3 times daily and 1 mg every 6 hours as needed for agitation per psychiatry recommendations.  Psychiatry is following.  Severe protein calorie malnutrition has lost close to 60 pounds since October of last year and continues to lose weight - Continue nutrition supplements, MVI as much as able - Appreciate Palliative Care and dietitian consultations  - Medical condition is progressive, irreversible and terminal, thus it was previously determined that tube feeds and CPR are futile and should not be offered.  Patient pulls out all IV lines, and  there is a high degree of certainty that IV fluids would be difficult and futile as well.   Vitamin B12 Deficiency:   - Continue cyanocobalamin   Essential hypertension  BP normalized off medication   CKD Stage IIIa Most recent Cr had been stable Had been unable to  obtain blood draw recently   Hyperkalemia: 5.2 potassium again on the labs on 01/02/2023.  Started on [place] daily.  Repeat labs are pending today.  DVT prophylaxis: enoxaparin (LOVENOX) injection 40 mg Start: 11/07/22 1000   Code Status: Do not attempt resuscitation (DNR) - Comfort care  Family Communication:  None present at bedside.  Status is: Inpatient Remains inpatient appropriate because: Unsafe disposition as of now.   Estimated body mass index is 15.87 kg/m as calculated from the following:   Height as of this encounter: 5\' 6"  (1.676 m).   Weight as of this encounter: 44.6 kg.    Nutritional Assessment: Body mass index is 15.87 kg/m.[name] Seen by dietician.  I agree with the assessment and plan as outlined below: Nutrition Status: Nutrition Problem: Severe Malnutrition Etiology: chronic illness (advanced dementia) Signs/Symptoms: energy intake < 75% for > or equal to 1 month, percent weight loss Percent weight loss: 15 % (in 3 months) Interventions: Calorie Count, Ensure Enlive (each supplement provides 350kcal and 20 grams of protein), MVI  . Skin Assessment: I have examined the patient's skin and I agree with the wound assessment as performed by the wound care RN as outlined below:    Consultants:  None  Procedures:  None  Antimicrobials:  Anti-infectives (From admission, onward)    None         Subjective: Seen and examined.  Patient continues to have rocking movement and is focused on her phone.  [name] not interacting or tracking.  Objective: Vitals:   01/04/23 0633 01/04/23 1939 01/04/23 2208 01/05/23 0428  BP: (!) 109/57 (!) 164/127 90/61 96/67  Pulse: (!) 58 97 65 71  Resp:  18  19  Temp: (!) 97.4 F (36.3 C) (!) 97.5 F (36.4 C)  (!) 97.5 F (36.4 C)  TempSrc:      SpO2: 100% (!) 78% 100% 100%  Weight:      Height:       No intake or output data in the 24 hours ending 01/05/23 1002  Filed Weights   12/17/22 0518 12/27/22 0545  12/28/22 0513  Weight: 45.8 kg 45.6 kg 44.6 kg    Examination:  General exam: Appears calm and comfortable but has rocking movements. Respiratory system: Clear to auscultation. Respiratory effort normal. Cardiovascular system: S1 & S2 heard, RRR. No JVD, murmurs, rubs, gallops or clicks. No pedal edema. Gastrointestinal system: Abdomen is nondistended, soft and nontender. No organomegaly or masses felt. Normal bowel sounds heard. Central nervous system: Alert, unable to assess orientation.  Data Reviewed: I have personally reviewed following labs and imaging studies  CBC: No results for input(s): "WBC", "NEUTROABS", "HGB", "HCT", "MCV", "PLT" in the last 168 hours. Basic Metabolic Panel: Recent Labs  Lab 01/02/23 1041  NA 142  K 5.2*  CL 106  CO2 27  GLUCOSE 80  BUN 17  CREATININE 1.13*  CALCIUM 9.1   GFR: Estimated Creatinine Clearance: 39.6 mL/min (A) (by C-G formula based on SCr of 1.13 mg/dL (H)). Liver Function Tests: No results for input(s): "AST", "ALT", "ALKPHOS", "BILITOT", "PROT", "ALBUMIN" in the last 168 hours. No results for input(s): "LIPASE", "AMYLASE" in the last 168 hours. No results for input(s): "  AMMONIA" in the last 168 hours. Coagulation Profile: No results for input(s): "INR", "PROTIME" in the last 168 hours. Cardiac Enzymes: No results for input(s): "CKTOTAL", "CKMB", "CKMBINDEX", "TROPONINI" in the last 168 hours. BNP (last 3 results) No results for input(s): "PROBNP" in the last 8760 hours. HbA1C: No results for input(s): "HGBA1C" in the last 72 hours. CBG: No results for input(s): "GLUCAP" in the last 168 hours. Lipid Profile: No results for input(s): "CHOL", "HDL", "LDLCALC", "TRIG", "CHOLHDL", "LDLDIRECT" in the last 72 hours. Thyroid Function Tests: No results for input(s): "TSH", "T4TOTAL", "FREET4", "T3FREE", "THYROIDAB" in the last 72 hours. Anemia Panel: No results for input(s): "VITAMINB12", "FOLATE", "FERRITIN", "TIBC", "IRON",  "RETICCTPCT" in the last 72 hours. Sepsis Labs: No results for input(s): "PROCALCITON", "LATICACIDVEN" in the last 168 hours.  No results found for this or any previous visit (from the past 240 hour(s)).   Radiology Studies: No results found.  Scheduled Meds:  (feeding supplement) PROSource Plus  30 mL Oral BID BM   vitamin B-12  500 mcg Oral Daily   enoxaparin (LOVENOX) injection  40 mg Subcutaneous Daily   feeding supplement  237 mL Oral BID BM   hydrocerin   Topical BID   LORazepam  1 mg Oral TID   LORazepam  1 mg Oral Once   multivitamin with minerals  1 tablet Oral Daily   OLANZapine zydis  10 mg Oral QHS   polyethylene glycol  17 g Oral Daily   senna-docusate  2 tablet Oral QHS   sodium zirconium cyclosilicate  10 g Oral Daily   Tdap  0.5 mL Intramuscular Once   Continuous Infusions:   LOS: 375 days   Hughie Closs, MD [facility]  01/05/2023, 10:02 AM   *Please note that this is a verbal dictation therefore any spelling or grammatical errors are due to the "Dragon Medical One" system interpretation.  Please page via Amion and do not message via secure chat for urgent patient care matters. Secure chat can be used for non urgent patient care matters.  How to contact the [facility] Attending or Consulting provider 7A - 7P or covering provider during after hours 7P -7A, for this patient?  Check the care team in [facility] and look for a) attending/consulting TRH provider listed and b) the [facility] team listed. Page or secure chat 7A-7P. Log into [email or web address] and use [place]'s universal password to access. If you do not have the password, please contact the hospital operator. Locate the [facility] provider you are looking for under [facility] and page to a number that you can be directly reached. If you still have difficulty reaching the provider, please page the [facility] (Director on Call) for the Hospitalists listed on amion for assistance.

## 2023-01-05 NOTE — TOC Progression Note (Signed)
Transition of Care [facility]) - Progression Note    Patient Details  Name: Natalie Brown MRN: [id number] Date of Birth: Sep 18, 1965  Transition of Care [facility] CM/SW Contact  Inis Sizer, LCSW Phone Number: 01/05/2023, 9:10 AM  Clinical Narrative:    There are no discharge planning updates available at this time.  Patient has been not been offered a SNF bed by any facility. Patient's mother is supposed to be checking with the leasing office at her apartment complex to determine if there are any larger units available so that the patient can return home with her mother to receive ongoing care.  CSW will continue to follow for discharge planning purposes.     Barriers to Discharge: Financial Resources, Inadequate or no insurance, Homeless with medical needs  Expected Discharge Plan and Services In-house Referral: Clinical Social Work   Post Acute Care Choice: Nursing Home Living arrangements for the past 2 months: Homeless [facility]) Expected Discharge Date: 12/24/21                                     Social Determinants of Health (SDOH) Interventions SDOH Screenings   Food Insecurity: No Food Insecurity (03/05/2022)  Housing: High Risk (03/21/2022)  Alcohol Screen: Low Risk  (09/06/2021)  Depression (PHQ2-9): Medium Risk (05/26/2020)  Tobacco Use: High Risk (03/21/2022)    Readmission Risk Interventions     No data to display

## 2023-01-05 NOTE — Plan of Care (Signed)
  Problem: Activity: Goal: Risk for activity intolerance will decrease Outcome: Progressing   Problem: Safety: Goal: Ability to remain free from injury will improve Outcome: Progressing   Problem: Skin Integrity: Goal: Risk for impaired skin integrity will decrease Outcome: Progressing   

## 2023-01-06 LAB — BASIC METABOLIC PANEL WITH GFR
Anion gap: 9 (ref 5–15)
Anion gap: 7 (ref 5–15)
BUN: 20 mg/dL (ref 6–20)
BUN: 17 mg/dL (ref 6–20)
CO2: 22 mmol/L (ref 22–32)
CO2: 25 mmol/L (ref 22–32)
Calcium: 8.3 mg/dL — ABNORMAL LOW (ref 8.9–10.3)
Calcium: 8.8 mg/dL — ABNORMAL LOW (ref 8.9–10.3)
Chloride: 108 mmol/L (ref 98–111)
Chloride: 106 mmol/L (ref 98–111)
Creatinine, Ser: 1.07 mg/dL — ABNORMAL HIGH (ref 0.44–1.00)
Creatinine, Ser: 1.06 mg/dL — ABNORMAL HIGH (ref 0.44–1.00)
GFR, Estimated: >60 mL/min
GFR, Estimated: >60 mL/min
Glucose, Bld: 69 mg/dL — ABNORMAL LOW (ref 70–99)
Glucose, Bld: 85 mg/dL (ref 70–99)
Potassium: 4.7 mmol/L (ref 3.5–5.1)
Potassium: 4.4 mmol/L (ref 3.5–5.1)
Sodium: 139 mmol/L (ref 135–145)
Sodium: 138 mmol/L (ref 135–145)

## 2023-01-06 NOTE — Progress Notes (Signed)
Patient was walking in the hallway with her mom. Trying to pull her mom and had a witnessed fall. No any injured occurred. VSS. MD Marta Lamas made aware. No new order received at the time. Will continue to monitor.

## 2023-01-06 NOTE — Plan of Care (Signed)
Problem: Clinical Measurements: Goal: Respiratory complications will improve Outcome: Progressing   Problem: Safety: Goal: Ability to remain free from injury will improve Outcome: Progressing   Problem: Skin Integrity: Goal: Risk for impaired skin integrity will decrease Outcome: Progressing

## 2023-01-06 NOTE — Progress Notes (Signed)
PROGRESS NOTE    Natalie Brown  WUX:[id number] DOB: [birth year] DOA: 12/24/2021 PCP: Lavinia Sharps, NP   Brief Narrative:  57 y.o. 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    initially admitted to [facility] 08/2021 with agitation and aggressive behaviors.  Rx manic Bipolar, but after several months in [facility] noted significant cognitive impairment, and dementia was suspected.  Patient was observed to have significant short term memory impairment and worsening hygiene, MOCA 11/30 and CT head revealed advanced cerebral atrophy.     September 2023, patient was transferred for further evaluation and treatment due to concerns of new metabolic encephalopathy (that appear to have developed acutely over ~1 day).  Neurology and Psychiatry were consulted.  EEG was unremarkable.  Ammonia, TSH and RPR were normal.  MRI brain revealed no acute findings, but confirmed advanced atrophy.  Neurology suspected a particularly aggressive premature dementia.     Initially forgetful but redirectable and oriented, she is now abulic and essentially nonverbal (nursing note occasional staff or some family members can elicit more responses, but they are very limited).  Cannot bathe or toilet independently.  Patient holds food in the mouth, recent calorie count (7/26-7/29) was observed to eat 50% of meals or less.   Nursing report she does not swallow her pills regularly due to pocketing of pills or holding pills in the mouth for long periods.  In the last 6 months, she has had 22 kg weight loss and has become noticeably weaker and more prone to falls when attempting to walk (which she cannot do safely without assistance).   Palliative have been consulted and have met with family. Family are aware her dementia and weight loss are terminal.   Assessment & Plan:   Principal Problem:   Early onset Dementia with behavioral disturbance  (HCC) Active Problems:   Delirium due to multiple etiologies, acute, hyperactive   Bipolar affective disorder, current episode manic with psychotic symptoms (HCC)   Sinus tachycardia   Tobacco abuse   Altered mental state   B12 deficiency   COVID-19 virus infection   Drooling   Essential hypertension   Protein-calorie malnutrition, severe  Early onset dementia  Bipolar Disorder Ammonia, TSH, RPR normal.  EEG normal.  MRI brain shows advanced atrophy.   Neurology/psychiatry feel condition clearly irreversible at the time of first presentation and work up for autoimmune, paraneoplastic or prion diseases would have been futile. Mother and sister aware of terminal condition. weaned off of Haldol over the last 4 months with no discernible change in behavior. - Full supportive care - Palliative care has seen 11/18/22 - Psychiatry was following, signed off on 01/01/2023 but reengaged on 01/02/2023 due to repeated walking behavior and agitation.  They increased her olanzapine to 7.5 mg twice daily and then eventually consolidated it to 10 mg at bedtime on 01/04/2023.  [name] on lorazepam 1 mg 3 times daily and 1 mg every 6 hours as needed for agitation per psychiatry recommendations.  Psychiatry is following.  Severe protein calorie malnutrition has lost close to 60 pounds since October of last year and continues to lose weight - Continue nutrition supplements, MVI as much as able - Appreciate Palliative Care and dietitian consultations  - Medical condition is progressive, irreversible and terminal, thus it was previously determined that tube feeds and CPR are futile and should not be offered.  Patient pulls out all IV lines, and  there is a high degree of certainty that IV fluids would be difficult and futile as well.   Vitamin B12 Deficiency:   - Continue cyanocobalamin   Essential hypertension  BP normalized off medication   CKD Stage IIIa Most recent Cr had been stable Had been unable to  obtain blood draw recently   Hyperkalemia: 5.2 potassium again on the labs on 01/02/2023.  Started on [place] daily.  Labs were ordered for 01/04/2023 but it was never collected and somehow the order is now discontinued.  I will reorder BMP.  Discontinue Lokelma now.  DVT prophylaxis: enoxaparin (LOVENOX) injection 40 mg Start: 11/07/22 1000   Code Status: Do not attempt resuscitation (DNR) - Comfort care  Family Communication:  None present at bedside.  Status is: Inpatient Remains inpatient appropriate because: Unsafe disposition as of now.   Estimated body mass index is 15.87 kg/m as calculated from the following:   Height as of this encounter: 5\' 6"  (1.676 m).   Weight as of this encounter: 44.6 kg.    Nutritional Assessment: Body mass index is 15.87 kg/m.[name] Seen by dietician.  I agree with the assessment and plan as outlined below: Nutrition Status: Nutrition Problem: Severe Malnutrition Etiology: chronic illness (advanced dementia) Signs/Symptoms: energy intake < 75% for > or equal to 1 month, percent weight loss Percent weight loss: 15 % (in 3 months) Interventions: Calorie Count, Ensure Enlive (each supplement provides 350kcal and 20 grams of protein), MVI  . Skin Assessment: I have examined the patient's skin and I agree with the wound assessment as performed by the wound care RN as outlined below:    Consultants:  None  Procedures:  None  Antimicrobials:  Anti-infectives (From admission, onward)    None         Subjective: Patient seen and examined.  She just finished her breakfast.  Continues to have rocking movement.  Objective: Vitals:   01/05/23 2019 01/06/23 0448 01/06/23 0513 01/06/23 0800  BP: 101/67 (!) 82/60 106/71 94/70  Pulse: 79 (!) 55 (!) 58 (!) 58  Resp: 20 19 20 18   Temp: 98.2 F (36.8 C) 97.8 F (36.6 C)  98.4 F (36.9 C)  TempSrc:    Axillary  SpO2: 93% 100% 100% 100%  Weight:      Height:        Intake/Output Summary (Last  24 hours) at 01/06/2023 1259 Last data filed at 01/06/2023 1117 Gross per 24 hour  Intake 240 ml  Output --  Net 240 ml    Filed Weights   12/17/22 0518 12/27/22 0545 12/28/22 0513  Weight: 45.8 kg 45.6 kg 44.6 kg    Examination:  General exam: Appears calm and comfortable but has rocking movements. Respiratory system: Clear to auscultation. Respiratory effort normal. Cardiovascular system: S1 & S2 heard, RRR. No JVD, murmurs, rubs, gallops or clicks. No pedal edema. Gastrointestinal system: Abdomen is nondistended, soft and nontender. No organomegaly or masses felt. Normal bowel sounds heard. Central nervous system: Alert, unable to assess orientation.  Data Reviewed: I have personally reviewed following labs and imaging studies  CBC: No results for input(s): "WBC", "NEUTROABS", "HGB", "HCT", "MCV", "PLT" in the last 168 hours. Basic Metabolic Panel: Recent Labs  Lab 01/02/23 1041 01/06/23 0826  NA 142 139  K 5.2* 4.7  CL 106 108  CO2 27 22  GLUCOSE 80 69*  BUN 17 20  CREATININE 1.13* 1.07*  CALCIUM 9.1 8.3*   GFR: Estimated Creatinine Clearance: 41.8 mL/min (A) (by C-G  formula based on SCr of 1.07 mg/dL (H)). Liver Function Tests: No results for input(s): "AST", "ALT", "ALKPHOS", "BILITOT", "PROT", "ALBUMIN" in the last 168 hours. No results for input(s): "LIPASE", "AMYLASE" in the last 168 hours. No results for input(s): "AMMONIA" in the last 168 hours. Coagulation Profile: No results for input(s): "INR", "PROTIME" in the last 168 hours. Cardiac Enzymes: No results for input(s): "CKTOTAL", "CKMB", "CKMBINDEX", "TROPONINI" in the last 168 hours. BNP (last 3 results) No results for input(s): "PROBNP" in the last 8760 hours. HbA1C: No results for input(s): "HGBA1C" in the last 72 hours. CBG: No results for input(s): "GLUCAP" in the last 168 hours. Lipid Profile: No results for input(s): "CHOL", "HDL", "LDLCALC", "TRIG", "CHOLHDL", "LDLDIRECT" in the last 72  hours. Thyroid Function Tests: No results for input(s): "TSH", "T4TOTAL", "FREET4", "T3FREE", "THYROIDAB" in the last 72 hours. Anemia Panel: No results for input(s): "VITAMINB12", "FOLATE", "FERRITIN", "TIBC", "IRON", "RETICCTPCT" in the last 72 hours. Sepsis Labs: No results for input(s): "PROCALCITON", "LATICACIDVEN" in the last 168 hours.  No results found for this or any previous visit (from the past 240 hour(s)).   Radiology Studies: No results found.  Scheduled Meds:  (feeding supplement) PROSource Plus  30 mL Oral BID BM   vitamin B-12  500 mcg Oral Daily   enoxaparin (LOVENOX) injection  40 mg Subcutaneous Daily   feeding supplement  237 mL Oral BID BM   hydrocerin   Topical BID   LORazepam  1 mg Oral TID   LORazepam  1 mg Oral Once   multivitamin with minerals  1 tablet Oral Daily   OLANZapine zydis  10 mg Oral QHS   polyethylene glycol  17 g Oral Daily   senna-docusate  2 tablet Oral QHS   sodium zirconium cyclosilicate  10 g Oral Daily   Tdap  0.5 mL Intramuscular Once   Continuous Infusions:   LOS: 376 days   Hughie Closs, MD [facility]  01/06/2023, 12:59 PM   *Please note that this is a verbal dictation therefore any spelling or grammatical errors are due to the "Dragon Medical One" system interpretation.  Please page via Amion and do not message via secure chat for urgent patient care matters. Secure chat can be used for non urgent patient care matters.  How to contact the [facility] Attending or Consulting provider 7A - 7P or covering provider during after hours 7P -7A, for this patient?  Check the care team in [facility] and look for a) attending/consulting TRH provider listed and b) the [facility] team listed. Page or secure chat 7A-7P. Log into [email or web address] and use [place]'s universal password to access. If you do not have the password, please contact the hospital operator. Locate the [facility] provider you are looking for under [facility] and page to a number  that you can be directly reached. If you still have difficulty reaching the provider, please page the [facility] (Director on Call) for the Hospitalists listed on amion for assistance.

## 2023-01-06 NOTE — Plan of Care (Signed)

## 2023-01-07 NOTE — Plan of Care (Signed)

## 2023-01-07 NOTE — Progress Notes (Signed)
PROGRESS NOTE    Natalie Brown  WUJ:[id number] DOB: [birth year] DOA: 12/24/2021 PCP: Lavinia Sharps, NP   Brief Narrative:  57 y.o. 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    initially admitted to [facility] 08/2021 with agitation and aggressive behaviors.  Rx manic Bipolar, but after several months in [facility] noted significant cognitive impairment, and dementia was suspected.  Patient was observed to have significant short term memory impairment and worsening hygiene, MOCA 11/30 and CT head revealed advanced cerebral atrophy.     September 2023, patient was transferred for further evaluation and treatment due to concerns of new metabolic encephalopathy (that appear to have developed acutely over ~1 day).  Neurology and Psychiatry were consulted.  EEG was unremarkable.  Ammonia, TSH and RPR were normal.  MRI brain revealed no acute findings, but confirmed advanced atrophy.  Neurology suspected a particularly aggressive premature dementia.     Initially forgetful but redirectable and oriented, she is now abulic and essentially nonverbal (nursing note occasional staff or some family members can elicit more responses, but they are very limited).  Cannot bathe or toilet independently.  Patient holds food in the mouth, recent calorie count (7/26-7/29) was observed to eat 50% of meals or less.   Nursing report she does not swallow her pills regularly due to pocketing of pills or holding pills in the mouth for long periods.  In the last 6 months, she has had 22 kg weight loss and has become noticeably weaker and more prone to falls when attempting to walk (which she cannot do safely without assistance).   Palliative have been consulted and have met with family. Family are aware her dementia and weight loss are terminal.   Assessment & Plan:   Principal Problem:   Early onset Dementia with behavioral disturbance  (HCC) Active Problems:   Delirium due to multiple etiologies, acute, hyperactive   Bipolar affective disorder, current episode manic with psychotic symptoms (HCC)   Sinus tachycardia   Tobacco abuse   Altered mental state   B12 deficiency   COVID-19 virus infection   Drooling   Essential hypertension   Protein-calorie malnutrition, severe  Early onset dementia  Bipolar Disorder Ammonia, TSH, RPR normal.  EEG normal.  MRI brain shows advanced atrophy.   Neurology/psychiatry feel condition clearly irreversible at the time of first presentation and work up for autoimmune, paraneoplastic or prion diseases would have been futile. Mother and sister aware of terminal condition. weaned off of Haldol over the last 4 months with no discernible change in behavior. - Full supportive care - Palliative care has seen 11/18/22 - Psychiatry was following, signed off on 01/01/2023 but reengaged on 01/02/2023 due to repeated walking behavior and agitation.  They increased her olanzapine to 7.5 mg twice daily and then eventually consolidated it to 10 mg at bedtime on 01/04/2023.  [name] on lorazepam 1 mg 3 times daily and 1 mg every 6 hours as needed for agitation per psychiatry recommendations.  Psychiatry is following.  Severe protein calorie malnutrition has lost close to 60 pounds since October of last year and continues to lose weight - Continue nutrition supplements, MVI as much as able - Appreciate Palliative Care and dietitian consultations  - Medical condition is progressive, irreversible and terminal, thus it was previously determined that tube feeds and CPR are futile and should not be offered.  Patient pulls out all IV lines, and  there is a high degree of certainty that IV fluids would be difficult and futile as well.   Vitamin B12 Deficiency:   - Continue cyanocobalamin   Essential hypertension  BP normalized off medication   CKD Stage IIIa Most recent Cr had been stable Had been unable to  obtain blood draw recently   Hyperkalemia: 5.2 potassium again on the labs on 01/02/2023.  Started on [place] daily.  Labs were ordered for 01/04/2023 but it was never collected and somehow the order is now discontinued.  I will reorder BMP.  Discontinue Lokelma now.  DVT prophylaxis: enoxaparin (LOVENOX) injection 40 mg Start: 11/07/22 1000   Code Status: Do not attempt resuscitation (DNR) - Comfort care  Family Communication:  None present at bedside.  Status is: Inpatient Remains inpatient appropriate because: Unsafe disposition as of now.   Estimated body mass index is 15.87 kg/m as calculated from the following:   Height as of this encounter: 5\' 6"  (1.676 m).   Weight as of this encounter: 44.6 kg.    Nutritional Assessment: Body mass index is 15.87 kg/m.[name] Seen by dietician.  I agree with the assessment and plan as outlined below: Nutrition Status: Nutrition Problem: Severe Malnutrition Etiology: chronic illness (advanced dementia) Signs/Symptoms: energy intake < 75% for > or equal to 1 month, percent weight loss Percent weight loss: 15 % (in 3 months) Interventions: Calorie Count, Ensure Enlive (each supplement provides 350kcal and 20 grams of protein), MVI  . Skin Assessment: I have examined the patient's skin and I agree with the wound assessment as performed by the wound care RN as outlined below:    Consultants:  None  Procedures:  None  Antimicrobials:  Anti-infectives (From admission, onward)    None         Subjective: Seen and examined.  Again nonverbal.  Again with rocking movements.  Objective: Vitals:   01/06/23 1626 01/06/23 2059 01/07/23 0609 01/07/23 0615  BP: 106/85 100/75 (!) 158/141 (!) 129/90  Pulse: (!) 112 71 (!) 145 98  Resp: 18 18 18    Temp:  (!) 97.3 F (36.3 C) 98.5 F (36.9 C)   TempSrc:      SpO2: 100% 100% 100%   Weight:      Height:        Intake/Output Summary (Last 24 hours) at 01/07/2023 1537 Last data filed at  01/07/2023 0911 Gross per 24 hour  Intake 120 ml  Output --  Net 120 ml    Filed Weights   12/17/22 0518 12/27/22 0545 12/28/22 0513  Weight: 45.8 kg 45.6 kg 44.6 kg    Examination:  General exam: Appears calm and comfortable but has rocking movements. Respiratory system: Clear to auscultation. Respiratory effort normal. Cardiovascular system: S1 & S2 heard, RRR. No JVD, murmurs, rubs, gallops or clicks. No pedal edema. Gastrointestinal system: Abdomen is nondistended, soft and nontender. No organomegaly or masses felt. Normal bowel sounds heard. Central nervous system: Alert, unable to assess orientation.  Data Reviewed: I have personally reviewed following labs and imaging studies  CBC: No results for input(s): "WBC", "NEUTROABS", "HGB", "HCT", "MCV", "PLT" in the last 168 hours. Basic Metabolic Panel: Recent Labs  Lab 01/02/23 1041 01/06/23 0826 01/06/23 1514  NA 142 139 138  K 5.2* 4.7 4.4  CL 106 108 106  CO2 27 22 25   GLUCOSE 80 69* 85  BUN 17 20 17   CREATININE 1.13* 1.07* 1.06*  CALCIUM 9.1 8.3* 8.8*   GFR: Estimated Creatinine Clearance: 42.2 mL/min (  A) (by C-G formula based on SCr of 1.06 mg/dL (H)). Liver Function Tests: No results for input(s): "AST", "ALT", "ALKPHOS", "BILITOT", "PROT", "ALBUMIN" in the last 168 hours. No results for input(s): "LIPASE", "AMYLASE" in the last 168 hours. No results for input(s): "AMMONIA" in the last 168 hours. Coagulation Profile: No results for input(s): "INR", "PROTIME" in the last 168 hours. Cardiac Enzymes: No results for input(s): "CKTOTAL", "CKMB", "CKMBINDEX", "TROPONINI" in the last 168 hours. BNP (last 3 results) No results for input(s): "PROBNP" in the last 8760 hours. HbA1C: No results for input(s): "HGBA1C" in the last 72 hours. CBG: No results for input(s): "GLUCAP" in the last 168 hours. Lipid Profile: No results for input(s): "CHOL", "HDL", "LDLCALC", "TRIG", "CHOLHDL", "LDLDIRECT" in the last 72  hours. Thyroid Function Tests: No results for input(s): "TSH", "T4TOTAL", "FREET4", "T3FREE", "THYROIDAB" in the last 72 hours. Anemia Panel: No results for input(s): "VITAMINB12", "FOLATE", "FERRITIN", "TIBC", "IRON", "RETICCTPCT" in the last 72 hours. Sepsis Labs: No results for input(s): "PROCALCITON", "LATICACIDVEN" in the last 168 hours.  No results found for this or any previous visit (from the past 240 hour(s)).   Radiology Studies: No results found.  Scheduled Meds:  (feeding supplement) PROSource Plus  30 mL Oral BID BM   vitamin B-12  500 mcg Oral Daily   enoxaparin (LOVENOX) injection  40 mg Subcutaneous Daily   feeding supplement  237 mL Oral BID BM   hydrocerin   Topical BID   LORazepam  1 mg Oral TID   multivitamin with minerals  1 tablet Oral Daily   OLANZapine zydis  10 mg Oral QHS   polyethylene glycol  17 g Oral Daily   senna-docusate  2 tablet Oral QHS   Tdap  0.5 mL Intramuscular Once   Continuous Infusions:   LOS: 377 days   Hughie Closs, MD [facility]  01/07/2023, 3:37 PM   *Please note that this is a verbal dictation therefore any spelling or grammatical errors are due to the "Dragon Medical One" system interpretation.  Please page via Amion and do not message via secure chat for urgent patient care matters. Secure chat can be used for non urgent patient care matters.  How to contact the [facility] Attending or Consulting provider 7A - 7P or covering provider during after hours 7P -7A, for this patient?  Check the care team in [facility] and look for a) attending/consulting TRH provider listed and b) the [facility] team listed. Page or secure chat 7A-7P. Log into [email or web address] and use [place]'s universal password to access. If you do not have the password, please contact the hospital operator. Locate the [facility] provider you are looking for under [facility] and page to a number that you can be directly reached. If you still have difficulty reaching the  provider, please page the [facility] (Director on Call) for the Hospitalists listed on amion for assistance.

## 2023-01-07 NOTE — Plan of Care (Addendum)
This RN took over care for pt for this shift, pt not in restraints, pt bed alarm in place.

## 2023-01-08 NOTE — Plan of Care (Signed)

## 2023-01-08 NOTE — Progress Notes (Signed)
PROGRESS NOTE    Natalie Brown  NFA:[id number] DOB: 10-13-1965 DOA: 12/24/2021 PCP: Lavinia Sharps, NP   Brief Narrative:  57 y.o. 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    initially admitted to [facility] 08/2021 with agitation and aggressive behaviors.  Rx manic Bipolar, but after several months in [facility] noted significant cognitive impairment, and dementia was suspected.  Patient was observed to have significant short term memory impairment and worsening hygiene, MOCA 11/30 and CT head revealed advanced cerebral atrophy.     September 2023, patient was transferred for further evaluation and treatment due to concerns of new metabolic encephalopathy (that appear to have developed acutely over ~1 day).  Neurology and Psychiatry were consulted.  EEG was unremarkable.  Ammonia, TSH and RPR were normal.  MRI brain revealed no acute findings, but confirmed advanced atrophy.  Neurology suspected a particularly aggressive premature dementia.     Initially forgetful but redirectable and oriented, she is now abulic and essentially nonverbal (nursing note occasional staff or some family members can elicit more responses, but they are very limited).  Cannot bathe or toilet independently.  Patient holds food in the mouth, recent calorie count (7/26-7/29) was observed to eat 50% of meals or less.   Nursing report she does not swallow her pills regularly due to pocketing of pills or holding pills in the mouth for long periods.  In the last 6 months, she has had 22 kg weight loss and has become noticeably weaker and more prone to falls when attempting to walk (which she cannot do safely without assistance).   Palliative have been consulted and have met with family. Family are aware her dementia and weight loss are terminal.   Assessment & Plan:   Principal Problem:   Early onset Dementia with behavioral disturbance  (HCC) Active Problems:   Delirium due to multiple etiologies, acute, hyperactive   Bipolar affective disorder, current episode manic with psychotic symptoms (HCC)   Sinus tachycardia   Tobacco abuse   Altered mental state   B12 deficiency   COVID-19 virus infection   Drooling   Essential hypertension   Protein-calorie malnutrition, severe  Early onset dementia  Bipolar Disorder Ammonia, TSH, RPR normal.  EEG normal.  MRI brain shows advanced atrophy.   Neurology/psychiatry feel condition clearly irreversible at the time of first presentation and work up for autoimmune, paraneoplastic or prion diseases would have been futile. Mother and sister aware of terminal condition. weaned off of Haldol over the last 4 months with no discernible change in behavior. - Full supportive care - Palliative care has seen 11/18/22 - Psychiatry was following, signed off on 01/01/2023 but reengaged on 01/02/2023 due to repeated walking behavior and agitation.  They increased her olanzapine to 7.5 mg twice daily and then eventually consolidated it to 10 mg at bedtime on 01/04/2023.  [name] on lorazepam 1 mg 3 times daily and 1 mg every 6 hours as needed for agitation per psychiatry recommendations.  Psychiatry is following.  Severe protein calorie malnutrition has lost close to 60 pounds since October of last year and continues to lose weight - Continue nutrition supplements, MVI as much as able - Appreciate Palliative Care and dietitian consultations  - Medical condition is progressive, irreversible and terminal, thus it was previously determined that tube feeds and CPR are futile and should not be offered.  Patient pulls out all IV lines, and  there is a high degree of certainty that IV fluids would be difficult and futile as well.   Vitamin B12 Deficiency:   - Continue cyanocobalamin   Essential hypertension  BP normalized off medication   CKD Stage IIIa Most recent Cr had been stable Had been unable to  obtain blood draw recently   Hyperkalemia: 5.2 potassium again on the labs on 01/02/2023.  Started on Mellette daily.  Labs were ordered for 01/04/2023 but it was never collected and somehow the order is now discontinued.  I will reorder BMP.  Discontinue Lokelma now.  DVT prophylaxis: enoxaparin (LOVENOX) injection 40 mg Start: 11/07/22 1000   Code Status: Do not attempt resuscitation (DNR) - Comfort care  Family Communication:  None present at bedside.  Status is: Inpatient Remains inpatient appropriate because: Unsafe disposition as of now.   Estimated body mass index is 15.87 kg/m as calculated from the following:   Height as of this encounter: 5\' 6"  (1.676 m).   Weight as of this encounter: 44.6 kg.    Nutritional Assessment: Body mass index is 15.87 kg/m.[name] Seen by dietician.  I agree with the assessment and plan as outlined below: Nutrition Status: Nutrition Problem: Severe Malnutrition Etiology: chronic illness (advanced dementia) Signs/Symptoms: energy intake < 75% for > or equal to 1 month, percent weight loss Percent weight loss: 15 % (in 3 months) Interventions: Calorie Count, Ensure Enlive (each supplement provides 350kcal and 20 grams of protein), MVI  . Skin Assessment: I have examined the patient's skin and I agree with the wound assessment as performed by the wound care RN as outlined below:    Consultants:  None  Procedures:  None  Antimicrobials:  Anti-infectives (From admission, onward)    None         Subjective: Patient seen and examined.  Continues to have rocking movements.  Nonverbal.  Fixated on watching TV.  Objective: Vitals:   01/07/23 0609 01/07/23 0615 01/07/23 1300 01/07/23 2018  BP: (!) 158/141 (!) 129/90 115/72 (!) 136/111  Pulse: (!) 145 98  (!) 111  Resp: 18  19 18   Temp: 98.5 F (36.9 C)  98.2 F (36.8 C) 98 F (36.7 C)  TempSrc:   Axillary Oral  SpO2: 100%  100%   Weight:      Height:       No intake or output data  in the 24 hours ending 01/08/23 0954   Filed Weights   12/17/22 0518 12/27/22 0545 12/28/22 0513  Weight: 45.8 kg 45.6 kg 44.6 kg    Examination:  General exam: Appears calm and comfortable but has rocking movements. Respiratory system: Clear to auscultation. Respiratory effort normal. Cardiovascular system: S1 & S2 heard, RRR. No JVD, murmurs, rubs, gallops or clicks. No pedal edema. Gastrointestinal system: Abdomen is nondistended, soft and nontender. No organomegaly or masses felt. Normal bowel sounds heard. Central nervous system: Alert, unable to assess orientation.  Data Reviewed: I have personally reviewed following labs and imaging studies  CBC: No results for input(s): "WBC", "NEUTROABS", "HGB", "HCT", "MCV", "PLT" in the last 168 hours. Basic Metabolic Panel: Recent Labs  Lab 01/02/23 1041 01/06/23 0826 01/06/23 1514  NA 142 139 138  K 5.2* 4.7 4.4  CL 106 108 106  CO2 27 22 25   GLUCOSE 80 69* 85  BUN 17 20 17   CREATININE 1.13* 1.07* 1.06*  CALCIUM 9.1 8.3* 8.8*   GFR: Estimated Creatinine Clearance: 42.2 mL/min (A) (by C-G formula based on SCr of 1.06 mg/dL (  H)). Liver Function Tests: No results for input(s): "AST", "ALT", "ALKPHOS", "BILITOT", "PROT", "ALBUMIN" in the last 168 hours. No results for input(s): "LIPASE", "AMYLASE" in the last 168 hours. No results for input(s): "AMMONIA" in the last 168 hours. Coagulation Profile: No results for input(s): "INR", "PROTIME" in the last 168 hours. Cardiac Enzymes: No results for input(s): "CKTOTAL", "CKMB", "CKMBINDEX", "TROPONINI" in the last 168 hours. BNP (last 3 results) No results for input(s): "PROBNP" in the last 8760 hours. HbA1C: No results for input(s): "HGBA1C" in the last 72 hours. CBG: No results for input(s): "GLUCAP" in the last 168 hours. Lipid Profile: No results for input(s): "CHOL", "HDL", "LDLCALC", "TRIG", "CHOLHDL", "LDLDIRECT" in the last 72 hours. Thyroid Function Tests: No results  for input(s): "TSH", "T4TOTAL", "FREET4", "T3FREE", "THYROIDAB" in the last 72 hours. Anemia Panel: No results for input(s): "VITAMINB12", "FOLATE", "FERRITIN", "TIBC", "IRON", "RETICCTPCT" in the last 72 hours. Sepsis Labs: No results for input(s): "PROCALCITON", "LATICACIDVEN" in the last 168 hours.  No results found for this or any previous visit (from the past 240 hour(s)).   Radiology Studies: No results found.  Scheduled Meds:  (feeding supplement) PROSource Plus  30 mL Oral BID BM   vitamin B-12  500 mcg Oral Daily   enoxaparin (LOVENOX) injection  40 mg Subcutaneous Daily   feeding supplement  237 mL Oral BID BM   hydrocerin   Topical BID   LORazepam  1 mg Oral TID   multivitamin with minerals  1 tablet Oral Daily   OLANZapine zydis  10 mg Oral QHS   polyethylene glycol  17 g Oral Daily   senna-docusate  2 tablet Oral QHS   Tdap  0.5 mL Intramuscular Once   Continuous Infusions:   LOS: 378 days   Hughie Closs, MD [facility]  01/08/2023, 9:54 AM   *Please note that this is a verbal dictation therefore any spelling or grammatical errors are due to the "Dragon Medical One" system interpretation.  Please page via Amion and do not message via secure chat for urgent patient care matters. Secure chat can be used for non urgent patient care matters.  How to contact the [facility] Attending or Consulting provider 7A - 7P or covering provider during after hours 7P -7A, for this patient?  Check the care team in [facility] and look for a) attending/consulting TRH provider listed and b) the [facility] team listed. Page or secure chat 7A-7P. Log into [email or web address] and use [place]'s universal password to access. If you do not have the password, please contact the hospital operator. Locate the [facility] provider you are looking for under [facility] and page to a number that you can be directly reached. If you still have difficulty reaching the provider, please page the [facility] (Director on Call)  for the Hospitalists listed on amion for assistance.

## 2023-01-09 NOTE — Plan of Care (Signed)

## 2023-01-09 NOTE — Plan of Care (Signed)

## 2023-01-09 NOTE — Progress Notes (Signed)
PROGRESS NOTE    Natalie Brown  OZD:[id number] DOB: Mar 14, 1966 DOA: 12/24/2021 PCP: Lavinia Sharps, NP   Brief Narrative:  57 y.o. 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    initially admitted to [facility] 08/2021 with agitation and aggressive behaviors.  Rx manic Bipolar, but after several months in [facility] noted significant cognitive impairment, and dementia was suspected.  Patient was observed to have significant short term memory impairment and worsening hygiene, MOCA 11/30 and CT head revealed advanced cerebral atrophy.     September 2023, patient was transferred for further evaluation and treatment due to concerns of new metabolic encephalopathy (that appear to have developed acutely over ~1 day).  Neurology and Psychiatry were consulted.  EEG was unremarkable.  Ammonia, TSH and RPR were normal.  MRI brain revealed no acute findings, but confirmed advanced atrophy.  Neurology suspected a particularly aggressive premature dementia.     Initially forgetful but redirectable and oriented, she is now abulic and essentially nonverbal (nursing note occasional staff or some family members can elicit more responses, but they are very limited).  Cannot bathe or toilet independently.  Patient holds food in the mouth, recent calorie count (7/26-7/29) was observed to eat 50% of meals or less.   Nursing report she does not swallow her pills regularly due to pocketing of pills or holding pills in the mouth for long periods.  In the last 6 months, she has had 22 kg weight loss and has become noticeably weaker and more prone to falls when attempting to walk (which she cannot do safely without assistance).   Palliative have been consulted and have met with family. Family are aware her dementia and weight loss are terminal.   Assessment & Plan:   Principal Problem:   Early onset Dementia with behavioral disturbance  (HCC) Active Problems:   Delirium due to multiple etiologies, acute, hyperactive   Bipolar affective disorder, current episode manic with psychotic symptoms (HCC)   Sinus tachycardia   Tobacco abuse   Altered mental state   B12 deficiency   COVID-19 virus infection   Drooling   Essential hypertension   Protein-calorie malnutrition, severe  Early onset dementia  Bipolar Disorder Ammonia, TSH, RPR normal.  EEG normal.  MRI brain shows advanced atrophy.   Neurology/psychiatry feel condition clearly irreversible at the time of first presentation and work up for autoimmune, paraneoplastic or prion diseases would have been futile. Mother and sister aware of terminal condition. weaned off of Haldol over the last 4 months with no discernible change in behavior. - Full supportive care - Palliative care has seen 11/18/22 - Psychiatry was following, signed off on 01/01/2023 but reengaged on 01/02/2023 due to repeated walking behavior and agitation.  They increased her olanzapine to 7.5 mg twice daily and then eventually consolidated it to 10 mg at bedtime on 01/04/2023.  [name] on lorazepam 1 mg 3 times daily and 1 mg every 6 hours as needed for agitation per psychiatry recommendations.  Psychiatry is following.  Severe protein calorie malnutrition has lost close to 60 pounds since October of last year and continues to lose weight - Continue nutrition supplements, MVI as much as able - Appreciate Palliative Care and dietitian consultations  - Medical condition is progressive, irreversible and terminal, thus it was previously determined that tube feeds and CPR are futile and should not be offered.  Patient pulls out all IV lines, and  there is a high degree of certainty that IV fluids would be difficult and futile as well.   Vitamin B12 Deficiency:   - Continue cyanocobalamin   Essential hypertension  BP normalized off medication   CKD Stage IIIa Most recent Cr had been stable Had been unable to  obtain blood draw recently   Hyperkalemia: 5.2 potassium again on the labs on 01/02/2023.  Treated with 2 doses of Lokelma, potassium normal.  DVT prophylaxis: enoxaparin (LOVENOX) injection 40 mg Start: 11/07/22 1000   Code Status: Do not attempt resuscitation (DNR) - Comfort care  Family Communication:  None present at bedside.  Status is: Inpatient Remains inpatient appropriate because: Unsafe disposition as of now.   Estimated body mass index is 15.87 kg/m as calculated from the following:   Height as of this encounter: 5\' 6"  (1.676 m).   Weight as of this encounter: 44.6 kg.    Nutritional Assessment: Body mass index is 15.87 kg/m.[name] Seen by dietician.  I agree with the assessment and plan as outlined below: Nutrition Status: Nutrition Problem: Severe Malnutrition Etiology: chronic illness (advanced dementia) Signs/Symptoms: energy intake < 75% for > or equal to 1 month, percent weight loss Percent weight loss: 15 % (in 3 months) Interventions: Calorie Count, Ensure Enlive (each supplement provides 350kcal and 20 grams of protein), MVI  . Skin Assessment: I have examined the patient's skin and I agree with the wound assessment as performed by the wound care RN as outlined below:    Consultants:  None  Procedures:  None  Antimicrobials:  Anti-infectives (From admission, onward)    None         Subjective: Patient seen.  She was sleeping.  No rocking movements noted during sleep.  In order to prevent delirium, I did not wake her up.  Objective: Vitals:   01/07/23 1300 01/07/23 2018 01/08/23 1811 01/09/23 0527  BP: 115/72 (!) 136/111 100/63 92/69  Pulse:  (!) 111 99 (!) 59  Resp: 19 18 17 18   Temp: 98.2 F (36.8 C) 98 F (36.7 C) 98.2 F (36.8 C)   TempSrc: Axillary Oral Axillary   SpO2: 100%  100% 100%  Weight:      Height:        Intake/Output Summary (Last 24 hours) at 01/09/2023 0852 Last data filed at 01/08/2023 1700 Gross per 24 hour  Intake  240 ml  Output --  Net 240 ml     Filed Weights   12/17/22 0518 12/27/22 0545 12/28/22 0513  Weight: 45.8 kg 45.6 kg 44.6 kg    Examination:  General exam: Appears calm and comfortable and sleepy.  Data Reviewed: I have personally reviewed following labs and imaging studies  CBC: No results for input(s): "WBC", "NEUTROABS", "HGB", "HCT", "MCV", "PLT" in the last 168 hours. Basic Metabolic Panel: Recent Labs  Lab 01/02/23 1041 01/06/23 0826 01/06/23 1514  NA 142 139 138  K 5.2* 4.7 4.4  CL 106 108 106  CO2 27 22 25   GLUCOSE 80 69* 85  BUN 17 20 17   CREATININE 1.13* 1.07* 1.06*  CALCIUM 9.1 8.3* 8.8*   GFR: Estimated Creatinine Clearance: 42.2 mL/min (A) (by C-G formula based on SCr of 1.06 mg/dL (H)). Liver Function Tests: No results for input(s): "AST", "ALT", "ALKPHOS", "BILITOT", "PROT", "ALBUMIN" in the last 168 hours. No results for input(s): "LIPASE", "AMYLASE" in the last 168 hours. No results for input(s): "AMMONIA" in the last 168 hours. Coagulation Profile: No results for input(s): "INR", "PROTIME" in  the last 168 hours. Cardiac Enzymes: No results for input(s): "CKTOTAL", "CKMB", "CKMBINDEX", "TROPONINI" in the last 168 hours. BNP (last 3 results) No results for input(s): "PROBNP" in the last 8760 hours. HbA1C: No results for input(s): "HGBA1C" in the last 72 hours. CBG: No results for input(s): "GLUCAP" in the last 168 hours. Lipid Profile: No results for input(s): "CHOL", "HDL", "LDLCALC", "TRIG", "CHOLHDL", "LDLDIRECT" in the last 72 hours. Thyroid Function Tests: No results for input(s): "TSH", "T4TOTAL", "FREET4", "T3FREE", "THYROIDAB" in the last 72 hours. Anemia Panel: No results for input(s): "VITAMINB12", "FOLATE", "FERRITIN", "TIBC", "IRON", "RETICCTPCT" in the last 72 hours. Sepsis Labs: No results for input(s): "PROCALCITON", "LATICACIDVEN" in the last 168 hours.  No results found for this or any previous visit (from the past 240  hour(s)).   Radiology Studies: No results found.  Scheduled Meds:  (feeding supplement) PROSource Plus  30 mL Oral BID BM   vitamin B-12  500 mcg Oral Daily   enoxaparin (LOVENOX) injection  40 mg Subcutaneous Daily   feeding supplement  237 mL Oral BID BM   hydrocerin   Topical BID   LORazepam  1 mg Oral TID   multivitamin with minerals  1 tablet Oral Daily   OLANZapine zydis  10 mg Oral QHS   polyethylene glycol  17 g Oral Daily   senna-docusate  2 tablet Oral QHS   Tdap  0.5 mL Intramuscular Once   Continuous Infusions:   LOS: 379 days   Hughie Closs, MD [facility]  01/09/2023, 8:52 AM   *Please note that this is a verbal dictation therefore any spelling or grammatical errors are due to the "Dragon Medical One" system interpretation.  Please page via Amion and do not message via secure chat for urgent patient care matters. Secure chat can be used for non urgent patient care matters.  How to contact the [facility] Attending or Consulting provider 7A - 7P or covering provider during after hours 7P -7A, for this patient?  Check the care team in [facility] and look for a) attending/consulting TRH provider listed and b) the [facility] team listed. Page or secure chat 7A-7P. Log into [email or web address] and use [place]'s universal password to access. If you do not have the password, please contact the hospital operator. Locate the [facility] provider you are looking for under [facility] and page to a number that you can be directly reached. If you still have difficulty reaching the provider, please page the [facility] (Director on Call) for the Hospitalists listed on amion for assistance.

## 2023-01-10 NOTE — Progress Notes (Signed)
PROGRESS NOTE    Natalie Brown  ZHY:[id number] DOB: 09-Feb-1966 DOA: 12/24/2021 PCP: Lavinia Sharps, NP   Brief Narrative:  57 y.o. 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    initially admitted to [facility] 08/2021 with agitation and aggressive behaviors.  Rx manic Bipolar, but after several months in [facility] noted significant cognitive impairment, and dementia was suspected.  Patient was observed to have significant short term memory impairment and worsening hygiene, MOCA 11/30 and CT head revealed advanced cerebral atrophy.     September 2023, patient was transferred for further evaluation and treatment due to concerns of new metabolic encephalopathy (that appear to have developed acutely over ~1 day).  Neurology and Psychiatry were consulted.  EEG was unremarkable.  Ammonia, TSH and RPR were normal.  MRI brain revealed no acute findings, but confirmed advanced atrophy.  Neurology suspected a particularly aggressive premature dementia.     Initially forgetful but redirectable and oriented, she is now abulic and essentially nonverbal (nursing note occasional staff or some family members can elicit more responses, but they are very limited).  Cannot bathe or toilet independently.  Patient holds food in the mouth, recent calorie count (7/26-7/29) was observed to eat 50% of meals or less.   Nursing report she does not swallow her pills regularly due to pocketing of pills or holding pills in the mouth for long periods.  In the last 6 months, she has had 22 kg weight loss and has become noticeably weaker and more prone to falls when attempting to walk (which she cannot do safely without assistance).   Palliative have been consulted and have met with family. Family are aware her dementia and weight loss are terminal.   Assessment & Plan:   Principal Problem:   Early onset Dementia with behavioral disturbance  (HCC) Active Problems:   Delirium due to multiple etiologies, acute, hyperactive   Bipolar affective disorder, current episode manic with psychotic symptoms (HCC)   Sinus tachycardia   Tobacco abuse   Altered mental state   B12 deficiency   COVID-19 virus infection   Drooling   Essential hypertension   Protein-calorie malnutrition, severe  Early onset dementia  Bipolar Disorder Ammonia, TSH, RPR normal.  EEG normal.  MRI brain shows advanced atrophy.   Neurology/psychiatry feel condition clearly irreversible at the time of first presentation and work up for autoimmune, paraneoplastic or prion diseases would have been futile. Mother and sister aware of terminal condition. weaned off of Haldol over the last 4 months with no discernible change in behavior. - Full supportive care - Palliative care has seen 11/18/22 - Psychiatry was following, signed off on 01/01/2023 but reengaged on 01/02/2023 due to repeated walking behavior and agitation.  They increased her olanzapine to 7.5 mg twice daily and then eventually consolidated it to 10 mg at bedtime on 01/04/2023.  [name] on lorazepam 1 mg 3 times daily and 1 mg every 6 hours as needed for agitation per psychiatry recommendations.  Psychiatry is following.  Severe protein calorie malnutrition has lost close to 60 pounds since October of last year and continues to lose weight - Continue nutrition supplements, MVI as much as able - Appreciate Palliative Care and dietitian consultations  - Medical condition is progressive, irreversible and terminal, thus it was previously determined that tube feeds and CPR are futile and should not be offered.  Patient pulls out all IV lines, and  there is a high degree of certainty that IV fluids would be difficult and futile as well.   Vitamin B12 Deficiency:   - Continue cyanocobalamin   Essential hypertension  BP normalized off medication   CKD Stage IIIa Most recent Cr had been stable Had been unable to  obtain blood draw recently   Hyperkalemia: 5.2 potassium again on the labs on 01/02/2023.  Treated with 2 doses of Lokelma, potassium normal.  DVT prophylaxis: enoxaparin (LOVENOX) injection 40 mg Start: 11/07/22 1000   Code Status: Do not attempt resuscitation (DNR) - Comfort care  Family Communication:  None present at bedside.  Status is: Inpatient Remains inpatient appropriate because: Unsafe disposition as of now.   Estimated body mass index is 15.87 kg/m as calculated from the following:   Height as of this encounter: 5\' 6"  (1.676 m).   Weight as of this encounter: 44.6 kg.    Nutritional Assessment: Body mass index is 15.87 kg/m.[name] Seen by dietician.  I agree with the assessment and plan as outlined below: Nutrition Status: Nutrition Problem: Severe Malnutrition Etiology: chronic illness (advanced dementia) Signs/Symptoms: energy intake < 75% for > or equal to 1 month, percent weight loss Percent weight loss: 15 % (in 3 months) Interventions: Calorie Count, Ensure Enlive (each supplement provides 350kcal and 20 grams of protein), MVI  . Skin Assessment: I have examined the patient's skin and I agree with the wound assessment as performed by the wound care RN as outlined below:    Consultants:  None  Procedures:  None  Antimicrobials:  Anti-infectives (From admission, onward)    None         Subjective: Patient seen.  Yet again she was sleeping.  Objective: Vitals:   01/08/23 1811 01/09/23 0527 01/09/23 1300 01/09/23 1944  BP: 100/63 92/69 105/63 (!) 89/57  Pulse: 99 (!) 59  63  Resp: 17 18 17 18   Temp: 98.2 F (36.8 C)  97.9 F (36.6 C) 98.2 F (36.8 C)  TempSrc: Axillary  Axillary Oral  SpO2: 100% 100% 100%   Weight:      Height:        Intake/Output Summary (Last 24 hours) at 01/10/2023 1017 Last data filed at 01/09/2023 1242 Gross per 24 hour  Intake 716 ml  Output 1 ml  Net 715 ml     Filed Weights   12/17/22 0518 12/27/22 0545  12/28/22 0513  Weight: 45.8 kg 45.6 kg 44.6 kg    Examination:  General exam: Appears calm and comfortable and sleepy.  Data Reviewed: I have personally reviewed following labs and imaging studies  CBC: No results for input(s): "WBC", "NEUTROABS", "HGB", "HCT", "MCV", "PLT" in the last 168 hours. Basic Metabolic Panel: Recent Labs  Lab 01/06/23 0826 01/06/23 1514  NA 139 138  K 4.7 4.4  CL 108 106  CO2 22 25  GLUCOSE 69* 85  BUN 20 17  CREATININE 1.07* 1.06*  CALCIUM 8.3* 8.8*   GFR: Estimated Creatinine Clearance: 42.2 mL/min (A) (by C-G formula based on SCr of 1.06 mg/dL (H)). Liver Function Tests: No results for input(s): "AST", "ALT", "ALKPHOS", "BILITOT", "PROT", "ALBUMIN" in the last 168 hours. No results for input(s): "LIPASE", "AMYLASE" in the last 168 hours. No results for input(s): "AMMONIA" in the last 168 hours. Coagulation Profile: No results for input(s): "INR", "PROTIME" in the last 168 hours. Cardiac Enzymes: No results for input(s): "CKTOTAL", "CKMB", "CKMBINDEX", "TROPONINI" in the last 168 hours. BNP (last 3 results) No results for input(s): "  PROBNP" in the last 8760 hours. HbA1C: No results for input(s): "HGBA1C" in the last 72 hours. CBG: No results for input(s): "GLUCAP" in the last 168 hours. Lipid Profile: No results for input(s): "CHOL", "HDL", "LDLCALC", "TRIG", "CHOLHDL", "LDLDIRECT" in the last 72 hours. Thyroid Function Tests: No results for input(s): "TSH", "T4TOTAL", "FREET4", "T3FREE", "THYROIDAB" in the last 72 hours. Anemia Panel: No results for input(s): "VITAMINB12", "FOLATE", "FERRITIN", "TIBC", "IRON", "RETICCTPCT" in the last 72 hours. Sepsis Labs: No results for input(s): "PROCALCITON", "LATICACIDVEN" in the last 168 hours.  No results found for this or any previous visit (from the past 240 hour(s)).   Radiology Studies: No results found.  Scheduled Meds:  (feeding supplement) PROSource Plus  30 mL Oral BID BM   vitamin  B-12  500 mcg Oral Daily   enoxaparin (LOVENOX) injection  40 mg Subcutaneous Daily   feeding supplement  237 mL Oral BID BM   hydrocerin   Topical BID   LORazepam  1 mg Oral TID   multivitamin with minerals  1 tablet Oral Daily   OLANZapine zydis  10 mg Oral QHS   polyethylene glycol  17 g Oral Daily   senna-docusate  2 tablet Oral QHS   Tdap  0.5 mL Intramuscular Once   Continuous Infusions:   LOS: 380 days   Hughie Closs, MD [facility]  01/10/2023, 10:17 AM   *Please note that this is a verbal dictation therefore any spelling or grammatical errors are due to the "[employer]agon [employer] One" system interpretation.  Please page via Amion and do not message via secure chat for urgent patient care matters. Secure chat can be used for non urgent patient care matters.  How to contact the [facility] Attending or Consulting provider 7A - 7P or covering provider during after hours 7P -7A, for this patient?  Check the care team in [facility] and look for a) attending/consulting TRH provider listed and b) the [facility] team listed. Page or secure chat 7A-7P. Log into [email or web address] and use [place]'s universal password to access. If you do not have the password, please contact the hospital operator. Locate the [facility] provider you are looking for under [facility] and page to a number that you can be directly reached. If you still have difficulty reaching the provider, please page the [facility] (Director on Call) for the Hospitalists listed on amion for assistance.

## 2023-01-10 NOTE — TOC Progression Note (Signed)
Transition of Care [facility]) - Progression Note    Patient Details  Name: Natalie Brown MRN: [id number] Date of Birth: 01/16/1966  [facility] CM/SW Contact  Inis Sizer, LCSW Phone Number: 01/10/2023, 1:24 PM  Clinical Narrative:    There are no discharge planning updates available at this time.   CSW spoke with patient's mother Natalie Brown to obtain updates. Natalie Brown states she has attempted to reach staff at [place] apartment complex without success. Natalie Brown states her daughter Natalie Brown is assisting her in obtaining a two bedroom apartment. Natalie Brown states she is interested in obtaining a handicap accessible apartment. Natalie Brown plans to go by the apartment complex today to determine what units are available.   Barriers to Discharge: Financial Resources, Inadequate or no insurance, Homeless with medical needs  Expected Discharge Plan and Services In-house Referral: Clinical Social Work   Post Acute Care Choice: Nursing Home Living arrangements for the past 2 months: Homeless [facility]) Expected Discharge Date: 12/24/21                                     Social Determinants of Health (SDOH) Interventions SDOH Screenings   Food Insecurity: No Food Insecurity (03/05/2022)  Housing: High Risk (03/21/2022)  Alcohol Screen: Low Risk  (09/06/2021)  Depression (PHQ2-9): Medium Risk (05/26/2020)  Tobacco Use: High Risk (03/21/2022)    Readmission Risk Interventions     No data to display

## 2023-01-11 NOTE — Progress Notes (Signed)
PROGRESS NOTE    Natalie Brown  ZOX:[id number]  DOB: Feb 07, 1966  DOA: 12/24/2021 PCP: Lavinia Sharps, NP Outpatient Specialists:   Hospital course:  57 y.o. 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    initially admitted to [facility] 08/2021 with agitation and aggressive behaviors.  Rx manic Bipolar, but after several months in [facility] noted significant cognitive impairment, and dementia was suspected.  Patient was observed to have significant short term memory impairment and worsening hygiene, MOCA 11/30 and CT head revealed advanced cerebral atrophy.     September 2023, patient was transferred for further evaluation and treatment due to concerns of new metabolic encephalopathy (that appear to have developed acutely over ~1 day).  Neurology and Psychiatry were consulted.  EEG was unremarkable.  Ammonia, TSH and RPR were normal.  MRI brain revealed no acute findings, but confirmed advanced atrophy.  Neurology suspected a particularly aggressive premature dementia.     Initially forgetful but redirectable and oriented, she is now abulic and essentially nonverbal (nursing note occasional staff or some family members can elicit more responses, but they are very limited).  Cannot bathe or toilet independently.  Patient holds food in the mouth, recent calorie count (7/26-7/29) was observed to eat 50% of meals or less.   Nursing report she does not swallow her pills regularly due to pocketing of pills or holding pills in the mouth for long periods.  In the last 6 months, she has had 22 kg weight loss and has become noticeably weaker and more prone to falls when attempting to walk (which she cannot do safely without assistance)   Subjective:  Patient is nonverbal but is awake and alert.  [name] trying to straighten out her covers.  She does not respond to me when I ask her if I can help her.  She does look  at me briefly and then ignores me.   Objective: Vitals:   01/10/23 1945 01/10/23 2014 01/11/23 0438 01/11/23 0535  BP: (!) 82/52 100/67 (!) 89/59 135/78  Pulse: 85 76 (!) 42 79  Resp: 17  17 16   Temp: 97.8 F (36.6 C)  (!) 97.4 F (36.3 C) 98.4 F (36.9 C)  TempSrc:   Oral   SpO2: 96%  96% 98%  Weight:      Height:        Intake/Output Summary (Last 24 hours) at 01/11/2023 1535 Last data filed at 01/11/2023 1254 Gross per 24 hour  Intake 315 ml  Output --  Net 315 ml   Filed Weights   12/17/22 0518 12/27/22 0545 12/28/22 0513  Weight: 45.8 kg 45.6 kg 44.6 kg     Exam:  General: Patient awake and alert, sitting up in bed arranging the covers to place around her feet. Eyes: sclera anicteric, conjuctiva mild injection bilaterally CVS: S1-S2, regular  Respiratory:  decreased air entry bilaterally secondary to decreased inspiratory effort, rales at bases  GI: NABS, soft, NT  LE: Warm and well-perfused  Data Reviewed:  Basic Metabolic Panel: Recent Labs  Lab 01/06/23 0826 01/06/23 1514  NA 139 138  K 4.7 4.4  CL 108 106  CO2 22 25  GLUCOSE 69* 85  BUN 20 17  CREATININE 1.07* 1.06*  CALCIUM 8.3* 8.8*    CBC: No results for input(s): "WBC", "NEUTROABS", "HGB", "HCT", "MCV", "PLT" in the last 168 hours.   Scheduled Meds:  (feeding supplement) PROSource Plus  30 mL Oral BID BM   vitamin B-12  500 mcg Oral Daily   enoxaparin (LOVENOX) injection  40 mg Subcutaneous Daily   feeding supplement  237 mL Oral BID BM   hydrocerin   Topical BID   LORazepam  1 mg Oral TID   multivitamin with minerals  1 tablet Oral Daily   OLANZapine zydis  10 mg Oral QHS   polyethylene glycol  17 g Oral Daily   senna-docusate  2 tablet Oral QHS   Tdap  0.5 mL Intramuscular Once   Continuous Infusions:   Assessment & Plan:   Notes reviewed, patient seen and assessed. Patient seems to be responding to present treatment, she appears comfortable although not communicative Will  continue present management as already established Copied and pasted from previous notes:  Early onset dementia Bipolar disorder Ammonia, TSH, RPR normal.  EEG normal.  MRI brain shows advanced atrophy.   Neurology/psychiatry feel condition clearly irreversible at the time of first presentation and work up for autoimmune, paraneoplastic or prion diseases would have been futile. Mother and sister aware of terminal condition. weaned off of Haldol over the last 4 months with no discernible change in behavior. - Full supportive care - Palliative care has seen 11/18/22 - Psychiatry was following, signed off on 01/01/2023 but reengaged on 01/02/2023 due to repeated walking behavior and agitation.  They increased her olanzapine to 7.5 mg twice daily and then eventually consolidated it to 10 mg at bedtime on 01/04/2023.  [name] on lorazepam 1 mg 3 times daily and 1 mg every 6 hours as needed for agitation per psychiatry recommendations.  Psychiatry is following.   Severe protein calorie malnutrition has lost close to 60 pounds since October of last year and continues to lose weight - Continue nutrition supplements, MVI as much as able - Appreciate Palliative Care and dietitian consultations  - Medical condition is progressive, irreversible and terminal, thus it was previously determined that tube feeds and CPR are futile and should not be offered.  Patient pulls out all IV lines, and there is a high degree of certainty that IV fluids would be difficult and futile as well.   Vitamin B12 Deficiency:   - Continue cyanocobalamin   Essential hypertension  BP normalized off medication   CKD Stage IIIa Most recent Cr had been stable Had been unable to obtain blood draw recently     DVT prophylaxis: Lovenox Code Status: Full Family Communication: None today     Studies: No results found.  Principal Problem:   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) Active Problems:   Delirium  due to multiple etiologies, acute, hyperactive   Bipolar affective disorder, current episode manic with psychotic symptoms (HCC)   Sinus tachycardia   Tobacco abuse   Altered mental state   B12 deficiency   COVID-19 virus infection   Drooling   Essential hypertension   Protein-calorie malnutrition, severe     Lindia Garms Tublu Adelee Hannula, [facility]  If 7PM-7AM, please contact night-coverage [email or web address]   LOS: 381 days

## 2023-01-11 NOTE — Plan of Care (Signed)
Mom and sister visited [place] today. They brought in [employer] and doughnuts for her. She ate all meals today. She went to the bathroom multiple times today with assistance and walked the hall. While her family was visiting Charday was dancing in the room and talking with her sister and staff. She was able to tell me she was the oldest sibling between her and her sister, her sister name, and who her nephew was.

## 2023-01-11 NOTE — Plan of Care (Signed)

## 2023-01-12 NOTE — Progress Notes (Signed)
PROGRESS NOTE    Natalie Brown  SAY:[id number]  DOB: 1966-02-11  DOA: 12/24/2021 PCP: Lavinia Sharps, NP Outpatient Specialists:   Hospital course:  57 y.o. 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    initially admitted to [facility] 08/2021 with agitation and aggressive behaviors.  Rx manic Bipolar, but after several months in [facility] noted significant cognitive impairment, and dementia was suspected.  Patient was observed to have significant short term memory impairment and worsening hygiene, MOCA 11/30 and CT head revealed advanced cerebral atrophy.     September 2023, patient was transferred for further evaluation and treatment due to concerns of new metabolic encephalopathy (that appear to have developed acutely over ~1 day).  Neurology and Psychiatry were consulted.  EEG was unremarkable.  Ammonia, TSH and RPR were normal.  MRI brain revealed no acute findings, but confirmed advanced atrophy.  Neurology suspected a particularly aggressive premature dementia.     Initially forgetful but redirectable and oriented, she is now abulic and essentially nonverbal (nursing note occasional staff or some family members can elicit more responses, but they are very limited).  Cannot bathe or toilet independently.  Patient holds food in the mouth, recent calorie count (7/26-7/29) was observed to eat 50% of meals or less.   Nursing report she does not swallow her pills regularly due to pocketing of pills or holding pills in the mouth for long periods.  In the last 6 months, she has had 22 kg weight loss and has become noticeably weaker and more prone to falls when attempting to walk (which she cannot do safely without assistance)   Subjective:  Patient is sitting up in bed feeding herself.  She has truncal ataxia.  She does not make eye contact with me and does not indicate that she notices that I am there  even when I speak to her.  She appears comfortable.   Objective: Vitals:   01/11/23 0438 01/11/23 0535 01/11/23 1609 01/11/23 2005  BP: (!) 89/59 135/78 (!) 107/58 96/80  Pulse: (!) 42 79 72 90  Resp: 17 16 18 18   Temp: (!) 97.4 F (36.3 C) 98.4 F (36.9 C) 99.1 F (37.3 C) 98.3 F (36.8 C)  TempSrc: Oral  Axillary Oral  SpO2: 96% 98% 100% 98%  Weight:      Height:        Intake/Output Summary (Last 24 hours) at 01/12/2023 1336 Last data filed at 01/12/2023 1100 Gross per 24 hour  Intake 420 ml  Output --  Net 420 ml   Filed Weights   12/17/22 0518 12/27/22 0545 12/28/22 0513  Weight: 45.8 kg 45.6 kg 44.6 kg     Exam:  General: Patient awake and alert, sitting up in bed feeding herself with truncal ataxia Eyes: sclera anicteric, conjuctiva mild injection bilaterally CVS: S1-S2, regular  Respiratory:  decreased air entry bilaterally secondary to decreased inspiratory effort, rales at bases  GI: NABS, soft, NT  LE: Warm and well-perfused  Data Reviewed:  Basic Metabolic Panel: Recent Labs  Lab 01/06/23 0826 01/06/23 1514  NA 139 138  K 4.7 4.4  CL 108 106  CO2 22 25  GLUCOSE 69* 85  BUN 20 17  CREATININE 1.07* 1.06*  CALCIUM 8.3* 8.8*    CBC: No results for input(s): "WBC", "NEUTROABS", "HGB", "HCT", "MCV", "PLT" in the last 168 hours.   Scheduled Meds:  (feeding supplement) PROSource Plus  30 mL Oral BID BM   vitamin B-12  500 mcg Oral Daily   enoxaparin (LOVENOX) injection  40 mg Subcutaneous Daily   feeding supplement  237 mL Oral BID BM   hydrocerin   Topical BID   LORazepam  1 mg Oral TID   multivitamin with minerals  1 tablet Oral Daily   OLANZapine zydis  10 mg Oral QHS   polyethylene glycol  17 g Oral Daily   senna-docusate  2 tablet Oral QHS   Tdap  0.5 mL Intramuscular Once   Continuous Infusions:   Assessment & Plan:   Notes reviewed, patient seen and assessed. Patient seems to be responding to present treatment, she appears  comfortable although not communicative Will continue present management as already established Copied and pasted from previous notes:  Early onset dementia Bipolar disorder Ammonia, TSH, RPR normal.  EEG normal.  MRI brain shows advanced atrophy.   Neurology/psychiatry feel condition clearly irreversible at the time of first presentation and work up for autoimmune, paraneoplastic or prion diseases would have been futile. Mother and sister aware of terminal condition. weaned off of Haldol over the last 4 months with no discernible change in behavior. - Full supportive care - Palliative care has seen 11/18/22 - Psychiatry was following, signed off on 01/01/2023 but reengaged on 01/02/2023 due to repeated walking behavior and agitation.  They increased her olanzapine to 7.5 mg twice daily and then eventually consolidated it to 10 mg at bedtime on 01/04/2023.  [name] on lorazepam 1 mg 3 times daily and 1 mg every 6 hours as needed for agitation per psychiatry recommendations.  Psychiatry is following.   Severe protein calorie malnutrition has lost close to 60 pounds since October of last year and continues to lose weight - Continue nutrition supplements, MVI as much as able - Appreciate Palliative Care and dietitian consultations  - Medical condition is progressive, irreversible and terminal, thus it was previously determined that tube feeds and CPR are futile and should not be offered.  Patient pulls out all IV lines, and there is a high degree of certainty that IV fluids would be difficult and futile as well.   Vitamin B12 Deficiency:   - Continue cyanocobalamin   Essential hypertension  BP normalized off medication   CKD Stage IIIa Most recent Cr had been stable Had been unable to obtain blood draw recently     DVT prophylaxis: Lovenox Code Status: Full Family Communication: None today     Studies: No results found.  Principal Problem:   Early onset Dementia with behavioral  disturbance (HCC) Active Problems:   Delirium due to multiple etiologies, acute, hyperactive   Bipolar affective disorder, current episode manic with psychotic symptoms (HCC)   Sinus tachycardia   Tobacco abuse   Altered mental state   B12 deficiency   COVID-19 virus infection   Drooling   Essential hypertension   Protein-calorie malnutrition, severe     Aideliz Garmany Tublu Seger Jani, [facility]  If 7PM-7AM, please contact night-coverage [email or web address]   LOS: 382 days

## 2023-01-12 NOTE — Plan of Care (Signed)

## 2023-01-13 NOTE — Plan of Care (Signed)

## 2023-01-13 NOTE — Progress Notes (Signed)
PROGRESS NOTE  Natalie Brown  ZHY:[id number]  DOB: 1966/02/10  DOA: 12/24/2021 PCP: Lavinia Sharps, NP  Hospital course: 57 y.o. 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    initially admitted to [facility] 08/2021 with agitation and aggressive behaviors.  Rx manic Bipolar, but after several months in [facility] noted significant cognitive impairment, and dementia was suspected.  Patient was observed to have significant short term memory impairment and worsening hygiene, MOCA 11/30 and CT head revealed advanced cerebral atrophy.     September 2023, patient was transferred for further evaluation and treatment due to concerns of new metabolic encephalopathy (that appear to have developed acutely over ~1 day).  Neurology and Psychiatry were consulted.  EEG was unremarkable.  Ammonia, TSH and RPR were normal.  MRI brain revealed no acute findings, but confirmed advanced atrophy.  Neurology suspected a particularly aggressive premature dementia.     Initially forgetful but redirectable and oriented, she is now abulic and essentially nonverbal (nursing note occasional staff or some family members can elicit more responses, but they are very limited).  Cannot bathe or toilet independently.  Patient holds food in the mouth, recent calorie count (7/26-7/29) was observed to eat 50% of meals or less.   Nursing report she does not swallow her pills regularly due to pocketing of pills or holding pills in the mouth for long periods.  In the last 6 months, she has had 22 kg weight loss and has become noticeably weaker and more prone to falls when attempting to walk (which she cannot do safely without assistance)  [name] currently stable and awaiting discharge planning. TOC is engaged and following up with family.   Subjective: Patient does not respond to my questions or greeting. Appears to be sleeping comfortably.    Objective: Vitals:   01/11/23 1609 01/11/23 2005 01/12/23 1735 01/12/23 2032  BP: (!) 107/58 96/80 (!) 92/53 (!) 87/58  Pulse: 72 90 88 73  Resp: 18 18 18 18   Temp: 99.1 F (37.3 C) 98.3 F (36.8 C) 97.6 F (36.4 C) (!) 97.4 F (36.3 C)  TempSrc: Axillary Oral  Oral  SpO2: 100% 98% 100% 99%  Weight:      Height:        Intake/Output Summary (Last 24 hours) at 01/13/2023 0740 Last data filed at 01/12/2023 1100 Gross per 24 hour  Intake 180 ml  Output --  Net 180 ml   Filed Weights   12/17/22 0518 12/27/22 0545 12/28/22 0513  Weight: 45.8 kg 45.6 kg 44.6 kg   Exam: General: Patient asleep and curled up in bed, unable to wake with verbal or gentle touch. NAD CVS: S1-S2, regular  Respiratory:  decreased air entry bilaterally secondary to decreased inspiratory effort, CTAB LE: Warm and well-perfused Skin: no wounds or lesions on exposed skin  Data Reviewed:  Basic Metabolic Panel: Recent Labs  Lab 01/06/23 0826 01/06/23 1514  NA 139 138  K 4.7 4.4  CL 108 106  CO2 22 25  GLUCOSE 69* 85  BUN 20 17  CREATININE 1.07* 1.06*  CALCIUM 8.3* 8.8*    CBC: No results for input(s): "WBC", "NEUTROABS", "HGB", "HCT", "MCV", "PLT" in the last 168 hours.   Scheduled Meds:  (feeding supplement) PROSource Plus  30 mL Oral BID BM   vitamin B-12  500 mcg Oral Daily   enoxaparin (LOVENOX) injection  40 mg Subcutaneous Daily   feeding  supplement  237 mL Oral BID BM   hydrocerin   Topical BID   LORazepam  1 mg Oral TID   multivitamin with minerals  1 tablet Oral Daily   OLANZapine zydis  10 mg Oral QHS   polyethylene glycol  17 g Oral Daily   senna-docusate  2 tablet Oral QHS   Tdap  0.5 mL Intramuscular Once   Assessment & Plan:   Notes reviewed, patient seen and assessed. Patient seems to be responding to present treatment, she appears comfortable although not communicative Will continue present management as already established Copied and pasted from previous  notes:  Early onset dementia Bipolar disorder Ammonia, TSH, RPR normal.  EEG normal.  MRI brain shows advanced atrophy.   Neurology/psychiatry feel condition clearly irreversible at the time of first presentation and work up for autoimmune, paraneoplastic or prion diseases would have been futile. Mother and sister aware of terminal condition. weaned off of Haldol over several months with no discernible change in behavior. - Full supportive care - Palliative care has seen 11/18/22 - Psychiatry was following, signed off on 01/01/2023 but reengaged on 01/02/2023 due to repeated walking behavior and agitation.  They increased her olanzapine to 7.5 mg twice daily and then eventually consolidated it to 10 mg at bedtime on 01/04/2023.  [name] on lorazepam 1 mg 3 times daily and 1 mg every 6 hours as needed for agitation per psychiatry recommendations.  Psychiatry is following.   Severe protein calorie malnutrition has lost close to 60 pounds since October of last year and continues to lose weight - Continue nutrition supplements, MVI as much as able - Appreciate Palliative Care and dietitian consultations  - Medical condition is progressive, irreversible and terminal, thus it was previously determined that tube feeds and CPR are futile and should not be offered.  Patient pulls out all IV lines, and there is a high degree of certainty that IV fluids would be difficult and futile as well.   Vitamin B12 Deficiency:   - Continue cyanocobalamin   Essential hypertension  BP normalized off medication   CKD Stage IIIa- stable with Cr around 1 for past several months Most recent Cr had been stable  DVT prophylaxis: Lovenox Code Status: Full Family Communication: None today   Studies: No results found.  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

## 2023-01-14 NOTE — Plan of Care (Signed)

## 2023-01-14 NOTE — TOC Progression Note (Signed)
Transition of Care [facility]) - Progression Note    Patient Details  Name: Natalie Brown MRN: [id number] Date of Birth: April 03, 1966  [facility] CM/SW Contact  Inis Sizer, LCSW Phone Number: 01/14/2023, 10:41 AM  Clinical Narrative:    CSW spoke with Ardyth Gal who requested clinicals be sent to her via secure email.  CSW compiled clinicals and sent them to [place] for review via secure email.     Barriers to Discharge: Financial Resources, Inadequate or no insurance, Homeless with medical needs  Expected Discharge Plan and Services In-house Referral: Clinical Social Work   Post Acute Care Choice: Nursing Home Living arrangements for the past 2 months: Homeless [name]) Expected Discharge Date: 12/24/21                                     Social Determinants of Health (SDOH) Interventions SDOH Screenings   Food Insecurity: No Food Insecurity (03/05/2022)  Housing: High Risk (03/21/2022)  Alcohol Screen: Low Risk  (09/06/2021)  Depression (PHQ2-9): Medium Risk (05/26/2020)  Tobacco Use: High Risk (03/21/2022)    Readmission Risk Interventions     No data to display

## 2023-01-14 NOTE — Progress Notes (Signed)
PROGRESS NOTE  Natalie Brown  ZHY:[id number]  DOB: 1965/09/27  DOA: 12/24/2021 PCP: Natalie Sharps, NP  Hospital course: 57 y.o. 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    initially admitted to [facility] 08/2021 with agitation and aggressive behaviors.  Rx manic Bipolar, but after several months in [facility] noted significant cognitive impairment, and dementia was suspected.  Patient was observed to have significant short term memory impairment and worsening hygiene, MOCA 11/30 and CT head revealed advanced cerebral atrophy.     September 2023, patient was transferred for further evaluation and treatment due to concerns of new metabolic encephalopathy (that appear to have developed acutely over ~1 day).  Neurology and Psychiatry were consulted.  EEG was unremarkable.  Ammonia, TSH and RPR were normal.  MRI brain revealed no acute findings, but confirmed advanced atrophy.  Neurology suspected a particularly aggressive premature dementia.     Initially forgetful but redirectable and oriented, she is now abulic and essentially nonverbal (nursing note occasional staff or some family members can elicit more responses, but they are very limited).  Cannot bathe or toilet independently.  Patient holds food in the mouth, recent calorie count (7/26-7/29) was observed to eat 50% of meals or less.   Nursing report she does not swallow her pills regularly due to pocketing of pills or holding pills in the mouth for long periods.  In the last 6 months, she has had 22 kg weight loss and has become noticeably weaker and more prone to falls when attempting to walk (which she cannot do safely without assistance)  [name] currently stable and awaiting discharge planning. TOC is engaged and following up with family.   Subjective: Patient alert and acknowledges my presence by following me with eyes and reaching out toward me but  does not verbally respond to anything. She waves goodbye to me when I tell her goodbye.   Objective: Vitals:   01/11/23 2005 01/12/23 1735 01/12/23 2032 01/14/23 0456  BP:  (!) 92/53 (!) 87/58 (!) 93/59  Pulse: 90 88 73 65  Resp: 18 18 18 16   Temp: 98.3 F (36.8 C) 97.6 F (36.4 C) (!) 97.4 F (36.3 C) 97.6 F (36.4 C)  TempSrc: Oral  Oral   SpO2: 98% 100% 99% 100%  Weight:      Height:       No intake or output data in the 24 hours ending 01/14/23 0727  Filed Weights   12/17/22 0518 12/27/22 0545 12/28/22 0513  Weight: 45.8 kg 45.6 kg 44.6 kg   Exam: General: Patient awake and laying in bed. NAD CVS: S1-S2, regular  Respiratory:  normal respiratory effort. CTAB LE: Warm and well-perfused Skin: no wounds or lesions on exposed skin  Data Reviewed:  Basic Metabolic Panel: No results for input(s): "NA", "K", "CL", "CO2", "GLUCOSE", "BUN", "CREATININE", "CALCIUM", "MG", "PHOS" in the last 168 hours.   CBC: No results for input(s): "WBC", "NEUTROABS", "HGB", "HCT", "MCV", "PLT" in the last 168 hours.   Scheduled Meds:  (feeding supplement) PROSource Plus  30 mL Oral BID BM   vitamin B-12  500 mcg Oral Daily   enoxaparin (LOVENOX) injection  40 mg Subcutaneous Daily   feeding supplement  237 mL Oral BID BM   hydrocerin   Topical BID   LORazepam  1 mg Oral TID   multivitamin with minerals  1 tablet Oral Daily   OLANZapine zydis  10 mg Oral QHS   polyethylene glycol  17 g Oral Daily   Tdap  0.5 mL Intramuscular Once   Assessment & Plan:   Notes reviewed, patient seen and assessed. Patient seems to be responding to present treatment, she appears comfortable although not communicative Will continue present management as already established Copied and pasted from previous notes:  Early onset dementia Bipolar disorder Ammonia, TSH, RPR normal.  EEG normal.  MRI brain shows advanced atrophy.   Neurology/psychiatry feel condition clearly irreversible at the time of  first presentation and work up for autoimmune, paraneoplastic or prion diseases would have been futile. Mother and sister aware of terminal condition. weaned off of Haldol over several months with no discernible change in behavior. - Full supportive care - Palliative care has seen 11/18/22 - Psychiatry was following, signed off on 01/01/2023 but reengaged on 01/02/2023 due to repeated walking behavior and agitation.  They increased her olanzapine to 7.5 mg twice daily and then eventually consolidated it to 10 mg at bedtime on 01/04/2023.  [name] on lorazepam 1 mg 3 times daily and 1 mg every 6 hours as needed for agitation per psychiatry recommendations.  Psychiatry is following.   Severe protein calorie malnutrition has lost close to 60 pounds since October of last year and continues to lose weight - Continue nutrition supplements, MVI as much as able - Appreciate Palliative Care and dietitian consultations  - Medical condition is progressive, irreversible and terminal, thus it was previously determined that tube feeds and CPR are futile and should not be offered.  Patient pulls out all IV lines, and there is a high degree of certainty that IV fluids would be difficult and futile as well.   Vitamin B12 Deficiency:   - Continue cyanocobalamin   Essential hypertension  BP normalized off medication   CKD Stage IIIa- stable with Cr around 1 for past several months Most recent Cr had been stable  DVT prophylaxis: Lovenox Code Status: Full Family Communication: None today   Studies: No results found.  Principal Problem:   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) Active Problems:   Delirium due to multiple etiologies, acute, hyperactive   Bipolar affective disorder, current episode manic with psychotic symptoms (HCC)   Sinus tachycardia   Tobacco abuse   Altered mental state   B12 deficiency   COVID-19 virus infection   Drooling   Essential hypertension   Protein-calorie  malnutrition, severe     Natalie Lightcap Jannette Fogo, DO [facility]  If 7PM-7AM, please contact night-coverage [email or web address]   LOS: 384 days

## 2023-01-15 NOTE — Progress Notes (Signed)
PROGRESS NOTE    Natalie Brown  ZOX:[id number] DOB: 1965-08-11 DOA: 12/24/2021 PCP: Lavinia Sharps, NP   Brief Narrative: 57 y.o. 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    initially admitted to [facility] 08/2021 with agitation and aggressive behaviors.  Rx manic Bipolar, but after several months in [facility] noted significant cognitive impairment, and dementia was suspected.  Patient was observed to have significant short term memory impairment and worsening hygiene, MOCA 11/30 and CT head revealed advanced cerebral atrophy.     September 2023, patient was transferred for further evaluation and treatment due to concerns of new metabolic encephalopathy (that appear to have developed acutely over ~1 day).  Neurology and Psychiatry were consulted.  EEG was unremarkable.  Ammonia, TSH and RPR were normal.  MRI brain revealed no acute findings, but confirmed advanced atrophy.  Neurology suspected a particularly aggressive premature dementia.     Initially forgetful but redirectable and oriented, she is now abulic and essentially nonverbal (nursing note occasional staff or some family members can elicit more responses, but they are very limited).  Cannot bathe or toilet independently.  Patient holds food in the mouth, recent calorie count (7/26-7/29) was observed to eat 50% of meals or less.   Nursing report she does not swallow her pills regularly due to pocketing of pills or holding pills in the mouth for long periods.  In the last 6 months, she has had 22 kg weight loss and has become noticeably weaker and more prone to falls when attempting to walk (which she cannot do safely without assistance).   Palliative have been consulted and have met with family. Family are aware her dementia and weight loss are terminal.      Assessment and Plan:  Early onset dementia Noted.  Palliative care consulted patient  has been transitioned to comfort measures.  Bipolar disorder Psychiatry consult this admission.  Olanzapine dosing adjusted per psychiatry recommendations.  Patient is also on lorazepam scheduled in addition to as needed.  Severe protein calorie malnutrition Severe weight loss since admission from September 2023.  Vitamin B12 deficiency -Continue vitamin B12 supplementation  CKD stage IIIa Stable.  Primary hypertension No longer on medical therapy.   DVT prophylaxis: Lovenox Code Status:   Code Status: Do not attempt resuscitation (DNR) - Comfort care Family Communication: None at bedside. Called mother via telephone with no response x2 Disposition Plan: Discharge to nursing home when bed is available   Consultants:    Procedures:    Antimicrobials:     Subjective: No issues noted from overnight  Objective: BP 101/61 (BP Location: Right Arm)   Pulse 61   Temp 97.8 F (36.6 C) (Axillary)   Resp 16   Ht 5\' 6"  (1.676 m)   Wt 44.6 kg   SpO2 100%   BMI 15.87 kg/m   Examination:  General exam: Appears calm and comfortable Respiratory system: Respiratory effort normal..    Data Reviewed: I have personally reviewed following labs and imaging studies  CBC Lab Results  Component Value Date   WBC 5.0 12/12/2022   RBC 5.03 12/12/2022   HGB 14.6 12/12/2022   HCT 45.5 12/12/2022   MCV 90.5 12/12/2022   MCH 29.0 12/12/2022   PLT 304 12/12/2022   MCHC 32.1 12/12/2022   RDW 14.5 12/12/2022   LYMPHSABS 1.4 09/04/2022   MONOABS 0.3 09/04/2022   EOSABS 0.1 09/04/2022   BASOSABS  0.0 09/04/2022     Last metabolic panel Lab Results  Component Value Date   NA 138 01/06/2023   K 4.4 01/06/2023   CL 106 01/06/2023   CO2 25 01/06/2023   BUN 17 01/06/2023   CREATININE 1.06 (H) 01/06/2023   GLUCOSE 85 01/06/2023   GFRNONAA >60 01/06/2023   GFRAA 65 09/19/2017   CALCIUM 8.8 (L) 01/06/2023   PHOS 3.5 10/28/2022   PROT 6.2 (L) 12/24/2022   ALBUMIN 3.2 (L)  12/24/2022   LABGLOB 2.8 06/21/2020   AGRATIO 1.5 06/21/2020   BILITOT 1.0 12/24/2022   ALKPHOS 54 12/24/2022   AST 30 12/24/2022   ALT 17 12/24/2022   ANIONGAP 7 01/06/2023    GFR: Estimated Creatinine Clearance: 42.2 mL/min (A) (by C-G formula based on SCr of 1.06 mg/dL (H)).  No results found for this or any previous visit (from the past 240 hour(s)).    Radiology Studies: No results found.    LOS: 385 days    Jacquelin Hawking, MD [facility] 01/15/2023, 2:45 PM   If 7PM-7AM, please contact night-coverage [email or web address]

## 2023-01-15 NOTE — Plan of Care (Signed)

## 2023-01-16 NOTE — Plan of Care (Signed)

## 2023-01-16 NOTE — Progress Notes (Signed)
PROGRESS NOTE    Natalie Brown  WGN:[id number] DOB: Sep 05, 1965 DOA: 12/24/2021 PCP: Lavinia Sharps, NP   Brief Narrative: 57 y.o. 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    initially admitted to [facility] 08/2021 with agitation and aggressive behaviors.  Rx manic Bipolar, but after several months in [facility] noted significant cognitive impairment, and dementia was suspected.  Patient was observed to have significant short term memory impairment and worsening hygiene, MOCA 11/30 and CT head revealed advanced cerebral atrophy.     September 2023, patient was transferred for further evaluation and treatment due to concerns of new metabolic encephalopathy (that appear to have developed acutely over ~1 day).  Neurology and Psychiatry were consulted.  EEG was unremarkable.  Ammonia, TSH and RPR were normal.  MRI brain revealed no acute findings, but confirmed advanced atrophy.  Neurology suspected a particularly aggressive premature dementia.     Initially forgetful but redirectable and oriented, she is now abulic and essentially nonverbal (nursing note occasional staff or some family members can elicit more responses, but they are very limited).  Cannot bathe or toilet independently.  Patient holds food in the mouth, recent calorie count (7/26-7/29) was observed to eat 50% of meals or less.   Nursing report she does not swallow her pills regularly due to pocketing of pills or holding pills in the mouth for long periods.  In the last 6 months, she has had 22 kg weight loss and has become noticeably weaker and more prone to falls when attempting to walk (which she cannot do safely without assistance).   Palliative have been consulted and have met with family. Family are aware her dementia and weight loss are terminal.    Assessment and Plan:  Early onset dementia Noted.  Palliative care consulted patient has  been transitioned to comfort measures.  Bipolar disorder Psychiatry consult this admission.  Olanzapine dosing adjusted per psychiatry recommendations.  Patient is also on lorazepam scheduled in addition to as needed.  Severe protein calorie malnutrition Severe weight loss since admission from September 2023.  Vitamin B12 deficiency -Continue vitamin B12 supplementation  CKD stage IIIa Stable.  Primary hypertension No longer on medical therapy.   DVT prophylaxis: Lovenox Code Status:   Code Status: Do not attempt resuscitation (DNR) - Comfort care Family Communication: None at bedside. Called mother via telephone (9/26) with no response Disposition Plan: Discharge to nursing home when bed is available   Consultants:    Procedures:    Antimicrobials:     Subjective: No issues noted.  Objective: BP 94/65 (BP Location: Left Arm)   Pulse 81   Temp 98.6 F (37 C) (Oral)   Resp 18   Ht 5\' 6"  (1.676 m)   Wt 44.6 kg   SpO2 100%   BMI 15.87 kg/m   Examination: General exam: Appears calm and comfortable    Data Reviewed: I have personally reviewed following labs and imaging studies  CBC Lab Results  Component Value Date   WBC 5.0 12/12/2022   RBC 5.03 12/12/2022   HGB 14.6 12/12/2022   HCT 45.5 12/12/2022   MCV 90.5 12/12/2022   MCH 29.0 12/12/2022   PLT 304 12/12/2022   MCHC 32.1 12/12/2022   RDW 14.5 12/12/2022   LYMPHSABS 1.4 09/04/2022   MONOABS 0.3 09/04/2022   EOSABS 0.1 09/04/2022   BASOSABS 0.0 09/04/2022     Last metabolic panel Lab  Results  Component Value Date   NA 138 01/06/2023   K 4.4 01/06/2023   CL 106 01/06/2023   CO2 25 01/06/2023   BUN 17 01/06/2023   CREATININE 1.06 (H) 01/06/2023   GLUCOSE 85 01/06/2023   GFRNONAA >60 01/06/2023   GFRAA 65 09/19/2017   CALCIUM 8.8 (L) 01/06/2023   PHOS 3.5 10/28/2022   PROT 6.2 (L) 12/24/2022   ALBUMIN 3.2 (L) 12/24/2022   LABGLOB 2.8 06/21/2020   AGRATIO 1.5 06/21/2020   BILITOT  1.0 12/24/2022   ALKPHOS 54 12/24/2022   AST 30 12/24/2022   ALT 17 12/24/2022   ANIONGAP 7 01/06/2023    GFR: Estimated Creatinine Clearance: 42.2 mL/min (A) (by C-G formula based on SCr of 1.06 mg/dL (H)).  No results found for this or any previous visit (from the past 240 hour(s)).    Radiology Studies: No results found.    LOS: 386 days    Jacquelin Hawking, MD [facility] 01/16/2023, 8:16 AM   If 7PM-7AM, please contact night-coverage [email or web address]

## 2023-01-17 MED ORDER — DIPHENHYDRAMINE HCL 50 MG/ML IJ SOLN: 25.0000 mg | Freq: Once | INTRAMUSCULAR | Status: DC

## 2023-01-17 MED ORDER — OLANZAPINE 5 MG PO TBDP
5.0000 mg | ORAL_TABLET | Freq: Every day | ORAL | Status: DC
  Administered 2023-01-17: 5 mg via ORAL
  Filled 2023-01-17: qty 1

## 2023-01-17 NOTE — TOC Progression Note (Signed)
Transition of Care [facility]) - Progression Note    Patient Details  Name: Natalie Brown MRN: [id number] Date of Birth: [birth year]  [facility] of [facility]) CM/SW Contact  Inis Sizer, LCSW Phone Number: 01/17/2023, 1:12 PM  Clinical Narrative:    CSW spoke with [place] at [employer] who states she is willing to review patient's referral again to determine if a bed offer can be made. Clinical information is currently under review.     Barriers to Discharge: Financial Resources, Inadequate or no insurance, Homeless with medical needs  Expected Discharge Plan and Services In-house Referral: Clinical Social Work   Post Acute Care Choice: Nursing Home Living arrangements for the past 2 months: Homeless [facility]) Expected Discharge Date: 12/24/21                                     Social Determinants of Health (SDOH) Interventions SDOH Screenings   Food Insecurity: No Food Insecurity (03/05/2022)  Housing: High Risk (03/21/2022)  Alcohol Screen: Low Risk  (09/06/2021)  Depression (PHQ2-9): Medium Risk (05/26/2020)  Tobacco Use: High Risk (03/21/2022)    Readmission Risk Interventions     No data to display

## 2023-01-17 NOTE — Progress Notes (Addendum)
PROGRESS NOTE    Natalie Brown  QMV:[id number] DOB: May 09, 1965 DOA: 12/24/2021 PCP: Lavinia Sharps, NP   Brief Narrative: 57 y.o. 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    initially admitted to [facility] 08/2021 with agitation and aggressive behaviors.  Rx manic Bipolar, but after several months in [facility] noted significant cognitive impairment, and dementia was suspected.  Patient was observed to have significant short term memory impairment and worsening hygiene, MOCA 11/30 and CT head revealed advanced cerebral atrophy.     September 2023, patient was transferred for further evaluation and treatment due to concerns of new metabolic encephalopathy (that appear to have developed acutely over ~1 day).  Neurology and Psychiatry were consulted.  EEG was unremarkable.  Ammonia, TSH and RPR were normal.  MRI brain revealed no acute findings, but confirmed advanced atrophy.  Neurology suspected a particularly aggressive premature dementia.     Initially forgetful but redirectable and oriented, she is now abulic and essentially nonverbal (nursing note occasional staff or some family members can elicit more responses, but they are very limited).  Cannot bathe or toilet independently.  Patient holds food in the mouth, recent calorie count (7/26-7/29) was observed to eat 50% of meals or less.   Nursing report she does not swallow her pills regularly due to pocketing of pills or holding pills in the mouth for long periods.  In the last 6 months, she has had 22 kg weight loss and has become noticeably weaker and more prone to falls when attempting to walk (which she cannot do safely without assistance).   Palliative have been consulted and have met with family. Family are aware her dementia and weight loss are terminal.    Assessment and Plan:  Early onset dementia Noted.  Palliative care consulted patient has  been transitioned to comfort measures.  Bipolar disorder Psychiatry consult this admission.  Olanzapine dosing adjusted per psychiatry recommendations.  Patient is also on lorazepam scheduled in addition to as needed.  Severe protein calorie malnutrition Severe weight loss since admission from September 2023.  Vitamin B12 deficiency -Continue vitamin B12 supplementation  CKD stage IIIa Stable.  Primary hypertension No longer on medical therapy.  Jerking motions Unclear etiology. Have not seen these personally. It is possible these could be related to antipsychotic therapy and development of dystonic reactions. May need to adjust psychiatric medication dosing. -Decrease to Zyprexa 5 mg daily -Benadryl 25 mg IV x1 -Psychiatry follow-up in AM   DVT prophylaxis: Lovenox Code Status:   Code Status: Do not attempt resuscitation (DNR) - Comfort care Family Communication: None at bedside. Disposition Plan: Discharge to nursing home when bed is available   Consultants:    Procedures:    Antimicrobials:     Subjective: No events noted from overnight. Nursing states patient intermittently has a tremor/jerking motion when moving at times.  Objective: BP (!) 131/47 (BP Location: Right Arm)   Pulse 99   Temp 98.3 F (36.8 C)   Resp 19   Ht 5\' 6"  (1.676 m)   Wt 44.6 kg   SpO2 90%   BMI 15.87 kg/m   Examination: General exam: Appears calm and comfortable    Data Reviewed: I have personally reviewed following labs and imaging studies  CBC Lab Results  Component Value Date   WBC 5.0 12/12/2022   RBC 5.03 12/12/2022   HGB 14.6 12/12/2022   HCT 45.5 12/12/2022  MCV 90.5 12/12/2022   MCH 29.0 12/12/2022   PLT 304 12/12/2022   MCHC 32.1 12/12/2022   RDW 14.5 12/12/2022   LYMPHSABS 1.4 09/04/2022   MONOABS 0.3 09/04/2022   EOSABS 0.1 09/04/2022   BASOSABS 0.0 09/04/2022     Last metabolic panel Lab Results  Component Value Date   NA 138 01/06/2023   K 4.4  01/06/2023   CL 106 01/06/2023   CO2 25 01/06/2023   BUN 17 01/06/2023   CREATININE 1.06 (H) 01/06/2023   GLUCOSE 85 01/06/2023   GFRNONAA >60 01/06/2023   GFRAA 65 09/19/2017   CALCIUM 8.8 (L) 01/06/2023   PHOS 3.5 10/28/2022   PROT 6.2 (L) 12/24/2022   ALBUMIN 3.2 (L) 12/24/2022   LABGLOB 2.8 06/21/2020   AGRATIO 1.5 06/21/2020   BILITOT 1.0 12/24/2022   ALKPHOS 54 12/24/2022   AST 30 12/24/2022   ALT 17 12/24/2022   ANIONGAP 7 01/06/2023    GFR: Estimated Creatinine Clearance: 42.2 mL/min (A) (by C-G formula based on SCr of 1.06 mg/dL (H)).  No results found for this or any previous visit (from the past 240 hour(s)).    Radiology Studies: No results found.    LOS: 387 days    Jacquelin Hawking, MD [facility] 01/17/2023, 4:55 PM   If 7PM-7AM, please contact night-coverage [email or web address]

## 2023-01-18 DIAGNOSIS — F319 Bipolar disorder, unspecified: Secondary | ICD-10-CM

## 2023-01-18 MED ORDER — OLANZAPINE 5 MG PO TBDP: 2.5000 mg | ORAL_TABLET | Freq: Every day | ORAL | Status: DC

## 2023-01-18 MED ORDER — OLANZAPINE 5 MG PO TBDP
5.0000 mg | ORAL_TABLET | Freq: Every day | ORAL | Status: DC
  Administered 2023-01-18 – 2023-01-19 (×2): 5 mg via ORAL
  Filled 2023-01-18 (×2): qty 1

## 2023-01-18 MED ORDER — DIPHENHYDRAMINE HCL 50 MG/ML IJ SOLN
25.0000 mg | Freq: Once | INTRAMUSCULAR | Status: AC
  Administered 2023-01-18: 25 mg via INTRAMUSCULAR
  Filled 2023-01-18: qty 1

## 2023-01-18 NOTE — Consult Note (Addendum)
[facility] Face-to-Face Psychiatry Consult   Reason for Consult:  dystonia Referring Physician:  Nettey   Patient Identification: Natalie Brown MRN:  [id number] Principal Diagnosis: Dementia with behavioral disturbance (HCC) Diagnosis:  Principal Problem:   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) Active Problems:   Bipolar affective disorder, current episode manic with psychotic symptoms (HCC)   Tobacco abuse   Delirium due to multiple etiologies, acute, hyperactive   Sinus tachycardia   Altered mental state   B12 deficiency   COVID-19 virus infection   Drooling   Essential hypertension   Protein-calorie malnutrition, severe   Total Time spent with patient: 15 minutes  Subjective:   Natalie Brown is a 57 y.o. female admitted medically for 12/24/2021 11:34 PM for possible catatonia/altered mental status. She carries the psychiatric diagnoses of bipolar affective disorder with psychotic symptoms and has a past medical history of hypertension.   HPI:  pt not speaking, provides no subjective information. Per nurse, whole body jerking motion in fetal position started a few days ago. She reports that since haldol was stopped, pt is more awake, eating more, talking at times, able to type her name on the computer recently.   Past Psychiatric History: per chart review -  Bipolar affective disorder with psychotic features, MNCD Admitted to [facility] since Sep 06, 2021 Family psych history: Patient reports mental illness runs in her father's side of the family.  Risk to Self:  not directly, could accidentally harm self due to needing redirection Risk to Others:  no    Past Medical History:  Past Medical History:  Diagnosis Date   Bipolar 1 disorder with moderate mania (HCC) 12/30/2013   Hypertension    History reviewed. No pertinent surgical history. Family History: History reviewed. No pertinent family history. Family Psychiatric  History: per records - none pertinent  Social  History:  Social History   Substance and Sexual Activity  Alcohol Use No     Social History   Substance and Sexual Activity  Drug Use No    Social History   Socioeconomic History   Marital status: Married    Spouse name: Not on file   Number of children: Not on file   Years of education: Not on file   Highest education level: Not on file  Occupational History   Not on file  Tobacco Use   Smoking status: Every Day   Smokeless tobacco: Current  Substance and Sexual Activity   Alcohol use: No   Drug use: No   Sexual activity: Not on file  Other Topics Concern   Not on file  Social History Narrative   Not on file   Social Determinants of Health   Financial Resource Strain: Not on file  Food Insecurity: No Food Insecurity (03/05/2022)   Hunger Vital Sign    Worried About Running Out of Food in the Last Year: Never true    Ran Out of Food in the Last Year: Never true  Transportation Needs: Not on file  Physical Activity: Not on file  Stress: Not on file  Social Connections: Not on file   Additional Social History:    Allergies:   Allergies  Allergen Reactions   Penicillins Itching    Labs: No results found for this or any previous visit (from the past 48 hour(s)).  Current Facility-Administered Medications  Medication Dose Route Frequency Provider Last Rate Last Admin   (feeding supplement) PROSource Plus liquid 30 mL  30 mL Oral BID BM Danford,  Earl Lites, MD   30 mL at 01/17/23 1735   acetaminophen (TYLENOL) tablet 650 mg  650 mg Oral Q6H PRN Orland Mustard, MD   650 mg at 12/17/22 2023   alum & mag hydroxide-simeth (MAALOX/MYLANTA) 200-200-20 MG/5ML suspension 30 mL  30 mL Oral Q4H PRN Orland Mustard, MD       bisacodyl (DULCOLAX) suppository 10 mg  10 mg Rectal Daily PRN Kc, Dayna Barker, MD       cyanocobalamin (VITAMIN B12) tablet 500 mcg  500 mcg Oral Daily Hazeline Junker B, MD   500 mcg at 01/18/23 8657   diphenhydrAMINE (BENADRYL) injection 25 mg  25 mg  Intravenous Once Narda Bonds, MD       enoxaparin (LOVENOX) injection 40 mg  40 mg Subcutaneous Daily Vann, Jessica U, DO   40 mg at 01/17/23 0824   feeding supplement (ENSURE ENLIVE / ENSURE PLUS) liquid 237 mL  237 mL Oral BID BM Danford, Earl Lites, MD   237 mL at 01/18/23 0905   fluticasone (FLONASE) 50 MCG/ACT nasal spray 1 spray  1 spray Each Nare Daily PRN Orland Mustard, MD       hydrocerin (EUCERIN) cream   Topical BID Orland Mustard, MD   Given at 01/18/23 0905   ipratropium-albuterol (DUONEB) 0.5-2.5 (3) MG/3ML nebulizer solution 3 mL  3 mL Nebulization Q4H PRN Amin, Ankit C, MD       lactulose (CHRONULAC) 10 GM/15ML solution 20 g  20 g Oral BID PRN Almon Hercules, MD       LORazepam (ATIVAN) 2 MG/ML concentrated solution 1 mg  1 mg Oral TID Mariel Craft, MD   1 mg at 01/18/23 0859   LORazepam (ATIVAN) tablet 1 mg  1 mg Oral Q6H PRN Hillary Bow, DO   1 mg at 01/14/23 1111   multivitamin with minerals tablet 1 tablet  1 tablet Oral Daily Amin, Ankit C, MD   1 tablet at 01/18/23 0905   OLANZapine zydis (ZYPREXA) disintegrating tablet 2.5 mg  2.5 mg Oral QHS Alexzia Kasler, Harrold Donath, MD       ondansetron [facility]) tablet 4 mg  4 mg Oral Q8H PRN Orland Mustard, MD   4 mg at 03/17/22 0139   Oral care mouth rinse  15 mL Mouth Rinse PRN Leroy Sea, MD       polyethylene glycol (MIRALAX / GLYCOLAX) packet 17 g  17 g Oral Daily Pokhrel, Laxman, MD   17 g at 01/17/23 0824   Tdap (BOOSTRIX) injection 0.5 mL  0.5 mL Intramuscular Once Joseph Art, DO        Musculoskeletal: Strength & Muscle Tone: attempted a physical examination, but pt was not comfortable. [name] able to stand but is in fetal position for most of the interview, rocking self back and forth  Gait & Station: is able to stand Patient leans: did not assess           Psychiatric Specialty Exam:  Presentation  General Appearance:  -- (jerking motion, in fetal position, appropriately clothed and  covered)  Eye Contact: Fleeting  Speech: Blocked  Speech Volume: Other (comment)  Handedness: Right   Mood and Affect  Mood: -- (pt not speaking)  Affect: Restricted   Thought Process  Thought Processes: -- (pt not speaking)  Descriptions of Associations:-- (pt not speaking)  Orientation:-- (pt not speaking)  Thought Content:-- (pt not speaking)  History of Schizophrenia/Schizoaffective disorder:Yes  Duration of Psychotic Symptoms:Greater than six months  Hallucinations:Hallucinations: -- (pt not speaking)  Ideas of Reference:-- (pt not speaking)  Suicidal Thoughts:Suicidal Thoughts: -- (pt not speaking)  Homicidal Thoughts:Homicidal Thoughts: -- (pt not speaking)   Sensorium  Memory: -- (pt not speaking)  Judgment: Impaired  Insight: Lacking   Executive Functions  Concentration: Poor  Attention Span: Poor  Recall: Poor  Fund of Knowledge: Poor  Language: Poor   Psychomotor Activity  Psychomotor Activity: Psychomotor Activity: -- (restless, jerking motion in fetal position, refuses physical exam)   Assets  Assets: Resilience   Sleep  Sleep:No data recorded  Physical Exam: Physical Exam Vitals reviewed.  Pulmonary:     Effort: Pulmonary effort is normal.    Review of Systems  Psychiatric/Behavioral:  Positive for memory loss.   All other systems reviewed and are negative.  Blood pressure 91/67, pulse 80, temperature 97.8 F (36.6 C), temperature source Oral, resp. rate 18, height 5\' 6"  (1.676 m), weight 44.6 kg, SpO2 100%. Body mass index is 15.87 kg/m.  Treatment Plan Summary:  Assessment/Plan:  Bipolar disorder - on zyprexa  Early onset dementia with behavioral disturbance   R/o EPS as adverse effect to antipsychotic medication Psychiatry was consulted for dystonia, and I attempted a physical exam, which was limited by pt not cooperating. Pt was moving all 4 extremities at separate times, did stand. Did not  appreciate any paraspinal dystonia.  It is difficult to rule in or rule out dystonia.  Recommend trial of IM benadryl 25 mg or 50 mg and observe if the symptoms resolve.   Could be some akathisia, rocking motion - pt is already on scheduled ativan. No obvious pseudoparkinson symptoms.  It is not clear if symptoms are 2/2 psych meds (most concerning would be zyprexa which has a lower rate of dystonia and other eps).   Recommend decreasing zyprexa to 2.5 mg at bedtime as a trial to see if this affects the concerning movements, without causing psychiatric decompensation.   There could be some utility in consulting neurology, if these movements are not due to psychiatric medication side effects.   4. R/o Catatonia - pt already on ativan    Psychiatry will follow.   Disposition: No evidence of imminent risk to self or others at present.   Patient does not meet criteria for psychiatric inpatient admission.   Cristy Hilts, MD 01/18/2023 12:13 PM  Total Time Spent in Direct Patient Care:  I personally spent 40 minutes on the unit in direct patient care. The direct patient care time included face-to-face time with the patient, reviewing the patient's chart, communicating with other professionals, and coordinating care. Greater than 50% of this time was spent in counseling or coordinating care with the patient regarding goals of hospitalization, psycho-education, and discharge planning needs.   Phineas Inches, MD Psychiatrist

## 2023-01-18 NOTE — Progress Notes (Signed)
PROGRESS NOTE    Natalie Brown  NWG:[id number] DOB: [birth year] DOA: 12/24/2021 PCP: Lavinia Sharps, NP   Brief Narrative: 57 y.o. 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    initially admitted to [facility] 08/2021 with agitation and aggressive behaviors.  Rx manic Bipolar, but after several months in [facility] noted significant cognitive impairment, and dementia was suspected.  Patient was observed to have significant short term memory impairment and worsening hygiene, MOCA 11/30 and CT head revealed advanced cerebral atrophy.     September 2023, patient was transferred for further evaluation and treatment due to concerns of new metabolic encephalopathy (that appear to have developed acutely over ~1 day).  Neurology and Psychiatry were consulted.  EEG was unremarkable.  Ammonia, TSH and RPR were normal.  MRI brain revealed no acute findings, but confirmed advanced atrophy.  Neurology suspected a particularly aggressive premature dementia.     Initially forgetful but redirectable and oriented, she is now abulic and essentially nonverbal (nursing note occasional staff or some family members can elicit more responses, but they are very limited).  Cannot bathe or toilet independently.  Patient holds food in the mouth, recent calorie count (7/26-7/29) was observed to eat 50% of meals or less.   Nursing report she does not swallow her pills regularly due to pocketing of pills or holding pills in the mouth for long periods.  In the last 6 months, she has had 22 kg weight loss and has become noticeably weaker and more prone to falls when attempting to walk (which she cannot do safely without assistance).   Palliative have been consulted and have met with family. Family are aware her dementia and weight loss are terminal.    Assessment and Plan:  Early onset dementia Noted.  Palliative care consulted patient has  been transitioned to comfort measures.  Bipolar disorder Psychiatry consult this admission.  Olanzapine dosing adjusted per psychiatry recommendations.  Patient is also on lorazepam scheduled in addition to as needed.  Severe protein calorie malnutrition Severe weight loss since admission from September 2023.  Vitamin B12 deficiency -Continue vitamin B12 supplementation  CKD stage IIIa Stable.  Primary hypertension No longer on medical therapy.  Jerking motions Unclear etiology. Have not seen these personally. It is possible these could be related to antipsychotic therapy and development of dystonia. Psychiatry consulted; unclear if dystonia but agree with trail of benadryl and decrease to Zyprexa 5 mg (confirmed with psych) -Decrease to Zyprexa 5 mg daily -Benadryl 25 mg IM x1 -Psychiatry plans to follow-up in AM   DVT prophylaxis: Lovenox Code Status:   Code Status: Do not attempt resuscitation (DNR) - Comfort care Family Communication: None at bedside. Disposition Plan: Discharge to nursing home when bed is available   Consultants:    Procedures:    Antimicrobials:     Subjective: Patient is non-verbal.  Objective: BP 91/67 (BP Location: Right Arm)   Pulse 80   Temp 97.8 F (36.6 C) (Oral)   Resp 18   Ht 5\' 6"  (1.676 m)   Wt 44.6 kg   SpO2 100%   BMI 15.87 kg/m   Examination: General exam: Appears calm and comfortable Respiratory system: Patient prevented me from examining her. Cardiovascular system: Patient prevented me from examining her. Gastrointestinal system: Patient prevented me from examining her. Central nervous system: Alert. Jerking of bilateral LE with movement; jerking movements appear to subside at rest  Data Reviewed: I have personally reviewed following labs and imaging studies  CBC Lab Results  Component Value Date   WBC 5.0 12/12/2022   RBC 5.03 12/12/2022   HGB 14.6 12/12/2022   HCT 45.5 12/12/2022   MCV 90.5 12/12/2022    MCH 29.0 12/12/2022   PLT 304 12/12/2022   MCHC 32.1 12/12/2022   RDW 14.5 12/12/2022   LYMPHSABS 1.4 09/04/2022   MONOABS 0.3 09/04/2022   EOSABS 0.1 09/04/2022   BASOSABS 0.0 09/04/2022     Last metabolic panel Lab Results  Component Value Date   NA 138 01/06/2023   K 4.4 01/06/2023   CL 106 01/06/2023   CO2 25 01/06/2023   BUN 17 01/06/2023   CREATININE 1.06 (H) 01/06/2023   GLUCOSE 85 01/06/2023   GFRNONAA >60 01/06/2023   GFRAA 65 09/19/2017   CALCIUM 8.8 (L) 01/06/2023   PHOS 3.5 10/28/2022   PROT 6.2 (L) 12/24/2022   ALBUMIN 3.2 (L) 12/24/2022   LABGLOB 2.8 06/21/2020   AGRATIO 1.5 06/21/2020   BILITOT 1.0 12/24/2022   ALKPHOS 54 12/24/2022   AST 30 12/24/2022   ALT 17 12/24/2022   ANIONGAP 7 01/06/2023    GFR: Estimated Creatinine Clearance: 42.2 mL/min (A) (by C-G formula based on SCr of 1.06 mg/dL (H)).  No results found for this or any previous visit (from the past 240 hour(s)).    Radiology Studies: No results found.    LOS: 388 days    Jacquelin Hawking, MD [facility] 01/18/2023, 1:13 PM   If 7PM-7AM, please contact night-coverage [email or web address]

## 2023-01-18 NOTE — Plan of Care (Signed)

## 2023-01-19 NOTE — Consult Note (Signed)
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 female admitted medically for 12/24/2021 11:34 PM for possible catatonia/altered mental status. She carries the psychiatric diagnoses of bipolar affective disorder with psychotic symptoms and has a past medical history of hypertension.   HPI: During today's evaluation, the patient was interviewed, with her mother at bedside.  The patient is not speaking and does not provide any subjective information.  [name] eating her lunch, is able to eat without any significant difficulty, or without bits of food following off her fork.  No obvious tremor observed.  Patient again does not allow for any physical examination.  It is noted that her jerking motions to reduce and sometimes completely subside while she is eating. Mother reports that the jerking motions got more significant about 5 or so days ago.  We discussed that we are reducing Zyprexa, if this is contributing to the patient's jerking motions.  Mother is agreeable to this plan.  Mother otherwise states that the patient slept well over the night and there have not been any other behavioral or psychiatric concerns.  Per nurse, no change in movements with administration of IV benadryl yesterday.     Past Psychiatric  History: per chart review -  Bipolar affective disorder with psychotic features, MNCD Admitted to [facility] since Sep 06, 2021 Family psych history: Patient reports mental illness runs in her father's side of the family.  Risk to Self:  not directly, could accidentally harm self due to needing redirection Risk to Others:  no    Past Medical History:  Past Medical History:  Diagnosis Date   Bipolar 1 disorder with moderate mania (HCC) 12/30/2013   Hypertension    History reviewed. No pertinent surgical history. Family History: History reviewed. No pertinent family history. Family Psychiatric  History: per records - none pertinent  Social History:  Social History   Substance and Sexual Activity  Alcohol Use No     Social History   Substance and Sexual Activity  Drug Use No    Social History   Socioeconomic History   Marital status: Married    Spouse name: Not on file   Number of children: Not on file   Years of education: Not on file   Highest education level: Not on file  Occupational History   Not on file  Tobacco Use   Smoking status: Every Day   Smokeless tobacco: Current  Substance and Sexual Activity   Alcohol use: No   Drug use: No   Sexual activity: Not on file  Other Topics Concern   Not on file  Social History Narrative   Not on file   Social Determinants of Health   Financial Resource Strain: Not on file  Food Insecurity: No Food Insecurity (03/05/2022)   Hunger Vital Sign    Worried About Running Out  of Food in the Last Year: Never true    Ran Out of Food in the Last Year: Never true  Transportation Needs: Not on file  Physical Activity: Not on file  Stress: Not on file  Social Connections: Not on file   Additional Social History:    Allergies:   Allergies  Allergen Reactions   Penicillins Itching    Labs: No results found for this or any previous visit (from the past 48 hour(s)).  Current Facility-Administered Medications  Medication Dose  Route Frequency Provider Last Rate Last Admin   (feeding supplement) PROSource Plus liquid 30 mL  30 mL Oral BID BM Danford, Earl Lites, MD   30 mL at 01/19/23 0920   acetaminophen (TYLENOL) tablet 650 mg  650 mg Oral Q6H PRN Orland Mustard, MD   650 mg at 12/17/22 2023   alum & mag hydroxide-simeth (MAALOX/MYLANTA) 200-200-20 MG/5ML suspension 30 mL  30 mL Oral Q4H PRN Orland Mustard, MD       bisacodyl (DULCOLAX) suppository 10 mg  10 mg Rectal Daily PRN Kc, Dayna Barker, MD       cyanocobalamin (VITAMIN B12) tablet 500 mcg  500 mcg Oral Daily Hazeline Junker B, MD   500 mcg at 01/19/23 0920   enoxaparin (LOVENOX) injection 40 mg  40 mg Subcutaneous Daily Vann, Jessica U, DO   40 mg at 01/19/23 0920   feeding supplement (ENSURE ENLIVE / ENSURE PLUS) liquid 237 mL  237 mL Oral BID BM Danford, Earl Lites, MD   237 mL at 01/19/23 0921   fluticasone (FLONASE) 50 MCG/ACT nasal spray 1 spray  1 spray Each Nare Daily PRN Orland Mustard, MD       hydrocerin (EUCERIN) cream   Topical BID Orland Mustard, MD   Given at 01/19/23 [phone]   ipratropium-albuterol (DUONEB) 0.5-2.5 (3) MG/3ML nebulizer solution 3 mL  3 mL Nebulization Q4H PRN Amin, Ankit C, MD       lactulose (CHRONULAC) 10 GM/15ML solution 20 g  20 g Oral BID PRN Almon Hercules, MD       LORazepam (ATIVAN) 2 MG/ML concentrated solution 1 mg  1 mg Oral TID Mariel Craft, MD   1 mg at 01/19/23 0920   LORazepam (ATIVAN) tablet 1 mg  1 mg Oral Q6H PRN Hillary Bow, DO   1 mg at 01/18/23 2004   multivitamin with minerals tablet 1 tablet  1 tablet Oral Daily Amin, Ankit C, MD   1 tablet at 01/19/23 0920   OLANZapine zydis (ZYPREXA) disintegrating tablet 5 mg  5 mg Oral QHS Narda Bonds, MD   5 mg at 01/18/23 2144   ondansetron (ZOFRAN) tablet 4 mg  4 mg Oral Q8H PRN Orland Mustard, MD   4 mg at 03/17/22 0139   Oral care mouth rinse  15 mL Mouth Rinse PRN Leroy Sea, MD       polyethylene glycol (MIRALAX / GLYCOLAX) packet 17 g  17 g Oral  Daily Pokhrel, Laxman, MD   17 g at 01/19/23 0920   Tdap (BOOSTRIX) injection 0.5 mL  0.5 mL Intramuscular Once Joseph Art, DO        Musculoskeletal: Strength & Muscle Tone: attempted a physical examination, but pt was not comfortable. [name] able to stand but is in fetal position for most of the interview, rocking self back and forth  Gait & Station: is able to stand Patient leans: did not assess  Psychiatric Specialty Exam:  Presentation  General Appearance:  -- (jerking motion, in fetal position, appropriately clothed and covered)  Eye Contact: Fleeting  Speech: Blocked  Speech Volume: Other (comment)  Handedness: Right   Mood and Affect  Mood: -- (pt not speaking)  Affect: Restricted   Thought Process  Thought Processes: -- (pt not speaking)  Descriptions of Associations:-- (pt not speaking)  Orientation:-- (pt not speaking)  Thought Content:-- (pt not speaking)  History of Schizophrenia/Schizoaffective disorder:Yes  Duration of Psychotic Symptoms:Greater than six months  Hallucinations:Hallucinations: -- (pt not speaking)  Ideas of Reference:-- (pt not speaking)  Suicidal Thoughts:Suicidal Thoughts: -- (pt not speaking)  Homicidal Thoughts:Homicidal Thoughts: -- (pt not speaking)   Sensorium  Memory: -- (pt not speaking)  Judgment: Impaired  Insight: Lacking   Executive Functions  Concentration: Poor  Attention Span: Poor  Recall: Poor  Fund of Knowledge: Poor  Language: Poor   Psychomotor Activity  Psychomotor Activity: Psychomotor Activity: -- (restless, jerking motion in fetal position, refuses physical exam)   Assets  Assets: Resilience   Sleep  Sleep:No data recorded  Physical Exam: Physical Exam Vitals reviewed.  Pulmonary:     Effort: Pulmonary effort is normal.    Review of Systems  Psychiatric/Behavioral:  Positive for memory loss.   All other systems reviewed and are  negative.  Blood pressure 93/72, pulse 91, temperature 99.1 F (37.3 C), temperature source Oral, resp. rate 18, height 5\' 6"  (1.676 m), weight 44.6 kg, SpO2 (!) 69%. Body mass index is 15.87 kg/m.  Treatment Plan Summary:  Assessment/Plan:  Bipolar disorder - on zyprexa  Early onset dementia with behavioral disturbance  R/o catatonia  R/o EPS as adverse effect to antipsychotic medication  Considerations for abnormal movements -  -Akathesia - Could be some akathisia, rocking motion - pt is already on scheduled ativan. No obvious pseudoparkinson symptoms. No appearing to present as TD.  It is not clear if symptoms are 2/2 psych meds (most concerning would be zyprexa which has a lower rate of dystonia and other eps).   Recommend continuing the reduced dose of zyprexa at 5 mg at bedtime as a trial to see if this affects the concerning movements, without causing psychiatric decompensation.   There could be some utility in consulting neurology, if these movements are not due to psychiatric medication side effects.   Could also trial increase ativan dose to see if this impacts the movements, if they are 2/2 catatonia or other gaba-mediated neurologic process   Psychiatry will follow.     Disposition: No evidence of imminent risk to self or others at present.   Patient does not meet criteria for psychiatric inpatient admission.   Cristy Hilts, MD 01/19/2023 1:21 PM  Total Time Spent in Direct Patient Care:  I personally spent 40 minutes on the unit in direct patient care. The direct patient care time included face-to-face time with the patient, reviewing the patient's chart, communicating with other professionals, and coordinating care. Greater than 50% of this time was spent in counseling or coordinating care with the patient regarding goals of hospitalization, psycho-education, and discharge planning needs.   Phineas Inches, MD Psychiatrist

## 2023-01-19 NOTE — Progress Notes (Signed)
PROGRESS NOTE    Natalie Brown  ZOX:[id number] DOB: 1965/07/24 DOA: 12/24/2021 PCP: Lavinia Sharps, NP   Brief Narrative: 57 y.o. 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    initially admitted to [facility] 08/2021 with agitation and aggressive behaviors.  Rx manic Bipolar, but after several months in [facility] noted significant cognitive impairment, and dementia was suspected.  Patient was observed to have significant short term memory impairment and worsening hygiene, MOCA 11/30 and CT head revealed advanced cerebral atrophy.     September 2023, patient was transferred for further evaluation and treatment due to concerns of new metabolic encephalopathy (that appear to have developed acutely over ~1 day).  Neurology and Psychiatry were consulted.  EEG was unremarkable.  Ammonia, TSH and RPR were normal.  MRI brain revealed no acute findings, but confirmed advanced atrophy.  Neurology suspected a particularly aggressive premature dementia.     Initially forgetful but redirectable and oriented, she is now abulic and essentially nonverbal (nursing note occasional staff or some family members can elicit more responses, but they are very limited).  Cannot bathe or toilet independently.  Patient holds food in the mouth, recent calorie count (7/26-7/29) was observed to eat 50% of meals or less.   Nursing report she does not swallow her pills regularly due to pocketing of pills or holding pills in the mouth for long periods.  In the last 6 months, she has had 22 kg weight loss and has become noticeably weaker and more prone to falls when attempting to walk (which she cannot do safely without assistance).   Palliative have been consulted and have met with family. Family are aware her dementia and weight loss are terminal.    Assessment and Plan:  Early onset dementia Noted.  Palliative care consulted patient has  been transitioned to comfort measures.  Bipolar disorder Psychiatry consult this admission.  Olanzapine dosing adjusted per psychiatry recommendations.  Patient is also on lorazepam scheduled in addition to as needed.  Severe protein calorie malnutrition Severe weight loss since admission from September 2023.  Vitamin B12 deficiency -Continue vitamin B12 supplementation  CKD stage IIIa Stable.  Primary hypertension No longer on medical therapy.  Jerking motions Unclear etiology. Have not seen these personally. It is possible these could be related to antipsychotic therapy and development of dystonia. Psychiatry consulted; unclear if dystonia but agree with trail of benadryl and decrease to Zyprexa 5 mg (confirmed with psych). Per mother, no significant improvement after Benadryl, however nurse thinks there was some mild improvement. -Continue Zyprexa 5 mg daily -Psychiatry recommendations   DVT prophylaxis: Lovenox Code Status:   Code Status: Do not attempt resuscitation (DNR) - Comfort care Family Communication: Mother at bedside. Disposition Plan: Discharge to nursing home when bed is available   Consultants:    Procedures:    Antimicrobials:     Subjective: Patient is non-verbal.  Objective: BP 93/72 (BP Location: Right Arm)   Pulse 91   Temp 99.1 F (37.3 C) (Oral)   Resp 18   Ht 5\' 6"  (1.676 m)   Wt 44.6 kg   SpO2 (!) 69%   BMI 15.87 kg/m   Examination:  General exam: Appears calm and comfortable    Data Reviewed: I have personally reviewed following labs and imaging studies  CBC Lab Results  Component Value Date   WBC 5.0 12/12/2022   RBC 5.03 12/12/2022   HGB  14.6 12/12/2022   HCT 45.5 12/12/2022   MCV 90.5 12/12/2022   MCH 29.0 12/12/2022   PLT 304 12/12/2022   MCHC 32.1 12/12/2022   RDW 14.5 12/12/2022   LYMPHSABS 1.4 09/04/2022   MONOABS 0.3 09/04/2022   EOSABS 0.1 09/04/2022   BASOSABS 0.0 09/04/2022     Last metabolic  panel Lab Results  Component Value Date   NA 138 01/06/2023   K 4.4 01/06/2023   CL 106 01/06/2023   CO2 25 01/06/2023   BUN 17 01/06/2023   CREATININE 1.06 (H) 01/06/2023   GLUCOSE 85 01/06/2023   GFRNONAA >60 01/06/2023   GFRAA 65 09/19/2017   CALCIUM 8.8 (L) 01/06/2023   PHOS 3.5 10/28/2022   PROT 6.2 (L) 12/24/2022   ALBUMIN 3.2 (L) 12/24/2022   LABGLOB 2.8 06/21/2020   AGRATIO 1.5 06/21/2020   BILITOT 1.0 12/24/2022   ALKPHOS 54 12/24/2022   AST 30 12/24/2022   ALT 17 12/24/2022   ANIONGAP 7 01/06/2023    GFR: Estimated Creatinine Clearance: 42.2 mL/min (A) (by C-G formula based on SCr of 1.06 mg/dL (H)).  No results found for this or any previous visit (from the past 240 hour(s)).    Radiology Studies: No results found.    LOS: 389 days    Jacquelin Hawking, MD [facility] 01/19/2023, 1:25 PM   If 7PM-7AM, please contact night-coverage [email or web address]

## 2023-01-19 NOTE — TOC Progression Note (Signed)
Transition of Care [facility]) - Progression Note    Patient Details  Name: Natalie Brown MRN: [id number] Date of Birth: [birth year]  [facility] CM/SW Contact  Inis Sizer, LCSW Phone Number: 01/19/2023, 9:06 AM  Clinical Narrative:    Patient's clinical information remains under review by admissions staff at [facility].     Barriers to Discharge: Financial Resources, Inadequate or no insurance, Homeless with medical needs  Expected Discharge Plan and Services In-house Referral: Clinical Social Work   Post Acute Care Choice: Nursing Home Living arrangements for the past 2 months: Homeless [facility]) Expected Discharge Date: 12/24/21                                     Social Determinants of Health (SDOH) Interventions SDOH Screenings   Food Insecurity: No Food Insecurity (03/05/2022)  Housing: High Risk (03/21/2022)  Alcohol Screen: Low Risk  (09/06/2021)  Depression (PHQ2-9): Medium Risk (05/26/2020)  Tobacco Use: High Risk (03/21/2022)    Readmission Risk Interventions     No data to display

## 2023-01-20 LAB — COMPREHENSIVE METABOLIC PANEL
ALT: 15 U/L (ref 0–44)
AST: 19 U/L (ref 15–41)
Albumin: 2.8 g/dL — ABNORMAL LOW (ref 3.5–5.0)
Alkaline Phosphatase: 53 U/L (ref 38–126)
Anion gap: 10 (ref 5–15)
BUN: 17 mg/dL (ref 6–20)
CO2: 24 mmol/L (ref 22–32)
Calcium: 8.6 mg/dL — ABNORMAL LOW (ref 8.9–10.3)
Chloride: 106 mmol/L (ref 98–111)
Creatinine, Ser: 1.1 mg/dL — ABNORMAL HIGH (ref 0.44–1.00)
GFR, Estimated: 59 mL/min — ABNORMAL LOW (ref >60)
Glucose, Bld: 104 mg/dL — ABNORMAL HIGH (ref 70–99)
Potassium: 3.8 mmol/L (ref 3.5–5.1)
Sodium: 140 mmol/L (ref 135–145)
Total Bilirubin: 0.5 mg/dL (ref 0.3–1.2)
Total Protein: 6 g/dL — ABNORMAL LOW (ref 6.5–8.1)

## 2023-01-20 LAB — MAGNESIUM: Magnesium: 2.1 mg/dL (ref 1.7–2.4)

## 2023-01-20 MED ORDER — OLANZAPINE 5 MG PO TBDP
2.5000 mg | ORAL_TABLET | Freq: Every day | ORAL | Status: DC
  Administered 2023-01-20 – 2023-01-21 (×2): 2.5 mg via ORAL
  Filled 2023-01-20 (×2): qty 1

## 2023-01-20 NOTE — NC FL2 (Addendum)
[place] MEDICAID FL2 LEVEL OF CARE FORM     IDENTIFICATION  Patient Name: Natalie Brown Birthdate: [birth year] Sex: female Admission Date (Current Location): 12/24/2021  [place] and [place] Number:  Haynes Bast [id number] [facility] Facility and Address:  [facility], [address] [place], [place] [address]      Provider Number: [id number]  Attending Physician Name and Address:  Narda Bonds, MD  Relative Name and Phone Number:       Current Level of Care: Hospital Recommended Level of Care: Assisted Living Facility Prior Approval Number:    Date Approved/Denied:   PASRR Number:    Discharge Plan: Other (Comment) (Assisted Living Facility)    Current Diagnoses: Patient Active Problem List   Diagnosis Date Noted   Protein-calorie malnutrition, severe 11/16/2022   B12 deficiency 02/19/2022   COVID-19 virus infection 02/19/2022   Drooling 02/19/2022   Essential hypertension 02/19/2022   Altered mental state 12/26/2021   Sinus tachycardia 12/25/2021   Tobacco abuse 12/24/2021   Delirium due to multiple etiologies, acute, hyperactive 12/24/2021   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) 11/11/2021   Bipolar affective disorder, current episode manic with psychotic symptoms (HCC) 09/12/2021   Dental caries 06/29/2015   Cervical cancer screening 02/22/2014   Current smoker 02/22/2014   Urge incontinence of urine 12/30/2013   Need for prophylactic vaccination and inoculation against influenza 12/30/2013   Bipolar 1 disorder (HCC) 12/30/2013    Orientation RESPIRATION BLADDER Height & Weight      (Unable to assess, often nonverbal)  Normal Incontinent Weight: 98 lb 5.2 oz (44.6 kg) Height:  5\' 6"  (167.6 cm)  BEHAVIORAL SYMPTOMS/MOOD NEUROLOGICAL BOWEL NUTRITION STATUS  Other (Comment), Wanderer (Dementia, no behaviors or aggression)   Incontinent Diet (Regular Diet)  AMBULATORY STATUS COMMUNICATION OF NEEDS Skin   Supervision  Verbally (Patient interacts verbally at times, but also does not engage verbally at times) Normal                       Personal Care Assistance Level of Assistance  Bathing, Feeding, Dressing Bathing Assistance: Maximum assistance Feeding assistance: Maximum assistance Dressing Assistance: Maximum assistance     Functional Limitations Info  Sight, Speech, Hearing Sight Info: Adequate Hearing Info: Adequate Speech Info: Impaired    SPECIAL CARE FACTORS FREQUENCY  PT (By licensed PT), OT (By licensed OT)     PT Frequency: 3x weekly OT Frequency: 3x weekly            Contractures Contractures Info: Not present    Additional Factors Info  Code Status, Allergies, Psychotropic Code Status Info: DNR Allergies Info: Penicillins Psychotropic Info: Ativan         Current Medications (01/20/2023):  This is the current hospital active medication list Current Facility-Administered Medications  Medication Dose Route Frequency Provider Last Rate Last Admin   (feeding supplement) PROSource Plus liquid 30 mL  30 mL Oral BID BM Danford, Earl Lites, MD   30 mL at 01/20/23 1034   acetaminophen (TYLENOL) tablet 650 mg  650 mg Oral Q6H PRN Orland Mustard, MD   650 mg at 12/17/22 2023   alum & mag hydroxide-simeth (MAALOX/MYLANTA) 200-200-20 MG/5ML suspension 30 mL  30 mL Oral Q4H PRN Orland Mustard, MD       bisacodyl (DULCOLAX) suppository 10 mg  10 mg Rectal Daily PRN Kc, Dayna Barker, MD       cyanocobalamin (VITAMIN B12) tablet 500 mcg  500 mcg Oral Daily  Tyrone Nine, MD   500 mcg at 01/20/23 1034   enoxaparin (LOVENOX) injection 40 mg  40 mg Subcutaneous Daily Marlin Canary U, DO   40 mg at 01/20/23 1034   feeding supplement (ENSURE ENLIVE / ENSURE PLUS) liquid 237 mL  237 mL Oral BID BM Danford, Earl Lites, MD   237 mL at 01/20/23 1035   fluticasone (FLONASE) 50 MCG/ACT nasal spray 1 spray  1 spray Each Nare Daily PRN Orland Mustard, MD       hydrocerin (EUCERIN) cream    Topical BID Orland Mustard, MD   Given at 01/20/23 1035   ipratropium-albuterol (DUONEB) 0.5-2.5 (3) MG/3ML nebulizer solution 3 mL  3 mL Nebulization Q4H PRN Amin, Ankit C, MD       lactulose (CHRONULAC) 10 GM/15ML solution 20 g  20 g Oral BID PRN Almon Hercules, MD       LORazepam (ATIVAN) 2 MG/ML concentrated solution 1 mg  1 mg Oral TID Mariel Craft, MD   1 mg at 01/20/23 1034   LORazepam (ATIVAN) tablet 1 mg  1 mg Oral Q6H PRN Hillary Bow, DO   1 mg at 01/18/23 2004   multivitamin with minerals tablet 1 tablet  1 tablet Oral Daily Amin, Ankit C, MD   1 tablet at 01/20/23 1034   OLANZapine zydis (ZYPREXA) disintegrating tablet 5 mg  5 mg Oral QHS Narda Bonds, MD   5 mg at 01/19/23 2202   ondansetron (ZOFRAN) tablet 4 mg  4 mg Oral Q8H PRN Orland Mustard, MD   4 mg at 03/17/22 0139   Oral care mouth rinse  15 mL Mouth Rinse PRN Leroy Sea, MD       polyethylene glycol (MIRALAX / GLYCOLAX) packet 17 g  17 g Oral Daily Pokhrel, Laxman, MD   17 g at 01/19/23 0920   Tdap (BOOSTRIX) injection 0.5 mL  0.5 mL Intramuscular Once Joseph Art, DO         Discharge Medications: Please see discharge summary for a list of discharge medications.  Relevant Imaging Results:  Relevant Lab Results:   Additional Information SSN: [id number]  Inis Sizer, LCSW

## 2023-01-20 NOTE — TOC Progression Note (Signed)
Transition of Care [facility]) - Progression Note    Patient Details  Name: Natalie Brown MRN: [id number] Date of Birth: December 07, 1965  [facility]) CM/SW Contact  Inis Sizer, LCSW Phone Number: 01/20/2023, 1:41 PM  Clinical Narrative:    CSW received a bed offer from [place] [facility]. The facility can accept patient on Wednesday, 01/22/23.  CSW sent patient's PPD results from 10/23 to [facility] for review.  CSW informed unit AD and MD of information.       Barriers to Discharge: Financial Resources, Inadequate or no insurance, Homeless with medical needs  Expected Discharge Plan and Services In-house Referral: Clinical Social Work   Post Acute Care Choice: Nursing Home Living arrangements for the past 2 months: Homeless [facility]) Expected Discharge Date: 12/24/21                                     Social Determinants of Health (SDOH) Interventions SDOH Screenings   Food Insecurity: No Food Insecurity (03/05/2022)  Housing: High Risk (03/21/2022)  Alcohol Screen: Low Risk  (09/06/2021)  Depression (PHQ2-9): Medium Risk (05/26/2020)  Tobacco Use: High Risk (03/21/2022)    Readmission Risk Interventions     No data to display

## 2023-01-20 NOTE — Progress Notes (Signed)
PROGRESS NOTE    Natalie Brown  BMW:[id number] DOB: [birth year] DOA: 12/24/2021 PCP: Lavinia Sharps, NP   Brief Narrative: 57 y.o. 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    initially admitted to [facility] 08/2021 with agitation and aggressive behaviors.  Rx manic Bipolar, but after several months in [facility] noted significant cognitive impairment, and dementia was suspected.  Patient was observed to have significant short term memory impairment and worsening hygiene, MOCA 11/30 and CT head revealed advanced cerebral atrophy.     September 2023, patient was transferred for further evaluation and treatment due to concerns of new metabolic encephalopathy (that appear to have developed acutely over ~1 day).  Neurology and Psychiatry were consulted.  EEG was unremarkable.  Ammonia, TSH and RPR were normal.  MRI brain revealed no acute findings, but confirmed advanced atrophy.  Neurology suspected a particularly aggressive premature dementia.     Initially forgetful but redirectable and oriented, she is now abulic and essentially nonverbal (nursing note occasional staff or some family members can elicit more responses, but they are very limited).  Cannot bathe or toilet independently.  Patient holds food in the mouth, recent calorie count (7/26-7/29) was observed to eat 50% of meals or less.   Nursing report she does not swallow her pills regularly due to pocketing of pills or holding pills in the mouth for long periods.  In the last 6 months, she has had 22 kg weight loss and has become noticeably weaker and more prone to falls when attempting to walk (which she cannot do safely without assistance).   Palliative have been consulted and have met with family. Family are aware her dementia and weight loss are terminal.    Assessment and Plan:  Early onset dementia Noted.  Palliative care consulted patient has  been transitioned to comfort measures.  Bipolar disorder Psychiatry consult this admission.  Olanzapine dosing adjusted per psychiatry recommendations.  Patient is also on lorazepam scheduled in addition to as needed.  Severe protein calorie malnutrition Severe weight loss since admission from September 2023.  Vitamin B12 deficiency -Continue vitamin B12 supplementation  CKD stage IIIa Stable.  Primary hypertension No longer on medical therapy.  Jerking motions Unclear etiology. Have not seen these personally. It is possible these could be related to antipsychotic therapy and development of dystonia. Psychiatry consulted; unclear if dystonia but agree with trail of benadryl and decrease to Zyprexa 5 mg (confirmed with psych). Per mother, no significant improvement after Benadryl, however nurse thinks there was some mild improvement. Movements unchanged today. -Continue Zyprexa 5 mg daily per psychiatry recommendations; psychiatry continuing to follow -Will consider possible neurology consult in AM if movements continue   DVT prophylaxis: Lovenox Code Status:   Code Status: Do not attempt resuscitation (DNR) - Comfort care Family Communication: None at bedside. Disposition Plan: Discharge to nursing home when bed is available   Consultants:  Neurology Psychiatry  Procedures:  9/5 >> 9/6: EEG  Antimicrobials: None   Subjective: Patient is non-verbal.  Objective: BP 93/67 (BP Location: Right Arm)   Pulse 72   Temp 97.6 F (36.4 C) (Oral)   Resp 18   Ht 5\' 6"  (1.676 m)   Wt 44.6 kg   SpO2 100%   BMI 15.87 kg/m   Examination:  General exam: Appears anxious but comfortable. Avoids me when attempting to evaluate. Respiratory system: Respiratory effort normal. Central nervous system: Alert  and oriented. LE tremor while awake   Data Reviewed: I have personally reviewed following labs and imaging studies  CBC Lab Results  Component Value Date   WBC 5.0  12/12/2022   RBC 5.03 12/12/2022   HGB 14.6 12/12/2022   HCT 45.5 12/12/2022   MCV 90.5 12/12/2022   MCH 29.0 12/12/2022   PLT 304 12/12/2022   MCHC 32.1 12/12/2022   RDW 14.5 12/12/2022   LYMPHSABS 1.4 09/04/2022   MONOABS 0.3 09/04/2022   EOSABS 0.1 09/04/2022   BASOSABS 0.0 09/04/2022     Last metabolic panel Lab Results  Component Value Date   NA 138 01/06/2023   K 4.4 01/06/2023   CL 106 01/06/2023   CO2 25 01/06/2023   BUN 17 01/06/2023   CREATININE 1.06 (H) 01/06/2023   GLUCOSE 85 01/06/2023   GFRNONAA >60 01/06/2023   GFRAA 65 09/19/2017   CALCIUM 8.8 (L) 01/06/2023   PHOS 3.5 10/28/2022   PROT 6.2 (L) 12/24/2022   ALBUMIN 3.2 (L) 12/24/2022   LABGLOB 2.8 06/21/2020   AGRATIO 1.5 06/21/2020   BILITOT 1.0 12/24/2022   ALKPHOS 54 12/24/2022   AST 30 12/24/2022   ALT 17 12/24/2022   ANIONGAP 7 01/06/2023    GFR: Estimated Creatinine Clearance: 42.2 mL/min (A) (by C-G formula based on SCr of 1.06 mg/dL (H)).  No results found for this or any previous visit (from the past 240 hour(s)).    Radiology Studies: No results found.    LOS: 390 days    Jacquelin Hawking, MD [facility] 01/20/2023, 12:50 PM   If 7PM-7AM, please contact night-coverage [email or web address]

## 2023-01-20 NOTE — Consult Note (Signed)
[facility] Psychiatry Followup Face-to-Face Psychiatric Evaluation   Name: Natalie Brown DOB: 1966/01/29 MRN: [id number] Service Date: January 20, 2023 LOS:  LOS: 390 days  Reason for Consult: "Medication assistance-repetitive behaviors in the setting of severe catatonia previously-also somewhat agitated and requiring restraints-please assess and determine "  Referring Provider: Dr. Jacqulyn Brown   Assessment  Natalie Brown is a 57 y.o. female admitted medically for 12/24/2021 11:34 PM for possible catatonia/altered mental status. She carries the psychiatric diagnoses of bipolar affective disorder with psychotic symptoms and has a past medical history of hypertension.  This patient has had an unfortunate, rapid decline over the past several months to a year. She was initially admitted to the behavioral health [facility] after decompensating with hx bipolar I disorder (was sleeping outside in the rain, confused, issues remaining on task) in 08/2021. Her psychosis and mania stabilized after about 4 weeks of admission w/ multiple medication trials (Zyprexa, haldol, and Invega). This admission revealed a significant and rapidly progressive dementia. She had a prolonged admission to behavioral health due to no safe discharge options and was transferred to the [facility] in 12/2021 after 4 months after developing delirium/encephalopathy while admitted to [facility]. She has been admitted to Natalie [facility] since 12/2021 (now almost 1 year) and over that time has become almost totally nonverbal however has not been overtly manic or psychotic during that time span; dementia dx and sx has largely superceded these clinical features. Psychiatry consult team has been involved intermittently in her care over the last year (saw at admission 12/2021 when Natalie [facility] regimen continued, in 06/2022 (tapered off of haloperidol d/t rigidity/dystonia - attempted to transition to paliperidone however mother/guardian did not pick up the phone))  and was re consulted by Dr. Mahala Brown due to repetitive behaviors (rocking back and forth). Her family is mostly concerned that these behaviors are preventing her from eating and will lead to hastened death.  She had a remarkable response to low-dose ativan (began talking, eating, now walking again) and is now significantly improved after dose increase/optimization, although mostly responding to family > [facility] staff (as is expected in pt with advanced dementia). Seemed to regress w/ increase of olanzapine (had been started by primary team) which was taken as high as 10 mg/day before being tapered off.   PLEASE CALL MOTHER PRIOR TO ALL MED CHANGES  Diagnoses:  Active [facility] problems: Principal Problem:   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) Active Problems:   Bipolar affective disorder, current episode manic with psychotic symptoms (HCC)   Tobacco abuse   Delirium due to multiple etiologies, acute, hyperactive   Sinus tachycardia   Altered mental state   B12 deficiency   COVID-19 virus infection   Drooling   Essential hypertension   Protein-calorie malnutrition, severe     Plan  ## Safety and Observation Level:  - Based on my clinical evaluation, I estimate the patient to be at moderate risk of self harm in the current setting of safety unawareness; defer management to primary team (pt in lap belt at time of eval)  ## Medications:  -- decrease olanzapine to 2.5 mg qD -- continue lorazepam liquid formula 1 mg three times daily  For PRNs, would go with low potency antipsychotics (ie olanzapine) given prior poor response to high potency   -- prior propranolol d/c 03/2022, atropine dc 01/2022  ## Medical Decision Making Capacity:  Patient has interim legal guardian, mother Natalie Brown [phone]); clearly lacks capacity for most medical decisions.   ##  Further Work-up:  -- most recent EKG on 11/23/2022  had QtC of 407 in setting of tachycardia  -- Pertinent labwork  reviewed earlier this admission includes:  Labwork from last month reviewed w/ increasing creatinine.  CMP pending  ## Disposition:  --Recommend memory care unit if possible but defer to SW  ## Behavioral / Environmental:  --  delirium precautions     ##Legal Status Patient with legal guardian  Thank you for this consult request. Recommendations have been communicated to the primary team.   Psychiatry will continue to follow.  Natalie Brown   NEW history  Relevant Aspects of Brown Course:  Admitted on 12/24/2021 for acute encephalopathy; has underlying rapidly progressive and likely terminal dementia.   Patient Report:  Unable to obtain report as patient is nonverbal.   Sherron Monday to mother briefly on 9/30 -  she consented to further taper of olanzapine. Will be by bedside around lunch tomorrow.    Psychiatric History:  Information collected from chart. Bipolar affective disorder with psychotic features, MNCD Admitted to [facility] since Sep 06, 2021  Family psych history: Patient reports mental illness runs in her father's side of the family.   Social History:  Tobacco use: Half PPD; no use since admission Alcohol use: No use since admission Drug use: Denies; UDS negative on admission in May  Family History:  No pertinent FHx  Medical History: Past Medical History:  Diagnosis Date   Bipolar 1 disorder with moderate mania (HCC) 12/30/2013   Hypertension     Surgical History: History reviewed. No pertinent surgical history.  Medications:   Current Facility-Administered Medications:    (feeding supplement) PROSource Plus liquid 30 mL, 30 mL, Oral, BID BM, Danford, Earl Lites, MD, 30 mL at 01/20/23 1034   acetaminophen (TYLENOL) tablet 650 mg, 650 mg, Oral, Q6H PRN, Natalie Mustard, MD, 650 mg at 12/17/22 2023   alum & mag hydroxide-simeth (MAALOX/MYLANTA) 200-200-20 MG/5ML suspension 30 mL, 30 mL, Oral, Q4H PRN, Natalie Mustard, MD   bisacodyl (DULCOLAX)  suppository 10 mg, 10 mg, Rectal, Daily PRN, Kc, Ramesh, MD   cyanocobalamin (VITAMIN B12) tablet 500 mcg, 500 mcg, Oral, Daily, Hazeline Junker B, MD, 500 mcg at 01/20/23 1034   enoxaparin (LOVENOX) injection 40 mg, 40 mg, Subcutaneous, Daily, Vann, Jessica U, DO, 40 mg at 01/20/23 1034   feeding supplement (ENSURE ENLIVE / ENSURE PLUS) liquid 237 mL, 237 mL, Oral, BID BM, Danford, Christopher P, MD, 237 mL at 01/20/23 1035   fluticasone (FLONASE) 50 MCG/ACT nasal spray 1 spray, 1 spray, Each Nare, Daily PRN, Natalie Mustard, MD   hydrocerin (EUCERIN) cream, , Topical, BID, Natalie Mustard, MD, Given at 01/20/23 1035   ipratropium-albuterol (DUONEB) 0.5-2.5 (3) MG/3ML nebulizer solution 3 mL, 3 mL, Nebulization, Q4H PRN, Amin, Ankit C, MD   lactulose (CHRONULAC) 10 GM/15ML solution 20 g, 20 g, Oral, BID PRN, Alanda Slim, Taye T, MD   LORazepam (ATIVAN) 2 MG/ML concentrated solution 1 mg, 1 mg, Oral, TID, Mariel Craft, MD, 1 mg at 01/20/23 1034   LORazepam (ATIVAN) tablet 1 mg, 1 mg, Oral, Q6H PRN, Hillary Bow, DO, 1 mg at 01/18/23 2004   multivitamin with minerals tablet 1 tablet, 1 tablet, Oral, Daily, Amin, Ankit C, MD, 1 tablet at 01/20/23 1034   OLANZapine zydis (ZYPREXA) disintegrating tablet 2.5 mg, 2.5 mg, Oral, QHS, Caidyn Blossom A   ondansetron (ZOFRAN) tablet 4 mg, 4 mg, Oral, Q8H PRN, Natalie Mustard, MD, 4 mg at 03/17/22 0139  Oral care mouth rinse, 15 mL, Mouth Rinse, PRN, Thedore Mins, Stanford Scotland, MD   polyethylene glycol (MIRALAX / GLYCOLAX) packet 17 g, 17 g, Oral, Daily, Pokhrel, Laxman, MD, 17 g at 01/19/23 0920   Tdap (BOOSTRIX) injection 0.5 mL, 0.5 mL, Intramuscular, Once, Vann, Jessica U, DO  Allergies: Allergies  Allergen Reactions   Penicillins Itching       Objective  Vital signs:  Temp:  [97.6 F (36.4 C)-97.9 F (36.6 C)] 97.6 F (36.4 C) (09/30 0541) Pulse Rate:  [72-129] 72 (09/29 2300) Resp:  [18] 18 (09/30 0541) BP: (93-177)/(63-152) 93/67 (09/30  0541) SpO2:  [100 %] 100 % (09/29 2101)  Psychiatric Specialty Exam:  Geselle Giasson a 57 year old AA female, in her bed. She appeared slightly younger than his stated age and appears malnourished. During the interview, she was observed to be rocking back and forth in bed, briefly tracked examiner with eyes but was otherwise non-interaactive.  She did not attempt to vocalize; still nonverbal today. Impossible to judge insight/judgment/attention/orientation but assume lacking/poor. She did not appear to be responding to internal stimuli.    Physical Exam: Physical Exam Vitals reviewed.  Constitutional:      General: [name] not in acute distress.    Appearance: [name] not toxic-appearing.     Comments: thin  HENT:     Head: Normocephalic and atraumatic.  Pulmonary:     Effort: Pulmonary effort is normal.  Neurological:     General: No focal deficit present.     Blood pressure 93/67, pulse 72, temperature 97.6 F (36.4 C), temperature source Oral, resp. rate 18, height 5\' 6"  (1.676 m), weight 44.6 kg, SpO2 100%. Body mass index is 15.87 kg/m.

## 2023-01-20 NOTE — Plan of Care (Signed)

## 2023-01-21 LAB — AMMONIA: Ammonia: 32 umol/L (ref 9–35)

## 2023-01-21 NOTE — Progress Notes (Signed)
PROGRESS NOTE    Natalie Brown  ZOX:[id number] DOB: 03-26-1966 DOA: 12/24/2021 PCP: Lavinia Sharps, NP   Brief Narrative: 57 y.o. 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    initially admitted to [facility] 08/2021 with agitation and aggressive behaviors.  Rx manic Bipolar, but after several months in [facility] noted significant cognitive impairment, and dementia was suspected.  Patient was observed to have significant short term memory impairment and worsening hygiene, MOCA 11/30 and CT head revealed advanced cerebral atrophy.     September 2023, patient was transferred for further evaluation and treatment due to concerns of new metabolic encephalopathy (that appear to have developed acutely over ~1 day).  Neurology and Psychiatry were consulted.  EEG was unremarkable.  Ammonia, TSH and RPR were normal.  MRI brain revealed no acute findings, but confirmed advanced atrophy.  Neurology suspected a particularly aggressive premature dementia.     Initially forgetful but redirectable and oriented, she is now abulic and essentially nonverbal (nursing note occasional staff or some family members can elicit more responses, but they are very limited).  Cannot bathe or toilet independently.  Patient holds food in the mouth, recent calorie count (7/26-7/29) was observed to eat 50% of meals or less.   Nursing report she does not swallow her pills regularly due to pocketing of pills or holding pills in the mouth for long periods.  In the last 6 months, she has had 22 kg weight loss and has become noticeably weaker and more prone to falls when attempting to walk (which she cannot do safely without assistance).   Palliative have been consulted and have met with family. Family are aware her dementia and weight loss are terminal.   Patient developed lower extremity tremor seeming to coincide with up-titration of Zyprexa.  Unclear if this is a medication reaction/adverse effect vs primary vs behavioral. Neurology consulted.   Assessment and Plan:  Early onset dementia Noted.  Palliative care consulted patient has been transitioned to comfort measures.  Bipolar disorder Psychiatry consult this admission.  Olanzapine dosing adjusted per psychiatry recommendations.  Patient is also on lorazepam scheduled in addition to as needed.  Severe protein calorie malnutrition Severe weight loss since admission from September 2023.  Vitamin B12 deficiency -Continue vitamin B12 supplementation  CKD stage IIIa Stable.  Primary hypertension No longer on medical therapy.  Tremor Unclear etiology. It is possible these could be related to antipsychotic therapy and development of dystonia. Possibly primary neurologic or behavioral, as well. Psychiatry re-consulted; unclear if dystonia but agree with trail of benadryl and decrease to Zyprexa 5 mg. Per mother, no significant improvement after Benadryl. Movements unchanged. -Psychiatry recommendations: further decrease to Zyprexa 2.5 mg qhS; psychiatry continuing to follow -Neurology consult to rule out primary neurologic etiology   DVT prophylaxis: Lovenox Code Status:   Code Status: Do not attempt resuscitation (DNR) - Comfort care Family Communication: None at bedside. Disposition Plan: Discharge to nursing home likely in 24 hours pending neurology evaluation   Consultants:  Neurology Psychiatry  Procedures:  9/5 >> 9/6: EEG  Antimicrobials: None   Subjective: Patient is non-verbal.  Objective: BP 90/74 (BP Location: Right Arm)   Pulse 97   Temp 98.4 F (36.9 C)   Resp 18   Ht 5\' 6"  (1.676 m)   Wt 46 kg   SpO2 100%   BMI 16.37 kg/m   Examination:  General exam: Appears calm  and comfortable. Asleep in bed.   Data Reviewed: I have personally reviewed following labs and imaging studies  CBC Lab Results  Component Value Date   WBC 5.0  12/12/2022   RBC 5.03 12/12/2022   HGB 14.6 12/12/2022   HCT 45.5 12/12/2022   MCV 90.5 12/12/2022   MCH 29.0 12/12/2022   PLT 304 12/12/2022   MCHC 32.1 12/12/2022   RDW 14.5 12/12/2022   LYMPHSABS 1.4 09/04/2022   MONOABS 0.3 09/04/2022   EOSABS 0.1 09/04/2022   BASOSABS 0.0 09/04/2022     Last metabolic panel Lab Results  Component Value Date   NA 140 01/20/2023   K 3.8 01/20/2023   CL 106 01/20/2023   CO2 24 01/20/2023   BUN 17 01/20/2023   CREATININE 1.10 (H) 01/20/2023   GLUCOSE 104 (H) 01/20/2023   GFRNONAA 59 (L) 01/20/2023   GFRAA 65 09/19/2017   CALCIUM 8.6 (L) 01/20/2023   PHOS 3.5 10/28/2022   PROT 6.0 (L) 01/20/2023   ALBUMIN 2.8 (L) 01/20/2023   LABGLOB 2.8 06/21/2020   AGRATIO 1.5 06/21/2020   BILITOT 0.5 01/20/2023   ALKPHOS 53 01/20/2023   AST 19 01/20/2023   ALT 15 01/20/2023   ANIONGAP 10 01/20/2023    GFR: Estimated Creatinine Clearance: 42 mL/min (A) (by C-G formula based on SCr of 1.1 mg/dL (H)).  No results found for this or any previous visit (from the past 240 hour(s)).    Radiology Studies: No results found.    LOS: 391 days    Jacquelin Hawking, MD [facility] 01/21/2023, 11:18 AM   If 7PM-7AM, please contact night-coverage [email or web address]

## 2023-01-21 NOTE — TOC Progression Note (Signed)
Transition of Care [facility]) - Progression Note    Patient Details  Name: Natalie [facility] MRN: [id number] Date of Birth: 06-24-1965  [facility] [place] CM/SW Contact  Inis Sizer, LCSW Phone Number: 01/21/2023, 1:10 PM  Clinical Narrative:    CSW spoke with patient's mother [name] to answer her questions regarding the facility that patient will be discharged to tomorrow. [name] states she will communicate with her other daughter to determine how patient will get to the facility tomorrow. [name] will return call to CSW with a decision.     Barriers to Discharge: Financial Resources, Inadequate or no insurance, Homeless with medical needs  Expected Discharge Plan and Services In-house Referral: Clinical Social Work   Post Acute Care Choice: Nursing Home Living arrangements for the past 2 months: Homeless [facility]) Expected Discharge Date: 12/24/21                                     Social Determinants of Health (SDOH) Interventions SDOH Screenings   Food Insecurity: No Food Insecurity (03/05/2022)  Housing: High Risk (03/21/2022)  Alcohol Screen: Low Risk  (09/06/2021)  Depression (PHQ2-9): Medium Risk (05/26/2020)  Tobacco Use: High Risk (03/21/2022)    Readmission Risk Interventions     No data to display

## 2023-01-21 NOTE — Progress Notes (Signed)
Subjective: On arrival to see the patient, she is noted to be walking in hallway holding her food tray with no no assistance required and no tremors seen. After returning to her bed, she was restless, not following commands, with no verbal responses to questions.   Objective: Current vital signs: BP 90/74 (BP Location: Right Arm)   Pulse 97   Temp 98.4 F (36.9 C)   Resp 18   Ht 5\' 6"  (1.676 m)   Wt 46 kg   SpO2 100%   BMI 16.37 kg/m  Vital signs in last 24 hours: Temp:  [97.5 F (36.4 C)-98.4 F (36.9 C)] 98.4 F (36.9 C) (10/01 0825) Pulse Rate:  [57-97] 97 (10/01 0825) Resp:  [16-18] 18 (10/01 0825) BP: (90-99)/(61-74) 90/74 (10/01 0825) SpO2:  [100 %] 100 % (10/01 0825) Weight:  [46 kg] 46 kg (10/01 0400)  Intake/Output from previous day: No intake/output data recorded. Intake/Output this shift: No intake/output data recorded. Nutritional status:  Diet Order             DIET DYS 3 Room service appropriate? Yes; Fluid consistency: Thin  Diet effective now                  HEENT: [place]/AT Lungs: Respirations unlabored. Occasional sonorous quality as her LOC waxes and wanes Ext: No edema  Neurologic Exam: Ment: Awake and alert. Does not follow commands. Does not respond verbally.  CN: PERRL. Blinks to threat on the right but not on the left. Eyes are conjugate and near the midline. Left facial droop is noted. Responds to tactile stimulation to right side of face, but not left.  Motor: Not cooperative with examination. Spontaneous movement in all extremities with resistance, but with generalized weakness. No tremors seen.  Sensory: Responds to stimuli but otherwise not cooperative with sensory exam. Cerebellar: No gross ataxia noted Gait: Steady, no assistance needed.  Lab Results: Results for orders placed or performed during the hospital encounter of 12/24/21 (from the past 48 hour(s))  Comprehensive metabolic panel     Status: Abnormal   Collection Time:  01/20/23 11:13 PM  Result Value Ref Range   Sodium 140 135 - 145 mmol/L   Potassium 3.8 3.5 - 5.1 mmol/L   Chloride 106 98 - 111 mmol/L   CO2 24 22 - 32 mmol/L   Glucose, Bld 104 (H) 70 - 99 mg/dL    Comment: Glucose reference range applies only to samples taken after fasting for at least 8 hours.   BUN 17 6 - 20 mg/dL   Creatinine, Ser 0.98 (H) 0.44 - 1.00 mg/dL   Calcium 8.6 (L) 8.9 - 10.3 mg/dL   Total Protein 6.0 (L) 6.5 - 8.1 g/dL   Albumin 2.8 (L) 3.5 - 5.0 g/dL   AST 19 15 - 41 U/L   ALT 15 0 - 44 U/L   Alkaline Phosphatase 53 38 - 126 U/L   Total Bilirubin 0.5 0.3 - 1.2 mg/dL   GFR, Estimated 59 (L) >60 mL/min    Comment: (NOTE) Calculated using the CKD-EPI Creatinine Equation (2021)    Anion gap 10 5 - 15    Comment: Performed at [facility], [address], [place], [place] [address]  Magnesium     Status: None   Collection Time: 01/20/23 11:13 PM  Result Value Ref Range   Magnesium 2.1 1.7 - 2.4 mg/dL    Comment: Performed at [facility], [address], [place], [place] [address]  No results found for this or any previous visit (from the past 240 hour(s)).  Lipid Panel No results for input(s): "CHOL", "TRIG", "HDL", "CHOLHDL", "VLDL", "LDLCALC" in the last 72 hours.  Studies/Results: No results found.  Medications: Scheduled:  (feeding supplement) PROSource Plus  30 mL Oral BID BM   vitamin B-12  500 mcg Oral Daily   enoxaparin (LOVENOX) injection  40 mg Subcutaneous Daily   feeding supplement  237 mL Oral BID BM   hydrocerin   Topical BID   LORazepam  1 mg Oral TID   multivitamin with minerals  1 tablet Oral Daily   OLANZapine zydis  2.5 mg Oral QHS   polyethylene glycol  17 g Oral Daily   Tdap  0.5 mL Intramuscular Once     Assessment: 57 y.o. 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 Neurology was consulted for BLE tremor-like movements.   - On exam, no tremors are noted, but is restless, which may be behavioral versus a side effect of Zyprexa. However, she does not have any rigidity.  - A small dose benadryl or hydroxyzine could be added to help combat possible extrapyramidal symptoms. Could also consider discontinuation of her Zyprexa or changing to an antipsychotic with lower risk of extrapyramidal effects such as Seroquel.  - No sustained tachycardia seen on review of chart, which has been checked as tachycardia can also be an effect of the Zyprexa.  - Her ammonia was last checked 2 months ago and was elevated at that time, this could also be contributing to her AMS if it is still elevated.  - CT Brain 7/16: No acute intracranial abnormality  Recommendations: - Repeat ammonia level - Add Benadryl or Vistaril to combat the restlessness possibly caused by Zyprexa, versus discontinuation of Zyprexa or switching to a trial of Seroquel - No further imaging indicated at this time.    Pt seen by Neuro NP/APP.   Lynnae January, DNP, AGACNP-BC [facility] Please use AMION for contact information & EPIC for messaging.    LOS: 391 days   Electronically signed: Dr. Caryl Pina 01/21/2023  11:02 AM

## 2023-01-21 NOTE — Plan of Care (Signed)

## 2023-01-22 ENCOUNTER — Emergency Department (HOSPITAL_COMMUNITY): Payer: MEDICAID

## 2023-01-22 ENCOUNTER — Other Ambulatory Visit: Payer: Self-pay

## 2023-01-22 ENCOUNTER — Inpatient Hospital Stay (HOSPITAL_COMMUNITY)
Admission: EM | Admit: 2023-01-22 | Discharge: 2024-02-21 | DRG: 884 | Disposition: E | Payer: MEDICAID | Source: Skilled Nursing Facility | Attending: Family Medicine | Admitting: Family Medicine

## 2023-01-22 DIAGNOSIS — R45851 Suicidal ideations: Secondary | ICD-10-CM | POA: Diagnosis present

## 2023-01-22 DIAGNOSIS — Z681 Body mass index (BMI) 19 or less, adult: Secondary | ICD-10-CM

## 2023-01-22 DIAGNOSIS — N1831 Chronic kidney disease, stage 3a: Secondary | ICD-10-CM | POA: Diagnosis present

## 2023-01-22 DIAGNOSIS — R627 Adult failure to thrive: Secondary | ICD-10-CM | POA: Diagnosis present

## 2023-01-22 DIAGNOSIS — E870 Hyperosmolality and hypernatremia: Secondary | ICD-10-CM | POA: Diagnosis present

## 2023-01-22 DIAGNOSIS — R159 Full incontinence of feces: Secondary | ICD-10-CM | POA: Diagnosis present

## 2023-01-22 DIAGNOSIS — R64 Cachexia: Secondary | ICD-10-CM | POA: Diagnosis present

## 2023-01-22 DIAGNOSIS — Z515 Encounter for palliative care: Principal | ICD-10-CM

## 2023-01-22 DIAGNOSIS — F29 Unspecified psychosis not due to a substance or known physiological condition: Secondary | ICD-10-CM | POA: Diagnosis present

## 2023-01-22 DIAGNOSIS — I10 Essential (primary) hypertension: Secondary | ICD-10-CM | POA: Diagnosis present

## 2023-01-22 DIAGNOSIS — Z9181 History of falling: Secondary | ICD-10-CM

## 2023-01-22 DIAGNOSIS — G9340 Encephalopathy, unspecified: Secondary | ICD-10-CM | POA: Diagnosis present

## 2023-01-22 DIAGNOSIS — R0682 Tachypnea, not elsewhere classified: Secondary | ICD-10-CM | POA: Diagnosis not present

## 2023-01-22 DIAGNOSIS — E538 Deficiency of other specified B group vitamins: Secondary | ICD-10-CM | POA: Diagnosis present

## 2023-01-22 DIAGNOSIS — F439 Reaction to severe stress, unspecified: Secondary | ICD-10-CM | POA: Diagnosis present

## 2023-01-22 DIAGNOSIS — Z604 Social exclusion and rejection: Secondary | ICD-10-CM | POA: Diagnosis present

## 2023-01-22 DIAGNOSIS — W06XXXA Fall from bed, initial encounter: Secondary | ICD-10-CM | POA: Diagnosis not present

## 2023-01-22 DIAGNOSIS — Z66 Do not resuscitate: Secondary | ICD-10-CM | POA: Diagnosis present

## 2023-01-22 DIAGNOSIS — M6250 Muscle wasting and atrophy, not elsewhere classified, unspecified site: Secondary | ICD-10-CM | POA: Diagnosis present

## 2023-01-22 DIAGNOSIS — Z751 Person awaiting admission to adequate facility elsewhere: Secondary | ICD-10-CM

## 2023-01-22 DIAGNOSIS — F03C3 Unspecified dementia, severe, with mood disturbance: Secondary | ICD-10-CM | POA: Diagnosis present

## 2023-01-22 DIAGNOSIS — E860 Dehydration: Secondary | ICD-10-CM | POA: Diagnosis present

## 2023-01-22 DIAGNOSIS — R7989 Other specified abnormal findings of blood chemistry: Secondary | ICD-10-CM | POA: Diagnosis not present

## 2023-01-22 DIAGNOSIS — F03918 Unspecified dementia, unspecified severity, with other behavioral disturbance: Secondary | ICD-10-CM | POA: Diagnosis present

## 2023-01-22 DIAGNOSIS — E43 Unspecified severe protein-calorie malnutrition: Secondary | ICD-10-CM | POA: Diagnosis present

## 2023-01-22 DIAGNOSIS — Z880 Allergy status to penicillin: Secondary | ICD-10-CM

## 2023-01-22 DIAGNOSIS — R000 Tachycardia, unspecified: Secondary | ICD-10-CM | POA: Diagnosis present

## 2023-01-22 DIAGNOSIS — R32 Unspecified urinary incontinence: Secondary | ICD-10-CM | POA: Diagnosis present

## 2023-01-22 DIAGNOSIS — R4189 Other symptoms and signs involving cognitive functions and awareness: Secondary | ICD-10-CM | POA: Diagnosis present

## 2023-01-22 DIAGNOSIS — F17200 Nicotine dependence, unspecified, uncomplicated: Secondary | ICD-10-CM | POA: Diagnosis present

## 2023-01-22 DIAGNOSIS — E861 Hypovolemia: Secondary | ICD-10-CM | POA: Diagnosis present

## 2023-01-22 DIAGNOSIS — E8809 Other disorders of plasma-protein metabolism, not elsewhere classified: Secondary | ICD-10-CM | POA: Diagnosis present

## 2023-01-22 DIAGNOSIS — E871 Hypo-osmolality and hyponatremia: Secondary | ICD-10-CM | POA: Diagnosis present

## 2023-01-22 DIAGNOSIS — R451 Restlessness and agitation: Principal | ICD-10-CM | POA: Diagnosis present

## 2023-01-22 DIAGNOSIS — G249 Dystonia, unspecified: Secondary | ICD-10-CM | POA: Diagnosis present

## 2023-01-22 DIAGNOSIS — F03C4 Unspecified dementia, severe, with anxiety: Secondary | ICD-10-CM | POA: Diagnosis present

## 2023-01-22 DIAGNOSIS — F03C18 Unspecified dementia, severe, with other behavioral disturbance: Principal | ICD-10-CM | POA: Diagnosis present

## 2023-01-22 DIAGNOSIS — S51011A Laceration without foreign body of right elbow, initial encounter: Secondary | ICD-10-CM | POA: Diagnosis not present

## 2023-01-22 DIAGNOSIS — I129 Hypertensive chronic kidney disease with stage 1 through stage 4 chronic kidney disease, or unspecified chronic kidney disease: Secondary | ICD-10-CM | POA: Diagnosis present

## 2023-01-22 DIAGNOSIS — Z79899 Other long term (current) drug therapy: Secondary | ICD-10-CM

## 2023-01-22 DIAGNOSIS — R410 Disorientation, unspecified: Secondary | ICD-10-CM | POA: Diagnosis present

## 2023-01-22 DIAGNOSIS — Z781 Physical restraint status: Secondary | ICD-10-CM

## 2023-01-22 DIAGNOSIS — Y92230 Patient room in hospital as the place of occurrence of the external cause: Secondary | ICD-10-CM | POA: Diagnosis not present

## 2023-01-22 DIAGNOSIS — Z8616 Personal history of COVID-19: Secondary | ICD-10-CM

## 2023-01-22 DIAGNOSIS — F319 Bipolar disorder, unspecified: Secondary | ICD-10-CM | POA: Diagnosis present

## 2023-01-22 MED ORDER — LORAZEPAM 1 MG PO TABS
1.0000 mg | ORAL_TABLET | Freq: Four times a day (QID) | ORAL | Status: DC | PRN
  Administered 2023-01-22 – 2023-02-08 (×9): 1 mg via ORAL
  Filled 2023-01-22 (×9): qty 1

## 2023-01-22 MED ORDER — HYDROXYZINE HCL 25 MG PO TABS
25.0000 mg | ORAL_TABLET | Freq: Three times a day (TID) | ORAL | Status: DC
  Administered 2023-01-22 – 2023-02-08 (×43): 25 mg via ORAL
  Filled 2023-01-22 (×48): qty 1

## 2023-01-22 MED ORDER — LORAZEPAM 2 MG/ML IJ SOLN
0.5000 mg | INTRAMUSCULAR | Status: AC
  Administered 2023-01-22: 0.5 mg via INTRAMUSCULAR

## 2023-01-22 MED ORDER — ENSURE ENLIVE PO LIQD: 237.0000 mL | Freq: Two times a day (BID) | ORAL | Status: DC

## 2023-01-22 MED ORDER — LORAZEPAM 1 MG PO TABS: 1.0000 mg | ORAL_TABLET | Freq: Four times a day (QID) | ORAL | 0 refills | Status: DC | PRN

## 2023-01-22 MED ORDER — OLANZAPINE 5 MG PO TBDP: 2.5000 mg | ORAL_TABLET | Freq: Every day | ORAL | Status: DC

## 2023-01-22 MED ORDER — LORAZEPAM 2 MG/ML PO CONC: 1.0000 mg | Freq: Three times a day (TID) | ORAL | 0 refills | Status: DC

## 2023-01-22 MED ORDER — ADULT MULTIVITAMIN W/MINERALS CH: 1.0000 | ORAL_TABLET | Freq: Every day | ORAL | Status: DC

## 2023-01-22 MED ORDER — LACTULOSE 10 GM/15ML PO SOLN: 20.0000 g | Freq: Two times a day (BID) | ORAL | Status: DC | PRN

## 2023-01-22 MED ORDER — PROSOURCE PLUS PO LIQD: 30.0000 mL | Freq: Two times a day (BID) | ORAL | Status: DC

## 2023-01-22 MED ORDER — LORAZEPAM 2 MG/ML IJ SOLN
INTRAMUSCULAR | Status: AC
  Filled 2023-01-22: qty 1

## 2023-01-22 MED ORDER — POLYETHYLENE GLYCOL 3350 17 G PO PACK
17.0000 g | PACK | Freq: Every day | ORAL | 0 refills | Status: DC
Start: 2023-01-23 — End: 2024-01-22
  Filled 2023-01-22: qty 14, 14d supply, fill #0

## 2023-01-22 NOTE — ED Provider Notes (Addendum)
  [place] EMERGENCY DEPARTMENT AT [facility] Provider Note   CSN: [id number] Arrival date & time: 01/22/23  1531     History Chief Complaint  Patient presents with   Agitation    HPI Natalie Brown is a 57 y.o. female presenting for chief complaint of altered mentation.  Patient's recorded medical, surgical, social, medication list and allergies were reviewed in the Snapshot window as part of the initial history.   Review of Systems   Review of Systems  Unable to perform ROS: Dementia    Physical Exam Updated Vital Signs BP (!) 147/125   Resp (!) 22  Physical Exam Constitutional:      General: [name] not in acute distress.    Appearance: [name] not ill-appearing or toxic-appearing.  HENT:     Head: Normocephalic and atraumatic.  Eyes:     Extraocular Movements: Extraocular movements intact.     Pupils: Pupils are equal, round, and reactive to light.  Cardiovascular:     Rate and Rhythm: Normal rate.  Pulmonary:     Effort: No respiratory distress.  Abdominal:     General: Abdomen is flat.  Musculoskeletal:        General: No swelling, deformity or signs of injury.     Cervical back: Normal range of motion. No rigidity.  Skin:    General: Skin is warm and dry.  Neurological:     Mental Status: [name] alert.  Psychiatric:        Mood and Affect: Mood normal.      ED Course/ Medical Decision Making/ A&P    Procedures Procedures   Medications Ordered in ED Medications - No data to display  Medical Decision Making:   Patient's history present on some physical exam findings are without any focal pathology.  She appears exactly as described on her discharge summary.  Legal guardian is at bedside and states that the facility they were sent to effectively declined the patient stating they would not be able to provide care for her so they called the EMS team back to take her back to the hospital.  I informed the discharging team about patient's  bounce back.  They recommended consulting the admitting team.  Consulted the admitting team who is coming to evaluate patient.  Disposition:  Addendum: I received a call back from the admitting team ([facility]) that they will not evaluate at bedside without further workup.  They have requested blood work, urine studies, CT head per my conversation with them.  They have stated that they will not be admitting the patient at this time. Orders placed per request.  Escalation to appropriate channels.  Patient placed into transitions of care ED boarding protocol.  Emergency Department Medication Summary:   Medications - No data to display      Clinical Impression:  1. Agitation      Admit   Final Clinical Impression(s) / ED Diagnoses Final diagnoses:  Agitation    Rx / DC Orders ED Discharge Orders     None         Glyn Ade, MD 01/22/23 1621    Glyn Ade, MD 01/22/23 [phone]

## 2023-01-22 NOTE — Progress Notes (Signed)
Report called to the [facility] at 1030 AM.

## 2023-01-22 NOTE — ED Triage Notes (Signed)
Vitals for PTAR 110/76  RR18 O2 100% HR 102  Left [facility] today, PTAR was taking to the SNF for the first time after being here for 362 days waiting for placement.  PTAR got her off the stretcher and as soon as they walked away she became agitated hitting staff and screaming so they made PTAR bring her back here because they stated they couldn't care for her.

## 2023-01-22 NOTE — TOC Transition Note (Signed)
Transition of Care [name]) - CM/SW Discharge Note   Patient Details  Name: Natalie Brown MRN: [id number] Date of Birth: 05-Dec-1965  Transition of Care [facility]) CM/SW Contact:  Inis Sizer, LCSW Phone Number: 01/22/2023, 8:27 AM   Clinical Narrative:    CSW spoke with patient's mother [name] who states she wants patient transported to [facility] via [place] for safety. [name] agreed to have patient scheduled for transport at 1pm.  CSW spoke with MD who states patient is medically stable for discharge to [facility] today.  CSW informed RN of discharge plan.  Patient will discharge to [facility] via [facility] - scheduled for pick up at 1pm. The number to call for report is [phone].   Barriers to Discharge: No Barriers Identified   Patient Goals and CMS Choice      Discharge Placement                         Discharge Plan and Services Additional resources added to the After Visit Summary for   In-house Referral: Clinical Social Work   Post Acute Care Choice: NA (Assisted Living)                               Social Determinants of Health (SDOH) Interventions SDOH Screenings   Food Insecurity: No Food Insecurity (03/05/2022)  Housing: High Risk (03/21/2022)  Alcohol Screen: Low Risk  (09/06/2021)  Depression (PHQ2-9): Medium Risk (05/26/2020)  Tobacco Use: High Risk (03/21/2022)     Readmission Risk Interventions     No data to display

## 2023-01-22 NOTE — Progress Notes (Signed)
Call received from EDP with a request for admission. Patient was discharged this afternoon after a prolonged hospitalization of more than year for difficult disposition status.  Per report, she was discharged to a facility today.  However soon after getting there, she started getting agitated and hitting the staff and hence the same EMS brought her back to the ED.  Vital signs stable.  No new labs or investigation. At this time, I do not see any new medical issues warranting admission. I have suggested ED physician to start basic workup and call for medical consultation if warranted. Patient will benefit from evaluation by psychiatry while in the ED.  Since this is not a typical usual case, I have run this issue up to the hospital leadership as well. Admission or medical consultation deferred for now.

## 2023-01-22 NOTE — Plan of Care (Signed)

## 2023-01-22 NOTE — Care Management (Signed)
Patient was discharge today from inpatient [facility] program to [facility] ALF after a year. However, when patient arrived by [facility] she became combative with staff and they refused to accept patient. Patient was returned to [facility] ED.  TOC Leadership and Hospital Administration has been involved with the difficult placement issues. ED TOC team will follow up [facility] leadership in the am.

## 2023-01-22 NOTE — ED Notes (Signed)
Pt and family refuse to allow any care. Blood/ct/swab

## 2023-01-22 NOTE — ED Notes (Signed)
Unable to assess pt VS due to non-compliance

## 2023-01-22 NOTE — ED Notes (Signed)
Unable to draw labs at this time due to pt's agitation & constant moving.

## 2023-01-22 NOTE — ED Notes (Signed)
CT attempted again but unsuccessful due to pt moving & not responding to verbal instructions by staff.

## 2023-01-22 NOTE — Discharge Summary (Signed)
Physician Discharge Summary  Natalie Brown GNF:[id number] DOB: [birth year] DOA: 12/24/2021  PCP: Lavinia Sharps, NP  Admit date: 12/24/2021 Discharge date: 01/22/2023 Recommendations for Outpatient Follow-up:  Follow up with PCP in 1 weeks-call for appointment Please obtain BMP/CBC in one week  Discharge Dispo: ALF with locked unit Discharge Condition: Stable Code Status:   Code Status: Do not attempt resuscitation (DNR) - Comfort care Diet recommendation:  Diet Order             Diet - low sodium heart healthy           DIET DYS 3 Room service appropriate? Yes; Fluid consistency: Thin  Diet effective now                    Brief/Interim Summary: 57 y.o. 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  initially admitted to [facility] 08/2021 with agitation and aggressive behaviors.  Rx manic Bipolar, but after several months in [facility] noted significant cognitive impairment, and dementia was suspected.  Patient was observed to have significant short term memory impairment and worsening hygiene, MOCA 11/30 and CT head revealed advanced cerebral atrophy.    September 2023, patient was transferred for further evaluation and treatment due to concerns of new metabolic encephalopathy (that appear to have developed acutely over ~1 day).  Neurology and Psychiatry were consulted.  EEG was unremarkable.  Ammonia, TSH and RPR were normal.  MRI brain revealed no acute findings, but confirmed advanced atrophy.  Neurology suspected a particularly aggressive premature dementia.     Initially forgetful but redirectable and oriented, she is now abulic and essentially nonverbal (nursing note occasional staff or some family members can elicit more responses, but they are very limited).  Cannot bathe or toilet independently.  Patient holds food in the mouth, recent calorie count (7/26-7/29) was observed to eat 50% of meals or  less.   Nursing report she does not swallow her pills regularly due to pocketing of pills or holding pills in the mouth for long periods.  In the last 6 months, she has had 22 kg weight loss and has become noticeably weaker and more prone to falls when attempting to walk (which she cannot do safely without assistance).   Palliative have been consulted and have met with family. Family are aware her dementia and weight loss are terminal.  Patient developed lower extremity tremor seeming to coincide with up-titration of Zyprexa. Unclear if this is a medication reaction/adverse effect vs primary vs behavioral. Neurology consulted and ammonia checked and normal, tremors are noted but mostly when she is restless behavioral versus side effects of Zyprexa, but does not have rigidity consider a small dose of Benadryl or hydroxyzine prn, no need for repeating further imaging, follow-up with psychiatry. Disposition has been decided in agreement with patient's guardian mother and other involved parties, all consultants have cleared the patient at this time she is ambulating, self-feeding and is medically stable for discharge to a facility with locked unit-confirmed with Child psychotherapist. Her discharge was scheduled for 01/22/2023.   Discharge Diagnoses:  Principal Problem:   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) Active Problems:   Delirium due to multiple etiologies, acute, hyperactive   Bipolar affective disorder, current episode manic with psychotic symptoms (HCC)   Sinus tachycardia   Tobacco abuse   Altered mental state   B12 deficiency   COVID-19 virus infection   Drooling  Essential hypertension   Protein-calorie malnutrition, severe  Early onset dementia Nonverbal able to ambulate and self feed. Palliative care consulted patient has been transitioned to DNR-comfort measures. Patient has had extensive workup since her hospitalization including CT head MRI brain, most recent CT head  was in July/16/2024. For PRNs, would go with low potency antipsychotics (ie olanzapine) given prior poor response to high potency  prior propranolol d/c 03/2022, atropine dc 01/2022 Medical Decision Making Capacity:  Patient has interim legal guardian, mother [name] [phone]); clearly lacks capacity for most medical decisions.   Bipolar disorder Psychiatry consulted this admission-and had seen her on multiple occasions.Olanzapine dosing adjusted per psychiatry recommendations.  Patient is also on lorazepam scheduled in addition to as needed.   Severe protein calorie malnutrition Severe weight loss since admission from September 2023. Nutrition Problem: Severe Malnutrition Etiology: chronic illness (advanced dementia) Signs/Symptoms: energy intake < 75% for > or equal to 1 month, percent weight loss Percent weight loss: 15 % (in 3 months) Interventions: Calorie Count, Ensure Enlive (each supplement provides 350kcal and 20 grams of protein), MVI   Vitamin B12 deficiency -Continue vitamin B12 supplementation   CKD stage IIIa Stable.  Creatinine 1.0 on discharge   Primary hypertension No longer on medical therapy.   Tremor:Unclear etiology.  lower extremity tremor seeming to coincide with up-titration of Zyprexa. Unclear if this is a medication reaction/adverse effect vs primary vs behavioral. Neurology consulted and ammonia checked and normal, tremors are noted but mostly when she is restless behavioral versus side effects of Zyprexa, but does not have rigidity consider a small dose of Benadryl or hydroxyzine prn, no need for repeating further imaging, follow-up with psychiatry. Psychiatry recommendations: further decrease to Zyprexa 2.5 mg qhS  Consults: Neurology-multiple times Psychiatry-multiple times Palliative care Subjective: Alert awake nonverbal ambulating in the hallway, her boyfriend is at the bedside and in the hallway with her Blood pressure on recheck 104/56 earlier  was high but she was bit agitated  Discharge Exam: Vitals:   01/21/23 2010 01/22/23 1156  BP: (!) 195/131 (!) 104/56  Pulse: 100   Resp:    Temp: 99.9 F (37.7 C)   SpO2: 99% 100%   General: Pt is alert, awake, does not follow commands nonverbal Cardiovascular: RRR, S1/S2 +, no rubs, no gallops Respiratory: CTA bilaterally, no wheezing, no rhonchi Abdominal: Soft, NT, ND, bowel sounds + Extremities: no edema, no cyanosis  Discharge Instructions  Discharge Instructions     Diet - low sodium heart healthy   Complete by: As directed    Discharge instructions   Complete by: As directed    Please call call MD or return to ER for similar or worsening recurring problem that brought you to hospital or if any fever,nausea/vomiting,abdominal pain, uncontrolled pain, chest pain,  shortness of breath or any other alarming symptoms.  Please follow-up your doctor as instructed in a week time and call the office for appointment.  Please avoid alcohol, smoking, or any other illicit substance and maintain healthy habits including taking your regular medications as prescribed.  You were cared for by a hospitalist during your hospital stay. If you have any questions about your discharge medications or the care you received while you were in the hospital after you are discharged, you can call the unit and ask to speak with the hospitalist on call if the hospitalist that took care of you is not available.  Once you are discharged, your primary care physician will handle any further medical issues. Please  note that NO REFILLS for any discharge medications will be authorized once you are discharged, as it is imperative that you return to your primary care physician (or establish a relationship with a primary care physician if you do not have one) for your aftercare needs so that they can reassess your need for medications and monitor your lab values   Increase activity slowly   Complete by: As directed     No wound care   Complete by: As directed       Allergies as of 01/22/2023       Reactions   Penicillins Itching        Medication List     STOP taking these medications    Claritin 10 MG tablet Generic drug: loratadine   fluticasone 50 MCG/ACT nasal spray Commonly known as: FLONASE   pseudoephedrine-guaifenesin 60-600 MG 12 hr tablet Commonly known as: MUCINEX D       TAKE these medications    Briefs Overnight Medium Misc 1 each by Does not apply route every 4 (four) hours as needed (soiling).   feeding supplement Liqd Take 237 mLs by mouth 2 (two) times daily between meals.   (feeding supplement) PROSource Plus liquid Take 30 mLs by mouth 2 (two) times daily between meals.   hydrOXYzine 25 MG tablet Commonly known as: ATARAX Take 1 tablet (25 mg total) by mouth 3 (three) times daily as needed for anxiety.   lactulose 10 GM/15ML solution Commonly known as: CHRONULAC Take 30 mLs (20 g total) by mouth 2 (two) times daily as needed for mild constipation.   LORazepam 2 MG/ML concentrated solution Commonly known as: ATIVAN Take 0.5 mLs (1 mg total) by mouth 3 (three) times daily.   LORazepam 1 MG tablet Commonly known as: ATIVAN Take 1 tablet (1 mg total) by mouth every 6 (six) hours as needed (agitation).   multivitamin with minerals Tabs tablet Take 1 tablet by mouth daily. Start taking on: January 23, 2023   OLANZapine zydis 5 MG disintegrating tablet Commonly known as: ZYPREXA Take 0.5 tablets (2.5 mg total) by mouth at bedtime.   polyethylene glycol 17 g packet Commonly known as: MIRALAX / GLYCOLAX Take 17 g by mouth daily. Start taking on: January 23, 2023       ASK your doctor about these medications    cyanocobalamin 500 MCG tablet Commonly known as: VITAMIN B12 Take 1 tablet (500 mcg total) by mouth daily. Ask about: Should I take this medication?        Follow-up Information     [name], [name], NP Follow up in 1 week(s).    Contact information: [address] [place] [address] [phone]                Allergies  Allergen Reactions   Penicillins Itching    The results of significant diagnostics from this hospitalization (including imaging, microbiology, ancillary and laboratory) are listed below for reference.    Microbiology: No results found for this or any previous visit (from the past 240 hour(s)).  Procedures/Studies: No results found.  Labs: BNP (last 3 results) No results for input(s): "BNP" in the last 8760 hours. Basic Metabolic Panel: Recent Labs  Lab 01/20/23 2313  NA 140  K 3.8  CL 106  CO2 24  GLUCOSE 104*  BUN 17  CREATININE 1.10*  CALCIUM 8.6*  MG 2.1   Liver Function Tests: Recent Labs  Lab 01/20/23 2313  AST 19  ALT 15  ALKPHOS 53  BILITOT 0.5  PROT 6.0*  ALBUMIN 2.8*   No results for input(s): "LIPASE", "AMYLASE" in the last 168 hours. Recent Labs  Lab 01/21/23 1405  AMMONIA 32  Anemia work up No results for input(s): "VITAMINB12", "FOLATE", "FERRITIN", "TIBC", "IRON", "RETICCTPCT" in the last 72 hours. Urinalysis    Component Value Date/Time   COLORURINE YELLOW 06/28/2022 0630   APPEARANCEUR CLEAR 06/28/2022 0630   LABSPEC 1.021 06/28/2022 0630   PHURINE 5.0 06/28/2022 0630   GLUCOSEU NEGATIVE 06/28/2022 0630   HGBUR NEGATIVE 06/28/2022 0630   BILIRUBINUR NEGATIVE 06/28/2022 0630   BILIRUBINUR neg 08/07/2016 1526   KETONESUR NEGATIVE 06/28/2022 0630   PROTEINUR NEGATIVE 06/28/2022 0630   UROBILINOGEN 2.0 (A) 08/07/2016 1526   UROBILINOGEN 1 12/30/2013 1216   NITRITE NEGATIVE 06/28/2022 0630   LEUKOCYTESUR NEGATIVE 06/28/2022 0630   Time coordinating discharge: 35 minutes  SIGNED: Lanae Boast, MD  [facility] 01/22/2023, 12:22 PM  If 7PM-7AM, please contact night-coverage [email or web address]

## 2023-01-22 NOTE — ED Notes (Signed)
Security called 3 visitors somehow arrived at bedside when I explained the policy of 2 visitors one female visitor started yelling at this RN stating that I was being rude and walking down the cursing at me.  The boyfriend is in the hall constantly cursing loudly to where other rooms and visitors can here him.

## 2023-01-22 NOTE — Consult Note (Signed)
Brief Psychiatry Consult Note  This is late documentation for 10/1; saw pt briefly. She was essentially unchanged - nonverbal with no meaningful interaction with the team. I was able to meet her mom and have a sit down conversation to allay some fears about upcoming transfer. Mom advocated for stopping zyprexa, held off on this due to upcoming transfer - pt has been fairly agitated at points through hospitalization.   No charge due to late entry  [facility] Natalie Brown

## 2023-01-22 NOTE — ED Notes (Signed)
Pt up eating chips & drinking tea that family has provided. Pt completely ignores are verbal requests from staff.

## 2023-01-22 NOTE — ED Notes (Signed)
Pt unable to be transported to CT due to agitation.  Pt will not respond to verbal requests.  EDP made aware.

## 2023-01-23 ENCOUNTER — Encounter (HOSPITAL_COMMUNITY): Payer: Self-pay

## 2023-01-23 ENCOUNTER — Emergency Department (HOSPITAL_COMMUNITY): Payer: MEDICAID

## 2023-01-23 LAB — BASIC METABOLIC PANEL
Anion gap: 9 (ref 5–15)
BUN: 16 mg/dL (ref 6–20)
CO2: 25 mmol/L (ref 22–32)
Calcium: 8.8 mg/dL — ABNORMAL LOW (ref 8.9–10.3)
Chloride: 104 mmol/L (ref 98–111)
Creatinine, Ser: 1.21 mg/dL — ABNORMAL HIGH (ref 0.44–1.00)
GFR, Estimated: 53 mL/min — ABNORMAL LOW (ref >60)
Glucose, Bld: 112 mg/dL — ABNORMAL HIGH (ref 70–99)
Potassium: 3.9 mmol/L (ref 3.5–5.1)
Sodium: 138 mmol/L (ref 135–145)

## 2023-01-23 LAB — CBC WITH DIFFERENTIAL/PLATELET
Abs Immature Granulocytes: 0.01 10*3/uL (ref 0.00–0.07)
Basophils Absolute: 0 10*3/uL (ref 0.0–0.1)
Basophils Relative: 1 %
Eosinophils Absolute: 0.1 10*3/uL (ref 0.0–0.5)
Eosinophils Relative: 1 %
HCT: 37.2 % (ref 36.0–46.0)
Hemoglobin: 12.2 g/dL (ref 12.0–15.0)
Immature Granulocytes: 0 %
Lymphocytes Relative: 28 %
Lymphs Abs: 1.5 10*3/uL (ref 0.7–4.0)
MCH: 29.3 pg (ref 26.0–34.0)
MCHC: 32.8 g/dL (ref 30.0–36.0)
MCV: 89.2 fL (ref 80.0–100.0)
Monocytes Absolute: 0.3 10*3/uL (ref 0.1–1.0)
Monocytes Relative: 5 %
Neutro Abs: 3.5 10*3/uL (ref 1.7–7.7)
Neutrophils Relative %: 65 %
Platelets: 336 10*3/uL (ref 150–400)
RBC: 4.17 MIL/uL (ref 3.87–5.11)
RDW: 13.9 % (ref 11.5–15.5)
WBC: 5.4 10*3/uL (ref 4.0–10.5)
nRBC: 0 % (ref 0.0–0.2)

## 2023-01-23 LAB — BRAIN NATRIURETIC PEPTIDE: B Natriuretic Peptide: 9.4 pg/mL (ref 0.0–100.0)

## 2023-01-23 MED ORDER — HALOPERIDOL LACTATE 5 MG/ML IJ SOLN
INTRAMUSCULAR | Status: AC
  Administered 2023-01-23: 5 mg via INTRAMUSCULAR
  Filled 2023-01-23: qty 1

## 2023-01-23 MED ORDER — HALOPERIDOL LACTATE 5 MG/ML IJ SOLN: 5.0000 mg | Freq: Once | INTRAMUSCULAR | Status: AC

## 2023-01-23 MED ORDER — ENSURE ENLIVE PO LIQD
237.0000 mL | Freq: Two times a day (BID) | ORAL | Status: DC
  Administered 2023-01-23 – 2023-02-14 (×31): 237 mL via ORAL
  Filled 2023-01-23 (×30): qty 237

## 2023-01-23 MED ORDER — PROSOURCE PLUS PO LIQD
30.0000 mL | Freq: Two times a day (BID) | ORAL | Status: DC
  Administered 2023-01-23 – 2023-04-08 (×91): 30 mL via ORAL
  Filled 2023-01-23 (×141): qty 30

## 2023-01-23 MED ORDER — ADULT MULTIVITAMIN W/MINERALS CH
1.0000 | ORAL_TABLET | Freq: Every day | ORAL | Status: DC
  Administered 2023-01-23 – 2023-04-08 (×67): 1 via ORAL
  Filled 2023-01-23 (×72): qty 1

## 2023-01-23 MED ORDER — LORAZEPAM 2 MG/ML PO CONC: 1.0000 mg | Freq: Three times a day (TID) | ORAL | Status: DC | PRN

## 2023-01-23 MED ORDER — POLYETHYLENE GLYCOL 3350 17 G PO PACK
17.0000 g | PACK | Freq: Every day | ORAL | Status: DC
  Administered 2023-01-25 – 2023-03-12 (×16): 17 g via ORAL
  Filled 2023-01-23 (×19): qty 1

## 2023-01-23 MED ORDER — LACTULOSE 10 GM/15ML PO SOLN: 20.0000 g | Freq: Two times a day (BID) | ORAL | Status: DC | PRN

## 2023-01-23 MED ORDER — DIPHENHYDRAMINE HCL 50 MG/ML IJ SOLN: 50.0000 mg | Freq: Once | INTRAMUSCULAR | Status: AC

## 2023-01-23 MED ORDER — DIPHENHYDRAMINE HCL 50 MG/ML IJ SOLN
INTRAMUSCULAR | Status: AC
  Administered 2023-01-23: 50 mg via INTRAMUSCULAR
  Filled 2023-01-23: qty 1

## 2023-01-23 MED ORDER — DIAZEPAM 5 MG/ML IJ SOLN
5.0000 mg | Freq: Once | INTRAMUSCULAR | Status: AC
  Administered 2023-01-23: 5 mg via INTRAVENOUS
  Filled 2023-01-23: qty 2

## 2023-01-23 NOTE — ED Provider Notes (Addendum)
Notified by social work that they would be unable to find placement for this patient.  Family is threatening to call a local new station if the patient is not admitted at this time.  Apparently they were unable to get lab work or CT scan yesterday.  Give the patient Haldol, Benadryl, and IV Valium in order to facilitate her workup.  We were able to get labs and send them and also get a head CT.  Mother is at bedside and is understandably upset about the situation and would very much like for the patient to be admitted if at all possible.  I did let the mother know that if there is anything on her lab work that would warrant admission we will reach out to the team.  The patient is very active and in order to facilitate her workup did require restraints for a brief period of time.  Will attempt to remove them when safe.   CRITICAL CARE Performed by: Rondel Baton   Total critical care time: 30 minutes  Critical care time was exclusive of separately billable procedures and treating other patients.  Critical care was necessary to treat or prevent imminent or life-threatening deterioration.  Critical care was time spent personally by me on the following activities: development of treatment plan with patient and/or surrogate as well as nursing, discussions with consultants, evaluation of patient's response to treatment, examination of patient, obtaining history from patient or surrogate, ordering and performing treatments and interventions, ordering and review of laboratory studies, ordering and review of radiographic studies, pulse oximetry and re-evaluation of patient's condition.     Rondel Baton, MD 01/24/23 [phone]

## 2023-01-23 NOTE — ED Provider Notes (Signed)
Emergency Medicine Observation Re-evaluation Note  Natalie Brown is a 57 y.o. with hx of dementia female, seen on rounds today.  Pt initially presented to the ED for complaints of Agitation Currently, the patient is watching television.  Physical Exam  BP (!) 147/125   Resp (!) 22   Ht 5\' 6"  (1.676 m)   Wt 46 kg   BMI 16.37 kg/m  Physical Exam General: Resting comfortably in stretcher Lungs: Normal work of breathing Psych: Calm  ED Course / MDM  EKG:   I have reviewed the labs performed to date as well as medications administered while in observation.  Recent changes in the last 24 hours include patient and family refused blood work and head CT was unable to be obtained since patient is continually moving.  Medicine was consulted and left a note.  Plan  Current plan is for placement. Home meds ordered.  PT/OT consult placed.   Rondel Baton, MD 01/23/23 0900

## 2023-01-23 NOTE — ED Notes (Signed)
NAD noted at this time, pt in bed, respirations even and unlabored, skin warm and dry, bed in low position, call light within reach. Sitter with patient, Mother at bedside, pt cleaned from having BM, Comfort measures offered. Pt refusing to take anything by mouth. Will continue to monitor.

## 2023-01-23 NOTE — Evaluation (Signed)
Physical Therapy Evaluation Patient Details Name: Natalie Brown MRN: [id number] DOB: 1965-05-03 Today's Date: 01/23/2023  History of Present Illness  Pt is a 57 y.o. female who presented back to [facility] 01/22/23 after the facility she discharged to earlier that day declined her as pt became combative and facility reported they were unable to provide care for her. Of note, was admitted 12/24/21-01/22/23 before discharging to that facility. 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   Clinical Impression  Pt presents with condition above and deficits mentioned below, see PT Problem List. Evaluation limited by pt's cognitive deficits, impacting her ability to follow commands and her safety awareness. [name] impulsive to sit up, exit the bed, and try to take off out the room, often needing min-modA to maintain her balance and return her back to bed. It was unsafe to attempt to ambulate her further than a few steps away from the EOB due to her full body jerking (catatonia), impulsivity to try to sit, and inability to follow commands to maintain her safety with standing mobility. She was nonverbal this date. Suspect pt is capable of ambulating and demonstrating improved balance based on chart review from her prolonged recent admission, but she is currently limited in demonstrating this due to her cognition and impulsivity today. [name] demonstrating deficits in balance and strength that place her at risk for falls. Will trial following her acutely for PT services, but may be limited in making meaningful progress and providing skilled PT due to her deficits in cognition. Pt would benefit from going to a long-term care facility with 24/7 care. Mother appears to desire a location that can provide very close 24/7  supervision.      If plan is discharge home, recommend the following: A lot of help with walking and/or transfers;A lot of help with bathing/dressing/bathroom;Assistance with cooking/housework;Direct supervision/assist for medications management;Direct supervision/assist for financial management;Assist for transportation;Help with stairs or ramp for entrance;Supervision due to cognitive status   Can travel by private vehicle   No    Equipment Recommendations Other (comment) (defer to next venue of care)  Recommendations for Other Services       Functional Status Assessment Patient has had a recent decline in their functional status and demonstrates the ability to make significant improvements in function in a reasonable and predictable amount of time.     Precautions / Restrictions Precautions Precautions: Fall Precaution Comments: very impulsive; jerking (catatonia) Restrictions Weight Bearing Restrictions: No      Mobility  Bed Mobility Overal bed mobility: Needs Assistance Bed Mobility: Supine to Sit, Sit to Supine, Rolling Rolling: Supervision   Supine to sit: Supervision, Used rails, HOB elevated Sit to supine: Supervision, Used rails, HOB elevated   General bed mobility comments: Supervision for safety due to pt's impulsivity but pt capable of exiting and entering bed without phyiscal assistance    Transfers Overall transfer level: Needs assistance Equipment used: None, 1 person hand held assist Transfers: Sit to/from Stand Sit to Stand: Min assist, Mod assist           General transfer comment: x > 3 reps. Min-modA primarily for pt safety as she is impulsive to exit bed then quickly try to take off, appearing to assume a running position at times and needed blocking and assistance for her safety and to  keep her from impulsively sitting down where there was no chair or bed or surface to sit on. Rhythmic jerking throughout body noted (catatonia).     Ambulation/Gait Ambulation/Gait assistance: Mod assist, Min assist Gait Distance (Feet): 2 Feet (x3 bouts) Assistive device: 1 person hand held assist, None Gait Pattern/deviations: Step-through pattern, Decreased stride length, Ataxic, Knees buckling, Trunk flexed Gait velocity: reduced Gait velocity interpretation: <1.31 ft/sec, indicative of household ambulator   General Gait Details: Min-modA primarily for pt safety as she is impulsive to exit bed then quickly try to take off, appearing to assume a running position at times and needed blocking and assistance for her safety and to keep her from impulsively sitting down where there was no chair or bed or surface to sit on. Rhythmic jerking throughout body noted (catatonia). Due to difficulty safely managing pt as she was not following cues either, she was returned back to bed each bout  Stairs            Wheelchair Mobility     Tilt Bed    Modified Rankin (Stroke Patients Only)       Balance Overall balance assessment: Needs assistance Sitting-balance support: No upper extremity supported, Feet supported Sitting balance-Leahy Scale: Good     Standing balance support: Single extremity supported, No upper extremity supported, During functional activity Standing balance-Leahy Scale: Poor Standing balance comment: Pt with unstability noted, min-modA to prevent her from impulsively sitting or losing balance when standing                             Pertinent Vitals/Pain Pain Assessment Pain Assessment: Faces Faces Pain Scale: No hurt Pain Intervention(s): Monitored during session    Home Living Family/patient expects to be discharged to:: Assisted living (long-term care)                        Prior Function Prior Level of Function : Needs assist             Mobility Comments: Was able to ambulate with intermittent HHA per prior ~1 year admission ADLs Comments: Pt reliant on staff for  medication management and intermittent assistance with ADLs during ~1 year long admission recently     Extremity/Trunk Assessment   Upper Extremity Assessment Upper Extremity Assessment: Defer to OT evaluation    Lower Extremity Assessment Lower Extremity Assessment: Generalized weakness (jerking through entire body throughout (catatonia))    Cervical / Trunk Assessment Cervical / Trunk Assessment: Normal  Communication   Communication Communication: Difficulty communicating thoughts/reduced clarity of speech;Difficulty following commands/understanding Following commands: Follows one step commands inconsistently;Follows one step commands with increased time  Cognition Arousal: Alert Behavior During Therapy: Anxious, Flat affect, Impulsive Overall Cognitive Status: History of cognitive impairments - at baseline Area of Impairment: Attention, Following commands, Safety/judgement, Awareness, Problem solving                   Current Attention Level: Focused   Following Commands: Follows one step commands inconsistently, Follows one step commands with increased time Safety/Judgement: Decreased awareness of safety, Decreased awareness of deficits Awareness: Intellectual Problem Solving: Slow processing, Difficulty sequencing, Requires verbal cues, Requires tactile cues General Comments: Pt nonverbal throughout, per chart this has often been the case for the past ~1 year. Per family and staff, pt will intermittently talk at times. Does not really follow cues, more impulsive to stand and try to take off  running and needs assistance and cues to maintain her safety throughout        General Comments General comments (skin integrity, edema, etc.): mother present at end of session - enocuraged mother to look and tour facilities in the future to ensure she approves of the facilities for the pt    Exercises     Assessment/Plan    PT Assessment Patient needs continued PT services   PT Problem List Decreased strength;Decreased activity tolerance;Decreased balance;Decreased mobility;Decreased cognition;Decreased coordination;Decreased safety awareness       PT Treatment Interventions Balance training;Gait training;Functional mobility training;Therapeutic activities;Patient/family education;DME instruction;Therapeutic exercise;Neuromuscular re-education;Cognitive remediation    PT Goals (Current goals can be found in the Care Plan section)  Acute Rehab PT Goals Patient Stated Goal: none stated; mother wants pt to go to a nice facility PT Goal Formulation: With patient/family Time For Goal Achievement: 02/06/23 Potential to Achieve Goals: Good    Frequency Min 1X/week     Co-evaluation               AM-PAC PT "6 Clicks" Mobility  Outcome Measure Help needed turning from your back to your side while in a flat bed without using bedrails?: A Little Help needed moving from lying on your back to sitting on the side of a flat bed without using bedrails?: A Little Help needed moving to and from a bed to a chair (including a wheelchair)?: A Lot Help needed standing up from a chair using your arms (e.g., wheelchair or bedside chair)?: A Lot Help needed to walk in hospital room?: Total Help needed climbing 3-5 steps with a railing? : Total 6 Click Score: 12    End of Session Equipment Utilized During Treatment:  (pt pulling gait belt off each time tried to apply it) Activity Tolerance: Treatment limited secondary to agitation;Other (comment) (limited by cognition and pt not following cues) Patient left: in bed;with call bell/phone within reach;with nursing/sitter in room;with family/visitor present Nurse Communication: Mobility status (NT to sit and watch pt per NT) PT Visit Diagnosis: Other abnormalities of gait and mobility (R26.89);Unsteadiness on feet (R26.81);Difficulty in walking, not elsewhere classified (R26.2)    Time: 1478-2956 PT Time Calculation  (min) (ACUTE ONLY): 20 min   Charges:   PT Evaluation $PT Eval Moderate Complexity: 1 Mod   PT General Charges $$ ACUTE PT VISIT: 1 Visit         Virgil Benedict, PT, DPT Acute Rehabilitation Services  Office: [phone]   Bettina Gavia 01/23/2023, 11:07 AM

## 2023-01-23 NOTE — Progress Notes (Signed)
CSW spoke with patients mother Natalie Brown who is the legal guardian. Patients mother was present at bedside. CSW asked Natalie Brown what will be her plan of care for her daughter and at this time patient has no admission order to return to the medical floor. Patients mother stated she doesn't understand why and that her daughter is severely sick. Patients mother stated that she will have to report this information to channel 2 news. Patients mother stated she is aware patient cannot remain in the emergency room and was under the impression patient would be admitted. Natalie Brown stated she cannot care for patient at home because she has a one bedroom apartment and she isn't supposed to have anybody living with her. Natalie Brown stated that there is no other family that has room for patient in their home. CSW told patients mother that if patient is discharged from the emergency room that she can provide her with home aide resources. CSW also told Natalie Brown that she needs to apply for personal care services through [facility] for patient. Natalie Brown stated that her sofa is broken as well. CSW stated that patients insurance might cover a hospital bed. Natalie Brown stated she doesn't think going home will work. Patients mother stated she doesn't know what to do.   CSW contacted [facility]. CSW left a message for admissions.    CSW has notified ED director and [facility] leadership in regards to no placement options for patient at this time.

## 2023-01-23 NOTE — Progress Notes (Signed)
Patient discharged to [facility] on 01/22/23 after being on the inpatient medical floor for over a year (394 days). Patient was brought back to the hospital the same day of discharge due to staff at the facility stating they couldn't care for patient. TOC supervisor has been made aware of patients return. TOC supervisor has reached out to hospital administration in regards to patient coming back to the hospital. [facility] is awaiting a response from hospital administration in regards to the plan for patient.

## 2023-01-23 NOTE — ED Notes (Signed)
Assumed care of patient, NAD noted at this time, pt in bed, legs shaking,  respirations even and unlabored, skin warm and dry, bed in low position, call light within reach. Restraints in place, sitter at bedside, mother at bedside, Comfort measures offered. Will continue to monitor. Waiting for placement.

## 2023-01-23 NOTE — Discharge Instructions (Addendum)
Private Pay Resources  [facility] Address: [address], [place], [place] [address] Phone: [phone]  [facility] Address: [address], [place], [place] [address] Phone: [phone]  [facility] Address: [address], [place], [place] [address] Phone: [phone]  [facility] Address: [address], , [place] [address] Phone: [phone]  [facility] Address: [address], [place], [place] [address] Phone: [phone]  [facility] Phone: [phone]  [facility] Address:  [address], [place], [place] [address] Phone:  [phone]  [facility] Address:  [address] Phone:  [phone]  [email or web address]  [facility] Phone: [phone]   

## 2023-01-24 LAB — SARS CORONAVIRUS 2 BY RT PCR: SARS Coronavirus 2 by RT PCR: NEGATIVE

## 2023-01-24 MED ORDER — OLANZAPINE 2.5 MG PO TABS
2.5000 mg | ORAL_TABLET | Freq: Every day | ORAL | Status: DC
  Administered 2023-01-24 – 2023-03-12 (×42): 2.5 mg via ORAL
  Filled 2023-01-24 (×47): qty 1

## 2023-01-24 NOTE — ED Notes (Signed)
Pt's mother at bedside. Pt continues to rest with regular unlabored breathing. Safety sitter continued.

## 2023-01-24 NOTE — ED Notes (Signed)
Pt resting at this time.

## 2023-01-24 NOTE — ED Provider Notes (Signed)
Pt with hx dementia, bipolar disorder, presents after recent d/c from hospital to [facility]. Per report, upon transfer, pt with episode of anxiety/stress reaction, possibly related to acute change in environment, and ECF staff person had reservations about patients behavior/stress reaction, as so refused entry - pt brought back to ED setting as 'failed discharge' from inpatient floor unit to [facility].  As such, it is not clear why patient not promptly re-admitted to floor as pt had just left that setting/bed after a prolonged stay.  Currently, pt is boarding in ED.   Have asked TOC team to reach out to [facility] as they had accepted patient just yesterday, after prolonged stay, and patients condition and behavior is very much stable, and consistent with what they would have reviewed prior to accepting patient to their facility.  Pts vitals are normal, she is eating/drinking, and her mental status/exam appear c/w recent baseline.  Pt does appear appropriate for hospital d/c to ECF - TOC to reach out to facility that had accepted pt.   Discussed meds with pt/mother.  Mother indicates feels low dose zyprexa would be ok and potentially beneficial - indicating pt did not seem to do as well when on higher  dose of haldol or similar medications.        Cathren Laine, MD 01/24/23 1501

## 2023-01-24 NOTE — ED Notes (Signed)
Pt asleep in room with soft non-violent restraints on. Resps even and unlabored. Bonne Dolores safety sitting within view of pt. Placed on continuous pulse ox and blood pressure monitoring. VSS.

## 2023-01-24 NOTE — Evaluation (Signed)
Occupational Therapy Evaluation Patient Details Name: Natalie Brown MRN: [id number] DOB: 1965-05-15 Today's Date: 01/24/2023   History of Present Illness Pt is a 57 y.o. female who presented back to [facility] 01/22/23 after the facility she discharged to earlier that day declined her as pt became combative and facility reported they were unable to provide care for her. Of note, was admitted 12/24/21-01/22/23 before discharging to that facility. Admitted to [facility] on 09/07/2021 for catatonia treatment, arrived from [facility] on 12/24/21 for tachycardia, covid + 01/12/22, Fall on 08/26/2022. 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   Clinical Impression   This 57 yo female admitted with above presents to acute OT with PLOF of being able to take care of herself per mother and pt intermittently living on the streets. Currently pt is able to feed herself once set up, but cannot get her to follow commands for any other tasks. She will continue to benefit from acute OT with follow up at long term SNF/ALF/BHC. We will continue to follow.       If plan is discharge home, recommend the following: Two people to help with walking and/or transfers;A lot of help with bathing/dressing/bathroom;Assistance with cooking/housework;Direct supervision/assist for medications management;Help with stairs or ramp for entrance;Assist for transportation;Direct supervision/assist for financial management    Functional Status Assessment  Patient has had a recent decline in their functional status and demonstrates the ability to make significant improvements in function in a reasonable and predictable amount of time.  Equipment Recommendations  Other (comment) (TBD next venue)       Precautions / Restrictions Precautions Precautions: Fall Precaution Comments: very impulsive Restrictions Weight Bearing Restrictions: No      Mobility Bed Mobility                General bed mobility comments: Pt moving all about in the stretcher in ED as much as she can with Bil UEs restrained    Transfers                   General transfer comment: Did not attempt with 1 person A due to how she did with PT yesterday      Balance Overall balance assessment: Needs assistance                                         ADL either performed or assessed with clinical judgement   ADL Overall ADL's : Needs assistance/impaired Eating/Feeding: Supervision/ safety;Set up;Bed level   Grooming: Total assistance Grooming Details (indicate cue type and reason): handed pt a washcloht and asked her to wash her face; she did not attempt (of course it perhaps did not help when her mom stated, oh she won't do that" Upper Body Bathing: Total assistance   Lower Body Bathing: Total assistance   Upper Body Dressing : Total assistance   Lower Body Dressing: Total assistance   Toilet Transfer: Total assistance   Toileting- Clothing Manipulation and Hygiene: Total assistance               Vision   Additional Comments: Pt only appears to intermittently focus on any task            Pertinent Vitals/Pain Pain Assessment Pain Assessment: Faces Faces Pain Scale: No hurt     Extremity/Trunk Assessment Upper Extremity Assessment Upper Extremity Assessment: Overall [facility]  for tasks assessed (based on spontaneous movements)           Communication Communication Communication: Difficulty communicating thoughts/reduced clarity of speech;Difficulty following commands/understanding Cueing Techniques: Gestural cues;Tactile cues;Visual cues;Verbal cues   Cognition Arousal: Alert Behavior During Therapy: Flat affect, Impulsive Overall Cognitive Status: History of cognitive impairments - at baseline Area of Impairment: Attention, Following commands, Safety/judgement, Awareness, Problem solving                   Current Attention Level:  Focused   Following Commands:  (not following any one step commands) Safety/Judgement: Decreased awareness of safety, Decreased awareness of deficits Awareness: Intellectual Problem Solving: Slow processing, Difficulty sequencing, Requires verbal cues, Requires tactile cues, Decreased initiation General Comments: Pt nonverbal throughout, per chart this has often been the case for the past ~1 year. Per family and staff, pt will intermittently talk at times. Does not really follow cues. Pt completely focused on eating throughout trying to work with her.                Home Living Family/patient expects to be discharged to:: Assisted living Living Arrangements: Alone                                      Prior Functioning/Environment Prior Level of Function : Needs assist             Mobility Comments: Was able to ambulate with intermittent HHA per prior ~1 year admission; she was recently hosptialized here and per RN she was up and taking her self to the bathroom, eating, and do her ADLs all on her own ADLs Comments: Pt reliant on staff for medication management and intermittent assistance with ADLs during ~1 year long admission recently        OT Problem List: Impaired balance (sitting and/or standing);Decreased cognition;Decreased safety awareness      OT Treatment/Interventions: Self-care/ADL training;DME and/or AE instruction;Patient/family education;Balance training    OT Goals(Current goals can be found in the care plan section) Acute Rehab OT Goals OT Goal Formulation: Patient unable to participate in goal setting Time For Goal Achievement: 02/07/23 Potential to Achieve Goals: Fair  OT Frequency: Min 1X/week       AM-PAC OT "6 Clicks" Daily Activity     Outcome Measure Help from another person eating meals?: A Little Help from another person taking care of personal grooming?: Total Help from another person toileting, which includes using toliet,  bedpan, or urinal?: Total Help from another person bathing (including washing, rinsing, drying)?: Total Help from another person to put on and taking off regular upper body clothing?: Total Help from another person to put on and taking off regular lower body clothing?: Total 6 Click Score: 8   End of Session Nurse Communication: Mobility status  Activity Tolerance: Treatment limited secondary to agitation (lack of follow commands) Patient left: in bed;with nursing/sitter in room;with family/visitor present;with restraints reapplied  OT Visit Diagnosis: Other abnormalities of gait and mobility (R26.89);Other symptoms and signs involving the nervous system (R29.898);Other symptoms and signs involving cognitive function;Adult, failure to thrive (R62.7)                Time: 7829-5621 OT Time Calculation (min): 18 min Charges:  OT General Charges $OT Visit: 1 Visit OT Evaluation $OT Eval Moderate Complexity: 1 Mod  Cathy L. OT Acute Rehabilitation Services Office [phone]    Reina Fuse  Carley Hammed 01/24/2023, 3:49 PM

## 2023-01-24 NOTE — ED Notes (Signed)
Pt in bilateral soft wrist restraints upon [place] to [place]

## 2023-01-24 NOTE — Progress Notes (Signed)
TOC leadership is awaiting a response from hospital administration in regards to a plan for patient.

## 2023-01-24 NOTE — ED Notes (Signed)
Pt BP was obtained while pt lying on her L side. Pt not repositioned as to not wake pt up. Pt in NAD.

## 2023-01-24 NOTE — ED Notes (Signed)
Pt in bed, trying to out of restraints at this time.

## 2023-01-24 NOTE — ED Provider Notes (Signed)
Emergency Medicine Observation Re-evaluation Note  Natalie Brown is a 57 y.o. female, seen on rounds today.  Pt initially presented to the ED for complaints of Agitation Currently, the patient is asleep in bed.  Physical Exam  BP 112/72   Pulse 76   Temp 97.6 F (36.4 C) (Axillary)   Resp 16   Ht 5\' 6"  (1.676 m)   Wt 46 kg   SpO2 100%   BMI 16.37 kg/m  Physical Exam General: Asleep, no acute distress Cardiac: Regular rate Lungs: No increased work of breathing Psych: Calm, sleep  ED Course / MDM  EKG:   I have reviewed the labs performed to date as well as medications administered while in observation.  Recent changes in the last 24 hours include patient medically cleared without reason to require readmission.  [name] being evaluated for social work for appropriate disposition.  Plan  Current plan is for social work for disposition.    Elayne Snare K, DO 01/24/23 1447

## 2023-01-24 NOTE — ED Notes (Signed)
Pt more awake. Ate lunch with assistance of OT and sitter.

## 2023-01-25 NOTE — ED Provider Notes (Addendum)
Emergency Medicine Observation Re-evaluation Note  Natalie Brown is a 57 y.o. female, seen on rounds today.  Pt initially presented to the ED for complaints of Agitation Currently, the patient is resting comfortably in bed, family bedside.  57 year old female who has a history significant for bipolar disorder, dementia presenting back to the emergency department on 01/22/2023 after the facility she was discharged to earlier today declined as the patient became combative in the facility stated they were unable to care for her.  The patient was recently admitted from 12/24/2021 to 01/22/2023 before discharge. Per notes, "Admitted to [facility] on 09/07/2021 for catatonia treatment, arrived from [facility] on 12/24/21 for tachycardia, covid + 01/12/22, Fall on 08/26/2022. PMH of Bipolar affective Disorder and chronic Dementia with Advanced Cerebral Atrophy and Behavioral Disturbances."  Per psychiatry consult note 9/30 prior to her recent DC: "This patient has had an unfortunate, rapid decline over the past several months to a year. She was initially admitted to the behavioral health hospital after decompensating with hx bipolar I disorder (was sleeping outside in the rain, confused, issues remaining on task) in 08/2021. Her psychosis and mania stabilized after about 4 weeks of admission w/ multiple medication trials (Zyprexa, haldol, and Invega). This admission revealed a significant and rapidly progressive dementia. She had a prolonged admission to behavioral health due to no safe discharge options and was transferred to the hospital in 12/2021 after 4 months after developing delirium/encephalopathy while admitted to [facility]. She has been admitted to [facility] since 12/2021 (now almost 1 year) and over that time has become almost totally nonverbal however has not been overtly manic or psychotic during that time span; dementia dx and sx has largely superceded these clinical features. Psychiatry consult team has been involved  intermittently in her care over the last year (saw at admission 12/2021 when [facility] regimen continued, in 06/2022 (tapered off of haloperidol d/t rigidity/dystonia - attempted to transition to paliperidone however mother/guardian did not pick up the phone)) and was re consulted by Dr. Mahala Menghini due to repetitive behaviors (rocking back and forth). Her family is mostly concerned that these behaviors are preventing her from eating and will lead to hastened death.  She had a remarkable response to low-dose ativan (began talking, eating, now walking again) and is now significantly improved after dose increase/optimization, although mostly responding to family > hospital staff (as is expected in pt with advanced dementia). Seemed to regress w/ increase of olanzapine (had been started by primary team) which was taken as high as 10 mg/day before being tapered off."     Physical Exam  BP (!) 93/52 (BP Location: Right Arm) Comment: Patient resting on left side  Pulse 89   Temp 98.5 F (36.9 C) (Oral)   Resp 17   Ht 5\' 6"  (1.676 m)   Wt 46 kg   SpO2 99%   BMI 16.37 kg/m  Physical Exam General: NAD Cardiac: well perfused Lungs: breathing unlabored, lungs clear Psych: not agitated, rocking back and forth in bed, nonverbal  ED Course / MDM  EKG:   I have reviewed the labs performed to date as well as medications administered while in observation.  Recent changes in the last 24 hours include: evaluated by OT yesterday, recommending SNF.  Plan  Current plan is for [facility] for placement.    Ernie Avena, MD 01/25/23 1152    Ernie Avena, MD 01/25/23 1155

## 2023-01-25 NOTE — ED Notes (Signed)
Pt quietly resting in bed with eyes closed; respirations even and unlabored with no distress noted.

## 2023-01-25 NOTE — ED Notes (Signed)
Pt is resting. Breakfast is at bedside.

## 2023-01-25 NOTE — ED Notes (Signed)
Pt has no sitter currently and per staffing, no one to sit with pt throughout evening.

## 2023-01-25 NOTE — ED Notes (Signed)
Pt received for care at 1900.  Pt being medicated by day shift nurse following report at this time.  Pt somewhat restless in bed.  Bed in lowest position, wheels locked.

## 2023-01-26 NOTE — ED Notes (Signed)
Pt soiled with stool on sheets, legs, and in brief upon being checked.  This RN and both techs on unit attempting to change pt.  Pt trying to sit up and flailing arms/legs during changing.  Pt transitioned to squatting and jumped off bed while staff getting supplies together.  Pt assisted back into bed.  Pt changed by this RN and staff.  New brief placed on pt and linens changed.

## 2023-01-26 NOTE — ED Notes (Signed)
Vitals obtained while pt sleeping on left side.  Pt allowed to continue resting at this time.

## 2023-01-26 NOTE — ED Notes (Signed)
Pt continues to not have regular sitter at this time.  This RN rounding on pt frequently to ensure pt safety.

## 2023-01-26 NOTE — ED Notes (Signed)
Pt not agreeable to eating breakfast at this time. Will attempt when pt is calmer.

## 2023-01-26 NOTE — ED Notes (Addendum)
Pt soiled, staff assisting w/ cleaning up pt. Pt fighting staff. PRN for agitation to be given so staff can care for pt.

## 2023-01-26 NOTE — ED Notes (Signed)
Pt cleaned up, full linen change and shirt changed out for gown d/t stool on bed, pt and clothing. 4 people had to be utilized to change and bathe pt. Pt keeps attempting to get out of bed. Soft wrist nonviolent restraints initiated for pt safety. No sitter at bedside, per staffing, "no one has signed up". Bed alarm implemented as well.

## 2023-01-26 NOTE — ED Notes (Signed)
Pt's brief to be checked when security returns to [facility] [place], allowing this RN to step away from nurse station.  At current time, this RN unable to step away from station due to behavior of another pt requiring constant monitoring.  Changing this pt will likely require this RN and both techs in zone, leaving no one to monitor other patients while staff in room changing this pt.

## 2023-01-26 NOTE — ED Provider Notes (Signed)
Emergency Medicine Observation Re-evaluation Note  Natalie Brown is a 57 y.o. female, seen on rounds today.  Pt initially presented to the ED for complaints of Agitation Currently, the patient is resting in bed.    Physical Exam  BP (!) 91/58 (BP Location: Right Arm) Comment: Pt lying on left side  Pulse 70   Temp 98.7 F (37.1 C) (Oral)   Resp 15   Ht 5\' 6"  (1.676 m)   Wt 46 kg   SpO2 100%   BMI 16.37 kg/m  Physical Exam General: NAD   ED Course / MDM  EKG:   I have reviewed the labs performed to date as well as medications administered while in observation.  Recent changes in the last 24 hours include: None  Plan  Current plan is for [facility] for placement.        Ernie Avena, MD 01/26/23 1209

## 2023-01-27 NOTE — ED Notes (Signed)
Responded to pts bed alarm. Pt had moved to end of bed with feet nearly touching floor and was pulling at restraints. Pt redirected and assisted back to bed. Restraints remain in proper position. No needs identified at this time. Brief dry and intact.

## 2023-01-27 NOTE — ED Notes (Signed)
Changed pt's linens and brief. Pt had stool and urine incontinence.

## 2023-01-27 NOTE — ED Notes (Signed)
Pt still not following directions. Non-verbal. Ate only a small amt of breakfast that was fed to her by this RN.

## 2023-01-27 NOTE — Progress Notes (Signed)
TOC leadership is aware of patient. CSW is waiting for a response in regards to a plan for patient. [facility] May of [facility]23 and recently discharged on 01/22/23 and came back the same day due to the facility stating they couldn't care for patient. CSW has contacted [facility] and no one has returned any of CSW's calls. CSW was last transferred to a nurses line and the voicemail was full. The administrator has not returned CSW's voicemail.

## 2023-01-27 NOTE — ED Notes (Signed)
Pt's significant other is visiting and is saying he wants to talk to someone about the pt's condition. He states the pt went from a fully functional person with bipolar, to this shell of her old self that doesn't talk and often just stares at the wall. There is no interaction with anyone anymore. He is also stating that he has tried to contact pt experience and no one calls him back. Spoke to [employer], Consulting civil engineer and she suggested risk management. I called them and left a message to call him and gave them his number or me until 1900 tonight.

## 2023-01-27 NOTE — ED Provider Notes (Signed)
  Physical Exam  BP 97/66 (BP Location: Right Arm)   Pulse 71   Temp 98 F (36.7 C) (Axillary)   Resp 20   Ht 5\' 6"  (1.676 m)   Wt 46 kg   SpO2 93%   BMI 16.37 kg/m   Physical Exam  Procedures  Procedures  ED Course / MDM    Medical Decision Making Amount and/or Complexity of Data Reviewed Labs: ordered. Radiology: ordered.  Risk Prescription drug management.   Patient is pending nursing home placement.  Recently got out of a year long stay in [facility].  Reportedly went and the facility she went to said they could not handle her.  Brought back here.  Has now been in the ER for around 5 days.  Pending placement.    I have renewed restraint order and renewed sitter order.      Benjiman Core, MD 01/27/23 [phone]

## 2023-01-27 NOTE — ED Notes (Signed)
Pt has been calm with family at beside.

## 2023-01-28 NOTE — ED Provider Notes (Signed)
Emergency Medicine Observation Re-evaluation Note  Natalie Brown is a 57 y.o. female, seen on rounds today.  Pt initially presented to the ED for complaints of Agitation Currently, the patient is lying in bed.  Physical Exam  BP (!) 122/99 (BP Location: Left Arm)   Pulse (!) 116   Temp 98.7 F (37.1 C) (Oral)   Resp 20   Ht 5\' 6"  (1.676 m)   Wt 46 kg   SpO2 94%   BMI 16.37 kg/m  Physical Exam General: Lying in bed awake, resting Cardiac: well perfused Lungs: no resp distress Psych: calm, resting  ED Course / MDM  EKG:   I have reviewed the labs performed to date as well as medications administered while in observation.  Recent changes in the last 24 hours include none.  Plan  Current plan is for pending placement.    [name], MD 01/28/23 [phone]

## 2023-01-28 NOTE — ED Notes (Signed)
ED Provider at bedside. 

## 2023-01-28 NOTE — ED Notes (Signed)
Patient was fed breakfast.

## 2023-01-28 NOTE — ED Notes (Signed)
Patient was fed supper

## 2023-01-28 NOTE — Progress Notes (Signed)
Physical Therapy Treatment Patient Details Name: Natalie Brown MRN: [id number] DOB: 04-Apr-1966 Today's Date: 01/28/2023   History of Present Illness Pt is a 57 y.o. female who presented back to [facility] 01/22/23 after the facility she discharged to earlier that day declined her as pt became combative and facility reported they were unable to provide care for her. Of note, was admitted 12/24/21-01/22/23 before discharging to that facility. 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    PT Comments  Pt found pulling against restraints trying to get at the milkshake her Mom was making.  Used the milkshake as incentive to mobilize.  Cued and physically encouraged the patient to move toward EOB, stand, work on balance/pregait while drinking the shake.  Progressed to heavily assisted gait and afterward, pt was more agitated and needed to return to bed with restraints reapplied.     If plan is discharge home, recommend the following: A lot of help with walking and/or transfers;A lot of help with bathing/dressing/bathroom;Assistance with cooking/housework;Direct supervision/assist for medications management;Direct supervision/assist for financial management;Assist for transportation;Help with stairs or ramp for entrance;Supervision due to cognitive status   Can travel by private vehicle     No  Equipment Recommendations   (TBD\)    Recommendations for Other Services       Precautions / Restrictions Precautions Precautions: Fall Precaution Comments: very impulsive     Mobility  Bed Mobility Overal bed mobility: Needs Assistance Bed Mobility: Supine to Sit, Sit to Supine     Supine to sit: Supervision Sit to supine: Supervision   General bed mobility comments: pt can move around the bed at  will, but not appropriately to commands    Transfers Overall transfer level: Needs assistance Equipment used: None Transfers: Sit to/from Stand Sit to Stand: Mod assist Stand pivot transfers: Mod assist         General transfer comment: pt able to stand up with less than mod assist, but generally needs mod assist to control for unsafe behaviors.    Ambulation/Gait Ambulation/Gait assistance: Mod assist, Max assist Gait Distance (Feet): 30 Feet (x2 with significant body against body mod/max assist)   Gait Pattern/deviations: Step-through pattern, Scissoring Gait velocity: reduced Gait velocity interpretation: 1.31 - 2.62 ft/sec, indicative of limited community ambulator   General Gait Details: Initially pt just standing on L LE (stork-like), but with tactile cues from Mom, pt stood on 2 legs.  Gait generally unsteady with legs trailing behind the trunk.  Multimodal cues to keep pt moving and safe.  +2 not available, but necessary for optimal safety   Stairs             Wheelchair Mobility     Tilt Bed    Modified Rankin (Stroke Patients Only)       Balance Overall balance assessment: Needs assistance Sitting-balance support: No upper extremity supported   Sitting balance - Comments: pt is not able to focus on therapist or is intentionally not following direction to sit at EOB  to drink a strawberry shake withut holding mod assist.                                    Cognition Arousal: Alert Behavior During Therapy: Flat affect, Impulsive Overall Cognitive Status:  History of cognitive impairments - at baseline                   Orientation Level: Person Current Attention Level: Focused Memory: Decreased short-term memory, Decreased recall of precautions Following Commands: Follows one step commands inconsistently Safety/Judgement: Decreased awareness of safety, Decreased awareness of deficits Awareness: Intellectual Problem Solving:  Slow processing, Difficulty sequencing, Requires verbal cues, Requires tactile cues, Decreased initiation General Comments: Pt nonverbal throughout, per chart this has often been the case for the past ~1 year. Per family and staff, pt will intermittently talk at times. Does not really follow cues. Pt completely focused on eating throughout trying to work with her.        Exercises      General Comments        Pertinent Vitals/Pain Pain Assessment Pain Assessment: Faces Faces Pain Scale: No hurt Pain Intervention(s): Monitored during session    Home Living Family/patient expects to be discharged to:: Other (Comment) (Mom is looking for them a residence, because pt will be hard to place in a SNF)                        Prior Function            PT Goals (current goals can now be found in the care plan section) Acute Rehab PT Goals PT Goal Formulation: With patient/family Time For Goal Achievement: 02/06/23 Potential to Achieve Goals: Good Progress towards PT goals: Progressing toward goals    Frequency    Min 1X/week      PT Plan Current plan remains appropriate    Co-evaluation              AM-PAC PT "6 Clicks" Mobility   Outcome Measure  Help needed turning from your back to your side while in a flat bed without using bedrails?: A Little Help needed moving from lying on your back to sitting on the side of a flat bed without using bedrails?: A Little Help needed moving to and from a bed to a chair (including a wheelchair)?: A Lot Help needed standing up from a chair using your arms (e.g., wheelchair or bedside chair)?: A Lot Help needed to walk in hospital room?: Total Help needed climbing 3-5 steps with a railing? : Total 6 Click Score: 12    End of Session   Activity Tolerance: Patient tolerated treatment well Patient left: in bed;with call bell/phone within reach;with family/visitor present;Other (comment) (wrist restaints for safety) Nurse  Communication: Mobility status PT Visit Diagnosis: Other abnormalities of gait and mobility (R26.89);Unsteadiness on feet (R26.81);Difficulty in walking, not elsewhere classified (R26.2)     Time: 7829-5621 PT Time Calculation (min) (ACUTE ONLY): 18 min  Charges:    $Gait Training: 8-22 mins PT General Charges $$ ACUTE PT VISIT: 1 Visit                     01/28/2023  Jacinto Halim., PT Acute Rehabilitation Services [phone]  (office)   Eliseo Gum [place] 01/28/2023, 5:07 PM

## 2023-01-28 NOTE — Progress Notes (Signed)
CSW contacted patients mother, [name] who is also patients legal guardian. Patients mother stated she has been trying to find a 2 bedroom apartment for herself and patient but hasn't had much luck. CSW told patients mother that the hospital would like to have a family meeting to discuss her plan of care for her daughter. Patients mother stated she could come to the hospital today in a few hours. CSW reached out to [facility] supervisor to find out when hospital administration can meet with patients mother. CSW is currently waiting for a response back from hospital administration.

## 2023-01-29 NOTE — ED Provider Notes (Signed)
Emergency Medicine Observation Re-evaluation Note  Natalie Brown is a 57 y.o. female, seen on rounds today.  Pt initially presented to the ED for complaints of Agitation Currently, the patient is sleeping comfortably in her exam bed.  Physical Exam  BP 102/66 (BP Location: Left Arm)   Pulse 64   Temp 97.6 F (36.4 C) (Oral)   Resp 20   Ht 5\' 6"  (1.676 m)   Wt 46 kg   SpO2 100%   BMI 16.37 kg/m  Physical Exam General: Sleeping and resting without acute agitation at this time Cardiac: Not cardiac on last vital sign report Lungs: Symmetric rise and fall of chest without acute respiratory distress Psych: No agitation  ED Course / MDM  EKG:   I have reviewed the labs performed to date as well as medications administered while in observation.  Recent changes in the last 24 hours include none reported by nursing.  Plan  Current plan is for awaiting placement.    Belton Peplinski, Canary Brim, MD 01/29/23 [phone]

## 2023-01-29 NOTE — ED Notes (Signed)
No sitter currently available for patient; EDP made aware of no sitter; NV restraints not renewed at this time; pt remains a high fall risk; Bed alarm placed; Pt vitals WNL-Monique,RN

## 2023-01-29 NOTE — Progress Notes (Signed)
OT Cancellation Note  Patient Details Name: Natalie Brown MRN: [id number] DOB: August 17, 1965   Cancelled Treatment:    Reason Eval/Treat Not Completed: Other (comment) (Patient with increased agitation, fighting staff, in retraints) Alfonse Flavors, OTA Acute Rehabilitation Services  Office [phone]  Dewain Penning 01/29/2023, 2:09 PM

## 2023-01-29 NOTE — ED Notes (Signed)
Pt not in restraints upon taking over pt care 

## 2023-01-29 NOTE — ED Notes (Signed)
Pt w/ multiple attempts to try to get out of bed and intermittently fighting staff. Pt unable to follow commands or safe limits.

## 2023-01-29 NOTE — Progress Notes (Signed)
Per chart review, there will be a family meeting on Thursday, 01/30/23 at 12pm.

## 2023-01-29 NOTE — ED Notes (Signed)
Bilateral soft wrist restraints removed at this time.  Patient asleep.  Did not wake with light touch and voice.  Pt remained asleep after restraints removed.

## 2023-01-30 NOTE — ED Notes (Signed)
Messaged pharmacy to send ensure

## 2023-01-30 NOTE — Progress Notes (Signed)
Physical Therapy Treatment Patient Details Name: Natalie Brown MRN: [id number] DOB: 04/19/1966 Today's Date: 01/30/2023   History of Present Illness Pt is a 57 y.o. female who presented back to [facility] 01/22/23 after the facility she discharged to earlier that day declined her as pt became combative and facility reported they were unable to provide care for her. Of note, was admitted 12/24/21-01/22/23 before discharging to that facility. Admitted to [facility] on 09/07/2021 for catatonia treatment, arrived from [facility] on 12/24/21 for tachycardia, covid + 01/12/22, Fall on 08/26/2022. 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    PT Comments  Pt is not progressing well towards goals. Pt has good strength but continues to be unable to follow directions for skilled physical therapy intervention. If pt continues to be unable to follow directions for skilled intervention pt may need to be discharged from skilled physical therapy services due to inability of pt to participate. Pt is able to perform bed mobility, sit to stand and try to run towards the door to room at Min A to prevent falls. Intermittently Max A +2 in standing and gait due to sporadic, impulsive behaviors and inability to follow instruction.  Due to pt current functional status, home set up and available assistance at home recommending skilled physical therapy services < 3 hours/day pending cognitive progress.    If plan is discharge home, recommend the following: A lot of help with walking and/or transfers;Assistance with cooking/housework;Assist for transportation;Help with stairs or ramp for entrance;Supervision due to cognitive status   Can travel by private vehicle     No  Equipment Recommendations  Other (comment) (Defer to post acute)       Precautions / Restrictions Precautions Precautions: Fall Precaution Comments: very impulsive Restrictions Weight Bearing Restrictions:  No     Mobility  Bed Mobility Overal bed mobility: Needs Assistance Bed Mobility: Supine to Sit, Sit to Supine     Supine to sit: Supervision, Min assist Sit to supine: Supervision, Min assist   General bed mobility comments: patient able to get to EOB and back to supine with supervision but required assistance to initate at times    Transfers Overall transfer level: Needs assistance Equipment used: 2 person hand held assist Transfers: Sit to/from Stand Sit to Stand: Mod assist, +2 physical assistance           General transfer comment: patient immediately standing from EOB and attempted to walk to door to leave, required assistance of 2 to stabilize    Ambulation/Gait Ambulation/Gait assistance: Supervision, Max assist, +2 physical assistance Gait Distance (Feet): 15 Feet Assistive device: None Gait Pattern/deviations: Step-through pattern, Scissoring Gait velocity: Pt has quick movements Gait velocity interpretation: >4.37 ft/sec, indicative of normal walking speed   General Gait Details: Posterior COM over BOS, impulsive, multi modal cues, random movements and directions as well as randomly leaning posteriorly, grabbing therapist losing balance. Unable to follow directions.   Stairs Stairs:  (unable at this time for safety reasons)               Balance Overall balance assessment: Needs assistance Sitting-balance support: No upper extremity supported Sitting balance-Leahy Scale: Good Sitting balance - Comments: able to sit on EOB without assistance but limited due to impulsiveness   Standing balance support: Bilateral upper extremity supported Standing balance-Leahy Scale: Poor Standing balance comment: unstable when standing, increased fall risk due to difficulty following directions       Cognition Arousal: Alert  Behavior During Therapy: Flat affect, Impulsive Overall Cognitive Status: History of cognitive impairments - at baseline Area of  Impairment: Following commands, Safety/judgement     Orientation Level: Person     Following Commands: Follows one step commands inconsistently Safety/Judgement: Decreased awareness of safety, Decreased awareness of deficits   Problem Solving: Requires verbal cues, Requires tactile cues General Comments: Non-verbal throught session, attempting to leave room           General Comments General comments (skin integrity, edema, etc.): Pt in wrist restraints at beginning and end of session today.      Pertinent Vitals/Pain Pain Assessment Pain Assessment: Faces Faces Pain Scale: No hurt Breathing: normal Negative Vocalization: none Facial Expression: smiling or inexpressive Body Language: relaxed Consolability: no need to console PAINAD Score: 0     PT Goals (current goals can now be found in the care plan section) Acute Rehab PT Goals Patient Stated Goal: none stated; mother wants pt to go to a nice facility PT Goal Formulation: With patient/family Time For Goal Achievement: 02/06/23 Potential to Achieve Goals: Good Progress towards PT goals: Not progressing toward goals - comment (cognitively pt is unable to follow directions)    Frequency    Min 1X/week      PT Plan Current plan remains appropriate    Co-evaluation PT/OT/SLP Co-Evaluation/Treatment: Yes Reason for Co-Treatment: Complexity of the patient's impairments (multi-system involvement);Necessary to address cognition/behavior during functional activity;To address functional/ADL transfers PT goals addressed during session: Mobility/safety with mobility OT goals addressed during session: ADL's and self-care      AM-PAC PT "6 Clicks" Mobility   Outcome Measure  Help needed turning from your back to your side while in a flat bed without using bedrails?: A Little Help needed moving from lying on your back to sitting on the side of a flat bed without using bedrails?: A Little Help needed moving to and from  a bed to a chair (including a wheelchair)?: A Little Help needed standing up from a chair using your arms (e.g., wheelchair or bedside chair)?: A Little Help needed to walk in hospital room?: A Lot Help needed climbing 3-5 steps with a railing? : A Lot 6 Click Score: 16    End of Session   Activity Tolerance: Treatment limited secondary to agitation Patient left: in bed;with nursing/sitter in room;with restraints reapplied Nurse Communication: Mobility status PT Visit Diagnosis: Other abnormalities of gait and mobility (R26.89);Unsteadiness on feet (R26.81);Difficulty in walking, not elsewhere classified (R26.2)     Time: 1015-1027 PT Time Calculation (min) (ACUTE ONLY): 12 min  Charges:    $Therapeutic Activity:  (No charge due to Co-tx w/OT and OT charged for Ther-Act.) PT General Charges $$ ACUTE PT VISIT: 1 Visit                     Harrel Carina, DPT, CLT  Acute Rehabilitation Services Office: [phone] (Secure chat preferred)    Claudia Desanctis 01/30/2023, 12:47 PM

## 2023-01-30 NOTE — ED Provider Notes (Signed)
Emergency Medicine Observation Re-evaluation Note  Natalie Brown is a 57 y.o. female, seen on rounds today.  Pt initially presented to the ED for complaints of Agitation Currently, the patient is sleeping.  Physical Exam  BP 98/68 (BP Location: Left Arm)   Pulse 85   Temp 98.2 F (36.8 C) (Axillary)   Resp 17   Ht 5\' 6"  (1.676 m)   Wt 46 kg   SpO2 100%   BMI 16.37 kg/m  Physical Exam General: NAD Lungs: no resp distress Psych: not agitated  ED Course / MDM  EKG:   I have reviewed the labs performed to date as well as medications administered while in observation.  Recent changes in the last 24 hours include family meeting planned for today, intermittent use of restraints, intermittent agitation last 24 hrs  Plan  Current plan is for placement .    Sloan Leiter, DO 01/30/23 [phone]

## 2023-01-30 NOTE — ED Notes (Signed)
Patient not in restraints at time of assuming care.

## 2023-01-30 NOTE — ED Notes (Signed)
Assumed care of pt, mom currently in room with pt. Pt calm at this time.

## 2023-01-30 NOTE — Progress Notes (Signed)
POccupational Therapy Treatment Patient Details Name: Natalie Brown MRN: [id number] DOB: 16-Jul-1965 Today's Date: 01/30/2023   History of present illness Pt is a 57 y.o. female who presented back to [facility] 01/22/23 after the facility she discharged to earlier that day declined her as pt became combative and facility reported they were unable to provide care for her. Of note, was admitted 12/24/21-01/22/23 before discharging to that facility. 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   OT comments  Patient seen with PT due to cognition and behavior.  Patient's wrist restraints removed and patient instructed on getting to EOB and patient resistance to getting up and attempting to cover self with blanket. Patient provided with pants to allow he to be covered with assistance to thread legs into clothing and patient bridging to donn with assistance to complete. Patient able to get to EOB and immediately attempting to walk to door without assistance and demonstrating unsafe balance and was assisted back to EOB with +2 assist. A second attempt was make with 2 person HHA with patient continue to be resistant to care and demonstrating unsafe standing balance. Patient assisted back to supine and restraints reapplied. Acute OT to continue to follow.       If plan is discharge home, recommend the following:  Two people to help with walking and/or transfers;A lot of help with bathing/dressing/bathroom;Assistance with cooking/housework;Direct supervision/assist for medications management;Help with stairs or ramp for entrance;Assist for transportation;Direct supervision/assist for financial management   Equipment Recommendations  Other (comment) (TBD next venue of care)    Recommendations for Other Services       Precautions / Restrictions Precautions Precautions: Fall Precaution Comments: very impulsive Restrictions Weight Bearing Restrictions: No       Mobility Bed Mobility Overal bed mobility: Needs Assistance Bed Mobility: Supine to Sit, Sit to Supine     Supine to sit: Supervision, Min assist Sit to supine: Supervision, Min assist   General bed mobility comments: patient able to get to EOB and back to supine with supervision but required assistance to initate at times    Transfers Overall transfer level: Needs assistance Equipment used: 2 person hand held assist Transfers: Sit to/from Stand Sit to Stand: Mod assist, +2 physical assistance           General transfer comment: patient immediately standing from EOB and attempted to walk to door to leave, required assistance of 2 to stabilize     Balance Overall balance assessment: Needs assistance Sitting-balance support: No upper extremity supported Sitting balance-Leahy Scale: Good Sitting balance - Comments: able to sit on EOB without assistance but limited due to impulsiveness   Standing balance support: Bilateral upper extremity supported Standing balance-Leahy Scale: Poor Standing balance comment: unstable when standing, increased fall risk due to difficulty following directions                           ADL either performed or assessed with clinical judgement   ADL Overall ADL's : Needs assistance/impaired                     Lower Body Dressing: Moderate assistance;Maximal assistance Lower Body Dressing Details (indicate cue type and reason): assisted with threading legs throught pants, patient bridging to pull up with assistance to complete  General ADL Comments: difficulty following directions with LB dressing    Extremity/Trunk Assessment              Vision       Perception     Praxis      Cognition Arousal: Alert Behavior During Therapy: Flat affect,  Impulsive Overall Cognitive Status: History of cognitive impairments - at baseline Area of Impairment: Attention, Following commands, Safety/judgement, Awareness, Problem solving                 Orientation Level: Person Current Attention Level: Focused Memory: Decreased short-term memory, Decreased recall of precautions Following Commands: Follows one step commands inconsistently Safety/Judgement: Decreased awareness of safety, Decreased awareness of deficits Awareness: Intellectual Problem Solving: Slow processing, Difficulty sequencing, Requires verbal cues, Requires tactile cues, Decreased initiation General Comments: Non-verbal throught session, attempting to leave room        Exercises      Shoulder Instructions       General Comments Patient in wrist restraints at beginning and end of session    Pertinent Vitals/ Pain       Pain Assessment Pain Assessment: Faces Faces Pain Scale: No hurt  Home Living                                          Prior Functioning/Environment              Frequency  Min 1X/week        Progress Toward Goals  OT Goals(current goals can now be found in the care plan section)  Progress towards OT goals: Not progressing toward goals - comment (difficulty following directions)  Acute Rehab OT Goals OT Goal Formulation: Patient unable to participate in goal setting Time For Goal Achievement: 02/07/23 Potential to Achieve Goals: Fair ADL Goals Pt Will Perform Grooming: with set-up;with supervision;standing Pt Will Perform Upper Body Bathing: with set-up;with supervision;sitting Pt Will Perform Lower Body Bathing: with set-up;with supervision;sit to/from stand Pt Will Perform Upper Body Dressing: with set-up;sitting Pt Will Perform Lower Body Dressing: with set-up;with supervision;sit to/from stand Pt Will Transfer to Toilet: with supervision;ambulating;regular height toilet;grab bars Pt Will Perform  Toileting - Clothing Manipulation and hygiene: with supervision;sit to/from stand Additional ADL Goal #1: pt will be S in and OOB for basic ADLs Additional ADL Goal #2: pt will follow 2-3 step commands 50% of the session to complete functional tasks  Plan Discharge plan needs to be updated    Co-evaluation    PT/OT/SLP Co-Evaluation/Treatment: Yes Reason for Co-Treatment: Complexity of the patient's impairments (multi-system involvement);Necessary to address cognition/behavior during functional activity;To address functional/ADL transfers   OT goals addressed during session: ADL's and self-care      AM-PAC OT "6 Clicks" Daily Activity     Outcome Measure   Help from another person eating meals?: A Little Help from another person taking care of personal grooming?: Total Help from another person toileting, which includes using toliet, bedpan, or urinal?: Total Help from another person bathing (including washing, rinsing, drying)?: Total Help from another person to put on and taking off regular upper body clothing?: Total Help from another person to put on and taking off regular lower body clothing?: Total 6 Click Score: 8    End of Session    OT Visit Diagnosis: Other abnormalities of gait and mobility (R26.89);Other symptoms and signs involving the nervous system (R29.898);Other symptoms  and signs involving cognitive function;Adult, failure to thrive (R62.7)   Activity Tolerance Treatment limited secondary to agitation (lack of following commands)   Patient Left in bed;with nursing/sitter in room;with family/visitor present;with restraints reapplied   Nurse Communication Mobility status        Time: 1914-7829 OT Time Calculation (min): 12 min  Charges: OT General Charges $OT Visit: 1 Visit OT Treatments $Therapeutic Activity: 8-22 mins  Alfonse Flavors, OTA Acute Rehabilitation Services  Office [phone]   Dewain Penning 01/30/2023, 11:48 AM

## 2023-01-30 NOTE — ED Notes (Signed)
This nurse along with a tech and mother changed pts brief. Pt was incontinent of stool. Pt fighting against care. Bilateral wrist restraints remain in place.

## 2023-01-30 NOTE — ED Notes (Signed)
Day sitter informed me that staffing office is unable to send a sitter for pt tonight, Charge nurse informed.

## 2023-01-30 NOTE — ED Notes (Signed)
Patient noted to be climbing out of bed with sitter at bedside. Patient placed back in bed by RN and sitter. Restraints applied for patient safety.

## 2023-01-30 NOTE — Progress Notes (Signed)
Family meeting is scheduled today at 12:00 PM. Patients mother advised she will be present for the meeting to discuss her plan of care for her daughter.

## 2023-01-30 NOTE — ED Notes (Signed)
Pt mother left for the evening.

## 2023-01-30 NOTE — ED Notes (Signed)
Pharmacy contacted about ensure supplement.

## 2023-01-31 ENCOUNTER — Telehealth: Payer: Self-pay | Admitting: Physician Assistant

## 2023-01-31 NOTE — ED Notes (Signed)
Pt extremely agitated, sitting up in bed, swinging legs, attempting to try to get out of bed. PO ativan taken well. Diaper changed.

## 2023-01-31 NOTE — ED Notes (Signed)
Patient incontinent of stool and urine, brief changed, and linens changed.

## 2023-01-31 NOTE — Progress Notes (Signed)
Family meeting completed with [facility] leadership and hospital administration on 01/30/2023. Patients mother stated her plan is to locate a two bedroom apartment for herself and patient. Patients mother stated she plans to go to the [facility] today and fill out an application. Patients mother stated she is having trouble finding affordable housing. Patients mother currently lives in a senior living development and patient would not be able to stay with her at her current apartment.    Patient would not be appropriate for a skilled nursing facility. The skilled nursing facilities do not have bed rails and would not be able to accommodate any restraints.

## 2023-01-31 NOTE — ED Notes (Signed)
Pt now calm

## 2023-01-31 NOTE — ED Provider Notes (Signed)
Emergency Medicine Observation Re-evaluation Note  Natalie Brown is a 57 y.o. female, seen on rounds today.  Pt initially presented to the ED for complaints of Agitation Currently, the patient is eating breakfast.  Physical Exam  BP 120/73 (BP Location: Left Arm)   Pulse 91   Temp 99.3 F (37.4 C)   Resp 20   Ht 5\' 6"  (1.676 m)   Wt 46 kg   SpO2 100%   BMI 16.37 kg/m  Physical Exam General: eating breakfast, slightly agitated with 2 point restraints on her arms so she doesn't hit the person feeding her Cardiac: rrr Lungs: clear Psych: mildly agitated but will take po's, intermittently kicking her feet   ED Course / MDM  EKG:   I have reviewed the labs performed to date as well as medications administered while in observation.  Recent changes in the last 24 hours include intermittent physical restraints on her arms to prevent harm to self as pt will jump on the bed and try to run away.  Plan  Current plan is for placement.    Gwyneth Sprout, MD 01/31/23 [phone]

## 2023-02-01 NOTE — ED Notes (Signed)
Pt got out of restraints and was found out of the bed. Pt assisted back into bed by 2 staff members. Pt's brief was wet, pt changed w/ 3 person assist d/t pt fighting. Restraints reapplied. Safety mitts applied to hands to prevent pt from removing restraints.

## 2023-02-01 NOTE — ED Provider Notes (Signed)
Emergency Medicine Observation Re-evaluation Note  Natalie Brown is a 57 y.o. female, seen on rounds today.  Pt initially presented to the ED for complaints of Agitation Currently, the patient is sitting up, staring blankly.  Protective mitts in place.  Physical Exam  BP 113/69 (BP Location: Right Arm)   Pulse 97   Temp 98.8 F (37.1 C) (Oral)   Resp 18   Ht 5\' 6"  (1.676 m)   Wt 46 kg   SpO2 97%   BMI 16.37 kg/m  Physical Exam General: withdrawn in appearance, NAD Cardiac: rrr Lungs: no increased Psych: withdrawn  ED Course / MDM  EKG:   I have reviewed the labs performed to date as well as medications administered while in observation.  Recent changes in the last 24 hours include none.  Plan  Current plan is for ongoing efforts for placement.    Gerhard Munch, MD 02/01/23 [phone]

## 2023-02-01 NOTE — ED Notes (Signed)
Assumed care of patient, NAD noted at this time, pt resting in bed, respirations even and unlabored, skin warm and dry, bed in low position, call light within reach. Comfort measures offered. Mother at bedside, pt with small BM, cleaned, restraints in place, sitter at bedside. Will continue to monitor.

## 2023-02-02 NOTE — ED Notes (Signed)
Pt is awake, cleaned, had small BM, pt given snacks and po fluids, with meds. Tolerated all well.

## 2023-02-02 NOTE — ED Provider Notes (Signed)
Emergency Medicine Observation Re-evaluation Note  Natalie Brown is a 57 y.o. female, seen on rounds today.  Pt initially presented to the ED for complaints of Agitation Currently, the patient is resting, similar condition yesterday morning, protective mitts in place.  Physical Exam  BP 103/64 (BP Location: Right Arm)   Pulse 84   Temp 97.7 F (36.5 C) (Oral)   Resp 18   Ht 5\' 6"  (1.676 m)   Wt 46 kg   SpO2 97%   BMI 16.37 kg/m  Physical Exam General: No distress Cardiac: Regular rate and rhythm Lungs: No increased work of breathing Psych: Withdrawn, but calm  ED Course / MDM  EKG:   I have reviewed the labs performed to date as well as medications administered while in observation.  Recent changes in the last 24 hours include none.  Plan  Current plan is for placement.    Gerhard Munch, MD 02/02/23 1143

## 2023-02-02 NOTE — ED Notes (Signed)
NAD noted at this time, pt resting in bed, respirations even and unlabored, skin warm and dry, bed in low position. Comfort measures offered. Will continue to monitor. Sitter at bedside. Pt remains agitated, restless and confused while awake.

## 2023-02-02 NOTE — ED Notes (Signed)
NAD noted at this time, pt resting in bed, respirations even and unlabored, skin warm and dry, bed in low position.  Will continue to monitor.

## 2023-02-02 NOTE — ED Notes (Addendum)
NAD noted at this time, pt resting in bed, respirations even and unlabored, skin warm and dry, bed in low position. Will continue to monitor. Sitter at bedside.

## 2023-02-03 NOTE — Telephone Encounter (Signed)
error 

## 2023-02-03 NOTE — ED Notes (Signed)
Pt received for care at 1900.  At this time, pt resting in bed with eyes closed; respirations even and unlabored.  Bed in lowest position, wheels locked.

## 2023-02-03 NOTE — ED Provider Notes (Signed)
Emergency Medicine Observation Re-evaluation Note  Natalie Brown is a 57 y.o. female, seen on rounds today.  Pt initially presented to the ED for complaints of Agitation Currently, the patient is resting comfortably in her room, mitten restraints remain.  Physical Exam  BP 110/66 (BP Location: Right Arm)   Pulse 64   Temp 97.6 F (36.4 C) (Oral)   Resp 18   Ht 5\' 6"  (1.676 m)   Wt 46 kg   SpO2 100%   BMI 16.37 kg/m  Physical Exam Vitals reviewed.  Constitutional:      Appearance: Normal appearance.  Cardiovascular:     Rate and Rhythm: Normal rate.  Pulmonary:     Effort: Pulmonary effort is normal.  Abdominal:     General: There is no distension.  Neurological:     General: No focal deficit present.     Mental Status: [name] alert.  Psychiatric:        Mood and Affect: Mood normal.      ED Course / MDM  EKG:   I have reviewed the labs performed to date as well as medications administered while in observation.  Recent changes in the last 24 hours include possibility of placement.  Plan  Current plan is for still continuing to try for placement.   Anders Simmonds T, DO 02/03/23 [phone]

## 2023-02-03 NOTE — Progress Notes (Signed)
CSW reached out to patients mother to get an update on her apartment search. CSW is awaiting a call back.

## 2023-02-04 MED ORDER — MIDAZOLAM HCL 2 MG/2ML IJ SOLN
2.0000 mg | Freq: Once | INTRAMUSCULAR | Status: AC
  Administered 2023-02-04: 2 mg via INTRAMUSCULAR
  Filled 2023-02-04: qty 2

## 2023-02-04 MED ORDER — HALOPERIDOL LACTATE 5 MG/ML IJ SOLN
5.0000 mg | Freq: Once | INTRAMUSCULAR | Status: AC
  Administered 2023-02-04: 5 mg via INTRAMUSCULAR
  Filled 2023-02-04: qty 1

## 2023-02-04 MED ORDER — DIPHENHYDRAMINE HCL 50 MG/ML IJ SOLN
25.0000 mg | Freq: Once | INTRAMUSCULAR | Status: AC
  Administered 2023-02-04: 25 mg via INTRAMUSCULAR
  Filled 2023-02-04: qty 1

## 2023-02-04 NOTE — ED Notes (Signed)
Pt noted to try to climb over bed rail.  This RN and techs on unit checked pt and found pt to be soiled.  Brief changed and pt somewhat calmer, but still shaking in bed some.  Pt provided with PO intake; tolerated well.

## 2023-02-04 NOTE — Progress Notes (Addendum)
PT Cancellation Note  Patient Details Name: Natalie Brown MRN: [id number] DOB: [birth year]   Cancelled Treatment:    Reason Eval/Treat Not Completed: (P) Fatigue/lethargy limiting ability to participate (Pt lethargic in AM. RN defer in PM due to pt agitation.) Will continue efforts next date per PT plan of care as schedule permits.   Dorathy Kinsman Armoni Kludt 02/04/2023, 5:51 PM

## 2023-02-04 NOTE — ED Notes (Signed)
I attempted to wake pt to take her 1000 meds, but pt refused to wake up to take meds. Pt is breathing and moving

## 2023-02-04 NOTE — ED Notes (Signed)
Pt mother called for a update

## 2023-02-04 NOTE — ED Provider Notes (Signed)
Emergency Medicine Observation Re-evaluation Note  Natalie Brown is a 57 y.o. female, seen on rounds today.  Pt initially presented to the ED for complaints of Agitation Currently, the patient is asleep.  Physical Exam  BP 97/60 (BP Location: Right Arm)   Pulse 78   Temp 98.5 F (36.9 C) (Oral)   Resp 17   Ht 5\' 6"  (1.676 m)   Wt 46 kg   SpO2 100%   BMI 16.37 kg/m  Physical Exam General: nad Lungs: equal chest rise Psych: sleeping  ED Course / MDM  EKG:   I have reviewed the labs performed to date as well as medications administered while in observation.  Recent changes in the last 24 hours include no changes. Needed restraints overnight   Plan  Current plan is for placement.    Sloan Leiter, DO 02/04/23 [phone]

## 2023-02-04 NOTE — ED Notes (Addendum)
Pt attempting to get out of bed at this time.  This RN and techs on unit attempting to calm pt and ensure her safety.  Staff notifying MD for restraint re-order and possible medication due to pt's high-risk for injury to self/falling.

## 2023-02-04 NOTE — Progress Notes (Signed)
CSW received a call back from patients mother. [name] stated that the [place] apartments are not accepting any applications until November 4th. [name] was told she needs to be at the office at 8:30 AM on November 4th. [name] stated she plans on going to the [facility] apartments today to see if she can complete an application.

## 2023-02-04 NOTE — ED Notes (Signed)
Pt diaper changed, pericare provided. Pt being feed by sitter.

## 2023-02-04 NOTE — Progress Notes (Signed)
CSW reached out to patients mother to get an update on her apartment search. CSW is unable to leave a message due to the mailbox being full. CSW sent a text message requesting a call.

## 2023-02-05 NOTE — ED Notes (Addendum)
Pt's sister bypassed security and came in through the back entrance of [facility]. Security assessed that visitor did not have a green visitation band on. Security walked visitor out to security desk to obtain green armband. Overheard visitor asking security how they knew, and did someone tell them.

## 2023-02-05 NOTE — Progress Notes (Signed)
OT Cancellation Note  Patient Details Name: LUNDYN COSTE MRN: [id number] DOB: 08-Jun-1965   Cancelled Treatment:    Reason Eval/Treat Not Completed: Other (comment) (Attempted patient in am and patient a sleep and nursing requesting patient not to be disturbed. Patient attempted this afternoon with patient unable to follow directions. OT to attempt on another date.) Alfonse Flavors, OTA Acute Rehabilitation Services  Office [phone]  Dewain Penning 02/05/2023, 2:39 PM

## 2023-02-05 NOTE — ED Notes (Signed)
Pt noted to have chewing gum in her mouth and a piece on the bedside table. Pt unable to follow commands to spit out the gum. Pt swallowed gum when eating applesauce and prosource. Gum removed from bedside table for pt safety d/t high risk for aspiration.

## 2023-02-05 NOTE — ED Provider Notes (Signed)
Emergency Medicine Observation Re-evaluation Note  Natalie Brown is a 57 y.o. female, seen on rounds today.  Pt initially presented to the ED for complaints of Agitation Currently, the patient is asleep.  Physical Exam  BP 135/79 (BP Location: Right Arm)   Pulse 86   Temp 97.7 F (36.5 C) (Axillary)   Resp 17   Ht 5\' 6"  (1.676 m)   Wt 46 kg   SpO2 100%   BMI 16.37 kg/m  Physical Exam General: nad Lungs: equal chest rise Psych: sleeping  ED Course / MDM  EKG:   I have reviewed the labs performed to date as well as medications administered while in observation.  Recent changes in the last 24 hours include no changes.   Plan  Current plan is for placement.      [name], MD 02/05/23 [phone]

## 2023-02-05 NOTE — ED Notes (Signed)
Messaged pharmacy to send ensure

## 2023-02-05 NOTE — Progress Notes (Signed)
PT Discharge Note  Patient Details Name: KEIDY THURGOOD MRN: [id number] DOB: Aug 31, 1965   Cancelled Treatment:    Reason Eval/Treat Not Completed: Other (comment) (Spoke with RN. Pt continues with behavior issues and severe agitation. Pt is unable to follow any directions making her inappropriate for skilled physical therapy intervention. Due to pt inability to participate in skilled physical therapy services and good strength pt will be discharged from acute physical therapy services at this time. Please re-consult if further needs arise or pt becomes appropriate.)  Harrel Carina, DPT, CLT  Acute Rehabilitation Services Office: [phone] (Secure chat preferred)   Claudia Desanctis 02/05/2023, 10:14 AM

## 2023-02-05 NOTE — ED Notes (Signed)
Pt's mother visiting at this time.

## 2023-02-06 NOTE — Progress Notes (Signed)
CSW contact [facility] who received CSW's request for personal care services. CSW was told they do not manage patient and to through [facility]. [facility] also told CSW they do not manage patient. CSW was transferred to [place] [facility] and [facility] who also do not manage patient. CSW contacted [facility] back and was transferred to [facility] services. CSW spoke with Nolon Nations who told CSW that patients care manager is [employer]. CSW scheduled a zoom meeting for 10/22 at 1:00 PM to determine if patient will qualify for personal care services. CSW was told that patient sounds like a good candidate for the 1915 I assessment.

## 2023-02-06 NOTE — ED Provider Notes (Signed)
Emergency Medicine Observation Re-evaluation Note  Natalie Brown is a 57 y.o. female, seen on rounds today.  Pt initially presented to the ED for complaints of Agitation Currently, the patient is currently sitting up in bed watching her sitter but is out of restraints at this time.  Physical Exam  BP 106/70 (BP Location: Left Arm)   Pulse 76   Temp 98.2 F (36.8 C) (Axillary)   Resp 16   Ht 5\' 6"  (1.676 m)   Wt 46 kg   SpO2 100%   BMI 16.37 kg/m  Physical Exam General: Awake and in no acute distress Cardiac: Regular Lungs: Clear Psych: Calm  ED Course / MDM  EKG:   I have reviewed the labs performed to date as well as medications administered while in observation.  Recent changes in the last 24 hours include did have an episode where she attempted to get out of her room yesterday requiring 3 people to restrain her and get her back in bed requiring restraints for a period of the day to prevent self-harm.  Plan  Current plan is for placement.    Gwyneth Sprout, MD 02/06/23 [phone]

## 2023-02-06 NOTE — Progress Notes (Signed)
CSW spoke with patients case worker with [facility] [phone]. Ms. Alycia Rossetti stated she wasn't aware patient has been in the hospital for over a year. Ms. Laural Benes stated last year she called around to the homes to find placement for patient but they stated due to her age and dementia diagnosis that they couldn't accept her. Ms. Aura Camps stated she collaborated with DSS and assumed DSS had found placement for patient. Ms. Aura Camps stated patients assessment on 02/11/23 will be about 1-2 hours long. Patient will need an updated TCM and crisis care plan. CSW will notify patients mother.

## 2023-02-06 NOTE — Progress Notes (Signed)
Occupational Therapy Treatment Patient Details Name: Natalie Brown MRN: [id number] DOB: 05-11-1965 Today's Date: 02/06/2023   History of present illness Pt is a 57 y.o. female who presented back to [facility] 01/22/23 after the facility she discharged to earlier that day declined her as pt became combative and facility reported they were unable to provide care for her. Of note, was admitted 12/24/21-01/22/23 before discharging to that facility. Admitted to [facility] on 09/07/2021 for catatonia treatment, arrived from [facility] on 12/24/21 for tachycardia, covid + 01/12/22, Fall on 08/26/2022. 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   OT comments  Patient received in supine, out of restraints and with sitter. Sitter states that patient ate well for lunch but required assistance to be fed. Patient assisted to EOB with mod assist and tolerated 1-2 minutes of sitting before patient returned self to supine. Grooming tasks attempted with [facility] to initiate with little to no patient participation. Patient instructed on reaching task with patient unable to follow direction. Acute OT to continue to follow to address following directions and self care.       If plan is discharge home, recommend the following:  Two people to help with walking and/or transfers;A lot of help with bathing/dressing/bathroom;Assistance with cooking/housework;Direct supervision/assist for medications management;Help with stairs or ramp for entrance;Assist for transportation;Direct supervision/assist for financial management   Equipment Recommendations  Other (comment) (TBD next venue of care)    Recommendations for Other Services      Precautions / Restrictions Precautions Precautions: Fall Precaution Comments: very impulsive Restrictions Weight Bearing Restrictions: No       Mobility Bed Mobility Overal bed mobility: Needs Assistance Bed Mobility: Supine to Sit, Sit to  Supine     Supine to sit: Mod assist Sit to supine: Supervision   General bed mobility comments: assisted patient to get to EOB with patient tolerating 1-2 minutes EOB before returning to supine, resistant to stay on EOB    Transfers Overall transfer level: Needs assistance                 General transfer comment: not attempted due to limited sitting on EOB     Balance Overall balance assessment: Needs assistance Sitting-balance support: No upper extremity supported Sitting balance-Leahy Scale: Good Sitting balance - Comments: able to sit on EOB but resistant to stay on EOB and assisted self back to supine                                   ADL either performed or assessed with clinical judgement   ADL Overall ADL's : Needs assistance/impaired     Grooming: Total assistance Grooming Details (indicate cue type and reason): attempted hand over hand to wash face with patient being resistive                               General ADL Comments: unable to follow directions for grooming tasks    Extremity/Trunk Assessment              Vision       Perception     Praxis      Cognition Arousal: Alert Behavior During Therapy: Flat affect, Impulsive Overall Cognitive Status: History of cognitive impairments - at baseline Area of Impairment: Following commands, Safety/judgement  Orientation Level: Person   Memory: Decreased short-term memory, Decreased recall of precautions Following Commands: Follows one step commands inconsistently Safety/Judgement: Decreased awareness of safety, Decreased awareness of deficits   Problem Solving: Requires verbal cues, Requires tactile cues General Comments: non-verbal, less impulive, not attempting to leave        Exercises      Shoulder Instructions       General Comments      Pertinent Vitals/ Pain       Pain Assessment Pain Assessment: Faces Faces Pain Scale:  No hurt Pain Intervention(s): Monitored during session  Home Living                                          Prior Functioning/Environment              Frequency  Min 1X/week        Progress Toward Goals  OT Goals(current goals can now be found in the care plan section)  Progress towards OT goals: Not progressing toward goals - comment (due to limited patient participation)  Acute Rehab OT Goals OT Goal Formulation: Patient unable to participate in goal setting Time For Goal Achievement: 02/07/23 Potential to Achieve Goals: Fair ADL Goals Pt Will Perform Grooming: with set-up;with supervision;standing Pt Will Perform Upper Body Bathing: with set-up;with supervision;sitting Pt Will Perform Lower Body Bathing: with set-up;with supervision;sit to/from stand Pt Will Perform Upper Body Dressing: with set-up;sitting Pt Will Perform Lower Body Dressing: with set-up;with supervision;sit to/from stand Pt Will Transfer to Toilet: with supervision;ambulating;regular height toilet;grab bars Pt Will Perform Toileting - Clothing Manipulation and hygiene: with supervision;sit to/from stand Additional ADL Goal #1: pt will be S in and OOB for basic ADLs Additional ADL Goal #2: pt will follow 2-3 step commands 50% of the session to complete functional tasks  Plan Discharge plan needs to be updated    Co-evaluation                 AM-PAC OT "6 Clicks" Daily Activity     Outcome Measure   Help from another person eating meals?: A Lot Help from another person taking care of personal grooming?: Total Help from another person toileting, which includes using toliet, bedpan, or urinal?: Total Help from another person bathing (including washing, rinsing, drying)?: Total Help from another person to put on and taking off regular upper body clothing?: Total Help from another person to put on and taking off regular lower body clothing?: Total 6 Click Score: 7    End  of Session    OT Visit Diagnosis: Other abnormalities of gait and mobility (R26.89);Other symptoms and signs involving the nervous system (R29.898);Other symptoms and signs involving cognitive function;Adult, failure to thrive (R62.7)   Activity Tolerance Other (comment) (limited participation)   Patient Left in bed;with nursing/sitter in room   Nurse Communication Mobility status;Other (comment) (limited patient participation)        Time: 6606-3016 OT Time Calculation (min): 11 min  Charges: OT General Charges $OT Visit: 1 Visit OT Treatments $Self Care/Home Management : 8-22 mins  Alfonse Flavors, OTA Acute Rehabilitation Services  Office [phone]   Dewain Penning 02/06/2023, 2:45 PM

## 2023-02-07 MED ORDER — HALOPERIDOL LACTATE 5 MG/ML IJ SOLN
2.5000 mg | Freq: Once | INTRAMUSCULAR | Status: AC
  Administered 2023-02-07: 2.5 mg via INTRAMUSCULAR
  Filled 2023-02-07: qty 1

## 2023-02-07 NOTE — ED Notes (Signed)
Visitor at bedside.

## 2023-02-07 NOTE — ED Notes (Signed)
Attempted to remove wrist restraints.  Patient began to pull at restraints and appears to become agitated.  Will allow patient to rest and attempt removal shortly

## 2023-02-07 NOTE — ED Notes (Signed)
Assumed care of patient, NAD noted at this time, pt resting in bed, respirations even and unlabored, skin warm and dry, bed in low position, safety sitter at bedside. Will continue to monitor. Pt is sleeping

## 2023-02-07 NOTE — ED Notes (Signed)
Pt still appears agitated at this time. Lying in bed with wrist restraints, moving legs and visibly agitated. Restraints continued at this time.

## 2023-02-07 NOTE — ED Provider Notes (Signed)
Emergency Medicine Observation Re-evaluation Note  TYQUANA HUSTED is a 57 y.o. female, seen on rounds today.  Pt initially presented to the ED for complaints of Agitation Currently, the patient is calm, and there are no concerns from nursing staff.  Physical Exam  BP 100/72 (BP Location: Right Arm)   Pulse 100   Temp 98.3 F (36.8 C) (Axillary)   Resp (!) 21   Ht 5\' 6"  (1.676 m)   Wt 46 kg   SpO2 100%   BMI 16.37 kg/m  Physical Exam General: No distress Cardiac: Regular rate Lungs: No respiratory distress Psych: Currently calm  ED Course / MDM  EKG:   I have reviewed the labs performed to date as well as medications administered while in observation.  Recent changes in the last 24 hours include -no new changes.  Plan  Current plan is for patient to be held in the ER for placement.  It appears that [facility] team is communicating with DSS, who has guardianship of the patient.    Derwood Kaplan, MD 02/07/23 [phone]

## 2023-02-07 NOTE — ED Notes (Signed)
Pt is sleeping at this time, NAD noted, respirations even and unlabored, skin warm and dry, will continue to monitor.

## 2023-02-07 NOTE — ED Notes (Signed)
Pt given PO meds crushed in apple sauce, tol well, did finish the apple sauce but did not fish the entire ensure, facial expression noted by this RN that pt did not like the taste of the ensure.

## 2023-02-07 NOTE — BH Assessment (Signed)
TTS consult deferred to IRIS. IRIS provider Renford Dills, NP, will complete assessment at 2300. Secure chat established.

## 2023-02-07 NOTE — ED Notes (Signed)
NAD noted at this time, pt resting in bed, respirations even and unlabored, skin warm and dry, bed in low position. Comfort measures offered. Will continue to monitor. Safety sitter at bedside

## 2023-02-07 NOTE — ED Notes (Signed)
Multiple attempts made to administer medications.  Attempted prior to cleaning patient and after.  Patient would not open eyes or attempt to take medications.  After performing hygiene, patient continued to keep eyes closed and would not take medications.  Respirations even and unlabored.  Will responded by moving legs or changing positions, however will not take oral medications at this time.

## 2023-02-07 NOTE — ED Notes (Signed)
Pt in bed, appears cooperative at this time. Wrist restraints removed.

## 2023-02-07 NOTE — ED Notes (Signed)
Pt attempting to get OOB at this time. Restraints and safety sitter continued. Pt redirected into bed, still appears visibly agitated.

## 2023-02-07 NOTE — ED Notes (Addendum)
After sitter left bedside pt attempting to get OOB again and attempting to stand on bed. This RN entered room pt continuing to push way up in bed. Staff assistance to bedside to help pt back into seated position in bed. Placed back in wrist restraints at this time. Appears visibly agitated again at this time. MD Jearld Fenton made aware pt being placed back in restraints.

## 2023-02-07 NOTE — ED Notes (Signed)
NAD noted at this time, respirations even and unlabored, skin warm and dry, bed in low position, pt cleaned of urine incontinence. Will continue to monitor.

## 2023-02-07 NOTE — ED Provider Notes (Addendum)
Called to evaluate patient with h/o bipolar 1 disorder, dementia with behavioral disturbance with recent prolonged hospitalization brought to ED d/t agitation at facility on 10/2, unfortunately the same day she was discharged. Just now, she became agitated, stood up on top of the bed, is currently requiring wrist restraints and is still moving all over the bed, not responsive to questions.  When asked if she is hurting anywhere she does not respond.  [name] alert and trying to crawl out of the bed but is in wrist restraints, banging her legs on the bed.  Concerned that patient may hurt herself.  She has just received her 1 mg p.o. Ativan as well as 25 mg p.o. Ativan for as needed anxiety/agitation. Will give 2.5 mg IM haldol and reassess. Also in chart review I see that patient has not been evaluated by psychiatry team since she has been here in the ED this time, will consult to psych for any additional input and optimization of daily and PRN meds for agitation/anxiety.   [name], MD 02/07/23 1644    [name], MD 02/07/23 [phone]

## 2023-02-07 NOTE — ED Notes (Addendum)
Pt up out of bed, unsteady gait, visibly agitated, this RN attempted walking pt to bathroom and around the unit. Attempts to calm pt unsuccessful. Pt ambulatory back to room with staff assistance for safety. When back in bed pt began climbing up to the head of the bed and standing in bed. Pt encouraged to sit down in bed requiring multiple staff members assistance to get pt to sit in bed and to avoid injury. Pt continues to appear agitated. Placed in wrist restraints for safety.

## 2023-02-07 NOTE — ED Notes (Signed)
No changes, pt sleeping at this time, NAD noted.

## 2023-02-07 NOTE — ED Notes (Signed)
MD Jearld Fenton made aware of pt agitation and medications given. Unable to keep pt in bed, still attempting to get OOB with wrist restraints. Continues to appear visibly agitated. Pt unable to communicate concerns.

## 2023-02-07 NOTE — ED Notes (Signed)
Patient incontinent of urine.  Patient's adult brief changed.  Patient kicked legs several times when attempting to change. Once care was complete, patient settled down and continued to rest.

## 2023-02-07 NOTE — ED Notes (Signed)
Meal tray provided, fed by sitter at bedside.

## 2023-02-07 NOTE — Progress Notes (Signed)
CSW contacted patients mother [name]. Ms. Dan Humphreys stated she is still looking for apartments but has been having trouble with the offices answering the phone. Ms. Dan Humphreys stated her plan is to start going in person. CSW notified patients mother of patients assessment on 02/11/23. Patients mother plans on attending.

## 2023-02-07 NOTE — ED Notes (Signed)
Sitter not available for pt, bed alarm on.

## 2023-02-07 NOTE — ED Notes (Signed)
Pt was fed & then staff changed her brief, pt tol well.

## 2023-02-07 NOTE — ED Notes (Signed)
Pt got out of the bed, trying to come out of the room, pt assisted back to bed, cleaned and dried of incontinent urine, pt pulling and kicking at staff during cleaning.

## 2023-02-08 ENCOUNTER — Encounter (HOSPITAL_COMMUNITY): Payer: Self-pay | Admitting: Psychiatry

## 2023-02-08 DIAGNOSIS — F3110 Bipolar disorder, current episode manic without psychotic features, unspecified: Secondary | ICD-10-CM

## 2023-02-08 DIAGNOSIS — F03918 Unspecified dementia, unspecified severity, with other behavioral disturbance: Secondary | ICD-10-CM

## 2023-02-08 DIAGNOSIS — R451 Restlessness and agitation: Secondary | ICD-10-CM | POA: Diagnosis not present

## 2023-02-08 DIAGNOSIS — F319 Bipolar disorder, unspecified: Secondary | ICD-10-CM | POA: Diagnosis not present

## 2023-02-08 LAB — CBC WITH DIFFERENTIAL/PLATELET
Abs Immature Granulocytes: 0.01 10*3/uL (ref 0.00–0.07)
Basophils Absolute: 0.1 10*3/uL (ref 0.0–0.1)
Basophils Relative: 1 %
Eosinophils Absolute: 0.2 10*3/uL (ref 0.0–0.5)
Eosinophils Relative: 4 %
HCT: 37 % (ref 36.0–46.0)
Hemoglobin: 12.1 g/dL (ref 12.0–15.0)
Immature Granulocytes: 0 %
Lymphocytes Relative: 28 %
Lymphs Abs: 1.4 10*3/uL (ref 0.7–4.0)
MCH: 29.7 pg (ref 26.0–34.0)
MCHC: 32.7 g/dL (ref 30.0–36.0)
MCV: 90.7 fL (ref 80.0–100.0)
Monocytes Absolute: 0.4 10*3/uL (ref 0.1–1.0)
Monocytes Relative: 8 %
Neutro Abs: 3 10*3/uL (ref 1.7–7.7)
Neutrophils Relative %: 59 %
Platelets: 291 10*3/uL (ref 150–400)
RBC: 4.08 MIL/uL (ref 3.87–5.11)
RDW: 13.6 % (ref 11.5–15.5)
WBC: 5.1 10*3/uL (ref 4.0–10.5)
nRBC: 0 % (ref 0.0–0.2)

## 2023-02-08 LAB — COMPREHENSIVE METABOLIC PANEL
ALT: 20 U/L (ref 0–44)
AST: 28 U/L (ref 15–41)
Albumin: 2.9 g/dL — ABNORMAL LOW (ref 3.5–5.0)
Alkaline Phosphatase: 58 U/L (ref 38–126)
Anion gap: 8 (ref 5–15)
BUN: 20 mg/dL (ref 6–20)
CO2: 26 mmol/L (ref 22–32)
Calcium: 8.9 mg/dL (ref 8.9–10.3)
Chloride: 104 mmol/L (ref 98–111)
Creatinine, Ser: 1.03 mg/dL — ABNORMAL HIGH (ref 0.44–1.00)
GFR, Estimated: >60 mL/min (ref >60)
Glucose, Bld: 82 mg/dL (ref 70–99)
Potassium: 4.1 mmol/L (ref 3.5–5.1)
Sodium: 138 mmol/L (ref 135–145)
Total Bilirubin: 0.5 mg/dL (ref 0.3–1.2)
Total Protein: 6.2 g/dL — ABNORMAL LOW (ref 6.5–8.1)

## 2023-02-08 MED ORDER — LORAZEPAM 0.5 MG PO TABS
0.5000 mg | ORAL_TABLET | Freq: Three times a day (TID) | ORAL | Status: DC
  Administered 2023-02-09 – 2023-03-12 (×84): 0.5 mg via ORAL
  Filled 2023-02-08 (×90): qty 1

## 2023-02-08 NOTE — Progress Notes (Signed)
Patients mother will continue to look for housing for patient. Patient is not appropriate for skilled nursing placement due to patient requiring a sitter daily and restraints. The skilled nursing facilities also do not have bed rails for patient. CSW will continue to work with patients insurance company to arrange home aide services in the event patients mother is able to find housing for herself and patient. Patient has an assessment next week on 02/11/23 to determine if she qualifies for personal care services at home.

## 2023-02-08 NOTE — Consult Note (Addendum)
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 AA female with a past psychiatric/medical history of early onset dementia, bipolar affective disorder, catatonia, vitamin B12 deficiency, CKD stage IIIa, hypertension, and severe protein calorie malnutrition, who is currently a border within the emergency department while placement at an alternative living facility is obtained, due to the patient reportedly upon transfer and discharge from [facility] to her new living arrangements, had an anxiety/stress reaction in route, which led to the patient being denied upon arrival and sent back to the emergency department for safety and continuation of care.   Patient prior to attempted discharged has appreciably been within the [facility] health system's care from 12/24/2021- current.  Patient was originally admitted medically on 12/24/2021 due to possible catatonia/altered mental status from the behavioral health hospital (later diagnosed with early onset dementia).  HPI:  Patient seen today at the [facility] emergency department for face-to-face psychiatric evaluation.  Upon evaluation, engagement largely characterized by atypical interpersonal style, complete refusal and/or inability to participate in any meaningful physical examination and/or mental status examination, restricted affect, and poor eye contact, with notable rocking back and forth that begins upon engagement with the patient (ceases appreciably with nursing  staff).  Patient during evaluation does not appear to be responding to internal stimuli.  Patient does not appear to be in any physical distress and or pain, does not endorse any physical pain when asked multiple times. Patient does not appear to be having fluctuations of consciousness.  Patient appears cachectic and malnourished.   Past Psychiatric History: As below  Per Dr. Betsey Holiday note 01/20/2023  Information collected from chart. Bipolar affective disorder with psychotic features, MNCD Admitted to [facility] since Sep 06, 2021  Risk to Self or Others: Is the patient at risk to self? No Has the patient been a risk to self in the past 6 months? Yes Has the patient been a risk to self within the distant past? Yes Is the patient a risk to others? No Has the patient been a risk to others in the past 6 months? Yes Has the patient been a risk to others within the distant past? Yes  [place] Scale:  Flowsheet Row ED from 01/22/2023 in [facility] Emergency Department at [facility] ED to Hosp-Admission (Discharged) from 12/24/2021 in [facility] ED to Hosp-Admission (Discharged) from 09/06/2021 in [facility] Emergency Department at [facility]  C-SSRS RISK CATEGORY Error: Question 6 not populated No Risk No Risk      Substance Abuse: As below  Per Dr. Betsey Holiday note 01/20/2023  Tobacco use: Half PPD; no use since admission Alcohol use: No use since admission Drug use: Denies; UDS negative on admission in May  Past Medical History:  Past Medical History:  Diagnosis Date   Bipolar 1 disorder with moderate mania (HCC) 12/30/2013   Hypertension    History reviewed. No pertinent surgical history. Family History: History reviewed. No pertinent family history. Family Psychiatric  History:   Per  Dr. Betsey Holiday note 01/20/2023  Family psych history: Patient reports mental illness runs in her father's side of the family.   Social History:  Social  History   Substance and Sexual Activity  Alcohol Use No     Social History   Substance and Sexual Activity  Drug Use No    Social History   Socioeconomic History   Marital status: Married    Spouse name: Not on file   Number of children: Not on file   Years of education: Not on file   Highest education level: Not on file  Occupational History   Not on file  Tobacco Use   Smoking status: Every Day   Smokeless tobacco: Current  Substance and Sexual Activity   Alcohol use: No   Drug use: No   Sexual activity: Not on file  Other Topics Concern   Not on file  Social History Narrative   Not on file   Social Determinants of Health   Financial Resource Strain: Not on file  Food Insecurity: No Food Insecurity (01/24/2023)   Hunger Vital Sign    Worried About Running Out of Food in the Last Year: Never true    Ran Out of Food in the Last Year: Never true  Transportation Needs: Patient Unable To Answer (01/24/2023)   PRAPARE - Transportation    Lack of Transportation (Medical): Patient unable to answer    Lack of Transportation (Non-Medical): Patient unable to answer  Physical Activity: Not on file  Stress: Not on file  Social Connections: Not on file   Additional Social History:    Allergies:   Allergies  Allergen Reactions   Penicillins Itching    Labs: No results found for this or any previous visit (from the past 48 hour(s)).  Current Facility-Administered Medications  Medication Dose Route Frequency Provider Last Rate Last Admin   (feeding supplement) PROSource Plus liquid 30 mL  30 mL Oral BID BM Rondel Baton, MD   30 mL at 02/08/23 0923   feeding supplement (ENSURE ENLIVE / ENSURE PLUS) liquid 237 mL  237 mL Oral BID BM Rondel Baton, MD   237 mL at 02/07/23 1416   hydrOXYzine (ATARAX) tablet 25 mg  25 mg Oral TID Glyn Ade, MD   25 mg at 02/08/23 0922   lactulose (CHRONULAC) 10 GM/15ML solution 20 g  20 g Oral BID PRN Rondel Baton, MD        LORazepam (ATIVAN) tablet 1 mg  1 mg Oral Q6H PRN Glyn Ade, MD   1 mg at 02/08/23 1227   multivitamin with minerals tablet 1 tablet  1 tablet Oral Daily Rondel Baton, MD   1 tablet at 02/08/23 1610   OLANZapine (ZYPREXA) tablet 2.5 mg  2.5 mg Oral QHS Cathren Laine, MD   2.5 mg at 02/06/23 2120   polyethylene glycol (MIRALAX / GLYCOLAX) packet 17 g  17 g Oral Daily Rondel Baton, MD   17 g at 02/08/23 9604   Current Outpatient Medications  Medication Sig Dispense Refill   Cyanocobalamin (B-12 COMPLIANCE INJECTION IJ) Inject as directed.     feeding supplement (ENSURE ENLIVE / ENSURE PLUS) LIQD Take 237 mLs by mouth 2 (two) times daily between meals.     lactulose (CHRONULAC) 10 GM/15ML solution Take 30 mLs (20 g total) by mouth 2 (two) times daily as needed for mild constipation.     LORazepam (ATIVAN) 2 MG/ML concentrated solution Take 0.5 mLs (1 mg total)  by mouth 3 (three) times daily. 3 mL 0   Multiple Vitamin (MULTIVITAMIN WITH MINERALS) TABS tablet Take 1 tablet by mouth daily.     Nutritional Supplements (,FEEDING SUPPLEMENT, PROSOURCE PLUS) liquid Take 30 mLs by mouth 2 (two) times daily between meals.     polyethylene glycol (MIRALAX / GLYCOLAX) 17 g packet Take 17 g by mouth daily. (Patient taking differently: Take 17 g by mouth daily as needed for mild constipation.) 14 each 0   Incontinence Supply Disposable (BRIEFS OVERNIGHT MEDIUM) MISC 1 each by Does not apply route every 4 (four) hours as needed (soiling). 100 each 2    Musculoskeletal: Strength & Muscle Tone: decreased and atrophy Gait & Station: unsteady Patient leans: N/A   Psychiatric Specialty Exam: Presentation  General Appearance:  Other (comment) (jerking motion, in fetal position, appropriately clothed and covered)  Eye Contact: Fleeting  Speech: Blocked  Speech Volume: Other (comment) (Nonverbal)  Handedness: Right   Mood and Affect  Mood: --  (Nonverbal)  Affect: Restricted   Thought Process  Thought Processes: Other (comment) (Nonverbal)  Descriptions of Associations:-- (Nonverbal)  Orientation:Other (comment) (Nonverbal)  Thought Content:Other (comment) (Nonverbal)  History of Schizophrenia/Schizoaffective disorder:Yes  Duration of Psychotic Symptoms:Greater than six months  Hallucinations:Hallucinations: Other (comment) (None appreciated, nonverbal)  Ideas of Reference:Other (comment) (Nonverbal)  Suicidal Thoughts:Suicidal Thoughts: -- (Nonverbal)  Homicidal Thoughts:Homicidal Thoughts: -- (Nonverbal)   Sensorium  Memory: Other (comment) (Nonverbal)  Judgment: Impaired  Insight: Lacking   Executive Functions  Concentration: Poor  Attention Span: Poor  Recall: Poor  Fund of Knowledge: Poor  Language: Poor   Psychomotor Activity  Psychomotor Activity: Psychomotor Activity: Other (comment) (restless, jerking motion in fetal position, refuses physical exam)   Assets  Assets: Resilience; Social Support    Sleep  Sleep: Sleep: -- (Nonverbal)   Physical Exam: Physical Exam Vitals and nursing note reviewed.  Constitutional:      Comments: Cachectic   Pulmonary:     Effort: Pulmonary effort is normal.  Skin:    General: Skin is warm and dry.  Neurological:     Comments: Grossly intact   Psychiatric:        Attention and Perception: [name] inattentive.        Mood and Affect: Affect is blunt and flat.        Speech: [name] noncommunicative.        Behavior: Behavior is uncooperative and withdrawn.    Review of Systems  Unable to perform ROS: Dementia   Blood pressure 115/62, pulse 78, temperature 98.9 F (37.2 C), temperature source Oral, resp. rate 15, height 5\' 6"  (1.676 m), weight 46 kg, SpO2 100%. Body mass index is 16.37 kg/m.  Medical Decision Making:  Upon evaluation of the patient, evaluation is significantly limited due to the patient's ability to  participate; However, upon chart review, as well as evaluation conducted today, the patient does not present with any clinical features not consistent with the patient's chronic illness course of dementia with behavioral disturbance. Additionally, there is low clinical suspicion for catatonia, Loni Beckwith largely negative conducted today, as well as during evaluation there is no symptomology consistent with the patient's chronic and historical diagnosis of bipolar affective disorder.  Call placed and conversation held with the patient's mother i.e. the legal guardian.  Guardian authorizes approval of the recommendation today for restarting of Ativan scheduled. Guardian additionally amenable to discontinuing hydroxyzine 25 mg p.o. 3 times daily.  Patient has historically and recently done well with  scheduled Ativan, and does poorly with high potency/high dose antipsychotics, thus the recommendation at this time is for utilization of Ativan as below, and a conservative approach towards utilization of antipsychotics.  Additionally recommend discontinuation of hydroxyzine, likely not helping, given anticholinergic properties.  Psychiatry will continue to follow the patient at this time to aid in behavioral disturbances that have been appreciable by EDP team.  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 3 times daily -Recommend continue safety precautions -Recommend repeat ECG for QTC monitoring  -Recommend restart Ativan but with dose modification at this time to 0.5 mg p.o. 3 times daily (down from 1mg  p.o. TID)  -Recommend low-dose/low potency antipsychotics i.e. olanzapine if needed as needed -Recommend continue olanzapine as scheduled  Disposition: No evidence of imminent risk to self or others at present.   Patient does not meet criteria for psychiatric inpatient admission. Psychiatry will  continue to follow the patient at this time to facilitate medication adjustments to help with agitations.  Lenox Ponds, NP 02/08/2023 12:35 PM

## 2023-02-08 NOTE — ED Notes (Signed)
Patient appears to be waking up.  Currently rocking in bed with eyes closed.  Will continue to monitor

## 2023-02-08 NOTE — ED Notes (Signed)
Pt very restless, fidgeting with linens & trying to get out of bed.  Redirection is not currently working.  Will administer PO anxiety medication.

## 2023-02-08 NOTE — ED Provider Notes (Addendum)
Emergency Medicine Observation Re-evaluation Note  Natalie Brown is a 57 y.o. female, seen on rounds today.  Pt initially presented to the ED for complaints of Agitation Currently, the patient is asleep.  Nursing staff have no complaints or concerns.  Patient in the evening yesterday had some agitation and required restraints.   Physical Exam  BP 115/62 (BP Location: Right Arm)   Pulse 78   Temp 98.9 F (37.2 C) (Oral)   Resp 15   Ht 5\' 6"  (1.676 m)   Wt 46 kg   SpO2 100%   BMI 16.37 kg/m  Physical Exam General: No acute distress Cardiac: Regular rate Lungs: No respiratory distress Psych: Currently calm  ED Course / MDM  EKG:   I have reviewed the labs performed to date as well as medications administered while in observation.  Recent changes in the last 24 hours include -episode of agitation yesterday.  TOC hold, seeking placement.  Not appropriate for skilled nursing facility it seems like.  Plan  Current plan is for holding for placement.Derwood Kaplan, MD 02/08/23 1123   2:49 PM Psych reassessed. 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 3 times daily -Recommend continue safety precautions -Recommend repeat ECG for QTC monitoring  -Recommend restart Ativan but with dose modification at this time to 0.5 mg p.o. 3 times daily (down from 1mg  p.o. TID)  -Recommend low-dose/low potency antipsychotics i.e. olanzapine if needed as needed -Recommend continue olanzapine as scheduled    Derwood Kaplan, MD 02/08/23 1450

## 2023-02-08 NOTE — ED Notes (Signed)
Dinner tray provided

## 2023-02-09 DIAGNOSIS — R451 Restlessness and agitation: Principal | ICD-10-CM

## 2023-02-09 NOTE — ED Provider Notes (Signed)
Emergency Medicine Observation Re-evaluation Note  Natalie Brown is a 57 y.o. female, seen on rounds today.  Pt initially presented to the ED for complaints of Agitation Currently, the patient is resting.  She was noted to be drooling and there was a tremor.  Patient withdraws when I try to assess her.  At baseline she does not speak.  Serial indicates that patient has done similar tremor/shaking yesterday and has been doing that a couple of times a day, and this spontaneously cease.  Physical Exam  BP 115/62 (BP Location: Right Arm)   Pulse 78   Temp 98.9 F (37.2 C) (Oral)   Resp 15   Ht 5\' 6"  (1.676 m)   Wt 46 kg   SpO2 100%   BMI 16.37 kg/m  Physical Exam General: Shaking noted, no distress Cardiac: Regular rate Lungs: No respiratory distress Psych: Currently calm  ED Course / MDM  EKG:EKG Interpretation Date/Time:  Saturday February 08 2023 14:48:01 EDT Ventricular Rate:  96 PR Interval:  130 QRS Duration:  68 QT Interval:  342 QTC Calculation: 432 R Axis:   79  Text Interpretation: Normal sinus rhythm Normal ECG When compared with ECG of 23-Nov-2022 04:13, PREVIOUS ECG IS PRESENT Confirmed by Kommor, [name] ([id number]) on 02/09/2023 11:46:25 AM  I have reviewed the labs performed to date as well as medications administered while in observation.  Recent changes in the last 24 hours include -no new changes.  Plan  The shaking episode did have me look into patient's previous history.  She has had 2 EEGs which were negative.  She has been assessed for tremor and has a diagnosis of drooling.  The tremor is thought to be possibly medication side effect versus behavior.  It did appear behavioral to me as patient was withdrawing.  The whole body was shaking and this was not a partial seizure.  Advised considered to inform me again if the episode worsens or returns  Current plan is for patient to be held in the ED for placement.    Derwood Kaplan, MD 02/09/23 1155

## 2023-02-09 NOTE — Progress Notes (Addendum)
[facility] Psych ED Progress Note  02/09/2023 11:47 AM Natalie Brown  MRN:  [id number]   Subjective:   Natalie Brown is a 57 y.o. AA female with a past psychiatric/medical history of early onset dementia, bipolar affective disorder, catatonia, vitamin B12 deficiency, CKD stage IIIa, hypertension, and severe protein calorie malnutrition, who is currently a border within the emergency department while placement at an alternative living facility is obtained, due to the patient reportedly upon transfer and discharge from [facility] to her new living arrangements, had an anxiety/stress reaction in route, which led to the patient being denied upon arrival and sent back to the emergency department for safety and continuation of care.    Patient prior to attempted discharged has appreciably been within the [facility] health system's care from 12/24/2021- current.  Patient was originally admitted medically on 12/24/2021 due to possible catatonia/altered mental status from the behavioral health hospital (later diagnosed with early onset dementia).  Patient seen today at the [facility] emergency department for face-to-face psychiatric reevaluation.  Upon reevaluation, engagement like previous evaluation yesterday, largely characterized by atypical interpersonal style, complete refusal and/or inability to participate in any meaningful physical examination and/or mental status examination, restricted affect, poor eye contact, and with notable rocking back and forth that appears to continue to become appreciable upon evaluation by EDP team's i.e. medical and psychiatry.  Notable rocking back and forth appears behavioral in nature versus of a medical etiology.    Patient during evaluation does not appear to be responding to internal stimuli, and or in any physical distress and/or pain, and does not appear to be presenting with any fluctuations of consciousness and/or concerns for seizure activity.  Patient continues to appear  cachectic and malnourished, though appreciably per nursing eats very well and sleeps adequately.  Per nursing:   Patient is compliant with medications (crushed with food), eats well, sleeping normally.  Patient remains unsteady and needs assistance for care measures.  Patient has not had any behavioral incidents and/or incidents of increased aggression and or agitations. Staff feels medication adjustments are helpful.   Principal Problem: Dementia with behavioral disturbance (HCC) Diagnosis:  Principal Problem:   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) Active Problems:   Bipolar 1 disorder (HCC)   Agitation   ED Assessment Time Calculation: Start Time: 1130 Stop Time: 1145 Total Time in Minutes (Assessment Completion): 15   Past Psychiatric History: As below   Per Dr. Betsey Holiday note 01/20/2023   Information collected from chart. Bipolar affective disorder with psychotic features, MNCD Admitted to [facility] since Sep 06, 2021  [place] Scale:  Flowsheet Row ED from 01/22/2023 in [facility] Emergency Department at [facility] ED to Hosp-Admission (Discharged) from 12/24/2021 in [facility] ED to Hosp-Admission (Discharged) from 09/06/2021 in [facility] Emergency Department at [facility]  C-SSRS RISK CATEGORY No Risk No Risk No Risk       Past Medical History:  Past Medical History:  Diagnosis Date   Bipolar 1 disorder with moderate mania (HCC) 12/30/2013   Hypertension    History reviewed. No pertinent surgical history. Family History: History reviewed. No pertinent family history. Family Psychiatric  History:   Per Dr. Betsey Holiday note 01/20/2023   Family psych history: Patient reports mental illness runs in her father's side of the family.  Social History:  Social History   Substance and Sexual Activity  Alcohol Use No     Social History   Substance and Sexual  Activity  Drug Use No    Social History    Socioeconomic History   Marital status: Married    Spouse name: Not on file   Number of children: Not on file   Years of education: Not on file   Highest education level: Not on file  Occupational History   Not on file  Tobacco Use   Smoking status: Every Day   Smokeless tobacco: Current  Substance and Sexual Activity   Alcohol use: No   Drug use: No   Sexual activity: Not on file  Other Topics Concern   Not on file  Social History Narrative   Not on file   Social Determinants of Health   Financial Resource Strain: Not on file  Food Insecurity: No Food Insecurity (01/24/2023)   Hunger Vital Sign    Worried About Running Out of Food in the Last Year: Never true    Ran Out of Food in the Last Year: Never true  Transportation Needs: Patient Unable To Answer (01/24/2023)   PRAPARE - Administrator, Civil Service (Medical): Patient unable to answer    Lack of Transportation (Non-Medical): Patient unable to answer  Physical Activity: Not on file  Stress: Not on file  Social Connections: Not on file    Sleep: Fair  Appetite:  Fair  Current Medications: Current Facility-Administered Medications  Medication Dose Route Frequency Provider Last Rate Last Admin   (feeding supplement) PROSource Plus liquid 30 mL  30 mL Oral BID BM Rondel Baton, MD   30 mL at 02/08/23 1303   feeding supplement (ENSURE ENLIVE / ENSURE PLUS) liquid 237 mL  237 mL Oral BID BM Rondel Baton, MD   237 mL at 02/07/23 1416   lactulose (CHRONULAC) 10 GM/15ML solution 20 g  20 g Oral BID PRN Rondel Baton, MD       LORazepam (ATIVAN) tablet 0.5 mg  0.5 mg Oral TID Lenox Ponds, NP   0.5 mg at 02/09/23 1111   multivitamin with minerals tablet 1 tablet  1 tablet Oral Daily Rondel Baton, MD   1 tablet at 02/09/23 1111   OLANZapine (ZYPREXA) tablet 2.5 mg  2.5 mg Oral QHS Cathren Laine, MD   2.5 mg at 02/06/23 2120   polyethylene glycol (MIRALAX / GLYCOLAX) packet 17 g  17  g Oral Daily Rondel Baton, MD   17 g at 02/09/23 1110   Current Outpatient Medications  Medication Sig Dispense Refill   Cyanocobalamin (B-12 COMPLIANCE INJECTION IJ) Inject as directed.     feeding supplement (ENSURE ENLIVE / ENSURE PLUS) LIQD Take 237 mLs by mouth 2 (two) times daily between meals.     lactulose (CHRONULAC) 10 GM/15ML solution Take 30 mLs (20 g total) by mouth 2 (two) times daily as needed for mild constipation.     LORazepam (ATIVAN) 2 MG/ML concentrated solution Take 0.5 mLs (1 mg total) by mouth 3 (three) times daily. 3 mL 0   Multiple Vitamin (MULTIVITAMIN WITH MINERALS) TABS tablet Take 1 tablet by mouth daily.     Nutritional Supplements (,FEEDING SUPPLEMENT, PROSOURCE PLUS) liquid Take 30 mLs by mouth 2 (two) times daily between meals.     polyethylene glycol (MIRALAX / GLYCOLAX) 17 g packet Take 17 g by mouth daily. (Patient taking differently: Take 17 g by mouth daily as needed for mild constipation.) 14 each 0   Incontinence Supply Disposable (BRIEFS OVERNIGHT MEDIUM) MISC 1 each by Does not apply  route every 4 (four) hours as needed (soiling). 100 each 2    Lab Results:  Results for orders placed or performed during the hospital encounter of 01/22/23 (from the past 48 hour(s))  Comprehensive metabolic panel     Status: Abnormal   Collection Time: 02/08/23  2:33 PM  Result Value Ref Range   Sodium 138 135 - 145 mmol/L   Potassium 4.1 3.5 - 5.1 mmol/L   Chloride 104 98 - 111 mmol/L   CO2 26 22 - 32 mmol/L   Glucose, Bld 82 70 - 99 mg/dL    Comment: Glucose reference range applies only to samples taken after fasting for at least 8 hours.   BUN 20 6 - 20 mg/dL   Creatinine, Ser 8.65 (H) 0.44 - 1.00 mg/dL   Calcium 8.9 8.9 - 78.4 mg/dL   Total Protein 6.2 (L) 6.5 - 8.1 g/dL   Albumin 2.9 (L) 3.5 - 5.0 g/dL   AST 28 15 - 41 U/L   ALT 20 0 - 44 U/L   Alkaline Phosphatase 58 38 - 126 U/L   Total Bilirubin 0.5 0.3 - 1.2 mg/dL   GFR, Estimated >69 >62  mL/min    Comment: (NOTE) Calculated using the CKD-EPI Creatinine Equation (2021)    Anion gap 8 5 - 15    Comment: Performed at [facility], [address] [place], [place] [address]  CBC with Differential     Status: None   Collection Time: 02/08/23  2:33 PM  Result Value Ref Range   WBC 5.1 4.0 - 10.5 K/uL   RBC 4.08 3.87 - 5.11 MIL/uL   Hemoglobin 12.1 12.0 - 15.0 g/dL   HCT 13.2 44.0 - 10.2 %   MCV 90.7 80.0 - 100.0 fL   MCH 29.7 26.0 - 34.0 pg   MCHC 32.7 30.0 - 36.0 g/dL   RDW 72.5 36.6 - 44.0 %   Platelets 291 150 - 400 K/uL   nRBC 0.0 0.0 - 0.2 %   Neutrophils Relative % 59 %   Neutro Abs 3.0 1.7 - 7.7 K/uL   Lymphocytes Relative 28 %   Lymphs Abs 1.4 0.7 - 4.0 K/uL   Monocytes Relative 8 %   Monocytes Absolute 0.4 0.1 - 1.0 K/uL   Eosinophils Relative 4 %   Eosinophils Absolute 0.2 0.0 - 0.5 K/uL   Basophils Relative 1 %   Basophils Absolute 0.1 0.0 - 0.1 K/uL   Immature Granulocytes 0 %   Abs Immature Granulocytes 0.01 0.00 - 0.07 K/uL    Comment: Performed at [facility], [address], [place], [place] [address]    Blood Alcohol level:  Lab Results  Component Value Date   ETH <10 09/05/2021    Physical Findings:  CIWA:    COWS:     Musculoskeletal: Strength & Muscle Tone: decreased and atrophy Gait & Station: unsteady Patient leans: N/A  Psychiatric Specialty Exam:  Presentation  General Appearance:  Other (comment) (jerking motion, in fetal position, appropriately clothed and covered)  Eye Contact: Fleeting  Speech: Blocked  Speech Volume: Other (comment) (Nonverbal)  Handedness: Right   Mood and Affect  Mood: -- (Nonverbal)  Affect: Restricted   Thought Process  Thought Processes: Other (comment) (Nonverbal)  Descriptions of Associations:-- (Nonverbal)  Orientation:Other (comment) (Nonverbal)  Thought Content:Other (comment) (Nonverbal)  History of Schizophrenia/Schizoaffective  disorder:Yes  Duration of Psychotic Symptoms:Greater than six months  Hallucinations:Hallucinations: Other (comment) (None appreciated, nonverbal)  Ideas of Reference:Other (  comment) (Nonverbal)  Suicidal Thoughts:Suicidal Thoughts: -- (Nonverbal)  Homicidal Thoughts:Homicidal Thoughts: -- English as a second language teacher)   Sensorium  Memory: Other (comment) (Nonverbal)  Judgment: Impaired  Insight: Lacking   Executive Functions  Concentration: Poor  Attention Span: Poor  Recall: Poor  Fund of Knowledge: Poor  Language: Poor   Psychomotor Activity  Psychomotor Activity: Psychomotor Activity: Other (comment) (restless, jerking motion in fetal position, refuses physical exam)   Assets  Assets: Resilience; Social Support   Sleep  Sleep: Sleep: -- (Nonverbal)    Physical Exam: Physical Exam Vitals and nursing note reviewed. Exam conducted with a chaperone present.  Constitutional:      Appearance: [name] ill-appearing.     Comments: Cachectic    Pulmonary:     Effort: Pulmonary effort is normal.  Skin:    General: Skin is warm and dry.  Psychiatric:        Attention and Perception: [name] inattentive.        Mood and Affect: Affect is flat.        Speech: [name] noncommunicative.        Behavior: Behavior is not agitated, aggressive, hyperactive or combative.        Cognition and Memory: Cognition is impaired. Memory is impaired.        Judgment: Judgment is impulsive and inappropriate.    Review of Systems  Unable to perform ROS: Dementia   Blood pressure 115/62, pulse 78, temperature 98.9 F (37.2 C), temperature source Oral, resp. rate 15, height 5\' 6"  (1.676 m), weight 46 kg, SpO2 100%. Body mass index is 16.37 kg/m.   Medical Decision Making:  Upon evaluation, patient continues to present with significant and limited ability to participate in any meaningful assessment (consistent with chronic illness coarse of dementia); however, per nursing, patient  has not had any behavioral incidents lately, has not had any recrudescences of agitations and aggressions, and appears to be doing better with adjustments to the patient's medication regimen at this time.  Psychiatry will continue to follow the patient at this time, but if continues to show improvements in behavior disturbances and agitations and aggressions, will likely sign out in a few days back to EDP team while patient awaits placement in assisted living.   Recommendations   #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) #Bipolar 1 disorder (HCC)   -Recommend continue safety precautions -ECG for QTC monitoring appreciably 432 (normal) -Blood work 02/08/2023 largely unremarkable -Recommend continue scheduled medications   Lenox Ponds, NP 02/09/2023, 11:47 AM

## 2023-02-10 NOTE — ED Notes (Signed)
Bed alarm sounding in room, pt noted out of bed into visiting chair in room. Pt assisted back to bed with help of Engineer, materials. Voiced to pt to remain in bed. Bed alarm intact, bed in lowest position.

## 2023-02-10 NOTE — ED Provider Notes (Signed)
Emergency Medicine Observation Re-evaluation Note  Natalie Brown is a 57 y.o. female, seen on rounds today.  Pt initially presented to the ED for complaints of Agitation Currently, the patient is resting comfortably in ED bed.  Physical Exam  BP 108/60 (BP Location: Right Arm)   Pulse 70   Temp 97.8 F (36.6 C) (Axillary)   Resp 17   Ht 5\' 6"  (1.676 m)   Wt 46 kg   SpO2 99%   BMI 16.37 kg/m  Physical Exam General: Awake. Alert. No acute distress Cardiac: Regular rate rhythm Lungs: Clear to auscultation bilaterally Psych: Calm and cooperative  ED Course / MDM  EKG:EKG Interpretation Date/Time:  Saturday February 08 2023 14:48:01 EDT Ventricular Rate:  96 PR Interval:  130 QRS Duration:  68 QT Interval:  342 QTC Calculation: 432 R Axis:   79  Text Interpretation: Normal sinus rhythm Normal ECG When compared with ECG of 23-Nov-2022 04:13, PREVIOUS ECG IS PRESENT Confirmed by Kommor, [name] ([id number]) on 02/09/2023 11:46:25 AM  I have reviewed the labs performed to date as well as medications administered while in observation.  Recent changes in the last 24 hours include: None.  Plan  Current plan is for continued observation and boarding in the ED awaiting disposition.    Royanne Foots, DO 02/10/23 2104

## 2023-02-10 NOTE — ED Notes (Signed)
Pt sleeping at this time, NAD noted at this time, pt resting in bed, respirations even and unlabored, skin warm and dry, bed in low position. Will continue to monitor.

## 2023-02-10 NOTE — ED Notes (Signed)
Bed alarm sounding, pt noted out of bed. Pt assisted back to bed with help of sitter. Pt incontinent of urine at this time. Perineal care provided at this time

## 2023-02-10 NOTE — Progress Notes (Signed)
No new updates. Patients mom is currently searching for housing options for patient.

## 2023-02-10 NOTE — Consult Note (Addendum)
  Per chart review, appears psychiatry has been following up with patient to help manage behaviors related to dementia diagnosis.   Per chart review, appears patient has not had any recent behavioral incidents or episodes of increased aggression/agitation since medication changes. Staff feels medication adjustments have been helpful. Will remove patient from TTS list, TOC and patient's mom are currently working on finding placement for patient.

## 2023-02-10 NOTE — ED Notes (Signed)
Assumed care of patient, NAD noted, pt is sleeping at this time.

## 2023-02-11 MED ORDER — LORAZEPAM 0.5 MG PO TABS
0.5000 mg | ORAL_TABLET | Freq: Once | ORAL | Status: AC
  Administered 2023-02-11: 0.5 mg via ORAL
  Filled 2023-02-11: qty 1

## 2023-02-11 MED ORDER — LORAZEPAM 1 MG PO TABS: 1.0000 mg | ORAL_TABLET | Freq: Once | ORAL | Status: DC

## 2023-02-11 NOTE — ED Notes (Signed)
Pt remains asleep, NAD noted at this time, pt resting in bed, respirations even and unlabored, skin warm and dry. Will continue to monitor.

## 2023-02-11 NOTE — ED Notes (Signed)
Pt awake, eating food. Pt cleaned and brief changed.

## 2023-02-11 NOTE — ED Notes (Signed)
Pt had BM, pt cleaned, bed linens changed. Warm blankets given.

## 2023-02-11 NOTE — Progress Notes (Signed)
Patient has an assessment today at 1:00 PM to determine her eligibility for personal care services in the home. Patients mother will be present.

## 2023-02-11 NOTE — ED Provider Notes (Signed)
Emergency Medicine Observation Re-evaluation Note  BULEAH EFFRON is a 57 y.o. female, seen on rounds today.  Pt initially presented to the ED for complaints of Agitation Currently, the patient is sleeping.  Physical Exam  BP (!) 83/58 (BP Location: Left Arm)   Pulse 63   Temp (!) 97.5 F (36.4 C) (Oral)   Resp 15   Ht 5\' 6"  (1.676 m)   Wt 46 kg   SpO2 99%   BMI 16.37 kg/m  Physical Exam General: calm Cardiac: regular rate Lungs: equal chest rise Psych: sleeping  ED Course / MDM  EKG:EKG Interpretation Date/Time:  Saturday February 08 2023 14:48:01 EDT Ventricular Rate:  96 PR Interval:  130 QRS Duration:  68 QT Interval:  342 QTC Calculation: 432 R Axis:   79  Text Interpretation: Normal sinus rhythm Normal ECG When compared with ECG of 23-Nov-2022 04:13, PREVIOUS ECG IS PRESENT Confirmed by Kommor, [name] ([id number]) on 02/09/2023 11:46:25 AM  I have reviewed the labs performed to date as well as medications administered while in observation.  Recent changes in the last 24 hours include briefly needed soft restraints but were able to be removed.  Plan  Current plan is for placement.    Lonell Grandchild, MD 02/11/23 0900

## 2023-02-11 NOTE — Progress Notes (Signed)
Patients 1915 assessment was completed today. Patients case worker with [facility] [employer] [facility] Alycia Rossetti stated she will send CSW consent papers for CSW and patients mother to sign.

## 2023-02-12 MED ORDER — LORAZEPAM 1 MG PO TABS
1.0000 mg | ORAL_TABLET | Freq: Once | ORAL | Status: AC
  Administered 2023-02-12: 1 mg via ORAL
  Filled 2023-02-12: qty 1

## 2023-02-12 NOTE — Progress Notes (Signed)
CSW contacted patients case worker Natalie Brown [phone] with [employer] [facility]. CSW advised Natalie Brown that CSW didn't receive the consent paperwork yesterday, [name] stated she would E-mail that paperwork today. Patients mother will need to sign the consent forms to process the request for personal care services.    Patients mother at this time hasn't located an apartment for patient.

## 2023-02-12 NOTE — ED Notes (Signed)
Patient is awake, noted sheets were wet, diaper wet, changed patient diaper and new sheets provided. Food was delivered to patient. She eating with some assist (apple sauce, green beans, mashed potato , [place] and gravy) water and juice provided as well. Patient took her night time meds without a problem. Patient is very shaky, and probably unsteady on her feet. Somewhat easy to redirect.

## 2023-02-12 NOTE — ED Notes (Signed)
Incontinence care provided for pt.  Family member now at bedside sitting with patient.

## 2023-02-12 NOTE — ED Notes (Signed)
Restraints discontinued at this time due to pt condition. Pt asleep in NAD. Respirations even and non-labored.

## 2023-02-12 NOTE — ED Notes (Signed)
Pt brief dry at this time 

## 2023-02-12 NOTE — ED Provider Notes (Signed)
Emergency Medicine Observation Re-evaluation Note  Natalie Brown is a 57 y.o. female, seen on rounds today.  Pt initially presented to the ED for complaints of episodes of agitated behavior and needing place to live. TOC continues to work on [facility]. No new c/o this AM. Pt is eating breakfast.   Physical Exam  BP (!) 131/98 (BP Location: Right Arm)   Pulse (!) 102   Temp 98.5 F (36.9 C) (Oral)   Resp 20   Ht 1.676 m (5\' 6" )   Wt 46 kg   SpO2 100%   BMI 16.37 kg/m  Physical Exam General: alert, content.  Cardiac: regular rate.  Lungs: breathing comfortably. Psych: calm, eating breakfast.   ED Course / MDM    I have reviewed the labs performed to date as well as medications administered while in observation.  Recent changes in the last 24 hours include ED obs, reassessment.   Plan  TOC team continues to work on placement.     Cathren Laine, MD 02/12/23 [phone]

## 2023-02-12 NOTE — ED Notes (Signed)
Pt continues to sleep at this time. Pt repositioned without wakening. Pt respirations even and non-labored. Pt in NAD. Medications held at this time due to pt condition.

## 2023-02-12 NOTE — ED Notes (Signed)
Pt asleep at this time. Attempted to wake pt, without success. Pt respirations even and non-labored. Pt in NAD.

## 2023-02-12 NOTE — ED Notes (Signed)
Notified EDP about pt agitation and rocking back and forth a lot. Pt unable to sit still at all.

## 2023-02-13 NOTE — ED Notes (Signed)
Per charge [employer]. RN no certain visitor hours for pt. Mother made aware as well.

## 2023-02-13 NOTE — ED Provider Notes (Signed)
Emergency Medicine Observation Re-evaluation Note  Natalie Brown is a 57 y.o. female, seen on rounds today.  Pt initially presented to the ED for complaints of Agitation Currently, the patient is not having any acute complaints.  Physical Exam  BP (!) 123/98 (BP Location: Left Arm)   Pulse 94   Temp 98.5 F (36.9 C) (Oral)   Resp 16   Ht 5\' 6"  (1.676 m)   Wt 46 kg   SpO2 99%   BMI 16.37 kg/m  Physical Exam General: Resting comfortably in stretcher Lungs: Normal work of breathing Psych: Calm  ED Course / MDM  EKG:EKG Interpretation Date/Time:  Saturday February 08 2023 14:48:01 EDT Ventricular Rate:  96 PR Interval:  130 QRS Duration:  68 QT Interval:  342 QTC Calculation: 432 R Axis:   79  Text Interpretation: Normal sinus rhythm Normal ECG When compared with ECG of 23-Nov-2022 04:13, PREVIOUS ECG IS PRESENT Confirmed by Kommor, [name] ([id number]) on 02/09/2023 11:46:25 AM  I have reviewed the labs performed to date as well as medications administered while in observation.  Recent changes in the last 24 hours include no acute events.  Plan  Current plan is for placement.    Rondel Baton, MD 02/13/23 [phone]

## 2023-02-13 NOTE — ED Notes (Signed)
Pt given warm blanket. Music playing in room for pt comfort.

## 2023-02-13 NOTE — ED Notes (Signed)
Pt mother at bedside

## 2023-02-13 NOTE — ED Notes (Signed)
Mother at bedside.

## 2023-02-13 NOTE — ED Notes (Signed)
Patient was moving around in bed too much, looked uncomfortable. Checked diaper, she had a small bowel movement, cleaned her and changed diaper.

## 2023-02-13 NOTE — Progress Notes (Signed)
Occupational Therapy Treatment and Discharge Patient Details Name: Natalie Brown MRN: [id number] DOB: 20-Nov-1965 Today's Date: 02/13/2023   History of present illness Pt is a 57 y.o. female who presented back to [facility] 01/22/23 after the facility she discharged to earlier that day declined her as pt became combative and facility reported they were unable to provide care for her. Of note, was admitted 12/24/21-01/22/23 before discharging to that facility. Admitted to [facility] on 09/07/2021 for catatonia treatment, arrived from [facility] on 12/24/21 for tachycardia, covid + 01/12/22, Fall on 08/26/2022. 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   OT comments  This 57 yo female seen today to readdress goals--pt has not made any progress since initial evaluation due to cognition and due to this OT will sign off.      If plan is discharge home, recommend the following:  Two people to help with walking and/or transfers;A lot of help with bathing/dressing/bathroom;Assistance with cooking/housework;Direct supervision/assist for medications management;Help with stairs or ramp for entrance;Assist for transportation;Direct supervision/assist for financial management;Assistance with feeding   Equipment Recommendations  None recommended by OT       Precautions / Restrictions Precautions Precautions: Fall Restrictions Weight Bearing Restrictions: No       Mobility Bed Mobility               General bed mobility comments: can move about the bed without issues    Transfers Overall transfer level: Needs assistance                 General transfer comment: per staff unit pt can get up and walk with S         ADL either performed or assessed with clinical judgement   ADL                                         General ADL Comments: Has the potential movement to do ADLs but is limited by cognition     Extremity/Trunk Assessment Upper Extremity Assessment Upper Extremity Assessment:  (moving all 4 extremities without issues\)            Vision   Additional Comments: vision unknown--due to pt cannot answer questions nor following commands          Cognition Arousal: Alert Behavior During Therapy: Flat affect, Impulsive Overall Cognitive Status: History of cognitive impairments - at baseline                                 General Comments: non-verbal,  not following any commands                   Pertinent Vitals/ Pain       Pain Assessment Pain Assessment: Faces Faces Pain Scale: No hurt         Frequency  Min 1X/week        Progress Toward Goals  OT Goals(current goals can now be found in the care plan section)  Progress towards OT goals: Not progressing toward goals - comment (due to cognition;pt not making gains since evaluation)  Acute Rehab OT Goals OT Goal Formulation: Patient unable to participate in goal setting  Plan Discharge plan remains appropriate       AM-PAC OT "6 Clicks" Daily Activity  Outcome Measure   Help from another person eating meals?: Total Help from another person taking care of personal grooming?: Total Help from another person toileting, which includes using toliet, bedpan, or urinal?: Total Help from another person bathing (including washing, rinsing, drying)?: Total Help from another person to put on and taking off regular upper body clothing?: Total Help from another person to put on and taking off regular lower body clothing?: Total 6 Click Score: 6    End of Session    OT Visit Diagnosis: Other symptoms and signs involving the nervous system (R29.898);Other symptoms and signs involving cognitive function;Cognitive communication deficit (R41.841) Symptoms and signs involving cognitive functions:  (dementia)   Activity Tolerance  (limited by cognition)   Patient Left in bed;with bed alarm  set           Time: 1011-1019 OT Time Calculation (min): 8 min  Charges: OT General Charges $OT Visit: 1 Visit OT Treatments $Self Care/Home Management : 8-22 mins  Lindon Romp OT Acute Rehabilitation Services Office [phone]    Evette Georges 02/13/2023, 11:03 AM

## 2023-02-13 NOTE — ED Notes (Signed)
Pt got out of bed and ambulated out of her room and back to bed.

## 2023-02-13 NOTE — Progress Notes (Signed)
CSW provided patients mother with the authorization to disclose health information paperwork that was sent to CSW from patients case worker Natalie Brown with [facility] recovery services. CSW advised patients mother to read over the forms prior to signing. Patients mother stated it was too much to read and if it will help patient she will sign.    [name] stated that her supervisor advised her to search for memory care facilities if personal care services cannot be offered.

## 2023-02-14 MED ORDER — ENSURE ENLIVE PO LIQD
237.0000 mL | Freq: Two times a day (BID) | ORAL | Status: DC
  Administered 2023-02-15 – 2024-01-19 (×503): 237 mL via ORAL
  Filled 2023-02-14 (×31): qty 237

## 2023-02-14 MED ORDER — ZIPRASIDONE MESYLATE 20 MG IM SOLR
20.0000 mg | Freq: Once | INTRAMUSCULAR | Status: AC
  Administered 2023-02-14: 20 mg via INTRAMUSCULAR
  Filled 2023-02-14: qty 20

## 2023-02-14 MED ORDER — STERILE WATER FOR INJECTION IJ SOLN
INTRAMUSCULAR | Status: AC
  Administered 2023-02-14: 1.2 mL
  Filled 2023-02-14: qty 10

## 2023-02-14 NOTE — ED Notes (Signed)
Pt released from restraints at this time. Pt is sleeping and not pulling at restraints. Pts observer remains at bedside to monitor patient. Pts bed alarm is on.

## 2023-02-14 NOTE — ED Notes (Signed)
Pt agitated and restless.

## 2023-02-14 NOTE — ED Notes (Signed)
Pt agitated and restless, pulling at restraints and attempting to get out of bed. Pts mother at bedside. Pt repositioned by this RN & patient observer.

## 2023-02-14 NOTE — ED Provider Notes (Signed)
Emergency Medicine Observation Re-evaluation Note  Natalie Brown is a 57 y.o. female, seen on rounds today.  Pt initially presented to the ED for complaints of Agitation Currently, the patient is resting in bed This is a 57 year old female with dementia who was inpatient for over a year.  She was discharged to facility but never made it off the EMS stretcher and was returned to the ED and has been here now for 545 hours.  Physical Exam  BP 138/74 (BP Location: Left Arm)   Pulse (!) 122   Temp 97.8 F (36.6 C) (Oral)   Resp 20   Ht 1.676 m (5\' 6" )   Wt 46 kg   SpO2 100%   BMI 16.37 kg/m  Physical Exam General: Chronically ill-appearing female laying in bed Cardiac: Tachycardia noted Lungs: Respiratory rate normal with normal oxygen saturation Psych: Patient is sleeping on my initial evaluation but opens her eyes but is unresponsive to my questions.  ED Course / MDM  EKG:EKG Interpretation Date/Time:  Saturday February 08 2023 14:48:01 EDT Ventricular Rate:  96 PR Interval:  130 QRS Duration:  68 QT Interval:  342 QTC Calculation: 432 R Axis:   79  Text Interpretation: Normal sinus rhythm Normal ECG When compared with ECG of 23-Nov-2022 04:13, PREVIOUS ECG IS PRESENT Confirmed by Kommor, [name] ([id number]) on 02/09/2023 11:46:25 AM  I have reviewed the labs performed to date as well as medications administered while in observation.  Recent changes in the last 24 hours include reviewed OT note that states 2 people to help with walking and/or transfers a lot of help with bathing dressing no equipment recommendations and OT is signing off due to cognition. Reviewed clinical social worker note states that mother was at bedside yesterday when authorized disclosure of health information paperwork  Plan  Current plan is for attempting to find memory care unit.    Margarita Grizzle, MD 02/14/23 [phone]

## 2023-02-14 NOTE — ED Notes (Signed)
Patient is continuing to escalate in behavior and showing anxiety; EDP notified for [facility]

## 2023-02-14 NOTE — ED Notes (Signed)
Pt's mother visiting at this time 

## 2023-02-14 NOTE — Progress Notes (Signed)
CSW spoke with patients mother at bedside. Patients mother completed an application at [employer]. Patients mother plans to submit another application at [address] [facility]. Personal care services request has been sent to patients case worker and is currently pending.

## 2023-02-14 NOTE — ED Notes (Signed)
Pt assessed by MD Andria Meuse. Order for nonviolent bilateral wrist and ankle soft restraints renewed due to pts continued agitation. Mother remains at bedside and agreeable to plan.

## 2023-02-14 NOTE — ED Notes (Signed)
Pts brief changed and pt cleansed of urine, peri care provided. Pts gown and blankets changed. Pt repositioned. No needs identified at this time. Light dimmed in room.

## 2023-02-15 MED ORDER — HYDROXYZINE HCL 50 MG PO TABS
50.0000 mg | ORAL_TABLET | Freq: Three times a day (TID) | ORAL | Status: DC | PRN
  Administered 2023-02-23 – 2023-03-10 (×14): 50 mg via ORAL
  Filled 2023-02-15 (×16): qty 1

## 2023-02-15 NOTE — ED Notes (Signed)
Pts brief changed and peri care performed.

## 2023-02-15 NOTE — ED Notes (Signed)
Pt awake and becoming restless and agitated again. Pt requested water, provided water. Pt covered with warm blankets and repositioned. No further needs expressed. Pt continues to shake and rock back and forth intermittently. Pt observer remains at bedside. Bed alarms set.

## 2023-02-15 NOTE — ED Provider Notes (Signed)
Emergency Medicine Observation Re-evaluation Note  Natalie Brown is a 57 y.o. female, seen on rounds today.  Pt initially presented to the ED for complaints of Agitation Currently, the patient is resting comfortably.  Physical Exam  BP 103/60   Pulse 81   Temp 98.3 F (36.8 C) (Axillary)   Resp 18   Ht 5\' 6"  (1.676 m)   Wt 46 kg   SpO2 100%   BMI 16.37 kg/m  Physical Exam General: No acute distress Cardiac: Regular rate Lungs: No respiratory distress Psych: Resting comfortably  ED Course / MDM  EKG:EKG Interpretation Date/Time:  Saturday February 08 2023 14:48:01 EDT Ventricular Rate:  96 PR Interval:  130 QRS Duration:  68 QT Interval:  342 QTC Calculation: 432 R Axis:   79  Text Interpretation: Normal sinus rhythm Normal ECG When compared with ECG of 23-Nov-2022 04:13, PREVIOUS ECG IS PRESENT Confirmed by Kommor, [name] ([id number]) on 02/09/2023 11:46:25 AM  I have reviewed the labs performed to date as well as medications administered while in observation.  Recent changes in the last 24 hours include that the patient did require restraints overnight.  Plan  Current plan is for continued observation in the hopes for placement in a memory care unit.    Durwin Glaze, MD 02/15/23 1126

## 2023-02-15 NOTE — ED Notes (Signed)
Patient restraint was placed back on at 1022. It was take off by the night nurse. Patient continue to climb over the rail, and coming out her room.

## 2023-02-15 NOTE — ED Notes (Signed)
Patient was given breakfast this morning.

## 2023-02-15 NOTE — ED Notes (Signed)
Pt now sleeping. Symmetrical chest rise & fall noted. No needs identified at this time. Side rails up, bed alarm on, observer at bedside for pt safety.

## 2023-02-15 NOTE — ED Notes (Signed)
Pt noted to be becoming restless. Pt cleansed of stool and urine. Peri care provided and brief changed. Pt became agitated and agressive moving limbs to avoid turning/ brief change. Pt covered with blankets and repositioned with head on pillow. Pt more calm with lights dimmed and staff outside the room but remains rocking back and forth and anxious appearing. Pt remains out of restraints at this time with observer at bedside and bed alarm on.

## 2023-02-15 NOTE — ED Notes (Addendum)
Pt becoming increasingly agitated. Pt continuously rocking back and forth, turning over in bed, pt becoming more difficult to redirect per patient observer. Pts brief is dry, pt was offered food and drink and warm blankets, stimulation decreased and lights dimmed in room in attempts to calm patient. Concerns discussed with MD Bebe Shaggy who gave verbal order to give pts 0.5mg  PO ativan early at this time. PO ativan given per MAR. 10 am dose marked for hold as pt received dose early.

## 2023-02-15 NOTE — ED Notes (Signed)
Patient hospital gown and bed was changed at 10:22 am

## 2023-02-16 NOTE — ED Notes (Signed)
Pt's brief and linens checked at this time; pt currently clean and dry.

## 2023-02-16 NOTE — ED Notes (Signed)
Patient restraint was taken off at 1722, patient is asleep

## 2023-02-16 NOTE — ED Provider Notes (Signed)
Emergency Medicine Observation Re-evaluation Note  Natalie Brown is a 57 y.o. female, seen on rounds today.  Pt initially presented to the ED for complaints of Agitation Currently, the patient is sleeping.  Physical Exam  BP 120/81 (BP Location: Right Arm)   Pulse (!) 108   Temp 98.2 F (36.8 C) (Oral)   Resp 18   Ht 5\' 6"  (1.676 m)   Wt 46 kg   SpO2 100%   BMI 16.37 kg/m  Physical Exam General: No acute distress Lungs: No respiratory distress Psych: Sleeping  ED Course / MDM  EKG:EKG Interpretation Date/Time:  Saturday February 08 2023 14:48:01 EDT Ventricular Rate:  96 PR Interval:  130 QRS Duration:  68 QT Interval:  342 QTC Calculation: 432 R Axis:   79  Text Interpretation: Normal sinus rhythm Normal ECG When compared with ECG of 23-Nov-2022 04:13, PREVIOUS ECG IS PRESENT Confirmed by Kommor, [name] ([id number]) on 02/09/2023 11:46:25 AM  I have reviewed the labs performed to date as well as medications administered while in observation.  Recent changes in the last 24 hours include none.  Plan  Current plan is for social work to continue to work on placement or safe disposition for the patient.    Durwin Glaze, MD 02/16/23 [phone]

## 2023-02-16 NOTE — ED Notes (Signed)
Pt received for care at 1900.  At this time, pt asleep in bed; respirations even and unlabored with no distress noted.  Pt not currently in restraints.  Pt in view of sitter with necessary precautions maintained.  Per day shift RN, mother expressing concerns that pt is being given Zyprexa while in hospital; mother believes this is why patient is sleeping so much.

## 2023-02-17 NOTE — ED Notes (Addendum)
Pt cleaned and changed due to being soiled.

## 2023-02-17 NOTE — ED Provider Notes (Signed)
Nursing staff mention that the patient became agitated, was trying to climb out of bed and was a risk for self-harm and fall. Was also physically fighting against staff aid.  She was placed back into soft bilateral wrist restraints and is tolerating well, calm and laying in bed.   Natalie Brown, [place] 02/17/23 4782

## 2023-02-17 NOTE — ED Notes (Addendum)
Pt agitated and attempting to climb out of bed over rails.  Pt being placed back in non-violent restraints for her safety.

## 2023-02-17 NOTE — Progress Notes (Signed)
Patients mother is still in search for housing. Patients case worker with [facility] is searching for memory care placement options. Ms. Laural Benes at this time hasn't been able to locate a facility to take patient.

## 2023-02-17 NOTE — ED Notes (Signed)
Pt calm, resting in bed, she ate 100% of  apple sauce with medicine.

## 2023-02-17 NOTE — ED Provider Notes (Signed)
Emergency Medicine Observation Re-evaluation Note  Natalie Brown is a 57 y.o. female, seen on rounds today.  Pt initially presented to the ED for complaints of Agitation Currently, the patient is asleep.  Physical Exam  BP 116/78 (BP Location: Left Arm)   Pulse (!) 107   Temp 98.3 F (36.8 C) (Oral)   Resp 16   Ht 5\' 6"  (1.676 m)   Wt 46 kg   SpO2 100%   BMI 16.37 kg/m  Physical Exam General: asleep Cardiac: asleep Lungs: asleep Psych: asleep  ED Course / MDM  EKG:EKG Interpretation Date/Time:  Saturday February 08 2023 14:48:01 EDT Ventricular Rate:  96 PR Interval:  130 QRS Duration:  68 QT Interval:  342 QTC Calculation: 432 R Axis:   79  Text Interpretation: Normal sinus rhythm Normal ECG When compared with ECG of 23-Nov-2022 04:13, PREVIOUS ECG IS PRESENT Confirmed by Kommor, [name] ([id number]) on 02/09/2023 11:46:25 AM  I have reviewed the labs performed to date as well as medications administered while in observation.  No recent changes in the last 24 hours.  Plan  Current plan is for placement. Was put in restraints this AM due to agitation.    Pricilla Loveless, MD 02/17/23 0930

## 2023-02-17 NOTE — ED Notes (Addendum)
Pt's bed alarm heard going on.  This RN entered room and noticed pt trying to get out of bed and also that pt's pad was wet with urine.  This RN and techs on unit cleaned pt.  During this time, pt rocking in bed and fighting against staff.  Pt able to calm down once done being cleaned.  Pt covered with blanket.  This RN to administer previously held medications.  Bed alarm reset for pt safety.

## 2023-02-18 NOTE — ED Provider Notes (Signed)
Emergency Medicine Observation Re-evaluation Note  Natalie Brown is a 57 y.o. female, seen on rounds today.  Pt initially presented to the ED for complaints of Agitation Currently, the patient is resting.  Physical Exam  BP 110/75 (BP Location: Left Arm)   Pulse 62   Temp 97.6 F (36.4 C) (Oral)   Resp 15   Ht 5\' 6"  (1.676 m)   Wt 46 kg   SpO2 100%   BMI 16.37 kg/m  Physical Exam General: nad Cardiac: regular rate Lungs: no resp distress Psych: calm  ED Course / MDM  EKG:EKG Interpretation Date/Time:  Saturday February 08 2023 14:48:01 EDT Ventricular Rate:  96 PR Interval:  130 QRS Duration:  68 QT Interval:  342 QTC Calculation: 432 R Axis:   79  Text Interpretation: Normal sinus rhythm Normal ECG When compared with ECG of 23-Nov-2022 04:13, PREVIOUS ECG IS PRESENT Confirmed by Kommor, [name] ([id number]) on 02/09/2023 11:46:25 AM  I have reviewed the labs performed to date as well as medications administered while in observation.  Recent changes in the last 24 hours include no changes.  Family still looking for housing, memory care placement.  TOC following  Plan  Current plan is for eventual placement.    Sloan Leiter, DO 02/18/23 [phone]

## 2023-02-18 NOTE — [place] FL2 (Signed)
[place] MEDICAID FL2 LEVEL OF CARE FORM     IDENTIFICATION  Patient Name: Natalie Brown Birthdate: Dec 28, 1965 Sex: female Admission Date (Current Location): 01/22/2023  [place] and [place] Number:  Haynes Bast [id number] [facility] Facility and Address:  [facility], [address] [place], [place] [address]      Provider Number: [id number]  Attending Physician Name and Address:  System, Provider Not In  Relative Name and Phone Number:  Kyla Balzarine (Mother)  [phone]    Current Level of Care: Hospital Recommended Level of Care: Memory Care, Assisted Living Facility Prior Approval Number:    Date Approved/Denied:   PASRR Number:    Discharge Plan: Other (Comment) (Assisted living, Memory care)    Current Diagnoses: Patient Active Problem List   Diagnosis Date Noted   Agitation 02/09/2023   Protein-calorie malnutrition, severe 11/16/2022   B12 deficiency 02/19/2022   COVID-19 virus infection 02/19/2022   Drooling 02/19/2022   Essential hypertension 02/19/2022   Altered mental state 12/26/2021   Sinus tachycardia 12/25/2021   Tobacco abuse 12/24/2021   Delirium due to multiple etiologies, acute, hyperactive 12/24/2021   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) 11/11/2021   Bipolar affective disorder, current episode manic with psychotic symptoms (HCC) 09/12/2021   Dental caries 06/29/2015   Cervical cancer screening 02/22/2014   Current smoker 02/22/2014   Urge incontinence of urine 12/30/2013   Need for prophylactic vaccination and inoculation against influenza 12/30/2013   Bipolar 1 disorder (HCC) 12/30/2013    Orientation RESPIRATION BLADDER Height & Weight      (Patient nonverbal. Unable to assess)  Normal Incontinent Weight: 101 lb 6.6 oz (46 kg) Height:  5\' 6"  (167.6 cm)  BEHAVIORAL SYMPTOMS/MOOD NEUROLOGICAL BOWEL NUTRITION STATUS  Wanderer (Dementia)   Incontinent Diet (Regular)  AMBULATORY STATUS COMMUNICATION OF  NEEDS Skin   Limited Assist Verbally (Patient interacts verbally at times, but also does not engage verbally at times) Normal                       Personal Care Assistance Level of Assistance  Bathing, Feeding, Dressing Bathing Assistance: Maximum assistance Feeding assistance: Limited assistance Dressing Assistance: Maximum assistance     Functional Limitations Info  Sight, Hearing, Speech Sight Info: Adequate Hearing Info: Adequate Speech Info: Impaired (Nonverbal)    SPECIAL CARE FACTORS FREQUENCY                       Contractures Contractures Info: Not present    Additional Factors Info  Code Status Code Status Info: Full Allergies Info: Penicillins Psychotropic Info: Ativan         Current Medications (02/18/2023):  This is the current hospital active medication list Current Facility-Administered Medications  Medication Dose Route Frequency Provider Last Rate Last Admin   (feeding supplement) PROSource Plus liquid 30 mL  30 mL Oral BID BM Rondel Baton, MD   30 mL at 02/17/23 1250   feeding supplement (ENSURE ENLIVE / ENSURE PLUS) liquid 237 mL  237 mL Oral BID BM Margarita Grizzle, MD   237 mL at 02/17/23 1251   hydrOXYzine (ATARAX) tablet 50 mg  50 mg Oral TID PRN Durwin Glaze, MD       lactulose (CHRONULAC) 10 GM/15ML solution 20 g  20 g Oral BID PRN Rondel Baton, MD       LORazepam (ATIVAN) tablet 0.5 mg  0.5 mg Oral TID Lenox Ponds, NP  0.5 mg at 02/18/23 0121   multivitamin with minerals tablet 1 tablet  1 tablet Oral Daily Rondel Baton, MD   1 tablet at 02/17/23 5409   OLANZapine (ZYPREXA) tablet 2.5 mg  2.5 mg Oral QHS Cathren Laine, MD   2.5 mg at 02/18/23 0122   polyethylene glycol (MIRALAX / GLYCOLAX) packet 17 g  17 g Oral Daily Rondel Baton, MD   17 g at 02/13/23 0911   Current Outpatient Medications  Medication Sig Dispense Refill   Cyanocobalamin (B-12 COMPLIANCE INJECTION IJ) Inject as directed.     feeding  supplement (ENSURE ENLIVE / ENSURE PLUS) LIQD Take 237 mLs by mouth 2 (two) times daily between meals.     lactulose (CHRONULAC) 10 GM/15ML solution Take 30 mLs (20 g total) by mouth 2 (two) times daily as needed for mild constipation.     LORazepam (ATIVAN) 2 MG/ML concentrated solution Take 0.5 mLs (1 mg total) by mouth 3 (three) times daily. 3 mL 0   Multiple Vitamin (MULTIVITAMIN WITH MINERALS) TABS tablet Take 1 tablet by mouth daily.     Nutritional Supplements (,FEEDING SUPPLEMENT, PROSOURCE PLUS) liquid Take 30 mLs by mouth 2 (two) times daily between meals.     polyethylene glycol (MIRALAX / GLYCOLAX) 17 g packet Take 17 g by mouth daily. (Patient taking differently: Take 17 g by mouth daily as needed for mild constipation.) 14 each 0   Incontinence Supply Disposable (BRIEFS OVERNIGHT MEDIUM) MISC 1 each by Does not apply route every 4 (four) hours as needed (soiling). 100 each 2     Discharge Medications: Please see discharge summary for a list of discharge medications.  Relevant Imaging Results:  Relevant Lab Results:   Additional Information SSN: [id number]  Susa Simmonds, LCSWA

## 2023-02-18 NOTE — ED Notes (Signed)
Patient continues to try to get up and walk; pt is off to the side of the bed; Pt asked Sitter what time it was because she had to go. Pt appears to be restless and slightly agitated; Pt ate whole ice cream cup-Monique,RN

## 2023-02-18 NOTE — Progress Notes (Signed)
Patients case worker Alycia Rossetti with [facility] requested patients FL2. FL2 has been emailed. Patients mother continues to struggle to locate housing for patient.

## 2023-02-19 NOTE — ED Provider Notes (Signed)
Emergency Medicine Observation Re-evaluation Note  Natalie Brown is a 57 y.o. female, seen on rounds today.  Pt initially presented to the ED for complaints of Agitation Currently, the patient is resting comfortably.  Physical Exam  BP 129/66 (BP Location: Right Arm)   Pulse (!) 50   Temp 97.6 F (36.4 C) (Oral)   Resp 18   Ht 5\' 6"  (1.676 m)   Wt 46 kg   SpO2 100%   BMI 16.37 kg/m  Physical Exam General: Resting without acute agitation Cardiac: Was not tachycardic on last vital signs Lungs: Metric rise and fall of chest wall patient is resting comfortably Psych: No agitation at this time  ED Course / MDM  EKG:EKG Interpretation Date/Time:  Saturday February 08 2023 14:48:01 EDT Ventricular Rate:  96 PR Interval:  130 QRS Duration:  68 QT Interval:  342 QTC Calculation: 432 R Axis:   79  Text Interpretation: Normal sinus rhythm Normal ECG When compared with ECG of 23-Nov-2022 04:13, PREVIOUS ECG IS PRESENT Confirmed by Kommor, [name] ([id number]) on 02/09/2023 11:46:25 AM  I have reviewed the labs performed to date as well as medications administered while in observation.  Recent changes in the last 24 hours include none reported by nursing.  Plan  Current plan is for awaiting placement.    Harjit Leider, Canary Brim, MD 02/19/23 [phone]

## 2023-02-19 NOTE — ED Notes (Signed)
Patient has no sitter for the night.  Patient currently in soft wrist restraint (non-violent).  RN will continue to monitor

## 2023-02-19 NOTE — ED Notes (Signed)
Patient woke up setting the bed alarm off; pt found on side of bed attempting to get out; Patient was cleaned and changed and bedding changed; Patient has increased agitation and will not follow commands to stay in bed; pt continues to try to get out of the bed; EDP notified that non-violent restraints will need to be reapplied at this time; sitter for other patient in unit pulled to sit with this patient til she can be contained-Monique,RN

## 2023-02-19 NOTE — ED Notes (Signed)
Patient hygiene performed.  Patient assisted to eat strawberry ice cream.  Warm blanket provided for comfort

## 2023-02-19 NOTE — ED Notes (Signed)
Pt's mother visited with pt for about 2 hours. Pt's mother saying goodbye to pt and leaving for the night.

## 2023-02-20 MED ORDER — STERILE WATER FOR INJECTION IJ SOLN
INTRAMUSCULAR | Status: AC
  Administered 2023-02-20: 10 mL
  Filled 2023-02-20: qty 10

## 2023-02-20 MED ORDER — ZIPRASIDONE MESYLATE 20 MG IM SOLR
20.0000 mg | Freq: Once | INTRAMUSCULAR | Status: AC
  Administered 2023-02-20: 20 mg via INTRAMUSCULAR
  Filled 2023-02-20: qty 20

## 2023-02-20 NOTE — ED Notes (Signed)
Pt cleaned of moderate amount of stool.  Clean brief applied.  Pt continues to Scientist, physiological. Attempting to climb out of bed despite restraints.  Pt given 1 cup of applesauce with medication, feeding supplement and water. Restraints readjusted and ROM performed.

## 2023-02-20 NOTE — ED Notes (Signed)
Patient continues to appear agitated.  Unable to settle.  Rocking back and forth in bed.  Will continue to monitor

## 2023-02-20 NOTE — ED Notes (Signed)
Pt brief changed.  Applesauce provided with medications.  Pt refuses other PO intake at this time

## 2023-02-20 NOTE — ED Notes (Signed)
Patient resting quietly.  Respirations even and unlabored.  ?

## 2023-02-20 NOTE — ED Notes (Signed)
Patient incontinent and wet at this time.  Attempting to get out of bed.  Unable to be redirect.  When attempting to change patient, pt kicking legs and moving rapidly in bed.  Restraints continued at this time for patient's safety

## 2023-02-20 NOTE — ED Notes (Signed)
Patient appears to be more agitated.  Rocking in bed and pulling at L wrist restraint.  Given warm blanket and attempting comfort measures

## 2023-02-20 NOTE — ED Notes (Signed)
Patient has continued to flip herself around in bed.  Stool noted in brief and on bed sheets.  Attempted to change patient and sheets.  Patient continued to flip in bed and attempted to exit bed multiple times.  Unable to redirect patient.  Patient remains in restraints.  Unable to remove due to patient safety.  Pt becoming move agitated and aggressive.  ED MD made aware and PRN medication order.  Patient medicated with help assist from charge RN

## 2023-02-20 NOTE — Progress Notes (Signed)
Patient has no housing or placement options at this time

## 2023-02-20 NOTE — ED Notes (Signed)
Patient incontinent of stool and urine.   Patient changed and new brief placed.  New sheets, gown, and blanket applied.

## 2023-02-20 NOTE — ED Provider Notes (Signed)
Emergency Medicine Observation Re-evaluation Note  Natalie Brown is a 57 y.o. female, seen on rounds today.  Pt initially presented to the ED for complaints of Agitation Currently, the patient is resting.  Physical Exam  BP 104/81 (BP Location: Right Arm)   Pulse 80   Temp 97.6 F (36.4 C) (Axillary)   Resp 14   Ht 5\' 6"  (1.676 m)   Wt 46 kg   SpO2 100%   BMI 16.37 kg/m  Physical Exam General: no distress Cardiac: regular rate Lungs: equal chest rise Psych: calm  ED Course / MDM  EKG:EKG Interpretation Date/Time:  Saturday February 08 2023 14:48:01 EDT Ventricular Rate:  96 PR Interval:  130 QRS Duration:  68 QT Interval:  342 QTC Calculation: 432 R Axis:   79  Text Interpretation: Normal sinus rhythm Normal ECG When compared with ECG of 23-Nov-2022 04:13, PREVIOUS ECG IS PRESENT Confirmed by Kommor, [name] ([id number]) on 02/09/2023 11:46:25 AM  I have reviewed the labs performed to date as well as medications administered while in observation.  Recent changes in the last 24 hours include intermittently requiring soft restraints due to agitation.  Plan  Current plan is for placement.    Lonell Grandchild, MD 02/20/23 [phone]

## 2023-02-20 NOTE — ED Notes (Signed)
Assumed care of patient, NAD noted at this time, pt restless in bed, respirations even and unlabored, skin warm and dry, bed in low position. Comfort measures offered. Will continue to monitor. Pt continues to have restraints to the bilateral wrist.

## 2023-02-20 NOTE — ED Notes (Signed)
Attempted to release bilateral wrist restraints.  Patient began pulling at restraints.  Unable to release at this time.  Will continue to monitor

## 2023-02-20 NOTE — ED Notes (Signed)
Patient continues to rock back and forth in bed.  Attempting to get out of bed.  Restraints remain in place.  Comfort measures addressed

## 2023-02-21 NOTE — Progress Notes (Signed)
Patient has no housing or placement options at this time

## 2023-02-21 NOTE — ED Notes (Signed)
Aticvan dropped when attempting to administer another removed to givetothe pt witnessed by [place] rn

## 2023-02-21 NOTE — ED Notes (Signed)
Pt was given peri-care, changed into a clean brief/gown, bed sheets changed & was attempted to be fed after she took her meds crushed in apple sauce. Pt moving all around & unable to be coerced to take more than one bite or sit up safely to eat. Will attempt to feed her again after she sleeps, pt asleep before this RN left the room. After staff changed her, ankle restraints were needed in addition to the soft wrist restraints that were already on for her safety.

## 2023-02-21 NOTE — ED Notes (Signed)
Safety sitter arriving soon

## 2023-02-21 NOTE — ED Notes (Signed)
Pt's mother at beside.

## 2023-02-21 NOTE — ED Notes (Signed)
Pt noted to be rocking in bed.  Upon checking pt, pt noted to have had bowel movement.  Techs on unit assisting with cleaning pt at this time.

## 2023-02-21 NOTE — ED Notes (Signed)
Pt feeding self from dinner tray at this time.

## 2023-02-21 NOTE — ED Notes (Addendum)
Pt received for care at this time.  Pt awake; respirations even and unlabored.    Bed in lowest position, wheels locked.  Pt remains in bilateral wrist restraints with necessary precautions maintained.

## 2023-02-21 NOTE — ED Provider Notes (Signed)
Emergency Medicine Observation Re-evaluation Note  Natalie Brown is a 57 y.o. female, seen on rounds today.  Pt initially presented to the ED for complaints of Agitation Currently, the patient is resting comfortably.  No acute concerns.  Patient was recently discharged to facility and sent back the same day.  [name] currently awaiting placement.  Has required treatment for agitation.  Coming out of restraints this morning.  Physical Exam  BP 94/61   Pulse 78   Temp 98.5 F (36.9 C) (Oral)   Resp 15   Ht 5\' 6"  (1.676 m)   Wt 46 kg   SpO2 100%   BMI 16.37 kg/m  Physical Exam General: Calm, resting comfortably Lungs: Normal respiratory effort Psych: Calm, cooperative  ED Course / MDM  EKG:EKG Interpretation Date/Time:  Saturday February 08 2023 14:48:01 EDT Ventricular Rate:  96 PR Interval:  130 QRS Duration:  68 QT Interval:  342 QTC Calculation: 432 R Axis:   79  Text Interpretation: Normal sinus rhythm Normal ECG When compared with ECG of 23-Nov-2022 04:13, PREVIOUS ECG IS PRESENT Confirmed by Kommor, [name] ([id number]) on 02/09/2023 11:46:25 AM  I have reviewed the labs performed to date as well as medications administered while in observation.  Recent changes in the last 24 hours include required IM Geodon yesterday.  Plan  Current plan is for placement.    Laurence Spates, MD 02/21/23 [phone]

## 2023-02-21 NOTE — ED Notes (Signed)
The pts restraints were removed from her ankles

## 2023-02-21 DEATH — deceased

## 2023-02-22 NOTE — ED Notes (Signed)
Patient's family member at bedside. 

## 2023-02-22 NOTE — ED Provider Notes (Signed)
Emergency Medicine Observation Re-evaluation Note  Natalie Brown is a 57 y.o. female, seen on rounds today.  Pt initially presented to the ED for complaints of Agitation Currently, the patient is being changed, has required 4-point restraints  Physical Exam  BP 118/82   Pulse 74   Temp 97.9 F (36.6 C) (Axillary)   Resp 16   Ht 5\' 6"  (1.676 m)   Wt 46 kg   SpO2 95%   BMI 16.37 kg/m  Physical Exam Vitals reviewed.  Constitutional:      Appearance: Normal appearance.  Cardiovascular:     Rate and Rhythm: Normal rate.  Pulmonary:     Effort: Pulmonary effort is normal.  Abdominal:     General: There is no distension.  Neurological:     General: No focal deficit present.     Mental Status: [name] alert.  Psychiatric:        Mood and Affect: Mood normal.      ED Course / MDM  EKG:EKG Interpretation Date/Time:  Saturday February 08 2023 14:48:01 EDT Ventricular Rate:  96 PR Interval:  130 QRS Duration:  68 QT Interval:  342 QTC Calculation: 432 R Axis:   79  Text Interpretation: Normal sinus rhythm Normal ECG When compared with ECG of 23-Nov-2022 04:13, PREVIOUS ECG IS PRESENT Confirmed by Kommor, [name] ([id number]) on 02/09/2023 11:46:25 AM  I have reviewed the labs performed to date as well as medications administered while in observation.  Recent changes in the last 24 hours include ordered for point restraints.  Plan  Current plan is for inpatient placement.    Anders Simmonds T, DO 02/22/23 1129

## 2023-02-22 NOTE — ED Notes (Signed)
[employer] and ensure to be given w/ dinner d/t pt sleeping.

## 2023-02-23 NOTE — ED Provider Notes (Signed)
Emergency Medicine Observation Re-evaluation Note  Natalie Brown is a 57 y.o. female, seen on rounds today.  Pt initially presented to the ED for complaints of failed d/c from hospital to SNF after prolonged inpatient stay and prolonged placement. Pt remains boarding in ED. No new c/o this AM.  Physical Exam  BP 128/67 (BP Location: Left Arm)   Pulse 71   Temp 98 F (36.7 C) (Oral)   Resp 18   Ht 1.676 m (5\' 6" )   Wt 46 kg   SpO2 95%   BMI 16.37 kg/m  Physical Exam General: alert, nad.  Cardiac: regular rate.  Lungs: breathing comfortably.   ED Course / MDM    I have reviewed the labs performed to date as well as medications administered while in observation.  Recent changes in the last 24 hours include TOC eval, reassessment.   Plan  TOC team last several day indicate 'no housing or placement option' - as indefinite boarding in ED setting appears poor option for patient, will escalate to [facility] leader team as it appears more intentional, more aggressive approach to placement of patient may be needed.    Cathren Laine, MD 02/23/23 [phone]

## 2023-02-23 NOTE — ED Notes (Signed)
Pt moving all over bed, shaking and trying to get up. Pt reassured and repositioned

## 2023-02-23 NOTE — ED Notes (Signed)
Pt extremely agitated, pt offered water, PRN medication given

## 2023-02-23 NOTE — ED Notes (Signed)
Gave patient her night time meds. Changed diaper. Cleaned bed. Helped give her food. She drank some milk. Ate her bowl of fruit, a banana, apple sauce.

## 2023-02-24 NOTE — Progress Notes (Signed)
Patient has no housing or placement options at this time

## 2023-02-24 NOTE — ED Provider Notes (Signed)
Emergency Medicine Observation Re-evaluation Note  Natalie Brown is a 57 y.o. female, seen on rounds today.  Pt initially presented to the ED for complaints of Agitation Currently, the patient is asleep.  Sternal rub applied and patient withdrew to that, but she is not really responding to verbal cue.  Patient had restraints on.  Nursing staff indicates that patient intermittently very agitated, and hostile.  Nonviolent restraints were placed because she continually tries to get up.  I have requested to be discontinued the nonviolent restraints for now. Nursing request that we order one-on-one sitter which I have.  Physical Exam  BP 100/62 (BP Location: Left Arm)   Pulse 84   Temp 97.9 F (36.6 C) (Oral)   Resp 18   Ht 5\' 6"  (1.676 m)   Wt 46 kg   SpO2 100%   BMI 16.37 kg/m  Physical Exam General: No acute distress Cardiac: Regular rate Lungs: No respiratory distress Psych: Currently calm,  ED Course / MDM  EKG:EKG Interpretation Date/Time:  Saturday February 08 2023 14:48:01 EDT Ventricular Rate:  96 PR Interval:  130 QRS Duration:  68 QT Interval:  342 QTC Calculation: 432 R Axis:   79  Text Interpretation: Normal sinus rhythm Normal ECG When compared with ECG of 23-Nov-2022 04:13, PREVIOUS ECG IS PRESENT Confirmed by Kommor, [name] ([id number]) on 02/09/2023 11:46:25 AM  I have reviewed the labs performed to date as well as medications administered while in observation.  Recent changes in the last 24 hours include -continues to have intermittent episodes of agitation.  Plan  Current plan is for hold for placement.    Derwood Kaplan, MD 02/24/23 1105

## 2023-02-25 MED ORDER — LORAZEPAM 2 MG/ML IJ SOLN
1.0000 mg | Freq: Once | INTRAMUSCULAR | Status: AC
  Administered 2023-02-25: 1 mg via INTRAMUSCULAR
  Filled 2023-02-25: qty 1

## 2023-02-25 NOTE — Progress Notes (Signed)
Patient has no housing or placement options at this time

## 2023-02-25 NOTE — NC FL2 (Signed)
[place] MEDICAID FL2 LEVEL OF CARE FORM     IDENTIFICATION  Patient Name: Natalie Brown Birthdate: 05-05-1965 Sex: female Admission Date (Current Location): 01/22/2023  [place] and [place] Number:  Haynes Bast [id number] [facility] Facility and Address:  [facility], [address] [place], [place] [address]      Provider Number: [id number]  Attending Physician Name and Address:  System, Provider Not In  Relative Name and Phone Number:  Kyla Balzarine (Mother) [phone]    Current Level of Care: Hospital Recommended Level of Care: Skilled Nursing Facility Prior Approval Number:    Date Approved/Denied:   PASRR Number:    Discharge Plan: SNF    Current Diagnoses: Patient Active Problem List   Diagnosis Date Noted   Agitation 02/09/2023   Protein-calorie malnutrition, severe 11/16/2022   B12 deficiency 02/19/2022   COVID-19 virus infection 02/19/2022   Drooling 02/19/2022   Essential hypertension 02/19/2022   Altered mental state 12/26/2021   Sinus tachycardia 12/25/2021   Tobacco abuse 12/24/2021   Delirium due to multiple etiologies, acute, hyperactive 12/24/2021   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) 11/11/2021   Bipolar affective disorder, current episode manic with psychotic symptoms (HCC) 09/12/2021   Dental caries 06/29/2015   Cervical cancer screening 02/22/2014   Current smoker 02/22/2014   Urge incontinence of urine 12/30/2013   Need for prophylactic vaccination and inoculation against influenza 12/30/2013   Bipolar 1 disorder (HCC) 12/30/2013    Orientation RESPIRATION BLADDER Height & Weight     Self  Normal Incontinent Weight: 101 lb 6.6 oz (46 kg) Height:  5\' 6"  (167.6 cm)  BEHAVIORAL SYMPTOMS/MOOD NEUROLOGICAL BOWEL NUTRITION STATUS  Wanderer (Dementia)   Incontinent Diet (Regular)  AMBULATORY STATUS COMMUNICATION OF NEEDS Skin   Limited Assist Verbally (Patient interacts verbally at times, but also does  not engage verbally at times) Normal                       Personal Care Assistance Level of Assistance  Bathing, Feeding, Dressing Bathing Assistance: Maximum assistance Feeding assistance: Limited assistance Dressing Assistance: Maximum assistance     Functional Limitations Info  Sight, Hearing, Speech Sight Info: Adequate Hearing Info: Adequate Speech Info: Impaired (Nonverbal)    SPECIAL CARE FACTORS FREQUENCY                       Contractures Contractures Info: Not present    Additional Factors Info  Code Status, Allergies Code Status Info: Full Allergies Info: Penicillins Psychotropic Info: Ativan         Current Medications (02/25/2023):  This is the current hospital active medication list Current Facility-Administered Medications  Medication Dose Route Frequency Provider Last Rate Last Admin   (feeding supplement) PROSource Plus liquid 30 mL  30 mL Oral BID BM Rondel Baton, MD   30 mL at 02/24/23 1109   feeding supplement (ENSURE ENLIVE / ENSURE PLUS) liquid 237 mL  237 mL Oral BID BM Margarita Grizzle, MD   237 mL at 02/24/23 1111   hydrOXYzine (ATARAX) tablet 50 mg  50 mg Oral TID PRN Durwin Glaze, MD   50 mg at 02/25/23 0838   lactulose (CHRONULAC) 10 GM/15ML solution 20 g  20 g Oral BID PRN Rondel Baton, MD       LORazepam (ATIVAN) tablet 0.5 mg  0.5 mg Oral TID Lenox Ponds, NP   0.5 mg at 02/25/23 [id number]  multivitamin with minerals tablet 1 tablet  1 tablet Oral Daily Rondel Baton, MD   1 tablet at 02/25/23 0839   OLANZapine (ZYPREXA) tablet 2.5 mg  2.5 mg Oral QHS Cathren Laine, MD   2.5 mg at 02/24/23 2107   polyethylene glycol (MIRALAX / GLYCOLAX) packet 17 g  17 g Oral Daily Rondel Baton, MD   17 g at 02/21/23 1052   Current Outpatient Medications  Medication Sig Dispense Refill   Cyanocobalamin (B-12 COMPLIANCE INJECTION IJ) Inject as directed.     feeding supplement (ENSURE ENLIVE / ENSURE PLUS) LIQD Take 237 mLs by  mouth 2 (two) times daily between meals.     lactulose (CHRONULAC) 10 GM/15ML solution Take 30 mLs (20 g total) by mouth 2 (two) times daily as needed for mild constipation.     LORazepam (ATIVAN) 2 MG/ML concentrated solution Take 0.5 mLs (1 mg total) by mouth 3 (three) times daily. 3 mL 0   Multiple Vitamin (MULTIVITAMIN WITH MINERALS) TABS tablet Take 1 tablet by mouth daily.     Nutritional Supplements (,FEEDING SUPPLEMENT, PROSOURCE PLUS) liquid Take 30 mLs by mouth 2 (two) times daily between meals.     polyethylene glycol (MIRALAX / GLYCOLAX) 17 g packet Take 17 g by mouth daily. (Patient taking differently: Take 17 g by mouth daily as needed for mild constipation.) 14 each 0   Incontinence Supply Disposable (BRIEFS OVERNIGHT MEDIUM) MISC 1 each by Does not apply route every 4 (four) hours as needed (soiling). 100 each 2     Discharge Medications: Please see discharge summary for a list of discharge medications.  Relevant Imaging Results:  Relevant Lab Results:   Additional Information SSN: [id number]  Susa Simmonds, LCSWA

## 2023-02-25 NOTE — ED Notes (Signed)
PT is standing up in bed and trying to get out, gave her some medication for anxiety.

## 2023-02-25 NOTE — ED Notes (Signed)
Not following commands, but able to feed self breakfast without assistance. No difficulty swallowing or coughing noted.

## 2023-02-25 NOTE — ED Provider Notes (Signed)
Emergency Medicine Observation Re-evaluation Note  Natalie Brown is a 57 y.o. female, seen on rounds today.  Pt initially presented to the ED for complaints of failed d/c from hospital with arranged facilities staff refusing to accept patient - pt now remains in ED awaiting housing placement.   Physical Exam  BP (!) 102/59 (BP Location: Right Arm)   Pulse 72   Temp 97.6 F (36.4 C) (Oral)   Resp 16   Ht 1.676 m (5\' 6" )   Wt 46 kg   SpO2 100%   BMI 16.37 kg/m  Physical Exam General: resting.  Cardiac: regular rate Lungs: breathing comfortably Psych: resting.   ED Course / MDM    I have reviewed the labs performed to date as well as medications administered while in observation.  Recent changes in the last 24 hours include ED obs, reassessment.   Plan  Current plan is for TOC placement.     Cathren Laine, MD 02/25/23 [phone]

## 2023-02-25 NOTE — ED Notes (Signed)
Patient incontinent of stool. Cleansed and dried. Began climbing out of bed. Unable to redirect. Took multiple staff to keep patient in bed from falling. Dr. Jeraldine Loots informed for evaluation and medication need.

## 2023-02-25 NOTE — ED Notes (Signed)
Up in room walking. Gait unsteady. Assisted back to bed. Agitated and uncooperative. Noted feces on legs, hands and feet. Patient wiping stool all over bed and face. Patient assisted to wheel chair to shower with 3 person assistance patient given complete shower and full bed linen change.

## 2023-02-26 NOTE — ED Notes (Signed)
Patient resting in bed, no s/s of distress, will continue to monitor.  

## 2023-02-26 NOTE — ED Provider Notes (Signed)
Emergency Medicine Observation Re-evaluation Note  Natalie Brown is a 57 y.o. female, seen on rounds today.  Pt initially presented to the ED for complaints of Agitation Currently, the patient is lying in bed. Does not talk to me, though is awake.  Physical Exam  BP (!) 144/53 (BP Location: Left Arm)   Pulse (!) 109   Temp 98.6 F (37 C) (Oral)   Resp 18   Ht 5\' 6"  (1.676 m)   Wt 46 kg   SpO2 99%   BMI 16.37 kg/m  Physical Exam General: no acute distress Lungs: normal effort Psych: not speaking to me  ED Course / MDM  EKG:EKG Interpretation Date/Time:  Saturday February 08 2023 14:48:01 EDT Ventricular Rate:  96 PR Interval:  130 QRS Duration:  68 QT Interval:  342 QTC Calculation: 432 R Axis:   79  Text Interpretation: Normal sinus rhythm Normal ECG When compared with ECG of 23-Nov-2022 04:13, PREVIOUS ECG IS PRESENT Confirmed by Kommor, [name] ([id number]) on 02/09/2023 11:46:25 AM  I have reviewed the labs performed to date as well as medications administered while in observation.  Recent changes in the last 24 hours include given ativan yesterday.  Plan  Current plan is for placement.    Pricilla Loveless, MD 02/26/23 [phone]

## 2023-02-27 NOTE — ED Notes (Signed)
Patient ate 2 ice creams to take ordered medication.Patient attempted to fed self and was successful.  Sat with patient to aid in getting patient to relax and remain in bed. Patient has Recruitment consultant at bedside. Patient clean and dry. Will continue to monitor patient.

## 2023-02-27 NOTE — Progress Notes (Signed)
Patient continues to have no placement options.

## 2023-02-27 NOTE — ED Notes (Signed)
Pt ate 1 pancake, 2 bites of oatmeal, half cup orange juice, few sips of milk.

## 2023-02-27 NOTE — ED Provider Notes (Signed)
Emergency Medicine Observation Re-evaluation Note  Natalie Brown is a 57 y.o. female, seen on rounds today.  Pt initially presented to the ED for complaints of Agitation Currently, the patient is in her room, calm and cooperative  Physical Exam  BP 132/68 (BP Location: Right Arm)   Pulse 93   Temp 98.5 F (36.9 C) (Oral)   Resp 18   Ht 5\' 6"  (1.676 m)   Wt 46 kg   SpO2 100%   BMI 16.37 kg/m  Physical Exam Vitals reviewed.  Constitutional:      Appearance: Normal appearance.  Cardiovascular:     Rate and Rhythm: Normal rate.  Pulmonary:     Effort: Pulmonary effort is normal.  Abdominal:     General: There is no distension.  Neurological:     General: No focal deficit present.     Mental Status: [name] alert.  Psychiatric:        Mood and Affect: Mood normal.      ED Course / MDM  EKG:EKG Interpretation Date/Time:  Saturday February 08 2023 14:48:01 EDT Ventricular Rate:  96 PR Interval:  130 QRS Duration:  68 QT Interval:  342 QTC Calculation: 432 R Axis:   79  Text Interpretation: Normal sinus rhythm Normal ECG When compared with ECG of 23-Nov-2022 04:13, PREVIOUS ECG IS PRESENT Confirmed by Kommor, [name] ([id number]) on 02/09/2023 11:46:25 AM  I have reviewed the labs performed to date as well as medications administered while in observation.  Recent changes in the last 24 hours include, no changes.  Plan  Current plan is for per the patient's last social work note, there is currently no placement options for the patient.  Unsure how this situation is resolved.    Anders Simmonds T, DO 02/27/23 [phone]

## 2023-02-28 NOTE — ED Notes (Signed)
Patient cleaned of incontinence. Linen changed, patient clean and dry. Will continue to monitor.

## 2023-02-28 NOTE — ED Notes (Signed)
Patient restless attempting to get out of bed. Ambulated patient with 2 assist around nurses station. Patient cleaned of incontinence. [name]

## 2023-02-28 NOTE — ED Provider Notes (Signed)
Emergency Medicine Observation Re-evaluation Note  Natalie Brown is a 57 y.o. female, seen on rounds today.  Pt initially presented to the ED for complaints of Agitation Currently, the patient is not having any complaints.  Physical Exam  BP (!) 159/108 (BP Location: Right Arm)   Pulse (!) 107   Temp 98.3 F (36.8 C) (Oral)   Resp (!) 22   Ht 5\' 6"  (1.676 m)   Wt 46 kg   SpO2 100%   BMI 16.37 kg/m  Physical Exam General: Resting on stretcher, does have tremor that has been present previously Lungs: Normal work of breathing Psych: Somewhat anxious appearing  ED Course / MDM  EKG:EKG Interpretation Date/Time:  Saturday February 08 2023 14:48:01 EDT Ventricular Rate:  96 PR Interval:  130 QRS Duration:  68 QT Interval:  342 QTC Calculation: 432 R Axis:   79  Text Interpretation: Normal sinus rhythm Normal ECG When compared with ECG of 23-Nov-2022 04:13, PREVIOUS ECG IS PRESENT Confirmed by Kommor, [name] ([id number]) on 02/09/2023 11:46:25 AM  I have reviewed the labs performed to date as well as medications administered while in observation.  Recent changes in the last 24 hours include no new updates.  Plan  Current plan is for placement.    Rondel Baton, MD 02/28/23 [phone]

## 2023-02-28 NOTE — ED Notes (Signed)
Pt trying to jump out of bed, restless, confused, removed her gown, not following safe limits. Pt is high fall risk and cognitively impaired. Notified EDP

## 2023-02-28 NOTE — Progress Notes (Signed)
No placement options at this time.

## 2023-03-01 NOTE — ED Notes (Signed)
Patient repositioned

## 2023-03-01 NOTE — ED Notes (Signed)
Pt received for care at this time.  Pt currently sleeping in bed; respirations even and unlabored with no distress noted.  Bed in lowest position, wheels locked.  Pt in view of sitter for safety with necessary precautions maintained.

## 2023-03-01 NOTE — ED Notes (Addendum)
Patient resting in bed with eyes closed, no s/s of distress, will continue to monitor.  

## 2023-03-01 NOTE — ED Provider Notes (Signed)
Emergency Medicine Observation Re-evaluation Note  Natalie Brown is a 57 y.o. female, seen on rounds today.  Pt initially presented to the ED for complaints of Agitation Currently, the patient is in her room, does not have any complaints at this time.  Is not requiring physical restraints.  Physical Exam  BP 133/88 (BP Location: Right Arm)   Pulse 99   Temp 98 F (36.7 C) (Oral)   Resp 18   Ht 5\' 6"  (1.676 m)   Wt 46 kg   SpO2 100%   BMI 16.37 kg/m  Physical Exam Cardiovascular:     Rate and Rhythm: Normal rate.  Pulmonary:     Effort: Pulmonary effort is normal.  Neurological:     Mental Status: [name] alert. Mental status is at baseline.     ED Course / MDM  EKG:EKG Interpretation Date/Time:  Saturday February 08 2023 14:48:01 EDT Ventricular Rate:  96 PR Interval:  130 QRS Duration:  68 QT Interval:  342 QTC Calculation: 432 R Axis:   79  Text Interpretation: Normal sinus rhythm Normal ECG When compared with ECG of 23-Nov-2022 04:13, PREVIOUS ECG IS PRESENT Confirmed by Kommor, [name] ([id number]) on 02/09/2023 11:46:25 AM  I have reviewed the labs performed to date as well as medications administered while in observation.  Recent changes in the last 24 hours include there have been no changes.  Plan  Current plan is for placement, this appears to be an ongoing issue.  No options at this time.    Anders Simmonds T, DO 03/01/23 1137

## 2023-03-01 NOTE — ED Notes (Signed)
Pt eating snack at this time.  Not feeding self; staff assisting with feeding.

## 2023-03-01 NOTE — ED Notes (Signed)
Staff and patients mother have attempted to feed her for over an hour, patient has been sleeping.

## 2023-03-01 NOTE — ED Notes (Addendum)
Pt restless in bed; changing position regularly and standing on bed.  This RN to administer scheduled medications to see if these help.  Pt noted to be soiled and changed by staff on unit.  Pt at risk for injuring self and not able to be redirected at this time.  Sitter remains with patient for pt safety.

## 2023-03-02 MED ORDER — OLANZAPINE 10 MG IM SOLR
2.5000 mg | Freq: Once | INTRAMUSCULAR | Status: AC
  Administered 2023-03-02: 2.5 mg via INTRAMUSCULAR
  Filled 2023-03-02: qty 10

## 2023-03-02 NOTE — Progress Notes (Signed)
There are no discharge planning updates available at this time.  Edwin Dada, MSW, LCSW Transitions of Care  Clinical Social Worker II [phone]

## 2023-03-02 NOTE — ED Notes (Signed)
Pt found to have bowel movement again around 0635.  This RN and 2 techs attempted to clean pt and change sheets for about 15 minutes.  Pt remained agitated and fighting against staff despite wrist restraints and continued trying to slide to edge of bed and get up.  Staff able to clean pt and perform quick bed bath but pt continues to pull against restraints and try to get up.  This RN notified Rancour MD of need to place bilateral ankle restraints in addition to continuing wrist restraints for pt's safety.  Rancour MD also notified of pt's need for additional PRN at this time; see upcoming orders.

## 2023-03-02 NOTE — ED Notes (Signed)
Mother present to pt's room. Mother received current updated status. Mother aware of prn medication administered for increased agitation noted via this nurse this morning.

## 2023-03-02 NOTE — ED Provider Notes (Signed)
Emergency Medicine Observation Re-evaluation Note  Natalie Brown is a 57 y.o. female, seen on rounds today.  Pt initially presented to the ED for complaints of Agitation Currently, the patient is still in the room, has intermittently been agitated, is not currently in restraints.  Physical Exam  BP (!) 126/97 (BP Location: Right Arm)   Pulse 62   Temp 98.4 F (36.9 C) (Oral)   Resp 18   Ht 5\' 6"  (1.676 m)   Wt 46 kg   SpO2 100%   BMI 16.37 kg/m  Physical Exam Vitals reviewed.  Constitutional:      Appearance: Normal appearance.  Cardiovascular:     Rate and Rhythm: Normal rate.  Pulmonary:     Effort: Pulmonary effort is normal.  Abdominal:     General: There is no distension.  Neurological:     General: No focal deficit present.     Mental Status: [name] alert.  Psychiatric:        Mood and Affect: Mood normal.      ED Course / MDM  EKG:EKG Interpretation Date/Time:  Saturday February 08 2023 14:48:01 EDT Ventricular Rate:  96 PR Interval:  130 QRS Duration:  68 QT Interval:  342 QTC Calculation: 432 R Axis:   79  Text Interpretation: Normal sinus rhythm Normal ECG When compared with ECG of 23-Nov-2022 04:13, PREVIOUS ECG IS PRESENT Confirmed by Kommor, [name] ([id number]) on 02/09/2023 11:46:25 AM  I have reviewed the labs performed to date as well as medications administered while in observation.  Recent changes in the last 24 hours include some intermittent agitation which has been managed.  Plan  There remains an ongoing placement problem.    Anders Simmonds T, DO 03/02/23 1139

## 2023-03-02 NOTE — ED Notes (Signed)
Patient is extremely agitated attempting to come out of bed; It took Charity fundraiser and two NTs to change patients; pt  had medium BM; Sheet changed and restraints reapplied after cleaning; Bed alarm back on; Pt still remains without safety sitter at this time-Monique,RN

## 2023-03-02 NOTE — ED Notes (Addendum)
Pt cleaned and changed at this time.  Linens also changed.  PO intake encouraged; pt tolerated some PO intake.

## 2023-03-02 NOTE — ED Notes (Signed)
Pt continues to attempt getting out of bed. Bed noted locked and in lowest position.

## 2023-03-02 NOTE — ED Notes (Signed)
Pt refused to eat breakfast at this time. Pt agitated, continues to try to get out of bed. Nonviolent restraints intact at this time.

## 2023-03-02 NOTE — ED Notes (Signed)
Pt cleaned and changed by staff.  Pt placed back in non-violent restraints due to agitation and risk of hurting self as pt was trying to climb out of bed and attempting to stand on bed.  Provider notified to renew order.

## 2023-03-03 NOTE — ED Notes (Signed)
While giving pt ensure, pro source and meds, pt spoke to this RN saying, "Well, you gotta make it".

## 2023-03-03 NOTE — ED Notes (Signed)
Attempted to wake pt to eat lunch. Pt sleeping soundly w/ rise and fall of chest. Will attempt to feed pt lunch and give meds when pt wakes up

## 2023-03-03 NOTE — ED Notes (Signed)
Pt's mother at bedside. Updated her on how pt has been today.

## 2023-03-03 NOTE — ED Provider Notes (Signed)
Emergency Medicine Observation Re-evaluation Note  Natalie Brown is a 57 y.o. female, seen on rounds today.  Pt initially presented to the ED for complaints of Agitation Currently, the patient is awaiting placement. Patient with early onset dementia and behavioral disturbance. No placement options at this time.   Physical Exam  BP (!) 126/97 (BP Location: Right Arm)   Pulse 62   Temp 98.4 F (36.9 C) (Oral)   Resp 18   Ht 5\' 6"  (1.676 m)   Wt 46 kg   SpO2 100%   BMI 16.37 kg/m  Physical Exam General: Calm Cardiac: well perfused Lungs: even respirations  ED Course / MDM  EKG:EKG Interpretation Date/Time:  Saturday February 08 2023 14:48:01 EDT Ventricular Rate:  96 PR Interval:  130 QRS Duration:  68 QT Interval:  342 QTC Calculation: 432 R Axis:   79  Text Interpretation: Normal sinus rhythm Normal ECG When compared with ECG of 23-Nov-2022 04:13, PREVIOUS ECG IS PRESENT Confirmed by Kommor, [name] ([id number]) on 02/09/2023 11:46:25 AM  I have reviewed the labs performed to date as well as medications administered while in observation.  Recent changes in the last 24 hours include agitation yesterday evening.  Plan  Current plan is for placement.    Maia Plan, MD 03/03/23 [phone]

## 2023-03-04 NOTE — Progress Notes (Signed)
CSW contacted patients case worker Alycia Rossetti [phone] with [facility] recovery services. Ms. Laural Benes stated she still hasn't been able to find any personal care services, memory care, or skilled nursing placement for patient. Ms. Laural Benes stated she is not sure what to do at this point. CSW updated patients mother.

## 2023-03-04 NOTE — Progress Notes (Signed)
No placement options at this time.

## 2023-03-04 NOTE — ED Notes (Signed)
Patient up moving in the bed but able to be redirected; pt changed and bed alarm reactivated-Monique,RN

## 2023-03-04 NOTE — ED Notes (Signed)
Report received from Selinda Eon RN. Assumed care of pt at this time.

## 2023-03-04 NOTE — ED Provider Notes (Signed)
Emergency Medicine Observation Re-evaluation Note  Natalie Brown is a 57 y.o. female, seen on rounds today.  Pt initially presented to the ED for complaints of Agitation Currently, the patient is awaiting placement. Patient with early onset dementia and behavioral disturbance. Waiting on placement  Physical Exam  BP (!) 190/74 (BP Location: Right Arm)   Pulse 85   Temp 98.5 F (36.9 C)   Resp 17   Ht 5\' 6"  (1.676 m)   Wt 46 kg   SpO2 100%   BMI 16.37 kg/m  Physical Exam General: NAD  ED Course / MDM  EKG:EKG Interpretation Date/Time:  Saturday February 08 2023 14:48:01 EDT Ventricular Rate:  96 PR Interval:  130 QRS Duration:  68 QT Interval:  342 QTC Calculation: 432 R Axis:   79  Text Interpretation: Normal sinus rhythm Normal ECG When compared with ECG of 23-Nov-2022 04:13, PREVIOUS ECG IS PRESENT Confirmed by Kommor, [name] ([id number]) on 02/09/2023 11:46:25 AM  I have reviewed the labs performed to date as well as medications administered while in observation.  Recent changes in the last 24 hours include none.  Plan  Current plan is for placement.      Ernie Avena, MD 03/04/23 [phone]

## 2023-03-05 NOTE — Progress Notes (Signed)
No options for placement at this time.

## 2023-03-05 NOTE — ED Provider Notes (Signed)
Emergency Medicine Observation Re-evaluation Note  Natalie Brown is a 57 y.o. female, seen on rounds today.  Pt initially presented to the ED for complaints of Agitation Currently, the patient is resting comfortably.  Physical Exam  BP 113/68   Pulse 100   Temp 97.6 F (36.4 C)   Resp 18   Ht 5\' 6"  (1.676 m)   Wt 46 kg   SpO2 100%   BMI 16.37 kg/m  Physical Exam General: No acute distress Cardiac: Well-perfused Lungs: Clear to auscultation bilaterally Psych: Resting comfortably  ED Course / MDM  EKG:EKG Interpretation Date/Time:  Saturday February 08 2023 14:48:01 EDT Ventricular Rate:  96 PR Interval:  130 QRS Duration:  68 QT Interval:  342 QTC Calculation: 432 R Axis:   79  Text Interpretation: Normal sinus rhythm Normal ECG When compared with ECG of 23-Nov-2022 04:13, PREVIOUS ECG IS PRESENT Confirmed by Kommor, [name] ([id number]) on 02/09/2023 11:46:25 AM  I have reviewed the labs performed to date as well as medications administered while in observation.  Recent changes in the last 24 hours include none.  Plan  Current plan is for placement.    Durwin Glaze, MD 03/05/23 1230

## 2023-03-05 NOTE — ED Notes (Signed)
Pt is awake, has been repositioned by sitters and is being fed by sitters.  Pt took meds crushed in applesauce without hesitation.

## 2023-03-05 NOTE — ED Notes (Signed)
Patient jumped out of bed attempting to ambulate to the bathroom; Sitter had to hold patient up and walk her to the bathroom

## 2023-03-05 NOTE — ED Notes (Signed)
Patient given medications with Strawberry ice cream

## 2023-03-06 MED ORDER — HALOPERIDOL 1 MG PO TABS
2.0000 mg | ORAL_TABLET | Freq: Four times a day (QID) | ORAL | Status: DC | PRN
  Administered 2023-03-06 – 2023-03-09 (×3): 2 mg via ORAL
  Filled 2023-03-06 (×3): qty 2

## 2023-03-06 MED ORDER — OLANZAPINE 5 MG PO TBDP
5.0000 mg | ORAL_TABLET | Freq: Once | ORAL | Status: AC
  Administered 2023-03-06: 5 mg via ORAL

## 2023-03-06 MED ORDER — LORAZEPAM 2 MG/ML IJ SOLN
2.0000 mg | Freq: Once | INTRAMUSCULAR | Status: AC
  Administered 2023-03-06: 2 mg via INTRAMUSCULAR
  Filled 2023-03-06: qty 1

## 2023-03-06 MED ORDER — OLANZAPINE 5 MG PO TBDP
5.0000 mg | ORAL_TABLET | Freq: Every day | ORAL | Status: DC
  Filled 2023-03-06: qty 1

## 2023-03-06 NOTE — ED Provider Notes (Signed)
Emergency Medicine Observation Re-evaluation Note  Natalie Brown is a 57 y.o. female, seen on rounds today.  Pt initially presented to the ED for complaints of Agitation Currently, the patient is agitated trying to get out of the bed.    Physical Exam  BP 125/62 (BP Location: Left Arm)   Pulse 99   Temp 97.6 F (36.4 C) (Axillary)   Resp 14   Ht 5\' 6"  (1.676 m)   Wt 46 kg   SpO2 99%   BMI 16.37 kg/m  Physical Exam General: agitated Cardiac: regular rate Lungs: clear Psych: agitated but will follow some basic commands  ED Course / MDM  EKG:EKG Interpretation Date/Time:  Saturday February 08 2023 14:48:01 EDT Ventricular Rate:  96 PR Interval:  130 QRS Duration:  68 QT Interval:  342 QTC Calculation: 432 R Axis:   79  Text Interpretation: Normal sinus rhythm Normal ECG When compared with ECG of 23-Nov-2022 04:13, PREVIOUS ECG IS PRESENT Confirmed by Kommor, [name] ([id number]) on 02/09/2023 11:46:25 AM  I have reviewed the labs performed to date as well as medications administered while in observation.  Recent changes in the last 24 hours include attempting to keep pt out of restraints and on home meds.  .  Plan  Current plan is for placement but today pt standing on bed.  Unsteady but repeatedly trying to get out of bed.  Will attempt to walk the pt with nurses in the hallway for some exercise.  Given a dose of odt zyprexa for agitation and to try to keep out of restraints however pt is not steady and concern for injury  if she is not monitored closely.    Gwyneth Sprout, MD 03/06/23 [phone]

## 2023-03-06 NOTE — ED Notes (Signed)
Pt repositioned and given a warm blanket.

## 2023-03-06 NOTE — ED Notes (Signed)
Pt's mom back at bedside

## 2023-03-06 NOTE — ED Notes (Signed)
Came back from break and pt very agitated. Brief changed, pt was still agitated. Notified EDP of need for prn meds.

## 2023-03-06 NOTE — ED Notes (Signed)
Pt attempting to get out of bed. Unable to verbally redirect. Pt not following commands. Medication administered as ordered by EDP.

## 2023-03-06 NOTE — ED Notes (Signed)
Pt keeps trying to get out of bed, stood on the bed and tried to jump off the bed. Staff intervened to keep pt from falling. Fall precautions still all in place. Notified EDP. PRN to be given.

## 2023-03-06 NOTE — ED Notes (Signed)
Fed pt, pt ate bowl of oatmeal and half of a sausage pattie. As well as orange juice.

## 2023-03-06 NOTE — ED Notes (Signed)
TOC

## 2023-03-06 NOTE — Progress Notes (Signed)
Family meeting held today with hospital administration and [facility] leadership. Patients mother was present for the meeting. TOC will continue to find placement for patient.

## 2023-03-06 NOTE — Progress Notes (Signed)
CSW sent a new referral to [facility] for review. CSW spoke with Whitney Post with intake who will look at patient again. Patient was previously denied at [facility].

## 2023-03-06 NOTE — ED Notes (Signed)
Hydroxyzine given for agitation

## 2023-03-06 NOTE — ED Notes (Addendum)
Pt agitated and trying to get out of the bed; unable to follow directions; unable to follow safe limits

## 2023-03-06 NOTE — ED Notes (Signed)
Pt was changed by myself and the nurse assigned to her. Pt is repositioned and dry. Pt is now asleep.

## 2023-03-06 NOTE — ED Notes (Signed)
During shift report, pt was found out of bed and walking in the unit. Pt does not have a sitter. Per report, pt cannot be restrained anymore for safety of pt. All fall precautions in place. Pt is not redirectable. Pt had to be assisted back into her room and onto her bed. Notified staffing, AC, and CN.

## 2023-03-06 NOTE — ED Notes (Signed)
Pt assisted into a recliner. Mom at bedside and pt is much calmer.

## 2023-03-06 NOTE — ED Notes (Addendum)
Pt mother left from visit. After mother left, pt out of chair , unsteady gait, grabbing at staff, Pt redirected back to chair by this RN and social work who was at bedside.

## 2023-03-07 NOTE — ED Notes (Signed)
PT again noted to be wet, brief changed, pt clean and dry at this time.

## 2023-03-07 NOTE — ED Provider Notes (Signed)
Emergency Medicine Observation Re-evaluation Note  Natalie Brown is a 57 y.o. female, seen on rounds today.  Pt initially presented to the ED for complaints of Agitation Currently, the patient is sleeping  Physical Exam  BP 122/68   Pulse 89   Temp 97.8 F (36.6 C) (Axillary)   Resp 16   Ht 5\' 6"  (1.676 m)   Wt 46 kg   SpO2 100%   BMI 16.37 kg/m  Physical Exam General: sleeping Cardiac: well-perfused Lungs: no resp distress Psych: calm, sleeping  ED Course / MDM  EKG:EKG Interpretation Date/Time:  Saturday February 08 2023 14:48:01 EDT Ventricular Rate:  96 PR Interval:  130 QRS Duration:  68 QT Interval:  342 QTC Calculation: 432 R Axis:   79  Text Interpretation: Normal sinus rhythm Normal ECG When compared with ECG of 23-Nov-2022 04:13, PREVIOUS ECG IS PRESENT Confirmed by Kommor, [name] ([id number]) on 02/09/2023 11:46:25 AM  I have reviewed the labs performed to date as well as medications administered while in observation.  Recent changes in the last 24 hours include agitation yesterday requiring restraints.  Plan  Current plan is for placement    [name], MD 03/07/23 [phone]

## 2023-03-07 NOTE — Progress Notes (Signed)
No placement at this time.

## 2023-03-07 NOTE — ED Notes (Signed)
Pt fed, ate 1/2 meatloaf, 1 cup ice cream and 1 cup apple sauce.  Pt drank cup of tea and carton of milk.  Pt brief changed, pt clean and dry at this time.

## 2023-03-08 NOTE — ED Notes (Signed)
Patient has been standing in the bed and attempting to get out of bed for over 2 hrs; Staff has tried music and verbal redirection with no success; RN administered PRNs for agitation; Pt pulling at railings and reaching for things outside of the bed-Monique,RN

## 2023-03-08 NOTE — ED Provider Notes (Signed)
Emergency Medicine Observation Re-evaluation Note  Natalie Brown is a 57 y.o. female, seen on rounds today.  Pt initially presented to the ED for complaints of Agitation Currently, the patient is awaiting placement.  Physical Exam  BP 122/68   Pulse 89   Temp 97.8 F (36.6 C) (Axillary)   Resp 16   Ht 5\' 6"  (1.676 m)   Wt 46 kg   SpO2 100%   BMI 16.37 kg/m  Physical Exam Awake and in nad  ED Course / MDM  EKG:EKG Interpretation Date/Time:  Saturday February 08 2023 14:48:01 EDT Ventricular Rate:  96 PR Interval:  130 QRS Duration:  68 QT Interval:  342 QTC Calculation: 432 R Axis:   79  Text Interpretation: Normal sinus rhythm Normal ECG When compared with ECG of 23-Nov-2022 04:13, PREVIOUS ECG IS PRESENT Confirmed by Kommor, [name] ([id number]) on 02/09/2023 11:46:25 AM  I have reviewed the labs performed to date as well as medications administered while in observation.  Recent changes in the last 24 hours include none  Plan  Current plan is for placement.    Bethann Berkshire, MD 03/08/23 1102

## 2023-03-09 NOTE — ED Provider Notes (Signed)
The patient and her family member were asking if we could stop giving the patient Haldol and Zyprexa.  I will consult behavioral health for medicine review   Bethann Berkshire, MD 03/09/23 2003

## 2023-03-09 NOTE — ED Provider Notes (Signed)
Emergency Medicine Observation Re-evaluation Note  Natalie Brown is a 57 y.o. female, seen on rounds today.  Pt initially presented to the ED for complaints of Agitation Currently, the patient is awaiting placement.  Physical Exam  BP (!) 167/90 (BP Location: Left Arm)   Pulse 90   Temp 98 F (36.7 C) (Axillary)   Resp 19   Ht 5\' 6"  (1.676 m)   Wt 46 kg   SpO2 100%   BMI 16.37 kg/m  Physical Exam Resting and in nad  ED Course / MDM  EKG:EKG Interpretation Date/Time:  Saturday February 08 2023 14:48:01 EDT Ventricular Rate:  96 PR Interval:  130 QRS Duration:  68 QT Interval:  342 QTC Calculation: 432 R Axis:   79  Text Interpretation: Normal sinus rhythm Normal ECG When compared with ECG of 23-Nov-2022 04:13, PREVIOUS ECG IS PRESENT Confirmed by Kommor, [name] ([id number]) on 02/09/2023 11:46:25 AM  I have reviewed the labs performed to date as well as medications administered while in observation.  Recent changes in the last 24 hours include none.  Plan  Current plan is for placement.    Bethann Berkshire, MD 03/09/23 [phone]

## 2023-03-09 NOTE — ED Notes (Signed)
Pt. Agitated and attempting to climb out of bed. Haldol given. Pt. Soiled brief, gown, and sheets replaced.

## 2023-03-09 NOTE — ED Notes (Signed)
Unable to obtain 0600 VS due to severe agitation. Will attempt when PRN medications take effect

## 2023-03-10 MED ORDER — OLANZAPINE 5 MG PO TBDP
2.5000 mg | ORAL_TABLET | Freq: Four times a day (QID) | ORAL | Status: DC | PRN
  Administered 2023-03-10: 2.5 mg via ORAL
  Filled 2023-03-10: qty 1

## 2023-03-10 MED ORDER — HYDROXYZINE HCL 25 MG PO TABS
25.0000 mg | ORAL_TABLET | Freq: Three times a day (TID) | ORAL | Status: DC | PRN
  Administered 2023-03-10 – 2023-03-19 (×7): 25 mg via ORAL
  Filled 2023-03-10 (×8): qty 1

## 2023-03-10 NOTE — ED Notes (Signed)
Pt found out of bed and walking unsteadily in purple zone. Pt assisted back to bed. Pt needed brief changed and once that was done, pt went back to sleep.

## 2023-03-10 NOTE — ED Provider Notes (Signed)
Emergency Medicine Observation Re-evaluation Note  Natalie Brown is a 57 y.o. female, seen on rounds today.  Pt initially presented to the ED for complaints of Agitation Currently, the patient is sleeping comfortably in ED bed.  Physical Exam  BP (!) 151/91 (BP Location: Left Arm)   Pulse (!) 105   Temp 98.2 F (36.8 C) (Oral)   Resp 20   Ht 5\' 6"  (1.676 m)   Wt 46 kg   SpO2 100%   BMI 16.37 kg/m  Physical Exam General: Awake. Alert. No acute distress Cardiac: Regular rate rhythm Lungs: Clear to auscultation bilaterally Psych: Calm and cooperative  ED Course / MDM  EKG:EKG Interpretation Date/Time:  Saturday February 08 2023 14:48:01 EDT Ventricular Rate:  96 PR Interval:  130 QRS Duration:  68 QT Interval:  342 QTC Calculation: 432 R Axis:   79  Text Interpretation: Normal sinus rhythm Normal ECG When compared with ECG of 23-Nov-2022 04:13, PREVIOUS ECG IS PRESENT Confirmed by Kommor, [name] ([id number]) on 02/09/2023 11:46:25 AM  I have reviewed the labs performed to date as well as medications administered while in observation.  Recent changes in the last 24 hours include no acute events.  Plan  Current plan is for continued boarding in the ED awaiting skilled nursing facility placement.    Royanne Foots, DO 03/10/23 [phone]

## 2023-03-10 NOTE — ED Notes (Signed)
Pt standing on bed and about to jump out of bed. PRN for agitation to be given.

## 2023-03-10 NOTE — Progress Notes (Signed)
CSW has outreached to [facility] to request an update on status of referral.

## 2023-03-10 NOTE — ED Notes (Signed)
Ensure received from pharmacy. Pt currently sleeping. Will administer when pt wakes up.

## 2023-03-10 NOTE — Consult Note (Signed)
  TTS consult placed due to mother/LG concerns that antipsychotic medications need to be discontinued. She feels like the sedation is too much and its causing the patient to lose weight and not eat meals.   I attempted to assess patient, however she was agitated and given PRN Ativan this morning. She was sleeping, and is difficult to engage with her regardless due to her dementia.   Majority of info and collateral was obtained from her mother, Natalie Brown.   Natalie Brown had concerns that since the patient was started on Haldol that she has lost 40 pounds and the Haldol is going to eventually kill her.  She also reported she has noticed the patient having some jerking movements that she is also blaming on the Haldol.  She was very passionate and explaining she did not want Natalie Brown on any psychiatric medications as she believes this is why Natalie Brown is doing so poorly.  I did educate Natalie Brown on her current scheduled medications versus as needed medications.  Explained we have continued to give Zyprexa 2.5 mg at bedtime and per chart review since initiating this medication it has helped decrease the frequency of agitation and severity.  I did explain that Haldol is an as needed medication for additional agitation support that appears to have been started around 1 week ago.  I do notice per chart the patient has been getting this Haldol as needed almost daily which could explain recent noted side effects of jerking movements.  Patient currently taking hydroxyzine 50 mg 3 times daily as needed for agitation.  Patient also receives Ativan 0.5 mg 3 times daily scheduled.  After thorough chart review and information from mother, explained that at this time I do feel that Zyprexa 2.5 nightly is needed while the patient remains in the ED setting as it has proven to help with her agitation levels.  However we will discontinue Haldol as needed for agitation as I do not feel like this is necessary especially if it could be  causing EPS.  Will decrease hydroxyzine to 25 mg 3 times daily as the patient has lost significant weight and I think the dose of 50 mg is too high for her current condition.  At this time we will continue lorazepam 0.5 mg 3 times daily scheduled.  Natalie Brown expressed her frustrations over patient continuing to be in the ED, and obviously would like her in a placement as soon as possible. Explained that is everyone's goal as well. Natalie Brown is agreeable with this plan above. Natalie Brown is requesting the ED do a better job of keeping in contact with her and providing updates.   Patient continues to board in the ED while placement is found for her.  TTS was consulted only for medication evaluation and not for disposition reasons.  Will remove patient from TTS list, please reconsult if needed.

## 2023-03-10 NOTE — ED Notes (Signed)
Pt currently sleeping; will hold meds due at this time until pt wakes up. (Multivitamin, ensure, prosource)

## 2023-03-10 NOTE — ED Notes (Signed)
Pt again standing on bed and trying to get out of bed. Pt repositioned in bed.

## 2023-03-10 NOTE — ED Notes (Signed)
Pt currently sleeping. Will give meds when she wakes up.

## 2023-03-10 NOTE — ED Notes (Addendum)
Pts given a full bed bath and provided with clean bedding, blankets and brief. Unable to obtain vitals signs other than temperature due to pt motion not allowing the monitor to read.

## 2023-03-10 NOTE — ED Notes (Addendum)
Pt awake and trying to get out of bed multiple times. Pt unable to follow staff direction or respond to safe limits. Pt agitated. Notified Effie Shy NP [facility])

## 2023-03-10 NOTE — ED Notes (Signed)
Messaged pharmacy regarding needing ensure sent to unit

## 2023-03-10 NOTE — ED Notes (Signed)
Pt agitated and standing on bed trying to jump off bed. Pt not calming down, unable to follow safe limits or follow commands. Pt brief was changed d/t BM and urinary incontinence, pt still very agitated after that. Tried to let pt sit in recliner and pt still remained agitated trying to leave out of the room. PRN meds given and restraint order for nonviolent restraints obtained for pt safety.

## 2023-03-10 NOTE — ED Notes (Signed)
Pt w/ multiple attempts to get out of bed, not able to follow directions or follow safety precautions. PRN med for agitation to be given.

## 2023-03-10 NOTE — ED Notes (Signed)
No sitters per staffing. Notified dayshift CN and nightshift CN d/t it being shift change at this time.

## 2023-03-11 LAB — CBC
HCT: 45.8 % (ref 36.0–46.0)
Hemoglobin: 14.6 g/dL (ref 12.0–15.0)
MCH: 29.3 pg (ref 26.0–34.0)
MCHC: 31.9 g/dL (ref 30.0–36.0)
MCV: 92 fL (ref 80.0–100.0)
Platelets: 285 10*3/uL (ref 150–400)
RBC: 4.98 MIL/uL (ref 3.87–5.11)
RDW: 13 % (ref 11.5–15.5)
WBC: 6.7 10*3/uL (ref 4.0–10.5)
nRBC: 0 % (ref 0.0–0.2)

## 2023-03-11 LAB — COMPREHENSIVE METABOLIC PANEL
ALT: 24 U/L (ref 0–44)
AST: 22 U/L (ref 15–41)
Albumin: 3.2 g/dL — ABNORMAL LOW (ref 3.5–5.0)
Alkaline Phosphatase: 62 U/L (ref 38–126)
Anion gap: 9 (ref 5–15)
BUN: 36 mg/dL — ABNORMAL HIGH (ref 6–20)
CO2: 25 mmol/L (ref 22–32)
Calcium: 8.9 mg/dL (ref 8.9–10.3)
Chloride: 111 mmol/L (ref 98–111)
Creatinine, Ser: 1.39 mg/dL — ABNORMAL HIGH (ref 0.44–1.00)
GFR, Estimated: 45 mL/min — ABNORMAL LOW (ref >60)
Glucose, Bld: 102 mg/dL — ABNORMAL HIGH (ref 70–99)
Potassium: 4.8 mmol/L (ref 3.5–5.1)
Sodium: 145 mmol/L (ref 135–145)
Total Bilirubin: 0.5 mg/dL (ref <1.2)
Total Protein: 7.1 g/dL (ref 6.5–8.1)

## 2023-03-11 NOTE — ED Notes (Signed)
Pt remains asleep at this time. Medications will be given when pt wakes up

## 2023-03-11 NOTE — ED Notes (Signed)
 Report received from Selinda Eon RN. Assumed care of pt at this time.

## 2023-03-11 NOTE — ED Provider Notes (Signed)
Emergency Medicine Observation Re-evaluation Note  Natalie Brown is a 57 y.o. female, seen on rounds today.  Pt initially presented to the ED for complaints of Agitation Currently, the patient is still attempting to ambulate and move around safely.  Physical Exam  BP 113/69   Pulse 92   Temp 98 F (36.7 C) (Oral)   Resp 14   Ht 5\' 6"  (1.676 m)   Wt 46 kg   SpO2 100%   BMI 16.37 kg/m  Physical Exam General: Resting comfortably in stretcher Lungs: Normal work of breathing Psych: Calm  ED Course / MDM  EKG:EKG Interpretation Date/Time:  Saturday February 08 2023 14:48:01 EDT Ventricular Rate:  96 PR Interval:  130 QRS Duration:  68 QT Interval:  342 QTC Calculation: 432 R Axis:   79  Text Interpretation: Normal sinus rhythm Normal ECG When compared with ECG of 23-Nov-2022 04:13, PREVIOUS ECG IS PRESENT Confirmed by Kommor, [name] ([id number]) on 02/09/2023 11:46:25 AM  I have reviewed the labs performed to date as well as medications administered while in observation.  Recent changes in the last 24 hours include required restraints after trying to jump out of bed. Mother concerned about weight loss and antipsychotics. [name] concerned that this may be due to the haldol.   Plan  Current plan is for placement. Psych Recommendations  - continue zyprexa 2.5 mg at bedtime - dc haldol 2/2 concerns for eps - hydroxyzine 25 mg tid prn - ativan 0.5mg  tid   Rondel Baton, MD 03/11/23 [phone]

## 2023-03-11 NOTE — ED Notes (Signed)
Pt's mother at bedside.

## 2023-03-11 NOTE — ED Notes (Signed)
Pt sleeping; will give meds when pt wakes up

## 2023-03-11 NOTE — ED Notes (Signed)
Messaged pharmacy about ensure d/t it not having been sent to unit yet

## 2023-03-11 NOTE — ED Notes (Addendum)
Pt placed back in restraints, this time soft non-violent 4 point restraints d/t kicking and climbing out of bed, unable to follow safe limites or be redirected. Mother at bedside the entire time. EDP notified

## 2023-03-11 NOTE — ED Notes (Addendum)
Pt was cleaned up x 1, assisted to chair w/ use of posey soft waist belt w/ Deanna, sitter. Pt managed to get out of restraint in 10 minutes by standing up in chair and pushing belt below waist and crawled out of the restraint. Pt was then assisted back to bed since pt was trying to walk to her bed. Pt was assisted onto bed, gown reapplied, pt positioned self for comfort. Pt's mother at bedside during the entire ordeal. No restraints on pt at this time.

## 2023-03-12 DIAGNOSIS — F03918 Unspecified dementia, unspecified severity, with other behavioral disturbance: Secondary | ICD-10-CM | POA: Diagnosis not present

## 2023-03-12 DIAGNOSIS — E440 Moderate protein-calorie malnutrition: Secondary | ICD-10-CM | POA: Diagnosis not present

## 2023-03-12 MED ORDER — ACETAMINOPHEN 500 MG PO TABS
1000.0000 mg | ORAL_TABLET | Freq: Three times a day (TID) | ORAL | Status: DC
  Administered 2023-03-12 – 2023-05-06 (×125): 1000 mg via ORAL
  Filled 2023-03-12 (×131): qty 2

## 2023-03-12 NOTE — Progress Notes (Signed)
Patient has no bed offers at this time.

## 2023-03-12 NOTE — ED Provider Notes (Signed)
Emergency Medicine Observation Re-evaluation Note  Natalie Brown is a 57 y.o. female, seen on rounds today.  Pt initially presented to the ED for complaints of Agitation Currently, the patient is asleep in her room  Physical Exam  BP 110/75 (BP Location: Left Leg)   Pulse (!) 118   Temp 98.6 F (37 C) (Axillary)   Resp 20   Ht 5\' 6"  (1.676 m)   Wt 46 kg   SpO2 100%   BMI 16.37 kg/m  Physical Exam Vitals and nursing note reviewed.  Constitutional:      General: [name] not in acute distress.    Appearance: Normal appearance. [name] well-developed.  HENT:     Head: Normocephalic and atraumatic.  Eyes:     Conjunctiva/sclera: Conjunctivae normal.  Cardiovascular:     Rate and Rhythm: Normal rate.  Pulmonary:     Effort: Pulmonary effort is normal.  Abdominal:     Tenderness: There is no abdominal tenderness.  Skin:    General: Skin is warm and dry.  Neurological:     Mental Status: [name] alert.     ED Course / MDM  EKG:EKG Interpretation Date/Time:  Saturday February 08 2023 14:48:01 EDT Ventricular Rate:  96 PR Interval:  130 QRS Duration:  68 QT Interval:  342 QTC Calculation: 432 R Axis:   79  Text Interpretation: Normal sinus rhythm Normal ECG When compared with ECG of 23-Nov-2022 04:13, PREVIOUS ECG IS PRESENT Confirmed by Kommor, [name] ([id number]) on 02/09/2023 11:46:25 AM  I have reviewed the labs performed to date as well as medications administered while in observation.  Recent changes in the last 24 hours include discontinuing the patient's Haldol per psych recommendations.  Plan  Current plan remains for placement.   Anders Simmonds T, DO 03/12/23 [phone]

## 2023-03-12 NOTE — Consult Note (Signed)
Consultation Note Date: 03/12/2023   Patient Name: Natalie Brown  DOB: 04-06-1966  MRN: [id number]  Age / Sex: 57 y.o., female  PCP: Natalie Corrigan [name], NP Referring Physician: System, Provider Not In  Reason for Consultation:  goals of care  HPI/Patient Profile: 57 y.o. female  with past medical history of HTN, bipolar 1 disorder with mania, catatonia, dementia presented to ED on 01/22/2023 from SNF due to SNF declining to accept her. This was after an over a year long hospitalization for catatonia- hospitalization was prolonged due to disposition. She has been in the ED since 10/2- now 48 days. Medically stable. Eating 40-100% of meals with assistance- requires feeding.  Intermittently in restraints. Psych has seen and is adjusting medications. She has had prolonged ED boarding due to disposition issues. Palliative consulted for assistance with improving quality of life per Dr. Rosalia Hammers.     Primary Decision Maker NEXT OF KIN mother [name]  Discussion: Chart reviewed including labs, progress notes, imaging from this and previous encounters.  Evaluated patient. She was sleeping. Per RN she was very combative yesterday and required restraints. [name] able to stand and ambulate, but sometimes falls an there is concern for her safety. [name] incontinent of urine and stool. She must be hand fed.  I called and spoke with patient's mother [name].  [name] has had decline during her stay in the ED. [name] less interactive, more agitated at times. When she was discharged she was able to walk and carry her lunch tray per her mother's report.  Room given to allow [name] to express stressors related to her daughter's chronic and progressive illness and difficulties with finding an appropriate care for her.  Some concern that [name] may be in pain that she can't express.  [name] shares feeling that [name] is going to die  eventually from her illness and it is difficult to see her in a suffering state. [name] not ready for hospice. [name] had considered trying to find an apartment where she could take patient home, however, due to not feeling support in finding home personal care resources for patient- she has determined that would not be the best plan.  [name] having frequent loose bowel movements.  Meals are charted that she is eating well- 50-100% of meals- however, per chart review she has lost 6 kg since 10/02.  MOST form is on chart that was completed during last admission- reviewed this with [name].   Cardiopulmonary Resuscitation: Do Not Attempt Resuscitation (DNR/No CPR)  Medical Interventions: Comfort Measures: Keep clean, warm, and dry. Use medication by any route, positioning, wound care, and other measures to relieve pain and suffering. Use oxygen, suction and manual treatment of airway obstruction as needed for comfort. Do not transfer to the hospital unless comfort needs cannot be met in current location.  Antibiotics: Antibiotics if indicated  IV Fluids: No IV fluids (provide other measures to ensure comfort)  Feeding Tube: No feeding tube    [name] enjoys R&B music- on prior  admission she would dance and listen to music when her sister visited.   SUMMARY OF RECOMMENDATIONS -Patient with mental status decline since initial presentation to ED- she has been in a small room with no windows for 48 days- it would definitely improve her status and quality of life if she could have a better setting- even trips outside in a wheechair with exposure to sunlight -Recommend standard delirium precautions as well:     Standard delirium management (adapted from NICE guidelines 2011 for prevention of delirium):  Provide continuity of care when possible (avoid frequent changing of surroundings and staff).  Frequent reorientation to time with:  A clock should always be visible.  Make sure Calendar/white board is  updated.  Lights on/blinds open during the day and off/closed at night.  Encourage frequent family visits.  Monitor for and treat dehydration/constipation.  Optimize oxygen saturation.  Avoid catheters and IV's when possible and look for/treat infections.  Encourage early mobility.  Assess and treat pain.  Ensure adequate nutrition and functioning dentures.  Address reversible causes of hearing and visual impairment:  Use pocket talker if hearing aids are unavailable.  Avoid sleep disturbance (normalize sleep/wake cycle).  Minimize disturbances and consider NOT obtaining vitals at night if possible.  Review Medications to avoid polypharmacy and avoid deliriogenic medications when possible:  Benzodiazepines.  Dihydropyridines.  Antihistamines.  Anticholinergics.  (Possibly avoid: H2 blockers, tricyclic antidepressants, antiparkinson medications, steroids, NSAID's).   -Will start acetaminophen 1000mg  TID for possible pain that is not able to be expressed -[name] having frequent bowel movements- but is being given miralax daily- will change miralax to PRN -Significant weight loss- 6kg since October 2nd- dietician consult ordered for possible supplements and recommendations- she would not want artificial feeding -Recommend playing patient's favorite music when able -She would likely benefit from visit from recreational therapy if her mental status allows cooperation and if this is available- will defer to attending and psychiatry -Recommend referral to [facility] or similar company for assistance with disposition - TOC order placed   Code Status/Advance Care Planning: DNR   Prognosis:   Unable to determine  Discharge Planning: To Be Determined  Primary Diagnoses: Present on Admission:  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)  Bipolar 1 disorder (HCC)   Review of Systems  Unable to perform ROS   Physical Exam Vitals and nursing note reviewed.   Constitutional:      Appearance: [name] ill-appearing.  Neurological:     Comments: sleeping     Vital Signs: BP 100/86 (BP Location: Left Arm)   Pulse 69   Temp (!) 97.4 F (36.3 C) (Oral)   Resp 20   Ht 5\' 6"  (1.676 m)   Wt 46 kg   SpO2 100%   BMI 16.37 kg/m  Pain Scale: 0-10   Pain Score: Asleep   SpO2: SpO2: 100 % O2 Device:SpO2: 100 % O2 Flow Rate: .   IO: Intake/output summary:  Intake/Output Summary (Last 24 hours) at 03/12/2023 1023 Last data filed at 03/12/2023 0900 Gross per 24 hour  Intake 520 ml  Output --  Net 520 ml    LBM: Last BM Date : 03/11/23 Baseline Weight: Weight: 46 kg Most recent weight: Weight: 46 kg       Thank you for this consult. Palliative medicine will continue to follow and assist as needed.  Time Total: 120 minutes Signed by: Ocie Bob, AGNP-C Palliative Medicine  Time includes:   Preparing to see the  patient (e.g., review of tests) Obtaining and/or reviewing separately obtained history Performing a medically necessary appropriate examination and/or evaluation Counseling and educating the patient/family/caregiver Ordering medications, tests, or procedures Referring and communicating with other health care professionals (when not reported separately) Documenting clinical information in the electronic or other health record Independently interpreting results (not reported separately) and communicating results to the patient/family/caregiver Care coordination (not reported separately) Clinical documentation   Please contact Palliative Medicine Team phone at [phone] for questions and concerns.  For individual provider: See Loretha Stapler

## 2023-03-12 NOTE — ED Notes (Signed)
Patient attempting to stand in bed with restraints on; pt ate full ice cream with meds; Pt continues to throw body all over the bed and tries to exit the bed; Sitter at [employer]

## 2023-03-12 NOTE — ED Notes (Signed)
Pt mother called for update, stated she did not come today due to being sick and would try to come tomorrow.

## 2023-03-13 DIAGNOSIS — F03918 Unspecified dementia, unspecified severity, with other behavioral disturbance: Secondary | ICD-10-CM

## 2023-03-13 DIAGNOSIS — F319 Bipolar disorder, unspecified: Secondary | ICD-10-CM

## 2023-03-13 DIAGNOSIS — S51011A Laceration without foreign body of right elbow, initial encounter: Secondary | ICD-10-CM | POA: Diagnosis not present

## 2023-03-13 DIAGNOSIS — Z515 Encounter for palliative care: Secondary | ICD-10-CM

## 2023-03-13 DIAGNOSIS — E8809 Other disorders of plasma-protein metabolism, not elsewhere classified: Secondary | ICD-10-CM | POA: Diagnosis present

## 2023-03-13 DIAGNOSIS — R7989 Other specified abnormal findings of blood chemistry: Secondary | ICD-10-CM

## 2023-03-13 DIAGNOSIS — R64 Cachexia: Secondary | ICD-10-CM | POA: Diagnosis present

## 2023-03-13 DIAGNOSIS — F03C4 Unspecified dementia, severe, with anxiety: Secondary | ICD-10-CM | POA: Diagnosis present

## 2023-03-13 DIAGNOSIS — Z66 Do not resuscitate: Secondary | ICD-10-CM | POA: Diagnosis present

## 2023-03-13 DIAGNOSIS — R45851 Suicidal ideations: Secondary | ICD-10-CM | POA: Diagnosis present

## 2023-03-13 DIAGNOSIS — E870 Hyperosmolality and hypernatremia: Secondary | ICD-10-CM | POA: Diagnosis present

## 2023-03-13 DIAGNOSIS — R451 Restlessness and agitation: Secondary | ICD-10-CM | POA: Diagnosis not present

## 2023-03-13 DIAGNOSIS — Z79899 Other long term (current) drug therapy: Secondary | ICD-10-CM | POA: Diagnosis not present

## 2023-03-13 DIAGNOSIS — I10 Essential (primary) hypertension: Secondary | ICD-10-CM | POA: Diagnosis not present

## 2023-03-13 DIAGNOSIS — E43 Unspecified severe protein-calorie malnutrition: Secondary | ICD-10-CM | POA: Diagnosis present

## 2023-03-13 DIAGNOSIS — I129 Hypertensive chronic kidney disease with stage 1 through stage 4 chronic kidney disease, or unspecified chronic kidney disease: Secondary | ICD-10-CM | POA: Diagnosis present

## 2023-03-13 DIAGNOSIS — N1831 Chronic kidney disease, stage 3a: Secondary | ICD-10-CM | POA: Diagnosis present

## 2023-03-13 DIAGNOSIS — R627 Adult failure to thrive: Secondary | ICD-10-CM | POA: Diagnosis present

## 2023-03-13 DIAGNOSIS — R000 Tachycardia, unspecified: Secondary | ICD-10-CM

## 2023-03-13 DIAGNOSIS — E440 Moderate protein-calorie malnutrition: Secondary | ICD-10-CM | POA: Diagnosis not present

## 2023-03-13 DIAGNOSIS — R4189 Other symptoms and signs involving cognitive functions and awareness: Secondary | ICD-10-CM | POA: Diagnosis present

## 2023-03-13 DIAGNOSIS — E871 Hypo-osmolality and hyponatremia: Secondary | ICD-10-CM | POA: Diagnosis present

## 2023-03-13 DIAGNOSIS — Z681 Body mass index (BMI) 19 or less, adult: Secondary | ICD-10-CM | POA: Diagnosis not present

## 2023-03-13 DIAGNOSIS — W06XXXA Fall from bed, initial encounter: Secondary | ICD-10-CM | POA: Diagnosis not present

## 2023-03-13 DIAGNOSIS — R410 Disorientation, unspecified: Secondary | ICD-10-CM | POA: Diagnosis not present

## 2023-03-13 DIAGNOSIS — Z7189 Other specified counseling: Secondary | ICD-10-CM | POA: Diagnosis not present

## 2023-03-13 DIAGNOSIS — Z781 Physical restraint status: Secondary | ICD-10-CM | POA: Diagnosis not present

## 2023-03-13 DIAGNOSIS — Y92230 Patient room in hospital as the place of occurrence of the external cause: Secondary | ICD-10-CM | POA: Diagnosis not present

## 2023-03-13 DIAGNOSIS — F03C3 Unspecified dementia, severe, with mood disturbance: Secondary | ICD-10-CM | POA: Diagnosis present

## 2023-03-13 DIAGNOSIS — Z8616 Personal history of COVID-19: Secondary | ICD-10-CM | POA: Diagnosis not present

## 2023-03-13 DIAGNOSIS — F3110 Bipolar disorder, current episode manic without psychotic features, unspecified: Secondary | ICD-10-CM | POA: Diagnosis not present

## 2023-03-13 DIAGNOSIS — G9340 Encephalopathy, unspecified: Secondary | ICD-10-CM | POA: Diagnosis present

## 2023-03-13 DIAGNOSIS — E860 Dehydration: Secondary | ICD-10-CM | POA: Diagnosis present

## 2023-03-13 DIAGNOSIS — F03C18 Unspecified dementia, severe, with other behavioral disturbance: Secondary | ICD-10-CM | POA: Diagnosis present

## 2023-03-13 MED ORDER — OLANZAPINE 2.5 MG PO TABS
2.5000 mg | ORAL_TABLET | Freq: Every day | ORAL | Status: DC
  Administered 2023-03-13 – 2023-03-19 (×7): 2.5 mg via ORAL
  Filled 2023-03-13 (×7): qty 1

## 2023-03-13 MED ORDER — ACETAMINOPHEN 650 MG RE SUPP: 650.0000 mg | Freq: Four times a day (QID) | RECTAL | Status: DC | PRN

## 2023-03-13 MED ORDER — QUETIAPINE FUMARATE 50 MG PO TABS: 50.0000 mg | ORAL_TABLET | Freq: Every day | ORAL | Status: DC

## 2023-03-13 MED ORDER — OLANZAPINE 2.5 MG PO TABS
2.5000 mg | ORAL_TABLET | Freq: Four times a day (QID) | ORAL | Status: DC | PRN
  Administered 2023-03-13 – 2023-03-17 (×3): 2.5 mg via ORAL
  Filled 2023-03-13 (×4): qty 1

## 2023-03-13 MED ORDER — ACETAMINOPHEN 325 MG PO TABS
650.0000 mg | ORAL_TABLET | Freq: Four times a day (QID) | ORAL | Status: DC | PRN
  Administered 2023-04-21: 650 mg via ORAL

## 2023-03-13 MED ORDER — LORAZEPAM 1 MG PO TABS
1.0000 mg | ORAL_TABLET | Freq: Three times a day (TID) | ORAL | Status: DC
  Administered 2023-03-13 – 2023-03-15 (×6): 1 mg via ORAL
  Filled 2023-03-13 (×6): qty 1

## 2023-03-13 MED ORDER — SODIUM CHLORIDE 0.9% FLUSH
3.0000 mL | Freq: Two times a day (BID) | INTRAVENOUS | Status: DC
  Administered 2023-03-13 – 2023-03-27 (×12): 3 mL via INTRAVENOUS

## 2023-03-13 MED ORDER — ENOXAPARIN SODIUM 30 MG/0.3ML IJ SOSY
30.0000 mg | PREFILLED_SYRINGE | INTRAMUSCULAR | Status: DC
  Administered 2023-03-13 – 2023-05-04 (×48): 30 mg via SUBCUTANEOUS
  Filled 2023-03-13 (×49): qty 0.3

## 2023-03-13 NOTE — ED Provider Notes (Signed)
Chart reviewed.   Prior recommendations from neurology to d/c zyprexa and use seroquel at bedtime. I have d/c'd zyprexa- of note black box warning and prior recommendations. Will add seroquel 50 mg at bedtime Would advise continue regular ativan as scheduled tid  Palliative care evaluated patient yesterday and multiple recommendations including below:  SUMMARY OF RECOMMENDATIONS -Patient with mental status decline since initial presentation to ED- she has been in a small room with no windows for 48 days- it would definitely improve her status and quality of life if she could have a better setting- even trips outside in a wheechair with exposure to sunlight -Recommend standard delirium precautions as well:      Additionally psych recommends diurnal variation to be maximized.     Margarita Grizzle, MD 03/13/23 1235

## 2023-03-13 NOTE — Progress Notes (Signed)
Daily Progress Note   Patient Name: Natalie Brown       Date: 03/13/2023 DOB: 01/05/1966  Age: 57 y.o. MRN#: [id number] Attending Physician: System, Provider Not In Primary Care Physician: Lavinia Sharps, NP Admit Date: 01/22/2023  Reason for Consultation/Follow-up: Establishing goals of care and symptom management  Patient Profile/HPI:    57 y.o. female  with past medical history of HTN, bipolar 1 disorder with mania, catatonia, dementia presented to ED on 01/22/2023 from SNF due to SNF declining to accept her. This was after an over a year long hospitalization for catatonia- hospitalization was prolonged due to disposition. She has been in the ED since 10/2- now 48 days. Medically stable. Eating 40-100% of meals with assistance- requires feeding.  Intermittently in restraints. Psych has seen and is adjusting medications. She has had prolonged ED boarding due to disposition issues. Palliative consulted for assistance with improving quality of life per Dr. Rosalia Hammers.   Subjective: Chart reviewed including labs, progress notes, imaging from this and previous encounters.  Case discussed with attending, psychiatry and Palliative medical team- Dr. Phillips Odor. Evaluated patient- she was sleeping.  Per nursing: She was awake this morning and ate 100 percent of her breakfast. [name] able to intermittently follow directions and interact. [name] able to ambulate and nursing reports she is very strong. Per chart review, nursing and mother report - on day of discharge during previous admission she was ambulating and carried her own lunch tray to the lunch cart. I spoke again to patient's mother regarding goals of care.  Goals are for stabilization and good quality of life. We reviewed her labs and noted she may be  dehydrated, have an acute kidney injury, low albumin and her sodium is increasing. Her mother is agreeable to hospital admission and IV fluids if needed.     Review of Systems  Unable to perform ROS: Other     Physical Exam Vitals and nursing note reviewed.  Constitutional:      Comments: Thin  Pulmonary:     Effort: Pulmonary effort is normal.  Neurological:     Comments: sleeping             Vital Signs: BP 100/86 (BP Location: Left Arm)   Pulse 69   Temp (!) 97.4 F (36.3 C) (Axillary)  Resp 20   Ht 5\' 6"  (1.676 m)   Wt (S) 40.9 kg   SpO2 100%   BMI 14.55 kg/m  SpO2: SpO2: 100 % O2 Device: O2 Device: Room Air O2 Flow Rate:    Intake/output summary:  Intake/Output Summary (Last 24 hours) at 03/13/2023 1028 Last data filed at 03/12/2023 2141 Gross per 24 hour  Intake 480 ml  Output 0 ml  Net 480 ml   LBM: Last BM Date : 03/11/23 Baseline Weight: Weight: 46 kg Most recent weight: Weight: (S) 40.9 kg       Palliative Assessment/Data: PPS: 40%      Patient Active Problem List   Diagnosis Date Noted   Agitation 02/09/2023   Protein-calorie malnutrition, severe 11/16/2022   B12 deficiency 02/19/2022   COVID-19 virus infection 02/19/2022   Drooling 02/19/2022   Essential hypertension 02/19/2022   Altered mental state 12/26/2021   Sinus tachycardia 12/25/2021   Tobacco abuse 12/24/2021   Delirium due to multiple etiologies, acute, hyperactive 12/24/2021   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) 11/11/2021   Bipolar affective disorder, current episode manic with psychotic symptoms (HCC) 09/12/2021   Dental caries 06/29/2015   Cervical cancer screening 02/22/2014   Current smoker 02/22/2014   Urge incontinence of urine 12/30/2013   Need for prophylactic vaccination and inoculation against influenza 12/30/2013   Bipolar 1 disorder (HCC) 12/30/2013    Palliative Care Assessment & Plan    Assessment/Recommendations/Plan  Appreciate  psychiatric team evaluating and following patient to assist with agitation episodes In discussion with Dr. Phillips Odor- recommend patient admission for electrolyte disturbances- patient's mother in agreement with IV fluids and treatment She would also benefit from further intense neurologic workup Appreciate TOC efforts in assisting patient's mother in transitioning patient from [name] to [facility] Medicaid   Code Status: DNR  Prognosis:  Unable to determine  Discharge Planning: To Be Determined  Care plan was discussed with patient's mother and care team.   Thank you for allowing the Palliative Medicine Team to assist in the care of this patient.  Total time: 90 minutes Prolonged billing:  Time includes:   Preparing to see the patient (e.g., review of tests) Obtaining and/or reviewing separately obtained history Performing a medically necessary appropriate examination and/or evaluation Counseling and educating the patient/family/caregiver Ordering medications, tests, or procedures Referring and communicating with other health care professionals (when not reported separately) Documenting clinical information in the electronic or other health record Independently interpreting results (not reported separately) and communicating results to the patient/family/caregiver Care coordination (not reported separately) Clinical documentation  Ocie Bob, AGNP-C Palliative Medicine   Please contact Palliative Medicine Team phone at [phone] for questions and concerns.

## 2023-03-13 NOTE — ED Provider Notes (Addendum)
Emergency Medicine Observation Re-evaluation Note  Natalie Brown is a 57 y.o. female, seen on rounds today.  Pt initially presented to the ED for complaints of Agitation Currently, the patient is sleeping in her room.  Physical Exam  BP 100/86 (BP Location: Left Arm)   Pulse 69   Temp (!) 97.4 F (36.3 C) (Axillary)   Resp 20   Ht 5\' 6"  (1.676 m)   Wt (S) 40.9 kg   SpO2 100%   BMI 14.55 kg/m  Physical Exam General: No acute distress Cardiac: Well-perfused Lungs: Nonlabored Psych: Calm  ED Course / MDM  EKG:EKG Interpretation Date/Time:  Saturday February 08 2023 14:48:01 EDT Ventricular Rate:  96 PR Interval:  130 QRS Duration:  68 QT Interval:  342 QTC Calculation: 432 R Axis:   79  Text Interpretation: Normal sinus rhythm Normal ECG When compared with ECG of 23-Nov-2022 04:13, PREVIOUS ECG IS PRESENT Confirmed by Kommor, [name] ([id number]) on 02/09/2023 11:46:25 AM  I have reviewed the labs performed to date as well as medications administered while in observation.  Recent changes in the last 24 hours include seen by palliative care.  Ongoing bed search.  Episodes of agitation yesterday.  Plan  Current plan is for bed search.  10 AM.  Hospice and psychiatry working on plan to help with patient's agitation and need for being in restraints 10:45 AM.  Am being asked by the best palliative attending to pursue a medical admission for the patient.  They feel she is declining wellbeing in the emergency department and would benefit from inpatient management and ongoing psychiatric care.  Have placed call into hospitalist  1:13 PM.  Discussed with [facility] hospitalist Dr. Alinda Money who will evaluate patient for admission.  Terrilee Files, MD 03/13/23 5732    Terrilee Files, MD 03/13/23 2035

## 2023-03-13 NOTE — Progress Notes (Signed)
See Palliative Care Note from Ocie Bob, NP for additional details.  Case discussed at length with palliative care NP and Psychiatry NP. She has a complicated neuropsychiatric history and what appears to be insurmountable social determinants to providing a safe and therapeutic living environment. She has been holding in the ED for 49 days. She requires intermittent restraints. 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 Scheduled Tylenol, often patient's cannot articulate pain and it presents as intermittent agitation Consider addition of Gabapentin as both a mood stabilizer and for neuropathic pain and agitation. Appreciate Psychiatry recommendations for chronic and acute meds and management, Needs Medical Admission for failure to thrive and for monitoroing environment as we further adjust medications for her symptoms. Her delirium will be difficult to manage in the ED especially with non pharmacologic interventions. Significant TOC needs- will need LTC placement in a memory care setting- her current medicaid plan may not cover this- need to explore medicaid coverage options and support her legal guardian/mother in obtaining proper coverage.   Anderson Malta, DO Palliative Medicine

## 2023-03-13 NOTE — H&P (Signed)
History and Physical   Natalie Brown:[id number] DOB: [birth year] DOA: 01/22/2023  PCP: Lavinia Sharps, NP   Patient coming from: ED (had previous admission for 1 year, very brief stay at rehab facility, now in the ED for the last 49 days)  Chief Complaint: Agitation/delirium  HPI/course: Natalie Brown is a 57 y.o. female with medical history significant of hypertension, sinus tachycardia, bipolar disorder, dementia with behavior disturbance presenting with recurrent agitation.  As above, patient was admitted for about a year with similar presentation and was finally able to be placed about a month and a half ago. (See discharge summary for more details.)  Patient was only very briefly at facility when she was sent back to the ED for recurrent agitation.  Has been in the ED here for 49 days and has had attempts to place that have been unsuccessful.  Has been recently evaluated by psychiatry due to worsening mental status in the ED.  11/14 continue to monitor plan for placement.  On 11/17 family was concerned about Zyprexa and Haldol especially Haldol with concerns of patient could be experiencing side effects and could be worsening her kidney condition.  Psychiatry was consulted and saw the patient on 11/18.  Recommended discontinuation of Haldol will continue with Zyprexa as well as Ativan and maximizing hydroxyzine.  Placement attempts were continued.  Today patient seen by palliative medicine and psychiatry with recommendations for maximizing Ativan and continuing with Zyprexa as well as hydroxyzine.  Patient is DNR/DNI.  Admission is requested to try improving patient environment, provide window so patient can have improvement in circadian rhythm.  Which may help improve psychiatric status.  Psychiatry palliative to continue to follow.    Patient unable to participate in review of systems due to encephalopathy.  Vital signs in ED and remained largely stable with blood pressure in  the 90s to 110s and heart rate in the 80s to 110s recently.  Last lab work was 2 days ago with CMP showing creatinine of 1.39 up from baseline of 1-1.2, BUN 36, glucose 102, albumin 3.2.  CBC within normal limits.  No imaging.  Review of Systems: Patient unable to participate in review of systems due to encephalopathy.  Past Medical History:  Diagnosis Date   Bipolar 1 disorder with moderate mania (HCC) 12/30/2013   Hypertension     History reviewed. No pertinent surgical history.  Social History  reports that she has been smoking. She uses smokeless tobacco. She reports that she does not drink alcohol and does not use drugs.  Allergies  Allergen Reactions   Penicillins Itching    History reviewed. No pertinent family history.  Prior to Admission medications   Medication Sig Start Date End Date Taking? Authorizing Provider  Cyanocobalamin (B-12 COMPLIANCE INJECTION IJ) Inject as directed.   Yes [provider]  feeding supplement (ENSURE ENLIVE / ENSURE PLUS) LIQD Take 237 mLs by mouth 2 (two) times daily between meals. 01/22/23  Yes Lanae Boast, MD  lactulose (CHRONULAC) 10 GM/15ML solution Take 30 mLs (20 g total) by mouth 2 (two) times daily as needed for mild constipation. 01/22/23  Yes Lanae Boast, MD  LORazepam (ATIVAN) 2 MG/ML concentrated solution Take 0.5 mLs (1 mg total) by mouth 3 (three) times daily. 01/22/23  Yes Lanae Boast, MD  Multiple Vitamin (MULTIVITAMIN WITH MINERALS) TABS tablet Take 1 tablet by mouth daily. 01/23/23  Yes Lanae Boast, MD  Nutritional Supplements (,FEEDING SUPPLEMENT, PROSOURCE PLUS) liquid Take 30 mLs by mouth 2 (  two) times daily between meals. 01/22/23  Yes Kc, Dayna Barker, MD  polyethylene glycol (MIRALAX / GLYCOLAX) 17 g packet Take 17 g by mouth daily. Patient taking differently: Take 17 g by mouth daily as needed for mild constipation. 01/23/23  Yes Lanae Boast, MD  Incontinence Supply Disposable (BRIEFS OVERNIGHT MEDIUM) MISC 1 each by Does not  apply route every 4 (four) hours as needed (soiling). 06/10/22   Dorcas Carrow, MD    Physical Exam: Vitals:   03/11/23 1500 03/11/23 2050 03/12/23 1017 03/12/23 1218  BP: 92/81 110/75 100/86   Pulse: 85 (!) 118 69   Resp: 16 20 20    Temp: 98.8 F (37.1 C) 98.6 F (37 C) (!) 97.4 F (36.3 C)   TempSrc: Tympanic Axillary Axillary   SpO2: 100% 100% 100%   Weight:    (S) 40.9 kg  Height:        Physical Exam Constitutional:      General: [name] not in acute distress.    Appearance: Normal appearance.  HENT:     Head: Normocephalic and atraumatic.     Mouth/Throat:     Mouth: Mucous membranes are moist.     Pharynx: Oropharynx is clear.  Eyes:     Extraocular Movements: Extraocular movements intact.     Pupils: Pupils are equal, round, and reactive to light.  Cardiovascular:     Rate and Rhythm: Regular rhythm. Tachycardia present.     Pulses: Normal pulses.     Heart sounds: Normal heart sounds.  Pulmonary:     Effort: Pulmonary effort is normal. No respiratory distress.     Breath sounds: Normal breath sounds.  Abdominal:     General: Bowel sounds are normal. There is no distension.     Palpations: Abdomen is soft.     Tenderness: There is no abdominal tenderness.  Musculoskeletal:        General: No swelling or deformity.  Skin:    General: Skin is warm and dry.  Neurological:     General: No focal deficit present.     Comments: Agitated and moving arms and legs but not speaking.  Some thrashing about and kicking at staff.  Attempting to get out of bed and not responding to requests to stay in bed.    Labs on Admission: I have personally reviewed following labs and imaging studies  CBC: Recent Labs  Lab 03/11/23 0450  WBC 6.7  HGB 14.6  HCT 45.8  MCV 92.0  PLT 285    Basic Metabolic Panel: Recent Labs  Lab 03/11/23 0450  NA 145  K 4.8  CL 111  CO2 25  GLUCOSE 102*  BUN 36*  CREATININE 1.39*  CALCIUM 8.9    GFR: Estimated Creatinine  Clearance: 29.5 mL/min (A) (by C-G formula based on SCr of 1.39 mg/dL (H)).  Liver Function Tests: Recent Labs  Lab 03/11/23 0450  AST 22  ALT 24  ALKPHOS 62  BILITOT 0.5  PROT 7.1  ALBUMIN 3.2*    Urine analysis:    Component Value Date/Time   COLORURINE YELLOW 06/28/2022 0630   APPEARANCEUR CLEAR 06/28/2022 0630   LABSPEC 1.021 06/28/2022 0630   PHURINE 5.0 06/28/2022 0630   GLUCOSEU NEGATIVE 06/28/2022 0630   HGBUR NEGATIVE 06/28/2022 0630   BILIRUBINUR NEGATIVE 06/28/2022 0630   BILIRUBINUR neg 08/07/2016 1526   KETONESUR NEGATIVE 06/28/2022 0630   PROTEINUR NEGATIVE 06/28/2022 0630   UROBILINOGEN 2.0 (A) 08/07/2016 1526   UROBILINOGEN 1 12/30/2013 1216  NITRITE NEGATIVE 06/28/2022 0630   LEUKOCYTESUR NEGATIVE 06/28/2022 0630    Radiological Exams on Admission: No results found.  EKG: Independently reviewed.  Last EKG was 2 days ago and showed sinus rhythm at 96 bpm.  Normal QTc of 432.  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  Concerned that being in rooms with no windows is contributing. > Has been evaluated by psychiatry and palliative medicine with request for admission to provide better regulation of sleep and circadian rhythm.  And hopefully improve her psychiatric status. > Palliative medicine and Psychiatry following.  Psychiatry recommending continuing Zyprexa, Ativan, hydroxyzine with continued safety observation. - Monitor MedSurg - Continue safety observation - Continue with scheduled Zyprexa at night - Continue with scheduled Ativan - Continue as needed Zyprexa  and hydroxyzine - Supportive care  Sinus tachycardia - Known history of this  Weight loss Nutritional deficiencies - Continue with diet, supplemental nutrition, vitamins  DVT prophylaxis: Lovenox Code Status:   DNR/DNI Family Communication:  Updated at bedside Disposition Plan:   Patient is from:  ED/facility before that/hospital for that  Anticipated DC to:  Facility with memory unit  Anticipated DC date:  Undetermined at this time  Anticipated DC barriers: Placement  Consults called:  Bowel medicine, psychiatry Admission status:  Inpatient, MedSurg  Severity of Illness: The appropriate patient status for this patient is INPATIENT. Inpatient status is judged to be reasonable and necessary in order to provide the required intensity of service to ensure the patient's safety. The patient's presenting symptoms, physical exam findings, and initial radiographic and laboratory data in the context of their chronic comorbidities is felt to place them at high risk for further clinical deterioration. Furthermore, it is not anticipated that the patient will be medically stable for discharge from the hospital within 2 midnights of admission.   * I certify that at the point of admission it is my clinical judgment that the patient will require inpatient hospital care spanning beyond 2 midnights from the point of admission due to high intensity of service, high risk for further deterioration and high frequency of surveillance required.Synetta Fail MD [facility]  How to contact the [facility] Attending or Consulting provider 7A - 7P or covering provider during after hours 7P -7A, for this patient?   Check the care team in [facility] and look for a) attending/consulting [facility] provider listed and b) the [facility] team listed Log into [email or web address] and use [place]'s universal password to access. If you do not have the password, please contact the hospital operator. Locate the [facility] provider you are  looking for under [facility] and page to a number that you can be directly reached. If you still have difficulty reaching the provider, please page the [facility] (Director on Call) for the Hospitalists listed on amion for assistance.  03/13/2023, 1:32 PM

## 2023-03-13 NOTE — ED Notes (Signed)
ED TO INPATIENT HANDOFF REPORT  ED Nurse Name and Phone #: [phone]  S Name/Age/Gender Natalie Brown 57 y.o. female Room/Bed: 048C/048C  Code Status   Code Status: Limited: Do not attempt resuscitation (DNR) -DNR-LIMITED -Do Not Intubate/DNI   Home/SNF/Other Boarder in the emergency room Patient oriented to: disoriented Is this baseline?  Patient is disoriented person, place, time and situation     Chief Complaint Delirium [R41.0]  Triage Note Vitals for PTAR 110/76  RR18 O2 100% HR 102  Left [place] today, PTAR was taking to the SNF for the first time after being here for 362 days waiting for placement.  PTAR got her off the stretcher and as soon as they walked away she became agitated hitting staff and screaming so they made PTAR bring her back here because they stated they couldn't care for her.     Allergies Allergies  Allergen Reactions   Penicillins Itching    Level of Care/Admitting Diagnosis ED Disposition     ED Disposition  Admit   Condition  --   Comment  Hospital Area: [facility] [[id number]]  Level of Care: Med-Surg [16]  May admit patient to [facility] or [facility] if equivalent level of care is available:: Yes  Covid Evaluation: Asymptomatic - no recent exposure (last 10 days) testing not required  Diagnosis: Delirium [789381]  Admitting Physician: Synetta Fail [[id number]]  Attending Physician: Synetta Fail [[id number]]  Certification:: I certify this patient will need inpatient services for at least 2 midnights  Expected Medical Readiness: 03/17/2023          B Medical/Surgery History Past Medical History:  Diagnosis Date   Bipolar 1 disorder with moderate mania (HCC) 12/30/2013   Hypertension    History reviewed. No pertinent surgical history.   A IV Location/Drains/Wounds Patient Lines/Drains/Airways Status     Active Line/Drains/Airways     None            Intake/Output Last 24  hours  Intake/Output Summary (Last 24 hours) at 03/13/2023 1848 Last data filed at 03/13/2023 1754 Gross per 24 hour  Intake 360 ml  Output --  Net 360 ml    Labs/Imaging No results found for this or any previous visit (from the past 48 hour(s)). No results found.  Pending Labs Unresulted Labs (From admission, onward)     Start     Ordered   03/13/23 1330  Comprehensive metabolic panel  Once,   R        03/13/23 1330   03/13/23 1330  CBC  Once,   R        03/13/23 1330   03/13/23 1326  HIV Antibody (routine testing w rflx)  (HIV Antibody (Routine testing w reflex) panel)  Once,   R        03/13/23 1330            Vitals/Pain Today's Vitals   03/12/23 1017 03/12/23 1218 03/12/23 2312 03/13/23 1815  BP: 100/86   122/74  Pulse: 69     Resp: 20   18  Temp: (!) 97.4 F (36.3 C)   (!) 97.5 F (36.4 C)  TempSrc: Axillary   Oral  SpO2: 100%   99%  Weight:  (S) 40.9 kg    Height:      PainSc:   Asleep     Isolation Precautions No active isolations  Medications Medications  lactulose (CHRONULAC) 10 GM/15ML solution 20 g (has no administration in time  range)  (feeding supplement) PROSource Plus liquid 30 mL (30 mLs Oral Given 03/13/23 1828)  multivitamin with minerals tablet 1 tablet (1 tablet Oral Given 03/13/23 1319)  feeding supplement (ENSURE ENLIVE / ENSURE PLUS) liquid 237 mL (237 mLs Oral Given 03/13/23 1515)  hydrOXYzine (ATARAX) tablet 25 mg (25 mg Oral Given 03/13/23 1826)  acetaminophen (TYLENOL) tablet 1,000 mg (1,000 mg Oral Not Given 03/13/23 1600)  LORazepam (ATIVAN) tablet 1 mg (1 mg Oral Given 03/13/23 1319)  OLANZapine (ZYPREXA) tablet 2.5 mg (has no administration in time range)  OLANZapine (ZYPREXA) tablet 2.5 mg (2.5 mg Oral Given 03/13/23 1436)  enoxaparin (LOVENOX) injection 30 mg (has no administration in time range)  sodium chloride flush (NS) 0.9 % injection 3 mL (3 mLs Intravenous Not Given 03/13/23 1639)  acetaminophen (TYLENOL) tablet  650 mg (has no administration in time range)    Or  acetaminophen (TYLENOL) suppository 650 mg (has no administration in time range)  diazepam (VALIUM) injection 5 mg (5 mg Intravenous Given 01/23/23 1254)  diphenhydrAMINE (BENADRYL) injection 50 mg (50 mg Intramuscular Given 01/23/23 1143)  haloperidol lactate (HALDOL) injection 5 mg (5 mg Intramuscular Given 01/23/23 1143)  haloperidol lactate (HALDOL) injection 5 mg (5 mg Intramuscular Given 02/04/23 0304)  midazolam (VERSED) injection 2 mg (2 mg Intramuscular Given 02/04/23 0304)  diphenhydrAMINE (BENADRYL) injection 25 mg (25 mg Intramuscular Given 02/04/23 0304)  haloperidol lactate (HALDOL) injection 2.5 mg (2.5 mg Intramuscular Given 02/07/23 1649)  LORazepam (ATIVAN) tablet 0.5 mg (0.5 mg Oral Given 02/11/23 1542)  LORazepam (ATIVAN) tablet 1 mg (1 mg Oral Given 02/12/23 0819)  ziprasidone (GEODON) injection 20 mg (20 mg Intramuscular Given 02/14/23 0547)  sterile water (preservative free) injection (1.2 mLs  Given 02/14/23 0547)  ziprasidone (GEODON) injection 20 mg (20 mg Intramuscular Given 02/20/23 0455)  sterile water (preservative free) injection (10 mLs  Given 02/20/23 0456)  LORazepam (ATIVAN) injection 1 mg (1 mg Intramuscular Given 02/25/23 1145)  OLANZapine (ZYPREXA) injection 2.5 mg (2.5 mg Intramuscular Given 03/02/23 0722)  OLANZapine zydis (ZYPREXA) disintegrating tablet 5 mg (5 mg Oral Given 03/06/23 0924)  LORazepam (ATIVAN) injection 2 mg (2 mg Intramuscular Given 03/06/23 1449)    Mobility walks with person assist     Focused Assessments Agitation   R Recommendations: See Admitting Provider Note  Report given to:   Additional Notes: Patient is a fall risk, she can get up with assist. She as soft restraint wrist and ankle. She was last assess at 1715. She have schedule Ativan 1 mg three times daily. She was given hydroxyzine 25 mg prn at 1515 for anxiety. Zyprexa 2.5 mg tablet prn at 1436 for agitation.  She  take he medication in ice cream or apple sauce. If the pill is large crush and place in ice cream or apple sauce.The plan is still for placement.

## 2023-03-13 NOTE — Consult Note (Addendum)
Natalie Brown is a 57 y.o. AA female with a past psychiatric/medical history of early onset dementia, bipolar affective disorder, catatonia, vitamin B12 deficiency, CKD stage IIIa, hypertension, and severe protein calorie malnutrition, who is currently a border within the emergency department while placement at an alternative living facility is obtained, due to the patient reportedly upon transfer and discharge from [facility] to her new living arrangements, had an anxiety/stress reaction in route, which led to the patient being denied upon arrival and sent back to the emergency department for safety and continuation of care.    Patient prior to attempted discharged has appreciably been within the [name] health system's care from 12/24/2021- current.  Patient was originally admitted medically on 12/24/2021 due to possible catatonia/altered mental status from the behavioral health hospital (later diagnosed with early onset dementia).  TTS has been reconsulted at this time for medication adjustments, due to increased incidence of behavioral disturbances, in the context of the patient's early onset dementia and primary psychiatric condition of bipolar affective disorder.  After long discussion with the behavioral health psychiatry team, extensive chart review, and extensive shared decision making conversation with the patient's legal guardian i.e. Natalie Brown, recommendations today are to increase the patient's lorazepam from 0.5 mg 3 times daily back to 1 mg 3 times daily, I.e., the original dose that the patient was stabilized on, just prior to transfer to assisted living facility.  Patient's legal guardian verbalized agreement with medication changes.  Of note, patient is extremely sensitive to high potency antipsychotics, as well as largely antipsychotics in general, and additionally has had difficulty with standard dementia behavioral disturbances pharmacological interventions such as Depakote (I.e., has  appreciably had elevated ammonia levels), thus will cautiously begin with previous pharmacological adjustment mentioned above to start.   Recommendations  Spoke extensively with Dr. Viviano Simas and Dr. Lucianne Muss who agree with medication adjustments.  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 3 times daily as needed for anxiety -Recommend continue olanzapine 2.5 mg as needed every 6 hours for agitation -Recommend continue safety precautions -Recommend increase Ativan from 0.5 mg 3 times daily to 1 mg 3 times daily -Recommend continue olanzapine as scheduled  Delirium Interventions for Nursing and Staff:  - RN to open blinds every AM. - To Bedside: Glasses, hearing aide, and pt's own shoes. Make available to patients. when possible and encourage use. - Encourage po fluids when appropriate, keep fluids within reach. - OOB to chair with meals. - Passive ROM exercises to all extremities with AM & PM care. - RN to assess orientation to person, time and place QAM and PRN. - Recommend extended visitation hours with familiar family/friends as feasible. - Staff to minimize disturbances at night. Turn off television when pt asleep or when not in use.  Patient continues to board in the emergency department while placement is being found for her.  TTS was consulted for medication adjustment evaluation and not for disposition reasons.  Will continue to follow the patient at this time for medication adjustments due to increased recent behavioral disturbances.

## 2023-03-14 DIAGNOSIS — R410 Disorientation, unspecified: Secondary | ICD-10-CM

## 2023-03-14 LAB — CBC
HCT: 43.8 % (ref 36.0–46.0)
Hemoglobin: 14.4 g/dL (ref 12.0–15.0)
MCH: 29.3 pg (ref 26.0–34.0)
MCHC: 32.9 g/dL (ref 30.0–36.0)
MCV: 89.2 fL (ref 80.0–100.0)
Platelets: 270 10*3/uL (ref 150–400)
RBC: 4.91 MIL/uL (ref 3.87–5.11)
RDW: 12.9 % (ref 11.5–15.5)
WBC: 7.8 10*3/uL (ref 4.0–10.5)
nRBC: 0 % (ref 0.0–0.2)

## 2023-03-14 LAB — COMPREHENSIVE METABOLIC PANEL
ALT: 57 U/L — ABNORMAL HIGH (ref 0–44)
AST: 43 U/L — ABNORMAL HIGH (ref 15–41)
Albumin: 3.2 g/dL — ABNORMAL LOW (ref 3.5–5.0)
Alkaline Phosphatase: 56 U/L (ref 38–126)
Anion gap: 9 (ref 5–15)
BUN: 39 mg/dL — ABNORMAL HIGH (ref 6–20)
CO2: 28 mmol/L (ref 22–32)
Calcium: 9 mg/dL (ref 8.9–10.3)
Chloride: 115 mmol/L — ABNORMAL HIGH (ref 98–111)
Creatinine, Ser: 1.42 mg/dL — ABNORMAL HIGH (ref 0.44–1.00)
GFR, Estimated: 44 mL/min — ABNORMAL LOW (ref >60)
Glucose, Bld: 105 mg/dL — ABNORMAL HIGH (ref 70–99)
Potassium: 4.1 mmol/L (ref 3.5–5.1)
Sodium: 152 mmol/L — ABNORMAL HIGH (ref 135–145)
Total Bilirubin: 0.4 mg/dL (ref <1.2)
Total Protein: 6.8 g/dL (ref 6.5–8.1)

## 2023-03-14 LAB — HIV ANTIBODY (ROUTINE TESTING W REFLEX): HIV Screen 4th Generation wRfx: NONREACTIVE

## 2023-03-14 MED ORDER — DEXTROSE-SODIUM CHLORIDE 5-0.45 % IV SOLN: INTRAVENOUS | Status: AC

## 2023-03-14 NOTE — Progress Notes (Signed)
Daily Progress Note   Patient Name: Natalie Brown       Date: 03/14/2023 DOB: May 19, 1965  Age: 57 y.o. MRN#: [id number] Attending Physician: Lorin Glass, MD Primary Care Physician: Lavinia Sharps, NP Admit Date: 01/22/2023  Reason for Consultation/Follow-up: Establishing goals of care, Non pain symptom management, and Psychosocial/spiritual support  Patient Profile/HPI:  57 y.o. female with past medical history of HTN, bipolar 1 disorder with mania, catatonia, early onset dementia presented to ED on 01/22/2023 from SNF due to SNF declining to accept her. This was after an over a year long hospitalization for catatonia- hospitalization was prolonged due to disposition. She has been in the ED since 10/2- now 48 days. Medically stable. Eating 40-100% of meals with assistance- requires feeding. Intermittently in restraints. Psych has seen and is adjusting medications. She has had prolonged ED boarding due to disposition issues. Palliative consulted for assistance with improving quality of life per Dr. Rosalia Hammers.   Subjective: Chart reviewed including labs, progress notes, imaging from this and previous encounters.  Evaluated patient. She was awake, constantly moving her whole body in bed in an almost spasmodic pattern but at the same time accepting bites of food being fed to her and eating appropriately. [name] nonverbal. Per nursing and nursing technician at bedside who are familiar with her from previous admission this is baseline for her.  She has not required restraints since being admitted to the floor. She has a Recruitment consultant due to her inclination to stand and wander.    Review of Systems  Unable to perform ROS: Dementia     Physical Exam Vitals and nursing note reviewed.  Constitutional:       General: [name] not in acute distress.    Appearance: [name] ill-appearing.  Neurological:     Mental Status: [name] alert.     Coordination: Coordination abnormal.     Comments: Continuous nonpurposeful spasmodic movements of her whole body             Vital Signs: BP (!) 92/52 (BP Location: Left Leg)   Pulse 82   Temp 98.5 F (36.9 C) (Oral)   Resp 18   Ht 5\' 6"  (1.676 m)   Wt 47 kg   SpO2 100%   BMI 16.72 kg/m  SpO2: SpO2: 100 % O2  Device: O2 Device: Room Air O2 Flow Rate:    Intake/output summary:  Intake/Output Summary (Last 24 hours) at 03/14/2023 1447 Last data filed at 03/14/2023 1200 Gross per 24 hour  Intake 818 ml  Output --  Net 818 ml   LBM: Last BM Date : 03/13/23 Baseline Weight: Weight: 46 kg Most recent weight: Weight: 47 kg       Palliative Assessment/Data: PPS: 30%      Patient Active Problem List   Diagnosis Date Noted   Failure to thrive in adult 03/13/2023   Hypoalbuminemia 03/13/2023   Elevated serum creatinine 03/13/2023   Palliative care by specialist 03/13/2023   Delirium 03/13/2023   Agitation 02/09/2023   Protein-calorie malnutrition, severe 11/16/2022   B12 deficiency 02/19/2022   COVID-19 virus infection 02/19/2022   Drooling 02/19/2022   Essential hypertension 02/19/2022   Altered mental state 12/26/2021   Sinus tachycardia 12/25/2021   Tobacco abuse 12/24/2021   Delirium due to multiple etiologies, acute, hyperactive 12/24/2021   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) 11/11/2021   Bipolar affective disorder, current episode manic with psychotic symptoms (HCC) 09/12/2021   Dental caries 06/29/2015   Cervical cancer screening 02/22/2014   Current smoker 02/22/2014   Urge incontinence of urine 12/30/2013   Need for prophylactic vaccination and inoculation against influenza 12/30/2013   Bipolar 1 disorder (HCC) 12/30/2013    Palliative Care Assessment & Plan     Assessment/Recommendations/Plan  Very difficult situation- discussed with care manager and TOC- appreciate their assistance- difficult disposition due to previous behaviors and low SSI income  Continue current plan of care Appreciate psychiatry for medication management- understand they are very familiar with patient   Code Status: DNR  Prognosis:  Unable to determine  Discharge Planning: To Be Determined  Care plan was discussed with care team.   Thank you for allowing the Palliative Medicine Team to assist in the care of this patient.  Total time:  35 minutes Prolonged billing:  Time includes:   Preparing to see the patient (e.g., review of tests) Obtaining and/or reviewing separately obtained history Performing a medically necessary appropriate examination and/or evaluation Counseling and educating the patient/family/caregiver Ordering medications, tests, or procedures Referring and communicating with other health care professionals (when not reported separately) Documenting clinical information in the electronic or other health record Independently interpreting results (not reported separately) and communicating results to the patient/family/caregiver Care coordination (not reported separately) Clinical documentation  Ocie Bob, AGNP-C Palliative Medicine   Please contact Palliative Medicine Team phone at [phone] for questions and concerns.

## 2023-03-14 NOTE — Progress Notes (Signed)
Patient arrived to the unit from ED via hospital bed accompanied by ED RN and a sitter. Patient is alert to self, non-verbal and is unable to follow commands. Full assessment completed, patient was settled in her room, safety sitter is at bedside. Will continue to monitor.

## 2023-03-14 NOTE — Progress Notes (Signed)
Initial Nutrition Assessment  DOCUMENTATION CODES:   Severe malnutrition in context of chronic illness, Underweight  INTERVENTION:  Continue with current diet Ensure Plus High Protein po BID, each supplement provides 350 kcal and 20 grams of protein. Prosource BID MVM Magic cup TID with meals, each supplement provides 290 kcal and 9 grams of protein   NUTRITION DIAGNOSIS:   Severe Malnutrition related to chronic illness as evidenced by severe fat depletion, severe muscle depletion, energy intake < or equal to 50% for > or equal to 5 days.    GOAL:   Patient will meet greater than or equal to 90% of their needs    MONITOR:   PO intake, Supplement acceptance, Weight trends  REASON FOR ASSESSMENT:   Consult Assessment of nutrition requirement/status  ASSESSMENT:  56 y.o. F, that has been with [facility] since 12-24-21. Has been in ED or 49 days. Was admitted to [facility] 03/13/2023 for early onset dementia with behavioral disturbance. PMH; HTN, bipolar 1 disorder with mania, catatonia, dementia. Pt has been in ED since 10/2, she is medically stable.  Consumes 40-100% of her meals with feeding assistance required.  Palliative following for improving quality of life.  Sitter in room patient non verbal and slept through NFPE. Sitter states that appetite is hit or miss some days are good and others are not. [name] familiar with pt and has seen her off and on through out her hospital course.  She reports weight visual weight loss. Stated that it takes three people to hold when she gets agitated.  Patient pending placement.   Admit weight: 46 kg Current weight: 47 kg  Weight history: 03/14/23 47 kg  01/21/23 46 kg  12/24/21 72 kg      Average Meal Intake: 40-100: 96% intake x 6 recorded meals  Nutritionally Relevant Medications: Scheduled Meds:  (feeding supplement) PROSource Plus  30 mL Oral BID BM   feeding supplement  237 mL Oral BID BM   LORazepam  1 mg Oral TID    multivitamin with minerals  1 tablet Oral Daily   OLANZapine  2.5 mg Oral QHS    Continuous Infusions:  dextrose 5 % and 0.45 % NaCl     PRN Meds:.acetaminophen **OR** acetaminophen, hydrOXYzine, lactulose, OLANZapine  Labs Reviewed:  Na:152H (Dr addressing)     NUTRITION - FOCUSED PHYSICAL EXAM:  Flowsheet Row Most Recent Value  Orbital Region Severe depletion  Upper Arm Region Severe depletion  Thoracic and Lumbar Region Unable to assess  Buccal Region Moderate depletion  Temple Region Severe depletion  Clavicle Bone Region Severe depletion  Clavicle and Acromion Bone Region Severe depletion  Scapular Bone Region Unable to assess  Dorsal Hand Unable to assess  Patellar Region Moderate depletion  Anterior Thigh Region Moderate depletion  Posterior Calf Region Moderate depletion  Edema (RD Assessment) Mild  Hair Reviewed  Eyes Unable to assess  Mouth Reviewed  Skin Reviewed  Nails Reviewed       Diet Order:   Diet Order             Diet regular Room service appropriate? Yes; Fluid consistency: Thin  Diet effective now                   EDUCATION NEEDS:   Not appropriate for education at this time  Skin:  Skin Assessment: Reviewed RN Assessment  Last BM:  03/13/23  Height:   Ht Readings from Last 1 Encounters:  01/22/23 5\' 6"  (1.676 m)  Weight:   Wt Readings from Last 1 Encounters:  03/14/23 47 kg    Ideal Body Weight:     BMI:  Body mass index is 16.72 kg/m.  Estimated Nutritional Needs:   Kcal:  1450-1755 kcal/d  Protein:  65-75 g/d  Fluid:  >/=    Jamelle Haring RDN, LDN Clinical Dietitian  RDN pager # available on Amion

## 2023-03-14 NOTE — Progress Notes (Signed)
PROGRESS NOTE  Natalie Brown  DOB: Jul 21, 1965  PCP: Lavinia Sharps, NP YQM:[id number]  DOA: 01/22/2023  LOS: 1 day  Hospital Day: 39  Brief narrative: Natalie Brown is a 57 y.o. female with PMH significant for early onset dementia, bipolar affective disorder, catatonia, hypertension, sinus tachycardia, vitamin B12 deficiency, CKD, severe protein calorie malnutrition. Patient was in the hospital for more than 400 days and was discharged to a facility on 10/2 only to be brought back to the ED right away for agitated behavior. Since then, patient remained in the ED for 49 days waiting for a safe disposition plan which could not succeed. While in the ED, she was seen by psychiatry service for worse mental status. 11/17, family is concerned about Zyprexa and Haldol 11/18, psychiatry recommended to discontinue Haldol and continued on Zyprexa, Ativan, hydroxyzine Placement attempts failed 11/21, patient was seen by palliative care.  After discussion with family, patient was made DNR/DNI comfort measures. She was admitted to [facility] for failure to thrive and also to focus on interventions on improving quality of life as she continued to deteriorate in the ED.  Subjective: Patient was seen and examined this morning. At the time of my evaluation, she was rocking in the bed involuntarily moving her hands, thrashing around but still cooperating open her mouth to eat breakfast fed by nursing staff. Unable to have any conversation.  Assessment and plan: Early onset dementia with behavioral disturbance Bipolar 1 disorder Delirium/Agitation Seen by psychiatry service.  Haldol was stopped on 11/18.   Recommended to continue Zyprexa, Ativan, hydroxyzine with continued safety observation. Continue supportive care   Sinus tachycardia Known history of this.  Heart rate in 80s and 90s today.   Weight loss Nutritional deficiencies Continue with diet, supplemental nutrition, vitamins  Goals of  care   Code Status: Limited: Do not attempt resuscitation (DNR) -DNR-LIMITED -Do Not Intubate/DNI      DVT prophylaxis:  enoxaparin (LOVENOX) injection 30 mg Start: 03/13/23 2200   Antimicrobials: None Fluid: D5 half NS Consultants: None Family Communication: None at bedside  Status: Inpatient Level of care:  Med-Surg   Needs to continue in-hospital care: No safe disposition plan      Diet:  Diet Order             Diet regular Room service appropriate? Yes; Fluid consistency: Thin  Diet effective now                   Scheduled Meds:  (feeding supplement) PROSource Plus  30 mL Oral BID BM   acetaminophen  1,000 mg Oral TID   enoxaparin (LOVENOX) injection  30 mg Subcutaneous Q24H   feeding supplement  237 mL Oral BID BM   LORazepam  1 mg Oral TID   multivitamin with minerals  1 tablet Oral Daily   OLANZapine  2.5 mg Oral QHS   sodium chloride flush  3 mL Intravenous Q12H    PRN meds: acetaminophen **OR** acetaminophen, hydrOXYzine, lactulose, OLANZapine   Infusions:   dextrose 5 % and 0.45 % NaCl      Antimicrobials: Anti-infectives (From admission, onward)    None       Objective: Vitals:   03/14/23 0511 03/14/23 0844  BP: 99/72 (!) 92/52  Pulse: 97 82  Resp: 18 18  Temp: 98.5 F (36.9 C)   SpO2: 100% 100%    Intake/Output Summary (Last 24 hours) at 03/14/2023 1427 Last data filed at 03/14/2023 1200 Gross per 24 hour  Intake 818 ml  Output --  Net 818 ml   Filed Weights   01/22/23 1951 03/12/23 1218 03/14/23 0622  Weight: 46 kg (S) 40.9 kg 47 kg   Weight change: 6.1 kg Body mass index is 16.72 kg/m.   Physical Exam: General exam: Middle-aged African-American female Skin: No rashes, lesions or ulcers. HEENT: Atraumatic, normocephalic, no obvious bleeding Lungs: Clear to auscultation bilaterally CVS: Regular rate and rhythm, no murmur GI/Abd soft, nontender, nondistended, bowel submitted CNS: Awake, unable to have meaningful  conversation.  Has persistent involuntary movement of all extremities Psychiatry: Sad affect Extremities: No pedal edema, no calf tenderness  Data Review: I have personally reviewed the laboratory data and studies available.  F/u labs  Unresulted Labs (From admission, onward)    None      Total time spent in review of labs and imaging, patient evaluation, formulation of plan, documentation and communication with family: 25 minutes  Signed, Lorin Glass, MD [facility] 03/14/2023

## 2023-03-15 MED ORDER — HALOPERIDOL LACTATE 5 MG/ML IJ SOLN
2.0000 mg | Freq: Four times a day (QID) | INTRAMUSCULAR | Status: DC | PRN
  Administered 2023-03-15 – 2023-03-17 (×2): 2 mg via INTRAMUSCULAR
  Filled 2023-03-15 (×2): qty 1

## 2023-03-15 MED ORDER — LORAZEPAM 2 MG/ML PO CONC
1.0000 mg | Freq: Three times a day (TID) | ORAL | Status: DC
  Administered 2023-03-15 – 2023-03-19 (×12): 1 mg via ORAL
  Filled 2023-03-15 (×12): qty 1

## 2023-03-15 MED ORDER — HALOPERIDOL LACTATE 5 MG/ML IJ SOLN: 2.0000 mg | Freq: Four times a day (QID) | INTRAMUSCULAR | Status: DC | PRN

## 2023-03-15 NOTE — Progress Notes (Signed)
PT  Note  Patient Details Name: Natalie Brown MRN: [id number] DOB: 1966-02-14   Reason Eval/Treat Not Completed: Other (comment)  Attempted to evaluate [name]; not following commands, unable to consistently redirect with multimodal cues. Pt awakens but is erratic with her movements, climbing over EOB, unable to sustain seated position, nor stand without max assist +2 for safety.   Prior PT notes indicate similar challenges, unable to participate with acute PT and recommended LTC without PT follow-up.  NT familiar with pt states pt more lethargic than baseline, no longer eating.  Noted palliative care on 11/21 updated for comfort measures.  No skilled acute PT needs indicated at this time. Signing-off.  Please re-order if pt status improves and demonstrates ability to participate in efforts to improve functional independence.  Kathlyn Sacramento, PT, DPT [facility]  Rehabilitation Services Physical Therapist Office: [phone] Website: [email or web address]   Berton Mount 03/15/2023, 10:55 AM

## 2023-03-15 NOTE — Progress Notes (Signed)
PROGRESS NOTE  [name]  DOB: May 02, 1965  PCP: Lavinia Sharps, NP ZOX:[id number]  DOA: 01/22/2023  LOS: 2 days  Hospital Day: 63  Brief narrative: Natalie Brown is a 57 y.o. female with PMH significant for early onset dementia, bipolar affective disorder, catatonia, hypertension, sinus tachycardia, vitamin B12 deficiency, CKD, severe protein calorie malnutrition. Patient was in the hospital for more than 400 days and was discharged to a facility on 10/2 only to be brought back to the ED right away for agitated behavior. Since then, patient remained in the ED for 49 days waiting for a safe disposition plan which could not succeed. While in the ED, she was seen by psychiatry service for worse mental status. 11/17, family is concerned about Zyprexa and Haldol 11/18, psychiatry recommended to discontinue Haldol and continued on Zyprexa, Ativan, hydroxyzine Placement attempts failed 11/21, patient was seen by palliative care.  After discussion with family, patient was made DNR/DNI comfort measures. She was admitted to [facility] for failure to thrive and also to focus on interventions on improving quality of life as she continued to deteriorate in the ED.  Subjective: Patient was seen and examined this morning. Continues to have involuntary movement.  Thrashing around.  Opens mouth to eat while being fed by nursing staff  Assessment and plan: Early onset dementia with behavioral disturbance Bipolar 1 disorder Delirium/Agitation Seen by psychiatry service.  Haldol was stopped on 11/18.   Recommended to continue Zyprexa, Ativan, hydroxyzine with continued safety observation. Continue supportive care This afternoon, patient is agitated requiring multiple nursing staff to calm her down.  I decided to resume Haldol 2 mg every 6 hours as needed.  She does not have IV access.  Will give IM Haldol.  Also switched from Ativan tab to syrup.   Sinus tachycardia Known history of this.  Heart  rate in 80s and 90s today.  Hypernatremia Unable to use IV hydration because of inability to secure IV line due to her agitation Encourage oral intake Recent Labs  Lab 03/11/23 0450 03/14/23 0619  NA 145 152*    Weight loss Nutritional deficiencies Continue with diet, supplemental nutrition, vitamins  Goals of care   Code Status: Limited: Do not attempt resuscitation (DNR) -DNR-LIMITED -Do Not Intubate/DNI      DVT prophylaxis:  enoxaparin (LOVENOX) injection 30 mg Start: 03/13/23 2200   Antimicrobials: None Fluid: D5 half NS Consultants: None Family Communication: None at bedside  Status: Inpatient Level of care:  Med-Surg   Needs to continue in-hospital care: No safe disposition plan    Diet:  Diet Order             Diet regular Room service appropriate? Yes; Fluid consistency: Thin  Diet effective now                   Scheduled Meds:  (feeding supplement) PROSource Plus  30 mL Oral BID BM   acetaminophen  1,000 mg Oral TID   enoxaparin (LOVENOX) injection  30 mg Subcutaneous Q24H   feeding supplement  237 mL Oral BID BM   LORazepam  1 mg Oral TID   multivitamin with minerals  1 tablet Oral Daily   OLANZapine  2.5 mg Oral QHS   sodium chloride flush  3 mL Intravenous Q12H    PRN meds: acetaminophen **OR** acetaminophen, haloperidol lactate, hydrOXYzine, lactulose, OLANZapine   Infusions:     Antimicrobials: Anti-infectives (From admission, onward)    None  Objective: Vitals:   03/14/23 1300 03/15/23 0811  BP: 113/68 (!) 132/93  Pulse: 100 (!) 107  Resp: 18   Temp: 98.5 F (36.9 C)   SpO2: 99% 96%    Intake/Output Summary (Last 24 hours) at 03/15/2023 1449 Last data filed at 03/15/2023 1446 Gross per 24 hour  Intake 241 ml  Output --  Net 241 ml   Filed Weights   01/22/23 1951 03/12/23 1218 03/14/23 0622  Weight: 46 kg (S) 40.9 kg 47 kg   Weight change:  Body mass index is 16.72 kg/m.   Physical Exam: General  exam: Middle-aged African-American female Skin: No rashes, lesions or ulcers. HEENT: Atraumatic, normocephalic, no obvious bleeding Lungs: Clear to auscultation bilaterally CVS: Regular rate and rhythm, no murmur GI/Abd soft, nontender, nondistended, bowel submitted CNS: Awake, unable to have meaningful conversation.  Has persistent involuntary movement of all extremities Psychiatry: Restless, agitated Extremities: No pedal edema, no calf tenderness  Data Review: I have personally reviewed the laboratory data and studies available.  F/u labs  Unresulted Labs (From admission, onward)     Start     Ordered   03/16/23 0500  Basic metabolic panel  Tomorrow morning,   R       Question:  Specimen collection method  Answer:  Lab=Lab collect   03/15/23 0754           Total time spent in review of labs and imaging, patient evaluation, formulation of plan, documentation and communication with family: 45 minutes  Signed, Lorin Glass, MD [facility] 03/15/2023

## 2023-03-15 NOTE — Evaluation (Signed)
Occupational Therapy Evaluation Patient Details Name: Natalie Brown MRN: [id number] DOB: Oct 28, 1965 Today's Date: 03/15/2023   History of Present Illness Pt is a 57 y.o. female who presented back to [facility] 01/22/23 after the facility she discharged to earlier that day declined her as pt became combative and facility reported they were unable to provide care for her. Of note, was admitted 12/24/21-01/22/23 before discharging to that facility. Admitted to [facility] on 09/07/2021 for catatonia treatment, arrived from [facility] on 12/24/21 for tachycardia, covid + 01/12/22, Fall on 08/26/2022. 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   Clinical Impression   Pt admitted to hospital services due to problems listed above, as well as failure to thrive over the past 52 days in the ED. Therapies were consulted again due to change in status, however upon evaluation, pt continues to be unable to participate in therapy due to cognition. She requires max assist for all mobility and total assist to follow commands, however when left alone, she demonstrates improved mobility with no directions/commands. OT will be signing off at this time due to poor ability to participate in therapy.  Please re-consult if status improves and/or pt becomes able to participate at least minimally with therapies.       If plan is discharge home, recommend the following: Two people to help with walking and/or transfers;Two people to help with bathing/dressing/bathroom;Assistance with cooking/housework;Assistance with feeding;Direct supervision/assist for medications management;Direct supervision/assist for financial management    Functional Status Assessment  Patient has had a recent decline in their functional status and/or demonstrates limited ability to make significant improvements in function in a reasonable and predictable amount of time  Equipment Recommendations  None  recommended by OT    Recommendations for Other Services       Precautions / Restrictions Precautions Precautions: Fall Precaution Comments: very impulsive Restrictions Weight Bearing Restrictions: No      Mobility Bed Mobility Overal bed mobility: Needs Assistance Bed Mobility: Supine to Sit, Sit to Supine Rolling: Max assist   Supine to sit: Max assist Sit to supine: Max assist   General bed mobility comments: +2 for safety    Transfers Overall transfer level: Needs assistance Equipment used: 2 person hand held assist Transfers: Sit to/from Stand Sit to Stand: Max assist, +2 physical assistance, +2 safety/equipment           General transfer comment: Pt limited by cognition and unable to follow commands. Impulsively can stand and jump into bed, however will not do so with direction.      Balance Overall balance assessment: Needs assistance                                         ADL either performed or assessed with clinical judgement   ADL Overall ADL's : Needs assistance/impaired                                       General ADL Comments: Has the potential movement to do ADLs but is limited by cognition     Vision Baseline Vision/History: 0 No visual deficits Ability to See in Adequate Light: 0 Adequate Patient Visual Report: No change from baseline Vision Assessment?: No apparent visual deficits     Perception  Praxis         Pertinent Vitals/Pain Pain Assessment Pain Assessment: PAINAD Faces Pain Scale: No hurt Breathing: normal Negative Vocalization: none Facial Expression: smiling or inexpressive Body Language: tense, distressed pacing, fidgeting Consolability: no need to console PAINAD Score: 1     Extremity/Trunk Assessment Upper Extremity Assessment Upper Extremity Assessment: Difficult to assess due to impaired cognition   Lower Extremity Assessment Lower Extremity Assessment:  Difficult to assess due to impaired cognition   Cervical / Trunk Assessment Cervical / Trunk Assessment: Normal   Communication Communication Communication: Difficulty communicating thoughts/reduced clarity of speech;Difficulty following commands/understanding   Cognition Arousal: Lethargic Behavior During Therapy: Restless, Impulsive Overall Cognitive Status: History of cognitive impairments - at baseline                                 General Comments: non-verbal,  not following any commands     General Comments  VSS on RA, no swelling noted.    Exercises     Shoulder Instructions      Home Living Family/patient expects to be discharged to:: Unsure                                 Additional Comments: Pt may benefit from palliative care or long term inpatient behavioral health.      Prior Functioning/Environment Prior Level of Function : Needs assist             Mobility Comments: Was able to ambulate with intermittent HHA per prior ~1 year admission; she was recently hosptialized here and per RN she was up and taking her self to the bathroom, eating, and do her ADLs all on her own ADLs Comments: Pt reliant on staff for medication management and intermittent assistance with ADLs during ~1 year long admission recently        OT Problem List: Impaired balance (sitting and/or standing);Decreased cognition;Decreased safety awareness      OT Treatment/Interventions:      OT Goals(Current goals can be found in the care plan section) Acute Rehab OT Goals Patient Stated Goal: None stated OT Goal Formulation: Patient unable to participate in goal setting Time For Goal Achievement: 03/15/23 Potential to Achieve Goals: Poor  OT Frequency:      Co-evaluation PT/OT/SLP Co-Evaluation/Treatment: Yes Reason for Co-Treatment: Complexity of the patient's impairments (multi-system involvement);Necessary to address cognition/behavior during  functional activity;To address functional/ADL transfers PT goals addressed during session: Mobility/safety with mobility OT goals addressed during session: ADL's and self-care      AM-PAC OT "6 Clicks" Daily Activity     Outcome Measure Help from another person eating meals?: Total Help from another person taking care of personal grooming?: Total Help from another person toileting, which includes using toliet, bedpan, or urinal?: Total Help from another person bathing (including washing, rinsing, drying)?: Total Help from another person to put on and taking off regular upper body clothing?: Total Help from another person to put on and taking off regular lower body clothing?: Total 6 Click Score: 6   End of Session Nurse Communication: Other (comment) (Unable to follow commands.)  Activity Tolerance: Other (comment) (unable to participate with therapy.) Patient left: in bed;with bed alarm set;with nursing/sitter in room  OT Visit Diagnosis: Other symptoms and signs involving cognitive function;Cognitive communication deficit (R41.841);Other abnormalities of gait and mobility (R26.89)  Time: 2841-3244 OT Time Calculation (min): 8 min Charges:  OT General Charges $OT Visit: 1 Visit OT Evaluation $OT Eval Moderate Complexity: 1 Mod  Masako Overall Bing Plume, OTR/L [facility]  Lissete Maestas Elane Bing Plume 03/15/2023, 1:55 PM

## 2023-03-16 DIAGNOSIS — Z515 Encounter for palliative care: Secondary | ICD-10-CM

## 2023-03-16 DIAGNOSIS — Z7189 Other specified counseling: Secondary | ICD-10-CM

## 2023-03-16 LAB — BASIC METABOLIC PANEL
Anion gap: 11 (ref 5–15)
BUN: 33 mg/dL — ABNORMAL HIGH (ref 6–20)
CO2: 26 mmol/L (ref 22–32)
Calcium: 9.2 mg/dL (ref 8.9–10.3)
Chloride: 117 mmol/L — ABNORMAL HIGH (ref 98–111)
Creatinine, Ser: 1.63 mg/dL — ABNORMAL HIGH (ref 0.44–1.00)
GFR, Estimated: 37 mL/min — ABNORMAL LOW (ref >60)
Glucose, Bld: 81 mg/dL (ref 70–99)
Potassium: 4.1 mmol/L (ref 3.5–5.1)
Sodium: 154 mmol/L — ABNORMAL HIGH (ref 135–145)

## 2023-03-16 NOTE — Progress Notes (Signed)
Palliative Medicine Inpatient Follow Up Note HPI: Natalie Brown is a 57 y.o. female with PMH significant for early onset dementia, bipolar affective disorder, catatonia, hypertension, sinus tachycardia, vitamin B12 deficiency, CKD, severe protein calorie malnutrition. Palliative care is involved to support additional goals of care conversations.   Today's Discussion 03/16/2023  *Please note that this is a verbal dictation therefore any spelling or grammatical errors are due to the "Dragon Medical One" system interpretation.  Chart reviewed inclusive of vital signs, progress notes, laboratory results, and diagnostic images. Na increasing.   I met with Natalie Brown at bedside this afternoon. Per her RN, Natalie Brown she has had episodes of being awake cloaked with agitation. When she is asleep she neglects to eat and drink.   I called and spoke to patients mother, Natalie Brown. She and I discussed Natalie Brown's present situation. We reviewed how "awful" things have been since Natalie Brown has been hospitalized. We discussed her ongoing mental deterioration. Patients mother blames much of her present state on a prolonged stay in the ED in a room without windows. She feels the social isolation caused an far worse exacerbation of her already significant psychiatric syndrome.   Natalie Brown shares that from her understanding, Natalie Brown has a significant dementia which is likely causing deterioration. She shares that she has been searching for a safe placement though none have come to fruition. Furthermore she feels that her prior hospitalization and discharge led to the present scenario she is in.  Natalie Brown feels that Palliative care tends to push towards end of life care. I provided insights on what Palliative care does and does not do. I shared that often it is true that the team is asked to support additional conversations as patients are transitioning to end of life. Though I also explained that we can be present along-side patients  who are undergoing curative therapies.   I shared my concerns in the setting of Natalie Brown specifically - if she has a deteriorating disease then what her prognosis is expected to be. Natalie Brown seems at odds with her daughters situation she does not recount speaking to the neurology team for further clarity on what expectations are from that perspective. She expresses interest in speaking to the neurology team.   We discussed whether or not Natalie Brown would like Natalie Brown to get an IV and mIVF. She expresses that she does and was under the impression that she had already agreed to this. She asked if we are trying to "get rid of" her daughter. I shared by no means is this the hope, we want to make sure whatever measures we are pursuing align with patients goals.   I provided insights on patients who have advanced end stage dementia. I shared that it is not uncommon for them to desire sleep over eating/drinking. We reviewed that this desire to eat/drink tends to diminish as the disease worsens. I shared openly and honestly that this often indicates the end of the disease. We reviewed that artificial measures may stave the inevitable from occurring for a period of time though eventually another ailment will afflict the patient such as a UTI, pneumonia, or ulcer.   Natalie Brown and I agreed to meet tomorrow for further conversations. In the meanwhile she is hopeful to hear from neurology.  Natalie Brown openly admits that she feels the healthcare team has thrown in the towel on her daughters care. She shares prior to discharge Natalie Brown was eating chikafillet and dancing when her sister visited. She now notes that her daughter remains in  a catatonic state.  Questions and concerns addressed/Palliative Support Provided.   Objective Assessment: Vital Signs Vitals:   03/15/23 2328 03/16/23 0516  BP: (!) 90/58 110/82  Pulse: 79 (!) 107  Resp: 17 17  Temp: (!) 97.3 F (36.3 C)   SpO2: 100% 93%   No intake or output data in the 24  hours ending 03/16/23 1552 Last Weight  Most recent update: 03/16/2023  5:31 AM    Weight  43.5 kg (95 lb 14.4 oz)            Gen: Middle aged AA F in NAD HEENT: Dry mucous membranes CV: Regular rate and rhythm  PULM:  On RA, breathing is even and nonlabored ABD: soft/nontender  EXT: muscle wasting Neuro: Sleeping  SUMMARY OF RECOMMENDATIONS   DNAR/DNI  Patients mother would like mIVF initiated  Appreciate Neurology input regarding what should be expected from a prognosis perspective with patients dementia to better guide patients mothers decisions  Education provided on end stage dementia symptoms  Ongoing PMT support  Time Spent: 52 Billing based on MDM: High ______________________________________________________________________________________ Natalie Brown [place] Palliative Medicine Team Team Cell Phone: [phone] Please utilize secure chat with additional questions, if there is no response within 30 minutes please call the above phone number  Palliative Medicine Team providers are available by phone from 7am to 7pm daily and can be reached through the team cell phone.  Should this patient require assistance outside of these hours, please call the patient's attending physician.

## 2023-03-16 NOTE — Progress Notes (Signed)
PROGRESS NOTE  Natalie Brown  DOB: 09-02-1965  PCP: Lavinia Sharps, NP ZOX:[id number]  DOA: 01/22/2023  LOS: 3 days  Hospital Day: 54  Brief narrative: Natalie Brown is a 57 y.o. female with PMH significant for early onset dementia, bipolar affective disorder, catatonia, hypertension, sinus tachycardia, vitamin B12 deficiency, CKD, severe protein calorie malnutrition. Patient was in the hospital for more than 400 days and was discharged to a facility on 10/2 only to be brought back to the ED right away for agitated behavior. Since then, patient remained in the ED for 49 days waiting for a safe disposition plan which could not succeed. While in the ED, she was seen by psychiatry service for worse mental status. 11/17, family is concerned about Zyprexa and Haldol 11/18, psychiatry recommended to discontinue Haldol and continued on Zyprexa, Ativan, hydroxyzine Placement attempts failed 11/21, patient was seen by palliative care.  After discussion with family, patient was made DNR/DNI comfort measures. She was admitted to [facility] for failure to thrive and also to focus on interventions on improving quality of life as she continued to deteriorate in the ED.  Subjective: Patient was seen and examined this morning. Sleeping.  Opens eyes on touch.  Not restless or agitated this morning. Yesterday evening, patient was agitated and required Haldol IV as needed. Labs from this morning with sodium level trending up.  Assessment and plan: Early onset dementia with behavioral disturbance Bipolar 1 disorder Delirium/Agitation Seen by psychiatry service.   Recommended to continue Zyprexa, Ativan, hydroxyzine with continued safety observation.  Haldol was held at the time. 11/23, patient was agitated requiring multiple nursing staff to calm her down.  I decided to resume Haldol 2 mg every 6 hours as needed.  She does not have IV access.  So Haldol was given by IM route.  Also switched from Ativan  tab to syrup.   Sinus tachycardia Known history of this.  Heart rate in low 100s.  Hypernatremia Unable to use IV hydration because of inability to secure IV line.  [name] agitated and would not allow IV access and even if it is successfully placed, she is at high risk of ripping it out which she has done in the past Encourage oral intake Recent Labs  Lab 03/11/23 0450 03/14/23 0619 03/16/23 0525  NA 145 152* 154*    Weight loss Nutritional deficiencies Continue with diet, supplemental nutrition, vitamins  Goals of care   Code Status: Limited: Do not attempt resuscitation (DNR) -DNR-LIMITED -Do Not Intubate/DNI      DVT prophylaxis:  enoxaparin (LOVENOX) injection 30 mg Start: 03/13/23 2200   Antimicrobials: None Fluid: No IV access Consultants: None Family Communication: None at bedside  Status: Inpatient Level of care:  Med-Surg   Needs to continue in-hospital care: No safe disposition plan    Diet:  Diet Order             Diet regular Room service appropriate? Yes; Fluid consistency: Thin  Diet effective now                   Scheduled Meds:  (feeding supplement) PROSource Plus  30 mL Oral BID BM   acetaminophen  1,000 mg Oral TID   enoxaparin (LOVENOX) injection  30 mg Subcutaneous Q24H   feeding supplement  237 mL Oral BID BM   LORazepam  1 mg Oral TID   multivitamin with minerals  1 tablet Oral Daily   OLANZapine  2.5 mg Oral QHS  sodium chloride flush  3 mL Intravenous Q12H    PRN meds: acetaminophen **OR** acetaminophen, haloperidol lactate, hydrOXYzine, lactulose, OLANZapine   Infusions:     Antimicrobials: Anti-infectives (From admission, onward)    None       Objective: Vitals:   03/15/23 2328 03/16/23 0516  BP: (!) 90/58 110/82  Pulse: 79 (!) 107  Resp: 17 17  Temp: (!) 97.3 F (36.3 C)   SpO2: 100% 93%    Intake/Output Summary (Last 24 hours) at 03/16/2023 1127 Last data filed at 03/15/2023 1446 Gross per 24  hour  Intake 1 ml  Output --  Net 1 ml   Filed Weights   03/12/23 1218 03/14/23 0622 03/16/23 0516  Weight: (S) 40.9 kg 47 kg 43.5 kg   Weight change:  Body mass index is 15.48 kg/m.   Physical Exam: General exam: Middle-aged African-American female Skin: No rashes, lesions or ulcers. HEENT: Atraumatic, normocephalic, no obvious bleeding Lungs: Clear to auscultation bilaterally CVS: Regular rate and rhythm, no murmur GI/Abd soft, nontender, nondistended, bowel submitted CNS: Somnolent.  Opens eyes to touch Psychiatry: Restless, agitated Extremities: No pedal edema, no calf tenderness  Data Review: I have personally reviewed the laboratory data and studies available.  F/u labs  Unresulted Labs (From admission, onward)    None      Total time spent in review of labs and imaging, patient evaluation, formulation of plan, documentation and communication with family: 25 minutes  Signed, Natalie Glass, MD [facility] 03/16/2023

## 2023-03-17 NOTE — Consult Note (Signed)
NEUROLOGY CONSULT NOTE   Date of service: March 17, 2023 Patient Name: Natalie Brown MRN:  [id number] DOB:  [birth year] Chief Complaint: "Dementia with behavioral disturbance" Requesting Provider: Lorin Glass, MD  History of Present Illness  Natalie Brown is a 57 y.o. female  PMH significant for early onset dementia, bipolar affective disorder, catatonia, hypertension, sinus tachycardia, vitamin B12 deficiency, CKD, severe protein calorie malnutrition. Patient was recently hospitalized for more than 400 days and was discharged to a facility on 10/2.  During her initial hospitalization neurology did see her for catatonia and had concerns for progression of her dementia versus metabolic encephalopathy versus polypharmacy.  However she was brought back to the ED for agitation and has remained in the hospital waiting for a safe disposition plan.  Psychiatry has been consulted for polypharmacy. [name] is currently on Zyprexa 2.5mg  at bedtime, Ativan 1 mg TID, and hydroxyzine 25 mg TID with Zyprexa 2.5 mg as needed every 6 hours for agitation.  She has had approximately 40 pound weight loss during her hospitalizations which is why the hydroxyzine was lowered.  She was also exhibiting EPS which is why the Haldol was discontinued per psych recommendations. Neurology was consulted for prognostication, at the request of the family.   ROS   Unable to ascertain due to altered mental status  Past History   Past Medical History:  Diagnosis Date   Bipolar 1 disorder with moderate mania (HCC) 12/30/2013   Hypertension     History reviewed. No pertinent surgical history.  Family History: History reviewed. No pertinent family history.  Social History  reports that she has been smoking. She uses smokeless tobacco. She reports that she does not drink alcohol and does not use drugs.  Allergies  Allergen Reactions   Penicillins Itching    Medications   Current Facility-Administered  Medications:    (feeding supplement) PROSource Plus liquid 30 mL, 30 mL, Oral, BID BM, Synetta Fail, MD, 30 mL at 03/17/23 2725   acetaminophen (TYLENOL) tablet 650 mg, 650 mg, Oral, Q6H PRN **OR** acetaminophen (TYLENOL) suppository 650 mg, 650 mg, Rectal, Q6H PRN, Synetta Fail, MD   acetaminophen (TYLENOL) tablet 1,000 mg, 1,000 mg, Oral, TID, Synetta Fail, MD, 1,000 mg at 03/17/23 0828   enoxaparin (LOVENOX) injection 30 mg, 30 mg, Subcutaneous, Q24H, Synetta Fail, MD, 30 mg at 03/16/23 2251   feeding supplement (ENSURE ENLIVE / ENSURE PLUS) liquid 237 mL, 237 mL, Oral, BID BM, Synetta Fail, MD, 237 mL at 03/17/23 3664   haloperidol lactate (HALDOL) injection 2 mg, 2 mg, Intramuscular, Q6H PRN, Dahal, Binaya, MD, 2 mg at 03/17/23 0113   hydrOXYzine (ATARAX) tablet 25 mg, 25 mg, Oral, TID PRN, Synetta Fail, MD, 25 mg at 03/17/23 0114   lactulose (CHRONULAC) 10 GM/15ML solution 20 g, 20 g, Oral, BID PRN, Synetta Fail, MD   LORazepam (ATIVAN) 2 MG/ML concentrated solution 1 mg, 1 mg, Oral, TID, Dahal, Binaya, MD, 1 mg at 03/17/23 4034   multivitamin with minerals tablet 1 tablet, 1 tablet, Oral, Daily, Synetta Fail, MD, 1 tablet at 03/17/23 0828   OLANZapine (ZYPREXA) tablet 2.5 mg, 2.5 mg, Oral, QHS, Synetta Fail, MD, 2.5 mg at 03/16/23 2251   OLANZapine (ZYPREXA) tablet 2.5 mg, 2.5 mg, Oral, Q6H PRN, Synetta Fail, MD, 2.5 mg at 03/17/23 0446   sodium chloride flush (NS) 0.9 % injection 3 mL, 3 mL, Intravenous, Q12H, Synetta Fail, MD, 3 mL at  03/13/23 2244  Vitals   Vitals:   03/15/23 0811 03/15/23 2328 03/16/23 0516 03/17/23 0758  BP: (!) 132/93 (!) 90/58 110/82 (!) 107/96  Pulse: (!) 107 79 (!) 107 79  Resp:  17 17 20   Temp:  (!) 97.3 F (36.3 C)    TempSrc:  Oral    SpO2: 96% 100% 93% (!) 78%  Weight:   43.5 kg   Height:        Body mass index is 15.48 kg/m.  Physical Exam   Constitutional: Thin middle  aged [place] American female Psych: Restless, quick to fall asleep  Eyes: No scleral injection.  HENT: No OP obstruction.  Head: Normocephalic.  Cardiovascular: Normal rate and regular rhythm.  Respiratory: Effort normal, non-labored breathing.  GI: Soft.  No distension. There is no tenderness.  Skin: WDI.   Neurologic Examination   Mental Status: Opens eyes, does not make eye contact or track examiner, does not follow commands  No verbal output noted during exam Cranial Nerves: II: PERRL, does not participate in VF testing. Blinks to threat III,IV, VI: EOMI without ptosis or diploplia.  V: Facial sensation is symmetric to temperature VII: Facial movement is symmetric resting and smiling VIII: Does not respond to verbal stimuli X: Palate elevates symmetrically XI: Shoulder shrug is symmetric. XII: Tongue protrudes midline without atrophy or fasciculations.  Motor: Paratonia noted. Bulk is poor. Moves all extremities antigravity  Sensory: Localizes to painful stimuli  Cerebellar: Unable to participate in FNF or HKS, no tremor noted with movement    Labs/Imaging/Neurodiagnostic studies   CBC:  Recent Labs  Lab 17-Mar-2023 0450 03/14/23 0619  WBC 6.7 7.8  HGB 14.6 14.4  HCT 45.8 43.8  MCV 92.0 89.2  PLT 285 270    Basic Metabolic Panel:  Lab Results  Component Value Date   NA 154 (H) 03/16/2023   K 4.1 03/16/2023   CO2 26 03/16/2023   GLUCOSE 81 03/16/2023   BUN 33 (H) 03/16/2023   CREATININE 1.63 (H) 03/16/2023   CALCIUM 9.2 03/16/2023   GFRNONAA 37 (L) 03/16/2023   GFRAA 65 09/19/2017    Lipid Panel:  Lab Results  Component Value Date   LDLCALC 101 (H) 09/08/2021    HgbA1c:  Lab Results  Component Value Date   HGBA1C 5.3 09/08/2021    Urine Drug Screen:     Component Value Date/Time   LABOPIA NONE DETECTED 09/05/2021 1618   COCAINSCRNUR NONE DETECTED 09/05/2021 1618   LABBENZ NONE DETECTED 09/05/2021 1618   AMPHETMU NONE DETECTED 09/05/2021  1618   THCU NONE DETECTED 09/05/2021 1618   LABBARB NONE DETECTED 09/05/2021 1618     Alcohol Level     Component Value Date/Time   ETH <10 09/05/2021 1327   CT Head without contrast(Personally reviewed): 1. No acute intracranial abnormality. 2. Age advanced cerebral atrophy.   ASSESSMENT   Natalie Brown is a 57 y.o. female  has a past medical history of Bipolar 1 disorder with moderate mania (HCC) (12/30/2013) and Hypertension. dementia, bipolar affective disorder, catatonia, sinus tachycardia, vitamin B12 deficiency, CKD, severe protein calorie malnutrition. Patient was recently hospitalized for more than 400 days and was discharged to a facility on 10/2.  She was brought back to the ED for agitation and has remained in the hospital waiting for a safe disposition plan.  Psychiatry has been consulted for worsening mental status and medication adjustments.  Neurology was consulted for prognostication.  She has been noted to have dementia that is  progressive, has behavioral disturbance with the along with psychiatric diagnoses of bipolar affective disorder and catatonia. I think her current presentation is likely related to progression of her underlying neurodegenerative process that has manifested itself as early onset dementia and probably the other psychiatric disorders as well. Without a proper neuropsych evaluation, it is hard to ascertain the type of dementia and there is no straight answer to what the prognostication might be but the fact that over the past over 1 to 2 years she has only deteriorated and not showing any signs of improvement makes it less likely that she will return to any functional ability that is neurologically meaningful.  RECOMMENDATIONS  At this time, management of behavioral symptoms per psychiatry and supportive care per primary team as well as ongoing palliative conversations seem appropriate. There is not much I have in terms of offering inpatient other than  checking for reversible causes of dementia and behavioral issues such as B12 levels, thiamine levels, RPR and A1c and replenishing anything that is reversible, but the prior tests have been in the normal range and I do not suspect them to be concerning.. This plan was discussed with Dr. Pola Corn ______________________________________________________________________    Patient seen and examined by NP/APP with MD. MD to update note as needed.   Elmer Picker, DNP, FNP-BC [facility] Pager: [phone]  Attending Neurohospitalist Addendum Patient seen and examined with APP/Resident. Agree with the history and physical as documented above. Agree with the plan as documented, which I helped formulate. I have independently reviewed the chart, obtained history, review of systems and examined the patient.I have personally reviewed pertinent head/neck/spine imaging (CT/MRI). Please feel free to call with any questions.  -- Milon Dikes, MD Neurologist [facility] Pager: [phone]

## 2023-03-17 NOTE — Progress Notes (Signed)
   Palliative Medicine Inpatient Follow Up Note HPI: Natalie Brown is a 57 y.o. female with PMH significant for early onset dementia, bipolar affective disorder, catatonia, hypertension, sinus tachycardia, vitamin B12 deficiency, CKD, severe protein calorie malnutrition. Palliative care is involved to support additional goals of care conversations.   Today's Discussion 03/17/2023  *Please note that this is a verbal dictation therefore any spelling or grammatical errors are due to the "Dragon Medical One" system interpretation.  Chart reviewed inclusive of vital signs, progress notes, laboratory results, and diagnostic images.  I met with Natalie Brown at bedside this afternoon in the presence of her RN, Psychologist, sport and exercise, and Licensed conveyancer. We discussed patients present health. Nursing shares that she was on 2W for over a year and during that time she would frequently walk around the unit and had a robust appetite. Since being readmitted October second staff note that she is quite declined now as compared to then. They share a willingness to try to help reorient her and offer nutritional support through frequently going to bedside to offer oral nutrition.  I called and spoke with patients mother, Natalie Brown. She shares a variety of grievances as they relate to her daughter. We reviewed that [name] is not thriving at this time and it is unclear as to how much progress she will or will not make moving forward. I shared that it's not clear the type of dementia her daughter has other than it being early onset. I shared my role is to offer her support.   Natalie Brown shares her own physical ailments in the setting of a new diagnosis of lung cancer. Allowed her time to express herself in regards to how stressful her daughters situation is as is her own.   Questions and concerns addressed/Palliative Support Provided.   Objective Assessment: Vital Signs Vitals:   03/17/23 0758 03/17/23 1200  BP: (!) 107/96 110/64  Pulse:  79 80  Resp: 20 18  Temp:  98.6 F (37 C)  SpO2: 98% 100%   No intake or output data in the 24 hours ending 03/17/23 1503 Last Weight  Most recent update: 03/16/2023  5:31 AM    Weight  43.5 kg (95 lb 14.4 oz)            Gen: Middle aged AA F in NAD HEENT: Dry mucous membranes CV: Regular rate and rhythm  PULM:  On RA, breathing is even and nonlabored ABD: soft/nontender  EXT: muscle wasting Neuro: Sleeping  SUMMARY OF RECOMMENDATIONS   DNAR/DNI  Appreciate TOC involvement for Medicaid application and long term placement  Ongoing PMT support  Billing based on MDM: High ______________________________________________________________________________________ Lamarr Lulas [place] Palliative Medicine Team Team Cell Phone: [phone] Please utilize secure chat with additional questions, if there is no response within 30 minutes please call the above phone number  Palliative Medicine Team providers are available by phone from 7am to 7pm daily and can be reached through the team cell phone.  Should this patient require assistance outside of these hours, please call the patient's attending physician.

## 2023-03-17 NOTE — Progress Notes (Signed)
PROGRESS NOTE  Natalie Brown  DOB: [birth year]  PCP: Lavinia Sharps, NP ZOX:[id number]  DOA: 01/22/2023  LOS: 4 days  Hospital Day: 55  Brief narrative: Natalie Brown is a 57 y.o. female with PMH significant for early onset dementia, bipolar affective disorder, catatonia, hypertension, sinus tachycardia, vitamin B12 deficiency, CKD, severe protein calorie malnutrition. Patient was in the hospital for more than 400 days and was discharged to a facility on 10/2 only to be brought back to the ED right away for agitated behavior. Since then, patient remained in the ED for 49 days waiting for a safe disposition plan which could not succeed. While in the ED, she was seen by psychiatry service for worse mental status. 11/17, family is concerned about Zyprexa and Haldol 11/18, psychiatry recommended to discontinue Haldol and continued on Zyprexa, Ativan, hydroxyzine Placement attempts failed 11/21, patient was seen by palliative care.  After discussion with family, patient was made DNR/DNI comfort measures. She was admitted to [facility] for failure to thrive and also to focus on interventions on improving quality of life as she continued to deteriorate in the ED.  Subjective: Patient was seen and examined this morning. Lying down in bed.  Sleeping.  Tries to open eyes on touch.  Unable to follow any commands No family at bedside Seen by neurology this morning.  Assessment and plan: Early onset dementia with behavioral disturbance Bipolar 1 disorder Delirium/Agitation Seen by psychiatry service.   Recommended to continue Zyprexa, Ativan, hydroxyzine with continued safety observation.  Haldol was held at the time. Patient has had episodes of agitation and hence Haldol has been resumed as needed. Neurology consultation appreciated.  Given patient's persistent delirium and disorientation in the last 1 year, patient has very low likelihood of improving back to baseline. Palliative  following  Hypernatremia Unable to use IV hydration because of inability to secure IV line.  [name] agitated and would not allow IV access and even if it is successfully placed, she is at high risk of ripping it out which she has done in the past Encourage oral intake Recent Labs  Lab 03/11/23 0450 03/14/23 0619 03/16/23 0525  NA 145 152* 154*   Weight loss Nutritional deficiencies Continue with diet, supplemental nutrition, vitamins  Goals of care   Code Status: Limited: Do not attempt resuscitation (DNR) -DNR-LIMITED -Do Not Intubate/DNI      DVT prophylaxis:  enoxaparin (LOVENOX) injection 30 mg Start: 03/13/23 2200   Antimicrobials: None Fluid: No IV access.  Unable to secure 1 by IV team on 11/23 Consultants: None Family Communication: None at bedside  Status: Inpatient Level of care:  Med-Surg   Needs to continue in-hospital care: No safe disposition plan    Diet:  Diet Order             Diet regular Room service appropriate? Yes; Fluid consistency: Thin  Diet effective now                   Scheduled Meds:  (feeding supplement) PROSource Plus  30 mL Oral BID BM   acetaminophen  1,000 mg Oral TID   enoxaparin (LOVENOX) injection  30 mg Subcutaneous Q24H   feeding supplement  237 mL Oral BID BM   LORazepam  1 mg Oral TID   multivitamin with minerals  1 tablet Oral Daily   OLANZapine  2.5 mg Oral QHS   sodium chloride flush  3 mL Intravenous Q12H    PRN meds: acetaminophen **OR** acetaminophen,  haloperidol lactate, hydrOXYzine, lactulose, OLANZapine   Infusions:     Antimicrobials: Anti-infectives (From admission, onward)    None       Objective: Vitals:   03/17/23 0758 03/17/23 1200  BP: (!) 107/96 110/64  Pulse: 79 80  Resp: 20 18  Temp:  98.6 F (37 C)  SpO2: 98% 100%   No intake or output data in the 24 hours ending 03/17/23 1352  Filed Weights   03/12/23 1218 03/14/23 0622 03/16/23 0516  Weight: (S) 40.9 kg 47 kg 43.5  kg   Weight change:  Body mass index is 15.48 kg/m.   Physical Exam: General exam: Middle-aged African-American female Skin: No rashes, lesions or ulcers. HEENT: Atraumatic, normocephalic, no obvious bleeding Lungs: Clear to auscultation bilaterally CVS: Regular rate and rhythm, no murmur GI/Abd soft, nontender, nondistended, bowel submitted CNS: Somnolent.  Tries to open eyes on touch. Psychiatry: Restless, agitated Extremities: No pedal edema, no calf tenderness  Data Review: I have personally reviewed the laboratory data and studies available.  F/u labs  Unresulted Labs (From admission, onward)    None      Total time spent in review of labs and imaging, patient evaluation, formulation of plan, documentation and communication with family: 25 minutes  Signed, Lorin Glass, MD [facility] 03/17/2023

## 2023-03-18 MED ORDER — DEXTROSE-SODIUM CHLORIDE 5-0.45 % IV SOLN: INTRAVENOUS | Status: DC

## 2023-03-18 NOTE — TOC Progression Note (Signed)
Transition of Care [facility]) - Progression Note    Patient Details  Name: Natalie Brown MRN: [id number] Date of Birth: 07-May-1965  [facility] CM/SW Contact  Janae Bridgeman, RN Phone Number: 03/18/2023, 10:22 AM  Clinical Narrative:    Patient recently transferred from the ER to [facility] for continued care and need for LTC placement in a locked SNF facility.  No bed offers at this tme.  TOC leadership is aware.        Expected Discharge Plan and Services                                               Social Determinants of Health (SDOH) Interventions SDOH Screenings   Food Insecurity: No Food Insecurity (01/24/2023)  Housing: Low Risk  (01/24/2023)  Transportation Needs: Patient Unable To Answer (01/24/2023)  Utilities: Not At Risk (01/24/2023)  Alcohol Screen: Low Risk  (09/06/2021)  Depression (PHQ2-9): Medium Risk (05/26/2020)  Tobacco Use: High Risk (02/08/2023)    Readmission Risk Interventions     No data to display

## 2023-03-18 NOTE — Progress Notes (Signed)
PROGRESS NOTE  Natalie Brown  DOB: [birth year]  PCP: Natalie Sharps, NP ZOX:[id number]  DOA: 01/22/2023  LOS: 5 days  Hospital Day: 56  Brief narrative: Natalie Brown is a 57 y.o. female with PMH significant for early onset dementia, bipolar affective disorder, catatonia, hypertension, sinus tachycardia, vitamin B12 deficiency, CKD, severe protein calorie malnutrition. Patient was in the hospital for more than 400 days and was discharged to a facility on 10/2 only to be brought back to the ED right away for agitated behavior. Since then, patient remained in the ED for 49 days waiting for a safe disposition plan which could not succeed. While in the ED, she was seen by psychiatry service for worse mental status. 11/17, family is concerned about Zyprexa and Haldol 11/18, psychiatry recommended to discontinue Haldol and continued on Zyprexa, Ativan, hydroxyzine Placement attempts failed 11/21, patient was seen by palliative care.   Ultimately, she was admitted to [facility] for failure to thrive and also to focus on interventions on improving quality of life as she continued to deteriorate in the ED.  Subjective: Patient was seen and examined this morning. Lying down in bed.  Not conversational.  Marden Noble.  No family at bedside.  Assessment and plan: Early onset dementia with behavioral disturbance Bipolar 1 disorder Delirium/Agitation Seen by neurology as well as psychiatry service. Currently continued on Zyprexa, Ativan, hydroxyzine and as needed Haldol with continued safety observation.   Per neurology note 11/25, given patient's persistent delirium and disorientation in the last 1 year, patient has very low likelihood of improving back to baseline. Palliative following as well.  Hypernatremia Sodium level uptrending.  Was 154 on last lab on 11/24.  Patient has intermittent agitation and does not comply with blood draw IV access.  Oral intake was encouraged but I do not know if that  is enough to correct sodium level. I have asked nursing staff today to try and IV access.  I have ordered for D5 half NS at 75 mL/h.  To repeat BMP tomorrow. Recent Labs  Lab 03/14/23 0619 03/16/23 0525  NA 152* 154*   Weight loss Nutritional deficiencies Continue with diet, supplemental nutrition, vitamins  Goals of care   Code Status: Limited: Do not attempt resuscitation (DNR) -DNR-LIMITED -Do Not Intubate/DNI      DVT prophylaxis:  enoxaparin (LOVENOX) injection 30 mg Start: 03/13/23 2200   Antimicrobials: None Fluid: No IV access.  Unable to secure 1 by IV team on 11/23 Consultants: None Family Communication: None at bedside  Status: Inpatient Level of care:  Med-Surg   Needs to continue in-hospital care: No safe disposition plan    Diet:  Diet Order             Diet regular Room service appropriate? Yes; Fluid consistency: Thin  Diet effective now                   Scheduled Meds:  (feeding supplement) PROSource Plus  30 mL Oral BID BM   acetaminophen  1,000 mg Oral TID   enoxaparin (LOVENOX) injection  30 mg Subcutaneous Q24H   feeding supplement  237 mL Oral BID BM   LORazepam  1 mg Oral TID   multivitamin with minerals  1 tablet Oral Daily   OLANZapine  2.5 mg Oral QHS   sodium chloride flush  3 mL Intravenous Q12H    PRN meds: acetaminophen **OR** acetaminophen, haloperidol lactate, hydrOXYzine, lactulose, OLANZapine   Infusions:   dextrose 5 % and  0.45 % NaCl       Antimicrobials: Anti-infectives (From admission, onward)    None       Objective: Vitals:   03/17/23 0758 03/17/23 1200  BP: (!) 107/96 110/64  Pulse: 79 80  Resp: 20 18  Temp:  98.6 F (37 C)  SpO2: 98% 100%    Intake/Output Summary (Last 24 hours) at 03/18/2023 1109 Last data filed at 03/17/2023 1614 Gross per 24 hour  Intake 120 ml  Output --  Net 120 ml    Filed Weights   03/14/23 0622 03/16/23 0516 03/18/23 0500  Weight: 47 kg 43.5 kg 43.4 kg    Weight change:  Body mass index is 15.45 kg/m.   Physical Exam: General exam: Middle-aged African-American female Skin: No rashes, lesions or ulcers. HEENT: Atraumatic, normocephalic, no obvious bleeding Lungs: Clear to auscultation bilaterally CVS: Regular rate and rhythm, no murmur GI/Abd soft, nontender, nondistended, bowel submitted CNS: Somnolent. Tries to open eyes on touch. Psychiatry: somewhat restless, agitated now. Extremities: No pedal edema, no calf tenderness  Data Review: I have personally reviewed the laboratory data and studies available.  F/u labs  Unresulted Labs (From admission, onward)    None      Total time spent in review of labs and imaging, patient evaluation, formulation of plan, documentation and communication with family: 25 minutes  Signed, Lorin Glass, MD [facility] 03/18/2023

## 2023-03-18 NOTE — Plan of Care (Signed)
?  Problem: Safety: ?Goal: Non-violent Restraint(s) ?Outcome: Progressing ?  ?Problem: Education: ?Goal: Knowledge of General Education information will improve ?Description: Including pain rating scale, medication(s)/side effects and non-pharmacologic comfort measures ?Outcome: Progressing ?  ?Problem: Health Behavior/Discharge Planning: ?Goal: Ability to manage health-related needs will improve ?Outcome: Progressing ?  ?Problem: Clinical Measurements: ?Goal: Ability to maintain clinical measurements within normal limits will improve ?Outcome: Progressing ?  ?

## 2023-03-18 NOTE — Plan of Care (Signed)

## 2023-03-18 NOTE — Progress Notes (Signed)
Pt pulling at IV line and trying to get oob, being combative with staff.  Pt assisted back to bed via 5 staff members.  MD notified and order for lap belt retraint ordered and placed.  Pt already in mitts but she bites at them frequently.  Pt recently medicated.  Sitter at bs.  Bed sheets changed as they were soiled with blood from her abrased elbow.  Foam dressing placed on elbow abrasion.  Pt given water and icecream.  Will cont to observe.

## 2023-03-19 LAB — BASIC METABOLIC PANEL
Anion gap: 5 (ref 5–15)
BUN: 41 mg/dL — ABNORMAL HIGH (ref 6–20)
CO2: 25 mmol/L (ref 22–32)
Calcium: 8.6 mg/dL — ABNORMAL LOW (ref 8.9–10.3)
Chloride: 126 mmol/L — ABNORMAL HIGH (ref 98–111)
Creatinine, Ser: 1.36 mg/dL — ABNORMAL HIGH (ref 0.44–1.00)
GFR, Estimated: 46 mL/min — ABNORMAL LOW (ref >60)
Glucose, Bld: 114 mg/dL — ABNORMAL HIGH (ref 70–99)
Potassium: 4.2 mmol/L (ref 3.5–5.1)
Sodium: 156 mmol/L — ABNORMAL HIGH (ref 135–145)

## 2023-03-19 LAB — CBC WITH DIFFERENTIAL/PLATELET
Abs Immature Granulocytes: 0.01 10*3/uL (ref 0.00–0.07)
Basophils Absolute: 0 10*3/uL (ref 0.0–0.1)
Basophils Relative: 1 %
Eosinophils Absolute: 0.1 10*3/uL (ref 0.0–0.5)
Eosinophils Relative: 2 %
HCT: 41.6 % (ref 36.0–46.0)
Hemoglobin: 13.1 g/dL (ref 12.0–15.0)
Immature Granulocytes: 0 %
Lymphocytes Relative: 37 %
Lymphs Abs: 1.8 10*3/uL (ref 0.7–4.0)
MCH: 28.9 pg (ref 26.0–34.0)
MCHC: 31.5 g/dL (ref 30.0–36.0)
MCV: 91.6 fL (ref 80.0–100.0)
Monocytes Absolute: 0.3 10*3/uL (ref 0.1–1.0)
Monocytes Relative: 6 %
Neutro Abs: 2.6 10*3/uL (ref 1.7–7.7)
Neutrophils Relative %: 54 %
Platelets: 257 10*3/uL (ref 150–400)
RBC: 4.54 MIL/uL (ref 3.87–5.11)
RDW: 13.4 % (ref 11.5–15.5)
WBC: 4.9 10*3/uL (ref 4.0–10.5)
nRBC: 0 % (ref 0.0–0.2)

## 2023-03-19 MED ORDER — DEXTROSE 5 % IV SOLN: INTRAVENOUS | Status: DC

## 2023-03-19 NOTE — Progress Notes (Signed)
[facility] PROGRESS NOTE    Progress Note  Natalie Brown  UEA:[id number] DOB: 05/12/1965 DOA: 01/22/2023 PCP: Lavinia Sharps, NP     Brief Narrative:   Natalie Brown is an 57 y.o. female history significant for early onset dementia, bipolar disorder, catatonia essential hypertension sinus tachycardia B12 deficiency severe protein caloric malnutrition he was in the hospital for more than 400 days discharged on 01/22/2023 only to come back to the ED after 49 days due to agitation.  While in the ED evaluated by psychiatry who made changes to his medication.  Patient is currently a DNR/DNI patient was admitted to try to improve patient environment, circadian rhythm and provide a window which can help improve the patient psychiatric status.  Palliative care is also on board.   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 Continue Zyprexa Ativan hydroxyzine and Haldol as needed. Active care is following she is currently a DNR/DNI. Agitation has resolved. Patient unlikely to improve back to baseline. Further management per psychiatry. Will need placement at a rehab facility.  Hypovolemic hypernatremia: Discontinue D5 with potassium supplement and half-normal saline. Started on D5 recheck basic metabolic panel tomorrow morning.  Weight loss: Continue diet and iron supplementation.  Sinus tachycardia Likely due to agitation and improved plus minus dehydration.  Essential hypertension Well-controlled off antihypertensive medication continue to monitor.  Protein caloric malnutrition moderate: Continue diet Ensure 3 times daily.    DVT prophylaxis: lovenox Family Communication:none Status is: Inpatient Remains inpatient appropriate because: Acute agitation.    Code Status:     Code Status Orders  (From admission, onward)           Start     Ordered   03/13/23 1329   Do not attempt resuscitation (DNR)- Limited -Do Not Intubate (DNI)  Continuous       Question Answer Comment  If pulseless and not breathing No CPR or chest compressions.   In Pre-Arrest Conditions (Patient Is Breathing and Has A Pulse) Do not intubate. Provide all appropriate non-invasive medical interventions. Avoid ICU transfer unless indicated or required.   Consent: Discussion documented in EHR or advanced directives reviewed      03/13/23 1330           Code Status History     Date Active Date Inactive Code Status Order ID Comments User Context   03/13/2023 1020 03/13/2023 1330 Limited: Do not attempt resuscitation (DNR) -DNR-LIMITED -Do Not Intubate/DNI  [id number]  Barbara Cower, NP ED   03/12/2023 1250 03/13/2023 1020 Do not attempt resuscitation (DNR) - Comfort care [id number]  Barbara Cower, NP ED   12/18/2022 1559 01/22/2023 1531 Do not attempt resuscitation (DNR) - Comfort care [id number]  Canary Brim, NP Inpatient   03/24/2022 1001 12/18/2022 1559 Full Code [id number]  Leroy Sea, MD Inpatient   12/24/2021 2334 12/26/2021 0159 Full Code [id number]  Orland Mustard, MD ED   12/24/2021 1935 12/24/2021 2334 Full Code [id number]  Orland Mustard, MD ED   12/24/2021 1908 12/24/2021 1935 Full Code [id number]  Orland Mustard, MD Inpatient   09/06/2021 2224 12/24/2021 1908 Full Code [id number]  [name], NP Inpatient   09/06/2021 2224 09/06/2021 2224 Full Code [id number]  [name], NP Inpatient   09/05/2021 1528 09/06/2021 2140 Full Code [id number]  Gwyneth Sprout, MD ED   05/26/2020 1636 05/29/2020 1818 Full Code [id number]  Leevy-Johnson, Lina Sar, NP ED  IV Access:   Peripheral IV   Procedures and diagnostic studies:   No results found.   Medical Consultants:   None.   Subjective:    Natalie Brown nonverbal this morning.  Objective:    Vitals:   03/18/23 0500 03/18/23 2147 03/19/23 0423 03/19/23 0737  BP:  (P) 118/76 101/68 106/69  Pulse:  (!) (P)  101 71 72  Resp:   17 18  Temp:   (!) 97.5 F (36.4 C) 98.1 F (36.7 C)  TempSrc:   Oral Oral  SpO2:   100% 100%  Weight: 43.4 kg     Height:       SpO2: 100 %   Intake/Output Summary (Last 24 hours) at 03/19/2023 1004 Last data filed at 03/18/2023 1800 Gross per 24 hour  Intake 300 ml  Output --  Net 300 ml   Filed Weights   03/14/23 0622 03/16/23 0516 03/18/23 0500  Weight: 47 kg 43.5 kg 43.4 kg    Exam: General exam: In no acute distress. Respiratory system: Good air movement and clear to auscultation. Cardiovascular system: S1 & S2 heard, RRR. No JVD.  Gastrointestinal system: Abdomen is nondistended, soft and nontender.  Extremities: No pedal edema. Skin: No rashes, lesions or ulcers Psychiatry: No judgment or insight in medical condition.   Data Reviewed:    Labs: Basic Metabolic Panel: Recent Labs  Lab 03/14/23 0619 03/16/23 0525 03/19/23 0449  NA 152* 154* 156*  K 4.1 4.1 4.2  CL 115* 117* 126*  CO2 28 26 25   GLUCOSE 105* 81 114*  BUN 39* 33* 41*  CREATININE 1.42* 1.63* 1.36*  CALCIUM 9.0 9.2 8.6*   GFR Estimated Creatinine Clearance: 32 mL/min (A) (by C-G formula based on SCr of 1.36 mg/dL (H)). Liver Function Tests: Recent Labs  Lab 03/14/23 0619  AST 43*  ALT 57*  ALKPHOS 56  BILITOT 0.4  PROT 6.8  ALBUMIN 3.2*   No results for input(s): "LIPASE", "AMYLASE" in the last 168 hours. No results for input(s): "AMMONIA" in the last 168 hours. Coagulation profile No results for input(s): "INR", "PROTIME" in the last 168 hours. COVID-19 Labs  No results for input(s): "DDIMER", "FERRITIN", "LDH", "CRP" in the last 72 hours.  Lab Results  Component Value Date   SARSCOV2NAA NEGATIVE 01/22/2023   SARSCOV2NAA NEGATIVE 05/14/2022   SARSCOV2NAA POSITIVE (A) 01/12/2022   SARSCOV2NAA NEGATIVE 12/24/2021    CBC: Recent Labs  Lab 03/14/23 0619 03/19/23 0449  WBC 7.8 4.9  NEUTROABS  --  2.6  HGB 14.4 13.1  HCT 43.8 41.6  MCV 89.2 91.6   PLT 270 257   Cardiac Enzymes: No results for input(s): "CKTOTAL", "CKMB", "CKMBINDEX", "TROPONINI" in the last 168 hours. BNP (last 3 results) No results for input(s): "PROBNP" in the last 8760 hours. CBG: No results for input(s): "GLUCAP" in the last 168 hours. D-Dimer: No results for input(s): "DDIMER" in the last 72 hours. Hgb A1c: No results for input(s): "HGBA1C" in the last 72 hours. Lipid Profile: No results for input(s): "CHOL", "HDL", "LDLCALC", "TRIG", "CHOLHDL", "LDLDIRECT" in the last 72 hours. Thyroid function studies: No results for input(s): "TSH", "T4TOTAL", "T3FREE", "THYROIDAB" in the last 72 hours.  Invalid input(s): "FREET3" Anemia work up: No results for input(s): "VITAMINB12", "FOLATE", "FERRITIN", "TIBC", "IRON", "RETICCTPCT" in the last 72 hours. Sepsis Labs: Recent Labs  Lab 03/14/23 0619 03/19/23 0449  WBC 7.8 4.9   Microbiology No results found for this or any previous visit (from the past 240  hour(s)).   Medications:    (feeding supplement) PROSource Plus  30 mL Oral BID BM   acetaminophen  1,000 mg Oral TID   enoxaparin (LOVENOX) injection  30 mg Subcutaneous Q24H   feeding supplement  237 mL Oral BID BM   LORazepam  1 mg Oral TID   multivitamin with minerals  1 tablet Oral Daily   OLANZapine  2.5 mg Oral QHS   sodium chloride flush  3 mL Intravenous Q12H   Continuous Infusions:  dextrose 5 % and 0.45 % NaCl 75 mL/hr at 03/19/23 0112      LOS: 6 days   Marinda Elk  [facility]  03/19/2023, 10:04 AM

## 2023-03-19 NOTE — Plan of Care (Signed)
  Problem: Safety: Goal: Non-violent Restraint(s) Outcome: Progressing   Problem: Safety: Goal: Non-violent Restraint(s) Outcome: Progressing   Problem: Education: Goal: Knowledge of General Education information will improve Description: Including pain rating scale, medication(s)/side effects and non-pharmacologic comfort measures Outcome: Progressing   Problem: Health Behavior/Discharge Planning: Goal: Ability to manage health-related needs will improve Outcome: Progressing   Problem: Clinical Measurements: Goal: Ability to maintain clinical measurements within normal limits will improve Outcome: Progressing Goal: Will remain free from infection Outcome: Progressing Goal: Diagnostic test results will improve Outcome: Progressing Goal: Respiratory complications will improve Outcome: Progressing Goal: Cardiovascular complication will be avoided Outcome: Progressing

## 2023-03-19 NOTE — Plan of Care (Signed)
  Problem: Safety: Goal: Non-violent Restraint(s) 03/19/2023 0039 by Lisabeth Pick, RN Outcome: Progressing 03/18/2023 2211 by Lisabeth Pick, RN Outcome: Progressing   Problem: Education: Goal: Knowledge of General Education information will improve Description: Including pain rating scale, medication(s)/side effects and non-pharmacologic comfort measures 03/19/2023 0039 by Lisabeth Pick, RN Outcome: Progressing 03/18/2023 2211 by Lisabeth Pick, RN Outcome: Progressing   Problem: Health Behavior/Discharge Planning: Goal: Ability to manage health-related needs will improve 03/19/2023 0039 by Lisabeth Pick, RN Outcome: Progressing 03/18/2023 2211 by Lisabeth Pick, RN Outcome: Progressing   Problem: Clinical Measurements: Goal: Ability to maintain clinical measurements within normal limits will improve 03/19/2023 0039 by Lisabeth Pick, RN Outcome: Progressing 03/18/2023 2211 by Lisabeth Pick, RN Outcome: Progressing Goal: Will remain free from infection 03/19/2023 0039 by Lisabeth Pick, RN Outcome: Progressing 03/18/2023 2211 by Lisabeth Pick, RN Outcome: Progressing Goal: Diagnostic test results will improve 03/19/2023 0039 by Lisabeth Pick, RN Outcome: Progressing 03/18/2023 2211 by Lisabeth Pick, RN Outcome: Progressing Goal: Respiratory complications will improve 03/19/2023 0039 by Lisabeth Pick, RN Outcome: Progressing 03/18/2023 2211 by Lisabeth Pick, RN Outcome: Progressing Goal: Cardiovascular complication will be avoided 03/19/2023 0039 by Lisabeth Pick, RN Outcome: Progressing 03/18/2023 2211 by Lisabeth Pick, RN Outcome: Progressing   Problem: Activity: Goal: Risk for activity intolerance will decrease 03/19/2023 0039 by Lisabeth Pick, RN Outcome: Progressing 03/18/2023 2211 by Lisabeth Pick, RN Outcome: Progressing   Problem: Nutrition: Goal: Adequate nutrition will be maintained 03/19/2023 0039  by Lisabeth Pick, RN Outcome: Progressing 03/18/2023 2211 by Lisabeth Pick, RN Outcome: Progressing   Problem: Coping: Goal: Level of anxiety will decrease 03/19/2023 0039 by Lisabeth Pick, RN Outcome: Progressing 03/18/2023 2211 by Lisabeth Pick, RN Outcome: Progressing   Problem: Elimination: Goal: Will not experience complications related to bowel motility 03/19/2023 0039 by Lisabeth Pick, RN Outcome: Progressing 03/18/2023 2211 by Lisabeth Pick, RN Outcome: Progressing Goal: Will not experience complications related to urinary retention 03/19/2023 0039 by Lisabeth Pick, RN Outcome: Progressing 03/18/2023 2211 by Lisabeth Pick, RN Outcome: Progressing   Problem: Pain Management: Goal: General experience of comfort will improve 03/19/2023 0039 by Lisabeth Pick, RN Outcome: Progressing 03/18/2023 2211 by Lisabeth Pick, RN Outcome: Progressing   Problem: Safety: Goal: Ability to remain free from injury will improve 03/19/2023 0039 by Lisabeth Pick, RN Outcome: Progressing 03/18/2023 2211 by Lisabeth Pick, RN Outcome: Progressing   Problem: Skin Integrity: Goal: Risk for impaired skin integrity will decrease 03/19/2023 0039 by Lisabeth Pick, RN Outcome: Progressing 03/18/2023 2211 by Lisabeth Pick, RN Outcome: Progressing   Problem: Safety: Goal: Non-violent Restraint(s) 03/19/2023 0039 by Lisabeth Pick, RN Outcome: Progressing 03/18/2023 2211 by Lisabeth Pick, RN Outcome: Progressing

## 2023-03-19 NOTE — Plan of Care (Signed)
Pt had no behavioral issues this shift. Restraints removed. Evening ativan held due to lethargy. Pt had low oral intake, only eating 1-2 bites of meals.   Problem: Safety: Goal: Non-violent Restraint(s) Outcome: Progressing Note: Pt restraints removed this morning

## 2023-03-20 LAB — BASIC METABOLIC PANEL
Anion gap: 10 (ref 5–15)
BUN: 29 mg/dL — ABNORMAL HIGH (ref 6–20)
CO2: 24 mmol/L (ref 22–32)
Calcium: 8.5 mg/dL — ABNORMAL LOW (ref 8.9–10.3)
Chloride: 117 mmol/L — ABNORMAL HIGH (ref 98–111)
Creatinine, Ser: 1.3 mg/dL — ABNORMAL HIGH (ref 0.44–1.00)
GFR, Estimated: 49 mL/min — ABNORMAL LOW (ref >60)
Glucose, Bld: 84 mg/dL (ref 70–99)
Potassium: 3.9 mmol/L (ref 3.5–5.1)
Sodium: 151 mmol/L — ABNORMAL HIGH (ref 135–145)

## 2023-03-20 MED ORDER — LORAZEPAM 2 MG/ML IJ SOLN: 1.0000 mg | Freq: Three times a day (TID) | INTRAMUSCULAR | Status: DC | PRN

## 2023-03-20 MED ORDER — DEXTROSE 5 % IV SOLN: INTRAVENOUS | Status: AC

## 2023-03-20 MED ORDER — LORAZEPAM 2 MG/ML PO CONC
0.5000 mg | Freq: Three times a day (TID) | ORAL | Status: DC
  Administered 2023-03-20 – 2023-05-19 (×154): 0.5 mg via ORAL
  Filled 2023-03-20 (×157): qty 1

## 2023-03-20 MED ORDER — LORAZEPAM 2 MG/ML PO CONC
0.5000 mg | Freq: Three times a day (TID) | ORAL | Status: DC | PRN
  Filled 2023-03-20: qty 1

## 2023-03-20 NOTE — Progress Notes (Addendum)
   Palliative Medicine Inpatient Follow Up Note HPI: Natalie Brown is a 57 y.o. female with PMH significant for early onset dementia, bipolar affective disorder, catatonia, hypertension, sinus tachycardia, vitamin B12 deficiency, CKD, severe protein calorie malnutrition. Palliative care is involved to support additional goals of care conversations.   Today's Discussion 03/20/2023  *Please note that this is a verbal dictation therefore any spelling or grammatical errors are due to the "[employer]" system interpretation.  Chart reviewed inclusive of vital signs, progress notes, laboratory results, and diagnostic images. Psychiatry involved today for medication management.   I met with Natalie Brown at bedside this afternoon in the presence of her nursing technician. [name] somnolent overall and not responsive to me. Per patients sitter she was able to arouse enough to eat and drink. [name] again, known to the staff on [facility]. Her sitter notes that prior to discharge in October she was able to write on paper, use her phone, and mobilize. She shares that she is quite different from then to now.   Family was at bedside earlier in the day though not during the time of my assessment.  ____________________ Addendum:  I called and spoke with patients mother, Natalie Brown. We discussed the above. She shares that she is glad Dr. Gasper Sells is re-involved in her daughters care.   Natalie Brown goes on to share with me how hard her new diagnosis of cancer is on her. Provided time for her to express herself. Offered support through therapeutic listening.   Questions and concerns addressed/Palliative Support Provided.   Add time: 20  Objective Assessment: Vital Signs Vitals:   03/19/23 2005 03/20/23 0444  BP: 101/66 105/66  Pulse: 93 (!) 56  Resp: 19 16  Temp: 98 F (36.7 C) 97.6 F (36.4 C)  SpO2: 100% 98%    Intake/Output Summary (Last 24 hours) at 03/20/2023 1445 Last data filed at 03/20/2023 0654 Gross  per 24 hour  Intake --  Output 0 ml  Net 0 ml   Last Weight  Most recent update: 03/20/2023  6:30 AM    Weight  47.4 kg (104 lb 8 oz)            Gen: Middle aged AA F in NAD HEENT: Dry mucous membranes CV: Regular rate and rhythm  PULM:  On RA, breathing is even and nonlabored ABD: soft/nontender  EXT: muscle wasting Neuro: Sleeping  SUMMARY OF RECOMMENDATIONS   DNAR/DNI  Appreciate TOC involvement for Medicaid application and long term placement  PMT will remain peripheral at this time   Time: 25 ______________________________________________________________________________________ Lamarr Lulas [place] Palliative Medicine Team Team Cell Phone: [phone] Please utilize secure chat with additional questions, if there is no response within 30 minutes please call the above phone number  Palliative Medicine Team providers are available by phone from 7am to 7pm daily and can be reached through the team cell phone.  Should this patient require assistance outside of these hours, please call the patient's attending physician.

## 2023-03-20 NOTE — Progress Notes (Signed)
[facility] PROGRESS NOTE    Progress Note  Natalie Brown  ONG:[id number] DOB: 1966-03-20 DOA: 01/22/2023 PCP: Lavinia Sharps, NP     Brief Narrative:   Natalie Brown is an 57 y.o. female history significant for early onset dementia, bipolar disorder, catatonia essential hypertension sinus tachycardia B12 deficiency severe protein caloric malnutrition he was in the hospital for more than 400 days discharged on 01/22/2023 only to come back to the ED after 49 days due to agitation.  While in the ED evaluated by psychiatry who made changes to his medication.  Patient is currently a DNR/DNI patient was admitted to try to improve patient environment, circadian rhythm and provide a window which can help improve the patient psychiatric status.  Palliative care is also on board.   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 Continue Zyprexa Ativan hydroxyzine and Haldol as needed. Palliative care is following she is currently a DNR/DNI. Agitation has resolved. Patient unlikely to improve back to baseline per neurology. Further management per psychiatry. Will need placement at a rehab facility.  TOC is involved awaiting Medicaid application for long-term placement. Patient appears overly sedated, will reconsult psychiatry to reevaluate medication.  Hypovolemic hypernatremia: Currently on D5, basic metabolic panels pending this morning.  Weight loss: Continue diet and iron supplementation.  Sinus tachycardia Likely due to agitation and improved plus minus dehydration. Now improved.  Essential hypertension Well-controlled off antihypertensive medication continue to monitor.  Protein caloric malnutrition moderate: Continue diet Ensure 3 times daily.    DVT prophylaxis: lovenox Family Communication:none Status is: Inpatient Remains inpatient appropriate because: Acute  agitation.    Code Status:     Code Status Orders  (From admission, onward)           Start     Ordered   03/13/23 1329  Do not attempt resuscitation (DNR)- Limited -Do Not Intubate (DNI)  Continuous       Question Answer Comment  If pulseless and not breathing No CPR or chest compressions.   In Pre-Arrest Conditions (Patient Is Breathing and Has A Pulse) Do not intubate. Provide all appropriate non-invasive medical interventions. Avoid ICU transfer unless indicated or required.   Consent: Discussion documented in EHR or advanced directives reviewed      03/13/23 1330           Code Status History     Date Active Date Inactive Code Status Order ID Comments User Context   03/13/2023 1020 03/13/2023 1330 Limited: Do not attempt resuscitation (DNR) -DNR-LIMITED -Do Not Intubate/DNI  [id number]  Barbara Cower, NP ED   03/12/2023 1250 03/13/2023 1020 Do not attempt resuscitation (DNR) - Comfort care [id number]  Barbara Cower, NP ED   12/18/2022 1559 01/22/2023 1531 Do not attempt resuscitation (DNR) - Comfort care [id number]  Canary Brim, NP Inpatient   03/24/2022 1001 12/18/2022 1559 Full Code [id number]  Leroy Sea, MD Inpatient   12/24/2021 2334 12/26/2021 0159 Full Code [id number]  Orland Mustard, MD ED   12/24/2021 1935 12/24/2021 2334 Full Code [id number]  Orland Mustard, MD ED   12/24/2021 1908 12/24/2021 1935 Full Code [id number]  Orland Mustard, MD Inpatient   09/06/2021 2224 12/24/2021 1908 Full Code [id number]  [name], NP Inpatient   09/06/2021 2224 09/06/2021 2224 Full Code [id number]  [name], NP Inpatient   09/05/2021 1528 09/06/2021 2140 Full Code [id number]  Gwyneth Sprout, MD ED   05/26/2020  1636 05/29/2020 1818 Full Code [id number]  Loletta Parish, NP ED         IV Access:   Peripheral IV   Procedures and diagnostic studies:   No results found.   Medical Consultants:   None.   Subjective:    Natalie Brown nonverbal  Objective:     Vitals:   03/19/23 0737 03/19/23 1838 03/19/23 2005 03/20/23 0444  BP: 106/69 113/75 101/66 105/66  Pulse: 72 (!) 118 93 (!) 56  Resp: 18 18 19 16   Temp: 98.1 F (36.7 C) 97.8 F (36.6 C) 98 F (36.7 C) 97.6 F (36.4 C)  TempSrc: Oral Axillary Axillary Axillary  SpO2: 100% 100% 100% 98%  Weight:    47.4 kg  Height:       SpO2: 98 %   Intake/Output Summary (Last 24 hours) at 03/20/2023 0909 Last data filed at 03/20/2023 0654 Gross per 24 hour  Intake --  Output 0 ml  Net 0 ml   Filed Weights   03/16/23 0516 03/18/23 0500 03/20/23 0444  Weight: 43.5 kg 43.4 kg 47.4 kg    Exam: General exam: In no acute distress. Respiratory system: Good air movement and clear to auscultation. Cardiovascular system: S1 & S2 heard, RRR. No JVD. Gastrointestinal system: Abdomen is nondistended, soft and nontender.  Extremities: No pedal edema. Skin: No rashes, lesions or ulcers Psychiatry: No judgment or insight of medical condition.  Data Reviewed:    Labs: Basic Metabolic Panel: Recent Labs  Lab 03/14/23 0619 03/16/23 0525 03/19/23 0449  NA 152* 154* 156*  K 4.1 4.1 4.2  CL 115* 117* 126*  CO2 28 26 25   GLUCOSE 105* 81 114*  BUN 39* 33* 41*  CREATININE 1.42* 1.63* 1.36*  CALCIUM 9.0 9.2 8.6*   GFR Estimated Creatinine Clearance: 35 mL/min (A) (by C-G formula based on SCr of 1.36 mg/dL (H)). Liver Function Tests: Recent Labs  Lab 03/14/23 0619  AST 43*  ALT 57*  ALKPHOS 56  BILITOT 0.4  PROT 6.8  ALBUMIN 3.2*   No results for input(s): "LIPASE", "AMYLASE" in the last 168 hours. No results for input(s): "AMMONIA" in the last 168 hours. Coagulation profile No results for input(s): "INR", "PROTIME" in the last 168 hours. COVID-19 Labs  No results for input(s): "DDIMER", "FERRITIN", "LDH", "CRP" in the last 72 hours.  Lab Results  Component Value Date   SARSCOV2NAA NEGATIVE 01/22/2023   SARSCOV2NAA NEGATIVE 05/14/2022   SARSCOV2NAA POSITIVE (A)  01/12/2022   SARSCOV2NAA NEGATIVE 12/24/2021    CBC: Recent Labs  Lab 03/14/23 0619 03/19/23 0449  WBC 7.8 4.9  NEUTROABS  --  2.6  HGB 14.4 13.1  HCT 43.8 41.6  MCV 89.2 91.6  PLT 270 257   Cardiac Enzymes: No results for input(s): "CKTOTAL", "CKMB", "CKMBINDEX", "TROPONINI" in the last 168 hours. BNP (last 3 results) No results for input(s): "PROBNP" in the last 8760 hours. CBG: No results for input(s): "GLUCAP" in the last 168 hours. D-Dimer: No results for input(s): "DDIMER" in the last 72 hours. Hgb A1c: No results for input(s): "HGBA1C" in the last 72 hours. Lipid Profile: No results for input(s): "CHOL", "HDL", "LDLCALC", "TRIG", "CHOLHDL", "LDLDIRECT" in the last 72 hours. Thyroid function studies: No results for input(s): "TSH", "T4TOTAL", "T3FREE", "THYROIDAB" in the last 72 hours.  Invalid input(s): "FREET3" Anemia work up: No results for input(s): "VITAMINB12", "FOLATE", "FERRITIN", "TIBC", "IRON", "RETICCTPCT" in the last 72 hours. Sepsis Labs: Recent Labs  Lab 03/14/23 [phone] 03/19/23 0449  WBC 7.8 4.9   Microbiology No results found for this or any previous visit (from the past 240 hour(s)).   Medications:    (feeding supplement) PROSource Plus  30 mL Oral BID BM   acetaminophen  1,000 mg Oral TID   enoxaparin (LOVENOX) injection  30 mg Subcutaneous Q24H   feeding supplement  237 mL Oral BID BM   LORazepam  1 mg Oral TID   multivitamin with minerals  1 tablet Oral Daily   OLANZapine  2.5 mg Oral QHS   sodium chloride flush  3 mL Intravenous Q12H   Continuous Infusions:  dextrose 100 mL/hr at 03/19/23 1640      LOS: 7 days   Marinda Elk  [facility]  03/20/2023, 9:09 AM

## 2023-03-20 NOTE — Plan of Care (Signed)
Patient lethargic throughout shift. Team is aware and medications adjusted. Ativan dose held. Pt has poor oral intake only consuming magic cups on meal tray.   Problem: Nutrition: Goal: Adequate nutrition will be maintained Outcome: Not Progressing   Problem: Safety: Goal: Non-violent Restraint(s) Outcome: Completed/Met   Problem: Safety: Goal: Non-violent Restraint(s) Outcome: Completed/Met   Problem: Safety: Goal: Ability to remain free from injury will improve Outcome: Progressing

## 2023-03-20 NOTE — Consult Note (Addendum)
Addendum: c/s order placed, will follow.   [facility] Psychiatry Followup Face-to-Face Psychiatric Evaluation   Name: Natalie Brown DOB: 1965/12/08 MRN: [id number] Service Date: March 20, 2023 LOS:  LOS: 7 days  Reason for Consult: sedation Referring Provider: None - no consult order placed   Assessment  Natalie Brown is a 57 y.o. female admitted medically for 01/22/2023  3:31 PM for possible catatonia/altered mental status. She carries the psychiatric diagnoses of bipolar affective disorder with psychotic symptoms and has a past medical history of hypertension. This is the first time the psychiatry team has seen her this hotpitalization.   This patient has had an unfortunate, rapid decline over the year+. She was initially admitted to the behavioral health hospital after decompensating with hx bipolar I disorder (was sleeping outside in the rain, confused, issues remaining on task) in 08/2021. Her psychosis and mania stabilized after about 4 weeks of admission w/ multiple medication trials (Zyprexa, haldol, and Invega). This admission revealed a significant and rapidly progressive dementia as despite improvement in psychiatric symptoms, she never returned to baseline. She had a prolonged admission to behavioral health due to no safe discharge options and was transferred to the hospital in 12/2021 after 4 months after developing delirium/encephalopathy while admitted to [facility]. She was admitted to [facility] from 12/2021 to 01/2023; was discharged ot a care facility and came back to the ED within <24 hours due to agitation. Over the past year, she has had periods of becoming almost totally nonverbal with staff (more likely to interact with family) however has not been overtly manic or psychotic during that time span; dementia dx and sx has largely superceded psychiatric clinical features. Psychiatry consult team has been involved intermittently in her care over the last year (saw at admission  12/2021 when [facility] regimen continued, in 06/2022, in 10/2022, in 12/2022 and a few times in the ED in 02/2023. Briefly, changes made by psychiatry team included tapering off of haloperidol d/t rigidity/dystonia - attempted to transition to paliperidone however mother/guardian did not pick up the phone and eventual diagnosis of comorbid catatonia an starting lorazepam with good clinical response (began walking, talking, eating). She had previously regressed with increase of olanzapine which was tapered off; at some point was restarted on 2.5 mg at bedtime. Her psychiatric medications at time of consult are listed below. We are not charging for this encounter as no consult order placed.   On evaluation 11/28 pt was not rousable to voice or light touch although did briefly withdraw in pain during physical exam. She was fairly rigid in all 4 extremities and continued to hold her legs in bizarre position (this is present from prior evaluations). Recommend medical w/u of AMS in demented pt such as CXR, u/a, etc. Otherwise made changes as below with primary goal of reducing polypharmacy, oversedation and ?rigidity from antipsychotics - stopped all antipsychotics and will use low dose lorazeapam as sole standing med and PRN (r/b/se prev d/w guardian by myself in detail and reviewed today).  Feel risk >>>> benefit of formal ativan trial at this time (encephalopathy due to medical cause remains most likely driver of AMS). Do not think meds primary driver of AMS as they have been the same for some time.   PLEASE CALL MOTHER PRIOR TO ALL NON-EMERGENT MED CHANGES Psychiatry team is not following; no consult order placed this admission. Current sx moslty 2/2 dementia. Further management per primary. Please place a consult order if further consultation is desired. I  have no ability to add patients to the list. This was communicated.   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   ## Medications:  At time of evaluation psych medications included: Standing:  Lorazepam 0.5 mg TID (catatonia) - recently reduced from 1 TID.  Olanzapine 2.5 mg at bedtime (agitation/underlying bipolarity)  PRN:  Haldol 2 mg q6 PRN (agitation) Hydroxyzine 25 mg TID PRN (anxiety) Olanzapine 2.5 mg q6 PRN (agitation).   Changes made by psychiatry team include: STOPPED olanzapine 2.5 mg at bedtime (prior dx catatonia, oversedated) STOPPED hydroxyzine 25 mg TID PRN (polypharmacy) STOPPED haloperidol 2 mg q5 PRN (prior dystonia) STOPPED olanzapine 2.5 q6 PRN (prior catatonia - although better option than haloperidol, might restart) STARTED lorazepam 0.5 mg TID PRN anxiety/agitation (prior catatonia) STARTED lorazepam 1 mg IM PRN agitation only  KEPT lorazepam 0.5 mg TID catatonia  OK to hold for sedation; would like her to get at least one-two doses a day (w/d risk)  Mom informed of these changes.   -- prior propranolol d/c 03/2022, atropine dc 01/2022, haldol dc sometime in 2023  ## Medical Decision Making Capacity:  Patient has interim legal guardian, mother [name] [phone]) as she is incompetent.   ## Further Work-up:  -- most recent EKG on 02/08/2023  had QtC of 432  in setting of tachycardia -- last head imaging 10/3 with pronounced cerebral atrophy.  -- adding on lipid profile, A1c for quality measures on antipsychotic. CBC yesterday normal.   -- Pertinent labwork reviewed earlier  this admission includes:  Baseline GFR decreased (now seems to be about 1.3) Ammonia most recently WNL  ## Disposition:  --Recommend memory care unit if possible but defer to SW  ## Behavioral / Environmental:  --  delirium precautions     ##Legal Status Patient with legal guardian  Thank you for this consult request. Recommendations have been communicated to the primary team.   Psychiatry will continue to follow.  Quynh Basso A Lakeyia Surber   NEW history  Relevant Aspects of Hospital Course:  Admitted on 01/22/2023 to normalize circadian rhythm after decompensating in >1 mo ED stay. Last received PRN antipsychotics 11/25. Had gotten PRN hydroxyzine about once a day.   Patient Report:  Unable to obtain report as patient is nonverbal.  She was lying upside down and diagonal in bed.She was fairly rigid in all extremities. She briefly winced in pain when I was examining her (elbow made a popping sound while examining ROM) however otherwise did not interact. Per sitter she has not interacted all bed although did become agitated overnight.   Spoke to mother briefly on 9/30 -  she consented to further taper of olanzapine. Will be by bedside around lunch tomorrow.    Collateral information: Called mom/guardain [name] who was fairly upset haldol was restarted even as PRN and had no recollection of hydroxyzine PRN. I tried to emphasize that these medications  are at low doses and she had not gotten any PRN antipsychotics.  I also added haldol to allergy lsit.   Psychiatric History:  Information collected from chart - pt unable to provide details  Bipolar affective disorder with psychotic features, MNCD Admitted to [facility] since Sep 06, 2021  Family psych history: Patient reports mental illness runs in her father's side of the family.   Social History:  Tobacco use: Half PPD; no use since admission Alcohol use: No use since admission Drug use: Denies; UDS negative on admission in  May  Family History:  No pertinent FHx  Medical History: Past Medical History:  Diagnosis Date   Bipolar 1 disorder with moderate mania (HCC) 12/30/2013   Hypertension     Surgical History: History reviewed. No pertinent surgical history.  Medications:   Current Facility-Administered Medications:    (feeding supplement) PROSource Plus liquid 30 mL, 30 mL, Oral, BID BM, Synetta Fail, MD, 30 mL at 03/19/23 1050   acetaminophen (TYLENOL) tablet 650 mg, 650 mg, Oral, Q6H PRN **OR** acetaminophen (TYLENOL) suppository 650 mg, 650 mg, Rectal, Q6H PRN, Synetta Fail, MD   acetaminophen (TYLENOL) tablet 1,000 mg, 1,000 mg, Oral, TID, Synetta Fail, MD, 1,000 mg at 03/19/23 1050   dextrose 5 % solution, , Intravenous, Continuous, Marinda Elk, MD, Last Rate: 75 mL/hr at 03/20/23 1058, New Bag at 03/20/23 1058   enoxaparin (LOVENOX) injection 30 mg, 30 mg, Subcutaneous, Q24H, Synetta Fail, MD, 30 mg at 03/19/23 1952   feeding supplement (ENSURE ENLIVE / ENSURE PLUS) liquid 237 mL, 237 mL, Oral, BID BM, Synetta Fail, MD, 237 mL at 03/19/23 1050   haloperidol lactate (HALDOL) injection 2 mg, 2 mg, Intramuscular, Q6H PRN, Dahal, Binaya, MD, 2 mg at 03/17/23 0113   hydrOXYzine (ATARAX) tablet 25 mg, 25 mg, Oral, TID PRN, Synetta Fail, MD, 25 mg at 03/19/23 1952   lactulose (CHRONULAC) 10 GM/15ML solution 20 g, 20 g, Oral, BID PRN, Synetta Fail, MD   LORazepam (ATIVAN) 2 MG/ML concentrated solution 0.5 mg, 0.5 mg, Oral, TID, Marinda Elk, MD   multivitamin with minerals tablet 1 tablet, 1 tablet, Oral, Daily, Synetta Fail, MD, 1 tablet at 03/19/23 1049   OLANZapine (ZYPREXA) tablet 2.5 mg, 2.5 mg, Oral, QHS, Synetta Fail, MD, 2.5 mg at 03/19/23 1952   OLANZapine (ZYPREXA) tablet 2.5 mg, 2.5 mg, Oral, Q6H PRN, Synetta Fail, MD, 2.5 mg at 03/17/23 0446   sodium chloride flush (NS) 0.9 % injection 3 mL, 3 mL,  Intravenous, Q12H, Synetta Fail, MD, 3 mL at 03/19/23 1050  Allergies: Allergies  Allergen Reactions   Penicillins Itching       Objective  Vital signs:  Temp:  [97.6 F (36.4 C)-98 F (36.7 C)] 97.6 F (36.4 C) (11/28 0444) Pulse Rate:  [56-118] 56 (11/28 0444) Resp:  [16-19] 16 (11/28 0444) BP: (101-113)/(66-75) 105/66 (11/28 0444) SpO2:  [98 %-100 %] 98 % (11/28 0444) Weight:  [47.4 kg] 47.4 kg (11/28 0444)  Mental Status Exam: Jamaal Abeln a 57 year old AA female, in her bed. Upside down and diagonal. She appeared slightly younger than his stated age and appears malnourished. During the interview, she was observed to have virtually no spontaneous movement. She did not open her eyes. She briefly winced as if in pain.  She did not attempt to vocalize; still nonverbal today. Impossible to judge insight/judgment/attention/orientation but assume lacking/poor. She did not appear to be responding to internal  stimuli.    Physical Exam: Physical Exam Vitals reviewed.  Constitutional:      General: [name] not in acute distress.    Appearance: [name] not toxic-appearing.     Comments: thin  HENT:     Head: Normocephalic and atraumatic.  Pulmonary:     Effort: Pulmonary effort is normal.  Neurological:     General: No focal deficit present.     Blood pressure 105/66, pulse (!) 56, temperature 97.6 F (36.4 C), temperature source Axillary, resp. rate 16, height 5\' 6"  (1.676 m), weight 47.4 kg, SpO2 98%. Body mass index is 16.87 kg/m.

## 2023-03-20 NOTE — Progress Notes (Signed)
IVT consult placed for new peripheral access due to previous IV leaking. Patient is alert however not able to hold still for proper assessment. Primary RN to administer ativan due to increasing agitation and will place new IVT consult.   Jackquline Branca Loyola Mast, RN

## 2023-03-21 DIAGNOSIS — F3110 Bipolar disorder, current episode manic without psychotic features, unspecified: Secondary | ICD-10-CM | POA: Diagnosis not present

## 2023-03-21 LAB — BASIC METABOLIC PANEL
Anion gap: 7 (ref 5–15)
BUN: 24 mg/dL — ABNORMAL HIGH (ref 6–20)
CO2: 26 mmol/L (ref 22–32)
Calcium: 8.7 mg/dL — ABNORMAL LOW (ref 8.9–10.3)
Chloride: 116 mmol/L — ABNORMAL HIGH (ref 98–111)
Creatinine, Ser: 1.46 mg/dL — ABNORMAL HIGH (ref 0.44–1.00)
GFR, Estimated: 42 mL/min — ABNORMAL LOW (ref >60)
Glucose, Bld: 100 mg/dL — ABNORMAL HIGH (ref 70–99)
Potassium: 4.4 mmol/L (ref 3.5–5.1)
Sodium: 149 mmol/L — ABNORMAL HIGH (ref 135–145)

## 2023-03-21 MED ORDER — LORAZEPAM 2 MG/ML IJ SOLN
0.5000 mg | Freq: Three times a day (TID) | INTRAMUSCULAR | Status: DC | PRN
  Filled 2023-03-21: qty 1

## 2023-03-21 MED ORDER — DEXTROSE 5 % IV SOLN: INTRAVENOUS | Status: DC

## 2023-03-21 MED ORDER — SODIUM CHLORIDE 0.9 % IV SOLN: INTRAVENOUS | Status: DC

## 2023-03-21 MED ORDER — BANATROL TF EN LIQD
60.0000 mL | Freq: Two times a day (BID) | ENTERAL | Status: DC
  Administered 2023-03-21 – 2023-03-31 (×11): 60 mL via ORAL
  Filled 2023-03-21 (×15): qty 60

## 2023-03-21 MED ORDER — LORAZEPAM 2 MG/ML PO CONC
0.5000 mg | Freq: Three times a day (TID) | ORAL | Status: DC | PRN
  Administered 2023-03-25 – 2023-03-27 (×4): 0.5 mg via ORAL
  Filled 2023-03-21 (×3): qty 1

## 2023-03-21 NOTE — Progress Notes (Signed)
11/28 [id number]  DO Howerter Notified by this RN "Pt here for early onset dementia, Na today was 151, pt was on D5 drip, PIV leaked and removed. IV team consult came and unable to place PIV due to pt unable to stay still and very agitated, pt very agiated oral ativan on mar but held today due to pt was somnolent, bp 142/68 hr 82 unable to get O2 due to pt moving too much, ok to give ativan concentrated PO ordered for 2200h now?" . 2049h MD stated "yes, it's okay to give the 2200h ativan dose now.  [id number]: scheduled 2200h oral concentrated ativan administered .11/29 0345h: IV TEAM came to place PIV on pt at this time, pt started to get agitated again and pulling away, I was advised by IV team to "administer prn PO ativan again". Pt have TID PRN order for PO concentrated ativan, I checked VS and bp reads 87/55 map 66 and hr 66 at this time will NOT administer PRN ativan.  0400h: DO stated "okay, thanks for these updates. will hold on placement of PIV for now and no interventions at this time for the BP".

## 2023-03-21 NOTE — Plan of Care (Signed)
  Problem: Clinical Measurements: Goal: Will remain free from infection Outcome: Progressing Goal: Diagnostic test results will improve Outcome: Progressing Goal: Respiratory complications will improve Outcome: Progressing Goal: Cardiovascular complication will be avoided Outcome: Progressing   Problem: Nutrition: Goal: Adequate nutrition will be maintained Outcome: Progressing   Problem: Coping: Goal: Level of anxiety will decrease Outcome: Progressing   Problem: Elimination: Goal: Will not experience complications related to urinary retention Outcome: Progressing   Problem: Pain Management: Goal: General experience of comfort will improve Outcome: Progressing

## 2023-03-21 NOTE — Plan of Care (Signed)
?  Problem: Clinical Measurements: ?Goal: Respiratory complications will improve ?Outcome: Progressing ?Goal: Cardiovascular complication will be avoided ?Outcome: Progressing ?  ?Problem: Safety: ?Goal: Ability to remain free from injury will improve ?Outcome: Progressing ?  ?

## 2023-03-21 NOTE — Consult Note (Signed)
[facility] Psychiatry Followup Face-to-Face Psychiatric Evaluation   Name: Natalie Brown DOB: 1965-07-18 MRN: [id number] Service Date: March 21, 2023 LOS:  LOS: 8 days  Reason for Consult: sedation Referring Provider: None - no consult order placed   Assessment  Natalie Brown is a 57 y.o. female admitted medically for 01/22/2023  3:31 PM for possible catatonia/altered mental status. She carries the psychiatric diagnoses of bipolar affective disorder with psychotic symptoms and has a past medical history of hypertension. This is the first time the psychiatry team has seen her this hotpitalization.   This patient has had an unfortunate, rapid decline over the year+. She was initially admitted to the behavioral health hospital after decompensating with hx bipolar I disorder (was sleeping outside in the rain, confused, issues remaining on task) in 08/2021. Her psychosis and mania stabilized after about 4 weeks of admission w/ multiple medication trials (Zyprexa, haldol, and Invega). This admission revealed a significant and rapidly progressive dementia as despite improvement in psychiatric symptoms, she never returned to baseline. She had a prolonged admission to behavioral health due to no safe discharge options and was transferred to the hospital in 12/2021 after 4 months after developing delirium/encephalopathy while admitted to [facility]. She was admitted to [facility] from 12/2021 to 01/2023; was discharged ot a care facility and came back to the ED within <24 hours due to agitation. Over the past year, she has had periods of becoming almost totally nonverbal with staff (more likely to interact with family) however has not been overtly manic or psychotic during that time span; dementia dx and sx have largely superceded psychiatric clinical features. Psychiatry consult team has been involved intermittently in her care over the last year (saw at admission 12/2021 when [facility] regimen continued, in  06/2022, in 10/2022, in 12/2022 and a few times in the ED in 02/2023. Briefly, changes made by psychiatry team included tapering off of haloperidol d/t rigidity/dystonia - attempted to transition to paliperidone however mother/guardian did not pick up the phone and eventual diagnosis of comorbid catatonia an starting lorazepam with good clinical response (began walking, talking, eating). She had previously regressed with increase of olanzapine which was tapered off; at some point was restarted on 2.5 mg at bedtime. Her psychiatric medications at time of consult are listed below. We are not charging for this encounter as no consult order placed.   On evaluation 11/29 pt showed mild improvement. She was awake and aware of examiner. She was hesitant to allow physical exam - at first quite rigid (clearly volitional) but after about the same as yesterday. Only allowed exam of one of her upper extremities. Continue to recommend medical w/u of AMS in demented pt such as CXR, u/a, etc.  Feel risk >>>> benefit of formal ativan trial at this time (encephalopathy due to medical cause or just progression of terminal dementia  remains most likely driver of AMS). On chart review seems to be more interactive and has started eating again; hopefully sodium will self-regulate. Discussed low BP w/ primary. Feel benefits of ativan still outweigh risks (do not need w/d). Cut PRN IM to 0.5 mg.   PLEASE CALL MOTHER PRIOR TO ALL NON-EMERGENT MED CHANGES Psychiatry team is following; will see 1-2x/week.  Current sx moslty 2/2 dementia. Further management per primary.  Diagnoses:  Active Hospital problems: Principal Problem:   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) Active Problems:   Bipolar 1 disorder (HCC)   Sinus tachycardia   Essential hypertension  Agitation   Failure to thrive in adult   Hypoalbuminemia   Palliative care by specialist   Delirium     Plan  ## Safety and Observation Level:  - Based  on my clinical evaluation, I estimate the patient to be at moderate risk of self harm in the current setting of safety unawareness; defer management to primary team   - pt in lap belt and mittens at time of eval.   ## Medications:  Continue with lorazepam as monotherpay (not ideal in dementia, but pt w/ hx of catatonia and multiple adverse events w/ neuroleptics.   Continue  lorazepam 0.5 mg TID catatonia  OK to hold for sedation; would like her to get at least one-two doses a day (w/d risk) Continue lorazepam 0.5 mg TID PRN anxiety/agitation (prior catatonia) Continue  lorazepam 1 mg IM PRN agitation only ---> changed to 0.5 mg  Mom informed of these changes.   -- prior propranolol d/c 03/2022, atropine dc 01/2022, haldol dc sometime in 2023 and again 03/20/2023, zyprexa dc 03/20/23  ## Medical Decision Making Capacity:  Patient has interim legal guardian, mother [name] [phone]) as she is incompetent.   ## Further Work-up:  -- most recent EKG on 02/08/2023  had QtC of 432  in setting of tachycardia -- last head imaging 10/3 with pronounced cerebral atrophy.  -- lipid/A1c don't seem to have run, however, now off antipsychotics.   -- Pertinent labwork reviewed earlier this admission includes:  Baseline GFR decreased (now seems to be about 1.3). Hypernatremia/  Ammonia most recently WNL  ## Disposition:  --Recommend memory care unit if possible but defer to SW  ## Behavioral / Environmental:  --  delirium precautions     ##Legal Status Patient with legal guardian  Thank you for this consult request. Recommendations have been communicated to the primary team.   Psychiatry will continue to follow.  Breeana Sawtelle A Delrose Rohwer   NEW history  Relevant Aspects of Hospital Course:  Admitted on 01/22/2023 to normalize circadian rhythm after decompensating in >1 mo ED stay. Last received PRN antipsychotics 11/25. Had gotten PRN hydroxyzine about once a day.   Patient Report:   Unable to obtain report as patient is nonverbal. She was lying in her bed with her legs curled under her. Some volitional rigidity; once this relaxed rigidity about the same as yesterday. Scooted away from me in bed and was reluctant to allow physical exam.    Collateral information: Called mom/guardain [name] who was fairly upset haldol was restarted even as PRN and had no recollection of hydroxyzine PRN. I tried to emphasize that these medications are at low doses and she had not gotten any PRN antipsychotics.  I also added haldol to allergy lsit.   Psychiatric History:  Information collected from chart - pt unable to provide details  Bipolar affective disorder with psychotic features, MNCD Admitted to [facility] since Sep 06, 2021  Family psych history: Patient reports mental illness runs in her father's side of the family.   Social History:  Tobacco use: Half PPD; no use since admission Alcohol use: No use since admission Drug use: Denies; UDS negative on admission in May  Family History:  No pertinent FHx  Medical History: Past Medical History:  Diagnosis Date   Bipolar 1 disorder with moderate mania (HCC) 12/30/2013   Hypertension     Surgical History: History reviewed. No pertinent surgical history.  Medications:   Current Facility-Administered Medications:    (feeding supplement) PROSource Plus liquid  30 mL, 30 mL, Oral, BID BM, Synetta Fail, MD, 30 mL at 03/21/23 0858   acetaminophen (TYLENOL) tablet 650 mg, 650 mg, Oral, Q6H PRN **OR** acetaminophen (TYLENOL) suppository 650 mg, 650 mg, Rectal, Q6H PRN, Synetta Fail, MD   acetaminophen (TYLENOL) tablet 1,000 mg, 1,000 mg, Oral, TID, Synetta Fail, MD, 1,000 mg at 03/21/23 0859   dextrose 5 % solution, , Intravenous, Continuous, Marinda Elk, MD   enoxaparin (LOVENOX) injection 30 mg, 30 mg, Subcutaneous, Q24H, Synetta Fail, MD, 30 mg at 03/19/23 1952   feeding supplement (ENSURE  ENLIVE / ENSURE PLUS) liquid 237 mL, 237 mL, Oral, BID BM, Synetta Fail, MD, 237 mL at 03/19/23 1050   fiber supplement (BANATROL TF) liquid 60 mL, 60 mL, Oral, BID, Marinda Elk, MD   lactulose (CHRONULAC) 10 GM/15ML solution 20 g, 20 g, Oral, BID PRN, Synetta Fail, MD   LORazepam (ATIVAN) 2 MG/ML concentrated solution 0.5 mg, 0.5 mg, Oral, TID, Marinda Elk, MD, 0.5 mg at 03/21/23 0900   LORazepam (ATIVAN) 2 MG/ML concentrated solution 0.5 mg, 0.5 mg, Oral, TID PRN **OR** LORazepam (ATIVAN) injection 1 mg, 1 mg, Intravenous, TID PRN, Greysen Swanton A   multivitamin with minerals tablet 1 tablet, 1 tablet, Oral, Daily, Synetta Fail, MD, 1 tablet at 03/21/23 0859   sodium chloride flush (NS) 0.9 % injection 3 mL, 3 mL, Intravenous, Q12H, Synetta Fail, MD, 3 mL at 03/19/23 1050  Allergies: Allergies  Allergen Reactions   Haloperidol And Related     Dystonia, catatonia.    Penicillins Itching       Objective  Vital signs:  Temp:  [97.7 F (36.5 C)] 97.7 F (36.5 C) (11/29 1125) Pulse Rate:  [66-86] 86 (11/29 1125) Resp:  [17-20] 20 (11/29 1125) BP: (84-142)/(55-68) 84/55 (11/29 1125) SpO2:  [94 %] 94 % (11/29 1125)  Mental Status Exam: Tanecia Highsmith a 57 year old AA female, in her bed with legs contracted under her. She appeared slightly younger than his stated age and appears malnourished. During the interview, she was observed to move herself around in bed. Eyes open but not tracking examiner. Reluctant to cooperate in physical exam and did not follow commands.  She did not attempt to vocalize; still nonverbal today. Impossible to judge insight/judgment/attention/orientation but assume lacking/poor. She did not appear to be responding to internal stimuli.    Physical Exam: Physical Exam Vitals reviewed.  Constitutional:      General: [name] not in acute distress.    Appearance: [name] not toxic-appearing.     Comments: thin   HENT:     Head: Normocephalic and atraumatic.  Pulmonary:     Effort: Pulmonary effort is normal.  Neurological:     General: No focal deficit present.    Blood pressure (!) 84/55, pulse 86, temperature 97.7 F (36.5 C), temperature source Oral, resp. rate 20, height 5\' 6"  (1.676 m), weight 47.4 kg, SpO2 94%. Body mass index is 16.87 kg/m.

## 2023-03-21 NOTE — Progress Notes (Addendum)
[facility] PROGRESS NOTE    Progress Note  Natalie Brown  JXB:[id number] DOB: 1966-02-16 DOA: 01/22/2023 PCP: Lavinia Sharps, NP     Brief Narrative:   Natalie Brown is an 57 y.o. female history significant for early onset dementia, bipolar disorder, catatonia essential hypertension sinus tachycardia B12 deficiency severe protein caloric malnutrition he was in the hospital for more than 400 days discharged on 01/22/2023 only to come back to the ED after 49 days due to agitation.  While in the ED evaluated by psychiatry who made changes to his medication.  Patient is currently a DNR/DNI patient was admitted to try to improve patient environment, circadian rhythm and provide a window which can help improve the patient psychiatric status.  Palliative care is also on board.   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 Continue Zyprexa Ativan hydroxyzine and Haldol as needed. Palliative care is following she is currently a DNR/DNI. Agitation has resolved. Patient unlikely to improve back to baseline per neurology. Further management per psychiatry. Will need placement at a rehab facility.  TOC is involved awaiting Medicaid application for long-term placement. Patient appears overly sedated, will reconsult psychiatry to reevaluate medication.  Hypovolemic hypernatremia: Currently on D5, sodium is improving continue D5. Basic metabolic panels pending this morning.  Weight loss: Continue diet and iron supplementation.  Sinus tachycardia Likely due to agitation and improved plus minus dehydration. Now improved.  Essential hypertension Keep the patient off antihypertensive medication. She has a small female with a BMI less than 17 her blood pressure will be low. During these episodes of so-called hypotension her heart rate has remained 60s to 70s which is reassuring.  Protein  caloric malnutrition moderate: Continue diet Ensure 3 times daily.    DVT prophylaxis: lovenox Family Communication:none Status is: Inpatient Remains inpatient appropriate because: Acute agitation.    Code Status:     Code Status Orders  (From admission, onward)           Start     Ordered   03/13/23 1329  Do not attempt resuscitation (DNR)- Limited -Do Not Intubate (DNI)  Continuous       Question Answer Comment  If pulseless and not breathing No CPR or chest compressions.   In Pre-Arrest Conditions (Patient Is Breathing and Has A Pulse) Do not intubate. Provide all appropriate non-invasive medical interventions. Avoid ICU transfer unless indicated or required.   Consent: Discussion documented in EHR or advanced directives reviewed      03/13/23 1330           Code Status History     Date Active Date Inactive Code Status Order ID Comments User Context   03/13/2023 1020 03/13/2023 1330 Limited: Do not attempt resuscitation (DNR) -DNR-LIMITED -Do Not Intubate/DNI  [id number]  Barbara Cower, NP ED   03/12/2023 1250 03/13/2023 1020 Do not attempt resuscitation (DNR) - Comfort care [id number]  Barbara Cower, NP ED   12/18/2022 1559 01/22/2023 1531 Do not attempt resuscitation (DNR) - Comfort care [id number]  Canary Brim, NP Inpatient   03/24/2022 1001 12/18/2022 1559 Full Code [id number]  Leroy Sea, MD Inpatient   12/24/2021 2334 12/26/2021 0159 Full Code [id number]  Orland Mustard, MD ED   12/24/2021 1935 12/24/2021 2334 Full Code [id number]  Orland Mustard, MD ED   12/24/2021 1908 12/24/2021 1935 Full Code [id number]  Orland Mustard, MD Inpatient   09/06/2021 2224 12/24/2021 1908 Full Code [id number]  [name], NP Inpatient   09/06/2021 2224 09/06/2021 2224 Full Code [id number]  [name], NP Inpatient   09/05/2021 1528 09/06/2021 2140 Full Code [id number]  Gwyneth Sprout, MD ED   05/26/2020 1636 05/29/2020 1818 Full Code [id number]  Loletta Parish, NP ED          IV Access:   Peripheral IV   Procedures and diagnostic studies:   No results found.   Medical Consultants:   None.   Subjective:    MIKIALA LAMM nonverbal  Objective:    Vitals:   03/20/23 2029 03/20/23 2033 03/21/23 0349 03/21/23 0604  BP: (!) 142/68 (!) 142/68 (!) 87/55 (!) 88/57  Pulse: 82 82 66 75  Resp:  17    Temp:      TempSrc:      SpO2:      Weight:      Height:       SpO2: 98 %   Intake/Output Summary (Last 24 hours) at 03/21/2023 1021 Last data filed at 03/21/2023 0400 Gross per 24 hour  Intake 660.11 ml  Output --  Net 660.11 ml   Filed Weights   03/16/23 0516 03/18/23 0500 03/20/23 0444  Weight: 43.5 kg 43.4 kg 47.4 kg    Exam: General exam: In no acute distress. Respiratory system: Good air movement and clear to auscultation. Cardiovascular system: S1 & S2 heard, RRR. No JVD. Gastrointestinal system: Abdomen is nondistended, soft and nontender.  Extremities: No pedal edema. Skin: No rashes, lesions or ulcers Psychiatry: No judgment or insight of medical condition.  Data Reviewed:    Labs: Basic Metabolic Panel: Recent Labs  Lab 03/16/23 0525 03/19/23 0449 03/20/23 0855  NA 154* 156* 151*  K 4.1 4.2 3.9  CL 117* 126* 117*  CO2 26 25 24   GLUCOSE 81 114* 84  BUN 33* 41* 29*  CREATININE 1.63* 1.36* 1.30*  CALCIUM 9.2 8.6* 8.5*   GFR Estimated Creatinine Clearance: 36.6 mL/min (A) (by C-G formula based on SCr of 1.3 mg/dL (H)). Liver Function Tests: No results for input(s): "AST", "ALT", "ALKPHOS", "BILITOT", "PROT", "ALBUMIN" in the last 168 hours.  No results for input(s): "LIPASE", "AMYLASE" in the last 168 hours. No results for input(s): "AMMONIA" in the last 168 hours. Coagulation profile No results for input(s): "INR", "PROTIME" in the last 168 hours. COVID-19 Labs  No results for input(s): "DDIMER", "FERRITIN", "LDH", "CRP" in the last 72 hours.  Lab Results  Component Value Date   SARSCOV2NAA  NEGATIVE 01/22/2023   SARSCOV2NAA NEGATIVE 05/14/2022   SARSCOV2NAA POSITIVE (A) 01/12/2022   SARSCOV2NAA NEGATIVE 12/24/2021    CBC: Recent Labs  Lab 03/19/23 0449  WBC 4.9  NEUTROABS 2.6  HGB 13.1  HCT 41.6  MCV 91.6  PLT 257   Cardiac Enzymes: No results for input(s): "CKTOTAL", "CKMB", "CKMBINDEX", "TROPONINI" in the last 168 hours. BNP (last 3 results) No results for input(s): "PROBNP" in the last 8760 hours. CBG: No results for input(s): "GLUCAP" in the last 168 hours. D-Dimer: No results for input(s): "DDIMER" in the last 72 hours. Hgb A1c: No results for input(s): "HGBA1C" in the last 72 hours. Lipid Profile: No results for input(s): "CHOL", "HDL", "LDLCALC", "TRIG", "CHOLHDL", "LDLDIRECT" in the last 72 hours. Thyroid function studies: No results for input(s): "TSH", "T4TOTAL", "T3FREE", "THYROIDAB" in the last 72 hours.  Invalid input(s): "FREET3" Anemia work up: No results for input(s): "VITAMINB12", "FOLATE", "FERRITIN", "TIBC", "IRON", "RETICCTPCT" in the last 72 hours. Sepsis Labs: Recent Labs  Lab 03/19/23 0449  WBC 4.9   Microbiology No results found for this or any previous visit (from the past 240 hour(s)).   Medications:    (feeding supplement) PROSource Plus  30 mL Oral BID BM   acetaminophen  1,000 mg Oral TID   enoxaparin (LOVENOX) injection  30 mg Subcutaneous Q24H   feeding supplement  237 mL Oral BID BM   LORazepam  0.5 mg Oral TID   multivitamin with minerals  1 tablet Oral Daily   sodium chloride flush  3 mL Intravenous Q12H   Continuous Infusions:      LOS: 8 days   Marinda Elk  [facility]  03/21/2023, 10:21 AM

## 2023-03-21 NOTE — Progress Notes (Signed)
Nutrition Follow-up  DOCUMENTATION CODES:   Severe malnutrition in context of chronic illness, Underweight  INTERVENTION:   -Continue regular diet and promote PO intake  - Continue Ensure Plus High Protein po BID, each supplement provides 350 kcal and 20 grams of protein. (Pt only likes strawberry) - Continue Prosource Plus BID and MVI with minerals daily -Continue Magic cup TID with meals, each supplement provides 290 kcal and 9 grams of protein - Provide Banatrol 60 mL BID  NUTRITION DIAGNOSIS:   Severe Malnutrition related to chronic illness as evidenced by severe fat depletion, severe muscle depletion, energy intake < or equal to 50% for > or equal to 5 days.  - Ongoing, but being addressed by oral nutritional supplements and increased PO intake  GOAL:   Patient will meet greater than or equal to 90% of their needs  - Progressing   MONITOR:   PO intake, Supplement acceptance, Weight trends  REASON FOR ASSESSMENT:   Consult Assessment of nutrition requirement/status  ASSESSMENT:   57 y.o. F, that has been with [facility] since 12-24-21. Has been in ED or 49 days. Was admitted to [facility] 03/13/2023 for early onset dementia with behavioral disturbance. PMH; HTN, bipolar 1 disorder with mania, catatonia, dementia.  No sitter or family present. Pt not able to answer questions.  On visit pt had consumed 90% of her Breakfast. No meals recorded from 11/23-11/28. Spoke to LPN who is unsure of how much she consumed yesterday because of shift change.  She did state pt did not drink any Ensures yesterday because they had no Strawberry flavor on the floor, pt only likes strawberry.  Per Meds history pt has been receiving Ensures twice a day, but unsure if she drank them. Of note, pt has not had any Ensures from 11/27-11/28 due to risk of aspiration-Meds history.    Per weight documentation her weight has been fluctuating from 90-105 lbs since admission. Overall her weight is trending  down.  She has lost 55 lbs, 34% in 6 months. Pt has been having loose bowel movements, will add Banatrol BID to help.   Noted hypovolemic hypernatremia, sodium trending down, MD continuing D5.   Admit weight: 46 kg Current weight: 47.4 kg    03/20/23 47.4 kg  01/21/23 46 kg  12/24/21 72 kg  09/05/21 72.6 kg   Average Meal Intake: 11/22-11/29: 92% intake x 2 recorded meals  Nutritionally Relevant Medications: Scheduled Meds:  (feeding supplement) PROSource Plus  30 mL Oral BID BM   multivitamin with minerals  1 tablet Oral Daily   Labs Reviewed: Sodium 151, Chloride 117, BUN 29, Creatinine 1.3, Calcium 8.5,   Diet Order:   Diet Order             Diet regular Room service appropriate? Yes; Fluid consistency: Thin  Diet effective now                   EDUCATION NEEDS:   Not appropriate for education at this time  Skin:  Skin Assessment: Reviewed RN Assessment  Last BM:  03/20/23: type 7  Height:   Ht Readings from Last 1 Encounters:  01/22/23 5\' 6"  (1.676 m)    Weight:   Wt Readings from Last 1 Encounters:  03/20/23 47.4 kg    Ideal Body Weight:  59.1 kg  BMI:  Body mass index is 16.87 kg/m.  Estimated Nutritional Needs:   Kcal:  1600-1800  Protein:  60-80 gm  Fluid:  >1.6 L  Elliot Dally, RD Registered Dietitian  See Amion for more information

## 2023-03-21 NOTE — Progress Notes (Signed)
   Palliative Medicine Inpatient Follow Up Note HPI: Natalie Brown is a 57 y.o. female with PMH significant for early onset dementia, bipolar affective disorder, catatonia, hypertension, sinus tachycardia, vitamin B12 deficiency, CKD, severe protein calorie malnutrition. Palliative care is involved to support additional goals of care conversations.   Today's Discussion 03/21/2023  *Please note that this is a verbal dictation therefore any spelling or grammatical errors are due to the "[employer]agon [employer] One" system interpretation.  Chart reviewed inclusive of vital signs, progress notes, laboratory results, and diagnostic images.   I met at bedside with Natalie Brown this afternoon. She was able to open her eyes when I called her name though she was not verbal with me.   Patients mother, Natalie Brown was at bedside. We spent time talking about the shock of her daughters health state. She shares disbelief of her daughters dementia and how significant it is. She expresses frustration that Natalie Brown was kept in the ED for so long and feels that restraining her as well as the length of time she was in her room there greatly contributed to how she is presenting now.  Natalie Brown goes on to share with me the burden of being diagnosed with lung cancer. She shares that the nodule was present in February of last year and that it was missed for over a year and a half. She feels that it's egregious she has been left in her present situation without knowledge of her cancer for over a year. Natalie Brown has a lack of trust for the health system.  I offered support through therapeutic listening.   We reviewed the plan for Natalie Brown to continue current care measures while trying to find her a safe disposition from here.   Questions and concerns addressed/Palliative Support Provided.   Objective Assessment: Vital Signs Vitals:   03/21/23 0604 03/21/23 1125  BP: (!) 88/57 (!) 84/55  Pulse: 75 86  Resp:  20  Temp:  97.7 F (36.5 C)   SpO2:  94%    Intake/Output Summary (Last 24 hours) at 03/21/2023 1410 Last data filed at 03/21/2023 0930 Gross per 24 hour  Intake 760.11 ml  Output --  Net 760.11 ml   Last Weight  Most recent update: 03/20/2023  6:30 AM    Weight  47.4 kg (104 lb 8 oz)            Gen: Middle aged AA F in NAD HEENT: Dry mucous membranes CV: Regular rate and rhythm  PULM:  On RA, breathing is even and nonlabored ABD: soft/nontender  EXT: muscle wasting Neuro: Sleeping  SUMMARY OF RECOMMENDATIONS   DNAR/DNI  Appreciate TOC involvement for Medicaid application and long term placement  PMT will remain peripheral at this time   Time: 8 ______________________________________________________________________________________ Lamarr Lulas [place] Palliative Medicine Team Team Cell Phone: [phone] Please utilize secure chat with additional questions, if there is no response within 30 minutes please call the above phone number  Palliative Medicine Team providers are available by phone from 7am to 7pm daily and can be reached through the team cell phone.  Should this patient require assistance outside of these hours, please call the patient's attending physician.

## 2023-03-21 NOTE — Plan of Care (Signed)

## 2023-03-22 LAB — BASIC METABOLIC PANEL
Anion gap: 2 — ABNORMAL LOW (ref 5–15)
BUN: 19 mg/dL (ref 6–20)
CO2: 25 mmol/L (ref 22–32)
Calcium: 8.1 mg/dL — ABNORMAL LOW (ref 8.9–10.3)
Chloride: 115 mmol/L — ABNORMAL HIGH (ref 98–111)
Creatinine, Ser: 1.1 mg/dL — ABNORMAL HIGH (ref 0.44–1.00)
GFR, Estimated: 59 mL/min — ABNORMAL LOW (ref >60)
Glucose, Bld: 110 mg/dL — ABNORMAL HIGH (ref 70–99)
Potassium: 3.5 mmol/L (ref 3.5–5.1)
Sodium: 142 mmol/L (ref 135–145)

## 2023-03-22 MED ORDER — DEXTROSE 5 % IV SOLN: INTRAVENOUS | Status: AC

## 2023-03-22 NOTE — Progress Notes (Signed)
MD made aware of low bp reading of 81/57.

## 2023-03-22 NOTE — Progress Notes (Signed)
[facility] PROGRESS NOTE    Progress Note  Natalie Brown  YQM:[id number] DOB: Dec 20, 1965 DOA: 01/22/2023 PCP: Lavinia Sharps, NP     Brief Narrative:   Natalie Brown is an 57 y.o. female history significant for early onset dementia, bipolar disorder, catatonia essential hypertension sinus tachycardia B12 deficiency severe protein caloric malnutrition he was in the hospital for more than 400 days discharged on 01/22/2023 only to come back to the ED after 49 days due to agitation.  While in the ED evaluated by psychiatry who made changes to his medication.  Patient is currently a DNR/DNI patient was admitted to try to improve patient environment, circadian rhythm and provide a window which can help improve the patient psychiatric status.  Palliative care is also on board.   Assessment/Plan:   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)/bipolar disorder/possible catatonia Seen by neurology no further recommendations and psychiatric consulted currently titrating medications. All of her psychiatric medications have been held, she has not been eating. Palliative care is following she is currently a DNR/DNI. Agitation has resolved. Patient unlikely to improve back to baseline per neurology. Further management per psychiatry. Will need placement at a rehab facility.  TOC is involved awaiting Medicaid application for long-term placement. Patient appears to be catatonic versus overly sedated. Patient is awaiting placement.  Hypovolemic hypernatremia: Sodium has improved continue D5 for 1 additional day.  Chloride still elevated. Likely due to decreased oral intake in the setting of oversedation.  Weight loss: Continue diet and iron supplementation.  Sinus tachycardia Likely due to agitation and improved plus minus dehydration. Now improved.  Essential hypertension Keep the patient off antihypertensive medication. She has a small female with a BMI less than 17  her blood pressure will be low. During these episodes of so-called hypotension her heart rate has remained 60s to 70s which is reassuring.  Protein caloric malnutrition moderate: Continue diet Ensure 3 times daily.    DVT prophylaxis: lovenox Family Communication:none Status is: Inpatient Remains inpatient appropriate because: Acute agitation.    Code Status:     Code Status Orders  (From admission, onward)           Start     Ordered   03/13/23 1329  Do not attempt resuscitation (DNR)- Limited -Do Not Intubate (DNI)  Continuous       Question Answer Comment  If pulseless and not breathing No CPR or chest compressions.   In Pre-Arrest Conditions (Patient Is Breathing and Has A Pulse) Do not intubate. Provide all appropriate non-invasive medical interventions. Avoid ICU transfer unless indicated or required.   Consent: Discussion documented in EHR or advanced directives reviewed      03/13/23 1330           Code Status History     Date Active Date Inactive Code Status Order ID Comments User Context   03/13/2023 1020 03/13/2023 1330 Limited: Do not attempt resuscitation (DNR) -DNR-LIMITED -Do Not Intubate/DNI  [id number]  Barbara Cower, NP ED   03/12/2023 1250 03/13/2023 1020 Do not attempt resuscitation (DNR) - Comfort care [id number]  Barbara Cower, NP ED   12/18/2022 1559 01/22/2023 1531 Do not attempt resuscitation (DNR) - Comfort care [id number]  Canary Brim, NP Inpatient   03/24/2022 1001 12/18/2022 1559 Full Code [id number]  Leroy Sea, MD Inpatient   12/24/2021 2334 12/26/2021 0159 Full Code [id number]  Orland Mustard, MD ED   12/24/2021 1935 12/24/2021 2334 Full Code [id number]  Orland Mustard, MD  ED   12/24/2021 1908 12/24/2021 1935 Full Code [id number]  Orland Mustard, MD Inpatient   09/06/2021 2224 12/24/2021 1908 Full Code [id number]  [name], NP Inpatient   09/06/2021 2224 09/06/2021 2224 Full Code [id number]  [name], NP Inpatient   09/05/2021 1528  09/06/2021 2140 Full Code [id number]  Gwyneth Sprout, MD ED   05/26/2020 1636 05/29/2020 1818 Full Code [id number]  Leevy-Johnson, Lina Sar, NP ED         IV Access:   Peripheral IV   Procedures and diagnostic studies:   No results found.   Medical Consultants:   None.   Subjective:    Natalie Brown nonverbal today.  Objective:    Vitals:   03/21/23 2230 03/22/23 0627 03/22/23 0700 03/22/23 0900  BP: 98/61 (!) 71/60 (!) 82/61 (!) 81/57  Pulse: 86 61 60 74  Resp:   14   Temp: 97.7 F (36.5 C) (!) 97.3 F (36.3 C) (!) 97.4 F (36.3 C)   TempSrc: Oral Oral Axillary   SpO2:  100% 100%   Weight:  45.5 kg    Height:       SpO2: 100 %  No intake or output data in the 24 hours ending 03/22/23 0958  Filed Weights   03/18/23 0500 03/20/23 0444 03/22/23 0627  Weight: 43.4 kg 47.4 kg 45.5 kg    Exam: General exam: In no acute distress. Respiratory system: Good air movement and clear to auscultation. Cardiovascular system: S1 & S2 heard, RRR. No JVD. Gastrointestinal system: Abdomen is nondistended, soft and nontender.  Extremities: No pedal edema. Skin: No rashes, lesions or ulcers Psychiatry: No judgment or insight in medical condition, she is very sleepy  Data Reviewed:    Labs: Basic Metabolic Panel: Recent Labs  Lab 03/16/23 0525 03/19/23 0449 03/20/23 0855 03/21/23 1555 03/22/23 0432  NA 154* 156* 151* 149* 142  K 4.1 4.2 3.9 4.4 3.5  CL 117* 126* 117* 116* 115*  CO2 26 25 24 26 25   GLUCOSE 81 114* 84 100* 110*  BUN 33* 41* 29* 24* 19  CREATININE 1.63* 1.36* 1.30* 1.46* 1.10*  CALCIUM 9.2 8.6* 8.5* 8.7* 8.1*   GFR Estimated Creatinine Clearance: 41.5 mL/min (A) (by C-G formula based on SCr of 1.1 mg/dL (H)). Liver Function Tests: No results for input(s): "AST", "ALT", "ALKPHOS", "BILITOT", "PROT", "ALBUMIN" in the last 168 hours.  No results for input(s): "LIPASE", "AMYLASE" in the last 168 hours. No results for input(s): "AMMONIA" in  the last 168 hours. Coagulation profile No results for input(s): "INR", "PROTIME" in the last 168 hours. COVID-19 Labs  No results for input(s): "DDIMER", "FERRITIN", "LDH", "CRP" in the last 72 hours.  Lab Results  Component Value Date   SARSCOV2NAA NEGATIVE 01/22/2023   SARSCOV2NAA NEGATIVE 05/14/2022   SARSCOV2NAA POSITIVE (A) 01/12/2022   SARSCOV2NAA NEGATIVE 12/24/2021    CBC: Recent Labs  Lab 03/19/23 0449  WBC 4.9  NEUTROABS 2.6  HGB 13.1  HCT 41.6  MCV 91.6  PLT 257   Cardiac Enzymes: No results for input(s): "CKTOTAL", "CKMB", "CKMBINDEX", "TROPONINI" in the last 168 hours. BNP (last 3 results) No results for input(s): "PROBNP" in the last 8760 hours. CBG: No results for input(s): "GLUCAP" in the last 168 hours. D-Dimer: No results for input(s): "DDIMER" in the last 72 hours. Hgb A1c: No results for input(s): "HGBA1C" in the last 72 hours. Lipid Profile: No results for input(s): "CHOL", "HDL", "LDLCALC", "TRIG", "CHOLHDL", "LDLDIRECT" in the last 72 hours.  Thyroid function studies: No results for input(s): "TSH", "T4TOTAL", "T3FREE", "THYROIDAB" in the last 72 hours.  Invalid input(s): "FREET3" Anemia work up: No results for input(s): "VITAMINB12", "FOLATE", "FERRITIN", "TIBC", "IRON", "RETICCTPCT" in the last 72 hours. Sepsis Labs: Recent Labs  Lab 03/19/23 0449  WBC 4.9   Microbiology No results found for this or any previous visit (from the past 240 hour(s)).   Medications:    (feeding supplement) PROSource Plus  30 mL Oral BID BM   acetaminophen  1,000 mg Oral TID   enoxaparin (LOVENOX) injection  30 mg Subcutaneous Q24H   feeding supplement  237 mL Oral BID BM   fiber supplement (BANATROL TF)  60 mL Oral BID   LORazepam  0.5 mg Oral TID   multivitamin with minerals  1 tablet Oral Daily   sodium chloride flush  3 mL Intravenous Q12H   Continuous Infusions:  dextrose 100 mL/hr at 03/21/23 1839       LOS: 9 days   Marinda Elk  [facility]  03/22/2023, 9:58 AM

## 2023-03-23 NOTE — Progress Notes (Signed)
[facility] PROGRESS NOTE    Progress Note  Natalie Brown  GEX:[id number] DOB: 12-21-1965 DOA: 01/22/2023 PCP: Lavinia Sharps, NP     Brief Narrative:   Natalie Brown is an 57 y.o. female history significant for early onset dementia, bipolar disorder, catatonia essential hypertension sinus tachycardia B12 deficiency severe protein caloric malnutrition he was in the hospital for more than 400 days discharged on 01/22/2023 only to come back to the ED after 49 days due to agitation.  While in the ED evaluated by psychiatry who made changes to his medication.  Patient is currently a DNR/DNI patient was admitted to try to improve patient environment, circadian rhythm and provide a window which can help improve the patient psychiatric status.  Palliative care is also on board.   Assessment/Plan:   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)/bipolar disorder/possible catatonia Seen by neurology no further recommendations and psychiatric consulted currently titrating medications. All of her psychiatric medications have been held, she has not been eating. Palliative care is following she is currently a DNR/DNI. Agitation has resolved. Patient unlikely to improve back to baseline per neurology. Further management per psychiatry. Will need placement at a rehab facility.  TOC is involved awaiting Medicaid application for long-term placement. Patient appears to be catatonic versus overly sedated. Patient is awaiting placement.  Hypovolemic hypernatremia: Sodium has improved continue D5 for 1 additional day.  Chloride still elevated. Likely due to decreased oral intake in the setting of oversedation.  Weight loss: Continue diet and iron supplementation.  Sinus tachycardia Likely due to agitation and improved plus minus dehydration. Now improved.  Essential hypertension Keep the patient off antihypertensive medication. She has a small female with a BMI less than 17  her blood pressure will be low. During these episodes of so-called hypotension her heart rate has remained 60s to 70s which is reassuring.  Protein caloric malnutrition moderate: Continue diet Ensure 3 times daily.    DVT prophylaxis: lovenox Family Communication:none Status is: Inpatient Remains inpatient appropriate because: Acute agitation.    Code Status:     Code Status Orders  (From admission, onward)           Start     Ordered   03/13/23 1329  Do not attempt resuscitation (DNR)- Limited -Do Not Intubate (DNI)  Continuous       Question Answer Comment  If pulseless and not breathing No CPR or chest compressions.   In Pre-Arrest Conditions (Patient Is Breathing and Has A Pulse) Do not intubate. Provide all appropriate non-invasive medical interventions. Avoid ICU transfer unless indicated or required.   Consent: Discussion documented in EHR or advanced directives reviewed      03/13/23 1330           Code Status History     Date Active Date Inactive Code Status Order ID Comments User Context   03/13/2023 1020 03/13/2023 1330 Limited: Do not attempt resuscitation (DNR) -DNR-LIMITED -Do Not Intubate/DNI  [id number]  Barbara Cower, NP ED   03/12/2023 1250 03/13/2023 1020 Do not attempt resuscitation (DNR) - Comfort care [id number]  Barbara Cower, NP ED   12/18/2022 1559 01/22/2023 1531 Do not attempt resuscitation (DNR) - Comfort care [id number]  Canary Brim, NP Inpatient   03/24/2022 1001 12/18/2022 1559 Full Code [id number]  Leroy Sea, MD Inpatient   12/24/2021 2334 12/26/2021 0159 Full Code [id number]  Orland Mustard, MD ED   12/24/2021 1935 12/24/2021 2334 Full Code [id number]  Orland Mustard, MD  ED   12/24/2021 1908 12/24/2021 1935 Full Code [id number]  Orland Mustard, MD Inpatient   09/06/2021 2224 12/24/2021 1908 Full Code [id number]  [name], NP Inpatient   09/06/2021 2224 09/06/2021 2224 Full Code [id number]  [name], NP Inpatient   09/05/2021 1528  09/06/2021 2140 Full Code [id number]  Gwyneth Sprout, MD ED   05/26/2020 1636 05/29/2020 1818 Full Code [id number]  Leevy-Johnson, Lina Sar, NP ED         IV Access:   Peripheral IV   Procedures and diagnostic studies:   No results found.   Medical Consultants:   None.   Subjective:    Natalie Brown nonverbal today.  Objective:    Vitals:   03/22/23 0900 03/22/23 1921 03/23/23 0344 03/23/23 0346  BP: (!) 81/57 (!) 109/90 (!) 83/52   Pulse: 74 81 60   Resp:  15 15   Temp:  (!) 97.5 F (36.4 C) 97.6 F (36.4 C)   TempSrc:  Axillary Axillary   SpO2:  100% 100%   Weight:    47 kg  Height:       SpO2: 100 %   Intake/Output Summary (Last 24 hours) at 03/23/2023 1104 Last data filed at 03/23/2023 0330 Gross per 24 hour  Intake 1293 ml  Output --  Net 1293 ml    Filed Weights   03/20/23 0444 03/22/23 0627 03/23/23 0346  Weight: 47.4 kg 45.5 kg 47 kg    Exam: General exam: In no acute distress. Respiratory system: Good air movement and clear to auscultation. Cardiovascular system: S1 & S2 heard, RRR. No JVD. Gastrointestinal system: Abdomen is nondistended, soft and nontender.  Extremities: No pedal edema. Skin: No rashes, lesions or ulcers Psychiatry: No judgment or insight in medical condition, she is very sleepy  Data Reviewed:    Labs: Basic Metabolic Panel: Recent Labs  Lab 03/19/23 0449 03/20/23 0855 03/21/23 1555 03/22/23 0432  NA 156* 151* 149* 142  K 4.2 3.9 4.4 3.5  CL 126* 117* 116* 115*  CO2 25 24 26 25   GLUCOSE 114* 84 100* 110*  BUN 41* 29* 24* 19  CREATININE 1.36* 1.30* 1.46* 1.10*  CALCIUM 8.6* 8.5* 8.7* 8.1*   GFR Estimated Creatinine Clearance: 42.9 mL/min (A) (by C-G formula based on SCr of 1.1 mg/dL (H)). Liver Function Tests: No results for input(s): "AST", "ALT", "ALKPHOS", "BILITOT", "PROT", "ALBUMIN" in the last 168 hours.  No results for input(s): "LIPASE", "AMYLASE" in the last 168 hours. No results for  input(s): "AMMONIA" in the last 168 hours. Coagulation profile No results for input(s): "INR", "PROTIME" in the last 168 hours. COVID-19 Labs  No results for input(s): "DDIMER", "FERRITIN", "LDH", "CRP" in the last 72 hours.  Lab Results  Component Value Date   SARSCOV2NAA NEGATIVE 01/22/2023   SARSCOV2NAA NEGATIVE 05/14/2022   SARSCOV2NAA POSITIVE (A) 01/12/2022   SARSCOV2NAA NEGATIVE 12/24/2021    CBC: Recent Labs  Lab 03/19/23 0449  WBC 4.9  NEUTROABS 2.6  HGB 13.1  HCT 41.6  MCV 91.6  PLT 257   Cardiac Enzymes: No results for input(s): "CKTOTAL", "CKMB", "CKMBINDEX", "TROPONINI" in the last 168 hours. BNP (last 3 results) No results for input(s): "PROBNP" in the last 8760 hours. CBG: No results for input(s): "GLUCAP" in the last 168 hours. D-Dimer: No results for input(s): "DDIMER" in the last 72 hours. Hgb A1c: No results for input(s): "HGBA1C" in the last 72 hours. Lipid Profile: No results for input(s): "CHOL", "HDL", "LDLCALC", "TRIG", "CHOLHDL", "  LDLDIRECT" in the last 72 hours. Thyroid function studies: No results for input(s): "TSH", "T4TOTAL", "T3FREE", "THYROIDAB" in the last 72 hours.  Invalid input(s): "FREET3" Anemia work up: No results for input(s): "VITAMINB12", "FOLATE", "FERRITIN", "TIBC", "IRON", "RETICCTPCT" in the last 72 hours. Sepsis Labs: Recent Labs  Lab 03/19/23 0449  WBC 4.9   Microbiology No results found for this or any previous visit (from the past 240 hour(s)).   Medications:    (feeding supplement) PROSource Plus  30 mL Oral BID BM   acetaminophen  1,000 mg Oral TID   enoxaparin (LOVENOX) injection  30 mg Subcutaneous Q24H   feeding supplement  237 mL Oral BID BM   fiber supplement (BANATROL TF)  60 mL Oral BID   LORazepam  0.5 mg Oral TID   multivitamin with minerals  1 tablet Oral Daily   sodium chloride flush  3 mL Intravenous Q12H   Continuous Infusions:       LOS: 10 days   Marinda Elk  [facility]  03/23/2023, 11:04 AM

## 2023-03-24 NOTE — Progress Notes (Addendum)
   Palliative Medicine Inpatient Follow Up Note HPI: Natalie Brown is a 57 y.o. female with PMH significant for early onset dementia, bipolar affective disorder, catatonia, hypertension, sinus tachycardia, vitamin B12 deficiency, CKD, severe protein calorie malnutrition. Palliative care is involved to support additional goals of care conversations.   Today's Discussion 03/24/2023  *Please note that this is a verbal dictation therefore any spelling or grammatical errors are due to the "Dragon Medical One" system interpretation.  Chart reviewed inclusive of vital signs, progress notes, laboratory results, and diagnostic images.   I met with [name] this morning. [name] noted to be on the other side of the bed in a fetal position. [name] not verbal with me though opens here eyes.  [name] is eating little and not maintaining hydration.  I called and had a detailed conversation with [name]'s mother about her poor health state. I shared my worry in the setting of patients progressive dementia. We reviewed that [name] is in very poor health. I offered support for [name] in all that she has been through. I shared openly and honestly that I worry we are likely encroaching the end phases of [name]'s disease process. I shared the progressive nature of dementia and what the final phases look like. [name] keeps asking if I am trying to "kill" her daughter. I stated that this it not my goal, rather I want to support she and her daughter throughout this difficult phase of her disease.   I explained hospice, and the goals and philosophies associated with it.  I shared it would be very reasonable to consider this as an option as [name] has an incurable progressive disease. I shared artificial nutrition in patients like [name] is not proven to prolong quality of life and can have more downfalls than benefits.   We agreed to meet tomorrow at 12:30 for further conversations regarding patient health state.  Questions  and concerns addressed/Palliative Support Provided.   Objective Assessment: Vital Signs Vitals:   03/24/23 0652 03/24/23 1349  BP: (!) 90/56 98/64  Pulse: 73 78  Resp:    Temp:    SpO2: 100%     Intake/Output Summary (Last 24 hours) at 03/24/2023 1609 Last data filed at 03/24/2023 1103 Gross per 24 hour  Intake 6 ml  Output --  Net 6 ml   Last Weight  Most recent update: 03/24/2023  5:33 AM    Weight  43.5 kg (95 lb 14.4 oz)            Gen: Middle aged AA F in NAD HEENT: Dry mucous membranes CV: Regular rate and rhythm  PULM:  On RA, breathing is even and nonlabored ABD: soft/nontender  EXT: muscle wasting Neuro: Opens eyes though not responsive  SUMMARY OF RECOMMENDATIONS   DNAR/DNI  Discussed with patients mother Hospice as a consideration  Plan for family meeting tomorrow at 12:30  Appreciate TOC involvement for Medicaid application and long term placement  PMT will remain peripheral at this time   Time: 45 ______________________________________________________________________________________ Lamarr Lulas [place] Palliative Medicine Team Team Cell Phone: [phone] Please utilize secure chat with additional questions, if there is no response within 30 minutes please call the above phone number  Palliative Medicine Team providers are available by phone from 7am to 7pm daily and can be reached through the team cell phone.  Should this patient require assistance outside of these hours, please call the patient's attending physician.

## 2023-03-24 NOTE — Progress Notes (Signed)
[facility] PROGRESS NOTE    Progress Note  Natalie Brown  BJY:[id number] DOB: January 12, 1966 DOA: 01/22/2023 PCP: Lavinia Sharps, NP     Brief Narrative:   Natalie Brown is an 57 y.o. female history significant for early onset dementia, bipolar disorder, catatonia essential hypertension sinus tachycardia B12 deficiency severe protein caloric malnutrition he was in the hospital for more than 400 days discharged on 01/22/2023 only to come back to the ED after 49 days due to agitation.  While in the ED evaluated by psychiatry who made changes to his medication.  Patient is currently a DNR/DNI patient was admitted to try to improve patient environment, circadian rhythm and provide a window which can help improve the patient psychiatric status.  Palliative care is also on board.   Assessment/Plan:   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)/bipolar disorder/possible catatonia Seen by neurology no further recommendations and psychiatric consulted currently titrating medications. All of her psychiatric medications have been held, she has not been eating. Palliative care is following she is currently a DNR/DNI. Agitation has resolved. Patient unlikely to improve back to baseline per neurology. Further management per psychiatry. Will need placement at a rehab facility.  TOC is involved awaiting Medicaid application for long-term placement. Patient appears to be catatonic versus overly sedated. Patient is awaiting placement.  Hypovolemic hypernatremia: No labs last few days, will re-order, may need more fluids  Weight loss: Malnutrition Po remains poor, was 72 kg in 2023, 47 kg last month, now 43.5 kg on most recent weight - consider placement of PEG feeding tube if consistent with family's goals of care  Sinus tachycardia Likely due to agitation and improved plus minus dehydration. Now improved.  Essential hypertension Keep the patient off antihypertensive  medication. She has a small female with a BMI less than 17 her blood pressure will be low. During these episodes of so-called hypotension her heart rate has remained 60s to 70s which is reassuring.    DVT prophylaxis: lovenox Family Communication:none Status is: Inpatient Remains inpatient appropriate because: placement issues    Code Status:     Code Status Orders  (From admission, onward)           Start     Ordered   03/13/23 1329  Do not attempt resuscitation (DNR)- Limited -Do Not Intubate (DNI)  Continuous       Question Answer Comment  If pulseless and not breathing No CPR or chest compressions.   In Pre-Arrest Conditions (Patient Is Breathing and Has A Pulse) Do not intubate. Provide all appropriate non-invasive medical interventions. Avoid ICU transfer unless indicated or required.   Consent: Discussion documented in EHR or advanced directives reviewed      03/13/23 1330           Code Status History     Date Active Date Inactive Code Status Order ID Comments User Context   03/13/2023 1020 03/13/2023 1330 Limited: Do not attempt resuscitation (DNR) -DNR-LIMITED -Do Not Intubate/DNI  [id number]  Barbara Cower, NP ED   03/12/2023 1250 03/13/2023 1020 Do not attempt resuscitation (DNR) - Comfort care [id number]  Barbara Cower, NP ED   12/18/2022 1559 01/22/2023 1531 Do not attempt resuscitation (DNR) - Comfort care [id number]  Canary Brim, NP Inpatient   03/24/2022 1001 12/18/2022 1559 Full Code [id number]  Leroy Sea, MD Inpatient   12/24/2021 2334 12/26/2021 0159 Full Code [id number]  Orland Mustard, MD ED   12/24/2021 1935 12/24/2021 2334 Full Code  [id number]  Orland Mustard, MD ED   12/24/2021 1908 12/24/2021 1935 Full Code [id number]  Orland Mustard, MD Inpatient   09/06/2021 2224 12/24/2021 1908 Full Code [id number]  [name], NP Inpatient   09/06/2021 2224 09/06/2021 2224 Full Code [id number]  [name], NP Inpatient   09/05/2021 1528 09/06/2021 2140 Full Code  [id number]  Gwyneth Sprout, MD ED   05/26/2020 1636 05/29/2020 1818 Full Code [id number]  Leevy-Johnson, Lina Sar, NP ED         IV Access:   Peripheral IV   Procedures and diagnostic studies:   No results found.   Medical Consultants:   None.   Subjective:    Natalie Brown nonverbal today.  Objective:    Vitals:   03/24/23 0530 03/24/23 0533 03/24/23 0652 03/24/23 1349  BP: (!) 90/53  (!) 90/56 98/64  Pulse: (!) 102  73 78  Resp: 18     Temp: 97.9 F (36.6 C)     TempSrc: Oral     SpO2: 99%  100%   Weight:  43.5 kg    Height:       SpO2: 100 %   Intake/Output Summary (Last 24 hours) at 03/24/2023 1458 Last data filed at 03/24/2023 1103 Gross per 24 hour  Intake 6 ml  Output --  Net 6 ml    Filed Weights   03/22/23 0627 03/23/23 0346 03/24/23 0533  Weight: 45.5 kg 47 kg 43.5 kg    Exam: General exam: In no acute distress. Respiratory system: Good air movement and clear to auscultation. Cardiovascular system: S1 & S2 heard, RRR  Gastrointestinal system: Abdomen is nondistended, soft and nontender.  Extremities: No pedal edema. Skin: No visible rashes, lesions or ulcers Psychiatry: does not interact  Data Reviewed:    Labs: Basic Metabolic Panel: Recent Labs  Lab 03/19/23 0449 03/20/23 0855 03/21/23 1555 03/22/23 0432  NA 156* 151* 149* 142  K 4.2 3.9 4.4 3.5  CL 126* 117* 116* 115*  CO2 25 24 26 25   GLUCOSE 114* 84 100* 110*  BUN 41* 29* 24* 19  CREATININE 1.36* 1.30* 1.46* 1.10*  CALCIUM 8.6* 8.5* 8.7* 8.1*   GFR Estimated Creatinine Clearance: 39.7 mL/min (A) (by C-G formula based on SCr of 1.1 mg/dL (H)). Liver Function Tests: No results for input(s): "AST", "ALT", "ALKPHOS", "BILITOT", "PROT", "ALBUMIN" in the last 168 hours.  No results for input(s): "LIPASE", "AMYLASE" in the last 168 hours. No results for input(s): "AMMONIA" in the last 168 hours. Coagulation profile No results for input(s): "INR", "PROTIME" in the  last 168 hours. COVID-19 Labs  No results for input(s): "DDIMER", "FERRITIN", "LDH", "CRP" in the last 72 hours.  Lab Results  Component Value Date   SARSCOV2NAA NEGATIVE 01/22/2023   SARSCOV2NAA NEGATIVE 05/14/2022   SARSCOV2NAA POSITIVE (A) 01/12/2022   SARSCOV2NAA NEGATIVE 12/24/2021    CBC: Recent Labs  Lab 03/19/23 0449  WBC 4.9  NEUTROABS 2.6  HGB 13.1  HCT 41.6  MCV 91.6  PLT 257   Cardiac Enzymes: No results for input(s): "CKTOTAL", "CKMB", "CKMBINDEX", "TROPONINI" in the last 168 hours. BNP (last 3 results) No results for input(s): "PROBNP" in the last 8760 hours. CBG: No results for input(s): "GLUCAP" in the last 168 hours. D-Dimer: No results for input(s): "DDIMER" in the last 72 hours. Hgb A1c: No results for input(s): "HGBA1C" in the last 72 hours. Lipid Profile: No results for input(s): "CHOL", "HDL", "LDLCALC", "TRIG", "CHOLHDL", "LDLDIRECT" in the last 72 hours. Thyroid  function studies: No results for input(s): "TSH", "T4TOTAL", "T3FREE", "THYROIDAB" in the last 72 hours.  Invalid input(s): "FREET3" Anemia work up: No results for input(s): "VITAMINB12", "FOLATE", "FERRITIN", "TIBC", "IRON", "RETICCTPCT" in the last 72 hours. Sepsis Labs: Recent Labs  Lab 03/19/23 0449  WBC 4.9   Microbiology No results found for this or any previous visit (from the past 240 hour(s)).   Medications:    (feeding supplement) PROSource Plus  30 mL Oral BID BM   acetaminophen  1,000 mg Oral TID   enoxaparin (LOVENOX) injection  30 mg Subcutaneous Q24H   feeding supplement  237 mL Oral BID BM   fiber supplement (BANATROL TF)  60 mL Oral BID   LORazepam  0.5 mg Oral TID   multivitamin with minerals  1 tablet Oral Daily   sodium chloride flush  3 mL Intravenous Q12H   Continuous Infusions:       LOS: 11 days   Silvano Bilis  [facility]  03/24/2023, 2:58 PM

## 2023-03-25 DIAGNOSIS — F3110 Bipolar disorder, current episode manic without psychotic features, unspecified: Secondary | ICD-10-CM | POA: Diagnosis not present

## 2023-03-25 LAB — MAGNESIUM: Magnesium: 2.3 mg/dL (ref 1.7–2.4)

## 2023-03-25 LAB — BASIC METABOLIC PANEL
Anion gap: 12 (ref 5–15)
BUN: 13 mg/dL (ref 6–20)
CO2: 24 mmol/L (ref 22–32)
Calcium: 9 mg/dL (ref 8.9–10.3)
Chloride: 109 mmol/L (ref 98–111)
Creatinine, Ser: 1.12 mg/dL — ABNORMAL HIGH (ref 0.44–1.00)
GFR, Estimated: 58 mL/min — ABNORMAL LOW (ref >60)
Glucose, Bld: 71 mg/dL (ref 70–99)
Potassium: 5 mmol/L (ref 3.5–5.1)
Sodium: 145 mmol/L (ref 135–145)

## 2023-03-25 LAB — PHOSPHORUS: Phosphorus: 3.4 mg/dL (ref 2.5–4.6)

## 2023-03-25 MED ORDER — GUAIFENESIN 100 MG/5ML PO LIQD: 5.0000 mL | ORAL | Status: DC | PRN

## 2023-03-25 MED ORDER — ONDANSETRON HCL 4 MG/2ML IJ SOLN: 4.0000 mg | Freq: Four times a day (QID) | INTRAMUSCULAR | Status: DC | PRN

## 2023-03-25 MED ORDER — METOPROLOL TARTRATE 5 MG/5ML IV SOLN: 5.0000 mg | INTRAVENOUS | Status: DC | PRN

## 2023-03-25 MED ORDER — HYDRALAZINE HCL 20 MG/ML IJ SOLN: 10.0000 mg | INTRAMUSCULAR | Status: DC | PRN

## 2023-03-25 MED ORDER — TRAZODONE HCL 50 MG PO TABS
50.0000 mg | ORAL_TABLET | Freq: Every evening | ORAL | Status: DC | PRN
  Administered 2023-03-28 – 2023-11-16 (×8): 50 mg via ORAL
  Filled 2023-03-25 (×9): qty 1

## 2023-03-25 MED ORDER — LORAZEPAM 2 MG/ML PO CONC
1.0000 mg | Freq: Once | ORAL | Status: AC
  Administered 2023-03-25: 1 mg via ORAL
  Filled 2023-03-25: qty 1

## 2023-03-25 MED ORDER — SENNOSIDES-DOCUSATE SODIUM 8.6-50 MG PO TABS: 1.0000 | ORAL_TABLET | Freq: Every evening | ORAL | Status: DC | PRN

## 2023-03-25 NOTE — Hospital Course (Addendum)
 57 year old female with PMH of bipolar disorder, early onset dementia who was initially admitted for failure to thrive . Patient has had a long and complicated hospitalization spanning 2 years. She was admitted in 2023 with increasingly erratic behavior and cognitive impairment. She was evaluated by psychiatry and neurology but no treatable or reversible cause of her impairment was found. Her hospital course was complicated by weight loss, malnutrition, failure to thrive . She was briefly placed in a memory care unit in October 2024. Upon arriving at the facility, patient exhibited agitation so patient was denied entry and sent back to the hospital the same day. Patient has remained in the hospital since then. Patient is nonverbal and does not follow any commands. Patient can ambulate with assistance. Currently, the goal of care is comfort care. TOC team is following patient, and assisting with possible disposition

## 2023-03-25 NOTE — Progress Notes (Signed)
   Palliative Medicine Inpatient Follow Up Note HPI: Natalie Brown is a 57 y.o. female with PMH significant for early onset dementia, bipolar affective disorder, catatonia, hypertension, sinus tachycardia, vitamin B12 deficiency, CKD, severe protein calorie malnutrition. Palliative care is involved to support additional goals of care conversations.   Today's Discussion 03/25/2023  *Please note that this is a verbal dictation therefore any spelling or grammatical errors are due to the "Dragon Medical One" system interpretation.  Chart reviewed inclusive of vital signs, progress notes, laboratory results, and diagnostic images.   A family meeting was held this afternoon with Natalie Brown, Natalie Brown.   Providers present were Dr. Gasper Sells, Dr. Nelson Chimes, Kiva [place], MSW, Luan Pulling, RN, and myself.   Open and honest conversations were held in the setting of patient progressive dementia. We discussed that her brain size is about half the size it should be. We reviewed the prior scan was done in October and in the setting of patients presentation now it is clear that her dementia has worsened further. Reviewed what end stage dementia looks like. We uniformly agreed that patient is at the final stages of her disease process.   Natalie Brown expresses that she wants to know what to do. It was recommended strongly to consider allowing patient to be comfort and allowing hospice involvement. Patients Brown used terms like "letting her die". The psychiatry and primary medical team(s) tried as able to re-frame this thought. We reviewed the hope of allowing Natalie Brown to die a peaceful death without suffering. Reviewed that antibiotics, mIVF, artificial nutrition would not be utilized.    Patients Brown, Natalie Brown is reasonably upset about the encroaching loss of her daughter.  Discussions related to inpatient hospice were held. Patients Brown is agreeable to speaking to a hospice liaison about their  services.  Plan for psychiatry to increase ativan to 1mg  PO TID.  Questions and concerns addressed/Palliative Support Provided.   Objective Assessment: Vital Signs Vitals:   03/24/23 2031 03/24/23 2242  BP: (!) 157/106 117/66  Pulse: (!) 53 92  Resp: 18   Temp: 98.2 F (36.8 C)   SpO2: (!) 85%     Intake/Output Summary (Last 24 hours) at 03/25/2023 1319 Last data filed at 03/25/2023 1200 Gross per 24 hour  Intake 240 ml  Output --  Net 240 ml   Last Weight  Most recent update: 03/24/2023  5:33 AM    Weight  43.5 kg (95 lb 14.4 oz)            Gen: Middle aged AA F in NAD HEENT: Dry mucous membranes CV: Regular rate and rhythm  PULM:  On RA, breathing is even and nonlabored ABD: soft/nontender  EXT: muscle wasting Neuro: Opens eyes though not responsive  SUMMARY OF RECOMMENDATIONS   DNAR/DNI  Discussed with patients Brown Hospice as a consideration  Appreciate Hospice liaison meeting with patients Brown to discuss hospice services  PMT will remain peripheral at this time   Time: 60 ______________________________________________________________________________________ Lamarr Lulas [place] Palliative Medicine Team Team Cell Phone: [phone] Please utilize secure chat with additional questions, if there is no response within 30 minutes please call the above phone number  Palliative Medicine Team providers are available by phone from 7am to 7pm daily and can be reached through the team cell phone.  Should this patient require assistance outside of these hours, please call the patient's attending physician.

## 2023-03-25 NOTE — Consult Note (Signed)
[facility] Psychiatry Followup Face-to-Face Psychiatric Evaluation   Name: Natalie Brown DOB: 04-12-1966 MRN: [id number] Service Date: March 25, 2023 LOS:  LOS: 12 days  Reason for Consult: sedation Referring Provider: None - no consult order placed   Assessment  LATORIE VECELLIO is a 57 y.o. female admitted medically for 01/22/2023  3:31 PM for possible catatonia/altered mental status. She carries the psychiatric diagnoses of bipolar affective disorder with psychotic symptoms and has a past medical history of hypertension. This is the first time the psychiatry team has seen her this hotpitalization.   This patient has had an unfortunate, rapid decline over the year+. She was initially admitted to the behavioral health hospital after decompensating with hx bipolar I disorder (was sleeping outside in the rain, confused, issues remaining on task) in 08/2021. Her psychosis and mania stabilized after about 4 weeks of admission w/ multiple medication trials (Zyprexa, haldol, and Invega). This admission revealed a significant and rapidly progressive dementia as despite improvement in psychiatric symptoms, she never returned to baseline. She had a prolonged admission to behavioral health due to no safe discharge options and was transferred to the hospital in 12/2021 after 4 months after developing delirium/encephalopathy while admitted to [facility]. She was admitted to [facility] from 12/2021 to 01/2023; was discharged ot a care facility and came back to the ED within <24 hours due to agitation. Over the past year, she has had periods of becoming almost totally nonverbal with staff (more likely to interact with family) however has not been overtly manic or psychotic during that time span; dementia dx and sx have largely superceded psychiatric clinical features. Psychiatry consult team has been involved intermittently in her care over the last year (saw at admission 12/2021 when [facility] regimen continued, in  06/2022, in 10/2022, in 12/2022 and a few times in the ED in 02/2023. Briefly, changes made by psychiatry team included tapering off of haloperidol d/t rigidity/dystonia - attempted to transition to paliperidone however mother/guardian did not pick up the phone and eventual diagnosis of comorbid catatonia an starting lorazepam with good clinical response (began walking, talking, eating). She had previously regressed with increase of olanzapine which was tapered off; at some point was restarted on 2.5 mg at bedtime. Her psychiatric medications at time of consult are listed below. We are not charging for this encounter as no consult order placed.   On evaluation 11/29 pt showed mild improvement. She was awake and aware of examiner. She was hesitant to allow physical exam - at first quite rigid (clearly volitional) but after about the same as yesterday. Only allowed exam of one of her upper extremities. Continue to recommend medical w/u of AMS in demented pt such as CXR, u/a, etc.  Feel risk >>>> benefit of formal ativan trial at this time (encephalopathy due to medical cause or just progression of terminal dementia  remains most likely driver of AMS). On chart review seems to be more interactive and has started eating again; hopefully sodium will self-regulate. Discussed low BP w/ primary. Feel benefits of ativan still outweigh risks (do not need w/d).   On 12/3, participated in palliative care family meeting - mom open to hospice w/o life sustaining care. Pt given 1 addl mg oral ativan to test response - became more alert but not more interactive, overall poor response. Have asked nursing to reach out if there is tangible benefit like pt eating, vocalizing, etc after increased dose otherwise will leave at 0.5 TID.   PLEASE  CALL MOTHER PRIOR TO ALL NON-EMERGENT MED CHANGES Psychiatry team is following; will see 1-2x/week.  Current sx moslty 2/2 dementia. Further management per primary.  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   ## Medications:  Continue with lorazepam as monotherpay (not ideal in dementia, but pt w/ hx of catatonia and multiple adverse events w/ neuroleptics.   Continue  lorazepam 0.5 mg TID catatonia  OK to hold for sedation; would like her to get at least one-two doses a day (w/d risk) Continue lorazepam 0.5 mg TID PRN anxiety/agitation (prior catatonia) Continue  lorazepam 1 mg IM PRN agitation only ---> changed to 0.5 mg  Mom informed of these changes.   -- prior propranolol d/c 03/2022, atropine dc 01/2022, haldol dc sometime in 2023 and again 03/20/2023, zyprexa dc 03/20/23  ## Medical Decision Making Capacity:  Patient has interim legal guardian, mother Natalie Brown [phone]) as she is incompetent.   ## Further Work-up:  -- most recent EKG on 02/08/2023  had QtC of 432  in setting of tachycardia -- last head imaging 10/3 with pronounced cerebral atrophy.  -- lipid/A1c don't seem to have run, however, now off antipsychotics.   -- Pertinent labwork reviewed earlier this admission includes:  Baseline GFR decreased (now seems to be about 1.3). Hypernatremia/  Ammonia most recently WNL  ## Disposition:  --Recommend memory care unit if possible but defer to SW  ## Behavioral / Environmental:  --  delirium precautions     ##Legal Status Patient with legal guardian  Thank you for this consult request. Recommendations have been  communicated to the primary team.   Psychiatry will continue to follow.  Natalie Brown A Camari Wisham   NEW history  Relevant Aspects of Hospital Course:  Admitted on 01/22/2023 to normalize circadian rhythm after decompensating in >1 mo ED stay. Last received PRN antipsychotics 11/25. Had gotten PRN hydroxyzine about once a day.   Patient Report:  Unable to obtain report as patient is nonverbal. Prior to extra dose ativan, curled up at bottom of bed. After - more awake but not interactive at all, pulling away when I tried to examine, minimal change in muscle rigidity (negative response).    Collateral information: Called mom/guardain Natalie Brown who was fairly upset haldol was restarted even as PRN and had no recollection of hydroxyzine PRN. I tried to emphasize that these medications are at low doses and she had not gotten any PRN antipsychotics.  I also added haldol to allergy lsit.   Psychiatric History:  Information collected from chart - pt unable to provide details  Bipolar affective disorder with psychotic features, MNCD Admitted to [facility] since Sep 06, 2021  Family psych history: Patient reports mental illness runs in her father's side of the family.   Social History:  Tobacco use: Half PPD; no use since admission Alcohol use: No use since admission Drug use: Denies; UDS negative on admission in  May  Family History:  No pertinent FHx  Medical History: Past Medical History:  Diagnosis Date   Bipolar 1 disorder with moderate mania (HCC) 12/30/2013   Hypertension     Surgical History: History reviewed. No pertinent surgical history.  Medications:   Current Facility-Administered Medications:    (feeding supplement) PROSource Plus liquid 30 mL, 30 mL, Oral, BID BM, Synetta Fail, MD, 30 mL at 03/22/23 1056   acetaminophen (TYLENOL) tablet 650 mg, 650 mg, Oral, Q6H PRN **OR** acetaminophen (TYLENOL) suppository 650 mg, 650 mg, Rectal, Q6H PRN, Synetta Fail, MD    acetaminophen (TYLENOL) tablet 1,000 mg, 1,000 mg, Oral, TID, Synetta Fail, MD, 1,000 mg at 03/24/23 2140   enoxaparin (LOVENOX) injection 30 mg, 30 mg, Subcutaneous, Q24H, Synetta Fail, MD, 30 mg at 03/24/23 2138   feeding supplement (ENSURE ENLIVE / ENSURE PLUS) liquid 237 mL, 237 mL, Oral, BID BM, Synetta Fail, MD, 237 mL at 03/22/23 1531   fiber supplement (BANATROL TF) liquid 60 mL, 60 mL, Oral, BID, Marinda Elk, MD, 60 mL at 03/24/23 2137   guaiFENesin (ROBITUSSIN) 100 MG/5ML liquid 5 mL, 5 mL, Oral, Q4H PRN, Amin, Ankit C, MD   hydrALAZINE (APRESOLINE) injection 10 mg, 10 mg, Intravenous, Q4H PRN, Amin, Ankit C, MD   lactulose (CHRONULAC) 10 GM/15ML solution 20 g, 20 g, Oral, BID PRN, Synetta Fail, MD   LORazepam (ATIVAN) 2 MG/ML concentrated solution 0.5 mg, 0.5 mg, Oral, TID, Marinda Elk, MD, 0.5 mg at 03/25/23 1043   LORazepam (ATIVAN) 2 MG/ML concentrated solution 0.5 mg, 0.5 mg, Oral, TID PRN, 0.5 mg at 03/25/23 0200 **OR** LORazepam (ATIVAN) injection 0.5 mg, 0.5 mg, Intravenous, TID PRN, Carold Eisner A   metoprolol tartrate (LOPRESSOR) injection 5 mg, 5 mg, Intravenous, Q4H PRN, Amin, Ankit C, MD   multivitamin with minerals tablet 1 tablet, 1 tablet, Oral, Daily, Synetta Fail, MD, 1 tablet at 03/23/23 1546   ondansetron (ZOFRAN) injection 4 mg, 4 mg, Intravenous, Q6H PRN, Amin, Ankit C, MD   senna-docusate (Senokot-S) tablet 1 tablet, 1 tablet, Oral, QHS PRN, Amin, Ankit C, MD   sodium chloride flush (NS) 0.9 % injection 3 mL, 3 mL, Intravenous, Q12H, Synetta Fail, MD, 3 mL at 03/24/23 2141   traZODone (DESYREL) tablet 50 mg, 50 mg, Oral, QHS PRN, Amin, Ankit C, MD  Allergies: Allergies  Allergen Reactions   Haloperidol And Related     Dystonia, catatonia.    Penicillins Itching       Objective  Vital signs:  Temp:  [98 F (36.7 C)-98.2 F (36.8 C)] 98 F (36.7 C) (12/03 1649) Pulse Rate:  [53-96] 96  (12/03 1649) Resp:  [18-19] 19 (12/03 1649) BP: (92-157)/(59-106) 92/59 (12/03 1649) SpO2:  [85 %-98 %] 98 % (12/03 1649)  Mental Status Exam: Shalayla Swartwout a 58 year old AA female, in her bed with legs usually contracted under her. She appeared slightly younger than his stated age and appears extremely malnourished. During the interview, she was observed to occasionally move herself around in bed. Eyes open on later eval but not tracking examiner. Reluctant to cooperate in physical exam and did not follow commands.  She did not attempt to vocalize. Impossible to judge insight/judgment/attention/orientation but assume lacking/poor. She did not appear to be responding to internal stimuli.    Physical Exam: Physical Exam Vitals reviewed.  Constitutional:      General: [name] not in acute distress.    Appearance: She  is not toxic-appearing.     Comments: thin  HENT:     Head: Normocephalic and atraumatic.  Pulmonary:     Effort: Pulmonary effort is normal.  Neurological:     General: No focal deficit present.     Blood pressure (!) 92/59, pulse 96, temperature 98 F (36.7 C), temperature source Oral, resp. rate 19, height 5\' 6"  (1.676 m), weight 43.5 kg, SpO2 98%. Body mass index is 15.48 kg/m.

## 2023-03-25 NOTE — TOC Progression Note (Addendum)
Transition of Care [facility]) - Progression Note    Patient Details  Name: Natalie Brown MRN: [id number] Date of Birth: 1965/11/10  [facility] CM/SW Contact  Janae Bridgeman, RN Phone Number: 03/25/2023, 1:24 PM  Clinical Narrative:    CM met with the patient's mother with Medical Team present to discuss goals of care with her regarding the patient.  After Palliative Care Team spoke with the patient's mother at length and mother was given choice regarding Inpatient Hospice placement and the mother was in agreement to speak with [facility] Liaison regarding hospice philosophy.  The patient's mother is unable to care for the patient in the home and does not have any other family available for patient to return home with home hospice care.  I placed a referral with Glenna Fellows, RNCM with [facility] and he accepted the referral and plans to call the patient's mother this afternoon to discuss patient's care.  Patient's mother was in agreement during goals of care to provide comfort care measure/orders for the patient.  The patient is currently DNR at this time.  Emotional support was provided to the patient's mother during goals of care meeting today.  03/25/2023 1612 - CM included Haynes Bast, [facility] leadership was included in the referral for request for Inpatient Hospice placement.  Julien Girt, RNCM states that he left a voicemail for the patient's mother to follow up regarding hospice needs for the patient.  TOC Team will continue to follow the patient for TOC needs.        Expected Discharge Plan and Services                                               Social Determinants of Health (SDOH) Interventions SDOH Screenings   Food Insecurity: No Food Insecurity (01/24/2023)  Housing: Low Risk  (01/24/2023)  Transportation Needs: Patient Unable To Answer (01/24/2023)  Utilities: Not At Risk (01/24/2023)  Alcohol Screen: Low Risk  (09/06/2021)  Depression  (PHQ2-9): Medium Risk (05/26/2020)  Tobacco Use: High Risk (02/08/2023)    Readmission Risk Interventions     No data to display

## 2023-03-25 NOTE — Progress Notes (Signed)
PROGRESS NOTE    Natalie Brown  WUJ:[id number] DOB: March 11, 1966 DOA: 01/22/2023 PCP: Lavinia Sharps, NP    Brief Narrative:   57 y.o. female history significant for early onset dementia, bipolar disorder, catatonia essential hypertension sinus tachycardia B12 deficiency severe protein caloric malnutrition he was in the hospital for more than 400 days discharged on 01/22/2023 only to come back to the ED after 49 days due to agitation.  While in the ED evaluated by psychiatry who made changes to his medication.  Patient is currently a DNR/DNI patient was admitted to try to improve patient environment, circadian rhythm and provide a window which can help improve the patient psychiatric status.  Palliative care is also on board.    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  Further med management per psychiatry, currently on lorazepam 0.5 mg 3 times daily and additional as needed as necessary.  Palliative care team following, currently DNR/DNI.  TOC involved awaiting Medicaid application for long-term placement. Per neurology, unlikely to improve further.   Hypovolemic hypernatremia: Stable.  Periodic checks   Weight loss: Malnutrition Po remains poor, was 72 kg in 2023, 47 kg last month, now 43.5 kg on most recent weight - consider placement of PEG feeding tube if consistent with family's goals of care   Sinus tachycardia Improved   Essential hypertension Not on any meds.  IV as needed   Extensive palliative care meeting, mother considering hospice care.   DVT prophylaxis: lovenox Family  Communication:none Status is: Inpatient Remains inpatient appropriate because: placement issues    Subjective:  Patient seen and examined at bedside.  Poor oral intake Met with the patient's mother, palliative, psychiatry and TOC team.  Had extensive discussions regarding patient's goals of care and also gave opportunity to mother to ask all the questions.  At this time if you are in agreement that patient is likely best served under hospice care. Eventually mother in agreement to have discussion with hospice team.  Examination:  General exam: Appears calm and comfortable  Respiratory system: Clear to auscultation. Respiratory effort normal. Cardiovascular system: S1 & S2 heard, RRR. No JVD, murmurs, rubs, gallops or clicks. No pedal edema. Gastrointestinal system: Abdomen is nondistended, soft and nontender. No organomegaly or masses felt. Normal bowel sounds heard. Central nervous system: Alert and oriented. No focal neurological deficits. Extremities: Symmetric 5 x 5 power. Skin: No rashes, lesions or ulcers Psychiatry: Judgement and insight appear normal. Mood & affect appropriate.           Diet Orders (From admission, onward)     Start     Ordered   03/13/23 1330  Diet regular Room service appropriate? Yes; Fluid consistency: Thin  Diet effective now       Question Answer Comment  Room service appropriate? Yes   Fluid consistency: Thin      03/13/23 1330            Objective: Vitals:   03/24/23 0652 03/24/23 1349 03/24/23 2031 03/24/23 2242  BP: (!) 90/56 98/64 (!) 157/106 117/66  Pulse: 73 78 (!) 53 92  Resp:   18  Temp:   98.2 F (36.8 C)   TempSrc:   Oral   SpO2: 100%  (!) 85%   Weight:      Height:        Intake/Output Summary (Last 24 hours) at 03/25/2023 1332 Last data filed at 03/25/2023 1200 Gross per 24 hour  Intake 240 ml  Output --  Net 240 ml   Filed Weights   03/22/23 0627 03/23/23 0346 03/24/23 0533  Weight: 45.5 kg 47 kg 43.5 kg     Scheduled Meds:  (feeding supplement) PROSource Plus  30 mL Oral BID BM   acetaminophen  1,000 mg Oral TID   enoxaparin (LOVENOX) injection  30 mg Subcutaneous Q24H   feeding supplement  237 mL Oral BID BM   fiber supplement (BANATROL TF)  60 mL Oral BID   LORazepam  0.5 mg Oral TID   LORazepam  1 mg Oral Once   multivitamin with minerals  1 tablet Oral Daily   sodium chloride flush  3 mL Intravenous Q12H   Continuous Infusions:  Nutritional status Signs/Symptoms: severe fat depletion, severe muscle depletion, energy intake < or equal to 50% for > or equal to 5 days Interventions: Hormel Shake, Ensure Enlive (each supplement provides 350kcal and 20 grams of protein), Prostat Body mass index is 15.48 kg/m.  Data Reviewed:   CBC: Recent Labs  Lab 03/19/23 0449  WBC 4.9  NEUTROABS 2.6  HGB 13.1  HCT 41.6  MCV 91.6  PLT 257   Basic Metabolic Panel: Recent Labs  Lab 03/19/23 0449 03/20/23 0855 03/21/23 1555 03/22/23 0432 03/25/23 0603  NA 156* 151* 149* 142 145  K 4.2 3.9 4.4 3.5 5.0  CL 126* 117* 116* 115* 109  CO2 25 24 26 25 24   GLUCOSE 114* 84 100* 110* 71  BUN 41* 29* 24* 19 13  CREATININE 1.36* 1.30* 1.46* 1.10* 1.12*  CALCIUM 8.6* 8.5* 8.7* 8.1* 9.0  MG  --   --   --   --  2.3  PHOS  --   --   --   --  3.4   GFR: Estimated Creatinine Clearance: 39 mL/min (A) (by C-G formula based on SCr of 1.12 mg/dL (H)). Liver Function Tests: No results for input(s): "AST", "ALT", "ALKPHOS", "BILITOT", "PROT", "ALBUMIN" in the last 168 hours. No results for input(s): "LIPASE", "AMYLASE" in the last 168 hours. No results for input(s): "AMMONIA" in the last 168 hours. Coagulation Profile: No results for input(s): "INR", "PROTIME" in the last 168 hours. Cardiac Enzymes: No results for input(s): "CKTOTAL", "CKMB", "CKMBINDEX", "TROPONINI" in the last 168 hours. BNP (last 3 results) No results for input(s): "PROBNP" in the last 8760 hours. HbA1C: No results for  input(s): "HGBA1C" in the last 72 hours. CBG: No results for input(s): "GLUCAP" in the last 168 hours. Lipid Profile: No results for input(s): "CHOL", "HDL", "LDLCALC", "TRIG", "CHOLHDL", "LDLDIRECT" in the last 72 hours. Thyroid Function Tests: No results for input(s): "TSH", "T4TOTAL", "FREET4", "T3FREE", "THYROIDAB" in the last 72 hours. Anemia Panel: No results for input(s): "VITAMINB12", "FOLATE", "FERRITIN", "TIBC", "IRON", "RETICCTPCT" in the last 72 hours. Sepsis Labs: No results for input(s): "PROCALCITON", "LATICACIDVEN" in the last 168 hours.  No results found for this or any previous visit (from the past 240 hour(s)).       Radiology Studies: No results found.         LOS: 12 days   Time spent= 35 mins    Miguel Rota, MD [facility]  If 7PM-7AM, please contact night-coverage  03/25/2023, 1:32 PM

## 2023-03-25 NOTE — Plan of Care (Signed)
  Problem: Clinical Measurements: Goal: Ability to maintain clinical measurements within normal limits will improve Outcome: Progressing Goal: Will remain free from infection Outcome: Progressing Goal: Diagnostic test results will improve Outcome: Progressing Goal: Respiratory complications will improve Outcome: Progressing Goal: Cardiovascular complication will be avoided Outcome: Progressing   Problem: Elimination: Goal: Will not experience complications related to urinary retention Outcome: Progressing   Problem: Pain Management: Goal: General experience of comfort will improve Outcome: Progressing   Problem: Skin Integrity: Goal: Risk for impaired skin integrity will decrease Outcome: Progressing   Problem: Clinical Measurements: Goal: Ability to maintain clinical measurements within normal limits will improve Outcome: Progressing Goal: Will remain free from infection Outcome: Progressing Goal: Diagnostic test results will improve Outcome: Progressing Goal: Respiratory complications will improve Outcome: Progressing Goal: Cardiovascular complication will be avoided Outcome: Progressing   Problem: Elimination: Goal: Will not experience complications related to urinary retention Outcome: Progressing   Problem: Pain Management: Goal: General experience of comfort will improve Outcome: Progressing   Problem: Skin Integrity: Goal: Risk for impaired skin integrity will decrease Outcome: Progressing

## 2023-03-26 LAB — PHOSPHORUS: Phosphorus: 3 mg/dL (ref 2.5–4.6)

## 2023-03-26 LAB — CBC
HCT: 40.6 % (ref 36.0–46.0)
Hemoglobin: 13.5 g/dL (ref 12.0–15.0)
MCH: 29.4 pg (ref 26.0–34.0)
MCHC: 33.3 g/dL (ref 30.0–36.0)
MCV: 88.5 fL (ref 80.0–100.0)
Platelets: 288 10*3/uL (ref 150–400)
RBC: 4.59 MIL/uL (ref 3.87–5.11)
RDW: 13.2 % (ref 11.5–15.5)
WBC: 5.5 10*3/uL (ref 4.0–10.5)
nRBC: 0 % (ref 0.0–0.2)

## 2023-03-26 LAB — BASIC METABOLIC PANEL
Anion gap: 7 (ref 5–15)
BUN: 19 mg/dL (ref 6–20)
CO2: 24 mmol/L (ref 22–32)
Calcium: 8.7 mg/dL — ABNORMAL LOW (ref 8.9–10.3)
Chloride: 113 mmol/L — ABNORMAL HIGH (ref 98–111)
Creatinine, Ser: 1.26 mg/dL — ABNORMAL HIGH (ref 0.44–1.00)
GFR, Estimated: 50 mL/min — ABNORMAL LOW (ref >60)
Glucose, Bld: 89 mg/dL (ref 70–99)
Potassium: 4.6 mmol/L (ref 3.5–5.1)
Sodium: 144 mmol/L (ref 135–145)

## 2023-03-26 LAB — MAGNESIUM: Magnesium: 2.2 mg/dL (ref 1.7–2.4)

## 2023-03-26 NOTE — Progress Notes (Signed)
[id number] Liaison Note  Received request from Transitions of Care Manager to evaluate patient for inpatient hospice services. Patient visited and chart reviewed. [facility] leadership assisted in evaluation of this patient with complex situation.   Unfortunately Ms. Biondo is no appropriate for our inpatient hospice facility at this time.   [facility] is happy to provide OP Palliative or Hospice services in a long term care facility if indicated.   Also, if there are changes in this patient's condition we will gladly reevaluate for Inpatient Hospice Care.   Above information shared with Marcelino Duster, Transitions of Care Manager. Please call with any questions or concerns.  Thank you for the opportunity to participate in this patient's care.   Glenna Fellows BSN, Charity fundraiser, OCN [employer] [phone]

## 2023-03-26 NOTE — Progress Notes (Signed)
   Palliative Medicine Inpatient Follow Up Note HPI: Natalie Brown is a 57 y.o. female with PMH significant for early onset dementia, bipolar affective disorder, catatonia, hypertension, sinus tachycardia, vitamin B12 deficiency, CKD, severe protein calorie malnutrition. Palliative care is involved to support additional goals of care conversations.   Today's Discussion 03/26/2023  *Please note that this is a verbal dictation therefore any spelling or grammatical errors are due to the "[employer]agon [employer] One" system interpretation.  Chart reviewed inclusive of vital signs, progress notes, laboratory results, and diagnostic images.   I met with Natalie Brown at bedside this morning. [name] noted to be in a fetal position. [name] somewhat responsive though her word mimics are incomprehensible.   I spoke to nursing technician who has shares there have been no noticeable changes with the patient at this time.   Plan for [facility] to discuss patient case today, they have not yet been able to get in touch with patients mother.   Questions and concerns addressed/Palliative Support Provided.   Objective Assessment: Vital Signs Vitals:   03/26/23 0616 03/26/23 0724  BP: 108/63 (!) 87/60  Pulse: 96 86  Resp: 16 14  Temp: 99 F (37.2 C)   SpO2: 97% 100%    Intake/Output Summary (Last 24 hours) at 03/26/2023 0945 Last data filed at 03/26/2023 0255 Gross per 24 hour  Intake 240 ml  Output --  Net 240 ml   Last Weight  Most recent update: 03/26/2023  3:42 AM    Weight  44.3 kg (97 lb 10.6 oz)            Gen: Middle aged AA F in NAD HEENT: Dry mucous membranes CV: Regular rate and rhythm  PULM:  On RA, breathing is even and nonlabored ABD: soft/nontender  EXT: muscle wasting Neuro: Opens eyes though verbal mimic of words though not comprehenable  SUMMARY OF RECOMMENDATIONS   DNAR/DNI  Appreciate [facility] discussing patients case  PMT will  check in with patient weekly  Time:  25 ______________________________________________________________________________________ Natalie Brown  Palliative Medicine Team Team Cell Phone: [phone] Please utilize secure chat with additional questions, if there is no response within 30 minutes please call the above phone number  Palliative Medicine Team providers are available by phone from 7am to 7pm daily and can be reached through the team cell phone.  Should this patient require assistance outside of these hours, please call the patient's attending physician.

## 2023-03-26 NOTE — Plan of Care (Signed)

## 2023-03-26 NOTE — Progress Notes (Signed)
PROGRESS NOTE    Natalie Brown  WUJ:[id number] DOB: 05-28-1965 DOA: 01/22/2023 PCP: Lavinia Sharps, NP    Brief Narrative:   57 y.o. female history significant for early onset dementia, bipolar disorder, catatonia essential hypertension sinus tachycardia B12 deficiency severe protein caloric malnutrition he was in the hospital for more than 400 days discharged on 01/22/2023 only to come back to the ED after 49 days due to agitation.  While in the ED evaluated by psychiatry who made changes to his medication.  Patient is currently a DNR/DNI patient was admitted to try to improve patient environment, circadian rhythm and provide a window which can help improve the patient psychiatric status.  Palliative care is also on board.    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  Further med management per psychiatry, currently on lorazepam 0.5 mg 3 times daily and additional as needed as necessary.  Palliative care team following, currently DNR/DNI.  TOC involved awaiting Medicaid application for long-term placement. Per neurology, unlikely to improve further.   Hypovolemic hypernatremia: Stable.  Periodic checks   Weight loss: Malnutrition Po remains poor, was 72 kg in 2023, 47 kg last month, now 43.5 kg on most recent weight - consider placement of PEG feeding tube if consistent with family's goals of care   Sinus tachycardia Improved   Essential hypertension Not on any meds.  IV as needed   Extensive palliative care meeting, mother considering hospice care.   DVT prophylaxis: lovenox Family  Communication:none Status is: Inpatient Remains inpatient appropriate because: placement issues, TOC working on it    Subjective:  Seen at bedside, no meaningful interaction.  Examination:  General exam: Appears calm and comfortable  Respiratory system: Clear to auscultation. Respiratory effort normal. Cardiovascular system: S1 & S2 heard, RRR. No JVD, murmurs, rubs, gallops or clicks. No pedal edema. Gastrointestinal system: Abdomen is nondistended, soft and nontender. No organomegaly or masses felt. Normal bowel sounds heard. Central nervous system: Alert and oriented. No focal neurological deficits. Extremities: Symmetric 5 x 5 power. Skin: No rashes, lesions or ulcers Psychiatry: Judgement and insight appear normal. Mood & affect appropriate.                Diet Orders (From admission, onward)     Start     Ordered   03/13/23 1330  Diet regular Room service appropriate? Yes; Fluid consistency: Thin  Diet effective now       Question Answer Comment  Room service appropriate? Yes   Fluid consistency: Thin      03/13/23 1330            Objective: Vitals:   03/25/23 2115 03/26/23 0342 03/26/23 0616 03/26/23 0724  BP: 100/69  108/63 (!) 87/60  Pulse: 86  96 86  Resp: 18  16 14   Temp: (!) 97.5 F (36.4 C)  99 F (37.2 C)   TempSrc: Oral  Axillary   SpO2: 100%  97% 100%  Weight:  44.3 kg    Height:        Intake/Output Summary (Last 24 hours) at 03/26/2023 1227 Last data filed at 03/26/2023 [phone]  Gross per 24 hour  Intake 0 ml  Output --  Net 0 ml   Filed Weights   03/23/23 0346 03/24/23 0533 03/26/23 0342  Weight: 47 kg 43.5 kg 44.3 kg    Scheduled Meds:  (feeding supplement) PROSource Plus  30 mL Oral BID BM   acetaminophen  1,000 mg Oral TID   enoxaparin (LOVENOX) injection  30 mg Subcutaneous Q24H   feeding supplement  237 mL Oral BID BM   fiber supplement (BANATROL TF)  60 mL Oral BID   LORazepam  0.5 mg Oral TID   multivitamin with  minerals  1 tablet Oral Daily   sodium chloride flush  3 mL Intravenous Q12H   Continuous Infusions:  Nutritional status Signs/Symptoms: severe fat depletion, severe muscle depletion, energy intake < or equal to 50% for > or equal to 5 days Interventions: Hormel Shake, Ensure Enlive (each supplement provides 350kcal and 20 grams of protein), Prostat Body mass index is 15.76 kg/m.  Data Reviewed:   CBC: Recent Labs  Lab 03/26/23 1019  WBC 5.5  HGB 13.5  HCT 40.6  MCV 88.5  PLT 288   Basic Metabolic Panel: Recent Labs  Lab 03/20/23 0855 03/21/23 1555 03/22/23 0432 03/25/23 0603 03/26/23 1019  NA 151* 149* 142 145 144  K 3.9 4.4 3.5 5.0 4.6  CL 117* 116* 115* 109 113*  CO2 24 26 25 24 24   GLUCOSE 84 100* 110* 71 89  BUN 29* 24* 19 13 19   CREATININE 1.30* 1.46* 1.10* 1.12* 1.26*  CALCIUM 8.5* 8.7* 8.1* 9.0 8.7*  MG  --   --   --  2.3 2.2  PHOS  --   --   --  3.4 3.0   GFR: Estimated Creatinine Clearance: 35.3 mL/min (A) (by C-G formula based on SCr of 1.26 mg/dL (H)). Liver Function Tests: No results for input(s): "AST", "ALT", "ALKPHOS", "BILITOT", "PROT", "ALBUMIN" in the last 168 hours. No results for input(s): "LIPASE", "AMYLASE" in the last 168 hours. No results for input(s): "AMMONIA" in the last 168 hours. Coagulation Profile: No results for input(s): "INR", "PROTIME" in the last 168 hours. Cardiac Enzymes: No results for input(s): "CKTOTAL", "CKMB", "CKMBINDEX", "TROPONINI" in the last 168 hours. BNP (last 3 results) No results for input(s): "PROBNP" in the last 8760 hours. HbA1C: No results for input(s): "HGBA1C" in the last 72 hours. CBG: No results for input(s): "GLUCAP" in the last 168 hours. Lipid Profile: No results for input(s): "CHOL", "HDL", "LDLCALC", "TRIG", "CHOLHDL", "LDLDIRECT" in the last 72 hours. Thyroid Function Tests: No results for input(s): "TSH", "T4TOTAL", "FREET4", "T3FREE", "THYROIDAB" in the last 72 hours. Anemia Panel: No  results for input(s): "VITAMINB12", "FOLATE", "FERRITIN", "TIBC", "IRON", "RETICCTPCT" in the last 72 hours. Sepsis Labs: No results for input(s): "PROCALCITON", "LATICACIDVEN" in the last 168 hours.  No results found for this or any previous visit (from the past 240 hour(s)).       Radiology Studies: No results found.         LOS: 13 days   Time spent= 35 mins    Miguel Rota, MD [facility]  If 7PM-7AM, please contact night-coverage  03/26/2023, 12:27 PM

## 2023-03-26 NOTE — TOC Progression Note (Addendum)
Transition of Care [facility]) - Progression Note    Patient Details  Name: Natalie Brown MRN: [id number] Date of Birth: 02-09-1966  Transition of Care [facility]) CM/SW Contact  Franck Vinal A [place], [place] Phone Number: 03/26/2023, 11:56 AM  Clinical Narrative:     CSW contacted [facility] and surrounding local SNF facilities regarding possible placement for LTC. [facility] declined pt stating that funding, even with LOG could not be accommodated by the facility at this time.  [name] has not yet replied, CSW to reach out with update on referral.Other possible bed offers pending.   TOC will continue to follow.        Expected Discharge Plan and Services                                               Social Determinants of Health (SDOH) Interventions SDOH Screenings   Food Insecurity: No Food Insecurity (01/24/2023)  Housing: Low Risk  (01/24/2023)  Transportation Needs: Patient Unable To Answer (01/24/2023)  Utilities: Not At Risk (01/24/2023)  Alcohol Screen: Low Risk  (09/06/2021)  Depression (PHQ2-9): Medium Risk (05/26/2020)  Tobacco Use: High Risk (02/08/2023)    Readmission Risk Interventions     No data to display

## 2023-03-26 NOTE — TOC Progression Note (Signed)
Transition of Care [facility]) - Progression Note    Patient Details  Name: Natalie Brown MRN: [id number] Date of Birth: Jun 21, 1965  Transition [facility]) CM/SW Contact  Janae Bridgeman, RN Phone Number: 03/26/2023, 11:18 AM  Clinical Narrative:    CM spoke with Glenna Fellows, RNCM with [facility] and they are unfortunately unable to offer an Inpatient Hospice facility bed at [facility] at this time.  [facility] is willing to accept the patient and follow the patient for hospice services at a LTC facility.  No LTC bed offers at this time.        Expected Discharge Plan and Services                                               Social Determinants of Health (SDOH) Interventions SDOH Screenings   Food Insecurity: No Food Insecurity (01/24/2023)  Housing: Low Risk  (01/24/2023)  Transportation Needs: Patient Unable To Answer (01/24/2023)  Utilities: Not At Risk (01/24/2023)  Alcohol Screen: Low Risk  (09/06/2021)  Depression (PHQ2-9): Medium Risk (05/26/2020)  Tobacco Use: High Risk (02/08/2023)    Readmission Risk Interventions     No data to display

## 2023-03-26 NOTE — Plan of Care (Signed)
  Problem: Clinical Measurements: Goal: Ability to maintain clinical measurements within normal limits will improve Outcome: Progressing Goal: Will remain free from infection Outcome: Progressing Goal: Diagnostic test results will improve Outcome: Progressing Goal: Respiratory complications will improve Outcome: Progressing Goal: Cardiovascular complication will be avoided Outcome: Progressing   Problem: Activity: Goal: Risk for activity intolerance will decrease Outcome: Progressing   Problem: Nutrition: Goal: Adequate nutrition will be maintained Outcome: Progressing   Problem: Coping: Goal: Level of anxiety will decrease Outcome: Progressing   Problem: Elimination: Goal: Will not experience complications related to bowel motility Outcome: Progressing Goal: Will not experience complications related to urinary retention Outcome: Progressing   Problem: Pain Management: Goal: General experience of comfort will improve Outcome: Progressing   Problem: Safety: Goal: Ability to remain free from injury will improve Outcome: Progressing   Problem: Skin Integrity: Goal: Risk for impaired skin integrity will decrease Outcome: Progressing   Problem: Education: Goal: Knowledge of General Education information will improve Description: Including pain rating scale, medication(s)/side effects and non-pharmacologic comfort measures Outcome: Not Progressing   Problem: Health Behavior/Discharge Planning: Goal: Ability to manage health-related needs will improve Outcome: Not Progressing Patient with worsening dementia and altered mental status, will provide re education.

## 2023-03-27 MED ORDER — LORAZEPAM 2 MG/ML IJ SOLN
1.0000 mg | Freq: Four times a day (QID) | INTRAMUSCULAR | Status: DC | PRN
  Administered 2023-03-28 – 2023-04-05 (×7): 1 mg via INTRAMUSCULAR
  Filled 2023-03-27 (×7): qty 1

## 2023-03-27 NOTE — Progress Notes (Addendum)
Patient bed alarm sounded and patient was found on buttocks, at the side of the bed on mat. No visible signs of injury or pain. Patient was somnolent most of the night , unable to give PM meds. Prn ativan was given, On call provider notified and all safety precautions to remain in place. No new orders   Orders placed for 1:1 obesrvation, and bed alarms  6:15 Mother Mrs Dan Humphreys notified

## 2023-03-27 NOTE — Progress Notes (Signed)
PROGRESS NOTE    Natalie Brown  ZOX:[id number] DOB: [birth year] DOA: 01/22/2023 PCP: Lavinia Sharps, NP    Brief Narrative:   57 y.o. female history significant for early onset dementia, bipolar disorder, catatonia essential hypertension sinus tachycardia B12 deficiency severe protein caloric malnutrition he was in the hospital for more than 400 days discharged on 01/22/2023 only to come back to the ED after 49 days due to agitation.  While in the ED evaluated by psychiatry who made changes to his medication.  Patient is currently a DNR/DNI patient was admitted to try to improve patient environment, circadian rhythm and provide a window which can help improve the patient psychiatric status.  Palliative care is also on board.    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  Further med management per psychiatry, currently on lorazepam 0.5 mg 3 times daily and additional as needed as necessary.  Palliative care team following, currently DNR/DNI.  TOC involved awaiting Medicaid application for long-term placement. Per neurology, unlikely to improve further.   Hypovolemic hypernatremia: Stable.  Periodic checks   Weight loss: Malnutrition Po remains poor, was 72 kg in 2023, 47 kg last month, now 43.5 kg on most recent weight - consider placement of PEG feeding tube if consistent with family's goals of care   Sinus tachycardia Improved   Essential hypertension Not on any meds.  IV as needed   Extensive palliative care meeting, mother considering hospice care.   DVT prophylaxis: lovenox Family  Communication:none Status is: Inpatient Remains inpatient appropriate because: placement issues, TOC working on it    Subjective:  Unwitnessed fall from her bed.  Already has bed alarm on.  Denies any trauma and no evidence of trauma.  Overnight only bed alarm, sitter was not available.  Examination:  General exam: Appears calm and comfortable  Respiratory system: Clear to auscultation. Respiratory effort normal. Cardiovascular system: S1 & S2 heard, RRR. No JVD, murmurs, rubs, gallops or clicks. No pedal edema. Gastrointestinal system: Abdomen is nondistended, soft and nontender. No organomegaly or masses felt. Normal bowel sounds heard. Central nervous system: Alert and oriented. No focal neurological deficits. Extremities: Symmetric 5 x 5 power. Skin: No rashes, lesions or ulcers Psychiatry: Judgement and insight appear normal. Mood & affect appropriate.                Diet Orders (From admission, onward)     Start     Ordered   03/13/23 1330  Diet regular Room service appropriate? Yes; Fluid consistency: Thin  Diet effective now       Question Answer Comment  Room service appropriate? Yes   Fluid consistency: Thin      03/13/23 1330            Objective: Vitals:   03/26/23 1721 03/27/23 0323 03/27/23 0329 03/27/23 0620  BP: 93/61 (!) 190/147 (!) 110/90 101/63  Pulse: 86 (!) 102 100 91  Resp: 18 18  18   Temp:  100.1 F (37.8 C) 99 F (37.2 C) (!) 97.5 F (36.4 C)  TempSrc:  Axillary  Axillary  SpO2:  99%  100%  Weight:  Height:        Intake/Output Summary (Last 24 hours) at 03/27/2023 1216 Last data filed at 03/26/2023 2200 Gross per 24 hour  Intake 3 ml  Output --  Net 3 ml   Filed Weights   03/23/23 0346 03/24/23 0533 03/26/23 0342  Weight: 47 kg 43.5 kg 44.3 kg    Scheduled Meds:  (feeding supplement) PROSource Plus  30 mL Oral BID BM   acetaminophen  1,000 mg Oral TID   enoxaparin (LOVENOX) injection  30 mg Subcutaneous Q24H    feeding supplement  237 mL Oral BID BM   fiber supplement (BANATROL TF)  60 mL Oral BID   LORazepam  0.5 mg Oral TID   multivitamin with minerals  1 tablet Oral Daily   sodium chloride flush  3 mL Intravenous Q12H   Continuous Infusions:  Nutritional status Signs/Symptoms: severe fat depletion, severe muscle depletion, energy intake < or equal to 50% for > or equal to 5 days Interventions: Hormel Shake, Ensure Enlive (each supplement provides 350kcal and 20 grams of protein), Prostat Body mass index is 15.76 kg/m.  Data Reviewed:   CBC: Recent Labs  Lab 03/26/23 1019  WBC 5.5  HGB 13.5  HCT 40.6  MCV 88.5  PLT 288   Basic Metabolic Panel: Recent Labs  Lab 03/21/23 1555 03/22/23 0432 03/25/23 0603 03/26/23 1019  NA 149* 142 145 144  K 4.4 3.5 5.0 4.6  CL 116* 115* 109 113*  CO2 26 25 24 24   GLUCOSE 100* 110* 71 89  BUN 24* 19 13 19   CREATININE 1.46* 1.10* 1.12* 1.26*  CALCIUM 8.7* 8.1* 9.0 8.7*  MG  --   --  2.3 2.2  PHOS  --   --  3.4 3.0   GFR: Estimated Creatinine Clearance: 35.3 mL/min (A) (by C-G formula based on SCr of 1.26 mg/dL (H)). Liver Function Tests: No results for input(s): "AST", "ALT", "ALKPHOS", "BILITOT", "PROT", "ALBUMIN" in the last 168 hours. No results for input(s): "LIPASE", "AMYLASE" in the last 168 hours. No results for input(s): "AMMONIA" in the last 168 hours. Coagulation Profile: No results for input(s): "INR", "PROTIME" in the last 168 hours. Cardiac Enzymes: No results for input(s): "CKTOTAL", "CKMB", "CKMBINDEX", "TROPONINI" in the last 168 hours. BNP (last 3 results) No results for input(s): "PROBNP" in the last 8760 hours. HbA1C: No results for input(s): "HGBA1C" in the last 72 hours. CBG: No results for input(s): "GLUCAP" in the last 168 hours. Lipid Profile: No results for input(s): "CHOL", "HDL", "LDLCALC", "TRIG", "CHOLHDL", "LDLDIRECT" in the last 72 hours. Thyroid Function Tests: No results for input(s): "TSH",  "T4TOTAL", "FREET4", "T3FREE", "THYROIDAB" in the last 72 hours. Anemia Panel: No results for input(s): "VITAMINB12", "FOLATE", "FERRITIN", "TIBC", "IRON", "RETICCTPCT" in the last 72 hours. Sepsis Labs: No results for input(s): "PROCALCITON", "LATICACIDVEN" in the last 168 hours.  No results found for this or any previous visit (from the past 240 hour(s)).       Radiology Studies: No results found.         LOS: 14 days   Time spent= 35 mins    Miguel Rota, MD [facility]  If 7PM-7AM, please contact night-coverage  03/27/2023, 12:16 PM

## 2023-03-27 NOTE — Progress Notes (Signed)
  Patient's nurse reported that patient is not taking any p.o. med.  Also she does not have any IV access.  Cannot give neither p.o. or IV Ativan. - Ordered Ativan 1 mg every 6 as needed IM for agitation. - Renewed bedside sitter for safety observation.

## 2023-03-27 NOTE — Plan of Care (Signed)

## 2023-03-27 NOTE — Progress Notes (Signed)
  Patient's nurse reported that patient had a unwitnessed fall from the bed on the ground.  Patient already had bed alarm on and that is how they found patient had a fall.  She found on the ground on her buttocks.  Denies any pain.  Patient did not hit her head.  There is already a soft mattress/pads surrounding her bed. -Plan to continue further fall precaution, keep the bed alarm activated and need to establish bedside sitter which is not available until morning. -Keep patient's room closer to the nursing station and keep an eye on her closely. -Given patient does not have any significant injury deferring further imaging at this moment.   Tereasa Coop, MD [facility] 03/27/2023, 3:57 AM

## 2023-03-28 DIAGNOSIS — F03918 Unspecified dementia, unspecified severity, with other behavioral disturbance: Secondary | ICD-10-CM

## 2023-03-28 NOTE — Progress Notes (Signed)
PROGRESS NOTE    Natalie Brown  IHK:[id number] DOB: 1965-12-30 DOA: 01/22/2023 PCP: Lavinia Sharps, NP    Brief Narrative:   57 y.o. female history significant for early onset dementia, bipolar disorder, catatonia essential hypertension sinus tachycardia B12 deficiency severe protein caloric malnutrition he was in the hospital for more than 400 days discharged on 01/22/2023 only to come back to the ED after 49 days due to agitation.  While in the ED evaluated by psychiatry who made changes to his medication.  Patient is currently a DNR/DNI patient was admitted to try to improve patient environment, circadian rhythm and provide a window which can help improve the patient psychiatric status.  Palliative care is also on board.    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  Further med management per psychiatry, currently on lorazepam 0.5 mg 3 times daily and additional as needed as necessary.  Palliative care team following, currently DNR/DNI.  TOC involved awaiting Medicaid application for long-term placement. Per neurology, unlikely to improve further.   Hypovolemic hypernatremia: Stable.  Periodic checks   Weight loss: Malnutrition Po remains poor, was 72 kg in 2023, 47 kg last month, now 43.5 kg on most recent weight - consider placement of PEG feeding tube if consistent with family's goals of care   Sinus tachycardia Improved   Essential hypertension Not on any meds.  IV as needed   Extensive palliative care meeting, mother considering hospice care.   DVT prophylaxis: lovenox Family  Communication:none Status is: Inpatient Remains inpatient appropriate because: placement issues, TOC working on it    Subjective: No complaints.  No meaningful interaction  Examination:  General exam: No meaningful interaction cachectic frail Respiratory system: Clear to auscultation. Respiratory effort normal. Cardiovascular system: S1 & S2 heard, RRR. No JVD, murmurs, rubs, gallops or clicks. No pedal edema. Gastrointestinal system: Abdomen is nondistended, soft and nontender. No organomegaly or masses felt. Normal bowel sounds heard. Central nervous system unable to assess Skin: No rashes, lesions or ulcers Psychiatry: Poor               Diet Orders (From admission, onward)     Start     Ordered   03/13/23 1330  Diet regular Room service appropriate? Yes; Fluid consistency: Thin  Diet effective now       Question Answer Comment  Room service appropriate? Yes   Fluid consistency: Thin      03/13/23 1330            Objective: Vitals:   03/26/23 1721 03/27/23 0323 03/27/23 0329 03/27/23 0620  BP: 93/61 (!) 190/147 (!) 110/90 101/63  Pulse: 86 (!) 102 100 91  Resp: 18 18  18   Temp:  100.1 F (37.8 C) 99 F (37.2 C) (!) 97.5 F (36.4 C)  TempSrc:  Axillary  Axillary  SpO2:  99%  100%  Weight:      Height:        Intake/Output Summary (Last 24 hours) at 03/28/2023 1126 Last data filed at 03/28/2023 0830 Gross per 24 hour  Intake 150 ml  Output --  Net 150 ml  Filed Weights   03/23/23 0346 03/24/23 0533 03/26/23 0342  Weight: 47 kg 43.5 kg 44.3 kg    Scheduled Meds:  (feeding supplement) PROSource Plus  30 mL Oral BID BM   acetaminophen  1,000 mg Oral TID   enoxaparin (LOVENOX) injection  30 mg Subcutaneous Q24H   feeding supplement  237 mL Oral BID BM   fiber supplement (BANATROL TF)  60 mL Oral BID   LORazepam  0.5 mg Oral TID   multivitamin with minerals  1 tablet Oral Daily   sodium chloride flush  3 mL Intravenous Q12H   Continuous  Infusions:  Nutritional status Signs/Symptoms: severe fat depletion, severe muscle depletion, energy intake < or equal to 50% for > or equal to 5 days Interventions: Hormel Shake, Ensure Enlive (each supplement provides 350kcal and 20 grams of protein), Prostat Body mass index is 15.76 kg/m.  Data Reviewed:   CBC: Recent Labs  Lab 03/26/23 1019  WBC 5.5  HGB 13.5  HCT 40.6  MCV 88.5  PLT 288   Basic Metabolic Panel: Recent Labs  Lab 03/21/23 1555 03/22/23 0432 03/25/23 0603 03/26/23 1019  NA 149* 142 145 144  K 4.4 3.5 5.0 4.6  CL 116* 115* 109 113*  CO2 26 25 24 24   GLUCOSE 100* 110* 71 89  BUN 24* 19 13 19   CREATININE 1.46* 1.10* 1.12* 1.26*  CALCIUM 8.7* 8.1* 9.0 8.7*  MG  --   --  2.3 2.2  PHOS  --   --  3.4 3.0   GFR: Estimated Creatinine Clearance: 35.3 mL/min (A) (by C-G formula based on SCr of 1.26 mg/dL (H)). Liver Function Tests: No results for input(s): "AST", "ALT", "ALKPHOS", "BILITOT", "PROT", "ALBUMIN" in the last 168 hours. No results for input(s): "LIPASE", "AMYLASE" in the last 168 hours. No results for input(s): "AMMONIA" in the last 168 hours. Coagulation Profile: No results for input(s): "INR", "PROTIME" in the last 168 hours. Cardiac Enzymes: No results for input(s): "CKTOTAL", "CKMB", "CKMBINDEX", "TROPONINI" in the last 168 hours. BNP (last 3 results) No results for input(s): "PROBNP" in the last 8760 hours. HbA1C: No results for input(s): "HGBA1C" in the last 72 hours. CBG: No results for input(s): "GLUCAP" in the last 168 hours. Lipid Profile: No results for input(s): "CHOL", "HDL", "LDLCALC", "TRIG", "CHOLHDL", "LDLDIRECT" in the last 72 hours. Thyroid Function Tests: No results for input(s): "TSH", "T4TOTAL", "FREET4", "T3FREE", "THYROIDAB" in the last 72 hours. Anemia Panel: No results for input(s): "VITAMINB12", "FOLATE", "FERRITIN", "TIBC", "IRON", "RETICCTPCT" in the last 72 hours. Sepsis Labs: No results for input(s):  "PROCALCITON", "LATICACIDVEN" in the last 168 hours.  No results found for this or any previous visit (from the past 240 hour(s)).       Radiology Studies: No results found.         LOS: 15 days   Time spent= 35 mins    Miguel Rota, MD [facility]  If 7PM-7AM, please contact night-coverage  03/28/2023, 11:26 AM

## 2023-03-28 NOTE — Consult Note (Signed)
[facility] Psychiatry Followup Face-to-Face Psychiatric Evaluation   Name: Natalie Brown DOB: 1965/06/11 MRN: [id number] Service Date: March 28, 2023 LOS:  LOS: 15 days  Reason for Consult: sedation Referring Provider: None - no consult order placed   Assessment  Natalie Brown is a 57 y.o. female admitted medically for 01/22/2023  3:31 PM for possible catatonia/altered mental status. She carries the psychiatric diagnoses of bipolar affective disorder with psychotic symptoms and has a past medical history of hypertension. This is the first time the psychiatry team has seen her this hotpitalization.   This patient has had an unfortunate, rapid decline over the year+. She was initially admitted to the behavioral health hospital after decompensating with hx bipolar I disorder (was sleeping outside in the rain, confused, issues remaining on task) in 08/2021. Her psychosis and mania stabilized after about 4 weeks of admission w/ multiple medication trials (Zyprexa, haldol, and Invega). This admission revealed a significant and rapidly progressive dementia as despite improvement in psychiatric symptoms, she never returned to baseline. She had a prolonged admission to behavioral health due to no safe discharge options and was transferred to the hospital in 12/2021 after 4 months after developing delirium/encephalopathy while admitted to [facility]. She was admitted to [facility] from 12/2021 to 01/2023; was discharged ot a care facility and came back to the ED within <24 hours due to agitation. Over the past year, she has had periods of becoming almost totally nonverbal with staff (more likely to interact with family) however has not been overtly manic or psychotic during that time span; dementia dx and sx have largely superceded psychiatric clinical features. Psychiatry consult team has been involved intermittently in her care over the last year (saw at admission 12/2021 when [facility] regimen continued, in  06/2022, in 10/2022, in 12/2022 and a few times in the ED in 02/2023. Briefly, changes made by psychiatry team included tapering off of haloperidol d/t rigidity/dystonia - attempted to transition to paliperidone however mother/guardian did not pick up the phone and eventual diagnosis of comorbid catatonia an starting lorazepam with good clinical response (began walking, talking, eating). She had previously regressed with increase of olanzapine which was tapered off; at some point was restarted on 2.5 mg at bedtime. Her psychiatric medications at time of consult are listed below. We are not charging for this encounter as no consult order placed.   On evaluation 11/29 pt showed mild improvement. She was awake and aware of examiner. She was hesitant to allow physical exam - at first quite rigid (clearly volitional) but after about the same as yesterday. Only allowed exam of one of her upper extremities. Continue to recommend medical w/u of AMS in demented pt such as CXR, u/a, etc.  Feel risk >>>> benefit of formal ativan trial at this time (encephalopathy due to medical cause or just progression of terminal dementia  remains most likely driver of AMS). On chart review seems to be more interactive and has started eating again; hopefully sodium will self-regulate. Discussed low BP w/ primary. Feel benefits of ativan still outweigh risks (do not need w/d).   On 12/3, participated in palliative care family meeting - mom open to hospice w/o life sustaining care. Pt given 1 addl mg oral ativan to test response - became more alert but not more interactive, overall poor response. Have asked nursing to reach out if there is tangible benefit like pt eating, vocalizing, etc after increased dose otherwise will leave at 0.5 TID.   On  12/5; the patient briefly reduced medications but this morning (12/6), she has been taking PO.  She has been more sedated however; did not do Ativan challenge.   PLEASE CALL MOTHER PRIOR TO ALL  NON-EMERGENT MED CHANGES Psychiatry team is following; will see 1-2x/week.  Current sx moslty 2/2 dementia. Further management per primary.  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   ## Medications:  Continue with lorazepam as monotherpay (not ideal in dementia, but pt w/ hx of catatonia and multiple adverse events w/ neuroleptics.   Continue  lorazepam 0.5 mg TID catatonia  OK to hold for sedation; would like her to get at least one-two doses a day (w/d risk) Continue lorazepam 0.5 mg TID PRN anxiety/agitation (prior catatonia) Continue  lorazepam 1 mg IM PRN agitation only ---> changed to 0.5 mg We could consider adding Memantine as an adjunct for catatonia; mom has to be informed before adding.    Mom informed of these changes.   -- prior propranolol d/c 03/2022, atropine dc 01/2022, haldol dc sometime in 2023 and again 03/20/2023, zyprexa dc 03/20/23  ## Medical Decision Making Capacity:  Patient has interim legal guardian, mother Natalie Brown [phone]) as she is incompetent.   ## Further Work-up:  -- most recent EKG on 02/08/2023  had QtC of 432  in setting of tachycardia -- last head imaging 10/3 with pronounced cerebral atrophy.  -- lipid/A1c don't seem to have run, however, now off antipsychotics.   -- Pertinent labwork reviewed earlier this admission includes:  Baseline GFR decreased (now seems to be about 1.3). Hypernatremia/  Ammonia most  recently WNL  ## Disposition:  --Recommend memory care unit if possible but defer to SW  ## Behavioral / Environmental:  --  delirium precautions     ##Legal Status Patient with legal guardian  Thank you for this consult request. Recommendations have been communicated to the primary team.   Psychiatry will continue to follow.  Stark Klein, MD   NEW history  Relevant Aspects of Hospital Course:  Admitted on 01/22/2023 to normalize circadian rhythm after decompensating in >1 mo ED stay. Last received PRN antipsychotics 11/25. Had gotten PRN hydroxyzine about once a day.   Patient Report:  Unable to obtain report as patient is nonverbal. Prior to extra dose ativan, curled up at bottom of bed. After - more awake but not interactive at all, pulling away when I tried to examine, minimal change in muscle rigidity (negative response).    Collateral information: Called mom/guardain Natalie Brown who was fairly upset haldol was restarted even as PRN and had no recollection of hydroxyzine PRN. I tried to emphasize that these medications are at low doses and she had not gotten any PRN antipsychotics.  I also added haldol to allergy lsit.   Psychiatric History:  Information collected from chart - pt unable to provide details  Bipolar affective disorder with psychotic features, MNCD Admitted  to [facility] Sep 06, 2021  Family psych history: Patient reports mental illness runs in her father's side of the family.   Social History:  Tobacco use: Half PPD; no use since admission Alcohol use: No use since admission Drug use: Denies; UDS negative on admission in May  Family History:  No pertinent FHx  Medical History: Past Medical History:  Diagnosis Date   Bipolar 1 disorder with moderate mania (HCC) 12/30/2013   Hypertension     Surgical History: History reviewed. No pertinent surgical history.  Medications:   Current Facility-Administered Medications:    (feeding supplement)  PROSource Plus liquid 30 mL, 30 mL, Oral, BID BM, Synetta Fail, MD, 30 mL at 03/28/23 0935   acetaminophen (TYLENOL) tablet 650 mg, 650 mg, Oral, Q6H PRN **OR** acetaminophen (TYLENOL) suppository 650 mg, 650 mg, Rectal, Q6H PRN, Synetta Fail, MD   acetaminophen (TYLENOL) tablet 1,000 mg, 1,000 mg, Oral, TID, Synetta Fail, MD, 1,000 mg at 03/27/23 2236   enoxaparin (LOVENOX) injection 30 mg, 30 mg, Subcutaneous, Q24H, Synetta Fail, MD, 30 mg at 03/27/23 2235   feeding supplement (ENSURE ENLIVE / ENSURE PLUS) liquid 237 mL, 237 mL, Oral, BID BM, Synetta Fail, MD, 237 mL at 03/28/23 0934   fiber supplement (BANATROL TF) liquid 60 mL, 60 mL, Oral, BID, Marinda Elk, MD, 60 mL at 03/28/23 0930   guaiFENesin (ROBITUSSIN) 100 MG/5ML liquid 5 mL, 5 mL, Oral, Q4H PRN, Amin, Ankit C, MD   hydrALAZINE (APRESOLINE) injection 10 mg, 10 mg, Intravenous, Q4H PRN, Amin, Ankit C, MD   lactulose (CHRONULAC) 10 GM/15ML solution 20 g, 20 g, Oral, BID PRN, Synetta Fail, MD   LORazepam (ATIVAN) 2 MG/ML concentrated solution 0.5 mg, 0.5 mg, Oral, TID, Marinda Elk, MD, 0.5 mg at 03/28/23 1611   LORazepam (ATIVAN) injection 1 mg, 1 mg, Intramuscular, Q6H PRN, Janalyn Shy, Subrina, MD, 1 mg at 03/28/23 0005   metoprolol tartrate (LOPRESSOR) injection 5 mg, 5 mg, Intravenous, Q4H PRN, Amin, Ankit C, MD   multivitamin with minerals tablet 1 tablet, 1 tablet, Oral, Daily, Synetta Fail, MD, 1 tablet at 03/26/23 0956   ondansetron (ZOFRAN) injection 4 mg, 4 mg, Intravenous, Q6H PRN, Amin, Ankit C, MD   senna-docusate (Senokot-S) tablet 1 tablet, 1 tablet, Oral, QHS PRN, Amin, Ankit C, MD   sodium chloride flush (NS) 0.9 % injection 3 mL, 3 mL, Intravenous, Q12H, Synetta Fail, MD, 3 mL at 03/27/23 0921   traZODone (DESYREL) tablet 50 mg, 50 mg, Oral, QHS PRN, Amin, Ankit C, MD  Allergies: Allergies  Allergen Reactions   Haloperidol And Related     Dystonia,  catatonia.    Penicillins Itching       Objective  Vital signs:     Mental Status Exam: Natalie Brown a 57 year old AA female, in her bed with legs usually contracted under her. She appeared slightly younger than his stated age and appears extremely malnourished. During the interview, she was observed to occasionally move herself around in bed. Eyes open on later eval but not tracking examiner. Reluctant to cooperate in physical exam and did not follow commands.  She did not attempt to vocalize. Impossible to judge insight/judgment/attention/orientation but assume lacking/poor. She did not appear to be responding to internal stimuli.    Physical Exam: Physical Exam Vitals reviewed.  Constitutional:      General: [name] not in acute distress.    Appearance: [name] not toxic-appearing.  Comments: thin  HENT:     Head: Normocephalic and atraumatic.  Pulmonary:     Effort: Pulmonary effort is normal.  Neurological:     General: No focal deficit present.     Blood pressure 101/63, pulse 91, temperature (!) 97.5 F (36.4 C), temperature source Axillary, resp. rate 18, height 5\' 6"  (1.676 m), weight 44.3 kg, SpO2 100%. Body mass index is 15.76 kg/m.

## 2023-03-28 NOTE — Plan of Care (Signed)

## 2023-03-28 NOTE — Plan of Care (Signed)
  Problem: Education: Goal: Knowledge of General Education information will improve Description: Including pain rating scale, medication(s)/side effects and non-pharmacologic comfort measures Outcome: Progressing   Problem: Health Behavior/Discharge Planning: Goal: Ability to manage health-related needs will improve Outcome: Progressing   Problem: Coping: Goal: Level of anxiety will decrease Outcome: Progressing   Problem: Elimination: Goal: Will not experience complications related to bowel motility Outcome: Progressing Goal: Will not experience complications related to urinary retention Outcome: Progressing   Problem: Pain Management: Goal: General experience of comfort will improve Outcome: Progressing

## 2023-03-29 NOTE — Progress Notes (Signed)
Bedside report received on pt during morning shift change. Pt noted to be sleeping on left side, respirations even and unlabored. Safety sitter at bedside.  Amin, MD aware pt noted to be lethargic and sleeping majority of day with minimal PO intake. Meds not given due to risk of aspiration.

## 2023-03-29 NOTE — Progress Notes (Signed)
PROGRESS NOTE    Natalie Brown  ZOX:[id number] DOB: 19-Dec-1965 DOA: 01/22/2023 PCP: Lavinia Sharps, NP    Brief Narrative:   57 y.o. female history significant for early onset dementia, bipolar disorder, catatonia essential hypertension sinus tachycardia B12 deficiency severe protein caloric malnutrition he was in the hospital for more than 400 days discharged on 01/22/2023 only to come back to the ED after 49 days due to agitation.  While in the ED evaluated by psychiatry who made changes to his medication.  Patient is currently a DNR/DNI patient was admitted to try to improve patient environment, circadian rhythm and provide a window which can help improve the patient psychiatric status.  Palliative care is also on board. Difficulty with placement.    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  Further med management per psychiatry, currently on lorazepam 0.5 mg 3 times daily and additional as needed as necessary.  Palliative care team following, currently DNR/DNI.  TOC involved awaiting Medicaid application for long-term placement. Per neurology, unlikely to improve further.   Hypovolemic hypernatremia: Stable.  Periodic checks   Weight loss: Malnutrition Po remains poor, was 72 kg in 2023, 47 kg last month, now 43.5 kg on most recent weight - consider placement of PEG feeding tube if consistent with family's goals of care   Sinus tachycardia Improved   Essential hypertension Not on any meds.  IV as needed   Extensive palliative care meeting, mother considering hospice care.   DVT  prophylaxis: lovenox Family Communication:none Status is: Inpatient Remains inpatient appropriate because: placement issues, TOC working on it  Subjective: Set designer at bedside  Examination:  General exam: No meaningful interaction cachectic frail Respiratory system: Clear to auscultation. Respiratory effort normal. Cardiovascular system: S1 & S2 heard, RRR. No JVD, murmurs, rubs, gallops or clicks. No pedal edema. Gastrointestinal system: Abdomen is nondistended, soft and nontender. No organomegaly or masses felt. Normal bowel sounds heard. Central nervous system unable to assess Skin: No rashes, lesions or ulcers Psychiatry: Poor               Diet Orders (From admission, onward)     Start     Ordered   03/13/23 1330  Diet regular Room service appropriate? Yes; Fluid consistency: Thin  Diet effective now       Question Answer Comment  Room service appropriate? Yes   Fluid consistency: Thin      03/13/23 1330            Objective: Vitals:   03/28/23 1701 03/28/23 2333 03/29/23 0500 03/29/23 0807  BP: (!) 109/91 (!) 122/52  105/64  Pulse: 79 82  66  Resp: 16 16  19   Temp: 98.3 F (36.8 C) 98.4 F (36.9 C)    TempSrc: Oral Axillary    SpO2: 100%     Weight:   44.3 kg   Height:        Intake/Output Summary (Last 24 hours) at 03/29/2023 1306 Last data filed at 03/29/2023 0900 Gross per 24 hour  Intake 560 ml  Output --  Net 560 ml   [employer]  03/24/23 0533 03/26/23 0342 03/29/23 0500  Weight: 43.5 kg 44.3 kg 44.3 kg    Scheduled Meds:  (feeding supplement) PROSource Plus  30 mL Oral BID BM   acetaminophen  1,000 mg Oral TID   enoxaparin (LOVENOX) injection  30 mg Subcutaneous Q24H   feeding supplement  237 mL Oral BID BM   fiber supplement (BANATROL TF)  60 mL Oral BID   LORazepam  0.5 mg Oral TID   multivitamin with minerals  1 tablet Oral Daily   sodium chloride flush  3 mL Intravenous Q12H   Continuous  Infusions:  Nutritional status Signs/Symptoms: severe fat depletion, severe muscle depletion, energy intake < or equal to 50% for > or equal to 5 days Interventions: Hormel Shake, Ensure Enlive (each supplement provides 350kcal and 20 grams of protein), Prostat Body mass index is 15.76 kg/m.  Data Reviewed:   CBC: Recent Labs  Lab 03/26/23 1019  WBC 5.5  HGB 13.5  HCT 40.6  MCV 88.5  PLT 288   Basic Metabolic Panel: Recent Labs  Lab 03/25/23 0603 03/26/23 1019  NA 145 144  K 5.0 4.6  CL 109 113*  CO2 24 24  GLUCOSE 71 89  BUN 13 19  CREATININE 1.12* 1.26*  CALCIUM 9.0 8.7*  MG 2.3 2.2  PHOS 3.4 3.0   GFR: Estimated Creatinine Clearance: 35.3 mL/min (A) (by C-G formula based on SCr of 1.26 mg/dL (H)). Liver Function Tests: No results for input(s): "AST", "ALT", "ALKPHOS", "BILITOT", "PROT", "ALBUMIN" in the last 168 hours. No results for input(s): "LIPASE", "AMYLASE" in the last 168 hours. No results for input(s): "AMMONIA" in the last 168 hours. Coagulation Profile: No results for input(s): "INR", "PROTIME" in the last 168 hours. Cardiac Enzymes: No results for input(s): "CKTOTAL", "CKMB", "CKMBINDEX", "TROPONINI" in the last 168 hours. BNP (last 3 results) No results for input(s): "PROBNP" in the last 8760 hours. HbA1C: No results for input(s): "HGBA1C" in the last 72 hours. CBG: No results for input(s): "GLUCAP" in the last 168 hours. Lipid Profile: No results for input(s): "CHOL", "HDL", "LDLCALC", "TRIG", "CHOLHDL", "LDLDIRECT" in the last 72 hours. Thyroid Function Tests: No results for input(s): "TSH", "T4TOTAL", "FREET4", "T3FREE", "THYROIDAB" in the last 72 hours. Anemia Panel: No results for input(s): "VITAMINB12", "FOLATE", "FERRITIN", "TIBC", "IRON", "RETICCTPCT" in the last 72 hours. Sepsis Labs: No results for input(s): "PROCALCITON", "LATICACIDVEN" in the last 168 hours.  No results found for this or any previous visit (from the past 240  hour(s)).       Radiology Studies: No results found.         LOS: 16 days   Time spent= 35 mins    Miguel Rota, MD [facility]  If 7PM-7AM, please contact night-coverage  03/29/2023, 1:06 PM

## 2023-03-30 NOTE — Progress Notes (Signed)
Nurse at bedside pt alert, moving around in bed. Attempted to feed patient. Patient consumed about 20% of meal. Meds were given in magic cup. No sitter present.

## 2023-03-30 NOTE — Progress Notes (Signed)
   Palliative Medicine Inpatient Follow Up Note HPI: Natalie Brown is a 57 y.o. female with PMH significant for early onset dementia, bipolar affective disorder, catatonia, hypertension, sinus tachycardia, vitamin B12 deficiency, CKD, severe protein calorie malnutrition. Palliative care is involved to support additional goals of care conversations.   Today's Discussion 03/30/2023  *Please note that this is a verbal dictation therefore any spelling or grammatical errors are due to the "[employer] One" system interpretation.  Chart reviewed inclusive of vital signs, progress notes, laboratory results, and diagnostic images.   I met with [name] at bedside this morning. [name] opening her eyes though not interactive.   Per [name]'s nursing technician at bedside she has been eating very little per her report. She has been sleeping for the most part.   [name] per prior evaluation, does not presently meet inpatient hospice criteria.   Questions and concerns addressed/Palliative Support Provided.   Objective Assessment: Vital Signs Vitals:   03/29/23 1610 03/29/23 1921  BP: 99/73 103/72  Pulse: (!) 57 (!) 56  Resp: 12 18  Temp:    SpO2: 99% 100%    Intake/Output Summary (Last 24 hours) at 03/30/2023 1043 Last data filed at 03/30/2023 1610 Gross per 24 hour  Intake 120 ml  Output --  Net 120 ml   Last Weight  Most recent update: 03/30/2023  6:51 AM    Weight  44.6 kg (98 lb 5.2 oz)            Gen: Middle aged AA F in NAD HEENT: Dry mucous membranes CV: Regular rate and rhythm  PULM:  On RA, breathing is even and nonlabored ABD: soft/nontender  EXT: muscle wasting Neuro: Opens eyes does not follow commands  SUMMARY OF RECOMMENDATIONS   DNAR/DNI  PMT will  check in with patient weekly  Time: 25 ______________________________________________________________________________________ [name] Palliative Medicine Team Team Cell Phone:  [phone] Please utilize secure chat with additional questions, if there is no response within 30 minutes please call the above phone number  Palliative Medicine Team providers are available by phone from 7am to 7pm daily and can be reached through the team cell phone.  Should this patient require assistance outside of these hours, please call the patient's attending physician.

## 2023-03-30 NOTE — Plan of Care (Signed)
  Problem: Elimination: Goal: Will not experience complications related to bowel motility Outcome: Progressing Goal: Will not experience complications related to urinary retention Outcome: Progressing   Problem: Safety: Goal: Ability to remain free from injury will improve Outcome: Progressing   Problem: Skin Integrity: Goal: Risk for impaired skin integrity will decrease Outcome: Progressing   

## 2023-03-30 NOTE — Progress Notes (Signed)
PROGRESS NOTE    Natalie Brown  UVO:[id number] DOB: Jan 06, 1966 DOA: 01/22/2023 PCP: Lavinia Sharps, NP    Brief Narrative:   57 y.o. female history significant for early onset dementia, bipolar disorder, catatonia essential hypertension sinus tachycardia B12 deficiency severe protein caloric malnutrition he was in the hospital for more than 400 days discharged on 01/22/2023 only to come back to the ED after 49 days due to agitation.  While in the ED evaluated by psychiatry who made changes to his medication.  Patient is currently a DNR/DNI patient was admitted to try to improve patient environment, circadian rhythm and provide a window which can help improve the patient psychiatric status.  Palliative care is also on board. Difficulty with placement.    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  Further med management per psychiatry, currently on lorazepam 0.5 mg 3 times daily and additional as needed as necessary.  Palliative care team following, currently DNR/DNI.  TOC involved awaiting Medicaid application for long-term placement. Per neurology, unlikely to improve further.   Hypovolemic hypernatremia: Stable.  Periodic checks   Weight loss: Malnutrition Po remains poor, was 72 kg in 2023, 47 kg last month, now 43.5 kg on most recent weight - consider placement of PEG feeding tube if consistent with family's goals of care   Sinus tachycardia Improved   Essential hypertension Not on any meds.  IV as needed   Extensive palliative care meeting, mother considering hospice care.   DVT  prophylaxis: lovenox Family Communication:none Status is: Inpatient Remains inpatient appropriate because: placement issues, TOC working on it  Subjective: No complaints.   Examination:  General exam: No meaningful interaction cachectic frail Respiratory system: Clear to auscultation. Respiratory effort normal. Cardiovascular system: S1 & S2 heard, RRR. No JVD, murmurs, rubs, gallops or clicks. No pedal edema. Gastrointestinal system: Abdomen is nondistended, soft and nontender. No organomegaly or masses felt. Normal bowel sounds heard. Central nervous system unable to assess Skin: No rashes, lesions or ulcers Psychiatry: Poor               Diet Orders (From admission, onward)     Start     Ordered   03/13/23 1330  Diet regular Room service appropriate? Yes; Fluid consistency: Thin  Diet effective now       Question Answer Comment  Room service appropriate? Yes   Fluid consistency: Thin      03/13/23 1330            Objective: Vitals:   03/29/23 0807 03/29/23 1610 03/29/23 1921 03/30/23 0500  BP: 105/64 99/73 103/72   Pulse: 66 (!) 57 (!) 56   Resp: 19 12 18    Temp:      TempSrc:      SpO2:  99% 100%   Weight:    44.6 kg  Height:        Intake/Output Summary (Last 24 hours) at 03/30/2023 0755 Last data filed at 03/29/2023 0900 Gross per 24 hour  Intake 60 ml  Output --  Net 60 ml   Filed Weights   03/26/23 0342 03/29/23 0500 03/30/23 0500  Weight: 44.3 kg 44.3 kg 44.6 kg    Scheduled Meds:  (feeding supplement) PROSource Plus  30 mL Oral BID BM   acetaminophen  1,000 mg Oral TID   enoxaparin (LOVENOX) injection  30 mg Subcutaneous Q24H   feeding supplement  237 mL Oral BID BM   fiber supplement (BANATROL TF)  60 mL Oral BID   LORazepam  0.5 mg Oral TID   multivitamin with minerals  1 tablet Oral Daily   sodium chloride flush  3 mL Intravenous Q12H   Continuous Infusions:  Nutritional status Signs/Symptoms: severe fat depletion, severe  muscle depletion, energy intake < or equal to 50% for > or equal to 5 days Interventions: Hormel Shake, Ensure Enlive (each supplement provides 350kcal and 20 grams of protein), Prostat Body mass index is 15.87 kg/m.  Data Reviewed:   CBC: Recent Labs  Lab 03/26/23 1019  WBC 5.5  HGB 13.5  HCT 40.6  MCV 88.5  PLT 288   Basic Metabolic Panel: Recent Labs  Lab 03/25/23 0603 03/26/23 1019  NA 145 144  K 5.0 4.6  CL 109 113*  CO2 24 24  GLUCOSE 71 89  BUN 13 19  CREATININE 1.12* 1.26*  CALCIUM 9.0 8.7*  MG 2.3 2.2  PHOS 3.4 3.0   GFR: Estimated Creatinine Clearance: 35.5 mL/min (A) (by C-G formula based on SCr of 1.26 mg/dL (H)). Liver Function Tests: No results for input(s): "AST", "ALT", "ALKPHOS", "BILITOT", "PROT", "ALBUMIN" in the last 168 hours. No results for input(s): "LIPASE", "AMYLASE" in the last 168 hours. No results for input(s): "AMMONIA" in the last 168 hours. Coagulation Profile: No results for input(s): "INR", "PROTIME" in the last 168 hours. Cardiac Enzymes: No results for input(s): "CKTOTAL", "CKMB", "CKMBINDEX", "TROPONINI" in the last 168 hours. BNP (last 3 results) No results for input(s): "PROBNP" in the last 8760 hours. HbA1C: No results for input(s): "HGBA1C" in the last 72 hours. CBG: No results for input(s): "GLUCAP" in the last 168 hours. Lipid Profile: No results for input(s): "CHOL", "HDL", "LDLCALC", "TRIG", "CHOLHDL", "LDLDIRECT" in the last 72 hours. Thyroid Function Tests: No results for input(s): "TSH", "T4TOTAL", "FREET4", "T3FREE", "THYROIDAB" in the last 72 hours. Anemia Panel: No results for input(s): "VITAMINB12", "FOLATE", "FERRITIN", "TIBC", "IRON", "RETICCTPCT" in the last 72 hours. Sepsis Labs: No results for input(s): "PROCALCITON", "LATICACIDVEN" in the last 168 hours.  No results found for this or any previous visit (from the past 240 hour(s)).       Radiology Studies: No results found.         LOS: 55  days   Time spent= 35 mins    Miguel Rota, MD [facility]  If 7PM-7AM, please contact night-coverage  03/30/2023, 7:55 AM

## 2023-03-31 NOTE — Progress Notes (Signed)
PROGRESS NOTE    Natalie Brown  NGE:[id number] DOB: 09/10/1965 DOA: 01/22/2023 PCP: Lavinia Sharps, NP    Brief Narrative:   57 y.o. female history significant for early onset dementia, bipolar disorder, catatonia essential hypertension sinus tachycardia B12 deficiency severe protein caloric malnutrition he was in the hospital for more than 400 days discharged on 01/22/2023 only to come back to the ED after 49 days due to agitation.  While in the ED evaluated by psychiatry who made changes to his medication.  Patient is currently a DNR/DNI patient was admitted to try to improve patient environment, circadian rhythm and provide a window which can help improve the patient psychiatric status.  Palliative care is also on board. Difficulty with placement.    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  Further med management per psychiatry, currently on lorazepam 0.5 mg 3 times daily and additional as needed as necessary.  Palliative care team following, currently DNR/DNI.  TOC involved awaiting Medicaid application for long-term placement. Per neurology, unlikely to improve further.   Hypovolemic hypernatremia: Stable.  Can do periodic lab work   Weight loss: Malnutrition Significant weight loss with poor oral intake. - consider placement of PEG feeding tube if consistent with family's goals of care   Sinus tachycardia Improved   Essential hypertension Not on any meds.  IV as needed   Extensive palliative care meeting, mother considering hospice care.   DVT prophylaxis: lovenox Family  Communication:none Status is: Inpatient Remains inpatient appropriate because: placement issues, TOC working on it  Subjective: No acute events overnight Ate her breakfast with help of nursing to  Examination:  General exam: No meaningful interaction cachectic frail Respiratory system: Clear to auscultation. Respiratory effort normal. Cardiovascular system: S1 & S2 heard, RRR. No JVD, murmurs, rubs, gallops or clicks. No pedal edema. Gastrointestinal system: Abdomen is nondistended, soft and nontender. No organomegaly or masses felt. Normal bowel sounds heard. Central nervous system unable to assess Skin: No rashes, lesions or ulcers Psychiatry: Poor               Diet Orders (From admission, onward)     Start     Ordered   03/13/23 1330  Diet regular Room service appropriate? Yes; Fluid consistency: Thin  Diet effective now       Question Answer Comment  Room service appropriate? Yes   Fluid consistency: Thin      03/13/23 1330            Objective: Vitals:   03/30/23 0500 03/30/23 1800 03/30/23 1937 03/31/23 0615  BP:  104/72 96/70   Pulse:  80 96   Resp:      Temp:      TempSrc:      SpO2:      Weight: 44.6 kg   41.8 kg  Height:       No intake or output data in the 24 hours ending 03/31/23 1158 Filed Weights   03/29/23 0500 03/30/23 0500 03/31/23 0615  Weight: 44.3 kg 44.6 kg 41.8 kg    Scheduled Meds:  (feeding supplement) PROSource Plus  30 mL Oral  BID BM   acetaminophen  1,000 mg Oral TID   enoxaparin (LOVENOX) injection  30 mg Subcutaneous Q24H   feeding supplement  237 mL Oral BID BM   fiber supplement (BANATROL TF)  60 mL Oral BID   LORazepam  0.5 mg Oral TID   multivitamin with minerals  1 tablet Oral Daily   sodium chloride flush  3 mL Intravenous Q12H   Continuous Infusions:  Nutritional status Signs/Symptoms: severe fat depletion, severe muscle depletion, energy intake < or equal to 50% for > or equal to 5 days Interventions:  Hormel Shake, Ensure Enlive (each supplement provides 350kcal and 20 grams of protein), Prostat Body mass index is 14.87 kg/m.  Data Reviewed:   CBC: Recent Labs  Lab 03/26/23 1019  WBC 5.5  HGB 13.5  HCT 40.6  MCV 88.5  PLT 288   Basic Metabolic Panel: Recent Labs  Lab 03/25/23 0603 03/26/23 1019  NA 145 144  K 5.0 4.6  CL 109 113*  CO2 24 24  GLUCOSE 71 89  BUN 13 19  CREATININE 1.12* 1.26*  CALCIUM 9.0 8.7*  MG 2.3 2.2  PHOS 3.4 3.0   GFR: Estimated Creatinine Clearance: 33.3 mL/min (A) (by C-G formula based on SCr of 1.26 mg/dL (H)). Liver Function Tests: No results for input(s): "AST", "ALT", "ALKPHOS", "BILITOT", "PROT", "ALBUMIN" in the last 168 hours. No results for input(s): "LIPASE", "AMYLASE" in the last 168 hours. No results for input(s): "AMMONIA" in the last 168 hours. Coagulation Profile: No results for input(s): "INR", "PROTIME" in the last 168 hours. Cardiac Enzymes: No results for input(s): "CKTOTAL", "CKMB", "CKMBINDEX", "TROPONINI" in the last 168 hours. BNP (last 3 results) No results for input(s): "PROBNP" in the last 8760 hours. HbA1C: No results for input(s): "HGBA1C" in the last 72 hours. CBG: No results for input(s): "GLUCAP" in the last 168 hours. Lipid Profile: No results for input(s): "CHOL", "HDL", "LDLCALC", "TRIG", "CHOLHDL", "LDLDIRECT" in the last 72 hours. Thyroid Function Tests: No results for input(s): "TSH", "T4TOTAL", "FREET4", "T3FREE", "THYROIDAB" in the last 72 hours. Anemia Panel: No results for input(s): "VITAMINB12", "FOLATE", "FERRITIN", "TIBC", "IRON", "RETICCTPCT" in the last 72 hours. Sepsis Labs: No results for input(s): "PROCALCITON", "LATICACIDVEN" in the last 168 hours.  No results found for this or any previous visit (from the past 240 hour(s)).       Radiology Studies: No results found.         LOS: 18 days   Time spent= 35 mins    Miguel Rota, MD [facility]  If 7PM-7AM,  please contact night-coverage  03/31/2023, 11:58 AM

## 2023-03-31 NOTE — TOC Progression Note (Signed)
Transition of Care [facility]) - Progression Note    Patient Details  Name: Natalie Brown MRN: [id number] Date of Birth: 1966-01-12  Transition of Care [facility]) CM/SW Contact  Reni Hausner A [place], [place] Phone Number: 03/31/2023, 10:25 AM  Clinical Narrative:     No current bed offers for long term care placement.  TOC will continue to follow.         Expected Discharge Plan and Services                                               Social Determinants of Health (SDOH) Interventions SDOH Screenings   Food Insecurity: No Food Insecurity (01/24/2023)  Housing: Low Risk  (01/24/2023)  Transportation Needs: Patient Unable To Answer (01/24/2023)  Utilities: Not At Risk (01/24/2023)  Alcohol Screen: Low Risk  (09/06/2021)  Depression (PHQ2-9): Medium Risk (05/26/2020)  Tobacco Use: High Risk (02/08/2023)    Readmission Risk Interventions     No data to display

## 2023-03-31 NOTE — Progress Notes (Signed)
Patient restless/ agiated most of the night, appeared to slept only 45 minutes to an hour at time maybe for a total of 3 hours. Staff had to be present at bedside most of the night. Patient had several attempts of "wiggling" out of bed. Sitter note present all other fall precautions in place Prn Ativan was given once.

## 2023-04-01 NOTE — Plan of Care (Signed)

## 2023-04-01 NOTE — Progress Notes (Signed)
PROGRESS NOTE    Natalie Brown  HQI:[id number] DOB: Sep 18, 1965 DOA: 01/22/2023 PCP: Lavinia Sharps, NP    Brief Narrative:   57 y.o. female history significant for early onset dementia, bipolar disorder, catatonia essential hypertension sinus tachycardia B12 deficiency severe protein caloric malnutrition he was in the hospital for more than 400 days discharged on 01/22/2023 only to come back to the ED after 49 days due to agitation.  While in the ED evaluated by psychiatry who made changes to his medication.  Patient is currently a DNR/DNI patient was admitted to try to improve patient environment, circadian rhythm and provide a window which can help improve the patient psychiatric status.  Palliative care is also on board. Difficulty with placement.  Patient has been made comfort care.   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  Further med management per psychiatry, currently on lorazepam 0.5 mg 3 times daily and additional as needed as necessary.  Palliative care team following, currently DNR/DNI.  TOC, psychiatry, palliative care and myself met with the mother.  Patient has been transition to comfort care.  Difficult placement   Hypovolemic hypernatremia: Stable.  Can do periodic lab work   Weight loss: Malnutrition Significant weight loss with poor oral intake. - consider placement of PEG feeding tube if consistent with family's goals of care   Sinus tachycardia Improved   Essential hypertension Not on any meds.  IV as needed   Patient is comfort care but difficult to place   DVT  prophylaxis: lovenox Family Communication:none Status is: Inpatient Remains inpatient appropriate because: placement issues, TOC working on it  Subjective: No acute events overnight  Examination:  General exam: No meaningful interaction cachectic frail Respiratory system: Clear to auscultation. Respiratory effort normal. Cardiovascular system: S1 & S2 heard, RRR. No JVD, murmurs, rubs, gallops or clicks. No pedal edema. Gastrointestinal system: Abdomen is nondistended, soft and nontender. No organomegaly or masses felt. Normal bowel sounds heard. Central nervous system unable to assess Skin: No rashes, lesions or ulcers Psychiatry: Poor               Diet Orders (From admission, onward)     Start     Ordered   03/13/23 1330  Diet regular Room service appropriate? Yes; Fluid consistency: Thin  Diet effective now       Question Answer Comment  Room service appropriate? Yes   Fluid consistency: Thin      03/13/23 1330            Objective: Vitals:   03/30/23 1937 03/31/23 0615 03/31/23 2028 04/01/23 0531  BP: 96/70  96/73 (!) 86/59  Pulse: 96  75 65  Resp:   18 17  Temp:   97.9 F (36.6 C) 97.8 F (36.6 C)  TempSrc:      SpO2:   100% 100%  Weight:  41.8 kg    Height:        Intake/Output Summary (Last 24 hours) at 04/01/2023 1321 Last data filed at 04/01/2023 1136 Gross per 24 hour  Intake 1077 ml  Output --  Net 1077 ml   Ceasar Mons  Weights   03/29/23 0500 03/30/23 0500 03/31/23 0615  Weight: 44.3 kg 44.6 kg 41.8 kg    Scheduled Meds:  (feeding supplement) PROSource Plus  30 mL Oral BID BM   acetaminophen  1,000 mg Oral TID   enoxaparin (LOVENOX) injection  30 mg Subcutaneous Q24H   feeding supplement  237 mL Oral BID BM   LORazepam  0.5 mg Oral TID   multivitamin with minerals  1 tablet Oral Daily   sodium chloride flush  3 mL Intravenous Q12H   Continuous Infusions:  Nutritional status Signs/Symptoms: severe fat depletion, severe muscle  depletion, energy intake < or equal to 50% for > or equal to 5 days Interventions: Hormel Shake, Ensure Enlive (each supplement provides 350kcal and 20 grams of protein), Prostat Body mass index is 14.87 kg/m.  Data Reviewed:   CBC: Recent Labs  Lab 03/26/23 1019  WBC 5.5  HGB 13.5  HCT 40.6  MCV 88.5  PLT 288   Basic Metabolic Panel: Recent Labs  Lab 03/26/23 1019  NA 144  K 4.6  CL 113*  CO2 24  GLUCOSE 89  BUN 19  CREATININE 1.26*  CALCIUM 8.7*  MG 2.2  PHOS 3.0   GFR: Estimated Creatinine Clearance: 33.3 mL/min (A) (by C-G formula based on SCr of 1.26 mg/dL (H)). Liver Function Tests: No results for input(s): "AST", "ALT", "ALKPHOS", "BILITOT", "PROT", "ALBUMIN" in the last 168 hours. No results for input(s): "LIPASE", "AMYLASE" in the last 168 hours. No results for input(s): "AMMONIA" in the last 168 hours. Coagulation Profile: No results for input(s): "INR", "PROTIME" in the last 168 hours. Cardiac Enzymes: No results for input(s): "CKTOTAL", "CKMB", "CKMBINDEX", "TROPONINI" in the last 168 hours. BNP (last 3 results) No results for input(s): "PROBNP" in the last 8760 hours. HbA1C: No results for input(s): "HGBA1C" in the last 72 hours. CBG: No results for input(s): "GLUCAP" in the last 168 hours. Lipid Profile: No results for input(s): "CHOL", "HDL", "LDLCALC", "TRIG", "CHOLHDL", "LDLDIRECT" in the last 72 hours. Thyroid Function Tests: No results for input(s): "TSH", "T4TOTAL", "FREET4", "T3FREE", "THYROIDAB" in the last 72 hours. Anemia Panel: No results for input(s): "VITAMINB12", "FOLATE", "FERRITIN", "TIBC", "IRON", "RETICCTPCT" in the last 72 hours. Sepsis Labs: No results for input(s): "PROCALCITON", "LATICACIDVEN" in the last 168 hours.  No results found for this or any previous visit (from the past 240 hour(s)).       Radiology Studies: No results found.         LOS: 19 days   Time spent= 35 mins    Miguel Rota, MD [facility]  If 7PM-7AM, please contact night-coverage  04/01/2023, 1:21 PM

## 2023-04-01 NOTE — Progress Notes (Signed)
Nutrition Follow-up  DOCUMENTATION CODES:   Severe malnutrition in context of chronic illness, Underweight  INTERVENTION:  -Continue regular diet and promote PO intake  - Continue Ensure Plus High Protein po BID, each supplement provides 350 kcal and 20 grams of protein. (Pt only likes strawberry) - Continue Prosource Plus BID and MVI with minerals daily -Continue Magic cup TID with meals, each supplement provides 290 kcal and 9 grams of protein - Discontinue Banatrol 60 mL BID   NUTRITION DIAGNOSIS:   Severe Malnutrition related to chronic illness as evidenced by severe fat depletion, severe muscle depletion, energy intake < or equal to 50% for > or equal to 5 days. Continued to be valid with interventions in place.    GOAL:   Patient will meet greater than or equal to 90% of their needs    MONITOR:   PO intake, Supplement acceptance, Weight trends  REASON FOR ASSESSMENT:   Consult Assessment of nutrition requirement/status  ASSESSMENT:   57 y.o. F, that has been with [facility] since 12-24-21. Has been in ED or 49 days. Was admitted to [facility] 03/13/2023 for early onset dementia with behavioral disturbance. PMH; HTN, bipolar 1 disorder with mania, catatonia, dementia.  Pt appears to have had a decline in oral intake over the last few days, Prior to the 7th, Pt was consuming 80-90% of her meals with assistance required.  Patient no verbal at time of visit. No sitter in room.  RN giving medication, pt  will open mouth to take in food or drink but scoots and rocks around bed while being fed or drinking.  RN stated that she is eating well ,  Will spit out banatrol. Enjoys the Ensure.  She reported that they had just had a finished cleaning her up from having a BM. Last recorded BM 12/30. RN reached out to. Pt unable to answer this question.   NUTRITION - FOCUSED PHYSICAL EXAM:  Flowsheet Row Most Recent Value  Orbital Region Severe depletion  Upper Arm Region Severe  depletion  Thoracic and Lumbar Region Unable to assess  Buccal Region Moderate depletion  Temple Region Severe depletion  Clavicle Bone Region Severe depletion  Clavicle and Acromion Bone Region Severe depletion  Scapular Bone Region Unable to assess  Dorsal Hand Unable to assess  Patellar Region Moderate depletion  Anterior Thigh Region Moderate depletion  Posterior Calf Region Moderate depletion  Edema (RD Assessment) Mild  Hair Reviewed  Eyes Unable to assess  Mouth Reviewed  Skin Reviewed  Nails Reviewed       Diet Order:   Diet Order             Diet regular Room service appropriate? Yes; Fluid consistency: Thin  Diet effective now                   EDUCATION NEEDS:   Not appropriate for education at this time  Skin:  Skin Assessment: Reviewed RN Assessment  Last BM:  03/20/23: type 7  Height:   Ht Readings from Last 1 Encounters:  01/22/23 5\' 6"  (1.676 m)    Weight:   Wt Readings from Last 1 Encounters:  03/31/23 41.8 kg    Ideal Body Weight:  59.1 kg  BMI:  Body mass index is 14.87 kg/m.  Estimated Nutritional Needs:   Kcal:  1600-1800  Protein:  60-80 gm  Fluid:  >1.6 L    Jamelle Haring RDN, LDN Clinical Dietitian  Pleas see Amion for contact information

## 2023-04-02 DIAGNOSIS — R45851 Suicidal ideations: Secondary | ICD-10-CM

## 2023-04-02 DIAGNOSIS — F3110 Bipolar disorder, current episode manic without psychotic features, unspecified: Secondary | ICD-10-CM | POA: Diagnosis not present

## 2023-04-02 MED ORDER — MEMANTINE HCL 5 MG PO TABS
2.5000 mg | ORAL_TABLET | Freq: Every day | ORAL | Status: DC
  Administered 2023-04-02 – 2023-04-27 (×22): 2.5 mg via ORAL
  Filled 2023-04-02 (×27): qty 1

## 2023-04-02 NOTE — TOC Progression Note (Signed)
Transition of Care [facility]) - Progression Note    Patient Details  Name: Natalie Brown MRN: [id number] Date of Birth: [birth year]  Transition of Care [facility]) CM/SW Contact  Janae Bridgeman, RN Phone Number: 04/02/2023, 12:15 PM  Clinical Narrative:    No bed offers are available to the patient for safe disposition and the patient's mother is unable to take the patient home for care.   [facility] declined Inpatient Hospice placement to the patient recently.  CM and MSW with [facility] Team continues to follow the patient for TOC needs.  Patient has difficult placement issues with no bed offers for LTC placement.    Hospital leadership is aware of barriers to discharge and no bed offers for LTC placement.        Expected Discharge Plan and Services                                               Social Determinants of Health (SDOH) Interventions SDOH Screenings   Food Insecurity: No Food Insecurity (01/24/2023)  Housing: Low Risk  (01/24/2023)  Transportation Needs: Patient Unable To Answer (01/24/2023)  Utilities: Not At Risk (01/24/2023)  Alcohol Screen: Low Risk  (09/06/2021)  Depression (PHQ2-9): Medium Risk (05/26/2020)  Tobacco Use: High Risk (02/08/2023)    Readmission Risk Interventions     No data to display

## 2023-04-02 NOTE — Progress Notes (Signed)
  Progress Note   Patient: Natalie Brown XBJ:[id number] DOB: January 04, 1966 DOA: 01/22/2023     20 DOS: the patient was seen and examined on 04/02/2023   Brief hospital course:  57 y.o. female history significant for early onset dementia, bipolar disorder, catatonia essential hypertension sinus tachycardia B12 deficiency severe protein caloric malnutrition he was in the hospital for more than 400 days discharged on 01/22/2023 only to come back to the ED after 49 days due to agitation.  While in the ED evaluated by psychiatry who made changes to his medication.  Patient is currently a DNR/DNI patient was admitted to try to improve patient environment, circadian rhythm and provide a window which can help improve the patient psychiatric status.  Palliative care is also on board. Difficulty with placement.  Patient has been made comfort care.  Assessment and Plan: 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)/bipolar disorder/possible catatonia Seen by neurology-no further recommendations.  Further med management per psychiatry, currently on lorazepam 0.5 mg 3 times daily and additional as needed as necessary.  Palliative care team following, currently DNR/DNI.  TOC, psychiatry, palliative care following.   -Patient has been transition to comfort care. Placement remains pending   Hypovolemic hypernatremia: Stable.  Can do periodic lab work   Weight loss: Malnutrition Significant weight loss with poor oral intake. - per 11/20 Palliative Care documentation, no feeding tube - Comfort measures   Sinus tachycardia Improved   Essential hypertension Not on any meds.  IV as needed      Subjective: Unable to assess given mentation  Physical Exam: Vitals:   03/31/23 2028 04/01/23 0531 04/01/23 2012 04/02/23 0446  BP:  (!) 86/59 (!) 79/52 (!) 122/103  Pulse: 75 65 76 77  Resp: 18 17 18 18   Temp: 97.9 F (36.6 C) 97.8 F (36.6 C) 98.1 F (36.7 C) 97.7 F (36.5 C)  TempSrc:   Oral Oral   SpO2: 100% 100% 100% 100%  Weight:      Height:       General exam: Awake, laying in bed, in nad Respiratory system: Normal respiratory effort, no wheezing Cardiovascular system: regular rate, s1, s2 Gastrointestinal system: Soft, nondistended, positive BS Central nervous system: CN2-12 grossly intact, strength intact Extremities: Perfused, no clubbing Skin: Normal skin turgor, no notable skin lesions seen Psychiatry: unable to assess given mentation  Data Reviewed:  There are no new results to review at this time.  Family Communication: Pt in room, family not at bedside  Disposition: Status is: Inpatient Remains inpatient appropriate because: severity of illness  Planned Discharge Destination:  Unclear at this time    Author: Rickey Barbara, MD 04/02/2023 4:09 PM  For on call review [email or web address].

## 2023-04-02 NOTE — Consult Note (Signed)
[facility] Psychiatry Followup Face-to-Face Psychiatric Evaluation   Name: Natalie Brown DOB: 05-22-1965 MRN: [id number] Service Date: April 02, 2023 LOS:  LOS: 20 days  Reason for Consult: sedation Referring Provider: None - no consult order placed   Assessment  Natalie Brown is a 57 y.o. female admitted medically for 01/22/2023  3:31 PM for possible catatonia/altered mental status. She carries the psychiatric diagnoses of bipolar affective disorder with psychotic symptoms and has a past medical history of hypertension. This is the first time the psychiatry team has seen her this hotpitalization.   This patient has had an unfortunate, rapid decline over the year+. She was initially admitted to the behavioral health hospital after decompensating with hx bipolar I disorder (was sleeping outside in the rain, confused, issues remaining on task) in 08/2021. Her psychosis and mania stabilized after about 4 weeks of admission w/ multiple medication trials (Zyprexa, haldol, and Invega). This admission revealed a significant and rapidly progressive dementia as despite improvement in psychiatric symptoms, she never returned to baseline. She had a prolonged admission to behavioral health due to no safe discharge options and was transferred to the hospital in 12/2021 after 4 months after developing delirium/encephalopathy while admitted to [facility]. She was admitted to [facility] from 12/2021 to 01/2023; was discharged ot a care facility and came back to the ED within <24 hours due to agitation. Over the past year, she has had periods of becoming almost totally nonverbal with staff (more likely to interact with family) however has not been overtly manic or psychotic during that time span; dementia dx and sx have largely superceded psychiatric clinical features. Psychiatry consult team has been involved intermittently in her care over the last year (saw at admission 12/2021 when [name] regimen continued, in  06/2022, in 10/2022, in 12/2022 and a few times in the ED in 02/2023. Briefly, changes made by psychiatry team included tapering off of haloperidol d/t rigidity/dystonia - attempted to transition to paliperidone however mother/guardian did not pick up the phone and eventual diagnosis of comorbid catatonia an starting lorazepam with good clinical response (began walking, talking, eating). She had previously regressed with increase of olanzapine which was tapered off; at some point was restarted on 2.5 mg at bedtime. Her psychiatric medications at time of consult are listed below. We are not charging for this encounter as no consult order placed.   On evaluation 11/29 pt showed mild improvement. She was awake and aware of examiner. She was hesitant to allow physical exam - at first quite rigid (clearly volitional) but after about the same as yesterday. Only allowed exam of one of her upper extremities. Continue to recommend medical w/u of AMS in demented pt such as CXR, u/a, etc.  Feel risk >>>> benefit of formal ativan trial at this time (encephalopathy due to medical cause or just progression of terminal dementia  remains most likely driver of AMS). On chart review seems to be more interactive and has started eating again; hopefully sodium will self-regulate. Discussed low BP w/ primary. Feel benefits of ativan still outweigh risks (do not need w/d).   On 12/3, participated in palliative care family meeting - mom open to hospice w/o life sustaining care. Pt given 1 addl mg oral ativan to test response - became more alert but not more interactive, overall poor response. Have asked nursing to reach out if there is tangible benefit like pt eating, vocalizing, etc after increased dose otherwise will leave at 0.5 TID.   On  12/5; the patient briefly reduced medications but this morning (12/6), she has been taking PO.  She has been more sedated however; did not do Ativan challenge.   ON 12/11, mother consented to  brief trial memantine.   PLEASE CALL MOTHER PRIOR TO ALL NON-EMERGENT MED CHANGES Psychiatry team is following; will see 1-2x/week.  Current sx moslty 2/2 dementia. Further management per primary.  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   ## Medications:  Continue with lorazepam as main therapy (not ideal in dementia, but pt w/ hx of catatonia and multiple adverse events w/ neuroleptics.  -- start memantine 2.5 mg daily  Continue  lorazepam 0.5 mg TID catatonia  OK to hold for sedation; would like her to get at least one-two doses a day (w/d risk) Continue lorazepam 0.5 mg TID PRN anxiety/agitation (prior catatonia) Continue  lorazepam 1 mg IM PRN agitation only ---> changed to 0.5 mg We could consider adding Memantine as an adjunct for catatonia; mom has to be informed before adding.    Mom informed of these changes.   -- prior propranolol d/c 03/2022, atropine dc 01/2022, haldol dc sometime in 2023 and again 03/20/2023, zyprexa dc 03/20/23  ## Medical Decision Making Capacity:  Patient has interim legal guardian, mother [name] [phone]) as she is incompetent.   ## Further Work-up:  -- most recent EKG on 02/08/2023  had QtC of 432  in setting of tachycardia -- last head imaging 10/3 with pronounced cerebral atrophy.  -- lipid/A1c don't seem to have run, however, now off antipsychotics.   -- Pertinent labwork reviewed earlier this admission  includes:  Baseline GFR decreased (now seems to be about 1.3). Hypernatremia/  Ammonia most recently WNL  ## Disposition:  --Recommend memory care unit if possible but defer to SW  ## Behavioral / Environmental:  --  delirium precautions     ##Legal Status Patient with legal guardian  Thank you for this consult request. Recommendations have been communicated to the primary team.   Psychiatry will continue to follow.  Natalie Brown   NEW history  Relevant Aspects of Hospital Course:  Admitted on 01/22/2023 to normalize circadian rhythm after decompensating in >1 mo ED stay. Last received PRN antipsychotics 11/25. Had gotten PRN hydroxyzine about once a day.   Patient Report:  Unable to obtain report as patient is nonverbal. Curled up in bed but alert. A little more rigid than I have experienced (had not goten AM ativan).   Spoke to nurse - pt no longer experiencing paradoxical response (ie no longer more alert after administration) but it does still help with agitation and no real change in confusion afterwards. Has eaten 75% of meals for 3 days.   Messaged primary re: stopping ativan if agitated.   Collateral information: Called mom/guardain [name] and discussed r/b/se of ativan; discussed it will be stopped if agitated - consented to brief trial.  Psychiatric History:  Information collected from chart - pt unable to provide details  Bipolar affective disorder with psychotic features, MNCD Admitted to [facility] since Sep 06, 2021  Family psych history: Patient reports mental illness runs in her father's side of the family.   Social History:  Tobacco use: Half PPD; no use since admission Alcohol use: No use since admission Drug use: Denies; UDS negative on admission in May  Family History:  No pertinent FHx  Medical History: Past Medical History:  Diagnosis Date   Bipolar 1 disorder with moderate mania (HCC) 12/30/2013   Hypertension     Surgical  History: History reviewed. No pertinent surgical history.  Medications:   Current Facility-Administered Medications:    (feeding supplement) PROSource Plus liquid 30 mL, 30 mL, Oral, BID BM, Synetta Fail, MD, 30 mL at 03/31/23 1130   acetaminophen (TYLENOL) tablet 650 mg, 650 mg, Oral, Q6H PRN **OR** acetaminophen (TYLENOL) suppository 650 mg, 650 mg, Rectal, Q6H PRN, Synetta Fail, MD   acetaminophen (TYLENOL) tablet 1,000 mg, 1,000 mg, Oral, TID, Synetta Fail, MD, 1,000 mg at 04/01/23 2242   enoxaparin (LOVENOX) injection 30 mg, 30 mg, Subcutaneous, Q24H, Synetta Fail, MD, 30 mg at 04/01/23 2243   feeding supplement (ENSURE ENLIVE / ENSURE PLUS) liquid 237 mL, 237 mL, Oral, BID BM, Synetta Fail, MD, 237 mL at 04/01/23 1739   guaiFENesin (ROBITUSSIN) 100 MG/5ML liquid 5 mL, 5 mL, Oral, Q4H PRN, Amin, Ankit C, MD   hydrALAZINE (APRESOLINE) injection 10 mg, 10 mg, Intravenous, Q4H PRN, Amin, Ankit C, MD   lactulose (CHRONULAC) 10 GM/15ML solution 20 g, 20 g, Oral, BID PRN, Synetta Fail, MD   LORazepam (ATIVAN) 2 MG/ML concentrated solution 0.5 mg, 0.5 mg, Oral, TID, Marinda Elk, MD, 0.5 mg at 04/01/23 2243   LORazepam (ATIVAN) injection 1 mg, 1 mg, Intramuscular, Q6H PRN, Janalyn Shy, Subrina, MD, 1 mg at 04/02/23 0007   metoprolol tartrate (LOPRESSOR) injection 5 mg, 5 mg, Intravenous, Q4H PRN, Amin, Ankit C, MD   multivitamin with minerals tablet 1 tablet, 1 tablet, Oral, Daily, Synetta Fail, MD, 1 tablet at 04/01/23 1135   ondansetron (ZOFRAN) injection 4 mg, 4 mg, Intravenous, Q6H PRN, Amin, Ankit C, MD   senna-docusate (Senokot-S) tablet 1 tablet, 1 tablet, Oral, QHS PRN, Amin, Ankit C, MD   sodium chloride flush (NS) 0.9 % injection 3 mL, 3 mL, Intravenous, Q12H, Synetta Fail, MD, 3 mL at 03/27/23 0921   traZODone (DESYREL) tablet 50 mg, 50 mg, Oral, QHS PRN, Amin, Ankit C, MD, 50 mg at 03/30/23 2029  Allergies: Allergies   Allergen Reactions   Haloperidol And Related     Dystonia, catatonia.    Penicillins Itching       Objective  Vital signs:  Temp:  [97.7 F (36.5 C)-98.1 F (36.7 C)] 97.7 F (36.5 C) (12/11 0446) Pulse Rate:  [76-77] 77 (12/11 0446) Resp:  [18] 18 (12/11 0446) BP: (79-122)/(52-103) 122/103 (12/11 0446) SpO2:  [100 %] 100 % (12/11 0446)  Mental Status Exam: Natalie Brown a 57 year old AA female, in her bed with legs usually contracted under her. She appeared slightly younger than his stated age and appears extremely malnourished. During the interview, she was observed to move around a little less in bed than previously. Eyes open on later eval but not tracking examiner. Reluctant to cooperate in physical exam and did not follow commands.  She did not attempt to vocalize. Impossible to judge  insight/judgment/attention/orientation but assume lacking/poor. She did not appear to be responding to internal stimuli.    Physical Exam: Physical Exam Vitals reviewed.  Constitutional:      General: [name] not in acute distress.    Appearance: [name] not toxic-appearing.     Comments: thin  HENT:     Head: Normocephalic and atraumatic.  Pulmonary:     Effort: Pulmonary effort is normal.  Neurological:     General: No focal deficit present.    Blood pressure (!) 122/103, pulse 77, temperature 97.7 F (36.5 C), temperature source Oral, resp. rate 18, height 5\' 6"  (1.676 m), weight 41.8 kg, SpO2 100%. Body mass index is 14.87 kg/m.

## 2023-04-03 DIAGNOSIS — F3110 Bipolar disorder, current episode manic without psychotic features, unspecified: Secondary | ICD-10-CM | POA: Diagnosis not present

## 2023-04-03 NOTE — Plan of Care (Signed)

## 2023-04-03 NOTE — Progress Notes (Addendum)
  Progress Note   Patient: Natalie Brown [id number]:[id number] DOB: 1965-11-15 DOA: 01/22/2023     21 DOS: the patient was seen and examined on 04/03/2023   Brief hospital course:  57 y.o. female history significant for early onset dementia, bipolar disorder, catatonia essential hypertension sinus tachycardia B12 deficiency severe protein caloric malnutrition he was in the hospital for more than 400 days discharged on 01/22/2023 only to come back to the ED after 49 days due to agitation.  While in the ED evaluated by psychiatry who made changes to his medication.  Patient is currently a DNR/DNI patient was admitted to try to improve patient environment, circadian rhythm and provide a window which can help improve the patient psychiatric status.  Palliative care is also on board. Difficulty with placement.  Patient has been made comfort care.  Assessment and Plan: 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)/bipolar disorder/possible catatonia Seen by neurology-no further recommendations.  Further med management per psychiatry, currently on lorazepam 0.5 mg 3 times daily and additional as needed as necessary.  Memantine 2.5mg  daily started per psychiatry  Palliative care team following, currently DNR/DNI.  TOC, psychiatry, palliative care following.   -Patient has been transition to comfort care. Placement remains pending   Hypovolemic hypernatremia: Stable.  Can do periodic lab work   Weight loss: Malnutrition Significant weight loss with poor oral intake. - per 11/20 Palliative Care documentation, no feeding tube - Comfort measures   Sinus tachycardia Improved   Essential hypertension Not on any meds.  IV as needed      Subjective: Cannot assess given mentation  Physical Exam: Vitals:   04/03/23 0805 04/03/23 0900 04/03/23 1100 04/03/23 1540  BP: (!) 89/61  (!) 85/60 (!) 95/52  Pulse: 72   78  Resp: 18   18  Temp: 98.1 F (36.7 C)   97.7 F (36.5 C)  TempSrc: Oral    Oral  SpO2: (!) 88% 90%  100%  Weight:      Height:       General exam: Not conversant, in no acute distress Respiratory system: normal chest rise, clear, no audible wheezing Cardiovascular system: regular rhythm, s1-s2 Gastrointestinal system: Nondistended, nontender, pos BS Central nervous system: No seizures, no tremors Extremities: No cyanosis, no joint deformities Skin: No rashes, no pallor Psychiatry: unable to assess given mentation  Data Reviewed:  There are no new results to review at this time.  Family Communication: Pt in room, family at bedside  Disposition: Status is: Inpatient Remains inpatient appropriate because: severity of illness  Planned Discharge Destination:  Unclear at this time    Author: Rickey Barbara, MD 04/03/2023 5:18 PM  For on call review [email or web address].

## 2023-04-03 NOTE — Consult Note (Signed)
Attempted to see patient today.  She was laying in bed sleeping.  Sitter is at bedside and notes that patient is exhausted from her morning hygiene.  She states that mother was present to assist with patient care at that time.  Patient was slightly agitated with her mother's presence, but this may be more related to the fact that they were completing full bed bath.  Sitter states that patient ate 100% of her oatmeal, 100% of her sausage, and greater than 90% of her pancake at breakfast.  She has not yet received her lunch.  There have been no behavioral concerns today.  No medication changes recommended today. Psychiatry will continue to see.  Mariel Craft, MD

## 2023-04-04 ENCOUNTER — Encounter (HOSPITAL_COMMUNITY): Payer: Self-pay | Admitting: Internal Medicine

## 2023-04-04 NOTE — Progress Notes (Signed)
  Progress Note   Patient: Natalie Brown [id number]:[id number] DOB: 28-Mar-1966 DOA: 01/22/2023     22 DOS: the patient was seen and examined on 04/04/2023   Brief hospital course:  57 y.o. female history significant for early onset dementia, bipolar disorder, catatonia essential hypertension sinus tachycardia B12 deficiency severe protein caloric malnutrition he was in the hospital for more than 400 days discharged on 01/22/2023 only to come back to the ED after 49 days due to agitation.  While in the ED evaluated by psychiatry who made changes to his medication.  Patient is currently a DNR/DNI patient was admitted to try to improve patient environment, circadian rhythm and provide a window which can help improve the patient psychiatric status.  Palliative care is also on board. Difficulty with placement.  Patient has been made comfort care.  Assessment and Plan: 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)/bipolar disorder/possible catatonia Seen by neurology-no further recommendations.  Further med management per psychiatry, currently on lorazepam 0.5 mg 3 times daily and additional as needed as necessary.  Memantine 2.5mg  daily started per psychiatry  Palliative care team following, currently DNR/DNI.  TOC, psychiatry, palliative care following.   -Patient now on to comfort care. Placement remains pending   Hypovolemic hypernatremia: Stable.  Can do periodic lab work   Weight loss: Malnutrition Significant weight loss with poor oral intake. - per 11/20 Palliative Care documentation, no feeding tube - Comfort measures   Sinus tachycardia Improved   Essential hypertension Not on any meds.  IV as needed      Subjective: Difficult to assess given mentation  Physical Exam: Vitals:   04/03/23 2016 04/04/23 0618 04/04/23 0624 04/04/23 0818  BP: (!) 80/56  94/63 (!) 98/56  Pulse: (!) 57  62 81  Resp: 18  18   Temp: (!) 97.5 F (36.4 C)     TempSrc: Oral     SpO2: 100%  95%    Weight:  44.3 kg    Height:       General exam: Awake, laying in bed, in nad Respiratory system: Normal respiratory effort, no wheezing Cardiovascular system: regular rate, s1, s2 Gastrointestinal system: Soft, nondistended, positive BS Central nervous system: CN2-12 grossly intact, strength intact Extremities: Perfused, no clubbing Skin: Normal skin turgor, no notable skin lesions seen Psychiatry: Mood normal // no visual hallucinations   Data Reviewed:  There are no new results to review at this time.  Family Communication: Pt in room, family at bedside  Disposition: Status is: Inpatient Remains inpatient appropriate because: severity of illness  Planned Discharge Destination:  Unclear at this time    Author: Rickey Barbara, MD 04/04/2023 3:10 PM  For on call review [email or web address].

## 2023-04-04 NOTE — Plan of Care (Signed)
  Problem: Clinical Measurements: Goal: Will remain free from infection Outcome: Progressing Goal: Diagnostic test results will improve Outcome: Progressing Goal: Respiratory complications will improve Outcome: Progressing Goal: Cardiovascular complication will be avoided Outcome: Progressing   Problem: Activity: Goal: Risk for activity intolerance will decrease Outcome: Progressing   Problem: Nutrition: Goal: Adequate nutrition will be maintained Outcome: Progressing   Problem: Coping: Goal: Level of anxiety will decrease Outcome: Progressing   Problem: Elimination: Goal: Will not experience complications related to bowel motility Outcome: Progressing Goal: Will not experience complications related to urinary retention Outcome: Progressing   Problem: Pain Management: Goal: General experience of comfort will improve Outcome: Progressing   Problem: Safety: Goal: Ability to remain free from injury will improve Outcome: Progressing   Problem: Skin Integrity: Goal: Risk for impaired skin integrity will decrease Outcome: Progressing

## 2023-04-04 NOTE — Plan of Care (Signed)

## 2023-04-05 NOTE — Plan of Care (Signed)

## 2023-04-05 NOTE — Progress Notes (Signed)
  Progress Note   Patient: Natalie Brown [id number] DOB: 1965/08/29 DOA: 01/22/2023     23 DOS: the patient was seen and examined on 04/05/2023   Brief hospital course:  57 y.o. female history significant for early onset dementia, bipolar disorder, catatonia essential hypertension sinus tachycardia B12 deficiency severe protein caloric malnutrition he was in the hospital for more than 400 days discharged on 01/22/2023 only to come back to the ED after 49 days due to agitation.  While in the ED evaluated by psychiatry who made changes to his medication.  Patient is currently a DNR/DNI patient was admitted to try to improve patient environment, circadian rhythm and provide a window which can help improve the patient psychiatric status.  Palliative care is also on board. Difficulty with placement.  Patient has been made comfort care.  Assessment and Plan: 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)/bipolar disorder/possible catatonia Seen by neurology-no further recommendations.  Further med management per psychiatry, currently on lorazepam 0.5 mg 3 times daily and additional as needed as necessary.  Memantine 2.5mg  daily was started recently per psychiatry  Palliative care team following, currently DNR/DNI.  TOC, psychiatry, palliative care following.   -Patient now on to comfort care. Placement remains pending   Hypovolemic hypernatremia: Stable.  Can do periodic lab work   Weight loss: Malnutrition Significant weight loss with poor oral intake. - per 11/20 Palliative Care documentation, no feeding tube - Comfort measures   Sinus tachycardia Improved   Essential hypertension Not on any meds.  IV as needed      Subjective: Unable to assess given mentation  Physical Exam: Vitals:   04/04/23 1918 04/05/23 0531 04/05/23 0553 04/05/23 0839  BP: (!) 120/97 (!) 86/59  93/64  Pulse: 80 74  67  Resp: 18 18  18   Temp: 98.5 F (36.9 C) 97.7 F (36.5 C)  98.4 F (36.9 C)   TempSrc: Oral Oral  Oral  SpO2: 100% 100%  100%  Weight:  50.2 kg 50.2 kg   Height:       General exam: Conversant, in no acute distress Respiratory system: normal chest rise, clear, no audible wheezing Cardiovascular system: regular rhythm, s1-s2 Gastrointestinal system: Nondistended, nontender, pos BS Central nervous system: No seizures, no tremors Extremities: No cyanosis, no joint deformities Skin: No rashes, no pallor Psychiatry: Affect normal // no auditory hallucinations   Data Reviewed:  There are no new results to review at this time.  Family Communication: Pt in room, family at bedside  Disposition: Status is: Inpatient Remains inpatient appropriate because: severity of illness  Planned Discharge Destination:  Unclear at this time    Author: Rickey Barbara, MD 04/05/2023 2:07 PM  For on call review [email or web address].

## 2023-04-05 NOTE — Plan of Care (Signed)
  Problem: Clinical Measurements: Goal: Will remain free from infection Outcome: Progressing Goal: Respiratory complications will improve Outcome: Progressing Goal: Cardiovascular complication will be avoided Outcome: Progressing   Problem: Activity: Goal: Risk for activity intolerance will decrease Outcome: Progressing   Problem: Coping: Goal: Level of anxiety will decrease Outcome: Progressing   Problem: Elimination: Goal: Will not experience complications related to bowel motility Outcome: Progressing Goal: Will not experience complications related to urinary retention Outcome: Progressing   Problem: Pain Management: Goal: General experience of comfort will improve Outcome: Progressing   Problem: Safety: Goal: Ability to remain free from injury will improve Outcome: Progressing   Problem: Skin Integrity: Goal: Risk for impaired skin integrity will decrease Outcome: Progressing

## 2023-04-06 NOTE — Progress Notes (Signed)
  Progress Note   Patient: Natalie Brown [id number] DOB: [birth year] DOA: 01/22/2023     24 DOS: the patient was seen and examined on 04/06/2023   Brief hospital course:  57 y.o. female history significant for early onset dementia, bipolar disorder, catatonia essential hypertension sinus tachycardia B12 deficiency severe protein caloric malnutrition he was in the hospital for more than 400 days discharged on 01/22/2023 only to come back to the ED after 49 days due to agitation.  While in the ED evaluated by psychiatry who made changes to his medication.  Patient is currently a DNR/DNI patient was admitted to try to improve patient environment, circadian rhythm and provide a window which can help improve the patient psychiatric status.  Palliative care is also on board. Difficulty with placement.  Patient has been made comfort care.  Assessment and Plan: 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)/bipolar disorder/possible catatonia Seen by neurology-no further recommendations.  Further med management per psychiatry, currently on lorazepam 0.5 mg 3 times daily and additional as needed as necessary.  Memantine 2.5mg  daily was started recently per psychiatry  Palliative care team following, currently DNR/DNI.  TOC, psychiatry, palliative care following.   -Patient now on to comfort care. Placement difficult, remains pending   Hypovolemic hypernatremia: Stable.  Can do periodic lab work   Weight loss: Malnutrition Significant weight loss with poor oral intake. - per 11/20 Palliative Care documentation, no feeding tube - Comfort measures   Sinus tachycardia Improved   Essential hypertension Not on any meds.  IV as needed      Subjective: Cannot assess given mentation  Physical Exam: Vitals:   04/05/23 0839 04/05/23 1814 04/05/23 1956 04/06/23 0456  BP: 93/64 (!) 85/62 (!) 83/54 (!) 162/141  Pulse: 67 84 68 73  Resp: 18 18 18 18   Temp: 98.4 F (36.9 C) 97.7 F (36.5  C) (!) 97.5 F (36.4 C)   TempSrc: Oral Oral Oral   SpO2: 100% 100% 100% 100%  Weight:      Height:       General exam: Awake, laying in bed, in nad Respiratory system: Normal respiratory effort, no wheezing Cardiovascular system: regular rate, s1, s2 Gastrointestinal system: Soft, nondistended, positive BS Central nervous system: CN2-12 grossly intact, strength intact Extremities: Perfused, no clubbing Skin: Normal skin turgor, no notable skin lesions seen Psychiatry: Mood normal // no visual hallucinations   Data Reviewed:  There are no new results to review at this time.  Family Communication: Pt in room, family at bedside  Disposition: Status is: Inpatient Remains inpatient appropriate because: severity of illness  Planned Discharge Destination:  Unclear at this time    Author: Rickey Barbara, MD 04/06/2023 2:53 PM  For on call review [email or web address].

## 2023-04-06 NOTE — Plan of Care (Signed)

## 2023-04-07 NOTE — TOC Progression Note (Signed)
Transition of Care [facility]) - Progression Note    Patient Details  Name: DEMIAH SLIGH MRN: [id number] Date of Birth: 1965-06-05  Transition of Care [facility]) CM/SW Contact  Janae Bridgeman, RN Phone Number: 04/07/2023, 3:54 PM  Clinical Narrative:    NO bed offers available at this time.  I called and spoke with [facility] Plan and the patient does not have a care manager assigned.  I asked that [facility] plan provide the patient with a care manager and return my call when patient has one assigned.  Patient needs LTC placement with Hospice and patient has no available bed offers at this time.        Expected Discharge Plan and Services                                               Social Determinants of Health (SDOH) Interventions SDOH Screenings   Food Insecurity: No Food Insecurity (01/24/2023)  Housing: Low Risk  (01/24/2023)  Transportation Needs: Patient Unable To Answer (01/24/2023)  Utilities: Not At Risk (01/24/2023)  Alcohol Screen: Low Risk  (09/06/2021)  Depression (PHQ2-9): Medium Risk (05/26/2020)  Tobacco Use: High Risk (04/04/2023)    Readmission Risk Interventions     No data to display

## 2023-04-07 NOTE — Plan of Care (Signed)
  Problem: Nutrition: Goal: Adequate nutrition will be maintained Outcome: Progressing   Problem: Coping: Goal: Level of anxiety will decrease Outcome: Progressing   

## 2023-04-07 NOTE — Progress Notes (Signed)
PROGRESS NOTE    Natalie Brown  AVW:[id number] DOB: 1966-01-20 DOA: 01/22/2023 PCP: Lavinia Sharps, NP   Brief Narrative:  57 y.o. female history significant for early onset dementia, bipolar disorder, catatonia essential hypertension sinus tachycardia B12 deficiency severe protein caloric malnutrition he was in the hospital for more than 400 days discharged on 01/22/2023 only to come back to the ED after 49 days due to agitation.  While in the ED evaluated by psychiatry who made changes to his medication.  Patient is currently a DNR/DNI patient was admitted to try to improve patient environment, circadian rhythm and provide a window which can help improve the patient psychiatric status.  Palliative care is also on board. Difficulty with placement.  Patient has been made comfort care.   Assessment and Plan:  Early onset dementia Noted.  Palliative care consulted patient has been transitioned to comfort measures. -Continue Namenda and ativan  Bipolar disorder No longer on antipsychotic medication management. Comfort measures.  Hypernatremia Secondary to poor oral intake. Now comfort measures.  Severe protein calorie malnutrition Significant weight loss secondary to poor oral intake. No feeding tube per goals of care. Patient is comfort measures.  CKD stage IIIa Stable.  Primary hypertension No longer on medical therapy. Comfort measures.  Sinus tachycardia Noted. Likely related to prior dehydration. Resolved.   DVT prophylaxis: Lovenox Code Status:   Code Status: Limited: Do not attempt resuscitation (DNR) -DNR-LIMITED -Do Not Intubate/DNI  Family Communication: None at bedside. Disposition Plan: Discharge to nursing home likely in 24 hours pending neurology evaluation   Consultants:  Psychiatry  Procedures:    Antimicrobials:    Subjective: Patient is non-verbal.  Objective: BP 101/62 (BP Location: Right Arm)   Pulse 68   Temp (!) 97.5 F (36.4 C)  (Axillary)   Resp 16   Ht 5\' 6"  (1.676 m)   Wt 47.9 kg   SpO2 100%   BMI 17.04 kg/m   Examination:  General exam: Appears calm and comfortable. Lying in bed. No distress.    Data Reviewed: I have personally reviewed following labs and imaging studies  CBC Lab Results  Component Value Date   WBC 5.5 03/26/2023   RBC 4.59 03/26/2023   HGB 13.5 03/26/2023   HCT 40.6 03/26/2023   MCV 88.5 03/26/2023   MCH 29.4 03/26/2023   PLT 288 03/26/2023   MCHC 33.3 03/26/2023   RDW 13.2 03/26/2023   LYMPHSABS 1.8 03/19/2023   MONOABS 0.3 03/19/2023   EOSABS 0.1 03/19/2023   BASOSABS 0.0 03/19/2023     Last metabolic panel Lab Results  Component Value Date   NA 144 03/26/2023   K 4.6 03/26/2023   CL 113 (H) 03/26/2023   CO2 24 03/26/2023   BUN 19 03/26/2023   CREATININE 1.26 (H) 03/26/2023   GLUCOSE 89 03/26/2023   GFRNONAA 50 (L) 03/26/2023   GFRAA 65 09/19/2017   CALCIUM 8.7 (L) 03/26/2023   PHOS 3.0 03/26/2023   PROT 6.8 03/14/2023   ALBUMIN 3.2 (L) 03/14/2023   LABGLOB 2.8 06/21/2020   AGRATIO 1.5 06/21/2020   BILITOT 0.4 03/14/2023   ALKPHOS 56 03/14/2023   AST 43 (H) 03/14/2023   ALT 57 (H) 03/14/2023   ANIONGAP 7 03/26/2023    GFR: Estimated Creatinine Clearance: 38.1 mL/min (A) (by C-G formula based on SCr of 1.26 mg/dL (H)).  No results found for this or any previous visit (from the past 240 hours).    Radiology Studies: No results found.  LOS: 25 days    Jacquelin Hawking, MD [facility] 04/07/2023, 7:03 AM   If 7PM-7AM, please contact night-coverage [email or web address]

## 2023-04-08 NOTE — Progress Notes (Signed)
Nutrition Follow-up  DOCUMENTATION CODES:   Severe malnutrition in context of chronic illness, Underweight - ongoing  INTERVENTION:  -Continue regular diet and promote PO intake as patient tolerates  - Continue Ensure Plus High Protein po BID - Discontinue Prosource Plus BID - Discontinue MVI with minerals daily -Continue Magic cup TID with meals, as patient tolerates (nursing using to administer meds) -Please re-consult dietitian as needed   NUTRITION DIAGNOSIS:   Severe Malnutrition related to chronic illness as evidenced by severe fat depletion, severe muscle depletion, energy intake < or equal to 50% for > or equal to 5 days. -ongoing   GOAL:    (Support PO intake as patient tolerates)   REASON FOR ASSESSMENT:   Consult Assessment of nutrition requirement/status  ASSESSMENT:   57 y.o. F, that has been with [facility] since 12-24-21. Was been in ED or 49 days and admitted to [facility] 03/13/2023 for early onset dementia with behavioral disturbance. PMH; CKD III, HTN, bipolar 1 disorder with mania, catatonia, dementia.   Of note, patient with previous admission of over 400 days for similar presentation. Discharged to facility for a very brief stent approximately two months ago before her return.  Marked decline in intake since admission, averaging 33% of meals x4 days. Palliative maintains she is comfort measures and artificial nutrition is not indicated. Per hospitalist note on 12/10, pt has been made comfort care. TOC engaged in placing patient in LTC with Hospice. No available beds at this time.  Spoke with Nurse Tech, who's sitting at bedside. Patient is taking bites of meals and seems to prefer soft foods. Is accepting Magic Cup and Ensure well.   Labs: No new labs to assess since 12/04 and unremarkable at that time  Meds: acetaminophen, Lorazepam, memantine  12/10 - Patient made comfort 11/21 - Admission requested  11/19 - Palliative consulted 09/04 - Admitted to  ED for increased agitation/altered mentation  Diet Order:   Diet Order             Diet regular Room service appropriate? Yes; Fluid consistency: Thin  Diet effective now           EDUCATION NEEDS:   Not appropriate for education at this time.   Skin:  Skin Assessment: Reviewed RN Assessment  Last BM:  12/17 - Type 1  Height:   Ht Readings from Last 1 Encounters:  01/22/23 5\' 6"  (1.676 m)    Current Weight: 48.5kg Admission Weight: 47kg  Daughter notes, per previous  Ideal Body Weight:  59.1 kg  BMI:  Body mass index is 17.26 kg/m.   Myrtie Cruise MS, RD, LDN Registered Dietitian Clinical Nutrition RD Inpatient Contact Info in Amion

## 2023-04-08 NOTE — Progress Notes (Signed)
PROGRESS NOTE    Natalie Brown  ZOX:[id number] DOB: 09-Mar-1966 DOA: 01/22/2023 PCP: Lavinia Sharps, NP   Brief Narrative:  57 y.o. female history significant for early onset dementia, bipolar disorder, catatonia essential hypertension sinus tachycardia B12 deficiency severe protein caloric malnutrition he was in the hospital for more than 400 days discharged on 01/22/2023 only to come back to the ED after 49 days due to agitation.  While in the ED evaluated by psychiatry who made changes to his medication.  Patient is currently a DNR/DNI patient was admitted to try to improve patient environment, circadian rhythm and provide a window which can help improve the patient psychiatric status.  Palliative care is also on board. Difficulty with placement.  Patient has been made comfort care.   Assessment and Plan:  Early onset dementia Noted.  Palliative care consulted patient has been transitioned to comfort measures. -Continue Namenda and ativan  Bipolar disorder No longer on antipsychotic medication management. Comfort measures.  Hypernatremia Secondary to poor oral intake. Now comfort measures.  Severe protein calorie malnutrition Significant weight loss secondary to poor oral intake. No feeding tube per goals of care. Patient is comfort measures.  CKD stage IIIa Stable.  Primary hypertension No longer on medical therapy. Comfort measures.  Sinus tachycardia Noted. Likely related to prior dehydration. Resolved.   DVT prophylaxis: Lovenox Code Status:   Code Status: Limited: Do not attempt resuscitation (DNR) -DNR-LIMITED -Do Not Intubate/DNI  Family Communication: None at bedside. Disposition Plan: Discharge pending ongoing TOC   Consultants:  Psychiatry  Procedures:    Antimicrobials:    Subjective: Patient is non-verbal.  Objective: BP (!) 85/60 (BP Location: Right Arm)   Pulse 78   Temp (!) 97.5 F (36.4 C) (Oral)   Resp 18   Ht 5\' 6"  (1.676 m)    Wt 48.5 kg   SpO2 98%   BMI 17.26 kg/m   Examination:  General exam: Appears calm and comfortable. Gets agitated with examination Respiratory exam: Diminished breath sounds    Data Reviewed: I have personally reviewed following labs and imaging studies  CBC Lab Results  Component Value Date   WBC 5.5 03/26/2023   RBC 4.59 03/26/2023   HGB 13.5 03/26/2023   HCT 40.6 03/26/2023   MCV 88.5 03/26/2023   MCH 29.4 03/26/2023   PLT 288 03/26/2023   MCHC 33.3 03/26/2023   RDW 13.2 03/26/2023   LYMPHSABS 1.8 03/19/2023   MONOABS 0.3 03/19/2023   EOSABS 0.1 03/19/2023   BASOSABS 0.0 03/19/2023     Last metabolic panel Lab Results  Component Value Date   NA 144 03/26/2023   K 4.6 03/26/2023   CL 113 (H) 03/26/2023   CO2 24 03/26/2023   BUN 19 03/26/2023   CREATININE 1.26 (H) 03/26/2023   GLUCOSE 89 03/26/2023   GFRNONAA 50 (L) 03/26/2023   GFRAA 65 09/19/2017   CALCIUM 8.7 (L) 03/26/2023   PHOS 3.0 03/26/2023   PROT 6.8 03/14/2023   ALBUMIN 3.2 (L) 03/14/2023   LABGLOB 2.8 06/21/2020   AGRATIO 1.5 06/21/2020   BILITOT 0.4 03/14/2023   ALKPHOS 56 03/14/2023   AST 43 (H) 03/14/2023   ALT 57 (H) 03/14/2023   ANIONGAP 7 03/26/2023    GFR: Estimated Creatinine Clearance: 38.6 mL/min (A) (by C-G formula based on SCr of 1.26 mg/dL (H)).  No results found for this or any previous visit (from the past 240 hours).    Radiology Studies: No results found.    LOS:  26 days    Jacquelin Hawking, MD [facility] 04/08/2023, 7:49 AM   If 7PM-7AM, please contact night-coverage [email or web address]

## 2023-04-08 NOTE — Progress Notes (Addendum)
Patient ate some breakfast this am provided by safety sitter. Ate about 4-5 bites of grits mixed with some scrambled eggs as well as 1 full ice cream. Pt was able to take all medications crushed with breakfast.

## 2023-04-08 NOTE — Plan of Care (Signed)

## 2023-04-09 MED ORDER — LORAZEPAM 2 MG/ML IJ SOLN
1.0000 mg | Freq: Four times a day (QID) | INTRAMUSCULAR | Status: DC | PRN
  Administered 2023-04-11 – 2023-11-12 (×23): 1 mg via INTRAMUSCULAR
  Filled 2023-04-09 (×24): qty 1

## 2023-04-09 NOTE — Plan of Care (Signed)
Pt remains nonverbal. RA. Meds given per MAR crushed in strawberry ice cream. Meals as documented in flowsheet. Pt seen resting in bed with sitter present at bedside.  Problem: Education: Goal: Knowledge of General Education information will improve Description: Including pain rating scale, medication(s)/side effects and non-pharmacologic comfort measures Outcome: Not Progressing   Problem: Health Behavior/Discharge Planning: Goal: Ability to manage health-related needs will improve Outcome: Not Progressing   Problem: Nutrition: Goal: Adequate nutrition will be maintained Outcome: Not Progressing   Problem: Pain Management: Goal: General experience of comfort will improve Outcome: Not Progressing

## 2023-04-09 NOTE — Consult Note (Signed)
Brief Psychiatry Consult Note  This is partially a late entry for work completed 12/17 and there is no charge. I saw Natalie Brown on 12/17 - she was virtually unchanged from prior assessment. Has been eating a little more per nursing staff. She jumped back away from me when I tried to examine her, so I did not complete a physical exam (causing pt distress). I called her mother Natalie Brown on 12/18 and there was no response. I am going to take Natalie Brown off of the active psych consult list, but we are happy to see her again if and when consulted (either to see pt or support mother/join in meetings).  Final dx from psychiatry is dementia w/ catatonic features; see last note for full assessment/hx/rationale of BZD monotherapy in pt with dementia.   - continue lorazepam 0.5 mg TID (as before - OK to hold for sedation, would prefer she get 2-3 doses) - this is ordered as a liquid - continue lorazeapm 1 mg q6 PRN anxiety/agitation - this is ordered as an injection -- continue memantin 2.5 mg every day (dementia, catatonia)   We will sign off at this time. This has been communicated to the primary team. If issues arise in the future, don't hesitate to reconsult the Psychiatry Inpatient Consult Service.   Natalie Brown

## 2023-04-09 NOTE — Plan of Care (Signed)

## 2023-04-09 NOTE — Progress Notes (Signed)
PROGRESS NOTE    Natalie Brown  HYQ:[id number] DOB: 04/24/1965 DOA: 01/22/2023 PCP: Lavinia Sharps, NP   Brief Narrative:  57 y.o. female history significant for early onset dementia, bipolar disorder, catatonia essential hypertension sinus tachycardia B12 deficiency severe protein caloric malnutrition he was in the hospital for more than 400 days discharged on 01/22/2023 only to come back to the ED after 49 days due to agitation.  While in the ED evaluated by psychiatry who made changes to his medication.  Patient is currently a DNR/DNI patient was admitted to try to improve patient environment, circadian rhythm and provide a window which can help improve the patient psychiatric status.  Palliative care is also on board. Difficulty with placement.  Patient has been made comfort care.   Assessment and Plan:  Early onset dementia Noted.  Palliative care consulted patient has been transitioned to comfort measures. -Continue Namenda and ativan  Bipolar disorder No longer on antipsychotic medication management. Comfort measures.  Hypernatremia Secondary to poor oral intake. Now comfort measures.  Severe protein calorie malnutrition Significant weight loss secondary to poor oral intake. No feeding tube per goals of care. Patient is comfort measures.  CKD stage IIIa Stable.  Primary hypertension No longer on medical therapy. Comfort measures.  Sinus tachycardia Noted. Likely related to prior dehydration. Resolved.   DVT prophylaxis: Lovenox Code Status:   Code Status: Limited: Do not attempt resuscitation (DNR) -DNR-LIMITED -Do Not Intubate/DNI  Family Communication: None at bedside. Disposition Plan: Discharge pending ongoing TOC   Consultants:  Psychiatry  Procedures:    Antimicrobials:    Subjective: Patient is non-verbal.  Objective: BP (!) 106/57 (BP Location: Right Arm)   Pulse 63   Temp 97.6 F (36.4 C) (Oral)   Resp 18   Ht 5\' 6"  (1.676 m)   Wt  45.8 kg   SpO2 96%   BMI 16.30 kg/m   Examination:  General exam: Appears calm and comfortable. Lying, curled up, in bed.  Data Reviewed: I have personally reviewed following labs and imaging studies  CBC Lab Results  Component Value Date   WBC 5.5 03/26/2023   RBC 4.59 03/26/2023   HGB 13.5 03/26/2023   HCT 40.6 03/26/2023   MCV 88.5 03/26/2023   MCH 29.4 03/26/2023   PLT 288 03/26/2023   MCHC 33.3 03/26/2023   RDW 13.2 03/26/2023   LYMPHSABS 1.8 03/19/2023   MONOABS 0.3 03/19/2023   EOSABS 0.1 03/19/2023   BASOSABS 0.0 03/19/2023     Last metabolic panel Lab Results  Component Value Date   NA 144 03/26/2023   K 4.6 03/26/2023   CL 113 (H) 03/26/2023   CO2 24 03/26/2023   BUN 19 03/26/2023   CREATININE 1.26 (H) 03/26/2023   GLUCOSE 89 03/26/2023   GFRNONAA 50 (L) 03/26/2023   GFRAA 65 09/19/2017   CALCIUM 8.7 (L) 03/26/2023   PHOS 3.0 03/26/2023   PROT 6.8 03/14/2023   ALBUMIN 3.2 (L) 03/14/2023   LABGLOB 2.8 06/21/2020   AGRATIO 1.5 06/21/2020   BILITOT 0.4 03/14/2023   ALKPHOS 56 03/14/2023   AST 43 (H) 03/14/2023   ALT 57 (H) 03/14/2023   ANIONGAP 7 03/26/2023    GFR: Estimated Creatinine Clearance: 36.5 mL/min (A) (by C-G formula based on SCr of 1.26 mg/dL (H)).  No results found for this or any previous visit (from the past 240 hours).    Radiology Studies: No results found.    LOS: 27 days    Jacquelin Hawking,  MD [facility] 04/09/2023, 12:11 PM   If 7PM-7AM, please contact night-coverage [email or web address]

## 2023-04-10 NOTE — Progress Notes (Signed)
PROGRESS NOTE Natalie Brown  WUJ:[id number] DOB: [birth year] DOA: 01/22/2023 PCP: Lavinia Sharps, NP  Brief Narrative/Hospital Course:  57 y.o. female history significant for early onset dementia, bipolar disorder, catatonia essential hypertension sinus tachycardia B12 deficiency severe protein caloric malnutrition he was in the hospital for more than 400 days discharged on 01/22/2023 only to come back to the ED shortly after due to agitation and has been in the ED boarded for placement After 49 days admitted.While in the ED evaluated by psychiatry who made changes to her medication.  Patient currently a DNR/DNI- admitted to try to improve patient environment, circadian rhythm and provide a window which can help improve the patient psychiatric status.Palliative care has been closely involved as well as psychiatry and TOC team. Neurology team saw her on 11/25 felt that her presentation is likely related to progression of her underlying neurodegenerative process, has manifested itself as early onset dementia and probably of other psychiatric disorders as well.  Advised behavioral management supportive and ongoing palliative care conversation She had been difficulty with placement.Patient has been made comfort care subsequently.      Subjective: Patient seen and examined, bedside sitter in the room-states earlier she woke up ate some and currently sleeping.  Mumbles words opens eyes briefly   Assessment and Plan: Principal Problem:   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) Active Problems:   Sinus tachycardia   Bipolar 1 disorder (HCC)   Essential hypertension   Agitation   Failure to thrive in adult   Hypoalbuminemia   Palliative care by specialist   Delirium   Comfort measures: Patient with progressively worsening neurodegenerative disease early onset dementia with catatonic features, poor oral intake severe malnutrition, failure to thrive.  Currently on comfort measures,  continue supportive care  Early onset dementia Neurodegenerative disease: Was evaluated by neurology for prognostication.  Continue supportive care palliative care and comfort measures.  Bipolar disorder Catatonic features: Psychiatry following closely continue Ativan 0.5 mg 3 times daily, Namenda, trazodone 50 bedtime as needed, Ativan 1 mg IM every 6 hours for anxiety.  Psychiatry signed off 12/18  Hyponatremia CKD stage III AA: Encourage oral intake.  With poor oral intake now on comfort measures  Sinus tachycardia Hypertension: BP stable.  No longer meds  Severe malnutrition Significant weight loss due to poor oral intake from her dementia: Now on comfort measures  DVT prophylaxis: enoxaparin (LOVENOX) injection 30 mg Start: 03/13/23 2200 Code Status:   Code Status: Limited: Do not attempt resuscitation (DNR) -DNR-LIMITED -Do Not Intubate/DNI  Family Communication: plan of care discussed with patient/SITTER at bedside. Patient status is: Remains hospitalized because of severity of illness Level of care: Med-Surg   Dispo: The patient is from: HOME            Anticipated disposition: On comfort measures.  Difficult disposition Objective: Vitals last 24 hrs: Vitals:   04/08/23 2117 04/09/23 0500 04/09/23 0514 04/10/23 0500  BP: 110/85  (!) 106/57   Pulse: (!) 57  63   Resp: 18  18   Temp: (!) 97.3 F (36.3 C)  97.6 F (36.4 C)   TempSrc: Oral  Oral   SpO2: 96%     Weight:  45.8 kg  46.2 kg  Height:       Weight change: 0.4 kg  Physical Examination: General exam: Lethargic  HEENT:Oral mucosa moist, Ear/Nose WNL grossly Respiratory system: Bilaterally clear BS,no use of accessory muscle Cardiovascular system: S1 & S2 +, No JVD. Gastrointestinal system: Abdomen  soft,NT,ND, BS+ Nervous System: Lethargic , laying on the bed in fetal position Extremities: LE edema neg,distal peripheral pulses palpable and warm.  Skin: No rashes,no icterus. MSK: thin muscle  bulk,tone, power   Medications reviewed:  Scheduled Meds:  acetaminophen  1,000 mg Oral TID   enoxaparin (LOVENOX) injection  30 mg Subcutaneous Q24H   feeding supplement  237 mL Oral BID BM   LORazepam  0.5 mg Oral TID   memantine  2.5 mg Oral Daily   sodium chloride flush  3 mL Intravenous Q12H  Continuous Infusions:   Diet Order             Diet regular Room service appropriate? Yes; Fluid consistency: Thin  Diet effective now                  Net IO Since Admission: 24,331.11 mL [04/10/23 1327]  Wt Readings from Last 3 Encounters:  04/10/23 46.2 kg  01/21/23 46 kg  12/24/21 72 kg   Unresulted Labs (From admission, onward)    None      Data Reviewed: I have personally reviewed following labs and imaging studies CBC:No results for input(s): "WBC", "NEUTROABS", "HGB", "HCT", "MCV", "PLT" in the last 168 hours. Basic Metabolic Panel: No results for input(s): "NA", "K", "CL", "CO2", "GLUCOSE", "BUN", "CREATININE", "CALCIUM", "MG", "PHOS" in the last 168 hours. GFR: Estimated Creatinine Clearance: 36.8 mL/min (A) (by C-G formula based on SCr of 1.26 mg/dL (H)). Antimicrobials/Microbiology: Anti-infectives (From admission, onward)    None         Component Value Date/Time   SDES BLOOD LEFT HAND 01/12/2022 0828   SDES BLOOD RIGHT HAND 01/12/2022 0828   SPECREQUEST  01/12/2022 0828    BOTTLES DRAWN AEROBIC ONLY Blood Culture results may not be optimal due to an inadequate volume of blood received in culture bottles   SPECREQUEST  01/12/2022 0828    BOTTLES DRAWN AEROBIC ONLY Blood Culture results may not be optimal due to an inadequate volume of blood received in culture bottles   CULT  01/12/2022 0828    NO GROWTH 5 DAYS Performed at [facility], [address], [place], [place] [address]    CULT  01/12/2022 [phone]    NO GROWTH 5 DAYS Performed at [facility], [address], [place], [place] [address]    REPTSTATUS 01/17/2022 FINAL 01/12/2022 7564    REPTSTATUS 01/17/2022 FINAL 01/12/2022 3329    Radiology Studies: No results found.   LOS: 28 days   Total time spent in review of labs and imaging, patient evaluation, formulation of plan, documentation and communication with family: 35 minutes  Lanae Boast, MD [facility]  04/10/2023, 1:27 PM

## 2023-04-10 NOTE — Plan of Care (Signed)
  Problem: Elimination: Goal: Will not experience complications related to bowel motility Outcome: Progressing Goal: Will not experience complications related to urinary retention Outcome: Progressing   Problem: Pain Management: Goal: General experience of comfort will improve Outcome: Progressing   Problem: Safety: Goal: Ability to remain free from injury will improve Outcome: Progressing   Problem: Skin Integrity: Goal: Risk for impaired skin integrity will decrease Outcome: Progressing

## 2023-04-10 NOTE — Plan of Care (Signed)
  Problem: Education: Goal: Knowledge of General Education information will improve Description: Including pain rating scale, medication(s)/side effects and non-pharmacologic comfort measures Outcome: Progressing   Problem: Health Behavior/Discharge Planning: Goal: Ability to manage health-related needs will improve Outcome: Progressing   Problem: Clinical Measurements: Goal: Ability to maintain clinical measurements within normal limits will improve Outcome: Progressing Goal: Will remain free from infection Outcome: Progressing Goal: Diagnostic test results will improve Outcome: Progressing   Problem: Clinical Measurements: Goal: Will remain free from infection Outcome: Progressing

## 2023-04-11 NOTE — Progress Notes (Signed)
PROGRESS NOTE Natalie Brown  [name]:[id number] DOB: May 07, 1965 DOA: 01/22/2023 PCP: Lavinia Sharps, NP  Brief Narrative/Hospital Course:  57 y.o. female history significant for early onset dementia, bipolar disorder, catatonia essential hypertension sinus tachycardia B12 deficiency severe protein caloric malnutrition he was in the hospital for more than 400 days discharged on 01/22/2023 only to come back to the ED shortly after due to agitation and has been in the ED boarded for placement After 49 days admitted.While in the ED evaluated by psychiatry who made changes to her medication.  Patient currently a DNR/DNI- admitted to try to improve patient environment, circadian rhythm and provide a window which can help improve the patient psychiatric status.Palliative care has been closely involved as well as psychiatry and TOC team. Neurology team saw her on 11/25 felt that her presentation is likely related to progression of her underlying neurodegenerative process, has manifested itself as early onset dementia and probably of other psychiatric disorders as well.  Advised behavioral management supportive and ongoing palliative care conversation She had been difficulty with placement.Patient has been made comfort care subsequently.    Subjective: Seen and examined Sitter at bedside Opens eyes non verbal  Nursing held the studies schedule Ativan due to patient being in sleep   Assessment and Plan: Principal Problem:   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) Active Problems:   Sinus tachycardia   Bipolar 1 disorder (HCC)   Essential hypertension   Agitation   Failure to thrive in adult   Hypoalbuminemia   Palliative care by specialist   Delirium   Comfort measures: Patient with progressively worsening neurodegenerative disease early onset dementia with catatonic features, poor oral intake severe malnutrition, failure to thrive.  Currently on comfort measures, continue supportive  care  Early onset dementia Neurodegenerative disease: Was evaluated by neurology for prognostication.  Continue supportive care palliative care and comfort measures.  Bipolar disorder Catatonic features: Psychiatry following closely continue Ativan 0.5 mg 3 times daily, Namenda, trazodone 50 bedtime as needed, Ativan 1 mg IM every 6 hours for anxiety.  Psychiatry signed off 12/18  Hyponatremia CKD stage III AA: Encourage oral intake.  With poor oral intake now on comfort measures  Sinus tachycardia Hypertension: BP stable.  No longer meds  Severe malnutrition Significant weight loss due to poor oral intake from her dementia: Now on comfort measures  DVT prophylaxis: enoxaparin (LOVENOX) injection 30 mg Start: 03/13/23 2200 Code Status:   Code Status: Limited: Do not attempt resuscitation (DNR) -DNR-LIMITED -Do Not Intubate/DNI  Family Communication: plan of care discussed with patient/SITTER at bedside. Patient status is: Remains hospitalized because of severity of illness Level of care: Med-Surg   Dispo: The patient is from: HOME            Anticipated disposition: On comfort measures.  Difficult disposition Objective: Vitals last 24 hrs: Vitals:   04/10/23 0500 04/10/23 1902 04/11/23 0331 04/11/23 0339  BP:  97/71 (!) 93/59   Pulse:  (!) 59 71   Resp:  16 20   Temp:  97.8 F (36.6 C) (!) 97.4 F (36.3 C)   TempSrc:  Axillary    SpO2:  100% (!) 85%   Weight: 46.2 kg   45.2 kg  Height:       Weight change: -1 kg  Physical Examination: General exam: eyes open non verbal in fetal position HEENT:Oral mucosa moist, Ear/Nose WNL grossly Respiratory system: Bilaterally clear BS,no use of accessory muscle Cardiovascular system: S1 & S2 +, No JVD.  Gastrointestinal system: Abdomen soft,NT,ND, BS+ Nervous System: non verbal in fetal position Extremities: LE edema neg,distal peripheral pulses palpable and warm.  Skin: No rashes,no icterus. MSK: thin muscle bulk,tone,  power    Medications reviewed:  Scheduled Meds:  acetaminophen  1,000 mg Oral TID   enoxaparin (LOVENOX) injection  30 mg Subcutaneous Q24H   feeding supplement  237 mL Oral BID BM   LORazepam  0.5 mg Oral TID   memantine  2.5 mg Oral Daily   sodium chloride flush  3 mL Intravenous Q12H  Continuous Infusions:   Diet Order             Diet regular Room service appropriate? Yes; Fluid consistency: Thin  Diet effective now                  Net IO Since Admission: 24,451.11 mL [04/11/23 0827]  Wt Readings from Last 3 Encounters:  04/11/23 45.2 kg  01/21/23 46 kg  12/24/21 72 kg   Unresulted Labs (From admission, onward)    None      Data Reviewed: I have personally reviewed following labs and imaging studies CBC:No results for input(s): "WBC", "NEUTROABS", "HGB", "HCT", "MCV", "PLT" in the last 168 hours. Basic Metabolic Panel: No results for input(s): "NA", "K", "CL", "CO2", "GLUCOSE", "BUN", "CREATININE", "CALCIUM", "MG", "PHOS" in the last 168 hours. GFR: Estimated Creatinine Clearance: 36 mL/min (A) (by C-G formula based on SCr of 1.26 mg/dL (H)). Antimicrobials/Microbiology: Anti-infectives (From admission, onward)    None         Component Value Date/Time   SDES BLOOD LEFT HAND 01/12/2022 0828   SDES BLOOD RIGHT HAND 01/12/2022 0828   SPECREQUEST  01/12/2022 0828    BOTTLES DRAWN AEROBIC ONLY Blood Culture results may not be optimal due to an inadequate volume of blood received in culture bottles   SPECREQUEST  01/12/2022 0828    BOTTLES DRAWN AEROBIC ONLY Blood Culture results may not be optimal due to an inadequate volume of blood received in culture bottles   CULT  01/12/2022 0828    NO GROWTH 5 DAYS Performed at [facility], [address] [place], [place] [address]    CULT  01/12/2022 [phone]    NO GROWTH 5 DAYS Performed at [facility], [address], [place], [place] [address]    REPTSTATUS 01/17/2022 FINAL 01/12/2022 1914    REPTSTATUS 01/17/2022 FINAL 01/12/2022 7829    Radiology Studies: No results found.   LOS: 29 days   Total time spent in review of labs and imaging, patient evaluation, formulation of plan, documentation and communication with family: 35 minutes  Lanae Boast, MD [facility]  04/11/2023, 8:27 AM

## 2023-04-11 NOTE — Plan of Care (Signed)
  Problem: Activity: Goal: Risk for activity intolerance will decrease Outcome: Progressing   Problem: Nutrition: Goal: Adequate nutrition will be maintained Outcome: Progressing   Problem: Elimination: Goal: Will not experience complications related to bowel motility Outcome: Progressing Goal: Will not experience complications related to urinary retention Outcome: Progressing   

## 2023-04-12 NOTE — Progress Notes (Signed)
PROGRESS NOTE Natalie Brown  [name]:[id number] DOB: Dec 09, 1965 DOA: 01/22/2023 PCP: Lavinia Sharps, NP  Brief Narrative/Hospital Course:  57 y.o. female history significant for early onset dementia, bipolar disorder, catatonia essential hypertension sinus tachycardia B12 deficiency severe protein caloric malnutrition he was in the hospital for more than 400 days discharged on 01/22/2023 only to come back to the ED shortly after due to agitation and has been in the ED boarded for placement After 49 days admitted.While in the ED evaluated by psychiatry who made changes to her medication.  Patient currently a DNR/DNI- admitted to try to improve patient environment, circadian rhythm and provide a window which can help improve the patient psychiatric status.Palliative care has been closely involved as well as psychiatry and TOC team. Neurology team saw her on 11/25 felt that her presentation is likely related to progression of her underlying neurodegenerative process, has manifested itself as early onset dementia and probably of other psychiatric disorders as well.  Advised behavioral management supportive and ongoing palliative care conversation She had been difficulty with placement.Patient has been made comfort care subsequently.    Subjective: Patient seen and examined Laying on the bed, tossing around Nonverbal no conversation  Assessment and Plan: Principal Problem:   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) Active Problems:   Sinus tachycardia   Bipolar 1 disorder (HCC)   Essential hypertension   Agitation   Failure to thrive in adult   Hypoalbuminemia   Palliative care by specialist   Delirium   Comfort measures: Patient with progressively worsening neurodegenerative disease early onset dementia with catatonic features, poor oral intake severe malnutrition, failure to thrive.  Currently on comfort measures, continue supportive care  Early onset  dementia Neurodegenerative disease: Was evaluated by neurology for prognostication.  Continue supportive care palliative care and comfort measures.  Bipolar disorder Catatonic features: Psychiatry following closely continue Ativan 0.5 mg 3 times daily, Namenda, trazodone 50 bedtime as needed, Ativan 1 mg IM every 6 hours for anxiety.  Psychiatry signed off 12/18  Hyponatremia CKD stage III AA: Encourage oral intake.  With poor oral intake now on comfort measures  Sinus tachycardia Hypertension: BP stable.  No longer meds  Severe malnutrition Significant weight loss due to poor oral intake from her dementia: Now on comfort measures  DVT prophylaxis: enoxaparin (LOVENOX) injection 30 mg Start: 03/13/23 2200 Code Status:   Code Status: Limited: Do not attempt resuscitation (DNR) -DNR-LIMITED -Do Not Intubate/DNI  Family Communication: plan of care discussed with patient/SITTER at bedside. Patient status is: Remains hospitalized because of severity of illness Level of care: Med-Surg   Dispo: The patient is from: HOME            Anticipated disposition: On comfort measures.  Difficult disposition Objective: Vitals last 24 hrs: Vitals:   04/11/23 0339 04/11/23 1824 04/11/23 2109 04/12/23 0500  BP:  95/60 103/64   Pulse:  81 (!) 103   Resp:  18 20   Temp:  98.8 F (37.1 C) 97.7 F (36.5 C)   TempSrc:  Oral Axillary   SpO2:  100% 91%   Weight: 45.2 kg   43.6 kg  Height:       Weight change: -1.6 kg  Physical Examination: General exam: Thin frail nonverbal  HEENT:Oral mucosa moist, Ear/Nose WNL grossly Respiratory system: Bilaterally clear BS,no use of accessory muscle Cardiovascular system: S1 & S2 +, No JVD. Gastrointestinal system: Abdomen soft,NT,ND, BS+ Nervous System: Nonverbal, in fetal position tossing around the bed  Extremities:  LE edema neg,distal peripheral pulses palpable and warm.  Skin: No rashes,no icterus. MSK: thin muscle bulk,tone, power     Medications reviewed:  Scheduled Meds:  acetaminophen  1,000 mg Oral TID   enoxaparin (LOVENOX) injection  30 mg Subcutaneous Q24H   feeding supplement  237 mL Oral BID BM   LORazepam  0.5 mg Oral TID   memantine  2.5 mg Oral Daily   sodium chloride flush  3 mL Intravenous Q12H  Continuous Infusions:   Diet Order             Diet regular Room service appropriate? Yes; Fluid consistency: Thin  Diet effective now                  Net IO Since Admission: 24,691.11 mL [04/12/23 0811]  Wt Readings from Last 3 Encounters:  04/12/23 43.6 kg  01/21/23 46 kg  12/24/21 72 kg   Unresulted Labs (From admission, onward)    None      Data Reviewed: I have personally reviewed following labs and imaging studies CBC:No results for input(s): "WBC", "NEUTROABS", "HGB", "HCT", "MCV", "PLT" in the last 168 hours. Basic Metabolic Panel: No results for input(s): "NA", "K", "CL", "CO2", "GLUCOSE", "BUN", "CREATININE", "CALCIUM", "MG", "PHOS" in the last 168 hours. GFR: Estimated Creatinine Clearance: 34.7 mL/min (A) (by C-G formula based on SCr of 1.26 mg/dL (H)). Antimicrobials/Microbiology: Anti-infectives (From admission, onward)    None         Component Value Date/Time   SDES BLOOD LEFT HAND 01/12/2022 0828   SDES BLOOD RIGHT HAND 01/12/2022 0828   SPECREQUEST  01/12/2022 0828    BOTTLES DRAWN AEROBIC ONLY Blood Culture results may not be optimal due to an inadequate volume of blood received in culture bottles   SPECREQUEST  01/12/2022 0828    BOTTLES DRAWN AEROBIC ONLY Blood Culture results may not be optimal due to an inadequate volume of blood received in culture bottles   CULT  01/12/2022 0828    NO GROWTH 5 DAYS Performed at [facility], [address], [place], [place] [address]    CULT  01/12/2022 [phone]    NO GROWTH 5 DAYS Performed at [facility], [address] [place], [place] [address]    REPTSTATUS 01/17/2022 FINAL 01/12/2022 1914   REPTSTATUS  01/17/2022 FINAL 01/12/2022 7829    Radiology Studies: No results found.   LOS: 30 days   Total time spent in review of labs and imaging, patient evaluation, formulation of plan, documentation and communication with family: 35 minutes  Lanae Boast, MD [facility]  04/12/2023, 8:11 AM

## 2023-04-13 NOTE — Progress Notes (Signed)
PROGRESS NOTE Natalie Brown  [name]:[id number] DOB: Sep 11, 1965 DOA: 01/22/2023 PCP: Lavinia Sharps, NP  Brief Narrative/Hospital Course:  57 y.o. female history significant for early onset dementia, bipolar disorder, catatonia essential hypertension sinus tachycardia B12 deficiency severe protein caloric malnutrition he was in the hospital for more than 400 days discharged on 01/22/2023 only to come back to the ED shortly after due to agitation and has been in the ED boarded for placement After 49 days admitted.While in the ED evaluated by psychiatry who made changes to her medication.  Patient currently a DNR/DNI- admitted to try to improve patient environment, circadian rhythm and provide a window which can help improve the patient psychiatric status.Palliative care has been closely involved as well as psychiatry and TOC team. Neurology team saw her on 11/25 felt that her presentation is likely related to progression of her underlying neurodegenerative process, has manifested itself as early onset dementia and probably of other psychiatric disorders as well.  Advised behavioral management supportive and ongoing palliative care conversation She had been difficulty with placement.Patient has been made comfort care subsequently.    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  Currently on comfort measures, continue supportive care  Early onset  dementia Neurodegenerative disease: Was evaluated by neurology for prognostication.  Continue supportive care palliative care and comfort measures.  Bipolar disorder Catatonic features: Psychiatry following closely continue Ativan 0.5 mg 3 times daily, Namenda, trazodone 50 bedtime as needed, Ativan 1 mg IM every 6 hours for anxiety.  Psychiatry signed off 12/18  Hyponatremia CKD stage III AA: Encourage oral intake.  With poor oral intake now on comfort measures  Sinus tachycardia Hypertension: BP stable.  No longer meds  Severe malnutrition Significant weight loss due to poor oral intake from her dementia: Now on comfort measures  DVT prophylaxis: enoxaparin (LOVENOX) injection 30 mg Start: 03/13/23 2200 Code Status:   Code Status: Limited: Do not attempt resuscitation (DNR) -DNR-LIMITED -Do Not Intubate/DNI  Family Communication: plan of care discussed with staffs Patient status is: Remains hospitalized because of severity of illness Level of care: Med-Surg   Dispo: The patient is from: HOME            Anticipated disposition: On comfort measures.  Difficult disposition Objective: Vitals last 24 hrs: Vitals:   04/12/23 0500 04/12/23 1514 04/12/23 1953 04/13/23 0614  BP:  (!) 101/57 106/65 (!) 93/59  Pulse:  91 79 79  Resp:      Temp:  97.9 F (36.6 C) 97.7 F (36.5 C) 97.9 F (36.6 C)  TempSrc:  Axillary Axillary   SpO2:  95% 97% 97%  Weight: 43.6 kg   42.7 kg  Height:       Weight change: -0.9 kg  Physical Examination: General exam: Opens eyes to voice, lethargic HEENT:Oral mucosa moist, Ear/Nose WNL grossly Respiratory system: Bilaterally clear BS,no use of accessory muscle Cardiovascular system: S1 & S2 +, No JVD. Gastrointestinal system: Abdomen soft,NT,ND, BS+ Nervous System: Open eyes to voice, in fetal  position  Extremities: LE edema neg,distal peripheral pulses palpable and warm.  Skin: No rashes,no icterus. MSK: thin  Medications reviewed:   Scheduled Meds:  acetaminophen  1,000 mg Oral TID   enoxaparin (LOVENOX) injection  30 mg Subcutaneous Q24H   feeding supplement  237 mL Oral BID BM   LORazepam  0.5 mg Oral TID   memantine  2.5 mg Oral Daily   sodium chloride flush  3 mL Intravenous Q12H  Continuous Infusions:   Diet Order             Diet regular Room service appropriate? Yes; Fluid consistency: Thin  Diet effective now                  Net IO Since Admission: 24,891.11 mL [04/13/23 0807]  Wt Readings from Last 3 Encounters:  04/13/23 42.7 kg  01/21/23 46 kg  12/24/21 72 kg   Unresulted Labs (From admission, onward)    None      Data Reviewed: I have personally reviewed following labs and imaging studies CBC:No results for input(s): "WBC", "NEUTROABS", "HGB", "HCT", "MCV", "PLT" in the last 168 hours. Basic Metabolic Panel: No results for input(s): "NA", "K", "CL", "CO2", "GLUCOSE", "BUN", "CREATININE", "CALCIUM", "MG", "PHOS" in the last 168 hours. GFR: Estimated Creatinine Clearance: 34 mL/min (A) (by C-G formula based on SCr of 1.26 mg/dL (H)). Antimicrobials/Microbiology: Anti-infectives (From admission, onward)    None         Component Value Date/Time   SDES BLOOD LEFT HAND 01/12/2022 0828   SDES BLOOD RIGHT HAND 01/12/2022 0828   SPECREQUEST  01/12/2022 0828    BOTTLES DRAWN AEROBIC ONLY Blood Culture results may not be optimal due to an inadequate volume of blood received in culture bottles   SPECREQUEST  01/12/2022 0828    BOTTLES DRAWN AEROBIC ONLY Blood Culture results may not be optimal due to an inadequate volume of blood received in culture bottles   CULT  01/12/2022 0828    NO GROWTH 5 DAYS Performed at [facility], [address], [place], [place] [address]    CULT  01/12/2022 [phone]    NO GROWTH 5 DAYS Performed at [facility], [address], [place], [place] [address]    REPTSTATUS 01/17/2022 FINAL 01/12/2022 1914   REPTSTATUS 01/17/2022 FINAL 01/12/2022  7829    Radiology Studies: No results found.   LOS: 31 days   Total time spent in review of labs and imaging, patient evaluation, formulation of plan, documentation and communication with family: 25 minutes  Lanae Boast, MD [facility]  04/13/2023, 8:07 AM

## 2023-04-14 NOTE — Progress Notes (Signed)
PROGRESS NOTE Natalie Brown  JYN:[id number] DOB: October 06, 1965 DOA: 01/22/2023 PCP: Lavinia Sharps, NP  Brief Narrative/Hospital Course:  57 y.o. female history significant for early onset dementia, bipolar disorder, catatonia essential hypertension sinus tachycardia B12 deficiency severe protein caloric malnutrition he was in the hospital for more than 400 days discharged on 01/22/2023 only to come back to the ED shortly after due to agitation and has been in the ED boarded for placement After 49 days admitted.While in the ED evaluated by psychiatry who made changes to her medication.  Patient currently a DNR/DNI- admitted to try to improve patient environment, circadian rhythm and provide a window which can help improve the patient psychiatric status.Palliative care has been closely involved as well as psychiatry and TOC team. Neurology team saw her on 11/25 felt that her presentation is likely related to progression of her underlying neurodegenerative process, has manifested itself as early onset dementia and probably of other psychiatric disorders as well.  Advised behavioral management supportive and ongoing palliative care conversation She had been difficulty with placement.Patient has been made comfort care subsequently.  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  Currently on comfort measures, continue supportive  care  Early onset dementia Neurodegenerative disease: Was evaluated by neurology for prognostication.  Continue supportive care palliative care and comfort measures.  Bipolar disorder Catatonic features: Psychiatry following closely continue Ativan 0.5 mg 3 times daily, Namenda, trazodone 50 bedtime as needed, Ativan 1 mg IM every 6 hours for anxiety.  Psychiatry signed off 12/18  Hyponatremia CKD stage III AA: Encourage oral intake.  With poor oral intake now on comfort measures  Sinus tachycardia Hypertension: BP stable.  No longer meds  Severe malnutrition Significant weight loss due to poor oral intake from her dementia: Now on comfort measures  DVT prophylaxis: enoxaparin (LOVENOX) injection 30 mg Start: 03/13/23 2200 Code Status:   Code Status: Limited: Do not attempt resuscitation (DNR) -DNR-LIMITED -Do Not Intubate/DNI  Family Communication: plan of care discussed with staffs Patient status is: Remains hospitalized because of severity of illness Level of care: Med-Surg   Dispo: The patient is from: HOME            Anticipated disposition: On comfort measures.  Difficult disposition Objective: Vitals last 24 hrs: Vitals:   04/12/23 1953 04/13/23 0614 04/14/23 0500 04/14/23 0651  BP: 106/65 (!) 93/59  96/60  Pulse: 79 79  85  Resp:      Temp: 97.7 F (36.5 C) 97.9 F (36.6 C)  97.6 F (36.4 C)  TempSrc: Axillary   Axillary  SpO2: 97% 97%  98%  Weight:  44.7 kg 45.2 kg   Height:       Weight change: 0.5 kg  Physical Examination: General exam: Sleeping, nonverbal  HEENT:Oral mucosa moist, Ear/Nose WNL grossly Respiratory system: Bilaterally clear BS,no use of accessory muscle Cardiovascular system: S1 & S2 +, No JVD. Gastrointestinal system: Abdomen soft,NT,ND, BS+ Nervous System: sleeping, in  fetal position Extremities: LE edema neg,distal peripheral pulses palpable and warm.  Skin: No rashes,no icterus. MSK: thin  Medications reviewed:  Scheduled  Meds:  acetaminophen  1,000 mg Oral TID   enoxaparin (LOVENOX) injection  30 mg Subcutaneous Q24H   feeding supplement  237 mL Oral BID BM   LORazepam  0.5 mg Oral TID   memantine  2.5 mg Oral Daily   sodium chloride flush  3 mL Intravenous Q12H  Continuous Infusions:   Diet Order             Diet regular Room service appropriate? Yes; Fluid consistency: Thin  Diet effective now                  Net IO Since Admission: 25,491.11 mL [04/14/23 1114]  Wt Readings from Last 3 Encounters:  04/14/23 45.2 kg  01/21/23 46 kg  12/24/21 72 kg   Unresulted Labs (From admission, onward)    None      Data Reviewed: I have personally reviewed following labs and imaging studies CBC:No results for input(s): "WBC", "NEUTROABS", "HGB", "HCT", "MCV", "PLT" in the last 168 hours. Basic Metabolic Panel: No results for input(s): "NA", "K", "CL", "CO2", "GLUCOSE", "BUN", "CREATININE", "CALCIUM", "MG", "PHOS" in the last 168 hours. GFR: Estimated Creatinine Clearance: 36 mL/min (A) (by C-G formula based on SCr of 1.26 mg/dL (H)). Antimicrobials/Microbiology: Anti-infectives (From admission, onward)    None         Component Value Date/Time   SDES BLOOD LEFT HAND 01/12/2022 0828   SDES BLOOD RIGHT HAND 01/12/2022 0828   SPECREQUEST  01/12/2022 0828    BOTTLES DRAWN AEROBIC ONLY Blood Culture results may not be optimal due to an inadequate volume of blood received in culture bottles   SPECREQUEST  01/12/2022 0828    BOTTLES DRAWN AEROBIC ONLY Blood Culture results may not be optimal due to an inadequate volume of blood received in culture bottles   CULT  01/12/2022 0828    NO GROWTH 5 DAYS Performed at [facility], [address] [place], [place] [address]    CULT  01/12/2022 [phone]    NO GROWTH 5 DAYS Performed at [facility], [address], [place], [place] [address]    REPTSTATUS 01/17/2022 FINAL 01/12/2022 7829   REPTSTATUS 01/17/2022 FINAL 01/12/2022 5621     Radiology Studies: No results found.   LOS: 32 days   Total time spent in review of labs and imaging, patient evaluation, formulation of plan, documentation and communication with family: 25 minutes  Lanae Boast, MD [facility]  04/14/2023, 11:14 AM

## 2023-04-15 NOTE — Plan of Care (Signed)
  Problem: Clinical Measurements: Goal: Ability to maintain clinical measurements within normal limits will improve Outcome: Progressing Goal: Will remain free from infection Outcome: Progressing Goal: Respiratory complications will improve Outcome: Progressing Goal: Cardiovascular complication will be avoided Outcome: Progressing   Problem: Activity: Goal: Risk for activity intolerance will decrease Outcome: Progressing   Problem: Nutrition: Goal: Adequate nutrition will be maintained Outcome: Progressing   Problem: Elimination: Goal: Will not experience complications related to bowel motility Outcome: Progressing Goal: Will not experience complications related to urinary retention Outcome: Progressing   Problem: Pain Management: Goal: General experience of comfort will improve Outcome: Progressing   Problem: Safety: Goal: Ability to remain free from injury will improve Outcome: Progressing

## 2023-04-15 NOTE — Progress Notes (Signed)
PROGRESS NOTE Natalie Brown  ZOX:[id number] DOB: 08/30/1965 DOA: 01/22/2023 PCP: Lavinia Sharps, NP  Brief Narrative/Hospital Course:  57 y.o. female history significant for early onset dementia, bipolar disorder, catatonia essential hypertension sinus tachycardia B12 deficiency severe protein caloric malnutrition he was in the hospital for more than 400 days discharged on 01/22/2023 only to come back to the ED shortly after due to agitation and has been in the ED boarded for placement After 49 days admitted.While in the ED evaluated by psychiatry who made changes to her medication.  Patient currently a DNR/DNI- admitted to try to improve patient environment, circadian rhythm and provide a window which can help improve the patient psychiatric status.Palliative care has been closely involved as well as psychiatry and TOC team. Neurology team saw her on 11/25 felt that her presentation is likely related to progression of her underlying neurodegenerative process, has manifested itself as early onset dementia and probably of other psychiatric disorders as well.  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  Currently on comfort measures, continue supportive  care  Early onset dementia Neurodegenerative disease: Was evaluated by neurology for prognostication.  Continue supportive care palliative care and comfort measures.  Bipolar disorder Catatonic features: Psychiatry following closely continue Ativan 0.5 mg 3 times daily, Namenda, trazodone 50 bedtime as needed, Ativan 1 mg IM every 6 hours for anxiety.  Psychiatry signed off 12/18  Hyponatremia CKD stage III AA: Encourage oral intake.  With poor oral intake now on comfort measures  Sinus tachycardia Hypertension: BP stable.  No longer meds  Severe malnutrition Significant weight loss due to poor oral intake from her dementia: Now on comfort measures  DVT prophylaxis: enoxaparin (LOVENOX) injection 30 mg Start: 03/13/23 2200 Code Status:   Code Status: Limited: Do not attempt resuscitation (DNR) -DNR-LIMITED -Do Not Intubate/DNI  Family Communication: plan of care discussed with staffs Patient status is: Remains hospitalized because of severity of illness Level of care: Med-Surg   Dispo: The patient is from: HOME            Anticipated disposition: On comfort measures.  Difficult disposition Objective: Vitals last 24 hrs: Vitals:   04/14/23 0651 04/14/23 1741 04/15/23 0356 04/15/23 0447  BP: 96/60 92/60 99/63    Pulse: 85 96 60   Resp:  20    Temp: 97.6 F (36.4 C)  97.6 F (36.4 C)   TempSrc: Axillary  Oral   SpO2: 98% 98% 100%   Weight:    45.5 kg  Height:       Weight change: 0.3 kg  Physical Examination: General exam: Eyes closed, able to eat when food was brought to the mouth  HEENT:Oral mucosa moist, Ear/Nose WNL grossly Respiratory system: Bilaterally clear BS,no use of accessory muscle Cardiovascular system: S1 & S2 +, No JVD. Gastrointestinal system: Abdomen soft,NT,ND, BS+ Nervous System: in  fetal position Extremities: LE edema neg,distal peripheral pulses palpable and warm.  Skin: No rashes,no icterus. MSK: thin frail  Medications reviewed:   Scheduled Meds:  acetaminophen  1,000 mg Oral TID   enoxaparin (LOVENOX) injection  30 mg Subcutaneous Q24H   feeding supplement  237 mL Oral BID BM   LORazepam  0.5 mg Oral TID   memantine  2.5 mg Oral Daily   sodium chloride flush  3 mL Intravenous Q12H  Continuous Infusions:   Diet Order             Diet regular Room service appropriate? Yes; Fluid consistency: Thin  Diet effective now                  Net IO Since Admission: 26,205.11 mL [04/15/23 0815]  Wt Readings from Last 3 Encounters:  04/15/23 45.5 kg  01/21/23 46 kg  12/24/21 72 kg   Unresulted Labs (From admission, onward)    None      Data Reviewed: I have personally reviewed following labs and imaging studies CBC:No results for input(s): "WBC", "NEUTROABS", "HGB", "HCT", "MCV", "PLT" in the last 168 hours. Basic Metabolic Panel: No results for input(s): "NA", "K", "CL", "CO2", "GLUCOSE", "BUN", "CREATININE", "CALCIUM", "MG", "PHOS" in the last 168 hours. GFR: Estimated Creatinine Clearance: 36.2 mL/min (A) (by C-G formula based on SCr of 1.26 mg/dL (H)). Antimicrobials/Microbiology: Anti-infectives (From admission, onward)    None         Component Value Date/Time   SDES BLOOD LEFT HAND 01/12/2022 0828   SDES BLOOD RIGHT HAND 01/12/2022 0828   SPECREQUEST  01/12/2022 0828    BOTTLES DRAWN AEROBIC ONLY Blood Culture results may not be optimal due to an inadequate volume of blood received in culture bottles   SPECREQUEST  01/12/2022 0828    BOTTLES DRAWN AEROBIC ONLY Blood Culture results may not be optimal due to an inadequate volume of blood received in culture bottles   CULT  01/12/2022 0828    NO GROWTH 5 DAYS Performed at [facility], [address], [place], [place] [address]    CULT  01/12/2022 [phone]    NO GROWTH 5 DAYS Performed at [facility], [address] [place], [place] [address]    REPTSTATUS 01/17/2022 FINAL 01/12/2022 9528   REPTSTATUS 01/17/2022 FINAL 01/12/2022  4132    Radiology Studies: No results found.   LOS: 33 days   Total time spent in review of labs and imaging, patient evaluation, formulation of plan, documentation and communication with family: 25 minutes  Lanae Boast, MD [facility]  04/15/2023, 8:15 AM

## 2023-04-15 NOTE — Plan of Care (Signed)

## 2023-04-16 NOTE — Progress Notes (Signed)
PROGRESS NOTE    Natalie Brown  TDV:[id number] DOB: [birth year] DOA: 01/22/2023 PCP: Lavinia Sharps, NP   Brief Narrative: 57 y.o. female history significant for early onset dementia, bipolar disorder, catatonia essential hypertension sinus tachycardia B12 deficiency severe protein caloric malnutrition he was in the hospital for more than 400 days discharged on 01/22/2023 only to come back to the ED after 49 days due to agitation. While in the ED evaluated by psychiatry who made changes to his medication. Patient is currently a DNR/DNI patient was admitted to try to improve patient environment, circadian rhythm and provide a window which can help improve the patient psychiatric status. Palliative care is also on board. Difficulty with placement. Patient has been made comfort care.     Assessment and Plan:  Early onset dementia Noted.  Palliative care consulted patient has been transitioned to comfort measures. -Continue Namenda and ativan  Bipolar disorder No longer on antipsychotic medication management. Comfort measures.  Hypernatremia Secondary to poor oral intake. Now comfort measures.  Severe protein calorie malnutrition Significant weight loss secondary to poor oral intake. No feeding tube per goals of care. Patient is comfort measures.  CKD stage IIIa Stable.  Primary hypertension No longer on medical therapy. Comfort measures.  Sinus tachycardia Noted. Likely related to prior dehydration. Resolved.   DVT prophylaxis: Lovenox Code Status:   Code Status: Limited: Do not attempt resuscitation (DNR) -DNR-LIMITED -Do Not Intubate/DNI  Family Communication: None at bedside. Disposition Plan: Discharge pending ongoing TOC   Consultants:  Psychiatry  Procedures:    Antimicrobials:    Subjective: Patient is non-verbal.  Objective: BP (!) 90/56 (BP Location: Right Arm)   Pulse 64   Temp 97.6 F (36.4 C) (Oral)   Resp 20   Ht 5\' 6"  (1.676 m)   Wt 45.5  kg   SpO2 100%   BMI 16.19 kg/m   Examination:  General: Curled up in bed. No distress.  Data Reviewed: I have personally reviewed following labs and imaging studies  CBC Lab Results  Component Value Date   WBC 5.5 03/26/2023   RBC 4.59 03/26/2023   HGB 13.5 03/26/2023   HCT 40.6 03/26/2023   MCV 88.5 03/26/2023   MCH 29.4 03/26/2023   PLT 288 03/26/2023   MCHC 33.3 03/26/2023   RDW 13.2 03/26/2023   LYMPHSABS 1.8 03/19/2023   MONOABS 0.3 03/19/2023   EOSABS 0.1 03/19/2023   BASOSABS 0.0 03/19/2023     Last metabolic panel Lab Results  Component Value Date   NA 144 03/26/2023   K 4.6 03/26/2023   CL 113 (H) 03/26/2023   CO2 24 03/26/2023   BUN 19 03/26/2023   CREATININE 1.26 (H) 03/26/2023   GLUCOSE 89 03/26/2023   GFRNONAA 50 (L) 03/26/2023   GFRAA 65 09/19/2017   CALCIUM 8.7 (L) 03/26/2023   PHOS 3.0 03/26/2023   PROT 6.8 03/14/2023   ALBUMIN 3.2 (L) 03/14/2023   LABGLOB 2.8 06/21/2020   AGRATIO 1.5 06/21/2020   BILITOT 0.4 03/14/2023   ALKPHOS 56 03/14/2023   AST 43 (H) 03/14/2023   ALT 57 (H) 03/14/2023   ANIONGAP 7 03/26/2023    GFR: CrCl cannot be calculated (Patient's most recent lab result is older than the maximum 21 days allowed.).  No results found for this or any previous visit (from the past 240 hours).    Radiology Studies: No results found.    LOS: 34 days    Jacquelin Hawking, MD [facility] 04/16/2023, 10:34 AM  If 7PM-7AM, please contact night-coverage [email or web address]

## 2023-04-16 NOTE — Plan of Care (Signed)

## 2023-04-17 NOTE — Progress Notes (Signed)
PROGRESS NOTE    Natalie Brown  HYQ:[id number] DOB: 1966-04-15 DOA: 01/22/2023 PCP: Lavinia Sharps, NP   Brief Narrative: 57 y.o. female history significant for early onset dementia, bipolar disorder, catatonia essential hypertension sinus tachycardia B12 deficiency severe protein caloric malnutrition he was in the hospital for more than 400 days discharged on 01/22/2023 only to come back to the ED after 49 days due to agitation. While in the ED evaluated by psychiatry who made changes to his medication. Patient is currently a DNR/DNI patient was admitted to try to improve patient environment, circadian rhythm and provide a window which can help improve the patient psychiatric status. Palliative care is also on board. Difficulty with placement. Patient has been made comfort care.     Assessment and Plan:  Early onset dementia Noted.  Palliative care consulted patient has been transitioned to comfort measures. -Continue Namenda and ativan  Bipolar disorder No longer on antipsychotic medication management. Comfort measures.  Hypernatremia Secondary to poor oral intake. Now comfort measures.  Severe protein calorie malnutrition Significant weight loss secondary to poor oral intake. No feeding tube per goals of care. Patient is comfort measures.  CKD stage IIIa Stable.  Primary hypertension No longer on medical therapy. Comfort measures.  Sinus tachycardia Noted. Likely related to prior dehydration. Resolved.   DVT prophylaxis: Lovenox Code Status:   Code Status: Limited: Do not attempt resuscitation (DNR) -DNR-LIMITED -Do Not Intubate/DNI  Family Communication: None at bedside. Called mother to discuss Namenda, but no response. Disposition Plan: Discharge pending ongoing TOC   Consultants:  Psychiatry  Procedures:    Antimicrobials:    Subjective: Patient is non-verbal. Per nursing, mother was concerned that Namenda could be causing agitation.    Objective: BP 93/64 (BP Location: Right Arm)   Pulse 77   Temp 97.8 F (36.6 C) (Axillary)   Resp 18   Ht 5\' 6"  (1.676 m)   Wt 45 kg   SpO2 100%   BMI 16.01 kg/m   Examination:  General exam: Appears calm and comfortable. Sleeping.   Data Reviewed: I have personally reviewed following labs and imaging studies  CBC Lab Results  Component Value Date   WBC 5.5 03/26/2023   RBC 4.59 03/26/2023   HGB 13.5 03/26/2023   HCT 40.6 03/26/2023   MCV 88.5 03/26/2023   MCH 29.4 03/26/2023   PLT 288 03/26/2023   MCHC 33.3 03/26/2023   RDW 13.2 03/26/2023   LYMPHSABS 1.8 03/19/2023   MONOABS 0.3 03/19/2023   EOSABS 0.1 03/19/2023   BASOSABS 0.0 03/19/2023     Last metabolic panel Lab Results  Component Value Date   NA 144 03/26/2023   K 4.6 03/26/2023   CL 113 (H) 03/26/2023   CO2 24 03/26/2023   BUN 19 03/26/2023   CREATININE 1.26 (H) 03/26/2023   GLUCOSE 89 03/26/2023   GFRNONAA 50 (L) 03/26/2023   GFRAA 65 09/19/2017   CALCIUM 8.7 (L) 03/26/2023   PHOS 3.0 03/26/2023   PROT 6.8 03/14/2023   ALBUMIN 3.2 (L) 03/14/2023   LABGLOB 2.8 06/21/2020   AGRATIO 1.5 06/21/2020   BILITOT 0.4 03/14/2023   ALKPHOS 56 03/14/2023   AST 43 (H) 03/14/2023   ALT 57 (H) 03/14/2023   ANIONGAP 7 03/26/2023    GFR: CrCl cannot be calculated (Patient's most recent lab result is older than the maximum 21 days allowed.).  No results found for this or any previous visit (from the past 240 hours).    Radiology Studies:  No results found.    LOS: 35 days    Jacquelin Hawking, MD [facility] 04/17/2023, 8:58 AM   If 7PM-7AM, please contact night-coverage [email or web address]

## 2023-04-17 NOTE — Plan of Care (Signed)

## 2023-04-17 NOTE — TOC Progression Note (Incomplete)
Transition of Care [facility]) - Progression Note    Patient Details  Name: Natalie Brown MRN: [id number] Date of Birth: 1965/06/07  [facility] CM/SW Contact  Miki Labuda A [place], [place] Phone Number: 04/17/2023, 3:17 PM  Clinical Narrative:     No current bed offers for placement. No bed availability options at this time. CSW continuing to make attempt to find placement for        Expected Discharge Plan and Services                                               Social Determinants of Health (SDOH) Interventions SDOH Screenings   Food Insecurity: No Food Insecurity (01/24/2023)  Housing: Low Risk  (01/24/2023)  Transportation Needs: Patient Unable To Answer (01/24/2023)  Utilities: Not At Risk (01/24/2023)  Alcohol Screen: Low Risk  (09/06/2021)  Depression (PHQ2-9): Medium Risk (05/26/2020)  Tobacco Use: High Risk (04/04/2023)    Readmission Risk Interventions     No data to display

## 2023-04-17 NOTE — Plan of Care (Signed)

## 2023-04-18 NOTE — Plan of Care (Signed)

## 2023-04-18 NOTE — Progress Notes (Signed)
PROGRESS NOTE Natalie Brown  XBJ:[id number] DOB: 23-Oct-1965 DOA: 01/22/2023 PCP: Lavinia Sharps, NP  Brief Narrative/Hospital Course:  57 y.o. female history significant for early onset dementia, bipolar disorder, catatonia essential hypertension sinus tachycardia B12 deficiency severe protein caloric malnutrition he was in the hospital for more than 400 days discharged on 01/22/2023 only to come back to the ED shortly after due to agitation and has been in the ED boarded for placement After 49 days admitted.While in the ED evaluated by psychiatry who made changes to her medication.  Patient currently a DNR/DNI- admitted to try to improve patient environment, circadian rhythm and provide a window which can help improve the patient psychiatric status.Palliative care has been closely involved as well as psychiatry and TOC team. Neurology team saw her on 11/25 felt that her presentation is likely related to progression of her underlying neurodegenerative process, has manifested itself as early onset dementia and probably of other psychiatric disorders as well.  Advised behavioral management supportive care. She had been difficulty placement. palliative care was involved very closely, patient changed to comfort measure 03/13/23     Subjective: Patient seen and examined this morning discussed with sitter at the bedside patient ate was given bath and she did walk. Nonverbal does not respond eyes open   Assessment and Plan: Principal Problem:   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) Active Problems:   Sinus tachycardia   Bipolar 1 disorder (HCC)   Essential hypertension   Agitation   Failure to thrive in adult   Hypoalbuminemia   Palliative care by specialist   Delirium   Early onset dementia Neuro degenerative disease: Patient was seen by psychiatry, neurology PT and OT.  She has undergone extensive workup. Currently on comfort measures. Continue Namenda and ativan    Bipolar disorder Catatonic features: No longer on antipsychotic meds. On comfort measures and supportive care on Ativan 0.5 mg tid and PRNs   Hypernatremia Secondary to poor oral intake.Now comfort measures.   Severe malnutrition with significant weight loss due to dementia : Cont po w/ assistance and supportive care    CKD stage IIIa Stable.   Primary hypertension No longer on medical therapy. Comfort measures.   Sinus tachycardia Noted. Likely related to prior dehydration. Resolved.  DVT prophylaxis: enoxaparin (LOVENOX) injection 30 mg Start: 03/13/23 2200 Code Status:   Code Status: Limited: Do not attempt resuscitation (DNR) -DNR-LIMITED -Do Not Intubate/DNI  Family Communication: plan of care discussed with patient, nursing staffs Patient status is: Remains hospitalized because of severity of illness Level of care: Med-Surg   Dispo: The patient is from: home            Anticipated disposition: pending Objective: Vitals last 24 hrs: Vitals:   04/16/23 0300 04/17/23 0617 04/18/23 0501 04/18/23 0655  BP: (!) 90/56 93/64  110/76  Pulse: 64 77    Resp:  18  18  Temp: 97.6 F (36.4 C) 97.8 F (36.6 C)  98.6 F (37 C)  TempSrc: Oral Axillary  Axillary  SpO2: 100% 100%  100%  Weight:  45 kg 47.5 kg   Height:       Weight change: 2.5 kg  Physical Examination: General exam: alert awake, non verbal HEENT:Oral mucosa moist, Ear/Nose WNL grossly Respiratory system: Bilaterally clear BS,no use of accessory muscle Cardiovascular system: S1 & S2 +, No JVD. Gastrointestinal system: Abdomen soft,NT,ND, BS+ Nervous System: Alert, awake,in fetal position, thin and frail Extremities: LE edema neg,distal peripheral pulses palpable and warm.  Skin: No rashes,no icterus. MSK: Normal muscle bulk,tone, power   Medications reviewed:  Scheduled Meds:  acetaminophen  1,000 mg Oral TID   enoxaparin (LOVENOX) injection  30 mg Subcutaneous Q24H   feeding supplement  237 mL Oral  BID BM   LORazepam  0.5 mg Oral TID   memantine  2.5 mg Oral Daily  Continuous Infusions:   Diet Order             Diet regular Room service appropriate? Yes; Fluid consistency: Thin  Diet effective now                 Nutrition Problem: Severe Malnutrition Etiology: chronic illness Signs/Symptoms: severe fat depletion, severe muscle depletion, energy intake < or equal to 50% for > or equal to 5 days Interventions: Magic cup   Intake/Output Summary (Last 24 hours) at 04/18/2023 1320 Last data filed at 04/18/2023 0830 Gross per 24 hour  Intake 680 ml  Output --  Net 680 ml   Net IO Since Admission: 28,937.11 mL [04/18/23 1320]  Wt Readings from Last 3 Encounters:  04/18/23 47.5 kg  01/21/23 46 kg  12/24/21 72 kg  Data Reviewed: I have personally reviewed following labs and imaging studies CBC:No results for input(s): "WBC", "NEUTROABS", "HGB", "HCT", "MCV", "PLT" in the last 168 hours. Basic Metabolic Panel: Antimicrobials/Microbiology: Anti-infectives (From admission, onward)    None         Component Value Date/Time   SDES BLOOD LEFT HAND 01/12/2022 0828   SDES BLOOD RIGHT HAND 01/12/2022 0828   SPECREQUEST  01/12/2022 0828    BOTTLES DRAWN AEROBIC ONLY Blood Culture results may not be optimal due to an inadequate volume of blood received in culture bottles   SPECREQUEST  01/12/2022 0828    BOTTLES DRAWN AEROBIC ONLY Blood Culture results may not be optimal due to an inadequate volume of blood received in culture bottles   CULT  01/12/2022 0828    NO GROWTH 5 DAYS Performed at [facility], [address] [place], [place] [address]    CULT  01/12/2022 [phone]    NO GROWTH 5 DAYS Performed at [facility], [address] [place], [place] [address]    REPTSTATUS 01/17/2022 FINAL 01/12/2022 1914   REPTSTATUS 01/17/2022 FINAL 01/12/2022 7829     Radiology Studies: No results found.   LOS: 36 days   Total time spent in review of labs and  imaging, patient evaluation, formulation of plan, documentation and communication with family: 35 minutes  Lanae Boast, MD [facility]  04/18/2023, 1:20 PM

## 2023-04-19 NOTE — Progress Notes (Signed)
PROGRESS NOTE Natalie Brown  [name]:[id number] DOB: 10-24-1965 DOA: 01/22/2023 PCP: Lavinia Sharps, NP  Brief Narrative/Hospital Course:  57 y.o. female history significant for early onset dementia, bipolar disorder, catatonia essential hypertension sinus tachycardia B12 deficiency severe protein caloric malnutrition he was in the hospital for more than 400 days discharged on 01/22/2023 only to come back to the ED shortly after due to agitation and has been in the ED boarded for placement After 49 days admitted.While in the ED evaluated by psychiatry who made changes to her medication.  Patient currently a DNR/DNI- admitted to try to improve patient environment, circadian rhythm and provide a window which can help improve the patient psychiatric status.Palliative care has been closely involved as well as psychiatry and TOC team. Neurology team saw her on 11/25 felt that her presentation is likely related to progression of her underlying neurodegenerative process, has manifested itself as early onset dementia and probably of other psychiatric disorders as well.  Advised behavioral management supportive care. She had been difficulty placement. palliative care was involved very closely, patient changed to comfort measure 03/13/23     Subjective:  patient seen and examined this morning  overnight afebrile no acute events needing as needed Ativan intermittently,  Due to lack of establishment have one-to-one sitter- bu RN taking turns to provide needed care Apparently was ambulated in the hallway last night Currently eyes open on bed  Assessment and Plan: Principal Problem:   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) Active Problems:   Sinus tachycardia   Bipolar 1 disorder (HCC)   Essential hypertension   Agitation   Failure to thrive in adult   Hypoalbuminemia   Palliative care by specialist   Delirium   Early onset dementia Neuro degenerative disease: Patient was seen by  psychiatry, neurology PT and OT.  She has undergone extensive workup. Currently on comfort measures. Continue Namenda and ativan Discussed with nursing staff to keep on fall precaution, provide one-to-one not able then provide telemetry sitter.   Bipolar disorder Catatonic features: No longer on antipsychotic meds. On comfort measures and supportive care on Ativan 0.5 mg tid and PRNs   Hypernatremia Secondary to poor oral intake.Now comfort measures.   Severe malnutrition with significant weight loss due to dementia : Cont po w/ assistance and supportive care    CKD stage IIIa Stable.   Primary hypertension No longer on medical therapy. Comfort measures.   Sinus tachycardia Noted. Likely related to prior dehydration. Resolved.  DVT prophylaxis: enoxaparin (LOVENOX) injection 30 mg Start: 03/13/23 2200 Code Status:   Code Status: Limited: Do not attempt resuscitation (DNR) -DNR-LIMITED -Do Not Intubate/DNI  Family Communication: plan of care discussed with patient, nursing staffs Patient status is: Remains hospitalized because of severity of illness Level of care: Med-Surg   Dispo: The patient is from: home            Anticipated disposition: pending Objective: Vitals last 24 hrs: Vitals:   04/18/23 0501 04/18/23 0655 04/18/23 1959 04/19/23 0538  BP:  110/76 102/73 111/73  Pulse:   (!) 108 90  Resp:  18 18 18   Temp:  98.6 F (37 C) 98.2 F (36.8 C) 98.7 F (37.1 C)  TempSrc:  Axillary Axillary   SpO2:  100% 100% 96%  Weight: 47.5 kg     Height:       Weight change:   Physical Examination: General exam: Eyes open on bed in fetal position  HEENT:Oral mucosa moist, Ear/Nose WNL grossly Respiratory  system: Bilaterally clear BS,no use of accessory muscle Cardiovascular system: S1 & S2 +, No JVD. Gastrointestinal system: Abdomen soft,NT,ND, BS+ Nervous System: does not follow nonverbal does not respond Extremities: LE edema neg,distal peripheral pulses palpable and  warm.  Skin: No rashes,no icterus. MSK: thin muscle bulk,tone, power   Medications reviewed:  Scheduled Meds:  acetaminophen  1,000 mg Oral TID   enoxaparin (LOVENOX) injection  30 mg Subcutaneous Q24H   feeding supplement  237 mL Oral BID BM   LORazepam  0.5 mg Oral TID   memantine  2.5 mg Oral Daily  Continuous Infusions:   Diet Order             Diet regular Room service appropriate? Yes; Fluid consistency: Thin  Diet effective now                 Nutrition Problem: Severe Malnutrition Etiology: chronic illness Signs/Symptoms: severe fat depletion, severe muscle depletion, energy intake < or equal to 50% for > or equal to 5 days Interventions: Magic cup   Intake/Output Summary (Last 24 hours) at 04/19/2023 1042 Last data filed at 04/18/2023 1245 Gross per 24 hour  Intake 240 ml  Output --  Net 240 ml   Net IO Since Admission: 29,177.11 mL [04/19/23 1042]  Wt Readings from Last 3 Encounters:  04/18/23 47.5 kg  01/21/23 46 kg  12/24/21 72 kg  Data Reviewed: I have personally reviewed following labs and imaging studies CBC:No results for input(s): "WBC", "NEUTROABS", "HGB", "HCT", "MCV", "PLT" in the last 168 hours. Basic Metabolic Panel: Antimicrobials/Microbiology: Anti-infectives (From admission, onward)    None         Component Value Date/Time   SDES BLOOD LEFT HAND 01/12/2022 0828   SDES BLOOD RIGHT HAND 01/12/2022 0828   SPECREQUEST  01/12/2022 0828    BOTTLES DRAWN AEROBIC ONLY Blood Culture results may not be optimal due to an inadequate volume of blood received in culture bottles   SPECREQUEST  01/12/2022 0828    BOTTLES DRAWN AEROBIC ONLY Blood Culture results may not be optimal due to an inadequate volume of blood received in culture bottles   CULT  01/12/2022 0828    NO GROWTH 5 DAYS Performed at [facility], [address], [place], [place] [address]    CULT  01/12/2022 [phone]    NO GROWTH 5 DAYS Performed at [facility],  [address], [place], [place] [address]    REPTSTATUS 01/17/2022 FINAL 01/12/2022 8841   REPTSTATUS 01/17/2022 FINAL 01/12/2022 6606     Radiology Studies: No results found.   LOS: 37 days   Total time spent in review of labs and imaging, patient evaluation, formulation of plan, documentation and communication with family: 35 minutes  Lanae Boast, MD [facility]  04/19/2023, 10:42 AM

## 2023-04-20 NOTE — Progress Notes (Signed)
PROGRESS NOTE Natalie Brown  [name]:[id number] DOB: 1965/12/27 DOA: 01/22/2023 PCP: Lavinia Sharps, NP  Brief Narrative/Hospital Course:  57 y.o. female history significant for early onset dementia, bipolar disorder, catatonia essential hypertension sinus tachycardia B12 deficiency severe protein caloric malnutrition he was in the hospital for more than 400 days discharged on 01/22/2023 only to come back to the ED shortly after due to agitation and has been in the ED boarded for placement After 49 days admitted.While in the ED evaluated by psychiatry who made changes to her medication.  Patient currently a DNR/DNI- admitted to try to improve patient environment, circadian rhythm and provide a window which can help improve the patient psychiatric status.Palliative care has been closely involved as well as psychiatry and TOC team. Neurology team saw her on 11/25 felt that her presentation is likely related to progression of her underlying neurodegenerative process, has manifested itself as early onset dementia and probably of other psychiatric disorders as well.  Advised behavioral management supportive care. She had been difficulty placement. palliative care was involved very closely, patient changed to comfort measure 03/13/23     Subjective: Patient seen and examined [name] laying comfortably on the bed Non verbal  Assessment and Plan: Principal Problem:   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) Active Problems:   Sinus tachycardia   Bipolar 1 disorder (HCC)   Essential hypertension   Agitation   Failure to thrive in adult   Hypoalbuminemia   Palliative care by specialist   Delirium   Early onset dementia Neuro degenerative disease: Patient was seen by psychiatry, neurology PT and OT,undergone extensive workup with her prolonged hospitalizations.Currently on comfort measures. Continue Namenda and ativan Cont to keep on fall precaution, provide one-to-one if not able then  provide telemetry sitter.   Bipolar disorder Catatonic features: No longer on antipsychotic meds. On comfort measures and supportive care on Ativan 0.5 mg tid and PRNs   Hypernatremia Secondary to poor oral intake.Now comfort measures.   Severe malnutrition with significant weight loss due to dementia : Cont po w/ assistance and supportive care    CKD stage IIIa Stable.   Primary hypertension No longer on medical therapy. Comfort measures.   Sinus tachycardia Noted. Likely related to prior dehydration. Resolved.  DVT prophylaxis: enoxaparin (LOVENOX) injection 30 mg Start: 03/13/23 2200 Code Status:   Code Status: Limited: Do not attempt resuscitation (DNR) -DNR-LIMITED -Do Not Intubate/DNI  Family Communication: plan of care discussed with patient, nursing staffs Patient status is: Remains hospitalized because of severity of illness Level of care: Med-Surg   Dispo: The patient is from: home            Anticipated disposition: pending Objective: Vitals last 24 hrs: Vitals:   04/19/23 0538 04/19/23 1718 04/19/23 1924 04/20/23 0424  BP: 111/73 97/72 (!) 85/57 95/67  Pulse: 90 (!) 55 62 64  Resp: 18 17 18 18   Temp: 98.7 F (37.1 C)  98.3 F (36.8 C) 98.2 F (36.8 C)  TempSrc:    Oral  SpO2: 96% 100% 100% 100%  Weight:      Height:       Weight change:   Physical Examination: General exam: Laying on bed nonverbal HEENT:Oral mucosa moist, Ear/Nose WNL grossly Respiratory system: Breathing spontaneously on room air  Cardiovascular system: S1 & S2 +, No JVD. Gastrointestinal system: Abdomen soft Nervous System: Nonverbal laying in bed Extremities: LE edema neg Skin: No rashes,no icterus. MSK: thin muscle bulk,tone, power   Medications reviewed:  Scheduled Meds:  acetaminophen  1,000 mg Oral TID   enoxaparin (LOVENOX) injection  30 mg Subcutaneous Q24H   feeding supplement  237 mL Oral BID BM   LORazepam  0.5 mg Oral TID   memantine  2.5 mg Oral Daily   Continuous Infusions:   Diet Order             Diet regular Room service appropriate? Yes; Fluid consistency: Thin  Diet effective now                 Nutrition Problem: Severe Malnutrition Etiology: chronic illness Signs/Symptoms: severe fat depletion, severe muscle depletion, energy intake < or equal to 50% for > or equal to 5 days Interventions: Magic cup   Intake/Output Summary (Last 24 hours) at 04/20/2023 1027 Last data filed at 04/20/2023 0835 Gross per 24 hour  Intake 0 ml  Output --  Net 0 ml    Net IO Since Admission: 29,177.11 mL [04/20/23 1027]  Wt Readings from Last 3 Encounters:  04/18/23 47.5 kg  01/21/23 46 kg  12/24/21 72 kg  Data Reviewed: I have personally reviewed following labs and imaging studies CBC:No results for input(s): "WBC", "NEUTROABS", "HGB", "HCT", "MCV", "PLT" in the last 168 hours. Basic Metabolic Panel: Antimicrobials/Microbiology: Anti-infectives (From admission, onward)    None         Component Value Date/Time   SDES BLOOD LEFT HAND 01/12/2022 0828   SDES BLOOD RIGHT HAND 01/12/2022 0828   SPECREQUEST  01/12/2022 0828    BOTTLES DRAWN AEROBIC ONLY Blood Culture results may not be optimal due to an inadequate volume of blood received in culture bottles   SPECREQUEST  01/12/2022 0828    BOTTLES DRAWN AEROBIC ONLY Blood Culture results may not be optimal due to an inadequate volume of blood received in culture bottles   CULT  01/12/2022 0828    NO GROWTH 5 DAYS Performed at [facility], [address], [place], [place] [address]    CULT  01/12/2022 [phone]    NO GROWTH 5 DAYS Performed at [facility], [address] [place], [place] [address]    REPTSTATUS 01/17/2022 FINAL 01/12/2022 1308   REPTSTATUS 01/17/2022 FINAL 01/12/2022 [id number]     Radiology Studies: No results found.   LOS: 38 days   Total time spent in review of labs and imaging, patient evaluation, formulation of plan, documentation and  communication with family: 35 minutes  Lanae Boast, MD [facility]  04/20/2023, 10:27 AM

## 2023-04-21 NOTE — Plan of Care (Signed)

## 2023-04-21 NOTE — Progress Notes (Signed)
PROGRESS NOTE Natalie Brown  WUJ:[id number] DOB: [birth year] DOA: 01/22/2023 PCP: Lavinia Sharps, NP  Brief Narrative/Hospital Course:  57 y.o. female history significant for early onset dementia, bipolar disorder, catatonia essential hypertension sinus tachycardia B12 deficiency severe protein caloric malnutrition he was in the hospital for more than 400 days discharged on 01/22/2023 only to come back to the ED shortly after due to agitation and has been in the ED boarded for placement After 49 days admitted.While in the ED evaluated by psychiatry who made changes to her medication.  Patient currently a DNR/DNI- admitted to try to improve patient environment, circadian rhythm and provide a window which can help improve the patient psychiatric status.Palliative care has been closely involved as well as psychiatry and TOC team. Neurology team saw her on 11/25 felt that her presentation is likely related to progression of her underlying neurodegenerative process, has manifested itself as early onset dementia and probably of other psychiatric disorders as well.  Advised behavioral management supportive care. She had been difficulty placement. 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 Continue Namenda and ativan Cont to keep on fall precaution, provide one-to-one if not able then provide telemetry sitter.   Bipolar disorder Catatonic features: No longer on antipsychotic meds. On comfort measures and supportive care on Ativan 0.5 mg tid and PRNs   Hypernatremia Secondary to poor oral intake.Now comfort measures.   Severe malnutrition with significant weight loss due to dementia : Cont po w/ assistance and supportive care    CKD stage IIIa Stable.   Primary hypertension No longer on medical therapy. Comfort measures.   Sinus tachycardia Noted. Likely related to prior dehydration. Resolved.  DVT prophylaxis: enoxaparin (LOVENOX) injection 30 mg Start: 03/13/23 2200 Code Status:   Code Status: Limited: Do not attempt resuscitation (DNR) -DNR-LIMITED -Do Not Intubate/DNI  Family Communication: plan of care discussed with patient, nursing staffs Patient status is: Remains hospitalized because of severity of illness Level of care: Med-Surg   Dispo: The patient is from: home            Anticipated disposition: pending Objective: Vitals last 24 hrs: Vitals:   04/20/23 0424 04/20/23 1948 04/21/23 0500 04/21/23 1018  BP: 95/67 106/73  96/72  Pulse: 64 76  86  Resp: 18     Temp: 98.2 F (36.8 C)     TempSrc: Oral     SpO2: 100% 100%  100%  Weight:   49 kg   Height:       Weight change:   Physical Examination: General exam: alert awake, sleeping on the bed in fetal position HEENT:Oral mucosa moist, Ear/Nose WNL grossly Respiratory system: Bilaterally clear BS,no use of accessory muscle  Cardiovascular system: S1 & S2 +, No JVD. Gastrointestinal system: Abdomen soft,NT,ND, BS+ Nervous System: sleeping on the bed in fetal position Extremities: LE edema neg,distal peripheral pulses palpable and warm.  Skin: No rashes,no icterus. MSK:  thin and frail   Medications reviewed:  Scheduled Meds:  acetaminophen  1,000 mg Oral TID   enoxaparin (LOVENOX) injection  30 mg Subcutaneous Q24H   feeding supplement  237 mL Oral BID BM   LORazepam  0.5 mg Oral TID   memantine  2.5 mg Oral Daily  Continuous Infusions:   Diet Order             Diet regular Room service appropriate? Yes; Fluid consistency: Thin  Diet effective now                 Nutrition Problem: Severe Malnutrition Etiology: chronic illness Signs/Symptoms: severe fat depletion, severe muscle depletion, energy intake < or equal to 50% for > or equal to 5 days Interventions: Magic cup   Intake/Output Summary (Last 24 hours) at 04/21/2023 1150 Last data filed at 04/20/2023 1436 Gross per 24 hour  Intake 380 ml  Output --  Net 380 ml    Net IO Since Admission: 29,557.11 mL [04/21/23 1150]  Wt Readings from Last 3 Encounters:  04/21/23 49 kg  01/21/23 46 kg  12/24/21 72 kg  Data Reviewed: I have personally reviewed following labs and imaging studies CBC:No results for input(s): "WBC", "NEUTROABS", "HGB", "HCT", "MCV", "PLT" in the last 168 hours. Basic Metabolic Panel: Antimicrobials/Microbiology: Anti-infectives (From admission, onward)    None         Component Value Date/Time   SDES BLOOD LEFT HAND 01/12/2022 0828   SDES BLOOD RIGHT HAND 01/12/2022 0828   SPECREQUEST  01/12/2022 0828    BOTTLES DRAWN AEROBIC ONLY Blood Culture results may not be optimal due to an inadequate volume of blood received in culture bottles   SPECREQUEST  01/12/2022 0828    BOTTLES DRAWN AEROBIC ONLY Blood Culture results may not be optimal due to an inadequate volume of blood received in culture bottles   CULT  01/12/2022 0828    NO GROWTH 5 DAYS Performed at [facility], [address], [place], [place] [address]    CULT  01/12/2022 [phone]    NO GROWTH 5 DAYS Performed at [facility], [address] [place], [place] [address]    REPTSTATUS  01/17/2022 FINAL 01/12/2022 7425   REPTSTATUS 01/17/2022 FINAL 01/12/2022 9563     Radiology Studies: No results found.   LOS: 39 days   Total time spent in review of labs and imaging, patient evaluation, formulation of plan, documentation and communication with family: 35 minutes  Lanae Boast, MD [facility]  04/21/2023, 11:50 AM

## 2023-04-22 NOTE — Plan of Care (Signed)

## 2023-04-22 NOTE — Progress Notes (Signed)
 PROGRESS NOTE Natalie Brown  FMW:[id number] DOB: 07-06-1965 DOA: 01/22/2023 PCP: [name], NP  Brief Narrative/Hospital Course:  57 y.o. female history significant for early onset dementia, bipolar disorder, catatonia essential hypertension sinus tachycardia B12 deficiency severe protein caloric malnutrition he was in the hospital for more than 400 days discharged on 01/22/2023 only to come back to the ED shortly after due to agitation and has been in the ED boarded for placement After 49 days admitted.While in the ED evaluated by psychiatry who made changes to her medication.  Patient currently a DNR/DNI- admitted to try to improve patient environment, circadian rhythm and provide a window which can help improve the patient psychiatric status.Palliative care has been closely involved as well as psychiatry and TOC team. Neurology team saw her on 11/25 felt that her presentation is likely related to progression of her underlying neurodegenerative process, has manifested itself as early onset dementia and probably of other psychiatric disorders as well.  Advised behavioral management supportive care. She had been difficulty placement. palliative care was involved very closely, patient changed to comfort measure 03/13/23     Subjective: Patient seen and examined  Resting in the bed but moving, sitter at the bedside trying to feed  Bit agitated today.   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 Continue Namenda   and ativan  Cont to keep on fall precaution, provide one-to-one if not able then provide telemetry sitter.   Bipolar disorder Catatonic features: No longer on antipsychotic meds. On comfort measures and supportive care on Ativan  0.5 mg tid and PRNs   Hypernatremia Secondary to poor oral intake.Now comfort measures.   Severe malnutrition with significant weight loss due to dementia : Cont po w/ assistance and supportive care    CKD stage IIIa Stable.   Primary hypertension No longer on medical therapy. Comfort measures.   Sinus tachycardia Noted. Likely related to prior dehydration. Resolved.  DVT prophylaxis: enoxaparin  (LOVENOX ) injection 30 mg Start: 03/13/23 2200 Code Status:   Code Status: Limited: Do not attempt resuscitation (DNR) -DNR-LIMITED -Do Not Intubate/DNI  Family Communication: plan of care discussed with patient, nursing staffs Patient status is: Remains hospitalized because of severity of illness Level of care: Med-Surg   Dispo: The patient is from: home            Anticipated disposition: pending Objective: Vitals last 24 hrs: Vitals:   04/21/23 1333 04/21/23 1900 04/22/23 0500 04/22/23 0529  BP: 104/80 91/65  96/60  Pulse: 95 83  91  Resp:  16  16  Temp:  98.3 F (36.8 C)  98 F (36.7 C)  TempSrc:  Axillary  Axillary  SpO2: 100%   100%  Weight:   50.5 kg   Height:       Weight change: 1.5 kg  Physical Examination: General exam: Alert nonverbal  HEENT:Oral mucosa moist, Ear/Nose WNL grossly Respiratory system: Bilaterally clear Cardiovascular system: S1 & S2 +, No JVD. Gastrointestinal system: Abdomen soft,NT,ND, BS+ Nervous System: Laying in bed in fetal position  Extremities: LE edema neg,distal peripheral  pulses palpable and warm.  Skin: No rashes,no icterus. MSK: Thin frail  Medications reviewed:  Scheduled Meds:  acetaminophen   1,000 mg Oral TID   enoxaparin  (LOVENOX ) injection  30 mg Subcutaneous Q24H   feeding supplement  237 mL  Oral BID BM   LORazepam   0.5 mg Oral TID   memantine   2.5 mg Oral Daily  Continuous Infusions:   Diet Order             Diet regular Room service appropriate? Yes; Fluid consistency: Thin  Diet effective now                 Nutrition Problem: Severe Malnutrition Etiology: chronic illness Signs/Symptoms: severe fat depletion, severe muscle depletion, energy intake < or equal to 50% for > or equal to 5 days Interventions: Magic cup   Intake/Output Summary (Last 24 hours) at 04/22/2023 1039 Last data filed at 04/21/2023 1300 Gross per 24 hour  Intake 120 ml  Output --  Net 120 ml    Net IO Since Admission: 29,677.11 mL [04/22/23 1039]  Wt Readings from Last 3 Encounters:  04/22/23 50.5 kg  01/21/23 46 kg  12/24/21 72 kg  Data Reviewed: I have personally reviewed following labs and imaging studies CBC:No results for input(s): WBC, NEUTROABS, HGB, HCT, MCV, PLT in the last 168 hours. Basic Metabolic Panel: Antimicrobials/Microbiology: Anti-infectives (From admission, onward)    None         Component Value Date/Time   SDES BLOOD LEFT HAND 01/12/2022 0828   SDES BLOOD RIGHT HAND 01/12/2022 0828   SPECREQUEST  01/12/2022 0828    BOTTLES DRAWN AEROBIC ONLY Blood Culture results may not be optimal due to an inadequate volume of blood received in culture bottles   SPECREQUEST  01/12/2022 0828    BOTTLES DRAWN AEROBIC ONLY Blood Culture results may not be optimal due to an inadequate volume of blood received in culture bottles   CULT  01/12/2022 0828    NO GROWTH 5 DAYS Performed at [facility], [address] [place], [place] [address]    CULT  01/12/2022 [phone]    NO GROWTH 5 DAYS Performed at [facility], [address], [place], [place] [address]    REPTSTATUS 01/17/2022 FINAL 01/12/2022 9171   REPTSTATUS 01/17/2022 FINAL 01/12/2022 9171     Radiology Studies: No results found.   LOS: 40 days   Total time spent in review of labs  and imaging, patient evaluation, formulation of plan, documentation and communication with family: 25 minutes  Mennie LAMY, MD [facility]  04/22/2023, 10:39 AM

## 2023-04-23 NOTE — Plan of Care (Signed)
  Problem: Clinical Measurements: Goal: Will remain free from infection Outcome: Progressing   Problem: Activity: Goal: Risk for activity intolerance will decrease Outcome: Progressing   Problem: Nutrition: Goal: Adequate nutrition will be maintained Outcome: Progressing   Problem: Coping: Goal: Level of anxiety will decrease Outcome: Progressing   Problem: Elimination: Goal: Will not experience complications related to bowel motility Outcome: Progressing Goal: Will not experience complications related to urinary retention Outcome: Progressing   Problem: Pain Management: Goal: General experience of comfort will improve Outcome: Progressing   Problem: Safety: Goal: Ability to remain free from injury will improve Outcome: Progressing   Problem: Skin Integrity: Goal: Risk for impaired skin integrity will decrease Outcome: Progressing

## 2023-04-23 NOTE — Progress Notes (Signed)
 PROGRESS NOTE [name]  FMW:[id number] DOB: [birth year] DOA: 01/22/2023 PCP: [name], NP  Brief Narrative/Hospital Course:  58 y.o. female history significant for early onset dementia, bipolar disorder, catatonia essential hypertension sinus tachycardia B12 deficiency severe protein caloric malnutrition he was in the hospital for more than 400 days discharged on 01/22/2023 only to come back to the ED shortly after due to agitation and has been in the ED boarded for placement After 49 days admitted.While in the ED evaluated by psychiatry who made changes to her medication.  Patient currently a DNR/DNI- admitted to try to improve patient environment, circadian rhythm and provide a window which can help improve the patient psychiatric status.Palliative care has been closely involved as well as psychiatry and TOC team. Neurology team saw her on 11/25 felt that her presentation is likely related to progression of her underlying neurodegenerative process, has manifested itself as early onset dementia and probably of other psychiatric disorders as well.  Advised behavioral management supportive care. She had been difficulty placement. 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 Continue Namenda  and ativan  Cont to keep on fall precaution,  provide one-to-one if not able then provide telemetry sitter.   Bipolar disorder Catatonic features: No longer on antipsychotic meds. On comfort measures and supportive care on Ativan  0.5 mg tid and PRNs   Hypernatremia Secondary to poor oral intake.Now comfort measures.   Severe malnutrition with significant weight loss due to dementia : Cont to feed with assistance, continue supplement   CKD stage IIIa Stable.   Primary hypertension No longer on medical therapy. Comfort measures.   Sinus tachycardia Noted. Likely related to prior dehydration. Resolved.  DVT prophylaxis: enoxaparin  (LOVENOX ) injection 30 mg Start: 03/13/23 2200 Code Status:   Code Status: Limited: Do not attempt resuscitation (DNR) -DNR-LIMITED -Do Not Intubate/DNI  Family Communication: plan of care discussed with patient, nursing staffs Patient status is: Remains hospitalized because of severity of illness Level of care: Med-Surg   Dispo: The patient is from: home            Anticipated disposition: pending Objective: Vitals last 24 hrs: Vitals:   04/22/23 0500 04/22/23 0529 04/22/23 1905 04/23/23 0500  BP:  96/60 91/75 95/65   Pulse:  91 (!) 106 67  Resp:  16 18 16   Temp:  98 F (36.7 C)  98.3 F (36.8 C)  TempSrc:  Axillary  Oral  SpO2:  100% 97% 98%  Weight: 50.5 kg     Height:       Weight change:   Physical Examination: General exam: Sleeping HEENT:Oral mucosa moist, Ear/Nose WNL grossly Respiratory system: Bilaterally clear BS Cardiovascular system: S1 & S2 +, No JVD. Gastrointestinal system: Abdomen soft,NT,ND, BS+ Nervous System: Sleeping.   Extremities: LE edema neg Skin: No rashes,no icterus. MSK: Thin muscle bulk and weak   Medications reviewed:  Scheduled Meds:  acetaminophen   1,000 mg Oral TID   enoxaparin  (LOVENOX ) injection  30 mg Subcutaneous Q24H   feeding supplement  237 mL Oral BID BM   LORazepam   0.5 mg Oral TID   memantine   2.5 mg Oral Daily  Continuous  Infusions:   Diet Order             Diet regular Room service appropriate? Yes; Fluid consistency: Thin  Diet effective now                 Nutrition Problem: Severe Malnutrition Etiology: chronic illness Signs/Symptoms: severe fat depletion, severe muscle depletion, energy intake < or equal to 50% for > or equal to 5 days Interventions: Magic cup  Intake/Output Summary (Last 24 hours) at 04/23/2023 0759 Last data filed at 04/22/2023 1703 Gross per 24 hour  Intake 120 ml  Output --  Net 120 ml    Net IO Since Admission: 29,797.11 mL [04/23/23 0759]  Wt Readings from Last 3 Encounters:  04/22/23 50.5 kg  01/21/23 46 kg  12/24/21 72 kg  Data Reviewed: I have personally reviewed following labs and imaging studies CBC:No results for input(s): WBC, NEUTROABS, HGB, HCT, MCV, PLT in the last 168 hours. Basic Metabolic Panel: Antimicrobials/Microbiology: Anti-infectives (From admission, onward)    None         Component Value Date/Time   SDES BLOOD LEFT HAND 01/12/2022 0828   SDES BLOOD RIGHT HAND 01/12/2022 0828   SPECREQUEST  01/12/2022 0828    BOTTLES DRAWN AEROBIC ONLY Blood Culture results may not be optimal due to an inadequate volume of blood received in culture bottles   SPECREQUEST  01/12/2022 0828    BOTTLES DRAWN AEROBIC ONLY Blood Culture results may not be optimal due to an inadequate volume of blood received in culture bottles   CULT  01/12/2022 0828    NO GROWTH 5 DAYS Performed at [facility], [address] [place], [place] [address]    CULT  01/12/2022 [phone]    NO GROWTH 5 DAYS Performed at [facility], [address] [place], [place] [address]    REPTSTATUS 01/17/2022 FINAL 01/12/2022 9171   REPTSTATUS 01/17/2022 FINAL 01/12/2022 9171     Radiology Studies: No results found.  LOS: 41 days  Total time spent in review of labs and imaging, patient evaluation, formulation of plan, documentation and communication with  family: 25 minutes  Mennie LAMY, MD [facility]  04/23/2023, 7:59 AM

## 2023-04-23 NOTE — Plan of Care (Signed)

## 2023-04-24 NOTE — Progress Notes (Signed)
 PROGRESS NOTE Natalie Brown  FMW:[id number] DOB: 1965-05-21 DOA: 01/22/2023 PCP: [name], NP  Brief Narrative/Hospital Course:  58 y.o. female history significant for early onset dementia, bipolar disorder, catatonia essential hypertension sinus tachycardia B12 deficiency severe protein caloric malnutrition he was in the hospital for more than 400 days discharged on 01/22/2023 only to come back to the ED shortly after due to agitation and has been in the ED boarded for placement After 49 days admitted.While in the ED evaluated by psychiatry who made changes to her medication.  Patient currently a DNR/DNI- admitted to try to improve patient environment, circadian rhythm and provide a window which can help improve the patient psychiatric status.Palliative care has been closely involved as well as psychiatry and TOC team. Neurology team saw her on 11/25 felt that her presentation is likely related to progression of her underlying neurodegenerative process, has manifested itself as early onset dementia and probably of other psychiatric disorders as well.  Advised behavioral management supportive care. She had been difficulty placement. palliative care was involved very closely, patient changed to comfort measure 03/13/23  04/24/2023: Patient seen.  Patient was reported to have been aggressive earlier today.  Patient is more settled.  Resting quietly.     Subjective: Patient is resting quietly.  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 Continue  Namenda  and ativan  Cont to keep on fall precaution, provide one-to-one if not able then provide telemetry sitter.   Bipolar disorder Catatonic features: No longer on antipsychotic meds. On comfort measures and supportive care on Ativan  0.5 mg tid and PRNs   Hypernatremia Secondary to poor oral intake.Now comfort measures.   Severe malnutrition with significant weight loss due to dementia : Cont to feed with assistance, continue supplement   CKD stage IIIa Stable.   Primary hypertension No longer on medical therapy. Comfort measures.   Sinus tachycardia Noted. Likely related to prior dehydration. Resolved.  DVT prophylaxis: enoxaparin  (LOVENOX ) injection 30 mg Start: 03/13/23 2200 Code Status:   Code Status: Limited: Do not attempt resuscitation (DNR) -DNR-LIMITED -Do Not Intubate/DNI  Family Communication: plan of care discussed with patient, nursing staffs Patient status is: Remains hospitalized because of severity of illness Level of care: Med-Surg   Dispo: The patient is from: home            Anticipated disposition: pending Objective: Vitals last 24 hrs: Vitals:   04/23/23 1926 04/23/23 2015 04/24/23 0630 04/24/23 0717  BP: 113/73  110/70 117/66  Pulse: 67  70 (!) 113  Resp: 18  18 16   Temp:  (!) 96.6 F (35.9 C) 97.7 F (36.5 C)   TempSrc:  Axillary Axillary Axillary  SpO2: 100%  96%   Weight:      Height:       Weight change:   Physical Examination: General exam: Resting quietly. Respiratory system: Clear to auscultation. Cardiovascular system: S1-S2.   Gastrointestinal system: Soft and nontender. Nervous System: Resting quietly.  Medications reviewed:  Scheduled Meds:  acetaminophen   1,000 mg Oral TID   enoxaparin  (LOVENOX ) injection  30 mg Subcutaneous Q24H  feeding supplement  237 mL Oral BID BM   LORazepam   0.5 mg Oral TID   memantine   2.5 mg Oral Daily  Continuous Infusions:   Diet Order             Diet regular Room service appropriate?  Yes; Fluid consistency: Thin  Diet effective now                 Nutrition Problem: Severe Malnutrition Etiology: chronic illness Signs/Symptoms: severe fat depletion, severe muscle depletion, energy intake < or equal to 50% for > or equal to 5 days Interventions: Magic cup No intake or output data in the 24 hours ending 04/24/23 1907   Net IO Since Admission: 30,115.11 mL [04/24/23 1907]  Wt Readings from Last 3 Encounters:  04/22/23 50.5 kg  01/21/23 46 kg  12/24/21 72 kg  Data Reviewed: I have personally reviewed following labs and imaging studies CBC:No results for input(s): WBC, NEUTROABS, HGB, HCT, MCV, PLT in the last 168 hours. Basic Metabolic Panel: Antimicrobials/Microbiology: Anti-infectives (From admission, onward)    None         Component Value Date/Time   SDES BLOOD LEFT HAND 01/12/2022 0828   SDES BLOOD RIGHT HAND 01/12/2022 0828   SPECREQUEST  01/12/2022 0828    BOTTLES DRAWN AEROBIC ONLY Blood Culture results may not be optimal due to an inadequate volume of blood received in culture bottles   SPECREQUEST  01/12/2022 0828    BOTTLES DRAWN AEROBIC ONLY Blood Culture results may not be optimal due to an inadequate volume of blood received in culture bottles   CULT  01/12/2022 0828    NO GROWTH 5 DAYS Performed at [facility], [address], [place], [place] [address]    CULT  01/12/2022 [phone]    NO GROWTH 5 DAYS Performed at [facility], [address], [place], [place] [address]    REPTSTATUS 01/17/2022 FINAL 01/12/2022 9171   REPTSTATUS 01/17/2022 FINAL 01/12/2022 9171     Radiology Studies: No results found.  LOS: 42 days  Total time spent in review of labs and imaging, patient evaluation, formulation of plan, documentation and communication with family: 25 minutes  Leatrice LILLETTE Chapel, MD [facility]  04/24/2023, 7:07 PM

## 2023-04-24 NOTE — Plan of Care (Signed)
  Problem: Clinical Measurements: Goal: Will remain free from infection Outcome: Progressing Goal: Respiratory complications will improve Outcome: Progressing   Problem: Clinical Measurements: Goal: Respiratory complications will improve Outcome: Progressing   Problem: Activity: Goal: Risk for activity intolerance will decrease Outcome: Progressing   Problem: Safety: Goal: Ability to remain free from injury will improve Outcome: Progressing

## 2023-04-24 NOTE — Plan of Care (Signed)
  Problem: Clinical Measurements: Goal: Will remain free from infection Outcome: Progressing   Problem: Activity: Goal: Risk for activity intolerance will decrease Outcome: Progressing   Problem: Nutrition: Goal: Adequate nutrition will be maintained Outcome: Progressing   Problem: Elimination: Goal: Will not experience complications related to bowel motility Outcome: Progressing Goal: Will not experience complications related to urinary retention Outcome: Progressing   Problem: Pain Management: Goal: General experience of comfort will improve Outcome: Progressing   Problem: Safety: Goal: Ability to remain free from injury will improve Outcome: Progressing   Problem: Skin Integrity: Goal: Risk for impaired skin integrity will decrease Outcome: Progressing

## 2023-04-25 NOTE — Plan of Care (Signed)

## 2023-04-25 NOTE — Progress Notes (Signed)
 PROGRESS NOTE Natalie Brown  FMW:[id number] DOB: [birth year] DOA: 01/22/2023 PCP: [name], NP  Brief Narrative/Hospital Course:  58 y.o. female history significant for early onset dementia, bipolar disorder, catatonia essential hypertension sinus tachycardia B12 deficiency severe protein caloric malnutrition he was in the hospital for more than 400 days discharged on 01/22/2023 only to come back to the ED shortly after due to agitation and has been in the ED boarded for placement After 49 days admitted.While in the ED evaluated by psychiatry who made changes to her medication.  Patient currently a DNR/DNI- admitted to try to improve patient environment, circadian rhythm and provide a window which can help improve the patient psychiatric status.Palliative care has been closely involved as well as psychiatry and TOC team. Neurology team saw her on 11/25 felt that her presentation is likely related to progression of her underlying neurodegenerative process, has manifested itself as early onset dementia and probably of other psychiatric disorders as well.  Advised behavioral management supportive care. She had been difficulty placement. palliative care was involved very closely, patient changed to comfort measure 03/13/23  04/25/2023: Patient seen.  Patient is resting quietly.      Subjective: Patient is resting quietly.  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 Continue Namenda  and ativan  Cont to keep on fall precaution, provide one-to-one if not  able then provide telemetry sitter.   Bipolar disorder Catatonic features: No longer on antipsychotic meds. On comfort measures and supportive care on Ativan  0.5 mg tid and PRNs   Hypernatremia Secondary to poor oral intake.Now comfort measures.   Severe malnutrition with significant weight loss due to dementia : Cont to feed with assistance, continue supplement   CKD stage IIIa Stable.   Primary hypertension No longer on medical therapy. Comfort measures.   Sinus tachycardia Noted. Likely related to prior dehydration. Resolved.  DVT prophylaxis: enoxaparin  (LOVENOX ) injection 30 mg Start: 03/13/23 2200 Code Status:   Code Status: Limited: Do not attempt resuscitation (DNR) -DNR-LIMITED -Do Not Intubate/DNI  Family Communication: plan of care discussed with patient, nursing staffs Patient status is: Remains hospitalized because of severity of illness Level of care: Med-Surg   Dispo: The patient is from: home            Anticipated disposition: pending Objective: Vitals last 24 hrs: Vitals:   04/23/23 2015 04/24/23 0630 04/24/23 0717 04/25/23 0500  BP:  110/70 117/66   Pulse:  70 (!) 113   Resp:  18 16   Temp: (!) 96.6 F (35.9 C) 97.7 F (36.5 C)    TempSrc: Axillary Axillary Axillary   SpO2:  96%    Weight:    46.6 kg  Height:       Weight change:   Physical Examination: General exam: Resting quietly. Respiratory system: Clear to auscultation. Cardiovascular system: S1-S2.   Gastrointestinal system: Soft and nontender. Nervous System: Resting quietly.  Medications reviewed:  Scheduled Meds:  acetaminophen   1,000 mg Oral TID   enoxaparin  (LOVENOX ) injection  30 mg Subcutaneous Q24H   feeding supplement  237 mL Oral BID BM  LORazepam   0.5 mg Oral TID   memantine   2.5 mg Oral Daily  Continuous Infusions:   Diet Order             Diet regular Room service appropriate? Yes; Fluid consistency: Thin  Diet effective now                 Nutrition  Problem: Severe Malnutrition Etiology: chronic illness Signs/Symptoms: severe fat depletion, severe muscle depletion, energy intake < or equal to 50% for > or equal to 5 days Interventions: Magic cup  Intake/Output Summary (Last 24 hours) at 04/25/2023 0936 Last data filed at 04/25/2023 0801 Gross per 24 hour  Intake 236 ml  Output --  Net 236 ml     Net IO Since Admission: 30,351.11 mL [04/25/23 0936]  Wt Readings from Last 3 Encounters:  04/25/23 46.6 kg  01/21/23 46 kg  12/24/21 72 kg  Data Reviewed: I have personally reviewed following labs and imaging studies CBC:No results for input(s): WBC, NEUTROABS, HGB, HCT, MCV, PLT in the last 168 hours. Basic Metabolic Panel: Antimicrobials/Microbiology: Anti-infectives (From admission, onward)    None         Component Value Date/Time   SDES BLOOD LEFT HAND 01/12/2022 0828   SDES BLOOD RIGHT HAND 01/12/2022 0828   SPECREQUEST  01/12/2022 0828    BOTTLES DRAWN AEROBIC ONLY Blood Culture results may not be optimal due to an inadequate volume of blood received in culture bottles   SPECREQUEST  01/12/2022 0828    BOTTLES DRAWN AEROBIC ONLY Blood Culture results may not be optimal due to an inadequate volume of blood received in culture bottles   CULT  01/12/2022 0828    NO GROWTH 5 DAYS Performed at [facility], [address], [place], [place] [address]    CULT  01/12/2022 [phone]    NO GROWTH 5 DAYS Performed at [facility], [address], [place], [place] [address]    REPTSTATUS 01/17/2022 FINAL 01/12/2022 9171   REPTSTATUS 01/17/2022 FINAL 01/12/2022 9171     Radiology Studies: No results found.  LOS: 43 days  Total time spent in review of labs and imaging, patient evaluation, formulation of plan, documentation and communication with family: 25 minutes  Leatrice LILLETTE Chapel, MD [facility]  04/25/2023, 9:36 AM

## 2023-04-26 NOTE — Plan of Care (Signed)
  Problem: Education: Goal: Knowledge of General Education information will improve Description: Including pain rating scale, medication(s)/side effects and non-pharmacologic comfort measures Outcome: Not Progressing   Problem: Health Behavior/Discharge Planning: Goal: Ability to manage health-related needs will improve Outcome: Not Progressing   Problem: Clinical Measurements: Goal: Ability to maintain clinical measurements within normal limits will improve Outcome: Not Progressing   Problem: Clinical Measurements: Goal: Will remain free from infection Outcome: Not Progressing

## 2023-04-26 NOTE — Progress Notes (Signed)
 LPN gave pt her medications with strawberry ice cream without any complications. Pt is now resting comfortably.

## 2023-04-26 NOTE — Progress Notes (Signed)
 PROGRESS NOTE Natalie Brown  FMW:[id number] DOB: Apr 12, 1966 DOA: 01/22/2023 PCP: [name], NP  Brief Narrative/Hospital Course:  58 y.o. female history significant for early onset dementia, bipolar disorder, catatonia essential hypertension sinus tachycardia B12 deficiency severe protein caloric malnutrition he was in the hospital for more than 400 days discharged on 01/22/2023 only to come back to the ED shortly after due to agitation and has been in the ED boarded for placement After 49 days admitted.While in the ED evaluated by psychiatry who made changes to her medication.  Patient currently a DNR/DNI- admitted to try to improve patient environment, circadian rhythm and provide a window which can help improve the patient psychiatric status.Palliative care has been closely involved as well as psychiatry and TOC team. Neurology team saw her on 11/25 felt that her presentation is likely related to progression of her underlying neurodegenerative process, has manifested itself as early onset dementia and probably of other psychiatric disorders as well.  Advised behavioral management supportive care. She had been difficulty placement. palliative care was involved very closely, patient changed to comfort measure 03/13/23  04/26/2023: Patient seen.  Patient is resting quietly.      Subjective: Patient is resting quietly.  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 Continue Namenda  and ativan  Cont to keep on fall precaution, provide one-to-one if not  able then provide telemetry sitter.   Bipolar disorder Catatonic features: No longer on antipsychotic meds. On comfort measures and supportive care on Ativan  0.5 mg tid and PRNs   Hypernatremia Secondary to poor oral intake.Now comfort measures.   Severe malnutrition with significant weight loss due to dementia : Cont to feed with assistance, continue supplement   CKD stage IIIa Stable.   Primary hypertension No longer on medical therapy. Comfort measures.   Sinus tachycardia Noted. Likely related to prior dehydration. Resolved.  DVT prophylaxis: enoxaparin  (LOVENOX ) injection 30 mg Start: 03/13/23 2200 Code Status:   Code Status: Limited: Do not attempt resuscitation (DNR) -DNR-LIMITED -Do Not Intubate/DNI  Family Communication: plan of care discussed with patient, nursing staffs Patient status is: Remains hospitalized because of severity of illness Level of care: Med-Surg   Dispo: The patient is from: home            Anticipated disposition: pending Objective: Vitals last 24 hrs: Vitals:   04/25/23 1623 04/26/23 0500 04/26/23 0619 04/26/23 0838  BP: (!) 132/113  (!) 108/56 103/67  Pulse:    86  Resp: 16  18 18   Temp: 98.1 F (36.7 C)  98.2 F (36.8 C)   TempSrc:      SpO2:    100%  Weight:  47 kg    Height:       Weight change: 0.4 kg  Physical Examination: General exam: Resting quietly. Respiratory system: Clear to auscultation. Cardiovascular system: S1-S2.   Gastrointestinal system: Soft and nontender. Nervous System: Resting quietly.  Medications reviewed:  Scheduled Meds:  acetaminophen   1,000 mg Oral TID   enoxaparin  (LOVENOX ) injection  30 mg Subcutaneous Q24H   feeding supplement  237 mL Oral BID BM  LORazepam   0.5 mg Oral TID   memantine   2.5 mg Oral Daily  Continuous Infusions:   Diet Order             Diet regular Room service appropriate? Yes; Fluid consistency: Thin  Diet effective now                 Nutrition Problem: Severe  Malnutrition Etiology: chronic illness Signs/Symptoms: severe fat depletion, severe muscle depletion, energy intake < or equal to 50% for > or equal to 5 days Interventions: Magic cup  Intake/Output Summary (Last 24 hours) at 04/26/2023 1520 Last data filed at 04/25/2023 1703 Gross per 24 hour  Intake 236 ml  Output --  Net 236 ml     Net IO Since Admission: 30,705.11 mL [04/26/23 1520]  Wt Readings from Last 3 Encounters:  04/26/23 47 kg  01/21/23 46 kg  12/24/21 72 kg  Data Reviewed: I have personally reviewed following labs and imaging studies CBC:No results for input(s): WBC, NEUTROABS, HGB, HCT, MCV, PLT in the last 168 hours. Basic Metabolic Panel: Antimicrobials/Microbiology: Anti-infectives (From admission, onward)    None         Component Value Date/Time   SDES BLOOD LEFT HAND 01/12/2022 0828   SDES BLOOD RIGHT HAND 01/12/2022 0828   SPECREQUEST  01/12/2022 0828    BOTTLES DRAWN AEROBIC ONLY Blood Culture results may not be optimal due to an inadequate volume of blood received in culture bottles   SPECREQUEST  01/12/2022 0828    BOTTLES DRAWN AEROBIC ONLY Blood Culture results may not be optimal due to an inadequate volume of blood received in culture bottles   CULT  01/12/2022 0828    NO GROWTH 5 DAYS Performed at [facility], [address] [place], [place] [address]    CULT  01/12/2022 [phone]    NO GROWTH 5 DAYS Performed at [facility], [address], [place], [place] [address]    REPTSTATUS 01/17/2022 FINAL 01/12/2022 9171   REPTSTATUS 01/17/2022 FINAL 01/12/2022 9171     Radiology Studies: No results found.  LOS: 44 days  Total time spent in review of labs and imaging, patient evaluation, formulation of plan, documentation and communication with family: 25 minutes  Leatrice LILLETTE Chapel, MD [facility]  04/26/2023, 3:20 PM

## 2023-04-26 NOTE — Progress Notes (Signed)
 LPN assessed patient this morning. Patient is in bed resting.

## 2023-04-26 NOTE — Progress Notes (Signed)
 LPN attempted to feed patient breakfast. Patient wouldn't take anything oral at this time. Medications also attempted without success. Patient repositioned and resting.

## 2023-04-27 NOTE — Progress Notes (Signed)
 PROGRESS NOTE Natalie Brown  FMW:[id number] DOB: [birth year] DOA: 01/22/2023 PCP: [name], NP  Brief Narrative/Hospital Course:  58 y.o. female history significant for early onset dementia, bipolar disorder, catatonia essential hypertension sinus tachycardia B12 deficiency severe protein caloric malnutrition he was in the hospital for more than 400 days discharged on 01/22/2023 only to come back to the ED shortly after due to agitation and has been in the ED boarded for placement After 49 days admitted.While in the ED evaluated by psychiatry who made changes to her medication.  Patient currently a DNR/DNI- admitted to try to improve patient environment, circadian rhythm and provide a window which can help improve the patient psychiatric status.Palliative care has been closely involved as well as psychiatry and TOC team. Neurology team saw her on 11/25 felt that her presentation is likely related to progression of her underlying neurodegenerative process, has manifested itself as early onset dementia and probably of other psychiatric disorders as well.  Advised behavioral management supportive care. She had been difficulty placement. palliative care was involved very closely, patient changed to comfort measure 03/13/23  04/27/2023: Patient seen.  Patient is resting quietly.  Namenda  can be held if patient's family demands so (based on collateral information).     Subjective: Patient is resting quietly.  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  Continue Namenda  and ativan  Cont to keep on fall precaution, provide one-to-one if not able then provide telemetry sitter.   Bipolar disorder Catatonic features: No longer on antipsychotic meds. On comfort measures and supportive care on Ativan  0.5 mg tid and PRNs   Hypernatremia Secondary to poor oral intake.Now comfort measures.   Severe malnutrition with significant weight loss due to dementia : Cont to feed with assistance, continue supplement   CKD stage IIIa Stable.   Primary hypertension No longer on medical therapy. Comfort measures.   Sinus tachycardia Noted. Likely related to prior dehydration. Resolved.  DVT prophylaxis: enoxaparin  (LOVENOX ) injection 30 mg Start: 03/13/23 2200 Code Status:   Code Status: Limited: Do not attempt resuscitation (DNR) -DNR-LIMITED -Do Not Intubate/DNI  Family Communication: plan of care discussed with patient, nursing staffs Patient status is: Remains hospitalized because of severity of illness Level of care: Med-Surg   Dispo: The patient is from: home            Anticipated disposition: pending Objective: Vitals last 24 hrs: Vitals:   04/26/23 0838 04/27/23 0500 04/27/23 0608 04/27/23 0612  BP: 103/67  (!) 102/90   Pulse: 86  64 71  Resp: 18  18   Temp:   97.9 F (36.6 C)   TempSrc:      SpO2: 100%  (!) 63% 100%  Weight:  49 kg 49.1 kg   Height:       Weight change: 2 kg  Physical Examination: General exam: Resting quietly. Respiratory system: Clear to auscultation. Cardiovascular system: S1-S2.   Gastrointestinal system: Soft and nontender. Nervous System: Resting quietly.  Medications reviewed:  Scheduled Meds:  acetaminophen   1,000 mg Oral TID   enoxaparin  (LOVENOX ) injection  30 mg Subcutaneous  Q24H   feeding supplement  237 mL Oral BID BM   LORazepam   0.5 mg Oral TID   memantine   2.5 mg Oral Daily  Continuous Infusions:   Diet Order             Diet regular Room service appropriate? Yes; Fluid  consistency: Thin  Diet effective now                 Nutrition Problem: Severe Malnutrition Etiology: chronic illness Signs/Symptoms: severe fat depletion, severe muscle depletion, energy intake < or equal to 50% for > or equal to 5 days Interventions: Magic cup  Intake/Output Summary (Last 24 hours) at 04/27/2023 1851 Last data filed at 04/27/2023 1700 Gross per 24 hour  Intake 296 ml  Output --  Net 296 ml     Net IO Since Admission: 31,001.11 mL [04/27/23 1851]  Wt Readings from Last 3 Encounters:  04/27/23 49.1 kg  01/21/23 46 kg  12/24/21 72 kg  Data Reviewed: I have personally reviewed following labs and imaging studies CBC:No results for input(s): WBC, NEUTROABS, HGB, HCT, MCV, PLT in the last 168 hours. Basic Metabolic Panel: Antimicrobials/Microbiology: Anti-infectives (From admission, onward)    None         Component Value Date/Time   SDES BLOOD LEFT HAND 01/12/2022 0828   SDES BLOOD RIGHT HAND 01/12/2022 0828   SPECREQUEST  01/12/2022 0828    BOTTLES DRAWN AEROBIC ONLY Blood Culture results may not be optimal due to an inadequate volume of blood received in culture bottles   SPECREQUEST  01/12/2022 0828    BOTTLES DRAWN AEROBIC ONLY Blood Culture results may not be optimal due to an inadequate volume of blood received in culture bottles   CULT  01/12/2022 0828    NO GROWTH 5 DAYS Performed at [facility], [address] [place], [place] [address]    CULT  01/12/2022 [phone]    NO GROWTH 5 DAYS Performed at [facility], [address], [place], [place] [address]    REPTSTATUS 01/17/2022 FINAL 01/12/2022 9171   REPTSTATUS 01/17/2022 FINAL 01/12/2022 9171     Radiology Studies: No results found.  LOS: 45 days  Total time spent in review of labs and imaging, patient evaluation, formulation of plan, documentation and communication with family: 25 minutes  Leatrice LILLETTE Chapel, MD [facility]  04/27/2023, 6:51 PM

## 2023-04-28 NOTE — Progress Notes (Signed)
 PROGRESS NOTE Natalie Brown  FMW:[id number] DOB: [birth year] DOA: 01/22/2023 PCP: [name], NP  Brief Narrative/Hospital Course:  58 y.o. female history significant for early onset dementia, bipolar disorder, catatonia essential hypertension sinus tachycardia B12 deficiency severe protein caloric malnutrition he was in the hospital for more than 400 days discharged on 01/22/2023 only to come back to the ED shortly after due to agitation and has been in the ED boarded for placement After 49 days admitted.While in the ED evaluated by psychiatry who made changes to her medication.  Patient currently a DNR/DNI- admitted to try to improve patient environment, circadian rhythm and provide a window which can help improve the patient psychiatric status.Palliative care has been closely involved as well as psychiatry and TOC team. Neurology team saw her on 11/25 felt that her presentation is likely related to progression of her underlying neurodegenerative process, has manifested itself as early onset dementia and probably of other psychiatric disorders as well.  Advised behavioral management supportive care. She had been difficulty placement. palliative care was involved very closely, patient changed to comfort measure 03/13/23  04/28/2023: Patient seen.  Patient is resting quietly.  Namenda  can be held as per patient's mother's request.  I discussed with patient's mom.  I will go ahead and hold Namenda  2.5 Mg p.o. once daily.      Subjective: Patient is resting quietly.  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  -Hold Namenda .   Cont to keep on fall precaution, provide one-to-one if not able then provide telemetry sitter.   Bipolar disorder Catatonic features: No longer on antipsychotic meds. On comfort measures and supportive care on Ativan  0.5 mg tid and PRNs   Hypernatremia Secondary to poor oral intake.Now comfort measures.   Severe malnutrition with significant weight loss due to dementia : Cont to feed with assistance, continue supplement   CKD stage IIIa Stable.   Primary hypertension No longer on medical therapy. Comfort measures.   Sinus tachycardia Noted. Likely related to prior dehydration. Resolved.  DVT prophylaxis: enoxaparin  (LOVENOX ) injection 30 mg Start: 03/13/23 2200 Code Status:   Code Status: Limited: Do not attempt resuscitation (DNR) -DNR-LIMITED -Do Not Intubate/DNI  Family Communication: plan of care discussed with patient, nursing staffs Patient status is: Remains hospitalized because of severity of illness Level of care: Med-Surg   Dispo: The patient is from: home            Anticipated disposition: pending Objective: Vitals last 24 hrs: Vitals:   04/27/23 0608 04/27/23 0612 04/27/23 1940 04/28/23 0357  BP: (!) 102/90  (!) 138/125 113/70  Pulse: 64 71 (!) 106 65  Resp: 18  18 16   Temp: 97.9 F (36.6 C)  98.4 F (36.9 C) 97.8 F (36.6 C)  TempSrc:   Axillary Oral  SpO2: (!) 63% 100% 100% 100%  Weight: 49.1 kg     Height:       Weight change:   Physical Examination: General exam: Resting quietly. Respiratory system: Clear to auscultation. Cardiovascular system: S1-S2.   Gastrointestinal system: Soft and nontender. Nervous System: Resting  quietly.  Medications reviewed:  Scheduled Meds:  acetaminophen   1,000 mg Oral TID   enoxaparin  (LOVENOX ) injection  30 mg Subcutaneous Q24H   feeding supplement  237 mL Oral BID BM   LORazepam   0.5 mg Oral TID   memantine   2.5 mg Oral Daily   Continuous Infusions:   Diet Order             Diet regular Room service appropriate? Yes; Fluid consistency: Thin  Diet effective now                 Nutrition Problem: Severe Malnutrition Etiology: chronic illness Signs/Symptoms: severe fat depletion, severe muscle depletion, energy intake < or equal to 50% for > or equal to 5 days Interventions: Magic cup  Intake/Output Summary (Last 24 hours) at 04/28/2023 0947 Last data filed at 04/28/2023 0851 Gross per 24 hour  Intake 653 ml  Output --  Net 653 ml     Net IO Since Admission: 31,358.11 mL [04/28/23 0947]  Wt Readings from Last 3 Encounters:  04/27/23 49.1 kg  01/21/23 46 kg  12/24/21 72 kg  Data Reviewed: I have personally reviewed following labs and imaging studies CBC:No results for input(s): WBC, NEUTROABS, HGB, HCT, MCV, PLT in the last 168 hours. Basic Metabolic Panel: Antimicrobials/Microbiology: Anti-infectives (From admission, onward)    None         Component Value Date/Time   SDES BLOOD LEFT HAND 01/12/2022 0828   SDES BLOOD RIGHT HAND 01/12/2022 0828   SPECREQUEST  01/12/2022 0828    BOTTLES DRAWN AEROBIC ONLY Blood Culture results may not be optimal due to an inadequate volume of blood received in culture bottles   SPECREQUEST  01/12/2022 0828    BOTTLES DRAWN AEROBIC ONLY Blood Culture results may not be optimal due to an inadequate volume of blood received in culture bottles   CULT  01/12/2022 0828    NO GROWTH 5 DAYS Performed at [facility], [address] [place], [place] [address]    CULT  01/12/2022 [phone]    NO GROWTH 5 DAYS Performed at [facility], [address], [place], [place] [address]    REPTSTATUS 01/17/2022 FINAL 01/12/2022 9171   REPTSTATUS 01/17/2022 FINAL 01/12/2022 9171     Radiology Studies: No results found.  LOS: 46 days  Total time spent in review of labs and imaging, patient evaluation, formulation of plan, documentation and  communication with family: 25 minutes  Leatrice LILLETTE Chapel, MD [facility]  04/28/2023, 9:47 AM

## 2023-04-29 LAB — GLUCOSE, CAPILLARY: Glucose-Capillary: 98 mg/dL (ref 70–99)

## 2023-04-29 NOTE — Plan of Care (Signed)
  Problem: Clinical Measurements: Goal: Will remain free from infection Outcome: Progressing   Problem: Clinical Measurements: Goal: Respiratory complications will improve Outcome: Progressing   Problem: Clinical Measurements: Goal: Ability to maintain clinical measurements within normal limits will improve Outcome: Progressing   Problem: Health Behavior/Discharge Planning: Goal: Ability to manage health-related needs will improve Outcome: Progressing

## 2023-04-29 NOTE — Progress Notes (Signed)
 PROGRESS NOTE    Natalie Brown  FMW:[id number] DOB: 06-Mar-1966 DOA: 01/22/2023 PCP: [name], NP   Brief Narrative:  58 y.o. female history significant for early onset dementia, bipolar disorder, catatonia essential hypertension sinus tachycardia B12 deficiency severe protein caloric malnutrition he was in the hospital for more than 400 days discharged on 01/22/2023 only to come back to the ED shortly after due to agitation and has been in the ED boarded for placement After 49 days admitted.While in the ED evaluated by psychiatry who made changes to her medication.  Patient currently a DNR/DNI- admitted to try to improve patient environment, circadian rhythm and provide a window which can help improve the patient psychiatric status.Palliative care has been closely involved as well as psychiatry and TOC team. Neurology team saw her on 11/25 felt that her presentation is likely related to progression of her underlying neurodegenerative process, has manifested itself as early onset dementia and probably of other psychiatric disorders as well.  Advised behavioral management supportive care. She had been difficulty placement. palliative care was involved very closely, patient changed to comfort measure 03/13/23.  Namenda  was held on 1/6.  She continues to have some mild agitation and sitter is at bedside.  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  -Hold Namenda .   Cont to keep on fall precaution, provide one-to-one if not  able then provide telemetry sitter.   Bipolar disorder Catatonic features: No longer on antipsychotic meds. On comfort measures and supportive care on Ativan  0.5 mg tid and PRNs   Hypernatremia Secondary to poor oral intake.Now comfort measures.   Severe malnutrition with significant weight loss due to dementia : Cont to feed with assistance, continue supplement   CKD stage IIIa Stable.   Primary hypertension No longer on medical therapy. Comfort measures.   Sinus tachycardia Noted. Likely related to prior dehydration. Resolved.   DVT prophylaxis:Lovenox  Code Status: DNR Family Communication: None at bedside. Disposition Plan:  Status is: Inpatient Remains inpatient appropriate because: Need for placement   Consultants:  None  Procedures:  None  Antimicrobials:  None   Subjective: Patient seen and evaluated today with some mild agitation noted since 4 AM.  Objective: Vitals:   04/28/23 0357 04/28/23 2054 04/29/23 0528 04/29/23 0559  BP: 113/70 111/68 (!) 113/90   Pulse: 65 84 (!) 108   Resp: 16 19 20    Temp: 97.8 F (36.6 C) 99.5 F (37.5 C) 98.2 F (36.8 C)   TempSrc: Oral Oral Oral   SpO2: 100% 100% 99%   Weight:    49.2 kg  Height:        Intake/Output Summary (Last 24 hours) at 04/29/2023 0637 Last data filed at 04/28/2023 1900 Gross per 24 hour  Intake 477 ml  Output --  Net 477 ml   Filed Weights   04/27/23 0500 04/27/23 0608 04/29/23 0559  Weight: 49 kg 49.1 kg 49.2 kg    Examination:  General exam: Appears mildly agitated Respiratory system: Clear to auscultation. Respiratory effort normal. Cardiovascular system: S1 & S2  heard, RRR.  Gastrointestinal system: Abdomen is soft Central nervous system: Alert and awake Extremities: No edema Skin: No significant lesions noted    Data Reviewed: I have personally reviewed following labs and imaging studies  CBC: No results for input(s): WBC, NEUTROABS, HGB, HCT, MCV, PLT in  the last 168 hours. Basic Metabolic Panel: No results for input(s): NA, K, CL, CO2, GLUCOSE, BUN, CREATININE, CALCIUM , MG, PHOS in the last 168 hours. GFR: CrCl cannot be calculated (Patient's most recent lab result is older than the maximum 21 days allowed.). Liver Function Tests: No results for input(s): AST, ALT, ALKPHOS, BILITOT, PROT, ALBUMIN  in the last 168 hours. No results for input(s): LIPASE, AMYLASE in the last 168 hours. No results for input(s): AMMONIA in the last 168 hours. Coagulation Profile: No results for input(s): INR, PROTIME in the last 168 hours. Cardiac Enzymes: No results for input(s): CKTOTAL, CKMB, CKMBINDEX, TROPONINI in the last 168 hours. BNP (last 3 results) No results for input(s): PROBNP in the last 8760 hours. HbA1C: No results for input(s): HGBA1C in the last 72 hours. CBG: No results for input(s): GLUCAP in the last 168 hours. Lipid Profile: No results for input(s): CHOL, HDL, LDLCALC, TRIG, CHOLHDL, LDLDIRECT in the last 72 hours. Thyroid Function Tests: No results for input(s): TSH, T4TOTAL, FREET4, T3FREE, THYROIDAB in the last 72 hours. Anemia Panel: No results for input(s): VITAMINB12, FOLATE, FERRITIN, TIBC, IRON, RETICCTPCT in the last 72 hours. Sepsis Labs: No results for input(s): PROCALCITON, LATICACIDVEN in the last 168 hours.  No results found for this or any previous visit (from the past 240 hours).       Radiology Studies: No results found.      Scheduled Meds:  acetaminophen   1,000 mg Oral TID   enoxaparin  (LOVENOX ) injection  30 mg Subcutaneous Q24H   feeding supplement  237 mL Oral BID BM   LORazepam   0.5 mg Oral TID     LOS: 47 days    Time spent: 35 minutes    Natalie Brown Natalie Fairly, DO [facility]  If 7PM-7AM, please contact night-coverage [email or web address] 04/29/2023, 6:37 AM

## 2023-04-30 DIAGNOSIS — I10 Essential (primary) hypertension: Secondary | ICD-10-CM

## 2023-04-30 NOTE — Plan of Care (Signed)

## 2023-04-30 NOTE — Progress Notes (Signed)
 PROGRESS NOTE    Natalie Brown  FMW:[id number] DOB: [birth year] DOA: 01/22/2023 PCP: [name], NP   Brief Narrative:  58 y.o. female history significant for early onset dementia, bipolar disorder, catatonia essential hypertension sinus tachycardia B12 deficiency severe protein caloric malnutrition he was in the hospital for more than 400 days discharged on 01/22/2023 only to come back to the ED shortly after due to agitation and has been in the ED boarded for placement After 49 days admitted.While in the ED evaluated by psychiatry who made changes to her medication.  Patient currently a DNR/DNI- admitted to try to improve patient environment, circadian rhythm and provide a window which can help improve the patient psychiatric status.Palliative care has been closely involved as well as psychiatry and TOC team. Neurology team saw her on 11/25 felt that her presentation is likely related to progression of her underlying neurodegenerative process, has manifested itself as early onset dementia and probably of other psychiatric disorders as well.  Advised behavioral management supportive care. She had been difficulty placement. palliative care was involved very closely, patient changed to comfort measure 03/13/23.  Namenda  was held on 1/6.  She continues to have some mild agitation and sitter is at bedside.  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     Bipolar disorder Catatonic features: No longer on antipsychotic meds.   On comfort measures and supportive care on Ativan  0.5 mg tid and PRNs   Hypernatremia Secondary to poor oral intake.Now comfort measures.   Severe malnutrition with significant weight loss due to dementia : Cont to feed with assistance, continue supplement   CKD stage IIIa Stable.   Primary hypertension No longer on medical therapy. Comfort measures.   Sinus tachycardia Noted. Likely related to prior dehydration. Resolved.   DVT prophylaxis:Lovenox  Code Status: DNR Family Communication: None at bedside. Disposition Plan:  Status is: Inpatient Remains inpatient appropriate because: Need for placement   Consultants:  None  Procedures:  None  Antimicrobials:  None   Subjective: [name] calm.   Objective: Vitals:   04/30/23 0528 04/30/23 0552 04/30/23 0828 04/30/23 1405  BP: 109/60  102/63 97/61  Pulse: 65  80 83  Resp: 18  16 18   Temp: 99.6 F (37.6 C)  98.3 F (36.8 C) 98.6 F (37 C)  TempSrc: Oral   Oral  SpO2: 100%  100% 100%  Weight:  45.5 kg    Height:        Intake/Output Summary (Last 24 hours) at 04/30/2023 1744 Last data filed at 04/30/2023 1400 Gross per 24 hour  Intake 236 ml  Output --  Net 236 ml   Filed Weights   04/27/23 0608 04/29/23 0559 04/30/23 0552  Weight: 49.1 kg 49.2 kg 45.5 kg    Examination:  General exam: Appears calm and comfortable  Respiratory system: Clear to auscultation. Respiratory effort normal. Cardiovascular system: S1 & S2 heard, RRR.  Gastrointestinal system: Abdomen is soft.  Central nervous system: sleeping comfortably.  Extremities: no edema Psychiatry: l. Mood & affect appropriate.  Data Reviewed: I have personally reviewed following labs and imaging studies  CBC: No results for input(s): WBC, NEUTROABS, HGB, HCT, MCV, PLT in the last 168 hours. Basic Metabolic Panel: No results for input(s): NA, K, CL, CO2, GLUCOSE, BUN, CREATININE, CALCIUM , MG, PHOS in the last  168 hours. GFR: CrCl cannot be calculated (Patient's most recent lab result is older than the maximum 21 days allowed.). Liver Function Tests: No results for input(s): AST, ALT, ALKPHOS, BILITOT, PROT, ALBUMIN  in the last 168 hours. No results for input(s): LIPASE, AMYLASE in the last 168 hours. No results for input(s): AMMONIA in the last 168 hours. Coagulation Profile: No results for input(s): INR, PROTIME in the last 168 hours. Cardiac Enzymes: No results for input(s): CKTOTAL, CKMB, CKMBINDEX, TROPONINI in the last 168 hours. BNP (last 3 results) No results for input(s): PROBNP in the last 8760 hours. HbA1C: No results for input(s): HGBA1C in the last 72 hours. CBG: Recent Labs  Lab 04/29/23 0733  GLUCAP 98   Lipid Profile: No results for input(s): CHOL, HDL, LDLCALC, TRIG, CHOLHDL, LDLDIRECT in the last 72 hours. Thyroid Function Tests: No results for input(s): TSH, T4TOTAL, FREET4, T3FREE, THYROIDAB in the last 72 hours. Anemia Panel: No results for input(s): VITAMINB12, FOLATE, FERRITIN, TIBC, IRON, RETICCTPCT in the last 72 hours. Sepsis Labs: No results for input(s): PROCALCITON, LATICACIDVEN in the last 168 hours.  No results found for this or any previous visit (from the past 240 hours).       Radiology Studies: No results found.      Scheduled Meds:  acetaminophen   1,000 mg Oral TID   enoxaparin  (LOVENOX ) injection  30 mg Subcutaneous Q24H   feeding supplement  237 mL Oral BID BM   LORazepam   0.5 mg Oral TID     LOS: 48 days        Elgie Butter, DO [facility]  If 7PM-7AM, please contact night-coverage [email or web address] 04/30/2023, 5:44 PM

## 2023-05-01 NOTE — Plan of Care (Signed)

## 2023-05-01 NOTE — Progress Notes (Signed)
 PROGRESS NOTE    Natalie Brown  FMW:[id number] DOB: 03-15-1966 DOA: 01/22/2023 PCP: [name], NP   Brief Narrative:  58 y.o. female history significant for early onset dementia, bipolar disorder, catatonia essential hypertension sinus tachycardia B12 deficiency severe protein caloric malnutrition he was in the hospital for more than 400 days discharged on 01/22/2023 only to come back to the ED shortly after due to agitation and has been in the ED boarded for placement After 49 days admitted.While in the ED evaluated by psychiatry who made changes to her medication.  Patient currently a DNR/DNI- admitted to try to improve patient environment, circadian rhythm and provide a window which can help improve the patient psychiatric status.Palliative care has been closely involved as well as psychiatry and TOC team. Neurology team saw her on 11/25 felt that her presentation is likely related to progression of her underlying neurodegenerative process, has manifested itself as early onset dementia and probably of other psychiatric disorders as well.  Advised behavioral management supportive care. She had been difficulty placement. palliative care was involved very closely, patient changed to comfort measure 03/13/23.  Namenda  was held on 1/6.  She continues to have some mild agitation and sitter is at bedside.  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     Bipolar disorder Catatonic features: No longer on antipsychotic meds.   On comfort measures and supportive care on Ativan  0.5 mg tid and PRNs Not agitated.    Hypernatremia Secondary to poor oral intake. Now comfort measures.   Severe malnutrition with significant weight loss due to dementia : Cont to feed with assistance, continue supplement   CKD stage IIIa Stable.   Primary hypertension No longer on medical therapy. Comfort measures.   Sinus tachycardia Noted. Likely related to prior dehydration. Resolved.   DVT prophylaxis:Lovenox  Code Status: DNR Family Communication: None at bedside. Disposition Plan:  Status is: Inpatient Remains inpatient appropriate because: Need for placement   Consultants:  None  Procedures:  None  Antimicrobials:  None   Subjective: No events overnight.   Objective: Vitals:   04/30/23 1405 05/01/23 0439 05/01/23 0615 05/01/23 0806  BP: 97/61  99/68 (!) 124/106  Pulse: 83  77 71  Resp: 18  18 18   Temp: 98.6 F (37 C)  98.3 F (36.8 C) 98.1 F (36.7 C)  TempSrc: Oral  Oral Axillary  SpO2: 100%  100%   Weight:  50.8 kg    Height:        Intake/Output Summary (Last 24 hours) at 05/01/2023 1443 Last data filed at 05/01/2023 1054 Gross per 24 hour  Intake 354 ml  Output --  Net 354 ml   Filed Weights   04/29/23 0559 04/30/23 0552 05/01/23 0439  Weight: 49.2 kg 45.5 kg 50.8 kg    Examination:  General exam: she appears comfortable.  Respiratory system: air entry fair.  Cardiovascular system: S1 & S2 heard, RRR.  Gastrointestinal system: Abdomen is soft,  Central nervous system: calm.  Extremities: no edema.  Skin: No rashes,  Data Reviewed: I have personally reviewed following labs and imaging studies  CBC: No results for input(s): WBC, NEUTROABS, HGB, HCT, MCV, PLT in the last 168 hours. Basic Metabolic Panel: No results for input(s): NA, K, CL, CO2, GLUCOSE, BUN, CREATININE, CALCIUM , MG, PHOS in the last 168 hours. GFR: CrCl cannot be  calculated (Patient's most recent lab result is older than the maximum 21 days allowed.). Liver Function Tests: No results for input(s): AST, ALT, ALKPHOS, BILITOT, PROT, ALBUMIN  in the last 168 hours. No results for input(s): LIPASE, AMYLASE in the last 168 hours. No results for input(s): AMMONIA in the last 168 hours. Coagulation Profile: No results for input(s): INR, PROTIME in the last 168 hours. Cardiac Enzymes: No results for input(s): CKTOTAL, CKMB, CKMBINDEX, TROPONINI in the last 168 hours. BNP (last 3 results) No results for input(s): PROBNP in the last 8760 hours. HbA1C: No results for input(s): HGBA1C in the last 72 hours. CBG: Recent Labs  Lab 04/29/23 0733  GLUCAP 98   Lipid Profile: No results for input(s): CHOL, HDL, LDLCALC, TRIG, CHOLHDL, LDLDIRECT in the last 72 hours. Thyroid Function Tests: No results for input(s): TSH, T4TOTAL, FREET4, T3FREE, THYROIDAB in the last 72 hours. Anemia Panel: No results for input(s): VITAMINB12, FOLATE, FERRITIN, TIBC, IRON, RETICCTPCT in the last 72 hours. Sepsis Labs: No results for input(s): PROCALCITON, LATICACIDVEN in the last 168 hours.  No results found for this or any previous visit (from the past 240 hours).       Radiology Studies: No results found.      Scheduled Meds:  acetaminophen   1,000 mg Oral TID   enoxaparin  (LOVENOX ) injection  30 mg Subcutaneous Q24H   feeding supplement  237 mL Oral BID BM   LORazepam   0.5 mg Oral TID     LOS: 49 days        Elgie Butter, DO [facility]  If 7PM-7AM, please contact night-coverage [email or web address] 05/01/2023, 2:43 PM

## 2023-05-02 NOTE — Plan of Care (Signed)

## 2023-05-02 NOTE — Progress Notes (Signed)
 PROGRESS NOTE    Natalie Brown  FMW:[id number] DOB: 06-02-1965 DOA: 01/22/2023 PCP: [name], NP   Brief Narrative:  58 y.o. female history significant for early onset dementia, bipolar disorder, catatonia essential hypertension sinus tachycardia B12 deficiency severe protein caloric malnutrition he was in the hospital for more than 400 days discharged on 01/22/2023 only to come back to the ED shortly after due to agitation and has been in the ED boarded for placement After 49 days admitted.While in the ED evaluated by psychiatry who made changes to her medication.  Patient currently a DNR/DNI- admitted to try to improve patient environment, circadian rhythm and provide a window which can help improve the patient psychiatric status.Palliative care has been closely involved as well as psychiatry and TOC team. Neurology team saw her on 11/25 felt that her presentation is likely related to progression of her underlying neurodegenerative process, has manifested itself as early onset dementia and probably of other psychiatric disorders as well.  Advised behavioral management supportive care. She had been difficulty placement. palliative care was involved very closely, patient changed to comfort measure 03/13/23.  Namenda  was held on 1/6.  She continues to have some mild agitation intermittently and sitter is at bedside.   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     Bipolar disorder Catatonic features: No longer on  antipsychotic meds.  On comfort measures and supportive care on Ativan  0.5 mg tid and PRNs Not agitated.    Hypernatremia Secondary to poor oral intake. Now comfort measures.   Severe malnutrition with significant weight loss due to dementia : Cont to feed with assistance, continue supplement.    CKD stage IIIa Stable. No labs as she is on comfort care.    Primary hypertension No longer on medical therapy. Comfort measures.   Sinus tachycardia Noted. Likely related to prior dehydration. Resolved.   DVT prophylaxis:Lovenox  Code Status: DNR Family Communication: None at bedside. Disposition Plan:  Status is: Inpatient Remains inpatient appropriate because: Need for placement   Consultants:  None  Procedures:  None  Antimicrobials:  None   Subjective: Slightly agitated.   Objective: Vitals:   05/01/23 0615 05/01/23 0806 05/02/23 0500 05/02/23 0717  BP: 99/68 (!) 124/106  104/69  Pulse: 77 71  76  Resp: 18 18  18   Temp: 98.3 F (36.8 C) 98.1 F (36.7 C)  98.5 F (36.9 C)  TempSrc: Oral Axillary  Oral  SpO2: 100%   100%  Weight:   50 kg   Height:        Intake/Output Summary (Last 24 hours) at 05/02/2023 1852 Last data filed at 05/02/2023 1759 Gross per 24 hour  Intake 494 ml  Output 6 ml  Net 488 ml   Filed Weights   04/30/23 0552 05/01/23 0439 05/02/23 0500  Weight: 45.5 kg 50.8 kg 50 kg    Examination:  General exam: ill appearing lady, not in distress.  Respiratory system: air entry fair.  Cardiovascular system: S1 & S2 heard, Gastrointestinal system: Abdomen is  soft, Central  nervous system:  alert.  Extremities: no edema.  Skin: No rashes,       Data Reviewed: I have personally reviewed following labs and imaging studies  CBC: No results for input(s): WBC, NEUTROABS, HGB, HCT, MCV, PLT in the last 168 hours. Basic Metabolic Panel: No results for input(s): NA, K, CL, CO2, GLUCOSE, BUN, CREATININE,  CALCIUM , MG, PHOS in the last 168 hours. GFR: CrCl cannot be calculated (Patient's most recent lab result is older than the maximum 21 days allowed.). Liver Function Tests: No results for input(s): AST, ALT, ALKPHOS, BILITOT, PROT, ALBUMIN  in the last 168 hours. No results for input(s): LIPASE, AMYLASE in the last 168 hours. No results for input(s): AMMONIA in the last 168 hours. Coagulation Profile: No results for input(s): INR, PROTIME in the last 168 hours. Cardiac Enzymes: No results for input(s): CKTOTAL, CKMB, CKMBINDEX, TROPONINI in the last 168 hours. BNP (last 3 results) No results for input(s): PROBNP in the last 8760 hours. HbA1C: No results for input(s): HGBA1C in the last 72 hours. CBG: Recent Labs  Lab 04/29/23 0733  GLUCAP 98   Lipid Profile: No results for input(s): CHOL, HDL, LDLCALC, TRIG, CHOLHDL, LDLDIRECT in the last 72 hours. Thyroid Function Tests: No results for input(s): TSH, T4TOTAL, FREET4, T3FREE, THYROIDAB in the last 72 hours. Anemia Panel: No results for input(s): VITAMINB12, FOLATE, FERRITIN, TIBC, IRON, RETICCTPCT in the last 72 hours. Sepsis Labs: No results for input(s): PROCALCITON, LATICACIDVEN in the last 168 hours.  No results found for this or any previous visit (from the past 240 hours).       Radiology Studies: No results found.      Scheduled Meds:  acetaminophen   1,000 mg Oral TID   enoxaparin  (LOVENOX ) injection  30 mg Subcutaneous Q24H   feeding supplement  237 mL Oral BID BM   LORazepam   0.5 mg Oral TID     LOS: 50 days        Elgie Butter, DO [facility]  If 7PM-7AM, please contact night-coverage [email or web address] 05/02/2023, 6:52 PM

## 2023-05-02 NOTE — TOC Progression Note (Signed)
 Transition of Care [facility]) - Progression Note    Patient Details  Name: Natalie Brown MRN: [id number] Date of Birth: June 15, 1965  Transition of Care [facility]) CM/SW Contact  Rosaline JONELLE Joe, RN Phone Number: 05/02/2023, 3:14 PM  Clinical Narrative:    No bed offers available at this time.  Safety sitter remains at the bedside due to patient's high fall risk.  Patient needs locked memory care unit and no beds are available at this time.  TOC staff will continue to explore options but none available at this time due to patient's care needs and limited funding through [facility] resources.        Expected Discharge Plan and Services                                               Social Determinants of Health (SDOH) Interventions SDOH Screenings   Food Insecurity: No Food Insecurity (01/24/2023)  Housing: Low Risk  (01/24/2023)  Transportation Needs: Patient Unable To Answer (01/24/2023)  Utilities: Not At Risk (01/24/2023)  Alcohol Screen: Low Risk  (09/06/2021)  Depression (PHQ2-9): Medium Risk (05/26/2020)  Tobacco Use: High Risk (04/04/2023)    Readmission Risk Interventions     No data to display

## 2023-05-03 NOTE — Plan of Care (Signed)
  Problem: Education: Goal: Knowledge of General Education information will improve Description: Including pain rating scale, medication(s)/side effects and non-pharmacologic comfort measures Outcome: Not Progressing   Problem: Health Behavior/Discharge Planning: Goal: Ability to manage health-related needs will improve Outcome: Not Progressing   Problem: Clinical Measurements: Goal: Ability to maintain clinical measurements within normal limits will improve Outcome: Not Progressing   Problem: Nutrition: Goal: Adequate nutrition will be maintained Outcome: Not Progressing   Problem: Activity: Goal: Risk for activity intolerance will decrease Outcome: Not Progressing   Problem: Safety: Goal: Ability to remain free from injury will improve Outcome: Not Progressing   Problem: Pain Management: Goal: General experience of comfort will improve Outcome: Not Progressing

## 2023-05-03 NOTE — Progress Notes (Signed)
 PROGRESS NOTE    Natalie Brown  FMW:[id number] DOB: 06/12/1965 DOA: 01/22/2023 PCP: [name], NP   Brief Narrative:  This 58 yrs old female with PMH significant for Early onset dementia, Bipolar disorder, catatonia,  Essential hypertension,  sinus tachycardia,  B12 deficiency,  Severe protein caloric malnutrition.  She was in the hospital for more than 400 days,  discharged on 01/22/2023 only to come back to the ED shortly after due to agitation and has been in the ED boarded for placement after 49 days subsequently admitted. While in the ED evaluated by psychiatry who made changes to her medications.  Patient currently a DNR/DNI- admitted to try to improve patient environment, circadian rhythm and provide a window which can help improve the patient psychiatric status. Palliative care has been closely involved as well as psychiatry and TOC team. Neurology team saw her on 11/25 felt that her presentation is likely related to progression of her underlying neurodegenerative process, has manifested itself as early onset dementia and probably of other psychiatric disorders as well.  Advised behavioral management supportive care. She had been difficulty placement. palliative care was involved very closely, patient changed to comfort measure 03/13/23.  Namenda  was held on 1/6.  She continues to have some mild agitation intermittently and sitter is at bedside.   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  Currently on comfort measures.    Bipolar disorder: Catatonic  features: No longer on antipsychotic meds.  On comfort measures and supportive care . Continue Ativan  0.5 mg tid and PRNs Not agitated.    Hypernatremia: Secondary to poor oral intake.  Now comfort measures.   Severe malnutrition with significant weight loss due to dementia : Continue to feed with assistance, continue supplement.    CKD stage IIIa Stable. No labs as she is on comfort care.    Primary hypertension: No longer on medical therapy. Comfort measures.   Sinus tachycardia: Noted. Likely related to prior dehydration. Resolved.   DVT prophylaxis: Lovenox  Code Status: DNR Family Communication:No family at bedside. Disposition Plan:     Status is: Inpatient Remains inpatient appropriate because: Needs placement.   Consultants:  None  Procedures: None  Antimicrobials: None  Subjective: Patient was seen and examined at bedside.  Overnight events noted.   Patient is nonverbal but moves around in the bed.  [name] awake but not following commands.  Objective: Vitals:   05/01/23 0806 05/02/23 0500 05/02/23 0717 05/02/23 2028  BP: (!) 124/106  104/69 (!) 97/55  Pulse: 71  76 69  Resp: 18  18 18   Temp: 98.1 F (36.7 C)  98.5 F (36.9 C) 98.3 F (36.8 C)  TempSrc: Axillary  Oral Oral  SpO2:   100% 100%  Weight:  50 kg    Height:        Intake/Output Summary (Last 24 hours) at 05/03/2023 1050 Last data filed at 05/03/2023 0900 Gross per 24 hour  Intake 459 ml  Output 4 ml  Net 455 ml   Filed Weights   04/30/23 0552 05/01/23 0439  05/02/23 0500  Weight: 45.5 kg 50.8 kg 50 kg   Examination:  General exam: Appears calm and comfortable , not following commands. Respiratory system: Clear to auscultation. Respiratory effort normal. RR 14 Cardiovascular system: S1 & S2 heard, RRR. No JVD, murmurs, rubs, gallops or clicks.  Gastrointestinal system: Abdomen is non distended, soft and non tender.  Normal bowel sounds heard. Central nervous system: Alert ,  Arousable.  Moves all extremities in the bed Extremities: No edema, no cyanosis, no clubbing Skin: No rashes, lesions or ulcers Psychiatry: Judgement and insight appear normal. Mood & affect appropriate.    Data Reviewed: I have personally reviewed following labs and imaging studies  CBC: No results for input(s): WBC, NEUTROABS, HGB, HCT, MCV, PLT in the last 168 hours. Basic Metabolic Panel: No results for input(s): NA, K, CL, CO2, GLUCOSE, BUN, CREATININE, CALCIUM , MG, PHOS in the last 168 hours. GFR: CrCl cannot be calculated (Patient's most recent lab result is older than the maximum 21 days allowed.). Liver Function Tests: No results for input(s): AST, ALT, ALKPHOS, BILITOT, PROT, ALBUMIN  in the last 168 hours. No results for input(s): LIPASE, AMYLASE in the last 168 hours. No results for input(s): AMMONIA in the last 168 hours. Coagulation Profile: No results for input(s): INR, PROTIME in the last 168 hours. Cardiac Enzymes: No results for input(s): CKTOTAL, CKMB, CKMBINDEX, TROPONINI in the last 168 hours. BNP (last 3 results) No results for input(s): PROBNP in the last 8760 hours. HbA1C: No results for input(s): HGBA1C in the last 72 hours. CBG: Recent Labs  Lab 04/29/23 0733  GLUCAP 98   Lipid Profile: No results for input(s): CHOL, HDL, LDLCALC, TRIG, CHOLHDL, LDLDIRECT in the last 72 hours. Thyroid Function Tests: No results for input(s): TSH, T4TOTAL, FREET4, T3FREE, THYROIDAB in the last 72 hours. Anemia Panel: No results for input(s): VITAMINB12, FOLATE, FERRITIN, TIBC, IRON, RETICCTPCT in the last 72 hours. Sepsis Labs: No results for input(s): PROCALCITON, LATICACIDVEN in the last 168 hours.  No results found for this or any previous visit (from the past 240 hours).   Radiology Studies: No results found.  Scheduled Meds:  acetaminophen   1,000 mg Oral  TID   enoxaparin  (LOVENOX ) injection  30 mg Subcutaneous Q24H   feeding supplement  237 mL Oral BID BM   LORazepam   0.5 mg Oral TID   Continuous Infusions:   LOS: 51 days    Time spent: 50 mins    Darcel Dawley, MD [facility]   If 7PM-7AM, please contact night-coverage

## 2023-05-03 NOTE — Plan of Care (Signed)

## 2023-05-04 NOTE — Plan of Care (Addendum)
 Patient still asleep, due ativan  and tylenol  at 1600HR not given ; MD made aware  Problem: Education: Goal: Knowledge of General Education information will improve Description: Including pain rating scale, medication(s)/side effects and non-pharmacologic comfort measures Outcome: Progressing   Problem: Health Behavior/Discharge Planning: Goal: Ability to manage health-related needs will improve Outcome: Progressing   Problem: Clinical Measurements: Goal: Ability to maintain clinical measurements within normal limits will improve Outcome: Progressing Goal: Will remain free from infection Outcome: Progressing Goal: Diagnostic test results will improve Outcome: Progressing Goal: Respiratory complications will improve Outcome: Progressing Goal: Cardiovascular complication will be avoided Outcome: Progressing   Problem: Activity: Goal: Risk for activity intolerance will decrease Outcome: Progressing   Problem: Nutrition: Goal: Adequate nutrition will be maintained Outcome: Progressing   Problem: Coping: Goal: Level of anxiety will decrease Outcome: Progressing   Problem: Elimination: Goal: Will not experience complications related to bowel motility Outcome: Progressing Goal: Will not experience complications related to urinary retention Outcome: Progressing   Problem: Pain Management: Goal: General experience of comfort will improve Outcome: Progressing   Problem: Safety: Goal: Ability to remain free from injury will improve Outcome: Progressing   Problem: Skin Integrity: Goal: Risk for impaired skin integrity will decrease Outcome: Progressing

## 2023-05-04 NOTE — Progress Notes (Signed)
 PROGRESS NOTE    Natalie Brown  FMW:[id number] DOB: [birth year] DOA: 01/22/2023 PCP: [name], NP   Brief Narrative:  This 58 yrs old female with PMH significant for Early onset dementia, Bipolar disorder, catatonia,  Essential hypertension,  sinus tachycardia,  B12 deficiency,  Severe protein caloric malnutrition.  She was in the hospital for more than 400 days,  discharged on 01/22/2023 only to come back to the ED shortly after due to agitation and has been in the ED boarded for placement after 49 days subsequently admitted. While in the ED evaluated by psychiatry who made changes to her medications.  Patient currently a DNR/DNI- admitted to try to improve patient environment, circadian rhythm and provide a window which can help improve the patient psychiatric status. Palliative care has been closely involved as well as psychiatry and TOC team. Neurology team saw her on 11/25 felt that her presentation is likely related to progression of her underlying neurodegenerative process, has manifested itself as early onset dementia and probably of other psychiatric disorders as well.  Advised behavioral management supportive care. She had been difficulty placement. palliative care was involved very closely, patient changed to comfort measure 03/13/23.  Namenda  was held on 1/6.  She continues to have some mild agitation intermittently and sitter is at bedside.   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  Currently on comfort measures.    Bipolar disorder: Catatonic  features: No longer on antipsychotic meds.  On comfort measures and supportive care . Continue Ativan  0.5 mg tid and PRNs Not agitated.    Hypernatremia: Secondary to poor oral intake.  Now comfort measures.   Severe malnutrition with significant weight loss due to dementia : Continue to feed with assistance, continue supplement.    CKD stage IIIa Stable. No labs as she is on comfort care.    Primary hypertension: No longer on medical therapy. Comfort measures.   Sinus tachycardia: Noted. Likely related to prior dehydration. Resolved.   DVT prophylaxis: Lovenox  Code Status: DNR Family Communication:No family at bedside. Disposition Plan:     Status is: Inpatient Remains inpatient appropriate because: Needs placement.   Consultants:  None  Procedures: None  Antimicrobials: None  Subjective: Patient was seen and examined at bedside.  Overnight events noted.   Patient is nonverbal but moves around in the bed.  [name] awake but not following commands.   Objective: Vitals:   05/02/23 0500 05/02/23 0717 05/02/23 2028 05/03/23 1500  BP:  104/69 (!) 97/55 99/61  Pulse:  76 69 71  Resp:  18 18 18   Temp:  98.5 F (36.9 C) 98.3 F (36.8 C) 97.8 F (36.6 C)  TempSrc:  Oral Oral Oral  SpO2:  100% 100% 100%  Weight: 50 kg     Height:        Intake/Output Summary (Last 24 hours) at 05/04/2023 1131 Last data filed at 05/04/2023 0900 Gross per 24 hour  Intake 556 ml  Output --  Net 556 ml   Filed Weights   04/30/23 0552 05/01/23 0439 05/02/23  0500  Weight: 45.5 kg 50.8 kg 50 kg   Examination:  General exam: Appears calm and comfortable , not following commands. Respiratory system: Clear to auscultation. Respiratory effort normal. RR 14 Cardiovascular system: S1 & S2 heard, RRR. No JVD, murmurs, rubs, gallops or clicks.  Gastrointestinal system: Abdomen is non distended, soft and non tender.  Normal bowel sounds heard. Central nervous system: Alert ,  Arousable.  Moves all extremities in the bed Extremities: No edema, no cyanosis, no clubbing Skin: No rashes, lesions or ulcers Psychiatry: Judgement and insight appear normal. Mood & affect appropriate.    Data Reviewed: I have personally reviewed following labs and imaging studies  CBC: No results for input(s): WBC, NEUTROABS, HGB, HCT, MCV, PLT in the last 168 hours. Basic Metabolic Panel: No results for input(s): NA, K, CL, CO2, GLUCOSE, BUN, CREATININE, CALCIUM , MG, PHOS in the last 168 hours. GFR: CrCl cannot be calculated (Patient's most recent lab result is older than the maximum 21 days allowed.). Liver Function Tests: No results for input(s): AST, ALT, ALKPHOS, BILITOT, PROT, ALBUMIN  in the last 168 hours. No results for input(s): LIPASE, AMYLASE in the last 168 hours. No results for input(s): AMMONIA in the last 168 hours. Coagulation Profile: No results for input(s): INR, PROTIME in the last 168 hours. Cardiac Enzymes: No results for input(s): CKTOTAL, CKMB, CKMBINDEX, TROPONINI in the last 168 hours. BNP (last 3 results) No results for input(s): PROBNP in the last 8760 hours. HbA1C: No results for input(s): HGBA1C in the last 72 hours. CBG: Recent Labs  Lab 04/29/23 0733  GLUCAP 98   Lipid Profile: No results for input(s): CHOL, HDL, LDLCALC, TRIG, CHOLHDL, LDLDIRECT in the last 72 hours. Thyroid Function Tests: No results for input(s): TSH, T4TOTAL, FREET4, T3FREE, THYROIDAB in the last 72 hours. Anemia Panel: No results for input(s): VITAMINB12, FOLATE, FERRITIN, TIBC, IRON, RETICCTPCT in the last 72 hours. Sepsis Labs: No results for input(s): PROCALCITON, LATICACIDVEN in the last 168 hours.  No results found for this or any previous visit (from the past 240 hours).   Radiology Studies: No results found.  Scheduled Meds:  acetaminophen   1,000 mg Oral  TID   enoxaparin  (LOVENOX ) injection  30 mg Subcutaneous Q24H   feeding supplement  237 mL Oral BID BM   LORazepam   0.5 mg Oral TID   Continuous Infusions:   LOS: 52 days    Time spent: 35 mins    Darcel Dawley, MD [facility]   If 7PM-7AM, please contact night-coverage

## 2023-05-04 NOTE — Plan of Care (Signed)
  Problem: Education: Goal: Knowledge of General Education information will improve Description: Including pain rating scale, medication(s)/side effects and non-pharmacologic comfort measures Outcome: Progressing   Problem: Health Behavior/Discharge Planning: Goal: Ability to manage health-related needs will improve Outcome: Progressing   Problem: Activity: Goal: Risk for activity intolerance will decrease Outcome: Progressing   Problem: Coping: Goal: Level of anxiety will decrease Outcome: Progressing   Problem: Elimination: Goal: Will not experience complications related to bowel motility Outcome: Progressing Goal: Will not experience complications related to urinary retention Outcome: Progressing   Problem: Pain Management: Goal: General experience of comfort will improve Outcome: Progressing   Problem: Safety: Goal: Ability to remain free from injury will improve Outcome: Progressing   Problem: Skin Integrity: Goal: Risk for impaired skin integrity will decrease Outcome: Progressing

## 2023-05-05 MED ORDER — ZIPRASIDONE MESYLATE 20 MG IM SOLR
20.0000 mg | Freq: Once | INTRAMUSCULAR | Status: AC
  Administered 2023-05-05: 20 mg via INTRAMUSCULAR
  Filled 2023-05-05: qty 20

## 2023-05-05 NOTE — Plan of Care (Signed)

## 2023-05-05 NOTE — Plan of Care (Signed)
  Problem: Education: Goal: Knowledge of General Education information will improve Description: Including pain rating scale, medication(s)/side effects and non-pharmacologic comfort measures Outcome: Progressing   Problem: Health Behavior/Discharge Planning: Goal: Ability to manage health-related needs will improve Outcome: Progressing   Problem: Clinical Measurements: Goal: Ability to maintain clinical measurements within normal limits will improve Outcome: Progressing Goal: Will remain free from infection Outcome: Progressing Goal: Diagnostic test results will improve Outcome: Progressing Goal: Respiratory complications will improve Outcome: Progressing Goal: Cardiovascular complication will be avoided Outcome: Progressing   Problem: Clinical Measurements: Goal: Will remain free from infection Outcome: Progressing   Problem: Clinical Measurements: Goal: Diagnostic test results will improve Outcome: Progressing   Problem: Clinical Measurements: Goal: Respiratory complications will improve Outcome: Progressing   Problem: Clinical Measurements: Goal: Cardiovascular complication will be avoided Outcome: Progressing   Problem: Activity: Goal: Risk for activity intolerance will decrease Outcome: Progressing   Problem: Nutrition: Goal: Adequate nutrition will be maintained Outcome: Progressing   Problem: Coping: Goal: Level of anxiety will decrease Outcome: Progressing

## 2023-05-05 NOTE — Progress Notes (Signed)
 PROGRESS NOTE    Natalie Brown  FMW:[id number] DOB: 26-Jan-1966 DOA: 01/22/2023 PCP: [name], NP   Brief Narrative:  This 58 yrs old female with PMH significant for Early onset dementia, Bipolar disorder, catatonia,  Essential hypertension,  sinus tachycardia,  B12 deficiency,  Severe protein caloric malnutrition.  She was in the hospital for more than 400 days,  discharged on 01/22/2023 only to come back to the ED shortly after due to agitation and has been in the ED boarded for placement after 49 days subsequently admitted. While in the ED evaluated by psychiatry who made changes to her medications.  Patient currently a DNR/DNI- admitted to try to improve patient environment, circadian rhythm and provide a window which can help improve the patient psychiatric status. Palliative care has been closely involved as well as psychiatry and TOC team. Neurology team saw her on 11/25 felt that her presentation is likely related to progression of her underlying neurodegenerative process, has manifested itself as early onset dementia and probably of other psychiatric disorders as well.  Advised behavioral management supportive care. She had been difficulty placement. palliative care was involved very closely, patient changed to comfort measure 03/13/23.  Namenda  was held on 1/6.  She continues to have some mild agitation intermittently and sitter is at bedside.   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  Currently on comfort measures.    Bipolar disorder: Catatonic  features: No longer on antipsychotic meds.  On comfort measures and supportive care . Continue Ativan  0.5 mg tid and PRNs   Hypernatremia: Secondary to poor oral intake.  Now comfort measures.   Severe malnutrition with significant weight loss due to dementia : Continue to feed with assistance, continue supplement.    CKD stage IIIa Stable. No labs as she is on comfort care.    Primary hypertension: No longer on medical therapy. Comfort measures.   Sinus tachycardia: Noted. Likely related to prior dehydration.     DVT prophylaxis: Lovenox  Code Status: DNR Family Communication: No family at bedside. Disposition Plan: TBD    Status is: Inpatient Remains inpatient appropriate because: Needs placement.   Consultants:  None  Procedures: None  Antimicrobials: None  Subjective: Patient was seen and examined at bedside.  Overnight events noted.   Patient is nonverbal but moves around in the bed.  [name] awake but not following commands.   Objective: Vitals:   05/02/23 0500 05/02/23 0717 05/02/23 2028 05/03/23 1500  BP:  104/69 (!) 97/55 99/61  Pulse:  76 69 71  Resp:  18 18 18   Temp:  98.5 F (36.9 C) 98.3 F (36.8 C) 97.8 F (36.6 C)  TempSrc:  Oral Oral Oral  SpO2:  100% 100% 100%  Weight: 50 kg     Height:        Intake/Output Summary (Last 24 hours) at 05/05/2023 0848 Last data filed at 05/04/2023 1300 Gross per 24 hour  Intake 497 ml  Output --  Net 497 ml   Filed Weights   04/30/23 0552 05/01/23 0439 05/02/23 0500  Weight: 45.5 kg 50.8 kg 50 kg   Examination:  General exam: Appears calm and comfortable , not following commands. Respiratory system: Clear to auscultation. Respiratory effort normal. RR 14 Cardiovascular system: S1 & S2 heard, RRR. No JVD, murmurs, rubs, gallops or clicks.  Gastrointestinal system: Abdomen is non distended, soft and non tender.  Normal bowel sounds heard. Central nervous system: Alert , Arousable.  Moves all  extremities in the bed Extremities: No edema, no cyanosis, no clubbing Skin: No rashes, lesions or ulcers Psychiatry: Judgement and insight appear normal. Mood & affect appropriate.    Data Reviewed: I have personally reviewed following labs and imaging studies  CBC: No results for input(s): WBC, NEUTROABS, HGB, HCT, MCV, PLT in the last 168 hours. Basic Metabolic Panel: No results for input(s): NA, K, CL, CO2, GLUCOSE, BUN, CREATININE, CALCIUM , MG, PHOS in the last 168 hours. GFR: CrCl cannot be calculated (Patient's most recent lab result is older than the maximum 21 days allowed.). Liver Function Tests: No results for input(s): AST, ALT, ALKPHOS, BILITOT, PROT, ALBUMIN  in the last 168 hours. No results for input(s): LIPASE, AMYLASE in the last 168 hours. No results for input(s): AMMONIA in the last 168 hours. Coagulation Profile: No results for input(s): INR, PROTIME in the last 168 hours. Cardiac Enzymes: No results for input(s): CKTOTAL, CKMB, CKMBINDEX, TROPONINI in the last 168 hours. BNP (last 3 results) No results for input(s): PROBNP in the last 8760 hours. HbA1C: No results for input(s): HGBA1C in the last 72 hours. CBG: Recent Labs  Lab 04/29/23 0733  GLUCAP 98   Lipid Profile: No results for input(s): CHOL, HDL, LDLCALC, TRIG, CHOLHDL, LDLDIRECT in the last 72 hours. Thyroid Function Tests: No results for input(s): TSH, T4TOTAL, FREET4, T3FREE, THYROIDAB in the last 72 hours. Anemia Panel: No results for input(s): VITAMINB12, FOLATE, FERRITIN, TIBC, IRON, RETICCTPCT in the last 72 hours. Sepsis Labs: No results for input(s): PROCALCITON, LATICACIDVEN in the last 168 hours.  No results found for this or any previous visit (from the past 240 hours).   Radiology Studies: No results found.  Scheduled Meds:  acetaminophen   1,000 mg Oral TID   enoxaparin   (LOVENOX ) injection  30 mg Subcutaneous Q24H   feeding supplement  237 mL Oral BID BM   LORazepam   0.5 mg Oral TID   Continuous Infusions:   LOS: 53 days       Devaughn KATHEE Ban, MD [facility]   If 7PM-7AM, please contact night-coverage

## 2023-05-06 DIAGNOSIS — F03918 Unspecified dementia, unspecified severity, with other behavioral disturbance: Secondary | ICD-10-CM | POA: Diagnosis not present

## 2023-05-06 NOTE — Plan of Care (Signed)
  Problem: Education: Goal: Knowledge of General Education information will improve Description: Including pain rating scale, medication(s)/side effects and non-pharmacologic comfort measures Outcome: Progressing   Problem: Health Behavior/Discharge Planning: Goal: Ability to manage health-related needs will improve Outcome: Progressing   Problem: Clinical Measurements: Goal: Ability to maintain clinical measurements within normal limits will improve Outcome: Progressing Goal: Will remain free from infection Outcome: Progressing Goal: Diagnostic test results will improve Outcome: Progressing Goal: Respiratory complications will improve Outcome: Progressing Goal: Cardiovascular complication will be avoided Outcome: Progressing   Problem: Clinical Measurements: Goal: Will remain free from infection Outcome: Progressing   Problem: Clinical Measurements: Goal: Diagnostic test results will improve Outcome: Progressing   Problem: Clinical Measurements: Goal: Respiratory complications will improve Outcome: Progressing   Problem: Clinical Measurements: Goal: Cardiovascular complication will be avoided Outcome: Progressing   Problem: Activity: Goal: Risk for activity intolerance will decrease Outcome: Progressing   Problem: Coping: Goal: Level of anxiety will decrease Outcome: Progressing   Problem: Elimination: Goal: Will not experience complications related to bowel motility Outcome: Progressing Goal: Will not experience complications related to urinary retention Outcome: Progressing   Problem: Elimination: Goal: Will not experience complications related to urinary retention Outcome: Progressing

## 2023-05-06 NOTE — Progress Notes (Signed)
 PROGRESS NOTE    Natalie Brown  FMW:[id number] DOB: [birth year] DOA: 01/22/2023 PCP: [name], NP   LOS:  54 days  Brief Narrative:  This 58 yrs old female with PMH significant for Early onset dementia, Bipolar disorder, catatonia,  Essential hypertension,  sinus tachycardia,  B12 deficiency,  Severe protein caloric malnutrition.  She was in the hospital for more than 400 days,  discharged on 01/22/2023 only to come back to the ED shortly after due to agitation and has been in the ED boarded for placement after 49 days subsequently admitted. While in the ED evaluated by psychiatry who made changes to her medications.  Patient currently a DNR/DNI- admitted to try to improve patient environment, circadian rhythm and provide a window which can help improve the patient psychiatric status. Palliative care has been closely involved as well as psychiatry and TOC team. Neurology team saw her on 11/25 felt that her presentation is likely related to progression of her underlying neurodegenerative process, has manifested itself as early onset dementia and probably of other psychiatric disorders as well.  Advised behavioral management supportive care. She had been difficulty placement. palliative care was involved very closely, patient changed to comfort measure 03/13/23.  Namenda  was held on 1/6.  She continues to have some mild agitation intermittently and sitter is at bedside.   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  Currently on comfort measures.    Bipolar  disorder: Catatonic features: No longer on antipsychotic meds.  On comfort measures and supportive care . Continue Ativan  0.5 mg tid and PRNs Was on scheduled APAP that was started back in Oct/Nov for concerns for pain.  Pt completely unresponsive.  Daughter had nurse page and ask why she was still receiving this and could it be stopped.  I agree and we stopped it today.     Hypernatremia: Secondary to poor oral intake.  Now comfort measures.   Severe malnutrition with significant weight loss due to dementia : Continue to feed with assistance, continue supplement.    CKD stage IIIa Stable. No labs as she is on comfort care.    Primary hypertension: No longer on medical therapy. Comfort measures.   Sinus tachycardia: Noted. Likely related to prior dehydration.     DVT prophylaxis: Stopped lovenox  today -- Comfort Care  Code Status: DNR Family Communication: No family at bedside. Disposition Plan: TBD.  Will reach out and discuss with palliative tomorrow about discharge options and to ensure we have full comfort care measures in place (she was receiving TID APAP and Lovenox  up until today).    Status is: Inpatient Remains inpatient appropriate because: Needs placement.   Consultants:  None  Procedures: None  Antimicrobials: None  Subjective: Patient was seen and examined at bedside.  Patient is nonverbal.  Lying on her side in the fetal position.  [name] awake but not following commands.   Objective: Vitals:   05/02/23 0717 05/02/23 2028 05/03/23 1500 05/06/23 0453  BP: 104/69 (!) 97/55 99/61   Pulse: 76 69 71   Resp:  18 18 18    Temp: 98.5 F (36.9 C) 98.3 F (36.8 C) 97.8 F (36.6 C)   TempSrc: Oral Oral Oral   SpO2: 100% 100% 100%   Weight:    49.5 kg  Height:        Intake/Output Summary (Last 24 hours) at 05/06/2023 1620 Last data filed at 05/06/2023 1503 Gross per 24 hour  Intake --  Output 2 ml  Net -2 ml   Filed Weights   05/01/23 0439 05/02/23  0500 05/06/23 0453  Weight: 50.8 kg 50 kg 49.5 kg   Examination:  General exam: Appears calm and comfortable , not following commands. Respiratory system: Clear to auscultation posteriorly Central nervous system: Alert , Arousable. Lying on her side and not moving.   Extremities: No edema, no cyanosis, no clubbing Skin: No rashes, lesions or ulcers Psychiatry: Completely unresponsive.      Data Reviewed: I have personally reviewed following labs and imaging studies  CBC: No results for input(s): WBC, NEUTROABS, HGB, HCT, MCV, PLT in the last 168 hours. Basic Metabolic Panel: No results for input(s): NA, K, CL, CO2, GLUCOSE, BUN, CREATININE, CALCIUM , MG, PHOS in the last 168 hours. GFR: CrCl cannot be calculated (Patient's most recent lab result is older than the maximum 21 days allowed.). Liver Function Tests: No results for input(s): AST, ALT, ALKPHOS, BILITOT, PROT, ALBUMIN  in the last 168 hours. No results for input(s): LIPASE, AMYLASE in the last 168 hours. No results for input(s): AMMONIA in the last 168 hours. Coagulation Profile: No results for input(s): INR, PROTIME in the last 168 hours. Cardiac Enzymes: No results for input(s): CKTOTAL, CKMB, CKMBINDEX, TROPONINI in the last 168 hours. BNP (last 3 results) No results for input(s): PROBNP in the last 8760 hours. HbA1C: No results for input(s): HGBA1C in the last 72 hours. CBG: No results for input(s): GLUCAP in the last 168 hours.  Lipid Profile: No results for input(s): CHOL, HDL, LDLCALC, TRIG, CHOLHDL, LDLDIRECT in the last 72 hours. Thyroid Function Tests: No results for input(s): TSH, T4TOTAL, FREET4, T3FREE, THYROIDAB in the last 72 hours. Anemia Panel: No results for input(s): VITAMINB12, FOLATE, FERRITIN, TIBC, IRON, RETICCTPCT in the last 72 hours. Sepsis Labs: No results for input(s): PROCALCITON,  LATICACIDVEN in the last 168 hours.  No results found for this or any previous visit (from the past 240 hours).   Radiology Studies: No results found.  Scheduled Meds:  acetaminophen   1,000 mg Oral TID   enoxaparin  (LOVENOX ) injection  30 mg Subcutaneous Q24H   feeding supplement  237 mL Oral BID BM   LORazepam   0.5 mg Oral TID   Continuous Infusions:   LOS: 54 days       Reyes VEAR Gaw, MD [facility]   If 7PM-7AM, please contact night-coverage

## 2023-05-07 DIAGNOSIS — F03918 Unspecified dementia, unspecified severity, with other behavioral disturbance: Secondary | ICD-10-CM | POA: Diagnosis not present

## 2023-05-07 NOTE — Plan of Care (Signed)
  Problem: Education: °Goal: Knowledge of General Education information will improve °Description: Including pain rating scale, medication(s)/side effects and non-pharmacologic comfort measures °Outcome: Progressing °  °Problem: Health Behavior/Discharge Planning: °Goal: Ability to manage health-related needs will improve °Outcome: Progressing °  °Problem: Clinical Measurements: °Goal: Ability to maintain clinical measurements within normal limits will improve °Outcome: Progressing °Goal: Will remain free from infection °Outcome: Progressing °Goal: Diagnostic test results will improve °Outcome: Progressing °  °Problem: Clinical Measurements: °Goal: Will remain free from infection °Outcome: Progressing °  °Problem: Clinical Measurements: °Goal: Diagnostic test results will improve °Outcome: Progressing °  °

## 2023-05-07 NOTE — Progress Notes (Signed)
 PROGRESS NOTE    Natalie Brown  MVH:[id number] DOB: 07-Sep-1965 DOA: 01/22/2023 PCP: Darol Elizabeth, NP   LOS:  54 days  Brief Narrative:  This 58 yrs old female with PMH significant for Early onset dementia, Bipolar disorder, catatonia,  Essential hypertension,  sinus tachycardia,  B12 deficiency,  Severe protein caloric malnutrition.  She was in the hospital for more than 400 days,  discharged on 01/22/2023 only to come back to the ED shortly after due to agitation and has been in the ED boarded for placement after 49 days subsequently admitted. While in the ED evaluated by psychiatry who made changes to her medications.  Patient currently a DNR/DNI- admitted to try to improve patient environment, circadian rhythm and provide a window which can help improve the patient psychiatric status. Palliative care has been closely involved as well as psychiatry and TOC team. Neurology team saw her on 11/25 felt that her presentation is likely related to progression of her underlying neurodegenerative process, has manifested itself as early onset dementia and probably of other psychiatric disorders as well.  Advised behavioral management supportive care. She had been difficulty placement. palliative care was involved very closely, patient changed to comfort measure 03/13/23.  Namenda  was held on 1/6.  She continues to have some mild agitation intermittently and sitter is at bedside.   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  Currently on comfort measures. No changes from  yesterday.   Bipolar disorder: Catatonic features: No longer on antipsychotic meds.  On comfort measures and supportive care . Continue Ativan  0.5 mg tid and PRNs Was on scheduled APAP that was started back in Oct/Nov for concerns for pain.  Pt completely unresponsive.  Daughter had nurse page and ask why she was still receiving this and could it be stopped.  I agree and we stopped it today.     Hypernatremia: Secondary to poor oral intake.  Now comfort measures.   Severe malnutrition with significant weight loss due to dementia : Continue to feed with assistance, continue supplement.    CKD stage IIIa Stable. No labs as she is on comfort care.    Primary hypertension: No longer on medical therapy. Comfort measures.   Sinus tachycardia: Noted. Likely related to prior dehydration.     DVT prophylaxis: Stopped lovenox  today -- Comfort Care  Code Status: DNR Family Communication: No family at bedside. Disposition Plan: TBD.  Will reach out and discuss with palliative tomorrow about discharge options and to ensure we have full comfort care measures in place (she was receiving TID APAP and Lovenox  up until today).    Status is: Inpatient Remains inpatient appropriate because: Needs placement.   Consultants:  None  Procedures: None  Antimicrobials: None  Subjective: Patient was seen and examined at bedside.  Patient is nonverbal.  Lying on her side in the fetal position.  [name] awake but not following commands.   Objective: Vitals:   05/06/23 0453 05/06/23 1900 05/06/23 1928 05/07/23 0556  BP:  99/63 99/63 136/64  Pulse:  73  Resp:  14 14 16   Temp:  98 F (36.7 C) 98 F (36.7 C) 97.9 F (36.6 C)  TempSrc:  Axillary Axillary Axillary  SpO2:   99% 98%  Weight: 49.5 kg     Height:       No intake or output data in the 24 hours ending 05/07/23 1511  Filed Weights   05/01/23 0439 05/02/23 0500 05/06/23 0453  Weight: 50.8 kg 50 kg 49.5 kg    Examination:  General exam: Appears calm and comfortable , not following commands. Respiratory system: Clear to auscultation posteriorly Central nervous system: Alert , Arousable. Lying on her side and not moving.   Extremities: No edema, no cyanosis, no clubbing Skin: No rashes, lesions or ulcers Psychiatry: Completely unresponsive.      Data Reviewed: I have personally reviewed following labs and imaging studies  CBC: No results for input(s): "WBC", "NEUTROABS", "HGB", "HCT", "MCV", "PLT" in the last 168 hours. Basic Metabolic Panel: No results for input(s): "NA", "K", "CL", "CO2", "GLUCOSE", "BUN", "CREATININE", "CALCIUM ", "MG", "PHOS" in the last 168 hours. GFR: CrCl cannot be calculated (Patient's most recent lab result is older than the maximum 21 days allowed.). Liver Function Tests: No results for input(s): "AST", "ALT", "ALKPHOS", "BILITOT", "PROT", "ALBUMIN " in the last 168 hours. No results for input(s): "LIPASE", "AMYLASE" in the last 168 hours. No results for input(s): "AMMONIA" in the last 168 hours. Coagulation Profile: No results for input(s): "INR", "PROTIME" in the last 168 hours. Cardiac Enzymes: No results for input(s): "CKTOTAL", "CKMB", "CKMBINDEX", "TROPONINI" in the last 168 hours. BNP (last 3 results) No results for input(s): "PROBNP" in the last 8760 hours. HbA1C: No results for input(s): "HGBA1C" in the last 72 hours. CBG: No results for input(s): "GLUCAP" in the last 168 hours.  Lipid Profile: No results for input(s): "CHOL", "HDL", "LDLCALC", "TRIG", "CHOLHDL", "LDLDIRECT" in the last 72 hours. Thyroid Function Tests: No results for input(s): "TSH", "T4TOTAL", "FREET4", "T3FREE", "THYROIDAB" in the last 72 hours. Anemia Panel: No results for input(s): "VITAMINB12", "FOLATE", "FERRITIN", "TIBC", "IRON", "RETICCTPCT" in the last 72 hours. Sepsis Labs: No results for input(s): "PROCALCITON", "LATICACIDVEN" in the last 168 hours.  No results  found for this or any previous visit (from the past 240 hours).   Radiology Studies: No results found.  Scheduled Meds:  feeding supplement  237 mL Oral BID BM   LORazepam   0.5 mg Oral TID   Continuous Infusions:   LOS: 55 days       Trenton Frock, MD [facility]   If 7PM-7AM, please contact night-coverage

## 2023-05-08 NOTE — Progress Notes (Signed)
PROGRESS NOTE    Natalie Brown  WUJ:[id number] DOB: Sep 04, 1965 DOA: 01/22/2023 PCP: Lavinia Sharps, NP   LOS:  54 days  Brief Narrative:  This 58 yrs old female with PMH significant for Early onset dementia, Bipolar disorder, catatonia,  Essential hypertension,  sinus tachycardia,  B12 deficiency,  Severe protein caloric malnutrition.  She was in the hospital for more than 400 days,  discharged on 01/22/2023 only to come back to the ED shortly after due to agitation and has been in the ED boarded for placement after 49 days subsequently admitted. While in the ED evaluated by psychiatry who made changes to her medications.  Patient currently a DNR/DNI- admitted to try to improve patient environment, circadian rhythm and provide a window which can help improve the patient psychiatric status. Palliative care has been closely involved as well as psychiatry and TOC team. Neurology team saw her on 11/25 felt that her presentation is likely related to progression of her underlying neurodegenerative process, has manifested itself as early onset dementia and probably of other psychiatric disorders as well.  Advised behavioral management supportive care. She had been difficulty placement. palliative care was involved very closely, patient changed to comfort measure 03/13/23.  Namenda was held on 1/6.  She continues to have some mild agitation intermittently, sitter remains at bedside.    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  Currently on comfort measures. No  changes from yesterday. Evidently able to eat.  Sitter reports she can sit up with help and eats when she is fed.     Bipolar disorder: Catatonic features: No longer on antipsychotic meds.  On comfort measures and supportive care . Continue Ativan 0.5 mg tid and PRNs Was on scheduled APAP that was started back in Oct/Nov for concerns for pain.  Pt completely unresponsive.  Stopped this 1/15, ensure no agitation that might be signs of pain.   Hypernatremia: Secondary to poor oral intake.  Now comfort measures.   Severe malnutrition with significant weight loss due to dementia : Continue to feed with assistance, continue supplement.    CKD stage IIIa Stable. No labs as she is on comfort care.    Primary hypertension: No longer on medical therapy. Comfort measures.   Sinus tachycardia: Noted. Likely related to prior dehydration.     DVT prophylaxis: Stopped lovenox 1/15 -- Comfort Care  Code Status: DNR Family Communication: No family at bedside. Disposition Plan: TBD.  Comfort care currently    Status is: Inpatient Remains inpatient appropriate because: Needs placement.   Consultants:  None  Procedures: None  Antimicrobials: None  Subjective: Patient was seen and examined at bedside.  Patient is nonverbal.  Lying on her side in the fetal position.  Today she was lying opposite way in her bed, different than yesterday.  [name] awake but not following commands.   Objective: Vitals:   05/06/23 1900 05/06/23 1928 05/07/23 0556 05/08/23 0535  BP: 99/63 99/63 136/64   Pulse: 73     Resp: 14 14 16    Temp: 98 F (  36.7 C) 98 F (36.7 C) 97.9 F (36.6 C)   TempSrc: Axillary Axillary Axillary   SpO2:  99% 98%   Weight:    47.9 kg  Height:       No intake or output data in the 24 hours ending 05/08/23 1419  Filed Weights   05/02/23 0500 05/06/23 0453 05/08/23 0535  Weight: 50 kg 49.5 kg 47.9 kg   Examination:  General exam: Appears calm and comfortable , not  following commands. Respiratory system: Clear to auscultation posteriorly Central nervous system: Eyes open to voice but otherwise not responsive.  Does not look at speaker. Lying on her side and not moving.   Extremities: No edema, no cyanosis, no clubbing Skin: No rashes, lesions or ulcers Psychiatry: Completely unresponsive.      Data Reviewed: I have personally reviewed following labs and imaging studies  CBC: No results for input(s): "WBC", "NEUTROABS", "HGB", "HCT", "MCV", "PLT" in the last 168 hours. Basic Metabolic Panel: No results for input(s): "NA", "K", "CL", "CO2", "GLUCOSE", "BUN", "CREATININE", "CALCIUM", "MG", "PHOS" in the last 168 hours. GFR: CrCl cannot be calculated (Patient's most recent lab result is older than the maximum 21 days allowed.). Liver Function Tests: No results for input(s): "AST", "ALT", "ALKPHOS", "BILITOT", "PROT", "ALBUMIN" in the last 168 hours. No results for input(s): "LIPASE", "AMYLASE" in the last 168 hours. No results for input(s): "AMMONIA" in the last 168 hours. Coagulation Profile: No results for input(s): "INR", "PROTIME" in the last 168 hours. Cardiac Enzymes: No results for input(s): "CKTOTAL", "CKMB", "CKMBINDEX", "TROPONINI" in the last 168 hours. BNP (last 3 results) No results for input(s): "PROBNP" in the last 8760 hours. HbA1C: No results for input(s): "HGBA1C" in the last 72 hours. CBG: No results for input(s): "GLUCAP" in the last 168 hours.  Lipid Profile: No results for input(s): "CHOL", "HDL", "LDLCALC", "TRIG", "CHOLHDL", "LDLDIRECT" in the last 72 hours. Thyroid Function Tests: No results for input(s): "TSH", "T4TOTAL", "FREET4", "T3FREE", "THYROIDAB" in the last 72 hours. Anemia Panel: No results for input(s): "VITAMINB12", "FOLATE", "FERRITIN", "TIBC", "IRON", "RETICCTPCT" in the last 72 hours. Sepsis Labs: No results for input(s): "PROCALCITON", "LATICACIDVEN" in the last 168 hours.  No results found for this  or any previous visit (from the past 240 hours).   Radiology Studies: No results found.  Scheduled Meds:  feeding supplement  237 mL Oral BID BM   LORazepam  0.5 mg Oral TID   Continuous Infusions:   LOS: 56 days     Tobey Grim, MD [facility]   If 7PM-7AM, please contact night-coverage

## 2023-05-08 NOTE — Plan of Care (Addendum)
Bed locked in lowest position ; bed alarms on ; free of falls within the shift ; will continue to monitor  Problem: Education: Goal: Knowledge of General Education information will improve Description: Including pain rating scale, medication(s)/side effects and non-pharmacologic comfort measures Outcome: Progressing   Problem: Health Behavior/Discharge Planning: Goal: Ability to manage health-related needs will improve Outcome: Progressing   Problem: Clinical Measurements: Goal: Ability to maintain clinical measurements within normal limits will improve Outcome: Progressing Goal: Will remain free from infection Outcome: Progressing Goal: Diagnostic test results will improve Outcome: Progressing Goal: Respiratory complications will improve Outcome: Progressing Goal: Cardiovascular complication will be avoided Outcome: Progressing   Problem: Activity: Goal: Risk for activity intolerance will decrease Outcome: Progressing   Problem: Nutrition: Goal: Adequate nutrition will be maintained Outcome: Progressing   Problem: Coping: Goal: Level of anxiety will decrease Outcome: Progressing   Problem: Elimination: Goal: Will not experience complications related to bowel motility Outcome: Progressing Goal: Will not experience complications related to urinary retention Outcome: Progressing   Problem: Pain Management: Goal: General experience of comfort will improve Outcome: Progressing   Problem: Safety: Goal: Ability to remain free from injury will improve Outcome: Progressing   Problem: Skin Integrity: Goal: Risk for impaired skin integrity will decrease Outcome: Progressing

## 2023-05-08 NOTE — Plan of Care (Signed)

## 2023-05-09 NOTE — Plan of Care (Signed)
  Problem: Health Behavior/Discharge Planning: Goal: Ability to manage health-related needs will improve Outcome: Not Progressing   Problem: Clinical Measurements: Goal: Ability to maintain clinical measurements within normal limits will improve Outcome: Not Progressing Goal: Will remain free from infection Outcome: Not Progressing Goal: Diagnostic test results will improve Outcome: Not Progressing Goal: Respiratory complications will improve Outcome: Not Progressing Goal: Cardiovascular complication will be avoided Outcome: Not Progressing   

## 2023-05-09 NOTE — Progress Notes (Signed)
PROGRESS NOTE  GIANNE ABSHIER  ZOX:[id number] DOB: 1965-09-06 DOA: 01/22/2023 PCP: Lavinia Sharps, NP   Brief Narrative:  Natalie Brown is a 58 yrs old female with PMH significant for Early onset dementia, Bipolar disorder, catatonia,  Essential hypertension,  sinus tachycardia,  B12 deficiency,  Severe protein caloric malnutrition.  She was in the hospital for more than 400 days,  discharged on 01/22/2023 only to come back to the ED shortly after due to agitation and has been in the ED boarded for placement after 49 days subsequently admitted. While in the ED evaluated by psychiatry who made changes to her medications.  Patient currently a DNR/DNI- admitted to try to improve patient environment, circadian rhythm and provide a window which can help improve the patient psychiatric status. Palliative care has been closely involved as well as psychiatry and TOC team. Neurology team saw her on 11/25 felt that her presentation is likely related to progression of her underlying neurodegenerative process, has manifested itself as early onset dementia and probably of other psychiatric disorders as well.  Advised behavioral management supportive care. She had been difficult placement. palliative care was involved very closely, patient changed to comfort measure 03/13/23.  Namenda was held on 1/6.  She continues to have some mild agitation intermittently, sitter remains at bedside.    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  Currently on comfort measures. - continue  safety sitter   Bipolar disorder: Catatonic features: No longer on antipsychotic meds.  On comfort measures and supportive care . Continue Ativan 0.5 mg tid and PRNs Was on scheduled APAP that was started back in Oct/Nov for concerns for pain.  Pt completely unresponsive.  Stopped this 1/15, ensure no agitation that might be signs of pain.   Hypernatremia: Secondary to poor oral intake.  Now comfort measures.   Severe malnutrition with significant weight loss due to dementia : Continue to feed with assistance, continue supplement.    CKD stage IIIa Stable. No labs as she is on comfort care.    Primary hypertension: No longer on medical therapy. Comfort measures.   Sinus tachycardia: Noted. Likely related to prior dehydration.     DVT prophylaxis: Stopped lovenox 1/15 -- Comfort Care  Code Status: DNR Family Communication: No family at bedside. Disposition Plan: TBD.  Comfort care currently    Status is: Inpatient Remains inpatient appropriate because: Needs placement.   Consultants:  None  Procedures: None  Antimicrobials: None  Subjective: Patient did not respond to verbal communication. She was initially agitated and trying to get out of bed when arrived but calmed down and laid calmly while I was talking to her.   Objective: Vitals:   05/06/23 1900 05/06/23 1928 05/07/23 0556 05/08/23 0535  BP: 99/63 99/63 136/64   Pulse: 73     Resp: 14 14 16    Temp: 98 F (36.7 C) 98 F (36.7 C) 97.9 F (36.6 C)   TempSrc: Axillary Axillary Axillary   SpO2:  99% 98%   Weight:    47.9 kg  Height:  No intake or output data in the 24 hours ending 05/09/23 0748  Filed Weights   05/02/23 0500 05/06/23 0453 05/08/23 0535  Weight: 50 kg 49.5 kg 47.9 kg   Examination:  General exam: Appears calm and comfortable , not following commands. Respiratory system: Clear to auscultation posteriorly Central nervous system: Eyes open to voice but otherwise not responsive.   Does not look at speaker. Extremities: No edema, no cyanosis, no clubbing Skin: No rashes, lesions or ulcers Psychiatry: Completely unresponsive.     Data Reviewed: I have personally reviewed following labs and imaging studies  Radiology Studies: No results found.  Scheduled Meds:  feeding supplement  237 mL Oral BID BM   LORazepam  0.5 mg Oral TID   Continuous Infusions:   LOS: 57 days     Leeroy Bock, MD [facility]   If 7PM-7AM, please contact night-coverage

## 2023-05-09 NOTE — Plan of Care (Signed)

## 2023-05-10 DIAGNOSIS — F03918 Unspecified dementia, unspecified severity, with other behavioral disturbance: Secondary | ICD-10-CM | POA: Diagnosis not present

## 2023-05-10 NOTE — Progress Notes (Signed)
PROGRESS NOTE    [name]  EAV:[id number] DOB: 27-Mar-1966 DOA: 01/22/2023 PCP: Lavinia Sharps, NP     Brief Narrative:  Natalie Brown is a 58 yrs old female with PMH significant for Early onset dementia, Bipolar disorder, catatonia,  Essential hypertension,  sinus tachycardia,  B12 deficiency,  Severe protein caloric malnutrition.  She was in the hospital for more than 400 days,  discharged on 01/22/2023 only to come back to the ED shortly after due to agitation and has been in the ED boarded for placement after 49 days subsequently admitted. While in the ED evaluated by psychiatry who made changes to her medications.  Patient currently a DNR/DNI- admitted to try to improve patient environment, circadian rhythm and provide a window which can help improve the patient psychiatric status. Palliative care has been closely involved as well as psychiatry and TOC team. Neurology team saw her on 11/25 felt that her presentation is likely related to progression of her underlying neurodegenerative process, has manifested itself as early onset dementia and probably of other psychiatric disorders as well.  Advised behavioral management supportive care. She had been difficult placement. palliative care was involved very closely, patient changed to comfort measure 03/13/23.  Namenda was held on 1/6.   [name] not a candidate for Hospice given agitation and has thus be relegated to comfort care in the hospital.     New events last 24 hours / Subjective: Sleeping. Does not respond to verbal stimuli. No notable overnight events.   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  Currently on comfort measures.   Bipolar disorder: Catatonic features: No longer on antipsychotic meds.  On comfort measures and supportive care . Continue Ativan 0.5 mg tid and PRNs Was on scheduled APAP that was started back in Oct/Nov for concerns for pain.  Pt completely unresponsive.  Stopped this 1/15, ensure no agitation that might be signs of pain.   Hypernatremia: Secondary to poor oral intake.  Now comfort measures.   Severe malnutrition with significant weight loss due to dementia : Continue to feed with assistance, continue supplement.    CKD stage IIIa Stable. No labs as she is on comfort care.    Primary hypertension: No longer on medical therapy. Comfort measures.   Sinus tachycardia: Noted. Likely related to prior dehydration.     Nutrition Problem: Severe Malnutrition Etiology: chronic illness   DVT prophylaxis: N/A Code Status: DNR Family Communication: None at bedside Coming From: Home Disposition Plan: Hospice/comfort care Barriers to Discharge: Hospice unable to take  Consultants:  Psychiatry Neurology  Objective: Vitals:   05/07/23 0556 05/08/23 0535 05/10/23 0606 05/10/23 0816  BP: 136/64  110/85 113/66  Pulse:   (!) 127 76  Resp: 16   18  Temp: 97.9 F (36.6 C)   98.6 F (37 C)  TempSrc: Axillary     SpO2: 98%   100%  Weight:  47.9 kg    Height:       No intake or output data in the 24 hours ending 05/10/23 1436  Filed Weights   05/02/23 0500 05/06/23 0453 05/08/23 0535  Weight: 50 kg 49.5 kg 47.9 kg    Examination:  General exam: Sleeping Respiratory system: Clear to auscultation. No respiratory distress. Cardiovascular system: RRR. No LE edema.  Psychiatry: Pt sleeping, unable to assess  Data Reviewed: I have personally reviewed following labs and imaging studies  Scheduled Meds:  feeding supplement  237 mL Oral  BID BM   LORazepam  0.5 mg Oral TID   Continuous Infusions:   LOS: 58 days    Time spent: 25 minutes   Sharlene Dory, DO [facility] 05/10/2023, 2:36 PM   Available via Epic secure chat 7am-7pm After these hours, please refer to coverage provider listed on [email or web address]

## 2023-05-10 NOTE — Plan of Care (Signed)
  Problem: Clinical Measurements: Goal: Will remain free from infection Outcome: Progressing Goal: Diagnostic test results will improve Outcome: Progressing Goal: Respiratory complications will improve Outcome: Progressing Goal: Cardiovascular complication will be avoided Outcome: Progressing   Problem: Nutrition: Goal: Adequate nutrition will be maintained Outcome: Progressing   Problem: Coping: Goal: Level of anxiety will decrease Outcome: Progressing   Problem: Elimination: Goal: Will not experience complications related to bowel motility Outcome: Progressing Goal: Will not experience complications related to urinary retention Outcome: Progressing   Problem: Pain Management: Goal: General experience of comfort will improve Outcome: Progressing   Problem: Skin Integrity: Goal: Risk for impaired skin integrity will decrease Outcome: Progressing

## 2023-05-11 DIAGNOSIS — F03918 Unspecified dementia, unspecified severity, with other behavioral disturbance: Secondary | ICD-10-CM | POA: Diagnosis not present

## 2023-05-11 NOTE — Progress Notes (Signed)
PROGRESS NOTE    Natalie Brown  ZWC:[id number] DOB: 1966/02/04 DOA: 01/22/2023 PCP: Lavinia Sharps, NP     Brief Narrative:  Natalie Brown is a 58 yrs old female with PMH significant for Early onset dementia, Bipolar disorder, catatonia,  Essential hypertension,  sinus tachycardia,  B12 deficiency,  Severe protein caloric malnutrition.  She was in the hospital for more than 400 days,  discharged on 01/22/2023 only to come back to the ED shortly after due to agitation and has been in the ED boarded for placement after 49 days subsequently admitted. While in the ED evaluated by psychiatry who made changes to her medications.  Patient currently a DNR/DNI- admitted to try to improve patient environment, circadian rhythm and provide a window which can help improve the patient psychiatric status. Palliative care has been closely involved as well as psychiatry and TOC team. Neurology team saw her on 11/25 felt that her presentation is likely related to progression of her underlying neurodegenerative process, has manifested itself as early onset dementia and probably of other psychiatric disorders as well.  Advised behavioral management supportive care. She had been difficult placement. palliative care was involved very closely, patient changed to comfort measure 03/13/23.  Namenda was held on 1/6.   [name] not a candidate for Hospice given agitation and has thus be relegated to comfort care in the hospital.    New events last 24 hours / Subjective: No acute events overnight. Sitter bedside. [name] moving around in bed. Nonverbal.   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  Currently on comfort measures.   Bipolar disorder: Catatonic features: No longer on antipsychotic meds.  On comfort measures and supportive care . Continue Ativan 0.5 mg tid and PRNs   Hypernatremia: Secondary to poor oral intake.  Now comfort measures.   Severe malnutrition with significant weight loss due to dementia : Continue to feed with assistance, continue supplement.    CKD stage IIIa Stable. No labs as she is on comfort care.    Primary hypertension: Comfort measures.   Sinus tachycardia: Noted. Likely related to prior dehydration.       Nutrition Problem: Severe Malnutrition Etiology: chronic illness     DVT prophylaxis: N/A Code Status: DNR Family Communication: None at bedside Coming From: Home Disposition Plan: Hospice/comfort care Barriers to Discharge: Hospice unable to take   Consultants:  Psychiatry Neurology  Objective: Vitals:   05/10/23 0816 05/10/23 1800 05/10/23 1926 05/11/23 0531  BP: 113/66 (!) 90/57 104/63 103/61  Pulse: 76 69 66 95  Resp: 18 18 14 18   Temp: 98.6 F (37 C) 98.5 F (36.9 C) 97.8 F (36.6 C) 98.5 F (36.9 C)  TempSrc:  Axillary Axillary Axillary  SpO2: 100% 100% 99% 100%  Weight:      Height:       No intake or output data in the 24 hours ending 05/11/23 0811 Filed Weights   05/02/23 0500 05/06/23 0453 05/08/23 0535  Weight: 50 kg 49.5 kg 47.9 kg    Examination:  General exam: Appears agitated Respiratory system:  No respiratory distress. Psychiatry: limited judgment/insight  Data Reviewed: no recent or pertinent labs or imaging studies   Scheduled Meds:  feeding supplement  237 mL Oral BID BM   LORazepam  0.5 mg Oral TID   Continuous Infusions:   LOS: 59 days    Time spent: 25 minutes   Sharlene Dory, DO [facility] 05/11/2023, 8:11 AM   Available via Epic  secure chat 7am-7pm After these hours, please refer to coverage provider listed on [email or web address]

## 2023-05-12 NOTE — Progress Notes (Signed)
Progress Note   Patient: Natalie Brown:[id number] DOB: Jul 30, 1965 DOA: 01/22/2023     60 DOS: the patient was seen and examined on 05/12/2023   Brief hospital course: AYRIAH KOZIARA is a 58 yrs old female with PMH significant for Early onset dementia, Bipolar disorder, catatonia,  Essential hypertension,  sinus tachycardia,  B12 deficiency,  Severe protein caloric malnutrition.  She was in the hospital for more than 400 days,  discharged on 01/22/2023 only to come back to the ED shortly after due to agitation and has been in the ED boarded for placement after 49 days subsequently admitted. While in the ED evaluated by psychiatry who made changes to her medications.  Patient currently a DNR/DNI- admitted to try to improve patient environment, circadian rhythm and provide a window which can help improve the patient psychiatric status. Palliative care has been closely involved as well as psychiatry and TOC team. Neurology team saw her on 11/25 felt that her presentation is likely related to progression of her underlying neurodegenerative process, has manifested itself as early onset dementia and probably of other psychiatric disorders as well.  Advised behavioral management supportive care. She had been difficult placement. palliative care was involved very closely, patient changed to comfort measure 03/13/23.  Namenda was held on 1/6.   [name] not a candidate for Hospice given agitation and has thus be relegated to comfort care in the hospital.     Assessment and Plan: Early onset dementia: Neuro degenerative disease: Patient previously seen by Psychiatry, Neurology , PT and OT,  She has undergone extensive workup with her prolonged hospitalizations.  Hold Namenda. Anxiolytics as needed. Currently on comfort measures only.   Bipolar disorder: Catatonic features: No longer on antipsychotic meds.  On comfort measures and supportive care . Continue Ativan 0.5 mg tid and PRNs    Hypernatremia: Secondary to poor oral intake.  Now comfort measures.   Severe malnutrition with significant weight loss due to dementia : Continue to feed with assistance, continue supplement.    CKD stage IIIa Stable. No labs as she is on comfort care.    Primary hypertension: Comfort measures. No frequent vitals.   Sinus tachycardia: Improved.   Nursing supportive care. Fall, aspiration precautions.    Code Status: Limited: Do not attempt resuscitation (DNR) -DNR-LIMITED -Do Not Intubate/DNI   Subjective: Patient is seen and examined today morning. [name] lying in bed,rolls all over, sitter at bedside. She did not answer me  Physical Exam: Vitals:   05/11/23 1338 05/11/23 1913 05/11/23 2004 05/12/23 0434  BP: 98/62 108/62  98/65  Pulse: 80 (!) 58 73 91  Resp: 16 18  16   Temp: 98.3 F (36.8 C) (!) 97.3 F (36.3 C)  98.3 F (36.8 C)  TempSrc: Axillary Oral  Axillary  SpO2: 100% (!) 89% 100% 100%  Weight:      Height:        General - Middle aged [place] American female, sleeping no distress HEENT - PERRLA, EOMI, atraumatic head, non tender sinuses. Lung - Clear, basal rales, rhonchi, wheezes. Heart - S1, S2 heard, no murmurs, rubs, no pedal edema. Abdomen - Soft, non tender, bowel sounds good Neuro - sleeping, unable to do full neuro exam. Skin - Warm and dry.  Data Reviewed:      Latest Ref Rng & Units 03/26/2023   10:19 AM 03/19/2023    4:49 AM 03/14/2023    6:19 AM  CBC  WBC 4.0 - 10.5 K/uL 5.5  4.9  7.8   Hemoglobin 12.0 - 15.0 g/dL 96.2  95.2  84.1   Hematocrit 36.0 - 46.0 % 40.6  41.6  43.8   Platelets 150 - 400 K/uL 288  257  270       Latest Ref Rng & Units 03/26/2023   10:19 AM 03/25/2023    6:03 AM 03/22/2023    4:32 AM  BMP  Glucose 70 - 99 mg/dL 89  71  324   BUN 6 - 20 mg/dL 19  13  19    Creatinine 0.44 - 1.00 mg/dL 4.01  0.27  2.53   Sodium 135 - 145 mmol/L 144  145  142   Potassium 3.5 - 5.1 mmol/L 4.6  5.0  3.5   Chloride 98 -  111 mmol/L 113  109  115   CO2 22 - 32 mmol/L 24  24  25    Calcium 8.9 - 10.3 mg/dL 8.7  9.0  8.1    No results found.  Family Communication: no family at bedside  Disposition: Status is: Inpatient Remains inpatient appropriate because: challenging hospice placement due to her mental status.  Planned Discharge Destination:  barriers - behavior, difficult placement.      Time spent: 38 minutes  Author: Marcelino Duster, MD 05/12/2023 3:25 PM Secure chat 7am to 7pm For on call review [email or web address].

## 2023-05-13 DIAGNOSIS — I10 Essential (primary) hypertension: Secondary | ICD-10-CM | POA: Diagnosis not present

## 2023-05-13 DIAGNOSIS — R410 Disorientation, unspecified: Secondary | ICD-10-CM | POA: Diagnosis not present

## 2023-05-13 DIAGNOSIS — R451 Restlessness and agitation: Secondary | ICD-10-CM | POA: Diagnosis not present

## 2023-05-13 DIAGNOSIS — F03918 Unspecified dementia, unspecified severity, with other behavioral disturbance: Secondary | ICD-10-CM | POA: Diagnosis not present

## 2023-05-13 MED ORDER — LORAZEPAM 2 MG/ML IJ SOLN
1.0000 mg | Freq: Once | INTRAMUSCULAR | Status: AC
  Administered 2023-05-13: 1 mg via INTRAMUSCULAR

## 2023-05-13 MED ORDER — LORAZEPAM 2 MG/ML IJ SOLN: 0.5000 mg | Freq: Once | INTRAMUSCULAR | Status: DC

## 2023-05-13 MED ORDER — LORAZEPAM 2 MG/ML IJ SOLN
1.0000 mg | Freq: Once | INTRAMUSCULAR | Status: DC
  Filled 2023-05-13: qty 1

## 2023-05-13 NOTE — Plan of Care (Signed)
  Problem: Activity: Goal: Risk for activity intolerance will decrease Outcome: Progressing   Problem: Elimination: Goal: Will not experience complications related to bowel motility Outcome: Progressing Goal: Will not experience complications related to urinary retention Outcome: Progressing   Problem: Nutrition: Goal: Adequate nutrition will be maintained Outcome: Not Progressing   Problem: Safety: Goal: Ability to remain free from injury will improve Outcome: Not Progressing

## 2023-05-13 NOTE — Progress Notes (Signed)
Progress Note   Patient: Natalie Brown:[id number] DOB: May 28, 1965 DOA: 01/22/2023     61 DOS: the patient was seen and examined on 05/13/2023   Brief hospital course: Natalie Brown is a 58 yrs old female with PMH significant for Early onset dementia, Bipolar disorder, catatonia,  Essential hypertension,  sinus tachycardia,  B12 deficiency,  Severe protein caloric malnutrition.  She was in the hospital for more than 400 days,  discharged on 01/22/2023 only to come back to the ED shortly after due to agitation and has been in the ED boarded for placement after 49 days subsequently admitted. While in the ED evaluated by psychiatry who made changes to her medications.  Patient currently a DNR/DNI- admitted to try to improve patient environment, circadian rhythm and provide a window which can help improve the patient psychiatric status. Palliative care has been closely involved as well as psychiatry and TOC team. Neurology team saw her on 11/25 felt that her presentation is likely related to progression of her underlying neurodegenerative process, has manifested itself as early onset dementia and probably of other psychiatric disorders as well.  Advised behavioral management supportive care. She had been difficult placement. palliative care was involved very closely, patient changed to comfort measure 03/13/23.  Namenda was held on 1/6.   [name] not a candidate for Hospice given agitation and has thus be relegated to comfort care in the hospital.     Assessment and Plan: Early onset dementia: Neuro degenerative disease: Patient previously seen by Psychiatry, Neurology , PT and OT,  She has undergone extensive workup with her prolonged hospitalizations.  Hold Namenda. Anxiolytics as needed. Currently on comfort measures only.   Bipolar disorder: Catatonic features: No longer on antipsychotic meds.  On comfort measures and supportive care . Continue Ativan 0.5 mg tid and PRNs    Hypernatremia: Secondary to poor oral intake.  Now comfort measures.   Severe malnutrition with significant weight loss due to dementia : Continue to feed with assistance, continue supplement.    CKD stage IIIa Stable. No labs as she is on comfort care.    Primary hypertension: Comfort measures. No frequent vitals.   Sinus tachycardia: Improved.   Nursing supportive care. Fall, aspiration precautions.    Code Status: Limited: Do not attempt resuscitation (DNR) -DNR-LIMITED -Do Not Intubate/DNI   Subjective: Patient is seen and examined today morning. [name] lying in bed, eats poor, sitter at bedside. Does not follow commands or answer. [name] comfort care only.  Physical Exam: Vitals:   05/11/23 2004 05/12/23 0434 05/12/23 2043 05/13/23 0500  BP:  98/65 (!) 133/107   Pulse: 73 91 89   Resp:  16 16   Temp:  98.3 F (36.8 C) 98 F (36.7 C)   TempSrc:  Axillary    SpO2: 100% 100% 100%   Weight:    45 kg  Height:        General - Middle aged [place] American female, sleeping no distress HEENT - PERRLA, EOMI, atraumatic head, non tender sinuses. Lung - Clear, basal rales, rhonchi, wheezes. Heart - S1, S2 heard, no murmurs, rubs, no pedal edema. Abdomen - Soft, non tender, bowel sounds good Neuro - sleeping, unable to do full neuro exam. Skin - Warm and dry.  Data Reviewed:      Latest Ref Rng & Units 03/26/2023   10:19 AM 03/19/2023    4:49 AM 03/14/2023    6:19 AM  CBC  WBC 4.0 - 10.5 K/uL  5.5  4.9  7.8   Hemoglobin 12.0 - 15.0 g/dL 16.1  09.6  04.5   Hematocrit 36.0 - 46.0 % 40.6  41.6  43.8   Platelets 150 - 400 K/uL 288  257  270       Latest Ref Rng & Units 03/26/2023   10:19 AM 03/25/2023    6:03 AM 03/22/2023    4:32 AM  BMP  Glucose 70 - 99 mg/dL 89  71  409   BUN 6 - 20 mg/dL 19  13  19    Creatinine 0.44 - 1.00 mg/dL 8.11  9.14  7.82   Sodium 135 - 145 mmol/L 144  145  142   Potassium 3.5 - 5.1 mmol/L 4.6  5.0  3.5   Chloride 98 - 111 mmol/L  113  109  115   CO2 22 - 32 mmol/L 24  24  25    Calcium 8.9 - 10.3 mg/dL 8.7  9.0  8.1    No results found.  Family Communication: no family at bedside  Disposition: Status is: Inpatient Remains inpatient appropriate because: challenging hospice placement due to her mental status.  Planned Discharge Destination:  barriers - behavior, difficult placement.      Time spent: 36 minutes  Author: Marcelino Duster, MD 05/13/2023 7:21 PM Secure chat 7am to 7pm For on call review [email or web address].

## 2023-05-14 NOTE — Plan of Care (Signed)

## 2023-05-14 NOTE — Progress Notes (Signed)
[facility] PROGRESS NOTE  Patient: Natalie Brown KVQ:[id number]   PCP: Lavinia Sharps, NP DOB: [birth year]   DOA: 01/22/2023   DOS: 05/14/2023    Subjective: Patient is quite agitated, trying to get out of the bed during my interview.  Unable to follow any commands.  Objective:  Vitals:   05/11/23 2004 05/12/23 0434 05/12/23 2043 05/13/23 0500  BP:  98/65 (!) 133/107   Pulse: 73 91 89   Resp:  16 16   Temp:  98.3 F (36.8 C) 98 F (36.7 C)   TempSrc:  Axillary    SpO2: 100% 100% 100%   Weight:    45 kg  Height:       Unable to perform exam as patient is agitated. Spontaneously moves all extremities.  No focal deficits.  Able to swallow safely.  Assessment and plan: Dementia with neurodegenerative disease. Bipolar disorder with catatonia. Psychiatry was consulted in the past. Palliative care was also consulted. Patient was on Ativan 1 mg 3 times daily, Zyprexa 2.5 mg daily at bedtime. After presentation again with concerns for polypharmacy his medicines were adjusted. Ativan was reduced from 1 mg 3 times daily to 0.5 mg 3 times daily on 11/28 due to excessive sedation. Patient was slowly taken off of Zyprexa stopped on 11/28. Palliative care has been consulted and the patient has been on comfort care. Appears that the current medication regimen is not adequate in controlling her agitation.  Requiring bedside sitter to assist.  Will discuss with mother, palliative care as well as psychiatry with regards to further management of her agitation so that we can focus on the goal of care which is to maintain comfort.  Author: Lynden Oxford, MD [facility] 05/14/2023 5:16 PM   If 7PM-7AM, please contact night-coverage at [email or web address]

## 2023-05-14 NOTE — Plan of Care (Signed)
  Problem: Education: Goal: Knowledge of General Education information will improve Description: Including pain rating scale, medication(s)/side effects and non-pharmacologic comfort measures Outcome: Progressing   Problem: Health Behavior/Discharge Planning: Goal: Ability to manage health-related needs will improve Outcome: Progressing   Problem: Clinical Measurements: Goal: Ability to maintain clinical measurements within normal limits will improve Outcome: Progressing Goal: Will remain free from infection Outcome: Progressing   Problem: Clinical Measurements: Goal: Will remain free from infection Outcome: Progressing   

## 2023-05-15 NOTE — Plan of Care (Signed)

## 2023-05-15 NOTE — Progress Notes (Signed)
[facility] PROGRESS NOTE  Patient: Natalie Brown KVQ:[id number]   PCP: Lavinia Sharps, NP DOB: 1965/11/18   DOA: 01/22/2023   DOS: 05/15/2023    Subjective: Patient is drowsy today.  Remains agitated intermittently requiring IM injection.  Objective:  Vitals:   05/12/23 2043 05/13/23 0500 05/14/23 2100 05/15/23 0500  BP: (!) 133/107  99/68   Pulse: 89  92   Resp: 16  18   Temp: 98 F (36.7 C)  98.2 F (36.8 C)   TempSrc:   Oral   SpO2: 100%  97%   Weight:  45 kg  43.5 kg  Height:       Unable to examine the patient. Assessment and plan: Continue current comfort care measures for now.  Author: Lynden Oxford, MD [facility] 05/15/2023 6:29 PM   If 7PM-7AM, please contact night-coverage at [email or web address]

## 2023-05-15 NOTE — Progress Notes (Signed)
This morning patient was noted to be very active and fidgety. Patient has had difficulty resting last 36 hours. Patient ate well this morning and laid down, she fell asleep and this nurse held 1000 dose of ativan. Patient is noted to still be sleeping. This nurse allowed patient to rest. She has been clean and dry. This nurse will wake patient up when dinner arrives so that she can eat.

## 2023-05-15 NOTE — Plan of Care (Signed)
  Problem: Clinical Measurements: Goal: Ability to maintain clinical measurements within normal limits will improve Outcome: Progressing Goal: Will remain free from infection Outcome: Progressing Goal: Cardiovascular complication will be avoided Outcome: Progressing   Problem: Nutrition: Goal: Adequate nutrition will be maintained Outcome: Progressing   Problem: Elimination: Goal: Will not experience complications related to bowel motility Outcome: Progressing Goal: Will not experience complications related to urinary retention Outcome: Progressing   Problem: Safety: Goal: Ability to remain free from injury will improve Outcome: Progressing   Problem: Skin Integrity: Goal: Risk for impaired skin integrity will decrease Outcome: Progressing   Problem: Education: Goal: Knowledge of General Education information will improve Description: Including pain rating scale, medication(s)/side effects and non-pharmacologic comfort measures Outcome: Not Applicable   Problem: Health Behavior/Discharge Planning: Goal: Ability to manage health-related needs will improve Outcome: Not Applicable   Problem: Clinical Measurements: Goal: Diagnostic test results will improve Outcome: Not Applicable Goal: Respiratory complications will improve Outcome: Not Applicable   Problem: Pain Management: Goal: General experience of comfort will improve Outcome: Not Applicable

## 2023-05-16 NOTE — Plan of Care (Signed)
  Problem: Clinical Measurements: Goal: Ability to maintain clinical measurements within normal limits will improve Outcome: Progressing Goal: Will remain free from infection Outcome: Progressing Goal: Cardiovascular complication will be avoided Outcome: Progressing   Problem: Nutrition: Goal: Adequate nutrition will be maintained Outcome: Progressing   Problem: Coping: Goal: Level of anxiety will decrease Outcome: Progressing   Problem: Elimination: Goal: Will not experience complications related to bowel motility Outcome: Progressing Goal: Will not experience complications related to urinary retention Outcome: Progressing   Problem: Safety: Goal: Ability to remain free from injury will improve Outcome: Progressing   Problem: Skin Integrity: Goal: Risk for impaired skin integrity will decrease Outcome: Progressing

## 2023-05-16 NOTE — Progress Notes (Signed)
[facility] PROGRESS NOTE  Patient: Natalie Brown [id number]:[id number]   PCP: Lavinia Sharps, NP DOB: 12-03-1965   DOA: 01/22/2023   DOS: 05/16/2023    Subjective: Patient is alert.  Sleeping at the edge of the bed, no purposeful movement.  No interaction.  Nonverbal.  Not following any commands.  Intermittently agitated. Per The tech at bedside.  Was drowsy yesterday.  He ate well.  Objective:  Vitals:   05/14/23 2100 05/15/23 0500 05/16/23 0500 05/16/23 0927  BP: 99/68   101/72  Pulse: 92   95  Resp: 18   18  Temp: 98.2 F (36.8 C)   (!) 97.5 F (36.4 C)  TempSrc: Oral   Axillary  SpO2: 97%   100%  Weight:  43.5 kg 43.5 kg   Height:       Clear to auscultation. S1-S2 present. Nurse tech was present as a Biomedical engineer.  Assessment and plan: Bipolar disorder with catatonia. Discussed with psychiatry again. Okay to increase Ativan if indicated.  Goals of care conversation. Discussed with Perative care. Goal remains to be for full comfort. Should the patient require frequent IM injections, would consider increasing the dose of the Ativan.  Author: Lynden Oxford, MD [facility] 05/16/2023 7:23 PM   If 7PM-7AM, please contact night-coverage at [email or web address]

## 2023-05-17 NOTE — Progress Notes (Signed)
[facility] PROGRESS NOTE  Patient: Natalie Brown ZOX:[id number]   PCP: Lavinia Sharps, NP DOB: 07/31/1965   DOA: 01/22/2023   DOS: 05/17/2023    Subjective: Remains calm and comfortable.  No nausea no vomiting but no agitation.  Did not require IM injection in last 48 hours.  Objective:  Vitals:   05/16/23 0927 05/16/23 2211 05/17/23 0500 05/17/23 1019  BP: 101/72 98/67  103/76  Pulse: 95 80  (!) 109  Resp: 18   20  Temp: (!) 97.5 F (36.4 C) 97.7 F (36.5 C)  98.4 F (36.9 C)  TempSrc: Axillary Axillary  Axillary  SpO2: 100%     Weight:   43.7 kg   Height:       Sleepy. Assessment and plan: Continue comfort measures for now. Discussed with palliative care and psychiatry both, if indicated can increase the dose of the scheduled Ativan.  Author: Lynden Oxford, MD [facility] 05/17/2023 7:22 PM   If 7PM-7AM, please contact night-coverage at [email or web address]

## 2023-05-17 NOTE — Plan of Care (Signed)
  Problem: Clinical Measurements: Goal: Ability to maintain clinical measurements within normal limits will improve Outcome: Not Progressing Goal: Will remain free from infection Outcome: Not Progressing Goal: Cardiovascular complication will be avoided Outcome: Not Progressing   Problem: Activity: Goal: Risk for activity intolerance will decrease Outcome: Not Progressing   Problem: Nutrition: Goal: Adequate nutrition will be maintained Outcome: Not Progressing   Problem: Coping: Goal: Level of anxiety will decrease Outcome: Not Progressing   Problem: Elimination: Goal: Will not experience complications related to bowel motility Outcome: Not Progressing Goal: Will not experience complications related to urinary retention Outcome: Not Progressing   Problem: Safety: Goal: Ability to remain free from injury will improve Outcome: Not Progressing   Problem: Skin Integrity: Goal: Risk for impaired skin integrity will decrease Outcome: Not Progressing

## 2023-05-17 NOTE — Plan of Care (Signed)
  Problem: Clinical Measurements: Goal: Ability to maintain clinical measurements within normal limits will improve Outcome: Progressing Goal: Will remain free from infection Outcome: Progressing Goal: Cardiovascular complication will be avoided Outcome: Progressing   Problem: Activity: Goal: Risk for activity intolerance will decrease Outcome: Progressing   Problem: Nutrition: Goal: Adequate nutrition will be maintained Outcome: Progressing   Problem: Coping: Goal: Level of anxiety will decrease Outcome: Progressing   Problem: Elimination: Goal: Will not experience complications related to bowel motility Outcome: Progressing Goal: Will not experience complications related to urinary retention Outcome: Progressing   Problem: Safety: Goal: Ability to remain free from injury will improve Outcome: Progressing   Problem: Skin Integrity: Goal: Risk for impaired skin integrity will decrease Outcome: Progressing

## 2023-05-18 NOTE — Progress Notes (Signed)
[facility] PROGRESS NOTE  Patient: JANICA ELDRED ZOX:[id number]   PCP: Lavinia Sharps, NP DOB: [birth year]   DOA: 01/22/2023   DOS: 05/18/2023    Subjective: Sleeping comfortably.  Due to agitation required Ativan dose this morning.  Objective:  Vitals:   05/16/23 2211 05/17/23 0500 05/17/23 1019 05/18/23 0509  BP: 98/67  103/76 (!) 90/59  Pulse: 80  (!) 109 85  Resp:   20 17  Temp: 97.7 F (36.5 C)  98.4 F (36.9 C) (!) 96.8 F (36 C)  TempSrc: Axillary  Axillary   SpO2:    100%  Weight:  43.7 kg    Height:       Assessment and plan: Bipolar disorder with catatonia. Required 1 dose of IV Ativan.  If require further doses, will increase dose of the Ativan if indicated.  Continue comfort care.  Author: Lynden Oxford, MD [facility] 05/18/2023 4:33 PM   If 7PM-7AM, please contact night-coverage at [email or web address]

## 2023-05-18 NOTE — Plan of Care (Signed)
  Problem: Clinical Measurements: Goal: Ability to maintain clinical measurements within normal limits will improve Outcome: Not Progressing Goal: Will remain free from infection Outcome: Not Progressing Goal: Cardiovascular complication will be avoided Outcome: Not Progressing   Problem: Activity: Goal: Risk for activity intolerance will decrease Outcome: Not Progressing   Problem: Nutrition: Goal: Adequate nutrition will be maintained Outcome: Not Progressing   Problem: Coping: Goal: Level of anxiety will decrease Outcome: Not Progressing

## 2023-05-19 DIAGNOSIS — F03918 Unspecified dementia, unspecified severity, with other behavioral disturbance: Secondary | ICD-10-CM | POA: Diagnosis not present

## 2023-05-19 LAB — GLUCOSE, CAPILLARY: Glucose-Capillary: 98 mg/dL (ref 70–99)

## 2023-05-19 MED ORDER — LORAZEPAM 2 MG/ML PO CONC
0.5000 mg | Freq: Two times a day (BID) | ORAL | Status: DC
  Administered 2023-05-20 (×2): 0.5 mg via ORAL
  Filled 2023-05-19 (×2): qty 1

## 2023-05-19 MED ORDER — LORAZEPAM 2 MG/ML PO CONC
1.0000 mg | Freq: Every day | ORAL | Status: DC
Start: 2023-05-19 — End: 2023-05-20
  Administered 2023-05-19: 1 mg via ORAL
  Filled 2023-05-19: qty 1

## 2023-05-19 NOTE — Progress Notes (Signed)
[facility] PROGRESS NOTE  Patient: Natalie Brown [id number]   PCP: Lavinia Sharps, NP DOB: 26-Aug-1965   DOA: 01/22/2023   DOS: 05/19/2023    Subjective: Remains sleepy.  No nausea no vomiting.  Received IM Ativan at 6:00 in the morning.  Objective:  Vitals:   05/18/23 2200 05/18/23 2221 05/19/23 0500 05/19/23 0732  BP:  97/73    Pulse:  93  85  Resp:  18  19  Temp: (!) 97 F (36.1 C) 97.6 F (36.4 C)  98.4 F (36.9 C)  TempSrc: Axillary Oral  Axillary  SpO2:  100%  98%  Weight:   41.8 kg   Height:       No Chaperone available for examination.  Assessment and plan: Continue comfort care for now. Will increase the Ativan dose for nighttime to 1 mg since it appears that most of the IM Ativan need is in the early morning.  Author: Lynden Oxford, MD [facility] 05/19/2023 6:00 PM   If 7PM-7AM, please contact night-coverage at [email or web address]

## 2023-05-19 NOTE — Plan of Care (Signed)
Problem: Clinical Measurements: Goal: Ability to maintain clinical measurements within normal limits will improve Outcome: Progressing Goal: Will remain free from infection Outcome: Progressing

## 2023-05-19 NOTE — Plan of Care (Signed)
?  Problem: Activity: ?Goal: Risk for activity intolerance will decrease ?Outcome: Not Progressing ?  ?Problem: Coping: ?Goal: Level of anxiety will decrease ?Outcome: Not Progressing ?  ?Problem: Safety: ?Goal: Ability to remain free from injury will improve ?Outcome: Not Progressing ?  ?

## 2023-05-20 DIAGNOSIS — F03918 Unspecified dementia, unspecified severity, with other behavioral disturbance: Secondary | ICD-10-CM | POA: Diagnosis not present

## 2023-05-20 MED ORDER — LORAZEPAM 2 MG/ML PO CONC
0.5000 mg | Freq: Three times a day (TID) | ORAL | Status: DC
  Administered 2023-05-20 – 2023-10-18 (×421): 0.5 mg via ORAL
  Filled 2023-05-20 (×433): qty 1

## 2023-05-20 NOTE — Progress Notes (Signed)
Mother was updated.  Natalie Brown

## 2023-05-20 NOTE — Plan of Care (Signed)
  Problem: Clinical Measurements: Goal: Ability to maintain clinical measurements within normal limits will improve 05/20/2023 0515 by Chanette Demo, RN Outcome: Progressing 05/20/2023 0515 by Barak Bialecki, RN Outcome: Progressing Goal: Will remain free from infection 05/20/2023 0515 by Wyatte Dames, RN Outcome: Progressing 05/20/2023 0515 by Jaima Janney, RN Outcome: Progressing Goal: Cardiovascular complication will be avoided 05/20/2023 0515 by Naoma Boxell, RN Outcome: Progressing 05/20/2023 0515 by Truth Wolaver, RN Outcome: Progressing   Problem: Activity: Goal: Risk for activity intolerance will decrease 05/20/2023 0515 by Jezreel Justiniano, RN Outcome: Progressing 05/20/2023 0515 by Aizlynn Digilio, RN Outcome: Progressing   Problem: Nutrition: Goal: Adequate nutrition will be maintained 05/20/2023 0515 by Channon Brougher, RN Outcome: Progressing 05/20/2023 0515 by Malaina Mortellaro, RN Outcome: Progressing   Problem: Elimination: Goal: Will not experience complications related to bowel motility 05/20/2023 0515 by Kallon Caylor, RN Outcome: Progressing 05/20/2023 0515 by Ilo Beamon, RN Outcome: Progressing Goal: Will not experience complications related to urinary retention 05/20/2023 0515 by Angelynn Lemus, RN Outcome: Progressing 05/20/2023 0515 by Andie Mungin, RN Outcome: Progressing   Problem: Skin Integrity: Goal: Risk for impaired skin integrity will decrease 05/20/2023 0515 by Kasiyah Platter, RN Outcome: Progressing 05/20/2023 0515 by Llewelyn Sheaffer, RN Outcome: Progressing

## 2023-05-20 NOTE — Progress Notes (Addendum)
[facility] Progress Note Patient: Natalie Brown [id number] DOB: December 08, 1965 DOA: 01/22/2023  DOS: the patient was seen and examined on 05/20/2023  Brief Hospital Course: PMH for bipolar disorder with catatonia and HTN as well as early onset dementia, B12 deficiency and severe protein calorie malnutrition. Hospitalized between 9//2023 and 01/22/2023 for similar presentation. Brought back to ED on 01/22/2023 due to agitation. Referred for admission on 03/13/2023 for agitation. Neurology team saw her on 11/25 felt that her presentation is likely related to progression of her underlying neurodegenerative process, has manifested itself as early onset dementia and probably of other psychiatric disorders as well.  No further workup recommended. Palliative care was consulted. 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  Recommend to continue lorazepam.  Patient was on Namenda on 12/11, which was held on 1/6. Patient was on Zyprexa which was held by psychiatry 11/28. In December patient was found not a candidate for residential hospice and since she does not have any place for a locked memory care unit she currently remains in the hospital. Goal of care remains comfort care, verified with palliative care as well as psychiatry between the week of 1/22 - 1/28.  Safety sitter needed due to patient's high fall risk.  Assessment and plan. Early onset dementia, neurodegenerative disorder. Bipolar disorder with catatonia. Seen by neurology, psychiatry. Had prolonged hospitalization ongoing since 2023. Palliative care has also seen the patient. Goal of care remains comfort. Patient was on multiple psych medication before which were gradually tapered off at request of mother including Zyprexa, Namenda. Ativan dose was also reduced for her catatonia at request of  mother due to concerns with medication causing excessive sleepiness. Continue Ativan 0.5 mg 3 times daily. For now we will monitor.   Hypernatremia Secondary to poor oral intake. Now comfort measures. Resolved.   Severe protein calorie malnutrition Underweight. Body mass index is 15.3 kg/m.  Placing the patient at a poor risk of poor outcome. Currently comfort care.   CKD stage IIIa Stable.   Primary hypertension No longer on medical therapy. Comfort measures.   Sinus tachycardia Noted. Likely related to prior dehydration. Resolved.  Subjective: Nonverbal.  Unable to follow any commands.  Not interacting.  Alert, eyes open.  Ate breakfast and lunch.  Physical Exam: Would not interact.  Alert.  Less agitated compared to last few days.  Data Reviewed: I have Reviewed nursing notes, Vitals  Disposition: Status is: Inpatient Remains inpatient appropriate because: Awaiting placement at locked memory care unit for ongoing comfort care.   Family Communication: No one at bedside Level of care: Med-Surg   Vitals:   05/19/23 0732 05/19/23 2145 05/20/23 0459 05/20/23 0500  BP:  109/78  (!) 147/84  Pulse: 85 98  90  Resp: 19 18  18   Temp: 98.4 F (36.9 C) 98.1 F (36.7 C)  98 F (36.7 C)  TempSrc: Axillary Axillary  Axillary  SpO2: 98% 99%  99%  Weight:   43 kg   Height:         Author: Lynden Oxford, MD 05/20/2023 5:02 PM  Please look on [email or web address] to find out who is on call.

## 2023-05-20 NOTE — Progress Notes (Signed)
[facility] night cross cover note:   I was notified by RN that this patient, who is comfort care measures only, set off her bed alarm as she got out of bed. RN was at bedside within seconds of activation of the bed alarm, and noted the patient to be conscious, laying on the floor next to her bed without any obvious signs of injury, and without report of acute complaint.   [name] not on blood thinners.  There does not appear to be any clinical indication to pursue imaging at this time. Will continue fall precautions.     Newton Pigg, DO Hospitalist

## 2023-05-21 DIAGNOSIS — F03918 Unspecified dementia, unspecified severity, with other behavioral disturbance: Secondary | ICD-10-CM | POA: Diagnosis not present

## 2023-05-21 NOTE — Plan of Care (Signed)
  Problem: Clinical Measurements: Goal: Ability to maintain clinical measurements within normal limits will improve Outcome: Progressing Goal: Will remain free from infection Outcome: Progressing Goal: Cardiovascular complication will be avoided Outcome: Progressing   Problem: Activity: Goal: Risk for activity intolerance will decrease Outcome: Progressing   Problem: Nutrition: Goal: Adequate nutrition will be maintained Outcome: Progressing   Problem: Coping: Goal: Level of anxiety will decrease Outcome: Progressing   Problem: Elimination: Goal: Will not experience complications related to bowel motility Outcome: Progressing Goal: Will not experience complications related to urinary retention Outcome: Progressing

## 2023-05-21 NOTE — Progress Notes (Signed)
    [facility] Progress Note Patient: Natalie Brown [id number]:[id number] DOB: [birth year] DOA: 01/22/2023  DOS: the patient was seen and examined on 05/21/2023  Brief Hospital Course: PMH for bipolar disorder with catatonia and HTN as well as early onset dementia, B12 deficiency and severe protein calorie malnutrition. Hospitalized between 9//2023 and 01/22/2023 for similar presentation. Brought back to ED on 01/22/2023 due to agitation. Referred for admission on 03/13/2023 for agitation. Neurology team saw her on 11/25 felt that her presentation is likely related to progression of her underlying neurodegenerative process, has manifested itself as early onset dementia and probably of other psychiatric disorders as well.  No further workup recommended. Palliative care was consulted. 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  Recommend to continue lorazepam.  Patient was on Namenda on 12/11, which was held on 1/6. Patient was on Zyprexa which was held by psychiatry 11/28. In December patient was found not a candidate for residential hospice and since she does not have any place for a locked memory care unit she currently remains in the hospital. Goal of care remains comfort care, verified with palliative care as well as psychiatry between the week of 1/22 - 1/28.  Safety sitter needed due to patient's high fall risk.   Assessment and plan. Early onset dementia, neurodegenerative disorder. Bipolar disorder with catatonia. Seen by neurology, psychiatry. Had prolonged hospitalization ongoing since 2023. Palliative care has also seen the patient. Goal of care remains comfort. Patient was on multiple psych medication before which were gradually tapered off at request of mother including Zyprexa, Namenda. Ativan dose was also reduced for her catatonia at  request of mother due to concerns with medication causing excessive sleepiness. Continue Ativan 0.5 mg 3 times daily. For now we will monitor   Severe protein calorie malnutrition Underweight. Body mass index is 15.51 kg/m.  Placing the patient at a poor risk of poor outcome. Currently comfort care.   CKD stage IIIa Stable.   Primary hypertension No longer on medical therapy. Comfort measures.   Subjective:  Sleeping/resting comfortably  Physical Exam: General: NAD Cardiovascular: S1, S2 present Respiratory: CTAB Abdomen: Soft, nontender, nondistended, bowel sounds present Musculoskeletal: No bilateral pedal edema noted Skin: Normal Psychiatry: Unable to assess   Disposition: Status is: Inpatient Remains inpatient appropriate because: Awaiting placement at locked memory care unit for ongoing comfort care.   Family Communication: None at bedside Level of care: Med-Surg   Vitals:   05/19/23 2145 05/20/23 0459 05/20/23 0500 05/21/23 0621  BP: 109/78  (!) 147/84 108/69  Pulse: 98  90 90  Resp: 18  18 20   Temp: 98.1 F (36.7 C)  98 F (36.7 C) 98.2 F (36.8 C)  TempSrc: Axillary  Axillary   SpO2: 99%  99% 100%  Weight:  43 kg  43.6 kg  Height:         Author: Briant Cedar, MD 05/21/2023 1:24 PM

## 2023-05-21 NOTE — Plan of Care (Signed)
  Problem: Clinical Measurements: Goal: Ability to maintain clinical measurements within normal limits will improve Outcome: Progressing Goal: Will remain free from infection Outcome: Progressing Goal: Cardiovascular complication will be avoided Outcome: Progressing   Problem: Activity: Goal: Risk for activity intolerance will decrease Outcome: Progressing   Problem: Nutrition: Goal: Adequate nutrition will be maintained Outcome: Progressing   Problem: Coping: Goal: Level of anxiety will decrease Outcome: Progressing   Problem: Elimination: Goal: Will not experience complications related to bowel motility Outcome: Progressing Goal: Will not experience complications related to urinary retention Outcome: Progressing   Problem: Safety: Goal: Ability to remain free from injury will improve Outcome: Progressing   Problem: Skin Integrity: Goal: Risk for impaired skin integrity will decrease Outcome: Progressing

## 2023-05-22 DIAGNOSIS — F03918 Unspecified dementia, unspecified severity, with other behavioral disturbance: Secondary | ICD-10-CM | POA: Diagnosis not present

## 2023-05-22 NOTE — Plan of Care (Signed)
  Problem: Clinical Measurements: Goal: Ability to maintain clinical measurements within normal limits will improve Outcome: Progressing Goal: Will remain free from infection Outcome: Progressing Goal: Cardiovascular complication will be avoided Outcome: Progressing   Problem: Activity: Goal: Risk for activity intolerance will decrease Outcome: Progressing   Problem: Nutrition: Goal: Adequate nutrition will be maintained Outcome: Progressing   Problem: Coping: Goal: Level of anxiety will decrease Outcome: Progressing   Problem: Elimination: Goal: Will not experience complications related to bowel motility Outcome: Progressing Goal: Will not experience complications related to urinary retention Outcome: Progressing   Problem: Safety: Goal: Ability to remain free from injury will improve Outcome: Progressing   Problem: Skin Integrity: Goal: Risk for impaired skin integrity will decrease Outcome: Progressing

## 2023-05-22 NOTE — Progress Notes (Signed)
    [facility] Progress Note Patient: Natalie Brown UEA:[id number] DOB: October 13, 1965 DOA: 01/22/2023  DOS: the patient was seen and examined on 05/22/2023  Brief Hospital Course: PMH for bipolar disorder with catatonia and HTN as well as early onset dementia, B12 deficiency and severe protein calorie malnutrition. Hospitalized between 9//2023 and 01/22/2023 for similar presentation. Brought back to ED on 01/22/2023 due to agitation. Referred for admission on 03/13/2023 for agitation. Neurology team saw her on 11/25 felt that her presentation is likely related to progression of her underlying neurodegenerative process, has manifested itself as early onset dementia and probably of other psychiatric disorders as well.  No further workup recommended. Palliative care was consulted. 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  Recommend to continue lorazepam.  Patient was on Namenda on 12/11, which was held on 1/6. Patient was on Zyprexa which was held by psychiatry 11/28. In December patient was found not a candidate for residential hospice and since she does not have any place for a locked memory care unit she currently remains in the hospital. Goal of care remains comfort care, verified with palliative care as well as psychiatry between the week of 1/22   Due to high fall risk with subsequent falls, an enclosure bed has been provided for patient safety. Mother is aware and agreeable. Will monitor patient closely and change if patient is intolerant.   Assessment and plan. Early onset dementia, neurodegenerative disorder. Bipolar disorder with catatonia. Seen by neurology, psychiatry. Had prolonged hospitalization ongoing since 2023. Palliative care has also seen the patient. Goal of care remains comfort. Patient was on multiple psych medication before  which were gradually tapered off at request of mother including Zyprexa, Namenda. Ativan dose was also reduced for her catatonia at request of mother due to concerns with medication causing excessive sleepiness. Continue Ativan 0.5 mg 3 times daily. For now we will monitor   Severe protein calorie malnutrition Underweight. Body mass index is 15.59 kg/m.  Placing the patient at a poor risk of poor outcome. Currently comfort care.   CKD stage IIIa Stable.   Primary hypertension No longer on medical therapy. Comfort measures.   Subjective:  Saw pt, RN/NT at bedside, performing patient care/baths  Physical Exam: General: NAD Cardiovascular: S1, S2 present Respiratory: CTAB Abdomen: Soft, nontender, nondistended, bowel sounds present Musculoskeletal: No bilateral pedal edema noted Skin: Normal Psychiatry: Unable to assess   Disposition: Status is: Inpatient Remains inpatient appropriate because: Awaiting placement at locked memory care unit for ongoing comfort care.   Family Communication: None at bedside Level of care: Med-Surg   Vitals:   05/21/23 1510 05/21/23 2000 05/22/23 0401 05/22/23 0403  BP: 109/67 (!) 110/96 90/60   Pulse: 68 100 87   Resp: 20 17 18    Temp: 98.6 F (37 C) 97.6 F (36.4 C) 99.5 F (37.5 C)   TempSrc: Axillary Axillary    SpO2: 100% 97% (!) 85%   Weight:    43.8 kg  Height:         Author: Briant Cedar, MD 05/22/2023 3:13 PM

## 2023-05-22 NOTE — Progress Notes (Signed)
Spoke with mother about keeping the patient safe. Explained that the patient is not redirectable and is very unsafe. Explained that we are very concerned about the safety of the patient and how unsafe she is when getting out of bed. Mother is understand and agrees to the enclosure bed. Notified Dr. Sharolyn Douglas.

## 2023-05-23 DIAGNOSIS — F03918 Unspecified dementia, unspecified severity, with other behavioral disturbance: Secondary | ICD-10-CM | POA: Diagnosis not present

## 2023-05-23 NOTE — Plan of Care (Signed)
  Problem: Clinical Measurements: Goal: Will remain free from infection Outcome: Progressing Goal: Cardiovascular complication will be avoided Outcome: Progressing   Problem: Activity: Goal: Risk for activity intolerance will decrease Outcome: Progressing   Problem: Coping: Goal: Level of anxiety will decrease Outcome: Progressing   Problem: Elimination: Goal: Will not experience complications related to bowel motility Outcome: Progressing Goal: Will not experience complications related to urinary retention Outcome: Progressing   Problem: Safety: Goal: Ability to remain free from injury will improve Outcome: Progressing   Problem: Skin Integrity: Goal: Risk for impaired skin integrity will decrease Outcome: Progressing   Problem: Nutrition: Goal: Adequate nutrition will be maintained Outcome: Not Progressing

## 2023-05-23 NOTE — Progress Notes (Addendum)
    [facility] Progress Note Patient: Natalie Brown:[id number] DOB: Oct 05, 1965 DOA: 01/22/2023  DOS: the patient was seen and examined on 05/23/2023  Brief Hospital Course: PMH for bipolar disorder with catatonia and HTN as well as early onset dementia, B12 deficiency and severe protein calorie malnutrition. Hospitalized between 9//2023 and 01/22/2023 for similar presentation. Brought back to ED on 01/22/2023 due to agitation. Referred for admission on 03/13/2023 for agitation. Neurology team saw her on 11/25 felt that her presentation is likely related to progression of her underlying neurodegenerative process, has manifested itself as early onset dementia and probably of other psychiatric disorders as well.  No further workup recommended. Palliative care was consulted. 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  Recommend to continue lorazepam.  Patient was on Namenda on 12/11, which was held on 1/6. Patient was on Zyprexa which was held by psychiatry 11/28. In December patient was found not a candidate for residential hospice and since she does not have any place for a locked memory care unit she currently remains in the hospital. Goal of care remains comfort care, verified with palliative care as well as psychiatry between the week of 1/22   Due to high fall risk with subsequent falls, an enclosure bed has been provided for patient safety. Mother is aware and agreeable. Will monitor patient closely and change if patient is intolerant.   Assessment and plan. Early onset dementia, neurodegenerative disorder. Bipolar disorder with catatonia. Seen by neurology, psychiatry. Had prolonged hospitalization ongoing since 2023. Palliative care has also seen the patient. Goal of care remains comfort. Patient was on multiple psych medication before  which were gradually tapered off at request of mother including Zyprexa, Namenda. Ativan dose was also reduced for her catatonia at request of mother due to concerns with medication causing excessive sleepiness. Continue Ativan 0.5 mg 3 times daily. For now we will monitor   Severe protein calorie malnutrition Underweight. Body mass index is 15.59 kg/m.  Placing the patient at a poor risk of poor outcome. Currently comfort care.   CKD stage IIIa Stable.   Primary hypertension No longer on medical therapy. Comfort measures.   Subjective: Resting comfortably   Physical Exam: General: NAD Cardiovascular: S1, S2 present Respiratory: CTAB Abdomen: Soft, nontender, nondistended, bowel sounds present Musculoskeletal: No bilateral pedal edema noted Skin: Normal Psychiatry: Unable to assess   Disposition: Status is: Inpatient Remains inpatient appropriate because: Awaiting placement at locked memory care unit for ongoing comfort care.   Family Communication: None at bedside Level of care: Med-Surg   Vitals:   05/22/23 0403 05/22/23 2103 05/23/23 0437 05/23/23 0900  BP:  95/63 (!) 123/95 108/67  Pulse:  88 96 87  Resp:  20 20 18   Temp:  98 F (36.7 C) 98 F (36.7 C) 98 F (36.7 C)  TempSrc:      SpO2:  100% 100% 100%  Weight: 43.8 kg     Height:         Author: Briant Cedar, MD 05/23/2023 9:57 AM

## 2023-05-24 DIAGNOSIS — F03918 Unspecified dementia, unspecified severity, with other behavioral disturbance: Secondary | ICD-10-CM | POA: Diagnosis not present

## 2023-05-24 NOTE — Progress Notes (Signed)
    [facility] Progress Note Patient: Natalie Brown ATF:[id number] DOB: 08-04-1965 DOA: 01/22/2023  DOS: the patient was seen and examined on 05/24/2023  Brief Hospital Course: PMH for bipolar disorder with catatonia and HTN as well as early onset dementia, B12 deficiency and severe protein calorie malnutrition. Hospitalized between 9//2023 and 01/22/2023 for similar presentation. Brought back to ED on 01/22/2023 due to agitation. Referred for admission on 03/13/2023 for agitation. Neurology team saw her on 11/25 felt that her presentation is likely related to progression of her underlying neurodegenerative process, has manifested itself as early onset dementia and probably of other psychiatric disorders as well.  No further workup recommended. Palliative care was consulted. 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  Recommend to continue lorazepam.  Patient was on Namenda on 12/11, which was held on 1/6. Patient was on Zyprexa which was held by psychiatry 11/28. In December patient was found not a candidate for residential hospice and since she does not have any place for a locked memory care unit she currently remains in the hospital. Goal of care remains comfort care, verified with palliative care as well as psychiatry between the week of 1/22   Due to high fall risk with subsequent falls, an enclosure bed has been provided for patient safety. Mother is aware and agreeable. Will monitor patient closely and change if patient is intolerant.   Assessment and plan. Early onset dementia, neurodegenerative disorder. Bipolar disorder with catatonia. Seen by neurology, psychiatry. Had prolonged hospitalization ongoing since 2023. Palliative care has also seen the patient. Goal of care remains comfort. Patient was on multiple psych medication before  which were gradually tapered off at request of mother including Zyprexa, Namenda. Ativan dose was also reduced for her catatonia at request of mother due to concerns with medication causing excessive sleepiness. Continue Ativan 0.5 mg 3 times daily. For now we will monitor   Severe protein calorie malnutrition Underweight. Body mass index is 15.59 kg/m.  Placing the patient at a poor risk of poor outcome. Currently comfort care.   CKD stage IIIa Stable.   Primary hypertension No longer on medical therapy. Comfort measures.   Subjective: Resting comfortably   Physical Exam: General: NAD Cardiovascular: S1, S2 present Respiratory: CTAB Abdomen: Soft, nontender, nondistended, bowel sounds present Musculoskeletal: No bilateral pedal edema noted Skin: Normal Psychiatry: Unable to assess   Disposition: Status is: Inpatient Remains inpatient appropriate because: Awaiting placement at locked memory care unit for ongoing comfort care.   Family Communication: None at bedside Level of care: Med-Surg   Vitals:   05/22/23 2103 05/23/23 0437 05/23/23 0900 05/23/23 2009  BP: 95/63 (!) 123/95 108/67 109/65  Pulse: 88 96 87 90  Resp: 20 20 18 18   Temp: 98 F (36.7 C) 98 F (36.7 C) 98 F (36.7 C) 98 F (36.7 C)  TempSrc:    Oral  SpO2: 100% 100% 100% 100%  Weight:      Height:         Author: Briant Cedar, MD 05/24/2023 3:17 PM

## 2023-05-24 NOTE — Progress Notes (Addendum)
The patient is asleep pretty much all day. We tried to ambulate her twice , but when she gets up, she won't put her feet down, keeps marching in the air.   Family is at the bedside.

## 2023-05-24 NOTE — Plan of Care (Signed)
  Problem: Clinical Measurements: Goal: Ability to maintain clinical measurements within normal limits will improve Outcome: Progressing Goal: Will remain free from infection Outcome: Progressing Goal: Cardiovascular complication will be avoided Outcome: Progressing   Problem: Activity: Goal: Risk for activity intolerance will decrease Outcome: Progressing   Problem: Nutrition: Goal: Adequate nutrition will be maintained Outcome: Progressing   Problem: Coping: Goal: Level of anxiety will decrease Outcome: Progressing   Problem: Elimination: Goal: Will not experience complications related to bowel motility Outcome: Progressing Goal: Will not experience complications related to urinary retention Outcome: Progressing   Problem: Safety: Goal: Ability to remain free from injury will improve Outcome: Progressing   Problem: Skin Integrity: Goal: Risk for impaired skin integrity will decrease Outcome: Progressing

## 2023-05-25 DIAGNOSIS — F03918 Unspecified dementia, unspecified severity, with other behavioral disturbance: Secondary | ICD-10-CM | POA: Diagnosis not present

## 2023-05-25 NOTE — Plan of Care (Signed)
  Problem: Clinical Measurements: Goal: Ability to maintain clinical measurements within normal limits will improve Outcome: Progressing Goal: Will remain free from infection Outcome: Progressing Goal: Cardiovascular complication will be avoided Outcome: Progressing   Problem: Activity: Goal: Risk for activity intolerance will decrease Outcome: Progressing   Problem: Nutrition: Goal: Adequate nutrition will be maintained Outcome: Progressing   Problem: Coping: Goal: Level of anxiety will decrease Outcome: Progressing   Problem: Elimination: Goal: Will not experience complications related to bowel motility Outcome: Progressing Goal: Will not experience complications related to urinary retention Outcome: Progressing   Problem: Safety: Goal: Ability to remain free from injury will improve Outcome: Progressing   Problem: Skin Integrity: Goal: Risk for impaired skin integrity will decrease Outcome: Progressing

## 2023-05-25 NOTE — Plan of Care (Signed)
  Problem: Clinical Measurements: Goal: Ability to maintain clinical measurements within normal limits will improve Outcome: Not Progressing Goal: Will remain free from infection Outcome: Not Progressing Goal: Cardiovascular complication will be avoided Outcome: Not Progressing   Problem: Activity: Goal: Risk for activity intolerance will decrease Outcome: Not Progressing   Problem: Nutrition: Goal: Adequate nutrition will be maintained Outcome: Not Progressing   Problem: Coping: Goal: Level of anxiety will decrease Outcome: Not Progressing   Problem: Elimination: Goal: Will not experience complications related to bowel motility Outcome: Not Progressing Goal: Will not experience complications related to urinary retention Outcome: Not Progressing   Problem: Safety: Goal: Ability to remain free from injury will improve Outcome: Not Progressing   Problem: Skin Integrity: Goal: Risk for impaired skin integrity will decrease Outcome: Not Progressing

## 2023-05-25 NOTE — Progress Notes (Signed)
    [facility] Progress Note Patient: Natalie Brown ZOX:[id number] DOB: 1965/06/10 DOA: 01/22/2023  DOS: the patient was seen and examined on 05/25/2023  Brief Hospital Course: PMH for bipolar disorder with catatonia and HTN as well as early onset dementia, B12 deficiency and severe protein calorie malnutrition. Hospitalized between 9//2023 and 01/22/2023 for similar presentation. Brought back to ED on 01/22/2023 due to agitation. Referred for admission on 03/13/2023 for agitation. Neurology team saw her on 11/25 felt that her presentation is likely related to progression of her underlying neurodegenerative process, has manifested itself as early onset dementia and probably of other psychiatric disorders as well.  No further workup recommended. Palliative care was consulted. 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  Recommend to continue lorazepam.  Patient was on Namenda on 12/11, which was held on 1/6. Patient was on Zyprexa which was held by psychiatry 11/28. In December patient was found not a candidate for residential hospice and since she does not have any place for a locked memory care unit she currently remains in the hospital. Goal of care remains comfort care, verified with palliative care as well as psychiatry between the week of 1/22   Due to high fall risk with subsequent falls, an enclosure bed has been provided for patient safety. Mother is aware and agreeable. Will monitor patient closely and change if patient is intolerant.   Assessment and plan. Early onset dementia, neurodegenerative disorder. Bipolar disorder with catatonia. Seen by neurology, psychiatry. Had prolonged hospitalization ongoing since 2023. Palliative care has also seen the patient. Goal of care remains comfort. Patient was on multiple psych medication before  which were gradually tapered off at request of mother including Zyprexa, Namenda. Ativan dose was also reduced for her catatonia at request of mother due to concerns with medication causing excessive sleepiness. Continue Ativan 0.5 mg 3 times daily. For now we will monitor   Severe protein calorie malnutrition Underweight. Body mass index is 15.17 kg/m.  Placing the patient at a poor risk of poor outcome. Currently comfort care.   CKD stage IIIa Stable.   Primary hypertension No longer on medical therapy. Comfort measures.   Subjective: Resting comfortably   Physical Exam: General: NAD Cardiovascular: S1, S2 present Respiratory: CTAB Abdomen: Soft, nontender, nondistended, bowel sounds present Musculoskeletal: No bilateral pedal edema noted Skin: Normal Psychiatry: Unable to assess   Disposition: Status is: Inpatient Remains inpatient appropriate because: Awaiting placement at locked memory care unit for ongoing comfort care.   Family Communication: None at bedside Level of care: Med-Surg   Vitals:   05/24/23 1500 05/24/23 1934 05/25/23 0500 05/25/23 1604  BP: 108/69 (!) 114/91 111/73 95/72  Pulse:  99 87   Resp: 18 17 18 18   Temp: 98.2 F (36.8 C) 98.3 F (36.8 C) 98.3 F (36.8 C) 98.5 F (36.9 C)  TempSrc: Oral Oral Oral   SpO2:  97% 99%   Weight:   42.6 kg   Height:         Author: Briant Cedar, MD 05/25/2023 5:40 PM

## 2023-05-25 NOTE — Progress Notes (Signed)
Shift summary: Pt remains in veil bed. Pt slept for the majority of the shift. Restless at times but able to calm self. Safety measures followed, offered food/fluids but pt was not awake enough for intake. Skin WDL. Will continue to monitor.

## 2023-05-26 DIAGNOSIS — F03918 Unspecified dementia, unspecified severity, with other behavioral disturbance: Secondary | ICD-10-CM | POA: Diagnosis not present

## 2023-05-26 NOTE — Progress Notes (Signed)
    [facility] Progress Note Patient: Natalie Brown WUX:[id number] DOB: 11/01/1965 DOA: 01/22/2023  DOS: the patient was seen and examined on 05/26/2023  Brief Hospital Course: PMH for bipolar disorder with catatonia and HTN as well as early onset dementia, B12 deficiency and severe protein calorie malnutrition. Hospitalized between 9//2023 and 01/22/2023 for similar presentation. Brought back to ED on 01/22/2023 due to agitation. Referred for admission on 03/13/2023 for agitation. Neurology team saw her on 11/25 felt that her presentation is likely related to progression of her underlying neurodegenerative process, has manifested itself as early onset dementia and probably of other psychiatric disorders as well.  No further workup recommended. Palliative care was consulted. 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  Recommend to continue lorazepam.  Patient was on Namenda on 12/11, which was held on 1/6. Patient was on Zyprexa which was held by psychiatry 11/28. In December patient was found not a candidate for residential hospice and since she does not have any place for a locked memory care unit she currently remains in the hospital. Goal of care remains comfort care, verified with palliative care as well as psychiatry between the week of 1/22   Due to high fall risk with subsequent falls, an enclosure bed has been provided for patient safety. Mother is aware and agreeable. Will monitor patient closely and change if patient is intolerant.   Assessment and plan. Early onset dementia, neurodegenerative disorder. Bipolar disorder with catatonia. Seen by neurology, psychiatry. Had prolonged hospitalization ongoing since 2023. Palliative care has also seen the patient. Goal of care remains comfort. Patient was on multiple psych medication before  which were gradually tapered off at request of mother including Zyprexa, Namenda. Ativan dose was also reduced for her catatonia at request of mother due to concerns with medication causing excessive sleepiness. Continue Ativan 0.5 mg 3 times daily. For now we will monitor   Severe protein calorie malnutrition Underweight. Body mass index is 15.17 kg/m.  Placing the patient at a poor risk of poor outcome. Currently comfort care.   CKD stage IIIa Stable.   Primary hypertension No longer on medical therapy. Comfort measures.   Subjective: Resting comfortably   Physical Exam: General: NAD Cardiovascular: S1, S2 present Respiratory: CTAB Abdomen: Soft, nontender, nondistended, bowel sounds present Musculoskeletal: No bilateral pedal edema noted Skin: Normal Psychiatry: Unable to assess   Disposition: Status is: Inpatient Remains inpatient appropriate because: Awaiting placement at locked memory care unit for ongoing comfort care.   Family Communication: None at bedside Level of care: Med-Surg   Vitals:   05/24/23 1500 05/24/23 1934 05/25/23 0500 05/25/23 1604  BP: 108/69 (!) 114/91 111/73 95/72  Pulse:  99 87   Resp: 18 17 18 18   Temp: 98.2 F (36.8 C) 98.3 F (36.8 C) 98.3 F (36.8 C) 98.5 F (36.9 C)  TempSrc: Oral Oral Oral   SpO2:  97% 99%   Weight:   42.6 kg   Height:         Author: Briant Cedar, MD 05/26/2023 10:49 AM

## 2023-05-26 NOTE — Plan of Care (Signed)
  Problem: Clinical Measurements: Goal: Ability to maintain clinical measurements within normal limits will improve Outcome: Progressing Goal: Will remain free from infection Outcome: Progressing Goal: Cardiovascular complication will be avoided Outcome: Progressing   Problem: Activity: Goal: Risk for activity intolerance will decrease Outcome: Progressing   Problem: Nutrition: Goal: Adequate nutrition will be maintained Outcome: Progressing   Problem: Coping: Goal: Level of anxiety will decrease Outcome: Progressing   Problem: Elimination: Goal: Will not experience complications related to bowel motility Outcome: Progressing Goal: Will not experience complications related to urinary retention Outcome: Progressing   Problem: Safety: Goal: Ability to remain free from injury will improve Outcome: Progressing   Problem: Skin Integrity: Goal: Risk for impaired skin integrity will decrease Outcome: Progressing

## 2023-05-27 NOTE — Progress Notes (Signed)
    [facility] Progress Note Patient: Natalie Brown FMW:[id number] DOB: Aug 26, 1965 DOA: 01/22/2023  DOS: the patient was seen and examined on 05/27/2023  Brief Hospital Course: PMH for bipolar disorder with catatonia and HTN as well as early onset dementia, B12 deficiency and severe protein calorie malnutrition. Hospitalized between 9//2023 and 01/22/2023 for similar presentation. Brought back to ED on 01/22/2023 due to agitation. Referred for admission on 03/13/2023 for agitation. Neurology team saw her on 11/25 felt that her presentation is likely related to progression of her underlying neurodegenerative process, has manifested itself as early onset dementia and probably of other psychiatric disorders as well.  No further workup recommended. Palliative care was consulted. 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  Recommend to continue lorazepam .  Patient was on Namenda  on 12/11, which was held on 1/6. Patient was on Zyprexa  which was held by psychiatry 11/28. In December patient was found not a candidate for residential hospice and since she does not have any place for a locked memory care unit she currently remains in the hospital. Goal of care remains comfort care, verified with palliative care as well as psychiatry between the week of 1/22   Due to high fall risk with subsequent falls, an enclosure bed has been provided for patient safety. Mother is aware and agreeable. Will monitor patient closely and change if patient is intolerant.   Assessment and plan. Early onset dementia, neurodegenerative disorder. Bipolar disorder with catatonia. Seen by neurology, psychiatry. Had prolonged hospitalization ongoing since 2023. Palliative care has also seen the patient. Goal of care remains comfort. Patient was on multiple psych medication before  which were gradually tapered off at request of mother including Zyprexa , Namenda . Ativan  dose was also reduced for her catatonia at request of mother due to concerns with medication causing excessive sleepiness. Continue Ativan  0.5 mg 3 times daily. For now we will monitor   Severe protein calorie malnutrition Underweight. Body mass index is 15.17 kg/m.  Placing the patient at a poor risk of poor outcome. Currently comfort care.   CKD stage IIIa Stable.   Primary hypertension No longer on medical therapy. Comfort measures.   Subjective: Resting comfortably, unchanged clinical status   Physical Exam: General: NAD Cardiovascular: S1, S2 present Respiratory: CTAB Abdomen: Soft, nontender, nondistended, bowel sounds present Musculoskeletal: No bilateral pedal edema noted Skin: Normal Psychiatry: Unable to assess   Disposition: Status is: Inpatient Remains inpatient appropriate because: Awaiting placement at locked memory care unit for ongoing comfort care.   Family Communication: None at bedside Level of care: Med-Surg   Vitals:   05/26/23 1400 05/26/23 2250 05/27/23 0549 05/27/23 0922  BP: 102/79 101/67 134/75 93/67  Pulse: 86 80 60 72  Resp: 18 18 18 18   Temp:   98 F (36.7 C)   TempSrc:   Oral   SpO2:  100% 98% 91%  Weight:      Height:         Author: Lebron JINNY Cage, MD 05/27/2023 12:03 PM

## 2023-05-27 NOTE — Plan of Care (Signed)
  Problem: Clinical Measurements: Goal: Ability to maintain clinical measurements within normal limits will improve 05/27/2023 0723 by Estelle Wynelle SAUNDERS, LPN Outcome: Progressing 05/27/2023 0706 by Estelle Wynelle SAUNDERS, LPN Outcome: Progressing Goal: Will remain free from infection 05/27/2023 0723 by Estelle Wynelle SAUNDERS, LPN Outcome: Progressing 05/27/2023 0706 by Estelle Wynelle SAUNDERS, LPN Outcome: Progressing Goal: Cardiovascular complication will be avoided 05/27/2023 0723 by Estelle Wynelle SAUNDERS, LPN Outcome: Progressing 05/27/2023 0706 by Estelle Wynelle SAUNDERS, LPN Outcome: Progressing   Problem: Activity: Goal: Risk for activity intolerance will decrease 05/27/2023 0723 by Estelle Wynelle SAUNDERS, LPN Outcome: Progressing 05/27/2023 0706 by Estelle Wynelle SAUNDERS, LPN Outcome: Progressing   Problem: Nutrition: Goal: Adequate nutrition will be maintained 05/27/2023 0723 by Estelle Wynelle SAUNDERS, LPN Outcome: Progressing 05/27/2023 0706 by Estelle Wynelle SAUNDERS, LPN Outcome: Progressing   Problem: Coping: Goal: Level of anxiety will decrease 05/27/2023 0723 by Estelle Wynelle SAUNDERS, LPN Outcome: Progressing 05/27/2023 0706 by Estelle Wynelle SAUNDERS, LPN Outcome: Progressing   Problem: Elimination: Goal: Will not experience complications related to bowel motility 05/27/2023 0723 by Estelle Wynelle SAUNDERS, LPN Outcome: Progressing 05/27/2023 0706 by Estelle Wynelle SAUNDERS, LPN Outcome: Progressing Goal: Will not experience complications related to urinary retention 05/27/2023 0723 by Estelle Wynelle SAUNDERS, LPN Outcome: Progressing 05/27/2023 0706 by Estelle Wynelle SAUNDERS, LPN Outcome: Progressing   Problem: Safety: Goal: Ability to remain free from injury will improve 05/27/2023 0723 by Estelle Wynelle SAUNDERS, LPN Outcome: Progressing 05/27/2023 0706 by Estelle Wynelle SAUNDERS, LPN Outcome: Progressing   Problem: Skin Integrity: Goal: Risk for impaired skin integrity will decrease 05/27/2023  0723 by Estelle Wynelle SAUNDERS, LPN Outcome: Progressing 05/27/2023 0706 by Estelle Wynelle SAUNDERS, LPN Outcome: Progressing

## 2023-05-27 NOTE — Plan of Care (Signed)
  Problem: Clinical Measurements: Goal: Ability to maintain clinical measurements within normal limits will improve 05/27/2023 0723 by Estelle Wynelle SAUNDERS, LPN Outcome: Progressing 05/27/2023 0723 by Estelle Wynelle SAUNDERS, LPN Outcome: Progressing 05/27/2023 0706 by Estelle Wynelle SAUNDERS, LPN Outcome: Progressing Goal: Will remain free from infection 05/27/2023 0723 by Estelle Wynelle SAUNDERS, LPN Outcome: Progressing 05/27/2023 0723 by Estelle Wynelle SAUNDERS, LPN Outcome: Progressing 05/27/2023 0706 by Estelle Wynelle SAUNDERS, LPN Outcome: Progressing Goal: Cardiovascular complication will be avoided 05/27/2023 0723 by Estelle Wynelle SAUNDERS, LPN Outcome: Progressing 05/27/2023 0723 by Estelle Wynelle SAUNDERS, LPN Outcome: Progressing 05/27/2023 0706 by Estelle Wynelle SAUNDERS, LPN Outcome: Progressing   Problem: Activity: Goal: Risk for activity intolerance will decrease 05/27/2023 0723 by Estelle Wynelle SAUNDERS, LPN Outcome: Progressing 05/27/2023 0723 by Estelle Wynelle SAUNDERS, LPN Outcome: Progressing 05/27/2023 0706 by Estelle Wynelle SAUNDERS, LPN Outcome: Progressing   Problem: Nutrition: Goal: Adequate nutrition will be maintained 05/27/2023 0723 by Estelle Wynelle SAUNDERS, LPN Outcome: Progressing 05/27/2023 0723 by Estelle Wynelle SAUNDERS, LPN Outcome: Progressing 05/27/2023 0706 by Estelle Wynelle SAUNDERS, LPN Outcome: Progressing   Problem: Coping: Goal: Level of anxiety will decrease 05/27/2023 0723 by Estelle Wynelle SAUNDERS, LPN Outcome: Progressing 05/27/2023 0723 by Estelle Wynelle SAUNDERS, LPN Outcome: Progressing 05/27/2023 0706 by Estelle Wynelle SAUNDERS, LPN Outcome: Progressing   Problem: Elimination: Goal: Will not experience complications related to bowel motility 05/27/2023 0723 by Estelle Wynelle SAUNDERS, LPN Outcome: Progressing 05/27/2023 0723 by Estelle Wynelle SAUNDERS, LPN Outcome: Progressing 05/27/2023 0706 by Estelle Wynelle SAUNDERS, LPN Outcome: Progressing Goal: Will not experience complications  related to urinary retention 05/27/2023 0723 by Estelle Wynelle SAUNDERS, LPN Outcome: Progressing 05/27/2023 0723 by Estelle Wynelle SAUNDERS, LPN Outcome: Progressing 05/27/2023 0706 by Estelle Wynelle SAUNDERS, LPN Outcome: Progressing   Problem: Safety: Goal: Ability to remain free from injury will improve 05/27/2023 0723 by Estelle Wynelle SAUNDERS, LPN Outcome: Progressing 05/27/2023 0723 by Estelle Wynelle SAUNDERS, LPN Outcome: Progressing 05/27/2023 0706 by Estelle Wynelle SAUNDERS, LPN Outcome: Progressing   Problem: Skin Integrity: Goal: Risk for impaired skin integrity will decrease 05/27/2023 0723 by Estelle Wynelle SAUNDERS, LPN Outcome: Progressing 05/27/2023 0723 by Estelle Wynelle SAUNDERS, LPN Outcome: Progressing 05/27/2023 0706 by Estelle Wynelle SAUNDERS, LPN Outcome: Progressing

## 2023-05-27 NOTE — Plan of Care (Signed)
  Problem: Clinical Measurements: Goal: Ability to maintain clinical measurements within normal limits will improve Outcome: Progressing Goal: Will remain free from infection Outcome: Progressing Goal: Cardiovascular complication will be avoided Outcome: Progressing   Problem: Activity: Goal: Risk for activity intolerance will decrease Outcome: Progressing   Problem: Nutrition: Goal: Adequate nutrition will be maintained Outcome: Progressing   Problem: Coping: Goal: Level of anxiety will decrease Outcome: Progressing   Problem: Elimination: Goal: Will not experience complications related to bowel motility Outcome: Progressing Goal: Will not experience complications related to urinary retention Outcome: Progressing   Problem: Safety: Goal: Ability to remain free from injury will improve Outcome: Progressing   Problem: Skin Integrity: Goal: Risk for impaired skin integrity will decrease Outcome: Progressing

## 2023-05-28 NOTE — Progress Notes (Signed)
    [facility] Progress Note Patient: Natalie Brown FMW:[id number] DOB: March 22, 1966 DOA: 01/22/2023  DOS: the patient was seen and examined on 05/28/2023  Brief Hospital Course: PMH for bipolar disorder with catatonia and HTN as well as early onset dementia, B12 deficiency and severe protein calorie malnutrition. Hospitalized between 9//2023 and 01/22/2023 for similar presentation. Brought back to ED on 01/22/2023 due to agitation. Referred for admission on 03/13/2023 for agitation. Neurology team saw her on 11/25 felt that her presentation is likely related to progression of her underlying neurodegenerative process, has manifested itself as early onset dementia and probably of other psychiatric disorders as well.  No further workup recommended. Palliative care was consulted. 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  Recommend to continue lorazepam .  Patient was on Namenda  on 12/11, which was held on 1/6. Patient was on Zyprexa  which was held by psychiatry 11/28. In December patient was found not a candidate for residential hospice and since she does not have any place for a locked memory care unit she currently remains in the hospital. Goal of care remains comfort care, verified with palliative care as well as psychiatry between the week of 1/22   Due to high fall risk with subsequent falls, an enclosure bed has been provided for patient safety. Mother is aware and agreeable. Will monitor patient closely and change if patient is intolerant.   Assessment and plan. Early onset dementia, neurodegenerative disorder. Bipolar disorder with catatonia. Seen by neurology, psychiatry. Had prolonged hospitalization ongoing since 2023. Palliative care has also seen the patient. Goal of care remains comfort. Patient was on multiple psych medication before  which were gradually tapered off at request of mother including Zyprexa , Namenda . Ativan  dose was also reduced for her catatonia at request of mother due to concerns with medication causing excessive sleepiness. Continue Ativan  0.5 mg 3 times daily. For now we will monitor   Severe protein calorie malnutrition Underweight. Body mass index is 15.17 kg/m.  Placing the patient at a poor risk of poor outcome. Currently comfort care.   CKD stage IIIa Stable.   Primary hypertension No longer on medical therapy. Comfort measures.   Subjective: Resting comfortably, unchanged clinical status. Noted R elbow skin tear, WOC consulted   Physical Exam: General: NAD Cardiovascular: S1, S2 present Respiratory: CTAB Abdomen: Soft, nontender, nondistended, bowel sounds present Musculoskeletal: No bilateral pedal edema noted Skin: Normal Psychiatry: Unable to assess   Disposition: Status is: Inpatient Remains inpatient appropriate because: Awaiting placement at locked memory care unit for ongoing comfort care.   Family Communication: None at bedside Level of care: Med-Surg   Vitals:   05/27/23 1500 05/27/23 2038 05/28/23 0500 05/28/23 0918  BP: 90/67 113/73  98/67  Pulse: 86 87    Resp: 16 17  20   Temp: 98.6 F (37 C) 98.3 F (36.8 C)  97.9 F (36.6 C)  TempSrc: Oral   Oral  SpO2: 100%   (!) 77%  Weight:   42.6 kg   Height:         Author: Lebron JINNY Cage, MD 05/28/2023 9:26 AM

## 2023-05-28 NOTE — Plan of Care (Signed)
  Problem: Clinical Measurements: Goal: Ability to maintain clinical measurements within normal limits will improve Outcome: Progressing Goal: Will remain free from infection Outcome: Progressing Goal: Cardiovascular complication will be avoided Outcome: Progressing   Problem: Activity: Goal: Risk for activity intolerance will decrease Outcome: Progressing   Problem: Nutrition: Goal: Adequate nutrition will be maintained Outcome: Progressing   Problem: Coping: Goal: Level of anxiety will decrease Outcome: Progressing   

## 2023-05-28 NOTE — Plan of Care (Signed)
  Problem: Activity: Goal: Risk for activity intolerance will decrease Outcome: Not Progressing   Problem: Nutrition: Goal: Adequate nutrition will be maintained Outcome: Not Progressing   

## 2023-05-28 NOTE — Consult Note (Signed)
 WOC Nurse Consult Note: Reason for Consult: Consult requested for right elbow skin tear.  Performed remotely after review of progress notes and photo in the EMR. Right elbow with full thickness skin tear, red and moist with loose flap of skin surrounding.  These types of wounds can be treated independently by the bedside nurses using the Skin care order set as follows: Apply Xeroform gauze to right elbow Q day, then cover with foam dressing.  Change foam dressing Q 3 days or PRN soiling. Please re-consult if further assistance is needed.  Thank-you,  Stephane Fought MSN, RN, CWOCN, [place], CNS [phone]

## 2023-05-29 NOTE — Plan of Care (Signed)
  Problem: Clinical Measurements: Goal: Ability to maintain clinical measurements within normal limits will improve Outcome: Progressing Goal: Will remain free from infection Outcome: Progressing Goal: Cardiovascular complication will be avoided Outcome: Progressing   Problem: Activity: Goal: Risk for activity intolerance will decrease Outcome: Progressing   Problem: Nutrition: Goal: Adequate nutrition will be maintained Outcome: Progressing   Problem: Coping: Goal: Level of anxiety will decrease Outcome: Progressing   Problem: Elimination: Goal: Will not experience complications related to bowel motility Outcome: Progressing Goal: Will not experience complications related to urinary retention Outcome: Progressing   Problem: Safety: Goal: Ability to remain free from injury will improve Outcome: Progressing   Problem: Skin Integrity: Goal: Risk for impaired skin integrity will decrease Outcome: Progressing

## 2023-05-29 NOTE — Plan of Care (Signed)
   Problem: Clinical Measurements: Goal: Ability to maintain clinical measurements within normal limits will improve Outcome: Progressing Goal: Will remain free from infection Outcome: Progressing Goal: Cardiovascular complication will be avoided Outcome: Progressing   Problem: Activity: Goal: Risk for activity intolerance will decrease Outcome: Progressing   Problem: Nutrition: Goal: Adequate nutrition will be maintained Outcome: Progressing

## 2023-05-29 NOTE — Progress Notes (Signed)
    [facility] Progress Note Patient: Natalie Brown FMW:[id number] DOB: [birth year] DOA: 01/22/2023  DOS: the patient was seen and examined on 05/29/2023  Brief Hospital Course: PMH for bipolar disorder with catatonia and HTN as well as early onset dementia, B12 deficiency and severe protein calorie malnutrition. Hospitalized between 9//2023 and 01/22/2023 for similar presentation. Brought back to ED on 01/22/2023 due to agitation. Referred for admission on 03/13/2023 for agitation. Neurology team saw her on 11/25 felt that her presentation is likely related to progression of her underlying neurodegenerative process, has manifested itself as early onset dementia and probably of other psychiatric disorders as well.  No further workup recommended. Palliative care was consulted. 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  Recommend to continue lorazepam .  Patient was on Namenda  on 12/11, which was held on 1/6. Patient was on Zyprexa  which was held by psychiatry 11/28. In December patient was found not a candidate for residential hospice and since she does not have any place for a locked memory care unit she currently remains in the hospital. Goal of care remains comfort care, verified with palliative care as well as psychiatry between the week of 1/22   Due to high fall risk with subsequent falls, an enclosure bed has been provided for patient safety. Mother is aware and agreeable. Will monitor patient closely and change if patient is intolerant.   Assessment and plan. Early onset dementia, neurodegenerative disorder. Bipolar disorder with catatonia. Seen by neurology, psychiatry. Had prolonged hospitalization ongoing since 2023. Palliative care has also seen the patient. Goal of care remains comfort. Patient was on multiple psych medication before  which were gradually tapered off at request of mother including Zyprexa , Namenda . Ativan  dose was also reduced for her catatonia at request of mother due to concerns with medication causing excessive sleepiness. Continue Ativan  0.5 mg 3 times daily. For now we will monitor   Severe protein calorie malnutrition Underweight. Body mass index is 15.17 kg/m.  Placing the patient at a poor risk of poor outcome. Currently comfort care.   CKD stage IIIa Stable.   Primary hypertension No longer on medical therapy. Comfort measures.   Subjective: Resting comfortably, unchanged clinical status.    Physical Exam: General: NAD Cardiovascular: S1, S2 present Respiratory: CTAB Abdomen: Soft, nontender, nondistended, bowel sounds present Musculoskeletal: No bilateral pedal edema noted Skin: Normal Psychiatry: Unable to assess   Disposition: Status is: Inpatient Remains inpatient appropriate because: Awaiting placement at locked memory care unit for ongoing comfort care.   Family Communication: None at bedside Level of care: Med-Surg   Vitals:   05/28/23 0920 05/28/23 1531 05/28/23 2000 05/28/23 2000  BP:  101/76  (!) 118/96  Pulse:    88  Resp:  18 18 18   Temp:  97.6 F (36.4 C)  97.9 F (36.6 C)  TempSrc:  Oral Axillary Axillary  SpO2: 93% (!) 79%  100%  Weight:      Height:         Author: Elvan Sor, MD 05/29/2023 2:50 PM

## 2023-05-30 NOTE — Progress Notes (Signed)
    [facility] Progress Note Patient: Natalie Brown FMW:[id number] DOB: 1965-06-29 DOA: 01/22/2023  DOS: the patient was seen and examined on 05/30/2023  Brief Hospital Course: Patient is a 58 year old female, past medical history significant for bipolar disorder with catatonia, hypertension, onset dementia, B12 deficiency and severe protein calorie malnutrition. Hospitalized between 9//2023 and 01/22/2023 for similar presentation. Brought back to ED on 01/22/2023 due to agitation. Referred for admission on 03/13/2023 for agitation. Neurology team saw her on 11/25 felt that her presentation is likely related to progression of her underlying neurodegenerative process, has manifested itself as early onset dementia and probably of other psychiatric disorders as well.  No further workup recommended. Palliative care was consulted. 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  Recommend to continue lorazepam .  Patient was on Namenda  on 12/11, which was held on 1/6. Patient was on Zyprexa  which was held by psychiatry 11/28. In December patient was found not a candidate for residential hospice and since she does not have any place for a locked memory care unit she currently remains in the hospital. Goal of care remains comfort care, verified with palliative care as well as psychiatry between the week of 1/22   Due to high fall risk with subsequent falls, an enclosure bed has been provided for patient safety. Mother is aware and agreeable. Will monitor patient closely and change if patient is intolerant.  05/30/2023: Patient seen.  No new changes.   Assessment and plan. Early onset dementia, neurodegenerative disorder. Bipolar disorder with catatonia. Seen by neurology, psychiatry. Had prolonged hospitalization ongoing since 2023. Palliative care has also  seen the patient. Goal of care remains comfort. Patient was on multiple psych medication before which were gradually tapered off at request of mother including Zyprexa , Namenda . Ativan  dose was also reduced for her catatonia at request of mother due to concerns with medication causing excessive sleepiness. Continue Ativan  0.5 mg 3 times daily. For now we will monitor 05/30/2023: Pursue disposition.   Severe protein calorie malnutrition Underweight. Body mass index is 15.17 kg/m.  Placing the patient at a poor risk of poor outcome. Currently comfort care.   CKD stage IIIa Stable.   Primary hypertension No longer on medical therapy. Comfort measures.   Subjective: Resting comfortably, unchanged clinical status.    Physical Exam: General: NAD Cardiovascular: S1, S2 present Respiratory: CTAB Abdomen: Soft, nontender, nondistended, bowel sounds present Musculoskeletal: No bilateral pedal edema noted Skin: Normal Psychiatry: Unable to assess   Disposition: Status is: Inpatient Remains inpatient appropriate because: Awaiting placement at locked memory care unit for ongoing comfort care.   Family Communication: None at bedside Level of care: Med-Surg   Vitals:   05/28/23 2000 05/28/23 2000 05/29/23 2000 05/30/23 0440  BP:  (!) 118/96 112/80 105/72  Pulse:  88  84  Resp: 18 18 18 18   Temp:  97.9 F (36.6 C) 97.8 F (36.6 C) 98 F (36.7 C)  TempSrc: Axillary Axillary Oral Oral  SpO2:  100% 99% 100%  Weight:      Height:         Author: Leatrice LILLETTE Chapel, MD 05/30/2023 8:35 PM

## 2023-05-30 NOTE — Plan of Care (Signed)
  Problem: Clinical Measurements: Goal: Ability to maintain clinical measurements within normal limits will improve Outcome: Progressing Goal: Will remain free from infection Outcome: Progressing Goal: Cardiovascular complication will be avoided Outcome: Progressing   Problem: Activity: Goal: Risk for activity intolerance will decrease Outcome: Progressing   Problem: Nutrition: Goal: Adequate nutrition will be maintained Outcome: Progressing   Problem: Coping: Goal: Level of anxiety will decrease Outcome: Progressing   Problem: Elimination: Goal: Will not experience complications related to bowel motility Outcome: Progressing Goal: Will not experience complications related to urinary retention Outcome: Progressing   Problem: Safety: Goal: Ability to remain free from injury will improve Outcome: Progressing   Problem: Skin Integrity: Goal: Risk for impaired skin integrity will decrease Outcome: Progressing

## 2023-05-30 NOTE — Plan of Care (Signed)
   Problem: Clinical Measurements: Goal: Ability to maintain clinical measurements within normal limits will improve Outcome: Progressing Goal: Will remain free from infection Outcome: Progressing Goal: Cardiovascular complication will be avoided Outcome: Progressing   Problem: Activity: Goal: Risk for activity intolerance will decrease Outcome: Progressing   Problem: Nutrition: Goal: Adequate nutrition will be maintained Outcome: Progressing

## 2023-05-31 NOTE — Progress Notes (Signed)
    [facility] Progress Note Patient: Natalie Brown FMW:[id number] DOB: 08/19/1965 DOA: 01/22/2023  DOS: the patient was seen and examined on 05/31/2023  Brief Hospital Course: Patient is a 58 year old female, past medical history significant for bipolar disorder with catatonia, hypertension, dementia, B12 deficiency and severe protein calorie malnutrition.  Patient was admitted with agitation.  Patient was seen by the neurology team on 03/17/2023 and the patient's presentation was felt to be likely related to progression of her underlying neurodegenerative process (manifesting with early onset dementia and other psychiatric disorders, as well).  No further workup was recommended.  Palliative care was consulted and patient was made comfort cares only.  Input from the neurology and psychiatric team is highly appreciated.  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  Psychiatric team recommended continuing lorazepam .  In December patient was found not to be a candidate for residential hospice.  Patient does not have any place for a locked memory care unit.  Patient has remained in the hospital.  Goal of care remains comfort care.     Due to high fall risk with subsequent falls, an enclosure bed has been provided for patient safety. Mother is aware and agreeable.   05/31/2023: Patient seen.  No new changes.   Assessment and plan. Early onset dementia, neurodegenerative disorder. Bipolar disorder with catatonia. Seen by neurology, psychiatry. Had prolonged hospitalization ongoing since 2023. Palliative care has also seen the patient. Goal of care remains comfort. Patient was on multiple psych medication before which were gradually tapered off at request of mother including Zyprexa , Namenda . Ativan  dose was also reduced for her catatonia at request of mother due to concerns with medication causing excessive sleepiness. Continue Ativan  0.5 mg 3 times  daily. For now we will monitor 05/31/2023: Pursue disposition.   Severe protein calorie malnutrition Underweight. Body mass index is 15.17 kg/m.  Placing the patient at a poor risk of poor outcome. Currently comfort care.   CKD stage IIIa Stable.   Primary hypertension No longer on medical therapy. Comfort measures.   Subjective: Resting comfortably, unchanged clinical status.    Physical Exam: General: NAD Cardiovascular: S1, S2 present Respiratory: CTAB Abdomen: Soft, nontender, nondistended, bowel sounds present Musculoskeletal: No bilateral pedal edema noted Skin: Normal Psychiatry: Unable to assess   Disposition: Status is: Inpatient Remains inpatient appropriate because: Awaiting placement at locked memory care unit for ongoing comfort care.   Family Communication: None at bedside Level of care: Med-Surg   Vitals:   05/29/23 2000 05/30/23 0440 05/30/23 2000 05/31/23 0805  BP: 112/80 105/72 114/75 95/67  Pulse:  84 85 78  Resp: 18 18 18 18   Temp: 97.8 F (36.6 C) 98 F (36.7 C) 98.2 F (36.8 C) 98 F (36.7 C)  TempSrc: Oral Oral Oral Oral  SpO2: 99% 100% 99% 98%  Weight:      Height:         Author: Leatrice LILLETTE Chapel, MD 05/31/2023 8:42 AM

## 2023-05-31 NOTE — Plan of Care (Signed)
  Problem: Clinical Measurements: Goal: Ability to maintain clinical measurements within normal limits will improve Outcome: Progressing Goal: Will remain free from infection Outcome: Progressing Goal: Cardiovascular complication will be avoided Outcome: Progressing   Problem: Activity: Goal: Risk for activity intolerance will decrease Outcome: Progressing   Problem: Nutrition: Goal: Adequate nutrition will be maintained Outcome: Progressing   Problem: Coping: Goal: Level of anxiety will decrease Outcome: Progressing   Problem: Elimination: Goal: Will not experience complications related to bowel motility Outcome: Progressing Goal: Will not experience complications related to urinary retention Outcome: Progressing   Problem: Safety: Goal: Ability to remain free from injury will improve Outcome: Progressing   Problem: Skin Integrity: Goal: Risk for impaired skin integrity will decrease Outcome: Progressing

## 2023-06-01 NOTE — Progress Notes (Signed)
    [facility] Progress Note Patient: Natalie Brown FMW:[id number] DOB: 05/02/1965 DOA: 01/22/2023  DOS: the patient was seen and examined on 06/01/2023  Brief Hospital Course: Patient is a 58 year old female, past medical history significant for bipolar disorder with catatonia, hypertension, dementia, B12 deficiency and severe protein calorie malnutrition.  Patient was admitted with agitation.  Patient was seen by the neurology team on 03/17/2023 and the patient's presentation was felt to be likely related to progression of her underlying neurodegenerative process (manifesting with early onset dementia and other psychiatric disorders, as well).  No further workup was recommended.  Palliative care was consulted and patient was made comfort cares only.  Input from the neurology and psychiatric team is highly appreciated.  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  Psychiatric team recommended continuing lorazepam .  In December patient was found not to be a candidate for residential hospice.  Patient does not have any place for a locked memory care unit.  Patient has remained in the hospital.  Goal of care remains comfort care.     Due to high fall risk with subsequent falls, an enclosure bed has been provided for patient safety. Mother is aware and agreeable.   06/01/2023: Patient seen.  No new changes.   Assessment and plan. Early onset dementia, neurodegenerative disorder. Bipolar disorder with catatonia. Seen by neurology, psychiatry. Had prolonged hospitalization ongoing since 2023. Palliative care has also seen the patient. Goal of care remains comfort. Patient was on multiple psych medication before which were gradually tapered off at request of mother including Zyprexa , Namenda . Ativan  dose was also reduced for her catatonia at request of mother due to concerns with medication causing excessive sleepiness. Continue Ativan  0.5 mg 3 times  daily. For now we will monitor 06/01/2023: Pursue disposition.   Severe protein calorie malnutrition Underweight. Body mass index is 15.01 kg/m.  Placing the patient at a poor risk of poor outcome. Currently comfort care.   CKD stage IIIa Stable.   Primary hypertension No longer on medical therapy. Comfort measures.   Subjective: Resting comfortably, unchanged clinical status.    Physical Exam: General: NAD Cardiovascular: S1, S2 present Respiratory: CTAB Abdomen: Soft, nontender, nondistended, bowel sounds present Musculoskeletal: No bilateral pedal edema noted Skin: Normal Psychiatry: Unable to assess   Disposition: Status is: Inpatient Remains inpatient appropriate because: Awaiting placement at locked memory care unit for ongoing comfort care.   Family Communication: None at bedside Level of care: Med-Surg   Vitals:   05/31/23 1548 05/31/23 1907 06/01/23 0500 06/01/23 0913  BP: 106/80 103/76  113/75  Pulse: 91 94  93  Resp: 17 18  18   Temp: 98.5 F (36.9 C) 98.3 F (36.8 C)  98 F (36.7 C)  TempSrc: Oral Oral  Oral  SpO2: 98% 99%  99%  Weight:   42.2 kg   Height:         Author: Leatrice LILLETTE Chapel, MD 06/01/2023 2:56 PM

## 2023-06-01 NOTE — Plan of Care (Signed)
  Problem: Clinical Measurements: Goal: Ability to maintain clinical measurements within normal limits will improve Outcome: Progressing Goal: Will remain free from infection Outcome: Progressing Goal: Cardiovascular complication will be avoided Outcome: Progressing   Problem: Activity: Goal: Risk for activity intolerance will decrease Outcome: Progressing   Problem: Nutrition: Goal: Adequate nutrition will be maintained Outcome: Progressing   Problem: Coping: Goal: Level of anxiety will decrease Outcome: Progressing   Problem: Elimination: Goal: Will not experience complications related to bowel motility Outcome: Progressing Goal: Will not experience complications related to urinary retention Outcome: Progressing   Problem: Safety: Goal: Ability to remain free from injury will improve Outcome: Progressing   Problem: Skin Integrity: Goal: Risk for impaired skin integrity will decrease Outcome: Progressing

## 2023-06-02 NOTE — Progress Notes (Signed)
    [facility] Progress Note Patient: Natalie Brown ZOX:[id number] DOB: 11/02/1965 DOA: 01/22/2023  DOS: the patient was seen and examined on 06/02/2023  Brief Hospital Course: Patient is a 58 year old female, past medical history significant for bipolar disorder with catatonia, hypertension, dementia, B12 deficiency and severe protein calorie malnutrition.  Patient was admitted with agitation.  Patient was seen by the neurology team on 03/17/2023 and the patient's presentation was felt to be likely related to progression of her underlying neurodegenerative process (manifesting with early onset dementia and other psychiatric disorders, as well).  No further workup was recommended.  Palliative care was consulted and patient was made comfort cares only.  Input from the neurology and psychiatric team is highly appreciated.  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  Psychiatric team recommended continuing lorazepam .  In December patient was found not to be a candidate for residential hospice.  Patient does not have any place for a locked memory care unit.  Patient has remained in the hospital.  Goal of care remains comfort care.     Due to high fall risk with subsequent falls, an enclosure bed has been provided for patient safety. Mother is aware and agreeable.   06/02/2023: Patient seen.  No new changes.  Patient's mother, Natalie Brown updated.   Assessment and plan. Early onset dementia, neurodegenerative disorder. Bipolar disorder with catatonia. Seen by neurology, psychiatry. Had prolonged hospitalization ongoing since 2023. Palliative care has also seen the patient. Goal of care remains comfort. Patient was on multiple psych medication before which were gradually tapered off at request of mother including Zyprexa , Namenda . Ativan  dose was also reduced for her catatonia at request of mother due to concerns with medication causing excessive  sleepiness. Continue Ativan  0.5 mg 3 times daily. For now we will monitor 06/02/2023: Pursue disposition.   Severe protein calorie malnutrition Underweight. Body mass index is 14.95 kg/m.  Placing the patient at a poor risk of poor outcome. Currently comfort care.   CKD stage IIIa Stable.   Primary hypertension No longer on medical therapy. Comfort measures.   Subjective: Resting comfortably, unchanged clinical status.    Physical Exam: General: NAD Cardiovascular: S1, S2 present Respiratory: CTAB Abdomen: Soft, nontender, nondistended, bowel sounds present Musculoskeletal: No bilateral pedal edema noted Skin: Normal Psychiatry: Unable to assess   Disposition: Status is: Inpatient Remains inpatient appropriate because: Awaiting placement at locked memory care unit for ongoing comfort care.   Family Communication: None at bedside Level of care: Med-Surg   Vitals:   06/01/23 0500 06/01/23 0913 06/01/23 1547 06/02/23 0500  BP:  113/75 105/75   Pulse:  93 87   Resp:  18 17   Temp:  98 F (36.7 C) 97.8 F (36.6 C)   TempSrc:  Oral Oral   SpO2:  99% 97%   Weight: 42.2 kg   42 kg  Height:         Author: Doroteo Gasmen, MD 06/02/2023 3:32 PM

## 2023-06-02 NOTE — Plan of Care (Signed)
  Problem: Elimination: Goal: Will not experience complications related to bowel motility Outcome: Progressing Goal: Will not experience complications related to urinary retention Outcome: Progressing   

## 2023-06-03 NOTE — Plan of Care (Signed)
  Problem: Clinical Measurements: Goal: Ability to maintain clinical measurements within normal limits will improve 06/03/2023 0414 by London Tarnowski, RN Outcome: Progressing 06/03/2023 0413 by Rheya Minogue, RN Outcome: Progressing Goal: Will remain free from infection 06/03/2023 0414 by Tacia Hindley, RN Outcome: Progressing 06/03/2023 0413 by Roselynn Whitacre, RN Outcome: Progressing Goal: Cardiovascular complication will be avoided 06/03/2023 0414 by Tejuan Gholson, RN Outcome: Progressing 06/03/2023 0413 by Jagger Beahm, RN Outcome: Progressing   Problem: Activity: Goal: Risk for activity intolerance will decrease 06/03/2023 0414 by Canton Yearby, RN Outcome: Progressing 06/03/2023 0413 by Dewie Ahart, RN Outcome: Progressing   Problem: Coping: Goal: Level of anxiety will decrease 06/03/2023 0414 by Genelle Economou, RN Outcome: Progressing 06/03/2023 0413 by Zelig Gacek, RN Outcome: Progressing   Problem: Elimination: Goal: Will not experience complications related to bowel motility 06/03/2023 0414 by Byron Tipping, RN Outcome: Progressing 06/03/2023 0413 by Roselind Klus, RN Outcome: Progressing Goal: Will not experience complications related to urinary retention 06/03/2023 0414 by Cara Aguino, RN Outcome: Progressing 06/03/2023 0413 by Rafaella Kole, RN Outcome: Progressing   Problem: Safety: Goal: Ability to remain free from injury will improve 06/03/2023 0414 by Evertte Sones, RN Outcome: Progressing 06/03/2023 0413 by Belton Peplinski, RN Outcome: Progressing   Problem: Skin Integrity: Goal: Risk for impaired skin integrity will decrease 06/03/2023 0414 by Laney Bagshaw, RN Outcome: Progressing 06/03/2023 0413 by Macaiah Mangal, RN Outcome: Progressing   Problem: Nutrition: Goal: Adequate nutrition will be maintained 06/03/2023 0414 by Brittiney Dicostanzo, RN Outcome: Not Progressing 06/03/2023 0413 by Florentine Diekman, RN Outcome: Progressing

## 2023-06-03 NOTE — Progress Notes (Signed)
    [facility] Progress Note Patient: [name]:[id number] DOB: [birth year] DOA: 01/22/2023  DOS: the patient was seen and examined on 06/03/2023  Brief Hospital Course: Patient is a 58 year old female, past medical history significant for bipolar disorder with catatonia, hypertension, dementia, B12 deficiency and severe protein calorie malnutrition.  Patient was admitted with agitation.  Patient was seen by the neurology team on 03/17/2023 and the patient's presentation was felt to be likely related to progression of her underlying neurodegenerative process (manifesting with early onset dementia and other psychiatric disorders, as well).  No further workup was recommended.  Palliative care was consulted and patient was made comfort cares only.  Input from the neurology and psychiatric team is highly appreciated.  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  Psychiatric team recommended continuing lorazepam.  In December patient was found not to be a candidate for residential hospice.  Patient does not have any place for a locked memory care unit.  Patient has remained in the hospital.  Goal of care remains comfort care.     Due to high fall risk with subsequent falls, an enclosure bed has been provided for patient safety. Mother is aware and agreeable.   06/02/2023: Patient seen.  No new changes.  Patient's mother, [name] updated. 06/03/2023: Patient seen.  No new changes.   Assessment and plan. Early onset dementia, neurodegenerative disorder. Bipolar disorder with catatonia. Seen by neurology, psychiatry. Had prolonged hospitalization ongoing since 2023. Palliative care has also seen the patient. Goal of care remains comfort. Patient was on multiple psych medication before which were gradually tapered off at request of mother including Zyprexa, Namenda. Ativan dose was also reduced for her catatonia at request of mother due to  concerns with medication causing excessive sleepiness. Continue Ativan 0.5 mg 3 times daily. For now we will monitor 06/02/2023: Pursue disposition.   Severe protein calorie malnutrition Underweight. Body mass index is 14.95 kg/m.  Placing the patient at a poor risk of poor outcome. Currently comfort care.   CKD stage IIIa Stable.   Primary hypertension No longer on medical therapy. Comfort measures.   Subjective: Resting comfortably, unchanged clinical status.    Physical Exam: General: NAD Cardiovascular: S1, S2 present Respiratory: CTAB Abdomen: Soft, nontender, nondistended, bowel sounds present Musculoskeletal: No bilateral pedal edema noted Skin: Normal Psychiatry: Unable to assess   Disposition: Status is: Inpatient Remains inpatient appropriate because: Awaiting placement at locked memory care unit for ongoing comfort care.   Family Communication: None at bedside Level of care: Med-Surg   Vitals:   06/02/23 0500 06/02/23 1800 06/02/23 1935 06/02/23 1955  BP:  92/75 106/78   Pulse:  100 92 (!) 101  Resp:   18   Temp:   98.5 F (36.9 C)   TempSrc:      SpO2:   (!) 81% 100%  Weight: 42 kg     Height:         Author: Barnetta Chapel, MD 06/03/2023 8:43 AM

## 2023-06-04 NOTE — Plan of Care (Signed)
  Problem: Clinical Measurements: Goal: Ability to maintain clinical measurements within normal limits will improve Outcome: Not Progressing Goal: Will remain free from infection Outcome: Progressing Goal: Cardiovascular complication will be avoided Outcome: Progressing   Problem: Activity: Goal: Risk for activity intolerance will decrease Outcome: Not Progressing   Problem: Nutrition: Goal: Adequate nutrition will be maintained Outcome: Not Progressing   Problem: Coping: Goal: Level of anxiety will decrease Outcome: Progressing   Problem: Elimination: Goal: Will not experience complications related to bowel motility Outcome: Progressing Goal: Will not experience complications related to urinary retention Outcome: Progressing   Problem: Safety: Goal: Ability to remain free from injury will improve Outcome: Progressing   Problem: Skin Integrity: Goal: Risk for impaired skin integrity will decrease Outcome: Not Progressing

## 2023-06-04 NOTE — Progress Notes (Signed)
  Progress Note   Patient: Natalie Brown ZOX:[id number] DOB: July 29, 1965 DOA: 01/22/2023     83 DOS: the patient was seen and examined on 06/04/2023   Brief hospital course: Patient is a 58 year old female, past medical history significant for bipolar disorder with catatonia, hypertension, dementia, B12 deficiency and severe protein calorie malnutrition.  Patient was admitted with agitation.  Patient was seen by the neurology team on 03/17/2023 and the patient's presentation was felt to be likely related to progression of her underlying neurodegenerative process (manifesting with early onset dementia and other psychiatric disorders, as well).  No further workup was recommended.  Palliative care was consulted and patient was made comfort cares only.  Input from the neurology and psychiatric team is highly appreciated.  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  Psychiatric team recommended continuing lorazepam.  In December patient was found not to be a candidate for residential hospice.  Patient does not have any place for a locked memory care unit.  Patient has remained in the hospital.  Goal of care remains comfort care.      Due to high fall risk with subsequent falls, an enclosure bed has been provided for patient safety. Mother is aware and agreeable.   Assessment and Plan: Early onset dementia, neurodegenerative disorder, bipolar w/ catatonia  - Trazodone 50 mg PO at bedtime PRN  - Ativan 0.5 mg PO tid - Ativan 1 mg IM q6 hr PRN   Severe protein calorie malnutrition  - Feeding supplement bid   CKD 3a - Monitor   HTN  - Monitor  Subjective: Pt seen and examined at the bedside. Pt awaits placement.   Physical Exam: Vitals:   06/02/23 1955 06/03/23 0822 06/03/23 2100 06/04/23 0443  BP:  (!) 90/58 104/62 102/78  Pulse: (!) 101 81 92 87  Resp:  18 18 18   Temp:   98.3 F (36.8 C) 98.5 F (36.9 C)  TempSrc:   Oral Oral  SpO2: 100%  99%  99%  Weight:      Height:       Physical Exam Constitutional:      Comments: Sleeping  HENT:     Head: Normocephalic.  Cardiovascular:     Rate and Rhythm: Tachycardia present.  Pulmonary:     Effort: Pulmonary effort is normal.  Abdominal:     Palpations: Abdomen is soft.  Skin:    General: Skin is warm.  Neurological:     Comments: Unable to evaluate  Psychiatric:     Comments: Unable to evaluate         Disposition: Status is: Inpatient Remains inpatient appropriate because: Awaiting safe disposition  Planned Discharge Destination:  Awaiting safe disposition    Time spent: 35 minutes  Author: Baron Hamper , MD 06/04/2023 10:54 AM  For on call review [email or web address].

## 2023-06-05 LAB — COMPREHENSIVE METABOLIC PANEL
ALT: 20 U/L (ref 0–44)
AST: 19 U/L (ref 15–41)
Albumin: 2.9 g/dL — ABNORMAL LOW (ref 3.5–5.0)
Alkaline Phosphatase: 52 U/L (ref 38–126)
Anion gap: 9 (ref 5–15)
BUN: 29 mg/dL — ABNORMAL HIGH (ref 6–20)
CO2: 27 mmol/L (ref 22–32)
Calcium: 8.8 mg/dL — ABNORMAL LOW (ref 8.9–10.3)
Chloride: 115 mmol/L — ABNORMAL HIGH (ref 98–111)
Creatinine, Ser: 1.22 mg/dL — ABNORMAL HIGH (ref 0.44–1.00)
GFR, Estimated: 52 mL/min — ABNORMAL LOW (ref >60)
Glucose, Bld: 78 mg/dL (ref 70–99)
Potassium: 4.2 mmol/L (ref 3.5–5.1)
Sodium: 151 mmol/L — ABNORMAL HIGH (ref 135–145)
Total Bilirubin: 0.5 mg/dL (ref 0.0–1.2)
Total Protein: 7 g/dL (ref 6.5–8.1)

## 2023-06-05 LAB — CBC
HCT: 43.3 % (ref 36.0–46.0)
Hemoglobin: 13.7 g/dL (ref 12.0–15.0)
MCH: 29.3 pg (ref 26.0–34.0)
MCHC: 31.6 g/dL (ref 30.0–36.0)
MCV: 92.5 fL (ref 80.0–100.0)
Platelets: 265 10*3/uL (ref 150–400)
RBC: 4.68 MIL/uL (ref 3.87–5.11)
RDW: 14.9 % (ref 11.5–15.5)
WBC: 6 10*3/uL (ref 4.0–10.5)
nRBC: 0 % (ref 0.0–0.2)

## 2023-06-05 LAB — MAGNESIUM: Magnesium: 2.5 mg/dL — ABNORMAL HIGH (ref 1.7–2.4)

## 2023-06-05 MED ORDER — DEXTROSE 5 % IV SOLN: INTRAVENOUS | Status: AC

## 2023-06-05 NOTE — Plan of Care (Signed)
  Problem: Clinical Measurements: Goal: Ability to maintain clinical measurements within normal limits will improve 06/05/2023 0106 by Lisabeth Pick, RN Outcome: Not Progressing 06/05/2023 0106 by Lisabeth Pick, RN Outcome: Progressing Goal: Will remain free from infection 06/05/2023 0106 by Lisabeth Pick, RN Outcome: Not Progressing 06/05/2023 0106 by Lisabeth Pick, RN Outcome: Progressing Goal: Cardiovascular complication will be avoided 06/05/2023 0106 by Lisabeth Pick, RN Outcome: Not Progressing 06/05/2023 0106 by Lisabeth Pick, RN Outcome: Progressing   Problem: Activity: Goal: Risk for activity intolerance will decrease 06/05/2023 0106 by Lisabeth Pick, RN Outcome: Not Progressing 06/05/2023 0106 by Lisabeth Pick, RN Outcome: Progressing   Problem: Nutrition: Goal: Adequate nutrition will be maintained 06/05/2023 0106 by Lisabeth Pick, RN Outcome: Not Progressing 06/05/2023 0106 by Lisabeth Pick, RN Outcome: Progressing   Problem: Coping: Goal: Level of anxiety will decrease 06/05/2023 0106 by Lisabeth Pick, RN Outcome: Not Progressing 06/05/2023 0106 by Lisabeth Pick, RN Outcome: Progressing   Problem: Elimination: Goal: Will not experience complications related to bowel motility 06/05/2023 0106 by Lisabeth Pick, RN Outcome: Not Progressing 06/05/2023 0106 by Lisabeth Pick, RN Outcome: Progressing Goal: Will not experience complications related to urinary retention 06/05/2023 0106 by Lisabeth Pick, RN Outcome: Not Progressing 06/05/2023 0106 by Lisabeth Pick, RN Outcome: Progressing   Problem: Safety: Goal: Ability to remain free from injury will improve 06/05/2023 0106 by Lisabeth Pick, RN Outcome: Not Progressing 06/05/2023 0106 by Lisabeth Pick, RN Outcome: Progressing   Problem: Skin Integrity: Goal: Risk for impaired skin integrity will decrease 06/05/2023 0106 by Lisabeth Pick, RN Outcome: Not  Progressing 06/05/2023 0106 by Lisabeth Pick, RN Outcome: Progressing

## 2023-06-05 NOTE — Progress Notes (Signed)
  Progress Note   Patient: Natalie Brown ZOX:[id number] DOB: 03-Mar-1966 DOA: 01/22/2023     84 DOS: the patient was seen and examined on 06/05/2023   Brief hospital course: Patient is a 58 year old female, past medical history significant for bipolar disorder with catatonia, hypertension, dementia, B12 deficiency and severe protein calorie malnutrition.  Patient was admitted with agitation.  Patient was seen by the neurology team on 03/17/2023 and the patient's presentation was felt to be likely related to progression of her underlying neurodegenerative process (manifesting with early onset dementia and other psychiatric disorders, as well).  No further workup was recommended.  Palliative care was consulted and patient was made comfort cares only.  Input from the neurology and psychiatric team is highly appreciated.  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  Psychiatric team recommended continuing lorazepam.  In December patient was found not to be a candidate for residential hospice.  Patient does not have any place for a locked memory care unit.  Patient has remained in the hospital.  Goal of care remains comfort care.      Due to high fall risk with subsequent falls, an enclosure bed has been provided for patient safety. Mother is aware and agreeable.   Assessment and Plan: Early onset dementia, neurodegenerative disorder, bipolar w/ catatonia  - Trazodone 50 mg PO at bedtime PRN  - Ativan 0.5 mg PO tid - Ativan 1 mg IM q6 hr PRN  - Enclosure bed    Severe protein calorie malnutrition  - Feeding supplement bid    CKD 3a - Monitor    HTN  - Monitor  5. HyperNa+ - IV D5W @ 50 cc/hr   Subjective: Pt seen and examined at the bedside. Labs checked today which revealed hyperNa+. IV fluids with D5W were started today. Re-check CMP and Mg in the AM.  Physical Exam: Vitals:   06/03/23 2100 06/04/23 0443 06/04/23 2052 06/05/23 0833  BP: 104/62  102/78 93/76 110/73  Pulse: 92 87 86 78  Resp: 18 18 18 18   Temp: 98.3 F (36.8 C) 98.5 F (36.9 C) 98 F (36.7 C) (!) 97.2 F (36.2 C)  TempSrc: Oral Oral Oral Oral  SpO2: 99% 99%    Weight:      Height:       Constitutional:      Comments: Sleeping  HENT:     Head: Normocephalic.  Cardiovascular:     Rate and Rhythm:RRR Pulmonary:     Effort: Pulmonary effort is normal.  Abdominal:     Palpations: Abdomen is soft.  Skin:    General: Skin is warm.  Neurological:     Comments: Unable to evaluate  Psychiatric:     Comments: Unable to evaluate      Disposition: Status is: Inpatient Remains inpatient appropriate because: Awaiting safe disposition  Planned Discharge Destination:  Per CM dept    Time spent: 35 minutes  Author: Baron Hamper , MD 06/05/2023 2:02 PM  For on call review [email or web address].

## 2023-06-05 NOTE — Plan of Care (Signed)
  Problem: Clinical Measurements: Goal: Will remain free from infection Outcome: Progressing   Problem: Elimination: Goal: Will not experience complications related to bowel motility Outcome: Progressing Goal: Will not experience complications related to urinary retention Outcome: Progressing   Problem: Safety: Goal: Ability to remain free from injury will improve Outcome: Progressing   Problem: Skin Integrity: Goal: Risk for impaired skin integrity will decrease Outcome: Progressing

## 2023-06-06 LAB — COMPREHENSIVE METABOLIC PANEL
ALT: 18 U/L (ref 0–44)
AST: 25 U/L (ref 15–41)
Albumin: 2.6 g/dL — ABNORMAL LOW (ref 3.5–5.0)
Alkaline Phosphatase: 47 U/L (ref 38–126)
Anion gap: 10 (ref 5–15)
BUN: 32 mg/dL — ABNORMAL HIGH (ref 6–20)
CO2: 22 mmol/L (ref 22–32)
Calcium: 8.3 mg/dL — ABNORMAL LOW (ref 8.9–10.3)
Chloride: 113 mmol/L — ABNORMAL HIGH (ref 98–111)
Creatinine, Ser: 0.94 mg/dL (ref 0.44–1.00)
GFR, Estimated: >60 mL/min (ref >60)
Glucose, Bld: 106 mg/dL — ABNORMAL HIGH (ref 70–99)
Potassium: 4.8 mmol/L (ref 3.5–5.1)
Sodium: 145 mmol/L (ref 135–145)
Total Bilirubin: 0.4 mg/dL (ref 0.0–1.2)
Total Protein: 5.9 g/dL — ABNORMAL LOW (ref 6.5–8.1)

## 2023-06-06 LAB — MAGNESIUM: Magnesium: 2.1 mg/dL (ref 1.7–2.4)

## 2023-06-06 MED ORDER — ENOXAPARIN SODIUM 30 MG/0.3ML IJ SOSY
30.0000 mg | PREFILLED_SYRINGE | INTRAMUSCULAR | Status: DC
  Administered 2023-06-06 – 2023-06-18 (×13): 30 mg via SUBCUTANEOUS
  Filled 2023-06-06 (×13): qty 0.3

## 2023-06-06 MED ORDER — LACTATED RINGERS IV BOLUS
500.0000 mL | Freq: Once | INTRAVENOUS | Status: AC
  Administered 2023-06-06: 500 mL via INTRAVENOUS

## 2023-06-06 NOTE — Progress Notes (Signed)
PROGRESS NOTE                                                                                                                                                                                                             Patient Demographics:    Natalie Brown, is a 58 y.o. female, DOB - Sep 30, 1965, WUJ:[id number]  Outpatient Primary MD for the patient is [name], [name], NP    LOS - 85  Admit date - 01/22/2023    Chief Complaint  Patient presents with   Agitation       Brief Narrative (HPI from H&P)   Patient is a 58 year old female, past medical history significant for bipolar disorder with catatonia, hypertension, dementia, B12 deficiency and severe protein calorie malnutrition. Patient was admitted with agitation. Patient was seen by the neurology team on 03/17/2023 and the patient's presentation was felt to be likely related to progression of her underlying neurodegenerative process (manifesting with early onset dementia and other psychiatric disorders, as well). No further workup was recommended. Palliative care was consulted and patient was made comfort cares only. Input from the neurology and psychiatric team is highly appreciated. 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 Psychiatric team recommended continuing lorazepam. In December patient was found not to be a candidate for residential hospice. Patient does not have any place for a locked memory care unit. Patient has remained in the hospital. Goal of care remains comfort care. Due to high fall risk with subsequent falls, an enclosure bed has been provided for patient safety. Mother is aware and agreeable.    Subjective:    Natalie Brown was evaluated at the bed side. Patient laying comfortably in the fetal position. Briefly opens eyes but does not respond to any questions.   Assessment  & Plan :    Assessment and  Plan: Early onset dementia, neurodegenerative disorder, bipolar w/ catatonia  - Trazodone 50 mg PO at bedtime PRN  - Ativan 0.5 mg PO tid - Ativan 1 mg IM q6 hr PRN  - Enclosure bed    Severe protein calorie malnutrition  - Feeding supplement bid    CKD 3a - Stable - Trending renal function   HTN  - BP soft overnight likely d/t dehydration - Pending repeat vitals   5. HyperNa+ - Sodium of  151 on 2/13 likely 2/2 dehydration in the setting of decrease po intake - Completed 24 hrs of IV D5W @ 50 cc/hr  - Pending repeat CMP      Nutrition Problem: Nutrition Problem: Severe Malnutrition Etiology: chronic illness Signs/Symptoms: severe fat depletion, severe muscle depletion, energy intake < or equal to 50% for > or equal to 5 days Interventions: Magic cup  Obesity: Estimated body mass index is 14.95 kg/m as calculated from the following:   Height as of this encounter: 5\' 6"  (1.676 m).   Weight as of this encounter: 42 kg.          Condition - Stable  Family Communication  :  No family at bedside  Code Status :  DNR  Consults  :  None  PUD Prophylaxis : None   Procedures  :     None      Disposition Plan  :  Pending  Status is: Inpatient Remains inpatient appropriate because: Pending safe dispo  DVT Prophylaxis  :  Lovenox       Lab Results  Component Value Date   PLT 265 06/05/2023    Diet :  Diet Order             Diet regular Room service appropriate? Yes; Fluid consistency: Thin  Diet effective now                    Inpatient Medications  Scheduled Meds:  enoxaparin (LOVENOX) injection  30 mg Subcutaneous Q24H   feeding supplement  237 mL Oral BID BM   LORazepam  0.5 mg Oral TID   Continuous Infusions:   PRN Meds:.acetaminophen **OR** acetaminophen, guaiFENesin, hydrALAZINE, lactulose, LORazepam, metoprolol tartrate, ondansetron (ZOFRAN) IV, senna-docusate, traZODone  Antibiotics  :    Anti-infectives (From admission,  onward)    None         Objective:   Vitals:   06/04/23 2052 06/05/23 0833 06/05/23 1933 06/06/23 0531  BP: 93/76 110/73 (!) 82/48 (!) 89/65  Pulse: 86 78 80 (!) 55  Resp: 18 18 18 18   Temp: 98 F (36.7 C) (!) 97.2 F (36.2 C) 97.7 F (36.5 C) 98.7 F (37.1 C)  TempSrc: Oral Oral    SpO2:   100%   Weight:      Height:        Wt Readings from Last 3 Encounters:  06/02/23 42 kg  01/21/23 46 kg  12/24/21 72 kg     Intake/Output Summary (Last 24 hours) at 06/06/2023 0755 Last data filed at 06/06/2023 0403 Gross per 24 hour  Intake 1141.19 ml  Output --  Net 1141.19 ml     Physical Exam  General: Frail middle woman laying in fetal position. NAD HEENT: [place]/AT. Anicteric sclera CV: Regular rate and rhythm Pulmonary: Non labored. No wheezing or rales Abdominal: Soft, Normal bowel sounds. Extremities: Palpable radial and DP pulses. Skin: Warm and dry. Decreased skin tugor.  Neuro: Alert. Does not follow commands.     RN pressure injury documentation:      Data Review:    Recent Labs  Lab 06/05/23 0634  WBC 6.0  HGB 13.7  HCT 43.3  PLT 265  MCV 92.5  MCH 29.3  MCHC 31.6  RDW 14.9    Recent Labs  Lab 06/05/23 0634  NA 151*  K 4.2  CL 115*  CO2 27  ANIONGAP 9  GLUCOSE 78  BUN 29*  CREATININE 1.22*  AST 19  ALT 20  ALKPHOS 52  BILITOT 0.5  ALBUMIN 2.9*  MG 2.5*  CALCIUM 8.8*      Recent Labs  Lab 06/05/23 0634  MG 2.5*  CALCIUM 8.8*    --------------------------------------------------------------------------------------------------------------- Lab Results  Component Value Date   CHOL 173 09/08/2021   HDL 50 09/08/2021   LDLCALC 101 (H) 09/08/2021   TRIG 110 09/08/2021   CHOLHDL 3.5 09/08/2021    Lab Results  Component Value Date   HGBA1C 5.3 09/08/2021   No results for input(s): "TSH", "T4TOTAL", "FREET4", "T3FREE", "THYROIDAB" in the last 72 hours. No results for input(s): "VITAMINB12", "FOLATE", "FERRITIN",  "TIBC", "IRON", "RETICCTPCT" in the last 72 hours. ------------------------------------------------------------------------------------------------------------------ Cardiac Enzymes No results for input(s): "CKMB", "TROPONINI", "MYOGLOBIN" in the last 168 hours.  Invalid input(s): "CK"  Micro Results No results found for this or any previous visit (from the past 240 hours).  Radiology Reports No results found.    Signature  -   Steffanie Rainwater M.D on 06/06/2023 at 7:55 AM   -  To page go to [email or web address]

## 2023-06-06 NOTE — Plan of Care (Signed)
  Problem: Clinical Measurements: Goal: Will remain free from infection Outcome: Progressing   Problem: Elimination: Goal: Will not experience complications related to bowel motility Outcome: Progressing Goal: Will not experience complications related to urinary retention Outcome: Progressing   Problem: Safety: Goal: Ability to remain free from injury will improve Outcome: Progressing   Problem: Skin Integrity: Goal: Risk for impaired skin integrity will decrease Outcome: Progressing

## 2023-06-07 DIAGNOSIS — R000 Tachycardia, unspecified: Secondary | ICD-10-CM

## 2023-06-07 NOTE — Progress Notes (Signed)
Progress Note   Patient: Natalie Brown:[id number] DOB: Nov 21, 1965 DOA: 01/22/2023     86 DOS: the patient was seen and examined on 06/07/2023   Brief hospital course: Natalie Brown is 58 year old female, past medical history significant for bipolar disorder with catatonia, hypertension, dementia, B12 deficiency and severe protein calorie malnutrition. Patient was admitted with agitation. Patient was seen by the neurology team on 03/17/2023 and the patient's presentation was felt to be likely related to progression of her underlying neurodegenerative process (manifesting with early onset dementia and other psychiatric disorders, as well). No further workup was recommended. Palliative care was consulted and patient was made comfort cares only. Input from the neurology and psychiatric team is highly appreciated. 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 Psychiatric team recommended continuing lorazepam. In December patient was found not to be a candidate for residential hospice. Patient does not have any place for a locked memory care unit. Patient has remained in the hospital. Goal of care remains comfort care. Due to high fall risk with subsequent falls, an enclosure bed has been provided for patient safety.   Assessment and Plan: Early onset dementia, neurodegenerative disorder, bipolar w/ catatonia  Continue Trazodone 50 mg PO at bedtime PRN  Ativan 0.5 mg PO tid Ativan 1 mg IM q6 hr PRN  Enclosure bed for safety.   Severe protein calorie malnutrition  Continue feeding supplement bid    Chronic kidney disease stage 3a Stable Avoid nephrotoxic drugs. Trending renal function   Hypertension  BP borderline low. Monitor vitals per floor protocol.   Hypernatremia Sodium improved to 145. Gentle IV hydration as needed.     Comfort measures only. Nursing supportive care. Fall, aspiration precautions.    Code Status: Limited: Do not  attempt resuscitation (DNR) -DNR-LIMITED -Do Not Intubate/DNI   Subjective: Patient is seen and examined today morning. [name] sleeping comfortably. Enclosure bed order renewed.  Physical Exam: Vitals:   06/06/23 0531 06/06/23 1108 06/06/23 2003 06/07/23 0906  BP: (!) 89/65 98/66 101/83 100/76  Pulse: (!) 55 84 84 80  Resp: 18  20   Temp: 98.7 F (37.1 C)  99.8 F (37.7 C) 99 F (37.2 C)  TempSrc:   Oral Oral  SpO2:  100%    Weight:      Height:        General - Middle aged African American female, no apparent distress HEENT - PERRLA, EOMI, atraumatic head, non tender sinuses. Lung - Clear, no rales, rhonchi, wheezes. Heart - S1, S2 heard, no murmurs, rubs, no pedal edema. Abdomen - Soft, non tender, bowel sounds good Neuro - sleeping, does not follow commands, unable to do full neuro exam. Skin - Warm and dry.  Data Reviewed:      Latest Ref Rng & Units 06/05/2023    6:34 AM 03/26/2023   10:19 AM 03/19/2023    4:49 AM  CBC  WBC 4.0 - 10.5 K/uL 6.0  5.5  4.9   Hemoglobin 12.0 - 15.0 g/dL 95.6  21.3  08.6   Hematocrit 36.0 - 46.0 % 43.3  40.6  41.6   Platelets 150 - 400 K/uL 265  288  257       Latest Ref Rng & Units 06/06/2023    8:42 PM 06/05/2023    6:34 AM 03/26/2023   10:19 AM  BMP  Glucose 70 - 99 mg/dL 578  78  89   BUN 6 - 20 mg/dL  32  29  19   Creatinine 0.44 - 1.00 mg/dL 4.09  8.11  9.14   Sodium 135 - 145 mmol/L 145  151  144   Potassium 3.5 - 5.1 mmol/L 4.8  4.2  4.6   Chloride 98 - 111 mmol/L 113  115  113   CO2 22 - 32 mmol/L 22  27  24    Calcium 8.9 - 10.3 mg/dL 8.3  8.8  8.7    No results found.   Disposition: Status is: Inpatient Remains inpatient appropriate because: Safe discharge plan.  Planned Discharge Destination:  TBD     Time spent: 37 minutes  Author: Marcelino Duster, MD 06/07/2023 12:28 PM Secure chat 7am to 7pm For on call review [email or web address].

## 2023-06-07 NOTE — Plan of Care (Signed)
  Problem: Clinical Measurements: Goal: Will remain free from infection Outcome: Progressing   Problem: Nutrition: Goal: Adequate nutrition will be maintained Outcome: Progressing   Problem: Elimination: Goal: Will not experience complications related to bowel motility Outcome: Progressing Goal: Will not experience complications related to urinary retention Outcome: Progressing   Problem: Safety: Goal: Ability to remain free from injury will improve Outcome: Progressing   Problem: Skin Integrity: Goal: Risk for impaired skin integrity will decrease Outcome: Progressing

## 2023-06-08 NOTE — Progress Notes (Signed)
Progress Note   Patient: Natalie Brown VWU:[id number] DOB: 09-Feb-1966 DOA: 01/22/2023     87 DOS: the patient was seen and examined on 06/08/2023   Brief hospital course: Natalie Brown is 58 year old female, past medical history significant for bipolar disorder with catatonia, hypertension, dementia, B12 deficiency and severe protein calorie malnutrition. Patient was admitted with agitation. Patient was seen by the neurology team on 03/17/2023 and the patient's presentation was felt to be likely related to progression of her underlying neurodegenerative process (manifesting with early onset dementia and other psychiatric disorders, as well). No further workup was recommended. Palliative care was consulted and patient was made comfort cares only. Input from the neurology and psychiatric team is highly appreciated. 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 Psychiatric team recommended continuing lorazepam. In December patient was found not to be a candidate for residential hospice. Patient does not have any place for a locked memory care unit. Patient has remained in the hospital. Goal of care remains comfort care. Due to high fall risk with subsequent falls, an enclosure bed has been provided for patient safety.   Assessment and Plan: Early onset dementia, neurodegenerative disorder, bipolar w/ catatonia  Continue Trazodone 50 mg PO at bedtime PRN  Ativan 0.5 mg PO tid Ativan 1 mg IM q6 hr PRN  Enclosure bed for safety renewal order placed.   Severe protein calorie malnutrition  Continue feeding supplement bid    Chronic kidney disease stage 3a Stable Avoid nephrotoxic drugs. Trending renal function   Hypertension  BP borderline low. Monitor vitals per floor protocol.   Hypernatremia Sodium improved to 145. Gentle IV hydration as needed.     Comfort measures only. Nursing supportive care. Fall, aspiration precautions.    Code  Status: Limited: Do not attempt resuscitation (DNR) -DNR-LIMITED -Do Not Intubate/DNI   Subjective: Patient is seen and examined today morning. [name] sleeping comfortably in enclosed bed for safety and falls. No family at bedside.  Physical Exam: Vitals:   06/06/23 2003 06/07/23 0906 06/07/23 2255 06/08/23 0406  BP: 101/83 100/76 (!) 92/54 (!) 130/113  Pulse: 84 80 70 81  Resp: 20  16   Temp: 99.8 F (37.7 C) 99 F (37.2 C)    TempSrc: Oral Oral    SpO2:      Weight:      Height:        General - Middle aged African American female, no apparent distress HEENT - PERRLA, EOMI, atraumatic head, non tender sinuses. Lung - Clear, no rales, rhonchi, wheezes. Heart - S1, S2 heard, no murmurs, rubs, no pedal edema. Abdomen - Soft, non tender, bowel sounds good Neuro - sleeping, does not follow commands, unable to do full neuro exam. Skin - Warm and dry.  Data Reviewed:      Latest Ref Rng & Units 06/05/2023    6:34 AM 03/26/2023   10:19 AM 03/19/2023    4:49 AM  CBC  WBC 4.0 - 10.5 K/uL 6.0  5.5  4.9   Hemoglobin 12.0 - 15.0 g/dL 29.5  62.1  30.8   Hematocrit 36.0 - 46.0 % 43.3  40.6  41.6   Platelets 150 - 400 K/uL 265  288  257       Latest Ref Rng & Units 06/06/2023    8:42 PM 06/05/2023    6:34 AM 03/26/2023   10:19 AM  BMP  Glucose 70 - 99 mg/dL 657  78  89  BUN 6 - 20 mg/dL 32  29  19   Creatinine 0.44 - 1.00 mg/dL 1.61  0.96  0.45   Sodium 135 - 145 mmol/L 145  151  144   Potassium 3.5 - 5.1 mmol/L 4.8  4.2  4.6   Chloride 98 - 111 mmol/L 113  115  113   CO2 22 - 32 mmol/L 22  27  24    Calcium 8.9 - 10.3 mg/dL 8.3  8.8  8.7    No results found.   Disposition: Status is: Inpatient Remains inpatient appropriate because: Safe discharge plan.  Planned Discharge Destination:  TBD       Time spent: 35 minutes  Author: Marcelino Duster, MD 06/08/2023 7:56 AM Secure chat 7am to 7pm For on call review [email or web address].

## 2023-06-08 NOTE — Plan of Care (Signed)
  Problem: Clinical Measurements: Goal: Will remain free from infection Outcome: Progressing   Problem: Nutrition: Goal: Adequate nutrition will be maintained Outcome: Progressing   Problem: Elimination: Goal: Will not experience complications related to bowel motility Outcome: Progressing Goal: Will not experience complications related to urinary retention Outcome: Progressing   Problem: Safety: Goal: Ability to remain free from injury will improve Outcome: Progressing   Problem: Skin Integrity: Goal: Risk for impaired skin integrity will decrease Outcome: Progressing

## 2023-06-09 NOTE — Plan of Care (Signed)
  Problem: Clinical Measurements: Goal: Ability to maintain clinical measurements within normal limits will improve Outcome: Not Progressing Goal: Will remain free from infection Outcome: Not Progressing Goal: Cardiovascular complication will be avoided Outcome: Not Progressing   Problem: Activity: Goal: Risk for activity intolerance will decrease Outcome: Not Progressing   Problem: Nutrition: Goal: Adequate nutrition will be maintained Outcome: Not Progressing   Problem: Coping: Goal: Level of anxiety will decrease Outcome: Not Progressing   Problem: Elimination: Goal: Will not experience complications related to bowel motility Outcome: Not Progressing Goal: Will not experience complications related to urinary retention Outcome: Not Progressing   Problem: Safety: Goal: Ability to remain free from injury will improve Outcome: Not Progressing   Problem: Skin Integrity: Goal: Risk for impaired skin integrity will decrease Outcome: Not Progressing

## 2023-06-09 NOTE — Progress Notes (Signed)
Progress Note   Patient: Natalie Brown ZOX:[id number] DOB: 20-Apr-1966 DOA: 01/22/2023     88 DOS: the patient was seen and examined on 06/09/2023   Brief hospital course: Natalie Brown. Natalie Brown is 58 year old female, past medical history significant for bipolar disorder with catatonia, hypertension, dementia, B12 deficiency and severe protein calorie malnutrition. Patient was admitted with agitation. Patient was seen by the neurology team on 03/17/2023 and the patient's presentation was felt to be likely related to progression of her underlying neurodegenerative process (manifesting with early onset dementia and other psychiatric disorders, as well). No further workup was recommended. Palliative care was consulted and patient was made comfort cares only. Input from the neurology and psychiatric team is highly appreciated. 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 Psychiatric team recommended continuing lorazepam. In December patient was found not to be a candidate for residential hospice. Patient does not have any place for a locked memory care unit. Patient has remained in the hospital. Goal of care remains comfort care. Due to high fall risk with subsequent falls, an enclosure bed has been provided for patient safety.   Assessment and Plan: Early onset dementia, neurodegenerative disorder, bipolar w/ catatonia  Continue Trazodone 50 mg PO at bedtime PRN  Ativan 0.5 mg PO tid Ativan 1 mg IM q6 hr PRN  Enclosure bed for safety renewed.   Severe protein calorie malnutrition  Continue feeding supplement bid    Chronic kidney disease stage 3a Stable Avoid nephrotoxic drugs. Trending renal function   Hypertension  BP borderline low. Monitor vitals per floor protocol.   Hypernatremia Sodium improved to 145. Gentle IV hydration as needed.     Comfort measures only. Nursing supportive care. Fall, aspiration precautions.    Code Status: Limited:  Do not attempt resuscitation (DNR) -DNR-LIMITED -Do Not Intubate/DNI   Subjective: Patient is seen and examined today morning. [name] rolling in her bed. No family at bedside. Enclosure bed renewed.  Physical Exam: Vitals:   06/07/23 0906 06/07/23 2255 06/08/23 0406 06/08/23 2004  BP: 100/76 (!) 92/54 (!) 130/113 (!) 92/57  Pulse: 80 70 81 66  Resp:  16  18  Temp: 99 F (37.2 C)   98.4 F (36.9 C)  TempSrc: Oral   Oral  SpO2:    100%  Weight:      Height:        General - Middle aged African American female,restless, rolling in bed. HEENT - PERRLA, EOMI, atraumatic head, non tender sinuses. Lung - Clear, no rales, rhonchi, wheezes. Heart - S1, S2 heard, no murmurs, rubs, no pedal edema. Abdomen - Soft, non tender, bowel sounds good Neuro - Does not follow commands, unable to do full neuro exam. Skin - Warm and dry.  Data Reviewed:      Latest Ref Rng & Units 06/05/2023    6:34 AM 03/26/2023   10:19 AM 03/19/2023    4:49 AM  CBC  WBC 4.0 - 10.5 K/uL 6.0  5.5  4.9   Hemoglobin 12.0 - 15.0 g/dL 81.1  91.4  78.2   Hematocrit 36.0 - 46.0 % 43.3  40.6  41.6   Platelets 150 - 400 K/uL 265  288  257       Latest Ref Rng & Units 06/06/2023    8:42 PM 06/05/2023    6:34 AM 03/26/2023   10:19 AM  BMP  Glucose 70 - 99 mg/dL 956  78  89   BUN 6 -  20 mg/dL 32  29  19   Creatinine 0.44 - 1.00 mg/dL 6.04  5.40  9.81   Sodium 135 - 145 mmol/L 145  151  144   Potassium 3.5 - 5.1 mmol/L 4.8  4.2  4.6   Chloride 98 - 111 mmol/L 113  115  113   CO2 22 - 32 mmol/L 22  27  24    Calcium 8.9 - 10.3 mg/dL 8.3  8.8  8.7    No results found.   Disposition: Status is: Inpatient Remains inpatient appropriate because: Safe discharge plan.  Planned Discharge Destination:  TBD       Time spent: 36 minutes  Author: Marcelino Duster, MD 06/09/2023 1:51 PM Secure chat 7am to 7pm For on call review [email or web address].

## 2023-06-10 NOTE — TOC Progression Note (Signed)
Transition of Care [facility]) - Progression Note    Patient Details  Name: Natalie Brown MRN: [id number] Date of Birth: [birth year]  Transition of Care [facility] CM/SW Contact  Janae Bridgeman, RN Phone Number: 06/10/2023, 2:29 PM  Clinical Narrative:    No LTC bed offers are available for placement at this time.  Patient remains inpatient since the patient's mother is unable to provide care and housing.  The patient is currently in a Special [facility] Bed at this time to prevent injury/falls.  [facility] will continue to follow the patient.  [facility] recently declined Inpatient HOspice placement since patient continues to eat/drink and does not qualify for inpatient placement.  Hospital leadership is aware of barriers to placement and no availability to bed offers for LTC placement. Patient remains on comfort care orders.        Expected Discharge Plan and Services                                               Social Determinants of Health (SDOH) Interventions SDOH Screenings   Food Insecurity: No Food Insecurity (05/14/2023)  Housing: Low Risk  (01/24/2023)  Transportation Needs: No Transportation Needs (05/14/2023)  Utilities: Not At Risk (05/14/2023)  Alcohol Screen: Low Risk  (09/06/2021)  Depression (PHQ2-9): Medium Risk (05/26/2020)  Tobacco Use: High Risk (04/04/2023)    Readmission Risk Interventions     No data to display

## 2023-06-10 NOTE — Plan of Care (Signed)
  Problem: Clinical Measurements: Goal: Ability to maintain clinical measurements within normal limits will improve Outcome: Progressing Goal: Will remain free from infection Outcome: Progressing Goal: Cardiovascular complication will be avoided Outcome: Progressing   Problem: Activity: Goal: Risk for activity intolerance will decrease Outcome: Progressing   Problem: Nutrition: Goal: Adequate nutrition will be maintained Outcome: Progressing   Problem: Coping: Goal: Level of anxiety will decrease Outcome: Progressing   Problem: Elimination: Goal: Will not experience complications related to bowel motility Outcome: Progressing Goal: Will not experience complications related to urinary retention Outcome: Progressing   Problem: Safety: Goal: Ability to remain free from injury will improve Outcome: Progressing   Problem: Skin Integrity: Goal: Risk for impaired skin integrity will decrease Outcome: Progressing

## 2023-06-10 NOTE — Progress Notes (Signed)
Progress Note   Patient: Natalie Brown:[id number] DOB: December 05, 1965 DOA: 01/22/2023     89 DOS: the patient was seen and examined on 06/10/2023   Brief hospital course: [name]. Crocker is 58 year old female, past medical history significant for bipolar disorder with catatonia, hypertension, dementia, B12 deficiency and severe protein calorie malnutrition. Patient was admitted with agitation. Patient was seen by the neurology team on 03/17/2023 and the patient's presentation was felt to be likely related to progression of her underlying neurodegenerative process (manifesting with early onset dementia and other psychiatric disorders, as well). No further workup was recommended. Palliative care was consulted and patient was made comfort cares only. Input from the neurology and psychiatric team is highly appreciated. 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 Psychiatric team recommended continuing lorazepam. In December patient was found not to be a candidate for residential hospice. Patient does not have any place for a locked memory care unit. Patient has remained in the hospital. Goal of care remains comfort care. Due to high fall risk with subsequent falls, an enclosure bed has been renewed every day for patient safety.   Assessment and Plan: Early onset dementia, neurodegenerative disorder, bipolar w/ catatonia  Continue Trazodone 50 mg PO at bedtime PRN  Ativan 0.5 mg PO tid Ativan 1 mg IM q6 hr PRN  Enclosure bed for safety renewed.   Severe protein calorie malnutrition  Continue feeding supplement bid    Chronic kidney disease stage 3a Stable Avoid nephrotoxic drugs. Trending renal function   Hypertension  BP borderline low. Monitor vitals per floor protocol.   Hypernatremia Sodium improved to 145. Gentle IV hydration as needed.     Comfort measures only. Nursing supportive care. Fall, aspiration precautions.    Code Status:  Limited: Do not attempt resuscitation (DNR) -DNR-LIMITED -Do Not Intubate/DNI   Subjective: Patient is seen and examined today morning. She continues to roll in her bed. No family at bedside. RN at bedside giving her meds. Enclosure bed renewed.  Physical Exam: Vitals:   06/07/23 2255 06/08/23 0406 06/08/23 2004 06/09/23 2015  BP: (!) 92/54 (!) 130/113 (!) 92/57 110/65  Pulse: 70 81 66 84  Resp: 16  18 18   Temp:   98.4 F (36.9 C) 98.2 F (36.8 C)  TempSrc:   Oral Oral  SpO2:   100% 100%  Weight:      Height:        General - Middle aged African American female, restless, rolls in bed. HEENT - PERRLA, EOMI, atraumatic head, non tender sinuses. Lung - Clear, no rales, rhonchi, wheezes. Heart - S1, S2 heard, no murmurs, rubs, no pedal edema. Abdomen - Soft, non tender, bowel sounds good Neuro - Does not follow commands, unable to do full neuro exam. Skin - Warm and dry.  Data Reviewed:      Latest Ref Rng & Units 06/05/2023    6:34 AM 03/26/2023   10:19 AM 03/19/2023    4:49 AM  CBC  WBC 4.0 - 10.5 K/uL 6.0  5.5  4.9   Hemoglobin 12.0 - 15.0 g/dL 29.5  28.4  13.2   Hematocrit 36.0 - 46.0 % 43.3  40.6  41.6   Platelets 150 - 400 K/uL 265  288  257       Latest Ref Rng & Units 06/06/2023    8:42 PM 06/05/2023    6:34 AM 03/26/2023   10:19 AM  BMP  Glucose 70 - 99  mg/dL 161  78  89   BUN 6 - 20 mg/dL 32  29  19   Creatinine 0.44 - 1.00 mg/dL 0.96  0.45  4.09   Sodium 135 - 145 mmol/L 145  151  144   Potassium 3.5 - 5.1 mmol/L 4.8  4.2  4.6   Chloride 98 - 111 mmol/L 113  115  113   CO2 22 - 32 mmol/L 22  27  24    Calcium 8.9 - 10.3 mg/dL 8.3  8.8  8.7    No results found.  Disposition: Status is: Inpatient Remains inpatient appropriate because: Safe discharge plan.  Planned Discharge Destination:  TBD       Time spent: 35 minutes  Author: Marcelino Duster, MD 06/10/2023 3:06 PM Secure chat 7am to 7pm For on call review [email or web address].

## 2023-06-11 NOTE — Plan of Care (Signed)
  Problem: Clinical Measurements: Goal: Ability to maintain clinical measurements within normal limits will improve Outcome: Progressing Goal: Will remain free from infection Outcome: Progressing Goal: Cardiovascular complication will be avoided Outcome: Progressing   Problem: Activity: Goal: Risk for activity intolerance will decrease Outcome: Progressing   Problem: Nutrition: Goal: Adequate nutrition will be maintained Outcome: Progressing   Problem: Coping: Goal: Level of anxiety will decrease Outcome: Progressing   Problem: Elimination: Goal: Will not experience complications related to bowel motility Outcome: Progressing Goal: Will not experience complications related to urinary retention Outcome: Progressing   Problem: Safety: Goal: Ability to remain free from injury will improve Outcome: Progressing   Problem: Skin Integrity: Goal: Risk for impaired skin integrity will decrease Outcome: Progressing

## 2023-06-11 NOTE — Progress Notes (Signed)
PROGRESS NOTE    Natalie Brown  WUJ:[id number] DOB: June 14, 1965 DOA: 01/22/2023 PCP: Lavinia Sharps, NP     Brief Narrative:  Natalie Brown is 58 year old female, past medical history significant for bipolar disorder with catatonia, hypertension, dementia, B12 deficiency and severe protein calorie malnutrition. Patient was admitted with agitation. Patient was seen by the neurology team on 03/17/2023 and the patient's presentation was felt to be likely related to progression of her underlying neurodegenerative process (manifesting with early onset dementia and other psychiatric disorders, as well). No further workup was recommended. Palliative care was consulted and patient was made comfort cares only. Input from the neurology and psychiatric team is highly appreciated. 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 Psychiatric team recommended continuing lorazepam. In December patient was found not to be a candidate for residential hospice. Patient does not have any place for a locked memory care unit. Patient has remained in the hospital. Goal of care remains comfort care. Due to high fall risk with subsequent falls, an enclosure bed has been renewed every day for patient safety.   New events last 24 hours / Subjective: No new events overnight.   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  Avoid nephrotoxic drugs   Hypertension  BP borderline low but stable    Hypernatremia Resolved    DVT prophylaxis:  enoxaparin (LOVENOX) injection 30 mg Start: 06/06/23 1145  Code Status: DNR Family Communication: None at bedside Disposition Plan: Comfort care only  Status is: Inpatient Remains inpatient appropriate because: comfort measures.     Antimicrobials:  Anti-infectives (From admission, onward)    None        Objective: Vitals:   06/08/23 0406 06/08/23 2004 06/09/23 2015 06/11/23 0737  BP: (!) 130/113 (!) 92/57 110/65 101/68  Pulse: 81 66 84 74  Resp:  18 18 17   Temp:  98.4 F (36.9 C) 98.2 F (36.8 C) 97.9 F (36.6 C)  TempSrc:  Oral Oral Oral  SpO2:  100% 100% 99%  Weight:      Height:        Intake/Output Summary (Last 24 hours) at 06/11/2023 1000 Last data filed at 06/11/2023 0946 Gross per 24 hour  Intake 354 ml  Output 2 ml  Net 352 ml   Filed Weights   05/28/23 0500 06/01/23 0500 06/02/23 0500  Weight: 42.6 kg 42.2 kg 42 kg    Examination:  General exam: Appears calm    Data Reviewed: I have personally reviewed following labs and imaging studies  CBC: Recent Labs  Lab 06/05/23 0634  WBC 6.0  HGB 13.7  HCT 43.3  MCV 92.5  PLT 265   Basic Metabolic Panel: Recent Labs  Lab 06/05/23 0634 06/06/23 2042  NA 151* 145  K 4.2 4.8  CL 115* 113*  CO2 27 22  GLUCOSE 78  106*  BUN 29* 32*  CREATININE 1.22* 0.94  CALCIUM 8.8* 8.3*  MG 2.5* 2.1   GFR: Estimated Creatinine Clearance: 44.3 mL/min (by C-G formula based on SCr of 0.94 mg/dL). Liver Function Tests: Recent Labs  Lab 06/05/23 0634 06/06/23 2042  AST 19 25  ALT 20 18  ALKPHOS 52 47  BILITOT 0.5 0.4  PROT 7.0 5.9*  ALBUMIN 2.9* 2.6*   No results for input(s): "LIPASE", "AMYLASE" in the last 168 hours. No results for input(s): "AMMONIA" in the last 168 hours. Coagulation Profile: No results for input(s): "INR", "PROTIME" in the last 168 hours. Cardiac  Enzymes: No results for input(s): "CKTOTAL", "CKMB", "CKMBINDEX", "TROPONINI" in the last 168 hours. BNP (last 3 results) No results for input(s): "PROBNP" in the last 8760 hours. HbA1C: No results for input(s): "HGBA1C" in the last 72 hours. CBG: No results for input(s): "GLUCAP" in the last 168 hours. Lipid Profile: No results for input(s): "CHOL", "HDL", "LDLCALC", "TRIG", "CHOLHDL", "LDLDIRECT" in the last 72 hours. Thyroid Function Tests: No results for input(s): "TSH", "T4TOTAL", "FREET4", "T3FREE", "THYROIDAB" in the last 72 hours. Anemia Panel: No results for input(s): "VITAMINB12", "FOLATE", "FERRITIN", "TIBC", "IRON", "RETICCTPCT" in the last 72 hours. Sepsis Labs: No results for input(s): "PROCALCITON", "LATICACIDVEN" in the last 168 hours.  No results found for this or any previous visit (from the past 240 hours).    Radiology Studies: No results found.    Scheduled Meds:  enoxaparin (LOVENOX) injection  30 mg Subcutaneous Q24H   feeding supplement  237 mL Oral BID BM   LORazepam  0.5 mg Oral TID   Continuous Infusions:   LOS: 90 days   Time spent: 20 minutes   Noralee Stain, DO [facility] 06/11/2023, 10:00 AM   Available via Epic secure chat 7am-7pm After these hours, please refer to coverage provider listed on [email or web address]

## 2023-06-12 NOTE — Progress Notes (Signed)
PROGRESS NOTE    Natalie Brown  LOV:[id number] DOB: [birth year] DOA: 01/22/2023 PCP: Lavinia Sharps, NP     Brief Narrative:  Natalie Brown is 58 year old female, past medical history significant for bipolar disorder with catatonia, hypertension, dementia, B12 deficiency and severe protein calorie malnutrition. Patient was admitted with agitation. Patient was seen by the neurology team on 03/17/2023 and the patient's presentation was felt to be likely related to progression of her underlying neurodegenerative process (manifesting with early onset dementia and other psychiatric disorders, as well). No further workup was recommended. Palliative care was consulted and patient was made comfort cares only. Input from the neurology and psychiatric team is highly appreciated. 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 Psychiatric team recommended continuing lorazepam. In December patient was found not to be a candidate for residential hospice. Patient does not have any place for a locked memory care unit. Patient has remained in the hospital. Goal of care remains comfort care. Due to high fall risk with subsequent falls, an enclosure bed has been renewed every day for patient safety.   New events last 24 hours / Subjective: No change.   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 Avoid  nephrotoxic drugs   Hypertension  BP borderline low but stable    Hypernatremia Resolved    DVT prophylaxis:  enoxaparin (LOVENOX) injection 30 mg Start: 06/06/23 1145  Code Status: DNR Family Communication: None at bedside Disposition Plan: Comfort care only  Status is: Inpatient Remains inpatient appropriate because: comfort measures.     Antimicrobials:  Anti-infectives (From admission, onward)    None        Objective: Vitals:   06/08/23 2004 06/09/23 2015 06/11/23 0737 06/11/23 2211  BP: (!) 92/57 110/65 101/68 (!) 86/64  Pulse: 66 84 74 65  Resp: 18 18 17 17   Temp: 98.4 F (36.9 C) 98.2 F (36.8 C) 97.9 F (36.6 C) (!) 97.5 F (36.4 C)  TempSrc: Oral Oral Oral   SpO2: 100% 100% 99% 100%  Weight:      Height:        Intake/Output Summary (Last 24 hours) at 06/12/2023 1045 Last data filed at 06/12/2023 8841 Gross per 24 hour  Intake 885 ml  Output --  Net 885 ml   Filed Weights   05/28/23 0500 06/01/23 0500 06/02/23 0500  Weight: 42.6 kg 42.2 kg 42 kg    Examination:  General exam: Appears calm    Data Reviewed: I have personally reviewed following labs and imaging studies  CBC: No results for input(s): "WBC", "NEUTROABS", "HGB", "HCT", "MCV", "PLT" in the last 168 hours.  Basic Metabolic Panel: Recent Labs  Lab 06/06/23 2042  NA 145  K 4.8  CL 113*  CO2 22  GLUCOSE 106*  BUN 32*  CREATININE 0.94  CALCIUM 8.3*  MG 2.1  GFR: Estimated Creatinine Clearance: 44.3 mL/min (by C-G formula based on SCr of 0.94 mg/dL). Liver Function Tests: Recent Labs  Lab 06/06/23 2042  AST 25  ALT 18  ALKPHOS 47  BILITOT 0.4  PROT 5.9*  ALBUMIN 2.6*   No results for input(s): "LIPASE", "AMYLASE" in the last 168 hours. No results for input(s): "AMMONIA" in the last 168 hours. Coagulation Profile: No results for input(s): "INR", "PROTIME" in the last 168 hours. Cardiac Enzymes: No results for input(s): "CKTOTAL", "CKMB", "CKMBINDEX",  "TROPONINI" in the last 168 hours. BNP (last 3 results) No results for input(s): "PROBNP" in the last 8760 hours. HbA1C: No results for input(s): "HGBA1C" in the last 72 hours. CBG: No results for input(s): "GLUCAP" in the last 168 hours. Lipid Profile: No results for input(s): "CHOL", "HDL", "LDLCALC", "TRIG", "CHOLHDL", "LDLDIRECT" in the last 72 hours. Thyroid Function Tests: No results for input(s): "TSH", "T4TOTAL", "FREET4", "T3FREE", "THYROIDAB" in the last 72 hours. Anemia Panel: No results for input(s): "VITAMINB12", "FOLATE", "FERRITIN", "TIBC", "IRON", "RETICCTPCT" in the last 72 hours. Sepsis Labs: No results for input(s): "PROCALCITON", "LATICACIDVEN" in the last 168 hours.  No results found for this or any previous visit (from the past 240 hours).    Radiology Studies: No results found.    Scheduled Meds:  enoxaparin (LOVENOX) injection  30 mg Subcutaneous Q24H   feeding supplement  237 mL Oral BID BM   LORazepam  0.5 mg Oral TID   Continuous Infusions:   LOS: 91 days   Time spent: 15 minutes   Noralee Stain, DO [facility] 06/12/2023, 10:45 AM   Available via Epic secure chat 7am-7pm After these hours, please refer to coverage provider listed on [email or web address]

## 2023-06-12 NOTE — Plan of Care (Signed)
  Problem: Clinical Measurements: Goal: Ability to maintain clinical measurements within normal limits will improve Outcome: Progressing Goal: Will remain free from infection Outcome: Progressing Goal: Cardiovascular complication will be avoided Outcome: Progressing   Problem: Activity: Goal: Risk for activity intolerance will decrease Outcome: Progressing   Problem: Nutrition: Goal: Adequate nutrition will be maintained Outcome: Progressing   Problem: Coping: Goal: Level of anxiety will decrease Outcome: Progressing   Problem: Elimination: Goal: Will not experience complications related to bowel motility Outcome: Progressing Goal: Will not experience complications related to urinary retention Outcome: Progressing   Problem: Safety: Goal: Ability to remain free from injury will improve Outcome: Progressing   Problem: Skin Integrity: Goal: Risk for impaired skin integrity will decrease Outcome: Progressing

## 2023-06-13 NOTE — Plan of Care (Signed)
Restraint   Problem: Safety: Goal: Non-violent Restraint(s) Outcome: Not Progressing   Problem: Safety: Goal: Ability to remain free from injury will improve 06/13/2023 1207 by Gershon Crane, RN Outcome: Not Progressing 06/13/2023 1206 by Gershon Crane, RN Outcome: Not Progressing

## 2023-06-13 NOTE — Progress Notes (Signed)
PROGRESS NOTE    Natalie Brown  ZOX:[id number] DOB: 08-13-1965 DOA: 01/22/2023 PCP: Lavinia Sharps, NP     Brief Narrative:  Natalie Brown is 58 year old female, past medical history significant for bipolar disorder with catatonia, hypertension, dementia, B12 deficiency and severe protein calorie malnutrition. Patient was admitted with agitation. Patient was seen by the neurology team on 03/17/2023 and the patient's presentation was felt to be likely related to progression of her underlying neurodegenerative process (manifesting with early onset dementia and other psychiatric disorders, as well). No further workup was recommended. Palliative care was consulted and patient was made comfort cares only. Input from the neurology and psychiatric team is highly appreciated. 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 Psychiatric team recommended continuing lorazepam. In December patient was found not to be a candidate for residential hospice. Patient does not have any place for a locked memory care unit. Patient has remained in the hospital. Goal of care remains comfort care. Due to high fall risk with subsequent falls, an enclosure bed has been renewed every day for patient safety.   New events last 24 hours / Subjective: No change.   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 Avoid  nephrotoxic drugs   Hypertension  BP borderline low but stable    Hypernatremia Resolved    DVT prophylaxis:  enoxaparin (LOVENOX) injection 30 mg Start: 06/06/23 1145  Code Status: DNR Family Communication: None at bedside Disposition Plan: Comfort care only  Status is: Inpatient Remains inpatient appropriate because: comfort measures.     Antimicrobials:  Anti-infectives (From admission, onward)    None        Objective: Vitals:   06/09/23 2015 06/11/23 0737 06/11/23 2211 06/12/23 1940  BP: 110/65 101/68 (!) 86/64 (!) 96/57  Pulse: 84 74 65 (!) 109  Resp: 18 17 17 18   Temp: 98.2 F (36.8 C) 97.9 F (36.6 C) (!) 97.5 F (36.4 C) 98.2 F (36.8 C)  TempSrc: Oral Oral    SpO2: 100% 99% 100% (!) 89%  Weight:      Height:        Intake/Output Summary (Last 24 hours) at 06/13/2023 1047 Last data filed at 06/13/2023 0951 Gross per 24 hour  Intake 218 ml  Output --  Net 218 ml   Filed Weights   05/28/23 0500 06/01/23 0500 06/02/23 0500  Weight: 42.6 kg 42.2 kg 42 kg    Examination:  General exam: Appears calm    Data Reviewed: I have personally reviewed following labs and imaging studies  CBC: No results for input(s): "WBC", "NEUTROABS", "HGB", "HCT", "MCV", "PLT" in the last 168 hours.  Basic Metabolic Panel: Recent Labs  Lab 06/06/23 2042  NA 145  K 4.8  CL 113*  CO2 22  GLUCOSE 106*  BUN 32*  CREATININE 0.94  CALCIUM 8.3*  MG 2.1   GFR: Estimated Creatinine Clearance: 44.3 mL/min (by C-G formula based on SCr of 0.94 mg/dL). Liver Function Tests: Recent Labs  Lab 06/06/23 2042  AST 25  ALT 18  ALKPHOS 47  BILITOT 0.4  PROT 5.9*  ALBUMIN 2.6*   No results for input(s): "LIPASE", "AMYLASE" in the last 168 hours. No results for input(s): "AMMONIA" in the last 168 hours. Coagulation Profile: No results for input(s): "INR", "PROTIME" in the last 168 hours. Cardiac Enzymes: No results for input(s): "CKTOTAL", "CKMB", "CKMBINDEX",  "TROPONINI" in the last 168 hours. BNP (last 3 results) No results for input(s): "PROBNP" in the last 8760 hours. HbA1C: No results for input(s): "HGBA1C" in the last 72 hours. CBG: No results for input(s): "GLUCAP" in the last 168 hours. Lipid Profile: No results for input(s): "CHOL", "HDL", "LDLCALC", "TRIG", "CHOLHDL", "LDLDIRECT" in the last 72 hours. Thyroid Function Tests: No results for input(s): "TSH", "T4TOTAL", "FREET4", "T3FREE", "THYROIDAB" in the last 72 hours. Anemia Panel: No results for input(s): "VITAMINB12", "FOLATE", "FERRITIN", "TIBC", "IRON", "RETICCTPCT" in the last 72 hours. Sepsis Labs: No results for input(s): "PROCALCITON", "LATICACIDVEN" in the last 168 hours.  No results found for this or any previous visit (from the past 240 hours).    Radiology Studies: No results found.    Scheduled Meds:  enoxaparin (LOVENOX) injection  30 mg Subcutaneous Q24H   feeding supplement  237 mL Oral BID BM   LORazepam  0.5 mg Oral TID   Continuous Infusions:   LOS: 92 days   Time spent: 15 minutes   Noralee Stain, DO [facility] 06/13/2023, 10:47 AM   Available via Epic secure chat 7am-7pm After these hours, please refer to coverage provider listed on [email or web address]

## 2023-06-14 NOTE — Progress Notes (Signed)
 PROGRESS NOTE    Natalie Brown  ZOX:[id number] DOB: 1966-04-11 DOA: 01/22/2023 PCP: Lavinia Sharps, NP     Brief Narrative:  Natalie Brown is 58 year old female, past medical history significant for bipolar disorder with catatonia, hypertension, dementia, B12 deficiency and severe protein calorie malnutrition. Patient was admitted with agitation. Patient was seen by the neurology team on 03/17/2023 and the patient's presentation was felt to be likely related to progression of her underlying neurodegenerative process (manifesting with early onset dementia and other psychiatric disorders, as well). No further workup was recommended. Palliative care was consulted and patient was made comfort cares only. Input from the neurology and psychiatric team is highly appreciated. 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 Psychiatric team recommended continuing lorazepam. In December patient was found not to be a candidate for residential hospice. Patient does not have any place for a locked memory care unit. Patient has remained in the hospital. Goal of care remains comfort care. Due to high fall risk with subsequent falls, an enclosure bed has been renewed every day for patient safety.   New events last 24 hours / Subjective: No change.   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 Avoid  nephrotoxic drugs   Hypertension  BP borderline low but stable    Hypernatremia Resolved    DVT prophylaxis:  enoxaparin (LOVENOX) injection 30 mg Start: 06/06/23 1145  Code Status: DNR Family Communication: None at bedside Disposition Plan: Comfort care only  Status is: Inpatient Remains inpatient appropriate because: comfort measures.     Antimicrobials:  Anti-infectives (From admission, onward)    None        Objective: Vitals:   06/12/23 1940 06/13/23 1753 06/13/23 2014 06/14/23 0300  BP: (!) 96/57 109/68 101/72 109/82  Pulse: (!) 109 (!) 51 (!) 107 98  Resp: 18 18 18 18   Temp: 98.2 F (36.8 C) 98.8 F (37.1 C) 98.9 F (37.2 C)   TempSrc:   Oral   SpO2: (!) 89%  100% 100%  Weight:      Height:        Intake/Output Summary (Last 24 hours) at 06/14/2023 1100 Last data filed at 06/14/2023 1019 Gross per 24 hour  Intake 658 ml  Output --  Net 658 ml   Filed Weights   05/28/23 0500 06/01/23 0500 06/02/23 0500  Weight: 42.6 kg 42.2 kg 42 kg    Examination:  General exam: Appears calm    Data Reviewed: I have personally reviewed following labs and imaging studies  CBC: No results for input(s): "WBC", "NEUTROABS", "HGB", "HCT", "MCV", "PLT" in the last 168 hours.  Basic Metabolic Panel: No results for input(s): "NA", "K", "CL", "CO2", "GLUCOSE", "BUN", "CREATININE", "CALCIUM", "MG", "PHOS" in the last 168 hours.  GFR: Estimated Creatinine Clearance: 44.3 mL/min (by C-G formula based on SCr of 0.94  mg/dL). Liver Function Tests: No results for input(s): "AST", "ALT", "ALKPHOS", "BILITOT", "PROT", "ALBUMIN" in the last 168 hours.  No results for input(s): "LIPASE", "AMYLASE" in the last 168 hours. No results for input(s): "AMMONIA" in the last 168 hours. Coagulation Profile: No results for input(s): "INR", "PROTIME" in the last 168 hours. Cardiac Enzymes: No results for input(s): "CKTOTAL", "CKMB", "CKMBINDEX", "TROPONINI" in the last 168  hours. BNP (last 3 results) No results for input(s): "PROBNP" in the last 8760 hours. HbA1C: No results for input(s): "HGBA1C" in the last 72 hours. CBG: No results for input(s): "GLUCAP" in the last 168 hours. Lipid Profile: No results for input(s): "CHOL", "HDL", "LDLCALC", "TRIG", "CHOLHDL", "LDLDIRECT" in the last 72 hours. Thyroid Function Tests: No results for input(s): "TSH", "T4TOTAL", "FREET4", "T3FREE", "THYROIDAB" in the last 72 hours. Anemia Panel: No results for input(s): "VITAMINB12", "FOLATE", "FERRITIN", "TIBC", "IRON", "RETICCTPCT" in the last 72 hours. Sepsis Labs: No results for input(s): "PROCALCITON", "LATICACIDVEN" in the last 168 hours.  No results found for this or any previous visit (from the past 240 hours).    Radiology Studies: No results found.    Scheduled Meds:  enoxaparin (LOVENOX) injection  30 mg Subcutaneous Q24H   feeding supplement  237 mL Oral BID BM   LORazepam  0.5 mg Oral TID   Continuous Infusions:   LOS: 93 days   Time spent: 15 minutes   Noralee Stain, DO [facility] 06/14/2023, 11:00 AM   Available via Epic secure chat 7am-7pm After these hours, please refer to coverage provider listed on [email or web address]

## 2023-06-15 NOTE — Plan of Care (Signed)
  Problem: Clinical Measurements: Goal: Will remain free from infection Outcome: Progressing   Problem: Activity: Goal: Risk for activity intolerance will decrease Outcome: Progressing   Problem: Elimination: Goal: Will not experience complications related to urinary retention Outcome: Progressing   Problem: Safety: Goal: Ability to remain free from injury will improve Outcome: Progressing   Problem: Skin Integrity: Goal: Risk for impaired skin integrity will decrease Outcome: Progressing   Problem: Safety: Goal: Non-violent Restraint(s) Outcome: Progressing

## 2023-06-15 NOTE — Progress Notes (Signed)
 PROGRESS NOTE    Natalie Brown  ZOX:[id number] DOB: [birth year] DOA: 01/22/2023 PCP: Lavinia Sharps, NP     Brief Narrative:  Natalie Brown. Klute is 58 year old female, past medical history significant for bipolar disorder with catatonia, hypertension, dementia, B12 deficiency and severe protein calorie malnutrition. Patient was admitted with agitation. Patient was seen by the neurology team on 03/17/2023 and the patient's presentation was felt to be likely related to progression of her underlying neurodegenerative process (manifesting with early onset dementia and other psychiatric disorders, as well). No further workup was recommended. Palliative care was consulted and patient was made comfort cares only. Input from the neurology and psychiatric team is highly appreciated. 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 Psychiatric team recommended continuing lorazepam. In December patient was found not to be a candidate for residential hospice. Patient does not have any place for a locked memory care unit. Patient has remained in the hospital. Goal of care remains comfort care. Due to high fall risk with subsequent falls, an enclosure bed has been renewed every day for patient safety.   New events last 24 hours / Subjective: No change.   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 Avoid  nephrotoxic drugs   Hypertension  BP borderline low but stable    Hypernatremia Resolved    DVT prophylaxis:  enoxaparin (LOVENOX) injection 30 mg Start: 06/06/23 1145  Code Status: DNR Family Communication: None at bedside Disposition Plan: Comfort care only  Status is: Inpatient Remains inpatient appropriate because: comfort measures.     Antimicrobials:  Anti-infectives (From admission, onward)    None        Objective: Vitals:   06/13/23 1753 06/13/23 2014 06/14/23 0300 06/14/23 1857  BP: 109/68 101/72 109/82 (!) 123/112  Pulse: (!) 51 (!) 107 98 79  Resp: 18 18 18 18   Temp: 98.8 F (37.1 C) 98.9 F (37.2 C)  98.6 F (37 C)  TempSrc:  Oral    SpO2:  100% 100%   Weight:      Height:        Intake/Output Summary (Last 24 hours) at 06/15/2023 1051 Last data filed at 06/14/2023 1400 Gross per 24 hour  Intake 100 ml  Output --  Net 100 ml   Filed Weights   05/28/23 0500 06/01/23 0500 06/02/23 0500  Weight: 42.6 kg 42.2 kg 42 kg    Examination:  General exam: Appears calm    Data Reviewed: I have personally reviewed following labs and imaging studies  CBC: No results for input(s): "WBC", "NEUTROABS", "HGB", "HCT", "MCV", "PLT" in the last 168 hours.  Basic Metabolic Panel: No results for input(s): "NA", "K", "CL", "CO2", "GLUCOSE", "BUN", "CREATININE", "CALCIUM", "MG", "PHOS" in the last 168 hours.  GFR: Estimated Creatinine Clearance: 44.3 mL/min (by C-G formula based on SCr of 0.94 mg/dL). Liver  Function Tests: No results for input(s): "AST", "ALT", "ALKPHOS", "BILITOT", "PROT", "ALBUMIN" in the last 168 hours.  No results for input(s): "LIPASE", "AMYLASE" in the last 168 hours. No results for input(s): "AMMONIA" in the last 168 hours. Coagulation Profile: No results for input(s): "INR", "PROTIME" in the last 168 hours. Cardiac Enzymes: No results for input(s): "CKTOTAL", "CKMB", "CKMBINDEX", "TROPONINI" in the last 168 hours. BNP (last 3  results) No results for input(s): "PROBNP" in the last 8760 hours. HbA1C: No results for input(s): "HGBA1C" in the last 72 hours. CBG: No results for input(s): "GLUCAP" in the last 168 hours. Lipid Profile: No results for input(s): "CHOL", "HDL", "LDLCALC", "TRIG", "CHOLHDL", "LDLDIRECT" in the last 72 hours. Thyroid Function Tests: No results for input(s): "TSH", "T4TOTAL", "FREET4", "T3FREE", "THYROIDAB" in the last 72 hours. Anemia Panel: No results for input(s): "VITAMINB12", "FOLATE", "FERRITIN", "TIBC", "IRON", "RETICCTPCT" in the last 72 hours. Sepsis Labs: No results for input(s): "PROCALCITON", "LATICACIDVEN" in the last 168 hours.  No results found for this or any previous visit (from the past 240 hours).    Radiology Studies: No results found.    Scheduled Meds:  enoxaparin (LOVENOX) injection  30 mg Subcutaneous Q24H   feeding supplement  237 mL Oral BID BM   LORazepam  0.5 mg Oral TID   Continuous Infusions:   LOS: 94 days   Time spent: 15 minutes   Noralee Stain, DO [facility] 06/15/2023, 10:51 AM   Available via Epic secure chat 7am-7pm After these hours, please refer to coverage provider listed on [email or web address]

## 2023-06-16 NOTE — Plan of Care (Signed)
  Problem: Clinical Measurements: Goal: Ability to maintain clinical measurements within normal limits will improve Outcome: Progressing Goal: Will remain free from infection Outcome: Progressing Goal: Cardiovascular complication will be avoided Outcome: Progressing   Problem: Activity: Goal: Risk for activity intolerance will decrease Outcome: Progressing   Problem: Nutrition: Goal: Adequate nutrition will be maintained Outcome: Progressing   Problem: Coping: Goal: Level of anxiety will decrease Outcome: Progressing   Problem: Elimination: Goal: Will not experience complications related to bowel motility Outcome: Progressing Goal: Will not experience complications related to urinary retention Outcome: Progressing   Problem: Safety: Goal: Ability to remain free from injury will improve Outcome: Progressing   Problem: Skin Integrity: Goal: Risk for impaired skin integrity will decrease Outcome: Progressing   Problem: Safety: Goal: Non-violent Restraint(s) Outcome: Progressing

## 2023-06-16 NOTE — Progress Notes (Signed)
 PROGRESS NOTE    Natalie Brown  ZHY:[id number] DOB: 01-16-1966 DOA: 01/22/2023 PCP: Lavinia Sharps, NP     Brief Narrative:  Natalie Schlein. Brown is 58 year old female, past medical history significant for bipolar disorder with catatonia, hypertension, dementia, B12 deficiency and severe protein calorie malnutrition. Patient was admitted with agitation. Patient was seen by the neurology team on 03/17/2023 and the patient's presentation was felt to be likely related to progression of her underlying neurodegenerative process (manifesting with early onset dementia and other psychiatric disorders, as well). No further workup was recommended. Palliative care was consulted and patient was made comfort cares only. Input from the neurology and psychiatric team is highly appreciated. 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 Psychiatric team recommended continuing lorazepam. In December patient was found not to be a candidate for residential hospice. Patient does not have any place for a locked memory care unit. Patient has remained in the hospital. Goal of care remains comfort care. Due to high fall risk with subsequent falls, an enclosure bed has been renewed every day for patient safety.   New events last 24 hours / Subjective: No change.   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 Avoid  nephrotoxic drugs   Hypertension  BP borderline low but stable    Hypernatremia Resolved    DVT prophylaxis:  enoxaparin (LOVENOX) injection 30 mg Start: 06/06/23 1145  Code Status: DNR Family Communication: None at bedside Disposition Plan: Comfort care only  Status is: Inpatient Remains inpatient appropriate because: comfort measures.     Antimicrobials:  Anti-infectives (From admission, onward)    None        Objective: Vitals:   06/14/23 1857 06/15/23 1613 06/15/23 2340 06/16/23 0725  BP: (!) 123/112 112/70 96/78 105/74  Pulse: 79 76 75 72  Resp: 18 18 18 17   Temp: 98.6 F (37 C) 98.2 F (36.8 C) 98.4 F (36.9 C) (!) 97.4 F (36.3 C)  TempSrc:   Oral Oral  SpO2:  100% 100% 100%  Weight:      Height:        Intake/Output Summary (Last 24 hours) at 06/16/2023 1137 Last data filed at 06/16/2023 2952 Gross per 24 hour  Intake 443 ml  Output --  Net 443 ml   Filed Weights   05/28/23 0500 06/01/23 0500 06/02/23 0500  Weight: 42.6 kg 42.2 kg 42 kg    Examination:  General exam: Appears calm    Data Reviewed: I have personally reviewed following labs and imaging studies  CBC: No results for input(s): "WBC", "NEUTROABS", "HGB", "HCT", "MCV", "PLT" in the last 168 hours.  Basic Metabolic Panel: No results for input(s): "NA", "K", "CL", "CO2", "GLUCOSE", "BUN", "CREATININE", "CALCIUM", "MG", "PHOS" in the last 168 hours.  GFR: Estimated Creatinine Clearance: 44.3 mL/min (by C-G formula based on SCr of 0.94  mg/dL). Liver Function Tests: No results for input(s): "AST", "ALT", "ALKPHOS", "BILITOT", "PROT", "ALBUMIN" in the last 168 hours.  No results for input(s): "LIPASE", "AMYLASE" in the last 168 hours. No results for input(s): "AMMONIA" in the last 168 hours. Coagulation Profile: No results for input(s): "INR", "PROTIME" in the last 168 hours. Cardiac Enzymes: No results for input(s): "CKTOTAL", "CKMB", "CKMBINDEX", "TROPONINI" in the last 168  hours. BNP (last 3 results) No results for input(s): "PROBNP" in the last 8760 hours. HbA1C: No results for input(s): "HGBA1C" in the last 72 hours. CBG: No results for input(s): "GLUCAP" in the last 168 hours. Lipid Profile: No results for input(s): "CHOL", "HDL", "LDLCALC", "TRIG", "CHOLHDL", "LDLDIRECT" in the last 72 hours. Thyroid Function Tests: No results for input(s): "TSH", "T4TOTAL", "FREET4", "T3FREE", "THYROIDAB" in the last 72 hours. Anemia Panel: No results for input(s): "VITAMINB12", "FOLATE", "FERRITIN", "TIBC", "IRON", "RETICCTPCT" in the last 72 hours. Sepsis Labs: No results for input(s): "PROCALCITON", "LATICACIDVEN" in the last 168 hours.  No results found for this or any previous visit (from the past 240 hours).    Radiology Studies: No results found.    Scheduled Meds:  enoxaparin (LOVENOX) injection  30 mg Subcutaneous Q24H   feeding supplement  237 mL Oral BID BM   LORazepam  0.5 mg Oral TID   Continuous Infusions:   LOS: 95 days   Time spent: 15 minutes   Noralee Stain, DO [facility] 06/16/2023, 11:37 AM   Available via Epic secure chat 7am-7pm After these hours, please refer to coverage provider listed on [email or web address]

## 2023-06-17 NOTE — Plan of Care (Signed)
  Problem: Clinical Measurements: Goal: Ability to maintain clinical measurements within normal limits will improve Outcome: Progressing Goal: Will remain free from infection Outcome: Progressing Goal: Cardiovascular complication will be avoided Outcome: Progressing   Problem: Activity: Goal: Risk for activity intolerance will decrease Outcome: Progressing   Problem: Nutrition: Goal: Adequate nutrition will be maintained Outcome: Progressing   Problem: Coping: Goal: Level of anxiety will decrease Outcome: Progressing   Problem: Elimination: Goal: Will not experience complications related to bowel motility Outcome: Progressing Goal: Will not experience complications related to urinary retention Outcome: Progressing   Problem: Safety: Goal: Ability to remain free from injury will improve Outcome: Progressing   Problem: Skin Integrity: Goal: Risk for impaired skin integrity will decrease Outcome: Progressing   Problem: Safety: Goal: Non-violent Restraint(s) Outcome: Progressing

## 2023-06-17 NOTE — Progress Notes (Signed)
 PROGRESS NOTE    Natalie Brown  ZOX:[id number] DOB: [birth year] DOA: 01/22/2023 PCP: Lavinia Sharps, NP     Brief Narrative:  Natalie Brown is 58 year old female, past medical history significant for bipolar disorder with catatonia, hypertension, dementia, B12 deficiency and severe protein calorie malnutrition. Patient was admitted with agitation. Patient was seen by the neurology team on 03/17/2023 and the patient's presentation was felt to be likely related to progression of her underlying neurodegenerative process (manifesting with early onset dementia and other psychiatric disorders, as well). No further workup was recommended. Palliative care was consulted and patient was made comfort cares only. Input from the neurology and psychiatric team is highly appreciated. 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 Psychiatric team recommended continuing lorazepam. In December patient was found not to be a candidate for residential hospice. Patient does not have any place for a locked memory care unit. Patient has remained in the hospital. Goal of care remains comfort care. Due to high fall risk with subsequent falls, an enclosure bed has been renewed every day for patient safety.   New events last 24 hours / Subjective: No change.   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 Avoid nephrotoxic drugs   Hypertension  BP  borderline low but stable    Hypernatremia Resolved    DVT prophylaxis:  enoxaparin (LOVENOX) injection 30 mg Start: 06/06/23 1145  Code Status: DNR Family Communication: None at bedside Disposition Plan: Comfort care only  Status is: Inpatient Remains inpatient appropriate because: comfort measures.     Antimicrobials:  Anti-infectives (From admission, onward)    None        Objective: Vitals:   06/15/23 1613 06/15/23 2340 06/16/23 0725 06/16/23 2100  BP: 112/70 96/78 105/74 97/72  Pulse: 76 75 72 85  Resp: 18 18 17    Temp: 98.2 F (36.8 C) 98.4 F (36.9 C) (!) 97.4 F (36.3 C) 97.8 F (36.6 C)  TempSrc:  Oral Oral Oral  SpO2: 100% 100% 100% 100%  Weight:      Height:        Intake/Output Summary (Last 24 hours) at 06/17/2023 1150 Last data filed at 06/17/2023 8119 Gross per 24 hour  Intake 236 ml  Output --  Net 236 ml   Filed Weights   05/28/23 0500 06/01/23 0500 06/02/23 0500  Weight: 42.6 kg 42.2 kg 42 kg    Examination:  General exam: Appears calm    Data Reviewed: I have personally reviewed following labs and imaging studies  CBC: No results for input(s): "WBC", "NEUTROABS", "HGB", "HCT", "MCV", "PLT" in the last 168 hours.  Basic Metabolic Panel: No results for input(s): "NA", "K", "CL", "CO2", "GLUCOSE", "BUN", "CREATININE", "CALCIUM", "MG", "PHOS" in the last 168 hours.  GFR: Estimated Creatinine Clearance: 44.3 mL/min (by C-G formula based on SCr of 0.94 mg/dL). Liver Function Tests: No results for input(s): "AST", "ALT", "ALKPHOS", "BILITOT", "PROT", "ALBUMIN"  in the last 168 hours.  No results for input(s): "LIPASE", "AMYLASE" in the last 168 hours. No results for input(s): "AMMONIA" in the last 168 hours. Coagulation Profile: No results for input(s): "INR", "PROTIME" in the last 168 hours. Cardiac Enzymes: No results for input(s): "CKTOTAL", "CKMB", "CKMBINDEX", "TROPONINI" in the last 168 hours. BNP (last 3 results) No  results for input(s): "PROBNP" in the last 8760 hours. HbA1C: No results for input(s): "HGBA1C" in the last 72 hours. CBG: No results for input(s): "GLUCAP" in the last 168 hours. Lipid Profile: No results for input(s): "CHOL", "HDL", "LDLCALC", "TRIG", "CHOLHDL", "LDLDIRECT" in the last 72 hours. Thyroid Function Tests: No results for input(s): "TSH", "T4TOTAL", "FREET4", "T3FREE", "THYROIDAB" in the last 72 hours. Anemia Panel: No results for input(s): "VITAMINB12", "FOLATE", "FERRITIN", "TIBC", "IRON", "RETICCTPCT" in the last 72 hours. Sepsis Labs: No results for input(s): "PROCALCITON", "LATICACIDVEN" in the last 168 hours.  No results found for this or any previous visit (from the past 240 hours).    Radiology Studies: No results found.    Scheduled Meds:  enoxaparin (LOVENOX) injection  30 mg Subcutaneous Q24H   feeding supplement  237 mL Oral BID BM   LORazepam  0.5 mg Oral TID   Continuous Infusions:   LOS: 96 days   Time spent: 15 minutes   Noralee Stain, DO [facility] 06/17/2023, 11:50 AM   Available via Epic secure chat 7am-7pm After these hours, please refer to coverage provider listed on [email or web address]

## 2023-06-18 ENCOUNTER — Encounter (HOSPITAL_COMMUNITY): Payer: Self-pay | Admitting: Internal Medicine

## 2023-06-18 DIAGNOSIS — N1831 Chronic kidney disease, stage 3a: Secondary | ICD-10-CM

## 2023-06-18 NOTE — Assessment & Plan Note (Addendum)
 No further lab draws since 5/15 when creatinine was 1.02 with a GFR greater than 60

## 2023-06-18 NOTE — Assessment & Plan Note (Addendum)
Continue Ensure.

## 2023-06-18 NOTE — Plan of Care (Signed)
  Problem: Clinical Measurements: Goal: Ability to maintain clinical measurements within normal limits will improve Outcome: Progressing Goal: Will remain free from infection Outcome: Progressing Goal: Cardiovascular complication will be avoided Outcome: Progressing   Problem: Activity: Goal: Risk for activity intolerance will decrease Outcome: Progressing   Problem: Nutrition: Goal: Adequate nutrition will be maintained Outcome: Progressing   Problem: Coping: Goal: Level of anxiety will decrease Outcome: Progressing   Problem: Elimination: Goal: Will not experience complications related to bowel motility Outcome: Progressing Goal: Will not experience complications related to urinary retention Outcome: Progressing   Problem: Safety: Goal: Ability to remain free from injury will improve Outcome: Progressing   Problem: Skin Integrity: Goal: Risk for impaired skin integrity will decrease Outcome: Progressing   Problem: Safety: Goal: Non-violent Restraint(s) Outcome: Progressing

## 2023-06-18 NOTE — Assessment & Plan Note (Addendum)
 Currently on Ativan  3 times a day and as needed Valium .  Patient is mostly nonverbal and does not follow commands.  Patient in a safety enclosure bed.

## 2023-06-18 NOTE — Assessment & Plan Note (Addendum)
 Symptomatic management

## 2023-06-18 NOTE — Assessment & Plan Note (Addendum)
 Not currently on medication since comfort care measures

## 2023-06-18 NOTE — Subjective & Objective (Signed)
 Pt seen and examined. Remains on bed enclosure. Pt sleeping

## 2023-06-18 NOTE — Progress Notes (Signed)
 PROGRESS NOTE    Natalie Brown  ZOX:[id number] DOB: 1965-12-10 DOA: 01/22/2023 PCP: Lavinia Sharps, NP  Subjective: Pt seen and examined. Remains on bed enclosure. Rolling around in bed. NT attempting to feed pt.    Hospital Course: HPI/brief hospital course:  Natalie Schlein. Brown is 58 year old female, past medical history significant for bipolar disorder with catatonia, hypertension, dementia, B12 deficiency and severe protein calorie malnutrition. Patient was admitted with agitation.   Hospitalized between 9//2023 and 01/22/2023 for similar presentation. Brought back to ED on 01/22/2023 due to agitation. Referred for admission on 03/13/2023 for agitation.  Per MOST form after discussion with the family, patient was changed to comfort measure 03/13/23  Patient was seen by the neurology team on 03/17/2023 and the patient's presentation was felt to be likely related to progression of her underlying neurodegenerative process (manifesting with early onset dementia and other psychiatric disorders, as well). No further workup was recommended. Palliative care was consulted and patient was made comfort cares only.   Input from the neurology and psychiatric team is highly appreciated. 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   Psychiatric team recommended continuing lorazepam. In December 2024, patient was found not to be a candidate for residential hospice. Patient does not have any place for a locked memory care unit. Patient has remained in the hospital. Goal of care remains comfort care. Due to high fall risk with subsequent falls, an enclosure bed has been renewed every day for patient safety.   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  Recommend to continue lorazepam.  Patient was on Namenda on 04/02/2023, which was held on 04/28/2023 . Patient was on Zyprexa which was held by psychiatry  03/19/2024 . Goal of care remains comfort care, verified with palliative care as well as psychiatry between the week of 05/14/2023 - 05/20/2023.    Safety sitter needed due to patient's high fall risk.   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 Enclosure bed for safety - needs to be renewed daily   Terminal care Has MOST form completed 12-19-2022. Comfort care. Pt is terminal care/comfort care. Will stop SQ lovenox.     CKD stage 3a, GFR 45-59 ml/min (HCC) 06-18-2023 pt is terminal care. Stable. Not checking Scr.  Protein-calorie malnutrition, severe 06-18-2023 Continue feeding supplement BID   Essential hypertension 06-18-2023 stable. Not on any HTN meds.   DVT prophylaxis:     Code Status: Limited: Do not attempt resuscitation (DNR) -DNR-LIMITED -Do Not Intubate/DNI  Family Communication: no family at bedside Disposition Plan: unknown. No LTC beds being offered. TOC still following pt's case. Reason for continuing need for hospitalization: medically stable for DC.  Objective: Vitals:   06/17/23 1547 06/17/23 1954 06/17/23 2028 06/18/23 0457  BP: 94/60 96/66  106/87  Pulse: 86 78  (!) 111  Resp: 17 18  18   Temp: (!) 97.3 F (36.3 C) 97.6 F (36.4 C)  97.9 F (36.6 C)  TempSrc: Oral Oral  Oral  SpO2: 100% (!) 51% 98%   Weight:      Height:        Intake/Output Summary (Last 24 hours) at 06/18/2023 1451 Last data filed at 06/18/2023 1100 Gross per 24 hour  Intake 236 ml  Output --  Net 236 ml   Filed Weights   05/28/23 0500 06/01/23 0500 06/02/23 0500  Weight: 42.6 kg  42.2 kg 42 kg    Examination:  Physical Exam Vitals and nursing note reviewed.  Constitutional:      Comments: Pt rolling around in bed. Unresponsive to verbal stimuli.  Cardiovascular:     Rate and Rhythm: Normal rate.  Pulmonary:     Effort: Pulmonary effort is normal.  Abdominal:     General: Abdomen is  flat.  Skin:    Capillary Refill: Capillary refill takes less than 2 seconds.  Neurological:     Comments: No responsive to verbal stimuli.    Scheduled Meds:  feeding supplement  237 mL Oral BID BM   LORazepam  0.5 mg Oral TID   Continuous Infusions:   LOS: 97 days   Time spent: 45 minutes  Carollee Herter, DO  [facility]  06/18/2023, 2:51 PM

## 2023-06-19 DIAGNOSIS — E43 Unspecified severe protein-calorie malnutrition: Secondary | ICD-10-CM

## 2023-06-19 NOTE — Plan of Care (Signed)
  Problem: Clinical Measurements: Goal: Ability to maintain clinical measurements within normal limits will improve Outcome: Progressing Goal: Will remain free from infection Outcome: Progressing Goal: Cardiovascular complication will be avoided Outcome: Progressing   Problem: Activity: Goal: Risk for activity intolerance will decrease Outcome: Progressing   Problem: Nutrition: Goal: Adequate nutrition will be maintained Outcome: Progressing   Problem: Coping: Goal: Level of anxiety will decrease Outcome: Progressing   Problem: Elimination: Goal: Will not experience complications related to bowel motility Outcome: Progressing Goal: Will not experience complications related to urinary retention Outcome: Progressing   Problem: Safety: Goal: Ability to remain free from injury will improve Outcome: Progressing   Problem: Skin Integrity: Goal: Risk for impaired skin integrity will decrease Outcome: Progressing   Problem: Safety: Goal: Non-violent Restraint(s) Outcome: Progressing

## 2023-06-19 NOTE — Progress Notes (Signed)
 PROGRESS NOTE    Natalie Brown  WGN:[id number] DOB: [birth year] DOA: 01/22/2023 PCP: Lavinia Sharps, NP  Subjective: Pt seen and examined. Remains on bed enclosure. Rolling around in bed. NT attempting to feed pt.    Hospital Course: HPI/brief hospital course:  Natalie Brown. Natalie Brown is 58 year old female, past medical history significant for bipolar disorder with catatonia, hypertension, dementia, B12 deficiency and severe protein calorie malnutrition. Patient was admitted with agitation.   Hospitalized between 9//2023 and 01/22/2023 for similar presentation. Brought back to ED on 01/22/2023 due to agitation. Referred for admission on 03/13/2023 for agitation.  Per MOST form after discussion with the family, patient was changed to comfort measure 03/13/23  Patient was seen by the neurology team on 03/17/2023 and the patient's presentation was felt to be likely related to progression of her underlying neurodegenerative process (manifesting with early onset dementia and other psychiatric disorders, as well). No further workup was recommended. Palliative care was consulted and patient was made comfort cares only.   Input from the neurology and psychiatric team is highly appreciated. 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   Psychiatric team recommended continuing lorazepam. In December 2024, patient was found not to be a candidate for residential hospice. Patient does not have any place for a locked memory care unit. Patient has remained in the hospital. Goal of care remains comfort care. Due to high fall risk with subsequent falls, an enclosure bed has been renewed every day for patient safety.   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  Recommend to continue lorazepam.  Patient was on Namenda on 04/02/2023, which was held on 04/28/2023 . Patient was on Zyprexa which was held by psychiatry  03/19/2024 . Goal of care remains comfort care, verified with palliative care as well as psychiatry between the week of 05/14/2023 - 05/20/2023.    Safety sitter needed due to patient's high fall risk.   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 Enclosure bed for safety - needs to be renewed daily. 06-19-2023 renewed enclosure bed restraints  Terminal care Has MOST form completed 12-19-2022. Comfort care. Pt is terminal care/comfort care. Lovenox stopped on 06-18-2023.     CKD stage 3a, GFR 45-59 ml/min (HCC) 06-18-2023 pt is terminal care. Stable. Not checking Scr. 06-19-2023 stable.  Protein-calorie malnutrition, severe 06-18-2023 Continue feeding supplement BID  06-19-2023 stable.  Essential hypertension 06-18-2023 stable. Not on any HTN meds. 06-19-2023 stable off any HTN meds.  DVT prophylaxis:   None because of comfort care   Code Status: Limited: Do not attempt resuscitation (DNR) -DNR-LIMITED -Do Not Intubate/DNI  Family Communication: no family at bedside Disposition Plan: SNF Reason for continuing need for hospitalization: medically stable to DC to SNF that will take her  Objective: Vitals:   06/17/23 1954 06/17/23 2028 06/18/23 0457 06/18/23 2151  BP: 96/66  106/87 103/61  Pulse: 78  (!) 111 83  Resp: 18  18 18   Temp: 97.6 F (36.4 C)  97.9 F (36.6 C) 98.1 F (36.7 C)  TempSrc: Oral  Oral Oral  SpO2: (!) 51% 98%  98%  Weight:      Height:        Intake/Output Summary (Last 24 hours) at 06/19/2023 1335 Last data filed at 06/19/2023 1043 Gross per 24 hour  Intake 874 ml  Output --  Net 874 ml   Ceasar Mons  Weights   05/28/23 0500 06/01/23 0500 06/02/23 0500  Weight: 42.6 kg 42.2 kg 42 kg    Examination:  Physical Exam Vitals and nursing note reviewed.  Constitutional:      Comments: Sleeping in bed enclosure  Pulmonary:     Effort: Pulmonary effort is normal. No  respiratory distress.     Breath sounds: Normal breath sounds.  Musculoskeletal:     Comments: Curled up in fetal position  Neurological:     Comments: Unresponsive to verbal stimuli    Scheduled Meds:  feeding supplement  237 mL Oral BID BM   LORazepam  0.5 mg Oral TID   Continuous Infusions:   LOS: 98 days   Time spent: 30 minutes  Carollee Herter, DO  [facility]  06/19/2023, 1:35 PM

## 2023-06-20 NOTE — Progress Notes (Signed)
 PROGRESS NOTE    Natalie Brown  ZOX:[id number] DOB: 16-May-1965 DOA: 01/22/2023 PCP: Lavinia Sharps, NP  Subjective: Pt seen and examined. Remains on bed enclosure. Pt sleeping.   Hospital Course: HPI/brief hospital course:  Natalie Brown. Natalie Brown is 58 year old female, past medical history significant for bipolar disorder with catatonia, hypertension, dementia, B12 deficiency and severe protein calorie malnutrition. Patient was admitted with agitation.   Hospitalized between 9//2023 and 01/22/2023 for similar presentation. Brought back to ED on 01/22/2023 due to agitation. Referred for admission on 03/13/2023 for agitation.  Per MOST form after discussion with the family, patient was changed to comfort measure 03/13/23  Patient was seen by the neurology team on 03/17/2023 and the patient's presentation was felt to be likely related to progression of her underlying neurodegenerative process (manifesting with early onset dementia and other psychiatric disorders, as well). No further workup was recommended. Palliative care was consulted and patient was made comfort cares only.   Input from the neurology and psychiatric team is highly appreciated. 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   Psychiatric team recommended continuing lorazepam. In December 2024, patient was found not to be a candidate for residential hospice. Patient does not have any place for a locked memory care unit. Patient has remained in the hospital. Goal of care remains comfort care. Due to high fall risk with subsequent falls, an enclosure bed has been renewed every day for patient safety.   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  Recommend to continue lorazepam.  Patient was on Namenda on 04/02/2023, which was held on 04/28/2023 . Patient was on Zyprexa which was held by psychiatry 03/19/2024 . Goal of care remains comfort  care, verified with palliative care as well as psychiatry between the week of 05/14/2023 - 05/20/2023.    Safety sitter needed due to patient's high fall risk.   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 Enclosure bed for safety - needs to be renewed daily. 06-19-2023 renewed enclosure bed restraints 06-20-2023 renewed bed enclosure restraints.  Terminal care Has MOST form completed 12-19-2022. Comfort care. Pt is terminal care/comfort care. Lovenox stopped on 06-18-2023.     CKD stage 3a, GFR 45-59 ml/min (HCC) 06-18-2023 pt is terminal care. Stable. Not checking Scr. 06-19-2023 stable. 06-20-2023 stable  Protein-calorie malnutrition, severe 06-18-2023 Continue feeding supplement BID  06-19-2023 stable. 06-20-2023 stable  Essential hypertension 06-18-2023 stable. Not on any HTN meds. 06-19-2023 stable off any HTN meds.  06-20-2023 stable  DVT prophylaxis:   None because of comfort care   Code Status: Limited: Do not attempt resuscitation (DNR) -DNR-LIMITED -Do Not Intubate/DNI  Family Communication: no family at bedside Disposition Plan: unknown Reason for continuing need for hospitalization: medically stable for DC  Objective: Vitals:   06/17/23 1954 06/17/23 2028 06/18/23 0457 06/18/23 2151  BP: 96/66  106/87 103/61  Pulse: 78  (!) 111 83  Resp: 18  18 18   Temp: 97.6 F (36.4 C)  97.9 F (36.6 C) 98.1 F (36.7 C)  TempSrc: Oral  Oral Oral  SpO2: (!) 51% 98%  98%  Weight:      Height:        Intake/Output Summary (Last 24 hours) at 06/20/2023 1355 Last data filed at 06/20/2023 0630 Gross per 24 hour  Intake 237 ml  Output 2 ml  Net 235 ml   [employer]  05/28/23 0500 06/01/23 0500 06/02/23 0500  Weight: 42.6 kg 42.2 kg 42 kg   Examination:  Physical Exam Vitals and nursing note reviewed.  Constitutional:      Comments: Not responsive to verbal stimuli Lying at foot of  bed enclosure. In fetal position.  Pulmonary:     Effort: No respiratory distress.  Neurological:     Comments: Unresponsive to verbal stimuli   Scheduled Meds:  feeding supplement  237 mL Oral BID BM   LORazepam  0.5 mg Oral TID   Continuous Infusions:   LOS: 99 days   Time spent: 35 minutes  Carollee Herter, DO  [facility]  06/20/2023, 1:55 PM

## 2023-06-21 NOTE — Plan of Care (Signed)
  Problem: Clinical Measurements: Goal: Ability to maintain clinical measurements within normal limits will improve Outcome: Progressing Goal: Will remain free from infection Outcome: Progressing Goal: Cardiovascular complication will be avoided Outcome: Progressing   Problem: Activity: Goal: Risk for activity intolerance will decrease Outcome: Progressing   Problem: Nutrition: Goal: Adequate nutrition will be maintained Outcome: Progressing   Problem: Coping: Goal: Level of anxiety will decrease Outcome: Progressing   Problem: Elimination: Goal: Will not experience complications related to bowel motility Outcome: Progressing Goal: Will not experience complications related to urinary retention Outcome: Progressing   Problem: Safety: Goal: Ability to remain free from injury will improve Outcome: Progressing   Problem: Skin Integrity: Goal: Risk for impaired skin integrity will decrease Outcome: Progressing   Problem: Safety: Goal: Non-violent Restraint(s) Outcome: Progressing

## 2023-06-21 NOTE — Progress Notes (Signed)
 PROGRESS NOTE    Natalie Brown  ZOX:[id number] DOB: 1966-02-27 DOA: 01/22/2023 PCP: Lavinia Sharps, NP  Subjective: Pt seen and examined. Remains on bed enclosure. Pt sleeping.    Hospital Course: HPI/brief hospital course:  Natalie Brown. Natalie Brown is 58 year old female, past medical history significant for bipolar disorder with catatonia, hypertension, dementia, B12 deficiency and severe protein calorie malnutrition. Patient was admitted with agitation.   Hospitalized between 9//2023 and 01/22/2023 for similar presentation. Brought back to ED on 01/22/2023 due to agitation. Referred for admission on 03/13/2023 for agitation.  Per MOST form after discussion with the family, patient was changed to comfort measure 03/13/23  Patient was seen by the neurology team on 03/17/2023 and the patient's presentation was felt to be likely related to progression of her underlying neurodegenerative process (manifesting with early onset dementia and other psychiatric disorders, as well). No further workup was recommended. Palliative care was consulted and patient was made comfort cares only.   Input from the neurology and psychiatric team is highly appreciated. 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   Psychiatric team recommended continuing lorazepam. In December 2024, patient was found not to be a candidate for residential hospice. Patient does not have any place for a locked memory care unit. Patient has remained in the hospital. Goal of care remains comfort care. Due to high fall risk with subsequent falls, an enclosure bed has been renewed every day for patient safety.   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  Recommend to continue lorazepam.  Patient was on Namenda on 04/02/2023, which was held on 04/28/2023 . Patient was on Zyprexa which was held by psychiatry 03/19/2024 . Goal of care remains comfort  care, verified with palliative care as well as psychiatry between the week of 05/14/2023 - 05/20/2023.    Safety sitter needed due to patient's high fall risk.   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 Enclosure bed for safety - needs to be renewed daily. 06-19-2023 renewed enclosure bed restraints 06-20-2023 renewed bed enclosure restraints.  06-21-2023 renewed bed enclosure restraints for 24 hours.  Terminal care Has MOST form completed 12-19-2022. Comfort care. Pt is terminal care/comfort care. Lovenox stopped on 06-18-2023.     CKD stage 3a, GFR 45-59 ml/min (HCC) 06-18-2023 pt is terminal care. Stable. Not checking Scr. 06-19-2023 stable. 06-20-2023 stable  06-21-2023 stable.  Protein-calorie malnutrition, severe 06-18-2023 Continue feeding supplement BID  06-19-2023 stable. 06-20-2023 stable  06-21-2023 stable.  Essential hypertension 06-18-2023 stable. Not on any HTN meds. 06-19-2023 stable off any HTN meds. 06-20-2023 stable  06-21-2023 stable. Not on any HTN meds.  DVT prophylaxis:   None because of comfort care   Code Status: Limited: Do not attempt resuscitation (DNR) -DNR-LIMITED -Do Not Intubate/DNI  Family Communication: no family at bedside Disposition Plan: unknown Reason for continuing need for hospitalization: medically stable for DC  Objective: Vitals:   06/18/23 2151 06/20/23 1613 06/20/23 2008 06/21/23 0632  BP: 103/61 (!) 82/70 109/82 (!) 91/52  Pulse: 83 62 63 84  Resp: 18 18 17 18   Temp: 98.1 F (36.7 C) 98.1 F (36.7 C) 98.7 F (37.1 C)   TempSrc: Oral Oral    SpO2: 98% 99% 100%   Weight:      Height:        Intake/Output Summary (Last 24 hours) at 06/21/2023 1433 Last data filed  at 06/21/2023 1417 Gross per 24 hour  Intake 507 ml  Output --  Net 507 ml   Filed Weights   05/28/23 0500 06/01/23 0500 06/02/23 0500  Weight: 42.6 kg 42.2 kg 42 kg     Examination:  Physical Exam Vitals and nursing note reviewed.  HENT:     Head: Normocephalic.  Pulmonary:     Effort: Pulmonary effort is normal. No respiratory distress.     Breath sounds: Normal breath sounds.  Neurological:     Comments: Unresponsive to verbal stimuli    Scheduled Meds:  feeding supplement  237 mL Oral BID BM   LORazepam  0.5 mg Oral TID   Continuous Infusions:   LOS: 100 days   Time spent: 30 minutes  Carollee Herter, DO  [facility]  06/21/2023, 2:33 PM

## 2023-06-22 NOTE — Plan of Care (Signed)
  Problem: Clinical Measurements: Goal: Ability to maintain clinical measurements within normal limits will improve Outcome: Progressing Goal: Will remain free from infection Outcome: Progressing Goal: Cardiovascular complication will be avoided Outcome: Progressing   Problem: Activity: Goal: Risk for activity intolerance will decrease Outcome: Progressing   Problem: Nutrition: Goal: Adequate nutrition will be maintained Outcome: Progressing   Problem: Coping: Goal: Level of anxiety will decrease Outcome: Progressing   Problem: Elimination: Goal: Will not experience complications related to bowel motility Outcome: Progressing Goal: Will not experience complications related to urinary retention Outcome: Progressing   Problem: Safety: Goal: Ability to remain free from injury will improve Outcome: Progressing   Problem: Skin Integrity: Goal: Risk for impaired skin integrity will decrease Outcome: Progressing   Problem: Safety: Goal: Non-violent Restraint(s) Outcome: Progressing

## 2023-06-22 NOTE — Plan of Care (Signed)
  Problem: Safety: Goal: Non-violent Restraint(s) Outcome: Progressing   Problem: Clinical Measurements: Goal: Will remain free from infection Outcome: Progressing   Problem: Nutrition: Goal: Adequate nutrition will be maintained Outcome: Progressing

## 2023-06-22 NOTE — Progress Notes (Signed)
 PROGRESS NOTE    SHELIAH FIORILLO  WUJ:[id number] DOB: 01-26-1966 DOA: 01/22/2023 PCP: Lavinia Sharps, NP  Subjective: Pt seen and examined. Remains on bed enclosure. Pt sleeping at foot of bed enclosure.   Hospital Course: HPI/brief hospital course:  [name]. Hill is 58 year old female, past medical history significant for bipolar disorder with catatonia, hypertension, dementia, B12 deficiency and severe protein calorie malnutrition. Patient was admitted with agitation.   Hospitalized between 9//2023 and 01/22/2023 for similar presentation. Brought back to ED on 01/22/2023 due to agitation. Referred for admission on 03/13/2023 for agitation.  Per MOST form after discussion with the family, patient was changed to comfort measure 03/13/23  Patient was seen by the neurology team on 03/17/2023 and the patient's presentation was felt to be likely related to progression of her underlying neurodegenerative process (manifesting with early onset dementia and other psychiatric disorders, as well). No further workup was recommended. Palliative care was consulted and patient was made comfort cares only.   Input from the neurology and psychiatric team is highly appreciated. 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   Psychiatric team recommended continuing lorazepam. In December 2024, patient was found not to be a candidate for residential hospice. Patient does not have any place for a locked memory care unit. Patient has remained in the hospital. Goal of care remains comfort care. Due to high fall risk with subsequent falls, an enclosure bed has been renewed every day for patient safety.   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  Recommend to continue lorazepam.  Patient was on Namenda on 04/02/2023, which was held on 04/28/2023 . Patient was on Zyprexa which was held by psychiatry 03/19/2024 . Goal of  care remains comfort care, verified with palliative care as well as psychiatry between the week of 05/14/2023 - 05/20/2023.    Safety sitter needed due to patient's high fall risk.   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 Enclosure bed for safety - needs to be renewed daily. 06-19-2023 renewed enclosure bed restraints 06-20-2023 renewed bed enclosure restraints. 06-21-2023 renewed bed enclosure restraints for 24 hours.  06-22-2023 renewed bed enclosure restraints for another 24 hours.  Terminal care Has MOST form completed 12-19-2022. Comfort care. Pt is terminal care/comfort care. Lovenox stopped on 06-18-2023.     CKD stage 3a, GFR 45-59 ml/min (HCC) 06-18-2023 pt is terminal care. Stable. Not checking Scr. 06-19-2023 stable. 06-20-2023 stable 06-21-2023 stable.  06-22-2023 stable. Chronic.  Protein-calorie malnutrition, severe 06-18-2023 Continue feeding supplement BID  06-19-2023 stable. 06-20-2023 stable 06-21-2023 stable.  06-22-2023 stable. Chronic.  Essential hypertension 06-18-2023 stable. Not on any HTN meds. 06-19-2023 stable off any HTN meds. 06-20-2023 stable 06-21-2023 stable. Not on any HTN meds.  06-22-2023 stable. Not on any BP meds   DVT prophylaxis:   None because of comfort care   Code Status: Limited: Do not attempt resuscitation (DNR) -DNR-LIMITED -Do Not Intubate/DNI  Family Communication: no family at bedside Disposition Plan: unknown Reason for continuing need for hospitalization: medically stable for DC.  Objective: Vitals:   06/21/23 0632 06/21/23 2103 06/21/23 2105 06/22/23 0353  BP: (!) 91/52 115/80  (!) 88/62  Pulse: 84 (!) 43 86 95  Resp: 18 17  18   Temp:    (!) 97.5 F (36.4 C)  TempSrc:    Oral  SpO2:   100% 93%  Weight:  Height:        Intake/Output Summary (Last 24 hours) at 06/22/2023 1239 Last data filed at 06/22/2023 1000 Gross per 24  hour  Intake 767 ml  Output --  Net 767 ml   Filed Weights   05/28/23 0500 06/01/23 0500 06/02/23 0500  Weight: 42.6 kg 42.2 kg 42 kg    Examination:  Physical Exam Vitals and nursing note reviewed.  Constitutional:      Comments: Unresponsive to voice Curled up in fetal position at foot of bed enclosure.  Pulmonary:     Effort: Pulmonary effort is normal. No respiratory distress.     Breath sounds: Normal breath sounds. No wheezing.   Scheduled Meds:  feeding supplement  237 mL Oral BID BM   LORazepam  0.5 mg Oral TID   Continuous Infusions:   LOS: 101 days   Time spent: 25 minutes  Carollee Herter, DO  [facility]  06/22/2023, 12:39 PM

## 2023-06-23 NOTE — Plan of Care (Signed)
  Problem: Safety: Goal: Non-violent Restraint(s) Outcome: Progressing   Problem: Clinical Measurements: Goal: Will remain free from infection Outcome: Progressing

## 2023-06-23 NOTE — Plan of Care (Signed)
  Problem: Clinical Measurements: Goal: Ability to maintain clinical measurements within normal limits will improve Outcome: Progressing Goal: Will remain free from infection Outcome: Progressing Goal: Cardiovascular complication will be avoided Outcome: Progressing   Problem: Activity: Goal: Risk for activity intolerance will decrease Outcome: Progressing   Problem: Nutrition: Goal: Adequate nutrition will be maintained Outcome: Progressing   Problem: Coping: Goal: Level of anxiety will decrease Outcome: Progressing   Problem: Elimination: Goal: Will not experience complications related to bowel motility Outcome: Progressing Goal: Will not experience complications related to urinary retention Outcome: Progressing   Problem: Safety: Goal: Ability to remain free from injury will improve Outcome: Progressing   Problem: Skin Integrity: Goal: Risk for impaired skin integrity will decrease Outcome: Progressing   Problem: Safety: Goal: Non-violent Restraint(s) Outcome: Progressing

## 2023-06-23 NOTE — Progress Notes (Signed)
 PROGRESS NOTE  Natalie Brown HCW:[id number] DOB: Apr 09, 1966 DOA: 01/22/2023 PCP: Lavinia Sharps, NP   LOS: 102 days   Brief narrative:  Natalie Brown. Brown is 58 year old female, past medical history significant of bipolar disorder with catatonia, hypertension, dementia, B12 deficiency and severe protein calorie malnutrition initially admitted to the hospital for agitation.  Patient was seen by neurology in the past as well and was thought to have neurodegenerative process.  Psychiatry team also followed the patient during hospitalization.  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  Recommend to continue lorazepam. Goal of care remains comfort care, verified with palliative care as well as psychiatry between the week of 05/14/2023 - 05/20/2023.  Safety sitter needed due to patient's high fall risk.   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  Currently on comfort care.   Terminal care Has MOST form completed 12-19-2022. Comfort care. Pt is terminal care/comfort care. Lovenox stopped on 06-18-2023.  CKD stage 3a, GFR 45-59 ml/min  Not checking blood work further.  Comfort care  Protein-calorie malnutrition, severe Continue nutritional supplement.  Essential hypertension Not on medications.    DVT prophylaxis: None   Disposition: Uncertain at this time.  Long length of stay.  TOC on board.  Status is: Inpatient Remains inpatient appropriate because: Comfort care.    Code Status:     Code  Status: Limited: Do not attempt resuscitation (DNR) -DNR-LIMITED -Do Not Intubate/DNI   Family Communication: None at bedside  Consultants: Neurology Palliative care Wound care Psychiatry  Procedures: None  Anti-infectives:  None  Anti-infectives (From admission, onward)    None        Subjective: Today, patient was seen and examined at bedside.  Patient appears to be comfortable lying in sleeping in bed.  Objective: Vitals:   06/23/23 0316 06/23/23 0849  BP: 90/60 97/68  Pulse: 78 72  Resp: 20 17  Temp: 98.5 F (36.9 C) 97.9 F (36.6 C)  SpO2: 100% (!) 89%    Intake/Output Summary (Last 24 hours) at 06/23/2023 1327 Last data filed at 06/23/2023 0550 Gross per 24 hour  Intake 360 ml  Output --  Net 360 ml   Filed Weights   05/28/23 0500 06/01/23 0500 06/02/23 0500  Weight: 42.6 kg 42.2 kg 42 kg   Body mass index is 14.95 kg/m.   Physical Exam: GENERAL: Patient somnolent,  HENT: No scleral pallor or icterus. Pupils equally reactive to light. Oral mucosa is moist NECK: is supple, no gross swelling noted. CHEST:  No crackles or wheezes.  Diminished breath sounds bilaterally. CVS: S1 and S2 heard, no murmur. Regular rate and rhythm.  ABDOMEN: Soft, non-tender, bowel sounds are present. EXTREMITIES: No edema. CNS: Somnolent SKIN: warm and dry without rashes.  Data Review: I have personally reviewed the following laboratory data and studies,  CBC: No results for input(s): "WBC", "NEUTROABS", "HGB", "HCT", "MCV", "PLT" in the last 168 hours. Basic Metabolic Panel: No results for input(s): "NA", "K", "CL", "CO2", "GLUCOSE", "BUN", "CREATININE", "CALCIUM", "MG", "PHOS" in the last  168 hours. Liver Function Tests: No results for input(s): "AST", "ALT", "ALKPHOS", "BILITOT", "PROT", "ALBUMIN" in the last 168 hours. No results for input(s): "LIPASE", "AMYLASE" in the last 168 hours. No results for input(s): "AMMONIA" in the last 168 hours. Cardiac  Enzymes: No results for input(s): "CKTOTAL", "CKMB", "CKMBINDEX", "TROPONINI" in the last 168 hours. BNP (last 3 results) Recent Labs    01/23/23 1239  BNP 9.4    ProBNP (last 3 results) No results for input(s): "PROBNP" in the last 8760 hours.  CBG: No results for input(s): "GLUCAP" in the last 168 hours. No results found for this or any previous visit (from the past 240 hours).   Studies: No results found.    Joycelyn Das, MD  [facility] 06/23/2023  If 7PM-7AM, please contact night-coverage

## 2023-06-24 NOTE — Progress Notes (Signed)
 PROGRESS NOTE  COLIN NORMENT ZOX:[id number] DOB: Jan 04, 1966 DOA: 01/22/2023 PCP: Lavinia Sharps, NP   LOS: 103 days   Brief narrative:  [name]. Eustache is 58 year old female, past medical history significant of bipolar disorder with catatonia, hypertension, dementia, B12 deficiency and severe protein calorie malnutrition initially admitted to the hospital for agitation.  Patient was seen by neurology in the past as well and was thought to have neurodegenerative process.  Psychiatry team also followed the patient during hospitalization.  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  Recommend to continue lorazepam. Goal of care remains comfort care, verified with palliative care as well as psychiatry between the week of 05/14/2023 - 05/20/2023.   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  Currently on comfort care.   Terminal care Has MOST form completed 12-19-2022. Comfort care. Pt is terminal care/comfort care. Lovenox stopped on 06-18-2023.  CKD stage 3a, GFR 45-59 ml/min  Not checking blood work further.  Comfort care  Protein-calorie malnutrition, severe Continue nutritional supplement.  Essential hypertension Not on medications.    DVT prophylaxis: None for comfort   Disposition: Uncertain at this time.  Long length of stay.  TOC on board.  Status is: Inpatient Remains inpatient appropriate because: Comfort care.    Code Status:     Code Status: Limited: Do not attempt resuscitation  (DNR) -DNR-LIMITED -Do Not Intubate/DNI   Family Communication: None at bedside  Consultants: Neurology Palliative care Wound care Psychiatry  Procedures: None  Anti-infectives:  None  Anti-infectives (From admission, onward)    None        Subjective: Today, patient was seen and examined at bedside.  Appears comfortable.  Lying in bed.  No interval complaints reported to me.  Objective: Vitals:   06/23/23 1942 06/24/23 0840  BP: (!) 91/57 (!) 89/46  Pulse:  76  Resp: 17 17  Temp: 97.8 F (36.6 C) (!) 97.5 F (36.4 C)  SpO2:      Intake/Output Summary (Last 24 hours) at 06/24/2023 1031 Last data filed at 06/24/2023 0932 Gross per 24 hour  Intake 0 ml  Output --  Net 0 ml   Filed Weights   05/28/23 0500 06/01/23 0500 06/02/23 0500  Weight: 42.6 kg 42.2 kg 42 kg   Body mass index is 14.95 kg/m.   Physical Exam: GENERAL: Patient somnolent, lying comfortably in bed HENT: No scleral pallor or icterus. Pupils equally reactive to light. Oral mucosa is moist NECK: is supple, no gross swelling noted. CHEST:  No crackles or wheezes.  Diminished breath sounds bilaterally. CVS: S1 and S2 heard, no murmur. Regular rate and rhythm.  ABDOMEN: Soft, non-tender, bowel sounds are present. EXTREMITIES: No edema. CNS: Somnolent SKIN: warm and dry without rashes.  Data Review: I have personally reviewed the following laboratory data and studies,  CBC: No results for input(s): "WBC", "NEUTROABS", "HGB", "HCT", "MCV", "PLT" in the last 168 hours. Basic Metabolic Panel: No results for input(s): "NA", "K", "CL", "CO2", "GLUCOSE", "BUN", "CREATININE", "CALCIUM", "MG", "PHOS" in the last  168 hours. Liver Function Tests: No results for input(s): "AST", "ALT", "ALKPHOS", "BILITOT", "PROT", "ALBUMIN" in the last 168 hours. No results for input(s): "LIPASE", "AMYLASE" in the last 168 hours. No results for input(s): "AMMONIA" in the last 168 hours. Cardiac Enzymes: No results  for input(s): "CKTOTAL", "CKMB", "CKMBINDEX", "TROPONINI" in the last 168 hours. BNP (last 3 results) Recent Labs    01/23/23 1239  BNP 9.4    ProBNP (last 3 results) No results for input(s): "PROBNP" in the last 8760 hours.  CBG: No results for input(s): "GLUCAP" in the last 168 hours. No results found for this or any previous visit (from the past 240 hours).   Studies: No results found.    Joycelyn Das, MD  [facility] 06/24/2023  If 7PM-7AM, please contact night-coverage

## 2023-06-24 NOTE — Progress Notes (Signed)
 Unable to complete admission documentation at this time dt patient AMS

## 2023-06-24 NOTE — Plan of Care (Signed)
  Problem: Safety: Goal: Non-violent Restraint(s) Outcome: Progressing   Problem: Clinical Measurements: Goal: Ability to maintain clinical measurements within normal limits will improve Outcome: Progressing   Problem: Clinical Measurements: Goal: Will remain free from infection Outcome: Progressing

## 2023-06-25 NOTE — Progress Notes (Signed)
 PROGRESS NOTE  Natalie Brown SWF:[id number] DOB: [birth year] DOA: 01/22/2023 PCP: Lavinia Sharps, NP   LOS: 104 days   Brief narrative:  Natalie Brown is 58 year old female, past medical history significant of bipolar disorder with catatonia, hypertension, dementia, B12 deficiency and severe protein calorie malnutrition initially admitted to the hospital for agitation.  Patient was seen by neurology in the past as well and was thought to have neurodegenerative process.  Psychiatry team also followed the patient during hospitalization.  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  Recommend to continue lorazepam. Goal of care remains comfort care, verified with palliative care as well as psychiatry between the week of 05/14/2023 - 05/20/2023.   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  Currently on comfort care.   Terminal care Has MOST form completed 12-19-2022. Comfort care. Pt is terminal care/comfort care. Lovenox stopped on 06-18-2023.  CKD stage 3a, GFR 45-59 ml/min  Not checking blood work further.  Comfort care  Protein-calorie malnutrition, severe Continue nutritional supplement.  Essential hypertension Not on medications.    DVT prophylaxis: None for comfort   Disposition: Uncertain at this time.  Long length of stay.  TOC on board.  Status is: Inpatient Remains inpatient appropriate because: Comfort care.    Code Status:     Code Status: Limited: Do not attempt resuscitation  (DNR) -DNR-LIMITED -Do Not Intubate/DNI   Family Communication: None at bedside  Consultants: Neurology Palliative care Wound care Psychiatry  Procedures: None  Anti-infectives:  None  Anti-infectives (From admission, onward)    None        Subjective: Today, patient was seen and examined at bedside.  No interval complaints reported.  Lying in bed curled up.  Objective: Vitals:   06/24/23 1938 06/25/23 0540  BP: 92/65 96/62  Pulse: 97 96  Resp: 16 16  Temp: 98 F (36.7 C) 98.4 F (36.9 C)  SpO2: 100%     Intake/Output Summary (Last 24 hours) at 06/25/2023 0912 Last data filed at 06/24/2023 1600 Gross per 24 hour  Intake 240 ml  Output --  Net 240 ml   Filed Weights   05/28/23 0500 06/01/23 0500 06/02/23 0500  Weight: 42.6 kg 42.2 kg 42 kg   Body mass index is 14.95 kg/m.   Physical Exam: GENERAL: Patient somnolent, lying comfortably in bed curled up HENT: No scleral pallor or icterus. Pupils equally reactive to light. Oral mucosa is moist NECK: is supple, no gross swelling noted. CHEST:  No crackles or wheezes.  Diminished breath sounds bilaterally. CVS: S1 and S2 heard, no murmur. Regular rate and rhythm.  ABDOMEN: Soft, non-tender, bowel sounds are present. EXTREMITIES: No edema. CNS: Somnolent SKIN: warm and dry without rashes.  Data Review: I have personally reviewed the following laboratory data and studies,  CBC: No results for input(s): "WBC", "NEUTROABS", "HGB", "HCT", "MCV", "PLT" in the last 168 hours. Basic Metabolic Panel: No results for input(s): "NA", "K", "CL", "CO2", "GLUCOSE", "BUN", "CREATININE", "CALCIUM", "MG", "PHOS" in the last 168 hours. Liver Function  Tests: No results for input(s): "AST", "ALT", "ALKPHOS", "BILITOT", "PROT", "ALBUMIN" in the last 168 hours. No results for input(s): "LIPASE", "AMYLASE" in the last 168 hours. No results for input(s): "AMMONIA" in the last 168 hours. Cardiac Enzymes: No results for  input(s): "CKTOTAL", "CKMB", "CKMBINDEX", "TROPONINI" in the last 168 hours. BNP (last 3 results) Recent Labs    01/23/23 1239  BNP 9.4    ProBNP (last 3 results) No results for input(s): "PROBNP" in the last 8760 hours.  CBG: No results for input(s): "GLUCAP" in the last 168 hours. No results found for this or any previous visit (from the past 240 hours).   Studies: No results found.    Joycelyn Das, MD  [facility] 06/25/2023  If 7PM-7AM, please contact night-coverage

## 2023-06-25 NOTE — Plan of Care (Signed)
  Problem: Clinical Measurements: Goal: Ability to maintain clinical measurements within normal limits will improve Outcome: Progressing Goal: Will remain free from infection Outcome: Progressing Goal: Cardiovascular complication will be avoided Outcome: Progressing   Problem: Elimination: Goal: Will not experience complications related to bowel motility Outcome: Progressing Goal: Will not experience complications related to urinary retention Outcome: Progressing   Problem: Safety: Goal: Ability to remain free from injury will improve Outcome: Progressing   Problem: Safety: Goal: Non-violent Restraint(s) Outcome: Progressing

## 2023-06-26 NOTE — Progress Notes (Signed)
 PROGRESS NOTE  NICLOE FRONTERA NFA:[id number] DOB: 02/18/1966 DOA: 01/22/2023 PCP: Lavinia Sharps, NP   LOS: 105 days   Brief narrative:  Natalie Brown. Natalie Brown is 58 year old female, past medical history significant of bipolar disorder with catatonia, hypertension, dementia, B12 deficiency and severe protein calorie malnutrition initially admitted to the hospital for agitation.  Patient was seen by neurology in the past as well and was thought to have neurodegenerative process.  Psychiatry team also followed the patient during hospitalization.  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  Recommend to continue lorazepam. Goal of care remains comfort care.  Assessment/Plan: Principal Problem:   Early onset Dementia with behavioral disturbance (HCC) Active Problems:   Terminal care   Essential hypertension   Protein-calorie malnutrition, severe   CKD stage 3a, GFR 45-59 ml/min (HCC)   * Early onset severe Dementia with behavioral disturbance  Continue Ativan and trazodone, restraints.  Currently on comfort care.   Terminal care Has MOST form completed 12-19-2022. Comfort care. Pt is terminal care/comfort care. Lovenox stopped on 06-18-2023.  CKD stage 3a, GFR 45-59 ml/min  Not checking blood work further.  Comfort care  Protein-calorie malnutrition, severe Continue nutritional supplement.  Essential hypertension Not on medications.    DVT prophylaxis: None for comfort   Disposition: Uncertain at this time.  Long length of stay.  TOC on board.  Status is: Inpatient Remains inpatient appropriate because: Comfort care.    Code Status:     Code Status: Limited: Do not attempt resuscitation (DNR) -DNR-LIMITED -Do Not Intubate/DNI   Family Communication: None at  bedside  Consultants: Neurology Palliative care Wound care Psychiatry  Procedures: None  Anti-infectives:  None  Anti-infectives (From admission, onward)    None        Subjective: Today, patient was seen and examined at bedside.  No interval complaints reported.  Lying in bed curled up, being fed   Objective: Vitals:   06/25/23 2113 06/26/23 0734  BP: (!) 127/99 103/67  Pulse: 72 60  Resp: 18 18  Temp: 98.4 F (36.9 C) 98 F (36.7 C)  SpO2:  100%   No intake or output data in the 24 hours ending 06/26/23 1246  Filed Weights   05/28/23 0500 06/01/23 0500 06/02/23 0500  Weight: 42.6 kg 42.2 kg 42 kg   Body mass index is 14.95 kg/m.   Physical Exam: GENERAL: Patient lying comfortably in bed, curled up HENT: No scleral pallor or icterus. Pupils equally reactive to light. Oral mucosa is moist NECK: is supple, no gross swelling noted. CHEST:  No crackles or wheezes.  Diminished breath sounds bilaterally. CVS: S1 and S2 heard, no murmur. Regular rate and rhythm.  ABDOMEN: Soft, non-tender, bowel sounds are present. EXTREMITIES: No edema. CNS: Somnolent SKIN: warm and dry without rashes.  Data Review: I have personally reviewed the following laboratory data and studies,  CBC: No results for input(s): "WBC", "NEUTROABS", "HGB", "HCT", "MCV", "PLT" in the last 168 hours. Basic Metabolic Panel: No results for input(s): "NA", "K", "CL", "CO2", "GLUCOSE", "BUN", "CREATININE", "CALCIUM", "MG", "PHOS" in the last 168 hours. Liver Function Tests: No results for input(s): "AST", "ALT", "ALKPHOS", "BILITOT", "PROT", "ALBUMIN" in the last 168 hours. No results for input(s): "LIPASE", "AMYLASE" in the last 168 hours. No results for input(s): "  AMMONIA" in the last 168 hours. Cardiac Enzymes: No results for input(s): "CKTOTAL", "CKMB", "CKMBINDEX", "TROPONINI" in the last 168 hours. BNP (last 3 results) Recent Labs    01/23/23 1239  BNP 9.4    ProBNP (last 3  results) No results for input(s): "PROBNP" in the last 8760 hours.  CBG: No results for input(s): "GLUCAP" in the last 168 hours. No results found for this or any previous visit (from the past 240 hours).   Studies: No results found.    Joycelyn Das, MD  [facility] 06/26/2023  If 7PM-7AM, please contact night-coverage

## 2023-06-27 NOTE — Progress Notes (Signed)
 PROGRESS NOTE  Natalie Brown ZOX:[id number] DOB: 05-30-1965 DOA: 01/22/2023 PCP: Lavinia Sharps, NP   LOS: 106 days   Brief narrative:  Natalie Brown. Natalie Brown is 58 year old female, past medical history significant of bipolar disorder with catatonia, hypertension, dementia, B12 deficiency and severe protein calorie malnutrition initially admitted to the hospital for agitation.  Patient was seen by neurology in the past as well and was thought to have neurodegenerative process.  Psychiatry team also followed the patient during hospitalization.  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  Recommend to continue lorazepam. Goal of care remains comfort care.  Assessment/Plan: Principal Problem:   Early onset Dementia with behavioral disturbance (HCC) Active Problems:   Terminal care   Essential hypertension   Protein-calorie malnutrition, severe   CKD stage 3a, GFR 45-59 ml/min (HCC)   * Early onset severe Dementia with behavioral disturbance  Continue Ativan and trazodone, restraints.  Currently on comfort care.   Terminal care Has MOST form completed 12-19-2022. Comfort care. Pt is terminal care/comfort care. Lovenox stopped on 06-18-2023.  CKD stage 3a, GFR 45-59 ml/min  Not checking blood work further.  Comfort care  Protein-calorie malnutrition, severe Continue nutritional supplement.  Essential hypertension Not on medications.    DVT prophylaxis: None for comfort   Disposition: Uncertain at this time.  Long length of stay.  TOC on board.  Status is: Inpatient Remains inpatient appropriate because: Comfort care.    Code Status:     Code Status: Limited: Do not attempt resuscitation (DNR) -DNR-LIMITED -Do Not Intubate/DNI   Family Communication: None at  bedside  Consultants: Neurology Palliative care Wound care Psychiatry  Procedures: None  Anti-infectives:  None  Anti-infectives (From admission, onward)    None      Subjective: Today, patient was seen and examined at bedside.  No interval complaints reported.  Objective: Vitals:   06/26/23 1530 06/26/23 2139  BP: 111/64 104/85  Pulse: 93 (!) 59  Resp: 19 18  Temp: (!) 97.4 F (36.3 C) 98.5 F (36.9 C)  SpO2: 99% 95%    Intake/Output Summary (Last 24 hours) at 06/27/2023 1023 Last data filed at 06/27/2023 0940 Gross per 24 hour  Intake 70 ml  Output --  Net 70 ml    Filed Weights   05/28/23 0500 06/01/23 0500 06/02/23 0500  Weight: 42.6 kg 42.2 kg 42 kg   Body mass index is 14.95 kg/m.   Physical Exam: GENERAL: Patient lying comfortably in bed, curled up HENT: No scleral pallor or icterus. Pupils equally reactive to light. Oral mucosa is moist NECK: is supple, no gross swelling noted. CHEST:  No crackles or wheezes.  Diminished breath sounds bilaterally. CVS: S1 and S2 heard, no murmur. Regular rate and rhythm.  ABDOMEN: Soft, non-tender, bowel sounds are present. EXTREMITIES: No edema. CNS: Somnolent SKIN: warm and dry without rashes.  Data Review: I have personally reviewed the following laboratory data and studies,  CBC: No results for input(s): "WBC", "NEUTROABS", "HGB", "HCT", "MCV", "PLT" in the last 168 hours. Basic Metabolic Panel: No results for input(s): "NA", "K", "CL", "CO2", "GLUCOSE", "BUN", "CREATININE", "CALCIUM", "MG", "PHOS" in the last 168 hours. Liver Function Tests: No results for input(s): "AST", "ALT", "ALKPHOS", "BILITOT", "PROT", "ALBUMIN" in the last 168 hours. No results for input(s): "LIPASE", "  AMYLASE" in the last 168 hours. No results for input(s): "AMMONIA" in the last 168 hours. Cardiac Enzymes: No results for input(s): "CKTOTAL", "CKMB", "CKMBINDEX", "TROPONINI" in the last 168 hours. BNP (last 3 results) Recent  Labs    01/23/23 1239  BNP 9.4    ProBNP (last 3 results) No results for input(s): "PROBNP" in the last 8760 hours.  CBG: No results for input(s): "GLUCAP" in the last 168 hours. No results found for this or any previous visit (from the past 240 hours).   Studies: No results found.    Joycelyn Das, MD  [facility] 06/27/2023  If 7PM-7AM, please contact night-coverage

## 2023-06-27 NOTE — Plan of Care (Signed)
  Problem: Clinical Measurements: Goal: Will remain free from infection Outcome: Progressing Goal: Cardiovascular complication will be avoided Outcome: Progressing   Problem: Activity: Goal: Risk for activity intolerance will decrease Outcome: Progressing   Problem: Nutrition: Goal: Adequate nutrition will be maintained Outcome: Progressing   Problem: Elimination: Goal: Will not experience complications related to bowel motility Outcome: Progressing Goal: Will not experience complications related to urinary retention Outcome: Progressing   Problem: Safety: Goal: Ability to remain free from injury will improve Outcome: Progressing   Problem: Skin Integrity: Goal: Risk for impaired skin integrity will decrease Outcome: Progressing

## 2023-06-28 NOTE — Progress Notes (Signed)
 PROGRESS NOTE  [name]:[id number] DOB: 10/03/1965 DOA: 01/22/2023 PCP: Lavinia Sharps, NP   LOS: 107 days   Brief narrative:  Natalie Brown. Natalie Brown is 58 year old female, past medical history significant of bipolar disorder with catatonia, hypertension, dementia, B12 deficiency and severe protein calorie malnutrition initially admitted to the hospital for agitation.  Patient was seen by neurology in the past as well and was thought to have neurodegenerative process.  Psychiatry team also followed the patient during hospitalization.  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  Recommend to continue lorazepam. Goal of care remains comfort care.  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  Currently on comfort care.   Terminal care Has MOST form completed 12-19-2022. Comfort care. Pt is terminal care/comfort care. Lovenox stopped on 06-18-2023.  CKD stage 3a, GFR 45-59 ml/min  Not checking blood work further.  Comfort care  Protein-calorie malnutrition, severe Continue nutritional supplement.  Essential hypertension Not on medications.    DVT prophylaxis: None for comfort   Disposition: Uncertain at this time.  Long length of stay.  TOC on board.  Status is: Inpatient Remains inpatient appropriate because: Comfort care.    Code Status:     Code Status: Limited: Do not attempt resuscitation (DNR) -DNR-LIMITED -Do Not Intubate/DNI   Family Communication: None at  bedside  Consultants: Neurology Palliative care Wound care Psychiatry  Procedures: None  Anti-infectives:  None  Anti-infectives (From admission, onward)    None      Subjective: Today, patient was seen and examined at bedside.  No interval complaints reported.  Objective: Vitals:   06/27/23 1356 06/27/23 2200  BP: 103/65 113/70  Pulse: (!) 57 67  Resp: 17 18  Temp: 97.6 F (36.4 C) 98.2 F (36.8 C)  SpO2: 100% 98%    Intake/Output Summary (Last 24 hours) at 06/28/2023 1130 Last data filed at 06/27/2023 1438 Gross per 24 hour  Intake 158 ml  Output --  Net 158 ml    Filed Weights   05/28/23 0500 06/01/23 0500 06/02/23 0500  Weight: 42.6 kg 42.2 kg 42 kg   Body mass index is 14.95 kg/m.   Physical Exam: GENERAL: Patient lying comfortably in bed, curled up, catatonic.  Noninteractive.   Data Review: I have personally reviewed the following laboratory data and studies,  CBC: No results for input(s): "WBC", "NEUTROABS", "HGB", "HCT", "MCV", "PLT" in the last 168 hours. Basic Metabolic Panel: No results for input(s): "NA", "K", "CL", "CO2", "GLUCOSE", "BUN", "CREATININE", "CALCIUM", "MG", "PHOS" in the last 168 hours. Liver Function Tests: No results for input(s): "AST", "ALT", "ALKPHOS", "BILITOT", "PROT", "ALBUMIN" in the last 168 hours. No results for input(s): "LIPASE", "AMYLASE" in the last 168 hours. No results for input(s): "AMMONIA" in the last 168 hours. Cardiac Enzymes: No results for input(s): "CKTOTAL", "CKMB", "CKMBINDEX", "TROPONINI" in the last 168 hours. BNP (last 3 results) Recent Labs    01/23/23 1239  BNP 9.4    ProBNP (last 3 results) No results for input(s): "PROBNP" in the last 8760  hours.  CBG: No results for input(s): "GLUCAP" in the last 168 hours. No results found for this or any previous visit (from the past 240 hours).   Studies: No results found.    Joycelyn Das, MD  [facility] 06/28/2023  If 7PM-7AM,  please contact night-coverage

## 2023-06-29 NOTE — Plan of Care (Signed)
  Problem: Clinical Measurements: Goal: Ability to maintain clinical measurements within normal limits will improve Outcome: Progressing Goal: Will remain free from infection Outcome: Progressing Goal: Cardiovascular complication will be avoided Outcome: Progressing   Problem: Activity: Goal: Risk for activity intolerance will decrease Outcome: Progressing   Problem: Nutrition: Goal: Adequate nutrition will be maintained Outcome: Progressing   Problem: Coping: Goal: Level of anxiety will decrease Outcome: Progressing   Problem: Safety: Goal: Non-violent Restraint(s) Outcome: Progressing

## 2023-06-29 NOTE — Progress Notes (Signed)
 PROGRESS NOTE  Natalie Brown:[id number] DOB: [birth year] DOA: 01/22/2023 PCP: Lavinia Sharps, NP   LOS: 108 days   Brief narrative:  Natalie Brown is 58 year old female, past medical history significant of bipolar disorder with catatonia, hypertension, dementia, B12 deficiency and severe protein calorie malnutrition initially admitted to the hospital for agitation.  Patient was seen by neurology in the past as well and was thought to have neurodegenerative process.  Psychiatry team also followed the patient during hospitalization.  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  Recommend to continue lorazepam. Goal of care remains comfort care.  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  Currently on comfort care.   Terminal care Has MOST form completed 12-19-2022. Comfort care. Pt is terminal care/comfort care. Lovenox stopped on 06-18-2023.  CKD stage 3a, GFR 45-59 ml/min  Not checking blood work further.  Comfort care  Protein-calorie malnutrition, severe Continue nutritional supplement.  Essential hypertension Not on medications.    DVT prophylaxis: None for comfort   Disposition: Uncertain at this time.  Long length of stay.  TOC on board.  Status is: Inpatient Remains inpatient appropriate because: Comfort care.    Code Status:     Code Status: Limited: Do not attempt resuscitation (DNR) -DNR-LIMITED -Do Not Intubate/DNI   Family Communication: None at  bedside  Consultants: Neurology Palliative care Wound care Psychiatry  Procedures: None  Anti-infectives:  None  Anti-infectives (From admission, onward)    None      Subjective: Today, patient was seen and examined at bedside.  No interval complaints reported.  Sleeping in bed curled up.  Objective: Vitals:   06/28/23 1518 06/28/23 2300  BP:  103/80  Pulse: 72 73  Resp: 17 18  Temp:  97.7 F (36.5 C)  SpO2:  100%    Intake/Output Summary (Last 24 hours) at 06/29/2023 1138 Last data filed at 06/29/2023 0600 Gross per 24 hour  Intake 30 ml  Output --  Net 30 ml    Filed Weights   05/28/23 0500 06/01/23 0500 06/02/23 0500  Weight: 42.6 kg 42.2 kg 42 kg   Body mass index is 14.95 kg/m.   Physical Exam: GENERAL: Patient lying comfortably in bed, curled up, catatonic.  Noninteractive.   Data Review: I have personally reviewed the following laboratory data and studies,  CBC: No results for input(s): "WBC", "NEUTROABS", "HGB", "HCT", "MCV", "PLT" in the last 168 hours. Basic Metabolic Panel: No results for input(s): "NA", "K", "CL", "CO2", "GLUCOSE", "BUN", "CREATININE", "CALCIUM", "MG", "PHOS" in the last 168 hours. Liver Function Tests: No results for input(s): "AST", "ALT", "ALKPHOS", "BILITOT", "PROT", "ALBUMIN" in the last 168 hours. No results for input(s): "LIPASE", "AMYLASE" in the last 168 hours. No results for input(s): "AMMONIA" in the last 168 hours. Cardiac Enzymes: No results for input(s): "CKTOTAL", "CKMB", "CKMBINDEX", "TROPONINI" in the last 168 hours. BNP (last 3 results) Recent Labs    01/23/23 1239  BNP 9.4    ProBNP (last 3 results) No results for input(s): "PROBNP" in the  last 8760 hours.  CBG: No results for input(s): "GLUCAP" in the last 168 hours. No results found for this or any previous visit (from the past 240 hours).   Studies: No results found.    Joycelyn Das, MD  [facility] 06/29/2023  If 7PM-7AM,  please contact night-coverage

## 2023-06-30 NOTE — Progress Notes (Signed)
 PROGRESS NOTE  Natalie Brown [id number]:[id number] DOB: Jun 26, 1965 DOA: 01/22/2023 PCP: Lavinia Sharps, NP   LOS: 109 days   Brief narrative:  Natalie Brown. Sherpa is 58 year old female, past medical history significant of bipolar disorder with catatonia, hypertension, dementia, B12 deficiency and severe protein calorie malnutrition initially admitted to the hospital for agitation.  Patient was seen by neurology in the past as well and was thought to have neurodegenerative process.  Psychiatry team also followed the patient during hospitalization.  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  Recommend to continue lorazepam. Goal of care remains comfort care.  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  Currently on comfort care.   Terminal care Has MOST form completed 12-19-2022. Comfort care. Pt is terminal care/comfort care. Lovenox stopped on 06-18-2023.  CKD stage 3a, GFR 45-59 ml/min  Not checking blood work further.  Comfort care  Protein-calorie malnutrition, severe Continue nutritional supplement.  Essential hypertension Not on medications.    DVT prophylaxis: None for comfort   Disposition: Uncertain at this time.  Long length of stay.  TOC on board.  Status is: Inpatient Remains inpatient appropriate because: Comfort care.    Code Status:     Code Status: Limited: Do not attempt resuscitation (DNR) -DNR-LIMITED -Do Not Intubate/DNI   Family Communication: None at  bedside  Consultants: Neurology Palliative care Wound care Psychiatry  Procedures: None  Anti-infectives:  None  Anti-infectives (From admission, onward)    None      Subjective: Today, patient was seen and examined at bedside.  No interval complaints reported.  Sleeping in bed, curled up  Objective: Vitals:   06/29/23 1637 06/30/23 0812  BP: (!) 86/50 (!) 91/55  Pulse: 81 62  Resp: 17 17  Temp:  (!) 97.3 F (36.3 C)  SpO2:  100%    Intake/Output Summary (Last 24 hours) at 06/30/2023 1155 Last data filed at 06/30/2023 0602 Gross per 24 hour  Intake 187 ml  Output --  Net 187 ml    Filed Weights   05/28/23 0500 06/01/23 0500 06/02/23 0500  Weight: 42.6 kg 42.2 kg 42 kg   Body mass index is 14.95 kg/m.   Physical Exam: GENERAL: Patient lying comfortably in bed, curled up, catatonic.  Noninteractive.   Data Review: I have personally reviewed the following laboratory data and studies,  CBC: No results for input(s): "WBC", "NEUTROABS", "HGB", "HCT", "MCV", "PLT" in the last 168 hours. Basic Metabolic Panel: No results for input(s): "NA", "K", "CL", "CO2", "GLUCOSE", "BUN", "CREATININE", "CALCIUM", "MG", "PHOS" in the last 168 hours. Liver Function Tests: No results for input(s): "AST", "ALT", "ALKPHOS", "BILITOT", "PROT", "ALBUMIN" in the last 168 hours. No results for input(s): "LIPASE", "AMYLASE" in the last 168 hours. No results for input(s): "AMMONIA" in the last 168 hours. Cardiac Enzymes: No results for input(s): "CKTOTAL", "CKMB", "CKMBINDEX", "TROPONINI" in the last 168 hours. BNP (last 3 results) Recent Labs    01/23/23 1239  BNP 9.4    ProBNP (last 3 results) No results for input(s): "  PROBNP" in the last 8760 hours.  CBG: No results for input(s): "GLUCAP" in the last 168 hours. No results found for this or any previous visit (from the past 240 hours).   Studies: No results found.    Joycelyn Das, MD  [facility] 06/30/2023  If 7PM-7AM, please contact night-coverage

## 2023-06-30 NOTE — Plan of Care (Signed)
  Problem: Clinical Measurements: Goal: Ability to maintain clinical measurements within normal limits will improve Outcome: Progressing Goal: Will remain free from infection Outcome: Progressing Goal: Cardiovascular complication will be avoided Outcome: Progressing   Problem: Activity: Goal: Risk for activity intolerance will decrease Outcome: Progressing   Problem: Nutrition: Goal: Adequate nutrition will be maintained Outcome: Progressing   Problem: Coping: Goal: Level of anxiety will decrease Outcome: Progressing   Problem: Elimination: Goal: Will not experience complications related to bowel motility Outcome: Progressing Goal: Will not experience complications related to urinary retention Outcome: Progressing   Problem: Safety: Goal: Ability to remain free from injury will improve Outcome: Progressing   Problem: Skin Integrity: Goal: Risk for impaired skin integrity will decrease Outcome: Progressing   Problem: Safety: Goal: Non-violent Restraint(s) Outcome: Progressing

## 2023-06-30 NOTE — Plan of Care (Signed)
 Patient lethargic this shift. Enclosure bed in place. Turns self in bed. Writer attempted multiple times to place new mepilex to right elbow, patient would not allow. No drainage noted to area. Safety precautions maintained.  Enclosure bed in place Problem: Clinical Measurements: Goal: Will remain free from infection Outcome: Progressing   Problem: Activity: Goal: Risk for activity intolerance will decrease Outcome: Progressing   Problem: Nutrition: Goal: Adequate nutrition will be maintained Outcome: Progressing   Problem: Safety: Goal: Non-violent Restraint(s) Outcome: Progressing

## 2023-07-01 MED ORDER — PNEUMOCOCCAL 20-VAL CONJ VACC 0.5 ML IM SUSY
0.5000 mL | PREFILLED_SYRINGE | INTRAMUSCULAR | Status: DC
  Filled 2023-07-01: qty 0.5

## 2023-07-01 NOTE — Progress Notes (Signed)
 PROGRESS NOTE  THORA SCHERMAN ZOX:[id number] DOB: 29-Aug-1965 DOA: 01/22/2023 PCP: Lavinia Sharps, NP   LOS: 110 days   Brief narrative:  [name]. Cuervo is 58 year old female, past medical history significant of bipolar disorder with catatonia, hypertension, dementia, B12 deficiency and severe protein calorie malnutrition initially admitted to the hospital for agitation.  Patient was seen by neurology in the past as well and was thought to have neurodegenerative process.  Psychiatry team also followed the patient during hospitalization.  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  Recommend to continue lorazepam. Goal of care remains comfort care.  Long length of stay patient.  TOC on board for disposition.  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  Currently on comfort care.   Terminal care Has MOST form completed 12-19-2022. Comfort care. Pt is terminal care/comfort care. Lovenox stopped on 06-18-2023.  CKD stage 3a, GFR 45-59 ml/min  Not checking blood work further.  Comfort care  Protein-calorie malnutrition, severe Continue nutritional supplement.  Essential hypertension Not on medications.    DVT prophylaxis: None for comfort   Disposition: Uncertain at this time.  Long length of stay.  TOC on board.  Status is: Inpatient Remains inpatient appropriate because: Comfort care.    Code Status:     Code Status: Limited: Do not attempt resuscitation (DNR) -DNR-LIMITED -Do Not  Intubate/DNI   Family Communication: None at bedside  Consultants: Neurology Palliative care Wound care Psychiatry  Procedures: None  Anti-infectives:  None  Anti-infectives (From admission, onward)    None      Subjective: Today, patient was seen and examined at bedside.  No interval complaints reported.  Curled up in bed.  Objective: Vitals:   06/30/23 0812 06/30/23 2118  BP: (!) 91/55 106/70  Pulse: 62 75  Resp: 17 18  Temp: (!) 97.3 F (36.3 C) 97.6 F (36.4 C)  SpO2: 100% 98%   No intake or output data in the 24 hours ending 07/01/23 0926   Filed Weights   05/28/23 0500 06/01/23 0500 06/02/23 0500  Weight: 42.6 kg 42.2 kg 42 kg   Body mass index is 14.95 kg/m.   Physical Exam: GENERAL: Patient lying comfortably in bed, curled up, catatonic.  Noninteractive.   Data Review: I have personally reviewed the following laboratory data and studies,  CBC: No results for input(s): "WBC", "NEUTROABS", "HGB", "HCT", "MCV", "PLT" in the last 168 hours. Basic Metabolic Panel: No results for input(s): "NA", "K", "CL", "CO2", "GLUCOSE", "BUN", "CREATININE", "CALCIUM", "MG", "PHOS" in the last 168 hours. Liver Function Tests: No results for input(s): "AST", "ALT", "ALKPHOS", "BILITOT", "PROT", "ALBUMIN" in the last 168 hours. No results for input(s): "LIPASE", "AMYLASE" in the last 168 hours. No results for input(s): "AMMONIA" in the last 168 hours. Cardiac Enzymes: No results for input(s): "CKTOTAL", "CKMB", "CKMBINDEX", "TROPONINI" in the last 168 hours. BNP (last 3 results) Recent Labs    01/23/23 1239  BNP 9.4    ProBNP (last 3 results) No results for input(s): "PROBNP" in the last 8760 hours.  CBG: No results for input(s): "GLUCAP" in the last 168 hours. No results found for this or any previous visit (from the past 240 hours).   Studies: No results found.    Joycelyn Das, MD  [facility] 07/01/2023  If 7PM-7AM, please contact  night-coverage

## 2023-07-01 NOTE — Plan of Care (Signed)
 Patient alert and restless today, nonverbal. Enclosure bed. Abrasion to right elbow, dressing changed. Two Bms today. Walked in hallway this shift. Safety precautions maintained.    Problem: Activity: Goal: Risk for activity intolerance will decrease Outcome: Progressing   Problem: Nutrition: Goal: Adequate nutrition will be maintained Outcome: Progressing   Problem: Safety: Goal: Non-violent Restraint(s) Outcome: Progressing

## 2023-07-01 NOTE — Plan of Care (Signed)
  Problem: Clinical Measurements: Goal: Ability to maintain clinical measurements within normal limits will improve Outcome: Progressing Goal: Will remain free from infection Outcome: Progressing Goal: Cardiovascular complication will be avoided Outcome: Progressing   Problem: Activity: Goal: Risk for activity intolerance will decrease Outcome: Progressing   Problem: Nutrition: Goal: Adequate nutrition will be maintained Outcome: Progressing   Problem: Coping: Goal: Level of anxiety will decrease Outcome: Progressing   Problem: Elimination: Goal: Will not experience complications related to bowel motility Outcome: Progressing Goal: Will not experience complications related to urinary retention Outcome: Progressing   Problem: Safety: Goal: Ability to remain free from injury will improve Outcome: Progressing   Problem: Skin Integrity: Goal: Risk for impaired skin integrity will decrease Outcome: Progressing   Problem: Safety: Goal: Non-violent Restraint(s) Outcome: Progressing

## 2023-07-02 NOTE — Progress Notes (Signed)
 This RN gave patient Ativan scheduled medicatrion, patient ate some of her breakfast. Report given off to [place], Charity fundraiser. Patient in no distress.

## 2023-07-02 NOTE — Progress Notes (Signed)
 This RN told Hotel manager, I am not checked off on the enclosure bed due to just starting. Rn stated it is just an [facility] video. This RN searched [facility] unable to find and other staff unable to find.   Day Charge RN notified of needing the instructor to come check me off. This RN does has prior experience with the bed just need the check of due to hospital policy of wanting to be checked off.

## 2023-07-02 NOTE — Progress Notes (Signed)
 PROGRESS NOTE  Natalie Brown:[id number] DOB: October 27, 1965 DOA: 01/22/2023 PCP: Lavinia Sharps, NP   LOS: 111 days   Brief narrative:  Natalie Brown is 58 year old female, past medical history significant of bipolar disorder with catatonia, hypertension, dementia, B12 deficiency and severe protein calorie malnutrition initially admitted to the hospital for agitation.  Patient was seen by neurology in the past as well and was thought to have neurodegenerative process.  Psychiatry team also followed the patient during hospitalization.  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  Recommend to continue lorazepam. Goal of care remains comfort care.  Long length of stay patient.  TOC on board for disposition.  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  Currently on comfort care.  Cannot be excepted by hospice   Terminal care Has MOST form completed 12-19-2022. Comfort care. Pt is terminal care/comfort care. Lovenox stopped on 06-18-2023.  CKD stage 3a, GFR 45-59 ml/min  Not checking blood work further.  Comfort care  Protein-calorie malnutrition, severe Continue nutritional supplement.  Essential hypertension Not on medications.    DVT prophylaxis: None for comfort   Disposition: Uncertain at this time.  Long length of stay.  TOC on board.  Status is: Inpatient Remains inpatient appropriate because: Comfort care.    Code Status:     Code Status: Limited: Do not attempt resuscitation  (DNR) -DNR-LIMITED -Do Not Intubate/DNI   Family Communication: None at bedside  Consultants: Neurology Palliative care Wound care Psychiatry  Procedures: None  Anti-infectives:  None  Anti-infectives (From admission, onward)    None      Subjective:  She remains relatively immobile-was able to eat probably about quarter of her breakfast and 30 for lunch She does not really arouse when I awaken her and seems sleepy I did not disturb  Objective: Vitals:   07/01/23 1741 07/01/23 2235  BP: (!) 98/58 98/62  Pulse: 76 87  Resp: 16 18  Temp: 97.7 F (36.5 C) 97.7 F (36.5 C)  SpO2:  100%    Intake/Output Summary (Last 24 hours) at 07/02/2023 1720 Last data filed at 07/02/2023 1435 Gross per 24 hour  Intake 287 ml  Output --  Net 287 ml     Filed Weights   05/28/23 0500 06/01/23 0500 06/02/23 0500  Weight: 42.6 kg 42.2 kg 42 kg   Body mass index is 14.95 kg/m.   Physical Exam:  On interactive no distress EOMI NCAT Chest clear Rest of exam deferred  Data Review: I have personally reviewed the following laboratory data and studies,  CBC: No results for input(s): "WBC", "NEUTROABS", "HGB", "HCT", "MCV", "PLT" in the last 168 hours. Basic Metabolic Panel: No results for input(s): "NA", "K", "CL", "CO2", "GLUCOSE", "BUN", "CREATININE", "CALCIUM", "MG", "PHOS" in the last 168 hours. Liver Function Tests: No results for input(s): "AST", "ALT", "ALKPHOS", "BILITOT", "PROT", "ALBUMIN" in the last 168 hours. No results for input(s): "LIPASE", "AMYLASE" in the last 168 hours. No results for input(s): "AMMONIA" in the last 168 hours. Cardiac Enzymes: No results  for input(s): "CKTOTAL", "CKMB", "CKMBINDEX", "TROPONINI" in the last 168 hours. BNP (last 3 results) Recent Labs    01/23/23 1239  BNP 9.4    ProBNP (last 3 results) No results for input(s): "PROBNP" in the last 8760 hours.  CBG: No results for input(s): "GLUCAP" in the last 168 hours. No  results found for this or any previous visit (from the past 240 hours).   Studies: No results found.    Rhetta Mura, MD  [facility] 07/02/2023  If 7PM-7AM, please contact night-coverage

## 2023-07-02 NOTE — Plan of Care (Signed)
  Problem: Clinical Measurements: Goal: Will remain free from infection Outcome: Progressing   Problem: Safety: Goal: Ability to remain free from injury will improve Outcome: Progressing   

## 2023-07-03 NOTE — Progress Notes (Signed)
 No overall changes to comfort condition Didn't eat much this am Sleepy  Will re-eval tomorrow  No charge  Pleas Koch, MD [facility] 10:32 AM

## 2023-07-03 NOTE — Plan of Care (Signed)
  Problem: Clinical Measurements: Goal: Will remain free from infection Outcome: Progressing   Problem: Activity: Goal: Risk for activity intolerance will decrease Outcome: Progressing   Problem: Safety: Goal: Ability to remain free from injury will improve Outcome: Progressing   Problem: Skin Integrity: Goal: Risk for impaired skin integrity will decrease Outcome: Progressing   Problem: Safety: Goal: Non-violent Restraint(s) Outcome: Progressing

## 2023-07-04 NOTE — Plan of Care (Signed)
  Problem: Clinical Measurements: Goal: Ability to maintain clinical measurements within normal limits will improve Outcome: Progressing Goal: Will remain free from infection Outcome: Progressing Goal: Cardiovascular complication will be avoided Outcome: Progressing   Problem: Activity: Goal: Risk for activity intolerance will decrease Outcome: Progressing   Problem: Nutrition: Goal: Adequate nutrition will be maintained Outcome: Progressing   Problem: Coping: Goal: Level of anxiety will decrease Outcome: Progressing   Problem: Elimination: Goal: Will not experience complications related to bowel motility Outcome: Progressing Goal: Will not experience complications related to urinary retention Outcome: Progressing   Problem: Safety: Goal: Ability to remain free from injury will improve Outcome: Progressing   Problem: Skin Integrity: Goal: Risk for impaired skin integrity will decrease Outcome: Progressing

## 2023-07-04 NOTE — Progress Notes (Signed)
 PROGRESS NOTE  Natalie Brown ZOX:[id number] DOB: 1965-10-13 DOA: 01/22/2023 PCP: Lavinia Sharps, NP   LOS: 113 days   Brief narrative:  Natalie Brown is 58 year old female, past medical history significant of bipolar disorder with catatonia, hypertension, dementia, B12 deficiency and severe protein calorie malnutrition initially admitted to the hospital for agitation.  Patient was seen by neurology in the past as well and was thought to have neurodegenerative process.  Psychiatry team also followed the patient during hospitalization.  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  Recommend to continue lorazepam. Goal of care remains comfort care.  Long length of stay patient.  TOC on board for disposition.  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  Currently on comfort care.  Cannot be accepted by hospice?   Terminal care Has MOST form completed 12-19-2022. Comfort care. Pt is terminal care/comfort care. Lovenox stopped on 06-18-2023.  CKD stage 3a, GFR 45-59 ml/min  Not checking blood work further.  Comfort care  Protein-calorie malnutrition, severe Continue nutritional supplement.  Essential hypertension Not on medications.    DVT prophylaxis: None for comfort   Disposition: Uncertain at this time.  Long length of stay.  TOC on board.  Status is: Inpatient Remains inpatient appropriate because: Comfort care.    Code Status:     Code Status: Limited: Do not  attempt resuscitation (DNR) -DNR-LIMITED -Do Not Intubate/DNI   Family Communication: None at bedside  Consultants: Neurology Palliative care Wound care Psychiatry  Procedures: None  Anti-infectives:  None  Anti-infectives (From admission, onward)    None      Subjective:  Has remained curled up for the past 3 visits No overt changes Not really awakening to discussion   Objective: Vitals:   07/02/23 2030 07/03/23 2022  BP: 96/63 (!) 149/111  Pulse: 71 (!) 114  Resp: 18 18  Temp:  99.1 F (37.3 C)  SpO2: 100% 100%    Intake/Output Summary (Last 24 hours) at 07/04/2023 1508 Last data filed at 07/04/2023 1030 Gross per 24 hour  Intake 708 ml  Output --  Net 708 ml     Filed Weights   06/01/23 0500 06/02/23 0500 07/02/23 1700  Weight: 42.2 kg 42 kg 42.2 kg   Body mass index is 15.01 kg/m.   Physical Exam:  uninteractive no distress EOMI NCAT Chest clear Rest of exam deferred  Data Review: I have personally reviewed the following laboratory data and studies,  CBC: No results for input(s): "WBC", "NEUTROABS", "HGB", "HCT", "MCV", "PLT" in the last 168 hours. Basic Metabolic Panel: No results for input(s): "NA", "K", "CL", "CO2", "GLUCOSE", "BUN", "CREATININE", "CALCIUM", "MG", "PHOS" in the last 168 hours. Liver Function Tests: No results for input(s): "AST", "ALT", "ALKPHOS", "BILITOT", "PROT", "ALBUMIN" in the last 168 hours. No results for input(s): "LIPASE", "AMYLASE" in the last 168 hours. No results for input(s): "AMMONIA" in the last 168 hours. Cardiac Enzymes: No results for input(s): "CKTOTAL", "CKMB", "CKMBINDEX", "TROPONINI" in the last 168 hours. BNP (last 3  results) Recent Labs    01/23/23 1239  BNP 9.4    ProBNP (last 3 results) No results for input(s): "PROBNP" in the last 8760 hours.  CBG: No results for input(s): "GLUCAP" in the last 168 hours. No results found for this or any previous visit (from the past 240 hours).    Studies: No results found.    Rhetta Mura, MD  [facility] 07/04/2023  If 7PM-7AM, please contact night-coverage

## 2023-07-04 NOTE — Plan of Care (Signed)
  Problem: Clinical Measurements: Goal: Will remain free from infection Outcome: Progressing   Problem: Activity: Goal: Risk for activity intolerance will decrease Outcome: Progressing   Problem: Safety: Goal: Non-violent Restraint(s) Outcome: Progressing

## 2023-07-05 NOTE — Plan of Care (Signed)

## 2023-07-05 NOTE — Plan of Care (Signed)
  Problem: Clinical Measurements: Goal: Ability to maintain clinical measurements within normal limits will improve Outcome: Completed/Met Goal: Will remain free from infection Outcome: Completed/Met Goal: Cardiovascular complication will be avoided Outcome: Completed/Met   Problem: Activity: Goal: Risk for activity intolerance will decrease Outcome: Completed/Met   Problem: Nutrition: Goal: Adequate nutrition will be maintained Outcome: Completed/Met   Problem: Coping: Goal: Level of anxiety will decrease Outcome: Completed/Met   Problem: Elimination: Goal: Will not experience complications related to bowel motility Outcome: Completed/Met Goal: Will not experience complications related to urinary retention Outcome: Completed/Met

## 2023-07-05 NOTE — Progress Notes (Signed)
 Seems a little agitated this morning moving around in her enclosure-has not touched breakfast No overall changes  Will re-eval tomorrow  No charge  Pleas Koch, MD [facility] 10:02 AM

## 2023-07-06 NOTE — Progress Notes (Signed)
 PROGRESS NOTE  Natalie Brown ZOX:[id number] DOB: October 18, 1965 DOA: 01/22/2023 PCP: Lavinia Sharps, NP   LOS: 115 days   Brief narrative:  Natalie Schlein. Brown is 58 year old female, past medical history significant of bipolar disorder with catatonia, hypertension, dementia, B12 deficiency and severe protein calorie malnutrition initially admitted to the hospital for agitation.  Patient was seen by neurology in the past as well and was thought to have neurodegenerative process.  Psychiatry team also followed the patient during hospitalization.  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  Recommend to continue lorazepam. Goal of care remains comfort care.  Long length of stay patient.  TOC on board for disposition.  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  Currently on comfort care.  Cannot be accepted by hospice? Will enquire from CSW when able   Terminal care Has MOST form completed 12-19-2022. Comfort care. Pt is terminal care/comfort care. Lovenox stopped on 06-18-2023.  CKD stage 3a, GFR 45-59 ml/min  Not checking blood work further.  Comfort care  Protein-calorie malnutrition, severe Continue nutritional supplement, eating mainly breakfast  Essential hypertension Not on medications.    DVT prophylaxis: None for comfort   Disposition: Uncertain at this time.  Long length of stay.  TOC on board.  Status is: Inpatient Remains inpatient appropriate because:  Comfort care.    Code Status:     Code Status: Limited: Do not attempt resuscitation (DNR) -DNR-LIMITED -Do Not Intubate/DNI   Family Communication: None at bedside  Consultants: Neurology Palliative care Wound care Psychiatry  Procedures: None  Anti-infectives:  None  Anti-infectives (From admission, onward)    None      Subjective:  Less agitated than yesterday. Laying in enclosure--no distress   Objective: Vitals:   07/05/23 0536 07/05/23 1939  BP: 113/66 120/86  Pulse: 63 (!) 105  Resp: 18 18  Temp: (!) 97.4 F (36.3 C) 98.6 F (37 C)  SpO2: 100%     Intake/Output Summary (Last 24 hours) at 07/06/2023 1631 Last data filed at 07/06/2023 1315 Gross per 24 hour  Intake 387 ml  Output --  Net 387 ml     Filed Weights   06/01/23 0500 06/02/23 0500 07/02/23 1700  Weight: 42.2 kg 42 kg 42.2 kg   Body mass index is 15.01 kg/m.   Physical Exam:  uninteractive no distress EOMI NCAT Chest clear Rest of exam deferred  Data Review: I have personally reviewed the following laboratory data and studies,  CBC: No results for input(s): "WBC", "NEUTROABS", "HGB", "HCT", "MCV", "PLT" in the last 168 hours. Basic Metabolic Panel: No results for input(s): "NA", "K", "CL", "CO2", "GLUCOSE", "BUN", "CREATININE", "CALCIUM", "MG", "PHOS" in the last 168 hours. Liver Function Tests: No results for input(s): "AST", "ALT", "ALKPHOS", "BILITOT", "PROT", "ALBUMIN" in the last 168 hours. No results for input(s): "LIPASE", "AMYLASE" in the last 168 hours. No results for input(s): "AMMONIA" in the last 168 hours. Cardiac Enzymes: No results for input(s): "CKTOTAL", "CKMB", "CKMBINDEX", "TROPONINI" in the last  168 hours. BNP (last 3 results) Recent Labs    01/23/23 1239  BNP 9.4    ProBNP (last 3 results) No results for input(s): "PROBNP" in the last 8760 hours.  CBG: No results for input(s): "GLUCAP" in the last 168 hours. No results found for this or any  previous visit (from the past 240 hours).   Studies: No results found.    Rhetta Mura, MD  [facility] 07/06/2023  If 7PM-7AM, please contact night-coverage

## 2023-07-06 NOTE — Plan of Care (Signed)
  Problem: Pain Management: Goal: Satisfaction with pain management regimen will improve Outcome: Not Progressing   Problem: Respiratory: Goal: Verbalizations of increased ease of respirations will increase Outcome: Not Progressing   Problem: Coping: Goal: Ability to identify and develop effective coping behavior will improve Outcome: Not Progressing

## 2023-07-07 NOTE — Progress Notes (Signed)
  No overall changes--1/2 of breakfast tolerated Remains in enclosure bed Discussed with TOC.  No options for transfer currently out of hospital  No charge  Pleas Koch, MD [facility] 2:10 PM

## 2023-07-07 NOTE — Plan of Care (Signed)
  Problem: Role Relationship: Goal: Family's ability to cope with current situation will improve 07/07/2023 0029 by Cottie Banda, Mardelle Matte, RN Outcome: Not Progressing 07/07/2023 0014 by Cottie Banda, Mardelle Matte, RN Outcome: Not Progressing   Problem: Safety: Goal: Non-violent Restraint(s) 07/07/2023 0029 by Cottie Banda, Mardelle Matte, RN Outcome: Not Progressing 07/07/2023 0014 by Cottie Banda, Mardelle Matte, RN Outcome: Not Progressing

## 2023-07-08 LAB — COMPREHENSIVE METABOLIC PANEL
ALT: 15 U/L (ref 0–44)
AST: 19 U/L (ref 15–41)
Albumin: 3.1 g/dL — ABNORMAL LOW (ref 3.5–5.0)
Alkaline Phosphatase: 46 U/L (ref 38–126)
Anion gap: 9 (ref 5–15)
BUN: 21 mg/dL — ABNORMAL HIGH (ref 6–20)
CO2: 25 mmol/L (ref 22–32)
Calcium: 8.9 mg/dL (ref 8.9–10.3)
Chloride: 108 mmol/L (ref 98–111)
Creatinine, Ser: 1.03 mg/dL — ABNORMAL HIGH (ref 0.44–1.00)
GFR, Estimated: >60 mL/min (ref >60)
Glucose, Bld: 73 mg/dL (ref 70–99)
Potassium: 4.1 mmol/L (ref 3.5–5.1)
Sodium: 142 mmol/L (ref 135–145)
Total Bilirubin: 0.5 mg/dL (ref 0.0–1.2)
Total Protein: 6.7 g/dL (ref 6.5–8.1)

## 2023-07-08 LAB — CBC WITH DIFFERENTIAL/PLATELET
Abs Immature Granulocytes: 0.02 10*3/uL (ref 0.00–0.07)
Basophils Absolute: 0.1 10*3/uL (ref 0.0–0.1)
Basophils Relative: 1 %
Eosinophils Absolute: 0.1 10*3/uL (ref 0.0–0.5)
Eosinophils Relative: 2 %
HCT: 39.9 % (ref 36.0–46.0)
Hemoglobin: 13.2 g/dL (ref 12.0–15.0)
Immature Granulocytes: 0 %
Lymphocytes Relative: 48 %
Lymphs Abs: 2.6 10*3/uL (ref 0.7–4.0)
MCH: 29.8 pg (ref 26.0–34.0)
MCHC: 33.1 g/dL (ref 30.0–36.0)
MCV: 90.1 fL (ref 80.0–100.0)
Monocytes Absolute: 0.4 10*3/uL (ref 0.1–1.0)
Monocytes Relative: 8 %
Neutro Abs: 2.2 10*3/uL (ref 1.7–7.7)
Neutrophils Relative %: 41 %
Platelets: 293 10*3/uL (ref 150–400)
RBC: 4.43 MIL/uL (ref 3.87–5.11)
RDW: 13.9 % (ref 11.5–15.5)
WBC: 5.4 10*3/uL (ref 4.0–10.5)
nRBC: 0 % (ref 0.0–0.2)

## 2023-07-08 NOTE — Plan of Care (Signed)
  Problem: Safety: Goal: Non-violent Restraint(s) Outcome: Progressing   Problem: Clinical Measurements: Goal: Quality of life will improve Outcome: Progressing   Problem: Respiratory: Goal: Verbalizations of increased ease of respirations will increase Outcome: Progressing   Problem: Role Relationship: Goal: Family's ability to cope with current situation will improve Outcome: Progressing   Problem: Pain Management: Goal: Satisfaction with pain management regimen will improve Outcome: Progressing

## 2023-07-08 NOTE — Progress Notes (Signed)
 PROGRESS NOTE  Natalie Brown VZD:[id number] DOB: 1965/09/08 DOA: 01/22/2023 PCP: Lavinia Sharps, NP   LOS: 117 days   Brief narrative:  Natalie Brown is 58 year old female, past medical history significant of bipolar disorder with catatonia, hypertension, dementia, B12 deficiency and severe protein calorie malnutrition initially admitted to the hospital for agitation.  Patient was seen by neurology in the past as well and was thought to have neurodegenerative process.  Psychiatry team also followed the patient during hospitalization.  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  Recommend to continue lorazepam. Goal of care remains comfort care.  Long length of stay patient.  TOC on board for disposition.  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  Currently on comfort care.  Cannot be accepted by hospice? CSW relays that no payor source and family cannot manage patient a thome. No dispo plans in place   Terminal care Has MOST form completed 12-19-2022. Comfort care. Pt is terminal care/comfort care. Lovenox stopped on 06-18-2023.  CKD stage 3a, GFR 45-59 ml/min  Not checking blood work further.  Comfort care  Protein-calorie malnutrition, severe Continue nutritional supplement, eating mainly breakfast  Essential hypertension Not on medications.    DVT prophylaxis: None for comfort   Disposition: Uncertain at this time.  Long length of stay.  TOC on  board.  Status is: Inpatient Remains inpatient appropriate because: Comfort care.    Code Status:     Code Status: Limited: Do not attempt resuscitation (DNR) -DNR-LIMITED -Do Not Intubate/DNI   Family Communication: None at bedside  Consultants: Neurology Palliative care Wound care Psychiatry  Procedures: None  Anti-infectives:  None  Anti-infectives (From admission, onward)    None      Subjective:  Lying in enclosure--no distress   Objective: Vitals:   07/07/23 1100 07/08/23 1045  BP: (!) 87/59 90/62  Pulse: 64 66  Resp: 16 18  Temp: (!) 96.8 F (36 C) (!) 97.4 F (36.3 C)  SpO2:     No intake or output data in the 24 hours ending 07/08/23 1457  Filed Weights   06/01/23 0500 06/02/23 0500 07/02/23 1700  Weight: 42.2 kg 42 kg 42.2 kg   Body mass index is 15.01 kg/m.   Physical Exam:  uninteractive no distress EOMI NCAT Chest clear Rest of exam deferred  Data Review: I have personally reviewed the following laboratory data and studies,  CBC: Recent Labs  Lab 07/08/23 0556  WBC 5.4  NEUTROABS 2.2  HGB 13.2  HCT 39.9  MCV 90.1  PLT 293   Basic Metabolic Panel: Recent Labs  Lab 07/08/23 0556  NA 142  K 4.1  CL 108  CO2 25  GLUCOSE 73  BUN 21*  CREATININE 1.03*  CALCIUM 8.9   Liver Function Tests: Recent Labs  Lab 07/08/23 0556  AST 19  ALT 15  ALKPHOS 46  BILITOT 0.5  PROT 6.7  ALBUMIN 3.1*   No results for input(s): "LIPASE", "AMYLASE" in the last 168 hours.  No results for input(s): "AMMONIA" in the last 168 hours. Cardiac Enzymes: No results for input(s): "CKTOTAL", "CKMB", "CKMBINDEX", "TROPONINI" in the last 168 hours. BNP (last 3 results) Recent Labs    01/23/23 1239  BNP 9.4    ProBNP (last 3 results) No results for input(s): "PROBNP" in the last 8760 hours.  CBG: No results for input(s): "GLUCAP" in the last 168 hours. No results found for this or any previous visit (from the past 240 hours).    Studies: No results found.    Rhetta Mura, MD  [facility] 07/08/2023  If 7PM-7AM, please contact night-coverage

## 2023-07-08 NOTE — Progress Notes (Signed)
 Partial bed bath, oral care, and pericare completed. New clothes on.

## 2023-07-09 DIAGNOSIS — E43 Unspecified severe protein-calorie malnutrition: Secondary | ICD-10-CM

## 2023-07-09 DIAGNOSIS — E8809 Other disorders of plasma-protein metabolism, not elsewhere classified: Secondary | ICD-10-CM

## 2023-07-09 DIAGNOSIS — N1831 Chronic kidney disease, stage 3a: Secondary | ICD-10-CM

## 2023-07-09 DIAGNOSIS — I10 Essential (primary) hypertension: Secondary | ICD-10-CM

## 2023-07-09 DIAGNOSIS — R627 Adult failure to thrive: Secondary | ICD-10-CM

## 2023-07-09 NOTE — Progress Notes (Signed)
 Eat about 75% of breakfast and tolerated well. Peri-care completed and pt is made comfortable in bed.

## 2023-07-09 NOTE — Progress Notes (Signed)
 PROGRESS NOTE  Natalie Brown [id number] DOB: [birth year] DOA: 01/22/2023 PCP: Lavinia Sharps, NP   LOS: 118 days   Brief narrative: Natalie Brown. Mones is 58 year old female, past medical history significant of bipolar disorder with catatonia, hypertension, dementia, B12 deficiency and severe protein calorie malnutrition initially admitted to the hospital for agitation.  Patient was seen by neurology in the past as well and was thought to have neurodegenerative process.  Psychiatry team also followed the patient during hospitalization.  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  Recommend to continue lorazepam. Goal of care remains comfort care.  Long length of stay patient.  TOC on board for disposition.  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  Currently on comfort care.  Cannot be accepted by hospice? CSW relays that no payor source and family cannot manage patient a thome. No dispo plans in place  Terminal care/full comfort care Has MOST form completed 12-19-2022. Comfort care. Pt is terminal care/comfort care. Lovenox stopped on 06-18-2023.  CKD stage 3a, GFR 45-59 ml/min  Not checking blood work further.  Comfort care  Protein-calorie malnutrition, severe Continue nutritional supplement, eating mainly breakfast  Essential hypertension Not on medications.    DVT prophylaxis: None for comfort   Disposition: Uncertain at this time.  Long length of stay.   TOC on board.  Status is: Inpatient Remains inpatient appropriate because: Comfort care.    Code Status:     Code Status: Limited: Do not attempt resuscitation (DNR) -DNR-LIMITED -Do Not Intubate/DNI   Family Communication: None at bedside  Consultants: Neurology Palliative care Wound care Psychiatry  Procedures: None  Anti-infectives:  None  Anti-infectives (From admission, onward)    None      Subjective: Sleeping.  No apparent distress.   Objective: Vitals:   07/08/23 2220 07/09/23 1048  BP:  (!) 93/59  Pulse:  72  Resp:    Temp:    SpO2: 100%    No intake or output data in the 24 hours ending 07/09/23 1352  Filed Weights   06/01/23 0500 06/02/23 0500 07/02/23 1700  Weight: 42.2 kg 42 kg 42.2 kg   Body mass index is 15.01 kg/m.   Physical Exam: GENERAL: No apparent distress.  Sleep. RESP:  No IWOB.  On room air. MSK/EXT:   Significant muscle mass and subcu fat loss. SKIN: no apparent skin lesion or wound NEURO: Sleeping. PSYCH: Calm.  No distress or agitation.  Data Review: I have personally reviewed the following laboratory data and studies,  CBC: Recent Labs  Lab 07/08/23 0556  WBC 5.4  NEUTROABS 2.2  HGB 13.2  HCT 39.9  MCV 90.1  PLT 293   Basic Metabolic Panel: Recent Labs  Lab 07/08/23 0556  NA 142  K 4.1  CL 108  CO2 25  GLUCOSE 73  BUN 21*  CREATININE 1.03*  CALCIUM 8.9   Liver Function Tests: Recent Labs  Lab 07/08/23 0556  AST 19  ALT 15  ALKPHOS 46  BILITOT  0.5  PROT 6.7  ALBUMIN 3.1*   No results for input(s): "LIPASE", "AMYLASE" in the last 168 hours. No results for input(s): "AMMONIA" in the last 168 hours. Cardiac Enzymes: No results for input(s): "CKTOTAL", "CKMB", "CKMBINDEX", "TROPONINI" in the last 168 hours. BNP (last 3 results) Recent Labs    01/23/23 1239  BNP 9.4    ProBNP (last 3 results) No results for input(s): "PROBNP" in the last 8760 hours.  CBG: No results for input(s):  "GLUCAP" in the last 168 hours. No results found for this or any previous visit (from the past 240 hours).   Studies: No results found.    Natalie Brown, Natalie Brown  [facility] 07/09/2023  If 7PM-7AM, please contact night-coverage

## 2023-07-10 NOTE — Plan of Care (Signed)
  Problem: Safety: Goal: Non-violent Restraint(s) Outcome: Progressing   Problem: Education: Goal: Knowledge of the prescribed therapeutic regimen will improve Outcome: Progressing   Problem: Coping: Goal: Ability to identify and develop effective coping behavior will improve Outcome: Progressing   Problem: Clinical Measurements: Goal: Quality of life will improve Outcome: Progressing   Problem: Respiratory: Goal: Verbalizations of increased ease of respirations will increase Outcome: Progressing   Problem: Role Relationship: Goal: Family's ability to cope with current situation will improve Outcome: Progressing Goal: Ability to verbalize concerns, feelings, and thoughts to partner or family member will improve Outcome: Progressing   Problem: Pain Management: Goal: Satisfaction with pain management regimen will improve Outcome: Progressing

## 2023-07-10 NOTE — Progress Notes (Signed)
 PROGRESS NOTE  Natalie Brown XBJ:[id number] DOB: Dec 01, 1965 DOA: 01/22/2023 PCP: Lavinia Sharps, NP   LOS: 119 days   Brief narrative: Natalie Schlein. Brown is 58 year old female, past medical history significant of bipolar disorder with catatonia, hypertension, dementia, B12 deficiency and severe protein calorie malnutrition initially admitted to the hospital for agitation.  Patient was seen by neurology in the past as well and was thought to have neurodegenerative process.  Psychiatry team also followed the patient during hospitalization.  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  Recommend to continue lorazepam. Goal of care remains comfort care.  Long length of stay patient.  TOC on board for disposition.  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  Currently on comfort care.  Cannot be accepted by hospice? CSW relays that no payor source and family cannot manage patient a thome. No dispo plans in place  Terminal care/full comfort care Has MOST form completed 12-19-2022. Comfort care. Pt is terminal care/comfort care. Lovenox stopped on 06-18-2023.  CKD stage 3a, GFR 45-59 ml/min  Not checking blood work further.  Comfort care  Protein-calorie malnutrition, severe Continue nutritional supplement, eating mainly breakfast  Essential hypertension Not on medications.    DVT prophylaxis: None for comfort   Disposition: Uncertain at this time.  Long length of stay.   TOC on board.  Status is: Inpatient Remains inpatient appropriate because: Comfort care.    Code Status:     Code Status: Limited: Do not attempt resuscitation (DNR) -DNR-LIMITED -Do Not Intubate/DNI   Family Communication: None at bedside  Consultants: Neurology Palliative care Wound care Psychiatry  Procedures: None  Anti-infectives:  None  Anti-infectives (From admission, onward)    None      Subjective: Sleeping.  No apparent distress.   Objective: Vitals:   07/09/23 1519 07/09/23 2111  BP: (!) 91/58 (!) 87/60  Pulse: 73 75  Resp:  18  Temp: 98.2 F (36.8 C) (!) 97.3 F (36.3 C)  SpO2:  100%    Intake/Output Summary (Last 24 hours) at 07/10/2023 1131 Last data filed at 07/09/2023 2213 Gross per 24 hour  Intake 120 ml  Output --  Net 120 ml    Filed Weights   06/01/23 0500 06/02/23 0500 07/02/23 1700  Weight: 42.2 kg 42 kg 42.2 kg   Body mass index is 15.01 kg/m.   Physical Exam: GENERAL: No apparent distress.  Sleep. RESP:  No IWOB.  On room air. MSK/EXT:   Significant muscle mass and subcu fat loss. SKIN: no apparent skin lesion or wound NEURO: Sleeping. PSYCH: Calm.  No distress or agitation.  Data Review: I have personally reviewed the following laboratory data and studies,  CBC: Recent Labs  Lab 07/08/23 0556  WBC 5.4  NEUTROABS 2.2  HGB 13.2  HCT 39.9  MCV 90.1  PLT 293   Basic Metabolic Panel: Recent Labs  Lab 07/08/23 0556  NA 142  K 4.1  CL 108  CO2 25  GLUCOSE 73  BUN 21*  CREATININE 1.03*  CALCIUM 8.9   Liver Function Tests: Recent Labs  Lab 07/08/23 0556  AST 19  ALT 15  ALKPHOS 46  BILITOT 0.5  PROT 6.7  ALBUMIN 3.1*   No results for input(s): "LIPASE", "AMYLASE" in the last 168 hours. No results for input(s): "AMMONIA" in the last 168 hours. Cardiac Enzymes: No results for input(s): "CKTOTAL", "CKMB", "CKMBINDEX", "TROPONINI" in the last 168 hours. BNP (last 3 results) Recent Labs     01/23/23 1239  BNP 9.4    ProBNP (last 3 results) No results for input(s): "PROBNP" in the last 8760 hours.  CBG: No results for input(s): "GLUCAP" in the last 168 hours. No results found for this or any previous visit (from the past 240 hours).   Studies: No results found.    Almon Hercules, MD  [facility] 07/10/2023  If 7PM-7AM, please contact night-coverage

## 2023-07-11 NOTE — Progress Notes (Signed)
 PROGRESS NOTE  Natalie Brown UJW:[id number] DOB: 1965/09/02 DOA: 01/22/2023 PCP: Lavinia Sharps, NP   LOS: 120 days   Brief narrative: Natalie Brown is 58 year old female, past medical history significant of bipolar disorder with catatonia, hypertension, dementia, B12 deficiency and severe protein calorie malnutrition initially admitted to the hospital for agitation.  Patient was seen by neurology in the past as well and was thought to have neurodegenerative process.  Psychiatry team also followed the patient during hospitalization.  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  Recommend to continue lorazepam. Goal of care remains comfort care.  Long length of stay patient.  TOC on board for disposition.  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  Currently on comfort care.  Cannot be accepted by hospice? CSW relays that no payor source and family cannot manage patient a thome. No dispo plans in place  Terminal care/full comfort care Has MOST form completed 12-19-2022. Comfort care. Pt is terminal care/comfort care. Lovenox stopped on 06-18-2023.  CKD stage 3a, GFR 45-59 ml/min  Not checking blood work further.  Comfort care  Protein-calorie malnutrition, severe Continue nutritional supplement, eating mainly breakfast  Essential hypertension Not on medications.    DVT prophylaxis: None for comfort   Disposition: Uncertain at this time.  Long length of stay.   TOC on board.  Status is: Inpatient Remains inpatient appropriate because: Comfort care.    Code Status:     Code Status: Limited: Do not attempt resuscitation (DNR) -DNR-LIMITED -Do Not Intubate/DNI   Family Communication: None at bedside  Consultants: Neurology Palliative care Wound care Psychiatry  Procedures: None  Anti-infectives:  None  Anti-infectives (From admission, onward)    None      Subjective: Sleeping.  No apparent distress.   Objective: Vitals:   07/10/23 2137 07/11/23 0755  BP: 107/78 91/64  Pulse: (!) 109 75  Resp: 18   Temp: 98 F (36.7 C) 97.6 F (36.4 C)  SpO2: 100%     Intake/Output Summary (Last 24 hours) at 07/11/2023 1248 Last data filed at 07/10/2023 2100 Gross per 24 hour  Intake 240 ml  Output --  Net 240 ml    Filed Weights   06/01/23 0500 06/02/23 0500 07/02/23 1700  Weight: 42.2 kg 42 kg 42.2 kg   Body mass index is 15.01 kg/m.   Physical Exam: GENERAL: No apparent distress.  Sleep. RESP:  No IWOB.  On room air. MSK/EXT:   Significant muscle mass and subcu fat loss. SKIN: no apparent skin lesion or wound NEURO: Sleeping. PSYCH: Calm.  No distress or agitation.  Data Review: I have personally reviewed the following laboratory data and studies,  CBC: Recent Labs  Lab 07/08/23 0556  WBC 5.4  NEUTROABS 2.2  HGB 13.2  HCT 39.9  MCV 90.1  PLT 293   Basic Metabolic Panel: Recent Labs  Lab 07/08/23 0556  NA 142  K 4.1  CL 108  CO2 25  GLUCOSE 73  BUN 21*  CREATININE  1.03*  CALCIUM 8.9   Liver Function Tests: Recent Labs  Lab 07/08/23 0556  AST 19  ALT 15  ALKPHOS 46  BILITOT 0.5  PROT 6.7  ALBUMIN 3.1*   No results for input(s): "LIPASE", "AMYLASE" in the last 168 hours. No results for input(s): "AMMONIA" in the last 168 hours. Cardiac Enzymes: No results for input(s): "CKTOTAL", "CKMB", "CKMBINDEX", "TROPONINI" in the last 168 hours. BNP (last 3 results) Recent Labs    01/23/23 1239   BNP 9.4    ProBNP (last 3 results) No results for input(s): "PROBNP" in the last 8760 hours.  CBG: No results for input(s): "GLUCAP" in the last 168 hours. No results found for this or any previous visit (from the past 240 hours).   Studies: No results found.    Almon Hercules, MD  [facility] 07/11/2023  If 7PM-7AM, please contact night-coverage

## 2023-07-12 NOTE — Plan of Care (Signed)
  Problem: Safety: Goal: Non-violent Restraint(s) Outcome: Progressing   Problem: Education: Goal: Knowledge of the prescribed therapeutic regimen will improve Outcome: Progressing   Problem: Coping: Goal: Ability to identify and develop effective coping behavior will improve Outcome: Progressing   Problem: Clinical Measurements: Goal: Quality of life will improve Outcome: Progressing   Problem: Respiratory: Goal: Verbalizations of increased ease of respirations will increase Outcome: Progressing   Problem: Role Relationship: Goal: Family's ability to cope with current situation will improve Outcome: Progressing Goal: Ability to verbalize concerns, feelings, and thoughts to partner or family member will improve Outcome: Progressing   Problem: Pain Management: Goal: Satisfaction with pain management regimen will improve Outcome: Progressing

## 2023-07-12 NOTE — Progress Notes (Signed)
 PROGRESS NOTE  Natalie [name] [id number]:[id number] DOB: 1965/07/27 DOA: 01/22/2023 PCP: Lavinia Sharps, NP   LOS: 121 days   Brief narrative: Natalie [name] is 58 year old [name], past medical history significant of bipolar disorder with catatonia, hypertension, dementia, B12 deficiency and severe protein calorie malnutrition initially admitted to the hospital for agitation.  Patient was seen by neurology in the past as well and was thought to have neurodegenerative process.  Psychiatry team also followed the patient during hospitalization.  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  Recommend to continue lorazepam. Goal of care remains comfort care.  Long length of stay patient.  TOC on board for disposition.  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  Currently on comfort care.  Cannot be accepted by hospice? CSW relays that no payor source and family cannot manage patient a thome. No dispo plans in place  Terminal care/full comfort care Has MOST form completed 12-19-2022. Comfort care. Pt is terminal care/comfort care. Lovenox stopped on 06-18-2023.  CKD stage 3a, GFR 45-59 ml/min  Not checking blood work further.  Comfort care  Protein-calorie malnutrition, severe Continue nutritional supplement, eating mainly breakfast  Essential hypertension Not on medications.    DVT prophylaxis: None for comfort   Disposition: Uncertain at this time.  Long length of stay.   TOC on board.  Status is: Inpatient Remains inpatient appropriate because: Comfort care.    Code Status:     Code Status: Limited: Do not attempt resuscitation (DNR) -DNR-LIMITED -Do Not Intubate/DNI   Family Communication: None at bedside  Consultants: Neurology Palliative care Wound care Psychiatry  Procedures: None  Anti-infectives:  None  Anti-infectives (From admission, onward)    None      Subjective: Sleeping.  No apparent distress.   Objective: Vitals:   07/11/23 0755 07/12/23 0522  BP: 91/64 (!) 86/63  Pulse: 75 61  Resp:  18  Temp: 97.6 F (36.4 C) 97.6 F (36.4 C)  SpO2:  100%    Intake/Output Summary (Last 24 hours) at 07/12/2023 1450 Last data filed at 07/12/2023 1249 Gross per 24 hour  Intake 1001 ml  Output --  Net 1001 ml    Filed Weights   06/01/23 0500 06/02/23 0500 07/02/23 1700  Weight: 42.2 kg 42 kg 42.2 kg   Body mass index is 15.01 kg/m.   Physical Exam: GENERAL: No apparent distress.  Sleep. RESP:  No IWOB.  On room air. MSK/EXT:   Significant muscle mass and subcu fat loss. SKIN: no apparent skin lesion or wound NEURO: Sleeping. PSYCH: Calm.  No distress or agitation.  Data Review: I have personally reviewed the following laboratory data and studies,  CBC: Recent Labs  Lab 07/08/23 0556  WBC 5.4  NEUTROABS 2.2  HGB 13.2  HCT 39.9  MCV 90.1  PLT 293   Basic Metabolic Panel: Recent Labs  Lab 07/08/23 0556  NA 142  K 4.1  CL 108  CO2 25  GLUCOSE 73  BUN 21*  CREATININE  1.03*  CALCIUM 8.9   Liver Function Tests: Recent Labs  Lab 07/08/23 0556  AST 19  ALT 15  ALKPHOS 46  BILITOT 0.5  PROT 6.7  ALBUMIN 3.1*   No results for input(s): "LIPASE", "AMYLASE" in the last 168 hours. No results for input(s): "AMMONIA" in the last 168 hours. Cardiac Enzymes: No results for input(s): "CKTOTAL", "CKMB", "CKMBINDEX", "TROPONINI" in the last 168 hours. BNP (last 3 results) Recent Labs     01/23/23 1239  BNP 9.4    ProBNP (last 3 results) No results for input(s): "PROBNP" in the last 8760 hours.  CBG: No results for input(s): "GLUCAP" in the last 168 hours. No results found for this or any previous visit (from the past 240 hours).   Studies: No results found.    Almon Hercules, MD  [facility] 07/12/2023  If 7PM-7AM, please contact night-coverage

## 2023-07-12 NOTE — Plan of Care (Signed)
  Problem: Safety: Goal: Non-violent Restraint(s) Outcome: Progressing   Problem: Education: Goal: Knowledge of the prescribed therapeutic regimen will improve Outcome: Progressing   Problem: Coping: Goal: Ability to identify and develop effective coping behavior will improve Outcome: Progressing   Problem: Clinical Measurements: Goal: Quality of life will improve Outcome: Progressing

## 2023-07-13 NOTE — Progress Notes (Signed)
 PROGRESS NOTE  Natalie Brown [id number]:[id number] DOB: 1965-08-22 DOA: 01/22/2023 PCP: Natalie Sharps, NP   LOS: 122 days   Brief narrative: Natalie Brown is 58 year old female, past medical history significant of bipolar disorder with catatonia, hypertension, dementia, B12 deficiency and severe protein calorie malnutrition initially admitted to the hospital for agitation.  Patient was seen by neurology in the past as well and was thought to have neurodegenerative process.  Psychiatry team also followed the patient during hospitalization.  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  Recommend to continue lorazepam. Goal of care remains comfort care.  Long length of stay patient.  TOC on board for disposition.  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  Currently on comfort care.  Cannot be accepted by hospice? CSW relays that no payor source and family cannot manage patient a thome. No dispo plans in place  Terminal care/full comfort care Has MOST form completed 12-19-2022. Comfort care. Pt is terminal care/comfort care. Lovenox stopped on 06-18-2023.  CKD stage 3a, GFR 45-59 ml/min  Not checking blood work further.  Comfort care  Protein-calorie malnutrition, severe Continue nutritional supplement, eating mainly breakfast  Essential hypertension Not on medications.    DVT prophylaxis: None for comfort   Disposition: Uncertain at this time.  Long length of stay.   TOC on board.  Status is: Inpatient Remains inpatient appropriate because: Comfort care.    Code Status:     Code Status: Limited: Do not attempt resuscitation (DNR) -DNR-LIMITED -Do Not Intubate/DNI   Family Communication: None at bedside  Consultants: Neurology Palliative care Wound care Psychiatry  Procedures: None  Anti-infectives:  None  Anti-infectives (From admission, onward)    None      Subjective: Sleeping.  No apparent distress.   Objective: Vitals:   07/13/23 1005 07/13/23 1026  BP: (!) (P) 75/56 90/60  Pulse: (P) 75   Resp: (P) 17   Temp:    SpO2:      Intake/Output Summary (Last 24 hours) at 07/13/2023 1433 Last data filed at 07/12/2023 1700 Gross per 24 hour  Intake 177 ml  Output --  Net 177 ml    Filed Weights   06/01/23 0500 06/02/23 0500 07/02/23 1700  Weight: 42.2 kg 42 kg 42.2 kg   Body mass index is 15.01 kg/m.   Physical Exam: GENERAL: No apparent distress.  Sleep. RESP:  No IWOB.  On room air. MSK/EXT:   Significant muscle mass and subcu fat loss. SKIN: no apparent skin lesion or wound NEURO: Sleeping. PSYCH: Calm.  No distress or agitation.  Data Review: I have personally reviewed the following laboratory data and studies,  CBC: Recent Labs  Lab 07/08/23 0556  WBC 5.4  NEUTROABS 2.2  HGB 13.2  HCT 39.9  MCV 90.1  PLT 293   Basic Metabolic Panel: Recent Labs  Lab 07/08/23 0556  NA 142  K 4.1  CL 108  CO2 25  GLUCOSE 73  BUN 21*  CREATININE 1.03*  CALCIUM  8.9   Liver Function Tests: Recent Labs  Lab 07/08/23 0556  AST 19  ALT 15  ALKPHOS 46  BILITOT 0.5  PROT 6.7  ALBUMIN 3.1*   No results for input(s): "LIPASE", "AMYLASE" in the last 168 hours. No results for input(s): "AMMONIA" in the last 168 hours. Cardiac Enzymes: No results for input(s): "CKTOTAL", "CKMB", "CKMBINDEX", "TROPONINI" in the last 168 hours. BNP (last 3 results) Recent Labs    01/23/23 1239  BNP 9.4    ProBNP (last  3 results) No results for input(s): "PROBNP" in the last 8760 hours.  CBG: No results for input(s): "GLUCAP" in the last 168 hours. No results found for this or any previous visit (from the past 240 hours).   Studies: No results found.    Almon Hercules, MD  [facility] 07/13/2023  If 7PM-7AM, please contact night-coverage

## 2023-07-14 NOTE — Progress Notes (Signed)
 PROGRESS NOTE  Natalie Brown [id number]:[id number] DOB: July 01, 1965 DOA: 01/22/2023 PCP: Lavinia Sharps, NP   LOS: 123 days   Brief narrative: Natalie Brown is 58 year old female, past medical history significant of bipolar disorder with catatonia, hypertension, dementia, B12 deficiency and severe protein calorie malnutrition initially admitted to the hospital for agitation.  Patient was seen by neurology in the past as well and was thought to have neurodegenerative process.  Psychiatry team also followed the patient during hospitalization.  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  Recommend to continue lorazepam. Goal of care remains comfort care.  Long length of stay patient.  TOC on board for disposition.  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  Currently on comfort care.  Cannot be accepted by hospice? CSW relays that no payor source and family cannot manage patient a thome. No dispo plans in place  Terminal care/full comfort care Has MOST form completed 12-19-2022. Comfort care. Pt is terminal care/comfort care. Lovenox stopped on 06-18-2023.  CKD stage 3a, GFR 45-59 ml/min  Not checking blood work further.  Comfort care  Protein-calorie malnutrition, severe Continue nutritional supplement, eating mainly breakfast  Essential hypertension Not on medications.    DVT prophylaxis: None for comfort  Disposition: Uncertain at this time.  Long length of stay.  TOC  on board.  Status is: Inpatient Remains inpatient appropriate because: Comfort care.    Code Status:     Code Status: Limited: Do not attempt resuscitation (DNR) -DNR-LIMITED -Do Not Intubate/DNI   Family Communication: None at bedside  Consultants: Neurology Palliative care Wound care Psychiatry  Procedures: None  Anti-infectives:  None  Anti-infectives (From admission, onward)    None      Subjective: Sleeping.  No apparent distress.   Objective: Vitals:   07/13/23 2230 07/14/23 0800  BP:  92/66  Pulse:  85  Resp:  20  Temp:  98.1 F (36.7 C)  SpO2: 100% 100%   No intake or output data in the 24 hours ending 07/14/23 0947   Filed Weights   06/01/23 0500 06/02/23 0500 07/02/23 1700  Weight: 42.2 kg 42 kg 42.2 kg   Body mass index is 15.01 kg/m.   Physical Exam: GENERAL: No apparent distress.  Sleep. RESP:  No IWOB.  On room air. MSK/EXT:   Significant muscle mass and subcu fat loss. SKIN: no apparent skin lesion or wound NEURO: Sleeping. PSYCH: Calm.  No distress or agitation.  Data Review: I have personally reviewed the following laboratory data and studies,  CBC: Recent Labs  Lab 07/08/23 0556  WBC 5.4  NEUTROABS 2.2  HGB 13.2  HCT 39.9  MCV 90.1  PLT 293   Basic Metabolic Panel: Recent Labs  Lab 07/08/23 0556  NA 142  K 4.1  CL 108  CO2 25  GLUCOSE 73  BUN 21*  CREATININE 1.03*  CALCIUM 8.9   Liver Function Tests: Recent Labs  Lab 07/08/23 0556  AST 19  ALT 15  ALKPHOS 46  BILITOT 0.5  PROT 6.7  ALBUMIN 3.1*   No results for input(s): "LIPASE", "AMYLASE" in the last 168 hours. No results for input(s): "AMMONIA" in the last 168 hours. Cardiac Enzymes: No results for input(s): "CKTOTAL", "CKMB", "CKMBINDEX", "TROPONINI" in the last 168 hours. BNP (last 3 results) Recent Labs    01/23/23 1239  BNP 9.4    ProBNP (last 3 results) No results for input(s): "PROBNP" in the last 8760 hours.  CBG: No results  for input(s): "GLUCAP" in the last 168 hours. No results found for this or any previous visit (from the past 240 hours).   Studies: No results found.    Almon Hercules, MD  [facility] 07/14/2023  If 7PM-7AM, please contact night-coverage

## 2023-07-14 NOTE — Plan of Care (Signed)
  Problem: Safety: Goal: Non-violent Restraint(s) Outcome: Not Progressing   Problem: Education: Goal: Knowledge of the prescribed therapeutic regimen will improve Outcome: Not Progressing   Problem: Coping: Goal: Ability to identify and develop effective coping behavior will improve Outcome: Not Progressing   Problem: Clinical Measurements: Goal: Quality of life will improve Outcome: Not Progressing   Problem: Respiratory: Goal: Verbalizations of increased ease of respirations will increase Outcome: Not Progressing   Problem: Role Relationship: Goal: Family's ability to cope with current situation will improve Outcome: Not Progressing Goal: Ability to verbalize concerns, feelings, and thoughts to partner or family member will improve Outcome: Not Progressing   Problem: Pain Management: Goal: Satisfaction with pain management regimen will improve Outcome: Not Progressing

## 2023-07-15 NOTE — Progress Notes (Signed)
 PROGRESS NOTE  [name] RUE:[id number] DOB: 07/15/1965 DOA: 01/22/2023 PCP: Lavinia Sharps, NP   LOS: 124 days   Brief narrative: Natalie Brown. Natalie Brown is 58 year old female, past medical history significant of bipolar disorder with catatonia, hypertension, dementia, B12 deficiency and severe protein calorie malnutrition initially admitted to the hospital for agitation.  Patient was seen by neurology in the past as well and was thought to have neurodegenerative process.  Psychiatry team also followed the patient during hospitalization.  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  Recommend to continue lorazepam. Goal of care remains comfort care.  Long length of stay patient.  TOC on board for disposition.  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  Currently on comfort care.  Cannot be accepted by hospice? CSW relays that no payor source and family cannot manage patient a thome. No dispo plans in place  Terminal care/full comfort care Has MOST form completed 12-19-2022. Comfort care. Pt is terminal care/comfort care. Lovenox stopped on 06-18-2023.  CKD stage 3a, GFR 45-59 ml/min  Not checking blood work further.  Comfort care  Protein-calorie malnutrition, severe Continue nutritional supplement, eating mainly breakfast  Essential hypertension Not on medications.    DVT prophylaxis: None for comfort  Disposition: Uncertain at this time.  Long length of stay.  TOC  on board.  Status is: Inpatient Remains inpatient appropriate because: Comfort care.    Code Status:     Code Status: Limited: Do not attempt resuscitation (DNR) -DNR-LIMITED -Do Not Intubate/DNI   Family Communication: None at bedside  Consultants: Neurology Palliative care Wound care Psychiatry  Procedures: None  Anti-infectives:  None  Anti-infectives (From admission, onward)    None      Subjective: Sleeping.  No apparent distress.   Objective: Vitals:   07/15/23 0500 07/15/23 0930  BP: 98/67 126/74  Pulse: 89 97  Resp: (!) 21 20  Temp: 98.7 F (37.1 C) 97.8 F (36.6 C)  SpO2: 100% 91%   No intake or output data in the 24 hours ending 07/15/23 1253   Filed Weights   06/01/23 0500 06/02/23 0500 07/02/23 1700  Weight: 42.2 kg 42 kg 42.2 kg   Body mass index is 15.01 kg/m.   Physical Exam: GENERAL: No apparent distress.  Sleep. RESP:  No IWOB.  On room air. MSK/EXT:   Significant muscle mass and subcu fat loss. SKIN: no apparent skin lesion or wound NEURO: Sleeping. PSYCH: Calm.  No distress or agitation.  Data Review: I have personally reviewed the following laboratory data and studies,  CBC: No results for input(s): "WBC", "NEUTROABS", "HGB", "HCT", "MCV", "PLT" in the last 168 hours.  Basic Metabolic Panel: No results for input(s): "NA", "K", "CL", "CO2", "GLUCOSE", "BUN", "CREATININE", "CALCIUM", "MG", "PHOS" in the last 168 hours.  Liver Function Tests: No results for input(s): "AST", "ALT", "ALKPHOS", "BILITOT", "PROT", "ALBUMIN" in the last 168 hours.  No results for input(s): "LIPASE", "AMYLASE" in the last 168 hours. No results for input(s): "AMMONIA" in  the last 168 hours. Cardiac Enzymes: No results for input(s): "CKTOTAL", "CKMB", "CKMBINDEX", "TROPONINI" in the last 168 hours. BNP (last 3 results) Recent Labs    01/23/23 1239  BNP 9.4    ProBNP (last 3 results) No results for input(s): "PROBNP" in the last 8760  hours.  CBG: No results for input(s): "GLUCAP" in the last 168 hours. No results found for this or any previous visit (from the past 240 hours).   Studies: No results found.    Almon Hercules, MD  [facility] 07/15/2023  If 7PM-7AM, please contact night-coverage

## 2023-07-15 NOTE — Plan of Care (Signed)
  Problem: Safety: Goal: Non-violent Restraint(s) Outcome: Progressing   

## 2023-07-15 NOTE — TOC Progression Note (Signed)
 Transition of Care [facility]) - Progression Note    Patient Details  Name: Natalie Brown MRN: [id number] Date of Birth: 03-05-1966  Transition of [facility]) CM/SW Contact  Sicily Zaragoza A [place], LCSW Phone Number: 07/15/2023, 4:27 PM  Clinical Narrative:      Pt has no bed offers. No potential facilities for placement review at this time.    TOC will continue to follow.       Expected Discharge Plan and Services                                               Social Determinants of Health (SDOH) Interventions SDOH Screenings   Food Insecurity: No Food Insecurity (05/14/2023)  Housing: Low Risk  (01/24/2023)  Transportation Needs: No Transportation Needs (05/14/2023)  Utilities: Not At Risk (05/14/2023)  Alcohol Screen: Low Risk  (09/06/2021)  Depression (PHQ2-9): Medium Risk (05/26/2020)  Tobacco Use: High Risk (04/04/2023)    Readmission Risk Interventions     No data to display

## 2023-07-16 NOTE — Plan of Care (Signed)
  Problem: Safety: Goal: Non-violent Restraint(s) Outcome: Progressing   

## 2023-07-16 NOTE — Progress Notes (Signed)
 PROGRESS NOTE  Natalie Brown WGN:[id number] DOB: [birth year] DOA: 01/22/2023 PCP: Lavinia Sharps, NP   LOS: 125 days   Brief narrative: Natalie Brown. Natalie Brown is 58 year old female, past medical history significant of bipolar disorder with catatonia, hypertension, dementia, B12 deficiency and severe protein calorie malnutrition initially admitted to the hospital for agitation.  Patient was seen by neurology in the past as well and was thought to have neurodegenerative process.  Psychiatry team also followed the patient during hospitalization.  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  Recommend to continue lorazepam. Goal of care remains comfort care.  Long length of stay patient.  TOC on board for disposition.  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  Currently on comfort care.  Cannot be accepted by hospice? CSW relays that no payor source and family cannot manage patient a thome. No dispo plans in place  Terminal care/full comfort care Has MOST form completed 12-19-2022. Comfort care. Pt is terminal care/comfort care. Lovenox stopped on 06-18-2023.  CKD stage 3a, GFR 45-59 ml/min  Not checking blood work further.  Comfort care  Protein-calorie malnutrition, severe Continue nutritional supplement, eating mainly breakfast  Essential hypertension Not on medications.    DVT prophylaxis: None for comfort  Disposition: Uncertain at this time.  Long length of stay.  TOC  on board.  Status is: Inpatient Remains inpatient appropriate because: Comfort care.    Code Status:     Code Status: Limited: Do not attempt resuscitation (DNR) -DNR-LIMITED -Do Not Intubate/DNI   Family Communication: None at bedside  Consultants: Neurology Palliative care Wound care Psychiatry  Procedures: None  Anti-infectives:  None  Anti-infectives (From admission, onward)    None      Subjective: Sleeping.  No apparent distress.   Objective: Vitals:   07/15/23 2100 07/16/23 0600  BP: 120/76 (!) 123/99  Pulse: 88 (!) 103  Resp: 19   Temp: 98.9 F (37.2 C)   SpO2:     No intake or output data in the 24 hours ending 07/16/23 1118   Filed Weights   06/01/23 0500 06/02/23 0500 07/02/23 1700  Weight: 42.2 kg 42 kg 42.2 kg   Body mass index is 15.01 kg/m.   Physical Exam: GENERAL: No apparent distress.  Sleep. RESP:  No IWOB.  On room air. MSK/EXT:   Significant muscle mass and subcu fat loss. SKIN: no apparent skin lesion or wound NEURO: Sleeping. PSYCH: Calm.  No distress or agitation.  Data Review: I have personally reviewed the following laboratory data and studies,  CBC: No results for input(s): "WBC", "NEUTROABS", "HGB", "HCT", "MCV", "PLT" in the last 168 hours.  Basic Metabolic Panel: No results for input(s): "NA", "K", "CL", "CO2", "GLUCOSE", "BUN", "CREATININE", "CALCIUM", "MG", "PHOS" in the last 168 hours.  Liver Function Tests: No results for input(s): "AST", "ALT", "ALKPHOS", "BILITOT", "PROT", "ALBUMIN" in the last 168 hours.  No results for input(s): "LIPASE", "AMYLASE" in the last 168 hours. No results for input(s): "AMMONIA" in the last  168 hours. Cardiac Enzymes: No results for input(s): "CKTOTAL", "CKMB", "CKMBINDEX", "TROPONINI" in the last 168 hours. BNP (last 3 results) Recent Labs    01/23/23 1239  BNP 9.4    ProBNP (last 3 results) No results for input(s): "PROBNP" in the last 8760 hours.  CBG: No results  for input(s): "GLUCAP" in the last 168 hours. No results found for this or any previous visit (from the past 240 hours).   Studies: No results found.    Almon Hercules, MD  [facility] 07/16/2023  If 7PM-7AM, please contact night-coverage

## 2023-07-17 NOTE — Plan of Care (Signed)
  Problem: Safety: Goal: Non-violent Restraint(s) Outcome: Progressing   

## 2023-07-17 NOTE — Progress Notes (Signed)
 PROGRESS NOTE  [name]:[id number] DOB: 1965/11/28 DOA: 01/22/2023 PCP: Lavinia Sharps, NP   LOS: 126 days   Brief narrative: Natalie Brown. Natalie Brown is 58 year old female, past medical history significant of bipolar disorder with catatonia, hypertension, dementia, B12 deficiency and severe protein calorie malnutrition initially admitted to the hospital for agitation.  Patient was seen by neurology in the past as well and was thought to have neurodegenerative process.  Psychiatry team also followed the patient during hospitalization.  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  Recommend to continue lorazepam. Goal of care remains comfort care.  Long length of stay patient.  TOC on board for disposition.  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  Currently on comfort care.  Cannot be accepted by hospice? CSW relays that no payor source and family cannot manage patient a thome. No dispo plans in place  Terminal care/full comfort care Has MOST form completed 12-19-2022. Comfort care. Pt is terminal care/comfort care. Lovenox stopped on 06-18-2023.  CKD stage 3a, GFR 45-59 ml/min  Not checking blood work further.  Comfort care  Protein-calorie malnutrition, severe Continue nutritional supplement, eating mainly breakfast  Essential hypertension Not on medications.    DVT prophylaxis: None for comfort  Disposition: Uncertain at this time.  Long length of stay.  TOC  on board.  Status is: Inpatient Remains inpatient appropriate because: Comfort care.    Code Status:     Code Status: Limited: Do not attempt resuscitation (DNR) -DNR-LIMITED -Do Not Intubate/DNI   Family Communication: None at bedside  Consultants: Neurology Palliative care Wound care Psychiatry  Procedures: None  Anti-infectives:  None  Anti-infectives (From admission, onward)    None      Subjective: Awake and restless.  Trying to get out of restraint tent.  Staff trying to feed   Objective: Vitals:   07/16/23 0600 07/16/23 2117  BP: (!) 123/99 109/71  Pulse: (!) 103   Resp:  18  Temp:  (!) 97.4 F (36.3 C)  SpO2:  100%    Intake/Output Summary (Last 24 hours) at 07/17/2023 1439 Last data filed at 07/17/2023 1010 Gross per 24 hour  Intake 236 ml  Output --  Net 236 ml     Filed Weights   06/01/23 0500 06/02/23 0500 07/02/23 1700  Weight: 42.2 kg 42 kg 42.2 kg   Body mass index is 15.01 kg/m.   Physical Exam: GENERAL: No apparent distress.   RESP:  No IWOB.  On room air. MSK/EXT:   Significant muscle mass and subcu fat loss. SKIN: no apparent skin lesion or wound NEURO: Very restless. PSYCH: Restless.  Trying to climb out of tent  Data Review: I have personally reviewed the following laboratory data and studies,  CBC: No results for input(s): "WBC", "NEUTROABS", "HGB", "HCT", "MCV", "PLT" in the last 168 hours.  Basic Metabolic Panel: No results for input(s): "NA", "K", "CL", "CO2", "GLUCOSE", "BUN", "CREATININE", "CALCIUM", "MG", "PHOS" in the last 168 hours.  Liver Function Tests:  No results for input(s): "AST", "ALT", "ALKPHOS", "BILITOT", "PROT", "ALBUMIN" in the last 168 hours.  No results for input(s): "LIPASE", "AMYLASE" in the last 168 hours. No results for input(s): "AMMONIA" in the last 168 hours. Cardiac Enzymes: No results for input(s): "CKTOTAL", "CKMB", "CKMBINDEX", "TROPONINI" in the last 168 hours. BNP (last 3  results) Recent Labs    01/23/23 1239  BNP 9.4    ProBNP (last 3 results) No results for input(s): "PROBNP" in the last 8760 hours.  CBG: No results for input(s): "GLUCAP" in the last 168 hours. No results found for this or any previous visit (from the past 240 hours).   Studies: No results found.    Almon Hercules, MD  [facility] 07/17/2023  If 7PM-7AM, please contact night-coverage

## 2023-07-18 NOTE — Progress Notes (Signed)
 PROGRESS NOTE  BERYL HORNBERGER ZOX:[id number] DOB: 1965-05-30 DOA: 01/22/2023 PCP: Lavinia Sharps, NP   LOS: 127 days   Brief narrative: [name]. Bobby is 58 year old female, past medical history significant of bipolar disorder with catatonia, hypertension, dementia, B12 deficiency and severe protein calorie malnutrition initially admitted to the hospital for agitation.  Patient was seen by neurology in the past as well and was thought to have neurodegenerative process.  Psychiatry team also followed the patient during hospitalization.  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  Recommend to continue lorazepam. Goal of care remains comfort care.  Long length of stay patient.  TOC on board for disposition.  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  Currently on comfort care.  Cannot be accepted by hospice? CSW relays that no payor source and family cannot manage patient a thome. No dispo plans in place  Terminal care/full comfort care Has MOST form completed 12-19-2022. Comfort care. Pt is terminal care/comfort care. Lovenox stopped on 06-18-2023.  CKD stage 3a, GFR 45-59 ml/min  Not checking blood work further.  Comfort care  Protein-calorie malnutrition, severe Continue nutritional supplement, eating mainly breakfast  Essential hypertension Not on medications.    DVT prophylaxis: None for comfort  Disposition: Uncertain at this time.  Long length of stay.  TOC  on board.  Status is: Inpatient Remains inpatient appropriate because: Comfort care.    Code Status:     Code Status: Limited: Do not attempt resuscitation (DNR) -DNR-LIMITED -Do Not Intubate/DNI   Family Communication: None at bedside  Consultants: Neurology Palliative care Wound care Psychiatry  Procedures: None  Anti-infectives:  None  Anti-infectives (From admission, onward)    None      Subjective: Sleeping.  No apparent distress.   Objective: Vitals:   07/16/23 2117 07/17/23 2114  BP: 109/71 106/68  Pulse:  67  Resp: 18 18  Temp: (!) 97.4 F (36.3 C) 98.3 F (36.8 C)  SpO2: 100% 100%    Intake/Output Summary (Last 24 hours) at 07/18/2023 1151 Last data filed at 07/18/2023 0959 Gross per 24 hour  Intake 978 ml  Output --  Net 978 ml     Filed Weights   06/02/23 0500 07/02/23 1700 07/17/23 1531  Weight: 42 kg 42.2 kg 37.4 kg   Body mass index is 13.32 kg/m.   Physical Exam: GENERAL: No apparent distress.  Sleep. RESP:  No IWOB.  On room air. MSK/EXT:   Significant muscle mass and subcu fat loss. SKIN: no apparent skin lesion or wound NEURO: Sleeping. PSYCH: Calm.  No distress or agitation.  Data Review: I have personally reviewed the following laboratory data and studies,  CBC: No results for input(s): "WBC", "NEUTROABS", "HGB", "HCT", "MCV", "PLT" in the last 168 hours.  Basic Metabolic Panel: No results for input(s): "NA", "K", "CL", "CO2", "GLUCOSE", "BUN", "CREATININE", "CALCIUM", "MG", "PHOS" in the last 168 hours.  Liver Function Tests: No results for input(s): "AST", "ALT", "ALKPHOS", "BILITOT", "PROT", "ALBUMIN" in the last  168 hours.  No results for input(s): "LIPASE", "AMYLASE" in the last 168 hours. No results for input(s): "AMMONIA" in the last 168 hours. Cardiac Enzymes: No results for input(s): "CKTOTAL", "CKMB", "CKMBINDEX", "TROPONINI" in the last 168 hours. BNP (last 3 results) Recent Labs    01/23/23 1239   BNP 9.4    ProBNP (last 3 results) No results for input(s): "PROBNP" in the last 8760 hours.  CBG: No results for input(s): "GLUCAP" in the last 168 hours. No results found for this or any previous visit (from the past 240 hours).   Studies: No results found.    Almon Hercules, MD  [facility] 07/18/2023  If 7PM-7AM, please contact night-coverage

## 2023-07-18 NOTE — Progress Notes (Signed)
 Palliative Care Plan of Care Note  Palliative care has been intermittently following patient for needs since admission. Patient is difficult to place given high level of custodial care needs including safety enclosure bed. [name] comfort care for progressive neurodegenerative disease. She has had a very slow progressive decline. Her medications have been stable except for minor adjustments to ativan. [name] hospice appropriate once a discharge facility is located- needs LTC bed or Memory Care bed with hospice. No payor source-unsure if she has applied for or has medicaid for medical care.Unclear terminal trajectory.Needs are mainly disposition related. Will sign off at this time-if there are questions or concerns please re-consult or reach out to me directly via secure chart.  Anderson Malta, DO Palliative Medicine

## 2023-07-18 NOTE — Plan of Care (Signed)
  Problem: Clinical Measurements: Goal: Quality of life will improve Outcome: Progressing   Problem: Role Relationship: Goal: Family's ability to cope with current situation will improve Outcome: Progressing

## 2023-07-19 NOTE — Progress Notes (Signed)
 PROGRESS NOTE  Natalie Brown:[id number] DOB: 1965/12/02 DOA: 01/22/2023 PCP: Lavinia Sharps, NP   LOS: 128 days   Brief narrative: Natalie Brown. Natalie Brown is 58 year old female, past medical history significant of bipolar disorder with catatonia, hypertension, dementia, B12 deficiency and severe protein calorie malnutrition initially admitted to the hospital for agitation.  Patient was seen by neurology in the past as well and was thought to have neurodegenerative process.  Psychiatry team also followed the patient during hospitalization.  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  Recommend to continue lorazepam. Goal of care remains comfort care.  Long length of stay patient.  TOC on board for disposition.  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  Currently on comfort care.  Cannot be accepted by hospice? CSW relays that no payor source and family cannot manage patient a thome. No dispo plans in place  Terminal care/full comfort care Has MOST form completed 12-19-2022. Comfort care. Pt is terminal care/comfort care. Lovenox stopped on 06-18-2023.  CKD stage 3a, GFR 45-59 ml/min  Not checking blood work further.  Comfort care  Protein-calorie malnutrition, severe Continue nutritional supplement, eating mainly breakfast  Essential hypertension Not on medications.    DVT prophylaxis: None for comfort  Disposition: Uncertain at this time.  Long length of stay.  TOC  on board.  Status is: Inpatient Remains inpatient appropriate because: Comfort care.    Code Status:     Code Status: Limited: Do not attempt resuscitation (DNR) -DNR-LIMITED -Do Not Intubate/DNI   Family Communication: None at bedside  Consultants: Neurology Palliative care Wound care Psychiatry  Procedures: None  Anti-infectives:  None  Anti-infectives (From admission, onward)    None      Subjective: Sleeping.  No apparent distress.   Objective: Vitals:   07/17/23 2114 07/18/23 2044  BP: 106/68 (!) 88/68  Pulse: 67 67  Resp: 18 20  Temp: 98.3 F (36.8 C) (!) 97.5 F (36.4 C)  SpO2: 100% 100%    Intake/Output Summary (Last 24 hours) at 07/19/2023 1229 Last data filed at 07/18/2023 1454 Gross per 24 hour  Intake 177 ml  Output --  Net 177 ml     Filed Weights   06/02/23 0500 07/02/23 1700 07/17/23 1531  Weight: 42 kg 42.2 kg 37.4 kg   Body mass index is 13.32 kg/m.   Physical Exam: GENERAL: No apparent distress.  Sleep. RESP:  No IWOB.  On room air. MSK/EXT:   Significant muscle mass and subcu fat loss. SKIN: no apparent skin lesion or wound NEURO: Sleeping. PSYCH: Calm.  No distress or agitation.  Data Review: I have personally reviewed the following laboratory data and studies,  CBC: No results for input(s): "WBC", "NEUTROABS", "HGB", "HCT", "MCV", "PLT" in the last 168 hours.  Basic Metabolic Panel: No results for input(s): "NA", "K", "CL", "CO2", "GLUCOSE", "BUN", "CREATININE", "CALCIUM", "MG", "PHOS" in the last 168 hours.  Liver Function Tests: No results for input(s): "AST", "ALT", "ALKPHOS", "BILITOT", "PROT", "ALBUMIN" in the  last 168 hours.  No results for input(s): "LIPASE", "AMYLASE" in the last 168 hours. No results for input(s): "AMMONIA" in the last 168 hours. Cardiac Enzymes: No results for input(s): "CKTOTAL", "CKMB", "CKMBINDEX", "TROPONINI" in the last 168 hours. BNP (last 3 results) Recent Labs    01/23/23 1239   BNP 9.4    ProBNP (last 3 results) No results for input(s): "PROBNP" in the last 8760 hours.  CBG: No results for input(s): "GLUCAP" in the last 168 hours. No results found for this or any previous visit (from the past 240 hours).   Studies: No results found.    Almon Hercules, MD  [facility] 07/19/2023  If 7PM-7AM, please contact night-coverage

## 2023-07-19 NOTE — Plan of Care (Signed)
  Problem: Safety: Goal: Non-violent Restraint(s) Outcome: Progressing   Problem: Education: Goal: Knowledge of the prescribed therapeutic regimen will improve Outcome: Progressing   Problem: Coping: Goal: Ability to identify and develop effective coping behavior will improve Outcome: Progressing   Problem: Clinical Measurements: Goal: Quality of life will improve Outcome: Progressing   Problem: Respiratory: Goal: Verbalizations of increased ease of respirations will increase Outcome: Progressing   Problem: Role Relationship: Goal: Family's ability to cope with current situation will improve Outcome: Progressing Goal: Ability to verbalize concerns, feelings, and thoughts to partner or family member will improve Outcome: Progressing   Problem: Pain Management: Goal: Satisfaction with pain management regimen will improve Outcome: Progressing

## 2023-07-20 MED ORDER — ALBUMIN HUMAN 25 % IV SOLN: 25.0000 g | INTRAVENOUS | Status: AC

## 2023-07-20 MED ORDER — SODIUM CHLORIDE 0.9 % IV BOLUS: 1000.0000 mL | INTRAVENOUS | Status: AC

## 2023-07-20 MED ORDER — MIDODRINE HCL 5 MG PO TABS
10.0000 mg | ORAL_TABLET | ORAL | Status: AC
  Administered 2023-07-21: 10 mg via ORAL
  Filled 2023-07-20: qty 2

## 2023-07-20 NOTE — Progress Notes (Addendum)
  RN reported that patient blood pressure is soft 85/65 and heart rate is 57.  Patient scheduled for nighttime lorazepam 0.5 mg. Planning to hold it until blood pressure improved.  Giving 1 L of NS bolus, albumin and 10 mg of midodrine. After finishing the IV fluid monitor the blood pressure improvement specifically systolic above 90 then can give the scheduled Ativan.  Tereasa Coop, MD [facility] 07/20/2023, 10:15 PM

## 2023-07-20 NOTE — Plan of Care (Signed)
  Problem: Safety: Goal: Non-violent Restraint(s) Outcome: Progressing   Problem: Education: Goal: Knowledge of the prescribed therapeutic regimen will improve Outcome: Progressing   Problem: Coping: Goal: Ability to identify and develop effective coping behavior will improve Outcome: Progressing   Problem: Clinical Measurements: Goal: Quality of life will improve Outcome: Progressing   Problem: Respiratory: Goal: Verbalizations of increased ease of respirations will increase Outcome: Progressing   Problem: Role Relationship: Goal: Family's ability to cope with current situation will improve Outcome: Progressing Goal: Ability to verbalize concerns, feelings, and thoughts to partner or family member will improve Outcome: Progressing   Problem: Pain Management: Goal: Satisfaction with pain management regimen will improve Outcome: Progressing

## 2023-07-20 NOTE — Progress Notes (Signed)
 PROGRESS NOTE  IOLA TURRI YNW:[id number] DOB: 1965/08/11 DOA: 01/22/2023 PCP: Lavinia Sharps, NP   LOS: 129 days   Brief narrative: [name]. Sotero is 58 year old female, past medical history significant of bipolar disorder with catatonia, hypertension, dementia, B12 deficiency and severe protein calorie malnutrition initially admitted to the hospital for agitation.  Patient was seen by neurology in the past as well and was thought to have neurodegenerative process.  Psychiatry team also followed the patient during hospitalization.  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  Recommend to continue lorazepam. Goal of care remains comfort care.  Long length of stay patient.  TOC on board for disposition.  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  Currently on comfort care.  Cannot be accepted by hospice? CSW relays that no payor source and family cannot manage patient a thome. No dispo plans in place  Terminal care/full comfort care Has MOST form completed 12-19-2022. Comfort care. Pt is terminal care/comfort care. Lovenox stopped on 06-18-2023.  CKD stage 3a, GFR 45-59 ml/min  Not checking blood work further.  Comfort care  Protein-calorie malnutrition, severe Continue nutritional supplement, eating mainly breakfast  Essential hypertension Not on medications.    DVT prophylaxis: None for comfort  Disposition: Uncertain at this time.  Long length of stay.  TOC  on board.  Status is: Inpatient Remains inpatient appropriate because: Comfort care.    Code Status:     Code Status: Limited: Do not attempt resuscitation (DNR) -DNR-LIMITED -Do Not Intubate/DNI   Family Communication: None at bedside  Consultants: Neurology Palliative care Wound care Psychiatry  Procedures: None  Anti-infectives:  None  Anti-infectives (From admission, onward)    None      Subjective: Sleeping.  No apparent distress.   Objective: Vitals:   07/19/23 1600 07/19/23 2100  BP: 124/73 127/75  Pulse: 73 77  Resp: 16 17  Temp: 97.8 F (36.6 C) 97.9 F (36.6 C)  SpO2:     No intake or output data in the 24 hours ending 07/20/23 1211    Filed Weights   06/02/23 0500 07/02/23 1700 07/17/23 1531  Weight: 42 kg 42.2 kg 37.4 kg   Body mass index is 13.32 kg/m.   Physical Exam: GENERAL: No apparent distress.  Sleep. RESP:  No IWOB.  On room air. MSK/EXT:   Significant muscle mass and subcu fat loss. SKIN: no apparent skin lesion or wound NEURO: Sleeping. PSYCH: Calm.  No distress or agitation.  Data Review: I have personally reviewed the following laboratory data and studies,  CBC: No results for input(s): "WBC", "NEUTROABS", "HGB", "HCT", "MCV", "PLT" in the last 168 hours.  Basic Metabolic Panel: No results for input(s): "NA", "K", "CL", "CO2", "GLUCOSE", "BUN", "CREATININE", "CALCIUM", "MG", "PHOS" in the last 168 hours.  Liver Function Tests: No results for input(s): "AST", "ALT", "ALKPHOS", "BILITOT", "PROT", "ALBUMIN" in the last 168 hours.  No results for input(s): "LIPASE", "AMYLASE" in the last 168 hours. No results for input(s): "AMMONIA" in  the last 168 hours. Cardiac Enzymes: No results for input(s): "CKTOTAL", "CKMB", "CKMBINDEX", "TROPONINI" in the last 168 hours. BNP (last 3 results) Recent Labs    01/23/23 1239  BNP 9.4    ProBNP (last 3 results) No results for input(s): "PROBNP" in the last 8760  hours.  CBG: No results for input(s): "GLUCAP" in the last 168 hours. No results found for this or any previous visit (from the past 240 hours).   Studies: No results found.    Gillis Santa, MD  [facility] 07/20/2023  If 7PM-7AM, please contact night-coverage

## 2023-07-20 NOTE — Progress Notes (Signed)
 Consult received for IV start. Pt moving around in bed, pulling away from staff, and trying to bite. 3 unsuccessful attempts made by 2 IV RNs. RN at bedside aware and will notify MD.

## 2023-07-21 NOTE — Plan of Care (Signed)
  Problem: Safety: Goal: Non-violent Restraint(s) Outcome: Progressing   Problem: Education: Goal: Knowledge of the prescribed therapeutic regimen will improve Outcome: Progressing   Problem: Coping: Goal: Ability to identify and develop effective coping behavior will improve Outcome: Progressing   Problem: Clinical Measurements: Goal: Quality of life will improve Outcome: Progressing   Problem: Respiratory: Goal: Verbalizations of increased ease of respirations will increase Outcome: Progressing   Problem: Role Relationship: Goal: Family's ability to cope with current situation will improve Outcome: Progressing Goal: Ability to verbalize concerns, feelings, and thoughts to partner or family member will improve Outcome: Progressing   Problem: Pain Management: Goal: Satisfaction with pain management regimen will improve Outcome: Progressing

## 2023-07-21 NOTE — Plan of Care (Signed)
 Patient alert and disoriented. Enclosure bed in place. Total feed. Walked in hallway, assist x2. Safety precautions maintained.   Problem: Safety: Goal: Non-violent Restraint(s) Outcome: Progressing   Problem: Clinical Measurements: Goal: Quality of life will improve Outcome: Progressing   Problem: Coping: Goal: Ability to identify and develop effective coping behavior will improve Outcome: Not Progressing

## 2023-07-21 NOTE — Progress Notes (Signed)
 PROGRESS NOTE  Natalie [name] EAV:[id number] DOB: January 14, 1966 DOA: 01/22/2023 PCP: Lavinia Sharps, NP   LOS: 130 days   Brief narrative: Natalie [name] is 58 year old female, past medical history significant of bipolar disorder with catatonia, hypertension, dementia, B12 deficiency and severe protein calorie malnutrition initially admitted to the hospital for agitation.  Patient was seen by neurology in the past as well and was thought to have neurodegenerative process.  Psychiatry team also followed the patient during hospitalization.  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  Recommend to continue lorazepam. Goal of care remains comfort care.  Long length of stay patient.  TOC on board for disposition.  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  Currently on comfort care.  Cannot be accepted by hospice? CSW relays that no payor source and family cannot manage patient a thome. No dispo plans in place  Terminal care/full comfort care Has MOST form completed 12-19-2022. Comfort care. Pt is terminal care/comfort care. Lovenox stopped on 06-18-2023.  CKD stage 3a, GFR 45-59 ml/min  Not checking blood work further.  Comfort care  Protein-calorie malnutrition, severe Continue nutritional supplement, eating mainly breakfast  Essential hypertension Not on medications.    DVT prophylaxis: None for comfort  Disposition: Uncertain at this time.  Long length of stay.  TOC  on board.  Status is: Inpatient Remains inpatient appropriate because: Comfort care.    Code Status:     Code Status: Limited: Do not attempt resuscitation (DNR) -DNR-LIMITED -Do Not Intubate/DNI   Family Communication: None at bedside  Consultants: Neurology Palliative care Wound care Psychiatry  Procedures: None  Anti-infectives:  None  Anti-infectives (From admission, onward)    None      Subjective: No major events overnight of this morning.  RN at bedside feeding patient.   Objective: Vitals:   07/20/23 2328 07/21/23 0630  BP: 94/67 119/67  Pulse:  (!) 55  Resp:    Temp:  97.9 F (36.6 C)  SpO2:     No intake or output data in the 24 hours ending 07/21/23 1348    Filed Weights   06/02/23 0500 07/02/23 1700 07/17/23 1531  Weight: 42 kg 42.2 kg 37.4 kg   Body mass index is 13.32 kg/m.   Physical Exam: GENERAL: No apparent distress.   RESP:  No IWOB.  On room air. MSK/EXT:   Significant muscle mass and subcu fat loss. SKIN: no apparent skin lesion or wound NEURO: Moving all extremities. PSYCH: Calm.  No distress or agitation.  Data Review: I have personally reviewed the following laboratory data and studies,  CBC: No results for input(s): "WBC", "NEUTROABS", "HGB", "HCT", "MCV", "PLT" in the last 168 hours.  Basic Metabolic Panel: No results for input(s): "NA", "K", "CL", "CO2", "GLUCOSE", "BUN", "CREATININE", "CALCIUM", "MG", "PHOS" in the last 168 hours.  Liver Function Tests: No results for input(s): "AST", "ALT", "ALKPHOS", "BILITOT", "PROT", "ALBUMIN" in the last 168 hours.  No results for input(s): "LIPASE", "AMYLASE" in the last  168 hours. No results for input(s): "AMMONIA" in the last 168 hours. Cardiac Enzymes: No results for input(s): "CKTOTAL", "CKMB", "CKMBINDEX", "TROPONINI" in the last 168 hours. BNP (last 3 results) Recent Labs    01/23/23 1239  BNP 9.4    ProBNP (last 3 results) No results for input(s): "PROBNP"  in the last 8760 hours.  CBG: No results for input(s): "GLUCAP" in the last 168 hours. No results found for this or any previous visit (from the past 240 hours).   Studies: No results found.    Almon Hercules, MD  [facility] 07/21/2023  If 7PM-7AM, please contact night-coverage

## 2023-07-22 NOTE — Progress Notes (Signed)
 PROGRESS NOTE  Natalie Brown ZOX:[id number] DOB: 1965/12/01 DOA: 01/22/2023 PCP: Lavinia Sharps, NP   LOS: 131 days   Brief narrative: Natalie Brown is 58 year old female, past medical history significant of bipolar disorder with catatonia, hypertension, dementia, B12 deficiency and severe protein calorie malnutrition initially admitted to the hospital for agitation.  Patient was seen by neurology in the past as well and was thought to have neurodegenerative process.  Psychiatry team also followed the patient during hospitalization.  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  Recommend to continue lorazepam. Goal of care remains comfort care.  Long length of stay patient.  TOC on board for disposition.  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  Currently on comfort care.  Cannot be accepted by hospice? CSW relays that no payor source and family cannot manage patient a thome. No dispo plans in place  Terminal care/full comfort care Has MOST form completed 12-19-2022. Comfort care. Pt is terminal care/comfort care. Lovenox stopped on 06-18-2023.  CKD stage 3a, GFR 45-59 ml/min  Not checking blood work further.  Comfort care  Protein-calorie malnutrition, severe Continue nutritional supplement, eating mainly breakfast  Essential hypertension Not on medications.    DVT prophylaxis: None for comfort  Disposition: Uncertain at this time.  Long length of stay.  TOC  on board.  Status is: Inpatient Remains inpatient appropriate because: Comfort care.    Code Status:     Code Status: Limited: Do not attempt resuscitation (DNR) -DNR-LIMITED -Do Not Intubate/DNI   Family Communication: None at bedside  Consultants: Neurology Palliative care Wound care Psychiatry  Procedures: None  Anti-infectives:  None  Anti-infectives (From admission, onward)    None      Subjective: No major events overnight of this morning.  Sleeping.   Objective: Vitals:   07/21/23 2140 07/22/23 0947  BP: 99/72 111/67  Pulse: 80 67  Resp: 18 16  Temp: (!) 97.5 F (36.4 C) (!) 97.3 F (36.3 C)  SpO2: 100%     Intake/Output Summary (Last 24 hours) at 07/22/2023 1325 Last data filed at 07/22/2023 0700 Gross per 24 hour  Intake 670 ml  Output --  Net 670 ml      Filed Weights   06/02/23 0500 07/02/23 1700 07/17/23 1531  Weight: 42 kg 42.2 kg 37.4 kg   Body mass index is 13.32 kg/m.   Physical Exam: GENERAL: No apparent distress.  Sleeping. RESP:  No IWOB.  On room air. MSK/EXT:   Significant muscle mass and subcu fat loss. SKIN: no apparent skin lesion or wound NEURO: Moving all extremities. PSYCH: Calm.  No distress or agitation.  Data Review: I have personally reviewed the following laboratory data and studies,  CBC: No results for input(s): "WBC", "NEUTROABS", "HGB", "HCT", "MCV", "PLT" in the last 168 hours.  Basic Metabolic Panel: No results for input(s): "NA", "K", "CL", "CO2", "GLUCOSE", "BUN", "CREATININE", "CALCIUM", "MG", "PHOS" in the last 168 hours.  Liver Function Tests: No results for input(s): "AST", "  ALT", "ALKPHOS", "BILITOT", "PROT", "ALBUMIN" in the last 168 hours.  No results for input(s): "LIPASE", "AMYLASE" in the last 168 hours. No results for input(s): "AMMONIA" in the last 168 hours. Cardiac Enzymes: No results for input(s): "CKTOTAL", "CKMB", "CKMBINDEX", "TROPONINI" in the last 168 hours. BNP (last 3  results) Recent Labs    01/23/23 1239  BNP 9.4    ProBNP (last 3 results) No results for input(s): "PROBNP" in the last 8760 hours.  CBG: No results for input(s): "GLUCAP" in the last 168 hours. No results found for this or any previous visit (from the past 240 hours).   Studies: No results found.    Almon Hercules, MD  [facility] 07/22/2023  If 7PM-7AM, please contact night-coverage

## 2023-07-23 NOTE — Progress Notes (Signed)
 PROGRESS NOTE  DAINE CROKER ZOX:[id number] DOB: 1965-06-21 DOA: 01/22/2023 PCP: Lavinia Sharps, NP   LOS: 132 days   Brief narrative: [name]. Buchbinder is 58 year old female, past medical history significant of bipolar disorder with catatonia, hypertension, dementia, B12 deficiency and severe protein calorie malnutrition initially admitted to the hospital for agitation.  Patient was seen by neurology in the past as well and was thought to have neurodegenerative process.  Psychiatry team also followed the patient during hospitalization.  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  Recommend to continue lorazepam. Goal of care remains comfort care.  Long length of stay patient.  TOC on board for disposition.  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  Currently on comfort care.  Cannot be accepted by hospice? CSW relays that no payor source and family cannot manage patient a thome. No dispo plans in place  Terminal care/full comfort care Has MOST form completed 12-19-2022. Comfort care. Pt is terminal care/comfort care. Lovenox stopped on 06-18-2023.  CKD stage 3a, GFR 45-59 ml/min  Not checking blood work further.  Comfort care  Protein-calorie malnutrition, severe Continue nutritional supplement, eating mainly breakfast  Essential hypertension Not on medications.    DVT prophylaxis: None for comfort  Disposition: Uncertain at this time.  Long length of stay.  TOC  on board.  Status is: Inpatient Remains inpatient appropriate because: Comfort care.    Code Status:     Code Status: Limited: Do not attempt resuscitation (DNR) -DNR-LIMITED -Do Not Intubate/DNI   Family Communication: None at bedside  Consultants: Neurology Palliative care Wound care Psychiatry  Procedures: None  Anti-infectives:  None  Anti-infectives (From admission, onward)    None      Subjective: No major events overnight of this morning.  Sleeping.   Objective: Vitals:   07/23/23 0356 07/23/23 0723  BP: 120/75 112/65  Pulse: 63 61  Resp: 19   Temp: (!) 97.5 F (36.4 C)   SpO2:      Intake/Output Summary (Last 24 hours) at 07/23/2023 1210 Last data filed at 07/22/2023 1822 Gross per 24 hour  Intake 240 ml  Output --  Net 240 ml      Filed Weights   06/02/23 0500 07/02/23 1700 07/17/23 1531  Weight: 42 kg 42.2 kg 37.4 kg   Body mass index is 13.32 kg/m.   Physical Exam: GENERAL: No apparent distress.  Sleeping. RESP:  No IWOB.  On room air. MSK/EXT:   Significant muscle mass and subcu fat loss. SKIN: no apparent skin lesion or wound NEURO: Moving all extremities. PSYCH: Calm.  No distress or agitation.  Data Review: I have personally reviewed the following laboratory data and studies,  CBC: No results for input(s): "WBC", "NEUTROABS", "HGB", "HCT", "MCV", "PLT" in the last 168 hours.  Basic Metabolic Panel: No results for input(s): "NA", "K", "CL", "CO2", "GLUCOSE", "BUN", "CREATININE", "CALCIUM", "MG", "PHOS" in the last 168 hours.  Liver Function Tests: No results for input(s): "AST", "ALT", "ALKPHOS", "BILITOT", "PROT", "  ALBUMIN" in the last 168 hours.  No results for input(s): "LIPASE", "AMYLASE" in the last 168 hours. No results for input(s): "AMMONIA" in the last 168 hours. Cardiac Enzymes: No results for input(s): "CKTOTAL", "CKMB", "CKMBINDEX", "TROPONINI" in the last 168 hours. BNP (last 3 results) Recent Labs     01/23/23 1239  BNP 9.4    ProBNP (last 3 results) No results for input(s): "PROBNP" in the last 8760 hours.  CBG: No results for input(s): "GLUCAP" in the last 168 hours. No results found for this or any previous visit (from the past 240 hours).   Studies: No results found.    Almon Hercules, MD  [facility] 07/23/2023  If 7PM-7AM, please contact night-coverage

## 2023-07-24 NOTE — Progress Notes (Signed)
 PROGRESS NOTE  EVANGELENE VORA Brown:[id number] DOB: 1966-03-06 DOA: 01/22/2023 PCP: Natalie Sharps, NP   LOS: 133 days   Brief narrative: Natalie Brown. Sugrue is 58 year old female, past medical history significant of bipolar disorder with catatonia, hypertension, dementia, B12 deficiency and severe protein calorie malnutrition initially admitted to the hospital for agitation.  Patient was seen by neurology in the past as well and was thought to have neurodegenerative process.  Psychiatry team also followed the patient during hospitalization.  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  Recommend to continue lorazepam. Goal of care remains comfort care.  Long length of stay patient.  TOC on board for disposition.  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  Currently on comfort care.  Cannot be accepted by hospice? CSW relays that no payor source and family cannot manage patient a thome. No dispo plans in place  Terminal care/full comfort care Has MOST form completed 12-19-2022. Comfort care. Pt is terminal care/comfort care. Lovenox stopped on 06-18-2023.  CKD stage 3a, GFR 45-59 ml/min  Not checking blood work further.  Comfort care  Protein-calorie malnutrition, severe Continue nutritional supplement, eating mainly breakfast  Essential hypertension Not on medications.    DVT prophylaxis: None for comfort  Disposition: Uncertain at this time.  Long length of stay.  TOC  on board.  Status is: Inpatient Remains inpatient appropriate because: Comfort care.    Code Status:     Code Status: Limited: Do not attempt resuscitation (DNR) -DNR-LIMITED -Do Not Intubate/DNI   Family Communication: None at bedside  Consultants: Neurology Palliative care Wound care Psychiatry  Procedures: None  Anti-infectives:  None  Anti-infectives (From admission, onward)    None      Subjective: No major events overnight of this morning.  Sleeping.   Objective: Vitals:   07/24/23 0355 07/24/23 0928  BP: 134/87 139/78  Pulse: 67 91  Resp: 18 20  Temp: (!) 97.4 F (36.3 C) 97.9 F (36.6 C)  SpO2: (!) 81% 100%    Intake/Output Summary (Last 24 hours) at 07/24/2023 1053 Last data filed at 07/23/2023 1902 Gross per 24 hour  Intake 120 ml  Output --  Net 120 ml      Filed Weights   06/02/23 0500 07/02/23 1700 07/17/23 1531  Weight: 42 kg 42.2 kg 37.4 kg   Body mass index is 13.32 kg/m.   Physical Exam: GENERAL: No apparent distress.  Sleeping. RESP:  No IWOB.  On room air. MSK/EXT:   Significant muscle mass and subcu fat loss. SKIN: no apparent skin lesion or wound NEURO: Moving all extremities. PSYCH: Calm.  No distress or agitation.  Data Review: I have personally reviewed the following laboratory data and studies,  CBC: No results for input(s): "WBC", "NEUTROABS", "HGB", "HCT", "MCV", "PLT" in the last 168 hours.  Basic Metabolic Panel: No results for input(s): "NA", "K", "CL", "CO2", "GLUCOSE", "BUN", "CREATININE", "CALCIUM", "MG", "PHOS" in the last 168 hours.  Liver Function Tests: No results for input(s): "AST", "  ALT", "ALKPHOS", "BILITOT", "PROT", "ALBUMIN" in the last 168 hours.  No results for input(s): "LIPASE", "AMYLASE" in the last 168 hours. No results for input(s): "AMMONIA" in the last 168 hours. Cardiac Enzymes: No results for input(s): "CKTOTAL", "CKMB", "CKMBINDEX", "TROPONINI" in the last 168 hours. BNP (last 3  results) Recent Labs    01/23/23 1239  BNP 9.4    ProBNP (last 3 results) No results for input(s): "PROBNP" in the last 8760 hours.  CBG: No results for input(s): "GLUCAP" in the last 168 hours. No results found for this or any previous visit (from the past 240 hours).   Studies: No results found.    Almon Hercules, MD  [facility] 07/24/2023  If 7PM-7AM, please contact night-coverage

## 2023-07-24 NOTE — Plan of Care (Signed)
  Problem: Safety: Goal: Non-violent Restraint(s) Outcome: Progressing   Problem: Education: Goal: Knowledge of the prescribed therapeutic regimen will improve Outcome: Progressing   Problem: Coping: Goal: Ability to identify and develop effective coping behavior will improve Outcome: Progressing   Problem: Clinical Measurements: Goal: Quality of life will improve Outcome: Progressing   Problem: Respiratory: Goal: Verbalizations of increased ease of respirations will increase Outcome: Progressing   Problem: Role Relationship: Goal: Family's ability to cope with current situation will improve Outcome: Progressing Goal: Ability to verbalize concerns, feelings, and thoughts to partner or family member will improve Outcome: Progressing   Problem: Pain Management: Goal: Satisfaction with pain management regimen will improve Outcome: Progressing

## 2023-07-25 NOTE — Progress Notes (Signed)
 PROGRESS NOTE  Natalie Brown ZOX:[id number] DOB: [birth year] DOA: 01/22/2023 PCP: Lavinia Sharps, NP   LOS: 134 days   Brief narrative: Natalie Brown is 58 year old female, past medical history significant of bipolar disorder with catatonia, hypertension, dementia, B12 deficiency and severe protein calorie malnutrition initially admitted to the hospital for agitation.  Patient was seen by neurology in the past as well and was thought to have neurodegenerative process.  Psychiatry team also followed the patient during hospitalization.  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  Recommend to continue lorazepam. Goal of care remains comfort care.  Long length of stay patient.  TOC on board for disposition.  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  Currently on comfort care.  Cannot be accepted by hospice? CSW relays that no payor source and family cannot manage patient a thome. No dispo plans in place  Terminal care/full comfort care Has MOST form completed 12-19-2022. Comfort care. Pt is terminal care/comfort care. Lovenox stopped on 06-18-2023.  CKD stage 3a, GFR 45-59 ml/min  Not checking blood work further.  Comfort care  Protein-calorie malnutrition, severe Continue nutritional supplement, eating mainly breakfast  Essential hypertension Not on medications.    DVT prophylaxis: None for comfort  Disposition: Uncertain at this time.  Long length of stay.  TOC  on board.  Status is: Inpatient Remains inpatient appropriate because: Comfort care.    Code Status:     Code Status: Limited: Do not attempt resuscitation (DNR) -DNR-LIMITED -Do Not Intubate/DNI   Family Communication: None at bedside  Consultants: Neurology Palliative care Wound care Psychiatry  Procedures: None  Anti-infectives:  None  Anti-infectives (From admission, onward)    None      Subjective: No major events overnight of this morning.  Sleeping.   Objective: Vitals:   07/24/23 0928 07/24/23 2054  BP: 139/78 131/77  Pulse: 91 74  Resp: 20 16  Temp: 97.9 F (36.6 C) 98.1 F (36.7 C)  SpO2: 100% 100%    Intake/Output Summary (Last 24 hours) at 07/25/2023 1114 Last data filed at 07/25/2023 0958 Gross per 24 hour  Intake 595 ml  Output --  Net 595 ml      Filed Weights   06/02/23 0500 07/02/23 1700 07/17/23 1531  Weight: 42 kg 42.2 kg 37.4 kg   Body mass index is 13.32 kg/m.   Physical Exam: GENERAL: No apparent distress.  Sleeping. RESP:  No IWOB.  On room air. MSK/EXT:   Significant muscle mass and subcu fat loss. SKIN: no apparent skin lesion or wound NEURO: Moving all extremities. PSYCH: Calm.  No distress or agitation.  Data Review: I have personally reviewed the following laboratory data and studies,  CBC: No results for input(s): "WBC", "NEUTROABS", "HGB", "HCT", "MCV", "PLT" in the last 168 hours.  Basic Metabolic Panel: No results for input(s): "NA", "K", "CL", "CO2", "GLUCOSE", "BUN", "CREATININE", "CALCIUM", "MG", "PHOS" in the last 168 hours.  Liver Function Tests: No results for input(s): "AST", "ALT", "ALKPHOS", "  BILITOT", "PROT", "ALBUMIN" in the last 168 hours.  No results for input(s): "LIPASE", "AMYLASE" in the last 168 hours. No results for input(s): "AMMONIA" in the last 168 hours. Cardiac Enzymes: No results for input(s): "CKTOTAL", "CKMB", "CKMBINDEX", "TROPONINI" in the last 168 hours. BNP (last 3  results) Recent Labs    01/23/23 1239  BNP 9.4    ProBNP (last 3 results) No results for input(s): "PROBNP" in the last 8760 hours.  CBG: No results for input(s): "GLUCAP" in the last 168 hours. No results found for this or any previous visit (from the past 240 hours).   Studies: No results found.    Almon Hercules, MD  [facility] 07/25/2023  If 7PM-7AM, please contact night-coverage

## 2023-07-25 NOTE — Plan of Care (Signed)
  Problem: Safety: Goal: Non-violent Restraint(s) Outcome: Progressing   Problem: Education: Goal: Knowledge of the prescribed therapeutic regimen will improve Outcome: Progressing   Problem: Coping: Goal: Ability to identify and develop effective coping behavior will improve Outcome: Progressing   Problem: Clinical Measurements: Goal: Quality of life will improve Outcome: Progressing   Problem: Respiratory: Goal: Verbalizations of increased ease of respirations will increase Outcome: Progressing   Problem: Role Relationship: Goal: Family's ability to cope with current situation will improve Outcome: Progressing Goal: Ability to verbalize concerns, feelings, and thoughts to partner or family member will improve Outcome: Progressing   Problem: Pain Management: Goal: Satisfaction with pain management regimen will improve Outcome: Progressing

## 2023-07-26 NOTE — Progress Notes (Signed)
 PROGRESS NOTE  Natalie Brown UJW:[id number] DOB: 1965/10/17 DOA: 01/22/2023 PCP: Lavinia Sharps, NP   LOS: 135 days   Brief narrative: Natalie Schlein. Brown is 58 year old female, past medical history significant of bipolar disorder with catatonia, hypertension, dementia, B12 deficiency and severe protein calorie malnutrition initially admitted to the hospital for agitation.  Patient was seen by neurology in the past as well and was thought to have neurodegenerative process.  Psychiatry team also followed the patient during hospitalization.  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  Recommend to continue lorazepam. Goal of care remains comfort care.  Long length of stay patient.  TOC on board for disposition.  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  Currently on comfort care.  Cannot be accepted by hospice? CSW relays that no payor source and family cannot manage patient a thome. No dispo plans in place  Terminal care/full comfort care Has MOST form completed 12-19-2022. Comfort care. Pt is terminal care/comfort care. Lovenox stopped on 06-18-2023.  CKD stage 3a, GFR 45-59 ml/min  Not checking blood work further.  Comfort care  Protein-calorie malnutrition, severe Continue nutritional supplement, eating mainly breakfast  Essential hypertension Not on medications.    DVT prophylaxis: None for comfort  Disposition: Uncertain at this time.  Long length of stay.  TOC  on board.  Status is: Inpatient Remains inpatient appropriate because: Comfort care.    Code Status:     Code Status: Limited: Do not attempt resuscitation (DNR) -DNR-LIMITED -Do Not Intubate/DNI   Family Communication: None at bedside  Consultants: Neurology Palliative care Wound care Psychiatry  Procedures: None  Anti-infectives:  None  Anti-infectives (From admission, onward)    None      Subjective: No major events overnight of this morning.  Sleeping.   Objective: Vitals:   07/24/23 0928 07/24/23 2054  BP: 139/78 131/77  Pulse: 91 74  Resp: 20 16  Temp: 97.9 F (36.6 C) 98.1 F (36.7 C)  SpO2: 100% 100%   No intake or output data in the 24 hours ending 07/26/23 1103     Filed Weights   06/02/23 0500 07/02/23 1700 07/17/23 1531  Weight: 42 kg 42.2 kg 37.4 kg   Body mass index is 13.32 kg/m.   Physical Exam: GENERAL: No apparent distress.  Sleeping. RESP:  No IWOB.  On room air. MSK/EXT:   Significant muscle mass and subcu fat loss. SKIN: no apparent skin lesion or wound NEURO: Moving all extremities. PSYCH: Calm.  No distress or agitation.  Data Review: I have personally reviewed the following laboratory data and studies,  CBC: No results for input(s): "WBC", "NEUTROABS", "HGB", "HCT", "MCV", "PLT" in the last 168 hours.  Basic Metabolic Panel: No results for input(s): "NA", "K", "CL", "CO2", "GLUCOSE", "BUN", "CREATININE", "CALCIUM", "MG", "PHOS" in the last 168 hours.  Liver Function Tests: No results for input(s): "AST", "ALT", "ALKPHOS", "BILITOT", "PROT", "ALBUMIN" in the last 168 hours.  No results for input(s): "LIPASE", "AMYLASE" in the last 168  hours. No results for input(s): "AMMONIA" in the last 168 hours. Cardiac Enzymes: No results for input(s): "CKTOTAL", "CKMB", "CKMBINDEX", "TROPONINI" in the last 168 hours. BNP (last 3 results) Recent Labs    01/23/23 1239  BNP 9.4    ProBNP (last 3 results) No results for  input(s): "PROBNP" in the last 8760 hours.  CBG: No results for input(s): "GLUCAP" in the last 168 hours. No results found for this or any previous visit (from the past 240 hours).   Studies: No results found.    Almon Hercules, MD  [facility] 07/26/2023  If 7PM-7AM, please contact night-coverage

## 2023-07-26 NOTE — Plan of Care (Signed)
 Patient alert and confused. Enclosure bed. Ambulated in hallway, Ax2. LBM today. Safety precautions maintained.    Problem: Safety: Goal: Non-violent Restraint(s) Outcome: Progressing   Problem: Coping: Goal: Ability to identify and develop effective coping behavior will improve Outcome: Not Progressing

## 2023-07-26 NOTE — Plan of Care (Signed)
  Problem: Safety: Goal: Non-violent Restraint(s) Outcome: Progressing   Problem: Education: Goal: Knowledge of the prescribed therapeutic regimen will improve Outcome: Progressing   Problem: Coping: Goal: Ability to identify and develop effective coping behavior will improve Outcome: Progressing   Problem: Clinical Measurements: Goal: Quality of life will improve Outcome: Progressing   Problem: Respiratory: Goal: Verbalizations of increased ease of respirations will increase Outcome: Progressing   Problem: Role Relationship: Goal: Family's ability to cope with current situation will improve Outcome: Progressing Goal: Ability to verbalize concerns, feelings, and thoughts to partner or family member will improve Outcome: Progressing   Problem: Pain Management: Goal: Satisfaction with pain management regimen will improve Outcome: Progressing

## 2023-07-27 NOTE — Progress Notes (Signed)
 [facility] PROGRESS NOTE  Natalie Brown (DOB: 24-Sep-1965) RUE:[id number] PCP: Lavinia Sharps, NP  Brief Narrative: Natalie Brown is 58 year old female, past medical history significant of bipolar disorder with catatonia, hypertension, dementia, B12 deficiency and severe protein calorie malnutrition initially admitted to the hospital for agitation.  Patient was seen by neurology in the past as well and was thought to have neurodegenerative process.  Psychiatry team also followed the patient during hospitalization.  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  Recommend to continue lorazepam. Goal of care remains comfort care.  Long length of stay patient.  TOC on board for disposition.  Subjective: Nonverbal, no overnight events reported.   Objective: BP 104/64 (BP Location: Right Arm)   Pulse 71   Temp 97.7 F (36.5 C) (Oral)   Resp 17   Ht 5\' 6"  (1.676 m)   Wt 37.4 kg   SpO2 100%   BMI 13.32 kg/m   Gen: No distress, resting quietly Pulm: Nonlabored  Assessment & Plan: Early onset severe Dementia with behavioral disturbance  Continue Ativan and trazodone.  Ideally would have a sitter in lieu of restraints.  Currently on comfort care.  Cannot be accepted by hospice? CSW relays that no payor source and family cannot manage patient a thome. No dispo plans in place   Terminal care/full comfort care Has MOST form completed 12-19-2022. Comfort care. Pt is terminal care/comfort care. Lovenox stopped on 06-18-2023.   CKD stage 3a, GFR 45-59 ml/min  Not checking blood work further.  Comfort care   Protein-calorie malnutrition, severe Continue nutritional supplement, eating mainly breakfast   Essential hypertension Not on medications.  Natalie Nine, MD [facility] [email or web address] 07/27/2023, 12:14 PM

## 2023-07-27 NOTE — Plan of Care (Signed)
  Problem: Safety: Goal: Non-violent Restraint(s) Outcome: Progressing   Problem: Education: Goal: Knowledge of the prescribed therapeutic regimen will improve Outcome: Progressing   Problem: Coping: Goal: Ability to identify and develop effective coping behavior will improve Outcome: Progressing   Problem: Clinical Measurements: Goal: Quality of life will improve Outcome: Progressing   Problem: Respiratory: Goal: Verbalizations of increased ease of respirations will increase Outcome: Progressing   Problem: Role Relationship: Goal: Family's ability to cope with current situation will improve Outcome: Progressing Goal: Ability to verbalize concerns, feelings, and thoughts to partner or family member will improve Outcome: Progressing   Problem: Pain Management: Goal: Satisfaction with pain management regimen will improve Outcome: Progressing

## 2023-07-27 NOTE — Progress Notes (Incomplete)
 MD made aware about restraints order,

## 2023-07-28 NOTE — Progress Notes (Addendum)
 [facility] PROGRESS NOTE  Natalie Brown (DOB: [birth year]) ZOX:[id number] PCP: Lavinia Sharps, NP  Brief Narrative: [name]. [name] is 58 year old female, past medical history significant of bipolar disorder with catatonia, hypertension, dementia, B12 deficiency and severe protein calorie malnutrition initially admitted to the hospital for agitation.  Patient was seen by neurology in the past as well and was thought to have neurodegenerative process.  Psychiatry team also followed the patient during hospitalization.  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  Recommend to continue lorazepam. Goal of care remains comfort care.  Long length of stay patient.  TOC on board for disposition.  Subjective: Nonverbal, sleeping  Objective: BP (!) 90/58   Pulse 85   Temp 98.7 F (37.1 C) (Oral)   Resp 18   Ht 5\' 6"  (1.676 m)   Wt 37.4 kg   SpO2 100%   BMI 13.32 kg/m   Sleeping quietly, no distress  Assessment & Plan: Early onset severe Dementia with behavioral disturbance  Continue Ativan and trazodone.  Currently on comfort care.  TOC following No dispo plans in place   Terminal care/full comfort care Has MOST form completed 12-19-2022. Comfort care.    CKD stage 3a, GFR 45-59 ml/min  - Comfort care   Protein-calorie malnutrition, severe Continue nutritional supplements   Essential hypertension Not on medications.  Zannie Cove, MD [facility] [email or web address] 07/28/2023, 11:15 AM

## 2023-07-28 NOTE — Plan of Care (Signed)
  Problem: Safety: Goal: Non-violent Restraint(s) Outcome: Not Progressing   Problem: Clinical Measurements: Goal: Quality of life will improve Outcome: Not Progressing

## 2023-07-29 DIAGNOSIS — R45851 Suicidal ideations: Secondary | ICD-10-CM

## 2023-07-29 NOTE — Progress Notes (Signed)
 [facility]                                                                              Natalie Brown, is a 58 y.o. female, DOB - 08/28/1965, ONG:[id number] Admit date - 01/22/2023    Outpatient Primary MD for the patient is [name], [name], NP  LOS - 138  days  Chief Complaint  Patient presents with   Agitation       Brief summary   Patient is a 58 year old female, past medical history significant of bipolar disorder with catatonia, hypertension, dementia, B12 deficiency and severe protein calorie malnutrition initially admitted to the hospital for agitation.  Patient was seen by neurology in the past as well and was thought to have neurodegenerative process.  Psychiatry team also followed the patient during hospitalization.  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  Recommend to continue lorazepam. Goal of care remains comfort care.  TOC following for disposition.   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  Currently on comfort care.  TOC following No dispo plans in place   Terminal care/full comfort care Has MOST form completed 12-19-2022. Comfort care.    CKD stage 3a, GFR 45-59 ml/min  - Comfort care  Essential hypertension -Not on any medications   Protein-calorie malnutrition, severe, underweight Continue nutritional supplements Etiology: chronic illness Signs/Symptoms: severe fat depletion, severe muscle depletion, energy intake < or equal to 50% for > or equal to 5 days Interventions: Magic cup  Estimated body mass index is 13.32 kg/m as calculated from the following:   Height as of this encounter: 5\' 6"  (1.676 m).   Weight as of this encounter: 37.4 kg.  Code Status:  DNR, comfort care DVT Prophylaxis:     Level of Care: Level of care: Med-Surg Family Communication:  Disposition Plan:      Remains inpatient appropriate: Awaiting disposition    Consultants:   Psychiatry Neurology  Antimicrobials:   Anti-infectives (From admission, onward)    None          Medications  feeding supplement  237 mL Oral BID BM   LORazepam  0.5 mg Oral TID      Subjective:   Natalie Brown was seen and examined today.  Nonverbal, not awake and fidgeting, no meaningful conversation or following commands.  Objective:   Vitals:   07/27/23 0830 07/28/23 0412 07/28/23 0421 07/29/23 0424  BP: 104/64 (!) 79/59 (!) 90/58 91/67  Pulse: 71 85  67  Resp: 17 18  18   Temp: 97.7 F (36.5 C) 98.7 F (37.1 C)  98.8 F (37.1 C)  TempSrc: Oral Oral    SpO2: 100%     Weight:      Height:        Intake/Output Summary (Last 24 hours) at 07/29/2023 0926 Last data filed at 07/29/2023 0800 Gross per 24 hour  Intake 620 ml  Output --  Net 620 ml     Wt Readings from Last 3 Encounters:  07/17/23 37.4 kg  01/21/23 46 kg  12/24/21 72 kg    Data Reviewed:  I have personally reviewed following labs    CBC Lab Results  Component Value Date   WBC 5.4 07/08/2023   RBC 4.43 07/08/2023   HGB 13.2 07/08/2023   HCT 39.9 07/08/2023   MCV 90.1 07/08/2023   MCH 29.8 07/08/2023   PLT 293 07/08/2023   MCHC 33.1 07/08/2023   RDW 13.9 07/08/2023   LYMPHSABS 2.6 07/08/2023   MONOABS 0.4 07/08/2023   EOSABS 0.1 07/08/2023   BASOSABS 0.1 07/08/2023     Last metabolic panel Lab Results  Component Value Date   NA 142 07/08/2023   K 4.1 07/08/2023   CL 108 07/08/2023   CO2 25 07/08/2023   BUN 21 (H) 07/08/2023   CREATININE 1.03 (H) 07/08/2023   GLUCOSE 73 07/08/2023   GFRNONAA >60 07/08/2023   GFRAA 65 09/19/2017   CALCIUM 8.9 07/08/2023   PHOS 3.0 03/26/2023   PROT 6.7 07/08/2023   ALBUMIN 3.1 (L) 07/08/2023   LABGLOB 2.8 06/21/2020   AGRATIO 1.5  06/21/2020   BILITOT 0.5 07/08/2023   ALKPHOS 46 07/08/2023   AST 19 07/08/2023   ALT 15 07/08/2023   ANIONGAP 9 07/08/2023    CBG (last 3)  No results for input(s): "GLUCAP" in the last 72 hours.    Coagulation Profile: No results for input(s): "INR", "PROTIME" in the last 168 hours.   Radiology Studies: I have personally reviewed the imaging studies  No results found.     Thad Ranger M.D. [facility] 07/29/2023, 9:26 AM  Available via Epic secure chat 7am-7pm After 7 pm, please refer to night coverage provider listed on amion.

## 2023-07-29 NOTE — Plan of Care (Signed)
  Problem: Safety: Goal: Non-violent Restraint(s) Outcome: Progressing   Problem: Clinical Measurements: Goal: Quality of life will improve Outcome: Progressing   Problem: Pain Management: Goal: Satisfaction with pain management regimen will improve Outcome: Progressing

## 2023-07-29 NOTE — Plan of Care (Signed)
  Problem: Safety: Goal: Non-violent Restraint(s) Outcome: Progressing   Problem: Education: Goal: Knowledge of the prescribed therapeutic regimen will improve Outcome: Progressing   Problem: Coping: Goal: Ability to identify and develop effective coping behavior will improve Outcome: Progressing   Problem: Clinical Measurements: Goal: Quality of life will improve Outcome: Progressing   Problem: Respiratory: Goal: Verbalizations of increased ease of respirations will increase Outcome: Progressing   Problem: Role Relationship: Goal: Family's ability to cope with current situation will improve Outcome: Progressing Goal: Ability to verbalize concerns, feelings, and thoughts to partner or family member will improve Outcome: Progressing   Problem: Pain Management: Goal: Satisfaction with pain management regimen will improve Outcome: Progressing

## 2023-07-30 NOTE — Progress Notes (Signed)
 [facility]                                                                              Natalie Brown, is a 58 y.o. female, DOB - 02-19-1966, JXB:[id number] Admit date - 01/22/2023    Outpatient Primary MD for the patient is [name], [name], NP  LOS - 139  days  Chief Complaint  Patient presents with   Agitation       Brief summary   Patient is a 58 year old female, past medical history significant of bipolar disorder with catatonia, hypertension, dementia, B12 deficiency and severe protein calorie malnutrition initially admitted to the hospital for agitation.  Patient was seen by neurology in the past as well and was thought to have neurodegenerative process.  Psychiatry team also followed the patient during hospitalization.  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  Recommend to continue lorazepam. Goal of care remains comfort care.  TOC following for disposition.   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  Currently on comfort care.  TOC following No dispo plans in place   Terminal care/full comfort care Has MOST form completed 12-19-2022. Comfort care.    CKD stage 3a, GFR 45-59 ml/min  - Comfort care  Essential hypertension -Not on any medications   Protein-calorie malnutrition, severe, underweight Continue nutritional supplements Etiology: chronic illness Signs/Symptoms: severe fat depletion, severe muscle depletion, energy intake < or equal to 50% for > or equal to 5 days Interventions: Magic cup  Estimated body mass index is 13.32 kg/m as calculated from the following:   Height as of this encounter: 5\' 6"  (1.676 m).   Weight as of this encounter: 37.4 kg.  Code Status:  DNR, comfort care DVT Prophylaxis:     Level of Care: Level of care: Med-Surg Family Communication:  Disposition Plan:      Remains inpatient appropriate: Awaiting disposition    Consultants:   Psychiatry Neurology  Antimicrobials:   Anti-infectives (From admission, onward)    None          Medications  feeding supplement  237 mL Oral BID BM   LORazepam  0.5 mg Oral TID      Subjective:   Natalie Brown was seen and examined today.  Nonverbal, not following any commands.  No acute issues overnight.  Objective:   Vitals:   07/28/23 0421 07/29/23 0424 07/29/23 1527 07/30/23 1002  BP: (!) 90/58 91/67 99/74  92/65  Pulse:  67  75  Resp:  18 20 18   Temp:  98.8 F (37.1 C) 98.1 F (36.7 C) 98.4 F (36.9 C)  TempSrc:   Oral Axillary  SpO2:   96% 99%  Weight:      Height:        Intake/Output Summary (Last 24 hours) at 07/30/2023 1053 Last data filed at 07/29/2023 1200 Gross per 24 hour  Intake 240  ml  Output --  Net 240 ml     Wt Readings from Last 3 Encounters:  07/17/23 37.4 kg  01/21/23 46 kg  12/24/21 72 kg    Data Reviewed:  I have personally reviewed following labs    CBC Lab Results  Component Value Date   WBC 5.4 07/08/2023   RBC 4.43 07/08/2023   HGB 13.2 07/08/2023   HCT 39.9 07/08/2023   MCV 90.1 07/08/2023   MCH 29.8 07/08/2023   PLT 293 07/08/2023   MCHC 33.1 07/08/2023   RDW 13.9 07/08/2023   LYMPHSABS 2.6 07/08/2023   MONOABS 0.4 07/08/2023   EOSABS 0.1 07/08/2023   BASOSABS 0.1 07/08/2023     Last metabolic panel Lab Results  Component Value Date   NA 142 07/08/2023   K 4.1 07/08/2023   CL 108 07/08/2023   CO2 25 07/08/2023   BUN 21 (H) 07/08/2023   CREATININE 1.03 (H) 07/08/2023   GLUCOSE 73 07/08/2023   GFRNONAA >60 07/08/2023   GFRAA 65 09/19/2017   CALCIUM 8.9 07/08/2023   PHOS 3.0 03/26/2023   PROT 6.7 07/08/2023   ALBUMIN 3.1 (L) 07/08/2023   LABGLOB 2.8 06/21/2020   AGRATIO 1.5 06/21/2020   BILITOT  0.5 07/08/2023   ALKPHOS 46 07/08/2023   AST 19 07/08/2023   ALT 15 07/08/2023   ANIONGAP 9 07/08/2023    CBG (last 3)  No results for input(s): "GLUCAP" in the last 72 hours.    Coagulation Profile: No results for input(s): "INR", "PROTIME" in the last 168 hours.   Radiology Studies: I have personally reviewed the imaging studies  No results found.     Thad Ranger M.D. [facility] 07/30/2023, 10:53 AM  Available via Epic secure chat 7am-7pm After 7 pm, please refer to night coverage provider listed on amion.

## 2023-07-30 NOTE — Plan of Care (Signed)
  Problem: Safety: Goal: Non-violent Restraint(s) Outcome: Progressing   Problem: Education: Goal: Knowledge of the prescribed therapeutic regimen will improve Outcome: Progressing   Problem: Coping: Goal: Ability to identify and develop effective coping behavior will improve Outcome: Progressing   Problem: Clinical Measurements: Goal: Quality of life will improve Outcome: Progressing   Problem: Respiratory: Goal: Verbalizations of increased ease of respirations will increase Outcome: Progressing   Problem: Role Relationship: Goal: Family's ability to cope with current situation will improve Outcome: Progressing

## 2023-07-31 NOTE — Progress Notes (Signed)
 [facility]                                                                              Natalie Brown, is a 58 y.o. female, DOB - [birth year], [id number] Admit date - 01/22/2023    Outpatient Primary MD for the patient is [name], [name], NP  LOS - 140  days  Chief Complaint  Patient presents with   Agitation       Brief summary   Patient is a 58 year old female, past medical history significant of bipolar disorder with catatonia, hypertension, dementia, B12 deficiency and severe protein calorie malnutrition initially admitted to the hospital for agitation.  Patient was seen by neurology in the past as well and was thought to have neurodegenerative process.  Psychiatry team also followed the patient during hospitalization.  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  Recommend to continue lorazepam. Goal of care remains comfort care.  TOC following for disposition.   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  Currently on comfort care.  TOC following No dispo plans in place   Terminal care/full comfort care Has MOST form completed 12-19-2022. Comfort care.    CKD stage 3a, GFR 45-59 ml/min  - Comfort care  Essential hypertension -Not on any medications   Protein-calorie malnutrition, severe, underweight Continue nutritional supplements Etiology: chronic illness Signs/Symptoms: severe fat depletion, severe muscle depletion, energy intake < or equal to 50% for > or equal to 5 days Interventions: Magic cup  Estimated body mass index is 13.32 kg/m as calculated from the following:   Height as of this encounter: 5\' 6"  (1.676 m).   Weight as of this encounter: 37.4 kg.  Code Status:  DNR, comfort care DVT Prophylaxis:     Level of Care: Level of care: Med-Surg Family Communication:  Disposition Plan:      Remains inpatient appropriate: Awaiting disposition    Consultants:   Psychiatry Neurology  Antimicrobials:   Anti-infectives (From admission, onward)    None          Medications  feeding supplement  237 mL Oral BID BM   LORazepam  0.5 mg Oral TID      Subjective:   Natalie Brown was seen and examined today.  No acute issues.  Nonverbal, does not make eye contact.  Fidgeting.  Objective:   Vitals:   07/29/23 1527 07/30/23 1002 07/30/23 2000 07/31/23 0757  BP: 99/74 92/65 94/60  117/86  Pulse:  75 78 81  Resp: 20 18 18    Temp: 98.1 F (36.7 C) 98.4 F (36.9 C) 98 F (36.7 C)   TempSrc: Oral Axillary Axillary   SpO2: 96% 99% 99% 100%  Weight:      Height:        Intake/Output Summary (Last 24 hours) at 07/31/2023 1125 Last data filed at 07/30/2023 1256 Gross per 24 hour  Intake  462 ml  Output --  Net 462 ml     Wt Readings from Last 3 Encounters:  07/17/23 37.4 kg  01/21/23 46 kg  12/24/21 72 kg    Data Reviewed:  I have personally reviewed following labs    CBC Lab Results  Component Value Date   WBC 5.4 07/08/2023   RBC 4.43 07/08/2023   HGB 13.2 07/08/2023   HCT 39.9 07/08/2023   MCV 90.1 07/08/2023   MCH 29.8 07/08/2023   PLT 293 07/08/2023   MCHC 33.1 07/08/2023   RDW 13.9 07/08/2023   LYMPHSABS 2.6 07/08/2023   MONOABS 0.4 07/08/2023   EOSABS 0.1 07/08/2023   BASOSABS 0.1 07/08/2023     Last metabolic panel Lab Results  Component Value Date   NA 142 07/08/2023   K 4.1 07/08/2023   CL 108 07/08/2023   CO2 25 07/08/2023   BUN 21 (H) 07/08/2023   CREATININE 1.03 (H) 07/08/2023   GLUCOSE 73 07/08/2023   GFRNONAA >60 07/08/2023   GFRAA 65 09/19/2017   CALCIUM 8.9 07/08/2023   PHOS 3.0 03/26/2023   PROT 6.7 07/08/2023   ALBUMIN 3.1 (L) 07/08/2023   LABGLOB 2.8 06/21/2020   AGRATIO 1.5  06/21/2020   BILITOT 0.5 07/08/2023   ALKPHOS 46 07/08/2023   AST 19 07/08/2023   ALT 15 07/08/2023   ANIONGAP 9 07/08/2023    CBG (last 3)  No results for input(s): "GLUCAP" in the last 72 hours.    Coagulation Profile: No results for input(s): "INR", "PROTIME" in the last 168 hours.   Radiology Studies: I have personally reviewed the imaging studies  No results found.     Thad Ranger M.D. [facility] 07/31/2023, 11:25 AM  Available via Epic secure chat 7am-7pm After 7 pm, please refer to night coverage provider listed on amion.

## 2023-07-31 NOTE — Plan of Care (Signed)
  Problem: Safety: Goal: Non-violent Restraint(s) Outcome: Progressing   Problem: Education: Goal: Knowledge of the prescribed therapeutic regimen will improve Outcome: Progressing   Problem: Coping: Goal: Ability to identify and develop effective coping behavior will improve Outcome: Progressing   Problem: Clinical Measurements: Goal: Quality of life will improve Outcome: Progressing   Problem: Respiratory: Goal: Verbalizations of increased ease of respirations will increase Outcome: Progressing   Problem: Role Relationship: Goal: Family's ability to cope with current situation will improve Outcome: Progressing Goal: Ability to verbalize concerns, feelings, and thoughts to partner or family member will improve Outcome: Progressing   Problem: Pain Management: Goal: Satisfaction with pain management regimen will improve Outcome: Progressing

## 2023-08-01 NOTE — Progress Notes (Signed)
 [facility]                                                                              Natalie Brown, is a 58 y.o. female, DOB - Feb 06, 1966, [id number] Admit date - 01/22/2023    Outpatient Primary MD for the patient is [name], [name], NP  LOS - 141  days  Chief Complaint  Patient presents with   Agitation       Brief summary   Patient is a 58 year old female, past medical history significant of bipolar disorder with catatonia, hypertension, dementia, B12 deficiency and severe protein calorie malnutrition initially admitted to the hospital for agitation.  Patient was seen by neurology in the past as well and was thought to have neurodegenerative process.  Psychiatry team also followed the patient during hospitalization.  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  Recommend to continue lorazepam. Goal of care remains comfort care.  TOC following for disposition.   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  Currently on comfort care.  TOC following No dispo plans in place   Terminal care/full comfort care Has MOST form completed 12-19-2022. Comfort care.    CKD stage 3a, GFR 45-59 ml/min  - Comfort care  Essential hypertension -Not on any medications   Protein-calorie malnutrition, severe, underweight Continue nutritional supplements Etiology: chronic illness Signs/Symptoms: severe fat depletion, severe muscle depletion, energy intake < or equal to 50% for > or equal to 5 days Interventions: Magic cup  Estimated body mass index is 13.32 kg/m as calculated from the following:   Height as of this encounter: 5\' 6"  (1.676 m).   Weight as of this encounter: 37.4 kg.  Code Status:  DNR, comfort care DVT Prophylaxis:     Level of Care: Level of care: Med-Surg Family Communication:  Disposition Plan:      Remains inpatient appropriate: Awaiting disposition    Consultants:   Psychiatry Neurology  Antimicrobials:   Anti-infectives (From admission, onward)    None          Medications  feeding supplement  237 mL Oral BID BM   LORazepam  0.5 mg Oral TID      Subjective:   Natalie Brown was seen and examined today.  No acute issues.  Nonverbal, no change.   Objective:   Vitals:   07/30/23 1002 07/30/23 2000 07/31/23 0757 08/01/23 0808  BP: 92/65 94/60 117/86 95/80  Pulse: 75 78 81 91  Resp: 18 18    Temp: 98.4 F (36.9 C) 98 F (36.7 C)  (!) 97.4 F (36.3 C)  TempSrc: Axillary Axillary  Oral  SpO2: 99% 99% 100%   Weight:      Height:        Intake/Output Summary (Last 24 hours) at 08/01/2023 1146 Last data filed at 08/01/2023 0957 Gross per 24 hour  Intake 716 ml  Output --  Net 716 ml     Wt Readings from Last 3 Encounters:  07/17/23 37.4 kg  01/21/23 46 kg  12/24/21 72 kg    Data Reviewed:  I have personally reviewed following labs    CBC Lab Results  Component Value Date   WBC 5.4 07/08/2023   RBC 4.43 07/08/2023   HGB 13.2 07/08/2023   HCT 39.9 07/08/2023   MCV 90.1 07/08/2023   MCH 29.8 07/08/2023   PLT 293 07/08/2023   MCHC 33.1 07/08/2023   RDW 13.9 07/08/2023   LYMPHSABS 2.6 07/08/2023   MONOABS 0.4 07/08/2023   EOSABS 0.1 07/08/2023   BASOSABS 0.1 07/08/2023     Last metabolic panel Lab Results  Component Value Date   NA 142 07/08/2023   K 4.1 07/08/2023   CL 108 07/08/2023   CO2 25 07/08/2023   BUN 21 (H) 07/08/2023   CREATININE 1.03 (H) 07/08/2023   GLUCOSE 73 07/08/2023   GFRNONAA >60 07/08/2023   GFRAA 65 09/19/2017   CALCIUM 8.9 07/08/2023   PHOS 3.0 03/26/2023   PROT 6.7 07/08/2023   ALBUMIN 3.1 (L) 07/08/2023   LABGLOB 2.8 06/21/2020   AGRATIO 1.5 06/21/2020   BILITOT 0.5  07/08/2023   ALKPHOS 46 07/08/2023   AST 19 07/08/2023   ALT 15 07/08/2023   ANIONGAP 9 07/08/2023    CBG (last 3)  No results for input(s): "GLUCAP" in the last 72 hours.    Coagulation Profile: No results for input(s): "INR", "PROTIME" in the last 168 hours.   Radiology Studies: I have personally reviewed the imaging studies  No results found.     Thad Ranger M.D. [facility] 08/01/2023, 11:46 AM  Available via Epic secure chat 7am-7pm After 7 pm, please refer to night coverage provider listed on amion.

## 2023-08-01 NOTE — Plan of Care (Signed)
  Problem: Safety: Goal: Non-violent Restraint(s) Outcome: Progressing   Problem: Coping: Goal: Ability to identify and develop effective coping behavior will improve Outcome: Progressing   Problem: Clinical Measurements: Goal: Quality of life will improve Outcome: Progressing   Problem: Pain Management: Goal: Satisfaction with pain management regimen will improve Outcome: Progressing

## 2023-08-01 NOTE — Plan of Care (Signed)
  Problem: Safety: Goal: Non-violent Restraint(s) Outcome: Progressing   Problem: Education: Goal: Knowledge of the prescribed therapeutic regimen will improve Outcome: Progressing   Problem: Coping: Goal: Ability to identify and develop effective coping behavior will improve Outcome: Progressing   Problem: Clinical Measurements: Goal: Quality of life will improve Outcome: Progressing   Problem: Respiratory: Goal: Verbalizations of increased ease of respirations will increase Outcome: Progressing   Problem: Role Relationship: Goal: Family's ability to cope with current situation will improve Outcome: Progressing Goal: Ability to verbalize concerns, feelings, and thoughts to partner or family member will improve Outcome: Progressing   Problem: Pain Management: Goal: Satisfaction with pain management regimen will improve Outcome: Progressing

## 2023-08-02 NOTE — Plan of Care (Signed)
  Problem: Safety: Goal: Non-violent Restraint(s) Outcome: Progressing   Problem: Role Relationship: Goal: Family's ability to cope with current situation will improve Outcome: Progressing   Problem: Pain Management: Goal: Satisfaction with pain management regimen will improve Outcome: Progressing

## 2023-08-02 NOTE — Plan of Care (Signed)
  Problem: Safety: Goal: Non-violent Restraint(s) Outcome: Progressing   Problem: Education: Goal: Knowledge of the prescribed therapeutic regimen will improve Outcome: Progressing   Problem: Coping: Goal: Ability to identify and develop effective coping behavior will improve Outcome: Progressing   Problem: Clinical Measurements: Goal: Quality of life will improve Outcome: Progressing   Problem: Respiratory: Goal: Verbalizations of increased ease of respirations will increase Outcome: Progressing   Problem: Role Relationship: Goal: Family's ability to cope with current situation will improve Outcome: Progressing Goal: Ability to verbalize concerns, feelings, and thoughts to partner or family member will improve Outcome: Progressing   Problem: Pain Management: Goal: Satisfaction with pain management regimen will improve Outcome: Progressing

## 2023-08-02 NOTE — Progress Notes (Signed)
 [facility]                                                                              Natalie Brown, is a 58 y.o. female, DOB - [birth year], [id number] Admit date - 01/22/2023    Outpatient Primary MD for the patient is [name], [name], NP  LOS - 142  days  Chief Complaint  Patient presents with   Agitation       Brief summary   Patient is a 58 year old female, past medical history significant of bipolar disorder with catatonia, hypertension, dementia, B12 deficiency and severe protein calorie malnutrition initially admitted to the hospital for agitation.  Patient was seen by neurology in the past as well and was thought to have neurodegenerative process.  Psychiatry team also followed the patient during hospitalization.  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  Recommend to continue lorazepam. Goal of care remains comfort care.  TOC following for disposition.   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  Currently on comfort care.  TOC following No dispo plans in place   Terminal care/full comfort care Has MOST form completed 12-19-2022. Comfort care.    CKD stage 3a, GFR 45-59 ml/min  - Comfort care  Essential hypertension -Not on any medications   Protein-calorie malnutrition, severe, underweight Continue nutritional supplements Etiology: chronic illness Signs/Symptoms: severe fat depletion, severe muscle depletion, energy intake < or equal to 50% for > or equal to 5 days Interventions: Magic cup  Estimated body mass index is 13.32 kg/m as calculated from the following:   Height as of this encounter: 5\' 6"  (1.676 m).   Weight as of this encounter: 37.4 kg.  Code Status:  DNR, comfort care DVT Prophylaxis:     Level of Care: Level of care: Med-Surg Family Communication:  Disposition Plan:      Remains inpatient appropriate: Awaiting disposition    Consultants:   Psychiatry Neurology  Antimicrobials:   Anti-infectives (From admission, onward)    None          Medications  feeding supplement  237 mL Oral BID BM   LORazepam  0.5 mg Oral TID      Subjective:   Natalie Brown was seen and examined today.  Alert, awake and smiling, nodded to her name but no other meaningful conversation.  Objective:   Vitals:   07/31/23 0757 08/01/23 0808 08/01/23 1647 08/02/23 0746  BP: 117/86 95/80 (!) 88/63 98/64  Pulse: 81 91 96 65  Resp:    18  Temp:  (!) 97.4 F (36.3 C) 98.1 F (36.7 C)   TempSrc:  Oral Oral   SpO2: 100%   99%  Weight:      Height:        Intake/Output Summary (Last 24 hours) at 08/02/2023 1245 Last data filed at 08/02/2023 1234 Gross per 24 hour  Intake  698 ml  Output --  Net 698 ml     Wt Readings from Last 3 Encounters:  07/17/23 37.4 kg  01/21/23 46 kg  12/24/21 72 kg    Data Reviewed:  I have personally reviewed following labs    CBC Lab Results  Component Value Date   WBC 5.4 07/08/2023   RBC 4.43 07/08/2023   HGB 13.2 07/08/2023   HCT 39.9 07/08/2023   MCV 90.1 07/08/2023   MCH 29.8 07/08/2023   PLT 293 07/08/2023   MCHC 33.1 07/08/2023   RDW 13.9 07/08/2023   LYMPHSABS 2.6 07/08/2023   MONOABS 0.4 07/08/2023   EOSABS 0.1 07/08/2023   BASOSABS 0.1 07/08/2023     Last metabolic panel Lab Results  Component Value Date   NA 142 07/08/2023   K 4.1 07/08/2023   CL 108 07/08/2023   CO2 25 07/08/2023   BUN 21 (H) 07/08/2023   CREATININE 1.03 (H) 07/08/2023   GLUCOSE 73 07/08/2023   GFRNONAA >60 07/08/2023   GFRAA 65 09/19/2017   CALCIUM 8.9 07/08/2023   PHOS 3.0 03/26/2023   PROT 6.7 07/08/2023   ALBUMIN 3.1 (L) 07/08/2023   LABGLOB 2.8 06/21/2020   AGRATIO 1.5 06/21/2020    BILITOT 0.5 07/08/2023   ALKPHOS 46 07/08/2023   AST 19 07/08/2023   ALT 15 07/08/2023   ANIONGAP 9 07/08/2023    CBG (last 3)  No results for input(s): "GLUCAP" in the last 72 hours.    Coagulation Profile: No results for input(s): "INR", "PROTIME" in the last 168 hours.   Radiology Studies: I have personally reviewed the imaging studies  No results found.     Bertram Brocks M.D. [facility] 08/02/2023, 12:45 PM  Available via Epic secure chat 7am-7pm After 7 pm, please refer to night coverage provider listed on amion.

## 2023-08-03 LAB — COMPREHENSIVE METABOLIC PANEL WITH GFR
ALT: 15 U/L (ref 0–44)
AST: 18 U/L (ref 15–41)
Albumin: 3.3 g/dL — ABNORMAL LOW (ref 3.5–5.0)
Alkaline Phosphatase: 40 U/L (ref 38–126)
Anion gap: 9 (ref 5–15)
BUN: 31 mg/dL — ABNORMAL HIGH (ref 6–20)
CO2: 25 mmol/L (ref 22–32)
Calcium: 9.3 mg/dL (ref 8.9–10.3)
Chloride: 113 mmol/L — ABNORMAL HIGH (ref 98–111)
Creatinine, Ser: 1.21 mg/dL — ABNORMAL HIGH (ref 0.44–1.00)
GFR, Estimated: 53 mL/min — ABNORMAL LOW (ref >60)
Glucose, Bld: 91 mg/dL (ref 70–99)
Potassium: 4.3 mmol/L (ref 3.5–5.1)
Sodium: 147 mmol/L — ABNORMAL HIGH (ref 135–145)
Total Bilirubin: 0.6 mg/dL (ref 0.0–1.2)
Total Protein: 7.2 g/dL (ref 6.5–8.1)

## 2023-08-03 LAB — CBC
HCT: 42.3 % (ref 36.0–46.0)
Hemoglobin: 13.8 g/dL (ref 12.0–15.0)
MCH: 30.1 pg (ref 26.0–34.0)
MCHC: 32.6 g/dL (ref 30.0–36.0)
MCV: 92.2 fL (ref 80.0–100.0)
Platelets: 271 10*3/uL (ref 150–400)
RBC: 4.59 MIL/uL (ref 3.87–5.11)
RDW: 13.9 % (ref 11.5–15.5)
WBC: 5.1 10*3/uL (ref 4.0–10.5)
nRBC: 0 % (ref 0.0–0.2)

## 2023-08-03 NOTE — Plan of Care (Signed)
  Problem: Safety: Goal: Non-violent Restraint(s) Outcome: Progressing   Problem: Education: Goal: Knowledge of the prescribed therapeutic regimen will improve Outcome: Progressing   Problem: Coping: Goal: Ability to identify and develop effective coping behavior will improve Outcome: Progressing   Problem: Clinical Measurements: Goal: Quality of life will improve Outcome: Progressing   Problem: Respiratory: Goal: Verbalizations of increased ease of respirations will increase Outcome: Progressing   Problem: Role Relationship: Goal: Family's ability to cope with current situation will improve Outcome: Progressing Goal: Ability to verbalize concerns, feelings, and thoughts to partner or family member will improve Outcome: Progressing   Problem: Pain Management: Goal: Satisfaction with pain management regimen will improve Outcome: Progressing

## 2023-08-03 NOTE — Progress Notes (Signed)
 [facility]                                                                              Natalie Brown, is a 58 y.o. female, DOB - [birth year], ZOX:[id number] Admit date - 01/22/2023    Outpatient Primary MD for the patient is [name], [name], NP  LOS - 143  days  Chief Complaint  Patient presents with   Agitation       Brief summary   Patient is a 58 year old female, past medical history significant of bipolar disorder with catatonia, hypertension, dementia, B12 deficiency and severe protein calorie malnutrition initially admitted to the hospital for agitation.  Patient was seen by neurology in the past as well and was thought to have neurodegenerative process.  Psychiatry team also followed the patient during hospitalization.  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  Recommend to continue lorazepam. Goal of care remains comfort care.  TOC following for disposition.   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  Currently on comfort care.  TOC following No dispo plans in place   Terminal care/full comfort care Has MOST form completed 12-19-2022. Comfort care.    CKD stage 3a, GFR 45-59 ml/min  - Comfort care  Essential hypertension -Not on any medications   Protein-calorie malnutrition, severe, underweight Continue nutritional supplements Etiology: chronic illness Signs/Symptoms: severe fat depletion, severe muscle depletion, energy intake < or equal to 50% for > or equal to 5 days Interventions: Magic cup  Estimated body mass index is 13.32 kg/m as calculated from the following:   Height as of this encounter: 5\' 6"  (1.676 m).   Weight as of this encounter: 37.4 kg.  Code Status:  DNR, comfort care DVT Prophylaxis:     Level of Care: Level of care: Med-Surg Family Communication:  Disposition Plan:      Remains inpatient appropriate: Awaiting disposition    Consultants:   Psychiatry Neurology  Antimicrobials:   Anti-infectives (From admission, onward)    None          Medications  feeding supplement  237 mL Oral BID BM   LORazepam  0.5 mg Oral TID      Subjective:   Natalie Brown was seen and examined today.  No acute issues overnight.  Nonverbal.  Objective:   Vitals:   08/02/23 0746 08/02/23 1707 08/03/23 0050 08/03/23 0809  BP: 98/64 90/63 91/73  (!) 100/58  Pulse: 65 100 77 64  Resp: 18 18 20 20   Temp:   (!) 97.3 F (36.3 C) 97.6 F (36.4 C)  TempSrc:   Oral Oral  SpO2: 99% 100% 100% 100%  Weight:      Height:        Intake/Output Summary (Last 24 hours) at 08/03/2023 1007 Last data filed at 08/03/2023 0800 Gross per 24 hour  Intake 340 ml  Output --  Net  340 ml     Wt Readings from Last 3 Encounters:  07/17/23 37.4 kg  01/21/23 46 kg  12/24/21 72 kg    Data Reviewed:  I have personally reviewed following labs    CBC Lab Results  Component Value Date   WBC 5.4 07/08/2023   RBC 4.43 07/08/2023   HGB 13.2 07/08/2023   HCT 39.9 07/08/2023   MCV 90.1 07/08/2023   MCH 29.8 07/08/2023   PLT 293 07/08/2023   MCHC 33.1 07/08/2023   RDW 13.9 07/08/2023   LYMPHSABS 2.6 07/08/2023   MONOABS 0.4 07/08/2023   EOSABS 0.1 07/08/2023   BASOSABS 0.1 07/08/2023     Last metabolic panel Lab Results  Component Value Date   NA 142 07/08/2023   K 4.1 07/08/2023   CL 108 07/08/2023   CO2 25 07/08/2023   BUN 21 (H) 07/08/2023   CREATININE 1.03 (H) 07/08/2023   GLUCOSE 73 07/08/2023   GFRNONAA >60 07/08/2023   GFRAA 65 09/19/2017   CALCIUM 8.9 07/08/2023   PHOS 3.0 03/26/2023   PROT 6.7 07/08/2023   ALBUMIN 3.1 (L) 07/08/2023   LABGLOB 2.8 06/21/2020   AGRATIO 1.5 06/21/2020   BILITOT 0.5 07/08/2023   ALKPHOS  46 07/08/2023   AST 19 07/08/2023   ALT 15 07/08/2023   ANIONGAP 9 07/08/2023    CBG (last 3)  No results for input(s): "GLUCAP" in the last 72 hours.    Coagulation Profile: No results for input(s): "INR", "PROTIME" in the last 168 hours.   Radiology Studies: I have personally reviewed the imaging studies  No results found.     Bertram Brocks M.D. [facility] 08/03/2023, 10:07 AM  Available via Epic secure chat 7am-7pm After 7 pm, please refer to night coverage provider listed on amion.

## 2023-08-04 NOTE — Plan of Care (Signed)
  Problem: Safety: Goal: Non-violent Restraint(s) Outcome: Progressing   Problem: Education: Goal: Knowledge of the prescribed therapeutic regimen will improve Outcome: Progressing   Problem: Coping: Goal: Ability to identify and develop effective coping behavior will improve Outcome: Progressing   Problem: Clinical Measurements: Goal: Quality of life will improve Outcome: Progressing   Problem: Respiratory: Goal: Verbalizations of increased ease of respirations will increase Outcome: Progressing   Problem: Role Relationship: Goal: Family's ability to cope with current situation will improve Outcome: Progressing Goal: Ability to verbalize concerns, feelings, and thoughts to partner or family member will improve Outcome: Progressing   Problem: Pain Management: Goal: Satisfaction with pain management regimen will improve Outcome: Progressing

## 2023-08-04 NOTE — Progress Notes (Signed)
 [facility]                                                                              Natalie Brown, is a 58 y.o. female, DOB - [birth year], [id number] Admit date - 01/22/2023    Outpatient Primary MD for the patient is [name], [name], NP  LOS - 144  days  Chief Complaint  Patient presents with   Agitation       Brief summary   Patient is a 58 year old female, past medical history significant of bipolar disorder with catatonia, hypertension, dementia, B12 deficiency and severe protein calorie malnutrition initially admitted to the hospital for agitation.  Patient was seen by neurology in the past as well and was thought to have neurodegenerative process.  Psychiatry team also followed the patient during hospitalization.  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  Recommend to continue lorazepam. Goal of care remains comfort care.  TOC following for disposition.  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  Currently on comfort care.  TOC following No dispo plans in place   Terminal care/full comfort care Has MOST form completed 12-19-2022. Comfort care.    CKD stage 3a, GFR 45-59 ml/min  - Comfort care  Essential hypertension -Not on any medications   Protein-calorie malnutrition, severe, underweight Continue nutritional supplements Etiology: chronic illness Signs/Symptoms: severe fat depletion, severe muscle depletion, energy intake < or equal to 50% for > or equal to 5 days Interventions: Magic cup  Estimated body mass index is 13.32 kg/m as calculated from the following:   Height as of this encounter: 5\' 6"  (1.676 m).   Weight as of this encounter: 37.4  kg.  Code Status: DNR, comfort care DVT Prophylaxis:     Level of Care: Level of care: Med-Surg Family Communication:  Disposition Plan:      Remains inpatient appropriate: Awaiting disposition    Consultants:   Psychiatry Neurology  Antimicrobials:   Anti-infectives (From admission, onward)    None          Medications  feeding supplement  237 mL Oral BID BM   LORazepam  0.5 mg Oral TID      Subjective:   Natalie Brown was seen and examined today.  Nonverbal, resting comfortably, no acute issues   Objective:   Vitals:   08/03/23 0050 08/03/23 0809 08/04/23 0552 08/04/23 0851  BP: 91/73 (!) 100/58 109/62 116/69  Pulse: 77 64 84 71  Resp: 20 20 20    Temp: (!) 97.3 F (36.3 C) 97.6 F (36.4 C)    TempSrc: Oral Oral    SpO2: 100% 100%  98%  Weight:      Height:       No intake or output data in the 24 hours ending 08/04/23 1107    Wt Readings from Last 3 Encounters:  07/17/23 37.4 kg  01/21/23 46 kg  12/24/21 72 kg    Data Reviewed:  I have personally reviewed following labs    CBC Lab Results  Component Value Date   WBC 5.1 08/03/2023   RBC 4.59 08/03/2023   HGB 13.8 08/03/2023   HCT 42.3 08/03/2023   MCV 92.2 08/03/2023   MCH 30.1 08/03/2023   PLT 271 08/03/2023   MCHC 32.6 08/03/2023   RDW 13.9 08/03/2023   LYMPHSABS 2.6 07/08/2023   MONOABS 0.4 07/08/2023   EOSABS 0.1 07/08/2023   BASOSABS 0.1 07/08/2023     Last metabolic panel Lab Results  Component Value Date   NA 147 (H) 08/03/2023   K 4.3 08/03/2023   CL 113 (H) 08/03/2023   CO2 25 08/03/2023   BUN 31 (H) 08/03/2023   CREATININE 1.21 (H) 08/03/2023   GLUCOSE 91 08/03/2023   GFRNONAA 53 (L) 08/03/2023   GFRAA 65 09/19/2017   CALCIUM 9.3 08/03/2023   PHOS 3.0 03/26/2023   PROT 7.2 08/03/2023   ALBUMIN 3.3 (L) 08/03/2023   LABGLOB 2.8 06/21/2020   AGRATIO 1.5 06/21/2020   BILITOT 0.6 08/03/2023   ALKPHOS 40 08/03/2023   AST 18 08/03/2023   ALT 15 08/03/2023    ANIONGAP 9 08/03/2023    CBG (last 3)  No results for input(s): "GLUCAP" in the last 72 hours.    Coagulation Profile: No results for input(s): "INR", "PROTIME" in the last 168 hours.   Radiology Studies: I have personally reviewed the imaging studies  No results found.     Bertram Brocks M.D. [facility] 08/04/2023, 11:07 AM  Available via Epic secure chat 7am-7pm After 7 pm, please refer to night coverage provider listed on amion.

## 2023-08-05 NOTE — Plan of Care (Signed)
  Problem: Safety: Goal: Non-violent Restraint(s) Outcome: Progressing   Problem: Education: Goal: Knowledge of the prescribed therapeutic regimen will improve Outcome: Progressing   Problem: Coping: Goal: Ability to identify and develop effective coping behavior will improve Outcome: Progressing   Problem: Clinical Measurements: Goal: Quality of life will improve Outcome: Progressing   Problem: Respiratory: Goal: Verbalizations of increased ease of respirations will increase Outcome: Progressing   Problem: Role Relationship: Goal: Family's ability to cope with current situation will improve Outcome: Progressing Goal: Ability to verbalize concerns, feelings, and thoughts to partner or family member will improve Outcome: Progressing   Problem: Pain Management: Goal: Satisfaction with pain management regimen will improve Outcome: Progressing   Problem: Safety: Goal: Non-violent Restraint(s) Outcome: Progressing   Problem: Education: Goal: Knowledge of the prescribed therapeutic regimen will improve Outcome: Progressing   Problem: Coping: Goal: Ability to identify and develop effective coping behavior will improve Outcome: Progressing   Problem: Clinical Measurements: Goal: Quality of life will improve Outcome: Progressing   Problem: Respiratory: Goal: Verbalizations of increased ease of respirations will increase Outcome: Progressing   Problem: Role Relationship: Goal: Family's ability to cope with current situation will improve Outcome: Progressing Goal: Ability to verbalize concerns, feelings, and thoughts to partner or family member will improve Outcome: Progressing   Problem: Pain Management: Goal: Satisfaction with pain management regimen will improve Outcome: Progressing

## 2023-08-05 NOTE — Progress Notes (Signed)
 [facility]                                                                              Natalie Brown, is a 58 y.o. female, DOB - Jun 09, 1965, ONG:[id number] Admit date - 01/22/2023    Outpatient Primary MD for the patient is [name], [name], NP  LOS - 145  days  Chief Complaint  Patient presents with   Agitation       Brief summary   Patient is a 58 year old female, past medical history significant of bipolar disorder with catatonia, hypertension, dementia, B12 deficiency and severe protein calorie malnutrition initially admitted to the hospital for agitation.  Patient was seen by neurology in the past as well and was thought to have neurodegenerative process.  Psychiatry team also followed the patient during hospitalization.  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  Recommend to continue lorazepam. Goal of care remains comfort care.  TOC following for disposition.  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  Currently on comfort care.  TOC following No dispo plans in place   Terminal care/full comfort care Has MOST form completed 12-19-2022. Comfort care.    CKD stage 3a, GFR 45-59 ml/min  - Comfort care  Essential hypertension -Not on any medications   Protein-calorie malnutrition, severe, underweight Continue nutritional supplements Etiology: chronic illness Signs/Symptoms: severe fat depletion, severe muscle depletion, energy intake < or equal to 50% for > or equal to 5 days Interventions: Magic cup  Estimated body mass index is 13.32 kg/m as calculated from the following:   Height as of this encounter: 5\' 6"  (1.676 m).   Weight as of this encounter: 37.4  kg.  Code Status: DNR, comfort care DVT Prophylaxis:     Level of Care: Level of care: Med-Surg Family Communication:  Disposition Plan:      Remains inpatient appropriate: Awaiting disposition    Consultants:   Psychiatry Neurology  Antimicrobials:   Anti-infectives (From admission, onward)    None          Medications  feeding supplement  237 mL Oral BID BM   LORazepam  0.5 mg Oral TID      Subjective:   Natalie Brown was seen and examined today.  No acute issues overnight, nonverbal, resting comfortably.    Objective:   Vitals:   08/03/23 0809 08/04/23 0552 08/04/23 0851 08/05/23 0855  BP: (!) 100/58 109/62 116/69 104/80  Pulse: 64 84 71 88  Resp: 20 20    Temp: 97.6 F (36.4 C)     TempSrc: Oral     SpO2: 100%  98%   Weight:      Height:        Intake/Output Summary (Last 24 hours) at 08/05/2023 1014 Last data filed at 08/05/2023 1000 Gross per 24 hour  Intake 236 ml  Output --  Net 236 ml      Wt Readings from Last 3 Encounters:  07/17/23 37.4 kg  01/21/23 46 kg  12/24/21 72 kg    Data Reviewed:  I have personally reviewed following labs    CBC Lab Results  Component Value Date   WBC 5.1 08/03/2023   RBC 4.59 08/03/2023   HGB 13.8 08/03/2023   HCT 42.3 08/03/2023   MCV 92.2 08/03/2023   MCH 30.1 08/03/2023   PLT 271 08/03/2023   MCHC 32.6 08/03/2023   RDW 13.9 08/03/2023   LYMPHSABS 2.6 07/08/2023   MONOABS 0.4 07/08/2023   EOSABS 0.1 07/08/2023   BASOSABS 0.1 07/08/2023     Last metabolic panel Lab Results  Component Value Date   NA 147 (H) 08/03/2023   K 4.3 08/03/2023   CL 113 (H) 08/03/2023   CO2 25 08/03/2023   BUN 31 (H) 08/03/2023   CREATININE 1.21 (H) 08/03/2023   GLUCOSE 91 08/03/2023   GFRNONAA 53 (L) 08/03/2023   GFRAA 65 09/19/2017   CALCIUM 9.3 08/03/2023   PHOS 3.0 03/26/2023   PROT 7.2 08/03/2023   ALBUMIN 3.3 (L) 08/03/2023   LABGLOB 2.8 06/21/2020   AGRATIO 1.5 06/21/2020   BILITOT 0.6  08/03/2023   ALKPHOS 40 08/03/2023   AST 18 08/03/2023   ALT 15 08/03/2023   ANIONGAP 9 08/03/2023    CBG (last 3)  No results for input(s): "GLUCAP" in the last 72 hours.    Coagulation Profile: No results for input(s): "INR", "PROTIME" in the last 168 hours.   Radiology Studies: I have personally reviewed the imaging studies  No results found.     Bertram Brocks M.D. [facility] 08/05/2023, 10:14 AM  Available via Epic secure chat 7am-7pm After 7 pm, please refer to night coverage provider listed on amion.

## 2023-08-05 NOTE — Plan of Care (Signed)
  Problem: Safety: Goal: Non-violent Restraint(s) Outcome: Progressing   Problem: Education: Goal: Knowledge of the prescribed therapeutic regimen will improve Outcome: Progressing   Problem: Coping: Goal: Ability to identify and develop effective coping behavior will improve Outcome: Progressing   Problem: Clinical Measurements: Goal: Quality of life will improve Outcome: Progressing   Problem: Respiratory: Goal: Verbalizations of increased ease of respirations will increase Outcome: Progressing   Problem: Role Relationship: Goal: Family's ability to cope with current situation will improve Outcome: Progressing Goal: Ability to verbalize concerns, feelings, and thoughts to partner or family member will improve Outcome: Progressing   Problem: Pain Management: Goal: Satisfaction with pain management regimen will improve Outcome: Progressing

## 2023-08-06 DIAGNOSIS — R410 Disorientation, unspecified: Secondary | ICD-10-CM

## 2023-08-06 NOTE — Progress Notes (Signed)
 PROGRESS NOTE    Natalie Brown  WUJ:[id number] DOB: January 18, 1966 DOA: 01/22/2023 PCP: Lavinia Sharps, NP   Brief Narrative:  Natalie Brown is 58 year old female, past medical history significant of bipolar disorder with catatonia, hypertension, dementia, B12 deficiency and severe protein calorie malnutrition initially admitted to the hospital for agitation.  Patient was seen by neurology in the past as well and was thought to have neurodegenerative process.  Psychiatry team also followed the patient during hospitalization.  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  Recommend to continue lorazepam.  Goal of care remains comfort care, verified with palliative care as well as psychiatry. 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 Currently on comfort care.  TOC following No dispo plans in place   Terminal Care/full Comfort Care: Has MOST form completed 12-19-2022. Comfort care.    CKD stage 3a, GFR 45-59 ml/min:  Comfort care   Essential Hypertension: Not on any medications   Protein-calorie malnutrition, severe, underweight Nutrition Status: Nutrition Problem: Severe Malnutrition Etiology: chronic illness Signs/Symptoms: severe fat depletion, severe muscle depletion, energy intake < or equal to 50% for > or equal to 5 days Interventions: Magic cup Estimated body mass index is 13.32 kg/m as calculated from the following:   Height as of this encounter: 5\' 6"  (1.676 m).   Weight as of this encounter: 37.4 kg.  Hypoalbuminemia: Albumin level is now 3.3. Will not CTM and Trend and repeat CMP given that she is Comfort Care   DVT prophylaxis: None as she is Comfort Care    Code Status:  Limited: Do not attempt resuscitation (DNR) -DNR-LIMITED -Do Not Intubate/DNI  Family Communication: No family present at bedside   Disposition Plan:  Level of care: Med-Surg Status is: Inpatient Remains inpatient appropriate because: Continues to await Disposition  Consultants:  Psychiatry Palliative Care Medicine   Procedures:  As delineated as above  Antimicrobials:  Anti-infectives (From admission, onward)    None       Subjective: Seen and examined at bedside and continues to be nonverbal and resting.  Did not interact with me but nursing states that she ambulated the halls 3 times yesterday.  Objective: Vitals:   08/05/23 0855 08/05/23 1448 08/06/23 0752 08/06/23 1340  BP: 104/80 92/69 (!) 152/108 110/73  Pulse: 88 90  90  Resp:  17 17 18   Temp:   98.4 F (36.9 C) 98.4 F (36.9 C)  TempSrc:   Oral Oral  SpO2:      Weight:      Height:       No intake or output data in the 24 hours ending 08/06/23 1843 Filed Weights   06/02/23 0500 07/02/23 1700 07/17/23 1531  Weight: 42 kg 42.2 kg 37.4 kg   Examination: Physical Exam:  Constitutional: Chronically ill-appearing African-American female who remains catatonic Respiratory: Diminished to auscultation bilaterally, no wheezing, rales, rhonchi or crackles. Normal respiratory effort and patient is not tachypenic. No accessory muscle use.  Unlabored breathing Cardiovascular: RRR, no murmurs / rubs / gallops. S1 and S2 auscultated. No extremity edema.  Abdomen: Soft, non-tender, non-distended.  Bowel sounds positive.  GU: Deferred. Skin: No rashes, lesions, ulcers on limited skin evaluation. No induration; Warm and  dry.  Neurologic: Does not interact is catatonic but moves extremities independently and starts flailing a little bit when I try to examine her Psychiatric: Impaired judgment and insight  Data Reviewed: I have personally reviewed following labs and imaging studies  CBC: Recent Labs  Lab 08/03/23 1104   WBC 5.1  HGB 13.8  HCT 42.3  MCV 92.2  PLT 271   Basic Metabolic Panel: Recent Labs  Lab 08/03/23 1104  NA 147*  K 4.3  CL 113*  CO2 25  GLUCOSE 91  BUN 31*  CREATININE 1.21*  CALCIUM 9.3   GFR: Estimated Creatinine Clearance: 30.7 mL/min (A) (by C-G formula based on SCr of 1.21 mg/dL (H)). Liver Function Tests: Recent Labs  Lab 08/03/23 1104  AST 18  ALT 15  ALKPHOS 40  BILITOT 0.6  PROT 7.2  ALBUMIN 3.3*   No results for input(s): "LIPASE", "AMYLASE" in the last 168 hours. No results for input(s): "AMMONIA" in the last 168 hours. Coagulation Profile: No results for input(s): "INR", "PROTIME" in the last 168 hours. Cardiac Enzymes: No results for input(s): "CKTOTAL", "CKMB", "CKMBINDEX", "TROPONINI" in the last 168 hours. BNP (last 3 results) No results for input(s): "PROBNP" in the last 8760 hours. HbA1C: No results for input(s): "HGBA1C" in the last 72 hours. CBG: No results for input(s): "GLUCAP" in the last 168 hours. Lipid Profile: No results for input(s): "CHOL", "HDL", "LDLCALC", "TRIG", "CHOLHDL", "LDLDIRECT" in the last 72 hours. Thyroid Function Tests: No results for input(s): "TSH", "T4TOTAL", "FREET4", "T3FREE", "THYROIDAB" in the last 72 hours. Anemia Panel: No results for input(s): "VITAMINB12", "FOLATE", "FERRITIN", "TIBC", "IRON", "RETICCTPCT" in the last 72 hours. Sepsis Labs: No results for input(s): "PROCALCITON", "LATICACIDVEN" in the last 168 hours.  No results found for this or any previous visit (from the past 240 hours).   Radiology Studies: No results found.  Scheduled Meds:  feeding supplement  237 mL Oral BID BM   LORazepam  0.5 mg Oral TID   Continuous Infusions:   LOS: 146 days   [name], DO [facility] Available via Epic secure chat 7am-7pm After these hours, please refer to coverage provider listed on [email or web address] 08/06/2023, 6:43 PM

## 2023-08-07 NOTE — TOC Progression Note (Signed)
 Transition of Care [facility]) - Progression Note    Patient Details  Name: Natalie Brown MRN: [id number] Date of Birth: [birth year]  Transition of [facility]) CM/SW Contact  Fayth Trefry A [place], LCSW Phone Number: 08/07/2023, 9:24 AM  Clinical Narrative:      Pt has no bed offers for placement currently, placement options have been exhausted at this time.    TOC will continue to follow.       Expected Discharge Plan and Services                                               Social Determinants of Health (SDOH) Interventions SDOH Screenings   Food Insecurity: No Food Insecurity (05/14/2023)  Housing: Low Risk  (01/24/2023)  Transportation Needs: No Transportation Needs (05/14/2023)  Utilities: Not At Risk (05/14/2023)  Alcohol Screen: Low Risk  (09/06/2021)  Depression (PHQ2-9): Medium Risk (05/26/2020)  Tobacco Use: High Risk (04/04/2023)    Readmission Risk Interventions     No data to display

## 2023-08-07 NOTE — Plan of Care (Signed)
  Problem: Safety: Goal: Non-violent Restraint(s) Outcome: Progressing   Problem: Education: Goal: Knowledge of the prescribed therapeutic regimen will improve Outcome: Progressing   Problem: Coping: Goal: Ability to identify and develop effective coping behavior will improve Outcome: Progressing   Problem: Clinical Measurements: Goal: Quality of life will improve Outcome: Progressing   Problem: Respiratory: Goal: Verbalizations of increased ease of respirations will increase Outcome: Progressing   Problem: Role Relationship: Goal: Family's ability to cope with current situation will improve Outcome: Progressing Goal: Ability to verbalize concerns, feelings, and thoughts to partner or family member will improve Outcome: Progressing   Problem: Pain Management: Goal: Satisfaction with pain management regimen will improve Outcome: Progressing

## 2023-08-07 NOTE — Progress Notes (Signed)
 PROGRESS NOTE    Natalie Brown  ZOX:[id number] DOB: [birth year] DOA: 01/22/2023 PCP: Lavinia Sharps, NP   Brief Narrative:  Natalie Brown is 58 year old female, past medical history significant of bipolar disorder with catatonia, hypertension, dementia, B12 deficiency and severe protein calorie malnutrition initially admitted to the hospital for agitation.  Patient was seen by neurology in the past as well and was thought to have neurodegenerative process.  Psychiatry team also followed the patient during hospitalization.  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  Recommend to continue lorazepam.  Goal of care remains comfort care, verified with palliative care as well as psychiatry. 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 Currently on comfort care.  TOC following No dispo plans in place   Terminal Care/full Comfort Care: Has MOST form completed 12-19-2022. Comfort care.    CKD stage 3a, GFR 45-59 ml/min:  Comfort care   Essential Hypertension: Not on any medications   Protein-calorie malnutrition, severe, underweight Nutrition Status: Nutrition Problem: Severe Malnutrition Etiology: chronic illness Signs/Symptoms: severe fat depletion, severe muscle depletion, energy intake < or equal to 50% for > or equal to 5 days Interventions: Magic cup Estimated body mass index is 13.32 kg/m as calculated from the following:   Height as of this encounter: 5\' 6"  (1.676 m).   Weight as of this encounter: 37.4 kg.  Hypoalbuminemia: Albumin level was 3.3 on last check. Will not CTM and Trend and repeat CMP given that she is Comfort Care   DVT prophylaxis: None as patient Comfort Care     Code Status: Limited: Do not attempt resuscitation (DNR) -DNR-LIMITED -Do Not Intubate/DNI  Family Communication: No family present at bedside  Disposition Plan:  Level of care: Med-Surg Status is: Inpatient Remains inpatient appropriate because: Continues to await Disposition but has no bed offers for placement currently  Consultants:  Psychiatry Palliative Care Medicine   Procedures:  As delineated as above  Antimicrobials:  Anti-infectives (From admission, onward)    None       Subjective: Seen and examined at bedside and she continues to be nonverbal and resting.  Did not interact with me today.  Objective: Vitals:   08/05/23 1448 08/06/23 0752 08/06/23 1340 08/06/23 2000  BP: 92/69 (!) 152/108 110/73 133/81  Pulse: 90  90   Resp: 17 17 18 18   Temp:  98.4 F (36.9 C) 98.4 F (36.9 C) 97.6 F (36.4 C)  TempSrc:  Oral Oral Axillary  SpO2:      Weight:      Height:        Intake/Output Summary (Last 24 hours) at 08/07/2023 1254 Last data filed at 08/07/2023 1203 Gross per 24 hour  Intake 360 ml  Output --  Net 360 ml   Filed Weights   06/02/23 0500 07/02/23 1700 07/17/23 1531  Weight: 42 kg 42.2 kg 37.4 kg   Examination: Physical Exam:  Constitutional: Chronically ill-appearing African-American female who remains catatonic and unable to interact with me today Respiratory: Diminished to auscultation bilaterally, no wheezing, rales, rhonchi or crackles. Normal respiratory effort and patient is not tachypenic. No accessory muscle use.  Unlabored breathing Cardiovascular: Slightly tachycardic but regular rhythm, no murmurs / rubs /  gallops. S1 and S2 auscultated. No extremity edema. 2+ pedal pulses. No carotid bruits.  Abdomen: Soft, non-tender, non-distended. Bowel sounds positive.  GU: Deferred. Musculoskeletal: No clubbing / cyanosis of digits/nails. No joint deformity upper and lower extremities.  Skin: No rashes, lesions, ulcers on limited skin evaluation.  No induration; Warm and dry.  Neurologic: Continues to not interact and moves her extremities independently Psychiatric: Impaired judgment and insight  Data Reviewed: I have personally reviewed following labs and imaging studies  CBC: Recent Labs  Lab 08/03/23 1104  WBC 5.1  HGB 13.8  HCT 42.3  MCV 92.2  PLT 271   Basic Metabolic Panel: Recent Labs  Lab 08/03/23 1104  NA 147*  K 4.3  CL 113*  CO2 25  GLUCOSE 91  BUN 31*  CREATININE 1.21*  CALCIUM 9.3   GFR: Estimated Creatinine Clearance: 30.7 mL/min (A) (by C-G formula based on SCr of 1.21 mg/dL (H)). Liver Function Tests: Recent Labs  Lab 08/03/23 1104  AST 18  ALT 15  ALKPHOS 40  BILITOT 0.6  PROT 7.2  ALBUMIN 3.3*   No results for input(s): "LIPASE", "AMYLASE" in the last 168 hours. No results for input(s): "AMMONIA" in the last 168 hours. Coagulation Profile: No results for input(s): "INR", "PROTIME" in the last 168 hours. Cardiac Enzymes: No results for input(s): "CKTOTAL", "CKMB", "CKMBINDEX", "TROPONINI" in the last 168 hours. BNP (last 3 results) No results for input(s): "PROBNP" in the last 8760 hours. HbA1C: No results for input(s): "HGBA1C" in the last 72 hours. CBG: No results for input(s): "GLUCAP" in the last 168 hours. Lipid Profile: No results for input(s): "CHOL", "HDL", "LDLCALC", "TRIG", "CHOLHDL", "LDLDIRECT" in the last 72 hours. Thyroid Function Tests: No results for input(s): "TSH", "T4TOTAL", "FREET4", "T3FREE", "THYROIDAB" in the last 72 hours. Anemia Panel: No results for input(s): "VITAMINB12", "FOLATE", "FERRITIN", "TIBC", "IRON", "RETICCTPCT" in the last 72 hours. Sepsis Labs: No results for input(s): "PROCALCITON", "LATICACIDVEN" in the last 168 hours.  No results found for this or any previous visit (from the past 240 hours).   Radiology Studies: No results found.  Scheduled Meds:  feeding supplement  237 mL Oral BID BM   LORazepam  0.5 mg Oral TID   Continuous  Infusions:   LOS: 147 days   [name], DO [facility] Available via Epic secure chat 7am-7pm After these hours, please refer to coverage provider listed on [email or web address] 08/07/2023, 12:54 PM

## 2023-08-08 NOTE — Progress Notes (Signed)
 PROGRESS NOTE    Natalie Brown  WUJ:[id number] DOB: 11/17/1965 DOA: 01/22/2023 PCP: Natalie Elizabeth, NP   Brief Narrative:  Natalie Brown. Natalie Brown is 58 year old female, past medical history significant of bipolar disorder with catatonia, hypertension, dementia, B12 deficiency and severe protein calorie malnutrition initially admitted to the hospital for agitation.  Patient was seen by neurology in the past as well and was thought to have neurodegenerative process.  Psychiatry team also followed the patient during hospitalization.  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  Recommend to continue lorazepam .  Goal of care remains comfort care, verified with palliative care as well as psychiatry. 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 Currently on comfort care.  TOC following No dispo plans in place   Terminal Care/full Comfort Care: Has MOST form completed 12-19-2022. Comfort care.    CKD stage 3a, GFR 45-59 ml/min:  Comfort care   Essential Hypertension: Not on any medications   Protein-calorie malnutrition, severe, underweight Nutrition Status: Nutrition Problem: Severe Malnutrition Etiology: chronic illness Signs/Symptoms: severe fat depletion, severe muscle depletion, energy intake < or equal to 50% for > or equal to 5 days Interventions: Magic cup Estimated body mass index is 13.32 kg/m as calculated from the following:   Height as of this encounter: 5\' 6"  (1.676 m).   Weight as of this encounter: 37.4 kg.  Hypoalbuminemia: Albumin  level was 3.3 on last check. Will not CTM and Trend and repeat CMP given that she is Comfort Care   DVT prophylaxis: None as patient is Comfort Care     Code Status: Limited: Do not attempt resuscitation (DNR) -DNR-LIMITED -Do Not Intubate/DNI  Family Communication: No family present at bedside  Disposition Plan:  Level of care: Med-Surg Status is: Inpatient Remains inpatient appropriate because: Continues to await Disposition but has no bed offers for placement currently  Consultants:  Psychiatry Palliative Care Medicine   Procedures:  As delineated as above  Antimicrobials:  Anti-infectives (From admission, onward)    None       Subjective: Seen and examined at bedside and she continues to be nonverbal and resting. Again does not interact with me today.   Objective: Vitals:   08/06/23 1340 08/06/23 2000 08/07/23 1620 08/07/23 2023  BP: 110/73 133/81 (!) 127/94 95/60  Pulse: 90  (!) 120 97  Resp: 18 18  18   Temp: 98.4 F (36.9 C) 97.6 F (36.4 C)  (!) 97.5 F (36.4 C)  TempSrc: Oral Axillary  Axillary  SpO2:   97% 100%  Weight:      Height:        Intake/Output Summary (Last 24 hours) at 08/08/2023 1144 Last data filed at 08/08/2023 0941 Gross per 24 hour  Intake 954 ml  Output --  Net 954 ml   Filed Weights   06/02/23 0500 07/02/23 1700 07/17/23 1531  Weight: 42 kg 42.2 kg 37.4 kg   Examination: Physical Exam:  Constitutional: Chronically ill-appearing frail and thin AAF who remains catatonic and does not interact again Respiratory: Diminished to auscultation bilaterally, no wheezing, rales, rhonchi or crackles. Normal respiratory effort and patient is not tachypenic. No accessory muscle use. Unlabored breathing  Cardiovascular: RRR, no murmurs / rubs /  gallops. S1 and S2 auscultated. No extremity edema. Abdomen: Soft, non-tender, non-distended. Bowel sounds positive.  GU: Deferred. Musculoskeletal: No clubbing / cyanosis of digits/nails. No joint deformity in the upper and lower extremities. Skin: No rashes, lesions, ulcers. No induration; Warm and dry.  Neurologic: Does not follow commands.   Psychiatric: Impaired judgement and insight   Data Reviewed: I have personally reviewed following labs and imaging studies  CBC: Recent Labs  Lab 08/03/23 1104  WBC 5.1  HGB 13.8  HCT 42.3  MCV 92.2  PLT 271   Basic Metabolic Panel: Recent Labs  Lab 08/03/23 1104  NA 147*  K 4.3  CL 113*  CO2 25  GLUCOSE 91  BUN 31*  CREATININE 1.21*  CALCIUM  9.3   GFR: Estimated Creatinine Clearance: 30.7 mL/min (A) (by C-G formula based on SCr of 1.21 mg/dL (H)). Liver Function Tests: Recent Labs  Lab 08/03/23 1104  AST 18  ALT 15  ALKPHOS 40  BILITOT 0.6  PROT 7.2  ALBUMIN  3.3*   No results for input(s): "LIPASE", "AMYLASE" in the last 168 hours. No results for input(s): "AMMONIA" in the last 168 hours. Coagulation Profile: No results for input(s): "INR", "PROTIME" in the last 168 hours. Cardiac Enzymes: No results for input(s): "CKTOTAL", "CKMB", "CKMBINDEX", "TROPONINI" in the last 168 hours. BNP (last 3 results) No results for input(s): "PROBNP" in the last 8760 hours. HbA1C: No results for input(s): "HGBA1C" in the last 72 hours. CBG: No results for input(s): "GLUCAP" in the last 168 hours. Lipid Profile: No results for input(s): "CHOL", "HDL", "LDLCALC", "TRIG", "CHOLHDL", "LDLDIRECT" in the last 72 hours. Thyroid Function Tests: No results for input(s): "TSH", "T4TOTAL", "FREET4", "T3FREE", "THYROIDAB" in the last 72 hours. Anemia Panel: No results for input(s): "VITAMINB12", "FOLATE", "FERRITIN", "TIBC", "IRON", "RETICCTPCT" in the last 72 hours. Sepsis Labs: No results for input(s): "PROCALCITON", "LATICACIDVEN" in the last 168 hours.  No results found for this or any previous visit (from the past 240 hours).   Radiology Studies: No results found.  Scheduled Meds:  feeding supplement  237 mL Oral BID BM   LORazepam   0.5 mg Oral TID   Continuous Infusions:   LOS: 148 days   Natalie Leeds, DO [facility] Available via Epic secure chat  7am-7pm After these hours, please refer to coverage provider listed on [email or web address] 08/08/2023, 11:44 AM

## 2023-08-08 NOTE — Plan of Care (Signed)
  Problem: Safety: Goal: Non-violent Restraint(s) Outcome: Progressing   Problem: Education: Goal: Knowledge of the prescribed therapeutic regimen will improve Outcome: Progressing   Problem: Coping: Goal: Ability to identify and develop effective coping behavior will improve Outcome: Progressing   Problem: Clinical Measurements: Goal: Quality of life will improve Outcome: Progressing   Problem: Respiratory: Goal: Verbalizations of increased ease of respirations will increase Outcome: Progressing   Problem: Role Relationship: Goal: Family's ability to cope with current situation will improve Outcome: Progressing Goal: Ability to verbalize concerns, feelings, and thoughts to partner or family member will improve Outcome: Progressing   Problem: Pain Management: Goal: Satisfaction with pain management regimen will improve Outcome: Progressing

## 2023-08-09 NOTE — Plan of Care (Signed)
  Problem: Safety: Goal: Non-violent Restraint(s) Outcome: Not Progressing   Problem: Education: Goal: Knowledge of the prescribed therapeutic regimen will improve Outcome: Not Progressing   Problem: Coping: Goal: Ability to identify and develop effective coping behavior will improve Outcome: Not Progressing   Problem: Clinical Measurements: Goal: Quality of life will improve Outcome: Not Progressing   Problem: Role Relationship: Goal: Family's ability to cope with current situation will improve Outcome: Not Progressing

## 2023-08-09 NOTE — Progress Notes (Signed)
 PROGRESS NOTE    Natalie Brown  ZOX:[id number] DOB: 21-Dec-1965 DOA: 01/22/2023 PCP: Natalie Elizabeth, NP   Brief Narrative:  Natalie Brown is 58 year old female, past medical history significant of bipolar disorder with catatonia, hypertension, dementia, B12 deficiency and severe protein calorie malnutrition initially admitted to the hospital for agitation.  Patient was seen by neurology in the past as well and was thought to have neurodegenerative process.  Psychiatry team also followed the patient during hospitalization.  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  Recommend to continue lorazepam .  Goal of care remains comfort care, verified with palliative care as well as psychiatry. 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 Currently on comfort care.  TOC following No dispo plans in place   Terminal Care/full Comfort Care: Has MOST form completed 12-19-2022. Comfort care.    CKD stage 3a, GFR 45-59 ml/min:  Comfort care   Essential Hypertension: Not on any medications   Protein-calorie malnutrition, severe, underweight Nutrition Status: Nutrition Problem: Severe Malnutrition Etiology: chronic illness Signs/Symptoms: severe fat depletion, severe muscle depletion, energy intake < or equal to 50% for > or equal to 5 days Interventions: Magic cup Estimated body mass index is 13.32 kg/m as calculated from the following:   Height as of this encounter: 5\' 6"  (1.676 m).   Weight as of this encounter: 37.4 kg.  Hypoalbuminemia: Albumin  level was 3.3 on last check. Will not CTM and Trend and repeat CMP given that she is Comfort Care   DVT prophylaxis: None as patient is Comfort Care     Code Status: Limited: Do not attempt resuscitation (DNR) -DNR-LIMITED -Do Not Intubate/DNI  Family Communication: No family present at bedside   Disposition Plan:  Level of care: Med-Surg Status is: Inpatient Remains inpatient appropriate because: Continues to await Disposition but has no bed offers for placement currently    Consultants:  Psychiatry Palliative Care Medicine   Procedures:  As delineated as above  Antimicrobials:  Anti-infectives (From admission, onward)    None       Subjective: Seen and examined at bedside and she is nonverbal and is more agitated and is thrashing around in the bed.  Does not interact with me again and is not sitting still.  Objective: Vitals:   08/06/23 2000 08/07/23 1620 08/07/23 2023 08/09/23 0016  BP: 133/81 (!) 127/94 95/60 113/68  Pulse:  (!) 120 97 85  Resp: 18  18 16   Temp: 97.6 F (36.4 C)  (!) 97.5 F (36.4 C)   TempSrc: Axillary  Axillary   SpO2:  97% 100%   Weight:      Height:        Intake/Output Summary (Last 24 hours) at 08/09/2023 1443 Last data filed at 08/09/2023 1300 Gross per 24 hour  Intake 458 ml  Output --  Net 458 ml   Filed Weights   06/02/23 0500 07/02/23 1700 07/17/23 1531  Weight: 42 kg 42.2 kg 37.4 kg   Examination: Physical Exam:  Constitutional: Chronically ill-appearing frail and thin African-American female who is more agitated and squirming in the bed Respiratory: Diminished to auscultation bilaterally with some coarse breath sounds, no wheezing, rales, rhonchi or crackles. Normal respiratory effort and  patient is not tachypenic. No accessory muscle use.  Unlabored breathing Cardiovascular: RRR, no murmurs / rubs / gallops. S1 and S2 auscultated. No extremity edema.  Abdomen: Soft, non-tender, non-distended. Bowel sounds positive.  GU: Deferred. Musculoskeletal: No clubbing / cyanosis of digits/nails. No joint deformity upper and lower extremities.  Skin: No rashes, lesions, ulcers on a  limited skin evaluation. No induration; Warm and dry.  Neurologic: Moves extremities independently and does not follow commands Psychiatric: Impaired judgement and insight  Data Reviewed: I have personally reviewed following labs and imaging studies  CBC: Recent Labs  Lab 08/03/23 1104  WBC 5.1  HGB 13.8  HCT 42.3  MCV 92.2  PLT 271   Basic Metabolic Panel: Recent Labs  Lab 08/03/23 1104  NA 147*  K 4.3  CL 113*  CO2 25  GLUCOSE 91  BUN 31*  CREATININE 1.21*  CALCIUM  9.3   GFR: Estimated Creatinine Clearance: 30.7 mL/min (A) (by C-G formula based on SCr of 1.21 mg/dL (H)). Liver Function Tests: Recent Labs  Lab 08/03/23 1104  AST 18  ALT 15  ALKPHOS 40  BILITOT 0.6  PROT 7.2  ALBUMIN  3.3*   No results for input(s): "LIPASE", "AMYLASE" in the last 168 hours. No results for input(s): "AMMONIA" in the last 168 hours. Coagulation Profile: No results for input(s): "INR", "PROTIME" in the last 168 hours. Cardiac Enzymes: No results for input(s): "CKTOTAL", "CKMB", "CKMBINDEX", "TROPONINI" in the last 168 hours. BNP (last 3 results) No results for input(s): "PROBNP" in the last 8760 hours. HbA1C: No results for input(s): "HGBA1C" in the last 72 hours. CBG: No results for input(s): "GLUCAP" in the last 168 hours. Lipid Profile: No results for input(s): "CHOL", "HDL", "LDLCALC", "TRIG", "CHOLHDL", "LDLDIRECT" in the last 72 hours. Thyroid Function Tests: No results for input(s): "TSH", "T4TOTAL", "FREET4", "T3FREE", "THYROIDAB" in the last 72 hours. Anemia Panel: No results for input(s): "VITAMINB12", "FOLATE", "FERRITIN", "TIBC", "IRON", "RETICCTPCT" in the last 72 hours. Sepsis Labs: No results for input(s): "PROCALCITON", "LATICACIDVEN" in the last 168 hours.  No results found for this or any previous visit (from the past 240 hours).   Radiology Studies: No results found.  Scheduled Meds:  feeding supplement  237 mL Oral BID BM   LORazepam   0.5 mg Oral  TID   Continuous Infusions:   LOS: 149 days   [name], DO [facility] Available via Epic secure chat 7am-7pm After these hours, please refer to coverage provider listed on [email or web address] 08/09/2023, 2:43 PM

## 2023-08-10 NOTE — Progress Notes (Signed)
 PROGRESS NOTE    Natalie Brown  YQM:[id number] DOB: [birth year] DOA: 01/22/2023 PCP: Darol Elizabeth, NP   Brief Narrative:  Natalie Brown is 58 year old female, past medical history significant of bipolar disorder with catatonia, hypertension, dementia, B12 deficiency and severe protein calorie malnutrition initially admitted to the hospital for agitation.  Patient was seen by neurology in the past as well and was thought to have neurodegenerative process.  Psychiatry team also followed the patient during hospitalization.  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  Recommend to continue lorazepam .  Goal of care remains comfort care, verified with palliative care as well as psychiatry. 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 Currently on comfort care.  TOC following No dispo plans in place   Terminal Care/full Comfort Care: Has MOST form completed 12-19-2022. Comfort care.    CKD stage 3a, GFR 45-59 ml/min:  Comfort care   Essential Hypertension: Not on any medications   Protein-calorie malnutrition, severe, underweight Nutrition Status: Nutrition Problem: Severe Malnutrition Etiology: chronic illness Signs/Symptoms: severe fat depletion, severe muscle depletion, energy intake < or equal to 50% for > or equal to 5 days Interventions: Magic cup Estimated body mass index is 13.32 kg/m as calculated from the following:   Height as of this encounter: 5\' 6"  (1.676 m).   Weight as of this encounter: 37.4 kg.  Hypoalbuminemia: Albumin  level was 3.3 on last check. Will not CTM and Trend and repeat CMP given that she is Comfort Care   DVT prophylaxis: None as patient is Comfort Care     Code Status: Limited: Do not attempt resuscitation (DNR) -DNR-LIMITED -Do Not Intubate/DNI  Family Communication: No family present at bedside  Disposition Plan:  Level of care: Med-Surg Status is: Inpatient Remains inpatient appropriate because: Continues to await Disposition but has no bed offers for placement currently    Consultants:  Psychiatry Palliative Care Medicine   Procedures:  As delineated as above  Antimicrobials:  Anti-infectives (From admission, onward)    None       Subjective: Him examined at bedside and remains nonverbal but appeared uncomfortable moving quite a bit in the bed.  Still does not interact with me and does not verbalize anything.  Objective: Vitals:   08/07/23 2023 08/09/23 0016 08/09/23 1550 08/10/23 1014  BP: 95/60 113/68 139/69 118/73  Pulse: 97 85  88  Resp: 18 16    Temp: (!) 97.5 F (36.4 C)     TempSrc: Axillary     SpO2: 100%     Weight:      Height:        Intake/Output Summary (Last 24 hours) at 08/10/2023 1707 Last data filed at 08/10/2023 1209 Gross per 24 hour  Intake 6 ml  Output --  Net 6 ml   Filed Weights   06/02/23 0500 07/02/23 1700 07/17/23 1531  Weight: 42 kg 42.2 kg 37.4 kg   Examination: Physical Exam:  Constitutional: Chronically ill-appearing frail and thin African-American female who appears to still not able to sit still in the bed and continues to move Respiratory: Diminished to auscultation bilaterally, no wheezing, rales, rhonchi or crackles. Normal respiratory effort and patient is not tachypenic. No accessory muscle  use.  Unlabored breathing Cardiovascular: RRR, no murmurs / rubs / gallops. S1 and S2 auscultated. No extremity edema. Abdomen: Soft, non-tender, non-distended. Bowel sounds positive.  GU: Deferred. Musculoskeletal: No clubbing / cyanosis of digits/nails. No joint deformity upper and lower extremities.  Skin: No rashes, lesions, ulcers on a limited skin evaluation. No induration; Warm  and dry.  Neurologic: Does not follow commands and moves extremities independently. Psychiatric: Continues to have impaired judgment insight  Data Reviewed: I have personally reviewed following labs and imaging studies  CBC: No results for input(s): "WBC", "NEUTROABS", "HGB", "HCT", "MCV", "PLT" in the last 168 hours. Basic Metabolic Panel: No results for input(s): "NA", "K", "CL", "CO2", "GLUCOSE", "BUN", "CREATININE", "CALCIUM ", "MG", "PHOS" in the last 168 hours. GFR: Estimated Creatinine Clearance: 30.7 mL/min (A) (by C-G formula based on SCr of 1.21 mg/dL (H)). Liver Function Tests: No results for input(s): "AST", "ALT", "ALKPHOS", "BILITOT", "PROT", "ALBUMIN " in the last 168 hours. No results for input(s): "LIPASE", "AMYLASE" in the last 168 hours. No results for input(s): "AMMONIA" in the last 168 hours. Coagulation Profile: No results for input(s): "INR", "PROTIME" in the last 168 hours. Cardiac Enzymes: No results for input(s): "CKTOTAL", "CKMB", "CKMBINDEX", "TROPONINI" in the last 168 hours. BNP (last 3 results) No results for input(s): "PROBNP" in the last 8760 hours. HbA1C: No results for input(s): "HGBA1C" in the last 72 hours. CBG: No results for input(s): "GLUCAP" in the last 168 hours. Lipid Profile: No results for input(s): "CHOL", "HDL", "LDLCALC", "TRIG", "CHOLHDL", "LDLDIRECT" in the last 72 hours. Thyroid Function Tests: No results for input(s): "TSH", "T4TOTAL", "FREET4", "T3FREE", "THYROIDAB" in the last 72 hours. Anemia Panel: No results for input(s): "VITAMINB12", "FOLATE", "FERRITIN", "TIBC", "IRON", "RETICCTPCT" in the last 72 hours. Sepsis Labs: No results for input(s): "PROCALCITON", "LATICACIDVEN" in the last 168 hours.  No results found for this or any previous visit (from the past 240 hours).   Radiology Studies: No results found.  Scheduled Meds:  feeding supplement  237 mL Oral BID BM   LORazepam   0.5 mg Oral TID   Continuous  Infusions:   LOS: 150 days   [name], DO [facility] Available via Epic secure chat 7am-7pm After these hours, please refer to coverage provider listed on [email or web address] 08/10/2023, 5:07 PM

## 2023-08-11 NOTE — Progress Notes (Signed)
 PROGRESS NOTE    Natalie Brown  WUJ:[id number] DOB: [birth year] DOA: 01/22/2023 PCP: Darol Elizabeth, NP   Brief Narrative:  Natalie Brown is 58 year old female, past medical history significant of bipolar disorder with catatonia, hypertension, dementia, B12 deficiency and severe protein calorie malnutrition initially admitted to the hospital for agitation.  Patient was seen by neurology in the past as well and was thought to have neurodegenerative process.  Psychiatry team also followed the patient during hospitalization.  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  Recommend to continue lorazepam .  Goal of care remains comfort care, verified with palliative care as well as psychiatry. 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 Currently on comfort care.  TOC following No dispo plans in place   Terminal Care/full Comfort Care: Has MOST form completed 12-19-2022. Comfort care.    CKD stage 3a, GFR 45-59 ml/min:  Comfort care   Essential Hypertension: Not on any medications   Protein-calorie malnutrition, severe, underweight Nutrition Status: Nutrition Problem: Severe Malnutrition Etiology: chronic illness Signs/Symptoms: severe fat depletion, severe muscle depletion, energy intake < or equal to 50% for > or equal to 5 days Interventions: Magic cup Estimated body mass index is 13.32 kg/m as calculated from the following:   Height as of this encounter: 5\' 6"  (1.676 m).   Weight as of this encounter: 37.4 kg.  Hypoalbuminemia: Albumin  level was 3.3 on last check. Will not CTM and Trend and repeat CMP given that she is Comfort Care   DVT prophylaxis: None; Comfort Care    Code Status:  Limited: Do not attempt resuscitation (DNR) -DNR-LIMITED -Do Not Intubate/DNI  Family Communication: No family present at bedside   Disposition Plan:  Level of care: Med-Surg Status is: Inpatient Remains inpatient appropriate because: Continues to await Disposition but has no bed offers for placement currently     Consultants:  Psychiatry Palliative Care Medicine   Procedures:  As delineated as above  Antimicrobials:  Anti-infectives (From admission, onward)    None       Subjective: Seen and examined at bedside and remains nonverbal and appears to be very fidgety and agitated and moving throughout the entire bed.  Still does not interact with me and does not verbalize anything.  Objective: Vitals:   08/09/23 0016 08/09/23 1550 08/10/23 1014 08/10/23 2200  BP: 113/68 139/69 118/73 (!) 101/52  Pulse: 85  88 89  Resp: 16   18  Temp:    97.8 F (36.6 C)  TempSrc:    Oral  SpO2:    99%  Weight:      Height:        Intake/Output Summary (Last 24 hours) at 08/11/2023 1438 Last data filed at 08/11/2023 9562 Gross per 24 hour  Intake 50 ml  Output --  Net 50 ml   Filed Weights   06/02/23 0500 07/02/23 1700 07/17/23 1531  Weight: 42 kg 42.2 kg 37.4 kg   Examination: Physical Exam:  Constitutional: Chronically ill-appearing frail and thin AAF who remains not able to sit still and continues to move and fidget and remains nonverbal Respiratory: Diminished to auscultation bilaterally, no wheezing, rales, rhonchi or crackles. Normal respiratory effort and patient is not tachypenic.  No accessory muscle use.  Unlabored breathing Cardiovascular: RRR, no murmurs / rubs / gallops. S1 and S2 auscultated. No extremity edema.  Abdomen: Soft, non-tender, non-distended. Bowel sounds positive.  GU: Deferred. Musculoskeletal: No clubbing / cyanosis of digits/nails. No joint deformity upper and lower extremities.  Skin: No rashes, lesions, ulcers on a limited skin evaluation. No  induration; Warm and dry.  Neurologic: Does not follow commands and moves extremities independently. Psychiatric: Continues to have impaired judgment insight and appears little agitated  Data Reviewed: I have personally reviewed following labs and imaging studies  CBC: No results for input(s): "WBC", "NEUTROABS", "HGB", "HCT", "MCV", "PLT" in the last 168 hours. Basic Metabolic Panel: No results for input(s): "NA", "K", "CL", "CO2", "GLUCOSE", "BUN", "CREATININE", "CALCIUM ", "MG", "PHOS" in the last 168 hours. GFR: Estimated Creatinine Clearance: 30.7 mL/min (A) (by C-G formula based on SCr of 1.21 mg/dL (H)). Liver Function Tests: No results for input(s): "AST", "ALT", "ALKPHOS", "BILITOT", "PROT", "ALBUMIN " in the last 168 hours. No results for input(s): "LIPASE", "AMYLASE" in the last 168 hours. No results for input(s): "AMMONIA" in the last 168 hours. Coagulation Profile: No results for input(s): "INR", "PROTIME" in the last 168 hours. Cardiac Enzymes: No results for input(s): "CKTOTAL", "CKMB", "CKMBINDEX", "TROPONINI" in the last 168 hours. BNP (last 3 results) No results for input(s): "PROBNP" in the last 8760 hours. HbA1C: No results for input(s): "HGBA1C" in the last 72 hours. CBG: No results for input(s): "GLUCAP" in the last 168 hours. Lipid Profile: No results for input(s): "CHOL", "HDL", "LDLCALC", "TRIG", "CHOLHDL", "LDLDIRECT" in the last 72 hours. Thyroid Function Tests: No results for input(s): "TSH", "T4TOTAL", "FREET4", "T3FREE", "THYROIDAB" in the last 72 hours. Anemia Panel: No results for input(s): "VITAMINB12", "FOLATE", "FERRITIN", "TIBC", "IRON", "RETICCTPCT" in the last 72 hours. Sepsis Labs: No results for input(s): "PROCALCITON", "LATICACIDVEN" in the last 168 hours.  No results found for this or any previous visit (from the past 240 hours).   Radiology Studies: No results found.  Scheduled Meds:  feeding supplement  237 mL Oral BID BM   LORazepam    0.5 mg Oral TID   Continuous Infusions:   LOS: 151 days   [name], DO [facility] Available via Epic secure chat 7am-7pm After these hours, please refer to coverage provider listed on [email or web address] 08/11/2023, 2:38 PM

## 2023-08-11 NOTE — Plan of Care (Signed)
  Problem: Safety: Goal: Non-violent Restraint(s) Outcome: Progressing   Problem: Education: Goal: Knowledge of the prescribed therapeutic regimen will improve Outcome: Progressing   Problem: Coping: Goal: Ability to identify and develop effective coping behavior will improve Outcome: Progressing   Problem: Clinical Measurements: Goal: Quality of life will improve Outcome: Progressing   Problem: Respiratory: Goal: Verbalizations of increased ease of respirations will increase Outcome: Progressing   Problem: Role Relationship: Goal: Family's ability to cope with current situation will improve Outcome: Progressing Goal: Ability to verbalize concerns, feelings, and thoughts to partner or family member will improve Outcome: Progressing   Problem: Pain Management: Goal: Satisfaction with pain management regimen will improve Outcome: Progressing

## 2023-08-12 NOTE — Progress Notes (Signed)
 PROGRESS NOTE    Natalie Brown  EAV:[id number] DOB: 04-11-1966 DOA: 01/22/2023 PCP: Darol Elizabeth, NP   Brief Narrative:  Natalie Eans. Brown is 58 year old female, past medical history significant of bipolar disorder with catatonia, hypertension, dementia, B12 deficiency and severe protein calorie malnutrition initially admitted to the hospital for agitation.  Patient was seen by neurology in the past as well and was thought to have neurodegenerative process.  Psychiatry team also followed the patient during hospitalization.  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  Recommend to continue lorazepam .  Goal of care remains comfort care, verified with palliative care as well as psychiatry. 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 Currently on comfort care.  TOC following No dispo plans in place   Terminal Care/full Comfort Care: Has MOST form completed 12-19-2022. Comfort care.    CKD stage 3a, GFR 45-59 ml/min:  Comfort care   Essential Hypertension: Not on any medications   Protein-calorie malnutrition, severe, underweight Nutrition Status: Nutrition Problem: Severe Malnutrition Etiology: chronic illness Signs/Symptoms: severe fat depletion, severe muscle depletion, energy intake < or equal to 50% for > or equal to 5 days Interventions: Magic cup Estimated body mass index is 13.32 kg/m as calculated from the following:   Height as of this encounter: 5\' 6"  (1.676 m).   Weight as of this encounter: 37.4 kg.  Hypoalbuminemia: Albumin  level was 3.3 on last check. Will not CTM and Trend and repeat CMP given that she is Comfort Care   DVT prophylaxis: SCDs    Code Status: Limited: Do  not attempt resuscitation (DNR) -DNR-LIMITED -Do Not Intubate/DNI  Family Communication: No family present at bedside  Disposition Plan:  Level of care: Med-Surg Status is: Inpatient Remains inpatient appropriate because: Continues to await Disposition but has no bed offers for placement currently    Consultants:  Psychiatry Palliative Care Medicine   Procedures:  As delineated as above  Antimicrobials:  Anti-infectives (From admission, onward)    None       Subjective: Seen and examined at bedside continues remain nonverbal and continues to be very agitated and squirming in the bed again.  Still does not interact with me and does not verbalize anything.  Objective: Vitals:   08/09/23 1550 08/10/23 1014 08/10/23 2200 08/11/23 2048  BP: 139/69 118/73 (!) 101/52 113/73  Pulse:  88 89 92  Resp:   18   Temp:   97.8 F (36.6 C) 98 F (36.7 C)  TempSrc:   Oral Axillary  SpO2:   99%   Weight:      Height:        Intake/Output Summary (Last 24 hours) at 08/12/2023 1616 Last data filed at 08/12/2023 1048 Gross per 24 hour  Intake 598 ml  Output --  Net 598 ml   Filed Weights   06/02/23 0500 07/02/23 1700 07/17/23 1531  Weight: 42 kg 42.2 kg 37.4 kg   Examination: Physical Exam:  Constitutional: Ill-appearing frail and thin African-American female who is not able to sit still. Respiratory: Diminished to auscultation bilaterally, no wheezing, rales, rhonchi or crackles. Normal respiratory effort and patient is not tachypenic. No accessory muscle use.  Unlabored breathing Cardiovascular: RRR, no murmurs /  rubs / gallops. S1 and S2 auscultated. No extremity edema.  Abdomen: Soft, non-tender, non-distended. Bowel sounds positive.  GU: Deferred. Musculoskeletal: No clubbing / cyanosis of digits/nails. No joint deformity upper and lower extremities.  Skin: No rashes, lesions, ulcers on a limited skin evaluation. No induration; Warm and dry.  Neurologic: Does not follow  commands and moves extremities independently Psychiatric: Continues to have impaired judgment and insight and does not acknowledge or respond  Data Reviewed: I have personally reviewed following labs and imaging studies  CBC: No results for input(s): "WBC", "NEUTROABS", "HGB", "HCT", "MCV", "PLT" in the last 168 hours. Basic Metabolic Panel: No results for input(s): "NA", "K", "CL", "CO2", "GLUCOSE", "BUN", "CREATININE", "CALCIUM ", "MG", "PHOS" in the last 168 hours. GFR: Estimated Creatinine Clearance: 30.7 mL/min (A) (by C-G formula based on SCr of 1.21 mg/dL (H)). Liver Function Tests: No results for input(s): "AST", "ALT", "ALKPHOS", "BILITOT", "PROT", "ALBUMIN " in the last 168 hours. No results for input(s): "LIPASE", "AMYLASE" in the last 168 hours. No results for input(s): "AMMONIA" in the last 168 hours. Coagulation Profile: No results for input(s): "INR", "PROTIME" in the last 168 hours. Cardiac Enzymes: No results for input(s): "CKTOTAL", "CKMB", "CKMBINDEX", "TROPONINI" in the last 168 hours. BNP (last 3 results) No results for input(s): "PROBNP" in the last 8760 hours. HbA1C: No results for input(s): "HGBA1C" in the last 72 hours. CBG: No results for input(s): "GLUCAP" in the last 168 hours. Lipid Profile: No results for input(s): "CHOL", "HDL", "LDLCALC", "TRIG", "CHOLHDL", "LDLDIRECT" in the last 72 hours. Thyroid Function Tests: No results for input(s): "TSH", "T4TOTAL", "FREET4", "T3FREE", "THYROIDAB" in the last 72 hours. Anemia Panel: No results for input(s): "VITAMINB12", "FOLATE", "FERRITIN", "TIBC", "IRON", "RETICCTPCT" in the last 72 hours. Sepsis Labs: No results for input(s): "PROCALCITON", "LATICACIDVEN" in the last 168 hours.  No results found for this or any previous visit (from the past 240 hours).   Radiology Studies: No results found.  Scheduled Meds:  feeding supplement  237 mL Oral BID BM   LORazepam   0.5 mg Oral TID   Continuous  Infusions:   LOS: 152 days   [name], DO [facility] Available via Epic secure chat 7am-7pm After these hours, please refer to coverage provider listed on [email or web address] 08/12/2023, 4:16 PM

## 2023-08-12 NOTE — Plan of Care (Signed)
  Problem: Safety: Goal: Non-violent Restraint(s) Outcome: Progressing   Problem: Education: Goal: Knowledge of the prescribed therapeutic regimen will improve Outcome: Progressing   Problem: Coping: Goal: Ability to identify and develop effective coping behavior will improve Outcome: Progressing   Problem: Clinical Measurements: Goal: Quality of life will improve Outcome: Progressing   Problem: Respiratory: Goal: Verbalizations of increased ease of respirations will increase Outcome: Progressing   Problem: Role Relationship: Goal: Family's ability to cope with current situation will improve Outcome: Progressing Goal: Ability to verbalize concerns, feelings, and thoughts to partner or family member will improve Outcome: Progressing   Problem: Pain Management: Goal: Satisfaction with pain management regimen will improve Outcome: Progressing

## 2023-08-13 NOTE — Progress Notes (Signed)
 PROGRESS NOTE    Natalie Brown  GEX:[id number] DOB: [birth year] DOA: 01/22/2023 PCP: Darol Elizabeth, NP   Brief Narrative:  Natalie Brown. Natalie Brown is 58 year old female, past medical history significant of bipolar disorder with catatonia, hypertension, dementia, B12 deficiency and severe protein calorie malnutrition initially admitted to the hospital for agitation.  Patient was seen by neurology in the past as well and was thought to have neurodegenerative process.  Psychiatry team also followed the patient during hospitalization.  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  Recommend to continue lorazepam .  Goal of care remains comfort care, verified with palliative care as well as psychiatry. 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 Currently on comfort care.  TOC following No dispo plans in place   Terminal Care/full Comfort Care: Has MOST form completed 12-19-2022. Comfort care.    CKD stage 3a, GFR 45-59 ml/min:  Comfort care   Essential Hypertension: Not on any medications   Protein-calorie malnutrition, severe, underweight Nutrition Status: Nutrition Problem: Severe Malnutrition Etiology: chronic illness Signs/Symptoms: severe fat depletion, severe muscle depletion, energy intake < or equal to 50% for > or equal to 5 days Interventions: Magic cup Estimated body mass index is 13.32 kg/m as calculated from the following:   Height as of this encounter: 5\' 6"  (1.676 m).   Weight as of this encounter: 37.4 kg.  Hypoalbuminemia: Albumin  level was 3.3 on last check. Will not CTM and Trend and repeat CMP given that she is Comfort Care   DVT prophylaxis: SCDs    Code Status: Limited: Do  not attempt resuscitation (DNR) -DNR-LIMITED -Do Not Intubate/DNI  Family Communication: No family present at bedside  Disposition Plan:  Level of care: Med-Surg Status is: Inpatient Remains inpatient appropriate because: Continues to await Disposition but has no bed offers for placement currently    Consultants:  Psychiatry Palliative Care Medicine   Procedures:  As delineated as above  Antimicrobials:  Anti-infectives (From admission, onward)    None       Subjective: Patient seen and evaluated this a.m. and continues to remain nonverbal.  She appears somnolent and comfortable.  No acute overnight events noted.  Objective: Vitals:   08/09/23 1550 08/10/23 1014 08/10/23 2200 08/11/23 2048  BP: 139/69 118/73 (!) 101/52 113/73  Pulse:  88 89 92  Resp:   18   Temp:   97.8 F (36.6 C) 98 F (36.7 C)  TempSrc:   Oral Axillary  SpO2:   99%   Weight:      Height:        Intake/Output Summary (Last 24 hours) at 08/13/2023 0905 Last data filed at 08/12/2023 1048 Gross per 24 hour  Intake 118 ml  Output --  Net 118 ml   Filed Weights   06/02/23 0500 07/02/23 1700 07/17/23 1531  Weight: 42 kg 42.2 kg 37.4 kg   Examination: Physical Exam:  Constitutional: Ill-appearing frail and thin African-American female who is not able to sit still. Respiratory: Diminished to auscultation bilaterally, no wheezing, rales, rhonchi or crackles. Normal respiratory effort and patient is not tachypenic. No accessory muscle use.  Unlabored breathing Cardiovascular: RRR, no murmurs / rubs / gallops. S1 and S2 auscultated. No extremity  edema.  Abdomen: Soft, non-tender, non-distended. Bowel sounds positive.  GU: Deferred. Musculoskeletal: No clubbing / cyanosis of digits/nails. No joint deformity upper and lower extremities.  Skin: No rashes, lesions, ulcers on a limited skin evaluation. No induration; Warm and dry.  Neurologic: Does not follow commands and moves extremities  independently Psychiatric: Continues to have impaired judgment and insight and does not acknowledge or respond  Data Reviewed: I have personally reviewed following labs and imaging studies  CBC: No results for input(s): "WBC", "NEUTROABS", "HGB", "HCT", "MCV", "PLT" in the last 168 hours. Basic Metabolic Panel: No results for input(s): "NA", "K", "CL", "CO2", "GLUCOSE", "BUN", "CREATININE", "CALCIUM ", "MG", "PHOS" in the last 168 hours. GFR: Estimated Creatinine Clearance: 30.7 mL/min (A) (by C-G formula based on SCr of 1.21 mg/dL (H)). Liver Function Tests: No results for input(s): "AST", "ALT", "ALKPHOS", "BILITOT", "PROT", "ALBUMIN " in the last 168 hours. No results for input(s): "LIPASE", "AMYLASE" in the last 168 hours. No results for input(s): "AMMONIA" in the last 168 hours. Coagulation Profile: No results for input(s): "INR", "PROTIME" in the last 168 hours. Cardiac Enzymes: No results for input(s): "CKTOTAL", "CKMB", "CKMBINDEX", "TROPONINI" in the last 168 hours. BNP (last 3 results) No results for input(s): "PROBNP" in the last 8760 hours. HbA1C: No results for input(s): "HGBA1C" in the last 72 hours. CBG: No results for input(s): "GLUCAP" in the last 168 hours. Lipid Profile: No results for input(s): "CHOL", "HDL", "LDLCALC", "TRIG", "CHOLHDL", "LDLDIRECT" in the last 72 hours. Thyroid Function Tests: No results for input(s): "TSH", "T4TOTAL", "FREET4", "T3FREE", "THYROIDAB" in the last 72 hours. Anemia Panel: No results for input(s): "VITAMINB12", "FOLATE", "FERRITIN", "TIBC", "IRON", "RETICCTPCT" in the last 72 hours. Sepsis Labs: No results for input(s): "PROCALCITON", "LATICACIDVEN" in the last 168 hours.  No results found for this or any previous visit (from the past 240 hours).   Radiology Studies: No results found.  Scheduled Meds:  feeding supplement  237 mL Oral BID BM   LORazepam   0.5 mg Oral TID     LOS: 153 days   Leniya Breit Mason Sole, DO [facility] Available via Epic secure chat 7am-7pm After these hours, please refer to coverage provider listed on [email or web address] 08/13/2023, 9:05 AM

## 2023-08-13 NOTE — Plan of Care (Signed)
  Problem: Safety: Goal: Non-violent Restraint(s) Outcome: Progressing   Problem: Education: Goal: Knowledge of the prescribed therapeutic regimen will improve Outcome: Progressing   Problem: Coping: Goal: Ability to identify and develop effective coping behavior will improve Outcome: Progressing   Problem: Clinical Measurements: Goal: Quality of life will improve Outcome: Progressing   Problem: Respiratory: Goal: Verbalizations of increased ease of respirations will increase Outcome: Progressing   Problem: Role Relationship: Goal: Family's ability to cope with current situation will improve Outcome: Progressing Goal: Ability to verbalize concerns, feelings, and thoughts to partner or family member will improve Outcome: Progressing   Problem: Pain Management: Goal: Satisfaction with pain management regimen will improve Outcome: Progressing

## 2023-08-14 NOTE — Progress Notes (Signed)
 PROGRESS NOTE    Natalie Brown  HYQ:[id number] DOB: September 24, 1965 DOA: 01/22/2023 PCP: Darol Elizabeth, NP   Brief Narrative:  Natalie Eans. Brown is 58 year old female, past medical history significant of bipolar disorder with catatonia, hypertension, dementia, B12 deficiency and severe protein calorie malnutrition initially admitted to the hospital for agitation.  Patient was seen by neurology in the past as well and was thought to have neurodegenerative process.  Psychiatry team also followed the patient during hospitalization.  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  Recommend to continue lorazepam .  Goal of care remains comfort care, verified with palliative care as well as psychiatry. 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 Currently on comfort care.  TOC following No dispo plans in place   Terminal Care/full Comfort Care: Has MOST form completed 12-19-2022. Comfort care.    CKD stage 3a, GFR 45-59 ml/min:  Comfort care   Essential Hypertension: Not on any medications   Protein-calorie malnutrition, severe, underweight Nutrition Status: Nutrition Problem: Severe Malnutrition Etiology: chronic illness Signs/Symptoms: severe fat depletion, severe muscle depletion, energy intake < or equal to 50% for > or equal to 5 days Interventions: Magic cup Estimated body mass index is 13.32 kg/m as calculated from the following:   Height as of this encounter: 5\' 6"  (1.676 m).   Weight as of this encounter: 37.4 kg.  Hypoalbuminemia: Albumin  level was 3.3 on last check. Will not CTM and Trend and repeat CMP given that she is Comfort Care   DVT prophylaxis: SCDs    Code Status: Limited: Do  not attempt resuscitation (DNR) -DNR-LIMITED -Do Not Intubate/DNI  Family Communication: No family present at bedside  Disposition Plan:  Level of care: Med-Surg Status is: Inpatient Remains inpatient appropriate because: Continues to await Disposition but has no bed offers for placement currently    Consultants:  Psychiatry Palliative Care Medicine   Procedures:  As delineated as above  Antimicrobials:  Anti-infectives (From admission, onward)    None       Subjective: Patient seen and evaluated this a.m. and continues to remain nonverbal.  She appears somnolent and comfortable.  No acute overnight events noted.  Objective: Vitals:   08/13/23 0959 08/13/23 1519 08/13/23 2040 08/14/23 0849  BP: 103/80 (!) 95/55 (!) 86/61 100/60  Pulse: 64 (!) 59 78 97  Resp: 17 17 18 18   Temp:  98.2 F (36.8 C) 97.9 F (36.6 C) (!) 97.5 F (36.4 C)  TempSrc:  Axillary Oral Oral  SpO2: 98% 100% 99%   Weight:      Height:        Intake/Output Summary (Last 24 hours) at 08/14/2023 0929 Last data filed at 08/14/2023 0900 Gross per 24 hour  Intake 118 ml  Output --  Net 118 ml   Filed Weights   06/02/23 0500 07/02/23 1700 07/17/23 1531  Weight: 42 kg 42.2 kg 37.4 kg   Examination: Physical Exam:  Constitutional: Ill-appearing frail and thin African-American female who is not able to sit still. Respiratory: Diminished to auscultation bilaterally, no wheezing, rales, rhonchi or crackles. Normal respiratory effort and patient is not tachypenic. No accessory muscle use.  Unlabored breathing Cardiovascular: RRR, no murmurs / rubs / gallops.  S1 and S2 auscultated. No extremity edema.  Abdomen: Soft, non-tender, non-distended. Bowel sounds positive.  GU: Deferred. Musculoskeletal: No clubbing / cyanosis of digits/nails. No joint deformity upper and lower extremities.  Skin: No rashes, lesions, ulcers on a limited skin evaluation. No induration; Warm and dry.  Neurologic: Does not  follow commands and moves extremities independently Psychiatric: Continues to have impaired judgment and insight and does not acknowledge or respond  Data Reviewed: I have personally reviewed following labs and imaging studies  CBC: No results for input(s): "WBC", "NEUTROABS", "HGB", "HCT", "MCV", "PLT" in the last 168 hours. Basic Metabolic Panel: No results for input(s): "NA", "K", "CL", "CO2", "GLUCOSE", "BUN", "CREATININE", "CALCIUM ", "MG", "PHOS" in the last 168 hours. GFR: Estimated Creatinine Clearance: 30.7 mL/min (A) (by C-G formula based on SCr of 1.21 mg/dL (H)). Liver Function Tests: No results for input(s): "AST", "ALT", "ALKPHOS", "BILITOT", "PROT", "ALBUMIN " in the last 168 hours. No results for input(s): "LIPASE", "AMYLASE" in the last 168 hours. No results for input(s): "AMMONIA" in the last 168 hours. Coagulation Profile: No results for input(s): "INR", "PROTIME" in the last 168 hours. Cardiac Enzymes: No results for input(s): "CKTOTAL", "CKMB", "CKMBINDEX", "TROPONINI" in the last 168 hours. BNP (last 3 results) No results for input(s): "PROBNP" in the last 8760 hours. HbA1C: No results for input(s): "HGBA1C" in the last 72 hours. CBG: No results for input(s): "GLUCAP" in the last 168 hours. Lipid Profile: No results for input(s): "CHOL", "HDL", "LDLCALC", "TRIG", "CHOLHDL", "LDLDIRECT" in the last 72 hours. Thyroid Function Tests: No results for input(s): "TSH", "T4TOTAL", "FREET4", "T3FREE", "THYROIDAB" in the last 72 hours. Anemia Panel: No results for input(s): "VITAMINB12", "FOLATE", "FERRITIN", "TIBC", "IRON", "RETICCTPCT" in the last 72 hours. Sepsis Labs: No results for input(s): "PROCALCITON", "LATICACIDVEN" in the last 168 hours.  No results found for this or any previous visit (from the past 240 hours).   Radiology Studies: No results found.  Scheduled Meds:  feeding supplement  237 mL Oral BID BM   LORazepam   0.5 mg Oral TID     LOS: 154 days    Marylon Verno Mason Sole, DO [facility] Available via Epic secure chat 7am-7pm After these hours, please refer to coverage provider listed on [email or web address] 08/14/2023, 9:29 AM

## 2023-08-14 NOTE — Plan of Care (Signed)
  Problem: Safety: Goal: Non-violent Restraint(s) Outcome: Progressing   Problem: Education: Goal: Knowledge of the prescribed therapeutic regimen will improve Outcome: Progressing   Problem: Coping: Goal: Ability to identify and develop effective coping behavior will improve Outcome: Progressing   Problem: Clinical Measurements: Goal: Quality of life will improve Outcome: Progressing   Problem: Respiratory: Goal: Verbalizations of increased ease of respirations will increase Outcome: Progressing   Problem: Role Relationship: Goal: Family's ability to cope with current situation will improve Outcome: Progressing Goal: Ability to verbalize concerns, feelings, and thoughts to partner or family member will improve Outcome: Progressing   Problem: Pain Management: Goal: Satisfaction with pain management regimen will improve Outcome: Progressing

## 2023-08-15 NOTE — Progress Notes (Signed)
  Progress Note   Patient: Natalie Brown ZOX:[id number] DOB: March 02, 1966 DOA: 01/22/2023     155 DOS: the patient was seen and examined on 08/15/2023        Brief hospital course: 58 y.o. F with bipolar disorder and early onset dementia who was admitted for failure to thrive .  This is a long and complicated hospitalization spanning 2 years.  For details, see my interim summary from 12/17/22, Dr. Mardella Shadow summary from 01/22/23, or Dr. Shearon Denis summary from 07/17/23.  In brief, she was admitted in 2023 for increasingly erratic behavior and cognitive impairment.  She was evaluated by Neurology and Psychiatry, but no treatable or reversible cause of her impairment could be found.  It appears she has an early onset dementia with comorbid bipolar disorder, leading to weight loss, malnutrition and failure to thrive .  For some reason, during that admission, there was a prolonged delay for over a year in placement in memory care until October 2024, and tragically, on arriving at that facility, she exhibited normal change of setting agitation behaviors, and so she was denied entry and sent back to the hospital the same day.    She remained in ER holding for 6 weeks until she was transferred upstairs in Nov 2024 and has been on [facility] since.  [name] nonverbal and does not follow commands. She can ambulate with assistance.  She eats intermittently, but her weight has now dropped to around 85 lbs.         Assessment and Plan: Early onset dementia with behavioral disturbance Bipolar disorder Severe protein calorie malnutrition -Full comfort care  Chronic kidney disease stage IIIa -Full comfort care, avoid further blood checks  Essential hypertension No longer on medication       Subjective: Nonverbal.  Nursing report she went into another patient's room today while on her walk.     Physical Exam: BP 109/69 (BP Location: Right Leg)   Pulse 92   Temp 98.4 F (36.9 C)   Resp 18   Ht 5\' 6"   (1.676 m)   Wt 37.4 kg   SpO2 100%   BMI 13.32 kg/m   Cachectic adult female, lying in bed in fetal position, head covered, does not respond to my voice or follow commands. Breathing rate appears normal.  Does not cooperate with exam, but I do not here abnormal lung sounds.  Radial pulse weak, seems somewhat fast.  Severe, extreme loss of subcutaneous muscle mass and fat.       Data Reviewed:   Family Communication:     Disposition: Status is: Inpatient         Author: Ephriam Hashimoto, MD 08/15/2023 5:05 PM  For on call review [email or web address].

## 2023-08-15 NOTE — Plan of Care (Signed)
  Problem: Safety: Goal: Non-violent Restraint(s) Outcome: Progressing   Problem: Clinical Measurements: Goal: Quality of life will improve Outcome: Progressing   Problem: Pain Management: Goal: Satisfaction with pain management regimen will improve Outcome: Progressing

## 2023-08-16 NOTE — Progress Notes (Signed)
  Progress Note   Patient: Natalie Brown [name] DOB: 1965/12/07 DOA: 01/22/2023     156 DOS: the patient was seen and examined on 08/16/2023        Brief hospital course: 58 y.o. F with bipolar disorder and early onset dementia who was admitted for failure to thrive .  This is a long and complicated hospitalization spanning 2 years.  For details, see Dr Pamila Boers note summary from 12/17/22, Dr. Mardella Shadow summary from 01/22/23, or Dr. Shearon Denis summary from 07/17/23.  In brief, she was admitted in 2023 for increasingly erratic behavior and cognitive impairment.  She was evaluated by Neurology and Psychiatry, but no treatable or reversible cause of her impairment could be found.  It appears she has an early onset dementia with comorbid bipolar disorder, leading to weight loss, malnutrition and failure to thrive .  For some reason, during that admission, there was a prolonged delay for over a year in placement in memory care until October 2024, and tragically, on arriving at that facility, she exhibited agitation behaviors, and so she was denied entry and sent back to the hospital the same day.    She remained in ER holding for 6 weeks until she was transferred upstairs in Nov 2024 and has been on [facility] since.  [name] nonverbal and does not follow commands. She can ambulate with assistance.  She eats intermittently, but her weight has now dropped to around 85 lbs.         Assessment and Plan: Early onset dementia with behavioral disturbance Bipolar disorder Severe protein calorie malnutrition -Full comfort care.  Symptomatic management.  On scheduled lorazepam  0.5 mg 3 times daily and also as needed lorazepam .  Chronic kidney disease stage IIIa -Full comfort care, avoid further blood checks  Essential hypertension No longer on medication       Subjective: Went to examine patient.  [name] tossing around in her bed, restrained on the Posey bed.  Does not interact.     Physical  Exam: BP 125/77 (BP Location: Right Leg)   Pulse 68   Temp 98.4 F (36.9 C)   Resp 17   Ht 5\' 6"  (1.676 m)   Wt 37.4 kg   SpO2 100%   BMI 13.32 kg/m   Cachectic.  Does not interact.  Tossing around.  On fetal position.     Data Reviewed:   Family Communication: None    Disposition: Status is: Inpatient         Author: Vada Garibaldi, MD 08/16/2023 10:39 AM  For on call review [email or web address].

## 2023-08-16 NOTE — Progress Notes (Signed)
 Unable to get pt vitals signs due to her moving all in the bed.

## 2023-08-17 NOTE — Plan of Care (Signed)
  Problem: Safety: Goal: Non-violent Restraint(s) Outcome: Progressing   Problem: Education: Goal: Knowledge of the prescribed therapeutic regimen will improve Outcome: Progressing   Problem: Coping: Goal: Ability to identify and develop effective coping behavior will improve Outcome: Progressing   Problem: Clinical Measurements: Goal: Quality of life will improve Outcome: Progressing   Problem: Respiratory: Goal: Verbalizations of increased ease of respirations will increase Outcome: Progressing   Problem: Role Relationship: Goal: Family's ability to cope with current situation will improve Outcome: Progressing Goal: Ability to verbalize concerns, feelings, and thoughts to partner or family member will improve Outcome: Progressing   Problem: Pain Management: Goal: Satisfaction with pain management regimen will improve Outcome: Progressing

## 2023-08-17 NOTE — Progress Notes (Signed)
  Progress Note   Patient: Natalie Brown [id number] DOB: 04/05/1966 DOA: 01/22/2023     157 DOS: the patient was seen and examined on 08/17/2023        Brief hospital course: 58 y.o. F with bipolar disorder and early onset dementia who was admitted for failure to thrive .  This is a long and complicated hospitalization spanning 2 years.  For details, see Dr Pamila Boers note summary from 12/17/22, Dr. Mardella Shadow summary from 01/22/23, or Dr. Shearon Denis summary from 07/17/23.  In brief, she was admitted in 2023 for increasingly erratic behavior and cognitive impairment.  She was evaluated by Neurology and Psychiatry, but no treatable or reversible cause of her impairment could be found.  It appears she has an early onset dementia with comorbid bipolar disorder, leading to weight loss, malnutrition and failure to thrive .  For some reason, during that admission, there was a prolonged delay for over a year in placement in memory care until October 2024, and tragically, on arriving at that facility, she exhibited agitation behaviors, and so she was denied entry and sent back to the hospital the same day.    She remained in ER holding for 6 weeks until she was transferred upstairs in Nov 2024 and has been on [facility] since.  [name] nonverbal and does not follow commands. She can ambulate with assistance.  Eats very less with help.    Assessment and Plan: Early onset dementia with behavioral disturbance Bipolar disorder Severe protein calorie malnutrition -Full comfort care.  Symptomatic management.  On scheduled lorazepam  0.5 mg 3 times daily and also as needed lorazepam .  Chronic kidney disease stage IIIa -Full comfort care, avoid further blood checks  Essential hypertension No longer on medication       Subjective: Patient seen and examined.  Nurse assistant fed her a few bites of breakfast while she was moving around and restless.  She would not talk.  She will go back to fetal position on  approach.    Physical Exam: BP 100/71 (BP Location: Right Arm)   Pulse 64   Temp 97.6 F (36.4 C) (Oral)   Resp 18   Ht 5\' 6"  (1.676 m)   Wt 37.4 kg   SpO2 100%   BMI 13.32 kg/m   Cachectic.  Does not interact.  Looks comfortable.  Sleepy.  On fetal position.     Data Reviewed:   Family Communication: None    Disposition: Status is: Inpatient         Author: Vada Garibaldi, MD 08/17/2023 10:43 AM  For on call review [email or web address].

## 2023-08-18 NOTE — Progress Notes (Signed)
  Progress Note   Patient: Natalie Brown [id number] DOB: [birth year] DOA: 01/22/2023     158 DOS: the patient was seen and examined on 08/18/2023        Brief hospital course: 58 y.o. F with bipolar disorder and early onset dementia who was admitted for failure to thrive .  This is a long and complicated hospitalization spanning 2 years.  For details, see Dr Pamila Boers note summary from 12/17/22, Dr. Mardella Shadow summary from 01/22/23, or Dr. Shearon Denis summary from 07/17/23.  In brief, she was admitted in 2023 for increasingly erratic behavior and cognitive impairment.  She was evaluated by Neurology and Psychiatry, but no treatable or reversible cause of her impairment could be found.  It appears she has an early onset dementia with comorbid bipolar disorder, leading to weight loss, malnutrition and failure to thrive .  For some reason, during that admission, there was a prolonged delay for over a year in placement in memory care until October 2024, and tragically, on arriving at that facility, she exhibited agitation behaviors, and so she was denied entry and sent back to the hospital the same day.    She remained in ER holding for 6 weeks until she was transferred upstairs in Nov 2024 and has been on [facility] since.  [name] nonverbal and does not follow commands. She can ambulate with assistance.  Eats very less with help.    Assessment and Plan: Early onset dementia with behavioral disturbance Bipolar disorder Severe protein calorie malnutrition -Full comfort care.  Symptomatic management.  On scheduled lorazepam  0.5 mg 3 times daily and also as needed lorazepam .  Chronic kidney disease stage IIIa -Full comfort care, avoid further blood checks  Essential hypertension No longer on medication       Subjective: Patient seen and examined.  Did not respond.  [name] curled in fetal position.   Physical Exam: BP (!) 130/101   Pulse 70   Temp 98.5 F (36.9 C) (Oral)   Resp 15   Ht 5\' 6"   (1.676 m)   Wt 37.4 kg   SpO2 96%   BMI 13.32 kg/m   Cachectic.  Does not interact.  Looks comfortable.  Sleepy.  On fetal position.     Data Reviewed:   Family Communication: None    Disposition: Status is: Inpatient         Author: Vada Garibaldi, MD 08/18/2023 9:43 AM  For on call review [email or web address].

## 2023-08-18 NOTE — Plan of Care (Signed)
  Problem: Safety: Goal: Non-violent Restraint(s) Outcome: Progressing   Problem: Education: Goal: Knowledge of the prescribed therapeutic regimen will improve Outcome: Progressing   Problem: Coping: Goal: Ability to identify and develop effective coping behavior will improve Outcome: Progressing   Problem: Clinical Measurements: Goal: Quality of life will improve Outcome: Progressing   Problem: Respiratory: Goal: Verbalizations of increased ease of respirations will increase Outcome: Progressing   Problem: Role Relationship: Goal: Family's ability to cope with current situation will improve Outcome: Progressing Goal: Ability to verbalize concerns, feelings, and thoughts to partner or family member will improve Outcome: Progressing   Problem: Pain Management: Goal: Satisfaction with pain management regimen will improve Outcome: Progressing

## 2023-08-19 NOTE — Plan of Care (Signed)
  Problem: Safety: Goal: Non-violent Restraint(s) Outcome: Not Progressing   Problem: Education: Goal: Knowledge of the prescribed therapeutic regimen will improve Outcome: Not Progressing   Problem: Coping: Goal: Ability to identify and develop effective coping behavior will improve Outcome: Not Progressing

## 2023-08-19 NOTE — Progress Notes (Signed)
  Progress Note   Patient: Natalie Brown [id number]:[id number] DOB: May 25, 1965 DOA: 01/22/2023     159 DOS: the patient was seen and examined on 08/19/2023        Brief hospital course: 58 y.o. F with bipolar disorder and early onset dementia who was admitted for failure to thrive .  This is a long and complicated hospitalization spanning 2 years.  For details, see Dr Pamila Boers note summary from 12/17/22, Dr. Mardella Shadow summary from 01/22/23, or Dr. Shearon Denis summary from 07/17/23.  In brief, she was admitted in 2023 for increasingly erratic behavior and cognitive impairment.  She was evaluated by Neurology and Psychiatry, but no treatable or reversible cause of her impairment could be found.  It appears she has an early onset dementia with comorbid bipolar disorder, leading to weight loss, malnutrition and failure to thrive .  For some reason, during that admission, there was a prolonged delay for over a year in placement in memory care until October 2024, and tragically, on arriving at that facility, she exhibited agitation behaviors, and so she was denied entry and sent back to the hospital the same day.    She remained in ER holding for 6 weeks until she was transferred upstairs in Nov 2024 and has been on [facility] since.  [name] nonverbal and does not follow commands. She can ambulate with assistance.  Eats very less with help.    Assessment and Plan: Early onset dementia with behavioral disturbance Bipolar disorder Severe protein calorie malnutrition -Full comfort care.  Symptomatic management.  On scheduled lorazepam  0.5 mg 3 times daily and also as needed lorazepam .  Chronic kidney disease stage IIIa -Full comfort care, avoid further blood checks  Essential hypertension No longer on medication       Subjective: Patient seen.  Does not respond.  Sleeping in fetal position.  Nursing reported no new events.   Physical Exam: BP 118/75 (BP Location: Right Arm)   Pulse 66   Temp 97.7 F (36.5  C) (Oral)   Resp 18   Ht 5\' 6"  (1.676 m)   Wt 37.4 kg   SpO2 99%   BMI 13.32 kg/m   Cachectic.  Does not interact.  Looks comfortable.  Sleepy.  On fetal position.     Data Reviewed:   Family Communication: None    Disposition: Status is: Inpatient         Author: Vada Garibaldi, MD 08/19/2023 10:43 AM  For on call review [email or web address].

## 2023-08-19 NOTE — Plan of Care (Signed)
  Problem: Education: Goal: Knowledge of the prescribed therapeutic regimen will improve 08/19/2023 0754 by Peggyann Bower, RN Outcome: Progressing 08/19/2023 0751 by Peggyann Bower, RN Outcome: Not Progressing   Problem: Coping: Goal: Ability to identify and develop effective coping behavior will improve 08/19/2023 0754 by Peggyann Bower, RN Outcome: Progressing 08/19/2023 0751 by Peggyann Bower, RN Outcome: Not Progressing   Problem: Clinical Measurements: Goal: Quality of life will improve 08/19/2023 0754 by Peggyann Bower, RN Outcome: Progressing 08/19/2023 0751 by Peggyann Bower, RN Outcome: Not Progressing

## 2023-08-20 NOTE — Progress Notes (Signed)
 [facility]                                                                               Natalie Brown, is a 58 y.o. female, DOB - Jan 29, 1966, [id number] Admit date - 01/22/2023    Outpatient Primary MD for the patient is [name], [name], NP  LOS - 160  days    Brief summary   58 y.o. F with bipolar disorder and early onset dementia who was admitted for failure to thrive .   This is a long and complicated hospitalization spanning 2 years.  For details, see Dr Pamila Boers note summary from 12/17/22, Dr. Mardella Shadow summary from 01/22/23, or Dr. Shearon Denis summary from 07/17/23.   In brief, she was admitted in 2023 for increasingly erratic behavior and cognitive impairment.  She was evaluated by Neurology and Psychiatry, but no treatable or reversible cause of her impairment could be found.  It appears she has an early onset dementia with comorbid bipolar disorder, leading to weight loss, malnutrition and failure to thrive .   For some reason, during that admission, there was a prolonged delay for over a year in placement in memory care until October 2024, and tragically, on arriving at that facility, she exhibited agitation behaviors, and so she was denied entry and sent back to the hospital the same day.     She remained in ER holding for 6 weeks until she was transferred upstairs in Nov 2024 and has been on [facility] since.  [name] nonverbal and does not follow commands. She can ambulate with assistance.  Eats very less with help.    Assessment & Plan    Assessment and Plan:  Early onset Dementia with behavioral disturbance  Bipolar disorder Severe protein calorie malnutrition.   On Ativan  0.5 mg TID, and ensure supplementations.    Chronic kidney disease stage IIIa  Comfort care.    Hypertension Optimal BP parameters.      Malnutrition Type:  Nutrition Problem: Severe Malnutrition Etiology: chronic illness   Malnutrition Characteristics:  Signs/Symptoms: severe  fat depletion, severe muscle depletion, energy intake < or equal to 50% for > or equal to 5 days   Nutrition Interventions:  Interventions: Magic cup  Estimated body mass index is 13.32 kg/m as calculated from the following:   Height as of this encounter: 5\' 6"  (1.676 m).   Weight as of this encounter: 37.4 kg.  Code Status: DNR limited.  DVT Prophylaxis:  comfort care   Level of Care: Level of care: Med-Surg Family Communication: none at bedside.   Disposition Plan:     Remains inpatient appropriate:      Anti-infectives (From admission, onward)    None        Medications  Scheduled Meds:  feeding supplement  237 mL Oral BID BM   LORazepam   0.5 mg Oral TID   Continuous Infusions: PRN Meds:.acetaminophen  **OR** acetaminophen , guaiFENesin , lactulose , LORazepam , senna-docusate, traZODone     Subjective:   Natalie Brown was seen and examined today.  No events overnight.   Objective:   Vitals:   08/19/23 0857 08/19/23 2012 08/20/23 0518 08/20/23 0802  BP: 118/75 114/76 98/65 100/67  Pulse: 66  69 68 66  Resp: 18 16 16 19   Temp: 97.7 F (36.5 C) 98 F (36.7 C) 98 F (36.7 C) 98.1 F (36.7 C)  TempSrc: Oral Oral Oral Oral  SpO2:  100% 100% 100%  Weight:      Height:        Intake/Output Summary (Last 24 hours) at 08/20/2023 1146 Last data filed at 08/20/2023 1009 Gross per 24 hour  Intake 178 ml  Output --  Net 178 ml   Filed Weights   06/02/23 0500 07/02/23 1700 07/17/23 1531  Weight: 42 kg 42.2 kg 37.4 kg     Exam  Ill appearing cachetic looking lady not in distress.  Curled up in bed. Not interacting.  Not following commands or waking up to verbal cues.   Data Reviewed:  I have personally reviewed following labs and imaging studies   CBC Lab Results  Component Value Date   WBC 5.1 08/03/2023   RBC 4.59 08/03/2023   HGB 13.8 08/03/2023   HCT 42.3 08/03/2023   MCV 92.2 08/03/2023   MCH 30.1 08/03/2023   PLT 271 08/03/2023   MCHC  32.6 08/03/2023   RDW 13.9 08/03/2023   LYMPHSABS 2.6 07/08/2023   MONOABS 0.4 07/08/2023   EOSABS 0.1 07/08/2023   BASOSABS 0.1 07/08/2023     Last metabolic panel Lab Results  Component Value Date   NA 147 (H) 08/03/2023   K 4.3 08/03/2023   CL 113 (H) 08/03/2023   CO2 25 08/03/2023   BUN 31 (H) 08/03/2023   CREATININE 1.21 (H) 08/03/2023   GLUCOSE 91 08/03/2023   GFRNONAA 53 (L) 08/03/2023   GFRAA 65 09/19/2017   CALCIUM  9.3 08/03/2023   PHOS 3.0 03/26/2023   PROT 7.2 08/03/2023   ALBUMIN  3.3 (L) 08/03/2023   LABGLOB 2.8 06/21/2020   AGRATIO 1.5 06/21/2020   BILITOT 0.6 08/03/2023   ALKPHOS 40 08/03/2023   AST 18 08/03/2023   ALT 15 08/03/2023   ANIONGAP 9 08/03/2023    CBG (last 3)  No results for input(s): "GLUCAP" in the last 72 hours.    Coagulation Profile: No results for input(s): "INR", "PROTIME" in the last 168 hours.   Radiology Studies: No results found.     Feliciana Horn M.D. [facility] 08/20/2023, 11:46 AM  Available via Epic secure chat 7am-7pm After 7 pm, please refer to night coverage provider listed on amion.

## 2023-08-20 NOTE — Plan of Care (Signed)
  Problem: Safety: Goal: Non-violent Restraint(s) Outcome: Progressing   Problem: Education: Goal: Knowledge of the prescribed therapeutic regimen will improve Outcome: Progressing   Problem: Coping: Goal: Ability to identify and develop effective coping behavior will improve Outcome: Progressing

## 2023-08-20 NOTE — Progress Notes (Signed)
 Unable to get afternoon vitals on patient due to moving around.

## 2023-08-21 NOTE — Plan of Care (Signed)
  Problem: Safety: Goal: Non-violent Restraint(s) Outcome: Progressing   Problem: Education: Goal: Knowledge of the prescribed therapeutic regimen will improve Outcome: Progressing   Problem: Coping: Goal: Ability to identify and develop effective coping behavior will improve Outcome: Progressing   Problem: Clinical Measurements: Goal: Quality of life will improve Outcome: Progressing   Problem: Respiratory: Goal: Verbalizations of increased ease of respirations will increase Outcome: Progressing   Problem: Role Relationship: Goal: Family's ability to cope with current situation will improve Outcome: Progressing Goal: Ability to verbalize concerns, feelings, and thoughts to partner or family member will improve Outcome: Progressing   Problem: Pain Management: Goal: Satisfaction with pain management regimen will improve Outcome: Progressing

## 2023-08-21 NOTE — Progress Notes (Signed)
 [facility]                                                                               Natalie Brown, is a 58 y.o. female, DOB - 1965-08-31, [id number] Admit date - 01/22/2023    Outpatient Primary MD for the patient is [name], Natalie Pellegrini, NP  LOS - 161  days    Brief summary   58 y.o. F with bipolar disorder and early onset dementia who was admitted for failure to thrive .   This is a long and complicated hospitalization spanning 2 years.  For details, see Dr Natalie Brown note summary from 12/17/22, Dr. Mardella Brown summary from 01/22/23, or Dr. Shearon Brown summary from 07/17/23.   In brief, she was admitted in 2023 for increasingly erratic behavior and cognitive impairment.  She was evaluated by Neurology and Psychiatry, but no treatable or reversible cause of her impairment could be found.  It appears she has an early onset dementia with comorbid bipolar disorder, leading to weight loss, malnutrition and failure to thrive .   For some reason, during that admission, there was a prolonged delay for over a year in placement in memory care until October 2024, and tragically, on arriving at that facility, she exhibited agitation behaviors, and so she was denied entry and sent back to the hospital the same day.     She remained in ER holding for 6 weeks until she was transferred upstairs in Nov 2024 and has been on [facility] since.  [name] nonverbal and does not follow commands. She can ambulate with assistance.  Eats very less with help.   No events overnight.  Assessment & Plan    Assessment and Plan:  Early onset Dementia with behavioral disturbance  Bipolar disorder Severe protein calorie malnutrition.   On Ativan  0.5 mg TID, and ensure supplementations.  No agitation.    Chronic kidney disease stage IIIa  Comfort care.    Hypertension Well controlled.     Malnutrition Type:  Nutrition Problem: Severe Malnutrition Etiology: chronic illness   Malnutrition  Characteristics:  Signs/Symptoms: severe fat depletion, severe muscle depletion, energy intake < or equal to 50% for > or equal to 5 days   Nutrition Interventions:  Interventions: Magic cup  Estimated body mass index is 13.32 kg/m as calculated from the following:   Height as of this encounter: 5\' 6"  (1.676 m).   Weight as of this encounter: 37.4 kg.  Code Status: DNR limited.  DVT Prophylaxis:  comfort care   Level of Care: Level of care: Med-Surg Family Communication: none at bedside.   Disposition Plan:     Remains inpatient appropriate:      Anti-infectives (From admission, onward)    None        Medications  Scheduled Meds:  feeding supplement  237 mL Oral BID BM   LORazepam   0.5 mg Oral TID   Continuous Infusions: PRN Meds:.acetaminophen  **OR** acetaminophen , guaiFENesin , lactulose , LORazepam , senna-docusate, traZODone     Subjective:   Natalie Brown was seen and examined today.  No events overnight.   Objective:   Vitals:   08/19/23 0857 08/19/23 2012 08/20/23 0518 08/20/23 0802  BP: 118/75 114/76 98/65  100/67  Pulse: 66 69 68 66  Resp: 18 16 16 19   Temp:   98 F (36.7 C) 98.1 F (36.7 C)  TempSrc: Oral Oral Oral Oral  SpO2:  100% 100% 100%  Weight:      Height:        Intake/Output Summary (Last 24 hours) at 08/21/2023 1752 Last data filed at 08/21/2023 1530 Gross per 24 hour  Intake 2592 ml  Output --  Net 2592 ml   Filed Weights   06/02/23 0500 07/02/23 1700 07/17/23 1531  Weight: 42 kg 42.2 kg 37.4 kg     Exam  Ill appearing lady not in distress.  CVS s1s2  Lungs  clear to auscultation.   Data Reviewed:  I have personally reviewed following labs and imaging studies   CBC Lab Results  Component Value Date   WBC 5.1 08/03/2023   RBC 4.59 08/03/2023   HGB 13.8 08/03/2023   HCT 42.3 08/03/2023   MCV 92.2 08/03/2023   MCH 30.1 08/03/2023   PLT 271 08/03/2023   MCHC 32.6 08/03/2023   RDW 13.9 08/03/2023   LYMPHSABS  2.6 07/08/2023   MONOABS 0.4 07/08/2023   EOSABS 0.1 07/08/2023   BASOSABS 0.1 07/08/2023     Last metabolic panel Lab Results  Component Value Date   NA 147 (H) 08/03/2023   K 4.3 08/03/2023   CL 113 (H) 08/03/2023   CO2 25 08/03/2023   BUN 31 (H) 08/03/2023   CREATININE 1.21 (H) 08/03/2023   GLUCOSE 91 08/03/2023   GFRNONAA 53 (L) 08/03/2023   GFRAA 65 09/19/2017   CALCIUM  9.3 08/03/2023   PHOS 3.0 03/26/2023   PROT 7.2 08/03/2023   ALBUMIN  3.3 (L) 08/03/2023   LABGLOB 2.8 06/21/2020   AGRATIO 1.5 06/21/2020   BILITOT 0.6 08/03/2023   ALKPHOS 40 08/03/2023   AST 18 08/03/2023   ALT 15 08/03/2023   ANIONGAP 9 08/03/2023    CBG (last 3)  No results for input(s): "GLUCAP" in the last 72 hours.    Coagulation Profile: No results for input(s): "INR", "PROTIME" in the last 168 hours.   Radiology Studies: No results found.     Natalie Brown M.D. [facility] 08/21/2023, 5:52 PM  Available via Epic secure chat 7am-7pm After 7 pm, please refer to night coverage provider listed on amion.

## 2023-08-22 NOTE — Plan of Care (Signed)
  Problem: Safety: Goal: Non-violent Restraint(s) Outcome: Progressing   Problem: Education: Goal: Knowledge of the prescribed therapeutic regimen will improve Outcome: Progressing   Problem: Coping: Goal: Ability to identify and develop effective coping behavior will improve Outcome: Progressing   Problem: Clinical Measurements: Goal: Quality of life will improve Outcome: Progressing   Problem: Respiratory: Goal: Verbalizations of increased ease of respirations will increase Outcome: Progressing   Problem: Role Relationship: Goal: Family's ability to cope with current situation will improve Outcome: Progressing Goal: Ability to verbalize concerns, feelings, and thoughts to partner or family member will improve Outcome: Progressing   Problem: Pain Management: Goal: Satisfaction with pain management regimen will improve Outcome: Progressing

## 2023-08-22 NOTE — Progress Notes (Signed)
 [facility]                                                                               Natalie Brown, is a 58 y.o. female, DOB - 03/18/1966, UEA:[id number] Admit date - 01/22/2023    Outpatient Primary MD for the patient is [name], [name], NP  LOS - 162  days    Brief summary   58 y.o. F with bipolar disorder and early onset dementia who was admitted for failure to thrive .   This is a long and complicated hospitalization spanning 2 years.  For details, see Dr Natalie Brown note summary from 12/17/22, Dr. Mardella Brown summary from 01/22/23, or Dr. Shearon Brown summary from 07/17/23.   In brief, she was admitted in 2023 for increasingly erratic behavior and cognitive impairment.  She was evaluated by Neurology and Psychiatry, but no treatable or reversible cause of her impairment could be found.  It appears she has an early onset dementia with comorbid bipolar disorder, leading to weight loss, malnutrition and failure to thrive .   For some reason, during that admission, there was a prolonged delay for over a year in placement in memory care until October 2024, and tragically, on arriving at that facility, she exhibited agitation behaviors, and so she was denied entry and sent back to the hospital the same day.     She remained in ER holding for 6 weeks until she was transferred upstairs in Nov 2024 and has been on [facility] since.  [name] nonverbal and does not follow commands. She can ambulate with assistance.  Eats very less with help.   No events overnight.  Assessment & Plan    Assessment and Plan:  Early onset Dementia with behavioral disturbance  Bipolar disorder Severe protein calorie malnutrition.   On Ativan  0.5 mg TID, and ensure supplementations.  No agitation.    Chronic kidney disease stage IIIa  Comfort care.    Hypertension BP running low this morning.    Requested RN for assistance during every meals.      Malnutrition Type:  Nutrition Problem: Severe  Malnutrition Etiology: chronic illness   Malnutrition Characteristics:  Signs/Symptoms: severe fat depletion, severe muscle depletion, energy intake < or equal to 50% for > or equal to 5 days   Nutrition Interventions:  Interventions: Magic cup  Estimated body mass index is 13.32 kg/m as calculated from the following:   Height as of this encounter: 5\' 6"  (1.676 m).   Weight as of this encounter: 37.4 kg.  Code Status: DNR limited.  DVT Prophylaxis:  comfort care   Level of Care: Level of care: Med-Surg Family Communication: none at bedside.   Disposition Plan:     Remains inpatient appropriate:  comfort care.     Anti-infectives (From admission, onward)    None        Medications  Scheduled Meds:  feeding supplement  237 mL Oral BID BM   LORazepam   0.5 mg Oral TID   Continuous Infusions: PRN Meds:.acetaminophen  **OR** acetaminophen , guaiFENesin , lactulose , LORazepam , senna-docusate, traZODone     Subjective:   Natalie Brown was seen and examined today.  No events overnight.   Objective:  Vitals:   08/20/23 0802 08/21/23 1800 08/22/23 0613 08/22/23 0754  BP: 100/67 (!) 100/57  (!) 88/61  Pulse: 66 67  77  Resp: 19 19    Temp:  98.1 F (36.7 C) 98.7 F (37.1 C)   TempSrc: Oral Oral    SpO2: 100%     Weight:      Height:        Intake/Output Summary (Last 24 hours) at 08/22/2023 1219 Last data filed at 08/22/2023 1001 Gross per 24 hour  Intake 2040 ml  Output --  Net 2040 ml   Filed Weights   06/02/23 0500 07/02/23 1700 07/17/23 1531  Weight: 42 kg 42.2 kg 37.4 kg     Exam Ill appearing cachetic looking lady , not in distress. Eating breakfast while laying down and curled up.  CVS s1s2 Lungs Clear.  No pedal edema  Data Reviewed:  I have personally reviewed following labs and imaging studies   CBC Lab Results  Component Value Date   WBC 5.1 08/03/2023   RBC 4.59 08/03/2023   HGB 13.8 08/03/2023   HCT 42.3 08/03/2023   MCV 92.2  08/03/2023   MCH 30.1 08/03/2023   PLT 271 08/03/2023   MCHC 32.6 08/03/2023   RDW 13.9 08/03/2023   LYMPHSABS 2.6 07/08/2023   MONOABS 0.4 07/08/2023   EOSABS 0.1 07/08/2023   BASOSABS 0.1 07/08/2023     Last metabolic panel Lab Results  Component Value Date   NA 147 (H) 08/03/2023   K 4.3 08/03/2023   CL 113 (H) 08/03/2023   CO2 25 08/03/2023   BUN 31 (H) 08/03/2023   CREATININE 1.21 (H) 08/03/2023   GLUCOSE 91 08/03/2023   GFRNONAA 53 (L) 08/03/2023   GFRAA 65 09/19/2017   CALCIUM  9.3 08/03/2023   PHOS 3.0 03/26/2023   PROT 7.2 08/03/2023   ALBUMIN  3.3 (L) 08/03/2023   LABGLOB 2.8 06/21/2020   AGRATIO 1.5 06/21/2020   BILITOT 0.6 08/03/2023   ALKPHOS 40 08/03/2023   AST 18 08/03/2023   ALT 15 08/03/2023   ANIONGAP 9 08/03/2023    CBG (last 3)  No results for input(s): "GLUCAP" in the last 72 hours.    Coagulation Profile: No results for input(s): "INR", "PROTIME" in the last 168 hours.   Radiology Studies: No results found.     Feliciana Horn M.D. [facility] 08/22/2023, 12:19 PM  Available via Epic secure chat 7am-7pm After 7 pm, please refer to night coverage provider listed on amion.

## 2023-08-23 NOTE — Progress Notes (Signed)
 [facility]                                                                               Natalie Brown, is a 58 y.o. female, DOB - 06-Nov-1965, AVW:[id number] Admit date - 01/22/2023    Outpatient Primary MD for the patient is [name], [name], NP  LOS - 163  days    Brief summary   58 y.o. F with bipolar disorder and early onset dementia who was admitted for failure to thrive .   This is a long and complicated hospitalization spanning 2 years.  For details, see Dr Pamila Boers note summary from 12/17/22, Dr. Mardella Shadow summary from 01/22/23, or Dr. Shearon Denis summary from 07/17/23.   In brief, she was admitted in 2023 for increasingly erratic behavior and cognitive impairment.  She was evaluated by Neurology and Psychiatry, but no treatable or reversible cause of her impairment could be found.  It appears she has an early onset dementia with comorbid bipolar disorder, leading to weight loss, malnutrition and failure to thrive .   For some reason, during that admission, there was a prolonged delay for over a year in placement in memory care until October 2024, and tragically, on arriving at that facility, she exhibited agitation behaviors, and so she was denied entry and sent back to the hospital the same day.     She remained in ER holding for 6 weeks until she was transferred upstairs in Nov 2024 and has been on [facility] since.  [name] nonverbal and does not follow commands. She can ambulate with assistance. No new events overnight. As per the RN and NT patient is eating 100% of meals.  Assessment & Plan    Assessment and Plan:  Early onset Dementia with behavioral disturbance  Bipolar disorder Severe protein calorie malnutrition.   On Ativan  0.5 mg TID, and ensure supplementations.  No agitation.    Chronic kidney disease stage IIIa  Comfort care.    Hypertension Bp parameters are optimal.    Requested RN for assistance during every meals.      Malnutrition  Type:  Nutrition Problem: Severe Malnutrition Etiology: chronic illness   Malnutrition Characteristics:  Signs/Symptoms: severe fat depletion, severe muscle depletion, energy intake < or equal to 50% for > or equal to 5 days   Nutrition Interventions:  Interventions: Magic cup  Estimated body mass index is 13.32 kg/m as calculated from the following:   Height as of this encounter: 5\' 6"  (1.676 m).   Weight as of this encounter: 37.4 kg.  Code Status: DNR limited.  DVT Prophylaxis:  comfort care   Level of Care: Level of care: Med-Surg Family Communication: none at bedside.   Disposition Plan:     Remains inpatient appropriate:  comfort care.     Anti-infectives (From admission, onward)    None        Medications  Scheduled Meds:  feeding supplement  237 mL Oral BID BM   LORazepam   0.5 mg Oral TID   Continuous Infusions: PRN Meds:.acetaminophen  **OR** acetaminophen , guaiFENesin , lactulose , LORazepam , senna-docusate, traZODone     Subjective:   Natalie Brown was seen and examined today.  No events overnight.  Comfortable.  Objective:   Vitals:   08/22/23 0613 08/22/23 0754 08/22/23 1643 08/23/23 0801  BP:  (!) 88/61 99/65 100/70  Pulse:  77 (!) 108 79  Resp:   18 15  Temp: 98.7 F (37.1 C)  98.3 F (36.8 C) (!) 97.5 F (36.4 C)  TempSrc:   Axillary Axillary  SpO2:    99%  Weight:      Height:        Intake/Output Summary (Last 24 hours) at 08/23/2023 1026 Last data filed at 08/23/2023 0930 Gross per 24 hour  Intake 1320 ml  Output --  Net 1320 ml   Filed Weights   06/02/23 0500 07/02/23 1700 07/17/23 1531  Weight: 42 kg 42.2 kg 37.4 kg     Exam ILl appearing lady, curled up, no in distress.  CVS S1s2  Lungs clear Extremities no edema Skin warm.  Does not respond to questions. Does not follow commands.   Data Reviewed:  I have personally reviewed following labs and imaging studies   CBC Lab Results  Component Value Date   WBC  5.1 08/03/2023   RBC 4.59 08/03/2023   HGB 13.8 08/03/2023   HCT 42.3 08/03/2023   MCV 92.2 08/03/2023   MCH 30.1 08/03/2023   PLT 271 08/03/2023   MCHC 32.6 08/03/2023   RDW 13.9 08/03/2023   LYMPHSABS 2.6 07/08/2023   MONOABS 0.4 07/08/2023   EOSABS 0.1 07/08/2023   BASOSABS 0.1 07/08/2023     Last metabolic panel Lab Results  Component Value Date   NA 147 (H) 08/03/2023   K 4.3 08/03/2023   CL 113 (H) 08/03/2023   CO2 25 08/03/2023   BUN 31 (H) 08/03/2023   CREATININE 1.21 (H) 08/03/2023   GLUCOSE 91 08/03/2023   GFRNONAA 53 (L) 08/03/2023   GFRAA 65 09/19/2017   CALCIUM  9.3 08/03/2023   PHOS 3.0 03/26/2023   PROT 7.2 08/03/2023   ALBUMIN  3.3 (L) 08/03/2023   LABGLOB 2.8 06/21/2020   AGRATIO 1.5 06/21/2020   BILITOT 0.6 08/03/2023   ALKPHOS 40 08/03/2023   AST 18 08/03/2023   ALT 15 08/03/2023   ANIONGAP 9 08/03/2023    CBG (last 3)  No results for input(s): "GLUCAP" in the last 72 hours.    Coagulation Profile: No results for input(s): "INR", "PROTIME" in the last 168 hours.   Radiology Studies: No results found.     Feliciana Horn M.D. [facility] 08/23/2023, 10:26 AM  Available via Epic secure chat 7am-7pm After 7 pm, please refer to night coverage provider listed on amion.

## 2023-08-23 NOTE — Plan of Care (Signed)
  Problem: Safety: Goal: Non-violent Restraint(s) Outcome: Progressing   Problem: Education: Goal: Knowledge of the prescribed therapeutic regimen will improve Outcome: Progressing   Problem: Coping: Goal: Ability to identify and develop effective coping behavior will improve Outcome: Progressing   Problem: Clinical Measurements: Goal: Quality of life will improve Outcome: Progressing   Problem: Respiratory: Goal: Verbalizations of increased ease of respirations will increase Outcome: Progressing   Problem: Role Relationship: Goal: Family's ability to cope with current situation will improve Outcome: Progressing Goal: Ability to verbalize concerns, feelings, and thoughts to partner or family member will improve Outcome: Progressing   Problem: Pain Management: Goal: Satisfaction with pain management regimen will improve Outcome: Progressing

## 2023-08-24 NOTE — Plan of Care (Signed)
  Problem: Safety: Goal: Non-violent Restraint(s) 08/24/2023 2108 by Rand Burrs, RN Outcome: Progressing 08/24/2023 2108 by Rand Burrs, RN Outcome: Progressing   Problem: Education: Goal: Knowledge of the prescribed therapeutic regimen will improve 08/24/2023 2108 by Rand Burrs, RN Outcome: Progressing 08/24/2023 2108 by Rand Burrs, RN Outcome: Progressing   Problem: Coping: Goal: Ability to identify and develop effective coping behavior will improve 08/24/2023 2108 by Rand Burrs, RN Outcome: Progressing 08/24/2023 2108 by Rand Burrs, RN Outcome: Progressing   Problem: Clinical Measurements: Goal: Quality of life will improve 08/24/2023 2108 by Rand Burrs, RN Outcome: Progressing 08/24/2023 2108 by Rand Burrs, RN Outcome: Progressing   Problem: Respiratory: Goal: Verbalizations of increased ease of respirations will increase 08/24/2023 2108 by Rand Burrs, RN Outcome: Progressing 08/24/2023 2108 by Rand Burrs, RN Outcome: Progressing   Problem: Role Relationship: Goal: Family's ability to cope with current situation will improve 08/24/2023 2108 by Rand Burrs, RN Outcome: Progressing 08/24/2023 2108 by Rand Burrs, RN Outcome: Progressing Goal: Ability to verbalize concerns, feelings, and thoughts to partner or family member will improve 08/24/2023 2108 by Rand Burrs, RN Outcome: Progressing 08/24/2023 2108 by Rand Burrs, RN Outcome: Progressing   Problem: Pain Management: Goal: Satisfaction with pain management regimen will improve 08/24/2023 2108 by Rand Burrs, RN Outcome: Progressing 08/24/2023 2108 by Rand Burrs, RN Outcome: Progressing

## 2023-08-24 NOTE — Plan of Care (Signed)
  Problem: Safety: Goal: Non-violent Restraint(s) Outcome: Progressing   Problem: Education: Goal: Knowledge of the prescribed therapeutic regimen will improve Outcome: Progressing   Problem: Coping: Goal: Ability to identify and develop effective coping behavior will improve Outcome: Progressing   Problem: Clinical Measurements: Goal: Quality of life will improve Outcome: Progressing   Problem: Respiratory: Goal: Verbalizations of increased ease of respirations will increase Outcome: Progressing   Problem: Role Relationship: Goal: Family's ability to cope with current situation will improve Outcome: Progressing Goal: Ability to verbalize concerns, feelings, and thoughts to partner or family member will improve Outcome: Progressing   Problem: Pain Management: Goal: Satisfaction with pain management regimen will improve Outcome: Progressing

## 2023-08-24 NOTE — Progress Notes (Signed)
 [facility]                                                                               Natalie Brown, is a 58 y.o. female, DOB - 09-Jun-1965, ZOX:[id number] Admit date - 01/22/2023    Outpatient Primary MD for the patient is [name], [name], NP  LOS - 164  days    Brief summary   58 y.o. F with bipolar disorder and early onset dementia who was admitted for failure to thrive .   This is a long and complicated hospitalization spanning 2 years.  For details, see Dr Pamila Boers note summary from 12/17/22, Dr. Mardella Shadow summary from 01/22/23, or Dr. Shearon Denis summary from 07/17/23.   In brief, she was admitted in 2023 for increasingly erratic behavior and cognitive impairment.  She was evaluated by Neurology and Psychiatry, but no treatable or reversible cause of her impairment could be found.  It appears she has an early onset dementia with comorbid bipolar disorder, leading to weight loss, malnutrition and failure to thrive .   For some reason, during that admission, there was a prolonged delay for over a year in placement in memory care until October 2024, and tragically, on arriving at that facility, she exhibited agitation behaviors, and so she was denied entry and sent back to the hospital the same day.     She remained in ER holding for 6 weeks until she was transferred upstairs in Nov 2024 and has been on [facility] since.  [name] nonverbal and does not follow commands. She can ambulate with assistance. No new events overnight. As per the RN and NT patient is eating 100% of meals.  She appears comfortable.  Assessment & Plan    Assessment and Plan:  Early onset Dementia with behavioral disturbance  Bipolar disorder Severe protein calorie malnutrition.   On Ativan  0.5 mg TID, and ensure supplementations.  No agitation.    Chronic kidney disease stage IIIa  Comfort care.    Hypertension Bp parameters are well controlled.    Hypernatremia Encourage free water  intake.     Requested RN for assistance during every meals.      Malnutrition Type:  Nutrition Problem: Severe Malnutrition Etiology: chronic illness   Malnutrition Characteristics:  Signs/Symptoms: severe fat depletion, severe muscle depletion, energy intake < or equal to 50% for > or equal to 5 days   Nutrition Interventions:  Interventions: Magic cup  Estimated body mass index is 13.32 kg/m as calculated from the following:   Height as of this encounter: 5\' 6"  (1.676 m).   Weight as of this encounter: 37.4 kg.  Code Status: DNR limited.  DVT Prophylaxis:  comfort care   Level of Care: Level of care: Med-Surg Family Communication: none at bedside.   Disposition Plan:     Remains inpatient appropriate:  comfort care.     Anti-infectives (From admission, onward)    None        Medications  Scheduled Meds:  feeding supplement  237 mL Oral BID BM   LORazepam   0.5 mg Oral TID   Continuous Infusions: PRN Meds:.acetaminophen  **OR** acetaminophen , guaiFENesin , lactulose , LORazepam , senna-docusate, traZODone     Subjective:  Natalie Brown was seen and examined today.  No events overnight, eating 100% of her meals.  Comfortable.  Objective:   Vitals:   08/22/23 0754 08/22/23 1643 08/23/23 0801 08/23/23 2002  BP: (!) 88/61 99/65 100/70 95/67  Pulse: 77 (!) 108 79 79  Resp:  18 15 18   Temp:  98.3 F (36.8 C) (!) 97.5 F (36.4 C) 98.1 F (36.7 C)  TempSrc:  Axillary Axillary Oral  SpO2:   99% 99%  Weight:      Height:        Intake/Output Summary (Last 24 hours) at 08/24/2023 1929 Last data filed at 08/24/2023 1306 Gross per 24 hour  Intake 2400 ml  Output --  Net 2400 ml   Filed Weights   06/02/23 0500 07/02/23 1700 07/17/23 1531  Weight: 42 kg 42.2 kg 37.4 kg     Exam Ill appearing lady, not in distress.  Curled up in bed.  Comfortable.   Data Reviewed:  I have personally reviewed following labs and imaging studies   CBC Lab Results   Component Value Date   WBC 5.1 08/03/2023   RBC 4.59 08/03/2023   HGB 13.8 08/03/2023   HCT 42.3 08/03/2023   MCV 92.2 08/03/2023   MCH 30.1 08/03/2023   PLT 271 08/03/2023   MCHC 32.6 08/03/2023   RDW 13.9 08/03/2023   LYMPHSABS 2.6 07/08/2023   MONOABS 0.4 07/08/2023   EOSABS 0.1 07/08/2023   BASOSABS 0.1 07/08/2023     Last metabolic panel Lab Results  Component Value Date   NA 147 (H) 08/03/2023   K 4.3 08/03/2023   CL 113 (H) 08/03/2023   CO2 25 08/03/2023   BUN 31 (H) 08/03/2023   CREATININE 1.21 (H) 08/03/2023   GLUCOSE 91 08/03/2023   GFRNONAA 53 (L) 08/03/2023   GFRAA 65 09/19/2017   CALCIUM  9.3 08/03/2023   PHOS 3.0 03/26/2023   PROT 7.2 08/03/2023   ALBUMIN  3.3 (L) 08/03/2023   LABGLOB 2.8 06/21/2020   AGRATIO 1.5 06/21/2020   BILITOT 0.6 08/03/2023   ALKPHOS 40 08/03/2023   AST 18 08/03/2023   ALT 15 08/03/2023   ANIONGAP 9 08/03/2023    CBG (last 3)  No results for input(s): "GLUCAP" in the last 72 hours.    Coagulation Profile: No results for input(s): "INR", "PROTIME" in the last 168 hours.   Radiology Studies: No results found.     Feliciana Horn M.D. [facility] 08/24/2023, 7:29 PM  Available via Epic secure chat 7am-7pm After 7 pm, please refer to night coverage provider listed on amion.

## 2023-08-25 NOTE — Plan of Care (Signed)
  Problem: Safety: Goal: Non-violent Restraint(s) Outcome: Progressing   Problem: Education: Goal: Knowledge of the prescribed therapeutic regimen will improve Outcome: Progressing   Problem: Coping: Goal: Ability to identify and develop effective coping behavior will improve Outcome: Progressing   Problem: Clinical Measurements: Goal: Quality of life will improve Outcome: Progressing   Problem: Respiratory: Goal: Verbalizations of increased ease of respirations will increase Outcome: Progressing   Problem: Role Relationship: Goal: Family's ability to cope with current situation will improve Outcome: Progressing Goal: Ability to verbalize concerns, feelings, and thoughts to partner or family member will improve Outcome: Progressing   Problem: Pain Management: Goal: Satisfaction with pain management regimen will improve Outcome: Progressing

## 2023-08-25 NOTE — Progress Notes (Signed)
 [facility]                                                                               Natalie Brown, is a 58 y.o. female, DOB - [birth year], AVW:[id number] Admit date - 01/22/2023    Outpatient Primary MD for the patient is [name], [name], NP  LOS - 165  days    Brief summary   58 y.o. F with bipolar disorder and early onset dementia who was admitted for failure to thrive .   This is a long and complicated hospitalization spanning 2 years.  For details, see Dr Pamila Boers note summary from 12/17/22, Dr. Mardella Shadow summary from 01/22/23, or Dr. Shearon Denis summary from 07/17/23.   In brief, she was admitted in 2023 for increasingly erratic behavior and cognitive impairment.  She was evaluated by Neurology and Psychiatry, but no treatable or reversible cause of her impairment could be found.  It appears she has an early onset dementia with comorbid bipolar disorder, leading to weight loss, malnutrition and failure to thrive .   For some reason, during that admission, there was a prolonged delay for over a year in placement in memory care until October 2024, and tragically, on arriving at that facility, she exhibited agitation behaviors, and so she was denied entry and sent back to the hospital the same day.     She remained in ER holding for 6 weeks until she was transferred upstairs in Nov 2024 and has been on [facility] since.  [name] nonverbal and does not follow commands. She can ambulate with assistance. No new events overnight.   Assessment & Plan    Assessment and Plan:  Early onset Dementia with behavioral disturbance  Bipolar disorder Severe protein calorie malnutrition.   On Ativan  0.5 mg TID, and ensure supplementations.  Restless today. Ativan  given.    Chronic kidney disease stage IIIa  Comfort care.    Hypertension Bp parameters are well controlled.    Hypernatremia Encourage free water  intake.    Requested RN for assistance during every meals.       Malnutrition Type:  Nutrition Problem: Severe Malnutrition Etiology: chronic illness   Malnutrition Characteristics:  Signs/Symptoms: severe fat depletion, severe muscle depletion, energy intake < or equal to 50% for > or equal to 5 days   Nutrition Interventions:  Interventions: Magic cup  Estimated body mass index is 13.32 kg/m as calculated from the following:   Height as of this encounter: 5\' 6"  (1.676 m).   Weight as of this encounter: 37.4 kg.  Code Status: DNR limited.  DVT Prophylaxis:  comfort care   Level of Care: Level of care: Med-Surg Family Communication: none at bedside.   Disposition Plan:     Remains inpatient appropriate:  comfort care.     Anti-infectives (From admission, onward)    None        Medications  Scheduled Meds:  feeding supplement  237 mL Oral BID BM   LORazepam   0.5 mg Oral TID   Continuous Infusions: PRN Meds:.acetaminophen  **OR** acetaminophen , guaiFENesin , lactulose , LORazepam , senna-docusate, traZODone     Subjective:   Natalie Brown was seen and examined today.  No new complaints.  Objective:   Vitals:   08/22/23 0754 08/22/23 1643 08/23/23 0801 08/23/23 2002  BP: (!) 88/61 99/65 100/70 95/67  Pulse: 77 (!) 108 79 79  Resp:  18 15 18   Temp:  98.3 F (36.8 C) (!) 97.5 F (36.4 C) 98.1 F (36.7 C)  TempSrc:  Axillary Axillary Oral  SpO2:   99% 99%  Weight:      Height:        Intake/Output Summary (Last 24 hours) at 08/25/2023 1312 Last data filed at 08/25/2023 1255 Gross per 24 hour  Intake 840 ml  Output --  Net 840 ml   Filed Weights   06/02/23 0500 07/02/23 1700 07/17/23 1531  Weight: 42 kg 42.2 kg 37.4 kg     Exam Ill appearing lady not in distress.  Cvs s1s2  Lungs clear to auscultation.   Data Reviewed:  I have personally reviewed following labs and imaging studies   CBC Lab Results  Component Value Date   WBC 5.1 08/03/2023   RBC 4.59 08/03/2023   HGB 13.8 08/03/2023   HCT  42.3 08/03/2023   MCV 92.2 08/03/2023   MCH 30.1 08/03/2023   PLT 271 08/03/2023   MCHC 32.6 08/03/2023   RDW 13.9 08/03/2023   LYMPHSABS 2.6 07/08/2023   MONOABS 0.4 07/08/2023   EOSABS 0.1 07/08/2023   BASOSABS 0.1 07/08/2023     Last metabolic panel Lab Results  Component Value Date   NA 147 (H) 08/03/2023   K 4.3 08/03/2023   CL 113 (H) 08/03/2023   CO2 25 08/03/2023   BUN 31 (H) 08/03/2023   CREATININE 1.21 (H) 08/03/2023   GLUCOSE 91 08/03/2023   GFRNONAA 53 (L) 08/03/2023   GFRAA 65 09/19/2017   CALCIUM  9.3 08/03/2023   PHOS 3.0 03/26/2023   PROT 7.2 08/03/2023   ALBUMIN  3.3 (L) 08/03/2023   LABGLOB 2.8 06/21/2020   AGRATIO 1.5 06/21/2020   BILITOT 0.6 08/03/2023   ALKPHOS 40 08/03/2023   AST 18 08/03/2023   ALT 15 08/03/2023   ANIONGAP 9 08/03/2023    CBG (last 3)  No results for input(s): "GLUCAP" in the last 72 hours.    Coagulation Profile: No results for input(s): "INR", "PROTIME" in the last 168 hours.   Radiology Studies: No results found.     Feliciana Horn M.D. [facility] 08/25/2023, 1:12 PM  Available via Epic secure chat 7am-7pm After 7 pm, please refer to night coverage provider listed on amion.

## 2023-08-26 NOTE — Progress Notes (Signed)
 [facility]                                                                               Natalie Brown, is a 58 y.o. female, DOB - [birth year], [id number] Admit date - 01/22/2023    Outpatient Primary MD for the patient is [name], [name], NP  LOS - 166  days    Brief summary   58 y.o. F with bipolar disorder and early onset dementia who was admitted for failure to thrive .   This is a long and complicated hospitalization spanning 2 years.  For details, see Dr Pamila Boers note summary from 12/17/22, Dr. Mardella Shadow summary from 01/22/23, or Dr. Shearon Denis summary from 07/17/23.   In brief, she was admitted in 2023 for increasingly erratic behavior and cognitive impairment.  She was evaluated by Neurology and Psychiatry, but no treatable or reversible cause of her impairment could be found.  It appears she has an early onset dementia with comorbid bipolar disorder, leading to weight loss, malnutrition and failure to thrive .   For some reason, during that admission, there was a prolonged delay for over a year in placement in memory care until October 2024, and tragically, on arriving at that facility, she exhibited agitation behaviors, and so she was denied entry and sent back to the hospital the same day.     She remained in ER holding for 6 weeks until she was transferred upstairs in Nov 2024 and has been on [facility] since.  [name] nonverbal and does not follow commands. She can ambulate with assistance. No new events overnight.   Assessment & Plan    Assessment and Plan:  Early onset Dementia with behavioral disturbance  Bipolar disorder Severe protein calorie malnutrition.   On Ativan  0.5 mg TID, and ensure supplementations.   No new complaints    Chronic kidney disease stage IIIa  Comfort care.    Hypertension Bp parameters are optimal.    Hypernatremia Encourage free water  intake.    Requested RN for assistance during every meals.      Malnutrition  Type:  Nutrition Problem: Severe Malnutrition Etiology: chronic illness   Malnutrition Characteristics:  Signs/Symptoms: severe fat depletion, severe muscle depletion, energy intake < or equal to 50% for > or equal to 5 days   Nutrition Interventions:  Interventions: Magic cup  Estimated body mass index is 13.32 kg/m as calculated from the following:   Height as of this encounter: 5\' 6"  (1.676 m).   Weight as of this encounter: 37.4 kg.  Code Status: DNR limited.  DVT Prophylaxis:  comfort care   Level of Care: Level of care: Med-Surg Family Communication: none at bedside.   Disposition Plan:     Remains inpatient appropriate:  comfort care.     Anti-infectives (From admission, onward)    None        Medications  Scheduled Meds:  feeding supplement  237 mL Oral BID BM   LORazepam   0.5 mg Oral TID   Continuous Infusions: PRN Meds:.acetaminophen  **OR** acetaminophen , guaiFENesin , lactulose , LORazepam , senna-docusate, traZODone     Subjective:   Natalie Brown was seen and examined today.   No new complaints.  Objective:   Vitals:   08/25/23 1547 08/25/23 2123 08/26/23 1100 08/26/23 1604  BP: 97/66 103/63 (!) 111/99 (!) 127/93  Pulse: (!) 108 67 95 89  Resp:  18    Temp:  97.6 F (36.4 C)    TempSrc:  Oral    SpO2:  100%  100%  Weight:      Height:        Intake/Output Summary (Last 24 hours) at 08/26/2023 1843 Last data filed at 08/26/2023 0941 Gross per 24 hour  Intake 1080 ml  Output --  Net 1080 ml   Filed Weights   06/02/23 0500 07/02/23 1700 07/17/23 1531  Weight: 42 kg 42.2 kg 37.4 kg     Exam Ill appearing lady curled up in bed.  [facility] s1s2  Lungs clear.   Data Reviewed:  I have personally reviewed following labs and imaging studies   CBC Lab Results  Component Value Date   WBC 5.1 08/03/2023   RBC 4.59 08/03/2023   HGB 13.8 08/03/2023   HCT 42.3 08/03/2023   MCV 92.2 08/03/2023   MCH 30.1 08/03/2023   PLT 271 08/03/2023    MCHC 32.6 08/03/2023   RDW 13.9 08/03/2023   LYMPHSABS 2.6 07/08/2023   MONOABS 0.4 07/08/2023   EOSABS 0.1 07/08/2023   BASOSABS 0.1 07/08/2023     Last metabolic panel Lab Results  Component Value Date   NA 147 (H) 08/03/2023   K 4.3 08/03/2023   CL 113 (H) 08/03/2023   CO2 25 08/03/2023   BUN 31 (H) 08/03/2023   CREATININE 1.21 (H) 08/03/2023   GLUCOSE 91 08/03/2023   GFRNONAA 53 (L) 08/03/2023   GFRAA 65 09/19/2017   CALCIUM  9.3 08/03/2023   PHOS 3.0 03/26/2023   PROT 7.2 08/03/2023   ALBUMIN  3.3 (L) 08/03/2023   LABGLOB 2.8 06/21/2020   AGRATIO 1.5 06/21/2020   BILITOT 0.6 08/03/2023   ALKPHOS 40 08/03/2023   AST 18 08/03/2023   ALT 15 08/03/2023   ANIONGAP 9 08/03/2023    CBG (last 3)  No results for input(s): "GLUCAP" in the last 72 hours.    Coagulation Profile: No results for input(s): "INR", "PROTIME" in the last 168 hours.   Radiology Studies: No results found.     Feliciana Horn M.D. [facility] 08/26/2023, 6:43 PM  Available via Epic secure chat 7am-7pm After 7 pm, please refer to night coverage provider listed on amion.

## 2023-08-27 NOTE — Plan of Care (Signed)
  Problem: Role Relationship: Goal: Family's ability to cope with current situation will improve Outcome: Progressing   Problem: Pain Management: Goal: Satisfaction with pain management regimen will improve Outcome: Progressing   Problem: Safety: Goal: Non-violent Restraint(s) Outcome: Not Progressing   Problem: Education: Goal: Knowledge of the prescribed therapeutic regimen will improve Outcome: Not Progressing   Problem: Coping: Goal: Ability to identify and develop effective coping behavior will improve Outcome: Not Progressing   Problem: Clinical Measurements: Goal: Quality of life will improve Outcome: Not Progressing   Problem: Respiratory: Goal: Verbalizations of increased ease of respirations will increase Outcome: Not Progressing   Problem: Role Relationship: Goal: Ability to verbalize concerns, feelings, and thoughts to partner or family member will improve Outcome: Not Progressing

## 2023-08-27 NOTE — Plan of Care (Signed)
  Problem: Safety: Goal: Non-violent Restraint(s) Outcome: Progressing   Problem: Education: Goal: Knowledge of the prescribed therapeutic regimen will improve Outcome: Progressing   Problem: Coping: Goal: Ability to identify and develop effective coping behavior will improve Outcome: Progressing   Problem: Clinical Measurements: Goal: Quality of life will improve Outcome: Progressing   Problem: Respiratory: Goal: Verbalizations of increased ease of respirations will increase Outcome: Progressing   Problem: Role Relationship: Goal: Family's ability to cope with current situation will improve Outcome: Progressing Goal: Ability to verbalize concerns, feelings, and thoughts to partner or family member will improve Outcome: Progressing   Problem: Pain Management: Goal: Satisfaction with pain management regimen will improve Outcome: Progressing

## 2023-08-27 NOTE — Progress Notes (Signed)
 Progress Note    Natalie Brown   [id number]  DOB: Apr 09, 1966  DOA: 01/22/2023     167 PCP: Darol Elizabeth, NP  Initial CC: Altered mentation  Hospital Course: 58 y.o. F with bipolar disorder and early onset dementia who was admitted for failure to thrive .   This is a long and complicated hospitalization spanning 2 years.  For details, see Dr Pamila Boers note summary from 12/17/22, Dr. Mardella Shadow summary from 01/22/23, or Dr. Shearon Denis summary from 07/17/23.   In brief, she was admitted in 2023 for increasingly erratic behavior and cognitive impairment.  She was evaluated by Neurology and Psychiatry, but no treatable or reversible cause of her impairment could be found.  It appears she has an early onset dementia with comorbid bipolar disorder, leading to weight loss, malnutrition and failure to thrive .   For some reason, during that admission, there was a prolonged delay for over a year in placement in memory care until October 2024, and on arriving at that facility, she exhibited agitation behaviors, and so she was denied entry and sent back to the hospital the same day.     She remained in ER holding for 6 weeks until she was transferred upstairs in Nov 2024 and has been on [facility] since.  [name] nonverbal and does not follow commands. She can ambulate with assistance. [name] comfort care.  Assessment and Plan:   Early onset Dementia with behavioral disturbance  Bipolar disorder Severe protein calorie malnutrition.  - On Ativan  0.5 mg TID, and ensure supplementations.  - No new complaints  - Continue assistance with meals    Chronic kidney disease stage IIIa  Comfort care.     Hypertension Bp parameters are optimal.    Hypernatremia Encourage free water  intake.  Interval History:  Laying in bed in fetal position getting fed lunch.  Thin and does not speak still.  Known to me from prior hospitalization.  I last saw her August 2024  Old records reviewed in assessment of this  patient  Antimicrobials:   DVT prophylaxis:     Code Status:   Code Status: Limited: Do not attempt resuscitation (DNR) -DNR-LIMITED -Do Not Intubate/DNI   Mobility Assessment (Last 72 Hours)     Mobility Assessment     Row Name 08/27/23 1300 08/26/23 2353 08/26/23 1100 08/25/23 2153 08/25/23 2150   Does patient have an order for bedrest or is patient medically unstable No - Continue assessment No - Continue assessment No - Continue assessment No - Continue assessment No - Continue assessment   What is the highest level of mobility based on the progressive mobility assessment? Level 5 (Walks with assist in room/hall) - Balance while stepping forward/back and can walk in room with assist - Complete Level 5 (Walks with assist in room/hall) - Balance while stepping forward/back and can walk in room with assist - Complete Level 4 (Walks with assist in room) - Balance while marching in place and cannot step forward and back - Complete Level 5 (Walks with assist in room/hall) - Balance while stepping forward/back and can walk in room with assist - Complete Level 5 (Walks with assist in room/hall) - Balance while stepping forward/back and can walk in room with assist - Complete   Is the above level different from baseline mobility prior to current illness? No - Consider discontinuing PT/OT No - Consider discontinuing PT/OT No - Consider discontinuing PT/OT No - Consider discontinuing PT/OT No - Consider discontinuing PT/OT  Row Name 08/25/23 1000 08/24/23 2100         Does patient have an order for bedrest or is patient medically unstable No - Continue assessment No - Continue assessment      What is the highest level of mobility based on the progressive mobility assessment? Level 4 (Walks with assist in room) - Balance while marching in place and cannot step forward and back - Complete Level 5 (Walks with assist in room/hall) - Balance while stepping forward/back and can walk in room with assist  - Complete      Is the above level different from baseline mobility prior to current illness? No - Consider discontinuing PT/OT No - Consider discontinuing PT/OT               Barriers to discharge: placement Disposition Plan:  Comfort care Status is: Inpt  Objective: Blood pressure 121/64, pulse 77, temperature 98.3 F (36.8 C), temperature source Oral, resp. rate 18, height 5\' 6"  (1.676 m), weight 37.4 kg, SpO2 100%.  Examination:  Physical Exam Constitutional:      Comments: Chronically ill-appearing frail woman laying in bed in no distress  HENT:     Head: Normocephalic and atraumatic.     Mouth/Throat:     Mouth: Mucous membranes are moist.  Eyes:     Extraocular Movements: Extraocular movements intact.  Cardiovascular:     Rate and Rhythm: Normal rate and regular rhythm.  Pulmonary:     Effort: Pulmonary effort is normal. No respiratory distress.     Breath sounds: Normal breath sounds. No wheezing.  Abdominal:     General: Bowel sounds are normal. There is no distension.     Palpations: Abdomen is soft.     Tenderness: There is no abdominal tenderness.  Musculoskeletal:        General: Normal range of motion.     Cervical back: Normal range of motion and neck supple.  Skin:    General: Skin is warm and dry.  Neurological:     Comments: Nonverbal; does not follow commands      Consultants:    Procedures:    Data Reviewed: No results found for this or any previous visit (from the past 24 hours).  I have reviewed pertinent nursing notes, vitals, labs, and images as necessary. I have ordered labwork to follow up on as indicated.  I have reviewed the last notes from staff over past 24 hours. I have discussed patient's care plan and test results with nursing staff, CM/SW, and other staff as appropriate.    LOS: 167 days   Faith Homes, MD [facility] 08/27/2023, 2:17 PM

## 2023-08-28 NOTE — Progress Notes (Signed)
 Progress Note    Natalie Brown   [id number]:[id number]  DOB: Sep 27, 1965  DOA: 01/22/2023     168 PCP: Darol Elizabeth, NP  Initial CC: Altered mentation  Hospital Course: 58 y.o. F with bipolar disorder and early onset dementia who was admitted for failure to thrive .   This is a long and complicated hospitalization spanning 2 years.  For details, see Dr Pamila Boers note summary from 12/17/22, Dr. Mardella Shadow summary from 01/22/23, or Dr. Shearon Denis summary from 07/17/23.   In brief, she was admitted in 2023 for increasingly erratic behavior and cognitive impairment.  She was evaluated by Neurology and Psychiatry, but no treatable or reversible cause of her impairment could be found.  It appears she has an early onset dementia with comorbid bipolar disorder, leading to weight loss, malnutrition and failure to thrive .   For some reason, during that admission, there was a prolonged delay for over a year in placement in memory care until October 2024, and on arriving at that facility, she exhibited agitation behaviors, and so she was denied entry and sent back to the hospital the same day.     She remained in ER holding for 6 weeks until she was transferred upstairs in Nov 2024 and has been on [facility] since.  [name] nonverbal and does not follow commands. She can ambulate with assistance. [name] comfort care.  Assessment and Plan:   Early onset Dementia with behavioral disturbance  Bipolar disorder Severe protein calorie malnutrition.  - On Ativan  0.5 mg TID, and ensure supplementations.  - No new complaints  - Continue assistance with meals    Chronic kidney disease stage IIIa  Comfort care.     Hypertension Bp parameters are optimal.    Hypernatremia Encourage free water  intake.  Interval History:  Laying in bed in fetal position. Does not interact which is baseline.   Old records reviewed in assessment of this patient  Antimicrobials:   DVT prophylaxis:     Code Status:   Code Status:  Limited: Do not attempt resuscitation (DNR) -DNR-LIMITED -Do Not Intubate/DNI   Mobility Assessment (Last 72 Hours)     Mobility Assessment     Row Name 08/28/23 1151 08/28/23 1100 08/28/23 0300 08/27/23 1300 08/26/23 2353   Does patient have an order for bedrest or is patient medically unstable No - Continue assessment No - Continue assessment No - Continue assessment No - Continue assessment No - Continue assessment   What is the highest level of mobility based on the progressive mobility assessment? Level 5 (Walks with assist in room/hall) - Balance while stepping forward/back and can walk in room with assist - Complete Level 5 (Walks with assist in room/hall) - Balance while stepping forward/back and can walk in room with assist - Complete Level 5 (Walks with assist in room/hall) - Balance while stepping forward/back and can walk in room with assist - Complete Level 5 (Walks with assist in room/hall) - Balance while stepping forward/back and can walk in room with assist - Complete Level 5 (Walks with assist in room/hall) - Balance while stepping forward/back and can walk in room with assist - Complete   Is the above level different from baseline mobility prior to current illness? No - Consider discontinuing PT/OT No - Consider discontinuing PT/OT No - Consider discontinuing PT/OT No - Consider discontinuing PT/OT No - Consider discontinuing PT/OT    Row Name 08/26/23 1100 08/25/23 2153 08/25/23 2150       Does patient  have an order for bedrest or is patient medically unstable No - Continue assessment No - Continue assessment No - Continue assessment     What is the highest level of mobility based on the progressive mobility assessment? Level 4 (Walks with assist in room) - Balance while marching in place and cannot step forward and back - Complete Level 5 (Walks with assist in room/hall) - Balance while stepping forward/back and can walk in room with assist - Complete Level 5 (Walks with assist in  room/hall) - Balance while stepping forward/back and can walk in room with assist - Complete     Is the above level different from baseline mobility prior to current illness? No - Consider discontinuing PT/OT No - Consider discontinuing PT/OT No - Consider discontinuing PT/OT              Barriers to discharge: placement Disposition Plan:  Comfort care Status is: Inpt  Objective: Blood pressure 111/70, pulse 62, temperature 98.3 F (36.8 C), resp. rate 18, height 5\' 6"  (1.676 m), weight 37.4 kg, SpO2 100%.  Examination:  Physical Exam Constitutional:      Comments: Chronically ill-appearing frail woman laying in bed in no distress  HENT:     Head: Normocephalic and atraumatic.     Mouth/Throat:     Mouth: Mucous membranes are moist.  Eyes:     Extraocular Movements: Extraocular movements intact.  Cardiovascular:     Rate and Rhythm: Normal rate and regular rhythm.  Pulmonary:     Effort: Pulmonary effort is normal. No respiratory distress.     Breath sounds: Normal breath sounds. No wheezing.  Abdominal:     General: Bowel sounds are normal. There is no distension.     Palpations: Abdomen is soft.     Tenderness: There is no abdominal tenderness.  Musculoskeletal:        General: Normal range of motion.     Cervical back: Normal range of motion and neck supple.  Skin:    General: Skin is warm and dry.  Neurological:     Comments: Nonverbal; does not follow commands      Consultants:    Procedures:    Data Reviewed: No results found for this or any previous visit (from the past 24 hours).  I have reviewed pertinent nursing notes, vitals, labs, and images as necessary. I have ordered labwork to follow up on as indicated.  I have reviewed the last notes from staff over past 24 hours. I have discussed patient's care plan and test results with nursing staff, CM/SW, and other staff as appropriate.    LOS: 168 days   Faith Homes, MD [facility] 08/28/2023, 2:59 PM

## 2023-08-28 NOTE — Plan of Care (Signed)
  Problem: Safety: Goal: Non-violent Restraint(s) Outcome: Progressing   Problem: Coping: Goal: Ability to identify and develop effective coping behavior will improve Outcome: Progressing   Problem: Role Relationship: Goal: Family's ability to cope with current situation will improve Outcome: Progressing

## 2023-08-29 NOTE — Progress Notes (Signed)
 Progress Note    Natalie Brown   [id number]  DOB: Oct 30, 1965  DOA: 01/22/2023     169 PCP: Darol Elizabeth, NP  Initial CC: Altered mentation  Hospital Course: 58 y.o. F with bipolar disorder and early onset dementia who was admitted for failure to thrive .   This is a long and complicated hospitalization spanning 2 years.  For details, see Dr Pamila Boers note summary from 12/17/22, Dr. Mardella Shadow summary from 01/22/23, or Dr. Shearon Denis summary from 07/17/23.   In brief, she was admitted in 2023 for increasingly erratic behavior and cognitive impairment.  She was evaluated by Neurology and Psychiatry, but no treatable or reversible cause of her impairment could be found.  It appears she has an early onset dementia with comorbid bipolar disorder, leading to weight loss, malnutrition and failure to thrive .   For some reason, during that admission, there was a prolonged delay for over a year in placement in memory care until October 2024, and on arriving at that facility, she exhibited agitation behaviors, and so she was denied entry and sent back to the hospital the same day.     She remained in ER holding for 6 weeks until she was transferred upstairs in Nov 2024 and has been on [facility] since.  [name] nonverbal and does not follow commands. She can ambulate with assistance. [name] comfort care.  Assessment and Plan:   Early onset Dementia with behavioral disturbance  Bipolar disorder Severe protein calorie malnutrition.  - On Ativan  0.5 mg TID, and ensure supplementations.  - No new complaints  - Continue assistance with meals    Chronic kidney disease stage IIIa  Comfort care.     Hypertension Bp parameters are optimal.    Hypernatremia Encourage free water  intake  Interval History:  Laying in bed in fetal position. Does not interact which is baseline. No acute needs at this time.   Old records reviewed in assessment of this patient  Antimicrobials:   DVT prophylaxis:      Code Status:   Code Status: Limited: Do not attempt resuscitation (DNR) -DNR-LIMITED -Do Not Intubate/DNI   Mobility Assessment (Last 72 Hours)     Mobility Assessment     Row Name 08/29/23 0800 08/28/23 2030 08/28/23 1151 08/28/23 1100 08/28/23 0300   Does patient have an order for bedrest or is patient medically unstable No - Continue assessment No - Continue assessment No - Continue assessment No - Continue assessment No - Continue assessment   What is the highest level of mobility based on the progressive mobility assessment? Level 1 (Bedfast) - Unable to balance while sitting on edge of bed Level 5 (Walks with assist in room/hall) - Balance while stepping forward/back and can walk in room with assist - Complete Level 5 (Walks with assist in room/hall) - Balance while stepping forward/back and can walk in room with assist - Complete Level 5 (Walks with assist in room/hall) - Balance while stepping forward/back and can walk in room with assist - Complete Level 5 (Walks with assist in room/hall) - Balance while stepping forward/back and can walk in room with assist - Complete   Is the above level different from baseline mobility prior to current illness? No - Consider discontinuing PT/OT No - Consider discontinuing PT/OT No - Consider discontinuing PT/OT No - Consider discontinuing PT/OT No - Consider discontinuing PT/OT    Row Name 08/27/23 1300 08/26/23 2353         Does patient have an  order for bedrest or is patient medically unstable No - Continue assessment No - Continue assessment      What is the highest level of mobility based on the progressive mobility assessment? Level 5 (Walks with assist in room/hall) - Balance while stepping forward/back and can walk in room with assist - Complete Level 5 (Walks with assist in room/hall) - Balance while stepping forward/back and can walk in room with assist - Complete      Is the above level different from baseline mobility prior to current  illness? No - Consider discontinuing PT/OT No - Consider discontinuing PT/OT               Barriers to discharge: placement Disposition Plan:  Comfort care Status is: Inpt  Objective: Blood pressure (!) 139/116, pulse 62, temperature 98.3 F (36.8 C), resp. rate 18, height 5\' 6"  (1.676 m), weight 37.4 kg, SpO2 100%.  Examination:  Physical Exam Constitutional:      Comments: Chronically ill-appearing frail woman laying in bed in no distress  HENT:     Head: Normocephalic and atraumatic.     Mouth/Throat:     Mouth: Mucous membranes are moist.  Eyes:     Extraocular Movements: Extraocular movements intact.  Cardiovascular:     Rate and Rhythm: Normal rate and regular rhythm.  Pulmonary:     Effort: Pulmonary effort is normal. No respiratory distress.     Breath sounds: Normal breath sounds. No wheezing.  Abdominal:     General: Bowel sounds are normal. There is no distension.     Palpations: Abdomen is soft.     Tenderness: There is no abdominal tenderness.  Musculoskeletal:        General: Normal range of motion.     Cervical back: Normal range of motion and neck supple.  Skin:    General: Skin is warm and dry.  Neurological:     Comments: Nonverbal; does not follow commands      Consultants:    Procedures:    Data Reviewed: No results found for this or any previous visit (from the past 24 hours).  I have reviewed pertinent nursing notes, vitals, labs, and images as necessary. I have ordered labwork to follow up on as indicated.  I have reviewed the last notes from staff over past 24 hours. I have discussed patient's care plan and test results with nursing staff, CM/SW, and other staff as appropriate.    LOS: 169 days   Faith Homes, MD [facility] 08/29/2023, 4:02 PM

## 2023-08-29 NOTE — Plan of Care (Signed)
  Problem: Safety: Goal: Non-violent Restraint(s) Outcome: Not Progressing   Problem: Education: Goal: Knowledge of the prescribed therapeutic regimen will improve Outcome: Not Progressing   Problem: Coping: Goal: Ability to identify and develop effective coping behavior will improve Outcome: Not Progressing   Problem: Clinical Measurements: Goal: Quality of life will improve Outcome: Not Progressing   Problem: Respiratory: Goal: Verbalizations of increased ease of respirations will increase Outcome: Not Progressing   Problem: Role Relationship: Goal: Family's ability to cope with current situation will improve Outcome: Not Progressing Goal: Ability to verbalize concerns, feelings, and thoughts to partner or family member will improve Outcome: Not Progressing   Problem: Pain Management: Goal: Satisfaction with pain management regimen will improve Outcome: Not Progressing

## 2023-08-30 NOTE — Plan of Care (Signed)
  Problem: Safety: Goal: Non-violent Restraint(s) Outcome: Progressing   Problem: Education: Goal: Knowledge of the prescribed therapeutic regimen will improve Outcome: Progressing   Problem: Coping: Goal: Ability to identify and develop effective coping behavior will improve Outcome: Progressing   Problem: Clinical Measurements: Goal: Quality of life will improve Outcome: Progressing   Problem: Respiratory: Goal: Verbalizations of increased ease of respirations will increase Outcome: Progressing   Problem: Role Relationship: Goal: Family's ability to cope with current situation will improve Outcome: Progressing Goal: Ability to verbalize concerns, feelings, and thoughts to partner or family member will improve Outcome: Progressing   Problem: Pain Management: Goal: Satisfaction with pain management regimen will improve Outcome: Progressing

## 2023-08-30 NOTE — Progress Notes (Signed)
 Progress Note    Natalie Brown   ZOX:[id number]  DOB: 02-02-1966  DOA: 01/22/2023     170 PCP: Darol Elizabeth, NP  Initial CC: Altered mentation  Hospital Course: 58 y.o. F with bipolar disorder and early onset dementia who was admitted for failure to thrive .   This is a long and complicated hospitalization spanning 2 years.  For details, see Dr Pamila Boers note summary from 12/17/22, Dr. Mardella Shadow summary from 01/22/23, or Dr. Shearon Denis summary from 07/17/23.   In brief, she was admitted in 2023 for increasingly erratic behavior and cognitive impairment.  She was evaluated by Neurology and Psychiatry, but no treatable or reversible cause of her impairment could be found.  It appears she has an early onset dementia with comorbid bipolar disorder, leading to weight loss, malnutrition and failure to thrive .   For some reason, during that admission, there was a prolonged delay for over a year in placement in memory care until October 2024, and on arriving at that facility, she exhibited agitation behaviors, and so she was denied entry and sent back to the hospital the same day.     She remained in ER holding for 6 weeks until she was transferred upstairs in Nov 2024 and has been on [facility] since.  [name] nonverbal and does not follow commands. She can ambulate with assistance. [name] comfort care.  Assessment and Plan:   Early onset Dementia with behavioral disturbance  Bipolar disorder Severe protein calorie malnutrition.  - On Ativan  0.5 mg TID, and ensure supplementations.  - No new complaints  - Continue assistance with meals    Chronic kidney disease stage IIIa  Comfort care.     Hypertension Bp parameters are optimal.    Hypernatremia Encourage free water  intake  Interval History:  Laying in bed in fetal position.  Does not interact which is baseline.  No acute needs at this time.   Old records reviewed in assessment of this patient  Antimicrobials:   DVT prophylaxis:      Code Status:   Code Status: Limited: Do not attempt resuscitation (DNR) -DNR-LIMITED -Do Not Intubate/DNI   Mobility Assessment (Last 72 Hours)     Mobility Assessment     Row Name 08/30/23 0920 08/29/23 0800 08/28/23 2030 08/28/23 1151 08/28/23 1100   Does patient have an order for bedrest or is patient medically unstable No - Continue assessment No - Continue assessment No - Continue assessment No - Continue assessment No - Continue assessment   What is the highest level of mobility based on the progressive mobility assessment? Level 1 (Bedfast) - Unable to balance while sitting on edge of bed Level 1 (Bedfast) - Unable to balance while sitting on edge of bed Level 5 (Walks with assist in room/hall) - Balance while stepping forward/back and can walk in room with assist - Complete Level 5 (Walks with assist in room/hall) - Balance while stepping forward/back and can walk in room with assist - Complete Level 5 (Walks with assist in room/hall) - Balance while stepping forward/back and can walk in room with assist - Complete   Is the above level different from baseline mobility prior to current illness? No - Consider discontinuing PT/OT No - Consider discontinuing PT/OT No - Consider discontinuing PT/OT No - Consider discontinuing PT/OT No - Consider discontinuing PT/OT    Row Name 08/28/23 0300           Does patient have an order for bedrest or is patient  medically unstable No - Continue assessment       What is the highest level of mobility based on the progressive mobility assessment? Level 5 (Walks with assist in room/hall) - Balance while stepping forward/back and can walk in room with assist - Complete       Is the above level different from baseline mobility prior to current illness? No - Consider discontinuing PT/OT                Barriers to discharge: placement Disposition Plan:  Comfort care Status is: Inpt  Objective: Blood pressure 128/68, pulse 62, temperature 98.3  F (36.8 C), resp. rate 18, height 5\' 6"  (1.676 m), weight 37.4 kg, SpO2 100%.  Examination:  Physical Exam Constitutional:      Comments: Chronically ill-appearing frail woman laying in bed in no distress  HENT:     Head: Normocephalic and atraumatic.     Mouth/Throat:     Mouth: Mucous membranes are moist.  Eyes:     Extraocular Movements: Extraocular movements intact.  Cardiovascular:     Rate and Rhythm: Normal rate and regular rhythm.  Pulmonary:     Effort: Pulmonary effort is normal. No respiratory distress.     Breath sounds: Normal breath sounds. No wheezing.  Abdominal:     General: Bowel sounds are normal. There is no distension.     Palpations: Abdomen is soft.     Tenderness: There is no abdominal tenderness.  Musculoskeletal:        General: Normal range of motion.     Cervical back: Normal range of motion and neck supple.  Skin:    General: Skin is warm and dry.  Neurological:     Comments: Nonverbal; does not follow commands      Consultants:    Procedures:    Data Reviewed: No results found for this or any previous visit (from the past 24 hours).  I have reviewed pertinent nursing notes, vitals, labs, and images as necessary. I have ordered labwork to follow up on as indicated.  I have reviewed the last notes from staff over past 24 hours. I have discussed patient's care plan and test results with nursing staff, CM/SW, and other staff as appropriate.    LOS: 170 days   Faith Homes, MD [facility] 08/30/2023, 2:33 PM

## 2023-08-31 NOTE — Progress Notes (Signed)
 Progress Note    Natalie Brown   WUJ:[id number]  DOB: Nov 20, 1965  DOA: 01/22/2023     171 PCP: Darol Elizabeth, NP  Initial CC: Altered mentation  Hospital Course: 58 y.o. F with bipolar disorder and early onset dementia who was admitted for failure to thrive .   This is a long and complicated hospitalization spanning 2 years.  For details, see Dr Pamila Boers note summary from 12/17/22, Dr. Mardella Shadow summary from 01/22/23, or Dr. Shearon Denis summary from 07/17/23.   In brief, she was admitted in 2023 for increasingly erratic behavior and cognitive impairment.  She was evaluated by Neurology and Psychiatry, but no treatable or reversible cause of her impairment could be found.  It appears she has an early onset dementia with comorbid bipolar disorder, leading to weight loss, malnutrition and failure to thrive .   For some reason, during that admission, there was a prolonged delay for over a year in placement in memory care until October 2024, and on arriving at that facility, she exhibited agitation behaviors, and so she was denied entry and sent back to the hospital the same day.     She remained in ER holding for 6 weeks until she was transferred upstairs in Nov 2024 and has been on [facility] since.  [name] nonverbal and does not follow commands. She can ambulate with assistance. [name] comfort care.  Assessment and Plan:   Early onset Dementia with behavioral disturbance  Bipolar disorder Severe protein calorie malnutrition.  - On Ativan  0.5 mg TID, and ensure supplementations.  - No new complaints  - Continue assistance with meals    Chronic kidney disease stage IIIa  Comfort care.     Hypertension Bp parameters are optimal.    Hypernatremia Encourage free water  intake  Interval History:  Laying in bed in fetal position.  Does not interact which is baseline.  No acute needs at this time.   Old records reviewed in assessment of this patient  Antimicrobials:   DVT prophylaxis:      Code Status:   Code Status: Limited: Do not attempt resuscitation (DNR) -DNR-LIMITED -Do Not Intubate/DNI   Mobility Assessment (Last 72 Hours)     Mobility Assessment     Row Name 08/31/23 1000 08/30/23 2130 08/30/23 0920 08/29/23 0800 08/28/23 2030   Does patient have an order for bedrest or is patient medically unstable No - Continue assessment No - Continue assessment No - Continue assessment No - Continue assessment No - Continue assessment   What is the highest level of mobility based on the progressive mobility assessment? Level 1 (Bedfast) - Unable to balance while sitting on edge of bed Level 1 (Bedfast) - Unable to balance while sitting on edge of bed Level 1 (Bedfast) - Unable to balance while sitting on edge of bed Level 1 (Bedfast) - Unable to balance while sitting on edge of bed Level 5 (Walks with assist in room/hall) - Balance while stepping forward/back and can walk in room with assist - Complete   Is the above level different from baseline mobility prior to current illness? No - Consider discontinuing PT/OT No - Consider discontinuing PT/OT No - Consider discontinuing PT/OT No - Consider discontinuing PT/OT No - Consider discontinuing PT/OT            Barriers to discharge: placement Disposition Plan:  Comfort care Status is: Inpt  Objective: Blood pressure 114/63, pulse 60, temperature 97.6 F (36.4 C), temperature source Axillary, resp. rate 18, height 5'  6" (1.676 m), weight 37.4 kg, SpO2 96%.  Examination:  Physical Exam Constitutional:      Comments: Chronically ill-appearing frail woman laying in bed in no distress  HENT:     Head: Normocephalic and atraumatic.     Mouth/Throat:     Mouth: Mucous membranes are moist.  Eyes:     Extraocular Movements: Extraocular movements intact.  Cardiovascular:     Rate and Rhythm: Normal rate and regular rhythm.  Pulmonary:     Effort: Pulmonary effort is normal. No respiratory distress.     Breath sounds:  Normal breath sounds. No wheezing.  Abdominal:     General: Bowel sounds are normal. There is no distension.     Palpations: Abdomen is soft.     Tenderness: There is no abdominal tenderness.  Musculoskeletal:        General: Normal range of motion.     Cervical back: Normal range of motion and neck supple.  Skin:    General: Skin is warm and dry.  Neurological:     Comments: Nonverbal; does not follow commands      Consultants:    Procedures:    Data Reviewed: No results found for this or any previous visit (from the past 24 hours).  I have reviewed pertinent nursing notes, vitals, labs, and images as necessary. I have ordered labwork to follow up on as indicated.  I have reviewed the last notes from staff over past 24 hours. I have discussed patient's care plan and test results with nursing staff, CM/SW, and other staff as appropriate.    LOS: 171 days   Faith Homes, MD [facility] 08/31/2023, 2:17 PM

## 2023-09-01 NOTE — Plan of Care (Signed)
  Problem: Safety: Goal: Non-violent Restraint(s) 09/01/2023 1912 by Aundria Leech, RN Outcome: Progressing 09/01/2023 1912 by Aundria Leech, RN Outcome: Progressing   Problem: Education: Goal: Knowledge of the prescribed therapeutic regimen will improve 09/01/2023 1912 by Aundria Leech, RN Outcome: Progressing 09/01/2023 1912 by Aundria Leech, RN Outcome: Progressing   Problem: Coping: Goal: Ability to identify and develop effective coping behavior will improve 09/01/2023 1912 by Aundria Leech, RN Outcome: Progressing 09/01/2023 1912 by Aundria Leech, RN Outcome: Progressing   Problem: Clinical Measurements: Goal: Quality of life will improve 09/01/2023 1912 by Aundria Leech, RN Outcome: Progressing 09/01/2023 1912 by Aundria Leech, RN Outcome: Progressing   Problem: Respiratory: Goal: Verbalizations of increased ease of respirations will increase 09/01/2023 1912 by Aundria Leech, RN Outcome: Progressing 09/01/2023 1912 by Aundria Leech, RN Outcome: Progressing   Problem: Role Relationship: Goal: Family's ability to cope with current situation will improve 09/01/2023 1912 by Aundria Leech, RN Outcome: Progressing 09/01/2023 1912 by Aundria Leech, RN Outcome: Progressing Goal: Ability to verbalize concerns, feelings, and thoughts to partner or family member will improve 09/01/2023 1912 by Aundria Leech, RN Outcome: Progressing 09/01/2023 1912 by Aundria Leech, RN Outcome: Progressing   Problem: Pain Management: Goal: Satisfaction with pain management regimen will improve 09/01/2023 1912 by Aundria Leech, RN Outcome: Progressing 09/01/2023 1912 by Aundria Leech, RN Outcome: Progressing

## 2023-09-01 NOTE — Progress Notes (Signed)
 Progress Note    Natalie Brown   ZOX:[id number]  DOB: 08/09/1965  DOA: 01/22/2023     172 PCP: Darol Elizabeth, NP  Initial CC: Altered mentation  Hospital Course: 58 y.o. F with bipolar disorder and early onset dementia who was admitted for failure to thrive .   This is a long and complicated hospitalization spanning 2 years.  For details, see Dr Pamila Boers note summary from 12/17/22, Dr. Mardella Shadow summary from 01/22/23, or Dr. Shearon Denis summary from 07/17/23.   In brief, she was admitted in 2023 for increasingly erratic behavior and cognitive impairment.  She was evaluated by Neurology and Psychiatry, but no treatable or reversible cause of her impairment could be found.  It appears she has an early onset dementia with comorbid bipolar disorder, leading to weight loss, malnutrition and failure to thrive .   For some reason, during that admission, there was a prolonged delay for over a year in placement in memory care until October 2024, and on arriving at that facility, she exhibited agitation behaviors, and so she was denied entry and sent back to the hospital the same day.     She remained in ER holding for 6 weeks until she was transferred upstairs in Nov 2024 and has been on [facility] since.  [name] nonverbal and does not follow commands. She can ambulate with assistance. [name] comfort care.  Assessment and Plan:   Early onset Dementia with behavioral disturbance  Bipolar disorder Severe protein calorie malnutrition.  - On Ativan  0.5 mg TID, and ensure supplementations.  - No new complaints  - Continue assistance with meals    Chronic kidney disease stage IIIa  Comfort care.     Hypertension Bp parameters are optimal.    Hypernatremia Encourage free water  intake  Interval History:  Laying in bed in fetal position.  Does not interact which is baseline.  No acute needs at this time.   Old records reviewed in assessment of this patient  Antimicrobials:   DVT prophylaxis:      Code Status:   Code Status: Limited: Do not attempt resuscitation (DNR) -DNR-LIMITED -Do Not Intubate/DNI   Mobility Assessment (Last 72 Hours)     Mobility Assessment     Row Name 09/01/23 0800 08/31/23 2125 08/31/23 1000 08/30/23 2130 08/30/23 0920   Does patient have an order for bedrest or is patient medically unstable No - Continue assessment No - Continue assessment No - Continue assessment No - Continue assessment No - Continue assessment   What is the highest level of mobility based on the progressive mobility assessment? Level 5 (Walks with assist in room/hall) - Balance while stepping forward/back and can walk in room with assist - Complete Level 1 (Bedfast) - Unable to balance while sitting on edge of bed Level 1 (Bedfast) - Unable to balance while sitting on edge of bed Level 1 (Bedfast) - Unable to balance while sitting on edge of bed Level 1 (Bedfast) - Unable to balance while sitting on edge of bed   Is the above level different from baseline mobility prior to current illness? No - Consider discontinuing PT/OT No - Consider discontinuing PT/OT No - Consider discontinuing PT/OT No - Consider discontinuing PT/OT No - Consider discontinuing PT/OT            Barriers to discharge: placement Disposition Plan:  Comfort care Status is: Inpt  Objective: Blood pressure 109/67, pulse 63, temperature 98 F (36.7 C), resp. rate 18, height 5\' 6"  (1.676 m),  weight 37.4 kg, SpO2 100%.  Examination:  Physical Exam Constitutional:      Comments: Chronically ill-appearing frail woman laying in bed in no distress  HENT:     Head: Normocephalic and atraumatic.     Mouth/Throat:     Mouth: Mucous membranes are moist.  Eyes:     Extraocular Movements: Extraocular movements intact.  Cardiovascular:     Rate and Rhythm: Normal rate and regular rhythm.  Pulmonary:     Effort: Pulmonary effort is normal. No respiratory distress.     Breath sounds: Normal breath sounds. No wheezing.   Abdominal:     General: Bowel sounds are normal. There is no distension.     Palpations: Abdomen is soft.     Tenderness: There is no abdominal tenderness.  Musculoskeletal:        General: Normal range of motion.     Cervical back: Normal range of motion and neck supple.  Skin:    General: Skin is warm and dry.  Neurological:     Comments: Nonverbal; does not follow commands      Consultants:    Procedures:    Data Reviewed: No results found for this or any previous visit (from the past 24 hours).  I have reviewed pertinent nursing notes, vitals, labs, and images as necessary. I have ordered labwork to follow up on as indicated.  I have reviewed the last notes from staff over past 24 hours. I have discussed patient's care plan and test results with nursing staff, CM/SW, and other staff as appropriate.    LOS: 172 days   Faith Homes, MD [facility] 09/01/2023, 2:37 PM

## 2023-09-01 NOTE — Plan of Care (Signed)
  Problem: Safety: Goal: Non-violent Restraint(s) Outcome: Progressing   Problem: Education: Goal: Knowledge of the prescribed therapeutic regimen will improve Outcome: Progressing   Problem: Coping: Goal: Ability to identify and develop effective coping behavior will improve Outcome: Progressing   Problem: Clinical Measurements: Goal: Quality of life will improve Outcome: Progressing   Problem: Respiratory: Goal: Verbalizations of increased ease of respirations will increase Outcome: Progressing   Problem: Role Relationship: Goal: Family's ability to cope with current situation will improve Outcome: Progressing Goal: Ability to verbalize concerns, feelings, and thoughts to partner or family member will improve Outcome: Progressing   Problem: Pain Management: Goal: Satisfaction with pain management regimen will improve Outcome: Progressing

## 2023-09-02 NOTE — Progress Notes (Signed)
 Pt bath, enclosure bed changed and pt walked around unit.

## 2023-09-02 NOTE — Progress Notes (Signed)
 Progress Note    Natalie Brown   [id number]:[id number]  DOB: [birth year]  DOA: 01/22/2023     173 PCP: Natalie Elizabeth, NP  Initial CC: Altered mentation  Hospital Course: 58 y.o. F with bipolar disorder and early onset dementia who was admitted for failure to thrive .   This is a long and complicated hospitalization spanning 2 years.  For details, see Dr Natalie Brown note summary from 12/17/22, Dr. Mardella Brown summary from 01/22/23, or Dr. Shearon Brown summary from 07/17/23.   In brief, she was admitted in 2023 for increasingly erratic behavior and cognitive impairment.  She was evaluated by Neurology and Psychiatry, but no treatable or reversible cause of her impairment could be found.  It appears she has an early onset dementia with comorbid bipolar disorder, leading to weight loss, malnutrition and failure to thrive .   For some reason, during that admission, there was a prolonged delay for over a year in placement in memory care until October 2024, and on arriving at that facility, she exhibited agitation behaviors, and so she was denied entry and sent back to the hospital the same day.     She remained in ER holding for 6 weeks until she was transferred upstairs in Nov 2024 and has been on [facility] since.  [name] nonverbal and does not follow commands. She can ambulate with assistance. [name] comfort care.  Assessment and Plan:   Early onset Dementia with behavioral disturbance  Bipolar disorder Severe protein calorie malnutrition.  - On Ativan  0.5 mg TID, and ensure supplementations.  - No new complaints  - Continue assistance with meals    Chronic kidney disease stage IIIa  Comfort care.     Hypertension Bp parameters are optimal.    Hypernatremia Encourage free water  intake  Interval History:  Laying in bed in fetal position.  Does not interact which is baseline.  No acute needs at this time.   Old records reviewed in assessment of this patient  Antimicrobials:   DVT prophylaxis:      Code Status:   Code Status: Limited: Do not attempt resuscitation (DNR) -DNR-LIMITED -Do Not Intubate/DNI   Mobility Assessment (Last 72 Hours)     Mobility Assessment     Row Name 09/02/23 0820 09/01/23 2200 09/01/23 0800 08/31/23 2125 08/31/23 1000   Does patient have an order for bedrest or is patient medically unstable No - Continue assessment No - Continue assessment No - Continue assessment No - Continue assessment No - Continue assessment   What is the highest level of mobility based on the progressive mobility assessment? Level 5 (Walks with assist in room/hall) - Balance while stepping forward/back and can walk in room with assist - Complete Level 5 (Walks with assist in room/hall) - Balance while stepping forward/back and can walk in room with assist - Complete Level 5 (Walks with assist in room/hall) - Balance while stepping forward/back and can walk in room with assist - Complete Level 1 (Bedfast) - Unable to balance while sitting on edge of bed Level 1 (Bedfast) - Unable to balance while sitting on edge of bed   Is the above level different from baseline mobility prior to current illness? No - Consider discontinuing PT/OT No - Consider discontinuing PT/OT No - Consider discontinuing PT/OT No - Consider discontinuing PT/OT No - Consider discontinuing PT/OT    Row Name 08/30/23 2130           Does patient have an order for bedrest or is patient  medically unstable No - Continue assessment       What is the highest level of mobility based on the progressive mobility assessment? Level 1 (Bedfast) - Unable to balance while sitting on edge of bed       Is the above level different from baseline mobility prior to current illness? No - Consider discontinuing PT/OT                Barriers to discharge: placement Disposition Plan:  Comfort care Status is: Inpt  Objective: Blood pressure 109/67, pulse 63, temperature 98 F (36.7 C), resp. rate 18, height 5\' 6"  (1.676 m),  weight 37.4 kg, SpO2 100%.  Examination:  Physical Exam Constitutional:      Comments: Chronically ill-appearing frail woman laying in bed in no distress  HENT:     Head: Normocephalic and atraumatic.     Mouth/Throat:     Mouth: Mucous membranes are moist.  Eyes:     Extraocular Movements: Extraocular movements intact.  Cardiovascular:     Rate and Rhythm: Normal rate and regular rhythm.  Pulmonary:     Effort: Pulmonary effort is normal. No respiratory distress.     Breath sounds: Normal breath sounds. No wheezing.  Abdominal:     General: Bowel sounds are normal. There is no distension.     Palpations: Abdomen is soft.     Tenderness: There is no abdominal tenderness.  Musculoskeletal:        General: Normal range of motion.     Cervical back: Normal range of motion and neck supple.  Skin:    General: Skin is warm and dry.  Neurological:     Comments: Nonverbal; does not follow commands      Consultants:    Procedures:    Data Reviewed: No results found for this or any previous visit (from the past 24 hours).  I have reviewed pertinent nursing notes, vitals, labs, and images as necessary. I have ordered labwork to follow up on as indicated.  I have reviewed the last notes from staff over past 24 hours. I have discussed patient's care plan and test results with nursing staff, CM/SW, and other staff as appropriate.    LOS: 173 days   Faith Homes, MD [facility] 09/02/2023, 3:57 PM

## 2023-09-02 NOTE — Plan of Care (Signed)
  Problem: Safety: Goal: Non-violent Restraint(s) Outcome: Progressing   Problem: Education: Goal: Knowledge of the prescribed therapeutic regimen will improve Outcome: Progressing   Problem: Coping: Goal: Ability to identify and develop effective coping behavior will improve Outcome: Progressing   Problem: Clinical Measurements: Goal: Quality of life will improve Outcome: Progressing   Problem: Respiratory: Goal: Verbalizations of increased ease of respirations will increase Outcome: Progressing   Problem: Role Relationship: Goal: Family's ability to cope with current situation will improve Outcome: Progressing Goal: Ability to verbalize concerns, feelings, and thoughts to partner or family member will improve Outcome: Progressing   Problem: Pain Management: Goal: Satisfaction with pain management regimen will improve Outcome: Progressing

## 2023-09-03 NOTE — Progress Notes (Signed)
 Progress Note    Natalie Brown   [id number]  DOB: 1965/08/10  DOA: 01/22/2023     174 PCP: Darol Elizabeth, NP  Initial CC: Altered mentation  Hospital Course: 58 y.o. F with bipolar disorder and early onset dementia who was admitted for failure to thrive .   This is a long and complicated hospitalization spanning 2 years.  For details, see Dr Pamila Boers note summary from 12/17/22, Dr. Mardella Shadow summary from 01/22/23, or Dr. Shearon Denis summary from 07/17/23.   In brief, she was admitted in 2023 for increasingly erratic behavior and cognitive impairment.  She was evaluated by Neurology and Psychiatry, but no treatable or reversible cause of her impairment could be found.  It appears she has an early onset dementia with comorbid bipolar disorder, leading to weight loss, malnutrition and failure to thrive .   For some reason, during that admission, there was a prolonged delay for over a year in placement in memory care until October 2024, and on arriving at that facility, she exhibited agitation behaviors, and so she was denied entry and sent back to the hospital the same day.     She remained in ER holding for 6 weeks until she was transferred upstairs in Nov 2024 and has been on [facility] [address] since.  [name] nonverbal and does not follow commands. She can ambulate with assistance. [name] comfort care.  Interval History:  Laying in bed in fetal position.  Does not interact which is baseline.  No acute needs at this time.   Old records reviewed in assessment of this patient  Antimicrobials: Anti-infectives (From admission, onward)    None         DVT prophylaxis:  None   Code Status:   Code Status: Limited: Do not attempt resuscitation (DNR) -DNR-LIMITED -Do Not Intubate/DNI   Mobility Assessment (Last 72 Hours)     Mobility Assessment     Row Name 09/03/23 0927 09/02/23 2230 09/02/23 0820 09/01/23 2200 09/01/23 0800   Does patient have an order for bedrest or is patient medically  unstable No - Continue assessment Yes- Bedfast (Level 1) - Complete No - Continue assessment No - Continue assessment No - Continue assessment   What is the highest level of mobility based on the progressive mobility assessment? Level 1 (Bedfast) - Unable to balance while sitting on edge of bed Level 1 (Bedfast) - Unable to balance while sitting on edge of bed Level 5 (Walks with assist in room/hall) - Balance while stepping forward/back and can walk in room with assist - Complete Level 5 (Walks with assist in room/hall) - Balance while stepping forward/back and can walk in room with assist - Complete Level 5 (Walks with assist in room/hall) - Balance while stepping forward/back and can walk in room with assist - Complete   Is the above level different from baseline mobility prior to current illness? No - Consider discontinuing PT/OT No - Consider discontinuing PT/OT No - Consider discontinuing PT/OT No - Consider discontinuing PT/OT No - Consider discontinuing PT/OT    Row Name 08/31/23 2125           Does patient have an order for bedrest or is patient medically unstable No - Continue assessment       What is the highest level of mobility based on the progressive mobility assessment? Level 1 (Bedfast) - Unable to balance while sitting on edge of bed       Is the above level different from baseline mobility  prior to current illness? No - Consider discontinuing PT/OT                Barriers to discharge: placement Disposition Plan:  Comfort care Status is: Inpt  Objective: Blood pressure 109/67, pulse 63, temperature 98 F (36.7 C), resp. rate 18, height 5\' 6"  (1.676 m), weight 37.4 kg, SpO2 100%.  Examination:  Physical Exam Constitutional:      Comments: Chronically ill-appearing frail woman laying in bed in no distress  HENT:     Head: Normocephalic and atraumatic.     Mouth/Throat:     Mouth: Mucous membranes are moist.  Eyes:     Extraocular Movements: Extraocular movements  intact.  Cardiovascular:     Rate and Rhythm: Normal rate and regular rhythm.  Pulmonary:     Effort: Pulmonary effort is normal. No respiratory distress.     Breath sounds: Normal breath sounds. No wheezing.  Abdominal:     General: Bowel sounds are normal. There is no distension.     Palpations: Abdomen is soft.     Tenderness: There is no abdominal tenderness.  Musculoskeletal:        General: Normal range of motion.     Cervical back: Normal range of motion and neck supple.  Skin:    General: Skin is warm and dry.  Neurological:     Comments: Nonverbal; does not follow commands      Consultants:  Psychiatry Wound Care Neurology Palliative care  Procedures:  None  Data Reviewed: No results found for this or any previous visit (from the past 24 hours).  I have reviewed pertinent nursing notes, vitals, labs, and images as necessary. I have ordered labwork to follow up on as indicated.  I have reviewed the last notes from staff over past 24 hours. I have discussed patient's care plan and test results with nursing staff, CM/SW, and other staff as appropriate.    LOS: 174 days   Junita Oliva, MD [facility] 09/03/2023, 4:16 PM

## 2023-09-03 NOTE — Plan of Care (Signed)
 Patient alert and nonverbal. LBM today. Enclosure bed in place. Ambulated in hallway. Restless today. Safety precautions maintained.   Problem: Safety: Goal: Non-violent Restraint(s) Outcome: Progressing   Problem: Education: Goal: Knowledge of the prescribed therapeutic regimen will improve Outcome: Not Progressing   Problem: Coping: Goal: Ability to identify and develop effective coping behavior will improve Outcome: Not Progressing

## 2023-09-04 LAB — CBC WITH DIFFERENTIAL/PLATELET
Abs Immature Granulocytes: 0.01 10*3/uL (ref 0.00–0.07)
Basophils Absolute: 0 10*3/uL (ref 0.0–0.1)
Basophils Relative: 1 %
Eosinophils Absolute: 0.1 10*3/uL (ref 0.0–0.5)
Eosinophils Relative: 2 %
HCT: 35.2 % — ABNORMAL LOW (ref 36.0–46.0)
Hemoglobin: 11.6 g/dL — ABNORMAL LOW (ref 12.0–15.0)
Immature Granulocytes: 0 %
Lymphocytes Relative: 42 %
Lymphs Abs: 1.9 10*3/uL (ref 0.7–4.0)
MCH: 29.8 pg (ref 26.0–34.0)
MCHC: 33 g/dL (ref 30.0–36.0)
MCV: 90.5 fL (ref 80.0–100.0)
Monocytes Absolute: 0.4 10*3/uL (ref 0.1–1.0)
Monocytes Relative: 8 %
Neutro Abs: 2.1 10*3/uL (ref 1.7–7.7)
Neutrophils Relative %: 47 %
Platelets: 268 10*3/uL (ref 150–400)
RBC: 3.89 MIL/uL (ref 3.87–5.11)
RDW: 14.4 % (ref 11.5–15.5)
WBC: 4.5 10*3/uL (ref 4.0–10.5)
nRBC: 0 % (ref 0.0–0.2)

## 2023-09-04 LAB — BASIC METABOLIC PANEL WITH GFR
Anion gap: 7 (ref 5–15)
BUN: 21 mg/dL — ABNORMAL HIGH (ref 6–20)
CO2: 25 mmol/L (ref 22–32)
Calcium: 8.5 mg/dL — ABNORMAL LOW (ref 8.9–10.3)
Chloride: 110 mmol/L (ref 98–111)
Creatinine, Ser: 1.02 mg/dL — ABNORMAL HIGH (ref 0.44–1.00)
GFR, Estimated: >60 mL/min (ref >60)
Glucose, Bld: 80 mg/dL (ref 70–99)
Potassium: 3.9 mmol/L (ref 3.5–5.1)
Sodium: 142 mmol/L (ref 135–145)

## 2023-09-04 NOTE — Plan of Care (Signed)
 Patient alert and nonverbal. Enclosure bed in place. Ambulated in room. Safety precautions maintained.   Problem: Safety: Goal: Non-violent Restraint(s) Outcome: Progressing   Problem: Coping: Goal: Ability to identify and develop effective coping behavior will improve Outcome: Not Progressing

## 2023-09-04 NOTE — Progress Notes (Signed)
 Progress Note    Natalie Brown   [id number]  DOB: May 03, 1965  DOA: 01/22/2023     175 PCP: Darol Elizabeth, NP  Initial CC: Altered mentation  Hospital Course: 58 y.o. F with bipolar disorder and early onset dementia who was admitted for failure to thrive .   This is a long and complicated hospitalization spanning 2 years.  For details, see Dr Pamila Boers note summary from 12/17/22, Dr. Mardella Shadow summary from 01/22/23, or Dr. Shearon Denis summary from 07/17/23.   In brief, she was admitted in 2023 for increasingly erratic behavior and cognitive impairment.  She was evaluated by Neurology and Psychiatry, but no treatable or reversible cause of her impairment could be found.  It appears she has an early onset dementia with comorbid bipolar disorder, leading to weight loss, malnutrition and failure to thrive .   For some reason, during that admission, there was a prolonged delay for over a year in placement in memory care until October 2024, and on arriving at that facility, she exhibited agitation behaviors, and so she was denied entry and sent back to the hospital the same day.     She remained in ER holding for 6 weeks until she was transferred upstairs in Nov 2024 and has been on [facility] since.  [name] nonverbal and does not follow commands. She can ambulate with assistance. [name] comfort care.  Interval History:  Laying in bed in fetal position.  Does not interact which is baseline.  No acute needs at this time.   Old records reviewed in assessment of this patient  Antimicrobials: Anti-infectives (From admission, onward)    None         DVT prophylaxis:  None   Code Status:   Code Status: Limited: Do not attempt resuscitation (DNR) -DNR-LIMITED -Do Not Intubate/DNI   Mobility Assessment (Last 72 Hours)     Mobility Assessment     Row Name 09/04/23 0943 09/03/23 2000 09/03/23 0927 09/02/23 2230 09/02/23 0820   Does patient have an order for bedrest or is patient medically  unstable No - Continue assessment No - Continue assessment No - Continue assessment Yes- Bedfast (Level 1) - Complete No - Continue assessment   What is the highest level of mobility based on the progressive mobility assessment? Level 3 (Stands with assist) - Balance while standing  and cannot march in place Level 5 (Walks with assist in room/hall) - Balance while stepping forward/back and can walk in room with assist - Complete Level 1 (Bedfast) - Unable to balance while sitting on edge of bed Level 1 (Bedfast) - Unable to balance while sitting on edge of bed Level 5 (Walks with assist in room/hall) - Balance while stepping forward/back and can walk in room with assist - Complete   Is the above level different from baseline mobility prior to current illness? No - Consider discontinuing PT/OT No - Consider discontinuing PT/OT No - Consider discontinuing PT/OT No - Consider discontinuing PT/OT No - Consider discontinuing PT/OT    Row Name 09/01/23 2200           Does patient have an order for bedrest or is patient medically unstable No - Continue assessment       What is the highest level of mobility based on the progressive mobility assessment? Level 5 (Walks with assist in room/hall) - Balance while stepping forward/back and can walk in room with assist - Complete       Is the above level different from  baseline mobility prior to current illness? No - Consider discontinuing PT/OT                Barriers to discharge: placement Disposition Plan:  Comfort care Status is: Inpt  Objective: Blood pressure 100/70, pulse 70, temperature 97.6 F (36.4 C), temperature source Oral, resp. rate 16, height 5\' 6"  (1.676 m), weight 37.4 kg, SpO2 100%.  Examination:  Physical Exam Constitutional:      Comments: Chronically ill-appearing frail woman laying in bed in no distress  HENT:     Head: Normocephalic and atraumatic.     Mouth/Throat:     Mouth: Mucous membranes are moist.  Eyes:      Extraocular Movements: Extraocular movements intact.  Cardiovascular:     Rate and Rhythm: Normal rate and regular rhythm.  Pulmonary:     Effort: Pulmonary effort is normal. No respiratory distress.     Breath sounds: Normal breath sounds. No wheezing.  Abdominal:     General: Bowel sounds are normal. There is no distension.     Palpations: Abdomen is soft.     Tenderness: There is no abdominal tenderness.  Musculoskeletal:        General: Normal range of motion.     Cervical back: Normal range of motion and neck supple.  Skin:    General: Skin is warm and dry.  Neurological:     Comments: Nonverbal; does not follow commands      Consultants:  Psychiatry Wound Care Neurology Palliative care  Procedures:  None  Data Reviewed: Results for orders placed or performed during the hospital encounter of 01/22/23 (from the past 24 hours)  CBC with Differential/Platelet     Status: Abnormal   Collection Time: 09/04/23  7:50 AM  Result Value Ref Range   WBC 4.5 4.0 - 10.5 K/uL   RBC 3.89 3.87 - 5.11 MIL/uL   Hemoglobin 11.6 (L) 12.0 - 15.0 g/dL   HCT 16.1 (L) 09.6 - 04.5 %   MCV 90.5 80.0 - 100.0 fL   MCH 29.8 26.0 - 34.0 pg   MCHC 33.0 30.0 - 36.0 g/dL   RDW 40.9 81.1 - 91.4 %   Platelets 268 150 - 400 K/uL   nRBC 0.0 0.0 - 0.2 %   Neutrophils Relative % 47 %   Neutro Abs 2.1 1.7 - 7.7 K/uL   Lymphocytes Relative 42 %   Lymphs Abs 1.9 0.7 - 4.0 K/uL   Monocytes Relative 8 %   Monocytes Absolute 0.4 0.1 - 1.0 K/uL   Eosinophils Relative 2 %   Eosinophils Absolute 0.1 0.0 - 0.5 K/uL   Basophils Relative 1 %   Basophils Absolute 0.0 0.0 - 0.1 K/uL   Immature Granulocytes 0 %   Abs Immature Granulocytes 0.01 0.00 - 0.07 K/uL  Basic metabolic panel     Status: Abnormal   Collection Time: 09/04/23  7:50 AM  Result Value Ref Range   Sodium 142 135 - 145 mmol/L   Potassium 3.9 3.5 - 5.1 mmol/L   Chloride 110 98 - 111 mmol/L   CO2 25 22 - 32 mmol/L   Glucose, Bld 80 70 -  99 mg/dL   BUN 21 (H) 6 - 20 mg/dL   Creatinine, Ser 7.82 (H) 0.44 - 1.00 mg/dL   Calcium  8.5 (L) 8.9 - 10.3 mg/dL   GFR, Estimated >95 >62 mL/min   Anion gap 7 5 - 15    I have reviewed pertinent nursing notes, vitals, labs, and  images as necessary. I have ordered labwork to follow up on as indicated.  I have reviewed the last notes from staff over past 24 hours. I have discussed patient's care plan and test results with nursing staff, CM/SW, and other staff as appropriate.    LOS: 175 days   Junita Oliva, MD [facility] 09/04/2023, 3:44 PM

## 2023-09-05 NOTE — Progress Notes (Signed)
 Progress Note    Natalie Brown   [id number]  DOB: November 21, 1965  DOA: 01/22/2023     176 PCP: Darol Elizabeth, NP  Initial CC: Altered mentation  Hospital Course: 58 y.o. F with bipolar disorder and early onset dementia who was admitted for failure to thrive .   This is a long and complicated hospitalization spanning 2 years.  For details, see Dr Pamila Boers note summary from 12/17/22, Dr. Mardella Shadow summary from 01/22/23, or Dr. Shearon Denis summary from 07/17/23.   In brief, she was admitted in 2023 for increasingly erratic behavior and cognitive impairment.  She was evaluated by Neurology and Psychiatry, but no treatable or reversible cause of her impairment could be found.  It appears she has an early onset dementia with comorbid bipolar disorder, leading to weight loss, malnutrition and failure to thrive .   For some reason, during that admission, there was a prolonged delay for over a year in placement in memory care until October 2024, and on arriving at that facility, she exhibited agitation behaviors, and so she was denied entry and sent back to the hospital the same day.     She remained in ER holding for 6 weeks until she was transferred upstairs in Nov 2024 and has been on [facility] since.  [name] nonverbal and does not follow commands. She can ambulate with assistance. [name] comfort care.  Interval History:  Laying in bed in fetal position.  Does not interact which is baseline.  No acute needs at this time.   Old records reviewed in assessment of this patient  Antimicrobials: Anti-infectives (From admission, onward)    None         DVT prophylaxis:  None   Code Status:   Code Status: Limited: Do not attempt resuscitation (DNR) -DNR-LIMITED -Do Not Intubate/DNI   Mobility Assessment (Last 72 Hours)     Mobility Assessment     Row Name 09/05/23 1000 09/04/23 2100 09/04/23 0943 09/03/23 2000 09/03/23 0927   Does patient have an order for bedrest or is patient medically  unstable No - Continue assessment No - Continue assessment No - Continue assessment No - Continue assessment No - Continue assessment   What is the highest level of mobility based on the progressive mobility assessment? Level 5 (Walks with assist in room/hall) - Balance while stepping forward/back and can walk in room with assist - Complete Level 5 (Walks with assist in room/hall) - Balance while stepping forward/back and can walk in room with assist - Complete Level 3 (Stands with assist) - Balance while standing  and cannot march in place Level 5 (Walks with assist in room/hall) - Balance while stepping forward/back and can walk in room with assist - Complete Level 1 (Bedfast) - Unable to balance while sitting on edge of bed   Is the above level different from baseline mobility prior to current illness? No - Consider discontinuing PT/OT No - Consider discontinuing PT/OT No - Consider discontinuing PT/OT No - Consider discontinuing PT/OT No - Consider discontinuing PT/OT    Row Name 09/02/23 2230           Does patient have an order for bedrest or is patient medically unstable Yes- Bedfast (Level 1) - Complete       What is the highest level of mobility based on the progressive mobility assessment? Level 1 (Bedfast) - Unable to balance while sitting on edge of bed       Is the above level different from  baseline mobility prior to current illness? No - Consider discontinuing PT/OT                Barriers to discharge: placement Disposition Plan:  Comfort care Status is: Inpt  Objective: Blood pressure 102/61, pulse 61, temperature (!) 97.5 F (36.4 C), temperature source Oral, resp. rate 18, height 5\' 6"  (1.676 m), weight 37.4 kg, SpO2 100%.  Examination:  Physical Exam Constitutional:      Comments: Chronically ill-appearing frail woman laying in bed in no distress  HENT:     Head: Normocephalic and atraumatic.     Mouth/Throat:     Mouth: Mucous membranes are moist.  Eyes:      Extraocular Movements: Extraocular movements intact.  Cardiovascular:     Rate and Rhythm: Normal rate and regular rhythm.  Pulmonary:     Effort: Pulmonary effort is normal. No respiratory distress.     Breath sounds: Normal breath sounds. No wheezing.  Abdominal:     General: Bowel sounds are normal. There is no distension.     Palpations: Abdomen is soft.     Tenderness: There is no abdominal tenderness.  Musculoskeletal:        General: Normal range of motion.     Cervical back: Normal range of motion and neck supple.  Skin:    General: Skin is warm and dry.  Neurological:     Comments: Nonverbal; does not follow commands      Consultants:  Psychiatry Wound Care Neurology Palliative care  Procedures:  None  Data Reviewed: No results found for this or any previous visit (from the past 24 hours).   I have reviewed pertinent nursing notes, vitals, labs, and images as necessary. I have ordered labwork to follow up on as indicated.  I have reviewed the last notes from staff over past 24 hours. I have discussed patient's care plan and test results with nursing staff, CM/SW, and other staff as appropriate.    LOS: 176 days   Junita Oliva, MD [facility] 09/05/2023, 5:04 PM

## 2023-09-05 NOTE — Plan of Care (Signed)
 Patient alert and nonverbal. Restless. Enclosure bed in place. Safety precautions maintained.    Problem: Safety: Goal: Non-violent Restraint(s) Outcome: Progressing   Problem: Pain Management: Goal: Satisfaction with pain management regimen will improve Outcome: Progressing   Problem: Coping: Goal: Ability to identify and develop effective coping behavior will improve Outcome: Not Progressing

## 2023-09-06 NOTE — Progress Notes (Signed)
 Progress Note    Natalie Brown   [id number]  DOB: [birth year]  DOA: 01/22/2023     177 PCP: Darol Elizabeth, NP  Initial CC: Altered mentation  Hospital Course: 58 y.o. F with bipolar disorder and early onset dementia who was admitted for failure to thrive .   This is a long and complicated hospitalization spanning 2 years.  For details, see Dr Pamila Boers note summary from 12/17/22, Dr. Mardella Shadow summary from 01/22/23, or Dr. Shearon Denis summary from 07/17/23.   In brief, she was admitted in 2023 for increasingly erratic behavior and cognitive impairment.  She was evaluated by Neurology and Psychiatry, but no treatable or reversible cause of her impairment could be found.  It appears she has an early onset dementia with comorbid bipolar disorder, leading to weight loss, malnutrition and failure to thrive .   For some reason, during that admission, there was a prolonged delay for over a year in placement in memory care until October 2024, and on arriving at that facility, she exhibited agitation behaviors, and so she was denied entry and sent back to the hospital the same day.     She remained in ER holding for 6 weeks until she was transferred upstairs in Nov 2024 and has been on [facility] since.  [name] nonverbal and does not follow commands. She can ambulate with assistance. [name] comfort care.  Interval History:  Laying in bed in fetal position, but also moves about in the bed using all four extremitiese. Does not interact which is baseline.  No acute needs at this time.   Old records reviewed in assessment of this patient.  [name] medically cleared for placement.  Antimicrobials: Anti-infectives (From admission, onward)    None      DVT prophylaxis:  None   Code Status:   Code Status: Limited: Do not attempt resuscitation (DNR) -DNR-LIMITED -Do Not Intubate/DNI   Mobility Assessment (Last 72 Hours)     Mobility Assessment     Row Name 09/06/23 0900 09/05/23 2111 09/05/23  1000 09/04/23 2100 09/04/23 0943   Does patient have an order for bedrest or is patient medically unstable No - Continue assessment No - Continue assessment No - Continue assessment No - Continue assessment No - Continue assessment   What is the highest level of mobility based on the progressive mobility assessment? Level 5 (Walks with assist in room/hall) - Balance while stepping forward/back and can walk in room with assist - Complete Level 5 (Walks with assist in room/hall) - Balance while stepping forward/back and can walk in room with assist - Complete Level 5 (Walks with assist in room/hall) - Balance while stepping forward/back and can walk in room with assist - Complete Level 5 (Walks with assist in room/hall) - Balance while stepping forward/back and can walk in room with assist - Complete Level 3 (Stands with assist) - Balance while standing  and cannot march in place   Is the above level different from baseline mobility prior to current illness? -- No - Consider discontinuing PT/OT No - Consider discontinuing PT/OT No - Consider discontinuing PT/OT No - Consider discontinuing PT/OT    Row Name 09/03/23 2000           Does patient have an order for bedrest or is patient medically unstable No - Continue assessment       What is the highest level of mobility based on the progressive mobility assessment? Level 5 (Walks with assist in room/hall) - Balance  while stepping forward/back and can walk in room with assist - Complete       Is the above level different from baseline mobility prior to current illness? No - Consider discontinuing PT/OT                Barriers to discharge: placement Disposition Plan:  Comfort care Status is: Inpt  Objective: Blood pressure (!) 92/41, pulse 89, temperature 98.7 F (37.1 C), temperature source Oral, resp. rate 18, height 5\' 6"  (1.676 m), weight 37.4 kg, SpO2 100%.  Examination:  Physical Exam Constitutional:      Comments: Chronically  ill-appearing frail woman laying in bed in no distress  HENT:     Head: Normocephalic and atraumatic.     Mouth/Throat:     Mouth: Mucous membranes are moist.  Eyes:     Extraocular Movements: Extraocular movements intact.  Cardiovascular:     Rate and Rhythm: Normal rate and regular rhythm.  Pulmonary:     Effort: Pulmonary effort is normal. No respiratory distress.     Breath sounds: Normal breath sounds. No wheezing.  Abdominal:     General: Bowel sounds are normal. There is no distension.     Palpations: Abdomen is soft.     Tenderness: There is no abdominal tenderness.  Musculoskeletal:        General: Normal range of motion.     Cervical back: Normal range of motion and neck supple.  Skin:    General: Skin is warm and dry.  Neurological:     Comments: Nonverbal; does not follow commands      Consultants:  Psychiatry Wound Care Neurology Palliative care  Procedures:  None  Data Reviewed: No results found for this or any previous visit (from the past 24 hours).   I have reviewed pertinent nursing notes, vitals, labs, and images as necessary. I have ordered labwork to follow up on as indicated.  I have reviewed the last notes from staff over past 24 hours. I have discussed patient's care plan and test results with nursing staff, CM/SW, and other staff as appropriate.    LOS: 177 days   Junita Oliva, MD [facility] 09/06/2023, 2:08 PM

## 2023-09-06 NOTE — Plan of Care (Signed)
  Problem: Safety: Goal: Non-violent Restraint(s) Outcome: Progressing   Problem: Education: Goal: Knowledge of the prescribed therapeutic regimen will improve Outcome: Progressing   Problem: Coping: Goal: Ability to identify and develop effective coping behavior will improve Outcome: Progressing   Problem: Clinical Measurements: Goal: Quality of life will improve Outcome: Progressing   Problem: Respiratory: Goal: Verbalizations of increased ease of respirations will increase Outcome: Progressing   Problem: Role Relationship: Goal: Family's ability to cope with current situation will improve Outcome: Progressing Goal: Ability to verbalize concerns, feelings, and thoughts to partner or family member will improve Outcome: Progressing   Problem: Pain Management: Goal: Satisfaction with pain management regimen will improve Outcome: Progressing

## 2023-09-07 NOTE — Plan of Care (Signed)
  Problem: Safety: Goal: Non-violent Restraint(s) Outcome: Progressing   Problem: Clinical Measurements: Goal: Quality of life will improve Outcome: Progressing   Problem: Pain Management: Goal: Satisfaction with pain management regimen will improve Outcome: Progressing

## 2023-09-07 NOTE — Hospital Course (Addendum)
 58 y.o. F with bipolar disorder and early onset dementia who was admitted for failure to thrive .   This is a long and complicated hospitalization spanning 2 years.  For details, see Dr Pamila Boers note summary from 12/17/22, Dr. Mardella Shadow summary from 01/22/23, or Dr. Shearon Denis summary from 07/17/23.   In brief, she was admitted in 2023 for increasingly erratic behavior and cognitive impairment.  She was evaluated by Neurology and Psychiatry, but no treatable or reversible cause of her impairment could be found.  It appears she has an early onset dementia with comorbid bipolar disorder, leading to weight loss, malnutrition and failure to thrive .   For some reason, during that admission, there was a prolonged delay for over a year in placement in memory care until October 2024, and on arriving at that facility, she exhibited agitation behaviors, and so she was denied entry and sent back to the hospital the same day.     She remained in ER holding for 6 weeks until she was transferred upstairs in Nov 2024 and has been on [facility] since.  [name] nonverbal and does not follow commands. She can ambulate with assistance. [name] comfort care.   Assessment and Plan:   Early onset Dementia with behavioral disturbance Bipolar disorder Severe protein calorie malnutrition  Chronic kidney disease stage IIIa  Hypernatremia   Comfort care.   - On Ativan  0.5 mg TID, and ensure supplementations.  Continue assistance with meals.  Restraint order renewed.  Working closely with TOC on disposition planning.

## 2023-09-07 NOTE — Progress Notes (Signed)
 Progress Note   Patient: Natalie Brown WGN:[id number] DOB: [birth year] DOA: 01/22/2023     178 DOS: the patient was seen and examined on 09/07/2023   Brief hospital course:  58 y.o. F with bipolar disorder and early onset dementia who was admitted for failure to thrive .   This is a long and complicated hospitalization spanning 2 years.  For details, see Dr Pamila Boers note summary from 12/17/22, Dr. Mardella Shadow summary from 01/22/23, or Dr. Shearon Denis summary from 07/17/23.   In brief, she was admitted in 2023 for increasingly erratic behavior and cognitive impairment.  She was evaluated by Neurology and Psychiatry, but no treatable or reversible cause of her impairment could be found.  It appears she has an early onset dementia with comorbid bipolar disorder, leading to weight loss, malnutrition and failure to thrive .   For some reason, during that admission, there was a prolonged delay for over a year in placement in memory care until October 2024, and on arriving at that facility, she exhibited agitation behaviors, and so she was denied entry and sent back to the hospital the same day.     She remained in ER holding for 6 weeks until she was transferred upstairs in Nov 2024 and has been on [facility] since.  [name] nonverbal and does not follow commands. She can ambulate with assistance. [name] comfort care.  Assessment and Plan:  Early onset Dementia with behavioral disturbance Bipolar disorder Severe protein calorie malnutrition  Chronic kidney disease stage IIIa   Comfort care.   - On Ativan  0.5 mg TID, and ensure supplementations.  Continue assistance with meals   Subjective: Laying in bed in fetal position. Does not interact which is baseline. No acute needs at this time.    Old records reviewed in assessment of this patient  Physical Exam: Vitals:   09/04/23 0400 09/04/23 2038 09/05/23 1932 09/06/23 0905  BP: 100/70 102/61 101/66 (!) 92/41  Pulse: 70 61 81 89  Resp: 16 18 18    Temp: 97.6  F (36.4 C) (!) 97.5 F (36.4 C) 98.7 F (37.1 C)   TempSrc: Oral Oral Oral   SpO2: 100% 100% 100%   Weight:      Height:        GENERAL: Frail, lying in fetal position, no distress HEENT: Normocephalic, atraumatic CARDIOVASCULAR:  RRR RESPIRATORY: Normal respiratory effort GASTROINTESTINAL: Nondistended EXTREMITIES: Thin, no LE edema bilaterally NEURO: Nonverbal, does not follow commands SKIN:  No rashes noted PSYCH: Nonverbal    Data Reviewed:  No new imaging to review  Labs: CBC: Recent Labs  Lab 09/04/23 0750  WBC 4.5  NEUTROABS 2.1  HGB 11.6*  HCT 35.2*  MCV 90.5  PLT 268   Basic Metabolic Panel: Recent Labs  Lab 09/04/23 0750  NA 142  K 3.9  CL 110  CO2 25  GLUCOSE 80  BUN 21*  CREATININE 1.02*  CALCIUM  8.5*   Liver Function Tests: No results for input(s): "AST", "ALT", "ALKPHOS", "BILITOT", "PROT", "ALBUMIN " in the last 168 hours. CBG: No results for input(s): "GLUCAP" in the last 168 hours.  Scheduled Meds:  feeding supplement  237 mL Oral BID BM   LORazepam   0.5 mg Oral TID   Continuous Infusions: PRN Meds:.acetaminophen  **OR** acetaminophen , guaiFENesin , lactulose , LORazepam , senna-docusate, traZODone   Family Communication: None at bedside  Disposition: Status is: Inpatient Remains inpatient appropriate because: Comfort care     Time spent: 31 minutes  Author: Jodeane Mulligan, DO 09/07/2023 1:08 PM  For  on call review [email or web address].

## 2023-09-08 NOTE — Progress Notes (Signed)
 Progress Note   Patient: Natalie Brown:[id number] DOB: 12/27/1965 DOA: 01/22/2023     179 DOS: the patient was seen and examined on 09/08/2023   Brief hospital course:  58 y.o. F with bipolar disorder and early onset dementia who was admitted for failure to thrive .   This is a long and complicated hospitalization spanning 2 years.  For details, see Dr Pamila Boers note summary from 12/17/22, Dr. Mardella Shadow summary from 01/22/23, or Dr. Shearon Denis summary from 07/17/23.   In brief, she was admitted in 2023 for increasingly erratic behavior and cognitive impairment.  She was evaluated by Neurology and Psychiatry, but no treatable or reversible cause of her impairment could be found.  It appears she has an early onset dementia with comorbid bipolar disorder, leading to weight loss, malnutrition and failure to thrive .   For some reason, during that admission, there was a prolonged delay for over a year in placement in memory care until October 2024, and on arriving at that facility, she exhibited agitation behaviors, and so she was denied entry and sent back to the hospital the same day.     She remained in ER holding for 6 weeks until she was transferred upstairs in Nov 2024 and has been on [facility] since.  [name] nonverbal and does not follow commands. She can ambulate with assistance. [name] comfort care.  Assessment and Plan:  Early onset Dementia with behavioral disturbance Bipolar disorder Severe protein calorie malnutrition  Chronic kidney disease stage IIIa   Comfort care.   - On Ativan  0.5 mg TID, and ensure supplementations.  Continue assistance with meals   Subjective: Laying in bed in fetal position. Does not interact which is baseline. No acute needs at this time.    Old records reviewed in assessment of this patient  Physical Exam: Vitals:   09/04/23 0400 09/04/23 2038 09/05/23 1932 09/06/23 0905  BP: 100/70 102/61 101/66 (!) 92/41  Pulse: 70 61 81 89  Resp: 16 18 18    Temp: 97.6  F (36.4 C) (!) 97.5 F (36.4 C) 98.7 F (37.1 C)   TempSrc: Oral Oral Oral   SpO2: 100% 100% 100%   Weight:      Height:        GENERAL: Frail, lying in fetal position, no distress HEENT: Normocephalic, atraumatic CARDIOVASCULAR:  RRR RESPIRATORY: Normal respiratory effort GASTROINTESTINAL: Nondistended EXTREMITIES: Thin, no LE edema bilaterally NEURO: Nonverbal, does not follow commands SKIN:  No rashes noted PSYCH: Nonverbal    Data Reviewed:  No new imaging to review  Labs: CBC: Recent Labs  Lab 09/04/23 0750  WBC 4.5  NEUTROABS 2.1  HGB 11.6*  HCT 35.2*  MCV 90.5  PLT 268   Basic Metabolic Panel: Recent Labs  Lab 09/04/23 0750  NA 142  K 3.9  CL 110  CO2 25  GLUCOSE 80  BUN 21*  CREATININE 1.02*  CALCIUM  8.5*   Liver Function Tests: No results for input(s): "AST", "ALT", "ALKPHOS", "BILITOT", "PROT", "ALBUMIN " in the last 168 hours. CBG: No results for input(s): "GLUCAP" in the last 168 hours.  Scheduled Meds:  feeding supplement  237 mL Oral BID BM   LORazepam   0.5 mg Oral TID   Continuous Infusions: PRN Meds:.acetaminophen  **OR** acetaminophen , guaiFENesin , lactulose , LORazepam , senna-docusate, traZODone   Family Communication: None at bedside  Disposition: Status is: Inpatient Remains inpatient appropriate because: Comfort care     Time spent: 30 minutes  Author: Jodeane Mulligan, DO 09/08/2023 12:44 PM  For  on call review [email or web address].

## 2023-09-08 NOTE — Plan of Care (Signed)
  Problem: Safety: Goal: Non-violent Restraint(s) Outcome: Progressing   Problem: Education: Goal: Knowledge of the prescribed therapeutic regimen will improve Outcome: Progressing   Problem: Coping: Goal: Ability to identify and develop effective coping behavior will improve Outcome: Progressing   Problem: Clinical Measurements: Goal: Quality of life will improve Outcome: Progressing   Problem: Respiratory: Goal: Verbalizations of increased ease of respirations will increase Outcome: Progressing   Problem: Role Relationship: Goal: Family's ability to cope with current situation will improve Outcome: Progressing Goal: Ability to verbalize concerns, feelings, and thoughts to partner or family member will improve Outcome: Progressing   Problem: Pain Management: Goal: Satisfaction with pain management regimen will improve Outcome: Progressing

## 2023-09-09 NOTE — Plan of Care (Signed)
 Patient alert and nonverbal. Too restless to get vitals. Enclosure bed in place. Safety precautions in place.    Problem: Pain Management: Goal: Satisfaction with pain management regimen will improve Outcome: Progressing   Problem: Safety: Goal: Non-violent Restraint(s) Outcome: Not Progressing   Problem: Education: Goal: Knowledge of the prescribed therapeutic regimen will improve Outcome: Not Progressing   Problem: Coping: Goal: Ability to identify and develop effective coping behavior will improve Outcome: Not Progressing

## 2023-09-09 NOTE — Progress Notes (Signed)
 Progress Note   Patient: Natalie Brown:[id number] DOB: 02-06-1966 DOA: 01/22/2023     180 DOS: the patient was seen and examined on 09/09/2023   Brief hospital course:  58 y.o. F with bipolar disorder and early onset dementia who was admitted for failure to thrive .   This is a long and complicated hospitalization spanning 2 years.  For details, see Dr Pamila Boers note summary from 12/17/22, Dr. Mardella Shadow summary from 01/22/23, or Dr. Shearon Denis summary from 07/17/23.   In brief, she was admitted in 2023 for increasingly erratic behavior and cognitive impairment.  She was evaluated by Neurology and Psychiatry, but no treatable or reversible cause of her impairment could be found.  It appears she has an early onset dementia with comorbid bipolar disorder, leading to weight loss, malnutrition and failure to thrive .   For some reason, during that admission, there was a prolonged delay for over a year in placement in memory care until October 2024, and on arriving at that facility, she exhibited agitation behaviors, and so she was denied entry and sent back to the hospital the same day.     She remained in ER holding for 6 weeks until she was transferred upstairs in Nov 2024 and has been on [facility] since.  [name] nonverbal and does not follow commands. She can ambulate with assistance. [name] comfort care.   Assessment and Plan:   Early onset Dementia with behavioral disturbance Bipolar disorder Severe protein calorie malnutrition  Chronic kidney disease stage IIIa    Comfort care.   - On Ativan  0.5 mg TID, and ensure supplementations.  Continue assistance with meals    Subjective: Patient lying in enclosure bed.  Does not interact at baseline.  No acute needs at this time.  Does not appear to be in distress.  Vital stable.  Physical Exam: Vitals:   09/05/23 1932 09/06/23 0905 09/08/23 1647 09/08/23 2011  BP: 101/66 (!) 92/41 (!) 93/54 114/62  Pulse: 81 89  (!) 110  Resp: 18   18  Temp: 98.7  F (37.1 C)   99.6 F (37.6 C)  TempSrc: Oral   Oral  SpO2: 100%  99% 100%  Weight:      Height:        GENERAL: Frail, lying in fetal position, no distress HEENT: Normocephalic, atraumatic CARDIOVASCULAR:  RRR RESPIRATORY: Normal respiratory effort GASTROINTESTINAL: Nondistended EXTREMITIES: Thin, no LE edema bilaterally NEURO: Nonverbal, appears catatonic, does not follow commands SKIN:  No rashes noted PSYCH: Nonverbal   Data Reviewed:  No new imaging to review  Previous records (including but not limited to H&P, progress notes, nursing notes, TOC management) were reviewed in assessment of this patient.  Labs: CBC: Recent Labs  Lab 09/04/23 0750  WBC 4.5  NEUTROABS 2.1  HGB 11.6*  HCT 35.2*  MCV 90.5  PLT 268   Basic Metabolic Panel: Recent Labs  Lab 09/04/23 0750  NA 142  K 3.9  CL 110  CO2 25  GLUCOSE 80  BUN 21*  CREATININE 1.02*  CALCIUM  8.5*   Liver Function Tests: No results for input(s): "AST", "ALT", "ALKPHOS", "BILITOT", "PROT", "ALBUMIN " in the last 168 hours. CBG: No results for input(s): "GLUCAP" in the last 168 hours.  Scheduled Meds:  feeding supplement  237 mL Oral BID BM   LORazepam   0.5 mg Oral TID   Continuous Infusions: PRN Meds:.acetaminophen  **OR** acetaminophen , guaiFENesin , lactulose , LORazepam , senna-docusate, traZODone   Family Communication: None at bedside  Disposition: Status is: Inpatient  Remains inpatient appropriate because: Disposition comfort care     Time spent: 29 minutes  Length of stay: 180 days  Author: Jodeane Mulligan, DO 09/09/2023 12:32 PM  For on call review [email or web address].

## 2023-09-09 NOTE — Plan of Care (Signed)
  Problem: Safety: Goal: Non-violent Restraint(s) Outcome: Progressing   Problem: Education: Goal: Knowledge of the prescribed therapeutic regimen will improve Outcome: Progressing   Problem: Coping: Goal: Ability to identify and develop effective coping behavior will improve Outcome: Progressing   Problem: Clinical Measurements: Goal: Quality of life will improve Outcome: Progressing   Problem: Respiratory: Goal: Verbalizations of increased ease of respirations will increase Outcome: Progressing   Problem: Role Relationship: Goal: Family's ability to cope with current situation will improve Outcome: Progressing Goal: Ability to verbalize concerns, feelings, and thoughts to partner or family member will improve Outcome: Progressing   Problem: Pain Management: Goal: Satisfaction with pain management regimen will improve Outcome: Progressing

## 2023-09-10 NOTE — Plan of Care (Signed)
  Problem: Safety: Goal: Non-violent Restraint(s) Outcome: Progressing   Problem: Education: Goal: Knowledge of the prescribed therapeutic regimen will improve Outcome: Progressing   Problem: Coping: Goal: Ability to identify and develop effective coping behavior will improve Outcome: Progressing   Problem: Clinical Measurements: Goal: Quality of life will improve Outcome: Progressing   Problem: Respiratory: Goal: Verbalizations of increased ease of respirations will increase Outcome: Progressing   Problem: Role Relationship: Goal: Family's ability to cope with current situation will improve Outcome: Progressing Goal: Ability to verbalize concerns, feelings, and thoughts to partner or family member will improve Outcome: Progressing

## 2023-09-10 NOTE — TOC Progression Note (Signed)
 Transition of Care [facility]) - Progression Note    Patient Details  Name: Natalie Brown MRN: [id number] Date of Birth: Jun 06, 1965  [facility] CM/SW Contact  Dane Dung, RN Phone Number: 09/10/2023, 2:23 PM  Clinical Narrative:    Patient remains inpatient and difficult to placement.  Patient has no bed offers and is unable to return home with family since the patient's mother lives in government housing.  Patient remains in enclosure bed for safety.  Hospital leadership is aware of patient's barriers to discharge and not bed offers available at this time.        Expected Discharge Plan and Services                                               Social Determinants of Health (SDOH) Interventions SDOH Screenings   Food Insecurity: No Food Insecurity (05/14/2023)  Housing: Low Risk  (01/24/2023)  Transportation Needs: No Transportation Needs (05/14/2023)  Utilities: Not At Risk (05/14/2023)  Alcohol Screen: Low Risk  (09/06/2021)  Depression (PHQ2-9): Medium Risk (05/26/2020)  Tobacco Use: High Risk (04/04/2023)    Readmission Risk Interventions     No data to display

## 2023-09-10 NOTE — Progress Notes (Signed)
 PROGRESS NOTE    Natalie Brown  WUJ:[id number] DOB: 04/28/1965 DOA: 01/22/2023 PCP: Darol Elizabeth, NP   Brief Narrative: 59 y.o. female history significant for early onset dementia, bipolar disorder, catatonia essential hypertension sinus tachycardia B12 deficiency severe protein caloric malnutrition he was in the hospital for more than 400 days discharged on 01/22/2023 only to return back to the ED the same day due to agitation. While in the ED evaluated by psychiatry who made changes to his medication. Patient is currently a DNR/DNI patient was admitted to try to improve patient environment, circadian rhythm and provide a window which can help improve the patient psychiatric status. Palliative care is also on board. Difficulty with placement. Patient has been made comfort care.    Assessment and Plan:  Early onset dementia Noted.  Palliative care consulted patient has been transitioned to comfort measures. -Continue Ativan   Bipolar disorder No longer on antipsychotic medication management. Comfort measures.  Hypernatremia Secondary to poor oral intake. Now comfort measures.  Severe protein calorie malnutrition Significant weight loss secondary to poor oral intake. No feeding tube per goals of care. Patient is comfort measures. Continuing to encourage oral/nutrition intake.  CKD stage IIIa Stable.  Primary hypertension No longer on medical therapy. Comfort measures.  Sinus tachycardia Noted. Likely related to prior dehydration. Resolved.   DVT prophylaxis: Lovenox  Code Status:   Code Status: Limited: Do not attempt resuscitation (DNR) -DNR-LIMITED -Do Not Intubate/DNI  Family Communication: None at bedside. Disposition Plan: Discharge pending ongoing TOC   Consultants:  Psychiatry  Procedures:    Antimicrobials:    Subjective: Patient is non-verbal. No issues noted from overnight events  Objective: BP 114/62 (BP Location: Left Leg)   Pulse (!) 110    Temp 99.6 F (37.6 C) (Oral)   Resp 18   Ht 5\' 6"  (1.676 m)   Wt 37.4 kg   SpO2 100%   BMI 13.32 kg/m   Examination:  General exam: Appears restless Respiratory system: Respiratory effort normal. Central nervous system: Alert. Musculoskeletal: No edema. No calf tenderness  Data Reviewed: I have personally reviewed following labs and imaging studies  CBC Lab Results  Component Value Date   WBC 4.5 09/04/2023   RBC 3.89 09/04/2023   HGB 11.6 (L) 09/04/2023   HCT 35.2 (L) 09/04/2023   MCV 90.5 09/04/2023   MCH 29.8 09/04/2023   PLT 268 09/04/2023   MCHC 33.0 09/04/2023   RDW 14.4 09/04/2023   LYMPHSABS 1.9 09/04/2023   MONOABS 0.4 09/04/2023   EOSABS 0.1 09/04/2023   BASOSABS 0.0 09/04/2023     Last metabolic panel Lab Results  Component Value Date   NA 142 09/04/2023   K 3.9 09/04/2023   CL 110 09/04/2023   CO2 25 09/04/2023   BUN 21 (H) 09/04/2023   CREATININE 1.02 (H) 09/04/2023   GLUCOSE 80 09/04/2023   GFRNONAA >60 09/04/2023   GFRAA 65 09/19/2017   CALCIUM  8.5 (L) 09/04/2023   PHOS 3.0 03/26/2023   PROT 7.2 08/03/2023   ALBUMIN  3.3 (L) 08/03/2023   LABGLOB 2.8 06/21/2020   AGRATIO 1.5 06/21/2020   BILITOT 0.6 08/03/2023   ALKPHOS 40 08/03/2023   AST 18 08/03/2023   ALT 15 08/03/2023   ANIONGAP 7 09/04/2023    GFR: Estimated Creatinine Clearance: 36.4 mL/min (A) (by C-G formula based on SCr of 1.02 mg/dL (H)).  No results found for this or any previous visit (from the past 240 hours).    Radiology Studies: No results  found.    LOS: 181 days    Aneita Keens, MD [facility] 09/10/2023, 8:20 AM   If 7PM-7AM, please contact night-coverage [email or web address]

## 2023-09-11 NOTE — Progress Notes (Signed)
 PROGRESS NOTE    Natalie Brown  ZHY:[id number] DOB: [birth year] DOA: 01/22/2023 PCP: Darol Elizabeth, NP   Brief Narrative: 58 y.o. female history significant for early onset dementia, bipolar disorder, catatonia essential hypertension sinus tachycardia B12 deficiency severe protein caloric malnutrition he was in the hospital for more than 400 days discharged on 01/22/2023 only to return back to the ED the same day due to agitation. While in the ED evaluated by psychiatry who made changes to his medication. Patient is currently a DNR/DNI patient was admitted to try to improve patient environment, circadian rhythm and provide a window which can help improve the patient psychiatric status. Palliative care is also on board. Difficulty with placement. Patient has been made comfort care.    Assessment and Plan:  Early onset dementia Noted.  Palliative care consulted patient has been transitioned to comfort measures. -Continue Ativan   Bipolar disorder No longer on antipsychotic medication management. Comfort measures.  Hypernatremia Secondary to poor oral intake. Now comfort measures.  Severe protein calorie malnutrition Significant weight loss secondary to poor oral intake. No feeding tube per goals of care. Patient is comfort measures. Continuing to encourage oral/nutrition intake.  CKD stage IIIa Stable.  Primary hypertension No longer on medical therapy. Comfort measures.  Sinus tachycardia Noted. Likely related to prior dehydration. Resolved.   DVT prophylaxis: Lovenox  Code Status:   Code Status: Limited: Do not attempt resuscitation (DNR) -DNR-LIMITED -Do Not Intubate/DNI  Family Communication: None at bedside. Disposition Plan: Discharge pending ongoing TOC   Consultants:  Psychiatry  Procedures:    Antimicrobials:    Subjective: Non-verbal. Lying in bed.  Objective: BP 114/62 (BP Location: Left Leg)   Pulse (!) 110   Temp 99.6 F (37.6 C) (Oral)    Resp 18   Ht 5\' 6"  (1.676 m)   Wt 37.4 kg   SpO2 100%   BMI 13.32 kg/m   Examination:  General exam: Appears calm and comfortable. Sleeping in bed. Respiratory system: Respiratory effort normal.  Data Reviewed: I have personally reviewed following labs and imaging studies  CBC Lab Results  Component Value Date   WBC 4.5 09/04/2023   RBC 3.89 09/04/2023   HGB 11.6 (L) 09/04/2023   HCT 35.2 (L) 09/04/2023   MCV 90.5 09/04/2023   MCH 29.8 09/04/2023   PLT 268 09/04/2023   MCHC 33.0 09/04/2023   RDW 14.4 09/04/2023   LYMPHSABS 1.9 09/04/2023   MONOABS 0.4 09/04/2023   EOSABS 0.1 09/04/2023   BASOSABS 0.0 09/04/2023     Last metabolic panel Lab Results  Component Value Date   NA 142 09/04/2023   K 3.9 09/04/2023   CL 110 09/04/2023   CO2 25 09/04/2023   BUN 21 (H) 09/04/2023   CREATININE 1.02 (H) 09/04/2023   GLUCOSE 80 09/04/2023   GFRNONAA >60 09/04/2023   GFRAA 65 09/19/2017   CALCIUM  8.5 (L) 09/04/2023   PHOS 3.0 03/26/2023   PROT 7.2 08/03/2023   ALBUMIN  3.3 (L) 08/03/2023   LABGLOB 2.8 06/21/2020   AGRATIO 1.5 06/21/2020   BILITOT 0.6 08/03/2023   ALKPHOS 40 08/03/2023   AST 18 08/03/2023   ALT 15 08/03/2023   ANIONGAP 7 09/04/2023    GFR: Estimated Creatinine Clearance: 36.4 mL/min (A) (by C-G formula based on SCr of 1.02 mg/dL (H)).  No results found for this or any previous visit (from the past 240 hours).    Radiology Studies: No results found.    LOS: 182 days  Aneita Keens, MD [facility] 09/11/2023, 10:12 AM   If 7PM-7AM, please contact night-coverage [email or web address]

## 2023-09-11 NOTE — Plan of Care (Signed)
  Problem: Safety: Goal: Non-violent Restraint(s) Outcome: Progressing   Problem: Education: Goal: Knowledge of the prescribed therapeutic regimen will improve Outcome: Progressing   Problem: Coping: Goal: Ability to identify and develop effective coping behavior will improve Outcome: Progressing   Problem: Clinical Measurements: Goal: Quality of life will improve Outcome: Progressing   Problem: Respiratory: Goal: Verbalizations of increased ease of respirations will increase Outcome: Progressing   Problem: Role Relationship: Goal: Family's ability to cope with current situation will improve Outcome: Progressing Goal: Ability to verbalize concerns, feelings, and thoughts to partner or family member will improve Outcome: Progressing

## 2023-09-11 NOTE — Plan of Care (Signed)
  Problem: Safety: Goal: Non-violent Restraint(s) Outcome: Progressing   Problem: Education: Goal: Knowledge of the prescribed therapeutic regimen will improve Outcome: Progressing   Problem: Coping: Goal: Ability to identify and develop effective coping behavior will improve Outcome: Progressing   Problem: Clinical Measurements: Goal: Quality of life will improve Outcome: Progressing   Problem: Respiratory: Goal: Verbalizations of increased ease of respirations will increase Outcome: Progressing   Problem: Role Relationship: Goal: Family's ability to cope with current situation will improve Outcome: Progressing Goal: Ability to verbalize concerns, feelings, and thoughts to partner or family member will improve Outcome: Progressing   Problem: Pain Management: Goal: Satisfaction with pain management regimen will improve Outcome: Progressing

## 2023-09-12 NOTE — Plan of Care (Signed)
  Problem: Safety: Goal: Non-violent Restraint(s) Outcome: Progressing   Problem: Education: Goal: Knowledge of the prescribed therapeutic regimen will improve Outcome: Progressing   Problem: Coping: Goal: Ability to identify and develop effective coping behavior will improve Outcome: Progressing   Problem: Clinical Measurements: Goal: Quality of life will improve Outcome: Progressing   Problem: Respiratory: Goal: Verbalizations of increased ease of respirations will increase Outcome: Progressing   Problem: Role Relationship: Goal: Family's ability to cope with current situation will improve Outcome: Progressing Goal: Ability to verbalize concerns, feelings, and thoughts to partner or family member will improve Outcome: Progressing   Problem: Pain Management: Goal: Satisfaction with pain management regimen will improve Outcome: Progressing

## 2023-09-12 NOTE — Progress Notes (Signed)
 PROGRESS NOTE    Natalie Brown  ZOX:[id number] DOB: 1966/02/22 DOA: 01/22/2023 PCP: Darol Elizabeth, NP   Brief Narrative: 58 y.o. female history significant for early onset dementia, bipolar disorder, catatonia essential hypertension sinus tachycardia B12 deficiency severe protein caloric malnutrition he was in the hospital for more than 400 days discharged on 01/22/2023 only to return back to the ED the same day due to agitation. While in the ED evaluated by psychiatry who made changes to his medication. Patient is currently a DNR/DNI patient was admitted to try to improve patient environment, circadian rhythm and provide a window which can help improve the patient psychiatric status. Palliative care is also on board. Difficulty with placement. Patient has been made comfort care.    Assessment and Plan:  Early onset dementia Noted.  Palliative care consulted patient has been transitioned to comfort measures. -Continue Ativan   Bipolar disorder No longer on antipsychotic medication management. Comfort measures.  Hypernatremia Secondary to poor oral intake. Now comfort measures.  Severe protein calorie malnutrition Significant weight loss secondary to poor oral intake. No feeding tube per goals of care. Patient is comfort measures. Continuing to encourage oral/nutrition intake.  CKD stage IIIa Stable.  Primary hypertension No longer on medical therapy. Comfort measures.  Sinus tachycardia Noted. Likely related to prior dehydration. Resolved.   DVT prophylaxis: Lovenox  Code Status:   Code Status: Limited: Do not attempt resuscitation (DNR) -DNR-LIMITED -Do Not Intubate/DNI  Family Communication: None at bedside. Disposition Plan: Discharge pending ongoing TOC   Consultants:  Psychiatry  Procedures:    Antimicrobials:    Subjective: Non-verbal. Lying in bed.  Objective: BP 114/62 (BP Location: Left Leg)   Pulse (!) 110   Temp 99.6 F (37.6 C) (Oral)    Resp 18   Ht 5\' 6"  (1.676 m)   Wt 37.4 kg   SpO2 100%   BMI 13.32 kg/m   Examination:  General exam: Appears calm and comfortable Respiratory system: Respiratory effort normal. Central nervous system: Alert.   Data Reviewed: I have personally reviewed following labs and imaging studies  CBC Lab Results  Component Value Date   WBC 4.5 09/04/2023   RBC 3.89 09/04/2023   HGB 11.6 (L) 09/04/2023   HCT 35.2 (L) 09/04/2023   MCV 90.5 09/04/2023   MCH 29.8 09/04/2023   PLT 268 09/04/2023   MCHC 33.0 09/04/2023   RDW 14.4 09/04/2023   LYMPHSABS 1.9 09/04/2023   MONOABS 0.4 09/04/2023   EOSABS 0.1 09/04/2023   BASOSABS 0.0 09/04/2023     Last metabolic panel Lab Results  Component Value Date   NA 142 09/04/2023   K 3.9 09/04/2023   CL 110 09/04/2023   CO2 25 09/04/2023   BUN 21 (H) 09/04/2023   CREATININE 1.02 (H) 09/04/2023   GLUCOSE 80 09/04/2023   GFRNONAA >60 09/04/2023   GFRAA 65 09/19/2017   CALCIUM  8.5 (L) 09/04/2023   PHOS 3.0 03/26/2023   PROT 7.2 08/03/2023   ALBUMIN  3.3 (L) 08/03/2023   LABGLOB 2.8 06/21/2020   AGRATIO 1.5 06/21/2020   BILITOT 0.6 08/03/2023   ALKPHOS 40 08/03/2023   AST 18 08/03/2023   ALT 15 08/03/2023   ANIONGAP 7 09/04/2023    GFR: Estimated Creatinine Clearance: 36.4 mL/min (A) (by C-G formula based on SCr of 1.02 mg/dL (H)).  No results found for this or any previous visit (from the past 240 hours).    Radiology Studies: No results found.    LOS: 183 days  Aneita Keens, MD [facility] 09/12/2023, 7:52 AM   If 7PM-7AM, please contact night-coverage [email or web address]

## 2023-09-13 NOTE — Progress Notes (Signed)
 PROGRESS NOTE    Natalie Brown  MVH:[id number] DOB: [birth year] DOA: 01/22/2023 PCP: Darol Elizabeth, NP   Brief Narrative: 58 y.o. female history significant for early onset dementia, bipolar disorder, catatonia essential hypertension sinus tachycardia B12 deficiency severe protein caloric malnutrition he was in the hospital for more than 400 days discharged on 01/22/2023 only to return back to the ED the same day due to agitation. While in the ED evaluated by psychiatry who made changes to his medication. Patient is currently a DNR/DNI patient was admitted to try to improve patient environment, circadian rhythm and provide a window which can help improve the patient psychiatric status. Palliative care is also on board. Difficulty with placement. Patient has been made comfort care.    Assessment and Plan:  Early onset dementia Noted.  Palliative care consulted patient has been transitioned to comfort measures. -Continue Ativan   Bipolar disorder No longer on antipsychotic medication management. Comfort measures.  Hypernatremia Secondary to poor oral intake. Now comfort measures.  Severe protein calorie malnutrition Significant weight loss secondary to poor oral intake. No feeding tube per goals of care. Patient is comfort measures. Continuing to encourage oral/nutrition intake.  CKD stage IIIa Stable.  Primary hypertension No longer on medical therapy. Comfort measures.  Sinus tachycardia Noted. Likely related to prior dehydration. Resolved.   DVT prophylaxis: Lovenox  Code Status:   Code Status: Limited: Do not attempt resuscitation (DNR) -DNR-LIMITED -Do Not Intubate/DNI  Family Communication: None at bedside. Disposition Plan: Discharge pending ongoing TOC   Consultants:  Psychiatry  Procedures:    Antimicrobials:    Subjective: Non-verbal. Lying down in her bed.  Objective: BP 114/62 (BP Location: Left Leg)   Pulse (!) 110   Temp 99.6 F (37.6 C)  (Oral)   Resp 18   Ht 5\' 6"  (1.676 m)   Wt 37.4 kg   SpO2 100%   BMI 13.32 kg/m   Examination:  General exam: Appears calm and comfortable Respiratory system: Respiratory effort normal. Central nervous system:    Data Reviewed: I have personally reviewed following labs and imaging studies  CBC Lab Results  Component Value Date   WBC 4.5 09/04/2023   RBC 3.89 09/04/2023   HGB 11.6 (L) 09/04/2023   HCT 35.2 (L) 09/04/2023   MCV 90.5 09/04/2023   MCH 29.8 09/04/2023   PLT 268 09/04/2023   MCHC 33.0 09/04/2023   RDW 14.4 09/04/2023   LYMPHSABS 1.9 09/04/2023   MONOABS 0.4 09/04/2023   EOSABS 0.1 09/04/2023   BASOSABS 0.0 09/04/2023     Last metabolic panel Lab Results  Component Value Date   NA 142 09/04/2023   K 3.9 09/04/2023   CL 110 09/04/2023   CO2 25 09/04/2023   BUN 21 (H) 09/04/2023   CREATININE 1.02 (H) 09/04/2023   GLUCOSE 80 09/04/2023   GFRNONAA >60 09/04/2023   GFRAA 65 09/19/2017   CALCIUM  8.5 (L) 09/04/2023   PHOS 3.0 03/26/2023   PROT 7.2 08/03/2023   ALBUMIN  3.3 (L) 08/03/2023   LABGLOB 2.8 06/21/2020   AGRATIO 1.5 06/21/2020   BILITOT 0.6 08/03/2023   ALKPHOS 40 08/03/2023   AST 18 08/03/2023   ALT 15 08/03/2023   ANIONGAP 7 09/04/2023    GFR: Estimated Creatinine Clearance: 36.4 mL/min (A) (by C-G formula based on SCr of 1.02 mg/dL (H)).  No results found for this or any previous visit (from the past 240 hours).    Radiology Studies: No results found.    LOS: 184  days    Aneita Keens, MD [facility] 09/13/2023, 8:44 AM   If 7PM-7AM, please contact night-coverage [email or web address]

## 2023-09-13 NOTE — Plan of Care (Signed)
  Problem: Safety: Goal: Non-violent Restraint(s) Outcome: Progressing   Problem: Education: Goal: Knowledge of the prescribed therapeutic regimen will improve Outcome: Progressing   Problem: Coping: Goal: Ability to identify and develop effective coping behavior will improve Outcome: Progressing   Problem: Clinical Measurements: Goal: Quality of life will improve Outcome: Progressing   Problem: Respiratory: Goal: Verbalizations of increased ease of respirations will increase Outcome: Progressing   Problem: Role Relationship: Goal: Family's ability to cope with current situation will improve Outcome: Progressing Goal: Ability to verbalize concerns, feelings, and thoughts to partner or family member will improve Outcome: Progressing   Problem: Pain Management: Goal: Satisfaction with pain management regimen will improve Outcome: Progressing

## 2023-09-14 NOTE — Progress Notes (Signed)
 PROGRESS NOTE    Natalie Brown  ZOX:[id number] DOB: 02-14-1966 DOA: 01/22/2023 PCP: Darol Elizabeth, NP   Brief Narrative: 58 y.o. female history significant for early onset dementia, bipolar disorder, catatonia essential hypertension sinus tachycardia B12 deficiency severe protein caloric malnutrition he was in the hospital for more than 400 days discharged on 01/22/2023 only to return back to the ED the same day due to agitation. While in the ED evaluated by psychiatry who made changes to his medication. Patient is currently a DNR/DNI patient was admitted to try to improve patient environment, circadian rhythm and provide a window which can help improve the patient psychiatric status. Palliative care is also on board. Difficulty with placement. Patient has been made comfort care.    Assessment and Plan:  Early onset dementia Noted.  Palliative care consulted patient has been transitioned to comfort measures. -Continue Ativan   Bipolar disorder No longer on antipsychotic medication management. Comfort measures.  Hypernatremia Secondary to poor oral intake. Now comfort measures.  Severe protein calorie malnutrition Significant weight loss secondary to poor oral intake. No feeding tube per goals of care. Patient is comfort measures. Continuing to encourage oral/nutrition intake.  CKD stage IIIa Stable.  Primary hypertension No longer on medical therapy. Comfort measures.  Sinus tachycardia Noted. Likely related to prior dehydration. Resolved.   DVT prophylaxis: Lovenox  Code Status:   Code Status: Limited: Do not attempt resuscitation (DNR) -DNR-LIMITED -Do Not Intubate/DNI  Family Communication: None at bedside. Disposition Plan: Discharge pending ongoing TOC   Consultants:  Psychiatry  Procedures:    Antimicrobials:    Subjective: Non-verbal.   Objective: BP 118/67 (BP Location: Left Arm)   Pulse 78   Temp 97.6 F (36.4 C)   Resp 18   Ht 5\' 6"  (1.676  m)   Wt 37.4 kg   SpO2 100%   BMI 13.32 kg/m   Examination:  Attempted to physically examine patient but she kept attempting to get out of the bed.  General exam: Appears calm and comfortable Respiratory system: Respiratory effort normal. Central nervous system: Alert   Data Reviewed: I have personally reviewed following labs and imaging studies  CBC Lab Results  Component Value Date   WBC 4.5 09/04/2023   RBC 3.89 09/04/2023   HGB 11.6 (L) 09/04/2023   HCT 35.2 (L) 09/04/2023   MCV 90.5 09/04/2023   MCH 29.8 09/04/2023   PLT 268 09/04/2023   MCHC 33.0 09/04/2023   RDW 14.4 09/04/2023   LYMPHSABS 1.9 09/04/2023   MONOABS 0.4 09/04/2023   EOSABS 0.1 09/04/2023   BASOSABS 0.0 09/04/2023     Last metabolic panel Lab Results  Component Value Date   NA 142 09/04/2023   K 3.9 09/04/2023   CL 110 09/04/2023   CO2 25 09/04/2023   BUN 21 (H) 09/04/2023   CREATININE 1.02 (H) 09/04/2023   GLUCOSE 80 09/04/2023   GFRNONAA >60 09/04/2023   GFRAA 65 09/19/2017   CALCIUM  8.5 (L) 09/04/2023   PHOS 3.0 03/26/2023   PROT 7.2 08/03/2023   ALBUMIN  3.3 (L) 08/03/2023   LABGLOB 2.8 06/21/2020   AGRATIO 1.5 06/21/2020   BILITOT 0.6 08/03/2023   ALKPHOS 40 08/03/2023   AST 18 08/03/2023   ALT 15 08/03/2023   ANIONGAP 7 09/04/2023    GFR: Estimated Creatinine Clearance: 36.4 mL/min (A) (by C-G formula based on SCr of 1.02 mg/dL (H)).  No results found for this or any previous visit (from the past 240 hours).  Radiology Studies: No results found.    LOS: 185 days    Aneita Keens, MD [facility] 09/14/2023, 8:24 AM   If 7PM-7AM, please contact night-coverage [email or web address]

## 2023-09-15 MED ORDER — AQUAPHOR EX OINT: TOPICAL_OINTMENT | Freq: Two times a day (BID) | CUTANEOUS | Status: DC | PRN

## 2023-09-15 NOTE — Plan of Care (Signed)
°  Problem: Coping: Goal: Ability to identify and develop effective coping behavior will improve Outcome: Progressing   Problem: Respiratory: Goal: Verbalizations of increased ease of respirations will increase Outcome: Progressing   Problem: Pain Management: Goal: Satisfaction with pain management regimen will improve Outcome: Progressing

## 2023-09-15 NOTE — Progress Notes (Signed)
 PROGRESS NOTE    Natalie Brown  ZOX:[id number] DOB: 10/29/1965 DOA: 01/22/2023 PCP: Darol Elizabeth, NP   Brief Narrative: 58 y.o. female history significant for early onset dementia, bipolar disorder, catatonia essential hypertension sinus tachycardia B12 deficiency severe protein caloric malnutrition he was in the hospital for more than 400 days discharged on 01/22/2023 only to return back to the ED the same day due to agitation. While in the ED evaluated by psychiatry who made changes to his medication. Patient is currently a DNR/DNI patient was admitted to try to improve patient environment, circadian rhythm and provide a window which can help improve the patient psychiatric status. Palliative care is also on board. Difficulty with placement. Patient has been made comfort care.    Assessment and Plan:  Early onset dementia Noted.  Palliative care consulted patient has been transitioned to comfort measures. -Continue Ativan   Bipolar disorder No longer on antipsychotic medication management. Comfort measures.  Hypernatremia Secondary to poor oral intake. Now comfort measures.  Severe protein calorie malnutrition Significant weight loss secondary to poor oral intake. No feeding tube per goals of care. Patient is comfort measures. Continuing to encourage oral/nutrition intake.  CKD stage IIIa Stable.  Primary hypertension No longer on medical therapy. Comfort measures.  Sinus tachycardia Noted. Likely related to prior dehydration. Resolved.   DVT prophylaxis: Lovenox  Code Status:   Code Status: Limited: Do not attempt resuscitation (DNR) -DNR-LIMITED -Do Not Intubate/DNI  Family Communication: None at bedside. Disposition Plan: Discharge pending ongoing TOC   Consultants:  Psychiatry  Procedures:    Antimicrobials:    Subjective: Non-verbal.   Objective: BP 103/65 (BP Location: Right Arm)   Pulse 87   Temp 97.6 F (36.4 C)   Resp 18   Ht 5\' 6"  (1.676  m)   Wt 37.4 kg   SpO2 100%   BMI 13.32 kg/m   Examination:  General exam: Appears mostly calm. Respiratory system: Respiratory effort normal. Skin: Excoriation below left eye   Data Reviewed: I have personally reviewed following labs and imaging studies  CBC Lab Results  Component Value Date   WBC 4.5 09/04/2023   RBC 3.89 09/04/2023   HGB 11.6 (L) 09/04/2023   HCT 35.2 (L) 09/04/2023   MCV 90.5 09/04/2023   MCH 29.8 09/04/2023   PLT 268 09/04/2023   MCHC 33.0 09/04/2023   RDW 14.4 09/04/2023   LYMPHSABS 1.9 09/04/2023   MONOABS 0.4 09/04/2023   EOSABS 0.1 09/04/2023   BASOSABS 0.0 09/04/2023     Last metabolic panel Lab Results  Component Value Date   NA 142 09/04/2023   K 3.9 09/04/2023   CL 110 09/04/2023   CO2 25 09/04/2023   BUN 21 (H) 09/04/2023   CREATININE 1.02 (H) 09/04/2023   GLUCOSE 80 09/04/2023   GFRNONAA >60 09/04/2023   GFRAA 65 09/19/2017   CALCIUM  8.5 (L) 09/04/2023   PHOS 3.0 03/26/2023   PROT 7.2 08/03/2023   ALBUMIN  3.3 (L) 08/03/2023   LABGLOB 2.8 06/21/2020   AGRATIO 1.5 06/21/2020   BILITOT 0.6 08/03/2023   ALKPHOS 40 08/03/2023   AST 18 08/03/2023   ALT 15 08/03/2023   ANIONGAP 7 09/04/2023    GFR: Estimated Creatinine Clearance: 36.4 mL/min (A) (by C-G formula based on SCr of 1.02 mg/dL (H)).  No results found for this or any previous visit (from the past 240 hours).    Radiology Studies: No results found.    LOS: 186 days    Aneita Keens,  MD [facility] 09/15/2023, 12:16 PM   If 7PM-7AM, please contact night-coverage [email or web address]

## 2023-09-15 NOTE — Plan of Care (Signed)
  Problem: Safety: Goal: Non-violent Restraint(s) Outcome: Progressing   Problem: Education: Goal: Knowledge of the prescribed therapeutic regimen will improve Outcome: Progressing   Problem: Coping: Goal: Ability to identify and develop effective coping behavior will improve Outcome: Progressing   Problem: Clinical Measurements: Goal: Quality of life will improve Outcome: Progressing   Problem: Respiratory: Goal: Verbalizations of increased ease of respirations will increase Outcome: Progressing   Problem: Role Relationship: Goal: Family's ability to cope with current situation will improve Outcome: Progressing Goal: Ability to verbalize concerns, feelings, and thoughts to partner or family member will improve Outcome: Progressing   Problem: Pain Management: Goal: Satisfaction with pain management regimen will improve Outcome: Progressing

## 2023-09-16 NOTE — Progress Notes (Signed)
 PROGRESS NOTE    Natalie Brown  WUJ:[id number] DOB: [birth year] DOA: 01/22/2023 PCP: Darol Elizabeth, NP   Brief Narrative: 58 y.o. female history significant for early onset dementia, bipolar disorder, catatonia essential hypertension sinus tachycardia B12 deficiency severe protein caloric malnutrition he was in the hospital for more than 400 days discharged on 01/22/2023 only to return back to the ED the same day due to agitation. While in the ED evaluated by psychiatry who made changes to his medication. Patient is currently a DNR/DNI patient was admitted to try to improve patient environment, circadian rhythm and provide a window which can help improve the patient psychiatric status. Palliative care is also on board. Difficulty with placement. Patient has been made comfort care.    Assessment and Plan:  Early onset dementia Noted.  Palliative care consulted patient has been transitioned to comfort measures. -Continue Ativan   Bipolar disorder No longer on antipsychotic medication management. Comfort measures.  Hypernatremia Secondary to poor oral intake. Now comfort measures.  Severe protein calorie malnutrition Significant weight loss secondary to poor oral intake. No feeding tube per goals of care. Patient is comfort measures. Continuing to encourage oral/nutrition intake.  CKD stage IIIa Stable.  Primary hypertension No longer on medical therapy. Comfort measures.  Sinus tachycardia Noted. Likely related to prior dehydration. Resolved.   DVT prophylaxis: Lovenox  Code Status:   Code Status: Limited: Do not attempt resuscitation (DNR) -DNR-LIMITED -Do Not Intubate/DNI  Family Communication: None at bedside. Disposition Plan: Discharge pending ongoing TOC   Consultants:  Psychiatry  Procedures:    Antimicrobials:    Subjective: Non-verbal.   Objective: BP 103/65 (BP Location: Right Arm)   Pulse 87   Temp 97.6 F (36.4 C)   Resp 18   Ht 5\' 6"  (1.676  m)   Wt 37.4 kg   SpO2 100%   BMI 13.32 kg/m   Examination:  General exam: Appears calm and comfortable. Curled up in bed.  Data Reviewed: I have personally reviewed following labs and imaging studies  CBC Lab Results  Component Value Date   WBC 4.5 09/04/2023   RBC 3.89 09/04/2023   HGB 11.6 (L) 09/04/2023   HCT 35.2 (L) 09/04/2023   MCV 90.5 09/04/2023   MCH 29.8 09/04/2023   PLT 268 09/04/2023   MCHC 33.0 09/04/2023   RDW 14.4 09/04/2023   LYMPHSABS 1.9 09/04/2023   MONOABS 0.4 09/04/2023   EOSABS 0.1 09/04/2023   BASOSABS 0.0 09/04/2023     Last metabolic panel Lab Results  Component Value Date   NA 142 09/04/2023   K 3.9 09/04/2023   CL 110 09/04/2023   CO2 25 09/04/2023   BUN 21 (H) 09/04/2023   CREATININE 1.02 (H) 09/04/2023   GLUCOSE 80 09/04/2023   GFRNONAA >60 09/04/2023   GFRAA 65 09/19/2017   CALCIUM  8.5 (L) 09/04/2023   PHOS 3.0 03/26/2023   PROT 7.2 08/03/2023   ALBUMIN  3.3 (L) 08/03/2023   LABGLOB 2.8 06/21/2020   AGRATIO 1.5 06/21/2020   BILITOT 0.6 08/03/2023   ALKPHOS 40 08/03/2023   AST 18 08/03/2023   ALT 15 08/03/2023   ANIONGAP 7 09/04/2023    GFR: Estimated Creatinine Clearance: 36.4 mL/min (A) (by C-G formula based on SCr of 1.02 mg/dL (H)).  No results found for this or any previous visit (from the past 240 hours).    Radiology Studies: No results found.    LOS: 187 days    Aneita Keens, MD [facility] 09/16/2023, 10:52 AM  If 7PM-7AM, please contact night-coverage [email or web address]

## 2023-09-16 NOTE — Plan of Care (Signed)
  Problem: Coping: Goal: Ability to identify and develop effective coping behavior will improve Outcome: Progressing   Problem: Pain Management: Goal: Satisfaction with pain management regimen will improve Outcome: Progressing

## 2023-09-16 NOTE — Plan of Care (Signed)
  Problem: Safety: Goal: Non-violent Restraint(s) Outcome: Progressing   Problem: Education: Goal: Knowledge of the prescribed therapeutic regimen will improve Outcome: Progressing   Problem: Coping: Goal: Ability to identify and develop effective coping behavior will improve Outcome: Progressing   Problem: Clinical Measurements: Goal: Quality of life will improve Outcome: Progressing   Problem: Respiratory: Goal: Verbalizations of increased ease of respirations will increase Outcome: Progressing   Problem: Role Relationship: Goal: Family's ability to cope with current situation will improve Outcome: Progressing Goal: Ability to verbalize concerns, feelings, and thoughts to partner or family member will improve Outcome: Progressing   Problem: Pain Management: Goal: Satisfaction with pain management regimen will improve Outcome: Progressing

## 2023-09-17 NOTE — Progress Notes (Signed)
 Progress Note   Patient: Natalie Brown [id number]:[id number] DOB: 09/07/1965 DOA: 01/22/2023     188 DOS: the patient was seen and examined on 09/17/2023   Brief hospital course:  58 y.o. F with bipolar disorder and early onset dementia who was admitted for failure to thrive .   This is a long and complicated hospitalization spanning 2 years.  For details, see Dr Pamila Boers note summary from 12/17/22, Dr. Mardella Shadow summary from 01/22/23, or Dr. Shearon Denis summary from 07/17/23.   In brief, she was admitted in 2023 for increasingly erratic behavior and cognitive impairment.  She was evaluated by Neurology and Psychiatry, but no treatable or reversible cause of her impairment could be found.  It appears she has an early onset dementia with comorbid bipolar disorder, leading to weight loss, malnutrition and failure to thrive .   For some reason, during that admission, there was a prolonged delay for over a year in placement in memory care until October 2024, and on arriving at that facility, she exhibited agitation behaviors, and so she was denied entry and sent back to the hospital the same day.     She remained in ER holding for 6 weeks until she was transferred upstairs in Nov 2024 and has been on [facility] since.  [name] nonverbal and does not follow commands. She can ambulate with assistance. [name] comfort care.   Assessment and Plan:   Early onset Dementia with behavioral disturbance Bipolar disorder Severe protein calorie malnutrition  Chronic kidney disease stage IIIa  Hypernatremia   Comfort care.   - On Ativan  0.5 mg TID, and ensure supplementations.  Continue assistance with meals    Subjective: Patient resting comfortably in bed enclosure, lying in fetal position.  Awake, alert, but does not respond to questions or follow commands.  Appears to be intermittently mumbling.  Physical Exam: Vitals:   09/15/23 0451 09/16/23 0930 09/16/23 1700 09/17/23 0700  BP: 103/65 (!) 90/52 (!) 153/100 120/67   Pulse: 87 68 70 71  Resp: 18 20 20    Temp:      TempSrc:      SpO2:  100% 100% 98%  Weight:      Height:        GENERAL: Frail, lying in fetal position, no distress HEENT: Normocephalic, atraumatic CARDIOVASCULAR:  RRR RESPIRATORY: Normal respiratory effort GASTROINTESTINAL: Nondistended EXTREMITIES: Thin, no LE edema bilaterally NEURO: Nonverbal, appears catatonic, does not follow commands SKIN:  No rashes noted PSYCH: Nonverbal   Data Reviewed:  No new imaging to review  Previous records (including but not limited to H&P, progress notes, nursing notes, TOC management) were reviewed in assessment of this patient.  Labs: CBC: No results for input(s): "WBC", "NEUTROABS", "HGB", "HCT", "MCV", "PLT" in the last 168 hours. Basic Metabolic Panel: No results for input(s): "NA", "K", "CL", "CO2", "GLUCOSE", "BUN", "CREATININE", "CALCIUM ", "MG", "PHOS" in the last 168 hours. Liver Function Tests: No results for input(s): "AST", "ALT", "ALKPHOS", "BILITOT", "PROT", "ALBUMIN " in the last 168 hours. CBG: No results for input(s): "GLUCAP" in the last 168 hours.  Scheduled Meds:  feeding supplement  237 mL Oral BID BM   LORazepam   0.5 mg Oral TID   Continuous Infusions: PRN Meds:.acetaminophen  **OR** acetaminophen , guaiFENesin , lactulose , LORazepam , mineral oil-hydrophilic petrolatum , senna-docusate, traZODone   Family Communication: None at bedside  Disposition: Status is: Inpatient Remains inpatient appropriate because: Comfort care     Time spent: 33 minutes  Length of stay: 188 days  Author: Jodeane Mulligan, DO 09/17/2023 10:35  AM  For on call review [email or web address].

## 2023-09-17 NOTE — Plan of Care (Signed)
  Problem: Safety: Goal: Non-violent Restraint(s) Outcome: Progressing   Problem: Education: Goal: Knowledge of the prescribed therapeutic regimen will improve Outcome: Progressing   Problem: Coping: Goal: Ability to identify and develop effective coping behavior will improve Outcome: Progressing   Problem: Clinical Measurements: Goal: Quality of life will improve Outcome: Progressing   Problem: Respiratory: Goal: Verbalizations of increased ease of respirations will increase Outcome: Progressing   Problem: Role Relationship: Goal: Family's ability to cope with current situation will improve Outcome: Progressing Goal: Ability to verbalize concerns, feelings, and thoughts to partner or family member will improve Outcome: Progressing   Problem: Pain Management: Goal: Satisfaction with pain management regimen will improve Outcome: Progressing

## 2023-09-18 NOTE — Progress Notes (Signed)
 Progress Note   Patient: Natalie Brown:[id number] DOB: [birth year] DOA: 01/22/2023     189 DOS: the patient was seen and examined on 09/18/2023   Brief hospital course:  58 y.o. F with bipolar disorder and early onset dementia who was admitted for failure to thrive .   This is a long and complicated hospitalization spanning 2 years.  For details, see Dr Pamila Boers note summary from 12/17/22, Dr. Mardella Shadow summary from 01/22/23, or Dr. Shearon Denis summary from 07/17/23.   In brief, she was admitted in 2023 for increasingly erratic behavior and cognitive impairment.  She was evaluated by Neurology and Psychiatry, but no treatable or reversible cause of her impairment could be found.  It appears she has an early onset dementia with comorbid bipolar disorder, leading to weight loss, malnutrition and failure to thrive .   For some reason, during that admission, there was a prolonged delay for over a year in placement in memory care until October 2024, and on arriving at that facility, she exhibited agitation behaviors, and so she was denied entry and sent back to the hospital the same day.     She remained in ER holding for 6 weeks until she was transferred upstairs in Nov 2024 and has been on [facility] since.  [name] nonverbal and does not follow commands. She can ambulate with assistance. [name] comfort care.   Assessment and Plan:   Early onset Dementia with behavioral disturbance Bipolar disorder Severe protein calorie malnutrition  Chronic kidney disease stage IIIa  Hypernatremia   Comfort care.   - On Ativan  0.5 mg TID, and ensure supplementations.  Continue assistance with meals    Subjective: Patient resting comfortably in bed enclosure, lying in fetal position. Awake, alert, but does not respond to questions or follow commands.  Reportedly ambulates with assistance and tolerate meals.  Physical Exam: Vitals:   09/16/23 0930 09/16/23 1700 09/17/23 0700 09/17/23 1600  BP: (!) 90/52 (!) 153/100  120/67 (!) 105/54  Pulse: 68 70 71 79  Resp: 20 20  20   Temp:      TempSrc:      SpO2: 100% 100% 98% 98%  Weight:      Height:        GENERAL: Frail, lying in fetal position, no distress HEENT: Normocephalic, atraumatic CARDIOVASCULAR:  RRR RESPIRATORY: Normal respiratory effort GASTROINTESTINAL: Nondistended EXTREMITIES: Thin, no LE edema bilaterally NEURO: Nonverbal, appears catatonic, does not follow commands SKIN:  No rashes noted PSYCH: Nonverbal   Data Reviewed:  Results are pending, will review when available.  Previous records (including but not limited to H&P, progress notes, nursing notes, TOC management) were reviewed in assessment of this patient.  Labs: CBC: No results for input(s): "WBC", "NEUTROABS", "HGB", "HCT", "MCV", "PLT" in the last 168 hours. Basic Metabolic Panel: No results for input(s): "NA", "K", "CL", "CO2", "GLUCOSE", "BUN", "CREATININE", "CALCIUM ", "MG", "PHOS" in the last 168 hours. Liver Function Tests: No results for input(s): "AST", "ALT", "ALKPHOS", "BILITOT", "PROT", "ALBUMIN " in the last 168 hours. CBG: No results for input(s): "GLUCAP" in the last 168 hours.  Scheduled Meds:  feeding supplement  237 mL Oral BID BM   LORazepam   0.5 mg Oral TID   Continuous Infusions: PRN Meds:.acetaminophen  **OR** acetaminophen , guaiFENesin , lactulose , LORazepam , mineral oil-hydrophilic petrolatum , senna-docusate, traZODone   Family Communication: None at bedside  Disposition: Status is: Inpatient Remains inpatient appropriate because: Disposition     Time spent: 25 minutes  Length of stay: 189 days  Author: Jodeane Mulligan,  DO 09/18/2023 10:13 AM  For on call review [email or web address].

## 2023-09-18 NOTE — Plan of Care (Signed)
  Problem: Safety: Goal: Non-violent Restraint(s) Outcome: Progressing   Problem: Education: Goal: Knowledge of the prescribed therapeutic regimen will improve Outcome: Progressing   Problem: Coping: Goal: Ability to identify and develop effective coping behavior will improve Outcome: Progressing   Problem: Clinical Measurements: Goal: Quality of life will improve Outcome: Progressing   Problem: Respiratory: Goal: Verbalizations of increased ease of respirations will increase Outcome: Progressing   Problem: Role Relationship: Goal: Family's ability to cope with current situation will improve Outcome: Progressing Goal: Ability to verbalize concerns, feelings, and thoughts to partner or family member will improve Outcome: Progressing   Problem: Pain Management: Goal: Satisfaction with pain management regimen will improve Outcome: Progressing

## 2023-09-19 NOTE — Progress Notes (Signed)
 Progress Note   Patient: Natalie Brown ZOX:[id number] DOB: 1965-08-18 DOA: 01/22/2023     190 DOS: the patient was seen and examined on 09/19/2023   Brief hospital course:  58 y.o. F with bipolar disorder and early onset dementia who was admitted for failure to thrive .   This is a long and complicated hospitalization spanning 2 years.  For details, see Dr Pamila Boers note summary from 12/17/22, Dr. Mardella Shadow summary from 01/22/23, or Dr. Shearon Denis summary from 07/17/23.   In brief, she was admitted in 2023 for increasingly erratic behavior and cognitive impairment.  She was evaluated by Neurology and Psychiatry, but no treatable or reversible cause of her impairment could be found.  It appears she has an early onset dementia with comorbid bipolar disorder, leading to weight loss, malnutrition and failure to thrive .   For some reason, during that admission, there was a prolonged delay for over a year in placement in memory care until October 2024, and on arriving at that facility, she exhibited agitation behaviors, and so she was denied entry and sent back to the hospital the same day.     She remained in ER holding for 6 weeks until she was transferred upstairs in Nov 2024 and has been on [facility] since.  [name] nonverbal and does not follow commands. She can ambulate with assistance. [name] comfort care.   Assessment and Plan:   Early onset Dementia with behavioral disturbance Bipolar disorder Severe protein calorie malnutrition  Chronic kidney disease stage IIIa  Hypernatremia   Comfort care.   - On Ativan  0.5 mg TID, and ensure supplementations.  Continue assistance with meals.  Restraint order renewed.  Working closely with TOC on disposition planning.   Subjective: Patient resting comfortably in bed enclosure, lying in fetal position.  Similar to yesterday.  Awake, alert, but does not respond to questions or follow commands.    Physical Exam: Vitals:   09/17/23 0700 09/17/23 1600 09/18/23  1033 09/18/23 2021  BP: 120/67 (!) 105/54 (!) 84/55 118/69  Pulse: 71 79 94 100  Resp:  20 20 18   Temp:   98.6 F (37 C) (!) 97.4 F (36.3 C)  TempSrc:      SpO2: 98% 98%  100%  Weight:      Height:        GENERAL: Frail, lying in fetal position, no distress HEENT: Normocephalic, atraumatic CARDIOVASCULAR:  RRR RESPIRATORY: Normal respiratory effort GASTROINTESTINAL: Nondistended EXTREMITIES: Thin, no LE edema bilaterally NEURO: Nonverbal, appears catatonic, does not follow commands SKIN:  No rashes noted PSYCH: Nonverbal   Data Reviewed:  No new imaging to review.  Previous records (including but not limited to H&P, progress notes, nursing notes, TOC management) were reviewed in assessment of this patient.  Labs: CBC: No results for input(s): "WBC", "NEUTROABS", "HGB", "HCT", "MCV", "PLT" in the last 168 hours. Basic Metabolic Panel: No results for input(s): "NA", "K", "CL", "CO2", "GLUCOSE", "BUN", "CREATININE", "CALCIUM ", "MG", "PHOS" in the last 168 hours. Liver Function Tests: No results for input(s): "AST", "ALT", "ALKPHOS", "BILITOT", "PROT", "ALBUMIN " in the last 168 hours. CBG: No results for input(s): "GLUCAP" in the last 168 hours.  Scheduled Meds:  feeding supplement  237 mL Oral BID BM   LORazepam   0.5 mg Oral TID   Continuous Infusions: PRN Meds:.acetaminophen  **OR** acetaminophen , guaiFENesin , lactulose , LORazepam , mineral oil-hydrophilic petrolatum , senna-docusate, traZODone   Family Communication: None at bedside  Disposition: Status is: Inpatient Remains inpatient appropriate because: Disposition/comfort care  Time spent: 30 minutes  Length of stay: 190 days  Author: Jodeane Mulligan, DO 09/19/2023 10:22 AM  For on call review [email or web address].

## 2023-09-20 NOTE — Progress Notes (Signed)
 Progress Note   Patient: Natalie Brown [id number] DOB: April 18, 1966 DOA: 01/22/2023  DOS: the patient was seen and examined on 09/20/2023   Brief hospital course:  58 y.o. F with bipolar disorder and early onset dementia who was admitted for failure to thrive .   This is a long and complicated hospitalization spanning 2 years.  For details, see Dr Pamila Boers note summary from 12/17/22, Dr. Mardella Shadow summary from 01/22/23, or Dr. Shearon Denis summary from 07/17/23.   In brief, she was admitted in 2023 for increasingly erratic behavior and cognitive impairment.  She was evaluated by Neurology and Psychiatry, but no treatable or reversible cause of her impairment could be found.  It appears she has an early onset dementia with comorbid bipolar disorder, leading to weight loss, malnutrition and failure to thrive .   For some reason, during that admission, there was a prolonged delay for over a year in placement in memory care until October 2024, and on arriving at that facility, she exhibited agitation behaviors, and so she was denied entry and sent back to the hospital the same day.     She remained in ER holding for 6 weeks until she was transferred upstairs in Nov 2024 and has been on [facility] since.  [name] nonverbal and does not follow commands. She can ambulate with assistance. [name] comfort care.   Assessment and Plan:   Early onset Dementia with behavioral disturbance Bipolar disorder Severe protein calorie malnutrition  Chronic kidney disease stage IIIa  Hypernatremia   Comfort care.   - On Ativan  0.5 mg TID, and ensure supplementations.  Continue assistance with meals.  Restraint order renewed.  Working closely with TOC on disposition planning.   Subjective: Patient resting comfortably in bed enclosure, lying in fetal position.  Similar to yesterday.  Awake, alert, but does not respond to questions or follow commands.   Physical Exam:  Vitals:   09/17/23 1600 09/18/23 1033 09/18/23 2021  09/19/23 1722  BP: (!) 105/54 (!) 84/55 118/69 (!) 89/50  Pulse: 79 94 100 84  Resp: 20 20 18 16   Temp:  98.6 F (37 C) (!) 97.4 F (36.3 C) (!) 97.5 F (36.4 C)  TempSrc:      SpO2: 98%  100% 99%  Weight:      Height:        GENERAL: Frail, lying in fetal position, no distress HEENT: Normocephalic, atraumatic CARDIOVASCULAR:  RRR RESPIRATORY: Normal respiratory effort GASTROINTESTINAL: Nondistended EXTREMITIES: Thin, no LE edema bilaterally NEURO: Nonverbal, appears catatonic, does not follow commands SKIN:  No rashes noted PSYCH: Nonverbal   Data Reviewed:  No new imaging  Previous records (including but not limited to H&P, progress notes, nursing notes, TOC management) were reviewed in assessment of this patient.  Labs: CBC: No results for input(s): "WBC", "NEUTROABS", "HGB", "HCT", "MCV", "PLT" in the last 168 hours. Basic Metabolic Panel: No results for input(s): "NA", "K", "CL", "CO2", "GLUCOSE", "BUN", "CREATININE", "CALCIUM ", "MG", "PHOS" in the last 168 hours. Liver Function Tests: No results for input(s): "AST", "ALT", "ALKPHOS", "BILITOT", "PROT", "ALBUMIN " in the last 168 hours. CBG: No results for input(s): "GLUCAP" in the last 168 hours.  Scheduled Meds:  feeding supplement  237 mL Oral BID BM   LORazepam   0.5 mg Oral TID   Continuous Infusions: PRN Meds:.acetaminophen  **OR** acetaminophen , guaiFENesin , lactulose , LORazepam , mineral oil-hydrophilic petrolatum , senna-docusate, traZODone   Family Communication: None at bedside  Disposition: Status is: Inpatient Remains inpatient appropriate because: Disposition     Time spent:  23 minutes  Length of inpatient stay: 191 days  Author: Jodeane Mulligan, DO 09/20/2023 12:45 PM  For on call review [email or web address].

## 2023-09-21 NOTE — Plan of Care (Signed)
  Problem: Safety: Goal: Non-violent Restraint(s) Outcome: Progressing   Problem: Coping: Goal: Ability to identify and develop effective coping behavior will improve Outcome: Progressing

## 2023-09-21 NOTE — Progress Notes (Signed)
 Progress Note   Patient: Natalie Brown ZOX:[id number] DOB: Mar 10, 1966 DOA: 01/22/2023  DOS: the patient was seen and examined on 09/21/2023   Brief hospital course:  58 y.o. F with bipolar disorder and early onset dementia who was admitted for failure to thrive .   This is a long and complicated hospitalization spanning 2 years.  For details, see Dr Pamila Boers note summary from 12/17/22, Dr. Mardella Shadow summary from 01/22/23, or Dr. Shearon Denis summary from 07/17/23.   In brief, she was admitted in 2023 for increasingly erratic behavior and cognitive impairment.  She was evaluated by Neurology and Psychiatry, but no treatable or reversible cause of her impairment could be found.  It appears she has an early onset dementia with comorbid bipolar disorder, leading to weight loss, malnutrition and failure to thrive .   For some reason, during that admission, there was a prolonged delay for over a year in placement in memory care until October 2024, and on arriving at that facility, she exhibited agitation behaviors, and so she was denied entry and sent back to the hospital the same day.     She remained in ER holding for 6 weeks until she was transferred upstairs in Nov 2024 and has been on [facility] since.  [name] nonverbal and does not follow commands. She can ambulate with assistance. [name] comfort care.   Assessment and Plan:   Early onset Dementia with behavioral disturbance Bipolar disorder Severe protein calorie malnutrition  Chronic kidney disease stage IIIa  Hypernatremia   Comfort care.   - On Ativan  0.5 mg TID, and ensure supplementations.  Continue assistance with meals.  Restraint order renewed.  Working closely with TOC on disposition planning.   Subjective: Patient resting comfortably in bed enclosure, lying in fetal position. Similar to yesterday. Awake, alert, but does not respond to questions or follow commands.   Physical Exam:  Vitals:   09/18/23 1033 09/18/23 2021 09/19/23 1722  09/20/23 2056  BP: (!) 84/55 118/69 (!) 89/50 100/61  Pulse: 94 100 84 78  Resp: 20 18 16 16   Temp: 98.6 F (37 C) (!) 97.4 F (36.3 C) (!) 97.5 F (36.4 C)   TempSrc:      SpO2:  100% 99% 99%  Weight:      Height:        GENERAL: Frail, lying in fetal position, no distress HEENT: Normocephalic, atraumatic CARDIOVASCULAR:  RRR RESPIRATORY: Normal respiratory effort GASTROINTESTINAL: Nondistended EXTREMITIES: Thin, no LE edema bilaterally NEURO: Nonverbal, appears catatonic, does not follow commands SKIN:  No rashes noted PSYCH: Nonverbal   Data Reviewed:  There are no new results to review at this time.  Previous records (including but not limited to H&P, progress notes, nursing notes, TOC management) were reviewed in assessment of this patient.  Labs: CBC: No results for input(s): "WBC", "NEUTROABS", "HGB", "HCT", "MCV", "PLT" in the last 168 hours. Basic Metabolic Panel: No results for input(s): "NA", "K", "CL", "CO2", "GLUCOSE", "BUN", "CREATININE", "CALCIUM ", "MG", "PHOS" in the last 168 hours. Liver Function Tests: No results for input(s): "AST", "ALT", "ALKPHOS", "BILITOT", "PROT", "ALBUMIN " in the last 168 hours. CBG: No results for input(s): "GLUCAP" in the last 168 hours.  Scheduled Meds:  feeding supplement  237 mL Oral BID BM   LORazepam   0.5 mg Oral TID   Continuous Infusions: PRN Meds:.acetaminophen  **OR** acetaminophen , guaiFENesin , lactulose , LORazepam , mineral oil-hydrophilic petrolatum , senna-docusate, traZODone   Family Communication: none  Disposition: Status is: Inpatient Remains inpatient appropriate because: comfort care  Time spent: 22 minutes  Length of inpatient stay: 192 days  Author: Jodeane Mulligan, DO 09/21/2023 1:23 PM  For on call review [email or web address].

## 2023-09-22 NOTE — Progress Notes (Signed)
 Progress Note   Patient: Natalie Brown [id number] DOB: [birth year] DOA: 01/22/2023  DOS: the patient was seen and examined on 09/22/2023   Brief hospital course:  58 y.o. F with bipolar disorder and early onset dementia who was admitted for failure to thrive .   This is a long and complicated hospitalization spanning 2 years.  For details, see Dr Pamila Boers note summary from 12/17/22, Dr. Mardella Shadow summary from 01/22/23, or Dr. Shearon Denis summary from 07/17/23.   In brief, she was admitted in 2023 for increasingly erratic behavior and cognitive impairment.  She was evaluated by Neurology and Psychiatry, but no treatable or reversible cause of her impairment could be found.  It appears she has an early onset dementia with comorbid bipolar disorder, leading to weight loss, malnutrition and failure to thrive .   For some reason, during that admission, there was a prolonged delay for over a year in placement in memory care until October 2024, and on arriving at that facility, she exhibited agitation behaviors, and so she was denied entry and sent back to the hospital the same day.     She remained in ER holding for 6 weeks until she was transferred upstairs in Nov 2024 and has been on [facility] since.  [name] nonverbal and does not follow commands. She can ambulate with assistance. [name] comfort care.   Assessment and Plan:   Early onset Dementia with behavioral disturbance Bipolar disorder Severe protein calorie malnutrition  Chronic kidney disease stage IIIa  Hypernatremia   Comfort care.   - On Ativan  0.5 mg TID, and ensure supplementations.  Continue assistance with meals.  Restraint order renewed.  Working closely with TOC on disposition planning.   Subjective: Patient resting comfortably bed enclosure.  Legs extended today, not in the fetal position.  Does not appear to be in any acute distress.  Does not respond to questions nor follow commands.  Physical Exam:  Vitals:   09/19/23 1722  09/20/23 2056 09/22/23 0541 09/22/23 0737  BP: (!) 89/50 100/61 122/60 108/77  Pulse: 84 78 95 63  Resp: 16 16 17    Temp: (!) 97.5 F (36.4 C)  97.9 F (36.6 C) 98.1 F (36.7 C)  TempSrc:   Oral   SpO2: 99% 99% 99%   Weight:      Height:        GENERAL: Frail, no distress HEENT: Normocephalic, atraumatic CARDIOVASCULAR:  RRR RESPIRATORY: Normal respiratory effort GASTROINTESTINAL: Nondistended EXTREMITIES: Thin, no LE edema bilaterally NEURO: Nonverbal, appears catatonic, does not follow commands SKIN:  No rashes noted PSYCH: Nonverbal   Data Reviewed:  There are no new results to review at this time.  Previous records (including but not limited to H&P, progress notes, nursing notes, TOC management) were reviewed in assessment of this patient.  Labs: CBC: No results for input(s): "WBC", "NEUTROABS", "HGB", "HCT", "MCV", "PLT" in the last 168 hours. Basic Metabolic Panel: No results for input(s): "NA", "K", "CL", "CO2", "GLUCOSE", "BUN", "CREATININE", "CALCIUM ", "MG", "PHOS" in the last 168 hours. Liver Function Tests: No results for input(s): "AST", "ALT", "ALKPHOS", "BILITOT", "PROT", "ALBUMIN " in the last 168 hours. CBG: No results for input(s): "GLUCAP" in the last 168 hours.  Scheduled Meds:  feeding supplement  237 mL Oral BID BM   LORazepam   0.5 mg Oral TID   Continuous Infusions: PRN Meds:.acetaminophen  **OR** acetaminophen , guaiFENesin , lactulose , LORazepam , mineral oil-hydrophilic petrolatum , senna-docusate, traZODone   Family Communication: None at bedside  Disposition: Status is: Inpatient Remains inpatient appropriate because: Comfort  care     Time spent: 22 minutes  Length of inpatient stay: 193 days  Author: Jodeane Mulligan, DO 09/22/2023 12:36 PM  For on call review [email or web address].

## 2023-09-23 NOTE — Plan of Care (Signed)
  Problem: Clinical Measurements: Goal: Quality of life will improve Outcome: Adequate for Discharge   Problem: Role Relationship: Goal: Family's ability to cope with current situation will improve Outcome: Adequate for Discharge

## 2023-09-23 NOTE — Progress Notes (Signed)
 Progress Note   Patient: Natalie Brown [id number] DOB: 05/04/1965 DOA: 01/22/2023  DOS: the patient was seen and examined on 09/23/2023   Brief hospital course:  58 y.o. F with bipolar disorder and early onset dementia who was admitted for failure to thrive .   This is a long and complicated hospitalization spanning 2 years.  For details, see Dr Pamila Boers note summary from 12/17/22, Dr. Mardella Shadow summary from 01/22/23, or Dr. Shearon Denis summary from 07/17/23.   In brief, she was admitted in 2023 for increasingly erratic behavior and cognitive impairment.  She was evaluated by Neurology and Psychiatry, but no treatable or reversible cause of her impairment could be found.  It appears she has an early onset dementia with comorbid bipolar disorder, leading to weight loss, malnutrition and failure to thrive .   For some reason, during that admission, there was a prolonged delay for over a year in placement in memory care until October 2024, and on arriving at that facility, she exhibited agitation behaviors, and so she was denied entry and sent back to the hospital the same day.     She remained in ER holding for 6 weeks until she was transferred upstairs in Nov 2024 and has been on [facility] since.  [name] nonverbal and does not follow commands. She can ambulate with assistance. [name] comfort care.   Assessment and Plan:   Early onset Dementia with behavioral disturbance Bipolar disorder Severe protein calorie malnutrition  Chronic kidney disease stage IIIa  Hypernatremia   Comfort care.   - On Ativan  0.5 mg TID, and ensure supplementations.  Continue assistance with meals.  Restraint order renewed.  Working closely with TOC on disposition planning.   Subjective: Patient resting comfortably in bed enclosure, lying in fetal position. Similar to yesterday. Awake, alert, but does not respond to questions or follow commands.   Physical Exam:  Vitals:   09/19/23 1722 09/20/23 2056 09/22/23 0541  09/22/23 0737  BP: (!) 89/50 100/61 122/60 108/77  Pulse: 84 78 95 63  Resp: 16 16 17    Temp: (!) 97.5 F (36.4 C)  97.9 F (36.6 C) 98.1 F (36.7 C)  TempSrc:   Oral   SpO2: 99% 99% 99%   Weight:      Height:        GENERAL: Frail, lying in fetal position, no distress HEENT: Normocephalic, atraumatic CARDIOVASCULAR:  RRR RESPIRATORY: Normal respiratory effort GASTROINTESTINAL: Nondistended EXTREMITIES: Thin, no LE edema bilaterally NEURO: Nonverbal, appears catatonic, does not follow commands SKIN:  No rashes noted PSYCH: Nonverbal   Data Reviewed:  There are no new results to review at this time.  Previous records (including but not limited to H&P, progress notes, nursing notes, TOC management) were reviewed in assessment of this patient.  Labs: CBC: No results for input(s): "WBC", "NEUTROABS", "HGB", "HCT", "MCV", "PLT" in the last 168 hours. Basic Metabolic Panel: No results for input(s): "NA", "K", "CL", "CO2", "GLUCOSE", "BUN", "CREATININE", "CALCIUM ", "MG", "PHOS" in the last 168 hours. Liver Function Tests: No results for input(s): "AST", "ALT", "ALKPHOS", "BILITOT", "PROT", "ALBUMIN " in the last 168 hours. CBG: No results for input(s): "GLUCAP" in the last 168 hours.  Scheduled Meds:  feeding supplement  237 mL Oral BID BM   LORazepam   0.5 mg Oral TID   Continuous Infusions: PRN Meds:.acetaminophen  **OR** acetaminophen , guaiFENesin , lactulose , LORazepam , mineral oil-hydrophilic petrolatum , senna-docusate, traZODone   Family Communication: none  Disposition: Status is: Inpatient Remains inpatient appropriate because: comfort care     Time  spent: 25 minutes  Length of inpatient stay: 194 days  Author: Jodeane Mulligan, DO 09/23/2023 12:56 PM  For on call review [email or web address].

## 2023-09-24 NOTE — Plan of Care (Signed)
  Problem: Safety: Goal: Non-violent Restraint(s) Outcome: Progressing   Problem: Clinical Measurements: Goal: Quality of life will improve Outcome: Progressing   Problem: Role Relationship: Goal: Family's ability to cope with current situation will improve Outcome: Progressing

## 2023-09-24 NOTE — Progress Notes (Signed)
 PROGRESS NOTE    Natalie Brown  ZOX:[id number] DOB: [birth year] DOA: 01/22/2023 PCP: Darol Elizabeth, NP    Brief Narrative:  58 y.o. F with bipolar disorder and early onset dementia who was admitted for failure to thrive .   This is a long and complicated hospitalization spanning 2 years.  For details, see Dr Pamila Boers note summary from 12/17/22, Dr. Mardella Shadow summary from 01/22/23, or Dr. Shearon Denis summary from 07/17/23.   In brief, she was admitted in 2023 for increasingly erratic behavior and cognitive impairment.  She was evaluated by Neurology and Psychiatry, but no treatable or reversible cause of her impairment could be found.  It appears she has an early onset dementia with comorbid bipolar disorder, leading to weight loss, malnutrition and failure to thrive .   For some reason, during that admission, there was a prolonged delay for over a year in placement in memory care until October 2024, and on arriving at that facility, she exhibited agitation behaviors, and so she was denied entry and sent back to the hospital the same day.     She remained in ER holding for 6 weeks until she was transferred upstairs in Nov 2024 and has been on [facility] since.  [name] nonverbal and does not follow commands. She can ambulate with assistance. [name] comfort care.   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   - On Ativan  0.5 mg TID, and ensure supplementations.  Continue assistance with meals.  Restraint order renewed.  Working closely with TOC on disposition planning.    Patient is DNR/DNI Comfort care Awaiting safe  disposition   Subjective:  Laying in the bed.  Does not respond to any questions  Examination: Laying in the bed does not respond to any questions. Does not appear to be in acute distress                Diet Orders (From admission, onward)     Start     Ordered   03/13/23 1330  Diet regular Room service appropriate? Yes; Fluid consistency: Thin  Diet effective now       Question Answer Comment  Room service appropriate? Yes   Fluid consistency: Thin      03/13/23 1330            Objective: Vitals:   09/22/23 0541 09/22/23 0737 09/23/23 1616 09/24/23 0300  BP: 122/60 108/77 (!) 85/64 106/66  Pulse: 95 63 86 94  Resp: 17  17 18   Temp: 97.9 F (36.6 C) 98.1 F (36.7 C)  98.6 F (37 C)  TempSrc: Oral   Axillary  SpO2: 99%     Weight:      Height:        Intake/Output Summary (Last 24 hours) at 09/24/2023 1144 Last data filed at 09/23/2023 1250 Gross per 24 hour  Intake 240 ml  Output --  Net 240 ml   Filed Weights   06/02/23 0500 07/02/23 1700 07/17/23 1531  Weight: 42 kg 42.2 kg 37.4 kg    Scheduled Meds:  feeding supplement  237 mL Oral BID BM   LORazepam   0.5 mg Oral  TID   Continuous Infusions:  Nutritional status Signs/Symptoms: severe fat depletion, severe muscle depletion, energy intake < or equal to 50% for > or equal to 5 days Interventions: Magic cup Body mass index is 13.32 kg/m.  Data Reviewed:   CBC: No results for input(s): "WBC", "NEUTROABS", "HGB", "HCT", "MCV", "PLT" in the last 168 hours. Basic Metabolic Panel: No results for input(s): "NA", "K", "CL", "CO2", "GLUCOSE", "BUN", "CREATININE", "CALCIUM ", "MG", "PHOS" in the last 168 hours. GFR: Estimated Creatinine Clearance: 36.4 mL/min (A) (by C-G formula based on SCr of 1.02 mg/dL (H)). Liver Function Tests: No results for input(s): "AST", "ALT", "ALKPHOS", "BILITOT", "PROT", "ALBUMIN " in the last 168 hours. No results for input(s): "LIPASE", "AMYLASE" in the last 168  hours. No results for input(s): "AMMONIA" in the last 168 hours. Coagulation Profile: No results for input(s): "INR", "PROTIME" in the last 168 hours. Cardiac Enzymes: No results for input(s): "CKTOTAL", "CKMB", "CKMBINDEX", "TROPONINI" in the last 168 hours. BNP (last 3 results) No results for input(s): "PROBNP" in the last 8760 hours. HbA1C: No results for input(s): "HGBA1C" in the last 72 hours. CBG: No results for input(s): "GLUCAP" in the last 168 hours. Lipid Profile: No results for input(s): "CHOL", "HDL", "LDLCALC", "TRIG", "CHOLHDL", "LDLDIRECT" in the last 72 hours. Thyroid Function Tests: No results for input(s): "TSH", "T4TOTAL", "FREET4", "T3FREE", "THYROIDAB" in the last 72 hours. Anemia Panel: No results for input(s): "VITAMINB12", "FOLATE", "FERRITIN", "TIBC", "IRON", "RETICCTPCT" in the last 72 hours. Sepsis Labs: No results for input(s): "PROCALCITON", "LATICACIDVEN" in the last 168 hours.  No results found for this or any previous visit (from the past 240 hours).       Radiology Studies: No results found.         LOS: 195 days   Time spent= 35 mins    Maggie Schooner, MD [facility]  If 7PM-7AM, please contact night-coverage  09/24/2023, 11:44 AM

## 2023-09-24 NOTE — Plan of Care (Signed)
  Problem: Safety: Goal: Non-violent Restraint(s) Outcome: Progressing   Problem: Education: Goal: Knowledge of the prescribed therapeutic regimen will improve Outcome: Progressing   Problem: Clinical Measurements: Goal: Quality of life will improve Outcome: Progressing   Problem: Role Relationship: Goal: Family's ability to cope with current situation will improve Outcome: Progressing

## 2023-09-25 NOTE — Plan of Care (Signed)
  Problem: Safety: Goal: Non-violent Restraint(s) Outcome: Progressing   Problem: Clinical Measurements: Goal: Quality of life will improve Outcome: Progressing   Problem: Role Relationship: Goal: Family's ability to cope with current situation will improve Outcome: Progressing

## 2023-09-25 NOTE — Progress Notes (Signed)
 PROGRESS NOTE    Natalie Brown  UXL:[id number] DOB: 01-11-1966 DOA: 01/22/2023 PCP: Darol Elizabeth, NP    Brief Narrative:  58 y.o. F with bipolar disorder and early onset dementia who was admitted for failure to thrive .   This is a long and complicated hospitalization spanning 2 years.  For details, see Dr Pamila Boers note summary from 12/17/22, Dr. Mardella Shadow summary from 01/22/23, or Dr. Shearon Denis summary from 07/17/23.   In brief, she was admitted in 2023 for increasingly erratic behavior and cognitive impairment.  She was evaluated by Neurology and Psychiatry, but no treatable or reversible cause of her impairment could be found.  It appears she has an early onset dementia with comorbid bipolar disorder, leading to weight loss, malnutrition and failure to thrive .   For some reason, during that admission, there was a prolonged delay for over a year in placement in memory care until October 2024, and on arriving at that facility, she exhibited agitation behaviors, and so she was denied entry and sent back to the hospital the same day.     She remained in ER holding for 6 weeks until she was transferred upstairs in Nov 2024 and has been on [facility] since.  [name] nonverbal and does not follow commands. She can ambulate with assistance. [name] comfort care.   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   - On Ativan  0.5 mg TID, and ensure supplementations.  Continue assistance with meals.  Restraint order renewed.  Working closely with TOC on disposition planning.    Patient is DNR/DNI Comfort care Awaiting safe  disposition   Subjective:  Laying in the bed.  Does not respond to any questions  Examination: Laying in the bed does not respond to any questions. Does not appear to be in acute distress                Diet Orders (From admission, onward)     Start     Ordered   03/13/23 1330  Diet regular Room service appropriate? Yes; Fluid consistency: Thin  Diet effective now       Question Answer Comment  Room service appropriate? Yes   Fluid consistency: Thin      03/13/23 1330            Objective: Vitals:   09/22/23 0541 09/22/23 0737 09/23/23 1616 09/24/23 0300  BP: 122/60 108/77 (!) 85/64 106/66  Pulse: 95 63 86 94  Resp: 17  17 18   Temp: 97.9 F (36.6 C) 98.1 F (36.7 C)  98.6 F (37 C)  TempSrc: Oral   Axillary  SpO2: 99%     Weight:      Height:        Intake/Output Summary (Last 24 hours) at 09/25/2023 1303 Last data filed at 09/25/2023 0923 Gross per 24 hour  Intake 120 ml  Output --  Net 120 ml   Filed Weights   06/02/23 0500 07/02/23 1700 07/17/23 1531  Weight: 42 kg 42.2 kg 37.4 kg    Scheduled Meds:  feeding supplement  237 mL Oral BID BM   LORazepam   0.5 mg Oral  TID   Continuous Infusions:  Nutritional status Signs/Symptoms: severe fat depletion, severe muscle depletion, energy intake < or equal to 50% for > or equal to 5 days Interventions: Magic cup Body mass index is 13.32 kg/m.  Data Reviewed:   CBC: No results for input(s): "WBC", "NEUTROABS", "HGB", "HCT", "MCV", "PLT" in the last 168 hours. Basic Metabolic Panel: No results for input(s): "NA", "K", "CL", "CO2", "GLUCOSE", "BUN", "CREATININE", "CALCIUM ", "MG", "PHOS" in the last 168 hours. GFR: CrCl cannot be calculated (Patient's most recent lab result is older than the maximum 21 days allowed.). Liver Function Tests: No results for input(s): "AST", "ALT", "ALKPHOS", "BILITOT", "PROT", "ALBUMIN " in the last 168 hours. No results for input(s): "LIPASE", "AMYLASE" in the  last 168 hours. No results for input(s): "AMMONIA" in the last 168 hours. Coagulation Profile: No results for input(s): "INR", "PROTIME" in the last 168 hours. Cardiac Enzymes: No results for input(s): "CKTOTAL", "CKMB", "CKMBINDEX", "TROPONINI" in the last 168 hours. BNP (last 3 results) No results for input(s): "PROBNP" in the last 8760 hours. HbA1C: No results for input(s): "HGBA1C" in the last 72 hours. CBG: No results for input(s): "GLUCAP" in the last 168 hours. Lipid Profile: No results for input(s): "CHOL", "HDL", "LDLCALC", "TRIG", "CHOLHDL", "LDLDIRECT" in the last 72 hours. Thyroid Function Tests: No results for input(s): "TSH", "T4TOTAL", "FREET4", "T3FREE", "THYROIDAB" in the last 72 hours. Anemia Panel: No results for input(s): "VITAMINB12", "FOLATE", "FERRITIN", "TIBC", "IRON", "RETICCTPCT" in the last 72 hours. Sepsis Labs: No results for input(s): "PROCALCITON", "LATICACIDVEN" in the last 168 hours.  No results found for this or any previous visit (from the past 240 hours).       Radiology Studies: No results found.         LOS: 196 days   Time spent= 35 mins    Maggie Schooner, MD [facility]  If 7PM-7AM, please contact night-coverage  09/25/2023, 1:03 PM

## 2023-09-26 NOTE — Plan of Care (Signed)
  Problem: Safety: Goal: Non-violent Restraint(s) Outcome: Progressing   Problem: Clinical Measurements: Goal: Quality of life will improve Outcome: Progressing   Problem: Role Relationship: Goal: Family's ability to cope with current situation will improve Outcome: Progressing

## 2023-09-26 NOTE — Progress Notes (Signed)
 PROGRESS NOTE    Natalie Brown  AOZ:[id number] DOB: [birth year] DOA: 01/22/2023 PCP: Darol Elizabeth, NP    Brief Narrative:  58 y.o. F with bipolar disorder and early onset dementia who was admitted for failure to thrive .   This is a long and complicated hospitalization spanning 2 years.  For details, see Dr Pamila Boers note summary from 12/17/22, Dr. Mardella Shadow summary from 01/22/23, or Dr. Shearon Denis summary from 07/17/23.   In brief, she was admitted in 2023 for increasingly erratic behavior and cognitive impairment.  She was evaluated by Neurology and Psychiatry, but no treatable or reversible cause of her impairment could be found.  It appears she has an early onset dementia with comorbid bipolar disorder, leading to weight loss, malnutrition and failure to thrive .   For some reason, during that admission, there was a prolonged delay for over a year in placement in memory care until October 2024, and on arriving at that facility, she exhibited agitation behaviors, and so she was denied entry and sent back to the hospital the same day.     She remained in ER holding for 6 weeks until she was transferred upstairs in Nov 2024 and has been on [facility] since.  [name] nonverbal and does not follow commands. She can ambulate with assistance. [name] comfort care.   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   - On Ativan  0.5 mg TID, and ensure supplementations.  Continue assistance with meals.  Restraint order renewed.  Working closely with TOC on disposition planning.    Patient is DNR/DNI Comfort care Awaiting safe  disposition   Subjective:  Laying in the bed.  Does not respond to any questions  Examination: Laying in the bed does not respond to any questions. Does not appear to be in acute distress                Diet Orders (From admission, onward)     Start     Ordered   03/13/23 1330  Diet regular Room service appropriate? Yes; Fluid consistency: Thin  Diet effective now       Question Answer Comment  Room service appropriate? Yes   Fluid consistency: Thin      03/13/23 1330            Objective: Vitals:   09/22/23 0737 09/23/23 1616 09/24/23 0300 09/25/23 2109  BP: 108/77 (!) 85/64 106/66 (!) 91/58  Pulse: 63 86 94 66  Resp:  17 18 20   Temp: 98.1 F (36.7 C)  98.6 F (37 C) 97.6 F (36.4 C)  TempSrc:   Axillary   SpO2:      Weight:      Height:        Intake/Output Summary (Last 24 hours) at 09/26/2023 0856 Last data filed at 09/26/2023 0842 Gross per 24 hour  Intake 720 ml  Output --  Net 720 ml   Filed Weights   06/02/23 0500 07/02/23 1700 07/17/23 1531  Weight: 42 kg 42.2 kg 37.4 kg    Scheduled Meds:  feeding supplement  237 mL Oral BID BM   LORazepam   0.5 mg  Oral TID   Continuous Infusions:  Nutritional status Signs/Symptoms: severe fat depletion, severe muscle depletion, energy intake < or equal to 50% for > or equal to 5 days Interventions: Magic cup Body mass index is 13.32 kg/m.  Data Reviewed:   CBC: No results for input(s): "WBC", "NEUTROABS", "HGB", "HCT", "MCV", "PLT" in the last 168 hours. Basic Metabolic Panel: No results for input(s): "NA", "K", "CL", "CO2", "GLUCOSE", "BUN", "CREATININE", "CALCIUM ", "MG", "PHOS" in the last 168 hours. GFR: CrCl cannot be calculated (Patient's most recent lab result is older than the maximum 21 days allowed.). Liver Function Tests: No results for input(s): "AST", "ALT", "ALKPHOS", "BILITOT", "PROT", "ALBUMIN " in the last 168 hours. No results for input(s): "LIPASE", "AMYLASE" in the last  168 hours. No results for input(s): "AMMONIA" in the last 168 hours. Coagulation Profile: No results for input(s): "INR", "PROTIME" in the last 168 hours. Cardiac Enzymes: No results for input(s): "CKTOTAL", "CKMB", "CKMBINDEX", "TROPONINI" in the last 168 hours. BNP (last 3 results) No results for input(s): "PROBNP" in the last 8760 hours. HbA1C: No results for input(s): "HGBA1C" in the last 72 hours. CBG: No results for input(s): "GLUCAP" in the last 168 hours. Lipid Profile: No results for input(s): "CHOL", "HDL", "LDLCALC", "TRIG", "CHOLHDL", "LDLDIRECT" in the last 72 hours. Thyroid Function Tests: No results for input(s): "TSH", "T4TOTAL", "FREET4", "T3FREE", "THYROIDAB" in the last 72 hours. Anemia Panel: No results for input(s): "VITAMINB12", "FOLATE", "FERRITIN", "TIBC", "IRON", "RETICCTPCT" in the last 72 hours. Sepsis Labs: No results for input(s): "PROCALCITON", "LATICACIDVEN" in the last 168 hours.  No results found for this or any previous visit (from the past 240 hours).       Radiology Studies: No results found.         LOS: 197 days   Time spent= 35 mins    Maggie Schooner, MD [facility]  If 7PM-7AM, please contact night-coverage  09/26/2023, 8:56 AM

## 2023-09-27 NOTE — Plan of Care (Signed)
  Problem: Safety: Goal: Non-violent Restraint(s) Outcome: Progressing   Problem: Clinical Measurements: Goal: Quality of life will improve Outcome: Progressing   Problem: Role Relationship: Goal: Family's ability to cope with current situation will improve Outcome: Progressing

## 2023-09-27 NOTE — Progress Notes (Signed)
 PROGRESS NOTE    Natalie Brown  YQM:[id number] DOB: 05-Aug-1965 DOA: 01/22/2023 PCP: Darol Elizabeth, NP    Brief Narrative:  58 y.o. F with bipolar disorder and early onset dementia who was admitted for failure to thrive .   This is a long and complicated hospitalization spanning 2 years.  For details, see Dr Pamila Boers note summary from 12/17/22, Dr. Mardella Shadow summary from 01/22/23, or Dr. Shearon Denis summary from 07/17/23.   In brief, she was admitted in 2023 for increasingly erratic behavior and cognitive impairment.  She was evaluated by Neurology and Psychiatry, but no treatable or reversible cause of her impairment could be found.  It appears she has an early onset dementia with comorbid bipolar disorder, leading to weight loss, malnutrition and failure to thrive .   For some reason, during that admission, there was a prolonged delay for over a year in placement in memory care until October 2024, and on arriving at that facility, she exhibited agitation behaviors, and so she was denied entry and sent back to the hospital the same day.     She remained in ER holding for 6 weeks until she was transferred upstairs in Nov 2024 and has been on [facility] since.  [name] nonverbal and does not follow commands. She can ambulate with assistance. [name] comfort care.   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   - On Ativan  0.5 mg TID, and ensure supplementations.  Continue assistance with meals.  Restraint order renewed.  Working closely with TOC on disposition planning.    Patient is DNR/DNI Comfort care Awaiting safe  disposition   Subjective:  Laying in the bed.  Does not respond to any questions  Examination: Laying in the bed does not respond to any questions. Does not appear to be in acute distress                Diet Orders (From admission, onward)     Start     Ordered   03/13/23 1330  Diet regular Room service appropriate? Yes; Fluid consistency: Thin  Diet effective now       Question Answer Comment  Room service appropriate? Yes   Fluid consistency: Thin      03/13/23 1330            Objective: Vitals:   09/24/23 0300 09/25/23 2109 09/26/23 1627 09/26/23 2137  BP: 106/66 (!) 91/58 109/72 120/73  Pulse: 94 66 79 (!) 102  Resp: 18 20 19 20   Temp: 98.6 F (37 C) 97.6 F (36.4 C) 97.6 F (36.4 C) 97.7 F (36.5 C)  TempSrc: Axillary  Oral   SpO2:   100%   Weight:      Height:        Intake/Output Summary (Last 24 hours) at 09/27/2023 0959 Last data filed at 09/27/2023 0916 Gross per 24 hour  Intake 478 ml  Output --  Net 478 ml   Filed Weights   06/02/23 0500 07/02/23 1700 07/17/23 1531  Weight: 42 kg 42.2 kg 37.4 kg    Scheduled Meds:  feeding supplement  237 mL Oral BID BM   LORazepam   0.5 mg Oral TID   Continuous Infusions:  Nutritional status Signs/Symptoms: severe fat depletion, severe muscle depletion, energy intake < or equal to 50% for > or equal to 5 days Interventions: Magic cup Body mass index is 13.32 kg/m.  Data Reviewed:   CBC: No results for input(s): "WBC", "NEUTROABS", "HGB", "HCT", "MCV", "PLT" in the last 168 hours. Basic Metabolic Panel: No results for input(s): "NA", "K", "CL", "CO2", "GLUCOSE", "BUN", "CREATININE", "CALCIUM ", "MG", "PHOS" in the last 168 hours. GFR: CrCl cannot be calculated (Patient's most recent lab result is older than the maximum 21 days allowed.). Liver Function Tests: No results for input(s): "AST", "ALT", "ALKPHOS", "BILITOT", "PROT", "ALBUMIN " in the last 168 hours. No results for input(s):  "LIPASE", "AMYLASE" in the last 168 hours. No results for input(s): "AMMONIA" in the last 168 hours. Coagulation Profile: No results for input(s): "INR", "PROTIME" in the last 168 hours. Cardiac Enzymes: No results for input(s): "CKTOTAL", "CKMB", "CKMBINDEX", "TROPONINI" in the last 168 hours. BNP (last 3 results) No results for input(s): "PROBNP" in the last 8760 hours. HbA1C: No results for input(s): "HGBA1C" in the last 72 hours. CBG: No results for input(s): "GLUCAP" in the last 168 hours. Lipid Profile: No results for input(s): "CHOL", "HDL", "LDLCALC", "TRIG", "CHOLHDL", "LDLDIRECT" in the last 72 hours. Thyroid Function Tests: No results for input(s): "TSH", "T4TOTAL", "FREET4", "T3FREE", "THYROIDAB" in the last 72 hours. Anemia Panel: No results for input(s): "VITAMINB12", "FOLATE", "FERRITIN", "TIBC", "IRON", "RETICCTPCT" in the last 72 hours. Sepsis Labs: No results for input(s): "PROCALCITON", "LATICACIDVEN" in the last 168 hours.  No results found for this or any previous visit (from the past 240 hours).       Radiology Studies: No results found.         LOS: 198 days   Time spent= 35 mins    Maggie Schooner, MD [facility]  If 7PM-7AM, please contact night-coverage  09/27/2023, 9:59 AM

## 2023-09-28 NOTE — Plan of Care (Signed)
  Problem: Safety: Goal: Non-violent Restraint(s) Outcome: Progressing   Problem: Clinical Measurements: Goal: Quality of life will improve Outcome: Progressing   Problem: Role Relationship: Goal: Family's ability to cope with current situation will improve Outcome: Progressing

## 2023-09-28 NOTE — Progress Notes (Signed)
 PROGRESS NOTE    Natalie Brown  OZH:[id number] DOB: 1965/06/30 DOA: 01/22/2023 PCP: Darol Elizabeth, NP    Brief Narrative:  58 y.o. F with bipolar disorder and early onset dementia who was admitted for failure to thrive .   This is a long and complicated hospitalization spanning 2 years.  For details, see Dr Pamila Boers note summary from 12/17/22, Dr. Mardella Shadow summary from 01/22/23, or Dr. Shearon Denis summary from 07/17/23.   In brief, she was admitted in 2023 for increasingly erratic behavior and cognitive impairment.  She was evaluated by Neurology and Psychiatry, but no treatable or reversible cause of her impairment could be found.  It appears she has an early onset dementia with comorbid bipolar disorder, leading to weight loss, malnutrition and failure to thrive .   For some reason, during that admission, there was a prolonged delay for over a year in placement in memory care until October 2024, and on arriving at that facility, she exhibited agitation behaviors, and so she was denied entry and sent back to the hospital the same day.     She remained in ER holding for 6 weeks until she was transferred upstairs in Nov 2024 and has been on [facility] since.  [name] nonverbal and does not follow commands. She can ambulate with assistance. [name] comfort care.   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   - On Ativan  0.5 mg TID, and ensure supplementations.  Continue assistance with meals.  Restraint order renewed.  Working closely with TOC on disposition planning.    Patient is DNR/DNI Comfort care Awaiting safe  disposition   Subjective:  Laying in the bed.  Does not respond to any questions  Examination: Laying in the bed does not respond to any questions. Does not appear to be in acute distress                Diet Orders (From admission, onward)     Start     Ordered   03/13/23 1330  Diet regular Room service appropriate? Yes; Fluid consistency: Thin  Diet effective now       Question Answer Comment  Room service appropriate? Yes   Fluid consistency: Thin      03/13/23 1330            Objective: Vitals:   09/24/23 0300 09/25/23 2109 09/26/23 1627 09/26/23 2137  BP: 106/66 (!) 91/58 109/72 120/73  Pulse: 94 66 79 (!) 102  Resp: 18 20 19 20   Temp: 98.6 F (37 C) 97.6 F (36.4 C) 97.6 F (36.4 C) 97.7 F (36.5 C)  TempSrc: Axillary  Oral   SpO2:   100%   Weight:      Height:        Intake/Output Summary (Last 24 hours) at 09/28/2023 1017 Last data filed at 09/27/2023 1824 Gross per 24 hour  Intake 591 ml  Output --  Net 591 ml   Filed Weights   06/02/23 0500 07/02/23 1700 07/17/23 1531  Weight: 42 kg 42.2 kg 37.4 kg    Scheduled Meds:  feeding supplement  237 mL Oral BID BM   LORazepam   0.5 mg Oral TID   Continuous Infusions:  Nutritional status Signs/Symptoms: severe fat depletion, severe muscle depletion, energy intake < or equal to 50% for > or equal to 5 days Interventions: Magic cup Body mass index is 13.32 kg/m.  Data Reviewed:   CBC: No results for input(s): "WBC", "NEUTROABS", "HGB", "HCT", "MCV", "PLT" in the last 168 hours. Basic Metabolic Panel: No results for input(s): "NA", "K", "CL", "CO2", "GLUCOSE", "BUN", "CREATININE", "CALCIUM ", "MG", "PHOS" in the last 168 hours. GFR: CrCl cannot be calculated (Patient's most recent lab result is older than the maximum 21 days allowed.). Liver Function Tests: No results for input(s): "AST", "ALT", "ALKPHOS", "BILITOT", "PROT", "ALBUMIN " in the last 168 hours. No results for input(s):  "LIPASE", "AMYLASE" in the last 168 hours. No results for input(s): "AMMONIA" in the last 168 hours. Coagulation Profile: No results for input(s): "INR", "PROTIME" in the last 168 hours. Cardiac Enzymes: No results for input(s): "CKTOTAL", "CKMB", "CKMBINDEX", "TROPONINI" in the last 168 hours. BNP (last 3 results) No results for input(s): "PROBNP" in the last 8760 hours. HbA1C: No results for input(s): "HGBA1C" in the last 72 hours. CBG: No results for input(s): "GLUCAP" in the last 168 hours. Lipid Profile: No results for input(s): "CHOL", "HDL", "LDLCALC", "TRIG", "CHOLHDL", "LDLDIRECT" in the last 72 hours. Thyroid Function Tests: No results for input(s): "TSH", "T4TOTAL", "FREET4", "T3FREE", "THYROIDAB" in the last 72 hours. Anemia Panel: No results for input(s): "VITAMINB12", "FOLATE", "FERRITIN", "TIBC", "IRON", "RETICCTPCT" in the last 72 hours. Sepsis Labs: No results for input(s): "PROCALCITON", "LATICACIDVEN" in the last 168 hours.  No results found for this or any previous visit (from the past 240 hours).       Radiology Studies: No results found.         LOS: 199 days   Time spent= 35 mins    Maggie Schooner, MD [facility]  If 7PM-7AM, please contact night-coverage  09/28/2023, 10:17 AM

## 2023-09-29 NOTE — Progress Notes (Signed)
 PROGRESS NOTE    Natalie Brown  ZOX:[id number] DOB: 08/17/1965 DOA: 01/22/2023 PCP: Darol Elizabeth, NP    Brief Narrative:  58 y.o. F with bipolar disorder and early onset dementia who was admitted for failure to thrive .   This is a long and complicated hospitalization spanning 2 years.  For details, see Dr Pamila Boers note summary from 12/17/22, Dr. Mardella Shadow summary from 01/22/23, or Dr. Shearon Denis summary from 07/17/23.   In brief, she was admitted in 2023 for increasingly erratic behavior and cognitive impairment.  She was evaluated by Neurology and Psychiatry, but no treatable or reversible cause of her impairment could be found.  It appears she has an early onset dementia with comorbid bipolar disorder, leading to weight loss, malnutrition and failure to thrive .   For some reason, during that admission, there was a prolonged delay for over a year in placement in memory care until October 2024, and on arriving at that facility, she exhibited agitation behaviors, and so she was denied entry and sent back to the hospital the same day.     She remained in ER holding for 6 weeks until she was transferred upstairs in Nov 2024 and has been on [facility] since.  [name] nonverbal and does not follow commands. She can ambulate with assistance. [name] comfort care.   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   - On Ativan  0.5 mg TID, and ensure supplementations.  Continue assistance with meals.  Restraint order renewed.  Working closely with TOC on disposition planning.    Patient is DNR/DNI Comfort care Awaiting safe  disposition   Subjective:  Laying in the bed.  Does not respond to any questions  Examination: Laying in the bed does not respond to any questions. Does not appear to be in acute distress                Diet Orders (From admission, onward)     Start     Ordered   03/13/23 1330  Diet regular Room service appropriate? Yes; Fluid consistency: Thin  Diet effective now       Question Answer Comment  Room service appropriate? Yes   Fluid consistency: Thin      03/13/23 1330            Objective: Vitals:   09/25/23 2109 09/26/23 1627 09/26/23 2137 09/28/23 2139  BP: (!) 91/58 109/72 120/73 92/62  Pulse: 66 79 (!) 102 100  Resp: 20 19 20 17   Temp: 97.6 F (36.4 C) 97.6 F (36.4 C) 97.7 F (36.5 C) 98.4 F (36.9 C)  TempSrc:  Oral    SpO2:  100%  98%  Weight:      Height:        Intake/Output Summary (Last 24 hours) at 09/29/2023 1152 Last data filed at 09/29/2023 0846 Gross per 24 hour  Intake 120 ml  Output --  Net 120 ml   Filed Weights   06/02/23 0500 07/02/23 1700 07/17/23 1531  Weight: 42 kg 42.2 kg 37.4 kg    Scheduled Meds:  feeding supplement  237 mL Oral BID BM   LORazepam   0.5 mg Oral TID   Continuous Infusions:  Nutritional status Signs/Symptoms: severe fat depletion, severe muscle depletion, energy intake < or equal to 50% for > or equal to 5 days Interventions: Magic cup Body mass index is 13.32 kg/m.  Data Reviewed:   CBC: No results for input(s): "WBC", "NEUTROABS", "HGB", "HCT", "MCV", "PLT" in the last 168 hours. Basic Metabolic Panel: No results for input(s): "NA", "K", "CL", "CO2", "GLUCOSE", "BUN", "CREATININE", "CALCIUM ", "MG", "PHOS" in the last 168 hours. GFR: CrCl cannot be calculated (Patient's most recent lab result is older than the maximum 21 days allowed.). Liver Function Tests: No results for input(s): "AST", "ALT", "ALKPHOS", "BILITOT", "PROT", "ALBUMIN " in the last 168 hours. No results for input(s):  "LIPASE", "AMYLASE" in the last 168 hours. No results for input(s): "AMMONIA" in the last 168 hours. Coagulation Profile: No results for input(s): "INR", "PROTIME" in the last 168 hours. Cardiac Enzymes: No results for input(s): "CKTOTAL", "CKMB", "CKMBINDEX", "TROPONINI" in the last 168 hours. BNP (last 3 results) No results for input(s): "PROBNP" in the last 8760 hours. HbA1C: No results for input(s): "HGBA1C" in the last 72 hours. CBG: No results for input(s): "GLUCAP" in the last 168 hours. Lipid Profile: No results for input(s): "CHOL", "HDL", "LDLCALC", "TRIG", "CHOLHDL", "LDLDIRECT" in the last 72 hours. Thyroid Function Tests: No results for input(s): "TSH", "T4TOTAL", "FREET4", "T3FREE", "THYROIDAB" in the last 72 hours. Anemia Panel: No results for input(s): "VITAMINB12", "FOLATE", "FERRITIN", "TIBC", "IRON", "RETICCTPCT" in the last 72 hours. Sepsis Labs: No results for input(s): "PROCALCITON", "LATICACIDVEN" in the last 168 hours.  No results found for this or any previous visit (from the past 240 hours).       Radiology Studies: No results found.         LOS: 200 days   Time spent= 35 mins    Maggie Schooner, MD [facility]  If 7PM-7AM, please contact night-coverage  09/29/2023, 11:52 AM

## 2023-09-29 NOTE — Plan of Care (Signed)
  Problem: Safety: Goal: Non-violent Restraint(s) Outcome: Progressing   Problem: Clinical Measurements: Goal: Quality of life will improve Outcome: Progressing   Problem: Role Relationship: Goal: Family's ability to cope with current situation will improve Outcome: Progressing

## 2023-09-30 NOTE — Plan of Care (Signed)
  Problem: Safety: Goal: Non-violent Restraint(s) Outcome: Progressing   Problem: Clinical Measurements: Goal: Quality of life will improve Outcome: Progressing   Problem: Role Relationship: Goal: Family's ability to cope with current situation will improve Outcome: Progressing

## 2023-09-30 NOTE — Plan of Care (Signed)
  Problem: Safety: Goal: Non-violent Restraint(s) Outcome: Not Progressing   Problem: Clinical Measurements: Goal: Quality of life will improve Outcome: Not Progressing   Problem: Role Relationship: Goal: Family's ability to cope with current situation will improve Outcome: Not Progressing

## 2023-09-30 NOTE — Progress Notes (Signed)
 PROGRESS NOTE    Natalie Brown  ZOX:[id number] DOB: [birth year] DOA: 01/22/2023 PCP: Darol Elizabeth, NP    Brief Narrative:  58 y.o. F with bipolar disorder and early onset dementia who was admitted for failure to thrive .   This is a long and complicated hospitalization spanning 2 years.  For details, see Dr Pamila Boers note summary from 12/17/22, Dr. Mardella Shadow summary from 01/22/23, or Dr. Shearon Denis summary from 07/17/23.   In brief, she was admitted in 2023 for increasingly erratic behavior and cognitive impairment.  She was evaluated by Neurology and Psychiatry, but no treatable or reversible cause of her impairment could be found.  It appears she has an early onset dementia with comorbid bipolar disorder, leading to weight loss, malnutrition and failure to thrive .   For some reason, during that admission, there was a prolonged delay for over a year in placement in memory care until October 2024, and on arriving at that facility, she exhibited agitation behaviors, and so she was denied entry and sent back to the hospital the same day.     She remained in ER holding for 6 weeks until she was transferred upstairs in Nov 2024 and has been on [facility] since.  [name] nonverbal and does not follow commands. She can ambulate with assistance. [name] comfort care.   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   - On Ativan  0.5 mg TID, and ensure supplementations.  Continue assistance with meals.  Restraint order renewed.  Working closely with TOC on disposition planning.    Patient is DNR/DNI Comfort care Awaiting safe  disposition   Subjective:  Laying in the bed.  Does not respond to any questions  Examination: Laying in the bed does not respond to any questions. Does not appear to be in acute distress                Diet Orders (From admission, onward)     Start     Ordered   03/13/23 1330  Diet regular Room service appropriate? Yes; Fluid consistency: Thin  Diet effective now       Question Answer Comment  Room service appropriate? Yes   Fluid consistency: Thin      03/13/23 1330            Objective: Vitals:   09/26/23 1627 09/26/23 2137 09/28/23 2139 09/30/23 0346  BP: 109/72 120/73 92/62 123/81  Pulse: 79 (!) 102 100 77  Resp: 19 20 17 16   Temp: 97.6 F (36.4 C) 97.7 F (36.5 C) 98.4 F (36.9 C) 97.7 F (36.5 C)  TempSrc: Oral     SpO2: 100%  98% 100%  Weight:      Height:        Intake/Output Summary (Last 24 hours) at 09/30/2023 1047 Last data filed at 09/29/2023 1250 Gross per 24 hour  Intake 240 ml  Output --  Net 240 ml   Filed Weights   06/02/23 0500 07/02/23 1700 07/17/23 1531  Weight: 42 kg 42.2 kg 37.4 kg    Scheduled Meds:  feeding supplement  237 mL Oral BID BM   LORazepam   0.5 mg Oral TID   Continuous Infusions:  Nutritional status Signs/Symptoms: severe fat depletion, severe muscle depletion, energy intake < or equal to 50% for > or equal to 5 days Interventions: Magic cup Body mass index is 13.32 kg/m.  Data Reviewed:   CBC: No results for input(s): "WBC", "NEUTROABS", "HGB", "HCT", "MCV", "PLT" in the last 168 hours. Basic Metabolic Panel: No results for input(s): "NA", "K", "CL", "CO2", "GLUCOSE", "BUN", "CREATININE", "CALCIUM ", "MG", "PHOS" in the last 168 hours. GFR: CrCl cannot be calculated (Patient's most recent lab result is older than the maximum 21 days allowed.). Liver Function Tests: No results for input(s): "AST", "ALT", "ALKPHOS", "BILITOT", "PROT", "ALBUMIN " in the last 168 hours. No results for input(s):  "LIPASE", "AMYLASE" in the last 168 hours. No results for input(s): "AMMONIA" in the last 168 hours. Coagulation Profile: No results for input(s): "INR", "PROTIME" in the last 168 hours. Cardiac Enzymes: No results for input(s): "CKTOTAL", "CKMB", "CKMBINDEX", "TROPONINI" in the last 168 hours. BNP (last 3 results) No results for input(s): "PROBNP" in the last 8760 hours. HbA1C: No results for input(s): "HGBA1C" in the last 72 hours. CBG: No results for input(s): "GLUCAP" in the last 168 hours. Lipid Profile: No results for input(s): "CHOL", "HDL", "LDLCALC", "TRIG", "CHOLHDL", "LDLDIRECT" in the last 72 hours. Thyroid Function Tests: No results for input(s): "TSH", "T4TOTAL", "FREET4", "T3FREE", "THYROIDAB" in the last 72 hours. Anemia Panel: No results for input(s): "VITAMINB12", "FOLATE", "FERRITIN", "TIBC", "IRON", "RETICCTPCT" in the last 72 hours. Sepsis Labs: No results for input(s): "PROCALCITON", "LATICACIDVEN" in the last 168 hours.  No results found for this or any previous visit (from the past 240 hours).       Radiology Studies: No results found.         LOS: 201 days   Time spent= 35 mins    Maggie Schooner, MD [facility]  If 7PM-7AM, please contact night-coverage  09/30/2023, 10:47 AM

## 2023-10-01 NOTE — Progress Notes (Signed)
 PROGRESS NOTE    Natalie Brown  WUJ:[id number] DOB: 1965-06-13 DOA: 01/22/2023 PCP: Natalie Elizabeth, NP    Brief Narrative:  58 y.o. F with bipolar disorder and early onset dementia who was admitted for failure to thrive .   This is Natalie Brown long and complicated hospitalization spanning 2 years.  For details, see Dr Natalie Brown note summary from 12/17/22, Dr. Mardella Brown summary from 01/22/23, or Dr. Shearon Brown summary from 06/22/23.   In brief, she was admitted in 2023 for increasingly erratic behavior and cognitive impairment.  She was evaluated by Neurology and Psychiatry, but no treatable or reversible cause of her impairment could be found.  It appears she has an early onset dementia with comorbid bipolar disorder, leading to weight loss, malnutrition and failure to thrive .   For some reason, during that admission, there was Natalie Brown prolonged delay for over Natalie Brown year in placement in memory care until October 2024, and on arriving at that facility, she exhibited agitation behaviors, and so she was denied entry and sent back to the hospital the same day.     She remained in ER holding for 6 weeks until she was transferred upstairs in Nov 2024 and has been on [facility] since.  [name] nonverbal and does not follow commands. She can ambulate with assistance.  [name] comfort care.   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  At that time they recommended management of behavioral symptoms per psych and supportive care with palliative conversations.  Bipolar disorder Severe protein calorie malnutrition   Chronic kidney disease stage IIIa  Hypernatremia   Comfort care.  - last palliative note I see from 12/8  - On Ativan  0.5 mg TID, and ensure supplementations.  Continue assistance with meals.  Restraint order renewed.  Working closely with TOC on disposition planning.   Patient is DNR/DNI Comfort care Awaiting safe disposition No family at bedside      Diet Orders (From admission, onward)     Start     Ordered   03/13/23 1330  Diet regular Room service appropriate? Yes; Fluid consistency: Thin  Diet effective now       Question Answer Comment  Room service appropriate? Yes   Fluid consistency: Thin      03/13/23 1330           Subjective:  Lying in bed, unresponsive   Objective: Vitals:   09/28/23 2139 09/30/23 0346 09/30/23 1400 10/01/23 0900  BP: 92/62 123/81 110/74 118/67  Pulse: 100 77 100 94  Resp: 17 16    Temp: 98.4 F (36.9 C) 97.7 F (36.5 C)    TempSrc:      SpO2: 98% 100%    Weight:      Height:       Examination:  General: lying in bed in fetal position  Cardiovascular: RRR Lungs: unlabored Neurological: awake, nonverbal - no meaningful interaction Extremities: No clubbing or cyanosis. No edema.    Intake/Output Summary (Last 24 hours) at 10/01/2023 1049 Last data filed at 10/01/2023 0503 Gross per 24 hour  Intake 120 ml  Output --  Net 120 ml  Filed Weights   06/02/23 0500 07/02/23 1700 07/17/23 1531  Weight: 42 kg 42.2 kg 37.4 kg    Scheduled Meds:  feeding supplement  237 mL Oral BID BM   LORazepam   0.5 mg Oral TID   Continuous Infusions:  Nutritional status Signs/Symptoms: severe fat depletion, severe muscle depletion, energy intake < or equal to 50% for > or equal to 5 days Interventions: Magic cup Body mass index is 13.32 kg/m.  Data Reviewed:   CBC: No results for input(s): WBC, NEUTROABS, HGB, HCT, MCV, PLT in the last 168 hours. Basic Metabolic Panel: No results for input(s): NA, K, CL,  CO2, GLUCOSE, BUN, CREATININE, CALCIUM , MG, PHOS in the last 168 hours. GFR: CrCl cannot be calculated (Patient's most recent lab result is older than the maximum 21 days allowed.). Liver Function Tests: No results for input(s): AST, ALT, ALKPHOS, BILITOT, PROT, ALBUMIN  in the last 168 hours. No results for input(s): LIPASE, AMYLASE in the last 168 hours. No results for input(s): AMMONIA in the last 168 hours. Coagulation Profile: No results for input(s): INR, PROTIME in the last 168 hours. Cardiac Enzymes: No results for input(s): CKTOTAL, CKMB, CKMBINDEX, TROPONINI in the last 168 hours. BNP (last 3 results) No results for input(s): PROBNP in the last 8760 hours. HbA1C: No results for input(s): HGBA1C in the last 72 hours. CBG: No results for input(s): GLUCAP in the last 168 hours. Lipid Profile: No results for input(s): CHOL, HDL, LDLCALC, TRIG, CHOLHDL, LDLDIRECT in the last 72 hours. Thyroid Function Tests: No results for input(s): TSH, T4TOTAL, FREET4, T3FREE, THYROIDAB in the last 72 hours. Anemia Panel: No results for input(s): VITAMINB12, FOLATE, FERRITIN, TIBC, IRON, RETICCTPCT in the last 72 hours. Sepsis Labs: No results for input(s): PROCALCITON, LATICACIDVEN in the last 168 hours.  No results found for this or any previous visit (from the past 240 hours).       Radiology Studies: No results found.         LOS: 202 days   Time spent=    Natalie Gains, MD [facility]  If 7PM-7AM, please contact night-coverage  10/01/2023, 10:49 AM

## 2023-10-01 NOTE — Plan of Care (Signed)
  Problem: Safety: Goal: Non-violent Restraint(s) Outcome: Progressing   Problem: Clinical Measurements: Goal: Quality of life will improve Outcome: Progressing   Problem: Role Relationship: Goal: Family's ability to cope with current situation will improve Outcome: Progressing

## 2023-10-02 NOTE — Plan of Care (Signed)
  Problem: Safety: Goal: Non-violent Restraint(s) Outcome: Progressing   Problem: Clinical Measurements: Goal: Quality of life will improve Outcome: Progressing   Problem: Role Relationship: Goal: Family's ability to cope with current situation will improve Outcome: Progressing

## 2023-10-02 NOTE — Plan of Care (Signed)
  Problem: Safety: Goal: Non-violent Restraint(s) Outcome: Progressing   Problem: Role Relationship: Goal: Family's ability to cope with current situation will improve Outcome: Progressing

## 2023-10-02 NOTE — Progress Notes (Signed)
 PROGRESS NOTE    Natalie Brown  ZOX:[id number] DOB: [birth year] DOA: 01/22/2023 PCP: Darol Elizabeth, NP    Brief Narrative:  59 y.o. F with bipolar disorder and early onset dementia who was admitted for failure to thrive .   This is Natalie Brown long and complicated hospitalization spanning 2 years.  For details, see Dr Pamila Boers note summary from 12/17/22, Dr. Mardella Shadow summary from 01/22/23, or Dr. Shearon Denis summary from 06/22/23.   In brief, she was admitted in 2023 for increasingly erratic behavior and cognitive impairment.  She was evaluated by Neurology and Psychiatry, but no treatable or reversible cause of her impairment could be found.  It appears she has an early onset dementia with comorbid bipolar disorder, leading to weight loss, malnutrition and failure to thrive .   For some reason, during that admission, there was Natalie Brown prolonged delay for over Natalie Brown year in placement in memory care until October 2024, and on arriving at that facility, she exhibited agitation behaviors, and so she was denied entry and sent back to the hospital the same day.     She remained in ER holding for 6 weeks until she was transferred upstairs in Nov 2024 and has been on [facility] since.  [name] nonverbal and does not follow commands. She can ambulate with assistance.  [name] comfort care.   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  At that time they recommended management of behavioral symptoms per psych and supportive care with palliative conversations.  Bipolar disorder Severe protein calorie malnutrition   Chronic kidney disease stage IIIa  Hypernatremia   Comfort care.  - last palliative note I see from 12/8  - On Ativan  0.5 mg TID, and ensure supplementations.  Continue assistance with meals.  Restraint order renewed.  Working closely with TOC on disposition planning.   Patient is DNR/DNI Comfort care Awaiting safe disposition No family at bedside      Diet Orders (From admission, onward)     Start     Ordered   03/13/23 1330  Diet regular Room service appropriate? Yes; Fluid consistency: Thin  Diet effective now       Question Answer Comment  Room service appropriate? Yes   Fluid consistency: Thin      03/13/23 1330           Subjective:  Lying in bed, nonverbal   Objective: Vitals:   09/30/23 0346 09/30/23 1400 10/01/23 0900 10/02/23 1228  BP: 123/81 110/74 118/67 111/67  Pulse: 77 100 94 (!) 104  Resp: 16   19  Temp: 97.7 F (36.5 C)   97.9 F (36.6 C)  TempSrc:      SpO2: 100%   100%  Weight:      Height:       Examination:  General: lying in bed in fetal position Lungs: unlabored Neurological: nonverbal, no meaningful interaction Extremities: no edema   Intake/Output Summary (Last 24 hours) at 10/02/2023 1445 Last data filed at 10/02/2023 1325 Gross per 24 hour  Intake 960 ml  Output --  Net 960 ml   Filed Weights   06/02/23 0500 07/02/23 1700 07/17/23  1531  Weight: 42 kg 42.2 kg 37.4 kg    Scheduled Meds:  feeding supplement  237 mL Oral BID BM   LORazepam   0.5 mg Oral TID   Continuous Infusions:  Nutritional status Signs/Symptoms: severe fat depletion, severe muscle depletion, energy intake < or equal to 50% for > or equal to 5 days Interventions: Magic cup Body mass index is 13.32 kg/m.  Data Reviewed:   CBC: No results for input(s): WBC, NEUTROABS, HGB, HCT, MCV, PLT in the last 168 hours. Basic Metabolic Panel: No results for input(s): NA, K, CL, CO2, GLUCOSE, BUN, CREATININE, CALCIUM , MG,  PHOS in the last 168 hours. GFR: CrCl cannot be calculated (Patient's most recent lab result is older than the maximum 21 days allowed.). Liver Function Tests: No results for input(s): AST, ALT, ALKPHOS, BILITOT, PROT, ALBUMIN  in the last 168 hours. No results for input(s): LIPASE, AMYLASE in the last 168 hours. No results for input(s): AMMONIA in the last 168 hours. Coagulation Profile: No results for input(s): INR, PROTIME in the last 168 hours. Cardiac Enzymes: No results for input(s): CKTOTAL, CKMB, CKMBINDEX, TROPONINI in the last 168 hours. BNP (last 3 results) No results for input(s): PROBNP in the last 8760 hours. HbA1C: No results for input(s): HGBA1C in the last 72 hours. CBG: No results for input(s): GLUCAP in the last 168 hours. Lipid Profile: No results for input(s): CHOL, HDL, LDLCALC, TRIG, CHOLHDL, LDLDIRECT in the last 72 hours. Thyroid Function Tests: No results for input(s): TSH, T4TOTAL, FREET4, T3FREE, THYROIDAB in the last 72 hours. Anemia Panel: No results for input(s): VITAMINB12, FOLATE, FERRITIN, TIBC, IRON, RETICCTPCT in the last 72 hours. Sepsis Labs: No results for input(s): PROCALCITON, LATICACIDVEN in the last 168 hours.  No results found for this or any previous visit (from the past 240 hours).       Radiology Studies: No results found.         LOS: 203 days   Time spent=    Donnetta Gains, MD [facility]  If 7PM-7AM, please contact night-coverage  10/02/2023, 2:45 PM

## 2023-10-03 NOTE — Progress Notes (Signed)
 PROGRESS NOTE    Natalie Brown  ZOX:[id number] DOB: 29-Dec-1965 DOA: 01/22/2023 PCP: Darol Elizabeth, NP    Brief Narrative:  58 y.o. F with bipolar disorder and early onset dementia who was admitted for failure to thrive .   This is Natalie Brown long and complicated hospitalization spanning 2 years.  For details, see Dr Pamila Boers note summary from 12/17/22, Dr. Mardella Shadow summary from 01/22/23, or Dr. Shearon Denis summary from 06/22/23.   In brief, she was admitted in 2023 for increasingly erratic behavior and cognitive impairment.  She was evaluated by Neurology and Psychiatry, but no treatable or reversible cause of her impairment could be found.  It appears she has an early onset dementia with comorbid bipolar disorder, leading to weight loss, malnutrition and failure to thrive .   For some reason, during that admission, there was Natalie Brown prolonged delay for over Natalie Brown year in placement in memory care until October 2024, and on arriving at that facility, she exhibited agitation behaviors, and so she was denied entry and sent back to the hospital the same day.     She remained in ER holding for 6 weeks until she was transferred upstairs in Nov 2024 and has been on [facility] since.  [name] nonverbal and does not follow commands. She can ambulate with assistance.  [name] comfort care.   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  At that time they recommended management of behavioral symptoms per psych and supportive care with palliative conversations.  Bipolar disorder Severe protein calorie malnutrition   Chronic kidney disease stage IIIa  Hypernatremia   Comfort care.  - last palliative note I see from 12/8  - On Ativan  0.5 mg TID, and ensure supplementations.  Continue assistance with meals.  Restraint order renewed.  Working closely with TOC on disposition planning.   Patient is DNR/DNI Comfort care Awaiting safe disposition No family at bedside      Diet Orders (From admission, onward)     Start     Ordered   03/13/23 1330  Diet regular Room service appropriate? Yes; Fluid consistency: Thin  Diet effective now       Question Answer Comment  Room service appropriate? Yes   Fluid consistency: Thin      03/13/23 1330           Subjective:  Nonverbal    Objective: Vitals:   09/30/23 1400 10/01/23 0900 10/02/23 1228 10/03/23 0733  BP: 110/74 118/67 111/67 119/60  Pulse: 100 94 (!) 104 85  Resp:   19   Temp:   97.9 F (36.6 C) 97.9 F (36.6 C)  TempSrc:    Axillary  SpO2:   100% 98%  Weight:      Height:       Examination:  General: No acute distress.  Lying in bed in fetal position.  Lungs: unlabored Neurological: nonverbal, no meaningful communication Extremities: No clubbing or cyanosis. No edema.    Intake/Output Summary (Last 24 hours) at 10/03/2023 1435 Last data filed at 10/03/2023 1157 Gross per 24 hour  Intake 480 ml  Output --  Net 480 ml   Natalie Brown  Weights   06/02/23 0500 07/02/23 1700 07/17/23 1531  Weight: 42 kg 42.2 kg 37.4 kg    Scheduled Meds:  feeding supplement  237 mL Oral BID BM   LORazepam   0.5 mg Oral TID   Continuous Infusions:  Nutritional status Signs/Symptoms: severe fat depletion, severe muscle depletion, energy intake < or equal to 50% for > or equal to 5 days Interventions: Magic cup Body mass index is 13.32 kg/m.  Data Reviewed:   CBC: No results for input(s): WBC, NEUTROABS, HGB, HCT, MCV, PLT in the last 168 hours. Basic Metabolic Panel: No results for input(s): NA, K, CL, CO2,  GLUCOSE, BUN, CREATININE, CALCIUM , MG, PHOS in the last 168 hours. GFR: CrCl cannot be calculated (Patient's most recent lab result is older than the maximum 21 days allowed.). Liver Function Tests: No results for input(s): AST, ALT, ALKPHOS, BILITOT, PROT, ALBUMIN  in the last 168 hours. No results for input(s): LIPASE, AMYLASE in the last 168 hours. No results for input(s): AMMONIA in the last 168 hours. Coagulation Profile: No results for input(s): INR, PROTIME in the last 168 hours. Cardiac Enzymes: No results for input(s): CKTOTAL, CKMB, CKMBINDEX, TROPONINI in the last 168 hours. BNP (last 3 results) No results for input(s): PROBNP in the last 8760 hours. HbA1C: No results for input(s): HGBA1C in the last 72 hours. CBG: No results for input(s): GLUCAP in the last 168 hours. Lipid Profile: No results for input(s): CHOL, HDL, LDLCALC, TRIG, CHOLHDL, LDLDIRECT in the last 72 hours. Thyroid Function Tests: No results for input(s): TSH, T4TOTAL, FREET4, T3FREE, THYROIDAB in the last 72 hours. Anemia Panel: No results for input(s): VITAMINB12, FOLATE, FERRITIN, TIBC, IRON, RETICCTPCT in the last 72 hours. Sepsis Labs: No results for input(s): PROCALCITON, LATICACIDVEN in the last 168 hours.  No results found for this or any previous visit (from the past 240 hours).       Radiology Studies: No results found.         LOS: 204 days   Time spent=    Donnetta Gains, MD [facility]  If 7PM-7AM, please contact night-coverage  10/03/2023, 2:35 PM

## 2023-10-03 NOTE — Plan of Care (Signed)
  Problem: Safety: Goal: Non-violent Restraint(s) Outcome: Progressing   Problem: Clinical Measurements: Goal: Quality of life will improve Outcome: Progressing   Problem: Role Relationship: Goal: Family's ability to cope with current situation will improve Outcome: Progressing

## 2023-10-04 NOTE — Progress Notes (Signed)
 PROGRESS NOTE    Natalie Brown  YQM:[id number] DOB: 10/10/1965 DOA: 01/22/2023 PCP: Darol Elizabeth, NP    Brief Narrative:  58 y.o. F with bipolar disorder and early onset dementia who was admitted for failure to thrive .   This is Natalie Brown long and complicated hospitalization spanning 2 years.  For details, see Dr Pamila Boers note summary from 12/17/22, Dr. Mardella Shadow summary from 01/22/23, or Dr. Shearon Denis summary from 06/22/23.   In brief, she was admitted in 2023 for increasingly erratic behavior and cognitive impairment.  She was evaluated by Neurology and Psychiatry, but no treatable or reversible cause of her impairment could be found.  It appears she has an early onset dementia with comorbid bipolar disorder, leading to weight loss, malnutrition and failure to thrive .   For some reason, during that admission, there was Deonne Rooks prolonged delay for over Myldred Raju year in placement in memory care until October 2024, and on arriving at that facility, she exhibited agitation behaviors, and so she was denied entry and sent back to the hospital the same day.     She remained in ER holding for 6 weeks until she was transferred upstairs in Nov 2024 and has been on [facility] since.  [name] nonverbal and does not follow commands. She can ambulate with assistance.  [name] comfort care.   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  At that time they recommended management of behavioral symptoms per psych and supportive care with palliative conversations.  Bipolar disorder Severe protein calorie malnutrition   Chronic kidney disease stage IIIa  Hypernatremia   Comfort care.  - last palliative note I see from 12/8  - On Ativan  0.5 mg TID, and ensure supplementations.  Continue assistance with meals.  Restraint order renewed.  Working closely with TOC on disposition planning.   Patient is DNR/DNI Comfort care Awaiting safe disposition No family at bedside      Diet Orders (From admission, onward)     Start     Ordered   03/13/23 1330  Diet regular Room service appropriate? Yes; Fluid consistency: Thin  Diet effective now       Question Answer Comment  Room service appropriate? Yes   Fluid consistency: Thin      03/13/23 1330           Subjective:  Nonverbal    Objective: Vitals:   09/30/23 1400 10/01/23 0900 10/02/23 1228 10/03/23 0733  BP: 110/74 118/67 111/67 119/60  Pulse: 100 94 (!) 104 85  Resp:   19   Temp:   97.9 F (36.6 C) 97.9 F (36.6 C)  TempSrc:    Axillary  SpO2:   100% 98%  Weight:      Height:       Examination:  General: NAD, lying in bed in fetal position   Lungs: unlabored Neurological: no meaningful communication Extremities: no edema   Intake/Output Summary (Last 24 hours) at 10/04/2023 1458 Last data filed at 10/04/2023 1217 Gross per 24 hour  Intake 120 ml  Output --  Net 120 ml   Filed Weights   06/02/23 0500 07/02/23 1700 07/17/23  1531  Weight: 42 kg 42.2 kg 37.4 kg    Scheduled Meds:  feeding supplement  237 mL Oral BID BM   LORazepam   0.5 mg Oral TID   Continuous Infusions:  Nutritional status Signs/Symptoms: severe fat depletion, severe muscle depletion, energy intake < or equal to 50% for > or equal to 5 days Interventions: Magic cup Body mass index is 13.32 kg/m.  Data Reviewed:   CBC: No results for input(s): WBC, NEUTROABS, HGB, HCT, MCV, PLT in the last 168 hours. Basic Metabolic Panel: No results for input(s): NA, K, CL, CO2, GLUCOSE, BUN, CREATININE, CALCIUM , MG, PHOS in  the last 168 hours. GFR: CrCl cannot be calculated (Patient's most recent lab result is older than the maximum 21 days allowed.). Liver Function Tests: No results for input(s): AST, ALT, ALKPHOS, BILITOT, PROT, ALBUMIN  in the last 168 hours. No results for input(s): LIPASE, AMYLASE in the last 168 hours. No results for input(s): AMMONIA in the last 168 hours. Coagulation Profile: No results for input(s): INR, PROTIME in the last 168 hours. Cardiac Enzymes: No results for input(s): CKTOTAL, CKMB, CKMBINDEX, TROPONINI in the last 168 hours. BNP (last 3 results) No results for input(s): PROBNP in the last 8760 hours. HbA1C: No results for input(s): HGBA1C in the last 72 hours. CBG: No results for input(s): GLUCAP in the last 168 hours. Lipid Profile: No results for input(s): CHOL, HDL, LDLCALC, TRIG, CHOLHDL, LDLDIRECT in the last 72 hours. Thyroid Function Tests: No results for input(s): TSH, T4TOTAL, FREET4, T3FREE, THYROIDAB in the last 72 hours. Anemia Panel: No results for input(s): VITAMINB12, FOLATE, FERRITIN, TIBC, IRON, RETICCTPCT in the last 72 hours. Sepsis Labs: No results for input(s): PROCALCITON, LATICACIDVEN in the last 168 hours.  No results found for this or any previous visit (from the past 240 hours).       Radiology Studies: No results found.         LOS: 205 days   Time spent=    Donnetta Gains, MD [facility]  If 7PM-7AM, please contact night-coverage  10/04/2023, 2:58 PM

## 2023-10-04 NOTE — Plan of Care (Signed)
  Problem: Safety: Goal: Non-violent Restraint(s) Outcome: Progressing   Problem: Clinical Measurements: Goal: Quality of life will improve Outcome: Progressing   Problem: Role Relationship: Goal: Family's ability to cope with current situation will improve Outcome: Progressing

## 2023-10-05 NOTE — Plan of Care (Signed)
  Problem: Safety: Goal: Non-violent Restraint(s) Outcome: Progressing   Problem: Clinical Measurements: Goal: Quality of life will improve Outcome: Progressing   Problem: Role Relationship: Goal: Family's ability to cope with current situation will improve Outcome: Progressing

## 2023-10-05 NOTE — Plan of Care (Signed)
  Problem: Safety: Goal: Non-violent Restraint(s) 10/05/2023 0446 by Nevea Spiewak, Dennis Fitting, RN Outcome: Progressing 10/05/2023 0445 by Janthony Holleman, Dennis Fitting, RN Outcome: Progressing   Problem: Clinical Measurements: Goal: Quality of life will improve 10/05/2023 0446 by Danaija Eskridge, Dennis Fitting, RN Outcome: Progressing 10/05/2023 0445 by Candis Kabel, Dennis Fitting, RN Outcome: Progressing   Problem: Role Relationship: Goal: Family's ability to cope with current situation will improve 10/05/2023 0446 by Shadiamond Koska, Dennis Fitting, RN Outcome: Progressing 10/05/2023 0445 by Fidel Caggiano, Dennis Fitting, RN Outcome: Progressing

## 2023-10-05 NOTE — Progress Notes (Signed)
 PROGRESS NOTE    Natalie Brown  ZOX:[id number] DOB: 26-May-1965 DOA: 01/22/2023 PCP: Darol Elizabeth, NP    Brief Narrative:  58 y.o. F with bipolar disorder and early onset dementia who was admitted for failure to thrive .   This is Natalie Brown long and complicated hospitalization spanning 2 years.  For details, see Dr Pamila Boers note summary from 12/17/22, Dr. Mardella Shadow summary from 01/22/23, or Dr. Shearon Denis summary from 06/22/23.   In brief, she was admitted in 2023 for increasingly erratic behavior and cognitive impairment.  She was evaluated by Neurology and Psychiatry, but no treatable or reversible cause of her impairment could be found.  It appears she has an early onset dementia with comorbid bipolar disorder, leading to weight loss, malnutrition and failure to thrive .   For some reason, during that admission, there was Natalie Brown prolonged delay for over Natalie Brown year in placement in memory care until October 2024, and on arriving at that facility, she exhibited agitation behaviors, and so she was denied entry and sent back to the hospital the same day.     She remained in ER holding for 6 weeks until she was transferred upstairs in Nov 2024 and has been on [facility] since.  [name] nonverbal and does not follow commands. She can ambulate with assistance.  [name] comfort care.   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  At that time they recommended management of behavioral symptoms per psych and supportive care with palliative conversations.  Bipolar disorder Severe protein calorie malnutrition   Chronic kidney disease stage IIIa  Hypernatremia   Comfort care.  - last palliative note I see from 12/8  - On Ativan  0.5 mg TID, and ensure supplementations.  Continue assistance with meals.  Restraint order renewed.  Working closely with TOC on disposition planning.   Patient is DNR/DNI Comfort care Awaiting safe disposition No family at bedside      Diet Orders (From admission, onward)     Start     Ordered   03/13/23 1330  Diet regular Room service appropriate? Yes; Fluid consistency: Thin  Diet effective now       Question Answer Comment  Room service appropriate? Yes   Fluid consistency: Thin      03/13/23 1330           Subjective:  Nonverbal     Objective: Vitals:   10/01/23 0900 10/02/23 1228 10/03/23 0733 10/04/23 2020  BP: 118/67 111/67 119/60 115/64  Pulse: 94 (!) 104 85 67  Resp:  19  18  Temp:  97.9 F (36.6 C) 97.9 F (36.6 C) 98.6 F (37 C)  TempSrc:   Axillary Oral  SpO2:  100% 98% 100%  Weight:      Height:       Examination:  General: lying in fetal position Lungs: unlabored Neurological: nonverbal  Extremities: No clubbing or cyanosis. No edema.   Intake/Output Summary (Last 24 hours) at 10/05/2023 1547 Last data filed at 10/05/2023 0655 Gross per 24 hour  Intake 480 ml  Output --  Net 480 ml   Filed Weights   06/02/23 0500 07/02/23  1700 07/17/23 1531  Weight: 42 kg 42.2 kg 37.4 kg    Scheduled Meds:  feeding supplement  237 mL Oral BID BM   LORazepam   0.5 mg Oral TID   Continuous Infusions:  Nutritional status Signs/Symptoms: severe fat depletion, severe muscle depletion, energy intake < or equal to 50% for > or equal to 5 days Interventions: Magic cup Body mass index is 13.32 kg/m.  Data Reviewed:   CBC: No results for input(s): WBC, NEUTROABS, HGB, HCT, MCV, PLT in the last 168 hours. Basic Metabolic Panel: No results for input(s): NA, K, CL, CO2, GLUCOSE, BUN, CREATININE,  CALCIUM , MG, PHOS in the last 168 hours. GFR: CrCl cannot be calculated (Patient's most recent lab result is older than the maximum 21 days allowed.). Liver Function Tests: No results for input(s): AST, ALT, ALKPHOS, BILITOT, PROT, ALBUMIN  in the last 168 hours. No results for input(s): LIPASE, AMYLASE in the last 168 hours. No results for input(s): AMMONIA in the last 168 hours. Coagulation Profile: No results for input(s): INR, PROTIME in the last 168 hours. Cardiac Enzymes: No results for input(s): CKTOTAL, CKMB, CKMBINDEX, TROPONINI in the last 168 hours. BNP (last 3 results) No results for input(s): PROBNP in the last 8760 hours. HbA1C: No results for input(s): HGBA1C in the last 72 hours. CBG: No results for input(s): GLUCAP in the last 168 hours. Lipid Profile: No results for input(s): CHOL, HDL, LDLCALC, TRIG, CHOLHDL, LDLDIRECT in the last 72 hours. Thyroid Function Tests: No results for input(s): TSH, T4TOTAL, FREET4, T3FREE, THYROIDAB in the last 72 hours. Anemia Panel: No results for input(s): VITAMINB12, FOLATE, FERRITIN, TIBC, IRON, RETICCTPCT in the last 72 hours. Sepsis Labs: No results for input(s): PROCALCITON, LATICACIDVEN in the last 168 hours.  No results found for this or any previous visit (from the past 240 hours).       Radiology Studies: No results found.         LOS: 206 days   Time spent=    Donnetta Gains, MD [facility]  If 7PM-7AM, please contact night-coverage  10/05/2023, 3:47 PM

## 2023-10-06 NOTE — Plan of Care (Signed)
  Problem: Safety: Goal: Non-violent Restraint(s) Outcome: Progressing   Problem: Clinical Measurements: Goal: Quality of life will improve Outcome: Progressing   Problem: Role Relationship: Goal: Family's ability to cope with current situation will improve Outcome: Progressing

## 2023-10-06 NOTE — Progress Notes (Signed)
 PROGRESS NOTE    Natalie Brown  ZOX:[id number] DOB: 02-18-1966 DOA: 01/22/2023 PCP: Darol Elizabeth, NP    Brief Narrative:  58 y.o. F with bipolar disorder and early onset dementia who was admitted for failure to thrive .   This is Natalie Brown long and complicated hospitalization spanning 2 years.  For details, see Dr Pamila Boers note summary from 12/17/22, Dr. Mardella Shadow summary from 01/22/23, or Dr. Shearon Denis summary from 06/22/23.   In brief, she was admitted in 2023 for increasingly erratic behavior and cognitive impairment.  She was evaluated by Neurology and Psychiatry, but no treatable or reversible cause of her impairment could be found.  It appears she has an early onset dementia with comorbid bipolar disorder, leading to weight loss, malnutrition and failure to thrive .   For some reason, during that admission, there was Natalie Brown prolonged delay for over Natalie Brown year in placement in memory care until October 2024, and on arriving at that facility, she exhibited agitation behaviors, and so she was denied entry and sent back to the hospital the same day.     She remained in ER holding for 6 weeks until she was transferred upstairs in Nov 2024 and has been on [facility] since.  [name] nonverbal and does not follow commands. She can ambulate with assistance.  [name] comfort care.   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  At that time they recommended management of behavioral symptoms per psych and supportive care with palliative conversations.  Bipolar disorder Severe protein calorie malnutrition   Chronic kidney disease stage IIIa  Hypernatremia   Comfort care.  - last palliative note I see from 12/8  - On Ativan  0.5 mg TID, and ensure supplementations.  Continue assistance with meals.  Restraint order renewed.  Working closely with TOC on disposition planning.   Patient is DNR/DNI Comfort care Awaiting safe disposition No family at bedside      Diet Orders (From admission, onward)     Start     Ordered   03/13/23 1330  Diet regular Room service appropriate? Yes; Fluid consistency: Thin  Diet effective now       Question Answer Comment  Room service appropriate? Yes   Fluid consistency: Thin      03/13/23 1330           Subjective:  Nonverbal    Objective: Vitals:   10/03/23 0733 10/04/23 2020 10/05/23 1600 10/05/23 2009  BP: 119/60 115/64 111/65 109/70  Pulse: 85 67 91 94  Resp:  18  18  Temp: 97.9 F (36.6 C) 98.6 F (37 C)  98.2 F (36.8 C)  TempSrc: Axillary Oral    SpO2: 98% 100%  98%  Weight:      Height:       Examination:  General: No acute distress.  Moving around in bed.  Cardiovascular: she didn't allow me to auscultate or check pulses Lungs: unlabored Neurological: nonverbal, moving all extremities Extremities: no visible edema    No intake or output data in the 24 hours ending 10/06/23 0805  Filed Weights   06/02/23 0500 07/02/23 1700 07/17/23 1531  Weight:  42 kg 42.2 kg 37.4 kg    Scheduled Meds:  feeding supplement  237 mL Oral BID BM   LORazepam   0.5 mg Oral TID   Continuous Infusions:  Nutritional status Signs/Symptoms: severe fat depletion, severe muscle depletion, energy intake < or equal to 50% for > or equal to 5 days Interventions: Magic cup Body mass index is 13.32 kg/m.  Data Reviewed:   CBC: No results for input(s): WBC, NEUTROABS, HGB, HCT, MCV, PLT in the last 168 hours. Basic Metabolic Panel: No results for input(s): NA, K, CL, CO2, GLUCOSE, BUN, CREATININE, CALCIUM ,  MG, PHOS in the last 168 hours. GFR: CrCl cannot be calculated (Patient's most recent lab result is older than the maximum 21 days allowed.). Liver Function Tests: No results for input(s): AST, ALT, ALKPHOS, BILITOT, PROT, ALBUMIN  in the last 168 hours. No results for input(s): LIPASE, AMYLASE in the last 168 hours. No results for input(s): AMMONIA in the last 168 hours. Coagulation Profile: No results for input(s): INR, PROTIME in the last 168 hours. Cardiac Enzymes: No results for input(s): CKTOTAL, CKMB, CKMBINDEX, TROPONINI in the last 168 hours. BNP (last 3 results) No results for input(s): PROBNP in the last 8760 hours. HbA1C: No results for input(s): HGBA1C in the last 72 hours. CBG: No results for input(s): GLUCAP in the last 168 hours. Lipid Profile: No results for input(s): CHOL, HDL, LDLCALC, TRIG, CHOLHDL, LDLDIRECT in the last 72 hours. Thyroid Function Tests: No results for input(s): TSH, T4TOTAL, FREET4, T3FREE, THYROIDAB in the last 72 hours. Anemia Panel: No results for input(s): VITAMINB12, FOLATE, FERRITIN, TIBC, IRON, RETICCTPCT in the last 72 hours. Sepsis Labs: No results for input(s): PROCALCITON, LATICACIDVEN in the last 168 hours.  No results found for this or any previous visit (from the past 240 hours).       Radiology Studies: No results found.         LOS: 207 days   Time spent=    Natalie Gains, MD [facility]  If 7PM-7AM, please contact night-coverage  10/06/2023, 8:05 AM

## 2023-10-06 NOTE — Plan of Care (Signed)
  Problem: Safety: Goal: Non-violent Restraint(s) 10/06/2023 0432 by Gabrial Domine, Dennis Fitting, RN Outcome: Progressing 10/06/2023 0431 by Autumn Pruitt, Dennis Fitting, RN Outcome: Progressing   Problem: Clinical Measurements: Goal: Quality of life will improve 10/06/2023 0432 by Lavena Loretto, Dennis Fitting, RN Outcome: Progressing 10/06/2023 0431 by Keandre Linden, Dennis Fitting, RN Outcome: Progressing   Problem: Role Relationship: Goal: Family's ability to cope with current situation will improve 10/06/2023 0432 by Destin Vinsant, Dennis Fitting, RN Outcome: Progressing 10/06/2023 0431 by Latrell Reitan, Dennis Fitting, RN Outcome: Progressing

## 2023-10-07 NOTE — Progress Notes (Addendum)
 PROGRESS NOTE    Natalie Brown  ZYS:[id number] DOB: 06-24-1965 DOA: 01/22/2023 PCP: Natalie Elizabeth, NP    Brief Narrative:  58 y.o. F with bipolar disorder and early onset dementia who was admitted for failure to thrive .   This is Natalie Brown long and complicated hospitalization spanning 2 years.  For details, see Dr Pamila Boers note summary from 12/17/22, Dr. Mardella Shadow summary from 01/22/23, or Dr. Shearon Denis summary from 06/22/23.   In brief, she was admitted in 2023 for increasingly erratic behavior and cognitive impairment.  She was evaluated by Neurology and Psychiatry, but no treatable or reversible cause of her impairment could be found.  It appears she has an early onset dementia with comorbid bipolar disorder, leading to weight loss, malnutrition and failure to thrive .   For some reason, during that admission, there was Natalie Brown prolonged delay for over Natalie Brown year in placement in memory care until October 2024, and on arriving at that facility, she exhibited agitation behaviors, and so she was denied entry and sent back to the hospital the same day.     She remained in ER holding for 6 weeks until she was transferred upstairs in Nov 2024 and has been on [facility] since.  [name] nonverbal and does not follow commands. She can ambulate with assistance.  [name] comfort care.   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  At that time they recommended management of behavioral symptoms per psych and supportive care with palliative conversations.  Bipolar disorder Severe protein calorie malnutrition   Chronic kidney disease stage IIIa  Hypernatremia   Comfort care.  - last palliative note I see from 12/8  - On Ativan  0.5 mg TID, and ensure supplementations.  Continue assistance with meals.  Restraint order renewed.  Working closely with TOC on disposition planning.   Her mother visits most days.  But dealing with radiation/chemo, so hasn't been able to come some days.    Patient is DNR/DNI Comfort care Awaiting safe disposition No family at bedside - discussed with mother 6/17 - she acknowledged goal for comfort.  Asking about her sleepiness (will review with RN, but from my experience when enclosure opened, she's Natalie Brown bit more active.        Diet Orders (From admission, onward)     Start     Ordered   03/13/23 1330  Diet regular Room service appropriate? Yes; Fluid consistency: Thin  Diet effective now       Question Answer Comment  Room service appropriate? Yes   Fluid consistency: Thin      03/13/23 1330           Subjective:  nonverbal   Objective: Vitals:   10/03/23 0733 10/04/23 2020 10/05/23 1600 10/05/23 2009  BP: 119/60 115/64 111/65 109/70  Pulse: 85 67 91 94  Resp:  18  18  Temp: 97.9 F (36.6 C) 98.6 F (37 C)  98.2 F (36.8 C)  TempSrc: Axillary Oral    SpO2: 98% 100%  98%  Weight:      Height:       Examination:  General: lying in fetal position,  rocking Lungs: unlabored Neurological: nonverbal Extremities: No clubbing or cyanosis. No edema.    Intake/Output Summary (Last 24 hours) at 10/07/2023 1107 Last data filed at 10/07/2023 0900 Gross per 24 hour  Intake 880 ml  Output --  Net 880 ml    Filed Weights   06/02/23 0500 07/02/23 1700 07/17/23 1531  Weight: 42 kg 42.2 kg 37.4 kg    Scheduled Meds:  feeding supplement  237 mL Oral BID BM   LORazepam   0.5 mg Oral TID   Continuous Infusions:  Nutritional status Signs/Symptoms: severe fat depletion, severe muscle depletion, energy intake < or equal to 50% for > or equal to 5  days Interventions: Magic cup Body mass index is 13.32 kg/m.  Data Reviewed:   CBC: No results for input(s): WBC, NEUTROABS, HGB, HCT, MCV, PLT in the last 168 hours. Basic Metabolic Panel: No results for input(s): NA, K, CL, CO2, GLUCOSE, BUN, CREATININE, CALCIUM , MG, PHOS in the last 168 hours. GFR: CrCl cannot be calculated (Patient's most recent lab result is older than the maximum 21 days allowed.). Liver Function Tests: No results for input(s): AST, ALT, ALKPHOS, BILITOT, PROT, ALBUMIN  in the last 168 hours. No results for input(s): LIPASE, AMYLASE in the last 168 hours. No results for input(s): AMMONIA in the last 168 hours. Coagulation Profile: No results for input(s): INR, PROTIME in the last 168 hours. Cardiac Enzymes: No results for input(s): CKTOTAL, CKMB, CKMBINDEX, TROPONINI in the last 168 hours. BNP (last 3 results) No results for input(s): PROBNP in the last 8760 hours. HbA1C: No results for input(s): HGBA1C in the last 72 hours. CBG: No results for input(s): GLUCAP in the last 168 hours. Lipid Profile: No results for input(s): CHOL, HDL, LDLCALC, TRIG, CHOLHDL, LDLDIRECT in the last 72 hours. Thyroid Function Tests: No results for input(s): TSH, T4TOTAL, FREET4, T3FREE, THYROIDAB in the last 72 hours. Anemia Panel: No results for input(s): VITAMINB12, FOLATE, FERRITIN, TIBC, IRON, RETICCTPCT in the last 72 hours. Sepsis Labs: No results for input(s): PROCALCITON, LATICACIDVEN in the last 168 hours.  No results found for this or any previous visit (from the past 240 hours).       Radiology Studies: No results found.         LOS: 208 days   Time spent=    Donnetta Gains, MD [facility]  If 7PM-7AM, please contact night-coverage  10/07/2023, 11:07 AM

## 2023-10-07 NOTE — Plan of Care (Signed)
  Problem: Safety: Goal: Non-violent Restraint(s) Outcome: Progressing   Problem: Clinical Measurements: Goal: Quality of life will improve Outcome: Progressing   Problem: Role Relationship: Goal: Family's ability to cope with current situation will improve Outcome: Progressing

## 2023-10-08 NOTE — Progress Notes (Signed)
 Progress Note   Patient: Natalie Brown WGN:[id number] DOB: 1965/06/09 DOA: 01/22/2023     209 DOS: the patient was seen and examined on 10/08/2023   Brief hospital course: Long and complicated hospital stay- 58 y.o. F with bipolar disorder and early onset dementia who was admitted for failure to thrive , increasingly erratic behavior and cognitive impairment in 2023. She was evaluated by Neurology and Psychiatry, but no treatable or reversible cause of her impairment could be found.  It appears she has an early onset dementia with comorbid bipolar disorder, leading to weight loss, malnutrition and failure to thrive .   For some reason, during that admission, there was a prolonged delay for over a year in placement in memory care until October 2024, and on arriving at that facility, she exhibited agitation behaviors, and so she was denied entry and sent back to the hospital the same day.     She remained in ER holding for 6 weeks until she was transferred upstairs in Nov 2024 and has been on [facility] since.  [name] nonverbal and does not follow commands. [name] currently in enclosed bed for fall precautions. [name] comfort care only awaiting safe discharge plan.     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  At that time they recommended management of behavioral symptoms per psych and supportive care with palliative conversations.  Bipolar disorder Severe protein calorie malnutrition  Chronic kidney disease stage IIIa  Hypernatremia   Comfort care only: Continue Ativan  0.5 mg  TID, and ensure supplementations.  Continue assistance with meals.  Restraint order renewed.  Working closely with TOC on disposition planning.       Out of bed to chair. Incentive spirometry. Nursing supportive care. Fall, aspiration precautions. Diet:  Diet Orders (From admission, onward)     Start     Ordered   03/13/23 1330  Diet regular Room service appropriate? Yes; Fluid consistency: Thin  Diet effective now       Question Answer Comment  Room service appropriate? Yes   Fluid consistency: Thin      03/13/23 1330           DVT prophylaxis:   Level of care: Med-Surg   Code Status: Limited: Do not attempt resuscitation (DNR) -DNR-LIMITED -Do Not Intubate/DNI   Subjective: Patient is seen and examined today morning. [name] sleeping in enclosed bed. Restraint order renewed.  Physical Exam: Vitals:   10/05/23 2009 10/07/23 1700 10/07/23 2122 10/08/23 1611  BP: 109/70 116/86 129/70 104/65  Pulse: 94 80 87 94  Resp: 18 19 18 19   Temp: 98.2 F (36.8 C) 98.2 F (36.8 C) 98.1 F (36.7 C) 97.9 F (36.6 C)  TempSrc:  Oral    SpO2: 98% 98% 99% 100%  Weight:      Height:        General - Middle aged African American female sleeping comfortably. On and off agitated, on enclosed bed for fall prevention.  Data Reviewed:      Latest Ref Rng & Units 09/04/2023    7:50 AM 08/03/2023   11:04 AM 07/08/2023    5:56  AM  CBC  WBC 4.0 - 10.5 K/uL 4.5  5.1  5.4   Hemoglobin 12.0 - 15.0 g/dL 16.1  09.6  04.5   Hematocrit 36.0 - 46.0 % 35.2  42.3  39.9   Platelets 150 - 400 K/uL 268  271  293       Latest Ref Rng & Units 09/04/2023    7:50 AM 08/03/2023   11:04 AM 07/08/2023    5:56 AM  BMP  Glucose 70 - 99 mg/dL 80  91  73   BUN 6 - 20 mg/dL 21  31  21    Creatinine 0.44 - 1.00 mg/dL 4.09  8.11  9.14   Sodium 135 - 145 mmol/L 142  147  142   Potassium 3.5 - 5.1 mmol/L 3.9  4.3  4.1   Chloride 98 - 111 mmol/L 110  113  108   CO2 22 - 32 mmol/L 25  25  25    Calcium   8.9 - 10.3 mg/dL 8.5  9.3  8.9    No results found. Disposition: Status is: Inpatient Remains inpatient appropriate because: safe placement  Planned Discharge Destination: Barriers to discharge: restraints, behavior, placement is challenging     Time spent: 24 minutes  Author: Aisha Hove, MD 10/08/2023 6:48 PM Secure chat 7am to 7pm For on call review [email or web address].

## 2023-10-08 NOTE — TOC Progression Note (Signed)
 Transition of Care [facility]) - Progression Note    Patient Details  Name: Natalie Brown MRN: [id number] Date of Birth: July 27, 1965  [facility]) CM/SW Contact  Dane Dung, RN Phone Number: 10/08/2023, 11:45 AM  Clinical Narrative:    Patient remains DTP patient with no bed offers for SNF placement at this time.  Hospital leadership continues to follow the patient for exploration for LTC placement - no available beds at this time.  Patient remains in safety enclosure bed at the hospital to prevent falls per MD.        Expected Discharge Plan and Services                                               Social Determinants of Health (SDOH) Interventions SDOH Screenings   Food Insecurity: No Food Insecurity (05/14/2023)  Housing: Low Risk  (01/24/2023)  Transportation Needs: No Transportation Needs (05/14/2023)  Utilities: Not At Risk (05/14/2023)  Alcohol Screen: Low Risk  (09/06/2021)  Depression (PHQ2-9): Medium Risk (05/26/2020)  Tobacco Use: High Risk (04/04/2023)    Readmission Risk Interventions     No data to display

## 2023-10-08 NOTE — Plan of Care (Signed)
 Assumed care at 1900. Pt has been resting comfortably in bed overnight. No significant events overnight.    Problem: Safety: Goal: Non-violent Restraint(s) Outcome: Progressing

## 2023-10-08 NOTE — Plan of Care (Signed)
  Problem: Safety: Goal: Non-violent Restraint(s) Outcome: Progressing   Problem: Clinical Measurements: Goal: Quality of life will improve Outcome: Progressing

## 2023-10-09 NOTE — TOC Initial Note (Signed)
 Transition of Care [facility]) - Initial/Assessment Note    Patient Details  Name: Natalie Brown MRN: [id number] Date of Birth: 01-12-1966  Transition of Care [facility]) CM/SW Contact:    Juliane Och, LCSW Phone Number: 10/09/2023, 10:14 AM  Clinical Narrative:                  10:14 AM CSW expanded SNF search in efforts to obtain a bed offer for patient. Patient currently has 34 SNF bed denials.  Expected Discharge Plan: Skilled Nursing Facility Barriers to Discharge: SNF Pending bed offer, Insurance Authorization   Patient Goals and CMS Choice            Expected Discharge Plan and Services In-house Referral: Clinical Social Work   Post Acute Care Choice: Skilled Nursing Facility Living arrangements for the past 2 months: Assisted Living Facility                                      Prior Living Arrangements/Services Living arrangements for the past 2 months: Assisted Living Facility Lives with:: Facility Resident Patient language and need for interpreter reviewed:: Yes        Need for Family Participation in Patient Care: Yes (Comment)     Criminal Activity/Legal Involvement Pertinent to Current Situation/Hospitalization: No - Comment as needed  Activities of Daily Living   ADL Screening (condition at time of admission) Independently performs ADLs?: No Does the patient have a NEW difficulty with bathing/dressing/toileting/self-feeding that is expected to last >3 days?: No Does the patient have a NEW difficulty with getting in/out of bed, walking, or climbing stairs that is expected to last >3 days?: No Does the patient have a NEW difficulty with communication that is expected to last >3 days?: No Is the patient deaf or have difficulty hearing?: No Does the patient have difficulty seeing, even when wearing glasses/contacts?: No Does the patient have difficulty concentrating, remembering, or making decisions?: Yes  Permission Sought/Granted Permission  sought to share information with : Family Supports, Oceanographer granted to share information with : No (Contact information on chart)  Share Information with NAME: Rubbie Cornwall  Permission granted to share info w AGENCY: SNF  Permission granted to share info w Relationship: Mother  Permission granted to share info w Contact Information: [phone]  Emotional Assessment Appearance:: Appears stated age Attitude/Demeanor/Rapport: Unable to Assess Affect (typically observed): Unable to Assess   Alcohol / Substance Use: Not Applicable Psych Involvement: No (comment)  Admission diagnosis:  Suicidal ideation [R45.851] Delirium [R41.0] Agitation [R45.1] Patient Active Problem List   Diagnosis Date Noted   CKD stage 3a, GFR 45-59 ml/min (HCC) 06/18/2023   Terminal care 03/13/2023   Protein-calorie malnutrition, severe 11/16/2022   B12 deficiency 02/19/2022   Drooling 02/19/2022   Essential hypertension 02/19/2022   Altered mental state 12/26/2021   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 [place] [address] Phone: [phone] Fax: [phone]     Social Drivers of Health (SDOH) Social History: SDOH Screenings   Food Insecurity: No Food Insecurity (05/14/2023)  Housing: Low Risk  (01/24/2023)  Transportation Needs: No Transportation Needs (05/14/2023)  Utilities: Not At  Risk (05/14/2023)  Alcohol Screen: Low Risk  (09/06/2021)  Depression (PHQ2-9): Medium Risk (05/26/2020)  Tobacco Use: High Risk (04/04/2023)   SDOH Interventions:     Readmission Risk Interventions     No data to display

## 2023-10-09 NOTE — Progress Notes (Signed)
 Progress Note   Patient: Natalie Brown WUJ:[id number] DOB: 11/03/1965 DOA: 01/22/2023     210 DOS: the patient was seen and examined on 10/09/2023   Brief hospital course: Long and complicated hospital stay- 58 y.o. F with bipolar disorder and early onset dementia who was admitted for failure to thrive , increasingly erratic behavior and cognitive impairment in 2023. She was evaluated by Neurology and Psychiatry, but no treatable or reversible cause of her impairment could be found.  It appears she has an early onset dementia with comorbid bipolar disorder, leading to weight loss, malnutrition and failure to thrive .   For some reason, during that admission, there was a prolonged delay for over a year in placement in memory care until October 2024, and on arriving at that facility, she exhibited agitation behaviors, and so she was denied entry and sent back to the hospital the same day.     She remained in ER holding for 6 weeks until she was transferred upstairs in Nov 2024 and has been on [facility] since.  [name] nonverbal and does not follow commands. [name] currently in enclosed bed for fall precautions. [name] comfort care only awaiting safe discharge plan.     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  Continue management of behavioral symptoms per psych and supportive care with palliative conversations.  Bipolar disorder Severe protein calorie malnutrition  Chronic kidney disease stage IIIa  Hypernatremia   Comfort care only: Continue Ativan  0.5 mg TID, and ensure supplementations.  Continue  assistance with meals.  Restraint order renewed.  Working closely with TOC on disposition planning. Restraint orders to be renewed daily.       Out of bed to chair. Incentive spirometry. Nursing supportive care. Fall, aspiration precautions. Diet:  Diet Orders (From admission, onward)     Start     Ordered   03/13/23 1330  Diet regular Room service appropriate? Yes; Fluid consistency: Thin  Diet effective now       Question Answer Comment  Room service appropriate? Yes   Fluid consistency: Thin      03/13/23 1330           DVT prophylaxis:   Level of care: Med-Surg   Code Status: Limited: Do not attempt resuscitation (DNR) -DNR-LIMITED -Do Not Intubate/DNI   Subjective: Patient is seen and examined today morning. [name] sleeping in enclosed bed, seems restless. Restraint order renewed.  Physical Exam: Vitals:   10/07/23 1700 10/07/23 2122 10/08/23 1611 10/09/23 0636  BP: 116/86 129/70 104/65 122/73  Pulse: 80 87 94 80  Resp: 19 18 19 20   Temp: 98.2 F (36.8 C) 98.1 F (36.7 C) 97.9 F (36.6 C) 98.5 F (36.9 C)  TempSrc: Oral     SpO2: 98% 99% 100%   Weight:      Height:        General - Middle aged African American female sleeping, on and off agitated. In enclosed bed for fall prevention.  Data Reviewed:      Latest Ref Rng & Units 09/04/2023    7:50 AM 08/03/2023   11:04 AM 07/08/2023    5:56 AM  CBC  WBC 4.0 - 10.5 K/uL 4.5  5.1  5.4   Hemoglobin 12.0 - 15.0 g/dL 42.5  95.6  38.7   Hematocrit 36.0 - 46.0 % 35.2  42.3  39.9   Platelets 150 - 400 K/uL 268  271  293       Latest Ref Rng & Units 09/04/2023    7:50 AM 08/03/2023   11:04 AM 07/08/2023    5:56 AM  BMP  Glucose 70 - 99 mg/dL 80  91  73   BUN 6 - 20 mg/dL 21  31  21    Creatinine 0.44 - 1.00 mg/dL 5.64  3.32  9.51   Sodium 135 - 145 mmol/L 142  147  142   Potassium 3.5 - 5.1 mmol/L 3.9  4.3  4.1   Chloride 98 - 111 mmol/L 110  113  108   CO2 22 - 32 mmol/L 25  25  25    Calcium  8.9 -  10.3 mg/dL 8.5  9.3  8.9    No results found. Disposition: Status is: Inpatient Remains inpatient appropriate because: safe placement  Planned Discharge Destination: Barriers to discharge: restraints, behavior, placement is challenging     Time spent: 23 minutes  Author: Aisha Hove, MD 10/09/2023 1:20 PM Secure chat 7am to 7pm For on call review [email or web address].

## 2023-10-10 DIAGNOSIS — I10 Essential (primary) hypertension: Secondary | ICD-10-CM

## 2023-10-10 DIAGNOSIS — E43 Unspecified severe protein-calorie malnutrition: Secondary | ICD-10-CM

## 2023-10-10 DIAGNOSIS — N1831 Chronic kidney disease, stage 3a: Secondary | ICD-10-CM

## 2023-10-10 NOTE — Progress Notes (Signed)
 Progress Note   Patient: Natalie Brown:[id number] DOB: 1965-12-29 DOA: 01/22/2023     211 DOS: the patient was seen and examined on 10/10/2023   Brief hospital course: Long and complicated hospital stay- 58 y.o. F with bipolar disorder and early onset dementia who was admitted for failure to thrive , increasingly erratic behavior and cognitive impairment in 2023. She was evaluated by Neurology and Psychiatry, but no treatable or reversible cause of her impairment could be found.  It appears she has an early onset dementia with comorbid bipolar disorder, leading to weight loss, malnutrition and failure to thrive .   For some reason, during that admission, there was a prolonged delay for over a year in placement in memory care until October 2024, and on arriving at that facility, she exhibited agitation behaviors, and so she was denied entry and sent back to the hospital the same day.     She remained in ER holding for 6 weeks until she was transferred upstairs in Nov 2024 and has been on [facility] since.  [name] nonverbal and does not follow commands. [name] currently in enclosed bed for fall precautions. [name] comfort care only awaiting safe discharge plan.     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  Continue management of behavioral symptoms per psych and supportive care with palliative conversations.  Bipolar disorder Severe protein calorie malnutrition  Chronic kidney disease stage IIIa  Hypernatremia   Comfort care only: Continue Ativan  0.5 mg TID, and ensure supplementations.  Continue  assistance with meals.  Restraint order renewed.  Working closely with TOC on disposition planning. Restraint orders to be renewed daily.       Out of bed to chair. Incentive spirometry. Nursing supportive care. Fall, aspiration precautions. Diet:  Diet Orders (From admission, onward)     Start     Ordered   03/13/23 1330  Diet regular Room service appropriate? Yes; Fluid consistency: Thin  Diet effective now       Question Answer Comment  Room service appropriate? Yes   Fluid consistency: Thin      03/13/23 1330           DVT prophylaxis:   Level of care: Med-Surg   Code Status: Limited: Do not attempt resuscitation (DNR) -DNR-LIMITED -Do Not Intubate/DNI   Subjective: Patient was resting comfortably.  No nursing concern  Physical Exam: Vitals:   10/07/23 1700 10/07/23 2122 10/08/23 1611 10/09/23 0636  BP: 116/86 129/70 104/65 122/73  Pulse: 80 87 94 80  Resp: 19 18 19 20   Temp: 98.2 F (36.8 C) 98.1 F (36.7 C) 97.9 F (36.6 C) 98.5 F (36.9 C)  TempSrc: Oral     SpO2: 98% 99% 100%   Weight:      Height:       Patient was resting comfortably in an enclosed bed for safety reasons. CVS.  RRR Pulmonary.  Lungs clear  Data Reviewed:      Latest Ref Rng & Units 09/04/2023    7:50 AM 08/03/2023   11:04 AM 07/08/2023    5:56 AM  CBC  WBC 4.0 - 10.5 K/uL 4.5  5.1  5.4   Hemoglobin 12.0 - 15.0 g/dL 96.0  45.4  09.8   Hematocrit 36.0 - 46.0 % 35.2  42.3  39.9   Platelets 150 - 400 K/uL 268  271  293       Latest Ref Rng & Units 09/04/2023    7:50 AM 08/03/2023   11:04 AM 07/08/2023    5:56 AM  BMP  Glucose 70 - 99 mg/dL 80  91  73   BUN 6 - 20 mg/dL 21  31  21    Creatinine 0.44 - 1.00 mg/dL 1.19  1.47  8.29   Sodium 135 - 145 mmol/L 142  147  142   Potassium 3.5 - 5.1 mmol/L 3.9  4.3  4.1   Chloride 98 - 111 mmol/L 110  113  108   CO2 22 - 32 mmol/L 25  25  25    Calcium  8.9 - 10.3 mg/dL 8.5  9.3  8.9    No results found. Disposition: Status is:  Inpatient Remains inpatient appropriate because: safe placement  Planned Discharge Destination: Barriers to discharge: restraints, behavior, placement is challenging     Time spent: 36 minutes  Author: Luna Salinas, MD 10/10/2023 2:14 PM Secure chat 7am to 7pm For on call review [email or web address].

## 2023-10-10 NOTE — Plan of Care (Signed)
  Problem: Safety: Goal: Non-violent Restraint(s) Outcome: Progressing   Problem: Clinical Measurements: Goal: Quality of life will improve Outcome: Progressing   Problem: Role Relationship: Goal: Family's ability to cope with current situation will improve Outcome: Progressing

## 2023-10-11 LAB — TSH: TSH: 1.997 u[IU]/mL (ref 0.350–4.500)

## 2023-10-11 NOTE — Plan of Care (Signed)
  Problem: Safety: Goal: Non-violent Restraint(s) Outcome: Progressing   Problem: Clinical Measurements: Goal: Quality of life will improve Outcome: Progressing   Problem: Role Relationship: Goal: Family's ability to cope with current situation will improve Outcome: Progressing

## 2023-10-11 NOTE — Progress Notes (Signed)
 PROGRESS NOTE    Natalie Brown  FMW:[id number] DOB: 04-27-1965 DOA: 01/22/2023 PCP: [name], NP   Brief Narrative: 58 year old with past medical history significant for bipolar disorder, early onset dementia who was admitted for failure to thrive , increasingly erratic behavior and cognitive impairments in 2023.  She was evaluated by neurology and psychiatry, reversible or treatable cause for her condition could not be found.  It appears that she has a early onset dementia with comorbid bipolar disorder, leading to weight loss and malnutrition and failure to thrive .   For some reason, during that admission, there was a prolonged delay for over a year in placement in memory care until October 2024, and on arriving at that facility, she exhibited agitation behaviors, and so she was denied entry and sent back to the hospital the same day.     She remained in ER holding for 6 weeks until she was transferred upstairs in Nov 2024 and has been on [facility] since.  [name] nonverbal and does not follow commands. [name] currently in enclosed bed for fall precautions. [name] comfort care only awaiting safe discharge plan.     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 - Continue management of behavioral symptoms per psych and supportive care with palliative conversation  Bipolar disorder Severe protein caloric malnutrition Chronic kidney disease stage III Hypernatremia Comfort care only: Continue with Ativan  0.5 3 times daily, Ensure supplements.  Assistance with meals.  COC working on  disposition.   Nutrition Problem: Severe Malnutrition Etiology: chronic illness    Signs/Symptoms: severe fat depletion, severe muscle depletion, energy intake < or equal to 50% for > or equal to 5 days    Interventions: Magic cup  Estimated body mass index is 13.32 kg/m as calculated from the following:   Height as of this encounter: 5' 6 (1.676 m).   Weight as of this encounter: 37.4 kg.   DVT prophylaxis: SCD Code Status: DNR Family Communication:No family at bedside.  Disposition Plan:  Status is: Inpatient Remains inpatient appropriate because: Awaiting safe discharge    Consultants:  Neurology Psych Palliative  Antimicrobials:    Subjective: Alert, lying down in bed, fetal position.   Objective: Vitals:   10/07/23 2122 10/08/23 1611 10/09/23 0636 10/11/23 0500  BP: 129/70 104/65 122/73 99/74  Pulse: 87 94 80 78  Resp: 18 19 20    Temp: 98.1 F (36.7 C) 97.9 F (36.6 C) 98.5 F (36.9 C)   TempSrc:      SpO2: 99% 100%    Weight:      Height:        Intake/Output Summary (Last 24 hours) at 10/11/2023 9287 Last data filed at 10/10/2023 1322 Gross per 24 hour  Intake 295 ml  Output --  Net 295 ml   Filed Weights   06/02/23 0500 07/02/23 1700 07/17/23 1531  Weight: 42 kg 42.2 kg 37.4 kg    Examination:  General exam: Appears calm and comfortable  Respiratory system: Respiratory effort normal. Central nervous system: Alert, does not answer questions, does not follows command   Data Reviewed: I have personally reviewed following labs and imaging studies  CBC: No results for input(s): WBC, NEUTROABS, HGB, HCT, MCV, PLT in the last 168 hours. Basic Metabolic Panel: No results for input(s): NA, K, CL, CO2, GLUCOSE, BUN, CREATININE, CALCIUM , MG, PHOS in the last 168 hours. GFR: CrCl cannot be calculated (Patient's most recent lab result is older than the maximum 21 days allowed.). Liver Function Tests: No  results for input(s): AST, ALT, ALKPHOS, BILITOT, PROT, ALBUMIN  in the last 168 hours. No results for input(s): LIPASE, AMYLASE in the last 168 hours. No results for input(s): AMMONIA in the last 168 hours. Coagulation Profile: No results for input(s): INR, PROTIME in the last 168 hours. Cardiac Enzymes: No results for input(s): CKTOTAL, CKMB, CKMBINDEX, TROPONINI in the last 168 hours. BNP (last 3 results) No results for input(s): PROBNP in the last 8760 hours. HbA1C: No results for input(s): HGBA1C in the last 72 hours. CBG: No results for input(s): GLUCAP in the last 168 hours. Lipid Profile: No results for input(s): CHOL, HDL, LDLCALC, TRIG, CHOLHDL, LDLDIRECT in the last 72 hours. Thyroid Function Tests: No results for input(s): TSH, T4TOTAL, FREET4, T3FREE, THYROIDAB in the last 72 hours. Anemia Panel: No results for input(s): VITAMINB12, FOLATE, FERRITIN, TIBC, IRON, RETICCTPCT in the last 72 hours. Sepsis Labs: No results for input(s): PROCALCITON, LATICACIDVEN in the last 168 hours.  No results found for this or any previous visit (from the past 240 hours).       Radiology Studies: No results found.      Scheduled Meds:  feeding supplement  237 mL Oral BID BM   LORazepam   0.5 mg Oral TID   Continuous Infusions:   LOS: 212 days    Time spent: 35 Minutes    Natalie Mcfarlan A Tonjua Rossetti, MD [facility]   If 7PM-7AM, please contact night-coverage [email or web address]  10/11/2023, 7:12 AM

## 2023-10-12 LAB — RPR: RPR Ser Ql: NONREACTIVE

## 2023-10-12 NOTE — Plan of Care (Signed)
  Problem: Safety: Goal: Non-violent Restraint(s) Outcome: Progressing   Problem: Clinical Measurements: Goal: Quality of life will improve Outcome: Progressing   Problem: Role Relationship: Goal: Family's ability to cope with current situation will improve Outcome: Progressing

## 2023-10-12 NOTE — Progress Notes (Addendum)
 PROGRESS NOTE    Natalie Brown  FMW:[id number] DOB: 10-Oct-1965 DOA: 01/22/2023 PCP: [name], NP   Brief Narrative: 58 year old with past medical history significant for bipolar disorder, early onset dementia who was admitted for failure to thrive , increasingly erratic behavior and cognitive impairments in 2023.  She was evaluated by neurology and psychiatry, reversible or treatable cause for her condition could not be found.  It appears that she has a early onset dementia with comorbid bipolar disorder, leading to weight loss and malnutrition and failure to thrive .   For some reason, during that admission, there was a prolonged delay for over a year in placement in memory care until October 2024, and on arriving at that facility, she exhibited agitation behaviors, and so she was denied entry and sent back to the hospital the same day.     She remained in ER holding for 6 weeks until she was transferred upstairs in Nov 2024 and has been on [facility] since.  [name] nonverbal and does not follow commands. [name] currently in enclosed bed for fall precautions. [name] comfort care only awaiting safe discharge plan.     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 - Continue management of behavioral symptoms per psych and supportive care with palliative conversation  Bipolar disorder Severe protein caloric malnutrition Chronic kidney disease stage III Hypernatremia Comfort care only: Continue with Ativan  0.5 3 times daily, Ensure supplements.  Assistance with meals.  COC working on  disposition.   Nutrition Problem: Severe Malnutrition Etiology: chronic illness    Signs/Symptoms: severe fat depletion, severe muscle depletion, energy intake < or equal to 50% for > or equal to 5 days    Interventions: Magic cup  Estimated body mass index is 13.32 kg/m as calculated from the following:   Height as of this encounter: 5' 6 (1.676 m).   Weight as of this encounter: 37.4 kg.   DVT prophylaxis: SCD Code Status: DNR Family Communication:No family at bedside.  Disposition Plan:  Status is: Inpatient Remains inpatient appropriate because: Awaiting safe discharge    Consultants:  Neurology Psych Palliative  Antimicrobials:    Subjective: In bed, fetal position.   Objective: Vitals:   10/07/23 2122 10/08/23 1611 10/09/23 0636 10/11/23 0500  BP: 129/70 104/65 122/73 99/74  Pulse: 87 94 80 78  Resp: 18 19 20    Temp: 98.1 F (36.7 C) 97.9 F (36.6 C) 98.5 F (36.9 C)   TempSrc:      SpO2: 99% 100%    Weight:      Height:        Intake/Output Summary (Last 24 hours) at 10/12/2023 1329 Last data filed at 10/12/2023 1226 Gross per 24 hour  Intake 960 ml  Output --  Net 960 ml   Filed Weights   06/02/23 0500 07/02/23 1700 07/17/23 1531  Weight: 42 kg 42.2 kg 37.4 kg    Examination:  General exam: NAD Respiratory system: Normal Resp effort Central nervous system: alert, does not follows command. Does not answer questions.    Data Reviewed: I have personally reviewed following labs and imaging studies  CBC: No results for input(s):  WBC, NEUTROABS, HGB, HCT, MCV, PLT in the last 168 hours. Basic Metabolic Panel: No results for input(s): NA, K, CL, CO2, GLUCOSE, BUN, CREATININE, CALCIUM , MG, PHOS in the last 168 hours. GFR: CrCl cannot be calculated (Patient's most recent lab result is older than the maximum 21 days allowed.). Liver Function Tests: No results for input(s): AST, ALT, ALKPHOS,  BILITOT, PROT, ALBUMIN  in the last 168 hours. No results for input(s): LIPASE, AMYLASE in the last 168 hours. No results for input(s): AMMONIA in the last 168 hours. Coagulation Profile: No results for input(s): INR, PROTIME in the last 168 hours. Cardiac Enzymes: No results for input(s): CKTOTAL, CKMB, CKMBINDEX, TROPONINI in the last 168 hours. BNP (last 3 results) No results for input(s): PROBNP in the last 8760 hours. HbA1C: No results for input(s): HGBA1C in the last 72 hours. CBG: No results for input(s): GLUCAP in the last 168 hours. Lipid Profile: No results for input(s): CHOL, HDL, LDLCALC, TRIG, CHOLHDL, LDLDIRECT in the last 72 hours. Thyroid Function Tests: Recent Labs    10/11/23 1110  TSH 1.997   Anemia Panel: No results for input(s): VITAMINB12, FOLATE, FERRITIN, TIBC, IRON, RETICCTPCT in the last 72 hours. Sepsis Labs: No results for input(s): PROCALCITON, LATICACIDVEN in the last 168 hours.  No results found for this or any previous visit (from the past 240 hours).       Radiology Studies: No results found.      Scheduled Meds:  feeding supplement  237 mL Oral BID BM   LORazepam   0.5 mg Oral TID   Continuous Infusions:   LOS: 213 days    Time spent: 35 Minutes    Ainsley Deakins A Randalyn Ahmed, MD [facility]   If 7PM-7AM, please contact night-coverage [email or web address]  10/12/2023, 1:29 PM

## 2023-10-13 NOTE — Progress Notes (Signed)
 PROGRESS NOTE  Natalie Brown FMW:[id number] DOB: Jan 13, 1966 DOA: 01/22/2023 PCP: [name], NP   LOS: 214 days   Brief narrative:  58 year old with past medical history significant for bipolar disorder, early onset dementia who was admitted for failure to thrive , increasingly erratic behavior and cognitive impairments in 2023.  She was evaluated by neurology and psychiatry, reversible or treatable cause for her condition could not be found.  It appears that she has a early onset dementia with comorbid bipolar disorder, leading to weight loss and malnutrition and failure to thrive .  For some reason, during that admission, there was a prolonged delay for over a year in placement in memory care until October 2024, and on arriving at that facility, she exhibited agitation behaviors, and so she was denied entry and sent back to the hospital the same day.  She remained in ER holding for 6 weeks until she was transferred upstairs in Nov 2024 and has been on [facility] since.  [name] nonverbal and does not follow commands. [name] currently in enclosed bed for fall precautions. [name] comfort care only awaiting safe discharge plan.    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  Supportive care.  Palliative care on board.  Bipolar disorder Severe protein caloric malnutrition Chronic kidney disease stage III Hypernatremia Comfort care only: Continue Ativan  Ensure assistance with meals.     Nutrition Status: Body mass index is 13.32 kg/m.  Nutrition Problem: Severe Malnutrition Etiology: chronic  illness Signs/Symptoms: severe fat depletion, severe muscle depletion, energy intake < or equal to 50% for > or equal to 5 days Interventions: Magic cup  DVT prophylaxis: None   Disposition: No family at bedside.  Status is: Inpatient Remains inpatient appropriate because: Pending disposition    Code Status:     Code Status: Limited: Do not attempt resuscitation (DNR) -DNR-LIMITED -Do Not Intubate/DNI   Family Communication: None at bedside  Consultants: Neurology Psychiatry Palliative care  Procedures: None  Anti-infectives:  None  Anti-infectives (From admission, onward)    None       Subjective: Today, patient was seen and examined at bedside.  No interval complaints reported.  Objective: Vitals:   10/11/23 0500 10/12/23 1919  BP: 99/74 (!) 93/46  Pulse: 78 68  Resp:  18  Temp:  98 F (36.7 C)  SpO2:      Intake/Output Summary (Last 24 hours) at 10/13/2023 1012 Last data filed at 10/12/2023 1226 Gross per 24 hour  Intake 120 ml  Output --  Net 120 ml   Filed Weights   06/02/23 0500 07/02/23 1700 07/17/23 1531  Weight: 42 kg 42.2 kg 37.4 kg   Body mass index is 13.32 kg/m.   Physical Exam:  GENERAL: Patient is alert awake and oriented. Not in obvious distress. HENT: No scleral pallor or icterus. Pupils equally reactive to light. Oral mucosa is moist NECK: is supple, no gross swelling noted. CHEST: Clear to auscultation. No crackles or wheezes.   CVS: S1 and S2 heard, no murmur. Regular rate and rhythm.  ABDOMEN: Soft, non-tender, bowel sounds are present. EXTREMITIES: No edema. CNS: Cranial nerves are intact. No focal motor deficits. SKIN: warm  and dry without rashes.  Data Review: I have personally reviewed the following laboratory data and studies,  CBC: No results for input(s): WBC, NEUTROABS, HGB, HCT, MCV, PLT in the last 168 hours. Basic Metabolic Panel: No results for input(s): NA, K, CL, CO2, GLUCOSE,  BUN, CREATININE, CALCIUM , MG, PHOS in the last 168 hours. Liver Function Tests: No results for input(s): AST, ALT, ALKPHOS, BILITOT, PROT, ALBUMIN  in the last 168 hours. No results for input(s): LIPASE, AMYLASE in the last 168 hours. No results for input(s): AMMONIA in the last 168 hours. Cardiac Enzymes: No results for input(s): CKTOTAL, CKMB, CKMBINDEX, TROPONINI in the last 168 hours. BNP (last 3 results) Recent Labs    01/23/23 1239  BNP 9.4    ProBNP (last 3 results) No results for input(s): PROBNP in the last 8760 hours.  CBG: No results for input(s): GLUCAP in the last 168 hours. No results found for this or any previous visit (from the past 240 hours).   Studies: No results found.    Sriansh Farra, MD  [facility] 10/13/2023  If 7PM-7AM, please contact night-coverage

## 2023-10-13 NOTE — Plan of Care (Signed)
  Problem: Safety: Goal: Non-violent Restraint(s) Outcome: Progressing   Problem: Clinical Measurements: Goal: Quality of life will improve Outcome: Progressing   Problem: Role Relationship: Goal: Family's ability to cope with current situation will improve Outcome: Progressing

## 2023-10-14 NOTE — Plan of Care (Signed)
  Problem: Safety: Goal: Non-violent Restraint(s) Outcome: Progressing   Problem: Clinical Measurements: Goal: Quality of life will improve Outcome: Progressing   Problem: Role Relationship: Goal: Family's ability to cope with current situation will improve Outcome: Progressing

## 2023-10-14 NOTE — Progress Notes (Signed)
 PROGRESS NOTE  ANUOLUWAPO MEFFERD FMW:[id number] DOB: Nov 18, 1965 DOA: 01/22/2023 PCP: [name], NP   LOS: 215 days   Brief narrative:  58 year old with past medical history significant for bipolar disorder, early onset dementia who was admitted for failure to thrive , increasingly erratic behavior and cognitive impairments in 2023.  She was evaluated by neurology and psychiatry, reversible or treatable cause for her condition could not be found.  It appears that she has a early onset dementia with comorbid bipolar disorder, leading to weight loss and malnutrition and failure to thrive .  For some reason, during that admission, there was a prolonged delay for over a year in placement in memory care until October 2024, and on arriving at that facility, she exhibited agitation behaviors, and so she was denied entry and sent back to the hospital the same day.  She remained in ER holding for 6 weeks until she was transferred upstairs in Nov 2024 and has been on [facility] since.  [name] nonverbal and does not follow commands. [name] currently in enclosed bed for fall precautions. [name] comfort care only, awaiting safe discharge plan.    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  Supportive care.  Palliative care on board. Currently on comfort care   Bipolar disorder Severe protein caloric malnutrition Chronic kidney disease stage III Hypernatremia Comfort care only: Continue Ativan  Ensure assistance with meals.     Nutrition Status: Body mass index is 13.32 kg/m.  Nutrition Problem: Severe  Malnutrition Etiology: chronic illness Signs/Symptoms: severe fat depletion, severe muscle depletion, energy intake < or equal to 50% for > or equal to 5 days Interventions: Magic cup  DVT prophylaxis: None   Disposition: No family at bedside.  Status is: Inpatient Remains inpatient appropriate because: Long length of stay.  Pending safe disposition    Code Status:     Code Status: Limited: Do not attempt resuscitation (DNR) -DNR-LIMITED -Do Not Intubate/DNI   Family Communication: None at bedside  Consultants: Neurology Psychiatry Palliative care  Procedures: None  Anti-infectives:  None  Anti-infectives (From admission, onward)    None       Subjective: Today, patient was seen and examined at bedside.  No interval complaints reported.  Lying in bed currently.  Nonverbal.  Objective: Vitals:   10/12/23 1919 10/13/23 1620  BP: (!) 93/46 123/72  Pulse: 68 88  Resp: 18 16  Temp: 98 F (36.7 C)   SpO2:  97%   No intake or output data in the 24 hours ending 10/14/23 0922  Filed Weights   06/02/23 0500 07/02/23 1700 07/17/23 1531  Weight: 42 kg 42.2 kg 37.4 kg   Body mass index is 13.32 kg/m.   Physical Exam:  GENERAL: Patient is alert awake, nonverbal, lying in bed in curled up position, thinly built.  Not in obvious distress. Inside the enclosure bed.  Data Review: I have personally reviewed the following laboratory data and studies,  CBC: No results for input(s): WBC, NEUTROABS, HGB, HCT, MCV, PLT in the last 168 hours. Basic Metabolic Panel: No results for input(s): NA, K, CL, CO2, GLUCOSE, BUN, CREATININE, CALCIUM , MG, PHOS in the last 168 hours. Liver  Function Tests: No results for input(s): AST, ALT, ALKPHOS, BILITOT, PROT, ALBUMIN  in the last 168 hours. No results for input(s): LIPASE, AMYLASE in the last 168 hours. No results for input(s): AMMONIA in the last 168 hours. Cardiac  Enzymes: No results for input(s): CKTOTAL, CKMB, CKMBINDEX, TROPONINI in the last 168 hours. BNP (last 3 results) Recent Labs    01/23/23 1239  BNP 9.4    ProBNP (last 3 results) No results for input(s): PROBNP in the last 8760 hours.  CBG: No results for input(s): GLUCAP in the last 168 hours. No results found for this or any previous visit (from the past 240 hours).   Studies: No results found.    Syriah Delisi, MD  [facility] 10/14/2023  If 7PM-7AM, please contact night-coverage

## 2023-10-15 NOTE — Progress Notes (Addendum)
 Progress Note   Patient: Natalie Brown FMW:[id number] DOB: Mar 04, 1966 DOA: 01/22/2023     216 DOS: the patient was seen and examined on 10/15/2023   Brief hospital course:    58 year old with past medical history significant for bipolar disorder, early onset dementia who was admitted for failure to thrive , increasingly erratic behavior and cognitive impairments in 2023.  She was evaluated by neurology and psychiatry, reversible or treatable cause for her condition could not be found.  It appears that she has a early onset dementia with comorbid bipolar disorder, leading to weight loss and malnutrition and failure to thrive .  For some reason, during that admission, there was a prolonged delay for over a year in placement in memory care until October 2024, and on arriving at that facility, she exhibited agitation behaviors, and so she was denied entry and sent back to the hospital the same day.  She remained in ER holding for 6 weeks until she was transferred upstairs in Nov 2024 and has been on [facility] since.  [name] nonverbal and does not follow commands. [name] currently in enclosed bed for fall precautions. [name] comfort care only, awaiting safe discharge plan.    Assessment and Plan:   Early onset dementia with behavioral disturbance Presentation thought related to progression of underlying neurodegenerative process that manifested itself as early onset dementia/psychiatric disorder..  Supportive care.  Palliative care on board. Currently on comfort care  Patient appears comfortable on current comfort care measures   Bipolar disorder Severe protein caloric malnutrition Chronic kidney disease stage III Hypernatremia Comfort care only: Continue Ativan  Ensure assistance with meals.     Nutrition Status: Body mass index is 13.32 kg/m.  Nutrition Problem: Severe Malnutrition Etiology: chronic illness Signs/Symptoms: severe fat depletion, severe muscle depletion, energy intake < or equal  to 50% for > or equal to 5 days Interventions: Magic cup   DVT prophylaxis: None     Disposition: No family at bedside.   Status is: Inpatient Remains inpatient appropriate because: Long length of stay.  Pending safe disposition     Code Status:     Code Status: Limited: Do not attempt resuscitation (DNR) -DNR-LIMITED -Do Not Intubate/DNI    Family Communication: None at bedside   Consultants: Neurology Psychiatry Palliative care   Procedures: None    Subjective:  Seen and examined at bedside this morning currently on comfort care measures Unable to provide subjective information on account of persistent catatonic state.  Physical Exam:  GENERAL: Patient is alert awake, nonverbal, lying in bed in curled up position, thinly built.  Not in obvious distress. Inside the enclosure bed.  Data Reviewed:     Latest Ref Rng & Units 09/04/2023    7:50 AM 08/03/2023   11:04 AM 07/08/2023    5:56 AM  CBC  WBC 4.0 - 10.5 K/uL 4.5  5.1  5.4   Hemoglobin 12.0 - 15.0 g/dL 88.3  86.1  86.7   Hematocrit 36.0 - 46.0 % 35.2  42.3  39.9   Platelets 150 - 400 K/uL 268  271  293        Latest Ref Rng & Units 09/04/2023    7:50 AM 08/03/2023   11:04 AM 07/08/2023    5:56 AM  BMP  Glucose 70 - 99 mg/dL 80  91  73   BUN 6 - 20 mg/dL 21  31  21    Creatinine 0.44 - 1.00 mg/dL 8.97  8.78  8.96   Sodium  135 - 145 mmol/L 142  147  142   Potassium 3.5 - 5.1 mmol/L 3.9  4.3  4.1   Chloride 98 - 111 mmol/L 110  113  108   CO2 22 - 32 mmol/L 25  25  25    Calcium  8.9 - 10.3 mg/dL 8.5  9.3  8.9      Vitals:   10/12/23 1919 10/13/23 1620 10/14/23 1635 10/14/23 2115  BP: (!) 93/46 123/72 113/66 131/82  Pulse: 68 88 76 70  Resp: 18 16  18   Temp: 98 F (36.7 C)   (!) 97.5 F (36.4 C)  TempSrc: Oral     SpO2:  97%  100%  Weight:      Height:         .  Author: Drue ONEIDA Potter, MD 10/15/2023 11:24 AM  For on call review [email or web address].

## 2023-10-15 NOTE — Plan of Care (Signed)
  Problem: Safety: Goal: Non-violent Restraint(s) Outcome: Not Applicable   Problem: Clinical Measurements: Goal: Quality of life will improve Outcome: Not Applicable   Problem: Role Relationship: Goal: Family's ability to cope with current situation will improve Outcome: Not Applicable

## 2023-10-16 NOTE — Progress Notes (Signed)
 Progress Note   Patient: Natalie Brown FMW:[id number] DOB: Jun 02, 1965 DOA: 01/22/2023     217 DOS: the patient was seen and examined on 10/16/2023     Brief hospital course:     58 year old with past medical history significant for bipolar disorder, early onset dementia who was admitted for failure to thrive , increasingly erratic behavior and cognitive impairments in 2023.  She was evaluated by neurology and psychiatry, reversible or treatable cause for her condition could not be found.  It appears that she has a early onset dementia with comorbid bipolar disorder, leading to weight loss and malnutrition and failure to thrive .  For some reason, during that admission, there was a prolonged delay for over a year in placement in memory care until October 2024, and on arriving at that facility, she exhibited agitation behaviors, and so she was denied entry and sent back to the hospital the same day.  She remained in ER holding for 6 weeks until she was transferred upstairs in Nov 2024 and has been on [facility] since.  [name] nonverbal and does not follow commands. [name] currently in enclosed bed for fall precautions. [name] comfort care only, awaiting safe discharge plan.     Assessment and Plan:    Early onset dementia with behavioral disturbance Presentation thought related to progression of underlying neurodegenerative process that manifested itself as early onset dementia/psychiatric disorder..  Continue supportive care and comfort care measures  Palliative care on board.  Currently on comfort care  Patient appears comfortable on current comfort care measures   Bipolar disorder Severe protein caloric malnutrition Chronic kidney disease stage III Hypernatremia Comfort care only: Continue Ativan  Ensure assistance with meals.     Nutrition Status: Body mass index is 13.32 kg/m.  Nutrition Problem: Severe Malnutrition Etiology: chronic illness Signs/Symptoms: severe fat depletion,  severe muscle depletion, energy intake < or equal to 50% for > or equal to 5 days Interventions: Magic cup   DVT prophylaxis: None     Disposition: No family at bedside.   Status is: Inpatient Remains inpatient appropriate because: Long length of stay.  Pending safe disposition     Code Status:     Code Status: Limited: Do not attempt resuscitation (DNR) -DNR-LIMITED -Do Not Intubate/DNI    Family Communication: None at bedside   Consultants: Neurology Psychiatry Palliative care   Procedures: None     Subjective:  Patient seen and examined at bedside this morning Continues to remain curled up in bed No signs of distress Still continues to appear comfortable on comfort care measures   Physical Exam:   GENERAL: Patient is alert awake, nonverbal, lying in bed in curled up position, thinly built.  Not in obvious distress. Inside the enclosure bed.   Data Reviewed:     Vitals:   10/13/23 1620 10/14/23 1635 10/14/23 2115 10/15/23 1617  BP: 123/72 113/66 131/82 (!) 96/57  Pulse: 88 76 70 73  Resp: 16  18   Temp:   (!) 97.5 F (36.4 C) 97.6 F (36.4 C)  TempSrc:      SpO2: 97%  100% 100%  Weight:      Height:          Latest Ref Rng & Units 09/04/2023    7:50 AM 08/03/2023   11:04 AM 07/08/2023    5:56 AM  CBC  WBC 4.0 - 10.5 K/uL 4.5  5.1  5.4   Hemoglobin 12.0 - 15.0 g/dL 88.3  86.1  13.2  Hematocrit 36.0 - 46.0 % 35.2  42.3  39.9   Platelets 150 - 400 K/uL 268  271  293      Author: Drue ONEIDA Potter, MD 10/16/2023 11:16 AM  For on call review [email or web address].

## 2023-10-17 NOTE — Progress Notes (Signed)
  Progress Note   Patient: Natalie Brown FMW:[id number] DOB: [birth year] DOA: 01/22/2023     218 DOS: the patient was seen and examined on 10/17/2023     Brief hospital course:     58 year old with past medical history significant for bipolar disorder, early onset dementia who was admitted for failure to thrive , increasingly erratic behavior and cognitive impairments in 2023.  She was evaluated by neurology and psychiatry, reversible or treatable cause for her condition could not be found.  It appears that she has a early onset dementia with comorbid bipolar disorder, leading to weight loss and malnutrition and failure to thrive .  For some reason, during that admission, there was a prolonged delay for over a year in placement in memory care until October 2024, and on arriving at that facility, she exhibited agitation behaviors, and so she was denied entry and sent back to the hospital the same day.  She remained in ER holding for 6 weeks until she was transferred upstairs in Nov 2024 and has been on [facility] since.  [name] nonverbal and does not follow commands. [name] currently in enclosed bed for fall precautions. [name] comfort care only, awaiting safe discharge plan.     Assessment and Plan:    Early onset dementia with behavioral disturbance Presentation thought related to progression of underlying neurodegenerative process that manifested itself as early onset dementia/psychiatric disorder..  Continue supportive care and comfort care measures  Palliative care on board.  Currently on comfort care  Patient appears comfortable on current comfort care measures   Bipolar disorder Severe protein caloric malnutrition Chronic kidney disease stage III Hypernatremia Comfort care only: Continue Ativan  Ensure assistance with meals.     Nutrition Status: Body mass index is 13.32 kg/m.  Nutrition Problem: Severe Malnutrition Etiology: chronic illness Signs/Symptoms: severe fat depletion,  severe muscle depletion, energy intake < or equal to 50% for > or equal to 5 days Interventions: Magic cup   DVT prophylaxis: None     Disposition: No family at bedside.   Status is: Inpatient Remains inpatient appropriate because: Long length of stay.  Pending safe disposition     Code Status:     Code Status: Limited: Do not attempt resuscitation (DNR) -DNR-LIMITED -Do Not Intubate/DNI    Family Communication: None at bedside   Consultants: Neurology Psychiatry Palliative care   Procedures: None     Subjective:  Patient seen and examined at bedside this morning, she remain curled up in bed No signs of distress Still continues to appear comfortable on comfort care measures   Physical Exam:   GENERAL: Patient is alert awake, nonverbal, lying in bed in curled up position, thinly built.  Not in obvious distress. Inside the enclosure bed.   Data Reviewed:     Vitals:   10/14/23 1635 10/14/23 2115 10/15/23 1617 10/16/23 1730  BP: 113/66 131/82 (!) 96/57 105/67  Pulse: 76 70 73 73  Resp:  18  16  Temp:  (!) 97.5 F (36.4 C) 97.6 F (36.4 C)   TempSrc:      SpO2:  100% 100% 99%  Weight:      Height:         Author: Drue ONEIDA Potter, MD 10/17/2023 10:59 AM  For on call review [email or web address].

## 2023-10-18 MED ORDER — LORAZEPAM 2 MG/ML PO CONC
1.0000 mg | Freq: Three times a day (TID) | ORAL | Status: DC
  Administered 2023-10-18 – 2023-12-18 (×185): 1 mg via ORAL
  Filled 2023-10-18 (×177): qty 1

## 2023-10-18 NOTE — Progress Notes (Signed)
  Progress Note   Patient: RICARDO KAYES FMW:[id number] DOB: [birth year] DOA: 01/22/2023     219 DOS: the patient was seen and examined on 10/18/2023     Brief hospital course:     58 year old with past medical history significant for bipolar disorder, early onset dementia who was admitted for failure to thrive , increasingly erratic behavior and cognitive impairments in 2023.  She was evaluated by neurology and psychiatry, reversible or treatable cause for her condition could not be found.  It appears that she has a early onset dementia with comorbid bipolar disorder, leading to weight loss and malnutrition and failure to thrive .  For some reason, during that admission, there was a prolonged delay for over a year in placement in memory care until October 2024, and on arriving at that facility, she exhibited agitation behaviors, and so she was denied entry and sent back to the hospital the same day.  She remained in ER holding for 6 weeks until she was transferred upstairs in Nov 2024 and has been on [facility] since.  [name] nonverbal and does not follow commands. [name] currently in enclosed bed for fall precautions. [name] comfort care only, awaiting safe discharge plan.     Assessment and Plan:    Early onset dementia with behavioral disturbance Presentation thought related to progression of underlying neurodegenerative process that manifested itself as early onset dementia/psychiatric disorder..  Continue supportive care and comfort care measures  Palliative care on board.  Currently on comfort care  Patient appears comfortable on current comfort care measures   Bipolar disorder Severe protein caloric malnutrition Chronic kidney disease stage III Hypernatremia Comfort care only: Continue Ativan  Ensure assistance with meals.     Nutrition Status: Body mass index is 13.32 kg/m.  Nutrition Problem: Severe Malnutrition Etiology: chronic illness Signs/Symptoms: severe fat depletion,  severe muscle depletion, energy intake < or equal to 50% for > or equal to 5 days Interventions: Magic cup   DVT prophylaxis: None     Disposition: No family at bedside.   Status is: Inpatient Remains inpatient appropriate because: Long length of stay.  Pending safe disposition     Code Status:     Code Status: Limited: Do not attempt resuscitation (DNR) -DNR-LIMITED -Do Not Intubate/DNI    Family Communication: None at bedside   Consultants: Neurology Psychiatry Palliative care   Procedures: None     Subjective:  Patient seen and examined at bedside this morning  She remains curled up in bed No signs of distress Still continues to appear comfortable on comfort care measures   Physical Exam:   GENERAL: Patient is alert awake, nonverbal, lying in bed in curled up position, thinly built.  Not in obvious distress. Inside the enclosure bed.   Data Reviewed:   No new labs to review   Vitals:   10/14/23 1635 10/14/23 2115 10/15/23 1617 10/16/23 1730  BP: 113/66 131/82 (!) 96/57 105/67  Pulse: 76 70 73 73  Resp:  18  16  Temp:  (!) 97.5 F (36.4 C) 97.6 F (36.4 C)   TempSrc:      SpO2:  100% 100% 99%  Weight:      Height:         Author: Drue ONEIDA Potter, MD 10/18/2023 2:52 PM  For on call review [email or web address].

## 2023-10-19 NOTE — Progress Notes (Signed)
  Progress Note   Patient: Natalie Brown FMW:[id number] DOB: 1965/09/26 DOA: 01/22/2023     220 DOS: the patient was seen and examined on 10/19/2023  Hospital course:  58 year old with past medical history significant for bipolar disorder, early onset dementia who was admitted for failure to thrive , increasingly erratic behavior and cognitive impairments in 2023.  She was evaluated by neurology and psychiatry, reversible or treatable cause for her condition could not be found.  It appears that she has a early onset dementia with comorbid bipolar disorder, leading to weight loss and malnutrition and failure to thrive .  For some reason, during that admission, there was a prolonged delay for over a year in placement in memory care until October 2024, and on arriving at that facility, she exhibited agitation behaviors, and so she was denied entry and sent back to the hospital the same day.  She remained in ER holding for 6 weeks until she was transferred upstairs in Nov 2024 and has been on [facility] since.  [name] nonverbal and does not follow commands. [name] currently in enclosed bed for fall precautions. [name] comfort care only awaiting safe discharge plan.  Subjective: Patient is in the enclosed area, unable to respond moving extremities intermittently, remains in fetal position no communication   Assessment and plan: Early onset dementia with behavioral disturbance Presentation thought related to progression of underlying neurodegenerative process that manifested itself as early onset dementia/psychiatric disorder..  Supportive care.  Palliative care on board.  Bipolar disorder Severe protein caloric malnutrition Chronic kidney disease stage III Hypernatremia Comfort care only: Continue Ativan  Ensure assistance with meals.     Nutrition Status: Body mass index is 13.32 kg/m.  Nutrition Problem: Severe Malnutrition Etiology: chronic illness Signs/Symptoms: severe fat depletion, severe  muscle depletion, energy intake < or equal to 50% for > or equal to 5 days Interventions: Magic cup    DVT prophylaxis: None Disposition: No family at bedside. Status is: Inpatient Remains inpatient appropriate because: Long length of stay.  Pending safe disposition   Code Status:     Code Status: Limited: Do not attempt resuscitation (DNR) -DNR-LIMITED -Do Not Intubate/DNI    Family Communication: None at bedside   Consultants: Neurology Psychiatry Palliative care   Procedures: None   Physical Exam: Patient is minimally responsive, nonverbal in fetal position  IN enclosed space  Further exam deferred for comfort   Data Reviewed:   No new labs to review   Vitals:   10/14/23 2115 10/15/23 1617 10/16/23 1730 10/18/23 2002  BP: 131/82 (!) 96/57 105/67 115/65  Pulse: 70 73 73 68  Resp: 18  16 18   Temp: (!) 97.5 F (36.4 C) 97.6 F (36.4 C)  97.9 F (36.6 C)  TempSrc:      SpO2: 100% 100% 99% 99%  Weight:      Height:         Author: Mennie LAMY, MD 10/19/2023 10:42 AM  For on call review [email or web address].

## 2023-10-19 NOTE — Plan of Care (Signed)
   Problem: Education: Goal: Knowledge of General Education information will improve Description: Including pain rating scale, medication(s)/side effects and non-pharmacologic comfort measures Outcome: Progressing   Problem: Clinical Measurements: Goal: Ability to maintain clinical measurements within normal limits will improve Outcome: Progressing

## 2023-10-20 DIAGNOSIS — R45851 Suicidal ideations: Secondary | ICD-10-CM

## 2023-10-20 NOTE — Progress Notes (Signed)
 [facility]                                                                              Natalie Brown, is a 58 y.o. female, DOB - 1966-02-19, FMW:[id number] Admit date - 01/22/2023    Outpatient Primary MD for the patient is [name], Ronal Caldron, NP  LOS - 221  days  Chief Complaint  Patient presents with   Agitation       Brief summary   58 year old with past medical history significant for bipolar disorder, early onset dementia who was admitted for failure to thrive , increasingly erratic behavior and cognitive impairments in 2023.  She was evaluated by neurology and psychiatry, reversible or treatable cause for her condition could not be found.  It appears that she has a early onset dementia with comorbid bipolar disorder, leading to weight loss and malnutrition and failure to thrive .  For some reason, during that admission, there was a prolonged delay for over a year in placement in memory care until October 2024, and on arriving at that facility, she exhibited agitation behaviors, and so she was denied entry and sent back to the hospital the same day.  She remained in ER holding for 6 weeks until she was transferred upstairs in Nov 2024 and has been on [facility] since.  [name] nonverbal and does not follow commands. [name] currently in enclosed bed for fall precautions. [name] comfort care only awaiting safe discharge plan.     Assessment & Plan     Early onset dementia with behavioral disturbance - Presentation thought related to progression of underlying neurodegenerative process that manifested itself as early onset dementia/psychiatric disorder. - Supportive, terminal care, seen by psychiatry and palliative care    Bipolar disorder Chronic kidney disease stage III Hypernatremia Comfort care Continue Ativan  Ensure assistance with meals.         Severe protein calorie malnutrition Nutrition Problem: Severe Malnutrition Etiology: chronic  illness Signs/Symptoms: severe fat depletion, severe muscle depletion, energy intake < or equal to 50% for > or equal to 5 days Interventions: Magic cup  Underweight Estimated body mass index is 13.32 kg/m as calculated from the following:   Height as of this encounter: 5' 6 (1.676 m).   Weight as of this encounter: 37.4 kg.  Code Status: DNR/DNI, comfort care DVT Prophylaxis:     Level of Care: Level of care: Med-Surg Family Communication:  Disposition Plan:      Remains inpatient appropriate:      Procedures:    Consultants:     Antimicrobials:   Anti-infectives (From admission, onward)    None          Medications  feeding supplement  237 mL Oral BID BM   LORazepam   1 mg Oral TID      Subjective:   Natalie Brown was seen and examined today.  Unable to obtain ROS from the patient.  No acute events overnight.    Objective:   Vitals:   10/14/23 2115 10/15/23 1617 10/16/23 1730 10/18/23 2002  BP: 131/82 (!) 96/57 105/67 115/65  Pulse: 70 73 73 68  Resp: 18  16 18  Temp: (!) 97.5 F (36.4 C) 97.6 F (36.4 C)  97.9 F (36.6 C)  TempSrc:      SpO2: 100% 100% 99% 99%  Weight:      Height:        Intake/Output Summary (Last 24 hours) at 10/20/2023 1243 Last data filed at 10/19/2023 1449 Gross per 24 hour  Intake 240 ml  Output --  Net 240 ml     Wt Readings from Last 3 Encounters:  07/17/23 37.4 kg  01/21/23 46 kg  12/24/21 72 kg     Exam General:  lying in the fetal position, appears comfortable Cardiovascular: S1-S2. Respiratory: Unlabored Gastrointestinal:  Ext: no pedal edema bilaterally Neuro: unable to assess, nonverbal, does not follow commands severe muscle depletion     Data Reviewed:  I have personally reviewed following labs    CBC Lab Results  Component Value Date   WBC 4.5 09/04/2023   RBC 3.89 09/04/2023   HGB 11.6 (L) 09/04/2023   HCT 35.2 (L) 09/04/2023   MCV 90.5 09/04/2023   MCH 29.8 09/04/2023   PLT  268 09/04/2023   MCHC 33.0 09/04/2023   RDW 14.4 09/04/2023   LYMPHSABS 1.9 09/04/2023   MONOABS 0.4 09/04/2023   EOSABS 0.1 09/04/2023   BASOSABS 0.0 09/04/2023     Last metabolic panel Lab Results  Component Value Date   NA 142 09/04/2023   K 3.9 09/04/2023   CL 110 09/04/2023   CO2 25 09/04/2023   BUN 21 (H) 09/04/2023   CREATININE 1.02 (H) 09/04/2023   GLUCOSE 80 09/04/2023   GFRNONAA >60 09/04/2023   GFRAA 65 09/19/2017   CALCIUM  8.5 (L) 09/04/2023   PHOS 3.0 03/26/2023   PROT 7.2 08/03/2023   ALBUMIN  3.3 (L) 08/03/2023   LABGLOB 2.8 06/21/2020   AGRATIO 1.5 06/21/2020   BILITOT 0.6 08/03/2023   ALKPHOS 40 08/03/2023   AST 18 08/03/2023   ALT 15 08/03/2023   ANIONGAP 7 09/04/2023    CBG (last 3)  No results for input(s): GLUCAP in the last 72 hours.    Coagulation Profile: No results for input(s): INR, PROTIME in the last 168 hours.   Radiology Studies: I have personally reviewed the imaging studies  No results found.     Nydia Distance M.D. [facility] 10/20/2023, 12:43 PM  Available via Epic secure chat 7am-7pm After 7 pm, please refer to night coverage provider listed on amion.

## 2023-10-20 NOTE — Plan of Care (Signed)

## 2023-10-21 NOTE — Progress Notes (Signed)
  Progress Note   Patient: Natalie Brown FMW:[id number] DOB: [birth year] DOA: 01/22/2023     222 DOS: the patient was seen and examined on 10/21/2023  Hospital course:  58 year old with past medical history significant for bipolar disorder, early onset dementia who was admitted for failure to thrive , increasingly erratic behavior and cognitive impairments in 2023.  She was evaluated by neurology and psychiatry, reversible or treatable cause for her condition could not be found.  It appears that she has a early onset dementia with comorbid bipolar disorder, leading to weight loss and malnutrition and failure to thrive .  For some reason, during that admission, there was a prolonged delay for over a year in placement in memory care until October 2024, and on arriving at that facility, she exhibited agitation behaviors, and so she was denied entry and sent back to the hospital the same day.  She remained in ER holding for 6 weeks until she was transferred upstairs in Nov 2024 and has been on [facility] since.  [name] nonverbal and does not follow commands. [name] currently in enclosed bed for fall precautions. [name] comfort care only awaiting safe discharge plan.  Subjective:  Patient seen and examined.  Nonverbal.  Assessment and plan: Early onset dementia with behavioral disturbance Presentation thought related to progression of underlying neurodegenerative process that manifested itself as early onset dementia/psychiatric disorder..  Supportive care.    Bipolar disorder Severe protein caloric malnutrition Chronic kidney disease stage III Hypernatremia Comfort care only: Continue Ativan  Ensure assistance with meals.     Nutrition Status: Body mass index is 13.32 kg/m.  Nutrition Problem: Severe Malnutrition Etiology: chronic illness Signs/Symptoms: severe fat depletion, severe muscle depletion, energy intake < or equal to 50% for > or equal to 5 days Interventions: Magic cup    DVT  prophylaxis: None Disposition: No family at bedside. Status is: Inpatient Remains inpatient appropriate because: Long length of stay.  Pending safe disposition   Code Status:     Code Status: Limited: Do not attempt resuscitation (DNR) -DNR-LIMITED -Do Not Intubate/DNI    Family Communication: None at bedside   Consultants: Neurology Psychiatry Palliative care   Procedures: None   Physical Exam: Does not respond.  Curled up in a fetal position.   Data Reviewed:   No new labs to review   Vitals:   10/14/23 2115 10/15/23 1617 10/16/23 1730 10/18/23 2002  BP: 131/82 (!) 96/57 105/67 115/65  Pulse: 70 73 73 68  Resp: 18  16 18   Temp: (!) 97.5 F (36.4 C) 97.6 F (36.4 C)  97.9 F (36.6 C)  TempSrc:      SpO2: 100% 100% 99% 99%  Weight:      Height:         Author: Renato Applebaum, MD 10/21/2023 10:52 AM  For on call review [email or web address].

## 2023-10-21 NOTE — Plan of Care (Signed)

## 2023-10-22 NOTE — Progress Notes (Signed)
  Progress Note   Patient: Natalie Brown FMW:[id number] DOB: April 06, 1966 DOA: 01/22/2023     223 DOS: the patient was seen and examined on 10/22/2023  Hospital course:  58 year old with past medical history significant for bipolar disorder, early onset dementia who was admitted for failure to thrive , increasingly erratic behavior and cognitive impairments in 2023.  She was evaluated by neurology and psychiatry, reversible or treatable cause for her condition could not be found.  It appears that she has a early onset dementia with comorbid bipolar disorder, leading to weight loss and malnutrition and failure to thrive .  For some reason, during that admission, there was a prolonged delay for over a year in placement in memory care until October 2024, and on arriving at that facility, she exhibited agitation behaviors, and so she was denied entry and sent back to the hospital the same day.  She remained in ER holding for 6 weeks until she was transferred upstairs in Nov 2024 and has been on [facility] since.  [name] nonverbal and does not follow commands. [name] currently in enclosed bed for fall precautions. [name] comfort care only awaiting safe discharge plan.  Subjective:  Patient seen and examined.  No overnight events.  Does not interact, however drinking water  with the staff.  Staff reported that she walked around.  Assessment and plan: Early onset dementia with behavioral disturbance Presentation thought related to progression of underlying neurodegenerative process that manifested itself as early onset dementia/psychiatric disorder..  Supportive care.    Bipolar disorder Severe protein caloric malnutrition Chronic kidney disease stage III Hypernatremia Comfort care only: Continue Ativan  Ensure assistance with meals.     Nutrition Status: Body mass index is 13.32 kg/m.  Nutrition Problem: Severe Malnutrition Etiology: chronic illness Signs/Symptoms: severe fat depletion, severe muscle  depletion, energy intake < or equal to 50% for > or equal to 5 days Interventions: Magic cup    DVT prophylaxis: None Disposition: No family at bedside. Status is: Inpatient Remains inpatient appropriate because: Long length of stay.  Pending safe disposition   Code Status:     Code Status: Limited: Do not attempt resuscitation (DNR) -DNR-LIMITED -Do Not Intubate/DNI    Family Communication: None at bedside   Consultants: Neurology Psychiatry Palliative care   Procedures: None   Physical Exam: Does not interact.  Looks comfortable.  Drinking water  through the straw.   Data Reviewed:   No new labs to review   Vitals:   10/15/23 1617 10/16/23 1730 10/18/23 2002 10/21/23 2145  BP: (!) 96/57 105/67 115/65 116/68  Pulse: 73 73 68 64  Resp:  16 18 18   Temp: 97.6 F (36.4 C)  97.9 F (36.6 C)   TempSrc:      SpO2: 100% 99% 99% 100%  Weight:      Height:         Author: Renato Applebaum, MD 10/22/2023 10:12 AM  For on call review [email or web address].

## 2023-10-23 NOTE — Progress Notes (Signed)
  Progress Note   Patient: Natalie Brown FMW:[id number] DOB: 1966/04/18 DOA: 01/22/2023     224 DOS: the patient was seen and examined on 10/23/2023  Hospital course: 58 year old with past medical history significant for bipolar disorder, early onset dementia who was admitted for failure to thrive , increasingly erratic behavior and cognitive impairments in 2023.  She was evaluated by neurology and psychiatry, reversible or treatable cause for her condition could not be found.  It appears that she has a early onset dementia with comorbid bipolar disorder, leading to weight loss and malnutrition and failure to thrive .  For some reason, during that admission, there was a prolonged delay for over a year in placement in memory care until October 2024, and on arriving at that facility, she exhibited agitation behaviors, and so she was denied entry and sent back to the hospital the same day.  She remained in ER holding for 6 weeks until she was transferred upstairs in Nov 2024 and has been on [facility] since.  [name] nonverbal and does not follow commands. [name] currently in enclosed bed for fall precautions. [name] comfort care only awaiting safe discharge plan.  Subjective:  Patient seen and examined.  Curled up in the bed.  Does not want to talk.  Assessment and plan: Early onset dementia with behavioral disturbance Presentation thought related to progression of underlying neurodegenerative process that manifested itself as early onset dementia/psychiatric disorder..  Supportive care.    Bipolar disorder Severe protein caloric malnutrition Chronic kidney disease stage III Hypernatremia Comfort care only: Continue Ativan  Ensure assistance with meals.     Nutrition Status: Body mass index is 13.32 kg/m.  Nutrition Problem: Severe Malnutrition Etiology: chronic illness Signs/Symptoms: severe fat depletion, severe muscle depletion, energy intake < or equal to 50% for > or equal to 5  days Interventions: Magic cup    DVT prophylaxis: None Disposition: No family at bedside. Status is: Inpatient Remains inpatient appropriate because: Long length of stay.  Pending safe disposition   Code Status:     Code Status: Limited: Do not attempt resuscitation (DNR) -DNR-LIMITED -Do Not Intubate/DNI    Family Communication: None at bedside   Consultants: Neurology Psychiatry Palliative care   Procedures: None   Physical Exam: Does not interact.  Looks comfortable.  Curled up in the bed.   Data Reviewed:   No new labs to review   Vitals:   10/16/23 1730 10/18/23 2002 10/21/23 2145 10/22/23 1959  BP: 105/67 115/65 116/68 122/64  Pulse: 73 68 64 70  Resp: 16 18 18 18   Temp:  97.9 F (36.6 C)  (!) 97.3 F (36.3 C)  TempSrc:      SpO2: 99% 99% 100% 100%  Weight:      Height:         Author: Renato Applebaum, MD 10/23/2023 1:03 PM  For on call review [email or web address].

## 2023-10-23 NOTE — Plan of Care (Signed)

## 2023-10-24 NOTE — Plan of Care (Signed)
  Problem: Activity: Goal: Risk for activity intolerance will decrease Outcome: Progressing   Problem: Pain Managment: Goal: General experience of comfort will improve and/or be controlled Outcome: Progressing   Problem: Education: Goal: Knowledge of General Education information will improve Description: Including pain rating scale, medication(s)/side effects and non-pharmacologic comfort measures Outcome: Not Progressing

## 2023-10-24 NOTE — TOC Progression Note (Signed)
 Transition of Care [facility]) - Progression Note    Patient Details  Name: SANDRALEE TARKINGTON MRN: [id number] Date of Birth: June 29, 1965  [facility]) CM/SW Contact  Isaiah Public, LCSWA Phone Number: 10/24/2023, 1:19 PM  Clinical Narrative:     No current SNF bed offers.  Expected Discharge Plan: Skilled Nursing Facility Barriers to Discharge: SNF Pending bed offer, Insurance Authorization  Expected Discharge Plan and Services In-house Referral: Clinical Social Work   Post Acute Care Choice: Skilled Nursing Facility Living arrangements for the past 2 months: Assisted Living Facility                                       Social Determinants of Health (SDOH) Interventions SDOH Screenings   Food Insecurity: No Food Insecurity (05/14/2023)  Housing: Low Risk  (01/24/2023)  Transportation Needs: No Transportation Needs (05/14/2023)  Utilities: Not At Risk (05/14/2023)  Alcohol Screen: Low Risk  (09/06/2021)  Depression (PHQ2-9): Medium Risk (05/26/2020)  Tobacco Use: High Risk (04/04/2023)    Readmission Risk Interventions     No data to display

## 2023-10-24 NOTE — Progress Notes (Signed)
  Progress Note   Patient: Natalie Brown FMW:[id number] DOB: 28-Aug-1965 DOA: 01/22/2023     225 DOS: the patient was seen and examined on 10/24/2023  Hospital course: 58 year old with past medical history significant for bipolar disorder, early onset dementia who was admitted for failure to thrive , increasingly erratic behavior and cognitive impairments in 2023.  She was evaluated by neurology and psychiatry, reversible or treatable cause for her condition could not be found.  It appears that she has a early onset dementia with comorbid bipolar disorder, leading to weight loss and malnutrition and failure to thrive .  For some reason, during that admission, there was a prolonged delay for over a year in placement in memory care until October 2024, and on arriving at that facility, she exhibited agitation behaviors, and so she was denied entry and sent back to the hospital the same day.  She remained in ER holding for 6 weeks until she was transferred upstairs in Nov 2024 and has been on [facility] since.  [name] nonverbal and does not follow commands. [name] currently in enclosed bed for fall precautions. [name] comfort care only awaiting safe discharge plan.  10/24/2023: Patient seen alongside patient's mom.  No new changes.  Continue comfort directed care.  No concerns today.  Subjective:  Patient seen and examined.   No history from patient. No new concerns.    Assessment and plan: Early onset dementia with behavioral disturbance Presentation thought related to progression of underlying neurodegenerative process that manifested itself as early onset dementia/psychiatric disorder..  Supportive care. 10/24/2023: Continue comfort directed care  Bipolar disorder Severe protein caloric malnutrition Chronic kidney disease stage III Hypernatremia Comfort care only: Continue Ativan  Ensure assistance with meals.     Nutrition Status: Body mass index is 13.32 kg/m.  Nutrition Problem: Severe  Malnutrition Etiology: chronic illness Signs/Symptoms: severe fat depletion, severe muscle depletion, energy intake < or equal to 50% for > or equal to 5 days Interventions: Magic cup    DVT prophylaxis: None Disposition: No family at bedside. Status is: Inpatient Remains inpatient appropriate because: Long length of stay.  Pending safe disposition   Code Status:     Code Status: Limited: Do not attempt resuscitation (DNR) -DNR-LIMITED -Do Not Intubate/DNI    Family Communication: None at bedside   Consultants: Neurology Psychiatry Palliative care   Procedures: None   Physical Exam: Does not interact.  Looks comfortable.  Curled up in the bed.   Data Reviewed:   No new labs to review   Vitals:   10/18/23 2002 10/21/23 2145 10/22/23 1959 10/23/23 2007  BP: 115/65 116/68 122/64 97/65  Pulse: 68 64 70 79  Resp: 18 18 18 18   Temp: 97.9 F (36.6 C)  (!) 97.3 F (36.3 C) 98.4 F (36.9 C)  TempSrc:      SpO2: 99% 100% 100% 98%  Weight:      Height:         Author: Leatrice LILLETTE Chapel, MD 10/24/2023 4:57 PM  For on call review [email or web address].

## 2023-10-25 NOTE — Plan of Care (Signed)
   Problem: Activity: Goal: Risk for activity intolerance will decrease Outcome: Progressing   Problem: Coping: Goal: Level of anxiety will decrease Outcome: Progressing   Problem: Pain Managment: Goal: General experience of comfort will improve and/or be controlled Outcome: Progressing

## 2023-10-25 NOTE — Progress Notes (Signed)
  Progress Note   Patient: Natalie Brown FMW:[id number] DOB: [birth year] DOA: 01/22/2023     226 DOS: the patient was seen and examined on 10/25/2023  Hospital course: Patient is a 58 year old female with past medical history significant for bipolar disorder and early onset dementia.  Patient was admitted with failure to thrive , increasingly erratic behavior and cognitive impairments.  Patient was evaluated by neurology and psychiatry teams for possible reversible or treatable cause of her condition to no avail.  As documented above, there are concerns for early onset dementia with comorbid bipolar disorder, leading to weight loss and malnutrition and failure to thrive .  For some reason, during that admission, there was a prolonged delay for over a year in placement in memory care until October 2024, and on arriving at the facility, patient exhibited agitated behaviors, and patient was denied entry and sent back to the hospital the same day.  Patient remained in ER holding for 6 weeks until patient was transferred upstairs in Nov 2024 and has been on [facility] since.  Patient is nonverbal and does not follow commands. [name] currently in an enclosed bed for fall precautions.  Goal of care at the moment is comfort directed care.  Patient is awaiting disposition.    10/25/2023: Patient seen.  No new changes.  Continue comfort directed care.  No new concerns.  Subjective:  Patient seen and examined.   No history from patient. No new concerns.    Assessment and plan: Early onset dementia with behavioral disturbance Presentation thought to be related to progression of underlying neurodegenerative process that manifested itself as early onset dementia/psychiatric disorder..  Supportive care. 10/25/2023: Continue comfort directed care  Bipolar disorder Severe protein caloric malnutrition Chronic kidney disease stage III Hypernatremia Comfort care only: Continue Ativan  Ensure assistance with meals.      Nutrition Status: Body mass index is 13.32 kg/m.  Nutrition Problem: Severe Malnutrition Etiology: chronic illness Signs/Symptoms: severe fat depletion, severe muscle depletion, energy intake < or equal to 50% for > or equal to 5 days Interventions: Magic cup    DVT prophylaxis: None Disposition: No family at bedside. Status is: Inpatient Remains inpatient appropriate because: Long length of stay.  Pending safe disposition   Code Status:     Code Status: Limited: Do not attempt resuscitation (DNR) -DNR-LIMITED -Do Not Intubate/DNI    Family Communication: None at bedside   Consultants: Neurology Psychiatry Palliative care   Procedures: None   Physical Exam: Does not interact.  Looks comfortable.  Curled up in the bed.   Data Reviewed:   No new labs to review   Vitals:   10/21/23 2145 10/22/23 1959 10/23/23 2007 10/24/23 2200  BP: 116/68 122/64 97/65 (!) 96/48  Pulse: 64 70 79 62  Resp: 18 18 18 18   Temp:  (!) 97.3 F (36.3 C) 98.4 F (36.9 C) 98 F (36.7 C)  TempSrc:    Axillary  SpO2: 100% 100% 98% 99%  Weight:      Height:         Author: Leatrice LILLETTE Chapel, MD 10/25/2023 2:58 PM  For on call review [email or web address].

## 2023-10-26 NOTE — Progress Notes (Signed)
  Progress Note   Patient: Natalie Brown FMW:[id number] DOB: [birth year] DOA: 01/22/2023     227 DOS: the patient was seen and examined on 10/26/2023  Hospital course: Patient is a 58 year old female with past medical history significant for bipolar disorder and early onset dementia.  Patient was admitted with failure to thrive , increasingly erratic behavior and cognitive impairments.  Patient was evaluated by neurology and psychiatry teams for possible reversible or treatable cause of her condition to no avail.  As documented above, there are concerns for early onset dementia with comorbid bipolar disorder, leading to weight loss and malnutrition and failure to thrive .  For some reason, during that admission, there was a prolonged delay for over a year in placement in memory care until October 2024, and on arriving at the facility, patient exhibited agitated behaviors, and patient was denied entry and sent back to the hospital the same day.  Patient remained in ER holding for 6 weeks until patient was transferred upstairs in Nov 2024 and has been on [facility] since.  Patient is nonverbal and does not follow commands. [name] currently in an enclosed bed for fall precautions.  Goal of care at the moment is comfort directed care.  Patient is awaiting disposition.    10/26/2023: Patient seen.  No new reports.  Continue comfort directed care.  No new concerns.  Subjective:  Patient seen and examined.   No history from patient. No new concerns.    Assessment and plan: Early onset dementia with behavioral disturbance Presentation thought to be related to progression of underlying neurodegenerative process that manifested itself as early onset dementia/psychiatric disorder..  Supportive care. 10/26/2023: Continue comfort directed care  Bipolar disorder Severe protein caloric malnutrition Chronic kidney disease stage III Hypernatremia Comfort care only: Continue Ativan  Ensure assistance with meals.      Nutrition Status: Body mass index is 13.32 kg/m.  Nutrition Problem: Severe Malnutrition Etiology: chronic illness Signs/Symptoms: severe fat depletion, severe muscle depletion, energy intake < or equal to 50% for > or equal to 5 days Interventions: Magic cup    DVT prophylaxis: None Disposition: No family at bedside. Status is: Inpatient Remains inpatient appropriate because: Long length of stay.  Pending safe disposition   Code Status:     Code Status: Limited: Do not attempt resuscitation (DNR) -DNR-LIMITED -Do Not Intubate/DNI    Family Communication: None at bedside   Consultants: Neurology Psychiatry Palliative care   Procedures: None   Physical Exam: Looks comfortable.     Data Reviewed:   No new labs to review   Vitals:   10/23/23 2007 10/24/23 2200 10/25/23 1537 10/26/23 0700  BP: 97/65 (!) 96/48 108/66 108/67  Pulse: 79 62 78 60  Resp: 18 18 18    Temp: 98.4 F (36.9 C) 98 F (36.7 C) 98.1 F (36.7 C) 97.9 F (36.6 C)  TempSrc:  Axillary  Oral  SpO2: 98% 99%  97%  Weight:      Height:         Author: Leatrice LILLETTE Chapel, MD 10/26/2023 11:11 AM  For on call review [email or web address].

## 2023-10-27 NOTE — Plan of Care (Signed)

## 2023-10-27 NOTE — Progress Notes (Signed)
  Progress Note   Patient: Natalie Brown FMW:[id number] DOB: Jul 17, 1965 DOA: 01/22/2023     228 DOS: the patient was seen and examined on 10/27/2023  Hospital course: Patient is a 58 year old female with past medical history significant for bipolar disorder and early onset dementia.  Patient was admitted with failure to thrive , increasingly erratic behavior and cognitive impairments.  Patient was evaluated by neurology and psychiatry teams for possible reversible or treatable cause of her condition to no avail.  As documented above, there are concerns for early onset dementia with comorbid bipolar disorder, leading to weight loss and malnutrition and failure to thrive .  For some reason, during that admission, there was a prolonged delay for over a year in placement in memory care until October 2024, and on arriving at the facility, patient exhibited agitated behaviors, and patient was denied entry and sent back to the hospital the same day.  Patient remained in ER holding for 6 weeks until patient was transferred upstairs in Nov 2024 and has been on [facility] since.  Patient is nonverbal and does not follow commands. [name] currently in an enclosed bed for fall precautions.  Goal of care at the moment is comfort directed care.  Patient is awaiting disposition.    10/27/2023: Patient seen.  No new reports.  Continue comfort directed care.  No new concerns.  Subjective:  Patient seen and examined.   No history from patient. No new concerns.    Assessment and plan: Early onset dementia with behavioral disturbance Presentation thought to be related to progression of underlying neurodegenerative process that manifested itself as early onset dementia/psychiatric disorder..  Supportive care. 10/26/2023: Continue comfort directed care  Bipolar disorder Severe protein caloric malnutrition Chronic kidney disease stage III Hypernatremia Comfort care only: Continue Ativan  Ensure assistance with meals.      Nutrition Status: Body mass index is 13.32 kg/m.  Nutrition Problem: Severe Malnutrition Etiology: chronic illness Signs/Symptoms: severe fat depletion, severe muscle depletion, energy intake < or equal to 50% for > or equal to 5 days Interventions: Magic cup    DVT prophylaxis: None Disposition: No family at bedside. Status is: Inpatient Remains inpatient appropriate because: Long length of stay.  Pending safe disposition   Code Status:     Code Status: Limited: Do not attempt resuscitation (DNR) -DNR-LIMITED -Do Not Intubate/DNI    Family Communication: None at bedside   Consultants: Neurology Psychiatry Palliative care   Procedures: None   Physical Exam: Looks comfortable.     Data Reviewed:   No new labs to review   Vitals:   10/23/23 2007 10/24/23 2200 10/25/23 1537 10/26/23 0700  BP: 97/65 (!) 96/48 108/66 108/67  Pulse: 79 62 78 60  Resp: 18 18 18    Temp: 98.4 F (36.9 C) 98 F (36.7 C) 98.1 F (36.7 C) 97.9 F (36.6 C)  TempSrc:  Axillary  Oral  SpO2: 98% 99%  97%  Weight:      Height:         Author: Leatrice LILLETTE Chapel, MD 10/27/2023 5:05 PM  For on call review [email or web address].

## 2023-10-28 NOTE — Progress Notes (Signed)
  Progress Note   Patient: Natalie Brown FMW:[id number] DOB: 03-Mar-1966 DOA: 01/22/2023     229 DOS: the patient was seen and examined on 10/28/2023  Hospital course: 58 year old with past medical history significant for bipolar disorder, early onset dementia who was admitted for failure to thrive , increasingly erratic behavior and cognitive impairments in 2023.  She was evaluated by neurology and psychiatry, reversible or treatable cause for her condition could not be found.  It appears that she has a early onset dementia with comorbid bipolar disorder, leading to weight loss and malnutrition and failure to thrive .  For some reason, during that admission, there was a prolonged delay for over a year in placement in memory care until October 2024, and on arriving at that facility, she exhibited agitation behaviors, and so she was denied entry and sent back to the hospital the same day.  She remained in ER holding for 6 weeks until she was transferred upstairs in Nov 2024 and has been on [facility] since.  [name] nonverbal and does not follow commands. [name] currently in enclosed bed for fall precautions. [name] comfort care only awaiting safe discharge plan.  Subjective: Seen examined Non verbal Remains on comfort care, BP stable afebrile  Assessment and plan: Early onset dementia with behavioral disturbance Presentation thought related to progression of underlying neurodegenerative process that manifested itself as early onset dementia/psychiatric disorder..  Supportive care.  Palliative care on board.  Bipolar disorder Severe protein caloric malnutrition Chronic kidney disease stage III Hypernatremia Comfort care only: Continue Ativan  Ensure assistance with meals.     Nutrition Status: Body mass index is 13.32 kg/m.  Nutrition Problem: Severe Malnutrition Etiology: chronic illness Signs/Symptoms: severe fat depletion, severe muscle depletion, energy intake < or equal to 50% for > or equal  to 5 days Interventions: Magic cup   DVT prophylaxis: None Disposition: No family at bedside. Status is: Inpatient Remains inpatient appropriate because: Long length of stay.  Pending safe disposition   Code Status:     Code Status: Limited: Do not attempt resuscitation (DNR) -DNR-LIMITED -Do Not Intubate/DNI    Family Communication: None at bedside   Consultants: Neurology Psychiatry Palliative care   Procedures: None   Physical Exam: Deferred full exam for comfort currently in enclosed bed Eyes closed moving spontaneously  Data Reviewed:   No new labs to review   Vitals:   10/25/23 1537 10/26/23 0700 10/27/23 1725 10/27/23 1953  BP:  108/67 (!) 95/57 96/64  Pulse:  60 96 90  Resp: 18  18 18   Temp:  97.9 F (36.6 C) 97.9 F (36.6 C) 97.8 F (36.6 C)  TempSrc:  Oral Oral   SpO2:  97% 100% 98%  Weight:      Height:         Author: Mennie LAMY, MD 10/28/2023 11:33 AM  For on call review [email or web address].

## 2023-10-29 NOTE — Progress Notes (Signed)
  Progress Note   Patient: Natalie Brown FMW:[id number] DOB: [birth year] DOA: 01/22/2023     230 DOS: the patient was seen and examined on 10/29/2023  Hospital course: 58 year old with past medical history significant for bipolar disorder, early onset dementia who was admitted for failure to thrive , increasingly erratic behavior and cognitive impairments in 2023.  She was evaluated by neurology and psychiatry, reversible or treatable cause for her condition could not be found.  It appears that she has a early onset dementia with comorbid bipolar disorder, leading to weight loss and malnutrition and failure to thrive .  For some reason, during that admission, there was a prolonged delay for over a year in placement in memory care until October 2024, and on arriving at that facility, she exhibited agitation behaviors, and so she was denied entry and sent back to the hospital the same day.  She remained in ER holding for 6 weeks until she was transferred upstairs in Nov 2024 and has been on [facility] since.  [name] nonverbal and does not follow commands. [name] currently in enclosed bed for fall precautions. [name] comfort care only awaiting safe discharge plan.  Subjective: Patient seen and examined,  She remains in enclosure bed in the fetal position intermittently moving  Non verbal  Assessment and plan: Early onset dementia with behavioral disturbance Presentation thought related to progression of underlying neurodegenerative process that manifested itself as early onset dementia/psychiatric disorder..  Supportive care.  Palliative care on board.  Bipolar disorder Severe protein caloric malnutrition Chronic kidney disease stage III Hypernatremia Comfort care only: Continue Ativan  Ensure assistance with meals.     Nutrition Status: Body mass index is 13.32 kg/m.  Nutrition Problem: Severe Malnutrition Etiology: chronic illness Signs/Symptoms: severe fat depletion, severe muscle depletion,  energy intake < or equal to 50% for > or equal to 5 days Interventions: Magic cup   DVT prophylaxis: None Disposition: No family at bedside. Status is: Inpatient Remains inpatient appropriate because: Long length of stay.  Pending safe disposition   Code Status:     Code Status: Limited: Do not attempt resuscitation (DNR) -DNR-LIMITED -Do Not Intubate/DNI    Family Communication: None at bedside   Consultants: Neurology Psychiatry Palliative care   Procedures: None   Physical Exam: In enclosed bed, intermittently moving, in the fetal position  Full exam deferred for comfort   Data Reviewed:   No new labs to review   Vitals:   10/27/23 1725 10/27/23 1953 10/28/23 1604 10/28/23 2013  BP: (!) 95/57 96/64 (!) 149/115 116/66  Pulse: 96 90 93 90  Resp: 18 18  18   Temp: 97.9 F (36.6 C) 97.8 F (36.6 C)  98.1 F (36.7 C)  TempSrc: Oral   Oral  SpO2: 100% 98% 99% 99%  Weight:      Height:         Author: Mennie LAMY, MD 10/29/2023 10:37 AM  For on call review [email or web address].

## 2023-10-30 NOTE — Progress Notes (Signed)
  Progress Note   Patient: Natalie Brown FMW:[id number] DOB: Dec 18, 1965 DOA: 01/22/2023     231 DOS: the patient was seen and examined on 10/30/2023  Hospital course: 58 year old with past medical history significant for bipolar disorder, early onset dementia who was admitted for failure to thrive , increasingly erratic behavior and cognitive impairments in 2023.  She was evaluated by neurology and psychiatry, reversible or treatable cause for her condition could not be found.  It appears that she has a early onset dementia with comorbid bipolar disorder, leading to weight loss and malnutrition and failure to thrive .  For some reason, during that admission, there was a prolonged delay for over a year in placement in memory care until October 2024, and on arriving at that facility, she exhibited agitation behaviors, and so she was denied entry and sent back to the hospital the same day.  She remained in ER holding for 6 weeks until she was transferred upstairs in Nov 2024 and has been on [facility] since.  [name] nonverbal and does not follow commands. [name] currently in enclosed bed for fall precautions. [name] comfort care only awaiting safe discharge plan.  Subjective: Seen and examined  Remains on any close bed in fetal position  Moves extremities on touch  Non verbal  Assessment and plan: Early onset dementia with behavioral disturbance Presentation thought related to progression of underlying neurodegenerative process that manifested itself as early onset dementia/psychiatric disorder..  Supportive care.  Palliative care on board.  Bipolar disorder Severe protein caloric malnutrition Chronic kidney disease stage III Hypernatremia Comfort care only: Continue Ativan  Ensure assistance with meals.     Nutrition Status: Body mass index is 13.32 kg/m.  Nutrition Problem: Severe Malnutrition Etiology: chronic illness Signs/Symptoms: severe fat depletion, severe muscle depletion, energy intake  < or equal to 50% for > or equal to 5 days Interventions: Magic cup   DVT prophylaxis: None Disposition: No family at bedside. Status is: Inpatient Remains inpatient appropriate because: Long length of stay.  Pending safe disposition   Code Status:     Code Status: Limited: Do not attempt resuscitation (DNR) -DNR-LIMITED -Do Not Intubate/DNI    Family Communication: None at bedside   Consultants: Neurology Psychiatry Palliative care   Procedures: None   Physical Exam: In fetal position  Moving intermittently  Appears comfortable- full exam deferred for comfort   Data Reviewed:   No new labs to review   Vitals:   10/27/23 1953 10/28/23 1604 10/28/23 2013 10/29/23 2052  BP: 96/64 (!) 149/115 116/66 116/85  Pulse: 90 93 90 (!) 58  Resp: 18  18 18   Temp: 97.8 F (36.6 C)  98.1 F (36.7 C) 98.1 F (36.7 C)  TempSrc:   Oral   SpO2: 98% 99% 99% 100%  Weight:      Height:         Author: Mennie LAMY, MD 10/30/2023 10:00 AM  For on call review [email or web address].

## 2023-10-30 NOTE — TOC Progression Note (Signed)
 Transition of Care [facility]) - Progression Note    Patient Details  Name: Natalie Brown MRN: [id number] Date of Birth: 03/21/1966  [facility] CM/SW Contact  Rosaline JONELLE Joe, RN Phone Number: 10/30/2023, 3:38 PM  Clinical Narrative:    Patient remain in the hospital with barriers to placement.  Patient's mother is unable to provide care/shelter for the patient.  Patient remains in enclosure bed - No SNF offers available for placement and patient remains difficult to place.  Hospital leadership team is aware and continues to follow.   Expected Discharge Plan: Skilled Nursing Facility Barriers to Discharge: SNF Pending bed offer, Insurance Authorization  Expected Discharge Plan and Services In-house Referral: Clinical Social Work   Post Acute Care Choice: Skilled Nursing Facility Living arrangements for the past 2 months: Assisted Living Facility                                       Social Determinants of Health (SDOH) Interventions SDOH Screenings   Food Insecurity: No Food Insecurity (05/14/2023)  Housing: Low Risk  (01/24/2023)  Transportation Needs: No Transportation Needs (05/14/2023)  Utilities: Not At Risk (05/14/2023)  Alcohol Screen: Low Risk  (09/06/2021)  Depression (PHQ2-9): Medium Risk (05/26/2020)  Tobacco Use: High Risk (04/04/2023)    Readmission Risk Interventions     No data to display

## 2023-10-31 NOTE — Progress Notes (Signed)
  Progress Note   Patient: Natalie Brown FMW:[id number] DOB: 10-Jul-1965 DOA: 01/22/2023     232 DOS: the patient was seen and examined on 10/31/2023  Hospital course: 58 year old with past medical history significant for bipolar disorder, early onset dementia who was admitted for failure to thrive , increasingly erratic behavior and cognitive impairments in 2023.  She was evaluated by neurology and psychiatry, reversible or treatable cause for her condition could not be found.  It appears that she has a early onset dementia with comorbid bipolar disorder, leading to weight loss and malnutrition and failure to thrive .  For some reason, during that admission, there was a prolonged delay for over a year in placement in memory care until October 2024, and on arriving at that facility, she exhibited agitation behaviors, and so she was denied entry and sent back to the hospital the same day.  She remained in ER holding for 6 weeks until she was transferred upstairs in Nov 2024 and has been on [facility] since.  [name] nonverbal and does not follow commands. [name] currently in enclosed bed for fall precautions. [name] comfort care only awaiting safe discharge plan.  Subjective: Patient seen and examined  In enclosed bed, in fetal position Non verbal  Assessment and plan: Early onset dementia with behavioral disturbance Presentation thought related to progression of underlying neurodegenerative process that manifested itself as early onset dementia/psychiatric disorder..  Supportive care.  Palliative care on board.  Bipolar disorder Severe protein caloric malnutrition Chronic kidney disease stage III Hypernatremia Comfort care only: Continue Ativan  Ensure assistance with meals.     Nutrition Status: Body mass index is 13.32 kg/m.  Nutrition Problem: Severe Malnutrition Etiology: chronic illness Signs/Symptoms: severe fat depletion, severe muscle depletion, energy intake < or equal to 50% for > or  equal to 5 days Interventions: Magic cup   DVT prophylaxis: None Disposition: No family at bedside. Status is: Inpatient Remains inpatient appropriate because: Long length of stay.  Pending safe disposition   Code Status:     Code Status: Limited: Do not attempt resuscitation (DNR) -DNR-LIMITED -Do Not Intubate/DNI    Family Communication: None at bedside   Consultants: Neurology Psychiatry Palliative care   Procedures: None   Physical Exam: In fetal position  In the enclosed bed Moaning intermittently does not follow commands nonverbal full exam deferred for comfort   Data Reviewed:   No new labs to review   Vitals:   10/29/23 2052 10/30/23 1100 10/31/23 0439 10/31/23 0809  BP: 116/85 91/66 102/79 110/81  Pulse: (!) 58  90 93  Resp: 18 19 18    Temp: 98.1 F (36.7 C) 98.1 F (36.7 C) 98.1 F (36.7 C) 97.9 F (36.6 C)  TempSrc:  Axillary    SpO2: 100%  93% 94%  Weight:      Height:       Author: Mennie LAMY, MD 10/31/2023 11:37 AM  For on call review [email or web address].

## 2023-11-01 NOTE — Progress Notes (Signed)
 Pt agitated & refused OOB(Enclosure Bed)/ambulate this AM. Will continue to monitor.

## 2023-11-01 NOTE — Plan of Care (Signed)
  Problem: Activity: Goal: Risk for activity intolerance will decrease Outcome: Progressing   Problem: Education: Goal: Knowledge of General Education information will improve Description: Including pain rating scale, medication(s)/side effects and non-pharmacologic comfort measures Outcome: Not Progressing   Problem: Coping: Goal: Level of anxiety will decrease Outcome: Not Progressing   Problem: Skin Integrity: Goal: Risk for impaired skin integrity will decrease Outcome: Not Progressing

## 2023-11-01 NOTE — Progress Notes (Signed)
  Progress Note   Patient: Natalie Brown FMW:[id number] DOB: June 15, 1965 DOA: 01/22/2023     233 DOS: the patient was seen and examined on 11/01/2023  Hospital course: 58 year old with past medical history significant for bipolar disorder, early onset dementia who was admitted for failure to thrive , increasingly erratic behavior and cognitive impairments in 2023.  She was evaluated by neurology and psychiatry, reversible or treatable cause for her condition could not be found.  It appears that she has a early onset dementia with comorbid bipolar disorder, leading to weight loss and malnutrition and failure to thrive .  For some reason, during that admission, there was a prolonged delay for over a year in placement in memory care until October 2024, and on arriving at that facility, she exhibited agitation behaviors, and so she was denied entry and sent back to the hospital the same day.  She remained in ER holding for 6 weeks until she was transferred upstairs in Nov 2024 and has been on [facility] since.  [name] nonverbal and does not follow commands. [name] currently in enclosed bed for fall precautions. [name] comfort care only awaiting safe discharge plan.  Subjective: Patient seen this am On enclosed bed, fetal position. Non verbal unchanged   Assessment and plan: Early onset dementia with behavioral disturbance Presentation thought related to progression of underlying neurodegenerative process that manifested itself as early onset dementia/psychiatric disorder..  Supportive care.  Palliative care on board.  Bipolar disorder Severe protein caloric malnutrition Chronic kidney disease stage III Hypernatremia Comfort care only: Continue Ativan  Ensure assistance with meals.     Nutrition Status: Body mass index is 13.32 kg/m.  Nutrition Problem: Severe Malnutrition Etiology: chronic illness Signs/Symptoms: severe fat depletion, severe muscle depletion, energy intake < or equal to 50% for > or  equal to 5 days Interventions: Magic cup   DVT prophylaxis: None Disposition: No family at bedside. Status is: Inpatient Remains inpatient appropriate because: Long length of stay.  Pending safe disposition   Code Status:     Code Status: Limited: Do not attempt resuscitation (DNR) -DNR-LIMITED -Do Not Intubate/DNI    Family Communication: None at bedside   Consultants: Neurology Psychiatry Palliative care   Procedures: None   Physical Exam: Patient sleeping, in fetal position  In the enclosed bed full exam deferred for comfort   Data Reviewed:   No new labs to review   Vitals:   10/30/23 1100 10/31/23 0439 10/31/23 0809 11/01/23 0500  BP: 91/66 102/79 110/81 109/78  Pulse:  90 93 93  Resp: 19 18  19   Temp: 98.1 F (36.7 C) 98.1 F (36.7 C) 97.9 F (36.6 C) 97.7 F (36.5 C)  TempSrc: Axillary   Oral  SpO2:  93% 94% 98%  Weight:      Height:       Author: Mennie LAMY, MD 11/01/2023 9:54 AM  For on call review [email or web address].

## 2023-11-02 NOTE — Progress Notes (Signed)
  Progress Note   Patient: Natalie Brown FMW:[id number] DOB: Jul 30, 1965 DOA: 01/22/2023     234 DOS: the patient was seen and examined on 11/02/2023  Hospital course: 58 year old with past medical history significant for bipolar disorder, early onset dementia who was admitted for failure to thrive , increasingly erratic behavior and cognitive impairments in 2023.  She was evaluated by neurology and psychiatry, reversible or treatable cause for her condition could not be found.  It appears that she has a early onset dementia with comorbid bipolar disorder, leading to weight loss and malnutrition and failure to thrive .  For some reason, during that admission, there was a prolonged delay for over a year in placement in memory care until October 2024, and on arriving at that facility, she exhibited agitation behaviors, and so she was denied entry and sent back to the hospital the same day.  She remained in ER holding for 6 weeks until she was transferred upstairs in Nov 2024 and has been on [facility] since.  [name] nonverbal and does not follow commands. [name] currently in enclosed bed for fall precautions. [name] comfort care only awaiting safe discharge plan.  Subjective: Patient seen and examined On enclosed bed, fetal position. Non verbal unchanged  Blood pressure has been stable  Assessment and plan: Early onset dementia with behavioral disturbance Presentation thought related to progression of underlying neurodegenerative process that manifested itself as early onset dementia/psychiatric disorder..  Supportive care.  Palliative care on board.  Bipolar disorder Severe protein caloric malnutrition Chronic kidney disease stage III Hypernatremia Comfort care only: Continue Ativan  Ensure assistance with meals.     Nutrition Status: Body mass index is 13.32 kg/m.  Nutrition Problem: Severe Malnutrition Etiology: chronic illness Signs/Symptoms: severe fat depletion, severe muscle depletion,  energy intake < or equal to 50% for > or equal to 5 days Interventions: Magic cup   DVT prophylaxis: None Disposition: No family at bedside. Status is: Inpatient Remains inpatient appropriate because: Long length of stay.  Pending safe disposition   Code Status:     Code Status: Limited: Do not attempt resuscitation (DNR) -DNR-LIMITED -Do Not Intubate/DNI    Family Communication: None at bedside   Consultants: Neurology Psychiatry Palliative care   Procedures: None   Physical Exam: Patient sleeping Exam deferred for comfort, remains in fetal position nonverbal not following commands  Data Reviewed:   No new labs to review   Vitals:   10/31/23 0439 10/31/23 0809 11/01/23 0500 11/01/23 2003  BP: 102/79 110/81 109/78 96/67  Pulse: 90 93 93 76  Resp: 18  19 18   Temp: 98.1 F (36.7 C) 97.9 F (36.6 C) 97.7 F (36.5 C) 97.6 F (36.4 C)  TempSrc:   Oral Oral  SpO2: 93% 94% 98% 100%  Weight:      Height:       Author: Mennie LAMY, MD 11/02/2023 11:53 AM  For on call review [email or web address].

## 2023-11-02 NOTE — Plan of Care (Signed)
  Problem: Pain Managment: Goal: General experience of comfort will improve and/or be controlled Outcome: Progressing   Problem: Education: Goal: Knowledge of General Education information will improve Description: Including pain rating scale, medication(s)/side effects and non-pharmacologic comfort measures Outcome: Not Progressing   Problem: Coping: Goal: Level of anxiety will decrease Outcome: Not Progressing

## 2023-11-03 NOTE — Plan of Care (Signed)
   Problem: Education: Goal: Knowledge of General Education information will improve Description Including pain rating scale, medication(s)/side effects and non-pharmacologic comfort measures Outcome: Progressing

## 2023-11-03 NOTE — Progress Notes (Signed)
  Progress Note   Patient: Natalie Brown FMW:[id number] DOB: Feb 04, 1966 DOA: 01/22/2023     235 DOS: the patient was seen and examined on 11/03/2023  Hospital course: 58 year old with past medical history significant for bipolar disorder, early onset dementia who was admitted for failure to thrive , increasingly erratic behavior and cognitive impairments in 2023.  She was evaluated by neurology and psychiatry, reversible or treatable cause for her condition could not be found.  It appears that she has a early onset dementia with comorbid bipolar disorder, leading to weight loss and malnutrition and failure to thrive .  For some reason, during that admission, there was a prolonged delay for over a year in placement in memory care until October 2024, and on arriving at that facility, she exhibited agitation behaviors, and so she was denied entry and sent back to the hospital the same day.  She remained in ER holding for 6 weeks until she was transferred upstairs in Nov 2024 and has been on [facility] since.  [name] nonverbal and does not follow commands. [name] currently in enclosed bed for fall precautions. [name] comfort care only awaiting safe discharge plan.  Subjective: Patient seen Being fed by nursing This morning No acute events Remains in bed ebclosure. Overall clinical picture unchanged  Assessment and plan: Early onset dementia with behavioral disturbance Presentation thought related to progression of underlying neurodegenerative process that manifested itself as early onset dementia/psychiatric disorder..  Supportive care.  Palliative care on board.  Bipolar disorder Severe protein caloric malnutrition Chronic kidney disease stage III Hypernatremia Comfort care only: Continue Ativan  Ensure assistance with meals.     Nutrition Status: Body mass index is 13.32 kg/m.  Nutrition Problem: Severe Malnutrition Etiology: chronic illness Signs/Symptoms: severe fat depletion, severe muscle  depletion, energy intake < or equal to 50% for > or equal to 5 days Interventions: Magic cup   DVT prophylaxis: None Disposition: No family at bedside. Status is: Inpatient Remains inpatient appropriate because: Long length of stay.  Pending safe disposition   Code Status:     Code Status: Limited: Do not attempt resuscitation (DNR) -DNR-LIMITED -Do Not Intubate/DNI    Family Communication: None at bedside   Consultants: Neurology Psychiatry Palliative care   Procedures: None   Physical Exam: Patient eating with assistance in the enclosed bed emains in fetal position nonverbal not following commands  Data Reviewed:   No new labs to review   Vitals:   11/01/23 0500 11/01/23 2003 11/02/23 1100 11/02/23 2034  BP: 109/78 96/67 106/78 108/76  Pulse: 93 76 74 74  Resp: 19 18 18 18   Temp: 97.7 F (36.5 C) 97.6 F (36.4 C) 98 F (36.7 C)   TempSrc: Oral Oral Oral   SpO2: 98% 100%  100%  Weight:      Height:       Author: Mennie LAMY, MD 11/03/2023 9:54 AM  For on call review [email or web address].

## 2023-11-03 NOTE — Plan of Care (Signed)
  Problem: Activity: Goal: Risk for activity intolerance will decrease Outcome: Progressing   Problem: Education: Goal: Knowledge of General Education information will improve Description: Including pain rating scale, medication(s)/side effects and non-pharmacologic comfort measures Outcome: Not Progressing   Problem: Pain Managment: Goal: General experience of comfort will improve and/or be controlled Outcome: Not Progressing

## 2023-11-03 NOTE — Progress Notes (Addendum)
 Pt noted to be resting quietly w/ eyes closed; ambulation deferred at this time. S/P IM Ativan  PRN d/t significant anxiety & agitation/MD aware. Monitoring.

## 2023-11-04 NOTE — Plan of Care (Signed)

## 2023-11-04 NOTE — Progress Notes (Signed)
  Progress Note   Patient: Natalie Brown FMW:[id number] DOB: January 02, 1966 DOA: 01/22/2023     236 DOS: the patient was seen and examined on 11/04/2023  Hospital course: 58 year old with past medical history significant for bipolar disorder, early onset dementia who was admitted for failure to thrive , increasingly erratic behavior and cognitive impairments in 2023.  She was evaluated by neurology and psychiatry, reversible or treatable cause for her condition could not be found.  It appears that she has a early onset dementia with comorbid bipolar disorder, leading to weight loss and malnutrition and failure to thrive .  For some reason, during that admission, there was a prolonged delay for over a year in placement in memory care until October 2024, and on arriving at that facility, she exhibited agitation behaviors, and so she was denied entry and sent back to the hospital the same day.  She remained in ER holding for 6 weeks until she was transferred upstairs in Nov 2024 and has been on [facility] since.  [name] nonverbal and does not follow commands. [name] currently in enclosed bed for fall precautions. [name] comfort care only awaiting safe discharge plan.  Subjective: Patient seen this am No acute events.   Remains in bed enclosure. Overall clinical picture unchanged  Assessment and plan: Early onset dementia with behavioral disturbance Presentation thought related to progression of underlying neurodegenerative process that manifested itself as early onset dementia/psychiatric disorder..  Supportive care.  Palliative care on board.  Bipolar disorder Severe protein caloric malnutrition Chronic kidney disease stage III Hypernatremia Comfort care only: Continue Ativan  Ensure assistance with meals.     Nutrition Status: Body mass index is 13.32 kg/m.  Nutrition Problem: Severe Malnutrition Etiology: chronic illness Signs/Symptoms: severe fat depletion, severe muscle depletion, energy intake  < or equal to 50% for > or equal to 5 days Interventions: Magic cup   DVT prophylaxis: None Disposition: No family at bedside. Status is: Inpatient Remains inpatient appropriate because: Long length of stay.  Pending safe disposition   Code Status:     Code Status: Limited: Do not attempt resuscitation (DNR) -DNR-LIMITED -Do Not Intubate/DNI    Family Communication: None at bedside   Consultants: Neurology Psychiatry Palliative care   Procedures: None   Physical Exam: Remains in enclosure bed.   Nonverbal not following commands [name] able to feed herself some time mostly she is being fed Data Reviewed:   No new labs to review   Vitals:   11/01/23 2003 11/02/23 1100 11/02/23 2034 11/03/23 2135  BP: 96/67 106/78 108/76 99/70  Pulse: 76 74 74 70  Resp: 18 18 18 16   Temp: 97.6 F (36.4 C) 98 F (36.7 C)  98.9 F (37.2 C)  TempSrc: Oral Oral  Oral  SpO2: 100%  100% 98%  Weight:      Height:       Author: Mennie LAMY, MD 11/04/2023 10:53 AM  For on call review [email or web address].

## 2023-11-05 NOTE — Progress Notes (Signed)
  Progress Note   Patient: Natalie Brown FMW:[id number] DOB: 09/15/1965 DOA: 01/22/2023     237 DOS: the patient was seen and examined on 11/05/2023  Hospital course: 58 year old with past medical history significant for bipolar disorder, early onset dementia who was admitted for failure to thrive , increasingly erratic behavior and cognitive impairments in 2023.  She was evaluated by neurology and psychiatry, reversible or treatable cause for her condition could not be found.  It appears that she has a early onset dementia with comorbid bipolar disorder, leading to weight loss and malnutrition and failure to thrive .  For some reason, during that admission, there was a prolonged delay for over a year in placement in memory care until October 2024, and on arriving at that facility, she exhibited agitation behaviors, and so she was denied entry and sent back to the hospital the same day.  She remained in ER holding for 6 weeks until she was transferred upstairs in Nov 2024 and has been on [facility] since.  [name] nonverbal and does not follow commands. [name] currently in enclosed bed for fall precautions. [name] comfort care only awaiting safe discharge plan.  Subjective: Seen and examined.  Sleeping in the bed No acute events Remains in bed enclosure. Overall clinical picture unchanged  Assessment and plan: Early onset dementia with behavioral disturbance Presentation thought related to progression of underlying neurodegenerative process that manifested itself as early onset dementia/psychiatric disorder..  Supportive care.  Palliative care on board.  Bipolar disorder Severe protein caloric malnutrition Chronic kidney disease stage III Hypernatremia Comfort care only: Continue Ativan  Ensure assistance with meals.     Nutrition Status: Body mass index is 13.32 kg/m.  Nutrition Problem: Severe Malnutrition Etiology: chronic illness Signs/Symptoms: severe fat depletion, severe muscle  depletion, energy intake < or equal to 50% for > or equal to 5 days Interventions: Magic cup   DVT prophylaxis: None Disposition: No family at bedside. Status is: Inpatient Remains inpatient appropriate because: Long length of stay.  Pending safe disposition   Code Status:     Code Status: Limited: Do not attempt resuscitation (DNR) -DNR-LIMITED -Do Not Intubate/DNI    Family Communication: None at bedside   Consultants: Neurology Psychiatry Palliative care   Procedures: None   Physical Exam: Remains in enclosure bed.   Nonverbal not following commands [name] able to feed herself some time mostly she is being fed Data Reviewed:   No new labs to review   Vitals:   11/02/23 1100 11/02/23 2034 11/03/23 2135 11/04/23 2118  BP: 106/78 108/76 99/70 105/67  Pulse: 74 74 70 65  Resp: 18 18 16 17   Temp: 98 F (36.7 C)  98.9 F (37.2 C) 97.7 F (36.5 C)  TempSrc: Oral  Oral Axillary  SpO2:  100% 98% 100%  Weight:      Height:       Author: Mennie LAMY, MD 11/05/2023 11:27 AM  For on call review [email or web address].

## 2023-11-06 NOTE — Plan of Care (Signed)

## 2023-11-06 NOTE — Progress Notes (Signed)
  Progress Note   Patient: Natalie Brown FMW:[id number] DOB: Sep 23, 1965 DOA: 01/22/2023     238 DOS: the patient was seen and examined on 11/06/2023  Hospital course: 58 year old with past medical history significant for bipolar disorder, early onset dementia who was admitted for failure to thrive , increasingly erratic behavior and cognitive impairments in 2023.  She was evaluated by neurology and psychiatry, reversible or treatable cause for her condition could not be found.  It appears that she has a early onset dementia with comorbid bipolar disorder, leading to weight loss and malnutrition and failure to thrive .  For some reason, during that admission, there was a prolonged delay for over a year in placement in memory care until October 2024, and on arriving at that facility, she exhibited agitation behaviors, and so she was denied entry and sent back to the hospital the same day.  She remained in ER holding for 6 weeks until she was transferred upstairs in Nov 2024 and has been on [facility] since.  [name] nonverbal and does not follow commands. [name] currently in enclosed bed for fall precautions. [name] comfort care only awaiting safe discharge plan.  Subjective: Patient seen and examined No acute events Remains in bed enclosure. Overall clinical picture unchanged  Assessment and plan: Early onset dementia with behavioral disturbance Presentation thought related to progression of underlying neurodegenerative process that manifested itself as early onset dementia/psychiatric disorder..  Supportive care.  Palliative care on board.  Bipolar disorder Severe protein caloric malnutrition Chronic kidney disease stage III Hypernatremia Comfort care only: Continue Ativan  Ensure assistance with meals.     Nutrition Status: Body mass index is 13.32 kg/m.  Nutrition Problem: Severe Malnutrition Etiology: chronic illness Signs/Symptoms: severe fat depletion, severe muscle depletion, energy  intake < or equal to 50% for > or equal to 5 days Interventions: Magic cup   DVT prophylaxis: None Disposition: No family at bedside. Status is: Inpatient Remains inpatient appropriate because: Long length of stay.  Pending safe disposition   Code Status:     Code Status: Limited: Do not attempt resuscitation (DNR) -DNR-LIMITED -Do Not Intubate/DNI    Family Communication: None at bedside   Consultants: Neurology Psychiatry Palliative care   Procedures: None   Physical Exam: Remains in enclosure bed.   Non verbal does not follow commands but abel to eat when food is given Full exam deferred for comfort  Data Reviewed:   No new labs to review   Vitals:   11/02/23 2034 11/03/23 2135 11/04/23 2118 11/06/23 0031  BP: 108/76 99/70 105/67 128/74  Pulse: 74 70 65 65  Resp: 18 16 17 20   Temp:   97.7 F (36.5 C) 97.6 F (36.4 C)  TempSrc:  Oral Axillary   SpO2: 100% 98% 100% 100%  Weight:      Height:       Author: Mennie LAMY, MD 11/06/2023 11:24 AM  For on call review [email or web address].

## 2023-11-07 NOTE — Progress Notes (Signed)
  Progress Note   Patient: SAMHITA KRETSCH FMW:[id number] DOB: [birth year] DOA: 01/22/2023     239 DOS: the patient was seen and examined on 11/07/2023  Hospital course: Patient is a 58 year old female past medical history significant for bipolar disorder and early onset dementia.  Patient was admitted for failure to thrive .  Patient has had a long and complicated hospitalization spanning 2 years.  Patient was admitted in 2023 with increasingly erratic behavior and cognitive impairment.  She was evaluated by Neurology and Psychiatry, but no treatable or reversible cause of her impairment could be found.  Clinical condition was complicated by weight loss, malnutrition and failure to thrive .  Patient was briefly placed in a memory care unit in October 2024, and on arriving at that facility, she exhibited agitation behaviors, and so she was denied entry and sent back to the hospital the same day.  Patient has remained in the hospital since then.  Patient is nonverbal and does not follow commands. She can ambulate with assistance. [name] comfort care.   Assessment and Plan:   Early onset Dementia with behavioral disturbance: Bipolar disorder: Severe protein calorie malnutrition: Chronic kidney disease stage IIIa: Hypernatremia:   Comfort care.   - On Ativan  0.5 mg TID, and ensure supplementations.  Continue assistance with meals.  Restraint order renewed.  Working closely with TOC on disposition planning. -Ensure adequate hydration.   DVT prophylaxis: None Disposition: No family at bedside. Status is: Inpatient Remains inpatient appropriate because: Long length of stay.  Pending safe disposition   Code Status:     Code Status: Limited: Do not attempt resuscitation (DNR) -DNR-LIMITED -Do Not Intubate/DNI    Family Communication: None at bedside   Consultants: Neurology Psychiatry Palliative care   Procedures: None   Physical Exam: Remains in enclosure bed.   Non verbal does not  follow commands but abel to eat when food is given Full exam deferred for comfort  Data Reviewed:   No new labs to review   Vitals:   11/02/23 2034 11/03/23 2135 11/04/23 2118 11/06/23 0031  BP: 108/76 99/70 105/67 128/74  Pulse: 74 70 65 65  Resp: 18 16 17 20   Temp:   97.7 F (36.5 C) 97.6 F (36.4 C)  TempSrc:  Oral Axillary   SpO2: 100% 98% 100% 100%  Weight:      Height:       Author: Leatrice LILLETTE Chapel, MD 11/07/2023 4:30 PM  For on call review [email or web address].

## 2023-11-07 NOTE — Plan of Care (Signed)

## 2023-11-08 NOTE — Plan of Care (Signed)

## 2023-11-08 NOTE — Progress Notes (Signed)
  Progress Note   Patient: Natalie Brown FMW:[id number] DOB: 11-17-1965 DOA: 01/22/2023     240 DOS: the patient was seen and examined on 11/08/2023  Hospital course: Patient is a 58 year old female past medical history significant for bipolar disorder and early onset dementia.  Patient was admitted for failure to thrive .  Patient has had a long and complicated hospitalization spanning 2 years.  Patient was admitted in 2023 with increasingly erratic behavior and cognitive impairment.  She was evaluated by Neurology and Psychiatry, but no treatable or reversible cause of her impairment could be found.  Clinical condition was complicated by weight loss, malnutrition and failure to thrive .  Patient was briefly placed in a memory care unit in October 2024, and on arriving at that facility, she exhibited agitation behaviors, and so she was denied entry and sent back to the hospital the same day.  Patient has remained in the hospital since then.  Patient is nonverbal and does not follow commands. She can ambulate with assistance. [name] comfort care.  11/08/2023: Patient seen.  No new changes.   Assessment and Plan:   Early onset Dementia with behavioral disturbance: Bipolar disorder: Severe protein calorie malnutrition: Chronic kidney disease stage IIIa: Hypernatremia:   Comfort care.   - On Ativan  0.5 mg TID, and ensure supplementations.  Continue assistance with meals.  Restraint order renewed.  Working closely with TOC on disposition planning. -Ensure adequate hydration.   DVT prophylaxis: None Disposition: No family at bedside. Status is: Inpatient Remains inpatient appropriate because: Long length of stay.  Pending safe disposition   Code Status:     Code Status: Limited: Do not attempt resuscitation (DNR) -DNR-LIMITED -Do Not Intubate/DNI    Family Communication: None at bedside   Consultants: Neurology Psychiatry Palliative care   Procedures: None   Physical Exam: Remains  in enclosure bed.   Non verbal does not follow commands but abel to eat when food is given Full exam deferred for comfort  Data Reviewed:   No new labs to review   Vitals:   11/02/23 2034 11/03/23 2135 11/04/23 2118 11/06/23 0031  BP: 108/76 99/70 105/67 128/74  Pulse: 74 70 65 65  Resp: 18 16 17 20   Temp:   97.7 F (36.5 C) 97.6 F (36.4 C)  TempSrc:  Oral Axillary   SpO2: 100% 98% 100% 100%  Weight:      Height:       Author: Leatrice LILLETTE Chapel, MD 11/08/2023 8:44 AM  For on call review [email or web address].

## 2023-11-08 NOTE — Plan of Care (Signed)

## 2023-11-09 NOTE — Plan of Care (Signed)

## 2023-11-09 NOTE — Progress Notes (Signed)
  Progress Note   Patient: Natalie Brown FMW:[id number] DOB: Dec 29, 1965 DOA: 01/22/2023     241 DOS: the patient was seen and examined on 11/09/2023  Hospital course: Patient is a 58 year old female past medical history significant for bipolar disorder and early onset dementia.  Patient was admitted for failure to thrive .  Patient has had a long and complicated hospitalization spanning 2 years.  Patient was admitted in 2023 with increasingly erratic behavior and cognitive impairment.  She was evaluated by Neurology and Psychiatry, but no treatable or reversible cause of her impairment could be found.  Clinical condition was complicated by weight loss, malnutrition and failure to thrive .  Patient was briefly placed in a memory care unit in October 2024, and on arriving at that facility, she exhibited agitation behaviors, and so she was denied entry and sent back to the hospital the same day.  Patient has remained in the hospital since then.  Patient is nonverbal and does not follow commands. She can ambulate with assistance. [name] comfort care.  11/09/2023: Patient seen.  No new changes.   Assessment and Plan:   Early onset Dementia with behavioral disturbance: Bipolar disorder: Severe protein calorie malnutrition: Chronic kidney disease stage IIIa: Hypernatremia:   Comfort care.   - On Ativan  0.5 mg TID, and ensure supplementations.  Continue assistance with meals.  Restraint order renewed.  Working closely with TOC on disposition planning. -Ensure adequate hydration.   DVT prophylaxis: None Disposition: No family at bedside. Status is: Inpatient Remains inpatient appropriate because: Long length of stay.  Pending safe disposition   Code Status:     Code Status: Limited: Do not attempt resuscitation (DNR) -DNR-LIMITED -Do Not Intubate/DNI    Family Communication: None at bedside   Consultants: Neurology Psychiatry Palliative care   Procedures: None   Physical Exam: Remains  in enclosure bed.   Non verbal does not follow commands but abel to eat when food is given Full exam deferred for comfort  Data Reviewed:   No new labs to review   Vitals:   11/02/23 2034 11/03/23 2135 11/04/23 2118 11/06/23 0031  BP: 108/76 99/70 105/67 128/74  Pulse: 74 70 65 65  Resp: 18 16 17 20   Temp:   97.7 F (36.5 C) 97.6 F (36.4 C)  TempSrc:  Oral Axillary   SpO2: 100% 98% 100% 100%  Weight:      Height:        Author: Leatrice LILLETTE Chapel, MD 11/09/2023 11:35 AM  For on call review [email or web address].

## 2023-11-10 NOTE — Progress Notes (Signed)
  Progress Note   Patient: Natalie Brown FMW:[id number] DOB: 1965/11/28 DOA: 01/22/2023     242 DOS: the patient was seen and examined on 11/10/2023  Hospital course: Patient is a 58 year old female past medical history significant for bipolar disorder and early onset dementia.  Patient was admitted for failure to thrive .  Patient has had a long and complicated hospitalization spanning 2 years.  Patient was admitted in 2023 with increasingly erratic behavior and cognitive impairment.  She was evaluated by Neurology and Psychiatry, but no treatable or reversible cause of her impairment could be found.  Clinical condition was complicated by weight loss, malnutrition and failure to thrive .  Patient was briefly placed in a memory care unit in October 2024, and on arriving at that facility, she exhibited agitation behaviors, and so she was denied entry and sent back to the hospital the same day.  Patient has remained in the hospital since then.  Patient is nonverbal and does not follow commands. She can ambulate with assistance. [name] comfort care.  11/10/2023: Patient seen.  No new changes.  Restraints have been discontinued.  Patient currently has a Comptroller.  Enclosed bed has been discontinued.  Apparently, a SNF facility told them though may be coming to assess the patient.    Assessment and Plan:   Early onset Dementia with behavioral disturbance: Bipolar disorder: Severe protein calorie malnutrition: Chronic kidney disease stage IIIa: Hypernatremia:   Comfort care.   - On Ativan  0.5 mg TID, and ensure supplementations.  Continue assistance with meals.  Restraint order renewed.  Working closely with TOC on disposition planning. -Ensure adequate hydration. -Pursue disposition (see above documentation).   DVT prophylaxis: None Disposition: No family at bedside. Status is: Inpatient Remains inpatient appropriate because: Long length of stay.  Pending safe disposition   Code Status:     Code  Status: Limited: Do not attempt resuscitation (DNR) -DNR-LIMITED -Do Not Intubate/DNI    Family Communication: None at bedside   Consultants: Neurology Psychiatry Palliative care   Procedures: None   Physical Exam: Remains in enclosure bed.   Non verbal does not follow commands but abel to eat when food is given Full exam deferred for comfort  Data Reviewed:   No new labs to review   Vitals:   11/02/23 2034 11/03/23 2135 11/04/23 2118 11/06/23 0031  BP: 108/76 99/70 105/67 128/74  Pulse: 74 70 65 65  Resp: 18 16 17 20   Temp:   97.7 F (36.5 C) 97.6 F (36.4 C)  TempSrc:  Oral Axillary   SpO2: 100% 98% 100% 100%  Weight:      Height:        Author: Leatrice LILLETTE Chapel, MD 11/10/2023 9:46 AM  For on call review [email or web address].

## 2023-11-10 NOTE — TOC Progression Note (Signed)
 Transition of Care [facility]) - Progression Note    Patient Details  Name: Natalie Brown MRN: [id number] Date of Birth: 22-Oct-1965  [facility]) CM/SW Contact  Minas Bonser A [place], LCSW Phone Number: 11/10/2023, 2:30 PM  Clinical Narrative:     CSW was notified by Inpatient case management supervisor, that [facility] nursing, Jaleigha Deane will be reviewing pt for possible placement consideration to one of their facilities, [facility]. CSW notified pt's treatment team and Government social research officer.   CSW to continue conversation with facility post visit and proceed from there.   TOC will continue to follow.   Expected Discharge Plan: Skilled Nursing Facility Barriers to Discharge: SNF Pending bed offer, Insurance Authorization  Expected Discharge Plan and Services In-house Referral: Clinical Social Work   Post Acute Care Choice: Skilled Nursing Facility Living arrangements for the past 2 months: Assisted Living Facility                                       Social Determinants of Health (SDOH) Interventions SDOH Screenings   Food Insecurity: No Food Insecurity (05/14/2023)  Housing: Low Risk  (01/24/2023)  Transportation Needs: No Transportation Needs (05/14/2023)  Utilities: Not At Risk (05/14/2023)  Alcohol Screen: Low Risk  (09/06/2021)  Depression (PHQ2-9): Medium Risk (05/26/2020)  Tobacco Use: High Risk (04/04/2023)    Readmission Risk Interventions     No data to display

## 2023-11-10 NOTE — Plan of Care (Signed)
  Problem: Health Behavior/Discharge Planning: Goal: Ability to manage health-related needs will improve Outcome: Progressing   Problem: Clinical Measurements: Goal: Ability to maintain clinical measurements within normal limits will improve Outcome: Progressing   Problem: Pain Managment: Goal: General experience of comfort will improve and/or be controlled Outcome: Progressing

## 2023-11-11 ENCOUNTER — Other Ambulatory Visit (HOSPITAL_BASED_OUTPATIENT_CLINIC_OR_DEPARTMENT_OTHER): Payer: Self-pay

## 2023-11-11 NOTE — Plan of Care (Signed)

## 2023-11-11 NOTE — Progress Notes (Signed)
  PROGRESS NOTE  Natalie Brown  FMW:[id number] DOB: 11-05-1965 DOA: 01/22/2023 PCP: [name], NP  Consultants  Patient is a 58 year old female past medical history significant for bipolar disorder and early onset dementia.  Patient was admitted for failure to thrive .  Patient has had a long and complicated hospitalization spanning 2 years.  Patient was admitted in 2023 with increasingly erratic behavior and cognitive impairment.  She was evaluated by Neurology and Psychiatry, but no treatable or reversible cause of her impairment could be found.  Clinical condition was complicated by weight loss, malnutrition and failure to thrive .  Patient was briefly placed in a memory care unit in October 2024, and on arriving at that facility, she exhibited agitation behaviors, and so she was denied entry and sent back to the hospital the same day.  Patient has remained in the hospital since then.  Patient is nonverbal and does not follow commands. She can ambulate with assistance. [name] comfort care.   11/11/2023: Patient seen.  No new changes.  Patient currently has a Comptroller.  Enclosed bed remains discontinued as apparently, a SNF facility told them though may be coming to assess the patient.     Assessment and Plan:   Early onset Dementia with behavioral disturbance: Bipolar disorder: Severe protein calorie malnutrition: Chronic kidney disease stage IIIa: Hypernatremia:   Comfort care.   - On Ativan  0.5 mg TID, and ensure supplementations.  Continue assistance with meals.  Restraint order renewed.  Working closely with TOC on disposition planning. -Ensure adequate hydration. -Pursue disposition (see above documentation).   DVT prophylaxis: None Disposition: No family at bedside. Status is: Inpatient Remains inpatient appropriate because: Long length of stay.  Pending safe disposition   Code Status:     Code Status: Limited: Do not attempt resuscitation (DNR) -DNR-LIMITED -Do Not  Intubate/DNI    Family Communication: None at bedside   Consultants: Neurology Psychiatry Palliative care   Procedures: None   Physical Exam: Remains in enclosure bed.   Non verbal does not follow commands but abel to eat when food is given Full exam deferred for comfort   Data Reviewed:   No new labs to review     LOS: 243 days   35 minutes with more than 50% spent in reviewing records, counseling patient/family and coordinating care.  Reyes VEAR Gaw, MD [facility] [email or web address] 11/11/2023, 3:07 PM

## 2023-11-12 MED ORDER — LORAZEPAM 2 MG/ML IJ SOLN: 1.0000 mg | INTRAMUSCULAR | Status: DC

## 2023-11-12 MED ORDER — HALOPERIDOL LACTATE 5 MG/ML IJ SOLN
INTRAMUSCULAR | Status: AC
  Filled 2023-11-12: qty 1

## 2023-11-12 MED ORDER — LORAZEPAM 2 MG/ML IJ SOLN: 2.0000 mg | INTRAMUSCULAR | Status: DC

## 2023-11-12 MED ORDER — ZIPRASIDONE MESYLATE 20 MG IM SOLR: 10.0000 mg | Freq: Once | INTRAMUSCULAR | Status: DC

## 2023-11-12 MED ORDER — DIPHENHYDRAMINE HCL 50 MG/ML IJ SOLN
50.0000 mg | INTRAMUSCULAR | Status: AC
  Administered 2023-11-12: 50 mg via INTRAMUSCULAR
  Filled 2023-11-12: qty 1

## 2023-11-12 MED ORDER — ZIPRASIDONE MESYLATE 20 MG IM SOLR: 20.0000 mg | INTRAMUSCULAR | Status: DC

## 2023-11-12 MED ORDER — LORAZEPAM 2 MG/ML IJ SOLN
1.0000 mg | INTRAMUSCULAR | Status: AC
  Administered 2023-11-12: 1 mg via INTRAMUSCULAR
  Filled 2023-11-12: qty 1

## 2023-11-12 NOTE — Plan of Care (Signed)

## 2023-11-12 NOTE — Progress Notes (Signed)
  PROGRESS NOTE  Natalie Brown  FMW:[id number] DOB: June 16, 1965 DOA: 01/22/2023 PCP: [name], NP  Consultants  Patient is a 58 year old female past medical history significant for bipolar disorder and early onset dementia.  Patient was admitted for failure to thrive .  Patient has had a long and complicated hospitalization spanning 2 years.  Patient was admitted in 2023 with increasingly erratic behavior and cognitive impairment.  She was evaluated by Neurology and Psychiatry, but no treatable or reversible cause of her impairment could be found.  Clinical condition was complicated by weight loss, malnutrition and failure to thrive .  Patient was briefly placed in a memory care unit in October 2024, and on arriving at that facility, she exhibited agitation behaviors, and so she was denied entry and sent back to the hospital the same day.  Patient has remained in the hospital since then.  Patient is nonverbal and does not follow commands. She can ambulate with assistance. [name] comfort care.   11/12/2023: Patient seen.  No new changes.  SNF then evaluated her yesterday 7/22 unable to accept.  I have written her to be enclosed in bed again as this seems safest.  No other changes.     Assessment and Plan:   Early onset Dementia with behavioral disturbance: Bipolar disorder: Severe protein calorie malnutrition: Chronic kidney disease stage IIIa: Hypernatremia:   Comfort care.   - On Ativan  0.5 mg TID, and ensure supplementations.  Continue assistance with meals.  Restraint order renewed.  Working closely with TOC on disposition planning. -Ensure adequate hydration. -Pursue disposition (see above documentation).   DVT prophylaxis: None Disposition: No family at bedside. Status is: Inpatient Remains inpatient appropriate because: Long length of stay.  Pending safe disposition   Code Status:     Code Status: Limited: Do not attempt resuscitation (DNR) -DNR-LIMITED -Do Not Intubate/DNI     Family Communication: None at bedside   Consultants: Neurology Psychiatry Palliative care   Procedures: None   Physical Exam: Remains in enclosure bed.   Non verbal does not follow commands but abel to eat when food is given Full exam deferred for comfort   Data Reviewed:   No new labs to review     LOS: 244 days   35 minutes with more than 50% spent in reviewing records, counseling patient/family and coordinating care.  Reyes VEAR Gaw, MD [email or web address] 11/12/2023, 2:16 PM

## 2023-11-12 NOTE — TOC Progression Note (Signed)
 Transition of Care [facility]) - Progression Note    Patient Details  Name: Natalie Brown MRN: [id number] Date of Birth: 01-Sep-1965  Transition of Care [facility]) CM/SW Contact  Roper Tolson A [place], LCSW Phone Number: 11/12/2023, 11:07 AM  Clinical Narrative:     CSW was notified by Jon at [facility] facilities that pt is not able to be accommodated at [facility]. She said that pt needs a more secure unit and they are unable to accommodate that in their facility. MD and Nursing Manager notified along with [facility] leadership.    TOC will continue to follow.   Expected Discharge Plan: Skilled Nursing Facility Barriers to Discharge: SNF Pending bed offer, Insurance Authorization               Expected Discharge Plan and Services In-house Referral: Clinical Social Work   Post Acute Care Choice: Skilled Nursing Facility Living arrangements for the past 2 months: Assisted Living Facility                                       Social Drivers of Health (SDOH) Interventions SDOH Screenings   Food Insecurity: No Food Insecurity (05/14/2023)  Housing: Low Risk  (01/24/2023)  Transportation Needs: No Transportation Needs (05/14/2023)  Utilities: Not At Risk (05/14/2023)  Alcohol Screen: Low Risk  (09/06/2021)  Depression (PHQ2-9): Medium Risk (05/26/2020)  Tobacco Use: High Risk (04/04/2023)    Readmission Risk Interventions     No data to display

## 2023-11-13 NOTE — Progress Notes (Signed)
 PROGRESS NOTE  Natalie Brown  FMW:[id number] DOB: 01-29-1966 DOA: 01/22/2023 PCP: [name], NP   Brief Narrative: Patient is a 58 year old female with past medical history of bipolar disorder, early onset dementia who was initially admitted for failure to thrive .  Patient had a long and complicated hospitalization spanning 2 years.  She was admitted on 2003 with increasingly erratic behavior and cognitive impairment.  She was evaluated by psychiatry and neurology but no treatable or reversible cause of her impairment was found.  Her hospital course was complicated by weight loss, malnutrition, failure to thrive .  She was briefly placed in a memory care unit in October 2020 for an arriving at the facility, she exhibited agitation so she was denied entry and sent back to the hospital the same day.  Patient has remained in the hospital since then.  [name] nonverbal and does not follow any commands.  She can ambulate with assistance.  Currently she is on comfort care.  TOC following for disposition.  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  Currently on full comfort care.  Patient is mostly nonverbal and does not follow any commands.  She ambulates with assistance. Currently on Ativan  0.5 mg 3 times daily.  Continue assisting with meals.  Restraints ordered if agitated.  TOC following for disposition.  Severe protein calorie malnutrition: Dietitian was following.  Needs assistance with meals.  CKD stage III: Currently  kidney function at baseline.   Nutrition Problem: Severe Malnutrition Etiology: chronic illness    DVT prophylaxis:     Code Status: Limited: Do not attempt  resuscitation (DNR) -DNR-LIMITED -Do Not Intubate/DNI   Family Communication: Non at bedside  Patient status:Inpatient  Patient is from :Home  Anticipated discharge to:not sure  Estimated DC date:not sure   Consultants: Psychiatry, neurology  Procedures: None yet  Antimicrobials:  Anti-infectives (From admission, onward)    None       Subjective: Patient seen at bedside.  She was being cleaned.  She was visibly agitated.  Not in distress.  Physical examination not done.  Objective: Vitals:   11/11/23 0928 11/12/23 0103 11/12/23 0455 11/13/23 0500  BP: (!) 107/94 93/74    Pulse: 95 70    Resp: 18     Temp: 98.9 F (37.2 C)     TempSrc:      SpO2: 100%     Weight:   40 kg 40 kg  Height:        Intake/Output Summary (Last 24 hours) at 11/13/2023 0746 Last data filed at 11/12/2023 2300 Gross per 24 hour  Intake 178 ml  Output --  Net 178 ml   Filed Weights   11/11/23 0500 11/12/23 0455 11/13/23 0500  Weight: 41.1 kg 40 kg 40 kg    Examination:  General exam: Overall comfortable, not in distress, naked, being cleaned, agitated   Data Reviewed: I have personally reviewed following labs and imaging studies  CBC: No results for input(s): WBC, NEUTROABS, HGB, HCT, MCV, PLT in the last 168 hours. Basic Metabolic Panel: No results for input(s): NA, K, CL, CO2, GLUCOSE, BUN, CREATININE, CALCIUM , MG, PHOS in the last 168 hours.   No results found for this or any previous visit (from  the past 240 hours).   Radiology Studies: No results found.  Scheduled Meds:  feeding supplement  237 mL Oral BID BM   LORazepam   1 mg Oral TID   Continuous Infusions:   LOS: 245 days   Ivonne Mustache, MD [facility] P7/24/2025, 7:46 AM

## 2023-11-13 NOTE — Plan of Care (Signed)

## 2023-11-13 NOTE — Plan of Care (Signed)
  Problem: Clinical Measurements: Goal: Will remain free from infection Outcome: Progressing Goal: Diagnostic test results will improve Outcome: Progressing Goal: Respiratory complications will improve Outcome: Progressing Goal: Cardiovascular complication will be avoided Outcome: Progressing   Problem: Activity: Goal: Risk for activity intolerance will decrease Outcome: Progressing   Problem: Nutrition: Goal: Adequate nutrition will be maintained Outcome: Progressing   Problem: Elimination: Goal: Will not experience complications related to bowel motility Outcome: Progressing Goal: Will not experience complications related to urinary retention Outcome: Progressing   Problem: Pain Managment: Goal: General experience of comfort will improve and/or be controlled Outcome: Progressing   Problem: Skin Integrity: Goal: Risk for impaired skin integrity will decrease Outcome: Progressing

## 2023-11-14 NOTE — Plan of Care (Signed)

## 2023-11-14 NOTE — Progress Notes (Signed)
 PROGRESS NOTE  Natalie Brown  FMW:[id number] DOB: November 14, 1965 DOA: 01/22/2023 PCP: [name], NP   Brief Narrative: Patient is a 58 year old female with past medical history of bipolar disorder, early onset dementia who was initially admitted for failure to thrive .  Patient had a long and complicated hospitalization spanning 2 years.  She was admitted on 2003 with increasingly erratic behavior and cognitive impairment.  She was evaluated by psychiatry and neurology but no treatable or reversible cause of her impairment was found.  Her hospital course was complicated by weight loss, malnutrition, failure to thrive .  She was briefly placed in a memory care unit in October 2020 for an arriving at the facility, she exhibited agitation so she was denied entry and sent back to the hospital the same day.  Patient has remained in the hospital since then.  [name] nonverbal and does not follow any commands.  She can ambulate with assistance.  Currently she is on comfort care.  TOC following for disposition.  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  Currently on full comfort care.  Patient is mostly nonverbal and does not follow any commands.  She ambulates with assistance. Currently on Ativan  0.5 mg 3 times daily.  Continue assisting with meals.  Restraints order are there  if agitated.  TOC following for disposition.  Severe protein calorie malnutrition: Dietitian was following.  Needs assistance with meals.  CKD stage III: Currently  kidney function at baseline.   Nutrition Problem: Severe Malnutrition Etiology: chronic illness    DVT prophylaxis:     Code Status: Limited: Do not attempt  resuscitation (DNR) -DNR-LIMITED -Do Not Intubate/DNI   Family Communication: Non at bedside  Patient status:Inpatient  Patient is from :Home  Anticipated discharge to:not sure  Estimated DC date:not sure   Consultants: Psychiatry, neurology  Procedures: None yet  Antimicrobials:  Anti-infectives (From admission, onward)    None       Subjective: Patient seen at bedside.  She was under the cage.  Appears confused.  Nonverbal.  Mildly agitated.  Objective: Vitals:   11/12/23 0103 11/12/23 0455 11/13/23 0500 11/14/23 0719  BP: 93/74     Pulse: 70     Resp:      Temp:      TempSrc:      SpO2:      Weight:  40 kg 40 kg 40 kg  Height:        Intake/Output Summary (Last 24 hours) at 11/14/2023 1044 Last data filed at 11/14/2023 0950 Gross per 24 hour  Intake 474 ml  Output --  Net 474 ml   Filed Weights   11/12/23 0455 11/13/23 0500 11/14/23 0719  Weight: 40 kg 40 kg 40 kg    Examination:  General exam: Inside the cage, confused, nonverbal, not in apparent distress  Data Reviewed: I have personally reviewed following labs and imaging studies  CBC: No results for input(s): WBC, NEUTROABS, HGB, HCT, MCV, PLT in the last 168 hours. Basic Metabolic Panel: No results for input(s): NA, K, CL, CO2, GLUCOSE, BUN, CREATININE, CALCIUM , MG, PHOS in the last 168 hours.   No results found for this or any previous visit (from the past 240 hours).  Radiology Studies: No results found.  Scheduled Meds:  feeding supplement  237 mL Oral BID BM   LORazepam   1 mg Oral TID   Continuous Infusions:   LOS: 246 days   Natalie Mustache, MD [facility] P7/25/2025, 10:44 AM

## 2023-11-15 MED ORDER — HALOPERIDOL LACTATE 5 MG/ML IJ SOLN
2.0000 mg | Freq: Once | INTRAMUSCULAR | Status: AC
  Administered 2023-11-15: 2 mg via INTRAMUSCULAR
  Filled 2023-11-15: qty 1

## 2023-11-15 MED ORDER — HALOPERIDOL LACTATE 5 MG/ML IJ SOLN: 2.0000 mg | Freq: Once | INTRAMUSCULAR | Status: DC

## 2023-11-15 NOTE — Progress Notes (Signed)
 PROGRESS NOTE  Natalie Brown  FMW:[id number] DOB: 07-Mar-1966 DOA: 01/22/2023 PCP: [name], NP   Brief Narrative: Patient is a 58 year old female with past medical history of bipolar disorder, early onset dementia who was initially admitted for failure to thrive .  Patient had a long and complicated hospitalization spanning 2 years.  She was admitted on 2003 with increasingly erratic behavior and cognitive impairment.  She was evaluated by psychiatry and neurology but no treatable or reversible cause of her impairment was found.  Her hospital course was complicated by weight loss, malnutrition, failure to thrive .  She was briefly placed in a memory care unit in October 2020 for an arriving at the facility, she exhibited agitation so she was denied entry and sent back to the hospital the same day.  Patient has remained in the hospital since then.  [name] nonverbal and does not follow any commands.  She can ambulate with assistance.  Currently she is on comfort care.  TOC following for disposition.  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  Currently on full comfort care.  Patient is mostly nonverbal and does not follow any commands.  She ambulates with assistance. Currently on Ativan  0.5 mg 3 times daily.  Continue assisting with meals.  Restraints order are there  if agitated.  TOC following for disposition.  Severe protein calorie malnutrition: Dietitian was following.  Needs assistance with meals.  CKD stage III: Currently  kidney function at baseline.   Nutrition Problem: Severe Malnutrition Etiology: chronic illness    DVT prophylaxis:     Code Status: Limited: Do not attempt  resuscitation (DNR) -DNR-LIMITED -Do Not Intubate/DNI   Family Communication: Non at bedside  Patient status:Inpatient  Patient is from :Home  Anticipated discharge to:not sure  Estimated DC date:not sure   Consultants: Psychiatry, neurology  Procedures: None yet  Antimicrobials:  Anti-infectives (From admission, onward)    None       Subjective: Patient seen at bedside.  She was cuddled inside the cage.  Confused, nonverbal  Objective: Vitals:   11/12/23 0103 11/12/23 0455 11/13/23 0500 11/14/23 0719  BP: 93/74     Pulse: 70     Resp:      Temp:      TempSrc:      SpO2:      Weight:  40 kg 40 kg 40 kg  Height:       No intake or output data in the 24 hours ending 11/15/23 1111  Filed Weights   11/12/23 0455 11/13/23 0500 11/14/23 0719  Weight: 40 kg 40 kg 40 kg    Examination:  General exam: Inside the case, confused, nonverbal, not in apparent distress  Data Reviewed: I have personally reviewed following labs and imaging studies  CBC: No results for input(s): WBC, NEUTROABS, HGB, HCT, MCV, PLT in the last 168 hours. Basic Metabolic Panel: No results for input(s): NA, K, CL, CO2, GLUCOSE, BUN, CREATININE, CALCIUM , MG, PHOS in the last 168 hours.   No results found for this or any previous visit (from the past 240 hours).   Radiology Studies: No results found.  Scheduled Meds:  feeding supplement  237 mL Oral BID BM   LORazepam   1 mg  Oral TID   Continuous Infusions:   LOS: 247 days   Ivonne Mustache, MD [facility] P7/26/2025, 11:11 AM

## 2023-11-15 NOTE — Plan of Care (Signed)

## 2023-11-16 NOTE — Progress Notes (Signed)
 PROGRESS NOTE  Natalie Brown  FMW:[id number] DOB: 05-Jun-1965 DOA: 01/22/2023 PCP: [name], NP   Brief Narrative: Patient is a 58 year old female with past medical history of bipolar disorder, early onset dementia who was initially admitted for failure to thrive .  Patient had a long and complicated hospitalization spanning 2 years.  She was admitted on 2003 with increasingly erratic behavior and cognitive impairment.  She was evaluated by psychiatry and neurology but no treatable or reversible cause of her impairment was found.  Her hospital course was complicated by weight loss, malnutrition, failure to thrive .  She was briefly placed in a memory care unit in October 2020 for an arriving at the facility, she exhibited agitation so she was denied entry and sent back to the hospital the same day.  Patient has remained in the hospital since then.  [name] nonverbal and does not follow any commands.  She can ambulate with assistance.  Currently she is on comfort care.  TOC following for disposition.  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  Currently on full comfort care.  Patient is mostly nonverbal and does not follow any commands.  She ambulates with assistance. Currently on Ativan  0.5 mg 3 times daily.  Continue assisting with meals.  Restraints order are there  if agitated.  TOC following for disposition.  Severe protein calorie malnutrition: Dietitian was following.  Needs assistance with meals.  CKD stage III: Currently  kidney function at baseline.   Nutrition Problem: Severe Malnutrition Etiology: chronic illness    DVT prophylaxis:     Code Status: Limited: Do not attempt  resuscitation (DNR) -DNR-LIMITED -Do Not Intubate/DNI   Family Communication: Non at bedside  Patient status:Inpatient  Patient is from :Home  Anticipated discharge to:not sure  Estimated DC date:not sure   Consultants: Psychiatry, neurology  Procedures: None yet  Antimicrobials:  Anti-infectives (From admission, onward)    None       Subjective: Patient seen at bedside.  She was inside the case.  When I arrived, she was trying to get out from the cage. Currently renewing restrains every day.  Objective: Vitals:   11/12/23 0103 11/12/23 0455 11/13/23 0500 11/14/23 0719  BP: 93/74     Pulse: 70     Resp:      Temp:      TempSrc:      SpO2:      Weight:  40 kg 40 kg 40 kg  Height:        Intake/Output Summary (Last 24 hours) at 11/16/2023 1144 Last data filed at 11/16/2023 0900 Gross per 24 hour  Intake 595 ml  Output --  Net 595 ml    Filed Weights   11/12/23 0455 11/13/23 0500 11/14/23 0719  Weight: 40 kg 40 kg 40 kg    Examination:  General exam: Inside the cage, confused, nonverbal  Data Reviewed: I have personally reviewed following labs and imaging studies  CBC: No results for input(s): WBC, NEUTROABS, HGB, HCT, MCV, PLT in the last 168 hours. Basic Metabolic Panel: No results for input(s): NA, K, CL, CO2, GLUCOSE, BUN, CREATININE, CALCIUM , MG, PHOS in the last 168 hours.   No results found for this or any previous visit (  from the past 240 hours).   Radiology Studies: No results found.  Scheduled Meds:  feeding supplement  237 mL Oral BID BM   LORazepam   1 mg Oral TID   Continuous Infusions:   LOS: 248 days   Ivonne Mustache, MD [facility] P7/27/2025, 11:44 AM

## 2023-11-16 NOTE — Plan of Care (Signed)

## 2023-11-16 NOTE — Plan of Care (Signed)

## 2023-11-17 NOTE — Progress Notes (Signed)
 PROGRESS NOTE  Natalie Brown  FMW:[id number] DOB: 09-May-1965 DOA: 01/22/2023 PCP: [name], NP   Brief Narrative: Patient is a 58 year old female with past medical history of bipolar disorder, early onset dementia who was initially admitted for failure to thrive .  Patient had a long and complicated hospitalization spanning 2 years.  She was admitted on 2003 with increasingly erratic behavior and cognitive impairment.  She was evaluated by psychiatry and neurology but no treatable or reversible cause of her impairment was found.  Her hospital course was complicated by weight loss, malnutrition, failure to thrive .  She was briefly placed in a memory care unit in October 2020 for an arriving at the facility, she exhibited agitation so she was denied entry and sent back to the hospital the same day.  Patient has remained in the hospital since then.  [name] nonverbal and does not follow any commands.  She can ambulate with assistance.  Currently she is on comfort care.  TOC following for disposition.  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  Currently on full comfort care.  Patient is mostly nonverbal and does not follow any commands.  She ambulates with assistance.Mostly kept on cage through out the day. Currently on Ativan  1 mg ,3 times daily.  Continue assisting with meals.  Restraints order are there  if agitated.  TOC following for disposition.  Severe protein calorie malnutrition: Dietitian was following.  Needs assistance with meals.  CKD stage III: Currently  kidney function at baseline.   Nutrition Problem: Severe Malnutrition Etiology: chronic illness    DVT prophylaxis:      Code Status: Limited: Do not attempt resuscitation (DNR) -DNR-LIMITED -Do Not Intubate/DNI   Family Communication: None at bedside  Patient status:Inpatient  Patient is from :Home  Anticipated discharge to:not sure  Estimated DC date:not sure   Consultants: Psychiatry, neurology  Procedures: None yet  Antimicrobials:  Anti-infectives (From admission, onward)    None       Subjective: Patient seen at bedside this morning.  She was inside the cage.  She was being fed.  Does not appear to be agitated this morning  Objective: Vitals:   11/12/23 0455 11/13/23 0500 11/14/23 0719 11/16/23 1900  BP:    122/76  Pulse:    85  Resp:    15  Temp:    98.2 F (36.8 C)  TempSrc:    Tympanic  SpO2:      Weight: 40 kg 40 kg 40 kg   Height:        Intake/Output Summary (Last 24 hours) at 11/17/2023 1039 Last data filed at 11/17/2023 0900 Gross per 24 hour  Intake 1657 ml  Output --  Net 1657 ml    Filed Weights   11/12/23 0455 11/13/23 0500 11/14/23 0719  Weight: 40 kg 40 kg 40 kg    Examination:  General exam: Inside the cage, confused, nonverbal, attempting to have breakfast  Data Reviewed: I have personally reviewed following labs and imaging studies  CBC: No results for input(s): WBC, NEUTROABS, HGB, HCT, MCV, PLT in the last 168 hours. Basic Metabolic Panel: No results for input(s): NA, K, CL, CO2, GLUCOSE, BUN, CREATININE, CALCIUM , MG, PHOS in the last 168  hours.   No results found for this or any previous visit (from the past 240 hours).   Radiology Studies: No results found.  Scheduled Meds:  feeding supplement  237 mL Oral BID BM   LORazepam   1 mg Oral TID   Continuous Infusions:   LOS: 249 days   Ivonne Mustache, MD [facility] P7/28/2025, 10:39 AM

## 2023-11-18 NOTE — Plan of Care (Signed)
  Problem: Education: Goal: Knowledge of General Education information will improve Description: Including pain rating scale, medication(s)/side effects and non-pharmacologic comfort measures Outcome: Not Progressing   Problem: Health Behavior/Discharge Planning: Goal: Ability to manage health-related needs will improve Outcome: Not Progressing   Problem: Clinical Measurements: Goal: Ability to maintain clinical measurements within normal limits will improve Outcome: Not Progressing Goal: Will remain free from infection Outcome: Not Progressing Goal: Diagnostic test results will improve Outcome: Not Progressing Goal: Respiratory complications will improve Outcome: Not Progressing Goal: Cardiovascular complication will be avoided Outcome: Not Progressing   Problem: Activity: Goal: Risk for activity intolerance will decrease Outcome: Not Progressing   Problem: Nutrition: Goal: Adequate nutrition will be maintained Outcome: Not Progressing   Problem: Coping: Goal: Level of anxiety will decrease Outcome: Not Progressing   Problem: Elimination: Goal: Will not experience complications related to bowel motility Outcome: Not Progressing Goal: Will not experience complications related to urinary retention Outcome: Not Progressing   Problem: Pain Managment: Goal: General experience of comfort will improve and/or be controlled Outcome: Not Progressing   Problem: Safety: Goal: Ability to remain free from injury will improve Outcome: Not Progressing   Problem: Skin Integrity: Goal: Risk for impaired skin integrity will decrease Outcome: Not Progressing   Problem: Safety: Goal: Violent Restraint(s) Outcome: Not Progressing

## 2023-11-18 NOTE — Plan of Care (Signed)

## 2023-11-18 NOTE — Progress Notes (Signed)
 PROGRESS NOTE  Natalie Brown  FMW:[id number] DOB: 05-05-1965 DOA: 01/22/2023 PCP: [name], NP   Brief Narrative: Patient is a 58 year old female with past medical history of bipolar disorder, early onset dementia who was initially admitted for failure to thrive .  Patient had a long and complicated hospitalization spanning 2 years.  She was admitted on 2003 with increasingly erratic behavior and cognitive impairment.  She was evaluated by psychiatry and neurology but no treatable or reversible cause of her impairment was found.  Her hospital course was complicated by weight loss, malnutrition, failure to thrive .  She was briefly placed in a memory care unit in October 2020 for an arriving at the facility, she exhibited agitation so she was denied entry and sent back to the hospital the same day.  Patient has remained in the hospital since then.  [name] nonverbal and does not follow any commands.  She can ambulate with assistance.  Currently she is on comfort care.  TOC following for disposition.  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  Currently on full comfort care.  Patient is mostly nonverbal and does not follow any commands.  She ambulates with assistance.Mostly kept on cage through out the day. Currently on Ativan  1 mg ,3 times daily.  Continue assisting with meals.  Restraints order are there  if agitated.  TOC following for disposition.  Severe protein calorie malnutrition: Dietitian was following.  Needs assistance with meals.  CKD stage III: Currently  kidney function at baseline.   Nutrition Problem: Severe Malnutrition Etiology: chronic illness    DVT prophylaxis:      Code Status: Limited: Do not attempt resuscitation (DNR) -DNR-LIMITED -Do Not Intubate/DNI   Family Communication: None at bedside  Patient status:Inpatient  Patient is from :Home  Anticipated discharge to:not sure  Estimated DC date:not sure   Consultants: Psychiatry, neurology  Procedures: None yet  Antimicrobials:  Anti-infectives (From admission, onward)    None       Subjective: Patient seen and examined at bedside today.  She remains same as  yesterday.  Insert the case.  Not agitated.  No new changes since yesterday  Objective: Vitals:   11/12/23 0455 11/13/23 0500 11/14/23 0719 11/16/23 1900  BP:    122/76  Pulse:    85  Resp:    15  Temp:    98.2 F (36.8 C)  TempSrc:    Tympanic  SpO2:      Weight: 40 kg 40 kg 40 kg   Height:        Intake/Output Summary (Last 24 hours) at 11/18/2023 1109 Last data filed at 11/17/2023 1800 Gross per 24 hour  Intake 532 ml  Output --  Net 532 ml    Filed Weights   11/12/23 0455 11/13/23 0500 11/14/23 0719  Weight: 40 kg 40 kg 40 kg    Examination:  General exam: Inside  the case, confused, nonverbal, not in any distress  Data Reviewed: I have personally reviewed following labs and imaging studies  CBC: No results for input(s): WBC, NEUTROABS, HGB, HCT, MCV, PLT in the last 168 hours. Basic Metabolic Panel: No results for input(s): NA, K, CL, CO2, GLUCOSE, BUN, CREATININE, CALCIUM , MG, PHOS in the  last 168 hours.   No results found for this or any previous visit (from the past 240 hours).   Radiology Studies: No results found.  Scheduled Meds:  feeding supplement  237 mL Oral BID BM   LORazepam   1 mg Oral TID   Continuous Infusions:   LOS: 250 days   Ivonne Mustache, MD [facility] P7/29/2025, 11:09 AM

## 2023-11-19 NOTE — Progress Notes (Signed)
 PROGRESS NOTE  Natalie Brown  FMW:[id number] DOB: 02-28-1966 DOA: 01/22/2023 PCP: [name], NP   Brief Narrative: Patient is a 58 year old female with past medical history of bipolar disorder, early onset dementia who was initially admitted for failure to thrive .  Patient had a long and complicated hospitalization spanning 2 years.  She was admitted on 2003 with increasingly erratic behavior and cognitive impairment.  She was evaluated by psychiatry and neurology but no treatable or reversible cause of her impairment was found.  Her hospital course was complicated by weight loss, malnutrition, failure to thrive .  She was briefly placed in a memory care unit in October 2020 for an arriving at the facility, she exhibited agitation so she was denied entry and sent back to the hospital the same day.  Patient has remained in the hospital since then.  [name] nonverbal and does not follow any commands.  She can ambulate with assistance.  Currently she is on comfort care.  TOC following for disposition.  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  Currently on full comfort care.  Patient is mostly nonverbal and does not follow any commands.  She ambulates with assistance.Mostly kept on cage through out the day. Currently on Ativan  1 mg ,3 times daily.  Continue assisting with meals.  Restraints order are there  if agitated.  TOC following for disposition.  Severe protein calorie malnutrition: Dietitian was following.  Needs assistance with meals.  CKD stage III: Currently  kidney function at baseline.   Nutrition Problem: Severe Malnutrition Etiology: chronic illness    DVT prophylaxis:      Code Status: Limited: Do not attempt resuscitation (DNR) -DNR-LIMITED -Do Not Intubate/DNI   Family Communication: None at bedside  Patient status:Inpatient  Patient is from :Home  Anticipated discharge to:not sure  Estimated DC date:not sure   Consultants: Psychiatry, neurology  Procedures: None yet  Antimicrobials:  Anti-infectives (From admission, onward)    None       Subjective: Patient seen at bedside.  Same as yesterday.  Inside the cage.    Nonverbal, disoriented, not agitated  Objective: Vitals:   11/13/23 0500 11/14/23 0719 11/16/23 1900 11/18/23 1249  BP:   122/76 98/63  Pulse:   85 61  Resp:   15   Temp:   98.2 F (36.8 C)   TempSrc:   Tympanic   SpO2:      Weight: 40 kg 40 kg    Height:        Intake/Output Summary (Last 24 hours) at 11/19/2023 1130 Last data filed at 11/19/2023 0835 Gross per 24 hour  Intake 240 ml  Output --  Net 240 ml    Filed Weights   11/12/23 0455 11/13/23 0500 11/14/23 0719  Weight: 40 kg 40 kg 40 kg    Examination:  General exam: Inside  the cage,confused, nonverbal, disoriented  Data Reviewed: I have personally reviewed following labs and imaging studies  CBC: No results for input(s): WBC, NEUTROABS, HGB, HCT, MCV, PLT in the last 168 hours. Basic Metabolic Panel: No results for input(s): NA, K, CL, CO2, GLUCOSE, BUN, CREATININE, CALCIUM , MG, PHOS in the last 168 hours.   No results found for this or any previous  visit (from the past 240 hours).   Radiology Studies: No results found.  Scheduled Meds:  feeding supplement  237 mL Oral BID BM   LORazepam   1 mg Oral TID   Continuous Infusions:   LOS: 251 days   Ivonne Mustache, MD [facility] P7/30/2025, 11:30 AM

## 2023-11-19 NOTE — Plan of Care (Signed)
 Pt not verbally responsive. Would not cooperate for vital signs to be taken. Pt compliant w/ PO medication admin. Enclosure bed order active. Frequent rounding performed, door open for safety.   Problem: Education: Goal: Knowledge of General Education information will improve Description: Including pain rating scale, medication(s)/side effects and non-pharmacologic comfort measures Outcome: Not Progressing   Problem: Health Behavior/Discharge Planning: Goal: Ability to manage health-related needs will improve Outcome: Not Progressing   Problem: Clinical Measurements: Goal: Ability to maintain clinical measurements within normal limits will improve Outcome: Not Progressing Goal: Will remain free from infection Outcome: Not Progressing Goal: Diagnostic test results will improve Outcome: Not Progressing Goal: Respiratory complications will improve Outcome: Not Progressing Goal: Cardiovascular complication will be avoided Outcome: Not Progressing   Problem: Activity: Goal: Risk for activity intolerance will decrease Outcome: Not Progressing   Problem: Nutrition: Goal: Adequate nutrition will be maintained Outcome: Not Progressing   Problem: Coping: Goal: Level of anxiety will decrease Outcome: Not Progressing   Problem: Elimination: Goal: Will not experience complications related to bowel motility Outcome: Not Progressing Goal: Will not experience complications related to urinary retention Outcome: Not Progressing   Problem: Pain Managment: Goal: General experience of comfort will improve and/or be controlled Outcome: Not Progressing   Problem: Safety: Goal: Ability to remain free from injury will improve Outcome: Not Progressing   Problem: Skin Integrity: Goal: Risk for impaired skin integrity will decrease Outcome: Not Progressing   Problem: Safety: Goal: Violent Restraint(s) Outcome: Not Progressing

## 2023-11-19 NOTE — Plan of Care (Signed)
  Problem: Elimination: Goal: Will not experience complications related to bowel motility Outcome: Progressing Goal: Will not experience complications related to urinary retention Outcome: Progressing   Problem: Safety: Goal: Ability to remain free from injury will improve Outcome: Progressing   Problem: Skin Integrity: Goal: Risk for impaired skin integrity will decrease Outcome: Progressing   Problem: Safety: Goal: Violent Restraint(s) Outcome: Progressing

## 2023-11-20 NOTE — Progress Notes (Signed)
 PROGRESS NOTE  Natalie Brown  FMW:[id number] DOB: 16-Mar-1966 DOA: 01/22/2023 PCP: [name], NP   Brief Narrative: Patient is a 58 year old female with past medical history of bipolar disorder, early onset dementia who was initially admitted for failure to thrive .  Patient had a long and complicated hospitalization spanning 2 years.  She was admitted on 2003 with increasingly erratic behavior and cognitive impairment.  She was evaluated by psychiatry and neurology but no treatable or reversible cause of her impairment was found.  Her hospital course was complicated by weight loss, malnutrition, failure to thrive .  She was briefly placed in a memory care unit in October 2020 for an arriving at the facility, she exhibited agitation so she was denied entry and sent back to the hospital the same day.  Patient has remained in the hospital since then.  [name] nonverbal and does not follow any commands.  She can ambulate with assistance.  Currently she is on comfort care.  TOC following for disposition.  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  Currently on full comfort care.  Patient is mostly nonverbal and does not follow any commands.  She ambulates with assistance.Mostly kept on cage through out the day. Currently on Ativan  1 mg ,3 times daily.  Continue assisting with meals.  Restraints order are there  if agitated.  TOC following for disposition.  Severe protein calorie malnutrition: Dietitian was following.  Needs assistance with meals.  CKD stage III: Currently  kidney function at baseline.   Nutrition Problem: Severe Malnutrition Etiology: chronic illness    DVT prophylaxis:     Code  Status: Limited: Do not attempt resuscitation (DNR) -DNR-LIMITED -Do Not Intubate/DNI   Family Communication: None at bedside  Patient status:Inpatient  Patient is from :Home  Anticipated discharge to:not sure  Estimated DC date:not sure   Consultants: Psychiatry, neurology  Procedures: None yet  Antimicrobials:  Anti-infectives (From admission, onward)    None       Subjective: Patient seen at bedside today.  She was sleeping inside the cage.  Not in any kind of distress, nonverbal, disoriented.  Not agitated  Objective: Vitals:   11/14/23 0719 11/16/23 1900 11/18/23 1249 11/19/23 2105  BP:  122/76 98/63 (!) 83/53  Pulse:  85 61 70  Resp:  15  18  Temp:  98.2 F (36.8 C)  98.9 F (37.2 C)  TempSrc:  Tympanic  Axillary  SpO2:    100%  Weight: 40 kg     Height:       No intake or output data in the 24 hours ending 11/20/23 1109   Filed Weights   11/12/23 0455 11/13/23 0500 11/14/23 0719  Weight: 40 kg 40 kg 40 kg    Examination:  General exam: Inside the cage, nonverbal, disoriented  Data Reviewed: I have personally reviewed following labs and imaging studies  CBC: No results for input(s): WBC, NEUTROABS, HGB, HCT, MCV, PLT in the last 168 hours. Basic Metabolic Panel: No results for input(s): NA, K, CL, CO2, GLUCOSE, BUN, CREATININE, CALCIUM , MG, PHOS in the last 168 hours.   No results found for this or any previous visit (from the past 240 hours).   Radiology Studies: No results  found.  Scheduled Meds:  feeding supplement  237 mL Oral BID BM   LORazepam   1 mg Oral TID   Continuous Infusions:   LOS: 252 days   Ivonne Mustache, MD [facility] P7/31/2025, 11:09 AM

## 2023-11-20 NOTE — Plan of Care (Signed)

## 2023-11-21 MED ORDER — DIAZEPAM 5 MG/ML IJ SOLN
2.5000 mg | Freq: Four times a day (QID) | INTRAMUSCULAR | Status: DC | PRN
  Administered 2023-12-30: 2.5 mg via INTRAMUSCULAR
  Filled 2023-11-21 (×2): qty 2

## 2023-11-21 NOTE — Plan of Care (Signed)
  Problem: Activity: Goal: Risk for activity intolerance will decrease Outcome: Progressing   Problem: Nutrition: Goal: Adequate nutrition will be maintained Outcome: Progressing   Problem: Safety: Goal: Ability to remain free from injury will improve Outcome: Progressing   Problem: Skin Integrity: Goal: Risk for impaired skin integrity will decrease Outcome: Progressing   Problem: Safety: Goal: Violent Restraint(s) Outcome: Progressing

## 2023-11-21 NOTE — Plan of Care (Signed)

## 2023-11-21 NOTE — Progress Notes (Signed)
 PROGRESS NOTE  KHADIJAH MASTRIANNI  FMW:[id number] DOB: [birth year] DOA: 01/22/2023 PCP: [name], NP   Brief Narrative: Patient is a 58 year old female with past medical history of bipolar disorder, early onset dementia who was initially admitted for failure to thrive .  Patient had a long and complicated hospitalization spanning 2 years.  She was admitted on 2003 with increasingly erratic behavior and cognitive impairment.  She was evaluated by psychiatry and neurology but no treatable or reversible cause of her impairment was found.  Her hospital course was complicated by weight loss, malnutrition, failure to thrive .  She was briefly placed in a memory care unit in October 2020 for an arriving at the facility, she exhibited agitation so she was denied entry and sent back to the hospital the same day.  Patient has remained in the hospital since then.  [name] nonverbal and does not follow any commands.  She can ambulate with assistance.  Currently she is on comfort care.  TOC following for disposition.  11/21/2023: No new changes.  Patient is comfortable.  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  Currently on full comfort care.  Patient is mostly nonverbal and does not follow any commands.  She ambulates with assistance.Mostly kept on cage through out the day. Currently on Ativan  1 mg ,3 times daily.  Continue assisting with meals.  Restraints order are there  if agitated.  TOC following for disposition.  Severe protein calorie malnutrition: Dietitian was following.  Needs assistance with meals.  CKD stage III: Currently  kidney function at baseline.   Nutrition Problem: Severe Malnutrition Etiology:  chronic illness    DVT prophylaxis:     Code Status: Limited: Do not attempt resuscitation (DNR) -DNR-LIMITED -Do Not Intubate/DNI   Family Communication: None at bedside  Patient status:Inpatient  Patient is from :Home  Anticipated discharge to:not sure  Estimated DC date:not sure   Consultants: Psychiatry, neurology  Procedures: None yet  Antimicrobials:  Anti-infectives (From admission, onward)    None       Subjective: Patient seen at bedside today.  She was sleeping inside the cage.  Not in any kind of distress, nonverbal, disoriented.  Not agitated  Objective: Vitals:   11/14/23 0719 11/16/23 1900 11/18/23 1249 11/19/23 2105  BP:  122/76 98/63 (!) 83/53  Pulse:  85 61 70  Resp:  15  18  Temp:  98.2 F (36.8 C)  98.9 F (37.2 C)  TempSrc:  Tympanic  Axillary  SpO2:    100%  Weight: 40 kg     Height:       No intake or output data in the 24 hours ending 11/21/23 1606   Filed Weights   11/12/23 0455 11/13/23 0500 11/14/23 0719  Weight: 40 kg 40 kg 40 kg    Examination:  General exam: Inside the cage, nonverbal, disoriented  Data Reviewed: I have personally reviewed following labs and imaging studies  CBC: No results for input(s): WBC, NEUTROABS, HGB, HCT, MCV, PLT in the last 168 hours. Basic Metabolic Panel: No results for input(s): NA, K, CL, CO2, GLUCOSE, BUN, CREATININE, CALCIUM , MG, PHOS in the last 168 hours.   No results found for this or any previous visit (from the  past 240 hours).   Radiology Studies: No results found.  Scheduled Meds:  feeding supplement  237 mL Oral BID BM   LORazepam   1 mg Oral TID   Continuous Infusions:   LOS: 253 days   Leatrice LILLETTE Chapel, MD [facility] P8/04/2023, 4:06 PM

## 2023-11-22 NOTE — Progress Notes (Signed)
 PROGRESS NOTE  Natalie Brown  FMW:[id number] DOB: [birth year] DOA: 01/22/2023 PCP: [name], NP   Brief Narrative: Patient is a 58 year old female with past medical history of bipolar disorder, early onset dementia who was initially admitted for failure to thrive .  Patient has had a long and complicated hospitalization spanning 2 years.  She was admitted in 2023 with increasingly erratic behavior and cognitive impairment.  She was evaluated by psychiatry and neurology but no treatable or reversible cause of her impairment was found.  Her hospital course was complicated by weight loss, malnutrition, failure to thrive .  She was briefly placed in a memory care unit in October 2024.  Upon arriving at the facility, patient exhibited agitation so patient was denied entry and sent back to the hospital the same day.  Patient has remained in the hospital since then.  Patient is nonverbal and does not follow any commands.  Patient can ambulate with assistance.  Currently, the goal of care is comfort care.  TOC team is following patient, and assisting with possible disposition.    11/22/2023: No new changes.  Patient is comfortable.  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  Currently on full comfort care.  Patient is mostly nonverbal and does not follow any commands.  She ambulates with assistance.Mostly kept on cage through out the day. Currently on Ativan  1 mg ,3 times daily.  Continue assisting with meals.  Restraints order are there  if agitated.  TOC following for disposition.  Severe protein calorie malnutrition: Dietitian was following.  Needs assistance with meals.  CKD stage III: Currently  kidney  function at baseline.   Nutrition Problem: Severe Malnutrition Etiology: chronic illness    DVT prophylaxis:     Code Status: Limited: Do not attempt resuscitation (DNR) -DNR-LIMITED -Do Not Intubate/DNI   Family Communication: None at bedside  Patient status:Inpatient  Patient is from :Home  Anticipated discharge to:not sure  Estimated DC date:not sure   Consultants: Psychiatry, neurology and palliative care  Procedures: None yet  Antimicrobials:  Anti-infectives (From admission, onward)    None       Subjective: Patient seen at bedside today.  She was sleeping inside the cage.  Not in any kind of distress, nonverbal, disoriented.  Not agitated  Objective: Vitals:   11/14/23 0719 11/16/23 1900 11/18/23 1249 11/19/23 2105  BP:  122/76 98/63 (!) 83/53  Pulse:  85 61 70  Resp:  15  18  Temp:  98.2 F (36.8 C)  98.9 F (37.2 C)  TempSrc:  Tympanic  Axillary  SpO2:    100%  Weight: 40 kg     Height:        Intake/Output Summary (Last 24 hours) at 11/22/2023 1404 Last data filed at 11/22/2023 1030 Gross per 24 hour  Intake 236 ml  Output --  Net 236 ml     Filed Weights   11/12/23 0455 11/13/23 0500 11/14/23 0719  Weight: 40 kg 40 kg 40 kg    Examination:  General exam: Inside the cage, nonverbal, disoriented  Data Reviewed: I have personally reviewed following labs and imaging studies  CBC: No results for input(s): WBC, NEUTROABS, HGB, HCT, MCV, PLT in the last 168 hours. Basic  Metabolic Panel: No results for input(s): NA, K, CL, CO2, GLUCOSE, BUN, CREATININE, CALCIUM , MG, PHOS in the last 168 hours.   No results found for this or any previous visit (from the past 240 hours).   Radiology Studies: No results found.  Scheduled Meds:  feeding supplement  237 mL Oral BID BM   LORazepam   1 mg Oral TID   Continuous Infusions:   LOS: 254 days   Natalie LILLETTE Chapel, MD [facility] P8/05/2023, 2:04 PM

## 2023-11-22 NOTE — Plan of Care (Signed)

## 2023-11-23 NOTE — Progress Notes (Signed)
 PROGRESS NOTE  Natalie Brown  FMW:[id number] DOB: 1966/04/06 DOA: 01/22/2023 PCP: [name], NP   Brief Narrative: Patient is a 58 year old female with past medical history of bipolar disorder, early onset dementia who was initially admitted for failure to thrive .  Patient has had a long and complicated hospitalization spanning 2 years.  She was admitted in 2023 with increasingly erratic behavior and cognitive impairment.  She was evaluated by psychiatry and neurology but no treatable or reversible cause of her impairment was found.  Her hospital course was complicated by weight loss, malnutrition, failure to thrive .  She was briefly placed in a memory care unit in October 2024.  Upon arriving at the facility, patient exhibited agitation so patient was denied entry and sent back to the hospital the same day.  Patient has remained in the hospital since then.  Patient is nonverbal and does not follow any commands.  Patient can ambulate with assistance.  Currently, the goal of care is comfort care.  TOC team is following patient, and assisting with possible disposition.    11/23/2023: No new changes.  Patient is comfortable.  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  Currently on full comfort care.  Patient is mostly nonverbal and does not follow any commands.  She ambulates with assistance.Mostly kept on cage through out the day. Currently on Ativan  1 mg ,3 times daily.  Continue assisting with meals.  Restraints order are there  if agitated.  TOC following for disposition.  Severe protein calorie malnutrition: Dietitian was following.  Needs assistance with meals.  CKD stage III: Currently  kidney  function at baseline.   Nutrition Problem: Severe Malnutrition Etiology: chronic illness    DVT prophylaxis:     Code Status: Limited: Do not attempt resuscitation (DNR) -DNR-LIMITED -Do Not Intubate/DNI   Family Communication: None at bedside  Patient status:Inpatient  Patient is from :Home  Anticipated discharge to:not sure  Estimated DC date:not sure   Consultants: Psychiatry, neurology and palliative care  Procedures: None yet  Antimicrobials:  Anti-infectives (From admission, onward)    None       Subjective: Patient seen at bedside today.  She was sleeping inside the cage.  Not in any kind of distress, nonverbal, disoriented.  Not agitated  Objective: Vitals:   11/14/23 0719 11/16/23 1900 11/18/23 1249 11/19/23 2105  BP:  122/76 98/63 (!) 83/53  Pulse:  85 61 70  Resp:  15  18  Temp:  98.2 F (36.8 C)  98.9 F (37.2 C)  TempSrc:  Tympanic  Axillary  SpO2:    100%  Weight: 40 kg     Height:        Intake/Output Summary (Last 24 hours) at 11/23/2023 1745 Last data filed at 11/23/2023 1249 Gross per 24 hour  Intake 220 ml  Output --  Net 220 ml     Filed Weights   11/12/23 0455 11/13/23 0500 11/14/23 0719  Weight: 40 kg 40 kg 40 kg    Examination:  General exam: Inside the cage, nonverbal, disoriented  Data Reviewed: I have personally reviewed following labs and imaging studies  CBC: No results for input(s): WBC, NEUTROABS, HGB, HCT, MCV, PLT in the last 168 hours. Basic  Metabolic Panel: No results for input(s): NA, K, CL, CO2, GLUCOSE, BUN, CREATININE, CALCIUM , MG, PHOS in the last 168 hours.   No results found for this or any previous visit (from the past 240 hours).   Radiology Studies: No results found.  Scheduled Meds:  feeding supplement  237 mL Oral BID BM   LORazepam   1 mg Oral TID   Continuous Infusions:   LOS: 255 days   Leatrice LILLETTE Chapel, MD [facility] P8/06/2023, 5:45 PM

## 2023-11-23 NOTE — Plan of Care (Signed)
  Problem: Education: Goal: Knowledge of General Education information will improve Description: Including pain rating scale, medication(s)/side effects and non-pharmacologic comfort measures 11/23/2023 0400 by Neda Rosaline LABOR, RN Outcome: Not Progressing 11/23/2023 0359 by Neda Rosaline LABOR, RN Outcome: Progressing   Problem: Health Behavior/Discharge Planning: Goal: Ability to manage health-related needs will improve 11/23/2023 0400 by Neda Rosaline LABOR, RN Outcome: Not Progressing 11/23/2023 0359 by Neda Rosaline LABOR, RN Outcome: Progressing   Problem: Clinical Measurements: Goal: Ability to maintain clinical measurements within normal limits will improve 11/23/2023 0400 by Neda Rosaline LABOR, RN Outcome: Not Progressing 11/23/2023 0359 by Neda Rosaline LABOR, RN Outcome: Progressing Goal: Will remain free from infection 11/23/2023 0400 by Neda Rosaline LABOR, RN Outcome: Not Progressing 11/23/2023 0359 by Neda Rosaline LABOR, RN Outcome: Progressing Goal: Diagnostic test results will improve 11/23/2023 0400 by Neda Rosaline LABOR, RN Outcome: Not Progressing 11/23/2023 0359 by Neda Rosaline LABOR, RN Outcome: Progressing Goal: Respiratory complications will improve 11/23/2023 0400 by Neda Rosaline LABOR, RN Outcome: Not Progressing 11/23/2023 0359 by Neda Rosaline LABOR, RN Outcome: Progressing Goal: Cardiovascular complication will be avoided 11/23/2023 0400 by Neda Rosaline LABOR, RN Outcome: Not Progressing 11/23/2023 0359 by Neda Rosaline LABOR, RN Outcome: Progressing   Problem: Activity: Goal: Risk for activity intolerance will decrease 11/23/2023 0400 by Neda Rosaline LABOR, RN Outcome: Not Progressing 11/23/2023 0359 by Neda Rosaline LABOR, RN Outcome: Progressing   Problem: Nutrition: Goal: Adequate nutrition will be maintained 11/23/2023 0400 by Neda Rosaline LABOR, RN Outcome: Not Progressing 11/23/2023 0359 by Neda Rosaline LABOR, RN Outcome: Progressing   Problem: Coping: Goal: Level of anxiety will  decrease 11/23/2023 0400 by Neda Rosaline LABOR, RN Outcome: Not Progressing 11/23/2023 0359 by Neda Rosaline LABOR, RN Outcome: Progressing   Problem: Elimination: Goal: Will not experience complications related to bowel motility 11/23/2023 0400 by Neda Rosaline LABOR, RN Outcome: Not Progressing 11/23/2023 0359 by Neda Rosaline LABOR, RN Outcome: Progressing Goal: Will not experience complications related to urinary retention 11/23/2023 0400 by Neda Rosaline LABOR, RN Outcome: Not Progressing 11/23/2023 0359 by Neda Rosaline LABOR, RN Outcome: Progressing   Problem: Pain Managment: Goal: General experience of comfort will improve and/or be controlled 11/23/2023 0400 by Neda Rosaline LABOR, RN Outcome: Not Progressing 11/23/2023 0359 by Neda Rosaline LABOR, RN Outcome: Progressing   Problem: Safety: Goal: Ability to remain free from injury will improve 11/23/2023 0400 by Neda Rosaline LABOR, RN Outcome: Not Progressing 11/23/2023 0359 by Neda Rosaline LABOR, RN Outcome: Progressing   Problem: Skin Integrity: Goal: Risk for impaired skin integrity will decrease 11/23/2023 0400 by Neda Rosaline LABOR, RN Outcome: Not Progressing 11/23/2023 0359 by Neda Rosaline LABOR, RN Outcome: Progressing   Problem: Safety: Goal: Violent Restraint(s) 11/23/2023 0400 by Neda Rosaline LABOR, RN Outcome: Not Progressing 11/23/2023 0359 by Neda Rosaline LABOR, RN Outcome: Progressing

## 2023-11-23 NOTE — Plan of Care (Signed)

## 2023-11-23 NOTE — Progress Notes (Incomplete)
 Unable to assess pt. Mentation.  Pt resting in bed.  All locks secured and fastened on Enclosure bed secure.  Pt ate 75% of lunch and > 75% if dinner.

## 2023-11-23 NOTE — Plan of Care (Signed)
  Problem: Clinical Measurements: Goal: Ability to maintain clinical measurements within normal limits will improve Outcome: Progressing Goal: Will remain free from infection Outcome: Progressing Goal: Respiratory complications will improve Outcome: Progressing Goal: Cardiovascular complication will be avoided Outcome: Progressing   Problem: Activity: Goal: Risk for activity intolerance will decrease Outcome: Progressing   Problem: Nutrition: Goal: Adequate nutrition will be maintained Outcome: Progressing   Problem: Elimination: Goal: Will not experience complications related to bowel motility Outcome: Progressing Goal: Will not experience complications related to urinary retention Outcome: Progressing   Problem: Safety: Goal: Ability to remain free from injury will improve Outcome: Progressing

## 2023-11-24 NOTE — Plan of Care (Signed)
   Problem: Education: Goal: Knowledge of General Education information will improve Description Including pain rating scale, medication(s)/side effects and non-pharmacologic comfort measures Outcome: Progressing

## 2023-11-24 NOTE — Progress Notes (Signed)
 PROGRESS NOTE  Natalie Brown  FMW:[id number] DOB: 22-Dec-1965 DOA: 01/22/2023 PCP: [name], NP   Brief Narrative: Patient is a 58 year old female with PMH of bipolar disorder, early onset dementia who was initially admitted for failure to thrive .  Patient has had a long and complicated hospitalization spanning 2 years.  She was admitted in 2023 with increasingly erratic behavior and cognitive impairment.  She was evaluated by psychiatry and neurology but no treatable or reversible cause of her impairment was found.  Her hospital course was complicated by weight loss, malnutrition, failure to thrive .  She was briefly placed in a memory care unit in October 2024.  Upon arriving at the facility, patient exhibited agitation so patient was denied entry and sent back to the hospital the same day.  Patient has remained in the hospital since then.  Patient is nonverbal and does not follow any commands.  Patient can ambulate with assistance.  Currently, the goal of care is comfort care.  TOC team is following patient, and assisting with possible disposition.    11/24/2023: No new changes.  Patient is comfortable.  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  Currently on full comfort care.  Patient is mostly nonverbal and does not follow any commands.  She ambulates with assistance.Mostly kept on cage through out the day. Currently on Ativan  1 mg ,3 times daily.  Continue assisting with meals.  Restraints order are there  if agitated.  TOC following for disposition.  CKD stage III: Currently  kidney function at baseline.  Severe protein calorie malnutrition: Dietitian was following.  Needs assistance with  meals. Nutrition Problem: Severe Malnutrition Etiology: chronic illness    DVT prophylaxis:     Code Status: Limited: Do not attempt resuscitation (DNR) -DNR-LIMITED -Do Not Intubate/DNI   Family Communication: None at bedside  Patient status:Inpatient  Patient is from :Home  Anticipated discharge to:not sure  Estimated DC date:not sure   Consultants: Psychiatry, neurology and palliative care  Procedures: None yet  Antimicrobials:  Anti-infectives (From admission, onward)    None       Subjective: Patient seen at bedside today.  She was sleeping inside the cage.  Not in any kind of distress, nonverbal, disoriented.  Not agitated  Objective: Vitals:   11/14/23 0719 11/16/23 1900 11/18/23 1249 11/19/23 2105  BP:  122/76 98/63 (!) 83/53  Pulse:  85 61 70  Resp:  15  18  Temp:  98.2 F (36.8 C)  98.9 F (37.2 C)  TempSrc:  Tympanic  Axillary  SpO2:    100%  Weight: 40 kg     Height:        Intake/Output Summary (Last 24 hours) at 11/24/2023 1429 Last data filed at 11/24/2023 1242 Gross per 24 hour  Intake 597 ml  Output --  Net 597 ml     Filed Weights   11/12/23 0455 11/13/23 0500 11/14/23 0719  Weight: 40 kg 40 kg 40 kg    Examination:  General exam: Inside the cage, nonverbal, disoriented  Data Reviewed: I have personally reviewed following labs and imaging studies  CBC: No results for input(s): WBC, NEUTROABS, HGB, HCT, MCV, PLT in the last 168 hours. Basic Metabolic Panel: No results  for input(s): NA, K, CL, CO2, GLUCOSE, BUN, CREATININE, CALCIUM , MG, PHOS in the last 168 hours.   No results found for this or any previous visit (from the past 240 hours).   Radiology Studies: No results found.  Scheduled Meds:  feeding supplement  237 mL Oral BID BM   LORazepam   1 mg Oral TID   Continuous Infusions:   LOS: 256 days   [name], MD [facility] P8/07/2023, 2:29 PM

## 2023-11-24 NOTE — Plan of Care (Signed)
  Problem: Clinical Measurements: Goal: Will remain free from infection Outcome: Progressing   Problem: Nutrition: Goal: Adequate nutrition will be maintained Outcome: Progressing   Problem: Elimination: Goal: Will not experience complications related to bowel motility Outcome: Progressing Goal: Will not experience complications related to urinary retention Outcome: Progressing   Problem: Safety: Goal: Ability to remain free from injury will improve Outcome: Progressing   Problem: Skin Integrity: Goal: Risk for impaired skin integrity will decrease Outcome: Progressing   Problem: Safety: Goal: Violent Restraint(s) Outcome: Progressing

## 2023-11-25 NOTE — Progress Notes (Signed)
 PROGRESS NOTE  Natalie Brown  FMW:[id number] DOB: 01/15/1966 DOA: 01/22/2023 PCP: [name], NP   Brief Narrative: Patient is a 58 year old female with PMH of bipolar disorder, early onset dementia who was initially admitted for failure to thrive .  Patient has had a long and complicated hospitalization spanning 2 years.  She was admitted in 2023 with increasingly erratic behavior and cognitive impairment.  She was evaluated by psychiatry and neurology but no treatable or reversible cause of her impairment was found.  Her hospital course was complicated by weight loss, malnutrition, failure to thrive .  She was briefly placed in a memory care unit in October 2024.  Upon arriving at the facility, patient exhibited agitation so patient was denied entry and sent back to the hospital the same day.  Patient has remained in the hospital since then.  Patient is nonverbal and does not follow any commands.  Patient can ambulate with assistance.  Currently, the goal of care is comfort care.  TOC team is following patient, and assisting with possible disposition.    Assessment & Plan: Early onset dementia with behavioral disturbances/bipolar disorder : Clinical scenario as above. Patient is mostly nonverbal and does not follow any commands.  She ambulates with assistance. Mostly kept in safety cage through out the day since she is not redirectable. -Continue Ativan  1 mg ,3 times daily -Valium  and trazodone  as needed  Full comfort care - Symptomatic management as above.  CKD-3: Stable  Severe protein calorie malnutrition: Dietitian was following.  Needs assistance with meals. Nutrition Problem: Severe Malnutrition Etiology: chronic illness    DVT prophylaxis:     Code Status: Limited: Do not attempt resuscitation (DNR) -DNR-LIMITED -Do Not Intubate/DNI   Family Communication: None at bedside  Patient status:Inpatient  Patient is from :Home  Anticipated discharge to:not sure  Estimated  DC date:not sure   Consultants: Psychiatry, neurology and palliative care  Procedures: None yet  Antimicrobials:  Anti-infectives (From admission, onward)    None       Subjective: No major events overnight of this morning.  Sleeping comfortably.  Objective: Vitals:   11/16/23 1900 11/18/23 1249 11/19/23 2105 11/24/23 1953  BP:  98/63 (!) 83/53 92/71  Pulse: 85 61 70 (!) 103  Resp: 15  18 19   Temp: 98.2 F (36.8 C)  98.9 F (37.2 C)   TempSrc: Tympanic  Axillary   SpO2:   100%   Weight:      Height:        Intake/Output Summary (Last 24 hours) at 11/25/2023 1107 Last data filed at 11/25/2023 0852 Gross per 24 hour  Intake 711 ml  Output --  Net 711 ml     Filed Weights   11/12/23 0455 11/13/23 0500 11/14/23 0719  Weight: 40 kg 40 kg 40 kg    Examination:  General exam: Sleeping comfortably.  Nonverbal.  No distress.  Data Reviewed: I have personally reviewed following labs and imaging studies  CBC: No results for input(s): WBC, NEUTROABS, HGB, HCT, MCV, PLT in the last 168 hours. Basic Metabolic Panel: No results for input(s): NA, K, CL, CO2, GLUCOSE, BUN, CREATININE, CALCIUM , MG, PHOS in the last 168 hours.   No results found for this or any previous visit (from the past 240 hours).   Radiology Studies: No results found.  Scheduled Meds:  feeding supplement  237 mL Oral BID BM   LORazepam   1 mg Oral TID   Continuous Infusions:   LOS: 257 days  [name], MD [facility] P8/08/2023, 11:07 AM

## 2023-11-25 NOTE — Plan of Care (Signed)

## 2023-11-26 NOTE — Progress Notes (Signed)
 PROGRESS NOTE  Natalie Brown  FMW:[id number] DOB: 09-17-1965 DOA: 01/22/2023 PCP: [name], NP   Brief Narrative: Patient is a 58 year old female with PMH of bipolar disorder, early onset dementia who was initially admitted for failure to thrive .  Patient has had a long and complicated hospitalization spanning 2 years.  She was admitted in 2023 with increasingly erratic behavior and cognitive impairment.  She was evaluated by psychiatry and neurology but no treatable or reversible cause of her impairment was found.  Her hospital course was complicated by weight loss, malnutrition, failure to thrive .  She was briefly placed in a memory care unit in October 2024.  Upon arriving at the facility, patient exhibited agitation so patient was denied entry and sent back to the hospital the same day.  Patient has remained in the hospital since then.  Patient is nonverbal and does not follow any commands.  Patient can ambulate with assistance.  Currently, the goal of care is comfort care.  TOC team is following patient, and assisting with possible disposition.    Assessment & Plan: Early onset dementia with behavioral disturbances/bipolar disorder : Clinical scenario as above. Patient is mostly nonverbal and does not follow any commands.  She ambulates with assistance. Mostly kept in safety cage through out the day since she is not redirectable. -Continue Ativan  1 mg ,3 times daily -Valium  and trazodone  as needed  Full comfort care - Symptomatic management as above.  CKD-3: Stable  Severe protein calorie malnutrition: Dietitian was following.  Needs assistance with meals. Nutrition Problem: Severe Malnutrition Etiology: chronic illness    DVT prophylaxis:     Code Status: Limited: Do not attempt resuscitation (DNR) -DNR-LIMITED -Do Not Intubate/DNI   Family Communication: None at bedside  Patient status:Inpatient  Patient is from :Home  Anticipated discharge to:not sure  Estimated  DC date:not sure   Consultants: Psychiatry, neurology and palliative care  Procedures: None yet  Antimicrobials:  Anti-infectives (From admission, onward)    None       Subjective: No major events overnight of this morning.  Sleeping comfortably.  Objective: Vitals:   11/16/23 1900 11/18/23 1249 11/19/23 2105 11/24/23 1953  BP:  98/63 (!) 83/53 92/71  Pulse: 85 61 70 (!) 103  Resp: 15  18 19   Temp: 98.2 F (36.8 C)  98.9 F (37.2 C)   TempSrc: Tympanic  Axillary   SpO2:   100%   Weight:      Height:        Intake/Output Summary (Last 24 hours) at 11/26/2023 0954 Last data filed at 11/26/2023 0900 Gross per 24 hour  Intake 713 ml  Output --  Net 713 ml     Filed Weights   11/12/23 0455 11/13/23 0500 11/14/23 0719  Weight: 40 kg 40 kg 40 kg    Examination:  General exam: Sleeping comfortably.  Nonverbal.  No distress.  Data Reviewed: I have personally reviewed following labs and imaging studies  CBC: No results for input(s): WBC, NEUTROABS, HGB, HCT, MCV, PLT in the last 168 hours. Basic Metabolic Panel: No results for input(s): NA, K, CL, CO2, GLUCOSE, BUN, CREATININE, CALCIUM , MG, PHOS in the last 168 hours.   No results found for this or any previous visit (from the past 240 hours).   Radiology Studies: No results found.  Scheduled Meds:  feeding supplement  237 mL Oral BID BM   LORazepam   1 mg Oral TID   Continuous Infusions:   LOS: 258 days  [name], MD [facility] P8/09/2023, 9:54 AM

## 2023-11-26 NOTE — Plan of Care (Signed)
  Problem: Clinical Measurements: Goal: Will remain free from infection Outcome: Progressing   Problem: Activity: Goal: Risk for activity intolerance will decrease Outcome: Progressing   Problem: Nutrition: Goal: Adequate nutrition will be maintained Outcome: Progressing   Problem: Elimination: Goal: Will not experience complications related to bowel motility Outcome: Progressing   Problem: Education: Goal: Knowledge of General Education information will improve Description: Including pain rating scale, medication(s)/side effects and non-pharmacologic comfort measures Outcome: Not Progressing   Problem: Health Behavior/Discharge Planning: Goal: Ability to manage health-related needs will improve Outcome: Not Progressing

## 2023-11-26 NOTE — Plan of Care (Signed)
  Problem: Education: Goal: Knowledge of General Education information will improve Description: Including pain rating scale, medication(s)/side effects and non-pharmacologic comfort measures Outcome: Not Progressing   Problem: Health Behavior/Discharge Planning: Goal: Ability to manage health-related needs will improve Outcome: Not Progressing   Problem: Clinical Measurements: Goal: Ability to maintain clinical measurements within normal limits will improve Outcome: Not Progressing Goal: Will remain free from infection Outcome: Not Progressing Goal: Diagnostic test results will improve Outcome: Not Progressing Goal: Respiratory complications will improve Outcome: Not Progressing Goal: Cardiovascular complication will be avoided Outcome: Not Progressing   Problem: Activity: Goal: Risk for activity intolerance will decrease Outcome: Not Progressing   Problem: Nutrition: Goal: Adequate nutrition will be maintained Outcome: Not Progressing   Problem: Coping: Goal: Level of anxiety will decrease Outcome: Not Progressing   Problem: Elimination: Goal: Will not experience complications related to bowel motility Outcome: Not Progressing Goal: Will not experience complications related to urinary retention Outcome: Not Progressing   Problem: Pain Managment: Goal: General experience of comfort will improve and/or be controlled Outcome: Not Progressing   Problem: Safety: Goal: Ability to remain free from injury will improve Outcome: Not Progressing   Problem: Skin Integrity: Goal: Risk for impaired skin integrity will decrease Outcome: Not Progressing   Problem: Safety: Goal: Violent Restraint(s) Outcome: Not Progressing

## 2023-11-26 NOTE — TOC Progression Note (Signed)
 Transition of Care [facility]) - Progression Note    Patient Details  Name: Natalie Brown MRN: [id number] Date of Birth: June 02, 1965  [facility]) CM/SW Contact  Abbee Cremeens A [place], LCSW Phone Number: 11/26/2023, 3:09 PM  Clinical Narrative:     Barriers to discharge remain the same. CSW continuing to follow pt.    Expected Discharge Plan: Skilled Nursing Facility Barriers to Discharge: SNF Pending bed offer, Insurance Authorization               Expected Discharge Plan and Services In-house Referral: Clinical Social Work   Post Acute Care Choice: Skilled Nursing Facility Living arrangements for the past 2 months: Assisted Living Facility                                       Social Drivers of Health (SDOH) Interventions SDOH Screenings   Food Insecurity: No Food Insecurity (05/14/2023)  Housing: Low Risk  (01/24/2023)  Transportation Needs: No Transportation Needs (05/14/2023)  Utilities: Not At Risk (05/14/2023)  Alcohol Screen: Low Risk  (09/06/2021)  Depression (PHQ2-9): Medium Risk (05/26/2020)  Tobacco Use: High Risk (04/04/2023)    Readmission Risk Interventions     No data to display

## 2023-11-27 NOTE — Progress Notes (Signed)
Patient had a shower 

## 2023-11-27 NOTE — Progress Notes (Signed)
 PROGRESS NOTE  Natalie Brown  FMW:[id number] DOB: 1965/08/15 DOA: 01/22/2023 PCP: [name], NP   Brief Narrative: Patient is a 58 year old female with PMH of bipolar disorder, early onset dementia who was initially admitted for failure to thrive .  Patient has had a long and complicated hospitalization spanning 2 years.  She was admitted in 2023 with increasingly erratic behavior and cognitive impairment.  She was evaluated by psychiatry and neurology but no treatable or reversible cause of her impairment was found.  Her hospital course was complicated by weight loss, malnutrition, failure to thrive .  She was briefly placed in a memory care unit in October 2024.  Upon arriving at the facility, patient exhibited agitation so patient was denied entry and sent back to the hospital the same day.  Patient has remained in the hospital since then.  Patient is nonverbal and does not follow any commands.  Patient can ambulate with assistance.  Currently, the goal of care is comfort care.  TOC team is following patient, and assisting with possible disposition.    Assessment & Plan: Early onset dementia with behavioral disturbances/bipolar disorder : Clinical scenario as above. Patient is mostly nonverbal and does not follow any commands.  She ambulates with assistance. Mostly kept in safety cage through out the day since she is not redirectable. -Continue Ativan  1 mg ,3 times daily -Valium  and trazodone  as needed  Full comfort care - Symptomatic management as above.  CKD-3: Stable  Severe protein calorie malnutrition: Dietitian was following.  Needs assistance with meals. Nutrition Problem: Severe Malnutrition Etiology: chronic illness    DVT prophylaxis:     Code Status: Limited: Do not attempt resuscitation (DNR) -DNR-LIMITED -Do Not Intubate/DNI   Family Communication: None at bedside  Patient status:Inpatient  Patient is from :Home  Anticipated discharge to:not sure  Estimated  DC date:not sure   Consultants: Psychiatry, neurology and palliative care  Procedures: None yet  Antimicrobials:  Anti-infectives (From admission, onward)    None       Subjective: No major events overnight of this morning.  Sleeping comfortably.  Objective: Vitals:   11/16/23 1900 11/18/23 1249 11/19/23 2105 11/24/23 1953  BP:  98/63 (!) 83/53 92/71  Pulse: 85 61 70 (!) 103  Resp: 15  18 19   Temp: 98.2 F (36.8 C)  98.9 F (37.2 C)   TempSrc: Tympanic  Axillary   SpO2:   100%   Weight:      Height:        Intake/Output Summary (Last 24 hours) at 11/27/2023 1259 Last data filed at 11/26/2023 1901 Gross per 24 hour  Intake 236 ml  Output --  Net 236 ml     Filed Weights   11/12/23 0455 11/13/23 0500 11/14/23 0719  Weight: 40 kg 40 kg 40 kg    Examination:  General exam: Sleeping comfortably.  Nonverbal.  No distress.  Data Reviewed: I have personally reviewed following labs and imaging studies  CBC: No results for input(s): WBC, NEUTROABS, HGB, HCT, MCV, PLT in the last 168 hours. Basic Metabolic Panel: No results for input(s): NA, K, CL, CO2, GLUCOSE, BUN, CREATININE, CALCIUM , MG, PHOS in the last 168 hours.   No results found for this or any previous visit (from the past 240 hours).   Radiology Studies: No results found.  Scheduled Meds:  feeding supplement  237 mL Oral BID BM   LORazepam   1 mg Oral TID   Continuous Infusions:   LOS: 259 days  [name], MD [facility] P8/10/2023, 12:59 PM

## 2023-11-27 NOTE — Plan of Care (Signed)

## 2023-11-28 NOTE — Progress Notes (Signed)
 PROGRESS NOTE  Natalie Brown  FMW:[id number] DOB: 1965/10/09 DOA: 01/22/2023 PCP: [name], NP   Brief Narrative: Patient is a 58 year old female with PMH of bipolar disorder, early onset dementia who was initially admitted for failure to thrive .  Patient has had a long and complicated hospitalization spanning 2 years.  She was admitted in 2023 with increasingly erratic behavior and cognitive impairment.  She was evaluated by psychiatry and neurology but no treatable or reversible cause of her impairment was found.  Her hospital course was complicated by weight loss, malnutrition, failure to thrive .  She was briefly placed in a memory care unit in October 2024.  Upon arriving at the facility, patient exhibited agitation so patient was denied entry and sent back to the hospital the same day.  Patient has remained in the hospital since then.  Patient is nonverbal and does not follow any commands.  Patient can ambulate with assistance.  Currently, the goal of care is comfort care.  TOC team is following patient, and assisting with possible disposition.    Assessment & Plan: Early onset dementia with behavioral disturbances/bipolar disorder : Clinical scenario as above. Patient is mostly nonverbal and does not follow any commands.  She ambulates with assistance. Mostly kept in safety cage through out the day since she is not redirectable. -Continue Ativan  1 mg ,3 times daily -Valium  and trazodone  as needed  Full comfort care - Symptomatic management as above.  CKD-3: Stable  Severe protein calorie malnutrition: Dietitian was following.  Needs assistance with meals. Nutrition Problem: Severe Malnutrition Etiology: chronic illness    DVT prophylaxis:     Code Status: Limited: Do not attempt resuscitation (DNR) -DNR-LIMITED -Do Not Intubate/DNI   Family Communication: None at bedside  Patient status:Inpatient  Patient is from :Home  Anticipated discharge to:not sure  Estimated  DC date:not sure   Consultants: Psychiatry, neurology and palliative care  Procedures: None yet  Antimicrobials:  Anti-infectives (From admission, onward)    None       Subjective: No major events overnight of this morning.  Sleeping comfortably.  Objective: Vitals:   11/18/23 1249 11/19/23 2105 11/24/23 1953 11/28/23 0532  BP: 98/63 (!) 83/53 92/71 92/71   Pulse: 61 70 (!) 103 68  Resp:  18 19 18   Temp:  98.9 F (37.2 C)    TempSrc:  Axillary    SpO2:  100%  100%  Weight:      Height:        Intake/Output Summary (Last 24 hours) at 11/28/2023 0852 Last data filed at 11/27/2023 1822 Gross per 24 hour  Intake 720 ml  Output --  Net 720 ml     Filed Weights   11/12/23 0455 11/13/23 0500 11/14/23 0719  Weight: 40 kg 40 kg 40 kg    Examination:  General exam: Sleeping comfortably.  Nonverbal.  No distress.  Data Reviewed: I have personally reviewed following labs and imaging studies  CBC: No results for input(s): WBC, NEUTROABS, HGB, HCT, MCV, PLT in the last 168 hours. Basic Metabolic Panel: No results for input(s): NA, K, CL, CO2, GLUCOSE, BUN, CREATININE, CALCIUM , MG, PHOS in the last 168 hours.   No results found for this or any previous visit (from the past 240 hours).   Radiology Studies: No results found.  Scheduled Meds:  feeding supplement  237 mL Oral BID BM   LORazepam   1 mg Oral TID   Continuous Infusions:   LOS: 260 days   Natalie Odell Castor,  MD [facility] P8/11/2023, 8:52 AM

## 2023-11-29 NOTE — Plan of Care (Signed)

## 2023-11-29 NOTE — Progress Notes (Signed)
 PROGRESS NOTE  Natalie Brown  FMW:[id number] DOB: October 30, 1965 DOA: 01/22/2023 PCP: [name], NP   Brief Narrative: Patient is a 58 year old female with PMH of bipolar disorder, early onset dementia who was initially admitted for failure to thrive .  Patient has had a long and complicated hospitalization spanning 2 years.  She was admitted in 2023 with increasingly erratic behavior and cognitive impairment.  She was evaluated by psychiatry and neurology but no treatable or reversible cause of her impairment was found.  Her hospital course was complicated by weight loss, malnutrition, failure to thrive .  She was briefly placed in a memory care unit in October 2024.  Upon arriving at the facility, patient exhibited agitation so patient was denied entry and sent back to the hospital the same day.  Patient has remained in the hospital since then.  Patient is nonverbal and does not follow any commands.  Patient can ambulate with assistance.  Currently, the goal of care is comfort care.  TOC team is following patient, and assisting with possible disposition.    Assessment & Plan: Early onset dementia with behavioral disturbances/bipolar disorder : Clinical scenario as above. Patient is mostly nonverbal and does not follow any commands.  She ambulates with assistance. Mostly kept in safety cage through out the day since she is not redirectable. -Continue Ativan  1 mg ,3 times daily -Valium  and trazodone  as needed  Full comfort care - Symptomatic management as above.  CKD-3: Stable  Severe protein calorie malnutrition: Dietitian was following.  Needs assistance with meals. Nutrition Problem: Severe Malnutrition Etiology: chronic illness    DVT prophylaxis:     Code Status: Limited: Do not attempt resuscitation (DNR) -DNR-LIMITED -Do Not Intubate/DNI   Family Communication: None at bedside  Patient status:Inpatient  Patient is from :Home  Anticipated discharge to:not sure  Estimated  DC date:not sure   Consultants: Psychiatry, neurology and palliative care  Procedures: None yet  Antimicrobials:  Anti-infectives (From admission, onward)    None       Subjective: No major events overnight of this morning.  Sleeping comfortably.  Objective: Vitals:   11/18/23 1249 11/19/23 2105 11/24/23 1953 11/28/23 0532  BP: 98/63 (!) 83/53 92/71 92/71   Pulse: 61 70 (!) 103 68  Resp:  18 19 18   Temp:  98.9 F (37.2 C)    TempSrc:  Axillary    SpO2:  100%  100%  Weight:      Height:        Intake/Output Summary (Last 24 hours) at 11/29/2023 1430 Last data filed at 11/29/2023 0909 Gross per 24 hour  Intake 800 ml  Output --  Net 800 ml     Filed Weights   11/12/23 0455 11/13/23 0500 11/14/23 0719  Weight: 40 kg 40 kg 40 kg    Examination:  General exam: Sleeping comfortably.  Nonverbal.  No distress.  Data Reviewed: I have personally reviewed following labs and imaging studies  CBC: No results for input(s): WBC, NEUTROABS, HGB, HCT, MCV, PLT in the last 168 hours. Basic Metabolic Panel: No results for input(s): NA, K, CL, CO2, GLUCOSE, BUN, CREATININE, CALCIUM , MG, PHOS in the last 168 hours.   No results found for this or any previous visit (from the past 240 hours).   Radiology Studies: No results found.  Scheduled Meds:  feeding supplement  237 mL Oral BID BM   LORazepam   1 mg Oral TID   Continuous Infusions:   LOS: 261 days   Natalie ONEIDA Bump,  MD [facility] P8/12/2023, 2:30 PM

## 2023-11-30 NOTE — Progress Notes (Signed)
 PROGRESS NOTE  Natalie Brown  FMW:[id number] DOB: 07/27/1965 DOA: 01/22/2023 PCP: [name], NP   Brief Narrative: Patient is a 58 year old female with PMH of bipolar disorder, early onset dementia who was initially admitted for failure to thrive .  Patient has had a long and complicated hospitalization spanning 2 years.  She was admitted in 2023 with increasingly erratic behavior and cognitive impairment.  She was evaluated by psychiatry and neurology but no treatable or reversible cause of her impairment was found.  Her hospital course was complicated by weight loss, malnutrition, failure to thrive .  She was briefly placed in a memory care unit in October 2024.  Upon arriving at the facility, patient exhibited agitation so patient was denied entry and sent back to the hospital the same day.  Patient has remained in the hospital since then.  Patient is nonverbal and does not follow any commands.  Patient can ambulate with assistance.  Currently, the goal of care is comfort care.  TOC team is following patient, and assisting with possible disposition.    Assessment & Plan: Early onset dementia with behavioral disturbances/bipolar disorder : Clinical scenario as above. Patient is mostly nonverbal and does not follow any commands.  She ambulates with assistance. Mostly kept in safety cage through out the day since she is not redirectable. -Continue Ativan  1 mg ,3 times daily -Valium  and trazodone  as needed  Full comfort care - Symptomatic management as above.  CKD-3: Stable  Severe protein calorie malnutrition: Dietitian was following.  Needs assistance with meals. Nutrition Problem: Severe Malnutrition Etiology: chronic illness    DVT prophylaxis:     Code Status: Limited: Do not attempt resuscitation (DNR) -DNR-LIMITED -Do Not Intubate/DNI   Family Communication: None at bedside  Patient status:Inpatient  Patient is from :Home  Anticipated discharge to:not sure  Estimated  DC date:not sure   Consultants: Psychiatry, neurology and palliative care  Procedures: None yet  Antimicrobials:  Anti-infectives (From admission, onward)    None       Subjective: No major events overnight of this morning.  Sleeping comfortably.  Objective: Vitals:   11/18/23 1249 11/19/23 2105 11/24/23 1953 11/28/23 0532  BP: 98/63 (!) 83/53 92/71 92/71   Pulse: 61 70 (!) 103 68  Resp:  18 19 18   Temp:  98.9 F (37.2 C)    TempSrc:  Axillary    SpO2:  100%  100%  Weight:      Height:        Intake/Output Summary (Last 24 hours) at 11/30/2023 1103 Last data filed at 11/30/2023 0900 Gross per 24 hour  Intake 827 ml  Output --  Net 827 ml     Filed Weights   11/12/23 0455 11/13/23 0500 11/14/23 0719  Weight: 40 kg 40 kg 40 kg    Examination:  General exam: Sleeping comfortably.  Nonverbal.  No distress.  Data Reviewed: I have personally reviewed following labs and imaging studies  CBC: No results for input(s): WBC, NEUTROABS, HGB, HCT, MCV, PLT in the last 168 hours. Basic Metabolic Panel: No results for input(s): NA, K, CL, CO2, GLUCOSE, BUN, CREATININE, CALCIUM , MG, PHOS in the last 168 hours.   No results found for this or any previous visit (from the past 240 hours).   Radiology Studies: No results found.  Scheduled Meds:  feeding supplement  237 mL Oral BID BM   LORazepam   1 mg Oral TID   Continuous Infusions:   LOS: 262 days   [name],  MD [facility] P8/01/2024, 11:03 AM

## 2023-11-30 NOTE — Plan of Care (Signed)
  Problem: Clinical Measurements: Goal: Will remain free from infection Outcome: Progressing   Problem: Nutrition: Goal: Adequate nutrition will be maintained Outcome: Progressing   Problem: Elimination: Goal: Will not experience complications related to bowel motility Outcome: Progressing   Problem: Safety: Goal: Ability to remain free from injury will improve Outcome: Progressing   Problem: Coping: Goal: Level of anxiety will decrease Outcome: Not Progressing

## 2023-11-30 NOTE — Plan of Care (Signed)

## 2023-12-01 NOTE — Progress Notes (Signed)
 PROGRESS NOTE  Natalie Brown  FMW:[id number] DOB: 30-May-1965 DOA: 01/22/2023 PCP: [name], NP   Brief Narrative: Patient is a 58 year old female with PMH of bipolar disorder, early onset dementia who was initially admitted for failure to thrive .  Patient has had a long and complicated hospitalization spanning 2 years.  She was admitted in 2023 with increasingly erratic behavior and cognitive impairment.  She was evaluated by psychiatry and neurology but no treatable or reversible cause of her impairment was found.  Her hospital course was complicated by weight loss, malnutrition, failure to thrive .  She was briefly placed in a memory care unit in October 2024.  Upon arriving at the facility, patient exhibited agitation so patient was denied entry and sent back to the hospital the same day.  Patient has remained in the hospital since then.  Patient is nonverbal and does not follow any commands.  Patient can ambulate with assistance.  Currently, the goal of care is comfort care.  TOC team is following patient, and assisting with possible disposition.    Assessment & Plan: Early onset dementia with behavioral disturbances/bipolar disorder : Clinical scenario as above. Patient is mostly nonverbal and does not follow any commands.  She ambulates with assistance. Mostly kept in safety cage through out the day since she is not redirectable. -Continue Ativan  1 mg ,3 times daily -Valium  and trazodone  as needed  Full comfort care - Symptomatic management as above.  CKD-3: Stable  Severe protein calorie malnutrition: Dietitian was following.  Needs assistance with meals. Nutrition Problem: Severe Malnutrition Etiology: chronic illness    DVT prophylaxis:     Code Status: Limited: Do not attempt resuscitation (DNR) -DNR-LIMITED -Do Not Intubate/DNI   Family Communication: None at bedside  Patient status:Inpatient  Patient is from :Home  Anticipated discharge to:not sure  Estimated  DC date:not sure   Consultants: Psychiatry, neurology and palliative care  Procedures: None yet  Antimicrobials:  Anti-infectives (From admission, onward)    None       Subjective: No major events overnight of this morning.  Sleeping comfortably.  Objective: Vitals:   11/28/23 0532 11/30/23 1148 11/30/23 2127 12/01/23 0744  BP: 92/71 (!) 98/58 126/89 113/70  Pulse: 68 78 85 63  Resp: 18 15 15    Temp:  98.1 F (36.7 C)    TempSrc:      SpO2: 100% 100% 98% 97%  Weight:      Height:        Intake/Output Summary (Last 24 hours) at 12/01/2023 1041 Last data filed at 12/01/2023 0903 Gross per 24 hour  Intake 657 ml  Output --  Net 657 ml     Filed Weights   11/12/23 0455 11/13/23 0500 11/14/23 0719  Weight: 40 kg 40 kg 40 kg    Examination:  General exam: Sleeping comfortably.  Nonverbal.  No distress.  Data Reviewed: I have personally reviewed following labs and imaging studies  CBC: No results for input(s): WBC, NEUTROABS, HGB, HCT, MCV, PLT in the last 168 hours. Basic Metabolic Panel: No results for input(s): NA, K, CL, CO2, GLUCOSE, BUN, CREATININE, CALCIUM , MG, PHOS in the last 168 hours.   No results found for this or any previous visit (from the past 240 hours).   Radiology Studies: No results found.  Scheduled Meds:  feeding supplement  237 mL Oral BID BM   LORazepam   1 mg Oral TID   Continuous Infusions:   LOS: 263 days   [name], MD  [facility] P8/02/2024, 10:41 AM

## 2023-12-01 NOTE — Plan of Care (Signed)

## 2023-12-02 NOTE — Plan of Care (Signed)
  Problem: Education: Goal: Knowledge of General Education information will improve Description: Including pain rating scale, medication(s)/side effects and non-pharmacologic comfort measures Outcome: Progressing   Problem: Health Behavior/Discharge Planning: Goal: Ability to manage health-related needs will improve Outcome: Progressing   Problem: Clinical Measurements: Goal: Ability to maintain clinical measurements within normal limits will improve Outcome: Progressing Goal: Will remain free from infection Outcome: Progressing Goal: Diagnostic test results will improve Outcome: Progressing   Problem: Activity: Goal: Risk for activity intolerance will decrease Outcome: Progressing   Problem: Nutrition: Goal: Adequate nutrition will be maintained Outcome: Progressing   Problem: Coping: Goal: Level of anxiety will decrease Outcome: Progressing   Problem: Elimination: Goal: Will not experience complications related to bowel motility Outcome: Progressing Goal: Will not experience complications related to urinary retention Outcome: Progressing   Problem: Pain Managment: Goal: General experience of comfort will improve and/or be controlled Outcome: Progressing   Problem: Safety: Goal: Ability to remain free from injury will improve Outcome: Progressing   Problem: Skin Integrity: Goal: Risk for impaired skin integrity will decrease Outcome: Progressing   Problem: Safety: Goal: Violent Restraint(s) Outcome: Progressing

## 2023-12-02 NOTE — Progress Notes (Signed)
 PROGRESS NOTE  PALIN TRISTAN  FMW:[id number] DOB: 26-May-1965 DOA: 01/22/2023 PCP: [name], NP   Brief Narrative: Patient is a 58 year old female with PMH of bipolar disorder, early onset dementia who was initially admitted for failure to thrive .  Patient has had a long and complicated hospitalization spanning 2 years.  She was admitted in 2023 with increasingly erratic behavior and cognitive impairment.  She was evaluated by psychiatry and neurology but no treatable or reversible cause of her impairment was found.  Her hospital course was complicated by weight loss, malnutrition, failure to thrive .  She was briefly placed in a memory care unit in October 2024.  Upon arriving at the facility, patient exhibited agitation so patient was denied entry and sent back to the hospital the same day.  Patient has remained in the hospital since then.  Patient is nonverbal and does not follow any commands.  Patient can ambulate with assistance.  Currently, the goal of care is comfort care.  TOC team is following patient, and assisting with possible disposition.    Assessment & Plan: Early onset dementia with behavioral disturbances/bipolar disorder : Clinical scenario as above. Patient is mostly nonverbal and does not follow any commands.  She ambulates with assistance. Mostly kept in safety cage through out the day since she is not redirectable. -Continue Ativan  1 mg ,3 times daily -Valium  and trazodone  as needed  Full comfort care - Symptomatic management as above.  CKD-3: Stable  Severe protein calorie malnutrition: Dietitian was following.  Needs assistance with meals. Nutrition Problem: Severe Malnutrition Etiology: chronic illness    DVT prophylaxis:     Code Status: Limited: Do not attempt resuscitation (DNR) -DNR-LIMITED -Do Not Intubate/DNI   Family Communication: None at bedside  Patient status:Inpatient  Patient is from :Home  Anticipated discharge to:not sure  Estimated  DC date:not sure   Consultants: Psychiatry, neurology and palliative care  Procedures: None yet  Antimicrobials:  Anti-infectives (From admission, onward)    None       Subjective: No major events overnight of this morning.  Sleeping comfortably.  Objective: Vitals:   11/28/23 0532 11/30/23 1148 11/30/23 2127 12/01/23 0744  BP: 92/71 (!) 98/58 126/89 113/70  Pulse: 68 78 85 63  Resp: 18 15 15    Temp:  98.1 F (36.7 C)    TempSrc:      SpO2: 100% 100% 98% 97%  Weight:      Height:        Intake/Output Summary (Last 24 hours) at 12/02/2023 1128 Last data filed at 12/02/2023 0911 Gross per 24 hour  Intake 454 ml  Output --  Net 454 ml     Filed Weights   11/13/23 0500 11/14/23 0719  Weight: 40 kg 40 kg    Examination:  General exam: Sleeping comfortably.  Nonverbal.  No distress.  Data Reviewed: I have personally reviewed following labs and imaging studies  CBC: No results for input(s): WBC, NEUTROABS, HGB, HCT, MCV, PLT in the last 168 hours. Basic Metabolic Panel: No results for input(s): NA, K, CL, CO2, GLUCOSE, BUN, CREATININE, CALCIUM , MG, PHOS in the last 168 hours.   No results found for this or any previous visit (from the past 240 hours).   Radiology Studies: No results found.  Scheduled Meds:  feeding supplement  237 mL Oral BID BM   LORazepam   1 mg Oral TID   Continuous Infusions:   LOS: 264 days   [name], MD [facility] P8/03/2024, 11:28  AM

## 2023-12-02 NOTE — Plan of Care (Signed)
  Problem: Clinical Measurements: Goal: Ability to maintain clinical measurements within normal limits will improve 12/02/2023 0428 by Vinie Ginny PARAS, RN Outcome: Progressing 12/02/2023 0428 by Vinie Ginny PARAS, RN Outcome: Not Progressing Goal: Will remain free from infection 12/02/2023 0428 by Vinie Ginny PARAS, RN Outcome: Progressing 12/02/2023 0428 by Vinie Ginny PARAS, RN Outcome: Not Progressing Goal: Diagnostic test results will improve 12/02/2023 0428 by Vinie Ginny PARAS, RN Outcome: Progressing 12/02/2023 0428 by Vinie Ginny PARAS, RN Outcome: Not Progressing   Problem: Activity: Goal: Risk for activity intolerance will decrease 12/02/2023 0428 by Vinie Ginny PARAS, RN Outcome: Progressing 12/02/2023 0428 by Vinie Ginny PARAS, RN Outcome: Not Progressing   Problem: Nutrition: Goal: Adequate nutrition will be maintained 12/02/2023 0428 by Vinie Ginny PARAS, RN Outcome: Progressing 12/02/2023 0428 by Vinie Ginny PARAS, RN Outcome: Not Progressing   Problem: Elimination: Goal: Will not experience complications related to bowel motility 12/02/2023 0428 by Vinie Ginny PARAS, RN Outcome: Progressing 12/02/2023 0428 by Vinie Ginny PARAS, RN Outcome: Not Progressing Goal: Will not experience complications related to urinary retention 12/02/2023 0428 by Vinie Ginny PARAS, RN Outcome: Progressing 12/02/2023 0428 by Vinie Ginny PARAS, RN Outcome: Not Progressing   Problem: Pain Managment: Goal: General experience of comfort will improve and/or be controlled 12/02/2023 0428 by Vinie Ginny PARAS, RN Outcome: Progressing 12/02/2023 0428 by Vinie Ginny PARAS, RN Outcome: Not Progressing   Problem: Safety: Goal: Ability to remain free from injury will improve 12/02/2023 0428 by Vinie Ginny PARAS, RN Outcome: Progressing 12/02/2023 0428 by Vinie Ginny PARAS, RN Outcome: Not Progressing   Problem: Skin Integrity: Goal: Risk for impaired skin integrity will decrease 12/02/2023 0428 by Vinie Ginny PARAS, RN Outcome: Progressing 12/02/2023 0428 by Vinie Ginny PARAS, RN Outcome: Not Progressing   Problem: Safety: Goal: Violent Restraint(s) 12/02/2023 0428 by Vinie Ginny PARAS, RN Outcome: Progressing 12/02/2023 0428 by Vinie Ginny PARAS, RN Outcome: Not Progressing

## 2023-12-03 NOTE — Plan of Care (Signed)
  Problem: Education: Goal: Knowledge of General Education information will improve Description: Including pain rating scale, medication(s)/side effects and non-pharmacologic comfort measures Outcome: Not Applicable   Problem: Health Behavior/Discharge Planning: Goal: Ability to manage health-related needs will improve Outcome: Not Applicable   Problem: Clinical Measurements: Goal: Ability to maintain clinical measurements within normal limits will improve Outcome: Not Applicable Goal: Will remain free from infection Outcome: Not Applicable Goal: Diagnostic test results will improve Outcome: Not Applicable   Problem: Activity: Goal: Risk for activity intolerance will decrease Outcome: Not Applicable   Problem: Nutrition: Goal: Adequate nutrition will be maintained Outcome: Not Applicable   Problem: Coping: Goal: Level of anxiety will decrease Outcome: Not Applicable   Problem: Elimination: Goal: Will not experience complications related to bowel motility Outcome: Not Applicable Goal: Will not experience complications related to urinary retention Outcome: Not Applicable   Problem: Pain Managment: Goal: General experience of comfort will improve and/or be controlled Outcome: Not Applicable   Problem: Safety: Goal: Ability to remain free from injury will improve Outcome: Not Applicable   Problem: Skin Integrity: Goal: Risk for impaired skin integrity will decrease Outcome: Not Applicable   Problem: Safety: Goal: Violent Restraint(s) Outcome: Not Applicable

## 2023-12-03 NOTE — Progress Notes (Addendum)
 Observed pt sleeping. Attempted to wake up to get vitals, pt didn't comply. Pt removed blood pressure cuff and didn't allow this nurse to place pulse ox on finger or get oral or axillary temp. Will try again at a later time.    Pt allowed vitals to be taken, recorded in flowsheet. Pt resting securely in enclosure bed. No complaints at this time.

## 2023-12-03 NOTE — Progress Notes (Signed)
  PROGRESS NOTE  Natalie Brown  FMW:[id number] DOB: May 31, 1965 DOA: 01/22/2023 PCP: [name], NP   Brief Narrative: Patient is a 58 year old female with PMH of bipolar disorder, early onset dementia who was initially admitted for failure to thrive .  Patient has had a long and complicated hospitalization spanning 2 years.  She was admitted in 2023 with increasingly erratic behavior and cognitive impairment.  She was evaluated by psychiatry and neurology but no treatable or reversible cause of her impairment was found.  Her hospital course was complicated by weight loss, malnutrition, failure to thrive .  She was briefly placed in a memory care unit in October 2024.  Upon arriving at the facility, patient exhibited agitation so patient was denied entry and sent back to the hospital the same day.  Patient has remained in the hospital since then.  Patient is nonverbal and does not follow any commands.  Patient can ambulate with assistance.  Currently, the goal of care is comfort care.  TOC team is following patient, and assisting with possible disposition.    Assessment & Plan: Early onset dementia with behavioral disturbances/bipolar disorder : Clinical scenario as above. Patient is mostly nonverbal and does not follow any commands.  She ambulates with assistance. Mostly kept in safety cage through out the day since she is not redirectable. -Continue Ativan  1 mg ,3 times daily -Valium  and trazodone  as needed  Full comfort care - Symptomatic management as above.  CKD-3: Stable  Severe protein calorie malnutrition: Dietitian was following.  Needs assistance with meals. Nutrition Problem: Severe Malnutrition Etiology: chronic illness    DVT prophylaxis:     Code Status: Limited: Do not attempt resuscitation (DNR) -DNR-LIMITED -Do Not Intubate/DNI   Family Communication: None at bedside  Patient status:Inpatient  Patient is from :Home  Anticipated discharge to:not sure  Estimated  DC date:not sure   Consultants: Psychiatry, neurology and palliative care  Procedures: None yet  Antimicrobials:  Anti-infectives (From admission, onward)    None       Subjective: No major events overnight of this morning.  Sleeping comfortably.  Objective: Vitals:   11/30/23 2127 12/01/23 0744 12/02/23 1252 12/03/23 0958  BP: 126/89 113/70 (!) 109/59 (!) 89/60  Pulse: 85 63 (!) 56 98  Resp: 15  15 18   Temp:   (!) 96.3 F (35.7 C) 98.9 F (37.2 C)  TempSrc:   Axillary Axillary  SpO2: 98% 97% 98% 99%  Weight:      Height:       No intake or output data in the 24 hours ending 12/03/23 1357    Filed Weights   11/14/23 0719  Weight: 40 kg    Examination:  General exam: Sleeping comfortably.  Nonverbal.  No distress.  Data Reviewed: I have personally reviewed following labs and imaging studies  CBC: No results for input(s): WBC, NEUTROABS, HGB, HCT, MCV, PLT in the last 168 hours. Basic Metabolic Panel: No results for input(s): NA, K, CL, CO2, GLUCOSE, BUN, CREATININE, CALCIUM , MG, PHOS in the last 168 hours.   No results found for this or any previous visit (from the past 240 hours).   Radiology Studies: No results found.  Scheduled Meds:  feeding supplement  237 mL Oral BID BM   LORazepam   1 mg Oral TID   Continuous Infusions:   LOS: 265 days   [name], MD [facility] P8/13/2025, 1:57 PM

## 2023-12-04 NOTE — Progress Notes (Signed)
 PROGRESS NOTE  Natalie Brown  FMW:[id number] DOB: 14-May-1965 DOA: 01/22/2023 PCP: [name], NP   Brief Narrative: Patient is a 58 year old female with PMH of bipolar disorder, early onset dementia who was initially admitted for failure to thrive .  Patient has had a long and complicated hospitalization spanning 2 years.  She was admitted in 2023 with increasingly erratic behavior and cognitive impairment.  She was evaluated by psychiatry and neurology but no treatable or reversible cause of her impairment was found.  Her hospital course was complicated by weight loss, malnutrition, failure to thrive .  She was briefly placed in a memory care unit in October 2024.  Upon arriving at the facility, patient exhibited agitation so patient was denied entry and sent back to the hospital the same day.  Patient has remained in the hospital since then.  Patient is nonverbal and does not follow any commands.  Patient can ambulate with assistance.  Currently, the goal of care is comfort care.  TOC team is following patient, and assisting with possible disposition.    Assessment & Plan: Early onset dementia with behavioral disturbances/bipolar disorder : Clinical scenario as above. Patient is mostly nonverbal and does not follow any commands.  She ambulates with assistance. Mostly kept in safety cage through out the day since she is not redirectable. -Continue Ativan  1 mg ,3 times daily -Valium  and trazodone  as needed  Full comfort care - Symptomatic management as above.  CKD-3: Stable  Severe protein calorie malnutrition: Dietitian was following.  Needs assistance with meals. Nutrition Problem: Severe Malnutrition Etiology: chronic illness    DVT prophylaxis:     Code Status: Limited: Do not attempt resuscitation (DNR) -DNR-LIMITED -Do Not Intubate/DNI   Family Communication: None at bedside  Patient status:Inpatient  Patient is from :Home  Anticipated discharge to:not sure  Estimated  DC date:not sure   Consultants: Psychiatry, neurology and palliative care  Procedures: None yet  Antimicrobials:  Anti-infectives (From admission, onward)    None       Subjective: No major events overnight of this morning.  Sleeping comfortably.  Objective: Vitals:   12/01/23 0744 12/02/23 1252 12/03/23 0958 12/03/23 1437  BP: 113/70 (!) 109/59 (!) 89/60 94/60  Pulse: 63 (!) 56 98 86  Resp:  15 18 18   Temp:  (!) 96.3 F (35.7 C) 98.9 F (37.2 C) 98 F (36.7 C)  TempSrc:  Axillary Axillary Axillary  SpO2: 97% 98% 99% 100%  Weight:      Height:        Intake/Output Summary (Last 24 hours) at 12/04/2023 1303 Last data filed at 12/04/2023 0923 Gross per 24 hour  Intake 358 ml  Output --  Net 358 ml      Filed Weights   11/14/23 0719  Weight: 40 kg    Examination:  General exam: Sleeping comfortably.  Nonverbal.  No distress.  Data Reviewed: I have personally reviewed following labs and imaging studies  CBC: No results for input(s): WBC, NEUTROABS, HGB, HCT, MCV, PLT in the last 168 hours. Basic Metabolic Panel: No results for input(s): NA, K, CL, CO2, GLUCOSE, BUN, CREATININE, CALCIUM , MG, PHOS in the last 168 hours.   No results found for this or any previous visit (from the past 240 hours).   Radiology Studies: No results found.  Scheduled Meds:  feeding supplement  237 mL Oral BID BM   LORazepam   1 mg Oral TID   Continuous Infusions:   LOS: 266 days   Faelyn Sigler T  Kathrin, MD [facility] P8/14/2025, 1:03 PM

## 2023-12-05 NOTE — Progress Notes (Signed)
 PROGRESS NOTE  Natalie Brown  FMW:[id number] DOB: [birth year] DOA: 01/22/2023 PCP: [name], NP   Brief Narrative: Patient is a 58 year old female with PMH of bipolar disorder, early onset dementia who was initially admitted for failure to thrive .  Patient has had a long and complicated hospitalization spanning 2 years.  She was admitted in 2023 with increasingly erratic behavior and cognitive impairment.  She was evaluated by psychiatry and neurology but no treatable or reversible cause of her impairment was found.  Her hospital course was complicated by weight loss, malnutrition, failure to thrive .  She was briefly placed in a memory care unit in October 2024.  Upon arriving at the facility, patient exhibited agitation so patient was denied entry and sent back to the hospital the same day.  Patient has remained in the hospital since then.  Patient is nonverbal and does not follow any commands.  Patient can ambulate with assistance.  Currently, the goal of care is comfort care.  TOC team is following patient, and assisting with possible disposition.    Assessment & Plan: Early onset dementia with behavioral disturbances/bipolar disorder : Clinical scenario as above. Patient is mostly nonverbal and does not follow any commands.  She ambulates with assistance. Mostly kept in safety cage through out the day since she is not redirectable. -Continue Ativan  1 mg ,3 times daily -Valium  and trazodone  as needed  Full comfort care - Symptomatic management as above.  CKD-3: Stable  Severe protein calorie malnutrition: Dietitian was following.  Needs assistance with meals. Nutrition Problem: Severe Malnutrition Etiology: chronic illness    DVT prophylaxis:     Code Status: Limited: Do not attempt resuscitation (DNR) -DNR-LIMITED -Do Not Intubate/DNI   Family Communication: None at bedside  Patient status:Inpatient  Patient is from :Home  Anticipated discharge to:not sure  Estimated  DC date:not sure   Consultants: Psychiatry, neurology and palliative care  Procedures: None yet  Antimicrobials:  Anti-infectives (From admission, onward)    None       Subjective: No major events overnight of this morning.  Sleeping comfortably.  Objective: Vitals:   12/02/23 1252 12/03/23 0958 12/03/23 1437 12/04/23 1628  BP: (!) 109/59 (!) 89/60 94/60 105/66  Pulse: (!) 56 98 86 73  Resp: 15 18 18 17   Temp: (!) 96.3 F (35.7 C) 98.9 F (37.2 C) 98 F (36.7 C) (!) 97 F (36.1 C)  TempSrc: Axillary Axillary Axillary   SpO2: 98% 99% 100% 100%  Weight:      Height:        Intake/Output Summary (Last 24 hours) at 12/05/2023 1028 Last data filed at 12/05/2023 0951 Gross per 24 hour  Intake 714 ml  Output --  Net 714 ml      Filed Weights   11/14/23 0719  Weight: 40 kg    Examination:  General exam: Sleeping comfortably.  Nonverbal.  No distress.  Data Reviewed: I have personally reviewed following labs and imaging studies  CBC: No results for input(s): WBC, NEUTROABS, HGB, HCT, MCV, PLT in the last 168 hours. Basic Metabolic Panel: No results for input(s): NA, K, CL, CO2, GLUCOSE, BUN, CREATININE, CALCIUM , MG, PHOS in the last 168 hours.   No results found for this or any previous visit (from the past 240 hours).   Radiology Studies: No results found.  Scheduled Meds:  feeding supplement  237 mL Oral BID BM   LORazepam   1 mg Oral TID   Continuous Infusions:   LOS: 267 days  [name], MD [facility] P8/15/2025, 10:28 AM

## 2023-12-05 NOTE — TOC Progression Note (Addendum)
 Transition of Care [facility]) - Progression Note    Patient Details  Name: Natalie Brown MRN: [id number] Date of Birth: [birth year]  [facility]) CM/SW Contact  Jackqulyn Mendel A [place], LCSW Phone Number: 12/05/2023, 10:05 AM  Clinical Narrative:     Barriers to discharge remain the same. No placement options. CSW continuing to follow pt.   Expected Discharge Plan: Skilled Nursing Facility Barriers to Discharge: SNF Pending bed offer, Insurance Authorization               Expected Discharge Plan and Services In-house Referral: Clinical Social Work   Post Acute Care Choice: Skilled Nursing Facility Living arrangements for the past 2 months: Assisted Living Facility                                       Social Drivers of Health (SDOH) Interventions SDOH Screenings   Food Insecurity: No Food Insecurity (05/14/2023)  Housing: Low Risk  (01/24/2023)  Transportation Needs: No Transportation Needs (05/14/2023)  Utilities: Not At Risk (05/14/2023)  Alcohol Screen: Low Risk  (09/06/2021)  Depression (PHQ2-9): Medium Risk (05/26/2020)  Tobacco Use: High Risk (04/04/2023)    Readmission Risk Interventions     No data to display

## 2023-12-05 NOTE — Progress Notes (Signed)
 Assumed care at 0700, patient lying in bed with eyes closed, skin warm and dry to the touch, even-unlabored respirations, no acute diustress noted, call bell within reach and safety measures are in place.

## 2023-12-06 NOTE — Progress Notes (Signed)
 PROGRESS NOTE  Natalie Brown  FMW:[id number] DOB: [birth year] DOA: 01/22/2023 PCP: [name], NP   Brief Narrative: Patient is a 58 year old female with PMH of bipolar disorder, early onset dementia who was initially admitted for failure to thrive .  Patient has had a long and complicated hospitalization spanning 2 years.  She was admitted in 2023 with increasingly erratic behavior and cognitive impairment.  She was evaluated by psychiatry and neurology but no treatable or reversible cause of her impairment was found.  Her hospital course was complicated by weight loss, malnutrition, failure to thrive .  She was briefly placed in a memory care unit in October 2024.  Upon arriving at the facility, patient exhibited agitation so patient was denied entry and sent back to the hospital the same day.  Patient has remained in the hospital since then.  Patient is nonverbal and does not follow any commands.  Patient can ambulate with assistance.  Currently, the goal of care is comfort care.  TOC team is following patient, and assisting with possible disposition.    Assessment & Plan: Early onset dementia with behavioral disturbances/bipolar disorder : Clinical scenario as above. Patient is mostly nonverbal and does not follow any commands.  She ambulates with assistance. Mostly kept in safety cage through out the day since she is not redirectable. -Continue Ativan  1 mg ,3 times daily -Valium  and trazodone  as needed  Full comfort care - Symptomatic management as above.  CKD-3: Stable  Severe protein calorie malnutrition: Dietitian was following.  Needs assistance with meals. Nutrition Problem: Severe Malnutrition Etiology: chronic illness    DVT prophylaxis:     Code Status: Limited: Do not attempt resuscitation (DNR) -DNR-LIMITED -Do Not Intubate/DNI   Family Communication: None at bedside  Patient status:Inpatient  Patient is from :Home  Anticipated discharge to:not sure  Estimated  DC date:not sure   Consultants: Psychiatry, neurology and palliative care  Procedures: None yet  Antimicrobials:  Anti-infectives (From admission, onward)    None       Subjective: No major events overnight of this morning.  Sleeping comfortably.  Objective: Vitals:   12/03/23 0958 12/03/23 1437 12/04/23 1628 12/06/23 0751  BP: (!) 89/60 94/60 105/66 106/60  Pulse: 98 86 73 67  Resp: 18 18 17    Temp: 98.9 F (37.2 C) 98 F (36.7 C) (!) 97 F (36.1 C) 98 F (36.7 C)  TempSrc: Axillary Axillary    SpO2: 99% 100% 100%   Weight:      Height:        Intake/Output Summary (Last 24 hours) at 12/06/2023 1130 Last data filed at 12/06/2023 0530 Gross per 24 hour  Intake 480 ml  Output --  Net 480 ml      Filed Weights   11/14/23 0719  Weight: 40 kg    Examination:  General exam: Sleeping comfortably.  Nonverbal.  No distress.  Data Reviewed: I have personally reviewed following labs and imaging studies  CBC: No results for input(s): WBC, NEUTROABS, HGB, HCT, MCV, PLT in the last 168 hours. Basic Metabolic Panel: No results for input(s): NA, K, CL, CO2, GLUCOSE, BUN, CREATININE, CALCIUM , MG, PHOS in the last 168 hours.   No results found for this or any previous visit (from the past 240 hours).   Radiology Studies: No results found.  Scheduled Meds:  feeding supplement  237 mL Oral BID BM   LORazepam   1 mg Oral TID   Continuous Infusions:   LOS: 268 days   Kerby Borner  ONEIDA Bump, MD [facility] P8/16/2025, 11:30 AM

## 2023-12-07 DIAGNOSIS — R45851 Suicidal ideations: Secondary | ICD-10-CM

## 2023-12-07 NOTE — Plan of Care (Addendum)
 The patient remains on MC-2W as of time of writing. The patient is non-verbal, unredirectable, unable to follow commands. Comfort measures only. An enclosure bed remains in use for the patient.   Problem: Education: Goal: Knowledge of the prescribed therapeutic regimen will improve Outcome: Progressing   Problem: Coping: Goal: Ability to identify and develop effective coping behavior will improve Outcome: Progressing   Problem: Clinical Measurements: Goal: Quality of life will improve Outcome: Progressing   Problem: Respiratory: Goal: Verbalizations of increased ease of respirations will increase Outcome: Progressing   Problem: Role Relationship: Goal: Family's ability to cope with current situation will improve Outcome: Progressing Goal: Ability to verbalize concerns, feelings, and thoughts to partner or family member will improve Outcome: Progressing   Problem: Pain Management: Goal: Satisfaction with pain management regimen will improve Outcome: Progressing

## 2023-12-07 NOTE — Progress Notes (Signed)
  Progress Note   Patient: Natalie Brown FMW:[id number] DOB: 03/10/1966 DOA: 01/22/2023     269 DOS: the patient was seen and examined on 12/07/2023   Brief hospital course: 58 year old female with PMH of bipolar disorder, early onset dementia who was initially admitted for failure to thrive . Patient has had a long and complicated hospitalization spanning 2 years. She was admitted in 2023 with increasingly erratic behavior and cognitive impairment. She was evaluated by psychiatry and neurology but no treatable or reversible cause of her impairment was found. Her hospital course was complicated by weight loss, malnutrition, failure to thrive . She was briefly placed in a memory care unit in October 2024. Upon arriving at the facility, patient exhibited agitation so patient was denied entry and sent back to the hospital the same day. Patient has remained in the hospital since then. Patient is nonverbal and does not follow any commands. Patient can ambulate with assistance. Currently, the goal of care is comfort care. TOC team is following patient, and assisting with possible disposition   Assessment and Plan: * Early onset Dementia with behavioral disturbance (HCC) Currently on Ativan  3 times a day and as needed Valium .  Patient is mostly nonverbal and does not follow commands.  Patient in a safety enclosure bed.  Comfort measures only status Symptomatic management.    CKD stage 3a, GFR 45-59 ml/min (HCC) No further lab draws since 5/15 when creatinine was 1.02 with a GFR greater than 60  Protein-calorie malnutrition, severe Continue Ensure  Essential hypertension Not currently on medication since comfort care measures  Bipolar disorder (HCC) On Ativan  3 times a day.        Subjective: Patient unable to provide any history.  Admitted 318 days ago with agitation.  Physical Exam: Vitals:   12/03/23 0958 12/03/23 1437 12/04/23 1628 12/06/23 0751  BP: (!) 89/60 94/60 105/66 106/60   Pulse: 98 86 73 67  Resp: 18 18 17    Temp: 98.9 F (37.2 C) 98 F (36.7 C) (!) 97 F (36.1 C) 98 F (36.7 C)  TempSrc: Axillary Axillary    SpO2: 99% 100% 100%   Height:       Physical Exam HENT:     Head: Normocephalic.  Eyes:     General: Lids are normal.  Cardiovascular:     Rate and Rhythm: Normal rate and regular rhythm.     Heart sounds: Normal heart sounds, S1 normal and S2 normal.  Pulmonary:     Breath sounds: No decreased breath sounds, wheezing, rhonchi or rales.  Abdominal:     Comments: Difficult to examine with her changing positions.  Musculoskeletal:     Right lower leg: No swelling.     Left lower leg: No swelling.  Skin:    General: Skin is warm.  Neurological:     Mental Status: [name] alert.     Comments: Patient able to move all 4 extremities on the bed on her own but not to command.     Data Reviewed: No data since June  Disposition: Status is: Inpatient Remains inpatient appropriate because: We do not have a safe disposition  Planned Discharge Destination: Yet to be determined    Time spent: 25 minutes  Author: Charlie Patterson, MD 12/07/2023 12:28 PM  For on call review [email or web address].

## 2023-12-07 NOTE — Plan of Care (Signed)
 Patient unable to comprehend any instructions or teaching to be performed due to cognitive level.

## 2023-12-07 NOTE — Assessment & Plan Note (Signed)
 On Ativan  3 times a day.

## 2023-12-08 MED ORDER — ORAL CARE MOUTH RINSE: 15.0000 mL | OROMUCOSAL | Status: DC | PRN

## 2023-12-08 NOTE — Progress Notes (Signed)
  PROGRESS NOTE  Natalie Brown  FMW:[id number] DOB: Mar 13, 1966 DOA: 01/22/2023 PCP: [name], NP   Brief Narrative: Patient is a 58 year old female with PMH of bipolar disorder, early onset dementia who was initially admitted for failure to thrive .  Patient has had a long and complicated hospitalization spanning 2 years.  She was admitted in 2023 with increasingly erratic behavior and cognitive impairment.  She was evaluated by psychiatry and neurology but no treatable or reversible cause of her impairment was found.  Her hospital course was complicated by weight loss, malnutrition, failure to thrive .  She was briefly placed in a memory care unit in October 2024.  Upon arriving at the facility, patient exhibited agitation so patient was denied entry and sent back to the hospital the same day.  Patient has remained in the hospital since then.  Patient is nonverbal and does not follow any commands.  Patient can ambulate with assistance.  Currently, the goal of care is comfort care.  TOC team is following patient, and assisting with possible disposition.    Assessment & Plan: Early onset dementia with behavioral disturbances/bipolar disorder : Clinical scenario as above. Patient is mostly nonverbal and does not follow any commands.  She ambulates with assistance. Mostly kept in safety cage through out the day since she is not redirectable. -Continue Ativan  1 mg ,3 times daily -Valium  and trazodone  as needed  Full comfort care - Symptomatic management as above.  CKD-3: Stable  Severe protein calorie malnutrition: Dietitian was following.  Needs assistance with meals. Nutrition Problem: Severe Malnutrition Etiology: chronic illness    DVT prophylaxis:     Code Status: Limited: Do not attempt resuscitation (DNR) -DNR-LIMITED -Do Not Intubate/DNI   Family Communication: None at bedside  Patient status:Inpatient  Patient is from :Home  Anticipated discharge to:not sure  Estimated  DC date:not sure   Consultants: Psychiatry, neurology and palliative care  Procedures: None yet  Antimicrobials:  Anti-infectives (From admission, onward)    None       Subjective: No major events overnight of this morning.  Sleeping comfortably.  Objective: Vitals:   12/04/23 1628 12/06/23 0751 12/07/23 1741 12/08/23 0630  BP: 105/66 106/60 105/66   Pulse: 73 67 88   Resp: 17  17   Temp: (!) 97 F (36.1 C) 98 F (36.7 C) 98 F (36.7 C) 98.2 F (36.8 C)  TempSrc:   Oral Axillary  SpO2: 100%     Height:        Intake/Output Summary (Last 24 hours) at 12/08/2023 1409 Last data filed at 12/08/2023 1326 Gross per 24 hour  Intake 360 ml  Output --  Net 360 ml      Filed Weights    Examination:  General exam: Sleeping comfortably.  Nonverbal.  No distress.  Data Reviewed: I have personally reviewed following labs and imaging studies  CBC: No results for input(s): WBC, NEUTROABS, HGB, HCT, MCV, PLT in the last 168 hours. Basic Metabolic Panel: No results for input(s): NA, K, CL, CO2, GLUCOSE, BUN, CREATININE, CALCIUM , MG, PHOS in the last 168 hours.   No results found for this or any previous visit (from the past 240 hours).   Radiology Studies: No results found.  Scheduled Meds:  feeding supplement  237 mL Oral BID BM   LORazepam   1 mg Oral TID   Continuous Infusions:   LOS: 270 days   [name], MD [facility] P8/18/2025, 2:09 PM

## 2023-12-09 NOTE — Progress Notes (Signed)
  PROGRESS NOTE  Natalie Brown  FMW:[id number] DOB: [birth year] DOA: 01/22/2023 PCP: [name], NP   Brief Narrative: Patient is a 58 year old female with PMH of bipolar disorder, early onset dementia who was initially admitted for failure to thrive .  Patient has had a long and complicated hospitalization spanning 2 years.  She was admitted in 2023 with increasingly erratic behavior and cognitive impairment.  She was evaluated by psychiatry and neurology but no treatable or reversible cause of her impairment was found.  Her hospital course was complicated by weight loss, malnutrition, failure to thrive .  She was briefly placed in a memory care unit in October 2024.  Upon arriving at the facility, patient exhibited agitation so patient was denied entry and sent back to the hospital the same day.  Patient has remained in the hospital since then.  Patient is nonverbal and does not follow any commands.  Patient can ambulate with assistance.  Currently, the goal of care is comfort care.  TOC team is following patient, and assisting with possible disposition.    Assessment & Plan: Early onset dementia with behavioral disturbances/bipolar disorder : Clinical scenario as above. Patient is mostly nonverbal and does not follow any commands.  She ambulates with assistance. Mostly kept in safety cage through out the day since she is not redirectable. -Continue Ativan  1 mg ,3 times daily -Valium  and trazodone  as needed  Full comfort care - Symptomatic management as above.  CKD-3: Stable  Severe protein calorie malnutrition: Dietitian was following.  Needs assistance with meals. Nutrition Problem: Severe Malnutrition Etiology: chronic illness    DVT prophylaxis:     Code Status: Limited: Do not attempt resuscitation (DNR) -DNR-LIMITED -Do Not Intubate/DNI   Family Communication: None at bedside  Patient status:Inpatient  Patient is from :Home  Anticipated discharge to:not sure  Estimated  DC date:not sure   Consultants: Psychiatry, neurology and palliative care  Procedures: None yet  Antimicrobials:  Anti-infectives (From admission, onward)    None       Subjective: No major events overnight of this morning.  Sleeping comfortably.  Objective: Vitals:   12/07/23 1741 12/08/23 0630 12/08/23 2030 12/09/23 1130  BP: 105/66  100/68 96/64  Pulse: 88  72 77  Resp: 17  17 16   Temp: 98 F (36.7 C) 98.2 F (36.8 C) 98.4 F (36.9 C) 97.7 F (36.5 C)  TempSrc: Oral Axillary Axillary   SpO2:    100%  Height:        Intake/Output Summary (Last 24 hours) at 12/09/2023 1204 Last data filed at 12/09/2023 0920 Gross per 24 hour  Intake 960 ml  Output --  Net 960 ml      Filed Weights    Examination:  General exam: Sleeping comfortably.  Nonverbal.  No distress.  Data Reviewed: I have personally reviewed following labs and imaging studies  CBC: No results for input(s): WBC, NEUTROABS, HGB, HCT, MCV, PLT in the last 168 hours. Basic Metabolic Panel: No results for input(s): NA, K, CL, CO2, GLUCOSE, BUN, CREATININE, CALCIUM , MG, PHOS in the last 168 hours.   No results found for this or any previous visit (from the past 240 hours).   Radiology Studies: No results found.  Scheduled Meds:  feeding supplement  237 mL Oral BID BM   LORazepam   1 mg Oral TID   Continuous Infusions:   LOS: 271 days   [name], MD [facility] P8/19/2025, 12:04 PM

## 2023-12-09 NOTE — Plan of Care (Signed)
  Problem: Education: Goal: Knowledge of the prescribed therapeutic regimen will improve Outcome: Progressing   Problem: Pain Management: Goal: Satisfaction with pain management regimen will improve Outcome: Progressing   Problem: Clinical Measurements: Goal: Quality of life will improve Outcome: Progressing

## 2023-12-09 NOTE — Plan of Care (Signed)
  Problem: Education: Goal: Knowledge of the prescribed therapeutic regimen will improve Outcome: Progressing   Problem: Coping: Goal: Ability to identify and develop effective coping behavior will improve Outcome: Progressing   Problem: Clinical Measurements: Goal: Quality of life will improve Outcome: Progressing   Problem: Respiratory: Goal: Verbalizations of increased ease of respirations will increase Outcome: Progressing   Problem: Role Relationship: Goal: Family's ability to cope with current situation will improve Outcome: Progressing Goal: Ability to verbalize concerns, feelings, and thoughts to partner or family member will improve Outcome: Progressing   Problem: Pain Management: Goal: Satisfaction with pain management regimen will improve Outcome: Progressing   

## 2023-12-10 NOTE — Progress Notes (Signed)
  PROGRESS NOTE  Natalie Brown  FMW:[id number] DOB: [birth year] DOA: 01/22/2023 PCP: [name], NP   Brief Narrative: Patient is a 58 year old female with PMH of bipolar disorder, early onset dementia who was initially admitted for failure to thrive .  Patient has had a long and complicated hospitalization spanning 2 years.  She was admitted in 2023 with increasingly erratic behavior and cognitive impairment.  She was evaluated by psychiatry and neurology but no treatable or reversible cause of her impairment was found.  Her hospital course was complicated by weight loss, malnutrition, failure to thrive .  She was briefly placed in a memory care unit in October 2024.  Upon arriving at the facility, patient exhibited agitation so patient was denied entry and sent back to the hospital the same day.  Patient has remained in the hospital since then.  Patient is nonverbal and does not follow any commands.  Patient can ambulate with assistance.  Currently, the goal of care is comfort care.  TOC team is following patient, and assisting with possible disposition.    Assessment & Plan: Early onset dementia with behavioral disturbances/bipolar disorder : Clinical scenario as above. Patient is mostly nonverbal and does not follow any commands.  She ambulates with assistance. Mostly kept in safety cage through out the day since she is not redirectable. -Continue Ativan  1 mg ,3 times daily -Valium  and trazodone  as needed  Full comfort care - Symptomatic management as above.  CKD-3: Stable  Severe protein calorie malnutrition: Dietitian was following.  Needs assistance with meals. Nutrition Problem: Severe Malnutrition Etiology: chronic illness    DVT prophylaxis:     Code Status: Limited: Do not attempt resuscitation (DNR) -DNR-LIMITED -Do Not Intubate/DNI   Family Communication: None at bedside  Patient status:Inpatient  Patient is from :Home  Anticipated discharge to:not sure  Estimated  DC date:not sure   Consultants: Psychiatry, neurology and palliative care  Procedures: None yet  Antimicrobials:  Anti-infectives (From admission, onward)    None       Subjective: No major events overnight of this morning.  Sleeping comfortably.  Objective: Vitals:   12/08/23 0630 12/08/23 2030 12/09/23 1130 12/10/23 0110  BP:  100/68 96/64 101/63  Pulse:  72 77 81  Resp:  17 16 16   Temp: 98.2 F (36.8 C) 98.4 F (36.9 C) 97.7 F (36.5 C)   TempSrc: Axillary Axillary Axillary   SpO2:   100% 98%  Height:        Intake/Output Summary (Last 24 hours) at 12/10/2023 1030 Last data filed at 12/09/2023 1754 Gross per 24 hour  Intake 240 ml  Output --  Net 240 ml      Filed Weights    Examination:  General exam: Sleeping comfortably.  Nonverbal.  No distress.  Data Reviewed: I have personally reviewed following labs and imaging studies  CBC: No results for input(s): WBC, NEUTROABS, HGB, HCT, MCV, PLT in the last 168 hours. Basic Metabolic Panel: No results for input(s): NA, K, CL, CO2, GLUCOSE, BUN, CREATININE, CALCIUM , MG, PHOS in the last 168 hours.   No results found for this or any previous visit (from the past 240 hours).   Radiology Studies: No results found.  Scheduled Meds:  feeding supplement  237 mL Oral BID BM   LORazepam   1 mg Oral TID   Continuous Infusions:   LOS: 272 days   Natalie ONEIDA Bump, MD [facility] P8/20/2025, 10:30 AM

## 2023-12-11 NOTE — Progress Notes (Signed)
 PROGRESS NOTE  Natalie Brown  FMW:[id number] DOB: 12-05-1965 DOA: 01/22/2023 PCP: [name], NP   Brief Narrative: Patient is a 58 year old female with PMH of bipolar disorder, early onset dementia who was initially admitted for failure to thrive .  Patient has had a long and complicated hospitalization spanning 2 years.  She was admitted in 2023 with increasingly erratic behavior and cognitive impairment.  She was evaluated by psychiatry and neurology but no treatable or reversible cause of her impairment was found.  Her hospital course was complicated by weight loss, malnutrition, failure to thrive .  She was briefly placed in a memory care unit in October 2024.  Upon arriving at the facility, patient exhibited agitation so patient was denied entry and sent back to the hospital the same day.  Patient has remained in the hospital since then.  Patient is nonverbal and does not follow any commands.  Patient can ambulate with assistance.  Currently, the goal of care is comfort care.  TOC team is following patient, and assisting with possible disposition.    Assessment & Plan: Early onset dementia with behavioral disturbances/bipolar disorder : Clinical scenario as above. Patient is mostly nonverbal and does not follow any commands.  She ambulates with assistance. Mostly kept in safety cage through out the day since she is not redirectable. -Continue Ativan  1 mg ,3 times daily -Valium  and trazodone  as needed  Full comfort care - Symptomatic management as above.  CKD-3: Stable  Severe protein calorie malnutrition: Dietitian was following.  Needs assistance with meals. Nutrition Problem: Severe Malnutrition Etiology: chronic illness    DVT prophylaxis:     Code Status: Limited: Do not attempt resuscitation (DNR) -DNR-LIMITED -Do Not Intubate/DNI   Family Communication: None at bedside  Patient status:Inpatient  Patient is from :Home  Anticipated discharge to:not sure  Estimated  DC date:not sure   Consultants: Psychiatry, neurology and palliative care  Procedures: None yet  Antimicrobials:  Anti-infectives (From admission, onward)    None       Subjective: No major events overnight of this morning.  Sleeping in enclosure bed.  No distress.  Objective: Vitals:   12/08/23 0630 12/08/23 2030 12/09/23 1130 12/10/23 0110  BP:  100/68 96/64 101/63  Pulse:  72 77 81  Resp:  17 16 16   Temp: 98.2 F (36.8 C) 98.4 F (36.9 C) 97.7 F (36.5 C)   TempSrc: Axillary Axillary Axillary   SpO2:   100% 98%  Height:        Intake/Output Summary (Last 24 hours) at 12/11/2023 1247 Last data filed at 12/11/2023 0930 Gross per 24 hour  Intake 480 ml  Output --  Net 480 ml      Filed Weights    Examination:  General exam: Sleeping in enclosure..  Nonverbal.  No distress.  Does not follow commands.  Data Reviewed: I have personally reviewed following labs and imaging studies  CBC: No results for input(s): WBC, NEUTROABS, HGB, HCT, MCV, PLT in the last 168 hours. Basic Metabolic Panel: No results for input(s): NA, K, CL, CO2, GLUCOSE, BUN, CREATININE, CALCIUM , MG, PHOS in the last 168 hours.   No results found for this or any previous visit (from the past 240 hours).   Radiology Studies: No results found.  Scheduled Meds:  feeding supplement  237 mL Oral BID BM   LORazepam   1 mg Oral TID   Continuous Infusions:   LOS: 273 days   [name], MD [facility] P8/21/2025, 12:47 PM

## 2023-12-11 NOTE — Plan of Care (Signed)
  Problem: Education: Goal: Knowledge of the prescribed therapeutic regimen will improve Outcome: Progressing   Problem: Coping: Goal: Ability to identify and develop effective coping behavior will improve Outcome: Progressing   Problem: Clinical Measurements: Goal: Quality of life will improve Outcome: Progressing   Problem: Respiratory: Goal: Verbalizations of increased ease of respirations will increase Outcome: Progressing   Problem: Role Relationship: Goal: Family's ability to cope with current situation will improve Outcome: Progressing Goal: Ability to verbalize concerns, feelings, and thoughts to partner or family member will improve Outcome: Progressing   Problem: Pain Management: Goal: Satisfaction with pain management regimen will improve Outcome: Progressing   

## 2023-12-12 NOTE — Progress Notes (Signed)
 PROGRESS NOTE  Natalie Brown  FMW:[id number] DOB: June 30, 1965 DOA: 01/22/2023 PCP: [name], NP   Brief Narrative: Patient is a 58 year old female with PMH of bipolar disorder, early onset dementia who was initially admitted for failure to thrive .  Patient has had a long and complicated hospitalization spanning 2 years.  She was admitted in 2023 with increasingly erratic behavior and cognitive impairment.  She was evaluated by psychiatry and neurology but no treatable or reversible cause of her impairment was found.  Her hospital course was complicated by weight loss, malnutrition, failure to thrive .  She was briefly placed in a memory care unit in October 2024.  Upon arriving at the facility, patient exhibited agitation so patient was denied entry and sent back to the hospital the same day.  Patient has remained in the hospital since then.  Patient is nonverbal and does not follow any commands.  Patient can ambulate with assistance.  Currently, the goal of care is comfort care.  TOC team is following patient, and assisting with possible disposition.    Assessment & Plan: Early onset dementia with behavioral disturbances/bipolar disorder : Clinical scenario as above. Patient is mostly nonverbal and does not follow any commands.  She ambulates with assistance. Mostly kept in safety cage through out the day since she is not redirectable. -Continue Ativan  1 mg ,3 times daily -Valium  and trazodone  as needed  Full comfort care - Symptomatic management as above.  CKD-3: Stable  Severe protein calorie malnutrition: Dietitian was following.  Needs assistance with meals. Nutrition Problem: Severe Malnutrition Etiology: chronic illness    DVT prophylaxis:     Code Status: Limited: Do not attempt resuscitation (DNR) -DNR-LIMITED -Do Not Intubate/DNI   Family Communication: None at bedside  Patient status:Inpatient  Patient is from :Home  Anticipated discharge to:not sure  Estimated  DC date:not sure   Consultants: Psychiatry, neurology and palliative care  Procedures: None yet  Antimicrobials:  Anti-infectives (From admission, onward)    None       Subjective: No major events overnight of this morning.  Sleeping in enclosure bed.  No distress.  Objective: Vitals:   12/08/23 0630 12/08/23 2030 12/09/23 1130 12/10/23 0110  BP:  100/68 96/64 101/63  Pulse:  72 77 81  Resp:  17 16 16   Temp: 98.2 F (36.8 C) 98.4 F (36.9 C) 97.7 F (36.5 C)   TempSrc: Axillary Axillary Axillary   SpO2:   100% 98%  Height:        Intake/Output Summary (Last 24 hours) at 12/12/2023 1353 Last data filed at 12/12/2023 9075 Gross per 24 hour  Intake 236 ml  Output --  Net 236 ml      Filed Weights    Examination:  General exam: Sleeping in enclosure..  Nonverbal.  No distress.  Does not follow commands.  Data Reviewed: I have personally reviewed following labs and imaging studies  CBC: No results for input(s): WBC, NEUTROABS, HGB, HCT, MCV, PLT in the last 168 hours. Basic Metabolic Panel: No results for input(s): NA, K, CL, CO2, GLUCOSE, BUN, CREATININE, CALCIUM , MG, PHOS in the last 168 hours.   No results found for this or any previous visit (from the past 240 hours).   Radiology Studies: No results found.  Scheduled Meds:  feeding supplement  237 mL Oral BID BM   LORazepam   1 mg Oral TID   Continuous Infusions:   LOS: 274 days   [name], MD [facility] P8/22/2025, 1:53 PM

## 2023-12-12 NOTE — Plan of Care (Signed)
  Problem: Education: Goal: Knowledge of the prescribed therapeutic regimen will improve Outcome: Progressing   Problem: Coping: Goal: Ability to identify and develop effective coping behavior will improve Outcome: Progressing   Problem: Clinical Measurements: Goal: Quality of life will improve Outcome: Progressing   Problem: Respiratory: Goal: Verbalizations of increased ease of respirations will increase Outcome: Progressing   Problem: Role Relationship: Goal: Family's ability to cope with current situation will improve Outcome: Progressing Goal: Ability to verbalize concerns, feelings, and thoughts to partner or family member will improve Outcome: Progressing   Problem: Pain Management: Goal: Satisfaction with pain management regimen will improve Outcome: Progressing   

## 2023-12-13 NOTE — Progress Notes (Signed)
  PROGRESS NOTE  Natalie Brown  FMW:[id number] DOB: 1965/05/23 DOA: 01/22/2023 PCP: [name], NP   Brief Narrative: Patient is a 58 year old female with PMH of bipolar disorder, early onset dementia who was initially admitted for failure to thrive .  Patient has had a long and complicated hospitalization spanning 2 years.  She was admitted in 2023 with increasingly erratic behavior and cognitive impairment.  She was evaluated by psychiatry and neurology but no treatable or reversible cause of her impairment was found.  Her hospital course was complicated by weight loss, malnutrition, failure to thrive .  She was briefly placed in a memory care unit in October 2024.  Upon arriving at the facility, patient exhibited agitation so patient was denied entry and sent back to the hospital the same day.  Patient has remained in the hospital since then.  Patient is nonverbal and does not follow any commands.  Patient can ambulate with assistance.  Currently, the goal of care is comfort care.  TOC team is following patient, and assisting with possible disposition.    Assessment & Plan: Early onset dementia with behavioral disturbances/bipolar disorder : Clinical scenario as above. Patient is mostly nonverbal and does not follow any commands.  She ambulates with assistance. Mostly kept in safety cage through out the day since she is not redirectable. -Continue Ativan  1 mg ,3 times daily -Valium  and trazodone  as needed  Full comfort care - Symptomatic management as above.  CKD-3: Stable  Severe protein calorie malnutrition: Dietitian was following.  Needs assistance with meals. Nutrition Problem: Severe Malnutrition Etiology: chronic illness    DVT prophylaxis:     Code Status: Limited: Do not attempt resuscitation (DNR) -DNR-LIMITED -Do Not Intubate/DNI   Family Communication: None at bedside  Patient status:Inpatient  Patient is from :Home  Anticipated discharge to:not sure  Estimated  DC date:not sure   Consultants: Psychiatry, neurology and palliative care  Procedures: None yet  Antimicrobials:  Anti-infectives (From admission, onward)    None       Subjective: No major events overnight of this morning.  Sleeping in enclosure bed.  No distress.  Objective: Vitals:   12/08/23 2030 12/09/23 1130 12/10/23 0110 12/13/23 0021  BP: 100/68 96/64 101/63 122/67  Pulse: 72 77 81 70  Resp: 17 16 16 17   Temp: 98.4 F (36.9 C) 97.7 F (36.5 C)    TempSrc: Axillary Axillary    SpO2:  100% 98% 100%  Height:        Intake/Output Summary (Last 24 hours) at 12/13/2023 1249 Last data filed at 12/13/2023 9074 Gross per 24 hour  Intake 117 ml  Output --  Net 117 ml      Filed Weights    Examination:  General exam: Sleeping in enclosure..  Nonverbal.  No distress.  Does not follow commands.  Data Reviewed: I have personally reviewed following labs and imaging studies  CBC: No results for input(s): WBC, NEUTROABS, HGB, HCT, MCV, PLT in the last 168 hours. Basic Metabolic Panel: No results for input(s): NA, K, CL, CO2, GLUCOSE, BUN, CREATININE, CALCIUM , MG, PHOS in the last 168 hours.   No results found for this or any previous visit (from the past 240 hours).   Radiology Studies: No results found.  Scheduled Meds:  feeding supplement  237 mL Oral BID BM   LORazepam   1 mg Oral TID   Continuous Infusions:   LOS: 275 days   [name], MD [facility] P8/23/2025, 12:49 PM

## 2023-12-13 NOTE — Plan of Care (Signed)
  Problem: Education: Goal: Knowledge of the prescribed therapeutic regimen will improve Outcome: Progressing   Problem: Coping: Goal: Ability to identify and develop effective coping behavior will improve Outcome: Progressing   Problem: Clinical Measurements: Goal: Quality of life will improve Outcome: Progressing   Problem: Respiratory: Goal: Verbalizations of increased ease of respirations will increase Outcome: Progressing   Problem: Role Relationship: Goal: Family's ability to cope with current situation will improve Outcome: Progressing Goal: Ability to verbalize concerns, feelings, and thoughts to partner or family member will improve Outcome: Progressing   Problem: Pain Management: Goal: Satisfaction with pain management regimen will improve Outcome: Progressing   

## 2023-12-14 NOTE — Progress Notes (Signed)
  PROGRESS NOTE  Natalie Brown  FMW:[id number] DOB: [birth year] DOA: 01/22/2023 PCP: [name], NP   Brief Narrative: Patient is a 58 year old female with PMH of bipolar disorder, early onset dementia who was initially admitted for failure to thrive .  Patient has had a long and complicated hospitalization spanning 2 years.  She was admitted in 2023 with increasingly erratic behavior and cognitive impairment.  She was evaluated by psychiatry and neurology but no treatable or reversible cause of her impairment was found.  Her hospital course was complicated by weight loss, malnutrition, failure to thrive .  She was briefly placed in a memory care unit in October 2024.  Upon arriving at the facility, patient exhibited agitation so patient was denied entry and sent back to the hospital the same day.  Patient has remained in the hospital since then.  Patient is nonverbal and does not follow any commands.  Patient can ambulate with assistance.  Currently, the goal of care is comfort care.  TOC team is following patient, and assisting with possible disposition.    Assessment & Plan: Early onset dementia with behavioral disturbances/bipolar disorder : Clinical scenario as above. Patient is mostly nonverbal and does not follow any commands.  She ambulates with assistance. Mostly kept in safety cage through out the day since she is not redirectable. -Continue Ativan  1 mg ,3 times daily -Valium  and trazodone  as needed  Full comfort care - Symptomatic management as above.  CKD-3: Stable  Severe protein calorie malnutrition: Dietitian was following.  Needs assistance with meals. Nutrition Problem: Severe Malnutrition Etiology: chronic illness    DVT prophylaxis:     Code Status: Limited: Do not attempt resuscitation (DNR) -DNR-LIMITED -Do Not Intubate/DNI   Family Communication: None at bedside  Patient status:Inpatient  Patient is from :Home  Anticipated discharge to:not sure  Estimated  DC date:not sure   Consultants: Psychiatry, neurology and palliative care  Procedures: None yet  Antimicrobials:  Anti-infectives (From admission, onward)    None       Subjective: No major events overnight of this morning.  Sleeping in enclosure bed.  No distress.  Objective: Vitals:   12/08/23 2030 12/09/23 1130 12/10/23 0110 12/13/23 0021  BP: 100/68 96/64 101/63 122/67  Pulse: 72 77 81 70  Resp: 17 16 16 17   Temp: 98.4 F (36.9 C) 97.7 F (36.5 C)    TempSrc: Axillary Axillary    SpO2:  100% 98% 100%  Height:        Intake/Output Summary (Last 24 hours) at 12/14/2023 1027 Last data filed at 12/14/2023 0900 Gross per 24 hour  Intake 355 ml  Output --  Net 355 ml      Filed Weights    Examination:  General exam: Sleeping in enclosure..  Nonverbal.  No distress.  Does not follow commands.  Data Reviewed: I have personally reviewed following labs and imaging studies  CBC: No results for input(s): WBC, NEUTROABS, HGB, HCT, MCV, PLT in the last 168 hours. Basic Metabolic Panel: No results for input(s): NA, K, CL, CO2, GLUCOSE, BUN, CREATININE, CALCIUM , MG, PHOS in the last 168 hours.   No results found for this or any previous visit (from the past 240 hours).   Radiology Studies: No results found.  Scheduled Meds:  feeding supplement  237 mL Oral BID BM   LORazepam   1 mg Oral TID   Continuous Infusions:   LOS: 276 days   [name], MD [facility] P8/24/2025, 10:27 AM

## 2023-12-14 NOTE — Plan of Care (Signed)
  Problem: Education: Goal: Knowledge of the prescribed therapeutic regimen will improve Outcome: Progressing   Problem: Coping: Goal: Ability to identify and develop effective coping behavior will improve Outcome: Progressing   Problem: Clinical Measurements: Goal: Quality of life will improve Outcome: Progressing   Problem: Respiratory: Goal: Verbalizations of increased ease of respirations will increase Outcome: Progressing   Problem: Role Relationship: Goal: Family's ability to cope with current situation will improve Outcome: Progressing Goal: Ability to verbalize concerns, feelings, and thoughts to partner or family member will improve Outcome: Progressing   Problem: Pain Management: Goal: Satisfaction with pain management regimen will improve Outcome: Progressing   

## 2023-12-15 NOTE — Plan of Care (Signed)
  Problem: Respiratory: Goal: Verbalizations of increased ease of respirations will increase Outcome: Progressing   Problem: Pain Management: Goal: Satisfaction with pain management regimen will improve Outcome: Progressing   

## 2023-12-15 NOTE — Progress Notes (Signed)
 PROGRESS NOTE  Natalie Brown  FMW:[id number] DOB: 1965-06-26 DOA: 01/22/2023 PCP: [name], NP   Brief Narrative: Patient is a 58 year old female with PMH of bipolar disorder, early onset dementia who was initially admitted for failure to thrive .  Patient has had a long and complicated hospitalization spanning 2 years.  She was admitted in 2023 with increasingly erratic behavior and cognitive impairment.  She was evaluated by psychiatry and neurology but no treatable or reversible cause of her impairment was found.  Her hospital course was complicated by weight loss, malnutrition, failure to thrive .  She was briefly placed in a memory care unit in October 2024.  Upon arriving at the facility, patient exhibited agitation so patient was denied entry and sent back to the hospital the same day.  Patient has remained in the hospital since then.  Patient is nonverbal and does not follow any commands.  Patient can ambulate with assistance.  Currently, the goal of care is comfort care.  TOC team is following patient, and assisting with possible disposition.    Assessment & Plan: Early onset dementia with behavioral disturbances/bipolar disorder : Clinical scenario as above. Patient is mostly nonverbal and does not follow any commands.  She ambulates with assistance. Mostly kept in safety cage through out the day since she is not redirectable. -Continue Ativan  1 mg ,3 times daily -Valium  and trazodone  as needed  Full comfort care - Symptomatic management as above.  CKD-3: Stable  Severe protein calorie malnutrition: Dietitian was following.  Needs assistance with meals. Nutrition Problem: Severe Malnutrition Etiology: chronic illness    DVT prophylaxis:     Code Status: Limited: Do not attempt resuscitation (DNR) -DNR-LIMITED -Do Not Intubate/DNI   Family Communication: None at bedside  Patient status:Inpatient  Patient is from :Home  Anticipated discharge to:not sure  Estimated  DC date:not sure   Consultants: Psychiatry, neurology and palliative care  Procedures: None yet  Antimicrobials:  Anti-infectives (From admission, onward)    None       Subjective: No major events overnight of this morning.  Sleeping in enclosure bed.  No distress.  Objective: Vitals:   12/09/23 1130 12/10/23 0110 12/13/23 0021 12/15/23 0522  BP: 96/64 101/63 122/67 95/73  Pulse: 77 81 70 (!) 56  Resp: 16 16 17 16   Temp: 97.7 F (36.5 C)   (!) 97.5 F (36.4 C)  TempSrc: Axillary   Axillary  SpO2: 100% 98% 100% 100%  Height:        Intake/Output Summary (Last 24 hours) at 12/15/2023 1121 Last data filed at 12/15/2023 0842 Gross per 24 hour  Intake 1010 ml  Output --  Net 1010 ml      Filed Weights    Examination:  General exam: Sleeping in enclosure..  Nonverbal.  No distress.  Does not follow commands.  Data Reviewed: I have personally reviewed following labs and imaging studies  CBC: No results for input(s): WBC, NEUTROABS, HGB, HCT, MCV, PLT in the last 168 hours. Basic Metabolic Panel: No results for input(s): NA, K, CL, CO2, GLUCOSE, BUN, CREATININE, CALCIUM , MG, PHOS in the last 168 hours.   No results found for this or any previous visit (from the past 240 hours).   Radiology Studies: No results found.  Scheduled Meds:  feeding supplement  237 mL Oral BID BM   LORazepam   1 mg Oral TID   Continuous Infusions:   LOS: 277 days   [name], MD [facility] P8/25/2025, 11:21 AM

## 2023-12-15 NOTE — Plan of Care (Signed)
  Problem: Education: Goal: Knowledge of the prescribed therapeutic regimen will improve Outcome: Progressing   Problem: Coping: Goal: Ability to identify and develop effective coping behavior will improve Outcome: Progressing   Problem: Clinical Measurements: Goal: Quality of life will improve Outcome: Progressing   Problem: Respiratory: Goal: Verbalizations of increased ease of respirations will increase Outcome: Progressing   Problem: Role Relationship: Goal: Family's ability to cope with current situation will improve Outcome: Progressing Goal: Ability to verbalize concerns, feelings, and thoughts to partner or family member will improve Outcome: Progressing   Problem: Pain Management: Goal: Satisfaction with pain management regimen will improve Outcome: Progressing   

## 2023-12-16 NOTE — Progress Notes (Signed)
  PROGRESS NOTE  Natalie Brown  FMW:[id number] DOB: 14-Apr-1966 DOA: 01/22/2023 PCP: [name], NP   Brief Narrative: Patient is a 58 year old female with PMH of bipolar disorder, early onset dementia who was initially admitted for failure to thrive .  Patient has had a long and complicated hospitalization spanning 2 years.  She was admitted in 2023 with increasingly erratic behavior and cognitive impairment.  She was evaluated by psychiatry and neurology but no treatable or reversible cause of her impairment was found.  Her hospital course was complicated by weight loss, malnutrition, failure to thrive .  She was briefly placed in a memory care unit in October 2024.  Upon arriving at the facility, patient exhibited agitation so patient was denied entry and sent back to the hospital the same day.  Patient has remained in the hospital since then.  Patient is nonverbal and does not follow any commands.  Patient can ambulate with assistance.  Currently, the goal of care is comfort care.  TOC team is following patient, and assisting with possible disposition.    Assessment & Plan: Early onset dementia with behavioral disturbances/bipolar disorder : Clinical scenario as above. Patient is mostly nonverbal and does not follow any commands.  She ambulates with assistance. Mostly kept in safety cage through out the day since she is not redirectable. -Continue Ativan  1 mg ,3 times daily -Valium  and trazodone  as needed  Full comfort care - Symptomatic management as above.  CKD-3: Stable  Severe protein calorie malnutrition: Dietitian was following.  Needs assistance with meals. Nutrition Problem: Severe Malnutrition Etiology: chronic illness    DVT prophylaxis:     Code Status: Limited: Do not attempt resuscitation (DNR) -DNR-LIMITED -Do Not Intubate/DNI   Family Communication: None at bedside  Patient status:Inpatient  Patient is from :Home  Anticipated discharge to:not sure  Estimated  DC date:not sure   Consultants: Psychiatry, neurology and palliative care  Procedures: None yet  Antimicrobials:  Anti-infectives (From admission, onward)    None       Subjective: No major events overnight of this morning.  Sleeping in enclosure bed.  No distress.  Objective: Vitals:   12/09/23 1130 12/10/23 0110 12/13/23 0021 12/15/23 0522  BP: 96/64 101/63 122/67 95/73  Pulse: 77 81 70 (!) 56  Resp: 16 16 17 16   Temp:    (!) 97.5 F (36.4 C)  TempSrc: Axillary   Axillary  SpO2: 100% 98% 100% 100%  Height:        Intake/Output Summary (Last 24 hours) at 12/16/2023 1132 Last data filed at 12/15/2023 1242 Gross per 24 hour  Intake 240 ml  Output --  Net 240 ml      Filed Weights    Examination:  General exam: Sleeping in enclosure..  Nonverbal.  No distress.  Does not follow commands.  Data Reviewed: I have personally reviewed following labs and imaging studies  CBC: No results for input(s): WBC, NEUTROABS, HGB, HCT, MCV, PLT in the last 168 hours. Basic Metabolic Panel: No results for input(s): NA, K, CL, CO2, GLUCOSE, BUN, CREATININE, CALCIUM , MG, PHOS in the last 168 hours.   No results found for this or any previous visit (from the past 240 hours).   Radiology Studies: No results found.  Scheduled Meds:  feeding supplement  237 mL Oral BID BM   LORazepam   1 mg Oral TID   Continuous Infusions:   LOS: 278 days   [name], MD [facility] P8/26/2025, 11:32 AM

## 2023-12-16 NOTE — Plan of Care (Signed)
  Problem: Clinical Measurements: Goal: Quality of life will improve Outcome: Progressing   Problem: Respiratory: Goal: Verbalizations of increased ease of respirations will increase Outcome: Progressing   Problem: Pain Management: Goal: Satisfaction with pain management regimen will improve Outcome: Progressing   

## 2023-12-17 NOTE — Plan of Care (Signed)
  Problem: Education: Goal: Knowledge of the prescribed therapeutic regimen will improve Outcome: Progressing   Problem: Coping: Goal: Ability to identify and develop effective coping behavior will improve Outcome: Progressing   Problem: Clinical Measurements: Goal: Quality of life will improve Outcome: Progressing   Problem: Role Relationship: Goal: Ability to verbalize concerns, feelings, and thoughts to partner or family member will improve Outcome: Progressing

## 2023-12-17 NOTE — Progress Notes (Signed)
 PROGRESS NOTE    Natalie Brown  FMW:[id number] DOB: Jan 07, 1966 DOA: 01/22/2023 PCP: [name], NP   Brief Narrative:   58 year old female with PMH of bipolar disorder, early onset dementia who was initially admitted for failure to thrive . Patient has had a long and complicated hospitalization spanning 2 years. She was admitted in 2023 with increasingly erratic behavior and cognitive impairment. She was evaluated by psychiatry and neurology but no treatable or reversible cause of her impairment was found. Her hospital course was complicated by weight loss, malnutrition, failure to thrive . She was briefly placed in a memory care unit in October 2024. Upon arriving at the facility, patient exhibited agitation so patient was denied entry and sent back to the hospital the same day. Patient has remained in the hospital since then. Patient is nonverbal and does not follow any commands. Patient can ambulate with assistance. Currently, the goal of care is comfort care.   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 Patient is mostly nonverbal and does not follow any commands.  She ambulates with assistance. Mostly kept in safety cage through out the day since she is not redirectable. -Continue Ativan  1 mg ,3 times daily -Valium  and trazodone  as needed   Full comfort care - Symptomatic management as above.   CKD-3: Stable   Severe protein calorie malnutrition: Dietitian was following.  Needs assistance with meals. Nutrition Problem: Severe Malnutrition Etiology: chronic illness  DVT prophylaxis:  Not indicated     Code Status: Limited: Do  not attempt resuscitation (DNR) -DNR-LIMITED -Do Not Intubate/DNI  Family Communication:  None at the bedside Status is: Inpatient Remains inpatient appropriate because:on comfort care    Subjective:  No acute events overnight. Non-verbal and sleeping in enclosure bed.  Examination:  General exam: Non-verbal, doesn't appear to be in distress Respiratory system: Clear to auscultation. Respiratory effort normal. Cardiovascular system: S1 & S2 heard, RRR. No JVD, murmurs, rubs, gallops or clicks. No pedal edema. Gastrointestinal system: Abdomen is nondistended, soft and nontender. No organomegaly or masses felt. Normal bowel sounds heard. Extremities: Unable to assess motor/sensory systems       Diet Orders (From admission, onward)     Start     Ordered   03/13/23 1330  Diet regular Room service appropriate? Yes; Fluid consistency: Thin  Diet effective now       Question Answer Comment  Room service appropriate? Yes   Fluid consistency: Thin      03/13/23 1330            Objective: Vitals:   12/09/23 1130 12/10/23 0110 12/13/23 0021 12/15/23 0522  BP: 96/64 101/63 122/67 95/73  Pulse: 77 81 70 (!) 56  Resp: 16 16 17 16   Temp:    (!) 97.5 F (36.4 C)  TempSrc: Axillary   Axillary  SpO2: 100% 98% 100% 100%  Height:        Intake/Output Summary (Last 24 hours) at 12/17/2023 0958 Last data filed at 12/16/2023 1255 Gross per 24 hour  Intake 240 ml  Output --  Net 240 ml   Filed Weights    Scheduled Meds:  feeding supplement  237 mL  Oral BID BM   LORazepam   1 mg Oral TID   Continuous Infusions:  Nutritional status Signs/Symptoms: severe fat depletion, severe muscle depletion, energy intake < or equal to 50% for > or equal to 5 days Interventions: Magic cup Body mass index is 14.23 kg/m.  Data Reviewed:   CBC: No results for input(s): WBC, NEUTROABS, HGB, HCT, MCV, PLT in the last 168 hours. Basic Metabolic Panel: No results for input(s):  NA, K, CL, CO2, GLUCOSE, BUN, CREATININE, CALCIUM , MG, PHOS in the last 168 hours. GFR: CrCl cannot be calculated (Patient's most recent lab result is older than the maximum 21 days allowed.). Liver Function Tests: No results for input(s): AST, ALT, ALKPHOS, BILITOT, PROT, ALBUMIN  in the last 168 hours. No results for input(s): LIPASE, AMYLASE in the last 168 hours. No results for input(s): AMMONIA in the last 168 hours. Coagulation Profile: No results for input(s): INR, PROTIME in the last 168 hours. Cardiac Enzymes: No results for input(s): CKTOTAL, CKMB, CKMBINDEX, TROPONINI in the last 168 hours. BNP (last 3 results) No results for input(s): PROBNP in the last 8760 hours. HbA1C: No results for input(s): HGBA1C in the last 72 hours. CBG: No results for input(s): GLUCAP in the last 168 hours. Lipid Profile: No results for input(s): CHOL, HDL, LDLCALC, TRIG, CHOLHDL, LDLDIRECT in the last 72 hours. Thyroid Function Tests: No results for input(s): TSH, T4TOTAL, FREET4, T3FREE, THYROIDAB in the last 72 hours. Anemia Panel: No results for input(s): VITAMINB12, FOLATE, FERRITIN, TIBC, IRON, RETICCTPCT in the last 72 hours. Sepsis Labs: No results for input(s): PROCALCITON, LATICACIDVEN in the last 168 hours.  No results found for this or any previous visit (from the past 240 hours).       Radiology Studies: No results found.         LOS: 279 days   Time spent= 35 mins    [name], MD [facility]  If 7PM-7AM, please contact night-coverage  12/17/2023, 9:58 AM

## 2023-12-18 NOTE — Progress Notes (Signed)
 PROGRESS NOTE    Natalie Brown  FMW:[id number] DOB: Jun 10, 1965 DOA: 01/22/2023 PCP: [name], NP   Brief Narrative:   58 year old female with PMH of bipolar disorder, early onset dementia who was initially admitted for failure to thrive . Patient has had a long and complicated hospitalization spanning 2 years. She was admitted in 2023 with increasingly erratic behavior and cognitive impairment. She was evaluated by psychiatry and neurology but no treatable or reversible cause of her impairment was found. Her hospital course was complicated by weight loss, malnutrition, failure to thrive . She was briefly placed in a memory care unit in October 2024. Upon arriving at the facility, patient exhibited agitation so patient was denied entry and sent back to the hospital the same day. Patient has remained in the hospital since then. Patient is nonverbal and does not follow any commands. Patient can ambulate with assistance. Currently, the goal of care is comfort care.   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 Patient is mostly nonverbal and does not follow any commands.  She ambulates with assistance. Mostly kept in safety cage through out the day since she is not redirectable. -Continue Ativan  1 mg ,3 times daily -Valium  and trazodone  as needed   Full comfort care - Symptomatic management as above.   CKD-3: Stable   Severe protein calorie malnutrition: Dietitian was following.  Needs assistance with meals. Nutrition Problem: Severe Malnutrition Etiology: chronic illness  DVT prophylaxis:  Not indicated     Code Status: Limited: Do  not attempt resuscitation (DNR) -DNR-LIMITED -Do Not Intubate/DNI  Family Communication:  None at the bedside Status is: Inpatient Remains inpatient appropriate because:on comfort care    Subjective:  No acute events overnight. Non-verbal and sleeping in enclosure bed.  Examination:  General exam: Non-verbal, doesn't appear to be in distress Respiratory system: Clear to auscultation. Respiratory effort normal. Cardiovascular system: S1 & S2 heard, RRR. No JVD, murmurs, rubs, gallops or clicks. No pedal edema. Gastrointestinal system: Abdomen is nondistended, soft and nontender. No organomegaly or masses felt. Normal bowel sounds heard. Extremities: Unable to assess motor/sensory systems       Diet Orders (From admission, onward)     Start     Ordered   03/13/23 1330  Diet regular Room service appropriate? Yes; Fluid consistency: Thin  Diet effective now       Question Answer Comment  Room service appropriate? Yes   Fluid consistency: Thin      03/13/23 1330            Objective: Vitals:   12/09/23 1130 12/10/23 0110 12/13/23 0021 12/15/23 0522  BP: 96/64 101/63 122/67 95/73  Pulse: 77 81 70 (!) 56  Resp: 16 16 17 16   Temp:    (!) 97.5 F (36.4 C)  TempSrc: Axillary   Axillary  SpO2: 100% 98% 100% 100%  Height:        Intake/Output Summary (Last 24 hours) at 12/18/2023 1002 Last data filed at 12/17/2023 1337 Gross per 24 hour  Intake 117 ml  Output --  Net 117 ml   Filed Weights    Scheduled Meds:  feeding supplement  237 mL  Oral BID BM   LORazepam   1 mg Oral TID   Continuous Infusions:  Nutritional status Signs/Symptoms: severe fat depletion, severe muscle depletion, energy intake < or equal to 50% for > or equal to 5 days Interventions: Magic cup Body mass index is 14.23 kg/m.  Data Reviewed:   CBC: No results for input(s): WBC, NEUTROABS, HGB, HCT, MCV, PLT in the last 168 hours. Basic Metabolic Panel: No results for input(s):  NA, K, CL, CO2, GLUCOSE, BUN, CREATININE, CALCIUM , MG, PHOS in the last 168 hours. GFR: CrCl cannot be calculated (Patient's most recent lab result is older than the maximum 21 days allowed.). Liver Function Tests: No results for input(s): AST, ALT, ALKPHOS, BILITOT, PROT, ALBUMIN  in the last 168 hours. No results for input(s): LIPASE, AMYLASE in the last 168 hours. No results for input(s): AMMONIA in the last 168 hours. Coagulation Profile: No results for input(s): INR, PROTIME in the last 168 hours. Cardiac Enzymes: No results for input(s): CKTOTAL, CKMB, CKMBINDEX, TROPONINI in the last 168 hours. BNP (last 3 results) No results for input(s): PROBNP in the last 8760 hours. HbA1C: No results for input(s): HGBA1C in the last 72 hours. CBG: No results for input(s): GLUCAP in the last 168 hours. Lipid Profile: No results for input(s): CHOL, HDL, LDLCALC, TRIG, CHOLHDL, LDLDIRECT in the last 72 hours. Thyroid Function Tests: No results for input(s): TSH, T4TOTAL, FREET4, T3FREE, THYROIDAB in the last 72 hours. Anemia Panel: No results for input(s): VITAMINB12, FOLATE, FERRITIN, TIBC, IRON, RETICCTPCT in the last 72 hours. Sepsis Labs: No results for input(s): PROCALCITON, LATICACIDVEN in the last 168 hours.  No results found for this or any previous visit (from the past 240 hours).       Radiology Studies: No results found.         LOS: 280 days   Time spent= 35 mins    [name], MD [facility]  If 7PM-7AM, please contact night-coverage  12/18/2023, 10:02 AM

## 2023-12-19 NOTE — Plan of Care (Signed)
  Problem: Education: Goal: Knowledge of the prescribed therapeutic regimen will improve 12/19/2023 2124 by Jennine Kiki HERO, RN Outcome: Progressing 12/19/2023 2123 by Jennine Kiki HERO, RN Outcome: Progressing   Problem: Coping: Goal: Ability to identify and develop effective coping behavior will improve 12/19/2023 2124 by Jennine Kiki HERO, RN Outcome: Progressing 12/19/2023 2123 by Jennine Kiki HERO, RN Outcome: Progressing   Problem: Clinical Measurements: Goal: Quality of life will improve Outcome: Progressing   Problem: Safety: Goal: Non-violent Restraint(s) Outcome: Progressing

## 2023-12-19 NOTE — Plan of Care (Signed)
  Problem: Education: Goal: Knowledge of the prescribed therapeutic regimen will improve Outcome: Progressing   Problem: Coping: Goal: Ability to identify and develop effective coping behavior will improve Outcome: Progressing   Problem: Role Relationship: Goal: Family's ability to cope with current situation will improve Outcome: Progressing Goal: Ability to verbalize concerns, feelings, and thoughts to partner or family member will improve Outcome: Progressing

## 2023-12-19 NOTE — Progress Notes (Signed)
 PROGRESS NOTE    Natalie Brown  FMW:[id number] DOB: Mar 07, 1966 DOA: 01/22/2023 PCP: [name], NP   Brief Narrative:   58 year old female with PMH of bipolar disorder, early onset dementia who was initially admitted for failure to thrive . Patient has had a long and complicated hospitalization spanning 2 years. She was admitted in 2023 with increasingly erratic behavior and cognitive impairment. She was evaluated by psychiatry and neurology but no treatable or reversible cause of her impairment was found. Her hospital course was complicated by weight loss, malnutrition, failure to thrive . She was briefly placed in a memory care unit in October 2024. Upon arriving at the facility, patient exhibited agitation so patient was denied entry and sent back to the hospital the same day. Patient has remained in the hospital since then. Patient is nonverbal and does not follow any commands. Patient can ambulate with assistance. Currently, the goal of care is comfort care.   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 Patient is mostly nonverbal and does not follow any commands.  She ambulates with assistance. Mostly kept in safety cage through out the day since she is not redirectable. -Dced scheduled ativan  on 8/28. -Valium  and trazodone  as needed   Full comfort care - Symptomatic management as above.   CKD-3: Stable   Severe protein calorie malnutrition: Dietitian was following.  Needs assistance with meals. Nutrition Problem: Severe Malnutrition Etiology: chronic illness  DVT prophylaxis:  Not indicated     Code Status: Limited: Do not  attempt resuscitation (DNR) -DNR-LIMITED -Do Not Intubate/DNI  Family Communication:  None at the bedside Status is: Inpatient Remains inpatient appropriate because:on comfort care    Subjective:  No acute events overnight. Non-verbal and sleeping in enclosure bed.  Examination:  General exam: Non-verbal, doesn't appear to be in distress Respiratory system: Clear to auscultation. Respiratory effort normal. Cardiovascular system: S1 & S2 heard, RRR. No JVD, murmurs, rubs, gallops or clicks. No pedal edema. Gastrointestinal system: Abdomen is nondistended, soft and nontender. No organomegaly or masses felt. Normal bowel sounds heard. Extremities: Unable to assess motor/sensory systems       Diet Orders (From admission, onward)     Start     Ordered   03/13/23 1330  Diet regular Room service appropriate? Yes; Fluid consistency: Thin  Diet effective now       Question Answer Comment  Room service appropriate? Yes   Fluid consistency: Thin      03/13/23 1330            Objective: Vitals:   12/09/23 1130 12/10/23 0110 12/13/23 0021 12/15/23 0522  BP: 96/64 101/63 122/67 95/73  Pulse: 77 81 70 (!) 56  Resp: 16 16 17 16   Temp:    (!) 97.5 F (36.4 C)  TempSrc: Axillary   Axillary  SpO2: 100% 98% 100% 100%  Height:        Intake/Output Summary (Last 24 hours) at 12/19/2023 9061 Last data filed at 12/18/2023 1029 Gross per 24 hour  Intake 237 ml  Output --  Net 237 ml   Filed Weights    Scheduled Meds:  feeding supplement  237 mL Oral BID  BM   Continuous Infusions:  Nutritional status Signs/Symptoms: severe fat depletion, severe muscle depletion, energy intake < or equal to 50% for > or equal to 5 days Interventions: Magic cup Body mass index is 14.23 kg/m.  Data Reviewed:   CBC: No results for input(s): WBC, NEUTROABS, HGB, HCT, MCV, PLT in the last 168 hours. Basic Metabolic Panel: No results for input(s): NA, K, CL, CO2,  GLUCOSE, BUN, CREATININE, CALCIUM , MG, PHOS in the last 168 hours. GFR: CrCl cannot be calculated (Patient's most recent lab result is older than the maximum 21 days allowed.). Liver Function Tests: No results for input(s): AST, ALT, ALKPHOS, BILITOT, PROT, ALBUMIN  in the last 168 hours. No results for input(s): LIPASE, AMYLASE in the last 168 hours. No results for input(s): AMMONIA in the last 168 hours. Coagulation Profile: No results for input(s): INR, PROTIME in the last 168 hours. Cardiac Enzymes: No results for input(s): CKTOTAL, CKMB, CKMBINDEX, TROPONINI in the last 168 hours. BNP (last 3 results) No results for input(s): PROBNP in the last 8760 hours. HbA1C: No results for input(s): HGBA1C in the last 72 hours. CBG: No results for input(s): GLUCAP in the last 168 hours. Lipid Profile: No results for input(s): CHOL, HDL, LDLCALC, TRIG, CHOLHDL, LDLDIRECT in the last 72 hours. Thyroid Function Tests: No results for input(s): TSH, T4TOTAL, FREET4, T3FREE, THYROIDAB in the last 72 hours. Anemia Panel: No results for input(s): VITAMINB12, FOLATE, FERRITIN, TIBC, IRON, RETICCTPCT in the last 72 hours. Sepsis Labs: No results for input(s): PROCALCITON, LATICACIDVEN in the last 168 hours.  No results found for this or any previous visit (from the past 240 hours).       Radiology Studies: No results found.         LOS: 281 days   Time spent= 35 mins    [name], MD [facility]  If 7PM-7AM, please contact night-coverage  12/19/2023, 9:38 AM

## 2023-12-20 MED ORDER — LORAZEPAM 1 MG PO TABS: 2.0000 mg | ORAL_TABLET | Freq: Two times a day (BID) | ORAL | Status: DC

## 2023-12-20 MED ORDER — LORAZEPAM 2 MG/ML IJ SOLN: 1.0000 mg | Freq: Three times a day (TID) | INTRAMUSCULAR | Status: DC

## 2023-12-20 MED ORDER — LORAZEPAM 2 MG/ML PO CONC
2.0000 mg | Freq: Two times a day (BID) | ORAL | Status: DC
  Administered 2023-12-20 – 2023-12-25 (×11): 2 mg via ORAL
  Filled 2023-12-20 (×11): qty 1

## 2023-12-20 NOTE — Progress Notes (Addendum)
 PROGRESS NOTE    Natalie Brown  FMW:[id number] DOB: 05/12/1965 DOA: 01/22/2023 PCP: [name], NP   Brief Narrative:   58 year old female with PMH of bipolar disorder, early onset dementia who was initially admitted for failure to thrive . Patient has had a long and complicated hospitalization spanning 2 years. She was admitted in 2023 with increasingly erratic behavior and cognitive impairment. She was evaluated by psychiatry and neurology but no treatable or reversible cause of her impairment was found. Her hospital course was complicated by weight loss, malnutrition, failure to thrive . She was briefly placed in a memory care unit in October 2024. Upon arriving at the facility, patient exhibited agitation so patient was denied entry and sent back to the hospital the same day. Patient has remained in the hospital since then. Patient is nonverbal and does not follow any commands. Patient can ambulate with assistance. Currently, the goal of care is comfort care.   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 Patient is mostly nonverbal and does not follow any commands.  She ambulates with assistance. Mostly kept in safety cage through out the day since she is not redirectable. -Dced scheduled ativan  on 8/28. She was having agitation and restlessness so started oral liquid lorazepam  on 8/30. -Valium  and trazodone  as needed   Full comfort care - Symptomatic management as above.   CKD-3: Stable   Severe protein calorie malnutrition: Dietitian was following.  Needs assistance with meals. Nutrition Problem: Severe Malnutrition Etiology:  chronic illness  DVT prophylaxis:  Not indicated     Code Status: Limited: Do not attempt resuscitation (DNR) -DNR-LIMITED -Do Not Intubate/DNI  Family Communication:  None at the bedside Status is: Inpatient Remains inpatient appropriate because:on comfort care    Subjective:  No acute events overnight. Non-verbal and sleeping in enclosure bed.  Examination:  General exam: Non-verbal, doesn't appear to be in distress Respiratory system: Clear to auscultation. Respiratory effort normal. Cardiovascular system: S1 & S2 heard, RRR. No JVD, murmurs, rubs, gallops or clicks. No pedal edema. Gastrointestinal system: Abdomen is nondistended, soft and nontender. No organomegaly or masses felt. Normal bowel sounds heard. Extremities: Unable to assess motor/sensory systems       Diet Orders (From admission, onward)     Start     Ordered   03/13/23 1330  Diet regular Room service appropriate? Yes; Fluid consistency: Thin  Diet effective now       Question Answer Comment  Room service appropriate? Yes   Fluid consistency: Thin      03/13/23 1330            Objective: Vitals:   12/09/23 1130 12/10/23 0110 12/13/23 0021 12/15/23 0522  BP: 96/64 101/63 122/67 95/73  Pulse: 77 81 70 (!) 56  Resp: 16 16 17 16   Temp:    (!) 97.5 F (36.4 C)  TempSrc: Axillary   Axillary  SpO2: 100% 98% 100% 100%  Height:        Intake/Output Summary (Last 24 hours) at 12/20/2023 0850 Last data filed at 12/19/2023 1218 Gross per 24 hour  Intake 577 ml  Output --  Net 577 ml   [employer]  Scheduled Meds:  feeding supplement  237 mL Oral BID BM   Continuous Infusions:  Nutritional status Signs/Symptoms: severe fat depletion, severe muscle depletion, energy intake < or equal to 50% for > or equal to 5 days Interventions: Magic cup Body mass index is 14.23 kg/m.  Data Reviewed:   CBC: No results for input(s): WBC, NEUTROABS, HGB, HCT, MCV, PLT in the last 168  hours. Basic Metabolic Panel: No results for input(s): NA, K, CL, CO2, GLUCOSE, BUN, CREATININE, CALCIUM , MG, PHOS in the last 168 hours. GFR: CrCl cannot be calculated (Patient's most recent lab result is older than the maximum 21 days allowed.). Liver Function Tests: No results for input(s): AST, ALT, ALKPHOS, BILITOT, PROT, ALBUMIN  in the last 168 hours. No results for input(s): LIPASE, AMYLASE in the last 168 hours. No results for input(s): AMMONIA in the last 168 hours. Coagulation Profile: No results for input(s): INR, PROTIME in the last 168 hours. Cardiac Enzymes: No results for input(s): CKTOTAL, CKMB, CKMBINDEX, TROPONINI in the last 168 hours. BNP (last 3 results) No results for input(s): PROBNP in the last 8760 hours. HbA1C: No results for input(s): HGBA1C in the last 72 hours. CBG: No results for input(s): GLUCAP in the last 168 hours. Lipid Profile: No results for input(s): CHOL, HDL, LDLCALC, TRIG, CHOLHDL, LDLDIRECT in the last 72 hours. Thyroid Function Tests: No results for input(s): TSH, T4TOTAL, FREET4, T3FREE, THYROIDAB in the last 72 hours. Anemia Panel: No results for input(s): VITAMINB12, FOLATE, FERRITIN, TIBC, IRON, RETICCTPCT in the last 72 hours. Sepsis Labs: No results for input(s): PROCALCITON, LATICACIDVEN in the last 168 hours.  No results found for this or any previous visit (from the past 240 hours).       Radiology Studies: No results found.         LOS: 282 days   Time spent= 35 mins    [name], MD [facility]  If 7PM-7AM, please contact night-coverage  12/20/2023, 8:50 AM

## 2023-12-20 NOTE — Plan of Care (Signed)
  Problem: Education: Goal: Knowledge of the prescribed therapeutic regimen will improve Outcome: Progressing   Problem: Coping: Goal: Ability to identify and develop effective coping behavior will improve Outcome: Progressing   Problem: Role Relationship: Goal: Family's ability to cope with current situation will improve Outcome: Progressing   Problem: Safety: Goal: Non-violent Restraint(s) Outcome: Progressing

## 2023-12-20 NOTE — Plan of Care (Signed)
  Problem: Education: Goal: Knowledge of the prescribed therapeutic regimen will improve Outcome: Progressing   Problem: Coping: Goal: Ability to identify and develop effective coping behavior will improve Outcome: Progressing   Problem: Role Relationship: Goal: Family's ability to cope with current situation will improve Outcome: Progressing Goal: Ability to verbalize concerns, feelings, and thoughts to partner or family member will improve Outcome: Progressing   Problem: Safety: Goal: Non-violent Restraint(s) Outcome: Progressing

## 2023-12-21 NOTE — Progress Notes (Signed)
 PROGRESS NOTE    Natalie Brown  FMW:[id number] DOB: 1965/05/27 DOA: 01/22/2023 PCP: [name], NP   Brief Narrative:   58 year old female with PMH of bipolar disorder, early onset dementia who was initially admitted for failure to thrive . Patient has had a long and complicated hospitalization spanning 2 years. She was admitted in 2023 with increasingly erratic behavior and cognitive impairment. She was evaluated by psychiatry and neurology but no treatable or reversible cause of her impairment was found. Her hospital course was complicated by weight loss, malnutrition, failure to thrive . She was briefly placed in a memory care unit in October 2024. Upon arriving at the facility, patient exhibited agitation so patient was denied entry and sent back to the hospital the same day. Patient has remained in the hospital since then. Patient is nonverbal and does not follow any commands. Patient can ambulate with assistance. Currently, the goal of care is comfort care.   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 Patient is mostly nonverbal and does not follow any commands.  She ambulates with assistance. Mostly kept in safety cage through out the day since she is not redirectable. -Dced scheduled ativan  on 8/28. She was having agitation and restlessness so started oral liquid lorazepam  on 8/30. -Valium  and trazodone  as needed   Full comfort care - Symptomatic management as above.   CKD-3: Stable   Severe protein calorie malnutrition: Dietitian was following.  Needs assistance with meals. Nutrition Problem: Severe Malnutrition Etiology:  chronic illness  DVT prophylaxis:  Not indicated     Code Status: Limited: Do not attempt resuscitation (DNR) -DNR-LIMITED -Do Not Intubate/DNI  Family Communication:  None at the bedside Status is: Inpatient Remains inpatient appropriate because:on comfort care    Subjective:  No acute events overnight. Non-verbal and sleeping in enclosure bed.  Examination:  General exam: Non-verbal, doesn't appear to be in distress Respiratory system: Clear to auscultation. Respiratory effort normal. Cardiovascular system: S1 & S2 heard, RRR. No JVD, murmurs, rubs, gallops or clicks. No pedal edema. Gastrointestinal system: Abdomen is nondistended, soft and nontender. No organomegaly or masses felt. Normal bowel sounds heard. Extremities: Unable to assess motor/sensory systems       Diet Orders (From admission, onward)     Start     Ordered   03/13/23 1330  Diet regular Room service appropriate? Yes; Fluid consistency: Thin  Diet effective now       Question Answer Comment  Room service appropriate? Yes   Fluid consistency: Thin      03/13/23 1330            Objective: Vitals:   12/09/23 1130 12/10/23 0110 12/13/23 0021 12/15/23 0522  BP: 96/64 101/63 122/67 95/73  Pulse: 77 81 70 (!) 56  Resp: 16 16 17 16   Temp:    (!) 97.5 F (36.4 C)  TempSrc: Axillary   Axillary  SpO2: 100% 98% 100% 100%  Height:        Intake/Output Summary (Last 24 hours) at 12/21/2023 0848 Last data filed at 12/20/2023 1700 Gross per 24 hour  Intake 200 ml  Output --  Net 200 ml   [employer]  Scheduled Meds:  feeding supplement  237 mL Oral BID BM   LORazepam   2 mg Oral BID   Continuous Infusions:  Nutritional status Signs/Symptoms: severe fat depletion, severe muscle depletion, energy intake < or equal to 50% for > or equal to 5 days Interventions: Magic cup Body mass index is 14.23 kg/m.  Data Reviewed:   CBC: No results for input(s): WBC, NEUTROABS, HGB, HCT,  MCV, PLT in the last 168 hours. Basic Metabolic Panel: No results for input(s): NA, K, CL, CO2, GLUCOSE, BUN, CREATININE, CALCIUM , MG, PHOS in the last 168 hours. GFR: CrCl cannot be calculated (Patient's most recent lab result is older than the maximum 21 days allowed.). Liver Function Tests: No results for input(s): AST, ALT, ALKPHOS, BILITOT, PROT, ALBUMIN  in the last 168 hours. No results for input(s): LIPASE, AMYLASE in the last 168 hours. No results for input(s): AMMONIA in the last 168 hours. Coagulation Profile: No results for input(s): INR, PROTIME in the last 168 hours. Cardiac Enzymes: No results for input(s): CKTOTAL, CKMB, CKMBINDEX, TROPONINI in the last 168 hours. BNP (last 3 results) No results for input(s): PROBNP in the last 8760 hours. HbA1C: No results for input(s): HGBA1C in the last 72 hours. CBG: No results for input(s): GLUCAP in the last 168 hours. Lipid Profile: No results for input(s): CHOL, HDL, LDLCALC, TRIG, CHOLHDL, LDLDIRECT in the last 72 hours. Thyroid Function Tests: No results for input(s): TSH, T4TOTAL, FREET4, T3FREE, THYROIDAB in the last 72 hours. Anemia Panel: No results for input(s): VITAMINB12, FOLATE, FERRITIN, TIBC, IRON, RETICCTPCT in the last 72 hours. Sepsis Labs: No results for input(s): PROCALCITON, LATICACIDVEN in the last 168 hours.  No results found for this or any previous visit (from the past 240 hours).       Radiology Studies: No results found.         LOS: 283 days   Time spent= 35 mins    [name], MD [facility]  If 7PM-7AM, please contact night-coverage  12/21/2023, 8:48 AM

## 2023-12-21 NOTE — Plan of Care (Signed)
  Problem: Education: Goal: Knowledge of the prescribed therapeutic regimen will improve Outcome: Progressing   Problem: Coping: Goal: Ability to identify and develop effective coping behavior will improve Outcome: Progressing   Problem: Clinical Measurements: Goal: Quality of life will improve Outcome: Progressing   Problem: Respiratory: Goal: Verbalizations of increased ease of respirations will increase Outcome: Progressing   Problem: Role Relationship: Goal: Family's ability to cope with current situation will improve Outcome: Progressing   

## 2023-12-22 NOTE — Plan of Care (Signed)
 completed

## 2023-12-22 NOTE — Progress Notes (Signed)
 PROGRESS NOTE    Natalie Brown  FMW:[id number] DOB: 12-Jul-1965 DOA: 01/22/2023 PCP: [name], NP   Brief Narrative:   58 year old female with PMH of bipolar disorder, early onset dementia who was initially admitted for failure to thrive . Patient has had a long and complicated hospitalization spanning 2 years. She was admitted in 2023 with increasingly erratic behavior and cognitive impairment. She was evaluated by psychiatry and neurology but no treatable or reversible cause of her impairment was found. Her hospital course was complicated by weight loss, malnutrition, failure to thrive . She was briefly placed in a memory care unit in October 2024. Upon arriving at the facility, patient exhibited agitation so patient was denied entry and sent back to the hospital the same day. Patient has remained in the hospital since then. Patient is nonverbal and does not follow any commands. Patient can ambulate with assistance. Currently, the goal of care is comfort care.   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 Patient is mostly nonverbal and does not follow any commands.  She ambulates with assistance. Mostly kept in safety cage through out the day since she is not redirectable. -Dced scheduled ativan  on 8/28. She was having agitation and restlessness so started oral liquid lorazepam  on 8/30. -Valium  and trazodone  as needed   Full comfort care - Symptomatic management as above.   CKD-3: Stable   Severe protein calorie malnutrition: Dietitian was following.  Needs assistance with meals. Nutrition Problem: Severe Malnutrition Etiology:  chronic illness  DVT prophylaxis:  Not indicated     Code Status: Limited: Do not attempt resuscitation (DNR) -DNR-LIMITED -Do Not Intubate/DNI  Family Communication:  None at the bedside Status is: Inpatient Remains inpatient appropriate because:on comfort care    Subjective:  No acute events overnight. She was sleeping in enclosure bed. She didn't respond to any commands.  Examination:  General exam: Non-verbal, doesn't appear to be in distress Respiratory system: Clear to auscultation. Respiratory effort normal. Cardiovascular system: S1 & S2 heard, RRR. No JVD, murmurs, rubs, gallops or clicks. No pedal edema. Gastrointestinal system: Abdomen is nondistended, soft and nontender. No organomegaly or masses felt. Normal bowel sounds heard. Extremities: Unable to assess motor/sensory systems       Diet Orders (From admission, onward)     Start     Ordered   03/13/23 1330  Diet regular Room service appropriate? Yes; Fluid consistency: Thin  Diet effective now       Question Answer Comment  Room service appropriate? Yes   Fluid consistency: Thin      03/13/23 1330            Objective: Vitals:   12/09/23 1130 12/10/23 0110 12/13/23 0021 12/15/23 0522  BP: 96/64 101/63 122/67 95/73  Pulse: 77 81 70 (!) 56  Resp: 16 16 17 16   Temp:    (!) 97.5 F (36.4 C)  TempSrc: Axillary   Axillary  SpO2: 100% 98% 100% 100%  Height:        Intake/Output Summary (Last 24 hours) at 12/22/2023 0843 Last data filed at 12/21/2023 0910 Gross per 24 hour  Intake 240 ml  Output --  Net  240 ml   Filed Weights    Scheduled Meds:  feeding supplement  237 mL Oral BID BM   LORazepam   2 mg Oral BID   Continuous Infusions:  Nutritional status Signs/Symptoms: severe fat depletion, severe muscle depletion, energy intake < or equal to 50% for > or equal to 5 days Interventions: Magic cup Body mass index is 14.23 kg/m.  Data Reviewed:   CBC: No results for input(s): WBC,  NEUTROABS, HGB, HCT, MCV, PLT in the last 168 hours. Basic Metabolic Panel: No results for input(s): NA, K, CL, CO2, GLUCOSE, BUN, CREATININE, CALCIUM , MG, PHOS in the last 168 hours. GFR: CrCl cannot be calculated (Patient's most recent lab result is older than the maximum 21 days allowed.). Liver Function Tests: No results for input(s): AST, ALT, ALKPHOS, BILITOT, PROT, ALBUMIN  in the last 168 hours. No results for input(s): LIPASE, AMYLASE in the last 168 hours. No results for input(s): AMMONIA in the last 168 hours. Coagulation Profile: No results for input(s): INR, PROTIME in the last 168 hours. Cardiac Enzymes: No results for input(s): CKTOTAL, CKMB, CKMBINDEX, TROPONINI in the last 168 hours. BNP (last 3 results) No results for input(s): PROBNP in the last 8760 hours. HbA1C: No results for input(s): HGBA1C in the last 72 hours. CBG: No results for input(s): GLUCAP in the last 168 hours. Lipid Profile: No results for input(s): CHOL, HDL, LDLCALC, TRIG, CHOLHDL, LDLDIRECT in the last 72 hours. Thyroid Function Tests: No results for input(s): TSH, T4TOTAL, FREET4, T3FREE, THYROIDAB in the last 72 hours. Anemia Panel: No results for input(s): VITAMINB12, FOLATE, FERRITIN, TIBC, IRON, RETICCTPCT in the last 72 hours. Sepsis Labs: No results for input(s): PROCALCITON, LATICACIDVEN in the last 168 hours.  No results found for this or any previous visit (from the past 240 hours).       Radiology Studies: No results found.         LOS: 284 days   Time spent= 35 mins    [name], MD [facility]  If 7PM-7AM, please contact night-coverage  12/22/2023, 8:43 AM

## 2023-12-23 NOTE — Plan of Care (Signed)
  Problem: Education: Goal: Knowledge of the prescribed therapeutic regimen will improve 12/23/2023 1811 by Virgene Stamps, RN Outcome: Progressing 12/23/2023 1811 by Virgene Stamps, RN Outcome: Progressing   Problem: Respiratory: Goal: Verbalizations of increased ease of respirations will increase Outcome: Progressing   Problem: Role Relationship: Goal: Family's ability to cope with current situation will improve 12/23/2023 1811 by Virgene Stamps, RN Outcome: Progressing 12/23/2023 1811 by Virgene Stamps, RN Outcome: Progressing Goal: Ability to verbalize concerns, feelings, and thoughts to partner or family member will improve 12/23/2023 1811 by Virgene Stamps, RN Outcome: Progressing 12/23/2023 1811 by Virgene Stamps, RN Outcome: Progressing   Problem: Safety: Goal: Non-violent Restraint(s) 12/23/2023 1811 by Virgene Stamps, RN Outcome: Progressing 12/23/2023 1811 by Virgene Stamps, RN Outcome: Progressing

## 2023-12-23 NOTE — Plan of Care (Signed)
  Problem: Education: Goal: Knowledge of the prescribed therapeutic regimen will improve Outcome: Progressing   Problem: Coping: Goal: Ability to identify and develop effective coping behavior will improve Outcome: Progressing   Problem: Clinical Measurements: Goal: Quality of life will improve Outcome: Progressing   Problem: Respiratory: Goal: Verbalizations of increased ease of respirations will increase Outcome: Progressing   Problem: Role Relationship: Goal: Family's ability to cope with current situation will improve Outcome: Progressing Goal: Ability to verbalize concerns, feelings, and thoughts to partner or family member will improve Outcome: Progressing   Problem: Pain Management: Goal: Satisfaction with pain management regimen will improve Outcome: Progressing   Problem: Safety: Goal: Non-violent Restraint(s) Outcome: Progressing

## 2023-12-23 NOTE — Progress Notes (Signed)
 PROGRESS NOTE    Natalie Brown  FMW:[id number] DOB: 02/25/1966 DOA: 01/22/2023 PCP: [name], NP   Brief Narrative:   58 year old female with PMH of bipolar disorder, early onset dementia who was initially admitted for failure to thrive . Patient has had a long and complicated hospitalization spanning 2 years. She was admitted in 2023 with increasingly erratic behavior and cognitive impairment. She was evaluated by psychiatry and neurology but no treatable or reversible cause of her impairment was found. Her hospital course was complicated by weight loss, malnutrition, failure to thrive . She was briefly placed in a memory care unit in October 2024. Upon arriving at the facility, patient exhibited agitation so patient was denied entry and sent back to the hospital the same day. Patient has remained in the hospital since then. Patient is nonverbal and does not follow any commands. Patient can ambulate with assistance. Currently, the goal of care is comfort care.   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 Patient is mostly nonverbal and does not follow any commands.  She ambulates with assistance. Mostly kept in safety cage through out the day since she is not redirectable. -Dced scheduled ativan  on 8/28. She was having agitation and restlessness so started oral liquid lorazepam  on 8/30. -Valium  and trazodone  as needed   Full comfort care - Symptomatic management as above.   CKD-3: Stable   Severe protein calorie malnutrition: Dietitian was following.  Needs assistance with meals. Nutrition Problem: Severe Malnutrition Etiology:  chronic illness  DVT prophylaxis:  Not indicated     Code Status: Limited: Do not attempt resuscitation (DNR) -DNR-LIMITED -Do Not Intubate/DNI  Family Communication:  None at the bedside Status is: Inpatient Remains inpatient appropriate because:on comfort care    Subjective:  No acute events overnight. She was sleeping in enclosure bed. She didn't respond to any commands.  Examination:  General exam: Non-verbal, doesn't appear to be in distress Respiratory system: Clear to auscultation. Respiratory effort normal. Cardiovascular system: S1 & S2 heard, RRR. No JVD, murmurs, rubs, gallops or clicks. No pedal edema. Gastrointestinal system: Abdomen is nondistended, soft and nontender. No organomegaly or masses felt. Normal bowel sounds heard. Extremities: Unable to assess motor/sensory systems       Diet Orders (From admission, onward)     Start     Ordered   03/13/23 1330  Diet regular Room service appropriate? Yes; Fluid consistency: Thin  Diet effective now       Question Answer Comment  Room service appropriate? Yes   Fluid consistency: Thin      03/13/23 1330            Objective: Vitals:   12/09/23 1130 12/10/23 0110 12/13/23 0021 12/15/23 0522  BP: 96/64 101/63 122/67 95/73  Pulse: 77 81 70 (!) 56  Resp: 16 16 17 16   Temp:    (!) 97.5 F (36.4 C)  TempSrc: Axillary   Axillary  SpO2: 100% 98% 100% 100%  Height:        Intake/Output Summary (Last 24 hours) at 12/23/2023 0856 Last data filed at 12/22/2023 1758 Gross per 24 hour  Intake 317 ml  Output --  Net  317 ml   Filed Weights    Scheduled Meds:  feeding supplement  237 mL Oral BID BM   LORazepam   2 mg Oral BID   Continuous Infusions:  Nutritional status Signs/Symptoms: severe fat depletion, severe muscle depletion, energy intake < or equal to 50% for > or equal to 5 days Interventions: Magic cup Body mass index is 14.23 kg/m.  Data Reviewed:   CBC: No results for input(s): WBC,  NEUTROABS, HGB, HCT, MCV, PLT in the last 168 hours. Basic Metabolic Panel: No results for input(s): NA, K, CL, CO2, GLUCOSE, BUN, CREATININE, CALCIUM , MG, PHOS in the last 168 hours. GFR: CrCl cannot be calculated (Patient's most recent lab result is older than the maximum 21 days allowed.). Liver Function Tests: No results for input(s): AST, ALT, ALKPHOS, BILITOT, PROT, ALBUMIN  in the last 168 hours. No results for input(s): LIPASE, AMYLASE in the last 168 hours. No results for input(s): AMMONIA in the last 168 hours. Coagulation Profile: No results for input(s): INR, PROTIME in the last 168 hours. Cardiac Enzymes: No results for input(s): CKTOTAL, CKMB, CKMBINDEX, TROPONINI in the last 168 hours. BNP (last 3 results) No results for input(s): PROBNP in the last 8760 hours. HbA1C: No results for input(s): HGBA1C in the last 72 hours. CBG: No results for input(s): GLUCAP in the last 168 hours. Lipid Profile: No results for input(s): CHOL, HDL, LDLCALC, TRIG, CHOLHDL, LDLDIRECT in the last 72 hours. Thyroid Function Tests: No results for input(s): TSH, T4TOTAL, FREET4, T3FREE, THYROIDAB in the last 72 hours. Anemia Panel: No results for input(s): VITAMINB12, FOLATE, FERRITIN, TIBC, IRON, RETICCTPCT in the last 72 hours. Sepsis Labs: No results for input(s): PROCALCITON, LATICACIDVEN in the last 168 hours.  No results found for this or any previous visit (from the past 240 hours).       Radiology Studies: No results found.      LOS: 285 days   Time spent= 35 mins    [name], MD [facility]  If 7PM-7AM, please contact night-coverage  12/23/2023, 8:56 AM

## 2023-12-24 NOTE — Progress Notes (Signed)
  Progress Note   Patient: Natalie Brown FMW:[id number] DOB: 1965-07-23 DOA: 01/22/2023     286 DOS: the patient was seen and examined on 12/24/2023     Brief Narrative:    58 year old female with PMH of bipolar disorder, early onset dementia who was initially admitted for failure to thrive . Patient has had a long and complicated hospitalization spanning 2 years. She was admitted in 2023 with increasingly erratic behavior and cognitive impairment. She was evaluated by psychiatry and neurology but no treatable or reversible cause of her impairment was found. Her hospital course was complicated by weight loss, malnutrition, failure to thrive . She was briefly placed in a memory care unit in October 2024. Upon arriving at the facility, patient exhibited agitation so patient was denied entry and sent back to the hospital the same day. Patient has remained in the hospital since then. Patient is nonverbal and does not follow any commands. Patient can ambulate with assistance. Currently, the goal of care is comfort care.    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 Patient is mostly nonverbal and does not follow any commands.  She ambulates with assistance. Mostly kept in safety cage through out the day since she is not redirectable. -Dced scheduled ativan  on 8/28. She was having agitation and restlessness so started oral liquid lorazepam  on 8/30. -Valium  and trazodone  as needed   Full comfort care - Symptomatic management as above.   CKD-3: Stable   Severe protein calorie malnutrition: Dietitian was following.  Needs assistance with  meals. Nutrition Problem: Severe Malnutrition Etiology: chronic illness   DVT prophylaxis:  Not indicated       Code Status: Limited: Do not attempt resuscitation (DNR) -DNR-LIMITED -Do Not Intubate/DNI  Family Communication:  None at the bedside Status is: Inpatient Remains inpatient appropriate because:on comfort care       Subjective: No acute overnight events Patient does not communicate   Examination:   General exam: Non-verbal, doesn't appear to be in distress Respiratory system: Clear to auscultation. Respiratory effort normal. Cardiovascular system: S1 & S2 heard, RRR. No JVD, murmurs, rubs, gallops or clicks. No pedal edema. Gastrointestinal system: Abdomen is nondistended, soft and nontender. No organomegaly or masses felt. Normal bowel sounds heard. Extremities: Unable to assess motor/sensory systems    Vitals:   12/09/23 1130 12/10/23 0110 12/13/23 0021 12/15/23 0522  BP: 96/64 101/63 122/67 95/73  Pulse: 77 81 70 (!) 56  Resp: 16 16 17 16   Temp:    (!) 97.5 F (36.4 C)  TempSrc: Axillary   Axillary  SpO2: 100% 98% 100% 100%  Height:        Author: Drue ONEIDA Potter, MD 12/24/2023 1:27 PM  For on call review [email or web address].

## 2023-12-24 NOTE — Plan of Care (Signed)
  Problem: Coping: Goal: Ability to identify and develop effective coping behavior will improve Outcome: Progressing   Problem: Role Relationship: Goal: Family's ability to cope with current situation will improve Outcome: Progressing   Problem: Safety: Goal: Non-violent Restraint(s) Outcome: Progressing

## 2023-12-25 MED ORDER — LORAZEPAM 2 MG/ML PO CONC
2.0000 mg | Freq: Three times a day (TID) | ORAL | Status: DC
  Administered 2023-12-25 – 2024-01-14 (×60): 2 mg via ORAL
  Filled 2023-12-25 (×61): qty 1

## 2023-12-25 MED ORDER — LORAZEPAM 2 MG/ML PO CONC
2.0000 mg | Freq: Once | ORAL | Status: AC
  Administered 2023-12-25: 2 mg via ORAL
  Filled 2023-12-25: qty 1

## 2023-12-25 NOTE — Progress Notes (Signed)
  Progress Note   Patient: Natalie Brown FMW:[id number] DOB: 03/24/1966 DOA: 01/22/2023     287 DOS: the patient was seen and examined on 12/25/2023    Brief Narrative:    58 year old female with PMH of bipolar disorder, early onset dementia who was initially admitted for failure to thrive . Patient has had a long and complicated hospitalization spanning 2 years. She was admitted in 2023 with increasingly erratic behavior and cognitive impairment. She was evaluated by psychiatry and neurology but no treatable or reversible cause of her impairment was found. Her hospital course was complicated by weight loss, malnutrition, failure to thrive . She was briefly placed in a memory care unit in October 2024. Upon arriving at the facility, patient exhibited agitation so patient was denied entry and sent back to the hospital the same day. Patient has remained in the hospital since then. Patient is nonverbal and does not follow any commands. Patient can ambulate with assistance. Currently, the goal of care is comfort care.    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 Patient is mostly nonverbal and does not follow any commands.  She ambulates with assistance. Mostly kept in safety cage through out the day since she is not redirectable. -Dced scheduled ativan  on 8/28. She was having agitation and restlessness so started oral liquid lorazepam  on 8/30. -Valium  and trazodone  as needed   Full comfort care - Symptomatic management as above.   CKD-3: Stable   Severe protein calorie malnutrition: Dietitian was following.  Needs assistance with  meals. Nutrition Problem: Severe Malnutrition Etiology: chronic illness   DVT prophylaxis:  Not indicated       Code Status: Limited: Do not attempt resuscitation (DNR) -DNR-LIMITED -Do Not Intubate/DNI  Family Communication:  None at the bedside Status is: Inpatient Remains inpatient appropriate because:on comfort care       Subjective: No acute overnight events Patient does not communicate   Examination:   General exam: Non-verbal, doesn't appear to be in distress Respiratory system: Clear to auscultation. Respiratory effort normal. Cardiovascular system: S1 & S2 heard, RRR. No JVD, murmurs, rubs, gallops or clicks. No pedal edema. Gastrointestinal system: Abdomen is nondistended, soft and nontender. No organomegaly or masses felt. Normal bowel sounds heard. Extremities: Unable to assess motor/sensory systems    Vitals:   12/10/23 0110 12/13/23 0021 12/15/23 0522 12/24/23 2237  BP: 101/63 122/67 95/73 118/78  Pulse: 81 70 (!) 56 (!) 58  Resp: 16 17 16 16   Temp:    97.7 F (36.5 C)  TempSrc:   Axillary Oral  SpO2: 98% 100% 100% 100%  Height:       Author: Drue ONEIDA Potter, MD 12/25/2023 4:25 PM  For on call review [email or web address].

## 2023-12-25 NOTE — Plan of Care (Signed)
  Problem: Education: Goal: Knowledge of the prescribed therapeutic regimen will improve Outcome: Progressing   Problem: Coping: Goal: Ability to identify and develop effective coping behavior will improve Outcome: Progressing   Problem: Clinical Measurements: Goal: Quality of life will improve Outcome: Progressing   Problem: Respiratory: Goal: Verbalizations of increased ease of respirations will increase Outcome: Progressing   Problem: Role Relationship: Goal: Family's ability to cope with current situation will improve Outcome: Progressing Goal: Ability to verbalize concerns, feelings, and thoughts to partner or family member will improve Outcome: Progressing   Problem: Pain Management: Goal: Satisfaction with pain management regimen will improve Outcome: Progressing   Problem: Safety: Goal: Non-violent Restraint(s) Outcome: Progressing

## 2023-12-25 NOTE — Plan of Care (Signed)
  Problem: Education: Goal: Knowledge of the prescribed therapeutic regimen will improve Outcome: Progressing   Problem: Role Relationship: Goal: Family's ability to cope with current situation will improve Outcome: Progressing Goal: Ability to verbalize concerns, feelings, and thoughts to partner or family member will improve Outcome: Progressing

## 2023-12-25 NOTE — Plan of Care (Signed)
  Problem: Education: Goal: Knowledge of the prescribed therapeutic regimen will improve Outcome: Not Progressing   Problem: Coping: Goal: Ability to identify and develop effective coping behavior will improve Outcome: Not Progressing   Problem: Clinical Measurements: Goal: Quality of life will improve Outcome: Not Progressing   Problem: Respiratory: Goal: Verbalizations of increased ease of respirations will increase Outcome: Not Progressing   Problem: Role Relationship: Goal: Family's ability to cope with current situation will improve Outcome: Not Progressing Goal: Ability to verbalize concerns, feelings, and thoughts to partner or family member will improve Outcome: Not Progressing   Problem: Pain Management: Goal: Satisfaction with pain management regimen will improve Outcome: Not Progressing   Problem: Safety: Goal: Non-violent Restraint(s) Outcome: Not Progressing

## 2023-12-25 NOTE — Progress Notes (Signed)
 Pt mom in to see pt today and verbalizes frustration with medication changes. Mom feels she is anxious/ agitated all day and the medication is ineffective. Dr Dorinda contacted and asked to come by and speak to mom as well as review medication changes. MD verbalized he would come by to speak with mom shortly.

## 2023-12-26 NOTE — Progress Notes (Signed)
 PROGRESS NOTE    ALAYZA PIEPER  FMW:[id number] DOB: 1965/07/03 DOA: 01/22/2023 PCP: [name], NP     Brief Narrative:  Natalie Brown is a 58 year old female with PMH of bipolar disorder, early onset dementia who was initially admitted for failure to thrive . Patient has had a long and complicated hospitalization spanning 2 years. She was admitted in 2023 with increasingly erratic behavior and cognitive impairment. She was evaluated by psychiatry and neurology but no treatable or reversible cause of her impairment was found. Her hospital course was complicated by weight loss, malnutrition, failure to thrive . She was briefly placed in a memory care unit in October 2024. Upon arriving at the facility, patient exhibited agitation so patient was denied entry and sent back to the hospital the same day. Patient has remained in the hospital since then. Patient is nonverbal and does not follow any commands. Patient can ambulate with assistance. Currently, the goal of care is comfort care.   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 Patient is mostly nonverbal and does not follow any commands.  She ambulates with assistance. Mostly kept in safety cage through out the day since she is not redirectable. -Dced scheduled ativan  on 8/28. She was having agitation and restlessness so started oral liquid lorazepam  on 8/30.  -Valium  and trazodone  as needed   Full comfort care -Symptomatic management as above.   CKD-3: Stable   Severe protein calorie malnutrition: Dietitian was  following.  Needs assistance with meals. Nutrition Problem: Severe Malnutrition Etiology: chronic illness   DVT prophylaxis: Natalie Brown Code Status: DNR Family Communication: Natalie Brown Disposition Plan: Comfort care Status is: Inpatient Remains inpatient appropriate because: Comfort care    Antimicrobials:  Anti-infectives (From admission, onward)    Natalie Brown        Objective: Vitals:   12/10/23 0110 12/13/23 0021 12/15/23 0522 12/24/23 2237  BP: 101/63 122/67 95/73 118/78  Pulse: 81 70 (!) 56 (!) 58  Resp: 16 17 16 16   Temp:    97.7 F (36.5 C)  TempSrc:   Axillary Oral  SpO2: 98% 100% 100% 100%  Height:       No intake or output data in the 24 hours ending 12/26/23 1258 Filed Weights     Data Reviewed: I have personally reviewed following labs and imaging studies  CBC: No results for input(s): WBC, NEUTROABS, HGB, HCT, MCV, PLT in the last 168 hours. Basic Metabolic Panel: No results for input(s): NA, K, CL, CO2, GLUCOSE, BUN, CREATININE, CALCIUM , MG, PHOS in the last 168 hours. GFR: CrCl cannot be calculated (Patient's most recent lab result is older than the maximum 21 days allowed.). Liver Function Tests: No results for input(s): AST, ALT, ALKPHOS, BILITOT, PROT, ALBUMIN  in the last 168 hours. No results for input(s): LIPASE, AMYLASE in the last 168 hours. No results for input(s): AMMONIA in the last 168 hours. Coagulation Profile: No results for input(s): INR, PROTIME in the last 168 hours. Cardiac Enzymes: No results for input(s): CKTOTAL, CKMB, CKMBINDEX, TROPONINI in the last 168 hours. BNP (last 3 results) No results for input(s): PROBNP in  the last 8760 hours. HbA1C: No results for input(s): HGBA1C in the last 72 hours. CBG: No results for input(s): GLUCAP in the last 168 hours. Lipid Profile: No results for input(s): CHOL, HDL, LDLCALC, TRIG, CHOLHDL, LDLDIRECT in the last 72  hours. Thyroid Function Tests: No results for input(s): TSH, T4TOTAL, FREET4, T3FREE, THYROIDAB in the last 72 hours. Anemia Panel: No results for input(s): VITAMINB12, FOLATE, FERRITIN, TIBC, IRON, RETICCTPCT in the last 72 hours. Sepsis Labs: No results for input(s): PROCALCITON, LATICACIDVEN in the last 168 hours.  No results found for this or any previous visit (from the past 240 hours).    Radiology Studies: No results found.    Scheduled Meds:  feeding supplement  237 mL Oral BID BM   LORazepam   2 mg Oral TID   Continuous Infusions:   LOS: 288 days   Time spent: 10 minutes   Delon Hoe, DO [facility] 12/26/2023, 12:58 PM   Available via Epic secure chat 7am-7pm After these hours, please refer to coverage provider listed on [email or web address]

## 2023-12-26 NOTE — TOC Progression Note (Signed)
 Transition of Care [facility]) - Progression Note    Patient Details  Name: Natalie Brown MRN: [id number] Date of Birth: May 14, 1965  [facility] CM/SW Contact  Anyssa Sharpless A [place], LCSW Phone Number: 12/26/2023, 4:18 PM  Clinical Narrative:     Barriers to discharge remain the same. No options for placement. CSW continuing to follow pt.   Expected Discharge Plan: Skilled Nursing Facility Barriers to Discharge: SNF Pending bed offer, Insurance Authorization               Expected Discharge Plan and Services In-house Referral: Clinical Social Work   Post Acute Care Choice: Skilled Nursing Facility Living arrangements for the past 2 months: Assisted Living Facility                                       Social Drivers of Health (SDOH) Interventions SDOH Screenings   Food Insecurity: No Food Insecurity (05/14/2023)  Housing: Low Risk  (01/24/2023)  Transportation Needs: No Transportation Needs (05/14/2023)  Utilities: Not At Risk (05/14/2023)  Alcohol Screen: Low Risk  (09/06/2021)  Depression (PHQ2-9): Medium Risk (05/26/2020)  Tobacco Use: High Risk (04/04/2023)    Readmission Risk Interventions     No data to display

## 2023-12-27 NOTE — Plan of Care (Signed)
  Problem: Education: Goal: Knowledge of the prescribed therapeutic regimen will improve Outcome: Not Progressing   Problem: Safety: Goal: Non-violent Restraint(s) Outcome: Not Progressing

## 2023-12-27 NOTE — Progress Notes (Signed)
 PROGRESS NOTE    Natalie Brown  FMW:[id number] DOB: February 10, 1966 DOA: 01/22/2023 PCP: [name], NP     Brief Narrative:  Natalie Brown is a 58 year old female with PMH of bipolar disorder, early onset dementia who was initially admitted for failure to thrive . Patient has had a long and complicated hospitalization spanning 2 years. She was admitted in 2023 with increasingly erratic behavior and cognitive impairment. She was evaluated by psychiatry and neurology but no treatable or reversible cause of her impairment was found. Her hospital course was complicated by weight loss, malnutrition, failure to thrive . She was briefly placed in a memory care unit in October 2024. Upon arriving at the facility, patient exhibited agitation so patient was denied entry and sent back to the hospital the same day. Patient has remained in the hospital since then. Patient is nonverbal and does not follow any commands. Patient can ambulate with assistance. Currently, the goal of care is comfort care.   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 Patient is mostly nonverbal and does not follow any commands.  She ambulates with assistance. Mostly kept in safety cage through out the day since she is not redirectable. -Dced scheduled ativan  on 8/28. She was having agitation and restlessness so started oral liquid lorazepam  on 8/30.  -Valium  and trazodone  as needed   Full comfort care -Symptomatic management as above.   CKD-3: Stable   Severe protein calorie malnutrition: Dietitian was  following.  Needs assistance with meals. Nutrition Problem: Severe Malnutrition Etiology: chronic illness   DVT prophylaxis: None Code Status: DNR Family Communication: None Disposition Plan: Comfort care Status is: Inpatient Remains inpatient appropriate because: Comfort care    Antimicrobials:  Anti-infectives (From admission, onward)    None        Objective: Vitals:   12/10/23 0110 12/13/23 0021 12/15/23 0522 12/24/23 2237  BP: 101/63 122/67 95/73 118/78  Pulse: 81 70 (!) 56 (!) 58  Resp: 16 17 16 16   Temp:    97.7 F (36.5 C)  TempSrc:   Axillary Oral  SpO2: 98% 100% 100% 100%  Height:        Intake/Output Summary (Last 24 hours) at 12/27/2023 1048 Last data filed at 12/27/2023 0943 Gross per 24 hour  Intake 118 ml  Output --  Net 118 ml   Filed Weights     Data Reviewed: I have personally reviewed following labs and imaging studies  CBC: No results for input(s): WBC, NEUTROABS, HGB, HCT, MCV, PLT in the last 168 hours. Basic Metabolic Panel: No results for input(s): NA, K, CL, CO2, GLUCOSE, BUN, CREATININE, CALCIUM , MG, PHOS in the last 168 hours. GFR: CrCl cannot be calculated (Patient's most recent lab result is older than the maximum 21 days allowed.). Liver Function Tests: No results for input(s): AST, ALT, ALKPHOS, BILITOT, PROT, ALBUMIN  in the last 168 hours. No results for input(s): LIPASE, AMYLASE in the last 168 hours. No results for input(s): AMMONIA in the last 168 hours. Coagulation Profile: No results for input(s): INR, PROTIME in the last 168 hours. Cardiac Enzymes: No results for  input(s): CKTOTAL, CKMB, CKMBINDEX, TROPONINI in the last 168 hours. BNP (last 3 results) No results for input(s): PROBNP in the last 8760 hours. HbA1C: No results for input(s): HGBA1C in the last 72 hours. CBG: No results for input(s): GLUCAP in the last 168 hours. Lipid Profile: No  results for input(s): CHOL, HDL, LDLCALC, TRIG, CHOLHDL, LDLDIRECT in the last 72 hours. Thyroid Function Tests: No results for input(s): TSH, T4TOTAL, FREET4, T3FREE, THYROIDAB in the last 72 hours. Anemia Panel: No results for input(s): VITAMINB12, FOLATE, FERRITIN, TIBC, IRON, RETICCTPCT in the last 72 hours. Sepsis Labs: No results for input(s): PROCALCITON, LATICACIDVEN in the last 168 hours.  No results found for this or any previous visit (from the past 240 hours).    Radiology Studies: No results found.    Scheduled Meds:  feeding supplement  237 mL Oral BID BM   LORazepam   2 mg Oral TID   Continuous Infusions:   LOS: 289 days   Time spent: 10 minutes   Delon Hoe, DO [facility] 12/27/2023, 10:48 AM   Available via Epic secure chat 7am-7pm After these hours, please refer to coverage provider listed on [email or web address]

## 2023-12-28 NOTE — Progress Notes (Signed)
 PROGRESS NOTE    Natalie Brown  FMW:[id number] DOB: July 11, 1965 DOA: 01/22/2023 PCP: [name], NP     Brief Narrative:  Natalie Brown is a 58 year old female with PMH of bipolar disorder, early onset dementia who was initially admitted for failure to thrive . Patient has had a long and complicated hospitalization spanning 2 years. She was admitted in 2023 with increasingly erratic behavior and cognitive impairment. She was evaluated by psychiatry and neurology but no treatable or reversible cause of her impairment was found. Her hospital course was complicated by weight loss, malnutrition, failure to thrive . She was briefly placed in a memory care unit in October 2024. Upon arriving at the facility, patient exhibited agitation so patient was denied entry and sent back to the hospital the same day. Patient has remained in the hospital since then. Patient is nonverbal and does not follow any commands. Patient can ambulate with assistance. Currently, the goal of care is comfort care.   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 Patient is mostly nonverbal and does not follow any commands.  She ambulates with assistance. Mostly kept in safety cage through out the day since she is not redirectable. -Dced scheduled ativan  on 8/28. She was having agitation and restlessness so started oral liquid lorazepam  on 8/30.  -Valium  and trazodone  as needed   Full comfort care -Symptomatic management as above.   CKD-3: Stable   Severe protein calorie malnutrition: Dietitian was following.   Needs assistance with meals. Nutrition Problem: Severe Malnutrition Etiology: chronic illness   DVT prophylaxis: None Code Status: DNR Family Communication: None Disposition Plan: Comfort care Status is: Inpatient Remains inpatient appropriate because: Comfort care    Antimicrobials:  Anti-infectives (From admission, onward)    None        Objective: Vitals:   12/13/23 0021 12/15/23 0522 12/24/23 2237 12/28/23 0447  BP: 122/67 95/73 118/78 137/71  Pulse: 70 (!) 56 (!) 58 61  Resp: 17 16 16 20   Temp:   97.7 F (36.5 C)   TempSrc:  Axillary Oral   SpO2: 100% 100% 100%   Height:        Intake/Output Summary (Last 24 hours) at 12/28/2023 0936 Last data filed at 12/28/2023 0030 Gross per 24 hour  Intake 594 ml  Output --  Net 594 ml   Filed Weights   Examination: General exam: Sleeping   Data Reviewed: I have personally reviewed following labs and imaging studies  CBC: No results for input(s): WBC, NEUTROABS, HGB, HCT, MCV, PLT in the last 168 hours. Basic Metabolic Panel: No results for input(s): NA, K, CL, CO2, GLUCOSE, BUN, CREATININE, CALCIUM , MG, PHOS in the last 168 hours. GFR: CrCl cannot be calculated (Patient's most recent lab result is older than the maximum 21 days allowed.). Liver Function Tests: No results for input(s): AST, ALT, ALKPHOS, BILITOT, PROT, ALBUMIN  in the last 168 hours. No results for input(s): LIPASE, AMYLASE in the last 168 hours. No results for input(s): AMMONIA in the last 168 hours. Coagulation Profile: No results for input(s): INR, PROTIME in the last 168 hours. Cardiac  Enzymes: No results for input(s): CKTOTAL, CKMB, CKMBINDEX, TROPONINI in the last 168 hours. BNP (last 3 results) No results for input(s): PROBNP in the last 8760 hours. HbA1C: No results for input(s): HGBA1C in the last 72 hours. CBG: No results for input(s): GLUCAP in the last 168  hours. Lipid Profile: No results for input(s): CHOL, HDL, LDLCALC, TRIG, CHOLHDL, LDLDIRECT in the last 72 hours. Thyroid Function Tests: No results for input(s): TSH, T4TOTAL, FREET4, T3FREE, THYROIDAB in the last 72 hours. Anemia Panel: No results for input(s): VITAMINB12, FOLATE, FERRITIN, TIBC, IRON, RETICCTPCT in the last 72 hours. Sepsis Labs: No results for input(s): PROCALCITON, LATICACIDVEN in the last 168 hours.  No results found for this or any previous visit (from the past 240 hours).    Radiology Studies: No results found.    Scheduled Meds:  feeding supplement  237 mL Oral BID BM   LORazepam   2 mg Oral TID   Continuous Infusions:   LOS: 290 days   Time spent: 10 minutes   Delon Hoe, DO [facility] 12/28/2023, 9:36 AM   Available via Epic secure chat 7am-7pm After these hours, please refer to coverage provider listed on [email or web address]

## 2023-12-28 NOTE — Plan of Care (Signed)
  Problem: Education: Goal: Knowledge of the prescribed therapeutic regimen will improve Outcome: Progressing   Problem: Coping: Goal: Ability to identify and develop effective coping behavior will improve Outcome: Progressing   Problem: Clinical Measurements: Goal: Quality of life will improve Outcome: Progressing   Problem: Respiratory: Goal: Verbalizations of increased ease of respirations will increase Outcome: Progressing   Problem: Role Relationship: Goal: Family's ability to cope with current situation will improve Outcome: Progressing Goal: Ability to verbalize concerns, feelings, and thoughts to partner or family member will improve Outcome: Progressing   Problem: Pain Management: Goal: Satisfaction with pain management regimen will improve Outcome: Progressing   Problem: Safety: Goal: Non-violent Restraint(s) Outcome: Progressing

## 2023-12-28 NOTE — Plan of Care (Signed)
  Problem: Role Relationship: Goal: Family's ability to cope with current situation will improve Outcome: Progressing   Problem: Pain Management: Goal: Satisfaction with pain management regimen will improve Outcome: Progressing   

## 2023-12-29 NOTE — Progress Notes (Signed)
 PROGRESS NOTE    ARCHANA ECKMAN  FMW:[id number] DOB: Jul 03, 1965 DOA: 01/22/2023 PCP: [name], NP     Brief Narrative:  Natalie Brown is a 58 year old female with PMH of bipolar disorder, early onset dementia who was initially admitted for failure to thrive . Patient has had a long and complicated hospitalization spanning 2 years. She was admitted in 2023 with increasingly erratic behavior and cognitive impairment. She was evaluated by psychiatry and neurology but no treatable or reversible cause of her impairment was found. Her hospital course was complicated by weight loss, malnutrition, failure to thrive . She was briefly placed in a memory care unit in October 2024. Upon arriving at the facility, patient exhibited agitation so patient was denied entry and sent back to the hospital the same day. Patient has remained in the hospital since then. Patient is nonverbal and does not follow any commands. Patient can ambulate with assistance. Currently, the goal of care is comfort care.   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 Patient is mostly nonverbal and does not follow any commands.  She ambulates with assistance. Mostly kept in safety cage through out the day since she is not redirectable. -Dced scheduled ativan  on 8/28. She was having agitation and restlessness so started oral liquid lorazepam  on 8/30.  -Valium  and trazodone  as needed   Full comfort care -Symptomatic management as above.   CKD-3: Stable   Severe protein calorie malnutrition: Dietitian was following.   Needs assistance with meals. Nutrition Problem: Severe Malnutrition Etiology: chronic illness   DVT prophylaxis: None Code Status: DNR Family Communication: None Disposition Plan: Comfort care Status is: Inpatient Remains inpatient appropriate because: Comfort care    Antimicrobials:  Anti-infectives (From admission, onward)    None        Objective: Vitals:   12/13/23 0021 12/15/23 0522 12/24/23 2237 12/28/23 0447  BP: 122/67 95/73 118/78 137/71  Pulse: 70 (!) 56 (!) 58 61  Resp: 17 16 16 20   Temp:   97.7 F (36.5 C)   TempSrc:  Axillary Oral   SpO2: 100% 100% 100%   Height:        Intake/Output Summary (Last 24 hours) at 12/29/2023 1043 Last data filed at 12/28/2023 1300 Gross per 24 hour  Intake 88 ml  Output --  Net 88 ml   Filed Weights   Examination: General exam: Sleeping   Data Reviewed: I have personally reviewed following labs and imaging studies  CBC: No results for input(s): WBC, NEUTROABS, HGB, HCT, MCV, PLT in the last 168 hours. Basic Metabolic Panel: No results for input(s): NA, K, CL, CO2, GLUCOSE, BUN, CREATININE, CALCIUM , MG, PHOS in the last 168 hours. GFR: CrCl cannot be calculated (Patient's most recent lab result is older than the maximum 21 days allowed.). Liver Function Tests: No results for input(s): AST, ALT, ALKPHOS, BILITOT, PROT, ALBUMIN  in the last 168 hours. No results for input(s): LIPASE, AMYLASE in the last 168 hours. No results for input(s): AMMONIA in the last 168 hours. Coagulation Profile: No results for input(s): INR, PROTIME in the last 168 hours. Cardiac  Enzymes: No results for input(s): CKTOTAL, CKMB, CKMBINDEX, TROPONINI in the last 168 hours. BNP (last 3 results) No results for input(s): PROBNP in the last 8760 hours. HbA1C: No results for input(s): HGBA1C in the last 72 hours. CBG: No results for input(s): GLUCAP in the last 168  hours. Lipid Profile: No results for input(s): CHOL, HDL, LDLCALC, TRIG, CHOLHDL, LDLDIRECT in the last 72 hours. Thyroid Function Tests: No results for input(s): TSH, T4TOTAL, FREET4, T3FREE, THYROIDAB in the last 72 hours. Anemia Panel: No results for input(s): VITAMINB12, FOLATE, FERRITIN, TIBC, IRON, RETICCTPCT in the last 72 hours. Sepsis Labs: No results for input(s): PROCALCITON, LATICACIDVEN in the last 168 hours.  No results found for this or any previous visit (from the past 240 hours).    Radiology Studies: No results found.    Scheduled Meds:  feeding supplement  237 mL Oral BID BM   LORazepam   2 mg Oral TID   Continuous Infusions:   LOS: 291 days   Time spent: 10 minutes   Delon Hoe, DO [facility] 12/29/2023, 10:43 AM   Available via Epic secure chat 7am-7pm After these hours, please refer to coverage provider listed on [email or web address]

## 2023-12-29 NOTE — Plan of Care (Signed)
  Problem: Pain Management: Goal: Satisfaction with pain management regimen will improve Outcome: Progressing   

## 2023-12-29 NOTE — Plan of Care (Signed)
  Problem: Education: Goal: Knowledge of the prescribed therapeutic regimen will improve Outcome: Progressing   Problem: Coping: Goal: Ability to identify and develop effective coping behavior will improve Outcome: Progressing   Problem: Clinical Measurements: Goal: Quality of life will improve Outcome: Progressing   Problem: Respiratory: Goal: Verbalizations of increased ease of respirations will increase Outcome: Progressing   Problem: Role Relationship: Goal: Family's ability to cope with current situation will improve Outcome: Progressing Goal: Ability to verbalize concerns, feelings, and thoughts to partner or family member will improve Outcome: Progressing   Problem: Pain Management: Goal: Satisfaction with pain management regimen will improve Outcome: Progressing   Problem: Safety: Goal: Non-violent Restraint(s) Outcome: Progressing

## 2023-12-30 NOTE — Plan of Care (Signed)
  Problem: Education: Goal: Knowledge of the prescribed therapeutic regimen will improve Outcome: Progressing   Problem: Coping: Goal: Ability to identify and develop effective coping behavior will improve Outcome: Progressing   Problem: Clinical Measurements: Goal: Quality of life will improve Outcome: Progressing   Problem: Respiratory: Goal: Verbalizations of increased ease of respirations will increase Outcome: Progressing   Problem: Role Relationship: Goal: Family's ability to cope with current situation will improve Outcome: Progressing Goal: Ability to verbalize concerns, feelings, and thoughts to partner or family member will improve Outcome: Progressing   Problem: Pain Management: Goal: Satisfaction with pain management regimen will improve Outcome: Progressing   Problem: Safety: Goal: Non-violent Restraint(s) Outcome: Progressing

## 2023-12-30 NOTE — Progress Notes (Signed)
 PROGRESS NOTE    Natalie Brown  FMW:[id number] DOB: 12-Dec-1965 DOA: 01/22/2023 PCP: [name], NP     Brief Narrative:  Natalie Brown is a 58 year old female with PMH of bipolar disorder, early onset dementia who was initially admitted for failure to thrive . Patient has had a long and complicated hospitalization spanning 2 years. She was admitted in 2023 with increasingly erratic behavior and cognitive impairment. She was evaluated by psychiatry and neurology but no treatable or reversible cause of her impairment was found. Her hospital course was complicated by weight loss, malnutrition, failure to thrive . She was briefly placed in a memory care unit in October 2024. Upon arriving at the facility, patient exhibited agitation so patient was denied entry and sent back to the hospital the same day. Patient has remained in the hospital since then. Patient is nonverbal and does not follow any commands. Patient can ambulate with assistance. Currently, the goal of care is comfort care.   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 Patient is mostly nonverbal and does not follow any commands.  She ambulates with assistance. Mostly kept in safety cage through out the day since she is not redirectable. -Dced scheduled ativan  on 8/28. She was having agitation and restlessness so started oral liquid lorazepam  on 8/30.  -Valium  and trazodone  as needed   Full comfort care -Symptomatic management as above.   CKD-3: Stable   Severe protein calorie malnutrition: Dietitian was following.   Needs assistance with meals. Nutrition Problem: Severe Malnutrition Etiology: chronic illness   DVT prophylaxis: None Code Status: DNR Family Communication: None Disposition Plan: Comfort care Status is: Inpatient Remains inpatient appropriate because: Comfort care    Antimicrobials:  Anti-infectives (From admission, onward)    None        Objective: Vitals:   12/15/23 0522 12/24/23 2237 12/28/23 0447 12/29/23 2024  BP: 95/73 118/78 137/71 108/65  Pulse: (!) 56 (!) 58 61 96  Resp: 16 16 20 18   Temp:  97.7 F (36.5 C)  98 F (36.7 C)  TempSrc: Axillary Oral    SpO2: 100% 100%  98%  Height:       No intake or output data in the 24 hours ending 12/30/23 0924  Filed Weights   Examination: General exam: Comfortable and calm    Data Reviewed: I have personally reviewed following labs and imaging studies  CBC: No results for input(s): WBC, NEUTROABS, HGB, HCT, MCV, PLT in the last 168 hours. Basic Metabolic Panel: No results for input(s): NA, K, CL, CO2, GLUCOSE, BUN, CREATININE, CALCIUM , MG, PHOS in the last 168 hours. GFR: CrCl cannot be calculated (Patient's most recent lab result is older than the maximum 21 days allowed.). Liver Function Tests: No results for input(s): AST, ALT, ALKPHOS, BILITOT, PROT, ALBUMIN  in the last 168 hours. No results for input(s): LIPASE, AMYLASE in the last 168 hours. No results for input(s): AMMONIA in the last 168 hours. Coagulation Profile: No results for input(s): INR, PROTIME in the last 168 hours. Cardiac Enzymes: No results for input(s): CKTOTAL, CKMB, CKMBINDEX, TROPONINI in the last 168  hours. BNP (last 3 results) No results for input(s): PROBNP in the last 8760 hours. HbA1C: No results for input(s): HGBA1C in the last 72 hours. CBG: No results for input(s): GLUCAP in the last 168 hours. Lipid Profile: No results for input(s): CHOL, HDL,  LDLCALC, TRIG, CHOLHDL, LDLDIRECT in the last 72 hours. Thyroid Function Tests: No results for input(s): TSH, T4TOTAL, FREET4, T3FREE, THYROIDAB in the last 72 hours. Anemia Panel: No results for input(s): VITAMINB12, FOLATE, FERRITIN, TIBC, IRON, RETICCTPCT in the last 72 hours. Sepsis Labs: No results for input(s): PROCALCITON, LATICACIDVEN in the last 168 hours.  No results found for this or any previous visit (from the past 240 hours).    Radiology Studies: No results found.    Scheduled Meds:  feeding supplement  237 mL Oral BID BM   LORazepam   2 mg Oral TID   Continuous Infusions:   LOS: 292 days   Time spent: 10 minutes   Delon Hoe, DO [facility] 12/30/2023, 9:24 AM   Available via Epic secure chat 7am-7pm After these hours, please refer to coverage provider listed on [email or web address]

## 2023-12-31 NOTE — Plan of Care (Signed)
  Problem: Clinical Measurements: Goal: Quality of life will improve Outcome: Progressing   Problem: Role Relationship: Goal: Family's ability to cope with current situation will improve Outcome: Progressing   Problem: Pain Management: Goal: Satisfaction with pain management regimen will improve Outcome: Progressing   Problem: Education: Goal: Knowledge of the prescribed therapeutic regimen will improve Outcome: Not Progressing   Problem: Coping: Goal: Ability to identify and develop effective coping behavior will improve Outcome: Not Progressing   Problem: Respiratory: Goal: Verbalizations of increased ease of respirations will increase Outcome: Not Progressing   Problem: Role Relationship: Goal: Ability to verbalize concerns, feelings, and thoughts to partner or family member will improve Outcome: Not Progressing   Problem: Safety: Goal: Non-violent Restraint(s) Outcome: Not Progressing

## 2023-12-31 NOTE — Plan of Care (Signed)
  Problem: Education: Goal: Knowledge of the prescribed therapeutic regimen will improve 12/31/2023 1711 by Kip Rocky BRAVO, RN Outcome: Not Progressing 12/31/2023 1711 by Kip Rocky BRAVO, RN Outcome: Progressing   Problem: Coping: Goal: Ability to identify and develop effective coping behavior will improve 12/31/2023 1711 by Kip Rocky BRAVO, RN Outcome: Not Progressing 12/31/2023 1711 by Kip Rocky BRAVO, RN Outcome: Progressing   Problem: Clinical Measurements: Goal: Quality of life will improve 12/31/2023 1711 by Kip Rocky BRAVO, RN Outcome: Not Progressing 12/31/2023 1711 by Kip Rocky BRAVO, RN Outcome: Progressing   Problem: Respiratory: Goal: Verbalizations of increased ease of respirations will increase 12/31/2023 1711 by Kip Rocky BRAVO, RN Outcome: Not Progressing 12/31/2023 1711 by Kip Rocky BRAVO, RN Outcome: Progressing   Problem: Safety: Goal: Non-violent Restraint(s) 12/31/2023 1711 by Kip Rocky BRAVO, RN Outcome: Not Progressing 12/31/2023 1711 by Kip Rocky BRAVO, RN Outcome: Progressing

## 2023-12-31 NOTE — Progress Notes (Signed)
 [facility] PROGRESS NOTE    Progress Note  Natalie Brown  FMW:[id number] DOB: 07/13/1965 DOA: 01/22/2023 PCP: [name], NP     Brief Narrative:   Natalie Brown is an 58 y.o. female history significant for early onset dementia, bipolar disorder, catatonia essential hypertension sinus tachycardia B12 deficiency severe protein caloric malnutrition he was in the hospital for more than 400 days discharged on 01/22/2023 only to come back to the ED after 49 days due to agitation.  While in the ED evaluated by psychiatry who made changes to his medication.  Patient is currently a DNR/DNI she was evaluated by psych and neurology with no reversible treatable causes.  Her hospital course has been complicated by renal's malnutrition failure to thrive .  She was recently placed to memory care unit on October 2024 upon arrival the patient examined agitation so was denied entry and sent back to the hospital.  Patient has remained hospitalized since then, she is nonverbal does not follow commands can ambulate with assistance.  Palliative care is also on board.   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 Continue Zyprexa  Ativan  hydroxyzine  and Haldol  as needed. Palliative care is following she is currently a DNR/DNI. Patient unlikely to improve back to baseline per neurology. Further management per psychiatry. Patient was sent to a memory care unit and sent back due to restlessness. Valium  and trazodone  as needed. Now she is full comfort.  Full comfort care: Continue symptomatic management.  Hypovolemic hypernatremia: Noted.  Weight loss/severe protein caloric malnutrition.: Continue diet and iron supplementation.  Sinus tachycardia Likely due to agitation and improved plus minus dehydration. Now improved.  Essential hypertension Well-controlled off antihypertensive medication  continue to monitor.     DVT prophylaxis: lovenox  Family Communication:none Status is: Inpatient Remains inpatient appropriate because: Acute agitation.    Code Status:     Code Status Orders  (From admission, onward)           Start     Ordered   03/13/23 1329  Do not attempt resuscitation (DNR)- Limited -Do Not Intubate (DNI)  Continuous       Question Answer Comment  If pulseless and not breathing No CPR or chest compressions.   In Pre-Arrest Conditions (Patient Is Breathing and Has A Pulse) Do not intubate. Provide all appropriate non-invasive medical interventions. Avoid ICU transfer unless indicated or required.   Consent: Discussion documented in EHR or advanced directives reviewed      03/13/23 1330           Code Status History     Date Active Date Inactive Code Status Order ID Comments User Context   03/13/2023 1020 03/13/2023 1330 Limited: Do not attempt resuscitation (DNR) -DNR-LIMITED -Do Not Intubate/DNI  [id number]  Marcellus Cassondra PARAS, NP ED   03/12/2023 1250 03/13/2023 1020 Do not attempt resuscitation (DNR) - Comfort care [id number]  Marcellus Cassondra PARAS, NP ED   12/18/2022 1559 01/22/2023 1531 Do not attempt resuscitation (DNR) - Comfort care [id number]  Cherisse Ronal ORN, NP Inpatient   03/24/2022 1001 12/18/2022 1559 Full Code [id number]  Dennise Lavada POUR, MD Inpatient   12/24/2021 2334 12/26/2021 0159 Full Code [id number]  Waddell Rake, MD ED   12/24/2021 1935 12/24/2021 2334 Full Code [id number]  Waddell Rake, MD ED   12/24/2021 1908 12/24/2021 1935 Full Code [id number]  Waddell Rake, MD Inpatient   09/06/2021 2224 12/24/2021 1908 Full Code [id number]  Moishe Bernadette BRAVO,  NP Inpatient   09/06/2021 2224 09/06/2021 2224 Full Code [id number]  Moishe Bernadette BRAVO, NP Inpatient   09/05/2021 1528 09/06/2021 2140 Full Code [id number]  Doretha Folks, MD ED   05/26/2020 1636 05/29/2020 1818 Full Code [id number]  Judythe Lyle LABOR, NP ED         IV Access:   Peripheral IV   Procedures  and diagnostic studies:   No results found.   Medical Consultants:   None.   Subjective:    Natalie Brown nonverbal  Objective:    Vitals:   12/24/23 2237 12/28/23 0447 12/29/23 2024 12/30/23 2004  BP: 118/78 137/71 108/65 97/66  Pulse: (!) 58 61 96 64  Resp: 16 20 18 17   Temp: 97.7 F (36.5 C)  98 F (36.7 C)   TempSrc: Oral     SpO2: 100%  98% 100%  Height:       SpO2: 100 % O2 Flow Rate (L/min): 100 L/min   Intake/Output Summary (Last 24 hours) at 12/31/2023 1002 Last data filed at 12/31/2023 0937 Gross per 24 hour  Intake 118 ml  Output --  Net 118 ml   Filed Weights    Exam: General exam: In no acute distress. Respiratory system: Good air movement and clear to auscultation. Cardiovascular system: S1 & S2 heard, RRR. No JVD. Gastrointestinal system: Abdomen is nondistended, soft and nontender.  Extremities: No pedal edema. Skin: No rashes, lesions or ulcers Psychiatry: No judgment or insight of medical condition.  Data Reviewed:    Labs: Basic Metabolic Panel: No results for input(s): NA, K, CL, CO2, GLUCOSE, BUN, CREATININE, CALCIUM , MG, PHOS in the last 168 hours.  GFR CrCl cannot be calculated (Patient's most recent lab result is older than the maximum 21 days allowed.). Liver Function Tests: No results for input(s): AST, ALT, ALKPHOS, BILITOT, PROT, ALBUMIN  in the last 168 hours.  No results for input(s): LIPASE, AMYLASE in the last 168 hours. No results for input(s): AMMONIA in the last 168 hours. Coagulation profile No results for input(s): INR, PROTIME in the last 168 hours. COVID-19 Labs  No results for input(s): DDIMER, FERRITIN, LDH, CRP in the last 72 hours.  Lab Results  Component Value Date   SARSCOV2NAA NEGATIVE 01/22/2023   SARSCOV2NAA NEGATIVE 05/14/2022   SARSCOV2NAA POSITIVE (A) 01/12/2022   SARSCOV2NAA NEGATIVE 12/24/2021    CBC: No results for input(s): WBC,  NEUTROABS, HGB, HCT, MCV, PLT in the last 168 hours.  Cardiac Enzymes: No results for input(s): CKTOTAL, CKMB, CKMBINDEX, TROPONINI in the last 168 hours. BNP (last 3 results) No results for input(s): PROBNP in the last 8760 hours. CBG: No results for input(s): GLUCAP in the last 168 hours. D-Dimer: No results for input(s): DDIMER in the last 72 hours. Hgb A1c: No results for input(s): HGBA1C in the last 72 hours. Lipid Profile: No results for input(s): CHOL, HDL, LDLCALC, TRIG, CHOLHDL, LDLDIRECT in the last 72 hours. Thyroid function studies: No results for input(s): TSH, T4TOTAL, T3FREE, THYROIDAB in the last 72 hours.  Invalid input(s): FREET3 Anemia work up: No results for input(s): VITAMINB12, FOLATE, FERRITIN, TIBC, IRON, RETICCTPCT in the last 72 hours. Sepsis Labs: No results for input(s): PROCALCITON, WBC, LATICACIDVEN in the last 168 hours.  Microbiology No results found for this or any previous visit (from the past 240 hours).   Medications:    feeding supplement  237 mL Oral BID BM   LORazepam   2 mg Oral TID   Continuous Infusions:  LOS: 293 days   Erle Odell Castor  [facility]  12/31/2023, 10:02 AM

## 2024-01-01 NOTE — Plan of Care (Signed)
  Problem: Education: Goal: Knowledge of the prescribed therapeutic regimen will improve Outcome: Progressing   Problem: Coping: Goal: Ability to identify and develop effective coping behavior will improve Outcome: Progressing   Problem: Clinical Measurements: Goal: Quality of life will improve Outcome: Progressing   Problem: Respiratory: Goal: Verbalizations of increased ease of respirations will increase Outcome: Progressing   Problem: Role Relationship: Goal: Family's ability to cope with current situation will improve Outcome: Progressing Goal: Ability to verbalize concerns, feelings, and thoughts to partner or family member will improve Outcome: Progressing   Problem: Pain Management: Goal: Satisfaction with pain management regimen will improve Outcome: Progressing   Problem: Safety: Goal: Non-violent Restraint(s) Outcome: Progressing

## 2024-01-01 NOTE — Plan of Care (Signed)
  Problem: Respiratory: Goal: Verbalizations of increased ease of respirations will increase Outcome: Progressing   Problem: Pain Management: Goal: Satisfaction with pain management regimen will improve Outcome: Progressing   Problem: Education: Goal: Knowledge of the prescribed therapeutic regimen will improve Outcome: Not Progressing   Problem: Role Relationship: Goal: Family's ability to cope with current situation will improve Outcome: Not Progressing Goal: Ability to verbalize concerns, feelings, and thoughts to partner or family member will improve Outcome: Not Progressing   Problem: Safety: Goal: Non-violent Restraint(s) Outcome: Not Progressing

## 2024-01-01 NOTE — Progress Notes (Signed)
 [facility] PROGRESS NOTE    Progress Note  Natalie Brown  FMW:[id number] DOB: 12-28-1965 DOA: 01/22/2023 PCP: [name], NP     Brief Narrative:   Natalie Brown is an 58 y.o. female history significant for early onset dementia, bipolar disorder, catatonia essential hypertension sinus tachycardia B12 deficiency severe protein caloric malnutrition he was in the hospital for more than 400 days discharged on 01/22/2023 only to come back to the ED after 49 days due to agitation.  While in the ED evaluated by psychiatry who made changes to his medication.  Patient is currently a DNR/DNI she was evaluated by psych and neurology with no reversible treatable causes.  Her hospital course has been complicated by renal's malnutrition failure to thrive .  She was recently placed to memory care unit on October 2024 upon arrival the patient examined agitation so was denied entry and sent back to the hospital.  Patient has remained hospitalized since then, she is nonverbal does not follow commands can ambulate with assistance.  Palliative care is also on board.   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 Continue Zyprexa  Ativan  hydroxyzine  and Haldol  as needed. Palliative care is following she is currently a DNR/DNI. Patient unlikely to improve back to baseline per neurology. Further management per psychiatry. Patient was sent to a memory care unit and sent back due to restlessness. Valium  and trazodone  as needed. Now she is full comfort.  Full comfort care: Continue symptomatic management.  Hypovolemic hypernatremia: Noted.  Weight loss/severe protein caloric malnutrition.: Continue diet and iron supplementation.  Sinus tachycardia Likely due to agitation and improved plus minus dehydration. Now improved.  Essential hypertension Well-controlled off antihypertensive medication  continue to monitor.     DVT prophylaxis: lovenox  Family Communication:none Status is: Inpatient Remains inpatient appropriate because: Acute agitation.    Code Status:     Code Status Orders  (From admission, onward)           Start     Ordered   03/13/23 1329  Do not attempt resuscitation (DNR)- Limited -Do Not Intubate (DNI)  Continuous       Question Answer Comment  If pulseless and not breathing No CPR or chest compressions.   In Pre-Arrest Conditions (Patient Is Breathing and Has A Pulse) Do not intubate. Provide all appropriate non-invasive medical interventions. Avoid ICU transfer unless indicated or required.   Consent: Discussion documented in EHR or advanced directives reviewed      03/13/23 1330           Code Status History     Date Active Date Inactive Code Status Order ID Comments User Context   03/13/2023 1020 03/13/2023 1330 Limited: Do not attempt resuscitation (DNR) -DNR-LIMITED -Do Not Intubate/DNI  [id number]  Marcellus Cassondra PARAS, NP ED   03/12/2023 1250 03/13/2023 1020 Do not attempt resuscitation (DNR) - Comfort care [id number]  Marcellus Cassondra PARAS, NP ED   12/18/2022 1559 01/22/2023 1531 Do not attempt resuscitation (DNR) - Comfort care [id number]  Cherisse Ronal ORN, NP Inpatient   03/24/2022 1001 12/18/2022 1559 Full Code [id number]  Dennise Lavada POUR, MD Inpatient   12/24/2021 2334 12/26/2021 0159 Full Code [id number]  Waddell Rake, MD ED   12/24/2021 1935 12/24/2021 2334 Full Code [id number]  Waddell Rake, MD ED   12/24/2021 1908 12/24/2021 1935 Full Code [id number]  Waddell Rake, MD Inpatient   09/06/2021 2224 12/24/2021 1908 Full Code [id number]  Moishe Bernadette BRAVO,  NP Inpatient   09/06/2021 2224 09/06/2021 2224 Full Code [id number]  Moishe Bernadette BRAVO, NP Inpatient   09/05/2021 1528 09/06/2021 2140 Full Code [id number]  Doretha Folks, MD ED   05/26/2020 1636 05/29/2020 1818 Full Code [id number]  Judythe Lyle LABOR, NP ED         IV Access:   Peripheral IV   Procedures  and diagnostic studies:   No results found.   Medical Consultants:   None.   Subjective:    Natalie Brown nonverbal  Objective:    Vitals:   12/28/23 0447 12/29/23 2024 12/30/23 2004 12/31/23 2037  BP: 137/71 108/65 97/66 121/69  Pulse: 61 96 64 73  Resp: 20 18 17 20   Temp:  98 F (36.7 C)  97.7 F (36.5 C)  TempSrc:    Oral  SpO2:  98% 100% 100%  Height:       SpO2: 100 % O2 Flow Rate (L/min): 100 L/min   Intake/Output Summary (Last 24 hours) at 01/01/2024 0924 Last data filed at 12/31/2023 1834 Gross per 24 hour  Intake 483 ml  Output --  Net 483 ml   Filed Weights    Exam: General exam: In no acute distress. Respiratory system: Good air movement and clear to auscultation. Cardiovascular system: S1 & S2 heard, RRR. No JVD. Gastrointestinal system: Abdomen is nondistended, soft and nontender.  Extremities: No pedal edema. Skin: No rashes, lesions or ulcers Psychiatry: No judgment or insight of medical condition.  Data Reviewed:    Labs: Basic Metabolic Panel: No results for input(s): NA, K, CL, CO2, GLUCOSE, BUN, CREATININE, CALCIUM , MG, PHOS in the last 168 hours.  GFR CrCl cannot be calculated (Patient's most recent lab result is older than the maximum 21 days allowed.). Liver Function Tests: No results for input(s): AST, ALT, ALKPHOS, BILITOT, PROT, ALBUMIN  in the last 168 hours.  No results for input(s): LIPASE, AMYLASE in the last 168 hours. No results for input(s): AMMONIA in the last 168 hours. Coagulation profile No results for input(s): INR, PROTIME in the last 168 hours. COVID-19 Labs  No results for input(s): DDIMER, FERRITIN, LDH, CRP in the last 72 hours.  Lab Results  Component Value Date   SARSCOV2NAA NEGATIVE 01/22/2023   SARSCOV2NAA NEGATIVE 05/14/2022   SARSCOV2NAA POSITIVE (A) 01/12/2022   SARSCOV2NAA NEGATIVE 12/24/2021    CBC: No results for input(s): WBC,  NEUTROABS, HGB, HCT, MCV, PLT in the last 168 hours.  Cardiac Enzymes: No results for input(s): CKTOTAL, CKMB, CKMBINDEX, TROPONINI in the last 168 hours. BNP (last 3 results) No results for input(s): PROBNP in the last 8760 hours. CBG: No results for input(s): GLUCAP in the last 168 hours. D-Dimer: No results for input(s): DDIMER in the last 72 hours. Hgb A1c: No results for input(s): HGBA1C in the last 72 hours. Lipid Profile: No results for input(s): CHOL, HDL, LDLCALC, TRIG, CHOLHDL, LDLDIRECT in the last 72 hours. Thyroid function studies: No results for input(s): TSH, T4TOTAL, T3FREE, THYROIDAB in the last 72 hours.  Invalid input(s): FREET3 Anemia work up: No results for input(s): VITAMINB12, FOLATE, FERRITIN, TIBC, IRON, RETICCTPCT in the last 72 hours. Sepsis Labs: No results for input(s): PROCALCITON, WBC, LATICACIDVEN in the last 168 hours.  Microbiology No results found for this or any previous visit (from the past 240 hours).   Medications:    feeding supplement  237 mL Oral BID BM   LORazepam   2 mg Oral TID   Continuous Infusions:      LOS:  294 days   Natalie Brown  [facility]  01/01/2024, 9:24 AM

## 2024-01-02 NOTE — Progress Notes (Signed)
 [facility] PROGRESS NOTE    Progress Note  Natalie Brown  FMW:[id number] DOB: 05-22-1965 DOA: 01/22/2023 PCP: [name], NP     Brief Narrative:   Natalie Brown is an 58 y.o. female history significant for early onset dementia, bipolar disorder, catatonia essential hypertension sinus tachycardia B12 deficiency severe protein caloric malnutrition he was in the hospital for more than 400 days discharged on 01/22/2023 only to come back to the ED after 49 days due to agitation.  While in the ED evaluated by psychiatry who made changes to his medication.  Patient is currently a DNR/DNI she was evaluated by psych and neurology with no reversible treatable causes.  Her hospital course has been complicated by renal's malnutrition failure to thrive .  She was recently placed to memory care unit on October 2024 upon arrival the patient examined agitation so was denied entry and sent back to the hospital.  Patient has remained hospitalized since then, she is nonverbal does not follow commands can ambulate with assistance.  Palliative care is also on board.   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 Continue Zyprexa  Ativan  hydroxyzine  and Haldol  as needed. Palliative care is following she is currently a DNR/DNI. Patient unlikely to improve back to baseline per neurology. Further management per psychiatry. Patient was sent to a memory care unit and sent back due to restlessness. Valium  and trazodone  as needed. Now she is full comfort.  Full comfort care: Continue symptomatic management.  Hypovolemic hypernatremia: Noted.  Weight loss/severe protein caloric malnutrition.: Continue diet and iron supplementation.  Sinus tachycardia Likely due to agitation and improved plus minus dehydration. Now improved.  Essential hypertension Well-controlled off antihypertensive medication  continue to monitor.     DVT prophylaxis: lovenox  Family Communication:none Status is: Inpatient Remains inpatient appropriate because: Acute agitation.    Code Status:     Code Status Orders  (From admission, onward)           Start     Ordered   03/13/23 1329  Do not attempt resuscitation (DNR)- Limited -Do Not Intubate (DNI)  Continuous       Question Answer Comment  If pulseless and not breathing No CPR or chest compressions.   In Pre-Arrest Conditions (Patient Is Breathing and Has A Pulse) Do not intubate. Provide all appropriate non-invasive medical interventions. Avoid ICU transfer unless indicated or required.   Consent: Discussion documented in EHR or advanced directives reviewed      03/13/23 1330           Code Status History     Date Active Date Inactive Code Status Order ID Comments User Context   03/13/2023 1020 03/13/2023 1330 Limited: Do not attempt resuscitation (DNR) -DNR-LIMITED -Do Not Intubate/DNI  [id number]  Marcellus Cassondra PARAS, NP ED   03/12/2023 1250 03/13/2023 1020 Do not attempt resuscitation (DNR) - Comfort care [id number]  Marcellus Cassondra PARAS, NP ED   12/18/2022 1559 01/22/2023 1531 Do not attempt resuscitation (DNR) - Comfort care [id number]  Cherisse Ronal ORN, NP Inpatient   03/24/2022 1001 12/18/2022 1559 Full Code [id number]  Dennise Lavada POUR, MD Inpatient   12/24/2021 2334 12/26/2021 0159 Full Code [id number]  Waddell Rake, MD ED   12/24/2021 1935 12/24/2021 2334 Full Code [id number]  Waddell Rake, MD ED   12/24/2021 1908 12/24/2021 1935 Full Code [id number]  Waddell Rake, MD Inpatient   09/06/2021 2224 12/24/2021 1908 Full Code [id number]  Moishe Bernadette BRAVO,  NP Inpatient   09/06/2021 2224 09/06/2021 2224 Full Code [id number]  Moishe Bernadette BRAVO, NP Inpatient   09/05/2021 1528 09/06/2021 2140 Full Code [id number]  Doretha Folks, MD ED   05/26/2020 1636 05/29/2020 1818 Full Code [id number]  Judythe Lyle LABOR, NP ED         IV Access:   Peripheral IV   Procedures  and diagnostic studies:   No results found.   Medical Consultants:   None.   Subjective:    TERRIE HARING nonverbal  Objective:    Vitals:   12/28/23 0447 12/29/23 2024 12/30/23 2004 12/31/23 2037  BP: 137/71 108/65 97/66 121/69  Pulse: 61 96 64 73  Resp: 20 18 17 20   Temp:  98 F (36.7 C)  97.7 F (36.5 C)  TempSrc:    Oral  SpO2:  98% 100% 100%  Height:       SpO2: 100 % O2 Flow Rate (L/min): 100 L/min   Intake/Output Summary (Last 24 hours) at 01/02/2024 0824 Last data filed at 01/01/2024 2301 Gross per 24 hour  Intake 415 ml  Output --  Net 415 ml   Filed Weights    Exam: General exam: In no acute distress. Respiratory system: Good air movement and clear to auscultation. Cardiovascular system: S1 & S2 heard, RRR. No JVD. Gastrointestinal system: Abdomen is nondistended, soft and nontender.  Extremities: No pedal edema. Skin: No rashes, lesions or ulcers Psychiatry: No judgment or insight of medical condition.  Data Reviewed:    Labs: Basic Metabolic Panel: No results for input(s): NA, K, CL, CO2, GLUCOSE, BUN, CREATININE, CALCIUM , MG, PHOS in the last 168 hours.  GFR CrCl cannot be calculated (Patient's most recent lab result is older than the maximum 21 days allowed.). Liver Function Tests: No results for input(s): AST, ALT, ALKPHOS, BILITOT, PROT, ALBUMIN  in the last 168 hours.  No results for input(s): LIPASE, AMYLASE in the last 168 hours. No results for input(s): AMMONIA in the last 168 hours. Coagulation profile No results for input(s): INR, PROTIME in the last 168 hours. COVID-19 Labs  No results for input(s): DDIMER, FERRITIN, LDH, CRP in the last 72 hours.  Lab Results  Component Value Date   SARSCOV2NAA NEGATIVE 01/22/2023   SARSCOV2NAA NEGATIVE 05/14/2022   SARSCOV2NAA POSITIVE (A) 01/12/2022   SARSCOV2NAA NEGATIVE 12/24/2021    CBC: No results for input(s): WBC,  NEUTROABS, HGB, HCT, MCV, PLT in the last 168 hours.  Cardiac Enzymes: No results for input(s): CKTOTAL, CKMB, CKMBINDEX, TROPONINI in the last 168 hours. BNP (last 3 results) No results for input(s): PROBNP in the last 8760 hours. CBG: No results for input(s): GLUCAP in the last 168 hours. D-Dimer: No results for input(s): DDIMER in the last 72 hours. Hgb A1c: No results for input(s): HGBA1C in the last 72 hours. Lipid Profile: No results for input(s): CHOL, HDL, LDLCALC, TRIG, CHOLHDL, LDLDIRECT in the last 72 hours. Thyroid function studies: No results for input(s): TSH, T4TOTAL, T3FREE, THYROIDAB in the last 72 hours.  Invalid input(s): FREET3 Anemia work up: No results for input(s): VITAMINB12, FOLATE, FERRITIN, TIBC, IRON, RETICCTPCT in the last 72 hours. Sepsis Labs: No results for input(s): PROCALCITON, WBC, LATICACIDVEN in the last 168 hours.  Microbiology No results found for this or any previous visit (from the past 240 hours).   Medications:    feeding supplement  237 mL Oral BID BM   LORazepam   2 mg Oral TID   Continuous Infusions:      LOS:  295 days   Erle Odell Castor  [facility]  01/02/2024, 8:24 AM

## 2024-01-02 NOTE — Plan of Care (Signed)
  Problem: Coping: Goal: Ability to identify and develop effective coping behavior will improve Outcome: Progressing   Problem: Role Relationship: Goal: Family's ability to cope with current situation will improve Outcome: Progressing Goal: Ability to verbalize concerns, feelings, and thoughts to partner or family member will improve Outcome: Progressing   Problem: Pain Management: Goal: Satisfaction with pain management regimen will improve Outcome: Progressing   Problem: Safety: Goal: Non-violent Restraint(s) Outcome: Progressing

## 2024-01-02 NOTE — Plan of Care (Signed)
  Problem: Role Relationship: Goal: Family's ability to cope with current situation will improve Outcome: Progressing   Problem: Pain Management: Goal: Satisfaction with pain management regimen will improve Outcome: Progressing   Problem: Education: Goal: Knowledge of the prescribed therapeutic regimen will improve Outcome: Not Progressing   Problem: Coping: Goal: Ability to identify and develop effective coping behavior will improve Outcome: Not Progressing   Problem: Respiratory: Goal: Verbalizations of increased ease of respirations will increase Outcome: Not Progressing   Problem: Role Relationship: Goal: Ability to verbalize concerns, feelings, and thoughts to partner or family member will improve Outcome: Not Progressing   Problem: Safety: Goal: Non-violent Restraint(s) Outcome: Not Progressing

## 2024-01-03 NOTE — Plan of Care (Signed)
  Problem: Education: Goal: Knowledge of the prescribed therapeutic regimen will improve Outcome: Progressing   Problem: Coping: Goal: Ability to identify and develop effective coping behavior will improve Outcome: Progressing   Problem: Clinical Measurements: Goal: Quality of life will improve Outcome: Progressing   Problem: Respiratory: Goal: Verbalizations of increased ease of respirations will increase Outcome: Progressing   Problem: Role Relationship: Goal: Family's ability to cope with current situation will improve Outcome: Progressing Goal: Ability to verbalize concerns, feelings, and thoughts to partner or family member will improve Outcome: Progressing   Problem: Pain Management: Goal: Satisfaction with pain management regimen will improve Outcome: Progressing   Problem: Safety: Goal: Non-violent Restraint(s) Outcome: Progressing

## 2024-01-03 NOTE — Progress Notes (Signed)
 [facility] PROGRESS NOTE    Progress Note  Natalie Brown  FMW:[id number] DOB: Jul 04, 1965 DOA: 01/22/2023 PCP: [name], NP     Brief Narrative:   Natalie Brown is an 58 y.o. female history significant for early onset dementia, bipolar disorder, catatonia essential hypertension sinus tachycardia B12 deficiency severe protein caloric malnutrition he was in the hospital for more than 400 days discharged on 01/22/2023 only to come back to the ED after 49 days due to agitation.  While in the ED evaluated by psychiatry who made changes to his medication.  Patient is currently a DNR/DNI she was evaluated by psych and neurology with no reversible treatable causes.  Her hospital course has been complicated by renal's malnutrition failure to thrive .  She was recently placed to memory care unit on October 2024 upon arrival the patient examined agitation so was denied entry and sent back to the hospital.  Patient has remained hospitalized since then, she is nonverbal does not follow commands can ambulate with assistance.  Palliative care is also on board.   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 Continue Zyprexa  Ativan  hydroxyzine  and Haldol  as needed. Palliative care is following she is currently a DNR/DNI. Patient unlikely to improve back to baseline per neurology. Further management per psychiatry. Patient was sent to a memory care unit and sent back due to restlessness. Valium  and trazodone  as needed. Now she is full comfort.  Full comfort care: Continue symptomatic management.  Hypovolemic hypernatremia: Noted.  Weight loss/severe protein caloric malnutrition.: Continue diet and iron supplementation.  Sinus tachycardia Likely due to agitation and improved plus minus dehydration. Now improved.  Essential hypertension Well-controlled off antihypertensive medication  continue to monitor.     DVT prophylaxis: lovenox  Family Communication:none Status is: Inpatient Remains inpatient appropriate because: Acute agitation.    Code Status:     Code Status Orders  (From admission, onward)           Start     Ordered   03/13/23 1329  Do not attempt resuscitation (DNR)- Limited -Do Not Intubate (DNI)  Continuous       Question Answer Comment  If pulseless and not breathing No CPR or chest compressions.   In Pre-Arrest Conditions (Patient Is Breathing and Has A Pulse) Do not intubate. Provide all appropriate non-invasive medical interventions. Avoid ICU transfer unless indicated or required.   Consent: Discussion documented in EHR or advanced directives reviewed      03/13/23 1330           Code Status History     Date Active Date Inactive Code Status Order ID Comments User Context   03/13/2023 1020 03/13/2023 1330 Limited: Do not attempt resuscitation (DNR) -DNR-LIMITED -Do Not Intubate/DNI  [id number]  Marcellus Cassondra PARAS, NP ED   03/12/2023 1250 03/13/2023 1020 Do not attempt resuscitation (DNR) - Comfort care [id number]  Marcellus Cassondra PARAS, NP ED   12/18/2022 1559 01/22/2023 1531 Do not attempt resuscitation (DNR) - Comfort care [id number]  Cherisse Ronal ORN, NP Inpatient   03/24/2022 1001 12/18/2022 1559 Full Code [id number]  Dennise Lavada POUR, MD Inpatient   12/24/2021 2334 12/26/2021 0159 Full Code [id number]  Waddell Rake, MD ED   12/24/2021 1935 12/24/2021 2334 Full Code [id number]  Waddell Rake, MD ED   12/24/2021 1908 12/24/2021 1935 Full Code [id number]  Waddell Rake, MD Inpatient   09/06/2021 2224 12/24/2021 1908 Full Code [id number]  Moishe Bernadette BRAVO,  NP Inpatient   09/06/2021 2224 09/06/2021 2224 Full Code [id number]  Moishe Bernadette BRAVO, NP Inpatient   09/05/2021 1528 09/06/2021 2140 Full Code [id number]  Doretha Folks, MD ED   05/26/2020 1636 05/29/2020 1818 Full Code [id number]  Judythe Lyle LABOR, NP ED         IV Access:   Peripheral IV   Procedures  and diagnostic studies:   No results found.   Medical Consultants:   None.   Subjective:    Natalie Brown nonverbal  Objective:    Vitals:   12/28/23 0447 12/29/23 2024 12/30/23 2004 12/31/23 2037  BP: 137/71 108/65 97/66 121/69  Pulse: 61 96 64 73  Resp: 20 18 17 20   Temp:  98 F (36.7 C)  97.7 F (36.5 C)  TempSrc:    Oral  SpO2:  98% 100% 100%  Height:       SpO2: 100 % O2 Flow Rate (L/min): 100 L/min   Intake/Output Summary (Last 24 hours) at 01/03/2024 0829 Last data filed at 01/02/2024 1855 Gross per 24 hour  Intake 150 ml  Output --  Net 150 ml   Filed Weights    Exam: General exam: In no acute distress. Respiratory system: Good air movement and clear to auscultation. Cardiovascular system: S1 & S2 heard, RRR. No JVD. Gastrointestinal system: Abdomen is nondistended, soft and nontender.  Extremities: No pedal edema. Skin: No rashes, lesions or ulcers Psychiatry: No judgment or insight of medical condition.  Data Reviewed:    Labs: Basic Metabolic Panel: No results for input(s): NA, K, CL, CO2, GLUCOSE, BUN, CREATININE, CALCIUM , MG, PHOS in the last 168 hours.  GFR CrCl cannot be calculated (Patient's most recent lab result is older than the maximum 21 days allowed.). Liver Function Tests: No results for input(s): AST, ALT, ALKPHOS, BILITOT, PROT, ALBUMIN  in the last 168 hours.  No results for input(s): LIPASE, AMYLASE in the last 168 hours. No results for input(s): AMMONIA in the last 168 hours. Coagulation profile No results for input(s): INR, PROTIME in the last 168 hours. COVID-19 Labs  No results for input(s): DDIMER, FERRITIN, LDH, CRP in the last 72 hours.  Lab Results  Component Value Date   SARSCOV2NAA NEGATIVE 01/22/2023   SARSCOV2NAA NEGATIVE 05/14/2022   SARSCOV2NAA POSITIVE (A) 01/12/2022   SARSCOV2NAA NEGATIVE 12/24/2021    CBC: No results for input(s): WBC,  NEUTROABS, HGB, HCT, MCV, PLT in the last 168 hours.  Cardiac Enzymes: No results for input(s): CKTOTAL, CKMB, CKMBINDEX, TROPONINI in the last 168 hours. BNP (last 3 results) No results for input(s): PROBNP in the last 8760 hours. CBG: No results for input(s): GLUCAP in the last 168 hours. D-Dimer: No results for input(s): DDIMER in the last 72 hours. Hgb A1c: No results for input(s): HGBA1C in the last 72 hours. Lipid Profile: No results for input(s): CHOL, HDL, LDLCALC, TRIG, CHOLHDL, LDLDIRECT in the last 72 hours. Thyroid function studies: No results for input(s): TSH, T4TOTAL, T3FREE, THYROIDAB in the last 72 hours.  Invalid input(s): FREET3 Anemia work up: No results for input(s): VITAMINB12, FOLATE, FERRITIN, TIBC, IRON, RETICCTPCT in the last 72 hours. Sepsis Labs: No results for input(s): PROCALCITON, WBC, LATICACIDVEN in the last 168 hours.  Microbiology No results found for this or any previous visit (from the past 240 hours).   Medications:    feeding supplement  237 mL Oral BID BM   LORazepam   2 mg Oral TID   Continuous Infusions:      LOS:  296 days   Natalie Brown  [facility]  01/03/2024, 8:29 AM

## 2024-01-04 NOTE — Plan of Care (Signed)
  Problem: Education: Goal: Knowledge of the prescribed therapeutic regimen will improve Outcome: Progressing   Problem: Coping: Goal: Ability to identify and develop effective coping behavior will improve Outcome: Progressing   Problem: Clinical Measurements: Goal: Quality of life will improve Outcome: Progressing   Problem: Respiratory: Goal: Verbalizations of increased ease of respirations will increase Outcome: Progressing   Problem: Role Relationship: Goal: Family's ability to cope with current situation will improve Outcome: Progressing Goal: Ability to verbalize concerns, feelings, and thoughts to partner or family member will improve Outcome: Progressing   Problem: Pain Management: Goal: Satisfaction with pain management regimen will improve Outcome: Progressing   Problem: Safety: Goal: Non-violent Restraint(s) Outcome: Progressing

## 2024-01-04 NOTE — Plan of Care (Signed)
  Problem: Safety: Goal: Non-violent Restraint(s) Outcome: Progressing   Problem: Clinical Measurements: Goal: Quality of life will improve Outcome: Progressing   Problem: Pain Management: Goal: Satisfaction with pain management regimen will improve Outcome: Progressing

## 2024-01-04 NOTE — Progress Notes (Signed)
 [facility] PROGRESS NOTE    Progress Note  Natalie Brown  FMW:[id number] DOB: Jan 18, 1966 DOA: 01/22/2023 PCP: [name], NP     Brief Narrative:   Natalie Brown is an 58 y.o. female history significant for early onset dementia, bipolar disorder, catatonia essential hypertension sinus tachycardia B12 deficiency severe protein caloric malnutrition he was in the hospital for more than 400 days discharged on 01/22/2023 only to come back to the ED after 49 days due to agitation.  While in the ED evaluated by psychiatry who made changes to his medication.  Patient is currently a DNR/DNI she was evaluated by psych and neurology with no reversible treatable causes.  Her hospital course has been complicated by renal's malnutrition failure to thrive .  She was recently placed to memory care unit on October 2024 upon arrival the patient examined agitation so was denied entry and sent back to the hospital.  Patient has remained hospitalized since then, she is nonverbal does not follow commands can ambulate with assistance.  Palliative care is also on board.   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 Continue Zyprexa  Ativan  hydroxyzine  and Haldol  as needed. Palliative care is following she is currently a DNR/DNI. Patient unlikely to improve back to baseline per neurology. Further management per psychiatry. Patient was sent to a memory care unit and sent back due to restlessness. Valium  and trazodone  as needed. Now she is full comfort.  Full comfort care: Continue symptomatic management.  Hypovolemic hypernatremia: Noted.  Weight loss/severe protein caloric malnutrition.: Continue diet and iron supplementation.  Sinus tachycardia Likely due to agitation and improved plus minus dehydration. Now improved.  Essential hypertension Well-controlled off antihypertensive medication  continue to monitor.     DVT prophylaxis: lovenox  Family Communication:none Status is: Inpatient Remains inpatient appropriate because: Acute agitation.    Code Status:     Code Status Orders  (From admission, onward)           Start     Ordered   03/13/23 1329  Do not attempt resuscitation (DNR)- Limited -Do Not Intubate (DNI)  Continuous       Question Answer Comment  If pulseless and not breathing No CPR or chest compressions.   In Pre-Arrest Conditions (Patient Is Breathing and Has A Pulse) Do not intubate. Provide all appropriate non-invasive medical interventions. Avoid ICU transfer unless indicated or required.   Consent: Discussion documented in EHR or advanced directives reviewed      03/13/23 1330           Code Status History     Date Active Date Inactive Code Status Order ID Comments User Context   03/13/2023 1020 03/13/2023 1330 Limited: Do not attempt resuscitation (DNR) -DNR-LIMITED -Do Not Intubate/DNI  [id number]  Marcellus Cassondra PARAS, NP ED   03/12/2023 1250 03/13/2023 1020 Do not attempt resuscitation (DNR) - Comfort care [id number]  Marcellus Cassondra PARAS, NP ED   12/18/2022 1559 01/22/2023 1531 Do not attempt resuscitation (DNR) - Comfort care [id number]  Cherisse Ronal ORN, NP Inpatient   03/24/2022 1001 12/18/2022 1559 Full Code [id number]  Dennise Lavada POUR, MD Inpatient   12/24/2021 2334 12/26/2021 0159 Full Code [id number]  Waddell Rake, MD ED   12/24/2021 1935 12/24/2021 2334 Full Code [id number]  Waddell Rake, MD ED   12/24/2021 1908 12/24/2021 1935 Full Code [id number]  Waddell Rake, MD Inpatient   09/06/2021 2224 12/24/2021 1908 Full Code [id number]  Moishe Bernadette BRAVO,  NP Inpatient   09/06/2021 2224 09/06/2021 2224 Full Code [id number]  Moishe Bernadette BRAVO, NP Inpatient   09/05/2021 1528 09/06/2021 2140 Full Code [id number]  Doretha Folks, MD ED   05/26/2020 1636 05/29/2020 1818 Full Code [id number]  Judythe Lyle LABOR, NP ED         IV Access:   Peripheral IV   Procedures  and diagnostic studies:   No results found.   Medical Consultants:   None.   Subjective:    Natalie Brown nonverbal  Objective:    Vitals:   12/28/23 0447 12/29/23 2024 12/30/23 2004 12/31/23 2037  BP: 137/71 108/65 97/66 121/69  Pulse: 61 96 64 73  Resp: 20 18 17 20   Temp:  98 F (36.7 C)  97.7 F (36.5 C)  TempSrc:    Oral  SpO2:  98% 100% 100%  Height:       SpO2: 100 % O2 Flow Rate (L/min): 100 L/min   Intake/Output Summary (Last 24 hours) at 01/04/2024 0835 Last data filed at 01/03/2024 1808 Gross per 24 hour  Intake 200 ml  Output --  Net 200 ml   Filed Weights    Exam: General exam: In no acute distress. Respiratory system: Good air movement and clear to auscultation. Cardiovascular system: S1 & S2 heard, RRR. No JVD. Gastrointestinal system: Abdomen is nondistended, soft and nontender.  Extremities: No pedal edema. Skin: No rashes, lesions or ulcers Psychiatry: No judgment or insight of medical condition.  Data Reviewed:    Labs: Basic Metabolic Panel: No results for input(s): NA, K, CL, CO2, GLUCOSE, BUN, CREATININE, CALCIUM , MG, PHOS in the last 168 hours.  GFR CrCl cannot be calculated (Patient's most recent lab result is older than the maximum 21 days allowed.). Liver Function Tests: No results for input(s): AST, ALT, ALKPHOS, BILITOT, PROT, ALBUMIN  in the last 168 hours.  No results for input(s): LIPASE, AMYLASE in the last 168 hours. No results for input(s): AMMONIA in the last 168 hours. Coagulation profile No results for input(s): INR, PROTIME in the last 168 hours. COVID-19 Labs  No results for input(s): DDIMER, FERRITIN, LDH, CRP in the last 72 hours.  Lab Results  Component Value Date   SARSCOV2NAA NEGATIVE 01/22/2023   SARSCOV2NAA NEGATIVE 05/14/2022   SARSCOV2NAA POSITIVE (A) 01/12/2022   SARSCOV2NAA NEGATIVE 12/24/2021    CBC: No results for input(s): WBC,  NEUTROABS, HGB, HCT, MCV, PLT in the last 168 hours.  Cardiac Enzymes: No results for input(s): CKTOTAL, CKMB, CKMBINDEX, TROPONINI in the last 168 hours. BNP (last 3 results) No results for input(s): PROBNP in the last 8760 hours. CBG: No results for input(s): GLUCAP in the last 168 hours. D-Dimer: No results for input(s): DDIMER in the last 72 hours. Hgb A1c: No results for input(s): HGBA1C in the last 72 hours. Lipid Profile: No results for input(s): CHOL, HDL, LDLCALC, TRIG, CHOLHDL, LDLDIRECT in the last 72 hours. Thyroid function studies: No results for input(s): TSH, T4TOTAL, T3FREE, THYROIDAB in the last 72 hours.  Invalid input(s): FREET3 Anemia work up: No results for input(s): VITAMINB12, FOLATE, FERRITIN, TIBC, IRON, RETICCTPCT in the last 72 hours. Sepsis Labs: No results for input(s): PROCALCITON, WBC, LATICACIDVEN in the last 168 hours.  Microbiology No results found for this or any previous visit (from the past 240 hours).   Medications:    feeding supplement  237 mL Oral BID BM   LORazepam   2 mg Oral TID   Continuous Infusions:      LOS:  297 days   Erle Odell Castor  [facility]  01/04/2024, 8:35 AM

## 2024-01-04 NOTE — Plan of Care (Signed)
  Problem: Respiratory: Goal: Verbalizations of increased ease of respirations will increase Outcome: Progressing   Problem: Pain Management: Goal: Satisfaction with pain management regimen will improve Outcome: Progressing   Problem: Education: Goal: Knowledge of the prescribed therapeutic regimen will improve Outcome: Not Progressing   Problem: Coping: Goal: Ability to identify and develop effective coping behavior will improve Outcome: Not Progressing   Problem: Clinical Measurements: Goal: Quality of life will improve Outcome: Not Progressing   Problem: Role Relationship: Goal: Family's ability to cope with current situation will improve Outcome: Not Progressing Goal: Ability to verbalize concerns, feelings, and thoughts to partner or family member will improve Outcome: Not Progressing   Problem: Safety: Goal: Non-violent Restraint(s) Outcome: Not Progressing

## 2024-01-05 NOTE — Progress Notes (Signed)
 [facility] PROGRESS NOTE    Progress Note  Natalie Brown  FMW:[id number] DOB: 1965/09/10 DOA: 01/22/2023 PCP: [name], NP     Brief Narrative:   Natalie Brown is an 58 y.o. female history significant for early onset dementia, bipolar disorder, catatonia essential hypertension sinus tachycardia B12 deficiency severe protein caloric malnutrition he was in the hospital for more than 400 days discharged on 01/22/2023 only to come back to the ED after 49 days due to agitation.  While in the ED evaluated by psychiatry who made changes to his medication.  Patient is currently a DNR/DNI she was evaluated by psych and neurology with no reversible treatable causes.  Her hospital course has been complicated by renal's malnutrition failure to thrive .  She was recently placed to memory care unit on October 2024 upon arrival the patient examined agitation so was denied entry and sent back to the hospital.  Patient has remained hospitalized since then, she is nonverbal does not follow commands can ambulate with assistance.  Palliative care is also on board.   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 Continue Zyprexa  Ativan  hydroxyzine  and Haldol  as needed. Palliative care is following she is currently a DNR/DNI. Patient unlikely to improve back to baseline per neurology. Further management per psychiatry. Patient was sent to a memory care unit and sent back due to restlessness. Valium  and trazodone  as needed. Now she is full comfort.  Full comfort care: Continue symptomatic management.  Hypovolemic hypernatremia: Noted.  Weight loss/severe protein caloric malnutrition.: Continue diet and iron supplementation.  Sinus tachycardia Likely due to agitation and improved plus minus dehydration. Now improved.  Essential hypertension Well-controlled off antihypertensive medication  continue to monitor.     DVT prophylaxis: lovenox  Family Communication:none Status is: Inpatient Remains inpatient appropriate because: Acute agitation.    Code Status:     Code Status Orders  (From admission, onward)           Start     Ordered   03/13/23 1329  Do not attempt resuscitation (DNR)- Limited -Do Not Intubate (DNI)  Continuous       Question Answer Comment  If pulseless and not breathing No CPR or chest compressions.   In Pre-Arrest Conditions (Patient Is Breathing and Has A Pulse) Do not intubate. Provide all appropriate non-invasive medical interventions. Avoid ICU transfer unless indicated or required.   Consent: Discussion documented in EHR or advanced directives reviewed      03/13/23 1330           Code Status History     Date Active Date Inactive Code Status Order ID Comments User Context   03/13/2023 1020 03/13/2023 1330 Limited: Do not attempt resuscitation (DNR) -DNR-LIMITED -Do Not Intubate/DNI  [id number]  Marcellus Cassondra PARAS, NP ED   03/12/2023 1250 03/13/2023 1020 Do not attempt resuscitation (DNR) - Comfort care [id number]  Marcellus Cassondra PARAS, NP ED   12/18/2022 1559 01/22/2023 1531 Do not attempt resuscitation (DNR) - Comfort care [id number]  Cherisse Ronal ORN, NP Inpatient   03/24/2022 1001 12/18/2022 1559 Full Code [id number]  Dennise Lavada POUR, MD Inpatient   12/24/2021 2334 12/26/2021 0159 Full Code [id number]  Waddell Rake, MD ED   12/24/2021 1935 12/24/2021 2334 Full Code [id number]  Waddell Rake, MD ED   12/24/2021 1908 12/24/2021 1935 Full Code [id number]  Waddell Rake, MD Inpatient   09/06/2021 2224 12/24/2021 1908 Full Code [id number]  Moishe Bernadette BRAVO,  NP Inpatient   09/06/2021 2224 09/06/2021 2224 Full Code [id number]  Moishe Bernadette BRAVO, NP Inpatient   09/05/2021 1528 09/06/2021 2140 Full Code [id number]  Doretha Folks, MD ED   05/26/2020 1636 05/29/2020 1818 Full Code [id number]  Judythe Lyle LABOR, NP ED         IV Access:   Peripheral IV   Procedures  and diagnostic studies:   No results found.   Medical Consultants:   None.   Subjective:    Natalie Brown nonverbal  Objective:    Vitals:   12/30/23 2004 12/31/23 2037 01/04/24 1956 01/05/24 0852  BP: 97/66 121/69 105/78 110/69  Pulse: 64 73 73   Resp: 17 20 18    Temp:   98.2 F (36.8 C)   TempSrc:  Oral    SpO2: 100% 100% 100%   Height:       SpO2: 100 % O2 Flow Rate (L/min): 100 L/min   Intake/Output Summary (Last 24 hours) at 01/05/2024 0920 Last data filed at 01/05/2024 0849 Gross per 24 hour  Intake 290 ml  Output --  Net 290 ml   Filed Weights    Exam: General exam: In no acute distress. Respiratory system: Good air movement and clear to auscultation. Cardiovascular system: S1 & S2 heard, RRR. No JVD. Gastrointestinal system: Abdomen is nondistended, soft and nontender.  Extremities: No pedal edema. Skin: No rashes, lesions or ulcers Psychiatry: No judgment or insight of medical condition.  Data Reviewed:    Labs: Basic Metabolic Panel: No results for input(s): NA, K, CL, CO2, GLUCOSE, BUN, CREATININE, CALCIUM , MG, PHOS in the last 168 hours.  GFR CrCl cannot be calculated (Patient's most recent lab result is older than the maximum 21 days allowed.). Liver Function Tests: No results for input(s): AST, ALT, ALKPHOS, BILITOT, PROT, ALBUMIN  in the last 168 hours.  No results for input(s): LIPASE, AMYLASE in the last 168 hours. No results for input(s): AMMONIA in the last 168 hours. Coagulation profile No results for input(s): INR, PROTIME in the last 168 hours. COVID-19 Labs  No results for input(s): DDIMER, FERRITIN, LDH, CRP in the last 72 hours.  Lab Results  Component Value Date   SARSCOV2NAA NEGATIVE 01/22/2023   SARSCOV2NAA NEGATIVE 05/14/2022   SARSCOV2NAA POSITIVE (A) 01/12/2022   SARSCOV2NAA NEGATIVE 12/24/2021    CBC: No results for input(s): WBC, NEUTROABS, HGB,  HCT, MCV, PLT in the last 168 hours.  Cardiac Enzymes: No results for input(s): CKTOTAL, CKMB, CKMBINDEX, TROPONINI in the last 168 hours. BNP (last 3 results) No results for input(s): PROBNP in the last 8760 hours. CBG: No results for input(s): GLUCAP in the last 168 hours. D-Dimer: No results for input(s): DDIMER in the last 72 hours. Hgb A1c: No results for input(s): HGBA1C in the last 72 hours. Lipid Profile: No results for input(s): CHOL, HDL, LDLCALC, TRIG, CHOLHDL, LDLDIRECT in the last 72 hours. Thyroid function studies: No results for input(s): TSH, T4TOTAL, T3FREE, THYROIDAB in the last 72 hours.  Invalid input(s): FREET3 Anemia work up: No results for input(s): VITAMINB12, FOLATE, FERRITIN, TIBC, IRON, RETICCTPCT in the last 72 hours. Sepsis Labs: No results for input(s): PROCALCITON, WBC, LATICACIDVEN in the last 168 hours.  Microbiology No results found for this or any previous visit (from the past 240 hours).   Medications:    feeding supplement  237 mL Oral BID BM   LORazepam   2 mg Oral TID   Continuous Infusions:      LOS: 298 days  Erle Odell Castor  [facility]  01/05/2024, 9:20 AM

## 2024-01-05 NOTE — Plan of Care (Signed)
  Problem: Safety: Goal: Non-violent Restraint(s) Outcome: Progressing   Problem: Coping: Goal: Ability to identify and develop effective coping behavior will improve Outcome: Progressing   Problem: Clinical Measurements: Goal: Quality of life will improve Outcome: Progressing   Problem: Pain Management: Goal: Satisfaction with pain management regimen will improve Outcome: Progressing

## 2024-01-06 NOTE — Progress Notes (Signed)
 [facility] PROGRESS NOTE    Progress Note  AVRI PAIVA  FMW:[id number] DOB: [birth year] DOA: 01/22/2023 PCP: [name], NP     Brief Narrative:   Natalie Brown is an 58 y.o. female history significant for early onset dementia, bipolar disorder, catatonia essential hypertension sinus tachycardia B12 deficiency severe protein caloric malnutrition he was in the hospital for more than 400 days discharged on 01/22/2023 only to come back to the ED after 49 days due to agitation.  While in the ED evaluated by psychiatry who made changes to his medication.  Patient is currently a DNR/DNI she was evaluated by psych and neurology with no reversible treatable causes.  Her hospital course has been complicated by renal's malnutrition failure to thrive .  She was recently placed to memory care unit on October 2024 upon arrival the patient examined agitation so was denied entry and sent back to the hospital.  Patient has remained hospitalized since then, she is nonverbal does not follow commands can ambulate with assistance.  Palliative care is also on board.   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 Continue Zyprexa  Ativan  hydroxyzine  and Haldol  as needed. Palliative care is following she is currently a DNR/DNI. Patient unlikely to improve back to baseline per neurology. Further management per psychiatry. Patient was sent to a memory care unit and sent back due to restlessness. Valium  and trazodone  as needed. Now she is full comfort.  Full comfort care: Continue symptomatic management.  Hypovolemic hypernatremia: Noted.  Weight loss/severe protein caloric malnutrition.: Continue diet and iron supplementation.  Sinus tachycardia Likely due to agitation and improved plus minus dehydration. Now improved.  Essential hypertension Well-controlled off antihypertensive medication  continue to monitor.     DVT prophylaxis: lovenox  Family Communication:none Status is: Inpatient Remains inpatient appropriate because: Acute agitation.    Code Status:     Code Status Orders  (From admission, onward)           Start     Ordered   03/13/23 1329  Do not attempt resuscitation (DNR)- Limited -Do Not Intubate (DNI)  Continuous       Question Answer Comment  If pulseless and not breathing No CPR or chest compressions.   In Pre-Arrest Conditions (Patient Is Breathing and Has A Pulse) Do not intubate. Provide all appropriate non-invasive medical interventions. Avoid ICU transfer unless indicated or required.   Consent: Discussion documented in EHR or advanced directives reviewed      03/13/23 1330           Code Status History     Date Active Date Inactive Code Status Order ID Comments User Context   03/13/2023 1020 03/13/2023 1330 Limited: Do not attempt resuscitation (DNR) -DNR-LIMITED -Do Not Intubate/DNI  [id number]  Marcellus Cassondra PARAS, NP ED   03/12/2023 1250 03/13/2023 1020 Do not attempt resuscitation (DNR) - Comfort care [id number]  Marcellus Cassondra PARAS, NP ED   12/18/2022 1559 01/22/2023 1531 Do not attempt resuscitation (DNR) - Comfort care [id number]  Cherisse Ronal ORN, NP Inpatient   03/24/2022 1001 12/18/2022 1559 Full Code [id number]  Dennise Lavada POUR, MD Inpatient   12/24/2021 2334 12/26/2021 0159 Full Code [id number]  Waddell Rake, MD ED   12/24/2021 1935 12/24/2021 2334 Full Code [id number]  Waddell Rake, MD ED   12/24/2021 1908 12/24/2021 1935 Full Code [id number]  Waddell Rake, MD Inpatient   09/06/2021 2224 12/24/2021 1908 Full Code [id number]  Moishe Bernadette BRAVO,  NP Inpatient   09/06/2021 2224 09/06/2021 2224 Full Code [id number]  Moishe Bernadette BRAVO, NP Inpatient   09/05/2021 1528 09/06/2021 2140 Full Code [id number]  Doretha Folks, MD ED   05/26/2020 1636 05/29/2020 1818 Full Code [id number]  Judythe Lyle LABOR, NP ED         IV Access:   Peripheral IV   Procedures  and diagnostic studies:   No results found.   Medical Consultants:   None.   Subjective:    OMUNIQUE PEDERSON nonverbal  Objective:    Vitals:   12/30/23 2004 12/31/23 2037 01/04/24 1956 01/05/24 0852  BP: 97/66 121/69 105/78 110/69  Pulse: 64 73 73   Resp: 17 20 18    Temp:   98.2 F (36.8 C)   TempSrc:  Oral    SpO2: 100% 100% 100%   Height:       SpO2: 100 % O2 Flow Rate (L/min): 100 L/min   Intake/Output Summary (Last 24 hours) at 01/06/2024 9090 Last data filed at 01/05/2024 1844 Gross per 24 hour  Intake 480 ml  Output --  Net 480 ml   Filed Weights    Exam: General exam: In no acute distress. Respiratory system: Good air movement and clear to auscultation. Cardiovascular system: S1 & S2 heard, RRR. No JVD. Gastrointestinal system: Abdomen is nondistended, soft and nontender.  Extremities: No pedal edema. Skin: No rashes, lesions or ulcers Psychiatry: No judgment or insight of medical condition.  Data Reviewed:    Labs: Basic Metabolic Panel: No results for input(s): NA, K, CL, CO2, GLUCOSE, BUN, CREATININE, CALCIUM , MG, PHOS in the last 168 hours.  GFR CrCl cannot be calculated (Patient's most recent lab result is older than the maximum 21 days allowed.). Liver Function Tests: No results for input(s): AST, ALT, ALKPHOS, BILITOT, PROT, ALBUMIN  in the last 168 hours.  No results for input(s): LIPASE, AMYLASE in the last 168 hours. No results for input(s): AMMONIA in the last 168 hours. Coagulation profile No results for input(s): INR, PROTIME in the last 168 hours. COVID-19 Labs  No results for input(s): DDIMER, FERRITIN, LDH, CRP in the last 72 hours.  Lab Results  Component Value Date   SARSCOV2NAA NEGATIVE 01/22/2023   SARSCOV2NAA NEGATIVE 05/14/2022   SARSCOV2NAA POSITIVE (A) 01/12/2022   SARSCOV2NAA NEGATIVE 12/24/2021    CBC: No results for input(s): WBC, NEUTROABS, HGB,  HCT, MCV, PLT in the last 168 hours.  Cardiac Enzymes: No results for input(s): CKTOTAL, CKMB, CKMBINDEX, TROPONINI in the last 168 hours. BNP (last 3 results) No results for input(s): PROBNP in the last 8760 hours. CBG: No results for input(s): GLUCAP in the last 168 hours. D-Dimer: No results for input(s): DDIMER in the last 72 hours. Hgb A1c: No results for input(s): HGBA1C in the last 72 hours. Lipid Profile: No results for input(s): CHOL, HDL, LDLCALC, TRIG, CHOLHDL, LDLDIRECT in the last 72 hours. Thyroid function studies: No results for input(s): TSH, T4TOTAL, T3FREE, THYROIDAB in the last 72 hours.  Invalid input(s): FREET3 Anemia work up: No results for input(s): VITAMINB12, FOLATE, FERRITIN, TIBC, IRON, RETICCTPCT in the last 72 hours. Sepsis Labs: No results for input(s): PROCALCITON, WBC, LATICACIDVEN in the last 168 hours.  Microbiology No results found for this or any previous visit (from the past 240 hours).   Medications:    feeding supplement  237 mL Oral BID BM   LORazepam   2 mg Oral TID   Continuous Infusions:      LOS: 299 days  Erle Odell Castor  [facility]  01/06/2024, 9:09 AM

## 2024-01-06 NOTE — Plan of Care (Signed)
  Problem: Education: Goal: Knowledge of the prescribed therapeutic regimen will improve Outcome: Progressing   Problem: Coping: Goal: Ability to identify and develop effective coping behavior will improve Outcome: Progressing   Problem: Clinical Measurements: Goal: Quality of life will improve Outcome: Progressing   Problem: Respiratory: Goal: Verbalizations of increased ease of respirations will increase Outcome: Progressing

## 2024-01-07 NOTE — Plan of Care (Signed)
  Problem: Education: Goal: Knowledge of the prescribed therapeutic regimen will improve Outcome: Progressing   Problem: Coping: Goal: Ability to identify and develop effective coping behavior will improve Outcome: Progressing   Problem: Clinical Measurements: Goal: Quality of life will improve Outcome: Progressing   Problem: Respiratory: Goal: Verbalizations of increased ease of respirations will increase Outcome: Progressing   Problem: Role Relationship: Goal: Family's ability to cope with current situation will improve Outcome: Progressing Goal: Ability to verbalize concerns, feelings, and thoughts to partner or family member will improve Outcome: Progressing   Problem: Pain Management: Goal: Satisfaction with pain management regimen will improve Outcome: Progressing   Problem: Safety: Goal: Non-violent Restraint(s) Outcome: Progressing

## 2024-01-07 NOTE — Progress Notes (Signed)
 [facility] PROGRESS NOTE    Progress Note  Natalie Brown  FMW:[id number] DOB: 07/20/1965 DOA: 01/22/2023 PCP: [name], NP     Brief Narrative:   Natalie Brown is an 58 y.o. female history significant for early onset dementia, bipolar disorder, catatonia essential hypertension sinus tachycardia B12 deficiency severe protein caloric malnutrition he was in the hospital for more than 400 days discharged on 01/22/2023 only to come back to the ED after 49 days due to agitation.  While in the ED evaluated by psychiatry who made changes to his medication.  Patient is currently a DNR/DNI she was evaluated by psych and neurology with no reversible treatable causes.  Her hospital course has been complicated by renal's malnutrition failure to thrive .  She was recently placed to memory care unit on October 2024 upon arrival the patient examined agitation so was denied entry and sent back to the hospital.  Patient has remained hospitalized since then, she is nonverbal does not follow commands can ambulate with assistance.  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 Continue Zyprexa  Ativan  hydroxyzine  and Haldol  as needed. Palliative care is following she is currently a DNR/DNI. Patient unlikely to improve back to baseline per neurology. Further management per psychiatry. Patient was sent to a memory care unit and sent back due to restlessness. Valium  and trazodone  as needed. Now she is full comfort.  Full comfort care: Continue symptomatic management.  Hypovolemic hypernatremia: Noted.  Weight loss/severe protein caloric malnutrition.: Continue diet and iron supplementation.  Sinus tachycardia Likely due to agitation and improved plus minus dehydration. Now improved.  Essential hypertension Well-controlled off  antihypertensive medication continue to monitor.     DVT prophylaxis: lovenox  Family Communication:none Status is: Inpatient Remains inpatient appropriate because: Acute agitation.    Code Status:     Code Status Orders  (From admission, onward)           Start     Ordered   03/13/23 1329  Do not attempt resuscitation (DNR)- Limited -Do Not Intubate (DNI)  Continuous       Question Answer Comment  If pulseless and not breathing No CPR or chest compressions.   In Pre-Arrest Conditions (Patient Is Breathing and Has A Pulse) Do not intubate. Provide all appropriate non-invasive medical interventions. Avoid ICU transfer unless indicated or required.   Consent: Discussion documented in EHR or advanced directives reviewed      03/13/23 1330           Code Status History     Date Active Date Inactive Code Status Order ID Comments User Context   03/13/2023 1020 03/13/2023 1330 Limited: Do not attempt resuscitation (DNR) -DNR-LIMITED -Do Not Intubate/DNI  [id number]  Marcellus Cassondra PARAS, NP ED   03/12/2023 1250 03/13/2023 1020 Do not attempt resuscitation (DNR) - Comfort care [id number]  Marcellus Cassondra PARAS, NP ED   12/18/2022 1559 01/22/2023 1531 Do not attempt resuscitation (DNR) - Comfort care [id number]  Cherisse Ronal ORN, NP Inpatient   03/24/2022 1001 12/18/2022 1559 Full Code [id number]  Dennise Lavada POUR, MD Inpatient   12/24/2021 2334 12/26/2021 0159 Full Code [id number]  Waddell Rake, MD ED   12/24/2021 1935 12/24/2021 2334 Full Code [id number]  Waddell Rake, MD ED   12/24/2021 1908 12/24/2021 1935 Full Code [id number]  Waddell Rake, MD Inpatient   09/06/2021 2224 12/24/2021 1908 Full Code  [id number]  Moishe Bernadette BRAVO, NP Inpatient   09/06/2021 2224 09/06/2021 2224 Full Code [id number]  Moishe Bernadette BRAVO, NP Inpatient   09/05/2021 1528 09/06/2021 2140 Full Code [id number]  Doretha Folks, MD ED   05/26/2020 1636 05/29/2020 1818 Full Code [id number]  Leevy-Johnson, Lyle LABOR, NP ED         IV Access:    Peripheral IV   Procedures and diagnostic studies:   No results found.   Medical Consultants:   None.   Subjective:    Natalie Brown Dec nonverbal. Pt is writhing on bed. Has safety net Objective:    Vitals:   12/30/23 2004 12/31/23 2037 01/04/24 1956 01/05/24 0852  BP: 97/66 121/69 105/78 110/69  Pulse: 64 73 73   Resp: 17 20 18    Temp:   98.2 F (36.8 C)   TempSrc:  Oral    SpO2: 100% 100% 100%   Height:       SpO2: 100 % O2 Flow Rate (L/min): 100 L/min   Intake/Output Summary (Last 24 hours) at 01/07/2024 1404 Last data filed at 01/07/2024 1343 Gross per 24 hour  Intake 877 ml  Output --  Net 877 ml   Filed Weights    Exam: General exam: In no acute distress. Respiratory system: Good air movement and clear to auscultation. Cardiovascular system: S1 & S2 heard, RRR. No JVD. Gastrointestinal system: Abdomen is nondistended, soft and nontender.  Extremities: No pedal edema. Skin: No rashes, lesions or ulcers Psychiatry: No judgment or insight of medical condition.  Data Reviewed:    Labs: Basic Metabolic Panel: No results for input(s): NA, K, CL, CO2, GLUCOSE, BUN, CREATININE, CALCIUM , MG, PHOS in the last 168 hours.  GFR CrCl cannot be calculated (Patient's most recent lab result is older than the maximum 21 days allowed.). Liver Function Tests: No results for input(s): AST, ALT, ALKPHOS, BILITOT, PROT, ALBUMIN  in the last 168 hours.  No results for input(s): LIPASE, AMYLASE in the last 168 hours. No results for input(s): AMMONIA in the last 168 hours. Coagulation profile No results for input(s): INR, PROTIME in the last 168 hours. COVID-19 Labs  No results for input(s): DDIMER, FERRITIN, LDH, CRP in the last 72 hours.  Lab Results  Component Value Date   SARSCOV2NAA NEGATIVE 01/22/2023   SARSCOV2NAA NEGATIVE 05/14/2022   SARSCOV2NAA POSITIVE (A) 01/12/2022   SARSCOV2NAA NEGATIVE  12/24/2021    CBC: No results for input(s): WBC, NEUTROABS, HGB, HCT, MCV, PLT in the last 168 hours.  Cardiac Enzymes: No results for input(s): CKTOTAL, CKMB, CKMBINDEX, TROPONINI in the last 168 hours. BNP (last 3 results) No results for input(s): PROBNP in the last 8760 hours. CBG: No results for input(s): GLUCAP in the last 168 hours. D-Dimer: No results for input(s): DDIMER in the last 72 hours. Hgb A1c: No results for input(s): HGBA1C in the last 72 hours. Lipid Profile: No results for input(s): CHOL, HDL, LDLCALC, TRIG, CHOLHDL, LDLDIRECT in the last 72 hours. Thyroid function studies: No results for input(s): TSH, T4TOTAL, T3FREE, THYROIDAB in the last 72 hours.  Invalid input(s): FREET3 Anemia work up: No results for input(s): VITAMINB12, FOLATE, FERRITIN, TIBC, IRON, RETICCTPCT in the last 72 hours. Sepsis Labs: No results for input(s): PROCALCITON, WBC, LATICACIDVEN in the last 168 hours.  Microbiology No results found for this or any previous visit (from the past 240 hours).   Medications:    feeding supplement  237 mL Oral BID BM   LORazepam   2 mg Oral TID  Continuous Infusions:      LOS: 300 days   Natalie Brown  [facility]  01/07/2024, 2:04 PM

## 2024-01-08 NOTE — Plan of Care (Signed)
  Problem: Education: Goal: Knowledge of the prescribed therapeutic regimen will improve Outcome: Progressing   Problem: Coping: Goal: Ability to identify and develop effective coping behavior will improve Outcome: Progressing   Problem: Clinical Measurements: Goal: Quality of life will improve Outcome: Progressing   Problem: Respiratory: Goal: Verbalizations of increased ease of respirations will increase Outcome: Progressing   Problem: Role Relationship: Goal: Family's ability to cope with current situation will improve Outcome: Progressing Goal: Ability to verbalize concerns, feelings, and thoughts to partner or family member will improve Outcome: Progressing   Problem: Pain Management: Goal: Satisfaction with pain management regimen will improve Outcome: Progressing   Problem: Safety: Goal: Non-violent Restraint(s) Outcome: Progressing

## 2024-01-08 NOTE — Progress Notes (Signed)
 [facility] PROGRESS NOTE    Progress Note  Natalie Brown  FMW:[id number] DOB: 1965-09-28 DOA: 01/22/2023 PCP: [name], NP     Brief Narrative:   Natalie Brown is an 58 y.o. female history significant for early onset dementia, bipolar disorder, catatonia essential hypertension sinus tachycardia B12 deficiency severe protein caloric malnutrition he was in the hospital for more than 400 days discharged on 01/22/2023 only to come back to the ED after 49 days due to agitation.  While in the ED evaluated by psychiatry who made changes to his medication.  Patient is currently a DNR/DNI she was evaluated by psych and neurology with no reversible treatable causes.  Her hospital course has been complicated by renal's malnutrition failure to thrive .  She was recently placed to memory care unit on October 2024 upon arrival the patient examined agitation so was denied entry and sent back to the hospital.  Patient has remained hospitalized since then, she is nonverbal does not follow commands can ambulate with assistance.  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 Continue Zyprexa  Ativan  hydroxyzine  and Haldol  as needed. Palliative care is following she is currently a DNR/DNI. Patient unlikely to improve back to baseline per neurology. Further management per psychiatry. Patient was sent to a memory care unit and sent back due to restlessness. Valium  and trazodone  as needed. Now she is full comfort.  Full comfort care: Continue symptomatic management.  Hypovolemic hypernatremia: Noted.  Weight loss/severe protein caloric malnutrition.: Continue diet and iron supplementation.  Sinus tachycardia Likely due to agitation and improved plus minus dehydration. Now improved.  Essential hypertension Well-controlled off  antihypertensive medication continue to monitor.     DVT prophylaxis: lovenox  Family Communication:none Status is: Inpatient Remains inpatient appropriate because: Acute agitation.    Code Status:     Code Status Orders  (From admission, onward)           Start     Ordered   03/13/23 1329  Do not attempt resuscitation (DNR)- Limited -Do Not Intubate (DNI)  Continuous       Question Answer Comment  If pulseless and not breathing No CPR or chest compressions.   In Pre-Arrest Conditions (Patient Is Breathing and Has A Pulse) Do not intubate. Provide all appropriate non-invasive medical interventions. Avoid ICU transfer unless indicated or required.   Consent: Discussion documented in EHR or advanced directives reviewed      03/13/23 1330           Code Status History     Date Active Date Inactive Code Status Order ID Comments User Context   03/13/2023 1020 03/13/2023 1330 Limited: Do not attempt resuscitation (DNR) -DNR-LIMITED -Do Not Intubate/DNI  [id number]  Marcellus Cassondra PARAS, NP ED   03/12/2023 1250 03/13/2023 1020 Do not attempt resuscitation (DNR) - Comfort care [id number]  Marcellus Cassondra PARAS, NP ED   12/18/2022 1559 01/22/2023 1531 Do not attempt resuscitation (DNR) - Comfort care [id number]  Cherisse Ronal ORN, NP Inpatient   03/24/2022 1001 12/18/2022 1559 Full Code [id number]  Dennise Lavada POUR, MD Inpatient   12/24/2021 2334 12/26/2021 0159 Full Code [id number]  Waddell Rake, MD ED   12/24/2021 1935 12/24/2021 2334 Full Code [id number]  Waddell Rake, MD ED   12/24/2021 1908 12/24/2021 1935 Full Code [id number]  Waddell Rake, MD Inpatient   09/06/2021 2224 12/24/2021 1908 Full Code  [id number]  Moishe Bernadette BRAVO, NP Inpatient   09/06/2021 2224 09/06/2021 2224 Full Code [id number]  Moishe Bernadette BRAVO, NP Inpatient   09/05/2021 1528 09/06/2021 2140 Full Code [id number]  Doretha Folks, MD ED   05/26/2020 1636 05/29/2020 1818 Full Code [id number]  Leevy-Johnson, Lyle LABOR, NP ED         IV Access:    Peripheral IV   Procedures and diagnostic studies:   No results found.   Medical Consultants:   None.   Subjective:    Natalie Brown Dec nonverbal. Pt is writhing on bed. Has safety net Objective:    Vitals:   12/30/23 2004 12/31/23 2037 01/04/24 1956 01/05/24 0852  BP: 97/66 121/69 105/78 110/69  Pulse: 64 73 73   Resp: 17 20 18    Temp:   98.2 F (36.8 C)   TempSrc:  Oral    SpO2: 100% 100% 100%   Height:       SpO2: 100 % O2 Flow Rate (L/min): 100 L/min   Intake/Output Summary (Last 24 hours) at 01/08/2024 1342 Last data filed at 01/08/2024 1326 Gross per 24 hour  Intake 537 ml  Output --  Net 537 ml   Filed Weights    Exam: General exam: In no acute distress. Respiratory system: Good air movement and clear to auscultation. Cardiovascular system: S1 & S2 heard, RRR. No JVD. Gastrointestinal system: Abdomen is nondistended, soft and nontender.  Extremities: No pedal edema. Skin: No rashes, lesions or ulcers Psychiatry: No judgment or insight of medical condition.  Data Reviewed:    Labs: Basic Metabolic Panel: No results for input(s): NA, K, CL, CO2, GLUCOSE, BUN, CREATININE, CALCIUM , MG, PHOS in the last 168 hours.  GFR CrCl cannot be calculated (Patient's most recent lab result is older than the maximum 21 days allowed.). Liver Function Tests: No results for input(s): AST, ALT, ALKPHOS, BILITOT, PROT, ALBUMIN  in the last 168 hours.  No results for input(s): LIPASE, AMYLASE in the last 168 hours. No results for input(s): AMMONIA in the last 168 hours. Coagulation profile No results for input(s): INR, PROTIME in the last 168 hours. COVID-19 Labs  No results for input(s): DDIMER, FERRITIN, LDH, CRP in the last 72 hours.  Lab Results  Component Value Date   SARSCOV2NAA NEGATIVE 01/22/2023   SARSCOV2NAA NEGATIVE 05/14/2022   SARSCOV2NAA POSITIVE (A) 01/12/2022   SARSCOV2NAA NEGATIVE  12/24/2021    CBC: No results for input(s): WBC, NEUTROABS, HGB, HCT, MCV, PLT in the last 168 hours.  Cardiac Enzymes: No results for input(s): CKTOTAL, CKMB, CKMBINDEX, TROPONINI in the last 168 hours. BNP (last 3 results) No results for input(s): PROBNP in the last 8760 hours. CBG: No results for input(s): GLUCAP in the last 168 hours. D-Dimer: No results for input(s): DDIMER in the last 72 hours. Hgb A1c: No results for input(s): HGBA1C in the last 72 hours. Lipid Profile: No results for input(s): CHOL, HDL, LDLCALC, TRIG, CHOLHDL, LDLDIRECT in the last 72 hours. Thyroid function studies: No results for input(s): TSH, T4TOTAL, T3FREE, THYROIDAB in the last 72 hours.  Invalid input(s): FREET3 Anemia work up: No results for input(s): VITAMINB12, FOLATE, FERRITIN, TIBC, IRON, RETICCTPCT in the last 72 hours. Sepsis Labs: No results for input(s): PROCALCITON, WBC, LATICACIDVEN in the last 168 hours.  Microbiology No results found for this or any previous visit (from the past 240 hours).   Medications:    feeding supplement  237 mL Oral BID BM   LORazepam   2 mg Oral TID  Continuous Infusions:      LOS: 301 days   Telena Peyser  [facility]  01/08/2024, 1:42 PM

## 2024-01-09 NOTE — Progress Notes (Signed)
 [facility] PROGRESS NOTE    Progress Note  Natalie Brown  FMW:[id number] DOB: 1965-09-20 DOA: 01/22/2023 PCP: [name], NP     Brief Narrative:   Natalie Brown is an 58 y.o. female history significant for early onset dementia, bipolar disorder, catatonia essential hypertension sinus tachycardia B12 deficiency severe protein caloric malnutrition he was in the hospital for more than 400 days discharged on 01/22/2023 only to come back to the ED after 49 days due to agitation.  While in the ED evaluated by psychiatry who made changes to his medication.  Patient is currently a DNR/DNI she was evaluated by psych and neurology with no reversible treatable causes.  Her hospital course has been complicated by renal's malnutrition failure to thrive .  She was recently placed to memory care unit on October 2024 upon arrival the patient examined agitation so was denied entry and sent back to the hospital.  Patient has remained hospitalized since then, she is nonverbal does not follow commands can ambulate with assistance.  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 Continue Zyprexa  Ativan  hydroxyzine  and Haldol  as needed. Palliative care is following she is currently a DNR/DNI. Patient unlikely to improve back to baseline per neurology. Further management per psychiatry. Patient was sent to a memory care unit and sent back due to restlessness. Valium  and trazodone  as needed. Now she is full comfort.  Full comfort care: Continue symptomatic management.  Hypovolemic hypernatremia: Noted.  Weight loss/severe protein caloric malnutrition.: Continue diet and iron supplementation.  Sinus tachycardia Likely due to agitation and improved plus minus dehydration. Now improved.  Essential hypertension Well-controlled off  antihypertensive medication continue to monitor.     DVT prophylaxis: lovenox  Family Communication:none Status is: Inpatient Remains inpatient appropriate because: Acute agitation.    Code Status:     Code Status Orders  (From admission, onward)           Start     Ordered   03/13/23 1329  Do not attempt resuscitation (DNR)- Limited -Do Not Intubate (DNI)  Continuous       Question Answer Comment  If pulseless and not breathing No CPR or chest compressions.   In Pre-Arrest Conditions (Patient Is Breathing and Has A Pulse) Do not intubate. Provide all appropriate non-invasive medical interventions. Avoid ICU transfer unless indicated or required.   Consent: Discussion documented in EHR or advanced directives reviewed      03/13/23 1330           Code Status History     Date Active Date Inactive Code Status Order ID Comments User Context   03/13/2023 1020 03/13/2023 1330 Limited: Do not attempt resuscitation (DNR) -DNR-LIMITED -Do Not Intubate/DNI  [id number]  Marcellus Cassondra PARAS, NP ED   03/12/2023 1250 03/13/2023 1020 Do not attempt resuscitation (DNR) - Comfort care [id number]  Marcellus Cassondra PARAS, NP ED   12/18/2022 1559 01/22/2023 1531 Do not attempt resuscitation (DNR) - Comfort care [id number]  Cherisse Ronal ORN, NP Inpatient   03/24/2022 1001 12/18/2022 1559 Full Code [id number]  Dennise Lavada POUR, MD Inpatient   12/24/2021 2334 12/26/2021 0159 Full Code [id number]  Waddell Rake, MD ED   12/24/2021 1935 12/24/2021 2334 Full Code [id number]  Waddell Rake, MD ED   12/24/2021 1908 12/24/2021 1935 Full Code [id number]  Waddell Rake, MD Inpatient   09/06/2021 2224 12/24/2021 1908 Full Code  [id number]  Moishe Bernadette BRAVO, NP Inpatient   09/06/2021 2224 09/06/2021 2224 Full Code [id number]  Moishe Bernadette BRAVO, NP Inpatient   09/05/2021 1528 09/06/2021 2140 Full Code [id number]  Doretha Folks, MD ED   05/26/2020 1636 05/29/2020 1818 Full Code [id number]  Leevy-Johnson, Lyle LABOR, NP ED         IV Access:    Peripheral IV   Procedures and diagnostic studies:   No results found.   Medical Consultants:   None.   Subjective:    Natalie Brown nonverbal. Pt is in bed.  Has safety net.  No acute issues overnight Objective:    Vitals:   12/30/23 2004 12/31/23 2037 01/04/24 1956 01/05/24 0852  BP: 97/66 121/69 105/78 110/69  Pulse: 64 73 73   Resp: 17 20 18    Temp:   98.2 F (36.8 C)   TempSrc:  Oral    SpO2: 100% 100% 100%   Height:       SpO2: 100 % O2 Flow Rate (L/min): 100 L/min   Intake/Output Summary (Last 24 hours) at 01/09/2024 1330 Last data filed at 01/09/2024 0800 Gross per 24 hour  Intake 240 ml  Output --  Net 240 ml   Filed Weights    Exam: General exam: In no acute distress. Respiratory system: Good air movement and clear to auscultation. Cardiovascular system: S1 & S2 heard, RRR. No JVD. Gastrointestinal system: Abdomen is nondistended, soft and nontender.  Extremities: No pedal edema. Skin: No rashes, lesions or ulcers Psychiatry: No judgment or insight of medical condition.  Data Reviewed:    Labs: Basic Metabolic Panel: No results for input(s): NA, K, CL, CO2, GLUCOSE, BUN, CREATININE, CALCIUM , MG, PHOS in the last 168 hours.  GFR CrCl cannot be calculated (Patient's most recent lab result is older than the maximum 21 days allowed.). Liver Function Tests: No results for input(s): AST, ALT, ALKPHOS, BILITOT, PROT, ALBUMIN  in the last 168 hours.  No results for input(s): LIPASE, AMYLASE in the last 168 hours. No results for input(s): AMMONIA in the last 168 hours. Coagulation profile No results for input(s): INR, PROTIME in the last 168 hours. COVID-19 Labs  No results for input(s): DDIMER, FERRITIN, LDH, CRP in the last 72 hours.  Lab Results  Component Value Date   SARSCOV2NAA NEGATIVE 01/22/2023   SARSCOV2NAA NEGATIVE 05/14/2022   SARSCOV2NAA POSITIVE (A) 01/12/2022    SARSCOV2NAA NEGATIVE 12/24/2021    CBC: No results for input(s): WBC, NEUTROABS, HGB, HCT, MCV, PLT in the last 168 hours.  Cardiac Enzymes: No results for input(s): CKTOTAL, CKMB, CKMBINDEX, TROPONINI in the last 168 hours. BNP (last 3 results) No results for input(s): PROBNP in the last 8760 hours. CBG: No results for input(s): GLUCAP in the last 168 hours. D-Dimer: No results for input(s): DDIMER in the last 72 hours. Hgb A1c: No results for input(s): HGBA1C in the last 72 hours. Lipid Profile: No results for input(s): CHOL, HDL, LDLCALC, TRIG, CHOLHDL, LDLDIRECT in the last 72 hours. Thyroid function studies: No results for input(s): TSH, T4TOTAL, T3FREE, THYROIDAB in the last 72 hours.  Invalid input(s): FREET3 Anemia work up: No results for input(s): VITAMINB12, FOLATE, FERRITIN, TIBC, IRON, RETICCTPCT in the last 72 hours. Sepsis Labs: No results for input(s): PROCALCITON, WBC, LATICACIDVEN in the last 168 hours.  Microbiology No results found for this or any previous visit (from the past 240 hours).   Medications:    feeding supplement  237 mL Oral BID BM   LORazepam   2 mg Oral TID   Continuous Infusions:      LOS: 302 days   Deadrian Toya  [facility]  01/09/2024, 1:30 PM

## 2024-01-09 NOTE — Plan of Care (Signed)
  Problem: Education: Goal: Knowledge of the prescribed therapeutic regimen will improve Outcome: Progressing   Problem: Coping: Goal: Ability to identify and develop effective coping behavior will improve Outcome: Progressing   Problem: Clinical Measurements: Goal: Quality of life will improve Outcome: Progressing   Problem: Respiratory: Goal: Verbalizations of increased ease of respirations will increase Outcome: Progressing   Problem: Role Relationship: Goal: Family's ability to cope with current situation will improve Outcome: Progressing Goal: Ability to verbalize concerns, feelings, and thoughts to partner or family member will improve Outcome: Progressing   Problem: Pain Management: Goal: Satisfaction with pain management regimen will improve Outcome: Progressing   Problem: Safety: Goal: Non-violent Restraint(s) Outcome: Progressing

## 2024-01-10 NOTE — Progress Notes (Signed)
 [facility] PROGRESS NOTE    Progress Note  Natalie Brown  FMW:[id number] DOB: 15-Dec-1965 DOA: 01/22/2023 PCP: [name], NP     Brief Narrative:   Natalie Brown is an 58 y.o. female history significant for early onset dementia, bipolar disorder, catatonia essential hypertension sinus tachycardia B12 deficiency severe protein caloric malnutrition he was in the hospital for more than 400 days discharged on 01/22/2023 only to come back to the ED after 49 days due to agitation.  While in the ED evaluated by psychiatry who made changes to his medication.  Patient is currently a DNR/DNI she was evaluated by psych and neurology with no reversible treatable causes.  Her hospital course has been complicated by renal's malnutrition failure to thrive .  She was recently placed to memory care unit on October 2024 upon arrival the patient examined agitation so was denied entry and sent back to the hospital.  Patient has remained hospitalized since then, she is nonverbal does not follow commands can ambulate with assistance.  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 Continue Zyprexa  Ativan  hydroxyzine  and Haldol  as needed. Palliative care is following she is currently a DNR/DNI. Patient unlikely to improve back to baseline per neurology. Further management per psychiatry. Patient was sent to a memory care unit and sent back due to restlessness. Valium  and trazodone  as needed. Now she is full comfort.  Full comfort care: Continue symptomatic management.  Hypovolemic hypernatremia: Noted.  Weight loss/severe protein caloric malnutrition.: Continue diet and iron supplementation.  Sinus tachycardia Likely due to agitation and improved plus minus dehydration. Now improved.  Essential hypertension Well-controlled off  antihypertensive medication continue to monitor.     DVT prophylaxis: lovenox  Family Communication:none Status is: Inpatient Remains inpatient appropriate because: Acute agitation.    Code Status:     Code Status Orders  (From admission, onward)           Start     Ordered   03/13/23 1329  Do not attempt resuscitation (DNR)- Limited -Do Not Intubate (DNI)  Continuous       Question Answer Comment  If pulseless and not breathing No CPR or chest compressions.   In Pre-Arrest Conditions (Patient Is Breathing and Has A Pulse) Do not intubate. Provide all appropriate non-invasive medical interventions. Avoid ICU transfer unless indicated or required.   Consent: Discussion documented in EHR or advanced directives reviewed      03/13/23 1330           Code Status History     Date Active Date Inactive Code Status Order ID Comments User Context   03/13/2023 1020 03/13/2023 1330 Limited: Do not attempt resuscitation (DNR) -DNR-LIMITED -Do Not Intubate/DNI  [id number]  Marcellus Cassondra PARAS, NP ED   03/12/2023 1250 03/13/2023 1020 Do not attempt resuscitation (DNR) - Comfort care [id number]  Marcellus Cassondra PARAS, NP ED   12/18/2022 1559 01/22/2023 1531 Do not attempt resuscitation (DNR) - Comfort care [id number]  Cherisse Ronal ORN, NP Inpatient   03/24/2022 1001 12/18/2022 1559 Full Code [id number]  Dennise Lavada POUR, MD Inpatient   12/24/2021 2334 12/26/2021 0159 Full Code [id number]  Waddell Rake, MD ED   12/24/2021 1935 12/24/2021 2334 Full Code [id number]  Waddell Rake, MD ED   12/24/2021 1908 12/24/2021 1935 Full Code [id number]  Waddell Rake, MD Inpatient   09/06/2021 2224 12/24/2021 1908 Full Code  [id number]  Moishe Bernadette BRAVO, NP Inpatient   09/06/2021 2224 09/06/2021 2224 Full Code [id number]  Moishe Bernadette BRAVO, NP Inpatient   09/05/2021 1528 09/06/2021 2140 Full Code [id number]  Doretha Folks, MD ED   05/26/2020 1636 05/29/2020 1818 Full Code [id number]  Leevy-Johnson, Lyle LABOR, NP ED         IV Access:    Peripheral IV   Procedures and diagnostic studies:   No results found.   Medical Consultants:   None.   Subjective:    [name] nonverbal. Pt is in bed.  Has safety net.  No acute issues overnight Objective:    Vitals:   12/30/23 2004 12/31/23 2037 01/04/24 1956 01/05/24 0852  BP: 97/66 121/69 105/78 110/69  Pulse: 64 73 73   Resp: 17 20 18    TempSrc:  Oral    SpO2: 100% 100% 100%   Height:       SpO2: 100 % O2 Flow Rate (L/min): 100 L/min   Intake/Output Summary (Last 24 hours) at 01/10/2024 1406 Last data filed at 01/10/2024 1322 Gross per 24 hour  Intake 517 ml  Output --  Net 517 ml   Filed Weights    Exam: General exam: In no acute distress. Respiratory system: Good air movement and clear to auscultation. Cardiovascular system: S1 & S2 heard, RRR. No JVD. Gastrointestinal system: Abdomen is nondistended, soft and nontender.  Extremities: No pedal edema. Skin: No rashes, lesions or ulcers Psychiatry: No judgment or insight of medical condition.  Data Reviewed:    Labs: Basic Metabolic Panel: No results for input(s): NA, K, CL, CO2, GLUCOSE, BUN, CREATININE, CALCIUM , MG, PHOS in the last 168 hours.  GFR CrCl cannot be calculated (Patient's most recent lab result is older than the maximum 21 days allowed.). Liver Function Tests: No results for input(s): AST, ALT, ALKPHOS, BILITOT, PROT, ALBUMIN  in the last 168 hours.  No results for input(s): LIPASE, AMYLASE in the last 168 hours. No results for input(s): AMMONIA in the last 168 hours. Coagulation profile No results for input(s): INR, PROTIME in the last 168 hours. COVID-19 Labs  No results for input(s): DDIMER, FERRITIN, LDH, CRP in the last 72 hours.  Lab Results  Component Value Date   SARSCOV2NAA NEGATIVE 01/22/2023   SARSCOV2NAA NEGATIVE 05/14/2022   SARSCOV2NAA POSITIVE (A) 01/12/2022   SARSCOV2NAA NEGATIVE 12/24/2021     CBC: No results for input(s): WBC, NEUTROABS, HGB, HCT, MCV, PLT in the last 168 hours.  Cardiac Enzymes: No results for input(s): CKTOTAL, CKMB, CKMBINDEX, TROPONINI in the last 168 hours. BNP (last 3 results) No results for input(s): PROBNP in the last 8760 hours. CBG: No results for input(s): GLUCAP in the last 168 hours. D-Dimer: No results for input(s): DDIMER in the last 72 hours. Hgb A1c: No results for input(s): HGBA1C in the last 72 hours. Lipid Profile: No results for input(s): CHOL, HDL, LDLCALC, TRIG, CHOLHDL, LDLDIRECT in the last 72 hours. Thyroid function studies: No results for input(s): TSH, T4TOTAL, T3FREE, THYROIDAB in the last 72 hours.  Invalid input(s): FREET3 Anemia work up: No results for input(s): VITAMINB12, FOLATE, FERRITIN, TIBC, IRON, RETICCTPCT in the last 72 hours. Sepsis Labs: No results for input(s): PROCALCITON, WBC, LATICACIDVEN in the last 168 hours.  Microbiology No results found for this or any previous visit (from the past 240 hours).   Medications:    feeding supplement  237 mL Oral BID BM   LORazepam   2 mg Oral TID   Continuous Infusions:  LOS: 303 days   Gabrielle Mester  [facility]  01/10/2024, 2:06 PM

## 2024-01-10 NOTE — Plan of Care (Signed)
  Problem: Education: Goal: Knowledge of the prescribed therapeutic regimen will improve Outcome: Progressing   Problem: Coping: Goal: Ability to identify and develop effective coping behavior will improve Outcome: Progressing   Problem: Clinical Measurements: Goal: Quality of life will improve Outcome: Progressing   Problem: Respiratory: Goal: Verbalizations of increased ease of respirations will increase Outcome: Progressing   Problem: Role Relationship: Goal: Family's ability to cope with current situation will improve Outcome: Progressing Goal: Ability to verbalize concerns, feelings, and thoughts to partner or family member will improve Outcome: Progressing   Problem: Pain Management: Goal: Satisfaction with pain management regimen will improve Outcome: Progressing   Problem: Safety: Goal: Non-violent Restraint(s) Outcome: Progressing

## 2024-01-11 NOTE — Plan of Care (Signed)
  Problem: Clinical Measurements: Goal: Quality of life will improve Outcome: Progressing   Problem: Pain Management: Goal: Satisfaction with pain management regimen will improve Outcome: Progressing   

## 2024-01-11 NOTE — Progress Notes (Signed)
 [facility] PROGRESS NOTE    Progress Note  Natalie Brown  FMW:[id number] DOB: 1965/08/25 DOA: 01/22/2023 PCP: [name], NP     Brief Narrative:   Natalie Brown is an 58 y.o. female history significant for early onset dementia, bipolar disorder, catatonia essential hypertension sinus tachycardia B12 deficiency severe protein caloric malnutrition he was in the hospital for more than 400 days discharged on 01/22/2023 only to come back to the ED after 49 days due to agitation.  While in the ED evaluated by psychiatry who made changes to his medication.  Patient is currently a DNR/DNI she was evaluated by psych and neurology with no reversible treatable causes.  Her hospital course has been complicated by renal's malnutrition failure to thrive .  She was recently placed to memory care unit on October 2024 upon arrival the patient examined agitation so was denied entry and sent back to the hospital.  Patient has remained hospitalized since then, she is nonverbal does not follow commands can ambulate with assistance.  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 Continue Zyprexa  Ativan  hydroxyzine  and Haldol  as needed. Palliative care is following she is currently a DNR/DNI. Patient unlikely to improve back to baseline per neurology. Further management per psychiatry. Patient was sent to a memory care unit and sent back due to restlessness. Valium  and trazodone  as needed. Now she is full comfort.  Full comfort care: Continue symptomatic management.  Hypovolemic hypernatremia: Noted.  Weight loss/severe protein caloric malnutrition.: Continue diet and iron supplementation.  Sinus tachycardia Likely due to agitation and improved plus minus dehydration. Now improved.  Essential hypertension Well-controlled off  antihypertensive medication continue to monitor.     DVT prophylaxis: lovenox  Family Communication:none Status is: Inpatient Remains inpatient appropriate because: Acute agitation.    Code Status:     Code Status Orders  (From admission, onward)           Start     Ordered   03/13/23 1329  Do not attempt resuscitation (DNR)- Limited -Do Not Intubate (DNI)  Continuous       Question Answer Comment  If pulseless and not breathing No CPR or chest compressions.   In Pre-Arrest Conditions (Patient Is Breathing and Has A Pulse) Do not intubate. Provide all appropriate non-invasive medical interventions. Avoid ICU transfer unless indicated or required.   Consent: Discussion documented in EHR or advanced directives reviewed      03/13/23 1330           Code Status History     Date Active Date Inactive Code Status Order ID Comments User Context   03/13/2023 1020 03/13/2023 1330 Limited: Do not attempt resuscitation (DNR) -DNR-LIMITED -Do Not Intubate/DNI  [id number]  Marcellus Cassondra PARAS, NP ED   03/12/2023 1250 03/13/2023 1020 Do not attempt resuscitation (DNR) - Comfort care [id number]  Marcellus Cassondra PARAS, NP ED   12/18/2022 1559 01/22/2023 1531 Do not attempt resuscitation (DNR) - Comfort care [id number]  Cherisse Ronal ORN, NP Inpatient   03/24/2022 1001 12/18/2022 1559 Full Code [id number]  Dennise Lavada POUR, MD Inpatient   12/24/2021 2334 12/26/2021 0159 Full Code [id number]  Waddell Rake, MD ED   12/24/2021 1935 12/24/2021 2334 Full Code [id number]  Waddell Rake, MD ED   12/24/2021 1908 12/24/2021 1935 Full Code [id number]  Waddell Rake, MD Inpatient   09/06/2021 2224 12/24/2021 1908 Full Code  [id number]  Moishe Bernadette BRAVO, NP Inpatient   09/06/2021 2224 09/06/2021 2224 Full Code [id number]  Moishe Bernadette BRAVO, NP Inpatient   09/05/2021 1528 09/06/2021 2140 Full Code [id number]  Doretha Folks, MD ED   05/26/2020 1636 05/29/2020 1818 Full Code [id number]  Leevy-Johnson, Lyle LABOR, NP ED         IV Access:    Peripheral IV   Procedures and diagnostic studies:   No results found.   Medical Consultants:   None.   Subjective:    [name] nonverbal. Pt is in bed.  Has safety net.  No acute issues overnight Objective:    Vitals:   12/30/23 2004 12/31/23 2037 01/04/24 1956 01/05/24 0852  BP: 97/66 121/69 105/78 110/69  Pulse: 64 73 73   Resp: 17 20 18    TempSrc:  Oral    SpO2: 100% 100% 100%   Height:       SpO2: 100 % O2 Flow Rate (L/min): 100 L/min  No intake or output data in the 24 hours ending 01/11/24 1358  Filed Weights    Exam: General exam: In no acute distress. Respiratory system: Good air movement and clear to auscultation. Cardiovascular system: S1 & S2 heard, RRR. No JVD. Gastrointestinal system: Abdomen is nondistended, soft and nontender.  Extremities: No pedal edema. Skin: No rashes, lesions or ulcers Psychiatry: No judgment or insight of medical condition.  Data Reviewed:    Labs: Basic Metabolic Panel: No results for input(s): NA, K, CL, CO2, GLUCOSE, BUN, CREATININE, CALCIUM , MG, PHOS in the last 168 hours.  GFR CrCl cannot be calculated (Patient's most recent lab result is older than the maximum 21 days allowed.). Liver Function Tests: No results for input(s): AST, ALT, ALKPHOS, BILITOT, PROT, ALBUMIN  in the last 168 hours.  No results for input(s): LIPASE, AMYLASE in the last 168 hours. No results for input(s): AMMONIA in the last 168 hours. Coagulation profile No results for input(s): INR, PROTIME in the last 168 hours. COVID-19 Labs  No results for input(s): DDIMER, FERRITIN, LDH, CRP in the last 72 hours.  Lab Results  Component Value Date   SARSCOV2NAA NEGATIVE 01/22/2023   SARSCOV2NAA NEGATIVE 05/14/2022   SARSCOV2NAA POSITIVE (A) 01/12/2022   SARSCOV2NAA NEGATIVE 12/24/2021    CBC: No results for input(s): WBC, NEUTROABS, HGB, HCT, MCV, PLT in the last  168 hours.  Cardiac Enzymes: No results for input(s): CKTOTAL, CKMB, CKMBINDEX, TROPONINI in the last 168 hours. BNP (last 3 results) No results for input(s): PROBNP in the last 8760 hours. CBG: No results for input(s): GLUCAP in the last 168 hours. D-Dimer: No results for input(s): DDIMER in the last 72 hours. Hgb A1c: No results for input(s): HGBA1C in the last 72 hours. Lipid Profile: No results for input(s): CHOL, HDL, LDLCALC, TRIG, CHOLHDL, LDLDIRECT in the last 72 hours. Thyroid function studies: No results for input(s): TSH, T4TOTAL, T3FREE, THYROIDAB in the last 72 hours.  Invalid input(s): FREET3 Anemia work up: No results for input(s): VITAMINB12, FOLATE, FERRITIN, TIBC, IRON, RETICCTPCT in the last 72 hours. Sepsis Labs: No results for input(s): PROCALCITON, WBC, LATICACIDVEN in the last 168 hours.  Microbiology No results found for this or any previous visit (from the past 240 hours).   Medications:    feeding supplement  237 mL Oral BID BM   LORazepam   2 mg Oral TID   Continuous Infusions:      LOS: 304 days   Giana Castner  [facility]  01/11/2024, 1:58 PM

## 2024-01-12 NOTE — Progress Notes (Signed)
 [facility] PROGRESS NOTE    Progress Note  Natalie Brown  FMW:[id number] DOB: 1965-05-18 DOA: 01/22/2023 PCP: Natalie Ronal Caldron, NP     Brief Narrative:   Natalie Brown is an 58 y.o. female history significant for early onset dementia, bipolar disorder, catatonia essential hypertension sinus tachycardia B12 deficiency severe protein caloric malnutrition he was in the hospital for more than 400 days discharged on 01/22/2023 only to come back to the ED after 49 days due to agitation.  While in the ED evaluated by psychiatry who made changes to his medication.  Patient is currently a DNR/DNI she was evaluated by psych and neurology with no reversible treatable causes.  Her hospital course has been complicated by renal's malnutrition failure to thrive .  She was recently placed to memory care unit on October 2024 upon arrival the patient examined agitation so was denied entry and sent back to the hospital.  Patient has remained hospitalized since then, she is nonverbal does not follow commands can ambulate with assistance.  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 Continue Zyprexa  Ativan  hydroxyzine  and Haldol  as needed. Palliative care is following she is currently a DNR/DNI. Patient unlikely to improve back to baseline per neurology. Further management per psychiatry. Patient was sent to a memory care unit and sent back due to restlessness. Valium  and trazodone  as needed. Now she is full comfort.  Full comfort care: Continue symptomatic management.  Hypovolemic hypernatremia: Noted.  Weight loss/severe protein caloric malnutrition.: Continue diet and iron supplementation.  Sinus tachycardia Likely due to agitation and improved plus minus dehydration. Now improved.  Essential hypertension Well-controlled off  antihypertensive medication continue to monitor.     DVT prophylaxis: lovenox  Family Communication: No family at the bedside.  Status is: Inpatient Remains inpatient appropriate because: Acute agitation.    Code Status:     Code Status Orders  (From admission, onward)           Start     Ordered   03/13/23 1329  Do not attempt resuscitation (DNR)- Limited -Do Not Intubate (DNI)  Continuous       Question Answer Comment  If pulseless and not breathing No CPR or chest compressions.   In Pre-Arrest Conditions (Patient Is Breathing and Has A Pulse) Do not intubate. Provide all appropriate non-invasive medical interventions. Avoid ICU transfer unless indicated or required.   Consent: Discussion documented in EHR or advanced directives reviewed      03/13/23 1330           Code Status History     Date Active Date Inactive Code Status Order ID Comments User Context   03/13/2023 1020 03/13/2023 1330 Limited: Do not attempt resuscitation (DNR) -DNR-LIMITED -Do Not Intubate/DNI  [id number]  Natalie Cassondra PARAS, NP ED   03/12/2023 1250 03/13/2023 1020 Do not attempt resuscitation (DNR) - Comfort care [id number]  Natalie Cassondra PARAS, NP ED   12/18/2022 1559 01/22/2023 1531 Do not attempt resuscitation (DNR) - Comfort care [id number]  Natalie Ronal ORN, NP Inpatient   03/24/2022 1001 12/18/2022 1559 Full Code [id number]  Natalie Lavada POUR, MD Inpatient   12/24/2021 2334 12/26/2021 0159 Full Code [id number]  Natalie Rake, MD ED   12/24/2021 1935 12/24/2021 2334 Full Code [id number]  Natalie Rake, MD ED   12/24/2021 1908 12/24/2021 1935 Full Code [id number]  Natalie Rake, MD Inpatient  09/06/2021 2224 12/24/2021 1908 Full Code [id number]  Natalie Bernadette BRAVO, NP Inpatient   09/06/2021 2224 09/06/2021 2224 Full Code [id number]  Natalie Bernadette BRAVO, NP Inpatient   09/05/2021 1528 09/06/2021 2140 Full Code [id number]  Natalie Folks, MD ED   05/26/2020 1636 05/29/2020 1818 Full Code [id number]  Leevy-Johnson, Lyle LABOR, NP ED          IV Access:   Peripheral IV   Procedures and diagnostic studies:   No results found.   Medical Consultants:   None.   Subjective:    [name] nonverbal. Pt is in bed.  Has safety net.  No acute issues overnight Objective:    Vitals:   12/30/23 2004 12/31/23 2037 01/04/24 1956 01/05/24 0852  BP: 97/66 121/69 105/78 110/69  Pulse: 64 73 73   Resp: 17 20 18    TempSrc:  Oral    SpO2: 100% 100% 100%   Height:       SpO2: 100 % O2 Flow Rate (L/min): 100 L/min   Intake/Output Summary (Last 24 hours) at 01/12/2024 1128 Last data filed at 01/12/2024 0854 Gross per 24 hour  Intake 236 ml  Output --  Net 236 ml    Filed Weights    Exam: General exam: In no acute distress. Respiratory system: Good air movement and clear to auscultation. Cardiovascular system: S1 & S2 heard, RRR. No JVD. Gastrointestinal system: Abdomen is nondistended, soft and nontender.  Extremities: No pedal edema. Skin: No rashes, lesions or ulcers Psychiatry: No judgment or insight of medical condition.  Data Reviewed:    Labs: Basic Metabolic Panel: No results for input(s): NA, K, CL, CO2, GLUCOSE, BUN, CREATININE, CALCIUM , MG, PHOS in the last 168 hours.  GFR CrCl cannot be calculated (Patient's most recent lab result is older than the maximum 21 days allowed.). Liver Function Tests: No results for input(s): AST, ALT, ALKPHOS, BILITOT, PROT, ALBUMIN  in the last 168 hours.  No results for input(s): LIPASE, AMYLASE in the last 168 hours. No results for input(s): AMMONIA in the last 168 hours. Coagulation profile No results for input(s): INR, PROTIME in the last 168 hours. COVID-19 Labs  No results for input(s): DDIMER, FERRITIN, LDH, CRP in the last 72 hours.  Lab Results  Component Value Date   SARSCOV2NAA NEGATIVE 01/22/2023   SARSCOV2NAA NEGATIVE 05/14/2022   SARSCOV2NAA POSITIVE (A) 01/12/2022   SARSCOV2NAA  NEGATIVE 12/24/2021    CBC: No results for input(s): WBC, NEUTROABS, HGB, HCT, MCV, PLT in the last 168 hours.  Cardiac Enzymes: No results for input(s): CKTOTAL, CKMB, CKMBINDEX, TROPONINI in the last 168 hours. BNP (last 3 results) No results for input(s): PROBNP in the last 8760 hours. CBG: No results for input(s): GLUCAP in the last 168 hours. D-Dimer: No results for input(s): DDIMER in the last 72 hours. Hgb A1c: No results for input(s): HGBA1C in the last 72 hours. Lipid Profile: No results for input(s): CHOL, HDL, LDLCALC, TRIG, CHOLHDL, LDLDIRECT in the last 72 hours. Thyroid function studies: No results for input(s): TSH, T4TOTAL, T3FREE, THYROIDAB in the last 72 hours.  Invalid input(s): FREET3 Anemia work up: No results for input(s): VITAMINB12, FOLATE, FERRITIN, TIBC, IRON, RETICCTPCT in the last 72 hours. Sepsis Labs: No results for input(s): PROCALCITON, WBC, LATICACIDVEN in the last 168 hours.  Microbiology No results found for this or any previous visit (from the past 240 hours).   Medications:    feeding supplement  237 mL Oral BID BM   LORazepam   2 mg  Oral TID   Continuous Infusions:      LOS: 305 days   Anjali Manzella  [facility]  01/12/2024, 11:28 AM

## 2024-01-12 NOTE — Plan of Care (Signed)
  Problem: Education: Goal: Knowledge of the prescribed therapeutic regimen will improve Outcome: Progressing   Problem: Coping: Goal: Ability to identify and develop effective coping behavior will improve Outcome: Progressing   Problem: Clinical Measurements: Goal: Quality of life will improve Outcome: Progressing   Problem: Respiratory: Goal: Verbalizations of increased ease of respirations will increase Outcome: Progressing   Problem: Role Relationship: Goal: Family's ability to cope with current situation will improve Outcome: Progressing Goal: Ability to verbalize concerns, feelings, and thoughts to partner or family member will improve Outcome: Progressing   Problem: Pain Management: Goal: Satisfaction with pain management regimen will improve Outcome: Progressing   Problem: Safety: Goal: Non-violent Restraint(s) Outcome: Progressing

## 2024-01-13 NOTE — Progress Notes (Signed)
 [facility] PROGRESS NOTE    Progress Note  Natalie Brown  FMW:[id number] DOB: 17-Mar-1966 DOA: 01/22/2023 PCP: [name], NP     Brief Narrative:   Natalie Brown is an 58 y.o. female history significant for early onset dementia, bipolar disorder, catatonia essential hypertension sinus tachycardia B12 deficiency severe protein caloric malnutrition he was in the hospital for more than 400 days discharged on 01/22/2023 only to come back to the ED after 49 days due to agitation.  While in the ED evaluated by psychiatry who made changes to his medication.  Patient is currently a DNR/DNI she was evaluated by psych and neurology with no reversible treatable causes.  Her hospital course has been complicated by renal's malnutrition failure to thrive .  She was recently placed to memory care unit on October 2024 upon arrival the patient examined agitation so was denied entry and sent back to the hospital.  Patient has remained hospitalized since then, she is nonverbal does not follow commands can ambulate with assistance.  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 Continue Zyprexa  Ativan  hydroxyzine  and Haldol  as needed. Palliative care is following she is currently a DNR/DNI. Patient unlikely to improve back to baseline per neurology. Further management per psychiatry. Patient was sent to a memory care unit and sent back due to restlessness. Valium  and trazodone  as needed. Now she is full comfort.  Full comfort care: Continue symptomatic management.  Hypovolemic hypernatremia: Noted.  Weight loss/severe protein caloric malnutrition.: Continue diet and iron supplementation.  Sinus tachycardia Likely due to agitation and improved plus minus dehydration. Now improved.  Essential hypertension Well-controlled off  antihypertensive medication continue to monitor.     DVT prophylaxis: lovenox  Family Communication: No family at the bedside.  Status is: Inpatient Remains inpatient appropriate because: Acute agitation.    Code Status:     Code Status Orders  (From admission, onward)           Start     Ordered   03/13/23 1329  Do not attempt resuscitation (DNR)- Limited -Do Not Intubate (DNI)  Continuous       Question Answer Comment  If pulseless and not breathing No CPR or chest compressions.   In Pre-Arrest Conditions (Patient Is Breathing and Has A Pulse) Do not intubate. Provide all appropriate non-invasive medical interventions. Avoid ICU transfer unless indicated or required.   Consent: Discussion documented in EHR or advanced directives reviewed      03/13/23 1330           Code Status History     Date Active Date Inactive Code Status Order ID Comments User Context   03/13/2023 1020 03/13/2023 1330 Limited: Do not attempt resuscitation (DNR) -DNR-LIMITED -Do Not Intubate/DNI  [id number]  Marcellus Cassondra PARAS, NP ED   03/12/2023 1250 03/13/2023 1020 Do not attempt resuscitation (DNR) - Comfort care [id number]  Marcellus Cassondra PARAS, NP ED   12/18/2022 1559 01/22/2023 1531 Do not attempt resuscitation (DNR) - Comfort care [id number]  Cherisse Ronal ORN, NP Inpatient   03/24/2022 1001 12/18/2022 1559 Full Code [id number]  Dennise Lavada POUR, MD Inpatient   12/24/2021 2334 12/26/2021 0159 Full Code [id number]  Waddell Rake, MD ED   12/24/2021 1935 12/24/2021 2334 Full Code [id number]  Waddell Rake, MD ED   12/24/2021 1908 12/24/2021 1935 Full Code [id number]  Waddell Rake, MD Inpatient  09/06/2021 2224 12/24/2021 1908 Full Code [id number]  Moishe Bernadette BRAVO, NP Inpatient   09/06/2021 2224 09/06/2021 2224 Full Code [id number]  Moishe Bernadette BRAVO, NP Inpatient   09/05/2021 1528 09/06/2021 2140 Full Code [id number]  Doretha Folks, MD ED   05/26/2020 1636 05/29/2020 1818 Full Code [id number]  Leevy-Johnson, Lyle LABOR, NP ED          IV Access:   Peripheral IV   Procedures and diagnostic studies:   No results found.   Medical Consultants:   None.   Subjective:    [name] nonverbal. Pt is in bed.  Has safety net.  No acute issues overnight Objective:   Patient seen at the bedside.  [name] inside safety net, resting in bed.  Breathing comfortably.  Chest clear to auscultation, CVS, S1, S2, no murmur Vitals:   12/30/23 2004 12/31/23 2037 01/04/24 1956 01/05/24 0852  BP: 97/66 121/69 105/78 110/69  Pulse: 64 73 73   Resp: 17 20 18    TempSrc:  Oral    SpO2: 100% 100% 100%   Height:       SpO2: 100 % O2 Flow Rate (L/min): 100 L/min   Intake/Output Summary (Last 24 hours) at 01/13/2024 1349 Last data filed at 01/13/2024 9078 Gross per 24 hour  Intake 100 ml  Output --  Net 100 ml    [employer]   Data Reviewed:    Labs: Basic Metabolic Panel: No results for input(s): NA, K, CL, CO2, GLUCOSE, BUN, CREATININE, CALCIUM , MG, PHOS in the last 168 hours.  GFR CrCl cannot be calculated (Patient's most recent lab result is older than the maximum 21 days allowed.). Liver Function Tests: No results for input(s): AST, ALT, ALKPHOS, BILITOT, PROT, ALBUMIN  in the last 168 hours.  No results for input(s): LIPASE, AMYLASE in the last 168 hours. No results for input(s): AMMONIA in the last 168 hours. Coagulation profile No results for input(s): INR, PROTIME in the last 168 hours. COVID-19 Labs  No results for input(s): DDIMER, FERRITIN, LDH, CRP in the last 72 hours.  Lab Results  Component Value Date   SARSCOV2NAA NEGATIVE 01/22/2023   SARSCOV2NAA NEGATIVE 05/14/2022   SARSCOV2NAA POSITIVE (A) 01/12/2022   SARSCOV2NAA NEGATIVE 12/24/2021    CBC: No results for input(s): WBC, NEUTROABS, HGB, HCT, MCV, PLT in the last 168 hours.  Cardiac Enzymes: No results for input(s): CKTOTAL, CKMB, CKMBINDEX, TROPONINI  in the last 168 hours. BNP (last 3 results) No results for input(s): PROBNP in the last 8760 hours. CBG: No results for input(s): GLUCAP in the last 168 hours. D-Dimer: No results for input(s): DDIMER in the last 72 hours. Hgb A1c: No results for input(s): HGBA1C in the last 72 hours. Lipid Profile: No results for input(s): CHOL, HDL, LDLCALC, TRIG, CHOLHDL, LDLDIRECT in the last 72 hours. Thyroid function studies: No results for input(s): TSH, T4TOTAL, T3FREE, THYROIDAB in the last 72 hours.  Invalid input(s): FREET3 Anemia work up: No results for input(s): VITAMINB12, FOLATE, FERRITIN, TIBC, IRON, RETICCTPCT in the last 72 hours. Sepsis Labs: No results for input(s): PROCALCITON, WBC, LATICACIDVEN in the last 168 hours.  Microbiology No results found for this or any previous visit (from the past 240 hours).   Medications:    feeding supplement  237 mL Oral BID BM   LORazepam   2 mg Oral TID   Continuous Infusions:      LOS: 306 days   Jakerra Floyd  [facility]  01/13/2024, 1:49 PM

## 2024-01-13 NOTE — Plan of Care (Signed)
  Problem: Education: Goal: Knowledge of the prescribed therapeutic regimen will improve Outcome: Progressing   Problem: Coping: Goal: Ability to identify and develop effective coping behavior will improve Outcome: Progressing   Problem: Clinical Measurements: Goal: Quality of life will improve Outcome: Progressing   Problem: Respiratory: Goal: Verbalizations of increased ease of respirations will increase Outcome: Progressing   Problem: Role Relationship: Goal: Family's ability to cope with current situation will improve Outcome: Progressing Goal: Ability to verbalize concerns, feelings, and thoughts to partner or family member will improve Outcome: Progressing   Problem: Pain Management: Goal: Satisfaction with pain management regimen will improve Outcome: Progressing   Problem: Safety: Goal: Non-violent Restraint(s) Outcome: Progressing

## 2024-01-13 NOTE — Plan of Care (Signed)
  Problem: Pain Management: Goal: Satisfaction with pain management regimen will improve Outcome: Progressing   

## 2024-01-14 MED ORDER — LORAZEPAM 2 MG/ML PO CONC
1.5000 mg | Freq: Three times a day (TID) | ORAL | Status: DC
  Administered 2024-01-14 – 2024-01-18 (×10): 1.5 mg via ORAL
  Filled 2024-01-14 (×10): qty 1

## 2024-01-14 NOTE — Progress Notes (Signed)
 PROGRESS NOTE    Natalie Brown  FMW:[id number] DOB: [birth year] DOA: 01/22/2023 PCP: [name], NP   Brief Narrative:    58 y.o. female history significant for early onset dementia, bipolar disorder, catatonia essential hypertension sinus tachycardia B12 deficiency severe protein caloric malnutrition he was in the hospital for more than 400 days discharged on 01/22/2023 only to come back to the ED after 49 days due to agitation.  While in the ED evaluated by psychiatry who made changes to his medication.  Patient is currently a DNR/DNI she was evaluated by psych and neurology with no reversible treatable causes.  Her hospital course has been complicated by renal's malnutrition failure to thrive .  She was recently placed to memory care unit on October 2024 upon arrival the patient examined agitation so was denied entry and sent back to the hospital.  Patient has remained hospitalized since then, she is nonverbal does not follow commands can ambulate with assistance.  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 Continue Zyprexa  Ativan  hydroxyzine  and Haldol  as needed. Palliative care is following she is currently a DNR/DNI. Patient unlikely to improve back to baseline per neurology. Further management per psychiatry. Patient was sent to a memory care unit and sent back due to restlessness. Valium  and trazodone  as needed. Now she is full comfort.   Full comfort care: Continue symptomatic management.   Hypovolemic hypernatremia: Noted.   Weight loss/severe protein  caloric malnutrition.: Continue diet and iron supplementation.   Sinus tachycardia Likely due to agitation, now it has improved    Essential hypertension Well-controlled off antihypertensive medication, continue to monitor.   DVT prophylaxis: Not indicated     Code Status: Limited: Do not attempt resuscitation (DNR) -DNR-LIMITED -Do Not Intubate/DNI  Family Communication:  None at the bedside Status is: Inpatient Remains inpatient appropriate because: pending placement    Subjective:  No acute events overnight. Lying in bed comfortably. Non-communicative  Examination:  General exam: Appears calm and comfortable  Respiratory system: Clear to auscultation. Respiratory effort normal. Cardiovascular system: S1 & S2 heard, RRR. No JVD, murmurs, rubs, gallops or clicks. No pedal edema. Gastrointestinal system: Abdomen is nondistended, soft and nontender. No organomegaly or masses felt. Normal bowel sounds heard. Central nervous system: No focal neurological deficits. Extremities: Symmetric 5 x 5 power. Skin: No rashes, lesions or ulcers       Diet Orders (From admission, onward)     Start     Ordered   03/13/23 1330  Diet regular Room service appropriate? Yes; Fluid consistency: Thin  Diet effective now       Question Answer Comment  Room service appropriate? Yes   Fluid consistency: Thin      03/13/23 1330            Objective: Vitals:   12/31/23 2037 01/04/24 1956 01/05/24 0852 01/13/24 2006  BP: 121/69 105/78 110/69 110/75  Pulse: 73 73  (!) 118  Resp: 20 18  18   Temp:    98 F (36.7 C)  TempSrc: Oral   Axillary  SpO2: 100% 100%  Height:       No intake or output data in the 24 hours ending 01/14/24 1141 Filed Weights    Scheduled Meds:  feeding supplement  237 mL Oral BID BM   LORazepam   2 mg Oral TID   Continuous Infusions:  Nutritional status Signs/Symptoms: severe fat depletion, severe muscle depletion, energy intake < or equal to 50%  for > or equal to 5 days Interventions: Magic cup Body mass index is 14.23 kg/m.  Data Reviewed:   CBC: No results for input(s): WBC, NEUTROABS, HGB, HCT, MCV, PLT in the last 168 hours. Basic Metabolic Panel: No results for input(s): NA, K, CL, CO2, GLUCOSE, BUN, CREATININE, CALCIUM , MG, PHOS in the last 168 hours. GFR: CrCl cannot be calculated (Patient's most recent lab result is older than the maximum 21 days allowed.). Liver Function Tests: No results for input(s): AST, ALT, ALKPHOS, BILITOT, PROT, ALBUMIN  in the last 168 hours. No results for input(s): LIPASE, AMYLASE in the last 168 hours. No results for input(s): AMMONIA in the last 168 hours. Coagulation Profile: No results for input(s): INR, PROTIME in the last 168 hours. Cardiac Enzymes: No results for input(s): CKTOTAL, CKMB, CKMBINDEX, TROPONINI in the last 168 hours. BNP (last 3 results) No results for input(s): PROBNP in the last 8760 hours. HbA1C: No results for input(s): HGBA1C in the last 72 hours. CBG: No results for input(s): GLUCAP in the last 168 hours. Lipid Profile: No results for input(s): CHOL, HDL, LDLCALC, TRIG, CHOLHDL, LDLDIRECT in the last 72 hours. Thyroid Function Tests: No results for input(s): TSH, T4TOTAL, FREET4, T3FREE, THYROIDAB in the last 72 hours. Anemia Panel: No results for input(s): VITAMINB12, FOLATE, FERRITIN, TIBC, IRON, RETICCTPCT in the last 72 hours. Sepsis Labs: No results for input(s): PROCALCITON, LATICACIDVEN in the last 168 hours.  No results found for this or any previous visit (from the past 240 hours).      Radiology Studies: No results found.       LOS: 307 days   Time spent= 35 mins    [name], MD [facility]  If 7PM-7AM, please contact night-coverage  01/14/2024, 11:41 AM

## 2024-01-14 NOTE — Plan of Care (Signed)
  Problem: Education: Goal: Knowledge of the prescribed therapeutic regimen will improve Outcome: Progressing   Problem: Coping: Goal: Ability to identify and develop effective coping behavior will improve Outcome: Progressing   Problem: Clinical Measurements: Goal: Quality of life will improve Outcome: Progressing   Problem: Respiratory: Goal: Verbalizations of increased ease of respirations will increase Outcome: Progressing   Problem: Role Relationship: Goal: Family's ability to cope with current situation will improve Outcome: Progressing Goal: Ability to verbalize concerns, feelings, and thoughts to partner or family member will improve Outcome: Progressing   Problem: Pain Management: Goal: Satisfaction with pain management regimen will improve Outcome: Progressing   Problem: Safety: Goal: Non-violent Restraint(s) Outcome: Progressing

## 2024-01-15 NOTE — Plan of Care (Signed)
  Problem: Education: Goal: Knowledge of the prescribed therapeutic regimen will improve Outcome: Not Progressing   Problem: Coping: Goal: Ability to identify and develop effective coping behavior will improve Outcome: Not Progressing   Problem: Clinical Measurements: Goal: Quality of life will improve Outcome: Not Progressing   Problem: Respiratory: Goal: Verbalizations of increased ease of respirations will increase Outcome: Not Progressing   Problem: Role Relationship: Goal: Family's ability to cope with current situation will improve Outcome: Not Progressing Goal: Ability to verbalize concerns, feelings, and thoughts to partner or family member will improve Outcome: Not Progressing   Problem: Pain Management: Goal: Satisfaction with pain management regimen will improve Outcome: Not Progressing   Problem: Safety: Goal: Non-violent Restraint(s) Outcome: Not Progressing

## 2024-01-15 NOTE — Plan of Care (Signed)
  Problem: Education: Goal: Knowledge of the prescribed therapeutic regimen will improve Outcome: Progressing   Problem: Coping: Goal: Ability to identify and develop effective coping behavior will improve Outcome: Progressing   Problem: Clinical Measurements: Goal: Quality of life will improve Outcome: Progressing   Problem: Respiratory: Goal: Verbalizations of increased ease of respirations will increase Outcome: Progressing   Problem: Role Relationship: Goal: Family's ability to cope with current situation will improve Outcome: Progressing Goal: Ability to verbalize concerns, feelings, and thoughts to partner or family member will improve Outcome: Progressing   Problem: Pain Management: Goal: Satisfaction with pain management regimen will improve Outcome: Progressing   Problem: Safety: Goal: Non-violent Restraint(s) Outcome: Progressing

## 2024-01-15 NOTE — Progress Notes (Signed)
 PROGRESS NOTE    Natalie Brown  FMW:[id number] DOB: Apr 26, 1965 DOA: 01/22/2023 PCP: [name], NP   Brief Narrative:    58 y.o. female history significant for early onset dementia, bipolar disorder, catatonia essential hypertension sinus tachycardia B12 deficiency severe protein caloric malnutrition he was in the hospital for more than 400 days discharged on 01/22/2023 only to come back to the ED after 49 days due to agitation.  While in the ED evaluated by psychiatry who made changes to his medication.  Patient is currently a DNR/DNI she was evaluated by psych and neurology with no reversible treatable causes.  Her hospital course has been complicated by renal's malnutrition failure to thrive .  She was recently placed to memory care unit on October 2024 upon arrival the patient examined agitation so was denied entry and sent back to the hospital.  Patient has remained hospitalized since then, she is nonverbal does not follow commands can ambulate with assistance.  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 Continue Zyprexa , Ativan , hydroxyzine  and Haldol  as needed. Palliative care is following, she is currently a DNR/DNI. Patient unlikely to improve back to baseline per neurology. Further management per psychiatry. Patient was sent to a memory care unit and sent back due to restlessness. Valium  and trazodone  as needed. Now she is full comfort.   Full comfort care: Continue symptomatic management.   Hypovolemic hypernatremia: Noted.   Weight loss/severe  protein caloric malnutrition.: Continue diet and iron supplementation.   Sinus tachycardia Likely due to agitation, now it has improved    Essential hypertension Well-controlled off antihypertensive medication, continue to monitor.   DVT prophylaxis: Not indicated     Code Status: Limited: Do not attempt resuscitation (DNR) -DNR-LIMITED -Do Not Intubate/DNI  Family Communication:  None at the bedside Status is: Inpatient Remains inpatient appropriate because: pending placement    Subjective:  No acute events overnight. Lying in bed comfortably. Non-communicative  Examination:  General exam: Appears calm and comfortable  Respiratory system: Clear to auscultation. Respiratory effort normal. Cardiovascular system: S1 & S2 heard, RRR. No JVD, murmurs, rubs, gallops or clicks. No pedal edema. Gastrointestinal system: Abdomen is nondistended, soft and nontender. No organomegaly or masses felt. Normal bowel sounds heard. Central nervous system: No focal neurological deficits. Extremities: Symmetric 5 x 5 power. Skin: No rashes, lesions or ulcers       Diet Orders (From admission, onward)     Start     Ordered   03/13/23 1330  Diet regular Room service appropriate? Yes; Fluid consistency: Thin  Diet effective now       Question Answer Comment  Room service appropriate? Yes   Fluid consistency: Thin      03/13/23 1330            Objective: Vitals:   12/31/23 2037 01/04/24 1956 01/05/24 0852 01/13/24 2006  BP: 121/69 105/78 110/69 110/75  Pulse: 73 73  (!) 118  Resp: 20 18  18   Temp:    98 F (36.7 C)  TempSrc: Oral   Axillary  SpO2: 100% 100%  Height:        Intake/Output Summary (Last 24 hours) at 01/15/2024 1114 Last data filed at 01/14/2024 2200 Gross per 24 hour  Intake 100 ml  Output --  Net 100 ml   Filed Weights    Scheduled Meds:  feeding supplement  237 mL Oral BID BM   LORazepam   1.5 mg Oral TID   Continuous Infusions:  Nutritional  status Signs/Symptoms: severe fat depletion, severe muscle depletion, energy intake < or equal to 50% for > or equal to 5 days Interventions: Magic cup Body mass index is 14.23 kg/m.  Data Reviewed:   CBC: No results for input(s): WBC, NEUTROABS, HGB, HCT, MCV, PLT in the last 168 hours. Basic Metabolic Panel: No results for input(s): NA, K, CL, CO2, GLUCOSE, BUN, CREATININE, CALCIUM , MG, PHOS in the last 168 hours. GFR: CrCl cannot be calculated (Patient's most recent lab result is older than the maximum 21 days allowed.). Liver Function Tests: No results for input(s): AST, ALT, ALKPHOS, BILITOT, PROT, ALBUMIN  in the last 168 hours. No results for input(s): LIPASE, AMYLASE in the last 168 hours. No results for input(s): AMMONIA in the last 168 hours. Coagulation Profile: No results for input(s): INR, PROTIME in the last 168 hours. Cardiac Enzymes: No results for input(s): CKTOTAL, CKMB, CKMBINDEX, TROPONINI in the last 168 hours. BNP (last 3 results) No results for input(s): PROBNP in the last 8760 hours. HbA1C: No results for input(s): HGBA1C in the last 72 hours. CBG: No results for input(s): GLUCAP in the last 168 hours. Lipid Profile: No results for input(s): CHOL, HDL, LDLCALC, TRIG, CHOLHDL, LDLDIRECT in the last 72 hours. Thyroid Function Tests: No results for input(s): TSH, T4TOTAL, FREET4, T3FREE, THYROIDAB in the last 72 hours. Anemia Panel: No results for input(s): VITAMINB12, FOLATE, FERRITIN, TIBC, IRON, RETICCTPCT in the last 72 hours. Sepsis Labs: No results for input(s): PROCALCITON, LATICACIDVEN in the last 168 hours.  No results found for this or any previous visit (from the past 240 hours).      Radiology Studies: No results found.       LOS: 308 days   Time spent= 35 mins    [name], MD [facility]  If 7PM-7AM, please  contact night-coverage  01/15/2024, 11:14 AM

## 2024-01-16 NOTE — Plan of Care (Signed)
  Problem: Education: Goal: Knowledge of the prescribed therapeutic regimen will improve Outcome: Not Progressing   Problem: Coping: Goal: Ability to identify and develop effective coping behavior will improve Outcome: Not Progressing   Problem: Clinical Measurements: Goal: Quality of life will improve Outcome: Not Progressing   Problem: Respiratory: Goal: Verbalizations of increased ease of respirations will increase Outcome: Not Progressing   Problem: Role Relationship: Goal: Family's ability to cope with current situation will improve Outcome: Not Progressing Goal: Ability to verbalize concerns, feelings, and thoughts to partner or family member will improve Outcome: Not Progressing   Problem: Pain Management: Goal: Satisfaction with pain management regimen will improve Outcome: Not Progressing   Problem: Safety: Goal: Non-violent Restraint(s) Outcome: Not Progressing

## 2024-01-16 NOTE — Progress Notes (Signed)
 PROGRESS NOTE    Natalie Brown  FMW:[id number] DOB: 31-Jul-1965 DOA: 01/22/2023 PCP: [name], NP   Brief Narrative:    58 y.o. female history significant for early onset dementia, bipolar disorder, catatonia essential hypertension sinus tachycardia B12 deficiency severe protein caloric malnutrition he was in the hospital for more than 400 days discharged on 01/22/2023 only to come back to the ED after 49 days due to agitation.  While in the ED evaluated by psychiatry who made changes to his medication.  Patient is currently a DNR/DNI she was evaluated by psych and neurology with no reversible treatable causes.  Her hospital course has been complicated by renal's malnutrition failure to thrive .  She was recently placed to memory care unit on October 2024 upon arrival the patient examined agitation so was denied entry and sent back to the hospital.  Patient has remained hospitalized since then, she is nonverbal does not follow commands can ambulate with assistance.  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 Continue Zyprexa , Ativan , hydroxyzine  and Haldol  as needed. Palliative care is following, she is currently a DNR/DNI. Patient unlikely to improve back to baseline per neurology. Further management per psychiatry. Patient was sent to a memory care unit and sent back due to restlessness. Valium  and trazodone  as needed. Now, she is full comfort.   Full comfort care: Continue symptomatic management.   Hypovolemic hypernatremia: Noted.   Weight loss/severe  protein caloric malnutrition.: Continue diet and iron supplementation.   Sinus tachycardia Likely due to agitation, now it has improved    Essential hypertension Well-controlled off antihypertensive medication, continue to monitor.   DVT prophylaxis: Not indicated     Code Status: Limited: Do not attempt resuscitation (DNR) -DNR-LIMITED -Do Not Intubate/DNI  Family Communication:  None at the bedside Status is: Inpatient Remains inpatient appropriate because: pending placement    Subjective:  No acute events overnight. Lying in bed comfortably. Non-communicative  Examination:  General exam: Appears calm and comfortable  Respiratory system: Clear to auscultation. Respiratory effort normal. Cardiovascular system: S1 & S2 heard, RRR. No JVD, murmurs, rubs, gallops or clicks. No pedal edema. Gastrointestinal system: Abdomen is nondistended, soft and nontender. No organomegaly or masses felt. Normal bowel sounds heard. Central nervous system: No focal neurological deficits. Extremities: Symmetric 5 x 5 power. Skin: No rashes, lesions or ulcers       Diet Orders (From admission, onward)     Start     Ordered   03/13/23 1330  Diet regular Room service appropriate? Yes; Fluid consistency: Thin  Diet effective now       Question Answer Comment  Room service appropriate? Yes   Fluid consistency: Thin      03/13/23 1330            Objective: Vitals:   01/04/24 1956 01/05/24 0852 01/13/24 2006 01/15/24 1141  BP: 105/78 110/69 110/75 115/75  Pulse: 73  (!) 118 (!) 118  Resp: 18  18 20   Temp:   98 F (36.7 C) 98.1 F (36.7 C)  TempSrc:   Axillary Oral  SpO2: 100%   100%  Height:        Intake/Output Summary (Last 24 hours) at 01/16/2024 9071 Last data filed at 01/16/2024 9341 Gross per 24 hour  Intake 598 ml  Output --  Net 598 ml   Filed Weights    Scheduled Meds:  feeding supplement  237 mL Oral BID BM   LORazepam   1.5 mg Oral TID   Continuous  Infusions:  Nutritional status Signs/Symptoms: severe fat depletion, severe muscle depletion, energy intake < or equal to 50% for > or equal to 5 days Interventions: Magic cup Body mass index is 14.23 kg/m.  Data Reviewed:   CBC: No results for input(s): WBC, NEUTROABS, HGB, HCT, MCV, PLT in the last 168 hours. Basic Metabolic Panel: No results for input(s): NA, K, CL, CO2, GLUCOSE, BUN, CREATININE, CALCIUM , MG, PHOS in the last 168 hours. GFR: CrCl cannot be calculated (Patient's most recent lab result is older than the maximum 21 days allowed.). Liver Function Tests: No results for input(s): AST, ALT, ALKPHOS, BILITOT, PROT, ALBUMIN  in the last 168 hours. No results for input(s): LIPASE, AMYLASE in the last 168 hours. No results for input(s): AMMONIA in the last 168 hours. Coagulation Profile: No results for input(s): INR, PROTIME in the last 168 hours. Cardiac Enzymes: No results for input(s): CKTOTAL, CKMB, CKMBINDEX, TROPONINI in the last 168 hours. BNP (last 3 results) No results for input(s): PROBNP in the last 8760 hours. HbA1C: No results for input(s): HGBA1C in the last 72 hours. CBG: No results for input(s): GLUCAP in the last 168 hours. Lipid Profile: No results for input(s): CHOL, HDL, LDLCALC, TRIG, CHOLHDL, LDLDIRECT in the last 72 hours. Thyroid Function Tests: No results for input(s): TSH, T4TOTAL, FREET4, T3FREE, THYROIDAB in the last 72 hours. Anemia Panel: No results for input(s): VITAMINB12, FOLATE, FERRITIN, TIBC, IRON, RETICCTPCT in the last 72 hours. Sepsis Labs: No results for input(s): PROCALCITON, LATICACIDVEN in the last 168 hours.  No results found for this or any previous visit (from the past 240 hours).      Radiology Studies: No results found.       LOS: 309 days   Time spent= 35 mins    [name], MD [facility]  If 7PM-7AM, please contact night-coverage  01/16/2024, 9:28 AM

## 2024-01-16 NOTE — Plan of Care (Signed)
  Problem: Safety: Goal: Non-violent Restraint(s) Outcome: Completed/Met   Problem: Education: Goal: Knowledge of the prescribed therapeutic regimen will improve Outcome: Completed/Met   Problem: Coping: Goal: Ability to identify and develop effective coping behavior will improve Outcome: Completed/Met   Problem: Clinical Measurements: Goal: Quality of life will improve Outcome: Completed/Met   Problem: Respiratory: Goal: Verbalizations of increased ease of respirations will increase Outcome: Completed/Met   Problem: Role Relationship: Goal: Family's ability to cope with current situation will improve Outcome: Completed/Met Goal: Ability to verbalize concerns, feelings, and thoughts to partner or family member will improve Outcome: Completed/Met   Problem: Pain Management: Goal: Satisfaction with pain management regimen will improve Outcome: Completed/Met

## 2024-01-17 NOTE — Plan of Care (Signed)
  Problem: Education: Goal: Knowledge of the prescribed therapeutic regimen will improve Outcome: Progressing   Problem: Coping: Goal: Ability to identify and develop effective coping behavior will improve Outcome: Progressing   Problem: Clinical Measurements: Goal: Quality of life will improve Outcome: Progressing   Problem: Respiratory: Goal: Verbalizations of increased ease of respirations will increase Outcome: Progressing   Problem: Role Relationship: Goal: Family's ability to cope with current situation will improve Outcome: Progressing Goal: Ability to verbalize concerns, feelings, and thoughts to partner or family member will improve Outcome: Progressing   Problem: Pain Management: Goal: Satisfaction with pain management regimen will improve Outcome: Progressing   

## 2024-01-17 NOTE — Progress Notes (Signed)
 PROGRESS NOTE    Natalie Brown  FMW:[id number] DOB: July 31, 1965 DOA: 01/22/2023 PCP: [name], NP   Brief Narrative:    58 y.o. female history significant for early onset dementia, bipolar disorder, catatonia essential hypertension sinus tachycardia B12 deficiency severe protein caloric malnutrition he was in the hospital for more than 400 days discharged on 01/22/2023 only to come back to the ED after 49 days due to agitation.  While in the ED evaluated by psychiatry who made changes to his medication.  Patient is currently a DNR/DNI she was evaluated by psych and neurology with no reversible treatable causes.  Her hospital course has been complicated by renal's malnutrition failure to thrive .  She was recently placed to memory care unit on October 2024 upon arrival the patient examined agitation so was denied entry and sent back to the hospital.  Patient has remained hospitalized since then, she is nonverbal does not follow commands can ambulate with assistance.  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 Continue Zyprexa , Ativan , hydroxyzine  and Haldol  as needed. Palliative care is following, she is currently a DNR/DNI. Patient unlikely to improve back to baseline per neurology. Further management per psychiatry. Patient was sent to a memory care unit and sent back due to restlessness. Valium  and trazodone  as needed. Now, she is full comfort.   Full comfort care: Continue symptomatic management.   Hypovolemic hypernatremia: Noted.   Weight loss/severe  protein caloric malnutrition.: Continue diet and iron supplementation.   Sinus tachycardia Likely due to agitation, now it has improved    Essential hypertension Well-controlled off antihypertensive medication, continue to monitor.   DVT prophylaxis: Not indicated     Code Status: Limited: Do not attempt resuscitation (DNR) -DNR-LIMITED -Do Not Intubate/DNI  Family Communication:  None at the bedside Status is: Inpatient Remains inpatient appropriate because: pending placement    Subjective:  No acute events overnight. Lying in bed comfortably. Non-communicative  Examination:  General exam: Appears calm and comfortable  Respiratory system: Clear to auscultation. Respiratory effort normal. Cardiovascular system: S1 & S2 heard, RRR. No JVD, murmurs, rubs, gallops or clicks. No pedal edema. Gastrointestinal system: Abdomen is nondistended, soft and nontender. No organomegaly or masses felt. Normal bowel sounds heard. Central nervous system: No focal neurological deficits. Extremities: Symmetric 5 x 5 power. Skin: No rashes, lesions or ulcers       Diet Orders (From admission, onward)     Start     Ordered   03/13/23 1330  Diet regular Room service appropriate? Yes; Fluid consistency: Thin  Diet effective now       Question Answer Comment  Room service appropriate? Yes   Fluid consistency: Thin      03/13/23 1330            Objective: Vitals:   01/05/24 0852 01/13/24 2006 01/15/24 1141 01/16/24 1050  BP: 110/69 110/75 115/75 101/81  Pulse:  (!) 118 (!) 118 (!) 124  Resp:  18 20 19   Temp:  98 F (36.7 C) 98.1 F (36.7 C) (!) 97.5 F (36.4 C)  TempSrc:  Axillary Oral   SpO2:   100%   Height:        Intake/Output Summary (Last 24 hours) at 01/17/2024 0932 Last data filed at 01/17/2024 0600 Gross per 24 hour  Intake 240 ml  Output --  Net 240 ml   Filed Weights    Scheduled Meds:  feeding supplement  237 mL Oral BID BM   LORazepam   1.5 mg Oral  TID   Continuous Infusions:  Nutritional status Signs/Symptoms: severe fat depletion, severe muscle depletion, energy intake < or equal to 50% for > or equal to 5 days Interventions: Magic cup Body mass index is 14.23 kg/m.  Data Reviewed:   CBC: No results for input(s): WBC, NEUTROABS, HGB, HCT, MCV, PLT in the last 168 hours. Basic Metabolic Panel: No results for input(s): NA, K, CL, CO2, GLUCOSE, BUN, CREATININE, CALCIUM , MG, PHOS in the last 168 hours. GFR: CrCl cannot be calculated (Patient's most recent lab result is older than the maximum 21 days allowed.). Liver Function Tests: No results for input(s): AST, ALT, ALKPHOS, BILITOT, PROT, ALBUMIN  in the last 168 hours. No results for input(s): LIPASE, AMYLASE in the last 168 hours. No results for input(s): AMMONIA in the last 168 hours. Coagulation Profile: No results for input(s): INR, PROTIME in the last 168 hours. Cardiac Enzymes: No results for input(s): CKTOTAL, CKMB, CKMBINDEX, TROPONINI in the last 168 hours. BNP (last 3 results) No results for input(s): PROBNP in the last 8760 hours. HbA1C: No results for input(s): HGBA1C in the last 72 hours. CBG: No results for input(s): GLUCAP in the last 168 hours. Lipid Profile: No results for input(s): CHOL, HDL, LDLCALC, TRIG, CHOLHDL, LDLDIRECT in the last 72 hours. Thyroid Function Tests: No results for input(s): TSH, T4TOTAL, FREET4, T3FREE, THYROIDAB in the last 72 hours. Anemia Panel: No results for input(s): VITAMINB12, FOLATE, FERRITIN, TIBC, IRON, RETICCTPCT in the last 72 hours. Sepsis Labs: No results for input(s): PROCALCITON, LATICACIDVEN in the last 168 hours.  No results found for this or any previous visit (from the past 240 hours).      Radiology Studies: No results found.       LOS: 310 days   Time spent= 35 mins    [name],  MD [facility]  If 7PM-7AM, please contact night-coverage  01/17/2024, 9:32 AM

## 2024-01-17 NOTE — Plan of Care (Signed)
  Problem: Safety: Goal: Non-violent Restraint(s) Outcome: Completed/Met   Problem: Education: Goal: Knowledge of the prescribed therapeutic regimen will improve Outcome: Completed/Met   Problem: Coping: Goal: Ability to identify and develop effective coping behavior will improve Outcome: Completed/Met   Problem: Clinical Measurements: Goal: Quality of life will improve Outcome: Completed/Met   Problem: Respiratory: Goal: Verbalizations of increased ease of respirations will increase Outcome: Completed/Met   Problem: Role Relationship: Goal: Family's ability to cope with current situation will improve Outcome: Completed/Met Goal: Ability to verbalize concerns, feelings, and thoughts to partner or family member will improve Outcome: Completed/Met   Problem: Pain Management: Goal: Satisfaction with pain management regimen will improve Outcome: Completed/Met

## 2024-01-18 NOTE — Plan of Care (Signed)
  Problem: Respiratory: Goal: Verbalizations of increased ease of respirations will increase Outcome: Progressing   

## 2024-01-18 NOTE — Plan of Care (Signed)
  Problem: Education: Goal: Knowledge of the prescribed therapeutic regimen will improve Outcome: Progressing   Problem: Coping: Goal: Ability to identify and develop effective coping behavior will improve Outcome: Progressing   Problem: Clinical Measurements: Goal: Quality of life will improve Outcome: Progressing   Problem: Respiratory: Goal: Verbalizations of increased ease of respirations will increase Outcome: Progressing   Problem: Role Relationship: Goal: Family's ability to cope with current situation will improve Outcome: Progressing Goal: Ability to verbalize concerns, feelings, and thoughts to partner or family member will improve Outcome: Progressing   Problem: Pain Management: Goal: Satisfaction with pain management regimen will improve Outcome: Progressing   

## 2024-01-18 NOTE — Progress Notes (Signed)
 PROGRESS NOTE    Natalie Brown  FMW:[id number] DOB: 29-Jan-1966 DOA: 01/22/2023 PCP: [name], NP   Brief Narrative:    58 y.o. female history significant for early onset dementia, bipolar disorder, catatonia essential hypertension sinus tachycardia B12 deficiency severe protein caloric malnutrition he was in the hospital for more than 400 days discharged on 01/22/2023 only to come back to the ED after 49 days due to agitation.  While in the ED evaluated by psychiatry who made changes to his medication.  Patient is currently a DNR/DNI she was evaluated by psych and neurology with no reversible treatable causes.  Her hospital course has been complicated by renal's malnutrition failure to thrive .  She was recently placed to memory care unit on October 2024 upon arrival the patient examined agitation so was denied entry and sent back to the hospital.  Patient has remained hospitalized since then, she is nonverbal does not follow commands can ambulate with assistance.  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 Continue Zyprexa , Ativan , hydroxyzine  and Haldol  as needed. Palliative care is following, she is currently a DNR/DNI. Patient unlikely to improve back to baseline per neurology. Further management per psychiatry. Patient was sent to a memory care unit and sent back due to restlessness. Valium  and trazodone  as needed. Now, she is full comfort.   Full comfort care: Continue symptomatic management.   Hypovolemic hypernatremia: Noted.   Weight loss/severe  protein caloric malnutrition.: Continue diet and iron supplementation.   Sinus tachycardia Likely due to agitation, now it has improved    Essential hypertension Well-controlled off antihypertensive medication, continue to monitor.   DVT prophylaxis: Not indicated     Code Status: Limited: Do not attempt resuscitation (DNR) -DNR-LIMITED -Do Not Intubate/DNI  Family Communication:  None at the bedside Status is: Inpatient Remains inpatient appropriate because: pending placement    Subjective:  No acute events overnight. Lying in bed comfortably. Non-communicative  Examination:  General exam: Appears calm and comfortable  Respiratory system: Clear to auscultation. Respiratory effort normal. Cardiovascular system: S1 & S2 heard, RRR. No JVD, murmurs, rubs, gallops or clicks. No pedal edema. Gastrointestinal system: Abdomen is nondistended, soft and nontender. No organomegaly or masses felt. Normal bowel sounds heard. Central nervous system: No focal neurological deficits. Extremities: Symmetric 5 x 5 power. Skin: No rashes, lesions or ulcers       Diet Orders (From admission, onward)     Start     Ordered   03/13/23 1330  Diet regular Room service appropriate? Yes; Fluid consistency: Thin  Diet effective now       Question Answer Comment  Room service appropriate? Yes   Fluid consistency: Thin      03/13/23 1330            Objective: Vitals:   01/18/24 0200 01/18/24 0358 01/18/24 0600 01/18/24 0901  BP:    (!) 86/72  Pulse:      Resp: 16 18 16    Temp:      TempSrc:      SpO2:      Height:  No intake or output data in the 24 hours ending 01/18/24 0905  Filed Weights    Scheduled Meds:  feeding supplement  237 mL Oral BID BM   LORazepam   1.5 mg Oral TID   Continuous Infusions:  Nutritional status Signs/Symptoms: severe fat depletion, severe muscle depletion, energy intake < or equal to 50% for > or equal to 5 days Interventions: Magic  cup Body mass index is 14.23 kg/m.  Data Reviewed:   CBC: No results for input(s): WBC, NEUTROABS, HGB, HCT, MCV, PLT in the last 168 hours. Basic Metabolic Panel: No results for input(s): NA, K, CL, CO2, GLUCOSE, BUN, CREATININE, CALCIUM , MG, PHOS in the last 168 hours. GFR: CrCl cannot be calculated (Patient's most recent lab result is older than the maximum 21 days allowed.). Liver Function Tests: No results for input(s): AST, ALT, ALKPHOS, BILITOT, PROT, ALBUMIN  in the last 168 hours. No results for input(s): LIPASE, AMYLASE in the last 168 hours. No results for input(s): AMMONIA in the last 168 hours. Coagulation Profile: No results for input(s): INR, PROTIME in the last 168 hours. Cardiac Enzymes: No results for input(s): CKTOTAL, CKMB, CKMBINDEX, TROPONINI in the last 168 hours. BNP (last 3 results) No results for input(s): PROBNP in the last 8760 hours. HbA1C: No results for input(s): HGBA1C in the last 72 hours. CBG: No results for input(s): GLUCAP in the last 168 hours. Lipid Profile: No results for input(s): CHOL, HDL, LDLCALC, TRIG, CHOLHDL, LDLDIRECT in the last 72 hours. Thyroid Function Tests: No results for input(s): TSH, T4TOTAL, FREET4, T3FREE, THYROIDAB in the last 72 hours. Anemia Panel: No results for input(s): VITAMINB12, FOLATE, FERRITIN, TIBC, IRON, RETICCTPCT in the last 72 hours. Sepsis Labs: No results for input(s): PROCALCITON, LATICACIDVEN in the last 168 hours.  No results found for this or any previous visit (from the past 240 hours).      Radiology Studies: No results found.       LOS: 311 days   Time spent= 35 mins    [name], MD [facility]  If 7PM-7AM, please contact night-coverage  01/18/2024, 9:05 AM

## 2024-01-19 MED ORDER — MEGESTROL ACETATE 40 MG PO TABS
40.0000 mg | ORAL_TABLET | Freq: Every day | ORAL | Status: DC
  Filled 2024-01-19: qty 1

## 2024-01-19 MED ORDER — SODIUM CHLORIDE 0.9 % IV SOLN
INTRAVENOUS | Status: AC
Start: 2024-01-19 — End: 2024-01-20

## 2024-01-19 MED ORDER — MEGESTROL ACETATE 400 MG/10ML PO SUSP
400.0000 mg | Freq: Every day | ORAL | Status: DC
  Filled 2024-01-19 (×3): qty 10

## 2024-01-19 NOTE — Progress Notes (Signed)
 Patients sister came in around lunch time very demanding. States that we don't feed her sister. This nurse asked if I could assist her with anything and followed her into the room. As this nurse opened the bed the patients sister had put on gloves and scooped a serving of mashed potatoes with green peas into her hand. Nurse assisted the sleeping patient into a sitting position and asked the sister not to try and give her that food because she was only taking in ensure safely at this time. Sister then put some food in the patients mouth as she realized the progressive state that her sister was in and started to scream and cry while running to the restroom. This nurse then tried clearing the patients mouth so she would not chock omn the food. As the sister leaves the restroom she becomes hyper verbal with the nurse stating why are yall not feeding her, Why don't she have an IV in her arm, Why are yall not giving her fluids, [place] are not going to let my sister sit up here and just die. As soon as the nurse could get a word in she tried to explain to the patients sister that she is on comfort measures and is a DNR/DNI. The sister then called the mom whom is legal guardian of the patient and asked her why these things were put into place. She then started demanding that the mom come into the hospital to speak with the doctor to get the DNR/DNI reversed.  Then she gathered her purse and left with her spouse to whom witnessed this entire event. The mom arrived a short time later asking for the nurse and stated  I wouldn't have signed a paper like that if I knew what it ment, call the doctor I want to get this paper off of her. Doctor was notified and stated that he would be available to speak with the mom tomorrow. The mom stated that she would be back around 10 am on Monday and that time was relayed to the doctor as well.

## 2024-01-19 NOTE — Progress Notes (Addendum)
 PROGRESS NOTE    Natalie Brown  FMW:[id number] DOB: 1965/06/06 DOA: 01/22/2023 PCP: [name], NP   Brief Narrative:    58 y.o. female history significant for early onset dementia, bipolar disorder, catatonia essential hypertension sinus tachycardia B12 deficiency severe protein caloric malnutrition he was in the hospital for more than 400 days discharged on 01/22/2023 only to come back to the ED after 49 days due to agitation.  While in the ED evaluated by psychiatry who made changes to his medication.  Patient is currently a DNR/DNI she was evaluated by psych and neurology with no reversible treatable causes.  Her hospital course has been complicated by renal's malnutrition failure to thrive .  She was recently placed to memory care unit on October 2024 upon arrival the patient examined agitation so was denied entry and sent back to the hospital.  Patient has remained hospitalized since then, she is nonverbal does not follow commands can ambulate with assistance.  Palliative care is also on board, pt is in comfort care  9/29: Spoke to her mother at the bedside. She said that she would like her to have fluids. She showed me the comfort care form (signed on 8/24) which mentioned No fluids' and she wants to revoke that. She would like to have new DNR/comfort form.   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 Continue Zyprexa , Ativan , hydroxyzine  and Haldol  as needed. Palliative care is following, she is currently a DNR/DNI. Patient unlikely to improve back to baseline per neurology. Further management per  psychiatry. Patient was sent to a memory care unit and sent back due to restlessness. Valium  and trazodone  as needed. Now, she is full comfort.   Full comfort care: Continue symptomatic management.   Hypovolemic hypernatremia: Noted.   Weight loss/severe protein caloric malnutrition.: Continue diet and iron supplementation.   Sinus tachycardia Likely due to agitation, now it has improved  Dehydration: Mother wants to initiate IV fluids on 9/29.    Essential hypertension Well-controlled off antihypertensive medication, continue to monitor.   DVT prophylaxis: Not indicated     Code Status: Limited: Do not attempt resuscitation (DNR) -DNR-LIMITED -Do Not Intubate/DNI  Family Communication:  Mother at the bedside Status is: Inpatient Remains inpatient appropriate because: pending placement    Subjective:  No acute events overnight. Lying in bed comfortably. Non-communicative. Mother is present at the bedside.  Examination:  General exam: Appears drowsy, non-communicative Respiratory system: Clear to auscultation. Respiratory effort normal. Cardiovascular system: S1 & S2 heard, RRR. No JVD, murmurs, rubs, gallops or clicks. No pedal edema. Gastrointestinal system: Abdomen is nondistended, soft and nontender. No organomegaly or masses felt. Normal bowel sounds heard. Central nervous system: No focal neurological deficits. Extremities: Unable to assess strength as she is not responding to any commands Skin: No rashes, lesions or ulcers       Diet Orders (From admission, onward)     Start     Ordered   03/13/23 1330  Diet regular Room service appropriate? Yes; Fluid consistency: Thin  Diet effective now       Question Answer Comment  Room service appropriate? Yes   Fluid consistency: Thin  03/13/23 1330            Objective: Vitals:   01/18/24 2200 01/19/24 0000 01/19/24 0200 01/19/24 0600  BP: 96/72     Pulse: 60     Resp: 18 16 16 16   Temp: 98  F (36.7 C)     TempSrc: Axillary     SpO2: 100%     Height:        Intake/Output Summary (Last 24 hours) at 01/19/2024 1003 Last data filed at 01/19/2024 0931 Gross per 24 hour  Intake 0 ml  Output --  Net 0 ml    Filed Weights    Scheduled Meds:  feeding supplement  237 mL Oral BID BM   LORazepam   1.5 mg Oral TID   Continuous Infusions:  Nutritional status Signs/Symptoms: severe fat depletion, severe muscle depletion, energy intake < or equal to 50% for > or equal to 5 days Interventions: Magic cup Body mass index is 14.23 kg/m.  Data Reviewed:   CBC: No results for input(s): WBC, NEUTROABS, HGB, HCT, MCV, PLT in the last 168 hours. Basic Metabolic Panel: No results for input(s): NA, K, CL, CO2, GLUCOSE, BUN, CREATININE, CALCIUM , MG, PHOS in the last 168 hours. GFR: CrCl cannot be calculated (Patient's most recent lab result is older than the maximum 21 days allowed.). Liver Function Tests: No results for input(s): AST, ALT, ALKPHOS, BILITOT, PROT, ALBUMIN  in the last 168 hours. No results for input(s): LIPASE, AMYLASE in the last 168 hours. No results for input(s): AMMONIA in the last 168 hours. Coagulation Profile: No results for input(s): INR, PROTIME in the last 168 hours. Cardiac Enzymes: No results for input(s): CKTOTAL, CKMB, CKMBINDEX, TROPONINI in the last 168 hours. BNP (last 3 results) No results for input(s): PROBNP in the last 8760 hours. HbA1C: No results for input(s): HGBA1C in the last 72 hours. CBG: No results for input(s): GLUCAP in the last 168 hours. Lipid Profile: No results for input(s): CHOL, HDL, LDLCALC, TRIG, CHOLHDL, LDLDIRECT in the last 72 hours. Thyroid Function Tests: No results for input(s): TSH, T4TOTAL, FREET4, T3FREE, THYROIDAB in the last 72 hours. Anemia Panel: No results for input(s): VITAMINB12, FOLATE, FERRITIN, TIBC,  IRON, RETICCTPCT in the last 72 hours. Sepsis Labs: No results for input(s): PROCALCITON, LATICACIDVEN in the last 168 hours.  No results found for this or any previous visit (from the past 240 hours).      Radiology Studies: No results found.       LOS: 312 days   Time spent= 37 mins    [name], MD [facility]  If 7PM-7AM, please contact night-coverage  01/19/2024, 10:03 AM

## 2024-01-19 NOTE — Plan of Care (Signed)
  Problem: Respiratory: Goal: Verbalizations of increased ease of respirations will increase Outcome: Progressing   

## 2024-01-20 NOTE — Plan of Care (Signed)
°  Problem: Education: Goal: Knowledge of the prescribed therapeutic regimen will improve Outcome: Not Progressing   Problem: Coping: Goal: Ability to identify and develop effective coping behavior will improve Outcome: Not Progressing   Problem: Clinical Measurements: Goal: Quality of life will improve Outcome: Not Progressing   Problem: Respiratory: Goal: Verbalizations of increased ease of respirations will increase Outcome: Not Progressing   Problem: Role Relationship: Goal: Family's ability to cope with current situation will improve Outcome: Not Progressing Goal: Ability to verbalize concerns, feelings, and thoughts to partner or family member will improve Outcome: Not Progressing   Problem: Pain Management: Goal: Satisfaction with pain management regimen will improve Outcome: Not Progressing

## 2024-01-20 NOTE — Plan of Care (Signed)
  Problem: Education: Goal: Knowledge of the prescribed therapeutic regimen will improve Outcome: Not Progressing   Problem: Clinical Measurements: Goal: Quality of life will improve Outcome: Not Progressing   Problem: Clinical Measurements: Goal: Quality of life will improve Outcome: Not Progressing   Problem: Respiratory: Goal: Verbalizations of increased ease of respirations will increase Outcome: Not Progressing   Problem: Role Relationship: Goal: Family's ability to cope with current situation will improve Outcome: Not Progressing Goal: Ability to verbalize concerns, feelings, and thoughts to partner or family member will improve Outcome: Not Progressing   Problem: Pain Management: Goal: Satisfaction with pain management regimen will improve Outcome: Not Progressing

## 2024-01-20 NOTE — Progress Notes (Signed)
 PROGRESS NOTE    Natalie Brown  FMW:[id number] DOB: January 07, 1966 DOA: 01/22/2023 PCP: [name], NP   Brief Narrative:    58 y.o. female history significant for early onset dementia, bipolar disorder, catatonia essential hypertension sinus tachycardia B12 deficiency severe protein caloric malnutrition he was in the hospital for more than 400 days discharged on 01/22/2023 only to come back to the ED after 49 days due to agitation.  While in the ED evaluated by psychiatry who made changes to his medication.  Patient is currently a DNR/DNI she was evaluated by psych and neurology with no reversible treatable causes.  Her hospital course has been complicated by renal's malnutrition failure to thrive .  She was recently placed to memory care unit on October 2024 upon arrival the patient examined agitation so was denied entry and sent back to the hospital.  Patient has remained hospitalized since then, she is nonverbal does not follow commands can ambulate with assistance.  Palliative care is also on board, pt is in comfort care  9/29: Spoke to her mother at the bedside. She said that she would like her to have fluids. She showed me the comfort care form (signed on 8/24) which mentioned No fluids' and she wants to revoke that. She would like to have new DNR/comfort form.   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 Continue Zyprexa , Ativan , hydroxyzine  and Haldol  as needed. Palliative care is following, she is currently a DNR/DNI. Patient unlikely to improve back to baseline per neurology. Further management per  psychiatry. Patient was sent to a memory care unit and sent back due to restlessness. Valium  and trazodone  as needed.    Full comfort care: Continue symptomatic management. IVF as per patient's mother's request   Hypovolemic hypernatremia: Noted.   Weight loss/severe protein caloric malnutrition.: Continue diet and iron supplementation.   Sinus tachycardia Likely due to agitation, now it has improved  Dehydration: Mother wants to initiate IV fluids on 9/29.    Essential hypertension Well-controlled off antihypertensive medication, continue to monitor.   DVT prophylaxis: Not indicated     Code Status: Limited: Do not attempt resuscitation (DNR) -DNR-LIMITED -Do Not Intubate/DNI  Family Communication:  None at the bedside Status is: Inpatient Remains inpatient appropriate because: pending placement    Subjective:  No acute events overnight. Lying in bed comfortably. Non-communicative.   Examination:  General exam: Appears drowsy, non-communicative Respiratory system: Clear to auscultation. Respiratory effort normal. Cardiovascular system: S1 & S2 heard, RRR. No JVD, murmurs, rubs, gallops or clicks. No pedal edema. Gastrointestinal system: Abdomen is nondistended, soft and nontender. No organomegaly or masses felt. Normal bowel sounds heard. Central nervous system: No focal neurological deficits. Extremities: Unable to assess strength as she is not responding to any commands Skin: No rashes, lesions or ulcers       Diet Orders (From admission, onward)     Start     Ordered   03/13/23 1330  Diet regular Room service appropriate? Yes; Fluid consistency: Thin  Diet effective now       Question Answer Comment  Room service appropriate? Yes   Fluid consistency: Thin  03/13/23 1330            Objective: Vitals:   01/19/24 0200 01/19/24 0600 01/19/24 1603 01/20/24 0744  BP:   92/70 94/73  Pulse:   (!) 53 90  Resp: 16 16 16 16   Temp:    98.1 F  (36.7 C)  TempSrc:      SpO2:   100%   Height:        Intake/Output Summary (Last 24 hours) at 01/20/2024 1026 Last data filed at 01/19/2024 1230 Gross per 24 hour  Intake 0 ml  Output --  Net 0 ml    Filed Weights    Scheduled Meds:  feeding supplement  237 mL Oral BID BM   LORazepam   1.5 mg Oral TID   megestrol  400 mg Oral Daily   Continuous Infusions:  sodium chloride  75 mL/hr at 01/20/24 0146    Nutritional status Signs/Symptoms: severe fat depletion, severe muscle depletion, energy intake < or equal to 50% for > or equal to 5 days Interventions: Magic cup Body mass index is 14.23 kg/m.  Data Reviewed:   CBC: No results for input(s): WBC, NEUTROABS, HGB, HCT, MCV, PLT in the last 168 hours. Basic Metabolic Panel: No results for input(s): NA, K, CL, CO2, GLUCOSE, BUN, CREATININE, CALCIUM , MG, PHOS in the last 168 hours. GFR: CrCl cannot be calculated (Patient's most recent lab result is older than the maximum 21 days allowed.). Liver Function Tests: No results for input(s): AST, ALT, ALKPHOS, BILITOT, PROT, ALBUMIN  in the last 168 hours. No results for input(s): LIPASE, AMYLASE in the last 168 hours. No results for input(s): AMMONIA in the last 168 hours. Coagulation Profile: No results for input(s): INR, PROTIME in the last 168 hours. Cardiac Enzymes: No results for input(s): CKTOTAL, CKMB, CKMBINDEX, TROPONINI in the last 168 hours. BNP (last 3 results) No results for input(s): PROBNP in the last 8760 hours. HbA1C: No results for input(s): HGBA1C in the last 72 hours. CBG: No results for input(s): GLUCAP in the last 168 hours. Lipid Profile: No results for input(s): CHOL, HDL, LDLCALC, TRIG, CHOLHDL, LDLDIRECT in the last 72 hours. Thyroid Function Tests: No results for input(s): TSH, T4TOTAL, FREET4, T3FREE, THYROIDAB in the last 72 hours. Anemia Panel: No results  for input(s): VITAMINB12, FOLATE, FERRITIN, TIBC, IRON, RETICCTPCT in the last 72 hours. Sepsis Labs: No results for input(s): PROCALCITON, LATICACIDVEN in the last 168 hours.  No results found for this or any previous visit (from the past 240 hours).      Radiology Studies: No results found.       LOS: 313 days   Time spent= 35 mins    [name], MD [facility]  If 7PM-7AM, please contact night-coverage  01/20/2024, 10:26 AM

## 2024-01-22 NOTE — Progress Notes (Signed)
 Post mortem care done , pt sent to morgue .

## 2024-01-22 NOTE — Progress Notes (Signed)
 Contacted Medical examiner regarding patient and spoke to [employer] (ME on call) who confirmed that patient would not be an medical examiner case despite being recently restrained in enclosure bed.

## 2024-01-22 NOTE — Progress Notes (Signed)
 Late entry for 01/28/2024  19:25 pm

## 2024-02-21 NOTE — Accreditation Note (Signed)
 Restraint death within 24 hours of removal of enclosure bed report to be filed with CMS logged on 01/28/2024 at 1348 by Valentin Lai RN

## 2024-02-21 NOTE — Progress Notes (Addendum)
 Pt developed ,cardiopulmonary arrest, no pulse , no BP ,no breathing , fixed dialted pupil no reacted with light,pr DNR limited comfort care , pt pronounced with Dayla RN , dr redia md informed , family mom informed , [facility]  + bed placement ,charge nurse , house supervisor informed

## 2024-02-21 NOTE — Plan of Care (Signed)
°  Problem: Education: Goal: Knowledge of the prescribed therapeutic regimen will improve Outcome: Not Progressing   Problem: Coping: Goal: Ability to identify and develop effective coping behavior will improve Outcome: Not Progressing   Problem: Clinical Measurements: Goal: Quality of life will improve Outcome: Not Progressing   Problem: Respiratory: Goal: Verbalizations of increased ease of respirations will increase Outcome: Not Progressing   Problem: Role Relationship: Goal: Family's ability to cope with current situation will improve Outcome: Not Progressing Goal: Ability to verbalize concerns, feelings, and thoughts to partner or family member will improve Outcome: Not Progressing   Problem: Pain Management: Goal: Satisfaction with pain management regimen will improve Outcome: Not Progressing

## 2024-02-21 NOTE — Progress Notes (Signed)
 PROGRESS NOTE    Natalie Brown  FMW:[id number] DOB: 1965/06/24 DOA: 01/22/2023 PCP: Natalie Ronal Caldron, NP   Brief Narrative:   58 y.o. F with bipolar disorder and early onset dementia who was admitted for failure to thrive .   This is Natalie Brown long and complicated hospitalization spanning 2 years.  For details, see Dr Marysue note summary from 12/17/22, Dr. Sherian summary from 01/22/23, or Dr. Kathaleen summary from 06/22/23.   In brief, she was admitted in 2023 for increasingly erratic behavior and cognitive impairment.  She was evaluated by Neurology and Psychiatry, but no treatable or reversible cause of her impairment could be found.  It appears she has an early onset dementia with comorbid bipolar disorder, leading to weight loss, malnutrition and failure to thrive .   For some reason, during that admission, there was Natalie Brown prolonged delay for over Other Atienza year in placement in memory care until October 2024, and on arriving at that facility, she exhibited agitation behaviors, and so she was denied entry and sent back to the hospital the same day.     She remained in ER holding for 6 weeks until she was transferred upstairs in Nov 2024 and has been on [facility] since.    [name] nonverbal and does not follow commands. She could ambulate with assistance, but has declined over the past few weeks.    Currently has increased work of breathing and isn't eating or drinking or taking meds.     [name] comfort care.   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  [name] looks worse today when discussing with RN's.  She's tachypneic with increased work of breathing.  Sounds like she  started to decline 1-2 weeks ago.  Last documented PO was 9/27.  Her mother expresses surprise that she's declined so rapidly, notes she was walking Natalie Brown few weeks ago.  Discussed difficult to say exactly when she started to decline over past few weeks and what exactly led to her decline.  Her suspected dementia likely played [name] significant role, but differential remains broad.  She wasn't happy with the MOST form, didn't think she would check the box not to give IV fluids.  I discussed that our goal during most of this hospitalization has been comfort.  Recommended considering comfort meds like morphine - she's not quite ready, is going to think about this.  She understands with her daughter's change in clinical status that she's approaching the end of life, though difficult to predict how much time someone has left.  Will continue to try to prioritize comfort.  Will continue to discuss comfort meds like morphine as I think this will be needed at some time soon.       Early onset Dementia with behavioral disturbance Bipolar Disorder 03/16/2023 - neuro note - presentation thought due to progression of underlying neurodegenerative process that has manifested itself as early onset dementia and probably other psychiatric disorders - at that time they recommended management of behavioral symptoms per psychiatry and checking for reversible causes of dementia B12 elevated, RPR non reactive Ativan , trazodone , valium  prn  Full comfort care: Continue symptomatic management.  DVT prophylaxis: Not indicated     Code Status: Limited: Do not attempt resuscitation (DNR) -DNR-LIMITED -Do Not Intubate/DNI  Family Communication:  None at the bedside Status is: Inpatient Remains inpatient appropriate because: pending placement    Subjective:  Nonverbal Discussed with mother  Examination:  General exam: cachectic, ill appearing Respiratory system: tachypneic, increased wob Cardiovascular system: RRR Central  nervous system: nonverbal, no meaningful itneraction      Diet Orders (From admission, onward)     Start     Ordered   03/13/23 1330  Diet regular Room service appropriate? Yes; Fluid consistency: Thin  Diet effective now       Question Answer Comment  Room service appropriate? Yes   Fluid consistency: Thin      03/13/23 1330            Objective: Vitals:   01/20/24 0744 01/20/24 2101 01/20/24 2103 02/16/2024 0627  BP: 94/73 106/74 106/74 106/82  Pulse: 90 (!) 26 80 (!) 103  Resp: 16     Temp: 98.1 F (36.7 C) 98.3 F (36.8 C) 98.3 F (36.8 C)   TempSrc:      SpO2:   (!) 72% 99%  Height:       No intake or output data in the 24 hours ending 02/20/2024 1907   Filed Weights    Scheduled Meds:  feeding supplement  237 mL Oral BID BM   LORazepam   1.5 mg Oral TID   megestrol  400 mg Oral Daily   Continuous Infusions:    Nutritional status Signs/Symptoms: severe fat depletion, severe muscle depletion, energy intake < or equal to 50% for > or equal to 5 days Interventions: Magic cup Body mass index is 14.23 kg/m.  Data Reviewed:   CBC: No results for input(s): WBC, NEUTROABS, HGB, HCT, MCV, PLT in the last 168 hours. Basic Metabolic Panel: No results for input(s): NA, K, CL, CO2, GLUCOSE, BUN, CREATININE, CALCIUM , MG, PHOS in the last 168 hours. GFR: CrCl cannot be calculated (Patient's most recent lab result is older than the maximum 21 days allowed.). Liver Function Tests: No results for input(s): AST, ALT, ALKPHOS, BILITOT, PROT, ALBUMIN  in the last 168 hours. No results for input(s): LIPASE, AMYLASE in the last 168 hours. No results for input(s): AMMONIA in the last 168 hours. Coagulation Profile: No results for input(s): INR, PROTIME in the last 168 hours. Cardiac Enzymes: No results for input(s): CKTOTAL, CKMB, CKMBINDEX, TROPONINI in the last 168 hours. BNP (last 3 results) No results  for input(s): PROBNP in the last 8760 hours. HbA1C: No results for input(s): HGBA1C in the last 72 hours. CBG: No results for input(s): GLUCAP in the last 168 hours. Lipid Profile: No results for input(s): CHOL, HDL, LDLCALC, TRIG, CHOLHDL, LDLDIRECT in the last 72 hours. Thyroid Function Tests: No results for input(s): TSH, T4TOTAL, FREET4, T3FREE, THYROIDAB in the last 72 hours. Anemia Panel: No results for input(s): VITAMINB12, FOLATE, FERRITIN, TIBC, IRON, RETICCTPCT in the last 72 hours. Sepsis Labs: No results for input(s): PROCALCITON, LATICACIDVEN in the last 168 hours.  No results found for this or any previous visit (from the past 240 hours).      Radiology Studies: No results found.       LOS: 314 days   Time spent= 35 mins    Meliton Monte, MD [facility]  If 7PM-7AM, please contact night-coverage  02/12/2024, 7:07 PM

## 2024-02-21 NOTE — Death Summary Note (Signed)
   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 F with bipolar disorder and early onset dementia who was admitted for failure to thrive .   This is Daney Moor long and complicated hospitalization spanning 2 years.  For details, see Dr Marysue note summary from 12/17/22, Dr. Sherian summary from 2023-01-25, or Dr. Kathaleen summary from 06/22/23.   In brief, she was admitted in 28-Jan-2022 for increasingly erratic behavior and cognitive impairment.  She was evaluated by Neurology and Psychiatry, but no treatable or reversible cause of her impairment could be found.  It appears she has an early onset dementia with comorbid bipolar disorder, leading to weight loss, malnutrition and failure to thrive .   For some reason, during that admission, there was Nicklaus Alviar prolonged delay for over Tyger Wichman year in placement in memory care until 10/09/2024and on arriving at that facility, she exhibited agitation behaviors, and so she was denied entry and sent back to the hospital the same day.     She remained in ER holding for 6 weeks until she was transferred upstairs in Nov 2024 and has been on [facility] since.     [name] nonverbal and does not follow commands. She could ambulate with assistance, but has declined over the past few weeks.  When I saw her 10/1 she had increased work of breathing and was no longer taking PO.    Reviewed plan for comfort with her  mother who was understandably upset with the imminent passing of her daughter, but was in agreement with our plan (though she hadn't agreed to medicine like morphine at the time of our conversation).  She passed on evening of 10/1 PM.  I followed up with [name] (mother) to offer condolences and answer questions.    See previous notes and below for additional details Assessment and Plan:  Early onset Dementia with behavioral disturbance Bipolar Disorder 03/16/2023 - neuro note - presentation thought due to progression of underlying neurodegenerative process that has manifested itself as early onset dementia and probably other psychiatric disorders - at that time they recommended management of behavioral symptoms per psychiatry and checking for reversible causes of dementia B12 elevated, RPR non reactive Ativan , trazodone , valium  prn   Full comfort care: Continue symptomatic management. Last note from palliative care was on 12/8       Procedures: see prior notes  Consultations: see prior notes  The results of significant diagnostics from this hospitalization (including imaging, microbiology, ancillary and laboratory) are listed below for reference.   Significant Diagnostic Studies: No results found.  Microbiology: No results found for this or any previous visit (from the past 240 hours).  Time spent: 30 minutes  Signed: Meliton Monte, MD 25-Jan-2024

## 2024-02-21 DEATH — deceased

## 2024-03-15 IMAGING — DX DG CHEST 2V
2 series · 2 of 2 positions shown · non-contrast
Comparison: No pertinent prior exams available for comparison.

CLINICAL DATA: Provided history: Chest pain. Additional history
provided: Shortness of breath. Smoker. History of hypertension.

EXAM:
CHEST - 2 VIEW

[chest pa]
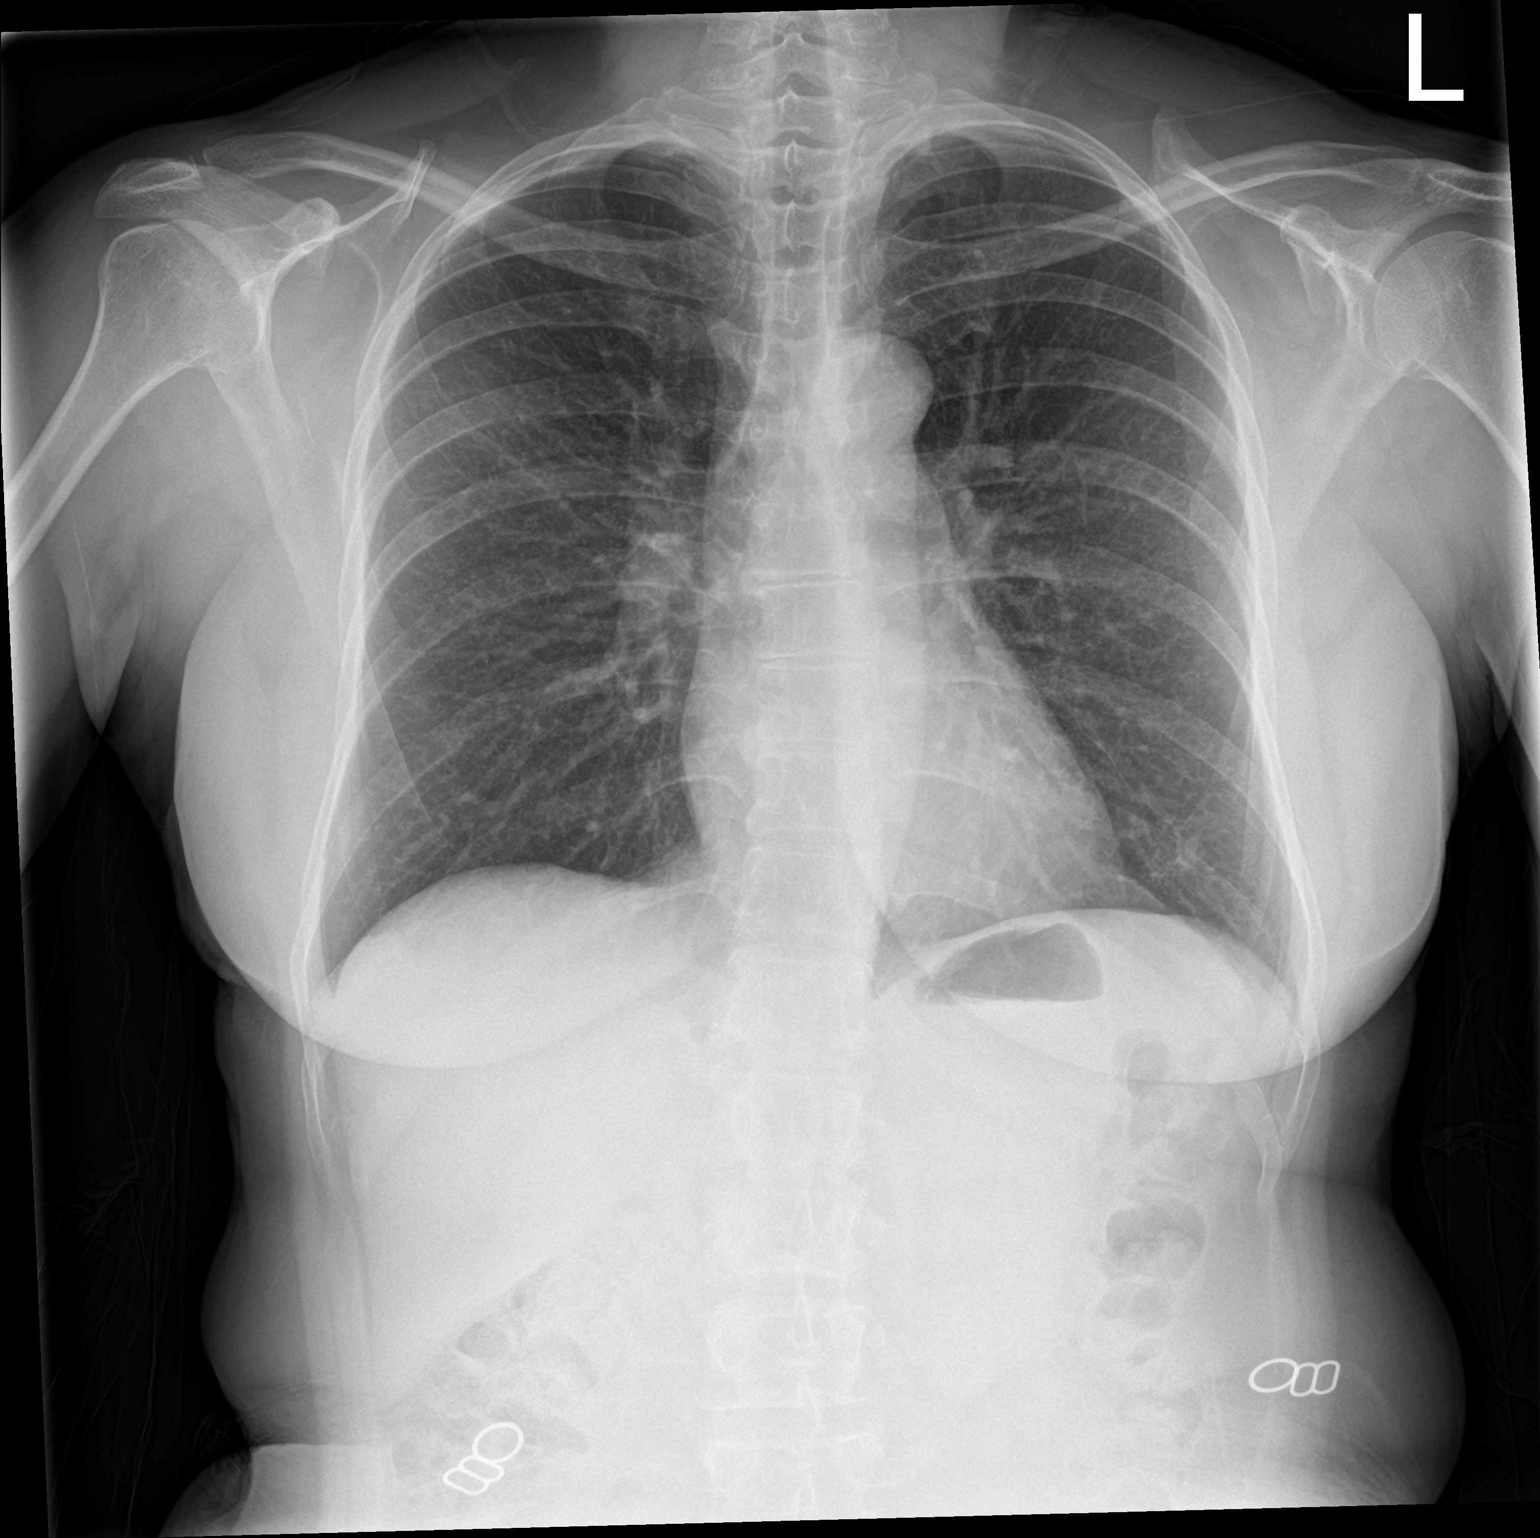

[chest lat]
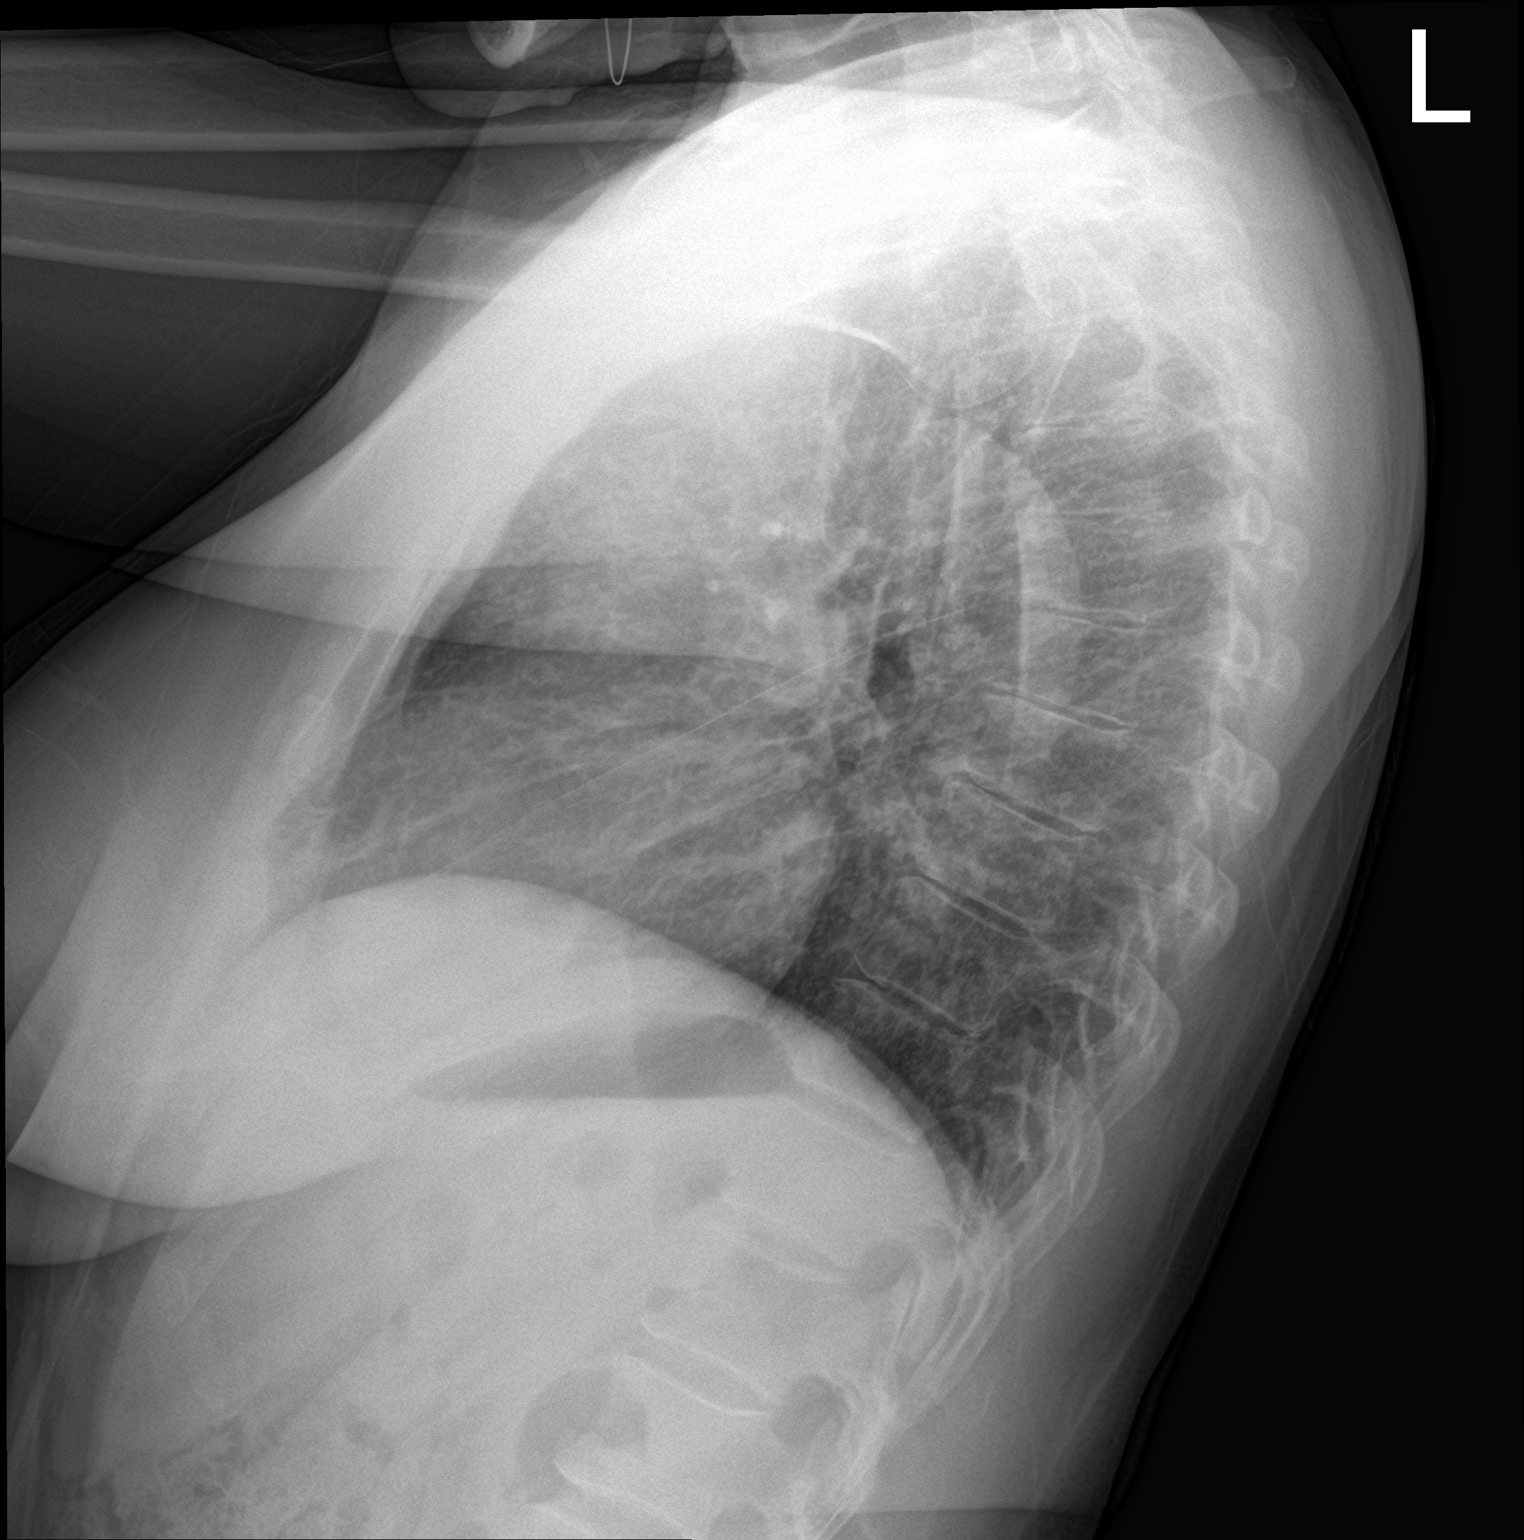

[2 of 2 positions shown; findings below may reference images not displayed]

FINDINGS: Heart size within normal limits. No appreciable airspace
consolidation. A nipple shadow projects over the left lung base. No
evidence of pleural effusion or pneumothorax. No acute bony
abnormality identified. Mild thoracic levocurvature.
IMPRESSION: No evidence of active cardiopulmonary disease.

Mild thoracic levocurvature.
